# Patient Record
Sex: Female | Born: 1955 | Race: White | Hispanic: No | Marital: Married | Smoking: Former smoker
Health system: Southern US, Community
[De-identification: ages and names within clinical notes are randomized; demographics above are authoritative.]

## PROBLEM LIST (undated history)

## (undated) ENCOUNTER — Ambulatory Visit: Admission: EM | Payer: Medicare HMO | Source: Home / Self Care

## (undated) DIAGNOSIS — M5030 Other cervical disc degeneration, unspecified cervical region: Secondary | ICD-10-CM

## (undated) DIAGNOSIS — F32A Depression, unspecified: Secondary | ICD-10-CM

## (undated) DIAGNOSIS — I509 Heart failure, unspecified: Secondary | ICD-10-CM

## (undated) DIAGNOSIS — R1084 Generalized abdominal pain: Secondary | ICD-10-CM

## (undated) DIAGNOSIS — Z720 Tobacco use: Secondary | ICD-10-CM

## (undated) DIAGNOSIS — G5600 Carpal tunnel syndrome, unspecified upper limb: Secondary | ICD-10-CM

## (undated) DIAGNOSIS — I4891 Unspecified atrial fibrillation: Secondary | ICD-10-CM

## (undated) DIAGNOSIS — M5416 Radiculopathy, lumbar region: Secondary | ICD-10-CM

## (undated) DIAGNOSIS — N281 Cyst of kidney, acquired: Secondary | ICD-10-CM

## (undated) DIAGNOSIS — J310 Chronic rhinitis: Secondary | ICD-10-CM

## (undated) DIAGNOSIS — G8929 Other chronic pain: Secondary | ICD-10-CM

## (undated) DIAGNOSIS — J449 Chronic obstructive pulmonary disease, unspecified: Secondary | ICD-10-CM

## (undated) DIAGNOSIS — K219 Gastro-esophageal reflux disease without esophagitis: Secondary | ICD-10-CM

## (undated) DIAGNOSIS — M51379 Other intervertebral disc degeneration, lumbosacral region without mention of lumbar back pain or lower extremity pain: Secondary | ICD-10-CM

## (undated) DIAGNOSIS — K469 Unspecified abdominal hernia without obstruction or gangrene: Secondary | ICD-10-CM

## (undated) DIAGNOSIS — M545 Low back pain, unspecified: Secondary | ICD-10-CM

## (undated) DIAGNOSIS — R609 Edema, unspecified: Secondary | ICD-10-CM

## (undated) DIAGNOSIS — F419 Anxiety disorder, unspecified: Secondary | ICD-10-CM

## (undated) DIAGNOSIS — M6281 Muscle weakness (generalized): Secondary | ICD-10-CM

## (undated) DIAGNOSIS — J111 Influenza due to unidentified influenza virus with other respiratory manifestations: Secondary | ICD-10-CM

## (undated) DIAGNOSIS — F329 Major depressive disorder, single episode, unspecified: Secondary | ICD-10-CM

## (undated) DIAGNOSIS — K2970 Gastritis, unspecified, without bleeding: Secondary | ICD-10-CM

## (undated) DIAGNOSIS — R8290 Unspecified abnormal findings in urine: Secondary | ICD-10-CM

## (undated) DIAGNOSIS — N3941 Urge incontinence: Secondary | ICD-10-CM

## (undated) DIAGNOSIS — E039 Hypothyroidism, unspecified: Secondary | ICD-10-CM

## (undated) DIAGNOSIS — I10 Essential (primary) hypertension: Secondary | ICD-10-CM

## (undated) DIAGNOSIS — R062 Wheezing: Secondary | ICD-10-CM

## (undated) DIAGNOSIS — E669 Obesity, unspecified: Secondary | ICD-10-CM

## (undated) DIAGNOSIS — N200 Calculus of kidney: Secondary | ICD-10-CM

## (undated) DIAGNOSIS — R319 Hematuria, unspecified: Secondary | ICD-10-CM

## (undated) DIAGNOSIS — G8918 Other acute postprocedural pain: Secondary | ICD-10-CM

## (undated) DIAGNOSIS — D649 Anemia, unspecified: Secondary | ICD-10-CM

## (undated) DIAGNOSIS — Z9989 Dependence on other enabling machines and devices: Secondary | ICD-10-CM

## (undated) DIAGNOSIS — R0600 Dyspnea, unspecified: Secondary | ICD-10-CM

## (undated) DIAGNOSIS — K439 Ventral hernia without obstruction or gangrene: Secondary | ICD-10-CM

## (undated) DIAGNOSIS — M5137 Other intervertebral disc degeneration, lumbosacral region: Secondary | ICD-10-CM

## (undated) DIAGNOSIS — Z9981 Dependence on supplemental oxygen: Secondary | ICD-10-CM

## (undated) HISTORY — DX: Unspecified abdominal hernia without obstruction or gangrene: K46.9

## (undated) HISTORY — DX: Chronic rhinitis: J31.0

## (undated) HISTORY — DX: Other cervical disc degeneration, unspecified cervical region: M50.30

## (undated) HISTORY — DX: Major depressive disorder, single episode, unspecified: F32.9

## (undated) HISTORY — DX: Tobacco use: Z72.0

## (undated) HISTORY — DX: Hematuria, unspecified: R31.9

## (undated) HISTORY — DX: Influenza due to unidentified influenza virus with other respiratory manifestations: J11.1

## (undated) HISTORY — DX: Anxiety disorder, unspecified: F41.9

## (undated) HISTORY — DX: Urge incontinence: N39.41

## (undated) HISTORY — DX: Unspecified abnormal findings in urine: R82.90

## (undated) HISTORY — DX: Hypothyroidism, unspecified: E03.9

## (undated) HISTORY — DX: Low back pain, unspecified: M54.50

## (undated) HISTORY — PX: OTHER SURGICAL HISTORY: SHX169

## (undated) HISTORY — DX: Generalized abdominal pain: R10.84

## (undated) HISTORY — DX: Radiculopathy, lumbar region: M54.16

## (undated) HISTORY — DX: Obesity, unspecified: E66.9

## (undated) HISTORY — PX: ABDOMINAL HYSTERECTOMY: SHX81

## (undated) HISTORY — DX: Anemia, unspecified: D64.9

## (undated) HISTORY — DX: Edema, unspecified: R60.9

## (undated) HISTORY — DX: Depression, unspecified: F32.A

## (undated) HISTORY — PX: TONSILLECTOMY: SUR1361

## (undated) HISTORY — DX: Dependence on supplemental oxygen: Z99.81

## (undated) HISTORY — DX: Other acute postprocedural pain: G89.18

## (undated) HISTORY — DX: Cyst of kidney, acquired: N28.1

## (undated) HISTORY — DX: Gastritis, unspecified, without bleeding: K29.70

## (undated) HISTORY — DX: Carpal tunnel syndrome, unspecified upper limb: G56.00

## (undated) HISTORY — DX: Calculus of kidney: N20.0

## (undated) HISTORY — DX: Muscle weakness (generalized): M62.81

## (undated) HISTORY — DX: Other chronic pain: G89.29

## (undated) HISTORY — DX: Low back pain: M54.5

## (undated) HISTORY — PX: TUBAL LIGATION: SHX77

## (undated) HISTORY — DX: Ventral hernia without obstruction or gangrene: K43.9

---

## 2006-02-02 ENCOUNTER — Ambulatory Visit: Payer: Self-pay | Admitting: Obstetrics and Gynecology

## 2006-02-09 ENCOUNTER — Ambulatory Visit: Payer: Self-pay | Admitting: Obstetrics and Gynecology

## 2006-02-23 ENCOUNTER — Ambulatory Visit: Payer: Self-pay | Admitting: Obstetrics and Gynecology

## 2006-06-13 ENCOUNTER — Emergency Department: Payer: Self-pay | Admitting: Emergency Medicine

## 2006-08-24 IMAGING — US ULTRASOUND LEFT BREAST
1 series · 17 of 19 positions shown · non-contrast
Comparison: none

REASON FOR EXAM: Density  US PRN
COMMENTS:

[Series 1: ultrasound left breast · 17 of 19 slices shown]
[im 1/19]
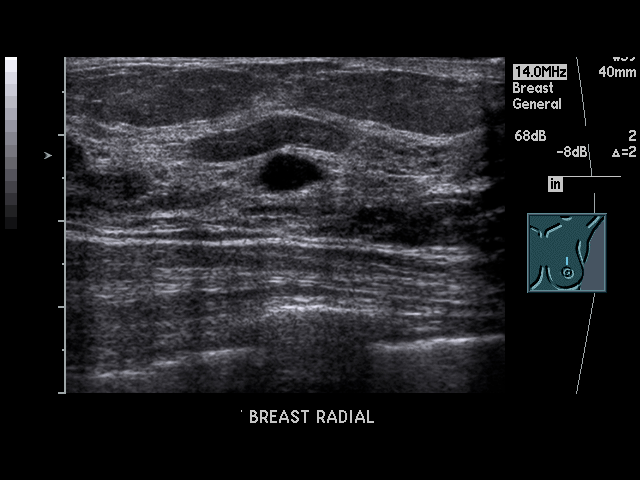
[im 2/19]
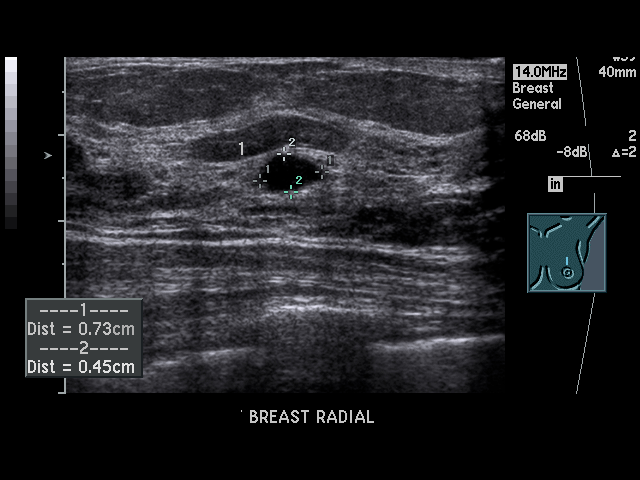
[im 3/19]
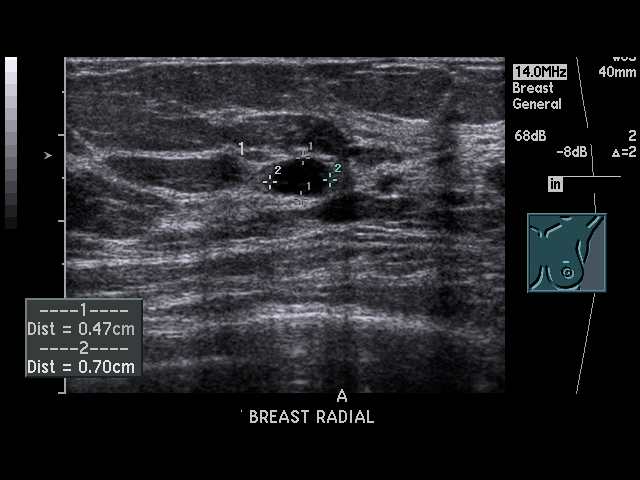
[im 4/19]
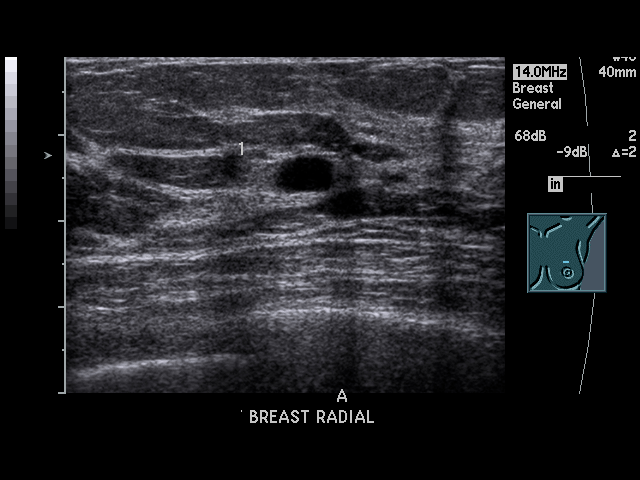
[im 6/19]
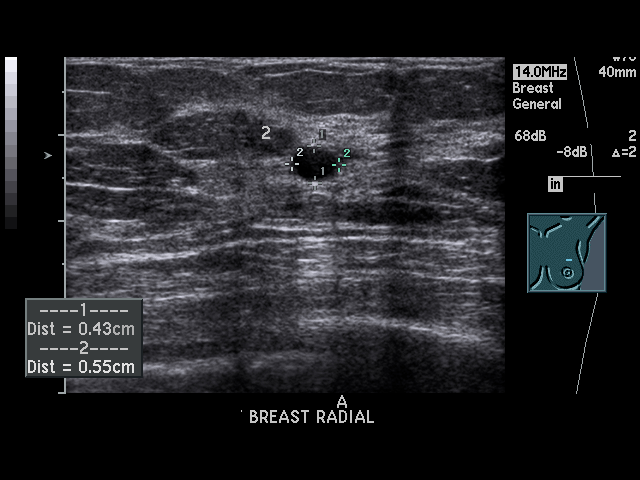
[im 7/19]
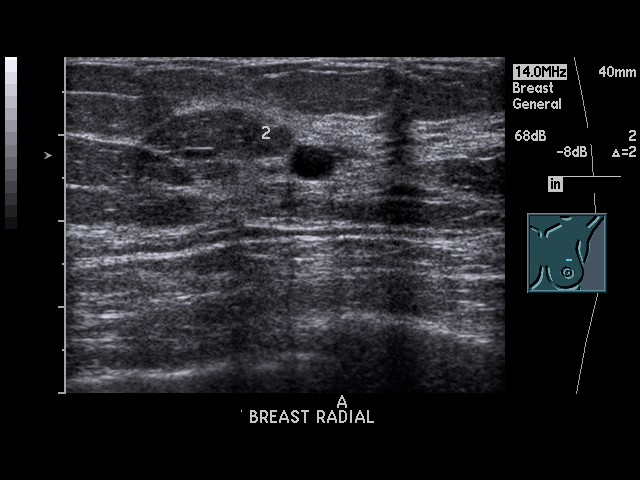
[im 8/19]
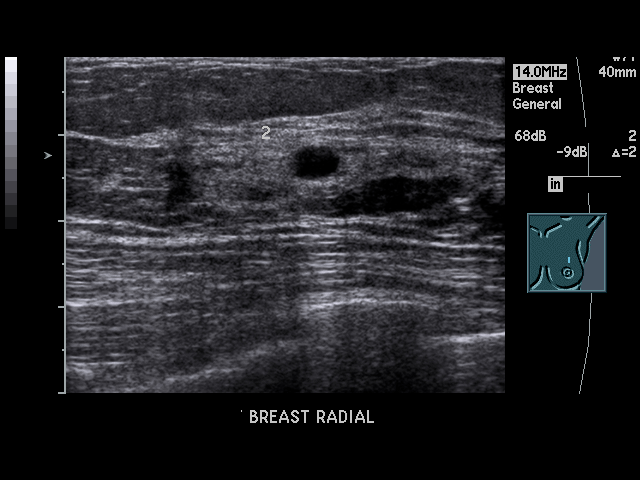
[im 9/19]
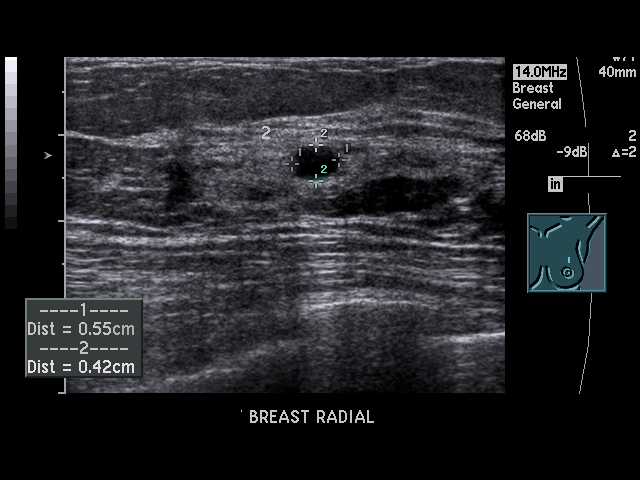
[im 10/19]
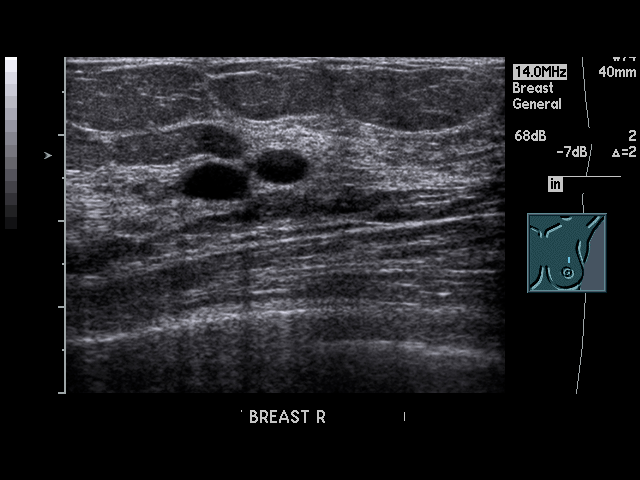
[im 11/19]
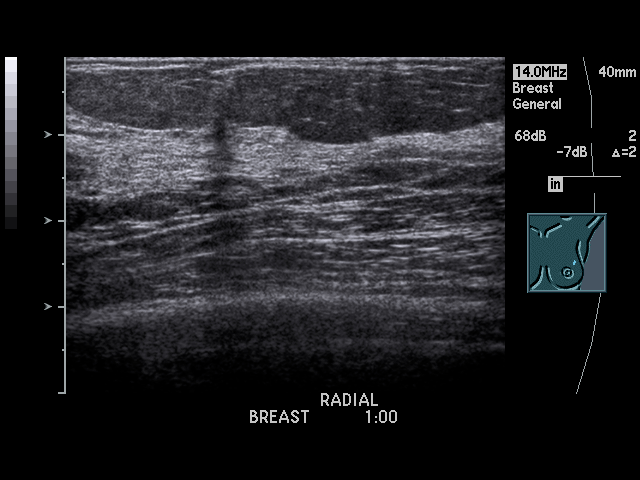
[im 12/19]
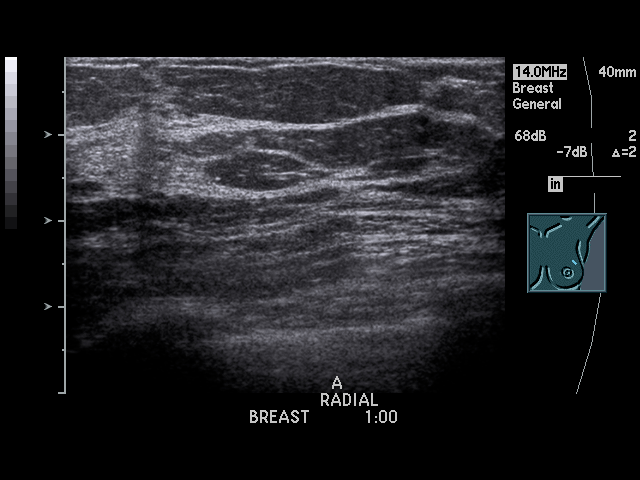
[im 13/19]
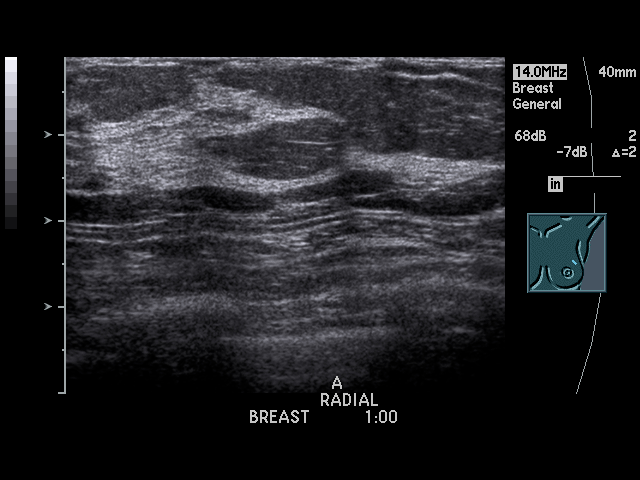
[im 14/19]
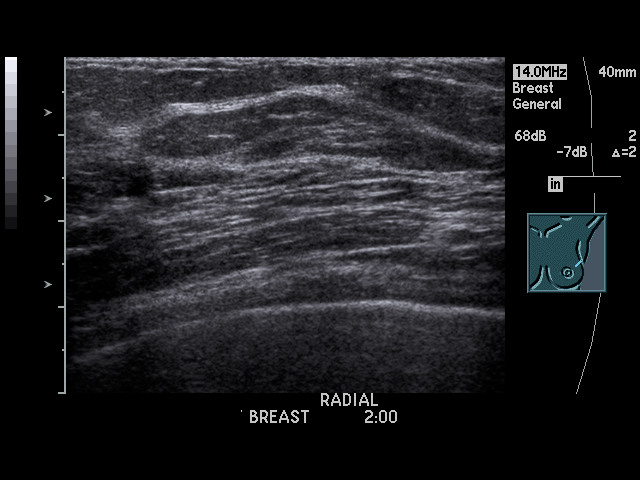
[im 16/19]
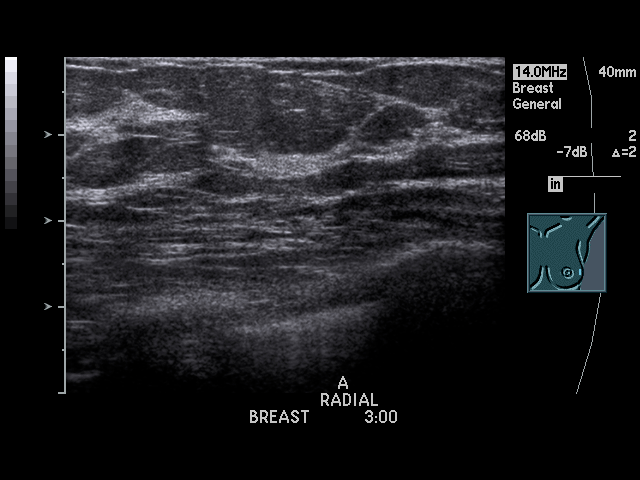
[im 17/19]
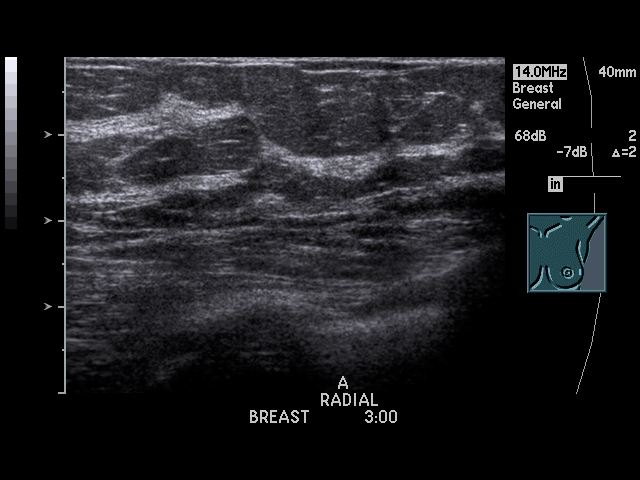
[im 18/19]
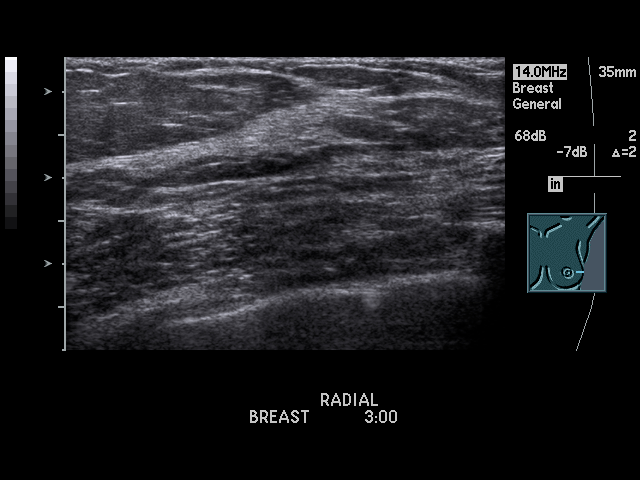
[im 19/19]
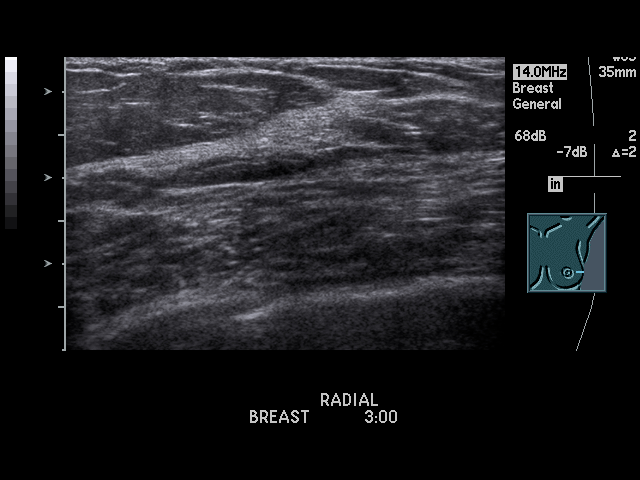

[17 of 19 positions shown; findings below may reference images not displayed]

PROCEDURE:     US  - US BREAST LEFT  - February 09, 2006  [DATE]

RESULT:          On the patient's screening study, increased conspicuity of
nodularity was noted on the LEFT in the upper outer quadrant.  The patient
was asked to return for supplemental mammographic views and ultrasound.
Ultrasound at the 12 o'clock region reveals two anechoic structures with
enhanced through transmission compatible with benign cysts.
IMPRESSION: There are benign-appearing cystic structures at the 12
o'clock position on the LEFT.  The largest measures just over 7 mm in
diameter, while the adjacent smaller measures approximately 5 mm in
diameter.  Please see the dictation for the supplemental mammographic views
of this same day for final recommendations and BI-RADS classification.

## 2006-12-26 IMAGING — CR DG CHEST 2V
1 series · 3 of 3 positions shown · non-contrast
Comparison: none

REASON FOR EXAM: wheezing/cough
COMMENTS:

PROCEDURE:     DXR - DXR CHEST PA (OR AP) AND LATERAL  - June 13, 2006  [DATE]
RESULT:     The lungs are hyperinflated with hemidiaphragm flattening. There
is no focal infiltrate. The heart is not enlarged and the pulmonary
vascularity is not engorged. There is no pleural effusion.

[Series 1: view not recorded · 0.17mm/px · 3 of 3 slices shown]
[im 1/3]
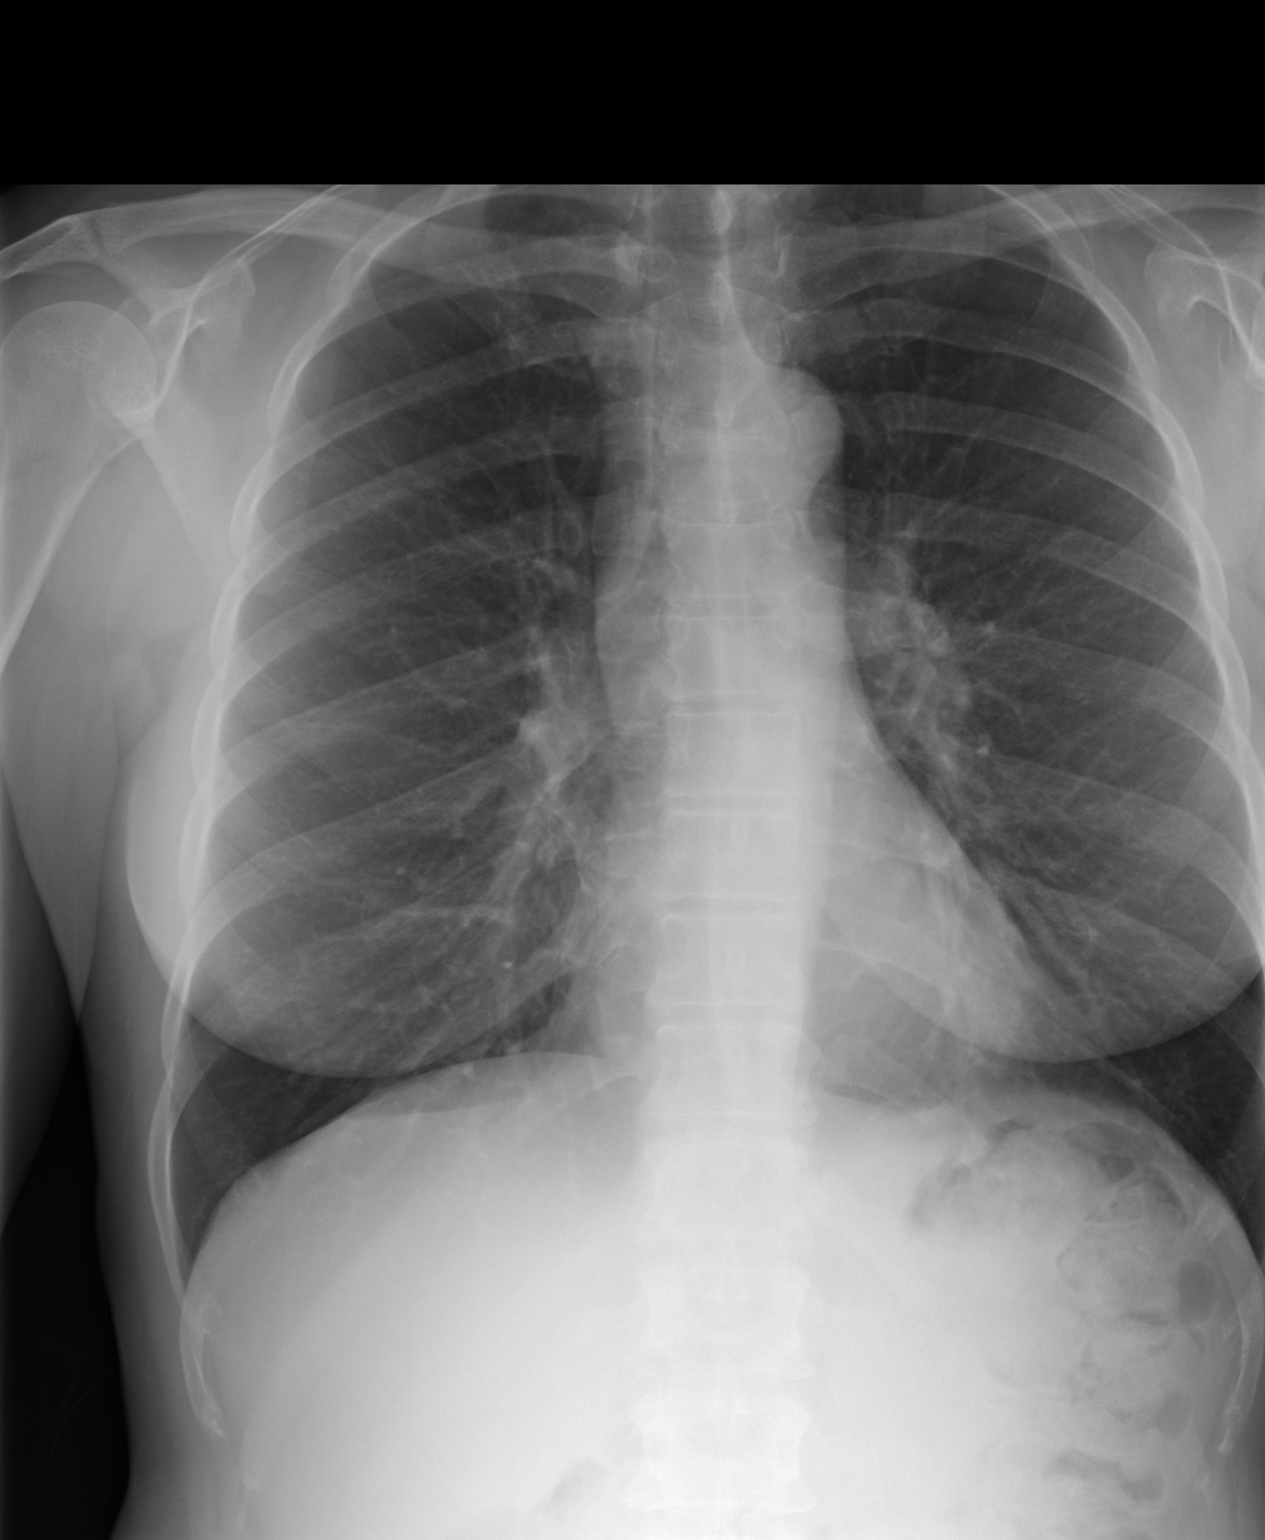
[im 2/3]
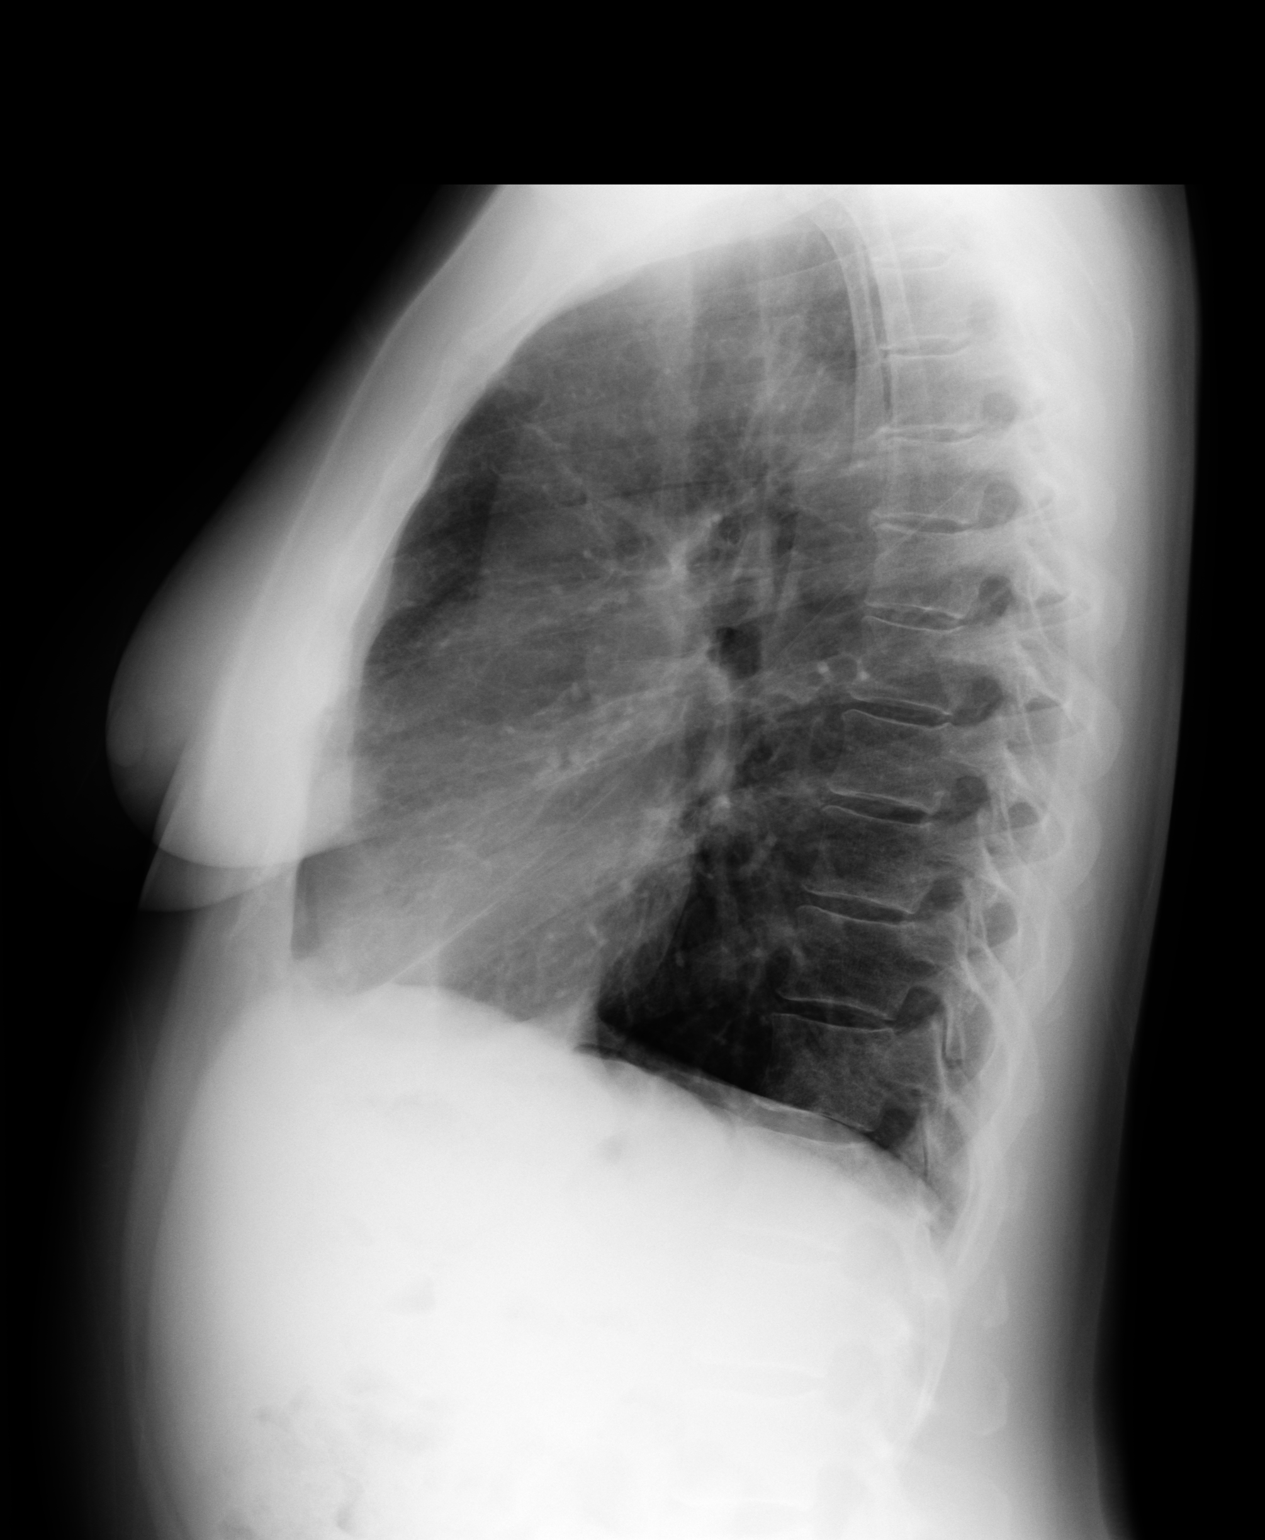
[im 3/3]
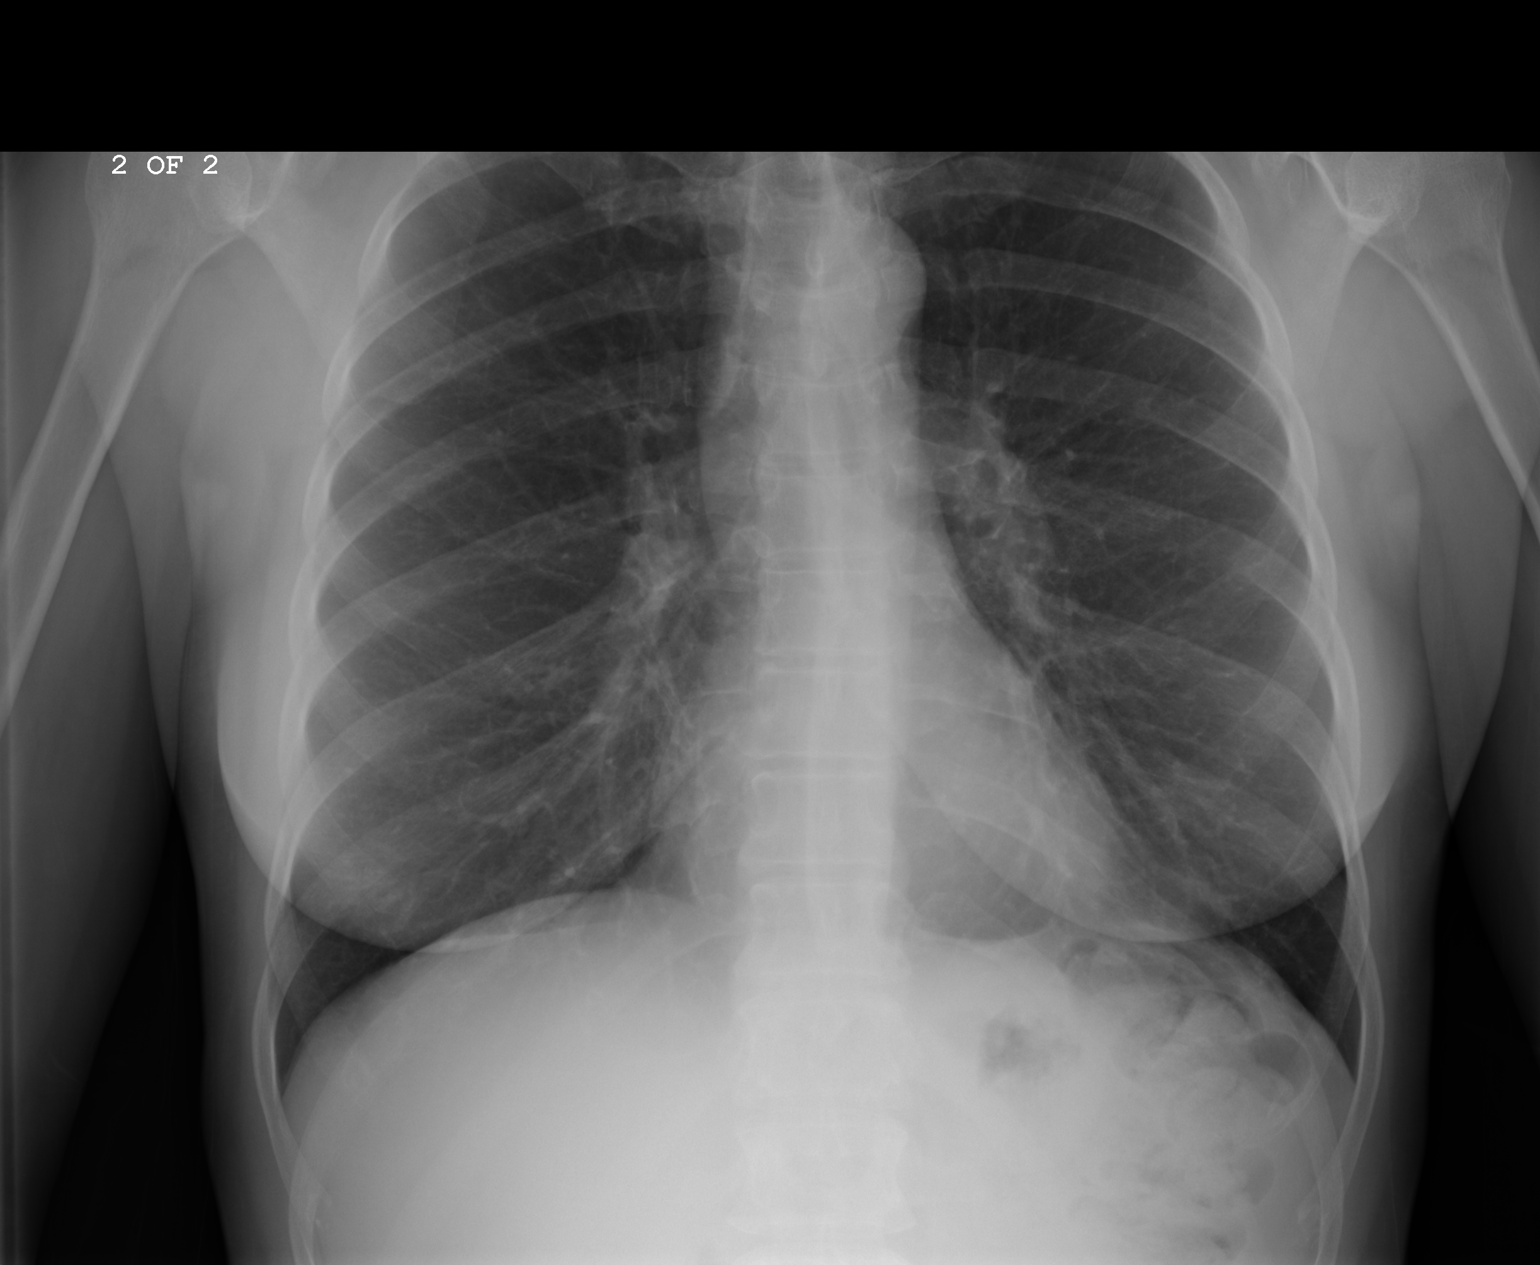

[3 of 3 positions shown; findings below may reference images not displayed]

IMPRESSION: 1)There is hyperinflation consistent with COPD or reactive airway disease. I
see no evidence of CHF nor of pneumonia.

## 2007-01-31 ENCOUNTER — Emergency Department: Payer: Self-pay | Admitting: Unknown Physician Specialty

## 2007-01-31 ENCOUNTER — Other Ambulatory Visit: Payer: Self-pay

## 2007-02-22 ENCOUNTER — Ambulatory Visit: Payer: Self-pay | Admitting: Psychiatry

## 2007-06-03 ENCOUNTER — Emergency Department: Payer: Self-pay | Admitting: Emergency Medicine

## 2007-06-28 ENCOUNTER — Ambulatory Visit: Payer: Self-pay | Admitting: Internal Medicine

## 2007-08-15 IMAGING — CR DG CHEST 2V
1 series · 2 of 2 positions shown · non-contrast
Comparison: none

REASON FOR EXAM: weakness
COMMENTS:

[Series 1: view not recorded · 0.17mm/px · 2 of 2 slices shown]
[im 1/2]
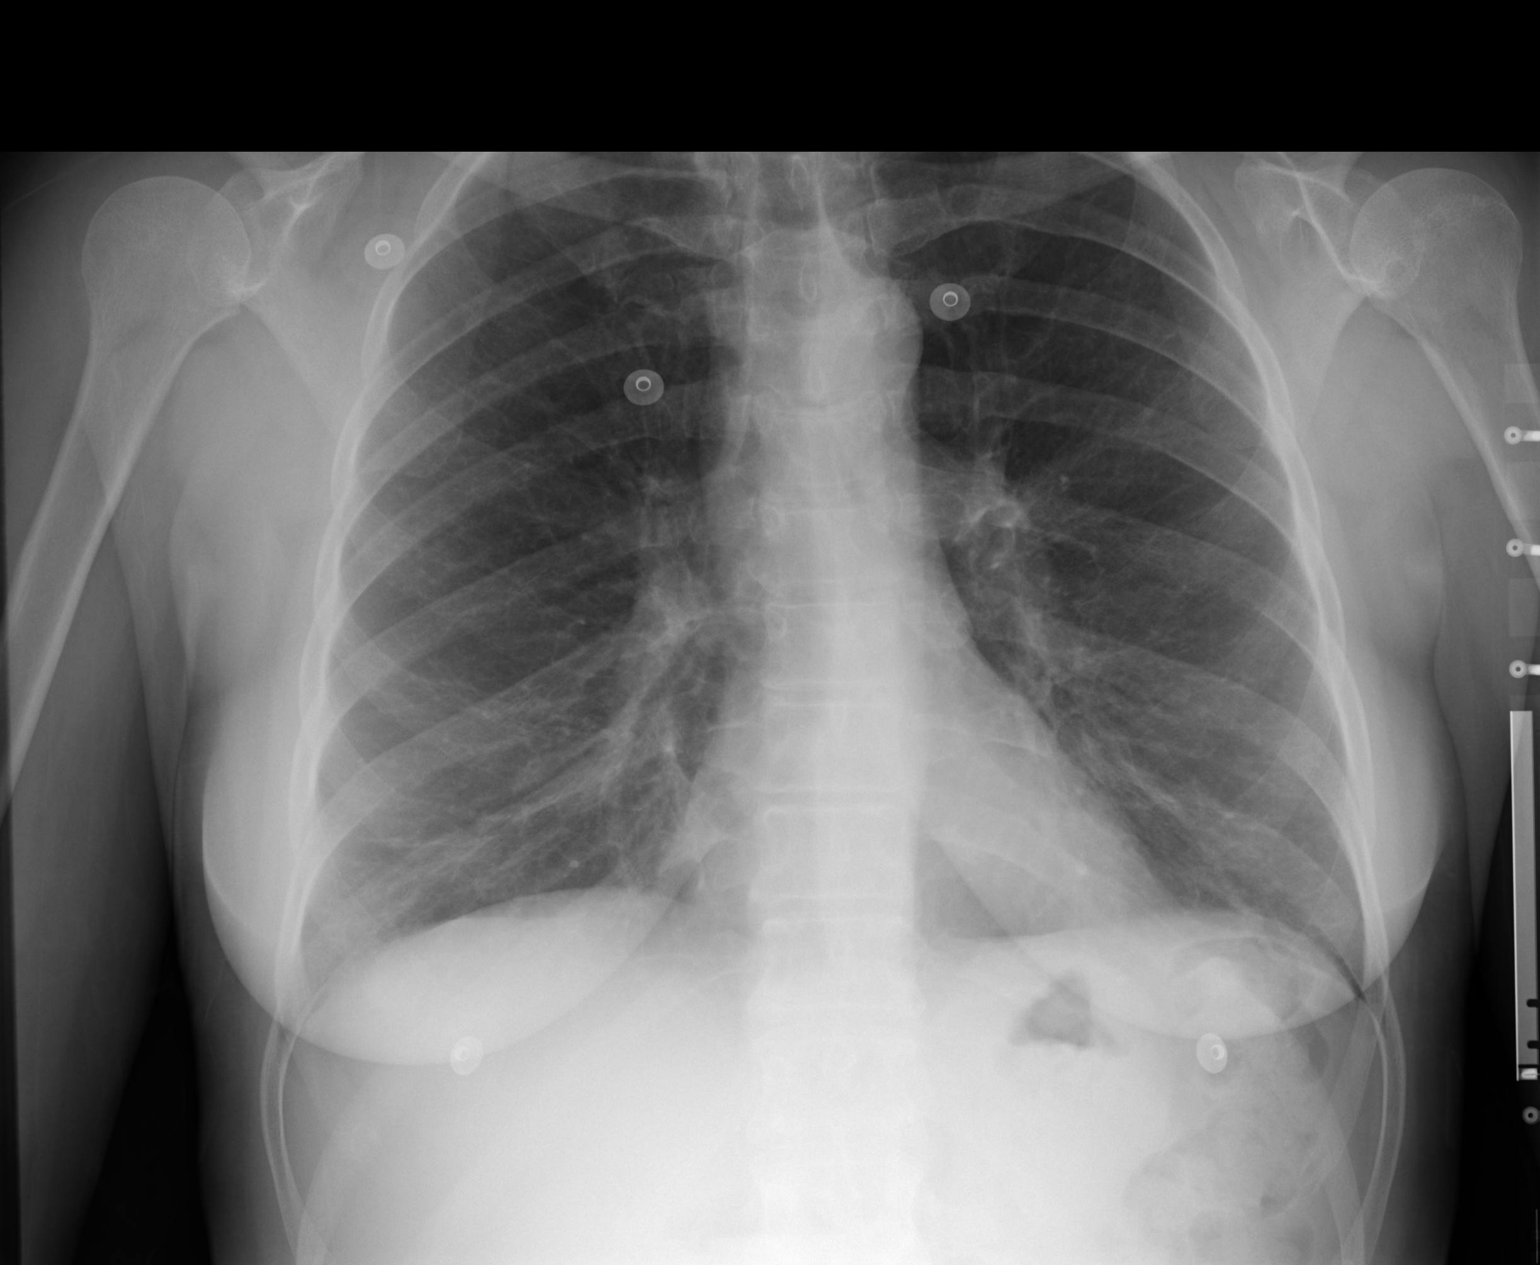
[im 2/2]
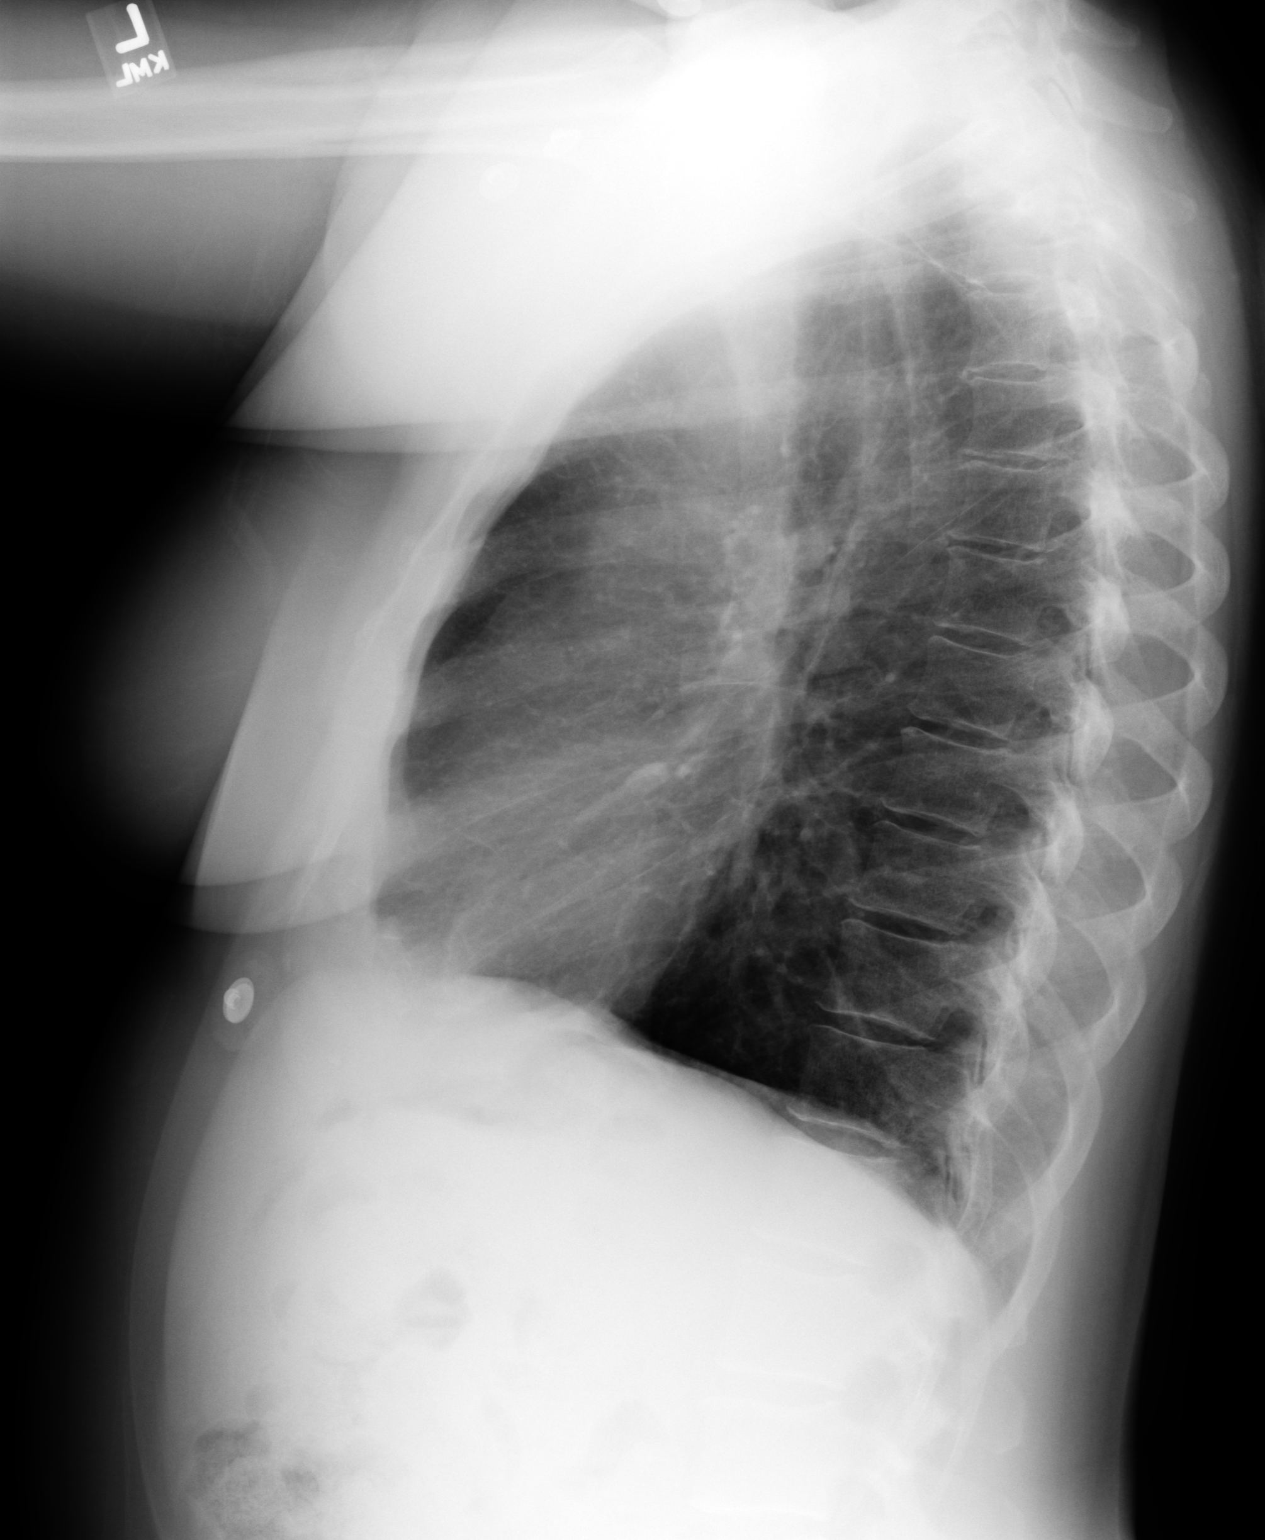

[2 of 2 positions shown; findings below may reference images not displayed]

PROCEDURE:     DXR - DXR CHEST PA (OR AP) AND LATERAL  - January 31, 2007  [DATE]

RESULT:     Comparison is made to the prior exam of 06/13/06. The lung fields
are clear.  The heart, mediastinal and osseous structures show no
significant abnormalities.  The chest does appear mildly hyperexpanded
suspicious for a history of COPD or asthma.
IMPRESSION: 1. The lung fields are clear of infiltrate.
2. The heart size is normal.
3. The chest is mildly hyperexpanded.

## 2007-08-15 IMAGING — CT CT HEAD WITHOUT CONTRAST
2 series · 16 of 30 positions shown, 20 images · non-contrast
Comparison: none

REASON FOR EXAM: ha weakness
COMMENTS:

[Series 2: without · axial · non-contrast · 0.39mm/px · z∈[-160,-40]mm · 13 of 29 slices shown, 17 images]
[im 3/29  brain]
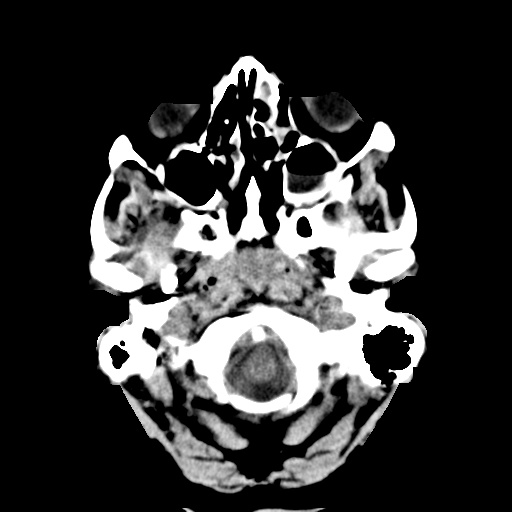
[im 3/29  bone]
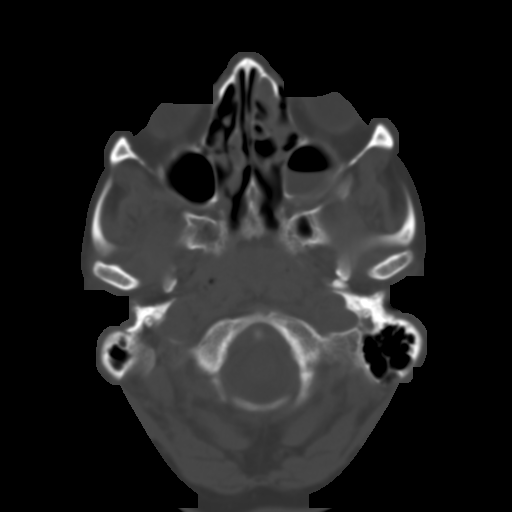
[im 5/29  brain]
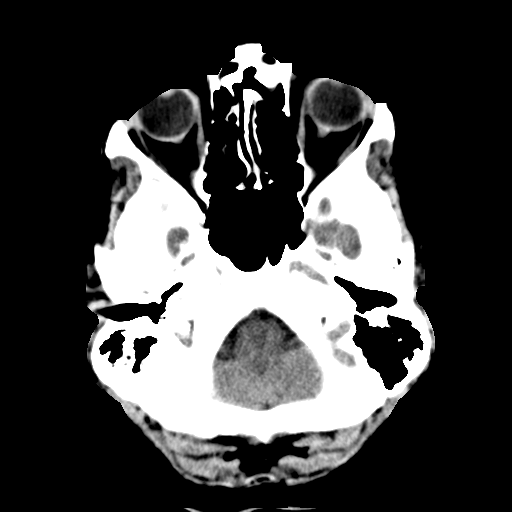
[im 7/29  brain]
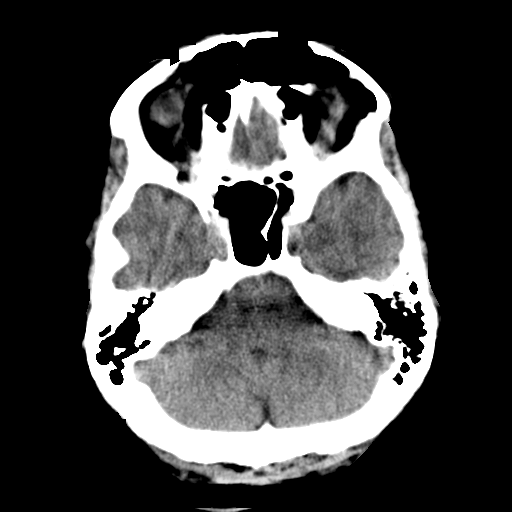
[im 9/29  brain]
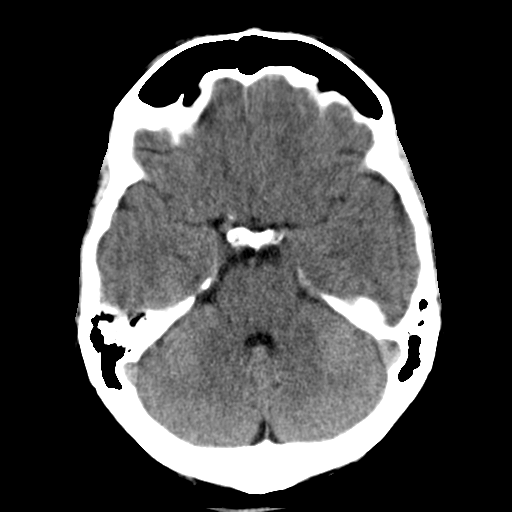
[im 11/29  brain]
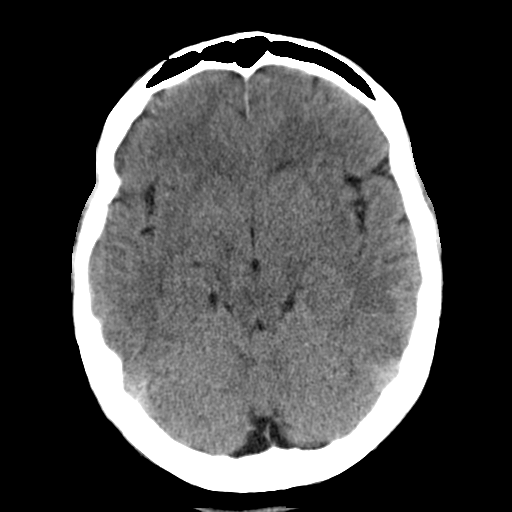
[im 11/29  bone]
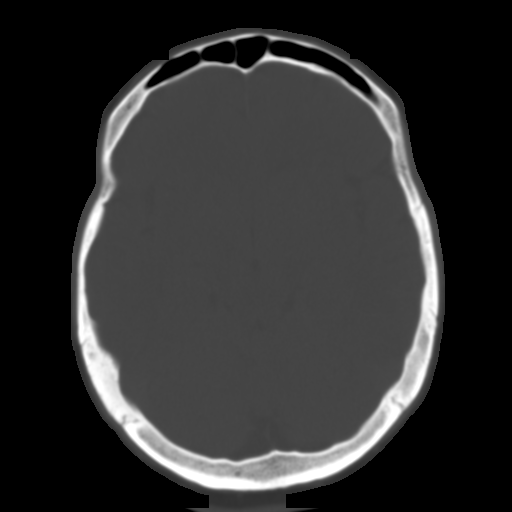
[im 13/29  brain]
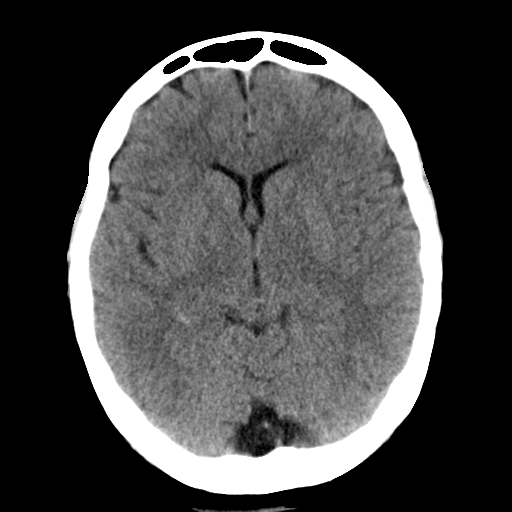
[im 15/29  brain]
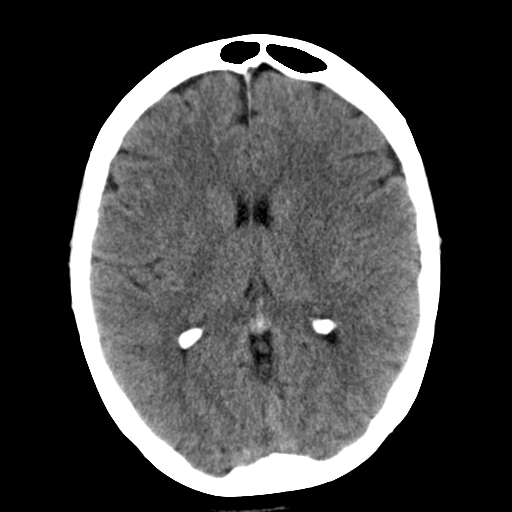
[im 17/29  brain]
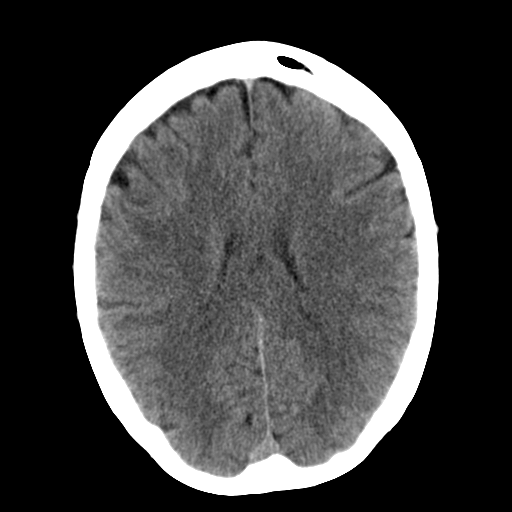
[im 19/29  brain]
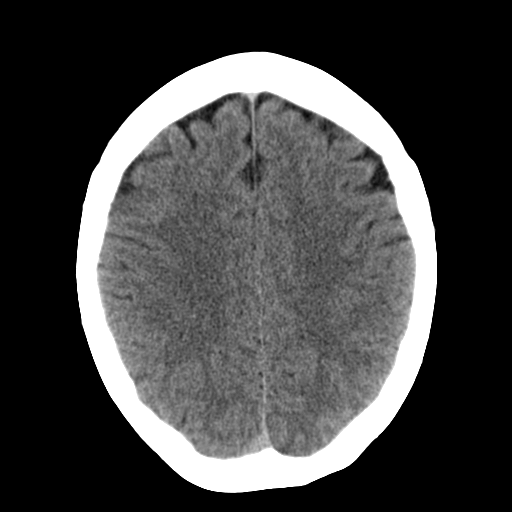
[im 19/29  bone]
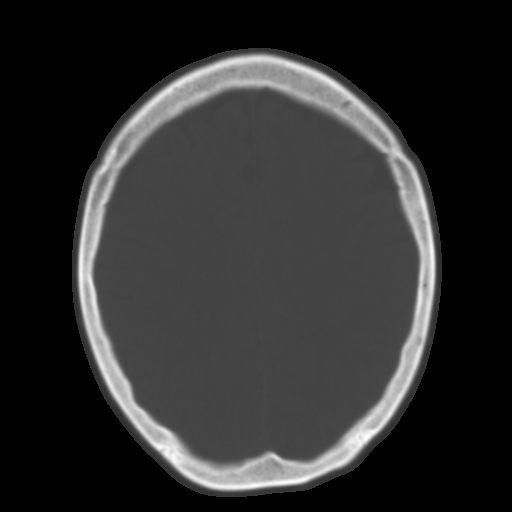
[im 21/29  brain]
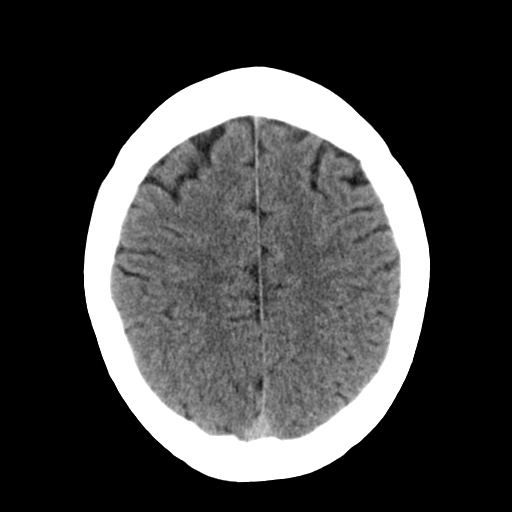
[im 23/29  brain]
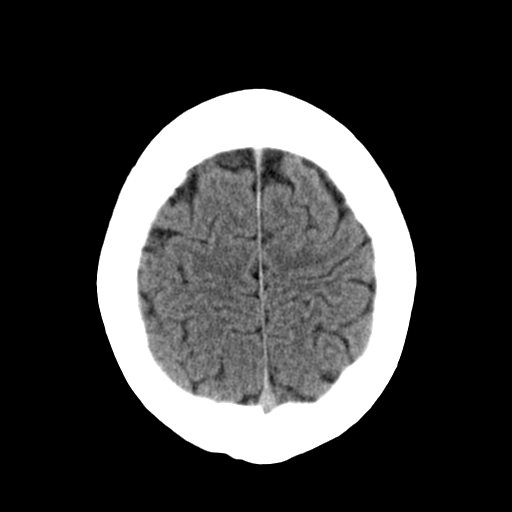
[im 25/29  brain]
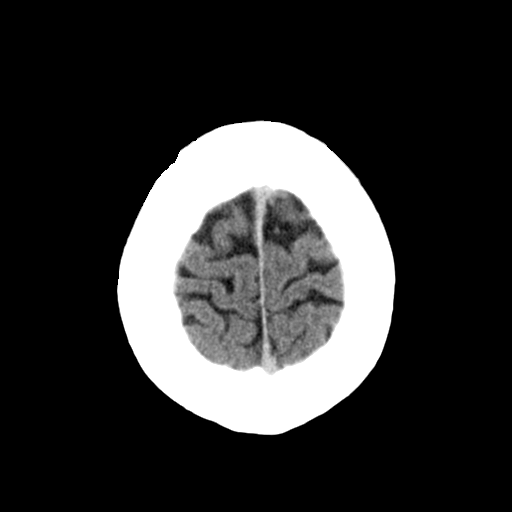
[im 27/29  brain]
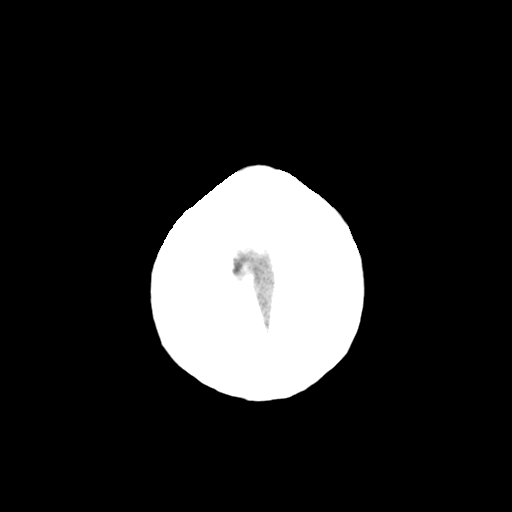
[im 27/29  bone]
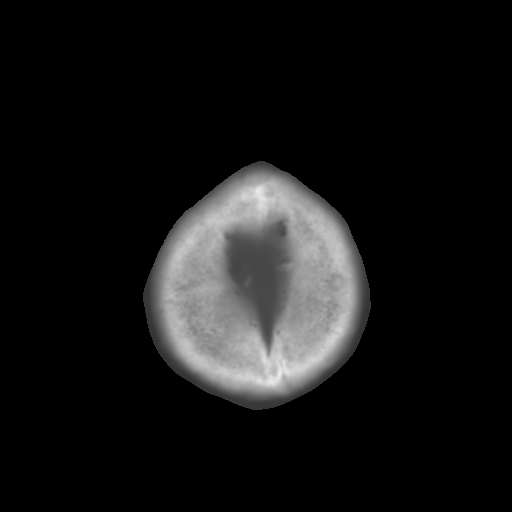

[Series 3: bone · axial · 0.39mm/px · z∈[-160,-120]mm · 3 of 29 slices shown]
[im 3/29  bone]
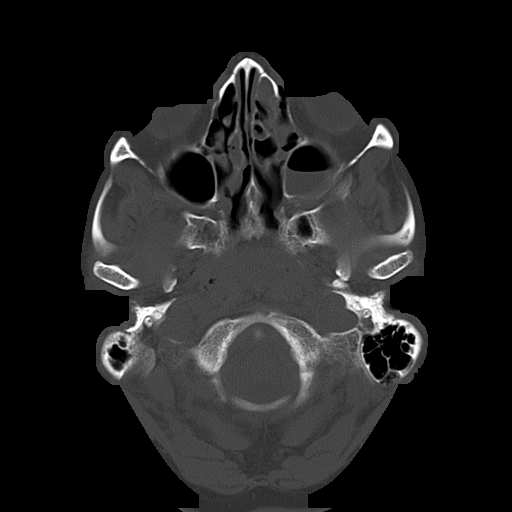
[im 7/29  bone]
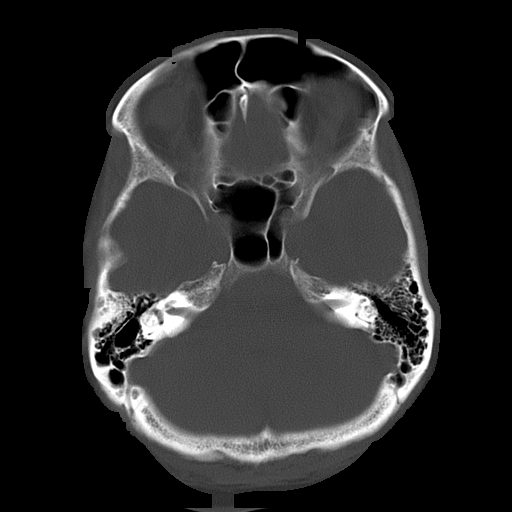
[im 11/29  bone]
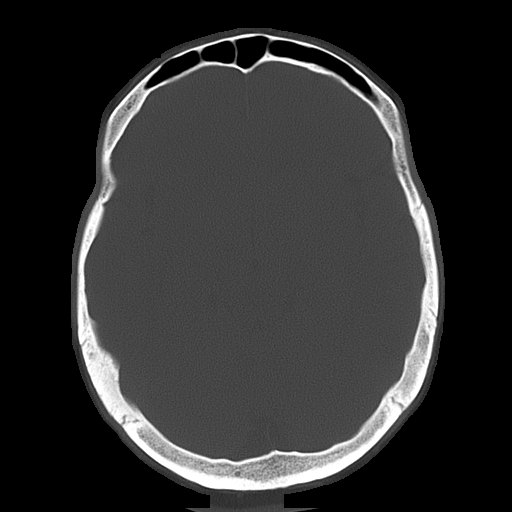

[16 of 30 positions shown; findings below may reference images not displayed]

PROCEDURE:     CT  - CT HEAD WITHOUT CONTRAST  - January 31, 2007  [DATE]

RESULT:     Emergent noncontrast CT of the brain is performed. The patient
has no prior study for comparison. Air-fluid level is present in left
maxillary sinus. There is partial opacification of the ethmoid sinuses. The
remaining sinuses and mastoid air cells included on this study appear
normally aerated. The ventricles and sulci are normal. There is no
hemorrhage, mass, mass-effect or midline shift. A megacisterna magna is
present which is a normal variant.
IMPRESSION: Left maxillary and probable ethmoid sinusitis. No acute
intracranial abnormality.

## 2007-08-17 ENCOUNTER — Ambulatory Visit: Payer: Self-pay | Admitting: Internal Medicine

## 2007-08-18 ENCOUNTER — Ambulatory Visit: Payer: Self-pay | Admitting: Internal Medicine

## 2007-08-22 ENCOUNTER — Ambulatory Visit: Payer: Self-pay | Admitting: Internal Medicine

## 2007-09-06 IMAGING — MR MR HEAD WO/W CM - MRA HEAD W/O CM
7 of 10 series · 25 of 48 positions shown · non-contrast
Comparison: none

REASON FOR EXAM: HA syncope
COMMENTS:

[Series 2: T1 · axial · 10.0mm · 0.55mm/px · z∈[+0,+110]mm · 4 of 24 slices shown (1 of 3)]
[im 1/24]
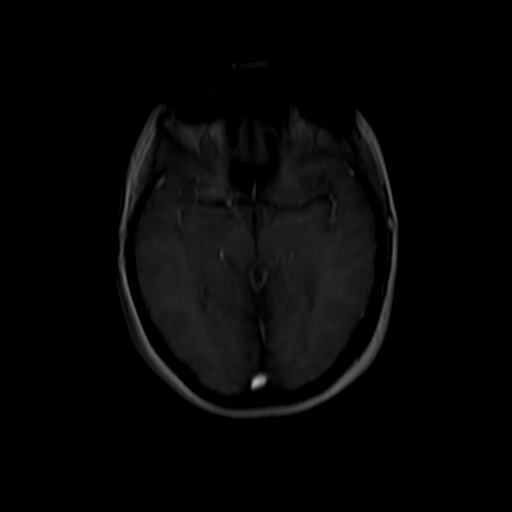
[im 8/24]
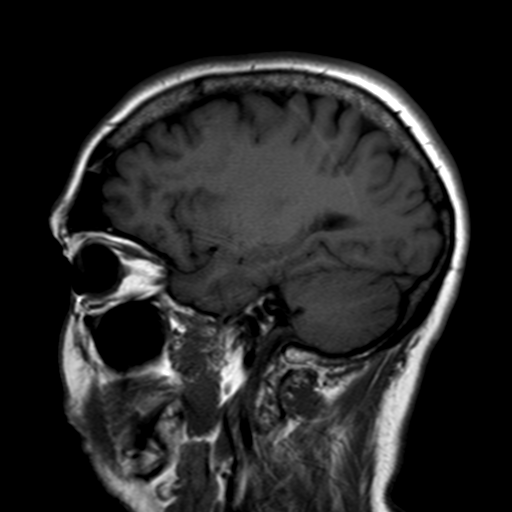
[im 16/24]
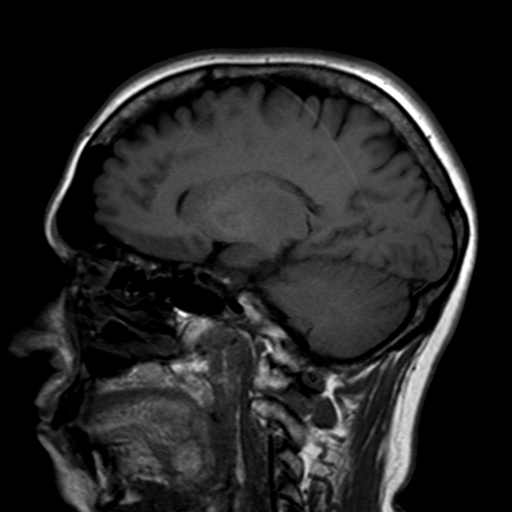
[im 24/24]
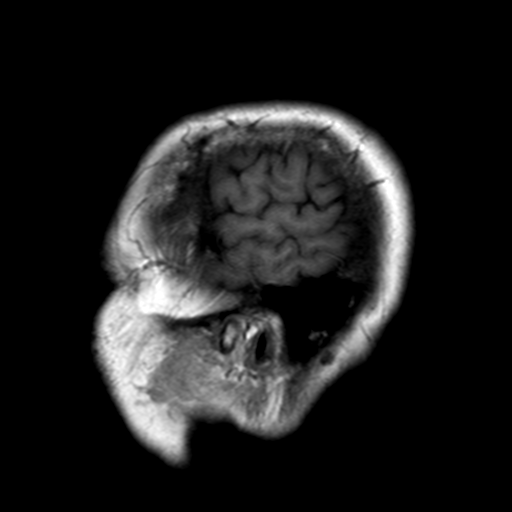

[Series 11: T2 · axial · 5.0mm · 0.60mm/px · z∈[-59,+108]mm · 4 of 24 slices shown]
[im 1/24]
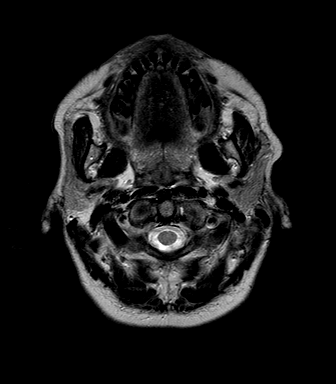
[im 8/24]
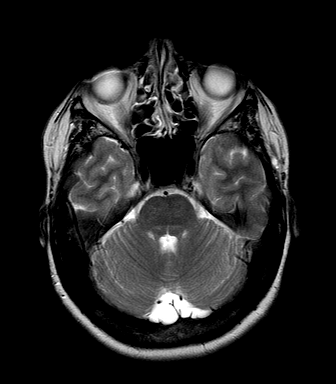
[im 16/24]
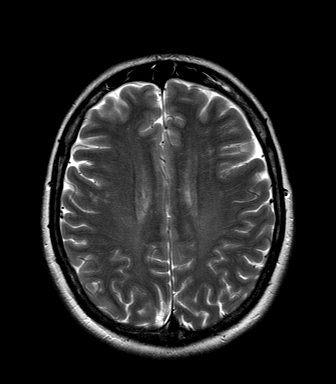
[im 24/24]
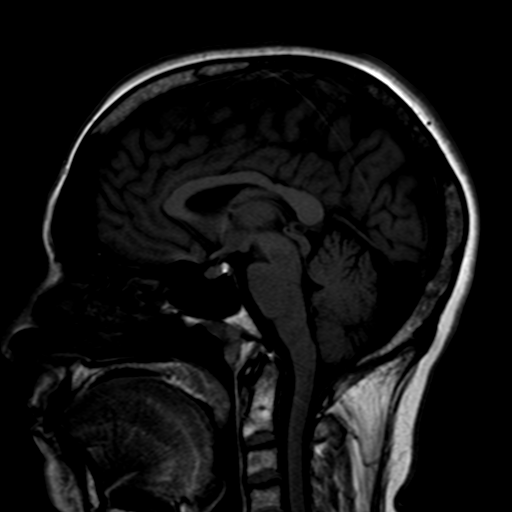

[Series 13: FLAIR · axial · 5.0mm · 0.45mm/px · z∈[-58,+108]mm · 4 of 24 slices shown]
[im 1/24]
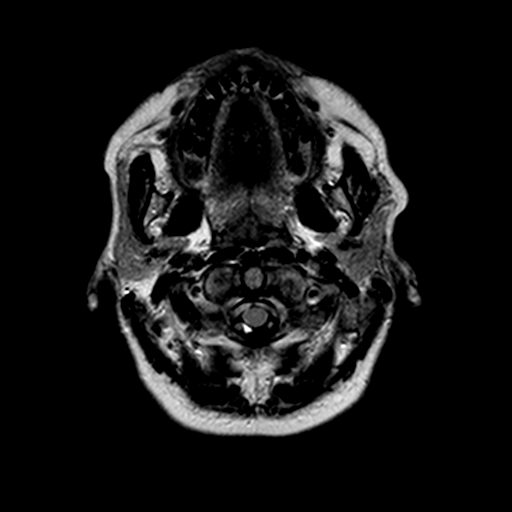
[im 8/24]
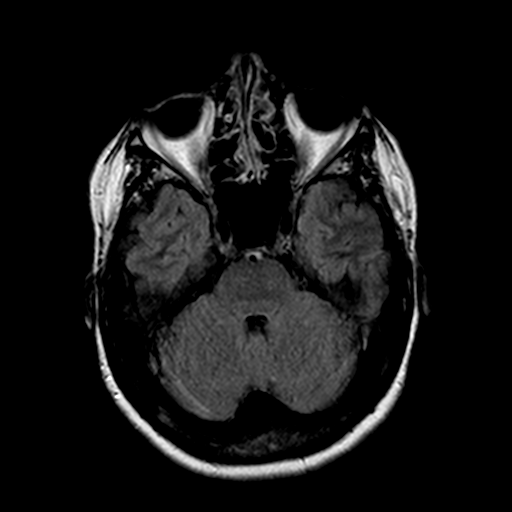
[im 16/24]
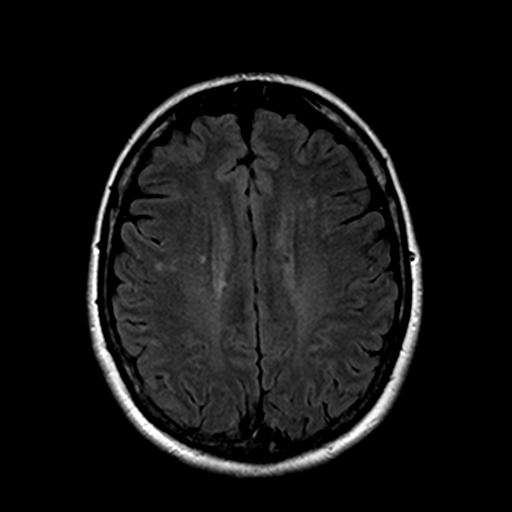
[im 24/24]
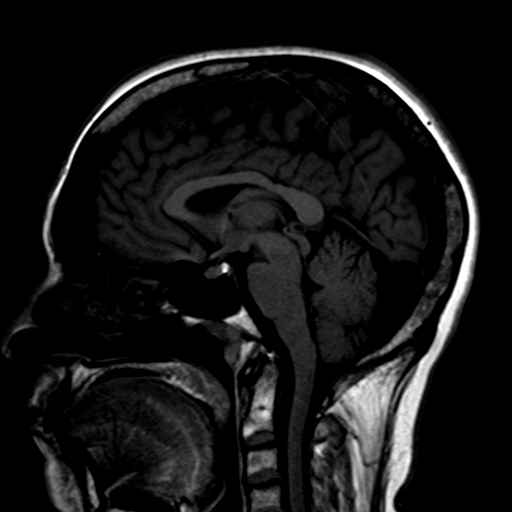

[Series 22: T1 · coronal · 5.0mm · 0.43mm/px · 4 of 28 slices shown (2 of 3)]
[im 1/28]
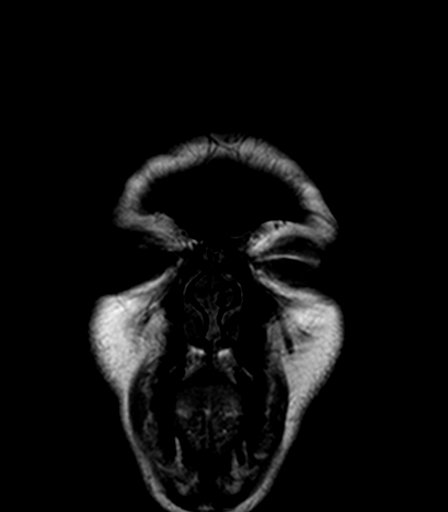
[im 10/28]
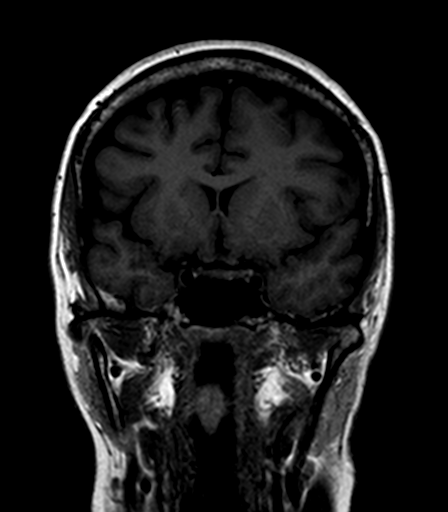
[im 19/28]
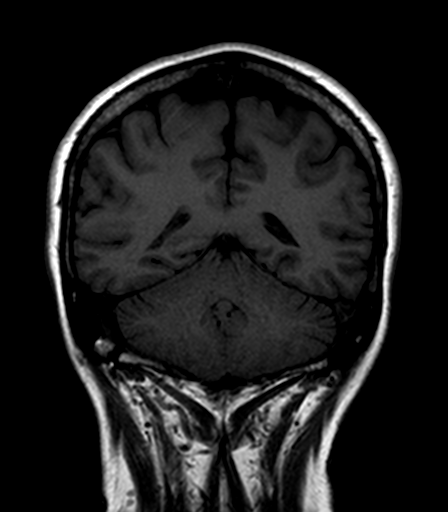
[im 28/28]
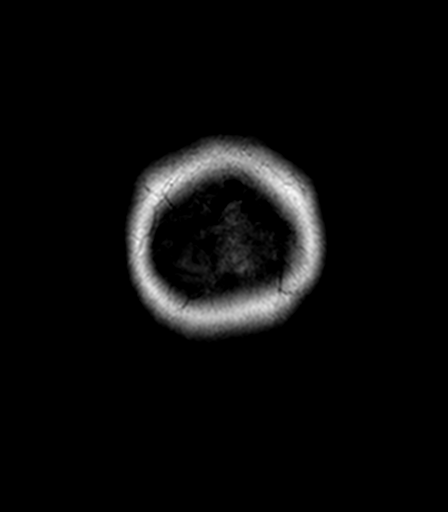

[Series 25: T1 · coronal · 5.0mm · 0.43mm/px · 3 of 28 slices shown (3 of 3)]
[im 1/28]
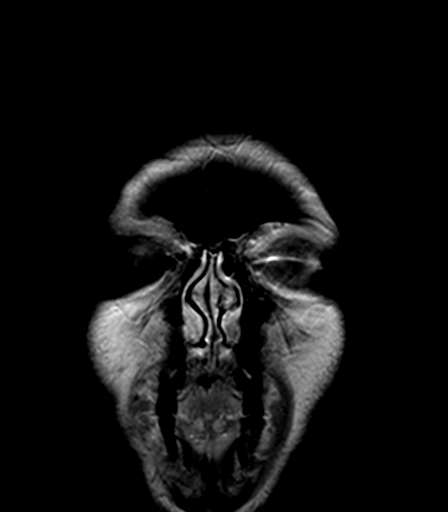
[im 10/28]
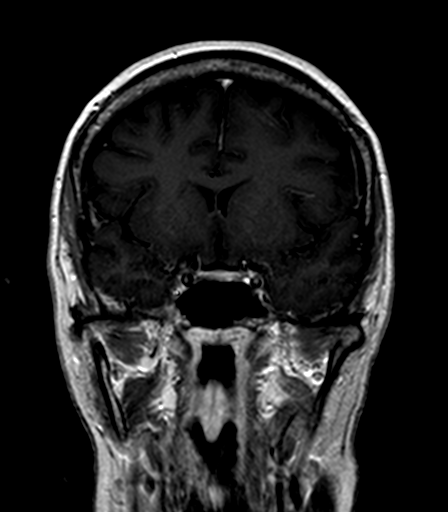
[im 19/28]
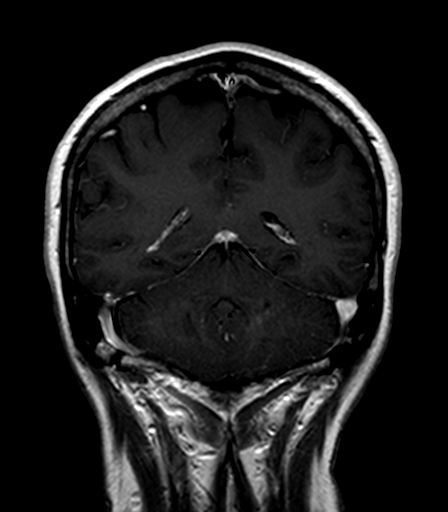

[Series 5007: DWI · axial · 5.0mm · 1.80mm/px · z∈[-52,+108]mm · 3 of 22 slices shown]
[im 1/22]
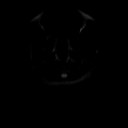
[im 11/22]
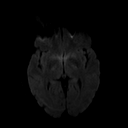
[im 22/22]
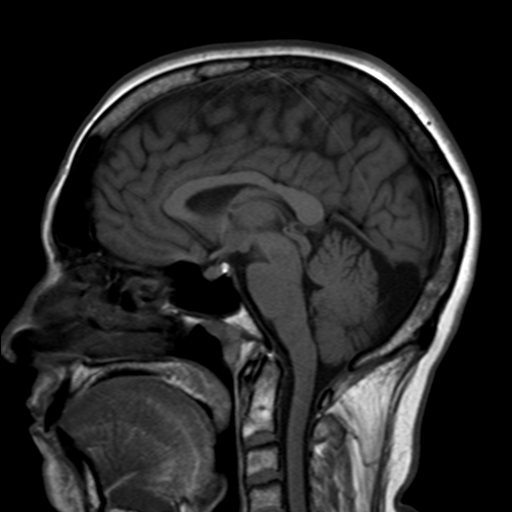

[Series 5009: ADC · axial · 5.0mm · 1.80mm/px · z∈[-52,+108]mm · 3 of 22 slices shown]
[im 1/22]
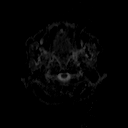
[im 11/22]
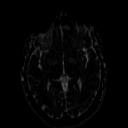
[im 22/22]
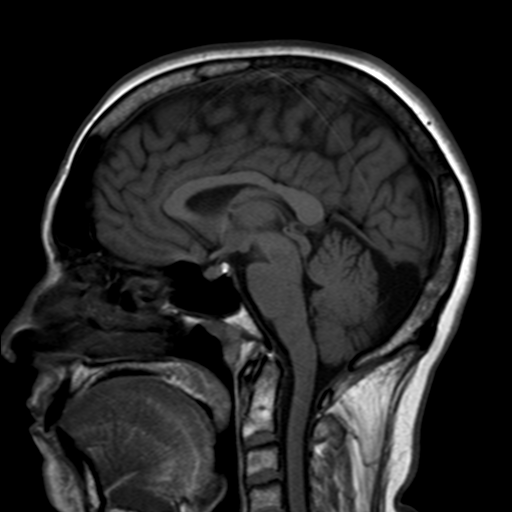

[25 of 48 positions shown; findings below may reference images not displayed]

PROCEDURE:     MR  - MR BRAIN W/WO AND MRA BRAIN  WO  - February 22, 2007  [DATE]

RESULT:       Evaluation with diffusion-weighted images demonstrates no
signal abnormalities.  Evaluation of T2 weighted imaging demonstrates what
appears to be a mega cisterna magna versus possibly an arachnoid cyst. No
further evidence of intraaxial or extraaxial fluid collections is
identified.  There does not appear to be evidence of regions of abnormal
parenchymal enhancement or enhancing masses. Note, the area of increased T2
signal in the region of the cisterna magna appears to opacify status post
contrast administration which appears to be more consistent with an
arachnoid cyst.  This area does demonstrate increased signal on the ADC
mapping with decreased signal on diffusion-weighted images again indicative
of an arachnoid cyst.  The sella and parasellar regions and structures
demonstrate no signal or enhancement abnormalities. There does not appear to
be evidence of demyelinating or dysmyelinating disorders.  The
cerebellopontine angle regions and visualized portions of the seventh and
eighth cranial nerves demonstrate no signal or enhancement abnormalities.
The visualized portions of the optic chiasm and optic nerves demonstrate no
signal or enhancement abnormalities.

MRA CIRCLE OF WILLIS:   3D time-of-flight images as well as axial source
images were obtained of the Circle of Willis.

Symmetric flow is demonstrated within the RIGHT and LEFT hemispheres. Flow
is demonstrated within the anterior and posterior circulations.  There does
not appear to be evidence of focal outpouchings, fusiform dilatation,
strictural narrowing or segmental narrowing.  No focal blushes are
identified.
IMPRESSION: 1.     Findings which appear to reflect the sequela of an arachnoid cyst in
the region of the cisterna magna.
2.     No further abnormalities are appreciated within the brain.  Note,
this is an incidental finding.
3.     Unremarkable MRA of the Circle of Willis.

## 2007-09-17 ENCOUNTER — Ambulatory Visit: Payer: Self-pay | Admitting: Internal Medicine

## 2007-10-18 ENCOUNTER — Ambulatory Visit: Payer: Self-pay | Admitting: Internal Medicine

## 2007-11-18 ENCOUNTER — Ambulatory Visit: Payer: Self-pay | Admitting: Internal Medicine

## 2007-12-17 IMAGING — CR DG CHEST 2V
1 series · 2 of 2 positions shown · non-contrast
Comparison: none

REASON FOR EXAM: Fall, pain
COMMENTS:

[Series 1: view not recorded · 0.17mm/px · 2 of 2 slices shown]
[im 1/2]
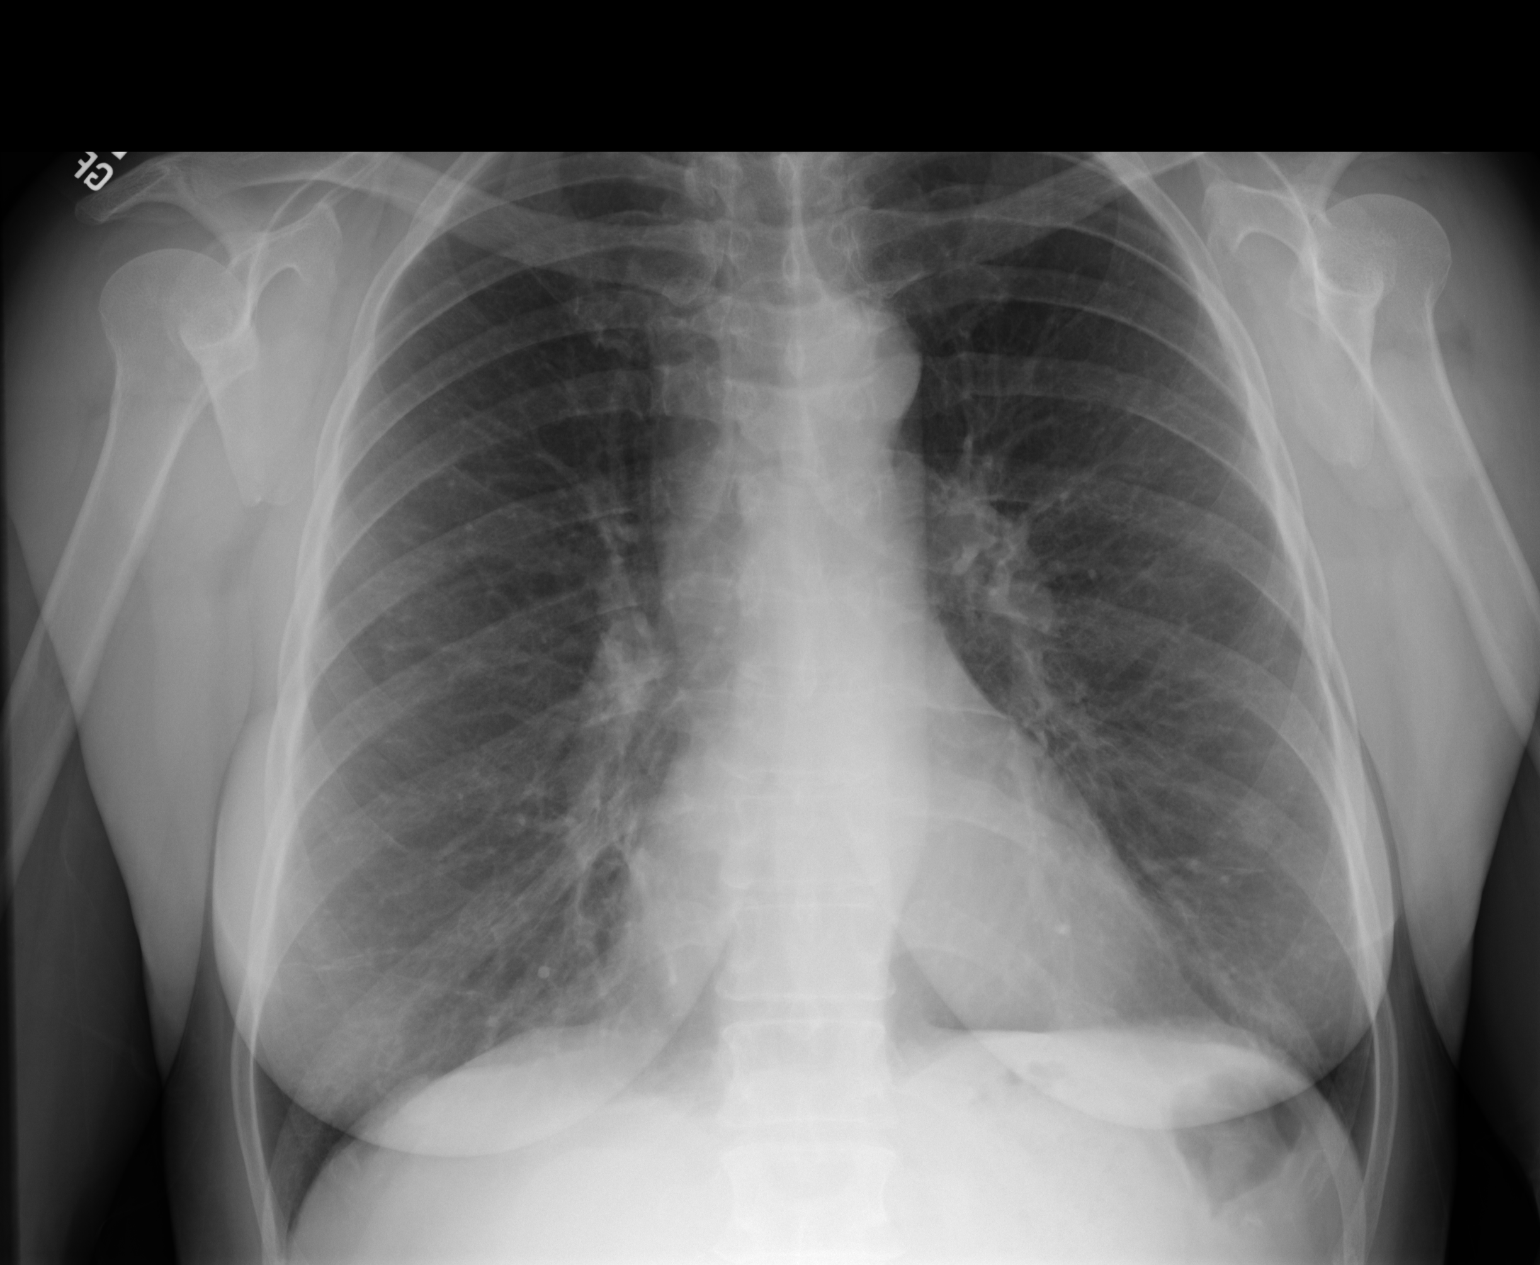
[im 2/2]
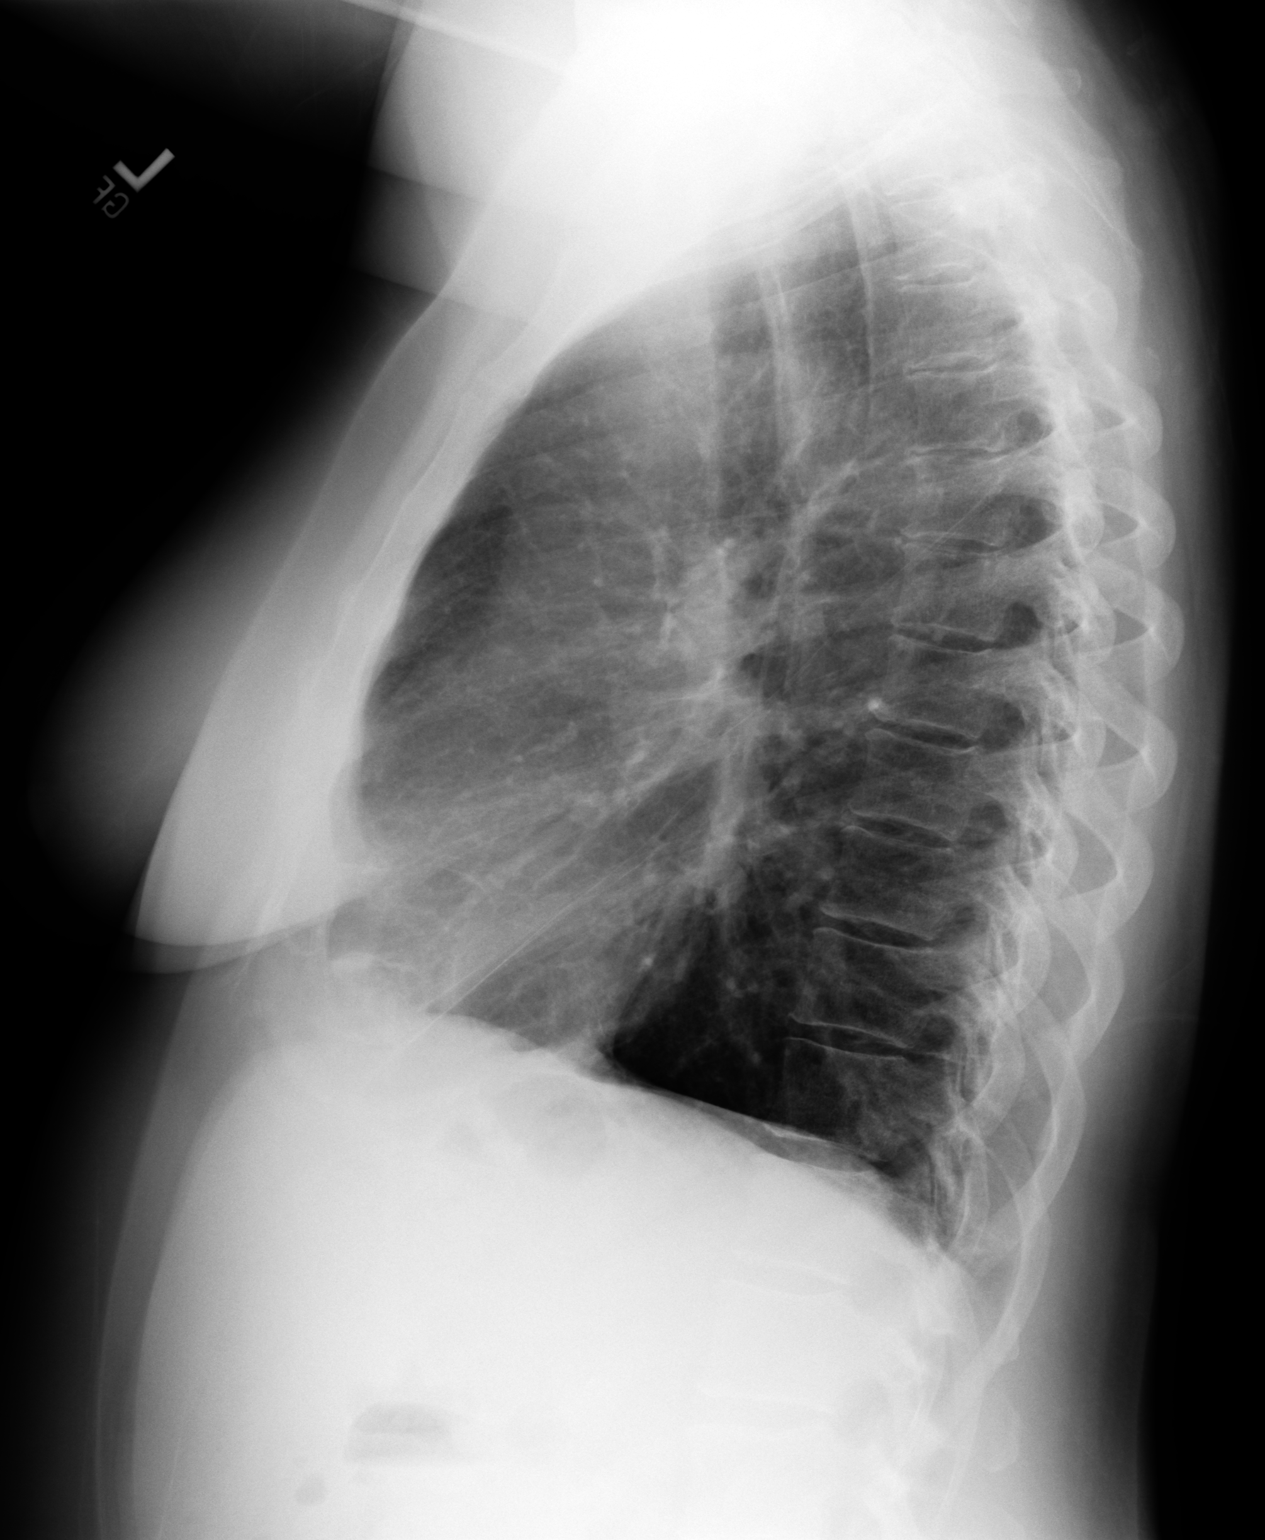

[2 of 2 positions shown; findings below may reference images not displayed]

PROCEDURE:     DXR - DXR CHEST PA (OR AP) AND LATERAL  - June 04, 2007 [DATE]

RESULT:     Comparison is made to images of 01/31/2007.

There is mild hyperinflation. The cardiac silhouette and pulmonary
vasculature are normal. The bony structures and mediastinal structures are
unremarkable. There is no interval change.
IMPRESSION: 1.  Hyperinflation which can be consistent with reactive airway disease or
COPD.
2.  Stable chest. No acute abnormality evident.

## 2007-12-17 IMAGING — CR DG RIBS 2V*R*
1 series · 3 of 3 positions shown · non-contrast
Comparison: none

REASON FOR EXAM: fall,pain
COMMENTS:

PROCEDURE:     DXR - DXR RIBS RIGHT UNILATERAL  - June 04, 2007 [DATE]
RESULT:     Comparison is made to images from a previous chest x-ray dated
06/04/2007. The left ribs appear intact. There is no foreign body. There is
no pneumothorax evident.

[Series 1: view not recorded · 0.17mm/px · 3 of 3 slices shown]
[im 1/3]
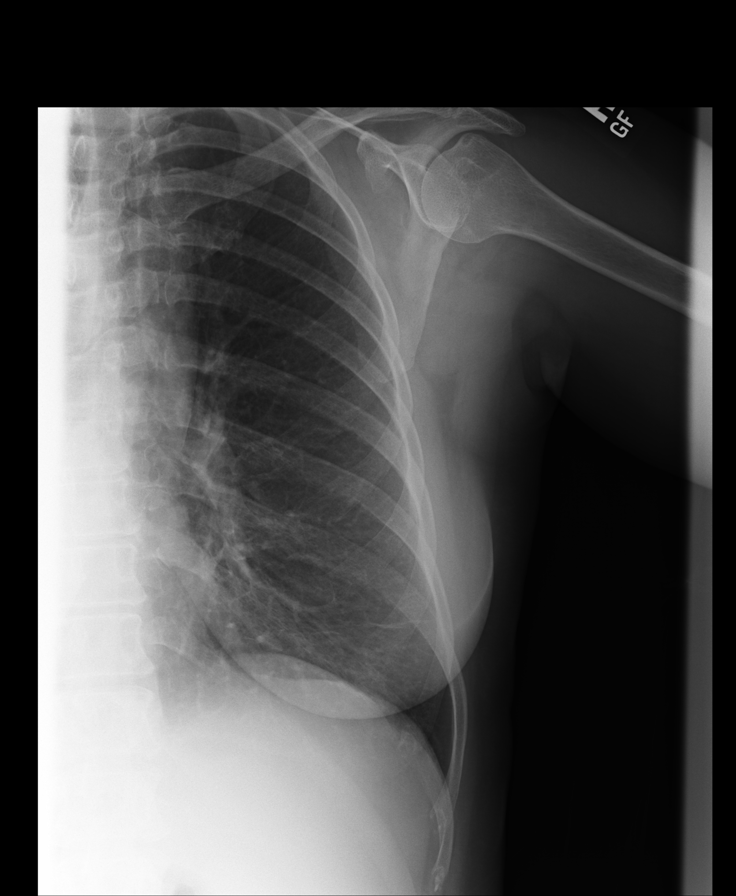
[im 2/3]
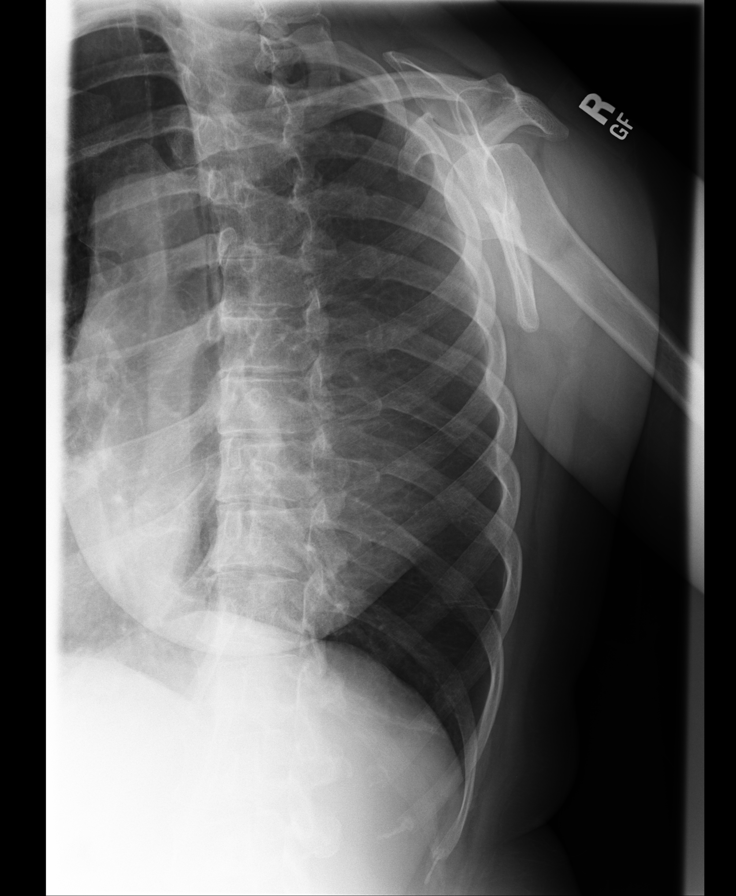
[im 3/3]
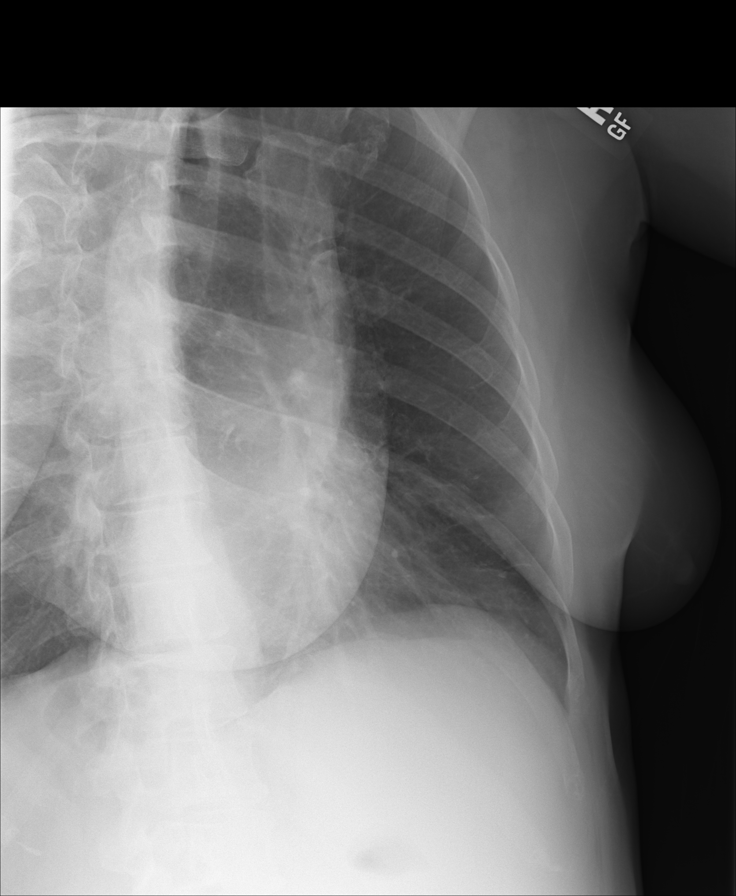

[3 of 3 positions shown; findings below may reference images not displayed]

IMPRESSION: No plain film evidence of left rib fracture.

## 2007-12-19 ENCOUNTER — Ambulatory Visit: Payer: Self-pay | Admitting: Family Medicine

## 2007-12-24 ENCOUNTER — Ambulatory Visit: Payer: Self-pay | Admitting: Internal Medicine

## 2007-12-25 ENCOUNTER — Ambulatory Visit: Payer: Self-pay | Admitting: Internal Medicine

## 2008-01-03 ENCOUNTER — Ambulatory Visit: Payer: Self-pay | Admitting: Internal Medicine

## 2008-01-07 ENCOUNTER — Ambulatory Visit: Payer: Self-pay | Admitting: Internal Medicine

## 2008-01-16 ENCOUNTER — Ambulatory Visit: Payer: Self-pay | Admitting: Internal Medicine

## 2008-01-30 ENCOUNTER — Ambulatory Visit: Payer: Self-pay | Admitting: General Surgery

## 2008-02-15 ENCOUNTER — Ambulatory Visit: Payer: Self-pay | Admitting: Internal Medicine

## 2008-02-29 ENCOUNTER — Ambulatory Visit: Payer: Self-pay | Admitting: Internal Medicine

## 2008-03-17 ENCOUNTER — Ambulatory Visit: Payer: Self-pay | Admitting: Internal Medicine

## 2008-03-19 IMAGING — US ABDOMEN ULTRASOUND
1 series · 14 of 25 positions shown · non-contrast
Comparison: none

REASON FOR EXAM: Polycythemia splenomegaly
COMMENTS:

[Series 1: abdomen ultrasound · 0.38mm/px · 14 of 71 slices shown]
[im 1/71]
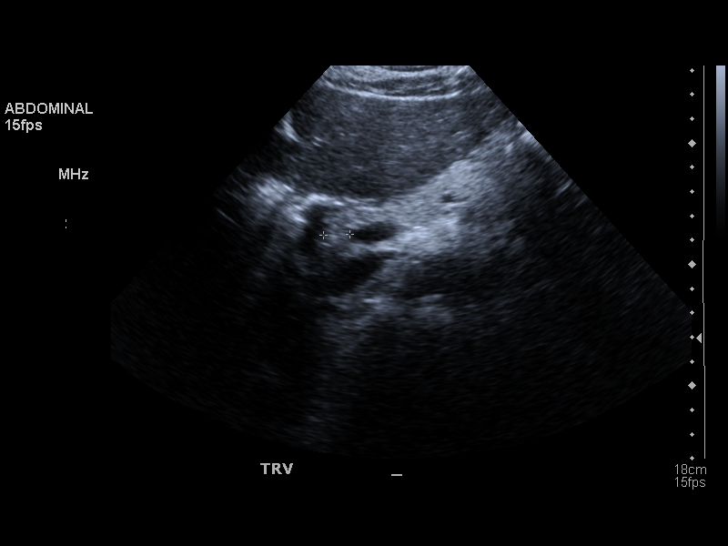
[im 6/71]
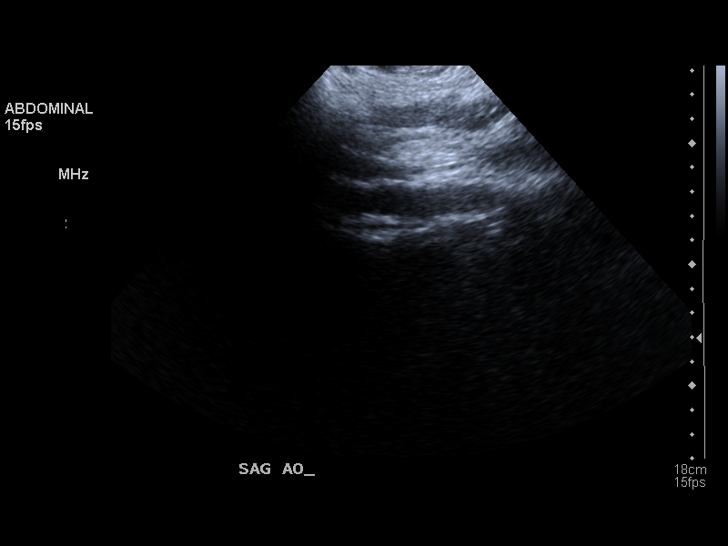
[im 12/71]
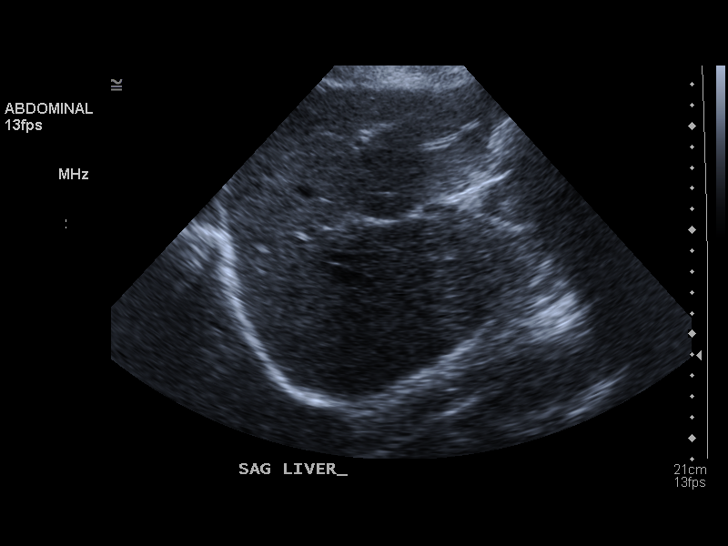
[im 18/71]
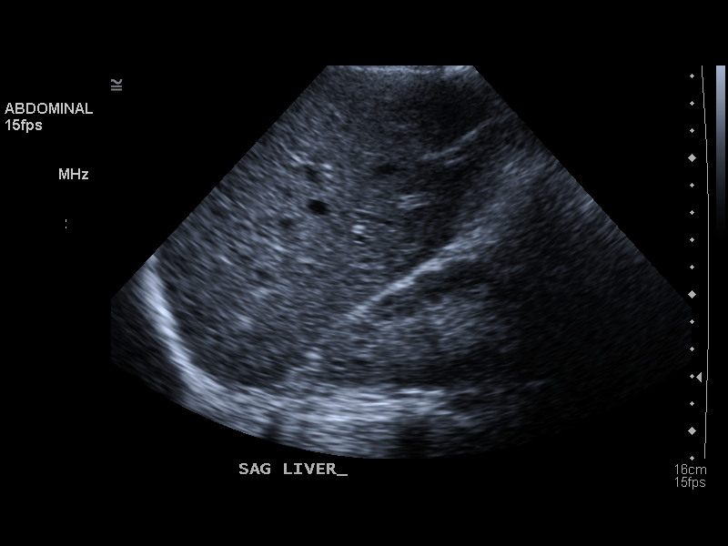
[im 24/71]
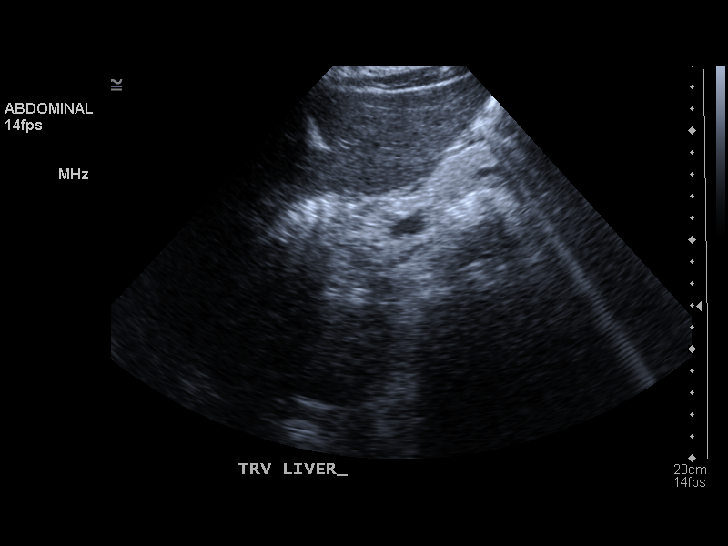
[im 27/71]
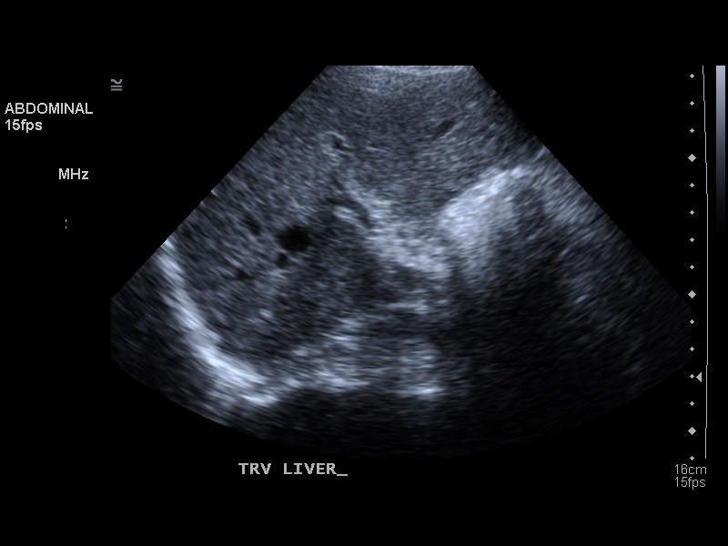
[im 33/71]
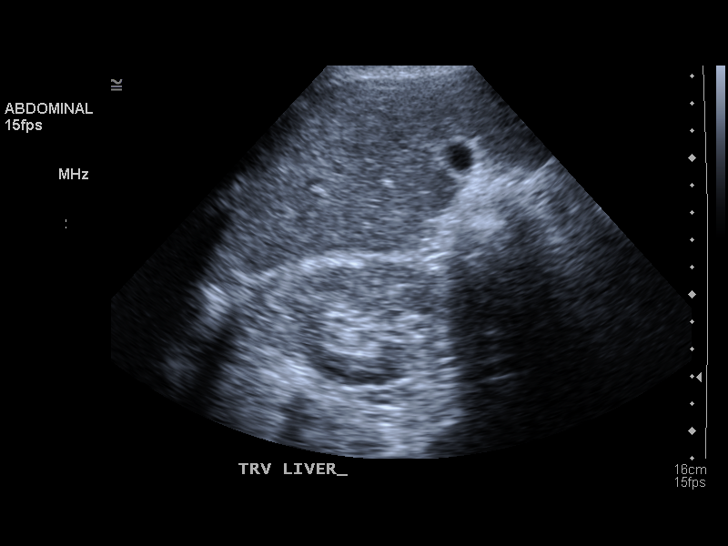
[im 38/71]
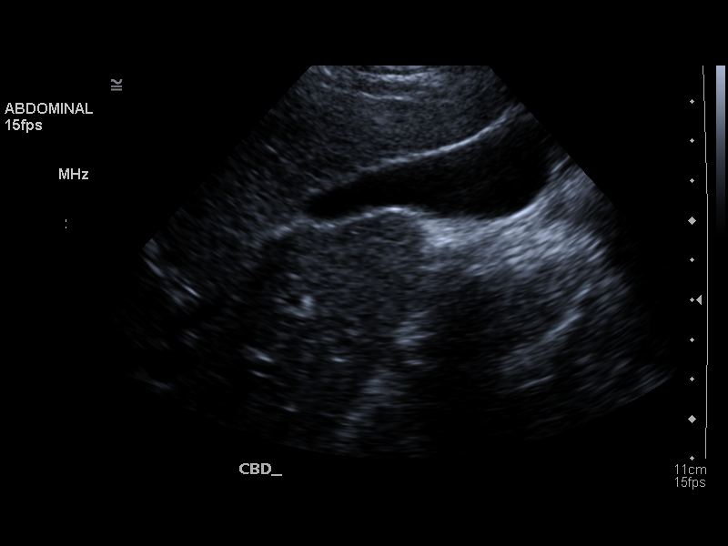
[im 44/71]
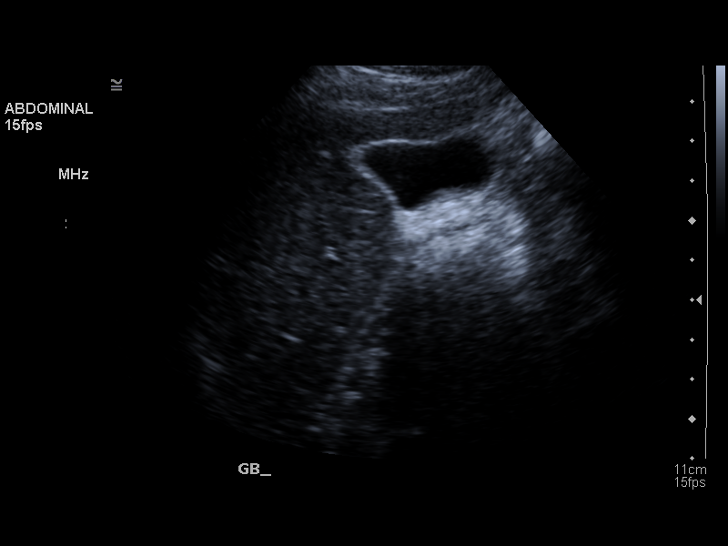
[im 47/71]
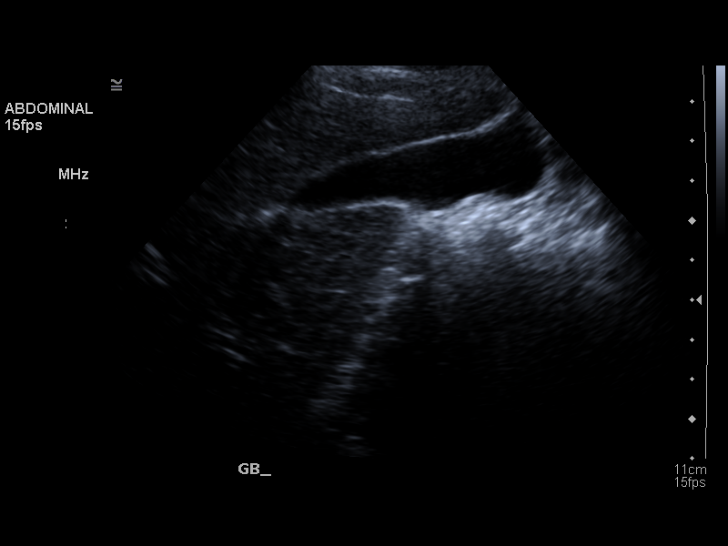
[im 53/71]
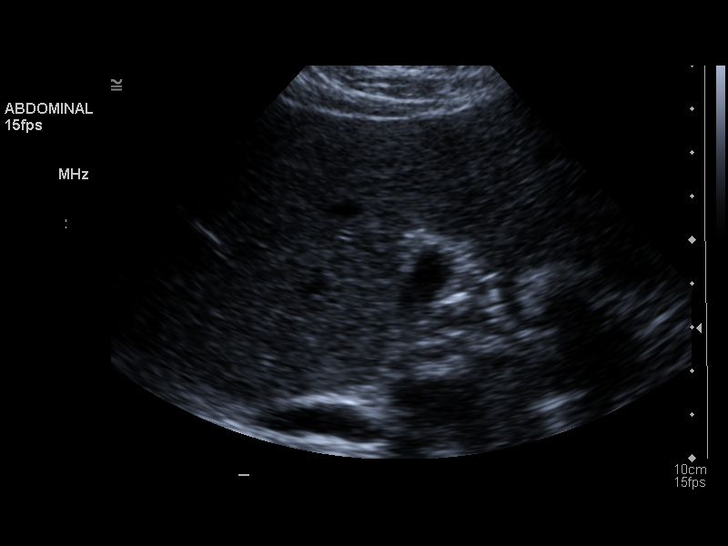
[im 59/71]
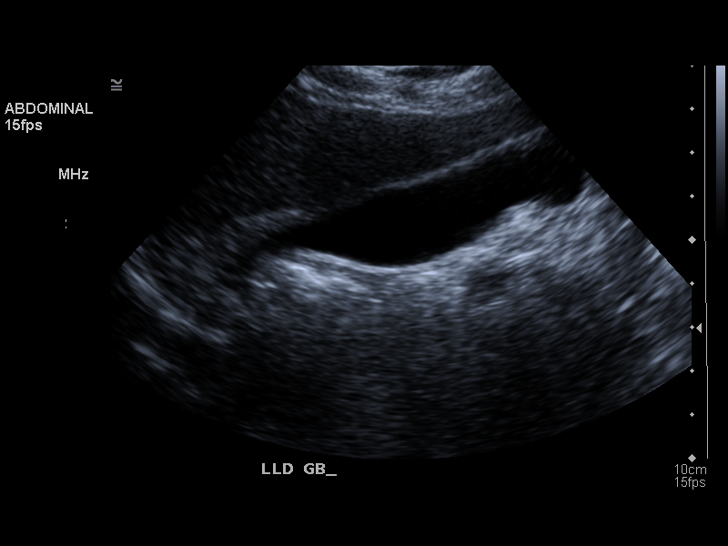
[im 65/71]
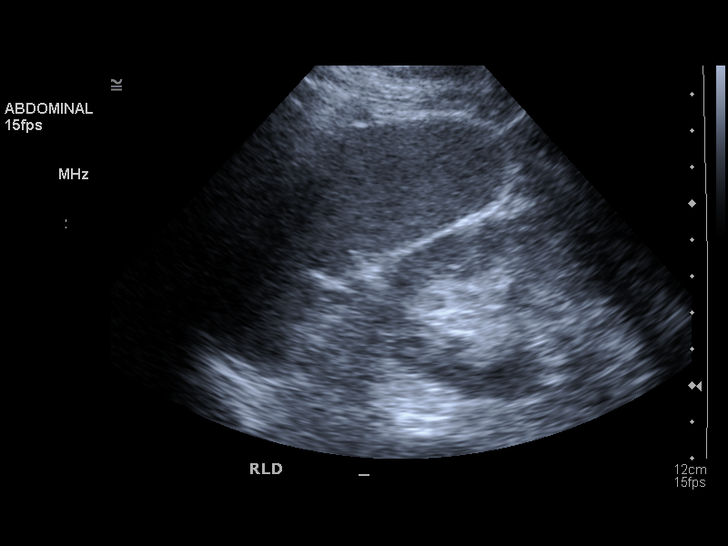
[im 71/71]
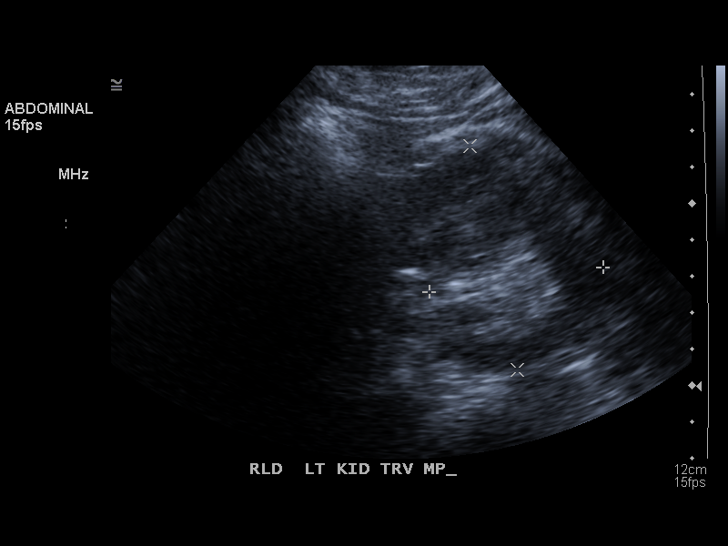

[14 of 25 positions shown; findings below may reference images not displayed]

PROCEDURE:     US  - US ABDOMEN GENERAL SURVEY  - September 05, 2007  [DATE]

RESULT:     No significant abnormalities of the liver, inferior vena cava,
pancreas or spleen are seen.  The spleen measures 9.13 cm in AP diameter
which is within normal limits.  No gallstones are seen. There is no
thickening of the gallbladder wall.  The common bile duct measures 4.0 mm in
diameter which is within normal limits.  The kidneys show no hydronephrosis.
There is no ascites.
IMPRESSION: 1.     No significant abnormalities are noted.
2.     The spleen size is normal.

## 2008-03-27 DIAGNOSIS — I10 Essential (primary) hypertension: Secondary | ICD-10-CM | POA: Insufficient documentation

## 2008-05-04 IMAGING — NM NUCLEAR MEDICINE HEPATOHBILIARY INCLUDE GB
2 series · 12 of 12 positions shown · non-contrast
Comparison: none

REASON FOR EXAM: abdominal pain US showed polyps
COMMENTS:

[Series 1000: gallbladder dynamic · 4.80mm/px · 6 of 60 frames shown]
[frame 6/60]
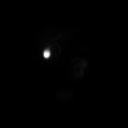
[frame 16/60]
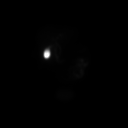
[frame 26/60]
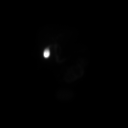
[frame 36/60]
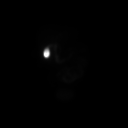
[frame 46/60]
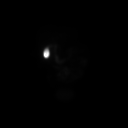
[frame 56/60]
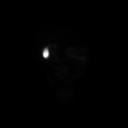

[Series 1000: gallbladder dynamic (results) · 4.80mm/px · 6 of 60 frames shown]
[frame 6/60]
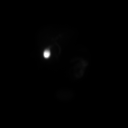
[frame 16/60]
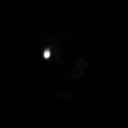
[frame 26/60]
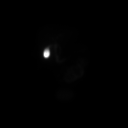
[frame 36/60]
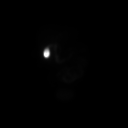
[frame 46/60]
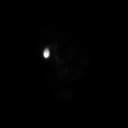
[frame 56/60]
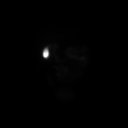

[12 of 12 positions shown; findings below may reference images not displayed]

PROCEDURE:     NM  - NM HEPATO WITH GB EJECT FRACTION  - January 07, 2008 [DATE]

RESULT:     Following intravenous administration of 8.18 mCi Tc 99m
Choletec, there is noted prompt visualization of tracer activity in the
liver at 3 minutes. At 30 minutes tracer activity is visualized in the
gallbladder, common duct and proximal small bowel.

The gallbladder ejection fraction at 30 minutes measured 11%. This value is
below the normal range.
IMPRESSION: 1. Normal hepatobiliary scan.
2. The gallbladder ejection fraction measures 11%, which is below the normal
range.

## 2008-07-02 IMAGING — CR CERVICAL SPINE - COMPLETE 4+ VIEW
1 series · 6 of 6 positions shown · non-contrast
Comparison: none

REASON FOR EXAM: lt arm pain
COMMENTS:

[Series 1: view not recorded · 0.17mm/px · 6 of 6 slices shown]
[im 1/6]
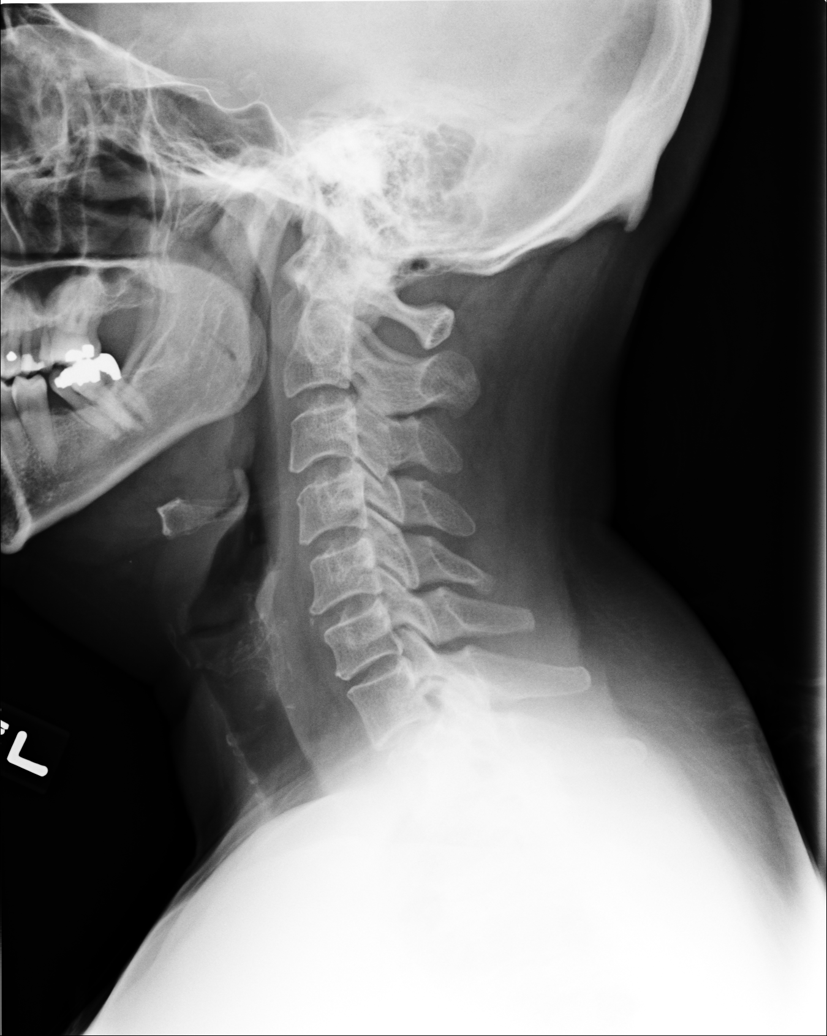
[im 2/6]
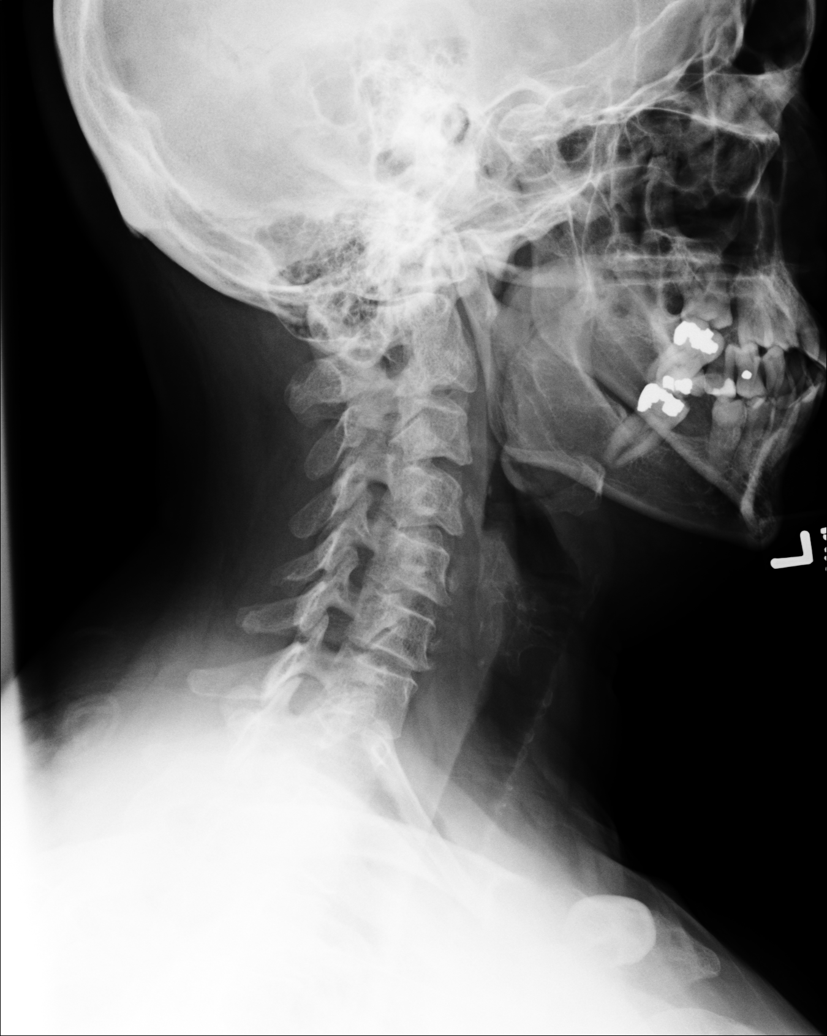
[im 3/6]
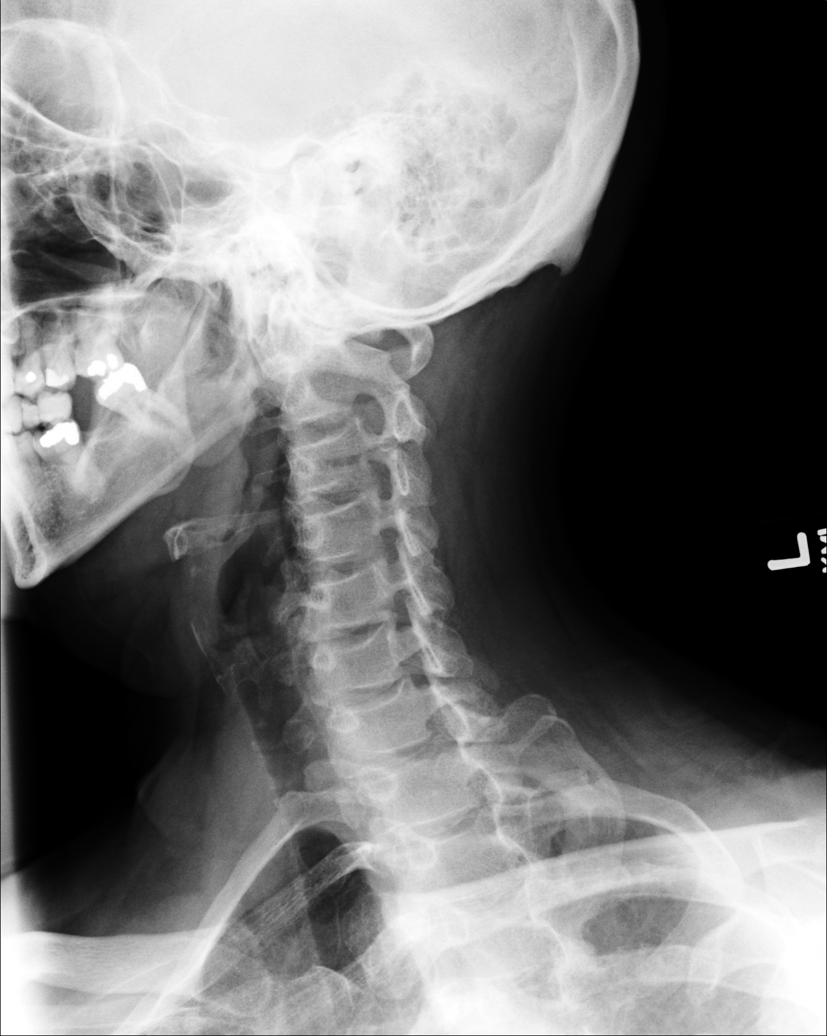
[im 4/6]
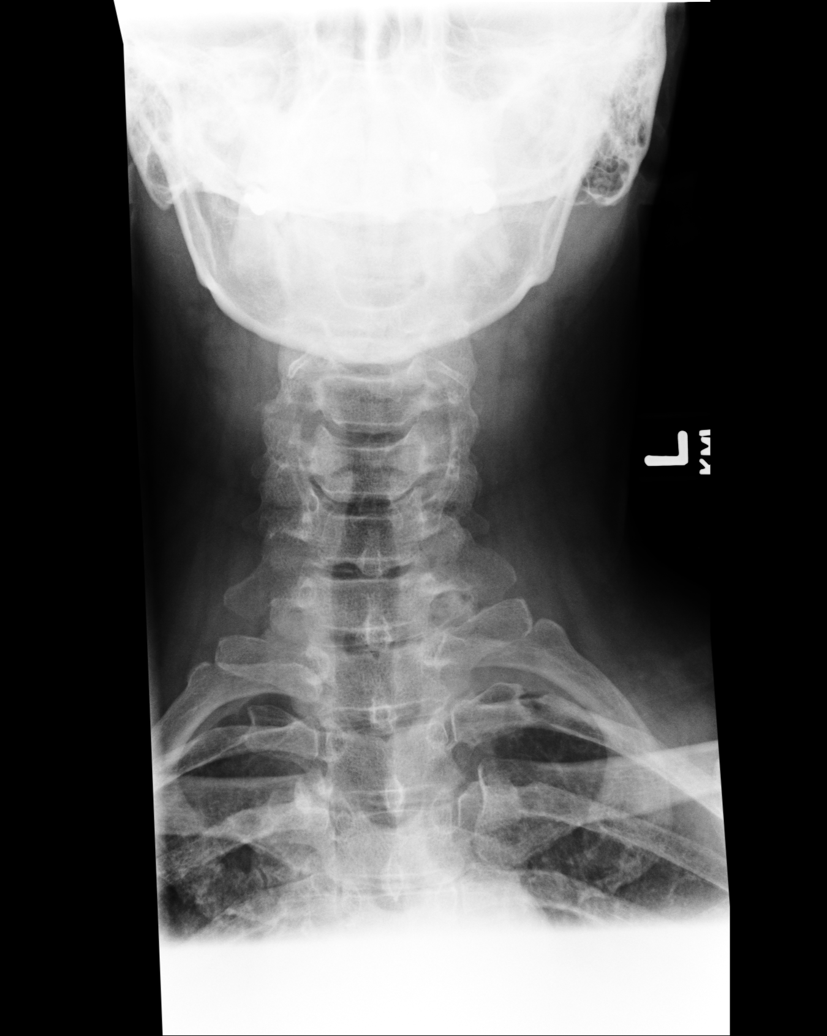
[im 5/6]
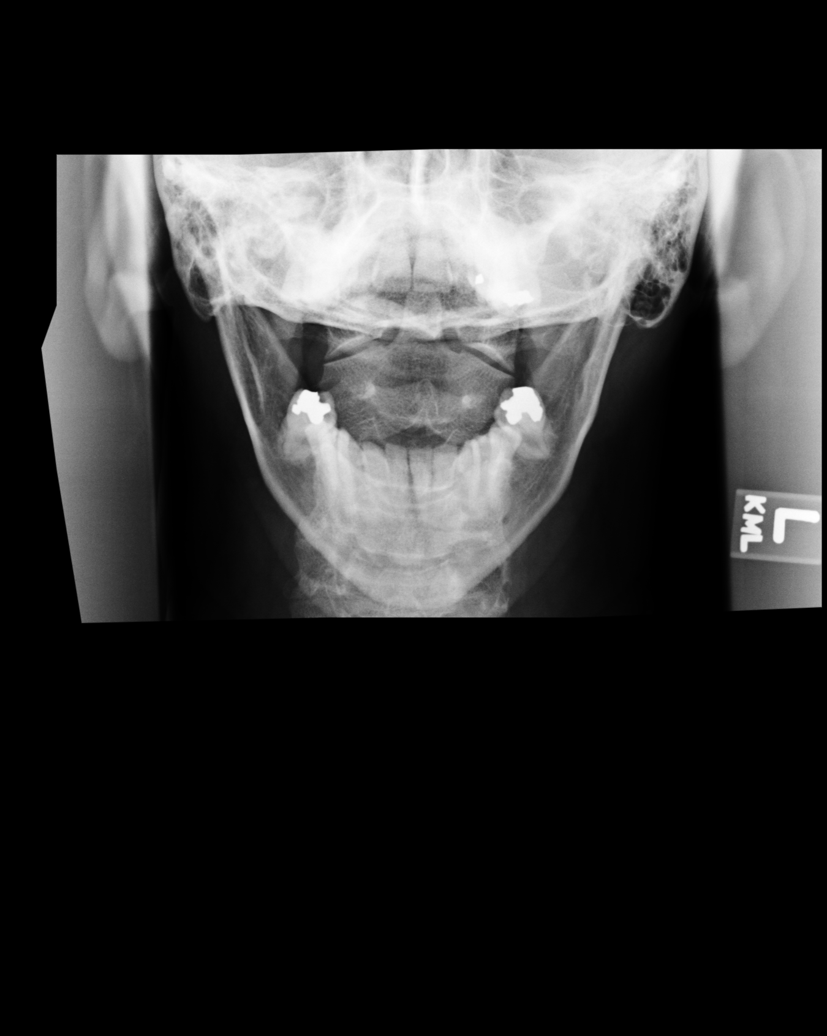
[im 6/6]
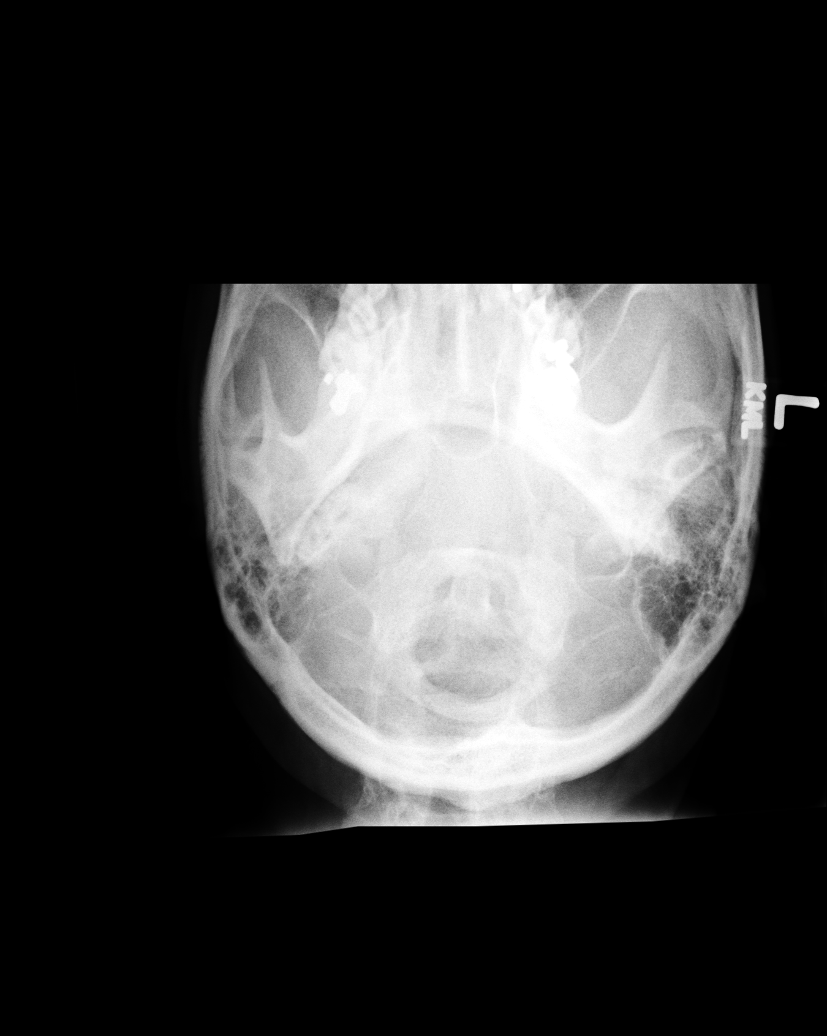

[6 of 6 positions shown; findings below may reference images not displayed]

PROCEDURE:     DXR - DXR CERVICAL SPINE COMPLETE  - December 19, 2007  [DATE]

RESULT:     The vertebral body heights and the intervertebral disc spaces
are well maintained. Oblique view shows slight spur impingement on the
neural foramina at C5-C6 on the LEFT. The neural foramina otherwise are
widely patent bilaterally. The odontoid process is intact. In the lateral
view there is noted slight anterior spur formation at C5.
IMPRESSION: 1. No fracture is seen.
2. There is slight spur impingement on the neural foramina at C5-C6 on the
LEFT.

## 2008-07-08 IMAGING — MR MRI CERVICAL SPINE WITHOUT AND WITH CONTRAST
6 of 7 series · 32 of 48 positions shown · non-contrast
Comparison: none

REASON FOR EXAM: Abnormal x-ray showing spur impingement on C5-6 on the
left
COMMENTS:

PROCEDURE:     MR  - MR CERVICAL SPINE WO/W  - December 25, 2007  [DATE]
RESULT:     There is a history of LEFT arm numbness and tingling and burning
in both arms and hands - worse for the last two weeks.
TECHNIQUE: Multiplanar/multisequence imaging of the cervical spine is
obtained.
Comparison is made to prior plain films of 12/19/2007.

[Series 3: T1 · sagittal · 3.0mm · 0.51mm/px · 4 of 14 slices shown (1 of 2)]
[im 1/14]
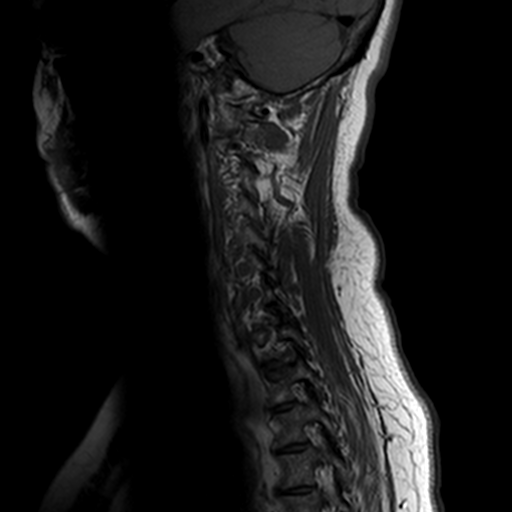
[im 5/14]
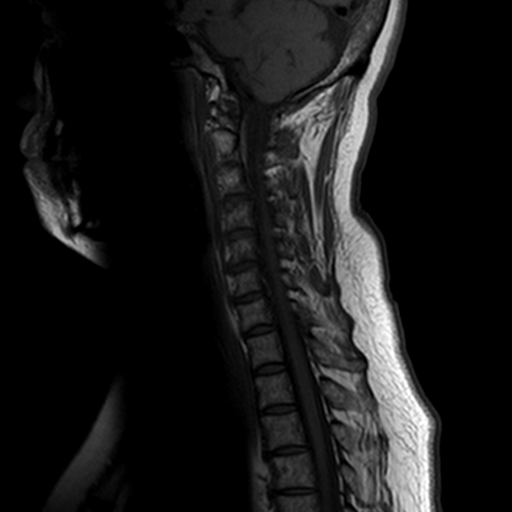
[im 9/14]
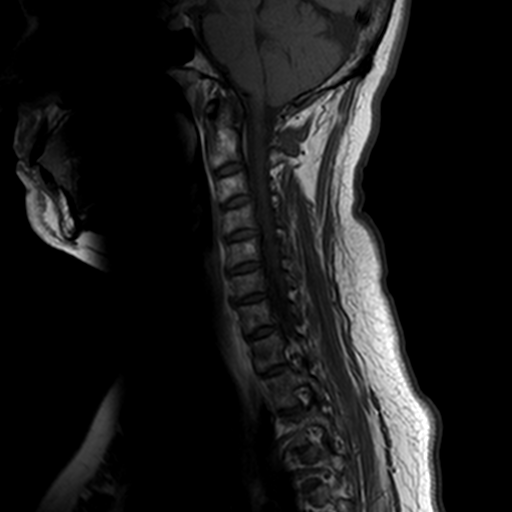
[im 14/14]
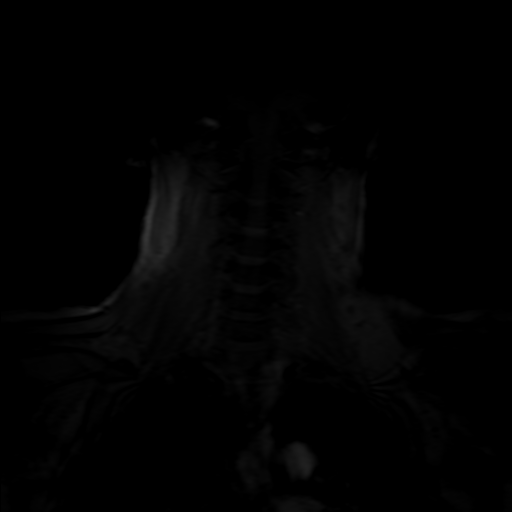

[Series 4: T2 fat-sat · sagittal · 4.0mm · 0.68mm/px · 4 of 14 slices shown]
[im 1/14]
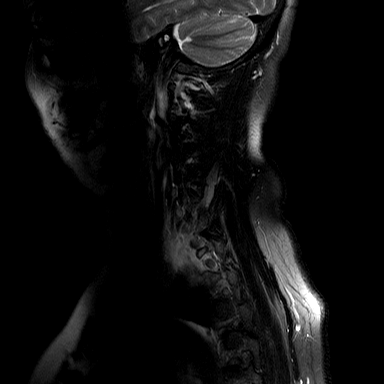
[im 5/14]
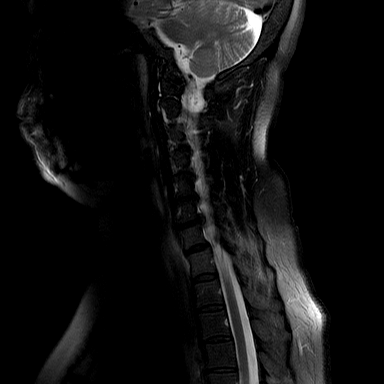
[im 9/14]
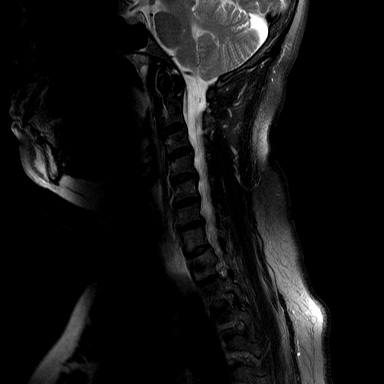
[im 14/14]
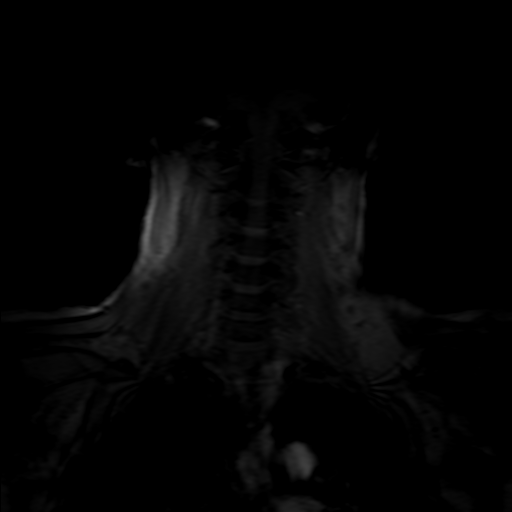

[Series 7: T2 · sagittal · 3.0mm · 0.84mm/px · 5 of 14 slices shown]
[im 1/14]
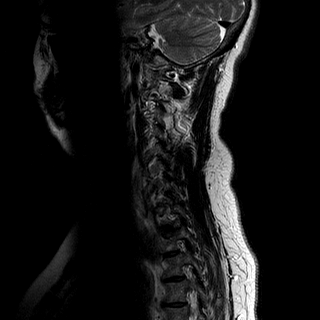
[im 4/14]
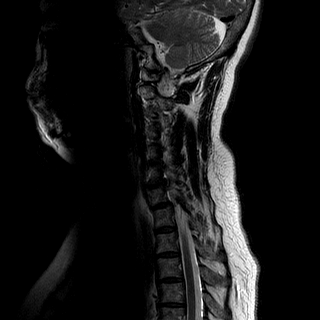
[im 7/14]
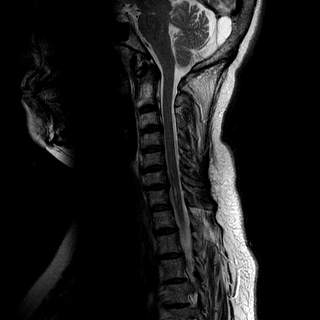
[im 10/14]
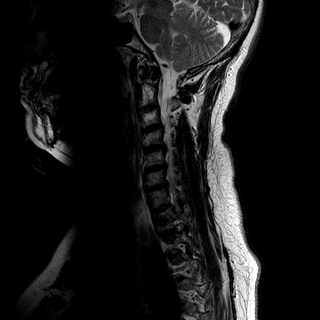
[im 14/14]
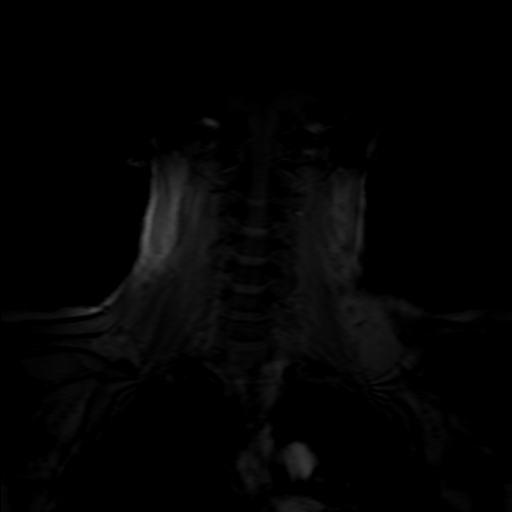

[Series 9: t1_se_ax ((person_name)) · axial · 3.0mm · 0.78mm/px · z∈[-76,+115]mm · 8 of 28 slices shown]
[im 1/28]
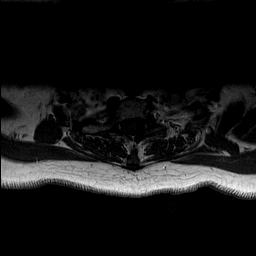
[im 4/28]
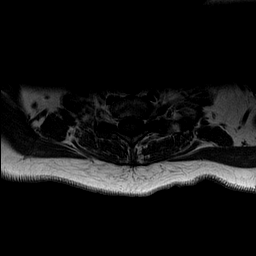
[im 10/28]
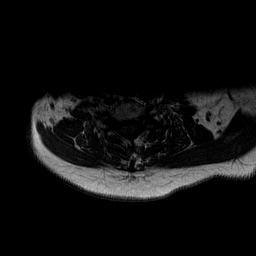
[im 13/28]
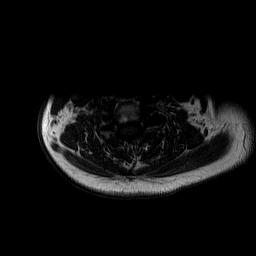
[im 16/28]
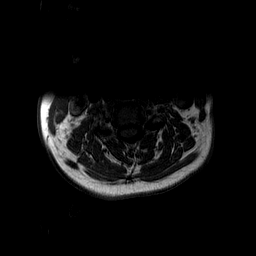
[im 19/28]
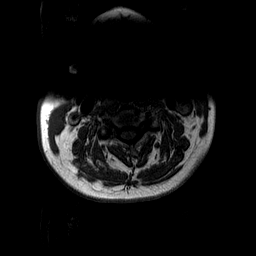
[im 25/28]
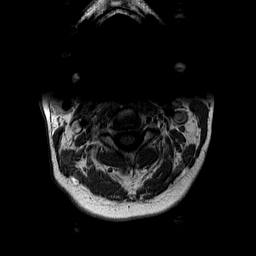
[im 28/28]
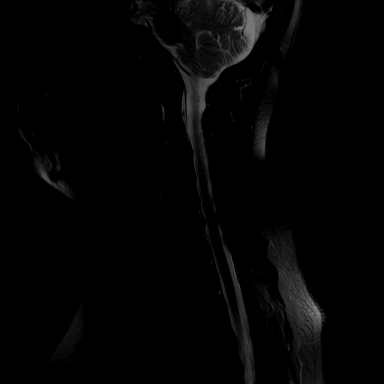

[Series 11: t2_fl2d_tra (try 11-5) · axial · 3.0mm · 0.78mm/px · z∈[-76,-9]mm · 6 of 28 slices shown]
[im 1/28]
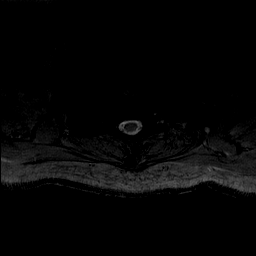
[im 4/28]
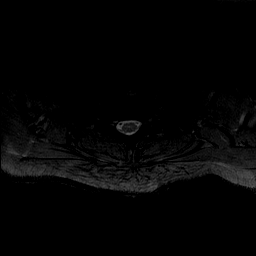
[im 10/28]
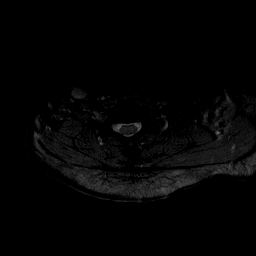
[im 13/28]
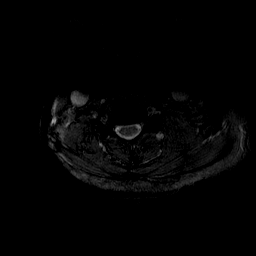
[im 16/28]
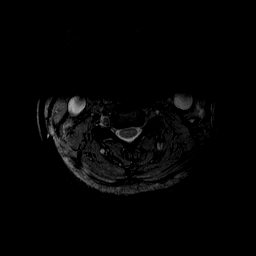
[im 19/28]
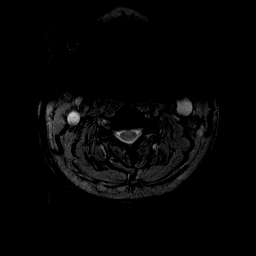

[Series 13: T1 · axial · 10.0mm · 0.59mm/px · z∈[+0,+117]mm · 5 of 14 slices shown (2 of 2)]
[im 1/14]
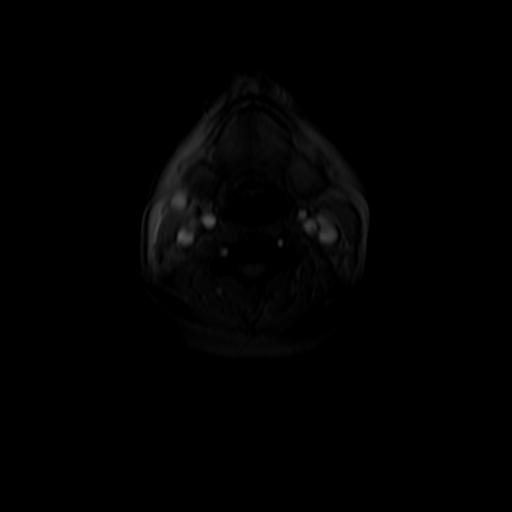
[im 4/14]
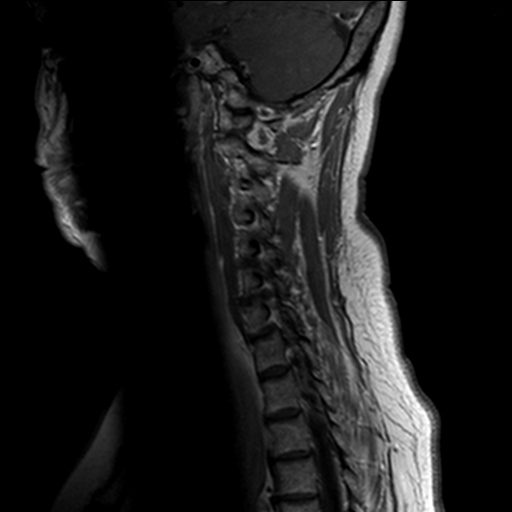
[im 7/14]
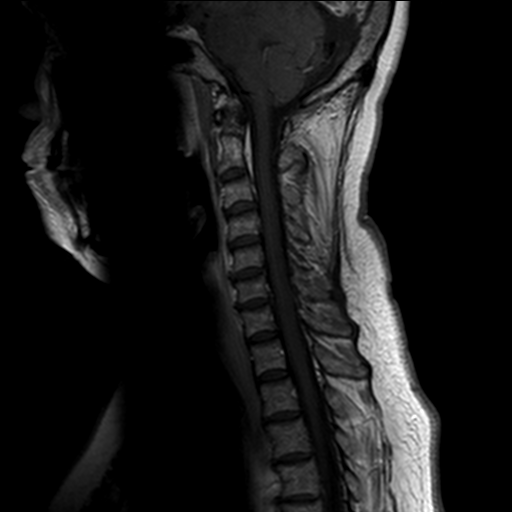
[im 10/14]
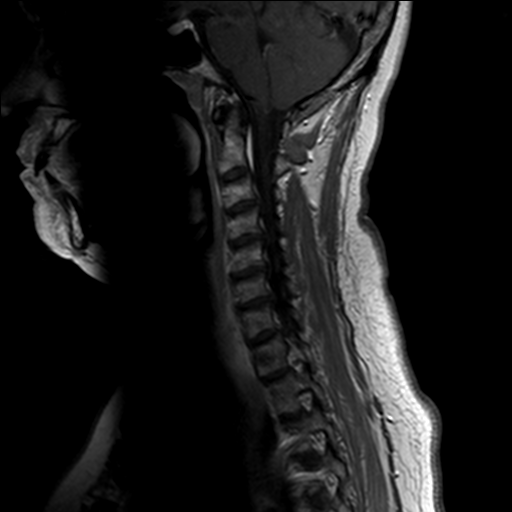
[im 14/14]
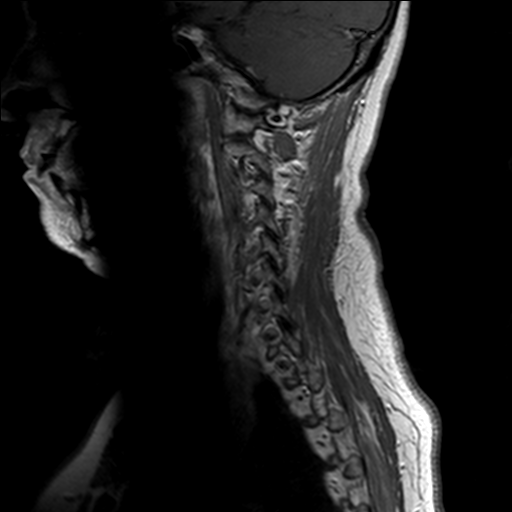

[32 of 48 positions shown; findings below may reference images not displayed]

FINDINGS: Multilevel disc degeneration is present. There is a tiny C6-7 disc
protrusion. This is associated with end-plate osteophyte formation. Minimal
narrowing of the thecal sac is present. The thecal sac measures
approximately 9.0 mm. Similar findings to a lesser degree are present at
C5-6. Bilateral mild neural foraminal narrowing is present at C3-4 secondary
to end-plate osteophyte formation and mild facetal hypertrophy. Bilateral
C5-6 mild to moderate neural foraminal narrowing is present secondary to
degenerative end-plate osteophyte formation. Gadolinium enhanced images were
obtained and reveal no focal abnormality. Slight prominence of the central
canal is noted within the cord. No definite syrinx or enhancing cord
abnormality is noted. No focal bony abnormality is noted. The
craniovertebral junction is normal.
IMPRESSION: 1.     Mild diffuse disc degeneration with a small central disc protrusion
at C6-7 with resultant very mild narrowing of the thecal sac at this level.
The thecal sac is narrowed to approximately 9.0 mm. Similar findings are
noted to a lesser degree at C5-6.
2.     Multifocal mild bilateral neural foraminal narrowing is noted at the
neural foramen levels listed above.

## 2008-07-17 IMAGING — US ABDOMEN ULTRASOUND
1 series · 17 of 25 positions shown · non-contrast
Comparison: none

REASON FOR EXAM: abd pain 3 days
COMMENTS:

PROCEDURE:     US  - US ABDOMEN GENERAL SURVEY  - January 03, 2008  [DATE]
RESULT:     Comparison: Abdominal sonogram on 09/05/2007.

[Series 1: abdomen ultrasound · 17 of 79 slices shown]
[im 1/79]
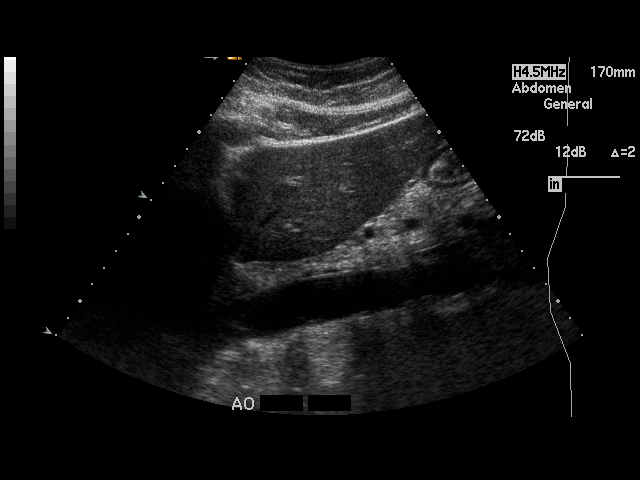
[im 7/79]
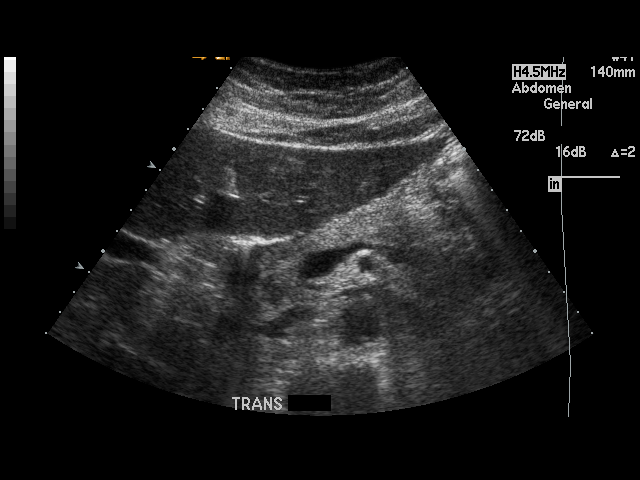
[im 10/79]
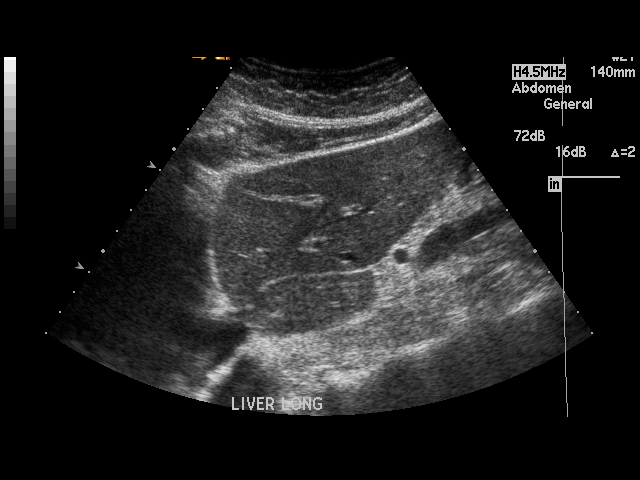
[im 17/79]
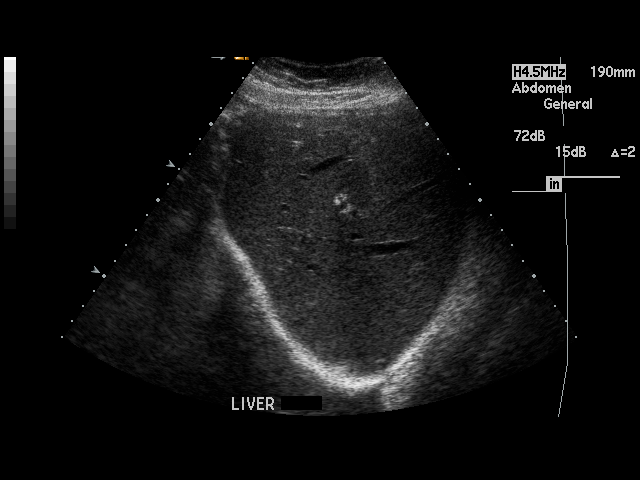
[im 20/79]
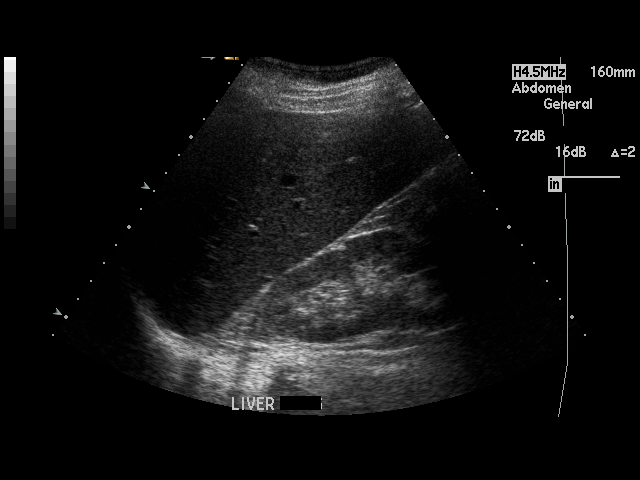
[im 27/79]
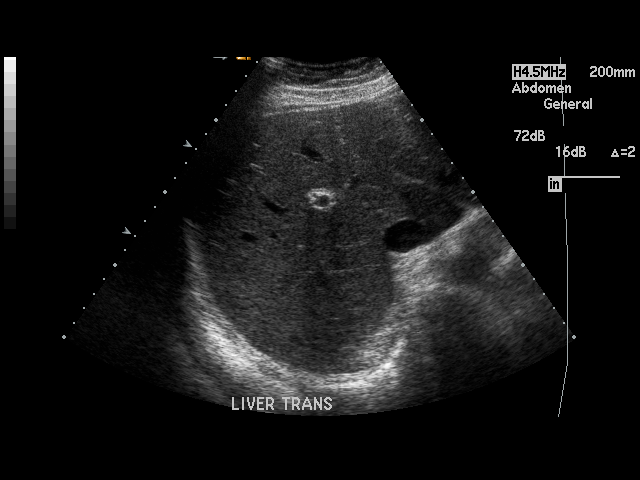
[im 30/79]
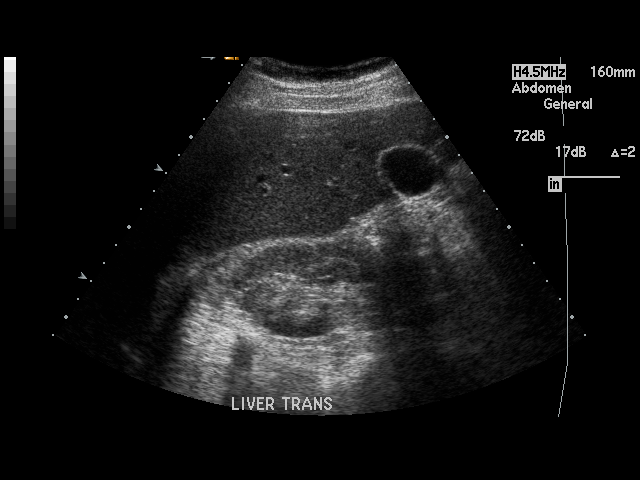
[im 36/79]
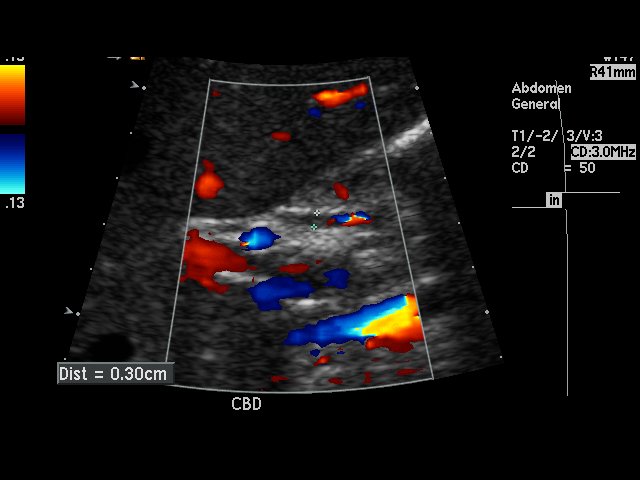
[im 40/79]
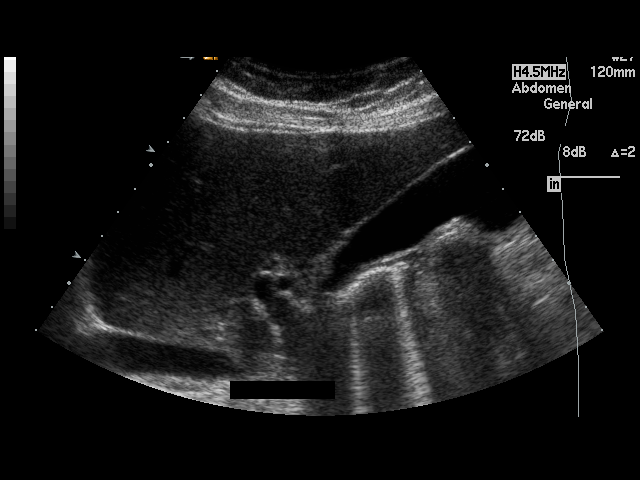
[im 43/79]
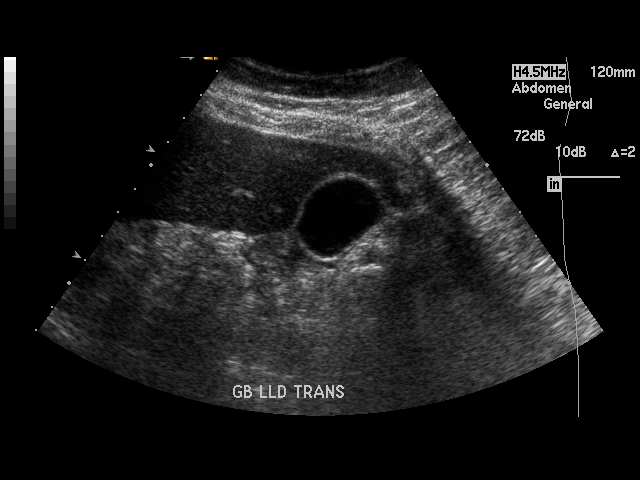
[im 49/79]
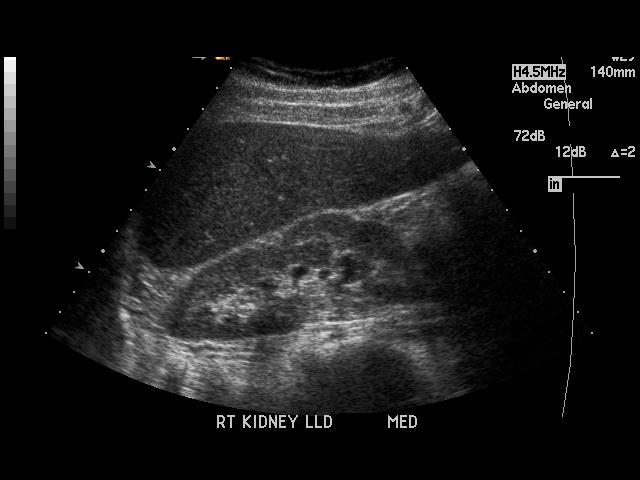
[im 53/79]
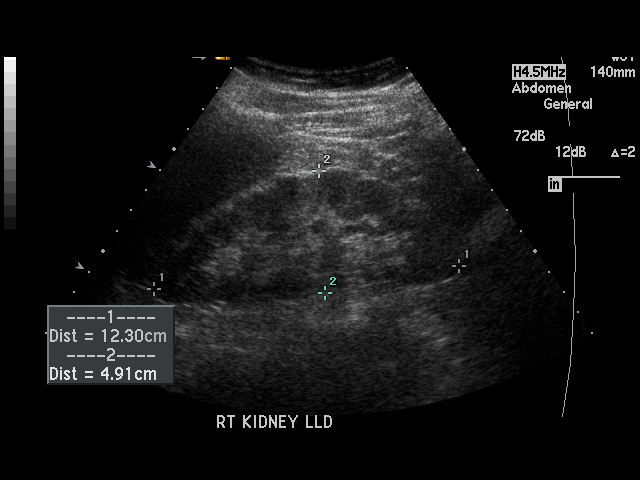
[im 59/79]
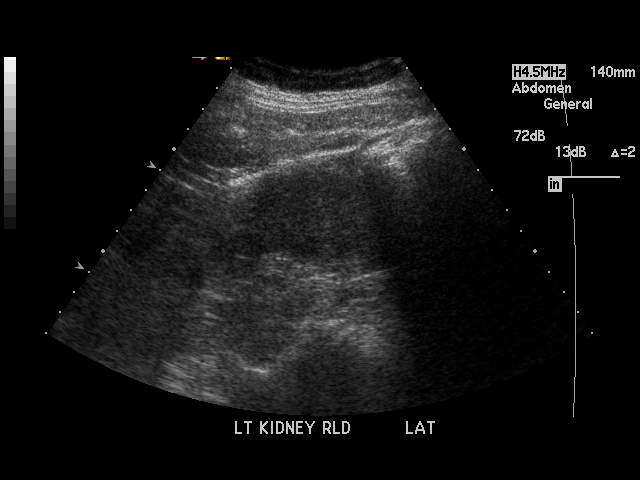
[im 62/79]
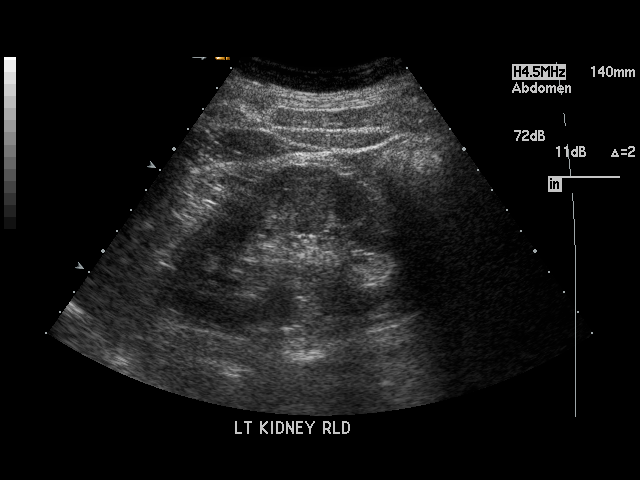
[im 69/79]
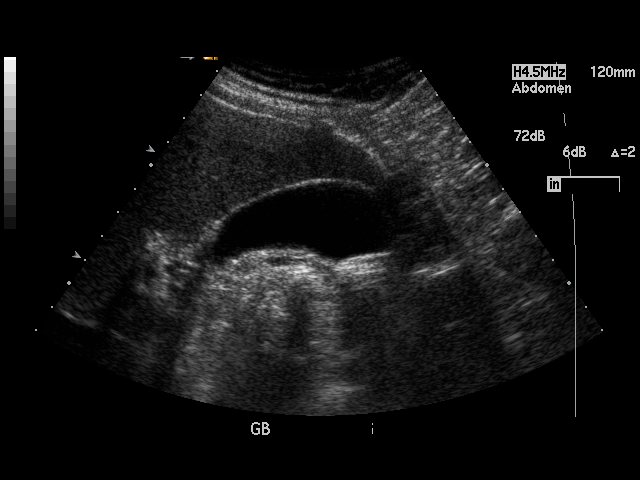
[im 72/79]
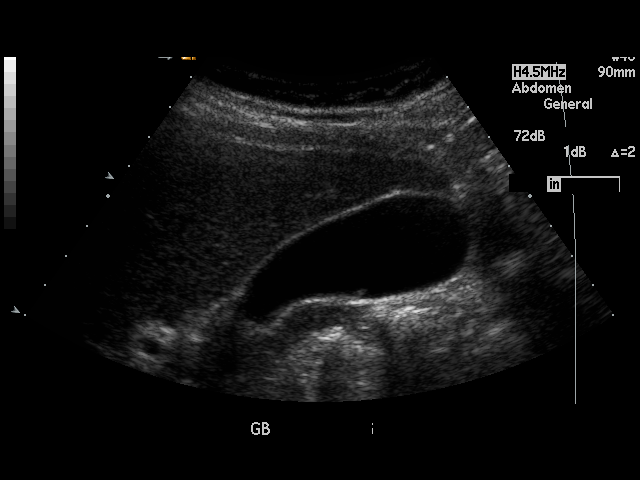
[im 79/79]
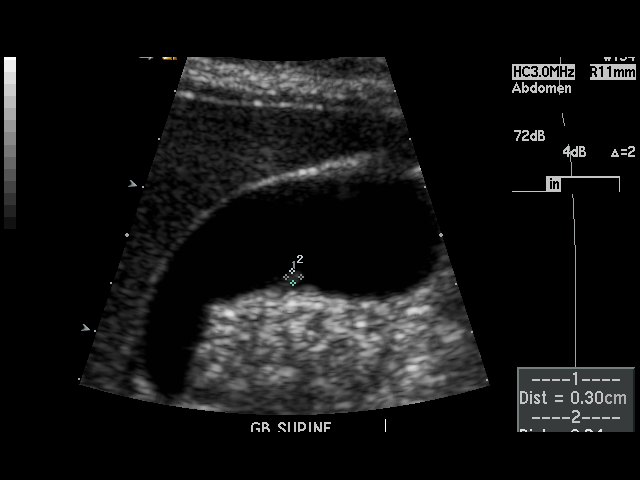

[17 of 25 positions shown; findings below may reference images not displayed]

FINDINGS: The liver is unremarkable without evidence of a focal mass. There is no
intra or extrahepatic bile duct dilatation. The common duct measures 4 mm in
diameter. No gallstone is noted. Persistent 3 mm soft tissue echogenicity
structure along the posterior gallbladder wall may represent a small polyp.
No gallbladder wall thickening or pericholecystic fluid. There is no
sonographic Murphy's sign. The main portal vein is patent with hepatopedal
flow.

The pancreas is unremarkable. The right kidney measures 12.3 cm and the left
measures 11.7 cm in length. There is normal corticomedullary
differentiation. No definite renal stone or mass is noted. There is a 6 mm
cyst involving right upper pole kidney. There is no hydronephrosis. The
spleen measures 10.7 cm in length. No evidence of abdominal aortic aneurysm.
IMPRESSION: 1. Persistent 3 mm soft tissue echogenicity structure along the posterior
gallbladder wall may represent a small polyp. There is no evidence of
cholelithiasis or acute cholecystitis.

## 2008-08-13 IMAGING — CT CT ABD-PELV W/ CM
1 of 2 series · 15 of 32 positions shown, 19 images · non-contrast
Comparison: none

REASON FOR EXAM: atypical abdominal pain
COMMENTS:

[Series 2: abdomen · axial · 0.68mm/px · z∈[-608,-208]mm · 15 of 56 slices shown, 19 images]
[im 3/56  soft-tissue]
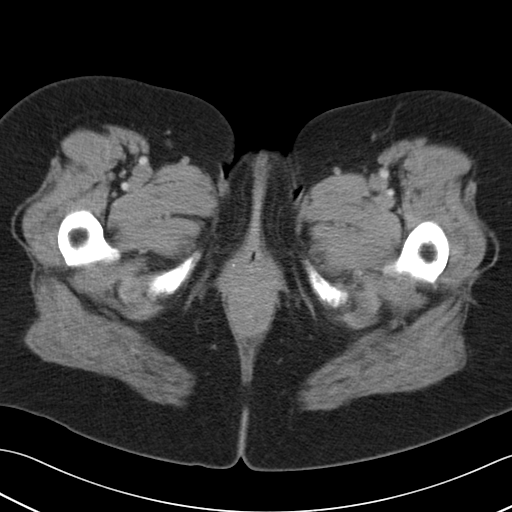
[im 3/56  bone]
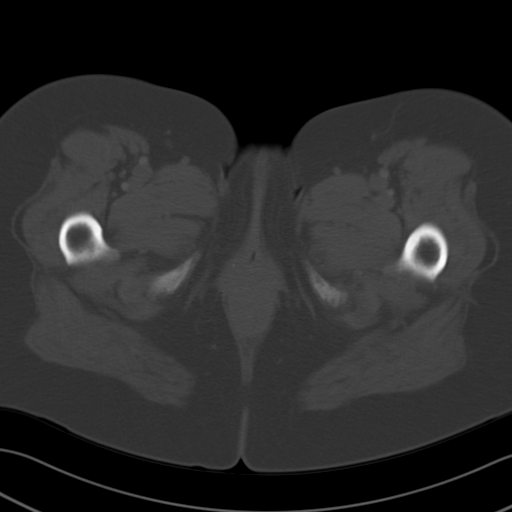
[im 7/56  soft-tissue]
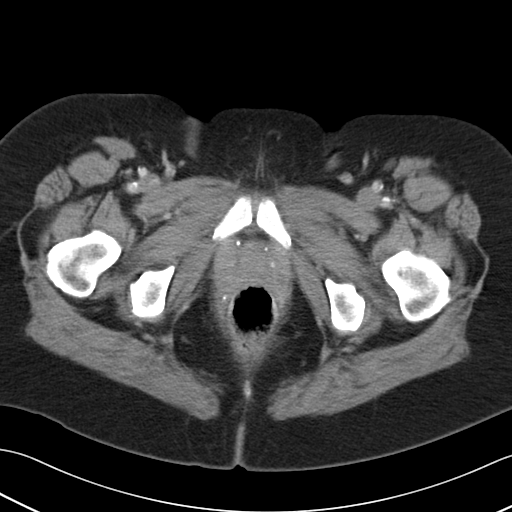
[im 12/56  soft-tissue]
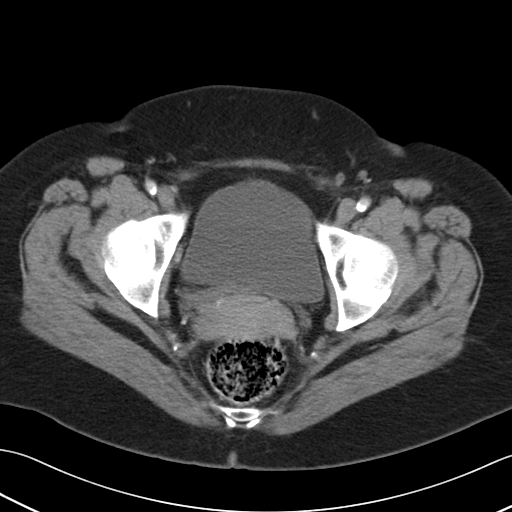
[im 16/56  soft-tissue]
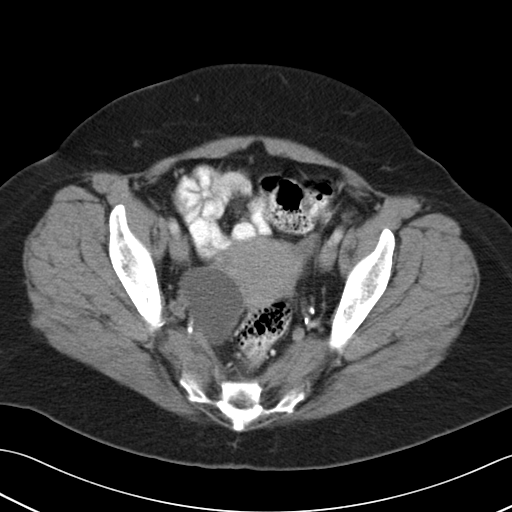
[im 20/56  soft-tissue]
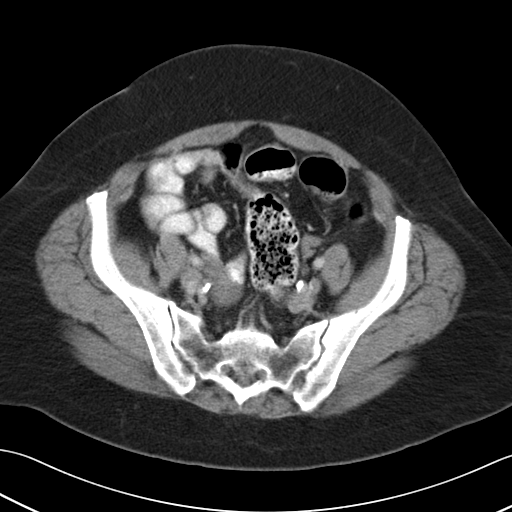
[im 25/56  soft-tissue]
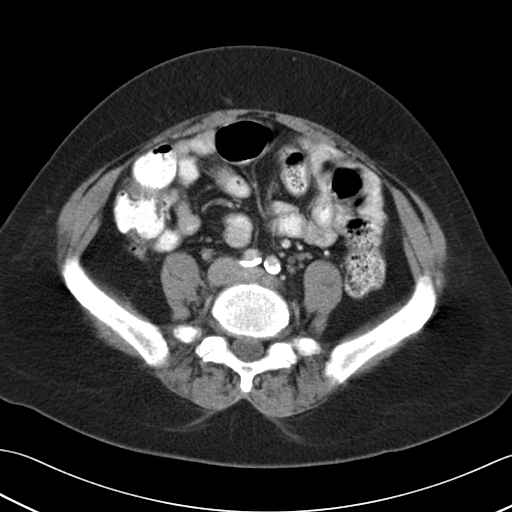
[im 29/56  soft-tissue]
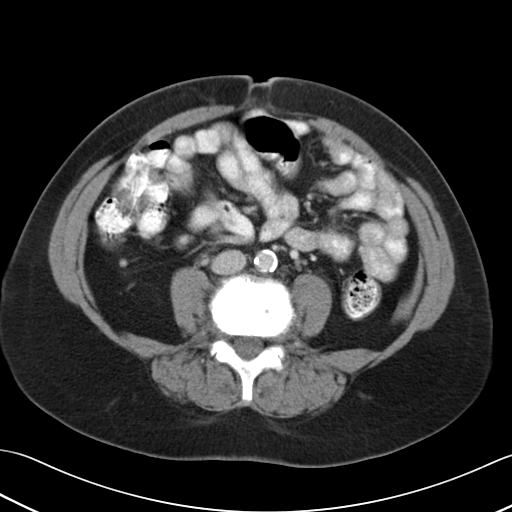
[im 31/56  soft-tissue]
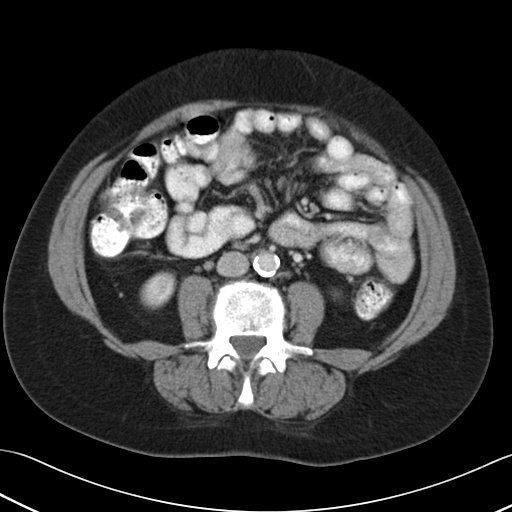
[im 36/56  soft-tissue]
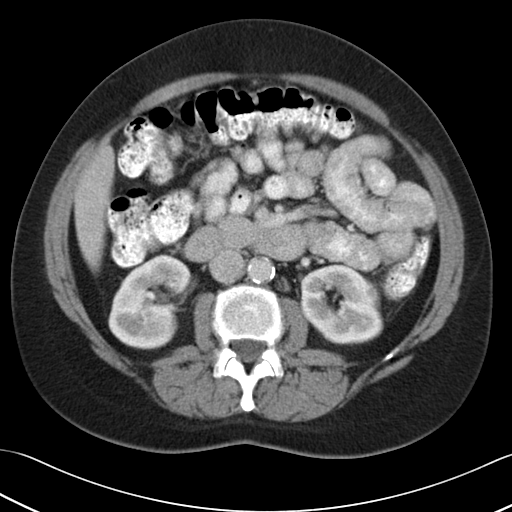
[im 36/56  bone]
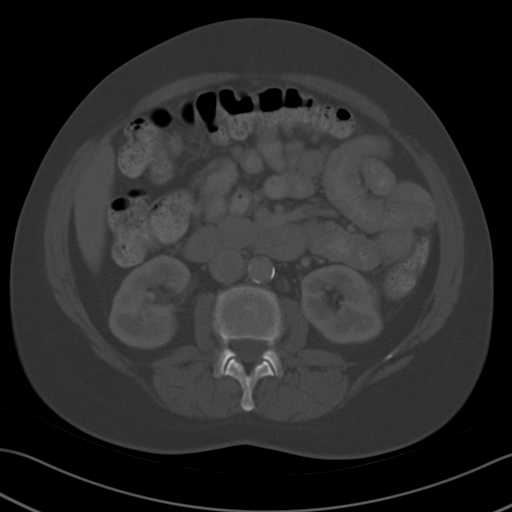
[im 40/56  soft-tissue]
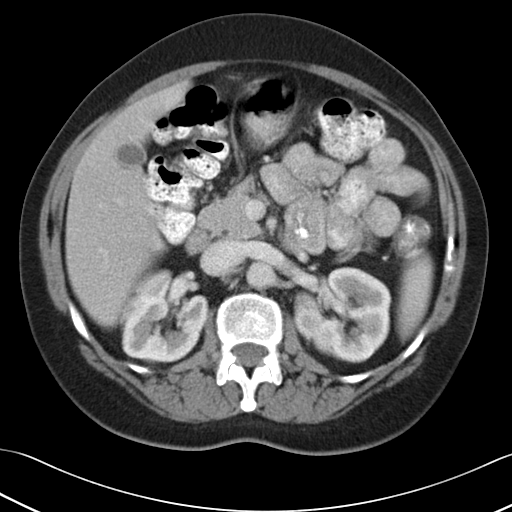
[im 45/56  soft-tissue]
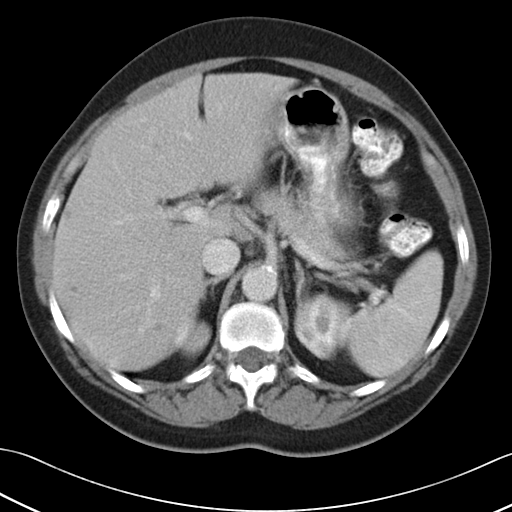
[im 47/56  lung]
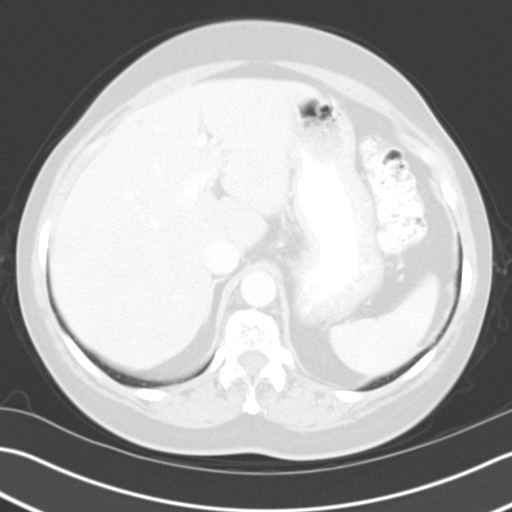
[im 49/56  soft-tissue]
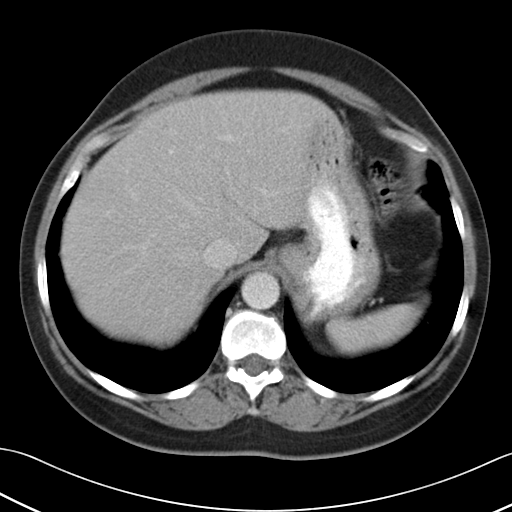
[im 49/56  lung]
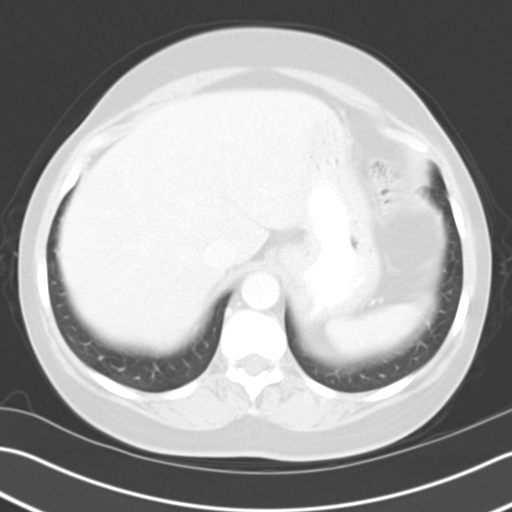
[im 51/56  lung]
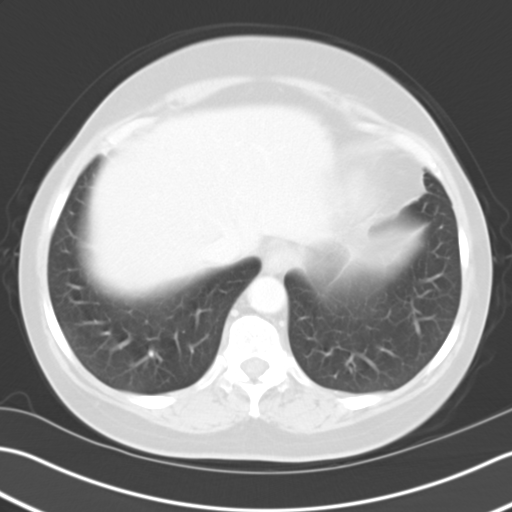
[im 53/56  soft-tissue]
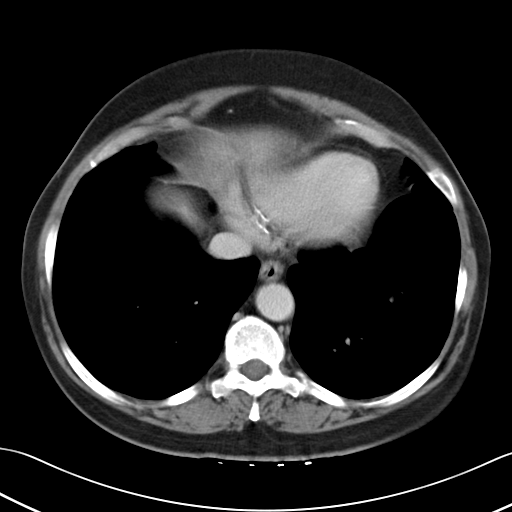
[im 53/56  lung]
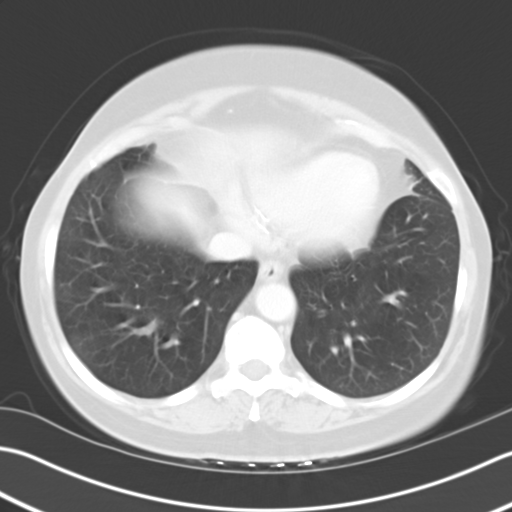

[15 of 32 positions shown; findings below may reference images not displayed]

PROCEDURE:     CT  - CT ABDOMEN / PELVIS  W  - January 30, 2008  [DATE]

RESULT:     CT of the abdomen and pelvis is performed utilizing oral
contrast along with 80 ml of [id number] iodinated intravenous contrast.
There is no previous exam for comparison.

There are tiny low attenuation areas within the liver suggestive of cyst but
these are too small for accurate characterization. Delayed images were not
obtained. These are present in the LEFT and RIGHT lobes. There is no biliary
ductal distention. The gallbladder is present with no stones. The spleen,
pancreas, adrenal glands and kidneys appear to be unremarkable. The
abdominal aorta is within normal limits with a moderate amount of
atherosclerotic calcification in the aorta extending into the iliac
arteries. There is a RIGHT ovarian cyst measuring 5.7 x 3.9 cm. There is no
free fluid. A small LEFT ovarian cyst is present. The appendix is normal.
There is no abnormal bowel distention or bowel wall thickening evident.
Images through the base of the lungs demonstrate no mass, infiltrate,
effusion or pneumothorax.
IMPRESSION: 1. Small hepatic low densities likely cysts.
2. Moderately prominent atherosclerotic calcification.
3. Large RIGHT ovarian cyst. Sonographic follow-up is recommended.

## 2008-10-08 ENCOUNTER — Ambulatory Visit: Payer: Self-pay | Admitting: Obstetrics & Gynecology

## 2008-10-14 ENCOUNTER — Ambulatory Visit: Payer: Self-pay | Admitting: Obstetrics & Gynecology

## 2008-12-26 ENCOUNTER — Ambulatory Visit: Payer: Self-pay | Admitting: Family Medicine

## 2009-04-09 ENCOUNTER — Ambulatory Visit: Payer: Self-pay | Admitting: Specialist

## 2009-07-10 IMAGING — CR DG SHOULDER 3+V*R*
1 series · 3 of 3 positions shown · non-contrast
Comparison: none

REASON FOR EXAM: Pain x 3 months
COMMENTS:

PROCEDURE:     DXR - DXR SHOULDER RIGHT COMPLETE  - December 26, 2008 [DATE]
RESULT:     Images of the right shoulder demonstrate no definite fracture,
dislocation or radiopaque foreign body.

[Series 1: view not recorded · 0.17mm/px · 3 of 3 slices shown]
[im 1/3]
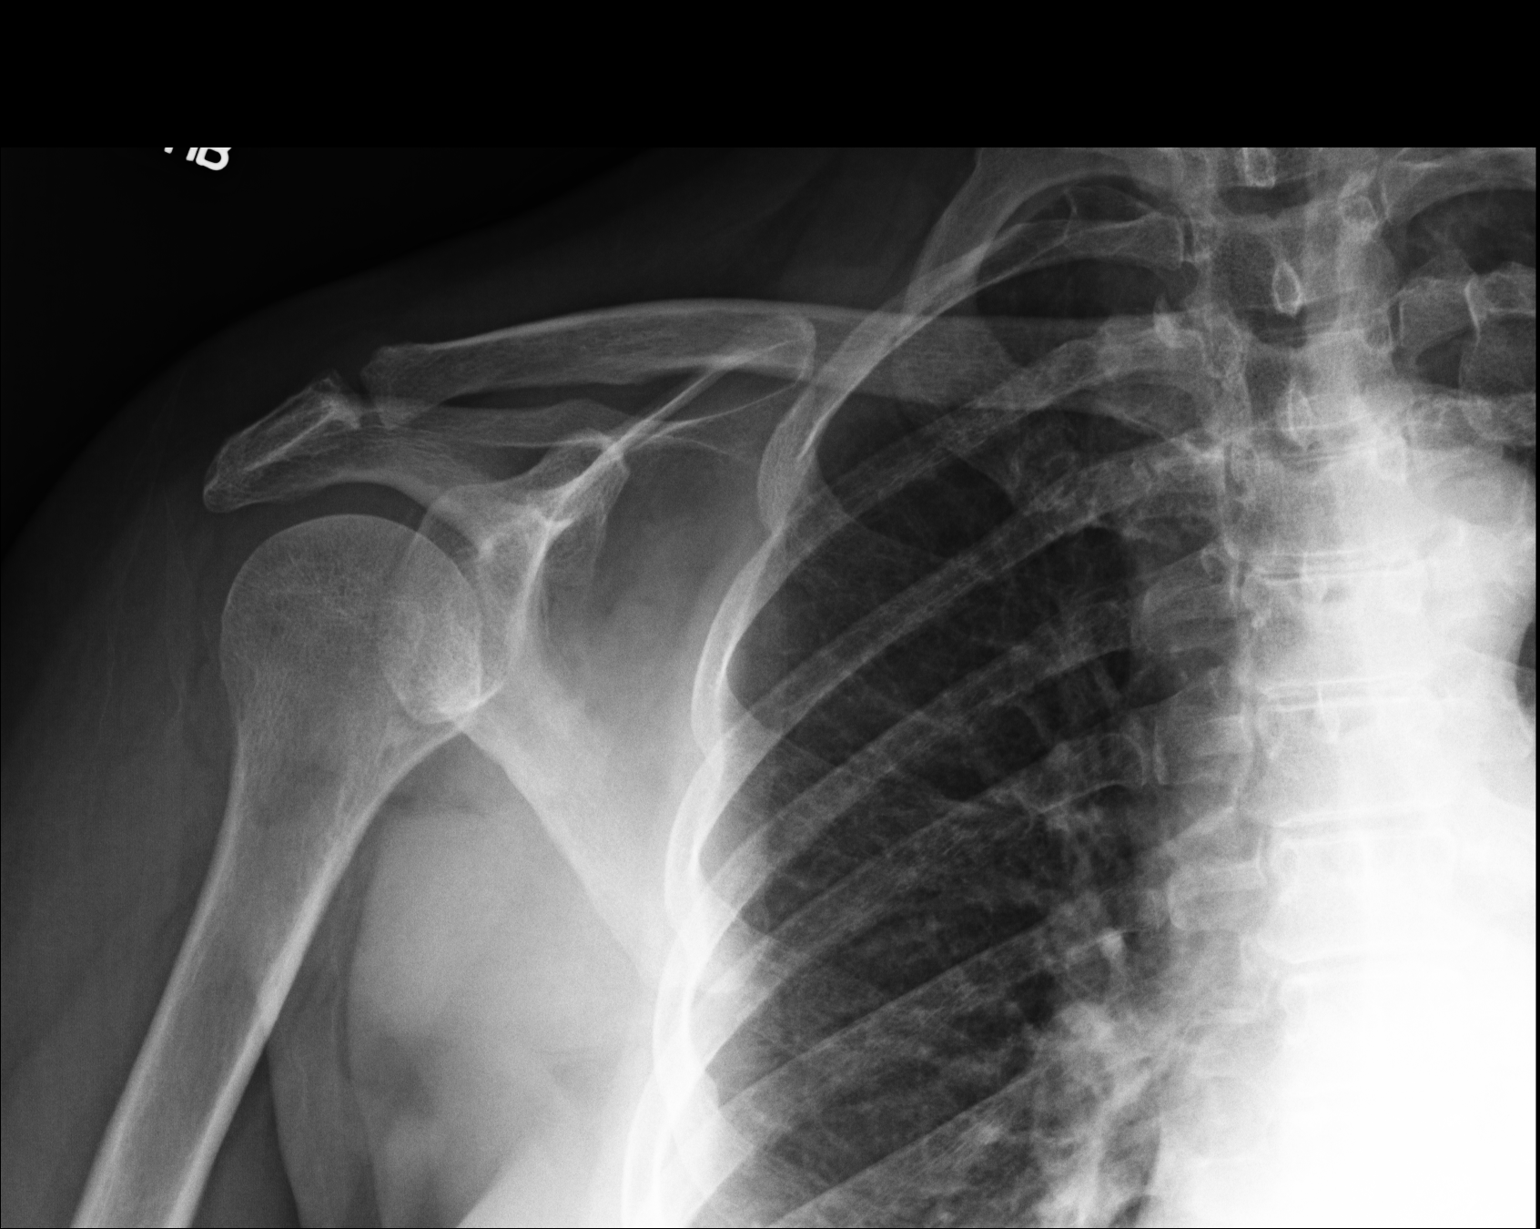
[im 2/3]
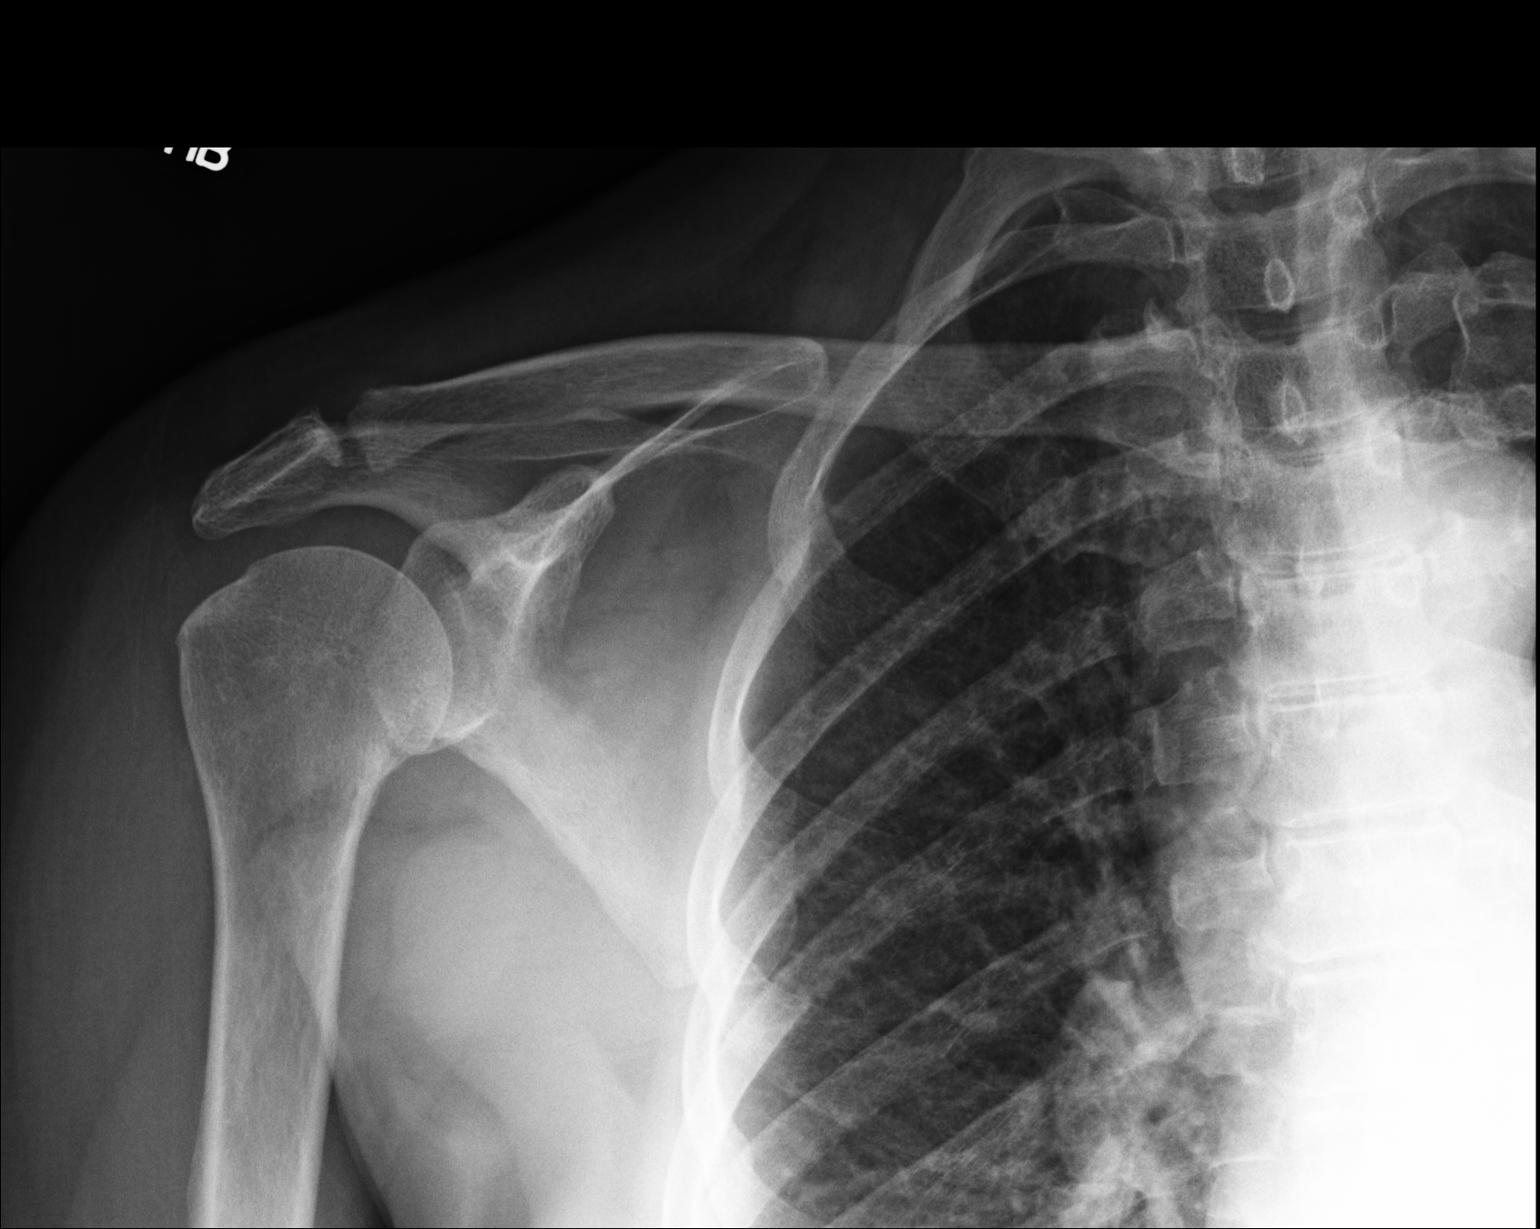
[im 3/3]
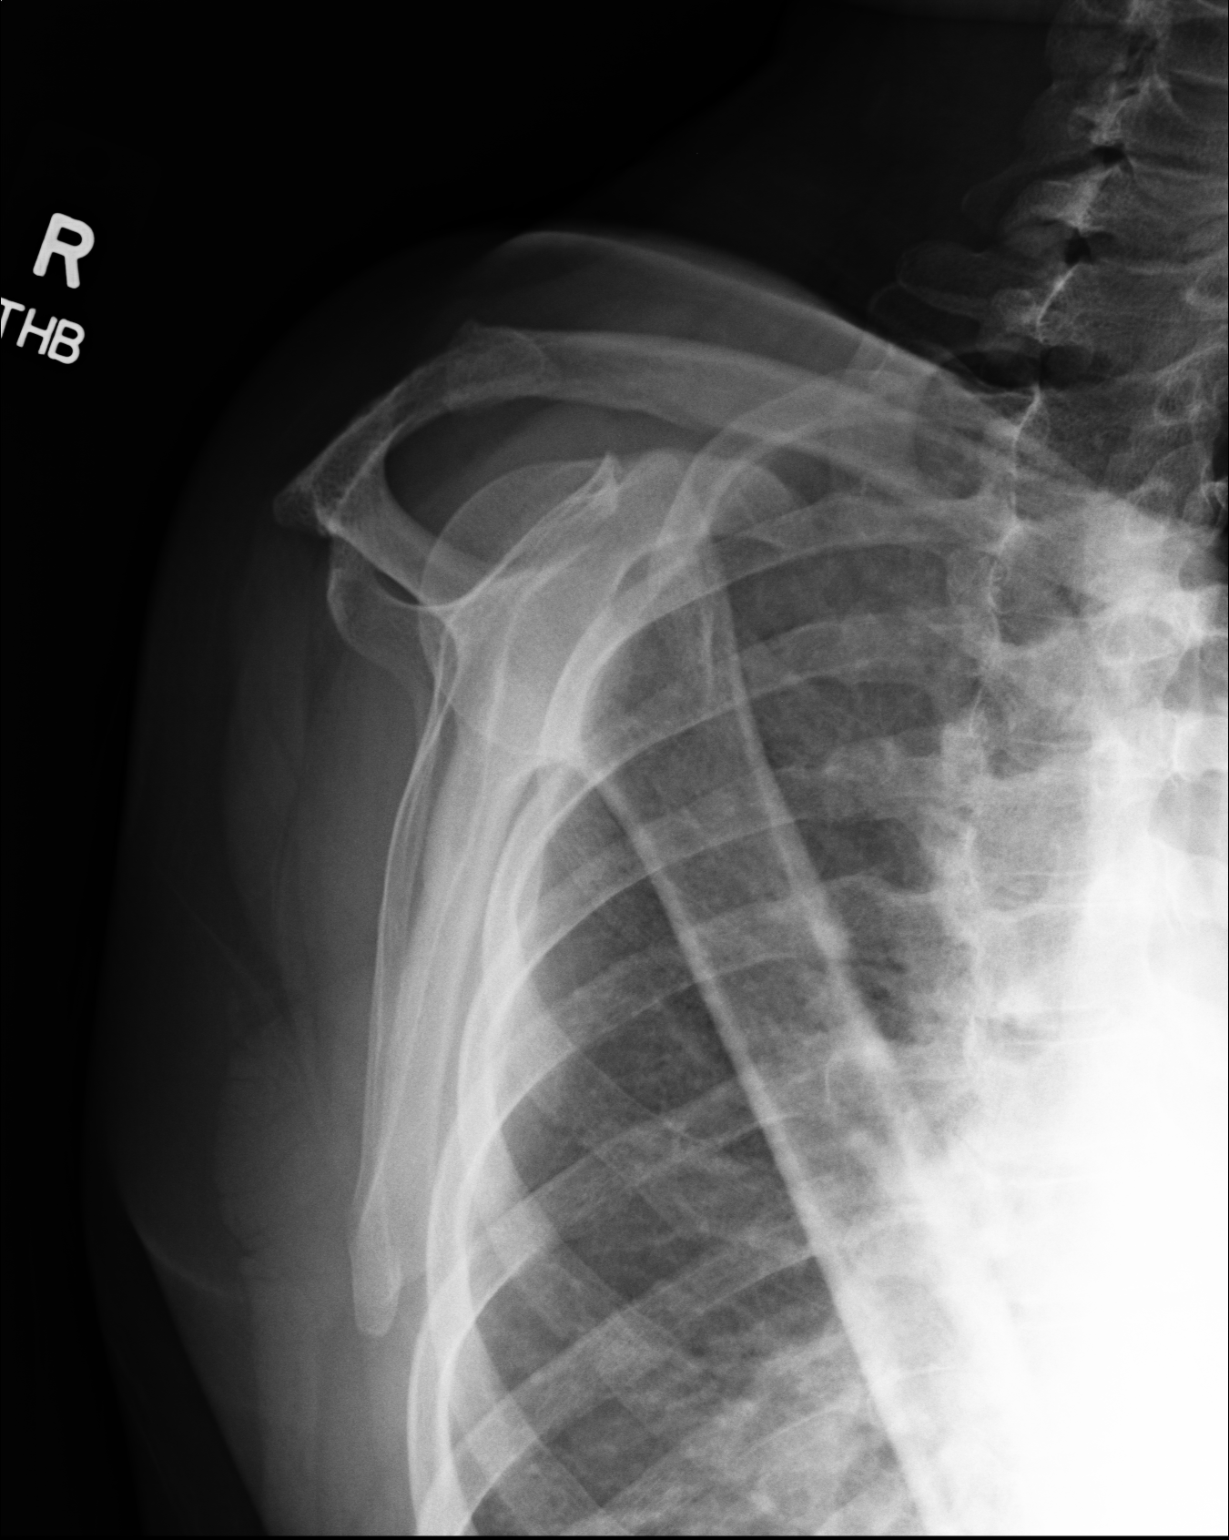

[3 of 3 positions shown; findings below may reference images not displayed]

IMPRESSION: Please see above.

## 2009-07-16 DIAGNOSIS — B029 Zoster without complications: Secondary | ICD-10-CM | POA: Insufficient documentation

## 2009-08-05 IMAGING — MR MRI OF THE RIGHT SHOULDER WITHOUT CONTRAST
6 series · 40 of 40 positions shown · non-contrast
Comparison: none

REASON FOR EXAM: Rt Shoulder Pain
COMMENTS:  [phone] home or work [phone]

[Series 4: T2 · axial · 4.0mm · 0.62mm/px · z∈[-26,+150]mm · 8 of 22 slices shown (1 of 2)]
[im 1/22]
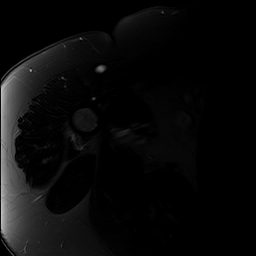
[im 4/22]
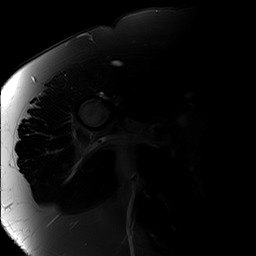
[im 7/22]
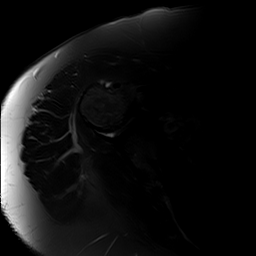
[im 10/22]
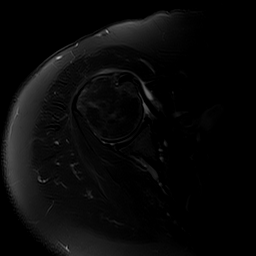
[im 13/22]
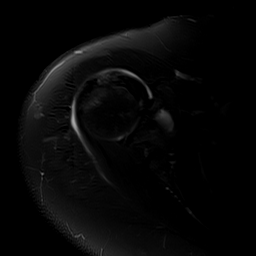
[im 16/22]
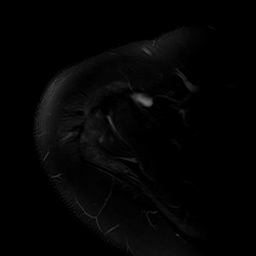
[im 19/22]
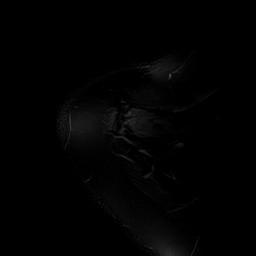
[im 22/22]
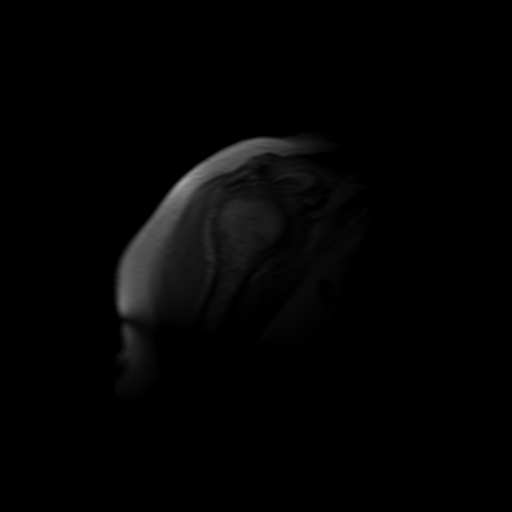

[Series 16: t1_sag · axial · 4.0mm · 0.62mm/px · z∈[+14,+83]mm · 7 of 20 slices shown]
[im 1/20]
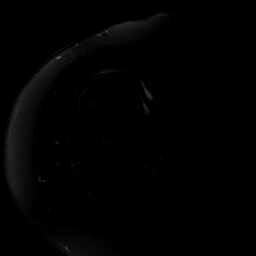
[im 4/20]
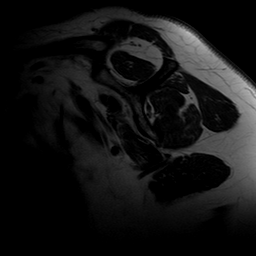
[im 7/20]
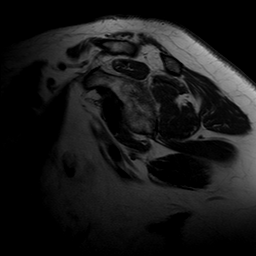
[im 10/20]
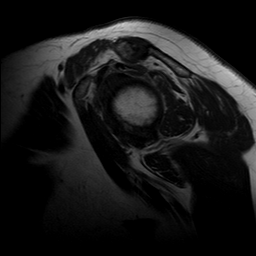
[im 13/20]
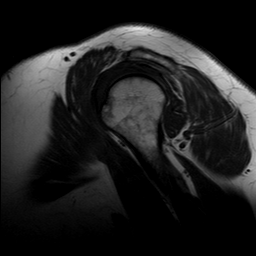
[im 16/20]
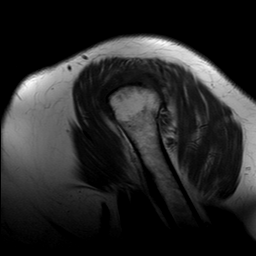
[im 20/20]
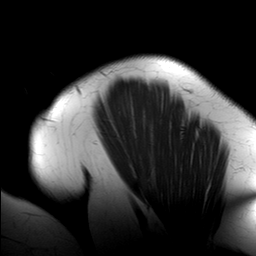

[Series 5002: pd_ax fatsat_fil_1 · axial · 4.0mm · 0.62mm/px · z∈[-26,+150]mm · 7 of 22 slices shown]
[im 1/22]
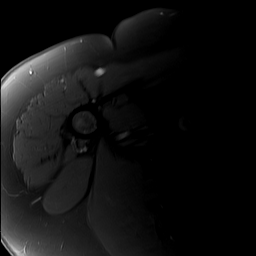
[im 4/22]
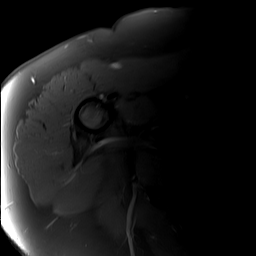
[im 8/22]
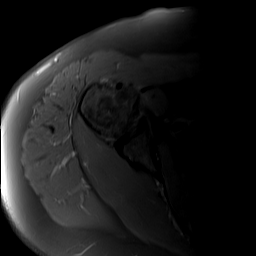
[im 11/22]
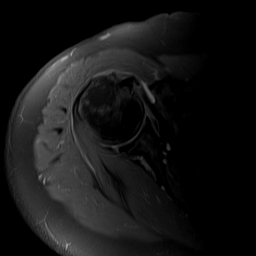
[im 15/22]
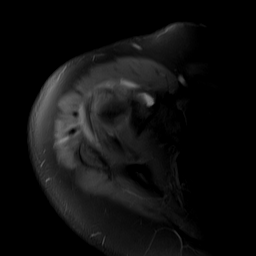
[im 18/22]
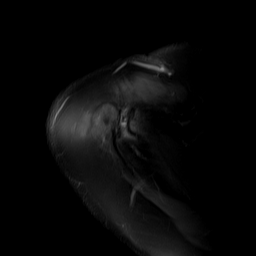
[im 22/22]
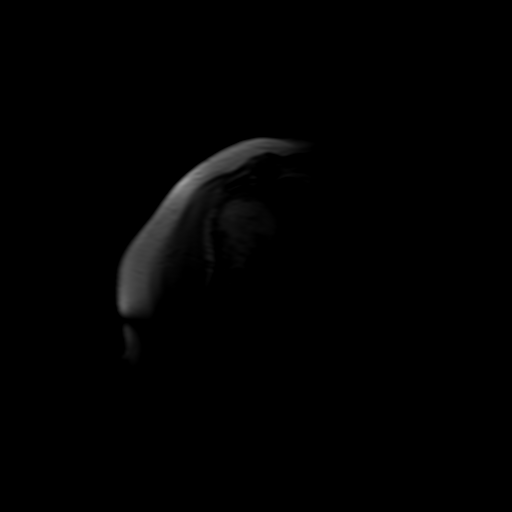

[Series 5004: T2 · axial · 4.0mm · 0.62mm/px · z∈[+14,+82]mm · 6 of 20 slices shown (2 of 2)]
[im 1/20]
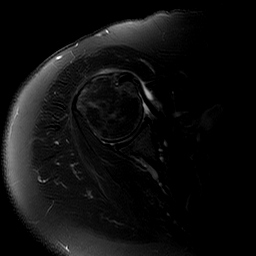
[im 4/20]
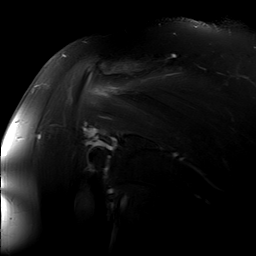
[im 8/20]
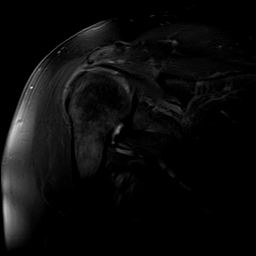
[im 12/20]
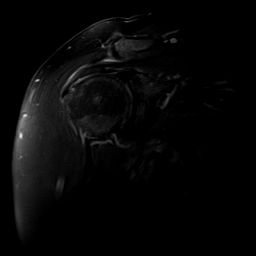
[im 16/20]
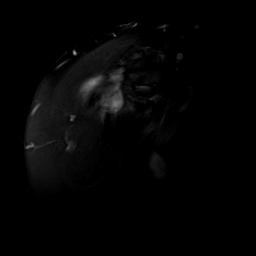
[im 20/20]
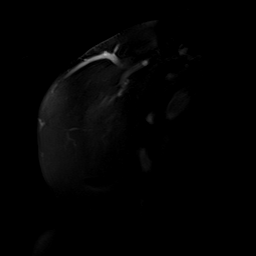

[Series 5006: PD · axial · 4.0mm · 0.62mm/px · z∈[+14,+82]mm · 6 of 20 slices shown]
[im 1/20]
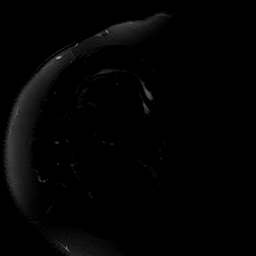
[im 4/20]
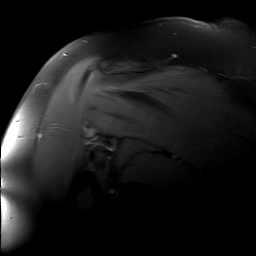
[im 8/20]
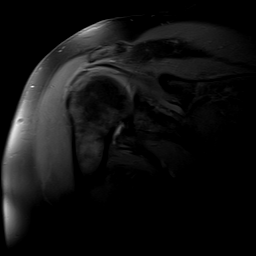
[im 12/20]
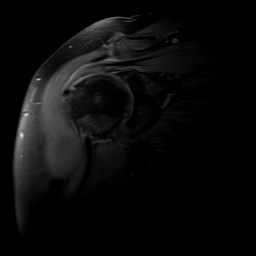
[im 16/20]
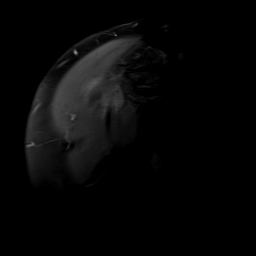
[im 20/20]
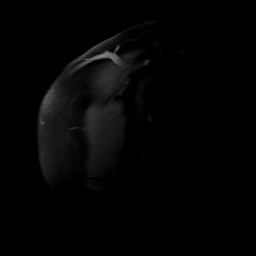

[Series 5009: T2 fat-sat · axial · 4.0mm · 0.62mm/px · z∈[+14,+83]mm · 6 of 20 slices shown]
[im 1/20]
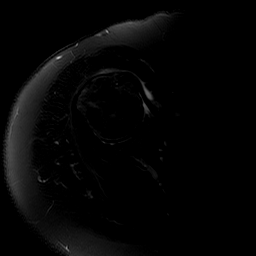
[im 4/20]
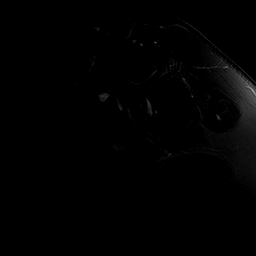
[im 8/20]
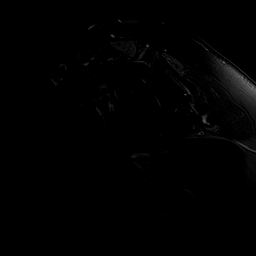
[im 12/20]
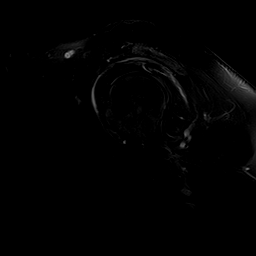
[im 16/20]
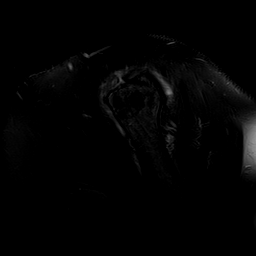
[im 20/20]
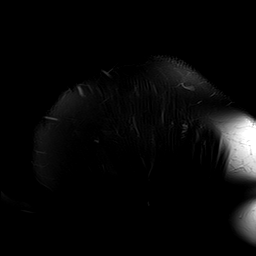

[40 of 40 positions shown; findings below may reference images not displayed]

PROCEDURE:     MR  - MR SHOULDER RT  WO CONTRAST  - April 09, 2009  [DATE]

RESULT:     Multiplanar/multisequence imaging of the RIGHT shoulder was
obtained without the administration of gadolinium.

Evaluation of the osseous structures demonstrates degenerative changes
evident by osteophytosis along the humeral head as well as involving the
acromioclavicular joint.  There is impingement anatomy involving the
acromioclavicular joint. The supraspinatus tendon is diffusely thickened.
There is increased T2 signal along the subacromion and subdeltoid bursal
regions as well as along the articular surface.  Increased T2 signal
projects within the substance of the tendon at the musculotendinous
junction.  There is no evidence of muscle or tendon retraction.  The biceps
tendon is appreciated within the bicipital groove. The subscapularis is
mildly thickened with fluid appreciated within the tendon sheath. The
infraspinatus and teres minor are intact. Note; along the distal anterior
portion of the supraspinatus tendon an area of horizontally oriented T2
signal appears to traverse the substance of the tendon.
IMPRESSION: 1. Tendinosis involving the supraspinatus tendon with areas of
intrasubstance tearing as well as areas concerning for high grade partial
thickness versus full thickness tearing particularly along the anterior
distal portion of the tendon.

2. Degenerative changes involving the humeral head and acromioclavicular
joint.

3. Findings also concerning for tendinosis involving the subscapularis
tendon.

## 2009-09-01 ENCOUNTER — Ambulatory Visit: Payer: Self-pay | Admitting: Family Medicine

## 2009-10-17 ENCOUNTER — Emergency Department: Payer: Self-pay | Admitting: Emergency Medicine

## 2009-11-27 ENCOUNTER — Ambulatory Visit: Payer: Self-pay | Admitting: Family Medicine

## 2010-03-16 IMAGING — CR DG CHEST 2V
1 series · 2 of 2 positions shown · non-contrast
Comparison: none

REASON FOR EXAM: chest pain, shortness of breath
COMMENTS:

[Series 1: view not recorded · 0.17mm/px · 2 of 2 slices shown]
[im 1/2]
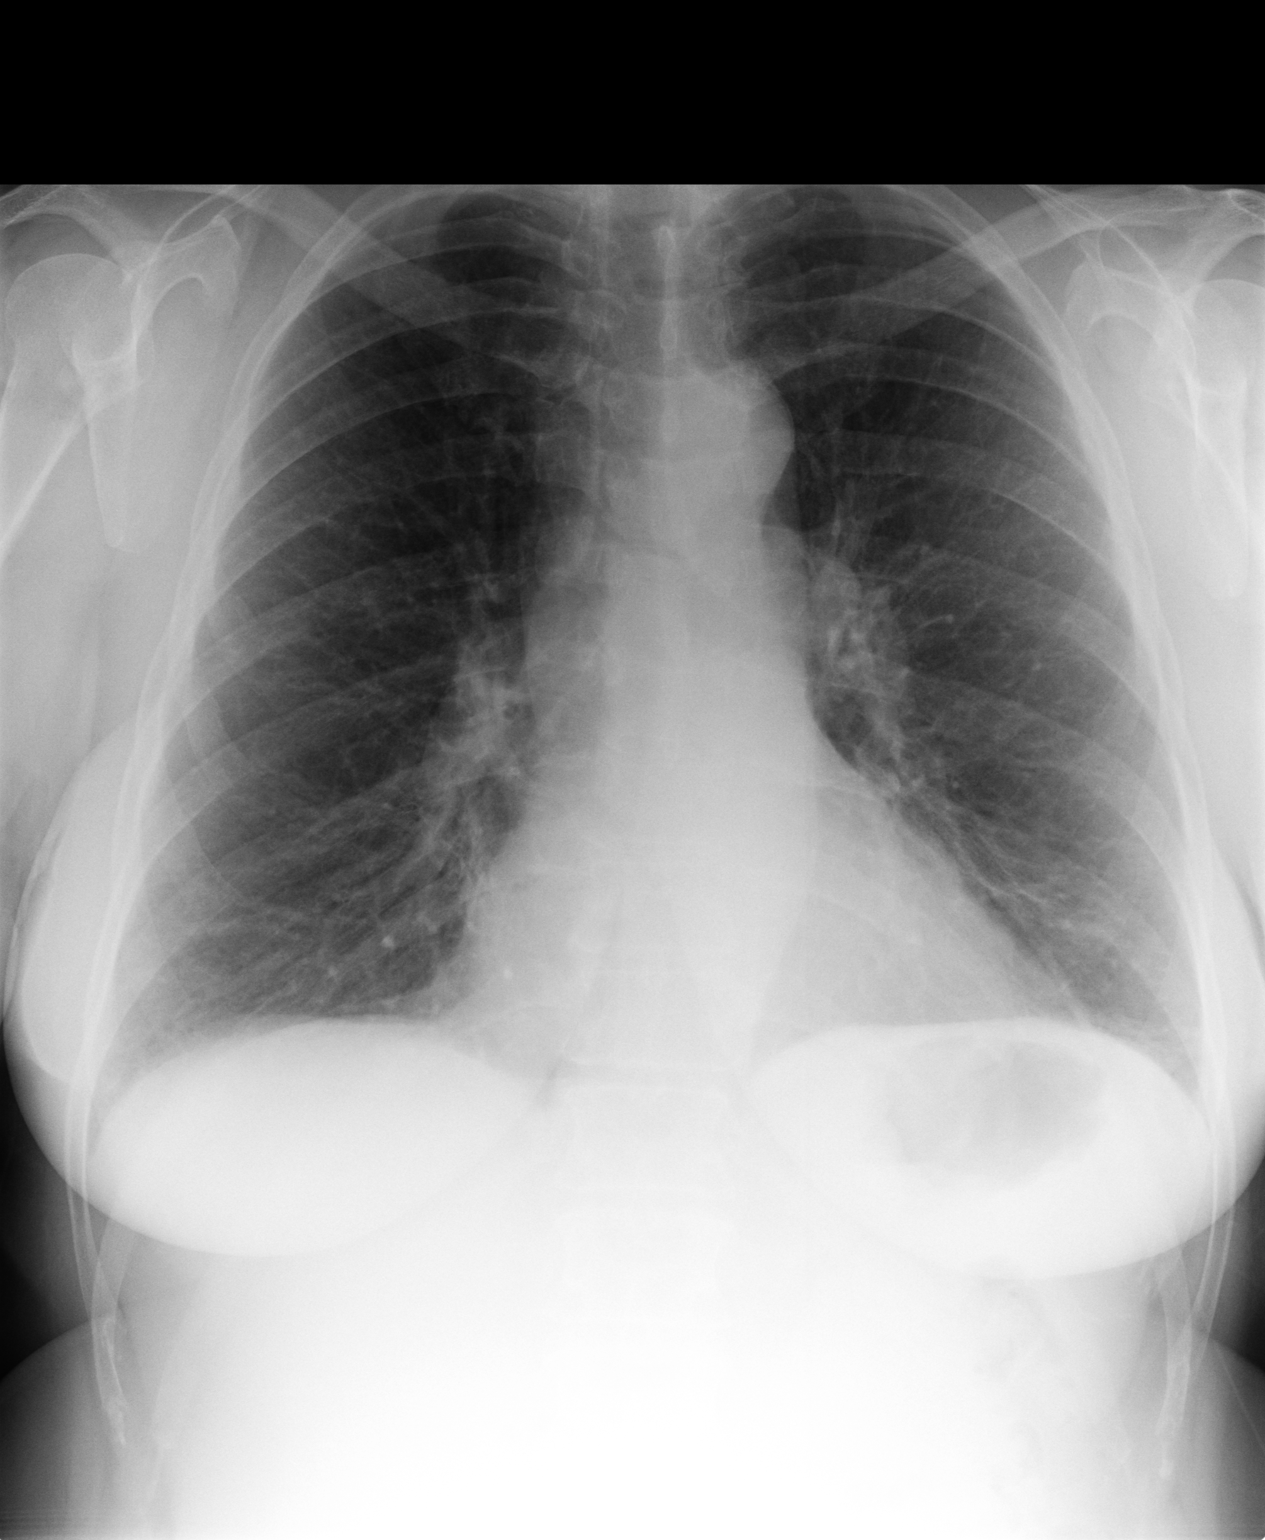
[im 2/2]
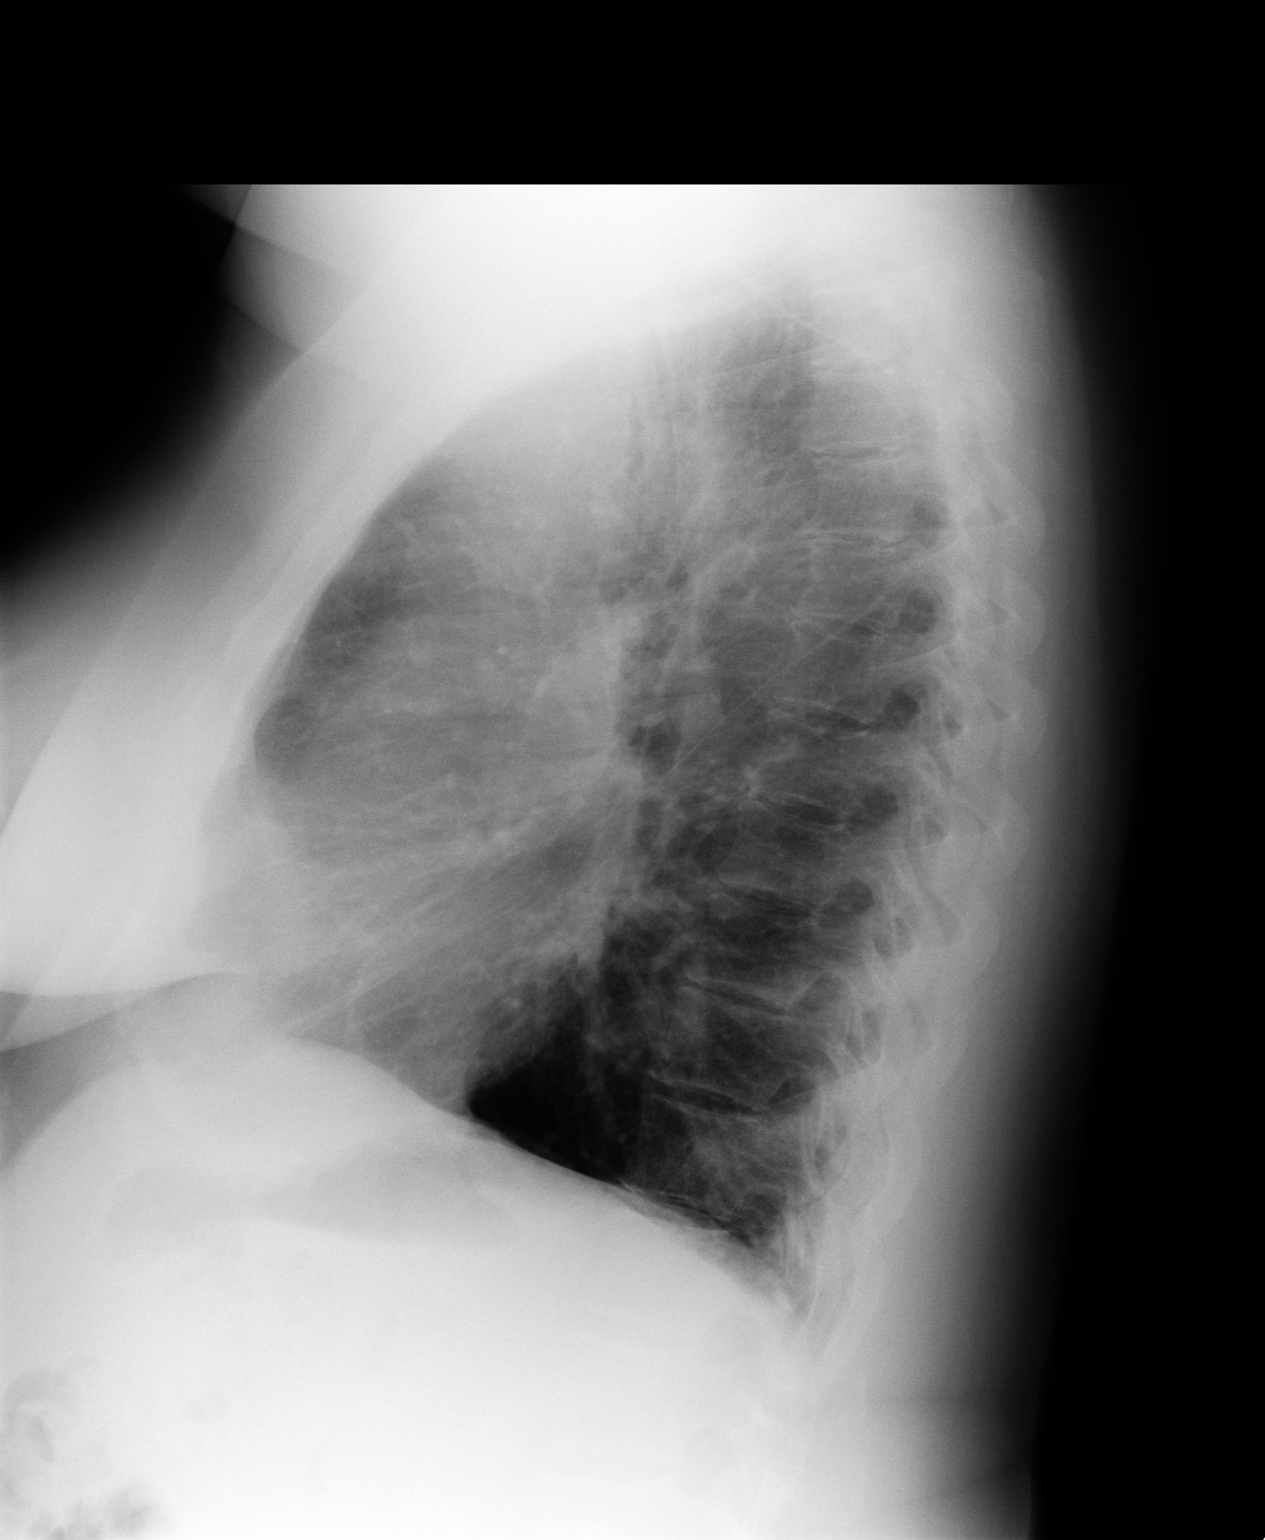

[2 of 2 positions shown; findings below may reference images not displayed]

PROCEDURE:     KDR - KDXR CHEST PA (OR AP) AND LAT  - September 01, 2009 [DATE]

RESULT:     Comparison is made to the study of 06/04/2007.

Linear fibrosis or atelectasis is present at the lung bases. The cardiac
silhouette is normal. There is no edema, infiltrate, effusion or
pneumothorax. There is no underlying mass.
IMPRESSION: Basilar atelectasis or fibrosis bilaterally.

## 2010-06-11 IMAGING — CR DG CHEST 2V
1 series · 2 of 2 positions shown · non-contrast
Comparison: none

REASON FOR EXAM: SOB
COMMENTS:

[Series 1: view not recorded · 0.17mm/px · 2 of 2 slices shown]
[im 1/2]
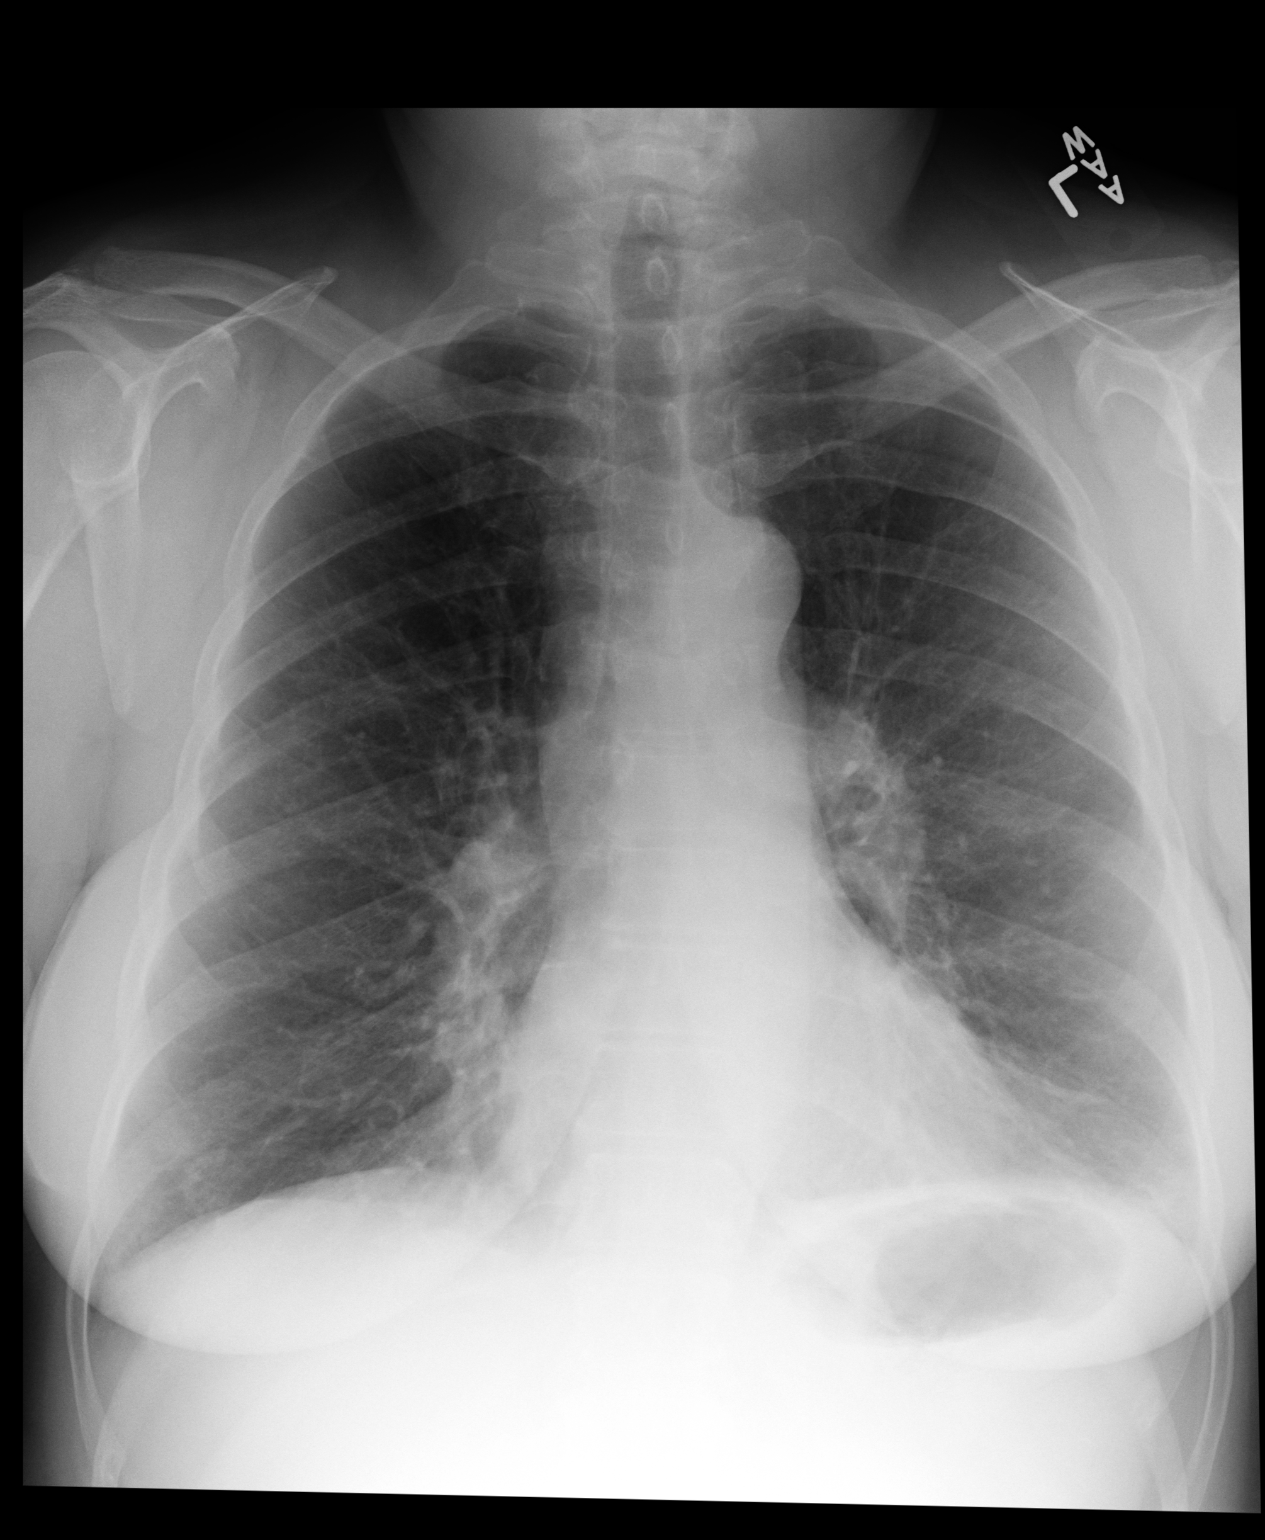
[im 2/2]
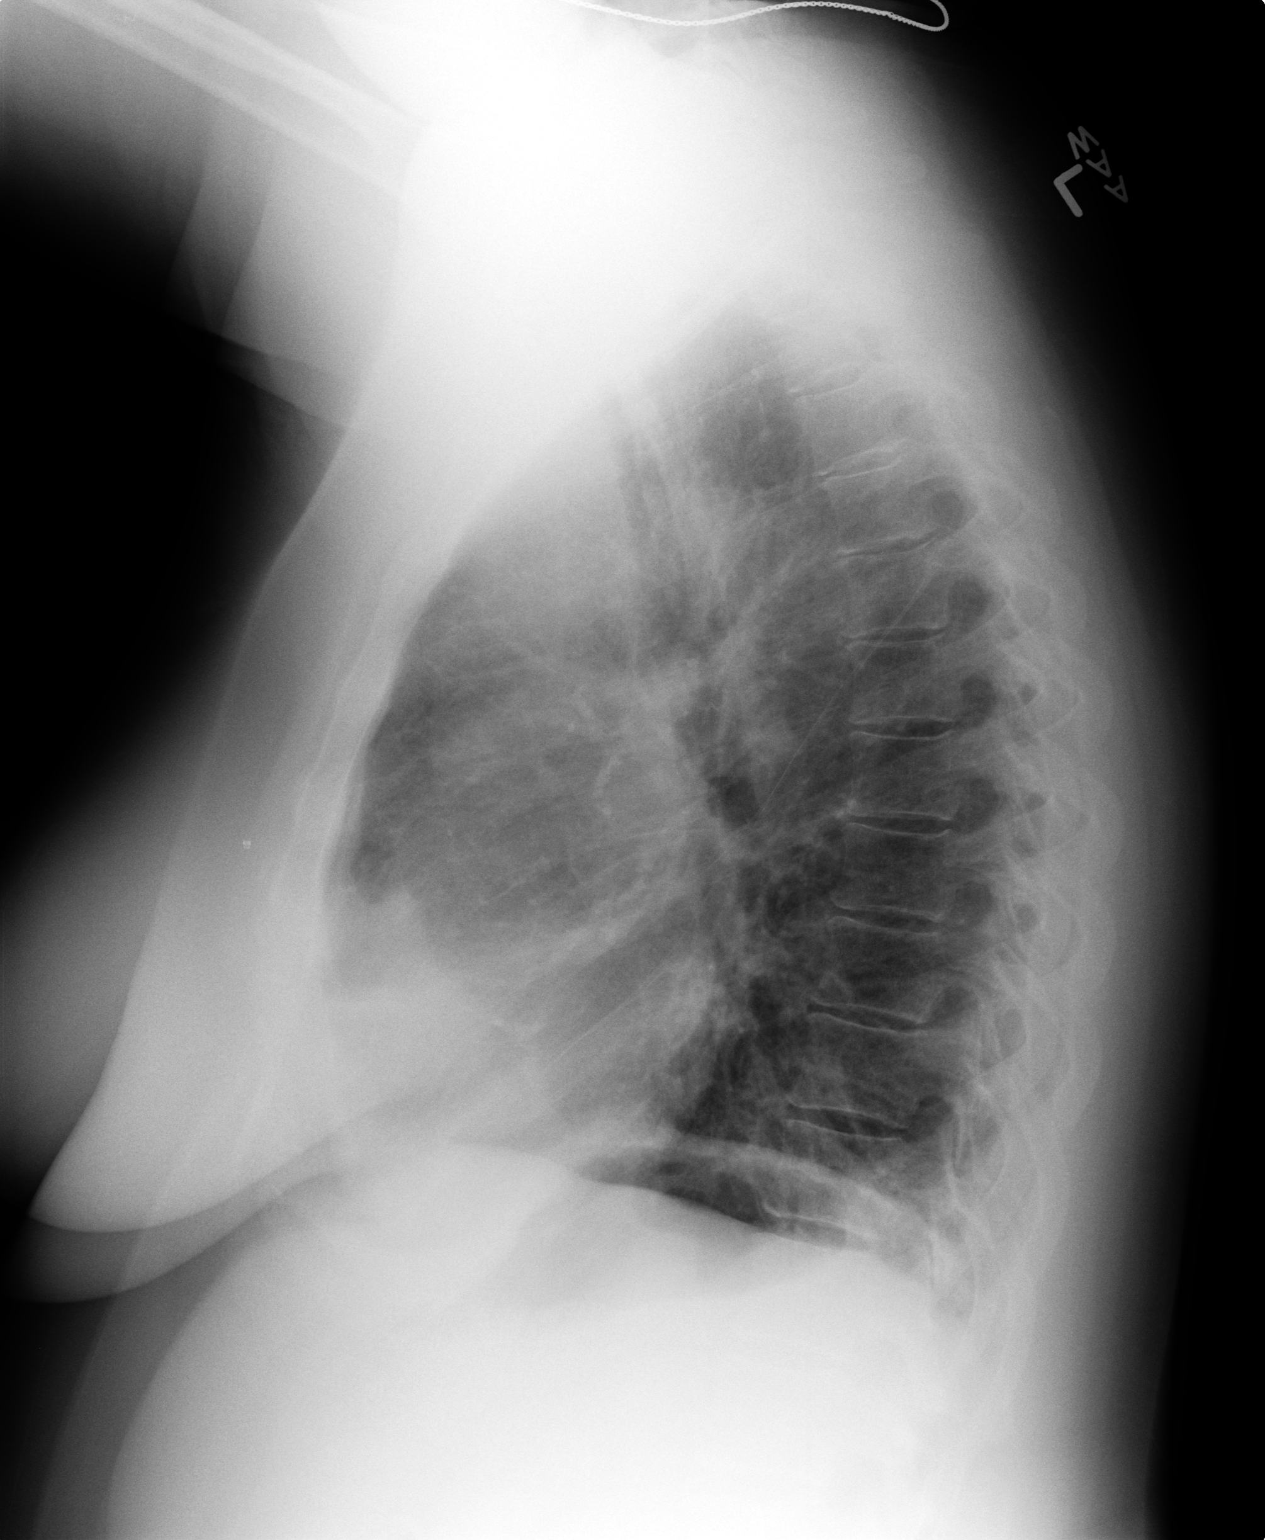

[2 of 2 positions shown; findings below may reference images not displayed]

PROCEDURE:     KDR - KDXR CHEST PA (OR AP) AND LAT  - November 27, 2009 [DATE]

RESULT:     Comparison is made to study 01 September, 2009.

The lungs are hyperinflated. The cardiac silhouette is normal in size. The
interstitial markings are minimally prominent but not greatly changed. There
are coarse lung markings at the left lung base. The pulmonary vascularity is
not engorged.
IMPRESSION: The findings are consistent with reactive airway disease or
COPD. There is likely superimposed acute bronchitis. I see no focal
pneumonia. Minimal basilar atelectasis is suspected on the left. Followup
films following therapy are recommended to assure clearing.

## 2010-06-15 ENCOUNTER — Emergency Department: Payer: Self-pay | Admitting: Emergency Medicine

## 2010-07-02 ENCOUNTER — Ambulatory Visit: Payer: Self-pay | Admitting: Family Medicine

## 2010-09-30 ENCOUNTER — Emergency Department: Payer: Self-pay | Admitting: Emergency Medicine

## 2010-10-17 HISTORY — PX: CARPAL TUNNEL RELEASE: SHX101

## 2010-10-28 IMAGING — CR LEFT THUMB 2+V
1 series · 3 of 3 positions shown · non-contrast
Comparison: none

REASON FOR EXAM: left thumb pain with trigger finger
COMMENTS:

PROCEDURE:     KDR - KDXR THUMB LEFT HAND (1ST DIGIT)  - July 02, 2010 [DATE]
RESULT:     Images of the left thumb demonstrates fracture in the anterior
aspect of the proximal third of the distal phalanx.

[Series 2: view not recorded · 0.17mm/px · 3 of 3 slices shown]
[im 1/3]
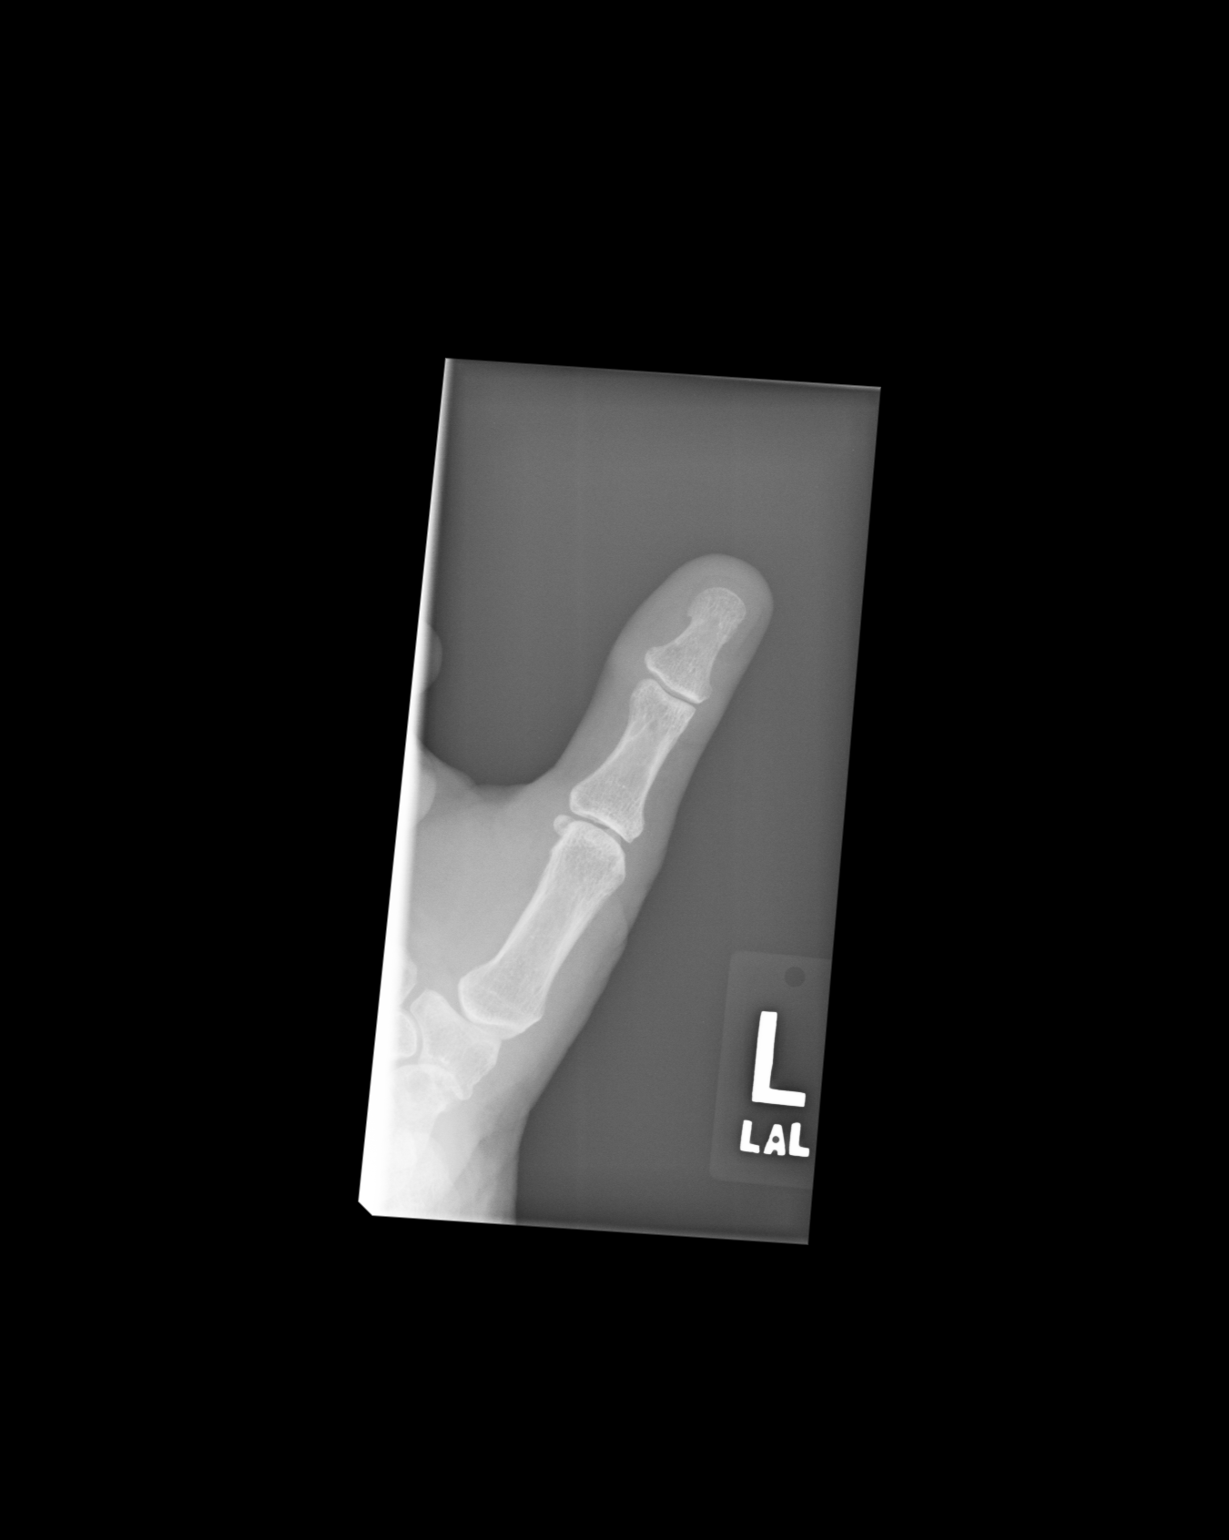
[im 2/3]
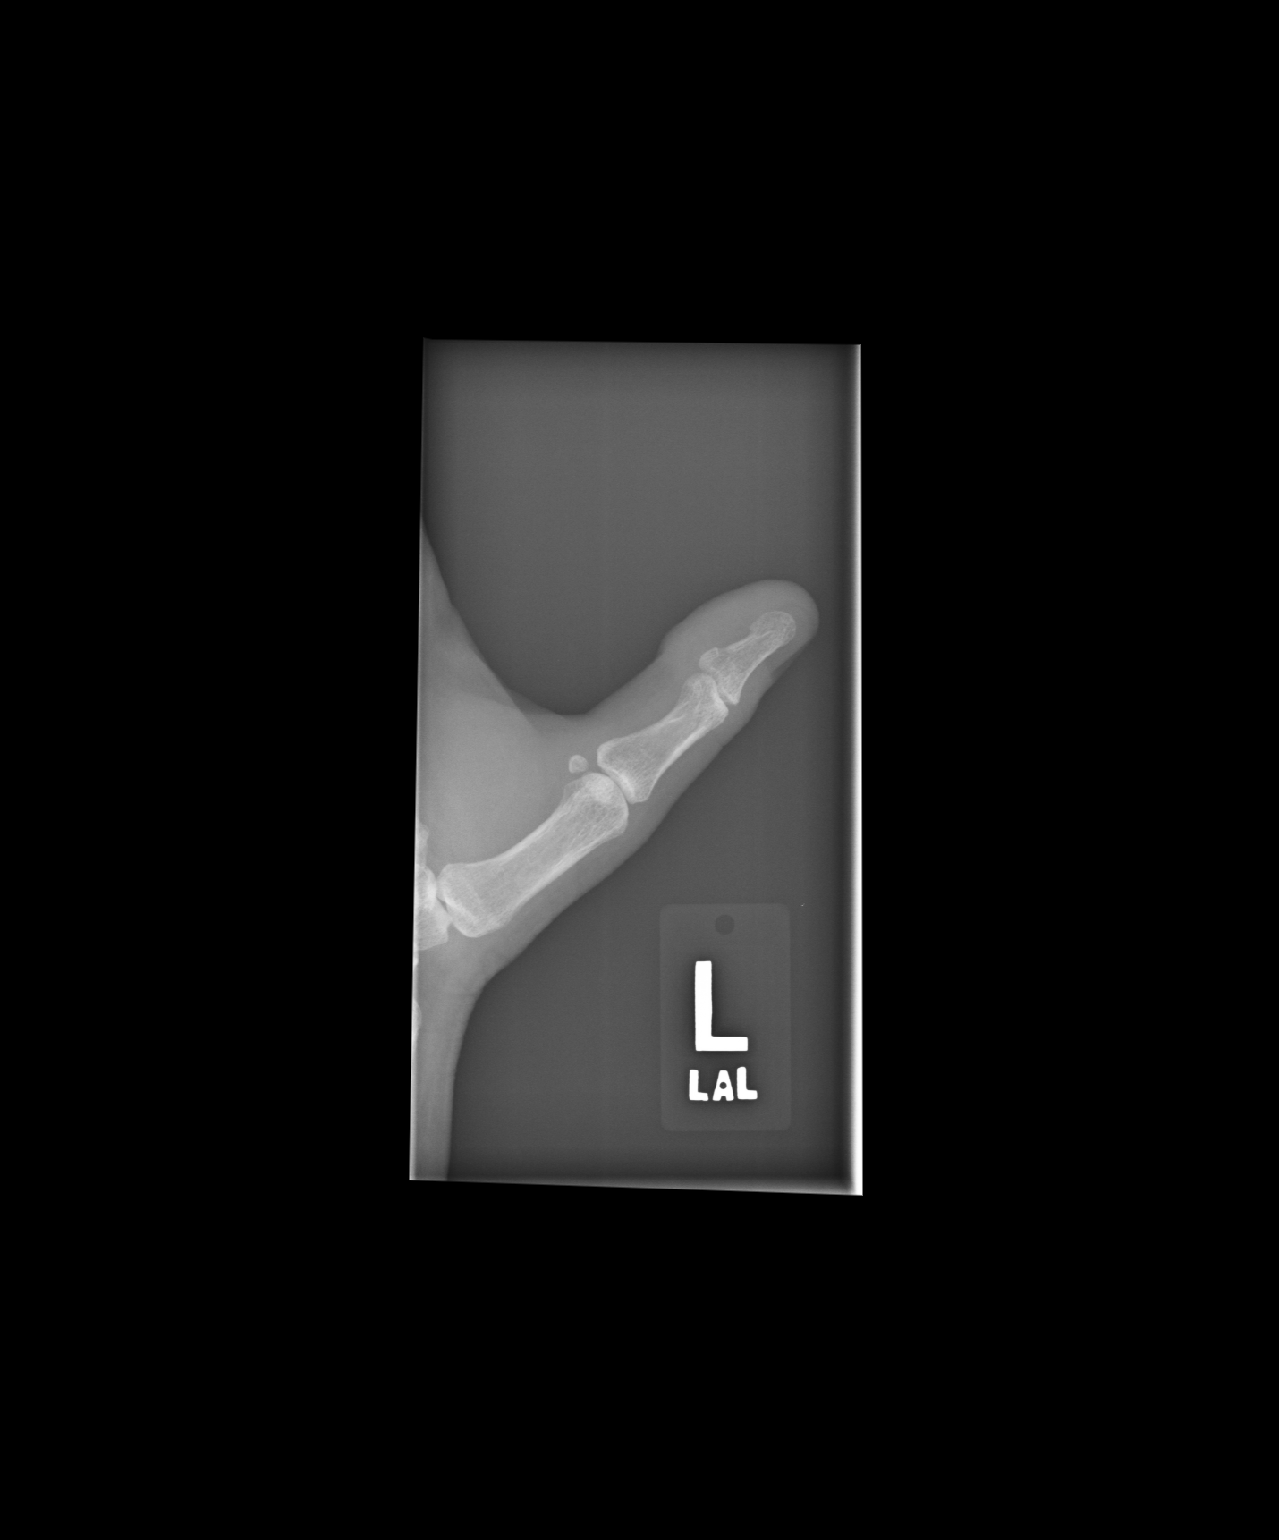
[im 3/3]
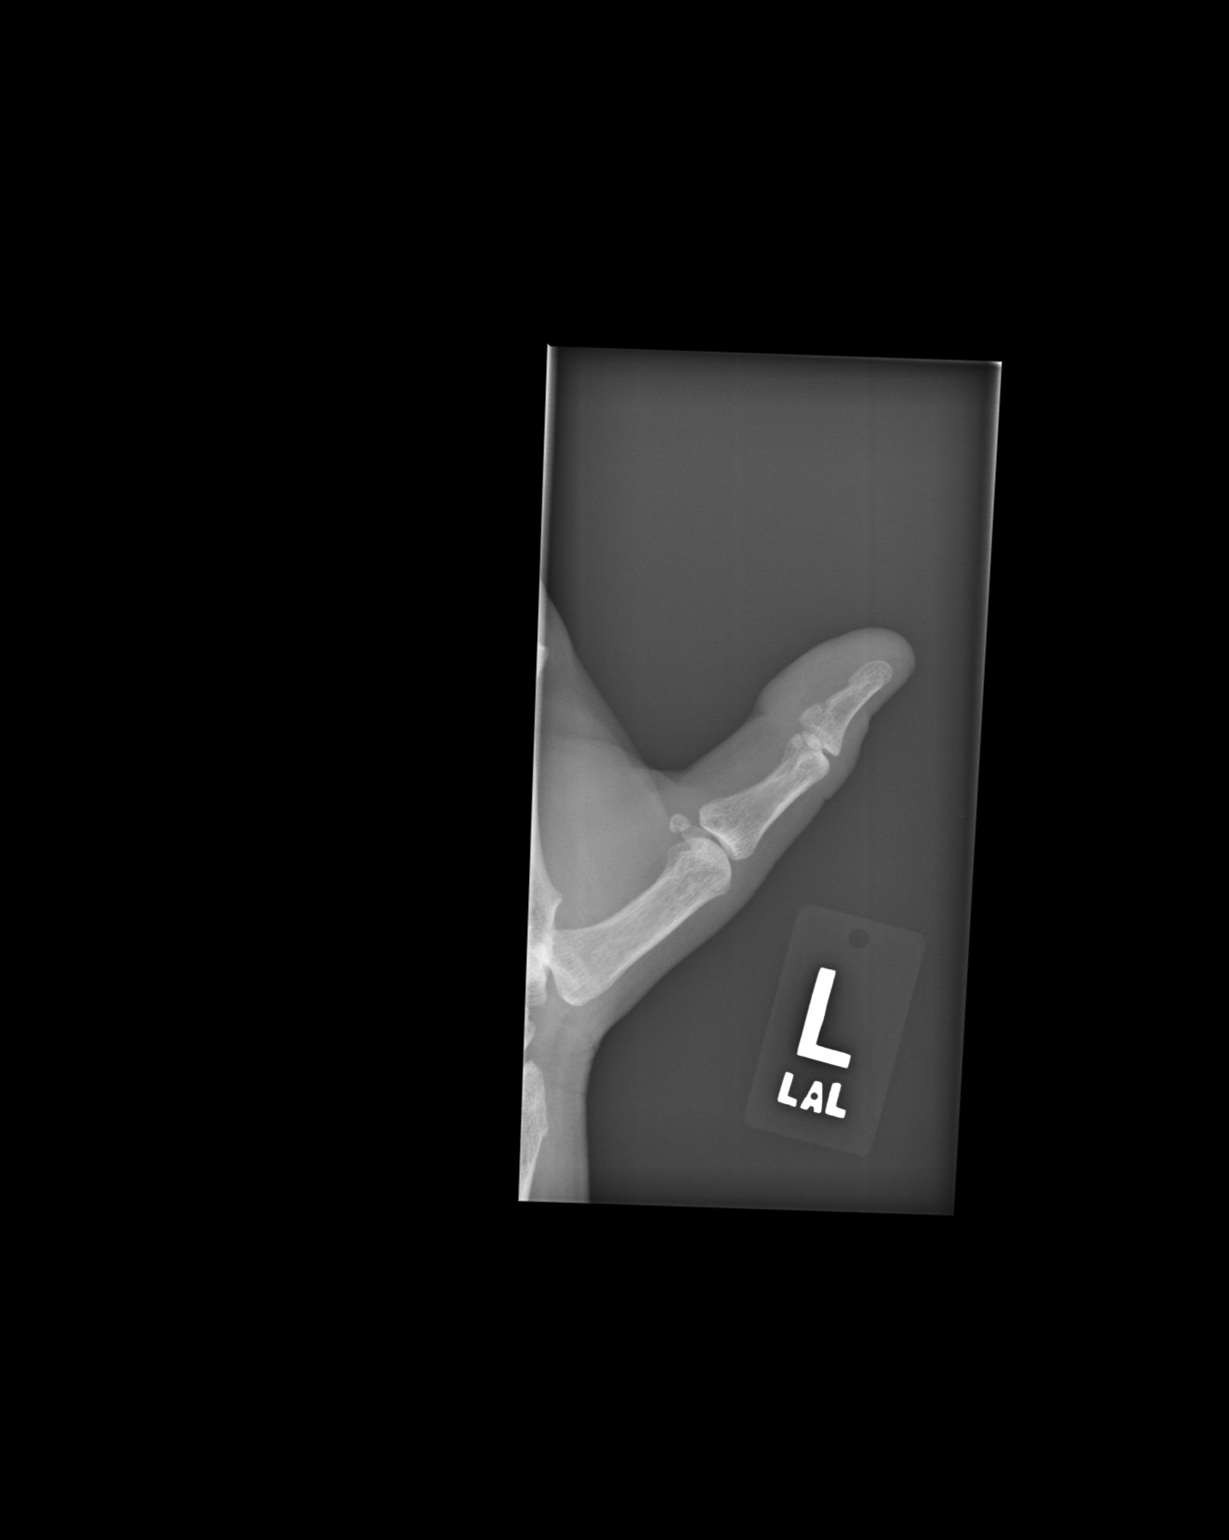

[3 of 3 positions shown; findings below may reference images not displayed]

IMPRESSION: A true lateral view was not obtained but it appears that there is fracture
in the proximal aspect of the distal phalanx. A sesamoid which was not
displaced off of this region on the lateral could give this appearance.

## 2010-11-24 ENCOUNTER — Ambulatory Visit: Payer: Self-pay | Admitting: Family Medicine

## 2011-01-26 IMAGING — MR MRI THORACIC SPINE WITHOUT AND WITH CONTRAST
7 series · 39 of 48 positions shown · IV contrast (18 ML MULTIHANCE)
Comparison: none

REASON FOR EXAM: severe pain with weakness right leg intermittent
COMMENTS:   May transport without cardiac monitor

[Series 4: T2 · sagittal · 4.0mm · 1.06mm/px · 3 of 14 slices shown (1 of 2)]
[im 1/14]
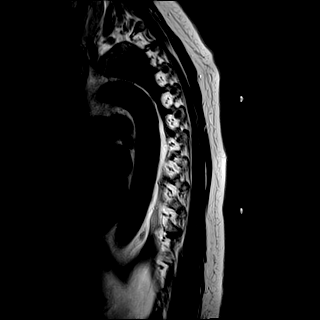
[im 7/14]
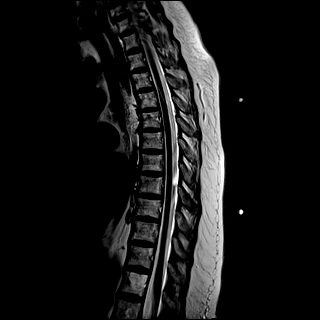
[im 14/14]
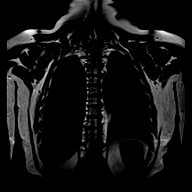

[Series 5: T1 · sagittal · 4.0mm · 1.06mm/px · 4 of 14 slices shown (1 of 4)]
[im 1/14]
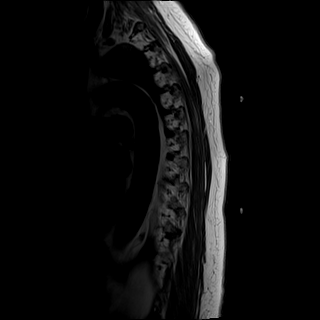
[im 5/14]
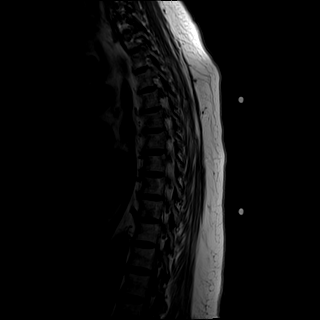
[im 9/14]
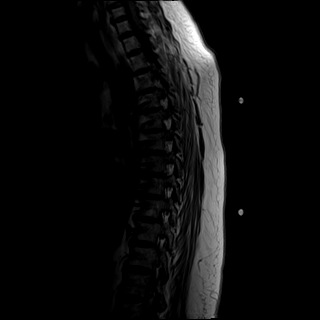
[im 14/14]
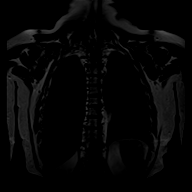

[Series 6: T2 fat-sat · sagittal · 4.0mm · 1.33mm/px · 1 of 14 slices shown]
[im 1/14]
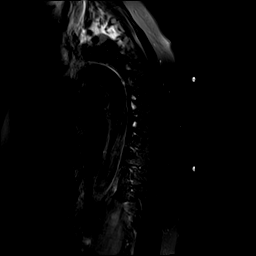

[Series 10: T1 · sagittal · 4.0mm · 1.06mm/px · 4 of 14 slices shown (2 of 4)]
[im 1/14]
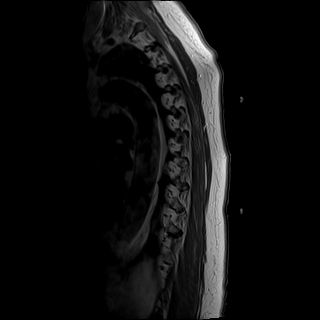
[im 5/14]
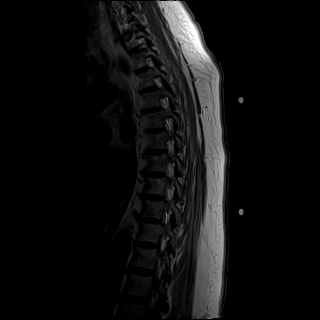
[im 9/14]
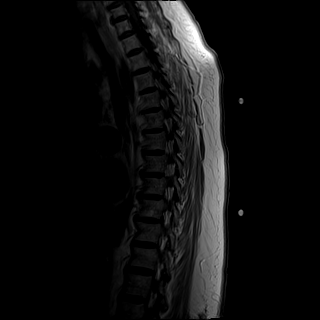
[im 14/14]
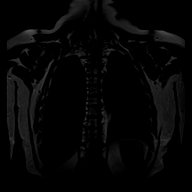

[Series 5005: T2 · axial · 5.0mm · 0.86mm/px · z∈[-231,+65]mm · 11 of 43 slices shown (2 of 2)]
[im 1/43]
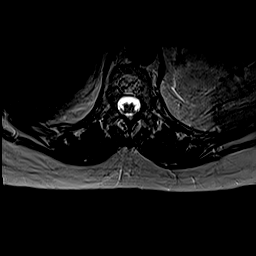
[im 5/43]
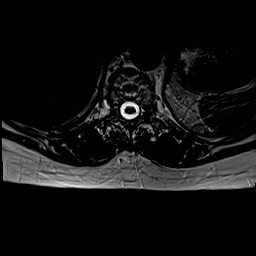
[im 9/43]
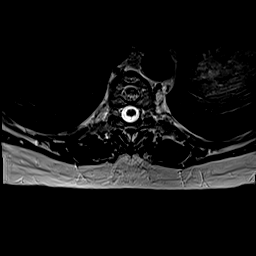
[im 13/43]
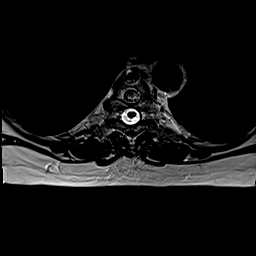
[im 17/43]
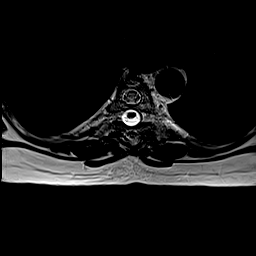
[im 22/43]
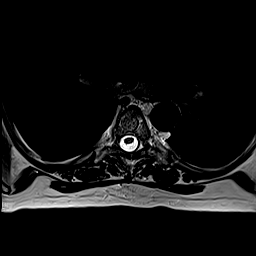
[im 26/43]
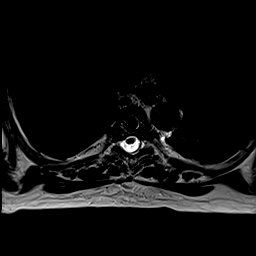
[im 30/43]
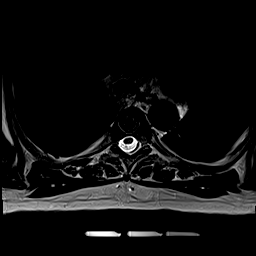
[im 34/43]
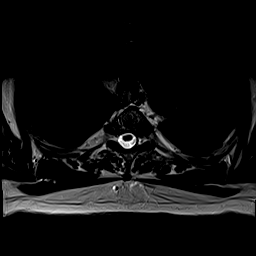
[im 38/43]
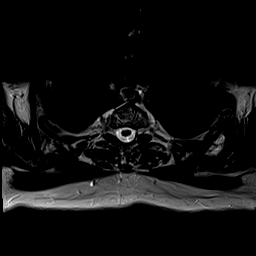
[im 43/43]
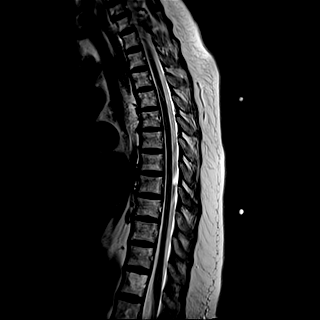

[Series 5007: T1 · axial · 5.0mm · 0.43mm/px · z∈[-232,+65]mm · 8 of 43 slices shown (3 of 4)]
[im 1/43]
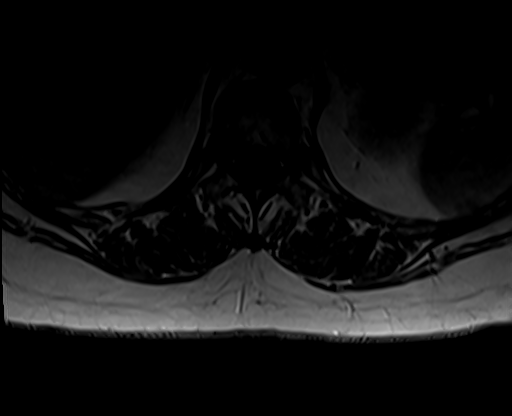
[im 9/43]
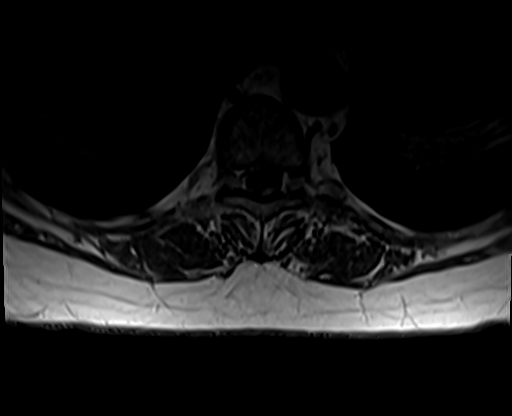
[im 13/43]
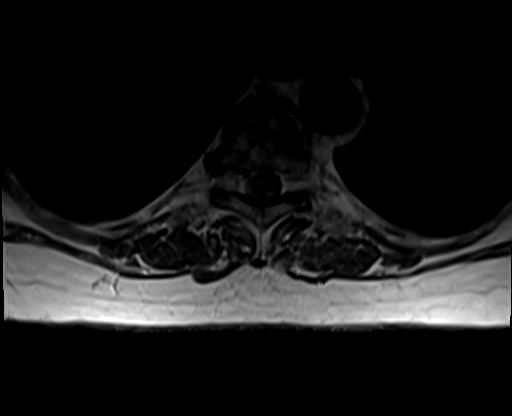
[im 17/43]
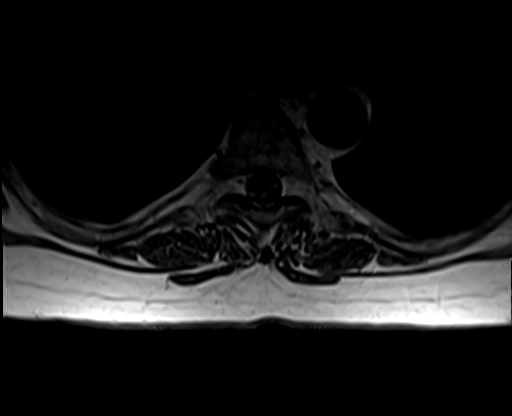
[im 26/43]
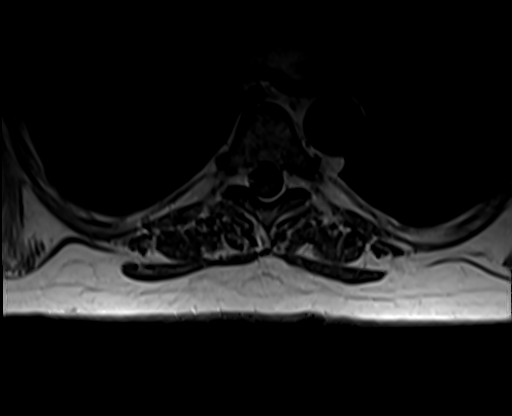
[im 30/43]
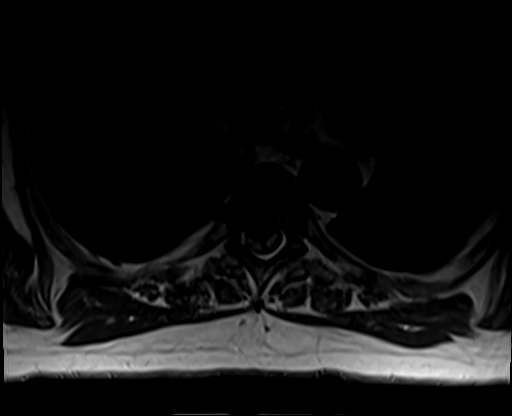
[im 34/43]
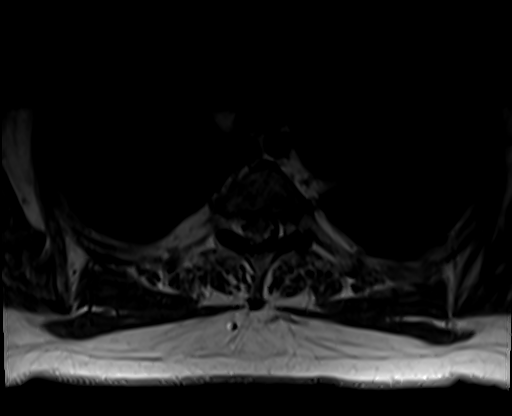
[im 43/43]
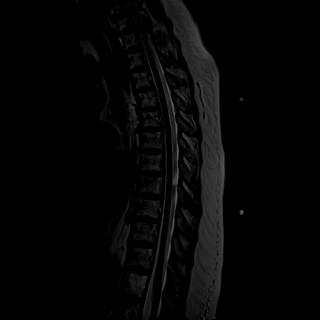

[Series 5009: T1 · axial · 5.0mm · 0.43mm/px · z∈[-232,+65]mm · 8 of 43 slices shown (4 of 4)]
[im 1/43]
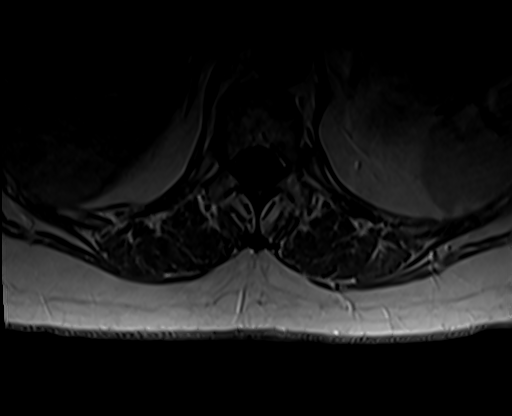
[im 9/43]
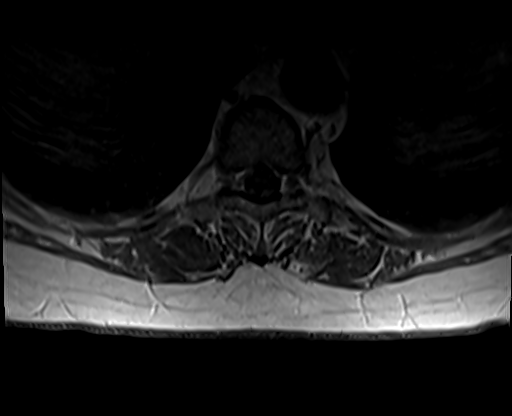
[im 13/43]
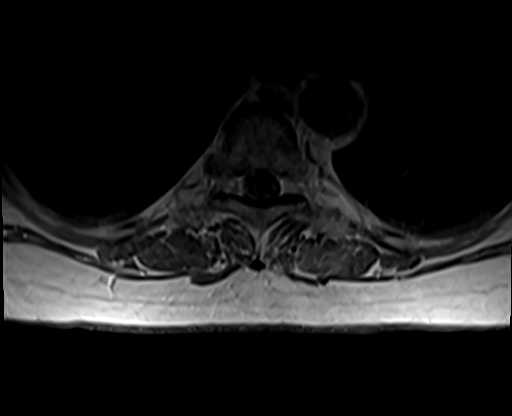
[im 17/43]
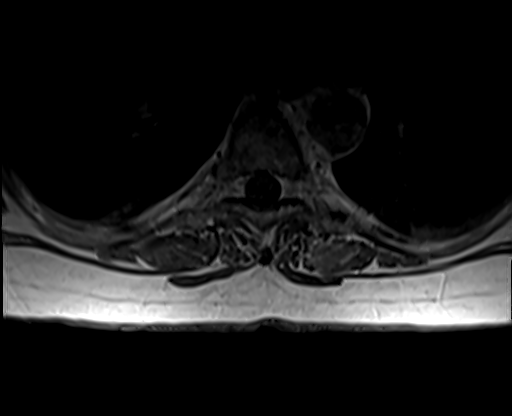
[im 26/43]
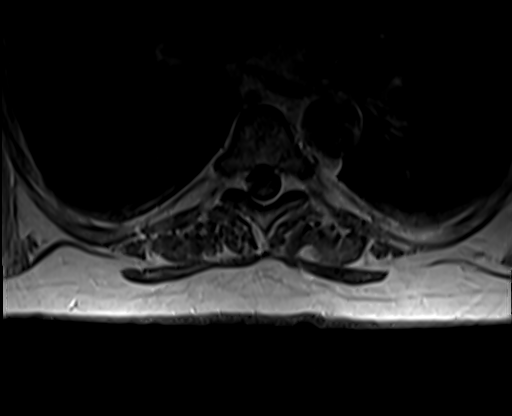
[im 30/43]
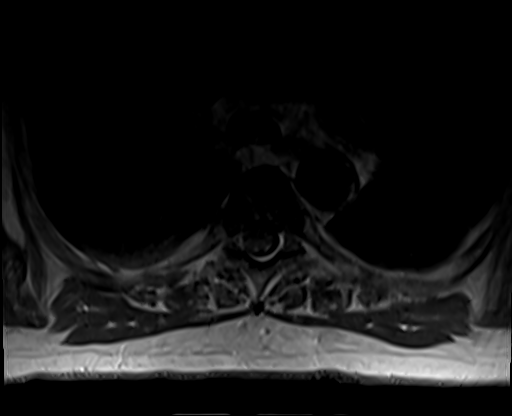
[im 34/43]
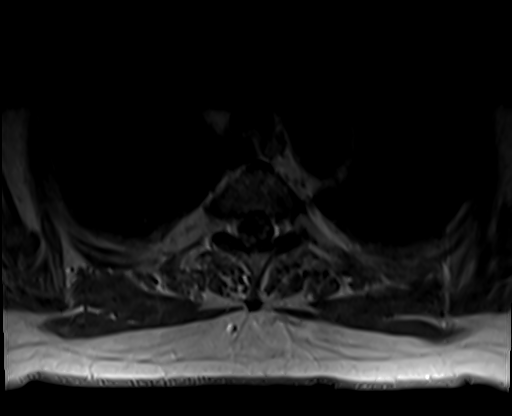
[im 43/43]
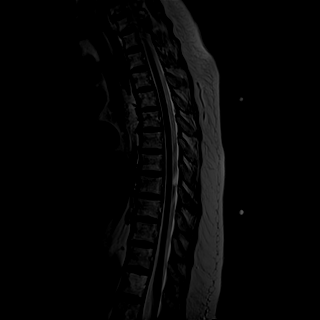

[39 of 48 positions shown; findings below may reference images not displayed]

PROCEDURE:     MR  - MR THORACIC SPINE WO/W  - September 30, 2010  [DATE]

RESULT:     Pre- and postgadolinium MR imaging of the thoracic spine is
performed emergently. There is patient motion artifact present which
degrades the image quality. The disc spaces and vertebral body heights are
maintained. There is no evidence of marrow edema. No spinal canal or
foraminal stenosis is appreciated. There is no evidence of abnormal
enhancement following intravenous administration of gadolinium. A paraspinal
mass is not appreciated. No abnormal fluid collection is evident.
IMPRESSION: No acute abnormality of the thoracic spine.

## 2011-01-26 IMAGING — MR MRI LUMBAR SPINE WITHOUT AND WITH CONTRAST
5 of 7 series · 32 of 48 positions shown · non-contrast
Comparison: none

REASON FOR EXAM: severe pain weakness leg parasthesia intermittent
COMMENTS:

[Series 2: T2 · sagittal · 4.0mm · 0.94mm/px · 3 of 16 slices shown (1 of 2)]
[im 1/16]
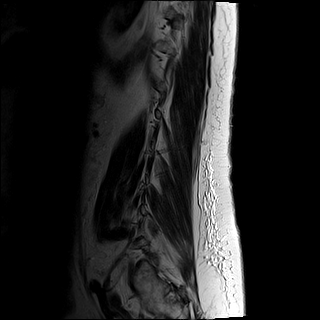
[im 8/16]
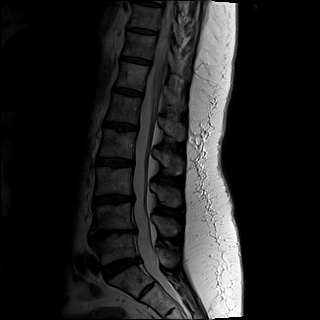
[im 16/16]
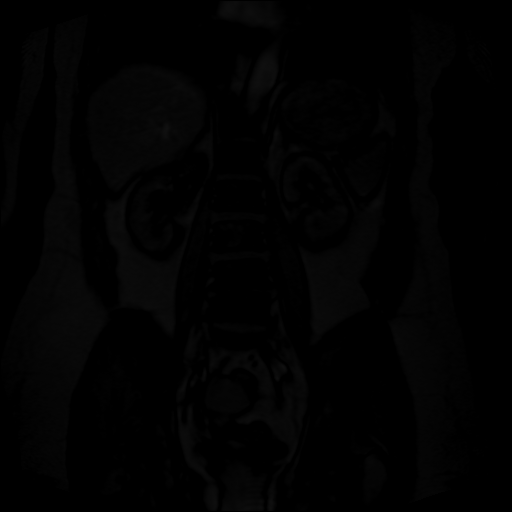

[Series 3: T1 · sagittal · 4.0mm · 0.94mm/px · 4 of 16 slices shown (1 of 3)]
[im 1/16]
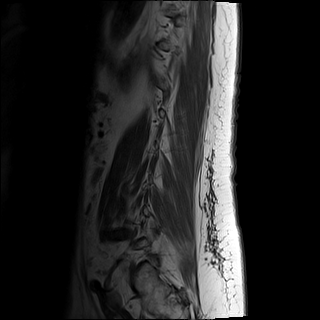
[im 6/16]
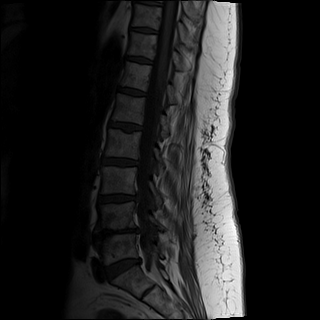
[im 11/16]
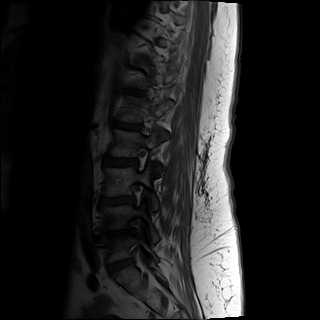
[im 16/16]
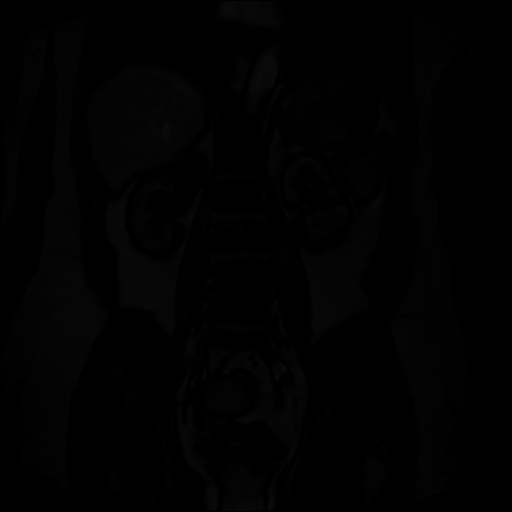

[Series 5: T2 · axial · 4.0mm · 0.78mm/px · z∈[-89,+188]mm · 11 of 49 slices shown (2 of 2)]
[im 1/49]
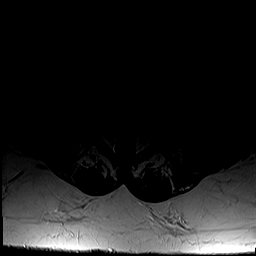
[im 5/49]
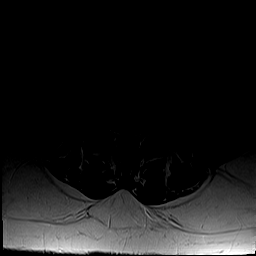
[im 10/49]
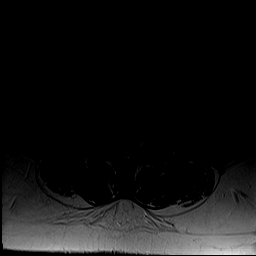
[im 15/49]
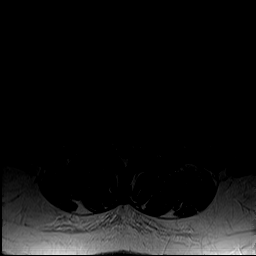
[im 20/49]
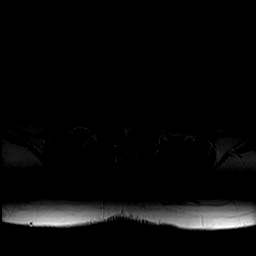
[im 25/49]
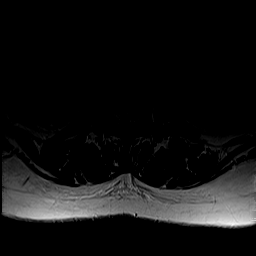
[im 29/49]
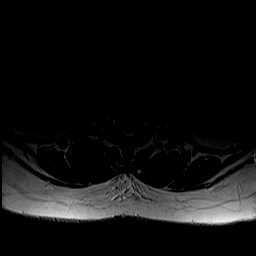
[im 34/49]
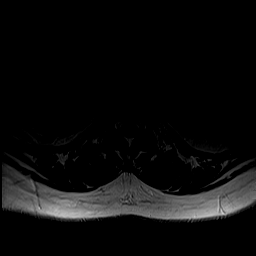
[im 39/49]
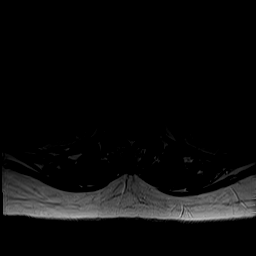
[im 44/49]
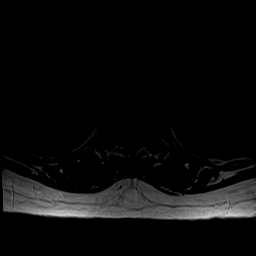
[im 49/49]
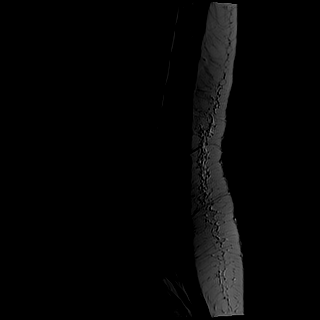

[Series 5005: T1 · axial · 4.0mm · 0.39mm/px · z∈[-89,+188]mm · 11 of 49 slices shown (2 of 3)]
[im 1/49]
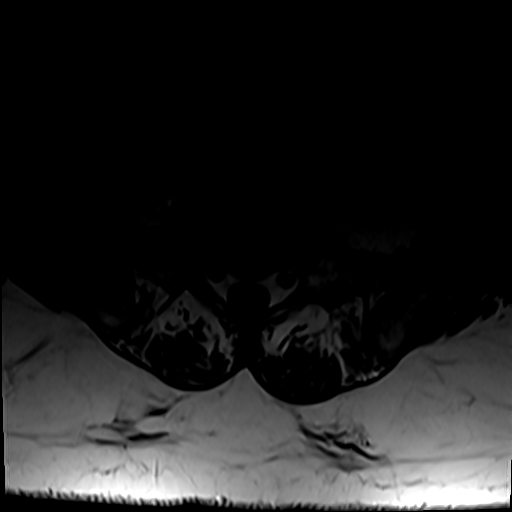
[im 5/49]
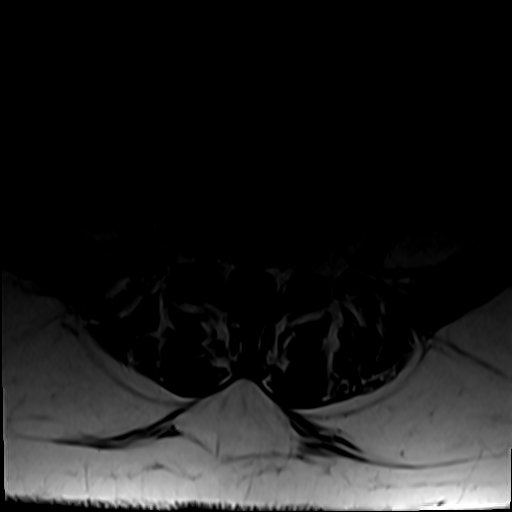
[im 10/49]
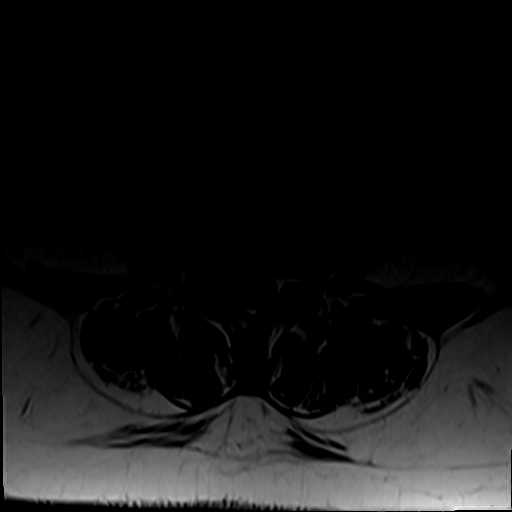
[im 15/49]
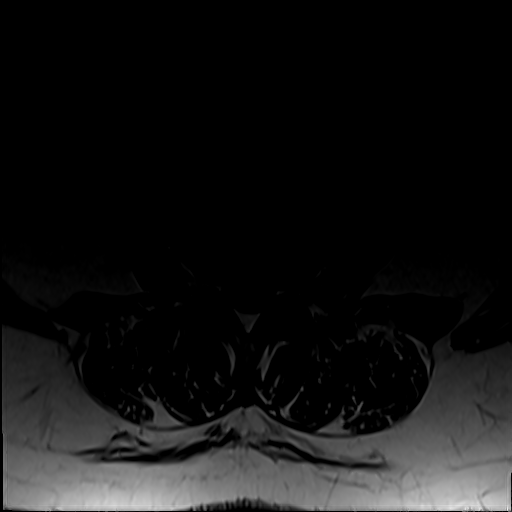
[im 20/49]
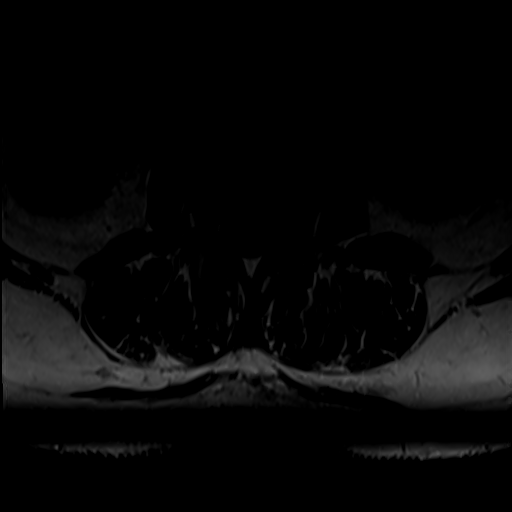
[im 25/49]
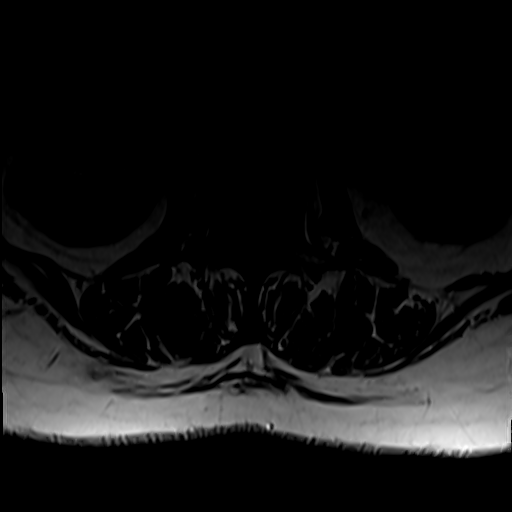
[im 29/49]
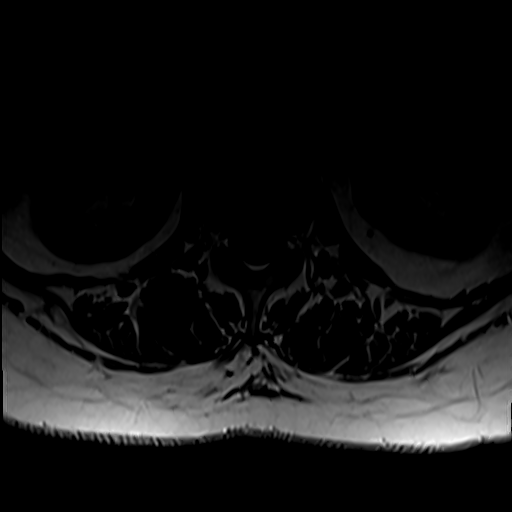
[im 34/49]
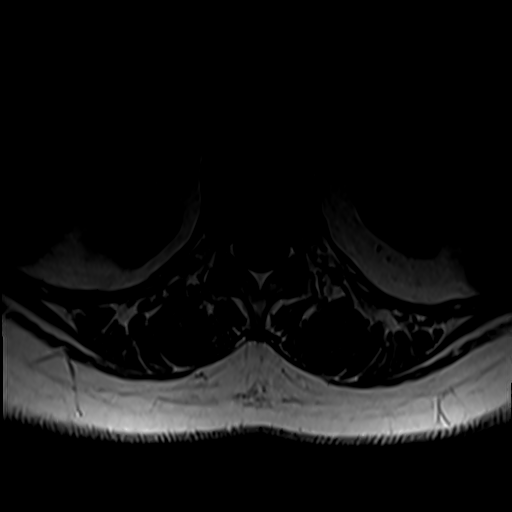
[im 39/49]
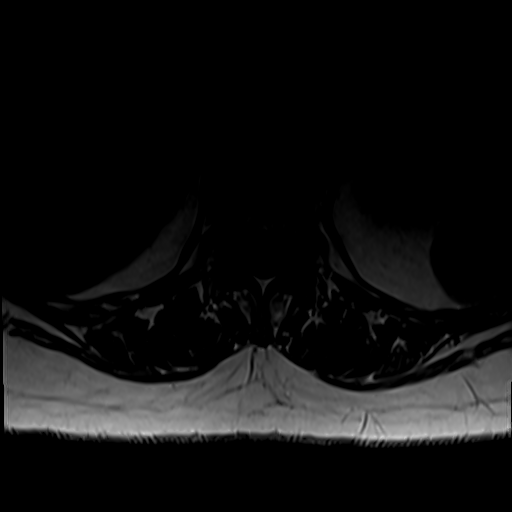
[im 44/49]
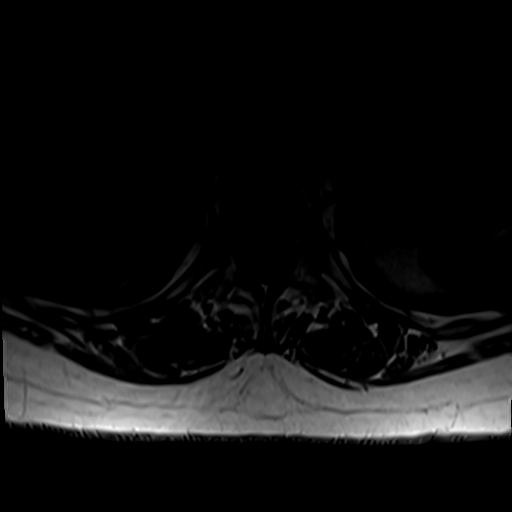
[im 49/49]
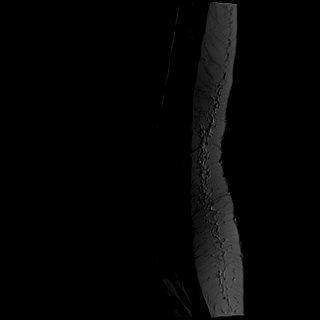

[Series 5007: T1 · sagittal · 4.0mm · 0.94mm/px · 3 of 16 slices shown (3 of 3)]
[im 1/16]
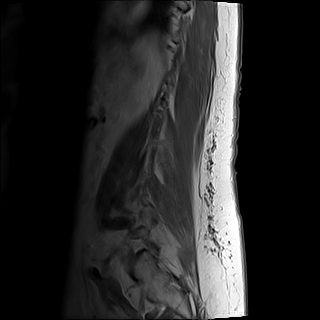
[im 6/16]
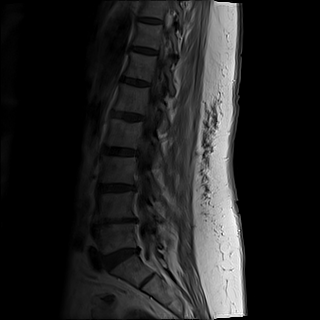
[im 11/16]
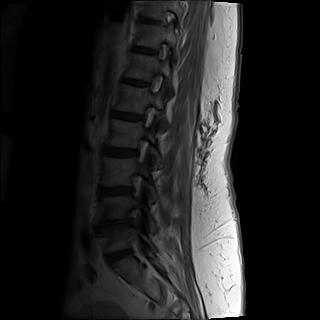

[32 of 48 positions shown; findings below may reference images not displayed]

PROCEDURE:     MR  - MR LUMBAR SPINE WO/W  - September 30, 2010  [DATE]

RESULT:     Pre- and postgadolinium MR imaging of the lumbar spine is
performed emergently. No previous study is available for comparison.

There is diffuse disc bulge at the L3-L4, L4-L5 and L5-S1 levels. There is
mild foraminal narrowing from the diffuse disc bulge bilaterally at the
L5-S1 level slightly more prominent on the left with mild left-sided L4-L5
and foraminal narrowing and moderate right-sided L4-L5 foraminal narrowing.
Correlate clinically for significance of these findings. Mild foraminal
narrowing is noted on the left at L3-L4. No abnormal enhancement is
demonstrated on the post gadolinium MR images in the T1 axial or sagittal
sequences. The conus medullaris terminates at the L1 vertebral level. No
significant spinal canal stenosis is appreciated although the diffuse
annular disc bulge to cause some flattening of the anterior margins of the
thecal sac most prominently at the L3-L4 and L4-L5 levels.
IMPRESSION: 1. No acute lumbar spine abnormality. Degenerative disc is changes are noted
at L3-L4, L4-L5 and L5-S1 with some mild spinal canal and mild to moderate
foraminal narrowing as discussed above. No abnormal enhancement evident.

## 2011-02-06 ENCOUNTER — Other Ambulatory Visit: Payer: Self-pay | Admitting: Family Medicine

## 2011-04-14 IMAGING — CT CT HEAD WITHOUT CONTRAST
2 series · 16 of 30 positions shown, 20 images · non-contrast
Comparison: none

REASON FOR EXAM: weakness dizziness
COMMENTS:

[Series 2: without · axial · non-contrast · 0.42mm/px · z∈[-52,+68]mm · 13 of 28 slices shown, 17 images]
[im 2/28  brain]
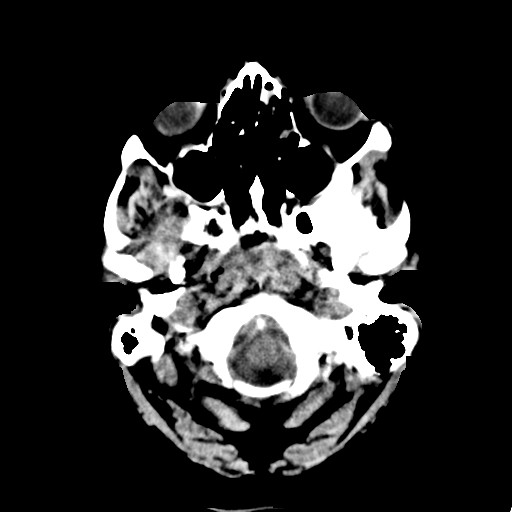
[im 2/28  bone]
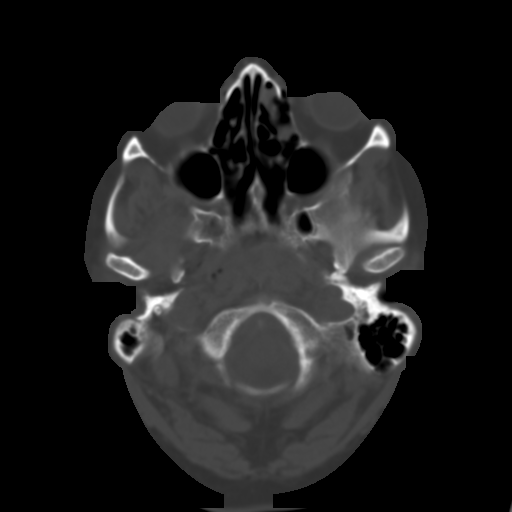
[im 4/28  brain]
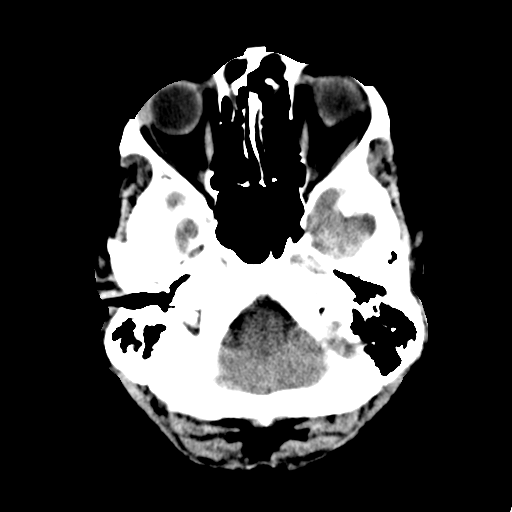
[im 6/28  brain]
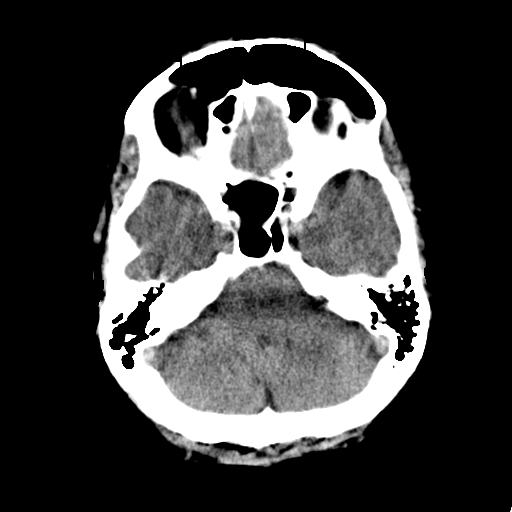
[im 8/28  brain]
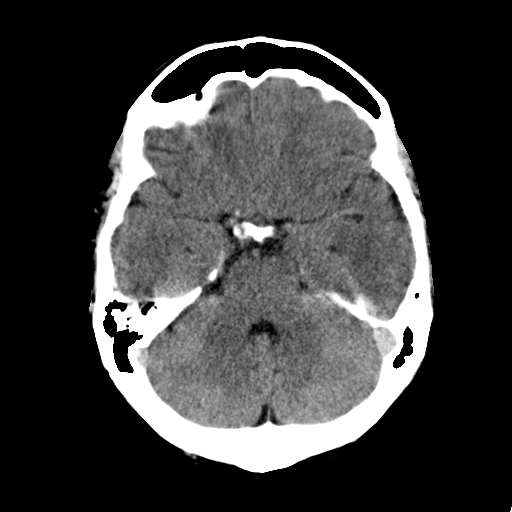
[im 10/28  brain]
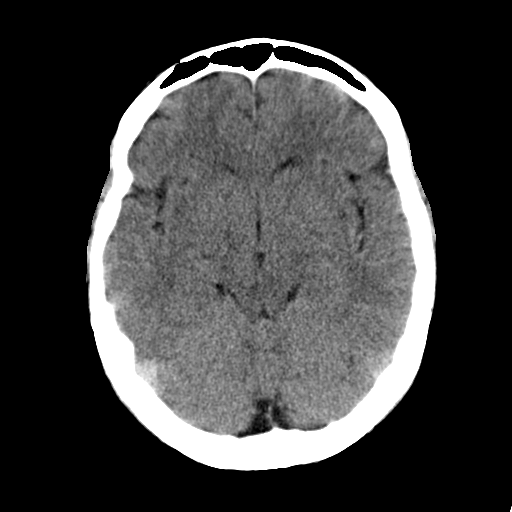
[im 10/28  bone]
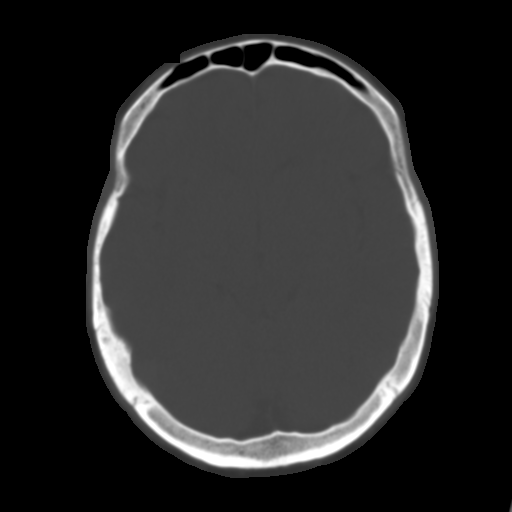
[im 12/28  brain]
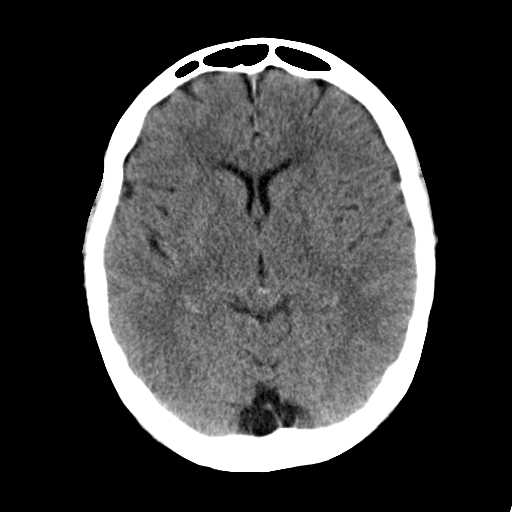
[im 14/28  brain]
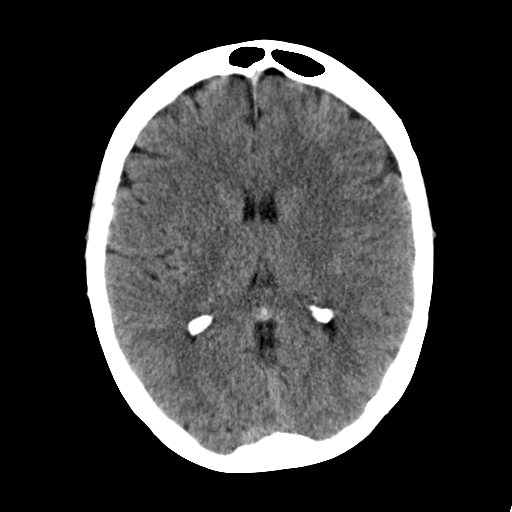
[im 16/28  brain]
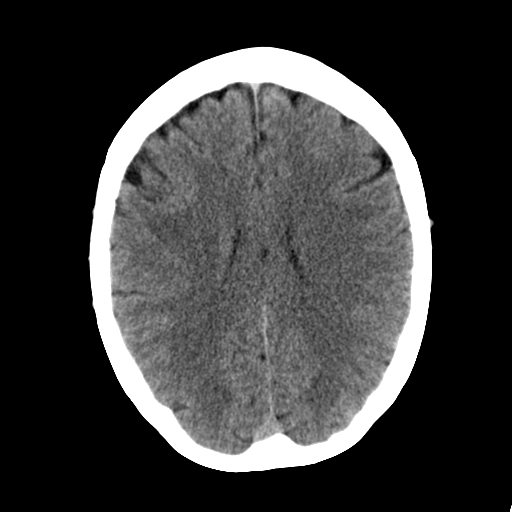
[im 18/28  brain]
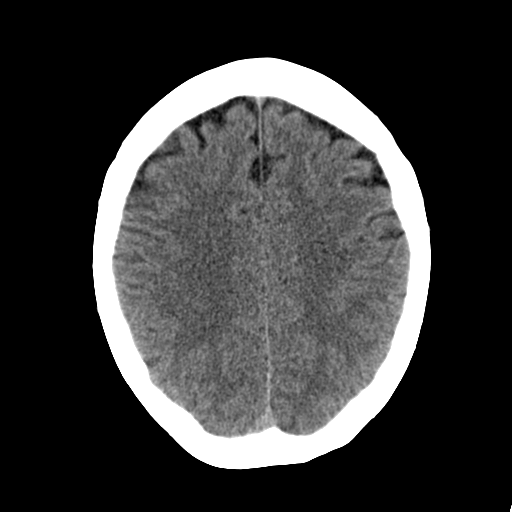
[im 18/28  bone]
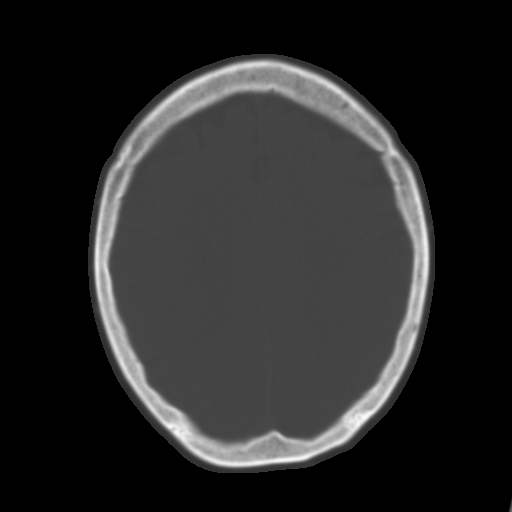
[im 20/28  brain]
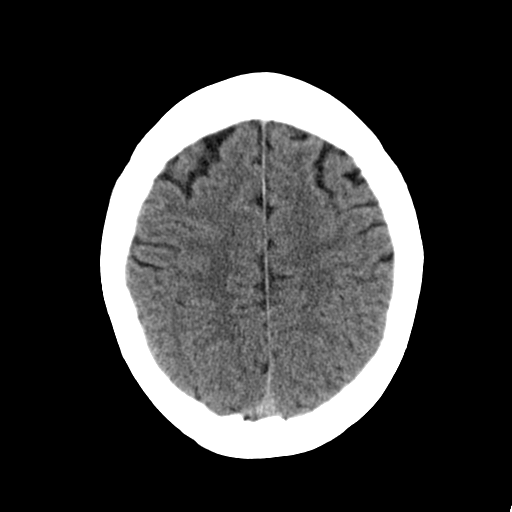
[im 22/28  brain]
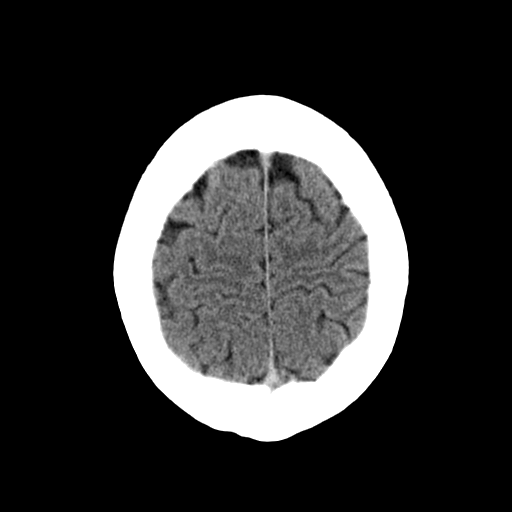
[im 24/28  brain]
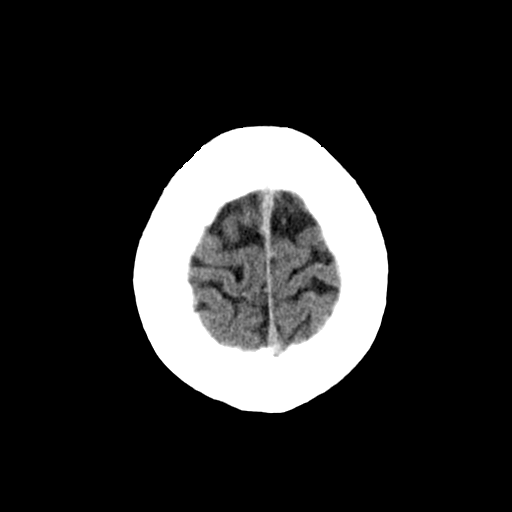
[im 26/28  brain]
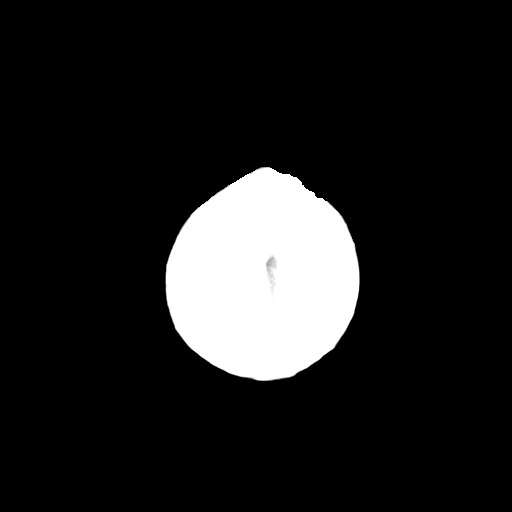
[im 26/28  bone]
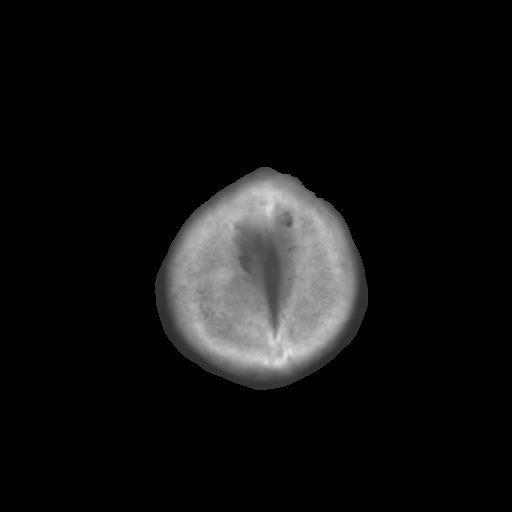

[Series 3: bone · axial · 0.42mm/px · z∈[-52,-12]mm · 3 of 28 slices shown]
[im 2/28  bone]
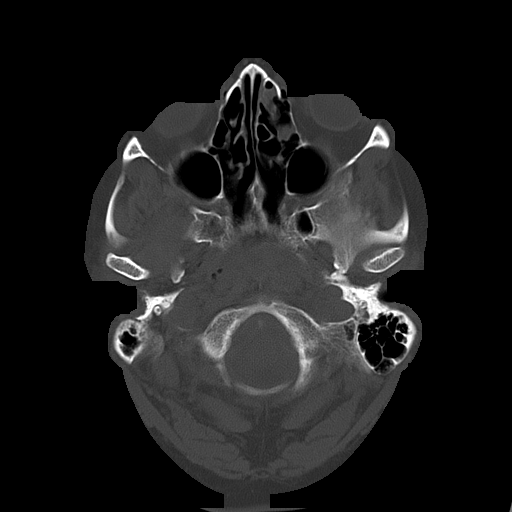
[im 6/28  bone]
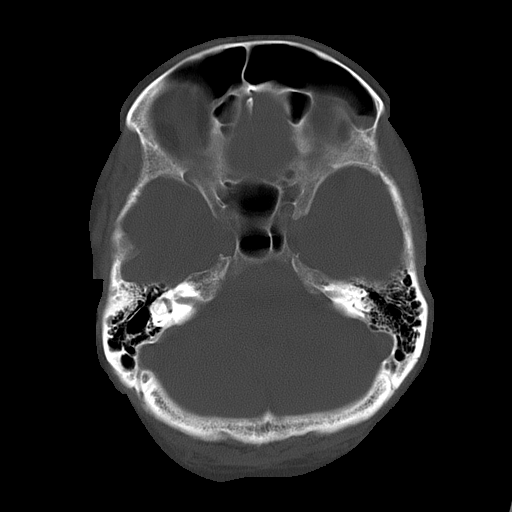
[im 10/28  bone]
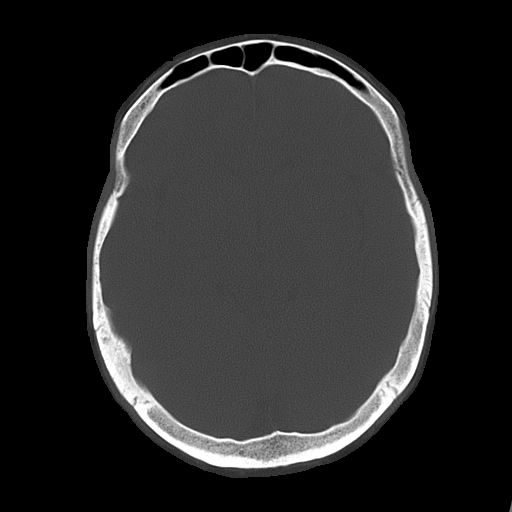

[16 of 30 positions shown; findings below may reference images not displayed]

PROCEDURE:     CT  - CT HEAD WITHOUT CONTRAST  - September 30, 2010  [DATE]

RESULT:     Axial noncontrast CT scanning was performed through the brain at
5 mm intervals and slice thicknesses.

The ventricles are normal in size and position. There is no intracranial
hemorrhage nor intracranial mass effect. The cerebellum and brainstem are
normal in appearance. There is no evidence of an evolving ischemic
infarction. At bone window settings the observed portions of the paranasal
sinuses and mastoid air cells are clear. There is no evidence of an acute
skull fracture.
IMPRESSION: I see no acute intracranial abnormality.

## 2011-05-03 ENCOUNTER — Other Ambulatory Visit: Payer: Self-pay | Admitting: Family Medicine

## 2011-05-31 LAB — HM HEPATITIS C SCREENING LAB: HM Hepatitis Screen: NEGATIVE

## 2011-07-18 ENCOUNTER — Emergency Department: Payer: Self-pay | Admitting: *Deleted

## 2011-08-01 ENCOUNTER — Ambulatory Visit: Payer: Self-pay | Admitting: Family Medicine

## 2011-10-16 ENCOUNTER — Inpatient Hospital Stay: Payer: Self-pay | Admitting: Internal Medicine

## 2011-10-23 ENCOUNTER — Emergency Department: Payer: Self-pay | Admitting: Emergency Medicine

## 2011-11-27 IMAGING — CR DG CHEST 2V
1 series · 2 of 2 positions shown · non-contrast
Comparison: none

REASON FOR EXAM: dyspnea pneumonia
COMMENTS:

[Series 1: view not recorded · 0.17mm/px · 2 of 2 slices shown]
[im 1/2]
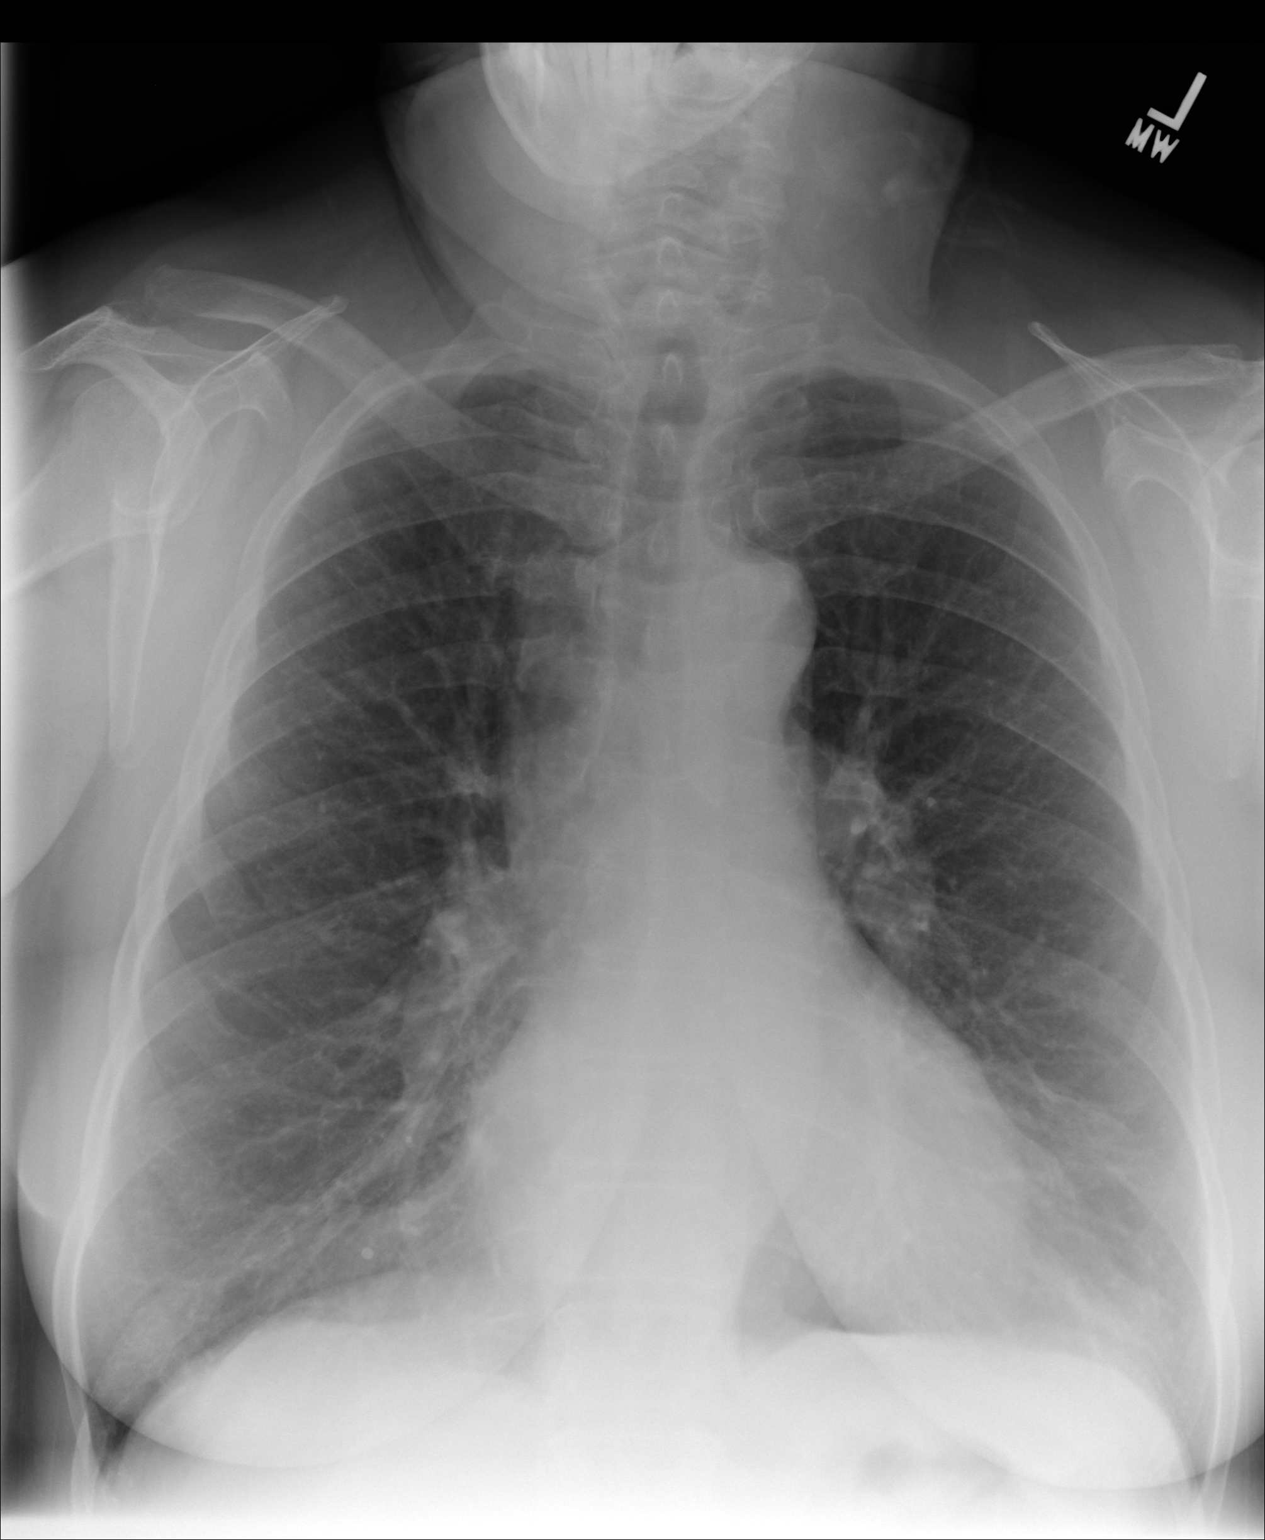
[im 2/2]
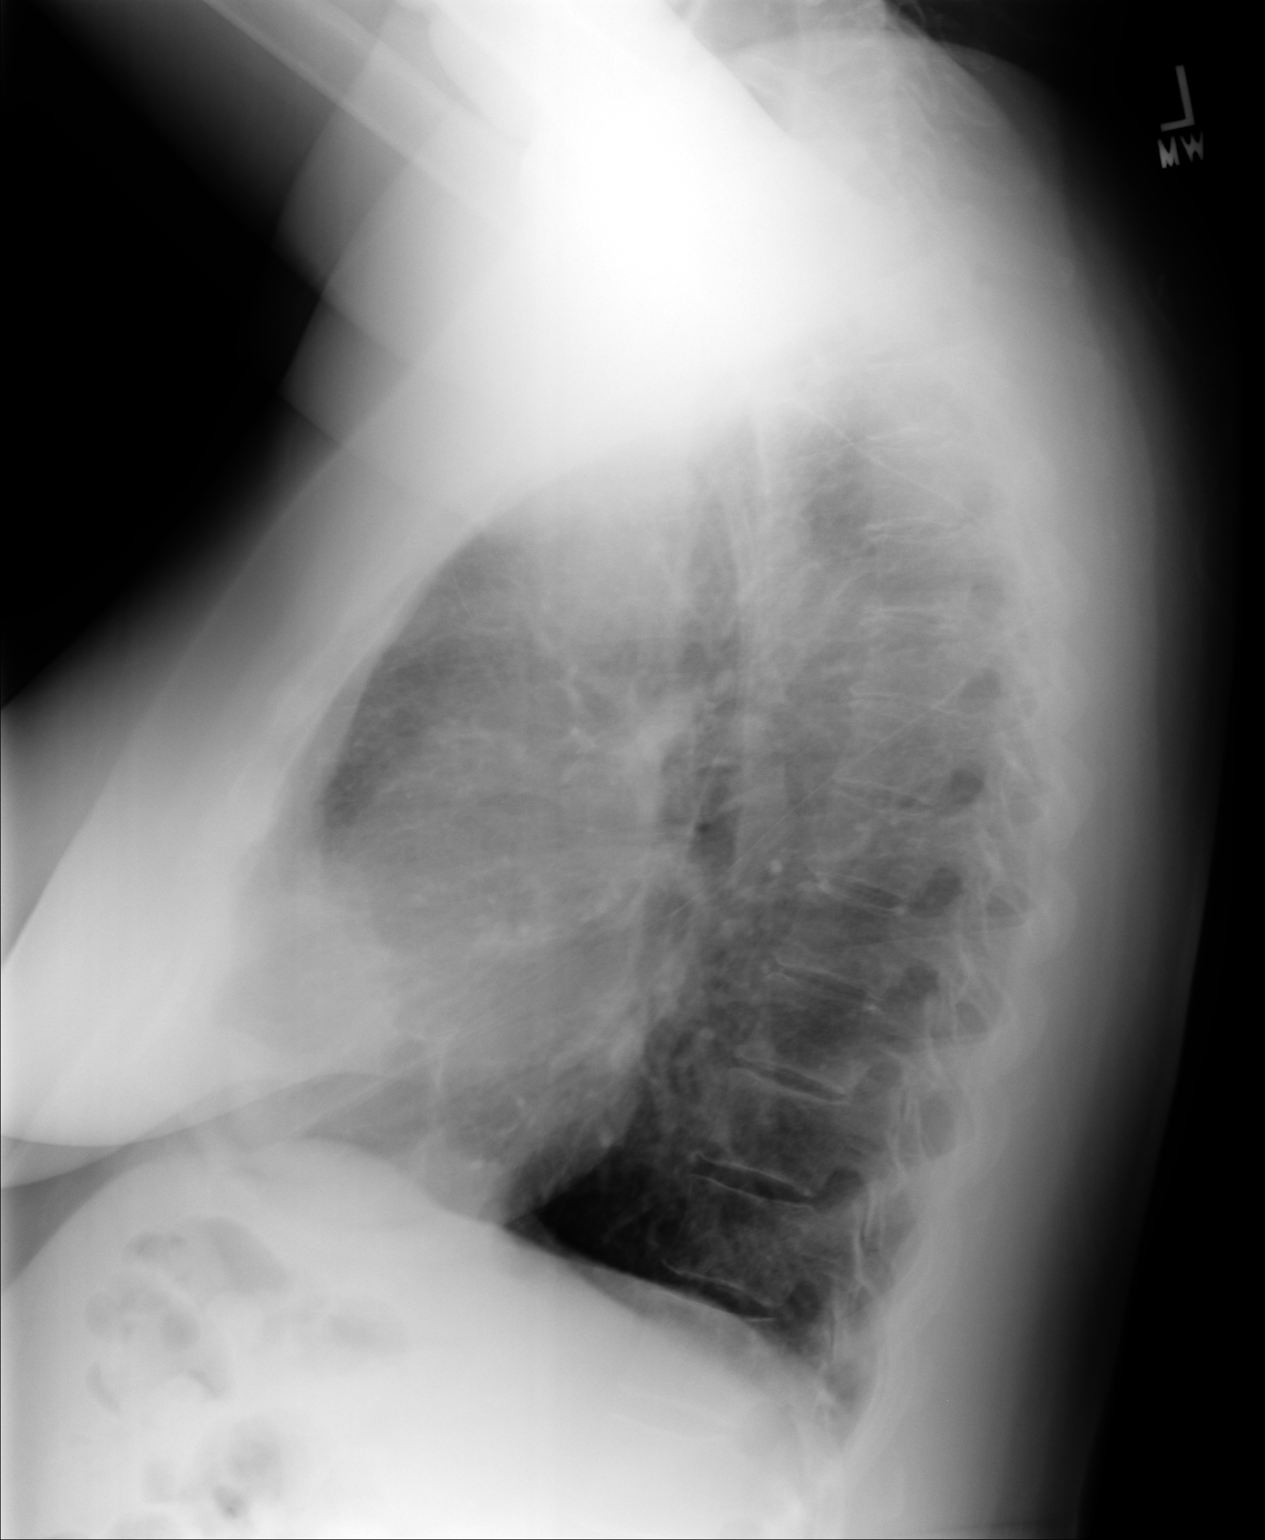

[2 of 2 positions shown; findings below may reference images not displayed]

PROCEDURE:     KDR - KDXR CHEST PA (OR AP) AND LAT  - August 01, 2011 [DATE]

RESULT:     Comparison is made to the prior exam of 07/18/2011. No pneumonia
is identified. The heart and pulmonary vascularity are slightly more
prominent than on the prior exam. Early changes of CHF superimposed on COPD
cannot be ruled out and are mentioned for correlation with clinical
findings. No pleural effusion or pulmonary edema is identified. No acute
bony abnormalities are seen. The chest appears bilaterally hyperinflated
suspicious for a history of COPD.
IMPRESSION: 1. The lung fields are clear of infiltrate.
2. The heart and pulmonary vascularity are slightly more prominent than on
the prior exam. The possibility of early CHF superimposed on COPD cannot be
excluded and is mentioned for correlation with clinical findings.

## 2012-01-04 ENCOUNTER — Emergency Department: Payer: Self-pay | Admitting: Unknown Physician Specialty

## 2012-01-04 LAB — COMPREHENSIVE METABOLIC PANEL
Albumin: 3.6 g/dL (ref 3.4–5.0)
Alkaline Phosphatase: 89 U/L (ref 50–136)
Anion Gap: 3 — ABNORMAL LOW (ref 7–16)
BUN: 20 mg/dL — ABNORMAL HIGH (ref 7–18)
Bilirubin,Total: 0.3 mg/dL (ref 0.2–1.0)
Calcium, Total: 8.9 mg/dL (ref 8.5–10.1)
Chloride: 110 mmol/L — ABNORMAL HIGH (ref 98–107)
Co2: 29 mmol/L (ref 21–32)
Creatinine: 0.76 mg/dL (ref 0.60–1.30)
EGFR (African American): >60
EGFR (Non-African Amer.): >60
Glucose: 99 mg/dL (ref 65–99)
Osmolality: 286 (ref 275–301)
Potassium: 4.1 mmol/L (ref 3.5–5.1)
SGOT(AST): 21 U/L (ref 15–37)
SGPT (ALT): 21 U/L
Sodium: 142 mmol/L (ref 136–145)
Total Protein: 7.3 g/dL (ref 6.4–8.2)

## 2012-01-04 LAB — URINALYSIS, COMPLETE
Bacteria: NONE SEEN
Bilirubin,UR: NEGATIVE
Glucose,UR: NEGATIVE mg/dL (ref 0–75)
Ketone: NEGATIVE
Leukocyte Esterase: NEGATIVE
Nitrite: NEGATIVE
Ph: 5 (ref 4.5–8.0)
Protein: NEGATIVE
RBC,UR: 1 /HPF (ref 0–5)
Specific Gravity: 1.023 (ref 1.003–1.030)
Squamous Epithelial: 3
WBC UR: 3 /HPF (ref 0–5)

## 2012-01-04 LAB — CBC
HCT: 44.8 % (ref 35.0–47.0)
HGB: 14.9 g/dL (ref 12.0–16.0)
MCH: 32.6 pg (ref 26.0–34.0)
MCHC: 33.3 g/dL (ref 32.0–36.0)
MCV: 98 fL (ref 80–100)
Platelet: 192 10*3/uL (ref 150–440)
RBC: 4.58 10*6/uL (ref 3.80–5.20)
RDW: 14.4 % (ref 11.5–14.5)
WBC: 9.2 10*3/uL (ref 3.6–11.0)

## 2012-01-04 LAB — TROPONIN I: Troponin-I: <0.02 ng/mL

## 2012-01-30 IMAGING — CR DG CHEST 2V
1 series · 2 of 2 positions shown · non-contrast
Comparison: none

REASON FOR EXAM: wheezing
COMMENTS:   LMP: Post Hysterectomy

[Series 1: view not recorded · 0.17mm/px · 2 of 2 slices shown]
[im 1/2]
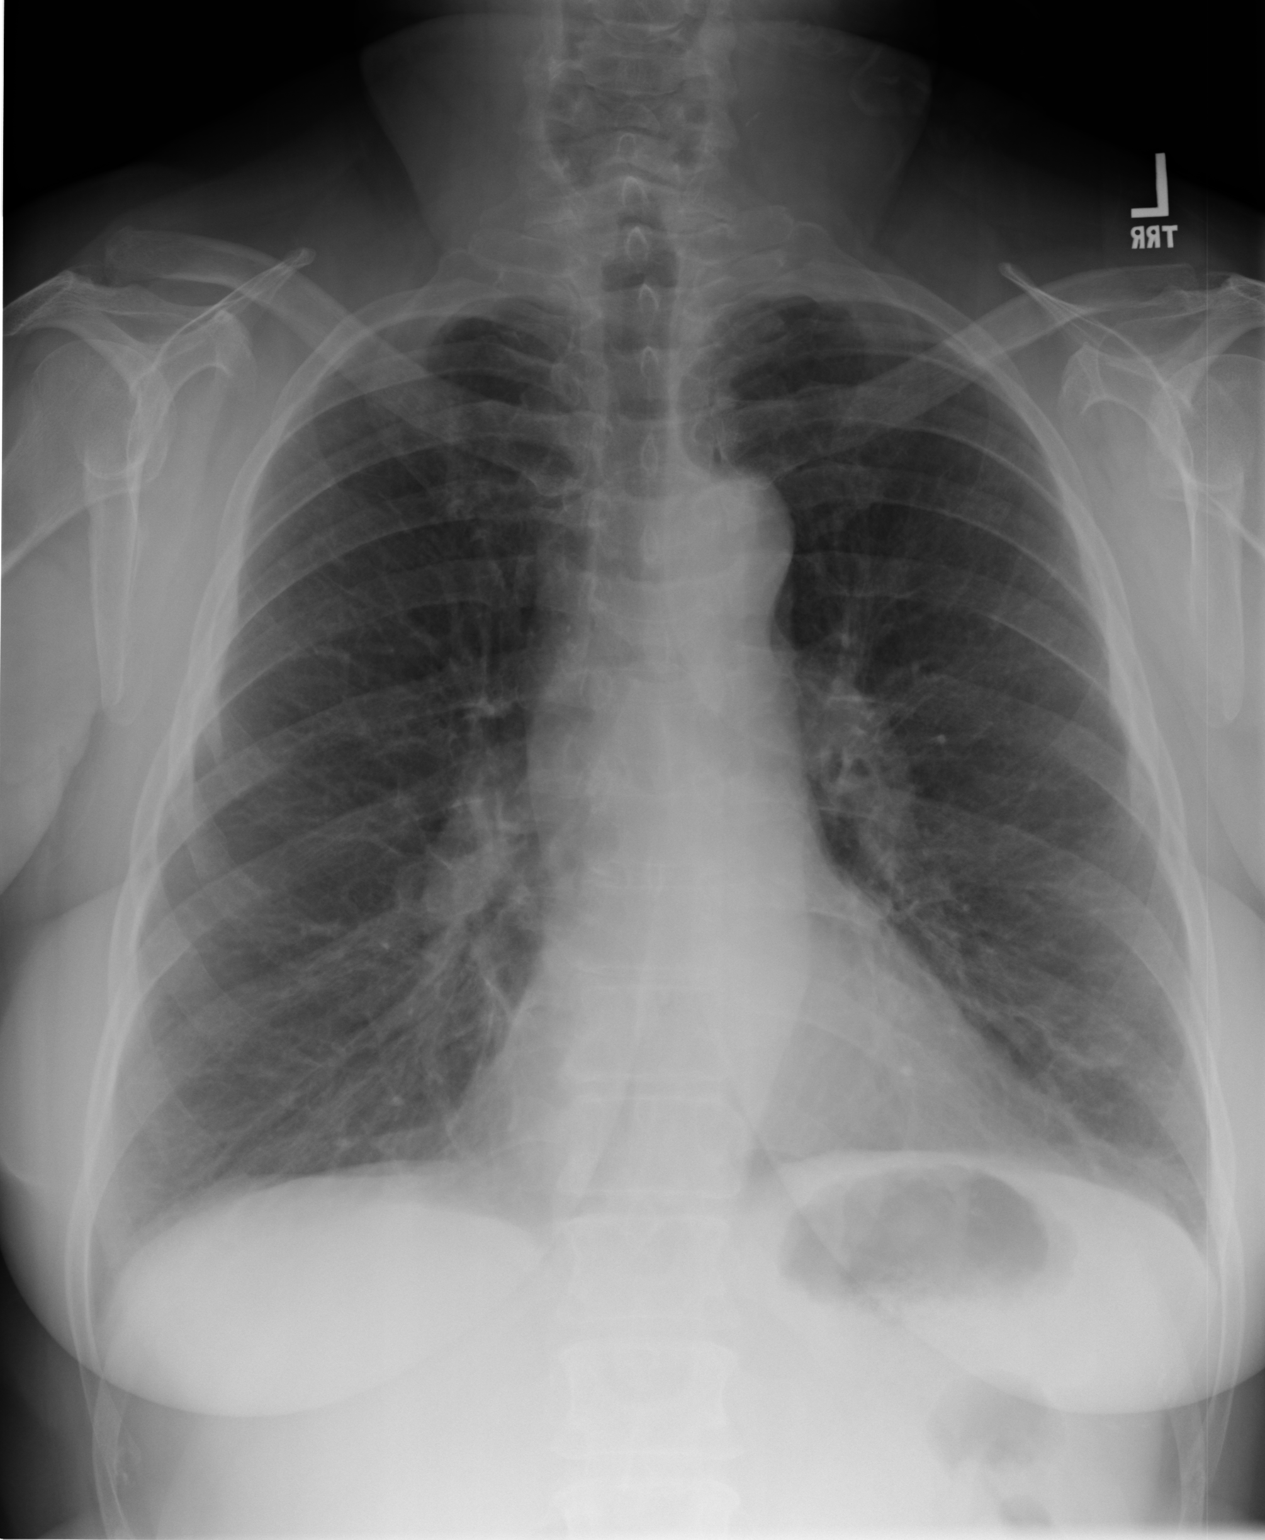
[im 2/2]
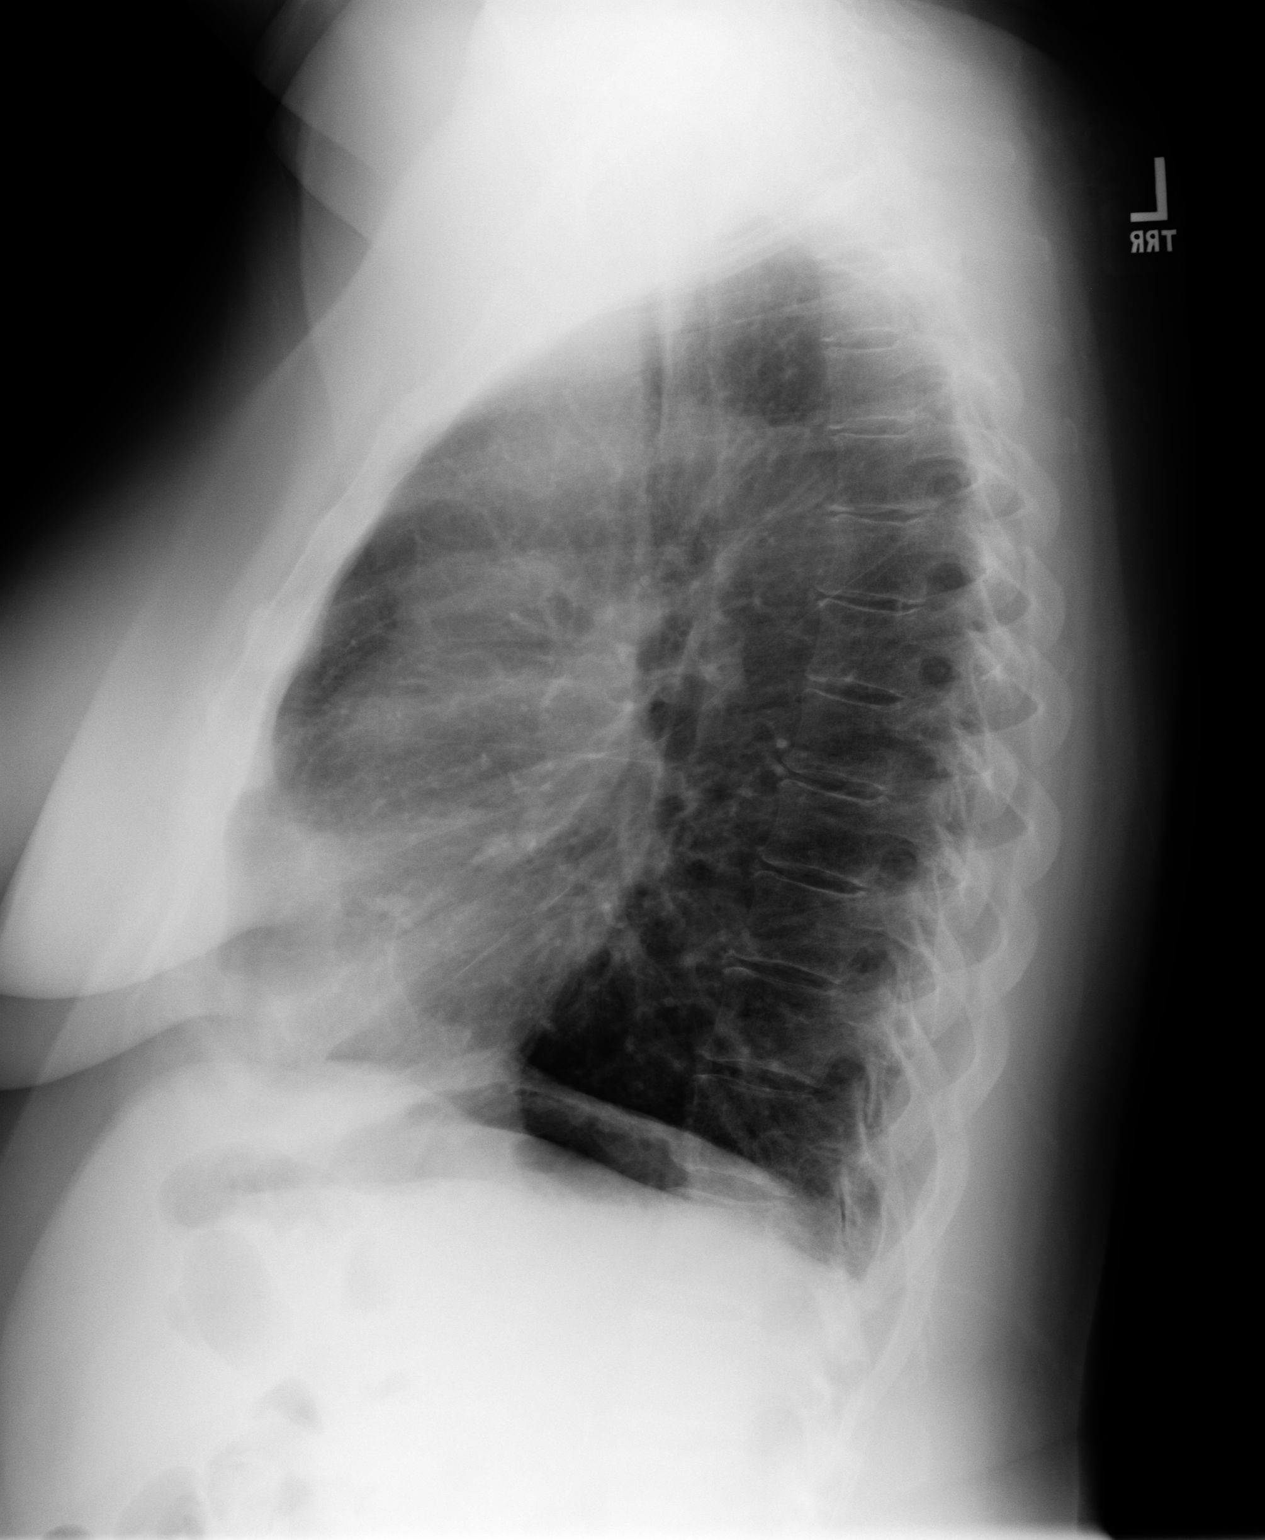

[2 of 2 positions shown; findings below may reference images not displayed]

PROCEDURE:     DXR - DXR CHEST PA (OR AP) AND LATERAL  - July 18, 2011  [DATE]

RESULT:     Comparison is made to the prior exam of 09/30/2010. The lung
fields are clear. No pneumonia, pneumothorax or pleural effusion is seen.
Heart size is normal. The chest appears mild hyperinflated compatible with a
history of COPD or asthma. No significant osseous abnormalities are noted.
IMPRESSION: 1. The lung fields are clear.
2. Heart size is normal.
3. The chest appears mildly hyperinflated bilaterally.

## 2012-04-29 IMAGING — CR DG CHEST 2V
1 series · 3 of 3 positions shown · non-contrast
Comparison: none

REASON FOR EXAM: diff breathing
COMMENTS:

[Series 1: w chest pa · 0.14mm/px · 3 of 3 slices shown]
[im 1/3]
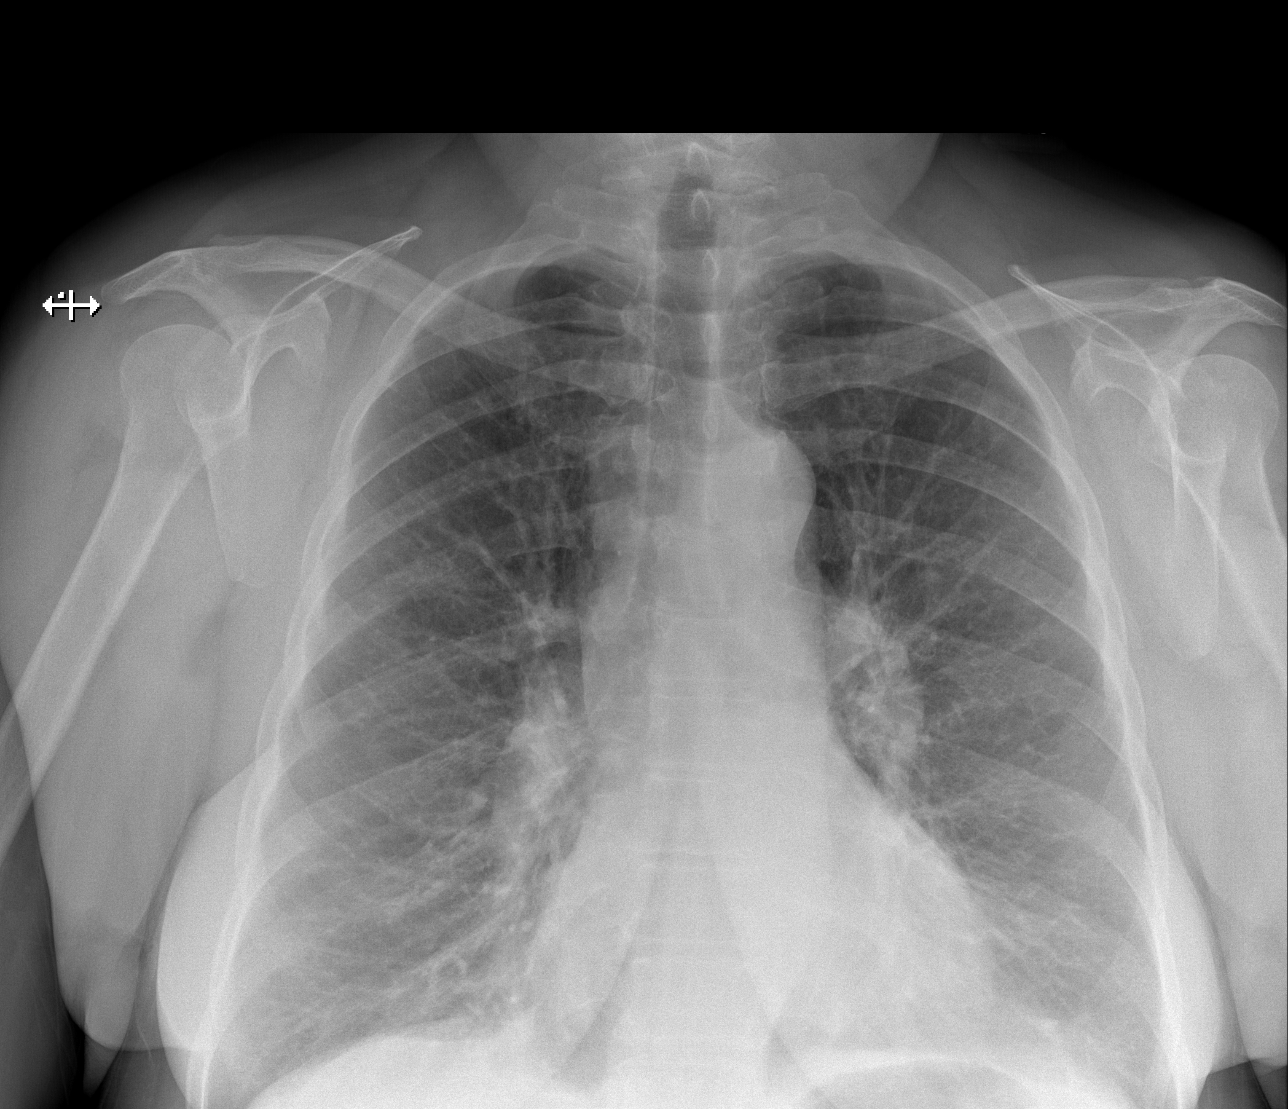
[im 2/3]
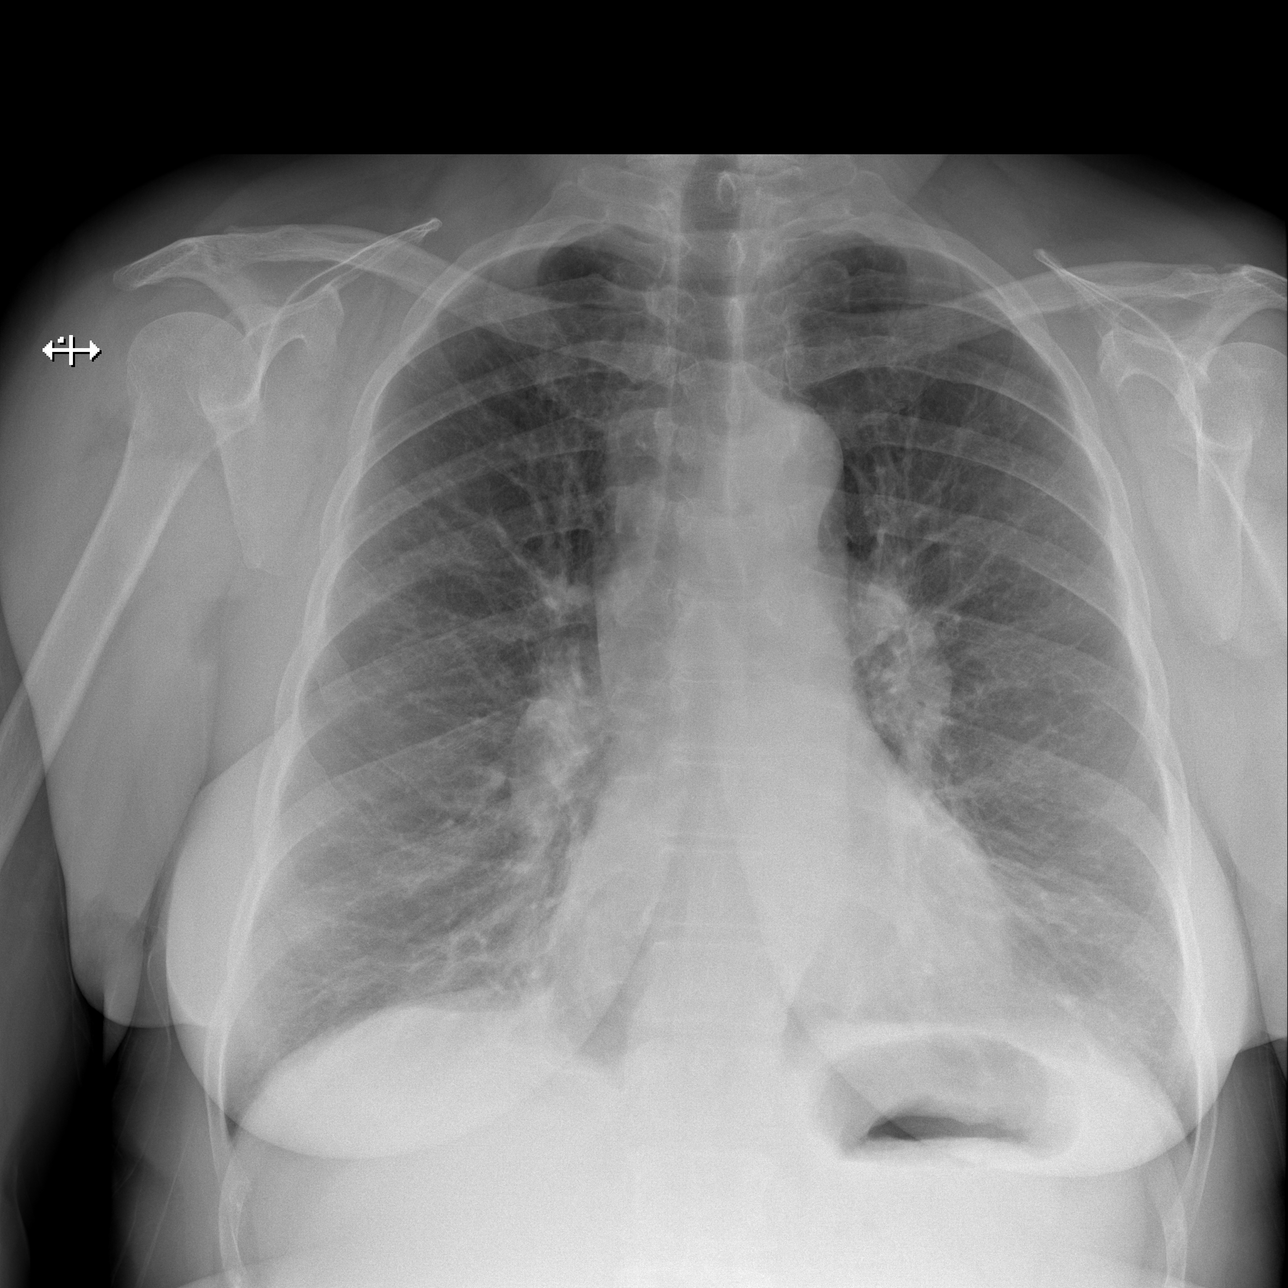
[im 3/3]
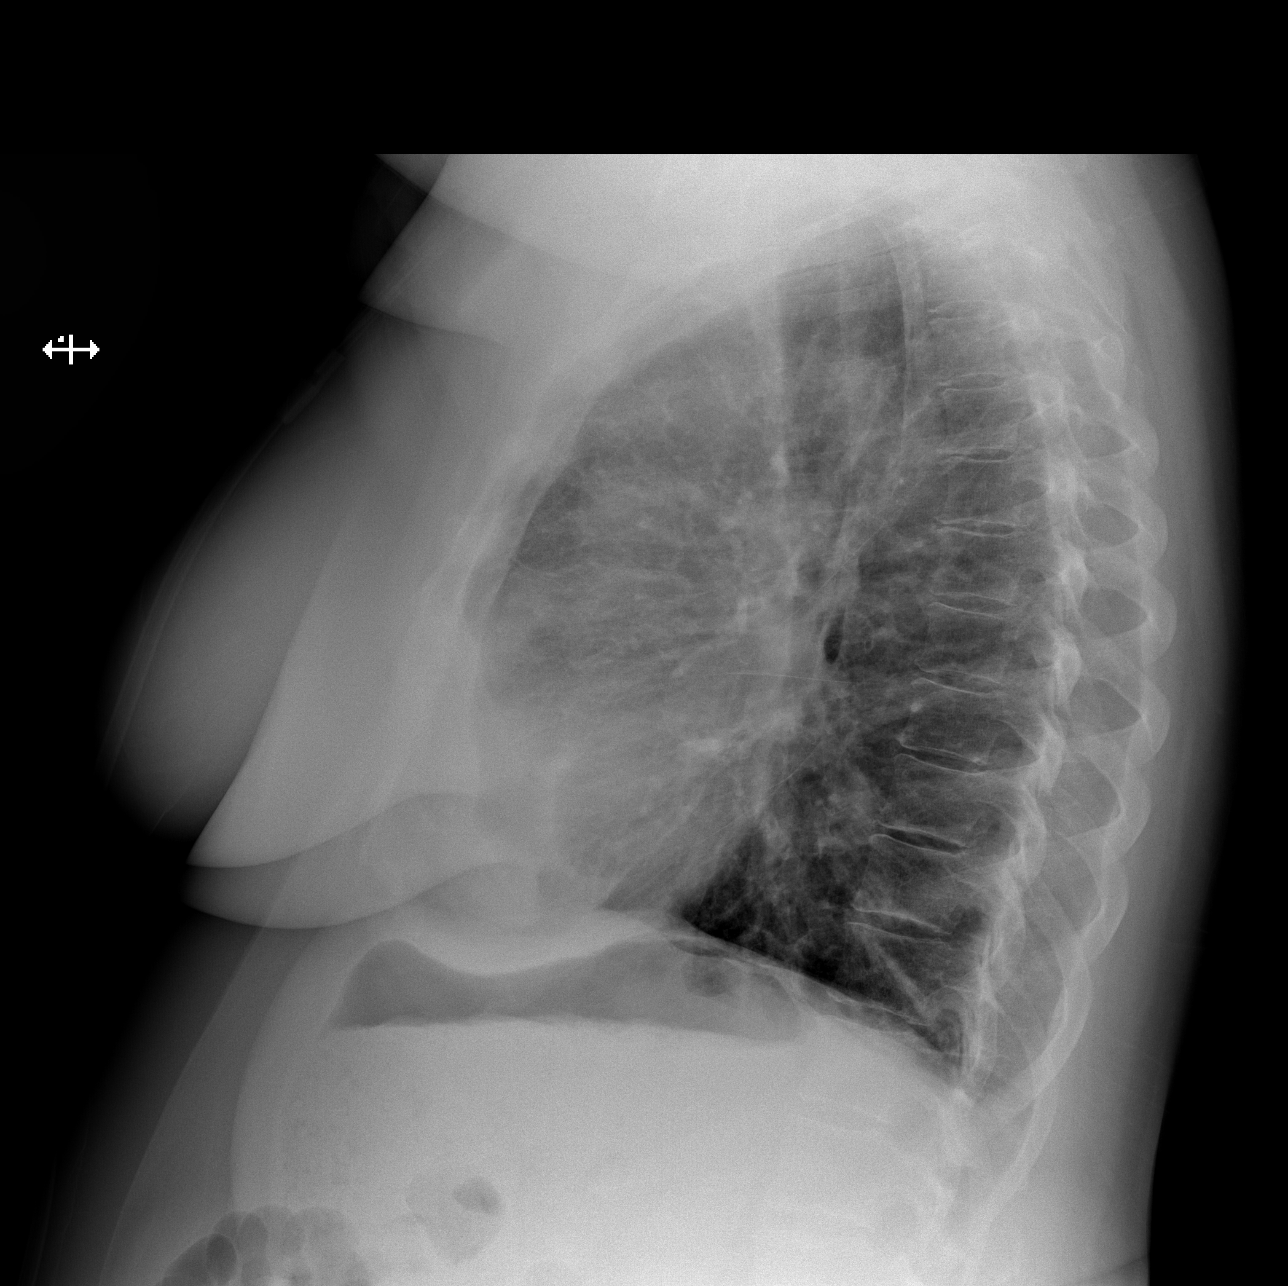

[3 of 3 positions shown; findings below may reference images not displayed]

PROCEDURE:     DXR - DXR CHEST PA (OR AP) AND LATERAL  - October 16, 2011  [DATE]

RESULT:     Comparison is made to the prior exam of 08/01/2011. No
pneumonia, pneumothorax or pleural effusion is seen. Heart size is normal.
No significant osseous abnormalities are identified. The lungs are
bilaterally hyperinflated compatible with the clinical history of COPD.
IMPRESSION: 1. COPD.
2. No pneumonia or other acute change is identified.

## 2012-05-06 IMAGING — CR DG LUMBAR SPINE 2-3V
1 series · 3 of 3 positions shown · non-contrast
Comparison: none

REASON FOR EXAM: FALL, LBP
COMMENTS:   May transport without cardiac monitor

[Series 4: t lumbar spine ap · 0.14mm/px · 3 of 3 slices shown]
[im 1/3]
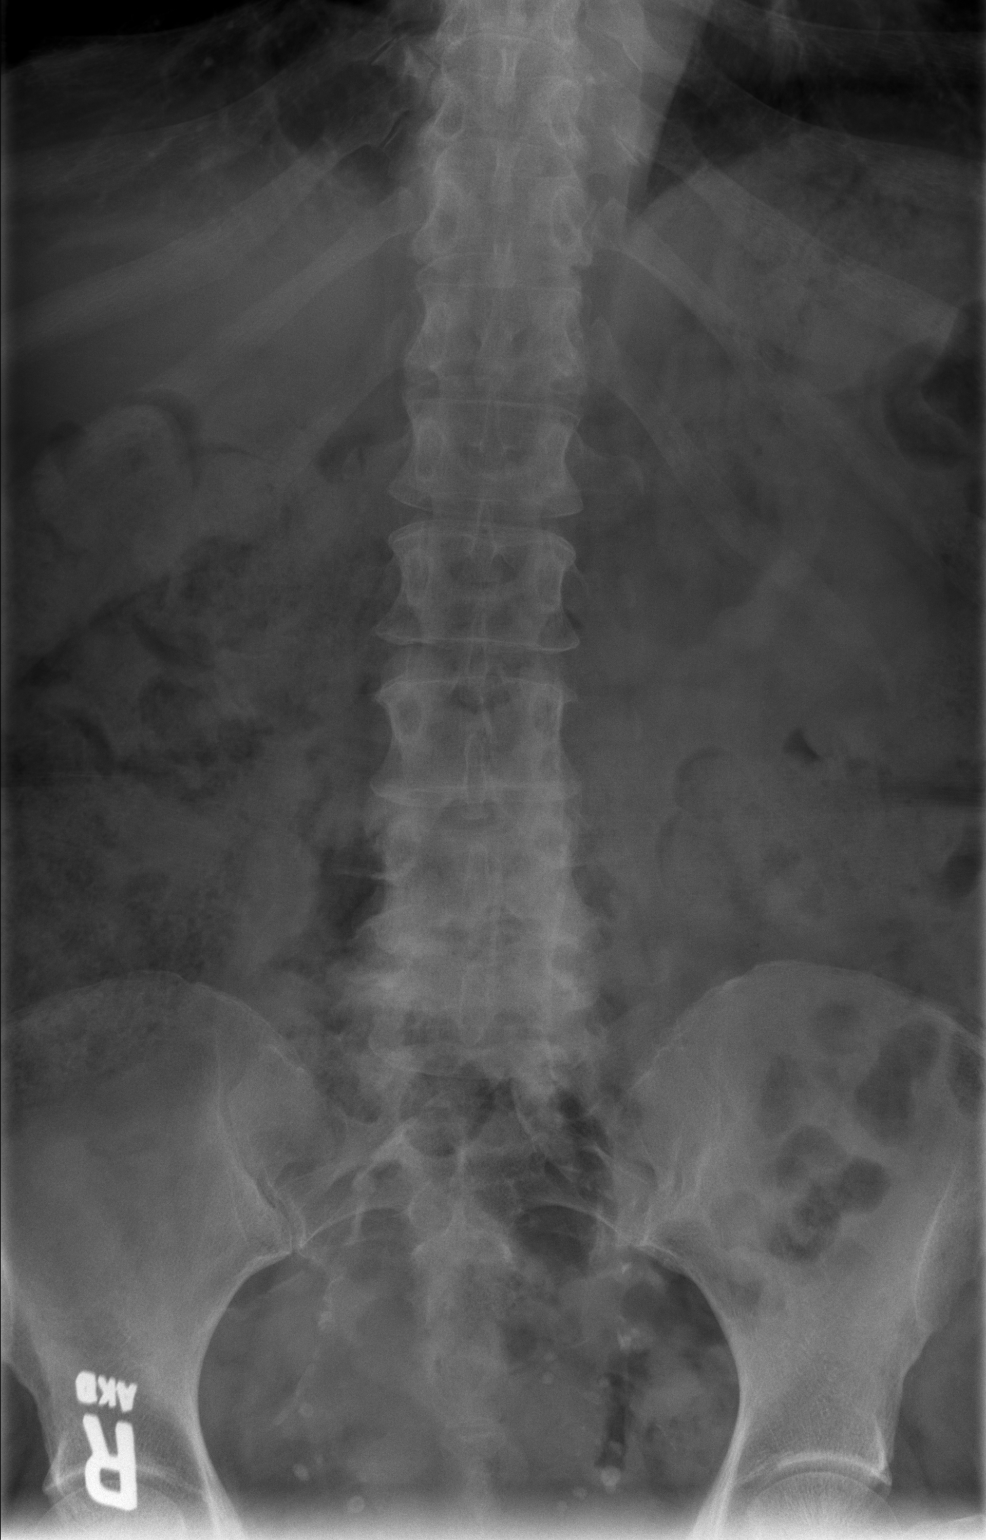
[im 2/3]
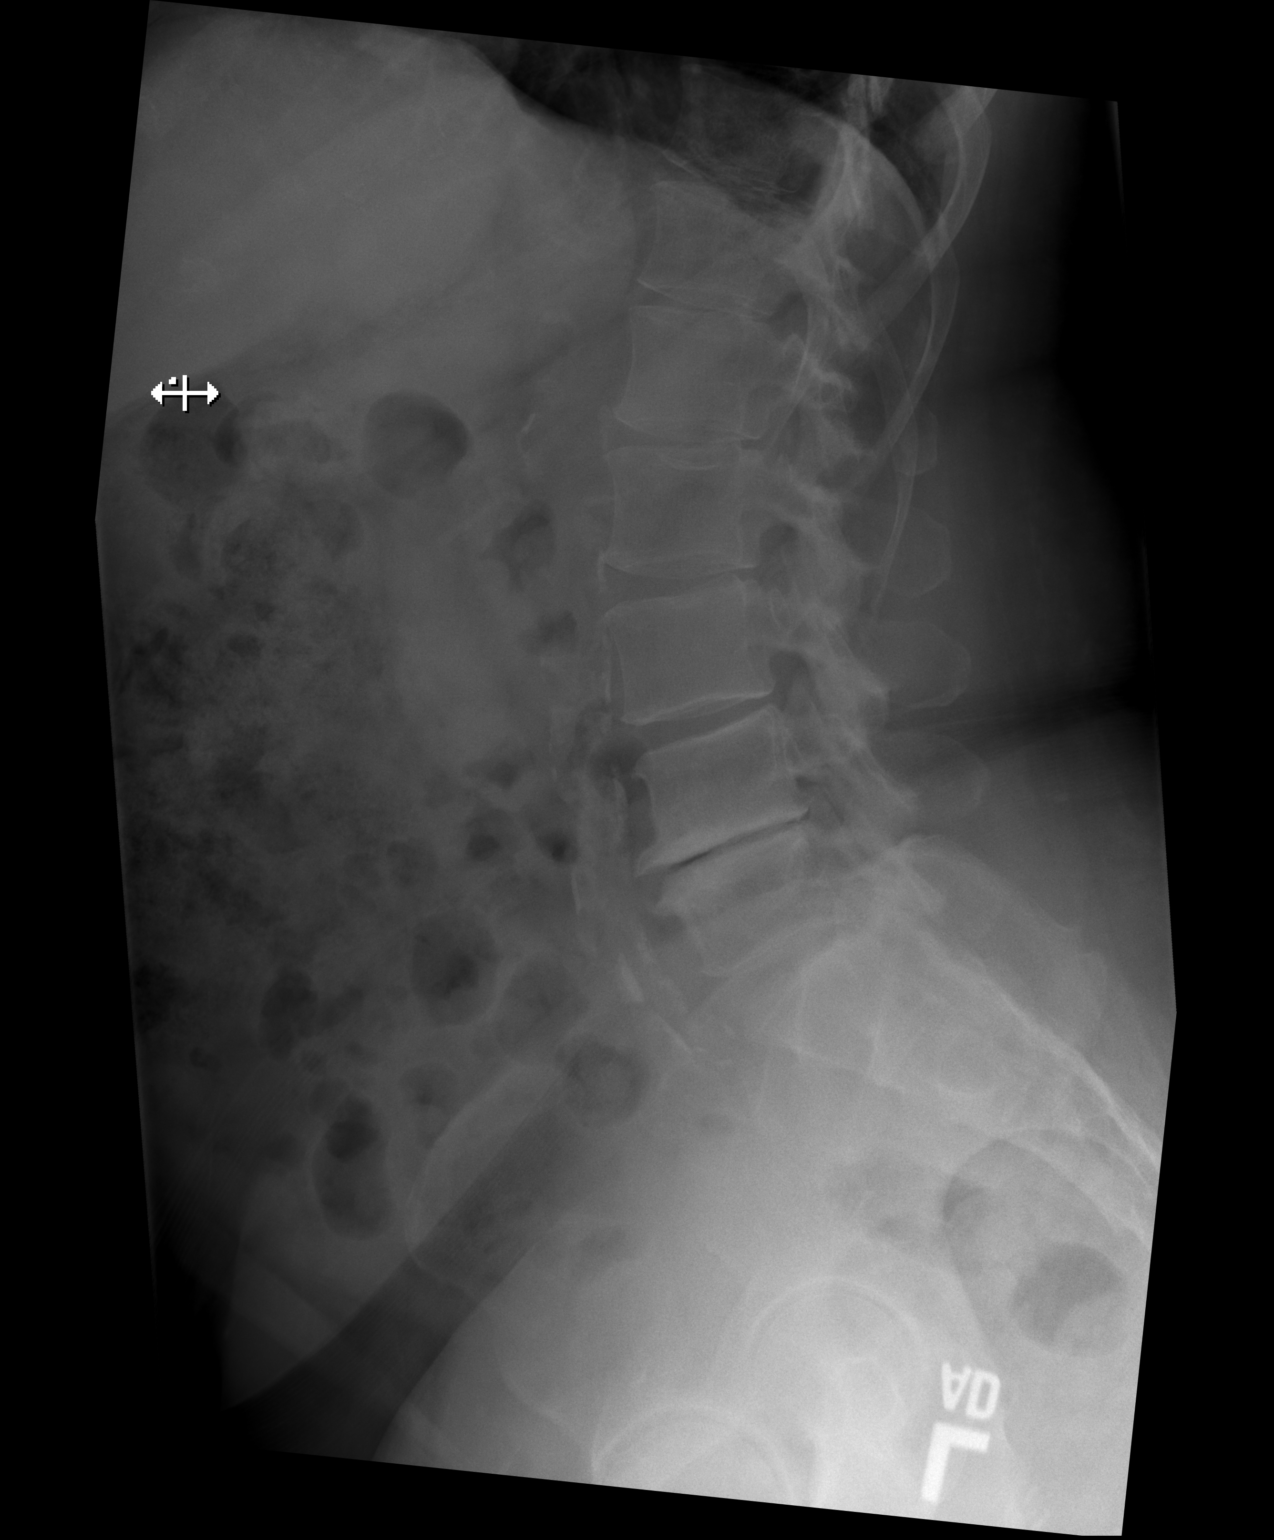
[im 3/3]
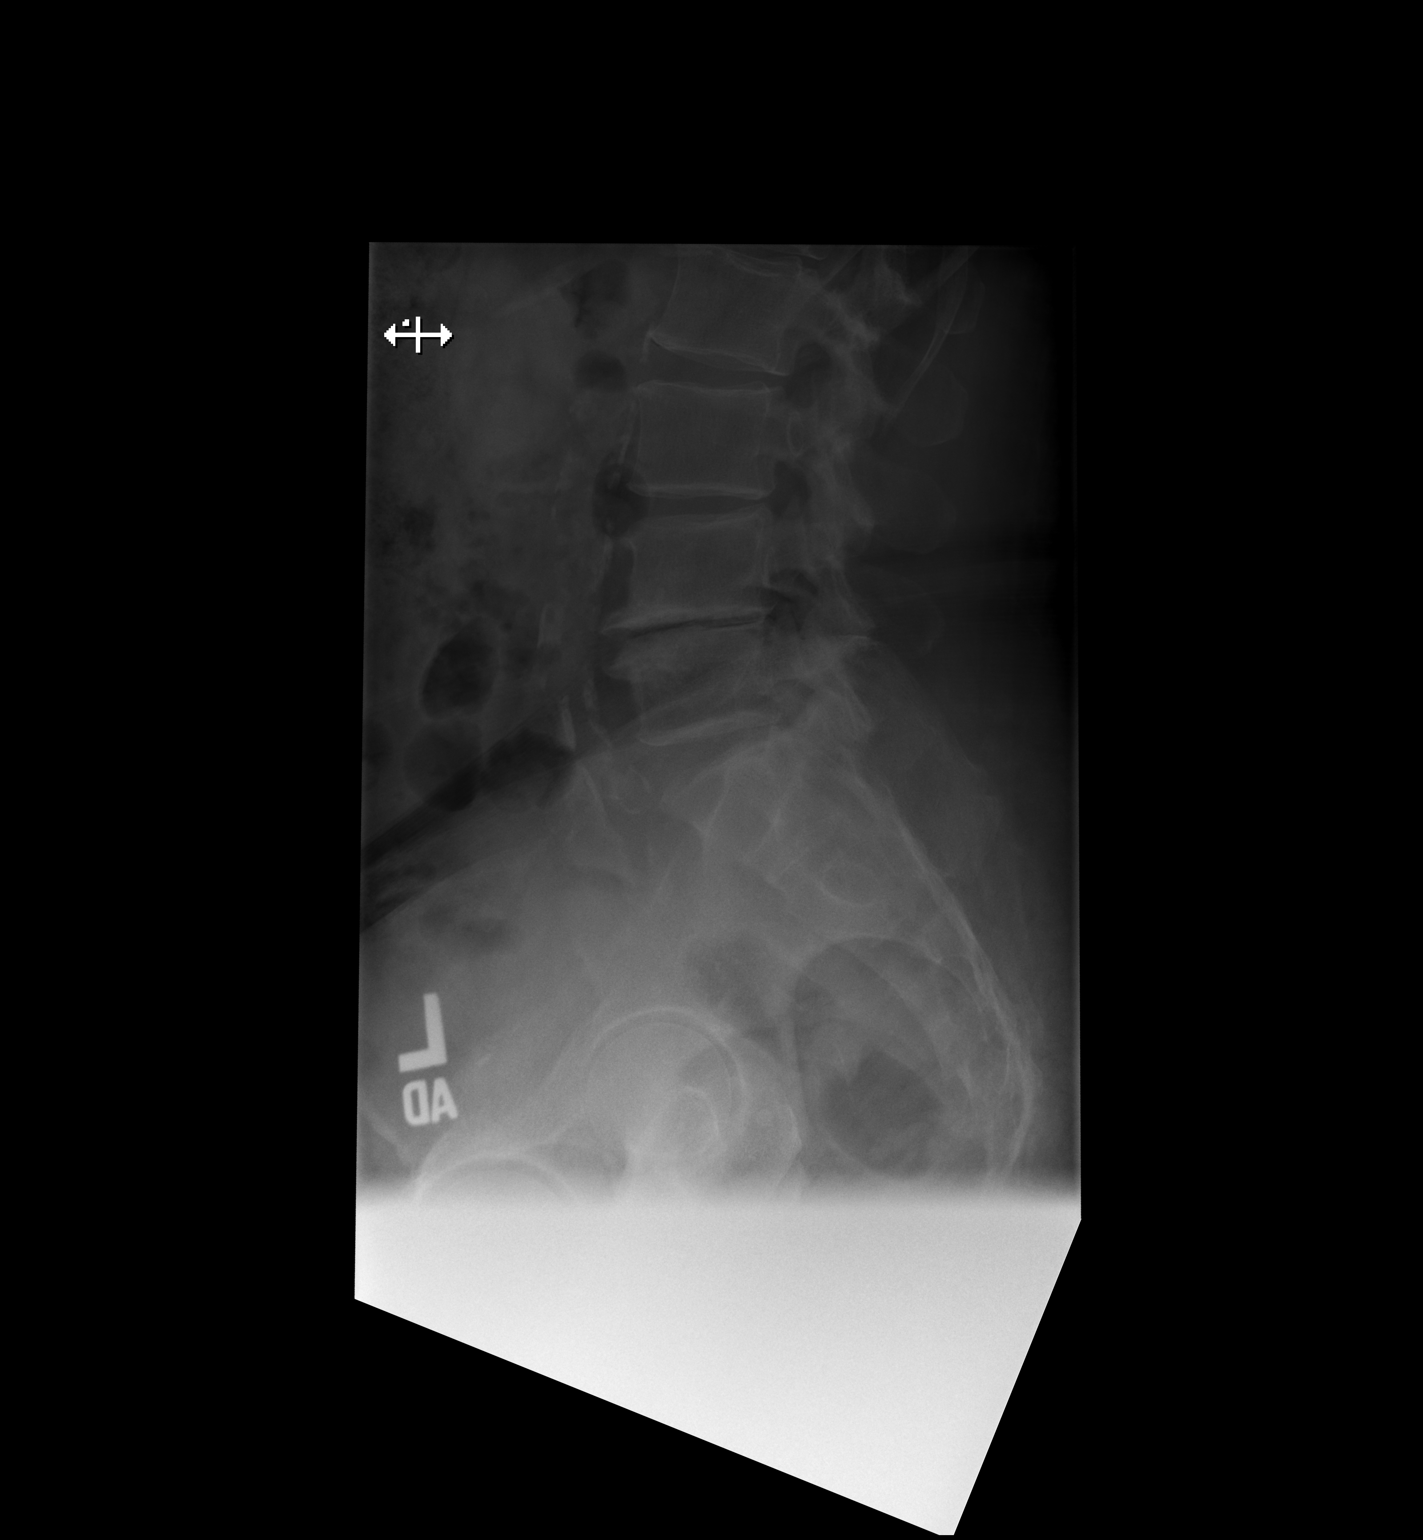

[3 of 3 positions shown; findings below may reference images not displayed]

PROCEDURE:     DXR - DXR LUMBAR SPINE AP AND LATERAL  - October 23, 2011  [DATE]

RESULT:     Lumbar spine images demonstrate a moderately large amount of
retained fecal material especially within the right colon. L4-L5
intervertebral disc space narrowing and prominent hypertrophic degenerative
endplate spurring is noted. No compression fracture or subluxation is seen.
Atherosclerotic calcification is noted in the aorta.
IMPRESSION: L4-L5 DJD. No acute bony abnormality evident.
Atherosclerotic disease present.

## 2012-05-14 ENCOUNTER — Observation Stay: Payer: Self-pay | Admitting: Internal Medicine

## 2012-05-14 LAB — COMPREHENSIVE METABOLIC PANEL
Albumin: 3.7 g/dL (ref 3.4–5.0)
Alkaline Phosphatase: 109 U/L (ref 50–136)
Anion Gap: 6 — ABNORMAL LOW (ref 7–16)
BUN: 16 mg/dL (ref 7–18)
Bilirubin,Total: 0.4 mg/dL (ref 0.2–1.0)
Calcium, Total: 9.1 mg/dL (ref 8.5–10.1)
Chloride: 107 mmol/L (ref 98–107)
Co2: 27 mmol/L (ref 21–32)
Creatinine: 0.64 mg/dL (ref 0.60–1.30)
EGFR (African American): >60
EGFR (Non-African Amer.): >60
Glucose: 101 mg/dL — ABNORMAL HIGH (ref 65–99)
Osmolality: 281 (ref 275–301)
Potassium: 4.1 mmol/L (ref 3.5–5.1)
SGOT(AST): 21 U/L (ref 15–37)
SGPT (ALT): 21 U/L
Sodium: 140 mmol/L (ref 136–145)
Total Protein: 7 g/dL (ref 6.4–8.2)

## 2012-05-14 LAB — CBC
HCT: 48.5 % — ABNORMAL HIGH (ref 35.0–47.0)
HGB: 16.6 g/dL — ABNORMAL HIGH (ref 12.0–16.0)
MCH: 33.6 pg (ref 26.0–34.0)
MCHC: 34.1 g/dL (ref 32.0–36.0)
MCV: 98 fL (ref 80–100)
Platelet: 191 10*3/uL (ref 150–440)
RBC: 4.93 10*6/uL (ref 3.80–5.20)
RDW: 13.6 % (ref 11.5–14.5)
WBC: 7.7 10*3/uL (ref 3.6–11.0)

## 2012-05-14 LAB — PRO B NATRIURETIC PEPTIDE: B-Type Natriuretic Peptide: 117 pg/mL (ref 0–125)

## 2012-05-14 LAB — CK TOTAL AND CKMB (NOT AT ARMC)
CK, Total: 64 U/L (ref 21–215)
CK-MB: 1.1 ng/mL (ref 0.5–3.6)

## 2012-05-14 LAB — TROPONIN I: Troponin-I: <0.02 ng/mL

## 2012-07-18 IMAGING — CR DG KNEE COMPLETE 4+V*L*
1 series · 5 of 5 positions shown · non-contrast
Comparison: none

REASON FOR EXAM: pain
COMMENTS:   May transport without cardiac monitor

PROCEDURE:     DXR - DXR KNEE LT COMP WITH OBLIQUES  - January 04, 2012 [DATE]
RESULT:     Comparison:  None

[Series 1: t knee ap left · 0.14mm/px · 5 of 5 slices shown]
[im 1/5]
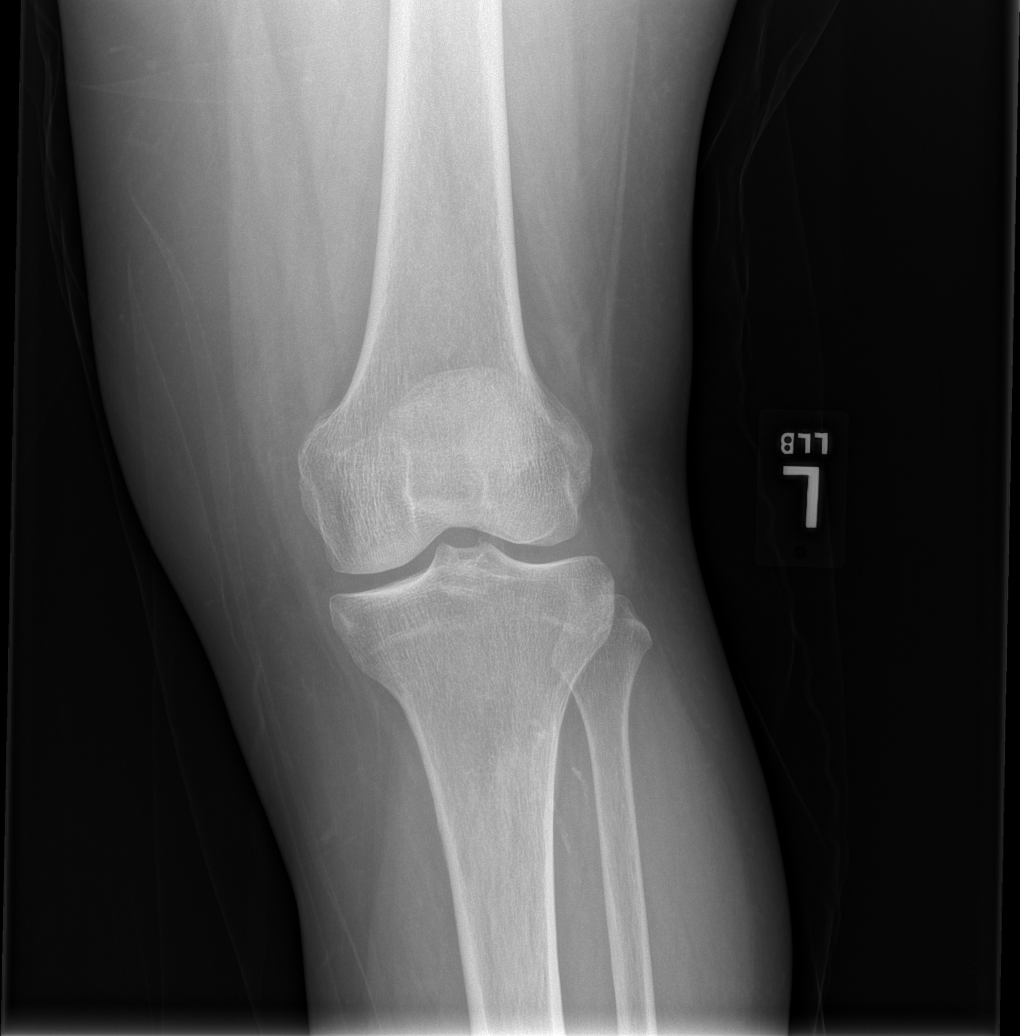
[im 2/5]
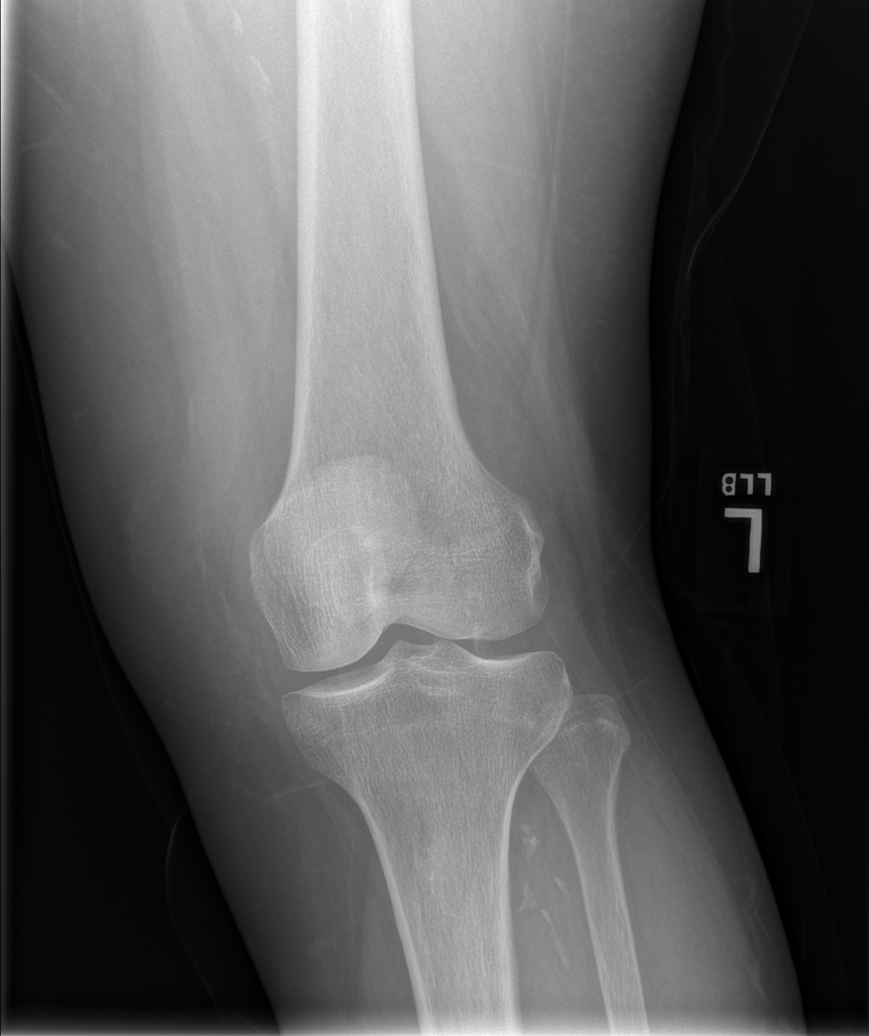
[im 3/5]
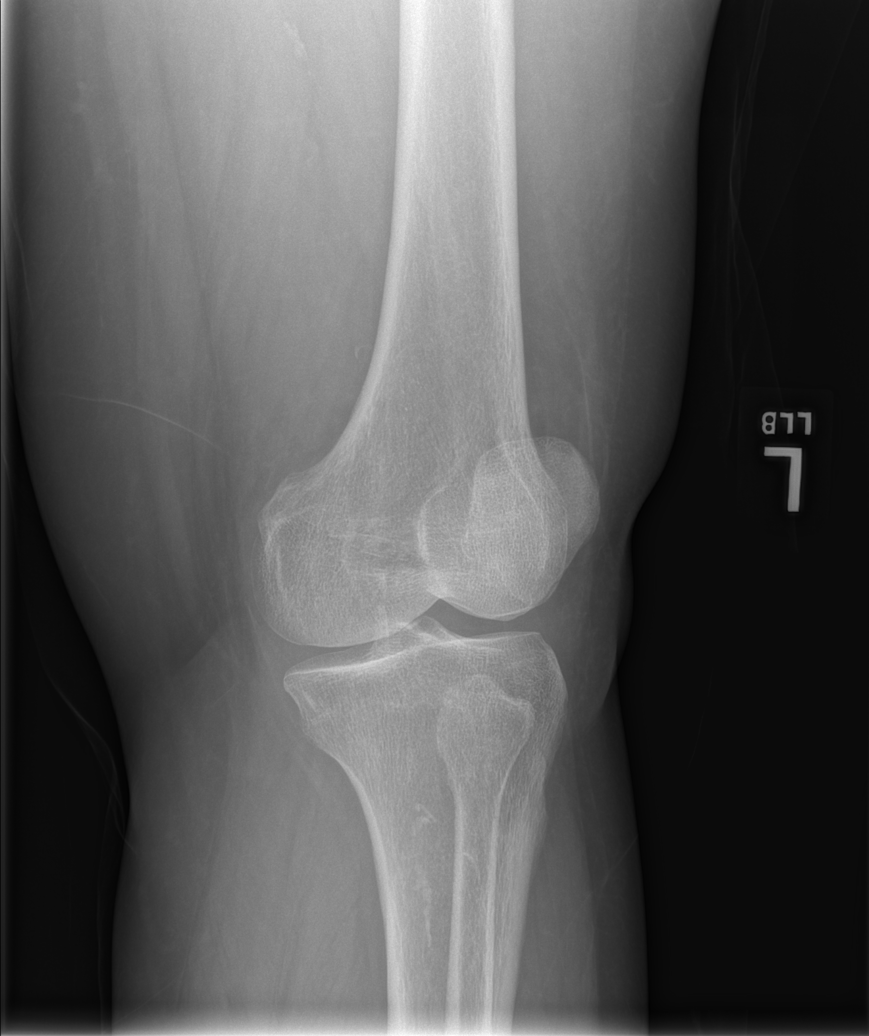
[im 4/5]
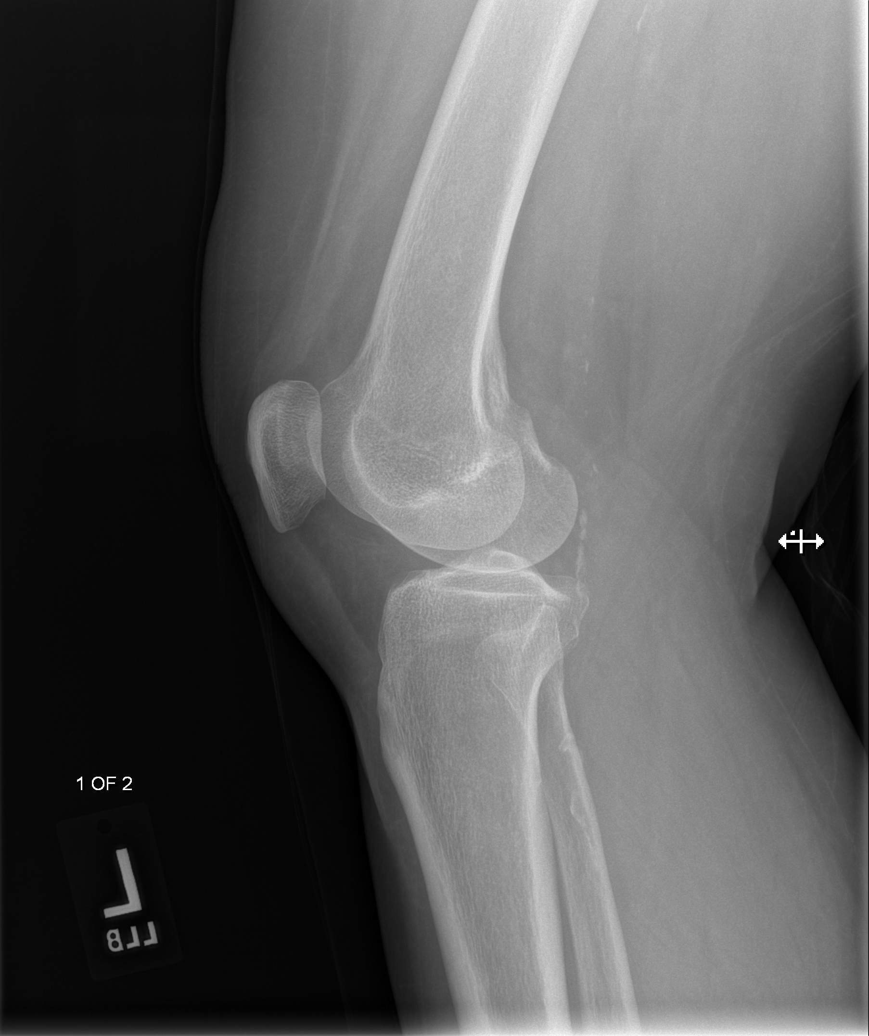
[im 5/5]
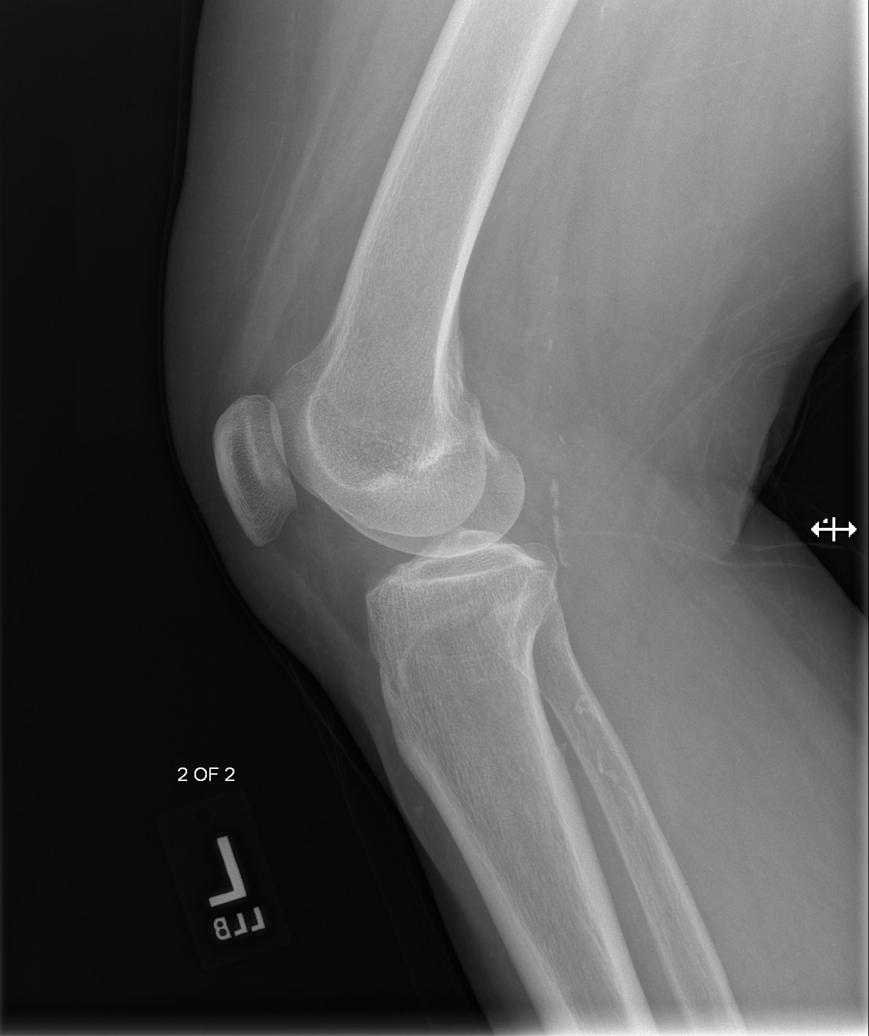

[5 of 5 positions shown; findings below may reference images not displayed]

FINDINGS: 4 views of the left knee demonstrates no acute fracture or dislocation.
There is no significant joint effusion.
IMPRESSION: No acute osseous injury of the left knee.

## 2012-07-18 IMAGING — US US EXTREM LOW VENOUS*L*
1 series · 18 of 24 positions shown · non-contrast
Comparison: none

RESULT:      Left lower extremity Duplex Doppler reveals no evidence of deep
venous thrombosis .

[Series 1: us extrem low venous*left* · 18 of 26 slices shown]
[im 1/26]
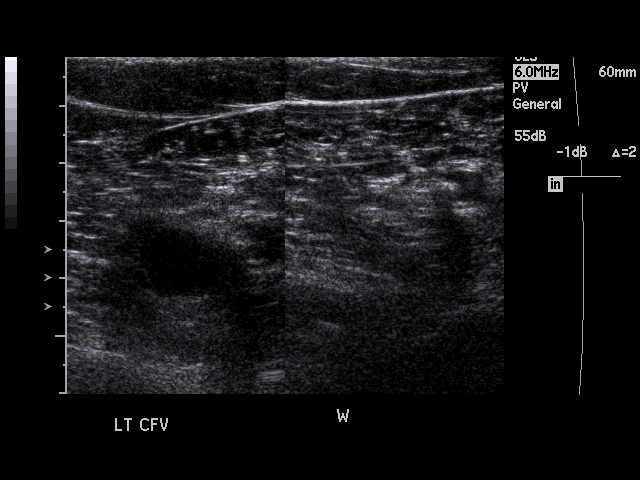
[im 3/26]
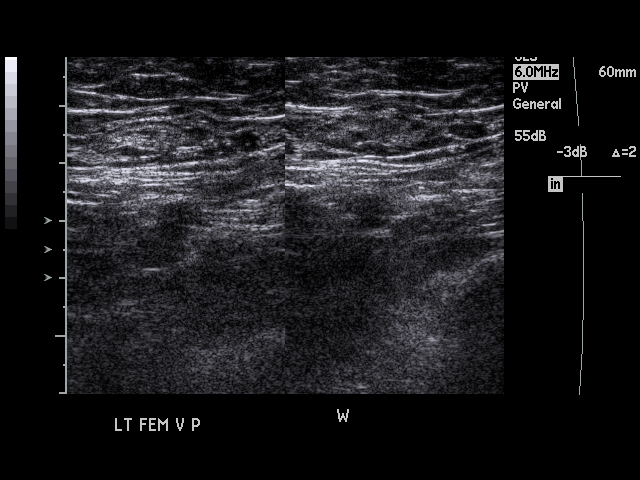
[im 4/26]
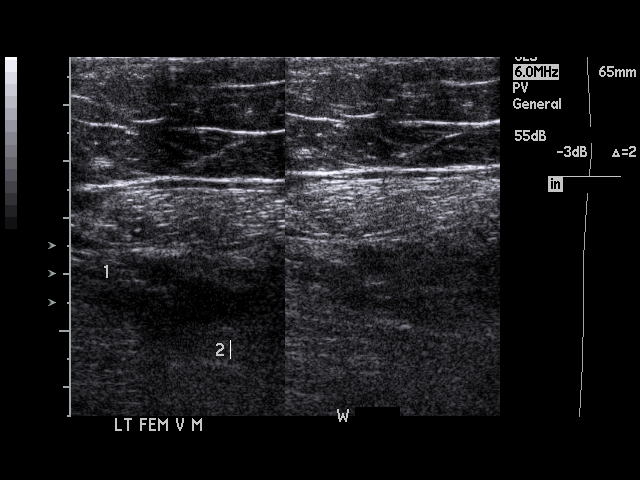
[im 5/26]
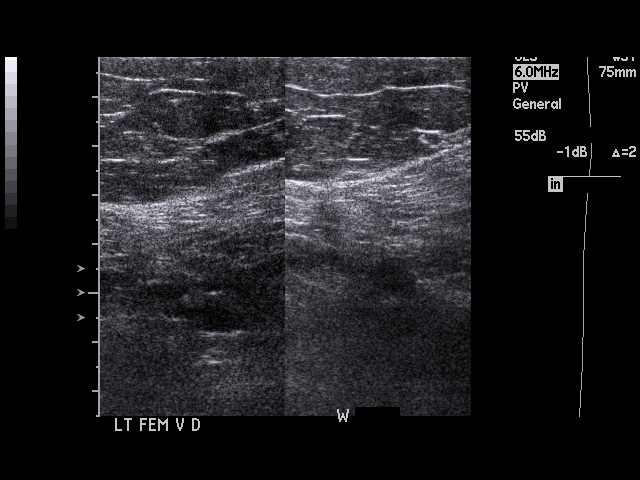
[im 7/26]
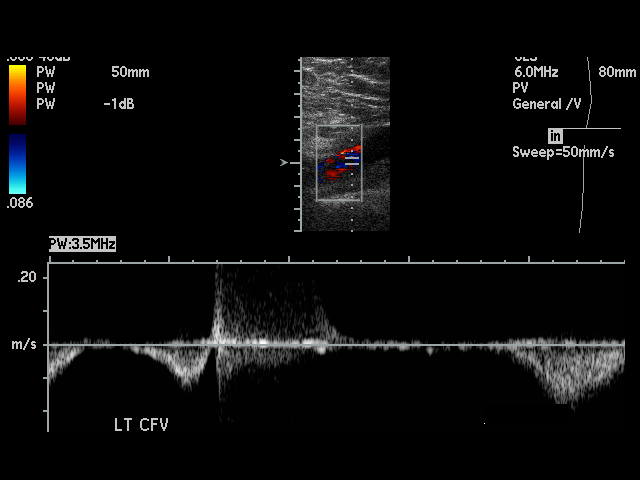
[im 8/26]
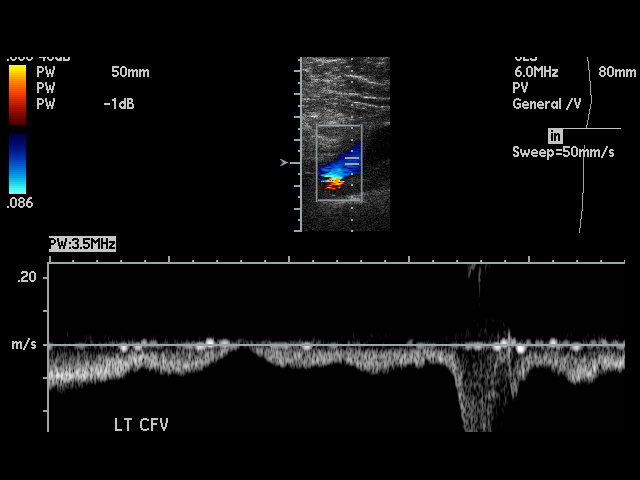
[im 9/26]
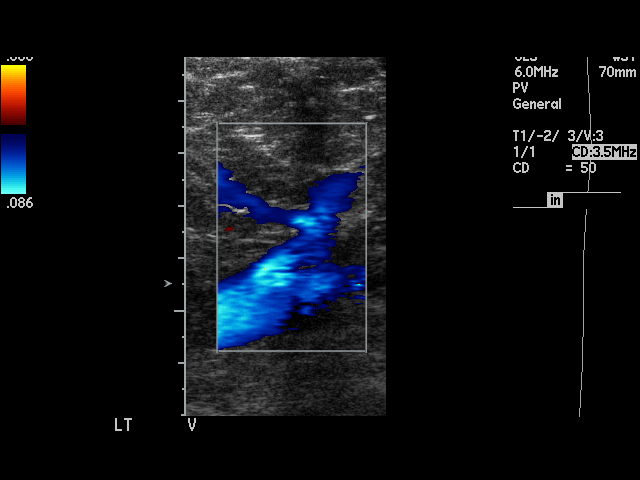
[im 11/26]
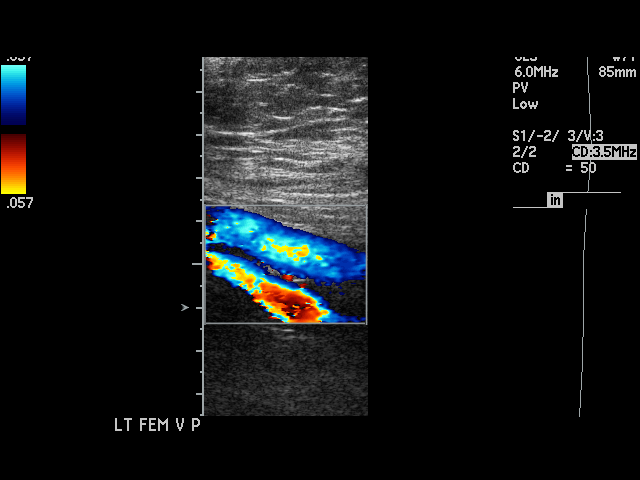
[im 12/26]
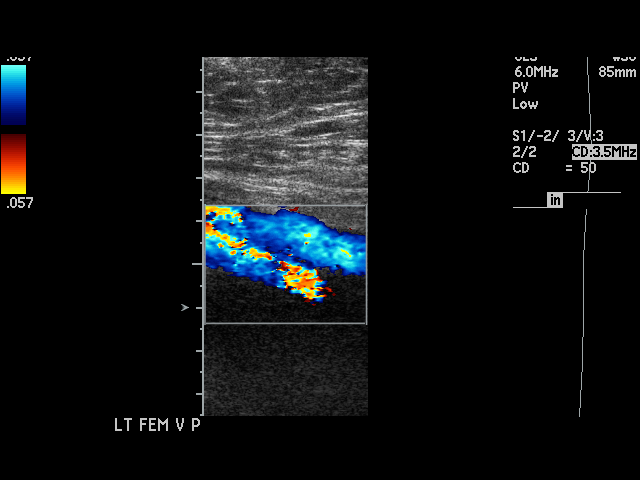
[im 14/26]
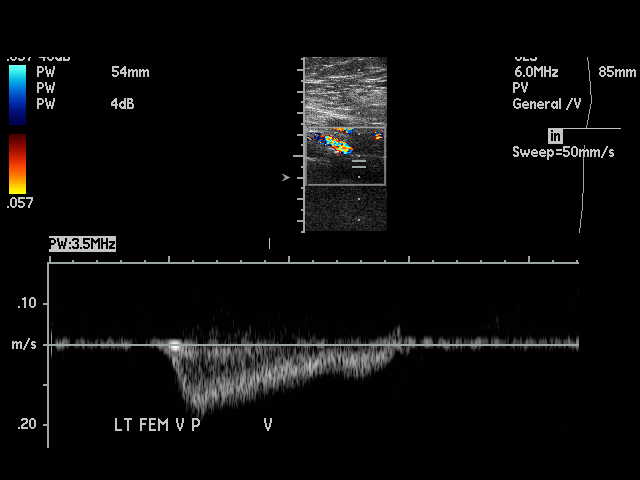
[im 16/26]
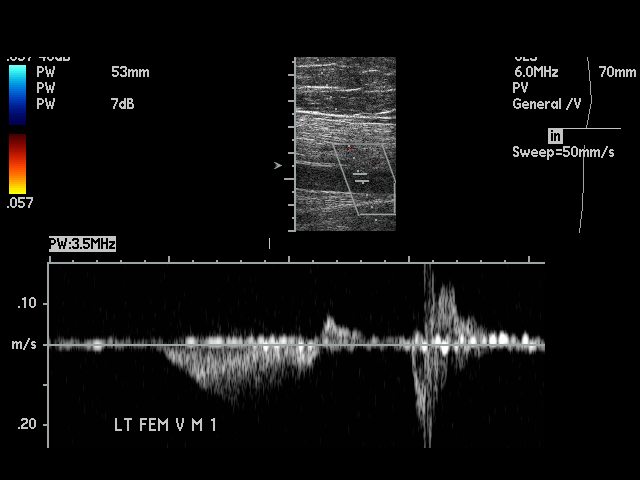
[im 17/26]
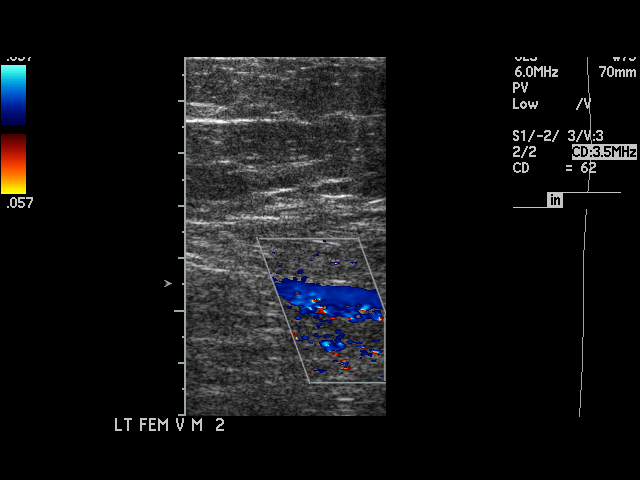
[im 18/26]
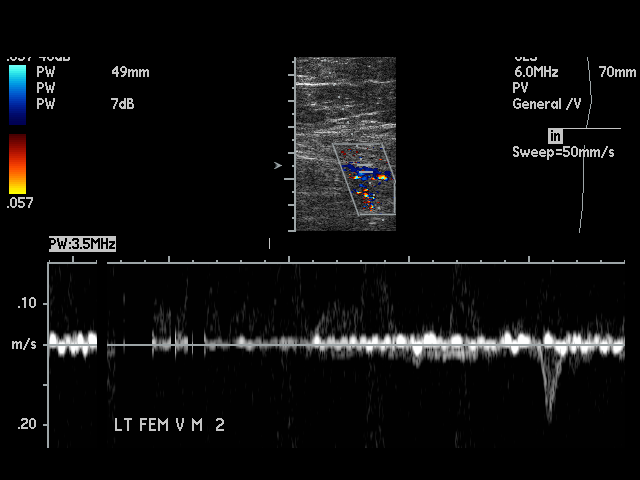
[im 20/26]
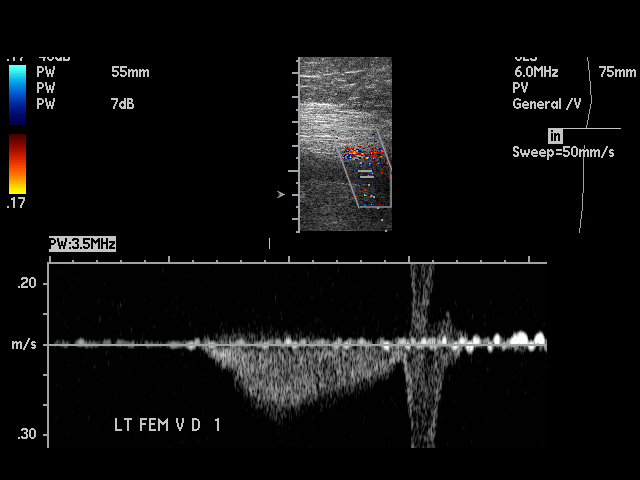
[im 21/26]
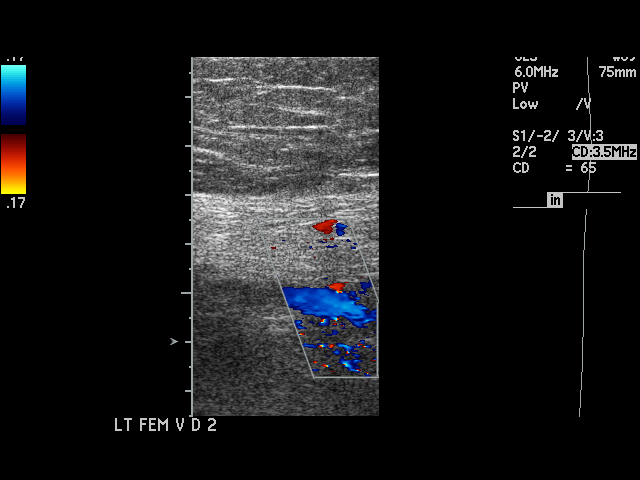
[im 22/26]
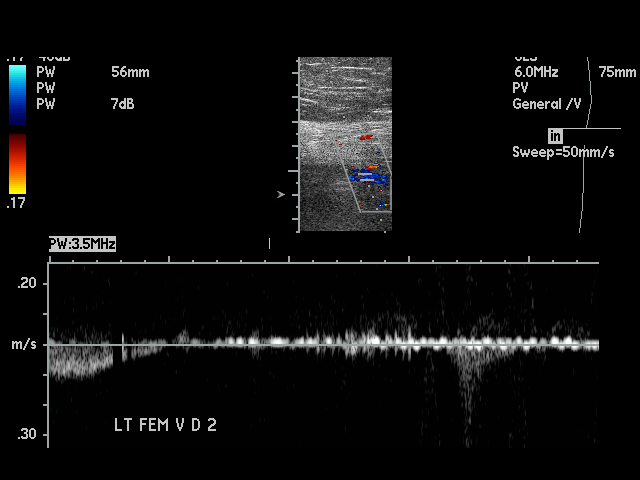
[im 24/26]
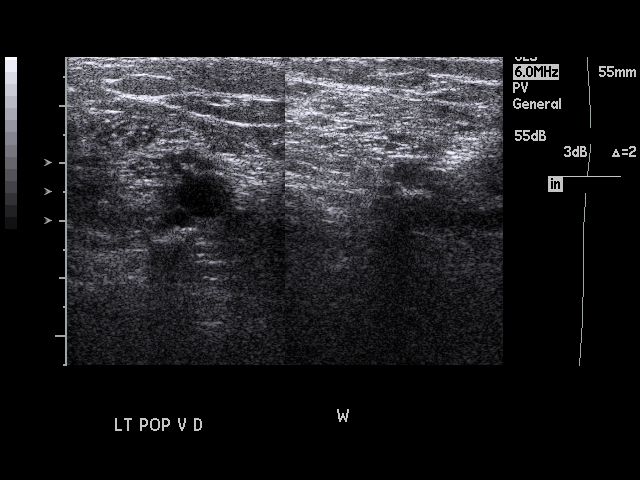
[im 26/26]
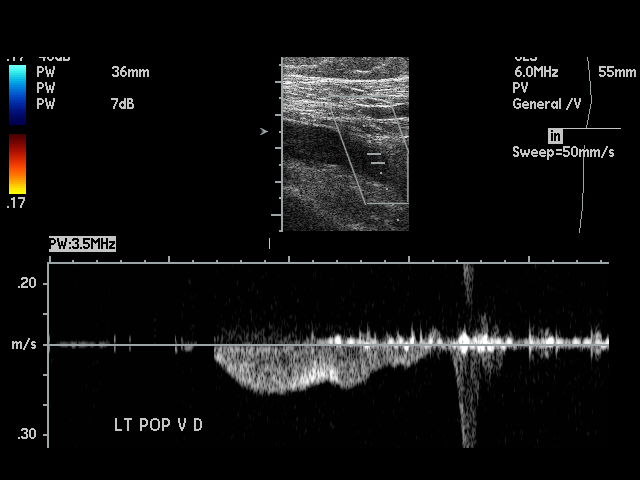

[18 of 24 positions shown; findings below may reference images not displayed]

IMPRESSION: Negative exam.

## 2012-07-18 IMAGING — CR DG CHEST 1V PORT
1 series · 1 of 1 positions shown · non-contrast
Comparison: none

REASON FOR EXAM: sob
COMMENTS:

PROCEDURE:     DXR - DXR PORTABLE CHEST SINGLE VIEW  - January 04, 2012  [DATE]
RESULT:     Comparison: None

[portable]
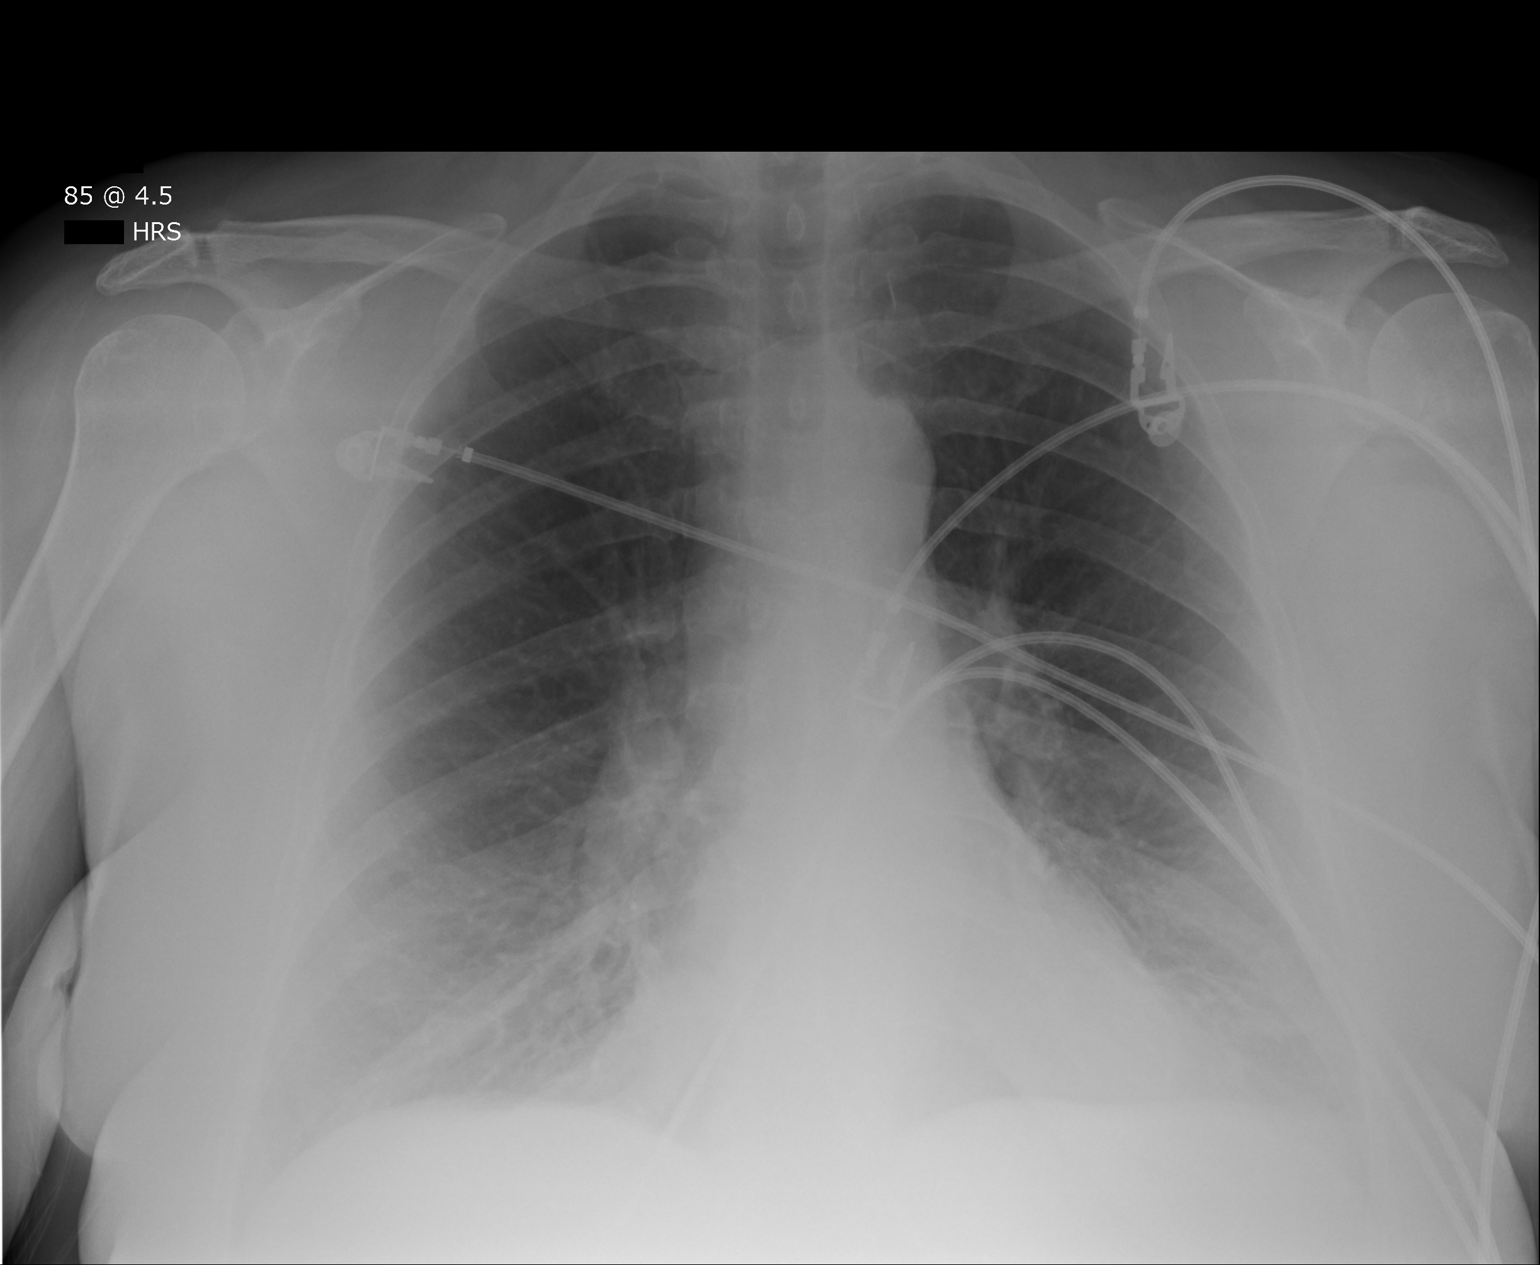

[1 of 1 positions shown; findings below may reference images not displayed]

FINDINGS: Single portable AP chest radiograph is provided.  There is no focal
parenchymal opacity, pleural effusion, or pneumothorax. Normal
cardiomediastinal silhouette. The osseous structures are unremarkable.
IMPRESSION: No acute disease of the che[REDACTED]

## 2012-11-26 IMAGING — CT CT CHEST W/ CM
1 series · 15 of 33 positions shown, 19 images · non-contrast
Comparison: none

REASON FOR EXAM: pleuritic chest pain, elevated ddimer
COMMENTS:

[Series 5: soft tissue · axial · 0.74mm/px · z∈[-488,-232]mm · 15 of 101 slices shown, 19 images]
[im 8/101  mediastinal]
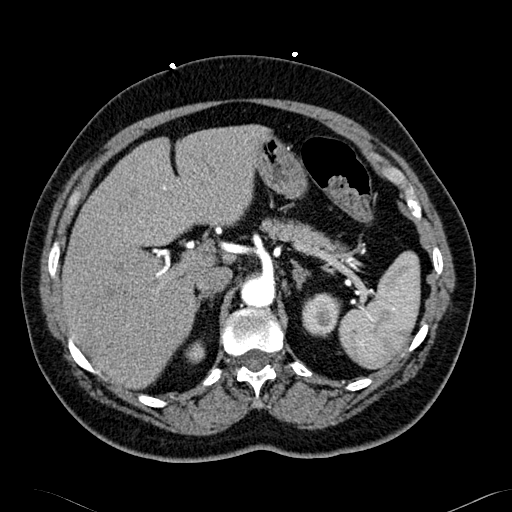
[im 8/101  lung]
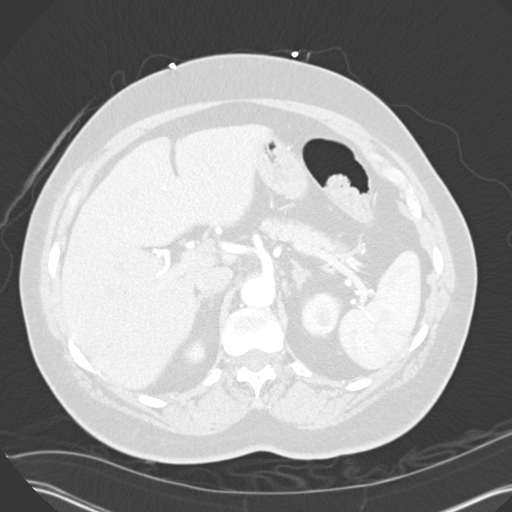
[im 15/101  lung]
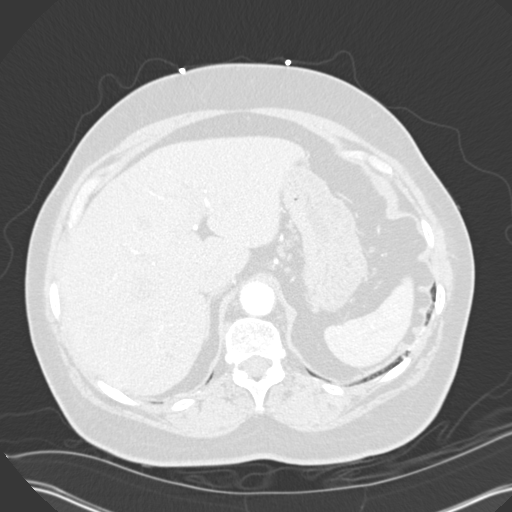
[im 21/101  lung]
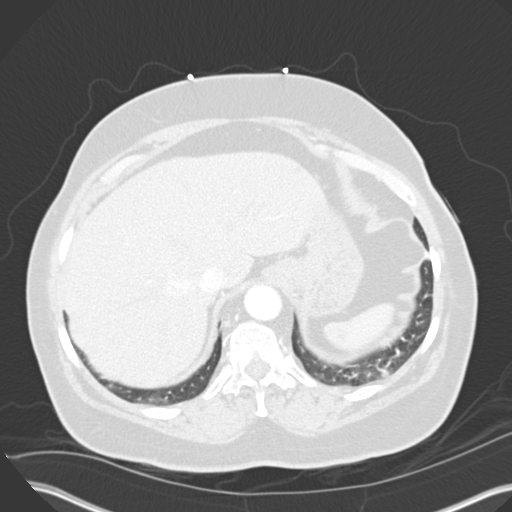
[im 26/101  lung]
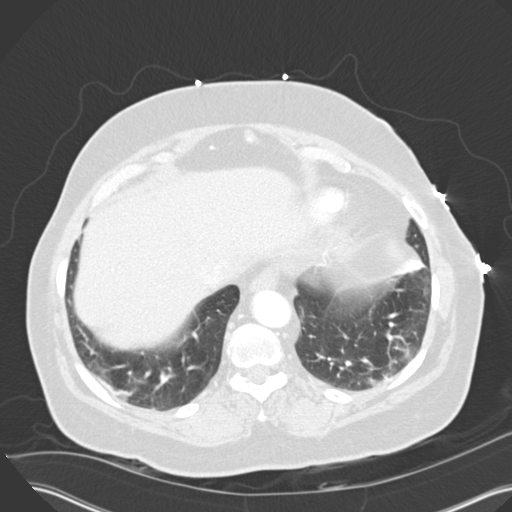
[im 34/101  mediastinal]
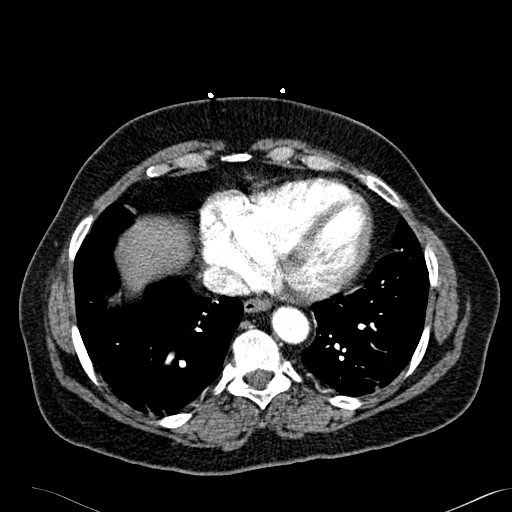
[im 34/101  lung]
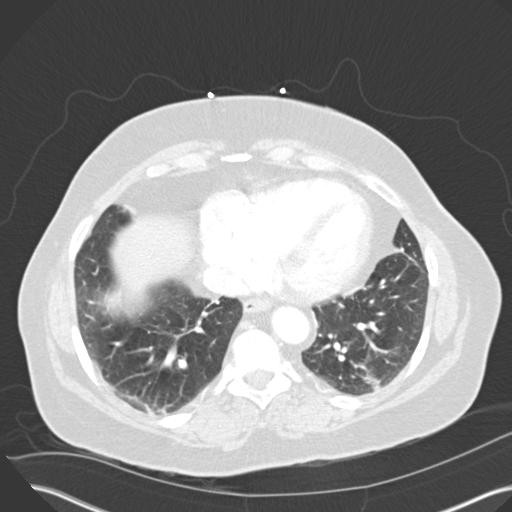
[im 41/101  lung]
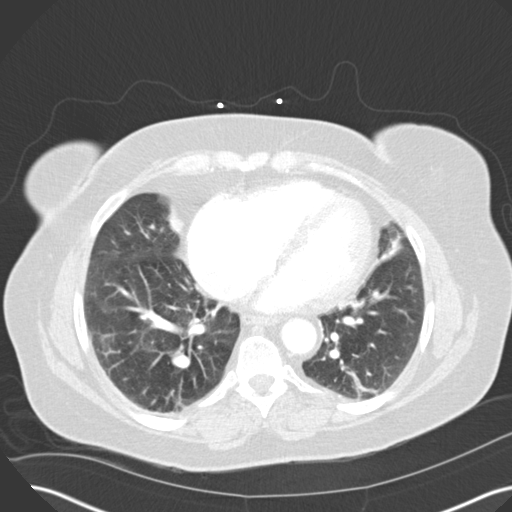
[im 48/101  lung]
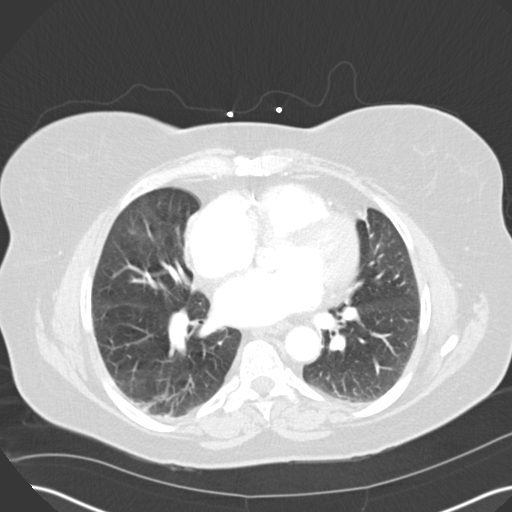
[im 52/101  lung]
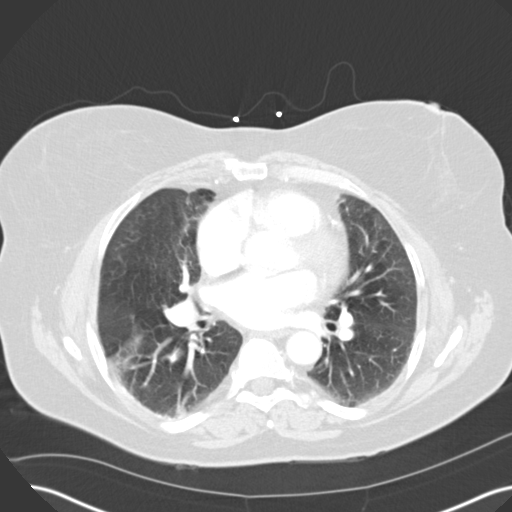
[im 56/101  mediastinal]
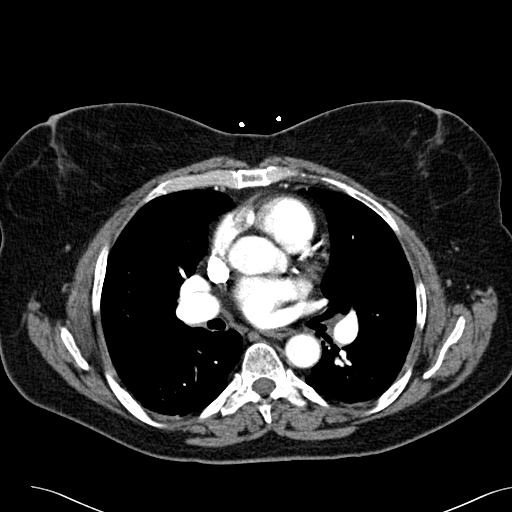
[im 56/101  lung]
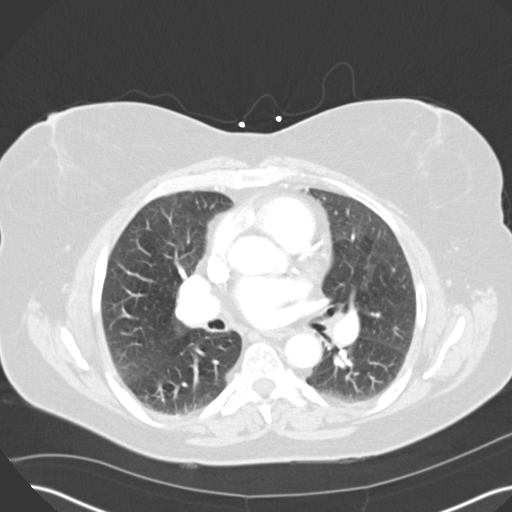
[im 61/101  lung]
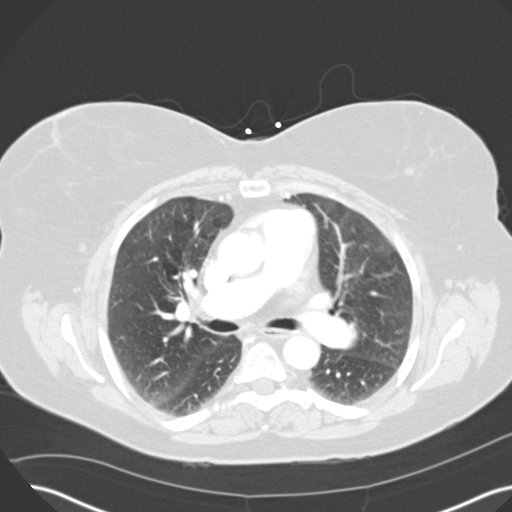
[im 67/101  lung]
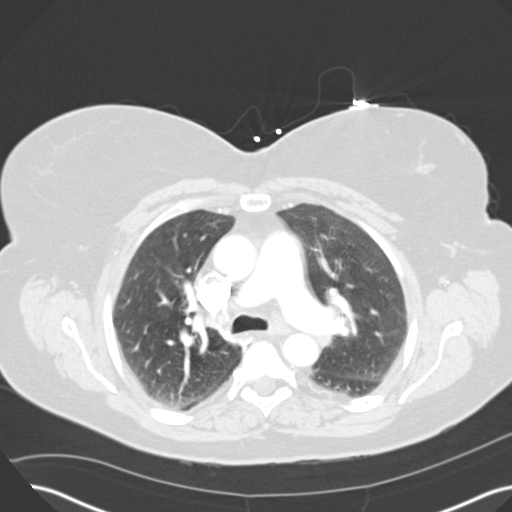
[im 75/101  lung]
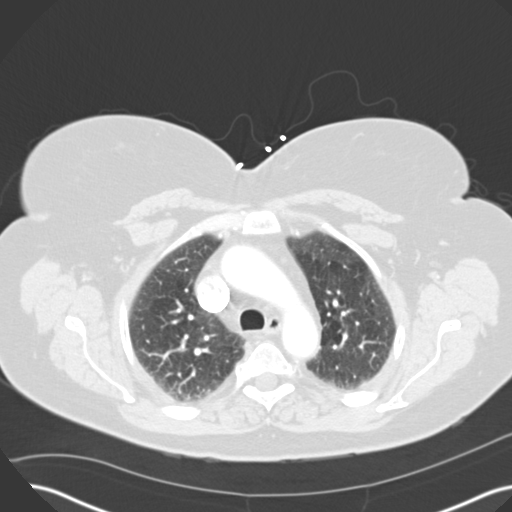
[im 81/101  mediastinal]
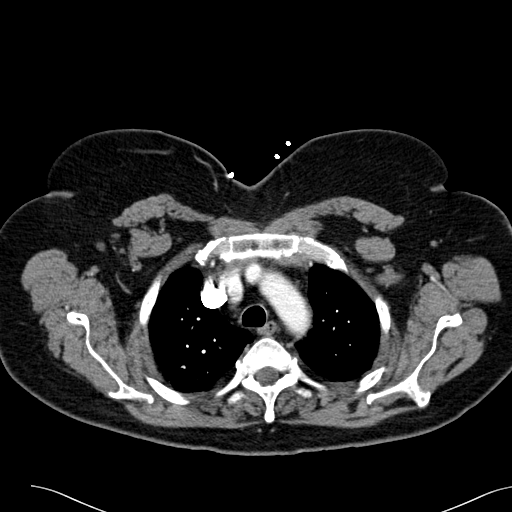
[im 81/101  lung]
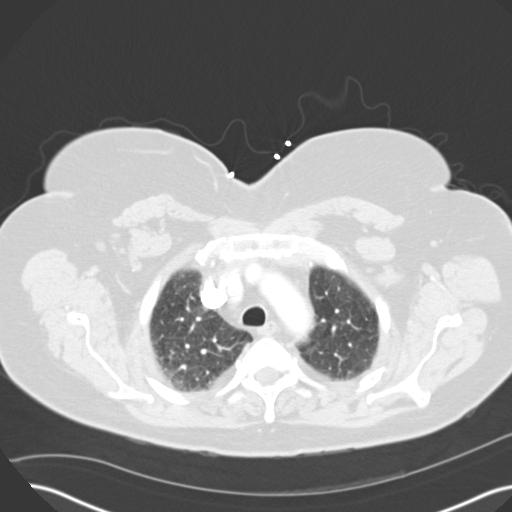
[im 86/101  lung]
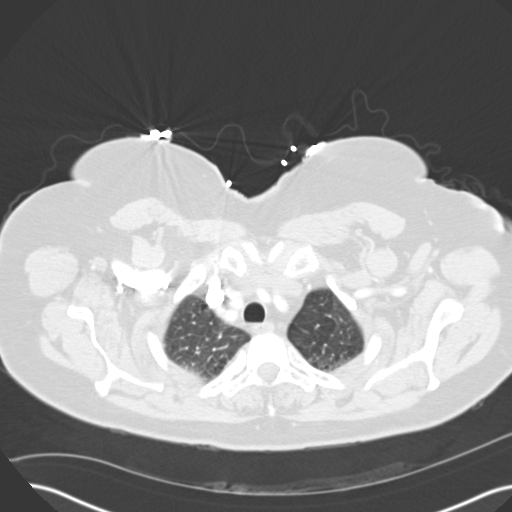
[im 93/101  lung]
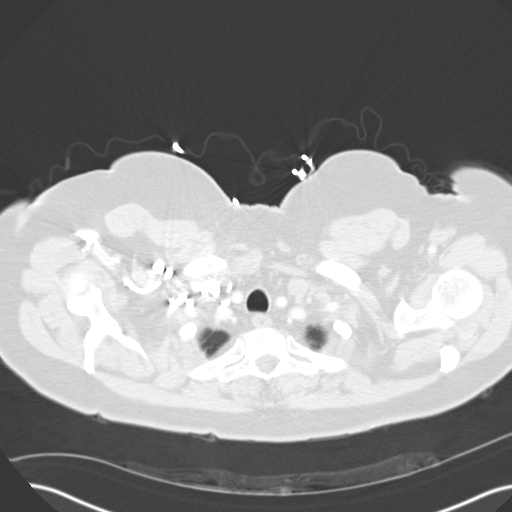

[15 of 33 positions shown; findings below may reference images not displayed]

PROCEDURE:     CT  - CT CHEST (FOR PE) W  - May 14, 2012  [DATE]

RESULT:     Axial CT scanning was performed through the chest with
reconstructions at 3 mm intervals and slice thicknesses following
intravenous ministration of 100 cc of [id number]. Comparison is made to a
chest x-ray of today's date. Review of multiplanar reconstructed images was
performed separately on the VIA monitor.

Contrast within the pulmonary arterial tree is normal in appearance. There
are no filling defects to suggest an acute pulmonary embolism. The cardiac
chambers are top normal in size. The caliber of the thoracic aorta is
normal. There is no evidence of a false lumen. There is no mediastinal nor
hilar lymphadenopathy. There is no pleural nor pericardial effusion.

At lung windows there is subsegmental atelectasis at the bases. No classic
alveolar infiltrates are demonstrated. There are no pulmonary parenchymal
masses.

Within the upper abdomen there are scattered hypodensities within the liver
with the largest seen on image 90 measuring 10 mm in diameter. These likely
reflect cysts. These were demonstrated on an abdominal CT scan January 30, 2008. There is no intrahepatic ductal dilation. The gallbladder is partially
imaged and grossly normal. The observed portions of the spleen and adrenal
glands as well as the kidneys and pancreas exhibit no acute abnormalities.
The lumbar vertebral bodies are preserved in height.
IMPRESSION: 1. There is no evidence of an acute pulmonary embolism. There is no acute
thoracic aortic pathology.
2. There is bibasilar atelectasis with no focal alveolar pneumonia
demonstrated. There may be underlying emphysema.
3. The cardiac chambers are top normal in size but there is no evidence of
CHF.
4. The observed portions of the bony thorax exhibit no acute abnormality.
5. There are hypodensities within the liver which have been previously
demonstrated which are most compatible with cysts.

## 2012-11-26 IMAGING — CR DG CHEST 2V
1 series · 2 of 2 positions shown · non-contrast
Comparison: none

REASON FOR EXAM: SOB
COMMENTS:

PROCEDURE:     DXR - DXR CHEST PA (OR AP) AND LATERAL  - May 14, 2012 [DATE]
RESULT:     Comparison: 01/04/2012

[Series 1: pa · 0.17mm/px · 2 of 2 slices shown]
[im 1/2]
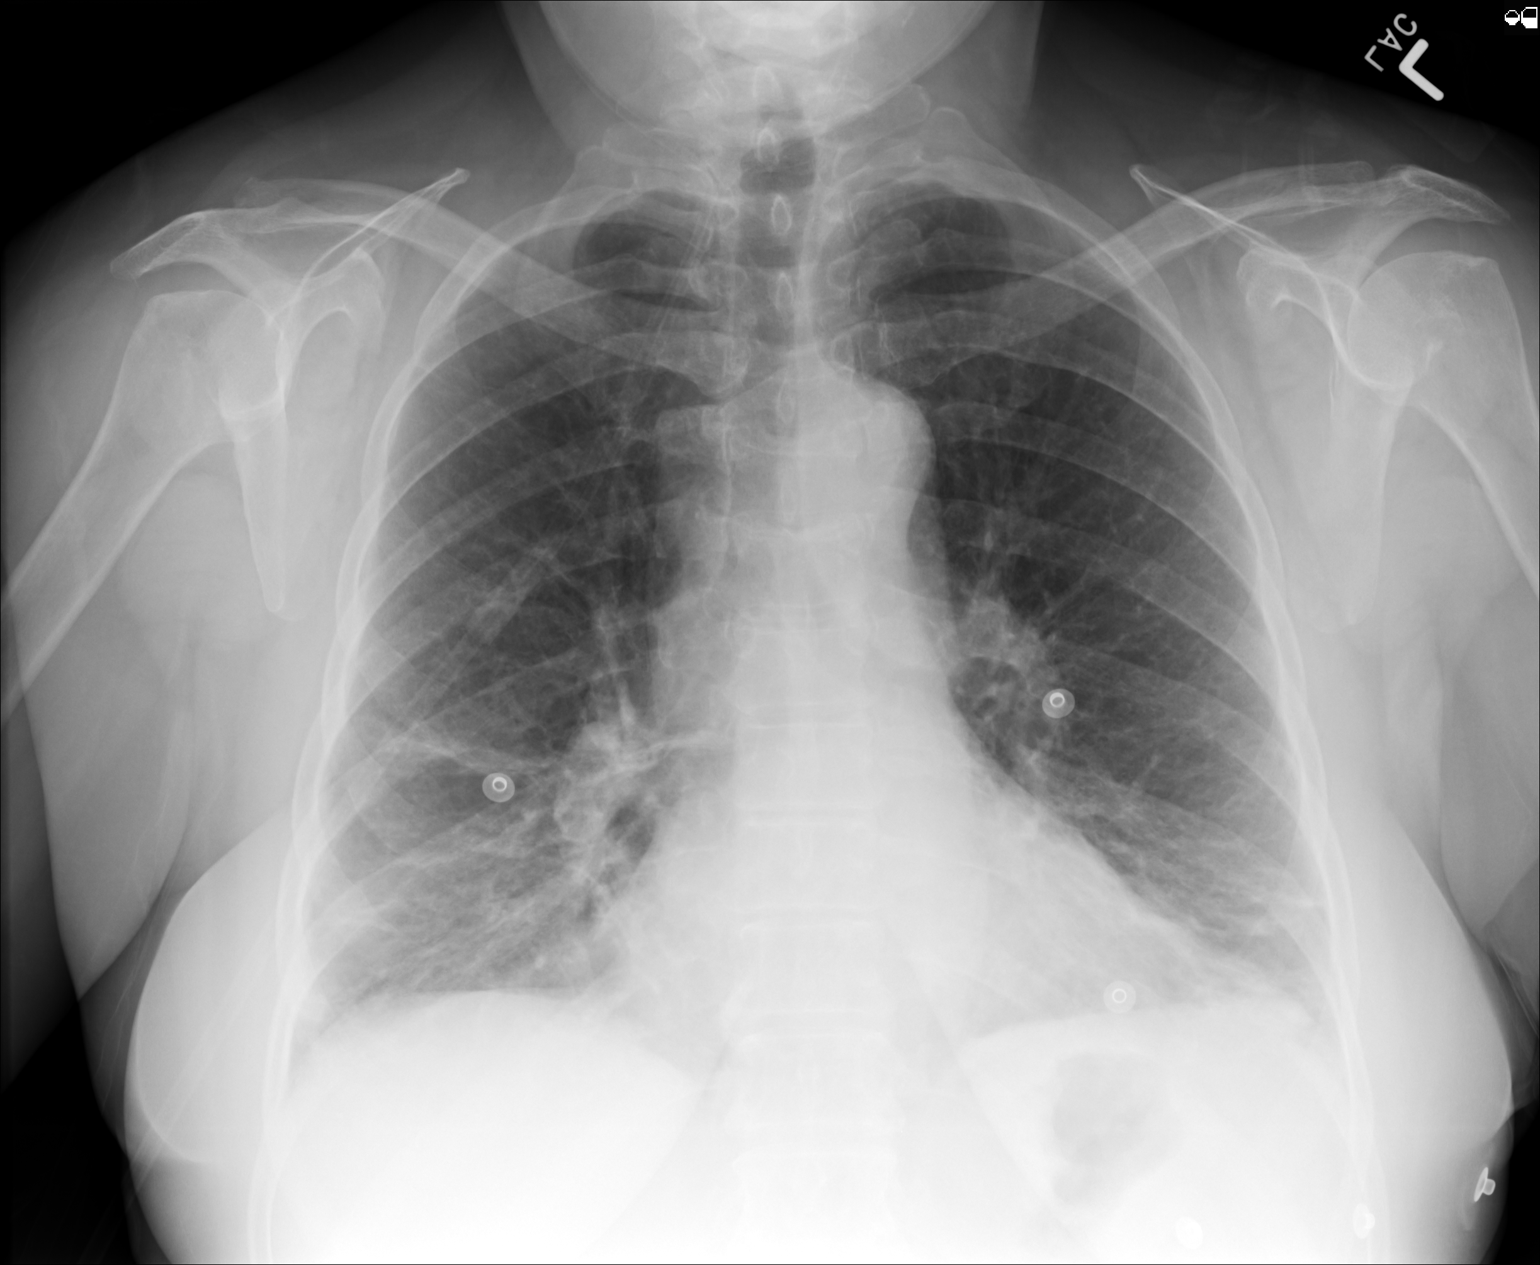
[im 2/2]
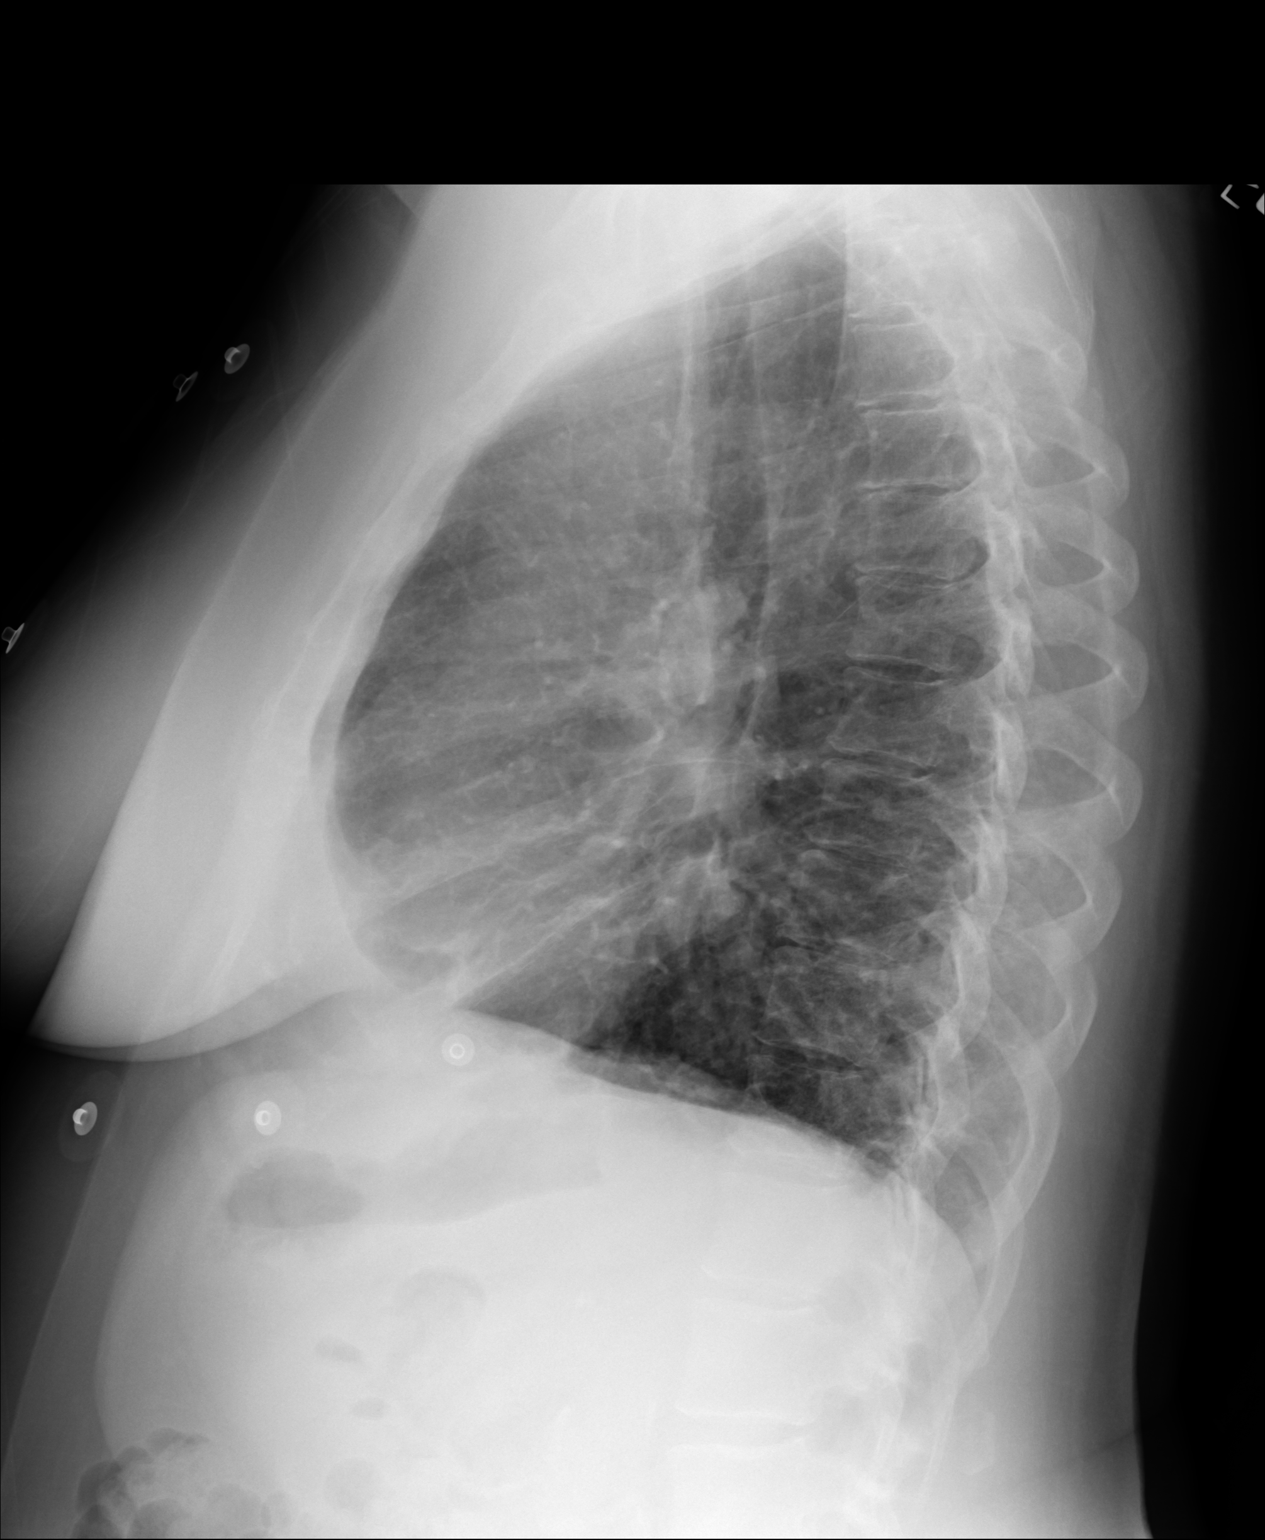

[2 of 2 positions shown; findings below may reference images not displayed]

FINDINGS: PA and lateral chest radiographs are provided. There is bilateral diffuse
interstitial thickening likely representing chronic interstitial disease,
but superimposed mild interstitial edema versus interstitial pneumonitis
secondary to an infectious or inflammatory etiology cannot be excluded.
There is no focal parenchymal opacity, pleural effusion, or pneumothorax.
The heart and mediastinum are unremarkable.  The osseous structures are
unremarkable.
IMPRESSION: There is bilateral diffuse interstitial thickening likely representing
chronic interstitial disease, but superimposed mild interstitial edema
versus interstitial pneumonitis secondary to an infectious or inflammatory
etiology cannot be excluded.

[REDACTED]

## 2012-12-27 ENCOUNTER — Emergency Department: Payer: Self-pay | Admitting: Emergency Medicine

## 2012-12-27 LAB — CBC
HCT: 46.5 % (ref 35.0–47.0)
HGB: 15.5 g/dL (ref 12.0–16.0)
MCH: 33.2 pg (ref 26.0–34.0)
MCHC: 33.3 g/dL (ref 32.0–36.0)
MCV: 100 fL (ref 80–100)
Platelet: 166 10*3/uL (ref 150–440)
RBC: 4.66 10*6/uL (ref 3.80–5.20)
RDW: 14.6 % — ABNORMAL HIGH (ref 11.5–14.5)
WBC: 8.1 10*3/uL (ref 3.6–11.0)

## 2012-12-27 LAB — BASIC METABOLIC PANEL
Anion Gap: 0 — ABNORMAL LOW (ref 7–16)
BUN: 17 mg/dL (ref 7–18)
Calcium, Total: 8.5 mg/dL (ref 8.5–10.1)
Chloride: 110 mmol/L — ABNORMAL HIGH (ref 98–107)
Co2: 32 mmol/L (ref 21–32)
Creatinine: 0.61 mg/dL (ref 0.60–1.30)
EGFR (African American): >60
EGFR (Non-African Amer.): >60
Glucose: 88 mg/dL (ref 65–99)
Osmolality: 284 (ref 275–301)
Potassium: 4.5 mmol/L (ref 3.5–5.1)
Sodium: 142 mmol/L (ref 136–145)

## 2012-12-27 LAB — CK TOTAL AND CKMB (NOT AT ARMC)
CK, Total: 72 U/L (ref 21–215)
CK-MB: 1 ng/mL (ref 0.5–3.6)

## 2012-12-27 LAB — TROPONIN I: Troponin-I: <0.02 ng/mL

## 2012-12-30 ENCOUNTER — Emergency Department: Payer: Self-pay | Admitting: Unknown Physician Specialty

## 2012-12-30 LAB — CBC
HCT: 44.9 % (ref 35.0–47.0)
HGB: 14.7 g/dL (ref 12.0–16.0)
MCH: 32.7 pg (ref 26.0–34.0)
MCHC: 32.8 g/dL (ref 32.0–36.0)
MCV: 99 fL (ref 80–100)
Platelet: 164 10*3/uL (ref 150–440)
RBC: 4.51 10*6/uL (ref 3.80–5.20)
RDW: 14.5 % (ref 11.5–14.5)
WBC: 13.4 10*3/uL — ABNORMAL HIGH (ref 3.6–11.0)

## 2012-12-30 LAB — BASIC METABOLIC PANEL
Anion Gap: 5 — ABNORMAL LOW (ref 7–16)
BUN: 18 mg/dL (ref 7–18)
Calcium, Total: 8.7 mg/dL (ref 8.5–10.1)
Chloride: 105 mmol/L (ref 98–107)
Co2: 31 mmol/L (ref 21–32)
Creatinine: 0.71 mg/dL (ref 0.60–1.30)
EGFR (African American): >60
EGFR (Non-African Amer.): >60
Glucose: 127 mg/dL — ABNORMAL HIGH (ref 65–99)
Osmolality: 285 (ref 275–301)
Potassium: 3.6 mmol/L (ref 3.5–5.1)
Sodium: 141 mmol/L (ref 136–145)

## 2012-12-30 LAB — HEPATIC FUNCTION PANEL A (ARMC)
Albumin: 3.4 g/dL (ref 3.4–5.0)
Alkaline Phosphatase: 102 U/L (ref 50–136)
Bilirubin, Direct: 0.1 mg/dL (ref 0.00–0.20)
Bilirubin,Total: 0.4 mg/dL (ref 0.2–1.0)
SGOT(AST): 35 U/L (ref 15–37)
SGPT (ALT): 65 U/L (ref 12–78)
Total Protein: 7 g/dL (ref 6.4–8.2)

## 2012-12-30 LAB — TROPONIN I: Troponin-I: <0.02 ng/mL

## 2013-01-05 LAB — CULTURE, BLOOD (SINGLE)

## 2013-02-28 ENCOUNTER — Emergency Department: Payer: Self-pay | Admitting: Emergency Medicine

## 2013-02-28 LAB — CBC
HCT: 44.3 % (ref 35.0–47.0)
HGB: 15.1 g/dL (ref 12.0–16.0)
MCH: 32.9 pg (ref 26.0–34.0)
MCHC: 34.1 g/dL (ref 32.0–36.0)
MCV: 97 fL (ref 80–100)
Platelet: 167 10*3/uL (ref 150–440)
RBC: 4.59 10*6/uL (ref 3.80–5.20)
RDW: 13.9 % (ref 11.5–14.5)
WBC: 8.6 10*3/uL (ref 3.6–11.0)

## 2013-02-28 LAB — BASIC METABOLIC PANEL
Anion Gap: 2 — ABNORMAL LOW (ref 7–16)
BUN: 16 mg/dL (ref 7–18)
Calcium, Total: 8.7 mg/dL (ref 8.5–10.1)
Chloride: 109 mmol/L — ABNORMAL HIGH (ref 98–107)
Co2: 30 mmol/L (ref 21–32)
Creatinine: 0.56 mg/dL — ABNORMAL LOW (ref 0.60–1.30)
EGFR (African American): >60
EGFR (Non-African Amer.): >60
Glucose: 119 mg/dL — ABNORMAL HIGH (ref 65–99)
Osmolality: 284 (ref 275–301)
Potassium: 3.9 mmol/L (ref 3.5–5.1)
Sodium: 141 mmol/L (ref 136–145)

## 2013-04-08 ENCOUNTER — Emergency Department: Payer: Self-pay | Admitting: Emergency Medicine

## 2013-04-24 IMAGING — CR DG CHEST 2V
1 series · 2 of 2 positions shown · non-contrast
Comparison: none

REASON FOR EXAM: SOB
COMMENTS:

[Series 1: w chest pa · 0.14mm/px · 2 of 2 slices shown]
[im 1/2]
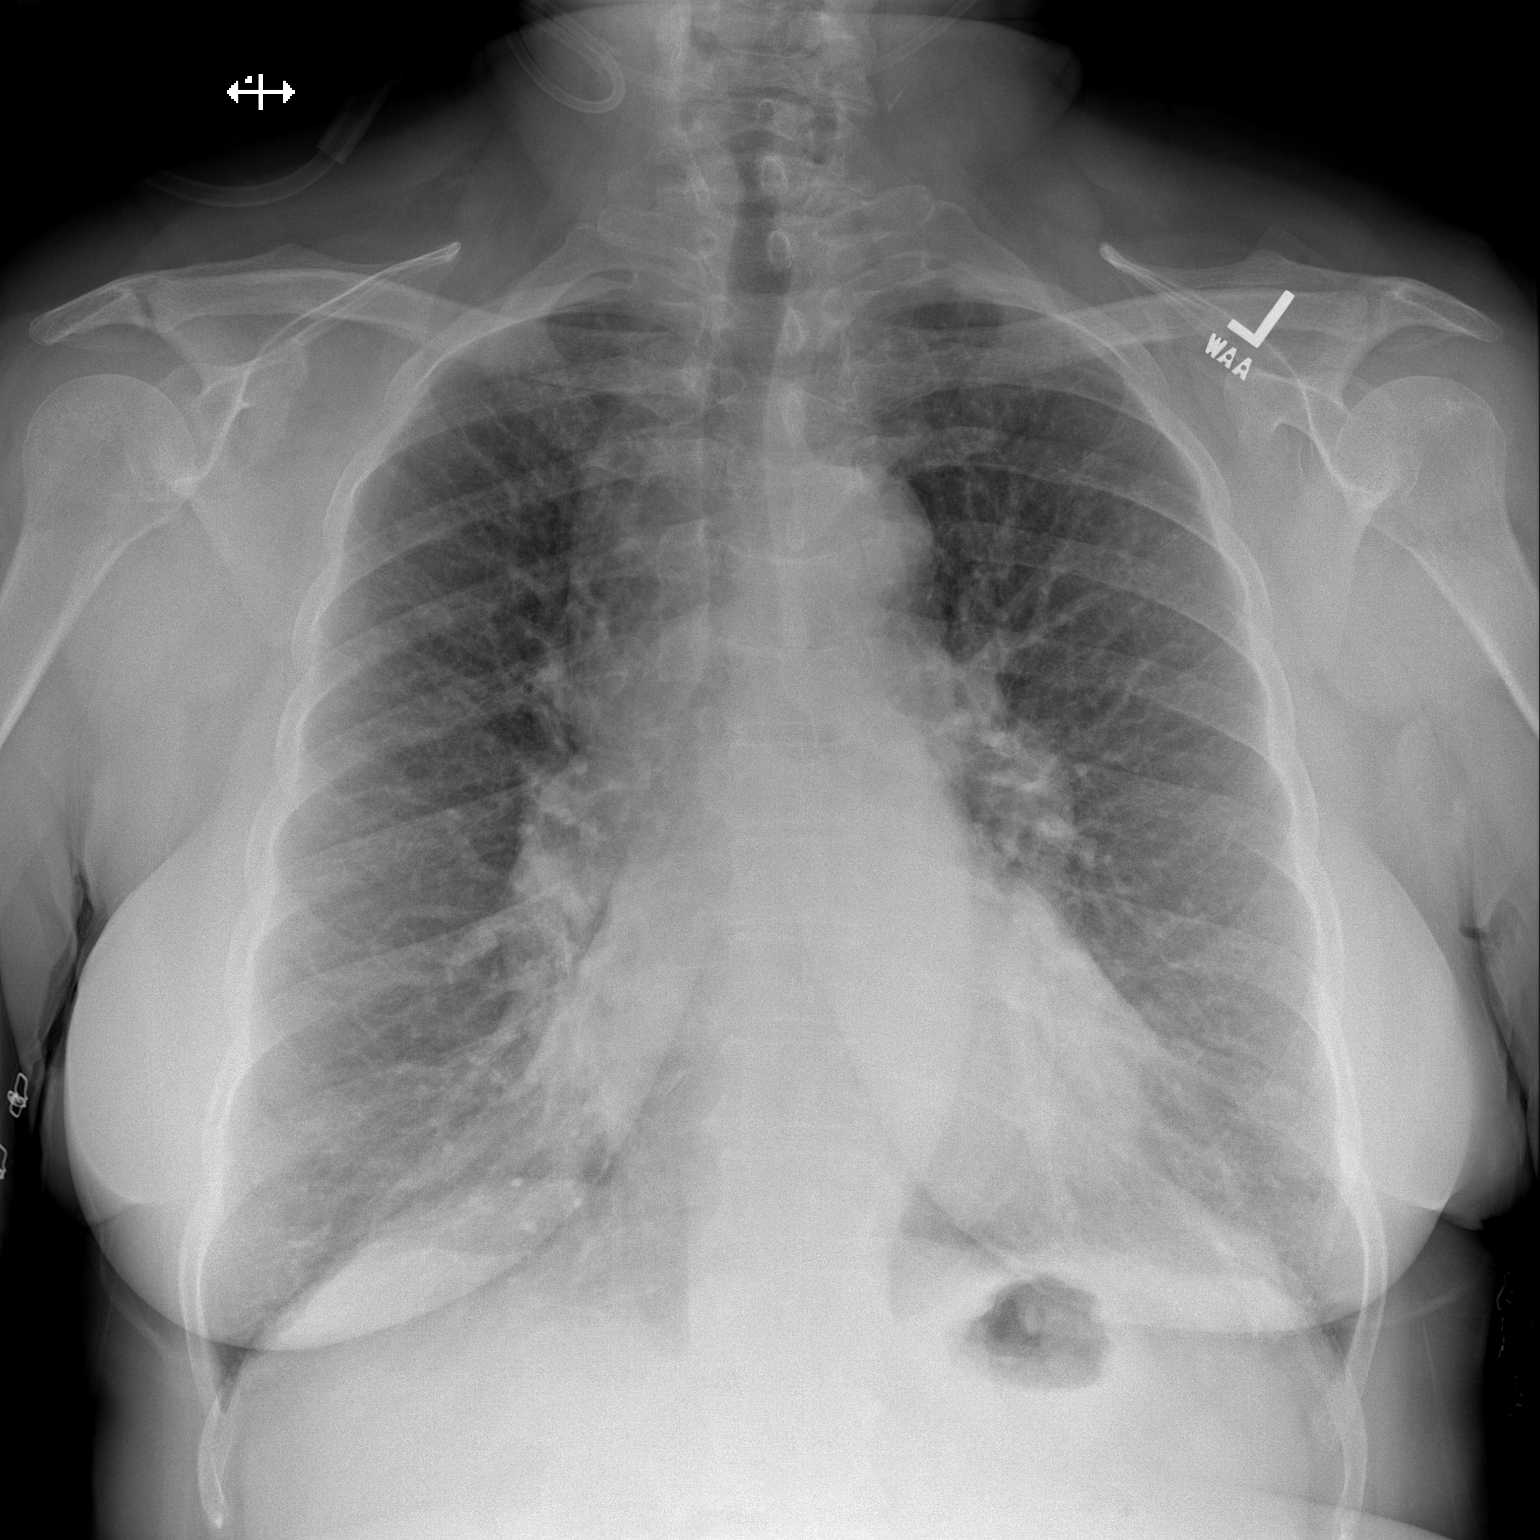
[im 2/2]
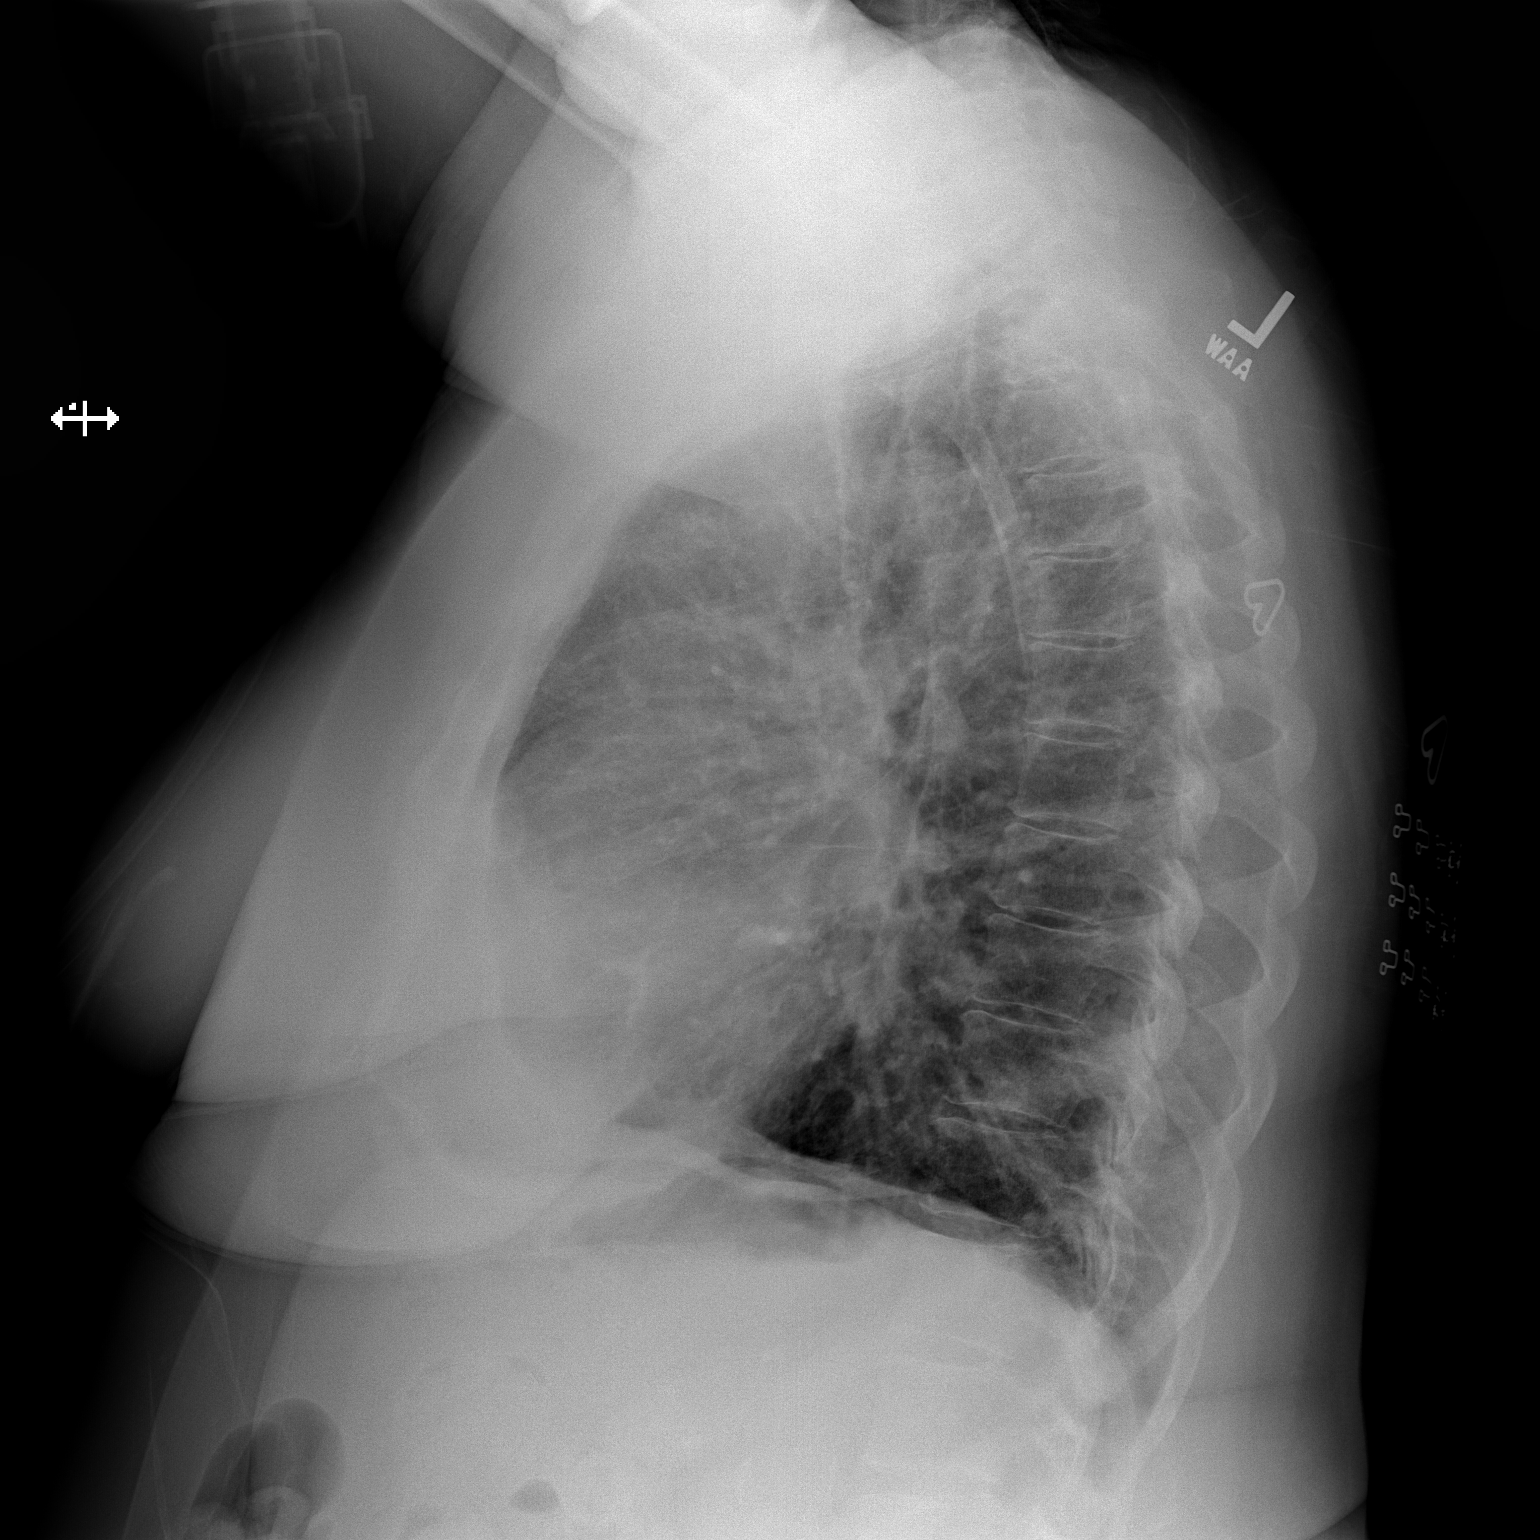

[2 of 2 positions shown; findings below may reference images not displayed]

PROCEDURE:     DXR - DXR CHEST PA (OR AP) AND LATERAL  - December 27, 2012 [DATE]

RESULT:     Comparison is made to the study May 14, 2012.

The lungs are hyperinflated with hemidiaphragm flattening. The interstitial
markings are increased bilaterally. The cardiac silhouette is top normal in
size. The pulmonary vascularity is not engorged. There is tortuosity of the
descending thoracic aorta. There is no pleural effusion or pneumothorax.
IMPRESSION: The findings are consistent with reactive airway disease
and likely superimposed acute bronchitis or early interstitial pneumonia.
There is no focal pneumonia nor overt evidence of pulmonary edema. Followup
films following therapy would be of value.

[REDACTED]

## 2013-07-14 IMAGING — CR DG CHEST 2V
1 series · 2 of 2 positions shown · non-contrast
Comparison: none

REASON FOR EXAM: SOB
COMMENTS:

[Series 3: w chest pa · 0.14mm/px · 2 of 2 slices shown]
[im 1/2]
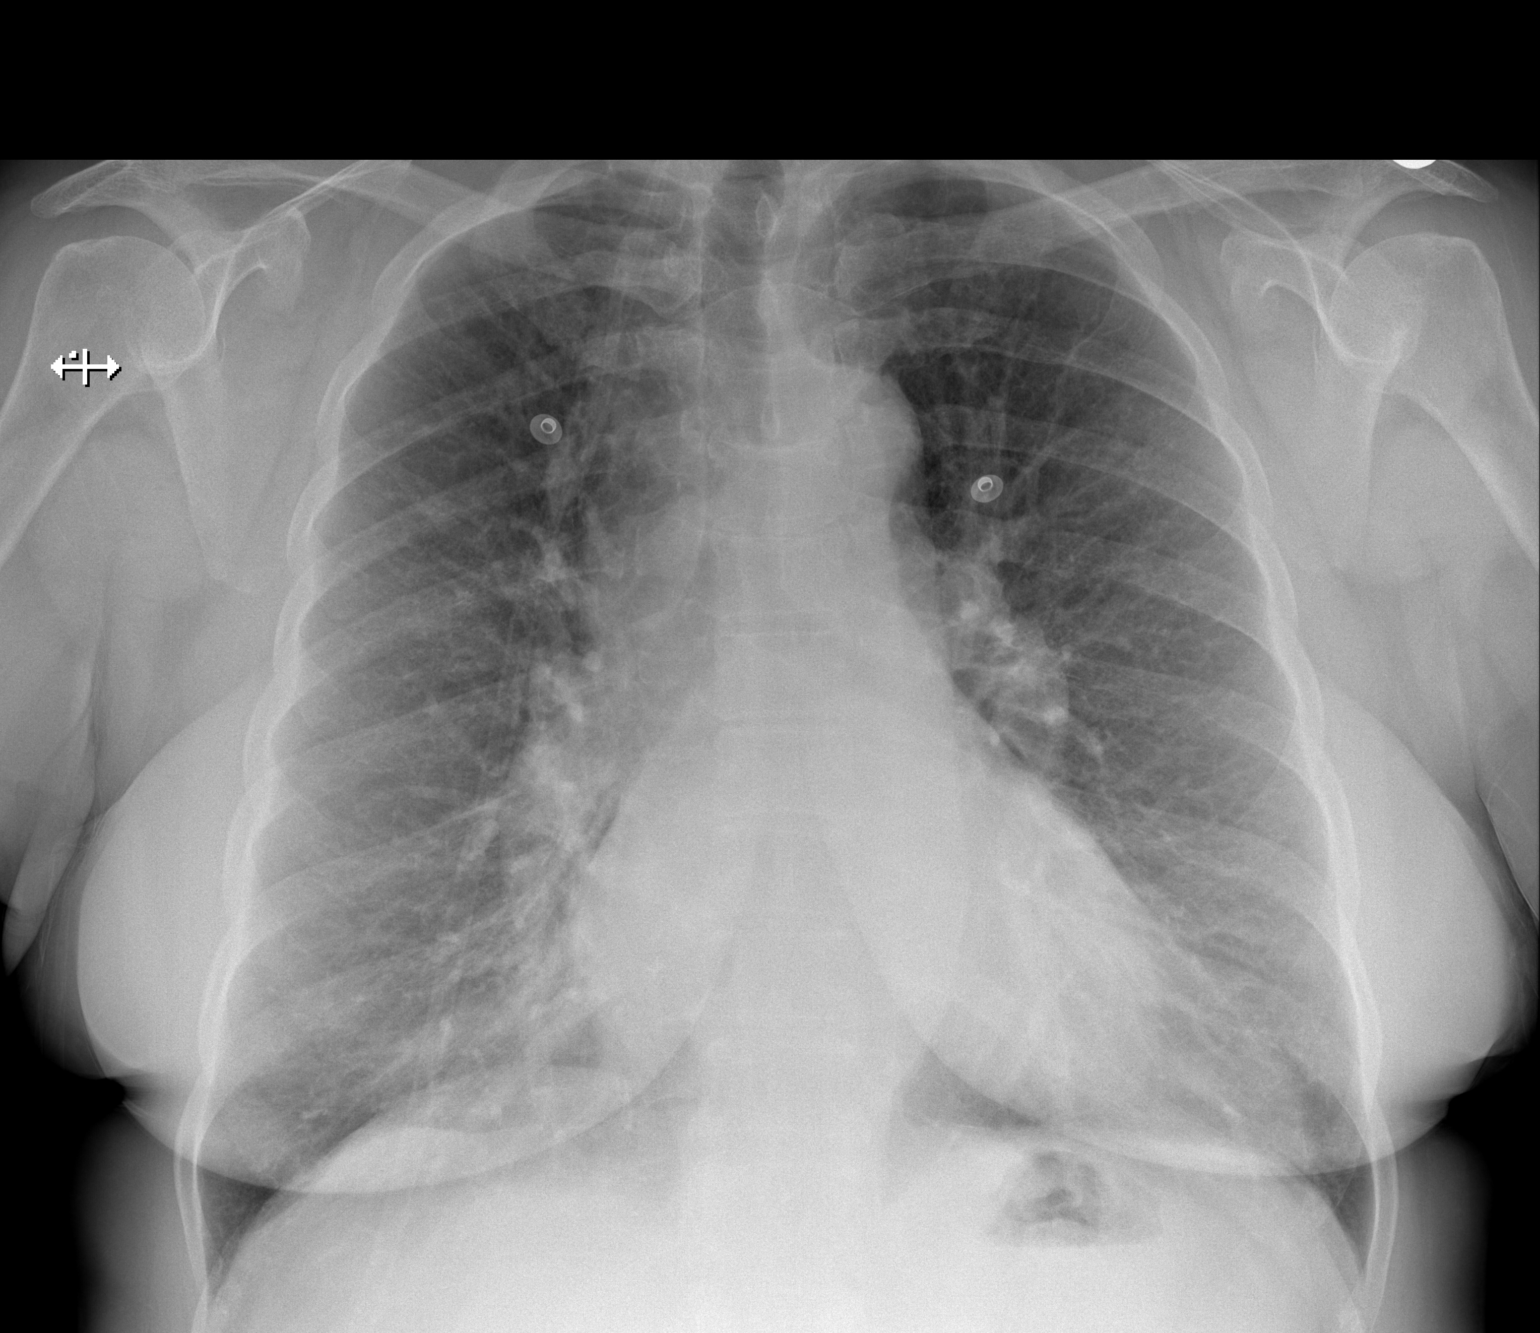
[im 2/2]
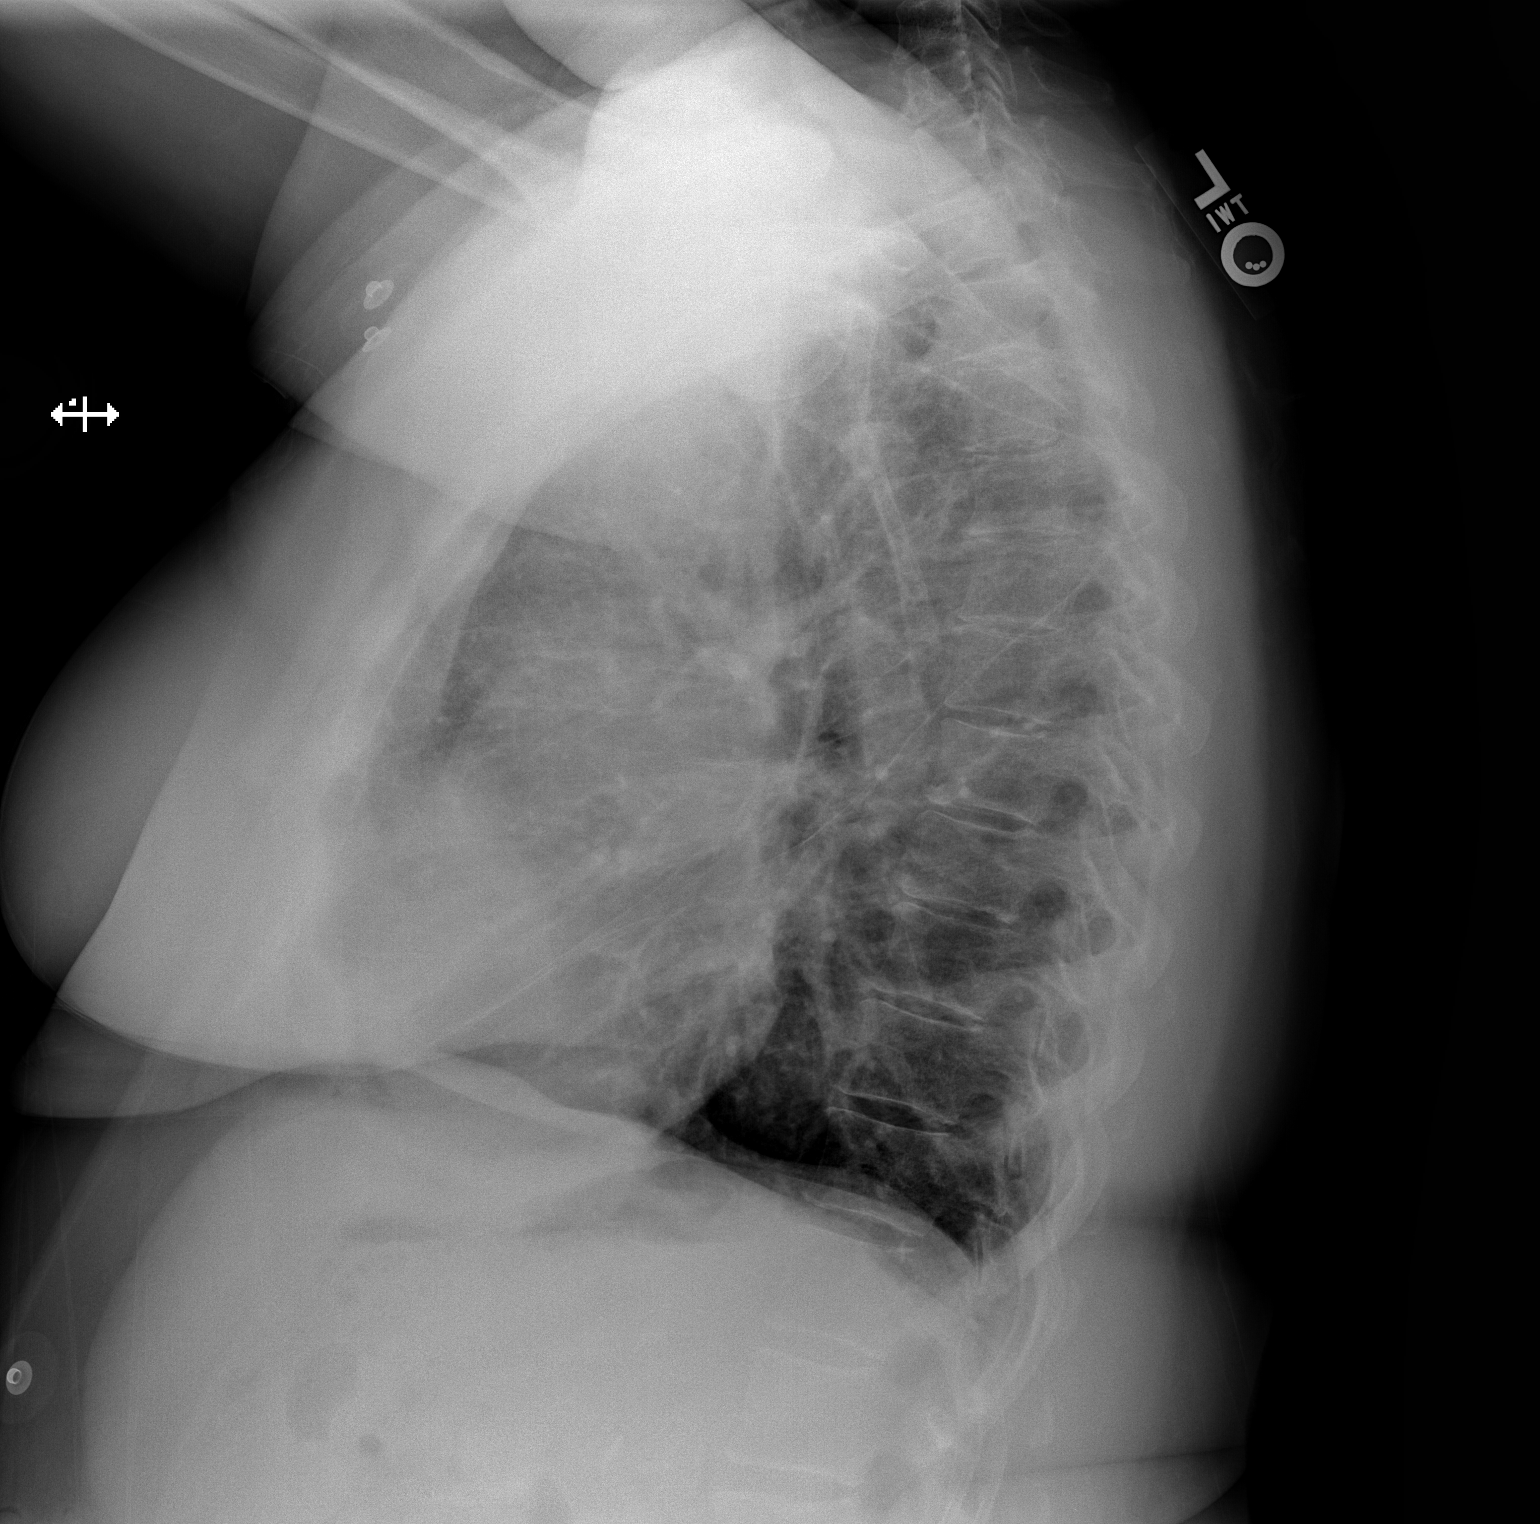

[2 of 2 positions shown; findings below may reference images not displayed]

PROCEDURE:     DXR - DXR CHEST PA (OR AP) AND LATERAL  - December 30, 2012  [DATE]

RESULT:     Comparison made to study December 27, 2012.

The lungs remain hyperinflated. The interstitial markings are mildly
increased but less conspicuous today. The cardiac silhouette is normal in
size. The central pulmonary vascularity remains prominent. There is no
pleural effusion or alveolar infiltrate.
IMPRESSION: 1. There has been slight interval improvement in the appearance of the
pulmonary interstitium.
2. There is persistent prominent of the pulmonary vascularity centrally.
There is no cephalization of the vascular pattern. This may reflect
underlying pulmonary hypertension.
3. There is no evidence of pneumonia.

[REDACTED]

## 2013-07-15 LAB — COMPREHENSIVE METABOLIC PANEL
Albumin: 3.4 g/dL (ref 3.4–5.0)
Alkaline Phosphatase: 110 U/L (ref 50–136)
Anion Gap: 4 — ABNORMAL LOW (ref 7–16)
BUN: 13 mg/dL (ref 7–18)
Bilirubin,Total: 0.2 mg/dL (ref 0.2–1.0)
Calcium, Total: 8.7 mg/dL (ref 8.5–10.1)
Chloride: 108 mmol/L — ABNORMAL HIGH (ref 98–107)
Co2: 28 mmol/L (ref 21–32)
Creatinine: 0.59 mg/dL — ABNORMAL LOW (ref 0.60–1.30)
EGFR (African American): >60
EGFR (Non-African Amer.): >60
Glucose: 99 mg/dL (ref 65–99)
Osmolality: 280 (ref 275–301)
Potassium: 4.3 mmol/L (ref 3.5–5.1)
SGOT(AST): 27 U/L (ref 15–37)
SGPT (ALT): 21 U/L (ref 12–78)
Sodium: 140 mmol/L (ref 136–145)
Total Protein: 6.8 g/dL (ref 6.4–8.2)

## 2013-07-15 LAB — CBC
HCT: 44.7 % (ref 35.0–47.0)
HGB: 15.4 g/dL (ref 12.0–16.0)
MCH: 33.2 pg (ref 26.0–34.0)
MCHC: 34.3 g/dL (ref 32.0–36.0)
MCV: 97 fL (ref 80–100)
Platelet: 169 10*3/uL (ref 150–440)
RBC: 4.62 10*6/uL (ref 3.80–5.20)
RDW: 13.7 % (ref 11.5–14.5)
WBC: 9.6 10*3/uL (ref 3.6–11.0)

## 2013-07-15 LAB — TROPONIN I: Troponin-I: <0.02 ng/mL

## 2013-07-17 ENCOUNTER — Inpatient Hospital Stay: Payer: Self-pay | Admitting: Internal Medicine

## 2013-07-20 LAB — CULTURE, BLOOD (SINGLE)

## 2013-07-30 ENCOUNTER — Encounter (HOSPITAL_COMMUNITY): Payer: Self-pay | Admitting: Emergency Medicine

## 2013-07-30 ENCOUNTER — Emergency Department (HOSPITAL_COMMUNITY)
Admission: EM | Admit: 2013-07-30 | Discharge: 2013-07-30 | Disposition: A | Discharge status: Home / Self Care | Payer: Self-pay | Attending: Emergency Medicine | Admitting: Emergency Medicine

## 2013-07-30 DIAGNOSIS — IMO0002 Reserved for concepts with insufficient information to code with codable children: Secondary | ICD-10-CM | POA: Insufficient documentation

## 2013-07-30 DIAGNOSIS — M25511 Pain in right shoulder: Secondary | ICD-10-CM

## 2013-07-30 DIAGNOSIS — I10 Essential (primary) hypertension: Secondary | ICD-10-CM | POA: Insufficient documentation

## 2013-07-30 DIAGNOSIS — J441 Chronic obstructive pulmonary disease with (acute) exacerbation: Secondary | ICD-10-CM | POA: Insufficient documentation

## 2013-07-30 DIAGNOSIS — F17200 Nicotine dependence, unspecified, uncomplicated: Secondary | ICD-10-CM | POA: Insufficient documentation

## 2013-07-30 DIAGNOSIS — Z79899 Other long term (current) drug therapy: Secondary | ICD-10-CM | POA: Insufficient documentation

## 2013-07-30 DIAGNOSIS — M25519 Pain in unspecified shoulder: Secondary | ICD-10-CM | POA: Insufficient documentation

## 2013-07-30 HISTORY — DX: Essential (primary) hypertension: I10

## 2013-07-30 HISTORY — DX: Chronic obstructive pulmonary disease, unspecified: J44.9

## 2013-07-30 LAB — BASIC METABOLIC PANEL
BUN: 16 mg/dL (ref 6–23)
CO2: 29 mEq/L (ref 19–32)
Calcium: 9.6 mg/dL (ref 8.4–10.5)
Chloride: 103 mEq/L (ref 96–112)
Creatinine, Ser: 0.72 mg/dL (ref 0.50–1.10)
GFR calc Af Amer: >90 mL/min (ref >90)
GFR calc non Af Amer: >90 mL/min (ref >90)
Glucose, Bld: 105 mg/dL — ABNORMAL HIGH (ref 70–99)
Potassium: 3.8 mEq/L (ref 3.5–5.1)
Sodium: 140 mEq/L (ref 135–145)

## 2013-07-30 LAB — CBC WITH DIFFERENTIAL/PLATELET
Basophils Absolute: 0 10*3/uL (ref 0.0–0.1)
Basophils Relative: 0 % (ref 0–1)
Eosinophils Absolute: 0.2 10*3/uL (ref 0.0–0.7)
Eosinophils Relative: 1 % (ref 0–5)
HCT: 46 % (ref 36.0–46.0)
Hemoglobin: 15.1 g/dL — ABNORMAL HIGH (ref 12.0–15.0)
Lymphocytes Relative: 19 % (ref 12–46)
Lymphs Abs: 2.2 10*3/uL (ref 0.7–4.0)
MCH: 33.5 pg (ref 26.0–34.0)
MCHC: 32.8 g/dL (ref 30.0–36.0)
MCV: 102 fL — ABNORMAL HIGH (ref 78.0–100.0)
Monocytes Absolute: 0.8 10*3/uL (ref 0.1–1.0)
Monocytes Relative: 7 % (ref 3–12)
Neutro Abs: 8.3 10*3/uL — ABNORMAL HIGH (ref 1.7–7.7)
Neutrophils Relative %: 72 % (ref 43–77)
Platelets: 192 10*3/uL (ref 150–400)
RBC: 4.51 MIL/uL (ref 3.87–5.11)
RDW: 14 % (ref 11.5–15.5)
WBC: 11.5 10*3/uL — ABNORMAL HIGH (ref 4.0–10.5)

## 2013-07-30 LAB — URINE MICROSCOPIC-ADD ON

## 2013-07-30 LAB — URINALYSIS, ROUTINE W REFLEX MICROSCOPIC
Bilirubin Urine: NEGATIVE
Glucose, UA: NEGATIVE mg/dL
Ketones, ur: NEGATIVE mg/dL
Leukocytes, UA: NEGATIVE
Nitrite: NEGATIVE
Protein, ur: NEGATIVE mg/dL
Specific Gravity, Urine: 1.015 (ref 1.005–1.030)
Urobilinogen, UA: 0.2 mg/dL (ref 0.0–1.0)
pH: 7.5 (ref 5.0–8.0)

## 2013-07-30 MED ORDER — OXYCODONE-ACETAMINOPHEN 5-325 MG PO TABS: 2.0000 | ORAL_TABLET | ORAL | Status: DC | PRN

## 2013-07-30 MED ORDER — CYCLOBENZAPRINE HCL 10 MG PO TABS: 10.0000 mg | ORAL_TABLET | Freq: Three times a day (TID) | ORAL | Status: DC | PRN

## 2013-07-30 MED ORDER — OXYCODONE-ACETAMINOPHEN 5-325 MG PO TABS
2.0000 | ORAL_TABLET | Freq: Once | ORAL | Status: AC
  Administered 2013-07-30: 2 via ORAL
  Filled 2013-07-30: qty 2

## 2013-07-30 MED ORDER — IBUPROFEN 800 MG PO TABS: 800.0000 mg | ORAL_TABLET | Freq: Three times a day (TID) | ORAL | Status: DC

## 2013-07-30 NOTE — ED Notes (Signed)
Pt c/o of pain in her upper back between her shoulder blades.  Pt has noticeable swelling in her upper back and on her shoulders bilateral.  Pt states she is on 2L O2 at home when O2 below 92%.  Pt O2 on assessment was 92% placed on 2L, on SOB.

## 2013-07-30 NOTE — ED Provider Notes (Signed)
CSN: [id number]     Arrival date & time 07/30/13  1113 History   First MD Initiated Contact with Patient 07/30/13 1300     Chief Complaint  Patient presents with  . Back Pain   (Consider location/radiation/quality/duration/timing/severity/associated sxs/prior Treatment) Patient is a 57 y.o. female presenting with back pain.  Back Pain  Pt with history of COPD reports one month of R shoulder pain, comes and goes, worse in the last couple of days. Not improved with Motrin/APAP at home. Some worse with movement. She was recently admitted to [facility] for COPD exacerbation, mentioned this pain to them, but was not evaluated for same. Had CXR done while she was there, states they told her it 'showed something' but doesn't know what. Has not seen PCP for this but is scheduled for followup in 10 days. Pt reports 'swelling' in supraclavicular areas. Denies radicular symptoms.   Past Medical History  Diagnosis Date  . COPD (chronic obstructive pulmonary disease)   . Hypertension    Past Surgical History  Procedure Laterality Date  . Tubal ligation    . Abdominal hysterectomy     History reviewed. No pertinent family history. History  Substance Use Topics  . Smoking status: Current Every Day Smoker  . Smokeless tobacco: Not on file  . Alcohol Use: No   OB History   Grav Para Term Preterm Abortions TAB SAB Ect Mult Living                 Review of Systems  Musculoskeletal: Positive for back pain.   All other systems reviewed and are negative except as noted in HPI.   Allergies  Codeine  Home Medications   Current Outpatient Rx  Name  Route  Sig  Dispense  Refill  . acetaminophen (TYLENOL) 500 MG tablet   Oral   Take 1,000 mg by mouth every 6 (six) hours as needed for pain.         Marland Kitchen albuterol (PROVENTIL HFA;VENTOLIN HFA) 108 (90 BASE) MCG/ACT inhaler   Inhalation   Inhale 2 puffs into the lungs every 6 (six) hours as needed for wheezing.         .  bisoprolol-hydrochlorothiazide (ZIAC) 10-6.25 MG per tablet   Oral   Take 1 tablet by mouth daily.         . Fluticasone-Salmeterol (ADVAIR) 250-50 MCG/DOSE AEPB   Inhalation   Inhale 1 puff into the lungs every 12 (twelve) hours.         Marland Kitchen ibuprofen (ADVIL,MOTRIN) 200 MG tablet   Oral   Take 400 mg by mouth every 6 (six) hours as needed for pain.         Marland Kitchen sertraline (ZOLOFT) 100 MG tablet   Oral   Take 100 mg by mouth daily.         Marland Kitchen tiotropium (SPIRIVA) 18 MCG inhalation capsule   Inhalation   Place 18 mcg into inhaler and inhale daily.          BP 110/71  Pulse 79  Temp(Src) 98 F (36.7 C) (Oral)  Resp 19  Ht _0  (1.6 m)  Wt 220 lb (99.791 kg)  BMI 38.98 kg/m2  SpO2 96% Physical Exam  Nursing note and vitals reviewed. Constitutional: She is oriented to person, place, and time. She appears well-developed and well-nourished.  HENT:  Head: Normocephalic and atraumatic.  Eyes: EOM are normal. Pupils are equal, round, and reactive to light.  Neck: Normal range of motion. Neck supple.  Cardiovascular: Normal rate, normal heart sounds and intact distal pulses.   Pulmonary/Chest: Effort normal and breath sounds normal.  Abdominal: Bowel sounds are normal. She exhibits no distension. There is no tenderness.  Musculoskeletal: Normal range of motion. She exhibits tenderness (tender over the R trapezius muscle, no bony tenderness). She exhibits no edema.  Neurological: She is alert and oriented to person, place, and time. She has normal strength. No cranial nerve deficit or sensory deficit.  Skin: Skin is warm and dry. No rash noted.  Psychiatric: She has a normal mood and affect.    ED Course  Procedures (including critical care time) Labs Review Labs Reviewed  CBC WITH DIFFERENTIAL - Abnormal; Notable for the following:    WBC 11.5 (*)    Hemoglobin 15.1 (*)    MCV 102.0 (*)    Neutro Abs 8.3 (*)    All other components within normal limits  BASIC  METABOLIC PANEL - Abnormal; Notable for the following:    Glucose, Bld 105 (*)    All other components within normal limits  URINALYSIS, ROUTINE W REFLEX MICROSCOPIC   Imaging Review No results found.  EKG Interpretation     Ventricular Rate:  81 PR Interval:  158 QRS Duration: 82 QT Interval:  390 QTC Calculation: 453 R Axis:   3 Text Interpretation:  Sinus rhythm with Premature atrial complexes Septal infarct , age undetermined Abnormal ECG No previous ECGs available            MDM   1. Shoulder pain, right     Pt with MSK pain, no concern for bony or intrathoracic process. No significant swelling in supraclavicular areas, there is some redundant soft tissue bilaterally; no facial swelling to suggest SVC syndrome in this patient also. Pain medication and advised close PCP follow up for recheck including additional imaging if necessary.      Charles B. Karle Starch, MD 07/30/13 1313

## 2013-07-30 NOTE — ED Notes (Signed)
Pain t- spine region for over 1 month. Recent adm to [facility] for Copd. This month.  Pain upper back was not evaluated.  Alert, Seems sob.Pain is burning pain but not related to movement No known injury  Pt had sweats pta

## 2013-10-17 ENCOUNTER — Encounter (HOSPITAL_COMMUNITY): Payer: Self-pay | Admitting: Emergency Medicine

## 2013-10-17 ENCOUNTER — Emergency Department (HOSPITAL_COMMUNITY)
Admission: EM | Admit: 2013-10-17 | Discharge: 2013-10-17 | Disposition: A | Discharge status: Home / Self Care | Payer: Self-pay | Attending: Emergency Medicine | Admitting: Emergency Medicine

## 2013-10-17 ENCOUNTER — Emergency Department (HOSPITAL_COMMUNITY): Payer: Self-pay

## 2013-10-17 DIAGNOSIS — S59909A Unspecified injury of unspecified elbow, initial encounter: Secondary | ICD-10-CM | POA: Insufficient documentation

## 2013-10-17 DIAGNOSIS — S6990XA Unspecified injury of unspecified wrist, hand and finger(s), initial encounter: Secondary | ICD-10-CM | POA: Insufficient documentation

## 2013-10-17 DIAGNOSIS — Y929 Unspecified place or not applicable: Secondary | ICD-10-CM | POA: Insufficient documentation

## 2013-10-17 DIAGNOSIS — IMO0002 Reserved for concepts with insufficient information to code with codable children: Secondary | ICD-10-CM | POA: Insufficient documentation

## 2013-10-17 DIAGNOSIS — J111 Influenza due to unidentified influenza virus with other respiratory manifestations: Secondary | ICD-10-CM | POA: Insufficient documentation

## 2013-10-17 DIAGNOSIS — S59919A Unspecified injury of unspecified forearm, initial encounter: Secondary | ICD-10-CM

## 2013-10-17 DIAGNOSIS — R6889 Other general symptoms and signs: Secondary | ICD-10-CM

## 2013-10-17 DIAGNOSIS — X500XXA Overexertion from strenuous movement or load, initial encounter: Secondary | ICD-10-CM | POA: Insufficient documentation

## 2013-10-17 DIAGNOSIS — Y939 Activity, unspecified: Secondary | ICD-10-CM | POA: Insufficient documentation

## 2013-10-17 DIAGNOSIS — I10 Essential (primary) hypertension: Secondary | ICD-10-CM | POA: Insufficient documentation

## 2013-10-17 DIAGNOSIS — Z79899 Other long term (current) drug therapy: Secondary | ICD-10-CM | POA: Insufficient documentation

## 2013-10-17 DIAGNOSIS — Z9981 Dependence on supplemental oxygen: Secondary | ICD-10-CM | POA: Insufficient documentation

## 2013-10-17 DIAGNOSIS — W010XXA Fall on same level from slipping, tripping and stumbling without subsequent striking against object, initial encounter: Secondary | ICD-10-CM | POA: Insufficient documentation

## 2013-10-17 DIAGNOSIS — J441 Chronic obstructive pulmonary disease with (acute) exacerbation: Secondary | ICD-10-CM | POA: Insufficient documentation

## 2013-10-17 DIAGNOSIS — F17200 Nicotine dependence, unspecified, uncomplicated: Secondary | ICD-10-CM | POA: Insufficient documentation

## 2013-10-17 DIAGNOSIS — M25531 Pain in right wrist: Secondary | ICD-10-CM

## 2013-10-17 DIAGNOSIS — W19XXXA Unspecified fall, initial encounter: Secondary | ICD-10-CM

## 2013-10-17 LAB — BASIC METABOLIC PANEL
BUN: 18 mg/dL (ref 6–23)
CO2: 29 mEq/L (ref 19–32)
Calcium: 9.2 mg/dL (ref 8.4–10.5)
Chloride: 102 mEq/L (ref 96–112)
Creatinine, Ser: 0.89 mg/dL (ref 0.50–1.10)
GFR calc Af Amer: 82 mL/min — ABNORMAL LOW (ref >90)
GFR calc non Af Amer: 71 mL/min — ABNORMAL LOW (ref >90)
Glucose, Bld: 103 mg/dL — ABNORMAL HIGH (ref 70–99)
Potassium: 4.1 mEq/L (ref 3.7–5.3)
Sodium: 143 mEq/L (ref 137–147)

## 2013-10-17 LAB — CBC WITH DIFFERENTIAL/PLATELET
Basophils Absolute: 0 10*3/uL (ref 0.0–0.1)
Basophils Relative: 0 % (ref 0–1)
Eosinophils Absolute: 0.3 10*3/uL (ref 0.0–0.7)
Eosinophils Relative: 3 % (ref 0–5)
HCT: 46.8 % — ABNORMAL HIGH (ref 36.0–46.0)
Hemoglobin: 15 g/dL (ref 12.0–15.0)
Lymphocytes Relative: 25 % (ref 12–46)
Lymphs Abs: 2.5 10*3/uL (ref 0.7–4.0)
MCH: 33 pg (ref 26.0–34.0)
MCHC: 32.1 g/dL (ref 30.0–36.0)
MCV: 102.9 fL — ABNORMAL HIGH (ref 78.0–100.0)
Monocytes Absolute: 0.8 10*3/uL (ref 0.1–1.0)
Monocytes Relative: 8 % (ref 3–12)
Neutro Abs: 6.6 10*3/uL (ref 1.7–7.7)
Neutrophils Relative %: 64 % (ref 43–77)
Platelets: 180 10*3/uL (ref 150–400)
RBC: 4.55 MIL/uL (ref 3.87–5.11)
RDW: 13.8 % (ref 11.5–15.5)
WBC: 10.2 10*3/uL (ref 4.0–10.5)

## 2013-10-17 MED ORDER — HYDROCODONE-ACETAMINOPHEN 5-325 MG PO TABS
2.0000 | ORAL_TABLET | Freq: Once | ORAL | Status: AC
  Administered 2013-10-17: 2 via ORAL
  Filled 2013-10-17: qty 2

## 2013-10-17 MED ORDER — IPRATROPIUM-ALBUTEROL 0.5-2.5 (3) MG/3ML IN SOLN
3.0000 mL | Freq: Once | RESPIRATORY_TRACT | Status: AC
  Administered 2013-10-17: 3 mL via RESPIRATORY_TRACT
  Filled 2013-10-17: qty 3

## 2013-10-17 MED ORDER — METHYLPREDNISOLONE SODIUM SUCC 125 MG IJ SOLR
80.0000 mg | Freq: Once | INTRAMUSCULAR | Status: AC
  Administered 2013-10-17: 80 mg via INTRAVENOUS
  Filled 2013-10-17: qty 2

## 2013-10-17 MED ORDER — IPRATROPIUM-ALBUTEROL 0.5-2.5 (3) MG/3ML IN SOLN: 6.0000 mL | Freq: Once | RESPIRATORY_TRACT | Status: DC

## 2013-10-17 MED ORDER — ALBUTEROL SULFATE (2.5 MG/3ML) 0.083% IN NEBU
5.0000 mg | INHALATION_SOLUTION | Freq: Once | RESPIRATORY_TRACT | Status: AC
  Administered 2013-10-17: 5 mg via RESPIRATORY_TRACT
  Filled 2013-10-17: qty 6

## 2013-10-17 MED ORDER — PREDNISONE 20 MG PO TABS: ORAL_TABLET | ORAL | Status: DC

## 2013-10-17 MED ORDER — IPRATROPIUM BROMIDE 0.02 % IN SOLN
0.5000 mg | Freq: Once | RESPIRATORY_TRACT | Status: AC
  Administered 2013-10-17: 0.5 mg via RESPIRATORY_TRACT
  Filled 2013-10-17: qty 2.5

## 2013-10-17 MED ORDER — IBUPROFEN 400 MG PO TABS
600.0000 mg | ORAL_TABLET | Freq: Once | ORAL | Status: AC
  Administered 2013-10-17: 600 mg via ORAL
  Filled 2013-10-17: qty 2

## 2013-10-17 NOTE — ED Notes (Signed)
Fall, hit right arm per pt. Having shortness of breath and wheezing at triage. Patient states that she is on oxygen but not all the time

## 2013-10-17 NOTE — Discharge Instructions (Signed)
If you were given medicines take as directed.  If you are on coumadin or contraceptives realize their levels and effectiveness is altered by many different medicines.  If you have any reaction (rash, tongues swelling, other) to the medicines stop taking and see a physician.   Please follow up as directed and return to the ER or see a physician for new or worsening symptoms.  Thank you. Use ice and ibuprofen/ tylenol for pain. See ortho if no improvement in 3 days.

## 2013-10-17 NOTE — ED Notes (Signed)
Pt alert & oriented x4, stable gait. Patient given discharge instructions, paperwork & prescription(s). Patient  instructed to stop at the registration desk to finish any additional paperwork. Patient verbalized understanding. Pt left department w/ no further questions. 

## 2013-10-17 NOTE — ED Provider Notes (Signed)
CSN: [id number]     Arrival date & time 10/17/13  1950 History   First MD Initiated Contact with Patient 10/17/13 2002     Chief Complaint  Patient presents with  . Fall  . Shortness of Breath   (Consider location/radiation/quality/duration/timing/severity/associated sxs/prior Treatment) HPI Comments: 58 yo female with copd, on 2.5 L home O2 as needed presents with fall and wheezing. Pt has had gradually worsening breathing for a few days, has nebs at home. No current steroids or abx. She slipped on the step and twisted and hit right distal arm. No cp, loc or head injury.  Mild cough, no fever. Similar to previous copd.    Patient is a 58 y.o. female presenting with fall and shortness of breath. The history is provided by the patient.  Fall Associated symptoms include shortness of breath. Pertinent negatives include no chest pain, no abdominal pain and no headaches.  Shortness of Breath Associated symptoms: cough   Associated symptoms: no abdominal pain, no chest pain, no fever, no headaches, no neck pain, no rash and no vomiting     Past Medical History  Diagnosis Date  . COPD (chronic obstructive pulmonary disease)   . Hypertension    Past Surgical History  Procedure Laterality Date  . Tubal ligation    . Abdominal hysterectomy     History reviewed. No pertinent family history. History  Substance Use Topics  . Smoking status: Current Every Day Smoker  . Smokeless tobacco: Not on file  . Alcohol Use: No   OB History   Grav Para Term Preterm Abortions TAB SAB Ect Mult Living                 Review of Systems  Constitutional: Negative for fever and chills.  HENT: Negative for congestion.   Eyes: Negative for visual disturbance.  Respiratory: Positive for cough and shortness of breath.   Cardiovascular: Negative for chest pain.  Gastrointestinal: Negative for vomiting and abdominal pain.  Genitourinary: Negative for dysuria and flank pain.  Musculoskeletal: Positive for  arthralgias. Negative for back pain, neck pain and neck stiffness.  Skin: Negative for rash.  Neurological: Negative for light-headedness, numbness and headaches.    Allergies  Codeine  Home Medications   Current Outpatient Rx  Name  Route  Sig  Dispense  Refill  . albuterol (PROVENTIL HFA;VENTOLIN HFA) 108 (90 BASE) MCG/ACT inhaler   Inhalation   Inhale 2 puffs into the lungs every 6 (six) hours as needed for wheezing.         . Aspirin-Caffeine (BC FAST PAIN RELIEF ARTHRITIS) 1000-65 MG PACK   Oral   Take 1 packet by mouth every 6 (six) hours as needed (Pain).         . bisoprolol-hydrochlorothiazide (ZIAC) 10-6.25 MG per tablet   Oral   Take 1 tablet by mouth daily.         . Fluticasone-Salmeterol (ADVAIR) 250-50 MCG/DOSE AEPB   Inhalation   Inhale 1 puff into the lungs every 12 (twelve) hours.         . sertraline (ZOLOFT) 100 MG tablet   Oral   Take 100 mg by mouth daily.         Marland Kitchen tiotropium (SPIRIVA) 18 MCG inhalation capsule   Inhalation   Place 18 mcg into inhaler and inhale daily.         . predniSONE (DELTASONE) 20 MG tablet      2 tabs po daily x 4 days  8 tablet   0    BP 145/85  Pulse 79  Temp(Src) 98.3 F (36.8 C) (Oral)  Resp 22  Ht _0  (1.6 m)  Wt 225 lb (102.059 kg)  BMI 39.87 kg/m2  SpO2 99% Physical Exam  Nursing note and vitals reviewed. Constitutional: She is oriented to person, place, and time. She appears well-developed and well-nourished.  HENT:  Head: Normocephalic and atraumatic.  Eyes: Conjunctivae are normal. Right eye exhibits no discharge. Left eye exhibits no discharge.  Neck: Normal range of motion. Neck supple. No tracheal deviation present.  Cardiovascular: Normal rate and regular rhythm.   Pulmonary/Chest: Effort normal. She has wheezes. She has no rales.  Abdominal: Soft. She exhibits no distension. There is no tenderness. There is no guarding.  Musculoskeletal: She exhibits edema and tenderness.   Distal radius mild tender/ swelling, pain with flexion, no significant snuff box tender, no elbow pain or swelling, nv intact right arm  Neurological: She is alert and oriented to person, place, and time.  Skin: Skin is warm. No rash noted.  Psychiatric: She has a normal mood and affect.    ED Course  Procedures (including critical care time) Labs Review Labs Reviewed  CBC WITH DIFFERENTIAL - Abnormal; Notable for the following:    HCT 46.8 (*)    MCV 102.9 (*)    All other components within normal limits  BASIC METABOLIC PANEL - Abnormal; Notable for the following:    Glucose, Bld 103 (*)    GFR calc non Af Amer 71 (*)    GFR calc Af Amer 82 (*)    All other components within normal limits   Imaging Review Dg Chest 2 View  10/17/2013   CLINICAL DATA:  Fall down stairs. Tobacco use. Shortness of breath.  EXAM: CHEST  2 VIEW  COMPARISON:  None.  FINDINGS: No pneumothorax or pulmonary contusion. Subsegmental atelectasis noted medially at the right lung base. Lungs appear otherwise clear. Mild bilateral hilar prominence is probably vascular.  Large lung volumes favor emphysema.  Mild thoracic spondylosis.  IMPRESSION: 1. Suspected emphysema. 2. Subsegmental atelectasis medially at the right lung base. 3. No pneumothorax or pleural effusion.   Electronically Signed   By: Sherryl Barters M.D.   On: 10/17/2013 21:10   Dg Wrist Complete Right  10/17/2013   CLINICAL DATA:  Fall down stairs. Laceration and bruising along the right wrist.  EXAM: RIGHT WRIST - COMPLETE 3+ VIEW  COMPARISON:  None.  FINDINGS: Degenerative spurring of the 1st carpometacarpal articulation. Scalloping along the ulnar side of the lunate suggesting small erosion.  No fracture is observed. Subcutaneous stranding along the lateral distal forearm may reflect bruising.  IMPRESSION: 1. No acute bony findings. 2. Suspected erosion along the ulnar side of the lunate, query erosive arthropathy. 3. Mild degenerative findings at the  1st carpometacarpal articulation.   Electronically Signed   By: Sherryl Barters M.D.   On: 10/17/2013 21:12    EKG Interpretation   None       MDM   1. Flu-like symptoms   2. Acute exacerbation of chronic obstructive pulmonary disease (COPD)   3. Wrist pain, right   4. Fall, initial encounter    Xray no fx, bone contusion.  Pain meds and fup with ortho if no improvement.  Acute copd exac.  Two nebs in ED, pt improved, on normal 2.5 L and saturating 99%. Improved on recheck, breathing comfortably.   Results and differential diagnosis were discussed with the patient.  Close follow up outpatient was discussed, patient comfortable with the plan.   Diagnosis: above    Mariea Clonts, MD 10/17/13 [id number]

## 2013-10-21 IMAGING — CR DG FOOT COMPLETE 3+V*L*
1 series · 3 of 3 positions shown · non-contrast
Comparison: none

REASON FOR EXAM: injury
COMMENTS:

PROCEDURE:     DXR - DXR FOOT LT COMP W/OBLIQUES  - April 08, 2013  [DATE]
RESULT:     Left foot images demonstrate no fracture, dislocation or
radiopaque foreign body. There is prominent degenerative spurring in the
plantar region of the calcaneus.

[Series 1: ap · 0.17mm/px · 3 of 3 slices shown]
[im 1/3]
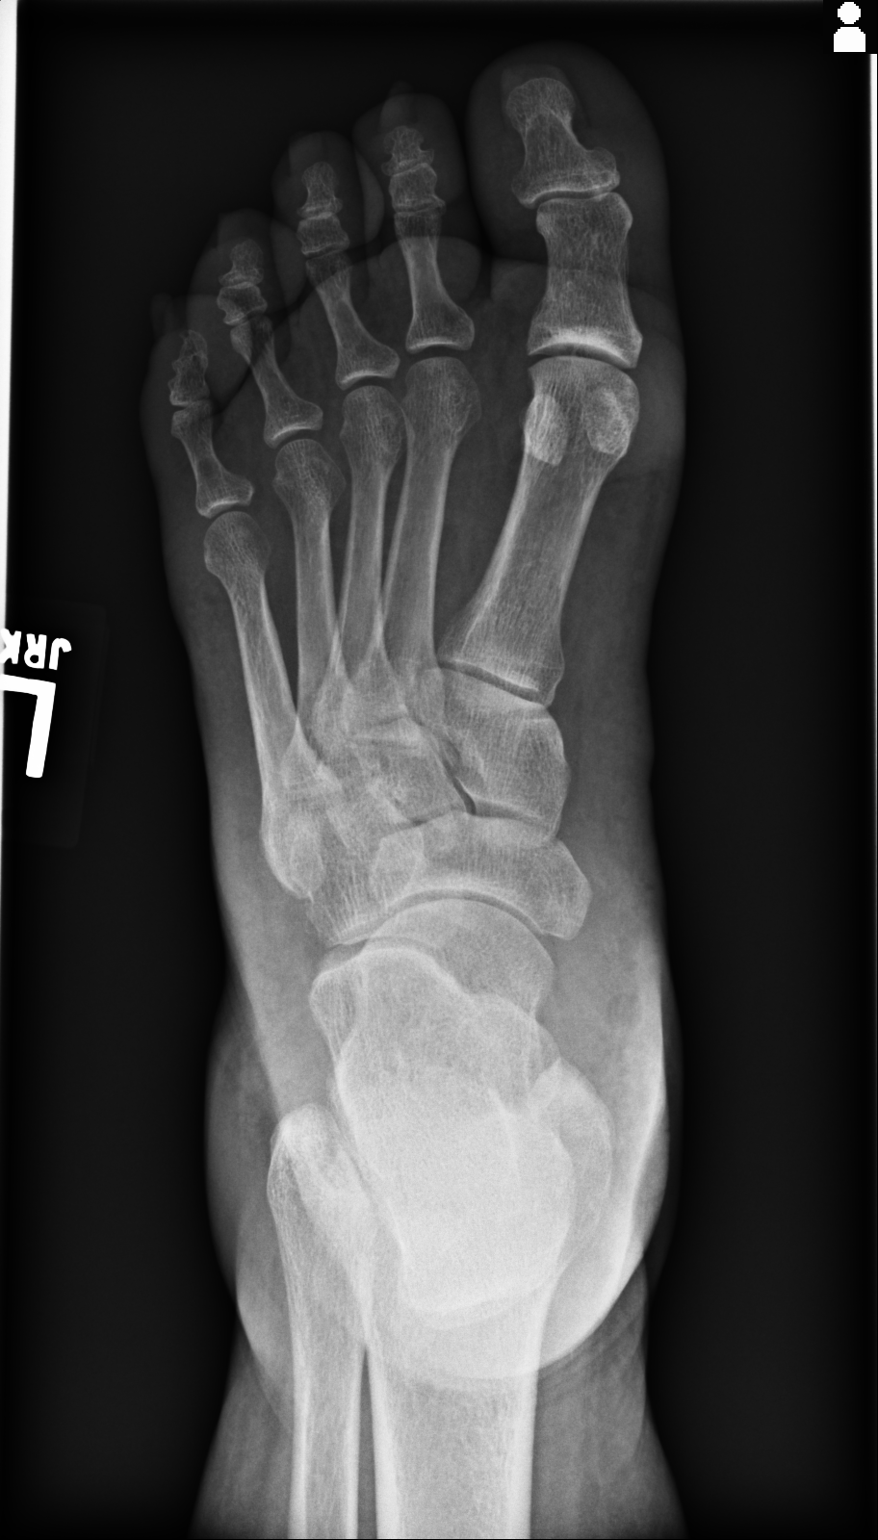
[im 2/3]
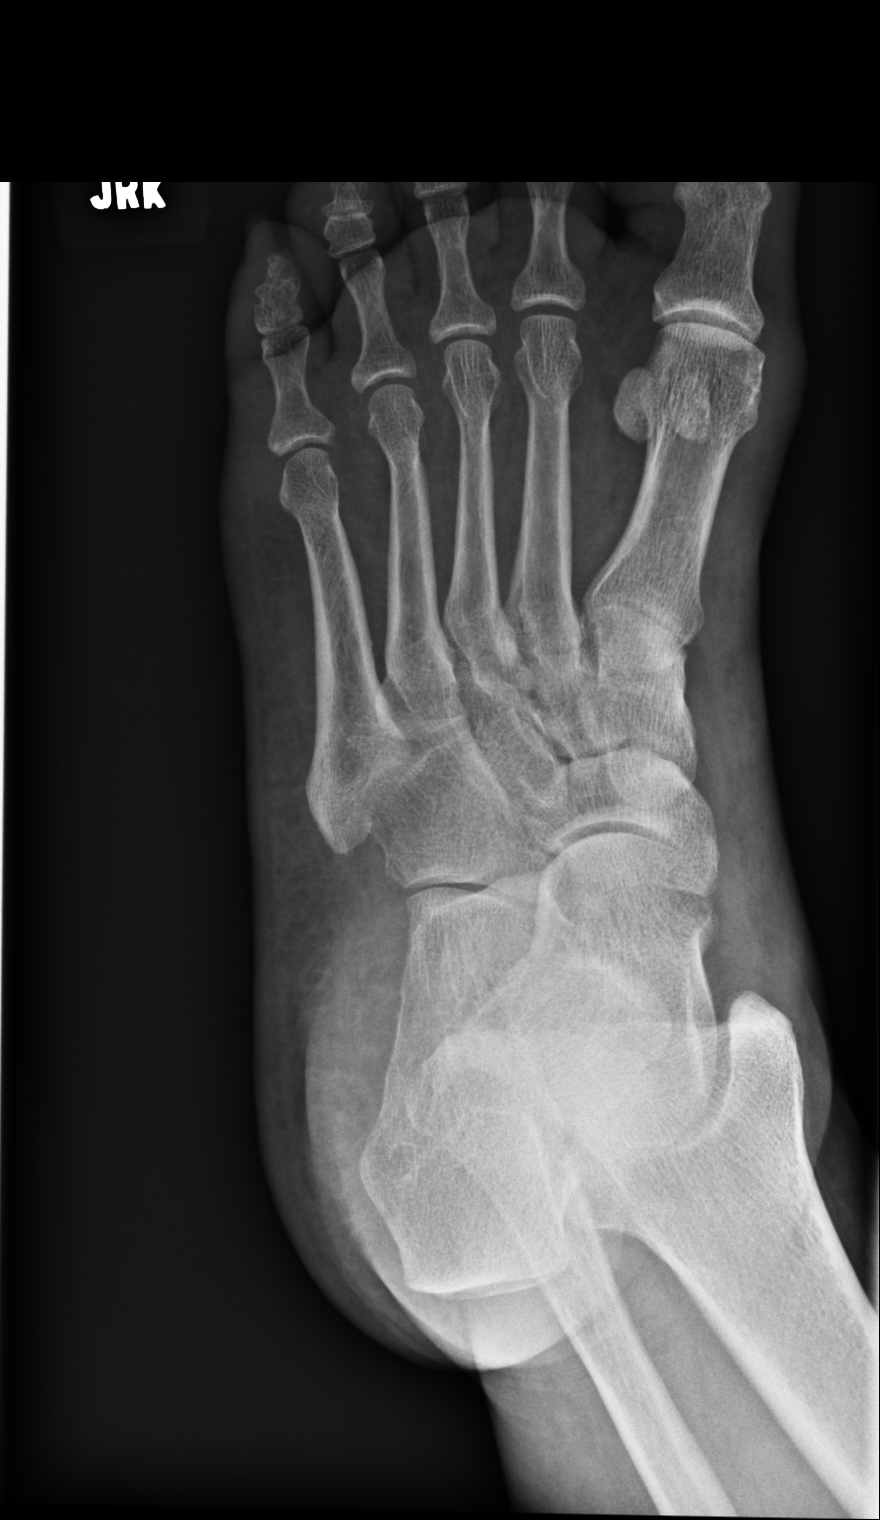
[im 3/3]
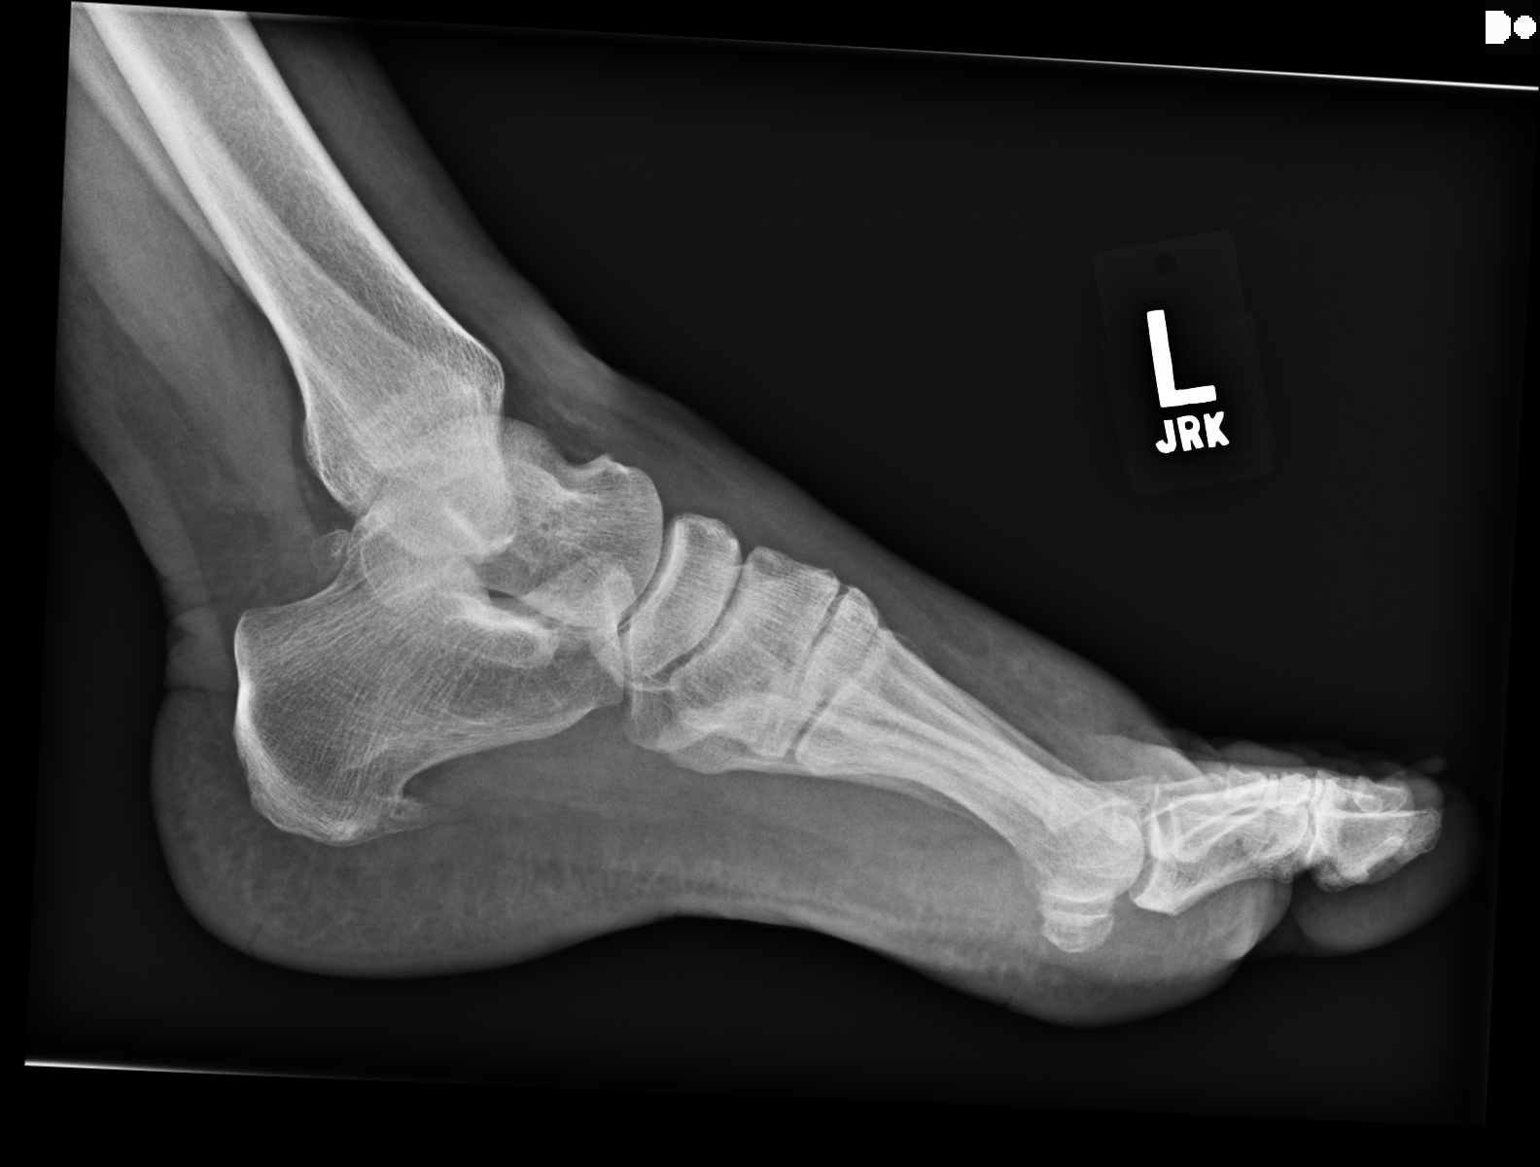

[3 of 3 positions shown; findings below may reference images not displayed]

IMPRESSION: Please see above.

[REDACTED]

## 2013-10-21 IMAGING — CR DG ANKLE COMPLETE 3+V*L*
1 series · 5 of 5 positions shown · non-contrast
Comparison: none

REASON FOR EXAM: injury
COMMENTS:

PROCEDURE:     DXR - DXR ANKLE LEFT COMPLETE  - April 08, 2013  [DATE]
RESULT:     Left ankle images demonstrate soft tissue swelling especially
laterally. There is no fracture, dislocation or radiopaque foreign body
demonstrated. Degenerative plantar spurring is seen the calcaneus.

[Series 1: ap · 0.17mm/px · 5 of 5 slices shown]
[im 1/5]
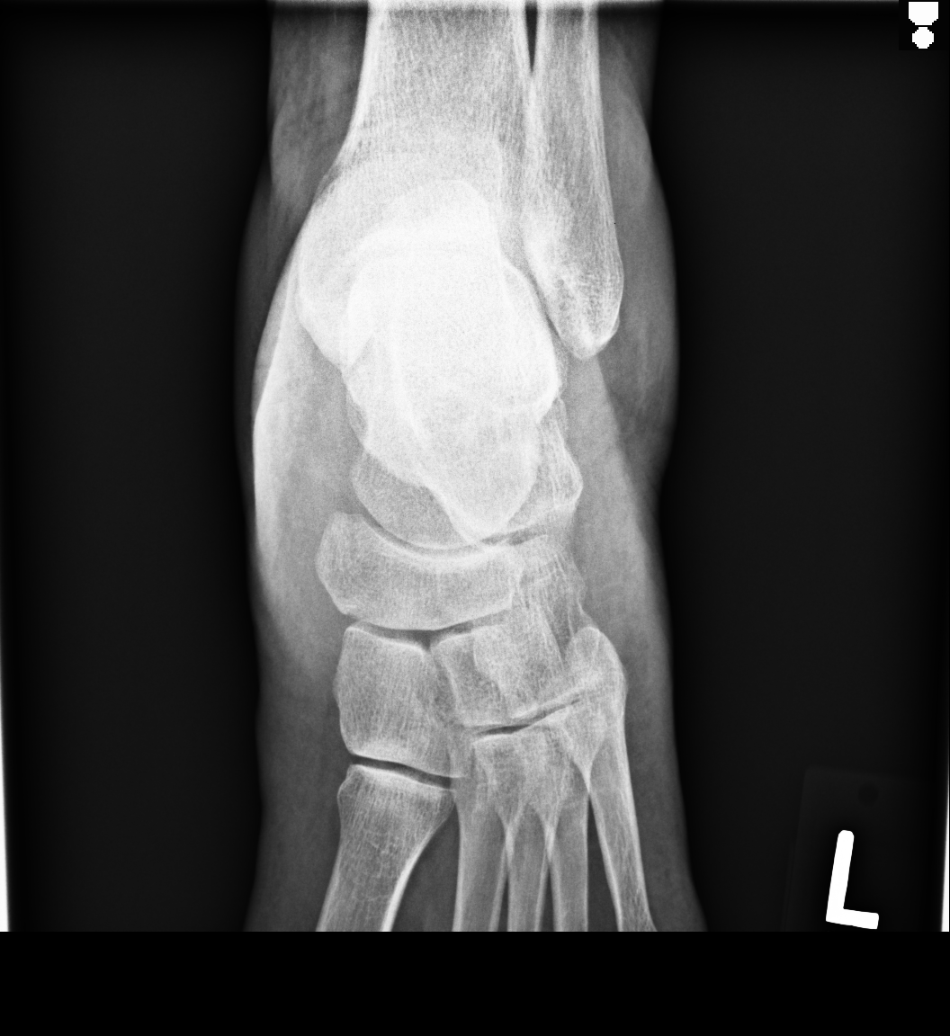
[im 2/5]
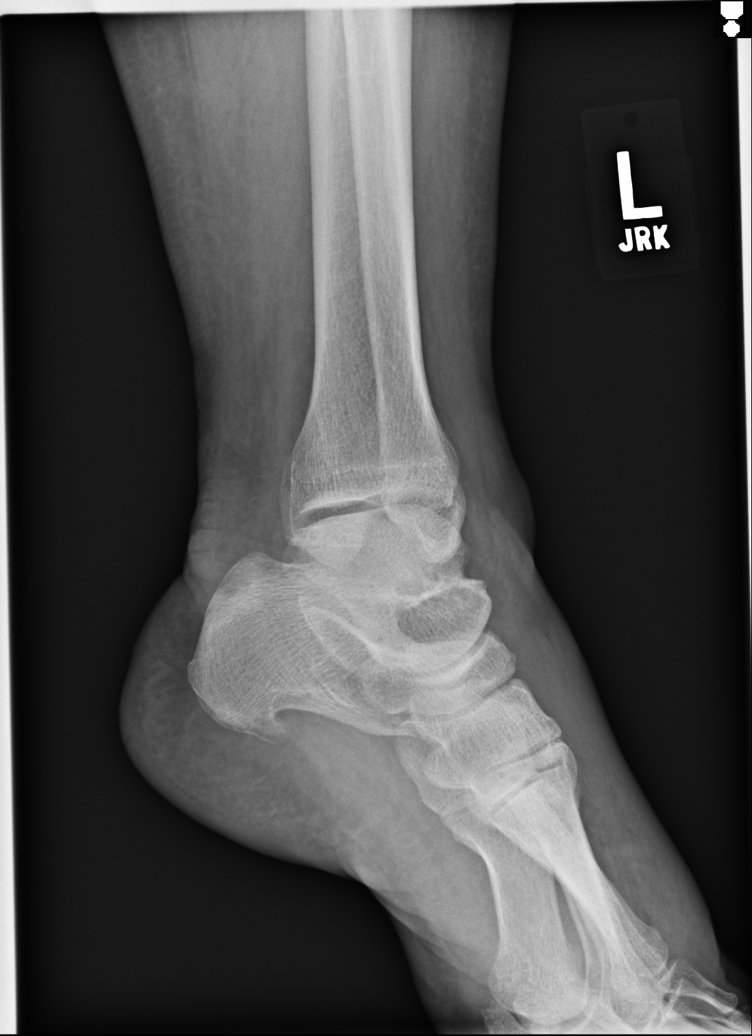
[im 3/5]
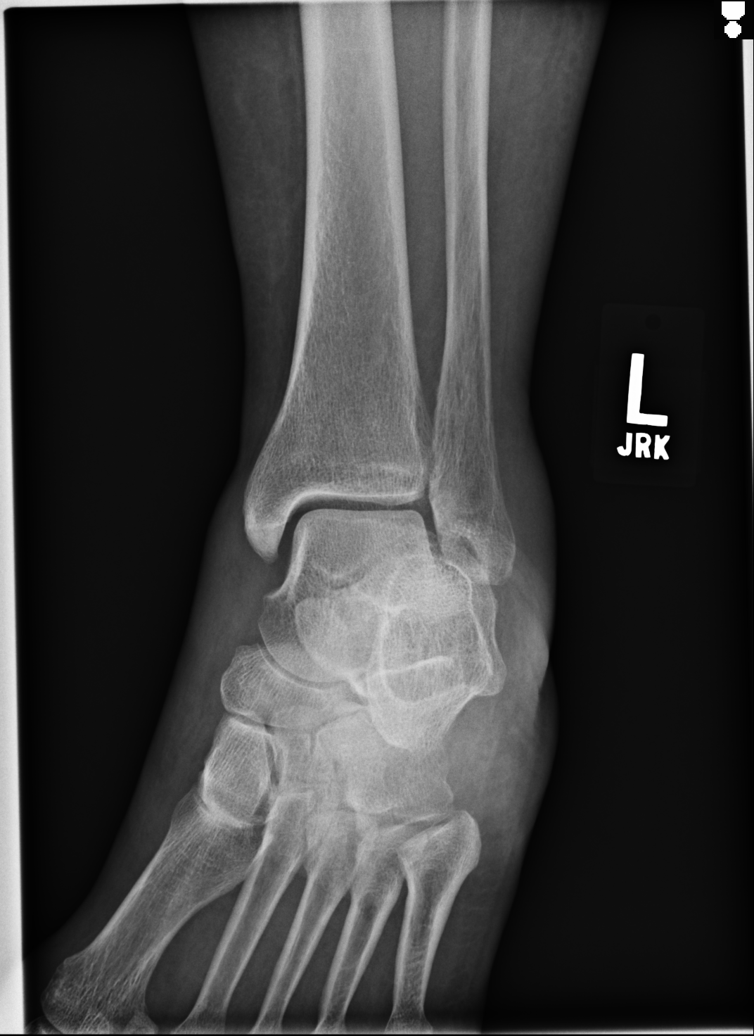
[im 4/5]
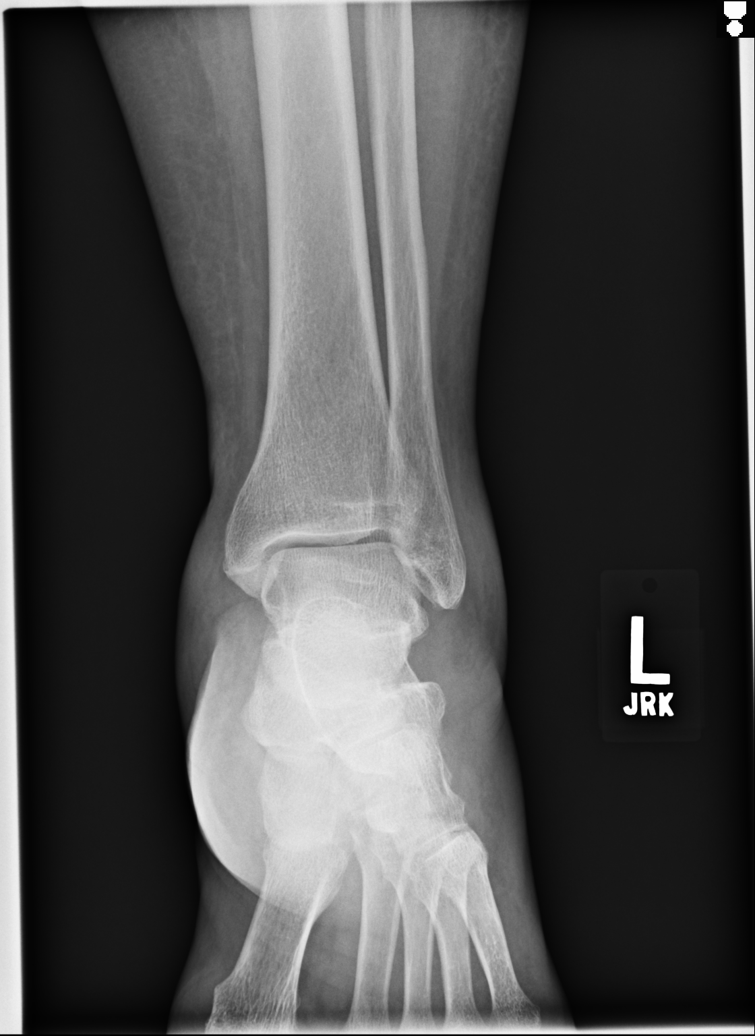
[im 5/5]
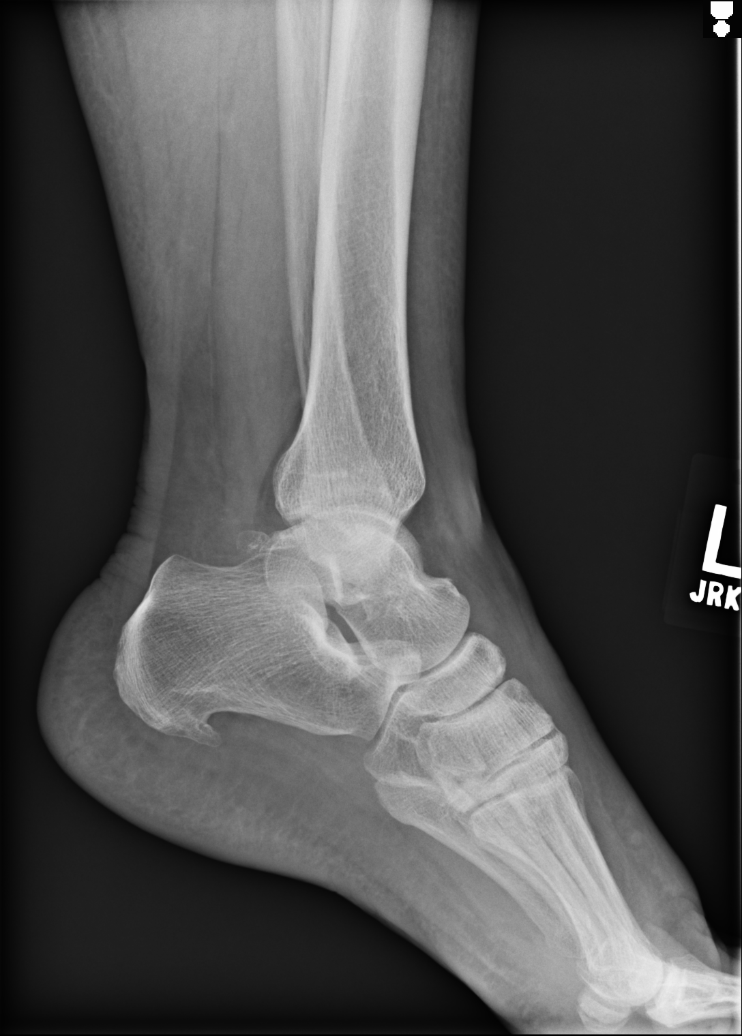

[5 of 5 positions shown; findings below may reference images not displayed]

IMPRESSION: No acute bony abnormality evident.

[REDACTED]

## 2013-11-10 IMAGING — CR DG CHEST 2V
1 series · 3 of 3 positions shown · non-contrast
Comparison: none

REASON FOR EXAM: repeat, pCXR difficult to interpret prior
COMMENTS:

[Series 1: pa · 0.17mm/px · 3 of 3 slices shown]
[im 1/3]
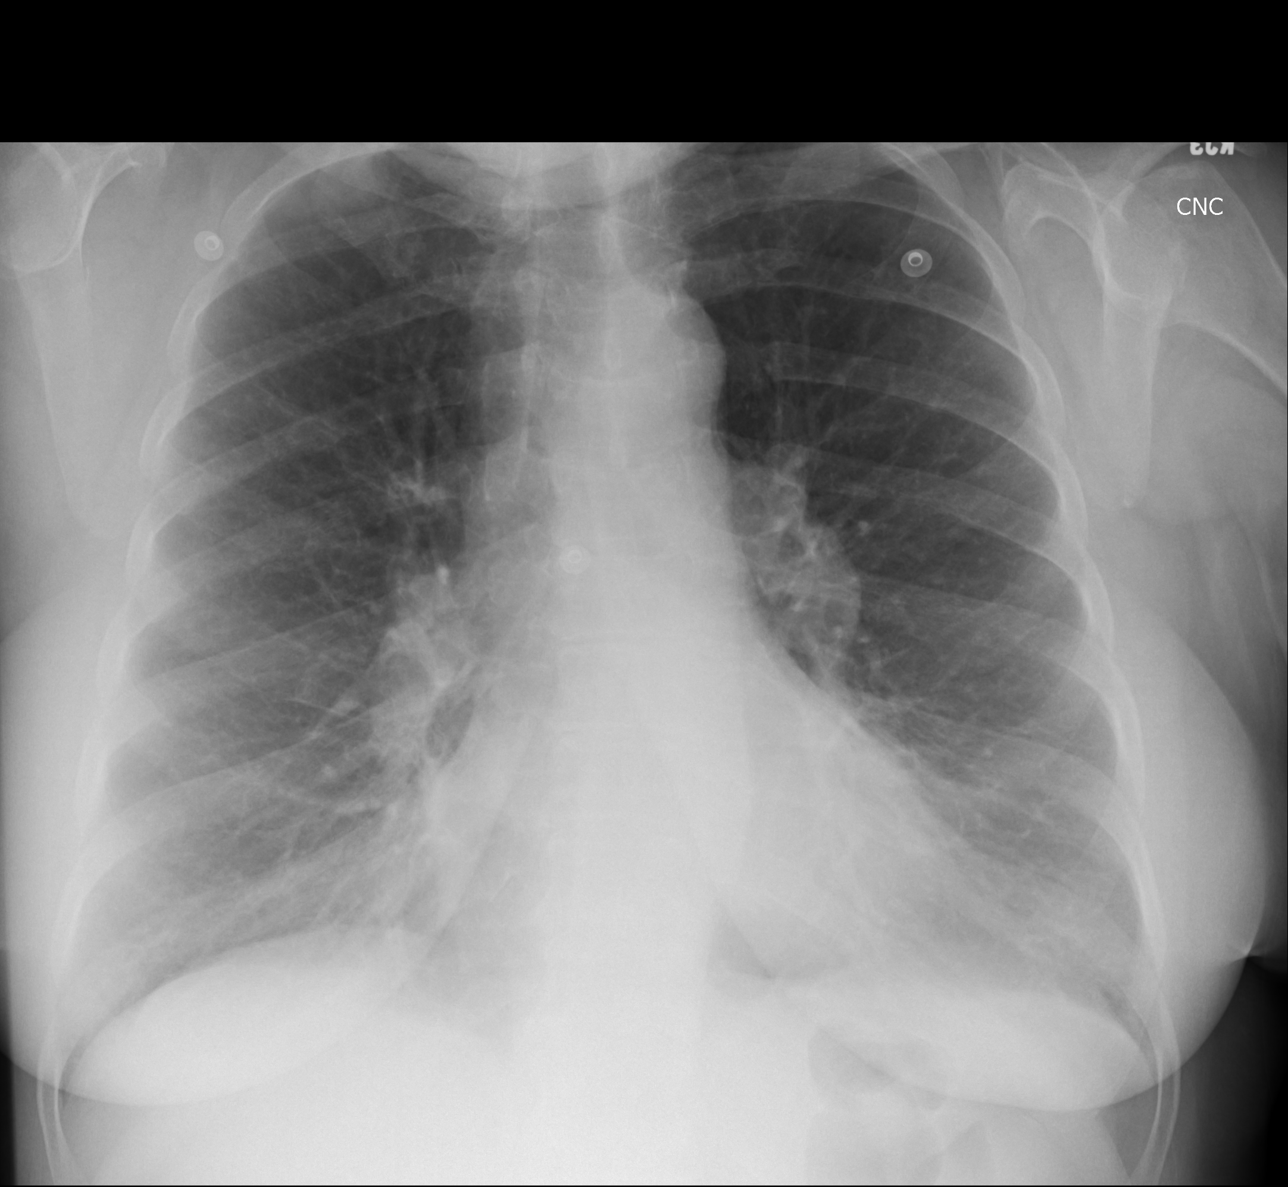
[im 2/3]
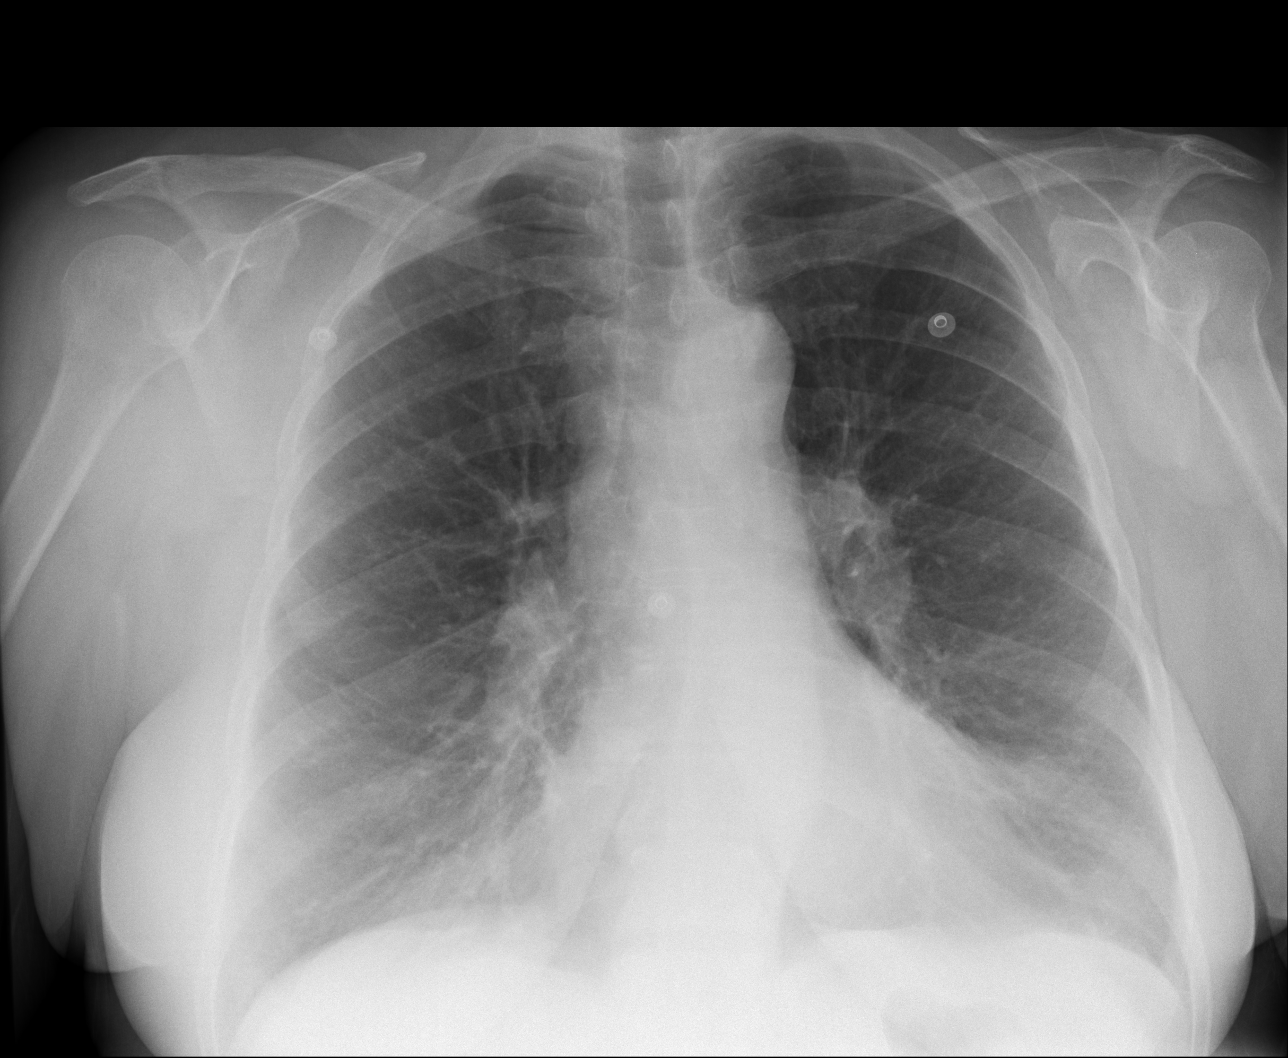
[im 3/3]
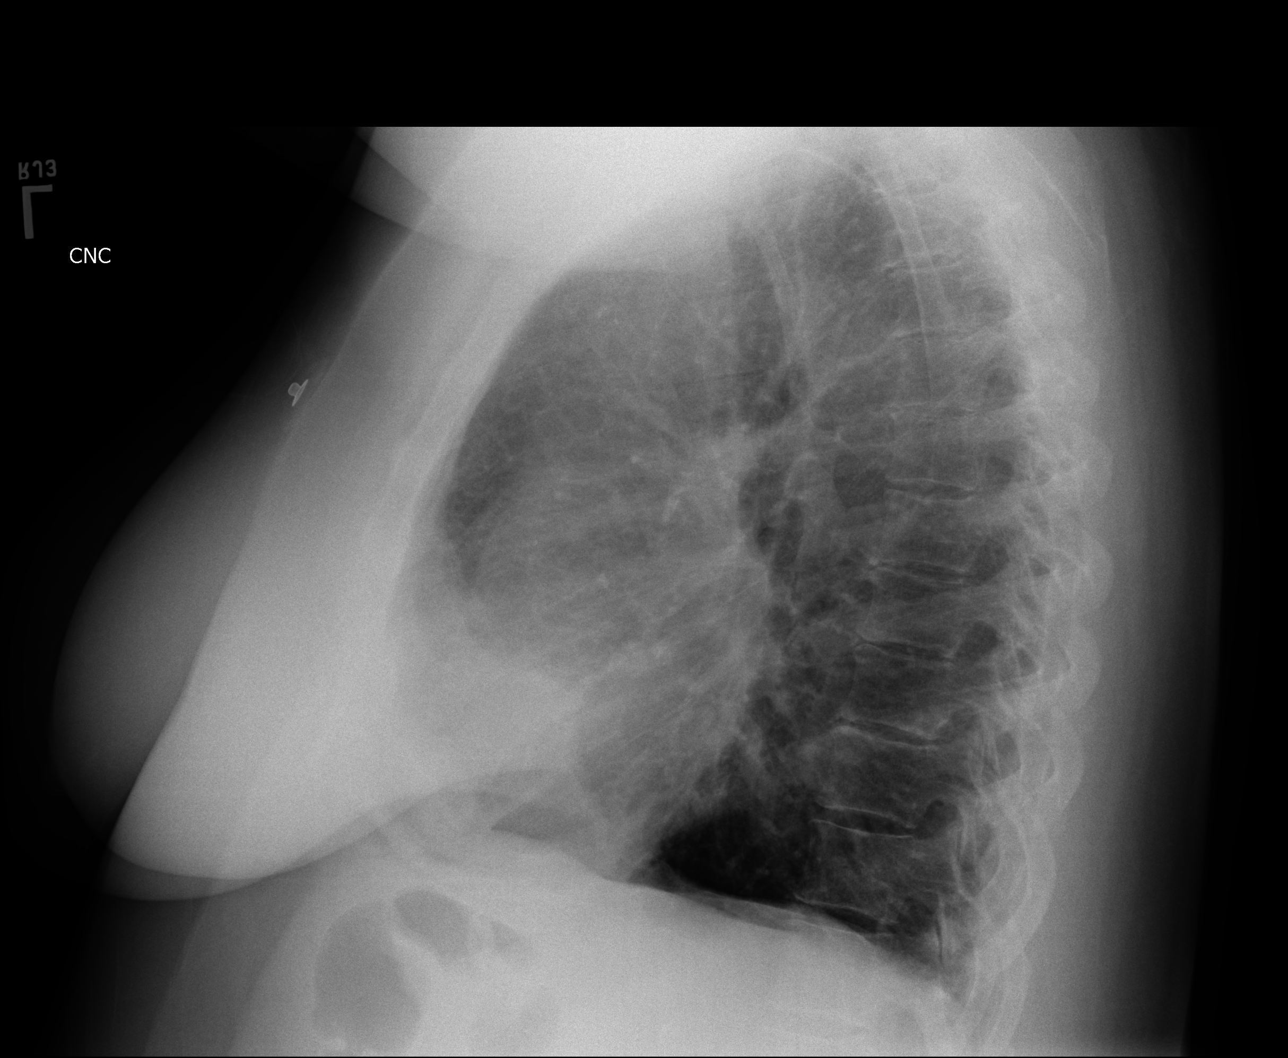

[3 of 3 positions shown; findings below may reference images not displayed]

PROCEDURE:     DXR - DXR CHEST PA (OR AP) AND LATERAL  - July 15, 2013  [DATE]

RESULT:     Comparison is made to the portable study July 15, 2013
at 0800 p.m. The lungs are hyperinflated. There are coarse infrahilar lung
markings bilaterally on the frontal film which are less conspicuous on the
lateral film. There is no pleural effusion. The mediastinum is normal in
width. The cardiac silhouette is normal in size. The pulmonary vascularity
is not engorged. Stable prominence of the central pulmonary vascularity is
noted.
IMPRESSION: The findings are consistent with COPD or reactive airway
disease. I cannot exclude superimposed acute bronchitis. There is no focal
pneumonia nor evidence of CHF.

[REDACTED]

## 2013-12-31 ENCOUNTER — Emergency Department: Payer: Self-pay | Admitting: Emergency Medicine

## 2014-01-15 ENCOUNTER — Emergency Department: Payer: Self-pay | Admitting: Internal Medicine

## 2014-01-15 LAB — CBC
HCT: 47.3 % — ABNORMAL HIGH (ref 35.0–47.0)
HGB: 15.4 g/dL (ref 12.0–16.0)
MCH: 32 pg (ref 26.0–34.0)
MCHC: 32.5 g/dL (ref 32.0–36.0)
MCV: 99 fL (ref 80–100)
Platelet: 173 10*3/uL (ref 150–440)
RBC: 4.8 10*6/uL (ref 3.80–5.20)
RDW: 14.1 % (ref 11.5–14.5)
WBC: 12.5 10*3/uL — ABNORMAL HIGH (ref 3.6–11.0)

## 2014-01-15 LAB — PRO B NATRIURETIC PEPTIDE: B-Type Natriuretic Peptide: 315 pg/mL — ABNORMAL HIGH (ref 0–125)

## 2014-01-15 LAB — BASIC METABOLIC PANEL
Anion Gap: 5 — ABNORMAL LOW (ref 7–16)
BUN: 15 mg/dL (ref 7–18)
Calcium, Total: 8.8 mg/dL (ref 8.5–10.1)
Chloride: 104 mmol/L (ref 98–107)
Co2: 32 mmol/L (ref 21–32)
Creatinine: 0.61 mg/dL (ref 0.60–1.30)
EGFR (African American): >60
EGFR (Non-African Amer.): >60
Glucose: 89 mg/dL (ref 65–99)
Osmolality: 282 (ref 275–301)
Potassium: 3.9 mmol/L (ref 3.5–5.1)
Sodium: 141 mmol/L (ref 136–145)

## 2014-01-15 LAB — TROPONIN I: Troponin-I: <0.02 ng/mL

## 2014-01-21 ENCOUNTER — Ambulatory Visit: Payer: Self-pay | Admitting: Gastroenterology

## 2014-01-21 LAB — HM COLONOSCOPY

## 2014-01-23 LAB — PATHOLOGY REPORT

## 2014-01-27 IMAGING — CR DG CHEST 1V PORT
1 series · 1 of 1 positions shown · non-contrast
Comparison: none

REASON FOR EXAM: sob
COMMENTS:

[ap]
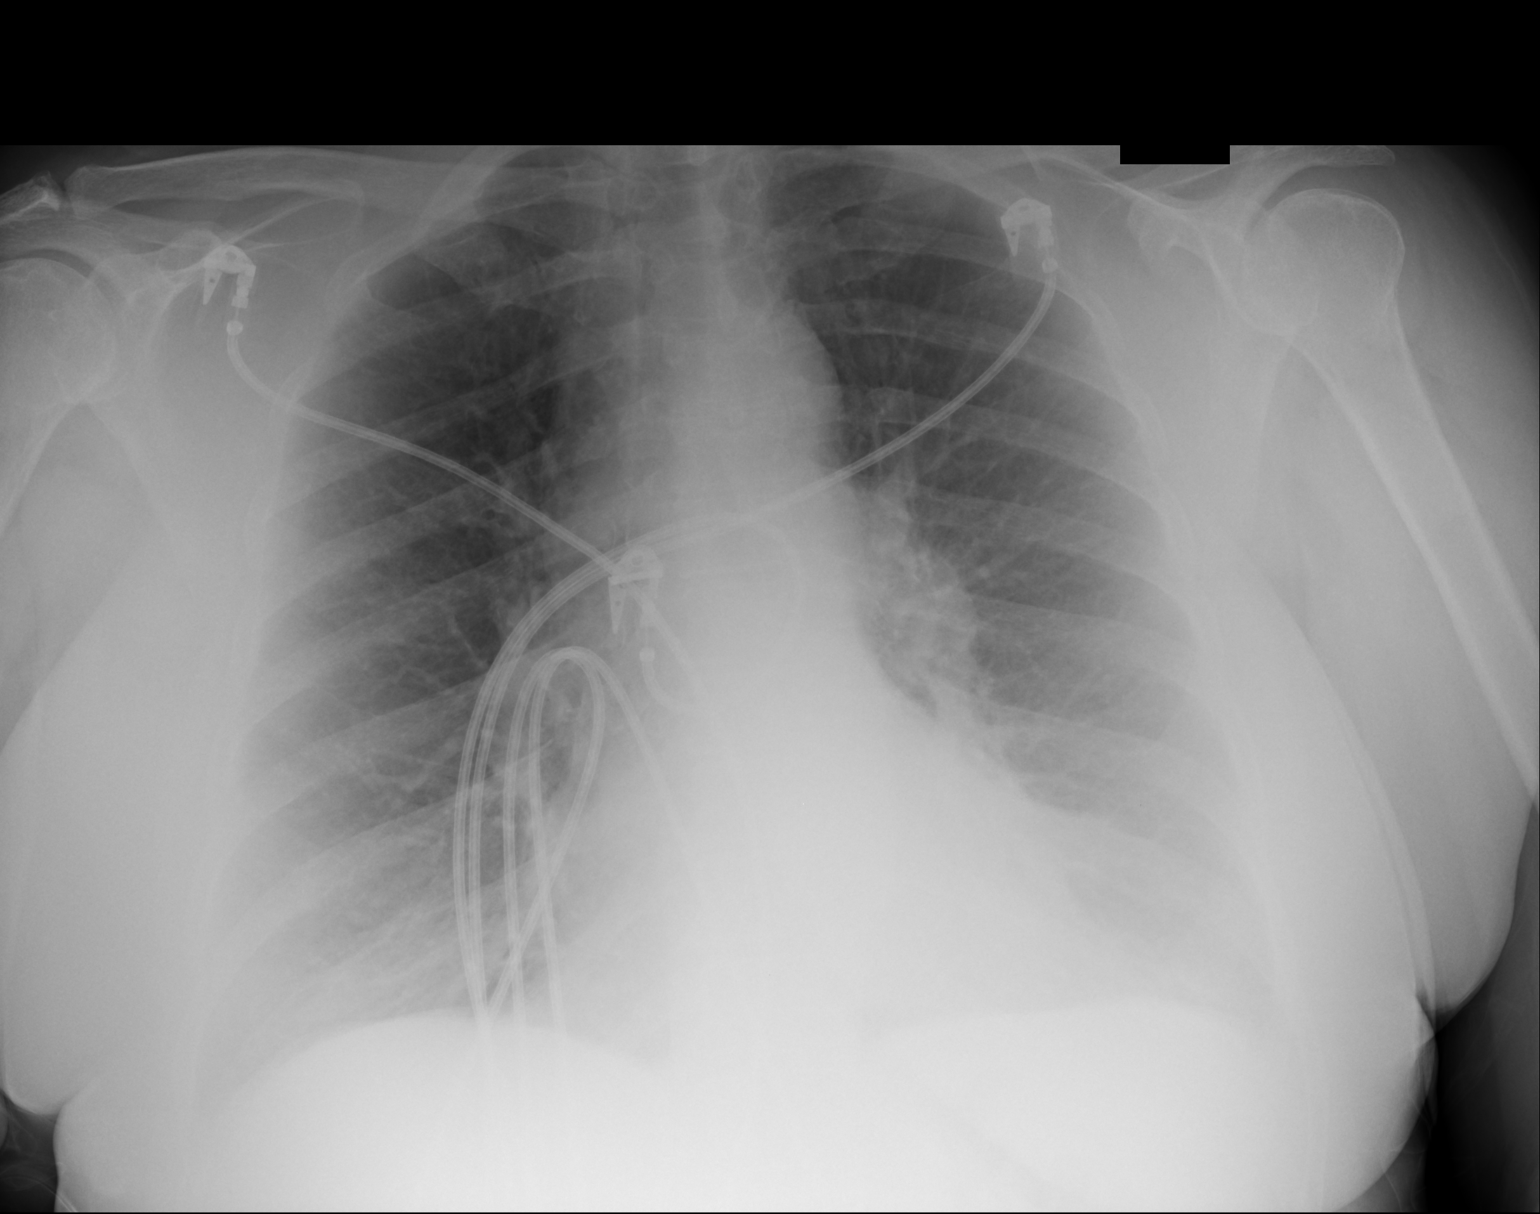

[1 of 1 positions shown; findings below may reference images not displayed]

PROCEDURE:     DXR - DXR PORTABLE CHEST SINGLE VIEW  - July 15, 2013 [DATE]

RESULT:     Comparison is made to the study December 30, 2012.

The lungs are hyperinflated. There is no focal infiltrate but the lung bases
are somewhat hazy especially on the left. The cardiac silhouette is normal
in size. The mediastinum is normal in width. There is no pleural effusion.
IMPRESSION: There is mild hyperinflation which may reflect COPD or
reactive airway disease. There is no evidence of pneumonia nor CHF. A
followup PA and lateral chest x-ray would be useful to further evaluate the
lung bases.

[REDACTED]

## 2014-02-12 IMAGING — CR DG WRIST COMPLETE 3+V*R*
2 series · 2 of 2 positions shown · non-contrast
Comparison: None.

CLINICAL DATA: Fall down stairs. Laceration and bruising along the
right wrist.

EXAM:
RIGHT WRIST - COMPLETE 3+ VIEW

[view not recorded (1 of 2)]
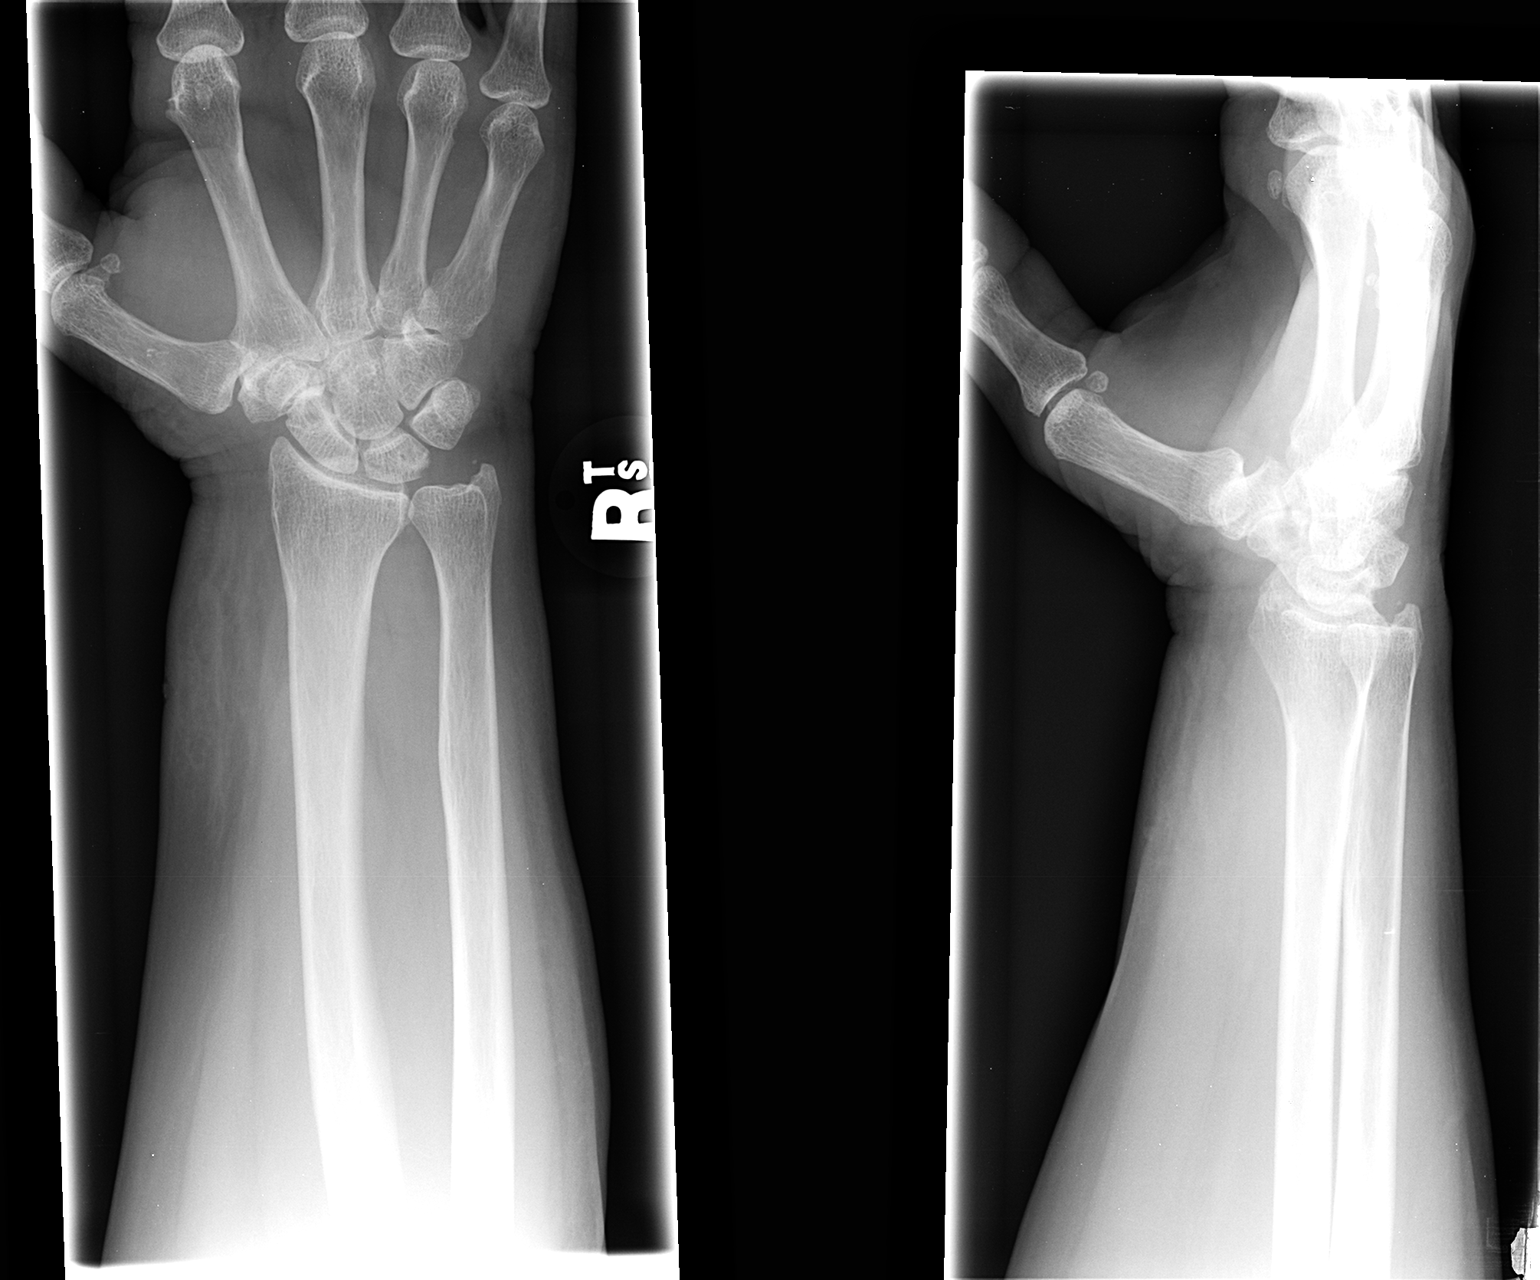

[view not recorded (2 of 2)]
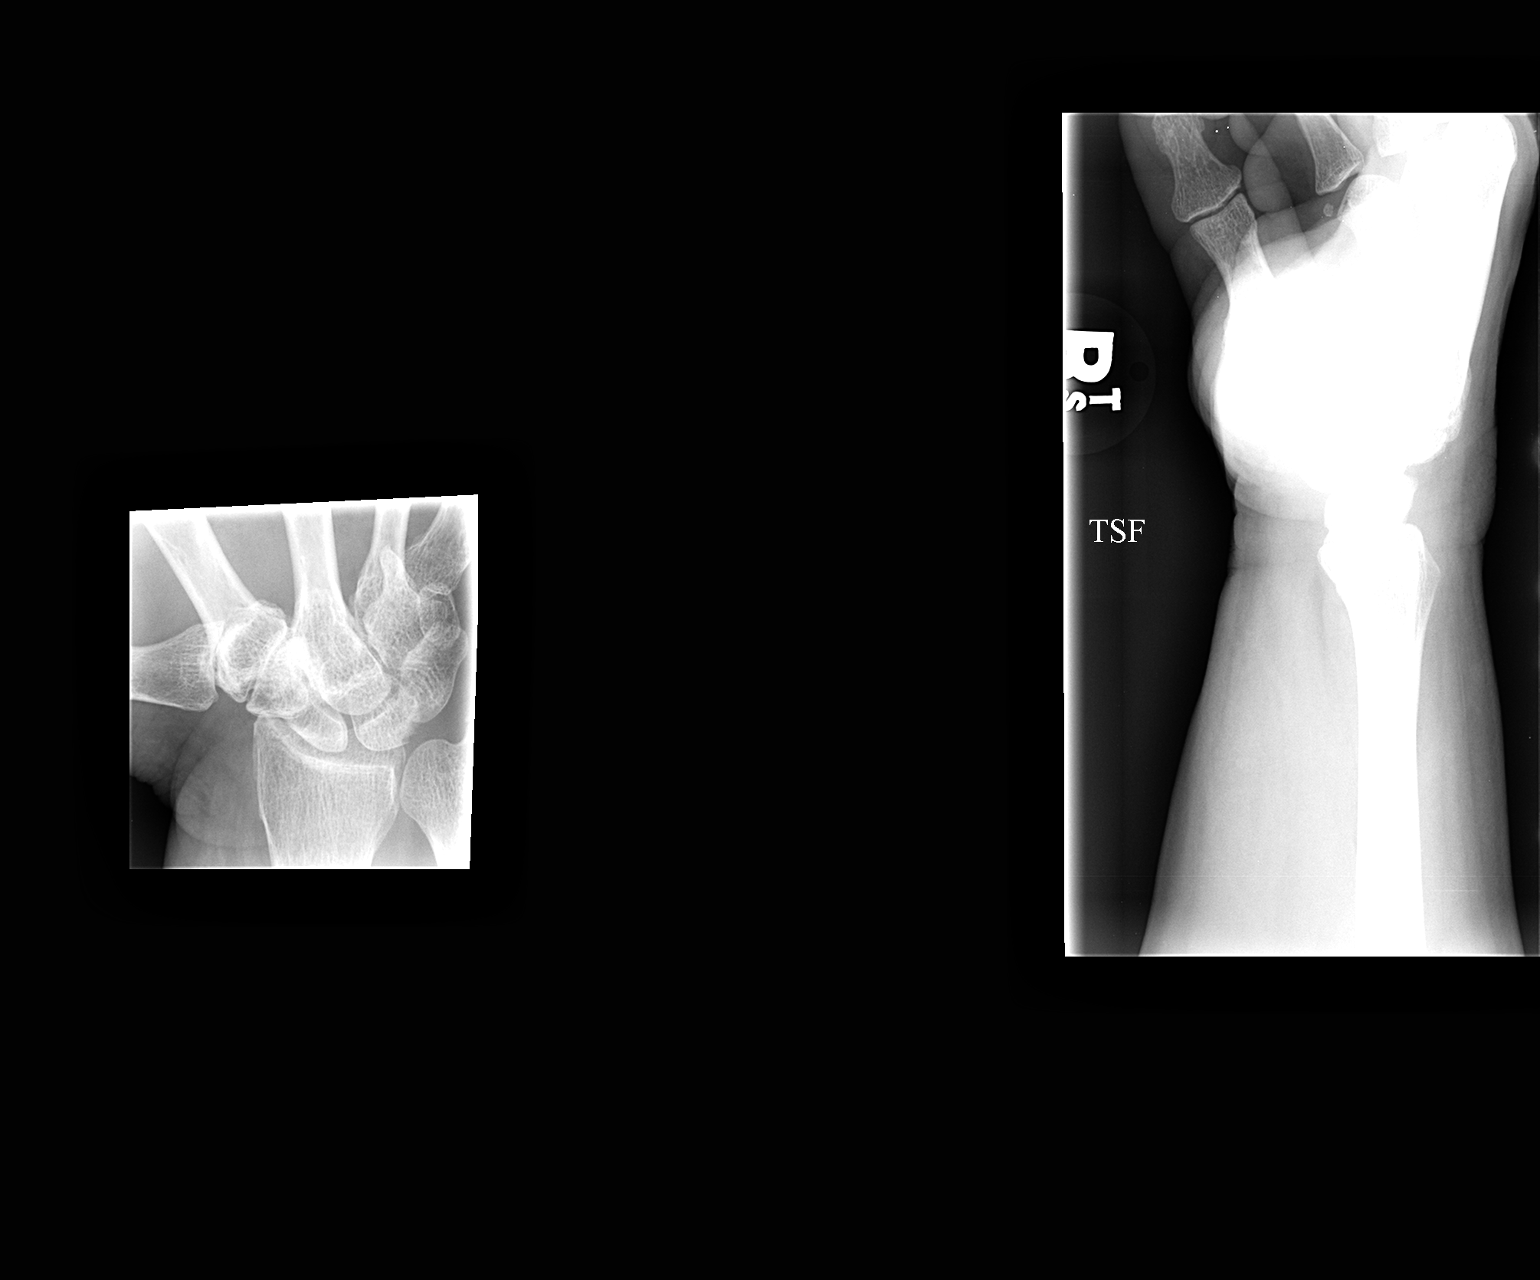

[2 of 2 positions shown; findings below may reference images not displayed]

FINDINGS: Degenerative spurring of the 1st carpometacarpal articulation.
Scalloping along the ulnar side of the lunate suggesting small
erosion.

No fracture is observed. Subcutaneous stranding along the lateral
distal forearm may reflect bruising.
IMPRESSION: 1. No acute bony findings.
2. Suspected erosion along the ulnar side of the lunate, query
erosive arthropathy.
3. Mild degenerative findings at the 1st carpometacarpal
articulation.

## 2014-02-27 DIAGNOSIS — J449 Chronic obstructive pulmonary disease, unspecified: Secondary | ICD-10-CM | POA: Insufficient documentation

## 2014-02-27 DIAGNOSIS — R9430 Abnormal result of cardiovascular function study, unspecified: Secondary | ICD-10-CM | POA: Insufficient documentation

## 2014-02-27 DIAGNOSIS — F17200 Nicotine dependence, unspecified, uncomplicated: Secondary | ICD-10-CM | POA: Insufficient documentation

## 2014-02-27 DIAGNOSIS — E669 Obesity, unspecified: Secondary | ICD-10-CM | POA: Insufficient documentation

## 2014-03-21 ENCOUNTER — Ambulatory Visit: Payer: Self-pay | Admitting: Internal Medicine

## 2014-05-01 IMAGING — CR DG CHEST 2V
2 series · 2 of 2 positions shown · non-contrast
Comparison: None.

CLINICAL DATA: Fall down stairs. Tobacco use. Shortness of breath.

EXAM:
CHEST  2 VIEW

[view not recorded (1 of 2)]
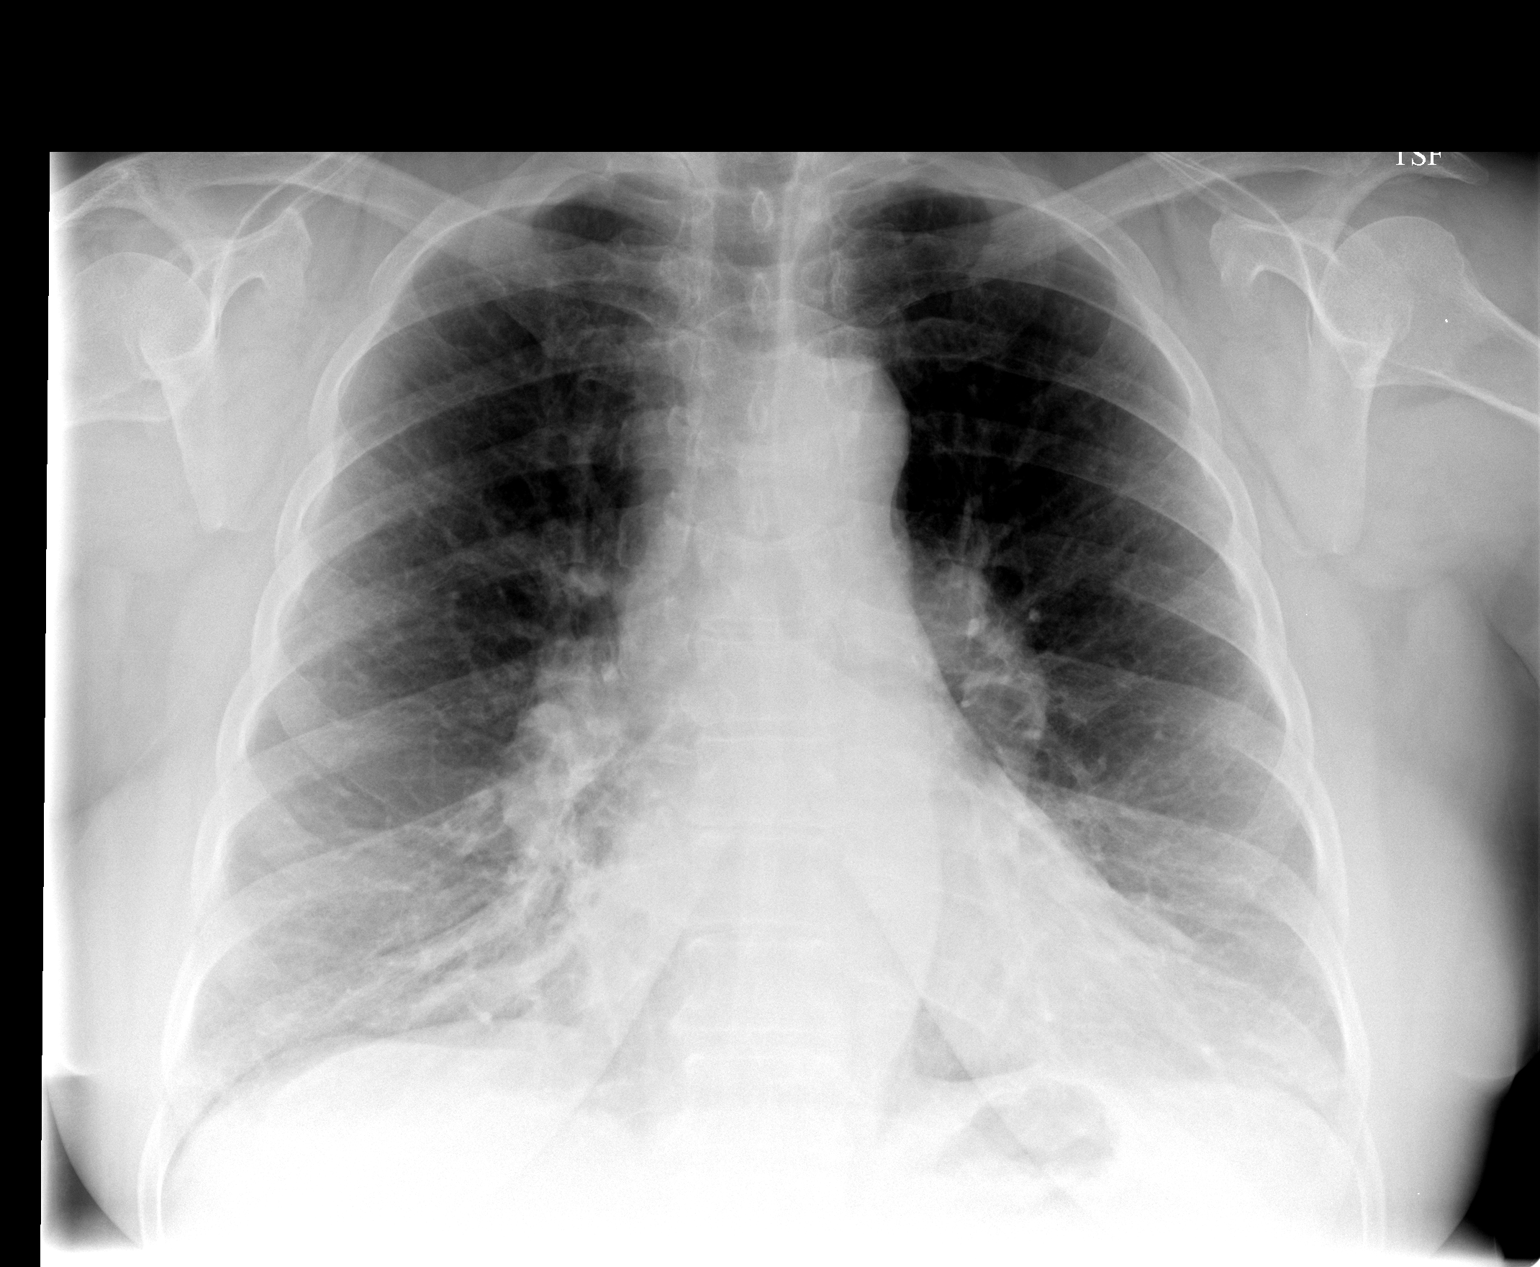

[view not recorded (2 of 2)]
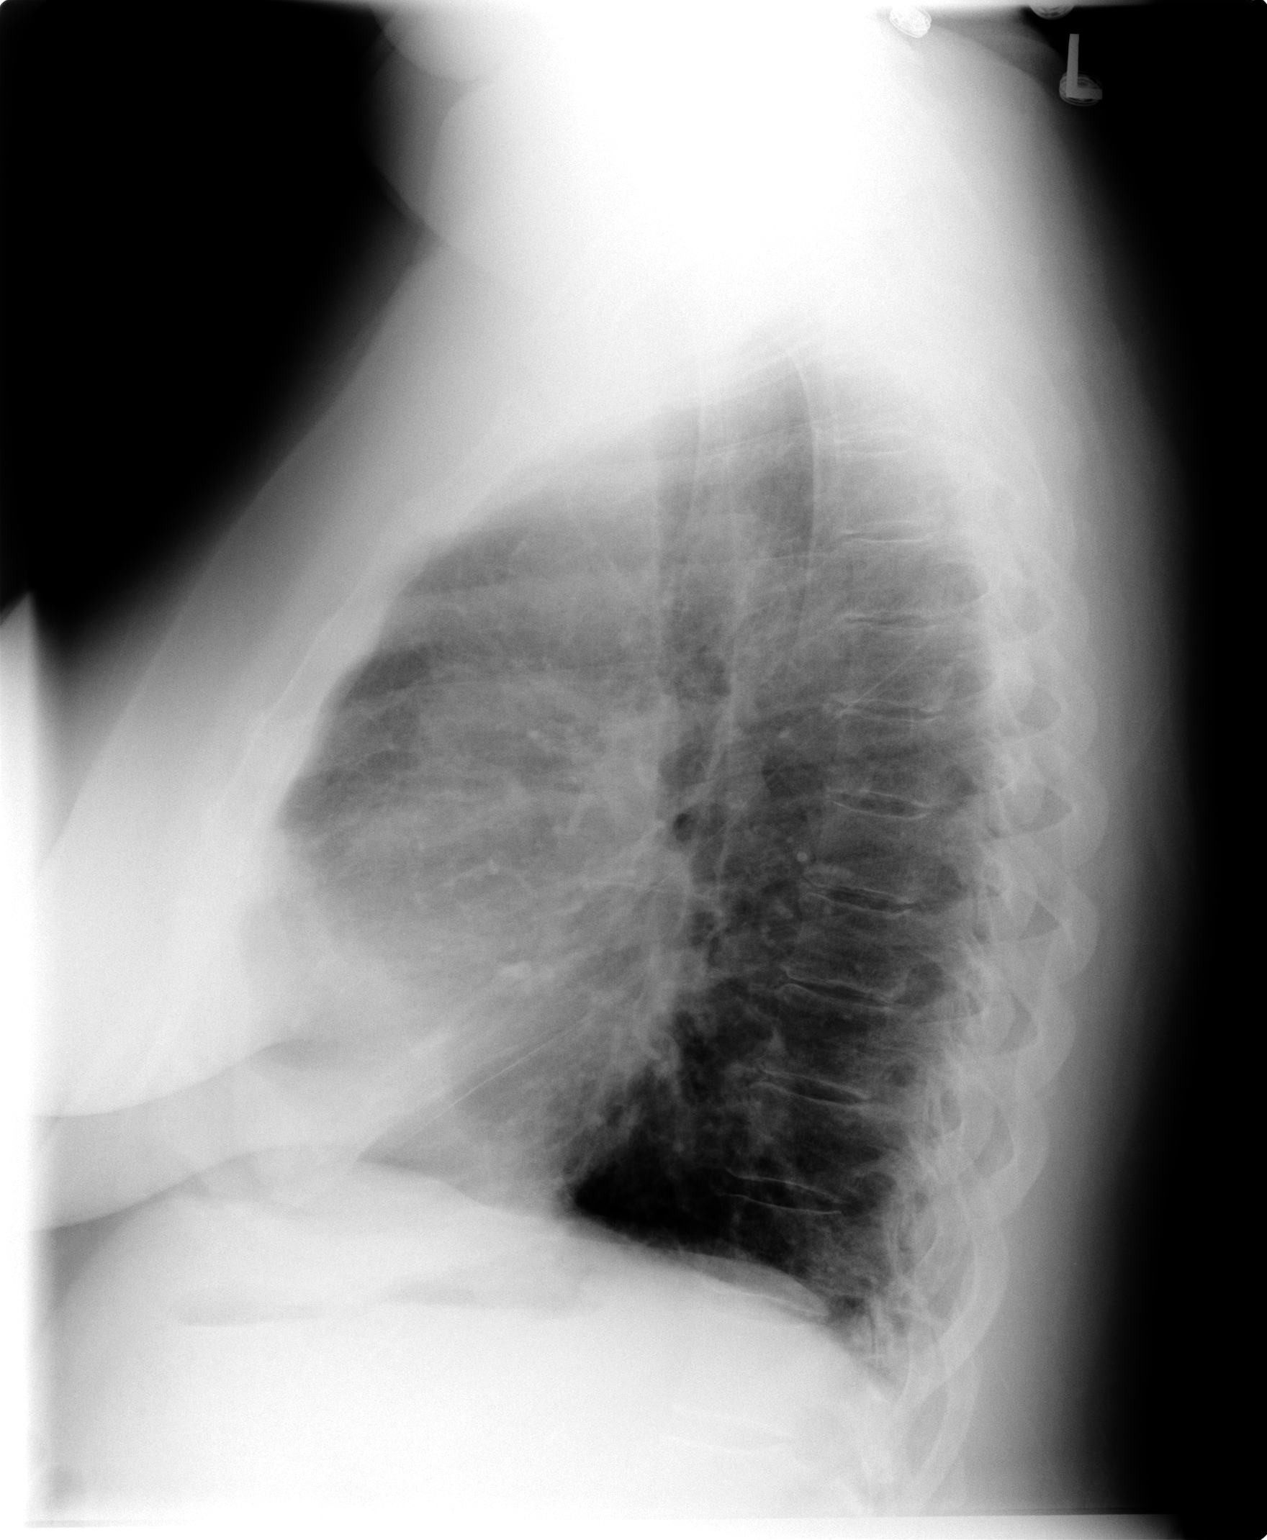

[2 of 2 positions shown; findings below may reference images not displayed]

FINDINGS: No pneumothorax or pulmonary contusion. Subsegmental atelectasis
noted medially at the right lung base. Lungs appear otherwise clear.
Mild bilateral hilar prominence is probably vascular.

Large lung volumes favor emphysema.  Mild thoracic spondylosis.
IMPRESSION: 1. Suspected emphysema.
2. Subsegmental atelectasis medially at the right lung base.
3. No pneumothorax or pleural effusion.

## 2014-06-09 ENCOUNTER — Ambulatory Visit: Payer: Self-pay | Admitting: Orthopedic Surgery

## 2014-06-10 ENCOUNTER — Emergency Department: Payer: Self-pay | Admitting: Emergency Medicine

## 2014-06-10 LAB — CBC
HCT: 41.9 % (ref 35.0–47.0)
HGB: 13.2 g/dL (ref 12.0–16.0)
MCH: 31.9 pg (ref 26.0–34.0)
MCHC: 31.6 g/dL — ABNORMAL LOW (ref 32.0–36.0)
MCV: 101 fL — ABNORMAL HIGH (ref 80–100)
Platelet: 143 10*3/uL — ABNORMAL LOW (ref 150–440)
RBC: 4.15 10*6/uL (ref 3.80–5.20)
RDW: 14.5 % (ref 11.5–14.5)
WBC: 11 10*3/uL (ref 3.6–11.0)

## 2014-06-10 LAB — BASIC METABOLIC PANEL
Anion Gap: 6 — ABNORMAL LOW (ref 7–16)
BUN: 12 mg/dL (ref 7–18)
Calcium, Total: 8.4 mg/dL — ABNORMAL LOW (ref 8.5–10.1)
Chloride: 104 mmol/L (ref 98–107)
Co2: 34 mmol/L — ABNORMAL HIGH (ref 21–32)
Creatinine: 0.55 mg/dL — ABNORMAL LOW (ref 0.60–1.30)
EGFR (African American): >60
EGFR (Non-African Amer.): >60
Glucose: 113 mg/dL — ABNORMAL HIGH (ref 65–99)
Osmolality: 287 (ref 275–301)
Potassium: 3.9 mmol/L (ref 3.5–5.1)
Sodium: 144 mmol/L (ref 136–145)

## 2014-06-10 LAB — TROPONIN I: Troponin-I: <0.02 ng/mL

## 2014-06-10 LAB — PRO B NATRIURETIC PEPTIDE: B-Type Natriuretic Peptide: 865 pg/mL — ABNORMAL HIGH (ref 0–125)

## 2014-06-11 LAB — TROPONIN I: Troponin-I: <0.02 ng/mL

## 2014-06-12 ENCOUNTER — Emergency Department: Payer: Self-pay | Admitting: Emergency Medicine

## 2014-06-12 ENCOUNTER — Ambulatory Visit: Payer: Self-pay

## 2014-06-12 LAB — COMPREHENSIVE METABOLIC PANEL
Albumin: 3.7 g/dL (ref 3.4–5.0)
Alkaline Phosphatase: 113 U/L
Anion Gap: 7 (ref 7–16)
BUN: 9 mg/dL (ref 7–18)
Bilirubin,Total: 0.7 mg/dL (ref 0.2–1.0)
Calcium, Total: 9.1 mg/dL (ref 8.5–10.1)
Chloride: 103 mmol/L (ref 98–107)
Co2: 31 mmol/L (ref 21–32)
Creatinine: 0.79 mg/dL (ref 0.60–1.30)
EGFR (African American): >60
EGFR (Non-African Amer.): >60
Glucose: 103 mg/dL — ABNORMAL HIGH (ref 65–99)
Osmolality: 280 (ref 275–301)
Potassium: 3.2 mmol/L — ABNORMAL LOW (ref 3.5–5.1)
SGOT(AST): 14 U/L — ABNORMAL LOW (ref 15–37)
SGPT (ALT): 35 U/L
Sodium: 141 mmol/L (ref 136–145)
Total Protein: 7.6 g/dL (ref 6.4–8.2)

## 2014-06-12 LAB — LIPASE, BLOOD: Lipase: 118 U/L (ref 73–393)

## 2014-06-12 LAB — CBC WITH DIFFERENTIAL/PLATELET
Basophil #: 0.1 10*3/uL (ref 0.0–0.1)
Basophil %: 0.9 %
Eosinophil #: 0 10*3/uL (ref 0.0–0.7)
Eosinophil %: 0.4 %
HCT: 48 % — ABNORMAL HIGH (ref 35.0–47.0)
HGB: 15.6 g/dL (ref 12.0–16.0)
Lymphocyte #: 1.8 10*3/uL (ref 1.0–3.6)
Lymphocyte %: 14.6 %
MCH: 32.2 pg (ref 26.0–34.0)
MCHC: 32.4 g/dL (ref 32.0–36.0)
MCV: 99 fL (ref 80–100)
Monocyte #: 0.9 x10 3/mm (ref 0.2–0.9)
Monocyte %: 7.3 %
Neutrophil #: 9.4 10*3/uL — ABNORMAL HIGH (ref 1.4–6.5)
Neutrophil %: 76.8 %
Platelet: 193 10*3/uL (ref 150–440)
RBC: 4.84 10*6/uL (ref 3.80–5.20)
RDW: 14.4 % (ref 11.5–14.5)
WBC: 12.2 10*3/uL — ABNORMAL HIGH (ref 3.6–11.0)

## 2014-06-12 LAB — URINALYSIS, COMPLETE
Bilirubin,UR: NEGATIVE
Glucose,UR: NEGATIVE mg/dL (ref 0–75)
Leukocyte Esterase: NEGATIVE
Nitrite: NEGATIVE
Ph: 8 (ref 4.5–8.0)
Protein: NEGATIVE
RBC,UR: 20 /HPF (ref 0–5)
Specific Gravity: 1.011 (ref 1.003–1.030)
Squamous Epithelial: 2
WBC UR: 1 /HPF (ref 0–5)

## 2014-06-30 ENCOUNTER — Ambulatory Visit: Payer: Self-pay | Admitting: Family Medicine

## 2014-07-02 DIAGNOSIS — M5116 Intervertebral disc disorders with radiculopathy, lumbar region: Secondary | ICD-10-CM | POA: Insufficient documentation

## 2014-07-02 DIAGNOSIS — M5136 Other intervertebral disc degeneration, lumbar region: Secondary | ICD-10-CM | POA: Insufficient documentation

## 2014-07-15 IMAGING — CR DG SHOULDER 3+V*R*
1 series · 5 of 5 positions shown · non-contrast
Comparison: None.

CLINICAL DATA: Pain in arm related to fall

EXAM:
DG SHOULDER 3+ VIEWS RIGHT

[Series 1: internal rotate · 0.17mm/px · 5 of 5 slices shown]
[im 1/5]
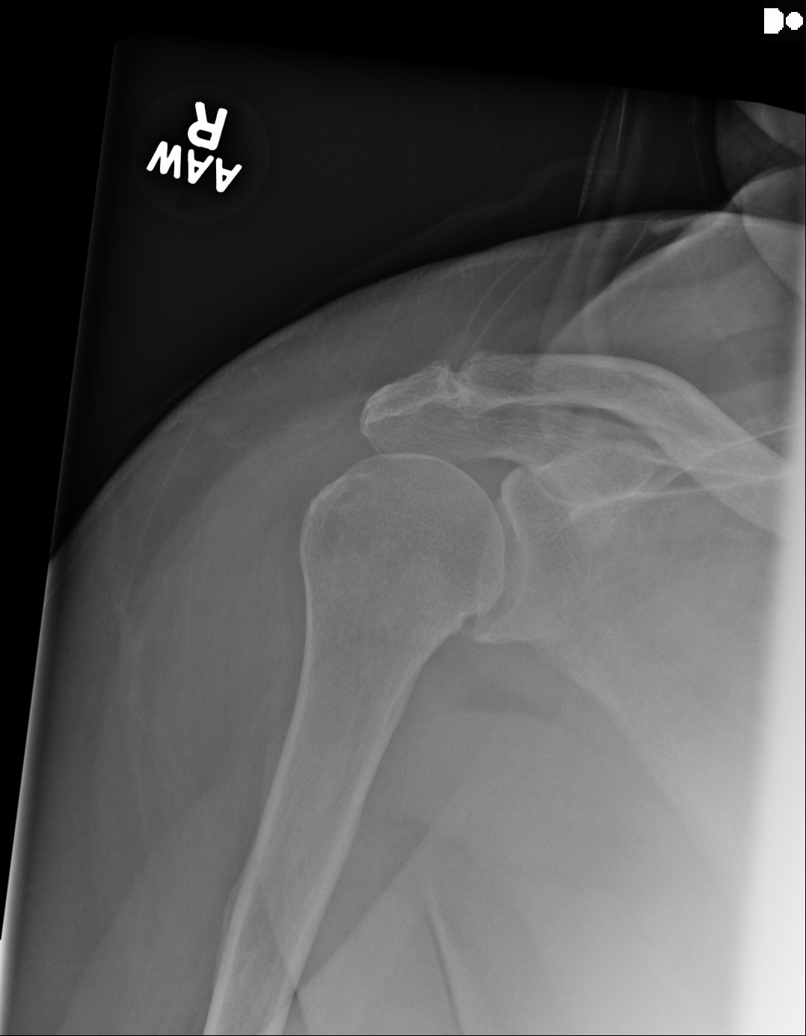
[im 2/5]
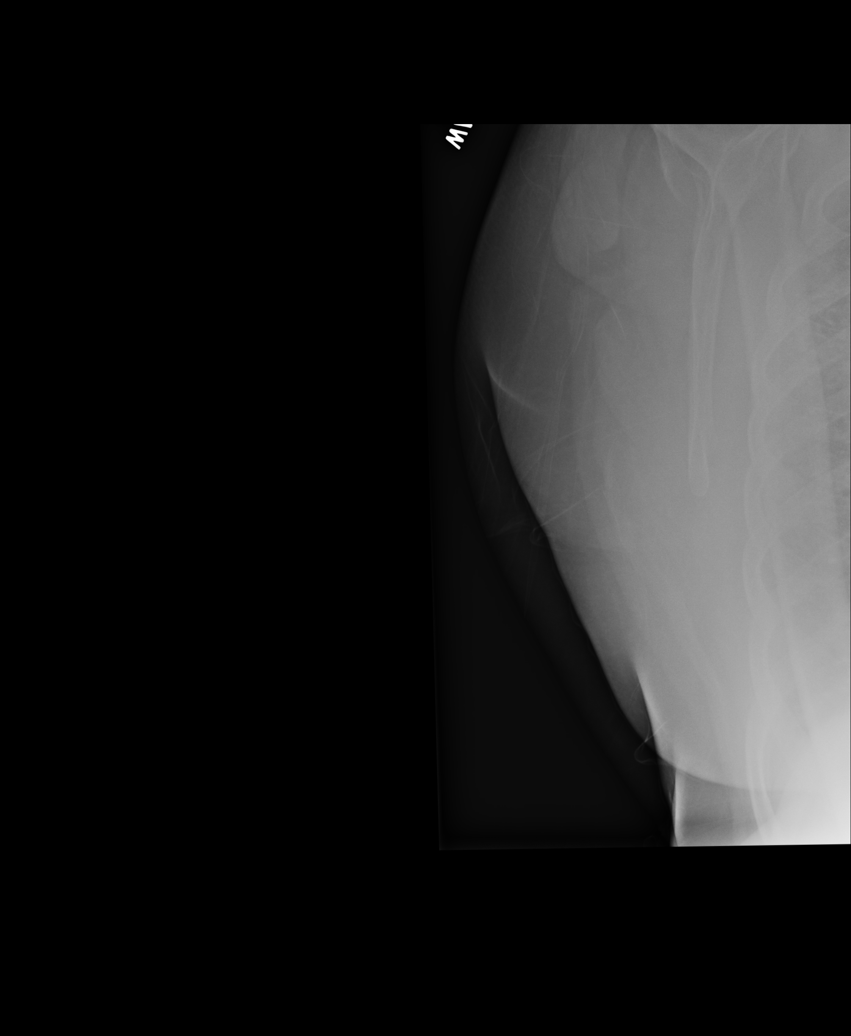
[im 3/5]
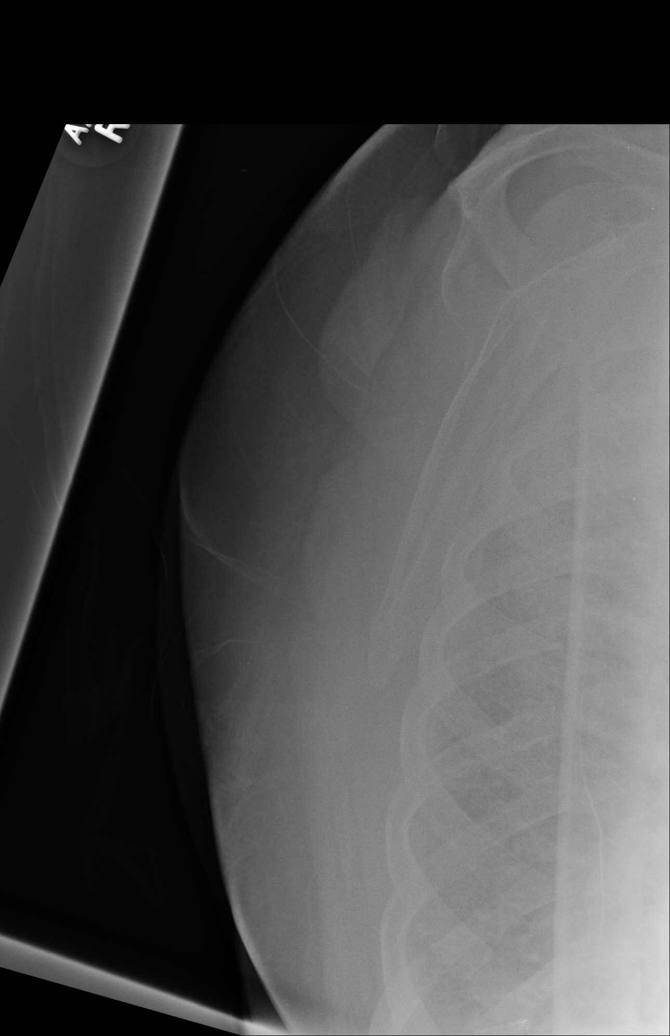
[im 4/5]
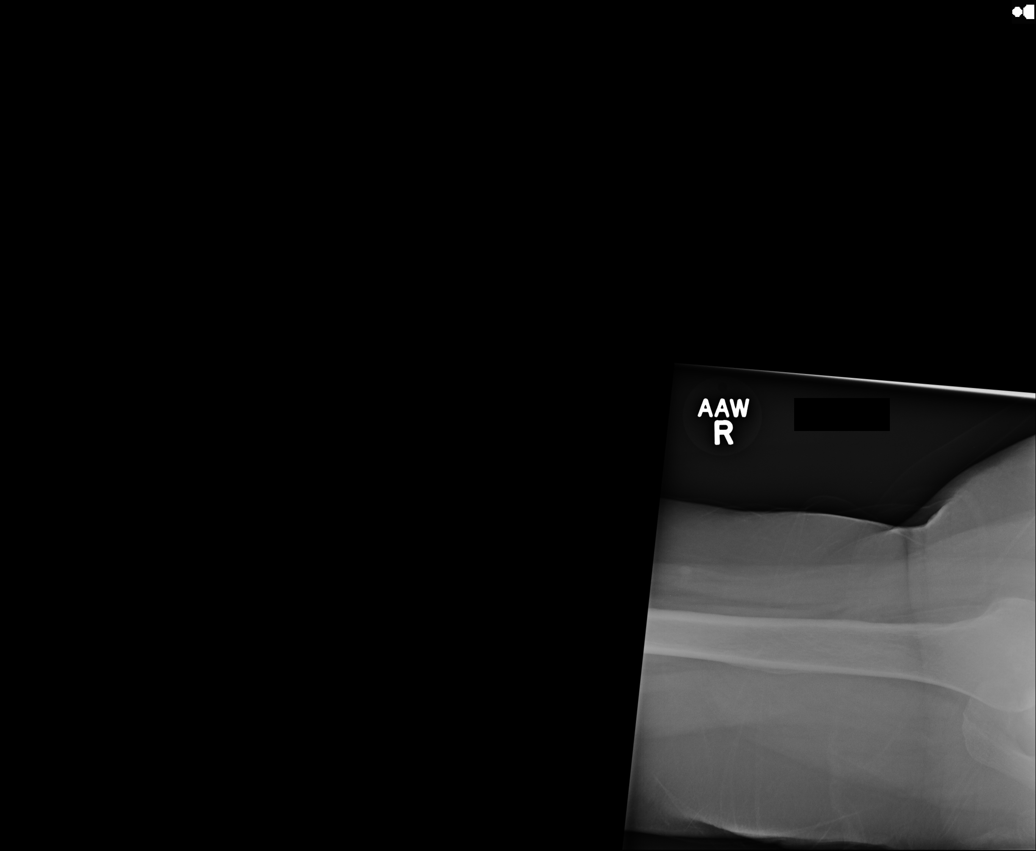
[im 5/5]
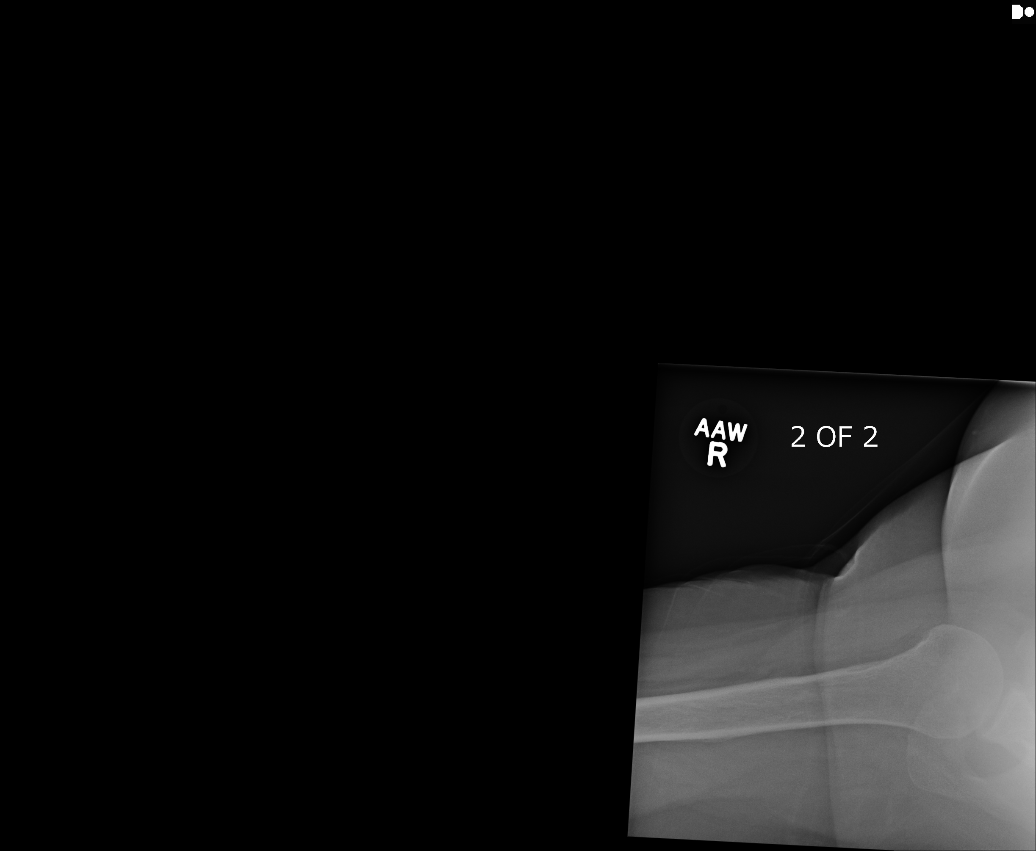

[5 of 5 positions shown; findings below may reference images not displayed]

FINDINGS: Mild degenerative change in the acromioclavicular joint. No acute
abnormalities involving the glenohumeral joint. Small osteophytes
noted.
IMPRESSION: Mild degenerative changes.

## 2014-07-16 DIAGNOSIS — R531 Weakness: Secondary | ICD-10-CM | POA: Insufficient documentation

## 2014-07-16 DIAGNOSIS — R609 Edema, unspecified: Secondary | ICD-10-CM | POA: Insufficient documentation

## 2014-07-16 DIAGNOSIS — F32A Depression, unspecified: Secondary | ICD-10-CM | POA: Insufficient documentation

## 2014-07-16 DIAGNOSIS — R0609 Other forms of dyspnea: Secondary | ICD-10-CM

## 2014-07-16 DIAGNOSIS — M799 Soft tissue disorder, unspecified: Secondary | ICD-10-CM | POA: Insufficient documentation

## 2014-07-16 DIAGNOSIS — M6281 Muscle weakness (generalized): Secondary | ICD-10-CM | POA: Insufficient documentation

## 2014-07-16 DIAGNOSIS — F329 Major depressive disorder, single episode, unspecified: Secondary | ICD-10-CM | POA: Insufficient documentation

## 2014-07-16 DIAGNOSIS — G5600 Carpal tunnel syndrome, unspecified upper limb: Secondary | ICD-10-CM | POA: Insufficient documentation

## 2014-07-16 DIAGNOSIS — J310 Chronic rhinitis: Secondary | ICD-10-CM | POA: Insufficient documentation

## 2014-07-16 DIAGNOSIS — F411 Generalized anxiety disorder: Secondary | ICD-10-CM | POA: Insufficient documentation

## 2014-07-16 DIAGNOSIS — R0689 Other abnormalities of breathing: Secondary | ICD-10-CM | POA: Insufficient documentation

## 2014-07-16 DIAGNOSIS — M7989 Other specified soft tissue disorders: Secondary | ICD-10-CM | POA: Insufficient documentation

## 2014-07-16 DIAGNOSIS — R110 Nausea: Secondary | ICD-10-CM | POA: Insufficient documentation

## 2014-07-17 DIAGNOSIS — R1084 Generalized abdominal pain: Secondary | ICD-10-CM | POA: Insufficient documentation

## 2014-07-21 ENCOUNTER — Ambulatory Visit: Payer: Self-pay | Admitting: Unknown Physician Specialty

## 2014-07-31 DIAGNOSIS — R1033 Periumbilical pain: Secondary | ICD-10-CM | POA: Insufficient documentation

## 2014-09-08 ENCOUNTER — Ambulatory Visit: Payer: Self-pay | Admitting: Unknown Physician Specialty

## 2014-09-17 ENCOUNTER — Ambulatory Visit: Payer: Self-pay | Admitting: Obstetrics and Gynecology

## 2014-10-01 ENCOUNTER — Emergency Department: Payer: Self-pay | Admitting: Emergency Medicine

## 2014-10-01 LAB — BASIC METABOLIC PANEL
Anion Gap: 2 — ABNORMAL LOW (ref 7–16)
BUN: 11 mg/dL (ref 7–18)
Calcium, Total: 8.7 mg/dL (ref 8.5–10.1)
Chloride: 105 mmol/L (ref 98–107)
Co2: 34 mmol/L — ABNORMAL HIGH (ref 21–32)
Creatinine: 0.61 mg/dL (ref 0.60–1.30)
EGFR (African American): >60
EGFR (Non-African Amer.): >60
Glucose: 108 mg/dL — ABNORMAL HIGH (ref 65–99)
Osmolality: 281 (ref 275–301)
Potassium: 4.4 mmol/L (ref 3.5–5.1)
Sodium: 141 mmol/L (ref 136–145)

## 2014-10-01 LAB — CBC
HCT: 48.7 % — ABNORMAL HIGH (ref 35.0–47.0)
HGB: 15.9 g/dL (ref 12.0–16.0)
MCH: 32 pg (ref 26.0–34.0)
MCHC: 32.7 g/dL (ref 32.0–36.0)
MCV: 98 fL (ref 80–100)
Platelet: 201 10*3/uL (ref 150–440)
RBC: 4.97 10*6/uL (ref 3.80–5.20)
RDW: 14.6 % — ABNORMAL HIGH (ref 11.5–14.5)
WBC: 9.1 10*3/uL (ref 3.6–11.0)

## 2014-10-01 LAB — TROPONIN I: Troponin-I: <0.02 ng/mL

## 2014-10-06 IMAGING — CR DG CHEST 2V
1 series · 2 of 2 positions shown · non-contrast
Comparison: 01/15/2014

CLINICAL DATA: Three day history of worsening shortness of breath
and ankle swelling.

EXAM:
CHEST  2 VIEW

[Series 1: dxr chest pa (or ap) and lateral · 0.14mm/px · 2 of 2 slices shown]
[im 1/2]
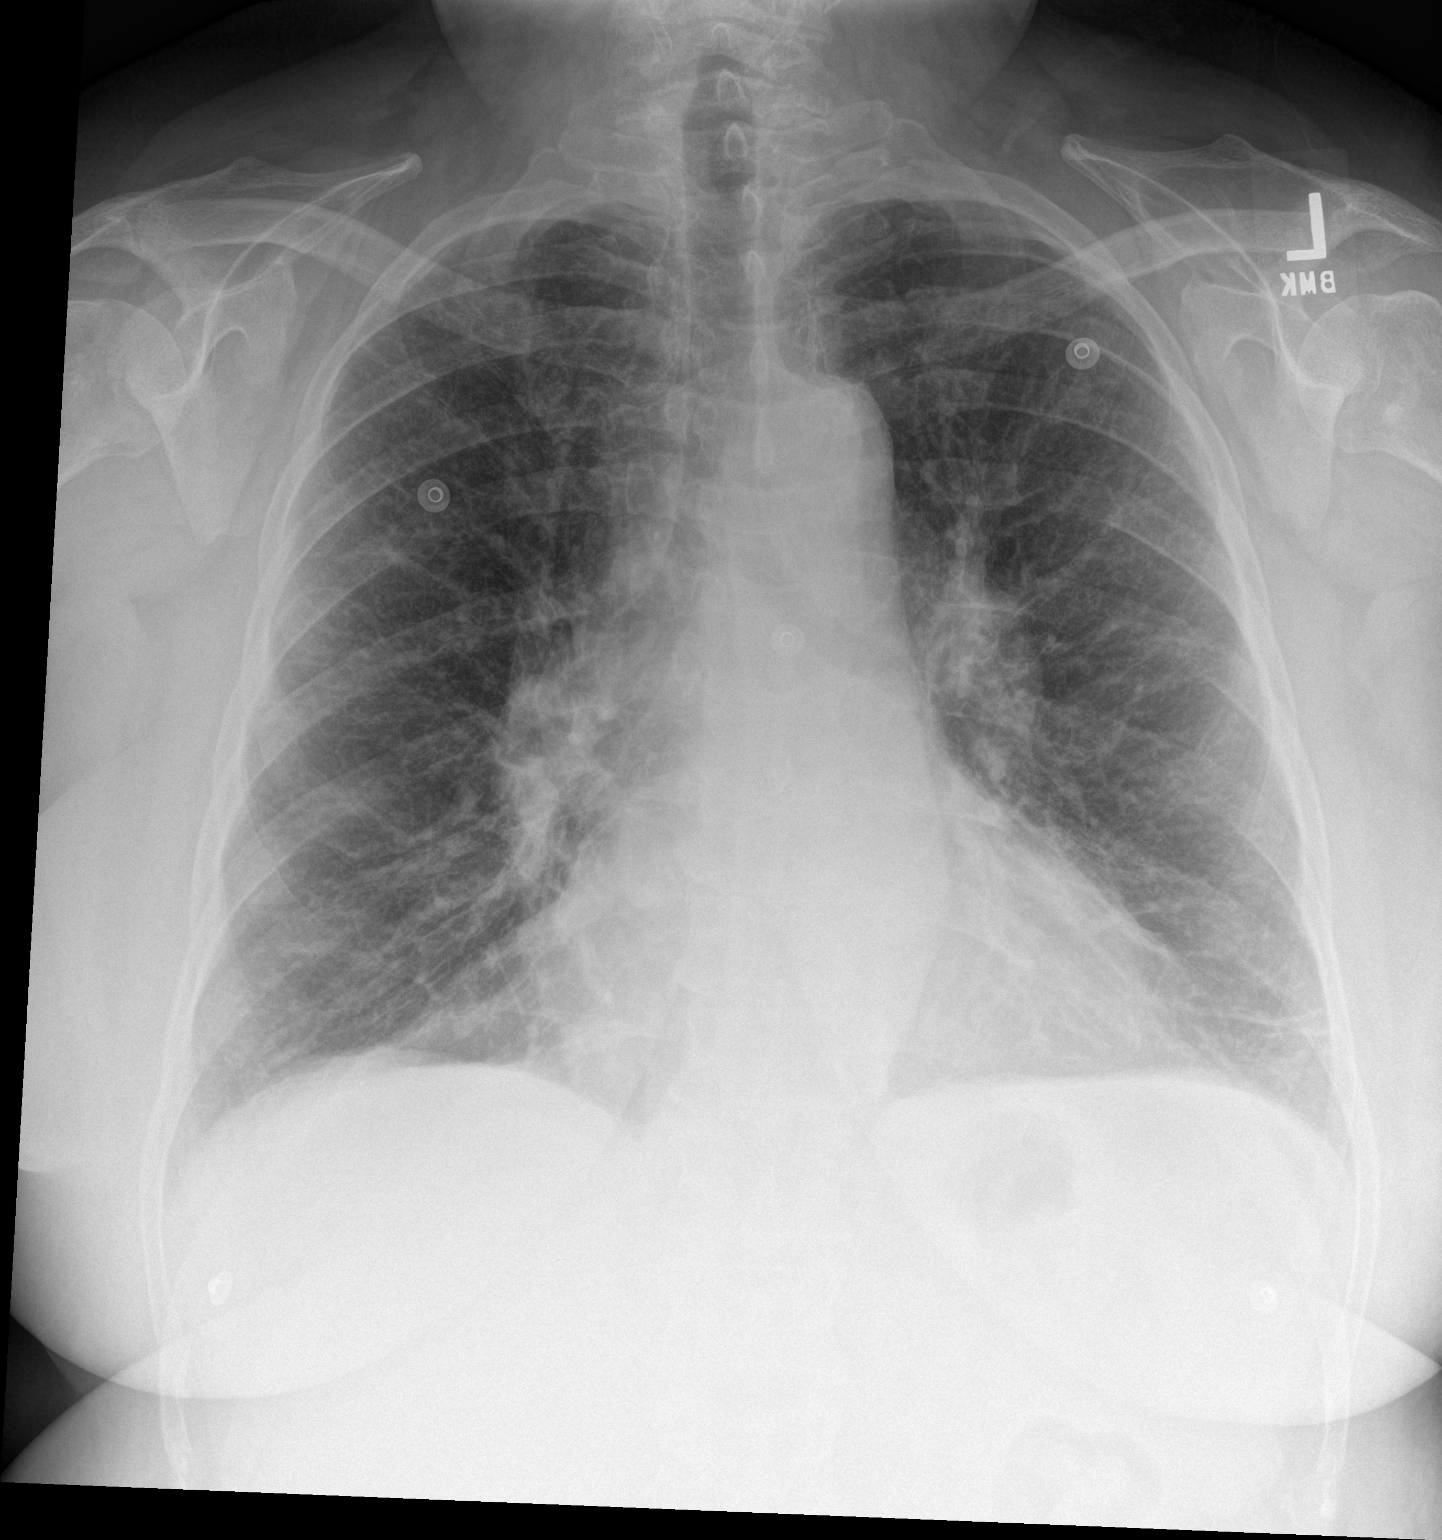
[im 2/2]
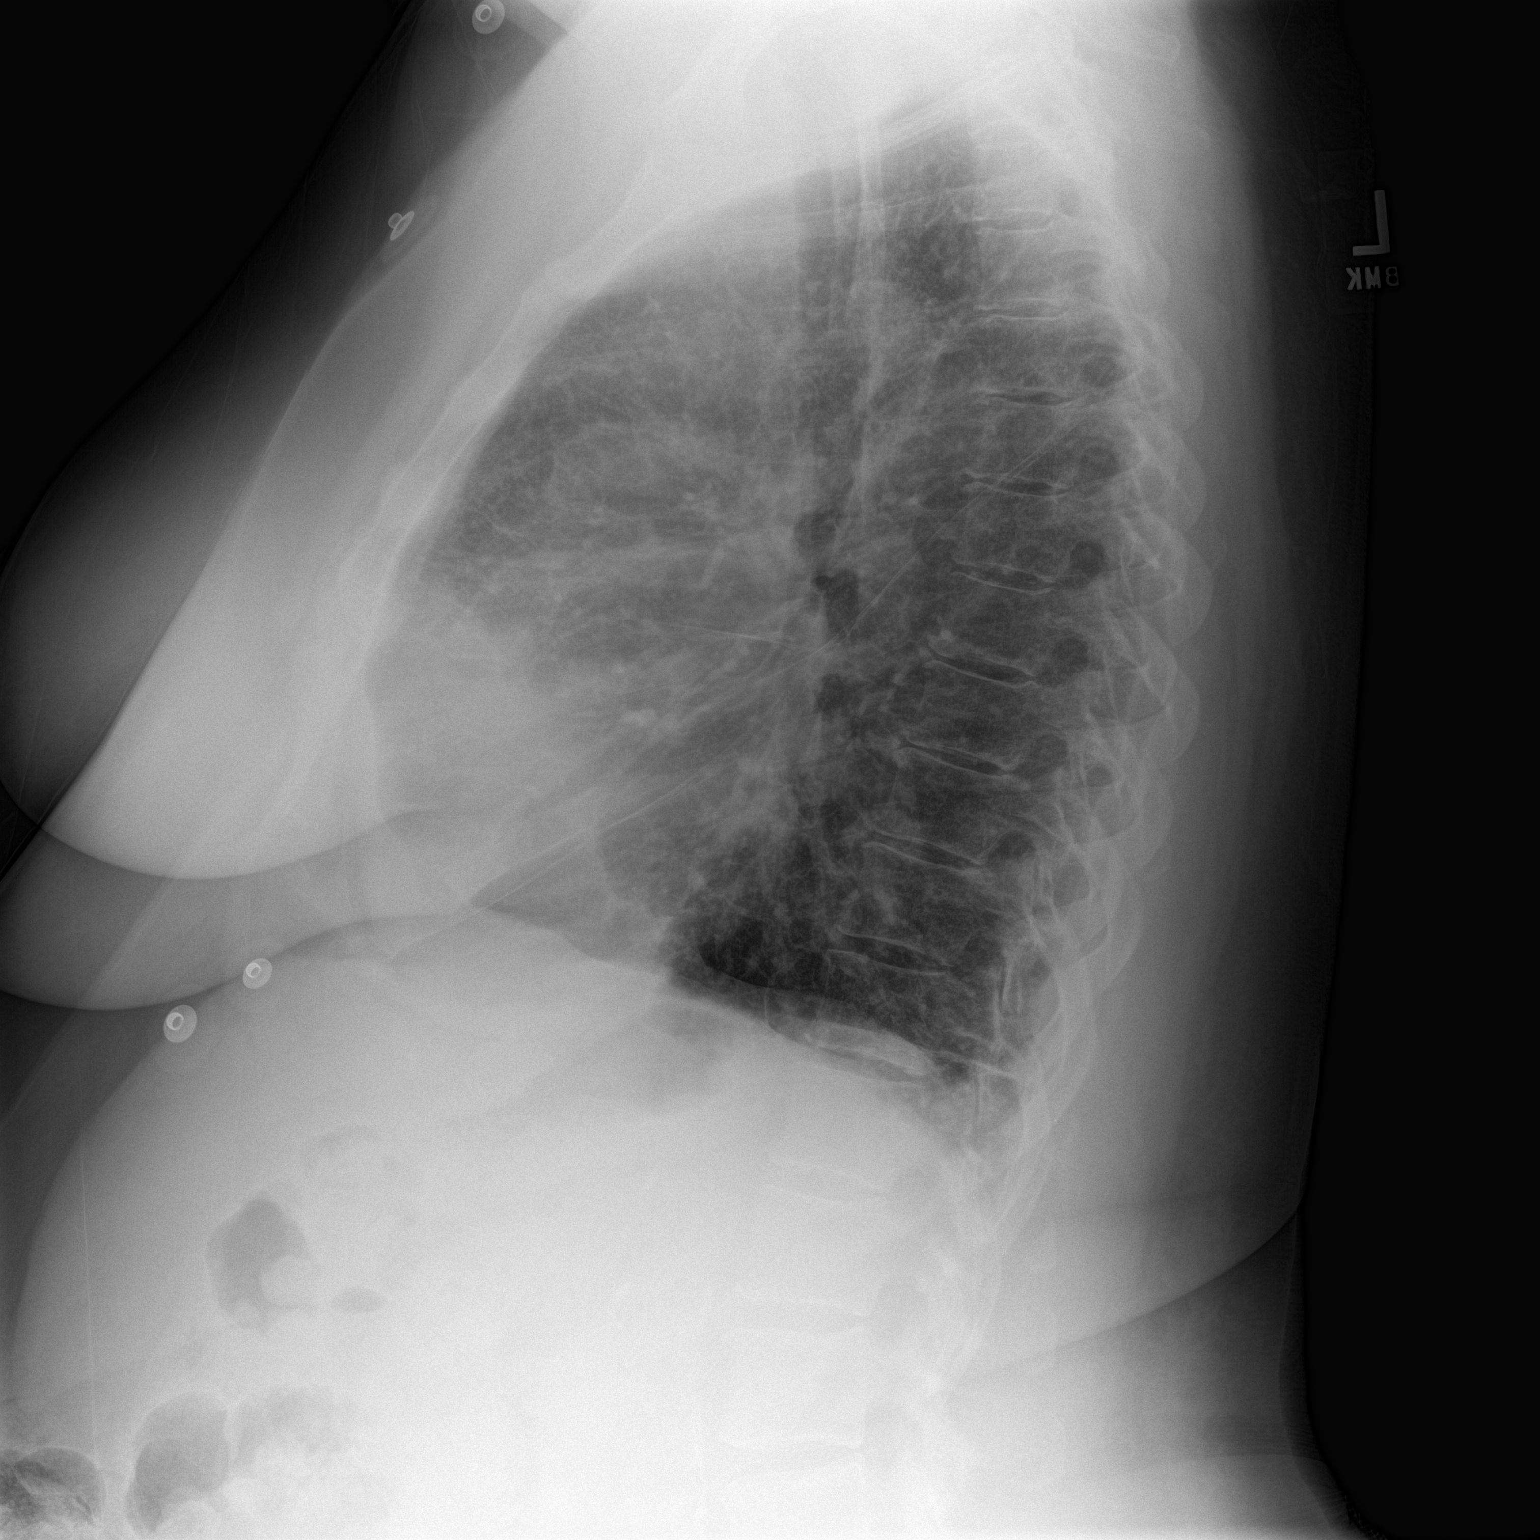

[2 of 2 positions shown; findings below may reference images not displayed]

FINDINGS: Borderline heart size with normal pulmonary vascularity. Diffuse
interstitial changes and peribronchial thickening likely
representing chronic bronchitis with fibrosis. Interstitial findings
have increased since previous study, suggesting superimposed mild
edema. No focal airspace disease or consolidation in the lungs. No
blunting of costophrenic angles. No pneumothorax. Degenerative
changes in the spine. No change since previous study.
IMPRESSION: Mild cardiac enlargement with of mild interstitial edema. Fibrosis
and chronic bronchitic changes in the lungs.

## 2014-12-22 IMAGING — MR MRI LUMBAR SPINE WITHOUT CONTRAST
4 of 5 series · 24 of 48 positions shown · non-contrast
Comparison: Plain films lumbar spine 0 is 10/23/2011 and MRI lumbar
spine 09/30/2010.

CLINICAL DATA: Low back and bilateral leg pain with occasional
numbness. Symptoms for 3 years.

EXAM:
MRI LUMBAR SPINE WITHOUT CONTRAST
TECHNIQUE: Multiplanar, multisequence MR imaging of the lumbar spine was
performed. No intravenous contrast was administered.

[Series 2: T2 · sagittal · 4.0mm · 0.81mm/px · 6 of 15 slices shown (1 of 2)]
[im 1/15]
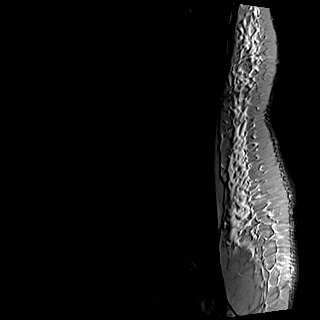
[im 3/15]
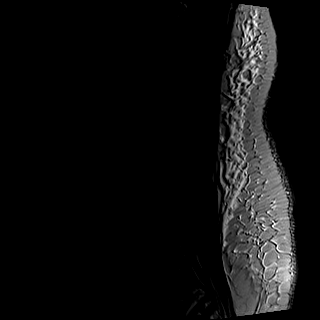
[im 6/15]
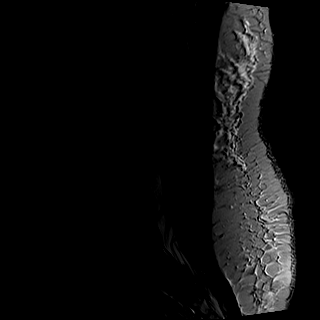
[im 9/15]
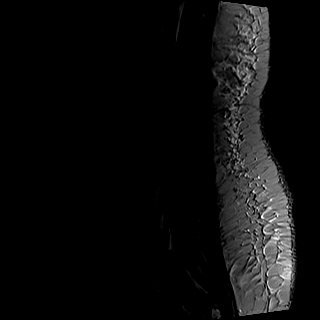
[im 12/15]
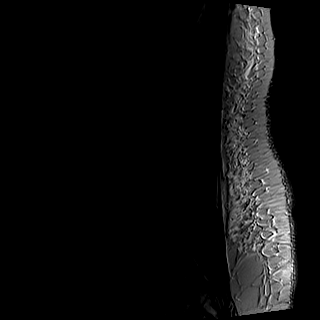
[im 15/15]
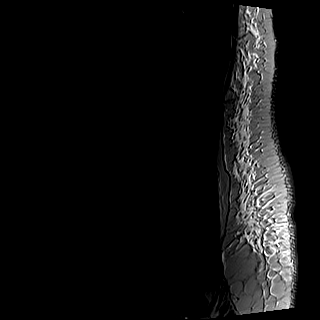

[Series 3: T1 · sagittal · 4.0mm · 0.41mm/px · 7 of 15 slices shown (1 of 2)]
[im 1/15]
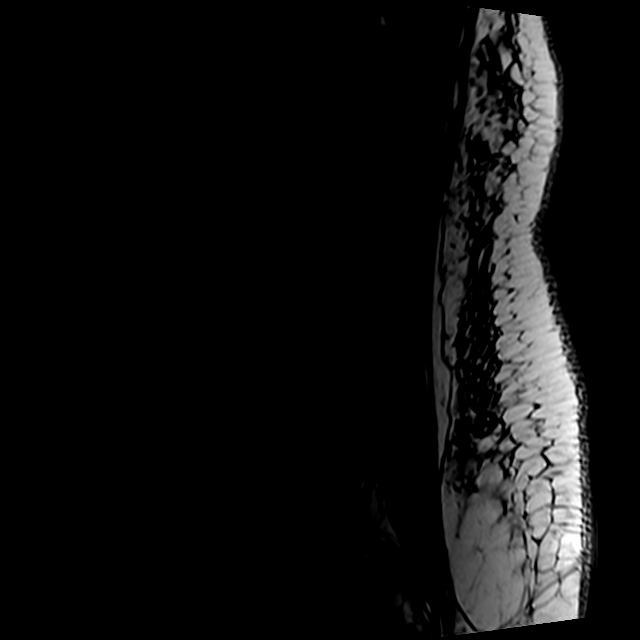
[im 3/15]
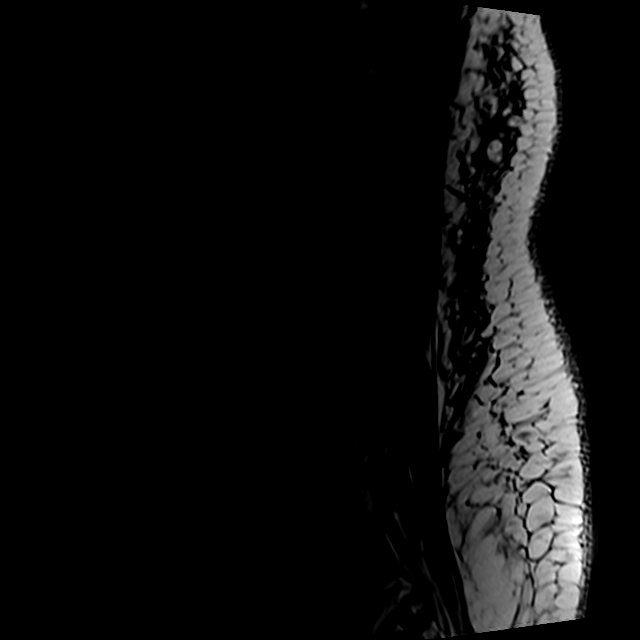
[im 5/15]
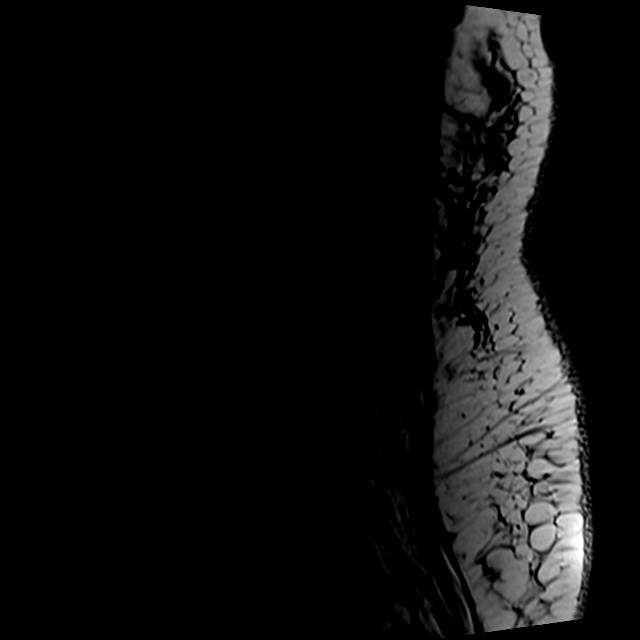
[im 8/15]
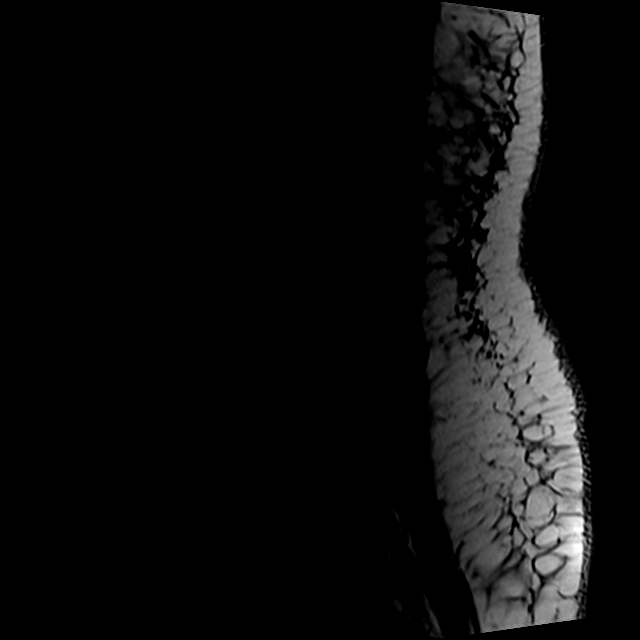
[im 10/15]
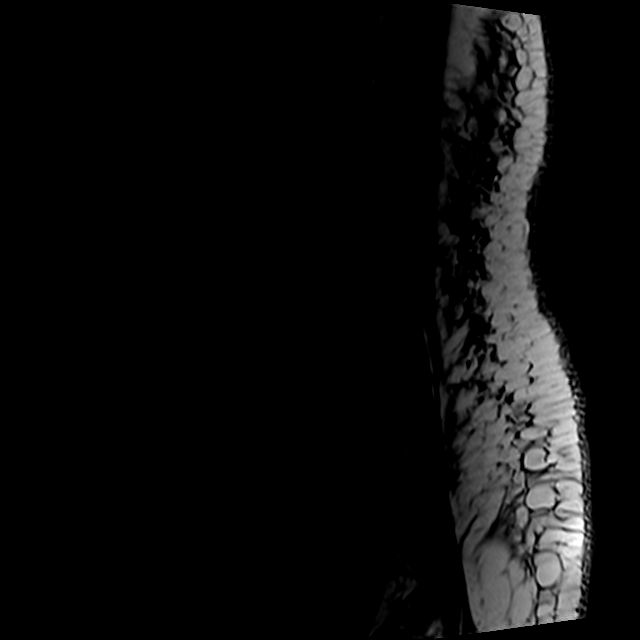
[im 12/15]
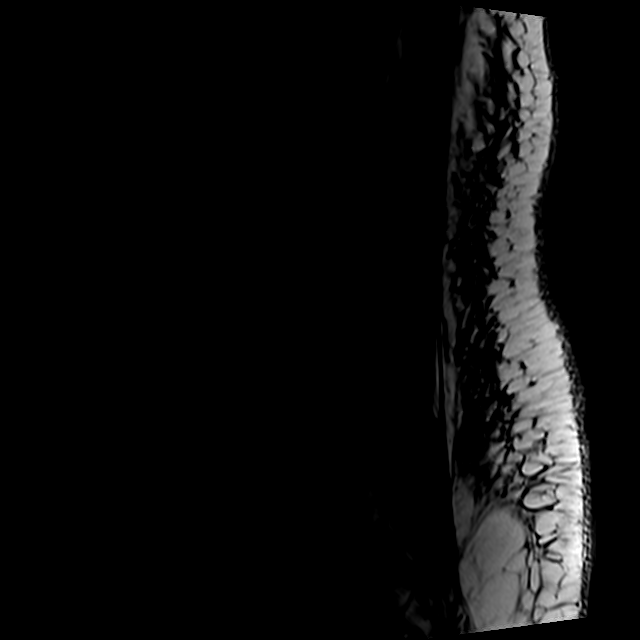
[im 15/15]
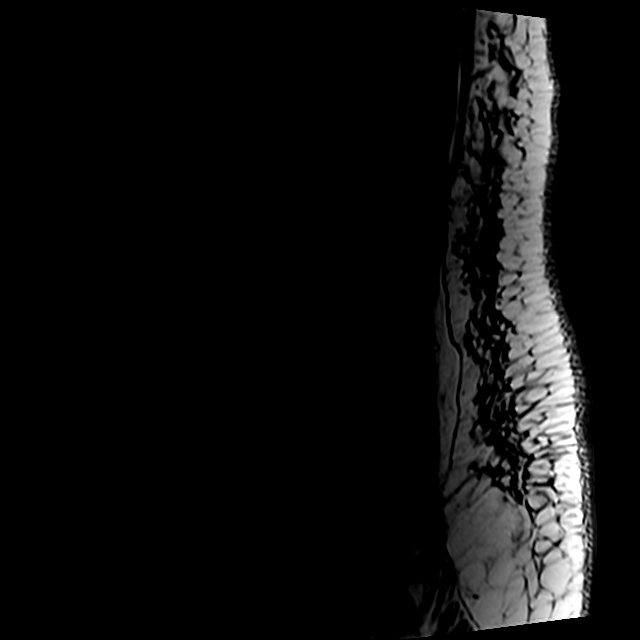

[Series 5: T2 · axial · 4.0mm · 0.78mm/px · z∈[-116,+58]mm · 8 of 32 slices shown (2 of 2)]
[im 1/32]
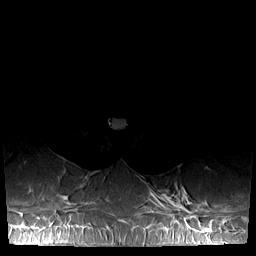
[im 5/32]
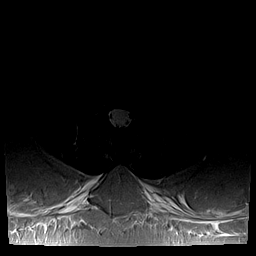
[im 10/32]
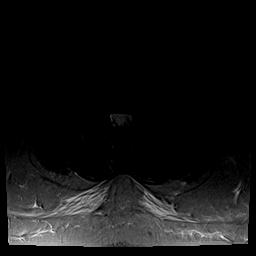
[im 15/32]
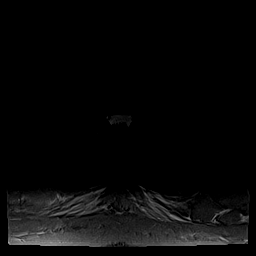
[im 17/32]
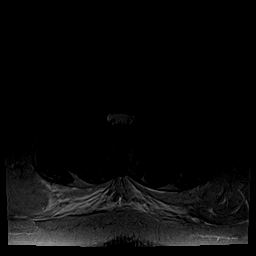
[im 22/32]
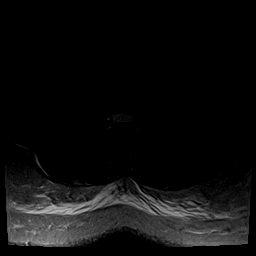
[im 27/32]
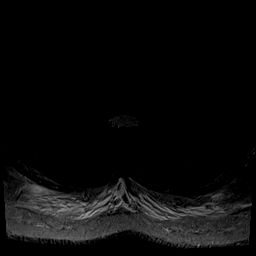
[im 32/32]
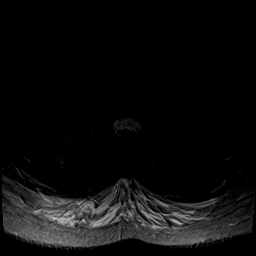

[Series 103: T1 · axial · 4.0mm · 0.31mm/px · z∈[-97,+33]mm · 3 of 32 slices shown (2 of 2)]
[im 5/32]
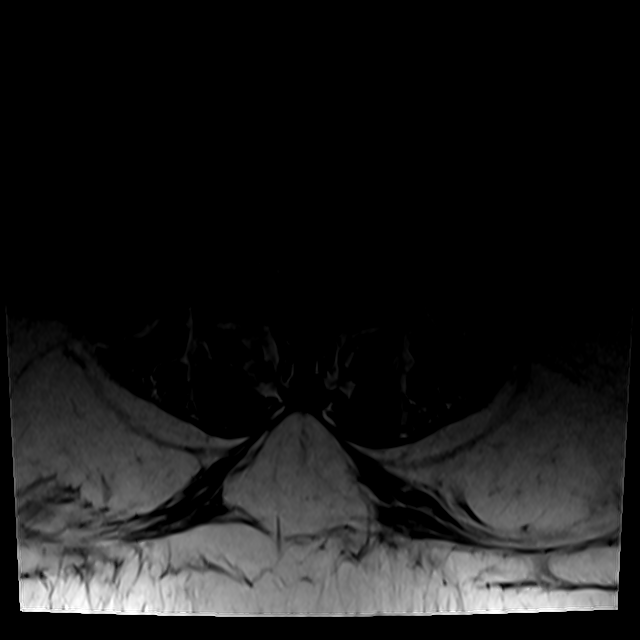
[im 17/32]
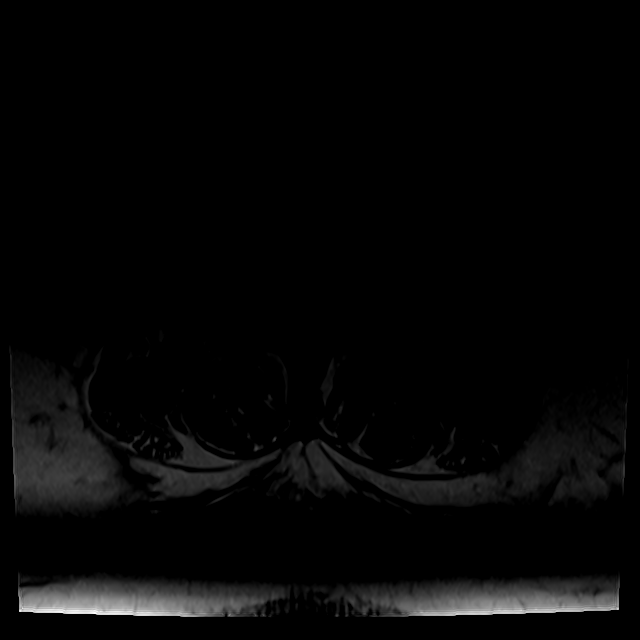
[im 27/32]
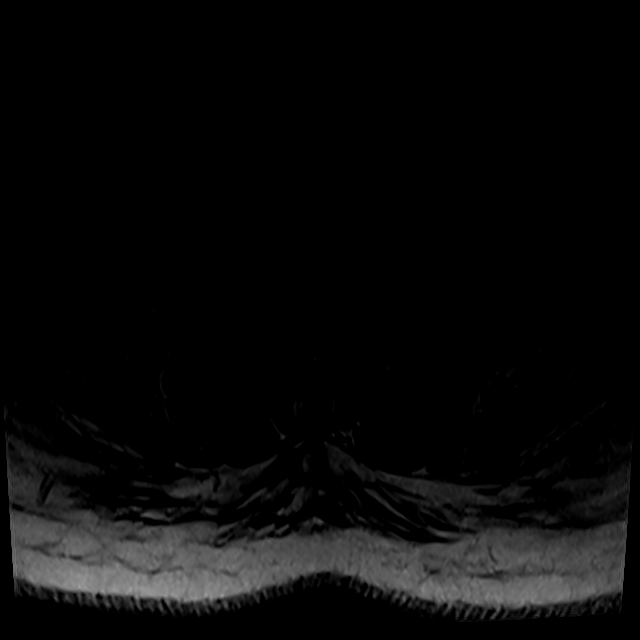

[24 of 48 positions shown; findings below may reference images not displayed]

FINDINGS: Vertebral body height and alignment are maintained. There is no
worrisome marrow lesion. Scattered hemangiomas are seen, largest in
T12. The conus medullaris is normal in signal and position. Imaged
intra-abdominal contents are unremarkable. The T11-12 and T12-L1
levels are imaged in the sagittal plane only and negative.

L1-2:  Negative.

L2-3:  Negative.

L3-4: Shallow disc bulge without central canal or foraminal
stenosis.

L4-5: Shallow disc bulge endplate spur. The central canal is open.
Neural foramina are mildly narrowed. The appearance is unchanged.

L5-S1: Shallow disc bulge without central canal or foraminal
narrowing.
IMPRESSION: No change in the appearance of mild spondylosis most notable at L4-5
where a disc bulge causes mild foraminal narrowing. No nerve root
compression is identified.

## 2014-12-25 IMAGING — CR DG ABDOMEN 2V
1 series · 3 of 3 positions shown · non-contrast
Comparison: Chest 01/15/2014

CLINICAL DATA: Abdominal swelling and pain for several months.
Intense pain for 1 week. Nausea and diarrhea today.

EXAM:
ABDOMEN - 2 VIEW

[Series 1: w abdomen upright · 0.14mm/px · 3 of 3 slices shown]
[im 1/3]
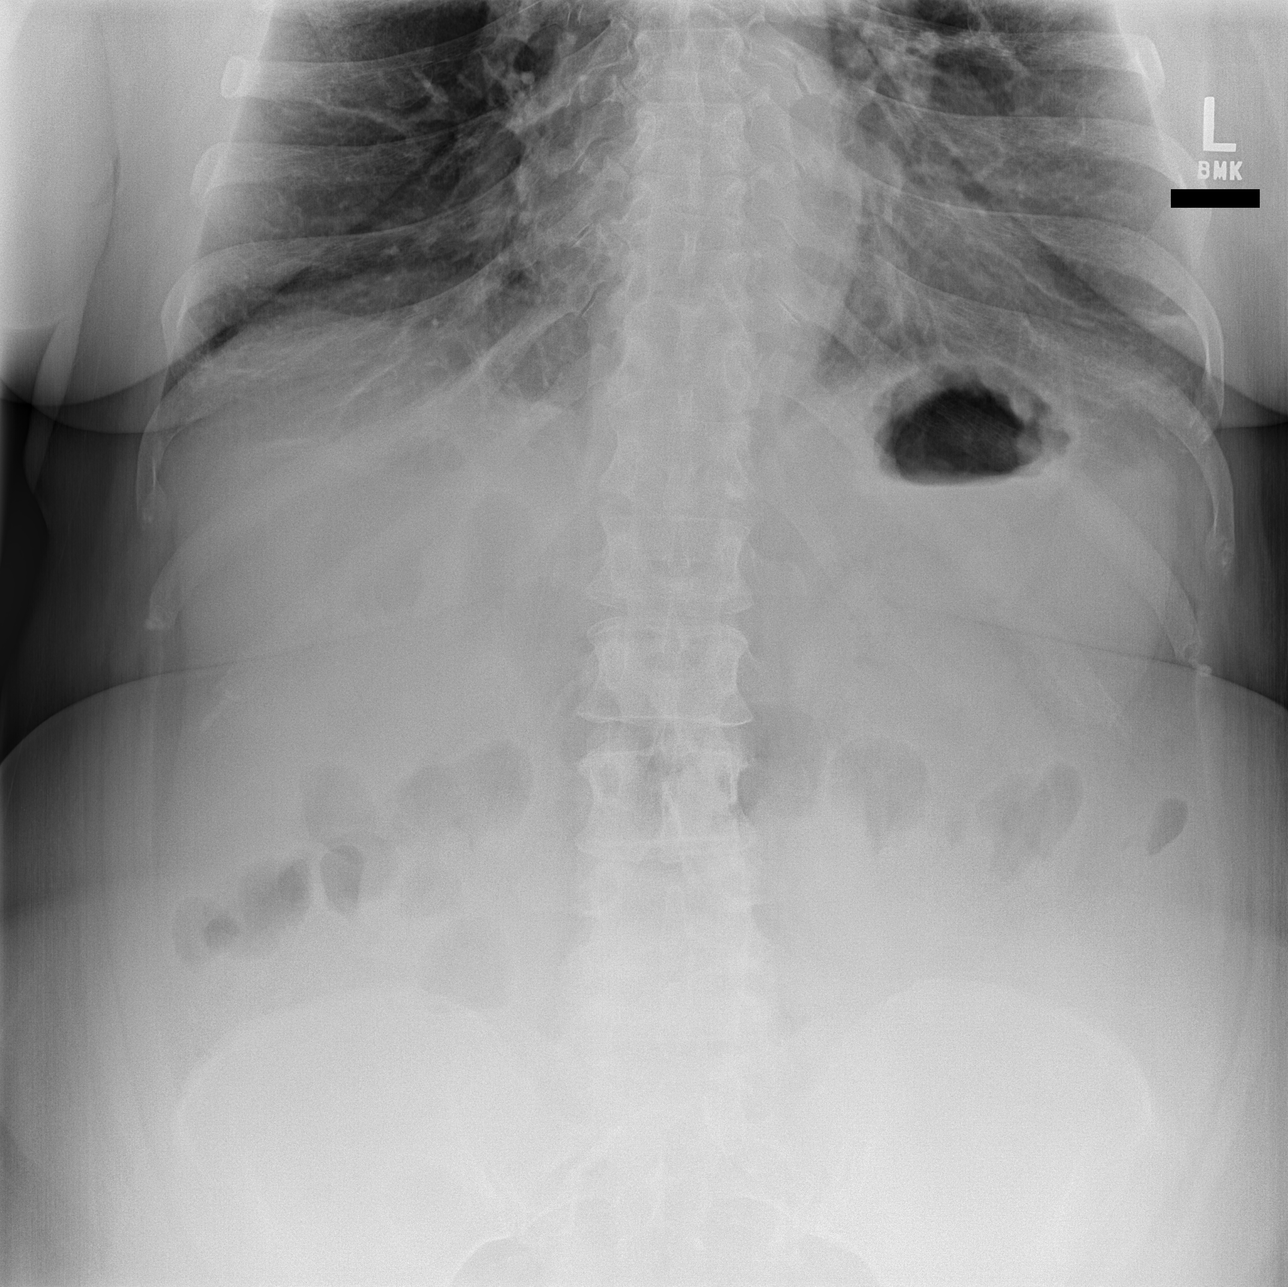
[im 2/3]
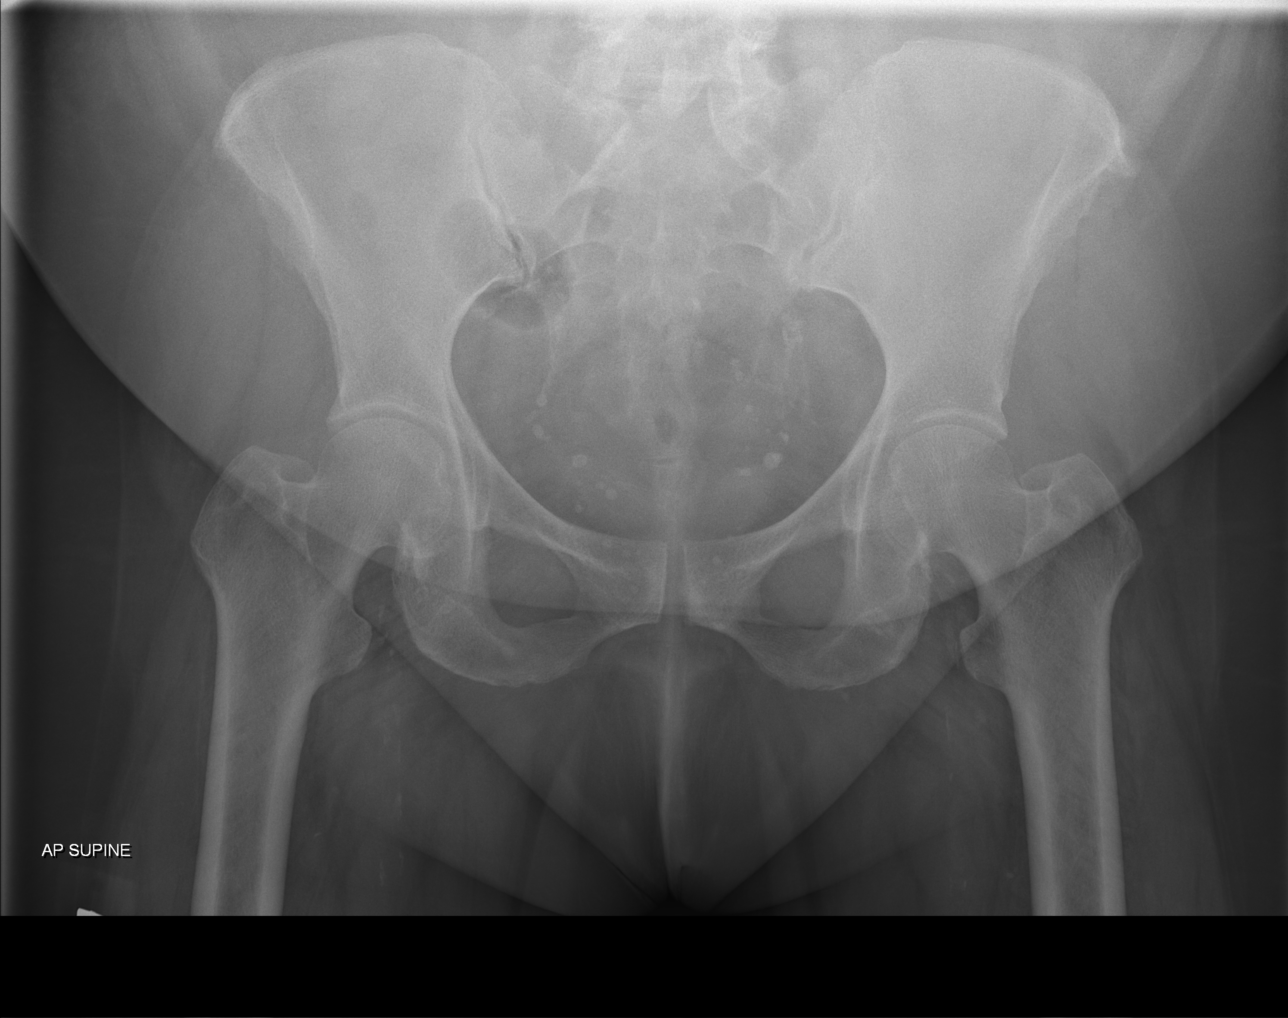
[im 3/3]
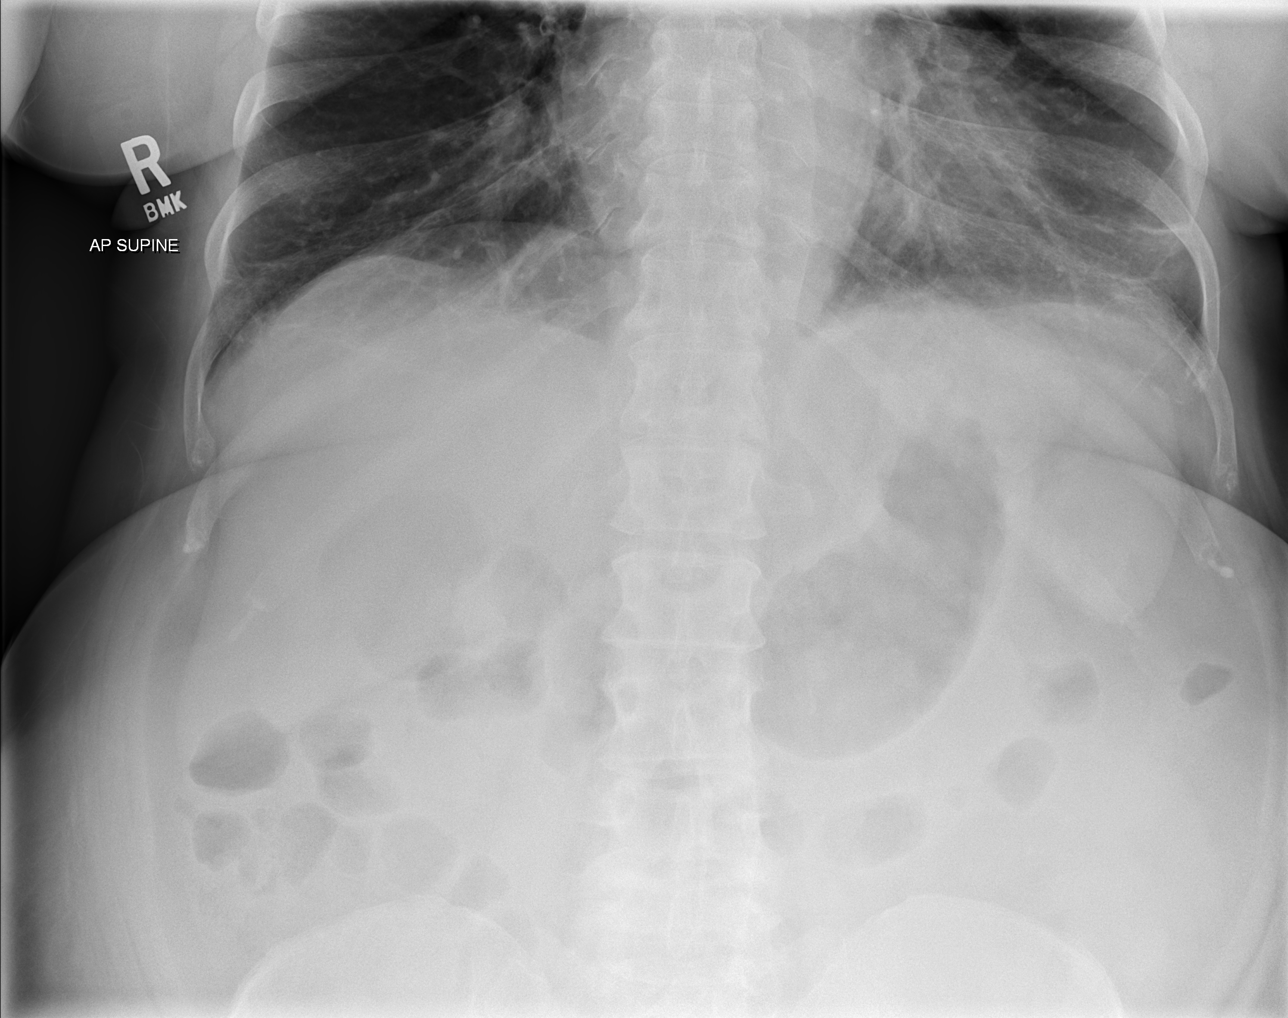

[3 of 3 positions shown; findings below may reference images not displayed]

FINDINGS: The bowel gas pattern is normal. There is no evidence of free air.
No radio-opaque calculi or other significant radiographic
abnormality is seen. Vascular calcifications.
IMPRESSION: Nonobstructive bowel gas pattern.

## 2015-01-12 IMAGING — US ABDOMEN ULTRASOUND
1 series · 13 of 25 positions shown · non-contrast
Comparison: 01/30/2008

CLINICAL DATA: Epigastric and upper abdominal pain.

EXAM:
ULTRASOUND ABDOMEN COMPLETE

[Series 1: abdomen ultrasound · 0.27mm/px · 13 of 109 slices shown]
[im 1/109]
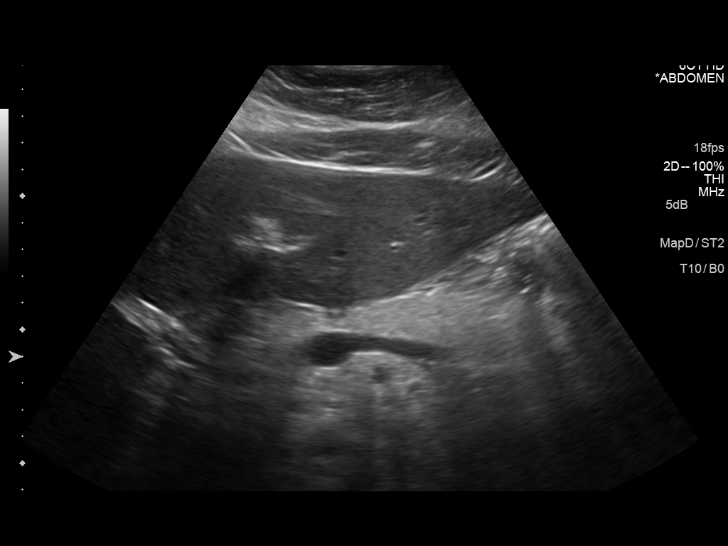
[im 10/109]
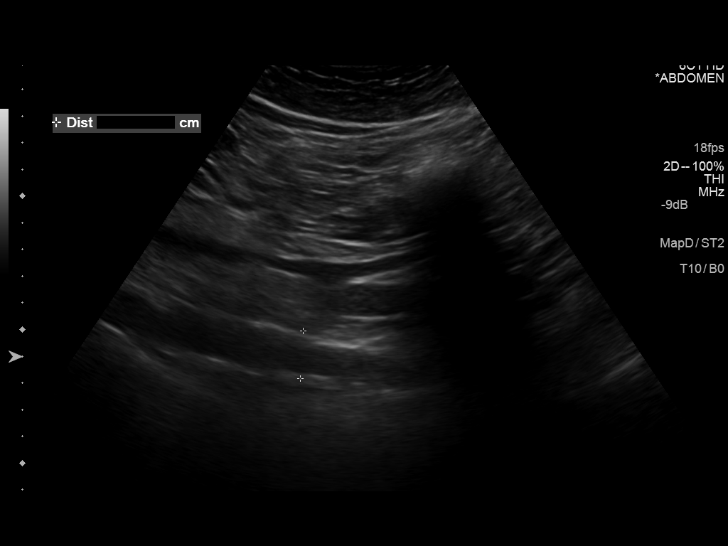
[im 19/109]
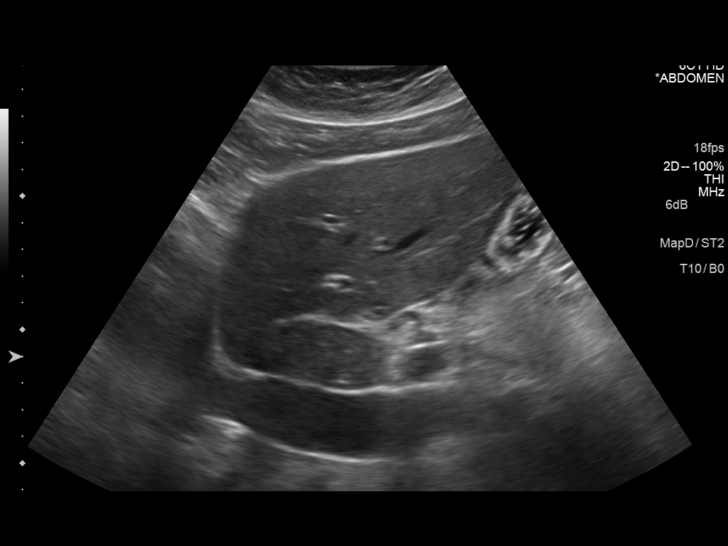
[im 28/109]
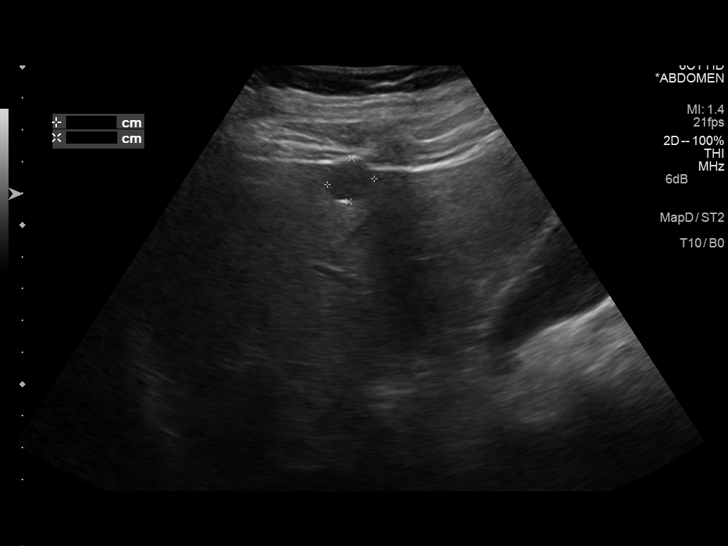
[im 37/109]
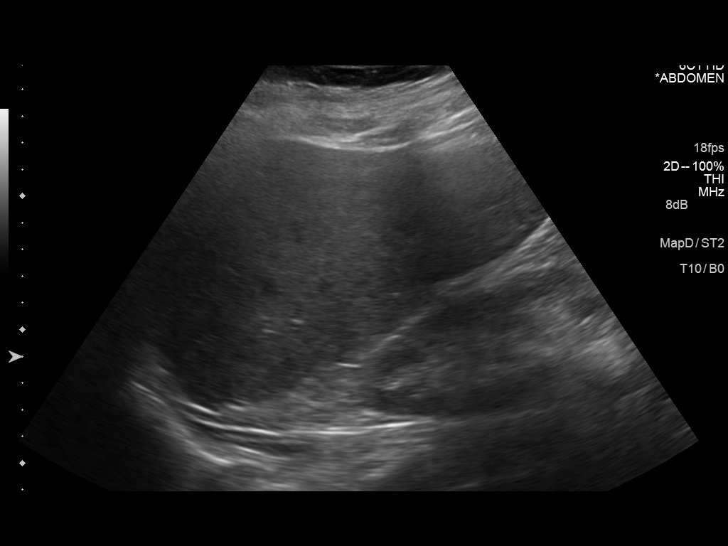
[im 46/109]
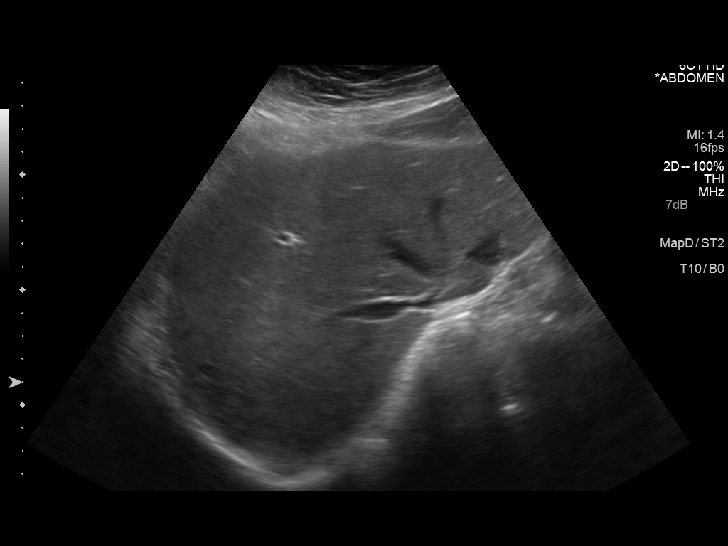
[im 55/109]
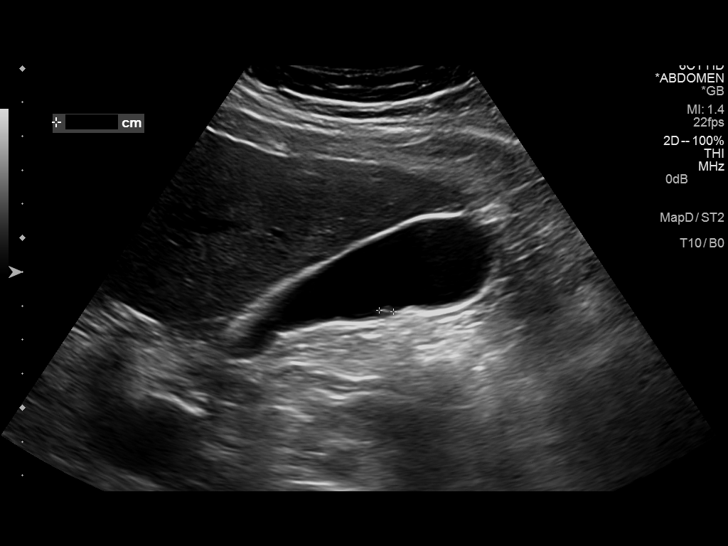
[im 64/109]
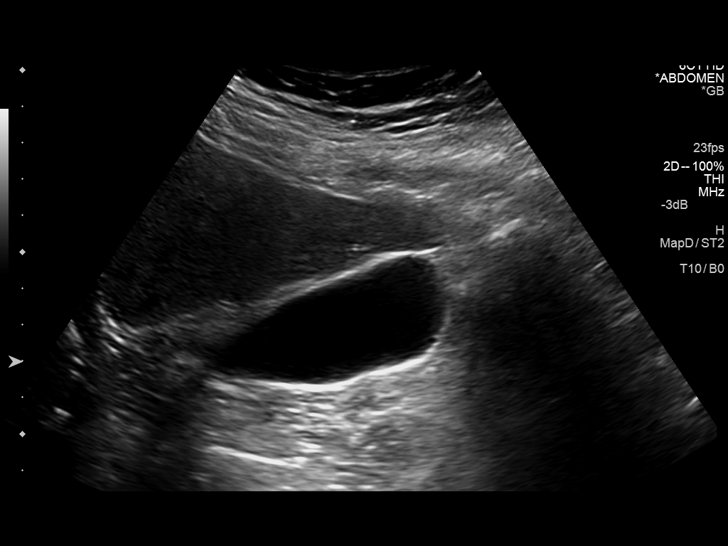
[im 73/109]
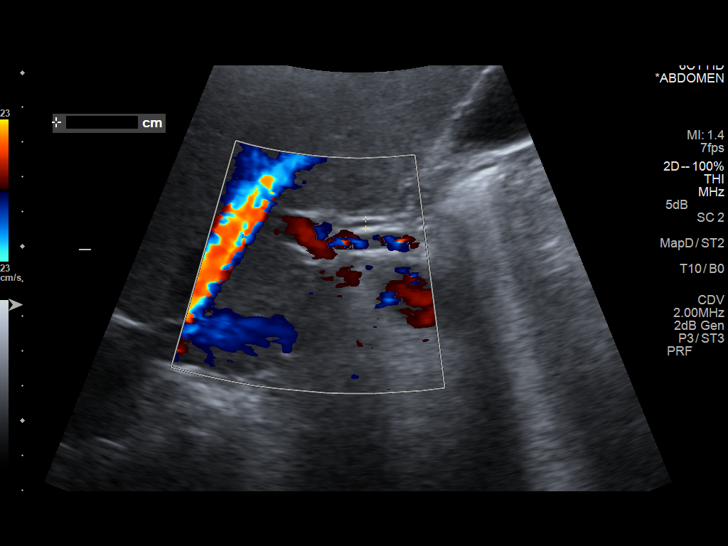
[im 82/109]
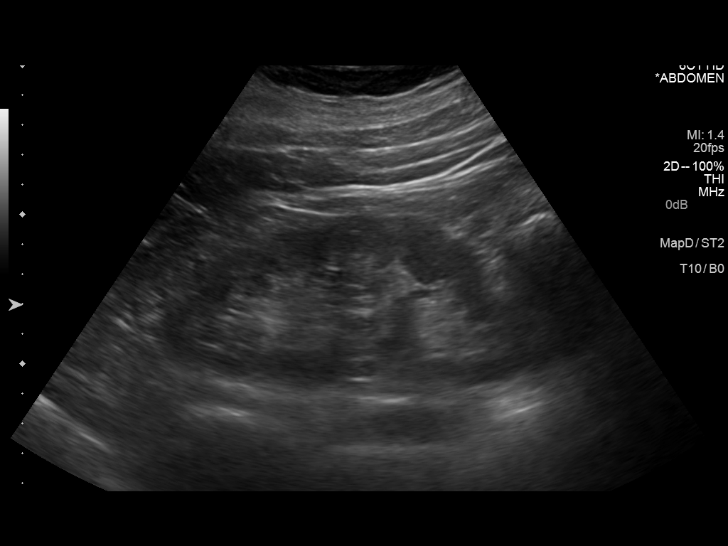
[im 91/109]
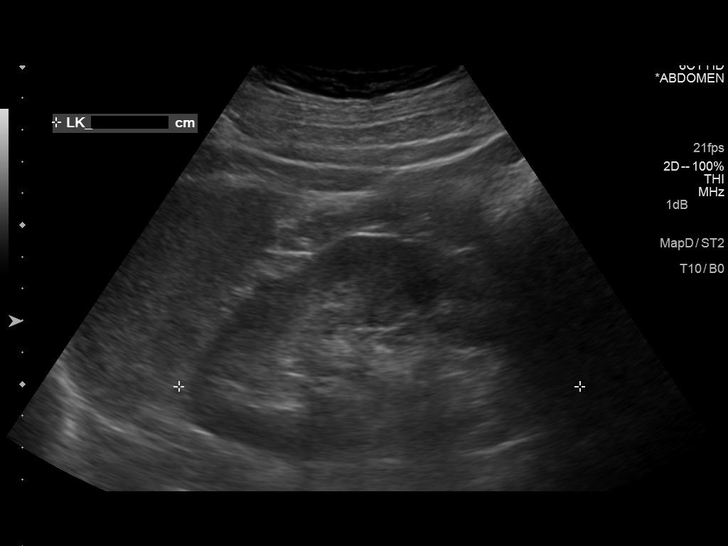
[im 100/109]
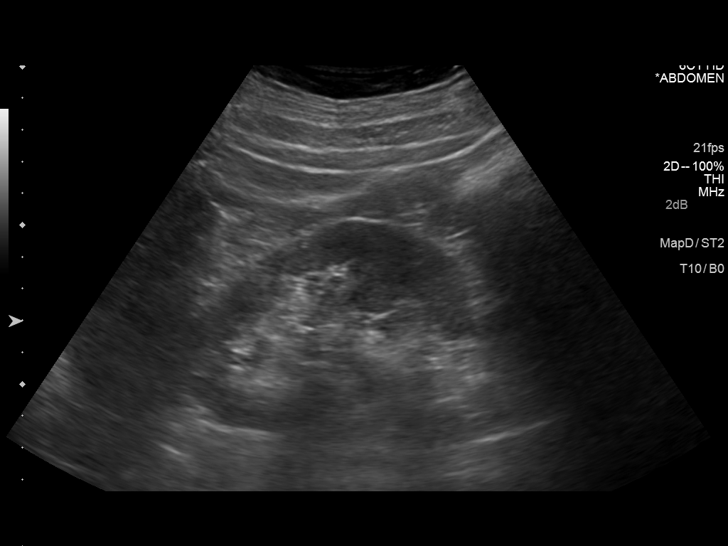
[im 109/109]
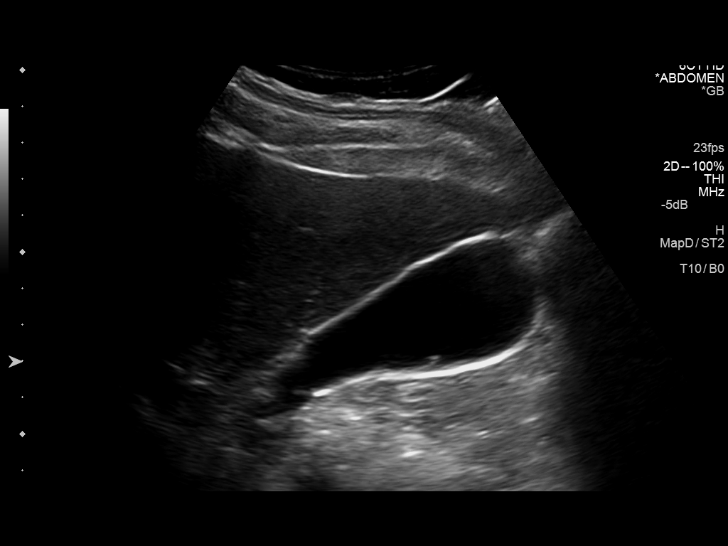

[13 of 25 positions shown; findings below may reference images not displayed]

FINDINGS: Gallbladder:

4 mm non shadowing echogenic nonmobile lesion along the posterior
margin of the gallbladder, image 61, compatible with polyp.
Sonographic Murphy's sign absent. No gallbladder wall thickening or
pericholecystic fluid.

Common bile duct:

Diameter: 3 mm

Liver:

Anterior right hepatic lobe 1.5 cm cystic lesion with enhance
through transmission and faint internal echoes. No associated
Doppler flow internally. The other small cysts shown on the prior CT
scan are not readily apparent.

IVC:

No abnormality visualized.

Pancreas:

Visualized portion unremarkable.

Spleen:

Size and appearance within normal limits.

Right Kidney:

Length: 12.7 cm. Echogenicity within normal limits. No mass or
hydronephrosis visualized.

Left Kidney:

Length: 12.6 cm. 1 cm cyst in the left kidney upper pole, without
internal vascular flow observed.

Abdominal aorta:

No aneurysm visualized.  Atherosclerosis noted distally.

Other findings:

None.
IMPRESSION: 1. Benign-appearing cyst in the liver and left kidney.
2. 4 mm polyp along the posterior wall of the gallbladder.
3. Atherosclerotic calcification distally in the abdominal aorta.
4. A specific cause for epigastric and upper abdominal pain is not
identified.

## 2015-01-30 ENCOUNTER — Other Ambulatory Visit: Payer: Self-pay | Admitting: Urology

## 2015-01-30 DIAGNOSIS — N281 Cyst of kidney, acquired: Secondary | ICD-10-CM

## 2015-02-01 ENCOUNTER — Observation Stay
Admit: 2015-02-01 | Disposition: A | Discharge status: Home / Self Care | Payer: Self-pay | Attending: Internal Medicine | Admitting: Internal Medicine

## 2015-02-01 LAB — CBC
HCT: 48.6 % — ABNORMAL HIGH (ref 35.0–47.0)
HGB: 16.3 g/dL — ABNORMAL HIGH (ref 12.0–16.0)
MCH: 32.7 pg (ref 26.0–34.0)
MCHC: 33.4 g/dL (ref 32.0–36.0)
MCV: 98 fL (ref 80–100)
Platelet: 154 10*3/uL (ref 150–440)
RBC: 4.97 10*6/uL (ref 3.80–5.20)
RDW: 14.7 % — ABNORMAL HIGH (ref 11.5–14.5)
WBC: 10.3 10*3/uL (ref 3.6–11.0)

## 2015-02-01 LAB — BASIC METABOLIC PANEL
Anion Gap: 6 — ABNORMAL LOW (ref 7–16)
BUN: 17 mg/dL
Calcium, Total: 9.4 mg/dL
Chloride: 102 mmol/L
Co2: 32 mmol/L
Creatinine: 0.65 mg/dL
EGFR (African American): >60
EGFR (Non-African Amer.): >60
Glucose: 133 mg/dL — ABNORMAL HIGH
Potassium: 3.8 mmol/L
Sodium: 140 mmol/L

## 2015-02-01 LAB — PRO B NATRIURETIC PEPTIDE: B-Type Natriuretic Peptide: 107 pg/mL — ABNORMAL HIGH

## 2015-02-01 LAB — TROPONIN I: Troponin-I: <0.03 ng/mL

## 2015-02-02 LAB — BASIC METABOLIC PANEL
Anion Gap: 6 — ABNORMAL LOW (ref 7–16)
BUN: 19 mg/dL
Calcium, Total: 8.7 mg/dL — ABNORMAL LOW
Chloride: 105 mmol/L
Co2: 30 mmol/L
Creatinine: 0.7 mg/dL
EGFR (African American): >60
EGFR (Non-African Amer.): >60
Glucose: 108 mg/dL — ABNORMAL HIGH
Potassium: 3.9 mmol/L
Sodium: 141 mmol/L

## 2015-02-02 LAB — CBC WITH DIFFERENTIAL/PLATELET
Basophil #: 0 10*3/uL (ref 0.0–0.1)
Basophil %: 0.4 %
Eosinophil #: 0.2 10*3/uL (ref 0.0–0.7)
Eosinophil %: 2.6 %
HCT: 46.9 % (ref 35.0–47.0)
HGB: 15.2 g/dL (ref 12.0–16.0)
Lymphocyte #: 1.9 10*3/uL (ref 1.0–3.6)
Lymphocyte %: 21.5 %
MCH: 31.8 pg (ref 26.0–34.0)
MCHC: 32.3 g/dL (ref 32.0–36.0)
MCV: 98 fL (ref 80–100)
Monocyte #: 0.7 x10 3/mm (ref 0.2–0.9)
Monocyte %: 8 %
Neutrophil #: 5.9 10*3/uL (ref 1.4–6.5)
Neutrophil %: 67.5 %
Platelet: 145 10*3/uL — ABNORMAL LOW (ref 150–440)
RBC: 4.77 10*6/uL (ref 3.80–5.20)
RDW: 14.8 % — ABNORMAL HIGH (ref 11.5–14.5)
WBC: 8.8 10*3/uL (ref 3.6–11.0)

## 2015-02-02 LAB — TROPONIN I
Troponin-I: <0.03 ng/mL
Troponin-I: <0.03 ng/mL

## 2015-02-02 IMAGING — CT CT ABD-PELV W/ CM
2 of 5 series · 16 of 46 positions shown, 18 images · IV contrast (isovue)
Comparison: None

CLINICAL DATA: Patient has head bloating and general abdominal pain
since October 2013. History of COPD. On home O2.

EXAM:
CT ABDOMEN AND PELVIS WITH CONTRAST
TECHNIQUE: Multidetector CT imaging of the abdomen and pelvis was performed
using the standard protocol following bolus administration of
intravenous contrast.
CONTRAST:  100 cc of Isovue 300

[Series 2: routine abd pel with · axial · 0.78mm/px · z∈[-935,-560]mm · 13 of 85 slices shown, 15 images]
[im 5/85  soft-tissue]
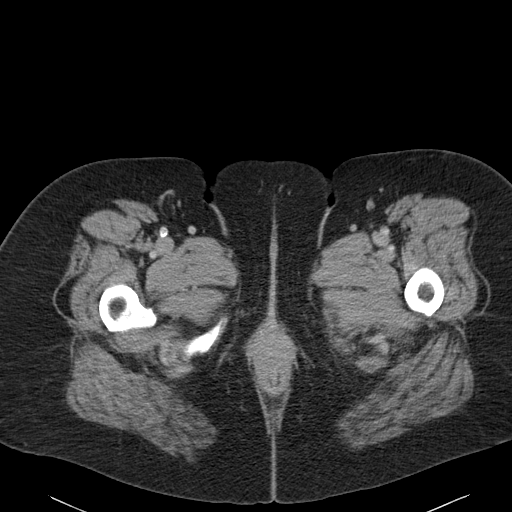
[im 5/85  bone]
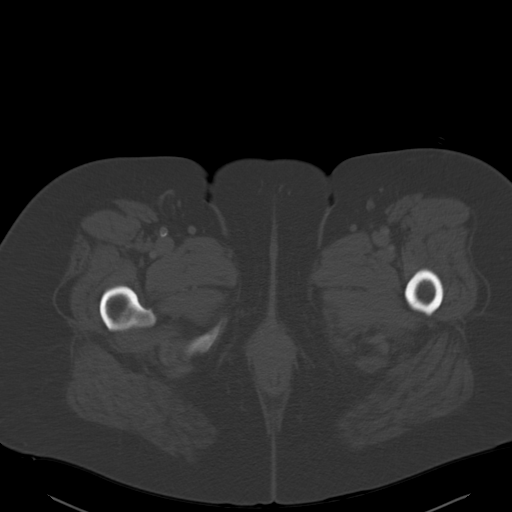
[im 14/85  soft-tissue]
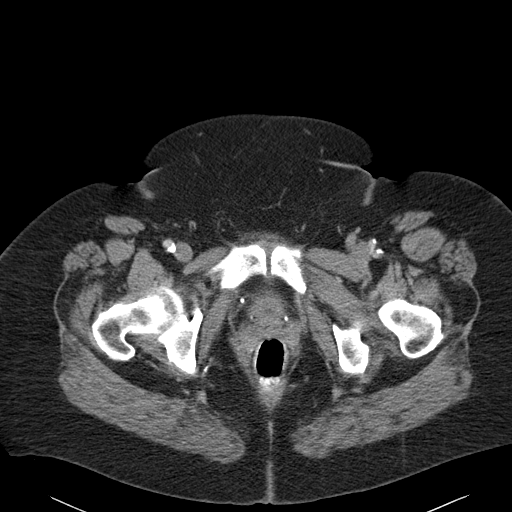
[im 18/85  soft-tissue]
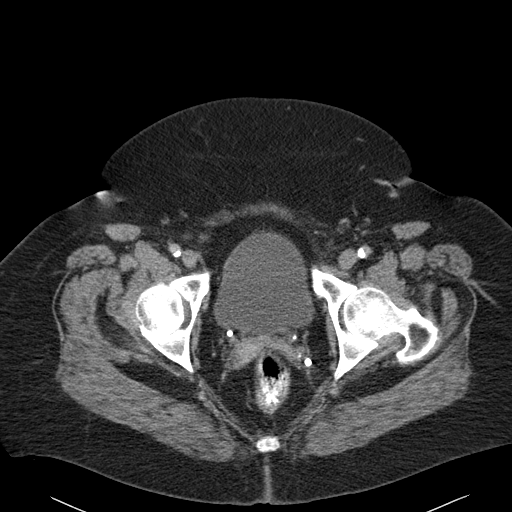
[im 23/85  soft-tissue]
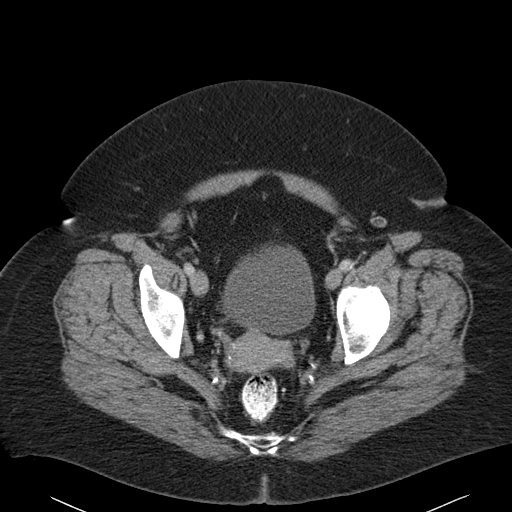
[im 31/85  soft-tissue]
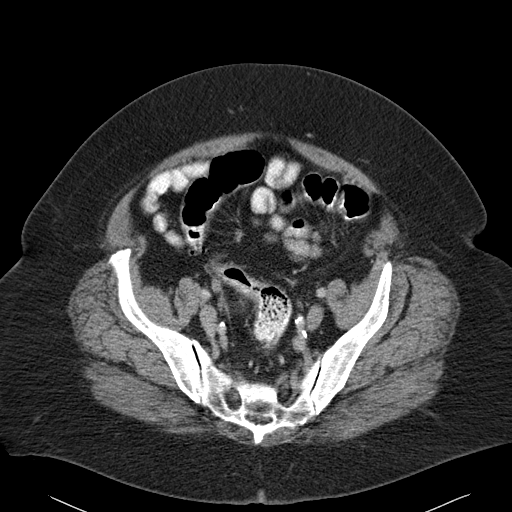
[im 36/85  soft-tissue]
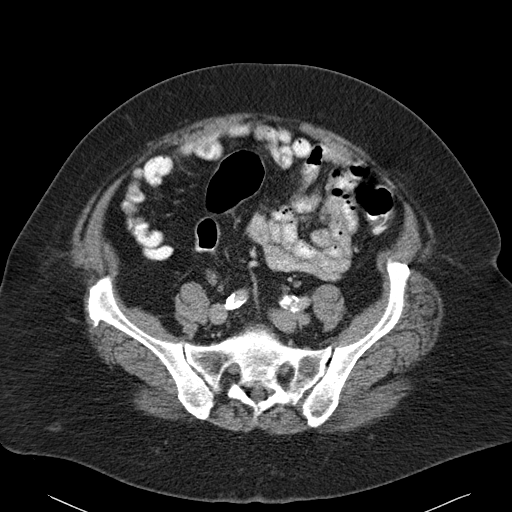
[im 45/85  soft-tissue]
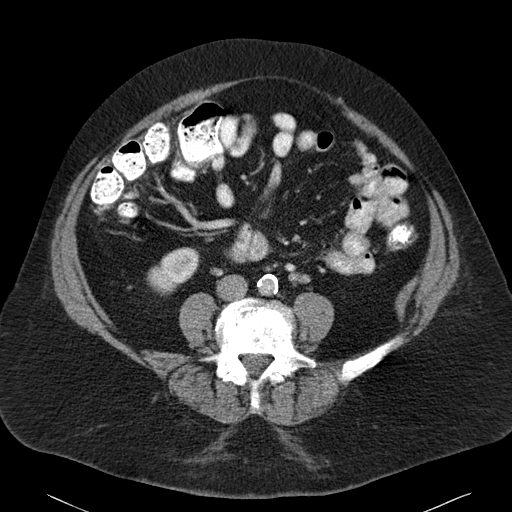
[im 49/85  soft-tissue]
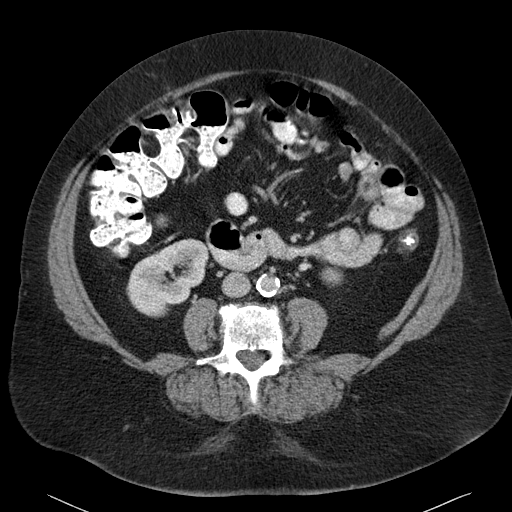
[im 54/85  soft-tissue]
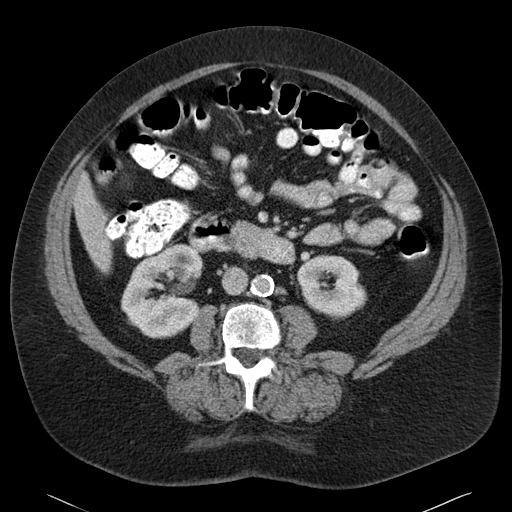
[im 54/85  bone]
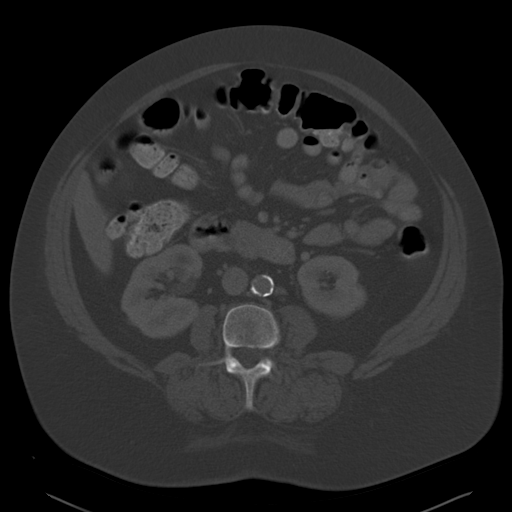
[im 62/85  soft-tissue]
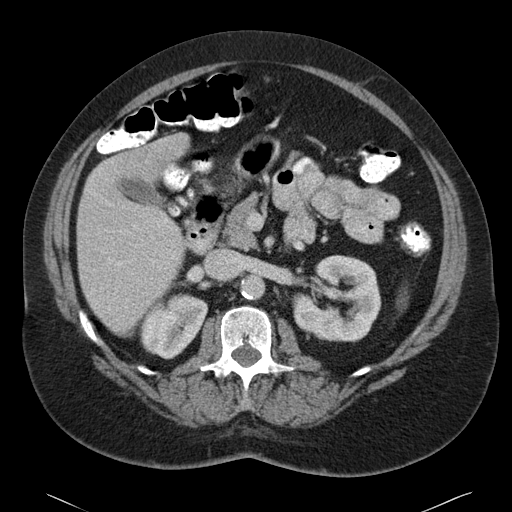
[im 67/85  soft-tissue]
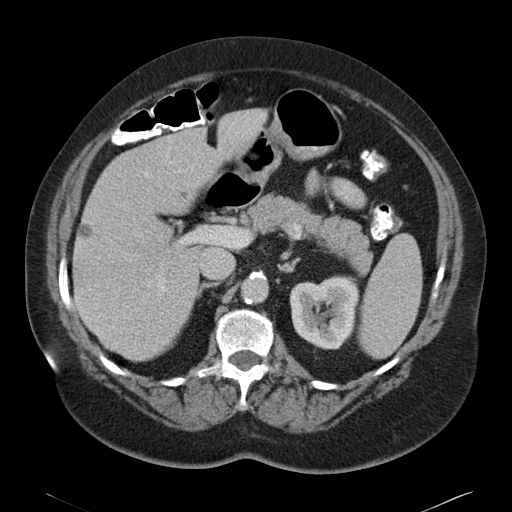
[im 71/85  soft-tissue]
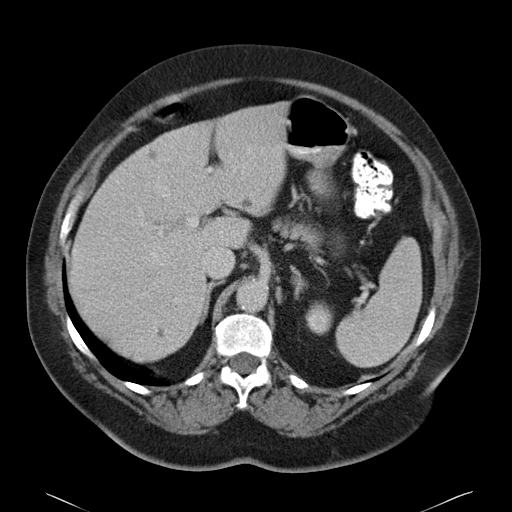
[im 80/85  soft-tissue]
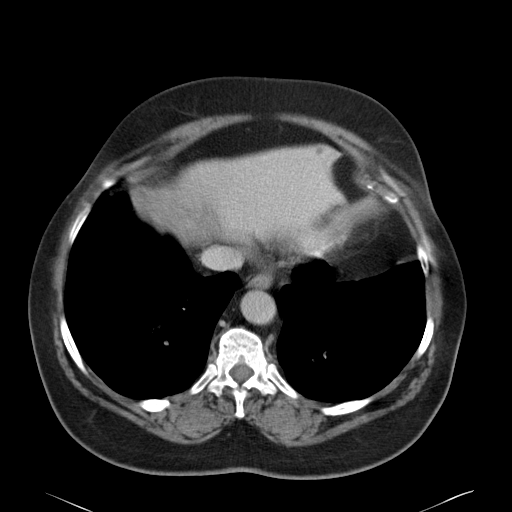

[Series 7: cor routine abd pel with · coronal · 0.79mm/px · 3 of 184 slices shown]
[im 62/184  soft-tissue]
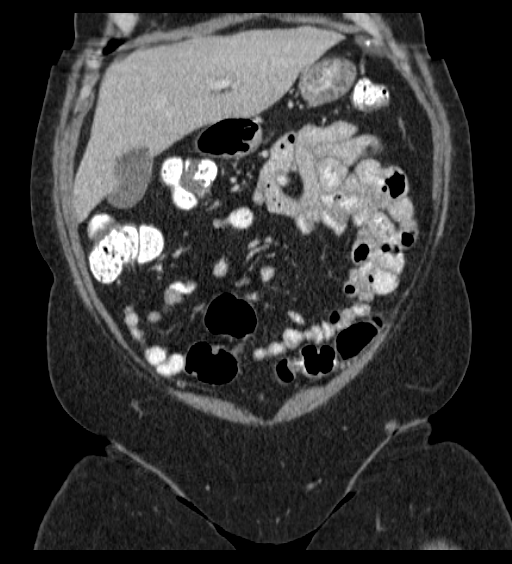
[im 82/184  soft-tissue]
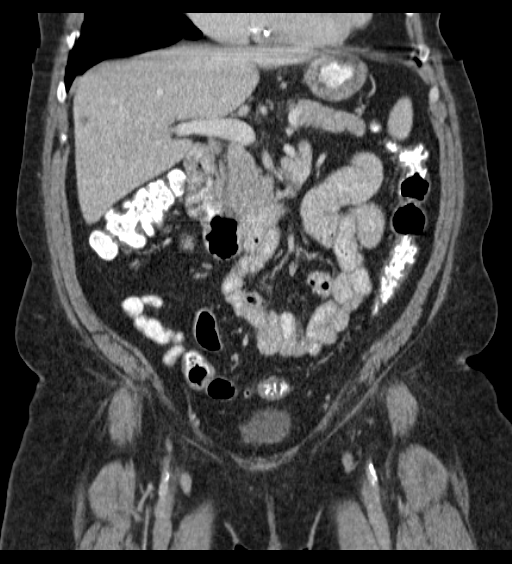
[im 102/184  soft-tissue]
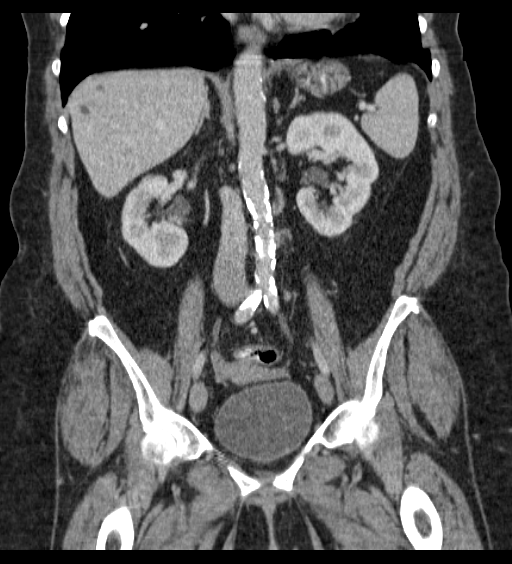

[16 of 46 positions shown; findings below may reference images not displayed]

FINDINGS: Lower chest:  The lung bases are clear.  No pleural effusion noted.

Hepatobiliary: There are scattered low-attenuation foci within the
liver. Findings compatible with multiple cysts. The gallbladder is
normal. No biliary dilatation.

Pancreas: Normal appearance of the pancreas.

Spleen: The spleen is unremarkable.

Adrenals/Urinary Tract: Normal appearance of both adrenal glands. 9
mm hypodensity within the upper pole of the right kidney likely
represents a cyst. Similarly there is 9 mm low attenuation structure
within the upper pole the left kidney. The urinary bladder appears
normal.

Stomach/Bowel: Normal appearance of the stomach and small bowel
loops. The terminal ileum and appendix are visualized and appear
normal. Normal appearance of the colon.

Vascular/Lymphatic: Calcified atherosclerotic disease involves the
abdominal aorta. No aneurysm. There is no retroperitoneal
adenopathy. There is no pelvic or inguinal adenopathy identified.

Reproductive: The uterus and the adnexal structures are
unremarkable.

Other: There is no free fluid or fluid collections within the
abdomen or pelvis. Periumbilical hernia is noted which contains fat
only.

Musculoskeletal: Review of the visualized bony structures is
significant for mild spondylosis within the lumbar spine. Most
advanced at the L4-5 level.
IMPRESSION: 1. No acute findings within the abdomen or pelvis. No explanation
for patient's abdominal pain
2. Liver and kidney cysts.
3. Atherosclerotic disease.
4. Lumbar spondylosis

## 2015-02-03 NOTE — H&P (Signed)
PATIENT NAME:  Theresa [name], Theresa [name] MR#:  [id number] DATE OF BIRTH:  08/06/1956  DATE OF ADMISSION:  05/14/2012  PRIMARY CARE PHYSICIAN: [name], PA-C   PULMONARY: Wallene Huh, MD    CHIEF COMPLAINT: Shortness of breath.   HISTORY OF PRESENT ILLNESS: [name] is a 60 year old Caucasian female with ongoing tobacco abuse, history of chronic obstructive pulmonary disease, on home oxygen, who comes to the Emergency Room accompanied by her son with a complaint of increasing shortness of breath. The patient uses oxygen mostly during the nighttime and also during the day if she  exerts a lot. She comes in today with increasing shortness of breath, had some chest tightness due to shortness of breath, came to the Emergency Room and received IV Solu-Medrol nebulizer treatment. Her saturations are 96% on 2 liters. The rest of the vitals are stable. Chest x-ray was negative for pneumonia. She is being admitted for overnight observation since the patient does not feel at her baseline.   PAST MEDICAL HISTORY:  1. Chronic  obstructive pulmonary disease.  2. Hypertension.  3. Ovarian cyst by history.  4. Bilateral salpingo-oophorectomy.   MEDICATIONS:  1. She is on a study drug. 2. Spiriva 18 mcg inhalation daily.  3. ProAir HFA 2 puffs p.r.n.  4. DuoNebs.  5. Bupropion 150 mg b.i.d.  6. Bisoprolol hydrochlorothiazide 10/6.25 p.o. daily.  7. Albuterol ipratropium.  8. Advair 250/50, 1 puff b.i.d.   9. Sertraline 100 mg p.o. daily.   SOCIAL HISTORY: She lives at home. She continues to smoke about a pack a day.  Denies any history of alcohol. She used to work in a [employer].   FAMILY HISTORY: Negative for lung problems with history of premature coronary artery disease.   ALLERGIES: No true allergies reported. Side effects recorded causing nausea and vomiting.   REVIEW OF SYSTEMS: CONSTITUTIONAL: No fever, fatigue, weakness. EYES: No blurred or double vision. No glaucoma. ENT: No  tinnitus, ear pain, hearing loss. RESPIRATORY: Positive for cough chronic, with shortness of breath and wheezing, and chronic obstructive pulmonary disease. CARDIOVASCULAR: Chest tightness due to chronic obstructive pulmonary disease and hypertension. GI: No nausea, vomiting, diarrhea, or abdominal pain. GU: No dysuria or hematuria. ENDOCRINE: No polyuria or nocturia. HEMATOLOGY: No anemia or easy bruising. SKIN: No acne or rash. MUSCULOSKELETAL: Positive for arthritis. NEUROLOGIC: No cerebrovascular accident or transient ischemic attack. PSYCHIATRIC: No anxiety or depression. All other systems reviewed and negative.   PHYSICAL EXAMINATION:  GENERAL: The patient is awake, alert, oriented x3, not in acute distress.   VITAL SIGNS: Afebrile, pulse 84, blood pressure 136/84, sats are 96% on room air.   HEENT: Atraumatic, normocephalic. Pupils are equal, round, and reactive to light and accommodation. Extraocular movements intact. Oral mucosa is moist.   NECK: Supple. No JVD. No carotid bruit.   RESPIRATORY: Distant breath sounds, coarse breath sounds. A few expiratory wheezes heard. No respiratory distress or use of accessory muscles.   CARDIOVASCULAR: Both the heart sounds are normal. Rate, rhythm is regular. PMI not lateralized. Chest nontender.   EXTREMITIES: Good pedal pulses, good femoral pulses. No lower extremity edema.   ABDOMEN: Soft, benign, and nontender. No organomegaly. Positive bowel sounds.   NEUROLOGIC: Crossly intact cranial nerves XII through XII. No motor or sensory deficits.  PSYCHIATRIC: The patient is awake, alert, oriented x3. White count is 7.7, hemoglobin and hematocrit is 16.6 and 48.5. Comprehensive metabolic panel within normal limits. Cardiac enzymes first set negative. CT of the chest shows no evidence  of pulmonary embolus, no  acute thoracic aortic pathology. No alveolar pneumonia. Positive for emphysema. No congestive heart failure. B-type natriuretic peptide is 117.    ASSESSMENT AND PLAN: Ms, Theresa [name] is a 59 year old with history of chronic obstructive pulmonary disease and chronic respiratory failure, on home oxygen-dependent on about 2 to 2.5 liters, ongoing smoking, being admitted for:   1. Acute on chronic respiratory failure secondary to chronic obstructive pulmonary disease exacerbation: We will admit the patient to a Medical floor.   FULL CODE.  Regular diet. Start the patient on IV Solu-Medrol, around-the-clock DuoNebs, Advair, Spiriva and continue oxygen support. No indication for antibiotics at present.  2. Ongoing tobacco abuse: The patient requests A nicotine patch. I will prescribe her and discuss smoking cessation, about 3 minutes spent. The patient does not appear to be much motivated.  3. Hypertension: Continue bisoprolol.  4. Chronic anxiety: Continues sertraline and Buspirone.    Further work-up according to the patient's clinical course. The hospital admission plan was discussed with the patient, who is agreeable to it.   CODE STATUS:  FULL CODE.     TIME SPENT: 50 minutes  ____________________________ Gus Height A. Posey Pronto, MD sap:cbb D: 05/14/2012 17:16:25 ET T: 05/14/2012 18:05:23 ET JOB#: [id number]  cc: Tamarick Kovalcik A. Posey Pronto, MD, <Dictator> Vickki Muff. Chrismon, PA Herbon E. Raul Del, MD Ilda Basset MD ELECTRONICALLY SIGNED 05/22/2012 7:19

## 2015-02-03 NOTE — Discharge Summary (Signed)
PATIENT NAME:  Theresa [name], Theresa [name] MR#:  [id number] DATE OF BIRTH:  12-27-1955  DATE OF ADMISSION:  05/14/2012 DATE OF DISCHARGE:  05/15/2012  ADMITTING PHYSICIAN:  Dr. Fritzi Mandes DISCHARGING PHYSICIAN: Dr. Gladstone Lighter  PRIMARY CARE PHYSICIAN:  Dr. Jeananne Rama  PRIMARY PULMONOLOGIST: Dr. Raul Del   CONSULTATIONS IN THE HOSPITAL: None.   DISCHARGE DIAGNOSES:  1. Chronic obstructive pulmonary disease exacerbation.  2. Chronic respiratory failure secondary to chronic obstructive pulmonary disease on home oxygen.  3. Hypertension.  4. Pleuritic chest pain.  5. Ongoing smoking.  6. Chronic anxiety.  7. Bronchitis.  DISCHARGE MEDICATIONS:  1. Motrin 400 mg p.o. every eight hours p.r.n. for pleuritic chest pain.  2. Prednisone taper 60 mg p.o. daily and taper off  by 10 mg every other day.  3. Levaquin 500 mg  p.o. daily.  4. Sertraline 100 mg p.o. daily. 5. Advair 250/50, 1 puff b.i.d.  6. Spiriva HandiHaler 1 inhalation daily.  7. Albuterol and Atrovent nebulizer treatments, 3 mL q. 6 hours p.r.n.  8. Bisoprolol/HCTZ 10 mg/6.25 mg, 1 tablet p.o. daily.  9. ProAir inhaler 3 puffs q. 6 hours p.r.n. for wheezing.  10. The patient is also on a study drug.   DISCHARGE HOME OXYGEN:  2 liters. The patient is already on home oxygen.   DISCHARGE DIET: Low-sodium diet.   DISCHARGE ACTIVITY: As tolerated.   FOLLOWUP INSTRUCTIONS:  1. Primary care physician followup in 1 to 2 weeks.  2. Smoking cessation advised.  3. Follow up with pulmonologist Dr. Raul Del in 1 to 2 weeks.   LABS AND IMAGING STUDIES: WBC 7.7, hemoglobin 16.6, hematocrit 48.4, platelet count 191.   Sodium 140, potassium 4.1, chloride 107, bicarbonate 27, BUN 16, creatinine 0.64, glucose 101, calcium 9.1.   ALT 21, AST 21, alkaline phosphatase 109, total bili 0.4, albumin 3.7. D-dimer was elevated at 0.52. BNP within normal limits at 117. Cardiac enzymes- one set negative. Chest x-ray showing chronic diffuse bilateral  interstitial thickening, likely chronic interstitial disease.  CT of the chest with contrast showing no evidence of any PE. No acute thoracic aortic pathology. Bibasilar atelectasis with no focal alveolar pneumonia is seen. Underlying emphysema is present. No evidence of congestive heart failure. Hypodensities in the liver previously demonstrated which could be cysts.  BRIEF HOSPITAL COURSE: [name] is a 59 year old  Caucasian female with ongoing smoking, chronic respiratory failure secondary to chronic obstructive pulmonary disease on home oxygen who presents to the Emergency Room with worsening shortness of breath and also chest tightness with productive cough. The patient had been on a steroid taper three weeks prior to this. 1. Chronic obstructive pulmonary disease exacerbation. Started on Solu-Medrol with much improvement in breathing. Her sats have been fine while in the hospital. She is on 2 liters oxygen at home and continues to be on the same. Levaquin was added for her bronchitis symptoms and the productive cough, though she has been afebrile while in the hospital. She will be discharged on a prednisone taper. She can continue her Spiriva, DuoNebs, and Advair and follow up with Dr. Raul Del. She was strongly advised on smoking cessation. 2. Hypertension. Continue home medication bisoprolol/ hydrochlorothiazide. 3. Chronic anxiety. The patient is on sertraline which she can continue to take. 4. Her course has been otherwise uneventful in the hospital.   DISCHARGE CONDITION: Stable.   DISCHARGE DISPOSITION: Home.   TIME SPENT ON DISCHARGE: 45 minutes.     ____________________________ Gladstone Lighter, MD rk:bjt D: 05/15/2012 14:57:57 ET T: 05/16/2012  11:54:55 ET JOB#: [id number]  cc: Gladstone Lighter, MD, <Dictator> Guadalupe Maple, MD Herbon E. Raul Del, MD Gladstone Lighter MD ELECTRONICALLY SIGNED 05/21/2012 15:11

## 2015-02-06 NOTE — Discharge Summary (Signed)
PATIENT NAME:  Theresa [name], Theresa [name] MR#:  [id number] DATE OF BIRTH:  Feb 14, 1956  DATE OF ADMISSION:  07/17/2013 DATE OF DISCHARGE:  07/18/2013  PRIMARY CARE PHYSICIAN: Dr. Jeananne Rama.  DISCHARGE DIAGNOSES:  1. Acute on chronic respiratory failure.  2. Chronic obstructive pulmonary disease exacerbation.  3. Hypertension.  4. Ongoing tobacco abuse.  5. Anxiety.   CONDITION: Stable.   CODE STATUS: FULL CODE.   HOME MEDICATIONS:  1. Spiriva 18 mcg  1 cap  inhaled once a day.  2. Advair Diskus 250 mcg/50 mcg inhalation powder 1 puff b.i.d. 3. Sertraline 100 mg p.o. daily.  4. Bisoprolol/hydrochlorothiazide 10 mg 6.25 mg p.o. daily.  5. DuoNeb 2.5 mg/0.5 mg per 3 mL inhalation solution 3 mL every four hours p.r.n.  6. ProAir HFA CFC free 90 mcg inhalation aerosol 2 puffs 4 times a day p.r.n.  7. Prednisone 40 mg p.o. daily for two days, and then taper.  8. Guaifenesin  600 mg p.o. b.i.d. p.r.n. for cough for seven days.  9. The patient will continue home oxygen by nasal cannula 2.5 liters.   DIET: Low sodium diet.   ACTIVITY: As tolerated.   FOLLOW-UP CARE: Follow with PCP within 1 to 2 weeks.   REASON FOR ADMISSION: Shortness of breath and  productive cough.   HOSPITAL COURSE:  1. The patient is a 59 year old Caucasian female with a history of COPD and chronic respiratory failure on home oxygen, presented in the ED with shortness of breath and cough. For detailed history and physical examination, please refer to the admission note dictated by Dr. Fritzi Mandes. The patient's laboratory data show CBC in normal range. Troponin less than 0.02. Chest x-ray consistent with COPD. CMP was in normal range. EKG sinus rhythm with a PAC. The patient was admitted for COPD exacerbation. After admission, the patient was treated with Solu-Medrol IVPB and Levaquin with Advair, Spiriva and ProAir and DuoNeb. The patient's symptoms have much improved, only has mild wheezing.  2. Tobacco abuse. The patient  was counseled for smoking cessation.  3. Hypertension has been stable.  4. The patient is clinically stable and will be discharged to home today. I discussed the patient's discharge plan with the patient and case manager and  nurse.   TIME SPENT: About 33 minutes.   ____________________________ Demetrios Loll, MD qc:sg D: 07/18/2013 14:13:31 ET T: 07/18/2013 14:54:10 ET JOB#: [id number]  cc: Demetrios Loll, MD, <Dictator> Demetrios Loll MD ELECTRONICALLY SIGNED 07/19/2013 15:18

## 2015-02-06 NOTE — H&P (Signed)
PATIENT NAME:  Theresa [name], Theresa [name] MR#:  [id number] DATE OF BIRTH:  07/13/1956  DATE OF ADMISSION:  07/15/2013  PRIMARY CARE PHYSICIAN: Dr. Jeananne [name].   CHIEF COMPLAINT: Shortness of breath and productive cough.   Ms Theresa [name] is a 59 year old Caucasian female with a past medical history of COPD  with ongoing tobacco abuse, who comes to the Emergency Room after she started having upper an respiratory infection for the past 10 to 12 days. She took over-the-counter medications and her symptoms started getting worse to the point she was having wheezing and green-colored  productive phlegm.   Denies any fever. Came to the Emergency Room. Her sats were 92% on 2 liters. The patient normally wears oxygen, 2.5 liters nasal cannula during nighttime for her COPD and hypoxia. She said she reports taking "a couple nebulizer treatments." Did not feel better. Came to the Emergency Room where she received p.o. prednisone and  several nebulizer treatments. She is being admitted for overnight observation since she does not feel at her baseline.   PAST MEDICAL HISTORY:  1.  COPD.  2.  Hypertension.  3.  Ovarian cyst, by history.  4.  Bilateral salpingo-oophorectomy.   ALLERGIES: CODEINE.   MEDICATIONS: 1.   Advair 250/50, 1 puff b.i.d.  2.  Albuterol.  3.  DuoNebs 3 mL as needed q.4 hourly.  4.  Spiriva 18 mcg inhalation daily.  5.  Bisoprolol/hydrochlorothiazide 10/6.25 daily.  6.  ProAir HFA 2 puffs 4 times a day as needed.  7.  Sertraline 100 mg p.o. daily.   SOCIAL HISTORY: She is married. Continues to smoke less than a pack a day. Denies any history of alcohol. She used to work at a [employer].   FAMILY HISTORY: Negative for lung problems. Positive for premature coronary artery disease.   REVIEW OF SYSTEMS:  CONSTITUTIONAL: No fever. Positive for fatigue, weakness.  EYES: No blurred or double vision, glaucoma or cataracts.  ENT: No tinnitus, ear pain, redness of oropharynx, difficulty  swallowing.  CARDIORESPIRATORY: Positive for cough, wheeze, COPD and dyspnea.  CARDIOVASCULAR: No chest pain, orthopnea, edema or arrhythmia.  GASTROINTESTINAL: No nausea, vomiting, diarrhea, abdominal pain or GERD.  GENITOURINARY: No dysuria, hematuria or frequency.  ENDOCRINE: No polyuria, nocturia or thyroid problems.  HEMATOLOGY: No anemia or easy bruising.  SKIN: No acne, or rash, or lesions.  MUSCULOSKELETAL: Positive for arthritis. No gout or swelling of joints.  NEUROLOGIC: No CVA, TIA, ataxia or vertigo.  PSYCHIATRIC: No anxiety or depression.   All other systems reviewed and negative.   PHYSICAL EXAMINATION: GENERAL: The patient is awake, alert, oriented x 3, not in acute distress.  VITAL SIGNS: Temperature is 99.1, pulse is 80, blood pressure is 103/61. Sats are 94% on  2 liters.  HEENT: Atraumatic, normocephalic. Pupils: Pupils PERRLA. EOMs are intact. Oral mucosa is moist.  NECK: Supple. No JVD. No carotid bruit.  RESPIRATORY: Coarse breath sounds bilaterally. No rales. Positive for some scattered rhonchi.  CARDIOVASCULAR: Both the heart sounds are normal. Rate, rhythm is regular. PMI not lateralized. Chest is nontender.  EXTREMITIES: Good pedal pulses. Good femoral pulses. No lower extremity edema.  ABDOMEN: Soft, benign, nontender. No organomegaly. Positive bowel sounds.  NEUROLOGIC: Grossly intact cranial nerves II-XII. No motor or sensory deficit.  PSYCHIATRIC: The patient is awake, alert, oriented x 3.  SKIN: Warm and dry.    Chest x-ray: Findings consistent with COPD. Comprehensive metabolic panel within normal limits. CBC within normal limits. Troponin is less than 0.02.   EKG  shows sinus rhythm with PAC.   ASSESSMENT AND PLAN: 59 year-old [employer] with a history of chronic obstructive pulmonary disease, ongoing tobacco abuse on home oxygen about 2 to 2.5 liters, being admitted for:   1. Acute-on-chronic respiratory failure secondary to chronic  obstructive pulmonary disease flare: We will admit the patient to the medical floor.  2.  THE PATIENT IS A FULL CODE.  3.  Will continue prednisone taper. The patient received 60 mg of p.o. prednisone. Will give her a course of antibiotic with Z-Pak. Continue Advair, Spiriva, and ProAir along with DuoNebs. 4.  Ongoing tobacco abuse: Discussed cessation smoking cessation about 3 to 4 minutes. About 4  minutes spent. The patient does not appear to be motivated. Although she states she is going to try.  5.  Hypertension: Continue metoprolol/hydrochlorothiazide.  6.  Chronic anxiety: Continue Zoloft.   7.  Deep venous thrombosis prophylaxis. The patient is ambulatory. Further workup depending on the patient's clinical course.   Hospital admission plan was discussed with the patient and the patient's family members.   Time spent: Forty minutes.    ____________________________ Hart Rochester Posey Pronto, MD sap:dm D: 07/15/2013 15:12:52 ET T: 07/15/2013 15:44:09 ET JOB#: [id number]  cc: Theresa [name] A. Posey Pronto, MD, <Dictator> [place] MD ELECTRONICALLY SIGNED 07/16/2013 14:23

## 2015-02-08 NOTE — H&P (Signed)
PATIENT NAME:  Theresa [name], Theresa [name] MR#:  [id number] DATE OF BIRTH:  [birth year]  DATE OF ADMISSION:  10/16/2011  PRIMARY CARE PHYSICIAN: Simona Huh Chrismon, PA.   CHIEF COMPLAINT: Increased shortness of breath and wheezing.   HISTORY OF PRESENT ILLNESS: Theresa [name] is a 59 year old Caucasian female with history of chronic obstructive pulmonary disease. She states that last October she was admitted to [facility] with pneumonia and chronic obstructive pulmonary disease exacerbation and discharged home on home oxygen using 2.5 liters at night and also upon exertion. She was in her usual state of health until yesterday when she started to have increased shortness of breath associated with wheezing, sputum production, yellowish-greenish sputum along with her cough. She had no fever, but reports hot and cold feelings. Today her shortness of breath and wheezing exacerbated and started to develop pain in her chest referring to the epigastrium and the sides of both lower chest areas. With her dyspnea, she decided to come to the Emergency Department. Evaluation here was consistent with chronic obstructive pulmonary disease exacerbation. The patient received a breathing treatment here and IV antibiotic using Levaquin. Nevertheless, she has only partial improvement and she remains short of breath. The patient was admitted for further evaluation and management. It is unfortunate that this patient continues to smoke despite her underlying lung problem.   REVIEW OF SYSTEMS: CONSTITUTIONAL: Denies having fever, but reports some chills and hot and cold feeling. No night sweats. EYES: Denies any blurring of vision. No double vision. ENT: No hearing impairment. No sore throat. No dysphagia. CARDIOVASCULAR: She refers to chest pain in the epigastric area and the lower chest on both sides. This is started today. She has shortness of breath. No edema. No syncope. RESPIRATORY: Admits having shortness of breath and wheezing, cough and sputum  production. GASTROINTESTINAL: No abdominal pain. No nausea, no vomiting. GENITOURINARY: No dysuria. No frequency of urination. MUSCULOSKELETAL: No joint pain or swelling. No muscular pain or swelling. INTEGUMENTARY: No skin rash or ulcers. NEUROLOGIC: No focal weakness. No seizure activity. No headache today, but she has history of recurrent headaches for which she takes soma. PSYCHIATRY: Denies anxiety or depression. ENDOCRINE: Denies night sweats. No heat or cold intolerance.   PAST MEDICAL HISTORY:  1. History of chronic obstructive pulmonary disease.  2. History of systemic hypertension. 3. History of ovarian cyst. 4. History of bilateral salpingo-oophorectomy.   ADMISSION MEDICATIONS:  1. ProAir HFA 2 puffs 4 times a day p.r.n.  2. Spiriva 1 inhalation once a day. 3. Advair Diskus 250/50 twice a day. 4. Sertraline 100 mg once a day. 5. Soma 250 mg q.6 hours p.r.n. for headaches.  6. Bisoprolol 10/6.25 mg once a day.  7. DuoNebs every.6 hours p.r.n.  8. She takes over-the-counter antacid.   ALLERGIES: No true allergies. She reports side effects to codeine causing nausea and vomiting.  SOCIAL HABITS: Chronic smoker. She used to smoke 1-1/2 packs a day since age 75. She cut down to 1 pack or less than that a day. No history of alcoholism. No history of other drug abuse.   SOCIAL HISTORY: She is married, living with her husband. She is now on a leave from her walk. She used to work in [employer], but in a Network engineer job.   FAMILY HISTORY: Negative for lung problems. There is family history of premature coronary artery disease. Her brother had heart attacks in his late 3s. Her daughter is in our hospital here today. She is being treated for pneumonia.   PHYSICAL  EXAMINATION:  VITAL SIGNS: Blood pressure 114/78, respiratory rate 22, pulse 100, temperature 98.4, oxygen saturation 93%.   GENERAL APPEARANCE: Middle-aged female sitting at the bedside chair, appears short of breath.    HEAD/NECK: No pallor. No icterus. No cyanosis.   ENT: Hearing was normal. Nasal mucosa, lips, tongue were normal.   EYES: Normal iris and conjunctivae. Pupils about 4 mm, equal and reactive to light.   NECK: Supple. Trachea at midline. No thyromegaly. No cervical lymphadenopathy.   HEART: Normal S1, S2. No S3, S4. No murmur. No gallop. No carotid bruits.   RESPIRATORY: Evidence of tachypnea. The patient is using accessory muscles for breathing, prolonged expiratory phase and bilateral wheezing. No rales. Her wheezing is both inspiratory and expiratory, but mainly expiratory wheezing.   ABDOMEN: Morbidly obese, soft without tenderness. No hepatosplenomegaly. No masses. No hernias.   MUSCULOSKELETAL: No joint swelling. No clubbing.   SKIN: No ulcers. No subcutaneous nodules.   NEUROLOGIC: Cranial nerves II through XII are intact. No focal motor deficit. Coordination movements were normal.   PSYCHIATRY: The patient is alert and oriented x3. Mood and affect were normal. Chest x-ray showed evidence of hyperinflation consistent with chronic obstructive pulmonary disease. No consolidation, no effusion.   LABORATORY, RADIOLOGICAL AND DIAGNOSTIC DATA: Serum glucose 117, BUN 10, creatinine 0.7, sodium 142, potassium 3.7. Liver function tests were normal. CBC showed white count of 14,000, hemoglobin 14, hematocrit 43, platelet count 170.   IMPRESSION:  1. Chronic obstructive pulmonary disease exacerbation.  2. Chronic respiratory failure, home oxygen dependent. 3. Chest pain, most likely musculoskeletal from exertion and using accessory muscles due to her chronic obstructive pulmonary disease exacerbation. However, would like to check her EKG and also check her troponin. Index of suspicion for cardiac event is low.  4. History of systemic hypertension.  5. History of ovarian cyst. 6. History of bilateral salpingo-oophorectomy.   PLAN:  1. The patient will be admitted to the medical floor.   2. Will be treated with DuoNeb every four hours p.r.n. while awake. IV Solu-Medrol. IV Levaquin 750 mg a day. Continue Spiriva and Advair inhalation treatments.  3. For her hypertension, I will discontinue bisoprolol since there is relative contraindication with beta blocker in her case. I will replace that with ACE inhibitor using lisinopril 10 mg a day and hydrochlorothiazide 12.5 mg a day. I will continue the rest of her medications.  4. Oxygen supplementation.  5. Nicotine patch. I counseled the patient regarding smoking issue and the hazards of her health issues, in particular her lung and heart. Right now, will place her on nicotine patch and take it from there.  6. For deep vein thrombosis prophylaxis, will place her on Lovenox 40 mg subcutaneous a day.  7. For peptic ulcer disease prophylaxis, she will be placed on Protonix 40 mg a day.   TIME NEEDED TO EVALUATE THIS PATIENT: More than 50 minutes.  ____________________________ Clovis Pu. Lenore Manner, MD amd:ap D: 10/16/2011 22:56:24 ET T: 10/17/2011 08:39:33 ET JOB#: [id number]  cc: Clovis Pu. Lenore Manner, MD, <Dictator> Vickki Muff. Chrismon, PA Floye Fesler Irven Coe MD ELECTRONICALLY SIGNED 10/18/2011 4:10

## 2015-02-08 NOTE — Discharge Summary (Signed)
PATIENT NAME:  Theresa [name], PENNY MR#:  [id number] DATE OF BIRTH:  July 18, 1956  DATE OF ADMISSION:  10/16/2011 DATE OF DISCHARGE:  10/19/2011  PRESENTING COMPLAINT: Shortness of breath and cough.   DISCHARGE DIAGNOSES:  1. Acute hypoxic respiratory failure secondary to acute on chronic obstructive pulmonary disease flare.  2. Acute bronchitis. 3. Hypertension. 4. Obesity. 5. Chronic tobacco abuse.   CONDITION ON DISCHARGE: Fair. Vitals stable. Blood pressure 108/78 and saturations are 92 to 95% on 2.5 liters.   DISCHARGE MEDICATIONS: 1. Bisoprolol/hydrochlorothiazide 10/6.25 mg p.o. daily.  2. ProAir HFA 2 puffs inhaled p.r.n.  3. Albuterol ipratropium 3 mL nebulizers four times daily as needed.  4. Carisoprodol 250 mg p.o. daily as needed for headaches.  5. Sertraline 100 mg daily.  6. Spiriva 18 mcg inhalation daily.  7. Advair 250/50 one puff twice a day. 8. Levaquin 750 mg p.o. daily for three days.  9. Prednisone taper.  10. Xanax 0.5 mg twice a day p.r.n.   OXYGEN: The patient will use oxygen continuous for now with activity.  DISCHARGE INSTRUCTIONS/FOLLOWUP: The patient will followup with Dr. Devona Konig as an outpatient. She will follow up with Mr. [name] in 1 to 2 weeks.   LABS/STUDIES: Cardiac enzymes x3 are negative.   Chest x-ray: No pneumonia. Positive for chronic obstructive pulmonary disease.   CBC is within normal limits, except white count of 14.2. Comprehensive metabolic panel is within normal limits.   BRIEF SUMMARY OF HOSPITAL COURSE: [name] is a 59 year old Caucasian female with history of chronic obstructive pulmonary disease who comes in with:  1. Acute on chronic respiratory failure secondary to chronic obstructive pulmonary disease exacerbation. The patient was started on IV Levaquin and IV steroids and has been changed to p.o. prednisone taper and p.o. Levaquin. She was continued on inhalers, DuoNebs, and Spiriva. PRN cough medicine was  given. The patient appears to be nearing her baseline. She will continue oxygen with activity currently.  2. Chest pain, appeared musculoskeletal and secondary to persistent dry cough. PRN Norco was given. Her cardiac enzymes are negative.  3. Ongoing tobacco abuse. She was strongly advised to stop smoking.  4. Hypertension. The patient was continued on her home medication.   Her hospital stay otherwise remained stable. The patient will followup with pulmonary and her primary care physician as an outpatient. She remained a FULL CODE.   TIME SPENT: 40 minutes. ____________________________ Hart Rochester Posey Pronto, MD sap:slb D: 10/19/2011 13:48:00 ET (Entered as incorrect work type - 08) T: 10/19/2011 15:18:11 ET JOB#: [id number]  cc: Vickki Muff. Chrismon, PA Safiyya Stokes A. Posey Pronto, MD, <Dictator> Ilda Basset MD ELECTRONICALLY SIGNED 10/20/2011 14:43

## 2015-02-15 NOTE — Discharge Summary (Signed)
PATIENT NAME:  Theresa [name], SCHEFF MR#:  [id number] DATE OF BIRTH:  1956-09-14  DATE OF ADMISSION:  02/01/2015 DATE OF DISCHARGE:  02/02/2015  ADDENDUM   Her Myoview stress test was a low risk probability test.  Estimated EF is 41%.      ____________________________ Donell Beers. Benjie Karvonen, MD spm:kc D: 02/03/2015 13:09:00 ET T: 02/03/2015 13:58:39 ET JOB#: [id number]  cc: Janalee Grobe P. Benjie Karvonen, MD, <Dictator> Donell Beers Ninah Moccio MD ELECTRONICALLY SIGNED 02/04/2015 13:24

## 2015-02-15 NOTE — Consult Note (Signed)
PATIENT NAME:  Theresa [name], BREES MR#:  [id number] DATE OF BIRTH:  01/26/1956  DATE OF CONSULTATION:  02/02/2015  REFERRING PHYSICIAN:   CONSULTING PHYSICIAN:  Corey Skains, MD  CONSULTING PHYSICIAN: Dr. Posey Pronto.   REASON FOR CONSULTATION: Known coronary artery disease, mixed hyperlipidemia, essential hypertension, chronic obstructive pulmonary disease with unstable angina.   CHIEF COMPLAINT: "I have chest pain."   HISTORY OF PRESENT ILLNESS: This is a 59 year old female with known apparent minimal coronary artery disease by cardiac catheterization in recent years, who has had significant COPD from smoking in the past, and has had recent waxing and waning of substernal chest pain radiating into her right upper chest, as well as into her back. This is waxing and waning and concerning for cardiovascular disease. The patient has had some shortness of breath with this, as well as some cough, but does not have any sputum production and/or other concerns of significant infection or fever. The patient does have occasionally some lower extremity edema also waxing and waning. The patient has had essential hypertension on medication management, as well as mixed hyperlipidemia not currently on medication management. The patient has had COPD for which she has stopped smoking.   REVIEW OF SYSTEMS: Negative for vision change, ringing in the ears, hearing loss, heartburn, nausea, vomiting, diarrhea, bloody stools, stomach pain, extremity pain, leg weakness, cramping in buttocks, known blood clots, headaches, blackouts, dizzy spells, nosebleeds, trouble swallowing, frequent urination, urination at night, muscle weakness, numbness, anxiety, depression, skin lesions or skin rashes.   PAST MEDICAL HISTORY:  1. Coronary artery disease.  2. Hypertension.  3. COPD.   FAMILY HISTORY: Positive for father having cardiovascular disease at an early age, and hypertension.   SOCIAL HISTORY: Currently denies  alcohol or tobacco use.   ALLERGIES AND MEDICATIONS: As listed.   PHYSICAL EXAMINATION:  VITAL SIGNS: Blood pressure 122/68 bilaterally, heart rate is 72 upright, reclining, and regular.  GENERAL: She is a well appearing female in no acute distress.  HEENT: No icterus, thyromegaly, ulcers, hemorrhage, or xanthelasma.  CARDIOVASCULAR: Regular rate and rhythm with normal S1 and S2 without murmur, gallop, or rub. PMI is inferiorly displaced. Carotid upstroke normal without bruit. Jugular venous pressure is normal.  LUNGS: Have a few expiratory wheezes.  ABDOMEN: Soft, nontender, without hepatosplenomegaly or masses. Abdominal aorta is normal size with without bruit.  EXTREMITIES: Showed 2+ radial, femoral, dorsal and pedal pulses with no lower extremity edema, cyanosis, clubbing or ulcers.  NEUROLOGIC: She is oriented to time, place, and person with normal mood and affect.   ASSESSMENT: A 59 year old female with essential hypertension, mixed hyperlipidemia, coronary artery disease with atypical chest pain with normal troponin and no evidence of EKG changes or myocardial infarction, at this time, requiring further medication management.   RECOMMENDATIONS:  1. Continue serial ECG and enzymes to assess for possible myocardial infarction.  2. Lexiscan infusion Myoview for assessment of physiologic coronary artery disease and ischemia.  3. Continue essential hypertension treatment with a goal systolic blood pressure below 130 mmHg.  4. Mixed hyperlipidemia treatment with a goal LDL below 70 mg/dL.  5. Begin ambulation looking other causes of shortness of breath.     ____________________________ Corey Skains, MD bjk:JT D: 02/02/2015 08:15:28 ET T: 02/02/2015 10:42:17 ET JOB#: [id number]  cc: Corey Skains, MD, <Dictator> Corey Skains MD ELECTRONICALLY SIGNED 02/04/2015 8:57

## 2015-02-15 NOTE — Discharge Summary (Signed)
PATIENT NAME:  Theresa [name], GEETING MR#:  [id number] DATE OF BIRTH:  10-14-1956  DATE OF ADMISSION:  02/01/2015 DATE OF DISCHARGE:  02/02/2015  ADMISSION DIAGNOSIS:  Chest pain.   DISCHARGE DIAGNOSES:   1. Chest pain.  2. Chronic obstructive pulmonary disease.  3. Tobacco dependence.    CONSULTATIONS:  Dr. Nehemiah Massed.   Myoview stress test is still pending.   PERTINENT LABORATORIES:  Troponins x3 are negative.   HOSPITAL COURSE:  This is a 59 year old female who presented with chest pain.  For further details, please refer to the H and P.  1. Chest pain. The patient underwent a stress test which is still pending.  If this negative, plan is for discharge.  Her troponins were negative.  Telemetry was negative.  She has had a cardiac catheterization in the past which showed nonobstructive CAD with minimal coronary artery disease.  2. Tobacco dependence.  The patient was highly encouraged to stop smoking.  She was counseled for 4 minutes.  She says she was smoking 2 packs a day and went down to 1 pack a day.  She did not want a nicotine patch at discharge.  3. COPD.  The patient will continue outpatient inhalers.   DISCHARGE PHYSICAL EXAMINATION:  VITAL SIGNS:  Temperature 97.7, pulse 74, respirations 18, blood pressure 130/82, and 94% on 2 liters.  GENERAL:  The patient was alert, oriented, not in acute distress.  CARDIOVASCULAR:  Regular rate and rhythm.  No murmur, gallops, or rubs.  LUNGS:  Clear to auscultation with minimal rhonchi bilaterally, no wheezing, no dullness to percussion.  ABDOMEN:  Bowel sounds are positive, nontender, nondistended.   EXTREMITIES:  No clubbing, cyanosis, or edema.   DISCHARGE MEDICATIONS:  1. Bisoprolol/HCTZ 10/6.25 daily.  2. Daliresp 500 mcg daily.  3. Zoloft 150 mg daily.  4. Breo Ellipta 200/25 one puff daily.  5. Spiriva 18 mcg daily.  6. Albuterol 2 inhalations q. 4 hours p.r.n.  7. Aspirin 81 mg daily.   DISCHARGE OXYGEN:  2 liters  nasal cannula.   DISCHARGE DIET:  Regular.   DISCHARGE ACTIVITY:  As tolerated.   DISCHARGE FOLLOWUP:  The patient will follow up with Simona Huh Chrismon in 1 week and also Dr. Clayborn Bigness in 1 week.    TIME SPENT:  Approximately 35 minutes.   The patient will be discharged if her stress test is negative.     ____________________________ Sacoya Mcgourty P. Benjie Karvonen, MD spm:kc D: 02/02/2015 13:24:10 ET T: 02/02/2015 14:26:39 ET JOB#: [id number]  cc: Hayes Czaja P. Benjie Karvonen, MD, <Dictator> Vickki Muff. Chrismon, [place] Clayborn Bigness, MD  Donell Beers Andi Mahaffy MD ELECTRONICALLY SIGNED 02/03/2015 13:30

## 2015-02-15 NOTE — H&P (Signed)
PATIENT NAME:  Theresa [name], Theresa [name] MR#:  [id number] DATE OF BIRTH:  [birth year]  DATE OF ADMISSION:  02/01/2015  CHIEF COMPLAINT:  Chest pain.   HISTORY OF PRESENT ILLNESS:  This is a 59 year old female who presents with intermittent chest pain off and on for the last 3 weeks, which oftentimes radiates to her right neck. She has also felt very fatigued and had been sleeping a lot during this time, she says, that she "did not feel right." Her chest pain is substernal and is a "tug" sensation and also a "tight" sensation. Today at 3:00 p.m., she came in because the chest pain was not going away, as it had been over the past 3 weeks. This chest pain is not related to activity necessarily and says oftentimes it occurs when she is at rest. She took heartburn medication several times throughout this time period, but she states that it never really helped. She did have some nausea today as well as some diaphoresis with the chest pain episodes. She also has a headache associated with the chest pain for today. Her cardiologist is Dr. Clayborn Bigness. She does have a known history of CAD with some incomplete blockages seen on studies that she has had in the past. Dr. Clayborn Bigness was contacted from the ED and recommended that she be admitted for ACS rule out, so the hospitalists were called for admission.   PRIMARY CARE PHYSICIAN:  Vickki Muff. Chrismon, [place]  PRIMARY CARDIOLOGIST:  Dwayne D. Callwood, MD   PAST MEDICAL HISTORY:  Degenerative disk disease, congestive heart failure, ovarian cyst, COPD, hypertension, arthritis of the spine, coronary artery disease.   MEDICATIONS:  Sertraline 100 mg 1-1/2 tablets daily, Daliresp 500 mcg daily, bisoprolol/hydrochlorothiazide combination 10/6.25 mg daily, Breo 1 puff daily, Estrol - the patient is unsure of the dose but believes she takes 10 mg, Spiriva 18 mcg capsule daily, albuterol inhaler as needed, DuoNebs as needed, and home oxygen at 2.5 L/min.   PAST SURGICAL HISTORY:   Tonsillectomy, hysterectomy, oophorectomy, tubal ligation, left thumb trigger finger repair, and left carpal tunnel surgery.   ALLERGIES:  No known drug allergies.   FAMILY HISTORY:  Hypertension, CAD in her mother and father, stroke in her mother, and lung and kidney cancer in her brother and sister.   SOCIAL HISTORY:  She smokes 1 pack of cigarettes per day for the last 30 years. Denies alcohol or illicit drug use.   REVIEW OF SYSTEMS: CONSTITUTIONAL:  Denies fever. Endorses fatigue. Denies weakness.  EYES:  Denies blurred or double vision, pain, or redness.  EAR, NOSE, AND THROAT:  Denies ear pain, hearing loss, or difficulty swallowing.  RESPIRATORY:  Denies cough, dyspnea, or painful respiration.  CARDIOVASCULAR:  Endorses chest pain. Denies edema or palpitations.  GASTROINTESTINAL:  Endorses some nausea today. Denies vomiting or diarrhea. Denies abdominal pain or constipation.  GENITOURINARY:  Denies dysuria, hematuria, or frequency.  ENDOCRINE:  Denies nocturia, thyroid problems, or heat or cold intolerance.  HEMATOLOGIC AND LYMPHATIC:  Denies easy bruising or bleeding or swollen glands.  INTEGUMENTARY:  Denies acne, rash, or lesion.  MUSCULOSKELETAL:  Denies acute arthritis, joint swelling, or gout.  NEUROLOGICAL:  Denies numbness or weakness. Endorses some headache.  PSYCHIATRIC:  Denies anxiety, insomnia, or depression.   PHYSICAL EXAMINATION: VITAL SIGNS:  Blood pressure 144/88, pulse 74, temperature 97.7, respirations 18 with 96% oxygen saturation on 2.5 liters of O2 via nasal cannula.  GENERAL:  This is a well-nourished female sitting up in bed in no acute  distress.  HEENT:  Pupils are equal, round, and reactive to light and accommodation. Extraocular movements are intact. No scleral icterus. Moist mucosal membranes.  NECK:  Thyroid is not enlarged. Neck is supple. No masses. Nontender. No cervical adenopathy. No JVD.  RESPIRATORY:  Clear to auscultation bilaterally with no  rales, rhonchi, or wheezing. No respiratory distress.  CARDIOVASCULAR:  Regular rate and rhythm. No murmurs, rubs, or gallops on exam. Good pedal pulses with trace bilateral lower extremity edema.  ABDOMEN:  Soft, nontender, nondistended. Good bowel sounds.  MUSCULOSKELETAL:  Muscular strength 5/5 in all 4 extremities. Full spontaneous range of motion throughout. No cyanosis or clubbing.  SKIN:  No rash or lesions. Skin is warm, dry, and intact.  LYMPHATIC:  No adenopathy.  NEUROLOGIC:  Cranial nerves are intact. Sensation is intact throughout. No dysarthria or aphasia.  PSYCHIATRIC:  Alert and oriented x 3, cooperative, not confused or agitated.   LABORATORY DATA:  White count is 10.3, hemoglobin 16.3, hematocrit 48.6, and platelets are 154,000. Sodium is 140, potassium 3.8, chloride 102, bicarbonate 32, BUN 17, creatinine 0.65, and glucose is 133. BNP is 107. Calcium is 9.4. Troponin is less than 0.03.   RADIOLOGIC DATA:  Mild interstitial prominence in part reflects COPD, superimposed interstitial edema or atypical viral infection not excluded in the appropriate clinical setting. Hazy lung base opacities may be accentuated by overlying soft tissue.   ASSESSMENT AND PLAN: 1.  Unstable angina. So far, troponin is negative and EKG without significant new ischemic changes. We will obtain a cardiology consult, as she was brought in per their recommendation. They will see her in the morning, and we will follow further recommendations by them at that time.  2.  Chronic obstructive pulmonary disease. She is on oxygen at baseline. We will continue her home medications and inhalers here.  3.  Hypertension. We will continue her home medications for this chronic stable problem.  4.  Deep vein thrombosis prophylaxis with subcutaneous Lovenox.   Code Status: Full Code  TIME SPENT ON THIS ADMISSION:  45 minutes.    ____________________________ Wilford Corner. Jannifer Franklin, MD dfw:nb D: 02/01/2015 23:40:53  ET T: 02/02/2015 00:43:17 ET JOB#: [id number]  cc: Wilford Corner. Jannifer Franklin, MD, <Dictator> Casanova Schurman Fawn Kirk MD ELECTRONICALLY SIGNED 02/02/2015 4:44

## 2015-03-01 ENCOUNTER — Ambulatory Visit
Admission: EM | Admit: 2015-03-01 | Discharge: 2015-03-01 | Disposition: A | Discharge status: Home / Self Care | Payer: Medicare PPO | Attending: Family Medicine | Admitting: Family Medicine

## 2015-03-01 ENCOUNTER — Ambulatory Visit: Payer: Medicare PPO

## 2015-03-01 ENCOUNTER — Encounter: Payer: Self-pay | Admitting: Family Medicine

## 2015-03-01 DIAGNOSIS — R062 Wheezing: Secondary | ICD-10-CM | POA: Diagnosis present

## 2015-03-01 DIAGNOSIS — F17200 Nicotine dependence, unspecified, uncomplicated: Secondary | ICD-10-CM | POA: Insufficient documentation

## 2015-03-01 DIAGNOSIS — J441 Chronic obstructive pulmonary disease with (acute) exacerbation: Secondary | ICD-10-CM | POA: Diagnosis not present

## 2015-03-01 DIAGNOSIS — I10 Essential (primary) hypertension: Secondary | ICD-10-CM | POA: Diagnosis not present

## 2015-03-01 DIAGNOSIS — J209 Acute bronchitis, unspecified: Secondary | ICD-10-CM | POA: Diagnosis not present

## 2015-03-01 DIAGNOSIS — R05 Cough: Secondary | ICD-10-CM | POA: Diagnosis present

## 2015-03-01 DIAGNOSIS — R0602 Shortness of breath: Secondary | ICD-10-CM | POA: Diagnosis present

## 2015-03-01 DIAGNOSIS — J440 Chronic obstructive pulmonary disease with acute lower respiratory infection: Secondary | ICD-10-CM | POA: Insufficient documentation

## 2015-03-01 MED ORDER — HYDROCOD POLST-CPM POLST ER 10-8 MG/5ML PO SUER: 5.0000 mL | Freq: Two times a day (BID) | ORAL | Status: DC | PRN

## 2015-03-01 MED ORDER — LEVOFLOXACIN 500 MG PO TABS: 500.0000 mg | ORAL_TABLET | Freq: Every day | ORAL | Status: DC

## 2015-03-01 MED ORDER — METHYLPREDNISOLONE SODIUM SUCC 125 MG IJ SOLR
125.0000 mg | Freq: Once | INTRAMUSCULAR | Status: AC
  Administered 2015-03-01: 125 mg via INTRAMUSCULAR

## 2015-03-01 MED ORDER — PREDNISONE 10 MG (21) PO TBPK: ORAL_TABLET | ORAL | Status: DC

## 2015-03-01 NOTE — ED Notes (Signed)
Pt c/o wheezing/cough with yellow sputum/ shortness of breath since Wednesday, denies any fever, uses home o2 3 l/min , h/o COPD

## 2015-03-01 NOTE — Discharge Instructions (Signed)
Acute Bronchitis Bronchitis is when the airways that extend from the windpipe into the lungs get red, puffy, and painful (inflamed). Bronchitis often causes thick spit (mucus) to develop. This leads to a cough. A cough is the most common symptom of bronchitis. In acute bronchitis, the condition usually begins suddenly and goes away over time (usually in 2 weeks). Smoking, allergies, and asthma can make bronchitis worse. Repeated episodes of bronchitis may cause more lung problems. HOME CARE  Rest.  Drink enough fluids to keep your pee (urine) clear or pale yellow (unless you need to limit fluids as told by your doctor).  Only take over-the-counter or prescription medicines as told by your doctor.  Avoid smoking and secondhand smoke. These can make bronchitis worse. If you are a smoker, think about using nicotine gum or skin patches. Quitting smoking will help your lungs heal faster.  Reduce the chance of getting bronchitis again by:  Washing your hands often.  Avoiding people with cold symptoms.  Trying not to touch your hands to your mouth, nose, or eyes.  Follow up with your doctor as told. GET HELP IF: Your symptoms do not improve after 1 week of treatment. Symptoms include:  Cough.  Fever.  Coughing up thick spit.  Body aches.  Chest congestion.  Chills.  Shortness of breath.  Sore throat. GET HELP RIGHT AWAY IF:   You have an increased fever.  You have chills.  You have severe shortness of breath.  You have bloody thick spit (sputum).  You throw up (vomit) often.  You lose too much body fluid (dehydration).  You have a severe headache.  You faint. MAKE SURE YOU:   Understand these instructions.  Will watch your condition.  Will get help right away if you are not doing well or get worse. Document Released: 03/21/2008 Document Revised: 06/05/2013 Document Reviewed: 03/26/2013 [facility] Patient Information 2015 [place], [place]. This information is  not intended to replace advice given to you by your health care provider. Make sure you discuss any questions you have with your health care provider. Chronic Obstructive Pulmonary Disease Chronic obstructive pulmonary disease (COPD) is a common lung problem. In COPD, the flow of air from the lungs is limited. The way your lungs work will probably never return to normal, but there are things you can do to improve your lungs and make yourself feel better. HOME CARE  Take all medicines as told by your doctor.  Avoid medicines or cough syrups that dry up your airway (such as antihistamines) and do not allow you to get rid of thick spit. You do not need to avoid them if told differently by your doctor.  If you smoke, stop. Smoking makes the problem worse.  Avoid being around things that make your breathing worse (like smoke, chemicals, and fumes).  Use oxygen therapy and therapy to help improve your lungs (pulmonary rehabilitation) if told by your doctor. If you need home oxygen therapy, ask your doctor if you should buy a tool to measure your oxygen level (oximeter).  Avoid people who have a sickness you can catch (contagious).  Avoid going outside when it is very hot, cold, or humid.  Eat healthy foods. Eat smaller meals more often. Rest before meals.  Stay active, but remember to also rest.  Make sure to get all the shots (vaccines) your doctor recommends. Ask your doctor if you need a pneumonia shot.  Learn and use tips on how to relax.  Learn and use tips on how  to control your breathing as told by your doctor. Try:  Breathing in (inhaling) through your nose for 1 second. Then, pucker your lips and breath out (exhale) through your lips for 2 seconds.  Putting one hand on your belly (abdomen). Breathe in slowly through your nose for 1 second. Your hand on your belly should move out. Pucker your lips and breathe out slowly through your lips. Your hand on your belly should move in as you  breathe out.  Learn and use controlled coughing to clear thick spit from your lungs. The steps are:  Lean your head a little forward.  Breathe in deeply.  Try to hold your breath for 3 seconds.  Keep your mouth slightly open while coughing 2 times.  Spit any thick spit out into a tissue.  Rest and do the steps again 1 or 2 times as needed. GET HELP IF:  You cough up more thick spit than usual.  There is a change in the color or thickness of the spit.  It is harder to breathe than usual.  Your breathing is faster than usual. GET HELP RIGHT AWAY IF:   You have shortness of breath while resting.  You have shortness of breath that stops you from:  Being able to talk.  Doing normal activities.  You chest hurts for longer than 5 minutes.  Your skin color is more blue than usual.  Your pulse oximeter shows that you have low oxygen for longer than 5 minutes. MAKE SURE YOU:   Understand these instructions.  Will watch your condition.  Will get help right away if you are not doing well or get worse. Document Released: 03/21/2008 Document Revised: 02/17/2014 Document Reviewed: 05/30/2013 [facility] Patient Information 2015 [place], [place]. This information is not intended to replace advice given to you by your health care provider. Make sure you discuss any questions you have with your health care provider. Smoking Cessation Quitting smoking is important to your health and has many advantages. However, it is not always easy to quit since nicotine is a very addictive drug. Oftentimes, people try 3 times or more before being able to quit. This document explains the best ways for you to prepare to quit smoking. Quitting takes hard work and a lot of effort, but you can do it. ADVANTAGES OF QUITTING SMOKING  You will live longer, feel better, and live better.  Your body will feel the impact of quitting smoking almost immediately.  Within 20 minutes, blood pressure decreases.  Your pulse returns to its normal level.  After 8 hours, carbon monoxide levels in the blood return to normal. Your oxygen level increases.  After 24 hours, the chance of having a heart attack starts to decrease. Your breath, hair, and body stop smelling like smoke.  After 48 hours, damaged nerve endings begin to recover. Your sense of taste and smell improve.  After 72 hours, the body is virtually free of nicotine. Your bronchial tubes relax and breathing becomes easier.  After 2 to 12 weeks, lungs can hold more air. Exercise becomes easier and circulation improves.  The risk of having a heart attack, stroke, cancer, or lung disease is greatly reduced.  After 1 year, the risk of coronary heart disease is cut in half.  After 5 years, the risk of stroke falls to the same as a nonsmoker.  After 10 years, the risk of lung cancer is cut in half and the risk of other cancers decreases significantly.  After 15 years, the  risk of coronary heart disease drops, usually to the level of a nonsmoker.  If you are pregnant, quitting smoking will improve your chances of having a healthy baby.  The people you live with, especially any children, will be healthier.  You will have extra money to spend on things other than cigarettes. QUESTIONS TO THINK ABOUT BEFORE ATTEMPTING TO QUIT You may want to talk about your answers with your health care provider.  Why do you want to quit?  If you tried to quit in the past, what helped and what did not?  What will be the most difficult situations for you after you quit? How will you plan to handle them?  Who can help you through the tough times? Your family? Friends? A health care provider?  What pleasures do you get from smoking? What ways can you still get pleasure if you quit? Here are some questions to ask your health care provider:  How can you help me to be successful at quitting?  What medicine do you think would be best for me and how should I  take it?  What should I do if I need more help?  What is smoking withdrawal like? How can I get information on withdrawal? GET READY  Set a quit date.  Change your environment by getting rid of all cigarettes, ashtrays, matches, and lighters in your home, car, or work. Do not let people smoke in your home.  Review your past attempts to quit. Think about what worked and what did not. GET SUPPORT AND ENCOURAGEMENT You have a better chance of being successful if you have help. You can get support in many ways.  Tell your family, friends, and coworkers that you are going to quit and need their support. Ask them not to smoke around you.  Get individual, group, or telephone counseling and support. Programs are available at [employer] and health centers. Call your local health department for information about programs in your area.  Spiritual beliefs and practices may help some smokers quit.  Download a "quit meter" on your computer to keep track of quit statistics, such as how long you have gone without smoking, cigarettes not smoked, and money saved.  Get a self-help book about quitting smoking and staying off tobacco. Oxford yourself from urges to smoke. Talk to someone, go for a walk, or occupy your time with a task.  Change your normal routine. Take a different route to work. Drink tea instead of coffee. Eat breakfast in a different place.  Reduce your stress. Take a hot bath, exercise, or read a book.  Plan something enjoyable to do every day. Reward yourself for not smoking.  Explore interactive web-based programs that specialize in helping you quit. GET MEDICINE AND USE IT CORRECTLY Medicines can help you stop smoking and decrease the urge to smoke. Combining medicine with the above behavioral methods and support can greatly increase your chances of successfully quitting smoking.  Nicotine replacement therapy helps deliver nicotine to your  body without the negative effects and risks of smoking. Nicotine replacement therapy includes nicotine gum, lozenges, inhalers, nasal sprays, and skin patches. Some may be available over-the-counter and others require a prescription.  Antidepressant medicine helps people abstain from smoking, but how this works is unknown. This medicine is available by prescription.  Nicotinic receptor partial agonist medicine simulates the effect of nicotine in your brain. This medicine is available by prescription. Ask your health care provider for  advice about which medicines to use and how to use them based on your health history. Your health care provider will tell you what side effects to look out for if you choose to be on a medicine or therapy. Carefully read the information on the package. Do not use any other product containing nicotine while using a nicotine replacement product.  RELAPSE OR DIFFICULT SITUATIONS Most relapses occur within the first 3 months after quitting. Do not be discouraged if you start smoking again. Remember, most people try several times before finally quitting. You may have symptoms of withdrawal because your body is used to nicotine. You may crave cigarettes, be irritable, feel very hungry, cough often, get headaches, or have difficulty concentrating. The withdrawal symptoms are only temporary. They are strongest when you first quit, but they will go away within 10-14 days. To reduce the chances of relapse, try to:  Avoid drinking alcohol. Drinking lowers your chances of successfully quitting.  Reduce the amount of caffeine you consume. Once you quit smoking, the amount of caffeine in your body increases and can give you symptoms, such as a rapid heartbeat, sweating, and anxiety.  Avoid smokers because they can make you want to smoke.  Do not let weight gain distract you. Many smokers will gain weight when they quit, usually less than 10 pounds. Eat a healthy diet and stay active.  You can always lose the weight gained after you quit.  Find ways to improve your mood other than smoking. FOR MORE INFORMATION  www.smokefree.gov  Document Released: 09/27/2001 Document Revised: 02/17/2014 Document Reviewed: 01/12/2012 [facility] Patient Information 2015 [place], [place]. This information is not intended to replace advice given to you by your health care provider. Make sure you discuss any questions you have with your health care provider.

## 2015-03-01 NOTE — ED Provider Notes (Addendum)
CSN: [id number]     Arrival date & time 03/01/15  1430 History   First MD Initiated Contact with Patient 03/01/15 1516     Chief Complaint  Patient presents with  . Wheezing  . Shortness of Breath  . Cough   (Consider location/radiation/quality/duration/timing/severity/associated sxs/prior Treatment) Patient is a 59 y.o. female presenting with wheezing, shortness of breath, and cough. The history is provided by the patient and the spouse. No language interpreter was used.  Wheezing Severity:  Severe Severity compared to prior episodes:  More severe Onset quality:  Unable to specify Duration:  5 days Progression:  Worsening Chronicity:  Chronic Context: medical treatments   Context: not emotional upset and not smoke exposure   Relieved by:  Nothing Worsened by:  Nothing tried Ineffective treatments:  Nebulizer treatments Associated symptoms: cough and shortness of breath   Risk factors: smoke inhalation   Risk factors: not exposed to toxic fumes and no prior hospitalizations   Risk factors comment:  Still smokes Shortness of Breath Associated symptoms: cough and wheezing   Cough Associated symptoms: shortness of breath and wheezing     Past Medical History  Diagnosis Date  . COPD (chronic obstructive pulmonary disease)   . Hypertension    Past Surgical History  Procedure Laterality Date  . Tubal ligation    . Abdominal hysterectomy     History reviewed. No pertinent family history. History  Substance Use Topics  . Smoking status: Current Every Day Smoker  . Smokeless tobacco: Not on file  . Alcohol Use: No   OB History    No data available     Review of Systems  Respiratory: Positive for cough, shortness of breath and wheezing.   Skin: Negative for color change and pallor.  All other systems reviewed and are negative.   Allergies  Codeine  Home Medications   Prior to Admission medications   Medication Sig Start Date End Date Taking? Authorizing  Provider  albuterol (PROVENTIL HFA;VENTOLIN HFA) 108 (90 BASE) MCG/ACT inhaler Inhale 2 puffs into the lungs every 6 (six) hours as needed for wheezing.   Yes Historical Provider, MD  Aspirin-Caffeine (BC FAST PAIN RELIEF ARTHRITIS) 1000-65 MG PACK Take 1 packet by mouth every 6 (six) hours as needed (Pain).   Yes Historical Provider, MD  bisoprolol-hydrochlorothiazide (ZIAC) 10-6.25 MG per tablet Take 1 tablet by mouth daily.   Yes Historical Provider, MD  estradiol (ESTRACE) 1 MG tablet Take 1 mg by mouth daily.   Yes Historical Provider, MD  fesoterodine (TOVIAZ) 8 MG TB24 tablet Take 8 mg by mouth daily.   Yes Historical Provider, MD  Fluticasone-Salmeterol (ADVAIR) 250-50 MCG/DOSE AEPB Inhale 1 puff into the lungs every 12 (twelve) hours.   Yes Historical Provider, MD  roflumilast (DALIRESP) 500 MCG TABS tablet Take 500 mcg by mouth daily.   Yes Historical Provider, MD  sertraline (ZOLOFT) 100 MG tablet Take 100 mg by mouth daily.   Yes Historical Provider, MD  tiotropium (SPIRIVA) 18 MCG inhalation capsule Place 18 mcg into inhaler and inhale daily.   Yes Historical Provider, MD  levofloxacin (LEVAQUIN) 500 MG tablet Take 1 tablet (500 mg total) by mouth daily. 03/01/15   Frederich Cha, MD  predniSONE (DELTASONE) 20 MG tablet 2 tabs po daily x 4 days 10/17/13   Elnora Morrison, MD  predniSONE (STERAPRED UNI-PAK 21 TAB) 10 MG (21) TBPK tablet All pills to be taken orally. 6 tablets day 1, 5 tablets daily 2, 4 tablets day 3, 3  tablets day 4, 2 tablets day 5, 1 tablet day 6 day 03/01/15   Frederich Cha, MD   BP 133/87 mmHg  Pulse 87  Temp(Src) 97.5 F (36.4 C) (Tympanic)  Resp 24  Ht _0  (1.6 m)  Wt 238 lb (107.956 kg)  BMI 42.17 kg/m2  SpO2 95% Physical Exam  Constitutional: She is oriented to person, place, and time. She appears well-developed and well-nourished.  Smells of smoke  HENT:  Head: Normocephalic.  Eyes: Pupils are equal, round, and reactive to light.  Neck: Neck supple. No  tracheal deviation present. No thyromegaly present.  Cardiovascular: Normal rate, regular rhythm and normal heart sounds.   Pulmonary/Chest: She has decreased breath sounds. She has wheezes.  Musculoskeletal: Normal range of motion.  Lymphadenopathy:    She has no cervical adenopathy.  Neurological: She is alert and oriented to person, place, and time.  Skin: Skin is warm.  Vitals reviewed.   ED Course  Procedures (including critical care time) Labs Review Labs Reviewed - No data to display Solumedrol 125 mg given to the patient as well. Imaging Review Dg Chest 2 View  03/01/2015   CLINICAL DATA:  Shortness of breath.  EXAM: CHEST  2 VIEW  COMPARISON:  01/22/2015 and prior chest radiographs dating back to 07/18/2011  FINDINGS: Mild cardiomegaly again noted.  COPD/emphysema changes again identified.  There is no evidence of focal airspace disease, pulmonary edema, suspicious pulmonary nodule/mass, pleural effusion, or pneumothorax. No acute bony abnormalities are identified.  IMPRESSION: No evidence of acute cardiopulmonary disease.  Cardiomegaly and COPD/ emphysema.   Electronically Signed   By: Margarette Canada M.D.   On: 03/01/2015 17:00   Warned that she needs to stop smoking. We'll place on Levaquin. Prednisone for 6 days.  MDM   1. Bronchitis, acute, with bronchospasm   2. COPD with acute exacerbation        Frederich Cha, MD 03/01/15 [place], MD 03/18/15 1758

## 2015-03-23 IMAGING — NM NUCLEAR MEDICINE HEPATOHBILIARY INCLUDE GB
2 series · 14 of 14 positions shown · non-contrast
Comparison: Ultrasound 09/08/2014.

CLINICAL DATA: Abdominal pain. Nausea. Periumbilical pain.
Epigastric pain. Subsequent encounter.

EXAM:
NUCLEAR MEDICINE HEPATOBILIARY IMAGING WITH GALLBLADDER EF
TECHNIQUE: Sequential images of the abdomen were obtained [DATE] minutes
following intravenous administration of radiopharmaceutical. After
slow intravenous infusion of 2.14 micrograms Cholecystokinin,
gallbladder ejection fraction was determined.
RADIOPHARMACEUTICALS:  8.01 Millicurie Mc-44m Choletec

[Series 1000: gallbladder ef · 4.80mm/px · 6 of 120 frames shown]
[frame 11/120]
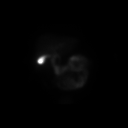
[frame 31/120]
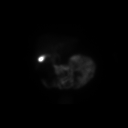
[frame 51/120]
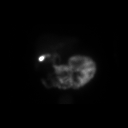
[frame 71/120]
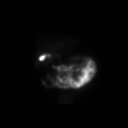
[frame 91/120]
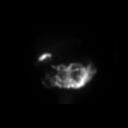
[frame 111/120]
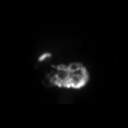

[Series 1000: gallbladder statics · 4.80mm/px · 8 of 8 slices shown]
[im 1/8]
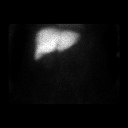
[im 2/8]
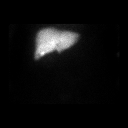
[im 3/8]
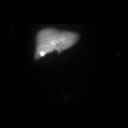
[im 4/8]
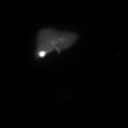
[im 5/8]
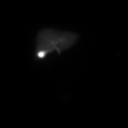
[im 6/8]
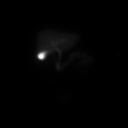
[im 7/8]
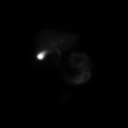
[im 8/8]
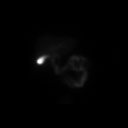

[14 of 14 positions shown; findings below may reference images not displayed]

FINDINGS: There is normal hepatic radiotracer uptake. Normal filling of the
common bile duct and gallbladder. Normal spill of radiotracer into
bowel. Gallbladder and common bile duct visible at 5 min..
Gallbladder ejection fraction was 65%. At 30 min, normal ejection
fraction is greater than 30%.

The patient did not symptoms during CCK infusion.
IMPRESSION: Normal HIDA scan with CCK. No reproduction of symptoms with CCK
administration.

## 2015-03-23 IMAGING — US ABDOMEN ULTRASOUND
1 series · 14 of 25 positions shown · non-contrast
Comparison: CT 07/21/2014

CLINICAL DATA: Epigastric Abd Pain Nausea Periumbilical Pain since
beginning of the year

EXAM:
COMPLETE ABDOMINAL ULTRASOUND

[Series 1: abdomen ultrasound · 0.28mm/px · 14 of 106 slices shown]
[im 1/106]
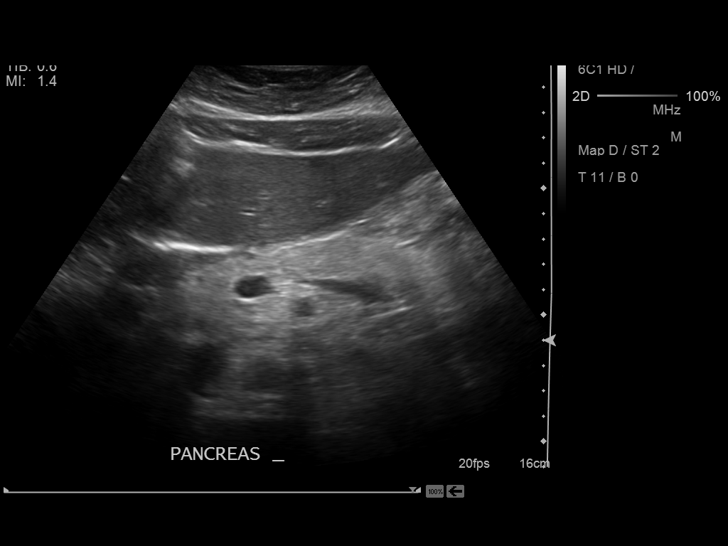
[im 9/106]
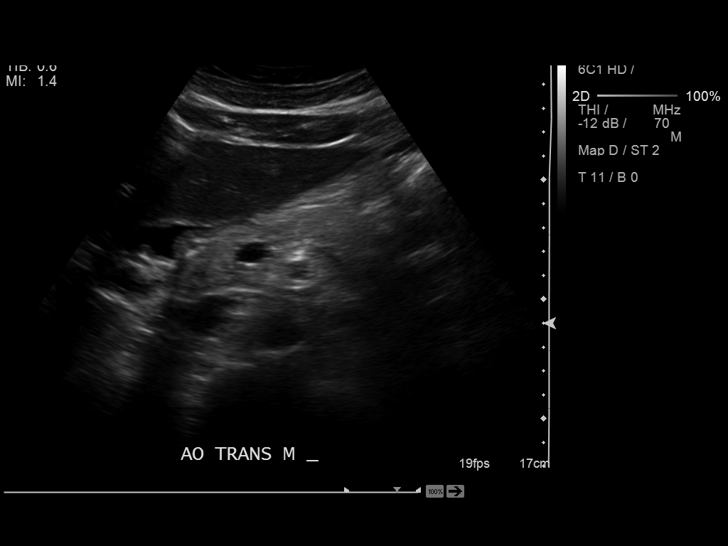
[im 18/106]
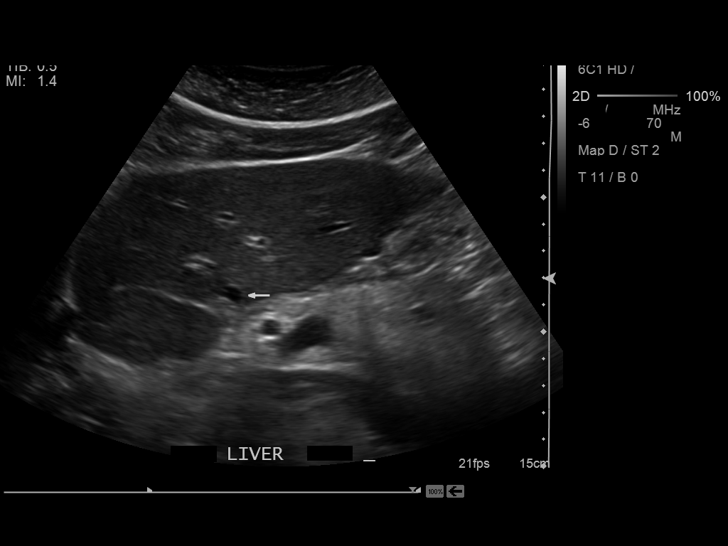
[im 27/106]
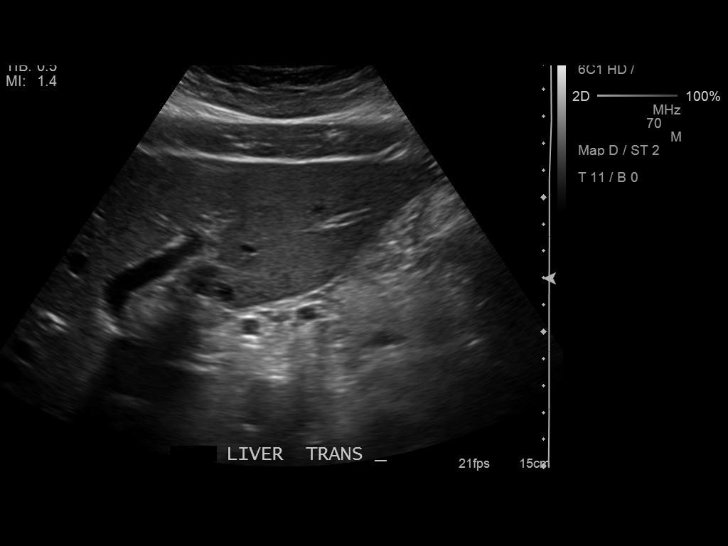
[im 36/106]
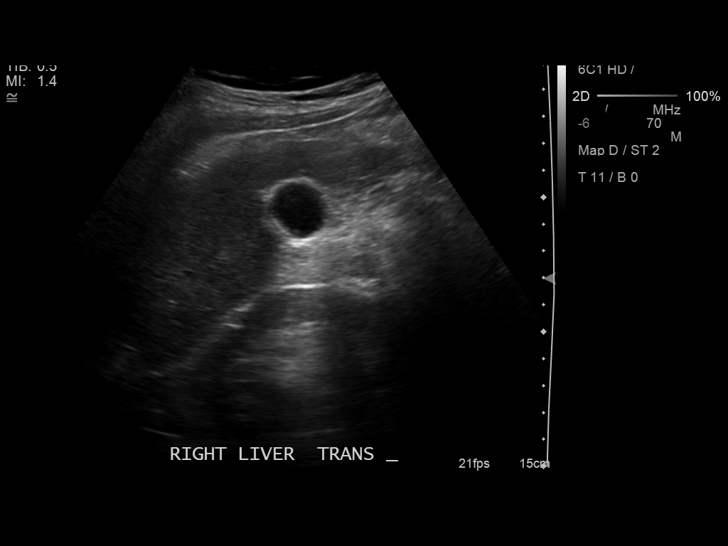
[im 40/106]
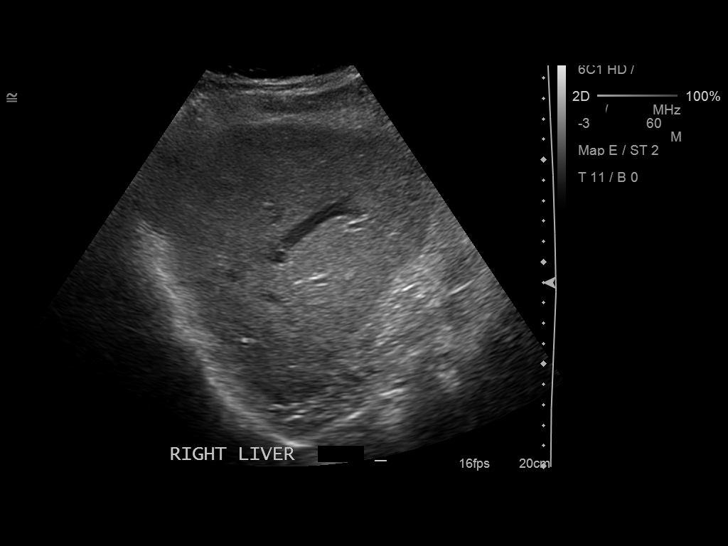
[im 49/106]
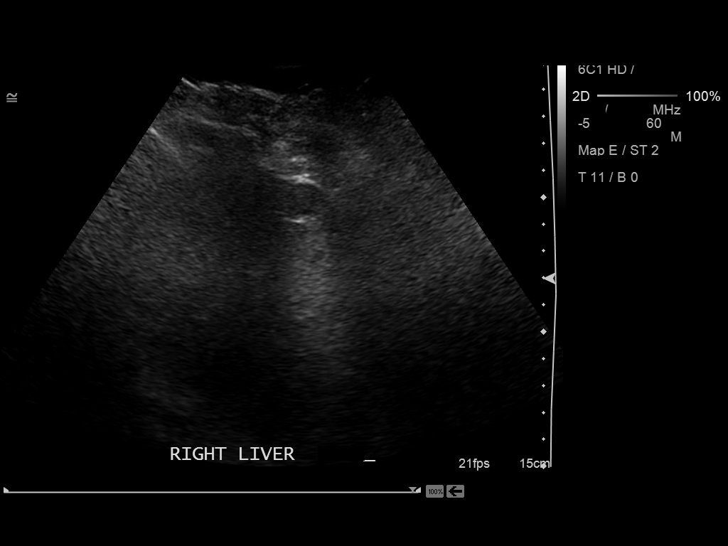
[im 57/106]
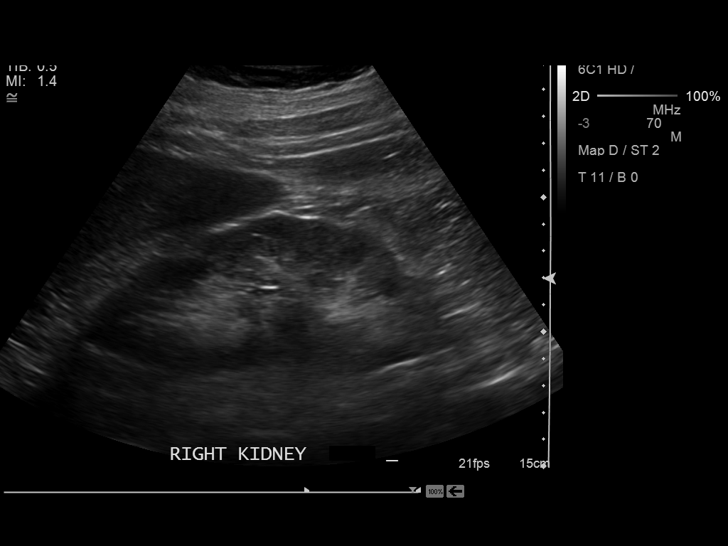
[im 66/106]
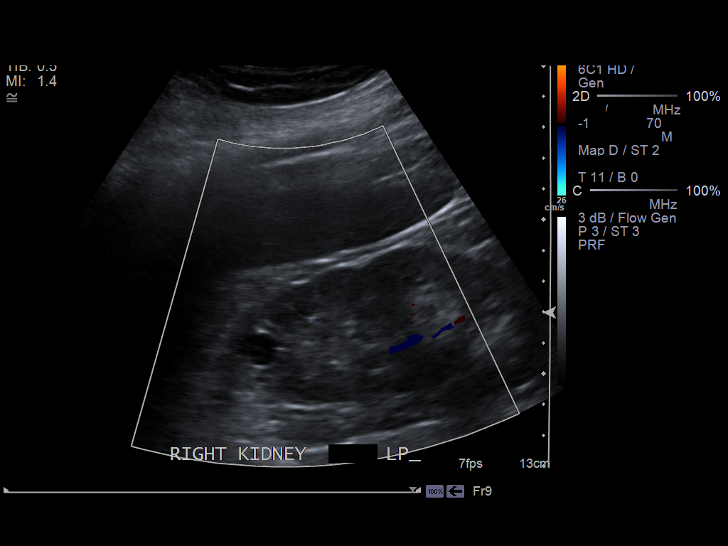
[im 71/106]
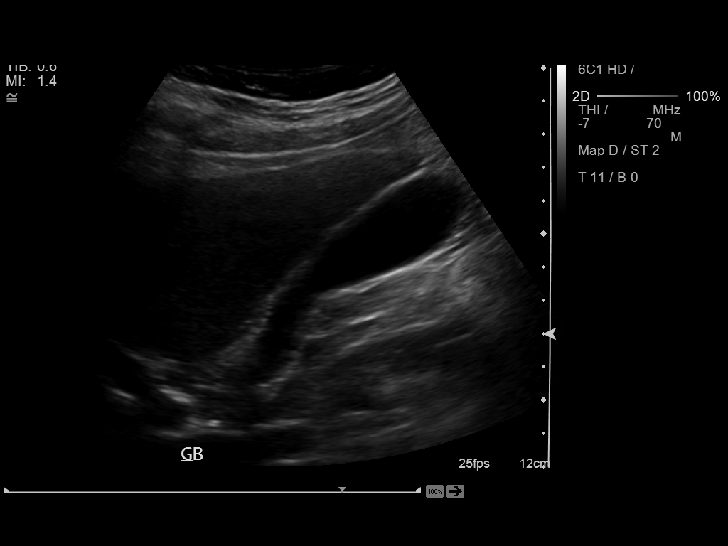
[im 79/106]
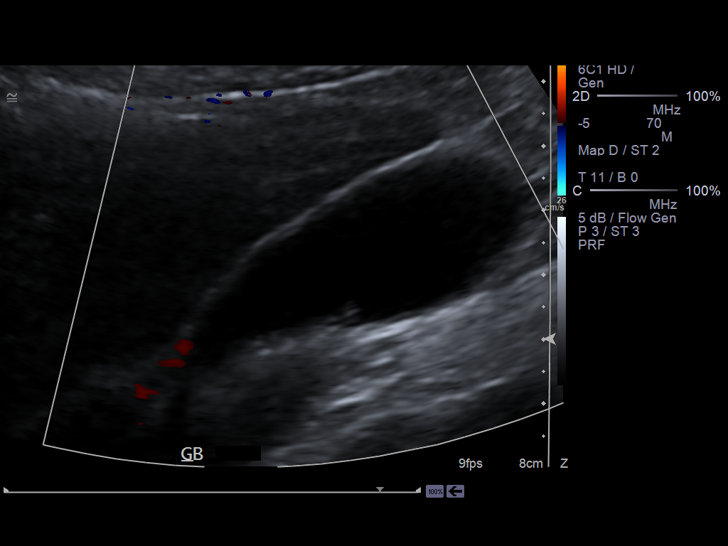
[im 88/106]
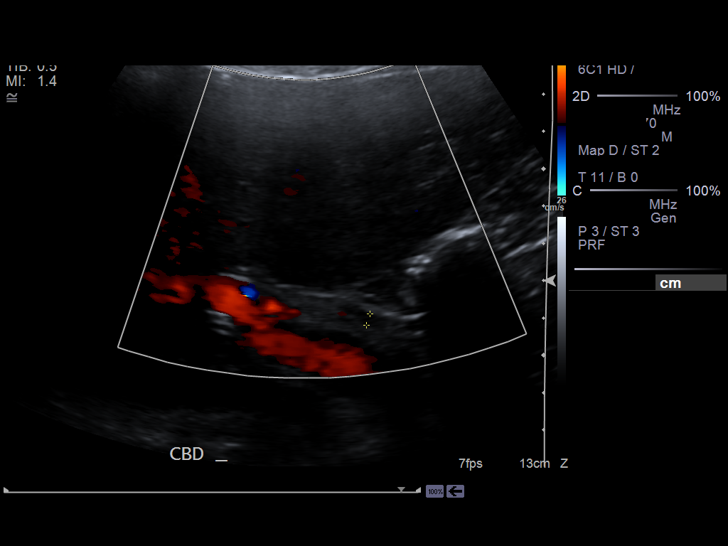
[im 97/106]
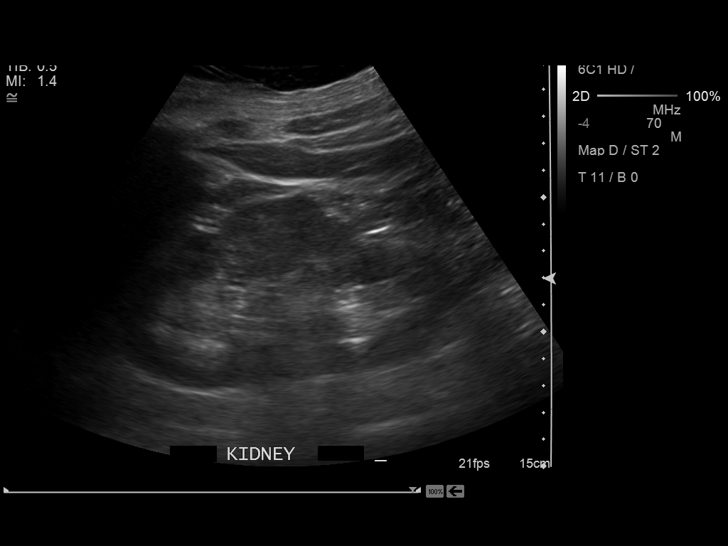
[im 106/106]
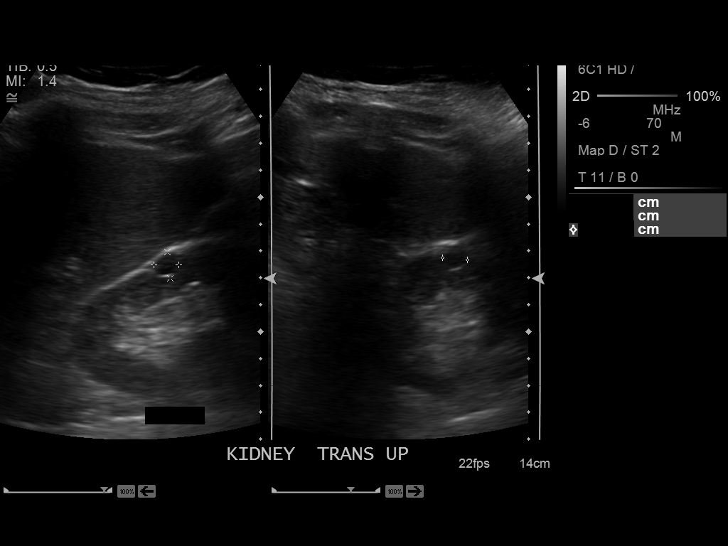

[14 of 25 positions shown; findings below may reference images not displayed]

FINDINGS: Gallbladder: Physiologically distended without stones, wall
thickening, or pericholecystic fluid. There is a 3 mm polyp.
Sonographer reports no sonographic Murphy's sign.

Common bile duct:  Normal in caliber, 3.2mm diameter.

Liver: 17 mm hypoechoic focus with sharply defined wall and some
increased through transmission corresponding to the segment 5 cyst
on prior CT. The smaller cysts are not well visualized. Homogeneous
in echotexture without intrahepatic bile duct dilatation.

IVC:  Negative

Pancreas:  Negative

Spleen:  No focal lesion, craniocaudal 8.6 cm  in length.

Right Kidney: No mass or hydronephrosis, 13.7cm in length. 14 x 12
mm cyst in the upper pole.

Left Kidney: No lesion or hydronephrosis, 13.0cm in length. 10 x 9
mm cyst, upper pole.

Abdominal aorta:  Negative
IMPRESSION: 1. Negative for nephrolithiasis or other acute abnormality
2. 3 mm gallbladder wall cyst, of doubtful clinical significance.
3. Small hepatic and renal cysts.

## 2015-03-24 ENCOUNTER — Ambulatory Visit: Payer: Medicare PPO

## 2015-03-26 ENCOUNTER — Encounter: Payer: Self-pay | Admitting: *Deleted

## 2015-03-29 ENCOUNTER — Other Ambulatory Visit: Payer: Self-pay | Admitting: Urology

## 2015-03-29 DIAGNOSIS — R3129 Other microscopic hematuria: Secondary | ICD-10-CM

## 2015-03-30 ENCOUNTER — Telehealth: Payer: Self-pay | Admitting: Urology

## 2015-03-30 ENCOUNTER — Ambulatory Visit: Payer: Self-pay | Admitting: Urology

## 2015-03-30 NOTE — Telephone Encounter (Signed)
Please call patient to reschedule her missed appointment.

## 2015-03-31 NOTE — Telephone Encounter (Signed)
Patient has been rescheduled.

## 2015-04-01 IMAGING — MG MM DIGITAL SCREENING BILAT W/ CAD
4 series · 4 of 4 positions shown · non-contrast
Comparison: Previous exam(s).

CLINICAL DATA: Screening.

EXAM:
DIGITAL SCREENING BILATERAL MAMMOGRAM WITH CAD

[R CC]
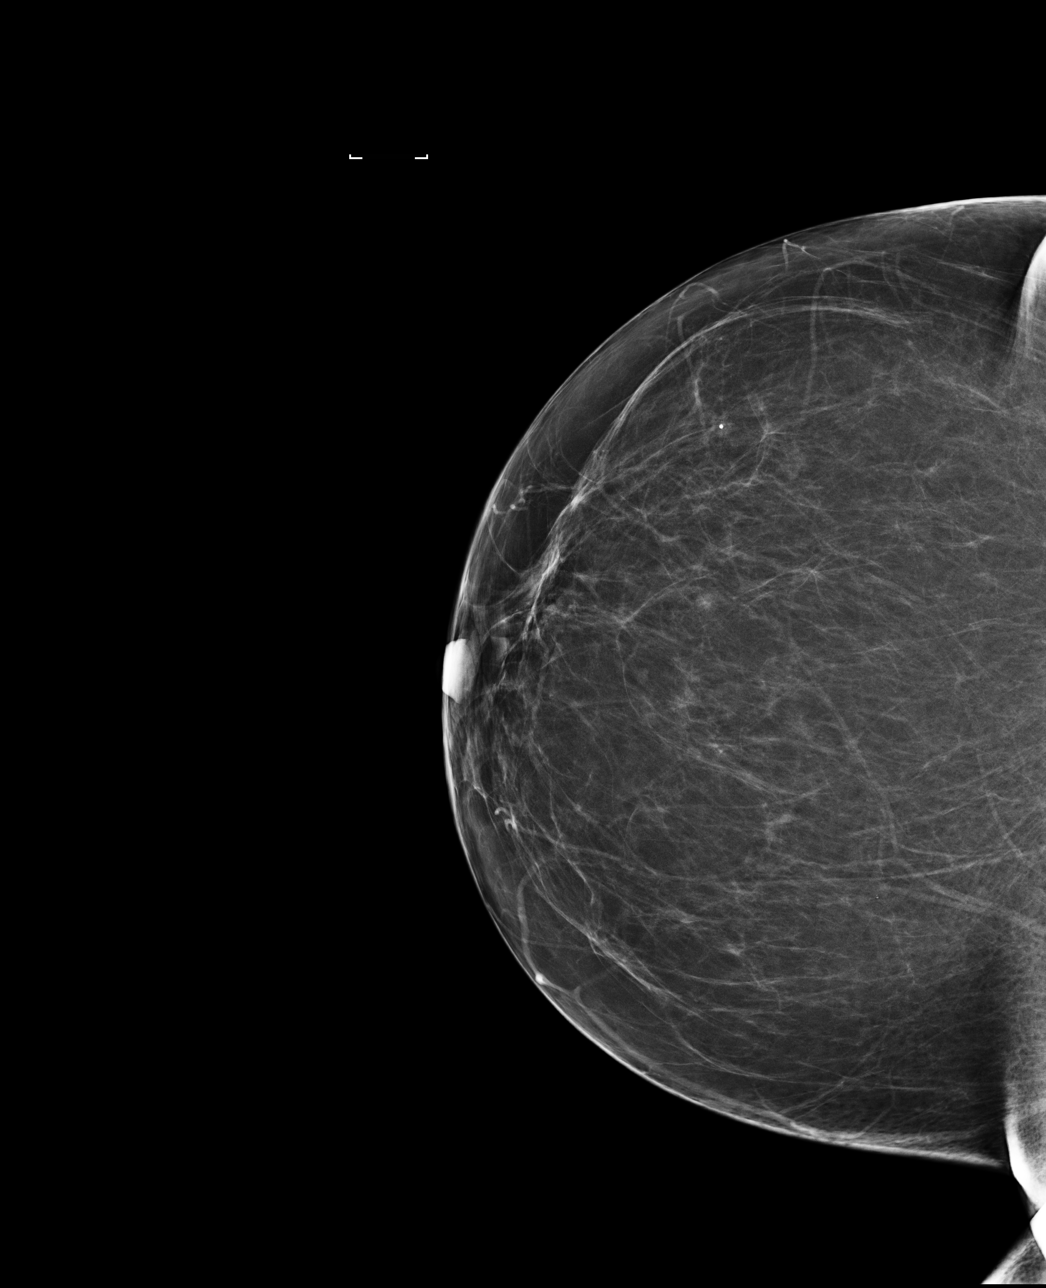

[R MLO]
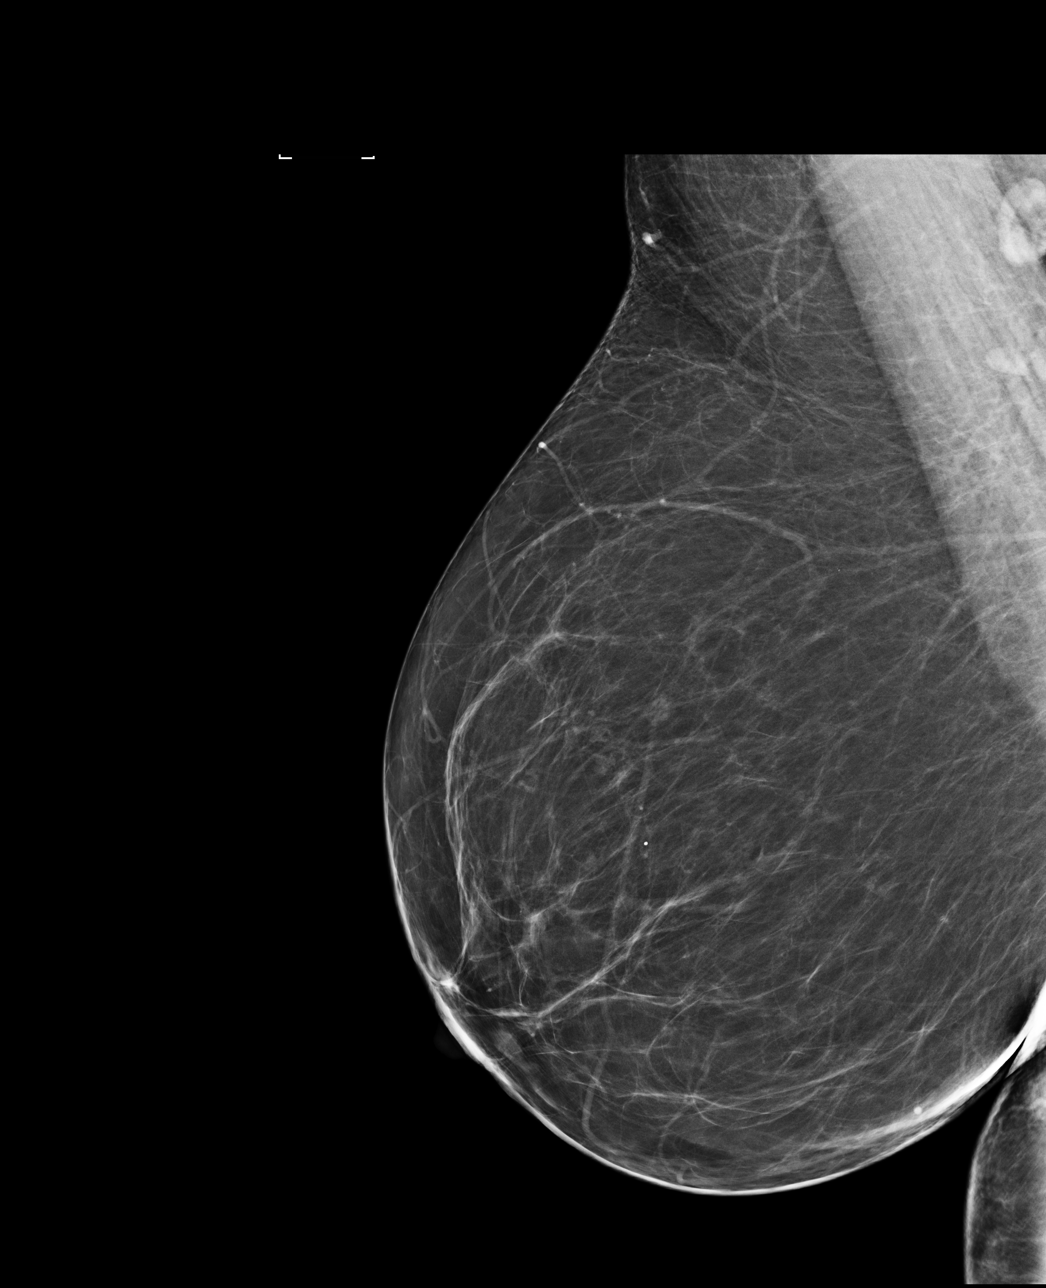

[L CC]
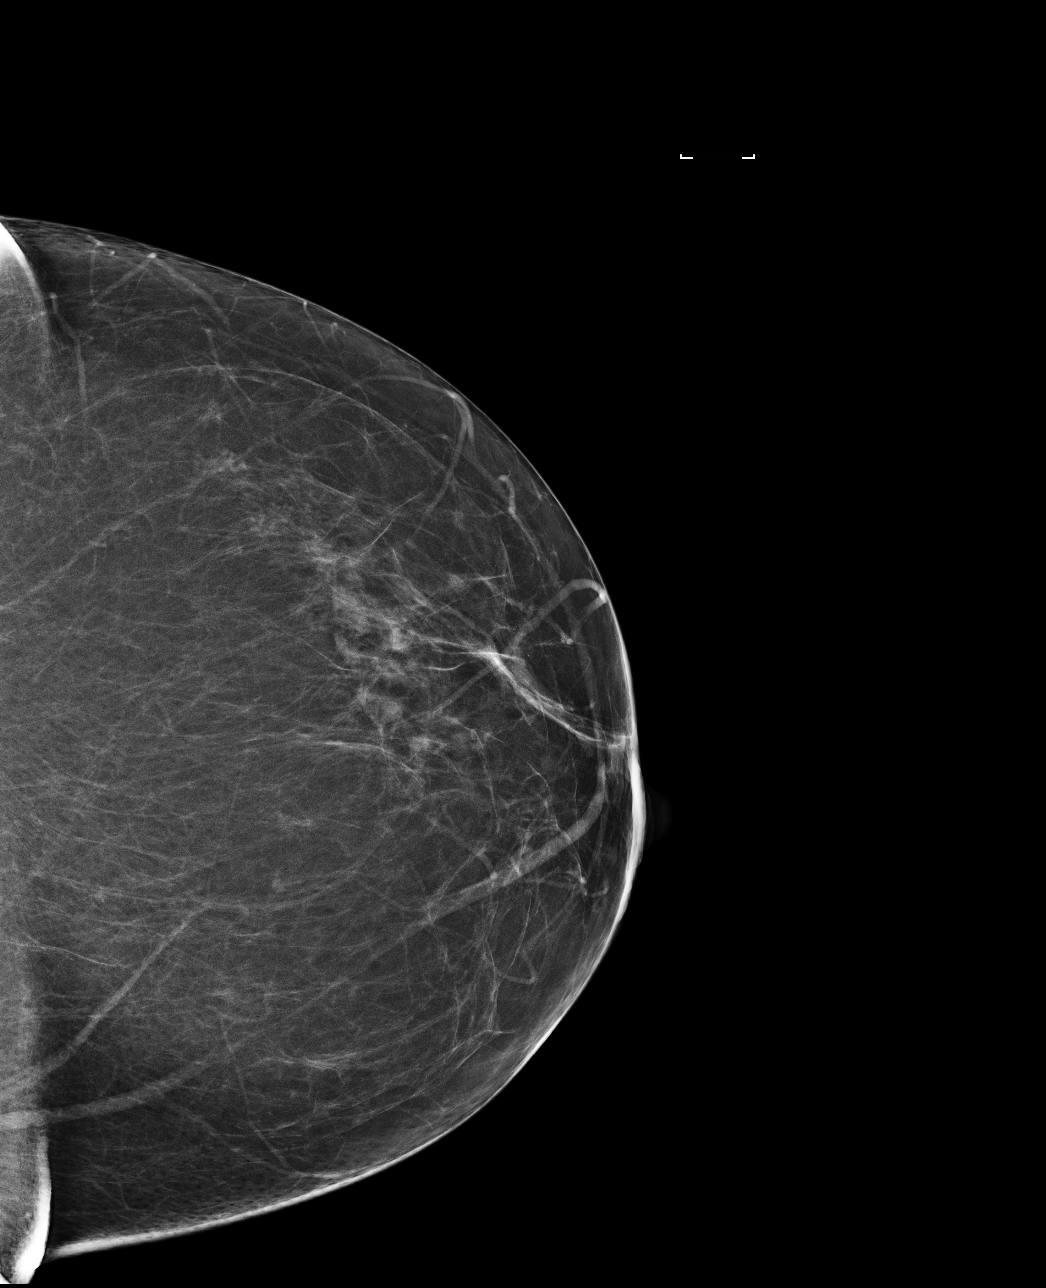

[L MLO]
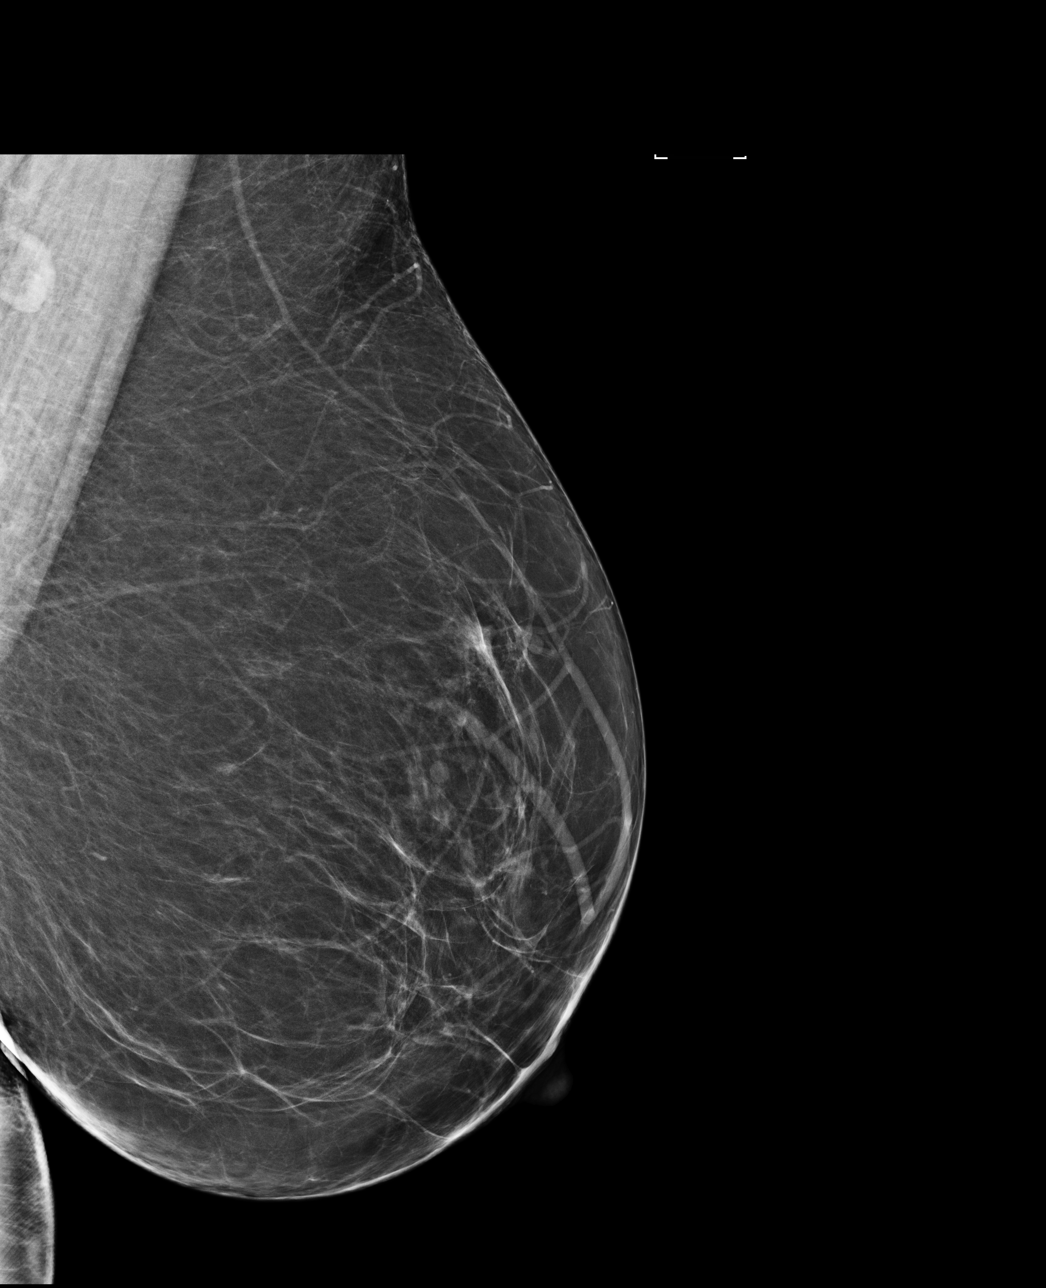

[4 of 4 positions shown; findings below may reference images not displayed]

ACR Breast Density Category b: There are scattered areas of
fibroglandular density.
FINDINGS: There are no findings suspicious for malignancy. Images were
processed with CAD.
IMPRESSION: No mammographic evidence of malignancy. A result letter of this
screening mammogram will be mailed directly to the patient.

RECOMMENDATION:
Screening mammogram in one year. (Code:AS-G-LCT)

BI-RADS CATEGORY  1: Negative.

## 2015-04-15 IMAGING — CR DG CHEST 2V
1 series · 2 of 2 positions shown · non-contrast
Comparison: 06/10/2014.

CLINICAL DATA: Initial encounter for worsening shortness of breath
over 2 days in a patient with personal history of COPD.

EXAM:
CHEST  2 VIEW

[Series 1: dxr chest pa (or ap) and lateral · 0.14mm/px · 2 of 2 slices shown]
[im 1/2]
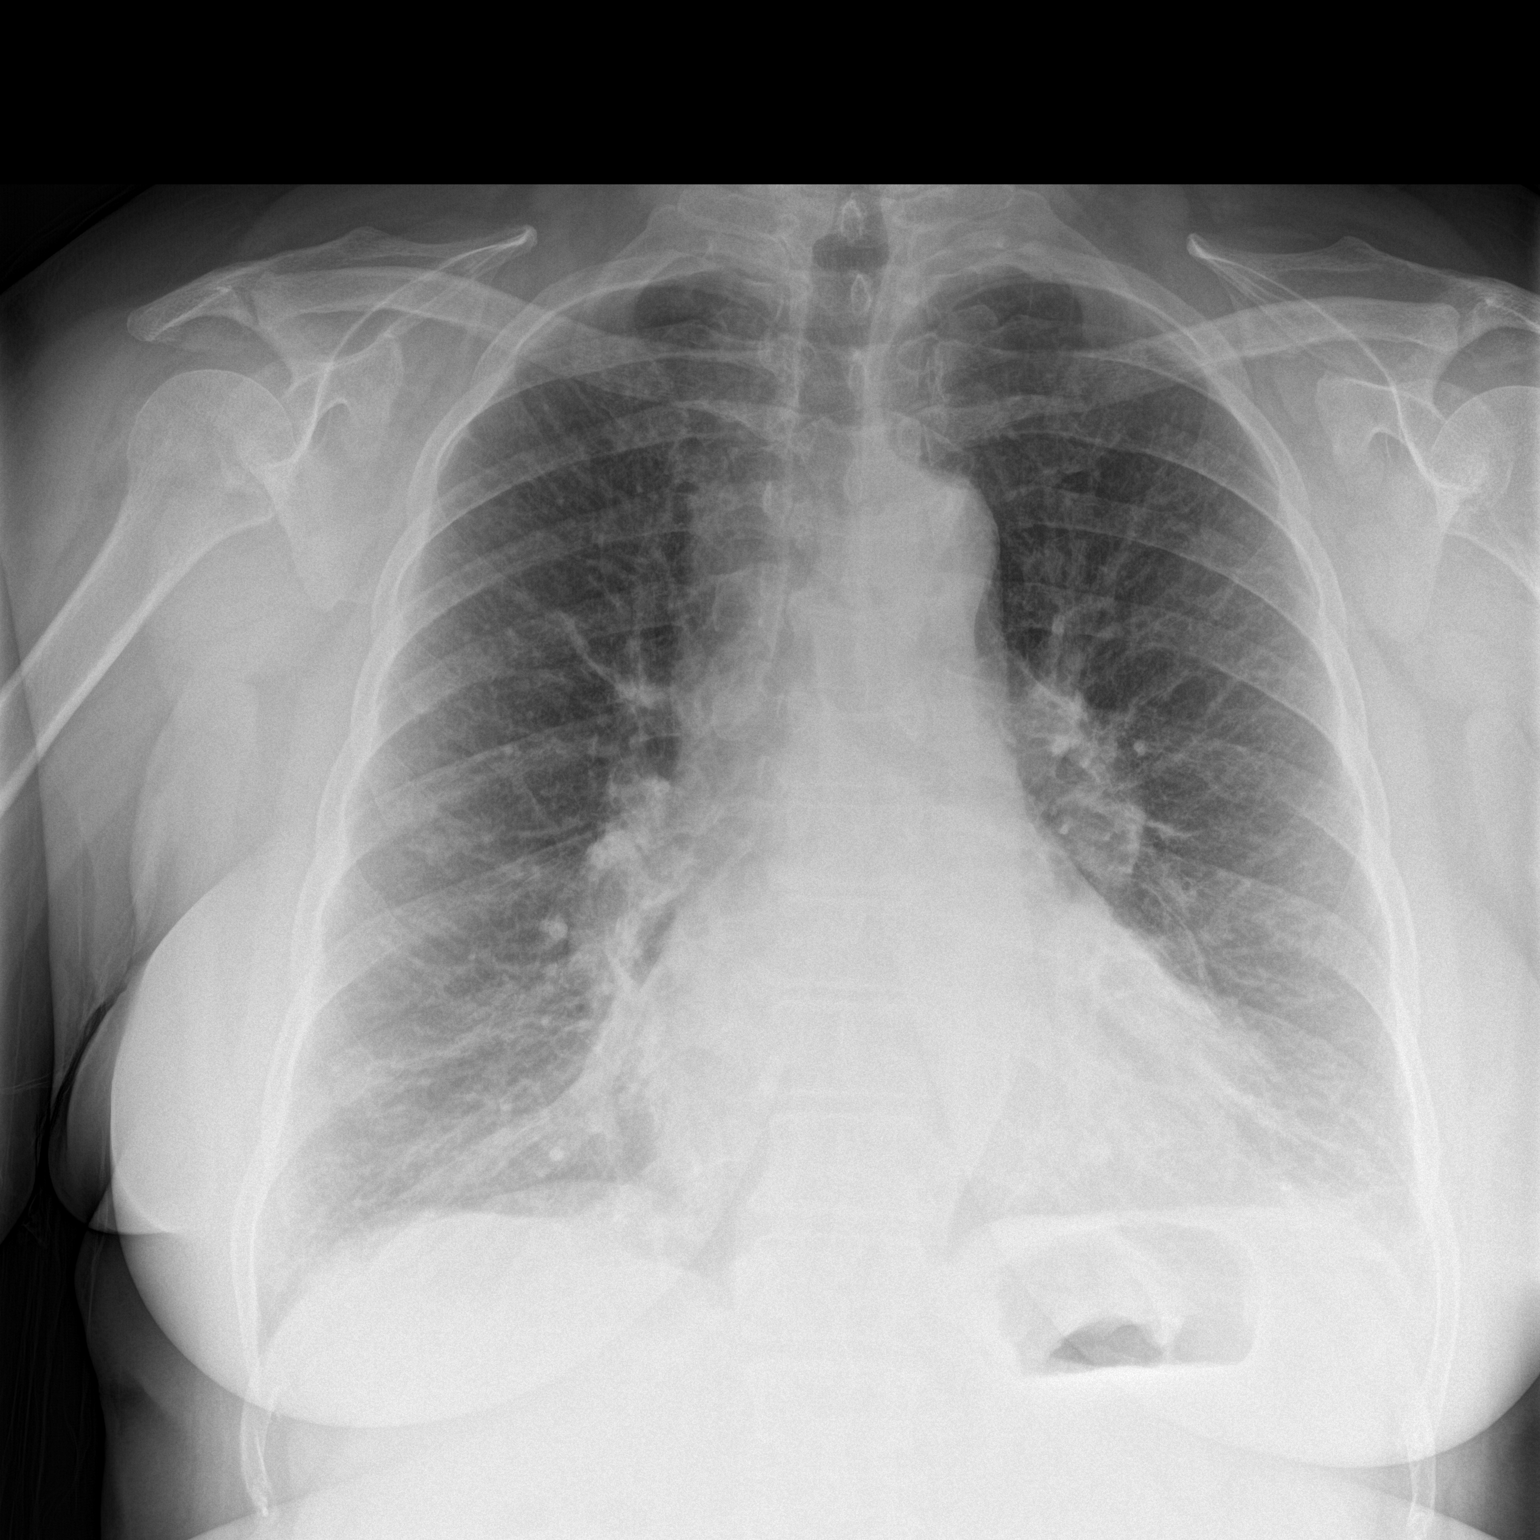
[im 2/2]
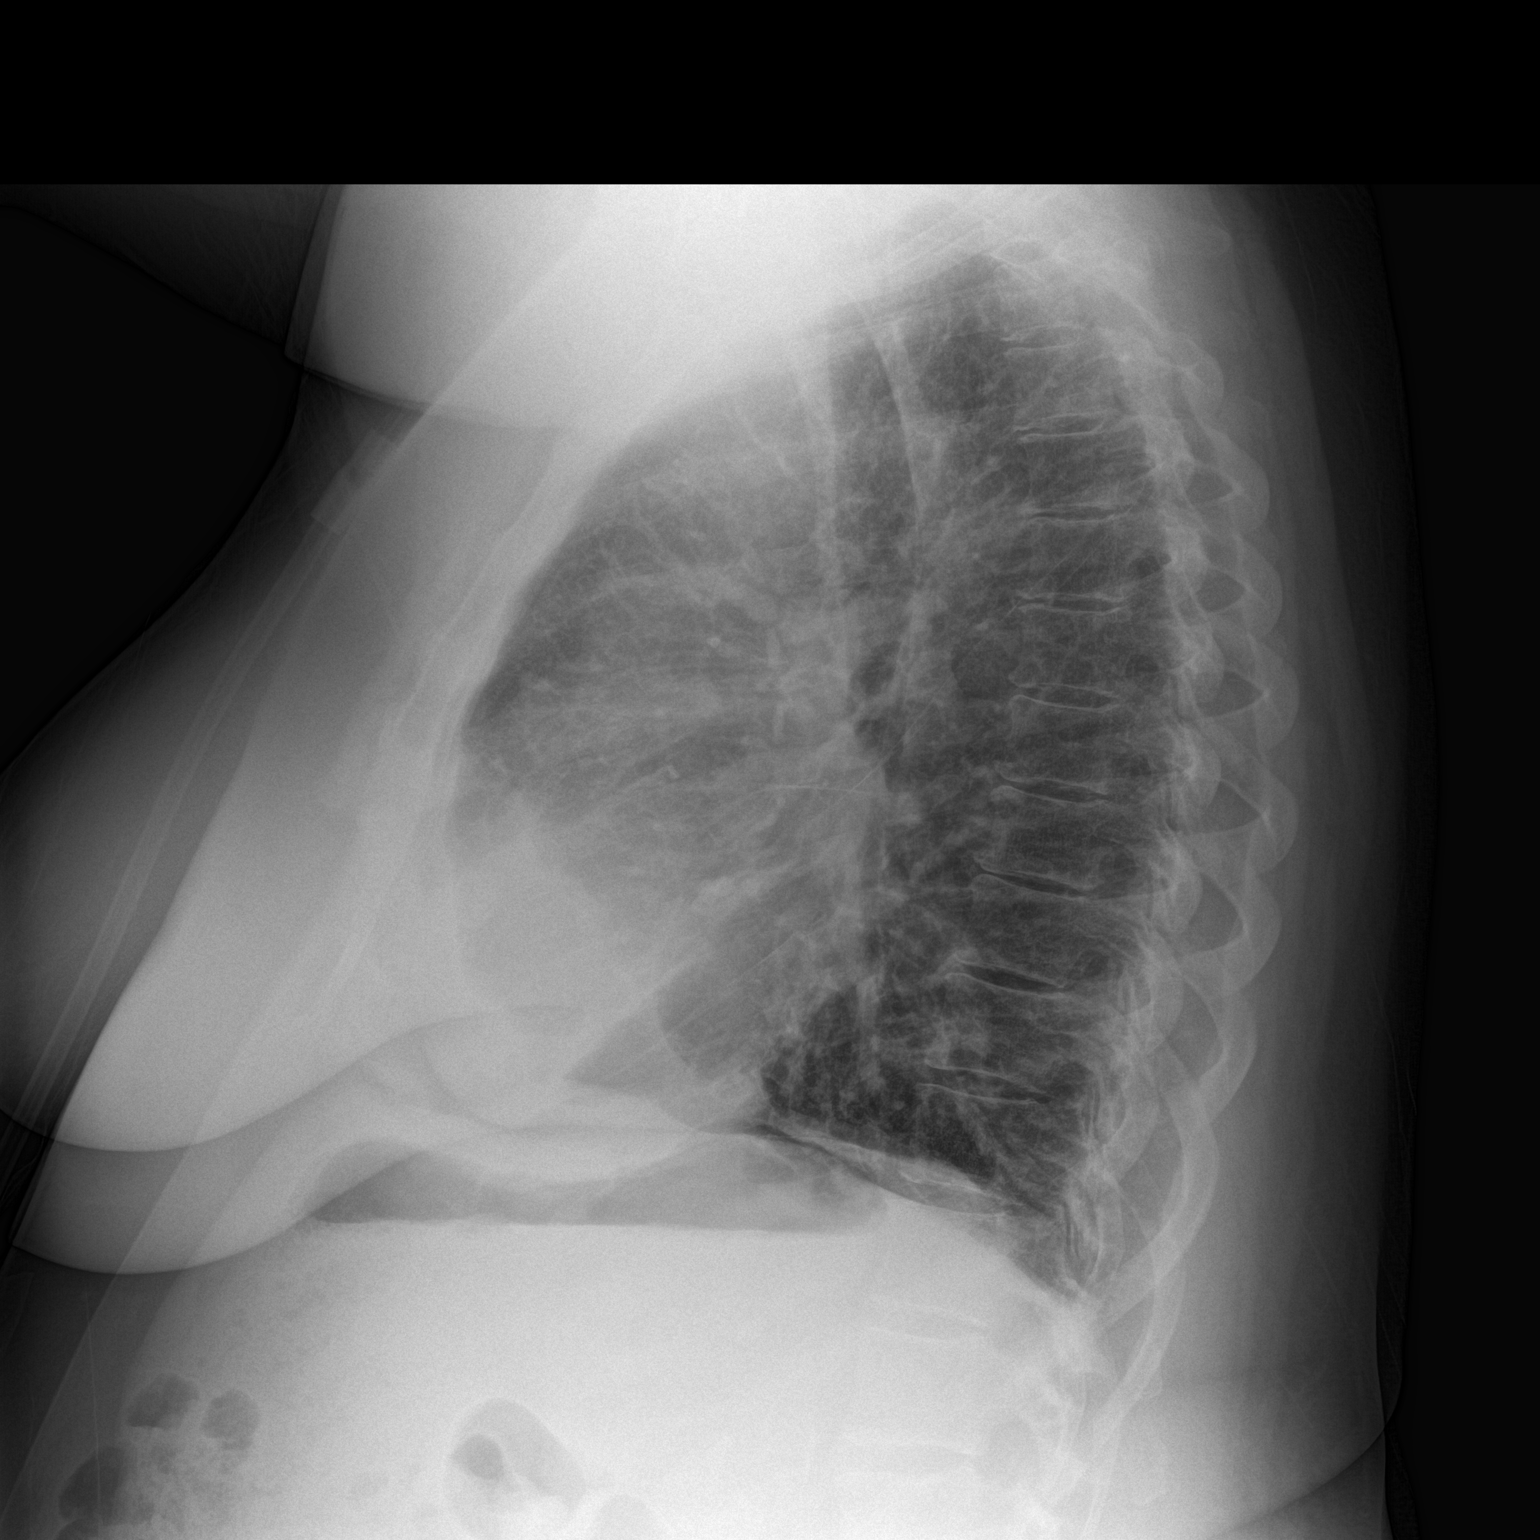

[2 of 2 positions shown; findings below may reference images not displayed]

FINDINGS: Hyper expansion is consistent with emphysema. Interstitial markings
are diffusely coarsened with chronic features. No evidence for
pulmonary edema. No focal airspace consolidation. No pleural
effusion. Cardiopericardial silhouette is at upper limits of normal
for size. Imaged bony structures of the thorax are intact.
IMPRESSION: Stable.  No new or acute cardiopulmonary findings.

Hyperinflation compatible with emphysema.

## 2015-04-16 ENCOUNTER — Encounter: Payer: Self-pay | Admitting: Urology

## 2015-04-16 ENCOUNTER — Ambulatory Visit: Payer: Self-pay | Admitting: Urology

## 2015-04-18 ENCOUNTER — Emergency Department: Payer: Medicare PPO

## 2015-04-18 ENCOUNTER — Inpatient Hospital Stay
Admission: EM | Admit: 2015-04-18 | Discharge: 2015-04-21 | DRG: 189 | Disposition: A | Discharge status: Home / Self Care | Payer: Medicare PPO | Attending: Internal Medicine | Admitting: Internal Medicine

## 2015-04-18 ENCOUNTER — Encounter: Payer: Self-pay | Admitting: Emergency Medicine

## 2015-04-18 DIAGNOSIS — Z7982 Long term (current) use of aspirin: Secondary | ICD-10-CM | POA: Diagnosis not present

## 2015-04-18 DIAGNOSIS — J9622 Acute and chronic respiratory failure with hypercapnia: Principal | ICD-10-CM | POA: Diagnosis present

## 2015-04-18 DIAGNOSIS — Z6841 Body Mass Index (BMI) 40.0 and over, adult: Secondary | ICD-10-CM

## 2015-04-18 DIAGNOSIS — I159 Secondary hypertension, unspecified: Secondary | ICD-10-CM | POA: Diagnosis not present

## 2015-04-18 DIAGNOSIS — Z885 Allergy status to narcotic agent status: Secondary | ICD-10-CM

## 2015-04-18 DIAGNOSIS — Z7951 Long term (current) use of inhaled steroids: Secondary | ICD-10-CM | POA: Diagnosis not present

## 2015-04-18 DIAGNOSIS — I5033 Acute on chronic diastolic (congestive) heart failure: Secondary | ICD-10-CM | POA: Diagnosis present

## 2015-04-18 DIAGNOSIS — Z90710 Acquired absence of both cervix and uterus: Secondary | ICD-10-CM

## 2015-04-18 DIAGNOSIS — E872 Acidosis: Secondary | ICD-10-CM | POA: Diagnosis present

## 2015-04-18 DIAGNOSIS — F17210 Nicotine dependence, cigarettes, uncomplicated: Secondary | ICD-10-CM | POA: Diagnosis present

## 2015-04-18 DIAGNOSIS — G9340 Encephalopathy, unspecified: Secondary | ICD-10-CM | POA: Diagnosis not present

## 2015-04-18 DIAGNOSIS — Z9889 Other specified postprocedural states: Secondary | ICD-10-CM | POA: Diagnosis not present

## 2015-04-18 DIAGNOSIS — I10 Essential (primary) hypertension: Secondary | ICD-10-CM | POA: Diagnosis present

## 2015-04-18 DIAGNOSIS — J441 Chronic obstructive pulmonary disease with (acute) exacerbation: Secondary | ICD-10-CM

## 2015-04-18 DIAGNOSIS — Z9851 Tubal ligation status: Secondary | ICD-10-CM | POA: Diagnosis not present

## 2015-04-18 DIAGNOSIS — Z792 Long term (current) use of antibiotics: Secondary | ICD-10-CM | POA: Diagnosis not present

## 2015-04-18 DIAGNOSIS — Z801 Family history of malignant neoplasm of trachea, bronchus and lung: Secondary | ICD-10-CM

## 2015-04-18 DIAGNOSIS — I2510 Atherosclerotic heart disease of native coronary artery without angina pectoris: Secondary | ICD-10-CM | POA: Diagnosis present

## 2015-04-18 DIAGNOSIS — F329 Major depressive disorder, single episode, unspecified: Secondary | ICD-10-CM | POA: Diagnosis present

## 2015-04-18 DIAGNOSIS — Z79891 Long term (current) use of opiate analgesic: Secondary | ICD-10-CM | POA: Diagnosis not present

## 2015-04-18 DIAGNOSIS — Z79899 Other long term (current) drug therapy: Secondary | ICD-10-CM | POA: Diagnosis not present

## 2015-04-18 DIAGNOSIS — E669 Obesity, unspecified: Secondary | ICD-10-CM | POA: Diagnosis present

## 2015-04-18 DIAGNOSIS — J9612 Chronic respiratory failure with hypercapnia: Secondary | ICD-10-CM | POA: Diagnosis present

## 2015-04-18 DIAGNOSIS — Z8249 Family history of ischemic heart disease and other diseases of the circulatory system: Secondary | ICD-10-CM

## 2015-04-18 DIAGNOSIS — Z9981 Dependence on supplemental oxygen: Secondary | ICD-10-CM | POA: Diagnosis not present

## 2015-04-18 DIAGNOSIS — J310 Chronic rhinitis: Secondary | ICD-10-CM | POA: Diagnosis present

## 2015-04-18 DIAGNOSIS — M5030 Other cervical disc degeneration, unspecified cervical region: Secondary | ICD-10-CM | POA: Diagnosis present

## 2015-04-18 DIAGNOSIS — J9602 Acute respiratory failure with hypercapnia: Secondary | ICD-10-CM | POA: Diagnosis not present

## 2015-04-18 DIAGNOSIS — Z886 Allergy status to analgesic agent status: Secondary | ICD-10-CM

## 2015-04-18 LAB — COMPREHENSIVE METABOLIC PANEL
ALT: 17 U/L (ref 14–54)
AST: 19 U/L (ref 15–41)
Albumin: 3.7 g/dL (ref 3.5–5.0)
Alkaline Phosphatase: 72 U/L (ref 38–126)
Anion gap: 9 (ref 5–15)
BUN: 23 mg/dL — ABNORMAL HIGH (ref 6–20)
CO2: 33 mmol/L — ABNORMAL HIGH (ref 22–32)
Calcium: 9.1 mg/dL (ref 8.9–10.3)
Chloride: 103 mmol/L (ref 101–111)
Creatinine, Ser: 0.79 mg/dL (ref 0.44–1.00)
GFR calc Af Amer: >60 mL/min (ref >60)
GFR calc non Af Amer: >60 mL/min (ref >60)
Glucose, Bld: 121 mg/dL — ABNORMAL HIGH (ref 65–99)
Potassium: 3.9 mmol/L (ref 3.5–5.1)
Sodium: 145 mmol/L (ref 135–145)
Total Bilirubin: 0.3 mg/dL (ref 0.3–1.2)
Total Protein: 6.7 g/dL (ref 6.5–8.1)

## 2015-04-18 LAB — CBC
HCT: 49.9 % — ABNORMAL HIGH (ref 35.0–47.0)
Hemoglobin: 16.1 g/dL — ABNORMAL HIGH (ref 12.0–16.0)
MCH: 32.5 pg (ref 26.0–34.0)
MCHC: 32.2 g/dL (ref 32.0–36.0)
MCV: 100.8 fL — ABNORMAL HIGH (ref 80.0–100.0)
Platelets: 153 10*3/uL (ref 150–440)
RBC: 4.95 MIL/uL (ref 3.80–5.20)
RDW: 15.7 % — ABNORMAL HIGH (ref 11.5–14.5)
WBC: 11 10*3/uL (ref 3.6–11.0)

## 2015-04-18 LAB — TROPONIN I: Troponin I: <0.03 ng/mL (ref <0.031)

## 2015-04-18 LAB — GLUCOSE, CAPILLARY: Glucose-Capillary: 102 mg/dL — ABNORMAL HIGH (ref 65–99)

## 2015-04-18 LAB — TSH: TSH: 0.993 u[IU]/mL (ref 0.350–4.500)

## 2015-04-18 MED ORDER — HEPARIN SODIUM (PORCINE) 5000 UNIT/ML IJ SOLN
5000.0000 [IU] | Freq: Three times a day (TID) | INTRAMUSCULAR | Status: DC
  Administered 2015-04-18 – 2015-04-21 (×11): 5000 [IU] via SUBCUTANEOUS
  Filled 2015-04-18 (×10): qty 1

## 2015-04-18 MED ORDER — FESOTERODINE FUMARATE ER 8 MG PO TB24
8.0000 mg | ORAL_TABLET | Freq: Every day | ORAL | Status: DC
  Filled 2015-04-18 (×4): qty 1

## 2015-04-18 MED ORDER — SUCRALFATE 1 G PO TABS
1.0000 g | ORAL_TABLET | Freq: Two times a day (BID) | ORAL | Status: DC
  Administered 2015-04-19 – 2015-04-20 (×4): 1 g via ORAL
  Filled 2015-04-18 (×5): qty 1

## 2015-04-18 MED ORDER — IPRATROPIUM-ALBUTEROL 0.5-2.5 (3) MG/3ML IN SOLN
3.0000 mL | Freq: Once | RESPIRATORY_TRACT | Status: AC
  Administered 2015-04-18: 3 mL via RESPIRATORY_TRACT

## 2015-04-18 MED ORDER — ROFLUMILAST 500 MCG PO TABS: 500.0000 ug | ORAL_TABLET | Freq: Every day | ORAL | Status: DC

## 2015-04-18 MED ORDER — IPRATROPIUM-ALBUTEROL 0.5-2.5 (3) MG/3ML IN SOLN
RESPIRATORY_TRACT | Status: AC
  Administered 2015-04-18: 3 mL via RESPIRATORY_TRACT
  Filled 2015-04-18: qty 9

## 2015-04-18 MED ORDER — ONDANSETRON HCL 4 MG/2ML IJ SOLN: 4.0000 mg | Freq: Four times a day (QID) | INTRAMUSCULAR | Status: DC | PRN

## 2015-04-18 MED ORDER — IPRATROPIUM-ALBUTEROL 0.5-2.5 (3) MG/3ML IN SOLN
3.0000 mL | Freq: Four times a day (QID) | RESPIRATORY_TRACT | Status: DC
  Administered 2015-04-18 – 2015-04-21 (×12): 3 mL via RESPIRATORY_TRACT
  Filled 2015-04-18 (×12): qty 3

## 2015-04-18 MED ORDER — METHYLPREDNISOLONE SODIUM SUCC 125 MG IJ SOLR
60.0000 mg | Freq: Every day | INTRAMUSCULAR | Status: DC
  Administered 2015-04-18 – 2015-04-21 (×4): 60 mg via INTRAVENOUS
  Filled 2015-04-18 (×5): qty 2

## 2015-04-18 MED ORDER — MOMETASONE FURO-FORMOTEROL FUM 100-5 MCG/ACT IN AERO
2.0000 | INHALATION_SPRAY | Freq: Two times a day (BID) | RESPIRATORY_TRACT | Status: DC
Start: 2015-04-18 — End: 2015-04-18
  Filled 2015-04-18: qty 8.8

## 2015-04-18 MED ORDER — BUDESONIDE 0.25 MG/2ML IN SUSP
0.2500 mg | Freq: Four times a day (QID) | RESPIRATORY_TRACT | Status: DC
  Filled 2015-04-18 (×7): qty 2

## 2015-04-18 MED ORDER — ONDANSETRON HCL 4 MG PO TABS
4.0000 mg | ORAL_TABLET | Freq: Four times a day (QID) | ORAL | Status: DC | PRN
Start: 2015-04-18 — End: 2015-04-21

## 2015-04-18 MED ORDER — SODIUM CHLORIDE 0.9 % IJ SOLN
3.0000 mL | Freq: Two times a day (BID) | INTRAMUSCULAR | Status: DC
  Administered 2015-04-18 – 2015-04-21 (×7): 3 mL via INTRAVENOUS

## 2015-04-18 MED ORDER — DOCUSATE SODIUM 100 MG PO CAPS
100.0000 mg | ORAL_CAPSULE | Freq: Two times a day (BID) | ORAL | Status: DC
Start: 2015-04-18 — End: 2015-04-21
  Administered 2015-04-19 – 2015-04-20 (×4): 100 mg via ORAL
  Filled 2015-04-18 (×5): qty 1

## 2015-04-18 MED ORDER — ACETAMINOPHEN 650 MG RE SUPP: 650.0000 mg | Freq: Four times a day (QID) | RECTAL | Status: DC | PRN

## 2015-04-18 MED ORDER — NICOTINE 21 MG/24HR TD PT24
21.0000 mg | MEDICATED_PATCH | Freq: Every day | TRANSDERMAL | Status: DC
  Administered 2015-04-18 – 2015-04-21 (×4): 21 mg via TRANSDERMAL
  Filled 2015-04-18 (×4): qty 1

## 2015-04-18 MED ORDER — SERTRALINE HCL 50 MG PO TABS
150.0000 mg | ORAL_TABLET | Freq: Every day | ORAL | Status: DC
  Administered 2015-04-19 – 2015-04-21 (×3): 150 mg via ORAL
  Filled 2015-04-18 (×2): qty 1
  Filled 2015-04-18: qty 3

## 2015-04-18 MED ORDER — BISOPROLOL-HYDROCHLOROTHIAZIDE 10-6.25 MG PO TABS
1.0000 | ORAL_TABLET | Freq: Every day | ORAL | Status: DC
  Administered 2015-04-19 – 2015-04-21 (×3): 1 via ORAL
  Filled 2015-04-18 (×4): qty 1

## 2015-04-18 MED ORDER — DILTIAZEM HCL 60 MG PO TABS
120.0000 mg | ORAL_TABLET | Freq: Every day | ORAL | Status: DC
  Administered 2015-04-19 – 2015-04-20 (×2): 120 mg via ORAL
  Filled 2015-04-18 (×3): qty 2

## 2015-04-18 MED ORDER — ACETAMINOPHEN 325 MG PO TABS
650.0000 mg | ORAL_TABLET | Freq: Four times a day (QID) | ORAL | Status: DC | PRN
  Administered 2015-04-19 – 2015-04-20 (×2): 650 mg via ORAL
  Filled 2015-04-18 (×3): qty 2

## 2015-04-18 MED ORDER — TIOTROPIUM BROMIDE MONOHYDRATE 18 MCG IN CAPS
18.0000 ug | ORAL_CAPSULE | Freq: Every day | RESPIRATORY_TRACT | Status: DC
  Filled 2015-04-18: qty 5

## 2015-04-18 NOTE — ED Notes (Signed)
Report received from [place], rn. Pt returned from ct scan.

## 2015-04-18 NOTE — ED Notes (Addendum)
Pt's daughter states that pt was unconscious for approximately 5 minutes and that she and her friends shook her for a while to try and wake her up. Denies hitting head. Pt somnolent during assessment but awakens fairly easily when prompted.

## 2015-04-18 NOTE — Progress Notes (Signed)
Brief Note (full consult note to follow)  59 yo female with COPD (StageII, followed by Dr. Raul Del), HTN, CAD, Tobacco abuse, admitted after increase somnolence and lethargy, found to be in resp acidosis (on VBG).  Initially, admitted to the floor, but had worsening respiratory status, and transferred to the ICU  A\P 59 yo Female with acute on chronic respiratory distress and AECOPD  Acute on Chronic respiratory distress with respiratory acidosis - secondary to COPD and continued smoking - continue BiPAP and supply supplemental O2 as needed.  Will require Bipap at night (min 4 hrs) - abg prn - scheduled duonebs q6hrs, q2hrs prn - pulmicort nebs - stop Spiriva and dulera while in the ICU - steroids - solumedrol 64m IV q6hrs x 1 day, then transition to prednisone [id number] - maintain O2 sats>88% - limit use of narcotics and benzos  COPD - stage II, followed by Dr. FlRaul Delutpatient  - management as stated above - resume outpatient meds upon discharge (Breo, Spiriva, Daliresp, and PRN albuterol)   Tobacco abuse - cont with NicoDerm patch  Hypertension - Continue diltiazem and bisoprolol with hydrochlorothiazide - maintain systolic <1<[id number] BoehringerMD [facility] Pager - [phone] enter [phone])

## 2015-04-18 NOTE — H&P (Signed)
Theresa [name] is an 59 y.o. female.   Chief Complaint: Somnolence HPI: The patient presents emergency department via EMS after collapsing outside her home today. The patient had been sitting outside and appeared weaker and more somnolent despite increasing her oxygen via nasal cannula that she wears 24/7. She started to walk toward the house to sit in the cooler air when she collapsed. She remained conscious but required the help of 3 people to walk her into the home where she sat in a chair and immediately fell asleep. The daughter reports that she could not awaken the patient for at least 5 minutes. By the time she did wake up she was very confused. The patient's daughter states that she has been falling asleep easily for the last week. Due to her increased weakness and somnolence her daughter has increased her home O2 via nasal cannula to 4 L/m area in the emergency department the patient was found to have a respiratory acidosis and continued somnolence which prompted the emergency physician to call the hospitalist service for admission.  Past Medical History  Diagnosis Date  . COPD (chronic obstructive pulmonary disease)   . Hypertension   . Gastritis   . Lumbar radiculitis   . Muscle weakness   . DDD (degenerative disc disease), cervical   . Depression   . Chronic rhinitis   . Chronic generalized abdominal pain   . Anxiety   . Edema   . Carpal tunnel syndrome   . Low back pain   . Hematuria   . Malodorous urine   . Sensory urge incontinence   . Obesity   . Renal cyst   . Tobacco abuse     Past Surgical History  Procedure Laterality Date  . Tubal ligation    . Abdominal hysterectomy    . Tonsillectomy      Family History  Problem Relation Age of Onset  . Kidney disease Mother   . Prostate cancer Neg Hx   . Kidney cancer Brother   . Lung cancer Sister   . Coronary artery disease Mother    Social History:  reports that she has been smoking Cigarettes.  She has a 80  pack-year smoking history. She does not have any smokeless tobacco history on file. She reports that she does not drink alcohol or use illicit drugs.  Allergies:  Allergies  Allergen Reactions  . Codeine Nausea And Vomiting    Prior to Admission medications   Medication Sig Start Date End Date Taking? Authorizing Provider  bisoprolol-hydrochlorothiazide (ZIAC) 10-6.25 MG per tablet Take 1 tablet by mouth daily.   Yes Historical Provider, MD  diltiazem (CARDIZEM) 120 MG tablet Take 120 mg by mouth daily.   Yes Historical Provider, MD  estradiol (ESTRACE) 1 MG tablet Take 1 mg by mouth daily.   Yes Historical Provider, MD  fesoterodine (TOVIAZ) 8 MG TB24 tablet Take 8 mg by mouth daily.   Yes Historical Provider, MD  Fluticasone-Salmeterol (ADVAIR) 250-50 MCG/DOSE AEPB Inhale 1 puff into the lungs every 12 (twelve) hours.   Yes Historical Provider, MD  roflumilast (DALIRESP) 500 MCG TABS tablet Take 500 mcg by mouth daily.   Yes Historical Provider, MD  sertraline (ZOLOFT) 100 MG tablet Take 150 mg by mouth daily.    Yes Historical Provider, MD  sucralfate (CARAFATE) 1 G tablet Take 1 g by mouth 2 (two) times daily.   Yes Historical Provider, MD  tiotropium (SPIRIVA) 18 MCG inhalation capsule Place 18 mcg into inhaler and inhale  daily.   Yes Historical Provider, MD  albuterol (PROVENTIL HFA;VENTOLIN HFA) 108 (90 BASE) MCG/ACT inhaler Inhale 2 puffs into the lungs every 6 (six) hours as needed for wheezing.    Historical Provider, MD  Aspirin-Caffeine (BC FAST PAIN RELIEF ARTHRITIS) 1000-65 MG PACK Take 1 packet by mouth every 6 (six) hours as needed (Pain).    Historical Provider, MD  Adair Patter 100-25 MCG/INH AEPB  01/09/15   Historical Provider, MD  chlorpheniramine-HYDROcodone (TUSSIONEX PENNKINETIC ER) 10-8 MG/5ML SUER Take 5 mLs by mouth every 12 (twelve) hours as needed for cough. 03/01/15   Frederich Cha, MD  levofloxacin (LEVAQUIN) 500 MG tablet Take 1 tablet (500 mg total) by mouth daily.  03/01/15   Frederich Cha, MD  mirabegron ER (MYRBETRIQ) 50 MG TB24 tablet Take 50 mg by mouth daily. 10/14/14   Historical Provider, MD  predniSONE (DELTASONE) 20 MG tablet 2 tabs po daily x 4 days 10/17/13   Elnora Morrison, MD  predniSONE (STERAPRED UNI-PAK 21 TAB) 10 MG (21) TBPK tablet All pills to be taken orally. 6 tablets day 1, 5 tablets daily 2, 4 tablets day 3, 3 tablets day 4, 2 tablets day 5, 1 tablet day 6 day 03/01/15   Frederich Cha, MD     Results for orders placed or performed during the hospital encounter of 04/18/15 (from the past 48 hour(s))  CBC     Status: Abnormal   Collection Time: 04/18/15  2:25 AM  Result Value Ref Range   WBC 11.0 3.6 - 11.0 K/uL   RBC 4.95 3.80 - 5.20 MIL/uL   Hemoglobin 16.1 (H) 12.0 - 16.0 g/dL   HCT 49.9 (H) 35.0 - 47.0 %   MCV 100.8 (H) 80.0 - 100.0 fL   MCH 32.5 26.0 - 34.0 pg   MCHC 32.2 32.0 - 36.0 g/dL   RDW 15.7 (H) 11.5 - 14.5 %   Platelets 153 150 - 440 K/uL  Comprehensive metabolic panel     Status: Abnormal   Collection Time: 04/18/15  2:25 AM  Result Value Ref Range   Sodium 145 135 - 145 mmol/L   Potassium 3.9 3.5 - 5.1 mmol/L   Chloride 103 101 - 111 mmol/L   CO2 33 (H) 22 - 32 mmol/L   Glucose, Bld 121 (H) 65 - 99 mg/dL   BUN 23 (H) 6 - 20 mg/dL   Creatinine, Ser 0.79 0.44 - 1.00 mg/dL   Calcium 9.1 8.9 - 10.3 mg/dL   Total Protein 6.7 6.5 - 8.1 g/dL   Albumin 3.7 3.5 - 5.0 g/dL   AST 19 15 - 41 U/L   ALT 17 14 - 54 U/L   Alkaline Phosphatase 72 38 - 126 U/L   Total Bilirubin 0.3 0.3 - 1.2 mg/dL   GFR calc non Af Amer >60 >60 mL/min   GFR calc Af Amer >60 >60 mL/min    Comment: (NOTE) The eGFR has been calculated using the CKD EPI equation. This calculation has not been validated in all clinical situations. eGFR's persistently <60 mL/min signify possible Chronic Kidney Disease.    Anion gap 9 5 - 15  Troponin I     Status: None   Collection Time: 04/18/15  2:25 AM  Result Value Ref Range   Troponin I <0.03 <0.031  ng/mL    Comment:        NO INDICATION OF MYOCARDIAL INJURY.   Blood gas, arterial ([facility] & [facility] ONLY)     Status: Abnormal  Collection Time: 04/18/15  2:50 AM  Result Value Ref Range   FIO2 0.36 %   Delivery systems NASAL CANNULA    pH, Arterial 7.30 (L) 7.350 - 7.450   pCO2 arterial 70 (HH) 32.0 - 48.0 mmHg   pO2, Arterial 61 (L) 83.0 - 108.0 mmHg   Bicarbonate 34.4 (H) 21.0 - 28.0 mEq/L   Acid-Base Excess 5.6 (H) 0.0 - 3.0 mmol/L   O2 Saturation 88.2 %   Patient temperature 37.0    Collection site RIGHT RADIAL    Sample type ARTERIAL DRAW    Allens test (pass/fail) PASS PASS   Ct Head Wo Contrast  04/18/2015   CLINICAL DATA:  Initial evaluation for syncope.  EXAM: CT HEAD WITHOUT CONTRAST  TECHNIQUE: Contiguous axial images were obtained from the base of the skull through the vertex without intravenous contrast.  COMPARISON:  Prior CT from 12/31/2013  FINDINGS: There is no acute intracranial hemorrhage or infarct. No mass lesion or midline shift. Gray-white matter differentiation is well maintained. Ventricles are normal in size without evidence of hydrocephalus. CSF containing spaces are within normal limits. No extra-axial fluid collection. Mild chronic small vessel ischemic changes present within the periventricular white matter. Scattered vascular calcifications present within the carotid siphons.  The calvarium is intact.  Orbital soft tissues are within normal limits.  The paranasal sinuses and mastoid air cells are well pneumatized and free of fluid.  Scalp soft tissues are unremarkable.  IMPRESSION: 1. No acute intracranial process. 2. Mild chronic small vessel ischemic disease.   Electronically Signed   By: Jeannine Boga M.D.   On: 04/18/2015 03:23   Dg Chest Portable 1 View  04/18/2015   CLINICAL DATA:  Syncope. Headache and leg weakness. Neck pain. Initial encounter.  EXAM: PORTABLE CHEST - 1 VIEW  COMPARISON:  Chest radiograph performed 03/01/2015  FINDINGS: Mild vascular  congestion is noted. Mild bibasilar atelectasis is seen. No pleural effusion or pneumothorax is identified.  The cardiomediastinal silhouette is borderline normal in size. No acute osseous abnormalities are identified.  IMPRESSION: Mild vascular congestion noted.  Mild bibasilar atelectasis seen.   Electronically Signed   By: Garald Balding M.D.   On: 04/18/2015 02:32    Review of Systems  Unable to perform ROS: mental status change   the patient is so somnolent that she falls asleep mid-sentence  Blood pressure 160/94, pulse 83, temperature 98.6 F (37 C), temperature source Oral, resp. rate 17, height _0  (1.6 m), weight 106.595 kg (235 lb), SpO2 94 %. Physical Exam  Vitals reviewed. Constitutional: She is oriented to person, place, and time. She appears well-developed and well-nourished.  HENT:  Head: Normocephalic and atraumatic.  Mouth/Throat: Oropharynx is clear and moist.  Eyes: Conjunctivae and EOM are normal. Pupils are equal, round, and reactive to light. No scleral icterus.  Neck: Normal range of motion. Neck supple. No JVD present. No tracheal deviation present. No thyromegaly present.  Cardiovascular: Normal rate, regular rhythm, normal heart sounds and intact distal pulses.  Exam reveals no gallop and no friction rub.   No murmur heard. Respiratory: Effort normal. She has wheezes.  Prolonged expiratory wheezes  GI: Soft. Bowel sounds are normal. She exhibits no distension. There is no tenderness.  Genitourinary:  Deferred  Musculoskeletal: Normal range of motion. She exhibits edema (trace).  Lymphadenopathy:    She has no cervical adenopathy.  Neurological: She is alert and oriented to person, place, and time. No cranial nerve deficit. She exhibits normal muscle tone.  Skin: Skin is warm and dry.  Psychiatric: She has a normal mood and affect. Judgment and thought content normal.  (incomplete assessment due to somnolence)     Assessment/Plan This is a 59 year old  Caucasian female admitted for chronic respiratory acidosis and somnolence. 1. Chronic respiratory acidosis: Secondary to COPD and continued smoking. The patient has partial metabolic compensation. We will place her on BiPAP and supply supplemental O2 as needed. 2. COPD: Continue inhaled corticosteroid and Spiriva 3. Tobacco abuse: Apply NicoDerm patch 4. Hypertension: Continue diltiazem and bisoprolol with hydrochlorothiazide 5. CHF: The patient has trace edema of her lower extremities.Echo results are not available at this time. (Possible side effect of diltiazem?) 6. Coronary artery disease: Stable 7. Depression: Continue Zoloft 8. DVT prophylaxis: Heparin 9. GI prophylaxis: Sucralfate (history of gastritis likely secondary to steroid use) The patient is a full code. Time spent on admission orders and patient care approximately 35 minutes  Harrie Foreman 04/18/2015, 5:14 AM

## 2015-04-18 NOTE — Plan of Care (Signed)
Problem: Discharge Progression Outcomes Goal: Hemodynamically stable Outcome: Completed/Met Date Met:  04/18/15 Pt lethargic but arousable throughout shift, lethargy improved after bipap placement, pt awake long enough to eat, incontinent of urine, no bm throughout shift, denies pain, unable to give po medications in am due to lethargy, vital signs remain stable. Update given to daughter via telephone. Respiratory monitoring blood gasses. WBC remain stable.

## 2015-04-18 NOTE — Progress Notes (Addendum)
Pt transferred to CCU, report given to sandra, bipap in place. CCU RN to call Sudini to have him call family and update them on status

## 2015-04-18 NOTE — ED Provider Notes (Signed)
[facility] Emergency Department Provider Note  ____________________________________________  Time seen: 2:00 AM  I have reviewed the triage vital signs and the nursing notes.   HISTORY  Chief Complaint Loss of Consciousness      HPI Theresa [name] is a 59 y.o. female presents with multiple episodes of syncope for the past few days per EMS. Of note tonight the patient's daughter stated that the patient would abruptly, unconscious for as long as 5 minutes. Patient denies any trauma to her head no falls.She denies any complaints at present     Past Medical History  Diagnosis Date  . COPD (chronic obstructive pulmonary disease)   . Hypertension   . Gastritis   . Lumbar radiculitis   . Muscle weakness   . DDD (degenerative disc disease), cervical   . Depression   . Chronic rhinitis   . Chronic generalized abdominal pain   . Anxiety   . Edema   . Carpal tunnel syndrome   . Low back pain   . Hematuria   . Malodorous urine   . Sensory urge incontinence   . Obesity   . Renal cyst   . Tobacco abuse     Patient Active Problem List   Diagnosis Date Noted  . Chronic respiratory acidosis 04/18/2015    Past Surgical History  Procedure Laterality Date  . Tubal ligation    . Abdominal hysterectomy    . Tonsillectomy      Current Outpatient Rx  Name  Route  Sig  Dispense  Refill  . bisoprolol-hydrochlorothiazide (ZIAC) 10-6.25 MG per tablet   Oral   Take 1 tablet by mouth daily.         Marland Kitchen diltiazem (CARDIZEM) 120 MG tablet   Oral   Take 120 mg by mouth daily.         Marland Kitchen estradiol (ESTRACE) 1 MG tablet   Oral   Take 1 mg by mouth daily.         . fesoterodine (TOVIAZ) 8 MG TB24 tablet   Oral   Take 8 mg by mouth daily.         . Fluticasone-Salmeterol (ADVAIR) 250-50 MCG/DOSE AEPB   Inhalation   Inhale 1 puff into the lungs every 12 (twelve) hours.         . roflumilast (DALIRESP) 500 MCG TABS tablet   Oral   Take 500  mcg by mouth daily.         . sertraline (ZOLOFT) 100 MG tablet   Oral   Take 150 mg by mouth daily.          . sucralfate (CARAFATE) 1 G tablet   Oral   Take 1 g by mouth 2 (two) times daily.         Marland Kitchen tiotropium (SPIRIVA) 18 MCG inhalation capsule   Inhalation   Place 18 mcg into inhaler and inhale daily.         Marland Kitchen albuterol (PROVENTIL HFA;VENTOLIN HFA) 108 (90 BASE) MCG/ACT inhaler   Inhalation   Inhale 2 puffs into the lungs every 6 (six) hours as needed for wheezing.         . Aspirin-Caffeine (BC FAST PAIN RELIEF ARTHRITIS) 1000-65 MG PACK   Oral   Take 1 packet by mouth every 6 (six) hours as needed (Pain).         Marland Kitchen BREO ELLIPTA 100-25 MCG/INH AEPB  Dispense as written.   . chlorpheniramine-HYDROcodone (TUSSIONEX PENNKINETIC ER) 10-8 MG/5ML SUER   Oral   Take 5 mLs by mouth every 12 (twelve) hours as needed for cough.   115 mL   0   . levofloxacin (LEVAQUIN) 500 MG tablet   Oral   Take 1 tablet (500 mg total) by mouth daily.   10 tablet   0   . mirabegron ER (MYRBETRIQ) 50 MG TB24 tablet   Oral   Take 50 mg by mouth daily.         . predniSONE (DELTASONE) 20 MG tablet      2 tabs po daily x 4 days   8 tablet   0   . predniSONE (STERAPRED UNI-PAK 21 TAB) 10 MG (21) TBPK tablet      All pills to be taken orally. 6 tablets day 1, 5 tablets daily 2, 4 tablets day 3, 3 tablets day 4, 2 tablets day 5, 1 tablet day 6 day   21 tablet   0     Allergies Codeine  Family History  Problem Relation Age of Onset  . Kidney disease Mother   . Prostate cancer Neg Hx   . Kidney cancer Brother   . Lung cancer Sister   . Coronary artery disease Mother     Social History History  Substance Use Topics  . Smoking status: Current Every Day Smoker -- 2.00 packs/day for 40 years    Types: Cigarettes  . Smokeless tobacco: Not on file     Comment: 30+ years  . Alcohol Use: No    Review of Systems  Constitutional: Negative for  fever. Eyes: Negative for visual changes. ENT: Negative for sore throat. Cardiovascular: Negative for chest pain. Respiratory: Negative for shortness of breath. Gastrointestinal: Negative for abdominal pain, vomiting and diarrhea. Genitourinary: Negative for dysuria. Musculoskeletal: Negative for back pain. Skin: Negative for rash. Neurological: Negative for headaches, focal weakness or numbness. Positive for Syncope   10-point ROS otherwise negative.  ____________________________________________   PHYSICAL EXAM:  VITAL SIGNS: ED Triage Vitals  Enc Vitals Group     BP 04/18/15 0202 120/74 mmHg     Pulse Rate 04/18/15 0202 88     Resp 04/18/15 0202 26     Temp 04/18/15 0202 98.6 F (37 C)     Temp Source 04/18/15 0202 Oral     SpO2 04/18/15 0150 95 %     Weight 04/18/15 0202 235 lb (106.595 kg)     Height 04/18/15 0202 _0  (1.6 m)     Head Cir --      Peak Flow --      Pain Score 04/18/15 0205 7     Pain Loc --      Pain Edu? --      Excl. in [place]? --      Constitutional: Patient will abruptly fall asleep during urination evaluation. Eyes: Conjunctivae are normal. PERRL. Normal extraocular movements. ENT   Head: Normocephalic and atraumatic.   Nose: No congestion/rhinnorhea.   Mouth/Throat: Mucous membranes are moist.   Neck: No stridor. Cardiovascular: Normal rate, regular rhythm. Normal and symmetric distal pulses are present in all extremities. No murmurs, rubs, or gallops. Respiratory: Normal respiratory effort without tachypnea nor retractions. Breath sounds are clear and equal bilaterally. No wheezes/rales/rhonchi. Gastrointestinal: Soft and nontender. No distention. There is no CVA tenderness. Genitourinary: deferred Musculoskeletal: Nontender with normal range of motion in all extremities. No joint effusions.  No lower extremity tenderness nor  edema. Neurologic:  Normal speech and language. No gross focal neurologic deficits are appreciated.  Speech is normal.  Skin:  Skin is warm, dry and intact. No rash noted. Psychiatric: Mood and affect are normal. Speech and behavior are normal. Patient exhibits appropriate insight and judgment.  ____________________________________________    LABS (pertinent positives/negatives)  Labs Reviewed  CBC - Abnormal; Notable for the following:    Hemoglobin 16.1 (*)    HCT 49.9 (*)    MCV 100.8 (*)    RDW 15.7 (*)    All other components within normal limits  COMPREHENSIVE METABOLIC PANEL - Abnormal; Notable for the following:    CO2 33 (*)    Glucose, Bld 121 (*)    BUN 23 (*)    All other components within normal limits  BLOOD GAS, ARTERIAL - Abnormal; Notable for the following:    pH, Arterial 7.30 (*)    pCO2 arterial 70 (*)    pO2, Arterial 61 (*)    Bicarbonate 34.4 (*)    Acid-Base Excess 5.6 (*)    All other components within normal limits  TROPONIN I     ____________________________________________   EKG Interpreted by me and Dr. Marjean Donna   Date: 04/18/2015  Rate: 88  Rhythm: normal sinus rhythm  QRS Axis: normal  Intervals: normal  ST/T Wave abnormalities: normal  Conduction Disutrbances: none  Narrative Interpretation: unremarkable      ____________________________________________    RADIOLOGY  CT scan of the head revealed  IMPRESSION: 1. No acute intracranial process. 2. Mild chronic small vessel ischemic disease.    INITIAL IMPRESSION / ASSESSMENT AND PLAN / ED COURSE  Pertinent labs & imaging results that were available during my care of the patient were reviewed by me and considered in my medical decision making (see chart for details).  History of physical exam concerning for COPD exacerbation with an elevated carbon dioxide level which may be is in the patient's somnolence. As such patient was admitted to Dr. Marcille Blanco  ____________________________________________   FINAL CLINICAL IMPRESSION(S) / ED DIAGNOSES  Final diagnoses:   Chronic obstructive pulmonary disease with acute exacerbation      Gregor Hams, MD 04/18/15 [phone]

## 2015-04-18 NOTE — Consult Note (Addendum)
PULMONARY / CRITICAL CARE MEDICINE   Name: Theresa [name] MRN: [id number] DOB: 02/06/1956    ADMISSION DATE:  04/18/2015 CONSULTATION DATE: 04/19/15  REFERRING MD :  Dr. Darvin Neighbours   CHIEF COMPLAINT:    somnolence and lethargy   HISTORY OF PRESENT ILLNESS   History per records.  Patient presents emergency department via EMS after collapsing outside her home, she appeared weaker and more somnolent despite increasing her oxygen via nasal cannula that she wears 24/7. She started to walk toward the house to sit in the cooler air when she collapsed. She remained conscious but required the help of 3 people to walk her into the home where she sat in a chair and immediately fell asleep. Per report, daughter states that she could not awaken the patient for at least 5 minutes. By the time she did wake up she was very confused. The patient's daughter states that she has been falling asleep easily for the last week. Due to her increased weakness and somnolence her daughter has increased her home O2 via nasal cannula to 4 L/min. ED abg showed respiratory acidosis, patient was admitted to the floor, but noted to have worsening respiratory status, and was transferred to the ICU. Patient follows with Dr. Raul Del, last seen at his clinic on 01/2015  Outpatient clinic note 02/11/15: Theresa [name] is a 59 y.o. female presents to clinic for recheck. She has stage II copd, still smoking (less), over weight, on portable oxygen. Struggling to loose weight. Her breathing is no worse. No wheezing, occasional cough, no ectopy, syncope, angina, calf pain or swelling. She is not having chest pain, there is mild edema. No calf pain.   Current Medications:  Current Outpatient Prescriptions  Medication Sig Dispense Refill  . albuterol (VENTOLIN HFA) 90 mcg/actuation inhaler 2 puff by inhalation 4 times a day as needed  . aspirin 81 MG chewable tablet Take 81 mg by mouth once daily.  Marland Kitchen aspirin-caffeine (BC ARTHRITIS)  1,000-65 mg PwPk Take 1 Package by mouth once daily as needed.  . bisoprolol-hydrochlorothiazide (ZIAC) 10-6.25 mg tablet 1 tab by mouth daily  . estradiol (ESTRACE) 1 MG tablet Take 1 tablet (1 mg total) by mouth once daily. 30 tablet 11  . fluticasone-vilanterol (BREO ELLIPTA) 100-25 mcg/dose DsDv inhaler Inhale 1 inhalation into the lungs once daily. 1 Inhaler 6  . omeprazole (PRILOSEC) 40 MG DR capsule TAKE 1 CAPSULE (40 MG TOTAL) BY MOUTH ONCE DAILY. TAKE 15-20 MINS BEFORE MEALS 30 capsule 3  . omeprazole (PRILOSEC) 40 MG DR capsule TAKE 1 CAPSULE (40 MG TOTAL) BY MOUTH ONCE DAILY. TAKE 15-20 MINS BEFORE MEALS 30 capsule 3  . OXYGEN-AIR DELIVERY SYSTEMS MISC Use once daily.  . roflumilast (DALIRESP) 500 mcg tablet Take 1 tablet (0.5 mg total) by mouth once daily. 30 tablet 12  . sertraline (ZOLOFT) 100 MG tablet Take 1.5 tablets (150 mg total) by mouth once daily. 30 tablet 5  . sucralfate (CARAFATE) 1 gram tablet Take 1 tablet (1 g total) by mouth 2 (two) times daily before meals. 60 tablet 3  . tiotropium (SPIRIVA WITH HANDIHALER) 18 mcg inhalation capsule Place 1 capsule (18 mcg total) into inhaler and inhale once daily. 30 capsule 12  Impression: .  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  Nicotine addiction    .  Obesity struggling to loose weight   .  Shortness of breath, no worse    .  Elevated right ventricular end-diastolic pressure   On  3 liters o2, on inogen system   Plan: -Loose weight -Stay on same regimen (to use all pulmonary meds as scheduled) albuterol refilled -Avoid cigarettes -patches, nicorrete etc -follow up in 4 months     PAST MEDICAL HISTORY    :  Past Medical History  Diagnosis Date  . COPD (chronic obstructive pulmonary disease)   . Hypertension   . Gastritis   . Lumbar radiculitis   . Muscle weakness   . DDD (degenerative disc disease), cervical   . Depression   . Chronic rhinitis   . Chronic generalized  abdominal pain   . Anxiety   . Edema   . Carpal tunnel syndrome   . Low back pain   . Hematuria   . Malodorous urine   . Sensory urge incontinence   . Obesity   . Renal cyst   . Tobacco abuse    Past Surgical History  Procedure Laterality Date  . Tubal ligation    . Abdominal hysterectomy    . Tonsillectomy     Prior to Admission medications   Medication Sig Start Date End Date Taking? Authorizing Provider  bisoprolol-hydrochlorothiazide (ZIAC) 10-6.25 MG per tablet Take 1 tablet by mouth daily.   Yes Historical Provider, MD  diltiazem (CARDIZEM) 120 MG tablet Take 120 mg by mouth daily.   Yes Historical Provider, MD  estradiol (ESTRACE) 1 MG tablet Take 1 mg by mouth daily.   Yes Historical Provider, MD  fesoterodine (TOVIAZ) 8 MG TB24 tablet Take 8 mg by mouth daily.   Yes Historical Provider, MD  Fluticasone-Salmeterol (ADVAIR) 250-50 MCG/DOSE AEPB Inhale 1 puff into the lungs every 12 (twelve) hours.   Yes Historical Provider, MD  roflumilast (DALIRESP) 500 MCG TABS tablet Take 500 mcg by mouth daily.   Yes Historical Provider, MD  sertraline (ZOLOFT) 100 MG tablet Take 150 mg by mouth daily.    Yes Historical Provider, MD  sucralfate (CARAFATE) 1 G tablet Take 1 g by mouth 2 (two) times daily.   Yes Historical Provider, MD  tiotropium (SPIRIVA) 18 MCG inhalation capsule Place 18 mcg into inhaler and inhale daily.   Yes Historical Provider, MD  albuterol (PROVENTIL HFA;VENTOLIN HFA) 108 (90 BASE) MCG/ACT inhaler Inhale 2 puffs into the lungs every 6 (six) hours as needed for wheezing.    Historical Provider, MD  Aspirin-Caffeine (BC FAST PAIN RELIEF ARTHRITIS) 1000-65 MG PACK Take 1 packet by mouth every 6 (six) hours as needed (Pain).    Historical Provider, MD  Adair Patter 100-25 MCG/INH AEPB  01/09/15   Historical Provider, MD  chlorpheniramine-HYDROcodone (TUSSIONEX PENNKINETIC ER) 10-8 MG/5ML SUER Take 5 mLs by mouth every 12 (twelve) hours as needed for cough. 03/01/15    Frederich Cha, MD  levofloxacin (LEVAQUIN) 500 MG tablet Take 1 tablet (500 mg total) by mouth daily. 03/01/15   Frederich Cha, MD  mirabegron ER (MYRBETRIQ) 50 MG TB24 tablet Take 50 mg by mouth daily. 10/14/14   Historical Provider, MD  predniSONE (DELTASONE) 20 MG tablet 2 tabs po daily x 4 days 10/17/13   Elnora Morrison, MD  predniSONE (STERAPRED UNI-PAK 21 TAB) 10 MG (21) TBPK tablet All pills to be taken orally. 6 tablets day 1, 5 tablets daily 2, 4 tablets day 3, 3 tablets day 4, 2 tablets day 5, 1 tablet day 6 day 03/01/15   Frederich Cha, MD   Allergies  Allergen Reactions  . Codeine Nausea And Vomiting     FAMILY HISTORY   Family History  Problem Relation Age of Onset  . Kidney disease Mother   . Prostate cancer Neg Hx   . Kidney cancer Brother   . Lung cancer Sister   . Coronary artery disease Mother       SOCIAL HISTORY    reports that she has been smoking Cigarettes.  She has a 80 pack-year smoking history. She does not have any smokeless tobacco history on file. She reports that she does not drink alcohol or use illicit drugs.  ROS Constitutional: Positive for malaise/fatigue. Negative for fever and chills.  HENT: Negative for sore throat.  Eyes: Negative for blurred vision, double vision and pain.  Respiratory: Positive for cough, shortness of breath and wheezing. Negative for hemoptysis.  Cardiovascular: Positive for chest pain (lower chest and upper abd pain on coughing). Negative for palpitations, orthopnea and leg swelling.  Gastrointestinal: Negative for heartburn, nausea, vomiting, abdominal pain, diarrhea and constipation.  Genitourinary: Negative for dysuria and hematuria.  Musculoskeletal: Negative for back pain and joint pain.  Skin: Negative for rash.  Neurological: Positive for weakness. Negative for sensory change, speech change, focal weakness and headaches.  Endo/Heme/Allergies: Does not bruise/bleed easily.  Psychiatric/Behavioral: Negative for  depression. The patient is nervous/anxious.    VITAL SIGNS    Temp:  [98.5 F (36.9 C)-98.7 F (37.1 C)] 98.5 F (36.9 C) (07/02 1800) Pulse Rate:  [66-88] 68 (07/02 1800) Resp:  [15-26] 16 (07/02 1800) BP: (95-160)/(58-99) 117/74 mmHg (07/02 1800) SpO2:  [92 %-98 %] 93 % (07/02 1800) Weight:  [235 lb (106.595 kg)-239 lb (108.41 kg)] 239 lb (108.41 kg) (07/02 0609) HEMODYNAMICS:   VENTILATOR SETTINGS:   INTAKE / OUTPUT:  Intake/Output Summary (Last 24 hours) at 04/18/15 1816 Last data filed at 04/18/15 1200  Gross per 24 hour  Intake      0 ml  Output      0 ml  Net      0 ml       PHYSICAL EXAM   Physical Exam  GENERAL: NAD. EYES: Pupils equal, round, reactive to light and accommodation. No scleral icterus. Extraocular muscles intact.  HEENT: Head atraumatic, normocephalic. Oropharynx and nasopharynx clear.  NECK: Supple, no jugular venous distention. No thyroid enlargement, no tenderness.  LUNGS: moderate wheezing diffusely, good respiratory effort.  CARDIOVASCULAR: S1, S2 normal. No murmurs, rubs, or gallops.  ABDOMEN: Soft, nontender, nondistended. Bowel sounds present. No organomegaly or mass.  EXTREMITIES: No cyanosis, no clubbing.  Trace pedal and b\l leg edema noted.  NEUROLOGIC: Cranial nerves II through XII are intact. No focal Motor or sensory deficits b/l.  PSYCHIATRIC: The patient is awake SKIN: No obvious rash, lesion, or ulcer.    LABS   LABS:  CBC  Recent Labs Lab 04/18/15 0225  WBC 11.0  HGB 16.1*  HCT 49.9*  PLT 153   Coag's No results for input(s): APTT, INR in the last 168 hours. BMET  Recent Labs Lab 04/18/15 0225  NA 145  K 3.9  CL 103  CO2 33*  BUN 23*  CREATININE 0.79  GLUCOSE 121*   Electrolytes  Recent Labs Lab 04/18/15 0225  CALCIUM 9.1   Sepsis Markers No results for input(s): LATICACIDVEN, PROCALCITON, O2SATVEN in the last 168 hours. ABG  Recent Labs Lab 04/18/15 0250  PHART 7.30*   PCO2ART 70*  PO2ART 61*   Liver Enzymes  Recent Labs Lab 04/18/15 0225  AST 19  ALT 17  ALKPHOS 72  BILITOT 0.3  ALBUMIN 3.7   Cardiac Enzymes  Recent Labs Lab 04/18/15 0225  TROPONINI <0.03   Glucose  Recent Labs Lab 04/18/15 1744  GLUCAP 102*     No results found for this or any previous visit (from the past 240 hour(s)).   Current facility-administered medications:  .  acetaminophen (TYLENOL) tablet 650 mg, 650 mg, Oral, Q6H PRN **OR** acetaminophen (TYLENOL) suppository 650 mg, 650 mg, Rectal, Q6H PRN, Harrie Foreman, MD .  bisoprolol-hydrochlorothiazide [facility]) 10-6.25 MG per tablet 1 tablet, 1 tablet, Oral, Daily, Harrie Foreman, MD, 1 tablet at 04/18/15 1000 .  diltiazem (CARDIZEM) tablet 120 mg, 120 mg, Oral, Daily, Harrie Foreman, MD, 120 mg at 04/18/15 1000 .  docusate sodium (COLACE) capsule 100 mg, 100 mg, Oral, BID, Harrie Foreman, MD, 100 mg at 04/18/15 1000 .  fesoterodine (TOVIAZ) tablet 8 mg, 8 mg, Oral, Daily, Harrie Foreman, MD, 8 mg at 04/18/15 1000 .  heparin injection 5,000 Units, 5,000 Units, Subcutaneous, 3 times per day, Harrie Foreman, MD, 5,000 Units at 04/18/15 1341 .  mometasone-formoterol (DULERA) 100-5 MCG/ACT inhaler 2 puff, 2 puff, Inhalation, BID, Harrie Foreman, MD, 2 puff at 04/18/15 0800 .  nicotine (NICODERM CQ - dosed in mg/24 hours) patch 21 mg, 21 mg, Transdermal, Daily, Harrie Foreman, MD, 21 mg at 04/18/15 1022 .  ondansetron (ZOFRAN) tablet 4 mg, 4 mg, Oral, Q6H PRN **OR** ondansetron (ZOFRAN) injection 4 mg, 4 mg, Intravenous, Q6H PRN, Harrie Foreman, MD .  roflumilast [facility]) tablet 500 mcg, 500 mcg, Oral, Daily, Harrie Foreman, MD, 500 mcg at 04/18/15 1000 .  sertraline (ZOLOFT) tablet 150 mg, 150 mg, Oral, Daily, Harrie Foreman, MD, 150 mg at 04/18/15 1000 .  sodium chloride 0.9 % injection 3 mL, 3 mL, Intravenous, Q12H, Harrie Foreman, MD, 3 mL at 04/18/15 1022 .  sucralfate  (CARAFATE) tablet 1 g, 1 g, Oral, BID, Harrie Foreman, MD, 1 g at 04/18/15 1000 .  tiotropium (SPIRIVA) inhalation capsule 18 mcg, 18 mcg, Inhalation, Daily, Harrie Foreman, MD, 18 mcg at 04/18/15 1000  IMAGING    Ct Head Wo Contrast  04/18/2015   CLINICAL DATA:  Initial evaluation for syncope.  EXAM: CT HEAD WITHOUT CONTRAST  TECHNIQUE: Contiguous axial images were obtained from the base of the skull through the vertex without intravenous contrast.  COMPARISON:  Prior CT from 12/31/2013  FINDINGS: There is no acute intracranial hemorrhage or infarct. No mass lesion or midline shift. Gray-white matter differentiation is well maintained. Ventricles are normal in size without evidence of hydrocephalus. CSF containing spaces are within normal limits. No extra-axial fluid collection. Mild chronic small vessel ischemic changes present within the periventricular white matter. Scattered vascular calcifications present within the carotid siphons.  The calvarium is intact.  Orbital soft tissues are within normal limits.  The paranasal sinuses and mastoid air cells are well pneumatized and free of fluid.  Scalp soft tissues are unremarkable.  IMPRESSION: 1. No acute intracranial process. 2. Mild chronic small vessel ischemic disease.   Electronically Signed   By: Jeannine Boga M.D.   On: 04/18/2015 03:23   Dg Chest Portable 1 View  04/18/2015   CLINICAL DATA:  Syncope. Headache and leg weakness. Neck pain. Initial encounter.  EXAM: PORTABLE CHEST - 1 VIEW  COMPARISON:  Chest radiograph performed 03/01/2015  FINDINGS: Mild vascular congestion is noted. Mild bibasilar atelectasis is seen. No pleural effusion or pneumothorax is identified.  The cardiomediastinal silhouette is borderline normal in size. No  acute osseous abnormalities are identified.  IMPRESSION: Mild vascular congestion noted.  Mild bibasilar atelectasis seen.   Electronically Signed   By: Garald Balding M.D.   On: 04/18/2015 02:32      ASSESSMENT/PLAN  59 yo Female with acute on chronic respiratory distress and AECOPD  Acute on Chronic respiratory distress with respiratory acidosis - secondary to COPD and continued smoking - continue BiPAP and supply supplemental O2 as needed.  Will require Bipap at night (min 4 hrs) - abg prn - pulmicort nebs.  - scheduled duonebs q6hrs, q2hrs prn - stop Spiriva while in the ICU - steroids - solumedrol 63m IV q6hrs x 1 day, then transition to prednisone 490mtaper - maintain O2 sats>88% - limit use of narcotics and benzos - patient with risk factors for OSA and overlap syndrome (COPD + OSA) - will benefit from BiPAP while in hospital.   COPD - stage II, followed by Dr. FlRaul Delutpatient  - management as stated above - resume outpatient meds upon discharge (Breo, Spiriva, Daliresp, and PRN albuterol)   Tobacco abuse - cont with NicoDerm patch - tobacco cessation counseling done - patient still smoking about 1ppd  Hypertension - Continue diltiazem and bisoprolol with hydrochlorothiazide - maintain systolic <1<[id number]: The patient has trace edema of her lower extremities.Echo results are not available at this time. (Possible side effect of diltiazem?)   Follow up with Dr. FlRaul Dels an outpatient.   I have personally obtained a history, examined the patient, evaluated laboratory and imaging results, formulated the assessment and plan and placed orders.  The Patient requires high complexity decision making for assessment and support, frequent evaluation and titration of therapies, application of advanced monitoring technologies and extensive interpretation of multiple databases. Critical Care Time devoted to patient care services described in this note is 45 minutes.   Overall,pateint with moderate improvement in respiratory status.  Patient at high risk for cardiac arrest and death.   ViVilinda BoehringerMD [facility] Pager - [phone](Please enter 7-digits)     04/18/2015, 6:16 PM

## 2015-04-18 NOTE — ED Notes (Signed)
Pt was with friends and passed out. Denies any alcohol use. Pt states she felt like she was asleep. Reports feeling "not myself" this week. Pt is on 4L of oxygen at home and EMS reprts that her CO2 was in 50's and she was a bit incoherent. She also reports that she has had headache and weak legs and neck pain this week.

## 2015-04-18 NOTE — Progress Notes (Signed)
Per Dr. Darvin Neighbours sent patient to CCU for worsening of respiratory effort, enter pulmo consult routine, verbal order via telephone.

## 2015-04-18 NOTE — ED Notes (Signed)
Dr. Diamond in to assess pt.  

## 2015-04-19 LAB — BLOOD GAS, ARTERIAL
Acid-Base Excess: 5.1 mmol/L — ABNORMAL HIGH (ref 0.0–3.0)
Allens test (pass/fail): POSITIVE — AB
Bicarbonate: 32.2 mEq/L — ABNORMAL HIGH (ref 21.0–28.0)
Delivery systems: POSITIVE
Drawn by: 187461
Expiratory PAP: 10
FIO2: 0.35 %
Inspiratory PAP: 16
O2 Saturation: 85.6 %
Patient temperature: 37
RATE: 12 resp/min
pCO2 arterial: 57 mmHg — ABNORMAL HIGH (ref 32.0–48.0)
pH, Arterial: 7.36 (ref 7.350–7.450)
pO2, Arterial: 53 mmHg — ABNORMAL LOW (ref 83.0–108.0)

## 2015-04-19 LAB — MRSA PCR SCREENING: MRSA by PCR: NEGATIVE

## 2015-04-19 MED ORDER — FUROSEMIDE 10 MG/ML IJ SOLN
40.0000 mg | Freq: Once | INTRAMUSCULAR | Status: AC
  Administered 2015-04-19: 40 mg via INTRAVENOUS
  Filled 2015-04-19: qty 4

## 2015-04-19 MED ORDER — BUDESONIDE 0.5 MG/2ML IN SUSP
0.2500 mg | Freq: Four times a day (QID) | RESPIRATORY_TRACT | Status: DC
  Administered 2015-04-19: 0.5 mg via RESPIRATORY_TRACT
  Administered 2015-04-19: 0.25 mg via RESPIRATORY_TRACT
  Administered 2015-04-20: 0.5 mg via RESPIRATORY_TRACT
  Filled 2015-04-19 (×3): qty 2

## 2015-04-19 NOTE — Progress Notes (Signed)
Patient transferred to room 215 by bed with [name], orderly. Alert with no distress noted. Denies pain. VSS. Afebrile. Patient sent to new room on 4L nasal cannula with no distress noted. Family aware and at hospital.  Theresa [name]

## 2015-04-19 NOTE — Progress Notes (Signed)
[facility] [place] NAME: Theresa [name]    MR#:  [id number]  DATE OF BIRTH:  02-01-1956  SUBJECTIVE:  CHIEF COMPLAINT:   Chief Complaint  Patient presents with  . Loss of Consciousness   On Bipap. More awake. Wants to eat.  REVIEW OF SYSTEMS:    Review of Systems  Constitutional: Positive for malaise/fatigue. Negative for fever and chills.  HENT: Negative for sore throat.   Eyes: Negative for blurred vision, double vision and pain.  Respiratory: Positive for cough, shortness of breath and wheezing. Negative for hemoptysis.   Cardiovascular: Positive for chest pain (lower chest and upper abd pain on coughing). Negative for palpitations, orthopnea and leg swelling.  Gastrointestinal: Negative for heartburn, nausea, vomiting, abdominal pain, diarrhea and constipation.  Genitourinary: Negative for dysuria and hematuria.  Musculoskeletal: Negative for back pain and joint pain.  Skin: Negative for rash.  Neurological: Positive for weakness. Negative for sensory change, speech change, focal weakness and headaches.  Endo/Heme/Allergies: Does not bruise/bleed easily.  Psychiatric/Behavioral: Negative for depression. The patient is nervous/anxious.       DRUG ALLERGIES:   Allergies  Allergen Reactions  . Codeine Nausea And Vomiting    VITALS:  Blood pressure 126/84, pulse 70, temperature 98.3 F (36.8 C), temperature source Axillary, resp. rate 16, height _0  (1.575 m), weight 108.41 kg (239 lb), SpO2 93 %.  PHYSICAL EXAMINATION:   Physical Exam  GENERAL:  59 y.o.-year-old patient lying in the bed . Obese. EYES: Pupils equal, round, reactive to light and accommodation. No scleral icterus. Extraocular muscles intact.  HEENT: Head atraumatic, normocephalic. Oropharynx and nasopharynx clear.  NECK:  Supple, no jugular venous distention. No thyroid enlargement, no tenderness.  LUNGS: Increased work of breathing and  weezing CARDIOVASCULAR: S1, S2 normal. No murmurs, rubs, or gallops.  ABDOMEN: Soft, nontender, nondistended. Bowel sounds present. No organomegaly or mass.  EXTREMITIES: No cyanosis, clubbing or edema b/l.    NEUROLOGIC: Cranial nerves II through XII are intact. No focal Motor or sensory deficits b/l.   PSYCHIATRIC: The patient is awake SKIN: No obvious rash, lesion, or ulcer.    LABORATORY PANEL:   CBC  Recent Labs Lab 04/18/15 0225  WBC 11.0  HGB 16.1*  HCT 49.9*  PLT 153   ------------------------------------------------------------------------------------------------------------------  Chemistries   Recent Labs Lab 04/18/15 0225  NA 145  K 3.9  CL 103  CO2 33*  GLUCOSE 121*  BUN 23*  CREATININE 0.79  CALCIUM 9.1  AST 19  ALT 17  ALKPHOS 72  BILITOT 0.3   ------------------------------------------------------------------------------------------------------------------  Cardiac Enzymes  Recent Labs Lab 04/18/15 0225  TROPONINI <0.03   ------------------------------------------------------------------------------------------------------------------  RADIOLOGY:  Ct Head Wo Contrast  04/18/2015   CLINICAL DATA:  Initial evaluation for syncope.  EXAM: CT HEAD WITHOUT CONTRAST  TECHNIQUE: Contiguous axial images were obtained from the base of the skull through the vertex without intravenous contrast.  COMPARISON:  Prior CT from 12/31/2013  FINDINGS: There is no acute intracranial hemorrhage or infarct. No mass lesion or midline shift. Gray-white matter differentiation is well maintained. Ventricles are normal in size without evidence of hydrocephalus. CSF containing spaces are within normal limits. No extra-axial fluid collection. Mild chronic small vessel ischemic changes present within the periventricular white matter. Scattered vascular calcifications present within the carotid siphons.  The calvarium is intact.  Orbital soft tissues are within normal limits.   The paranasal sinuses and mastoid air cells are well pneumatized and free  of fluid.  Scalp soft tissues are unremarkable.  IMPRESSION: 1. No acute intracranial process. 2. Mild chronic small vessel ischemic disease.   Electronically Signed   By: Jeannine Boga M.D.   On: 04/18/2015 03:23   Dg Chest Portable 1 View  04/18/2015   CLINICAL DATA:  Syncope. Headache and leg weakness. Neck pain. Initial encounter.  EXAM: PORTABLE CHEST - 1 VIEW  COMPARISON:  Chest radiograph performed 03/01/2015  FINDINGS: Mild vascular congestion is noted. Mild bibasilar atelectasis is seen. No pleural effusion or pneumothorax is identified.  The cardiomediastinal silhouette is borderline normal in size. No acute osseous abnormalities are identified.  IMPRESSION: Mild vascular congestion noted.  Mild bibasilar atelectasis seen.   Electronically Signed   By: Garald Balding M.D.   On: 04/18/2015 02:32     ASSESSMENT AND PLAN:   This is a 59 year old Caucasian female admitted for acute on chronic respiratory failure and somnolence.  * Acute on chronic respiratory failure due to COPD exacerbation -IV steroids, Antibiotics, Scheduled Nebulizers, Inhalers - Consulted pulmonary. Appreciate help. -Bipap at night - Will need CPAP at home Transfer to floor. Still unstable for discharge  * Acute encephalopathy due to elevated CO2  * Hypertension: Continue diltiazem and bisoprolol with hydrochlorothiazide  * Chronic diastolic CHF Stable  * Coronary artery disease: Stable  * Depression: Continue Zoloft  All the records are reviewed and case discussed with Care Management/Social Workerr. Management plans discussed with the patient, family and they are in agreement.  CODE STATUS: FULL  DVT Prophylaxis: SCDs, Lovenox  TOTAL CRITICAL CARE TIME TAKING CARE OF THIS PATIENT: 35 minutes.    [name] R M.D on 04/19/2015 at 9:24 AM  Between 7am to 6pm - Pager - [phone]  After 6pm go to [email or web address] -  password [email or web address] [facility]  [place] [facility]  Office  [phone]  CC: Primary care physician; [name], PA

## 2015-04-19 NOTE — Progress Notes (Signed)
Report called to [place], RN on [place].

## 2015-04-19 NOTE — Progress Notes (Signed)
[place] [facility] [place] NAME: Theresa [name]    MR#:  [id number]  DATE OF BIRTH:  [birth year]  SUBJECTIVE:  CHIEF COMPLAINT:   Chief Complaint  Patient presents with  . Loss of Consciousness   Critically ill, drowzy. SOB REVIEW OF SYSTEMS:    ROS  Unobtainable due to encephalopathy  DRUG ALLERGIES:   Allergies  Allergen Reactions  . Codeine Nausea And Vomiting    VITALS:  Blood pressure 126/84, pulse 70, temperature 98.3 F (36.8 C), temperature source Axillary, resp. rate 16, height _0  (1.575 m), weight 108.41 kg (239 lb), SpO2 93 %.  PHYSICAL EXAMINATION:   Physical Exam  GENERAL:  59 y.o.-year-old patient lying in the bed looks critically ill EYES: Pupils equal, round, reactive to light and accommodation. No scleral icterus. Extraocular muscles intact.  HEENT: Head atraumatic, normocephalic. Oropharynx and nasopharynx clear.  NECK:  Supple, no jugular venous distention. No thyroid enlargement, no tenderness.  LUNGS: Increased work of breathing and weezing CARDIOVASCULAR: S1, S2 normal. No murmurs, rubs, or gallops.  ABDOMEN: Soft, nontender, nondistended. Bowel sounds present. No organomegaly or mass.  EXTREMITIES: No cyanosis, clubbing or edema b/l.    NEUROLOGIC: Cranial nerves II through XII are intact. No focal Motor or sensory deficits b/l.   PSYCHIATRIC: The patient is drowzy SKIN: No obvious rash, lesion, or ulcer.    LABORATORY PANEL:   CBC  Recent Labs Lab 04/18/15 0225  WBC 11.0  HGB 16.1*  HCT 49.9*  PLT 153   ------------------------------------------------------------------------------------------------------------------  Chemistries   Recent Labs Lab 04/18/15 0225  NA 145  K 3.9  CL 103  CO2 33*  GLUCOSE 121*  BUN 23*  CREATININE 0.79  CALCIUM 9.1  AST 19  ALT 17  ALKPHOS 72  BILITOT 0.3    ------------------------------------------------------------------------------------------------------------------  Cardiac Enzymes  Recent Labs Lab 04/18/15 0225  TROPONINI <0.03   ------------------------------------------------------------------------------------------------------------------  RADIOLOGY:  Ct Head Wo Contrast  04/18/2015   CLINICAL DATA:  Initial evaluation for syncope.  EXAM: CT HEAD WITHOUT CONTRAST  TECHNIQUE: Contiguous axial images were obtained from the base of the skull through the vertex without intravenous contrast.  COMPARISON:  Prior CT from 12/31/2013  FINDINGS: There is no acute intracranial hemorrhage or infarct. No mass lesion or midline shift. Gray-white matter differentiation is well maintained. Ventricles are normal in size without evidence of hydrocephalus. CSF containing spaces are within normal limits. No extra-axial fluid collection. Mild chronic small vessel ischemic changes present within the periventricular white matter. Scattered vascular calcifications present within the carotid siphons.  The calvarium is intact.  Orbital soft tissues are within normal limits.  The paranasal sinuses and mastoid air cells are well pneumatized and free of fluid.  Scalp soft tissues are unremarkable.  IMPRESSION: 1. No acute intracranial process. 2. Mild chronic small vessel ischemic disease.   Electronically Signed   By: Jeannine Boga M.D.   On: 04/18/2015 03:23   Dg Chest Portable 1 View  04/18/2015   CLINICAL DATA:  Syncope. Headache and leg weakness. Neck pain. Initial encounter.  EXAM: PORTABLE CHEST - 1 VIEW  COMPARISON:  Chest radiograph performed 03/01/2015  FINDINGS: Mild vascular congestion is noted. Mild bibasilar atelectasis is seen. No pleural effusion or pneumothorax is identified.  The cardiomediastinal silhouette is borderline normal in size. No acute osseous abnormalities are identified.  IMPRESSION: Mild vascular congestion noted.  Mild bibasilar  atelectasis seen.   Electronically Signed   By: Garald Balding  M.D.   On: 04/18/2015 02:32     ASSESSMENT AND PLAN:   This is a 59 year old Caucasian female admitted for acute on chronic respiratory failure and somnolence.  * Acute on chronic respiratory failure due to COPD exacerbation -IV steroids, Antibiotics, Scheduled Nebulizers, Inhalers - Consult pulmonary -Bipap Will need CPAP at home Critically ill with worsening Pco2 on Bipap. Transfer to ICU. May need intubation.  * Acute encephalopathy due to elevated CO2  * Hypertension: Continue diltiazem and bisoprolol with hydrochlorothiazide  * Chronic diastolic CHF Stable   * Coronary artery disease: Stable  * Depression: Continue Zoloft  All the records are reviewed and case discussed with Care Management/Social Workerr. Management plans discussed with the patient, family and they are in agreement.  CODE STATUS: FULL  DVT Prophylaxis: SCDs, Lovenox  TOTAL CRITICAL CARE TIME TAKING CARE OF THIS PATIENT: 35 minutes.    [name] R M.D on 04/19/2015 at 9:17 AM  Between 7am to 6pm - Pager - [phone]  After 6pm go to [email or web address] - password [email or web address] [facility]  [place] [facility]  Office  [phone]  CC: Primary care physician; [name], PA

## 2015-04-20 LAB — BASIC METABOLIC PANEL
Anion gap: 8 (ref 5–15)
BUN: 30 mg/dL — ABNORMAL HIGH (ref 6–20)
CO2: 31 mmol/L (ref 22–32)
Calcium: 9.1 mg/dL (ref 8.9–10.3)
Chloride: 103 mmol/L (ref 101–111)
Creatinine, Ser: 0.62 mg/dL (ref 0.44–1.00)
GFR calc Af Amer: >60 mL/min (ref >60)
GFR calc non Af Amer: >60 mL/min (ref >60)
Glucose, Bld: 114 mg/dL — ABNORMAL HIGH (ref 65–99)
Potassium: 3.7 mmol/L (ref 3.5–5.1)
Sodium: 142 mmol/L (ref 135–145)

## 2015-04-20 MED ORDER — OXYCODONE HCL 5 MG PO TABS
5.0000 mg | ORAL_TABLET | ORAL | Status: DC | PRN
  Administered 2015-04-20 – 2015-04-21 (×2): 5 mg via ORAL
  Filled 2015-04-20 (×2): qty 1

## 2015-04-20 MED ORDER — BUDESONIDE 0.5 MG/2ML IN SUSP
0.5000 mg | Freq: Two times a day (BID) | RESPIRATORY_TRACT | Status: DC
  Administered 2015-04-20 – 2015-04-21 (×2): 0.5 mg via RESPIRATORY_TRACT
  Filled 2015-04-20 (×2): qty 2

## 2015-04-20 MED ORDER — MORPHINE SULFATE 2 MG/ML IJ SOLN: 2.0000 mg | INTRAMUSCULAR | Status: DC | PRN

## 2015-04-20 MED ORDER — FUROSEMIDE 10 MG/ML IJ SOLN
40.0000 mg | Freq: Once | INTRAMUSCULAR | Status: AC
  Administered 2015-04-20: 40 mg via INTRAVENOUS
  Filled 2015-04-20: qty 4

## 2015-04-20 NOTE — Progress Notes (Signed)
[place] [facility] [place] NAME: Theresa [name]    MR#:  [id number]  DATE OF BIRTH:  1956-09-14  SUBJECTIVE:  CHIEF COMPLAINT:   Chief Complaint  Patient presents with  . Loss of Consciousness   Awake and alert. Sitting in chair. Still has wheezing. Cough. Some sputum. REVIEW OF SYSTEMS:    Review of Systems  Constitutional: Positive for malaise/fatigue. Negative for fever and chills.  HENT: Negative for sore throat.   Eyes: Negative for blurred vision, double vision and pain.  Respiratory: Positive for cough, shortness of breath and wheezing. Negative for hemoptysis.   Cardiovascular: Positive for chest pain (lower chest and upper abd pain on coughing). Negative for palpitations, orthopnea and leg swelling.  Gastrointestinal: Negative for heartburn, nausea, vomiting, abdominal pain, diarrhea and constipation.  Genitourinary: Negative for dysuria and hematuria.  Musculoskeletal: Negative for back pain and joint pain.  Skin: Negative for rash.  Neurological: Positive for weakness. Negative for sensory change, speech change, focal weakness and headaches.  Endo/Heme/Allergies: Does not bruise/bleed easily.  Psychiatric/Behavioral: Negative for depression. The patient is nervous/anxious.       DRUG ALLERGIES:   Allergies  Allergen Reactions  . Codeine Nausea And Vomiting    VITALS:  Blood pressure 132/69, pulse 67, temperature 98.4 F (36.9 C), temperature source Oral, resp. rate 17, height _0  (1.575 m), weight 106.369 kg (234 lb 8 oz), SpO2 95 %.  PHYSICAL EXAMINATION:   Physical Exam  GENERAL:  59 y.o.-year-old patient lying in the bed . Obese. EYES: Pupils equal, round, reactive to light and accommodation. No scleral icterus. Extraocular muscles intact.  HEENT: Head atraumatic, normocephalic. Oropharynx and nasopharynx clear.  NECK:  Supple, no jugular venous distention. No thyroid enlargement, no tenderness.  LUNGS:  Increased work of breathing and weezing CARDIOVASCULAR: S1, S2 normal. No murmurs, rubs, or gallops.  ABDOMEN: Soft, nontender, nondistended. Bowel sounds present. No organomegaly or mass.  EXTREMITIES: No cyanosis, clubbing or edema b/l.    NEUROLOGIC: Cranial nerves II through XII are intact. No focal Motor or sensory deficits b/l.   PSYCHIATRIC: The patient is awake SKIN: No obvious rash, lesion, or ulcer.    LABORATORY PANEL:   CBC  Recent Labs Lab 04/18/15 0225  WBC 11.0  HGB 16.1*  HCT 49.9*  PLT 153   ------------------------------------------------------------------------------------------------------------------  Chemistries   Recent Labs Lab 04/18/15 0225 04/20/15 0555  NA 145 142  K 3.9 3.7  CL 103 103  CO2 33* 31  GLUCOSE 121* 114*  BUN 23* 30*  CREATININE 0.79 0.62  CALCIUM 9.1 9.1  AST 19  --   ALT 17  --   ALKPHOS 72  --   BILITOT 0.3  --    ------------------------------------------------------------------------------------------------------------------  Cardiac Enzymes  Recent Labs Lab 04/18/15 0225  TROPONINI <0.03   ------------------------------------------------------------------------------------------------------------------  RADIOLOGY:  No results found.   ASSESSMENT AND PLAN:   This is a 59 year old Caucasian female admitted for acute on chronic respiratory failure and somnolence.  * Acute on chronic respiratory failure due to COPD exacerbation -IV steroids, Antibiotics, Scheduled Nebulizers, Inhalers - Consulted pulmonary. Appreciate help. -Bipap at night - Will need CPAP at home   * Acute encephalopathy due to elevated CO2 - resolved  * Hypertension: Continue diltiazem and bisoprolol with hydrochlorothiazide  * Acute on Chronic diastolic CHF Responding well to Lasix. Will need PO lasix at discharge  * Coronary artery disease: Stable  * Depression: Continue Zoloft  All the records  are reviewed and case discussed  with Care Management/Social Workerr. Management plans discussed with the patient, family and they are in agreement.  CODE STATUS: FULL  DVT Prophylaxis: SCDs, Lovenox  TOTAL TIME TAKING CARE OF THIS PATIENT: 35 minutes.   D/C tomorrow with home ventilation at night   [name] R M.D on 04/20/2015 at 11:35 AM  Between 7am to 6pm - Pager - [phone]  After 6pm go to [email or web address] - password [email or web address] [facility]  [facility]  Office  [phone]  CC: Primary care physician; [name], PA

## 2015-04-20 NOTE — Progress Notes (Signed)
bipap applied. Titrated pressures to 12/8 due to too much air per patient. Patient tolerated well

## 2015-04-20 NOTE — Care Management (Signed)
IM given °

## 2015-04-21 DIAGNOSIS — M779 Enthesopathy, unspecified: Secondary | ICD-10-CM | POA: Insufficient documentation

## 2015-04-21 DIAGNOSIS — D689 Coagulation defect, unspecified: Secondary | ICD-10-CM | POA: Insufficient documentation

## 2015-04-21 DIAGNOSIS — I10 Essential (primary) hypertension: Secondary | ICD-10-CM | POA: Insufficient documentation

## 2015-04-21 LAB — BLOOD GAS, ARTERIAL
Acid-Base Excess: 5.6 mmol/L — ABNORMAL HIGH (ref 0.0–3.0)
Acid-Base Excess: 6.8 mmol/L — ABNORMAL HIGH (ref 0.0–3.0)
Allens test (pass/fail): POSITIVE — AB
Bicarbonate: 34.4 mEq/L — ABNORMAL HIGH (ref 21.0–28.0)
Bicarbonate: 35.7 mEq/L — ABNORMAL HIGH (ref 21.0–28.0)
Delivery systems: POSITIVE
Drawn by: 187561
Expiratory PAP: 6
FIO2: 0.36 %
FIO2: 0.28 %
Inspiratory PAP: 16
O2 Saturation: 88.2 %
O2 Saturation: 85.1 %
Patient temperature: 37
Patient temperature: 37
RATE: 8 resp/min
pCO2 arterial: 70 mmHg (ref 32.0–48.0)
pCO2 arterial: 71 mmHg (ref 32.0–48.0)
pH, Arterial: 7.3 — ABNORMAL LOW (ref 7.350–7.450)
pH, Arterial: 7.31 — ABNORMAL LOW (ref 7.350–7.450)
pO2, Arterial: 61 mmHg — ABNORMAL LOW (ref 83.0–108.0)
pO2, Arterial: 55 mmHg — ABNORMAL LOW (ref 83.0–108.0)

## 2015-04-21 LAB — BLOOD GAS, VENOUS
Patient temperature: 37
pCO2, Ven: 73 mmHg (ref 44.0–60.0)
pCO2, Ven: 88 mmHg (ref 44.0–60.0)
pH, Ven: 7.28 — ABNORMAL LOW (ref 7.320–7.430)
pH, Ven: 7.24 — ABNORMAL LOW (ref 7.320–7.430)

## 2015-04-21 MED ORDER — IPRATROPIUM-ALBUTEROL 0.5-2.5 (3) MG/3ML IN SOLN: 3.0000 mL | Freq: Four times a day (QID) | RESPIRATORY_TRACT | Status: DC

## 2015-04-21 MED ORDER — LEVOFLOXACIN 500 MG PO TABS: 500.0000 mg | ORAL_TABLET | Freq: Every day | ORAL | Status: DC

## 2015-04-21 MED ORDER — PREDNISONE 20 MG PO TABS: ORAL_TABLET | ORAL | Status: DC

## 2015-04-21 NOTE — Progress Notes (Signed)
Pt. Is in no respiratory distress. She refused to go back on bipap. She says she is being discharged today and does not need the bipap at this time.

## 2015-04-21 NOTE — Progress Notes (Signed)
Pt d/c to home today portable home O2.  IV removed intact.  Pt d/c instructions reviewed and all questions and concerns addressed.  Rx printed and given to pt w/all questions and concerns addressed.  Medication Education printed on D/C paperwork.  Pt has boyfriend at bedside for transport home.

## 2015-04-21 NOTE — Progress Notes (Signed)
Date: 04/21/2015,   MRN# [id number] Theresa [name] 10-20-1955 Code Status:     Code Status Orders        Start     Ordered   04/18/15 0600  Full code   Continuous     04/18/15 0559      HPI: In chair, no respiratory distress, tolerating Bipap well, d/c plans in progress. Mentating well. No wheezing or chest pain, no leg pain or swelling  PMHX:   Past Medical History  Diagnosis Date  . COPD (chronic obstructive pulmonary disease)   . Hypertension   . Gastritis   . Lumbar radiculitis   . Muscle weakness   . DDD (degenerative disc disease), cervical   . Depression   . Chronic rhinitis   . Chronic generalized abdominal pain   . Anxiety   . Edema   . Carpal tunnel syndrome   . Low back pain   . Hematuria   . Malodorous urine   . Sensory urge incontinence   . Obesity   . Renal cyst   . Tobacco abuse    Surgical Hx:  Past Surgical History  Procedure Laterality Date  . Tubal ligation    . Abdominal hysterectomy    . Tonsillectomy     Family Hx:  Family History  Problem Relation Age of Onset  . Kidney disease Mother   . Prostate cancer Neg Hx   . Kidney cancer Brother   . Lung cancer Sister   . Coronary artery disease Mother    Social Hx:   History  Substance Use Topics  . Smoking status: Current Every Day Smoker -- 2.00 packs/day for 40 years    Types: Cigarettes  . Smokeless tobacco: Not on file     Comment: 30+ years  . Alcohol Use: No   Medication:    Home Medication:  Current Outpatient Rx  Name  Route  Sig  Dispense  Refill  . ipratropium-albuterol (DUONEB) 0.5-2.5 (3) MG/3ML SOLN   Nebulization   Take 3 mLs by nebulization every 6 (six) hours.   360 mL   0   . levofloxacin (LEVAQUIN) 500 MG tablet   Oral   Take 1 tablet (500 mg total) by mouth daily.   5 tablet   0   . predniSONE (DELTASONE) 20 MG tablet      2 tabs po daily x 4 days   8 tablet   0     Current Medication: _0 @   Allergies:  Codeine  Review of  Systems: Gen:  Denies  fever, sweats, chills HEENT: Denies blurred vision, double vision, ear pain, eye pain, hearing loss, nose bleeds, sore throat Cvc:  No dizziness, chest pain or heaviness Resp:   Less sob, mentating well Gi: Denies swallowing difficulty, stomach pain, nausea or vomiting, diarrhea, constipation, bowel incontinence Gu:  Denies bladder incontinence, burning urine Ext:   No Joint pain, stiffness or swelling Skin: No skin rash, easy bruising or bleeding or hives Endoc:  No polyuria, polydipsia , polyphagia or weight change Psych: No depression, insomnia or hallucinations  Other:  All other systems negative  Physical Examination:   VS: BP 107/63 mmHg  Pulse 58  Temp(Src) 97.7 F (36.5 C) (Oral)  Resp 16  Ht _1  (1.575 m)  Wt 236 lb 3.2 oz (107.14 kg)  BMI 43.19 kg/m2  SpO2 98%  General Appearance: No distress  Neuro: without focal findings, mental status, speech normal, alert and oriented, cranial nerves 2-12 intact, reflexes normal and  symmetric, sensation grossly normal  HEENT: PERRLA, EOM intact, no ptosis, no other lesions noticed, Mallampati: Pulmonary:.No wheezing, No rales  Sputum Production:   Cardiovascular:  Normal S1,S2.  No m/r/g.  Abdominal aorta pulsation normal.    Abdomen:Benign, Soft, non-tender, No masses, hepatosplenomegaly, No lymphadenopathy Endoc: No evident thyromegaly, no signs of acromegaly or Cushing features Skin:   warm, no rashes, no ecchymosis  Extremities: normal, no cyanosis, clubbing, no edema, warm with normal capillary refill. Other findings:   Labs results:   Recent Labs     04/20/15  0555  BUN  30*  CREATININE  0.62  GLUCOSE  114*  CALCIUM  9.1  ,    Assessment and Plan: 59 yo Female with acute on chronic respiratory distress, impending hypercapnic resp faiure with mental status changes  Acute on Chronic respiratory distress with respiratory acidosis - secondary to COPD, stage II and continued smoking - plan to  d/c on trilogy with (min 4 hrs) - resume pre admit copd regimen, pred tapering dose - maintain O2 sats>88% - limit use of narcotics and benzos - see me in one week  Suspect she also sleep apnea - out patient sleep study  Tobacco abuse - cont with NicoDerm patch   I have personally obtained a history, examined the patient, evaluated laboratory and imaging results, formulated the assessment and plan and placed orders.  The Patient requires high complexity decision making for assessment and support, frequent evaluation and titration of therapies, application of advanced monitoring technologies and extensive interpretation of multiple databases.   Herbon Fleming,M.D. Pulmonary & Critical care Medicine [facility]

## 2015-04-21 NOTE — Progress Notes (Signed)
SATURATION QUALIFICATIONS: (This note is used to comply with regulatory documentation for home oxygen)  Patient Saturations on Room Air at Rest = 90%  Patient Saturations on Room Air while Ambulating = 72%  Patient Saturations on 3 Liters of oxygen while Ambulating = 79%  Please briefly explain why patient needs home oxygen:  Pt is 72% O2 Saturation while Ambulating on Room Air as well as 79% on 3L of O2 while ambulating.

## 2015-04-21 NOTE — Care Management (Signed)
Important Message  Patient Details  Name: Theresa [name] MRN: [id number] Date of Birth: [birth year]   Medicare Important Message Given:  Yes-second notification given    Darius Bump Allmond 04/21/2015, 10:43 AM

## 2015-04-21 NOTE — Discharge Instructions (Signed)
DIET:  Cardiac diet  DISCHARGE CONDITION:  Stable  ACTIVITY:  Activity as tolerated  OXYGEN:  Home Oxygen: Yes.     Oxygen Delivery: 3 liters/min via Patient connected to nasal cannula oxygen  DISCHARGE LOCATION:  home   If you experience worsening of your admission symptoms, develop shortness of breath, life threatening emergency, suicidal or homicidal thoughts you must seek medical attention immediately by calling 911 or calling your MD immediately  if symptoms less severe.  You Must read complete instructions/literature along with all the possible adverse reactions/side effects for all the Medicines you take and that have been prescribed to you. Take any new Medicines after you have completely understood and accpet all the possible adverse reactions/side effects.   Please note  You were cared for by a hospitalist during your hospital stay. If you have any questions about your discharge medications or the care you received while you were in the hospital after you are discharged, you can call the unit and asked to speak with the hospitalist on call if the hospitalist that took care of you is not available. Once you are discharged, your primary care physician will handle any further medical issues. Please note that NO REFILLS for any discharge medications will be authorized once you are discharged, as it is imperative that you return to your primary care physician (or establish a relationship with a primary care physician if you do not have one) for your aftercare needs so that they can reassess your need for medications and monitor your lab values.  USE VENTILATION DEVICE DAILY AT NIGHT WHILE SLEEPING

## 2015-04-21 NOTE — Progress Notes (Addendum)
Ordered NIV / Trilogy For COPD, chronic respiratory failure with hypercapnia. Tried on Bipap but failed due to breath stacking. NIV prevent deterioration and further readmission.

## 2015-04-21 NOTE — Care Management Note (Signed)
Case Management Note  Patient Details  Name: Theresa [name] MRN: [id number] Date of Birth: 07/14/1956  Subjective/Objective:                   Spoke with patient for discharge planning. Stated that she is from home with her husband. Hx of COPD.  Has walker and cane at home and house is all on one level.  O2 provided by [employer] which she stated that she is not happy with service. Patient would like to change companies. Attending physician would like to order [employer] machine for home use. Contacted Jeneen Rinks with [employer] who will come to hospital for paperwork. Contacted Attending to make aware of representative paperwork needs. Patient has an old Chiropodist that was purchased at a "yard sale" will request DME for this as well.  Order placed for COPD gold. Action/Plan:   Expected Discharge Date:                Expected Discharge Plan:  Home/Self Care  In-House Referral:     Discharge planning Services  CM Consult  Post Acute Care Choice:  Durable Medical Equipment Choice offered to:  Patient  DME Arranged:  Oxygen, Ventilator, Nebulizer machine DME Agency:     HH Arranged:    HH Agency:  NA  Status of Service:  In process, will continue to follow  Medicare Important Message Given:  Yes-second notification given Date Medicare IM Given:    Medicare IM give by:    Date Additional Medicare IM Given:    Additional Medicare Important Message give by:     If discussed at [place] of Stay Meetings, dates discussed:    Additional Comments:  Alvie Heidelberg, RN 04/21/2015, 10:45 AM

## 2015-04-21 NOTE — Care Management (Signed)
Patient is setup for delivery of Trilogy and new nebulizer by Jeneen Rinks at [facility]. Home O2 is provided by [employer] but patient will be switching services to [place]. MD was informed of patient O2 sats while qualifying for Home O2. Patient to be discharged today to home with self care.

## 2015-04-22 NOTE — Discharge Summary (Signed)
[facility] NAME: Theresa [name]    MR#:  [id number]  DATE OF BIRTH:  05-28-1956  DATE OF ADMISSION:  04/18/2015 ADMITTING PHYSICIAN: Harrie Foreman, MD  DATE OF DISCHARGE: 04/21/2015  6:46 PM  PRIMARY CARE PHYSICIAN: [name], PA    ADMISSION DIAGNOSIS:  Chronic obstructive pulmonary disease with acute exacerbation [J44.1]  DISCHARGE DIAGNOSIS:  Active Problems:   Chronic respiratory acidosis   SECONDARY DIAGNOSIS:   Past Medical History  Diagnosis Date  . COPD (chronic obstructive pulmonary disease)   . Hypertension   . Gastritis   . Lumbar radiculitis   . Muscle weakness   . DDD (degenerative disc disease), cervical   . Depression   . Chronic rhinitis   . Chronic generalized abdominal pain   . Anxiety   . Edema   . Carpal tunnel syndrome   . Low back pain   . Hematuria   . Malodorous urine   . Sensory urge incontinence   . Obesity   . Renal cyst   . Tobacco abuse      ADMITTING HISTORY  Chief Complaint: Somnolence HPI: The patient presents emergency department via EMS after collapsing outside her home today. The patient had been sitting outside and appeared weaker and more somnolent despite increasing her oxygen via nasal cannula that she wears 24/7. She started to walk toward the house to sit in the cooler air when she collapsed. She remained conscious but required the help of 3 people to walk her into the home where she sat in a chair and immediately fell asleep. The daughter reports that she could not awaken the patient for at least 5 minutes. By the time she did wake up she was very confused. The patient's daughter states that she has been falling asleep easily for the last week. Due to her increased weakness and somnolence her daughter has increased her home O2 via nasal cannula to 4 L/m area in the emergency department the patient was found to have a respiratory acidosis and continued  somnolence which prompted the emergency physician to call the hospitalist service for admission.   HOSPITAL COURSE:   This is a 59 year old Caucasian female admitted for acute on chronic respiratory failure and somnolence.  * Acute on chronic respiratory failure due to COPD exacerbation -IV steroids, Antibiotics, Scheduled Nebulizers, Inhalers. Changed to PO at discharge. - Consulted pulmonary. Appreciate help. -Bipap at night - NIV/ trilogy set up at home prior to discharge. Discussed with her pulmonologist Dr. Raul Del regarding plan.  * Acute encephalopathy due to elevated CO2 - resolved  * Hypertension: Continue diltiazem and bisoprolol with hydrochlorothiazide  * Acute on Chronic diastolic CHF Responded well to Lasix.  * Coronary artery disease: Stable  * Depression: Continue Zoloft   CONSULTS OBTAINED:     DRUG ALLERGIES:   Allergies  Allergen Reactions  . Codeine Nausea And Vomiting    DISCHARGE MEDICATIONS:   Discharge Medication List as of 04/21/2015 11:39 AM    START taking these medications   Details  ipratropium-albuterol (DUONEB) 0.5-2.5 (3) MG/3ML SOLN Take 3 mLs by nebulization every 6 (six) hours., Starting 04/21/2015, Until Discontinued, Print      CONTINUE these medications which have CHANGED   Details  levofloxacin (LEVAQUIN) 500 MG tablet Take 1 tablet (500 mg total) by mouth daily., Starting 04/21/2015, Until Discontinued, Print    predniSONE (DELTASONE) 20 MG tablet 2 tabs po daily x 4 days, Print  CONTINUE these medications which have NOT CHANGED   Details  bisoprolol-hydrochlorothiazide (ZIAC) 10-6.25 MG per tablet Take 1 tablet by mouth daily., Until Discontinued, Historical Med    diltiazem (CARDIZEM) 120 MG tablet Take 120 mg by mouth daily., Until Discontinued, Historical Med    estradiol (ESTRACE) 1 MG tablet Take 1 mg by mouth daily., Until Discontinued, Historical Med    fesoterodine (TOVIAZ) 8 MG TB24 tablet Take 8 mg by mouth  daily., Until Discontinued, Historical Med    Fluticasone-Salmeterol (ADVAIR) 250-50 MCG/DOSE AEPB Inhale 1 puff into the lungs every 12 (twelve) hours., Until Discontinued, Historical Med    roflumilast (DALIRESP) 500 MCG TABS tablet Take 500 mcg by mouth daily., Until Discontinued, Historical Med    sertraline (ZOLOFT) 100 MG tablet Take 150 mg by mouth daily. , Until Discontinued, Historical Med    sucralfate (CARAFATE) 1 G tablet Take 1 g by mouth 2 (two) times daily., Until Discontinued, Historical Med    tiotropium (SPIRIVA) 18 MCG inhalation capsule Place 18 mcg into inhaler and inhale daily., Until Discontinued, Historical Med    albuterol (PROVENTIL HFA;VENTOLIN HFA) 108 (90 BASE) MCG/ACT inhaler Inhale 2 puffs into the lungs every 6 (six) hours as needed for wheezing., Until Discontinued, Historical Med    Aspirin-Caffeine (BC FAST PAIN RELIEF ARTHRITIS) 1000-65 MG PACK Take 1 packet by mouth every 6 (six) hours as needed (Pain)., Until Discontinued, Historical Med    BREO ELLIPTA 100-25 MCG/INH AEPB Starting 01/09/2015, Until Discontinued, Historical Med    chlorpheniramine-HYDROcodone (TUSSIONEX PENNKINETIC ER) 10-8 MG/5ML SUER Take 5 mLs by mouth every 12 (twelve) hours as needed for cough., Starting 03/01/2015, Until Discontinued, Normal    mirabegron ER (MYRBETRIQ) 50 MG TB24 tablet Take 50 mg by mouth daily., Starting 10/14/2014, Until Discontinued, Historical Med      STOP taking these medications     predniSONE (STERAPRED UNI-PAK 21 TAB) 10 MG (21) TBPK tablet          Today    VITAL SIGNS:  Blood pressure 107/63, pulse 58, temperature 97.7 F (36.5 C), temperature source Oral, resp. rate 16, height _0  (1.575 m), weight 107.14 kg (236 lb 3.2 oz), SpO2 95 %.  I/O:   Intake/Output Summary (Last 24 hours) at 04/22/15 1158 Last data filed at 04/21/15 1700  Gross per 24 hour  Intake    120 ml  Output      0 ml  Net    120 ml    PHYSICAL EXAMINATION:   Physical Exam  GENERAL:  59 y.o.-year-old patient lying in the bed with no acute distress. Obese.On O2. LUNGS: Normal breath sounds bilaterally, no wheezing, rales,rhonchi or crepitation. No use of accessory muscles of respiration.  CARDIOVASCULAR: S1, S2 normal. No murmurs, rubs, or gallops.  ABDOMEN: Soft, non-tender, non-distended. Bowel sounds present. No organomegaly or mass.  NEUROLOGIC: Moves all 4 extremities. PSYCHIATRIC: The patient is alert and oriented x 3.  SKIN: No obvious rash, lesion, or ulcer.   DATA REVIEW:   CBC  Recent Labs Lab 04/18/15 0225  WBC 11.0  HGB 16.1*  HCT 49.9*  PLT 153    Chemistries   Recent Labs Lab 04/18/15 0225 04/20/15 0555  NA 145 142  K 3.9 3.7  CL 103 103  CO2 33* 31  GLUCOSE 121* 114*  BUN 23* 30*  CREATININE 0.79 0.62  CALCIUM 9.1 9.1  AST 19  --   ALT 17  --   ALKPHOS 72  --   BILITOT  0.3  --     Cardiac Enzymes  Recent Labs Lab 04/18/15 0225  TROPONINI <0.03    Microbiology Results  Results for orders placed or performed during the hospital encounter of 04/18/15  MRSA PCR Screening     Status: None   Collection Time: 04/19/15 12:41 PM  Result Value Ref Range Status   MRSA by PCR NEGATIVE NEGATIVE Final    Comment:        The GeneXpert MRSA Assay (FDA approved for NASAL specimens only), is one component of a comprehensive MRSA colonization surveillance program. It is not intended to diagnose MRSA infection nor to guide or monitor treatment for MRSA infections.     RADIOLOGY:  No results found.    Follow up with PCP in 1 week.  Management plans discussed with the patient, family and they are in agreement.  CODE STATUS:   TOTAL TIME TAKING CARE OF THIS PATIENT ON DAY OF DISCHARGE: more than 30 minutes.    [name] R M.D on 04/22/2015 at 11:58 AM  Between 7am to 6pm - Pager - [phone]  After 6pm go to [email or web address] - password [email or web address] [facility]  [facility]  Office   [phone]  CC: Primary care physician; [name], PA

## 2015-04-27 ENCOUNTER — Other Ambulatory Visit: Payer: Self-pay

## 2015-04-28 ENCOUNTER — Ambulatory Visit (INDEPENDENT_AMBULATORY_CARE_PROVIDER_SITE_OTHER): Payer: Medicare PPO | Admitting: Family Medicine

## 2015-04-28 ENCOUNTER — Encounter: Payer: Self-pay | Admitting: Family Medicine

## 2015-04-28 VITALS — BP 144/94 | HR 81 | Temp 98.3°F | Resp 16 | Wt 244.6 lb

## 2015-04-28 DIAGNOSIS — J9611 Chronic respiratory failure with hypoxia: Secondary | ICD-10-CM | POA: Diagnosis not present

## 2015-04-28 DIAGNOSIS — E872 Acidosis: Secondary | ICD-10-CM

## 2015-04-28 DIAGNOSIS — M5136 Other intervertebral disc degeneration, lumbar region: Secondary | ICD-10-CM | POA: Diagnosis not present

## 2015-04-28 DIAGNOSIS — I10 Essential (primary) hypertension: Secondary | ICD-10-CM | POA: Diagnosis not present

## 2015-04-28 DIAGNOSIS — J9612 Chronic respiratory failure with hypercapnia: Secondary | ICD-10-CM

## 2015-04-28 NOTE — Progress Notes (Signed)
Subjective:    Patient ID: Theresa [name], female    DOB: 03-04-1956, 59 y.o.   MRN: [id number] Chief Complaint  Patient presents with  . Hospitalization Follow-up    HPI  This 59 year old female presented to the emergency department via EMS after collapsing outside her home 04-18-15. The patient had been sitting outside and appeared weaker and more somnolent despite increasing her oxygen via nasal cannula that she wears 24/7. She started to walk toward the house to sit in the cooler air when she collapsed. She remained conscious but required the help of 3 people to walk her into the home where she sat in a chair and immediately fell asleep. The daughter reports that she could not awaken the patient for at least 5 minutes. By the time she did wake up she was very confused. The patient's daughter states that she had been falling asleep easily for the last week. Due to her increased weakness and somnolence, her daughter increased her home O2 via nasal cannula to 4 LPM area. In the emergency department the patient was found to have a respiratory acidosis and continued somnolence which prompted the emergency physician to call the hospitalist service for admission. The patient does not remember much of the event but remembers being very sleepy. She was discharged on Levaquin 500 mg qd and Prednisone 20 mg for 4 days. Has finished these medications and feeling better except for shortness of breath. Has an appointment to follow up with pulmonologist (Dr. Raul Del) 05-01-15. Still on continuous oxygen at 4 LPM  Past Medical History  Diagnosis Date  . COPD (chronic obstructive pulmonary disease)   . Hypertension   . Gastritis   . Lumbar radiculitis   . Muscle weakness   . DDD (degenerative disc disease), cervical   . Depression   . Chronic rhinitis   . Chronic generalized abdominal pain   . Anxiety   . Edema   . Carpal tunnel syndrome   . Low back pain   . Hematuria   . Malodorous urine   . Sensory  urge incontinence   . Obesity   . Renal cyst   . Tobacco abuse    History  Substance Use Topics  . Smoking status: Current Every Day Smoker -- 2.00 packs/day for 40 years    Types: Cigarettes  . Smokeless tobacco: Not on file     Comment: 30+ years, pt states she has cut back to 4-5 cigarettes a day  . Alcohol Use: No   Family History  Problem Relation Age of Onset  . Kidney disease Mother   . Prostate cancer Neg Hx   . Kidney cancer Brother   . Lung cancer Sister   . Coronary artery disease Mother    Past Surgical History  Procedure Laterality Date  . Tubal ligation    . Abdominal hysterectomy    . Tonsillectomy     Current Outpatient Prescriptions on File Prior to Visit  Medication Sig Dispense Refill  . albuterol (PROVENTIL HFA;VENTOLIN HFA) 108 (90 BASE) MCG/ACT inhaler Inhale 2 puffs into the lungs every 6 (six) hours as needed for wheezing.    . Aspirin-Caffeine (BC FAST PAIN RELIEF ARTHRITIS) 1000-65 MG PACK Take 1 packet by mouth every 6 (six) hours as needed (Pain).    . bisoprolol-hydrochlorothiazide (ZIAC) 10-6.25 MG per tablet Take 1 tablet by mouth daily.    Marland Kitchen diltiazem (CARDIZEM) 120 MG tablet Take 120 mg by mouth daily.    Marland Kitchen estradiol (ESTRACE)  1 MG tablet Take 1 mg by mouth daily.    . Fluticasone-Salmeterol (ADVAIR) 250-50 MCG/DOSE AEPB Inhale 1 puff into the lungs every 12 (twelve) hours.    Marland Kitchen ipratropium-albuterol (DUONEB) 0.5-2.5 (3) MG/3ML SOLN Take 3 mLs by nebulization every 6 (six) hours. 360 mL 0  . roflumilast (DALIRESP) 500 MCG TABS tablet Take 500 mcg by mouth daily.    . sertraline (ZOLOFT) 100 MG tablet Take 150 mg by mouth daily.      No current facility-administered medications on file prior to visit.   Allergies  Allergen Reactions  . Codeine Nausea And Vomiting     Review of Systems  Constitutional: Positive for fatigue. Negative for fever.  HENT: Negative.   Eyes: Negative.   Respiratory: Positive for chest tightness, shortness of  breath and wheezing. Negative for cough.   Cardiovascular: Positive for palpitations.       Followed by Dr. Marcelline Deist (cardiologist) and given Diltiazem for tachycardia prn.  Gastrointestinal: Negative.   Genitourinary: Positive for urgency and frequency.       Followed by urologist.  Musculoskeletal: Positive for back pain.       Followed at a pain clinic with spinal injections (by Emmit Alexanders, MD) in the past for "bad arthritis and sciatica".  Psychiatric/Behavioral: Positive for dysphoric mood. Negative for suicidal ideas and sleep disturbance.       BP 144/94 mmHg  Pulse 81  Temp(Src) 98.3 F (36.8 C) (Oral)  Resp 16  Wt 244 lb 9.6 oz (110.95 kg)  SpO2 87%  Objective:   Physical Exam  Constitutional: She is oriented to person, place, and time. She appears distressed.  Obesity with chronic dyspnea  HENT:  Head: Normocephalic and atraumatic.  Right Ear: External ear normal.  Left Ear: External ear normal.  Nose: Nose normal.  Mouth/Throat: Oropharynx is clear and moist.  Eyes: Conjunctivae and EOM are normal. Pupils are equal, round, and reactive to light.  Neck: Normal range of motion. Neck supple.  Cardiovascular: Normal rate, regular rhythm and normal heart sounds.   Pulmonary/Chest: No respiratory distress. She has no wheezes.  Distant breath sounds with occasional rhonchi and wheeze.  Abdominal: Soft. Bowel sounds are normal.  Neurological: She is alert and oriented to person, place, and time.  Psychiatric: Her behavior is normal. Thought content normal.  Depressed affect      Assessment & Plan:  1. Chronic respiratory acidosis Discharged from the hospital 04-22-15 with respiratory acidosis secondary to acute exacerbation of COPD. Was treated with Levaquin and prednisone with oxygen at 4LPM and regular inhalation therapy (Albuterol, Duoneb. Daliresp, and Advair). Feeling improved and will follow up with pulmonologist 05-01-15.  2. Chronic respiratory failure  with hypoxia Pulse oximetry 86% while on 4LPM oxygen by nasal cannula. Uses BIPAP with oxygen bleed-in at night, also. Proceed with pulmonology recheck as planned.  3. Essential (primary) hypertension Slight elevation on the Ziac 10 mg qd. Occasionally adds Diltiazem if having fast heart beat. May need it more regularly to help control BP. Continue follow up with cardiologist (Dr. Clayborn Bigness).  4. DDD (degenerative disc disease), lumbar Still having sciatic pain primarily into the left posterior leg. Continue follow up with [facility] orthopedist.

## 2015-05-05 ENCOUNTER — Ambulatory Visit (INDEPENDENT_AMBULATORY_CARE_PROVIDER_SITE_OTHER): Payer: Medicare PPO | Admitting: Family Medicine

## 2015-05-05 ENCOUNTER — Encounter: Payer: Self-pay | Admitting: Family Medicine

## 2015-05-05 VITALS — BP 108/74 | HR 73 | Temp 98.1°F | Resp 18 | Wt 246.0 lb

## 2015-05-05 DIAGNOSIS — B029 Zoster without complications: Secondary | ICD-10-CM | POA: Diagnosis not present

## 2015-05-05 DIAGNOSIS — M545 Low back pain, unspecified: Secondary | ICD-10-CM

## 2015-05-05 MED ORDER — FAMCICLOVIR 500 MG PO TABS: 500.0000 mg | ORAL_TABLET | Freq: Three times a day (TID) | ORAL | Status: DC

## 2015-05-05 MED ORDER — HYDROCODONE-ACETAMINOPHEN 5-325 MG PO TABS: 1.0000 | ORAL_TABLET | Freq: Four times a day (QID) | ORAL | Status: DC | PRN

## 2015-05-05 NOTE — Progress Notes (Signed)
Subjective:    Patient ID: Theresa [name], female    DOB: 30-Dec-1955, 59 y.o.   MRN: [id number] Chief Complaint  Patient presents with  . Back Pain    on right lower back, started on Sunday morning, July 17th, 2016    HPI  This 59 year old female with a history of severe COPD and chronic respiratory failure developed sharp and burning pain in the right lower back 2 days ago. Daughter saw some small red spots initially. Can't tolerate clothes against the skin and most intense pain is with light touch. No known injury. Has had shingles in he past on the left leg and "this feels like the same pain".  Past Medical History  Diagnosis Date  . COPD (chronic obstructive pulmonary disease)   . Hypertension   . Gastritis   . Lumbar radiculitis   . Muscle weakness   . DDD (degenerative disc disease), cervical   . Depression   . Chronic rhinitis   . Chronic generalized abdominal pain   . Anxiety   . Edema   . Carpal tunnel syndrome   . Low back pain   . Hematuria   . Malodorous urine   . Sensory urge incontinence   . Obesity   . Renal cyst   . Tobacco abuse    Past Surgical History  Procedure Laterality Date  . Tubal ligation    . Abdominal hysterectomy    . Tonsillectomy     History  Substance Use Topics  . Smoking status: Current Every Day Smoker -- 2.00 packs/day for 40 years    Types: Cigarettes  . Smokeless tobacco: Not on file     Comment: 30+ years, pt states she has cut back to 4-5 cigarettes a day  . Alcohol Use: No   Family History  Problem Relation Age of Onset  . Kidney disease Mother   . Prostate cancer Neg Hx   . Kidney cancer Brother   . Lung cancer Sister   . Coronary artery disease Mother    Current Outpatient Prescriptions on File Prior to Visit  Medication Sig Dispense Refill  . albuterol (PROVENTIL HFA;VENTOLIN HFA) 108 (90 BASE) MCG/ACT inhaler Inhale 2 puffs into the lungs every 6 (six) hours as needed for wheezing.    . Aspirin-Caffeine (BC FAST  PAIN RELIEF ARTHRITIS) 1000-65 MG PACK Take 1 packet by mouth every 6 (six) hours as needed (Pain).    . bisoprolol-hydrochlorothiazide (ZIAC) 10-6.25 MG per tablet Take 1 tablet by mouth daily.    Marland Kitchen diltiazem (CARDIZEM) 120 MG tablet Take 120 mg by mouth daily.    Marland Kitchen estradiol (ESTRACE) 1 MG tablet Take 1 mg by mouth daily.    Marland Kitchen ipratropium-albuterol (DUONEB) 0.5-2.5 (3) MG/3ML SOLN Take 3 mLs by nebulization every 6 (six) hours. 360 mL 0  . omeprazole (PRILOSEC) 40 MG capsule TAKE 1 CAPSULE (40 MG TOTAL) BY MOUTH ONCE DAILY. TAKE 15-20 MINS BEFORE MEALS    . roflumilast (DALIRESP) 500 MCG TABS tablet Take 500 mcg by mouth daily.    . sertraline (ZOLOFT) 100 MG tablet Take 150 mg by mouth daily.     . sucralfate (CARAFATE) 1 G tablet Take 1 g by mouth 2 (two) times daily.    Marland Kitchen BREO ELLIPTA 100-25 MCG/INH AEPB     . chlorpheniramine-HYDROcodone (TUSSIONEX PENNKINETIC ER) 10-8 MG/5ML SUER Take 5 mLs by mouth every 12 (twelve) hours as needed for cough. (Patient not taking: Reported on 05/05/2015) 115 mL 0  .  fesoterodine (TOVIAZ) 8 MG TB24 tablet Take 8 mg by mouth daily.    . Fluticasone-Salmeterol (ADVAIR) 250-50 MCG/DOSE AEPB Inhale 1 puff into the lungs every 12 (twelve) hours.    . mirabegron ER (MYRBETRIQ) 50 MG TB24 tablet Take 50 mg by mouth daily.    . predniSONE (DELTASONE) 20 MG tablet 2 tabs po daily x 4 days (Patient not taking: Reported on 05/05/2015) 8 tablet 0  . tiotropium (SPIRIVA) 18 MCG inhalation capsule Place 18 mcg into inhaler and inhale daily.     No current facility-administered medications on file prior to visit.   Allergies  Allergen Reactions  . Codeine Nausea And Vomiting    Review of Systems  Constitutional: Negative.   HENT: Negative.   Respiratory: Positive for shortness of breath.        Unchanged and still on oxygen 24 hours a day.  Cardiovascular: Negative.   Gastrointestinal: Negative.   Skin: Positive for rash.       Small pink spot in the right mid  back region      BP 108/74 mmHg  Pulse 73  Temp(Src) 98.1 F (36.7 C) (Oral)  Resp 18  Wt 246 lb (111.585 kg)  SpO2 94%  Objective:   Physical Exam  Constitutional: She is oriented to person, place, and time. She appears well-developed and well-nourished. No distress.  HENT:  Head: Normocephalic and atraumatic.  Right Ear: Hearing normal.  Left Ear: Hearing normal.  Nose: Nose normal.  Eyes: Conjunctivae and lids are normal. Right eye exhibits no discharge. Left eye exhibits no discharge. No scleral icterus.  Pulmonary/Chest: She is in respiratory distress.  Chronic dyspnea and presently on oxygen by nasal cannula 24 hours a day. Essentially no changed.  Musculoskeletal: Normal range of motion.  Neurological: She is alert and oriented to person, place, and time.  Skin: Skin is intact. No lesion and no rash noted.  Very painful to palpate or lightly touch right upper lumbar and flank region. Only one pink pinpoint lesion in the area.   Psychiatric: She has a normal mood and affect. Her speech is normal and behavior is normal. Thought content normal.      Assessment & Plan:  1. Right-sided low back pain without sciatica Sudden onset 2 days ago and worsening. Light touch or clothing touching skin causes sharp burning pain. Unable to sit back in a chair or lie on her back without worsening the pain. No radiation to lower extremities. Will give analgesic and treat suspected herpes zoster. Recheck prn. - HYDROcodone-acetaminophen (NORCO/VICODIN) 5-325 MG per tablet; Take 1 tablet by mouth every 6 (six) hours as needed for moderate pain.  Dispense: 30 tablet; Refill: 0  2. Herpes zoster Similar pain in right lumbar and flank area with only one pink spot without blisters that started suddenly 2 days ago. Will treat with Famvir and follow up prn. - famciclovir (FAMVIR) 500 MG tablet; Take 1 tablet (500 mg total) by mouth 3 (three) times daily.  Dispense: 21 tablet; Refill: 0

## 2015-05-31 ENCOUNTER — Other Ambulatory Visit: Payer: Self-pay | Admitting: Family Medicine

## 2015-06-03 ENCOUNTER — Ambulatory Visit: Payer: Self-pay | Admitting: Urology

## 2015-06-03 ENCOUNTER — Encounter: Payer: Self-pay | Admitting: Urology

## 2015-06-25 ENCOUNTER — Ambulatory Visit: Payer: Medicare PPO | Admitting: Urology

## 2015-06-27 IMAGING — CR DG CHEST 2V
2 series · 2 of 2 positions shown · non-contrast
Comparison: 01/22/2015 and prior chest radiographs dating back to
07/18/2011

CLINICAL DATA: Shortness of breath.

EXAM:
CHEST  2 VIEW

[chest pa]
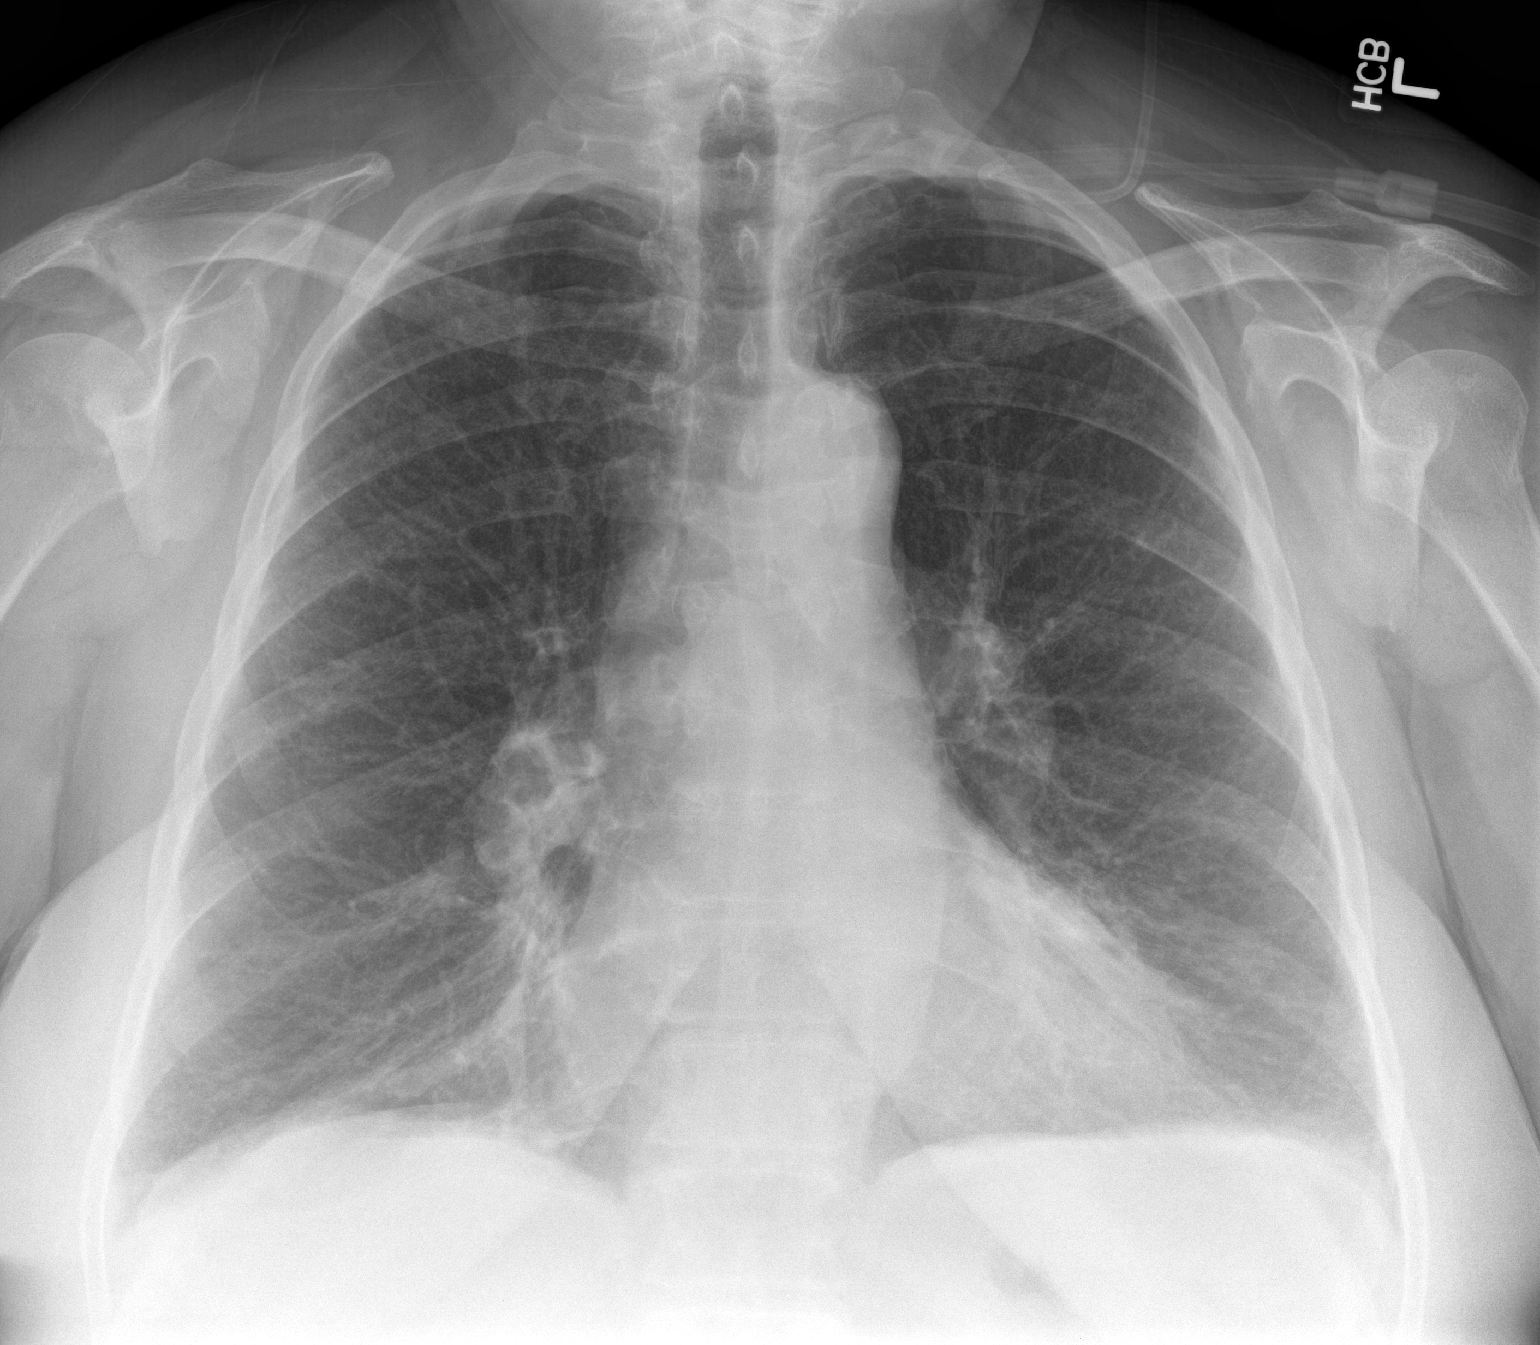

[chest lat]
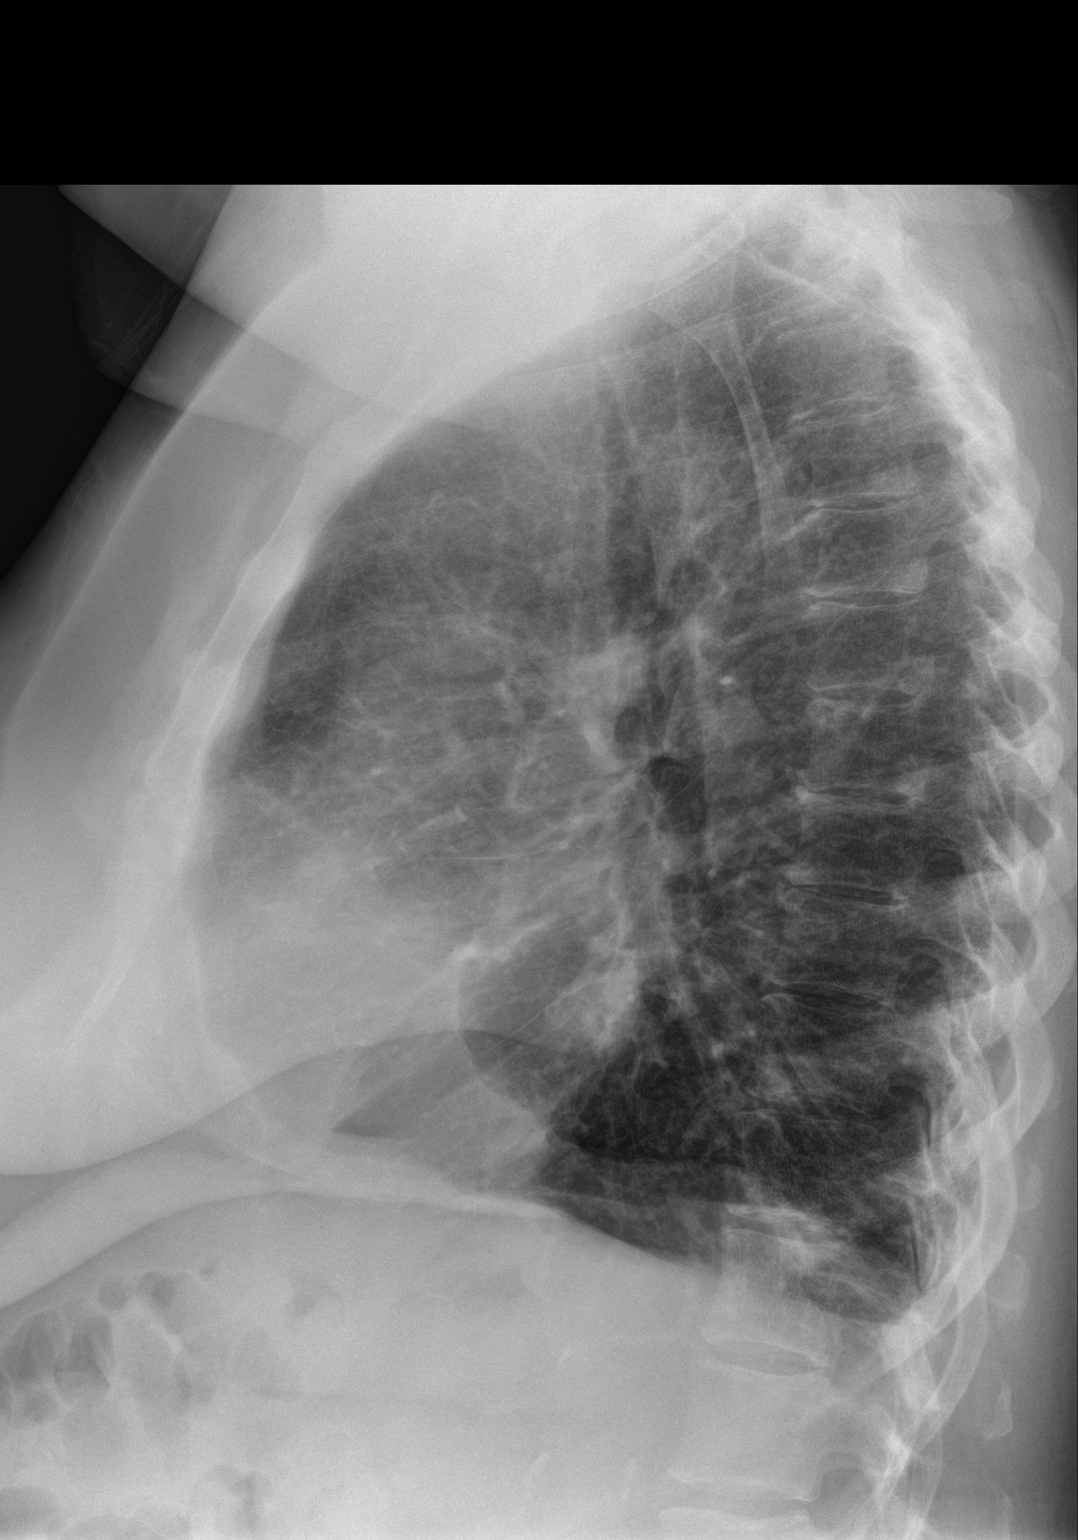

[2 of 2 positions shown; findings below may reference images not displayed]

FINDINGS: Mild cardiomegaly again noted.

COPD/emphysema changes again identified.

There is no evidence of focal airspace disease, pulmonary edema,
suspicious pulmonary nodule/mass, pleural effusion, or pneumothorax.
No acute bony abnormalities are identified.
IMPRESSION: No evidence of acute cardiopulmonary disease.

Cardiomegaly and COPD/ emphysema.

## 2015-06-29 ENCOUNTER — Emergency Department: Payer: Medicare PPO

## 2015-06-29 ENCOUNTER — Encounter: Payer: Self-pay | Admitting: Emergency Medicine

## 2015-06-29 ENCOUNTER — Encounter: Payer: Self-pay | Admitting: Urology

## 2015-06-29 ENCOUNTER — Inpatient Hospital Stay
Admission: EM | Admit: 2015-06-29 | Discharge: 2015-07-04 | DRG: 190 | Disposition: A | Discharge status: Home Health | Payer: Medicare PPO | Attending: Internal Medicine | Admitting: Internal Medicine

## 2015-06-29 ENCOUNTER — Ambulatory Visit: Payer: Medicare PPO | Admitting: Urology

## 2015-06-29 DIAGNOSIS — Z9981 Dependence on supplemental oxygen: Secondary | ICD-10-CM | POA: Diagnosis not present

## 2015-06-29 DIAGNOSIS — R1084 Generalized abdominal pain: Secondary | ICD-10-CM | POA: Diagnosis present

## 2015-06-29 DIAGNOSIS — I10 Essential (primary) hypertension: Secondary | ICD-10-CM | POA: Diagnosis present

## 2015-06-29 DIAGNOSIS — J441 Chronic obstructive pulmonary disease with (acute) exacerbation: Principal | ICD-10-CM | POA: Diagnosis present

## 2015-06-29 DIAGNOSIS — Z7982 Long term (current) use of aspirin: Secondary | ICD-10-CM

## 2015-06-29 DIAGNOSIS — Z885 Allergy status to narcotic agent status: Secondary | ICD-10-CM | POA: Diagnosis not present

## 2015-06-29 DIAGNOSIS — Z79899 Other long term (current) drug therapy: Secondary | ICD-10-CM

## 2015-06-29 DIAGNOSIS — F329 Major depressive disorder, single episode, unspecified: Secondary | ICD-10-CM | POA: Diagnosis present

## 2015-06-29 DIAGNOSIS — R0602 Shortness of breath: Secondary | ICD-10-CM | POA: Diagnosis not present

## 2015-06-29 DIAGNOSIS — Z7952 Long term (current) use of systemic steroids: Secondary | ICD-10-CM | POA: Diagnosis not present

## 2015-06-29 DIAGNOSIS — Z79891 Long term (current) use of opiate analgesic: Secondary | ICD-10-CM

## 2015-06-29 DIAGNOSIS — M1990 Unspecified osteoarthritis, unspecified site: Secondary | ICD-10-CM | POA: Diagnosis present

## 2015-06-29 DIAGNOSIS — Z6841 Body Mass Index (BMI) 40.0 and over, adult: Secondary | ICD-10-CM | POA: Diagnosis not present

## 2015-06-29 DIAGNOSIS — G8929 Other chronic pain: Secondary | ICD-10-CM | POA: Diagnosis present

## 2015-06-29 DIAGNOSIS — I5033 Acute on chronic diastolic (congestive) heart failure: Secondary | ICD-10-CM | POA: Diagnosis present

## 2015-06-29 DIAGNOSIS — M5030 Other cervical disc degeneration, unspecified cervical region: Secondary | ICD-10-CM | POA: Diagnosis present

## 2015-06-29 DIAGNOSIS — J9610 Chronic respiratory failure, unspecified whether with hypoxia or hypercapnia: Secondary | ICD-10-CM | POA: Diagnosis present

## 2015-06-29 DIAGNOSIS — E669 Obesity, unspecified: Secondary | ICD-10-CM | POA: Diagnosis present

## 2015-06-29 DIAGNOSIS — J310 Chronic rhinitis: Secondary | ICD-10-CM | POA: Diagnosis present

## 2015-06-29 DIAGNOSIS — F419 Anxiety disorder, unspecified: Secondary | ICD-10-CM | POA: Diagnosis present

## 2015-06-29 DIAGNOSIS — F17210 Nicotine dependence, cigarettes, uncomplicated: Secondary | ICD-10-CM | POA: Diagnosis present

## 2015-06-29 DIAGNOSIS — R0689 Other abnormalities of breathing: Secondary | ICD-10-CM | POA: Diagnosis present

## 2015-06-29 LAB — CBC
HCT: 44.2 % (ref 35.0–47.0)
HCT: 45.7 % (ref 35.0–47.0)
Hemoglobin: 14.7 g/dL (ref 12.0–16.0)
Hemoglobin: 14.8 g/dL (ref 12.0–16.0)
MCH: 32.3 pg (ref 26.0–34.0)
MCH: 33.7 pg (ref 26.0–34.0)
MCHC: 32.3 g/dL (ref 32.0–36.0)
MCHC: 33.5 g/dL (ref 32.0–36.0)
MCV: 100.1 fL — ABNORMAL HIGH (ref 80.0–100.0)
MCV: 100.6 fL — ABNORMAL HIGH (ref 80.0–100.0)
Platelets: 151 10*3/uL (ref 150–440)
Platelets: 151 10*3/uL (ref 150–440)
RBC: 4.39 MIL/uL (ref 3.80–5.20)
RBC: 4.56 MIL/uL (ref 3.80–5.20)
RDW: 14.4 % (ref 11.5–14.5)
RDW: 14.5 % (ref 11.5–14.5)
WBC: 10.6 10*3/uL (ref 3.6–11.0)
WBC: 8.7 10*3/uL (ref 3.6–11.0)

## 2015-06-29 LAB — COMPREHENSIVE METABOLIC PANEL
ALT: 17 U/L (ref 14–54)
AST: 19 U/L (ref 15–41)
Albumin: 3.8 g/dL (ref 3.5–5.0)
Alkaline Phosphatase: 62 U/L (ref 38–126)
Anion gap: 7 (ref 5–15)
BUN: 20 mg/dL (ref 6–20)
CO2: 30 mmol/L (ref 22–32)
Calcium: 9.4 mg/dL (ref 8.9–10.3)
Chloride: 106 mmol/L (ref 101–111)
Creatinine, Ser: 0.77 mg/dL (ref 0.44–1.00)
GFR calc Af Amer: >60 mL/min (ref >60)
GFR calc non Af Amer: >60 mL/min (ref >60)
Glucose, Bld: 118 mg/dL — ABNORMAL HIGH (ref 65–99)
Potassium: 3.5 mmol/L (ref 3.5–5.1)
Sodium: 143 mmol/L (ref 135–145)
Total Bilirubin: 0.4 mg/dL (ref 0.3–1.2)
Total Protein: 6.7 g/dL (ref 6.5–8.1)

## 2015-06-29 LAB — BASIC METABOLIC PANEL
Anion gap: 8 (ref 5–15)
BUN: 19 mg/dL (ref 6–20)
CO2: 28 mmol/L (ref 22–32)
Calcium: 9.3 mg/dL (ref 8.9–10.3)
Chloride: 106 mmol/L (ref 101–111)
Creatinine, Ser: 0.68 mg/dL (ref 0.44–1.00)
GFR calc Af Amer: >60 mL/min (ref >60)
GFR calc non Af Amer: >60 mL/min (ref >60)
Glucose, Bld: 158 mg/dL — ABNORMAL HIGH (ref 65–99)
Potassium: 4.2 mmol/L (ref 3.5–5.1)
Sodium: 142 mmol/L (ref 135–145)

## 2015-06-29 LAB — TROPONIN I
Troponin I: <0.03 ng/mL (ref <0.031)
Troponin I: <0.03 ng/mL (ref <0.031)
Troponin I: <0.03 ng/mL (ref <0.031)
Troponin I: <0.03 ng/mL (ref <0.031)

## 2015-06-29 LAB — BRAIN NATRIURETIC PEPTIDE: B Natriuretic Peptide: 120 pg/mL — ABNORMAL HIGH (ref 0.0–100.0)

## 2015-06-29 MED ORDER — CHLORHEXIDINE GLUCONATE 0.12 % MT SOLN
15.0000 mL | Freq: Two times a day (BID) | OROMUCOSAL | Status: DC
  Administered 2015-06-29 – 2015-07-04 (×7): 15 mL via OROMUCOSAL
  Filled 2015-06-29 (×6): qty 15

## 2015-06-29 MED ORDER — ONDANSETRON HCL 4 MG/2ML IJ SOLN
4.0000 mg | Freq: Four times a day (QID) | INTRAMUSCULAR | Status: DC | PRN
  Administered 2015-06-29: 4 mg via INTRAVENOUS
  Filled 2015-06-29: qty 2

## 2015-06-29 MED ORDER — BISOPROLOL FUMARATE 5 MG PO TABS
5.0000 mg | ORAL_TABLET | Freq: Every day | ORAL | Status: DC
  Administered 2015-06-29 – 2015-07-03 (×5): 5 mg via ORAL
  Filled 2015-06-29 (×5): qty 1

## 2015-06-29 MED ORDER — METHYLPREDNISOLONE SODIUM SUCC 125 MG IJ SOLR: 60.0000 mg | Freq: Three times a day (TID) | INTRAMUSCULAR | Status: DC

## 2015-06-29 MED ORDER — OXYCODONE HCL 5 MG PO TABS
5.0000 mg | ORAL_TABLET | ORAL | Status: DC | PRN
  Administered 2015-06-29 – 2015-07-02 (×9): 5 mg via ORAL
  Filled 2015-06-29 (×9): qty 1

## 2015-06-29 MED ORDER — METHYLPREDNISOLONE SODIUM SUCC 125 MG IJ SOLR
60.0000 mg | Freq: Three times a day (TID) | INTRAMUSCULAR | Status: DC
  Administered 2015-06-30 (×2): 60 mg via INTRAVENOUS
  Filled 2015-06-29 (×2): qty 2

## 2015-06-29 MED ORDER — ASPIRIN EC 81 MG PO TBEC
81.0000 mg | DELAYED_RELEASE_TABLET | Freq: Every day | ORAL | Status: DC
  Administered 2015-06-29 – 2015-07-04 (×6): 81 mg via ORAL
  Filled 2015-06-29 (×6): qty 1

## 2015-06-29 MED ORDER — FUROSEMIDE 10 MG/ML IJ SOLN
20.0000 mg | Freq: Once | INTRAMUSCULAR | Status: AC
  Administered 2015-06-29: 20 mg via INTRAVENOUS
  Filled 2015-06-29: qty 4

## 2015-06-29 MED ORDER — SODIUM CHLORIDE 0.9 % IJ SOLN
3.0000 mL | Freq: Two times a day (BID) | INTRAMUSCULAR | Status: DC
  Administered 2015-06-29 – 2015-07-04 (×12): 3 mL via INTRAVENOUS

## 2015-06-29 MED ORDER — IPRATROPIUM-ALBUTEROL 0.5-2.5 (3) MG/3ML IN SOLN
RESPIRATORY_TRACT | Status: AC
  Administered 2015-06-29: 3 mL
  Filled 2015-06-29: qty 3

## 2015-06-29 MED ORDER — ACETAMINOPHEN 650 MG RE SUPP: 650.0000 mg | Freq: Four times a day (QID) | RECTAL | Status: DC | PRN

## 2015-06-29 MED ORDER — ROFLUMILAST 500 MCG PO TABS
500.0000 ug | ORAL_TABLET | Freq: Every day | ORAL | Status: DC
  Administered 2015-06-29 – 2015-07-04 (×6): 500 ug via ORAL
  Filled 2015-06-29 (×7): qty 1

## 2015-06-29 MED ORDER — CETYLPYRIDINIUM CHLORIDE 0.05 % MT LIQD
7.0000 mL | Freq: Two times a day (BID) | OROMUCOSAL | Status: DC
  Administered 2015-06-30 – 2015-07-03 (×4): 7 mL via OROMUCOSAL

## 2015-06-29 MED ORDER — NITROGLYCERIN 0.4 MG SL SUBL: 0.4000 mg | SUBLINGUAL_TABLET | SUBLINGUAL | Status: DC | PRN

## 2015-06-29 MED ORDER — METHYLPREDNISOLONE SODIUM SUCC 125 MG IJ SOLR
125.0000 mg | Freq: Once | INTRAMUSCULAR | Status: AC
  Administered 2015-06-29: 125 mg via INTRAVENOUS
  Filled 2015-06-29: qty 2

## 2015-06-29 MED ORDER — FESOTERODINE FUMARATE ER 4 MG PO TB24
8.0000 mg | ORAL_TABLET | Freq: Every day | ORAL | Status: DC
  Administered 2015-06-29 – 2015-07-04 (×6): 8 mg via ORAL
  Filled 2015-06-29: qty 2
  Filled 2015-06-29: qty 1
  Filled 2015-06-29 (×4): qty 2

## 2015-06-29 MED ORDER — ESTRADIOL 1 MG PO TABS
1.0000 mg | ORAL_TABLET | Freq: Every day | ORAL | Status: DC
  Administered 2015-06-29 – 2015-07-04 (×6): 1 mg via ORAL
  Filled 2015-06-29 (×6): qty 1

## 2015-06-29 MED ORDER — MORPHINE SULFATE (PF) 2 MG/ML IV SOLN
2.0000 mg | INTRAVENOUS | Status: DC | PRN
  Administered 2015-06-29 – 2015-07-01 (×4): 2 mg via INTRAVENOUS
  Filled 2015-06-29 (×5): qty 1

## 2015-06-29 MED ORDER — FUROSEMIDE 10 MG/ML IJ SOLN
40.0000 mg | Freq: Every day | INTRAMUSCULAR | Status: DC
  Administered 2015-06-29 – 2015-06-30 (×2): 40 mg via INTRAVENOUS
  Filled 2015-06-29 (×2): qty 4

## 2015-06-29 MED ORDER — IPRATROPIUM-ALBUTEROL 0.5-2.5 (3) MG/3ML IN SOLN: 3.0000 mL | Freq: Four times a day (QID) | RESPIRATORY_TRACT | Status: DC

## 2015-06-29 MED ORDER — LEVOFLOXACIN IN D5W 750 MG/150ML IV SOLN
750.0000 mg | Freq: Once | INTRAVENOUS | Status: AC
  Administered 2015-06-29: 750 mg via INTRAVENOUS
  Filled 2015-06-29: qty 150

## 2015-06-29 MED ORDER — BISOPROLOL-HYDROCHLOROTHIAZIDE 5-6.25 MG PO TABS
1.0000 | ORAL_TABLET | Freq: Every day | ORAL | Status: DC
  Administered 2015-06-29 – 2015-07-03 (×5): 1 via ORAL
  Filled 2015-06-29 (×5): qty 1

## 2015-06-29 MED ORDER — ACETAMINOPHEN 325 MG PO TABS
650.0000 mg | ORAL_TABLET | Freq: Four times a day (QID) | ORAL | Status: DC | PRN
  Administered 2015-06-29 (×2): 650 mg via ORAL
  Filled 2015-06-29 (×2): qty 2

## 2015-06-29 MED ORDER — SERTRALINE HCL 50 MG PO TABS
150.0000 mg | ORAL_TABLET | Freq: Every day | ORAL | Status: DC
  Administered 2015-06-29 – 2015-07-04 (×6): 150 mg via ORAL
  Filled 2015-06-29 (×6): qty 3

## 2015-06-29 MED ORDER — BISOPROLOL-HYDROCHLOROTHIAZIDE 10-6.25 MG PO TABS: 1.0000 | ORAL_TABLET | Freq: Every day | ORAL | Status: DC

## 2015-06-29 MED ORDER — BUDESONIDE-FORMOTEROL FUMARATE 160-4.5 MCG/ACT IN AERO
2.0000 | INHALATION_SPRAY | Freq: Two times a day (BID) | RESPIRATORY_TRACT | Status: DC
  Administered 2015-06-29 – 2015-07-04 (×11): 2 via RESPIRATORY_TRACT
  Filled 2015-06-29: qty 6

## 2015-06-29 MED ORDER — ONDANSETRON HCL 4 MG PO TABS: 4.0000 mg | ORAL_TABLET | Freq: Four times a day (QID) | ORAL | Status: DC | PRN

## 2015-06-29 MED ORDER — NICOTINE 21 MG/24HR TD PT24
21.0000 mg | MEDICATED_PATCH | Freq: Every day | TRANSDERMAL | Status: DC
  Administered 2015-06-29 – 2015-07-04 (×6): 21 mg via TRANSDERMAL
  Filled 2015-06-29 (×6): qty 1

## 2015-06-29 MED ORDER — HEPARIN SODIUM (PORCINE) 5000 UNIT/ML IJ SOLN
5000.0000 [IU] | Freq: Three times a day (TID) | INTRAMUSCULAR | Status: DC
  Administered 2015-06-29 – 2015-07-04 (×16): 5000 [IU] via SUBCUTANEOUS
  Filled 2015-06-29 (×16): qty 1

## 2015-06-29 MED ORDER — IPRATROPIUM-ALBUTEROL 0.5-2.5 (3) MG/3ML IN SOLN
3.0000 mL | RESPIRATORY_TRACT | Status: DC | PRN
  Administered 2015-07-01 – 2015-07-04 (×9): 3 mL via RESPIRATORY_TRACT
  Filled 2015-06-29 (×9): qty 3

## 2015-06-29 NOTE — Progress Notes (Signed)
[place] at [place] NAME: Theresa [name]    MR#:  [id number]  DATE OF BIRTH:  Jun 06, 1956  SUBJECTIVE:  CHIEF COMPLAINT:  Patient's shortness of breath is better. Off BiPAP to get her breakfast today. Still continues to smoke  REVIEW OF SYSTEMS:  CONSTITUTIONAL: No fever, fatigue or weakness.  EYES: No blurred or double vision.  EARS, NOSE, AND THROAT: No tinnitus or ear pain.  RESPIRATORY: Reporting cough, shortness of breath, wheezing . Denies hemoptysis.  CARDIOVASCULAR: No chest pain, orthopnea, edema.  GASTROINTESTINAL: No nausea, vomiting, diarrhea or abdominal pain.  GENITOURINARY: No dysuria, hematuria.  ENDOCRINE: No polyuria, nocturia,  HEMATOLOGY: No anemia, easy bruising or bleeding SKIN: No rash or lesion. MUSCULOSKELETAL: No joint pain or arthritis.   NEUROLOGIC: No tingling, numbness, weakness.  PSYCHIATRY: No anxiety or depression.   DRUG ALLERGIES:   Allergies  Allergen Reactions  . Codeine Nausea And Vomiting    VITALS:  Blood pressure 132/75, pulse 75, temperature 97.4 F (36.3 C), temperature source Oral, resp. rate 20, height _0  (1.6 m), weight 116.711 kg (257 lb 4.8 oz), SpO2 96 %.  PHYSICAL EXAMINATION:  GENERAL:  59 y.o.-year-old patient lying in the bed with no acute distress.  EYES: Pupils equal, round, reactive to light and accommodation. No scleral icterus. Extraocular muscles intact.  HEENT: Head atraumatic, normocephalic. Oropharynx and nasopharynx clear.  NECK:  Supple, no jugular venous distention. No thyroid enlargement, no tenderness.  LUNGS: Coarse bronchial breath sounds with end expiratory  wheezing, rales,rhonchi . No use of accessory muscles of respiration.  CARDIOVASCULAR: S1, S2 normal. No murmurs, rubs, or gallops.  ABDOMEN: Soft, nontender, nondistended. Bowel sounds present. No organomegaly or mass.  EXTREMITIES: No pedal edema, cyanosis, or clubbing.  NEUROLOGIC: Cranial  nerves II through XII are intact. Muscle strength 5/5 in all extremities. Sensation intact. Gait not checked.  PSYCHIATRIC: The patient is alert and oriented x 3. Anxious SKIN: No obvious rash, lesion, or ulcer.    LABORATORY PANEL:   CBC  Recent Labs Lab 06/29/15 0706  WBC 8.7  HGB 14.7  HCT 45.7  PLT 151   ------------------------------------------------------------------------------------------------------------------  Chemistries   Recent Labs Lab 06/29/15 0039 06/29/15 0706  NA 143 142  K 3.5 4.2  CL 106 106  CO2 30 28  GLUCOSE 118* 158*  BUN 20 19  CREATININE 0.77 0.68  CALCIUM 9.4 9.3  AST 19  --   ALT 17  --   ALKPHOS 62  --   BILITOT 0.4  --    ------------------------------------------------------------------------------------------------------------------  Cardiac Enzymes  Recent Labs Lab 06/29/15 1314  TROPONINI <0.03   ------------------------------------------------------------------------------------------------------------------  RADIOLOGY:  Dg Chest 1 View  06/29/2015   CLINICAL DATA:  Shortness of breath all day, worsened over the last 4 hours.  EXAM: CHEST  1 VIEW  COMPARISON:  04/18/2015, 03/01/2015  FINDINGS: Heart is mildly enlarged. Ill-defined perivascular haziness and interstitial thickening. No confluent airspace disease. The lungs are hyperinflated. No large pleural effusion or pneumothorax. Osseous structures are intact.  IMPRESSION: Perivascular haziness and interstitial thickening, suggestive of pulmonary edema. Heart is mildly enlarged, may be accentuated by technique. Recommend correlation for CHF.   Electronically Signed   By: Jeb Levering M.D.   On: 06/29/2015 02:30    EKG:   Orders placed or performed during the hospital encounter of 06/29/15  . EKG 12-Lead  . EKG 12-Lead  . ED EKG  . ED EKG    ASSESSMENT AND  PLAN:   59 year old Caucasian female history of COPD and non-oxygen dependent presenting with shortness of  breath  1. Acute on chronic diastolic congestive heart failure: Last ejection fraction noted to be 55% in May of this year Continue on telemetry Continue aspirin therapy ,diuresis with IV Lasix, daily weights, I's and O's, Appreciate  cardiology recommendations  2. COPD acute exacerbation: Wean off BiPAP as tolerated  DuoNeb treatments supplemental oxygen  IV steroids and antibiotics  3. Essential hypertension: Continue home medications  4. History of depression and anxiety-resume her home medications Zoloft 150 mg once a day  5. Tobacco abuse disorder Counseled patient to quit smoking for 3-5 minutes Nicotine patch 21 mg once daily Patient is planning to quit smoking if insurance can cover her nicotine gum and patches  4. Venous thromboembolism prophylactic: Heparin subcutaneous       All the records are reviewed and case discussed with Care Management/Social Workerr. Management plans discussed with the patient, family and they are in agreement.  CODE STATUS: Full code  TOTAL TIME TAKING CARE OF THIS PATIENT: 35 minutes.   POSSIBLE D/C IN 2-3 DAYS, DEPENDING ON CLINICAL CONDITION.   Nicholes Mango M.D on 06/29/2015 at 3:48 PM  Between 7am to 6pm - Pager - [phone] After 6pm go to [email or web address] - password [email or web address]  [facility]  Office  [phone]  CC: Primary care physician; [name], PA

## 2015-06-29 NOTE — Plan of Care (Signed)
Problem: Consults Goal: Tobacco Cessation referral if indicated Outcome: Progressing Handouts given

## 2015-06-29 NOTE — Progress Notes (Signed)
Bipap off, pt on 3LO2 per [place] at this time, tolerating well.

## 2015-06-29 NOTE — Care Management (Signed)
Patient presents from home with acute on chronic respiratory failure due to exac of COPD.  She currently has a trilogy, continuous home 02 and home neb through [place].  At present, she is having difficulty ambulating and performing her adls due to dyspnea.  Her husband Judith Blonder and is not always in the home.  Patient is agreeable to home health services at discharge.  She may benefit from nursing to monitor respiratory status, PT and OT for ambulation and energy conservation and Aide. She will consider [facility] referral

## 2015-06-29 NOTE — H&P (Signed)
[place] at [place] NAME: Theresa [name]    MR#:  [id number]  DATE OF BIRTH:  01-30-1956   DATE OF ADMISSION:  06/29/2015  PRIMARY CARE PHYSICIAN: [name], PA   REQUESTING/REFERRING PHYSICIAN: Edd Fabian  CHIEF COMPLAINT:   Chief Complaint  Patient presents with  . Shortness of Breath    HISTORY OF PRESENT ILLNESS:  Theresa [name]  is a 59 y.o. female with a known history of COPD and non-auction dependent, obstructive sleep apnea requiring BiPAP at night presenting with shortness of breath. She describes 2 day duration shortness of breath mainly dyspnea on exertion with associated nonproductive cough. She denies any fevers chills. Positive worsening lower extremity edema from baseline, positive orthopnea. In the emergency department she was noted be in distress requiring BiPAP therapy she has currently improved on BiPAP FiO2 30%  PAST MEDICAL HISTORY:   Past Medical History  Diagnosis Date  . COPD (chronic obstructive pulmonary disease)   . Hypertension   . Gastritis   . Lumbar radiculitis   . Muscle weakness   . DDD (degenerative disc disease), cervical   . Depression   . Chronic rhinitis   . Chronic generalized abdominal pain   . Anxiety   . Edema   . Carpal tunnel syndrome   . Low back pain   . Hematuria   . Malodorous urine   . Sensory urge incontinence   . Obesity   . Renal cyst   . Tobacco abuse     PAST SURGICAL HISTORY:   Past Surgical History  Procedure Laterality Date  . Tubal ligation    . Abdominal hysterectomy    . Tonsillectomy      SOCIAL HISTORY:   Social History  Substance Use Topics  . Smoking status: Current Every Day Smoker -- 1.00 packs/day for 40 years    Types: Cigarettes  . Smokeless tobacco: Never Used     Comment: 30+ years, pt states she has cut back to 4-5 cigarettes a day  . Alcohol Use: No    FAMILY HISTORY:   Family History  Problem Relation Age of Onset  .  Kidney disease Mother   . Prostate cancer Neg Hx   . Kidney cancer Brother   . Lung cancer Sister   . Coronary artery disease Mother     DRUG ALLERGIES:   Allergies  Allergen Reactions  . Codeine Nausea And Vomiting    REVIEW OF SYSTEMS:  REVIEW OF SYSTEMS:  CONSTITUTIONAL: Denies fevers, chills, fatigue, weakness.  EYES: Denies blurred vision, double vision, or eye pain.  EARS, NOSE, THROAT: Denies tinnitus, ear pain, hearing loss.  RESPIRATORY: Positive cough, shortness of breath, denies wheezing  CARDIOVASCULAR: Denies chest pain, palpitations, positive edema.  GASTROINTESTINAL: Denies nausea, vomiting, diarrhea, abdominal pain.  GENITOURINARY: Denies dysuria, hematuria.  ENDOCRINE: Denies nocturia or thyroid problems. HEMATOLOGIC AND LYMPHATIC: Denies easy bruising or bleeding.  SKIN: Denies rash or lesions.  MUSCULOSKELETAL: Denies pain in neck, back, shoulder, knees, hips, or further arthritic symptoms.  NEUROLOGIC: Denies paralysis, paresthesias.  PSYCHIATRIC: Denies anxiety or depressive symptoms. Otherwise full review of systems performed by me is negative.   MEDICATIONS AT HOME:   Prior to Admission medications   Medication Sig Start Date End Date Taking? Authorizing Provider  albuterol (PROVENTIL HFA;VENTOLIN HFA) 108 (90 BASE) MCG/ACT inhaler Inhale 2 puffs into the lungs every 6 (six) hours as needed for wheezing.   Yes Historical Provider, MD  Aspirin-Caffeine [facility] FAST  PAIN RELIEF ARTHRITIS) 1000-65 MG PACK Take 1 packet by mouth every 6 (six) hours as needed (Pain).   Yes Historical Provider, MD  bisoprolol-hydrochlorothiazide [facility]) 10-6.25 MG per tablet TAKE 1 TABLET BY MOUTH DAILY 05/31/15  Yes Vickki Muff Chrismon, PA  budesonide-formoterol (SYMBICORT) 160-4.5 MCG/ACT inhaler Inhale 2 puffs into the lungs 2 (two) times daily.   Yes Historical Provider, MD  estradiol (ESTRACE) 1 MG tablet Take 1 mg by mouth daily.   Yes Historical Provider, MD  fesoterodine  (TOVIAZ) 8 MG TB24 tablet Take 8 mg by mouth daily.   Yes Historical Provider, MD  ipratropium-albuterol (DUONEB) 0.5-2.5 (3) MG/3ML SOLN Take 3 mLs by nebulization every 6 (six) hours. 04/21/15  Yes Srikar Sudini, MD  NON FORMULARY Apply 1 application topically 4 (four) times daily as needed (joint/ muscle pain). Perform Cool & Soothing Pain Relieving Roll-On (Menthol 3.5% topical)   Yes Historical Provider, MD  roflumilast (DALIRESP) 500 MCG TABS tablet Take 500 mcg by mouth daily.   Yes Historical Provider, MD  chlorpheniramine-HYDROcodone (TUSSIONEX PENNKINETIC ER) 10-8 MG/5ML SUER Take 5 mLs by mouth every 12 (twelve) hours as needed for cough. Patient not taking: Reported on 05/05/2015 03/01/15   Frederich Cha, MD  famciclovir [facility]) 500 MG tablet Take 1 tablet (500 mg total) by mouth 3 (three) times daily. Patient not taking: Reported on 06/29/2015 05/05/15   Vickki Muff Chrismon, PA  HYDROcodone-acetaminophen (NORCO/VICODIN) 5-325 MG per tablet Take 1 tablet by mouth every 6 (six) hours as needed for moderate pain. Patient not taking: Reported on 06/29/2015 05/05/15   Vickki Muff Chrismon, PA  predniSONE (DELTASONE) 20 MG tablet 2 tabs po daily x 4 days Patient not taking: Reported on 05/05/2015 04/21/15   [name], MD      VITAL SIGNS:  Blood pressure 108/82, pulse 70, temperature 97.5 F (36.4 C), temperature source Oral, resp. rate 19, height _0  (1.6 m), weight 251 lb (113.853 kg), SpO2 96 %.  PHYSICAL EXAMINATION:  VITAL SIGNS: Filed Vitals:   06/29/15 0230  BP: 108/82  Pulse: 70  Temp:   Resp: 84   GENERAL:59 y.o.female currently in mild acute distress. Given respiratory status HEAD: Normocephalic, atraumatic.  EYES: Pupils equal, round, reactive to light. Extraocular muscles intact. No scleral icterus.  MOUTH: Moist mucosal membrane. Dentition intact. No abscess noted.  EAR, NOSE, THROAT: Clear without exudates. No external lesions.  NECK: Supple. No thyromegaly. No nodules. No  JVD.  PULMONARY: Some basilar crackles most prominent on right, without wheeze. No use of accessory muscles though currently requiring BiPAP therapy, Good respiratory effort. good air entry bilaterally CHEST: Nontender to palpation.  CARDIOVASCULAR: S1 and S2. Regular rate and rhythm. No murmurs, rubs, or gallops. 2+ edema to knee. Pedal pulses 2+ bilaterally.  GASTROINTESTINAL: Soft, nontender, nondistended. No masses. Positive bowel sounds. No hepatosplenomegaly.  MUSCULOSKELETAL: No swelling, clubbing, or edema. Range of motion full in all extremities.  NEUROLOGIC: Cranial nerves II through XII are intact. No gross focal neurological deficits. Sensation intact. Reflexes intact.  SKIN: No ulceration, lesions, rashes, or cyanosis. Skin warm and dry. Turgor intact.  PSYCHIATRIC: Mood, affect within normal limits. The patient is awake, alert and oriented x 3. Insight, judgment intact.    LABORATORY PANEL:   CBC  Recent Labs Lab 06/29/15 0039  WBC 10.6  HGB 14.8  HCT 44.2  PLT 151   ------------------------------------------------------------------------------------------------------------------  Chemistries   Recent Labs Lab 06/29/15 0039  NA 143  K 3.5  CL 106  CO2  30  GLUCOSE 118*  BUN 20  CREATININE 0.77  CALCIUM 9.4  AST 19  ALT 17  ALKPHOS 62  BILITOT 0.4   ------------------------------------------------------------------------------------------------------------------  Cardiac Enzymes  Recent Labs Lab 06/29/15 0039  TROPONINI <0.03   ------------------------------------------------------------------------------------------------------------------  RADIOLOGY:  Dg Chest 1 View  06/29/2015   CLINICAL DATA:  Shortness of breath all day, worsened over the last 4 hours.  EXAM: CHEST  1 VIEW  COMPARISON:  04/18/2015, 03/01/2015  FINDINGS: Heart is mildly enlarged. Ill-defined perivascular haziness and interstitial thickening. No confluent airspace disease. The  lungs are hyperinflated. No large pleural effusion or pneumothorax. Osseous structures are intact.  IMPRESSION: Perivascular haziness and interstitial thickening, suggestive of pulmonary edema. Heart is mildly enlarged, may be accentuated by technique. Recommend correlation for CHF.   Electronically Signed   By: Jeb Levering M.D.   On: 06/29/2015 02:30    EKG:   Orders placed or performed during the hospital encounter of 06/29/15  . EKG 12-Lead  . EKG 12-Lead  . ED EKG  . ED EKG    IMPRESSION AND PLAN:   59 year old Caucasian female history of COPD and non-oxygen dependent presenting with shortness of breath  1. Acute on chronic diastolic congestive heart failure: Last ejection fraction noted to be 55% in May of this year, place on telemetry, initiate aspirin therapy, cardiac enzymes 3, continue diuresis with IV Lasix, trend troponin, daily weights, I's and O's, consult cardiology follows with Dr. Clayborn Bigness 2. COPD does not appear to be in acute exacerbation: DuoNeb treatments supplemental oxygen continue home medications 3. Essential hypertension: Continue home medications 4. Venous thromboembolism prophylactic: Heparin subcutaneous    All the records are reviewed and case discussed with ED provider. Management plans discussed with the patient, family and they are in agreement.  CODE STATUS: Full  TOTAL TIME TAKING CARE OF THIS PATIENT: 35 minutes.    Hower,  Karenann Cai.D on 06/29/2015 at 3:17 AM  Between 7am to 6pm - Pager - [phone]  After 6pm: House Pager: - [phone]  [facility]  Office  [phone]  CC: Primary care physician; [name], PA

## 2015-06-29 NOTE — Progress Notes (Signed)
Zofran 42m iv given for pt complaint of intermittent nausea.  Will monitor.

## 2015-06-29 NOTE — Consult Note (Signed)
[facility] Cardiology  CARDIOLOGY CONSULT NOTE  Patient ID: Theresa [name] MRN: [id number] DOB/AGE: [birth year] 58 y.o.  Admit date: 06/29/2015 Referring Physician [place] Primary Physician [name] PA Primary Cardiologist Lujean Amel M.D. Reason for Consultation congestive heart failure  HPI: 59 year old female with known history of COPD and chronic diastolic congestive heart failure referred for evaluation of acute on chronic diastolic congestive heart failure. She presents to [facility] emergency room with one-week history of fluid retention, 2 day history of increasing exertional dyspnea, with nonproductive cough. Patient was noted to have peripheral edema, in respiratory distress, requiring BiPAP therapy. EKG was unremarkable. Admission labs were notable for negative troponin. Patient was treated with intravenous furosemide, DuoNeb treatments with clinical improvement overnight. The patient and his cardiac catheterization roughly one year ago which revealed insignificant coronary artery disease. Lexiscan sestamibi study for/18/16 did not reveal evidence for ischemia. 2-D echocardiogram I/5/16 revealed normal left ventricular function with LVEF of 50-55%.  Review of systems complete and found to be negative unless listed above     Past Medical History  Diagnosis Date  . COPD (chronic obstructive pulmonary disease)   . Hypertension   . Gastritis   . Lumbar radiculitis   . Muscle weakness   . DDD (degenerative disc disease), cervical   . Depression   . Chronic rhinitis   . Chronic generalized abdominal pain   . Anxiety   . Edema   . Carpal tunnel syndrome   . Low back pain   . Hematuria   . Malodorous urine   . Sensory urge incontinence   . Obesity   . Renal cyst   . Tobacco abuse     Past Surgical History  Procedure Laterality Date  . Tubal ligation    . Abdominal hysterectomy    . Tonsillectomy      Prescriptions prior to admission  Medication Sig Dispense Refill Last Dose   . albuterol (PROVENTIL HFA;VENTOLIN HFA) 108 (90 BASE) MCG/ACT inhaler Inhale 2 puffs into the lungs every 6 (six) hours as needed for wheezing.   prn  . Aspirin-Caffeine (BC FAST PAIN RELIEF ARTHRITIS) 1000-65 MG PACK Take 1 packet by mouth every 6 (six) hours as needed (Pain).   prn  . bisoprolol-hydrochlorothiazide (ZIAC) 10-6.25 MG per tablet TAKE 1 TABLET BY MOUTH DAILY 90 tablet 1 06/28/2015 at Unknown time  . budesonide-formoterol (SYMBICORT) 160-4.5 MCG/ACT inhaler Inhale 2 puffs into the lungs 2 (two) times daily.   06/28/2015 at Unknown time  . estradiol (ESTRACE) 1 MG tablet Take 1 mg by mouth daily.   06/28/2015 at Unknown time  . fesoterodine (TOVIAZ) 8 MG TB24 tablet Take 8 mg by mouth daily.   06/28/2015 at Unknown time  . ipratropium-albuterol (DUONEB) 0.5-2.5 (3) MG/3ML SOLN Take 3 mLs by nebulization every 6 (six) hours. 360 mL 0 06/28/2015 at Unknown time  . NON FORMULARY Apply 1 application topically 4 (four) times daily as needed (joint/ muscle pain). Perform Cool & Soothing Pain Relieving Roll-On (Menthol 3.5% topical)   prn  . roflumilast (DALIRESP) 500 MCG TABS tablet Take 500 mcg by mouth daily.   06/28/2015 at Unknown time  . chlorpheniramine-HYDROcodone (TUSSIONEX PENNKINETIC ER) 10-8 MG/5ML SUER Take 5 mLs by mouth every 12 (twelve) hours as needed for cough. (Patient not taking: Reported on 05/05/2015) 115 mL 0 Not Taking  . famciclovir (FAMVIR) 500 MG tablet Take 1 tablet (500 mg total) by mouth 3 (three) times daily. (Patient not taking: Reported on 06/29/2015) 21 tablet 0 Completed Course at  Unknown time  . HYDROcodone-acetaminophen (NORCO/VICODIN) 5-325 MG per tablet Take 1 tablet by mouth every 6 (six) hours as needed for moderate pain. (Patient not taking: Reported on 06/29/2015) 30 tablet 0 Not Taking at Unknown time  . predniSONE (DELTASONE) 20 MG tablet 2 tabs po daily x 4 days (Patient not taking: Reported on 05/05/2015) 8 tablet 0 Not Taking   Social History   Social  History  . Marital Status: Married    Spouse Name: N/A  . Number of Children: N/A  . Years of Education: N/A   Occupational History  . Not on file.   Social History Main Topics  . Smoking status: Current Every Day Smoker -- 1.00 packs/day for 40 years    Types: Cigarettes  . Smokeless tobacco: Never Used     Comment: 30+ years, pt states she has cut back to 4-5 cigarettes a day  . Alcohol Use: No  . Drug Use: No  . Sexual Activity: No   Other Topics Concern  . Not on file   Social History Narrative   Lives with her daughter    Family History  Problem Relation Age of Onset  . Kidney disease Mother   . Prostate cancer Neg Hx   . Kidney cancer Brother   . Lung cancer Sister   . Coronary artery disease Mother       Review of systems complete and found to be negative unless listed above      PHYSICAL EXAM  General: Well developed, well nourished, in no acute distress HEENT:  Normocephalic and atramatic Neck:  No JVD.  Lungs: Clear bilaterally to auscultation and percussion. Heart: HRRR . Normal S1 and S2 without gallops or murmurs.  Abdomen: Bowel sounds are positive, abdomen soft and non-tender  Msk:  Back normal, normal gait. Normal strength and tone for age. Extremities: No clubbing, cyanosis or edema.   Neuro: Alert and oriented X 3. Psych:  Good affect, responds appropriately  Labs:   Lab Results  Component Value Date   WBC 8.7 06/29/2015   HGB 14.7 06/29/2015   HCT 45.7 06/29/2015   MCV 100.1* 06/29/2015   PLT 151 06/29/2015    Recent Labs Lab 06/29/15 0039 06/29/15 0706  NA 143 142  K 3.5 4.2  CL 106 106  CO2 30 28  BUN 20 19  CREATININE 0.77 0.68  CALCIUM 9.4 9.3  PROT 6.7  --   BILITOT 0.4  --   ALKPHOS 62  --   ALT 17  --   AST 19  --   GLUCOSE 118* 158*   Lab Results  Component Value Date   TROPONINI <0.03 06/29/2015   No results found for: CHOL No results found for: HDL No results found for: LDLCALC No results found for:  TRIG No results found for: CHOLHDL No results found for: LDLDIRECT    Radiology: Dg Chest 1 View  06/29/2015   CLINICAL DATA:  Shortness of breath all day, worsened over the last 4 hours.  EXAM: CHEST  1 VIEW  COMPARISON:  04/18/2015, 03/01/2015  FINDINGS: Heart is mildly enlarged. Ill-defined perivascular haziness and interstitial thickening. No confluent airspace disease. The lungs are hyperinflated. No large pleural effusion or pneumothorax. Osseous structures are intact.  IMPRESSION: Perivascular haziness and interstitial thickening, suggestive of pulmonary edema. Heart is mildly enlarged, may be accentuated by technique. Recommend correlation for CHF.   Electronically Signed   By: Jeb Levering M.D.   On: 06/29/2015 02:30  EKG: Normal sinus rhythm  ASSESSMENT AND PLAN:   1. Acute on chronic diastolic congestive heart failure, with known insignificant coronary disease and normal left ventricular function, unremarkable ECG, negative troponin with improvement after initial diuresis 2. Respiratory failure, multifactorial, secondary to COPD exacerbation and acute on chronic diastolic congestive heart failure  Recommendations  1. Continue current medical therapy 2. Defer full dose anticoagulation 3. Continue diuresis 4. Defer further cardiac diagnostics at this time 5. Strongly encouraged patient to stop smoking   Signed: Shamus Desantis MD,PhD, [facility] 06/29/2015, 9:00 AM

## 2015-06-29 NOTE — ED Notes (Signed)
Pt updated on treatment plan. Pt verbalizes understanding.

## 2015-06-29 NOTE — ED Notes (Signed)
Pt assisted up to void without difficulty.

## 2015-06-29 NOTE — ED Notes (Signed)
Pt with shortness of breath from home. Pt on own bipap with portable phillips bipap. Pt states has been shob increasing since 2100 yesterday. Pt with dry cough noted. Pt with 2+ edema noted to bilateral feet and ankles, tibial areas. Skin normal color warm and dry. Pt denies fever. Pt took 2 duoneb treatments at home, ems gave pt 140m of iv solumedrol.

## 2015-06-29 NOTE — Progress Notes (Signed)
Called Respiratory Therapy to come assess pts ability to come off of her bipap

## 2015-06-29 NOTE — Progress Notes (Signed)
States her nausea is better

## 2015-06-29 NOTE — ED Provider Notes (Signed)
[facility] Emergency Department Provider Note  ____________________________________________  Time seen: Approximately 1:05 AM  I have reviewed the triage vital signs and the nursing notes.   HISTORY  Chief Complaint Shortness of Breath    HPI Theresa [name] is a 59 y.o. female with history of COPD with home 2-3 L oxygen requirement, hypertension presents for evaluation of gradual onset shortness of breath which began 4-5 days ago and has worsened tonight. Shortness of breath has been intermittent. It typically improves with nebulizer treatments. Tonight she gave herself 2 DuoNeb treatments and put herself on her home BiPAP machine which she wears nightly. She called EMS and on their arrival she appeared more comfortable but continued to wheeze. She is having chest tightness, no fevers. No vomiting diarrhea or chills. Current severity of symptoms is moderate to severe.   Past Medical History  Diagnosis Date  . COPD (chronic obstructive pulmonary disease)   . Hypertension   . Gastritis   . Lumbar radiculitis   . Muscle weakness   . DDD (degenerative disc disease), cervical   . Depression   . Chronic rhinitis   . Chronic generalized abdominal pain   . Anxiety   . Edema   . Carpal tunnel syndrome   . Low back pain   . Hematuria   . Malodorous urine   . Sensory urge incontinence   . Obesity   . Renal cyst   . Tobacco abuse     Patient Active Problem List   Diagnosis Date Noted  . Chronic respiratory failure with hypoxia 04/28/2015  . Tendinitis 04/21/2015  . BP (high blood pressure) 04/21/2015  . Blood clotting disorder 04/21/2015  . Chronic respiratory acidosis 04/18/2015  . Abdominal pain, periumbilical 15/83/0940  . Abdominal pain, generalized 07/17/2014  . Hypoventilation, idiopathic 07/16/2014  . Decreased motor strength 07/16/2014  . Accumulation of fluid in tissues 07/16/2014  . Soft tissue lesion of shoulder region 07/16/2014  .  Clinical depression 07/16/2014  . Chronic rhinitis 07/16/2014  . Carpal tunnel syndrome 07/16/2014  . Anxiety state 07/16/2014  . Neuritis or radiculitis due to rupture of lumbar intervertebral disc 07/02/2014  . DDD (degenerative disc disease), lumbar 07/02/2014  . Adiposity 02/27/2014  . Nicotine addiction 02/27/2014  . Abnormal result of cardiovascular function study 02/27/2014  . CAFL (chronic airflow limitation) 02/27/2014  . Herpes zona 07/16/2009  . Essential (primary) hypertension 03/27/2008    Past Surgical History  Procedure Laterality Date  . Tubal ligation    . Abdominal hysterectomy    . Tonsillectomy      Current Outpatient Rx  Name  Route  Sig  Dispense  Refill  . albuterol (PROVENTIL HFA;VENTOLIN HFA) 108 (90 BASE) MCG/ACT inhaler   Inhalation   Inhale 2 puffs into the lungs every 6 (six) hours as needed for wheezing.         . Aspirin-Caffeine (BC FAST PAIN RELIEF ARTHRITIS) 1000-65 MG PACK   Oral   Take 1 packet by mouth every 6 (six) hours as needed (Pain).         . bisoprolol-hydrochlorothiazide (ZIAC) 10-6.25 MG per tablet      TAKE 1 TABLET BY MOUTH DAILY   90 tablet   1   . chlorpheniramine-HYDROcodone (TUSSIONEX PENNKINETIC ER) 10-8 MG/5ML SUER   Oral   Take 5 mLs by mouth every 12 (twelve) hours as needed for cough. Patient not taking: Reported on 05/05/2015   115 mL   0   . estradiol (ESTRACE) 1  MG tablet   Oral   Take 1 mg by mouth daily.         . famciclovir (FAMVIR) 500 MG tablet   Oral   Take 1 tablet (500 mg total) by mouth 3 (three) times daily.   21 tablet   0   . fesoterodine (TOVIAZ) 8 MG TB24 tablet   Oral   Take 8 mg by mouth daily.         Marland Kitchen HYDROcodone-acetaminophen (NORCO/VICODIN) 5-325 MG per tablet   Oral   Take 1 tablet by mouth every 6 (six) hours as needed for moderate pain.   30 tablet   0   . ipratropium-albuterol (DUONEB) 0.5-2.5 (3) MG/3ML SOLN   Nebulization   Take 3 mLs by nebulization every  6 (six) hours.   360 mL   0   . predniSONE (DELTASONE) 20 MG tablet      2 tabs po daily x 4 days Patient not taking: Reported on 05/05/2015   8 tablet   0   . roflumilast (DALIRESP) 500 MCG TABS tablet   Oral   Take 500 mcg by mouth daily.           Allergies Codeine  Family History  Problem Relation Age of Onset  . Kidney disease Mother   . Prostate cancer Neg Hx   . Kidney cancer Brother   . Lung cancer Sister   . Coronary artery disease Mother     Social History Social History  Substance Use Topics  . Smoking status: Current Every Day Smoker -- 1.00 packs/day for 40 years    Types: Cigarettes  . Smokeless tobacco: Never Used     Comment: 30+ years, pt states she has cut back to 4-5 cigarettes a day  . Alcohol Use: No    Review of Systems Constitutional: No fever/chills Eyes: No visual changes. ENT: No sore throat. Cardiovascular: + chest pain. Respiratory: + shortness of breath. Gastrointestinal: No abdominal pain.  No nausea, no vomiting.  No diarrhea.  No constipation. Genitourinary: Negative for dysuria. Musculoskeletal: Negative for back pain. Skin: Negative for rash. Neurological: Negative for headaches, focal weakness or numbness.  10-point ROS otherwise negative.  ____________________________________________   PHYSICAL EXAM:  VITAL SIGNS: ED Triage Vitals  Enc Vitals Group     BP 06/29/15 0039 140/83 mmHg     Pulse Rate 06/29/15 0039 79     Resp 06/29/15 0039 24     Temp 06/29/15 0039 97.5 F (36.4 C)     Temp Source 06/29/15 0039 Oral     SpO2 06/29/15 0039 98 %     Weight 06/29/15 0039 251 lb (113.853 kg)     Height 06/29/15 0039 _0  (1.6 m)     Head Cir --      Peak Flow --      Pain Score 06/29/15 0042 4     Pain Loc --      Pain Edu? --      Excl. in [place]? --     Constitutional: Alert and oriented. In mild respiratory distress but able to speak over the BiPAP. Eyes: Conjunctivae are normal. PERRL. EOMI. Head:  Atraumatic. Nose: No congestion/rhinnorhea. Mouth/Throat: Mucous membranes are moist.  Oropharynx non-erythematous. Neck: No stridor.   Cardiovascular: Normal rate, regular rhythm. Grossly normal heart sounds.  Good peripheral circulation. Respiratory: Moderate tachypnea, mildly increased work of breathing, diffuse expiratory wheeze. Gastrointestinal: Soft and nontender. No distention. No abdominal bruits. No CVA tenderness. Genitourinary: deferred Musculoskeletal:  No lower extremity tenderness, 1+ edema bilateral lower extremities. No joint effusions. Neurologic:  Normal speech and language. No gross focal neurologic deficits are appreciated. No gait instability. Skin:  Skin is warm, dry and intact. No rash noted. Psychiatric: Mood and affect are normal. Speech and behavior are normal.  ____________________________________________   LABS (all labs ordered are listed, but only abnormal results are displayed)  Labs Reviewed  CBC - Abnormal; Notable for the following:    MCV 100.6 (*)    All other components within normal limits  CULTURE, BLOOD (ROUTINE X 2)  CULTURE, BLOOD (ROUTINE X 2)  COMPREHENSIVE METABOLIC PANEL  TROPONIN I  BRAIN NATRIURETIC PEPTIDE   ____________________________________________  EKG  ED ECG REPORT I, Joanne Gavel, the attending physician, personally viewed and interpreted this ECG.   Date: 06/29/2015  EKG Time: 00:37  Rate: 74  Rhythm: normal sinus rhythm  Axis: normal  Intervals:none  ST&T Change: 1 mm discordant ST elevation in V2 but no other leads. No STEMI  ____________________________________________  RADIOLOGY  CXR IMPRESSION: Perivascular haziness and interstitial thickening, suggestive of pulmonary edema. Heart is mildly enlarged, may be accentuated by technique. Recommend correlation for CHF.  ____________________________________________   PROCEDURES  Procedure(s) performed: None  Critical Care performed: Yes, see critical  care note(s). Total critical care time spent 30 minutes.  ____________________________________________   INITIAL IMPRESSION / ASSESSMENT AND PLAN / ED COURSE  Pertinent labs & imaging results that were available during my care of the patient were reviewed by me and considered in my medical decision making (see chart for details).  Arrington [name] is a 59 y.o. female with history of COPD with home 2-3 L oxygen requirement, hypertension presents for evaluation of gradual onset shortness of breath. On arrival to the emergency department, she appears quite comfortable on BiPAP, satting 99%. She does have diffuse expiratory wheeze. Suspect COPD exacerbation. We'll continue with DuoNeb's, she received 125 mg IV Metro prior to arrival to the emergency department. We'll give Levaquin. Awaiting labs, chest x-ray. Anticipate admission.  ----------------------------------------- 2:57 AM on 06/29/2015 -----------------------------------------  She continues to improve on BiPAP. Labs reviewed. Normal CMP BC, normal CMP, negative troponin. BNP slightly elevated at 120. Chest x-ray concerning for possible pulmonary edema. We'll give IV Lasix. Suspect COPD exacerbation with mild volume overload component. Case discussed with Dr. Lavetta Nielsen, hospitalist at this time for admission. ____________________________________________   FINAL CLINICAL IMPRESSION(S) / ED DIAGNOSES  Final diagnoses:  SOB (shortness of breath)  COPD with exacerbation      Joanne Gavel, MD 06/29/15 [phone]

## 2015-06-29 NOTE — ED Notes (Signed)
Pt not transferred to floor until 0530 due to RT unavailablity.

## 2015-06-29 NOTE — Care Management Important Message (Signed)
Important Message  Patient Details  Name: [name] MRN: [id number] Date of Birth: 1956/08/07   Medicare Important Message Given:  Yes-second notification given    Katrina Stack, RN 06/29/2015, 9:08 AM

## 2015-06-30 MED ORDER — INFLUENZA VAC SPLIT QUAD 0.5 ML IM SUSY
0.5000 mL | PREFILLED_SYRINGE | INTRAMUSCULAR | Status: AC
  Administered 2015-07-01: 0.5 mL via INTRAMUSCULAR
  Filled 2015-06-30: qty 0.5

## 2015-06-30 MED ORDER — METHYLPREDNISOLONE SODIUM SUCC 40 MG IJ SOLR
40.0000 mg | Freq: Two times a day (BID) | INTRAMUSCULAR | Status: DC
  Administered 2015-06-30 – 2015-07-03 (×6): 40 mg via INTRAVENOUS
  Filled 2015-06-30 (×7): qty 1

## 2015-06-30 MED ORDER — FUROSEMIDE 20 MG PO TABS
20.0000 mg | ORAL_TABLET | Freq: Two times a day (BID) | ORAL | Status: DC
  Administered 2015-06-30 – 2015-07-04 (×8): 20 mg via ORAL
  Filled 2015-06-30 (×9): qty 1

## 2015-06-30 MED ORDER — PNEUMOCOCCAL VAC POLYVALENT 25 MCG/0.5ML IJ INJ
0.5000 mL | INJECTION | INTRAMUSCULAR | Status: AC
  Administered 2015-07-01: 0.5 mL via INTRAMUSCULAR
  Filled 2015-06-30: qty 0.5

## 2015-06-30 NOTE — Progress Notes (Signed)
[place] [facility] [place] NAME: Theresa [name]    MR#:  [id number]  DATE OF BIRTH:  October 10, 1956  SUBJECTIVE:  CHIEF COMPLAINT:  Patient's feeling much better today, sitting in the bed. Planning to quit smoking . Off BiPAP to get her breakfast today.   REVIEW OF SYSTEMS:  CONSTITUTIONAL: No fever, fatigue or weakness.  EYES: No blurred or double vision.  EARS, NOSE, AND THROAT: No tinnitus or ear pain.  RESPIRATORY: Reporting cough, improving shortness of breath and wheezing . Denies hemoptysis.  CARDIOVASCULAR: No chest pain, orthopnea, edema.  GASTROINTESTINAL: No nausea, vomiting, diarrhea or abdominal pain.  GENITOURINARY: No dysuria, hematuria.  ENDOCRINE: No polyuria, nocturia,  HEMATOLOGY: No anemia, easy bruising or bleeding SKIN: No rash or lesion. MUSCULOSKELETAL: No joint pain or arthritis.   NEUROLOGIC: No tingling, numbness, weakness.  PSYCHIATRY: No anxiety or depression.   DRUG ALLERGIES:   Allergies  Allergen Reactions  . Codeine Nausea And Vomiting    VITALS:  Blood pressure 112/69, pulse 80, temperature 98.1 F (36.7 C), temperature source Oral, resp. rate 20, height _0  (1.6 m), weight 115.758 kg (255 lb 3.2 oz), SpO2 94 %.  PHYSICAL EXAMINATION:  GENERAL:  59 y.o.-year-old patient lying in the bed with no acute distress.  EYES: Pupils equal, round, reactive to light and accommodation. No scleral icterus. Extraocular muscles intact.  HEENT: Head atraumatic, normocephalic. Oropharynx and nasopharynx clear.  NECK:  Supple, no jugular venous distention. No thyroid enlargement, no tenderness.  LUNGS:  bronchial breath sounds , no  wheezing, rales,rhonchi . No use of accessory muscles of respiration.  CARDIOVASCULAR: S1, S2 normal. No murmurs, rubs, or gallops.  ABDOMEN: Soft, nontender, nondistended. Bowel sounds present. No organomegaly or mass.  EXTREMITIES: No pedal edema, cyanosis, or clubbing.  NEUROLOGIC:  Cranial nerves II through XII are intact. Muscle strength 5/5 in all extremities. Sensation intact. Gait not checked.  PSYCHIATRIC: The patient is alert and oriented x 3. Anxious SKIN: No obvious rash, lesion, or ulcer.    LABORATORY PANEL:   CBC  Recent Labs Lab 06/29/15 0706  WBC 8.7  HGB 14.7  HCT 45.7  PLT 151   ------------------------------------------------------------------------------------------------------------------  Chemistries   Recent Labs Lab 06/29/15 0039 06/29/15 0706  NA 143 142  K 3.5 4.2  CL 106 106  CO2 30 28  GLUCOSE 118* 158*  BUN 20 19  CREATININE 0.77 0.68  CALCIUM 9.4 9.3  AST 19  --   ALT 17  --   ALKPHOS 62  --   BILITOT 0.4  --    ------------------------------------------------------------------------------------------------------------------  Cardiac Enzymes  Recent Labs Lab 06/29/15 1905  TROPONINI <0.03   ------------------------------------------------------------------------------------------------------------------  RADIOLOGY:  Dg Chest 1 View  06/29/2015   CLINICAL DATA:  Shortness of breath all day, worsened over the last 4 hours.  EXAM: CHEST  1 VIEW  COMPARISON:  04/18/2015, 03/01/2015  FINDINGS: Heart is mildly enlarged. Ill-defined perivascular haziness and interstitial thickening. No confluent airspace disease. The lungs are hyperinflated. No large pleural effusion or pneumothorax. Osseous structures are intact.  IMPRESSION: Perivascular haziness and interstitial thickening, suggestive of pulmonary edema. Heart is mildly enlarged, may be accentuated by technique. Recommend correlation for CHF.   Electronically Signed   By: Jeb Levering M.D.   On: 06/29/2015 02:30    EKG:   Orders placed or performed during the hospital encounter of 06/29/15  . EKG 12-Lead  . EKG 12-Lead  . ED EKG  . ED  EKG    ASSESSMENT AND PLAN:   59 year old Caucasian female history of COPD and non-oxygen dependent presenting with  shortness of breath  1. Acute on chronic diastolic congestive heart failure: Last ejection fraction noted to be 55% in May of this year Continue on telemetry Continue aspirin therapy ,diuresis with by mouth Lasix Discontinue IV Lasix,  Continue daily weights, I's and O's, Appreciate  cardiology recommendations  2. COPD acute exacerbation: Wean off BiPAP as tolerated  DuoNeb treatments supplemental oxygen  Taper IV steroids to 40 mg IV every 12 hours and antibiotics Check ablated pulse ox Wean off oxygen as tolerated 2 roommates  3. Essential hypertension: Continue home medications  4. History of depression and anxiety-resume her home medications Zoloft 150 mg once a day  5. Tobacco abuse disorder Counseled patient to quit smoking for 3-5 minutes Nicotine patch 21 mg once daily Patient is planning to quit smoking if insurance can cover her nicotine gum and patches  4. Venous thromboembolism prophylactic: Heparin subcutaneous       All the records are reviewed and case discussed with Care Management/Social Workerr. Management plans discussed with the patient, family and they are in agreement.  CODE STATUS: Full code  TOTAL TIME TAKING CARE OF THIS PATIENT: 35 minutes.   POSSIBLE D/C IN 1-2 DAYS, DEPENDING ON CLINICAL CONDITION.   Nicholes Mango M.D on 06/30/2015 at 3:23 PM  Between 7am to 6pm - Pager - [phone] After 6pm go to [email or web address] - password [email or web address] [facility]  [facility]  Office  [phone]  CC: Primary care physician; [name], PA

## 2015-06-30 NOTE — Progress Notes (Signed)
Initial Nutrition Assessment   INTERVENTION:   Meals and Snacks: Cater to patient preferences Education: pt verbalizes understanding of current diet order  NUTRITION DIAGNOSIS:   No nutrition diagnosis at this time  GOAL:   Patient will meet greater than or equal to 90% of their needs  MONITOR:    (Energy Intake, Anthropometrisc, Electrolyte/Renal profile, Digestive System)  REASON FOR ASSESSMENT:   Diagnosis    ASSESSMENT:    Pt admitted with acute on chronic CHF  Past Medical History  Diagnosis Date  . COPD (chronic obstructive pulmonary disease)   . Hypertension   . Gastritis   . Lumbar radiculitis   . Muscle weakness   . DDD (degenerative disc disease), cervical   . Depression   . Chronic rhinitis   . Chronic generalized abdominal pain   . Anxiety   . Edema   . Carpal tunnel syndrome   . Low back pain   . Hematuria   . Malodorous urine   . Sensory urge incontinence   . Obesity   . Renal cyst   . Tobacco abuse    Diet Order:  Diet Heart Room service appropriate?: Yes; Fluid consistency:: Thin   Energy Intake: recorded po intake 73% on average  Food and Nutrition Related history: pt reports appetite and intake good prior to admission  Electrolyte and Renal Profile:  Recent Labs Lab 06/29/15 0039 06/29/15 0706  BUN 20 19  CREATININE 0.77 0.68  NA 143 142  K 3.5 4.2   Glucose Profile: No results for input(s): GLUCAP in the last 72 hours. Protein Profile:  Recent Labs Lab 06/29/15 0039  ALBUMIN 3.8   Meds: lasix, solumedrol  Height:   Ht Readings from Last 1 Encounters:  06/29/15 _0  (1.6 m)    Weight: weight trending up, pt does report acute wt gain due to fluid  Wt Readings from Last 1 Encounters:  06/30/15 255 lb 3.2 oz (115.758 kg)    Filed Weights   06/29/15 0039 06/29/15 0633 06/30/15 0539  Weight: 251 lb (113.853 kg) 257 lb 4.8 oz (116.711 kg) 255 lb 3.2 oz (115.758 kg)   Wt Readings from Last 10 Encounters:   06/30/15 255 lb 3.2 oz (115.758 kg)  05/05/15 246 lb (111.585 kg)  04/28/15 244 lb 9.6 oz (110.95 kg)  04/21/15 236 lb 3.2 oz (107.14 kg)  10/14/14 230 lb 3 oz (104.413 kg)  03/01/15 238 lb (107.956 kg)  10/17/13 225 lb (102.059 kg)  07/30/13 220 lb (99.791 kg)    BMI:  Body mass index is 45.22 kg/(m^2).  LOW Care Level  Kerman Passey MS, [place], LDN [phone] Pager

## 2015-06-30 NOTE — Progress Notes (Signed)
Patient was made an initial appointment at the [facility] on July 15, 2015 at 11:00am. Thank you.

## 2015-07-01 ENCOUNTER — Inpatient Hospital Stay: Payer: Medicare PPO

## 2015-07-01 DIAGNOSIS — J441 Chronic obstructive pulmonary disease with (acute) exacerbation: Secondary | ICD-10-CM | POA: Diagnosis not present

## 2015-07-01 LAB — CBC
HCT: 45.6 % (ref 35.0–47.0)
Hemoglobin: 14.8 g/dL (ref 12.0–16.0)
MCH: 32.5 pg (ref 26.0–34.0)
MCHC: 32.5 g/dL (ref 32.0–36.0)
MCV: 99.9 fL (ref 80.0–100.0)
Platelets: 165 10*3/uL (ref 150–440)
RBC: 4.57 MIL/uL (ref 3.80–5.20)
RDW: 14.6 % — ABNORMAL HIGH (ref 11.5–14.5)
WBC: 13.2 10*3/uL — ABNORMAL HIGH (ref 3.6–11.0)

## 2015-07-01 LAB — BASIC METABOLIC PANEL
Anion gap: 6 (ref 5–15)
BUN: 30 mg/dL — ABNORMAL HIGH (ref 6–20)
CO2: 33 mmol/L — ABNORMAL HIGH (ref 22–32)
Calcium: 8.8 mg/dL — ABNORMAL LOW (ref 8.9–10.3)
Chloride: 102 mmol/L (ref 101–111)
Creatinine, Ser: 0.67 mg/dL (ref 0.44–1.00)
GFR calc Af Amer: >60 mL/min (ref >60)
GFR calc non Af Amer: >60 mL/min (ref >60)
Glucose, Bld: 146 mg/dL — ABNORMAL HIGH (ref 65–99)
Potassium: 3.8 mmol/L (ref 3.5–5.1)
Sodium: 141 mmol/L (ref 135–145)

## 2015-07-01 MED ORDER — ALBUTEROL SULFATE HFA 108 (90 BASE) MCG/ACT IN AERS: 2.0000 | INHALATION_SPRAY | RESPIRATORY_TRACT | Status: DC | PRN

## 2015-07-01 MED ORDER — BUTALBITAL-APAP-CAFFEINE 50-325-40 MG PO TABS
1.0000 | ORAL_TABLET | Freq: Four times a day (QID) | ORAL | Status: DC | PRN
  Administered 2015-07-01 – 2015-07-02 (×5): 1 via ORAL
  Filled 2015-07-01 (×5): qty 1

## 2015-07-01 MED ORDER — LEVOFLOXACIN IN D5W 500 MG/100ML IV SOLN
500.0000 mg | INTRAVENOUS | Status: DC
  Administered 2015-07-01: 500 mg via INTRAVENOUS
  Filled 2015-07-01 (×2): qty 100

## 2015-07-01 NOTE — Patient Outreach (Signed)
[place] [facility]) Care Management  07/01/2015  Theresa [name] 02-18-1956 [id number]   Referral from inpatient at [facility], assigned to [name], NP for patient outreach.  [name] L. [name], [place] Care Management Assistant

## 2015-07-01 NOTE — Care Management Important Message (Signed)
Important Message  Patient Details  Name: Quantia Grullon MRN: [id number] Date of Birth: 1956/02/07   Medicare Important Message Given:  Yes-third notification given    Darius Bump Allmond 07/01/2015, 9:46 AM

## 2015-07-01 NOTE — Progress Notes (Signed)
[facility]  SUBJECTIVE: I'm feeling better   Filed Vitals:   06/30/15 1226 06/30/15 1312 06/30/15 2006 07/01/15 0434  BP: 117/73 112/69 126/70 110/66  Pulse: 80  73 64  Temp: 98.1 F (36.7 C)  98.3 F (36.8 C) 97.7 F (36.5 C)  TempSrc: Oral  Oral Oral  Resp: _0 Height:      Weight:    114.488 kg (252 lb 6.4 oz)  SpO2: 94% 94% 94% 98%     Intake/Output Summary (Last 24 hours) at 07/01/15 0851 Last data filed at 07/01/15 0730  Gross per 24 hour  Intake    480 ml  Output   3300 ml  Net  -2820 ml      PHYSICAL EXAM  General: Well developed, well nourished, in no acute distress HEENT:  Normocephalic and atramatic Neck:  No JVD.  Lungs: Diffuse expiratory wheezes Heart: HRRR . Normal S1 and S2 without gallops or murmurs.  Abdomen: Bowel sounds are positive, abdomen soft and non-tender  Msk:  Back normal, normal gait. Normal strength and tone for age. Extremities: No clubbing, cyanosis or edema.   Neuro: Alert and oriented X 3. Psych:  Good affect, responds appropriately   LABS: Basic Metabolic Panel:  Recent Labs  06/29/15 0706 07/01/15 0428  NA 142 141  K 4.2 3.8  CL 106 102  CO2 28 33*  GLUCOSE 158* 146*  BUN 19 30*  CREATININE 0.68 0.67  CALCIUM 9.3 8.8*   Liver Function Tests:  Recent Labs  06/29/15 0039  AST 19  ALT 17  ALKPHOS 62  BILITOT 0.4  PROT 6.7  ALBUMIN 3.8   No results for input(s): LIPASE, AMYLASE in the last 72 hours. CBC:  Recent Labs  06/29/15 0706 07/01/15 0428  WBC 8.7 13.2*  HGB 14.7 14.8  HCT 45.7 45.6  MCV 100.1* 99.9  PLT 151 165   Cardiac Enzymes:  Recent Labs  06/29/15 0706 06/29/15 1314 06/29/15 1905  TROPONINI <0.03 <0.03 <0.03   BNP: Invalid input(s): POCBNP D-Dimer: No results for input(s): DDIMER in the last 72 hours. Hemoglobin A1C: No results for input(s): HGBA1C in the last 72 hours. Fasting Lipid Panel: No results for input(s): CHOL, HDL, LDLCALC, TRIG, CHOLHDL, LDLDIRECT in  the last 72 hours. Thyroid Function Tests: No results for input(s): TSH, T4TOTAL, T3FREE, THYROIDAB in the last 72 hours.  Invalid input(s): FREET3 Anemia Panel: No results for input(s): VITAMINB12, FOLATE, FERRITIN, TIBC, IRON, RETICCTPCT in the last 72 hours.  No results found.   Echo normal left ventricular function with LVEF of 5055% by echocardiogram 10/21/14  TELEMETRY: Normal sinus rhythm:  ASSESSMENT AND PLAN:  Principal Problem:   Acute on chronic diastolic congestive heart failure Active Problems:   Respiratory insufficiency    1. Acute on chronic diastolic congestive heart failure, with known insignificant coronary artery disease and normal left ventricular function, negative troponin, improving after diuresis 2. COPD, ongoing tobacco abuse  Recommendations  1. Continue current meds 2. Continue diuresis 3. Defer further cardiac diagnostics at this time 4. Strongly encouraged patient to stop smoking   Thirza Pellicano, MD, PhD, [facility] 07/01/2015 8:51 AM

## 2015-07-01 NOTE — Progress Notes (Signed)
[facility] [place] NAME: Theresa [name]    MR#:  [id number]  DATE OF BIRTH:  01/31/1956  SUBJECTIVE:  CHIEF COMPLAINT:  Patient's  Wheezing and SOB today. Planning to quit smoking .   REVIEW OF SYSTEMS:  CONSTITUTIONAL: No fever, fatigue or weakness.  EYES: No blurred or double vision.  EARS, NOSE, AND THROAT: No tinnitus or ear pain.  RESPIRATORY: Reporting cough,  shortness of breath and wheezing . Denies hemoptysis.  CARDIOVASCULAR: No chest pain, orthopnea, edema.  GASTROINTESTINAL: No nausea, vomiting, diarrhea or abdominal pain.  GENITOURINARY: No dysuria, hematuria.  ENDOCRINE: No polyuria, nocturia,  HEMATOLOGY: No anemia, easy bruising or bleeding SKIN: No rash or lesion. MUSCULOSKELETAL: No joint pain or arthritis.   NEUROLOGIC: No tingling, numbness, weakness.  PSYCHIATRY: No anxiety or depression.   DRUG ALLERGIES:   Allergies  Allergen Reactions  . Codeine Nausea And Vomiting    VITALS:  Blood pressure 129/74, pulse 64, temperature 97.8 F (36.6 C), temperature source Oral, resp. rate 18, height _0  (1.6 m), weight 114.488 kg (252 lb 6.4 oz), SpO2 96 %.  PHYSICAL EXAMINATION:  GENERAL:  59 y.o.-year-old patient lying in the bed with no acute distress.  EYES: Pupils equal, round, reactive to light and accommodation. No scleral icterus. Extraocular muscles intact.  HEENT: Head atraumatic, normocephalic. Oropharynx and nasopharynx clear.  NECK:  Supple, no jugular venous distention. No thyroid enlargement, no tenderness.  LUNGS:  bronchial breath sounds , diffuse wheezing, rales,rhonchi . No use of accessory muscles of respiration.  CARDIOVASCULAR: S1, S2 normal. No murmurs, rubs, or gallops.  ABDOMEN: Soft, nontender, nondistended. Bowel sounds present. No organomegaly or mass.  EXTREMITIES: No pedal edema, cyanosis, or clubbing.  NEUROLOGIC: Cranial nerves II through XII are intact. Muscle strength 5/5 in  all extremities. Sensation intact. Gait not checked.  PSYCHIATRIC: The patient is alert and oriented x 3. Anxious SKIN: No obvious rash, lesion, or ulcer.    LABORATORY PANEL:   CBC  Recent Labs Lab 07/01/15 0428  WBC 13.2*  HGB 14.8  HCT 45.6  PLT 165   ------------------------------------------------------------------------------------------------------------------  Chemistries   Recent Labs Lab 06/29/15 0039  07/01/15 0428  NA 143  < > 141  K 3.5  < > 3.8  CL 106  < > 102  CO2 30  < > 33*  GLUCOSE 118*  < > 146*  BUN 20  < > 30*  CREATININE 0.77  < > 0.67  CALCIUM 9.4  < > 8.8*  AST 19  --   --   ALT 17  --   --   ALKPHOS 62  --   --   BILITOT 0.4  --   --   < > = values in this interval not displayed. ------------------------------------------------------------------------------------------------------------------  Cardiac Enzymes  Recent Labs Lab 06/29/15 1905  TROPONINI <0.03   ------------------------------------------------------------------------------------------------------------------  RADIOLOGY:  No results found.  EKG:   Orders placed or performed during the hospital encounter of 06/29/15  . EKG 12-Lead  . EKG 12-Lead  . ED EKG  . ED EKG    ASSESSMENT AND PLAN:   59 year old Caucasian female history of COPD and non-oxygen dependent presenting with shortness of breath  1. Acute on chronic diastolic congestive heart failure: Last ejection fraction noted to be 55% in May of this year Continue on telemetry Continue aspirin therapy ,diuresis with by mouth Lasix Discontinue IV Lasix,  Continue daily weights, I's and O's, Appreciate  cardiology  recommendations  2. COPD acute exacerbation: Weaned off BiPAP   DuoNeb treatments supplemental oxygen   continue IV steroids  40 mg IV every 12 hours and antibiotics Check ablated pulse ox Wean off oxygen as tolerated 2 room air levoflox  3. Essential hypertension: Continue home  medications  4. History of depression and anxiety-resume her home medications Zoloft 150 mg once a day  5. Tobacco abuse disorder Counseled patient to quit smoking for 3-5 minutes Nicotine patch 21 mg once daily Patient is planning to quit smoking if insurance can cover her nicotine gum and patches  4. Venous thromboembolism prophylactic: Heparin subcutaneous       All the records are reviewed and case discussed with Care Management/Social Workerr. Management plans discussed with the patient, family and they are in agreement.  CODE STATUS: Full code  TOTAL TIME TAKING CARE OF THIS PATIENT: 35 minutes.   POSSIBLE D/C IN 1-2 DAYS, DEPENDING ON CLINICAL CONDITION.   Nicholes Mango M.D on 07/01/2015 at 2:53 PM  Between 7am to 6pm - Pager - [phone] After 6pm go to [email or web address] - password [email or web address]  [facility]  Office  [phone]  CC: Primary care physician; [name], PA

## 2015-07-01 NOTE — Care Management (Signed)
Patient continues to be agreeable to home health.  Heads up referral to [facility] for SN PT OT and aide.  Patient is agreeable to [facility] referral.  Her PP- Miguel Aschoff is a participating physician

## 2015-07-01 NOTE — Progress Notes (Signed)
Patient is complaining of a severe headache. Normally smokes, and drinks a large amount of caffeine per day. Morphine and oxy are not helping. MD paged a couple of times. Response received to give fioricet. Patient also requesting rescue inhaler, albuterol, at bedside, MD approved.

## 2015-07-02 MED ORDER — GUAIFENESIN ER 600 MG PO TB12
600.0000 mg | ORAL_TABLET | Freq: Two times a day (BID) | ORAL | Status: DC
  Administered 2015-07-02 – 2015-07-04 (×5): 600 mg via ORAL
  Filled 2015-07-02 (×5): qty 1

## 2015-07-02 MED ORDER — ALPRAZOLAM 0.5 MG PO TABS
0.5000 mg | ORAL_TABLET | Freq: Three times a day (TID) | ORAL | Status: DC | PRN
  Administered 2015-07-03 – 2015-07-04 (×2): 0.5 mg via ORAL
  Filled 2015-07-02 (×2): qty 1

## 2015-07-02 MED ORDER — OXYCODONE HCL 5 MG PO TABS
10.0000 mg | ORAL_TABLET | ORAL | Status: DC | PRN
  Administered 2015-07-02 – 2015-07-04 (×6): 10 mg via ORAL
  Filled 2015-07-02 (×6): qty 2

## 2015-07-02 MED ORDER — ACETYLCYSTEINE 20 % IN SOLN
4.0000 mL | Freq: Two times a day (BID) | RESPIRATORY_TRACT | Status: DC
  Filled 2015-07-02 (×2): qty 4

## 2015-07-02 MED ORDER — ACETYLCYSTEINE 20 % IN SOLN
4.0000 mL | Freq: Two times a day (BID) | RESPIRATORY_TRACT | Status: DC
  Administered 2015-07-02 – 2015-07-04 (×5): 4 mL via RESPIRATORY_TRACT
  Filled 2015-07-02 (×3): qty 4

## 2015-07-02 MED ORDER — LEVOFLOXACIN 500 MG PO TABS
500.0000 mg | ORAL_TABLET | Freq: Every day | ORAL | Status: DC
  Administered 2015-07-02 – 2015-07-03 (×2): 500 mg via ORAL
  Filled 2015-07-02 (×2): qty 1

## 2015-07-02 MED ORDER — GUAIFENESIN 100 MG/5ML PO SOLN
200.0000 mg | ORAL | Status: DC | PRN
  Administered 2015-07-03: 200 mg via ORAL
  Filled 2015-07-02: qty 10

## 2015-07-02 MED ORDER — KETOROLAC TROMETHAMINE 30 MG/ML IJ SOLN
30.0000 mg | Freq: Three times a day (TID) | INTRAMUSCULAR | Status: AC
  Administered 2015-07-02 (×2): 30 mg via INTRAVENOUS
  Filled 2015-07-02 (×2): qty 1

## 2015-07-02 MED ORDER — TIOTROPIUM BROMIDE MONOHYDRATE 18 MCG IN CAPS
18.0000 ug | ORAL_CAPSULE | Freq: Every day | RESPIRATORY_TRACT | Status: DC
  Administered 2015-07-02 – 2015-07-04 (×3): 18 ug via RESPIRATORY_TRACT
  Filled 2015-07-02: qty 5

## 2015-07-02 NOTE — Progress Notes (Signed)
Pulse ox checked while ambulating. Sats are in the upper 90's on 3L at rest, patient able to go down to 2.5L at rest which is chronic. While ambulating around the nurses station, patient dropped to 84% on 3L about 3/4 of the way around the circle. Patient stated she felt short of breath, but was able to make it back to her room. Sats came back up to 94% after about a minute of resting. Patient states she was told to increase her oxygen to around 4L while ambulating at home.

## 2015-07-02 NOTE — Progress Notes (Signed)
Spoke with pt regarding bed alarm and reasons for use.  Pt refusing bed alarm at this time.  Also discussed pt not have BM for several days.  Pt states that this is normal for her.  Pt declines wanting a stool softener or laxative at this time.   Lynnda Shields, RN

## 2015-07-02 NOTE — Progress Notes (Signed)
[place] [facility] [place] NAME: Theresa [name]    MR#:  [id number]  DATE OF BIRTH:  01/19/1956  SUBJECTIVE:  CHIEF COMPLAINT:   Patient continues to be very short of breath and wheezing not able to cough up much complains of headache and anxiety  REVIEW OF SYSTEMS:  CONSTITUTIONAL: No fever, fatigue or weakness.  EYES: No blurred or double vision.  EARS, NOSE, AND THROAT: No tinnitus or ear pain.  RESPIRATORY: Reporting cough,  shortness of breath and wheezing . Denies hemoptysis.  CARDIOVASCULAR: No chest pain, orthopnea, edema.  GASTROINTESTINAL: No nausea, vomiting, diarrhea or abdominal pain.  GENITOURINARY: No dysuria, hematuria.  ENDOCRINE: No polyuria, nocturia,  HEMATOLOGY: No anemia, easy bruising or bleeding SKIN: No rash or lesion. MUSCULOSKELETAL: No joint pain or arthritis.   NEUROLOGIC: No tingling, numbness, weakness. Positive headache  PSYCHIATRY: Positive anxiety , denies depression.   DRUG ALLERGIES:   Allergies  Allergen Reactions  . Codeine Nausea And Vomiting    VITALS:  Blood pressure 123/77, pulse 72, temperature 98.5 F (36.9 C), temperature source Oral, resp. rate 18, height _0  (1.6 m), weight 115.168 kg (253 lb 14.4 oz), SpO2 94 %.  PHYSICAL EXAMINATION:  GENERAL:  59 y.o.-year-old patient lying in the bed with no acute distress.  EYES: Pupils equal, round, reactive to light and accommodation. No scleral icterus. Extraocular muscles intact.  HEENT: Head atraumatic, normocephalic. Oropharynx and nasopharynx clear.  NECK:  Supple, no jugular venous distention. No thyroid enlargement, no tenderness.  LUNGS:  bronchial breath sounds , diffuse wheezing, rales,rhonchi . No use of accessory muscles of respiration.  CARDIOVASCULAR: S1, S2 normal. No murmurs, rubs, or gallops.  ABDOMEN: Soft, nontender, nondistended. Bowel sounds present. No organomegaly or mass.  EXTREMITIES: No pedal edema, cyanosis, or  clubbing.  NEUROLOGIC: Cranial nerves II through XII are intact. Muscle strength 5/5 in all extremities. Sensation intact. Gait not checked.  PSYCHIATRIC: The patient is alert and oriented x 3. Anxious SKIN: No obvious rash, lesion, or ulcer.    LABORATORY PANEL:   CBC  Recent Labs Lab 07/01/15 0428  WBC 13.2*  HGB 14.8  HCT 45.6  PLT 165   ------------------------------------------------------------------------------------------------------------------  Chemistries   Recent Labs Lab 06/29/15 0039  07/01/15 0428  NA 143  < > 141  K 3.5  < > 3.8  CL 106  < > 102  CO2 30  < > 33*  GLUCOSE 118*  < > 146*  BUN 20  < > 30*  CREATININE 0.77  < > 0.67  CALCIUM 9.4  < > 8.8*  AST 19  --   --   ALT 17  --   --   ALKPHOS 62  --   --   BILITOT 0.4  --   --   < > = values in this interval not displayed. ------------------------------------------------------------------------------------------------------------------  Cardiac Enzymes  Recent Labs Lab 06/29/15 1905  TROPONINI <0.03   ------------------------------------------------------------------------------------------------------------------  RADIOLOGY:  Dg Chest 2 View  07/01/2015   CLINICAL DATA:  Shortness of breath.  EXAM: CHEST  2 VIEW  COMPARISON:  June 29, 2015  FINDINGS: The heart size and mediastinal contours are stable. The heart size is enlarged. There is interval developed patchy consolidation of both lung bases at least in part due to atelectasis. Mild increased pulmonary interstitium is identified bilaterally unchanged. The visualized skeletal structures are stable.  IMPRESSION: Mild interstitial pulmonary edema unchanged compared to prior exam.  Interval developed patchy  consolidation both lung bases at least in part due to atelectasis but underlying pneumonia is not excluded.   Electronically Signed   By: Abelardo Diesel M.D.   On: 07/01/2015 16:07    EKG:   Orders placed or performed during the  hospital encounter of 06/29/15  . EKG 12-Lead  . EKG 12-Lead  . ED EKG  . ED EKG    ASSESSMENT AND PLAN:   59 year old Caucasian female history of COPD and non-oxygen dependent presenting with shortness of breath  1. Acute on chronic diastolic congestive heart failure Continue on telemetry Continue aspirin therapy ,diuresis with by mouth Lasix Oral Lasix Continue daily weights, I's and O's, Appreciate  cardiology recommendations  2. Acute on chronic COPD acute exacerbation:  DuoNeb treatments supplemental oxygen   continue IV steroids  40 mg IV every 12 hours change by mouth anabiotic's Add Mucinex and Mucomyst Add Spiriva  3. Essential hypertension: Continue home medications  4. History of depression and continue home medications Zoloft 150 mg once a day and Xanax  5. Tobacco abuse disorder Counseled patient to quit smoking f  6. Venous thromboembolism prophylactic: Heparin subcutaneous       All the records are reviewed and case discussed with Care Management/Social Workerr. Management plans discussed with the patient, family and they are in agreement.  CODE STATUS: Full code  TOTAL TIME TAKING CARE OF THIS PATIENT: 42mnutes.       PDustin FlockM.D on 07/02/2015 at 1:01 PM  Between 7am to 6pm - Pager - [phone] 6pm go to [email or web address] - password [email or web address] [facility]  Office  [phone] CC: Primary care physician; DVernie Murders PA

## 2015-07-02 NOTE — Progress Notes (Addendum)
Notified Dr. Posey Pronto that patient is requesting xanax PRN, she has taken it before. MD will order. Also notified MD that patient's headache has not been relieved with medication. MD looking at orders to try something else.

## 2015-07-02 NOTE — Progress Notes (Signed)
Patient's breathing has improved today. Still expiratory wheezes in the lungs. Severe headache is still present. Some relief given when 45m of oxycodone and fioricet given together. No other complaints other than head. Sinus rhythm on tele. Will continue to monitor.

## 2015-07-02 NOTE — Progress Notes (Signed)
[facility]  SUBJECTIVE: I'm feeling better   Filed Vitals:   07/02/15 1157 07/02/15 1245 07/02/15 1255 07/02/15 1257  BP:      Pulse:      Temp:      TempSrc:      Resp:      Height:      Weight:      SpO2: 94% 95% 84% 94%     Intake/Output Summary (Last 24 hours) at 07/02/15 1649 Last data filed at 07/02/15 1405  Gross per 24 hour  Intake    580 ml  Output   2500 ml  Net  -1920 ml      PHYSICAL EXAM  General: Well developed, well nourished, in no acute distress HEENT:  Normocephalic and atramatic Neck:  No JVD.  Lungs: Scattered expiratory wheezes Heart: HRRR . Normal S1 and S2 without gallops or murmurs.  Abdomen: Bowel sounds are positive, abdomen soft and non-tender  Msk:  Back normal, normal gait. Normal strength and tone for age. Extremities: Trace bilateral pedal edema Neuro: Alert and oriented X 3. Psych:  Good affect, responds appropriately   LABS: Basic Metabolic Panel:  Recent Labs  07/01/15 0428  NA 141  K 3.8  CL 102  CO2 33*  GLUCOSE 146*  BUN 30*  CREATININE 0.67  CALCIUM 8.8*   Liver Function Tests: No results for input(s): AST, ALT, ALKPHOS, BILITOT, PROT, ALBUMIN in the last 72 hours. No results for input(s): LIPASE, AMYLASE in the last 72 hours. CBC:  Recent Labs  07/01/15 0428  WBC 13.2*  HGB 14.8  HCT 45.6  MCV 99.9  PLT 165   Cardiac Enzymes:  Recent Labs  06/29/15 1905  TROPONINI <0.03   BNP: Invalid input(s): POCBNP D-Dimer: No results for input(s): DDIMER in the last 72 hours. Hemoglobin A1C: No results for input(s): HGBA1C in the last 72 hours. Fasting Lipid Panel: No results for input(s): CHOL, HDL, LDLCALC, TRIG, CHOLHDL, LDLDIRECT in the last 72 hours. Thyroid Function Tests: No results for input(s): TSH, T4TOTAL, T3FREE, THYROIDAB in the last 72 hours.  Invalid input(s): FREET3 Anemia Panel: No results for input(s): VITAMINB12, FOLATE, FERRITIN, TIBC, IRON, RETICCTPCT in the last 72  hours.  Dg Chest 2 View  07/01/2015   CLINICAL DATA:  Shortness of breath.  EXAM: CHEST  2 VIEW  COMPARISON:  June 29, 2015  FINDINGS: The heart size and mediastinal contours are stable. The heart size is enlarged. There is interval developed patchy consolidation of both lung bases at least in part due to atelectasis. Mild increased pulmonary interstitium is identified bilaterally unchanged. The visualized skeletal structures are stable.  IMPRESSION: Mild interstitial pulmonary edema unchanged compared to prior exam.  Interval developed patchy consolidation both lung bases at least in part due to atelectasis but underlying pneumonia is not excluded.   Electronically Signed   By: Abelardo Diesel M.D.   On: 07/01/2015 16:07     Echo normal left ventricular function by echocardiogram 10/21/14  TELEMETRY: Normal sinus rhythm:  ASSESSMENT AND PLAN:  Principal Problem:   Acute on chronic diastolic congestive heart failure Active Problems:   Respiratory insufficiency    1. Acute on chronic diastolic congestive heart failure, known insignificant coronary artery disease and normal left ventricular function, negative troponin, significantly improved after diuresis 2. COPD exacerbation, ongoing tobacco abuse  Recommendations  1. Continue current medications 2. Continue diuresis 3. Defer further cardiac diagnostics at this time 4. Encourage patient to stop smoking  Sign off for  now, please call if any questions   Hughie Melroy, MD, PhD, [facility] 07/02/2015 4:49 PM

## 2015-07-03 MED ORDER — PREDNISONE 10 MG PO TABS: 10.0000 mg | ORAL_TABLET | Freq: Every day | ORAL | Status: DC

## 2015-07-03 MED ORDER — PREDNISONE 50 MG PO TABS: 50.0000 mg | ORAL_TABLET | Freq: Every day | ORAL | Status: DC

## 2015-07-03 MED ORDER — PREDNISONE 20 MG PO TABS: 40.0000 mg | ORAL_TABLET | Freq: Every day | ORAL | Status: DC

## 2015-07-03 MED ORDER — PREDNISONE 20 MG PO TABS: 20.0000 mg | ORAL_TABLET | Freq: Every day | ORAL | Status: DC

## 2015-07-03 MED ORDER — PREDNISONE 20 MG PO TABS: 30.0000 mg | ORAL_TABLET | Freq: Every day | ORAL | Status: DC

## 2015-07-03 MED ORDER — DOCUSATE SODIUM 100 MG PO CAPS
200.0000 mg | ORAL_CAPSULE | Freq: Two times a day (BID) | ORAL | Status: DC | PRN
  Administered 2015-07-04: 200 mg via ORAL
  Filled 2015-07-03 (×2): qty 2

## 2015-07-03 MED ORDER — PREDNISONE 50 MG PO TABS
60.0000 mg | ORAL_TABLET | Freq: Every day | ORAL | Status: AC
  Administered 2015-07-04: 60 mg via ORAL
  Filled 2015-07-03 (×2): qty 1

## 2015-07-03 NOTE — Care Management (Signed)
Patient's [facility] referral was accepted.  Respiratory status has improved.  Would anticipate discharge home over the next 24-48 hours.  Patient continues to be in agreement with home health services that have been previously discussed

## 2015-07-03 NOTE — Progress Notes (Signed)
[place] [facility] [place] NAME: Theresa [name]    MR#:  [id number]  DATE OF BIRTH:  07-Apr-1956  SUBJECTIVE:  CHIEF COMPLAINT:   Improvement in her breathing now able to break up cough  REVIEW OF SYSTEMS:  CONSTITUTIONAL: No fever, fatigue or weakness.  EYES: No blurred or double vision.  EARS, NOSE, AND THROAT: No tinnitus or ear pain.  RESPIRATORY: Reporting cough,  shortness of breath and wheezing . Denies hemoptysis.  CARDIOVASCULAR: No chest pain, orthopnea, edema.  GASTROINTESTINAL: No nausea, vomiting, diarrhea or abdominal pain.  GENITOURINARY: No dysuria, hematuria.  ENDOCRINE: No polyuria, nocturia,  HEMATOLOGY: No anemia, easy bruising or bleeding SKIN: No rash or lesion. MUSCULOSKELETAL: No joint pain or arthritis.   NEUROLOGIC: No tingling, numbness, weakness. Positive headache  PSYCHIATRY: Positive anxiety , denies depression.   DRUG ALLERGIES:   Allergies  Allergen Reactions  . Codeine Nausea And Vomiting    VITALS:  Blood pressure 121/63, pulse 77, temperature 98.2 F (36.8 C), temperature source Oral, resp. rate 20, height _0  (1.6 m), weight 114.896 kg (253 lb 4.8 oz), SpO2 93 %.  PHYSICAL EXAMINATION:  GENERAL:  59 y.o.-year-old patient lying in the bed with no acute distress.  EYES: Pupils equal, round, reactive to light and accommodation. No scleral icterus. Extraocular muscles intact.  HEENT: Head atraumatic, normocephalic. Oropharynx and nasopharynx clear.  NECK:  Supple, no jugular venous distention. No thyroid enlargement, no tenderness.  LUNGS:  Improved air entry, decrease in wheezing CARDIOVASCULAR: S1, S2 normal. No murmurs, rubs, or gallops.  ABDOMEN: Soft, nontender, nondistended. Bowel sounds present. No organomegaly or mass.  EXTREMITIES: No pedal edema, cyanosis, or clubbing.  NEUROLOGIC: Cranial nerves II through XII are intact. Muscle strength 5/5 in all extremities. Sensation intact. Gait  not checked.  PSYCHIATRIC: The patient is alert and oriented x 3. Anxious SKIN: No obvious rash, lesion, or ulcer.    LABORATORY PANEL:   CBC  Recent Labs Lab 07/01/15 0428  WBC 13.2*  HGB 14.8  HCT 45.6  PLT 165   ------------------------------------------------------------------------------------------------------------------  Chemistries   Recent Labs Lab 06/29/15 0039  07/01/15 0428  NA 143  < > 141  K 3.5  < > 3.8  CL 106  < > 102  CO2 30  < > 33*  GLUCOSE 118*  < > 146*  BUN 20  < > 30*  CREATININE 0.77  < > 0.67  CALCIUM 9.4  < > 8.8*  AST 19  --   --   ALT 17  --   --   ALKPHOS 62  --   --   BILITOT 0.4  --   --   < > = values in this interval not displayed. ------------------------------------------------------------------------------------------------------------------  Cardiac Enzymes  Recent Labs Lab 06/29/15 1905  TROPONINI <0.03   ------------------------------------------------------------------------------------------------------------------  RADIOLOGY:  Dg Chest 2 View  07/01/2015   CLINICAL DATA:  Shortness of breath.  EXAM: CHEST  2 VIEW  COMPARISON:  June 29, 2015  FINDINGS: The heart size and mediastinal contours are stable. The heart size is enlarged. There is interval developed patchy consolidation of both lung bases at least in part due to atelectasis. Mild increased pulmonary interstitium is identified bilaterally unchanged. The visualized skeletal structures are stable.  IMPRESSION: Mild interstitial pulmonary edema unchanged compared to prior exam.  Interval developed patchy consolidation both lung bases at least in part due to atelectasis but underlying pneumonia is not excluded.   Electronically Signed  By: Abelardo Diesel M.D.   On: 07/01/2015 16:07    EKG:   Orders placed or performed during the hospital encounter of 06/29/15  . EKG 12-Lead  . EKG 12-Lead  . ED EKG  . ED EKG    ASSESSMENT AND PLAN:   59 year old  Caucasian female history of COPD and non-oxygen dependent presenting with shortness of breath  1. Acute on chronic diastolic congestive heart failure Continue on telemetry Continue aspirin therapy  Oral Lasix Continue daily weights, I's and O's, Appreciate  cardiology recommendations  2. Acute on chronic COPD acute exacerbation: Chronic oxygen at home  DuoNeb treatments supplemental oxygen   Prednisone taper and oral anabiotic's  Mucinex and Mucomyst seems to have helped  Spiriva on discharge  3. Essential hypertension: Continue home medications  4. History of depression and continue home medications Zoloft 150 mg once a day and Xanax  5. Tobacco abuse disorder Counseled patient to quit smoking   6. Venous thromboembolism prophylactic: Heparin subcutaneous       All the records are reviewed and case discussed with Care Management/Social Workerr. Management plans discussed with the patient, family and they are in agreement.  CODE STATUS: Full code  TOTAL TIME TAKING CARE OF THIS PATIENT: 25 minutes.       Dustin Flock M.D on 07/03/2015 at 12:57 PM  Between 7am to 6pm - Pager - [phone] After 6pm go to [email or web address] - password [email or web address] [facility]  [facility]  Office  [phone]  CC: Primary care physician; [name], PA

## 2015-07-03 NOTE — Care Management Important Message (Signed)
Important Message  Patient Details  Name: Theresa [name] MRN: [id number] Date of Birth: 03-06-1956   Medicare Important Message Given:  Yes-fourth notification given    Juliann Pulse A [place]mond 07/03/2015, 10:45 AM

## 2015-07-04 LAB — CULTURE, BLOOD (ROUTINE X 2)
Culture: NO GROWTH
Culture: NO GROWTH

## 2015-07-04 MED ORDER — TIOTROPIUM BROMIDE MONOHYDRATE 18 MCG IN CAPS: 18.0000 ug | ORAL_CAPSULE | Freq: Every day | RESPIRATORY_TRACT | Status: DC

## 2015-07-04 MED ORDER — FUROSEMIDE 20 MG PO TABS: 20.0000 mg | ORAL_TABLET | ORAL | Status: DC

## 2015-07-04 MED ORDER — PREDNISONE 10 MG PO TABS: ORAL_TABLET | ORAL | Status: DC

## 2015-07-04 MED ORDER — LEVOFLOXACIN 500 MG PO TABS: 500.0000 mg | ORAL_TABLET | Freq: Every day | ORAL | Status: DC

## 2015-07-04 MED ORDER — BUTALBITAL-APAP-CAFFEINE 50-325-40 MG PO TABS
1.0000 | ORAL_TABLET | Freq: Once | ORAL | Status: AC
  Administered 2015-07-04: 1 via ORAL
  Filled 2015-07-04: qty 1

## 2015-07-04 MED ORDER — ALPRAZOLAM 0.5 MG PO TABS: 0.5000 mg | ORAL_TABLET | Freq: Every evening | ORAL | Status: DC | PRN

## 2015-07-04 MED ORDER — NICOTINE 21 MG/24HR TD PT24: 21.0000 mg | MEDICATED_PATCH | Freq: Every day | TRANSDERMAL | Status: DC

## 2015-07-04 NOTE — Care Management Note (Signed)
Case Management Note  Patient Details  Name: Theresa [name] MRN: [id number] Date of Birth: May 10, 1956  Subjective/Objective:   Referral for home health PT and RN faxed to [facility].                 Action/Plan:   Expected Discharge Date:                  Expected Discharge Plan:     In-House Referral:     Discharge planning Services     Post Acute Care Choice:    Choice offered to:     DME Arranged:    DME Agency:     HH Arranged:    [place] Agency:     Status of Service:     Medicare Important Message Given:  Yes-fourth notification given Date Medicare IM Given:    Medicare IM give by:    Date Additional Medicare IM Given:    Additional Medicare Important Message give by:     If discussed at [place] of Stay Meetings, dates discussed:    Additional Comments:  [name] A, RN 07/04/2015, 12:16 PM

## 2015-07-04 NOTE — Care Management Note (Signed)
Case Management Note  Patient Details  Name: Theresa [name] MRN: [id number] Date of Birth: 07-27-1956  Subjective/Objective:     Faxed referral for home health RN and PT  to [facility]. Also left a voice message on the phone of the on-call person at [facility] requesting a call back if they did not receive the faxed referral. .            Action/Plan:   Expected Discharge Date:                  Expected Discharge Plan:     In-House Referral:     Discharge planning Services     Post Acute Care Choice:    Choice offered to:     DME Arranged:    DME Agency:     HH Arranged:    [place] Agency:     Status of Service:     Medicare Important Message Given:  Yes-fourth notification given Date Medicare IM Given:    Medicare IM give by:    Date Additional Medicare IM Given:    Additional Medicare Important Message give by:     If discussed at [place] of Stay Meetings, dates discussed:    Additional Comments:  Nikai Quest A, RN 07/04/2015, 2:23 PM

## 2015-07-04 NOTE — Discharge Summary (Signed)
[place] [facility] [place] NAME: Theresa [name]    MR#:  [id number]  DATE OF BIRTH:  09/03/1956  DATE OF ADMISSION:  06/29/2015 ADMITTING PHYSICIAN: Lytle Butte, MD  DATE OF DISCHARGE: 07/04/15  PRIMARY CARE PHYSICIAN: [name], PA    ADMISSION DIAGNOSIS:  SOB (shortness of breath) [R06.02] COPD with exacerbation [J44.1]  DISCHARGE DIAGNOSIS:  Acute on chronic CHF,diastolic COPD with ongoing tobacco abuse Chronic respiratory failure on home oxygen Chronic pain  SECONDARY DIAGNOSIS:   Past Medical History  Diagnosis Date  . COPD (chronic obstructive pulmonary disease)   . Hypertension   . Gastritis   . Lumbar radiculitis   . Muscle weakness   . DDD (degenerative disc disease), cervical   . Depression   . Chronic rhinitis   . Chronic generalized abdominal pain   . Anxiety   . Edema   . Carpal tunnel syndrome   . Low back pain   . Hematuria   . Malodorous urine   . Sensory urge incontinence   . Obesity   . Renal cyst   . Tobacco abuse     HOSPITAL COURSE:   59 year old Caucasian female history of COPD and non-oxygen dependent presenting with shortness of breath  1. Acute on chronic diastolic congestive heart failure Continue on telemetry Continue aspirin therapy  Oral Lasix qod. Pt uop about 12 liters. Now back on 3 liter [place] Continue daily weights, I's and O's, Appreciate cardiology recommendations -troponins neg CHF heart failure appt noted  2. Acute on chronic COPD acute exacerbation: Chronic oxygen at home DuoNeb treatments supplemental oxygen  Prednisone taper and oral anabiotic's Mucinex and Mucomyst seems to have helped Spiriva on discharge  3. Essential hypertension: Continue home medications  4. History of depression and continue home medications Zoloft 150 mg once a day and Xanax  5. Tobacco abuse disorder Counseled patient to quit smoking   6. Venous thromboembolism prophylactic:  Heparin subcutaneous  Overall at baseline. Lot of issues with chronic pain. Gave few xanax to help her with anxiety. D/c home with [facility]  DISCHARGE CONDITIONS:   fair  CONSULTS OBTAINED:  Isaias Cowman, MD Yolonda Kida, MD  DRUG ALLERGIES:   Allergies  Allergen Reactions  . Codeine Nausea And Vomiting    DISCHARGE MEDICATIONS:   Current Discharge Medication List    START taking these medications   Details  ALPRAZolam (XANAX) 0.5 MG tablet Take 1 tablet (0.5 mg total) by mouth at bedtime as needed for anxiety. Qty: 20 tablet, Refills: 0    furosemide (LASIX) 20 MG tablet Take 1 tablet (20 mg total) by mouth every other day. Qty: 30 tablet, Refills: 0    levofloxacin (LEVAQUIN) 500 MG tablet Take 1 tablet (500 mg total) by mouth daily at 3 pm. Qty: 5 tablet, Refills: 0    nicotine (NICODERM CQ - DOSED IN MG/24 HOURS) 21 mg/24hr patch Place 1 patch (21 mg total) onto the skin daily. Qty: 28 patch, Refills: 0    tiotropium (SPIRIVA) 18 MCG inhalation capsule Place 1 capsule (18 mcg total) into inhaler and inhale daily. Qty: 30 capsule, Refills: 12      CONTINUE these medications which have CHANGED   Details  predniSONE (DELTASONE) 10 MG tablet Take 50 mg daily taper by 10 mg daily then stop Qty: 15 tablet, Refills: 0      CONTINUE these medications which have NOT CHANGED   Details  albuterol (PROVENTIL HFA;VENTOLIN HFA) 108 (90  BASE) MCG/ACT inhaler Inhale 2 puffs into the lungs every 6 (six) hours as needed for wheezing.    Aspirin-Caffeine (BC FAST PAIN RELIEF ARTHRITIS) 1000-65 MG PACK Take 1 packet by mouth every 6 (six) hours as needed (Pain).    bisoprolol-hydrochlorothiazide (ZIAC) 10-6.25 MG per tablet TAKE 1 TABLET BY MOUTH DAILY Qty: 90 tablet, Refills: 1    budesonide-formoterol (SYMBICORT) 160-4.5 MCG/ACT inhaler Inhale 2 puffs into the lungs 2 (two) times daily.    estradiol (ESTRACE) 1 MG tablet Take 1 mg by mouth daily.    fesoterodine  (TOVIAZ) 8 MG TB24 tablet Take 8 mg by mouth daily.    ipratropium-albuterol (DUONEB) 0.5-2.5 (3) MG/3ML SOLN Take 3 mLs by nebulization every 6 (six) hours. Qty: 360 mL, Refills: 0    NON FORMULARY Apply 1 application topically 4 (four) times daily as needed (joint/ muscle pain). Perform Cool & Soothing Pain Relieving Roll-On (Menthol 3.5% topical)    roflumilast (DALIRESP) 500 MCG TABS tablet Take 500 mcg by mouth daily.    chlorpheniramine-HYDROcodone (TUSSIONEX PENNKINETIC ER) 10-8 MG/5ML SUER Take 5 mLs by mouth every 12 (twelve) hours as needed for cough. Qty: 115 mL, Refills: 0    famciclovir (FAMVIR) 500 MG tablet Take 1 tablet (500 mg total) by mouth 3 (three) times daily. Qty: 21 tablet, Refills: 0   Associated Diagnoses: Herpes zoster    HYDROcodone-acetaminophen (NORCO/VICODIN) 5-325 MG per tablet Take 1 tablet by mouth every 6 (six) hours as needed for moderate pain. Qty: 30 tablet, Refills: 0   Associated Diagnoses: Right-sided low back pain without sciatica      STOP taking these medications     sertraline (ZOLOFT) 100 MG tablet         If you experience worsening of your admission symptoms, develop shortness of breath, life threatening emergency, suicidal or homicidal thoughts you must seek medical attention immediately by calling 911 or calling your MD immediately  if symptoms less severe.  You Must read complete instructions/literature along with all the possible adverse reactions/side effects for all the Medicines you take and that have been prescribed to you. Take any new Medicines after you have completely understood and accept all the possible adverse reactions/side effects.   Please note  You were cared for by a hospitalist during your hospital stay. If you have any questions about your discharge medications or the care you received while you were in the hospital after you are discharged, you can call the unit and asked to speak with the hospitalist on call if  the hospitalist that took care of you is not available. Once you are discharged, your primary care physician will handle any further medical issues. Please note that NO REFILLS for any discharge medications will be authorized once you are discharged, as it is imperative that you return to your primary care physician (or establish a relationship with a primary care physician if you do not have one) for your aftercare needs so that they can reassess your need for medications and monitor your lab values. Today   SUBJECTIVE   headache  VITAL SIGNS:  Blood pressure 112/60, pulse 63, temperature 98.1 F (36.7 C), temperature source Oral, resp. rate 20, height _0  (1.6 m), weight 115.032 kg (253 lb 9.6 oz), SpO2 98 %.  I/O:   Intake/Output Summary (Last 24 hours) at 07/04/15 1058 Last data filed at 07/04/15 1028  Gross per 24 hour  Intake    360 ml  Output   2900 ml  Net  -2540 ml    PHYSICAL EXAMINATION:  GENERAL:  59 y.o.-year-old patient lying in the bed with no acute distress.  EYES: Pupils equal, round, reactive to light and accommodation. No scleral icterus. Extraocular muscles intact.  HEENT: Head atraumatic, normocephalic. Oropharynx and nasopharynx clear.  NECK:  Supple, no jugular venous distention. No thyroid enlargement, no tenderness.  LUNGS: distant breath sounds bilaterally, mild wheezing, no rales,rhonchi or crepitation. No use of accessory muscles of respiration.  CARDIOVASCULAR: S1, S2 normal. No murmurs, rubs, or gallops.  ABDOMEN: Soft, non-tender, non-distended. Bowel sounds present. No organomegaly or mass.  EXTREMITIES: No pedal edema, cyanosis, or clubbing.  NEUROLOGIC: Cranial nerves II through XII are intact. Muscle strength 5/5 in all extremities. Sensation intact. Gait not checked.  PSYCHIATRIC:  patient is alert and oriented x 3.  SKIN: No obvious rash, lesion, or ulcer.   DATA REVIEW:   CBC   Recent Labs Lab 07/01/15 0428  WBC 13.2*  HGB 14.8  HCT  45.6  PLT 165    Chemistries   Recent Labs Lab 06/29/15 0039  07/01/15 0428  NA 143  < > 141  K 3.5  < > 3.8  CL 106  < > 102  CO2 30  < > 33*  GLUCOSE 118*  < > 146*  BUN 20  < > 30*  CREATININE 0.77  < > 0.67  CALCIUM 9.4  < > 8.8*  AST 19  --   --   ALT 17  --   --   ALKPHOS 62  --   --   BILITOT 0.4  --   --   < > = values in this interval not displayed.  Microbiology Results   Recent Results (from the past 240 hour(s))  Blood culture (routine x 2)     Status: None   Collection Time: 06/29/15 12:56 AM  Result Value Ref Range Status   Specimen Description BLOOD RIGHT HAND  Final   Special Requests BOTTLES DRAWN AEROBIC AND ANAEROBIC 4CC  Final   Culture NO GROWTH 5 DAYS  Final   Report Status 07/04/2015 FINAL  Final  Blood culture (routine x 2)     Status: None   Collection Time: 06/29/15 12:58 AM  Result Value Ref Range Status   Specimen Description BLOOD LEFT ASSIST CONTROL  Final   Special Requests BOTTLES DRAWN AEROBIC AND ANAEROBIC 4CC  Final   Culture NO GROWTH 5 DAYS  Final   Report Status 07/04/2015 FINAL  Final    RADIOLOGY:  No results found.   Management plans discussed with the patient, family and they are in agreement.  CODE STATUS:     Code Status Orders        Start     Ordered   06/29/15 0312  Full code   Continuous     06/29/15 0312      TOTAL TIME TAKING CARE OF THIS PATIENT: 40 minutes.    PATEL,SONA M.D on 07/04/2015 at 10:58 AM  Between 7am to 6pm - Pager - [phone] After 6pm go to [email or web address] - password [email or web address] [facility]  [place] [facility]  Office  [phone]  CC: Primary care physician; [name], PA

## 2015-07-04 NOTE — Discharge Instructions (Signed)
Heart Failure Clinic appointment on July 15, 2015 at 11:00am with Darylene Price, [place]. Please call [phone] to reschedule.   REFERRAL HAS BEEN MADE FOR HOME HEALTH NURSING, PHYSCIAL THERAPY, OCCUPATIONAL THERAY AND AIDE TO [facility].  [phone].  [facility] WILL CONTACT TO ARRANGE FOR INITIAL VISIT.  CALL [facility]ENC[facility] IF HAVE NOT HEARD FROM THEM WITHIN 24 HOURS  REFERRAL HAS BEEN MADE TO [facility])  [phone]   STOP SMOKING!!

## 2015-07-04 NOTE — Care Management Note (Signed)
Case Management Note  Patient Details  Name: Natelie Ostrosky MRN: [id number] Date of Birth: 1956-08-07  Subjective/Objective:    Referral for home health PT and RN faxed to [facility].  Ms Hedberg already has chronic home oxygen.             Action/Plan:   Expected Discharge Date:                  Expected Discharge Plan:     In-House Referral:     Discharge planning Services     Post Acute Care Choice:    Choice offered to:     DME Arranged:    DME Agency:     HH Arranged:    [place] Agency:     Status of Service:     Medicare Important Message Given:  Yes-fourth notification given Date Medicare IM Given:    Medicare IM give by:    Date Additional Medicare IM Given:    Additional Medicare Important Message give by:     If discussed at [place] of Stay Meetings, dates discussed:    Additional Comments:  Rockett,Marilyn A, RN 07/04/2015, 2:13 PM

## 2015-07-04 NOTE — Progress Notes (Signed)
Pt to be discharged this afternoon. Iv and tele removed. disch instructions and prescrips given to pt. disch via w.c. Accompanied by family

## 2015-07-06 ENCOUNTER — Other Ambulatory Visit: Payer: Self-pay | Admitting: *Deleted

## 2015-07-06 NOTE — Patient Outreach (Signed)
Initial telephone transition of care call made. Pt has new dx of HF. She got some information in the hospital but needs review and reinforcement. I educated her that HF is a chronic illness and it will never go away but she can manage it. I reviewed the daily self management principles of weighing daily, avoiding salt, taking meds, exercise as tolerated and increase as endurance improves. See your primary care and cardiologist soon, call for appts tomorrow. Pt does have a scale and is weighing. She has not added salt to food for a week. She is a new nonsmoker X 1 week so she needs reinforcement and support to maintain abstinence. I emphasized early access to medical care for any signs of a problem to avoid rehospitalization.  I have advised her that my partner, Kathie Rhodes, RN will be contacting her next week for her second transition of care call. I gave her the 24 hour nurse line.   [name] [facility] [place] [phone]

## 2015-07-07 ENCOUNTER — Telehealth: Payer: Self-pay | Admitting: Family Medicine

## 2015-07-07 NOTE — Telephone Encounter (Signed)
Not Dr. Alben Spittle patient. Simona Huh please review.

## 2015-07-07 NOTE — Telephone Encounter (Signed)
Left message for Theresa [name] with [facility] to proceed with home health care assessment with diagnoses of acute on chronic diastolic heart failure, chronic respiratory failure on continuous oxygen and severe COPD with ongoing tobacco abuse.

## 2015-07-07 NOTE — Telephone Encounter (Signed)
Tabitha with [facility] called to request verbal orders to treat for home health.  CB#[phone]/MW

## 2015-07-09 ENCOUNTER — Emergency Department: Payer: Medicare PPO

## 2015-07-09 ENCOUNTER — Telehealth: Payer: Self-pay | Admitting: Family Medicine

## 2015-07-09 ENCOUNTER — Encounter: Payer: Self-pay | Admitting: *Deleted

## 2015-07-09 ENCOUNTER — Emergency Department
Admission: EM | Admit: 2015-07-09 | Discharge: 2015-07-10 | Disposition: A | Discharge status: Home / Self Care | Payer: Medicare PPO | Attending: Emergency Medicine | Admitting: Emergency Medicine

## 2015-07-09 DIAGNOSIS — Z7952 Long term (current) use of systemic steroids: Secondary | ICD-10-CM | POA: Insufficient documentation

## 2015-07-09 DIAGNOSIS — Z792 Long term (current) use of antibiotics: Secondary | ICD-10-CM | POA: Diagnosis not present

## 2015-07-09 DIAGNOSIS — Z7982 Long term (current) use of aspirin: Secondary | ICD-10-CM | POA: Diagnosis not present

## 2015-07-09 DIAGNOSIS — J441 Chronic obstructive pulmonary disease with (acute) exacerbation: Secondary | ICD-10-CM | POA: Diagnosis not present

## 2015-07-09 DIAGNOSIS — Z7951 Long term (current) use of inhaled steroids: Secondary | ICD-10-CM | POA: Diagnosis not present

## 2015-07-09 DIAGNOSIS — Z79818 Long term (current) use of other agents affecting estrogen receptors and estrogen levels: Secondary | ICD-10-CM | POA: Diagnosis not present

## 2015-07-09 DIAGNOSIS — Z79899 Other long term (current) drug therapy: Secondary | ICD-10-CM | POA: Insufficient documentation

## 2015-07-09 DIAGNOSIS — I10 Essential (primary) hypertension: Secondary | ICD-10-CM | POA: Diagnosis not present

## 2015-07-09 DIAGNOSIS — M79604 Pain in right leg: Secondary | ICD-10-CM | POA: Diagnosis present

## 2015-07-09 DIAGNOSIS — Z720 Tobacco use: Secondary | ICD-10-CM | POA: Insufficient documentation

## 2015-07-09 DIAGNOSIS — J9611 Chronic respiratory failure with hypoxia: Secondary | ICD-10-CM | POA: Diagnosis not present

## 2015-07-09 HISTORY — DX: Heart failure, unspecified: I50.9

## 2015-07-09 LAB — CBC
HCT: 47 % (ref 35.0–47.0)
Hemoglobin: 15.5 g/dL (ref 12.0–16.0)
MCH: 32.6 pg (ref 26.0–34.0)
MCHC: 32.9 g/dL (ref 32.0–36.0)
MCV: 99.1 fL (ref 80.0–100.0)
Platelets: 150 10*3/uL (ref 150–440)
RBC: 4.75 MIL/uL (ref 3.80–5.20)
RDW: 14 % (ref 11.5–14.5)
WBC: 15.8 10*3/uL — ABNORMAL HIGH (ref 3.6–11.0)

## 2015-07-09 LAB — BASIC METABOLIC PANEL
Anion gap: 7 (ref 5–15)
BUN: 23 mg/dL — ABNORMAL HIGH (ref 6–20)
CO2: 30 mmol/L (ref 22–32)
Calcium: 9 mg/dL (ref 8.9–10.3)
Chloride: 102 mmol/L (ref 101–111)
Creatinine, Ser: 0.94 mg/dL (ref 0.44–1.00)
GFR calc Af Amer: >60 mL/min (ref >60)
GFR calc non Af Amer: >60 mL/min (ref >60)
Glucose, Bld: 116 mg/dL — ABNORMAL HIGH (ref 65–99)
Potassium: 4.2 mmol/L (ref 3.5–5.1)
Sodium: 139 mmol/L (ref 135–145)

## 2015-07-09 LAB — TROPONIN I: Troponin I: <0.03 ng/mL (ref <0.031)

## 2015-07-09 MED ORDER — IPRATROPIUM-ALBUTEROL 0.5-2.5 (3) MG/3ML IN SOLN
3.0000 mL | Freq: Once | RESPIRATORY_TRACT | Status: AC
  Administered 2015-07-09: 3 mL via RESPIRATORY_TRACT
  Filled 2015-07-09: qty 3

## 2015-07-09 NOTE — Telephone Encounter (Signed)
error

## 2015-07-09 NOTE — ED Notes (Signed)
Pt reports lower extremity edema and SOB x 2 days.  Pt HX CHF and COPD.  Pt with larger area of swelling on superior aspect of right knee, tender to touch.  Pt's right ankle tender to touch.  Edema worse in right leg.  Pt reports intense pain when walking.

## 2015-07-09 NOTE — Telephone Encounter (Signed)
Theresa [name] from [facility] home health is requesting verbal orders for PT 3 times a week for 2 weeks and twice a week for 3 weeks. Thanks TNP

## 2015-07-09 NOTE — ED Notes (Signed)
Pt has swelling to right ankle and lower leg.  Pt has sob.  Pt is on 2 liters for copd/chf. No chest pain. Sx began yesterday

## 2015-07-10 ENCOUNTER — Ambulatory Visit
Admission: RE | Admit: 2015-07-10 | Discharge: 2015-07-10 | Disposition: A | Discharge status: Home / Self Care | Payer: Medicare PPO | Source: Ambulatory Visit | Attending: Family Medicine | Admitting: Family Medicine

## 2015-07-10 ENCOUNTER — Encounter: Payer: Self-pay | Admitting: Emergency Medicine

## 2015-07-10 ENCOUNTER — Other Ambulatory Visit: Payer: Self-pay

## 2015-07-10 ENCOUNTER — Ambulatory Visit (INDEPENDENT_AMBULATORY_CARE_PROVIDER_SITE_OTHER): Payer: Medicare PPO | Admitting: Family Medicine

## 2015-07-10 ENCOUNTER — Encounter: Payer: Self-pay | Admitting: Family Medicine

## 2015-07-10 VITALS — BP 116/74 | HR 97 | Temp 98.1°F | Resp 20 | Wt 257.6 lb

## 2015-07-10 DIAGNOSIS — R312 Other microscopic hematuria: Secondary | ICD-10-CM

## 2015-07-10 DIAGNOSIS — J9611 Chronic respiratory failure with hypoxia: Secondary | ICD-10-CM | POA: Diagnosis not present

## 2015-07-10 DIAGNOSIS — I5033 Acute on chronic diastolic (congestive) heart failure: Secondary | ICD-10-CM | POA: Diagnosis not present

## 2015-07-10 DIAGNOSIS — F411 Generalized anxiety disorder: Secondary | ICD-10-CM

## 2015-07-10 DIAGNOSIS — M25561 Pain in right knee: Secondary | ICD-10-CM

## 2015-07-10 DIAGNOSIS — R3129 Other microscopic hematuria: Secondary | ICD-10-CM

## 2015-07-10 DIAGNOSIS — D72829 Elevated white blood cell count, unspecified: Secondary | ICD-10-CM

## 2015-07-10 MED ORDER — SERTRALINE HCL 50 MG PO TABS: 50.0000 mg | ORAL_TABLET | Freq: Every day | ORAL | Status: DC

## 2015-07-10 MED ORDER — PREDNISONE 20 MG PO TABS
ORAL_TABLET | ORAL | Status: AC
  Administered 2015-07-10: 40 mg via ORAL
  Filled 2015-07-10: qty 2

## 2015-07-10 MED ORDER — OXYCODONE-ACETAMINOPHEN 5-325 MG PO TABS: 1.0000 | ORAL_TABLET | Freq: Four times a day (QID) | ORAL | Status: DC | PRN

## 2015-07-10 MED ORDER — NITROFURANTOIN MONOHYD MACRO 100 MG PO CAPS: 100.0000 mg | ORAL_CAPSULE | Freq: Two times a day (BID) | ORAL | Status: DC

## 2015-07-10 MED ORDER — KETOROLAC TROMETHAMINE 30 MG/ML IJ SOLN
30.0000 mg | Freq: Once | INTRAMUSCULAR | Status: AC
  Administered 2015-07-10: 30 mg via INTRAVENOUS

## 2015-07-10 MED ORDER — OXYCODONE-ACETAMINOPHEN 5-325 MG PO TABS
1.0000 | ORAL_TABLET | Freq: Once | ORAL | Status: AC
  Administered 2015-07-10: 1 via ORAL
  Filled 2015-07-10: qty 1

## 2015-07-10 MED ORDER — PREDNISONE 20 MG PO TABS
40.0000 mg | ORAL_TABLET | Freq: Once | ORAL | Status: AC
  Administered 2015-07-10: 40 mg via ORAL

## 2015-07-10 MED ORDER — KETOROLAC TROMETHAMINE 30 MG/ML IJ SOLN
INTRAMUSCULAR | Status: AC
  Administered 2015-07-10: 30 mg via INTRAVENOUS
  Filled 2015-07-10: qty 1

## 2015-07-10 NOTE — Progress Notes (Signed)
Patient ID: Theresa [name], female   DOB: Jan 25, 1956, 59 y.o.   MRN: [id number]  Chief Complaint  Patient presents with  . Hospitalization Follow-up  . Congestive Heart Failure  . COPD     Subjective:  Congestive Heart Failure Presents for follow-up visit. Associated symptoms include edema, fatigue and shortness of breath. Pertinent negatives include no chest pain or chest pressure. The symptoms have been stable. Past treatments include salt and fluid restriction. The treatment provided moderate relief. Compliance with prior treatments has been good.  Hospitalized on 06-29-15 and discharged on 07-04-15 with diagnoses of acute on chronic diastolic CHF, chronic respiratory failure on home oxygen and COPD with ongoing tobacco use (restarted after hospitalization). Weight had gone up to 258 lbs and down to 253 with diuresis. Was given Heparin during the hospitalization for thromboembolism prevention. WBC count was up to 14,000 with negative blood cultures. Treated with prednisone taper and Levaquin at discharge with Lasix 20 mg qod. Returned to the ER last night due to pain and swelling in the right leg. CXR did not show infiltrates or pleural effusions. Doppler was negative for DVT. Given Percocet and discharged.  Continues to have swelling and throbbing pain in the right lateral distal thigh. No known injury or fall.   Prior to Admission medications   Medication Sig Start Date End Date Taking? Authorizing Pio Eatherly  ADVAIR DISKUS 250-50 MCG/DOSE AEPB  05/15/15  Yes Historical Davanna He, MD  albuterol (PROVENTIL HFA;VENTOLIN HFA) 108 (90 BASE) MCG/ACT inhaler Inhale 2 puffs into the lungs every 6 (six) hours as needed for wheezing.   Yes Historical Lawson Mahone, MD  ALPRAZolam Duanne Moron) 0.5 MG tablet Take 1 tablet (0.5 mg total) by mouth at bedtime as needed for anxiety. 07/04/15  Yes Fritzi Mandes, MD  Aspirin-Caffeine (BC FAST PAIN RELIEF ARTHRITIS) 1000-65 MG PACK Take 1 packet by mouth every 6 (six) hours as  needed (Pain).   Yes Historical Ramses Klecka, MD  bisoprolol-hydrochlorothiazide [facility]) 10-6.25 MG per tablet TAKE 1 TABLET BY MOUTH DAILY 05/31/15  Yes Vickki Muff Chrismon, PA  budesonide-formoterol (SYMBICORT) 160-4.5 MCG/ACT inhaler Inhale 2 puffs into the lungs 2 (two) times daily.   Yes Historical Kenecia Barren, MD  chlorpheniramine-HYDROcodone (TUSSIONEX PENNKINETIC ER) 10-8 MG/5ML SUER Take 5 mLs by mouth every 12 (twelve) hours as needed for cough. 03/01/15  Yes Frederich Cha, MD  estradiol (ESTRACE) 1 MG tablet Take 1 mg by mouth daily.   Yes Historical Anayah Arvanitis, MD  famciclovir (FAMVIR) 500 MG tablet Take 1 tablet (500 mg total) by mouth 3 (three) times daily. 05/05/15  Yes Dennis E Chrismon, PA  fesoterodine (TOVIAZ) 8 MG TB24 tablet Take 8 mg by mouth daily.   Yes Historical Salah Burlison, MD  furosemide (LASIX) 20 MG tablet Take 1 tablet (20 mg total) by mouth every other day. 07/04/15  Yes Fritzi Mandes, MD  HYDROcodone-acetaminophen (NORCO/VICODIN) 5-325 MG per tablet Take 1 tablet by mouth every 6 (six) hours as needed for moderate pain. 05/05/15  Yes Dennis E Chrismon, PA  ipratropium-albuterol (DUONEB) 0.5-2.5 (3) MG/3ML SOLN Take 3 mLs by nebulization every 6 (six) hours. 04/21/15  Yes Srikar Sudini, MD  nicotine (NICODERM CQ - DOSED IN MG/24 HOURS) 21 mg/24hr patch Place 1 patch (21 mg total) onto the skin daily. 07/04/15  Yes Fritzi Mandes, MD  NON FORMULARY Apply 1 application topically 4 (four) times daily as needed (joint/ muscle pain). Perform Cool & Soothing Pain Relieving Roll-On (Menthol 3.5% topical)   Yes Historical Haylee Mcanany, MD  oxyCODONE-acetaminophen (ROXICET) 5-325 MG  per tablet Take 1 tablet by mouth every 6 (six) hours as needed for severe pain. 07/10/15  Yes Carrie Mew, MD  roflumilast (DALIRESP) 500 MCG TABS tablet Take 500 mcg by mouth daily.   Yes Historical Deeana Atwater, MD  tiotropium (SPIRIVA) 18 MCG inhalation capsule Place 1 capsule (18 mcg total) into inhaler and inhale daily. 07/04/15   Yes Fritzi Mandes, MD   Past Surgical History  Procedure Laterality Date  . Tubal ligation    . Abdominal hysterectomy    . Tonsillectomy      Patient Active Problem List   Diagnosis Date Noted  . Acute on chronic diastolic congestive heart failure 06/29/2015  . Respiratory insufficiency 06/29/2015  . Chronic respiratory failure with hypoxia 04/28/2015  . Tendinitis 04/21/2015  . BP (high blood pressure) 04/21/2015  . Blood clotting disorder 04/21/2015  . Chronic respiratory acidosis 04/18/2015  . Abdominal pain, periumbilical 18/84/1660  . Abdominal pain, generalized 07/17/2014  . Hypoventilation, idiopathic 07/16/2014  . Decreased motor strength 07/16/2014  . Accumulation of fluid in tissues 07/16/2014  . Soft tissue lesion of shoulder region 07/16/2014  . Clinical depression 07/16/2014  . Chronic rhinitis 07/16/2014  . Carpal tunnel syndrome 07/16/2014  . Anxiety state 07/16/2014  . Neuritis or radiculitis due to rupture of lumbar intervertebral disc 07/02/2014  . DDD (degenerative disc disease), lumbar 07/02/2014  . Adiposity 02/27/2014  . Nicotine addiction 02/27/2014  . Abnormal result of cardiovascular function study 02/27/2014  . CAFL (chronic airflow limitation) 02/27/2014  . Herpes zona 07/16/2009  . Essential (primary) hypertension 03/27/2008    Past Medical History  Diagnosis Date  . COPD (chronic obstructive pulmonary disease)   . Hypertension   . Gastritis   . Lumbar radiculitis   . Muscle weakness   . DDD (degenerative disc disease), cervical   . Depression   . Chronic rhinitis   . Chronic generalized abdominal pain   . Anxiety   . Edema   . Carpal tunnel syndrome   . Low back pain   . Hematuria   . Malodorous urine   . Sensory urge incontinence   . Obesity   . Renal cyst   . Tobacco abuse   . CHF (congestive heart failure)     Social History   Social History  . Marital Status: Married    Spouse Name: N/A  . Number of Children: N/A  .  Years of Education: N/A   Occupational History  . Not on file.   Social History Main Topics  . Smoking status: Current Every Day Smoker -- 1.00 packs/day for 40 years    Types: Cigarettes  . Smokeless tobacco: Never Used     Comment: 30+ years, pt states she has cut back to 4-5 cigarettes a day  . Alcohol Use: No  . Drug Use: No  . Sexual Activity: No   Other Topics Concern  . Not on file   Social History Narrative   Lives with her daughter    Allergies  Allergen Reactions  . Codeine Nausea And Vomiting    Review of Systems  Constitutional: Positive for fatigue.  HENT: Negative.   Eyes: Negative.   Respiratory: Positive for shortness of breath.   Cardiovascular: Positive for leg swelling. Negative for chest pain.  Gastrointestinal: Negative.   Genitourinary: Negative.   Musculoskeletal: Negative.   Skin: Negative.   Neurological: Negative.   Endo/Heme/Allergies: Negative.   Psychiatric/Behavioral: Negative.     Immunization History  Administered Date(s) Administered  . Influenza,inj,Quad  PF,36+ Mos 07/01/2015  . Pneumococcal Polysaccharide-23 07/01/2015   Objective:  BP 116/74 mmHg  Pulse 97  Temp(Src) 98.1 F (36.7 C) (Oral)  Resp 20  Wt 257 lb 9.6 oz (116.847 kg)  SpO2 92%  Physical Exam  Constitutional: She is oriented to person, place, and time.  Chronic dyspnea on oxygen by nasal cannula continuously and obese.  HENT:  Head: Normocephalic and atraumatic.  Eyes: Conjunctivae and EOM are normal.  Neck: Normal range of motion. Neck supple. No JVD present.  Cardiovascular: Normal rate and regular rhythm.   Pulmonary/Chest: She has wheezes.  Dyspnea with rhonchi that clears with cough.   Abdominal: Bowel sounds are normal.  Musculoskeletal:  Throbbing pain and swelling in the right lateral distal thigh. No bruising or excoriations. Good pulses with some distal edema bilaterally. Increase discomfort to test ROM. No click or locking in knee.    Neurological: She is alert and oriented to person, place, and time.  Psychiatric: Memory and judgment normal. Her mood appears anxious. She is agitated.    Lab Results  Component Value Date   WBC 15.8* 07/09/2015   HGB 15.5 07/09/2015   HCT 47.0 07/09/2015   PLT 150 07/09/2015   GLUCOSE 116* 07/09/2015   TSH 0.993 04/18/2015    CMP     Component Value Date/Time   NA 139 07/09/2015 2327   NA 141 02/02/2015 0415   K 4.2 07/09/2015 2327   K 3.9 02/02/2015 0415   CL 102 07/09/2015 2327   CL 105 02/02/2015 0415   CO2 30 07/09/2015 2327   CO2 30 02/02/2015 0415   GLUCOSE 116* 07/09/2015 2327   GLUCOSE 108* 02/02/2015 0415   BUN 23* 07/09/2015 2327   BUN 19 02/02/2015 0415   CREATININE 0.94 07/09/2015 2327   CREATININE 0.70 02/02/2015 0415   CALCIUM 9.0 07/09/2015 2327   CALCIUM 8.7* 02/02/2015 0415   PROT 6.7 06/29/2015 0039   PROT 7.6 06/12/2014 2003   ALBUMIN 3.8 06/29/2015 0039   ALBUMIN 3.7 06/12/2014 2003   AST 19 06/29/2015 0039   AST 14* 06/12/2014 2003   ALT 17 06/29/2015 0039   ALT 35 06/12/2014 2003   ALKPHOS 62 06/29/2015 0039   ALKPHOS 113 06/12/2014 2003   BILITOT 0.4 06/29/2015 0039   BILITOT 0.7 06/12/2014 2003   GFRNONAA >60 07/09/2015 2327   GFRNONAA >60 02/02/2015 0415   GFRNONAA >60 10/01/2014 1825   GFRAA >60 07/09/2015 2327   GFRAA >60 02/02/2015 0415   GFRAA >60 10/01/2014 1825    Assessment and Plan :  1. Acute on chronic diastolic congestive heart failure Hospitalized 06-29-15 yo 07-04-15 for worsening dyspnea and lower extremity edema. Was diuresed 5 lbs and improved.  2. Chronic respiratory failure with hypoxia Worsening dyspnea prior to hospitalization and required BiPAP treatment with oxygen. Followed by Dr. Raul Del for emphysema on 06-17-15. Continue continuous oxygen at 2-3 LPM with pulse oximetry 85-94% at home. Pulse oximetry 88-92% on 2 LPM oxygen by nasal cannula at rest today. Continue present regimen of inhalers and oxygen as  prescribed by Dr. Raul Del. Proceed with home health nurse assessment with the advent of CHF on top of chronic respiratory failure.  3. Right knee pain Developed right knee pain and leg swelling yesterday without known injury. Doppler studies did not show DVT with history of 5 day hospitalization for CHF and respiratory failure. Will get x-ray to rule out degenerative disease in the right knee. Was given prescription for 4 Roxicet that she gave  to me to shred. Rewrote the prescription for 24 tablets due to persistent and worsening leg pain. May need orthopedic referral pending x-ray report. - DG Knee Complete 4 Views Right - oxyCODONE-acetaminophen (ROXICET) 5-325 MG per tablet; Take 1 tablet by mouth every 6 (six) hours as needed for severe pain.  Dispense: 24 tablet; Refill: 0  4. Microscopic hematuria No dysuria but some hematuria on urinalysis today. No fever but had elevation of WBC count last night despite treatment with Levaquin and prednisone during hospitalization 06-29-15. Will start Macrobid and get urine culture. Increase fluid intake and recheck pending report. - Urine Culture - POCT urinalysis dipstick - nitrofurantoin, macrocrystal-monohydrate, (MACROBID) 100 MG capsule; Take 1 capsule (100 mg total) by mouth 2 (two) times daily.  Dispense: 14 capsule; Refill: 0  5. Elevated WBC count WBC count 14,000 during 06-29-15 hospitalization and increased to 15,800 in the ER last night despite recently finishing Levaquin and Prednisone for COPD with acute exacerbation. Possibly secondary to UTI. Ordered urine culture today. - nitrofurantoin, macrocrystal-monohydrate, (MACROBID) 100 MG capsule; Take 1 capsule (100 mg total) by mouth 2 (two) times daily.  Dispense: 14 capsule; Refill: 0  6. Anxiety state Worsening with chronic respiratory failure and CHF despite use to Xanax. Will add Sertraline and recheck in a month. - sertraline (ZOLOFT) 50 MG tablet; Take 1 tablet (50 mg total) by mouth  daily.  Dispense: 30 tablet; Refill: [facility] 07/10/2015 11:09 AM

## 2015-07-10 NOTE — Telephone Encounter (Signed)
Agree with PT 3 times a week for 2 weeks then twice a week for 3 weeks. Please send verbal order.

## 2015-07-10 NOTE — Telephone Encounter (Signed)
Verbal order given to Aldin with [facility] for PT.

## 2015-07-10 NOTE — Discharge Instructions (Signed)
Your chest x-ray, ultrasound of the right leg, and blood tests were unremarkable today. Continue your home medications and take pain medicine as needed. Follow up with your primary care doctor as scheduled tomorrow.  Chest Pain (Nonspecific) It is often hard to give a specific diagnosis for the cause of chest pain. There is always a chance that your pain could be related to something serious, such as a heart attack or a blood clot in the lungs. You need to follow up with your health care provider for further evaluation. CAUSES   Heartburn.  Pneumonia or bronchitis.  Anxiety or stress.  Inflammation around your heart (pericarditis) or lung (pleuritis or pleurisy).  A blood clot in the lung.  A collapsed lung (pneumothorax). It can develop suddenly on its own (spontaneous pneumothorax) or from trauma to the chest.  Shingles infection (herpes zoster virus). The chest wall is composed of bones, muscles, and cartilage. Any of these can be the source of the pain.  The bones can be bruised by injury.  The muscles or cartilage can be strained by coughing or overwork.  The cartilage can be affected by inflammation and become sore (costochondritis). DIAGNOSIS  Lab tests or other studies may be needed to find the cause of your pain. Your health care provider may have you take a test called an ambulatory electrocardiogram (ECG). An ECG records your heartbeat patterns over a 24-hour period. You may also have other tests, such as:  Transthoracic echocardiogram (TTE). During echocardiography, sound waves are used to evaluate how blood flows through your heart.  Transesophageal echocardiogram (TEE).  Cardiac monitoring. This allows your health care provider to monitor your heart rate and rhythm in real time.  Holter monitor. This is a portable device that records your heartbeat and can help diagnose heart arrhythmias. It allows your health care provider to track your heart activity for several  days, if needed.  Stress tests by exercise or by giving medicine that makes the heart beat faster. TREATMENT   Treatment depends on what may be causing your chest pain. Treatment may include:  Acid blockers for heartburn.  Anti-inflammatory medicine.  Pain medicine for inflammatory conditions.  Antibiotics if an infection is present.  You may be advised to change lifestyle habits. This includes stopping smoking and avoiding alcohol, caffeine, and chocolate.  You may be advised to keep your head raised (elevated) when sleeping. This reduces the chance of acid going backward from your stomach into your esophagus. Most of the time, nonspecific chest pain will improve within 2-3 days with rest and mild pain medicine.  HOME CARE INSTRUCTIONS   If antibiotics were prescribed, take them as directed. Finish them even if you start to feel better.  For the next few days, avoid physical activities that bring on chest pain. Continue physical activities as directed.  Do not use any tobacco products, including cigarettes, chewing tobacco, or electronic cigarettes.  Avoid drinking alcohol.  Only take medicine as directed by your health care provider.  Follow your health care provider's suggestions for further testing if your chest pain does not go away.  Keep any follow-up appointments you made. If you do not go to an appointment, you could develop lasting (chronic) problems with pain. If there is any problem keeping an appointment, call to reschedule. SEEK MEDICAL CARE IF:   Your chest pain does not go away, even after treatment.  You have a rash with blisters on your chest.  You have a fever. [place]  IF:   You have increased chest pain or pain that spreads to your arm, neck, jaw, back, or abdomen.  You have shortness of breath.  You have an increasing cough, or you cough up blood.  You have severe back or abdominal pain.  You feel nauseous or vomit.  You  have severe weakness.  You faint.  You have chills. This is an emergency. Do not wait to see if the pain will go away. Get medical help at once. Call your local emergency services (911 in [place] Do not drive yourself to the hospital. MAKE SURE YOU:   Understand these instructions.  Will watch your condition.  Will get help right away if you are not doing well or get worse. Document Released: 07/13/2005 Document Revised: 10/08/2013 Document Reviewed: 05/08/2008 [facility] Patient Information 2015 [place], [place]. This information is not intended to replace advice given to you by your health care provider. Make sure you discuss any questions you have with your health care provider.  Musculoskeletal Pain Musculoskeletal pain is muscle and boney aches and pains. These pains can occur in any part of the body. Your caregiver may treat you without knowing the cause of the pain. They may treat you if blood or urine tests, X-rays, and other tests were normal.  CAUSES There is often not a definite cause or reason for these pains. These pains may be caused by a type of germ (virus). The discomfort may also come from overuse. Overuse includes working out too hard when your body is not fit. Boney aches also come from weather changes. Bone is sensitive to atmospheric pressure changes. HOME CARE INSTRUCTIONS   Ask when your test results will be ready. Make sure you get your test results.  Only take over-the-counter or prescription medicines for pain, discomfort, or fever as directed by your caregiver. If you were given medications for your condition, do not drive, operate machinery or power tools, or sign legal documents for 24 hours. Do not drink alcohol. Do not take sleeping pills or other medications that may interfere with treatment.  Continue all activities unless the activities cause more pain. When the pain lessens, slowly resume normal activities. Gradually increase the intensity and duration of the  activities or exercise.  During periods of severe pain, bed rest may be helpful. Lay or sit in any position that is comfortable.  Putting ice on the injured area.  Put ice in a bag.  Place a towel between your skin and the bag.  Leave the ice on for 15 to 20 minutes, 3 to 4 times a day.  Follow up with your caregiver for continued problems and no reason can be found for the pain. If the pain becomes worse or does not go away, it may be necessary to repeat tests or do additional testing. Your caregiver may need to look further for a possible cause. SEEK IMMEDIATE MEDICAL CARE IF:  You have pain that is getting worse and is not relieved by medications.  You develop chest pain that is associated with shortness or breath, sweating, feeling sick to your stomach (nauseous), or throw up (vomit).  Your pain becomes localized to the abdomen.  You develop any new symptoms that seem different or that concern you. MAKE SURE YOU:   Understand these instructions.  Will watch your condition.  Will get help right away if you are not doing well or get worse. Document Released: 10/03/2005 Document Revised: 12/26/2011 Document Reviewed: 06/07/2013 [facility] Patient Information 2015 [place], [place].  This information is not intended to replace advice given to you by your health care provider. Make sure you discuss any questions you have with your health care provider.

## 2015-07-10 NOTE — ED Provider Notes (Signed)
[facility] Emergency Department Provider Note  ____________________________________________  Time seen: 11:30 PM  I have reviewed the triage vital signs and the nursing notes.   HISTORY  Chief Complaint Leg Swelling and Shortness of Breath    HPI Theresa [name] is a 59 y.o. female who was recently hospitalized for a week for CHF and COPD exacerbation. He was discharged home about 6 days ago. She is been compliant with all her medications including oral Levaquin since then. Over the last 2 days, she's had worsening sensation of swelling and pain in the right leg. She also feels like her breathing is worse again with an exacerbation of chronic left posterior thorax pain and shortness of breath and dyspnea on exertion. She has some wheezing as well. No cough or fever or chills. No nausea vomiting diarrhea. No falls or trauma. No dizziness or syncope.     Past Medical History  Diagnosis Date  . COPD (chronic obstructive pulmonary disease)   . Hypertension   . Gastritis   . Lumbar radiculitis   . Muscle weakness   . DDD (degenerative disc disease), cervical   . Depression   . Chronic rhinitis   . Chronic generalized abdominal pain   . Anxiety   . Edema   . Carpal tunnel syndrome   . Low back pain   . Hematuria   . Malodorous urine   . Sensory urge incontinence   . Obesity   . Renal cyst   . Tobacco abuse   . CHF (congestive heart failure)      Patient Active Problem List   Diagnosis Date Noted  . Acute on chronic diastolic congestive heart failure 06/29/2015  . Respiratory insufficiency 06/29/2015  . Chronic respiratory failure with hypoxia 04/28/2015  . Tendinitis 04/21/2015  . BP (high blood pressure) 04/21/2015  . Blood clotting disorder 04/21/2015  . Chronic respiratory acidosis 04/18/2015  . Abdominal pain, periumbilical 54/62/7035  . Abdominal pain, generalized 07/17/2014  . Hypoventilation, idiopathic 07/16/2014  . Decreased motor  strength 07/16/2014  . Accumulation of fluid in tissues 07/16/2014  . Soft tissue lesion of shoulder region 07/16/2014  . Clinical depression 07/16/2014  . Chronic rhinitis 07/16/2014  . Carpal tunnel syndrome 07/16/2014  . Anxiety state 07/16/2014  . Neuritis or radiculitis due to rupture of lumbar intervertebral disc 07/02/2014  . DDD (degenerative disc disease), lumbar 07/02/2014  . Adiposity 02/27/2014  . Nicotine addiction 02/27/2014  . Abnormal result of cardiovascular function study 02/27/2014  . CAFL (chronic airflow limitation) 02/27/2014  . Herpes zona 07/16/2009  . Essential (primary) hypertension 03/27/2008     Past Surgical History  Procedure Laterality Date  . Tubal ligation    . Abdominal hysterectomy    . Tonsillectomy       Current Outpatient Rx  Name  Route  Sig  Dispense  Refill  . albuterol (PROVENTIL HFA;VENTOLIN HFA) 108 (90 BASE) MCG/ACT inhaler   Inhalation   Inhale 2 puffs into the lungs every 6 (six) hours as needed for wheezing.         Marland Kitchen ALPRAZolam (XANAX) 0.5 MG tablet   Oral   Take 1 tablet (0.5 mg total) by mouth at bedtime as needed for anxiety.   20 tablet   0   . Aspirin-Caffeine (BC FAST PAIN RELIEF ARTHRITIS) 1000-65 MG PACK   Oral   Take 1 packet by mouth every 6 (six) hours as needed (Pain).         . bisoprolol-hydrochlorothiazide [facility]) 10-6.25  MG per tablet      TAKE 1 TABLET BY MOUTH DAILY   90 tablet   1   . budesonide-formoterol (SYMBICORT) 160-4.5 MCG/ACT inhaler   Inhalation   Inhale 2 puffs into the lungs 2 (two) times daily.         Marland Kitchen estradiol (ESTRACE) 1 MG tablet   Oral   Take 1 mg by mouth daily.         . furosemide (LASIX) 20 MG tablet   Oral   Take 1 tablet (20 mg total) by mouth every other day.   30 tablet   0   . ipratropium-albuterol (DUONEB) 0.5-2.5 (3) MG/3ML SOLN   Nebulization   Take 3 mLs by nebulization every 6 (six) hours.   360 mL   0   . nicotine (NICODERM CQ - DOSED IN  MG/24 HOURS) 21 mg/24hr patch   Transdermal   Place 1 patch (21 mg total) onto the skin daily.   28 patch   0   . roflumilast (DALIRESP) 500 MCG TABS tablet   Oral   Take 500 mcg by mouth daily.         Marland Kitchen tiotropium (SPIRIVA) 18 MCG inhalation capsule   Inhalation   Place 1 capsule (18 mcg total) into inhaler and inhale daily.   30 capsule   12   . chlorpheniramine-HYDROcodone (TUSSIONEX PENNKINETIC ER) 10-8 MG/5ML SUER   Oral   Take 5 mLs by mouth every 12 (twelve) hours as needed for cough. Patient not taking: Reported on 05/05/2015   115 mL   0   . famciclovir (FAMVIR) 500 MG tablet   Oral   Take 1 tablet (500 mg total) by mouth 3 (three) times daily. Patient not taking: Reported on 06/29/2015   21 tablet   0   . fesoterodine (TOVIAZ) 8 MG TB24 tablet   Oral   Take 8 mg by mouth daily.         Marland Kitchen HYDROcodone-acetaminophen (NORCO/VICODIN) 5-325 MG per tablet   Oral   Take 1 tablet by mouth every 6 (six) hours as needed for moderate pain. Patient not taking: Reported on 06/29/2015   30 tablet   0   . levofloxacin (LEVAQUIN) 500 MG tablet   Oral   Take 1 tablet (500 mg total) by mouth daily at 3 pm.   5 tablet   0   . NON FORMULARY   Topical   Apply 1 application topically 4 (four) times daily as needed (joint/ muscle pain). Perform Cool & Soothing Pain Relieving Roll-On (Menthol 3.5% topical)         . oxyCODONE-acetaminophen (ROXICET) 5-325 MG per tablet   Oral   Take 1 tablet by mouth every 6 (six) hours as needed for severe pain.   4 tablet   0   . predniSONE (DELTASONE) 10 MG tablet      Take 50 mg daily taper by 10 mg daily then stop   15 tablet   0      Allergies Codeine   Family History  Problem Relation Age of Onset  . Kidney disease Mother   . Prostate cancer Neg Hx   . Kidney cancer Brother   . Lung cancer Sister   . Coronary artery disease Mother     Social History Social History  Substance Use Topics  . Smoking status:  Current Every Day Smoker -- 1.00 packs/day for 40 years    Types: Cigarettes  . Smokeless tobacco: Never Used  Comment: 30+ years, pt states she has cut back to 4-5 cigarettes a day  . Alcohol Use: No    Review of Systems  Constitutional:   No fever or chills. No weight changes Eyes:   No blurry vision or double vision.  ENT:   No sore throat. Cardiovascular:   No chest pain. Respiratory:   Shortness of breath worse with exertion. No chest pain. No cough.. Gastrointestinal:   Negative for abdominal pain, vomiting and diarrhea.  No BRBPR or melena. Genitourinary:   Negative for dysuria, urinary retention, bloody urine, or difficulty urinating. Musculoskeletal:   Right leg pain and swelling Skin:   Negative for rash. Neurological:   Negative for headaches, focal weakness or numbness. Psychiatric:  No anxiety or depression.   Endocrine:  No hot/cold intolerance, changes in energy, or sleep difficulty.  10-point ROS otherwise negative.  ____________________________________________   PHYSICAL EXAM:  VITAL SIGNS: ED Triage Vitals  Enc Vitals Group     BP 07/09/15 2255 116/61 mmHg     Pulse Rate 07/09/15 2255 93     Resp 07/09/15 2255 24     Temp 07/09/15 2255 98 F (36.7 C)     Temp Source 07/09/15 2255 Oral     SpO2 07/09/15 2255 94 %     Weight 07/09/15 2255 253 lb (114.76 kg)     Height 07/09/15 2255 _0  (1.6 m)     Head Cir --      Peak Flow --      Pain Score 07/09/15 2257 10     Pain Loc --      Pain Edu? --      Excl. in [place]? --      Constitutional:   Alert and oriented. Well appearing and in no distress. Eyes:   No scleral icterus. No conjunctival pallor. PERRL. EOMI ENT   Head:   Normocephalic and atraumatic.   Nose:   No congestion/rhinnorhea. No septal hematoma   Mouth/Throat:   MMM, no pharyngeal erythema. No peritonsillar mass. No uvula shift.   Neck:   No stridor. No SubQ emphysema. No  meningismus. Hematological/Lymphatic/Immunilogical:   No cervical lymphadenopathy. Cardiovascular:   RRR. Normal and symmetric distal pulses are present in all extremities. No murmurs, rubs, or gallops. Respiratory:   Mild tachypnea, mildly prolonged expiratory phase and mild wheezing diffusely. With forced expiration, there is accentuation of wheezing and becomes very pronounced in the expiratory phase becomes very prolonged Gastrointestinal:   Soft and nontender. No distention. There is no CVA tenderness.  No rebound, rigidity, or guarding. Genitourinary:   deferred Musculoskeletal:   Nontender with normal range of motion in all extremities. No joint effusions.  No lower extremity tenderness.  Symmetric trace edema bilateral lower extremities, nonpitting. There is tenderness in the right calf and right posterior thigh Neurologic:   Normal speech and language.  CN 2-10 normal. Motor grossly intact. No pronator drift.  Normal gait. No gross focal neurologic deficits are appreciated.  Skin:    Skin is warm, dry and intact. No rash noted.  No petechiae, purpura, or bullae. Psychiatric:   Mood and affect are normal. Speech and behavior are normal. Patient exhibits appropriate insight and judgment.  ____________________________________________    LABS (pertinent positives/negatives) (all labs ordered are listed, but only abnormal results are displayed) Labs Reviewed  BASIC METABOLIC PANEL - Abnormal; Notable for the following:    Glucose, Bld 116 (*)    BUN 23 (*)    All other components  within normal limits  CBC - Abnormal; Notable for the following:    WBC 15.8 (*)    All other components within normal limits  TROPONIN I   ____________________________________________   EKG  Interpreted by me Normal sinus rhythm rate of 78, normal axis intervals. Poor R progression in anterior precordial leads. Normal ST segments and T waves.  ____________________________________________     RADIOLOGY  Chest x-ray unremarkable  ____________________________________________   PROCEDURES   ____________________________________________   INITIAL IMPRESSION / ASSESSMENT AND PLAN / ED COURSE  Pertinent labs & imaging results that were available during my care of the patient were reviewed by me and considered in my medical decision making (see chart for details).  Patient presents with a mild COPD exacerbation. She's been taking Levaquin and I don't think that she is having a worsening of any underlying pneumonia. Does not look like she is having any CHF exacerbation based on lung exam, lack of JVD or hepatojugular reflex and unremarkable chest x-ray. She's been compliant with her Lasix therapy. Other labs are unremarkable. We'll follow-up ultrasound to ensure she doesn't have a DVT and plan for discharge home and outpatient follow-up.  ----------------------------------------- 1:56 AM on 07/10/2015 -----------------------------------------  Workup unremarkable. We will provide pain control. Patient has follow-up appointment with her primary care doctor tomorrow. No evidence of DVT or any other complication. Low suspicion for soft tissue infection. No evidence of CHF. We'll discharge her home good condition medically stable.   ____________________________________________   FINAL CLINICAL IMPRESSION(S) / ED DIAGNOSES  Final diagnoses:  Right leg pain  Chronic respiratory failure with hypoxia      Carrie Mew, MD 07/10/15 [phone]

## 2015-07-10 NOTE — Telephone Encounter (Signed)
Aldrin from [facility] called to follow up with previous message.  Please return his call.  Thanks Con Memos

## 2015-07-11 LAB — URINE CULTURE: Organism ID, Bacteria: NO GROWTH

## 2015-07-13 ENCOUNTER — Other Ambulatory Visit: Payer: Self-pay | Admitting: *Deleted

## 2015-07-13 NOTE — Patient Outreach (Signed)
Transition of care call (week 2): Spoke with pt, HIPPA verified.  RN CM discussed recent ER visit on 9/22 for swelling/pain in right leg, increased sob.  Pt states everything is the same.  Pt states f/u with Dr. Natale Milch 9/23, test was done, waiting for results, to call her back.    Pt states HH PT is coming, appetite good.  RN CM discussed f/u again next week as part of ongoing transition of care to which pt agreed.     Plan to f/u with pt again telephonically on 07/20/15 as part of ongoing transition of care.   [name].   [facility]  [phone]

## 2015-07-14 ENCOUNTER — Telehealth: Payer: Self-pay

## 2015-07-14 DIAGNOSIS — M25561 Pain in right knee: Secondary | ICD-10-CM

## 2015-07-14 NOTE — Telephone Encounter (Signed)
-----  Message from Margo Common, [place] sent at 07/10/2015  5:52 PM EDT ----- No sign of bony abnormality on x-ray of the knee. Probably will need referral to orthopedist if no better with rest, elevation, pain medication and daily Lasix for swelling. May apply moist heat or ice pack to her knee for additional pain relief.

## 2015-07-14 NOTE — Telephone Encounter (Signed)
Patient advised as directed below. Patient verbalized understanding. Patient states the knee pain is still unbearable after trying all recommendations. Patient requested to proceed with Ortho referral. Referral ordered.

## 2015-07-15 ENCOUNTER — Ambulatory Visit: Payer: Medicare PPO | Attending: Family | Admitting: Family

## 2015-07-15 ENCOUNTER — Encounter: Payer: Self-pay | Admitting: Family

## 2015-07-15 VITALS — BP 108/45 | HR 84 | Resp 20 | Ht 63.0 in | Wt 252.0 lb

## 2015-07-15 DIAGNOSIS — Z79899 Other long term (current) drug therapy: Secondary | ICD-10-CM | POA: Insufficient documentation

## 2015-07-15 DIAGNOSIS — F419 Anxiety disorder, unspecified: Secondary | ICD-10-CM | POA: Diagnosis not present

## 2015-07-15 DIAGNOSIS — J449 Chronic obstructive pulmonary disease, unspecified: Secondary | ICD-10-CM | POA: Insufficient documentation

## 2015-07-15 DIAGNOSIS — G5600 Carpal tunnel syndrome, unspecified upper limb: Secondary | ICD-10-CM | POA: Insufficient documentation

## 2015-07-15 DIAGNOSIS — F329 Major depressive disorder, single episode, unspecified: Secondary | ICD-10-CM | POA: Insufficient documentation

## 2015-07-15 DIAGNOSIS — F17210 Nicotine dependence, cigarettes, uncomplicated: Secondary | ICD-10-CM | POA: Diagnosis not present

## 2015-07-15 DIAGNOSIS — M79604 Pain in right leg: Secondary | ICD-10-CM | POA: Diagnosis not present

## 2015-07-15 DIAGNOSIS — I10 Essential (primary) hypertension: Secondary | ICD-10-CM | POA: Insufficient documentation

## 2015-07-15 DIAGNOSIS — R079 Chest pain, unspecified: Secondary | ICD-10-CM | POA: Diagnosis not present

## 2015-07-15 DIAGNOSIS — Z720 Tobacco use: Secondary | ICD-10-CM | POA: Insufficient documentation

## 2015-07-15 DIAGNOSIS — R0789 Other chest pain: Secondary | ICD-10-CM

## 2015-07-15 DIAGNOSIS — Z7982 Long term (current) use of aspirin: Secondary | ICD-10-CM | POA: Insufficient documentation

## 2015-07-15 DIAGNOSIS — F17200 Nicotine dependence, unspecified, uncomplicated: Secondary | ICD-10-CM | POA: Insufficient documentation

## 2015-07-15 DIAGNOSIS — I5032 Chronic diastolic (congestive) heart failure: Secondary | ICD-10-CM | POA: Diagnosis not present

## 2015-07-15 DIAGNOSIS — F17201 Nicotine dependence, unspecified, in remission: Secondary | ICD-10-CM | POA: Insufficient documentation

## 2015-07-15 DIAGNOSIS — J9611 Chronic respiratory failure with hypoxia: Secondary | ICD-10-CM

## 2015-07-15 MED ORDER — POTASSIUM CHLORIDE CRYS ER 20 MEQ PO TBCR: 20.0000 meq | EXTENDED_RELEASE_TABLET | Freq: Every day | ORAL | Status: DC

## 2015-07-15 MED ORDER — AZITHROMYCIN 250 MG PO TABS: ORAL_TABLET | ORAL | Status: DC

## 2015-07-15 MED ORDER — METOLAZONE 2.5 MG PO TABS: 2.5000 mg | ORAL_TABLET | Freq: Every day | ORAL | Status: DC

## 2015-07-15 NOTE — Progress Notes (Signed)
Subjective:    Patient ID: Theresa [name], female    DOB: [birth year], 59 y.o.   MRN: [id number]  Congestive Heart Failure Presents for initial visit. The disease course has been stable. Associated symptoms include chest pain, edema, fatigue, orthopnea (sleeping in recliner) and shortness of breath. Pertinent negatives include no abdominal pain or palpitations. The symptoms have been stable. Past treatments include beta blockers, salt and fluid restriction and oxygen. The treatment provided mild relief. Compliance with prior treatments has been good. Her past medical history is significant for chronic lung disease and HTN. There is no history of CVA or DVT. She has one 1st degree relative with heart disease. Compliance with total regimen is 76-100%.  Chest Pain  This is a new problem. The current episode started in the past 7 days. The onset quality is gradual. The problem occurs daily. The problem has been unchanged. The pain is present in the substernal region. Pain scale: varies from "mild" to "intense" The pain is moderate. The quality of the pain is described as squeezing. The pain radiates to the left neck. Associated symptoms include back pain, a cough (intermittent), dizziness (sometimes), headaches, leg pain, lower extremity edema, malaise/fatigue, shortness of breath and sputum production. Pertinent negatives include no abdominal pain, fever, irregular heartbeat, numbness, palpitations, vomiting or weakness. The cough has no precipitants. The cough is productive. There is no color change associated with the cough. Nothing relieves the cough. The cough is worsened by activity, smoke exposure and a supine position. The pain is aggravated by nothing. She has tried analgesics for the symptoms. The treatment provided mild relief. Risk factors include hormone replacement therapy, lack of exercise, obesity, sedentary lifestyle, smoking/tobacco exposure and stress.  Her past medical history is significant  for anxiety/panic attacks, COPD, CHF, HTN and hypertension.  Pertinent negatives for past medical history include no DVT and no strokes.  Her family medical history is significant for CAD. Prior diagnostic workup includes echocardiogram and chest x-ray.  Other This is a new (edema) problem. The current episode started in the past 7 days. The problem occurs constantly. The problem has been gradually worsening. Associated symptoms include arthralgias (right leg), chest pain, coughing (intermittent), fatigue, headaches and neck pain. Pertinent negatives include no abdominal pain, congestion, fever, numbness, sore throat, vomiting or weakness. The symptoms are aggravated by walking and standing. She has tried position changes, heat, ice, oral narcotics, NSAIDs and lying down for the symptoms. The treatment provided mild relief.    Past Medical History  Diagnosis Date  . COPD (chronic obstructive pulmonary disease)   . Hypertension   . Gastritis   . Lumbar radiculitis   . Muscle weakness   . DDD (degenerative disc disease), cervical   . Depression   . Chronic rhinitis   . Chronic generalized abdominal pain   . Anxiety   . Edema   . Carpal tunnel syndrome   . Low back pain   . Hematuria   . Malodorous urine   . Sensory urge incontinence   . Obesity   . Renal cyst   . Tobacco abuse   . CHF (congestive heart failure)    Past Surgical History  Procedure Laterality Date  . Tubal ligation    . Abdominal hysterectomy    . Tonsillectomy    . Carpal tunnel release Left     Family History  Problem Relation Age of Onset  . Kidney disease Mother   . Prostate cancer Neg Hx   . Kidney cancer  Brother   . Lung cancer Sister   . Coronary artery disease Mother     Social History  Substance Use Topics  . Smoking status: Current Every Day Smoker -- 1.00 packs/day for 40 years    Types: Cigarettes  . Smokeless tobacco: Never Used     Comment: 30+ years, pt states she has cut back to 4-5  cigarettes a day  . Alcohol Use: No    Allergies  Allergen Reactions  . Codeine Nausea And Vomiting    Prior to Admission medications   Medication Sig Start Date End Date Taking? Authorizing Provider  albuterol (PROVENTIL HFA;VENTOLIN HFA) 108 (90 BASE) MCG/ACT inhaler Inhale 2 puffs into the lungs every 6 (six) hours as needed for wheezing.   Yes Historical Provider, MD  ALPRAZolam Duanne Moron) 0.5 MG tablet Take 1 tablet (0.5 mg total) by mouth at bedtime as needed for anxiety. 07/04/15  Yes Fritzi Mandes, MD  Aspirin-Caffeine (BC FAST PAIN RELIEF ARTHRITIS) 1000-65 MG PACK Take 1 packet by mouth every 6 (six) hours as needed (Pain).   Yes Historical Provider, MD  bisoprolol-hydrochlorothiazide [facility]) 10-6.25 MG per tablet TAKE 1 TABLET BY MOUTH DAILY 05/31/15  Yes Vickki Muff Chrismon, PA  budesonide-formoterol (SYMBICORT) 160-4.5 MCG/ACT inhaler Inhale 2 puffs into the lungs 2 (two) times daily.   Yes Historical Provider, MD  chlorpheniramine-HYDROcodone (TUSSIONEX PENNKINETIC ER) 10-8 MG/5ML SUER Take 5 mLs by mouth every 12 (twelve) hours as needed for cough. 03/01/15  Yes Frederich Cha, MD  estradiol (ESTRACE) 1 MG tablet Take 1 mg by mouth daily.   Yes Historical Provider, MD  furosemide (LASIX) 20 MG tablet Take 1 tablet (20 mg total) by mouth every other day. Patient taking differently: Take 20 mg by mouth daily.  07/04/15  Yes Fritzi Mandes, MD  ipratropium-albuterol (DUONEB) 0.5-2.5 (3) MG/3ML SOLN Take 3 mLs by nebulization every 6 (six) hours. 04/21/15  Yes Srikar Sudini, MD  nicotine (NICODERM CQ - DOSED IN MG/24 HOURS) 21 mg/24hr patch Place 1 patch (21 mg total) onto the skin daily. 07/04/15  Yes Fritzi Mandes, MD  nitrofurantoin, macrocrystal-monohydrate, (MACROBID) 100 MG capsule Take 1 capsule (100 mg total) by mouth 2 (two) times daily. 07/10/15  Yes Dennis E Chrismon, PA  NON FORMULARY Apply 1 application topically 4 (four) times daily as needed (joint/ muscle pain). Perform Cool & Soothing Pain  Relieving Roll-On (Menthol 3.5% topical)   Yes Historical Provider, MD  oxyCODONE-acetaminophen (ROXICET) 5-325 MG per tablet Take 1 tablet by mouth every 6 (six) hours as needed for severe pain. 07/10/15  Yes Dennis E Chrismon, PA  roflumilast (DALIRESP) 500 MCG TABS tablet Take 500 mcg by mouth daily.   Yes Historical Provider, MD  sertraline (ZOLOFT) 50 MG tablet Take 1 tablet (50 mg total) by mouth daily. 07/10/15  Yes Dennis E Chrismon, PA  tiotropium (SPIRIVA) 18 MCG inhalation capsule Place 1 capsule (18 mcg total) into inhaler and inhale daily. 07/04/15  Yes Fritzi Mandes, MD     Review of Systems  Constitutional: Positive for malaise/fatigue and fatigue. Negative for fever and appetite change.  HENT: Negative for congestion, postnasal drip and sore throat.   Eyes: Negative.   Respiratory: Positive for cough (intermittent), sputum production, shortness of breath and wheezing. Negative for chest tightness.   Cardiovascular: Positive for chest pain and leg swelling (right leg). Negative for palpitations.  Gastrointestinal: Positive for abdominal distention. Negative for vomiting and abdominal pain.  Endocrine: Negative.   Genitourinary: Negative.   Musculoskeletal: Positive  for back pain, arthralgias (right leg) and neck pain.  Skin: Negative.   Allergic/Immunologic: Negative.   Neurological: Positive for dizziness (sometimes) and headaches. Negative for weakness, light-headedness and numbness.  Hematological: Negative for adenopathy. Bruises/bleeds easily.  Psychiatric/Behavioral: Positive for sleep disturbance (sleep in recliner) and dysphoric mood. The patient is nervous/anxious.        Objective:   Physical Exam  Constitutional: She is oriented to person, place, and time. She appears well-developed and well-nourished.  HENT:  Head: Normocephalic and atraumatic.  Eyes: Conjunctivae are normal. Pupils are equal, round, and reactive to light.  Neck: Normal range of motion. Neck  supple.  Cardiovascular: Normal rate and regular rhythm.   Pulmonary/Chest: She has rhonchi in the right upper field, the right lower field, the left upper field and the left lower field. She has rales in the right lower field and the left lower field.  Abdominal: Soft. She exhibits distension. There is no tenderness.  Musculoskeletal: She exhibits edema (2+ pitting edema in right lower leg and 1+ in left lower leg) and tenderness (right lower leg).  Neurological: She is alert and oriented to person, place, and time.  Skin: Skin is warm and dry.  Psychiatric: Her behavior is normal. Thought content normal. Her mood appears anxious.  Nursing note and vitals reviewed.  BP 108/45 mmHg  Pulse 84  Resp 20  Ht _0  (1.6 m)  Wt 252 lb (114.306 kg)  BMI 44.65 kg/m2  SpO2 92%        Assessment & Plan:  1: Chronic heart failure with preserved ejection fraction- Patient presents with fatigue and shortness of breath with little exertion. She says that just getting dressed makes her short of breath. She has to sit on the side of the tub to bathe and would like a shower chair. Prescription was provided for her to get a shower chair so that she can safely bathe. She does wear oxygen at 2.5L around the clock. She has increasing edema in the right leg that began suddenly a little over a week ago. DVT was ruled out. She does elevate her legs when at home. Sleeps in a recliner because she can't lay flat. She does weigh daily and says that her weight has been stable. Advised to call for an overnight weight gain of >2 pounds or a weekly weight gain of >5 pounds. She does not add salt to her food and uses Mrs. Dash for seasoning. Reviewed how to read food labels and the importance of following a 2016m sodium diet. She does admit to drinking a lot of fluid during the day. She says that she drinks one 12 oz mountain dew and at least 80 ounces of water. Discussed the importance of limiting her fluid intake to 2L  daily. Due to edema and rales in her lungs will add metolazone 2.552mdaily for the next 2 days. Discussed taking it 30 minutes prior to taking her lasix if possible. Will also give her potassium to take with it. Will plan on checking a chemistry panel at her next visit.  2: HTN- Blood pressure looks good today. 3: COPD- Patient wears oxygen chronically around the clock at 2.5L along with bipap at night. She sees Dr. FlRaul Deln regards to this and will be seeing him sometime around January 2017. 4: Right leg pain- This occurred suddenly a little over a week ago. She says that she went to bed and the leg was fine and the pain woke her up the  next morning and it was also swollen. Has had an ultrasound which ruled out DVT as well as xray by her PCP. She is currently scheduled to see orthopedics on 07/22/15. Could also consider vascular referral if needed. Currently taking oxycodone for pain which provides minimal relief. Neither heat nor ice help as well. 5: Chest pain- Patient says that she really thinks the chest pain is due to anxiety that she is having. Her husband is a truck driver so she's home alone often which makes her anxious and now with all these heath issues worsening, she finds that her anxiety is worse. She describes the pain as squeezing in nature and it resolves on its own. She saw her cardiologist the day before. Encouraged her to follow up with her cardiologist if symptom worsens/changes as well as with her PCP regarding her anxiety/depression. EKG done and it appears to be unchanged from previous one.  6: Tobacco use- Patient says that she sometimes wears the nicotine patch but then sometimes she doesn't and then will smoke anywhere from 5-10 cigarettes. She does say that she removes her oxygen and doesn't smoke anywhere near it. Complete cessation discussed for 5 minutes with her.  Return in 1 week or sooner for any questions/problems before then.

## 2015-07-15 NOTE — Patient Instructions (Addendum)
Continue weighing daily and call for an overnight weight gain of > 2 pounds or a weekly weight gain of >5 pounds.  Decrease fluid intake to 2 liters daily.   Take extra fluid pill daily for next 2 days ideally 30 minutes prior to taking lasix.   Take all antibiotic tablets (2 today and then 1 daily for next four days)   Low-Sodium Eating Plan Sodium raises blood pressure and causes water to be held in the body. Getting less sodium from food will help lower your blood pressure, reduce any swelling, and protect your heart, liver, and kidneys. We get sodium by adding salt (sodium chloride) to food. Most of our sodium comes from canned, boxed, and frozen foods. Restaurant foods, fast foods, and pizza are also very high in sodium. Even if you take medicine to lower your blood pressure or to reduce fluid in your body, getting less sodium from your food is important. WHAT IS MY PLAN? Most people should limit their sodium intake to 2,300 mg a day. Your health care provider recommends that you limit your sodium intake to 2085m_ a day.  WHAT DO I NEED TO KNOW ABOUT THIS EATING PLAN? For the low-sodium eating plan, you will follow these general guidelines:  Choose foods with a % Daily Value for sodium of less than 5% (as listed on the food label).   Use salt-free seasonings or herbs instead of table salt or sea salt.   Check with your health care provider or pharmacist before using salt substitutes.   Eat fresh foods.  Eat more vegetables and fruits.  Limit canned vegetables. If you do use them, rinse them well to decrease the sodium.   Limit cheese to 1 oz (28 g) per day.   Eat lower-sodium products, often labeled as "lower sodium" or "no salt added."  Avoid foods that contain monosodium glutamate (MSG). MSG is sometimes added to [place] and some canned foods.  Check food labels (Nutrition Facts labels) on foods to learn how much sodium is in one serving.  Eat more home-cooked  food and less restaurant, buffet, and fast food.  When eating at a restaurant, ask that your food be prepared with less salt or none, if possible.  HOW DO I READ FOOD LABELS FOR SODIUM INFORMATION? The Nutrition Facts label lists the amount of sodium in one serving of the food. If you eat more than one serving, you must multiply the listed amount of sodium by the number of servings. Food labels may also identify foods as:  Sodium free--Less than 5 mg in a serving.  Very low sodium--35 mg or less in a serving.  Low sodium--140 mg or less in a serving.  Light in sodium--50% less sodium in a serving. For example, if a food that usually has 300 mg of sodium is changed to become light in sodium, it will have 150 mg of sodium.  Reduced sodium--25% less sodium in a serving. For example, if a food that usually has 400 mg of sodium is changed to reduced sodium, it will have 300 mg of sodium. WHAT FOODS CAN I EAT? Grains Low-sodium cereals, including oats, puffed wheat and rice, and shredded wheat cereals. Low-sodium crackers. Unsalted rice and pasta. Lower-sodium bread.  Vegetables Frozen or fresh vegetables. Low-sodium or reduced-sodium canned vegetables. Low-sodium or reduced-sodium tomato sauce and paste. Low-sodium or reduced-sodium tomato and vegetable juices.  Fruits Fresh, frozen, and canned fruit. Fruit juice.  Meat and Other Protein Products Low-sodium canned tuna and  salmon. Fresh or frozen meat, poultry, seafood, and fish. Lamb. Unsalted nuts. Dried beans, peas, and lentils without added salt. Unsalted canned beans. Homemade soups without salt. Eggs.  Dairy Milk. Soy milk. Ricotta cheese. Low-sodium or reduced-sodium cheeses. Yogurt.  Condiments Fresh and dried herbs and spices. Salt-free seasonings. Onion and garlic powders. Low-sodium varieties of mustard and ketchup. Lemon juice.  Fats and Oils Reduced-sodium salad dressings. Unsalted butter.  Other Unsalted popcorn  and pretzels.  The items listed above may not be a complete list of recommended foods or beverages. Contact your dietitian for more options. WHAT FOODS ARE NOT RECOMMENDED? Grains Instant hot cereals. Bread stuffing, pancake, and biscuit mixes. Croutons. Seasoned rice or pasta mixes. Noodle soup cups. Boxed or frozen macaroni and cheese. Self-rising flour. Regular salted crackers. Vegetables Regular canned vegetables. Regular canned tomato sauce and paste. Regular tomato and vegetable juices. Frozen vegetables in sauces. Salted french fries. Olives. Angie Fava. Relishes. Sauerkraut. Salsa. Meat and Other Protein Products Salted, canned, smoked, spiced, or pickled meats, seafood, or fish. Bacon, ham, sausage, hot dogs, corned beef, chipped beef, and packaged luncheon meats. Salt pork. Jerky. Pickled herring. Anchovies, regular canned tuna, and sardines. Salted nuts. Dairy Processed cheese and cheese spreads. Cheese curds. Blue cheese and cottage cheese. Buttermilk.  Condiments Onion and garlic salt, seasoned salt, table salt, and sea salt. Canned and packaged gravies. Worcestershire sauce. Tartar sauce. Barbecue sauce. Teriyaki sauce. Soy sauce, including reduced sodium. Steak sauce. Fish sauce. Oyster sauce. Cocktail sauce. Horseradish. Regular ketchup and mustard. Meat flavorings and tenderizers. Bouillon cubes. Hot sauce. Tabasco sauce. Marinades. Taco seasonings. Relishes. Fats and Oils Regular salad dressings. Salted butter. Margarine. Ghee. Bacon fat.  Other Potato and tortilla chips. Corn chips and puffs. Salted popcorn and pretzels. Canned or dried soups. Pizza. Frozen entrees and pot pies.  The items listed above may not be a complete list of foods and beverages to avoid. Contact your dietitian for more information. Document Released: 03/25/2002 Document Revised: 10/08/2013 Document Reviewed: 08/07/2013 [facility] Patient Information 2015 [place], [place]. This information is not  intended to replace advice given to you by your health care provider. Make sure you discuss any questions you have with your health care provider.

## 2015-07-17 ENCOUNTER — Telehealth: Payer: Self-pay

## 2015-07-17 NOTE — Telephone Encounter (Signed)
-----  Message from Margo Common, [place] sent at 07/17/2015  2:40 AM EDT ----- No bacterial growth on urine culture. May discontinue Macrobid (nitrofurantoin). Recheck CBC in a month for resolution of elevation noted on last count.

## 2015-07-17 NOTE — Telephone Encounter (Signed)
Patient advised as directed below. Patient verbalized understanding and agrees with treatment plan.

## 2015-07-20 ENCOUNTER — Other Ambulatory Visit: Payer: Self-pay | Admitting: *Deleted

## 2015-07-20 NOTE — Patient Outreach (Signed)
Transition of care call #3 done for Theresa [name]. Pt is doing fair. She reports she is getting home health and PT from [place]. Her MD gave her a RX for a shower chair. I have advised her to present this to her home health nurse so that she may acquire this item for her. She reports she is weighing every day and she has lost 13# since she went into the [name]. She has all her meds and is taking them as ordered. She is being referred to an orthopedist for her leg pain which was not a DVT. I have advised pt I am available to her if she has any questions or problems. I will call her again next week on Rose's behalf.  [name] [facility] [phone]

## 2015-07-24 ENCOUNTER — Ambulatory Visit: Payer: Medicare PPO | Attending: Family | Admitting: Family

## 2015-07-24 ENCOUNTER — Encounter: Payer: Self-pay | Admitting: Family

## 2015-07-24 VITALS — BP 101/53 | HR 78 | Resp 20 | Ht 63.0 in | Wt 250.0 lb

## 2015-07-24 DIAGNOSIS — F329 Major depressive disorder, single episode, unspecified: Secondary | ICD-10-CM | POA: Insufficient documentation

## 2015-07-24 DIAGNOSIS — I5032 Chronic diastolic (congestive) heart failure: Secondary | ICD-10-CM

## 2015-07-24 DIAGNOSIS — E669 Obesity, unspecified: Secondary | ICD-10-CM | POA: Insufficient documentation

## 2015-07-24 DIAGNOSIS — F17200 Nicotine dependence, unspecified, uncomplicated: Secondary | ICD-10-CM | POA: Insufficient documentation

## 2015-07-24 DIAGNOSIS — M5136 Other intervertebral disc degeneration, lumbar region: Secondary | ICD-10-CM

## 2015-07-24 DIAGNOSIS — F32A Depression, unspecified: Secondary | ICD-10-CM

## 2015-07-24 DIAGNOSIS — G5600 Carpal tunnel syndrome, unspecified upper limb: Secondary | ICD-10-CM | POA: Insufficient documentation

## 2015-07-24 DIAGNOSIS — Z6841 Body Mass Index (BMI) 40.0 and over, adult: Secondary | ICD-10-CM | POA: Insufficient documentation

## 2015-07-24 DIAGNOSIS — Z7982 Long term (current) use of aspirin: Secondary | ICD-10-CM | POA: Diagnosis not present

## 2015-07-24 DIAGNOSIS — G8929 Other chronic pain: Secondary | ICD-10-CM | POA: Insufficient documentation

## 2015-07-24 DIAGNOSIS — Z79899 Other long term (current) drug therapy: Secondary | ICD-10-CM | POA: Diagnosis not present

## 2015-07-24 DIAGNOSIS — J310 Chronic rhinitis: Secondary | ICD-10-CM | POA: Insufficient documentation

## 2015-07-24 DIAGNOSIS — M545 Low back pain: Secondary | ICD-10-CM | POA: Diagnosis not present

## 2015-07-24 DIAGNOSIS — I10 Essential (primary) hypertension: Secondary | ICD-10-CM

## 2015-07-24 DIAGNOSIS — Z720 Tobacco use: Secondary | ICD-10-CM

## 2015-07-24 DIAGNOSIS — J449 Chronic obstructive pulmonary disease, unspecified: Secondary | ICD-10-CM | POA: Insufficient documentation

## 2015-07-24 LAB — BASIC METABOLIC PANEL
Anion gap: 9 (ref 5–15)
BUN: 22 mg/dL — ABNORMAL HIGH (ref 6–20)
CO2: 30 mmol/L (ref 22–32)
Calcium: 9.1 mg/dL (ref 8.9–10.3)
Chloride: 102 mmol/L (ref 101–111)
Creatinine, Ser: 0.66 mg/dL (ref 0.44–1.00)
GFR calc Af Amer: >60 mL/min (ref >60)
GFR calc non Af Amer: >60 mL/min (ref >60)
Glucose, Bld: 99 mg/dL (ref 65–99)
Potassium: 3.6 mmol/L (ref 3.5–5.1)
Sodium: 141 mmol/L (ref 135–145)

## 2015-07-24 MED ORDER — FUROSEMIDE 20 MG PO TABS: 20.0000 mg | ORAL_TABLET | Freq: Every day | ORAL | Status: DC

## 2015-07-24 NOTE — Progress Notes (Signed)
Subjective:    Patient ID: Theresa [name], female    DOB: November 04, 1955, 59 y.o.   MRN: [id number]  Congestive Heart Failure Presents for follow-up visit. The disease course has been improving. Associated symptoms include edema (much better), fatigue, orthopnea (sleeps in the recliner), palpitations and shortness of breath (with exertion). Pertinent negatives include no abdominal pain or chest pain. The symptoms have been improving. Past treatments include beta blockers, salt and fluid restriction and oxygen. The treatment provided moderate relief. Compliance with prior treatments has been good. Her past medical history is significant for chronic lung disease and HTN. She has one 1st degree relative with heart disease. Compliance with total regimen is 76-100%.  Back Pain This is a chronic problem. The current episode started more than 1 year ago. The problem occurs constantly. The problem has been gradually worsening since onset. The pain is present in the lumbar spine. The quality of the pain is described as stabbing and shooting. The pain radiates to the left thigh, right thigh, right knee and left knee. The pain is at a severity of 10/10. The pain is moderate. The pain is the same all the time. The symptoms are aggravated by standing. Stiffness is present all day. Associated symptoms include headaches, paresthesias (sometimes) and tingling (sometimes). Pertinent negatives include no abdominal pain, chest pain or dysuria. Risk factors include lack of exercise, obesity and sedentary lifestyle. She has tried analgesics for the symptoms. The treatment provided mild relief.    Past Medical History  Diagnosis Date  . COPD (chronic obstructive pulmonary disease) ([facility])   . Hypertension   . Gastritis   . Lumbar radiculitis   . Muscle weakness   . DDD (degenerative disc disease), cervical   . Depression   . Chronic rhinitis   . Chronic generalized abdominal pain   . Anxiety   . Edema   . Carpal tunnel  syndrome   . Low back pain   . Hematuria   . Malodorous urine   . Sensory urge incontinence   . Obesity   . Renal cyst   . Tobacco abuse   . CHF (congestive heart failure) [facility])     Past Surgical History  Procedure Laterality Date  . Tubal ligation    . Abdominal hysterectomy    . Tonsillectomy    . Carpal tunnel release Left     Family History  Problem Relation Age of Onset  . Kidney disease Mother   . Prostate cancer Neg Hx   . Kidney cancer Brother   . Lung cancer Sister   . Coronary artery disease Mother     Social History  Substance Use Topics  . Smoking status: Current Every Day Smoker -- 1.00 packs/day for 40 years    Types: Cigarettes  . Smokeless tobacco: Never Used     Comment: 30+ years, pt states she has cut back to 4-5 cigarettes a day  . Alcohol Use: No    Allergies  Allergen Reactions  . Codeine Nausea And Vomiting    Prior to Admission medications   Medication Sig Start Date End Date Taking? Authorizing Provider  albuterol (PROVENTIL HFA;VENTOLIN HFA) 108 (90 BASE) MCG/ACT inhaler Inhale 2 puffs into the lungs every 6 (six) hours as needed for wheezing.   Yes Historical Provider, MD  ALPRAZolam Duanne Moron) 0.5 MG tablet Take 1 tablet (0.5 mg total) by mouth at bedtime as needed for anxiety. 07/04/15  Yes Fritzi Mandes, MD  bisoprolol-hydrochlorothiazide (ZIAC) 10-6.25 MG per tablet TAKE 1  TABLET BY MOUTH DAILY 05/31/15  Yes Vickki Muff Chrismon, PA  budesonide-formoterol (SYMBICORT) 160-4.5 MCG/ACT inhaler Inhale 2 puffs into the lungs 2 (two) times daily.   Yes Historical Provider, MD  chlorpheniramine-HYDROcodone (TUSSIONEX PENNKINETIC ER) 10-8 MG/5ML SUER Take 5 mLs by mouth every 12 (twelve) hours as needed for cough. 03/01/15  Yes Frederich Cha, MD  estradiol (ESTRACE) 1 MG tablet Take 1 mg by mouth daily.   Yes Historical Provider, MD  furosemide (LASIX) 20 MG tablet Take 1 tablet (20 mg total) by mouth daily. 07/24/15  Yes [name], FNP   ipratropium-albuterol (DUONEB) 0.5-2.5 (3) MG/3ML SOLN Take 3 mLs by nebulization every 6 (six) hours. 04/21/15  Yes Srikar Sudini, MD  nicotine (NICODERM CQ - DOSED IN MG/24 HOURS) 21 mg/24hr patch Place 1 patch (21 mg total) onto the skin daily. 07/04/15  Yes Fritzi Mandes, MD  NON FORMULARY Apply 1 application topically 4 (four) times daily as needed (joint/ muscle pain). Perform Cool & Soothing Pain Relieving Roll-On (Menthol 3.5% topical)   Yes Historical Provider, MD  roflumilast (DALIRESP) 500 MCG TABS tablet Take 500 mcg by mouth daily.   Yes Historical Provider, MD  sertraline (ZOLOFT) 50 MG tablet Take 1 tablet (50 mg total) by mouth daily. 07/10/15  Yes Dennis E Chrismon, PA  tiotropium (SPIRIVA) 18 MCG inhalation capsule Place 1 capsule (18 mcg total) into inhaler and inhale daily. 07/04/15  Yes Fritzi Mandes, MD  Aspirin-Caffeine (BC FAST PAIN RELIEF ARTHRITIS) 1000-65 MG PACK Take 1 packet by mouth every 6 (six) hours as needed (Pain).    Historical Provider, MD     Review of Systems  Constitutional: Positive for fatigue. Negative for appetite change.  HENT: Negative for congestion, postnasal drip and sore throat.   Eyes: Negative.   Respiratory: Positive for shortness of breath (with exertion). Negative for cough, chest tightness and wheezing.   Cardiovascular: Positive for palpitations and leg swelling. Negative for chest pain.  Gastrointestinal: Negative for abdominal pain and abdominal distention.  Endocrine: Negative.   Genitourinary: Negative.  Negative for dysuria.  Musculoskeletal: Positive for back pain (chronic sharp). Negative for neck pain.  Skin: Negative.   Allergic/Immunologic: Negative.   Neurological: Positive for tingling (sometimes), light-headedness (with quick position changes), headaches and paresthesias (sometimes). Negative for dizziness.  Hematological: Negative for adenopathy. Bruises/bleeds easily.  Psychiatric/Behavioral: Positive for sleep disturbance (in  recliner) and dysphoric mood. The patient is not nervous/anxious.        Objective:   Physical Exam  Constitutional: She is oriented to person, place, and time. She appears well-developed and well-nourished.  HENT:  Head: Normocephalic and atraumatic.  Eyes: Conjunctivae are normal. Pupils are equal, round, and reactive to light.  Neck: Normal range of motion. Neck supple.  Cardiovascular: Normal rate and regular rhythm.   Pulmonary/Chest: Effort normal. She has no wheezes. She has no rales.  Abdominal: Soft. She exhibits no distension. There is tenderness (slightly tender over umbilicus).  Musculoskeletal: She exhibits edema (trace amount bilaterally). She exhibits no tenderness.  Neurological: She is alert and oriented to person, place, and time.  Skin: Skin is warm and dry.  Psychiatric: She has a normal mood and affect. Her behavior is normal. Thought content normal.  Nursing note and vitals reviewed.   BP 101/53 mmHg  Pulse 78  Resp 20  Ht _0  (1.6 m)  Wt 250 lb (113.399 kg)  BMI 44.30 kg/m2  SpO2 95%       Assessment & Plan:  1: Chronic heart failure with preserved ejection fraction- Patient presents with fatigue and shortness of breath with very little exertion. She came into the office today via wheelchair but says that it's mostly because of the pain in her back. She says that at rest, she is symptom free. She has been weighing herself daily but says that her scale is an analog scale and she admits that it's difficult for her to see the needle gauge. A set of scales was given to her today so that she can get an accurate weight. Reminded to call for an overnight weight gain of >2 pounds or a weekly weight gain of >5 pounds. She is not adding any salt to her foods and has been diligently reading food labels so that she can follow a 2079m sodium diet. She says that she's drinking more water and less soda as well. Does have home health continuing to come to the home. Will  check a basic metabolic panel today since she took the metolazone last week. New prescription for her furosemide sent to the pharmacy as well.  2: HTN- Blood pressure on the low side but similar to previous visit. Gets lightheaded only when she changes positions quickly. Encouraged her to change positions slowly. 3: Degenerative disc disease- Patient says that she has chronic pain in her lower back which is sharp in nature and will occasionally bring tears to her eyes because of the pain. She says that it will also radiate down both of her legs at times. She has had injections in the back but would really like another pain management clinic to take a look at her to see what can be done about the pain. She really doesn't want to take oral narcotics on a long-term basis. Encouraged her to call her PCP to see if they will go ahead and make a referral to the pain clinic for evaluation. 4: Depression- Patient does endorse feeling depressed and isn't sure if her medication is working as well as it was. Denies suicidal thoughts and she feels like if she wasn't in so much pain, she would feel better. Again, encouraged her to discuss this with her PCP as she may need a dose adjustment or possibly a different treatment. 5: Tobacco use- Patient continues to smoke at times but does remove her oxygen and doesn't smoke near it.   Return in 1 month or sooner for any questions/problems before then.

## 2015-07-24 NOTE — Patient Instructions (Signed)
Continue weighing daily and call for an overnight weight gain of > 2 pounds or a weekly weight gain of >5 pounds.   Low-Sodium Eating Plan Sodium raises blood pressure and causes water to be held in the body. Getting less sodium from food will help lower your blood pressure, reduce any swelling, and protect your heart, liver, and kidneys. We get sodium by adding salt (sodium chloride) to food. Most of our sodium comes from canned, boxed, and frozen foods. Restaurant foods, fast foods, and pizza are also very high in sodium. Even if you take medicine to lower your blood pressure or to reduce fluid in your body, getting less sodium from your food is important. WHAT IS MY PLAN? Most people should limit their sodium intake to 2,300 mg a day. Your health care provider recommends that you limit your sodium intake to 2089m_ a day.  WHAT DO I NEED TO KNOW ABOUT THIS EATING PLAN? For the low-sodium eating plan, you will follow these general guidelines:  Choose foods with a % Daily Value for sodium of less than 5% (as listed on the food label).   Use salt-free seasonings or herbs instead of table salt or sea salt.   Check with your health care provider or pharmacist before using salt substitutes.   Eat fresh foods.  Eat more vegetables and fruits.  Limit canned vegetables. If you do use them, rinse them well to decrease the sodium.   Limit cheese to 1 oz (28 g) per day.   Eat lower-sodium products, often labeled as "lower sodium" or "no salt added."  Avoid foods that contain monosodium glutamate (MSG). MSG is sometimes added to [place] and some canned foods.  Check food labels (Nutrition Facts labels) on foods to learn how much sodium is in one serving.  Eat more home-cooked food and less restaurant, buffet, and fast food.  When eating at a restaurant, ask that your food be prepared with less salt, or no salt if possible.  HOW DO I READ FOOD LABELS FOR SODIUM INFORMATION? The  Nutrition Facts label lists the amount of sodium in one serving of the food. If you eat more than one serving, you must multiply the listed amount of sodium by the number of servings. Food labels may also identify foods as:  Sodium free--Less than 5 mg in a serving.  Very low sodium--35 mg or less in a serving.  Low sodium--140 mg or less in a serving.  Light in sodium--50% less sodium in a serving. For example, if a food that usually has 300 mg of sodium is changed to become light in sodium, it will have 150 mg of sodium.  Reduced sodium--25% less sodium in a serving. For example, if a food that usually has 400 mg of sodium is changed to reduced sodium, it will have 300 mg of sodium. WHAT FOODS CAN I EAT? Grains Low-sodium cereals, including oats, puffed wheat and rice, and shredded wheat cereals. Low-sodium crackers. Unsalted rice and pasta. Lower-sodium bread.  Vegetables Frozen or fresh vegetables. Low-sodium or reduced-sodium canned vegetables. Low-sodium or reduced-sodium tomato sauce and paste. Low-sodium or reduced-sodium tomato and vegetable juices.  Fruits Fresh, frozen, and canned fruit. Fruit juice.  Meat and Other Protein Products Low-sodium canned tuna and salmon. Fresh or frozen meat, poultry, seafood, and fish. Lamb. Unsalted nuts. Dried beans, peas, and lentils without added salt. Unsalted canned beans. Homemade soups without salt. Eggs.  Dairy Milk. Soy milk. Ricotta cheese. Low-sodium or reduced-sodium cheeses. Yogurt.  Condiments Fresh and dried herbs and spices. Salt-free seasonings. Onion and garlic powders. Low-sodium varieties of mustard and ketchup. Fresh or refrigerated horseradish. Lemon juice.  Fats and Oils Reduced-sodium salad dressings. Unsalted butter.  Other Unsalted popcorn and pretzels.  The items listed above may not be a complete list of recommended foods or beverages. Contact your dietitian for more options. WHAT FOODS ARE NOT  RECOMMENDED? Grains Instant hot cereals. Bread stuffing, pancake, and biscuit mixes. Croutons. Seasoned rice or pasta mixes. Noodle soup cups. Boxed or frozen macaroni and cheese. Self-rising flour. Regular salted crackers. Vegetables Regular canned vegetables. Regular canned tomato sauce and paste. Regular tomato and vegetable juices. Frozen vegetables in sauces. Salted [place] fries. Olives. Angie Fava. Relishes. Sauerkraut. Salsa. Meat and Other Protein Products Salted, canned, smoked, spiced, or pickled meats, seafood, or fish. Bacon, ham, sausage, hot dogs, corned beef, chipped beef, and packaged luncheon meats. Salt pork. Jerky. Pickled herring. Anchovies, regular canned tuna, and sardines. Salted nuts. Dairy Processed cheese and cheese spreads. Cheese curds. Blue cheese and cottage cheese. Buttermilk.  Condiments Onion and garlic salt, seasoned salt, table salt, and sea salt. Canned and packaged gravies. Worcestershire sauce. Tartar sauce. Barbecue sauce. Teriyaki sauce. Soy sauce, including reduced sodium. Steak sauce. Fish sauce. Oyster sauce. Cocktail sauce. Horseradish that you find on the shelf. Regular ketchup and mustard. Meat flavorings and tenderizers. Bouillon cubes. Hot sauce. Tabasco sauce. Marinades. Taco seasonings. Relishes. Fats and Oils Regular salad dressings. Salted butter. Margarine. Ghee. Bacon fat.  Other Potato and tortilla chips. Corn chips and puffs. Salted popcorn and pretzels. Canned or dried soups. Pizza. Frozen entrees and pot pies.  The items listed above may not be a complete list of foods and beverages to avoid. Contact your dietitian for more information.   This information is not intended to replace advice given to you by your health care provider. Make sure you discuss any questions you have with your health care provider.   Document Released: 03/25/2002 Document Revised: 10/24/2014 Document Reviewed: 08/07/2013 Elsevier Interactive Patient Education  [employer].

## 2015-07-24 NOTE — Telephone Encounter (Signed)
Patient called stating that she would like to get a referral to pain clinic and that she is not happy with Re[facility]

## 2015-07-27 ENCOUNTER — Other Ambulatory Visit: Payer: Self-pay | Admitting: *Deleted

## 2015-07-27 ENCOUNTER — Telehealth: Payer: Self-pay | Admitting: Family

## 2015-07-27 NOTE — Patient Outreach (Signed)
Transition of care call #4. Pt is doing very well. She reports getting a new scale and she is weighing every day. She went to the grocery store and spent 2 1/2 hours reading labels and choosing more healthy food with as little sodium as possible. She is taking her health care needs very seriously and making better choices. I have encouraged her to continue to give 100% during her PT visits. I have advised her this will be my last call. I have asked her to remember me if she has any questions or problems that arise she can call me.  [name] [facility] [place] [phone]

## 2015-07-27 NOTE — Telephone Encounter (Signed)
Spoke with patient regarding her lab results. Continue current medications.

## 2015-07-28 NOTE — Telephone Encounter (Signed)
Recommend follow up appointment to assess pain and document for pain clinic referral.

## 2015-07-28 NOTE — Telephone Encounter (Signed)
Patient advised as directed below. Patient verbalized understanding. Patient scheduled for a OV on 07/30/2015.

## 2015-07-30 ENCOUNTER — Ambulatory Visit (INDEPENDENT_AMBULATORY_CARE_PROVIDER_SITE_OTHER): Payer: Medicare PPO | Admitting: Family Medicine

## 2015-07-30 ENCOUNTER — Ambulatory Visit: Payer: Medicare PPO | Admitting: Family Medicine

## 2015-07-30 ENCOUNTER — Encounter: Payer: Self-pay | Admitting: Family Medicine

## 2015-07-30 VITALS — BP 100/60 | HR 76 | Temp 98.1°F | Resp 19 | Wt 256.2 lb

## 2015-07-30 DIAGNOSIS — J9611 Chronic respiratory failure with hypoxia: Secondary | ICD-10-CM | POA: Diagnosis not present

## 2015-07-30 DIAGNOSIS — I5032 Chronic diastolic (congestive) heart failure: Secondary | ICD-10-CM | POA: Diagnosis not present

## 2015-07-30 DIAGNOSIS — M5136 Other intervertebral disc degeneration, lumbar region: Secondary | ICD-10-CM

## 2015-07-30 DIAGNOSIS — M51369 Other intervertebral disc degeneration, lumbar region without mention of lumbar back pain or lower extremity pain: Secondary | ICD-10-CM

## 2015-07-30 MED ORDER — METHOCARBAMOL 750 MG PO TABS: 750.0000 mg | ORAL_TABLET | Freq: Four times a day (QID) | ORAL | Status: DC

## 2015-07-30 NOTE — Progress Notes (Signed)
Patient: Theresa [name] Female    DOB: Oct 09, 1956   59 y.o.   MRN: [id number] Visit Date: 07/30/2015  Today's Provider: [name], PA   Chief Complaint  Patient presents with  . REFERRAL(Pain Clinic)   Subjective:    Back Pain This is a chronic problem. The current episode started more than 1 year ago. The problem occurs constantly. The problem has been gradually worsening since onset. The pain is present in the lumbar spine (patient with DDD and Arthritis). The quality of the pain is described as aching, burning, cramping, shooting and stabbing. The pain radiates to the right thigh and right foot. The pain is at a severity of 10/10. The pain is severe. The pain is the same all the time. The symptoms are aggravated by bending, lying down, coughing, position, sitting, standing, twisting and stress. Stiffness is present: Mostly when going to stand up from sitting on the right knee. Associated symptoms include tingling and weakness. Pertinent negatives include no numbness. (Tingling and burning at the back.) She has tried bed rest, muscle relaxant, walking, ice, home exercises and heat for the symptoms. The treatment provided no relief.   Patient needs a referral to the pain clinic for her back pain. Past Surgical History  Procedure Laterality Date  . Tubal ligation    . Abdominal hysterectomy    . Tonsillectomy    . Carpal tunnel release Left    Patient Active Problem List   Diagnosis Date Noted  . Chronic diastolic heart failure ([place]) 07/15/2015  . Leg pain 07/15/2015  . Chest pain 07/15/2015  . Tobacco abuse 07/15/2015  . Chronic respiratory failure with hypoxia ([place]) 04/28/2015  . Blood clotting disorder ([place]) 04/21/2015  . Chronic respiratory acidosis 04/18/2015  . Hypoventilation, idiopathic 07/16/2014  . Decreased motor strength 07/16/2014  . Clinical depression 07/16/2014  . Chronic rhinitis 07/16/2014  . Carpal tunnel syndrome 07/16/2014  . Anxiety state  07/16/2014  . Neuritis or radiculitis due to rupture of lumbar intervertebral disc 07/02/2014  . DDD (degenerative disc disease), lumbar 07/02/2014  . Nicotine addiction 02/27/2014  . CAFL (chronic airflow limitation) ([place]) 02/27/2014  . Essential (primary) hypertension 03/27/2008   Family History  Problem Relation Age of Onset  . Kidney disease Mother   . Prostate cancer Neg Hx   . Kidney cancer Brother   . Lung cancer Sister   . Coronary artery disease Mother     Allergies  Allergen Reactions  . Codeine Nausea And Vomiting   Previous Medications   ALBUTEROL (PROVENTIL HFA;VENTOLIN HFA) 108 (90 BASE) MCG/ACT INHALER    Inhale 2 puffs into the lungs every 6 (six) hours as needed for wheezing.   ALPRAZOLAM (XANAX) 0.5 MG TABLET    Take 1 tablet (0.5 mg total) by mouth at bedtime as needed for anxiety.   ASPIRIN-CAFFEINE (BC FAST PAIN RELIEF ARTHRITIS) 1000-65 MG PACK    Take 1 packet by mouth every 6 (six) hours as needed (Pain).   BISOPROLOL-HYDROCHLOROTHIAZIDE (ZIAC) 10-6.25 MG PER TABLET    TAKE 1 TABLET BY MOUTH DAILY   BUDESONIDE-FORMOTEROL (SYMBICORT) 160-4.5 MCG/ACT INHALER    Inhale 2 puffs into the lungs 2 (two) times daily.   ESTRADIOL (ESTRACE) 1 MG TABLET    Take 1 mg by mouth daily.   FUROSEMIDE (LASIX) 20 MG TABLET    Take 1 tablet (20 mg total) by mouth daily.   IPRATROPIUM-ALBUTEROL (DUONEB) 0.5-2.5 (3) MG/3ML SOLN    Take 3 mLs by  nebulization every 6 (six) hours.   NICOTINE (NICODERM CQ - DOSED IN MG/24 HOURS) 21 MG/24HR PATCH    Place 1 patch (21 mg total) onto the skin daily.   NON FORMULARY    Apply 1 application topically 4 (four) times daily as needed (joint/ muscle pain). Perform Cool & Soothing Pain Relieving Roll-On (Menthol 3.5% topical)   ROFLUMILAST (DALIRESP) 500 MCG TABS TABLET    Take 500 mcg by mouth daily.   SERTRALINE (ZOLOFT) 50 MG TABLET    Take 1 tablet (50 mg total) by mouth daily.   TIOTROPIUM (SPIRIVA) 18 MCG INHALATION CAPSULE    Place 1  capsule (18 mcg total) into inhaler and inhale daily.    Review of Systems  Constitutional: Positive for fatigue.  Respiratory: Positive for shortness of breath (uses oxygen and inhalers all day) and wheezing.   Cardiovascular: Negative for leg swelling.  Musculoskeletal: Positive for back pain.  Neurological: Positive for tingling and weakness. Negative for numbness.    Social History  Substance Use Topics  . Smoking status: Current Every Day Smoker -- 1.00 packs/day for 40 years    Types: Cigarettes  . Smokeless tobacco: Never Used     Comment: 30+ years, pt states she has cut back to 4-5 cigarettes a day  . Alcohol Use: No   Objective:   BP 100/60 mmHg  Pulse 76  Temp(Src) 98.1 F (36.7 C) (Oral)  Resp 19  Wt 256 lb 3.2 oz (116.212 kg)  SpO2 95%  Physical Exam  Constitutional: She is oriented to person, place, and time. She appears well-developed and well-nourished.  obese  HENT:  Head: Normocephalic.  Right Ear: External ear normal.  Left Ear: External ear normal.  Nose: Nose normal.  Eyes: Conjunctivae and EOM are normal.  Neck: Neck supple.  Cardiovascular: Normal rate, regular rhythm and normal heart sounds.   Pulmonary/Chest: She is in respiratory distress.  Distant breath sounds with wheezing. On oxygen by nasal cannula 24 hours a day.  Abdominal: Soft. Bowel sounds are normal.  Musculoskeletal: She exhibits edema.  Low back pain with transition from sitting to standing and any bending. Right knee pain from arthritis. SLR's creates right knee and back pain at 80 degrees.   Neurological: She is alert and oriented to person, place, and time.  Diminished DTR's in knees.      Assessment & Plan:     1. DDD (degenerative disc disease), lumbar Diagnosed with moderaet to severe DDD at L4-5 with mild spondylosis and mild disc bulge at L3-4 and disc desiccation with broad based disc bulge at L5-S1 on MRI per Dr. Randon Goldsmith on 06-09-14. Has had epidural injections twice by  Dr. Debby Freiberg around 07-02-14. Can't stand for a shower now and has to use a walker for any walking. Still trying Bio-Freeze, Icy Hot, BenGay, Naproxen, Percogesic or Ibuprofen without much relief. Requests referral back to a pain clinic. Will give Robaxin to try to decrease pain and spasms. May apply ice pack prn. - Ambulatory referral to Pain Clinic - methocarbamol (ROBAXIN) 750 MG tablet; Take 1 tablet (750 mg total) by mouth 4 (four) times daily.  Dispense: 40 tablet; Refill: 0  2. Chronic respiratory failure with hypoxia ([place]) Still having difficulty with breathing but better on oxygen 24 hours a day at 2-3 LPM. Continues to use Spiriva, Daliresp, Duoneb, Symbicort and Albuterol prn rescue. Continue follow up with Dr. Raul Del (pulmonologist).  3. Chronic diastolic heart failure ([facility]) Continues to have some peripheral edema. Using  Lasix qd regularly. Has next follow up with cardiologist (Dr. Clayborn Bigness) on 08-24-15.       [name], PA  [facility]

## 2015-08-03 ENCOUNTER — Encounter: Payer: Self-pay | Admitting: *Deleted

## 2015-08-03 NOTE — Patient Outreach (Signed)
[facility]) Care Management  08/03/2015  Verneta Hamidi 1956-08-01 [id number]   Notification from Kathie Rhodes, RN to close case as no further interventions needed.  Thanks, Ronnell Freshwater. [place], [place] Assistant Phone: [phone] Fax: [phone]

## 2015-08-07 ENCOUNTER — Telehealth: Payer: Self-pay

## 2015-08-07 DIAGNOSIS — I10 Essential (primary) hypertension: Secondary | ICD-10-CM | POA: Diagnosis not present

## 2015-08-07 DIAGNOSIS — M5030 Other cervical disc degeneration, unspecified cervical region: Secondary | ICD-10-CM | POA: Diagnosis not present

## 2015-08-07 DIAGNOSIS — J441 Chronic obstructive pulmonary disease with (acute) exacerbation: Secondary | ICD-10-CM | POA: Diagnosis not present

## 2015-08-07 MED ORDER — HYDROCODONE-IBUPROFEN 5-200 MG PO TABS: 1.0000 | ORAL_TABLET | Freq: Three times a day (TID) | ORAL | Status: DC | PRN

## 2015-08-07 NOTE — Telephone Encounter (Signed)
Pt called and states she saw you 1 week ago and states Robaxin is not helping her back pain, she has tried OTC medications also with no relief. IS there something else you can prescribe for her? Please review-aa

## 2015-08-07 NOTE — Telephone Encounter (Signed)
Pain in lower back is intensifying. No relief from Robaxin and Aleve. Can't walk across the room at home without extra pain and needing to sit down. Still having to use the walker at home. Awaiting call back about referral to a local pain clinic. Has had steroid injections in spine, multiple muscle relaxant trials and NSAID's without relief. No help from muscle rubs, back braces, massages, ice packs or OTC TENS units. Gets nausea from codeine but has tried Vicodin in the past without stomach upset. Don't want to take much of this because of lung problems. Requests a small supply while waiting for pain clinic referral. Will come by the office to pick up a written prescription tomorrow.

## 2015-08-16 IMAGING — CR DG CHEST 1V PORT
1 series · 1 of 1 positions shown · non-contrast
Comparison: 10/01/2014

CLINICAL DATA: Intermittent chest pain

EXAM:
PORTABLE CHEST - 1 VIEW

[ap]
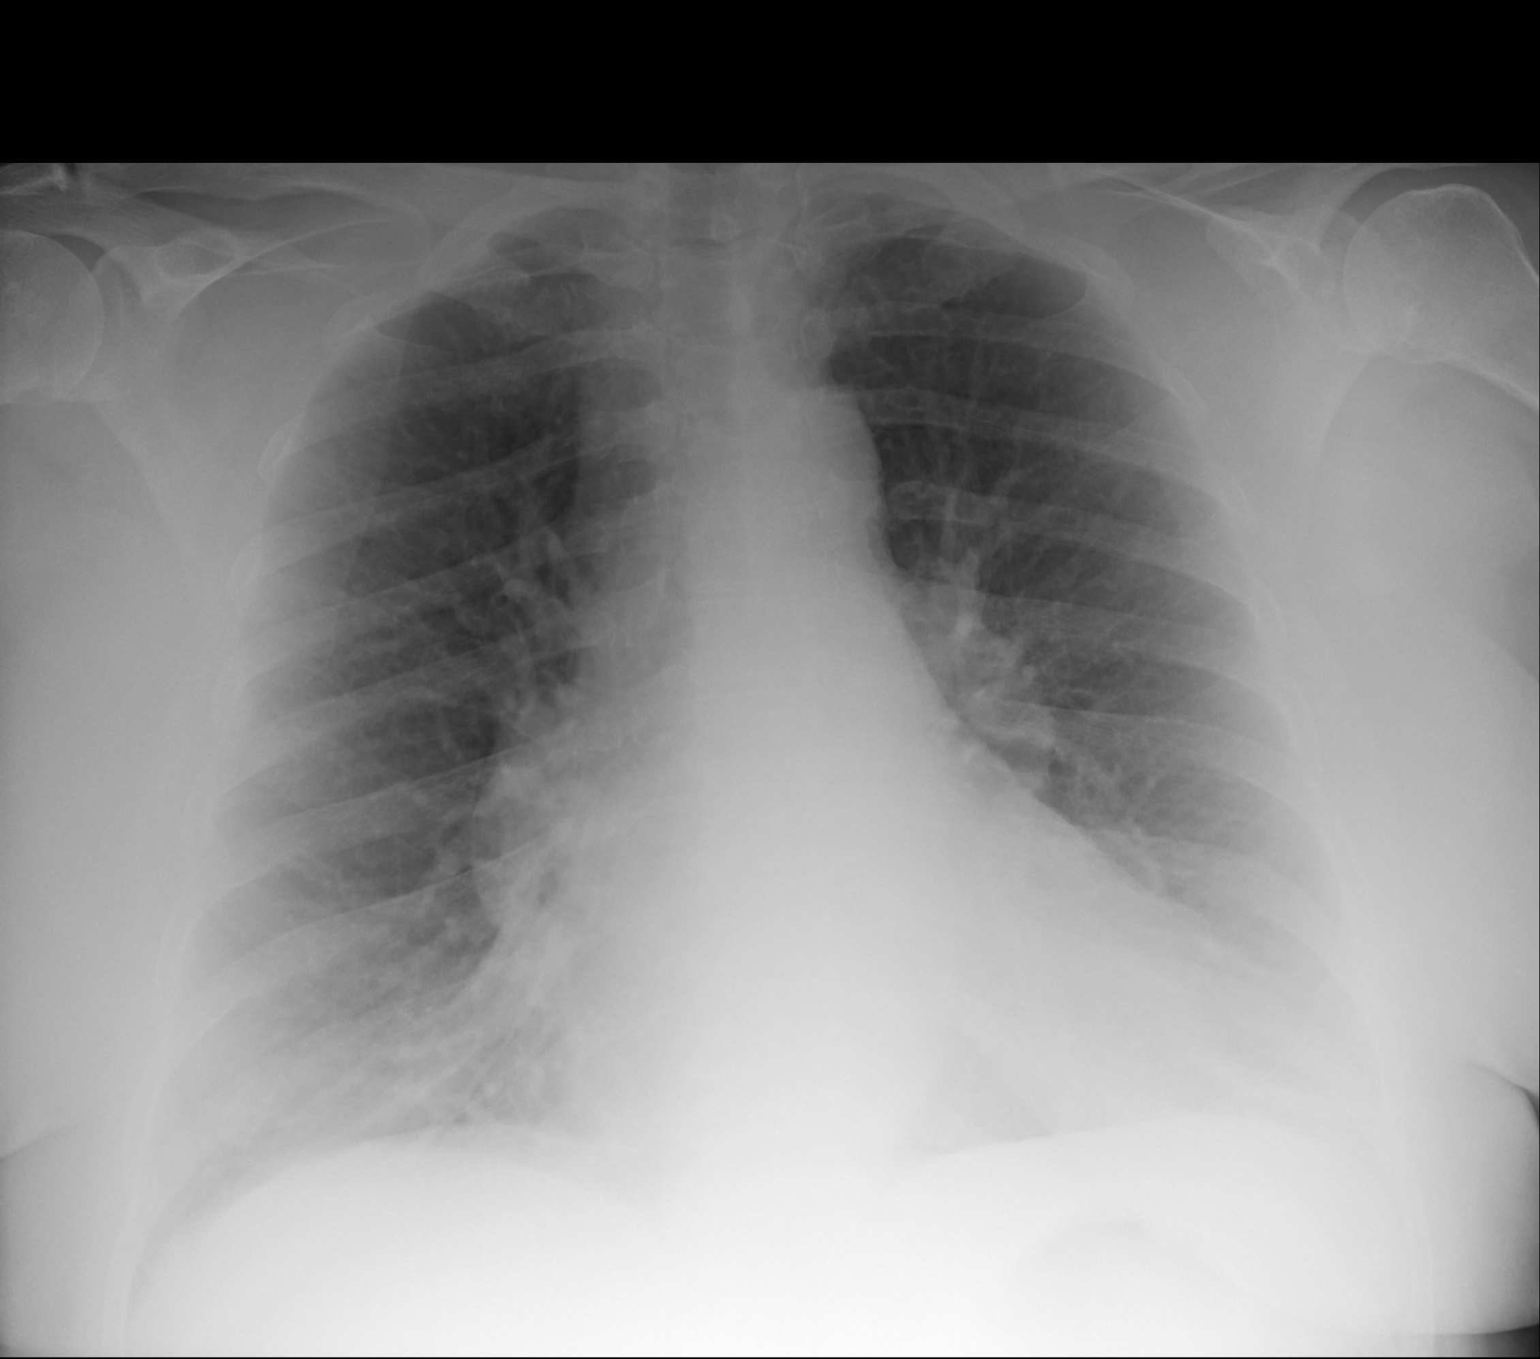

[1 of 1 positions shown; findings below may reference images not displayed]

FINDINGS: Heart size upper normal to mildly enlarged. Hilar fullness is
similar to prior and favored to reflect prominent vessels. Mild
interstitial prominence. Mild haziness over the lower lobes. No
overt effusion. No pneumothorax. Multilevel degenerative changes.
IMPRESSION: Mild interstitial prominence in part reflects COPD. Superimposed
interstitial edema or atypical/viral infection not excluded in the
appropriate clinical setting.

Hazy lung base opacities may be accentuated by overlying soft
tissues. Atelectasis or early pneumonia not excluded. Recommend a
follow-up radiograph in 3-4 weeks.

Cardiomediastinal contours are similar to prior.

## 2015-08-17 IMAGING — US US EXTREM LOW VENOUS BILAT
1 series · 13 of 24 positions shown · non-contrast
Comparison: Prior bilateral duplex venous ultrasound 02/28/2013

CLINICAL DATA: 58-year-old female with 1 year history of bilateral
lower extremity pain and swelling



[Series 1: us extrem low venous bilat · 0.08mm/px · 13 of 88 slices shown]
[im 1/88]
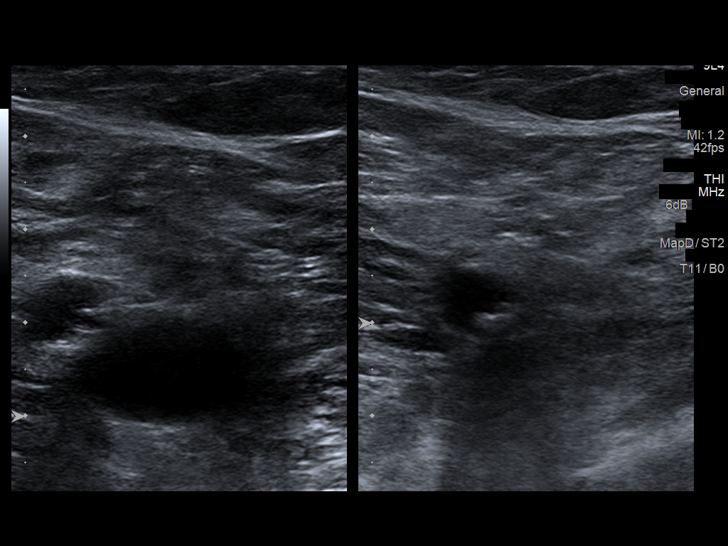
[im 8/88]
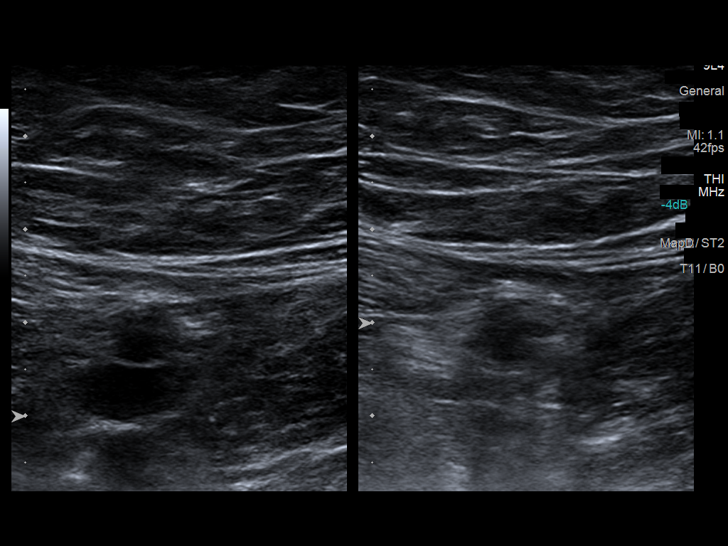
[im 16/88]
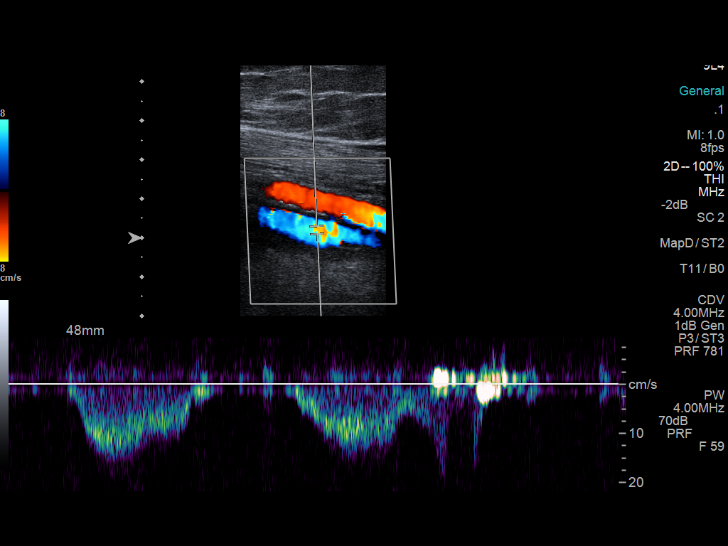
[im 23/88]
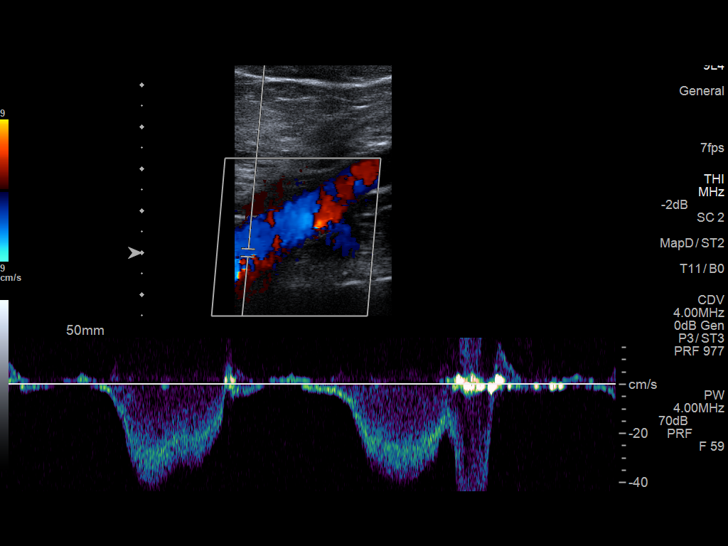
[im 31/88]
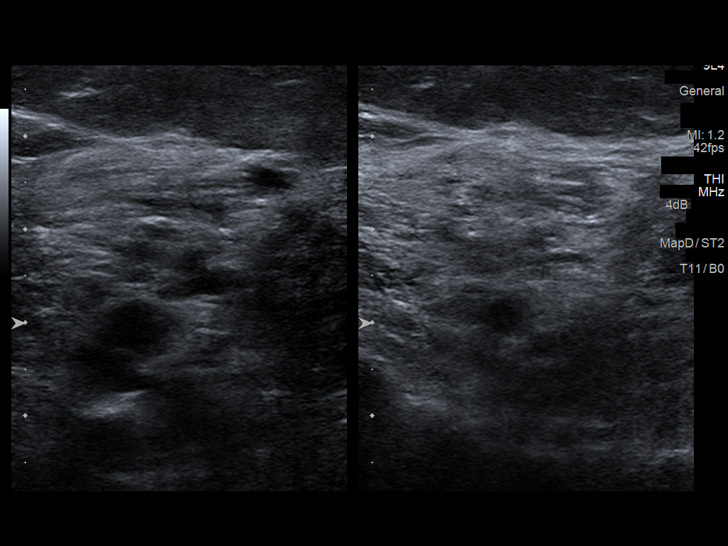
[im 38/88]
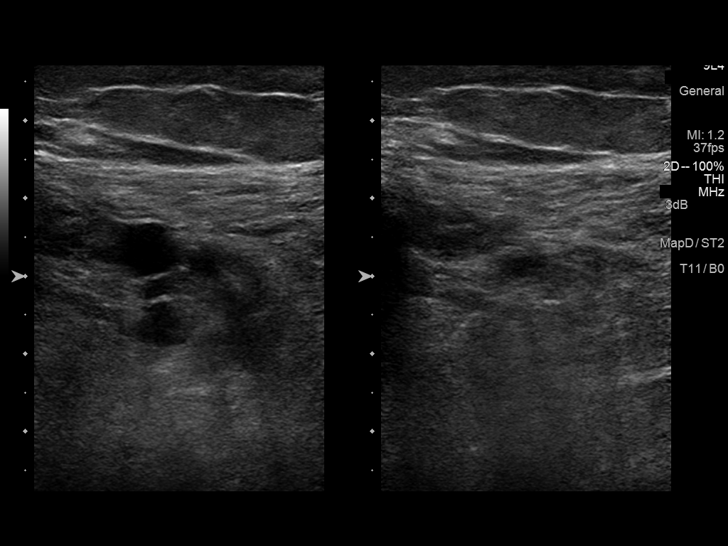
[im 46/88]
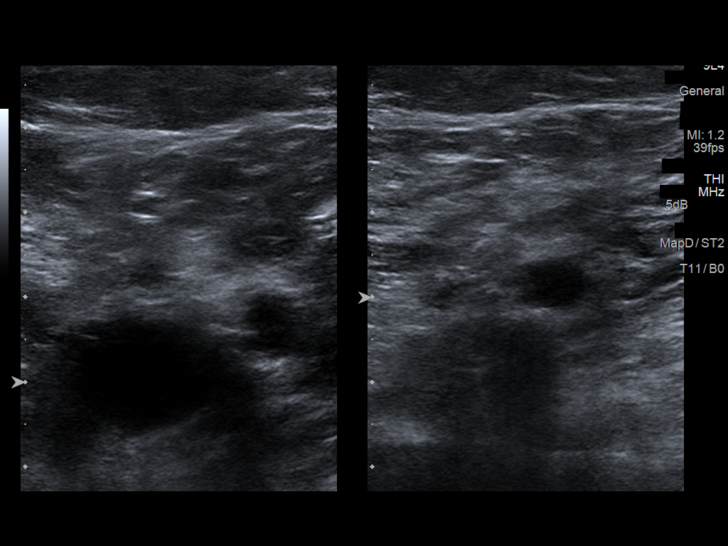
[im 50/88]
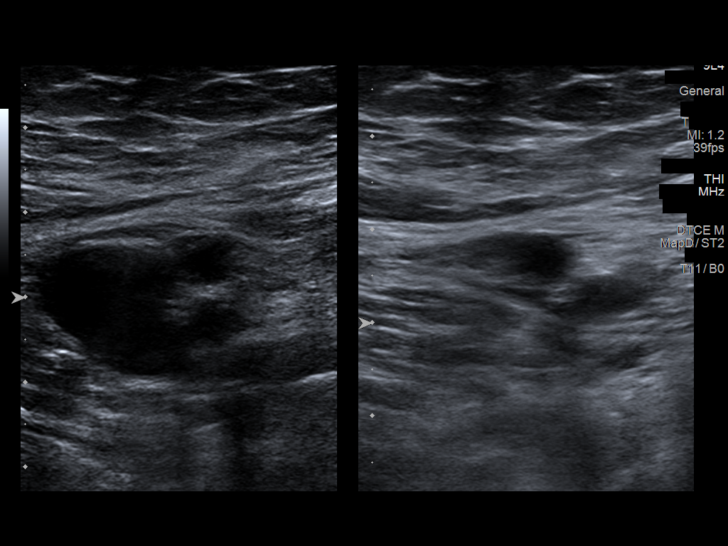
[im 57/88]
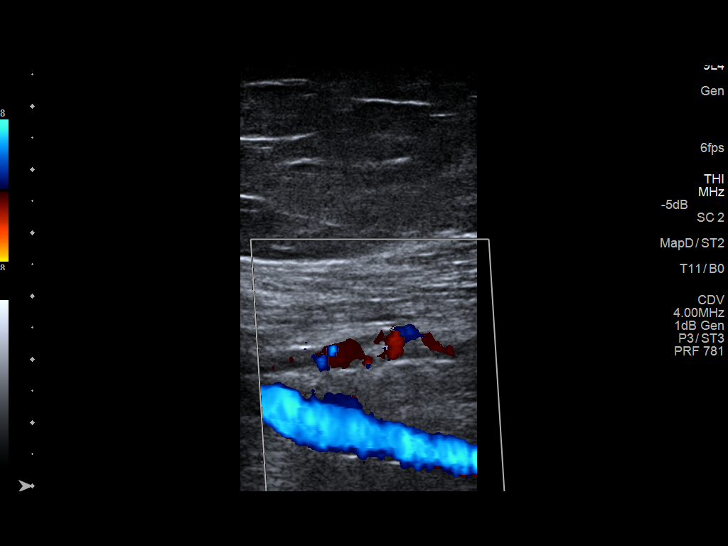
[im 65/88]
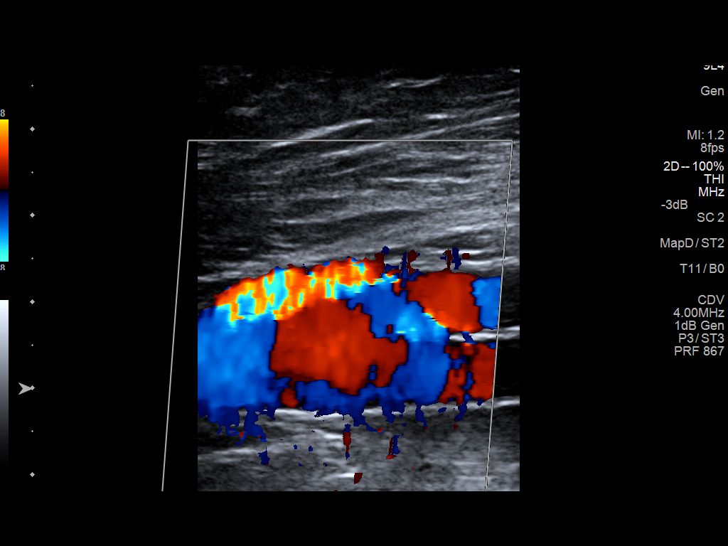
[im 72/88]
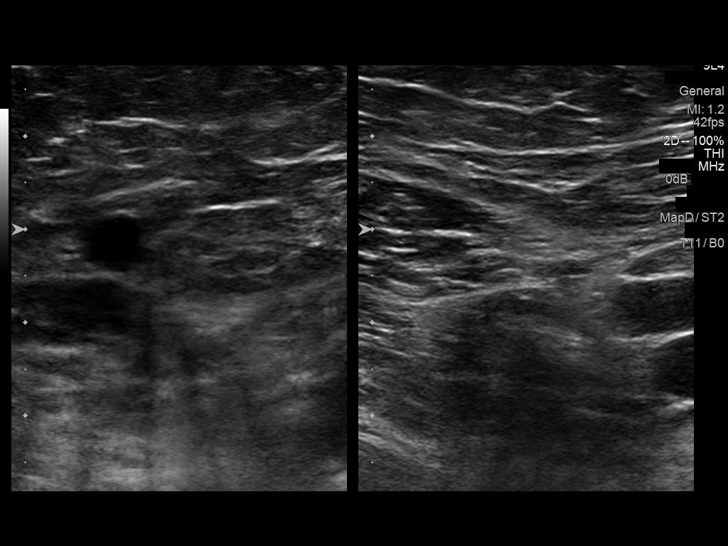
[im 80/88]
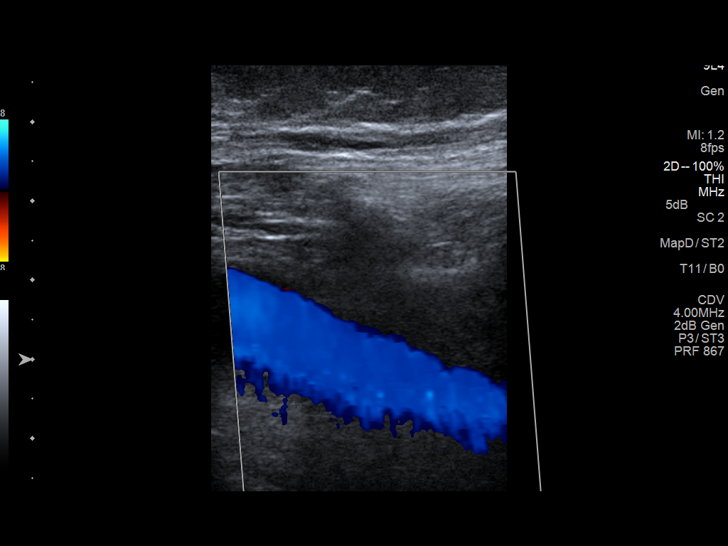
[im 88/88]
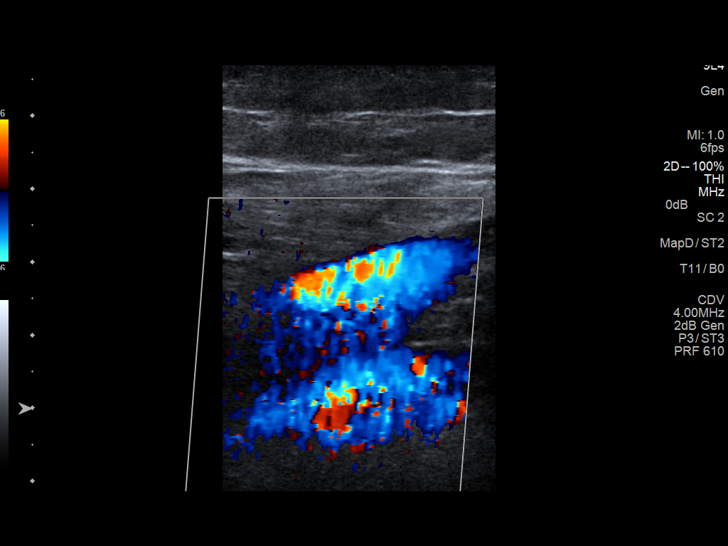

[13 of 24 positions shown; findings below may reference images not displayed]

FINDINGS: RIGHT LOWER EXTREMITY

Common Femoral Vein: No evidence of thrombus. Normal
compressibility, respiratory phasicity and response to augmentation.

Saphenofemoral Junction: No evidence of thrombus. Normal
compressibility and flow on color Doppler imaging.

Profunda Femoral Vein: No evidence of thrombus. Normal
compressibility and flow on color Doppler imaging.

Femoral Vein: No evidence of thrombus. Normal compressibility,
respiratory phasicity and response to augmentation.

Popliteal Vein: No evidence of thrombus. Normal compressibility,
respiratory phasicity and response to augmentation.

Calf Veins: No evidence of thrombus. Normal compressibility and flow
on color Doppler imaging.

Superficial Great Saphenous Vein: No evidence of thrombus. Normal
compressibility and flow on color Doppler imaging.

Venous Reflux:  None.

Other Findings:  None.

LEFT LOWER EXTREMITY

Common Femoral Vein: No evidence of thrombus. Normal
compressibility, respiratory phasicity and response to augmentation.

Saphenofemoral Junction: No evidence of thrombus. Normal
compressibility and flow on color Doppler imaging.

Profunda Femoral Vein: No evidence of thrombus. Normal
compressibility and flow on color Doppler imaging.

Femoral Vein: No evidence of thrombus. Normal compressibility,
respiratory phasicity and response to augmentation.

Popliteal Vein: No evidence of thrombus. Normal compressibility,
respiratory phasicity and response to augmentation.

Calf Veins: No evidence of thrombus. Normal compressibility and flow
on color Doppler imaging.

Superficial Great Saphenous Vein: No evidence of thrombus. Normal
compressibility and flow on color Doppler imaging.

Venous Reflux:  None.

Other Findings:  None.
IMPRESSION: No evidence of deep venous thrombosis.

## 2015-08-26 ENCOUNTER — Encounter: Payer: Self-pay | Admitting: Family

## 2015-08-26 ENCOUNTER — Ambulatory Visit: Payer: Medicare PPO | Attending: Family | Admitting: Family

## 2015-08-26 VITALS — BP 111/65 | HR 82 | Resp 20 | Ht 63.0 in | Wt 258.0 lb

## 2015-08-26 DIAGNOSIS — E669 Obesity, unspecified: Secondary | ICD-10-CM | POA: Insufficient documentation

## 2015-08-26 DIAGNOSIS — Z6841 Body Mass Index (BMI) 40.0 and over, adult: Secondary | ICD-10-CM | POA: Diagnosis not present

## 2015-08-26 DIAGNOSIS — F329 Major depressive disorder, single episode, unspecified: Secondary | ICD-10-CM | POA: Insufficient documentation

## 2015-08-26 DIAGNOSIS — F17210 Nicotine dependence, cigarettes, uncomplicated: Secondary | ICD-10-CM | POA: Diagnosis not present

## 2015-08-26 DIAGNOSIS — I509 Heart failure, unspecified: Secondary | ICD-10-CM | POA: Diagnosis present

## 2015-08-26 DIAGNOSIS — M5416 Radiculopathy, lumbar region: Secondary | ICD-10-CM | POA: Diagnosis not present

## 2015-08-26 DIAGNOSIS — I10 Essential (primary) hypertension: Secondary | ICD-10-CM | POA: Insufficient documentation

## 2015-08-26 DIAGNOSIS — M5136 Other intervertebral disc degeneration, lumbar region: Secondary | ICD-10-CM

## 2015-08-26 DIAGNOSIS — M51369 Other intervertebral disc degeneration, lumbar region without mention of lumbar back pain or lower extremity pain: Secondary | ICD-10-CM

## 2015-08-26 DIAGNOSIS — J449 Chronic obstructive pulmonary disease, unspecified: Secondary | ICD-10-CM | POA: Diagnosis not present

## 2015-08-26 DIAGNOSIS — J9611 Chronic respiratory failure with hypoxia: Secondary | ICD-10-CM

## 2015-08-26 DIAGNOSIS — I5032 Chronic diastolic (congestive) heart failure: Secondary | ICD-10-CM

## 2015-08-26 DIAGNOSIS — F32A Depression, unspecified: Secondary | ICD-10-CM

## 2015-08-26 DIAGNOSIS — Z720 Tobacco use: Secondary | ICD-10-CM

## 2015-08-26 NOTE — Patient Instructions (Addendum)
Continue weighing daily and call for an overnight weight gain of > 2 pounds or a weekly weight gain of >5 pounds.    Smoking Cessation Quitting smoking is important to your health and has many advantages. However, it is not always easy to quit since nicotine is a very addictive drug. Oftentimes, people try 3 times or more before being able to quit. This document explains the best ways for you to prepare to quit smoking. Quitting takes hard work and a lot of effort, but you can do it. ADVANTAGES OF QUITTING SMOKING  You will live longer, feel better, and live better.  Your body will feel the impact of quitting smoking almost immediately.  Within 20 minutes, blood pressure decreases. Your pulse returns to its normal level.  After 8 hours, carbon monoxide levels in the blood return to normal. Your oxygen level increases.  After 24 hours, the chance of having a heart attack starts to decrease. Your breath, hair, and body stop smelling like smoke.  After 48 hours, damaged nerve endings begin to recover. Your sense of taste and smell improve.  After 72 hours, the body is virtually free of nicotine. Your bronchial tubes relax and breathing becomes easier.  After 2 to 12 weeks, lungs can hold more air. Exercise becomes easier and circulation improves.  The risk of having a heart attack, stroke, cancer, or lung disease is greatly reduced.  After 1 year, the risk of coronary heart disease is cut in half.  After 5 years, the risk of stroke falls to the same as a nonsmoker.  After 10 years, the risk of lung cancer is cut in half and the risk of other cancers decreases significantly.  After 15 years, the risk of coronary heart disease drops, usually to the level of a nonsmoker.  If you are pregnant, quitting smoking will improve your chances of having a healthy baby.  The people you live with, especially any children, will be healthier.  You will have extra money to spend on things other  than cigarettes. QUESTIONS TO THINK ABOUT BEFORE ATTEMPTING TO QUIT You may want to talk about your answers with your health care provider.  Why do you want to quit?  If you tried to quit in the past, what helped and what did not?  What will be the most difficult situations for you after you quit? How will you plan to handle them?  Who can help you through the tough times? Your family? Friends? A health care provider?  What pleasures do you get from smoking? What ways can you still get pleasure if you quit? Here are some questions to ask your health care provider:  How can you help me to be successful at quitting?  What medicine do you think would be best for me and how should I take it?  What should I do if I need more help?  What is smoking withdrawal like? How can I get information on withdrawal? GET READY  Set a quit date.  Change your environment by getting rid of all cigarettes, ashtrays, matches, and lighters in your home, car, or work. Do not let people smoke in your home.  Review your past attempts to quit. Think about what worked and what did not. GET SUPPORT AND ENCOURAGEMENT You have a better chance of being successful if you have help. You can get support in many ways.  Tell your family, friends, and coworkers that you are going to quit and need their support. Ask   not to smoke around you.  Get individual, group, or telephone counseling and support. Programs are available at [employer] and health centers. Call your local health department for information about programs in your area.  Spiritual beliefs and practices may help some smokers quit.  Download a "quit meter" on your computer to keep track of quit statistics, such as how long you have gone without smoking, cigarettes not smoked, and money saved.  Get a self-help book about quitting smoking and staying off tobacco. Hudson yourself from urges to smoke. Talk to  someone, go for a walk, or occupy your time with a task.  Change your normal routine. Take a different route to work. Drink tea instead of coffee. Eat breakfast in a different place.  Reduce your stress. Take a hot bath, exercise, or read a book.  Plan something enjoyable to do every day. Reward yourself for not smoking.  Explore interactive web-based programs that specialize in helping you quit. GET MEDICINE AND USE IT CORRECTLY Medicines can help you stop smoking and decrease the urge to smoke. Combining medicine with the above behavioral methods and support can greatly increase your chances of successfully quitting smoking.  Nicotine replacement therapy helps deliver nicotine to your body without the negative effects and risks of smoking. Nicotine replacement therapy includes nicotine gum, lozenges, inhalers, nasal sprays, and skin patches. Some may be available over-the-counter and others require a prescription.  Antidepressant medicine helps people abstain from smoking, but how this works is unknown. This medicine is available by prescription.  Nicotinic receptor partial agonist medicine simulates the effect of nicotine in your brain. This medicine is available by prescription. Ask your health care provider for advice about which medicines to use and how to use them based on your health history. Your health care provider will tell you what side effects to look out for if you choose to be on a medicine or therapy. Carefully read the information on the package. Do not use any other product containing nicotine while using a nicotine replacement product.  RELAPSE OR DIFFICULT SITUATIONS Most relapses occur within the first 3 months after quitting. Do not be discouraged if you start smoking again. Remember, most people try several times before finally quitting. You may have symptoms of withdrawal because your body is used to nicotine. You may crave cigarettes, be irritable, feel very hungry, cough  often, get headaches, or have difficulty concentrating. The withdrawal symptoms are only temporary. They are strongest when you first quit, but they will go away within 10-14 days. To reduce the chances of relapse, try to:  Avoid drinking alcohol. Drinking lowers your chances of successfully quitting.  Reduce the amount of caffeine you consume. Once you quit smoking, the amount of caffeine in your body increases and can give you symptoms, such as a rapid heartbeat, sweating, and anxiety.  Avoid smokers because they can make you want to smoke.  Do not let weight gain distract you. Many smokers will gain weight when they quit, usually less than 10 pounds. Eat a healthy diet and stay active. You can always lose the weight gained after you quit.  Find ways to improve your mood other than smoking. FOR MORE INFORMATION  www.smokefree.gov  Document Released: 09/27/2001 Document Revised: 02/17/2014 Document Reviewed: 01/12/2012 [facility] Patient Information 2015 [place], [place]. This information is not intended to replace advice given to you by your health care provider. Make sure you discuss any questions you have with your health  care provider.

## 2015-08-26 NOTE — Progress Notes (Signed)
Subjective:    Patient ID: Theresa [name], female    DOB: 05/16/1956, 59 y.o.   MRN: [id number]  Congestive Heart Failure Presents for follow-up visit. The disease course has been stable. Associated symptoms include abdominal pain (RUQ pain at times), edema, fatigue, orthopnea (sleeping in recliner), palpitations and shortness of breath. Pertinent negatives include no chest pain or chest pressure. The symptoms have been stable. Past treatments include beta blockers, oxygen and salt and fluid restriction. The treatment provided mild relief. Her past medical history is significant for chronic lung disease and HTN. She has one 1st degree relative with heart disease. Compliance with total regimen is 76-100%.  Back Pain This is a chronic problem. The current episode started more than 1 month ago. The problem occurs constantly. The problem is unchanged. The pain is present in the lumbar spine. The quality of the pain is described as shooting and stabbing. The pain is at a severity of 8/10. The pain is severe. The pain is the same all the time. The symptoms are aggravated by position. Associated symptoms include abdominal pain (RUQ pain at times), numbness (right hand and arm at times) and paresthesias (right arm/hand at times). Pertinent negatives include no chest pain, fever or headaches. Risk factors include lack of exercise and obesity. She has tried analgesics and NSAIDs for the symptoms. The treatment provided mild relief.  Other This is a recurrent (depression) problem. The current episode started more than 1 month ago. The problem occurs daily. The problem has been unchanged. Associated symptoms include abdominal pain (RUQ pain at times), coughing (clear to yellow), fatigue, neck pain and numbness (right hand and arm at times). Pertinent negatives include no change in bowel habit, chest pain, congestion, fever, headaches, rash or sore throat. The symptoms are aggravated by stress. She has tried relaxation  for the symptoms. The treatment provided no relief.    Past Medical History  Diagnosis Date  . COPD (chronic obstructive pulmonary disease) ([place])   . Hypertension   . Gastritis   . Lumbar radiculitis   . Muscle weakness   . DDD (degenerative disc disease), cervical   . Depression   . Chronic rhinitis   . Chronic generalized abdominal pain   . Anxiety   . Edema   . Carpal tunnel syndrome   . Low back pain   . Hematuria   . Malodorous urine   . Sensory urge incontinence   . Obesity   . Renal cyst   . Tobacco abuse   . CHF (congestive heart failure) [facility])     Past Surgical History  Procedure Laterality Date  . Tubal ligation    . Abdominal hysterectomy    . Tonsillectomy    . Carpal tunnel release Left     Family History  Problem Relation Age of Onset  . Kidney disease Mother   . Prostate cancer Neg Hx   . Kidney cancer Brother   . Lung cancer Sister   . Coronary artery disease Mother     Social History  Substance Use Topics  . Smoking status: Current Every Day Smoker -- 1.00 packs/day for 40 years    Types: Cigarettes  . Smokeless tobacco: Never Used     Comment: 30+ years, pt states she has cut back to 4-5 cigarettes a day  . Alcohol Use: No    Allergies  Allergen Reactions  . Codeine Nausea And Vomiting    Prior to Admission medications   Medication Sig Start Date End Date  Taking? Authorizing Provider  albuterol (PROVENTIL HFA;VENTOLIN HFA) 108 (90 BASE) MCG/ACT inhaler Inhale 2 puffs into the lungs every 6 (six) hours as needed for wheezing.   Yes Historical Provider, MD  ALPRAZolam Duanne Moron) 0.5 MG tablet Take 1 tablet (0.5 mg total) by mouth at bedtime as needed for anxiety. 07/04/15  Yes Fritzi Mandes, MD  Aspirin-Caffeine (BC FAST PAIN RELIEF ARTHRITIS) 1000-65 MG PACK Take 1 packet by mouth every 6 (six) hours as needed (Pain).   Yes Historical Provider, MD  bisoprolol-hydrochlorothiazide [facility]) 10-6.25 MG per tablet TAKE 1 TABLET BY MOUTH DAILY 05/31/15   Yes Vickki Muff Chrismon, PA  budesonide-formoterol (SYMBICORT) 160-4.5 MCG/ACT inhaler Inhale 2 puffs into the lungs 2 (two) times daily.   Yes Historical Provider, MD  estradiol (ESTRACE) 1 MG tablet Take 1 mg by mouth daily.   Yes Historical Provider, MD  furosemide (LASIX) 20 MG tablet Take 1 tablet (20 mg total) by mouth daily. 07/24/15  Yes [name], FNP  hydrocodone-ibuprofen (VICOPROFEN) 5-200 MG tablet Take 1 tablet by mouth every 8 (eight) hours as needed for pain. 08/07/15  Yes Dennis E Chrismon, PA  ipratropium-albuterol (DUONEB) 0.5-2.5 (3) MG/3ML SOLN Take 3 mLs by nebulization every 6 (six) hours. 04/21/15  Yes Srikar Sudini, MD  nicotine (NICODERM CQ - DOSED IN MG/24 HOURS) 21 mg/24hr patch Place 1 patch (21 mg total) onto the skin daily. 07/04/15  Yes Fritzi Mandes, MD  NON FORMULARY Apply 1 application topically 4 (four) times daily as needed (joint/ muscle pain). Perform Cool & Soothing Pain Relieving Roll-On (Menthol 3.5% topical)   Yes Historical Provider, MD  roflumilast (DALIRESP) 500 MCG TABS tablet Take 500 mcg by mouth daily.   Yes Historical Provider, MD  sertraline (ZOLOFT) 50 MG tablet Take 1 tablet (50 mg total) by mouth daily. 07/10/15  Yes Dennis E Chrismon, PA  tiotropium (SPIRIVA) 18 MCG inhalation capsule Place 1 capsule (18 mcg total) into inhaler and inhale daily. 07/04/15  Yes Fritzi Mandes, MD     Review of Systems  Constitutional: Positive for fatigue. Negative for fever and appetite change.  HENT: Positive for postnasal drip. Negative for congestion, sinus pressure and sore throat.   Eyes: Negative.   Respiratory: Positive for cough (clear to yellow), shortness of breath and wheezing. Negative for chest tightness.   Cardiovascular: Positive for palpitations and leg swelling. Negative for chest pain.  Gastrointestinal: Positive for abdominal pain (RUQ pain at times). Negative for abdominal distention and change in bowel habit.  Endocrine: Negative.    Genitourinary: Negative.   Musculoskeletal: Positive for back pain and neck pain.  Skin: Negative.  Negative for rash.  Allergic/Immunologic: Negative.   Neurological: Positive for numbness (right hand and arm at times) and paresthesias (right arm/hand at times). Negative for dizziness, light-headedness and headaches.  Hematological: Negative for adenopathy. Bruises/bleeds easily.  Psychiatric/Behavioral: Positive for sleep disturbance (sleeping in recliner with oxygen and bipap nightly) and dysphoric mood ("tired of being in pain constantly"). Negative for suicidal ideas. The patient is nervous/anxious.        Objective:   Physical Exam  Constitutional: She is oriented to person, place, and time. She appears well-developed and well-nourished.  HENT:  Head: Normocephalic and atraumatic.  Eyes: Conjunctivae are normal. Pupils are equal, round, and reactive to light.  Neck: Normal range of motion. Neck supple.  Cardiovascular: Normal rate and regular rhythm.   Pulmonary/Chest: Effort normal. She has no wheezes. She has rhonchi in the right upper field and the  left upper field. She has no rales.  Abdominal: Soft. She exhibits no distension. There is no tenderness.  Musculoskeletal: She exhibits edema (1+ pitting edema bilaterally with R>L). She exhibits no tenderness.  Neurological: She is alert and oriented to person, place, and time.  Skin: Skin is warm and dry.  Psychiatric: Her behavior is normal. She exhibits a depressed mood.  Tearful when talking about her health   Nursing note and vitals reviewed.   BP 111/65 mmHg  Pulse 82  Resp 20  Ht 5' 3" (1.6 m)  Wt 258 lb (117.028 kg)  BMI 45.71 kg/m2  SpO2 95%       Assessment & Plan:  1: Chronic heart failure with preserved ejection fraction- Patient presents with continued fatigue and shortness of breath upon exertion. She feels like the symptoms may be better as she walked into the office with her cane instead of using a  wheelchair. She did have to sit down for a few minutes after getting into the office to rest. She continues to weigh daily and says that she has gained some weight but feels like it's because she isn't able to exercise. By our scale, she has gained 8 pounds in the last month. She denies any overnight weight gains. She says that she isn't adding any salt to her food and is closely reading food labels. She does elevate her legs when she's at home but continues to have some pedal edema. She isn't sure if she has the TED hose at home but does have ACE wraps so she was instructed on how to wrap her legs with the ACE wraps with removing them at bedtime. She did receive her flu and pneumonia vaccine already.  2: COPD- She continues to use her inhalers and does notice occasional wheezing at times. She does wear her oxygen around the clock and also uses bipap at night. 3: Degenerative disc disease- She reports continuing to be in chronic severe pain all the time which limits her ability to do anything. She has an initial appointment at the pain clinic on 09/16/15. 4: Tobacco use- Patient continues to smoke but says that she's smoking less. She thinks that she's smoking about 1/3 ppd of cigarettes. She does say that she smokes outside away from her oxygen tank/tubing. Does have a nicotine patch that she'll use on occasion. Discussed complete cessation for 4 minutes with her. 5: Depression- Patient is tearful when discussing her health and the constant pain that she's in. She also said that she had to quit her job a few years ago and she really didn't want to quit because she really enjoyed her job. Now she feels like she's unable to do anything. Emotional support offered and encouraged her to speak with her PCP regarding possible change/titration of her antidepressant. Denies suicidal thoughts/plans.  Return in 2 months or sooner for any questions/problems before then.

## 2015-09-16 ENCOUNTER — Ambulatory Visit: Payer: Medicare PPO | Attending: Pain Medicine | Admitting: Pain Medicine

## 2015-09-16 ENCOUNTER — Other Ambulatory Visit: Payer: Self-pay | Admitting: Pain Medicine

## 2015-09-16 ENCOUNTER — Encounter: Payer: Self-pay | Admitting: Pain Medicine

## 2015-09-16 VITALS — BP 108/45 | HR 80 | Temp 97.7°F | Resp 22 | Ht 63.0 in | Wt 265.0 lb

## 2015-09-16 DIAGNOSIS — Z5181 Encounter for therapeutic drug level monitoring: Secondary | ICD-10-CM

## 2015-09-16 DIAGNOSIS — R52 Pain, unspecified: Secondary | ICD-10-CM

## 2015-09-16 DIAGNOSIS — M545 Low back pain, unspecified: Secondary | ICD-10-CM | POA: Insufficient documentation

## 2015-09-16 DIAGNOSIS — G894 Chronic pain syndrome: Secondary | ICD-10-CM

## 2015-09-16 DIAGNOSIS — M792 Neuralgia and neuritis, unspecified: Secondary | ICD-10-CM

## 2015-09-16 DIAGNOSIS — F4542 Pain disorder with related psychological factors: Secondary | ICD-10-CM

## 2015-09-16 DIAGNOSIS — M5412 Radiculopathy, cervical region: Secondary | ICD-10-CM | POA: Diagnosis not present

## 2015-09-16 DIAGNOSIS — I509 Heart failure, unspecified: Secondary | ICD-10-CM | POA: Insufficient documentation

## 2015-09-16 DIAGNOSIS — M47816 Spondylosis without myelopathy or radiculopathy, lumbar region: Secondary | ICD-10-CM | POA: Insufficient documentation

## 2015-09-16 DIAGNOSIS — Z9889 Other specified postprocedural states: Secondary | ICD-10-CM | POA: Insufficient documentation

## 2015-09-16 DIAGNOSIS — F329 Major depressive disorder, single episode, unspecified: Secondary | ICD-10-CM | POA: Diagnosis not present

## 2015-09-16 DIAGNOSIS — M79601 Pain in right arm: Secondary | ICD-10-CM

## 2015-09-16 DIAGNOSIS — M549 Dorsalgia, unspecified: Secondary | ICD-10-CM | POA: Insufficient documentation

## 2015-09-16 DIAGNOSIS — M5116 Intervertebral disc disorders with radiculopathy, lumbar region: Secondary | ICD-10-CM | POA: Insufficient documentation

## 2015-09-16 DIAGNOSIS — Z79899 Other long term (current) drug therapy: Secondary | ICD-10-CM

## 2015-09-16 DIAGNOSIS — Z7189 Other specified counseling: Secondary | ICD-10-CM

## 2015-09-16 DIAGNOSIS — M79606 Pain in leg, unspecified: Secondary | ICD-10-CM

## 2015-09-16 DIAGNOSIS — G8929 Other chronic pain: Secondary | ICD-10-CM | POA: Diagnosis not present

## 2015-09-16 DIAGNOSIS — M5416 Radiculopathy, lumbar region: Secondary | ICD-10-CM

## 2015-09-16 DIAGNOSIS — J449 Chronic obstructive pulmonary disease, unspecified: Secondary | ICD-10-CM | POA: Diagnosis not present

## 2015-09-16 DIAGNOSIS — I10 Essential (primary) hypertension: Secondary | ICD-10-CM | POA: Insufficient documentation

## 2015-09-16 DIAGNOSIS — F1190 Opioid use, unspecified, uncomplicated: Secondary | ICD-10-CM | POA: Diagnosis not present

## 2015-09-16 DIAGNOSIS — Z5189 Encounter for other specified aftercare: Secondary | ICD-10-CM

## 2015-09-16 DIAGNOSIS — M79605 Pain in left leg: Secondary | ICD-10-CM

## 2015-09-16 DIAGNOSIS — Z79891 Long term (current) use of opiate analgesic: Secondary | ICD-10-CM

## 2015-09-16 DIAGNOSIS — M25511 Pain in right shoulder: Secondary | ICD-10-CM | POA: Diagnosis not present

## 2015-09-16 DIAGNOSIS — M791 Myalgia: Secondary | ICD-10-CM

## 2015-09-16 DIAGNOSIS — F17210 Nicotine dependence, cigarettes, uncomplicated: Secondary | ICD-10-CM | POA: Diagnosis not present

## 2015-09-16 DIAGNOSIS — R937 Abnormal findings on diagnostic imaging of other parts of musculoskeletal system: Secondary | ICD-10-CM

## 2015-09-16 DIAGNOSIS — M4726 Other spondylosis with radiculopathy, lumbar region: Secondary | ICD-10-CM

## 2015-09-16 DIAGNOSIS — M7918 Myalgia, other site: Secondary | ICD-10-CM

## 2015-09-16 NOTE — Patient Instructions (Signed)
Smoking Cessation, Tips for Success If you are ready to quit smoking, congratulations! You have chosen to help yourself be healthier. Cigarettes bring nicotine, tar, carbon monoxide, and other irritants into your body. Your lungs, heart, and blood vessels will be able to work better without these poisons. There are many different ways to quit smoking. Nicotine gum, nicotine patches, a nicotine inhaler, or nicotine nasal spray can help with physical craving. Hypnosis, support groups, and medicines help break the habit of smoking. WHAT THINGS CAN I DO TO MAKE QUITTING EASIER?  Here are some tips to help you quit for good:  Pick a date when you will quit smoking completely. Tell all of your friends and family about your plan to quit on that date.  Do not try to slowly cut down on the number of cigarettes you are smoking. Pick a quit date and quit smoking completely starting on that day.  Throw away all cigarettes.   Clean and remove all ashtrays from your home, work, and car.  On a card, write down your reasons for quitting. Carry the card with you and read it when you get the urge to smoke.  Cleanse your body of nicotine. Drink enough water and fluids to keep your urine clear or pale yellow. Do this after quitting to flush the nicotine from your body.  Learn to predict your moods. Do not let a bad situation be your excuse to have a cigarette. Some situations in your life might tempt you into wanting a cigarette.  Never have "just one" cigarette. It leads to wanting another and another. Remind yourself of your decision to quit.  Change habits associated with smoking. If you smoked while driving or when feeling stressed, try other activities to replace smoking. Stand up when drinking your coffee. Brush your teeth after eating. Sit in a different chair when you read the paper. Avoid alcohol while trying to quit, and try to drink fewer caffeinated beverages. Alcohol and caffeine may urge you to  smoke.  Avoid foods and drinks that can trigger a desire to smoke, such as sugary or spicy foods and alcohol.  Ask people who smoke not to smoke around you.  Have something planned to do right after eating or having a cup of coffee. For example, plan to take a walk or exercise.  Try a relaxation exercise to calm you down and decrease your stress. Remember, you may be tense and nervous for the first 2 weeks after you quit, but this will pass.  Find new activities to keep your hands busy. Play with a pen, coin, or rubber band. Doodle or draw things on paper.  Brush your teeth right after eating. This will help cut down on the craving for the taste of tobacco after meals. You can also try mouthwash.   Use oral substitutes in place of cigarettes. Try using lemon drops, carrots, cinnamon sticks, or chewing gum. Keep them handy so they are available when you have the urge to smoke.  When you have the urge to smoke, try deep breathing.  Designate your home as a nonsmoking area.  If you are a heavy smoker, ask your health care provider about a prescription for nicotine chewing gum. It can ease your withdrawal from nicotine.  Reward yourself. Set aside the cigarette money you save and buy yourself something nice.  Look for support from others. Join a support group or smoking cessation program. Ask someone at home or at work to help you with your plan   to quit smoking.  Always ask yourself, "Do I need this cigarette or is this just a reflex?" Tell yourself, "Today, I choose not to smoke," or "I do not want to smoke." You are reminding yourself of your decision to quit.  Do not replace cigarette smoking with electronic cigarettes (commonly called e-cigarettes). The safety of e-cigarettes is unknown, and some may contain harmful chemicals.  If you relapse, do not give up! Plan ahead and think about what you will do the next time you get the urge to smoke. HOW WILL I FEEL WHEN I QUIT SMOKING? You  may have symptoms of withdrawal because your body is used to nicotine (the addictive substance in cigarettes). You may crave cigarettes, be irritable, feel very hungry, cough often, get headaches, or have difficulty concentrating. The withdrawal symptoms are only temporary. They are strongest when you first quit but will go away within 10-14 days. When withdrawal symptoms occur, stay in control. Think about your reasons for quitting. Remind yourself that these are signs that your body is healing and getting used to being without cigarettes. Remember that withdrawal symptoms are easier to treat than the major diseases that smoking can cause.  Even after the withdrawal is over, expect periodic urges to smoke. However, these cravings are generally short lived and will go away whether you smoke or not. Do not smoke! WHAT RESOURCES ARE AVAILABLE TO HELP ME QUIT SMOKING? Your health care provider can direct you to community resources or hospitals for support, which may include:  Group support.  Education.  Hypnosis.  Therapy.   This information is not intended to replace advice given to you by your health care provider. Make sure you discuss any questions you have with your health care provider.   Document Released: 07/01/2004 Document Revised: 10/24/2014 Document Reviewed: 03/21/2013 Elsevier Interactive Patient Education [employer].

## 2015-09-16 NOTE — Progress Notes (Signed)
Patient's Name: Theresa [name] MRN: [id number] DOB: 05-08-1956 DOS: 09/16/2015  Primary Reason(s) for Visit: Initial Patient Evaluation. CC: Back Pain   HPI:   Theresa [name] is a 59 y.o. year old, female patient, who comes today for an initial evaluation. She has Chronic respiratory acidosis; Hypoventilation, idiopathic; Nicotine addiction; Neuritis or radiculitis due to rupture of lumbar intervertebral disc; Decreased motor strength; Essential (primary) hypertension; Clinical depression; DDD (degenerative disc disease), lumbar; CAFL (chronic airflow limitation) ([place]); Chronic rhinitis; Carpal tunnel syndrome; Blood clotting disorder ([place]); Anxiety state; Chronic respiratory failure with hypoxia ([place]); Chronic diastolic heart failure ([place]); Leg pain; Chest pain; Tobacco abuse; Chronic pain; Long term current use of opiate analgesic; Long term prescription opiate use; Opiate use; Encounter for therapeutic drug level monitoring; Encounter for chronic pain management; Pain management; Neurogenic pain; Neuropathic pain; Musculoskeletal pain; Lumbar spondylosis (L4-5); Chronic low back pain; Chronic lower extremity pain (Bilateral); Chronic lumbar radicular pain (Bilateral); Chronic shoulder pain (Right side); Chronic upper extremity pain (Right-sided); Cervical radiculitis (Right side); Abnormal x-ray of lumbar spine; Chronic pain syndrome (significant psychosocial component); Chronic pain associated with significant psychosocial dysfunction; and 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 on her problem list.. Her primarily concern today is the Back Pain     The patient comes in today for the first time complaining primarily of low back pain (Bilateral) with most of the pain being in the center, suggesting discogenic etiology. Furthermore the patient confirms that the pain worsens with standing and walking suggesting that when pressure is applied to the disc interval trigger the pain. The  patient's second source of pain is described to be the right shoulder which with pain that goes all the way down into her fingers with tingling and numbness. This is described to be worse than the left shoulder where she describes that she used to have pain localized to the shoulder joint itself but it went away after she had some "cortisone shots" by Theresa [name]. The patient also describes a prior history of bilateral carpal tunnel syndrome. She describes having had a release on the left side at [facility]. After the surgery the pain went completely away. She remains to have the right side done. The patient's third source of pain is described to be the lower extremities (Bilateral) (right more than left). In the case of the left lower extremity the pain goes down to the calf through the back of the leg in was in what seems to be a "referred pattern". In the case of the right lower extremity the pain goes down to the calf through the back and front of the leg as well as the knee in what seems to be an L4 dermatomal pattern.  This patient describes that most of these pains have worsened over the past 4 years due to the chronic use of steroids for her COPD. Unfortunately, she indicates continuing to smoke and having started at age 71.   Today's Pain Score: 10-Worst pain ever, clinically the patient looks like a 3/10. Clear symptom exaggeration. Reported level of pain is incompatible with clinical observations. Pain Type: Chronic pain Pain Location: Back Pain Orientation: Lower Pain Descriptors / Indicators: Pounding ("when I stand up it feels like it is tight and is going to snap".. Mainly when standing or walking)  Onset and Duration: Gradual, Date of onset: 5 years and Present longer than 3 months Cause of pain: Unknown Severity: Getting worse, NAS-11 at its worse: 10/10, NAS-11 at its best: 5/10, NAS-11 now:  10/10 and NAS-11 on the average: 10/10 Timing: Not influenced by the time of the day and  During activity or exercise Aggravating Factors: Prolonged standing and Walking Alleviating Factors: Sitting Associated Problems: Swelling Quality of Pain: Deep, Disabling, Horrible, Sharp, Throbbing, Tingling and Uncomfortable Previous Examinations or Tests: MRI scan and X-rays Previous Treatments: Epidural steroid injections and Narcotic medications  Pharmacotherapy Review: The patient's medication management is currently not under our care. Side-effects or Adverse reactions: None reported. Effectiveness: Described as relatively effective, allowing for increase in activities of daily living (ADL). Onset of action: Within expected pharmacological parameters. Duration of action: Within normal limits for medication. Peak effect: Timing and results are as within normal expected parameters. [place] PMP: Compliant with practice rules and regulations. Last UDS available in the system: None available at this time. UDS: Compliant with practice rules and regulations. Lab work: No new labs ordered by our practice. Treatment compliance: Compliant. Substance Use Disorder (SUD) Risk Level: Low Planned course of action: Continue therapy as is.  Allergies: Theresa [name] is allergic to codeine.  Meds: The patient has a current medication list which includes the following prescription(s): albuterol, alprazolam, aspirin-caffeine, bisoprolol-hydrochlorothiazide, estradiol, furosemide, ipratropium-albuterol, methocarbamol, NON FORMULARY, roflumilast, sertraline, and tiotropium. Requested Prescriptions    No prescriptions requested or ordered in this encounter    ROS: Cardiovascular History: Hypertension, Congestive heart failure and Heart catheterization Pulmonary or Respiratory History: Lung problems, Emphysema, Smoker and Bronchitis Neurological History: Negative for epilepsy, stroke, urinary or fecal inontinence, spina bifida or tethered cord syndrome Psychological-Psychiatric History: Anxiety and  Depression Gastrointestinal History: Ulcers and Reflux or heatburn Genitourinary History: Nephrolithiasis and Hematuria Hematological History: Brusing easily Endocrine History: Negative for diabetes or thyroid disease Rheumatologic History: Rheumatoid arthritis Musculoskeletal History: Negative for myasthenia gravis, muscular dystrophy, multiple sclerosis or malignant hyperthermia Work History: Legally disabled since October 2012 secondary to COPD.  PFSH: Medical:  Ms. Cornacchia  has a past medical history of COPD (chronic obstructive pulmonary disease) ([place]); Hypertension; Gastritis; Lumbar radiculitis; Muscle weakness; DDD (degenerative disc disease), cervical; Depression; Chronic rhinitis; Chronic generalized abdominal pain; Anxiety; Edema; Carpal tunnel syndrome; Low back pain; Hematuria; Malodorous urine; Sensory urge incontinence; Obesity; Renal cyst; Tobacco abuse; and CHF (congestive heart failure) ([place]). Family: family history includes Coronary artery disease in her mother; Kidney cancer in her brother; Kidney disease in her mother; Lung cancer in her sister. There is no history of Prostate cancer. Surgical:  has past surgical history that includes Tubal ligation; Abdominal hysterectomy; Tonsillectomy; and Carpal tunnel release (Left). Tobacco:  reports that she has been smoking Cigarettes.  She has a 40 pack-year smoking history. She has never used smokeless tobacco. Alcohol:  reports that she does not drink alcohol. Drug:  reports that she does not use illicit drugs.  Physical Exam: Vitals:  Today's Vitals   09/16/15 1244 09/16/15 1246  BP: 108/45   Pulse: 80   Temp: 97.7 F (36.5 C)   Resp: 22   Height: _0  (1.6 m)   Weight: 265 lb (120.203 kg)   SpO2: 96%   PainSc: 10-Worst pain ever 10-Worst pain ever  PainLoc: Back   Calculated BMI: Body mass index is 46.95 kg/(m^2). General appearance: alert, cooperative, appears older than stated age, distracted, mild distress,  morbidly obese and slowed mentation Eyes: conjunctivae/corneas clear. PERRL, EOM's intact. Fundi benign. Lungs: Clear evidence of COPD with shortness breath on very mild exertion. Neck: Some decreased range of motion and evidence of accessory muscles of respiration. Back: Decreased range of motion with  pain on hyperextension and rotation highly suggestive of lumbar facet disease but with a midline component likely to be discogenic in nature. Extremities: Some possible edema of the lower extremities but difficult to evaluate due to the patient's body habitus. Pulses: Difficult to assess and palpate secondary to the patient's body habitus. Skin: Skin color, texture, turgor normal. No rashes or lesions Neurologic: Gait: Antalgic    Assessment: Encounter Diagnosis:  Primary Diagnosis: Chronic pain [G89.29]  Note: As per protocol, today's visit has been an evaluation only. We have not taken over the patient's controlled substance management.  Plan: Cordelia was seen today for back pain.  Diagnoses and all orders for this visit:  Chronic pain -     COMPLETE METABOLIC PANEL WITH GFR; Future -     C-reactive protein; Future -     Magnesium; Future -     Sedimentation rate; Future -     Vitamin D2,D3 Panel; Future  Long term current use of opiate analgesic -     Drugs of abuse screen w/o alc, rtn urine-sln -     Ambulatory referral to Psychology  Long term prescription opiate use -     Ambulatory referral to Psychology  Opiate use  Encounter for therapeutic drug level monitoring  Encounter for chronic pain management  Pain management  Neurogenic pain  Neuropathic pain  Musculoskeletal pain  Osteoarthritis of spine with radiculopathy, lumbar region  Chronic low back pain  Chronic pain of lower extremity, unspecified laterality  Chronic lumbar radicular pain (Bilateral)  Chronic shoulder pain (Right side)  Chronic upper extremity pain (Right-sided)  Cervical  radiculitis (Right side)  Abnormal x-ray of lumbar spine  Chronic pain syndrome (significant psychosocial component)  Chronic pain associated with significant psychosocial dysfunction  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     Patient Instructions  Smoking Cessation, Tips for Success If you are ready to quit smoking, congratulations! You have chosen to help yourself be healthier. Cigarettes bring nicotine, tar, carbon monoxide, and other irritants into your body. Your lungs, heart, and blood vessels will be able to work better without these poisons. There are many different ways to quit smoking. Nicotine gum, nicotine patches, a nicotine inhaler, or nicotine nasal spray can help with physical craving. Hypnosis, support groups, and medicines help break the habit of smoking. WHAT THINGS CAN I DO TO MAKE QUITTING EASIER?  Here are some tips to help you quit for good:  Pick a date when you will quit smoking completely. Tell all of your friends and family about your plan to quit on that date.  Do not try to slowly cut down on the number of cigarettes you are smoking. Pick a quit date and quit smoking completely starting on that day.  Throw away all cigarettes.   Clean and remove all ashtrays from your home, work, and car.  On a card, write down your reasons for quitting. Carry the card with you and read it when you get the urge to smoke.  Cleanse your body of nicotine. Drink enough water and fluids to keep your urine clear or pale yellow. Do this after quitting to flush the nicotine from your body.  Learn to predict your moods. Do not let a bad situation be your excuse to have a cigarette. Some situations in your life might tempt you into wanting a cigarette.  Never have "just one" cigarette. It leads to wanting another and another. Remind yourself of your decision to quit.  Change habits  associated with smoking. If you smoked while driving or when feeling  stressed, try other activities to replace smoking. Stand up when drinking your coffee. Brush your teeth after eating. Sit in a different chair when you read the paper. Avoid alcohol while trying to quit, and try to drink fewer caffeinated beverages. Alcohol and caffeine may urge you to smoke.  Avoid foods and drinks that can trigger a desire to smoke, such as sugary or spicy foods and alcohol.  Ask people who smoke not to smoke around you.  Have something planned to do right after eating or having a cup of coffee. For example, plan to take a walk or exercise.  Try a relaxation exercise to calm you down and decrease your stress. Remember, you may be tense and nervous for the first 2 weeks after you quit, but this will pass.  Find new activities to keep your hands busy. Play with a pen, coin, or rubber band. Doodle or draw things on paper.  Brush your teeth right after eating. This will help cut down on the craving for the taste of tobacco after meals. You can also try mouthwash.   Use oral substitutes in place of cigarettes. Try using lemon drops, carrots, cinnamon sticks, or chewing gum. Keep them handy so they are available when you have the urge to smoke.  When you have the urge to smoke, try deep breathing.  Designate your home as a nonsmoking area.  If you are a heavy smoker, ask your health care provider about a prescription for nicotine chewing gum. It can ease your withdrawal from nicotine.  Reward yourself. Set aside the cigarette money you save and buy yourself something nice.  Look for support from others. Join a support group or smoking cessation program. Ask someone at home or at work to help you with your plan to quit smoking.  Always ask yourself, "Do I need this cigarette or is this just a reflex?" Tell yourself, "Today, I choose not to smoke," or "I do not want to smoke." You are reminding yourself of your decision to quit.  Do not replace cigarette smoking with  electronic cigarettes (commonly called e-cigarettes). The safety of e-cigarettes is unknown, and some may contain harmful chemicals.  If you relapse, do not give up! Plan ahead and think about what you will do the next time you get the urge to smoke. HOW WILL I FEEL WHEN I QUIT SMOKING? You may have symptoms of withdrawal because your body is used to nicotine (the addictive substance in cigarettes). You may crave cigarettes, be irritable, feel very hungry, cough often, get headaches, or have difficulty concentrating. The withdrawal symptoms are only temporary. They are strongest when you first quit but will go away within 10-14 days. When withdrawal symptoms occur, stay in control. Think about your reasons for quitting. Remind yourself that these are signs that your body is healing and getting used to being without cigarettes. Remember that withdrawal symptoms are easier to treat than the major diseases that smoking can cause.  Even after the withdrawal is over, expect periodic urges to smoke. However, these cravings are generally short lived and will go away whether you smoke or not. Do not smoke! WHAT RESOURCES ARE AVAILABLE TO HELP ME QUIT SMOKING? Your health care provider can direct you to community resources or hospitals for support, which may include:  Group support.  Education.  Hypnosis.  Therapy.   This information is not intended to replace advice given to you by  your health care provider. Make sure you discuss any questions you have with your health care provider.   Document Released: 07/01/2004 Document Revised: 10/24/2014 Document Reviewed: 03/21/2013 Elsevier Interactive Patient Education 2016 [employer].    Medications discontinued today:  Medications Discontinued During This Encounter  Medication Reason  . budesonide-formoterol (SYMBICORT) 160-4.5 MCG/ACT inhaler Error  . hydrocodone-ibuprofen (VICOPROFEN) 5-200 MG tablet Error  . nicotine (NICODERM CQ - DOSED IN  MG/24 HOURS) 21 mg/24hr patch Error   Medications administered today:  Ms. Cavey had no medications administered during this visit.  Primary Care Physician: [name], PA Location: [facility] Note by: Kathlen Brunswick. Dossie Arbour, M.D, DABA, DABAPM, DABPM, DABIPP, FIPP

## 2015-09-16 NOTE — Progress Notes (Signed)
Safety precautions to be maintained throughout the outpatient stay will include: orient to surroundings, keep bed in low position, maintain call bell within reach at all times, provide assistance with transfer out of bed and ambulation.

## 2015-09-17 ENCOUNTER — Other Ambulatory Visit
Admission: RE | Admit: 2015-09-17 | Discharge: 2015-09-17 | Disposition: A | Discharge status: Home / Self Care | Payer: Medicare PPO | Source: Ambulatory Visit | Attending: Pain Medicine | Admitting: Pain Medicine

## 2015-09-17 DIAGNOSIS — G8929 Other chronic pain: Secondary | ICD-10-CM | POA: Diagnosis present

## 2015-09-17 LAB — SEDIMENTATION RATE: Sed Rate: 5 mm/hr (ref 0–30)

## 2015-09-17 LAB — MAGNESIUM: Magnesium: 2.1 mg/dL (ref 1.7–2.4)

## 2015-09-17 LAB — C-REACTIVE PROTEIN: CRP: 1.1 mg/dL — ABNORMAL HIGH (ref <1.0)

## 2015-09-20 LAB — 25-HYDROXY VITAMIN D LCMS D2+D3
25-Hydroxy, Vitamin D-2: <1 ng/mL
25-Hydroxy, Vitamin D-3: 13 ng/mL
25-Hydroxy, Vitamin D: 13 ng/mL — ABNORMAL LOW

## 2015-09-21 ENCOUNTER — Other Ambulatory Visit: Payer: Self-pay | Admitting: Family Medicine

## 2015-09-21 DIAGNOSIS — Z1231 Encounter for screening mammogram for malignant neoplasm of breast: Secondary | ICD-10-CM

## 2015-09-21 DIAGNOSIS — F4542 Pain disorder with related psychological factors: Secondary | ICD-10-CM | POA: Insufficient documentation

## 2015-09-21 DIAGNOSIS — G894 Chronic pain syndrome: Secondary | ICD-10-CM | POA: Insufficient documentation

## 2015-09-22 NOTE — Progress Notes (Signed)
Quick Note:  Normal levels of Vitamin D for our Lab are between 30 and 100 ng/mL. The results of this test indicate that this patient has low levels of Vitamin D, compatible with a deficiency (<20 ng/ml), and/or insufficiency (20-30 ng/ml). Common causes include: dietary insufficiency; inadequate sun exposure; inability to absorb vitamin D from the intestines; or inability to process it due to kidney or liver disease. Associated complications may include hypocalcemia, hypophosphatemia, and reduced bone density. In addition, it is associated with fatigue, weakness, bone pain, joint pain, and muscle pain. The patient may benefit from taking over-the-counter Vitamin D3 supplements. I recommend 2,000 IU once a day, preferably with a Calcium supplement. ______

## 2015-09-24 ENCOUNTER — Ambulatory Visit: Payer: Medicare PPO | Attending: Family Medicine

## 2015-09-24 LAB — TOXASSURE SELECT 13 (MW), URINE: PDF: 0

## 2015-09-28 ENCOUNTER — Encounter: Payer: Self-pay | Admitting: Urology

## 2015-09-28 ENCOUNTER — Ambulatory Visit: Payer: Medicare PPO | Admitting: Urology

## 2015-10-06 ENCOUNTER — Encounter: Payer: Self-pay | Admitting: Pain Medicine

## 2015-10-06 ENCOUNTER — Other Ambulatory Visit: Payer: Self-pay

## 2015-10-08 ENCOUNTER — Encounter: Payer: Self-pay | Admitting: Physician Assistant

## 2015-10-08 ENCOUNTER — Ambulatory Visit (INDEPENDENT_AMBULATORY_CARE_PROVIDER_SITE_OTHER): Payer: Medicare PPO | Admitting: Physician Assistant

## 2015-10-08 ENCOUNTER — Ambulatory Visit: Payer: Medicare PPO | Admitting: Family Medicine

## 2015-10-08 VITALS — BP 90/60 | HR 87 | Temp 98.0°F | Resp 19 | Wt 266.4 lb

## 2015-10-08 DIAGNOSIS — N3941 Urge incontinence: Secondary | ICD-10-CM

## 2015-10-08 DIAGNOSIS — R300 Dysuria: Secondary | ICD-10-CM

## 2015-10-08 DIAGNOSIS — N390 Urinary tract infection, site not specified: Secondary | ICD-10-CM

## 2015-10-08 DIAGNOSIS — R059 Cough, unspecified: Secondary | ICD-10-CM

## 2015-10-08 DIAGNOSIS — R05 Cough: Secondary | ICD-10-CM

## 2015-10-08 DIAGNOSIS — J441 Chronic obstructive pulmonary disease with (acute) exacerbation: Secondary | ICD-10-CM | POA: Diagnosis not present

## 2015-10-08 DIAGNOSIS — J0100 Acute maxillary sinusitis, unspecified: Secondary | ICD-10-CM

## 2015-10-08 LAB — POCT URINALYSIS DIPSTICK
Bilirubin, UA: NEGATIVE
Glucose, UA: NEGATIVE
Ketones, UA: NEGATIVE
Leukocytes, UA: NEGATIVE
Nitrite, UA: NEGATIVE
Protein, UA: NEGATIVE
Spec Grav, UA: 1.01
Urobilinogen, UA: 0.2
pH, UA: 6.5

## 2015-10-08 MED ORDER — SULFAMETHOXAZOLE-TRIMETHOPRIM 800-160 MG PO TABS: 1.0000 | ORAL_TABLET | Freq: Two times a day (BID) | ORAL | Status: DC

## 2015-10-08 MED ORDER — HYDROCOD POLST-CPM POLST ER 10-8 MG/5ML PO SUER: 5.0000 mL | Freq: Two times a day (BID) | ORAL | Status: DC | PRN

## 2015-10-08 NOTE — Progress Notes (Signed)
Patient: Theresa [name] Female    DOB: [birth year]   59 y.o.   MRN: [id number] Visit Date: 10/08/2015  Today's Provider: Mar Daring, PA-C   Chief Complaint  Patient presents with  . Follow-up    COPD   Subjective:    URI  This is a new problem. The current episode started in the past 7 days. The problem has been gradually worsening (The cough and breathing is getting worst.). There has been no fever. Associated symptoms include congestion, coughing, dysuria, headaches, rhinorrhea, sinus pain, sneezing, a sore throat and wheezing. Pertinent negatives include no abdominal pain, chest pain, nausea, neck pain or vomiting. Associated symptoms comments: Patient has Chronic Respiratory Failure with Hypoxia.Marland Kitchen She has tried inhaler use and increased fluids (Breathing treatments to help her with the URI symptoms.) for the symptoms.  Urinary Tract Infection  This is a new problem. The current episode started in the past 7 days. The problem occurs intermittently. The problem has been gradually worsening. The quality of the pain is described as burning. The pain is at a severity of 10/10. There has been no fever. Associated symptoms include chills (last night), flank pain and urgency. Pertinent negatives include no hematuria, nausea or vomiting. She has tried nothing for the symptoms.        Allergies  Allergen Reactions  . Codeine Nausea And Vomiting   Previous Medications   ADVAIR DISKUS 250-50 MCG/DOSE AEPB       ALBUTEROL (PROVENTIL HFA;VENTOLIN HFA) 108 (90 BASE) MCG/ACT INHALER    Inhale 2 puffs into the lungs every 6 (six) hours as needed for wheezing.   ALPRAZOLAM (XANAX) 0.5 MG TABLET    Take 1 tablet (0.5 mg total) by mouth at bedtime as needed for anxiety.   ASPIRIN-CAFFEINE (BC FAST PAIN RELIEF ARTHRITIS) 1000-65 MG PACK    Take 1 packet by mouth every 6 (six) hours as needed (Pain).   BISOPROLOL-HYDROCHLOROTHIAZIDE (ZIAC) 10-6.25 MG PER TABLET    TAKE 1 TABLET BY MOUTH  DAILY   ESTRADIOL (ESTRACE) 1 MG TABLET    Take 1 mg by mouth daily.   FUROSEMIDE (LASIX) 20 MG TABLET    Take 1 tablet (20 mg total) by mouth daily.   IPRATROPIUM-ALBUTEROL (DUONEB) 0.5-2.5 (3) MG/3ML SOLN    Take 3 mLs by nebulization every 6 (six) hours.   METHOCARBAMOL (ROBAXIN) 750 MG TABLET    Take 750 mg by mouth 4 (four) times daily.   NICOTINE (NICODERM CQ - DOSED IN MG/24 HOURS) 21 MG/24HR PATCH       NON FORMULARY    Apply 1 application topically 4 (four) times daily as needed (joint/ muscle pain). Perform Cool & Soothing Pain Relieving Roll-On (Menthol 3.5% topical)   ROFLUMILAST (DALIRESP) 500 MCG TABS TABLET    Take 500 mcg by mouth daily.   SERTRALINE (ZOLOFT) 50 MG TABLET    Take 1 tablet (50 mg total) by mouth daily.   TIOTROPIUM (SPIRIVA) 18 MCG INHALATION CAPSULE    Place 1 capsule (18 mcg total) into inhaler and inhale daily.    Review of Systems  Constitutional: Positive for chills (last night) and fatigue. Negative for fever.  HENT: Positive for congestion, postnasal drip, rhinorrhea, sneezing, sore throat and voice change.   Respiratory: Positive for cough, chest tightness, shortness of breath and wheezing.   Cardiovascular: Negative for chest pain, palpitations and leg swelling.  Gastrointestinal: Negative for nausea, vomiting and abdominal pain.  Genitourinary: Positive for dysuria, urgency,  flank pain and enuresis. Negative for hematuria, vaginal discharge and pelvic pain.  Musculoskeletal: Positive for back pain (Has back pain all the time, but this back pain is different from the usual). Negative for neck pain and neck stiffness.  Neurological: Positive for headaches.  All other systems reviewed and are negative.   Social History  Substance Use Topics  . Smoking status: Current Every Day Smoker -- 1.00 packs/day for 40 years    Types: Cigarettes  . Smokeless tobacco: Never Used     Comment: 30+ years, pt states she has cut back to 4-5 cigarettes a day  .  Alcohol Use: No   Objective:   There were no vitals taken for this visit.  Physical Exam  Constitutional: She appears well-developed and well-nourished. No distress.  HENT:  Head: Normocephalic and atraumatic.  Right Ear: Hearing, tympanic membrane, external ear and ear canal normal. No middle ear effusion.  Left Ear: Hearing, tympanic membrane, external ear and ear canal normal.  No middle ear effusion.  Nose: Mucosal edema and rhinorrhea present. Right sinus exhibits maxillary sinus tenderness. Right sinus exhibits no frontal sinus tenderness. Left sinus exhibits maxillary sinus tenderness. Left sinus exhibits no frontal sinus tenderness.  Mouth/Throat: Uvula is midline and mucous membranes are normal. Posterior oropharyngeal erythema present. No oropharyngeal exudate or posterior oropharyngeal edema.  Neck: Normal range of motion. Neck supple. No tracheal deviation present. No thyromegaly present.  Cardiovascular: Normal rate, regular rhythm and normal heart sounds.  Exam reveals no gallop and no friction rub.   No murmur heard. Pulmonary/Chest: Effort normal. No stridor. No respiratory distress. She has wheezes (expiratory wheezes heard throughout). She has no rales.  Lymphadenopathy:    She has no cervical adenopathy.  Skin: She is not diaphoretic.  Vitals reviewed.       Assessment & Plan:     1. Urinary tract infection without hematuria, site unspecified  UA was not indicative of UTI but she is having similar symptoms to previous UTIs. She states she also has had one kidney stone in her history but this does not feel similar to the kidney stone she had. I will send the urine for culture for further evaluation. If urine culture comes back positive I will adjust antibiotic therapy as necessary per results of cultures and sensitivities. If culture comes back negative we will change antibiotic to an antibiotic that is more predominant with treating lung infections. I will call her with  the results of the urine culture once I receive them. She is to continue the Bactrim as below until she hears back from me. She voiced understanding. She is to call the office if symptoms fail to improve or worsen in the meantime. - POCT urinalysis dipstick - sulfamethoxazole-trimethoprim (BACTRIM DS,SEPTRA DS) 800-160 MG tablet; Take 1 tablet by mouth 2 (two) times daily.  Dispense: 20 tablet; Refill: 0  2. Urgency incontinence See above medical treatment plan. - Urine Culture  3. Dysuria See above medical treatment plan. - Urine Culture  4. Acute maxillary sinusitis, recurrence not specified  new onset of acute maxillary sinusitis. She states that the increased congestion , rhinorrhea, sneezing, cough him a sore throat all started approximately one week ago. She has not had any improvement in symptoms. I will treat with Bactrim as stated above. If her Urine culture results come back negative we will adjust antibiotic therapy to something that is more potent in the respiratory tract. She agrees with this treatment plan. She is to call the  office if symptoms worsen in the meantime.  5. Chronic obstructive pulmonary disease with acute exacerbation (HCC) Worsening. I will treat as below with Bactrim. She is to continue all of her respiratory medications including using her nebulizer for the time being. If her urine culture results come back negative we will adjust antibiotic therapy to something that is more patent in the respiratory tracts for her. She states that sulfa antibiotics do not normally works well for her for her upper respiratory symptoms however they have worked well for her for her urinary tract symptoms. She does agree with the current treatment plan and will take the Bactrim as prescribed. She is to call the office if symptoms worsen in the meantime. - sulfamethoxazole-trimethoprim (BACTRIM DS,SEPTRA DS) 800-160 MG tablet; Take 1 tablet by mouth 2 (two) times daily.  Dispense: 20  tablet; Refill: 0  6. Cough Worsening nighttime cough when she lies flat. Will give Tussionex as below for nighttime cough. She is to call the office if symptoms fail to improve or worsen. - chlorpheniramine-HYDROcodone (TUSSIONEX PENNKINETIC ER) 10-8 MG/5ML SUER; Take 5 mLs by mouth every 12 (twelve) hours as needed for cough.  Dispense: 240 mL; Refill: 0       Mar Daring, PA-C  [facility]

## 2015-10-08 NOTE — Patient Instructions (Signed)

## 2015-10-09 ENCOUNTER — Telehealth: Payer: Self-pay

## 2015-10-09 LAB — URINE CULTURE: Organism ID, Bacteria: NO GROWTH

## 2015-10-09 LAB — PLEASE NOTE

## 2015-10-09 NOTE — Telephone Encounter (Signed)
Can you do me a huge favor and call her pharmacy to see if any cough medications are covered and the cost?  I have tried to pull a few different ones and none seem to be covered for her per epic.  Once we know the prices she will decide which is best.  The tussionex was going to cost her $170.

## 2015-10-09 NOTE — Telephone Encounter (Signed)
Patient advised as directed below.  Thanks,  -Joseline

## 2015-10-09 NOTE — Telephone Encounter (Signed)
Can we please notify her that there are no prescription cough medications covered.  They will all most likely be over $100 without coverage.  It would be best for her to try OTC cough medications such as delsym, robotussin, or mucinex.

## 2015-10-09 NOTE — Telephone Encounter (Signed)
Spoke with one of the pharmacist and stated that her Insurance doesn't cover any cough medicine.  Thanks,  -Joseline

## 2015-10-09 NOTE — Telephone Encounter (Signed)
Patient reports that she can not afford the cough medication that was sent into the pharmacy (Tussionex). Patient was wanting to know if there was anything cheaper that would help? She reports that her insurance would not cover medication. Please advise. Contact number is [phone]. Thanks!

## 2015-10-13 ENCOUNTER — Telehealth: Payer: Self-pay

## 2015-10-13 NOTE — Telephone Encounter (Signed)
LMTCB  Thanks,  -Katrisha Segall

## 2015-10-13 NOTE — Telephone Encounter (Signed)
-----  Message from Mar Daring, PA-C sent at 10/13/2015  8:26 AM EST ----- No bacterial growth on urine.  See if she needs to switch antibiotics for her sinusitis.  Thanks.

## 2015-10-15 NOTE — Telephone Encounter (Signed)
Pt advised, pt states she is doing better and does not need to switch medications at this time-aa

## 2015-10-22 ENCOUNTER — Encounter: Payer: Self-pay | Admitting: Family Medicine

## 2015-10-22 ENCOUNTER — Ambulatory Visit
Admission: RE | Admit: 2015-10-22 | Discharge: 2015-10-22 | Disposition: A | Discharge status: Home / Self Care | Payer: Medicare PPO | Source: Ambulatory Visit | Attending: Family Medicine | Admitting: Family Medicine

## 2015-10-22 ENCOUNTER — Ambulatory Visit (INDEPENDENT_AMBULATORY_CARE_PROVIDER_SITE_OTHER): Payer: Medicare PPO | Admitting: Family Medicine

## 2015-10-22 ENCOUNTER — Other Ambulatory Visit: Payer: Self-pay | Admitting: Physician Assistant

## 2015-10-22 VITALS — BP 118/74 | HR 95 | Temp 99.0°F | Resp 18 | Wt 263.4 lb

## 2015-10-22 DIAGNOSIS — R05 Cough: Secondary | ICD-10-CM | POA: Diagnosis not present

## 2015-10-22 DIAGNOSIS — J441 Chronic obstructive pulmonary disease with (acute) exacerbation: Secondary | ICD-10-CM | POA: Diagnosis not present

## 2015-10-22 DIAGNOSIS — D689 Coagulation defect, unspecified: Secondary | ICD-10-CM | POA: Insufficient documentation

## 2015-10-22 DIAGNOSIS — J9611 Chronic respiratory failure with hypoxia: Secondary | ICD-10-CM

## 2015-10-22 DIAGNOSIS — F411 Generalized anxiety disorder: Secondary | ICD-10-CM | POA: Diagnosis not present

## 2015-10-22 DIAGNOSIS — H109 Unspecified conjunctivitis: Secondary | ICD-10-CM

## 2015-10-22 MED ORDER — CIPROFLOXACIN HCL 0.3 % OP SOLN: 2.0000 [drp] | OPHTHALMIC | Status: DC

## 2015-10-22 MED ORDER — LEVOFLOXACIN 500 MG PO TABS: 500.0000 mg | ORAL_TABLET | Freq: Every day | ORAL | Status: DC

## 2015-10-22 MED ORDER — SERTRALINE HCL 100 MG PO TABS: 50.0000 mg | ORAL_TABLET | Freq: Every day | ORAL | Status: DC

## 2015-10-22 MED ORDER — PREDNISONE 10 MG PO TABS: 10.0000 mg | ORAL_TABLET | Freq: Every day | ORAL | Status: DC

## 2015-10-22 NOTE — Progress Notes (Signed)
Patient ID: Theresa [name], female   DOB: August 31, 1956, 60 y.o.   MRN: [id number]   Patient: Theresa [name] Female    DOB: 08-27-1956   60 y.o.   MRN: [id number] Visit Date: 10/22/2015  Today's Provider: [name], PA   Chief Complaint  Patient presents with  . URI   Subjective:    URI  This is a new problem. The current episode started in the past 7 days. The problem has been gradually worsening. Maximum temperature: low grade at home "99's" Associated symptoms include chest pain, congestion, coughing and sneezing. Associated symptoms comments: Hoarseness,chills, SOB, green sputum. She has tried acetaminophen (Delsym for cough and finished the Bactrim given on 10-08-15) for the symptoms.   Patient Active Problem List   Diagnosis Date Noted  . 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 Chronic pain syndrome (significant psychosocial component) 09/21/2015  . Chronic pain associated with significant psychosocial dysfunction 09/21/2015  . 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 09/21/2015  . Chronic pain 09/16/2015  . Long term current use of opiate analgesic 09/16/2015  . Long term prescription opiate use 09/16/2015  . Opiate use 09/16/2015  . Encounter for therapeutic drug level monitoring 09/16/2015  . Encounter for chronic pain management 09/16/2015  . Pain management 09/16/2015  . Neurogenic pain 09/16/2015  . Neuropathic pain 09/16/2015  . Musculoskeletal pain 09/16/2015  . Lumbar spondylosis (L4-5) 09/16/2015  . Chronic low back pain 09/16/2015  . Chronic lower extremity pain (Bilateral) 09/16/2015  . Chronic lumbar radicular pain (Bilateral) 09/16/2015  . Chronic shoulder pain (Right side) 09/16/2015  . Chronic upper extremity pain (Right-sided) 09/16/2015  . Cervical radiculitis (Right side) 09/16/2015  . Abnormal x-ray of lumbar spine 09/16/2015  . Chronic diastolic heart failure ([place]) 07/15/2015  . Leg pain 07/15/2015  . Chest pain 07/15/2015  .  Tobacco abuse 07/15/2015  . Chronic respiratory failure with hypoxia ([place]) 04/28/2015  . Blood clotting disorder ([place]) 04/21/2015  . Chronic respiratory acidosis 04/18/2015  . Hypoventilation, idiopathic 07/16/2014  . Decreased motor strength 07/16/2014  . Clinical depression 07/16/2014  . Chronic rhinitis 07/16/2014  . Carpal tunnel syndrome 07/16/2014  . Anxiety state 07/16/2014  . Neuritis or radiculitis due to rupture of lumbar intervertebral disc 07/02/2014  . DDD (degenerative disc disease), lumbar 07/02/2014  . Nicotine addiction 02/27/2014  . CAFL (chronic airflow limitation) ([place]) 02/27/2014  . Chronic obstructive pulmonary disease ([place]) 02/27/2014  . Essential (primary) hypertension 03/27/2008   Past Surgical History  Procedure Laterality Date  . Tubal ligation    . Abdominal hysterectomy    . Tonsillectomy    . Carpal tunnel release Left    Family History  Problem Relation Age of Onset  . Kidney disease Mother   . Prostate cancer Neg Hx   . Kidney cancer Brother   . Lung cancer Sister   . Coronary artery disease Mother    Allergies  Allergen Reactions  . Codeine Nausea And Vomiting     Previous Medications   ADVAIR DISKUS 250-50 MCG/DOSE AEPB       ALBUTEROL (PROVENTIL HFA;VENTOLIN HFA) 108 (90 BASE) MCG/ACT INHALER    Inhale 2 puffs into the lungs every 6 (six) hours as needed for wheezing.   ALPRAZOLAM (XANAX) 0.5 MG TABLET    Take 1 tablet (0.5 mg total) by mouth at bedtime as needed for anxiety.   ASPIRIN-CAFFEINE (BC FAST PAIN RELIEF ARTHRITIS) 1000-65 MG PACK    Take 1 packet by mouth every 6 (six) hours as  needed (Pain).   BISOPROLOL-HYDROCHLOROTHIAZIDE (ZIAC) 10-6.25 MG PER TABLET    TAKE 1 TABLET BY MOUTH DAILY   ESTRADIOL (ESTRACE) 1 MG TABLET    Take 1 mg by mouth daily. Reported on 10/22/2015   FUROSEMIDE (LASIX) 20 MG TABLET    Take 1 tablet (20 mg total) by mouth daily.   IPRATROPIUM-ALBUTEROL (DUONEB) 0.5-2.5 (3) MG/3ML SOLN    Take 3 mLs by  nebulization every 6 (six) hours.   NON FORMULARY    Apply 1 application topically 4 (four) times daily as needed (joint/ muscle pain). Perform Cool & Soothing Pain Relieving Roll-On (Menthol 3.5% topical)   OXYGEN       ROFLUMILAST (DALIRESP) 500 MCG TABS TABLET    Take 500 mcg by mouth daily.   SERTRALINE (ZOLOFT) 50 MG TABLET    Take 1 tablet (50 mg total) by mouth daily.   TIOTROPIUM (SPIRIVA) 18 MCG INHALATION CAPSULE    Place 1 capsule (18 mcg total) into inhaler and inhale daily.    Review of Systems  Constitutional: Negative.   HENT: Positive for congestion and sneezing.   Eyes: Negative.   Respiratory: Positive for cough and shortness of breath.   Cardiovascular: Positive for chest pain.  Gastrointestinal: Negative.   Endocrine: Negative.   Genitourinary: Negative.   Musculoskeletal: Negative.   Skin: Negative.   Allergic/Immunologic: Negative.   Neurological: Negative.   Hematological: Negative.   Psychiatric/Behavioral: Negative.     Social History  Substance Use Topics  . Smoking status: Current Every Day Smoker -- 1.00 packs/day for 40 years    Types: Cigarettes  . Smokeless tobacco: Never Used     Comment: 30+ years, pt states she has cut back to 4-5 cigarettes a day  . Alcohol Use: No   Objective:   BP 118/74 mmHg  Pulse 95  Temp(Src) 99 F (37.2 C) (Oral)  Resp 18  Wt 263 lb 6.4 oz (119.477 kg)  SpO2 95% Wt Readings from Last 3 Encounters:  10/22/15 263 lb 6.4 oz (119.477 kg)  10/08/15 266 lb 6.4 oz (120.838 kg)  09/16/15 265 lb (120.203 kg)    Physical Exam  Constitutional: She is oriented to person, place, and time. She appears well-developed and well-nourished.  HENT:  Head: Normocephalic.  Right Ear: External ear normal.  Left Ear: External ear normal.  Eyes: EOM are normal. Right eye exhibits discharge. Left eye exhibits no discharge.  Left palpebral and bulbar conjunctivitis with some mucus drainage.  Neck: Normal range of motion. Neck  supple.  Cardiovascular: Normal rate and regular rhythm.   Pulmonary/Chest:  Rhonchi and distant breath sounds throughout.  Abdominal: Soft. Bowel sounds are normal.  Neurological: She is alert and oriented to person, place, and time.  Psychiatric: She has a normal mood and affect. Her behavior is normal.      Assessment & Plan:     1. COPD with acute exacerbation ([place]) Has had recurrence of cough, purulent sputum and hoarseness with chills over the past 5 days. Will check CBC and CXR to rule out pneumonia. Continues to take Advair, albuterol prn and Duoneb by nebulizer at home. May continue Delsym or Mucinex-DM prn cough. Will start Levaquin and prednisone taper. Encouraged to drink extra fluids and recheck pending reports. - CBC with Differential/Platelet - DG Chest 2 View - levofloxacin (LEVAQUIN) 500 MG tablet; Take 1 tablet (500 mg total) by mouth daily.  Dispense: 7 tablet; Refill: 0 - predniSONE (DELTASONE) 10 MG tablet; Take 1 tablet (10  mg total) by mouth daily with breakfast. Taper over the next 6 days (6,5,4,3,2,1)  Dispense: 21 tablet; Refill: 0  2. Chronic respiratory failure with hypoxia (HCC) Still very short of breath with exertion and uses Oxygen at 2-2.5 LPM 24 hours a day for severe COPD with chronic respiratory failure. Should continue follow up with pulmonologist regularly and Spiriva with Advair, Albuterol prn rescue and Duoneb prn by nebulizer at home.  3. Anxiety state Anxiety level up a little. Doesn't use the Xanax often. Concerned about tranquilizing agents and respiratory failure. Will refill Zoloft and may increase to 100 mg at bedtime if needed occasionally. - sertraline (ZOLOFT) 100 MG tablet; Take 0.5 tablets (50 mg total) by mouth daily.  Dispense: 30 tablet; Refill: 3

## 2015-10-23 LAB — CBC WITH DIFFERENTIAL/PLATELET
Basophils Absolute: 0 10*3/uL (ref 0.0–0.2)
Basos: 0 %
EOS (ABSOLUTE): 0.1 10*3/uL (ref 0.0–0.4)
Eos: 1 %
Hematocrit: 43.2 % (ref 34.0–46.6)
Hemoglobin: 14.6 g/dL (ref 11.1–15.9)
Immature Grans (Abs): 0.1 10*3/uL (ref 0.0–0.1)
Immature Granulocytes: 1 %
Lymphocytes Absolute: 1.4 10*3/uL (ref 0.7–3.1)
Lymphs: 10 %
MCH: 33 pg (ref 26.6–33.0)
MCHC: 33.8 g/dL (ref 31.5–35.7)
MCV: 98 fL — ABNORMAL HIGH (ref 79–97)
Monocytes Absolute: 1.2 10*3/uL — ABNORMAL HIGH (ref 0.1–0.9)
Monocytes: 8 %
Neutrophils Absolute: 11.7 10*3/uL — ABNORMAL HIGH (ref 1.4–7.0)
Neutrophils: 80 %
Platelets: 186 10*3/uL (ref 150–379)
RBC: 4.42 x10E6/uL (ref 3.77–5.28)
RDW: 13.2 % (ref 12.3–15.4)
WBC: 14.4 10*3/uL — ABNORMAL HIGH (ref 3.4–10.8)

## 2015-10-26 ENCOUNTER — Ambulatory Visit: Payer: Medicare PPO | Admitting: Family

## 2015-10-27 ENCOUNTER — Telehealth: Payer: Self-pay

## 2015-10-27 NOTE — Telephone Encounter (Signed)
Left message to call back.

## 2015-10-27 NOTE — Telephone Encounter (Signed)
Advised patient as below. Patient reports that she has appt scheduled for next week and will have you recheck to see if she needs to see pulmonologist.

## 2015-10-27 NOTE — Telephone Encounter (Signed)
-----  Message from Margo Common, [place] sent at 10/23/2015  6:17 PM EST ----- CBC shows high WBC count but a little better than 3 months ago. Chest x-ray did not show any pneumonia. Should take all the new antibiotic and finish the prednisone. Recheck with pulmonologist if no better by the end of the antibiotic prescription.

## 2015-10-28 ENCOUNTER — Other Ambulatory Visit: Payer: Self-pay

## 2015-10-29 ENCOUNTER — Ambulatory Visit: Payer: Medicare PPO | Admitting: Family Medicine

## 2015-10-30 ENCOUNTER — Encounter: Payer: Self-pay | Admitting: Family Medicine

## 2015-10-30 ENCOUNTER — Ambulatory Visit (INDEPENDENT_AMBULATORY_CARE_PROVIDER_SITE_OTHER): Payer: Medicare PPO | Admitting: Family Medicine

## 2015-10-30 ENCOUNTER — Telehealth: Payer: Self-pay | Admitting: Family Medicine

## 2015-10-30 VITALS — BP 116/72 | HR 80 | Temp 98.6°F | Resp 18 | Wt 263.4 lb

## 2015-10-30 DIAGNOSIS — R5382 Chronic fatigue, unspecified: Secondary | ICD-10-CM | POA: Diagnosis not present

## 2015-10-30 DIAGNOSIS — J029 Acute pharyngitis, unspecified: Secondary | ICD-10-CM

## 2015-10-30 DIAGNOSIS — J441 Chronic obstructive pulmonary disease with (acute) exacerbation: Secondary | ICD-10-CM

## 2015-10-30 DIAGNOSIS — C44311 Basal cell carcinoma of skin of nose: Secondary | ICD-10-CM | POA: Diagnosis not present

## 2015-10-30 LAB — POCT RAPID STREP A (OFFICE): Rapid Strep A Screen: NEGATIVE

## 2015-10-30 MED ORDER — FIRST-DUKES MOUTHWASH MT SUSP: OROMUCOSAL | Status: DC

## 2015-10-30 NOTE — Telephone Encounter (Signed)
Discussed with pharmacist and found the generic compounded [facility] is not covered by insurance either. The Magic Mouthwash with Lidocaine may be $22.74 and he will call her to see if this is cost prohibitive for her.

## 2015-10-30 NOTE — Telephone Encounter (Signed)
Please advise.

## 2015-10-30 NOTE — Progress Notes (Signed)
Patient ID: Theresa [name], female   DOB: [birth year], 60 y.o.   MRN: [id number]   Patient: Theresa [name] Female    DOB: 05/28/1956   60 y.o.   MRN: [id number] Visit Date: 10/30/2015  Today's Provider: [name], PA   Chief Complaint  Patient presents with  . COPD  . Follow-up  . Sore Throat   Subjective:    Sore Throat  This is a new problem. The current episode started in the past 7 days. Associated symptoms include coughing and shortness of breath. Associated symptoms comments: Fatigue, and body aches. Exposure to: influenza.   COPD Follow Up: Last OV 10/22/2015. Symptoms stable. Finished the Levaquin 500 mg qd and continues to be very sleepy. Family notices fatigue similar to episode of "carbonmonoxide poisoning". Has had some exposure to a friend that had influenza. This patient has had a flu shot in the fall of 2016. No further fever documented. Still short of breath and getting relief with inhalers and nebulizer treatments at home.   Past Medical History  Diagnosis Date  . COPD (chronic obstructive pulmonary disease) ([facility])   . Hypertension   . Gastritis   . Lumbar radiculitis   . Muscle weakness   . DDD (degenerative disc disease), cervical   . Depression   . Chronic rhinitis   . Chronic generalized abdominal pain   . Anxiety   . Edema   . Carpal tunnel syndrome   . Low back pain   . Hematuria   . Malodorous urine   . Sensory urge incontinence   . Obesity   . Renal cyst   . Tobacco abuse   . CHF (congestive heart failure) [facility])    Past Surgical History  Procedure Laterality Date  . Tubal ligation    . Abdominal hysterectomy    . Tonsillectomy    . Carpal tunnel release Left    Family History  Problem Relation Age of Onset  . Kidney disease Mother   . Prostate cancer Neg Hx   . Kidney cancer Brother   . Lung cancer Sister   . Coronary artery disease Mother    Allergies  Allergen Reactions  . Codeine Nausea And Vomiting     Previous Medications   ADVAIR DISKUS 250-50 MCG/DOSE AEPB       ALBUTEROL (PROVENTIL HFA;VENTOLIN HFA) 108 (90 BASE) MCG/ACT INHALER    Inhale 2 puffs into the lungs every 6 (six) hours as needed for wheezing.   ALPRAZOLAM (XANAX) 0.5 MG TABLET    Take 1 tablet (0.5 mg total) by mouth at bedtime as needed for anxiety.   ASPIRIN-CAFFEINE (BC FAST PAIN RELIEF ARTHRITIS) 1000-65 MG PACK    Take 1 packet by mouth every 6 (six) hours as needed (Pain).   BISOPROLOL-HYDROCHLOROTHIAZIDE (ZIAC) 10-6.25 MG PER TABLET    TAKE 1 TABLET BY MOUTH DAILY   CIPROFLOXACIN (CILOXAN) 0.3 % OPHTHALMIC SOLUTION    Place 2 drops into the left eye every 4 (four) hours while awake.   ESTRADIOL (ESTRACE) 1 MG TABLET    Take 1 mg by mouth daily. Reported on 10/22/2015   FUROSEMIDE (LASIX) 20 MG TABLET    Take 1 tablet (20 mg total) by mouth daily.   IPRATROPIUM-ALBUTEROL (DUONEB) 0.5-2.5 (3) MG/3ML SOLN    Take 3 mLs by nebulization every 6 (six) hours.   NON FORMULARY    Apply 1 application topically 4 (four) times daily as needed (joint/ muscle pain). Perform Cool & Soothing Pain Relieving Roll-On (Menthol 3.5% topical)  OXYGEN       ROFLUMILAST (DALIRESP) 500 MCG TABS TABLET    Take 500 mcg by mouth daily.   SERTRALINE (ZOLOFT) 100 MG TABLET    Take 0.5 tablets (50 mg total) by mouth daily.   TIOTROPIUM (SPIRIVA) 18 MCG INHALATION CAPSULE    Place 1 capsule (18 mcg total) into inhaler and inhale daily.    Review of Systems  Constitutional: Positive for fatigue.  HENT: Positive for sore throat.   Eyes: Negative.   Respiratory: Positive for cough, shortness of breath and wheezing.   Cardiovascular: Negative.   Gastrointestinal: Negative.   Endocrine: Negative.   Genitourinary: Negative.   Musculoskeletal: Negative.   Skin: Negative.   Allergic/Immunologic: Negative.   Neurological: Negative.   Hematological: Negative.   Psychiatric/Behavioral: Negative.     Social History  Substance Use Topics  . Smoking status: Current Every Day  Smoker -- 1.00 packs/day for 40 years    Types: Cigarettes  . Smokeless tobacco: Never Used     Comment: 30+ years, pt states she has cut back to 4-5 cigarettes a day  . Alcohol Use: No   Objective:   BP 116/72 mmHg  Pulse 80  Temp(Src) 98.6 F (37 C) (Oral)  Resp 18  Wt 263 lb 6.4 oz (119.477 kg)  SpO2 95%  Physical Exam  Constitutional: She is oriented to person, place, and time. She appears well-developed and well-nourished.  HENT:  Head: Normocephalic.  Left Ear: External ear normal.  Slightly reddened posterior pharynx. No exudates or ulcerations. Clear mucus in nasal passages. Erythematous lesion on bridge of nose with raised rolled up border.  Eyes: Conjunctivae and EOM are normal.  Neck: Neck supple.  Cardiovascular: Normal rate and regular rhythm.   Pulmonary/Chest:  Distant breath sounds. Rare rales or wheeze at the present.  Abdominal: Soft. Bowel sounds are normal.  Lymphadenopathy:    She has no cervical adenopathy.  Neurological: She is alert and oriented to person, place, and time.  Psychiatric: She has a normal mood and affect. Her behavior is normal.      Assessment & Plan:      1. Chronic obstructive pulmonary disease with acute exacerbation ([place]) CXR on 10-22-15 was negative for pneumonia. CBC showed WBC count 14,400. Finished all the Levaquin and more stable. Still has cough, shortness of breath and wheeze which is her usual condition. Has a follow up appointment with pulmonologist on 11-02-15. Recheck WBC count and continue present oxygen, nebulizer treatment and MDI's as prescribed. - CBC with Differential/Platelet  2. Sore throat Exposure to sick friend. Negative rapid strep test. Treat with [employer]'s Magic Mouthwash and recheck as needed. - POCT rapid strep A  3. Chronic fatigue Family and patient concerned that exposure to others that smoke and use of fireplace for heat has cause her to get more CO build up. Had an episode in the past. Continues oxygen  2LPM 24 hours a day. Recheck with pulmonologist and cardiologist next week. - Carbon Monoxide, Blood  4. Basal cell carcinoma of nose Persistent lesion on bridge of nose. Has been trying to treat it with Neosporin and Band-Aid. Thought it was due to irritation from BiPAP mask at night but it won't heal. Raised rolled edged lesion probably a basal cell carcinoma. Schedule referral to dermatologist. - Ambulatory referral to Dermatology

## 2015-10-30 NOTE — Telephone Encounter (Signed)
Tanea with [facility] would like to know if they can switch pt from Diphenhyd-Hydrocort-Nystatin (FIRST-DUKES MOUTHWASH) SUSP  To [employer] Magic Mouth Wash without the first in it. She stated the insurance won't cover it as written. Please advise. Thanks TNP

## 2015-10-31 IMAGING — CR DG CHEST 1V PORT
1 series · 1 of 1 positions shown · non-contrast
Comparison: Chest radiograph performed 03/01/2015

CLINICAL DATA: Syncope. Headache and leg weakness. Neck pain.
Initial encounter.

EXAM:
PORTABLE CHEST - 1 VIEW

[portable]
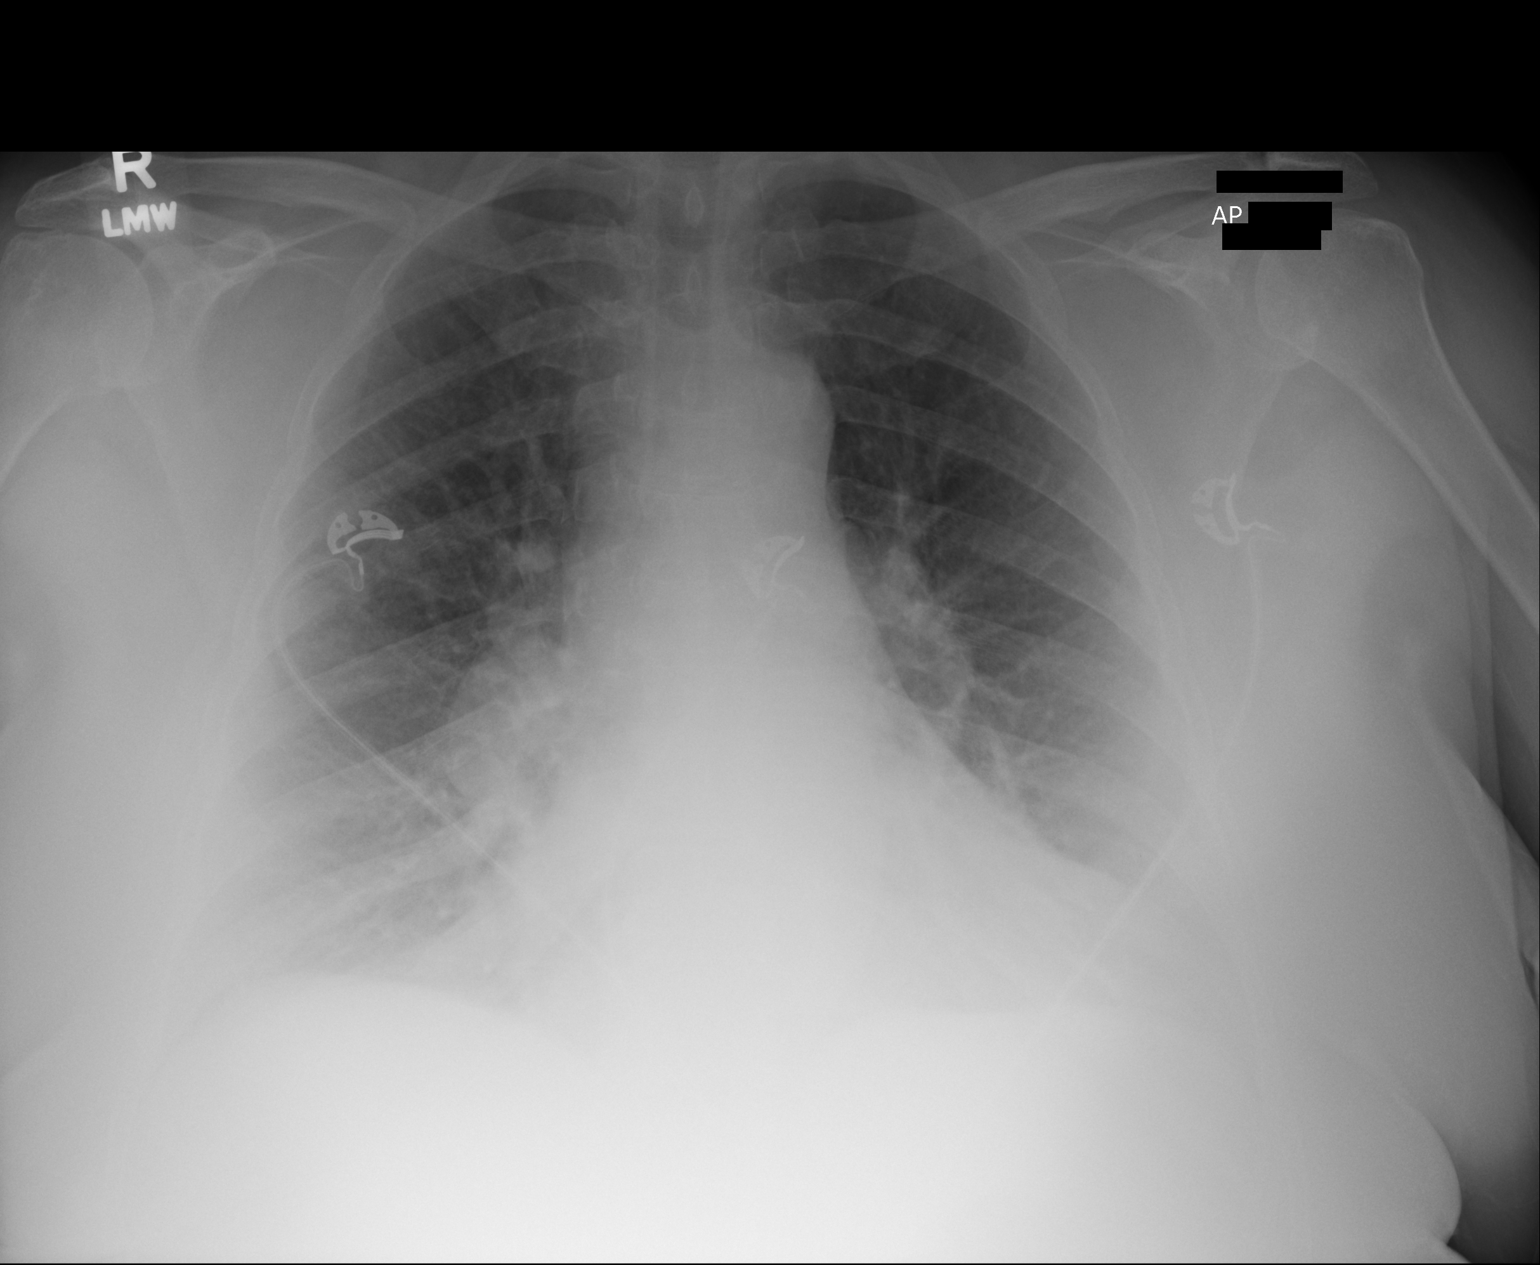

[1 of 1 positions shown; findings below may reference images not displayed]

FINDINGS: Mild vascular congestion is noted. Mild bibasilar atelectasis is
seen. No pleural effusion or pneumothorax is identified.

The cardiomediastinal silhouette is borderline normal in size. No
acute osseous abnormalities are identified.
IMPRESSION: Mild vascular congestion noted.  Mild bibasilar atelectasis seen.

## 2015-11-02 ENCOUNTER — Emergency Department: Payer: Medicare PPO

## 2015-11-02 ENCOUNTER — Inpatient Hospital Stay
Admission: EM | Admit: 2015-11-02 | Discharge: 2015-11-04 | DRG: 190 | Disposition: A | Discharge status: Home Health | Payer: Medicare PPO | Attending: Specialist | Admitting: Specialist

## 2015-11-02 DIAGNOSIS — I110 Hypertensive heart disease with heart failure: Secondary | ICD-10-CM | POA: Diagnosis present

## 2015-11-02 DIAGNOSIS — M545 Low back pain: Secondary | ICD-10-CM | POA: Diagnosis present

## 2015-11-02 DIAGNOSIS — N179 Acute kidney failure, unspecified: Secondary | ICD-10-CM | POA: Diagnosis present

## 2015-11-02 DIAGNOSIS — F419 Anxiety disorder, unspecified: Secondary | ICD-10-CM | POA: Diagnosis present

## 2015-11-02 DIAGNOSIS — Z79899 Other long term (current) drug therapy: Secondary | ICD-10-CM

## 2015-11-02 DIAGNOSIS — M1990 Unspecified osteoarthritis, unspecified site: Secondary | ICD-10-CM | POA: Diagnosis present

## 2015-11-02 DIAGNOSIS — F17210 Nicotine dependence, cigarettes, uncomplicated: Secondary | ICD-10-CM | POA: Diagnosis present

## 2015-11-02 DIAGNOSIS — J9621 Acute and chronic respiratory failure with hypoxia: Secondary | ICD-10-CM | POA: Diagnosis present

## 2015-11-02 DIAGNOSIS — Z9981 Dependence on supplemental oxygen: Secondary | ICD-10-CM

## 2015-11-02 DIAGNOSIS — G8929 Other chronic pain: Secondary | ICD-10-CM | POA: Diagnosis present

## 2015-11-02 DIAGNOSIS — F17201 Nicotine dependence, unspecified, in remission: Secondary | ICD-10-CM | POA: Diagnosis present

## 2015-11-02 DIAGNOSIS — F17200 Nicotine dependence, unspecified, uncomplicated: Secondary | ICD-10-CM | POA: Diagnosis present

## 2015-11-02 DIAGNOSIS — J441 Chronic obstructive pulmonary disease with (acute) exacerbation: Secondary | ICD-10-CM | POA: Diagnosis not present

## 2015-11-02 DIAGNOSIS — I5032 Chronic diastolic (congestive) heart failure: Secondary | ICD-10-CM | POA: Diagnosis present

## 2015-11-02 DIAGNOSIS — Z720 Tobacco use: Secondary | ICD-10-CM | POA: Diagnosis present

## 2015-11-02 DIAGNOSIS — F329 Major depressive disorder, single episode, unspecified: Secondary | ICD-10-CM | POA: Diagnosis present

## 2015-11-02 DIAGNOSIS — R0602 Shortness of breath: Secondary | ICD-10-CM | POA: Diagnosis not present

## 2015-11-02 LAB — CBC WITH DIFFERENTIAL/PLATELET
Basophils Absolute: 0.1 10*3/uL (ref 0.0–0.2)
Basophils Absolute: 0.1 10*3/uL (ref 0–0.1)
Basophils Relative: 1 %
Basos: 0 %
EOS (ABSOLUTE): 0.5 10*3/uL — ABNORMAL HIGH (ref 0.0–0.4)
Eos: 4 %
Eosinophils Absolute: 0.4 10*3/uL (ref 0–0.7)
Eosinophils Relative: 3 %
HCT: 46.7 % (ref 35.0–47.0)
Hematocrit: 45.5 % (ref 34.0–46.6)
Hemoglobin: 15.2 g/dL (ref 11.1–15.9)
Hemoglobin: 15.3 g/dL (ref 12.0–16.0)
Immature Grans (Abs): 0.2 10*3/uL — ABNORMAL HIGH (ref 0.0–0.1)
Immature Granulocytes: 1 %
Lymphocytes Absolute: 1.8 10*3/uL (ref 0.7–3.1)
Lymphocytes Relative: 18 %
Lymphs Abs: 2.4 10*3/uL (ref 1.0–3.6)
Lymphs: 14 %
MCH: 33.3 pg — ABNORMAL HIGH (ref 26.6–33.0)
MCH: 32.3 pg (ref 26.0–34.0)
MCHC: 33.4 g/dL (ref 31.5–35.7)
MCHC: 32.7 g/dL (ref 32.0–36.0)
MCV: 100 fL — ABNORMAL HIGH (ref 79–97)
MCV: 98.7 fL (ref 80.0–100.0)
Monocytes Absolute: 0.9 10*3/uL (ref 0.1–0.9)
Monocytes Absolute: 0.9 10*3/uL (ref 0.2–0.9)
Monocytes Relative: 6 %
Monocytes: 7 %
Neutro Abs: 9.8 10*3/uL — ABNORMAL HIGH (ref 1.4–6.5)
Neutrophils Absolute: 9.2 10*3/uL — ABNORMAL HIGH (ref 1.4–7.0)
Neutrophils Relative %: 72 %
Neutrophils: 74 %
Platelets: 172 10*3/uL (ref 150–379)
Platelets: 153 10*3/uL (ref 150–440)
RBC: 4.56 x10E6/uL (ref 3.77–5.28)
RBC: 4.74 MIL/uL (ref 3.80–5.20)
RDW: 13.8 % (ref 12.3–15.4)
RDW: 13.9 % (ref 11.5–14.5)
WBC: 12.6 10*3/uL — ABNORMAL HIGH (ref 3.4–10.8)
WBC: 13.5 10*3/uL — ABNORMAL HIGH (ref 3.6–11.0)

## 2015-11-02 LAB — TROPONIN I: Troponin I: <0.03 ng/mL (ref <0.031)

## 2015-11-02 LAB — CARBON MONOXIDE, BLOOD (PERFORMED AT REF LAB): Carbon Monoxide, Blood: 6 % — ABNORMAL HIGH (ref 0.0–1.9)

## 2015-11-02 LAB — BASIC METABOLIC PANEL
Anion gap: 8 (ref 5–15)
BUN: 21 mg/dL — ABNORMAL HIGH (ref 6–20)
CO2: 33 mmol/L — ABNORMAL HIGH (ref 22–32)
Calcium: 8.8 mg/dL — ABNORMAL LOW (ref 8.9–10.3)
Chloride: 101 mmol/L (ref 101–111)
Creatinine, Ser: 1.38 mg/dL — ABNORMAL HIGH (ref 0.44–1.00)
GFR calc Af Amer: 48 mL/min — ABNORMAL LOW (ref >60)
GFR calc non Af Amer: 41 mL/min — ABNORMAL LOW (ref >60)
Glucose, Bld: 104 mg/dL — ABNORMAL HIGH (ref 65–99)
Potassium: 4.4 mmol/L (ref 3.5–5.1)
Sodium: 142 mmol/L (ref 135–145)

## 2015-11-02 MED ORDER — ALBUTEROL SULFATE (2.5 MG/3ML) 0.083% IN NEBU
INHALATION_SOLUTION | RESPIRATORY_TRACT | Status: AC
  Administered 2015-11-02: 5 mg via RESPIRATORY_TRACT
  Filled 2015-11-02: qty 3

## 2015-11-02 MED ORDER — IPRATROPIUM-ALBUTEROL 0.5-2.5 (3) MG/3ML IN SOLN
3.0000 mL | Freq: Once | RESPIRATORY_TRACT | Status: AC
  Administered 2015-11-03: 3 mL via RESPIRATORY_TRACT
  Filled 2015-11-02: qty 3

## 2015-11-02 MED ORDER — ALBUTEROL SULFATE (2.5 MG/3ML) 0.083% IN NEBU
5.0000 mg | INHALATION_SOLUTION | Freq: Once | RESPIRATORY_TRACT | Status: AC
  Administered 2015-11-02: 5 mg via RESPIRATORY_TRACT
  Filled 2015-11-02: qty 3

## 2015-11-02 MED ORDER — METHYLPREDNISOLONE SODIUM SUCC 125 MG IJ SOLR
125.0000 mg | Freq: Once | INTRAMUSCULAR | Status: AC
Start: 2015-11-02 — End: 2015-11-03
  Administered 2015-11-03: 125 mg via INTRAVENOUS
  Filled 2015-11-02: qty 2

## 2015-11-02 NOTE — ED Notes (Signed)
Improved air entry bilaterally.  Inspiratory and expiratory wheezes bilaterally.   No SOB/ DOE currently.  Continue to monitor.

## 2015-11-02 NOTE — Telephone Encounter (Signed)
Patient advised as directed below. Patient verbalized understanding.

## 2015-11-02 NOTE — Telephone Encounter (Signed)
-----  Message from Margo Common, [place] sent at 11/02/2015  9:16 AM EST ----- WBC count coming down which indicates infection clearing. No report on carbon monoxide level yet.

## 2015-11-02 NOTE — ED Provider Notes (Signed)
[facility] Emergency Department Provider Note  ____________________________________________  Time seen: 11:15 PM  I have reviewed the triage vital signs and the nursing notes.   HISTORY  Chief Complaint Shortness of Breath    HPI Theresa [name] is a 60 y.o. female with history of COPD and CHF presents with progressive dyspnea since December 21 for which she seen her primary care provider twice and prescribed antibiotics and steroids. Patient states that she completed a course of Levaquin and prednisone 3 days ago. However patient states today she had acute worsening of shortness of breath with heavy sensation on her chest.    Past Medical History  Diagnosis Date  . COPD (chronic obstructive pulmonary disease) ([place])   . Hypertension   . Gastritis   . Lumbar radiculitis   . Muscle weakness   . DDD (degenerative disc disease), cervical   . Depression   . Chronic rhinitis   . Chronic generalized abdominal pain   . Anxiety   . Edema   . Carpal tunnel syndrome   . Low back pain   . Hematuria   . Malodorous urine   . Sensory urge incontinence   . Obesity   . Renal cyst   . Tobacco abuse   . CHF (congestive heart failure) [facility])     Patient Active Problem List   Diagnosis Date Noted  . Coagulation disorder ([place]) 10/22/2015  . Chronic pain syndrome (significant psychosocial component) 09/21/2015  . Chronic pain associated with significant psychosocial dysfunction 09/21/2015  . 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 09/21/2015  . Chronic pain 09/16/2015  . Long term current use of opiate analgesic 09/16/2015  . Long term prescription opiate use 09/16/2015  . Opiate use 09/16/2015  . Encounter for therapeutic drug level monitoring 09/16/2015  . Encounter for chronic pain management 09/16/2015  . Pain management 09/16/2015  . Neurogenic pain 09/16/2015  . Neuropathic pain 09/16/2015  . Musculoskeletal pain 09/16/2015  .  Lumbar spondylosis (L4-5) 09/16/2015  . Chronic low back pain 09/16/2015  . Chronic lower extremity pain (Bilateral) 09/16/2015  . Chronic lumbar radicular pain (Bilateral) 09/16/2015  . Chronic shoulder pain (Right side) 09/16/2015  . Chronic upper extremity pain (Right-sided) 09/16/2015  . Cervical radiculitis (Right side) 09/16/2015  . Abnormal x-ray of lumbar spine 09/16/2015  . Chronic diastolic heart failure ([place]) 07/15/2015  . Leg pain 07/15/2015  . Chest pain 07/15/2015  . Tobacco abuse 07/15/2015  . Chronic respiratory failure with hypoxia ([place]) 04/28/2015  . Blood clotting disorder ([place]) 04/21/2015  . Chronic respiratory acidosis 04/18/2015  . Hypoventilation, idiopathic 07/16/2014  . Decreased motor strength 07/16/2014  . Clinical depression 07/16/2014  . Chronic rhinitis 07/16/2014  . Carpal tunnel syndrome 07/16/2014  . Anxiety state 07/16/2014  . Neuritis or radiculitis due to rupture of lumbar intervertebral disc 07/02/2014  . DDD (degenerative disc disease), lumbar 07/02/2014  . Nicotine addiction 02/27/2014  . CAFL (chronic airflow limitation) ([place]) 02/27/2014  . Chronic obstructive pulmonary disease ([place]) 02/27/2014  . Essential (primary) hypertension 03/27/2008    Past Surgical History  Procedure Laterality Date  . Tubal ligation    . Abdominal hysterectomy    . Tonsillectomy    . Carpal tunnel release Left     Current Outpatient Rx  Name  Route  Sig  Dispense  Refill  . ADVAIR DISKUS 250-50 MCG/DOSE AEPB                 Dispense as written.   Marland Kitchen  albuterol (PROVENTIL HFA;VENTOLIN HFA) 108 (90 BASE) MCG/ACT inhaler   Inhalation   Inhale 2 puffs into the lungs every 6 (six) hours as needed for wheezing.         Marland Kitchen ALPRAZolam (XANAX) 0.5 MG tablet   Oral   Take 1 tablet (0.5 mg total) by mouth at bedtime as needed for anxiety.   20 tablet   0   . Aspirin-Caffeine (BC FAST PAIN RELIEF ARTHRITIS) 1000-65 MG PACK   Oral   Take 1 packet by mouth  every 6 (six) hours as needed (Pain).         . bisoprolol-hydrochlorothiazide (ZIAC) 10-6.25 MG per tablet      TAKE 1 TABLET BY MOUTH DAILY   90 tablet   1   . ciprofloxacin (CILOXAN) 0.3 % ophthalmic solution   Left Eye   Place 2 drops into the left eye every 4 (four) hours while awake.   5 mL   0   . Diphenhyd-Hydrocort-Nystatin (FIRST-DUKES MOUTHWASH) SUSP      One teaspoon (5 mL) gargled then swallowed after meals and at bedtime (four times a day) for sore throat.   237 mL   0   . estradiol (ESTRACE) 1 MG tablet   Oral   Take 1 mg by mouth daily. Reported on 10/22/2015         . furosemide (LASIX) 20 MG tablet   Oral   Take 1 tablet (20 mg total) by mouth daily.   30 tablet   5   . ipratropium-albuterol (DUONEB) 0.5-2.5 (3) MG/3ML SOLN   Nebulization   Take 3 mLs by nebulization every 6 (six) hours.   360 mL   0   . NON FORMULARY   Topical   Apply 1 application topically 4 (four) times daily as needed (joint/ muscle pain). Perform Cool & Soothing Pain Relieving Roll-On (Menthol 3.5% topical)         . OXYGEN               . roflumilast (DALIRESP) 500 MCG TABS tablet   Oral   Take 500 mcg by mouth daily.         . sertraline (ZOLOFT) 100 MG tablet   Oral   Take 0.5 tablets (50 mg total) by mouth daily.   30 tablet   3   . tiotropium (SPIRIVA) 18 MCG inhalation capsule   Inhalation   Place 1 capsule (18 mcg total) into inhaler and inhale daily.   30 capsule   12     Allergies Codeine  Family History  Problem Relation Age of Onset  . Kidney disease Mother   . Prostate cancer Neg Hx   . Kidney cancer Brother   . Lung cancer Sister   . Coronary artery disease Mother     Social History Social History  Substance Use Topics  . Smoking status: Current Every Day Smoker -- 1.00 packs/day for 40 years    Types: Cigarettes  . Smokeless tobacco: Never Used     Comment: 30+ years, pt states she has cut back to 4-5 cigarettes a day  .  Alcohol Use: No    Review of Systems  Constitutional: Negative for fever. Eyes: Negative for visual changes. ENT: Negative for sore throat. Cardiovascular: Negative for chest pain. Respiratory: Positive for shortness of breath. Gastrointestinal: Negative for abdominal pain, vomiting and diarrhea. Genitourinary: Negative for dysuria. Musculoskeletal: Negative for back pain. Skin: Negative for rash. Neurological: Negative for headaches, focal weakness or  numbness.   10-point ROS otherwise negative.  ____________________________________________   PHYSICAL EXAM:  VITAL SIGNS: ED Triage Vitals  Enc Vitals Group     BP 11/02/15 2208 114/77 mmHg     Pulse Rate 11/02/15 2208 74     Resp 11/02/15 2208 18     Temp 11/02/15 2208 98.3 F (36.8 C)     Temp Source 11/02/15 2208 Oral     SpO2 --      Weight 11/02/15 2208 253 lb (114.76 kg)     Height 11/02/15 2208 _0  (1.6 m)     Head Cir --      Peak Flow --      Pain Score 11/02/15 2210 8     Pain Loc --      Pain Edu? --      Excl. in [place]? --      Constitutional: Alert and oriented. Well appearing and in no distress. Eyes: Conjunctivae are normal. PERRL. Normal extraocular movements. ENT   Head: Normocephalic and atraumatic.   Nose: No congestion/rhinnorhea.   Mouth/Throat: Mucous membranes are moist.   Neck: No stridor. Hematological/Lymphatic/Immunilogical: No cervical lymphadenopathy. Cardiovascular: Normal rate, regular rhythm. Normal and symmetric distal pulses are present in all extremities. No murmurs, rubs, or gallops. Respiratory: Normal respiratory effort without tachypnea nor retractions. Breath sounds are clear and equal bilaterally. No wheezes/rales/rhonchi. Gastrointestinal: Soft and nontender. No distention. There is no CVA tenderness. Genitourinary: deferred Musculoskeletal: Nontender with normal range of motion in all extremities. No joint effusions.  No lower extremity tenderness nor  edema. Neurologic:  Normal speech and language. No gross focal neurologic deficits are appreciated. Speech is normal.  Skin:  Skin is warm, dry and intact. No rash noted. Psychiatric: Mood and affect are normal. Speech and behavior are normal. Patient exhibits appropriate insight and judgment.  ____________________________________________    LABS (pertinent positives/negatives)  Labs Reviewed  CBC WITH DIFFERENTIAL/PLATELET - Abnormal; Notable for the following:    WBC 13.5 (*)    Neutro Abs 9.8 (*)    All other components within normal limits  BASIC METABOLIC PANEL - Abnormal; Notable for the following:    CO2 33 (*)    Glucose, Bld 104 (*)    BUN 21 (*)    Creatinine, Ser 1.38 (*)    Calcium 8.8 (*)    GFR calc non Af Amer 41 (*)    GFR calc Af Amer 48 (*)    All other components within normal limits  BLOOD GAS, ARTERIAL - Abnormal; Notable for the following:    pCO2 arterial 61 (*)    pO2, Arterial 65 (*)    Bicarbonate 34.5 (*)    Acid-Base Excess 7.0 (*)    Allens test (pass/fail) POSITIVE (*)    All other components within normal limits  TROPONIN I  BRAIN NATRIURETIC PEPTIDE        RADIOLOGY DG Chest 2 View (Final result) Result time: 11/02/15 23:05:49   Final result by Rad Results In Interface (11/02/15 23:05:49)   Narrative:   CLINICAL DATA: 60 year old female with shortness of breath  EXAM: CHEST 2 VIEW  COMPARISON: Radiograph dated 10/22/2015  FINDINGS: The heart size and mediastinal contours are within normal limits. Both lungs are clear. The visualized skeletal structures are unremarkable.  IMPRESSION: No active cardiopulmonary disease.   Electronically Signed By: Anner Crete M.D. On: 11/02/2015 23:05            Critical Care performed: CRITICAL CARE Performed by: Gregor Hams  Total critical care time: 30 minutes  Critical care time was exclusive of separately billable procedures and treating other  patients.  Critical care was necessary to treat or prevent imminent or life-threatening deterioration.  Critical care was time spent personally by me on the following activities: development of treatment plan with patient and/or surrogate as well as nursing, discussions with consultants, evaluation of patient's response to treatment, examination of patient, obtaining history from patient or surrogate, ordering and performing treatments and interventions, ordering and review of laboratory studies, ordering and review of radiographic studies, pulse oximetry and re-evaluation of patient's condition.  ____________________________________________   INITIAL IMPRESSION / ASSESSMENT AND PLAN / ED COURSE  Pertinent labs & imaging results that were available during my care of the patient were reviewed by me and considered in my medical decision making (see chart for details).  Patient received 2 DuoNeb's as well as Solu-Medrol 125 mg emergency department with continued dyspnea. History of physical exam consistent with possible acute exacerbation of  COPD unrelieved with outpatient management of steroids and antibiotics.  ____________________________________________   FINAL CLINICAL IMPRESSION(S) / ED DIAGNOSES  Final diagnoses:  COPD exacerbation ([place])      Gregor Hams, MD 11/03/15 [phone]

## 2015-11-02 NOTE — ED Notes (Signed)
PT in with co shob pt has hx of copd and breathing worsened throughout the day.  Has had recent bronchitis, pt is on o2 at home 2.5l

## 2015-11-03 ENCOUNTER — Inpatient Hospital Stay
Admit: 2015-11-03 | Discharge: 2015-11-03 | Disposition: A | Discharge status: Home / Self Care | Payer: Medicare PPO | Attending: Internal Medicine | Admitting: Internal Medicine

## 2015-11-03 DIAGNOSIS — I5032 Chronic diastolic (congestive) heart failure: Secondary | ICD-10-CM | POA: Diagnosis present

## 2015-11-03 DIAGNOSIS — G8929 Other chronic pain: Secondary | ICD-10-CM | POA: Diagnosis present

## 2015-11-03 DIAGNOSIS — M1990 Unspecified osteoarthritis, unspecified site: Secondary | ICD-10-CM | POA: Diagnosis present

## 2015-11-03 DIAGNOSIS — Z79899 Other long term (current) drug therapy: Secondary | ICD-10-CM | POA: Diagnosis not present

## 2015-11-03 DIAGNOSIS — R0602 Shortness of breath: Secondary | ICD-10-CM | POA: Diagnosis present

## 2015-11-03 DIAGNOSIS — J9621 Acute and chronic respiratory failure with hypoxia: Secondary | ICD-10-CM | POA: Diagnosis present

## 2015-11-03 DIAGNOSIS — M545 Low back pain: Secondary | ICD-10-CM | POA: Diagnosis present

## 2015-11-03 DIAGNOSIS — F419 Anxiety disorder, unspecified: Secondary | ICD-10-CM | POA: Diagnosis present

## 2015-11-03 DIAGNOSIS — J441 Chronic obstructive pulmonary disease with (acute) exacerbation: Secondary | ICD-10-CM | POA: Diagnosis present

## 2015-11-03 DIAGNOSIS — I110 Hypertensive heart disease with heart failure: Secondary | ICD-10-CM | POA: Diagnosis present

## 2015-11-03 DIAGNOSIS — N179 Acute kidney failure, unspecified: Secondary | ICD-10-CM | POA: Diagnosis present

## 2015-11-03 DIAGNOSIS — F17210 Nicotine dependence, cigarettes, uncomplicated: Secondary | ICD-10-CM | POA: Diagnosis present

## 2015-11-03 DIAGNOSIS — F329 Major depressive disorder, single episode, unspecified: Secondary | ICD-10-CM | POA: Diagnosis present

## 2015-11-03 DIAGNOSIS — Z9981 Dependence on supplemental oxygen: Secondary | ICD-10-CM | POA: Diagnosis not present

## 2015-11-03 LAB — BLOOD GAS, ARTERIAL
Acid-Base Excess: 7 mmol/L — ABNORMAL HIGH (ref 0.0–3.0)
Allens test (pass/fail): POSITIVE — AB
Bicarbonate: 34.5 mEq/L — ABNORMAL HIGH (ref 21.0–28.0)
FIO2: 0.32
O2 Saturation: 91.5 %
Patient temperature: 37
pCO2 arterial: 61 mmHg — ABNORMAL HIGH (ref 32.0–48.0)
pH, Arterial: 7.36 (ref 7.350–7.450)
pO2, Arterial: 65 mmHg — ABNORMAL LOW (ref 83.0–108.0)

## 2015-11-03 LAB — BRAIN NATRIURETIC PEPTIDE: B Natriuretic Peptide: 29 pg/mL (ref 0.0–100.0)

## 2015-11-03 MED ORDER — IPRATROPIUM-ALBUTEROL 0.5-2.5 (3) MG/3ML IN SOLN
3.0000 mL | Freq: Four times a day (QID) | RESPIRATORY_TRACT | Status: DC
  Administered 2015-11-03: 3 mL via RESPIRATORY_TRACT
  Filled 2015-11-03: qty 3

## 2015-11-03 MED ORDER — CIPROFLOXACIN HCL 0.3 % OP SOLN
2.0000 [drp] | OPHTHALMIC | Status: DC
  Administered 2015-11-03 – 2015-11-04 (×6): 2 [drp] via OPHTHALMIC
  Filled 2015-11-03: qty 2.5

## 2015-11-03 MED ORDER — PREDNISONE 5 MG PO TABS: 30.0000 mg | ORAL_TABLET | Freq: Every day | ORAL | Status: DC

## 2015-11-03 MED ORDER — ALBUTEROL SULFATE (2.5 MG/3ML) 0.083% IN NEBU: 3.0000 mL | INHALATION_SOLUTION | Freq: Four times a day (QID) | RESPIRATORY_TRACT | Status: DC | PRN

## 2015-11-03 MED ORDER — TIOTROPIUM BROMIDE MONOHYDRATE 18 MCG IN CAPS
18.0000 ug | ORAL_CAPSULE | Freq: Every day | RESPIRATORY_TRACT | Status: DC
  Administered 2015-11-03 – 2015-11-04 (×2): 18 ug via RESPIRATORY_TRACT
  Filled 2015-11-03: qty 5

## 2015-11-03 MED ORDER — PREDNISONE 5 MG PO TABS: 5.0000 mg | ORAL_TABLET | Freq: Every day | ORAL | Status: DC

## 2015-11-03 MED ORDER — ACETAMINOPHEN 650 MG RE SUPP: 650.0000 mg | Freq: Four times a day (QID) | RECTAL | Status: DC | PRN

## 2015-11-03 MED ORDER — NICOTINE 21 MG/24HR TD PT24
21.0000 mg | MEDICATED_PATCH | Freq: Every day | TRANSDERMAL | Status: DC
  Administered 2015-11-03 – 2015-11-04 (×2): 21 mg via TRANSDERMAL
  Filled 2015-11-03 (×2): qty 1

## 2015-11-03 MED ORDER — ACETAMINOPHEN 325 MG PO TABS
650.0000 mg | ORAL_TABLET | Freq: Four times a day (QID) | ORAL | Status: DC | PRN
Start: 2015-11-03 — End: 2015-11-04
  Administered 2015-11-03 (×2): 650 mg via ORAL
  Filled 2015-11-03 (×2): qty 2

## 2015-11-03 MED ORDER — IPRATROPIUM-ALBUTEROL 0.5-2.5 (3) MG/3ML IN SOLN
3.0000 mL | Freq: Four times a day (QID) | RESPIRATORY_TRACT | Status: DC
  Administered 2015-11-03 – 2015-11-04 (×5): 3 mL via RESPIRATORY_TRACT
  Filled 2015-11-03 (×5): qty 3

## 2015-11-03 MED ORDER — ONDANSETRON HCL 4 MG/2ML IJ SOLN: 4.0000 mg | Freq: Four times a day (QID) | INTRAMUSCULAR | Status: DC | PRN

## 2015-11-03 MED ORDER — HEPARIN SODIUM (PORCINE) 5000 UNIT/ML IJ SOLN
5000.0000 [IU] | Freq: Three times a day (TID) | INTRAMUSCULAR | Status: DC
  Administered 2015-11-03 – 2015-11-04 (×4): 5000 [IU] via SUBCUTANEOUS
  Filled 2015-11-03 (×4): qty 1

## 2015-11-03 MED ORDER — SODIUM CHLORIDE 0.9 % IV SOLN: INTRAVENOUS | Status: DC

## 2015-11-03 MED ORDER — PREDNISONE 50 MG PO TABS: 50.0000 mg | ORAL_TABLET | Freq: Every day | ORAL | Status: DC

## 2015-11-03 MED ORDER — BISOPROLOL-HYDROCHLOROTHIAZIDE 10-6.25 MG PO TABS
1.0000 | ORAL_TABLET | Freq: Every day | ORAL | Status: DC
  Administered 2015-11-03 – 2015-11-04 (×2): 1 via ORAL
  Filled 2015-11-03 (×2): qty 1

## 2015-11-03 MED ORDER — ONDANSETRON HCL 4 MG PO TABS
4.0000 mg | ORAL_TABLET | Freq: Four times a day (QID) | ORAL | Status: DC | PRN
Start: 2015-11-03 — End: 2015-11-04

## 2015-11-03 MED ORDER — ALPRAZOLAM 0.5 MG PO TABS
0.5000 mg | ORAL_TABLET | Freq: Every evening | ORAL | Status: DC | PRN
  Administered 2015-11-03 (×2): 0.5 mg via ORAL
  Filled 2015-11-03 (×2): qty 1

## 2015-11-03 MED ORDER — PREDNISONE 20 MG PO TABS: 40.0000 mg | ORAL_TABLET | Freq: Every day | ORAL | Status: DC

## 2015-11-03 MED ORDER — PREDNISONE 20 MG PO TABS
60.0000 mg | ORAL_TABLET | Freq: Every day | ORAL | Status: AC
  Administered 2015-11-03 – 2015-11-04 (×2): 60 mg via ORAL
  Filled 2015-11-03 (×2): qty 3

## 2015-11-03 MED ORDER — SODIUM CHLORIDE 0.9 % IJ SOLN
3.0000 mL | Freq: Two times a day (BID) | INTRAMUSCULAR | Status: DC
  Administered 2015-11-03: 3 mL via INTRAVENOUS

## 2015-11-03 MED ORDER — LORATADINE 10 MG PO TABS
10.0000 mg | ORAL_TABLET | Freq: Every day | ORAL | Status: DC
  Administered 2015-11-03 – 2015-11-04 (×2): 10 mg via ORAL
  Filled 2015-11-03 (×2): qty 1

## 2015-11-03 MED ORDER — SERTRALINE HCL 100 MG PO TABS
100.0000 mg | ORAL_TABLET | Freq: Every day | ORAL | Status: DC
  Administered 2015-11-03 – 2015-11-04 (×2): 100 mg via ORAL
  Filled 2015-11-03 (×2): qty 1

## 2015-11-03 MED ORDER — DOCUSATE SODIUM 100 MG PO CAPS
100.0000 mg | ORAL_CAPSULE | Freq: Two times a day (BID) | ORAL | Status: DC
  Administered 2015-11-03 – 2015-11-04 (×3): 100 mg via ORAL
  Filled 2015-11-03 (×3): qty 1

## 2015-11-03 MED ORDER — MOMETASONE FURO-FORMOTEROL FUM 100-5 MCG/ACT IN AERO
2.0000 | INHALATION_SPRAY | Freq: Two times a day (BID) | RESPIRATORY_TRACT | Status: DC
Start: 2015-11-03 — End: 2015-11-03
  Administered 2015-11-03: 2 via RESPIRATORY_TRACT
  Filled 2015-11-03: qty 8.8

## 2015-11-03 MED ORDER — PREDNISONE 20 MG PO TABS: 20.0000 mg | ORAL_TABLET | Freq: Every day | ORAL | Status: DC

## 2015-11-03 MED ORDER — ROFLUMILAST 500 MCG PO TABS
500.0000 ug | ORAL_TABLET | Freq: Every day | ORAL | Status: DC
  Administered 2015-11-03 – 2015-11-04 (×2): 500 ug via ORAL
  Filled 2015-11-03 (×2): qty 1

## 2015-11-03 MED ORDER — FUROSEMIDE 20 MG PO TABS
20.0000 mg | ORAL_TABLET | Freq: Every day | ORAL | Status: DC
  Administered 2015-11-03 – 2015-11-04 (×2): 20 mg via ORAL
  Filled 2015-11-03 (×2): qty 1

## 2015-11-03 MED ORDER — SODIUM CHLORIDE 0.9 % IV SOLN
INTRAVENOUS | Status: DC
  Administered 2015-11-03: 03:00:00 via INTRAVENOUS

## 2015-11-03 MED ORDER — PREDNISONE 5 MG PO TABS: 10.0000 mg | ORAL_TABLET | Freq: Every day | ORAL | Status: DC

## 2015-11-03 MED ORDER — BUDESONIDE 0.25 MG/2ML IN SUSP
0.2500 mg | Freq: Two times a day (BID) | RESPIRATORY_TRACT | Status: DC
  Administered 2015-11-03 – 2015-11-04 (×2): 0.25 mg via RESPIRATORY_TRACT
  Filled 2015-11-03 (×2): qty 2

## 2015-11-03 NOTE — Consult Note (Signed)
[facility] Cardiology  CARDIOLOGY CONSULT NOTE  Patient ID: Theresa [name] MRN: [id number] DOB/AGE: [birth year] 59 y.o.  Admit date: 11/02/2015 Referring Physician Verdell Carmine Primary Physician [name] PA Primary Cardiologist [facility] Reason for Consultation congestive heart failure  HPI: 60 year old female referred for evaluation of congestive heart failure. Patient has a history of essential hypertension, normal left ventricular function with chronic diastolic congestive heart failure. The patient also has COPD, on home oxygen IPAP she presents with a several day history of worsening shortness of breath, vague chest discomfort, adductive cough, assisted with COPD exacerbation and fluid overload peripheral edema. Patient's been treated with advisor therapy, intravenous steroids and diuretics with overall clinical improvement. Admission labs were notable for negative troponin.  Review of systems complete and found to be negative unless listed above     Past Medical History  Diagnosis Date  . COPD (chronic obstructive pulmonary disease) ([place])   . Hypertension   . Gastritis   . Lumbar radiculitis   . Muscle weakness   . DDD (degenerative disc disease), cervical   . Depression   . Chronic rhinitis   . Chronic generalized abdominal pain   . Anxiety   . Edema   . Carpal tunnel syndrome   . Low back pain   . Hematuria   . Malodorous urine   . Sensory urge incontinence   . Obesity   . Renal cyst   . Tobacco abuse   . CHF (congestive heart failure) [facility])     Past Surgical History  Procedure Laterality Date  . Tubal ligation    . Abdominal hysterectomy    . Tonsillectomy    . Carpal tunnel release Left     Prescriptions prior to admission  Medication Sig Dispense Refill Last Dose  . ADVAIR DISKUS 250-50 MCG/DOSE AEPB Inhale 1 puff into the lungs 2 (two) times daily.    11/02/2015 at Unknown time  . albuterol (PROVENTIL HFA;VENTOLIN HFA) 108 (90 BASE) MCG/ACT inhaler Inhale 2 puffs  into the lungs every 6 (six) hours as needed for wheezing. Reported on 11/03/2015   prn at prn  . ALPRAZolam (XANAX) 0.5 MG tablet Take 1 tablet (0.5 mg total) by mouth at bedtime as needed for anxiety. 20 tablet 0 prn at prn  . Aspirin-Caffeine (BC FAST PAIN RELIEF ARTHRITIS) 1000-65 MG PACK Take 1 packet by mouth every 6 (six) hours as needed (Pain).   prn at prn  . bisoprolol-hydrochlorothiazide (ZIAC) 10-6.25 MG per tablet TAKE 1 TABLET BY MOUTH DAILY 90 tablet 1 11/02/2015 at Unknown time  . Cholecalciferol (VITAMIN D3) 5000 units TABS Take 1 tablet by mouth daily.   11/02/2015 at Unknown time  . ciprofloxacin (CILOXAN) 0.3 % ophthalmic solution Place 2 drops into the left eye every 4 (four) hours while awake. 5 mL 0 11/02/2015 at Unknown time  . furosemide (LASIX) 20 MG tablet Take 1 tablet (20 mg total) by mouth daily. 30 tablet 5 11/02/2015 at Unknown time  . ipratropium-albuterol (DUONEB) 0.5-2.5 (3) MG/3ML SOLN Take 3 mLs by nebulization every 6 (six) hours. (Patient taking differently: Take 3 mLs by nebulization every 6 (six) hours as needed (shortness of breath). ) 360 mL 0 prn at prn  . OXYGEN Inhale 2.5 L into the lungs continuous. Reported on 11/03/2015   11/03/2015 at Unknown time  . roflumilast (DALIRESP) 500 MCG TABS tablet Take 500 mcg by mouth daily.   11/03/2015 at Unknown time  . sertraline (ZOLOFT) 100 MG tablet Take 0.5 tablets (50 mg total) by  mouth daily. 30 tablet 3 11/02/2015 at Unknown time  . sertraline (ZOLOFT) 50 MG tablet Take 50 mg by mouth daily.   11/02/2015 at Unknown time  . tiotropium (SPIRIVA) 18 MCG inhalation capsule Place 1 capsule (18 mcg total) into inhaler and inhale daily. 30 capsule 12 11/02/2015 at Unknown time   Social History   Social History  . Marital Status: Married    Spouse Name: N/A  . Number of Children: N/A  . Years of Education: N/A   Occupational History  . Not on file.   Social History Main Topics  . Smoking status: Current Every Day Smoker  -- 1.00 packs/day for 40 years    Types: Cigarettes  . Smokeless tobacco: Never Used     Comment: 30+ years, pt states she has cut back to 4-5 cigarettes a day  . Alcohol Use: No  . Drug Use: No  . Sexual Activity: No   Other Topics Concern  . Not on file   Social History Narrative   Lives with her daughter    Family History  Problem Relation Age of Onset  . Kidney disease Mother   . Prostate cancer Neg Hx   . Kidney cancer Brother   . Lung cancer Sister   . Coronary artery disease Mother       Review of systems complete and found to be negative unless listed above      PHYSICAL EXAM  General: Well developed, well nourished, in no acute distress HEENT:  Normocephalic and atramatic Neck:  No JVD.  Lungs: Clear bilaterally to auscultation and percussion. Heart: HRRR . Normal S1 and S2 without gallops or murmurs.  Abdomen: Bowel sounds are positive, abdomen soft and non-tender  Msk:  Back normal, normal gait. Normal strength and tone for age. Extremities: No clubbing, cyanosis or edema.   Neuro: Alert and oriented X 3. Psych:  Good affect, responds appropriately  Labs:   Lab Results  Component Value Date   WBC 13.5* 11/02/2015   HGB 15.3 11/02/2015   HCT 46.7 11/02/2015   MCV 98.7 11/02/2015   PLT 153 11/02/2015    Recent Labs Lab 11/02/15 2219  NA 142  K 4.4  CL 101  CO2 33*  BUN 21*  CREATININE 1.38*  CALCIUM 8.8*  GLUCOSE 104*   Lab Results  Component Value Date   CKTOTAL 72 12/27/2012   CKMB 1.0 12/27/2012   TROPONINI <0.03 11/02/2015   No results found for: CHOL No results found for: HDL No results found for: LDLCALC No results found for: TRIG No results found for: CHOLHDL No results found for: LDLDIRECT    Radiology: Dg Chest 2 View  11/02/2015  CLINICAL DATA:  60 year old female with shortness of breath EXAM: CHEST  2 VIEW COMPARISON:  Radiograph dated 10/22/2015 FINDINGS: The heart size and mediastinal contours are within normal  limits. Both lungs are clear. The visualized skeletal structures are unremarkable. IMPRESSION: No active cardiopulmonary disease. Electronically Signed   By: Anner Crete M.D.   On: 11/02/2015 23:05   Dg Chest 2 View  10/22/2015  CLINICAL DATA:  Productive cough for 5 days. Coughing up green mucus. EXAM: CHEST  2 VIEW COMPARISON:  07/09/2015 FINDINGS: There is no focal parenchymal opacity. There is no pleural effusion or pneumothorax. The heart and mediastinal contours are unremarkable. The osseous structures are unremarkable. IMPRESSION: No active cardiopulmonary disease. Electronically Signed   By: Kathreen Devoid   On: 10/22/2015 15:04    EKG: Normal sinus  rhythm  ASSESSMENT AND PLAN:   1. Acute on chronic diastolic congestive heart failure, improved after diuresis, stable 2. COPD exacerbation  Recommendations  1. Continue current therapy 2. Counseled patient about low sodium diet 3. Continue diuresis 4. Review 2-D echocardiogram 5. Further recommendations pending echocardiogram results  Signed: Dekker Verga MD,PhD, [facility] 11/03/2015, 5:13 PM

## 2015-11-03 NOTE — Progress Notes (Signed)
[place] at [place] NAME: Theresa [name]    MR#:  [id number]  DATE OF BIRTH:  Dec 17, 1955  SUBJECTIVE:  CHIEF COMPLAINT:   Chief Complaint  Patient presents with  . Shortness of Breath    REVIEW OF SYSTEMS:    ROS  Nutrition: Heart Healthy Tolerating Diet: Yes Tolerating PT: Ambulatory.    DRUG ALLERGIES:   Allergies  Allergen Reactions  . Codeine Nausea And Vomiting    VITALS:  Blood pressure 108/63, pulse 84, temperature 97.8 F (36.6 C), temperature source Oral, resp. rate 18, height _0  (1.6 m), weight 118.616 kg (261 lb 8 oz), SpO2 95 %.  PHYSICAL EXAMINATION:   Physical Exam  GENERAL:  60 y.o.-year-old patient lying in the bed with no acute distress.  EYES: Pupils equal, round, reactive to light and accommodation. No scleral icterus. Extraocular muscles intact.  HEENT: Head atraumatic, normocephalic. Oropharynx and nasopharynx clear.  NECK:  Supple, no jugular venous distention. No thyroid enlargement, no tenderness.  LUNGS: Normal breath sounds bilaterally, no wheezing, rales, rhonchi. No use of accessory muscles of respiration.  CARDIOVASCULAR: S1, S2 normal. No murmurs, rubs, or gallops.  ABDOMEN: Soft, nontender, nondistended. Bowel sounds present. No organomegaly or mass.  EXTREMITIES: No cyanosis, clubbing or edema b/l.    NEUROLOGIC: Cranial nerves II through XII are intact. No focal Motor or sensory deficits b/l.   PSYCHIATRIC: The patient is alert and oriented x 3.  SKIN: No obvious rash, lesion, or ulcer.    LABORATORY PANEL:   CBC  Recent Labs Lab 11/02/15 2219  WBC 13.5*  HGB 15.3  HCT 46.7  PLT 153   ------------------------------------------------------------------------------------------------------------------  Chemistries   Recent Labs Lab 11/02/15 2219  NA 142  K 4.4  CL 101  CO2 33*  GLUCOSE 104*  BUN 21*  CREATININE 1.38*  CALCIUM 8.8*    ------------------------------------------------------------------------------------------------------------------  Cardiac Enzymes  Recent Labs Lab 11/02/15 2219  TROPONINI <0.03   ------------------------------------------------------------------------------------------------------------------  RADIOLOGY:  Dg Chest 2 View  11/02/2015  CLINICAL DATA:  60 year old female with shortness of breath EXAM: CHEST  2 VIEW COMPARISON:  Radiograph dated 10/22/2015 FINDINGS: The heart size and mediastinal contours are within normal limits. Both lungs are clear. The visualized skeletal structures are unremarkable. IMPRESSION: No active cardiopulmonary disease. Electronically Signed   By: Anner Crete M.D.   On: 11/02/2015 23:05     ASSESSMENT AND PLAN:   60 yo female w/ hx of COPD, HTN, Depression, Anxiety, hx of diastolic CHF, Chronic back pain, DJD, who presented to the hospital with shortness of breath and noted to be in acute on chronic respiratory failure.  #1 acute on chronic respiratory failure with hypoxia-due to COPD exacerbation. -Continue long-term steroid taper, continue duo nebs around-the-clock, Pulmicort nebs. -Patient is on oxygen at home.  #2 COPD exacerbation-continue treatment as mentioned above. -Continue Daliresp. Patient follows up with Dr. Raul Del as an outpatient.  #3 chest pain-vague and atypical in nature. -Likely related to underlying COPD. Await cardiology input. Await two-dimensional echocardiogram results.  #4 history of diastolic CHF-clinically he does not appear to be in CHF. -Continue Lasix. Continue bisoprolol HCTZ.  #5 depression/anxiety-continue Zoloft, Xanax as needed.   All the records are reviewed and case discussed with Care Management/Social Workerr. Management plans discussed with the patient, family and they are in agreement.  CODE STATUS: Full  DVT Prophylaxis: Heparin subcutaneous  TOTAL TIME TAKING CARE OF THIS PATIENT: 25  minutes.   POSSIBLE D/C  IN 1-2 DAYS, DEPENDING ON CLINICAL CONDITION.   Henreitta Leber M.D on 11/03/2015 at 3:38 PM  Between 7am to 6pm - Pager - [phone]  After 6pm go to [email or web address] - password [email or web address] [facility]  [facility]  Office  [phone]  CC: Primary care physician; [name], PA

## 2015-11-03 NOTE — H&P (Addendum)
Theresa [name] is an 60 y.o. female.   Chief Complaint: Shortness of breath HPI: The patient presents emergency department stating that she has been having difficulty breathing all day. This was more concerning to her as she's had some swelling in her feet and her dyspnea was not relieved by use of her home BiPAP. The patient denies cough or fever. She admits to some vague chest pain associated with heavy breathing. She denies nausea or vomiting. The patient has undergone a prolonged course of treatment for COPD exacerbation and respiratory failure. She just completed a course of Levaquin and steroids but has not improved. In the emergency department ABG showed hypoxemia of 65 which prompted the emergency department staff to call for admission.  Past Medical History  Diagnosis Date  . COPD (chronic obstructive pulmonary disease) ([place])   . Hypertension   . Gastritis   . Lumbar radiculitis   . Muscle weakness   . DDD (degenerative disc disease), cervical   . Depression   . Chronic rhinitis   . Chronic generalized abdominal pain   . Anxiety   . Edema   . Carpal tunnel syndrome   . Low back pain   . Hematuria   . Malodorous urine   . Sensory urge incontinence   . Obesity   . Renal cyst   . Tobacco abuse   . CHF (congestive heart failure) [facility])     Past Surgical History  Procedure Laterality Date  . Tubal ligation    . Abdominal hysterectomy    . Tonsillectomy    . Carpal tunnel release Left     Family History  Problem Relation Age of Onset  . Kidney disease Mother   . Prostate cancer Neg Hx   . Kidney cancer Brother   . Lung cancer Sister   . Coronary artery disease Mother    Social History:  reports that she has been smoking Cigarettes.  She has a 40 pack-year smoking history. She has never used smokeless tobacco. She reports that she does not drink alcohol or use illicit drugs.  Allergies:  Allergies  Allergen Reactions  . Codeine Nausea And Vomiting    Medications  Prior to Admission  Medication Sig Dispense Refill  . ADVAIR DISKUS 250-50 MCG/DOSE AEPB Inhale 1 puff into the lungs 2 (two) times daily.     Marland Kitchen albuterol (PROVENTIL HFA;VENTOLIN HFA) 108 (90 BASE) MCG/ACT inhaler Inhale 2 puffs into the lungs every 6 (six) hours as needed for wheezing. Reported on 11/03/2015    . ALPRAZolam (XANAX) 0.5 MG tablet Take 1 tablet (0.5 mg total) by mouth at bedtime as needed for anxiety. 20 tablet 0  . Aspirin-Caffeine (BC FAST PAIN RELIEF ARTHRITIS) 1000-65 MG PACK Take 1 packet by mouth every 6 (six) hours as needed (Pain).    . bisoprolol-hydrochlorothiazide (ZIAC) 10-6.25 MG per tablet TAKE 1 TABLET BY MOUTH DAILY 90 tablet 1  . Cholecalciferol (VITAMIN D3) 5000 units TABS Take 1 tablet by mouth daily.    . ciprofloxacin (CILOXAN) 0.3 % ophthalmic solution Place 2 drops into the left eye every 4 (four) hours while awake. 5 mL 0  . furosemide (LASIX) 20 MG tablet Take 1 tablet (20 mg total) by mouth daily. 30 tablet 5  . ipratropium-albuterol (DUONEB) 0.5-2.5 (3) MG/3ML SOLN Take 3 mLs by nebulization every 6 (six) hours. (Patient taking differently: Take 3 mLs by nebulization every 6 (six) hours as needed (shortness of breath). ) 360 mL 0  . OXYGEN Inhale 2.5 L  into the lungs continuous. Reported on 11/03/2015    . roflumilast (DALIRESP) 500 MCG TABS tablet Take 500 mcg by mouth daily.    . sertraline (ZOLOFT) 100 MG tablet Take 0.5 tablets (50 mg total) by mouth daily. 30 tablet 3  . sertraline (ZOLOFT) 50 MG tablet Take 50 mg by mouth daily.    Marland Kitchen tiotropium (SPIRIVA) 18 MCG inhalation capsule Place 1 capsule (18 mcg total) into inhaler and inhale daily. 30 capsule 12    Results for orders placed or performed during the hospital encounter of 11/02/15 (from the past 48 hour(s))  CBC with Differential     Status: Abnormal   Collection Time: 11/02/15 10:19 PM  Result Value Ref Range   WBC 13.5 (H) 3.6 - 11.0 K/uL   RBC 4.74 3.80 - 5.20 MIL/uL   Hemoglobin 15.3  12.0 - 16.0 g/dL   HCT 46.7 35.0 - 47.0 %   MCV 98.7 80.0 - 100.0 fL   MCH 32.3 26.0 - 34.0 pg   MCHC 32.7 32.0 - 36.0 g/dL   RDW 13.9 11.5 - 14.5 %   Platelets 153 150 - 440 K/uL   Neutrophils Relative % 72 %   Neutro Abs 9.8 (H) 1.4 - 6.5 K/uL   Lymphocytes Relative 18 %   Lymphs Abs 2.4 1.0 - 3.6 K/uL   Monocytes Relative 6 %   Monocytes Absolute 0.9 0.2 - 0.9 K/uL   Eosinophils Relative 3 %   Eosinophils Absolute 0.4 0 - 0.7 K/uL   Basophils Relative 1 %   Basophils Absolute 0.1 0 - 0.1 K/uL  Basic metabolic panel     Status: Abnormal   Collection Time: 11/02/15 10:19 PM  Result Value Ref Range   Sodium 142 135 - 145 mmol/L   Potassium 4.4 3.5 - 5.1 mmol/L   Chloride 101 101 - 111 mmol/L   CO2 33 (H) 22 - 32 mmol/L   Glucose, Bld 104 (H) 65 - 99 mg/dL   BUN 21 (H) 6 - 20 mg/dL   Creatinine, Ser 1.38 (H) 0.44 - 1.00 mg/dL   Calcium 8.8 (L) 8.9 - 10.3 mg/dL   GFR calc non Af Amer 41 (L) >60 mL/min   GFR calc Af Amer 48 (L) >60 mL/min    Comment: (NOTE) The eGFR has been calculated using the CKD EPI equation. This calculation has not been validated in all clinical situations. eGFR's persistently <60 mL/min signify possible Chronic Kidney Disease.    Anion gap 8 5 - 15  Troponin I     Status: None   Collection Time: 11/02/15 10:19 PM  Result Value Ref Range   Troponin I <0.03 <0.031 ng/mL    Comment:        NO INDICATION OF MYOCARDIAL INJURY.   Brain natriuretic peptide     Status: None   Collection Time: 11/02/15 10:19 PM  Result Value Ref Range   B Natriuretic Peptide 29.0 0.0 - 100.0 pg/mL  Blood gas, arterial     Status: Abnormal   Collection Time: 11/02/15 11:26 PM  Result Value Ref Range   FIO2 0.32    Delivery systems NASAL CANNULA    pH, Arterial 7.36 7.350 - 7.450   pCO2 arterial 61 (H) 32.0 - 48.0 mmHg   pO2, Arterial 65 (L) 83.0 - 108.0 mmHg   Bicarbonate 34.5 (H) 21.0 - 28.0 mEq/L   Acid-Base Excess 7.0 (H) 0.0 - 3.0 mmol/L   O2 Saturation 91.5 %    Patient temperature  37.0    Collection site LEFT RADIAL    Sample type ARTERIAL DRAW    Allens test (pass/fail) POSITIVE (A) PASS   Dg Chest 2 View  11/02/2015  CLINICAL DATA:  60 year old female with shortness of breath EXAM: CHEST  2 VIEW COMPARISON:  Radiograph dated 10/22/2015 FINDINGS: The heart size and mediastinal contours are within normal limits. Both lungs are clear. The visualized skeletal structures are unremarkable. IMPRESSION: No active cardiopulmonary disease. Electronically Signed   By: Anner Crete M.D.   On: 11/02/2015 23:05    Review of Systems  Constitutional: Positive for malaise/fatigue. Negative for fever and chills.  HENT: Negative for sore throat and tinnitus.   Eyes: Negative for blurred vision and redness.  Respiratory: Positive for shortness of breath. Negative for cough.   Cardiovascular: Negative for chest pain, palpitations, orthopnea and PND.  Gastrointestinal: Negative for nausea, vomiting, abdominal pain and diarrhea.  Genitourinary: Negative for dysuria, urgency and frequency.  Musculoskeletal: Negative for myalgias and joint pain.  Skin: Negative for rash.       No lesions  Neurological: Negative for speech change, focal weakness and weakness.  Endo/Heme/Allergies: Does not bruise/bleed easily.       No temperature intolerance  Psychiatric/Behavioral: Negative for depression and suicidal ideas.    Blood pressure 106/63, pulse 87, temperature 98.6 F (37 C), temperature source Oral, resp. rate 18, height 5' 3" (1.6 m), weight 118.616 kg (261 lb 8 oz), SpO2 94 %. Physical Exam  Vitals reviewed. Constitutional: She is oriented to person, place, and time. She appears well-developed and well-nourished. No distress.  HENT:  Head: Normocephalic and atraumatic.  Mouth/Throat: Oropharynx is clear and moist.  Eyes: Conjunctivae and EOM are normal. Pupils are equal, round, and reactive to light. No scleral icterus.  Neck: Normal range of motion. Neck  supple. No JVD present. No tracheal deviation present. No thyromegaly present.  Cardiovascular: Normal rate, regular rhythm and normal heart sounds.  Exam reveals no gallop and no friction rub.   No murmur heard. Respiratory: Effort normal and breath sounds normal.  GI: Soft. Bowel sounds are normal. She exhibits no distension. There is no tenderness.  Genitourinary:  Deferred  Musculoskeletal: Normal range of motion. She exhibits edema (Trace).  Lymphadenopathy:    She has no cervical adenopathy.  Neurological: She is alert and oriented to person, place, and time. No cranial nerve deficit. She exhibits normal muscle tone.  Skin: Skin is warm and dry.  Psychiatric: She has a normal mood and affect. Her behavior is normal. Judgment and thought content normal.     Assessment/Plan This is a 60 year old Caucasian female with past medical history of COPD admitted for acute on chronic respiratory failure with hypoxemia. 1. Acute on chronic respiratory failure: Hypoxemia present. The patient had been on 2 L of oxygen at home with exertion but now requires at least 4 L of oxygen. She is received a breathing treatment in the emergency department and appears more comfortable but still has some wheezing. Albuterol and DuoNeb's scheduled. The patient has also received Solu-Medrol in the emergency department and I have placed her on a long steroid taper. Given no sputum production or cough have deferred antibiotics at this time. 2. Congestive heart failure: Diastolic. It is unclear how much of her respiratory distress at this time is secondary to heart failure versus COPD. She reports difficulty lying down as well as more pedal edema than usual. I have ordered an echo to reevaluate her heart. Continue Lasix per  home regimen 3.  COPD: Continue inhaled corticosteroid and Spiriva 4. Acute kidney injury: I place the patient on a low rate of intravenous fluid. Avoid nephrotoxic agents. 5. Essential hypertension:  Continue bisoprolol with hydrochlorothiazide  6.Tobacco abuse: I have emphasized the importance of smoking cessation. NicoDerm patch while the patient is hospitalized 7. Depression: Continue Zoloft  8. DVT prophylaxis: Heparin 9. GI DVT prophylaxis: The patient is a full code. Time spent on admission orders and patient care approximately 45 minutes   Harrie Foreman 11/03/2015, 6:26 AM

## 2015-11-03 NOTE — Progress Notes (Signed)
*  PRELIMINARY RESULTS* Echocardiogram 2D Echocardiogram has been performed.  Laqueta Jean Hege 11/03/2015, 1:53 PM

## 2015-11-03 NOTE — Care Management Note (Signed)
Case Management Note  Patient Details  Name: Tuere Nwosu MRN: [id number] Date of Birth: 12/08/1955  Subjective/Objective:                  Met with patient to discuss discharge planning. She would like to return home at discharge. She lives with her husband and daughter. She requires ambulatory support with cane, walker, or sometime wheelchair. Her PCP is [facility]. Her Chronic O2 is through [facility]. She is currently open to [facility]. She is having problems paying for respiratory Rx.   Action/Plan: I have notified Brittney with [facility] of patient admission. RNCM to follow for discharge Rx. I told patient to check with [facility] to compare prices of Rx.   Expected Discharge Date:  11/05/15               Expected Discharge Plan:     In-House Referral:     Discharge planning Services  CM Consult  Post Acute Care Choice:  Home Health Choice offered to:  Patient  DME Arranged:    DME Agency:     HH Arranged:    [place] Agency:  [facility]  Status of Service:  In process, will continue to follow  Medicare Important Message Given:    Date Medicare IM Given:    Medicare IM give by:    Date Additional Medicare IM Given:    Additional Medicare Important Message give by:     If discussed at [place] of Stay Meetings, dates discussed:    Additional Comments:  Marshell Garfinkel, RN 11/03/2015, 9:13 AM

## 2015-11-03 NOTE — Progress Notes (Signed)
Notified physician about sinus headache , new orders received.

## 2015-11-04 MED ORDER — PREDNISONE 10 MG PO TABS: ORAL_TABLET | ORAL | Status: DC

## 2015-11-04 NOTE — Discharge Summary (Signed)
[facility] [place] NAME: Theresa [name]    MR#:  [id number]  DATE OF BIRTH:  1956-02-26  DATE OF ADMISSION:  11/02/2015 ADMITTING PHYSICIAN: Harrie Foreman, MD  DATE OF DISCHARGE: 11/04/2015 11:27 AM  PRIMARY CARE PHYSICIAN: [name], PA    ADMISSION DIAGNOSIS:  COPD exacerbation ([place]) [J44.1]  DISCHARGE DIAGNOSIS:  Principal Problem:   Acute on chronic respiratory failure with hypoxemia (HCC) Active Problems:   Chronic diastolic heart failure (HCC)   Tobacco abuse   Chronic obstructive pulmonary disease ([place])   SECONDARY DIAGNOSIS:   Past Medical History  Diagnosis Date  . COPD (chronic obstructive pulmonary disease) ([place])   . Hypertension   . Gastritis   . Lumbar radiculitis   . Muscle weakness   . DDD (degenerative disc disease), cervical   . Depression   . Chronic rhinitis   . Chronic generalized abdominal pain   . Anxiety   . Edema   . Carpal tunnel syndrome   . Low back pain   . Hematuria   . Malodorous urine   . Sensory urge incontinence   . Obesity   . Renal cyst   . Tobacco abuse   . CHF (congestive heart failure) [facility])     HOSPITAL COURSE:   60 yo female w/ hx of COPD, HTN, Depression, Anxiety, hx of diastolic CHF, Chronic back pain, DJD, who presented to the hospital with shortness of breath and noted to be in acute on chronic respiratory failure.  #1 acute on chronic respiratory failure with hypoxia-this was due to COPD exacerbation. -Patient was treated with oral steroids, Pulmicort nebs and DuoNeb's around-the-clock and has clinically improved since admission and therefore being discharged on oral steroid taper presently. -She is already on oxygen at home.  #2 COPD exacerbation-this improved with the treatment as mentioned above. She was just discharged back on her baseline inhalers and will continue follow-up with her pulmonologist Dr. Raul Del as an outpatient.   #3 chest pain-this  was atypical and likely noncardiac in nature. Patient was seen by cardiology who did not recommend any further intervention. Echocardiogram was done results of which are still pending can be further followed up as an outpatient. - this was Likely related to underlying COPD.   #4 history of diastolic CHF-patient was not in CHF while in the hospital. -She will Continue Lasix, bisoprolol HCTZ.  #5 depression/anxiety-she will continue Zoloft, Xanax as needed.  DISCHARGE CONDITIONS:   Stable  CONSULTS OBTAINED:  Treatment Team:  Isaias Cowman, MD  DRUG ALLERGIES:   Allergies  Allergen Reactions  . Codeine Nausea And Vomiting    DISCHARGE MEDICATIONS:   Discharge Medication List as of 11/04/2015 10:25 AM    START taking these medications   Details  predniSONE (DELTASONE) 10 MG tablet Label  & dispense according to the schedule below. 5 Pills PO for 2 days then, 4 Pills PO for 2 days, 3 Pills PO for 2 days, 2 Pills PO for 2 days, 1 Pill PO for 2 days then STOP., Print      CONTINUE these medications which have NOT CHANGED   Details  ADVAIR DISKUS 250-50 MCG/DOSE AEPB Inhale 1 puff into the lungs 2 (two) times daily. , Starting 09/13/2015, Until Discontinued, Historical Med    albuterol (PROVENTIL HFA;VENTOLIN HFA) 108 (90 BASE) MCG/ACT inhaler Inhale 2 puffs into the lungs every 6 (six) hours as needed for wheezing. Reported on 11/03/2015, Until Discontinued, Historical Med  ALPRAZolam (XANAX) 0.5 MG tablet Take 1 tablet (0.5 mg total) by mouth at bedtime as needed for anxiety., Starting 07/04/2015, Until Discontinued, Print    Aspirin-Caffeine (BC FAST PAIN RELIEF ARTHRITIS) 1000-65 MG PACK Take 1 packet by mouth every 6 (six) hours as needed (Pain)., Until Discontinued, Historical Med    bisoprolol-hydrochlorothiazide (ZIAC) 10-6.25 MG per tablet TAKE 1 TABLET BY MOUTH DAILY, Normal    Cholecalciferol (VITAMIN D3) 5000 units TABS Take 1 tablet by mouth daily., Until  Discontinued, Historical Med    ciprofloxacin (CILOXAN) 0.3 % ophthalmic solution Place 2 drops into the left eye every 4 (four) hours while awake., Starting 10/22/2015, Until Discontinued, Normal    furosemide (LASIX) 20 MG tablet Take 1 tablet (20 mg total) by mouth daily., Starting 07/24/2015, Until Discontinued, Normal    ipratropium-albuterol (DUONEB) 0.5-2.5 (3) MG/3ML SOLN Take 3 mLs by nebulization every 6 (six) hours., Starting 04/21/2015, Until Discontinued, Print    OXYGEN Inhale 2.5 L into the lungs continuous. Reported on 11/03/2015, Until Discontinued, Historical Med    roflumilast (DALIRESP) 500 MCG TABS tablet Take 500 mcg by mouth daily., Until Discontinued, Historical Med    !! sertraline (ZOLOFT) 100 MG tablet Take 0.5 tablets (50 mg total) by mouth daily., Starting 10/22/2015, Until Discontinued, Normal    !! sertraline (ZOLOFT) 50 MG tablet Take 50 mg by mouth daily., Until Discontinued, Historical Med    tiotropium (SPIRIVA) 18 MCG inhalation capsule Place 1 capsule (18 mcg total) into inhaler and inhale daily., Starting 07/04/2015, Until Discontinued, Normal     !! - Potential duplicate medications found. Please discuss with provider.       DISCHARGE INSTRUCTIONS:   DIET:  Cardiac diet  DISCHARGE CONDITION:  Stable  ACTIVITY:  Activity as tolerated  OXYGEN:  Home Oxygen: Yes.     Oxygen Delivery: 2 liters/min via Patient connected to nasal cannula oxygen  DISCHARGE LOCATION:  home   If you experience worsening of your admission symptoms, develop shortness of breath, life threatening emergency, suicidal or homicidal thoughts you must seek medical attention immediately by calling 911 or calling your MD immediately  if symptoms less severe.  You Must read complete instructions/literature along with all the possible adverse reactions/side effects for all the Medicines you take and that have been prescribed to you. Take any new Medicines after you have completely  understood and accpet all the possible adverse reactions/side effects.   Please note  You were cared for by a hospitalist during your hospital stay. If you have any questions about your discharge medications or the care you received while you were in the hospital after you are discharged, you can call the unit and asked to speak with the hospitalist on call if the hospitalist that took care of you is not available. Once you are discharged, your primary care physician will handle any further medical issues. Please note that NO REFILLS for any discharge medications will be authorized once you are discharged, as it is imperative that you return to your primary care physician (or establish a relationship with a primary care physician if you do not have one) for your aftercare needs so that they can reassess your need for medications and monitor your lab values.     Today   Shortness of breath, wheezing much improved. No fever, nausea, vomiting.  VITAL SIGNS:  Blood pressure 121/61, pulse 71, temperature 97.8 F (36.6 C), temperature source Oral, resp. rate 20, height _0  (1.6 m), weight 118.616 kg (261  lb 8 oz), SpO2 94 %.  I/O:   Intake/Output Summary (Last 24 hours) at 11/04/15 1617 Last data filed at 11/04/15 1015  Gross per 24 hour  Intake    600 ml  Output   1350 ml  Net   -750 ml    PHYSICAL EXAMINATION:   GENERAL: 60 y.o.-year-old Obese patient lying in the bed with no acute distress.  EYES: Pupils equal, round, reactive to light and accommodation. No scleral icterus. Extraocular muscles intact.  HEENT: Head atraumatic, normocephalic. Oropharynx and nasopharynx clear.  NECK: Supple, no jugular venous distention. No thyroid enlargement, no tenderness.  LUNGS: Normal breath sounds bilaterally, minimal end expiratory wheezing, No rales, rhonchi. No use of accessory muscles of respiration.  CARDIOVASCULAR: S1, S2 normal. No murmurs, rubs, or gallops.  ABDOMEN: Soft,  nontender, nondistended. Bowel sounds present. No organomegaly or mass.  EXTREMITIES: No cyanosis, clubbing or edema b/l.  NEUROLOGIC: Cranial nerves II through XII are intact. No focal Motor or sensory deficits b/l.  PSYCHIATRIC: The patient is alert and oriented x 3.  SKIN: No obvious rash, lesion, or ulcer.   DATA REVIEW:   CBC  Recent Labs Lab 11/02/15 2219  WBC 13.5*  HGB 15.3  HCT 46.7  PLT 153    Chemistries   Recent Labs Lab 11/02/15 2219  NA 142  K 4.4  CL 101  CO2 33*  GLUCOSE 104*  BUN 21*  CREATININE 1.38*  CALCIUM 8.8*    Cardiac Enzymes  Recent Labs Lab 11/02/15 2219  TROPONINI <0.03    RADIOLOGY:  Dg Chest 2 View  11/02/2015  CLINICAL DATA:  60 year old female with shortness of breath EXAM: CHEST  2 VIEW COMPARISON:  Radiograph dated 10/22/2015 FINDINGS: The heart size and mediastinal contours are within normal limits. Both lungs are clear. The visualized skeletal structures are unremarkable. IMPRESSION: No active cardiopulmonary disease. Electronically Signed   By: Anner Crete M.D.   On: 11/02/2015 23:05      Management plans discussed with the patient, family and they are in agreement.  CODE STATUS:  Code Status History    Date Active Date Inactive Code Status Order ID Comments User Context   11/03/2015  2:45 AM 11/04/2015  2:28 PM Full Code [id number]  Harrie Foreman, MD Inpatient   06/29/2015  3:12 AM 07/04/2015  3:55 PM Full Code [id number]  Lytle Butte, MD ED   04/18/2015  5:59 AM 04/21/2015  9:46 PM Full Code [id number]  Harrie Foreman, MD Inpatient      TOTAL TIME TAKING CARE OF THIS PATIENT: 40 minutes.    Henreitta Leber M.D on 11/04/2015 at 4:17 PM  Between 7am to 6pm - Pager - [phone]  After 6pm go to [email or web address] - password [email or web address]  [facility]  Office  [phone]  CC: Primary care physician; [name], PA

## 2015-11-04 NOTE — Progress Notes (Signed)
Discharge instruction given to pt verbalizes understanding , discharged via wheelchair with or care of family member

## 2015-11-04 NOTE — Care Management (Signed)
I have notified Brittney with [facility] that patient is discharging home today. No further RNCM needs.

## 2015-11-05 ENCOUNTER — Ambulatory Visit: Payer: Medicare PPO | Attending: Family | Admitting: Family

## 2015-11-05 ENCOUNTER — Encounter: Payer: Self-pay | Admitting: Family

## 2015-11-05 VITALS — BP 109/46 | HR 70 | Resp 20 | Ht 63.0 in | Wt 259.0 lb

## 2015-11-05 DIAGNOSIS — F329 Major depressive disorder, single episode, unspecified: Secondary | ICD-10-CM | POA: Insufficient documentation

## 2015-11-05 DIAGNOSIS — J441 Chronic obstructive pulmonary disease with (acute) exacerbation: Secondary | ICD-10-CM | POA: Diagnosis not present

## 2015-11-05 DIAGNOSIS — Z7982 Long term (current) use of aspirin: Secondary | ICD-10-CM | POA: Diagnosis not present

## 2015-11-05 DIAGNOSIS — G5600 Carpal tunnel syndrome, unspecified upper limb: Secondary | ICD-10-CM | POA: Insufficient documentation

## 2015-11-05 DIAGNOSIS — I5032 Chronic diastolic (congestive) heart failure: Secondary | ICD-10-CM | POA: Diagnosis not present

## 2015-11-05 DIAGNOSIS — F17210 Nicotine dependence, cigarettes, uncomplicated: Secondary | ICD-10-CM | POA: Diagnosis not present

## 2015-11-05 DIAGNOSIS — Z9889 Other specified postprocedural states: Secondary | ICD-10-CM | POA: Diagnosis not present

## 2015-11-05 DIAGNOSIS — Z79899 Other long term (current) drug therapy: Secondary | ICD-10-CM | POA: Insufficient documentation

## 2015-11-05 DIAGNOSIS — J310 Chronic rhinitis: Secondary | ICD-10-CM | POA: Diagnosis not present

## 2015-11-05 DIAGNOSIS — F32A Depression, unspecified: Secondary | ICD-10-CM

## 2015-11-05 DIAGNOSIS — Z888 Allergy status to other drugs, medicaments and biological substances status: Secondary | ICD-10-CM | POA: Insufficient documentation

## 2015-11-05 DIAGNOSIS — I110 Hypertensive heart disease with heart failure: Secondary | ICD-10-CM | POA: Insufficient documentation

## 2015-11-05 DIAGNOSIS — E669 Obesity, unspecified: Secondary | ICD-10-CM | POA: Insufficient documentation

## 2015-11-05 DIAGNOSIS — Z720 Tobacco use: Secondary | ICD-10-CM

## 2015-11-05 DIAGNOSIS — J410 Simple chronic bronchitis: Secondary | ICD-10-CM

## 2015-11-05 IMAGING — CR DG KNEE COMPLETE 4+V*R*
1 series · 4 of 4 positions shown · non-contrast
Comparison: June 15, 2010.

CLINICAL DATA: Right knee pain and swelling without known injury.

EXAM:
RIGHT KNEE - COMPLETE 4+ VIEW

[Series 1: dg knee complete 4 views right · 0.14mm/px · 4 of 4 slices shown]
[im 1/4]
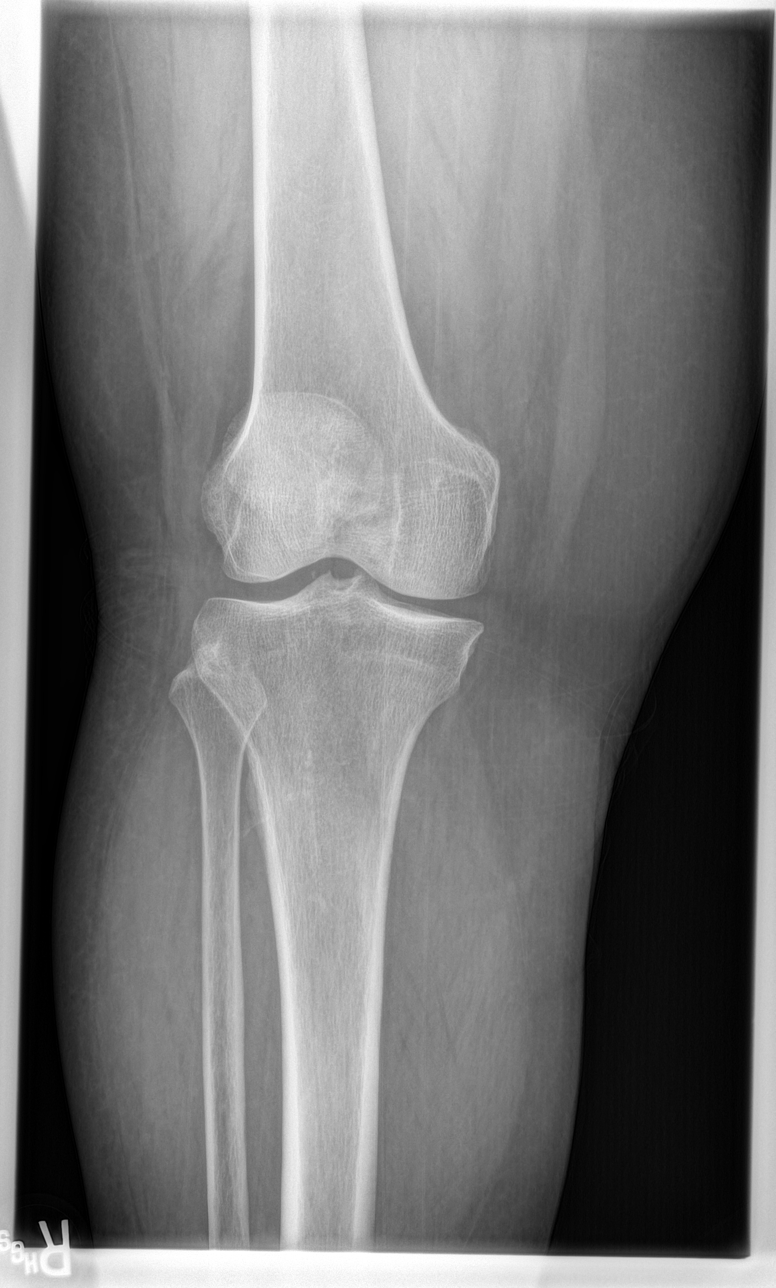
[im 2/4]
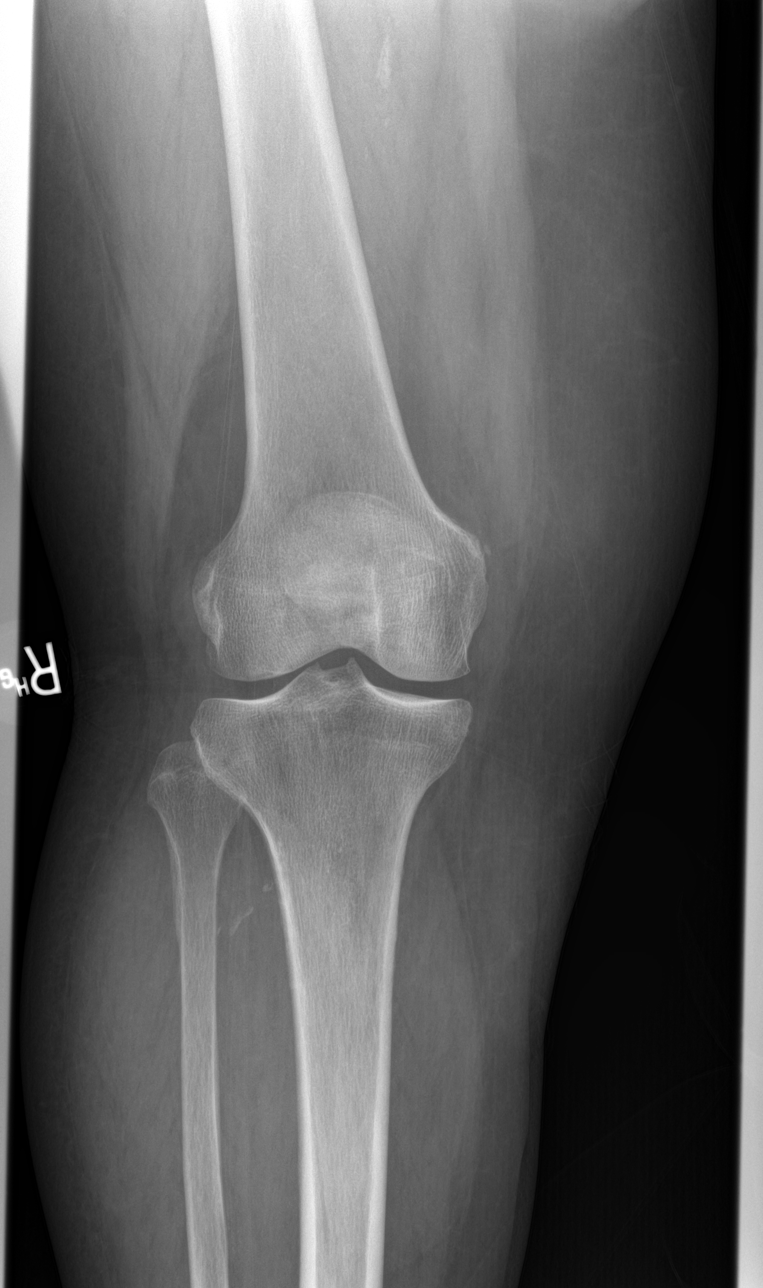
[im 3/4]
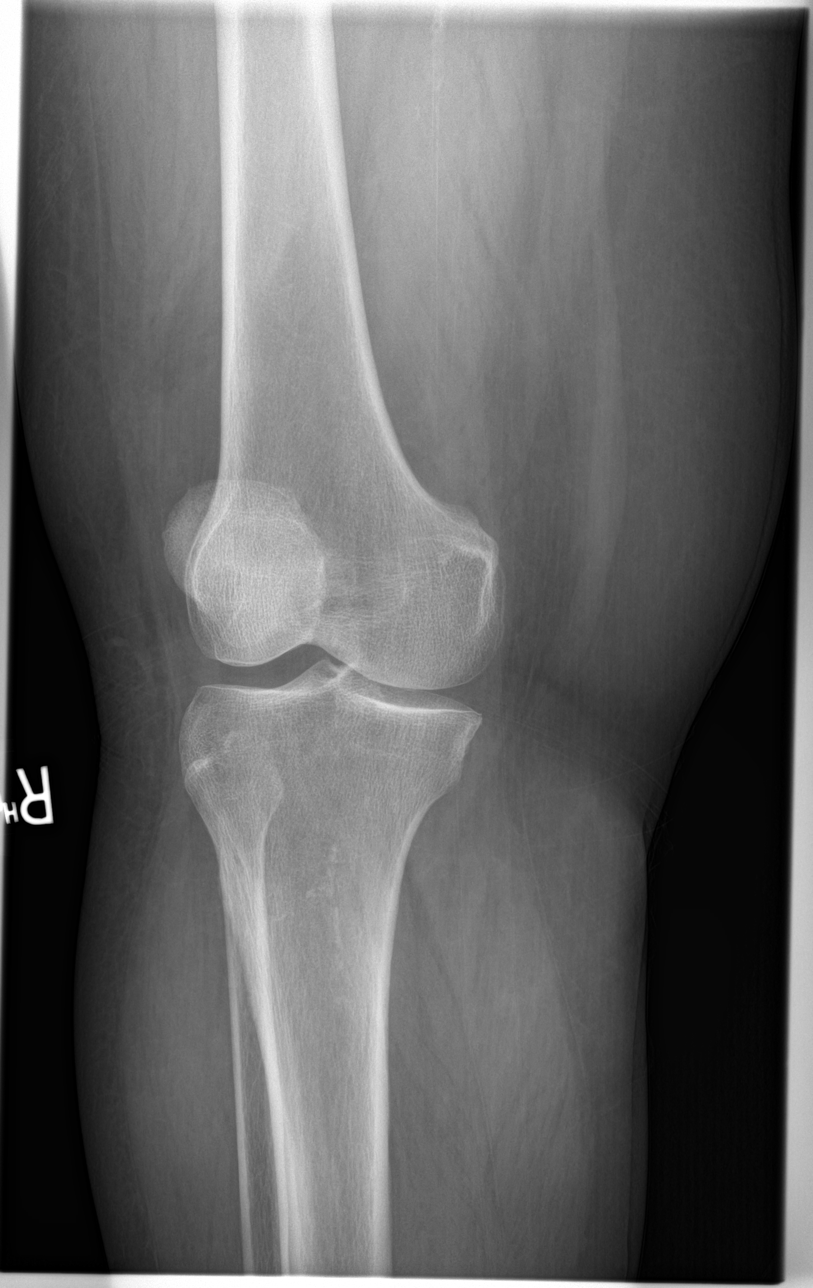
[im 4/4]
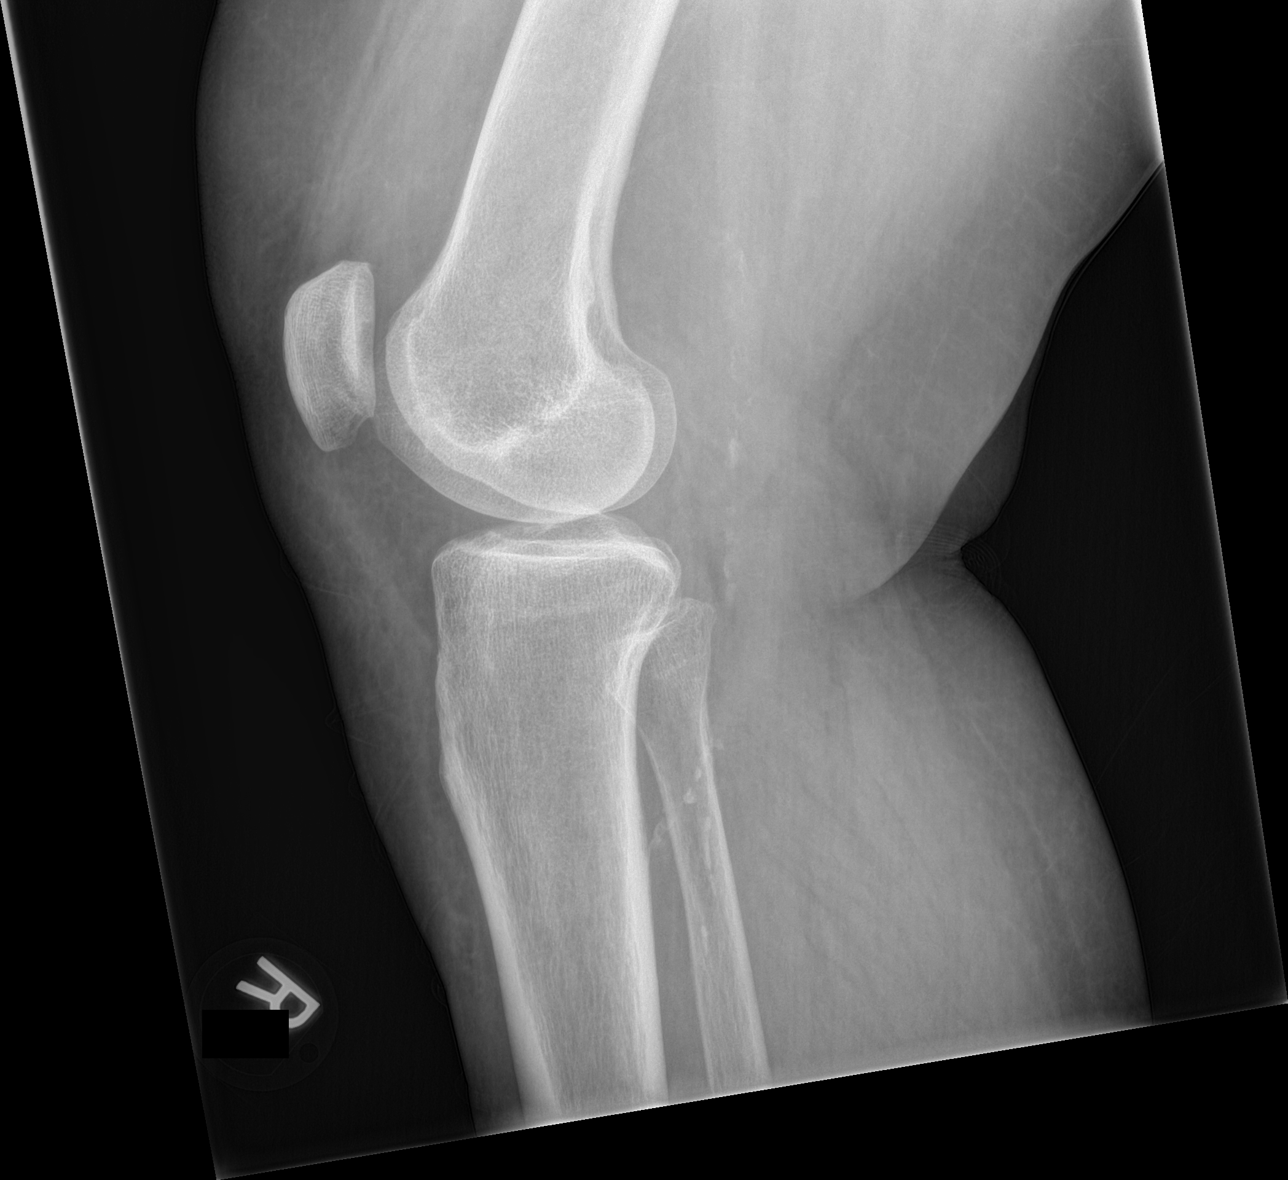

[4 of 4 positions shown; findings below may reference images not displayed]

FINDINGS: There is no evidence of fracture, dislocation, or joint effusion.
There is no evidence of arthropathy or other focal bone abnormality.
Soft tissues are unremarkable.
IMPRESSION: Normal right knee.

## 2015-11-05 NOTE — Patient Instructions (Signed)
Continue weighing daily and call for an overnight weight gain of > 2 pounds or a weekly weight gain of >5 pounds.    Smoking Cessation Quitting smoking is important to your health and has many advantages. However, it is not always easy to quit since nicotine is a very addictive drug. Oftentimes, people try 3 times or more before being able to quit. This document explains the best ways for you to prepare to quit smoking. Quitting takes hard work and a lot of effort, but you can do it. ADVANTAGES OF QUITTING SMOKING  You will live longer, feel better, and live better.  Your body will feel the impact of quitting smoking almost immediately.  Within 20 minutes, blood pressure decreases. Your pulse returns to its normal level.  After 8 hours, carbon monoxide levels in the blood return to normal. Your oxygen level increases.  After 24 hours, the chance of having a heart attack starts to decrease. Your breath, hair, and body stop smelling like smoke.  After 48 hours, damaged nerve endings begin to recover. Your sense of taste and smell improve.  After 72 hours, the body is virtually free of nicotine. Your bronchial tubes relax and breathing becomes easier.  After 2 to 12 weeks, lungs can hold more air. Exercise becomes easier and circulation improves.  The risk of having a heart attack, stroke, cancer, or lung disease is greatly reduced.  After 1 year, the risk of coronary heart disease is cut in half.  After 5 years, the risk of stroke falls to the same as a nonsmoker.  After 10 years, the risk of lung cancer is cut in half and the risk of other cancers decreases significantly.  After 15 years, the risk of coronary heart disease drops, usually to the level of a nonsmoker.  If you are pregnant, quitting smoking will improve your chances of having a healthy baby.  The people you live with, especially any children, will be healthier.  You will have extra money to spend on things other  than cigarettes. QUESTIONS TO THINK ABOUT BEFORE ATTEMPTING TO QUIT You may want to talk about your answers with your health care provider.  Why do you want to quit?  If you tried to quit in the past, what helped and what did not?  What will be the most difficult situations for you after you quit? How will you plan to handle them?  Who can help you through the tough times? Your family? Friends? A health care provider?  What pleasures do you get from smoking? What ways can you still get pleasure if you quit? Here are some questions to ask your health care provider:  How can you help me to be successful at quitting?  What medicine do you think would be best for me and how should I take it?  What should I do if I need more help?  What is smoking withdrawal like? How can I get information on withdrawal? GET READY  Set a quit date.  Change your environment by getting rid of all cigarettes, ashtrays, matches, and lighters in your home, car, or work. Do not let people smoke in your home.  Review your past attempts to quit. Think about what worked and what did not. GET SUPPORT AND ENCOURAGEMENT You have a better chance of being successful if you have help. You can get support in many ways.  Tell your family, friends, and coworkers that you are going to quit and need their support. Ask   them not to smoke around you.  Get individual, group, or telephone counseling and support. Programs are available at [employer] and health centers. Call your local health department for information about programs in your area.  Spiritual beliefs and practices may help some smokers quit.  Download a "quit meter" on your computer to keep track of quit statistics, such as how long you have gone without smoking, cigarettes not smoked, and money saved.  Get a self-help book about quitting smoking and staying off tobacco. [place] yourself from urges to smoke. Talk to  someone, go for a walk, or occupy your time with a task.  Change your normal routine. Take a different route to work. Drink tea instead of coffee. Eat breakfast in a different place.  Reduce your stress. Take a hot bath, exercise, or read a book.  Plan something enjoyable to do every day. Reward yourself for not smoking.  Explore interactive web-based programs that specialize in helping you quit. GET MEDICINE AND USE IT CORRECTLY Medicines can help you stop smoking and decrease the urge to smoke. Combining medicine with the above behavioral methods and support can greatly increase your chances of successfully quitting smoking.  Nicotine replacement therapy helps deliver nicotine to your body without the negative effects and risks of smoking. Nicotine replacement therapy includes nicotine gum, lozenges, inhalers, nasal sprays, and skin patches. Some may be available over-the-counter and others require a prescription.  Antidepressant medicine helps people abstain from smoking, but how this works is unknown. This medicine is available by prescription.  Nicotinic receptor partial agonist medicine simulates the effect of nicotine in your brain. This medicine is available by prescription. Ask your health care provider for advice about which medicines to use and how to use them based on your health history. Your health care provider will tell you what side effects to look out for if you choose to be on a medicine or therapy. Carefully read the information on the package. Do not use any other product containing nicotine while using a nicotine replacement product.  RELAPSE OR DIFFICULT SITUATIONS Most relapses occur within the first 3 months after quitting. Do not be discouraged if you start smoking again. Remember, most people try several times before finally quitting. You may have symptoms of withdrawal because your body is used to nicotine. You may crave cigarettes, be irritable, feel very hungry, cough  often, get headaches, or have difficulty concentrating. The withdrawal symptoms are only temporary. They are strongest when you first quit, but they will go away within 10-14 days. To reduce the chances of relapse, try to:  Avoid drinking alcohol. Drinking lowers your chances of successfully quitting.  Reduce the amount of caffeine you consume. Once you quit smoking, the amount of caffeine in your body increases and can give you symptoms, such as a rapid heartbeat, sweating, and anxiety.  Avoid smokers because they can make you want to smoke.  Do not let weight gain distract you. Many smokers will gain weight when they quit, usually less than 10 pounds. Eat a healthy diet and stay active. You can always lose the weight gained after you quit.  Find ways to improve your mood other than smoking. FOR MORE INFORMATION  www.smokefree.gov  Document Released: 09/27/2001 Document Revised: 02/17/2014 Document Reviewed: 01/12/2012 [facility] Patient Information 2015 [place], [place]. This information is not intended to replace advice given to you by your health care provider. Make sure you discuss any questions you have with your  health care provider.

## 2015-11-05 NOTE — Progress Notes (Signed)
Subjective:    Patient ID: Theresa [name], female    DOB: 1955-11-26, 60 y.o.   MRN: [id number]  Congestive Heart Failure Presents for follow-up visit. The disease course has been stable. Associated symptoms include edema ("very little"), fatigue and shortness of breath. Pertinent negatives include no abdominal pain, chest pain, orthopnea or palpitations. The symptoms have been stable. Past treatments include salt and fluid restriction and oxygen. The treatment provided mild relief. Compliance with prior treatments has been good. Her past medical history is significant for chronic lung disease and HTN. She has one 1st degree relative with heart disease. Compliance with total regimen is 76-100%. Compliance with exercise is 51-75%.  Shortness of Breath This is a chronic problem. The current episode started more than 1 year ago. The problem occurs daily. The problem has been unchanged. Associated symptoms include headaches ("all the time"), rhinorrhea and wheezing. Pertinent negatives include no abdominal pain, chest pain, leg swelling, neck pain or sore throat. The symptoms are aggravated by any activity and lying flat. Risk factors include smoking. She has tried beta agonist inhalers, ipratropium inhalers, oral steroids and steroid inhalers for the symptoms. The treatment provided mild relief. Her past medical history is significant for chronic lung disease and a heart failure.    Past Medical History  Diagnosis Date  . COPD (chronic obstructive pulmonary disease) ([place])   . Hypertension   . Gastritis   . Lumbar radiculitis   . Muscle weakness   . DDD (degenerative disc disease), cervical   . Depression   . Chronic rhinitis   . Chronic generalized abdominal pain   . Anxiety   . Edema   . Carpal tunnel syndrome   . Low back pain   . Hematuria   . Malodorous urine   . Sensory urge incontinence   . Obesity   . Renal cyst   . Tobacco abuse   . CHF (congestive heart failure) [facility])    Past  Surgical History  Procedure Laterality Date  . Tubal ligation    . Abdominal hysterectomy    . Tonsillectomy    . Carpal tunnel release Left     Family History  Problem Relation Age of Onset  . Kidney disease Mother   . Prostate cancer Neg Hx   . Kidney cancer Brother   . Lung cancer Sister   . Coronary artery disease Mother     Social History  Substance Use Topics  . Smoking status: Current Every Day Smoker -- 1.00 packs/day for 40 years    Types: Cigarettes  . Smokeless tobacco: Never Used     Comment: 30+ years, pt states she has cut back to 4-5 cigarettes a day  . Alcohol Use: No    Allergies  Allergen Reactions  . Codeine Nausea And Vomiting    Prior to Admission medications   Medication Sig Start Date End Date Taking? Authorizing Provider  ADVAIR DISKUS 250-50 MCG/DOSE AEPB Inhale 1 puff into the lungs 2 (two) times daily.  09/13/15  Yes Historical Provider, MD  albuterol (PROVENTIL HFA;VENTOLIN HFA) 108 (90 BASE) MCG/ACT inhaler Inhale 2 puffs into the lungs every 6 (six) hours as needed for wheezing. Reported on 11/03/2015   Yes Historical Provider, MD  ALPRAZolam Duanne Moron) 0.5 MG tablet Take 1 tablet (0.5 mg total) by mouth at bedtime as needed for anxiety. 07/04/15  Yes Fritzi Mandes, MD  bisoprolol-hydrochlorothiazide [facility]) 10-6.25 MG per tablet TAKE 1 TABLET BY MOUTH DAILY 05/31/15  Yes Margo Common, PA  Cholecalciferol (VITAMIN D3) 5000 units TABS Take 1 tablet by mouth daily.   Yes Historical Provider, MD  ciprofloxacin (CILOXAN) 0.3 % ophthalmic solution Place 2 drops into the left eye every 4 (four) hours while awake. 10/22/15  Yes Clearnce Sorrel Burnette, PA-C  dextromethorphan-guaiFENesin (MUCINEX DM) 30-600 MG 12hr tablet Take 1 tablet by mouth 2 (two) times daily.   Yes Historical Provider, MD  furosemide (LASIX) 20 MG tablet Take 1 tablet (20 mg total) by mouth daily. 07/24/15  Yes [name], FNP  ipratropium-albuterol (DUONEB) 0.5-2.5 (3) MG/3ML SOLN Take 3  mLs by nebulization every 6 (six) hours. Patient taking differently: Take 3 mLs by nebulization every 6 (six) hours as needed (shortness of breath).  04/21/15  Yes [name], MD  Mouthwash Compounding Base (MOUTH WASH-GP) LIQD Take 1 Dose by mouth daily as needed.   Yes Historical Provider, MD  predniSONE (DELTASONE) 10 MG tablet Label  & dispense according to the schedule below. 5 Pills PO for 2 days then, 4 Pills PO for 2 days, 3 Pills PO for 2 days, 2 Pills PO for 2 days, 1 Pill PO for 2 days then STOP. 11/04/15  Yes Henreitta Leber, MD  roflumilast (DALIRESP) 500 MCG TABS tablet Take 500 mcg by mouth daily.   Yes Historical Provider, MD  sertraline (ZOLOFT) 100 MG tablet Take 0.5 tablets (50 mg total) by mouth daily. 10/22/15  Yes Dennis E Chrismon, PA  sertraline (ZOLOFT) 50 MG tablet Take 50 mg by mouth daily.   Yes Historical Provider, MD  tiotropium (SPIRIVA) 18 MCG inhalation capsule Place 1 capsule (18 mcg total) into inhaler and inhale daily. 07/04/15  Yes Fritzi Mandes, MD  Aspirin-Caffeine (BC FAST PAIN RELIEF ARTHRITIS) 1000-65 MG PACK Take 1 packet by mouth every 6 (six) hours as needed (Pain).    Historical Provider, MD  OXYGEN Inhale 2.5 L into the lungs continuous. Reported on 11/03/2015    Historical Provider, MD     Review of Systems  Constitutional: Positive for fatigue. Negative for appetite change.  HENT: Positive for rhinorrhea. Negative for congestion and sore throat.   Eyes: Negative.   Respiratory: Positive for cough, shortness of breath and wheezing. Negative for chest tightness.   Cardiovascular: Negative for chest pain, palpitations and leg swelling.  Gastrointestinal: Positive for abdominal distention. Negative for abdominal pain.  Endocrine: Negative.   Genitourinary: Negative.   Musculoskeletal: Positive for back pain and arthralgias (right shoulder). Negative for neck pain.  Skin: Negative.   Allergic/Immunologic: Negative.   Neurological: Positive for numbness  (right hand/fingers) and headaches ("all the time"). Negative for dizziness and light-headedness.  Hematological: Negative for adenopathy. Does not bruise/bleed easily.  Psychiatric/Behavioral: Positive for sleep disturbance (sleeping "all the time". ) and dysphoric mood. Negative for suicidal ideas. The patient is not nervous/anxious.        Objective:   Physical Exam  Constitutional: She is oriented to person, place, and time. She appears well-developed and well-nourished.  HENT:  Head: Normocephalic and atraumatic.  Eyes: Conjunctivae are normal. Pupils are equal, round, and reactive to light.  Neck: Normal range of motion. Neck supple.  Cardiovascular: Normal rate and regular rhythm.   Pulmonary/Chest: Effort normal. She has wheezes in the right lower field, the left upper field and the left lower field. She has no rales.  Abdominal: Soft. She exhibits no distension. There is no tenderness.  Musculoskeletal: She exhibits no edema or tenderness.  Neurological: She is alert and oriented to person, place,  and time.  Skin: Skin is warm and dry.  Psychiatric: She has a normal mood and affect. Her behavior is normal. Thought content normal.  Nursing note and vitals reviewed.   BP 109/46 mmHg  Pulse 70  Resp 20  Ht _0  (1.6 m)  Wt 259 lb (117.482 kg)  BMI 45.89 kg/m2  SpO2 95%       Assessment & Plan:  1: Chronic heart failure with preserved ejection fraction- Patient presents with shortness of breath and fatigue upon exertion. She comes into the office in a wheelchair today. She describes occasional swelling in her legs but not much. She does elevate the legs as much as possible at home. She continues to weigh herself and says that her home weight has been stable. By our scale, her weight is essentially unchanged. Reminded to call for an overnight weight gain of >2 pounds or a weekly weight gain of >5 pounds. She is not adding any salt to her food and is trying to closely read food  labels. Continues to wear her oxygen at 2.5L around the clock along with her bipap at night.  2: COPD- Patient has recently been in the hospital with a COPD exacerbation. Is now beginning a prednisone taper along with her chronic inhaler use and nebulizer at home. Does have some expiratory wheezing heard. Has an appointment with her pulmonologist on 11/17/15. 3: Depression- She admits to feeling depressed but without suicidal thoughts. She says that she's currently taking 1/2 tablet of 150m dose in addition to a whole 548mtablet of her sertraline. Encouraged her to followup with her PCP as I'm not sure why she would need to be taking it like this. She's also been trying to get in with a psychiatrist per pain management's recommendation. 4: Tobacco use- Patient continues to smoke but says that she's smoking about 1/2 ppd of cigarettes. Removes her oxygen and doesn't smoke around it. Complete cessation discussed for 3 minutes with her.  Return here in 3 months or sooner for any questions/problems before then.

## 2015-11-06 ENCOUNTER — Telehealth: Payer: Self-pay | Admitting: Emergency Medicine

## 2015-11-06 NOTE — Telephone Encounter (Signed)
[facility] called to let you know they restarted home health for OT and PT.  CB# [phone] if any questions or concerns

## 2015-11-09 ENCOUNTER — Telehealth: Payer: Self-pay | Admitting: Family Medicine

## 2015-11-09 NOTE — Telephone Encounter (Signed)
Theresa [name] with [facility] is requesting a verbal order to continue physical therapy 1 time a for 1 week and 2 times a week for 6 weeks as of 11/06/2015.  CB#[phone]/[id number]

## 2015-11-09 NOTE — Telephone Encounter (Signed)
Send verbal authorization for physical therapy for PT and OT as per message.

## 2015-11-10 NOTE — Telephone Encounter (Signed)
Tatiana advised.   Thanks,   -Mickel Baas

## 2015-11-17 ENCOUNTER — Other Ambulatory Visit: Payer: Self-pay | Admitting: Family Medicine

## 2015-11-19 DIAGNOSIS — I2721 Secondary pulmonary arterial hypertension: Secondary | ICD-10-CM | POA: Insufficient documentation

## 2015-11-30 DIAGNOSIS — I10 Essential (primary) hypertension: Secondary | ICD-10-CM | POA: Diagnosis not present

## 2015-11-30 DIAGNOSIS — I5032 Chronic diastolic (congestive) heart failure: Secondary | ICD-10-CM | POA: Diagnosis not present

## 2015-11-30 DIAGNOSIS — M5030 Other cervical disc degeneration, unspecified cervical region: Secondary | ICD-10-CM | POA: Diagnosis not present

## 2015-11-30 DIAGNOSIS — J441 Chronic obstructive pulmonary disease with (acute) exacerbation: Secondary | ICD-10-CM | POA: Diagnosis not present

## 2015-12-07 ENCOUNTER — Telehealth: Payer: Self-pay | Admitting: Family Medicine

## 2015-12-07 DIAGNOSIS — F411 Generalized anxiety disorder: Secondary | ICD-10-CM

## 2015-12-07 MED ORDER — SERTRALINE HCL 100 MG PO TABS: 100.0000 mg | ORAL_TABLET | Freq: Every day | ORAL | Status: DC

## 2015-12-07 NOTE — Telephone Encounter (Signed)
Spoke with Melissa at [facility]. Per Melissa patient has been taking Sertraline 100 mg- 1 tablet daily. Sig is for patient to take 0.5 tablets daily. Melissa states because directions state 1/2 tablet daily insurance will not pay for a new RX, and patient is out of medication. Per last OV note patient is to take 0.5 tablet at bedtime, and occasionally 1 tablet if needed.  Please advise.

## 2015-12-07 NOTE — Telephone Encounter (Signed)
Will change dosage to 100 mg qd of Sertraline. Sent new prescription to [facility].

## 2015-12-07 NOTE — Telephone Encounter (Signed)
Melissa at [facility] called needing clarifications on Sertraline.    Their call back is [phone]  Thanks Con Memos

## 2015-12-08 ENCOUNTER — Other Ambulatory Visit: Payer: Self-pay | Admitting: Family Medicine

## 2015-12-28 ENCOUNTER — Encounter: Payer: Self-pay | Admitting: Pain Medicine

## 2015-12-28 ENCOUNTER — Ambulatory Visit: Payer: Medicare PPO | Attending: Pain Medicine | Admitting: Pain Medicine

## 2015-12-28 VITALS — BP 117/73 | HR 77 | Temp 98.6°F | Resp 20 | Ht 62.0 in | Wt 260.0 lb

## 2015-12-28 DIAGNOSIS — M25551 Pain in right hip: Secondary | ICD-10-CM | POA: Diagnosis not present

## 2015-12-28 DIAGNOSIS — Z5181 Encounter for therapeutic drug level monitoring: Secondary | ICD-10-CM | POA: Diagnosis not present

## 2015-12-28 DIAGNOSIS — J9610 Chronic respiratory failure, unspecified whether with hypoxia or hypercapnia: Secondary | ICD-10-CM | POA: Diagnosis not present

## 2015-12-28 DIAGNOSIS — E872 Acidosis: Secondary | ICD-10-CM | POA: Insufficient documentation

## 2015-12-28 DIAGNOSIS — I5032 Chronic diastolic (congestive) heart failure: Secondary | ICD-10-CM | POA: Diagnosis not present

## 2015-12-28 DIAGNOSIS — M5126 Other intervertebral disc displacement, lumbar region: Secondary | ICD-10-CM | POA: Diagnosis not present

## 2015-12-28 DIAGNOSIS — M25552 Pain in left hip: Secondary | ICD-10-CM | POA: Insufficient documentation

## 2015-12-28 DIAGNOSIS — F329 Major depressive disorder, single episode, unspecified: Secondary | ICD-10-CM | POA: Insufficient documentation

## 2015-12-28 DIAGNOSIS — J449 Chronic obstructive pulmonary disease, unspecified: Secondary | ICD-10-CM | POA: Diagnosis not present

## 2015-12-28 DIAGNOSIS — D7589 Other specified diseases of blood and blood-forming organs: Secondary | ICD-10-CM | POA: Insufficient documentation

## 2015-12-28 DIAGNOSIS — M4726 Other spondylosis with radiculopathy, lumbar region: Secondary | ICD-10-CM

## 2015-12-28 DIAGNOSIS — J310 Chronic rhinitis: Secondary | ICD-10-CM | POA: Diagnosis not present

## 2015-12-28 DIAGNOSIS — F17210 Nicotine dependence, cigarettes, uncomplicated: Secondary | ICD-10-CM | POA: Insufficient documentation

## 2015-12-28 DIAGNOSIS — I10 Essential (primary) hypertension: Secondary | ICD-10-CM | POA: Diagnosis not present

## 2015-12-28 DIAGNOSIS — G5600 Carpal tunnel syndrome, unspecified upper limb: Secondary | ICD-10-CM | POA: Insufficient documentation

## 2015-12-28 DIAGNOSIS — G8929 Other chronic pain: Secondary | ICD-10-CM | POA: Insufficient documentation

## 2015-12-28 DIAGNOSIS — Z6841 Body Mass Index (BMI) 40.0 and over, adult: Secondary | ICD-10-CM | POA: Insufficient documentation

## 2015-12-28 DIAGNOSIS — R0689 Other abnormalities of breathing: Secondary | ICD-10-CM | POA: Diagnosis not present

## 2015-12-28 DIAGNOSIS — M5116 Intervertebral disc disorders with radiculopathy, lumbar region: Secondary | ICD-10-CM | POA: Diagnosis not present

## 2015-12-28 DIAGNOSIS — M6281 Muscle weakness (generalized): Secondary | ICD-10-CM | POA: Insufficient documentation

## 2015-12-28 DIAGNOSIS — M549 Dorsalgia, unspecified: Secondary | ICD-10-CM | POA: Diagnosis present

## 2015-12-28 DIAGNOSIS — M25511 Pain in right shoulder: Secondary | ICD-10-CM | POA: Diagnosis not present

## 2015-12-28 DIAGNOSIS — K2970 Gastritis, unspecified, without bleeding: Secondary | ICD-10-CM | POA: Diagnosis not present

## 2015-12-28 DIAGNOSIS — R1084 Generalized abdominal pain: Secondary | ICD-10-CM | POA: Insufficient documentation

## 2015-12-28 DIAGNOSIS — M79606 Pain in leg, unspecified: Secondary | ICD-10-CM | POA: Diagnosis not present

## 2015-12-28 DIAGNOSIS — M791 Myalgia: Secondary | ICD-10-CM | POA: Diagnosis not present

## 2015-12-28 DIAGNOSIS — M79601 Pain in right arm: Secondary | ICD-10-CM | POA: Insufficient documentation

## 2015-12-28 DIAGNOSIS — M47896 Other spondylosis, lumbar region: Secondary | ICD-10-CM | POA: Diagnosis not present

## 2015-12-28 DIAGNOSIS — N3941 Urge incontinence: Secondary | ICD-10-CM | POA: Diagnosis not present

## 2015-12-28 DIAGNOSIS — M25559 Pain in unspecified hip: Secondary | ICD-10-CM | POA: Insufficient documentation

## 2015-12-28 DIAGNOSIS — M160 Bilateral primary osteoarthritis of hip: Secondary | ICD-10-CM | POA: Diagnosis not present

## 2015-12-28 MED ORDER — HYDROCODONE-ACETAMINOPHEN 5-325 MG PO TABS: 1.0000 | ORAL_TABLET | Freq: Three times a day (TID) | ORAL | Status: DC | PRN

## 2015-12-28 NOTE — Progress Notes (Signed)
Safety precautions to be maintained throughout the outpatient stay will include: orient to surroundings, keep bed in low position, maintain call bell within reach at all times, provide assistance with transfer out of bed and ambulation.

## 2015-12-28 NOTE — Progress Notes (Signed)
Patient's Name: Theresa [name] MRN: [id number] DOB: 17-Mar-1956 DOS: 12/28/2015  Primary Reason(s) for Visit: Encounter for Medication Management CC: Leg Pain and Back Pain   HPI  Theresa [name] is a 60 y.o. year old, female patient, who returns today as an established patient. She has Chronic respiratory acidosis; Hypoventilation, idiopathic; Nicotine addiction; Neuritis or radiculitis due to rupture of lumbar intervertebral disc; Decreased motor strength; Essential (primary) hypertension; Clinical depression; DDD (degenerative disc disease), lumbar; CAFL (chronic airflow limitation) ([place]); Chronic rhinitis; Carpal tunnel syndrome; Blood clotting disorder ([place]); Anxiety state; Chronic respiratory failure with hypoxia ([place]); Chronic diastolic heart failure ([place]); Leg pain; Chest pain; Tobacco abuse; Chronic pain; Encounter for therapeutic drug level monitoring; Encounter for chronic pain management; Pain management; Neurogenic pain; Neuropathic pain; Musculoskeletal pain; Lumbar spondylosis (L4-5); Chronic low back pain; Chronic lower extremity pain (Location of Primary Source of Pain) (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); Chronic lumbar radicular pain (Bilateral); Chronic shoulder pain (Right side); Chronic upper extremity pain (Right-sided); Cervical radiculitis (Right side); Abnormal x-ray of lumbar spine; Chronic pain syndrome (significant psychosocial component); Chronic pain associated with significant psychosocial dysfunction; 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; Chronic obstructive pulmonary disease ([place]); Coagulation disorder ([place]); Chronic hip pain (Location of Primary Source of Pain) (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); 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); Obesity, Class III, BMI 40-49.9 (morbid obesity) ([place]); and 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[place]) on her problem list.. Her primarily concern today is the Leg Pain and Back Pain   Today we spoke to the patient about the pain score, we have  requested that she stop smoking, and we have also requested that she lower her BMI to less than 30. We will start her on hydrocodone and we have provided the patient with information about the risks and possible consequences including the risk of respiratory depression and death.  Pain Assessment: Self-Reported Pain Score: 10-Worst pain ever, clinically she looks like a 3-4/10. Reported level is inconsistent with clinical obrservations Pain Type: Chronic pain Pain Location: Leg (back) Pain Orientation: Left Pain Descriptors / Indicators: Throbbing, Constant Pain Frequency: Constant  Date of Last Visit: 09/16/15 Service Provided on Last Visit: Evaluation (new patient)  Controlled Substance Pharmacotherapy Assessment  Analgesic: None at this time. MME/day: None at this time.  Monitoring: [place] PMP: [place] database shows the patient to have used hydrocodone in the past. UDS Results/interpretation: Last UDS was done on 09/16/2015 and it came back within normal limits with no drugs detected.  Risk Assessment: Aberrant Behavior: None observed today Substance Use Disorder (SUD) Risk Level: Moderate-to-Low Opioid Risk Tool (ORT) Score: Total Score: 7 Moderate Risk for SUD (Score between 4-7) Depression Scale Score: PHQ-2: PHQ-2 Total Score: 0 No depression (0) PHQ-9: PHQ-9 Total Score: 0 No depression (0-4)  Pharmacologic Plan: No change in therapy, at this time   Laboratory Workup  Last ED UDS: No results found for: THCU, COCAINSCRNUR, PCPSCRNUR, MDMA, AMPHETMU, METHADONE, ETOH  Inflammation Markers Lab Results  Component Value Date   ESRSEDRATE 5 09/17/2015   CRP 1.1* 09/17/2015    Renal Function Lab Results  Component Value Date   BUN 21* 11/02/2015   CREATININE 1.38* 11/02/2015   GFRAA 48* 11/02/2015   GFRNONAA 41* 11/02/2015    Hepatic Function Lab Results  Component Value Date   AST 19 06/29/2015   ALT 17 06/29/2015   ALBUMIN 3.8 06/29/2015    Electrolytes Lab  Results  Component Value Date   NA 142 11/02/2015   K 4.4 11/02/2015   CL 101 11/02/2015  CALCIUM 8.8* 11/02/2015   MG 2.1 09/17/2015    Allergies  Theresa [name] is allergic to codeine.  Meds  The patient has a current medication list which includes the following prescription(s): advair diskus, albuterol, alprazolam, aspirin-caffeine, bisoprolol-hydrochlorothiazide, vitamin d3, furosemide, ipratropium-albuterol, oxygen-helium, roflumilast, sertraline, tiotropium, and hydrocodone-acetaminophen.  Current Outpatient Prescriptions on File Prior to Visit  Medication Sig  . ADVAIR DISKUS 250-50 MCG/DOSE AEPB Inhale 1 puff into the lungs 2 (two) times daily.   Marland Kitchen albuterol (PROVENTIL HFA;VENTOLIN HFA) 108 (90 BASE) MCG/ACT inhaler Inhale 2 puffs into the lungs every 6 (six) hours as needed for wheezing. Reported on 11/03/2015  . ALPRAZolam (XANAX) 0.5 MG tablet Take 1 tablet (0.5 mg total) by mouth at bedtime as needed for anxiety. (Patient taking differently: Take 0.5 mg by mouth 2 (two) times daily as needed for anxiety. )  . Aspirin-Caffeine (BC FAST PAIN RELIEF ARTHRITIS) 1000-65 MG PACK Take 1 packet by mouth every 6 (six) hours as needed (Pain).  . bisoprolol-hydrochlorothiazide (ZIAC) 10-6.25 MG tablet TAKE 1 TABLET BY MOUTH DAILY  . Cholecalciferol (VITAMIN D3) 5000 units TABS Take 1 tablet by mouth daily.  . furosemide (LASIX) 20 MG tablet Take 1 tablet (20 mg total) by mouth daily.  Marland Kitchen ipratropium-albuterol (DUONEB) 0.5-2.5 (3) MG/3ML SOLN Take 3 mLs by nebulization every 6 (six) hours. (Patient taking differently: Take 3 mLs by nebulization every 6 (six) hours as needed (shortness of breath). )  . OXYGEN Inhale 2.5 L into the lungs continuous. Reported on 11/03/2015  . roflumilast (DALIRESP) 500 MCG TABS tablet Take 500 mcg by mouth daily.  . sertraline (ZOLOFT) 100 MG tablet Take 1 tablet (100 mg total) by mouth daily.  Marland Kitchen tiotropium (SPIRIVA) 18 MCG inhalation capsule Place 1 capsule (18  mcg total) into inhaler and inhale daily.   No current facility-administered medications on file prior to visit.    ROS  Constitutional: Afebrile, no chills, well hydrated and well nourished Gastrointestinal: negative Musculoskeletal:negative Neurological: negative Behavioral/Psych: negative  PFSH  Medical:  Ms. Defrancesco  has a past medical history of COPD (chronic obstructive pulmonary disease) ([place]); Hypertension; Gastritis; Lumbar radiculitis; Muscle weakness; DDD (degenerative disc disease), cervical; Depression; Chronic rhinitis; Chronic generalized abdominal pain; Anxiety; Edema; Carpal tunnel syndrome; Low back pain; Hematuria; Malodorous urine; Sensory urge incontinence; Obesity; Renal cyst; Tobacco abuse; and CHF (congestive heart failure) ([place]). Family: family history includes Coronary artery disease in her mother; Kidney cancer in her brother; Kidney disease in her mother; Lung cancer in her sister. There is no history of Prostate cancer. Surgical:  has past surgical history that includes Tubal ligation; Abdominal hysterectomy; Tonsillectomy; and Carpal tunnel release (Left). Tobacco:  reports that she has been smoking Cigarettes.  She has a 40 pack-year smoking history. She has never used smokeless tobacco. Alcohol:  reports that she does not drink alcohol. Drug:  reports that she does not use illicit drugs.  Physical Exam  Vitals:  Today's Vitals   12/28/15 0916 12/28/15 0919  BP: 117/73   Pulse: 77   Temp: 98.6 F (37 C)   Resp: 20   Height: _0  (1.575 m)   Weight: 260 lb (117.935 kg)   SpO2: 96%   PainSc: 10-Worst pain ever 10-Worst pain ever    Calculated BMI: Body mass index is 47.54 kg/(m^2). Extreme obesity (Class III) (>40 kg/m2) - 254% higher incidence of chronic pain  General appearance: alert, cooperative, appears stated age, mild distress, morbidly obese and on supplemental oxygen Eyes: PERLA Respiratory: No  evidence respiratory distress, no audible  rales or ronchi and no use of accessory muscles of respiration  Lumbar Spine Exam  Inspection: No gross anomalies detected Alignment: Symetrical Palpation: Tender  Provocative Tests:  Lumbar Hyperextension and rotation test:  Negative Patrick's Maneuver: Positive for hip joint pain, bilaterally  Gait Evaluation  Gait: Waddling (Hip pathology)  Lower Extremity Exam  Inspection: No gross anomalies detected ROM: Adequate Sensory:  Normal Motor: Unremarkable  Toe walk (S1): WNL  Heal walk (L5): WNL  Provocative Tests:  Straight Leg Raise (SLR) test: Lower extremity pain Angle: (+) between 30-70 degrees on the left side.  Assessment & Plan  Primary Diagnosis & Pertinent Problem List: The primary encounter diagnosis was Chronic pain. Diagnoses of Encounter for therapeutic drug level monitoring, Chronic pain of lower extremity, unspecified laterality, Chronic hip pain, unspecified laterality, Primary osteoarthritis of both hips, Osteoarthritis of spine with radiculopathy, lumbar region, and Obesity, Class III, BMI 40-49.9 (morbid obesity) ([place]) were also pertinent to this visit.  Visit Diagnosis: 1. Chronic pain   2. Encounter for therapeutic drug level monitoring   3. Chronic pain of lower extremity, unspecified laterality   4. Chronic hip pain, unspecified laterality   5. Primary osteoarthritis of both hips   6. Osteoarthritis of spine with radiculopathy, lumbar region   7. Obesity, Class III, BMI 40-49.9 (morbid obesity) ([facility])     Problem-specific Plan(s): No problem-specific assessment & plan notes found for this encounter.   Plan of Care  Pharmacotherapy (Medications Ordered): Meds ordered this encounter  Medications  . DISCONTD: HYDROcodone-acetaminophen (NORCO/VICODIN) 5-325 MG tablet    Sig: Take 1 tablet by mouth every 8 (eight) hours as needed for moderate pain.    Dispense:  90 tablet    Refill:  0    Do not place this medication, or any other prescription from  our practice, on "Automatic Refill". Patient may have prescription filled one day early if pharmacy is closed on scheduled refill date. Do not fill until: 01/02/16 To last until: 02/01/16  . HYDROcodone-acetaminophen (NORCO/VICODIN) 5-325 MG tablet    Sig: Take 1 tablet by mouth every 8 (eight) hours as needed for moderate pain.    Dispense:  90 tablet    Refill:  0    Do not place this medication, or any other prescription from our practice, on "Automatic Refill". Patient may have prescription filled one day early if pharmacy is closed on scheduled refill date. Do not fill until: 12/28/15 To last until: 01/27/16    [facility] & Procedure Ordered: Orders Placed This Encounter  Procedures  . DG HIP UNILAT W OR W/O PELVIS 2-3 VIEWS LEFT    Standing Status: Future     Number of Occurrences:      Standing Expiration Date: 12/27/2016    Order Specific Question:  Reason for Exam (SYMPTOM  OR DIAGNOSIS REQUIRED)    Answer:  Left hip pain/arthralgia    Order Specific Question:  Is the patient pregnant?    Answer:  No    Order Specific Question:  Preferred imaging location?    Answer:  [facility]    Order Specific Question:  Call Results- Best Contact Number?    Answer:  [phone] (Pain Clinic facility) (Dr. Dossie Arbour)  . DG HIP UNILAT W OR W/O PELVIS 2-3 VIEWS RIGHT    Standing Status: Future     Number of Occurrences:      Standing Expiration Date: 12/27/2016    Order Specific Question:  Reason for Exam (  SYMPTOM  OR DIAGNOSIS REQUIRED)    Answer:  Right hip pain/arthralgia    Order Specific Question:  Is the patient pregnant?    Answer:  No    Order Specific Question:  Preferred imaging location?    Answer:  [facility]    Order Specific Question:  Call Results- Best Contact Number?    Answer:  [phone] (Pain Clinic facility) (Dr. Dossie Arbour)  . MR Lumbar Spine Wo Contrast    Standing Status: Future     Number of Occurrences:      Standing Expiration Date: 12/27/2016     Scheduling Instructions:     Please provide canal diameter in millimeters when describing any spinal stenosis.    Order Specific Question:  Reason for Exam (SYMPTOM  OR DIAGNOSIS REQUIRED)    Answer:  Lumbar radiculopathy/radiculitis    Order Specific Question:  Preferred imaging location?    Answer:  [facility]    Order Specific Question:  Does the patient have a pacemaker or implanted devices?    Answer:  No    Order Specific Question:  What is the patient's sedation requirement?    Answer:  No Sedation    Order Specific Question:  Call Results- Best Contact Number?    Answer:  [phone] (Pain Clinic facility) (Dr. Dossie Arbour)  . Comprehensive metabolic panel    Standing Status: Future     Number of Occurrences:      Standing Expiration Date: 01/27/2016    Order Specific Question:  Has the patient fasted?    Answer:  No  . Amb ref to Medical Nutrition Therapy-[facility]    Referral Priority:  Routine    Referral Type:  Consultation    Referral Reason:  Specialty Services Required    Requested Specialty:  Nutrition    Number of Visits Requested:  1    Imaging Ordered: AMB REFERRAL TO MEDICAL NUTRITION THERAPY ([facility]) DG HIP UNILAT W OR W/O PELVIS 2-3 VIEWS LEFT DG HIP UNILAT W OR W/O PELVIS 2-3 VIEWS RIGHT MR LUMBAR SPINE WO CONTRAST  Interventional Therapies: Scheduled: None at this time PRN Procedures: None at this time.    Referral(s) or Consult(s): Nutritionist consult for weight loss.  Medications administered during this visit: Ms. Sellinger had no medications administered during this visit.  Future Appointments Date Time Provider [place]  01/12/2016 1:20 PM Milinda Pointer, MD [facility] None  02/03/2016 1:00 PM [name], FNP [facility] None    Primary Care Physician: [name], PA Location: [facility] Note by: Kathlen Brunswick. Dossie Arbour, M.D, DABA, DABAPM, DABPM, DABIPP, FIPP  Pain Score Disclaimer: We use the  NRS-11 scale. This is a self-reported, subjective measurement of pain severity with only modest accuracy. It is used primarily to identify changes within a particular patient. It must be understood that outpatient pain scales are significantly less accurate that those used for research, where they can be applied under ideal controlled circumstances with minimal exposure to variables. In reality, the score is likely to be a combination of pain intensity and pain affect, where pain affect describes the degree of emotional arousal or changes in action readiness caused by the sensory experience of pain. Factors such as social and work situation, setting, emotional state, anxiety levels, expectation, and prior pain experience may influence pain perception and show large inter-individual differences that may also be affected by time variables.

## 2015-12-28 NOTE — Patient Instructions (Signed)
Instructed to get bloodwork drawn right  [place] today in the medical mall.

## 2015-12-29 ENCOUNTER — Other Ambulatory Visit
Admission: RE | Admit: 2015-12-29 | Discharge: 2015-12-29 | Disposition: A | Discharge status: Home / Self Care | Payer: Medicare PPO | Source: Ambulatory Visit | Attending: Pain Medicine | Admitting: Pain Medicine

## 2015-12-29 ENCOUNTER — Ambulatory Visit
Admission: RE | Admit: 2015-12-29 | Discharge: 2015-12-29 | Disposition: A | Discharge status: Home / Self Care | Payer: Medicare PPO | Source: Ambulatory Visit | Attending: Pain Medicine | Admitting: Pain Medicine

## 2015-12-29 ENCOUNTER — Telehealth: Payer: Self-pay | Admitting: Pain Medicine

## 2015-12-29 DIAGNOSIS — G8929 Other chronic pain: Secondary | ICD-10-CM

## 2015-12-29 DIAGNOSIS — M25551 Pain in right hip: Secondary | ICD-10-CM | POA: Diagnosis not present

## 2015-12-29 DIAGNOSIS — M25552 Pain in left hip: Secondary | ICD-10-CM | POA: Diagnosis present

## 2015-12-29 DIAGNOSIS — M160 Bilateral primary osteoarthritis of hip: Secondary | ICD-10-CM | POA: Diagnosis present

## 2015-12-29 DIAGNOSIS — M25559 Pain in unspecified hip: Principal | ICD-10-CM

## 2015-12-29 LAB — COMPREHENSIVE METABOLIC PANEL
ALT: 28 U/L (ref 14–54)
AST: 27 U/L (ref 15–41)
Albumin: 3.9 g/dL (ref 3.5–5.0)
Alkaline Phosphatase: 82 U/L (ref 38–126)
Anion gap: 6 (ref 5–15)
BUN: 23 mg/dL — ABNORMAL HIGH (ref 6–20)
CO2: 31 mmol/L (ref 22–32)
Calcium: 9.9 mg/dL (ref 8.9–10.3)
Chloride: 101 mmol/L (ref 101–111)
Creatinine, Ser: 0.62 mg/dL (ref 0.44–1.00)
GFR calc Af Amer: >60 mL/min (ref >60)
GFR calc non Af Amer: >60 mL/min (ref >60)
Glucose, Bld: 102 mg/dL — ABNORMAL HIGH (ref 65–99)
Potassium: 4 mmol/L (ref 3.5–5.1)
Sodium: 138 mmol/L (ref 135–145)
Total Bilirubin: 0.4 mg/dL (ref 0.3–1.2)
Total Protein: 7.2 g/dL (ref 6.5–8.1)

## 2015-12-29 NOTE — Telephone Encounter (Signed)
Patient states Dr. Dossie Arbour said he was going to send in a script for Vitamin D, she checked at pharmacy and it wasn't there, please let her know when she can go pick it up

## 2015-12-30 ENCOUNTER — Other Ambulatory Visit: Payer: Self-pay | Admitting: Pain Medicine

## 2015-12-30 DIAGNOSIS — E559 Vitamin D deficiency, unspecified: Secondary | ICD-10-CM

## 2015-12-30 MED ORDER — VITAMIN D (ERGOCALCIFEROL) 1.25 MG (50000 UNIT) PO CAPS: ORAL_CAPSULE | ORAL | Status: DC

## 2015-12-30 MED ORDER — VITAMIN D3 50 MCG (2000 UT) PO CAPS: ORAL_CAPSULE | ORAL | Status: DC

## 2015-12-31 NOTE — Progress Notes (Signed)
Quick Note:   Normal fasting (NPO x 8 hours) glucose levels are between 65-99 mg/dl, with 2 hour fasting, levels are usually less than 140 mg/dl. Any random blood glucose level greater than 200 mg/dl is considered to be Diabetes.  BUN levels between 7 to 20 mg/dL (2.5 to 7.1 mmol/L) are considered normal. Elevated blood urea nitrogen can also be due to: urinary tract obstruction; congestive heart failure or recent heart attack; gastrointestinal bleeding; dehydration; shock; severe burns; certain medications, such as corticosteroids and some antibiotics; and/or a high protein diet.  BUN-to-creatinine ratio >20:1 (BUN dispropertionally higher than the creatinine levels) suggests prerenal azotemia (dehydration or renal hypoperfusion), while <10:1 levels suggest renal damage.  ______

## 2015-12-31 NOTE — Telephone Encounter (Signed)
Called patient and informed of vitamin D orders sent to pharmacy.

## 2016-01-09 DIAGNOSIS — M4726 Other spondylosis with radiculopathy, lumbar region: Secondary | ICD-10-CM | POA: Diagnosis not present

## 2016-01-09 DIAGNOSIS — J441 Chronic obstructive pulmonary disease with (acute) exacerbation: Secondary | ICD-10-CM | POA: Diagnosis not present

## 2016-01-09 DIAGNOSIS — I5032 Chronic diastolic (congestive) heart failure: Secondary | ICD-10-CM | POA: Diagnosis not present

## 2016-01-09 DIAGNOSIS — I110 Hypertensive heart disease with heart failure: Secondary | ICD-10-CM | POA: Diagnosis not present

## 2016-01-11 IMAGING — CR DG CHEST 1V
1 series · 1 of 1 positions shown · non-contrast
Comparison: 04/18/2015, 03/01/2015

CLINICAL DATA: Shortness of breath all day, worsened over the last
4 hours.

EXAM:
CHEST  1 VIEW

[portable]
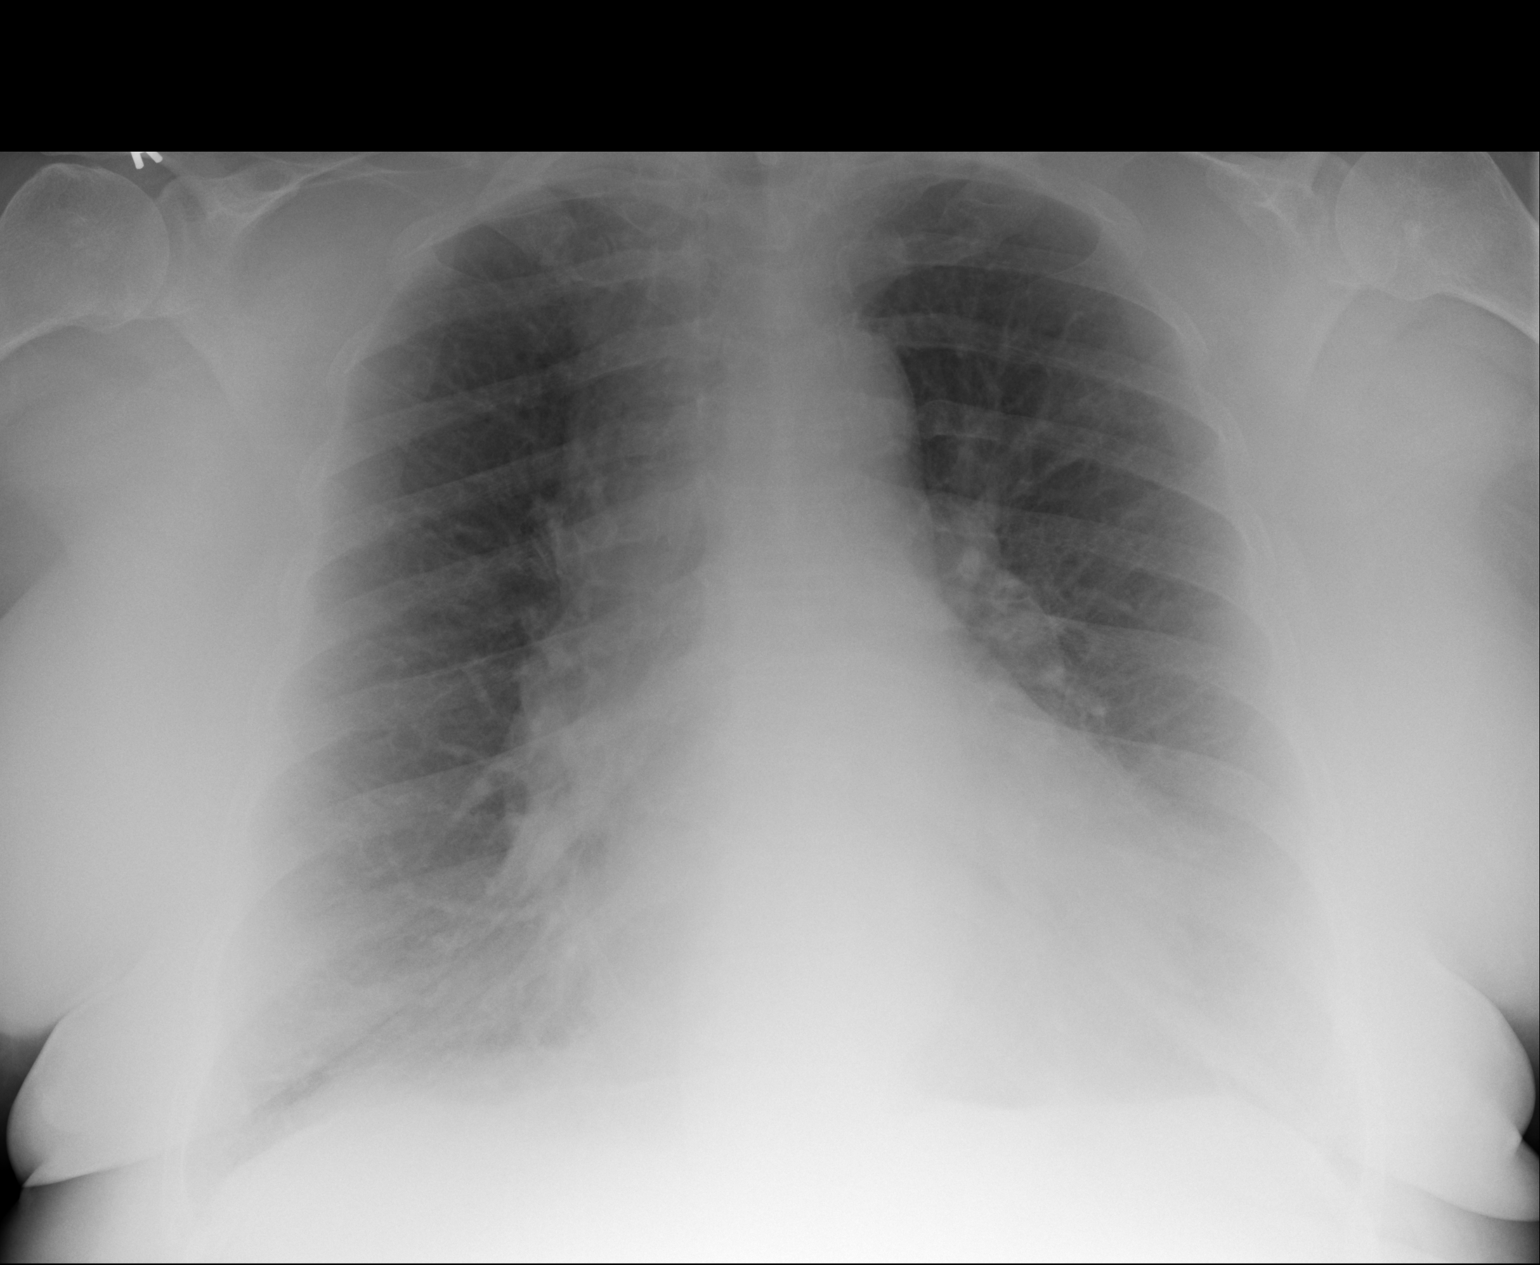

[1 of 1 positions shown; findings below may reference images not displayed]

FINDINGS: Heart is mildly enlarged. Ill-defined perivascular haziness and
interstitial thickening. No confluent airspace disease. The lungs
are hyperinflated. No large pleural effusion or pneumothorax.
Osseous structures are intact.
IMPRESSION: Perivascular haziness and interstitial thickening, suggestive of
pulmonary edema. Heart is mildly enlarged, may be accentuated by
technique. Recommend correlation for CHF.

## 2016-01-12 ENCOUNTER — Ambulatory Visit: Payer: Medicare PPO | Attending: Pain Medicine | Admitting: Pain Medicine

## 2016-01-12 ENCOUNTER — Encounter: Payer: Self-pay | Admitting: Pain Medicine

## 2016-01-12 VITALS — BP 93/68 | HR 86 | Temp 98.9°F | Resp 16 | Ht 62.0 in | Wt 264.0 lb

## 2016-01-12 DIAGNOSIS — M79605 Pain in left leg: Secondary | ICD-10-CM

## 2016-01-12 DIAGNOSIS — M545 Low back pain, unspecified: Secondary | ICD-10-CM

## 2016-01-12 DIAGNOSIS — J310 Chronic rhinitis: Secondary | ICD-10-CM | POA: Insufficient documentation

## 2016-01-12 DIAGNOSIS — J9611 Chronic respiratory failure with hypoxia: Secondary | ICD-10-CM | POA: Insufficient documentation

## 2016-01-12 DIAGNOSIS — M5412 Radiculopathy, cervical region: Secondary | ICD-10-CM | POA: Diagnosis not present

## 2016-01-12 DIAGNOSIS — M25551 Pain in right hip: Secondary | ICD-10-CM | POA: Insufficient documentation

## 2016-01-12 DIAGNOSIS — Z90710 Acquired absence of both cervix and uterus: Secondary | ICD-10-CM | POA: Insufficient documentation

## 2016-01-12 DIAGNOSIS — I10 Essential (primary) hypertension: Secondary | ICD-10-CM | POA: Diagnosis not present

## 2016-01-12 DIAGNOSIS — Z5181 Encounter for therapeutic drug level monitoring: Secondary | ICD-10-CM | POA: Diagnosis not present

## 2016-01-12 DIAGNOSIS — M5136 Other intervertebral disc degeneration, lumbar region: Secondary | ICD-10-CM | POA: Diagnosis not present

## 2016-01-12 DIAGNOSIS — F17210 Nicotine dependence, cigarettes, uncomplicated: Secondary | ICD-10-CM | POA: Insufficient documentation

## 2016-01-12 DIAGNOSIS — F329 Major depressive disorder, single episode, unspecified: Secondary | ICD-10-CM | POA: Insufficient documentation

## 2016-01-12 DIAGNOSIS — M5416 Radiculopathy, lumbar region: Secondary | ICD-10-CM | POA: Diagnosis not present

## 2016-01-12 DIAGNOSIS — M25552 Pain in left hip: Secondary | ICD-10-CM | POA: Insufficient documentation

## 2016-01-12 DIAGNOSIS — Z79899 Other long term (current) drug therapy: Secondary | ICD-10-CM | POA: Diagnosis not present

## 2016-01-12 DIAGNOSIS — R0689 Other abnormalities of breathing: Secondary | ICD-10-CM | POA: Insufficient documentation

## 2016-01-12 DIAGNOSIS — M25511 Pain in right shoulder: Secondary | ICD-10-CM | POA: Insufficient documentation

## 2016-01-12 DIAGNOSIS — J449 Chronic obstructive pulmonary disease, unspecified: Secondary | ICD-10-CM | POA: Insufficient documentation

## 2016-01-12 DIAGNOSIS — M549 Dorsalgia, unspecified: Secondary | ICD-10-CM | POA: Diagnosis present

## 2016-01-12 DIAGNOSIS — R079 Chest pain, unspecified: Secondary | ICD-10-CM | POA: Diagnosis not present

## 2016-01-12 DIAGNOSIS — I5032 Chronic diastolic (congestive) heart failure: Secondary | ICD-10-CM | POA: Insufficient documentation

## 2016-01-12 DIAGNOSIS — D688 Other specified coagulation defects: Secondary | ICD-10-CM | POA: Insufficient documentation

## 2016-01-12 DIAGNOSIS — M791 Myalgia: Secondary | ICD-10-CM | POA: Insufficient documentation

## 2016-01-12 DIAGNOSIS — G8929 Other chronic pain: Secondary | ICD-10-CM | POA: Diagnosis not present

## 2016-01-12 DIAGNOSIS — M533 Sacrococcygeal disorders, not elsewhere classified: Secondary | ICD-10-CM | POA: Insufficient documentation

## 2016-01-12 DIAGNOSIS — Z760 Encounter for issue of repeat prescription: Secondary | ICD-10-CM | POA: Diagnosis not present

## 2016-01-12 DIAGNOSIS — E559 Vitamin D deficiency, unspecified: Secondary | ICD-10-CM | POA: Insufficient documentation

## 2016-01-12 DIAGNOSIS — E872 Acidosis: Secondary | ICD-10-CM | POA: Diagnosis not present

## 2016-01-12 DIAGNOSIS — F419 Anxiety disorder, unspecified: Secondary | ICD-10-CM | POA: Insufficient documentation

## 2016-01-12 DIAGNOSIS — M79602 Pain in left arm: Secondary | ICD-10-CM

## 2016-01-12 DIAGNOSIS — Z6841 Body Mass Index (BMI) 40.0 and over, adult: Secondary | ICD-10-CM | POA: Insufficient documentation

## 2016-01-12 DIAGNOSIS — M5126 Other intervertebral disc displacement, lumbar region: Secondary | ICD-10-CM | POA: Diagnosis not present

## 2016-01-12 DIAGNOSIS — M47816 Spondylosis without myelopathy or radiculopathy, lumbar region: Secondary | ICD-10-CM | POA: Insufficient documentation

## 2016-01-12 DIAGNOSIS — Z79891 Long term (current) use of opiate analgesic: Secondary | ICD-10-CM | POA: Diagnosis not present

## 2016-01-12 DIAGNOSIS — G5600 Carpal tunnel syndrome, unspecified upper limb: Secondary | ICD-10-CM | POA: Diagnosis not present

## 2016-01-12 DIAGNOSIS — F1190 Opioid use, unspecified, uncomplicated: Secondary | ICD-10-CM | POA: Insufficient documentation

## 2016-01-12 MED ORDER — HYDROCODONE-ACETAMINOPHEN 5-325 MG PO TABS: 1.0000 | ORAL_TABLET | Freq: Three times a day (TID) | ORAL | Status: DC | PRN

## 2016-01-12 NOTE — Progress Notes (Signed)
Patient's Name: Theresa [name] MRN: [id number] DOB: Apr 03, 1956 DOS: 01/12/2016  Primary Reason(s) for Visit: Encounter for Medication Management CC: Back Pain   HPI  Theresa [name] is a 60 y.o. year old, female patient, who returns today as an established patient. She has Chronic respiratory acidosis; Hypoventilation, idiopathic; Nicotine addiction; Neuritis or radiculitis due to rupture of lumbar intervertebral disc; Decreased motor strength; Essential (primary) hypertension; Clinical depression; DDD (degenerative disc disease), lumbar; CAFL (chronic airflow limitation) ([place]); Chronic rhinitis; Carpal tunnel syndrome; Blood clotting disorder ([place]); Anxiety state; Chronic respiratory failure with hypoxia ([place]); Chronic diastolic heart failure ([place]); Chest pain; Tobacco abuse; Chronic pain; Encounter for therapeutic drug level monitoring; Encounter for chronic pain management; Pain management; Neurogenic pain; Neuropathic pain; Musculoskeletal pain; Lumbar spondylosis (L4-5); Chronic low back pain (Location of Primary Source of Pain) (Left); Chronic lower extremity pain (Location of Secondary source of pain) (Left); Chronic lumbar radicular pain (Location of Secondary source of pain) (S1 Dermatome) (Left); Chronic shoulder pain (Right side); Chronic upper extremity pain (Right-sided); Cervical radiculitis (Right side); Abnormal x-ray of lumbar spine; Chronic pain syndrome (significant psychosocial component); 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; Chronic obstructive pulmonary disease ([place]); Coagulation disorder ([place]); Chronic hip pain (Location of Primary Source of Pain) (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); 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); Obesity, Class III, BMI 40-49.9 (morbid obesity) ([place]); 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[place]); Vitamin D deficiency; Long term current use of opiate analgesic; Long term prescription opiate use; Opiate use; Lumbar facet syndrome (Location of Primary  Source of Pain) (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); and Chronic sacroiliac joint pain (Bilateral) on her problem list.. Her primarily concern today is the Back Pain   The patient returns to the clinics today for pharmacological management of her chronic pain. According to her the primary source of pain today is that of the lower back on the left side. The second area of most pain is the lower extremity on the left side with the pain going all the way down into the lateral aspect of the foot in what appears to be the S1 dermatomal distribution. Physical exam today was positive for bilateral lumbar facet pain on hyperextension and rotation with the left side being worst on the right. In addition, the patient was positive for right hip pain and bilateral sacroiliac joint pain on the Patrick maneuver.  Pain Assessment: Self-Reported Pain Score: 4  Reported level is compatible with observation Pain Type: Chronic pain Pain Location: Back Pain Orientation: Lower Pain Descriptors / Indicators: Aching, Constant, Radiating, Sharp (sharp pain sometimes) Pain Frequency: Constant  Date of Last Visit: 12/28/15 Service Provided on Last Visit: Med Refill (start medication of hydrocodone 5/325)  Controlled Substance Pharmacotherapy Assessment  Analgesic: Hydrocodone/APAP 5/325 one every 8 hours (15 mg/day) Pill Count: Pill count hydrocodone 5/388m Is 53/90 filled on 12/28/2015 MME/day: 15 mg/day Pharmacokinetics: Onset of action (Liberation/Absorption): Within expected pharmacological parameters. (20 minutes) Time to Peak effect (Distribution): Timing and results are as within normal expected parameters. (One hour) Duration of action (Metabolism/Excretion): Within normal limits for medication. (8-9 hours) Pharmacodynamics: Analgesic Effect: 60% Activity Facilitation: Medication(s) allow patient to sit, stand, walk, and do the basic ADLs Perceived Effectiveness: Described as relatively effective, allowing for increase in  activities of daily living (ADL) Side-effects or Adverse reactions: None reported Monitoring: [place] PMP: Online review of the past 156-montheriod conducted. Compliant with practice rules and regulations UDS Results/interpretation: Last UDS done on 09/16/2015 came back within normal limits with no medications. At that time, the patient had not started  the use of hydrocodone yet. Medication Assessment Form: Reviewed. Patient indicates being compliant with therapy Treatment compliance: Compliant Risk Assessment: Aberrant Behavior: None observed today Substance Use Disorder (SUD) Risk Level: Moderate Opioid Risk Tool (ORT) Score:  7 Moderate Risk for SUD (Score between 4-7) Depression Scale Score: PHQ-2: PHQ-2 Total Score: 0 No depression (0) PHQ-9: PHQ-9 Total Score: 0 No depression (0-4)  Pharmacologic Plan: No change in therapy, at this time   Laboratory Workup  Last ED UDS: No results found for: THCU, COCAINSCRNUR, PCPSCRNUR, MDMA, AMPHETMU, METHADONE, ETOH  Inflammation Markers Lab Results  Component Value Date   ESRSEDRATE 5 09/17/2015   CRP 1.1* 09/17/2015    Renal Function Lab Results  Component Value Date   BUN 23* 12/29/2015   CREATININE 0.62 12/29/2015   GFRAA >60 12/29/2015   GFRNONAA >60 12/29/2015    Hepatic Function Lab Results  Component Value Date   AST 27 12/29/2015   ALT 28 12/29/2015   ALBUMIN 3.9 12/29/2015    Electrolytes Lab Results  Component Value Date   NA 138 12/29/2015   K 4.0 12/29/2015   CL 101 12/29/2015   CALCIUM 9.9 12/29/2015   MG 2.1 09/17/2015    Allergies  Theresa [name] is allergic to codeine.  Meds  The patient has a current medication list which includes the following prescription(s): advair diskus, albuterol, alprazolam, aspirin-caffeine, bisoprolol-hydrochlorothiazide, furosemide, hydrocodone-acetaminophen, hydrocodone-acetaminophen, ipratropium-albuterol, oxygen-helium, roflumilast, sertraline, tiotropium, and vitamin d  (ergocalciferol).  Current Outpatient Prescriptions on File Prior to Visit  Medication Sig  . ADVAIR DISKUS 250-50 MCG/DOSE AEPB Inhale 1 puff into the lungs 2 (two) times daily.   Marland Kitchen albuterol (PROVENTIL HFA;VENTOLIN HFA) 108 (90 BASE) MCG/ACT inhaler Inhale 2 puffs into the lungs every 6 (six) hours as needed for wheezing. Reported on 11/03/2015  . ALPRAZolam (XANAX) 0.5 MG tablet Take 1 tablet (0.5 mg total) by mouth at bedtime as needed for anxiety. (Patient taking differently: Take 0.5 mg by mouth 2 (two) times daily as needed for anxiety. )  . Aspirin-Caffeine (BC FAST PAIN RELIEF ARTHRITIS) 1000-65 MG PACK Take 1 packet by mouth every 6 (six) hours as needed (Pain).  . bisoprolol-hydrochlorothiazide (ZIAC) 10-6.25 MG tablet TAKE 1 TABLET BY MOUTH DAILY  . furosemide (LASIX) 20 MG tablet Take 1 tablet (20 mg total) by mouth daily.  Marland Kitchen ipratropium-albuterol (DUONEB) 0.5-2.5 (3) MG/3ML SOLN Take 3 mLs by nebulization every 6 (six) hours. (Patient taking differently: Take 3 mLs by nebulization every 6 (six) hours as needed (shortness of breath). )  . OXYGEN Inhale 2.5 L into the lungs continuous. Reported on 11/03/2015  . roflumilast (DALIRESP) 500 MCG TABS tablet Take 500 mcg by mouth daily.  . sertraline (ZOLOFT) 100 MG tablet Take 1 tablet (100 mg total) by mouth daily.  Marland Kitchen tiotropium (SPIRIVA) 18 MCG inhalation capsule Place 1 capsule (18 mcg total) into inhaler and inhale daily.  . Vitamin D, Ergocalciferol, (DRISDOL) 50000 units CAPS capsule Take 1 capsule (50,000 Units total) by mouth 2 (two) times a week. X 6 weeks.   No current facility-administered medications on file prior to visit.    ROS  Constitutional: Afebrile, no chills, well hydrated and well nourished Gastrointestinal: negative Musculoskeletal:negative Neurological: negative Behavioral/Psych: negative  PFSH  Medical:  Theresa [name]  has a past medical history of COPD (chronic obstructive pulmonary disease) ([place]);  Hypertension; Gastritis; Lumbar radiculitis; Muscle weakness; DDD (degenerative disc disease), cervical; Depression; Chronic rhinitis; Chronic generalized abdominal pain; Anxiety; Edema; Carpal tunnel syndrome; Low back pain;  Hematuria; Malodorous urine; Sensory urge incontinence; Obesity; Renal cyst; Tobacco abuse; and CHF (congestive heart failure) ([place]). Family: family history includes Alcohol abuse in her father; Coronary artery disease in her mother; Heart disease in her father and mother; Lung cancer in her sister; Stroke in her mother. There is no history of Prostate cancer. Surgical:  has past surgical history that includes Tubal ligation; Abdominal hysterectomy; Tonsillectomy; and Carpal tunnel release (Left). Tobacco:  reports that she has been smoking Cigarettes.  She has a 40 pack-year smoking history. She has never used smokeless tobacco. Alcohol:  reports that she does not drink alcohol. Drug:  reports that she does not use illicit drugs.  Physical Exam  Vitals:  Today's Vitals   01/12/16 1333 01/12/16 1335  BP: 93/68   Pulse: 86   Temp: 98.9 F (37.2 C)   TempSrc: Oral   Resp: 16   Height: _0  (1.575 m)   Weight: 264 lb (119.75 kg)   SpO2: 96%   PainSc: 4  4   PainLoc: Back     Calculated BMI: Body mass index is 48.27 kg/(m^2). Extreme obesity (Class III) (>40 kg/m2) - 254% higher incidence of chronic pain  General appearance: alert, cooperative, appears stated age, no distress and morbidly obese Eyes: PERLA Respiratory: No evidence respiratory distress, no audible rales or ronchi and no use of accessory muscles of respiration  Lumbar Spine Inspection: No gross anomalies detected Alignment: Symetrical ROM: Decreased. Hyperextension and rotation was positive bilaterally for facet joint pain with the left being worst on the right. In addition, Patrick's maneuver was positive for right hip pain and bilateral sacroiliac joint pain.  Gait: Patient comes in today in a  wheel chair  Lower Extremities Inspection: No gross anomalies detected ROM: Adequate Sensory:  Normal Motor: Unremarkable  Assessment & Plan  Primary Diagnosis & Pertinent Problem List: The primary encounter diagnosis was Chronic pain. Diagnoses of Encounter for therapeutic drug level monitoring, Long term current use of opiate analgesic, Long term prescription opiate use, Opiate use, Chronic low back pain (Location of Primary Source of Pain) (Left), Chronic lumbar radicular pain (Location of Secondary source of pain) (S1 Dermatome) (Left), Chronic pain of lower extremity, left, Lumbar facet syndrome (Location of Primary Source of Pain) (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), and Chronic sacroiliac joint pain (Bilateral) were also pertinent to this visit.  Visit Diagnosis: 1. Chronic pain   2. Encounter for therapeutic drug level monitoring   3. Long term current use of opiate analgesic   4. Long term prescription opiate use   5. Opiate use   6. Chronic low back pain (Location of Primary Source of Pain) (Left)   7. Chronic lumbar radicular pain (Location of Secondary source of pain) (S1 Dermatome) (Left)   8. Chronic pain of lower extremity, left   9. Lumbar facet syndrome (Location of Primary Source of Pain) (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)   10. Chronic sacroiliac joint pain (Bilateral)     Problem-specific Plan(s): No problem-specific assessment & plan notes found for this encounter.   Plan of Care  Pharmacotherapy (Medications Ordered): Meds ordered this encounter  Medications  . HYDROcodone-acetaminophen (NORCO/VICODIN) 5-325 MG tablet    Sig: Take 1 tablet by mouth every 8 (eight) hours as needed for moderate pain.    Dispense:  90 tablet    Refill:  0    Do not place this medication, or any other prescription from our practice, on "Automatic Refill". Patient may have prescription filled one day early if pharmacy is closed on  scheduled refill date. Do not fill until: 01/27/16 To last until: 02/26/16     [facility] & Procedure Ordered: Orders Placed This Encounter  Procedures  . LUMBAR FACET(MEDIAL BRANCH NERVE BLOCK) MBNB    Standing Status: Future     Number of Occurrences:      Standing Expiration Date: 01/11/2017    Scheduling Instructions:     Side: Left-sided     Level: L2, L3, L4, L5, & S1 Medial Branch Nerve     Sedation: With Sedation.     Timeframe: ASAA    Order Specific Question:  Where will this procedure be performed?    Answer:  [facility]  . MR Lumbar Spine Wo Contrast    Standing Status: Future     Number of Occurrences:      Standing Expiration Date: 01/11/2017    Scheduling Instructions:     Please provide canal diameter in millimeters when describing any spinal stenosis.    Order Specific Question:  Reason for Exam (SYMPTOM  OR DIAGNOSIS REQUIRED)    Answer:  Lumbar radiculopathy/radiculitis    Order Specific Question:  Preferred imaging location?    Answer:  [facility]    Order Specific Question:  Does the patient have a pacemaker or implanted devices?    Answer:  No    Order Specific Question:  What is the patient's sedation requirement?    Answer:  No Sedation    Order Specific Question:  Call Results- Best Contact Number?    Answer:  [phone] (Pain Clinic facility) (Dr. Dossie Arbour)  . ToxASSURE Select 13 (MW), Urine    Volume: 30 ml(s). Minimum 3 ml of urine is needed. Document temperature of fresh sample. Indications: Long term (current) use of opiate analgesic (T55.027)    Imaging Ordered: MR LUMBAR SPINE WO CONTRAST  Interventional Therapies: Scheduled: Diagnostic left sided lumbar facet block under fluoroscopic guidance and IV sedation. PRN Procedures:  1. Diagnostic right intra-articular hip injection under fluoroscopic guidance, no sedation.  2. Diagnostic bilateral sacroiliac joint block under fluoroscopic guidance, no sedation.    Referral(s) or Consult(s): None at this time.  Medications administered during this  visit: Theresa [name] had no medications administered during this visit.  Future Appointments Date Time Provider [place]  02/03/2016 1:00 PM [name], [place] [facility] None    Primary Care Physician: [name], PA Location: [facility] Note by: Kathlen Brunswick. Dossie Arbour, M.D, DABA, DABAPM, DABPM, DABIPP, FIPP  Pain Score Disclaimer: We use the NRS-11 scale. This is a self-reported, subjective measurement of pain severity with only modest accuracy. It is used primarily to identify changes within a particular patient. It must be understood that outpatient pain scales are significantly less accurate that those used for research, where they can be applied under ideal controlled circumstances with minimal exposure to variables. In reality, the score is likely to be a combination of pain intensity and pain affect, where pain affect describes the degree of emotional arousal or changes in action readiness caused by the sensory experience of pain. Factors such as social and work situation, setting, emotional state, anxiety levels, expectation, and prior pain experience may influence pain perception and show large inter-individual differences that may also be affected by time variables.

## 2016-01-12 NOTE — Progress Notes (Signed)
Safety precautions to be maintained throughout the outpatient stay will include: orient to surroundings, keep bed in low position, maintain call bell within reach at all times, provide assistance with transfer out of bed and ambulation.  Pill count hydrocodone 5/358m  Is 53/90 filled on 12/28/2015

## 2016-01-12 NOTE — Patient Instructions (Signed)
Facet Blocks Patient Information  Description: The facets are joints in the spine between the vertebrae.  Like any joints in the body, facets can become irritated and painful.  Arthritis can also effect the facets.  By injecting steroids and local anesthetic in and around these joints, we can temporarily block the nerve supply to them.  Steroids act directly on irritated nerves and tissues to reduce selling and inflammation which often leads to decreased pain.  Facet blocks may be done anywhere along the spine from the neck to the low back depending upon the location of your pain.   After numbing the skin with local anesthetic (like Novocaine), a small needle is passed onto the facet joints under x-ray guidance.  You may experience a sensation of pressure while this is being done.  The entire block usually lasts about 15-25 minutes.   Conditions which may be treated by facet blocks:   Low back/buttock pain  Neck/shoulder pain  Certain types of headaches  Preparation for the injection:  1. Do not eat any solid food or dairy products within 8 hours of your appointment. 2. You may drink clear liquid up to 3 hours before appointment.  Clear liquids include water, black coffee, juice or soda.  No milk or cream please. 3. You may take your regular medication, including pain medications, with a sip of water before your appointment.  Diabetics should hold regular insulin (if taken separately) and take 1/2 normal NPH dose the morning of the procedure.  Carry some sugar containing items with you to your appointment. 4. A driver must accompany you and be prepared to drive you home after your procedure. 5. Bring all your current medications with you. 6. An IV may be inserted and sedation may be given at the discretion of the physician. 7. A blood pressure cuff, EKG and other monitors will often be applied during the procedure.  Some patients may need to have extra oxygen administered for a short  period. 8. You will be asked to provide medical information, including your allergies and medications, prior to the procedure.  We must know immediately if you are taking blood thinners (like Coumadin/Warfarin) or if you are allergic to IV iodine contrast (dye).  We must know if you could possible be pregnant.  Possible side-effects:   Bleeding from needle site  Infection (rare, may require surgery)  Nerve injury (rare)  Numbness & tingling (temporary)  Difficulty urinating (rare, temporary)  Spinal headache (a headache worse with upright posture)  Light-headedness (temporary)  Pain at injection site (serveral days)  Decreased blood pressure (rare, temporary)  Weakness in arm/leg (temporary)  Pressure sensation in back/neck (temporary)   Call if you experience:   Fever/chills associated with headache or increased back/neck pain  Headache worsened by an upright position  New onset, weakness or numbness of an extremity below the injection site  Hives or difficulty breathing (go to the emergency room)  Inflammation or drainage at the injection site(s)  Severe back/neck pain greater than usual  New symptoms which are concerning to you  Please note:  Although the local anesthetic injected can often make your back or neck feel good for several hours after the injection, the pain will likely return. It takes 3-7 days for steroids to work.  You may not notice any pain relief for at least one week.  If effective, we will often do a series of 2-3 injections spaced 3-6 weeks apart to maximally decrease your pain.  After the initial  series, you may be a candidate for a more permanent nerve block of the facets.  If you have any questions, please call #[phone]) [place] were given a prescription for Hydrocodone today.

## 2016-01-13 IMAGING — CR DG CHEST 2V
2 series · 2 of 2 positions shown · non-contrast
Comparison: June 29, 2015

CLINICAL DATA: Shortness of breath.

EXAM:
CHEST  2 VIEW

[chest pa]
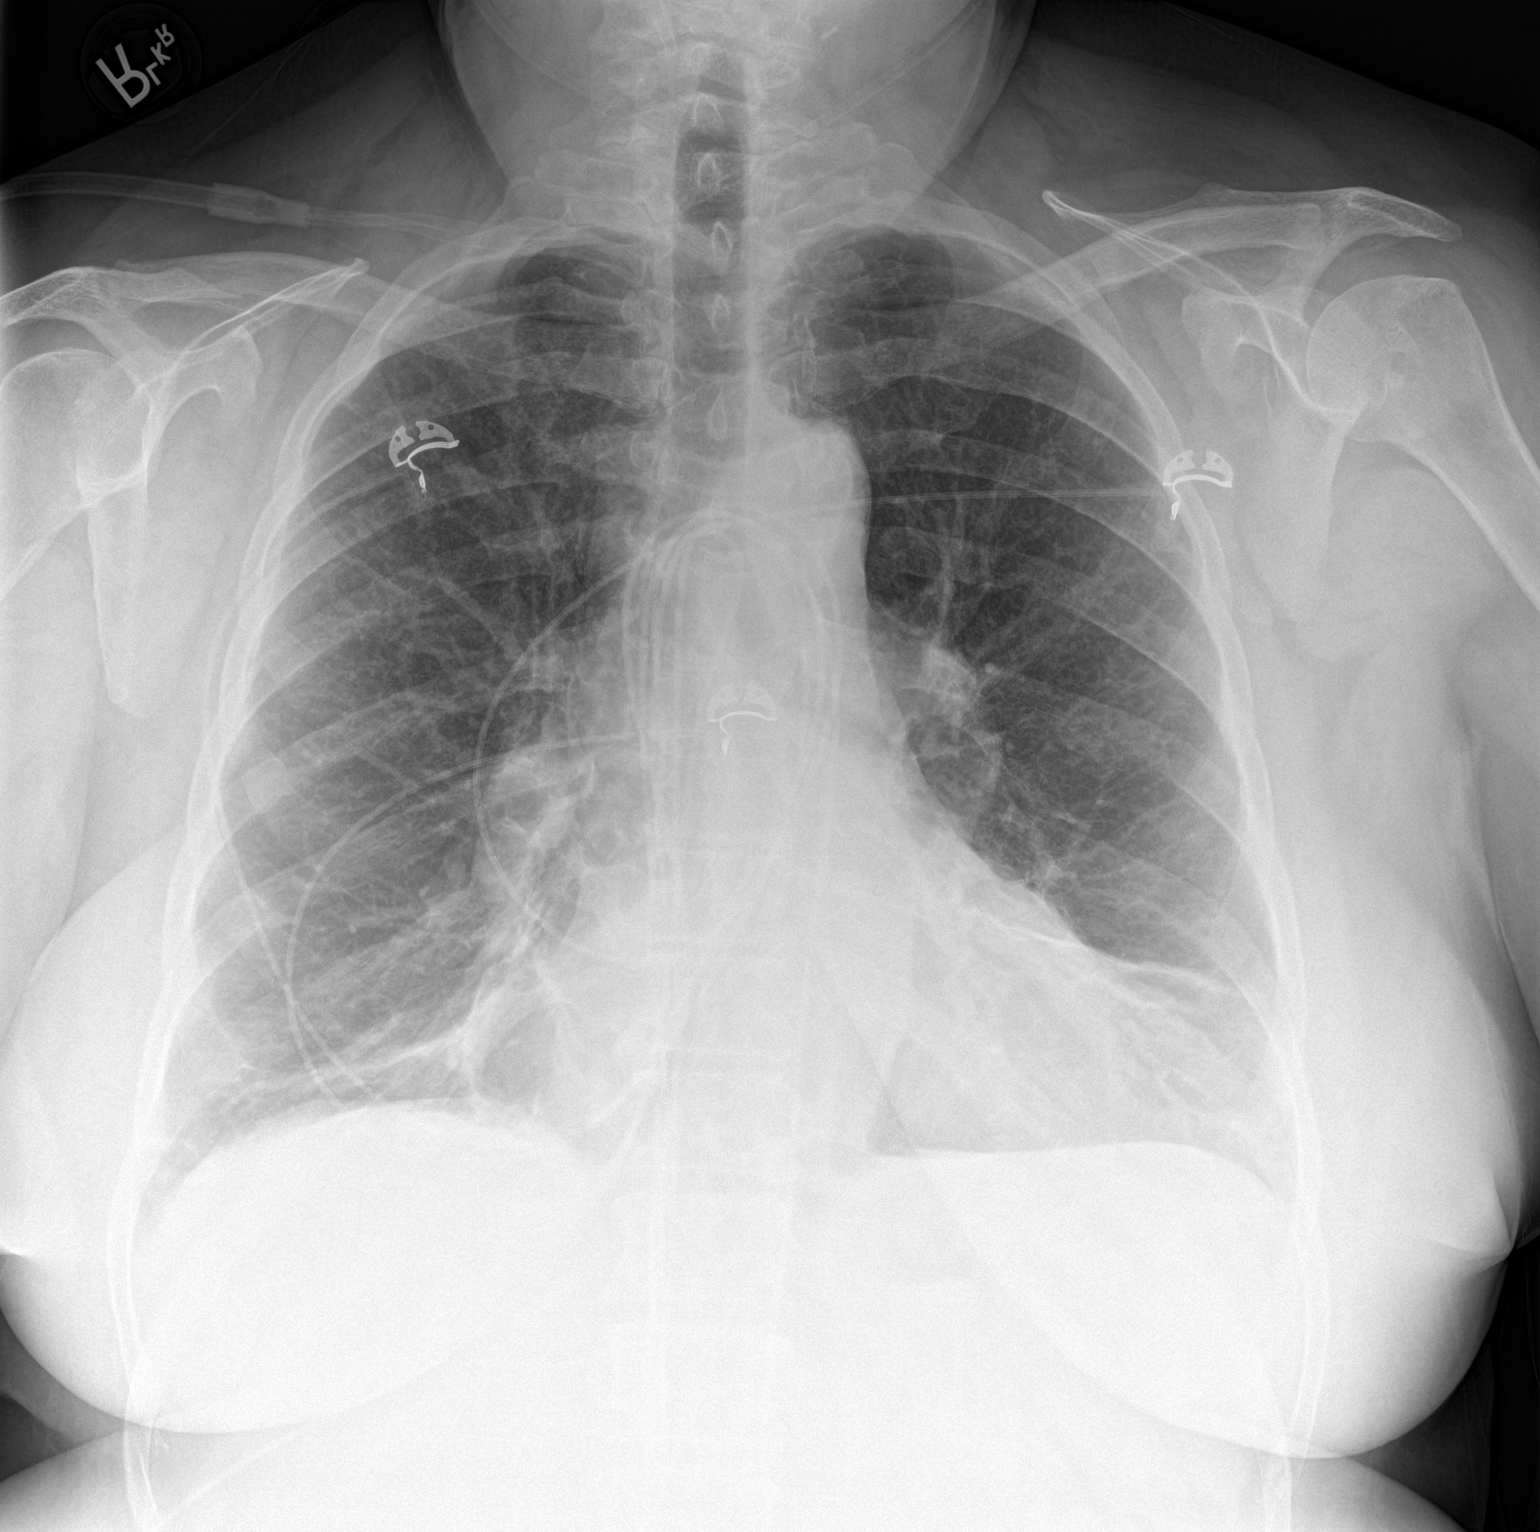

[chest lat]
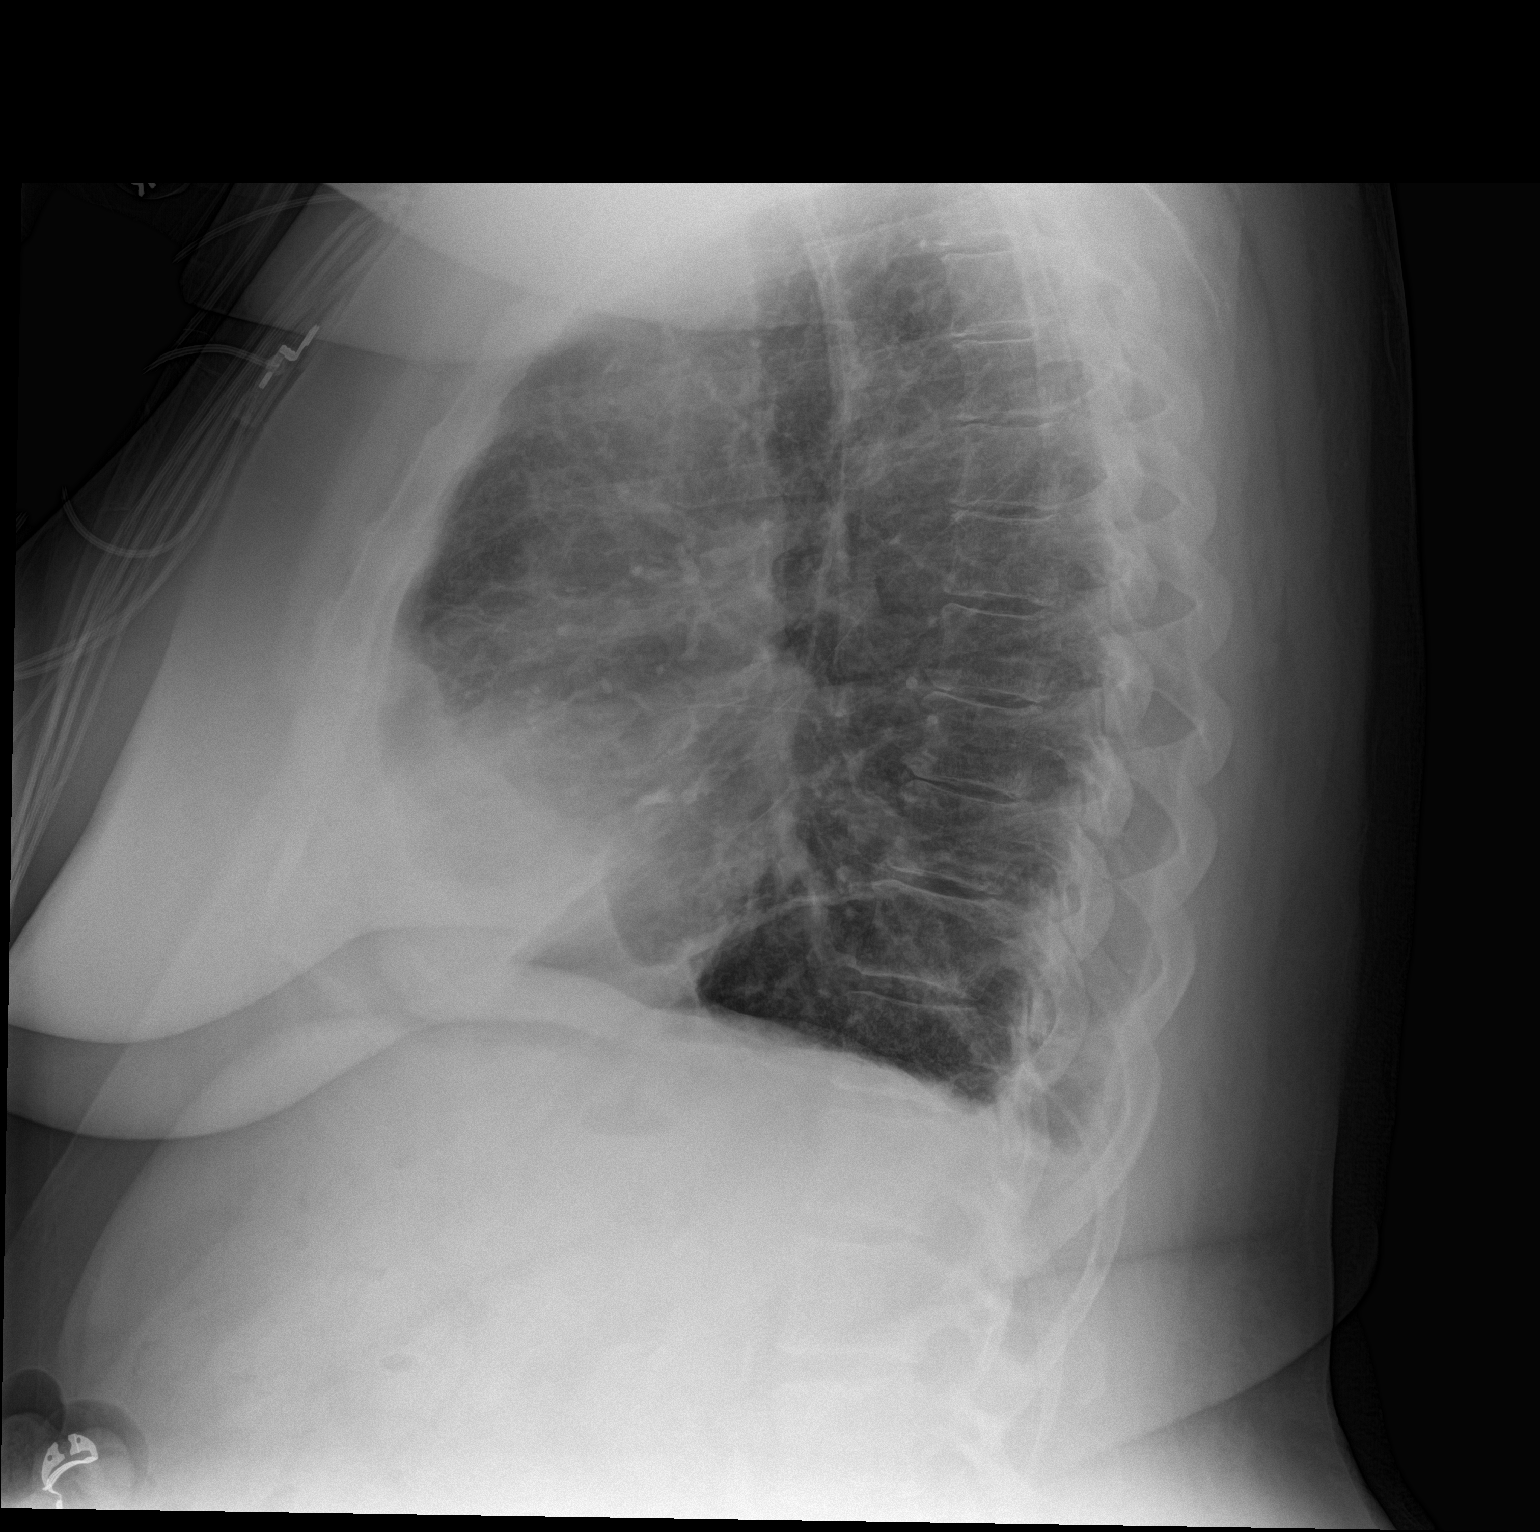

[2 of 2 positions shown; findings below may reference images not displayed]

FINDINGS: The heart size and mediastinal contours are stable. The heart size
is enlarged. There is interval developed patchy consolidation of
both lung bases at least in part due to atelectasis. Mild increased
pulmonary interstitium is identified bilaterally unchanged. The
visualized skeletal structures are stable.
IMPRESSION: Mild interstitial pulmonary edema unchanged compared to prior exam.

Interval developed patchy consolidation both lung bases at least in
part due to atelectasis but underlying pneumonia is not excluded.

## 2016-01-15 LAB — TOXASSURE SELECT 13 (MW), URINE: PDF: 0

## 2016-01-19 ENCOUNTER — Other Ambulatory Visit: Payer: Self-pay

## 2016-01-19 NOTE — Patient Outreach (Signed)
[facility]) Care Management  01/19/2016  Gordie Belvin 1955-12-29 [id number]   Telephone Screen  Referral Date: 01/14/16 Referral Source: [employer] PPO high risk list Referral Reason: needs to particiapte in [place] home or phone program or [place] attempt #1 to patient. No answer at present. RN CM left HIPAA compliant voicemail message along with contact info.   Plan: RN CM will make outreach attempt to patient within a week.  Enzo Montgomery, RN,BSN,CCM [place] [facility] Telephonic Care Management Coordinator Direct Phone: [phone] Toll Free: [phone] Fax: [phone]

## 2016-01-21 ENCOUNTER — Other Ambulatory Visit: Payer: Self-pay

## 2016-01-21 ENCOUNTER — Ambulatory Visit: Payer: Self-pay

## 2016-01-21 IMAGING — CR DG CHEST 1V PORT
1 series · 1 of 1 positions shown · non-contrast
Comparison: 07/01/2015

CLINICAL DATA: Right leg swelling for 2 weeks. Chest pain starting
today. History of CHF. Smoker.

EXAM:
PORTABLE CHEST 1 VIEW

[portable]
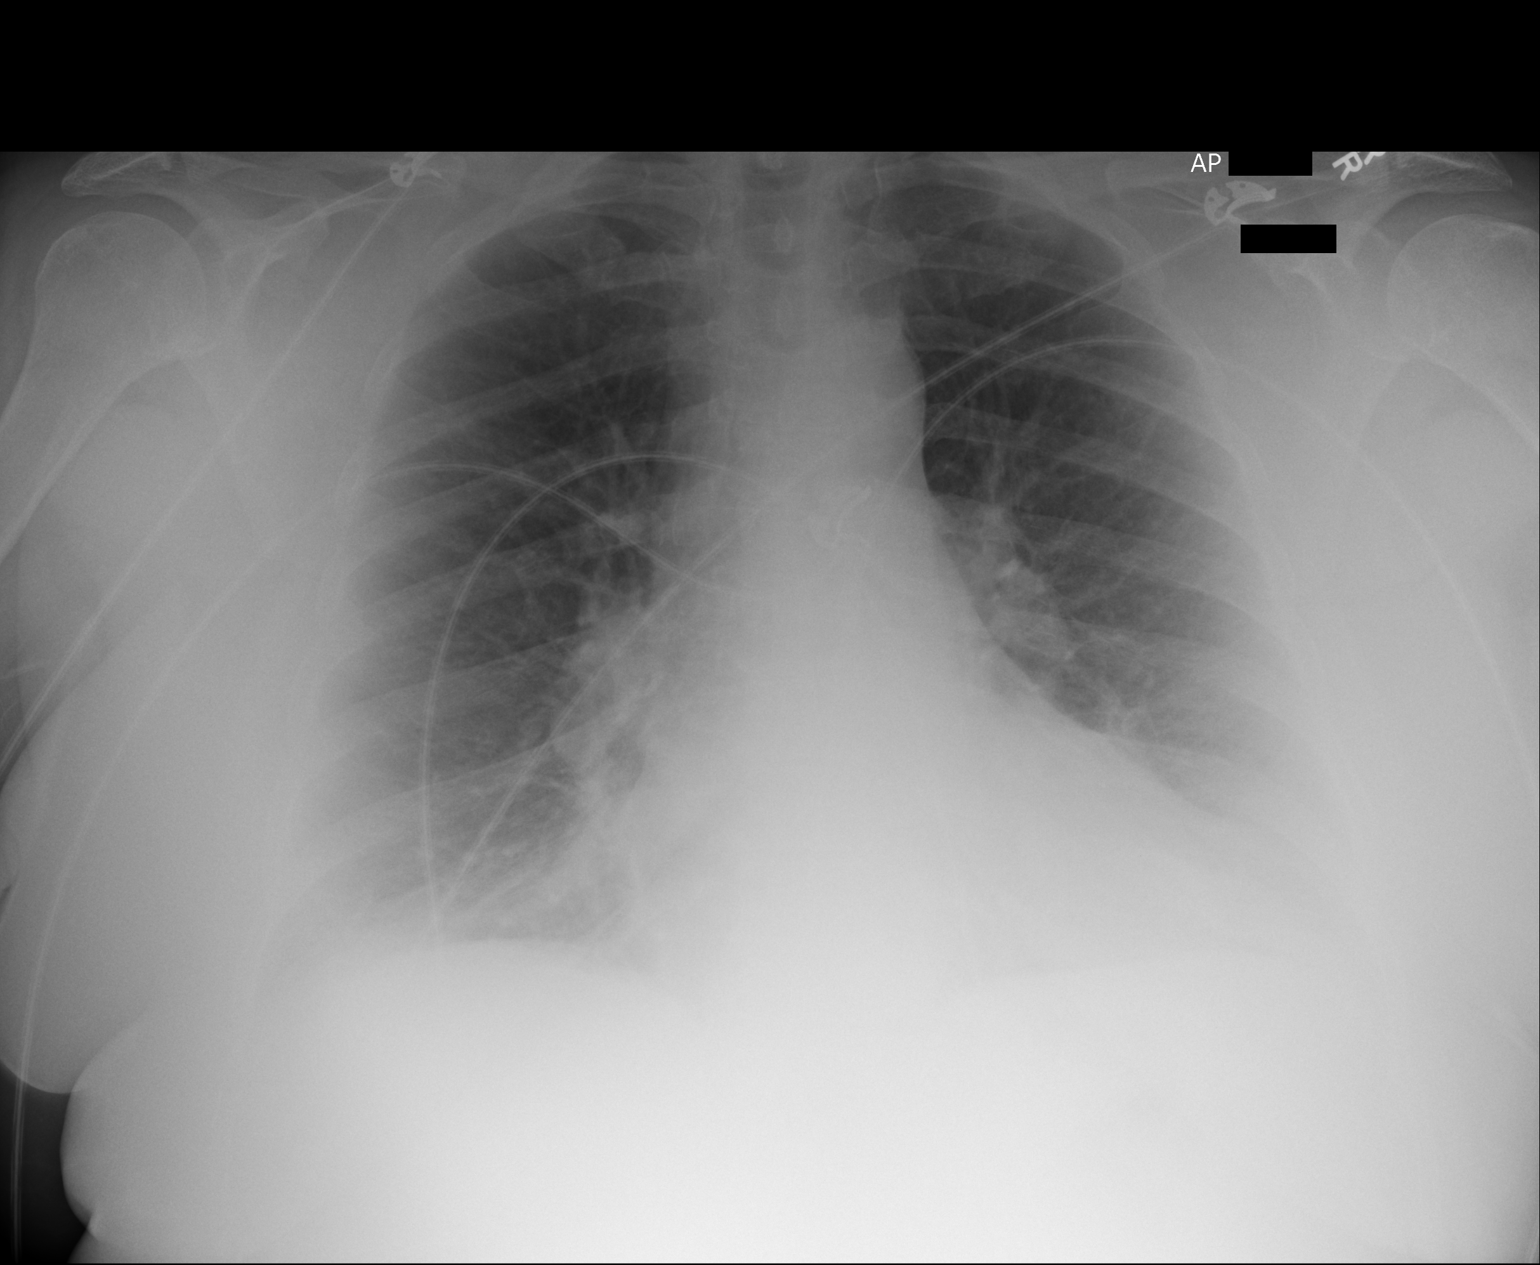

[1 of 1 positions shown; findings below may reference images not displayed]

FINDINGS: Borderline heart size and pulmonary vascularity is likely normal for
AP technique. Shallow inspiration with linear atelectasis in the
lung bases. No focal consolidation. No blunting of costophrenic
angles. No pneumothorax. Mediastinal contours appear intact.
IMPRESSION: Shallow inspiration with atelectasis in the lung bases.

## 2016-01-21 NOTE — Patient Outreach (Signed)
[place]) Care Management  01/21/2016  Theresa [name] Jul 22, 1956 [id number]   Telephone Screen  Referral Date: 01/14/16 Referral Source: [employer] PPO high risk list Referral Reason: needs to participate in [place] home or phone program or [place] attempt #2 to patient. No answer at present. RN CM left HIPAA compliant voicemail message along with contact info.   Plan: RN CM will make outreach attempt to patient within a week.   Enzo Montgomery, RN,BSN,CCM [place] [facility] Telephonic Care Management Coordinator Direct Phone: [phone] Toll Free: [phone] Fax: [phone]

## 2016-01-26 ENCOUNTER — Other Ambulatory Visit: Payer: Self-pay

## 2016-01-26 ENCOUNTER — Ambulatory Visit: Payer: Self-pay

## 2016-01-26 NOTE — Patient Outreach (Signed)
[place] [facility]) Care Management  01/26/2016  Theresa [name] 1955-12-13 [id number]   Telephone Screen  Referral Date: 01/14/16 Referral Source: [employer] PPO high risk list Referral Reason: needs to particiapte in [place] home or phone program or [place] attempt #3 to patient. No answer. RN CM left HIPAA compliant voicemail message along with contact info.   Plan: RN CM will send unsuccessful outreach letter to patient and close case if no response from patient within 10 business days.  Enzo Montgomery, RN,BSN,CCM [place] [facility] Telephonic Care Management Coordinator Direct Phone: [phone] Toll Free: [phone] Fax: [phone]

## 2016-01-28 ENCOUNTER — Encounter: Payer: Self-pay | Admitting: Pain Medicine

## 2016-01-28 ENCOUNTER — Ambulatory Visit: Payer: Medicare PPO | Attending: Pain Medicine | Admitting: Pain Medicine

## 2016-01-28 VITALS — BP 104/63 | HR 81 | Temp 97.8°F | Resp 20 | Ht 62.0 in | Wt 259.0 lb

## 2016-01-28 DIAGNOSIS — G5600 Carpal tunnel syndrome, unspecified upper limb: Secondary | ICD-10-CM | POA: Diagnosis not present

## 2016-01-28 DIAGNOSIS — I10 Essential (primary) hypertension: Secondary | ICD-10-CM | POA: Insufficient documentation

## 2016-01-28 DIAGNOSIS — M791 Myalgia: Secondary | ICD-10-CM | POA: Insufficient documentation

## 2016-01-28 DIAGNOSIS — F17200 Nicotine dependence, unspecified, uncomplicated: Secondary | ICD-10-CM | POA: Diagnosis not present

## 2016-01-28 DIAGNOSIS — F411 Generalized anxiety disorder: Secondary | ICD-10-CM | POA: Diagnosis not present

## 2016-01-28 DIAGNOSIS — E872 Acidosis: Secondary | ICD-10-CM | POA: Diagnosis not present

## 2016-01-28 DIAGNOSIS — M79605 Pain in left leg: Secondary | ICD-10-CM | POA: Insufficient documentation

## 2016-01-28 DIAGNOSIS — J310 Chronic rhinitis: Secondary | ICD-10-CM | POA: Insufficient documentation

## 2016-01-28 DIAGNOSIS — Z6841 Body Mass Index (BMI) 40.0 and over, adult: Secondary | ICD-10-CM | POA: Insufficient documentation

## 2016-01-28 DIAGNOSIS — M25551 Pain in right hip: Secondary | ICD-10-CM | POA: Insufficient documentation

## 2016-01-28 DIAGNOSIS — M79603 Pain in arm, unspecified: Secondary | ICD-10-CM | POA: Diagnosis present

## 2016-01-28 DIAGNOSIS — M545 Low back pain, unspecified: Secondary | ICD-10-CM

## 2016-01-28 DIAGNOSIS — J9611 Chronic respiratory failure with hypoxia: Secondary | ICD-10-CM | POA: Insufficient documentation

## 2016-01-28 DIAGNOSIS — M79601 Pain in right arm: Secondary | ICD-10-CM | POA: Diagnosis not present

## 2016-01-28 DIAGNOSIS — M25511 Pain in right shoulder: Secondary | ICD-10-CM | POA: Diagnosis not present

## 2016-01-28 DIAGNOSIS — J449 Chronic obstructive pulmonary disease, unspecified: Secondary | ICD-10-CM | POA: Insufficient documentation

## 2016-01-28 DIAGNOSIS — M79606 Pain in leg, unspecified: Secondary | ICD-10-CM | POA: Diagnosis present

## 2016-01-28 DIAGNOSIS — I5032 Chronic diastolic (congestive) heart failure: Secondary | ICD-10-CM | POA: Diagnosis not present

## 2016-01-28 DIAGNOSIS — E559 Vitamin D deficiency, unspecified: Secondary | ICD-10-CM | POA: Diagnosis not present

## 2016-01-28 DIAGNOSIS — M5136 Other intervertebral disc degeneration, lumbar region: Secondary | ICD-10-CM | POA: Insufficient documentation

## 2016-01-28 DIAGNOSIS — D7589 Other specified diseases of blood and blood-forming organs: Secondary | ICD-10-CM | POA: Diagnosis not present

## 2016-01-28 DIAGNOSIS — G8929 Other chronic pain: Secondary | ICD-10-CM | POA: Diagnosis not present

## 2016-01-28 DIAGNOSIS — F329 Major depressive disorder, single episode, unspecified: Secondary | ICD-10-CM | POA: Insufficient documentation

## 2016-01-28 DIAGNOSIS — M1610 Unilateral primary osteoarthritis, unspecified hip: Secondary | ICD-10-CM | POA: Insufficient documentation

## 2016-01-28 DIAGNOSIS — M5412 Radiculopathy, cervical region: Secondary | ICD-10-CM | POA: Insufficient documentation

## 2016-01-28 DIAGNOSIS — M25552 Pain in left hip: Secondary | ICD-10-CM | POA: Diagnosis not present

## 2016-01-28 DIAGNOSIS — M47816 Spondylosis without myelopathy or radiculopathy, lumbar region: Secondary | ICD-10-CM | POA: Diagnosis not present

## 2016-01-28 DIAGNOSIS — R292 Abnormal reflex: Secondary | ICD-10-CM | POA: Insufficient documentation

## 2016-01-28 DIAGNOSIS — R0689 Other abnormalities of breathing: Secondary | ICD-10-CM | POA: Diagnosis not present

## 2016-01-28 DIAGNOSIS — M549 Dorsalgia, unspecified: Secondary | ICD-10-CM | POA: Diagnosis present

## 2016-01-28 MED ORDER — FENTANYL CITRATE (PF) 100 MCG/2ML IJ SOLN
INTRAMUSCULAR | Status: AC
Start: 2016-01-28 — End: 2016-01-28
  Administered 2016-01-28: 1 ug via INTRAVENOUS
  Filled 2016-01-28: qty 2

## 2016-01-28 MED ORDER — TRIAMCINOLONE ACETONIDE 40 MG/ML IJ SUSP: 40.0000 mg | Freq: Once | INTRAMUSCULAR | Status: DC

## 2016-01-28 MED ORDER — MIDAZOLAM HCL 5 MG/5ML IJ SOLN
INTRAMUSCULAR | Status: AC
  Administered 2016-01-28: 1 mg via INTRAVENOUS
  Filled 2016-01-28: qty 5

## 2016-01-28 MED ORDER — FENTANYL CITRATE (PF) 100 MCG/2ML IJ SOLN: 100.0000 ug | INTRAMUSCULAR | Status: DC

## 2016-01-28 MED ORDER — ROPIVACAINE HCL 2 MG/ML IJ SOLN: 9.0000 mL | Freq: Once | INTRAMUSCULAR | Status: DC

## 2016-01-28 MED ORDER — ROPIVACAINE HCL 2 MG/ML IJ SOLN
INTRAMUSCULAR | Status: AC
  Administered 2016-01-28: 12:00:00
  Filled 2016-01-28: qty 10

## 2016-01-28 MED ORDER — LACTATED RINGERS IV SOLN: 1000.0000 mL | INTRAVENOUS | Status: AC

## 2016-01-28 MED ORDER — LIDOCAINE HCL (PF) 1 % IJ SOLN: 10.0000 mL | Freq: Once | INTRAMUSCULAR | Status: DC

## 2016-01-28 MED ORDER — TRIAMCINOLONE ACETONIDE 40 MG/ML IJ SUSP
INTRAMUSCULAR | Status: AC
  Administered 2016-01-28: 12:00:00
  Filled 2016-01-28: qty 1

## 2016-01-28 MED ORDER — MIDAZOLAM HCL 5 MG/5ML IJ SOLN: 5.0000 mg | INTRAMUSCULAR | Status: DC

## 2016-01-28 NOTE — Progress Notes (Signed)
Patient's Name: Theresa [name] Patient type: Established  MRN: [id number] Service setting: Ambulatory outpatient  DOB: 1956-09-27   DOS: 01/28/2016    Primary Reason(s) for Visit: Interventional Pain Management Treatment. CC: Back Pain; Leg Pain; and Arm Pain   Procedure:  Anesthesia, Analgesia, Anxiolysis:  Type: Diagnostic Medial Branch Facet Block Region: Lumbar Level: L2, L3, L4, L5, & S1 Medial Branch Level(s) Laterality: Left  Indications: 1. Lumbar spondylosis, unspecified spinal osteoarthritis   2. Lumbar facet syndrome (Location of Primary Source of Pain) (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)   3. Chronic low back pain (Location of Primary Source of Pain) (Left)     Pre-procedure Pain Score: 6/10 Reported level of pain is compatible with clinical observations Post-procedure Pain Score: 1   Type: Moderate (Conscious) Sedation & Local Anesthesia Local Anesthetic: Lidocaine 1% Route: Intravenous (IV) IV Access: Secured Sedation: Meaningful verbal contact was maintained at all times during the procedure  Indication(s): Analgesia & Anxiolysis   Pre-Procedure Assessment:  Theresa [name] is a 60 y.o. year old, female patient, seen today for interventional treatment. She has Chronic respiratory acidosis; Hypoventilation, idiopathic; Nicotine addiction; Neuritis or radiculitis due to rupture of lumbar intervertebral disc; Decreased motor strength; Essential (primary) hypertension; Clinical depression; DDD (degenerative disc disease), lumbar; CAFL (chronic airflow limitation) ([place]); Chronic rhinitis; Carpal tunnel syndrome; Blood clotting disorder ([place]); Anxiety state; Chronic respiratory failure with hypoxia ([place]); Chronic diastolic heart failure ([place]); Chest pain; Tobacco abuse; Chronic pain; Encounter for therapeutic drug level monitoring; Encounter for chronic pain management; Pain management; Neurogenic pain; Neuropathic pain; Musculoskeletal pain; Lumbar spondylosis (L4-5); Chronic low back pain (Location  of Primary Source of Pain) (Left); Chronic lower extremity pain (Location of Secondary source of pain) (Left); Chronic lumbar radicular pain (Location of Secondary source of pain) (S1 Dermatome) (Left); Chronic shoulder pain (Right side); Chronic upper extremity pain (Right-sided); Cervical radiculitis (Right side); Abnormal x-ray of lumbar spine; Chronic pain syndrome (significant psychosocial component); 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; Chronic obstructive pulmonary disease ([place]); Coagulation disorder ([place]); Chronic hip pain (Location of Primary Source of Pain) (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); 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 Her primarily concern today is the Back Pain; Leg Pain; and Arm Pain   Pain Type: Chronic pain Pain Location: Back (leg pain and arm pain) Pain Orientation: Lower Pain Descriptors / Indicators: Aching, Constant, Radiating, Sharp Pain Frequency: Constant  Date of Last Visit: 01/12/16 Service Provided on Last Visit: Med Refill  Verification of the correct person, correct site (including marking of site), and correct procedure were performed and confirmed by the patient.  Today's Vitals   01/28/16 1210 01/28/16 1220 01/28/16 1230 01/28/16 1240  BP: 103/56 108/74 105/90 104/63  Pulse: 72 78 76 81  Temp: 98.8 F (37.1 C)   97.8 F (36.6 C)  Resp: _0 Height:      Weight:      SpO2: 96% 95% 97% 94%  PainSc:    1   PainLoc:      Calculated BMI: Body mass index is 47.36 kg/(m^2). Allergies: She is allergic to codeine.. Primary Diagnosis: Lumbar spondylosis, unspecified spinal  osteoarthritis [M47.816]  Consent: Secured. Under the influence of no sedatives a written informed consent was  obtained, after having provided information on the risks and possible complications. To fulfill our ethical and legal obligations, as recommended by the American Medical Association's Code of Ethics, we have provided information to the patient about our clinical impression; the nature and purpose of the treatment or procedure; the risks, benefits, and possible complications of the intervention; alternatives; the risk(s) and benefit(s) of the alternative treatment(s) or procedure(s); and the risk(s) and benefit(s) of doing nothing. The patient was provided information about the risks and possible complications associated with the procedure. In the case of spinal procedures these may include, but are not limited to, failure to achieve desired goals, infection, bleeding, organ or nerve damage, allergic reactions, paralysis, and death. In addition, the patient was informed that Medicine is not an exact science; therefore, there is also the possibility of unforeseen risks and possible complications that may result in a catastrophic outcome. The patient indicated having understood very clearly. We have given the patient no guarantees and we have made no promises. Enough time was given to the patient to ask questions, all of which were answered to the patient's satisfaction.  Pre-Procedure Preparation: Safety Precautions: Allergies reviewed. Appropriate site, procedure, and patient were confirmed by following the Joint Commission's Universal Protocol (UP.01.01.01), in the form of a "Time Out". The patient was asked to confirm marked site and procedure, before commencing. The patient was asked about blood thinners, or active infections, both of which were denied. Patient was assessed for positional comfort and all pressure points were checked before starting procedure. Monitoring:  As per clinic  protocol. Infection Control Precautions: Sterile technique used. Standard Universal Precautions were taken as recommended by the Department of [facility] for Disease Control and Prevention (CDC). Standard pre-surgical skin prep was conducted. Respiratory hygiene and cough etiquette was practiced. Hand hygiene observed. Safe injection practices and needle disposal techniques followed. SDV (single dose vial) medications used. Medications properly checked for expiration dates and contaminants. Personal protective equipment (PPE) used: Sterile double glove technique. Radiation resistant gloves. Sterile surgical gloves.  Description of Procedure Process:   Time-out: "Time-out" completed before starting procedure, as per protocol. Position: Prone Target Area: For Lumbar Facet blocks, the target is the groove formed by the junction of the transverse process and superior articular process. For the L5 dorsal ramus, the target is the notch between superior articular process and sacral ala. For the S1 dorsal ramus, the target is the superior and lateral edge of the posterior S1 Sacral foramen. Approach: Paramedial approach. Area Prepped: Entire Posterior Lumbosacral Region Prepping solution: ChloraPrep (2% chlorhexidine gluconate and 70% isopropyl alcohol) Safety Precautions: Aspiration looking for blood return was conducted prior to all injections. At no point did we inject any substances, as a needle was being advanced. No attempts were made at seeking any paresthesias. Safe injection practices and needle disposal techniques used. Medications properly checked for expiration dates. SDV (single dose vial) medications used.   Description of the Procedure: Protocol guidelines were followed. The patient was placed in position over the fluoroscopy table. The target area was identified and the area prepped in the usual manner. Skin desensitized using vapocoolant spray. Skin & deeper tissues infiltrated with  local anesthetic. Appropriate amount of time allowed to pass for local anesthetics to take effect. The procedure needle was introduced through the skin, ipsilateral to the reported pain, and advanced to the target area. Employing the "Medial Branch Technique", the needles were advanced to the angle made by the superior and medial portion of the  transverse process, and the lateral and inferior portion of the superior articulating process of the targeted vertebral bodies. This area is known as "Burton's Eye" or the "Eye of the [place] Dog". A procedure needle was introduced through the skin, and this time advanced to the angle made by the superior and medial border of the sacral ala, and the lateral border of the S1 vertebral body. This last needle was later repositioned at the superior and lateral border of the posterior S1 foramen. Negative aspiration confirmed. Solution injected in intermittent fashion, asking for systemic symptoms every 0.5cc of injectate. The needles were then removed and the area cleansed, making sure to leave some of the prepping solution back to take advantage of its long term bactericidal properties. EBL: None Materials & Medications Used:  Needle(s) Used: 22g - 5" Spinal Needle(s) Medications Administered today: We administered ropivacaine (PF) 2 mg/ml (0.2%), triamcinolone acetonide, midazolam, and fentaNYL.Please see chart orders for dosing details.  Imaging Guidance:   Type of Imaging Technique: Fluoroscopy Guidance (Spinal) Indication(s): Assistance in needle guidance and placement for procedures requiring needle placement in or near specific anatomical locations not easily accessible without such assistance. Exposure Time: Please see nurses notes. Contrast: None required. Fluoroscopic Guidance: I was personally present in the fluoroscopy suite, where the patient was placed in position for the procedure, over the fluoroscopy-compatible table. Fluoroscopy was manipulated,  using "Tunnel Vision Technique", to obtain the best possible view of the target area, on the affected side. Parallax error was corrected before commencing the procedure. A "direction-depth-direction" technique was used to introduce the needle under continuous pulsed fluoroscopic guidance. Once the target was reached, antero-posterior, oblique, and lateral fluoroscopic projection views were taken to confirm needle placement in all planes. Permanently recorded images stored by scanning into EMR. Interpretation: Intraoperative imaging interpretation by performing Physician. Adequate needle placement confirmed. Adequate needle placement confirmed in AP, lateral, & Oblique Views. No contrast injected.  Antibiotic Prophylaxis:  Indication(s): No indications identified. Type:  Antibiotics Given (last 72 hours)    None       Post-operative Assessment:   Complications: No immediate post-treatment complications were observed. Disposition: Return to clinic for follow-up evaluation. The patient tolerated the entire procedure well. A repeat set of vitals were taken after the procedure and the patient was kept under observation following institutional policy, for this procedure. Post-procedural neurological assessment was performed, showing return to baseline, prior to discharge. The patient was discharged home, once institutional criteria were met. The patient was provided with post-procedure discharge instructions, including a section on how to identify potential problems. Should any problems arise concerning this procedure, the patient was given instructions to immediately contact us, at any time, without hesitation. In any case, we plan to contact the patient by telephone for a follow-up status report regarding this interventional procedure. Comments:  No additional relevant information.  Medications administered during this visit: We administered ropivacaine (PF) 2 mg/ml (0.2%), triamcinolone acetonide,  midazolam, and fentaNYL.  Prescriptions ordered during this visit: New Prescriptions   No medications on file    Future Appointments Date Time Provider [place]  02/03/2016 1:00 PM [name], FNP [facility] None  02/09/2016 1:20 PM Milinda Pointer, MD [facility] None    Primary Care Physician: [name], PA Location: [facility] Note by: Kathlen Brunswick. Dossie Arbour, M.D, DABA, DABAPM, DABPM, DABIPP, FIPP   Illustration of the posterior view of the lumbar spine and the posterior neural structures. Laminae of L2 through S1 are labeled. DPRL5,  dorsal primary ramus of L5; DPRS1, dorsal primary ramus of S1; DPR3, dorsal primary ramus of L3; FJ, facet (zygapophyseal) joint L3-L4; I, inferior articular process of L4; LB1, lateral branch of dorsal primary ramus of L1; IAB, inferior articular branches from L3 medial branch (supplies L4-L5 facet joint); IBP, intermediate branch plexus; MB3, medial branch of dorsal primary ramus of L3; NR3, third lumbar nerve root; S, superior articular process of L5; SAB, superior articular branches from L4 (supplies L4-5 facet joint also); TP3, transverse process of L3.  Disclaimer:  Medicine is not an Chief Strategy Officer. The only guarantee in medicine is that nothing is guaranteed. It is important to note that the decision to proceed with this intervention was based on the information collected from the patient. The Data and conclusions were drawn from the patient's questionnaire, the interview, and the physical examination. Because the information was provided in large part by the patient, it cannot be guaranteed that it has not been purposely or unconsciously manipulated. Every effort has been made to obtain as much relevant data as possible for this evaluation. It is important to note that the conclusions that lead to this procedure are derived in large part from the available data. Always take into account that the treatment will  also be dependent on availability of resources and existing treatment guidelines, considered by other Pain Management Practitioners as being common knowledge and practice, at the time of the intervention. For Medico-Legal purposes, it is also important to point out that variation in procedural techniques and pharmacological choices are the acceptable norm. The indications, contraindications, technique, and results of the above procedure should only be interpreted and judged by a Board-Certified Interventional Pain Specialist with extensive familiarity and expertise in the same exact procedure and technique. Attempts at providing opinions without similar or greater experience and expertise than that of the treating physician will be considered as inappropriate and unethical, and shall result in a formal complaint to the state medical board and applicable specialty societies.

## 2016-01-28 NOTE — Progress Notes (Signed)
Safety precautions to be maintained throughout the outpatient stay will include: orient to surroundings, keep bed in low position, maintain call bell within reach at all times, provide assistance with transfer out of bed and ambulation.

## 2016-01-28 NOTE — Patient Instructions (Addendum)
Smoking Cessation, Tips for Success If you are ready to quit smoking, congratulations! You have chosen to help yourself be healthier. Cigarettes bring nicotine, tar, carbon monoxide, and other irritants into your body. Your lungs, heart, and blood vessels will be able to work better without these poisons. There are many different ways to quit smoking. Nicotine gum, nicotine patches, a nicotine inhaler, or nicotine nasal spray can help with physical craving. Hypnosis, support groups, and medicines help break the habit of smoking. WHAT THINGS CAN I DO TO MAKE QUITTING EASIER?  Here are some tips to help you quit for good: 1. Pick a date when you will quit smoking completely. Tell all of your friends and family about your plan to quit on that date. 2. Do not try to slowly cut down on the number of cigarettes you are smoking. Pick a quit date and quit smoking completely starting on that day. 3. Throw away all cigarettes.  4. Clean and remove all ashtrays from your home, work, and car. 5. On a card, write down your reasons for quitting. Carry the card with you and read it when you get the urge to smoke. 6. Cleanse your body of nicotine. Drink enough water and fluids to keep your urine clear or pale yellow. Do this after quitting to flush the nicotine from your body. 7. Learn to predict your moods. Do not let a bad situation be your excuse to have a cigarette. Some situations in your life might tempt you into wanting a cigarette. 8. Never have "just one" cigarette. It leads to wanting another and another. Remind yourself of your decision to quit. 9. Change habits associated with smoking. If you smoked while driving or when feeling stressed, try other activities to replace smoking. Stand up when drinking your coffee. Brush your teeth after eating. Sit in a different chair when you read the paper. Avoid alcohol while trying to quit, and try to drink fewer caffeinated beverages. Alcohol and caffeine may urge you  to smoke. 10. Avoid foods and drinks that can trigger a desire to smoke, such as sugary or spicy foods and alcohol. 11. Ask people who smoke not to smoke around you. 12. Have something planned to do right after eating or having a cup of coffee. For example, plan to take a walk or exercise. 13. Try a relaxation exercise to calm you down and decrease your stress. Remember, you may be tense and nervous for the first 2 weeks after you quit, but this will pass. 15. Find new activities to keep your hands busy. Play with a pen, coin, or rubber band. Doodle or draw things on paper. 15. Brush your teeth right after eating. This will help cut down on the craving for the taste of tobacco after meals. You can also try mouthwash.  16. Use oral substitutes in place of cigarettes. Try using lemon drops, carrots, cinnamon sticks, or chewing gum. Keep them handy so they are available when you have the urge to smoke. 17. When you have the urge to smoke, try deep breathing. 56. Designate your home as a nonsmoking area. 19. If you are a heavy smoker, ask your health care provider about a prescription for nicotine chewing gum. It can ease your withdrawal from nicotine. 20. Reward yourself. Set aside the cigarette money you save and buy yourself something nice. 21. Look for support from others. Join a support group or smoking cessation program. Ask someone at home or at work to help you with your plan  to quit smoking. 22. Always ask yourself, "Do I need this cigarette or is this just a reflex?" Tell yourself, "Today, I choose not to smoke," or "I do not want to smoke." You are reminding yourself of your decision to quit. 23. Do not replace cigarette smoking with electronic cigarettes (commonly called e-cigarettes). The safety of e-cigarettes is unknown, and some may contain harmful chemicals. 24. If you relapse, do not give up! Plan ahead and think about what you will do the next time you get the urge to smoke. HOW WILL  I FEEL WHEN I QUIT SMOKING? You may have symptoms of withdrawal because your body is used to nicotine (the addictive substance in cigarettes). You may crave cigarettes, be irritable, feel very hungry, cough often, get headaches, or have difficulty concentrating. The withdrawal symptoms are only temporary. They are strongest when you first quit but will go away within 10-14 days. When withdrawal symptoms occur, stay in control. Think about your reasons for quitting. Remind yourself that these are signs that your body is healing and getting used to being without cigarettes. Remember that withdrawal symptoms are easier to treat than the major diseases that smoking can cause.  Even after the withdrawal is over, expect periodic urges to smoke. However, these cravings are generally short lived and will go away whether you smoke or not. Do not smoke! WHAT RESOURCES ARE AVAILABLE TO HELP ME QUIT SMOKING? Your health care provider can direct you to community resources or hospitals for support, which may include:  Group support.  Education.  Hypnosis.  Therapy.   This information is not intended to replace advice given to you by your health care provider. Make sure you discuss any questions you have with your health care provider.   Document Released: 07/01/2004 Document Revised: 10/24/2014 Document Reviewed: 03/21/2013 Elsevier Interactive Patient Education 2016 [employer] Pain Management Discharge Instructions  General Discharge Instructions :  If you need to reach your doctor call: Monday-Friday 8:00 am - 4:00 pm at [phone] or toll free [phone].  After clinic hours [phone] to have operator reach doctor.  Bring all of your medication bottles to all your appointments in the pain clinic.  To cancel or reschedule your appointment with Pain Management please remember to call 24 hours in advance to avoid a fee.  Refer to the educational materials which you have been given on:  General Risks, I had my Procedure. Discharge Instructions, Post Sedation.  Post Procedure Instructions:  The drugs you were given will stay in your system until tomorrow, so for the next 24 hours you should not drive, make any legal decisions or drink any alcoholic beverages.  You may eat anything you prefer, but it is better to start with liquids then soups and crackers, and gradually work up to solid foods.  Please notify your doctor immediately if you have any unusual bleeding, trouble breathing or pain that is not related to your normal pain.  Depending on the type of procedure that was done, some parts of your body may feel week and/or numb.  This usually clears up by tonight or the next day.  Walk with the use of an assistive device or accompanied by an adult for the 24 hours.  You may use ice on the affected area for the first 24 hours.  Put ice in a Ziploc bag and cover with a towel and place against area 15 minutes on 15 minutes off.  You may switch to heat after 24 hours.GENERAL RISKS AND  COMPLICATIONS  What are the risk, side effects and possible complications? Generally speaking, most procedures are safe.  However, with any procedure there are risks, side effects, and the possibility of complications.  The risks and complications are dependent upon the sites that are lesioned, or the type of nerve block to be performed.  The closer the procedure is to the spine, the more serious the risks are.  Great care is taken when placing the radio frequency needles, block needles or lesioning probes, but sometimes complications can occur. 1. Infection: Any time there is an injection through the skin, there is a risk of infection.  This is why sterile conditions are used for these blocks.  There are four possible types of infection. 1. Localized skin infection. 2. Central Nervous System Infection-This can be in the form of Meningitis, which can be deadly. 3. Epidural Infections-This can be in  the form of an epidural abscess, which can cause pressure inside of the spine, causing compression of the spinal cord with subsequent paralysis. This would require an emergency surgery to decompress, and there are no guarantees that the patient would recover from the paralysis. 4. Discitis-This is an infection of the intervertebral discs.  It occurs in about 1% of discography procedures.  It is difficult to treat and it may lead to surgery.        2. Pain: the needles have to go through skin and soft tissues, will cause soreness.       3. Damage to internal structures:  The nerves to be lesioned may be near blood vessels or    other nerves which can be potentially damaged.       4. Bleeding: Bleeding is more common if the patient is taking blood thinners such as  aspirin, Coumadin, Ticiid, Plavix, etc., or if he/she have some genetic predisposition  such as hemophilia. Bleeding into the spinal canal can cause compression of the spinal  cord with subsequent paralysis.  This would require an emergency surgery to  decompress and there are no guarantees that the patient would recover from the  paralysis.       5. Pneumothorax:  Puncturing of a lung is a possibility, every time a needle is introduced in  the area of the chest or upper back.  Pneumothorax refers to free air around the  collapsed lung(s), inside of the thoracic cavity (chest cavity).  Another two possible  complications related to a similar event would include: Hemothorax and Chylothorax.   These are variations of the Pneumothorax, where instead of air around the collapsed  lung(s), you may have blood or chyle, respectively.       6. Spinal headaches: They may occur with any procedures in the area of the spine.       7. Persistent CSF (Cerebro-Spinal Fluid) leakage: This is a rare problem, but may occur  with prolonged intrathecal or epidural catheters either due to the formation of a fistulous  track or a dural tear.       8. Nerve damage: By  working so close to the spinal cord, there is always a possibility of  nerve damage, which could be as serious as a permanent spinal cord injury with  paralysis.       9. Death:  Although rare, severe deadly allergic reactions known as "Anaphylactic  reaction" can occur to any of the medications used.      10. Worsening of the symptoms:  We can always make thing worse.  What  are the chances of something like this happening? Chances of any of this occuring are extremely low.  By statistics, you have more of a chance of getting killed in a motor vehicle accident: while driving to the hospital than any of the above occurring .  Nevertheless, you should be aware that they are possibilities.  In general, it is similar to taking a shower.  Everybody knows that you can slip, hit your head and get killed.  Does that mean that you should not shower again?  Nevertheless always keep in mind that statistics do not mean anything if you happen to be on the wrong side of them.  Even if a procedure has a 1 (one) in a 1,000,000 (million) chance of going wrong, it you happen to be that one..Also, keep in mind that by statistics, you have more of a chance of having something go wrong when taking medications.  Who should not have this procedure? If you are on a blood thinning medication (e.g. Coumadin, Plavix, see list of "Blood Thinners"), or if you have an active infection going on, you should not have the procedure.  If you are taking any blood thinners, please inform your physician.  How should I prepare for this procedure?  Do not eat or drink anything at least six hours prior to the procedure.  Bring a driver with you .  It cannot be a taxi.  Come accompanied by an adult that can drive you back, and that is strong enough to help you if your legs get weak or numb from the local anesthetic.  Take all of your medicines the morning of the procedure with just enough water to swallow them.  If you have diabetes,  make sure that you are scheduled to have your procedure done first thing in the morning, whenever possible.  If you have diabetes, take only half of your insulin dose and notify our nurse that you have done so as soon as you arrive at the clinic.  If you are diabetic, but only take blood sugar pills (oral hypoglycemic), then do not take them on the morning of your procedure.  You may take them after you have had the procedure.  Do not take aspirin or any aspirin-containing medications, at least eleven (11) days prior to the procedure.  They may prolong bleeding.  Wear loose fitting clothing that may be easy to take off and that you would not mind if it got stained with Betadine or blood.  Do not wear any jewelry or perfume  Remove any nail coloring.  It will interfere with some of our monitoring equipment.  NOTE: Remember that this is not meant to be interpreted as a complete list of all possible complications.  Unforeseen problems may occur.  BLOOD THINNERS The following drugs contain aspirin or other products, which can cause increased bleeding during surgery and should not be taken for 2 weeks prior to and 1 week after surgery.  If you should need take something for relief of minor pain, you may take acetaminophen which is found in Tylenol,m Datril, Anacin-3 and Panadol. It is not blood thinner. The products listed below are.  Do not take any of the products listed below in addition to any listed on your instruction sheet.  A.P.C or A.P.C with Codeine Codeine Phosphate Capsules #3 Ibuprofen Ridaura  ABC compound Congesprin Imuran rimadil  Advil Cope Indocin Robaxisal  Alka-Seltzer Effervescent Pain Reliever and Antacid Coricidin or Coricidin-D  Indomethacin Rufen  Alka-Seltzer plus  Cold Medicine Cosprin Ketoprofen S-A-C Tablets  Anacin Analgesic Tablets or Capsules Coumadin Korlgesic Salflex  Anacin Extra Strength Analgesic tablets or capsules CP-2 Tablets Lanoril Salicylate  Anaprox  Cuprimine Capsules Levenox Salocol  Anexsia-D Dalteparin Magan Salsalate  Anodynos Darvon compound Magnesium Salicylate Sine-off  Ansaid Dasin Capsules Magsal Sodium Salicylate  Anturane Depen Capsules Marnal Soma  APF Arthritis pain formula Dewitt's Pills Measurin Stanback  Argesic Dia-Gesic Meclofenamic Sulfinpyrazone  Arthritis Bayer Timed Release Aspirin Diclofenac Meclomen Sulindac  Arthritis pain formula Anacin Dicumarol Medipren Supac  Analgesic (Safety coated) Arthralgen Diffunasal Mefanamic Suprofen  Arthritis Strength Bufferin Dihydrocodeine Mepro Compound Suprol  Arthropan liquid Dopirydamole Methcarbomol with Aspirin Synalgos  ASA tablets/Enseals Disalcid Micrainin Tagament  Ascriptin Doan's Midol Talwin  Ascriptin A/D Dolene Mobidin Tanderil  Ascriptin Extra Strength Dolobid Moblgesic Ticlid  Ascriptin with Codeine Doloprin or Doloprin with Codeine Momentum Tolectin  Asperbuf Duoprin Mono-gesic Trendar  Aspergum Duradyne Motrin or Motrin IB Triminicin  Aspirin plain, buffered or enteric coated Durasal Myochrisine Trigesic  Aspirin Suppositories Easprin Nalfon Trillsate  Aspirin with Codeine Ecotrin Regular or Extra Strength Naprosyn Uracel  Atromid-S Efficin Naproxen Ursinus  Auranofin Capsules Elmiron Neocylate Vanquish  Axotal Emagrin Norgesic Verin  Azathioprine Empirin or Empirin with Codeine Normiflo Vitamin E  Azolid Emprazil Nuprin Voltaren  Bayer Aspirin plain, buffered or children's or timed BC Tablets or powders Encaprin Orgaran Warfarin Sodium  Buff-a-Comp Enoxaparin Orudis Zorpin  Buff-a-Comp with Codeine Equegesic Os-Cal-Gesic   Buffaprin Excedrin plain, buffered or Extra Strength Oxalid   Bufferin Arthritis Strength Feldene Oxphenbutazone   Bufferin plain or Extra Strength Feldene Capsules Oxycodone with Aspirin   Bufferin with Codeine Fenoprofen Fenoprofen Pabalate or Pabalate-SF   Buffets II Flogesic Panagesic   Buffinol plain or Extra Strength Florinal  or Florinal with Codeine Panwarfarin   Buf-Tabs Flurbiprofen Penicillamine   Butalbital Compound Four-way cold tablets Penicillin   Butazolidin Fragmin Pepto-Bismol   Carbenicillin Geminisyn Percodan   Carna Arthritis Reliever Geopen Persantine   Carprofen Gold's salt Persistin   Chloramphenicol Goody's Phenylbutazone   Chloromycetin Haltrain Piroxlcam   Clmetidine heparin Plaquenil   Cllnoril Hyco-pap Ponstel   Clofibrate Hydroxy chloroquine Propoxyphen         Before stopping any of these medications, be sure to consult the physician who ordered them.  Some, such as Coumadin (Warfarin) are ordered to prevent or treat serious conditions such as "deep thrombosis", "pumonary embolisms", and other heart problems.  The amount of time that you may need off of the medication may also vary with the medication and the reason for which you were taking it.  If you are taking any of these medications, please make sure you notify your pain physician before you undergo any procedures.

## 2016-02-01 ENCOUNTER — Other Ambulatory Visit: Payer: Self-pay

## 2016-02-01 NOTE — Patient Outreach (Signed)
[facility]) Care Management  02/01/2016  Theresa [name] 06/09/1956 [id number]   Telephone Screen  Referral Date: 01/14/16 Referral Source: [employer] P[employer] high risk list Referral Reason: needs to particiapte in [facility] home or phone program or [place] message received from patient. Return call placed to patient. No answer at present. RN CM left HIPAA compliant voicemail message along with contact info.    Plan: RN CM has already sent letter to patient. Will await response from patient and if no response then close case out per guidelines.   Enzo Montgomery, RN,BSN,CCM [facility] Telephonic Care Management Coordinator Direct Phone: [phone] Toll Free: [phone] Fax: [phone]

## 2016-02-02 ENCOUNTER — Telehealth: Payer: Self-pay | Admitting: *Deleted

## 2016-02-02 NOTE — Telephone Encounter (Signed)
Voicemail left for patient to call if there are questions or concerns re; procedure on 01/28/16

## 2016-02-03 ENCOUNTER — Encounter: Payer: Self-pay | Admitting: Family

## 2016-02-03 ENCOUNTER — Ambulatory Visit: Payer: Medicare PPO | Attending: Family | Admitting: Family

## 2016-02-03 VITALS — BP 123/78 | HR 77 | Resp 20 | Ht 62.0 in | Wt 254.0 lb

## 2016-02-03 DIAGNOSIS — I110 Hypertensive heart disease with heart failure: Secondary | ICD-10-CM | POA: Insufficient documentation

## 2016-02-03 DIAGNOSIS — R5383 Other fatigue: Secondary | ICD-10-CM | POA: Insufficient documentation

## 2016-02-03 DIAGNOSIS — Z811 Family history of alcohol abuse and dependence: Secondary | ICD-10-CM | POA: Insufficient documentation

## 2016-02-03 DIAGNOSIS — M5030 Other cervical disc degeneration, unspecified cervical region: Secondary | ICD-10-CM | POA: Diagnosis not present

## 2016-02-03 DIAGNOSIS — Z720 Tobacco use: Secondary | ICD-10-CM

## 2016-02-03 DIAGNOSIS — R0782 Intercostal pain: Secondary | ICD-10-CM

## 2016-02-03 DIAGNOSIS — Z90710 Acquired absence of both cervix and uterus: Secondary | ICD-10-CM | POA: Diagnosis not present

## 2016-02-03 DIAGNOSIS — J449 Chronic obstructive pulmonary disease, unspecified: Secondary | ICD-10-CM | POA: Diagnosis not present

## 2016-02-03 DIAGNOSIS — Z885 Allergy status to narcotic agent status: Secondary | ICD-10-CM | POA: Diagnosis not present

## 2016-02-03 DIAGNOSIS — Z7982 Long term (current) use of aspirin: Secondary | ICD-10-CM | POA: Diagnosis not present

## 2016-02-03 DIAGNOSIS — F17210 Nicotine dependence, cigarettes, uncomplicated: Secondary | ICD-10-CM | POA: Insufficient documentation

## 2016-02-03 DIAGNOSIS — Z7951 Long term (current) use of inhaled steroids: Secondary | ICD-10-CM | POA: Diagnosis not present

## 2016-02-03 DIAGNOSIS — Z8249 Family history of ischemic heart disease and other diseases of the circulatory system: Secondary | ICD-10-CM | POA: Insufficient documentation

## 2016-02-03 DIAGNOSIS — R110 Nausea: Secondary | ICD-10-CM | POA: Insufficient documentation

## 2016-02-03 DIAGNOSIS — R1084 Generalized abdominal pain: Secondary | ICD-10-CM | POA: Diagnosis not present

## 2016-02-03 DIAGNOSIS — N281 Cyst of kidney, acquired: Secondary | ICD-10-CM | POA: Diagnosis not present

## 2016-02-03 DIAGNOSIS — F329 Major depressive disorder, single episode, unspecified: Secondary | ICD-10-CM | POA: Insufficient documentation

## 2016-02-03 DIAGNOSIS — E669 Obesity, unspecified: Secondary | ICD-10-CM | POA: Insufficient documentation

## 2016-02-03 DIAGNOSIS — R072 Precordial pain: Secondary | ICD-10-CM | POA: Insufficient documentation

## 2016-02-03 DIAGNOSIS — Z8042 Family history of malignant neoplasm of prostate: Secondary | ICD-10-CM | POA: Insufficient documentation

## 2016-02-03 DIAGNOSIS — F419 Anxiety disorder, unspecified: Secondary | ICD-10-CM | POA: Insufficient documentation

## 2016-02-03 DIAGNOSIS — Z716 Tobacco abuse counseling: Secondary | ICD-10-CM | POA: Diagnosis not present

## 2016-02-03 DIAGNOSIS — Z79899 Other long term (current) drug therapy: Secondary | ICD-10-CM | POA: Insufficient documentation

## 2016-02-03 DIAGNOSIS — R05 Cough: Secondary | ICD-10-CM | POA: Diagnosis not present

## 2016-02-03 DIAGNOSIS — Z8673 Personal history of transient ischemic attack (TIA), and cerebral infarction without residual deficits: Secondary | ICD-10-CM | POA: Diagnosis not present

## 2016-02-03 DIAGNOSIS — I5032 Chronic diastolic (congestive) heart failure: Secondary | ICD-10-CM | POA: Diagnosis not present

## 2016-02-03 DIAGNOSIS — I10 Essential (primary) hypertension: Secondary | ICD-10-CM

## 2016-02-03 DIAGNOSIS — G5600 Carpal tunnel syndrome, unspecified upper limb: Secondary | ICD-10-CM | POA: Insufficient documentation

## 2016-02-03 DIAGNOSIS — Z801 Family history of malignant neoplasm of trachea, bronchus and lung: Secondary | ICD-10-CM | POA: Insufficient documentation

## 2016-02-03 DIAGNOSIS — J45909 Unspecified asthma, uncomplicated: Secondary | ICD-10-CM | POA: Diagnosis not present

## 2016-02-03 DIAGNOSIS — J410 Simple chronic bronchitis: Secondary | ICD-10-CM

## 2016-02-03 DIAGNOSIS — R0602 Shortness of breath: Secondary | ICD-10-CM | POA: Insufficient documentation

## 2016-02-03 DIAGNOSIS — Z823 Family history of stroke: Secondary | ICD-10-CM | POA: Diagnosis not present

## 2016-02-03 NOTE — Patient Instructions (Signed)
Continue weighing daily and call for an overnight weight gain of > 2 pounds or a weekly weight gain of >5 pounds. 

## 2016-02-03 NOTE — Progress Notes (Signed)
Subjective:    Patient ID: Theresa [name], female    DOB: 1956-08-27, 60 y.o.   MRN: [id number]  Congestive Heart Failure Presents for follow-up visit. The disease course has been improving. Associated symptoms include chest pain, fatigue and shortness of breath. Pertinent negatives include no abdominal pain, edema, muscle weakness, orthopnea or palpitations. The symptoms have been improving. Past treatments include beta blockers, oxygen and salt and fluid restriction. The treatment provided moderate relief. Compliance with prior treatments has been good. Her past medical history is significant for chronic lung disease and HTN. There is no history of arrhythmia, CVA or DM. She has multiple 1st degree relatives with heart disease.  Chest Pain  This is a recurrent problem. The current episode started in the past 7 days. The onset quality is sudden. The problem occurs intermittently. The pain is present in the substernal region. The pain is at a severity of 3/10. The pain is mild. The quality of the pain is described as dull. The pain radiates to the right arm and right shoulder. Associated symptoms include back pain, a cough, nausea and shortness of breath. Pertinent negatives include no abdominal pain, diaphoresis, dizziness, headaches, irregular heartbeat, palpitations or vomiting. The pain is aggravated by nothing. She has tried rest for the symptoms. The treatment provided mild relief. Risk factors include lack of exercise, obesity, sedentary lifestyle, smoking/tobacco exposure and stress.  Her past medical history is significant for anxiety/panic attacks, CHF, HTN, hypertension and strokes.  Pertinent negatives for past medical history include no arrhythmia, no diabetes, no DM, no MI and no muscle weakness.  Her family medical history is significant for CAD.  Pertinent negatives for family medical history include: no diabetes.    Past Medical History  Diagnosis Date  . COPD (chronic obstructive  pulmonary disease) ([place])   . Hypertension   . Gastritis   . Lumbar radiculitis   . Muscle weakness   . DDD (degenerative disc disease), cervical   . Depression   . Chronic rhinitis   . Chronic generalized abdominal pain   . Anxiety   . Edema   . Carpal tunnel syndrome   . Low back pain   . Hematuria   . Malodorous urine   . Sensory urge incontinence   . Obesity   . Renal cyst   . Tobacco abuse   . CHF (congestive heart failure) [facility])     Past Surgical History  Procedure Laterality Date  . Tubal ligation    . Abdominal hysterectomy    . Tonsillectomy    . Carpal tunnel release Left     Family History  Problem Relation Age of Onset  . Heart disease Mother   . Stroke Mother   . Coronary artery disease Mother   . Prostate cancer Neg Hx   . Lung cancer Sister   . Alcohol abuse Father   . Heart disease Father     Social History  Substance Use Topics  . Smoking status: Current Every Day Smoker -- 1.00 packs/day for 40 years    Types: Cigarettes  . Smokeless tobacco: Never Used     Comment: 30+ years, pt states she has cut back to 4-5 cigarettes a day  . Alcohol Use: No    Allergies  Allergen Reactions  . Codeine Nausea And Vomiting    Prior to Admission medications   Medication Sig Start Date End Date Taking? Authorizing Provider  ADVAIR DISKUS 250-50 MCG/DOSE AEPB Inhale 1 puff into the lungs 2 (two)  times daily.  09/13/15  Yes Historical Provider, MD  albuterol (PROVENTIL HFA;VENTOLIN HFA) 108 (90 BASE) MCG/ACT inhaler Inhale 2 puffs into the lungs every 6 (six) hours as needed for wheezing. Reported on 11/03/2015   Yes Historical Provider, MD  ALPRAZolam Duanne Moron) 0.5 MG tablet Take 1 tablet (0.5 mg total) by mouth at bedtime as needed for anxiety. Patient taking differently: Take 0.5 mg by mouth 2 (two) times daily as needed for anxiety.  07/04/15  Yes Fritzi Mandes, MD  Aspirin-Caffeine (BC FAST PAIN RELIEF ARTHRITIS) 1000-65 MG PACK Take 1 packet by mouth every 6  (six) hours as needed (Pain).   Yes Historical Provider, MD  bisoprolol-hydrochlorothiazide [facility]) 10-6.25 MG tablet TAKE 1 TABLET BY MOUTH DAILY 12/08/15  Yes Vickki Muff Chrismon, PA  furosemide (LASIX) 20 MG tablet Take 1 tablet (20 mg total) by mouth daily. 07/24/15  Yes [name], FNP  HYDROcodone-acetaminophen (NORCO/VICODIN) 5-325 MG tablet Take by mouth. 12/28/15  Yes Historical Provider, MD  ipratropium-albuterol (DUONEB) 0.5-2.5 (3) MG/3ML SOLN Take 3 mLs by nebulization every 6 (six) hours. Patient taking differently: Take 3 mLs by nebulization every 6 (six) hours as needed (shortness of breath).  04/21/15  Yes Srikar Sudini, MD  OXYGEN Inhale 2.5 L into the lungs continuous. Reported on 11/03/2015   Yes Historical Provider, MD  roflumilast (DALIRESP) 500 MCG TABS tablet Take 500 mcg by mouth daily.   Yes Historical Provider, MD  sertraline (ZOLOFT) 100 MG tablet Take 1 tablet (100 mg total) by mouth daily. 12/07/15  Yes Dennis E Chrismon, PA  tiotropium (SPIRIVA) 18 MCG inhalation capsule Place 1 capsule (18 mcg total) into inhaler and inhale daily. 07/04/15  Yes Fritzi Mandes, MD  Vitamin D, Ergocalciferol, (DRISDOL) 50000 units CAPS capsule Take 1 capsule (50,000 Units total) by mouth 2 (two) times a week. X 6 weeks. 12/30/15  Yes Milinda Pointer, MD     Review of Systems  Constitutional: Positive for fatigue. Negative for diaphoresis and appetite change.  HENT: Negative for congestion, postnasal drip and sore throat.   Eyes: Negative.   Respiratory: Positive for cough and shortness of breath. Negative for chest tightness.   Cardiovascular: Positive for chest pain. Negative for palpitations and leg swelling.  Gastrointestinal: Positive for nausea. Negative for vomiting, abdominal pain and abdominal distention.  Endocrine: Negative.   Genitourinary: Negative.   Musculoskeletal: Positive for back pain. Negative for muscle weakness and neck pain.  Skin: Negative.   Allergic/Immunologic:  Negative.   Neurological: Positive for light-headedness. Negative for dizziness and headaches.  Hematological: Negative for adenopathy. Bruises/bleeds easily.  Psychiatric/Behavioral: Positive for sleep disturbance (chronic trouble sleeping; wearing oxygen at 2L). Negative for dysphoric mood. The patient is nervous/anxious.        Objective:   Physical Exam  Constitutional: She is oriented to person, place, and time. She appears well-developed and well-nourished.  HENT:  Head: Normocephalic and atraumatic.  Eyes: Conjunctivae are normal. Pupils are equal, round, and reactive to light.  Neck: Normal range of motion.  Cardiovascular: Normal rate and regular rhythm.   Pulmonary/Chest: Effort normal. She has no wheezes. She has rhonchi in the right upper field, the right lower field, the left upper field and the left lower field. She has no rales.  Abdominal: Soft. She exhibits no distension. There is no tenderness.  Musculoskeletal: She exhibits tenderness. She exhibits no edema.       Arms: Neurological: She is alert and oriented to person, place, and time.  Skin: Skin is  warm and dry.  Psychiatric: Her mood appears anxious. Her speech is not rapid and/or pressured. She is not agitated. She does not exhibit a depressed mood.  Nursing note and vitals reviewed.   BP 123/78 mmHg  Pulse 77  Resp 20  Ht _0  (1.575 m)  Wt 254 lb (115.214 kg)  BMI 46.45 kg/m2  SpO2 98%       Assessment & Plan:  1: Intercostal chest pain- Patient presents with sternal chest pain that began on 01/31/16 (3 days ago). She says the pain woke her up and was probably a 6 out of 10 at that time. Since then it has improved and now she describes it as an ache and a 3 on the 10 point pain scale. Did have some right arm radiation but that has since resolved. She does admit to being more active and has been coughing more but doesn't remember doing anything to cause the pain. EKG was done to rule out ischemia and it's  unchanged from a previous one done in October 2016. Based on the tenderness elicited upon palpation, unchanged EKG and her description of intense coughing, most likely this is a costalchondritis pain that should continue to improve. However, did advise her that should the pain intensity increase or any other symptoms re-occur to call us back or her cardiologist. Patient was comfortable with this plan. 2: Chronic heart failure with preserved ejection fraction- Patient continues to have fatigue and shortness of breath but feels like those symptoms are improving. She is becoming more active and trying to do more to keep herself moving. She continues to weigh herself and reports a gradual weight loss. By our scale, she's lost 5 pounds since she was last here January 2017. Reminded to call for an overnight weight gain of >2 pounds or a weekly weight gain of >5 pounds. She continues to not add salt and follow a low sodium diet. Recently saw her cardiologist on 01/11/16. 3: HTN- Blood pressure looks good today. Continue medications. 4: COPD- Rhonchi throughout and she says that she hasn't done her inhalers or used her nebulizer yet this morning. Is going to use them when she returns home. Wears her oxygen at 2L around the clock. Recently saw her pulmonologist on 01/18/16. 5: Tobacco use- Patient continues to smoke about 1/2 pack of cigarettes daily and admits that she really doesn't want to quit. Understands the potential health benefits of quitting but she says that she uses smoking as a stress reliever and she is aware that she needs to stop. Does remove her oxygen prior to smoking. Discussed cessation for 3 minutes with her.   Patient did not bring her medications nor a list. Each medication was verbally reviewed with the patient and she was encouraged to bring the bottles to every visit to confirm accuracy of list.  Return here in 3 months or sooner for any questions/problems before then.

## 2016-02-04 NOTE — Addendum Note (Signed)
Addended by: Darylene Price A on: 02/04/2016 01:32 PM   Modules accepted: Orders, Medications

## 2016-02-09 ENCOUNTER — Encounter: Payer: Self-pay | Admitting: Pain Medicine

## 2016-02-09 ENCOUNTER — Other Ambulatory Visit: Payer: Self-pay

## 2016-02-09 ENCOUNTER — Ambulatory Visit: Payer: Medicare PPO | Attending: Pain Medicine | Admitting: Pain Medicine

## 2016-02-09 VITALS — BP 103/64 | HR 70 | Temp 99.0°F | Resp 18 | Ht 62.0 in | Wt 254.0 lb

## 2016-02-09 DIAGNOSIS — J449 Chronic obstructive pulmonary disease, unspecified: Secondary | ICD-10-CM | POA: Insufficient documentation

## 2016-02-09 DIAGNOSIS — M25552 Pain in left hip: Secondary | ICD-10-CM | POA: Diagnosis not present

## 2016-02-09 DIAGNOSIS — F419 Anxiety disorder, unspecified: Secondary | ICD-10-CM | POA: Diagnosis not present

## 2016-02-09 DIAGNOSIS — Z5181 Encounter for therapeutic drug level monitoring: Secondary | ICD-10-CM

## 2016-02-09 DIAGNOSIS — J310 Chronic rhinitis: Secondary | ICD-10-CM | POA: Diagnosis not present

## 2016-02-09 DIAGNOSIS — I5032 Chronic diastolic (congestive) heart failure: Secondary | ICD-10-CM | POA: Diagnosis not present

## 2016-02-09 DIAGNOSIS — I10 Essential (primary) hypertension: Secondary | ICD-10-CM | POA: Insufficient documentation

## 2016-02-09 DIAGNOSIS — M545 Low back pain, unspecified: Secondary | ICD-10-CM

## 2016-02-09 DIAGNOSIS — M25511 Pain in right shoulder: Secondary | ICD-10-CM | POA: Diagnosis not present

## 2016-02-09 DIAGNOSIS — Z79891 Long term (current) use of opiate analgesic: Secondary | ICD-10-CM | POA: Diagnosis not present

## 2016-02-09 DIAGNOSIS — M47816 Spondylosis without myelopathy or radiculopathy, lumbar region: Secondary | ICD-10-CM | POA: Diagnosis not present

## 2016-02-09 DIAGNOSIS — M25551 Pain in right hip: Secondary | ICD-10-CM | POA: Diagnosis not present

## 2016-02-09 DIAGNOSIS — E559 Vitamin D deficiency, unspecified: Secondary | ICD-10-CM | POA: Insufficient documentation

## 2016-02-09 DIAGNOSIS — M1610 Unilateral primary osteoarthritis, unspecified hip: Secondary | ICD-10-CM | POA: Insufficient documentation

## 2016-02-09 DIAGNOSIS — F329 Major depressive disorder, single episode, unspecified: Secondary | ICD-10-CM | POA: Diagnosis not present

## 2016-02-09 DIAGNOSIS — M5126 Other intervertebral disc displacement, lumbar region: Secondary | ICD-10-CM | POA: Insufficient documentation

## 2016-02-09 DIAGNOSIS — F17210 Nicotine dependence, cigarettes, uncomplicated: Secondary | ICD-10-CM | POA: Diagnosis not present

## 2016-02-09 DIAGNOSIS — G5600 Carpal tunnel syndrome, unspecified upper limb: Secondary | ICD-10-CM | POA: Diagnosis not present

## 2016-02-09 DIAGNOSIS — E872 Acidosis: Secondary | ICD-10-CM | POA: Diagnosis not present

## 2016-02-09 DIAGNOSIS — M5116 Intervertebral disc disorders with radiculopathy, lumbar region: Secondary | ICD-10-CM | POA: Insufficient documentation

## 2016-02-09 DIAGNOSIS — G8929 Other chronic pain: Secondary | ICD-10-CM | POA: Insufficient documentation

## 2016-02-09 DIAGNOSIS — Z6841 Body Mass Index (BMI) 40.0 and over, adult: Secondary | ICD-10-CM | POA: Diagnosis not present

## 2016-02-09 DIAGNOSIS — M549 Dorsalgia, unspecified: Secondary | ICD-10-CM | POA: Diagnosis present

## 2016-02-09 DIAGNOSIS — M47896 Other spondylosis, lumbar region: Secondary | ICD-10-CM | POA: Insufficient documentation

## 2016-02-09 DIAGNOSIS — D7589 Other specified diseases of blood and blood-forming organs: Secondary | ICD-10-CM | POA: Diagnosis not present

## 2016-02-09 MED ORDER — HYDROCODONE-ACETAMINOPHEN 5-325 MG PO TABS: 1.0000 | ORAL_TABLET | Freq: Three times a day (TID) | ORAL | Status: DC | PRN

## 2016-02-09 NOTE — Progress Notes (Signed)
Hydrocodone pill count # 53/90  Filled 01-28-16

## 2016-02-09 NOTE — Patient Instructions (Signed)
Smoking Cessation, Tips for Success If you are ready to quit smoking, congratulations! You have chosen to help yourself be healthier. Cigarettes bring nicotine, tar, carbon monoxide, and other irritants into your body. Your lungs, heart, and blood vessels will be able to work better without these poisons. There are many different ways to quit smoking. Nicotine gum, nicotine patches, a nicotine inhaler, or nicotine nasal spray can help with physical craving. Hypnosis, support groups, and medicines help break the habit of smoking. WHAT THINGS CAN I DO TO MAKE QUITTING EASIER?  Here are some tips to help you quit for good:  Pick a date when you will quit smoking completely. Tell all of your friends and family about your plan to quit on that date.  Do not try to slowly cut down on the number of cigarettes you are smoking. Pick a quit date and quit smoking completely starting on that day.  Throw away all cigarettes.   Clean and remove all ashtrays from your home, work, and car.  On a card, write down your reasons for quitting. Carry the card with you and read it when you get the urge to smoke.  Cleanse your body of nicotine. Drink enough water and fluids to keep your urine clear or pale yellow. Do this after quitting to flush the nicotine from your body.  Learn to predict your moods. Do not let a bad situation be your excuse to have a cigarette. Some situations in your life might tempt you into wanting a cigarette.  Never have "just one" cigarette. It leads to wanting another and another. Remind yourself of your decision to quit.  Change habits associated with smoking. If you smoked while driving or when feeling stressed, try other activities to replace smoking. Stand up when drinking your coffee. Brush your teeth after eating. Sit in a different chair when you read the paper. Avoid alcohol while trying to quit, and try to drink fewer caffeinated beverages. Alcohol and caffeine may urge you to  smoke.  Avoid foods and drinks that can trigger a desire to smoke, such as sugary or spicy foods and alcohol.  Ask people who smoke not to smoke around you.  Have something planned to do right after eating or having a cup of coffee. For example, plan to take a walk or exercise.  Try a relaxation exercise to calm you down and decrease your stress. Remember, you may be tense and nervous for the first 2 weeks after you quit, but this will pass.  Find new activities to keep your hands busy. Play with a pen, coin, or rubber band. Doodle or draw things on paper.  Brush your teeth right after eating. This will help cut down on the craving for the taste of tobacco after meals. You can also try mouthwash.   Use oral substitutes in place of cigarettes. Try using lemon drops, carrots, cinnamon sticks, or chewing gum. Keep them handy so they are available when you have the urge to smoke.  When you have the urge to smoke, try deep breathing.  Designate your home as a nonsmoking area.  If you are a heavy smoker, ask your health care provider about a prescription for nicotine chewing gum. It can ease your withdrawal from nicotine.  Reward yourself. Set aside the cigarette money you save and buy yourself something nice.  Look for support from others. Join a support group or smoking cessation program. Ask someone at home or at work to help you with your plan   to quit smoking.  Always ask yourself, "Do I need this cigarette or is this just a reflex?" Tell yourself, "Today, I choose not to smoke," or "I do not want to smoke." You are reminding yourself of your decision to quit.  Do not replace cigarette smoking with electronic cigarettes (commonly called e-cigarettes). The safety of e-cigarettes is unknown, and some may contain harmful chemicals.  If you relapse, do not give up! Plan ahead and think about what you will do the next time you get the urge to smoke. HOW WILL I FEEL WHEN I QUIT SMOKING? You  may have symptoms of withdrawal because your body is used to nicotine (the addictive substance in cigarettes). You may crave cigarettes, be irritable, feel very hungry, cough often, get headaches, or have difficulty concentrating. The withdrawal symptoms are only temporary. They are strongest when you first quit but will go away within 10-14 days. When withdrawal symptoms occur, stay in control. Think about your reasons for quitting. Remind yourself that these are signs that your body is healing and getting used to being without cigarettes. Remember that withdrawal symptoms are easier to treat than the major diseases that smoking can cause.  Even after the withdrawal is over, expect periodic urges to smoke. However, these cravings are generally short lived and will go away whether you smoke or not. Do not smoke! WHAT RESOURCES ARE AVAILABLE TO HELP ME QUIT SMOKING? Your health care provider can direct you to community resources or hospitals for support, which may include:  Group support.  Education.  Hypnosis.  Therapy.   This information is not intended to replace advice given to you by your health care provider. Make sure you discuss any questions you have with your health care provider.   Document Released: 07/01/2004 Document Revised: 10/24/2014 Document Reviewed: 03/21/2013 Elsevier Interactive Patient Education [employer].

## 2016-02-09 NOTE — Progress Notes (Signed)
Patient's Name: Theresa [name]  Patient type: Established  MRN: [id number]  Service setting: Ambulatory outpatient  DOB: 05-05-1956  Location: [facility]  DOS: 02/09/2016  Primary Care Physician: [name], PA  Note by: Kathlen Brunswick. Dossie Arbour, M.D, DABA, DABAPM, DABPM, DABIPP, FIPP  Referring Physician: Margo Common, PA  Specialty: Board-Certified Interventional Pain Management     Primary Reason(s) for Visit: Encounter for prescription drug management (Level of risk: moderate), as well as postprocedure evaluation. CC: Back Pain   HPI  Ms. Fellows is a 60 y.o. year old, female patient, who returns today as an established patient. She has Chronic respiratory acidosis; Neuritis or radiculitis due to rupture of lumbar intervertebral disc; Decreased motor strength; Essential (primary) hypertension; Clinical depression; DDD (degenerative disc disease), lumbar; CAFL (chronic airflow limitation) ([place]); Chronic rhinitis; Carpal tunnel syndrome; Blood clotting disorder ([place]); Anxiety state; Chronic diastolic heart failure ([place]); Chest pain; Tobacco abuse; Chronic pain; Encounter for therapeutic drug level monitoring; Encounter for chronic pain management; Pain management; Neurogenic pain; Neuropathic pain; Musculoskeletal pain; Lumbar spondylosis (L4-5); Chronic low back pain (Location of Primary Source of Pain) (Left); Chronic lower extremity pain (Location of Secondary source of pain) (Left); Chronic lumbar radicular pain (Location of Secondary source of pain) (S1 Dermatome) (Left); Chronic shoulder pain (Right side); Chronic upper extremity pain (Right-sided); Cervical radiculitis (Right side); Abnormal x-ray of lumbar spine; Chronic pain syndrome (significant psychosocial component); 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; Chronic obstructive pulmonary disease ([place]); Coagulation disorder ([place]); Chronic hip pain (Location of Primary Source of Pain)  (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); 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 Her primarily concern today is the Back Pain   Pain Assessment: Self-Reported Pain Score: 5  Reported level is compatible with observation Pain Type: Chronic pain Pain Location: Back Pain Orientation: Lower Pain Descriptors / Indicators: Aching, Squeezing (feels like breaking into) Pain Frequency: Constant  The patient returns to the clinics today for pharmacological management of her chronic pain and for postprocedure evaluation. She recently had a diagnostic left sided lumbar facet block under fluoroscopic guidance and IV sedation which did provide patient with 90% relief of the low back pain and leg pain. Her pain is very difficult to evaluate because as long as she is either sitting or laying down she has no back pain. However, whenever she stands up then she gets this severe pain in the lower back. The one thing that the left sided lumbar facet block that it was to completely eliminate her leg pain. This confirms that her leg pain was referred from her lower back. Unfortunately, she does have a BMI of 46.45 which increases her incidence of chronic low back pain by over 254 %. She has lost approximately 16 pounds since she started coming here and I have encouraged her to continue doing that. We have talked to her and put her goal had a weight of 160 pounds which would be a BMI of 29.0.  At this point we are waiting for her lumbar MRI which is scheduled for the beginning of May (next month). As soon as we get that then we can come up with a plan to continue trying to decrease her  pain.  Date of Last Visit: 01/28/16 Service Provided on Last Visit: Procedure (left lumbar facet block)  Controlled Substance Pharmacotherapy Assessment & REMS (Risk Evaluation and Mitigation Strategy)  Analgesic: Hydrocodone/APAP 5/325 one every 8 hours (15 mg/day) Pill Count: Hydrocodone pill count # 53/90 Filled 01-28-16 MME/day: 15 mg/day.  Pharmacokinetics: Onset of action (Liberation/Absorption): Within expected pharmacological parameters Time to Peak effect (Distribution): Timing and results are as within normal expected parameters Duration of action (Metabolism/Excretion): Within normal limits for medication Pharmacodynamics: Analgesic Effect: More than 50% Activity Facilitation: Medication(s) allow patient to sit, stand, walk, and do the basic ADLs Perceived Effectiveness: Described as relatively effective, allowing for increase in activities of daily living (ADL) Side-effects or Adverse reactions: None reported Monitoring: [place] PMP: Online review of the past 27-monthperiod conducted. Compliant with practice rules and regulations UDS Results/interpretation: Last UDS done on 01/12/2016 came back with unexpected results since it was negative for the alprazolam and hydrocodone that she had indicated she was taking. Medication Assessment Form: Reviewed. Patient indicates being compliant with therapy Treatment compliance: Compliant Risk Assessment: Aberrant Behavior: None observed today Substance Use Disorder (SUD) Risk Level: Moderate-to-high Risk of opioid abuse or dependence: 0.7-3.0% with doses ? 36 MME/day and 6.1-26% with doses ? 120 MME/day. Opioid Risk Tool (ORT) Score:  7 Moderate Risk for SUD (Score between 4-7) Depression Scale Score: PHQ-2: PHQ-2 Total Score: 0 No depression (0) PHQ-9: PHQ-9 Total Score: 0 No depression (0-4)  Pharmacologic Plan: No change in therapy, at this time  Post-Procedure Assessment  Procedure done on last visit: Diagnostic left sided lumbar  facet block under fluoroscopic guidance and IV sedation. Side-effects or Adverse reactions: None reported Sedation: Sedation given  Results: Ultra-Short Term Relief (First 1 hour after procedure): 90 % (leg was better, but still had pain in lower back)  Analgesia during this period is likely to be Local Anesthetic and/or IV Sedative (Analgesic/Anxiolitic) related Short Term Relief (Initial 4-6 hrs after procedure): 25 % (leg pain is 100%, lower back 25%) Complete relief confirms area to be the source of pain Long Term Relief : 25 % (leg pain is gone, but back pain is not improved) Long-term benefit would suggest an inflammatory etiology to the pain   Current Relief (Now): 100% for the leg pain.  Persistent relief would suggest effective anti-inflammatory effects from steroids Interpretation of Results: The response for disc diagnostic block would suggest that the patient has a significant component of her low back and leg pain that is coming from the facet joints. The fact that the leg pain has not returned would suggest that he was referred, suspected.  Laboratory Chemistry  Inflammation Markers Lab Results  Component Value Date   ESRSEDRATE 5 09/17/2015   CRP 1.1* 09/17/2015    Renal Function Lab Results  Component Value Date   BUN 23* 12/29/2015   CREATININE 0.62 12/29/2015   GFRAA >60 12/29/2015   GFRNONAA >60 12/29/2015    Hepatic Function Lab Results  Component Value Date   AST 27 12/29/2015   ALT 28 12/29/2015   ALBUMIN 3.9 12/29/2015    Electrolytes Lab Results  Component Value Date   NA 138 12/29/2015   K 4.0 12/29/2015   CL 101 12/29/2015   CALCIUM 9.9 12/29/2015   MG 2.1 09/17/2015    Pain Modulating Vitamins No results found for: V[place] VD125OH2TOT, VFG1829HB7 [id number] VITAMINB12  Coagulation Parameters No results found for: INR, LABPROT  Note: I personally reviewed the above data. Results shared with patient.  Meds  The patient has a current  medication list which includes the following prescription(s): advair diskus, albuterol, alprazolam, aspirin-caffeine, bisoprolol-hydrochlorothiazide, cholecalciferol, furosemide, hydrocodone-acetaminophen, ipratropium-albuterol, oxygen-helium, roflumilast, sertraline, tiotropium, and vitamin d (ergocalciferol).  Current Outpatient Prescriptions on File Prior to Visit  Medication Sig  . ADVAIR DISKUS 250-50 MCG/DOSE AEPB Inhale 1 puff into the lungs 2 (two) times daily.   Marland Kitchen albuterol (PROVENTIL HFA;VENTOLIN HFA) 108 (90 BASE) MCG/ACT inhaler Inhale 2 puffs into the lungs every 6 (six) hours as needed for wheezing. Reported on 11/03/2015  . ALPRAZolam (XANAX) 0.5 MG tablet Take 1 tablet (0.5 mg total) by mouth at bedtime as needed for anxiety.  . Aspirin-Caffeine (BC FAST PAIN RELIEF ARTHRITIS) 1000-65 MG PACK Take 1 packet by mouth every 6 (six) hours as needed (Pain).  . bisoprolol-hydrochlorothiazide (ZIAC) 10-6.25 MG tablet TAKE 1 TABLET BY MOUTH DAILY  . Cholecalciferol ([employer] D3) 2000 units CAPS Take 1 capsule by mouth daily.  Marland Kitchen ipratropium-albuterol (DUONEB) 0.5-2.5 (3) MG/3ML SOLN Take 3 mLs by nebulization every 6 (six) hours. (Patient taking differently: Take 3 mLs by nebulization every 6 (six) hours as needed (shortness of breath). )  . OXYGEN Inhale 2.5 L into the lungs continuous. Reported on 11/03/2015  . roflumilast (DALIRESP) 500 MCG TABS tablet Take 500 mcg by mouth daily.  . sertraline (ZOLOFT) 100 MG tablet Take 1 tablet (100 mg total) by mouth daily.  Marland Kitchen tiotropium (SPIRIVA) 18 MCG inhalation capsule Place 1 capsule (18 mcg total) into inhaler and inhale daily.  . Vitamin D, Ergocalciferol, (DRISDOL) 50000 units CAPS capsule Take 1 capsule (50,000 Units total) by mouth 2 (two) times a week. X 6 weeks.   No current facility-administered medications on file prior to visit.    ROS  Constitutional: Afebrile, no chills, well hydrated and well nourished Gastrointestinal: No upper or  lower GI bleeding, no nausea, no vomiting and no acute GI distress Musculoskeletal: No acute joint swelling or redness, no acute loss of range of motion and no acute onset weakness Neurological: Denies any acute onset apraxia, no episodes of paralysis, no acute loss of coordination, no acute loss of consciousness and no acute onset aphasia, dysarthria, agnosia, or amnesia  Allergies  Ms. Betsill is allergic to codeine.  [place]  Medical:  Ms. Veenstra  has a past medical history of COPD (chronic obstructive pulmonary disease) ([place]); Hypertension; Gastritis; Lumbar radiculitis; Muscle weakness; DDD (degenerative disc disease), cervical; Depression; Chronic rhinitis; Chronic generalized abdominal pain; Anxiety; Edema; Carpal tunnel syndrome; Low back pain; Hematuria; Malodorous urine; Sensory urge incontinence; Obesity; Renal cyst; Tobacco abuse; and CHF (congestive heart failure) ([place]). Family: family history includes Alcohol abuse in her father; Coronary artery disease in her mother; Heart disease in her father and mother; Lung cancer in her sister; Stroke in her mother. There is no history of Prostate cancer. Surgical:  has past surgical history that includes Tubal ligation; Abdominal hysterectomy; Tonsillectomy; and Carpal tunnel release (Left). Tobacco:  reports that she has been smoking Cigarettes.  She has a 40 pack-year smoking history. She has never used smokeless tobacco. Alcohol:  reports that she does not drink alcohol. Drug:  reports that she does not use illicit drugs.  Physical Examination  Constitutional Vitals: Blood pressure 103/64, pulse 70, temperature 99 F (37.2 C), resp. rate 18, height _0  (1.575 m), weight 254 lb (115.214 kg), SpO2 94 %. Calculated BMI: Body mass index is 46.45 kg/(m^2). (>40 kg/m2) Extreme obesity (Class III) - 254% higher incidence of chronic pain General appearance: Alert, cooperative,  oriented x 3, in no acute distress, well nourished, well developed,  well hydrated Eyes: PERLA Respiratory: No evidence respiratory distress, no audible rales or ronchi and no use of accessory muscles of respiration Psych: Alert, oriented to person, oriented to place and oriented to time  Cervical Spine Exam  Inspection: Normal anatomy, no anomalies observed Cervical Lordosis: Normal Alignment: Symetrical Functional ROM: Within functional limits (WFL) AROM: WFL Sensory: No sensory anomalies reported or detected  Upper Extremity Exam    Right  Left  Inspection: No gross anomalies detected  Inspection: No gross anomalies detected  Functional ROM: Adequate  Functional ROM: Adequate  AROM: Adequate  AROM: Adequate  Sensory: No sensory anomalies reported or detected  Sensory: No sensory anomalies reported or detected  Motor: Unremarkable  Motor: Unremarkable  Vascular: Normal skin color, temperature, and hair growth. No peripheral edema or cyanosis  Vascular: Normal skin color, temperature, and hair growth. No peripheral edema or cyanosis   Thoracic Spine  Inspection: No gross anomalies detected Alignment: Symetrical Functional ROM: Within functional limits [facility]) AROM: Adequate Palpation: WNL  Lumbar Spine  Inspection: No gross anomalies detected  Alignment: Symetrical  Functional ROM: Within functional limits [facility])  AROM: Decreased  Sensory: No sensory anomalies reported or detected  Palpation: Tender  Provocative Tests: Lumbar Hyperextension and rotation test: Positive bilaterally for lumbar facet pain.  Patrick's Maneuver: deferred   Gait Assessment  Gait: Antalgic (limping), using a wheelchair due to a combination of her morbid obesity, severe COPD with oxygen dependence, and severe deconditioning.  Lower Extremities    Right  Left  Inspection: No gross anomalies detected  Inspection: No gross anomalies detected  Functional ROM: Within functional limits [facility])  Functional ROM: Within functional limits (WFL)  AROM: Adequate  AROM: Adequate   Sensory: No sensory anomalies reported or detected  Sensory: No sensory anomalies reported or detected  Motor: Unremarkable  Motor: Unremarkable  Toe walk (S1): WNL  Toe walk (S1): WNL  Heal walk (L5): WNL  Heal walk (L5): WNL   Assessment & Plan  Primary Diagnosis & Pertinent Problem List: The primary encounter diagnosis was Chronic pain. Diagnoses of Encounter for therapeutic drug level monitoring, Long term current use of opiate analgesic, Chronic low back pain (Location of Primary Source of Pain) (Left), Lumbar facet syndrome (Location of Primary Source of Pain) (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), and Lumbar spondylosis, unspecified spinal osteoarthritis were also pertinent to this visit.  Visit Diagnosis: 1. Chronic pain   2. Encounter for therapeutic drug level monitoring   3. Long term current use of opiate analgesic   4. Chronic low back pain (Location of Primary Source of Pain) (Left)   5. Lumbar facet syndrome (Location of Primary Source of Pain) (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)   6. Lumbar spondylosis, unspecified spinal osteoarthritis     Problems updated and reviewed during this visit: No problems updated.  Problem-specific Plan(s): No problem-specific assessment & plan notes found for this encounter.  No new assessment & plan notes have been filed under this hospital service since the last note was generated. Service: Pain Management   Plan of Care   Problem List Items Addressed This Visit      High   Chronic low back pain (Location of Primary Source of Pain) (Left) (Chronic)   Relevant Medications   HYDROcodone-acetaminophen (NORCO/VICODIN) 5-325 MG tablet   Chronic pain - Primary (Chronic)   Relevant Medications   HYDROcodone-acetaminophen (NORCO/VICODIN) 5-325 MG tablet   Lumbar facet syndrome (Location of Primary Source of Pain) (Bilateral) (  L>R) (Chronic)   Relevant Medications   HYDROcodone-acetaminophen (NORCO/VICODIN) 5-325 MG tablet   Lumbar spondylosis (L4-5) (Chronic)   Relevant  Medications   HYDROcodone-acetaminophen (NORCO/VICODIN) 5-325 MG tablet     Medium   Encounter for therapeutic drug level monitoring   Long term current use of opiate analgesic (Chronic)       Pharmacotherapy (Medications Ordered): Meds ordered this encounter  Medications  . HYDROcodone-acetaminophen (NORCO/VICODIN) 5-325 MG tablet    Sig: Take 1 tablet by mouth every 8 (eight) hours as needed for severe pain.    Dispense:  90 tablet    Refill:  0    Do not add this medication to the electronic "Automatic Refill" notification system. Patient may have prescription filled one day early if pharmacy is closed on scheduled refill date. Do not fill until: 02/26/16 To last until: 03/27/16    [facility] & Procedure Ordered: No orders of the defined types were placed in this encounter.    Imaging Ordered: None  Interventional Therapies: Scheduled:  None at this time.    Considering:  I initially did a left-sided lumbar facet block under fluoroscopic guidance, but we need to consider doing both sides to see if this can tone down her pain.    PRN Procedures:  Not at this time.    Referral(s) or Consult(s): None at this time.  New Prescriptions   No medications on file    Medications administered during this visit: Ms. Toothaker had no medications administered during this visit.  Future Appointments Date Time Provider [place]  02/23/2016 1:00 PM [facility]  05/04/2016 1:00 PM [name], FNP [facility] None    Primary Care Physician: [name], PA Location: [facility] Note by: Kathlen Brunswick. Dossie Arbour, M.D, DABA, DABAPM, DABPM, DABIPP, FIPP  Pain Score Disclaimer: We use the NRS-11 scale. This is a self-reported, subjective measurement of pain severity with only modest accuracy. It is used primarily to identify changes within a particular patient. It must be understood that outpatient pain scales are significantly less  accurate that those used for research, where they can be applied under ideal controlled circumstances with minimal exposure to variables. In reality, the score is likely to be a combination of pain intensity and pain affect, where pain affect describes the degree of emotional arousal or changes in action readiness caused by the sensory experience of pain. Factors such as social and work situation, setting, emotional state, anxiety levels, expectation, and prior pain experience may influence pain perception and show large inter-individual differences that may also be affected by time variables.

## 2016-02-09 NOTE — Progress Notes (Signed)
Safety precautions to be maintained throughout the outpatient stay will include: orient to surroundings, keep bed in low position, maintain call bell within reach at all times, provide assistance with transfer out of bed and ambulation.

## 2016-02-09 NOTE — Patient Outreach (Signed)
[facility]) Care Management  02/09/2016  Theresa [name] 01/30/1956 [id number]     Multiple attempts to establish contact with patient. No response from patient. Case is being closed at this time.   Plan: RN CM will notify [facility] administrative assistant of case status. RN CM will send MD case closure letter.   Enzo Montgomery, RN,BSN,CCM [place] Management Telephonic Care Management Coordinator Direct Phone: [phone] Toll Free: [phone] Fax: [phone]

## 2016-02-23 ENCOUNTER — Ambulatory Visit (INDEPENDENT_AMBULATORY_CARE_PROVIDER_SITE_OTHER): Payer: Medicare PPO | Admitting: Family Medicine

## 2016-02-23 ENCOUNTER — Encounter: Payer: Self-pay | Admitting: Family Medicine

## 2016-02-23 ENCOUNTER — Ambulatory Visit
Admission: RE | Admit: 2016-02-23 | Discharge: 2016-02-23 | Disposition: A | Discharge status: Home / Self Care | Payer: Medicare PPO | Source: Ambulatory Visit | Attending: Pain Medicine | Admitting: Pain Medicine

## 2016-02-23 VITALS — BP 102/78 | HR 77 | Temp 98.8°F | Resp 18 | Wt 261.4 lb

## 2016-02-23 DIAGNOSIS — M5416 Radiculopathy, lumbar region: Secondary | ICD-10-CM | POA: Diagnosis present

## 2016-02-23 DIAGNOSIS — G8929 Other chronic pain: Secondary | ICD-10-CM | POA: Diagnosis present

## 2016-02-23 DIAGNOSIS — J029 Acute pharyngitis, unspecified: Secondary | ICD-10-CM

## 2016-02-23 DIAGNOSIS — M5126 Other intervertebral disc displacement, lumbar region: Secondary | ICD-10-CM | POA: Diagnosis not present

## 2016-02-23 DIAGNOSIS — J441 Chronic obstructive pulmonary disease with (acute) exacerbation: Secondary | ICD-10-CM

## 2016-02-23 LAB — POCT RAPID STREP A (OFFICE): Rapid Strep A Screen: NEGATIVE

## 2016-02-23 MED ORDER — IPRATROPIUM-ALBUTEROL 0.5-2.5 (3) MG/3ML IN SOLN: 3.0000 mL | Freq: Four times a day (QID) | RESPIRATORY_TRACT | Status: DC | PRN

## 2016-02-23 MED ORDER — AMOXICILLIN 875 MG PO TABS: 875.0000 mg | ORAL_TABLET | Freq: Two times a day (BID) | ORAL | Status: DC

## 2016-02-23 NOTE — Progress Notes (Signed)
Patient ID: Masiya Claassen, female   DOB: Mar 03, 1956, 60 y.o.   MRN: [id number]   Patient: Nelwyn Hebdon Female    DOB: Dec 03, 1955   60 y.o.   MRN: [id number] Visit Date: 02/23/2016  Today's Provider: [name], PA   Chief Complaint  Patient presents with  . Sore Throat   Subjective:    Sore Throat  This is a new problem. Episode onset: 3 days ago. The problem has been unchanged. Neither side of throat is experiencing more pain than the other. There has been no fever. Associated symptoms include congestion, coughing, a hoarse voice and shortness of breath. Associated symptoms comments: Chills last night. No documented fever. Large amount of clear sputum in the mornings. Sore throat after taking care of her grandson (who has strep throat) over the past weekend.. She has had exposure to strep. She has tried acetaminophen for the symptoms. The treatment provided no relief.   Past Medical History  Diagnosis Date  . COPD (chronic obstructive pulmonary disease) ([place])   . Hypertension   . Gastritis   . Lumbar radiculitis   . Muscle weakness   . DDD (degenerative disc disease), cervical   . Depression   . Chronic rhinitis   . Chronic generalized abdominal pain   . Anxiety   . Edema   . Carpal tunnel syndrome   . Low back pain   . Hematuria   . Malodorous urine   . Sensory urge incontinence   . Obesity   . Renal cyst   . Tobacco abuse   . CHF (congestive heart failure) [facility])    Past Surgical History  Procedure Laterality Date  . Tubal ligation    . Abdominal hysterectomy    . Tonsillectomy    . Carpal tunnel release Left    Family History  Problem Relation Age of Onset  . Heart disease Mother   . Stroke Mother   . Coronary artery disease Mother   . Prostate cancer Neg Hx   . Lung cancer Sister   . Alcohol abuse Father   . Heart disease Father    Previous Medications   ADVAIR DISKUS 250-50 MCG/DOSE AEPB    Inhale 1 puff into the lungs 2 (two) times daily.    ALBUTEROL  (PROVENTIL HFA;VENTOLIN HFA) 108 (90 BASE) MCG/ACT INHALER    Inhale 2 puffs into the lungs every 6 (six) hours as needed for wheezing. Reported on 11/03/2015   ALPRAZOLAM (XANAX) 0.5 MG TABLET    Take 1 tablet (0.5 mg total) by mouth at bedtime as needed for anxiety.   ASPIRIN-CAFFEINE (BC FAST PAIN RELIEF ARTHRITIS) 1000-65 MG PACK    Take 1 packet by mouth every 6 (six) hours as needed (Pain).   BISOPROLOL-HYDROCHLOROTHIAZIDE (ZIAC) 10-6.25 MG TABLET    TAKE 1 TABLET BY MOUTH DAILY   CHOLECALCIFEROL ([employer] D3) 2000 UNITS CAPS    Take 1 capsule by mouth daily.   FUROSEMIDE (LASIX) 20 MG TABLET    20 mg daily.    HYDROCODONE-ACETAMINOPHEN (NORCO/VICODIN) 5-325 MG TABLET    Take 1 tablet by mouth every 8 (eight) hours as needed for severe pain.   IPRATROPIUM-ALBUTEROL (DUONEB) 0.5-2.5 (3) MG/3ML SOLN    Take 3 mLs by nebulization every 6 (six) hours.   OXYGEN    Inhale 2.5 L into the lungs continuous. Reported on 11/03/2015   ROFLUMILAST (DALIRESP) 500 MCG TABS TABLET    Take 500 mcg by mouth daily.   SERTRALINE (ZOLOFT) 100 MG TABLET  Take 1 tablet (100 mg total) by mouth daily.   TIOTROPIUM (SPIRIVA) 18 MCG INHALATION CAPSULE    Place 1 capsule (18 mcg total) into inhaler and inhale daily.   VITAMIN D, ERGOCALCIFEROL, (DRISDOL) 50000 UNITS CAPS CAPSULE    Take 1 capsule (50,000 Units total) by mouth 2 (two) times a week. X 6 weeks.   Allergies  Allergen Reactions  . Codeine Nausea And Vomiting    Review of Systems  Constitutional: Negative.   HENT: Positive for congestion, hoarse voice and sore throat.   Eyes: Negative.   Respiratory: Positive for cough and shortness of breath.   Cardiovascular: Negative.   Gastrointestinal: Negative.   Endocrine: Negative.   Genitourinary: Negative.   Musculoskeletal: Negative.   Skin: Negative.   Allergic/Immunologic: Negative.   Neurological: Negative.   Hematological: Negative.   Psychiatric/Behavioral: Negative.     Social History  Substance  Use Topics  . Smoking status: Current Every Day Smoker -- 1.00 packs/day for 40 years    Types: Cigarettes  . Smokeless tobacco: Never Used     Comment: 30+ years, pt states she has cut back to 4-5 cigarettes a day  . Alcohol Use: No   Objective:   BP 102/78 mmHg  Pulse 77  Temp(Src) 98.8 F (37.1 C) (Oral)  Resp 18  Wt 261 lb 6.4 oz (118.57 kg)  SpO2 95%  Physical Exam  Constitutional: She is oriented to person, place, and time. She appears well-developed and well-nourished.  HENT:  Head: Normocephalic.  Right Ear: External ear normal.  Left Ear: External ear normal.  Mouth/Throat: Oropharynx is clear and moist.  Eyes: Conjunctivae and EOM are normal.  Neck: Normal range of motion. Neck supple.  Cardiovascular: Normal rate and regular rhythm.   Pulmonary/Chest:  Some rhonchi without wheezing. Dyspnea better with 2.5 LPM oxygen by nasal cannula.  Abdominal: Soft. Bowel sounds are normal.  Neurological: She is alert and oriented to person, place, and time.  Psychiatric: She has a normal mood and affect. Her behavior is normal.      Assessment & Plan:     1. Sore throat Onset over the past 2-3 days. Took care of sick grandson over the past weekend only to find out he had strep throat the whole time. No fever today and strep test negative. Will give antibiotic empirically for suspected exacerbation of chronic bronchitis. Recheck in 1 week if no better. - POCT rapid strep A - amoxicillin (AMOXIL) 875 MG tablet; Take 1 tablet (875 mg total) by mouth 2 (two) times daily.  Dispense: 20 tablet; Refill: 0  2. COPD with acute exacerbation (HCC) Good pulse oximetry at 95% on the oxygen at 2.5 LPM. Still taking the Daliresp, Spiriva, Advair and prn proventil. Uses Duoneb by nebulizer for increase in dyspnea. Will treat with antibiotic and may add Mucinex-DM prn cough. May add Claritin or Zyrtec prn post nasal drip or rhinorrhea. Recheck prn. - amoxicillin (AMOXIL) 875 MG tablet; Take 1  tablet (875 mg total) by mouth 2 (two) times daily.  Dispense: 20 tablet; Refill: 0 - ipratropium-albuterol (DUONEB) 0.5-2.5 (3) MG/3ML SOLN; Take 3 mLs by nebulization every 6 (six) hours as needed (shortness of breath).  Dispense: 360 mL; Refill: 3

## 2016-03-01 ENCOUNTER — Ambulatory Visit: Payer: Medicare PPO | Attending: Pain Medicine | Admitting: Pain Medicine

## 2016-03-01 ENCOUNTER — Encounter: Payer: Self-pay | Admitting: Pain Medicine

## 2016-03-01 VITALS — BP 108/60 | HR 75 | Temp 98.5°F | Resp 18 | Ht 62.0 in | Wt 257.0 lb

## 2016-03-01 DIAGNOSIS — F1190 Opioid use, unspecified, uncomplicated: Secondary | ICD-10-CM

## 2016-03-01 DIAGNOSIS — M47816 Spondylosis without myelopathy or radiculopathy, lumbar region: Secondary | ICD-10-CM | POA: Diagnosis not present

## 2016-03-01 DIAGNOSIS — G8929 Other chronic pain: Secondary | ICD-10-CM | POA: Diagnosis not present

## 2016-03-01 DIAGNOSIS — Z79891 Long term (current) use of opiate analgesic: Secondary | ICD-10-CM | POA: Diagnosis not present

## 2016-03-01 DIAGNOSIS — Z5181 Encounter for therapeutic drug level monitoring: Secondary | ICD-10-CM | POA: Diagnosis not present

## 2016-03-01 DIAGNOSIS — M545 Low back pain, unspecified: Secondary | ICD-10-CM

## 2016-03-01 MED ORDER — HYDROCODONE-ACETAMINOPHEN 5-325 MG PO TABS: 1.0000 | ORAL_TABLET | Freq: Three times a day (TID) | ORAL | Status: DC | PRN

## 2016-03-01 NOTE — Progress Notes (Signed)
Patient's Name: Theresa [name]  Patient type: Established  MRN: [id number]  Service setting: Ambulatory outpatient  DOB: Jul 20, 1956  Location: [facility]  DOS: 03/01/2016  Primary Care Physician: [name], PA  Note by: Kathlen Brunswick. Dossie Arbour, M.D, DABA, DABAPM, DABPM, DABIPP, [place]  Referring Physician: Margo Common, PA  Specialty: Board-Certified Interventional Pain Management  Last Visit to Pain Management: 02/09/2016   Primary Reason(s) for Visit: Encounter for prescription drug management (Level of risk: moderate) CC: Back Pain   HPI  [name] is a 60 y.o. year old, female patient, who returns today as an established patient. She has Chronic respiratory acidosis; Neuritis or radiculitis due to rupture of lumbar intervertebral disc; Decreased motor strength; Essential (primary) hypertension; Clinical depression; DDD (degenerative disc disease), lumbar; CAFL (chronic airflow limitation) ([place]); Chronic rhinitis; Carpal tunnel syndrome; Blood clotting disorder ([place]); Anxiety state; Chronic diastolic heart failure ([place]); Chest pain; Tobacco abuse; Chronic pain; Encounter for therapeutic drug level monitoring; Encounter for chronic pain management; Pain management; Neurogenic pain; Neuropathic pain; Musculoskeletal pain; Lumbar spondylosis (L4-5); Chronic low back pain (Location of Primary Source of Pain) (Left); Chronic lower extremity pain (Location of Secondary source of pain) (Left); Chronic lumbar radicular pain (Location of Secondary source of pain) (S1 Dermatome) (Left); Chronic shoulder pain (Right side); Chronic upper extremity pain (Right-sided); Cervical radiculitis (Right side); Abnormal x-ray of lumbar spine; Chronic pain syndrome (significant psychosocial component); 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; Chronic obstructive pulmonary disease ([place]); Coagulation disorder ([place]); Chronic hip pain (Location of Primary Source of  Pain) (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); 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 Her primarily concern today is the Back Pain   Pain Assessment: Self-Reported Pain Score: 5  Reported level is compatible with observation Pain Type: Chronic pain Pain Location: Back Pain Orientation: Lower Pain Descriptors / Indicators: Aching, Squeezing, Throbbing Pain Frequency: Constant  The patient comes into the clinics today for pharmacological management of her chronic pain. I last saw this patient on 02/09/2016. Her body mass index is 46.99 kg/(m^2). The patient  reports that she does not use illicit drugs.  Date of Last Visit: 02/09/16 Service Provided on Last Visit: Med Refill  Controlled Substance Pharmacotherapy Assessment & REMS (Risk Evaluation and Mitigation Strategy)  Analgesic: Hydrocodone/APAP 5/325 one tablet every 8 hours (15 mg/day of hydrocodone) Pill Count: Hydrocodone pill count # 82/90 Filled 02-26-16. MME/day: 15 mg/day Pharmacokinetics: Onset of action (Liberation/Absorption): Within expected pharmacological parameters Time to Peak effect (Distribution): Timing and results are as within normal expected parameters Duration of action (Metabolism/Excretion): Within normal limits for medication Pharmacodynamics: Analgesic Effect: More than 50% Activity Facilitation: Medication(s) allow patient to sit, stand, walk, and do the basic ADLs Perceived Effectiveness: Described as relatively effective, allowing for increase in activities of daily living (ADL) Side-effects or Adverse reactions: None reported Monitoring: [place] PMP: Online review  of the past 69-monthperiod conducted. Compliant with practice rules and regulations UDS Results/interpretation: Last UDS done on 01/12/2016 came back abnormal due to the unexpected absence of the patient's hydrocodone. Medication Assessment Form: Reviewed. Patient indicates being compliant with therapy Treatment compliance: Compliant Risk Assessment: Aberrant Behavior: None observed today Substance Use Disorder (SUD) Risk Level: Moderate-to-high  Risk of opioid abuse or dependence: 0.7-3.0% with doses ? 36 MME/day and 6.1-26% with doses ? 120 MME/day. Opioid Risk Tool (ORT) Score: Total Score: 0 Low Risk for SUD (Score <3) Depression Scale Score: PHQ-2: PHQ-2 Total Score: 0 No depression (0) PHQ-9: PHQ-9 Total Score: 0 No depression (0-4)  Pharmacologic Plan: No change in therapy, at this time  Laboratory Chemistry  Inflammation Markers Lab Results  Component Value Date   ESRSEDRATE 5 09/17/2015   CRP 1.1* 09/17/2015    Renal Function Lab Results  Component Value Date   BUN 23* 12/29/2015   CREATININE 0.62 12/29/2015   GFRAA >60 12/29/2015   GFRNONAA >60 12/29/2015    Hepatic Function Lab Results  Component Value Date   AST 27 12/29/2015   ALT 28 12/29/2015   ALBUMIN 3.9 12/29/2015    Electrolytes Lab Results  Component Value Date   NA 138 12/29/2015   K 4.0 12/29/2015   CL 101 12/29/2015   CALCIUM 9.9 12/29/2015   MG 2.1 09/17/2015    Pain Modulating Vitamins No results found for: [place], VD125OH2TOT, [id number], [id number], VITAMINB12  Coagulation Parameters No results found for: INR, LABPROT  Note: I personally reviewed the above data. Results made available to patient.  Recent Diagnostic Imaging  Mr Lumbar Spine Wo Contrast  02/23/2016  CLINICAL DATA:  Low back pain and pressure with difficulty walking for more than 6 months. No acute injury or prior relevant surgery. EXAM: MRI LUMBAR SPINE WITHOUT CONTRAST TECHNIQUE: Multiplanar, multisequence MR imaging  of the lumbar spine was performed. No intravenous contrast was administered. COMPARISON:  Abdominal pelvic CT 07/21/2014.  Lumbar MRI 06/09/2014. FINDINGS: Segmentation: Conventional anatomy assumed, with the last open disc space designated L5-S1. Alignment:  Normal. Vertebrae: No worrisome osseous lesion, acute fracture or pars defect. Scattered hemangiomas are again noted, largest at T12. There are endplate degenerative changes at L4-5. The visualized sacroiliac joints appear unremarkable. Conus medullaris: Extends to the L1 level and appears normal. Paraspinal and other soft tissues: No significant paraspinal findings. Bilateral renal cystic lesions are grossly stable. Disc levels: No significant disc space findings from T11-12 through L1-2. L2-3: Minimal disc bulging with preserved disc height. There is a small right foraminal disc protrusion, best seen on the sagittal images. No L2 nerve root encroachment seen. L3-4: Mild loss of disc height with mild annular disc bulging, facet and ligamentous hypertrophy. No significant spinal stenosis or nerve root encroachment. L4-5: Chronic degenerative disc disease with loss of disc height, annular disc bulging and endplate osteophytes. There is a new left foraminal disc extrusion, demonstrating low signal on all pulse sequences, probably vacuum phenomena within the extruded disc fragment. This moderately narrows the left foramen and would be expected to cause left L4 nerve root encroachment. Disc bulging and endplate osteophytes contribute to mild underlying foraminal narrowing bilaterally. The lateral recesses are patent. L5-S1: Disc height and hydration are largely maintained. Mild bilateral facet hypertrophy. No spinal stenosis or nerve root encroachment. IMPRESSION: 1. Interval development of left foraminal disc extrusion with vacuum phenomenon at L4-5. There is resulting left foraminal narrowing and probable left L4 nerve root encroachment. 2. The findings at the  other levels are stable with mild disc bulging and facet hypertrophy. No others significant spinal stenosis or nerve root encroachment. Electronically Signed   By: Richardean Sale M.D.   On: 02/23/2016 14:08    Meds  The patient has a current medication list which includes the following prescription(s): advair diskus, albuterol, alprazolam, amoxicillin, aspirin-caffeine, bisoprolol-hydrochlorothiazide,  cholecalciferol, furosemide, hydrocodone-acetaminophen, ipratropium-albuterol, oxygen-helium, roflumilast, sertraline, and tiotropium.  Current Outpatient Prescriptions on File Prior to Visit  Medication Sig  . ADVAIR DISKUS 250-50 MCG/DOSE AEPB Inhale 1 puff into the lungs 2 (two) times daily.   Marland Kitchen albuterol (PROVENTIL HFA;VENTOLIN HFA) 108 (90 BASE) MCG/ACT inhaler Inhale 2 puffs into the lungs every 6 (six) hours as needed for wheezing. Reported on 11/03/2015  . ALPRAZolam (XANAX) 0.5 MG tablet Take 1 tablet (0.5 mg total) by mouth at bedtime as needed for anxiety.  Marland Kitchen amoxicillin (AMOXIL) 875 MG tablet Take 1 tablet (875 mg total) by mouth 2 (two) times daily.  . Aspirin-Caffeine (BC FAST PAIN RELIEF ARTHRITIS) 1000-65 MG PACK Take 1 packet by mouth every 6 (six) hours as needed (Pain).  . bisoprolol-hydrochlorothiazide (ZIAC) 10-6.25 MG tablet TAKE 1 TABLET BY MOUTH DAILY  . Cholecalciferol ([employer] D3) 2000 units CAPS Take 1 capsule by mouth daily.  . furosemide (LASIX) 20 MG tablet 20 mg daily.   Marland Kitchen ipratropium-albuterol (DUONEB) 0.5-2.5 (3) MG/3ML SOLN Take 3 mLs by nebulization every 6 (six) hours as needed (shortness of breath).  . OXYGEN Inhale 2.5 L into the lungs continuous. Reported on 11/03/2015  . roflumilast (DALIRESP) 500 MCG TABS tablet Take 500 mcg by mouth daily.  . sertraline (ZOLOFT) 100 MG tablet Take 1 tablet (100 mg total) by mouth daily.  Marland Kitchen tiotropium (SPIRIVA) 18 MCG inhalation capsule Place 1 capsule (18 mcg total) into inhaler and inhale daily.   No current  facility-administered medications on file prior to visit.    ROS  Constitutional: Denies any fever or chills Gastrointestinal: No reported hemesis, hematochezia, vomiting, or acute GI distress Musculoskeletal: Denies any acute onset joint swelling, redness, loss of ROM, or weakness Neurological: No reported episodes of acute onset apraxia, aphasia, dysarthria, agnosia, amnesia, paralysis, loss of coordination, or loss of consciousness  Allergies  Ms. Anastacio is allergic to codeine.  [place]  Medical:  Ms. Milan  has a past medical history of COPD (chronic obstructive pulmonary disease) ([place]); Hypertension; Gastritis; Lumbar radiculitis; Muscle weakness; DDD (degenerative disc disease), cervical; Depression; Chronic rhinitis; Chronic generalized abdominal pain; Anxiety; Edema; Carpal tunnel syndrome; Low back pain; Hematuria; Malodorous urine; Sensory urge incontinence; Obesity; Renal cyst; Tobacco abuse; and CHF (congestive heart failure) ([place]). Family: family history includes Alcohol abuse in her father; Coronary artery disease in her mother; Heart disease in her father and mother; Lung cancer in her sister; Stroke in her mother. There is no history of Prostate cancer. Surgical:  has past surgical history that includes Tubal ligation; Abdominal hysterectomy; Tonsillectomy; and Carpal tunnel release (Left). Tobacco:  reports that she has been smoking Cigarettes.  She has a 40 pack-year smoking history. She has never used smokeless tobacco. Alcohol:  reports that she does not drink alcohol. Drug:  reports that she does not use illicit drugs.  Constitutional Exam  Vitals: Blood pressure 108/60, pulse 75, temperature 98.5 F (36.9 C), resp. rate 18, height _0  (1.575 m), weight 257 lb (116.574 kg), SpO2 96 %. General appearance: Well nourished, well developed, and well hydrated. In no acute distress Calculated BMI/Body habitus: Body mass index is 46.99 kg/(m^2). (>40 kg/m2) Extreme obesity  (Class III) - 254% higher incidence of chronic pain Psych/Mental status: Alert and oriented x 3 (person, place, & time) Eyes: PERLA Respiratory: No evidence of acute respiratory distress  Cervical Spine Exam  Inspection: No masses, redness, or swelling Alignment: Symmetrical ROM: Functional: Adequate ROM Active: Unrestricted ROM Stability: No instability detected Muscle  strength & Tone: Functionally intact Sensory: Unimpaired Palpation: No complaints of tenderness  Upper Extremity (UE) Exam    Side: Right upper extremity  Side: Left upper extremity  Inspection: No masses, redness, swelling, or asymmetry  Inspection: No masses, redness, swelling, or asymmetry  ROM:  ROM:  Functional: Adequate ROM  Functional: Adequate ROM  Active: Unrestricted ROM  Active: Unrestricted ROM  Muscle strength & Tone: Functionally intact  Muscle strength & Tone: Functionally intact  Sensory: Unimpaired  Sensory: Unimpaired  Palpation: Non-contributory  Palpation: Non-contributory   Thoracic Spine Exam  Inspection: No masses, redness, or swelling Alignment: Symmetrical ROM: Functional: Adequate ROM Active: Unrestricted ROM Stability: No instability detected Sensory: Unimpaired Muscle strength & Tone: Functionally intact Palpation: No complaints of tenderness  Lumbar Spine Exam  Inspection: No masses, redness, or swelling Alignment: Symmetrical ROM: Functional: Limited ROM Active: Decreased ROM Stability: No instability detected Muscle strength & Tone: Functionally intact Sensory: Unimpaired Palpation: Tender Provocative Tests: Lumbar Hyperextension and rotation test: Positive for bilateral lumbar facet pain. Patrick's Maneuver: deferred  Gait & Posture Assessment  Gait: Unaffected Posture: WNL  Lower Extremity Exam    Side: Right lower extremity  Side: Left lower extremity  Inspection: No masses, redness, swelling, or asymmetry ROM:  Inspection: No masses, redness, swelling, or  asymmetry ROM:  Functional: Adequate ROM  Functional: Adequate ROM  Active: Unrestricted ROM  Active: Unrestricted ROM  Muscle strength & Tone: Functionally intact  Muscle strength & Tone: Functionally intact  Sensory: Unimpaired  Sensory: Unimpaired  Palpation: Non-contributory  Palpation: Non-contributory   Assessment & Plan  Primary Diagnosis & Pertinent Problem List: The primary encounter diagnosis was Chronic pain. Diagnoses of Encounter for therapeutic drug level monitoring, Long term current use of opiate analgesic, Lumbar spondylosis, unspecified spinal osteoarthritis, Lumbar facet syndrome (Location of Primary Source of Pain) (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), Chronic low back pain (Location of Primary Source of Pain) (Left), and Opiate use (15 MME/Day) were also pertinent to this visit.  Visit Diagnosis: 1. Chronic pain   2. Encounter for therapeutic drug level monitoring   3. Long term current use of opiate analgesic   4. Lumbar spondylosis, unspecified spinal osteoarthritis   5. Lumbar facet syndrome (Location of Primary Source of Pain) (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)   6. Chronic low back pain (Location of Primary Source of Pain) (Left)   7. Opiate use (15 MME/Day)     Problems updated and reviewed during this visit: Problem  Opiate use (15 MME/Day)   Hydrocodone/APAP 5/325 one every 8 hours (15 mg/day of hydrocodone)     Problem-specific Plan(s): No problem-specific assessment & plan notes found for this encounter.  No new assessment & plan notes have been filed under this hospital service since the last note was generated. Service: Pain Management   Plan of Care   Problem List Items Addressed This Visit      High   Chronic low back pain (Location of Primary Source of Pain) (Left) (Chronic)   Relevant Medications   HYDROcodone-acetaminophen (NORCO/VICODIN) 5-325 MG tablet   Chronic pain - Primary (Chronic)   Relevant Medications   HYDROcodone-acetaminophen (NORCO/VICODIN) 5-325 MG  tablet   Lumbar facet syndrome (Location of Primary Source of Pain) (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) (Chronic)   Relevant Medications   HYDROcodone-acetaminophen (NORCO/VICODIN) 5-325 MG tablet   Other Relevant Orders   LUMBAR FACET(MEDIAL BRANCH NERVE BLOCK) MBNB   Lumbar spondylosis (L4-5) (Chronic)   Relevant Medications   HYDROcodone-acetaminophen (NORCO/VICODIN) 5-325 MG tablet     Medium   Encounter  for therapeutic drug level monitoring   Long term current use of opiate analgesic (Chronic)   Relevant Orders   ToxASSURE Select 13 (MW), Urine   Opiate use (15 MME/Day) (Chronic)       Pharmacotherapy (Medications Ordered): Meds ordered this encounter  Medications  . HYDROcodone-acetaminophen (NORCO/VICODIN) 5-325 MG tablet    Sig: Take 1 tablet by mouth every 8 (eight) hours as needed for severe pain.    Dispense:  90 tablet    Refill:  0    Do not add this medication to the electronic "Automatic Refill" notification system. Patient may have prescription filled one day early if pharmacy is closed on scheduled refill date. Do not fill until: 03/27/16 To last until: 04/26/16    [facility] & Procedure Ordered: Orders Placed This Encounter  Procedures  . LUMBAR FACET(MEDIAL BRANCH NERVE BLOCK) MBNB  . ToxASSURE Select 13 (MW), Urine    Imaging Ordered: None  Interventional Therapies: Scheduled:  None at this time.    Considering:  Diagnostic left sided lumbar facet block under fluoroscopic guidance and IV sedation #2. Possible lumbar facet radiofrequency ablation.    PRN Procedures:  Diagnostic left sided lumbar facet block under fluoroscopic guidance and IV sedation #2.    Referral(s) or Consult(s): None at this time.  New Prescriptions   No medications on file    Medications administered during this visit: Ms. Macdonnell had no medications administered during this visit.  Requested PM Follow-up: Return for Procedure (PRN - Patient will call), Medication Management,  (1-Mo).  Future Appointments Date Time Provider [place]  03/30/2016 1:40 PM Milinda Pointer, MD [facility] None  05/04/2016 1:00 PM [name], FNP [facility] None    Primary Care Physician: [name], PA Location: [facility] Note by: Kathlen Brunswick. Dossie Arbour, M.D, DABA, DABAPM, DABPM, DABIPP, FIPP  Pain Score Disclaimer: We use the NRS-11 scale. This is a self-reported, subjective measurement of pain severity with only modest accuracy. It is used primarily to identify changes within a particular patient. It must be understood that outpatient pain scales are significantly less accurate that those used for research, where they can be applied under ideal controlled circumstances with minimal exposure to variables. In reality, the score is likely to be a combination of pain intensity and pain affect, where pain affect describes the degree of emotional arousal or changes in action readiness caused by the sensory experience of pain. Factors such as social and work situation, setting, emotional state, anxiety levels, expectation, and prior pain experience may influence pain perception and show large inter-individual differences that may also be affected by time variables.  Patient instructions provided during this appointment: Patient Instructions  Smoking Cessation, Tips for Success If you are ready to quit smoking, congratulations! You have chosen to help yourself be healthier. Cigarettes bring nicotine, tar, carbon monoxide, and other irritants into your body. Your lungs, heart, and blood vessels will be able to work better without these poisons. There are many different ways to quit smoking. Nicotine gum, nicotine patches, a nicotine inhaler, or nicotine nasal spray can help with physical craving. Hypnosis, support groups, and medicines help break the habit of smoking. WHAT THINGS CAN I DO TO MAKE QUITTING EASIER?  Here are some tips to help you quit for  good: 1. Pick a date when you will quit smoking completely. Tell all of your friends and family about your plan to quit on that date. 2. Do not try to slowly cut down on the  number of cigarettes you are smoking. Pick a quit date and quit smoking completely starting on that day. 3. Throw away all cigarettes.  4. Clean and remove all ashtrays from your home, work, and car. 5. On a card, write down your reasons for quitting. Carry the card with you and read it when you get the urge to smoke. 6. Cleanse your body of nicotine. Drink enough water and fluids to keep your urine clear or pale yellow. Do this after quitting to flush the nicotine from your body. 7. Learn to predict your moods. Do not let a bad situation be your excuse to have a cigarette. Some situations in your life might tempt you into wanting a cigarette. 8. Never have "just one" cigarette. It leads to wanting another and another. Remind yourself of your decision to quit. 9. Change habits associated with smoking. If you smoked while driving or when feeling stressed, try other activities to replace smoking. Stand up when drinking your coffee. Brush your teeth after eating. Sit in a different chair when you read the paper. Avoid alcohol while trying to quit, and try to drink fewer caffeinated beverages. Alcohol and caffeine may urge you to smoke. 10. Avoid foods and drinks that can trigger a desire to smoke, such as sugary or spicy foods and alcohol. 11. Ask people who smoke not to smoke around you. 12. Have something planned to do right after eating or having a cup of coffee. For example, plan to take a walk or exercise. 13. Try a relaxation exercise to calm you down and decrease your stress. Remember, you may be tense and nervous for the first 2 weeks after you quit, but this will pass. 86. Find new activities to keep your hands busy. Play with a pen, coin, or rubber band. Doodle or draw things on paper. 15. Brush your teeth right after  eating. This will help cut down on the craving for the taste of tobacco after meals. You can also try mouthwash.  16. Use oral substitutes in place of cigarettes. Try using lemon drops, carrots, cinnamon sticks, or chewing gum. Keep them handy so they are available when you have the urge to smoke. 17. When you have the urge to smoke, try deep breathing. 29. Designate your home as a nonsmoking area. 19. If you are a heavy smoker, ask your health care provider about a prescription for nicotine chewing gum. It can ease your withdrawal from nicotine. 20. Reward yourself. Set aside the cigarette money you save and buy yourself something nice. 21. Look for support from others. Join a support group or smoking cessation program. Ask someone at home or at work to help you with your plan to quit smoking. 22. Always ask yourself, "Do I need this cigarette or is this just a reflex?" Tell yourself, "Today, I choose not to smoke," or "I do not want to smoke." You are reminding yourself of your decision to quit. 23. Do not replace cigarette smoking with electronic cigarettes (commonly called e-cigarettes). The safety of e-cigarettes is unknown, and some may contain harmful chemicals. 24. If you relapse, do not give up! Plan ahead and think about what you will do the next time you get the urge to smoke. HOW WILL I FEEL WHEN I QUIT SMOKING? You may have symptoms of withdrawal because your body is used to nicotine (the addictive substance in cigarettes). You may crave cigarettes, be irritable, feel very hungry, cough often, get headaches, or have difficulty concentrating. The withdrawal symptoms  are only temporary. They are strongest when you first quit but will go away within 10-14 days. When withdrawal symptoms occur, stay in control. Think about your reasons for quitting. Remind yourself that these are signs that your body is healing and getting used to being without cigarettes. Remember that withdrawal symptoms are  easier to treat than the major diseases that smoking can cause.  Even after the withdrawal is over, expect periodic urges to smoke. However, these cravings are generally short lived and will go away whether you smoke or not. Do not smoke! WHAT RESOURCES ARE AVAILABLE TO HELP ME QUIT SMOKING? Your health care provider can direct you to community resources or hospitals for support, which may include:  Group support.  Education.  Hypnosis.  Therapy.   This information is not intended to replace advice given to you by your health care provider. Make sure you discuss any questions you have with your health care provider.   Document Released: 07/01/2004 Document Revised: 10/24/2014 Document Reviewed: 03/21/2013 Elsevier Interactive Patient Education 2016 [place] [facility] Patient Information  Description: The facets are joints in the spine between the vertebrae.  Like any joints in the body, facets can become irritated and painful.  Arthritis can also effect the facets.  By injecting steroids and local anesthetic in and around these joints, we can temporarily block the nerve supply to them.  Steroids act directly on irritated nerves and tissues to reduce selling and inflammation which often leads to decreased pain.  Facet blocks may be done anywhere along the spine from the neck to the low back depending upon the location of your pain.   After numbing the skin with local anesthetic (like Novocaine), a small needle is passed onto the facet joints under x-ray guidance.  You may experience a sensation of pressure while this is being done.  The entire block usually lasts about 15-25 minutes.   Conditions which may be treated by facet blocks:   Low back/buttock pain  Neck/shoulder pain  Certain types of headaches  Preparation for the injection:  1. Do not eat any solid food or dairy products within 8 hours of your appointment. 2. You may drink clear liquid up to 3 hours before  appointment.  Clear liquids include water, black coffee, juice or soda.  No milk or cream please. 3. You may take your regular medication, including pain medications, with a sip of water before your appointment.  Diabetics should hold regular insulin (if taken separately) and take 1/2 normal NPH dose the morning of the procedure.  Carry some sugar containing items with you to your appointment. 4. A driver must accompany you and be prepared to drive you home after your procedure. 5. Bring all your current medications with you. 6. An IV may be inserted and sedation may be given at the discretion of the physician. 7. A blood pressure cuff, EKG and other monitors will often be applied during the procedure.  Some patients may need to have extra oxygen administered for a short period. 8. You will be asked to provide medical information, including your allergies and medications, prior to the procedure.  We must know immediately if you are taking blood thinners (like Coumadin/Warfarin) or if you are allergic to IV iodine contrast (dye).  We must know if you could possible be pregnant.  Possible side-effects:   Bleeding from needle site  Infection (rare, may require surgery)  Nerve injury (rare)  Numbness & tingling (temporary)  Difficulty urinating (rare, temporary)  Spinal headache (a headache worse with upright posture)  Light-headedness (temporary)  Pain at injection site (serveral days)  Decreased blood pressure (rare, temporary)  Weakness in arm/leg (temporary)  Pressure sensation in back/neck (temporary)   Call if you experience:   Fever/chills associated with headache or increased back/neck pain  Headache worsened by an upright position  New onset, weakness or numbness of an extremity below the injection site  Hives or difficulty breathing (go to the emergency room)  Inflammation or drainage at the injection site(s)  Severe back/neck pain greater than usual  New  symptoms which are concerning to you  Please note:  Although the local anesthetic injected can often make your back or neck feel good for several hours after the injection, the pain will likely return. It takes 3-7 days for steroids to work.  You may not notice any pain relief for at least one week.  If effective, we will often do a series of 2-3 injections spaced 3-6 weeks apart to maximally decrease your pain.  After the initial series, you may be a candidate for a more permanent nerve block of the facets.  If you have any questions, please call #[phone]) [facility]

## 2016-03-01 NOTE — Progress Notes (Signed)
Hydrocodone pill count # [id number]/90  Filled 02-26-16

## 2016-03-01 NOTE — Patient Instructions (Addendum)
Smoking Cessation, Tips for Success If you are ready to quit smoking, congratulations! You have chosen to help yourself be healthier. Cigarettes bring nicotine, tar, carbon monoxide, and other irritants into your body. Your lungs, heart, and blood vessels will be able to work better without these poisons. There are many different ways to quit smoking. Nicotine gum, nicotine patches, a nicotine inhaler, or nicotine nasal spray can help with physical craving. Hypnosis, support groups, and medicines help break the habit of smoking. WHAT THINGS CAN I DO TO MAKE QUITTING EASIER?  Here are some tips to help you quit for good: 1. Pick a date when you will quit smoking completely. Tell all of your friends and family about your plan to quit on that date. 2. Do not try to slowly cut down on the number of cigarettes you are smoking. Pick a quit date and quit smoking completely starting on that day. 3. Throw away all cigarettes.  4. Clean and remove all ashtrays from your home, work, and car. 5. On a card, write down your reasons for quitting. Carry the card with you and read it when you get the urge to smoke. 6. Cleanse your body of nicotine. Drink enough water and fluids to keep your urine clear or pale yellow. Do this after quitting to flush the nicotine from your body. 7. Learn to predict your moods. Do not let a bad situation be your excuse to have a cigarette. Some situations in your life might tempt you into wanting a cigarette. 8. Never have "just one" cigarette. It leads to wanting another and another. Remind yourself of your decision to quit. 9. Change habits associated with smoking. If you smoked while driving or when feeling stressed, try other activities to replace smoking. Stand up when drinking your coffee. Brush your teeth after eating. Sit in a different chair when you read the paper. Avoid alcohol while trying to quit, and try to drink fewer caffeinated beverages. Alcohol and caffeine may urge you  to smoke. 10. Avoid foods and drinks that can trigger a desire to smoke, such as sugary or spicy foods and alcohol. 11. Ask people who smoke not to smoke around you. 12. Have something planned to do right after eating or having a cup of coffee. For example, plan to take a walk or exercise. 13. Try a relaxation exercise to calm you down and decrease your stress. Remember, you may be tense and nervous for the first 2 weeks after you quit, but this will pass. 15. Find new activities to keep your hands busy. Play with a pen, coin, or rubber band. Doodle or draw things on paper. 15. Brush your teeth right after eating. This will help cut down on the craving for the taste of tobacco after meals. You can also try mouthwash.  16. Use oral substitutes in place of cigarettes. Try using lemon drops, carrots, cinnamon sticks, or chewing gum. Keep them handy so they are available when you have the urge to smoke. 17. When you have the urge to smoke, try deep breathing. 56. Designate your home as a nonsmoking area. 19. If you are a heavy smoker, ask your health care provider about a prescription for nicotine chewing gum. It can ease your withdrawal from nicotine. 20. Reward yourself. Set aside the cigarette money you save and buy yourself something nice. 21. Look for support from others. Join a support group or smoking cessation program. Ask someone at home or at work to help you with your plan  to quit smoking. 22. Always ask yourself, "Do I need this cigarette or is this just a reflex?" Tell yourself, "Today, I choose not to smoke," or "I do not want to smoke." You are reminding yourself of your decision to quit. 23. Do not replace cigarette smoking with electronic cigarettes (commonly called e-cigarettes). The safety of e-cigarettes is unknown, and some may contain harmful chemicals. 24. If you relapse, do not give up! Plan ahead and think about what you will do the next time you get the urge to smoke. HOW WILL  I FEEL WHEN I QUIT SMOKING? You may have symptoms of withdrawal because your body is used to nicotine (the addictive substance in cigarettes). You may crave cigarettes, be irritable, feel very hungry, cough often, get headaches, or have difficulty concentrating. The withdrawal symptoms are only temporary. They are strongest when you first quit but will go away within 10-14 days. When withdrawal symptoms occur, stay in control. Think about your reasons for quitting. Remind yourself that these are signs that your body is healing and getting used to being without cigarettes. Remember that withdrawal symptoms are easier to treat than the major diseases that smoking can cause.  Even after the withdrawal is over, expect periodic urges to smoke. However, these cravings are generally short lived and will go away whether you smoke or not. Do not smoke! WHAT RESOURCES ARE AVAILABLE TO HELP ME QUIT SMOKING? Your health care provider can direct you to community resources or hospitals for support, which may include:  Group support.  Education.  Hypnosis.  Therapy.   This information is not intended to replace advice given to you by your health care provider. Make sure you discuss any questions you have with your health care provider.   Document Released: 07/01/2004 Document Revised: 10/24/2014 Document Reviewed: 03/21/2013 Elsevier Interactive Patient Education 2016 [place] [facility] Patient Information  Description: The facets are joints in the spine between the vertebrae.  Like any joints in the body, facets can become irritated and painful.  Arthritis can also effect the facets.  By injecting steroids and local anesthetic in and around these joints, we can temporarily block the nerve supply to them.  Steroids act directly on irritated nerves and tissues to reduce selling and inflammation which often leads to decreased pain.  Facet blocks may be done anywhere along the spine from the neck to the  low back depending upon the location of your pain.   After numbing the skin with local anesthetic (like Novocaine), a small needle is passed onto the facet joints under x-ray guidance.  You may experience a sensation of pressure while this is being done.  The entire block usually lasts about 15-25 minutes.   Conditions which may be treated by facet blocks:   Low back/buttock pain  Neck/shoulder pain  Certain types of headaches  Preparation for the injection:  1. Do not eat any solid food or dairy products within 8 hours of your appointment. 2. You may drink clear liquid up to 3 hours before appointment.  Clear liquids include water, black coffee, juice or soda.  No milk or cream please. 3. You may take your regular medication, including pain medications, with a sip of water before your appointment.  Diabetics should hold regular insulin (if taken separately) and take 1/2 normal NPH dose the morning of the procedure.  Carry some sugar containing items with you to your appointment. 4. A driver must accompany you and be prepared to drive you home  after your procedure. 5. Bring all your current medications with you. 6. An IV may be inserted and sedation may be given at the discretion of the physician. 7. A blood pressure cuff, EKG and other monitors will often be applied during the procedure.  Some patients may need to have extra oxygen administered for a short period. 8. You will be asked to provide medical information, including your allergies and medications, prior to the procedure.  We must know immediately if you are taking blood thinners (like Coumadin/Warfarin) or if you are allergic to IV iodine contrast (dye).  We must know if you could possible be pregnant.  Possible side-effects:   Bleeding from needle site  Infection (rare, may require surgery)  Nerve injury (rare)  Numbness & tingling (temporary)  Difficulty urinating (rare, temporary)  Spinal headache (a headache worse  with upright posture)  Light-headedness (temporary)  Pain at injection site (serveral days)  Decreased blood pressure (rare, temporary)  Weakness in arm/leg (temporary)  Pressure sensation in back/neck (temporary)   Call if you experience:   Fever/chills associated with headache or increased back/neck pain  Headache worsened by an upright position  New onset, weakness or numbness of an extremity below the injection site  Hives or difficulty breathing (go to the emergency room)  Inflammation or drainage at the injection site(s)  Severe back/neck pain greater than usual  New symptoms which are concerning to you  Please note:  Although the local anesthetic injected can often make your back or neck feel good for several hours after the injection, the pain will likely return. It takes 3-7 days for steroids to work.  You may not notice any pain relief for at least one week.  If effective, we will often do a series of 2-3 injections spaced 3-6 weeks apart to maximally decrease your pain.  After the initial series, you may be a candidate for a more permanent nerve block of the facets.  If you have any questions, please call #[phone]) [facility]

## 2016-03-01 NOTE — Progress Notes (Signed)
Safety precautions to be maintained throughout the outpatient stay will include: orient to surroundings, keep bed in low position, maintain call bell within reach at all times, provide assistance with transfer out of bed and ambulation.

## 2016-03-07 ENCOUNTER — Ambulatory Visit
Admission: EM | Admit: 2016-03-07 | Discharge: 2016-03-07 | Disposition: A | Discharge status: Home / Self Care | Payer: Medicare PPO | Attending: Family Medicine | Admitting: Family Medicine

## 2016-03-07 ENCOUNTER — Ambulatory Visit (INDEPENDENT_AMBULATORY_CARE_PROVIDER_SITE_OTHER): Payer: Medicare PPO

## 2016-03-07 ENCOUNTER — Encounter: Payer: Self-pay | Admitting: Emergency Medicine

## 2016-03-07 DIAGNOSIS — S134XXA Sprain of ligaments of cervical spine, initial encounter: Secondary | ICD-10-CM

## 2016-03-07 DIAGNOSIS — S40011A Contusion of right shoulder, initial encounter: Secondary | ICD-10-CM

## 2016-03-07 MED ORDER — ORPHENADRINE CITRATE ER 100 MG PO TB12: 100.0000 mg | ORAL_TABLET | Freq: Two times a day (BID) | ORAL | Status: DC

## 2016-03-07 MED ORDER — KETOROLAC TROMETHAMINE 60 MG/2ML IM SOLN
60.0000 mg | Freq: Once | INTRAMUSCULAR | Status: AC
  Administered 2016-03-07: 60 mg via INTRAMUSCULAR

## 2016-03-07 MED ORDER — MELOXICAM 15 MG PO TABS: 15.0000 mg | ORAL_TABLET | Freq: Every day | ORAL | Status: DC

## 2016-03-07 NOTE — ED Notes (Signed)
Patient states that she fell yesterday evening after getting her oxygen tubbing caught on a piece of furniture.  Patient c/o right shoulder pain and neck pain.

## 2016-03-07 NOTE — Discharge Instructions (Signed)
Cervical Sprain A cervical sprain is when the tissues (ligaments) that hold the neck bones in place stretch or tear. HOME CARE   Put ice on the injured area.  Put ice in a plastic bag.  Place a towel between your skin and the bag.  Leave the ice on for 15-20 minutes, 3-4 times a day.  You may have been given a collar to wear. This collar keeps your neck from moving while you heal.  Do not take the collar off unless told by your doctor.  If you have long hair, keep it outside of the collar.  Ask your doctor before changing the position of your collar. You may need to change its position over time to make it more comfortable.  If you are allowed to take off the collar for cleaning or bathing, follow your doctor's instructions on how to do it safely.  Keep your collar clean by wiping it with mild soap and water. Dry it completely. If the collar has removable pads, remove them every 1-2 days to hand wash them with soap and water. Allow them to air dry. They should be dry before you wear them in the collar.  Do not drive while wearing the collar.  Only take medicine as told by your doctor.  Keep all doctor visits as told.  Keep all physical therapy visits as told.  Adjust your work station so that you have good posture while you work.  Avoid positions and activities that make your problems worse.  Warm up and stretch before being active. GET HELP IF:  Your pain is not controlled with medicine.  You cannot take less pain medicine over time as planned.  Your activity level does not improve as expected. GET HELP RIGHT AWAY IF:   You are bleeding.  Your stomach is upset.  You have an allergic reaction to your medicine.  You develop new problems that you cannot explain.  You lose feeling (become numb) or you cannot move any part of your body (paralysis).  You have tingling or weakness in any part of your body.  Your symptoms get worse. Symptoms include:  Pain,  soreness, stiffness, puffiness (swelling), or a burning feeling in your neck.  Pain when your neck is touched.  Shoulder or upper back pain.  Limited ability to move your neck.  Headache.  Dizziness.  Your hands or arms feel week, lose feeling, or tingle.  Muscle spasms.  Difficulty swallowing or chewing. MAKE SURE YOU:   Understand these instructions.  Will watch your condition.  Will get help right away if you are not doing well or get worse.   This information is not intended to replace advice given to you by your health care provider. Make sure you discuss any questions you have with your health care provider.   Document Released: 03/21/2008 Document Revised: 06/05/2013 Document Reviewed: 04/10/2013 Elsevier Interactive Patient Education 2016 [place] A contusion is a deep bruise. Contusions happen when an injury causes bleeding under the skin. Symptoms of bruising include pain, swelling, and discolored skin. The skin may turn blue, purple, or yellow. HOME CARE   Rest the injured area.  If told, put ice on the injured area.  Put ice in a plastic bag.  Place a towel between your skin and the bag.  Leave the ice on for 20 minutes, 2-3 times per day.  If told, put light pressure (compression) on the injured area using an elastic bandage. Make sure the bandage  is not too tight. Remove it and put it back on as told by your doctor.  If possible, raise (elevate) the injured area above the level of your heart while you are sitting or lying down.  Take over-the-counter and prescription medicines only as told by your doctor. GET HELP IF:  Your symptoms do not get better after several days of treatment.  Your symptoms get worse.  You have trouble moving the injured area. GET HELP RIGHT AWAY IF:   You have very bad pain.  You have a loss of feeling (numbness) in a hand or foot.  Your hand or foot turns pale or cold.   This information is not  intended to replace advice given to you by your health care provider. Make sure you discuss any questions you have with your health care provider.   Document Released: 03/21/2008 Document Revised: 06/24/2015 Document Reviewed: 02/18/2015 Elsevier Interactive Patient Education 2016 [employer]   Cryotherapy Cryotherapy is when you put ice on your injury. Ice helps lessen pain and puffiness (swelling) after an injury. Ice works the best when you start using it in the first 24 to 48 hours after an injury. HOME CARE  Put a dry or damp towel between the ice pack and your skin.  You may press gently on the ice pack.  Leave the ice on for no more than 10 to 20 minutes at a time.  Check your skin after 5 minutes to make sure your skin is okay.  Rest at least 20 minutes between ice pack uses.  Stop using ice when your skin loses feeling (numbness).  Do not use ice on someone who cannot tell you when it hurts. This includes small children and people with memory problems (dementia). GET HELP RIGHT AWAY IF:  You have white spots on your skin.  Your skin turns blue or pale.  Your skin feels waxy or hard.  Your puffiness gets worse. MAKE SURE YOU:   Understand these instructions.  Will watch your condition.  Will get help right away if you are not doing well or get worse.   This information is not intended to replace advice given to you by your health care provider. Make sure you discuss any questions you have with your health care provider.   Document Released: 03/21/2008 Document Revised: 12/26/2011 Document Reviewed: 05/26/2011 Elsevier Interactive Patient Education [employer].

## 2016-03-07 NOTE — ED Provider Notes (Signed)
CSN: [id number]     Arrival date & time 03/07/16  1224 History   First MD Initiated Contact with Patient 03/07/16 1415    Nurses notes were reviewed. Chief Complaint  Patient presents with  . Shoulder Pain  Patient reports falling yesterday she was getting into the shower. She reports to cord on her oxygen tank got caught and choked her and she fell back. She fell back she jerked her neck now having neck pain and she landed on her right shoulder and now has right shoulder pain. The right shoulder is with most the pain is at. She says a 6 out of 10 but she is unable to use her right shoulder.   She has a history of COPD chronic back pain and she still smokes. She has hypertension CHF and recurrent urinary tract infections and hematuria she has had a abdominal hysterectomy,t ubal ligation, tonsillectomy, and carpal tunnel surgery  Family medical history is positive for hypertension and diabetes    (Consider location/radiation/quality/duration/timing/severity/associated sxs/prior Treatment) HPI  Past Medical History  Diagnosis Date  . COPD (chronic obstructive pulmonary disease) ([place])   . Hypertension   . Gastritis   . Lumbar radiculitis   . Muscle weakness   . DDD (degenerative disc disease), cervical   . Depression   . Chronic rhinitis   . Chronic generalized abdominal pain   . Anxiety   . Edema   . Carpal tunnel syndrome   . Low back pain   . Hematuria   . Malodorous urine   . Sensory urge incontinence   . Obesity   . Renal cyst   . Tobacco abuse   . CHF (congestive heart failure) [facility])    Past Surgical History  Procedure Laterality Date  . Tubal ligation    . Abdominal hysterectomy    . Tonsillectomy    . Carpal tunnel release Left    Family History  Problem Relation Age of Onset  . Heart disease Mother   . Stroke Mother   . Coronary artery disease Mother   . Prostate cancer Neg Hx   . Lung cancer Sister   . Alcohol abuse Father   . Heart disease Father     Social History  Substance Use Topics  . Smoking status: Current Every Day Smoker -- 1.00 packs/day for 40 years    Types: Cigarettes  . Smokeless tobacco: Never Used     Comment: 30+ years, pt states she has cut back to 4-5 cigarettes a day  . Alcohol Use: No   OB History    No data available     Review of Systems  All other systems reviewed and are negative.   Allergies  Codeine  Home Medications   Prior to Admission medications   Medication Sig Start Date End Date Taking? Authorizing Provider  ADVAIR DISKUS 250-50 MCG/DOSE AEPB Inhale 1 puff into the lungs 2 (two) times daily.  09/13/15   Historical Provider, MD  albuterol (PROVENTIL HFA;VENTOLIN HFA) 108 (90 BASE) MCG/ACT inhaler Inhale 2 puffs into the lungs every 6 (six) hours as needed for wheezing. Reported on 11/03/2015    Historical Provider, MD  ALPRAZolam Duanne Moron) 0.5 MG tablet Take 1 tablet (0.5 mg total) by mouth at bedtime as needed for anxiety. 07/04/15   Fritzi Mandes, MD  amoxicillin (AMOXIL) 875 MG tablet Take 1 tablet (875 mg total) by mouth 2 (two) times daily. 02/23/16   Vickki Muff Chrismon, PA  Aspirin-Caffeine (BC FAST PAIN RELIEF ARTHRITIS) 1000-65 MG  PACK Take 1 packet by mouth every 6 (six) hours as needed (Pain).    Historical Provider, MD  bisoprolol-hydrochlorothiazide [facility]) 10-6.25 MG tablet TAKE 1 TABLET BY MOUTH DAILY 12/08/15   Vickki Muff Chrismon, PA  Cholecalciferol ([facility] D3) 2000 units CAPS Take 1 capsule by mouth daily.    Historical Provider, MD  furosemide (LASIX) 20 MG tablet 20 mg daily.  12/14/15   Historical Provider, MD  HYDROcodone-acetaminophen (NORCO/VICODIN) 5-325 MG tablet Take 1 tablet by mouth every 8 (eight) hours as needed for severe pain. 03/01/16   Milinda Pointer, MD  ipratropium-albuterol (DUONEB) 0.5-2.5 (3) MG/3ML SOLN Take 3 mLs by nebulization every 6 (six) hours as needed (shortness of breath). 02/23/16   Vickki Muff Chrismon, PA  OXYGEN Inhale 2.5 L into the lungs continuous. Reported  on 11/03/2015    Historical Provider, MD  roflumilast (DALIRESP) 500 MCG TABS tablet Take 500 mcg by mouth daily.    Historical Provider, MD  sertraline (ZOLOFT) 100 MG tablet Take 1 tablet (100 mg total) by mouth daily. 12/07/15   Vickki Muff Chrismon, PA  tiotropium (SPIRIVA) 18 MCG inhalation capsule Place 1 capsule (18 mcg total) into inhaler and inhale daily. 07/04/15   Fritzi Mandes, MD   Meds Ordered and Administered this Visit   Medications  ketorolac (TORADOL) injection 60 mg (60 mg Intramuscular Given 03/07/16 1427)    BP 136/77 mmHg  Pulse 75  Temp(Src) 97.9 F (36.6 C) (Tympanic)  Resp 17  Ht 5' 2.5" (1.588 m)  Wt 250 lb (113.399 kg)  BMI 44.97 kg/m2  SpO2 95% No data found.   Physical Exam  Constitutional: She is oriented to person, place, and time. She appears well-developed and well-nourished.  HENT:  Head: Normocephalic and atraumatic.  Right Ear: External ear normal.  Left Ear: External ear normal.  Eyes: Pupils are equal, round, and reactive to light.  Neck: Normal range of motion. Neck supple. Muscular tenderness present. No spinous process tenderness present.    Pulmonary/Chest: Effort normal.  Musculoskeletal: She exhibits tenderness.       Right shoulder: She exhibits decreased range of motion, tenderness and pain. She exhibits no deformity.       Arms: Patient's trouble raising her right arm above her head. She is can raise to about 90 angle for the pain gets to be too great to go further. She is able to have some resistance against pushing the arm down with the inverted thumb. She has most of the tenderness is in the area over the anterior right shoulder  Neurological: She is alert and oriented to person, place, and time.  Skin: Skin is warm.  Psychiatric: She has a normal mood and affect.  Vitals reviewed.   ED Course  Procedures (including critical care time)  Labs Review Labs Reviewed - No data to display  Imaging Review Dg Cervical Spine  Complete  03/07/2016  CLINICAL DATA:  Acute lower neck pain after fall yesterday. Initial encounter. EXAM: CERVICAL SPINE - COMPLETE 4+ VIEW COMPARISON:  CT scan of December 31, 2013. FINDINGS: No fracture or spondylolisthesis is noted. Mild degenerative disc disease is noted at C5-6. Moderate degenerative disc disease is noted at C6-7. IMPRESSION: Multilevel degenerative disc disease is noted in lower cervical spine. No acute abnormality seen in the cervical spine. Electronically Signed   By: Marijo Conception, M.D.   On: 03/07/2016 15:26   Dg Shoulder Right  03/07/2016  CLINICAL DATA:  Fall yesterday, right shoulder EXAM: RIGHT SHOULDER -  2+ VIEW COMPARISON:  12/31/2013 FINDINGS: Three views of the right shoulder submitted. No acute fracture or subluxation. Degenerative changes AC joint. Glenohumeral joint is preserved. IMPRESSION: No acute fracture or subluxation.  Degenerative changes AC joint. Electronically Signed   By: Lahoma Crocker M.D.   On: 03/07/2016 15:16     Visual Acuity Review  Right Eye Distance:   Left Eye Distance:   Bilateral Distance:    Right Eye Near:   Left Eye Near:    Bilateral Near:         MDM   1. Shoulder contusion, right, initial encounter   2. Whiplash injuries, initial encounter    Discussed patient's needs to follow-up with PCP if shoulder is not improving within a week to 2 weeks. Explained to her that my examination was limited because of mild pain present and we need to make sure that she has not have a rotator cuff tear. We'll place her on Mobic 15 mg and Norflex milligrams twice a day she states she has narcotics at home from a pain clinic and from her chronic back pain.   Nurses notes were reviewed.    Frederich Cha, MD 03/07/16 [phone]

## 2016-03-08 LAB — TOXASSURE SELECT 13 (MW), URINE

## 2016-03-30 ENCOUNTER — Encounter: Payer: Medicare PPO | Admitting: Pain Medicine

## 2016-03-30 NOTE — Progress Notes (Signed)
Quick Note:  Results were reviewed and found to be: significantly abnormal   Based on these results: interventional pain management techniques may be of benefit  In addition: surgical consultation may be of benefit. ______

## 2016-04-17 ENCOUNTER — Other Ambulatory Visit: Payer: Self-pay | Admitting: Family Medicine

## 2016-05-03 ENCOUNTER — Telehealth: Payer: Self-pay

## 2016-05-03 ENCOUNTER — Ambulatory Visit: Payer: Medicare PPO | Admitting: Family

## 2016-05-03 NOTE — Telephone Encounter (Signed)
Called patient to let her know that we received a fax from [name] to renew her Rx for her ventilator. Patient will need an appt for this. I have the forms on my desk. Thanks.

## 2016-05-04 ENCOUNTER — Ambulatory Visit: Payer: Medicare PPO | Admitting: Family

## 2016-05-05 ENCOUNTER — Encounter: Payer: Self-pay | Admitting: Family

## 2016-05-05 ENCOUNTER — Ambulatory Visit: Payer: Medicare PPO | Attending: Family | Admitting: Family

## 2016-05-05 ENCOUNTER — Encounter: Payer: Self-pay | Admitting: Pain Medicine

## 2016-05-05 ENCOUNTER — Ambulatory Visit: Payer: Medicare PPO | Attending: Pain Medicine | Admitting: Pain Medicine

## 2016-05-05 VITALS — BP 110/71 | HR 79 | Temp 98.2°F | Resp 18 | Ht 63.0 in | Wt 252.0 lb

## 2016-05-05 VITALS — BP 122/73 | HR 76 | Resp 20 | Ht 62.0 in | Wt 260.0 lb

## 2016-05-05 DIAGNOSIS — F32A Depression, unspecified: Secondary | ICD-10-CM

## 2016-05-05 DIAGNOSIS — M25551 Pain in right hip: Secondary | ICD-10-CM | POA: Diagnosis not present

## 2016-05-05 DIAGNOSIS — Z79891 Long term (current) use of opiate analgesic: Secondary | ICD-10-CM

## 2016-05-05 DIAGNOSIS — Z9981 Dependence on supplemental oxygen: Secondary | ICD-10-CM | POA: Insufficient documentation

## 2016-05-05 DIAGNOSIS — M5030 Other cervical disc degeneration, unspecified cervical region: Secondary | ICD-10-CM | POA: Insufficient documentation

## 2016-05-05 DIAGNOSIS — M5126 Other intervertebral disc displacement, lumbar region: Secondary | ICD-10-CM | POA: Diagnosis not present

## 2016-05-05 DIAGNOSIS — F329 Major depressive disorder, single episode, unspecified: Secondary | ICD-10-CM

## 2016-05-05 DIAGNOSIS — R319 Hematuria, unspecified: Secondary | ICD-10-CM | POA: Insufficient documentation

## 2016-05-05 DIAGNOSIS — R0601 Orthopnea: Secondary | ICD-10-CM | POA: Insufficient documentation

## 2016-05-05 DIAGNOSIS — E559 Vitamin D deficiency, unspecified: Secondary | ICD-10-CM | POA: Diagnosis not present

## 2016-05-05 DIAGNOSIS — E872 Acidosis: Secondary | ICD-10-CM | POA: Diagnosis not present

## 2016-05-05 DIAGNOSIS — M5116 Intervertebral disc disorders with radiculopathy, lumbar region: Secondary | ICD-10-CM | POA: Insufficient documentation

## 2016-05-05 DIAGNOSIS — I110 Hypertensive heart disease with heart failure: Secondary | ICD-10-CM | POA: Insufficient documentation

## 2016-05-05 DIAGNOSIS — M25511 Pain in right shoulder: Secondary | ICD-10-CM | POA: Diagnosis not present

## 2016-05-05 DIAGNOSIS — R05 Cough: Secondary | ICD-10-CM | POA: Diagnosis not present

## 2016-05-05 DIAGNOSIS — R600 Localized edema: Secondary | ICD-10-CM | POA: Insufficient documentation

## 2016-05-05 DIAGNOSIS — F418 Other specified anxiety disorders: Secondary | ICD-10-CM | POA: Insufficient documentation

## 2016-05-05 DIAGNOSIS — I272 Other secondary pulmonary hypertension: Secondary | ICD-10-CM | POA: Diagnosis not present

## 2016-05-05 DIAGNOSIS — M545 Low back pain, unspecified: Secondary | ICD-10-CM

## 2016-05-05 DIAGNOSIS — M6281 Muscle weakness (generalized): Secondary | ICD-10-CM | POA: Insufficient documentation

## 2016-05-05 DIAGNOSIS — F1190 Opioid use, unspecified, uncomplicated: Secondary | ICD-10-CM

## 2016-05-05 DIAGNOSIS — Z6841 Body Mass Index (BMI) 40.0 and over, adult: Secondary | ICD-10-CM | POA: Insufficient documentation

## 2016-05-05 DIAGNOSIS — I5032 Chronic diastolic (congestive) heart failure: Secondary | ICD-10-CM | POA: Insufficient documentation

## 2016-05-05 DIAGNOSIS — G8929 Other chronic pain: Secondary | ICD-10-CM | POA: Diagnosis not present

## 2016-05-05 DIAGNOSIS — M160 Bilateral primary osteoarthritis of hip: Secondary | ICD-10-CM | POA: Diagnosis not present

## 2016-05-05 DIAGNOSIS — R0602 Shortness of breath: Secondary | ICD-10-CM | POA: Insufficient documentation

## 2016-05-05 DIAGNOSIS — G545 Neuralgic amyotrophy: Secondary | ICD-10-CM | POA: Insufficient documentation

## 2016-05-05 DIAGNOSIS — E669 Obesity, unspecified: Secondary | ICD-10-CM | POA: Diagnosis not present

## 2016-05-05 DIAGNOSIS — Z888 Allergy status to other drugs, medicaments and biological substances status: Secondary | ICD-10-CM | POA: Insufficient documentation

## 2016-05-05 DIAGNOSIS — Z90710 Acquired absence of both cervix and uterus: Secondary | ICD-10-CM | POA: Diagnosis not present

## 2016-05-05 DIAGNOSIS — M791 Myalgia: Secondary | ICD-10-CM | POA: Diagnosis not present

## 2016-05-05 DIAGNOSIS — N281 Cyst of kidney, acquired: Secondary | ICD-10-CM | POA: Insufficient documentation

## 2016-05-05 DIAGNOSIS — Z7982 Long term (current) use of aspirin: Secondary | ICD-10-CM | POA: Insufficient documentation

## 2016-05-05 DIAGNOSIS — I10 Essential (primary) hypertension: Secondary | ICD-10-CM

## 2016-05-05 DIAGNOSIS — F419 Anxiety disorder, unspecified: Secondary | ICD-10-CM | POA: Insufficient documentation

## 2016-05-05 DIAGNOSIS — Z720 Tobacco use: Secondary | ICD-10-CM

## 2016-05-05 DIAGNOSIS — J310 Chronic rhinitis: Secondary | ICD-10-CM | POA: Diagnosis not present

## 2016-05-05 DIAGNOSIS — Z5181 Encounter for therapeutic drug level monitoring: Secondary | ICD-10-CM

## 2016-05-05 DIAGNOSIS — M47896 Other spondylosis, lumbar region: Secondary | ICD-10-CM | POA: Insufficient documentation

## 2016-05-05 DIAGNOSIS — F17210 Nicotine dependence, cigarettes, uncomplicated: Secondary | ICD-10-CM | POA: Diagnosis not present

## 2016-05-05 DIAGNOSIS — M549 Dorsalgia, unspecified: Secondary | ICD-10-CM | POA: Insufficient documentation

## 2016-05-05 DIAGNOSIS — Z885 Allergy status to narcotic agent status: Secondary | ICD-10-CM | POA: Diagnosis not present

## 2016-05-05 DIAGNOSIS — R002 Palpitations: Secondary | ICD-10-CM | POA: Diagnosis not present

## 2016-05-05 DIAGNOSIS — J449 Chronic obstructive pulmonary disease, unspecified: Secondary | ICD-10-CM | POA: Insufficient documentation

## 2016-05-05 DIAGNOSIS — M5010 Cervical disc disorder with radiculopathy, unspecified cervical region: Secondary | ICD-10-CM | POA: Insufficient documentation

## 2016-05-05 DIAGNOSIS — G5600 Carpal tunnel syndrome, unspecified upper limb: Secondary | ICD-10-CM | POA: Insufficient documentation

## 2016-05-05 DIAGNOSIS — M533 Sacrococcygeal disorders, not elsewhere classified: Secondary | ICD-10-CM | POA: Insufficient documentation

## 2016-05-05 DIAGNOSIS — N3941 Urge incontinence: Secondary | ICD-10-CM | POA: Diagnosis not present

## 2016-05-05 DIAGNOSIS — M25552 Pain in left hip: Secondary | ICD-10-CM | POA: Diagnosis not present

## 2016-05-05 DIAGNOSIS — R5383 Other fatigue: Secondary | ICD-10-CM | POA: Insufficient documentation

## 2016-05-05 DIAGNOSIS — J410 Simple chronic bronchitis: Secondary | ICD-10-CM

## 2016-05-05 IMAGING — CR DG CHEST 2V
1 series · 2 of 2 positions shown · non-contrast
Comparison: 07/09/2015

CLINICAL DATA: Productive cough for 5 days. Coughing up green
mucus.

EXAM:
CHEST  2 VIEW

[Series 1: dg chest 2 view · 0.14mm/px · 2 of 2 slices shown]
[im 1/2]
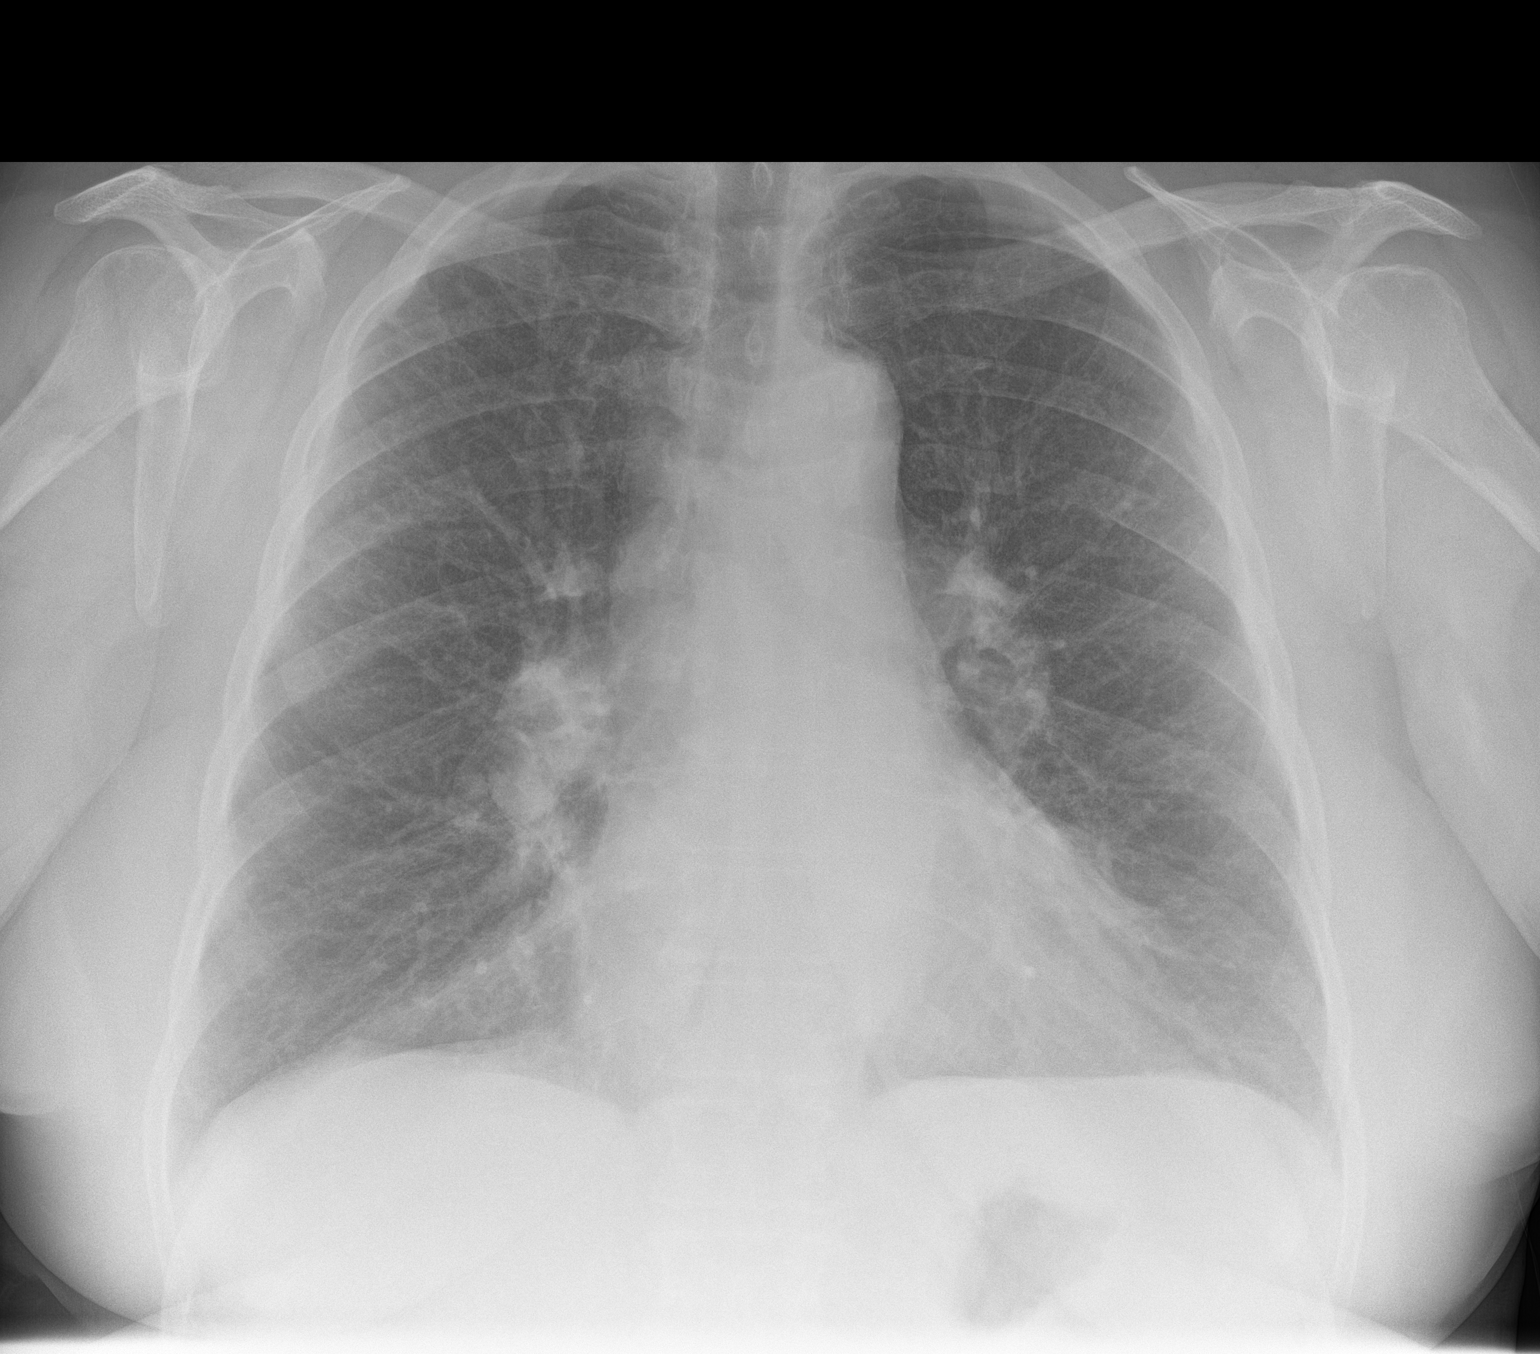
[im 2/2]
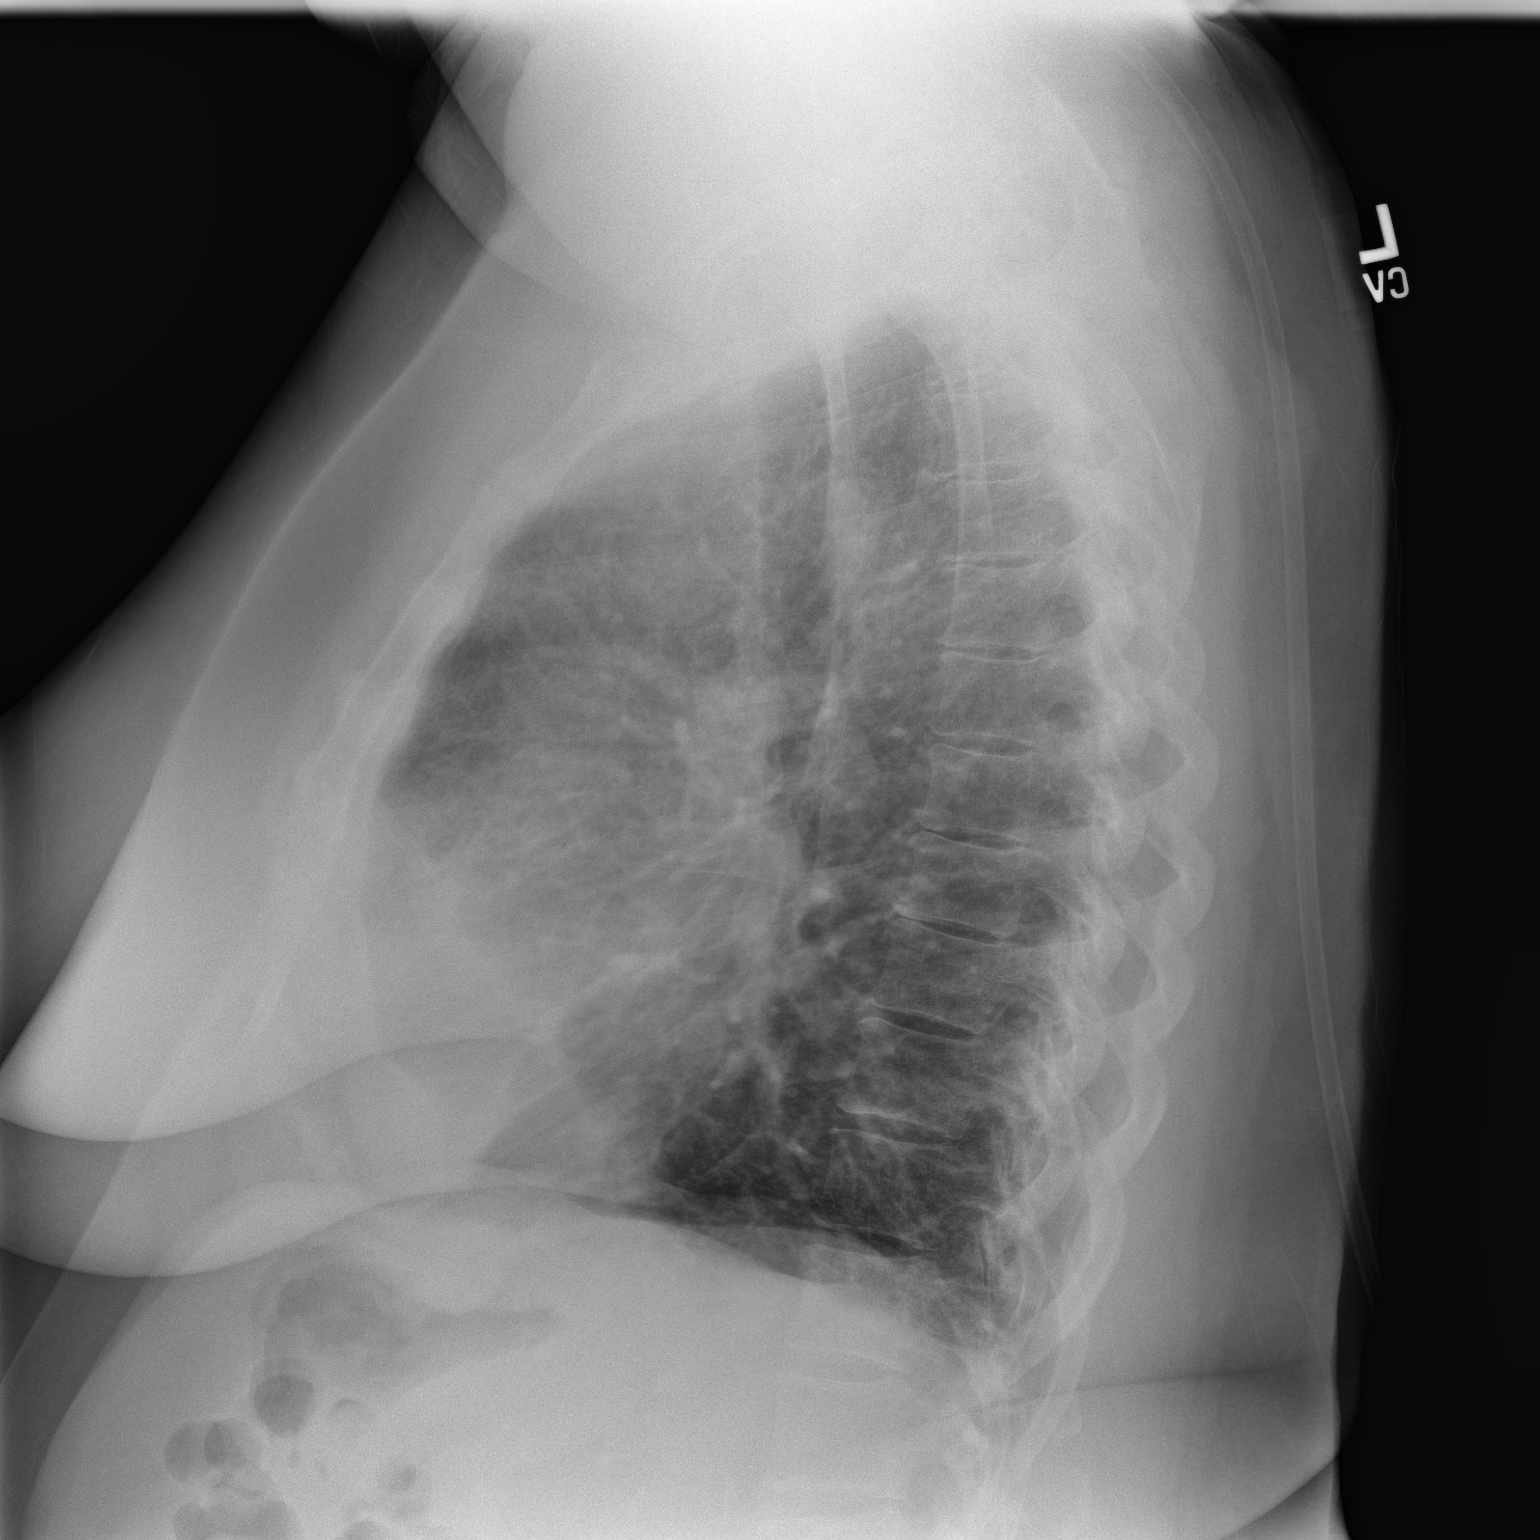

[2 of 2 positions shown; findings below may reference images not displayed]

FINDINGS: There is no focal parenchymal opacity. There is no pleural effusion
or pneumothorax. The heart and mediastinal contours are
unremarkable.

The osseous structures are unremarkable.
IMPRESSION: No active cardiopulmonary disease.

## 2016-05-05 MED ORDER — HYDROCODONE-ACETAMINOPHEN 5-325 MG PO TABS: 1.0000 | ORAL_TABLET | Freq: Three times a day (TID) | ORAL | Status: DC | PRN

## 2016-05-05 NOTE — Patient Instructions (Signed)
You were given one prescription for Hydrocodone today.

## 2016-05-05 NOTE — Patient Instructions (Signed)
Continue weighing daily and call for an overnight weight gain of > 2 pounds or a weekly weight gain of >5 pounds. 

## 2016-05-05 NOTE — Progress Notes (Signed)
Subjective:    Patient ID: Theresa [name], female    DOB: Nov 10, 1955, 60 y.o.   MRN: [id number]  Congestive Heart Failure Presents for follow-up visit. The disease course has been stable. Associated symptoms include edema, fatigue, orthopnea (sleeping in the recliner), palpitations and shortness of breath. Pertinent negatives include no abdominal pain, chest pain or chest pressure. The symptoms have been stable. Past treatments include beta blockers, oxygen and salt and fluid restriction. The treatment provided moderate relief. Compliance with prior treatments has been good. Her past medical history is significant for chronic lung disease and HTN. There is no history of CVA or DM. She has multiple 1st degree relatives with heart disease.  Hypertension This is a chronic problem. The current episode started more than 1 year ago. The problem is unchanged. The problem is controlled. Associated symptoms include anxiety, palpitations, peripheral edema and shortness of breath. Pertinent negatives include no chest pain or headaches. There are no associated agents to hypertension. Risk factors for coronary artery disease include family history, obesity and smoking/tobacco exposure. Past treatments include beta blockers, diuretics and lifestyle changes. The current treatment provides significant improvement. Compliance problems include exercise.  Hypertensive end-organ damage includes heart failure. There is no history of angina or kidney disease.    Past Medical History  Diagnosis Date  . COPD (chronic obstructive pulmonary disease) ([place])   . Hypertension   . Gastritis   . Lumbar radiculitis   . Muscle weakness   . DDD (degenerative disc disease), cervical   . Depression   . Chronic rhinitis   . Chronic generalized abdominal pain   . Anxiety   . Edema   . Carpal tunnel syndrome   . Low back pain   . Hematuria   . Malodorous urine   . Sensory urge incontinence   . Obesity   . Renal cyst   .  Tobacco abuse   . CHF (congestive heart failure) [facility])     Past Surgical History  Procedure Laterality Date  . Tubal ligation    . Abdominal hysterectomy    . Tonsillectomy    . Carpal tunnel release Left     Family History  Problem Relation Age of Onset  . Heart disease Mother   . Stroke Mother   . Coronary artery disease Mother   . Prostate cancer Neg Hx   . Lung cancer Sister   . Alcohol abuse Father   . Heart disease Father     Social History  Substance Use Topics  . Smoking status: Current Every Day Smoker -- 1.00 packs/day for 40 years    Types: Cigarettes  . Smokeless tobacco: Never Used     Comment: 30+ years, pt states she has cut back to 4-5 cigarettes a day  . Alcohol Use: No    Allergies  Allergen Reactions  . Codeine Nausea And Vomiting  . Meloxicam Other (See Comments)    Stomach pain    Prior to Admission medications   Medication Sig Start Date End Date Taking? Authorizing Provider  ADVAIR DISKUS 250-50 MCG/DOSE AEPB Inhale 1 puff into the lungs 2 (two) times daily.  09/13/15   Historical Provider, MD  albuterol (PROVENTIL HFA;VENTOLIN HFA) 108 (90 BASE) MCG/ACT inhaler Inhale 2 puffs into the lungs every 6 (six) hours as needed for wheezing. Reported on 11/03/2015    Historical Provider, MD  ALPRAZolam Duanne Moron) 0.5 MG tablet Take 1 tablet (0.5 mg total) by mouth at bedtime as needed for anxiety. Patient taking differently:  Take 0.5 mg by mouth 2 (two) times daily as needed for anxiety.  07/04/15   Fritzi Mandes, MD  Aspirin-Caffeine (BC FAST PAIN RELIEF ARTHRITIS) 1000-65 MG PACK Take 1 packet by mouth every 6 (six) hours as needed (Pain).    Historical Provider, MD  bisoprolol-hydrochlorothiazide [facility]) 10-6.25 MG tablet TAKE 1 TABLET BY MOUTH DAILY 12/08/15   Vickki Muff Chrismon, PA  Cholecalciferol ([facility] D3) 2000 units CAPS Take 1 capsule by mouth daily.    Historical Provider, MD  furosemide (LASIX) 20 MG tablet 20 mg daily.  12/14/15   Historical Provider, MD   HYDROcodone-acetaminophen (NORCO/VICODIN) 5-325 MG tablet Take 1 tablet by mouth every 8 (eight) hours as needed for severe pain. 05/05/16   Milinda Pointer, MD  ipratropium-albuterol (DUONEB) 0.5-2.5 (3) MG/3ML SOLN Take 3 mLs by nebulization every 6 (six) hours as needed (shortness of breath). 02/23/16   Vickki Muff Chrismon, PA  OXYGEN Inhale 2.5 L into the lungs continuous. Reported on 11/03/2015    Historical Provider, MD  roflumilast (DALIRESP) 500 MCG TABS tablet Take 500 mcg by mouth daily.    Historical Provider, MD  sertraline (ZOLOFT) 100 MG tablet TAKE 1 TABLET (100 MG TOTAL) BY MOUTH DAILY. 04/18/16   Vickki Muff Chrismon, PA  tiotropium (SPIRIVA) 18 MCG inhalation capsule Place 1 capsule (18 mcg total) into inhaler and inhale daily. 07/04/15   Fritzi Mandes, MD     Review of Systems  Constitutional: Positive for fatigue. Negative for appetite change.  HENT: Negative for congestion, postnasal drip, sinus pressure and sore throat.   Eyes: Negative.   Respiratory: Positive for cough (productive) and shortness of breath. Negative for chest tightness.   Cardiovascular: Positive for palpitations and leg swelling. Negative for chest pain.  Gastrointestinal: Negative for abdominal pain and abdominal distention.  Endocrine: Negative.   Genitourinary: Negative.   Musculoskeletal: Positive for back pain and arthralgias (right shoulder).  Allergic/Immunologic: Negative.   Neurological: Negative for dizziness, light-headedness and headaches.  Hematological: Negative for adenopathy. Does not bruise/bleed easily.  Psychiatric/Behavioral: Positive for sleep disturbance (sleeping in recliner with oxygen at 2.5L). Negative for dysphoric mood. The patient is nervous/anxious.        Objective:   Physical Exam  Constitutional: She is oriented to person, place, and time. She appears well-developed and well-nourished.  HENT:  Head: Normocephalic and atraumatic.  Eyes: Conjunctivae are normal. Pupils are  equal, round, and reactive to light.  Neck: Normal range of motion. Neck supple.  Cardiovascular: Normal rate and regular rhythm.   Pulmonary/Chest: Effort normal. She has no wheezes. She has rhonchi in the right upper field. She has no rales.  Abdominal: Soft. She exhibits no distension. There is no tenderness.  Musculoskeletal: She exhibits edema (1+ pitting edema in bilateral lower legs). She exhibits no tenderness.  Neurological: She is alert and oriented to person, place, and time.  Skin: Skin is warm and dry.  Psychiatric: She has a normal mood and affect. Her behavior is normal. Thought content normal.  Nursing note and vitals reviewed.   BP 122/73 mmHg  Pulse 76  Resp 20  Ht _0  (1.575 m)  Wt 260 lb (117.935 kg)  BMI 47.54 kg/m2  SpO2 97%       Assessment & Plan:  1: Chronic heart failure with preserved ejection fraction- Patient presents with fatigue and shortness of breath with minimal exertion (Class III). She does feel like her shortness of breath is worse because of the hot/humid weather and she's been  trying to stay inside as much as possible. She continues to weigh herself and says that she's gained a few pounds since she was last here. By our scale, she's gained 6.6 pounds since she was last here on 02/03/16. Reminded to call for an overnight weight gain of >2 pounds or a weekly weight gain of >5 pounds. She is not adding any salt to her food and is using Mrs[employer] Dash for seasoning. She also tries to eat low sodium foods. Does have some chronic edema in her lower legs and she is already elevating them during the day. Has been at a previous appointment already so her feet have been down all morning.  2: HTN- Blood pressure looks good today. Continue medications at this time. 3: COPD- Patient has some rhonchi in her right upper lobe. She continues to wear her oxygen at 2.5L around the clock along with using her nebulizer and inhalers. Has an appointment with her pulmonologist  either in August or September 2017. 4: Depression- She says that she feels like this is under control at this time. Does endorse some anxiety and her alprazolam can now be taken twice daily if she feels like she needs it. Denies any suicidal thoughts. 5: Tobacco use- Patient says that she smokes about 1 ppd of cigarettes daily and doesn't have any desire to quit. She does confirm that she takes off her oxygen and removes herself from it prior to smoking. Complete cessation was discussed for 3 minutes with her.  Patient did not bring her medications nor a list. Each medication was verbally reviewed with the patient and she was encouraged to bring the bottles to every visit to confirm accuracy of list.  Return in 3 months or sooner for any questions/problems before then.

## 2016-05-05 NOTE — Progress Notes (Signed)
Patient's Name: Theresa [name]  Patient type: Established  MRN: [id number]  Service setting: Ambulatory outpatient  DOB: Apr 12, 1956  Location: [facility]  DOS: 05/05/2016  Primary Care Physician: [name], PA  Note by: Kathlen Brunswick. Dossie Arbour, M.D, DABA, DABAPM, DABPM, DABIPP, FIPP  Referring Physician: Margo Common, PA  Specialty: Board-Certified Interventional Pain Management  Last Visit to Pain Management: 03/01/2016   Primary Reason(s) for Visit: Encounter for prescription drug management (Level of risk: moderate) CC: Back Pain   HPI  [name] is a 60 y.o. year old, female patient, who returns today as an established patient. She has Chronic respiratory acidosis; Neuritis or radiculitis due to rupture of lumbar intervertebral disc; Decreased motor strength; Essential (primary) hypertension; Clinical depression; DDD (degenerative disc disease), lumbar; CAFL (chronic airflow limitation) ([place]); Chronic rhinitis; Carpal tunnel syndrome; Blood clotting disorder ([place]); Anxiety state; Chronic diastolic heart failure ([place]); Chest pain; Tobacco abuse; Chronic pain; Encounter for therapeutic drug level monitoring; Encounter for chronic pain management; Pain management; Neurogenic pain; Neuropathic pain; Musculoskeletal pain; Lumbar spondylosis (L4-5); Chronic low back pain (Location of Primary Source of Pain) (Left); Chronic lower extremity pain (Location of Secondary source of pain) (Left); Chronic lumbar radicular pain (Location of Secondary source of pain) (S1 [place]ermatome) (Left); Chronic shoulder pain (Right side); Chronic upper extremity pain (Right-sided); Cervical radiculitis (Right side); Abnormal x-ray of lumbar spine; Chronic pain syndrome (significant psychosocial component); 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; Chronic obstructive pulmonary disease ([place]); Coagulation disorder ([place]); Chronic hip pain (Location of Primary Source of  Pain) (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); 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); Obesity, Class III, BMI 40-49.9 (morbid obesity) ([place]); 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[place]); Vitamin D deficiency; Long term current use of opiate analgesic; Long term prescription opiate use; Opiate use (15 MME/Day); Lumbar facet syndrome (Location of Primary Source of Pain) (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); Chronic sacroiliac joint pain (Bilateral); and Adiposity on her problem list.. Her primarily concern today is the Back Pain   Pain Assessment: Self-Reported Pain Score: 5  Clinically the patient looks like a 3/10 Reported level is inconsistent with clinical obrservations Information on the proper use of the pain score provided to the patient today. Pain Type: Chronic pain Pain Location: Back Pain Orientation: Lower Pain Descriptors / Indicators:  (excruciating) Pain Frequency: Intermittent  The patient comes into the clinics today for pharmacological management of her chronic pain. I last saw this patient on 03/01/2016. The patient  reports that she does not use illicit drugs. Her body mass index is 44.65 kg/(m^2).   Unfortunately, the patient continues to smoke as I can smell the cigarettes on her. This is despite the fact that she comes in on a wheelchair with an oxygen tank strapped to her due to her COPD. In a similar manner, she has not made significant changes to her weight, which is responsible for a lot of her lower back problems. Once again the patient was reminded that her goal is to quit smoking and lose weight. She has been informed that she has to bring her BMI to less than 30.  Date of Last Visit: 03/01/16 Service Provided on Last Visit: Med Refill  Controlled Substance Pharmacotherapy Assessment & REMS (Risk Evaluation and Mitigation Strategy)  Analgesic: Hydrocodone/APAP 5/325 one tablet every 8 hours (15 mg/day of hydrocodone) MME/day: 15 mg/day Pill Count: Hydrocodone 5/325 mg 0 out of 90 remaining. Filled  03-28-16. Pharmacokinetics: Onset of action (Liberation/Absorption): Within expected pharmacological parameters Time to Peak effect (Distribution): Timing and results  are as within normal expected parameters Duration of action (Metabolism/Excretion): Within normal limits for medication Pharmacodynamics: Analgesic Effect: More than 50% Activity Facilitation: Medication(s) allow patient to sit, stand, walk, and do the basic ADLs Perceived Effectiveness: Described as relatively effective, allowing for increase in activities of daily living (ADL) Side-effects or Adverse reactions: None reported Monitoring: [place] PMP: Online review of the past 51-monthperiod conducted. Compliant with practice rules and regulations Last UDS on record: TOXASSURE SELECT 13  Date Value Ref Range Status  03/01/2016 FINAL  Final    Comment:    ==================================================================== TOXASSURE SELECT 13 (MW) ==================================================================== Test                             Result       Flag       Units Drug Present and Declared for Prescription Verification   Hydrocodone                    863          EXPECTED   ng/mg creat   Hydromorphone                  195          EXPECTED   ng/mg creat   Dihydrocodeine                 175          EXPECTED   ng/mg creat   Norhydrocodone                 1078         EXPECTED   ng/mg creat    Sources of hydrocodone include scheduled prescription    medications. Hydromorphone, dihydrocodeine and norhydrocodone are    expected metabolites of hydrocodone. Hydromorphone and    dihydrocodeine are also available as scheduled prescription    medications. Drug Absent but Declared for Prescription Verification   Alprazolam                     Not Detected UNEXPECTED ng/mg creat ==================================================================== Test                      Result    Flag   Units      Ref Range    Creatinine              40               mg/dL      >=20 ==================================================================== Declared Medications:  The flagging and interpretation on this report are based on the  following declared medications.  Unexpected results may arise from  inaccuracies in the declared medications.  **Note: The testing scope of this panel includes these medications:  Alprazolam (Xanax)  Hydrocodone (Norco)  **Note: The testing scope of this panel does not include following  reported medications:  Acetaminophen (Norco)  Albuterol (Duoneb)  Albuterol (Proventil)  Amoxicillin  Aspirin  Bisoprolol (Ziac)  Caffeine  Cholecalciferol  Fluticasone (Advair)  Furosemide (Lasix)  Hydrochlorothiazide (Ziac)  Ipratropium (Duoneb)  Oxygen  Roflumilast (Daliresp)  Salmeterol (Advair)  Sertraline (Zoloft)  Tiotropium (Spiriva) ==================================================================== For clinical consultation, please call [phone] ====================================================================    UDS interpretation: Compliant Patient informed of the CDC guidelines and recommendations to stay away from the concomitant use of benzodiazepines and opioids due to the increased risk of respiratory depression and death.  Medication Assessment Form: Reviewed. Patient indicates being compliant with therapy Treatment compliance: Compliant Risk Assessment: Aberrant Behavior: None observed today Substance Use Disorder (SUD) Risk Level: High Risk of opioid abuse or dependence: 0.7-3.0% with doses ? 36 MME/day and 6.1-26% with doses ? 120 MME/day. Opioid Risk Tool (ORT) Score: Total Score: 12 High Risk for Opioid Abuse (Score >8) Depression Scale Score: PHQ-2: PHQ-2 Total Score: 0 No depression (0) PHQ-9: PHQ-9 Total Score: 0 No depression (0-4)  Pharmacologic Plan: No change in therapy, at this time  Laboratory Chemistry  Inflammation Markers Lab  Results  Component Value Date   ESRSEDRATE 5 09/17/2015   CRP 1.1* 09/17/2015    Renal Function Lab Results  Component Value Date   BUN 23* 12/29/2015   CREATININE 0.62 12/29/2015   GFRAA >60 12/29/2015   GFRNONAA >60 12/29/2015    Hepatic Function Lab Results  Component Value Date   AST 27 12/29/2015   ALT 28 12/29/2015   ALBUMIN 3.9 12/29/2015    Electrolytes Lab Results  Component Value Date   NA 138 12/29/2015   K 4.0 12/29/2015   CL 101 12/29/2015   CALCIUM 9.9 12/29/2015   MG 2.1 09/17/2015    Pain Modulating Vitamins Lab Results  Component Value Date   25OHVITD1 13* 09/17/2015   25OHVITD2 <1.0 09/17/2015   25OHVITD3 13 09/17/2015    Coagulation Parameters Lab Results  Component Value Date   PLT 153 11/02/2015    Cardiovascular Lab Results  Component Value Date   BNP 29.0 11/02/2015   HGB 15.3 11/02/2015   HCT 46.7 11/02/2015    Note: Lab results reviewed.  Recent Diagnostic Imaging  Dg Cervical Spine Complete  03/07/2016  CLINICAL DATA:  Acute lower neck pain after fall yesterday. Initial encounter. EXAM: CERVICAL SPINE - COMPLETE 4+ VIEW COMPARISON:  CT scan of December 31, 2013. FINDINGS: No fracture or spondylolisthesis is noted. Mild degenerative disc disease is noted at C5-6. Moderate degenerative disc disease is noted at C6-7. IMPRESSION: Multilevel degenerative disc disease is noted in lower cervical spine. No acute abnormality seen in the cervical spine. Electronically Signed   By: Marijo Conception, M.D.   On: 03/07/2016 15:26   Dg Shoulder Right  03/07/2016  CLINICAL DATA:  Fall yesterday, right shoulder EXAM: RIGHT SHOULDER - 2+ VIEW COMPARISON:  12/31/2013 FINDINGS: Three views of the right shoulder submitted. No acute fracture or subluxation. Degenerative changes AC joint. Glenohumeral joint is preserved. IMPRESSION: No acute fracture or subluxation.  Degenerative changes AC joint. Electronically Signed   By: Lahoma Crocker M.D.   On:  03/07/2016 15:16   Cervical Imaging: Cervical MR w/wo contrast:  Results for orders placed in visit on 12/25/07  MR Cervical Spine W Wo Contrast   Narrative * PRIOR REPORT IMPORTED FROM AN EXTERNAL SYSTEM *   PRIOR REPORT IMPORTED FROM THE [facility] WORKFLOW SYSTEM   REASON FOR EXAM:    Abnormal x-ray showing spur impingement on C5-6 on the  left  COMMENTS:   PROCEDURE:     MR  - MR CERVICAL SPINE WO/W  - Dec 25 2007  6:23PM   RESULT:     There is a history of LEFT arm numbness and tingling and  burning  in both arms and hands - worse for the last two weeks.   TECHNIQUE: Multiplanar/multisequence imaging of the cervical spine is  obtained.   Comparison is made to prior plain films of 12/19/2007.   FINDINGS: Multilevel disc degeneration is present. There  is a tiny C6-7  disc  protrusion. This is associated with end-plate osteophyte formation.  Minimal  narrowing of the thecal sac is present. The thecal sac measures  approximately 9.0 mm. Similar findings to a lesser degree are present at  C5-6. Bilateral mild neural foraminal narrowing is present at C3-4  secondary  to end-plate osteophyte formation and mild facetal hypertrophy. Bilateral  C5-6 mild to moderate neural foraminal narrowing is present secondary to  degenerative end-plate osteophyte formation. Gadolinium enhanced images  were  obtained and reveal no focal abnormality. Slight prominence of the central  canal is noted within the cord. No definite syrinx or enhancing cord  abnormality is noted. No focal bony abnormality is noted. The  craniovertebral junction is normal.   IMPRESSION:   1.     Mild diffuse disc degeneration with a small central disc protrusion  at C6-7 with resultant very mild narrowing of the thecal sac at this  level.  The thecal sac is narrowed to approximately 9.0 mm. Similar findings are  noted to a lesser degree at C5-6.  2.     Multifocal mild bilateral neural foraminal narrowing is noted  at  the  neural foramen levels listed above.   Thank you for this opportunity to contribute to the care of your patient.       Cervical DG complete:  Results for orders placed during the hospital encounter of 03/07/16  DG Cervical Spine Complete   Narrative CLINICAL DATA:  Acute lower neck pain after fall yesterday. Initial encounter.  EXAM: CERVICAL SPINE - COMPLETE 4+ VIEW  COMPARISON:  CT scan of December 31, 2013.  FINDINGS: No fracture or spondylolisthesis is noted. Mild degenerative disc disease is noted at C5-6. Moderate degenerative disc disease is noted at C6-7.  IMPRESSION: Multilevel degenerative disc disease is noted in lower cervical spine. No acute abnormality seen in the cervical spine.   Electronically Signed   By: Marijo Conception, M.D.   On: 03/07/2016 15:26    Shoulder Imaging: Shoulder-R MR wo contrast:  Results for orders placed in visit on 04/09/09  MR Shoulder Right Wo Contrast   Narrative * PRIOR REPORT IMPORTED FROM AN EXTERNAL SYSTEM *   PRIOR REPORT IMPORTED FROM THE [facility] WORKFLOW SYSTEM   REASON FOR EXAM:    Rt Shoulder Pain  COMMENTS:  [phone] home or work [phone]   PROCEDURE:     MR  - MR SHOULDER RT  WO CONTRAST  - Apr 09 2009  5:43PM   RESULT:     Multiplanar/multisequence imaging of the RIGHT shoulder was  obtained without the administration of gadolinium.   Evaluation of the osseous structures demonstrates degenerative changes  evident by osteophytosis along the humeral head as well as involving the  acromioclavicular joint.  There is impingement anatomy involving the  acromioclavicular joint. The supraspinatus tendon is diffusely thickened.  There is increased T2 signal along the subacromion and subdeltoid bursal  regions as well as along the articular surface.  Increased T2 signal  projects within the substance of the tendon at the musculotendinous  junction.  There is no evidence of muscle or tendon retraction.  The  biceps   tendon is appreciated within the bicipital groove. The subscapularis is  mildly thickened with fluid appreciated within the tendon sheath. The  infraspinatus and teres minor are intact. Note; along the distal anterior  portion of the supraspinatus tendon an area of horizontally oriented T2  signal appears to traverse the  substance of the tendon.   IMPRESSION:   1. Tendinosis involving the supraspinatus tendon with areas of  intrasubstance tearing as well as areas concerning for high grade partial  thickness versus full thickness tearing particularly along the anterior  distal portion of the tendon.   2. Degenerative changes involving the humeral head and acromioclavicular  joint.   3. Findings also concerning for tendinosis involving the subscapularis  tendon.   Thank you for the opportunity to contribute to the care of your patient.       Thoracic Imaging: Thoracic MR w/wo contrast:  Results for orders placed in visit on 09/30/10  MR Thoracic Spine W Wo Contrast   Narrative * PRIOR REPORT IMPORTED FROM AN EXTERNAL SYSTEM *   PRIOR REPORT IMPORTED FROM THE [facility] WORKFLOW SYSTEM   REASON FOR EXAM:    severe pain with weakness right leg intermittent  COMMENTS:   May transport without cardiac monitor   PROCEDURE:     MR  - MR THORACIC SPINE WO/W  - Sep 30 2010  6:37PM   RESULT:     Pre- and postgadolinium MR imaging of the thoracic spine is  performed emergently. There is patient motion artifact present which  degrades the image quality. The disc spaces and vertebral body heights are  maintained. There is no evidence of marrow edema. No spinal canal or  foraminal stenosis is appreciated. There is no evidence of abnormal  enhancement following intravenous administration of gadolinium. A  paraspinal  mass is not appreciated. No abnormal fluid collection is evident.   IMPRESSION:      No acute abnormality of the thoracic spine.       Lumbosacral Imaging: Lumbar MR wo  contrast:  Results for orders placed during the hospital encounter of 02/23/16  MR Lumbar Spine Wo Contrast   Narrative CLINICAL DATA:  Low back pain and pressure with difficulty walking for more than 6 months. No acute injury or prior relevant surgery.  EXAM: MRI LUMBAR SPINE WITHOUT CONTRAST  TECHNIQUE: Multiplanar, multisequence MR imaging of the lumbar spine was performed. No intravenous contrast was administered.  COMPARISON:  Abdominal pelvic CT 07/21/2014.  Lumbar MRI 06/09/2014.  FINDINGS: Segmentation: Conventional anatomy assumed, with the last open disc space designated L5-S1.  Alignment:  Normal.  Vertebrae: No worrisome osseous lesion, acute fracture or pars defect. Scattered hemangiomas are again noted, largest at T12. There are endplate degenerative changes at L4-5. The visualized sacroiliac joints appear unremarkable.  Conus medullaris: Extends to the L1 level and appears normal.  Paraspinal and other soft tissues: No significant paraspinal findings. Bilateral renal cystic lesions are grossly stable.  Disc levels:  No significant disc space findings from T11-12 through L1-2.  L2-3: Minimal disc bulging with preserved disc height. There is a small right foraminal disc protrusion, best seen on the sagittal images. No L2 nerve root encroachment seen.  L3-4: Mild loss of disc height with mild annular disc bulging, facet and ligamentous hypertrophy. No significant spinal stenosis or nerve root encroachment.  L4-5: Chronic degenerative disc disease with loss of disc height, annular disc bulging and endplate osteophytes. There is a new left foraminal disc extrusion, demonstrating low signal on all pulse sequences, probably vacuum phenomena within the extruded disc fragment. This moderately narrows the left foramen and would be expected to cause left L4 nerve root encroachment. Disc bulging and endplate osteophytes contribute to mild underlying  foraminal narrowing bilaterally. The lateral recesses are patent.  L5-S1: Disc height and hydration are largely  maintained. Mild bilateral facet hypertrophy. No spinal stenosis or nerve root encroachment.  IMPRESSION: 1. Interval development of left foraminal disc extrusion with vacuum phenomenon at L4-5. There is resulting left foraminal narrowing and probable left L4 nerve root encroachment. 2. The findings at the other levels are stable with mild disc bulging and facet hypertrophy. No others significant spinal stenosis or nerve root encroachment.   Electronically Signed   By: Richardean Sale M.D.   On: 02/23/2016 14:08    Lumbar MR wo contrast:  Results for orders placed in visit on 06/09/14  MR L Spine Ltd W/O Cm   Narrative * PRIOR REPORT IMPORTED FROM AN EXTERNAL SYSTEM *   CLINICAL DATA:  Low back and bilateral leg pain with occasional  numbness. Symptoms for 3 years.   EXAM:  MRI LUMBAR SPINE WITHOUT CONTRAST   TECHNIQUE:  Multiplanar, multisequence MR imaging of the lumbar spine was  performed. No intravenous contrast was administered.   COMPARISON:  Plain films lumbar spine 0 is 10/23/2011 and MRI lumbar  spine 09/30/2010.   FINDINGS:  Vertebral body height and alignment are maintained. There is no  worrisome marrow lesion. Scattered hemangiomas are seen, largest in  T12. The conus medullaris is normal in signal and position. Imaged  intra-abdominal contents are unremarkable. The T11-12 and T12-L1  levels are imaged in the sagittal plane only and negative.   L1-2:  Negative.   L2-3:  Negative.   L3-4: Shallow disc bulge without central canal or foraminal  stenosis.   L4-5: Shallow disc bulge endplate spur. The central canal is open.  Neural foramina are mildly narrowed. The appearance is unchanged.   L5-S1: Shallow disc bulge without central canal or foraminal  narrowing.   IMPRESSION:  No change in the appearance of mild spondylosis most notable  at L4-5  where a disc bulge causes mild foraminal narrowing. No nerve root  compression is identified.    Electronically Signed    By: Inge Rise M.D.    On: 06/09/2014 12:21       Lumbar MR w/wo contrast:  Results for orders placed in visit on 09/30/10  MR Lumbar Spine W Wo Contrast   Narrative * PRIOR REPORT IMPORTED FROM AN EXTERNAL SYSTEM *   PRIOR REPORT IMPORTED FROM THE [facility] WORKFLOW SYSTEM   REASON FOR EXAM:    severe pain weakness leg parasthesia intermittent  COMMENTS:   PROCEDURE:     MR  - MR LUMBAR SPINE WO/W  - Sep 30 2010  6:37PM   RESULT:     Pre- and postgadolinium MR imaging of the lumbar spine is  performed emergently. No previous study is available for comparison.   There is diffuse disc bulge at the L3-L4, L4-L5 and L5-S1 levels. There is  mild foraminal narrowing from the diffuse disc bulge bilaterally at the  L5-S1 level slightly more prominent on the left with mild left-sided L4-L5  and foraminal narrowing and moderate right-sided L4-L5 foraminal  narrowing.  Correlate clinically for significance of these findings. Mild foraminal  narrowing is noted on the left at L3-L4. No abnormal enhancement is  demonstrated on the post gadolinium MR images in the T1 axial or sagittal  sequences. The conus medullaris terminates at the L1 vertebral level. No  significant spinal canal stenosis is appreciated although the diffuse  annular disc bulge to cause some flattening of the anterior margins of the  thecal sac most prominently at the L3-L4 and L4-L5 levels.   IMPRESSION:  1. No acute lumbar spine abnormality. Degenerative disc is changes are  noted  at L3-L4, L4-L5 and L5-S1 with some mild spinal canal and mild to moderate  foraminal narrowing as discussed above. No abnormal enhancement evident.       Lumbar DG 2-3 views:  Results for orders placed in visit on 10/23/11  DG Lumbar Spine 2-3 Views   Narrative * PRIOR REPORT IMPORTED FROM AN EXTERNAL  SYSTEM *   PRIOR REPORT IMPORTED FROM THE [facility] [place] FOR EXAM:    FALL, LBP  COMMENTS:   May transport without cardiac monitor   PROCEDURE:     DXR - DXR LUMBAR SPINE AP AND LATERAL  - Oct 23 2011   5:18AM   RESULT:     Lumbar spine images demonstrate a moderately large amount of  retained fecal material especially within the right colon. L4-L5  intervertebral disc space narrowing and prominent hypertrophic  degenerative  endplate spurring is noted. No compression fracture or subluxation is  seen.  Atherosclerotic calcification is noted in the aorta.   IMPRESSION:      L4-L5 DJD. No acute bony abnormality evident.  Atherosclerotic disease present.       Hip Imaging: Hip-R DG 2-3 views:  Results for orders placed during the hospital encounter of 12/29/15  DG HIP UNILAT W OR W/O PELVIS 2-3 VIEWS RIGHT   Narrative CLINICAL DATA:  Pt c/o bilateral hip pain with ambulation x2 weeks, NKI, hx arthritis, no prev surg  EXAM: DG HIP (WITH OR WITHOUT PELVIS) 2-3V RIGHT  COMPARISON:  CT 07/21/2014  FINDINGS: There is no evidence of hip fracture or dislocation. There is no evidence of arthropathy or other focal bone abnormality. Pelvic phleboliths. Iliofemoral arterial calcifications.  IMPRESSION: Negative hip.   Electronically Signed   By: Lucrezia Europe M.D.   On: 12/29/2015 13:10    Hip-L DG 2-3 views:  Results for orders placed during the hospital encounter of 12/29/15  DG HIP UNILAT W OR W/O PELVIS 2-3 VIEWS LEFT   Narrative CLINICAL DATA:  Bilateral hip pain on ambulation for 2 weeks.  EXAM: DG HIP (WITH OR WITHOUT PELVIS) 2-3V LEFT  COMPARISON:  None.  FINDINGS: There is no evidence of hip fracture or dislocation. There are degenerative joint changes of the left hip with narrowed joint space. Degenerative joint changes of the visualized lumbar spine are noted. There pelvic phleboliths.  IMPRESSION: No acute fracture or dislocation.  Degenerative joint changes of the left hip.   Electronically Signed   By: Abelardo Diesel M.D.   On: 12/29/2015 13:10    Knee Imaging: Knee-R DG 4 views:  Results for orders placed in visit on 07/10/15  DG Knee Complete 4 Views Right   Narrative CLINICAL DATA:  Right knee pain and swelling without known injury.  EXAM: RIGHT KNEE - COMPLETE 4+ VIEW  COMPARISON:  June 15, 2010.  FINDINGS: There is no evidence of fracture, dislocation, or joint effusion. There is no evidence of arthropathy or other focal bone abnormality. Soft tissues are unremarkable.  IMPRESSION: Normal right knee.   Electronically Signed   By: Marijo Conception, M.D.   On: 07/10/2015 15:23    Note: Imaging results reviewed.  Meds  The patient has a current medication list which includes the following prescription(s): advair diskus, albuterol, alprazolam, aspirin-caffeine, bisoprolol-hydrochlorothiazide, cholecalciferol, furosemide, hydrocodone-acetaminophen, ipratropium-albuterol, oxygen-helium, roflumilast, sertraline, and tiotropium.  Current Outpatient Prescriptions on File Prior to Visit  Medication Sig  .  ADVAIR DISKUS 250-50 MCG/DOSE AEPB Inhale 1 puff into the lungs 2 (two) times daily.   Marland Kitchen albuterol (PROVENTIL HFA;VENTOLIN HFA) 108 (90 BASE) MCG/ACT inhaler Inhale 2 puffs into the lungs every 6 (six) hours as needed for wheezing. Reported on 11/03/2015  . ALPRAZolam (XANAX) 0.5 MG tablet Take 1 tablet (0.5 mg total) by mouth at bedtime as needed for anxiety. (Patient taking differently: Take 0.5 mg by mouth 2 (two) times daily as needed for anxiety. )  . Aspirin-Caffeine (BC FAST PAIN RELIEF ARTHRITIS) 1000-65 MG PACK Take 1 packet by mouth every 6 (six) hours as needed (Pain).  . bisoprolol-hydrochlorothiazide (ZIAC) 10-6.25 MG tablet TAKE 1 TABLET BY MOUTH DAILY  . Cholecalciferol ([employer] D3) 2000 units CAPS Take 1 capsule by mouth daily.  . furosemide (LASIX) 20 MG tablet 20 mg daily.   Marland Kitchen  ipratropium-albuterol (DUONEB) 0.5-2.5 (3) MG/3ML SOLN Take 3 mLs by nebulization every 6 (six) hours as needed (shortness of breath).  . OXYGEN Inhale 2.5 L into the lungs continuous. Reported on 11/03/2015  . roflumilast (DALIRESP) 500 MCG TABS tablet Take 500 mcg by mouth daily.  . sertraline (ZOLOFT) 100 MG tablet TAKE 1 TABLET (100 MG TOTAL) BY MOUTH DAILY.  Marland Kitchen tiotropium (SPIRIVA) 18 MCG inhalation capsule Place 1 capsule (18 mcg total) into inhaler and inhale daily.   No current facility-administered medications on file prior to visit.    ROS  Constitutional: Denies any fever or chills Gastrointestinal: No reported hemesis, hematochezia, vomiting, or acute GI distress Musculoskeletal: Denies any acute onset joint swelling, redness, loss of ROM, or weakness Neurological: No reported episodes of acute onset apraxia, aphasia, dysarthria, agnosia, amnesia, paralysis, loss of coordination, or loss of consciousness  Allergies  Ms. Dunkleberger is allergic to codeine and meloxicam.  [facility]  Medical:  Ms. Ferrucci  has a past medical history of COPD (chronic obstructive pulmonary disease) ([place]); Hypertension; Gastritis; Lumbar radiculitis; Muscle weakness; DDD (degenerative disc disease), cervical; Depression; Chronic rhinitis; Chronic generalized abdominal pain; Anxiety; Edema; Carpal tunnel syndrome; Low back pain; Hematuria; Malodorous urine; Sensory urge incontinence; Obesity; Renal cyst; Tobacco abuse; and CHF (congestive heart failure) ([place]). Family: family history includes Alcohol abuse in her father; Coronary artery disease in her mother; Heart disease in her father and mother; Lung cancer in her sister; Stroke in her mother. There is no history of Prostate cancer. Surgical:  has past surgical history that includes Tubal ligation; Abdominal hysterectomy; Tonsillectomy; and Carpal tunnel release (Left). Tobacco:  reports that she has been smoking Cigarettes.  She has a 40 pack-year smoking  history. She has never used smokeless tobacco. Alcohol:  reports that she does not drink alcohol. Drug:  reports that she does not use illicit drugs.  Constitutional Exam  Vitals: Blood pressure 110/71, pulse 79, temperature 98.2 F (36.8 C), temperature source Oral, resp. rate 18, height _0  (1.6 m), weight 252 lb (114.306 kg), SpO2 96 %. General appearance: Well nourished, well developed, and well hydrated. In no acute distress Calculated BMI/Body habitus: Body mass index is 44.65 kg/(m^2). (>40 kg/m2) Extreme obesity (Class III) - 254% higher incidence of chronic pain Psych/Mental status: Alert and oriented x 3 (person, place, & time) Eyes: PERLA Respiratory: Oxygen-dependent COPD  Cervical Spine Exam  Inspection: No masses, redness, or swelling Alignment: Symmetrical ROM: Functional: ROM is within functional limits [facility]) Stability: No instability detected Muscle strength & Tone: Functionally intact Sensory: Unimpaired Palpation: No complaints of tenderness  Upper Extremity (UE) Exam    Side: Right upper  extremity  Side: Left upper extremity  Inspection: No masses, redness, swelling, or asymmetry  Inspection: No masses, redness, swelling, or asymmetry  ROM:  ROM:  Functional: ROM is within functional limits [facility])        Functional: ROM is within functional limits [facility])        Muscle strength & Tone: Functionally intact  Muscle strength & Tone: Functionally intact  Sensory: Unimpaired  Sensory: Unimpaired  Palpation: No complaints of tenderness  Palpation: No complaints of tenderness   Thoracic Spine Exam  Inspection: No masses, redness, or swelling Alignment: Symmetrical ROM: Functional: ROM is within functional limits [facility]) Stability: No instability detected Sensory: Unimpaired Muscle strength & Tone: Functionally intact Palpation: No complaints of tenderness  Lumbar Spine Exam  Inspection: No masses, redness, or swelling Alignment: Symmetrical ROM: Functional: ROM  is within functional limits [facility]) Stability: No instability detected Muscle strength & Tone: Functionally intact Sensory: Unimpaired Palpation: No complaints of tenderness Provocative Tests: Lumbar Hyperextension and rotation test: evaluation deferred today       Patrick's Maneuver: evaluation deferred today              Gait & Posture Assessment  Ambulation: Patient came in today in a wheel chair Gait: Limited. Using assistive device to ambulate Posture: WNL   Lower Extremity Exam    Side: Right lower extremity  Side: Left lower extremity  Inspection: No masses, redness, swelling, or asymmetry ROM:  Inspection: No masses, redness, swelling, or asymmetry ROM:  Functional: ROM is within functional limits [facility])        Functional: ROM is within functional limits [facility])        Muscle strength & Tone: Functionally intact  Muscle strength & Tone: Functionally intact  Sensory: Unimpaired  Sensory: Unimpaired  Palpation: No complaints of tenderness  Palpation: No complaints of tenderness   Assessment & Plan  Primary Diagnosis & Pertinent Problem List: The primary encounter diagnosis was Chronic pain. Diagnoses of Chronic low back pain (Location of Primary Source of Pain) (Left), Long term current use of opiate analgesic, Encounter for therapeutic drug level monitoring, and Opiate use (15 MME/Day) were also pertinent to this visit.  Visit Diagnosis: 1. Chronic pain   2. Chronic low back pain (Location of Primary Source of Pain) (Left)   3. Long term current use of opiate analgesic   4. Encounter for therapeutic drug level monitoring   5. Opiate use (15 MME/Day)     Problems updated and reviewed during this visit: Problem  Pulmonary Hypertensive Arterial Disease (Hcc)  Clinical Depression  Chronic Obstructive Pulmonary Disease (Hcc)  Adiposity    Problem-specific Plan(s): No problem-specific assessment & plan notes found for this encounter.  No new assessment & plan notes have  been filed under this hospital service since the last note was generated. Service: Pain Management   Plan of Care   Problem List Items Addressed This Visit      High   Chronic low back pain (Location of Primary Source of Pain) (Left) (Chronic)   Relevant Medications   HYDROcodone-acetaminophen (NORCO/VICODIN) 5-325 MG tablet   Chronic pain - Primary (Chronic)   Relevant Medications   HYDROcodone-acetaminophen (NORCO/VICODIN) 5-325 MG tablet     Medium   Encounter for therapeutic drug level monitoring   Long term current use of opiate analgesic (Chronic)   Opiate use (15 MME/Day) (Chronic)       Pharmacotherapy (Medications Ordered): Meds ordered this encounter  Medications  . HYDROcodone-acetaminophen (NORCO/VICODIN) 5-325 MG tablet  Sig: Take 1 tablet by mouth every 8 (eight) hours as needed for severe pain.    Dispense:  90 tablet    Refill:  0    Do not add this medication to the electronic "Automatic Refill" notification system. Patient may have prescription filled one day early if pharmacy is closed on scheduled refill date. Do not fill until: 05/05/16 To last until: 06/04/16    [facility] & Procedure Ordered: No orders of the defined types were placed in this encounter.    Imaging Ordered: None  Interventional Therapies: Scheduled: None at this time.    Considering: Diagnostic left sided lumbar facet block under fluoroscopic guidance and IV sedation #2. Possible lumbar facet radiofrequency ablation.    PRN Procedures: Diagnostic left sided lumbar facet block under fluoroscopic guidance and IV sedation #2.        Referral(s) or Consult(s): None at this time.  New Prescriptions   No medications on file    Medications administered during this visit: Ms. Trotti had no medications administered during this visit.  Requested PM Follow-up: Return in about 1 month (around 06/05/2016) for Med-Mgmt, (1-Mo).  Future Appointments Date Time Provider  [place]  06/02/2016 9:40 AM Milinda Pointer, MD [facility] None  08/04/2016 10:00 AM [name], FNP [facility] None    Primary Care Physician: [name], PA Location: [facility] Note by: Kathlen Brunswick. Dossie Arbour, M.D, DABA, DABAPM, DABPM, DABIPP, FIPP  Pain Score Disclaimer: We use the NRS-11 scale. This is a self-reported, subjective measurement of pain severity with only modest accuracy. It is used primarily to identify changes within a particular patient. It must be understood that outpatient pain scales are significantly less accurate that those used for research, where they can be applied under ideal controlled circumstances with minimal exposure to variables. In reality, the score is likely to be a combination of pain intensity and pain affect, where pain affect describes the degree of emotional arousal or changes in action readiness caused by the sensory experience of pain. Factors such as social and work situation, setting, emotional state, anxiety levels, expectation, and prior pain experience may influence pain perception and show large inter-individual differences that may also be affected by time variables.  Patient instructions provided during this appointment: Patient Instructions  You were given one prescription for Hydrocodone today.

## 2016-05-05 NOTE — Progress Notes (Signed)
Safety precautions to be maintained throughout the outpatient stay will include: orient to surroundings, keep bed in low position, maintain call bell within reach at all times, provide assistance with transfer out of bed and ambulation.  Hydrocodone 5/325 mg 0 out of 90 remaining. Filled 03-28-16.

## 2016-05-16 IMAGING — CR DG CHEST 2V
1 series · 2 of 2 positions shown · non-contrast
Comparison: Radiograph dated 10/22/2015

CLINICAL DATA: 59-year-old female with shortness of breath

EXAM:
CHEST  2 VIEW

[Series 1: w chest pa · 0.14mm/px · 2 of 2 slices shown]
[im 1/2]
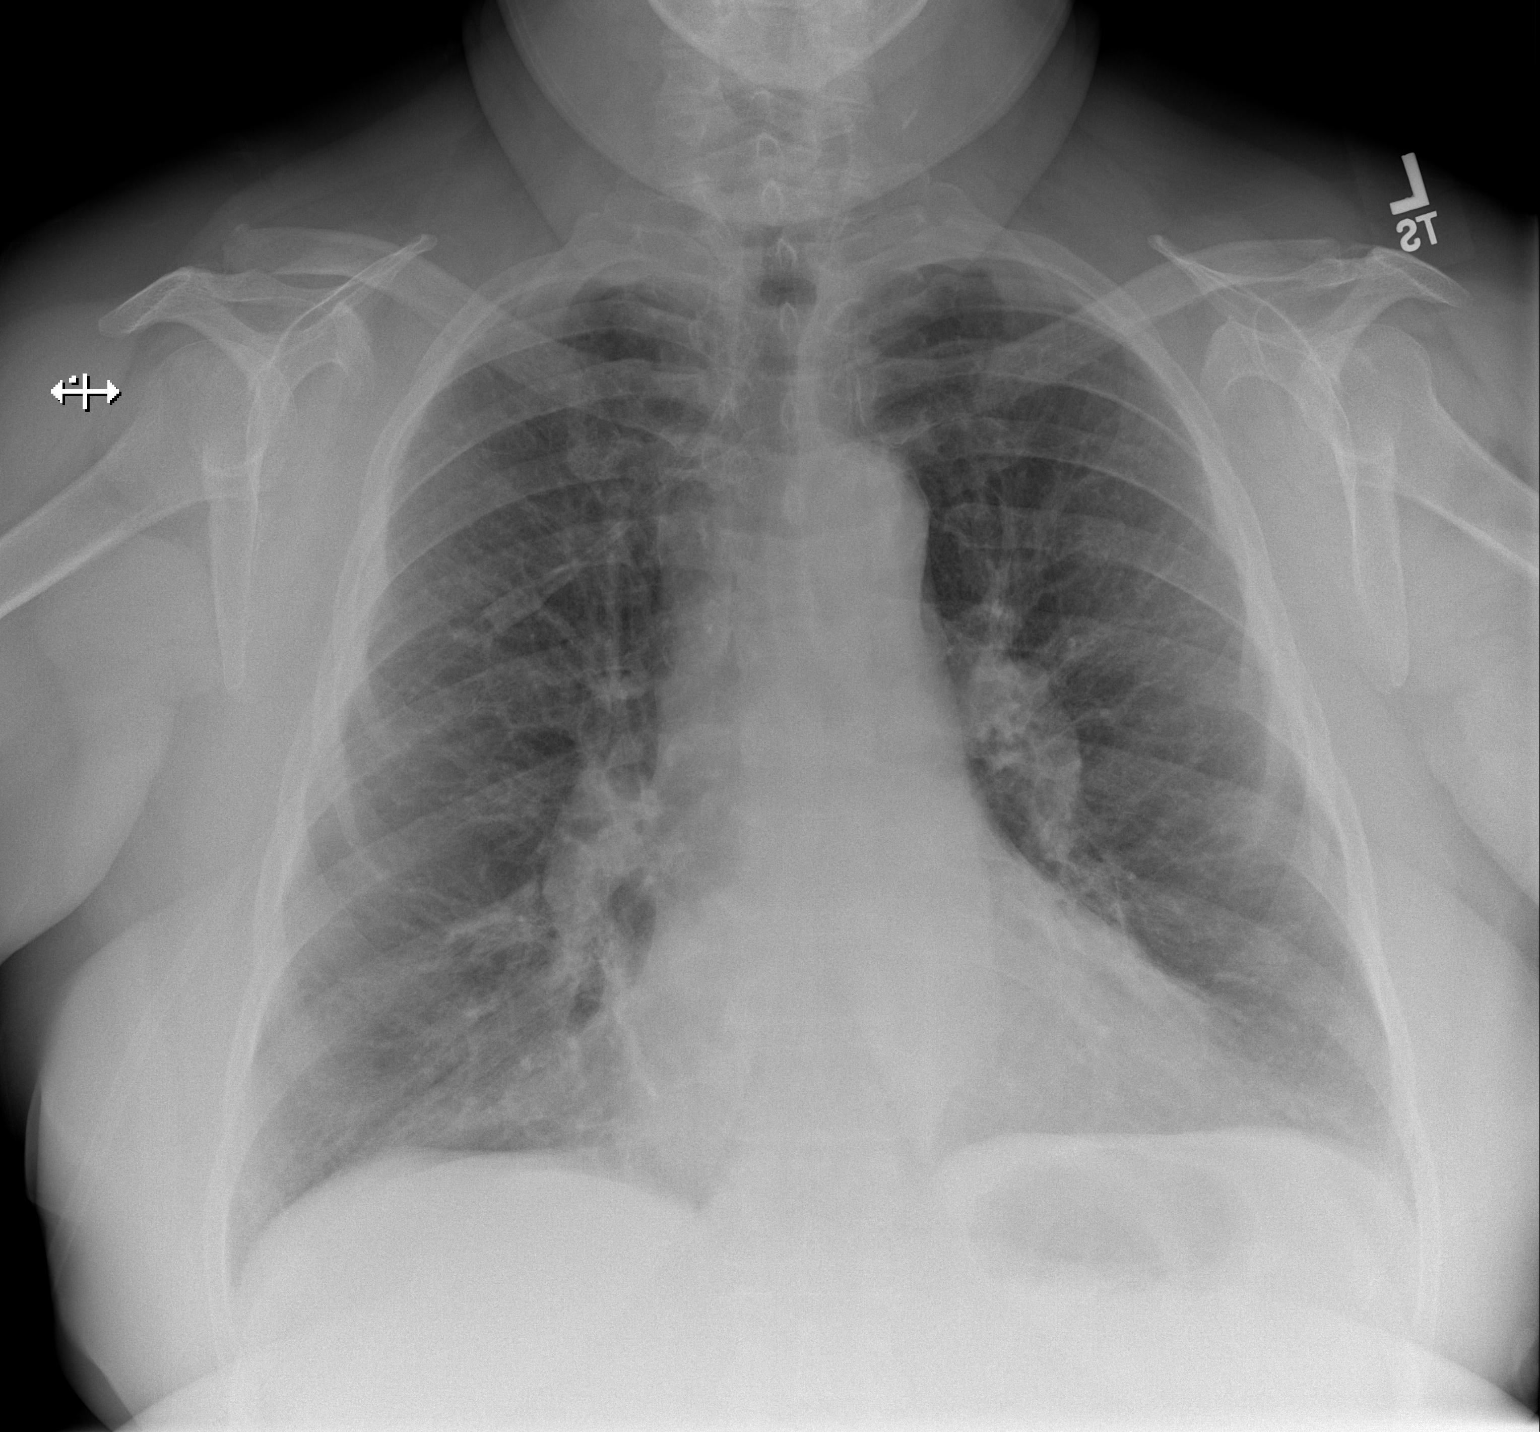
[im 2/2]
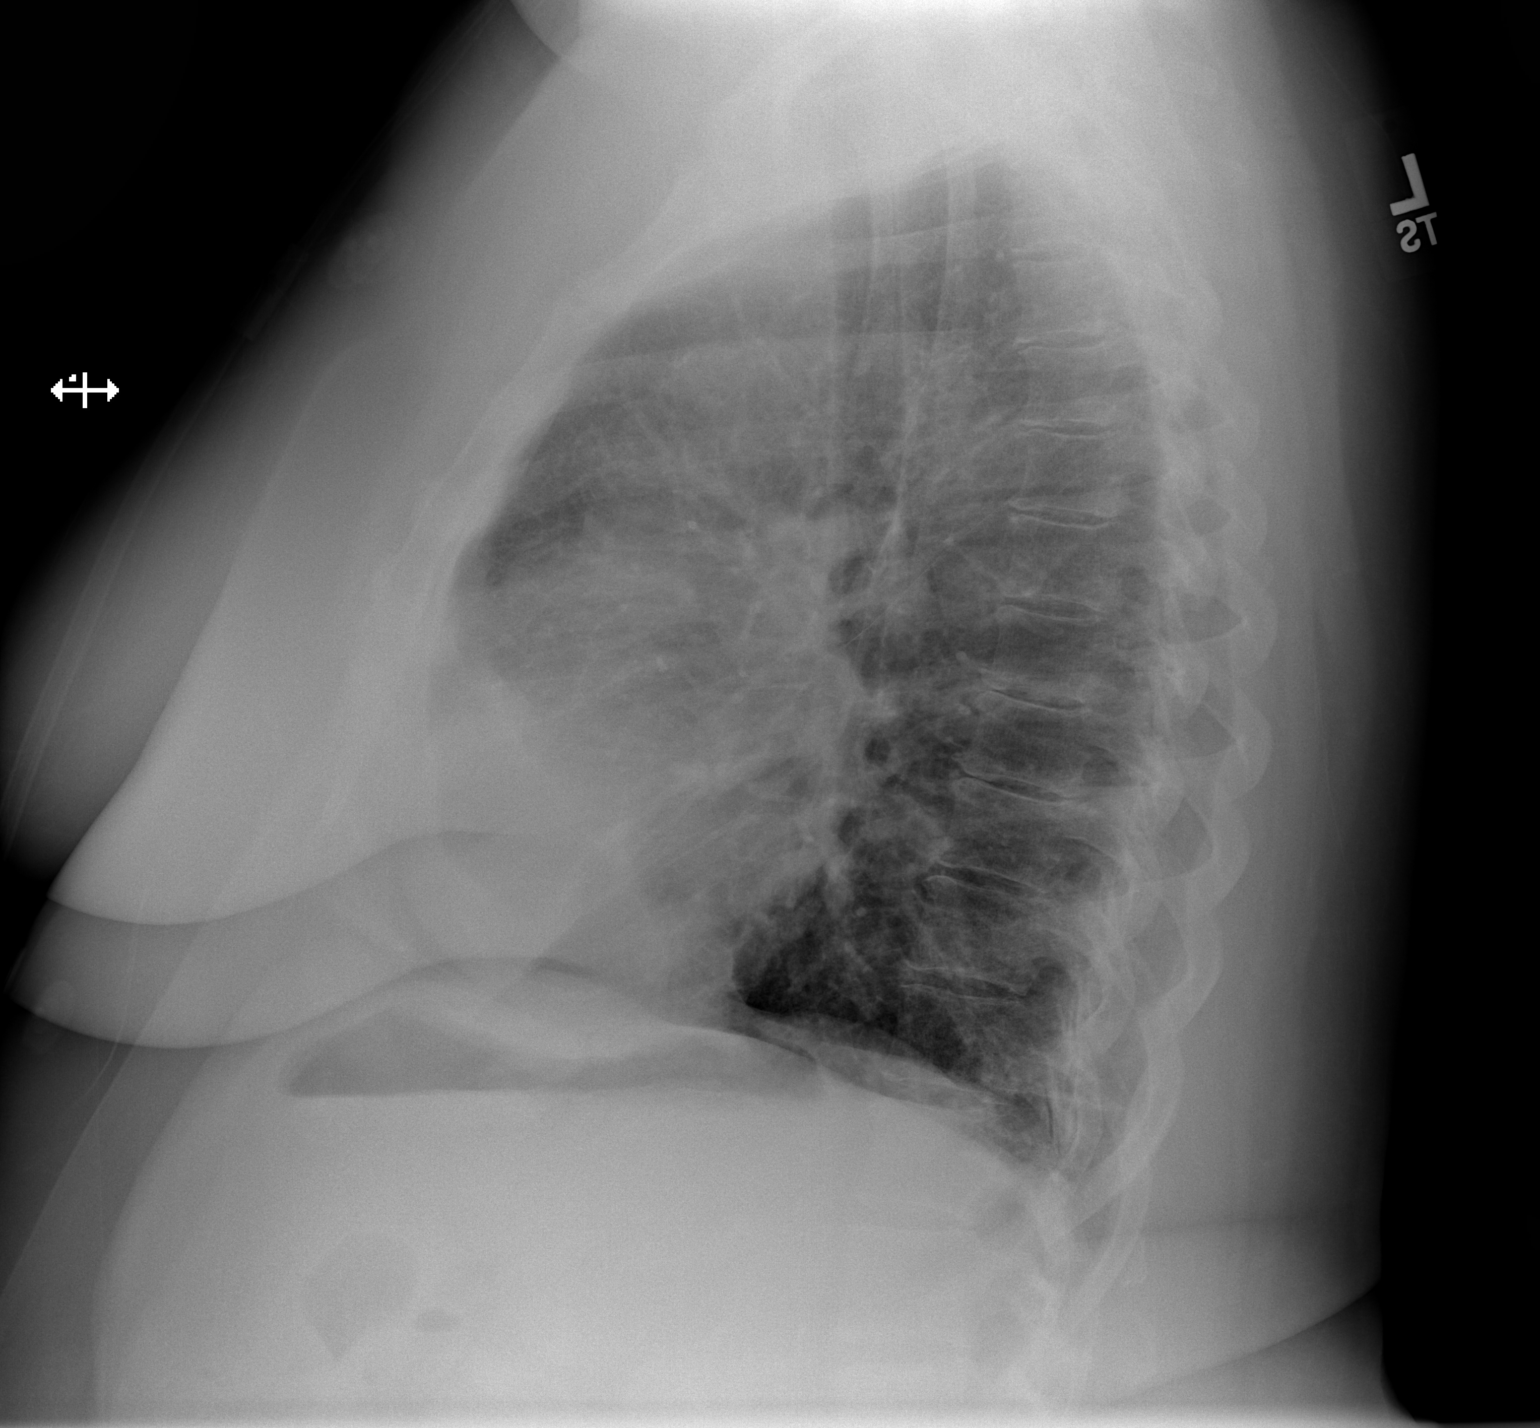

[2 of 2 positions shown; findings below may reference images not displayed]

FINDINGS: The heart size and mediastinal contours are within normal limits.
Both lungs are clear. The visualized skeletal structures are
unremarkable.
IMPRESSION: No active cardiopulmonary disease.

## 2016-06-02 ENCOUNTER — Other Ambulatory Visit: Payer: Self-pay | Admitting: Family Medicine

## 2016-06-02 ENCOUNTER — Encounter: Payer: Self-pay | Admitting: Pain Medicine

## 2016-06-02 ENCOUNTER — Ambulatory Visit: Payer: Medicare PPO | Attending: Pain Medicine | Admitting: Pain Medicine

## 2016-06-02 VITALS — BP 127/73 | HR 73 | Temp 98.6°F | Resp 16 | Ht 62.5 in | Wt 252.0 lb

## 2016-06-02 DIAGNOSIS — M5116 Intervertebral disc disorders with radiculopathy, lumbar region: Secondary | ICD-10-CM | POA: Diagnosis not present

## 2016-06-02 DIAGNOSIS — M79605 Pain in left leg: Secondary | ICD-10-CM | POA: Diagnosis not present

## 2016-06-02 DIAGNOSIS — M545 Low back pain, unspecified: Secondary | ICD-10-CM

## 2016-06-02 DIAGNOSIS — F410 Panic disorder [episodic paroxysmal anxiety] without agoraphobia: Secondary | ICD-10-CM | POA: Insufficient documentation

## 2016-06-02 DIAGNOSIS — F411 Generalized anxiety disorder: Secondary | ICD-10-CM

## 2016-06-02 DIAGNOSIS — I10 Essential (primary) hypertension: Secondary | ICD-10-CM | POA: Diagnosis not present

## 2016-06-02 DIAGNOSIS — G5600 Carpal tunnel syndrome, unspecified upper limb: Secondary | ICD-10-CM | POA: Diagnosis not present

## 2016-06-02 DIAGNOSIS — I5032 Chronic diastolic (congestive) heart failure: Secondary | ICD-10-CM | POA: Insufficient documentation

## 2016-06-02 DIAGNOSIS — M25559 Pain in unspecified hip: Secondary | ICD-10-CM | POA: Insufficient documentation

## 2016-06-02 DIAGNOSIS — J310 Chronic rhinitis: Secondary | ICD-10-CM | POA: Diagnosis not present

## 2016-06-02 DIAGNOSIS — F329 Major depressive disorder, single episode, unspecified: Secondary | ICD-10-CM | POA: Diagnosis not present

## 2016-06-02 DIAGNOSIS — M5416 Radiculopathy, lumbar region: Secondary | ICD-10-CM | POA: Insufficient documentation

## 2016-06-02 DIAGNOSIS — M5412 Radiculopathy, cervical region: Secondary | ICD-10-CM | POA: Insufficient documentation

## 2016-06-02 DIAGNOSIS — M25552 Pain in left hip: Secondary | ICD-10-CM | POA: Insufficient documentation

## 2016-06-02 DIAGNOSIS — F1190 Opioid use, unspecified, uncomplicated: Secondary | ICD-10-CM

## 2016-06-02 DIAGNOSIS — Z79891 Long term (current) use of opiate analgesic: Secondary | ICD-10-CM | POA: Diagnosis not present

## 2016-06-02 DIAGNOSIS — M160 Bilateral primary osteoarthritis of hip: Secondary | ICD-10-CM | POA: Diagnosis not present

## 2016-06-02 DIAGNOSIS — M25512 Pain in left shoulder: Secondary | ICD-10-CM | POA: Diagnosis not present

## 2016-06-02 DIAGNOSIS — M25551 Pain in right hip: Secondary | ICD-10-CM | POA: Diagnosis not present

## 2016-06-02 DIAGNOSIS — J449 Chronic obstructive pulmonary disease, unspecified: Secondary | ICD-10-CM | POA: Insufficient documentation

## 2016-06-02 DIAGNOSIS — F17210 Nicotine dependence, cigarettes, uncomplicated: Secondary | ICD-10-CM | POA: Diagnosis not present

## 2016-06-02 DIAGNOSIS — G4730 Sleep apnea, unspecified: Secondary | ICD-10-CM

## 2016-06-02 DIAGNOSIS — Z6841 Body Mass Index (BMI) 40.0 and over, adult: Secondary | ICD-10-CM | POA: Insufficient documentation

## 2016-06-02 DIAGNOSIS — D7589 Other specified diseases of blood and blood-forming organs: Secondary | ICD-10-CM | POA: Diagnosis not present

## 2016-06-02 DIAGNOSIS — I110 Hypertensive heart disease with heart failure: Secondary | ICD-10-CM | POA: Insufficient documentation

## 2016-06-02 DIAGNOSIS — M79602 Pain in left arm: Secondary | ICD-10-CM

## 2016-06-02 DIAGNOSIS — F32A Depression, unspecified: Secondary | ICD-10-CM

## 2016-06-02 DIAGNOSIS — E559 Vitamin D deficiency, unspecified: Secondary | ICD-10-CM | POA: Insufficient documentation

## 2016-06-02 DIAGNOSIS — M533 Sacrococcygeal disorders, not elsewhere classified: Secondary | ICD-10-CM | POA: Insufficient documentation

## 2016-06-02 DIAGNOSIS — G8929 Other chronic pain: Secondary | ICD-10-CM | POA: Insufficient documentation

## 2016-06-02 DIAGNOSIS — M791 Myalgia: Secondary | ICD-10-CM | POA: Insufficient documentation

## 2016-06-02 DIAGNOSIS — M47816 Spondylosis without myelopathy or radiculopathy, lumbar region: Secondary | ICD-10-CM

## 2016-06-02 DIAGNOSIS — G4733 Obstructive sleep apnea (adult) (pediatric): Secondary | ICD-10-CM | POA: Insufficient documentation

## 2016-06-02 MED ORDER — HYDROCODONE-ACETAMINOPHEN 5-325 MG PO TABS: 1.0000 | ORAL_TABLET | Freq: Three times a day (TID) | ORAL | 0 refills | Status: DC | PRN

## 2016-06-02 NOTE — Progress Notes (Signed)
Patient here for medication management.  Patient states that she has had to take a more medication than prescribed a few times at night to help her sleep d/t the pain. States that medication has not been working as well as it had before.    Patient states that her right shouder continues to bother her from a fall approximately 4 weeks.  She did seek medical attention at the time of fall at an Urgent Care, where they performed x-rays and prescribed Meloxicam and a muscle relaxer of which she can not remember the name.    Hydrocodone 5- 325 mg 2/90 last fill 05/05/16  Safety precautions to be maintained throughout the outpatient stay will include: orient to surroundings, keep bed in low position, maintain call bell within reach at all times, provide assistance with transfer out of bed and ambulation.

## 2016-06-02 NOTE — Progress Notes (Signed)
Patient's Name: Theresa [name]  Patient type: Established  MRN: [id number]  Service setting: Ambulatory outpatient  DOB: Nov 28, 1955  Location: [facility]  DOS: 06/02/2016  Primary Care Physician: [name], PA  Note by: Kathlen Brunswick. Dossie Arbour, M.D  Referring Physician: Margo Common, [place]  Specialty: Interventional Pain Management  Last Visit to Pain Management: 05/05/2016   Primary Reason(s) for Visit: Encounter for prescription drug management (Level of risk: moderate) CC: Back Pain (lower)   HPI  [name] is a 60 y.o. year old, female patient, who returns today as an established patient. She has Neuritis or radiculitis due to rupture of lumbar intervertebral disc; Decreased motor strength; Essential (primary) hypertension; Clinical depression; DDD (degenerative disc disease), lumbar; CAFL (chronic airflow limitation) ([place]); Chronic rhinitis; Carpal tunnel syndrome; Blood clotting disorder ([place]); Anxiety state; Chronic diastolic heart failure ([place]); Chest pain; Tobacco abuse; Chronic pain; Encounter for therapeutic drug level monitoring; Encounter for chronic pain management; Pain management; Neurogenic pain; Neuropathic pain; Musculoskeletal pain; Lumbar spondylosis (L4-5); Chronic low back pain (Location of Primary Source of Pain) (Left); Chronic lower extremity pain (Location of Secondary source of pain) (Left); Chronic lumbar radicular pain (Location of Secondary source of pain) (S1 Dermatome) (Left); Chronic shoulder pain (Right side); Chronic upper extremity pain (Right-sided); Cervical radiculitis (Right side); Abnormal x-ray of lumbar spine; Chronic pain syndrome (significant psychosocial component); 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; Chronic obstructive pulmonary disease ([place]); Coagulation disorder ([place]); Chronic hip pain (Location of Primary Source of Pain) (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); 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); Obesity, Class III,  BMI 40-49.9 (morbid obesity) ([place]); 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[place]); Vitamin D deficiency; Long term current use of opiate analgesic; Long term prescription opiate use; Opiate use (15 MME/Day); Lumbar facet syndrome (Location of Primary Source of Pain) (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); Chronic sacroiliac joint pain (Bilateral); and Sleep apnea on her problem list.. Her primarily concern today is the Back Pain (lower)   Pain Assessment: Self-Reported Pain Score: 5              Reported level is compatible with observation       Pain Type: Chronic pain Pain Location: Back Pain Orientation: Lower Pain Descriptors / Indicators: Nagging, Sharp Pain Frequency: Intermittent  The patient comes into the clinics today for pharmacological management of her chronic pain. I last saw this patient on 05/05/2016. The patient  reports that she does not use drugs. Her body mass index is 45.36 kg/m.  Date of Last Visit: 05/05/16 Service Provided on Last Visit: Med Refill  Controlled Substance Pharmacotherapy Assessment & REMS (Risk Evaluation and Mitigation Strategy)  Analgesic: Hydrocodone/APAP 5/325 one tablet every 8 hours (15 mg/day of hydrocodone) MME/day: 15 mg/day Pill Count: Hydrocodone 5- 325 mg 2/90 last fill 05/05/16. Pharmacokinetics: Onset of action (Liberation/Absorption): Within expected pharmacological parameters Time to Peak effect (Distribution): Timing and results are as within normal expected parameters Duration of action (Metabolism/Excretion): Within normal limits for medication Pharmacodynamics: Analgesic Effect: More than 50% Activity Facilitation: Medication(s) allow patient to sit, stand, walk, and do the basic ADLs Perceived Effectiveness: Described as relatively effective, allowing for increase in activities of daily living (ADL) Side-effects or Adverse reactions: None reported Monitoring: [place] PMP: Online review of the past 23-monthperiod conducted. Compliant with practice  rules and regulations Last UDS on record: [employer]ox[employer] Select 13  Date Value Ref Range Status  03/01/2016 FINAL  Final    Comment:    ==================================================================== TOXASSURE SELECT 13 (MW) ==================================================================== Test  Result       Flag       Units Drug Present and Declared for Prescription Verification   Hydrocodone                    863          EXPECTED   ng/mg creat   Hydromorphone                  195          EXPECTED   ng/mg creat   Dihydrocodeine                 175          EXPECTED   ng/mg creat   Norhydrocodone                 1078         EXPECTED   ng/mg creat    Sources of hydrocodone include scheduled prescription    medications. Hydromorphone, dihydrocodeine and norhydrocodone are    expected metabolites of hydrocodone. Hydromorphone and    dihydrocodeine are also available as scheduled prescription    medications. Drug Absent but Declared for Prescription Verification   Alprazolam                     Not Detected UNEXPECTED ng/mg creat ==================================================================== Test                      Result    Flag   Units      Ref Range   Creatinine              40               mg/dL      >=20 ==================================================================== Declared Medications:  The flagging and interpretation on this report are based on the  following declared medications.  Unexpected results may arise from  inaccuracies in the declared medications.  **Note: The testing scope of this panel includes these medications:  Alprazolam (Xanax)  Hydrocodone (Norco)  **Note: The testing scope of this panel does not include following  reported medications:  Acetaminophen (Norco)  Albuterol (Duoneb)  Albuterol (Proventil)  Amoxicillin  Aspirin  Bisoprolol (Ziac)  Caffeine  Cholecalciferol  Fluticasone (Advair)  Furosemide  (Lasix)  Hydrochlorothiazide (Ziac)  Ipratropium (Duoneb)  Oxygen  Roflumilast (Daliresp)  Salmeterol (Advair)  Sertraline (Zoloft)  Tiotropium (Spiriva) ==================================================================== For clinical consultation, please call [phone]. ====================================================================    UDS interpretation: Compliant          Medication Assessment Form: Reviewed. Patient indicates being compliant with therapy Treatment compliance: Compliant Risk Assessment: Aberrant Behavior: None observed today Substance Use Disorder (SUD) Risk Level: Low-to-moderate Risk of opioid abuse or dependence: 0.7-3.0% with doses ? 36 MME/day and 6.1-26% with doses ? 120 MME/day. Opioid Risk Tool (ORT) Score: Total Score: 2 Low Risk for SUD (Score <3) Depression Scale Score: PHQ-2: PHQ-2 Total Score: 3 38.4% Probability of major depressive disorder (3) PHQ-9: PHQ-9 Total Score: 15 Moderately severe depression (15-19) (Psychiatry referral ordered today.)  Pharmacologic Plan: No change in therapy, at this time  Laboratory Chemistry  Inflammation Markers Lab Results  Component Value Date   ESRSEDRATE 5 09/17/2015   CRP 1.1 (H) 09/17/2015    Renal Function Lab Results  Component Value Date   BUN 23 (H) 12/29/2015   CREATININE 0.62 12/29/2015   GFRAA >60 12/29/2015   GFRNONAA >60 12/29/2015  Hepatic Function Lab Results  Component Value Date   AST 27 12/29/2015   ALT 28 12/29/2015   ALBUMIN 3.9 12/29/2015    Electrolytes Lab Results  Component Value Date   NA 138 12/29/2015   K 4.0 12/29/2015   CL 101 12/29/2015   CALCIUM 9.9 12/29/2015   MG 2.1 09/17/2015    Pain Modulating Vitamins Lab Results  Component Value Date   25OHVITD1 13 (L) 09/17/2015   25OHVITD2 <1.0 09/17/2015   25OHVITD3 13 09/17/2015    Coagulation Parameters Lab Results  Component Value Date   PLT 153 11/02/2015    Cardiovascular Lab  Results  Component Value Date   BNP 29.0 11/02/2015   HGB 15.3 11/02/2015   HCT 46.7 11/02/2015    Note: Lab results reviewed.  Recent Diagnostic Imaging  Dg Cervical Spine Complete  Result Date: 03/07/2016 CLINICAL DATA:  Acute lower neck pain after fall yesterday. Initial encounter. EXAM: CERVICAL SPINE - COMPLETE 4+ VIEW COMPARISON:  CT scan of December 31, 2013. FINDINGS: No fracture or spondylolisthesis is noted. Mild degenerative disc disease is noted at C5-6. Moderate degenerative disc disease is noted at C6-7. IMPRESSION: Multilevel degenerative disc disease is noted in lower cervical spine. No acute abnormality seen in the cervical spine. Electronically Signed   By: Marijo Conception, M.D.   On: 03/07/2016 15:26   Dg Shoulder Right  Result Date: 03/07/2016 CLINICAL DATA:  Fall yesterday, right shoulder EXAM: RIGHT SHOULDER - 2+ VIEW COMPARISON:  12/31/2013 FINDINGS: Three views of the right shoulder submitted. No acute fracture or subluxation. Degenerative changes AC joint. Glenohumeral joint is preserved. IMPRESSION: No acute fracture or subluxation.  Degenerative changes AC joint. Electronically Signed   By: Lahoma Crocker M.D.   On: 03/07/2016 15:16    Meds  The patient has a current medication list which includes the following prescription(s): advair diskus, albuterol, alprazolam, aspirin-caffeine, bisoprolol-hydrochlorothiazide, cholecalciferol, vitamin d3, fluticasone-salmeterol, furosemide, furosemide, hydrocodone-acetaminophen, hydrocodone-acetaminophen, ipratropium-albuterol, oxygen-helium, roflumilast, sertraline, tiotropium, alprazolam, estradiol, meloxicam, metolazone, orphenadrine, potassium chloride sa, and vitamin d (ergocalciferol).  Current Outpatient Prescriptions on File Prior to Visit  Medication Sig  . ADVAIR DISKUS 250-50 MCG/DOSE AEPB Inhale 1 puff into the lungs 2 (two) times daily.   Marland Kitchen albuterol (PROVENTIL HFA;VENTOLIN HFA) 108 (90 BASE) MCG/ACT inhaler Inhale 2  puffs into the lungs every 6 (six) hours as needed for wheezing. Reported on 11/03/2015  . ALPRAZolam (XANAX) 0.5 MG tablet Take 1 tablet (0.5 mg total) by mouth at bedtime as needed for anxiety. (Patient taking differently: Take 0.5 mg by mouth 2 (two) times daily as needed for anxiety. )  . Aspirin-Caffeine (BC FAST PAIN RELIEF ARTHRITIS) 1000-65 MG PACK Take 1 packet by mouth every 6 (six) hours as needed (Pain).  . Cholecalciferol ([employer] D3) 2000 units CAPS Take 1 capsule by mouth daily.  . furosemide (LASIX) 20 MG tablet 20 mg daily.   Marland Kitchen ipratropium-albuterol (DUONEB) 0.5-2.5 (3) MG/3ML SOLN Take 3 mLs by nebulization every 6 (six) hours as needed (shortness of breath).  . OXYGEN Inhale 2.5 L into the lungs continuous. Reported on 11/03/2015  . roflumilast (DALIRESP) 500 MCG TABS tablet Take 500 mcg by mouth daily.  . sertraline (ZOLOFT) 100 MG tablet TAKE 1 TABLET (100 MG TOTAL) BY MOUTH DAILY.  Marland Kitchen tiotropium (SPIRIVA) 18 MCG inhalation capsule Place 1 capsule (18 mcg total) into inhaler and inhale daily.   No current facility-administered medications on file prior to visit.     ROS  Constitutional: Denies any  fever or chills Gastrointestinal: No reported hemesis, hematochezia, vomiting, or acute GI distress Musculoskeletal: Denies any acute onset joint swelling, redness, loss of ROM, or weakness Neurological: No reported episodes of acute onset apraxia, aphasia, dysarthria, agnosia, amnesia, paralysis, loss of coordination, or loss of consciousness  Allergies  Ms. Abdulla is allergic to codeine and meloxicam.  [place]  Medical:  Ms. Strauss  has a past medical history of Anxiety; Carpal tunnel syndrome; CHF (congestive heart failure) ([place]); Chronic generalized abdominal pain; Chronic rhinitis; COPD (chronic obstructive pulmonary disease) ([facility]); DDD (degenerative disc disease), cervical; Depression; Edema; Gastritis; Hematuria; Hypertension; Low back pain; Lumbar radiculitis; Malodorous urine;  Muscle weakness; Obesity; Renal cyst; Sensory urge incontinence; and Tobacco abuse. Family: family history includes Alcohol abuse in her father; Coronary artery disease in her mother; Heart disease in her father and mother; Lung cancer in her sister; Stroke in her mother. Surgical:  has a past surgical history that includes Tubal ligation; Abdominal hysterectomy; Tonsillectomy; and Carpal tunnel release (Left). Tobacco:  reports that she has been smoking Cigarettes.  She has a 40.00 pack-year smoking history. She has never used smokeless tobacco. Alcohol:  reports that she does not drink alcohol. Drug:  reports that she does not use drugs.  Constitutional Exam  Vitals: Blood pressure 127/73, pulse 73, temperature 98.6 F (37 C), temperature source Oral, resp. rate 16, height 5' 2.5" (1.588 m), weight 252 lb (114.3 kg), SpO2 96 %. General appearance: Well nourished, well developed, and well hydrated. In no acute distress Calculated BMI/Body habitus: Body mass index is 45.36 kg/m.       Psych/Mental status: Alert and oriented x 3 (person, place, & time) Eyes: PERLA Respiratory: No evidence of acute respiratory distress  Cervical Spine Exam  Inspection: No masses, redness, or swelling Alignment: Symmetrical Functional ROM: ROM appears unrestricted Stability: No instability detected Muscle strength & Tone: Functionally intact Sensory: Unimpaired Palpation: Non-contributory  Upper Extremity (UE) Exam    Side: Right upper extremity  Side: Left upper extremity  Inspection: No masses, redness, swelling, or asymmetry  Inspection: No masses, redness, swelling, or asymmetry  Functional ROM: ROM appears unrestricted  Functional ROM: ROM appears unrestricted  Muscle strength & Tone: Functionally intact  Muscle strength & Tone: Functionally intact  Sensory: Unimpaired  Sensory: Unimpaired  Palpation: Non-contributory  Palpation: Non-contributory   Thoracic Spine Exam  Inspection: No masses,  redness, or swelling Alignment: Symmetrical Functional ROM: ROM appears unrestricted Stability: No instability detected Sensory: Unimpaired Muscle strength & Tone: Functionally intact Palpation: Non-contributory  Lumbar Spine Exam  Inspection: No masses, redness, or swelling Alignment: Symmetrical Functional ROM: Limited ROM Stability: No instability detected Muscle strength & Tone: Functionally intact Sensory: Movement-associated pain Palpation: Complains of area being tender to palpation Provocative Tests: Lumbar Hyperextension and rotation test: Positive bilaterally for facet joint pain. Patrick's Maneuver: evaluation deferred today              Gait & Posture Assessment  Ambulation: Unassisted Gait: Relatively normal for age and body habitus Posture: WNL   Lower Extremity Exam    Side: Right lower extremity  Side: Left lower extremity  Inspection: No masses, redness, swelling, or asymmetry  Inspection: No masses, redness, swelling, or asymmetry  Functional ROM: ROM appears unrestricted  Functional ROM: ROM appears unrestricted  Muscle strength & Tone: Functionally intact  Muscle strength & Tone: Functionally intact  Sensory: Unimpaired  Sensory: Unimpaired  Palpation: Non-contributory  Palpation: Non-contributory    Assessment & Plan  Primary Diagnosis & Pertinent Problem  List: The primary encounter diagnosis was Chronic pain. Diagnoses of Chronic low back pain (Location of Primary Source of Pain) (Left), Chronic hip pain, unspecified laterality, Chronic pain of lower extremity, left, Chronic lumbar radicular pain (Location of Secondary source of pain) (S1 Dermatome) (Left), Lumbar facet syndrome (Location of Primary Source of Pain) (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), Long term current use of opiate analgesic, Opiate use (15 MME/Day), Sleep apnea, Clinical depression, and Anxiety state were also pertinent to this visit.  Visit Diagnosis: 1. Chronic pain   2. Chronic low back pain (Location  of Primary Source of Pain) (Left)   3. Chronic hip pain, unspecified laterality   4. Chronic pain of lower extremity, left   5. Chronic lumbar radicular pain (Location of Secondary source of pain) (S1 Dermatome) (Left)   6. Lumbar facet syndrome (Location of Primary Source of Pain) (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)   7. Long term current use of opiate analgesic   8. Opiate use (15 MME/Day)   9. Sleep apnea   10. Clinical depression   11. Anxiety state     Problems updated and reviewed during this visit: Problem  Sleep Apnea  Clinical Depression  Anxiety State   Overview:  Still having some recurrence despite using Sertraline once a day. Has no been on the Buspar advised at the time of discharge from the hospital. Will make it available and encouraged to recheck prn.     Problem-specific Plan(s): No problem-specific Assessment & Plan notes found for this encounter.  No new Assessment & Plan notes have been filed under this hospital service since the last note was generated. Service: Pain Management   Plan of Care   Problem List Items Addressed This Visit      High   Chronic hip pain (Location of Primary Source of Pain) (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) (Chronic)   Relevant Medications   meloxicam (MOBIC) 15 MG tablet   orphenadrine (NORFLEX) 100 MG tablet   HYDROcodone-acetaminophen (NORCO/VICODIN) 5-325 MG tablet   HYDROcodone-acetaminophen (NORCO/VICODIN) 5-325 MG tablet   Chronic low back pain (Location of Primary Source of Pain) (Left) (Chronic)   Relevant Medications   meloxicam (MOBIC) 15 MG tablet   orphenadrine (NORFLEX) 100 MG tablet   HYDROcodone-acetaminophen (NORCO/VICODIN) 5-325 MG tablet   HYDROcodone-acetaminophen (NORCO/VICODIN) 5-325 MG tablet   Other Relevant Orders   LUMBAR FACET(MEDIAL BRANCH NERVE BLOCK) MBNB   Chronic lower extremity pain (Location of Secondary source of pain) (Left) (Chronic)   Chronic lumbar radicular pain (Location of Secondary source of pain) (S1 Dermatome)  (Left) (Chronic)   Chronic pain - Primary (Chronic)   Relevant Medications   meloxicam (MOBIC) 15 MG tablet   orphenadrine (NORFLEX) 100 MG tablet   HYDROcodone-acetaminophen (NORCO/VICODIN) 5-325 MG tablet   HYDROcodone-acetaminophen (NORCO/VICODIN) 5-325 MG tablet   Lumbar facet syndrome (Location of Primary Source of Pain) (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) (Chronic)   Relevant Medications   meloxicam (MOBIC) 15 MG tablet   orphenadrine (NORFLEX) 100 MG tablet   HYDROcodone-acetaminophen (NORCO/VICODIN) 5-325 MG tablet   HYDROcodone-acetaminophen (NORCO/VICODIN) 5-325 MG tablet   Other Relevant Orders   LUMBAR FACET(MEDIAL BRANCH NERVE BLOCK) MBNB     Medium   Long term current use of opiate analgesic (Chronic)   Opiate use (15 MME/Day) (Chronic)   Sleep apnea (Chronic)     Low   Anxiety state   Relevant Medications   ALPRAZolam (XANAX) 0.5 MG tablet   Other Relevant Orders   Ambulatory referral to Psychiatry   Clinical depression   Relevant Medications   ALPRAZolam (  XANAX) 0.5 MG tablet   Other Relevant Orders   Ambulatory referral to Psychiatry    Other Visit Diagnoses   None.      Pharmacotherapy (Medications Ordered): Meds ordered this encounter  Medications  . HYDROcodone-acetaminophen (NORCO/VICODIN) 5-325 MG tablet    Sig: Take 1 tablet by mouth every 8 (eight) hours as needed for severe pain.    Dispense:  90 tablet    Refill:  0    Do not add this medication to the electronic "Automatic Refill" notification system. Patient may have prescription filled one day early if pharmacy is closed on scheduled refill date. Do not fill until: 06/04/16 To last until: 07/04/16  . HYDROcodone-acetaminophen (NORCO/VICODIN) 5-325 MG tablet    Sig: Take 1 tablet by mouth every 8 (eight) hours as needed for severe pain.    Dispense:  90 tablet    Refill:  0    Do not add this medication to the electronic "Automatic Refill" notification system. Patient may have prescription filled one day  early if pharmacy is closed on scheduled refill date. Do not fill until: 07/04/16 To last until: 08/03/16    [facility] & Procedure Ordered: Orders Placed This Encounter  Procedures  . LUMBAR FACET(MEDIAL BRANCH NERVE BLOCK) MBNB  . Ambulatory referral to Psychiatry    Imaging Ordered: AMB REFERRAL TO PSYCHIATRY  Interventional Therapies: Scheduled:  Diagnostic bilateral lumbar facet block under fluoro and sedation.    Considering: Possible lumbar facet radiofrequency ablation.    PRN Procedures: Diagnostic left sided lumbar facet block under fluoroscopic guidance and IV sedation.   Referral(s) or Consult(s): None at this time.  New Prescriptions   No medications on file    Medications administered during this visit: Ms. Gemmill had no medications administered during this visit.  Requested PM Follow-up: Return in about 2 months (around 08/02/2016) for Med-Mgmt, In addition, Schedule Procedure, (ASAP).  Future Appointments Date Time Provider [place]  06/14/2016 11:30 AM [place], [place] [facility] None  08/01/2016 10:40 AM Milinda Pointer, MD [facility] None  08/04/2016 10:00 AM [name], FNP [facility] None    Primary Care Physician: [name], PA Location: [facility] Note by: Kathlen Brunswick. Dossie Arbour, M.D, DABA, DABAPM, DABPM, DABIPP, FIPP  Pain Score Disclaimer: We use the NRS-11 scale. This is a self-reported, subjective measurement of pain severity with only modest accuracy. It is used primarily to identify changes within a particular patient. It must be understood that outpatient pain scales are significantly less accurate that those used for research, where they can be applied under ideal controlled circumstances with minimal exposure to variables. In reality, the score is likely to be a combination of pain intensity and pain affect, where pain affect describes the degree of emotional arousal or changes in action  readiness caused by the sensory experience of pain. Factors such as social and work situation, setting, emotional state, anxiety levels, expectation, and prior pain experience may influence pain perception and show large inter-individual differences that may also be affected by time variables.  Patient instructions provided during this appointment: Patient Instructions   Facet Blocks Patient Information  Description: The facets are joints in the spine between the vertebrae.  Like any joints in the body, facets can become irritated and painful.  Arthritis can also effect the facets.  By injecting steroids and local anesthetic in and around these joints, we can temporarily block the nerve supply to them.  Steroids act directly on irritated nerves and tissues  to reduce selling and inflammation which often leads to decreased pain.  Facet blocks may be done anywhere along the spine from the neck to the low back depending upon the location of your pain.   After numbing the skin with local anesthetic (like Novocaine), a small needle is passed onto the facet joints under x-ray guidance.  You may experience a sensation of pressure while this is being done.  The entire block usually lasts about 15-25 minutes.   Conditions which may be treated by facet blocks:   Low back/buttock pain  Neck/shoulder pain  Certain types of headaches  Preparation for the injection:  1. Do not eat any solid food or dairy products within 8 hours of your appointment. 2. You may drink clear liquid up to 3 hours before appointment.  Clear liquids include water, black coffee, juice or soda.  No milk or cream please. 3. You may take your regular medication, including pain medications, with a sip of water before your appointment.  Diabetics should hold regular insulin (if taken separately) and take 1/2 normal NPH dose the morning of the procedure.  Carry some sugar containing items with you to your appointment. 4. A driver must  accompany you and be prepared to drive you home after your procedure. 5. Bring all your current medications with you. 6. An IV may be inserted and sedation may be given at the discretion of the physician. 7. A blood pressure cuff, EKG and other monitors will often be applied during the procedure.  Some patients may need to have extra oxygen administered for a short period. 8. You will be asked to provide medical information, including your allergies and medications, prior to the procedure.  We must know immediately if you are taking blood thinners (like Coumadin/Warfarin) or if you are allergic to IV iodine contrast (dye).  We must know if you could possible be pregnant.  Possible side-effects:   Bleeding from needle site  Infection (rare, may require surgery)  Nerve injury (rare)  Numbness & tingling (temporary)  Difficulty urinating (rare, temporary)  Spinal headache (a headache worse with upright posture)  Light-headedness (temporary)  Pain at injection site (serveral days)  Decreased blood pressure (rare, temporary)  Weakness in arm/leg (temporary)  Pressure sensation in back/neck (temporary)   Call if you experience:   Fever/chills associated with headache or increased back/neck pain  Headache worsened by an upright position  New onset, weakness or numbness of an extremity below the injection site  Hives or difficulty breathing (go to the emergency room)  Inflammation or drainage at the injection site(s)  Severe back/neck pain greater than usual  New symptoms which are concerning to you  Please note:  Although the local anesthetic injected can often make your back or neck feel good for several hours after the injection, the pain will likely return. It takes 3-7 days for steroids to work.  You may not notice any pain relief for at least one week.  If effective, we will often do a series of 2-3 injections spaced 3-6 weeks apart to maximally decrease your pain.   After the initial series, you may be a candidate for a more permanent nerve block of the facets.  If you have any questions, please call #[phone] [place]  What are the risk, side effects and possible complications? Generally speaking, most procedures are safe.  However, with any procedure there are risks, side effects, and the possibility of complications.  The risks and complications are dependent upon the sites that are lesioned, or the type of nerve block to be performed.  The closer the procedure is to the spine, the more serious the risks are.  Great care is taken when placing the radio frequency needles, block needles or lesioning probes, but sometimes complications can occur. 1. Infection: Any time there is an injection through the skin, there is a risk of infection.  This is why sterile conditions are used for these blocks.  There are four possible types of infection. 1. Localized skin infection. 2. Central Nervous System Infection-This can be in the form of Meningitis, which can be deadly. 3. Epidural Infections-This can be in the form of an epidural abscess, which can cause pressure inside of the spine, causing compression of the spinal cord with subsequent paralysis. This would require an emergency surgery to decompress, and there are no guarantees that the patient would recover from the paralysis. 4. Discitis-This is an infection of the intervertebral discs.  It occurs in about 1% of discography procedures.  It is difficult to treat and it may lead to surgery.        2. Pain: the needles have to go through skin and soft tissues, will cause soreness.       3. Damage to internal structures:  The nerves to be lesioned may be near blood vessels or    other nerves which can be potentially damaged.       4. Bleeding: Bleeding is more common if the patient is taking blood thinners such as  aspirin, Coumadin, Ticiid,  Plavix, etc., or if he/she have some genetic predisposition  such as hemophilia. Bleeding into the spinal canal can cause compression of the spinal  cord with subsequent paralysis.  This would require an emergency surgery to  decompress and there are no guarantees that the patient would recover from the  paralysis.       5. Pneumothorax:  Puncturing of a lung is a possibility, every time a needle is introduced in  the area of the chest or upper back.  Pneumothorax refers to free air around the  collapsed lung(s), inside of the thoracic cavity (chest cavity).  Another two possible  complications related to a similar event would include: Hemothorax and Chylothorax.   These are variations of the Pneumothorax, where instead of air around the collapsed  lung(s), you may have blood or chyle, respectively.       6. Spinal headaches: They may occur with any procedures in the area of the spine.       7. Persistent CSF (Cerebro-Spinal Fluid) leakage: This is a rare problem, but may occur  with prolonged intrathecal or epidural catheters either due to the formation of a fistulous  track or a dural tear.       8. Nerve damage: By working so close to the spinal cord, there is always a possibility of  nerve damage, which could be as serious as a permanent spinal cord injury with  paralysis.       9. Death:  Although rare, severe deadly allergic reactions known as "Anaphylactic  reaction" can occur to any of the medications used.      10. Worsening of the symptoms:  We can always make thing worse.  What are the chances of something like this happening? Chances of any of this occuring are extremely low.  By statistics, you have more of a chance of getting killed in a motor vehicle accident:  while driving to the hospital than any of the above occurring .  Nevertheless, you should be aware that they are possibilities.  In general, it is similar to taking a shower.  Everybody knows that you can slip, hit your head and get  killed.  Does that mean that you should not shower again?  Nevertheless always keep in mind that statistics do not mean anything if you happen to be on the wrong side of them.  Even if a procedure has a 1 (one) in a 1,000,000 (million) chance of going wrong, it you happen to be that one..Also, keep in mind that by statistics, you have more of a chance of having something go wrong when taking medications.  Who should not have this procedure? If you are on a blood thinning medication (e.g. Coumadin, Plavix, see list of "Blood Thinners"), or if you have an active infection going on, you should not have the procedure.  If you are taking any blood thinners, please inform your physician.  How should I prepare for this procedure?  Do not eat or drink anything at least six hours prior to the procedure.  Bring a driver with you .  It cannot be a taxi.  Come accompanied by an adult that can drive you back, and that is strong enough to help you if your legs get weak or numb from the local anesthetic.  Take all of your medicines the morning of the procedure with just enough water to swallow them.  If you have diabetes, make sure that you are scheduled to have your procedure done first thing in the morning, whenever possible.  If you have diabetes, take only half of your insulin dose and notify our nurse that you have done so as soon as you arrive at the clinic.  If you are diabetic, but only take blood sugar pills (oral hypoglycemic), then do not take them on the morning of your procedure.  You may take them after you have had the procedure.  Do not take aspirin or any aspirin-containing medications, at least eleven (11) days prior to the procedure.  They may prolong bleeding.  Wear loose fitting clothing that may be easy to take off and that you would not mind if it got stained with Betadine or blood.  Do not wear any jewelry or perfume  Remove any nail coloring.  It will interfere with some of our  monitoring equipment.  NOTE: Remember that this is not meant to be interpreted as a complete list of all possible complications.  Unforeseen problems may occur.  BLOOD THINNERS The following drugs contain aspirin or other products, which can cause increased bleeding during surgery and should not be taken for 2 weeks prior to and 1 week after surgery.  If you should need take something for relief of minor pain, you may take acetaminophen which is found in Tylenol,m Datril, Anacin-3 and Panadol. It is not blood thinner. The products listed below are.  Do not take any of the products listed below in addition to any listed on your instruction sheet.  A.P.C or A.P.C with Codeine Codeine Phosphate Capsules #3 Ibuprofen Ridaura  ABC compound Congesprin Imuran rimadil  Advil Cope Indocin Robaxisal  Alka-Seltzer Effervescent Pain Reliever and Antacid Coricidin or Coricidin-D  Indomethacin Rufen  Alka-Seltzer plus Cold Medicine Cosprin Ketoprofen S-A-C Tablets  Anacin Analgesic Tablets or Capsules Coumadin Korlgesic Salflex  Anacin Extra Strength Analgesic tablets or capsules CP-2 Tablets Lanoril Salicylate  Anaprox Cuprimine Capsules Levenox Salocol  Anexsia-D Dalteparin Magan Salsalate  Anodynos Darvon compound Magnesium Salicylate Sine-off  Ansaid Dasin Capsules Magsal Sodium Salicylate  Anturane Depen Capsules Marnal Soma  APF Arthritis pain formula Dewitt's Pills Measurin Stanback  Argesic Dia-Gesic Meclofenamic Sulfinpyrazone  Arthritis Bayer Timed Release Aspirin Diclofenac Meclomen Sulindac  Arthritis pain formula Anacin Dicumarol Medipren Supac  Analgesic (Safety coated) Arthralgen Diffunasal Mefanamic Suprofen  Arthritis Strength Bufferin Dihydrocodeine Mepro Compound Suprol  Arthropan liquid Dopirydamole Methcarbomol with Aspirin Synalgos  ASA tablets/Enseals Disalcid Micrainin Tagament  Ascriptin Doan's Midol Talwin  Ascriptin A/D Dolene Mobidin Tanderil  Ascriptin Extra Strength  Dolobid Moblgesic Ticlid  Ascriptin with Codeine Doloprin or Doloprin with Codeine Momentum Tolectin  Asperbuf Duoprin Mono-gesic Trendar  Aspergum Duradyne Motrin or Motrin IB Triminicin  Aspirin plain, buffered or enteric coated Durasal Myochrisine Trigesic  Aspirin Suppositories Easprin Nalfon Trillsate  Aspirin with Codeine Ecotrin Regular or Extra Strength Naprosyn Uracel  Atromid-S Efficin Naproxen Ursinus  Auranofin Capsules Elmiron Neocylate Vanquish  Axotal Emagrin Norgesic Verin  Azathioprine Empirin or Empirin with Codeine Normiflo Vitamin E  Azolid Emprazil Nuprin Voltaren  Bayer Aspirin plain, buffered or children's or timed BC Tablets or powders Encaprin Orgaran Warfarin Sodium  Buff-a-Comp Enoxaparin Orudis Zorpin  Buff-a-Comp with Codeine Equegesic Os-Cal-Gesic   Buffaprin Excedrin plain, buffered or Extra Strength Oxalid   Bufferin Arthritis Strength Feldene Oxphenbutazone   Bufferin plain or Extra Strength Feldene Capsules Oxycodone with Aspirin   Bufferin with Codeine Fenoprofen Fenoprofen Pabalate or Pabalate-SF   Buffets II Flogesic Panagesic   Buffinol plain or Extra Strength Florinal or Florinal with Codeine Panwarfarin   Buf-Tabs Flurbiprofen Penicillamine   Butalbital Compound Four-way cold tablets Penicillin   Butazolidin Fragmin Pepto-Bismol   Carbenicillin Geminisyn Percodan   Carna Arthritis Reliever Geopen Persantine   Carprofen Gold's salt Persistin   Chloramphenicol Goody's Phenylbutazone   Chloromycetin Haltrain Piroxlcam   Clmetidine heparin Plaquenil   Cllnoril Hyco-pap Ponstel   Clofibrate Hydroxy chloroquine Propoxyphen         Before stopping any of these medications, be sure to consult the physician who ordered them.  Some, such as Coumadin (Warfarin) are ordered to prevent or treat serious conditions such as "deep thrombosis", "pumonary embolisms", and other heart problems.  The amount of time that you may need off of the medication may also  vary with the medication and the reason for which you were taking it.  If you are taking any of these medications, please make sure you notify your pain physician before you undergo any procedures.

## 2016-06-02 NOTE — Patient Instructions (Signed)
Facet Blocks Patient Information  Description: The facets are joints in the spine between the vertebrae.  Like any joints in the body, facets can become irritated and painful.  Arthritis can also effect the facets.  By injecting steroids and local anesthetic in and around these joints, we can temporarily block the nerve supply to them.  Steroids act directly on irritated nerves and tissues to reduce selling and inflammation which often leads to decreased pain.  Facet blocks may be done anywhere along the spine from the neck to the low back depending upon the location of your pain.   After numbing the skin with local anesthetic (like Novocaine), a small needle is passed onto the facet joints under x-ray guidance.  You may experience a sensation of pressure while this is being done.  The entire block usually lasts about 15-25 minutes.   Conditions which may be treated by facet blocks:   Low back/buttock pain  Neck/shoulder pain  Certain types of headaches  Preparation for the injection:  1. Do not eat any solid food or dairy products within 8 hours of your appointment. 2. You may drink clear liquid up to 3 hours before appointment.  Clear liquids include water, black coffee, juice or soda.  No milk or cream please. 3. You may take your regular medication, including pain medications, with a sip of water before your appointment.  Diabetics should hold regular insulin (if taken separately) and take 1/2 normal NPH dose the morning of the procedure.  Carry some sugar containing items with you to your appointment. 4. A driver must accompany you and be prepared to drive you home after your procedure. 5. Bring all your current medications with you. 6. An IV may be inserted and sedation may be given at the discretion of the physician. 7. A blood pressure cuff, EKG and other monitors will often be applied during the procedure.  Some patients may need to have extra oxygen administered for a short  period. 8. You will be asked to provide medical information, including your allergies and medications, prior to the procedure.  We must know immediately if you are taking blood thinners (like Coumadin/Warfarin) or if you are allergic to IV iodine contrast (dye).  We must know if you could possible be pregnant.  Possible side-effects:   Bleeding from needle site  Infection (rare, may require surgery)  Nerve injury (rare)  Numbness & tingling (temporary)  Difficulty urinating (rare, temporary)  Spinal headache (a headache worse with upright posture)  Light-headedness (temporary)  Pain at injection site (serveral days)  Decreased blood pressure (rare, temporary)  Weakness in arm/leg (temporary)  Pressure sensation in back/neck (temporary)   Call if you experience:   Fever/chills associated with headache or increased back/neck pain  Headache worsened by an upright position  New onset, weakness or numbness of an extremity below the injection site  Hives or difficulty breathing (go to the emergency room)  Inflammation or drainage at the injection site(s)  Severe back/neck pain greater than usual  New symptoms which are concerning to you  Please note:  Although the local anesthetic injected can often make your back or neck feel good for several hours after the injection, the pain will likely return. It takes 3-7 days for steroids to work.  You may not notice any pain relief for at least one week.  If effective, we will often do a series of 2-3 injections spaced 3-6 weeks apart to maximally decrease your pain.  After the initial  series, you may be a candidate for a more permanent nerve block of the facets.  If you have any questions, please call #[phone] [phone]  What are the risk, side effects and possible complications? Generally speaking, most procedures are safe.  However, with any procedure  there are risks, side effects, and the possibility of complications.  The risks and complications are dependent upon the sites that are lesioned, or the type of nerve block to be performed.  The closer the procedure is to the spine, the more serious the risks are.  Great care is taken when placing the radio frequency needles, block needles or lesioning probes, but sometimes complications can occur. 1. Infection: Any time there is an injection through the skin, there is a risk of infection.  This is why sterile conditions are used for these blocks.  There are four possible types of infection. 1. Localized skin infection. 2. Central Nervous System Infection-This can be in the form of Meningitis, which can be deadly. 3. Epidural Infections-This can be in the form of an epidural abscess, which can cause pressure inside of the spine, causing compression of the spinal cord with subsequent paralysis. This would require an emergency surgery to decompress, and there are no guarantees that the patient would recover from the paralysis. 4. Discitis-This is an infection of the intervertebral discs.  It occurs in about 1% of discography procedures.  It is difficult to treat and it may lead to surgery.        2. Pain: the needles have to go through skin and soft tissues, will cause soreness.       3. Damage to internal structures:  The nerves to be lesioned may be near blood vessels or    other nerves which can be potentially damaged.       4. Bleeding: Bleeding is more common if the patient is taking blood thinners such as  aspirin, Coumadin, Ticiid, Plavix, etc., or if he/she have some genetic predisposition  such as hemophilia. Bleeding into the spinal canal can cause compression of the spinal  cord with subsequent paralysis.  This would require an emergency surgery to  decompress and there are no guarantees that the patient would recover from the  paralysis.       5. Pneumothorax:  Puncturing of a lung is a  possibility, every time a needle is introduced in  the area of the chest or upper back.  Pneumothorax refers to free air around the  collapsed lung(s), inside of the thoracic cavity (chest cavity).  Another two possible  complications related to a similar event would include: Hemothorax and Chylothorax.   These are variations of the Pneumothorax, where instead of air around the collapsed  lung(s), you may have blood or chyle, respectively.       6. Spinal headaches: They may occur with any procedures in the area of the spine.       7. Persistent CSF (Cerebro-Spinal Fluid) leakage: This is a rare problem, but may occur  with prolonged intrathecal or epidural catheters either due to the formation of a fistulous  track or a dural tear.       8. Nerve damage: By working so close to the spinal cord, there is always a possibility of  nerve damage, which could be as serious as a permanent spinal cord injury with  paralysis.       9. Death:  Although rare, severe deadly allergic  reactions known as "Anaphylactic  reaction" can occur to any of the medications used.      10. Worsening of the symptoms:  We can always make thing worse.  What are the chances of something like this happening? Chances of any of this occuring are extremely low.  By statistics, you have more of a chance of getting killed in a motor vehicle accident: while driving to the hospital than any of the above occurring .  Nevertheless, you should be aware that they are possibilities.  In general, it is similar to taking a shower.  Everybody knows that you can slip, hit your head and get killed.  Does that mean that you should not shower again?  Nevertheless always keep in mind that statistics do not mean anything if you happen to be on the wrong side of them.  Even if a procedure has a 1 (one) in a 1,000,000 (million) chance of going wrong, it you happen to be that one..Also, keep in mind that by statistics, you have more of a chance of having  something go wrong when taking medications.  Who should not have this procedure? If you are on a blood thinning medication (e.g. Coumadin, Plavix, see list of "Blood Thinners"), or if you have an active infection going on, you should not have the procedure.  If you are taking any blood thinners, please inform your physician.  How should I prepare for this procedure?  Do not eat or drink anything at least six hours prior to the procedure.  Bring a driver with you .  It cannot be a taxi.  Come accompanied by an adult that can drive you back, and that is strong enough to help you if your legs get weak or numb from the local anesthetic.  Take all of your medicines the morning of the procedure with just enough water to swallow them.  If you have diabetes, make sure that you are scheduled to have your procedure done first thing in the morning, whenever possible.  If you have diabetes, take only half of your insulin dose and notify our nurse that you have done so as soon as you arrive at the clinic.  If you are diabetic, but only take blood sugar pills (oral hypoglycemic), then do not take them on the morning of your procedure.  You may take them after you have had the procedure.  Do not take aspirin or any aspirin-containing medications, at least eleven (11) days prior to the procedure.  They may prolong bleeding.  Wear loose fitting clothing that may be easy to take off and that you would not mind if it got stained with Betadine or blood.  Do not wear any jewelry or perfume  Remove any nail coloring.  It will interfere with some of our monitoring equipment.  NOTE: Remember that this is not meant to be interpreted as a complete list of all possible complications.  Unforeseen problems may occur.  BLOOD THINNERS The following drugs contain aspirin or other products, which can cause increased bleeding during surgery and should not be taken for 2 weeks prior to and 1 week after surgery.  If you  should need take something for relief of minor pain, you may take acetaminophen which is found in Tylenol,m Datril, Anacin-3 and Panadol. It is not blood thinner. The products listed below are.  Do not take any of the products listed below in addition to any listed on your instruction sheet.  A.P.C or A.P.C with  Codeine Codeine Phosphate Capsules #3 Ibuprofen Ridaura  ABC compound Congesprin Imuran rimadil  Advil Cope Indocin Robaxisal  Alka-Seltzer Effervescent Pain Reliever and Antacid Coricidin or Coricidin-D  Indomethacin Rufen  Alka-Seltzer plus Cold Medicine Cosprin Ketoprofen S-A-C Tablets  Anacin Analgesic Tablets or Capsules Coumadin Korlgesic Salflex  Anacin Extra Strength Analgesic tablets or capsules CP-2 Tablets Lanoril Salicylate  Anaprox Cuprimine Capsules Levenox Salocol  Anexsia-D Dalteparin Magan Salsalate  Anodynos Darvon compound Magnesium Salicylate Sine-off  Ansaid Dasin Capsules Magsal Sodium Salicylate  Anturane Depen Capsules Marnal Soma  APF Arthritis pain formula Dewitt's Pills Measurin Stanback  Argesic Dia-Gesic Meclofenamic Sulfinpyrazone  Arthritis Bayer Timed Release Aspirin Diclofenac Meclomen Sulindac  Arthritis pain formula Anacin Dicumarol Medipren Supac  Analgesic (Safety coated) Arthralgen Diffunasal Mefanamic Suprofen  Arthritis Strength Bufferin Dihydrocodeine Mepro Compound Suprol  Arthropan liquid Dopirydamole Methcarbomol with Aspirin Synalgos  ASA tablets/Enseals Disalcid Micrainin Tagament  Ascriptin Doan's Midol Talwin  Ascriptin A/D Dolene Mobidin Tanderil  Ascriptin Extra Strength Dolobid Moblgesic Ticlid  Ascriptin with Codeine Doloprin or Doloprin with Codeine Momentum Tolectin  Asperbuf Duoprin Mono-gesic Trendar  Aspergum Duradyne Motrin or Motrin IB Triminicin  Aspirin plain, buffered or enteric coated Durasal Myochrisine Trigesic  Aspirin Suppositories Easprin Nalfon Trillsate  Aspirin with Codeine Ecotrin Regular or Extra Strength  Naprosyn Uracel  Atromid-S Efficin Naproxen Ursinus  Auranofin Capsules Elmiron Neocylate Vanquish  Axotal Emagrin Norgesic Verin  Azathioprine Empirin or Empirin with Codeine Normiflo Vitamin E  Azolid Emprazil Nuprin Voltaren  Bayer Aspirin plain, buffered or children's or timed BC Tablets or powders Encaprin Orgaran Warfarin Sodium  Buff-a-Comp Enoxaparin Orudis Zorpin  Buff-a-Comp with Codeine Equegesic Os-Cal-Gesic   Buffaprin Excedrin plain, buffered or Extra Strength Oxalid   Bufferin Arthritis Strength Feldene Oxphenbutazone   Bufferin plain or Extra Strength Feldene Capsules Oxycodone with Aspirin   Bufferin with Codeine Fenoprofen Fenoprofen Pabalate or Pabalate-SF   Buffets II Flogesic Panagesic   Buffinol plain or Extra Strength Florinal or Florinal with Codeine Panwarfarin   Buf-Tabs Flurbiprofen Penicillamine   Butalbital Compound Four-way cold tablets Penicillin   Butazolidin Fragmin Pepto-Bismol   Carbenicillin Geminisyn Percodan   Carna Arthritis Reliever Geopen Persantine   Carprofen Gold's salt Persistin   Chloramphenicol Goody's Phenylbutazone   Chloromycetin Haltrain Piroxlcam   Clmetidine heparin Plaquenil   Cllnoril Hyco-pap Ponstel   Clofibrate Hydroxy chloroquine Propoxyphen         Before stopping any of these medications, be sure to consult the physician who ordered them.  Some, such as Coumadin (Warfarin) are ordered to prevent or treat serious conditions such as "deep thrombosis", "pumonary embolisms", and other heart problems.  The amount of time that you may need off of the medication may also vary with the medication and the reason for which you were taking it.  If you are taking any of these medications, please make sure you notify your pain physician before you undergo any procedures.

## 2016-06-14 ENCOUNTER — Ambulatory Visit (INDEPENDENT_AMBULATORY_CARE_PROVIDER_SITE_OTHER): Payer: Medicare PPO | Admitting: Family Medicine

## 2016-06-14 ENCOUNTER — Encounter: Payer: Self-pay | Admitting: Family Medicine

## 2016-06-14 VITALS — BP 142/88 | HR 78 | Temp 98.2°F | Resp 18 | Wt 265.8 lb

## 2016-06-14 DIAGNOSIS — J9611 Chronic respiratory failure with hypoxia: Secondary | ICD-10-CM | POA: Diagnosis not present

## 2016-06-14 DIAGNOSIS — G8929 Other chronic pain: Secondary | ICD-10-CM | POA: Diagnosis not present

## 2016-06-14 DIAGNOSIS — I5032 Chronic diastolic (congestive) heart failure: Secondary | ICD-10-CM | POA: Diagnosis not present

## 2016-06-14 DIAGNOSIS — J449 Chronic obstructive pulmonary disease, unspecified: Secondary | ICD-10-CM | POA: Diagnosis not present

## 2016-06-14 DIAGNOSIS — M5136 Other intervertebral disc degeneration, lumbar region: Secondary | ICD-10-CM

## 2016-06-14 NOTE — Progress Notes (Signed)
Patient: Theresa [name] Female    DOB: [birth year]   60 y.o.   MRN: [id number] Visit Date: 06/14/2016  Today's Provider: [name], PA   Chief Complaint  Patient presents with  . COPD  . Follow-up   Subjective:    HPI COPD Follow Up:  Patient is here to recertify for home oxygen.    Past Medical History:  Diagnosis Date  . Anxiety   . Carpal tunnel syndrome   . CHF (congestive heart failure) ([place])   . Chronic generalized abdominal pain   . Chronic rhinitis   . COPD (chronic obstructive pulmonary disease) ([place])   . DDD (degenerative disc disease), cervical   . Depression   . Edema   . Gastritis   . Hematuria   . Hypertension   . Low back pain   . Lumbar radiculitis   . Malodorous urine   . Muscle weakness   . Obesity   . Renal cyst   . Sensory urge incontinence   . Tobacco abuse    Past Surgical History:  Procedure Laterality Date  . ABDOMINAL HYSTERECTOMY    . CARPAL TUNNEL RELEASE Left   . TONSILLECTOMY    . TUBAL LIGATION     Family History  Problem Relation Age of Onset  . Heart disease Mother   . Stroke Mother   . Coronary artery disease Mother   . Lung cancer Sister   . Alcohol abuse Father   . Heart disease Father   . Prostate cancer Neg Hx    Allergies  Allergen Reactions  . Codeine Nausea And Vomiting  . Meloxicam Other (See Comments)    Stomach pain   Previous Medications   ADVAIR DISKUS 250-50 MCG/DOSE AEPB    Inhale 1 puff into the lungs 2 (two) times daily.    ALBUTEROL (PROVENTIL HFA;VENTOLIN HFA) 108 (90 BASE) MCG/ACT INHALER    Inhale 2 puffs into the lungs every 6 (six) hours as needed for wheezing. Reported on 11/03/2015   ALPRAZOLAM (XANAX) 0.5 MG TABLET    Take 1 tablet (0.5 mg total) by mouth at bedtime as needed for anxiety.   ASPIRIN-CAFFEINE (BC FAST PAIN RELIEF ARTHRITIS) 1000-65 MG PACK    Take 1 packet by mouth every 6 (six) hours as needed (Pain).   BISOPROLOL-HYDROCHLOROTHIAZIDE (ZIAC) 10-6.25 MG TABLET    TAKE 1  TABLET BY MOUTH DAILY   CHOLECALCIFEROL ([employer] D3) 2000 UNITS CAPS    Take 1 capsule by mouth daily.   ESTRADIOL (ESTRACE) 1 MG TABLET    TAKE 1 TABLET (1 MG TOTAL) BY MOUTH ONCE DAILY.   FLUTICASONE-SALMETEROL (ADVAIR DISKUS) 250-50 MCG/DOSE AEPB    INHALE 1 PUFF INTO LUNGS TWICE A DAY AS NEEDED   FUROSEMIDE (LASIX) 20 MG TABLET    20 mg daily.    HYDROCODONE-ACETAMINOPHEN (NORCO/VICODIN) 5-325 MG TABLET    Take 1 tablet by mouth every 8 (eight) hours as needed for severe pain.   IPRATROPIUM-ALBUTEROL (DUONEB) 0.5-2.5 (3) MG/3ML SOLN    Take 3 mLs by nebulization every 6 (six) hours as needed (shortness of breath).   MELOXICAM (MOBIC) 15 MG TABLET       METOLAZONE (ZAROXOLYN) 2.5 MG TABLET    Take by mouth.   ORPHENADRINE (NORFLEX) 100 MG TABLET       OXYGEN    Inhale 2.5 L into the lungs continuous. Reported on 11/03/2015   POTASSIUM CHLORIDE SA (K-DUR,KLOR-CON) 20 MEQ TABLET    Take by mouth.   ROFLUMILAST (DALIRESP)  500 MCG TABS TABLET    Take 500 mcg by mouth daily.   SERTRALINE (ZOLOFT) 100 MG TABLET    TAKE 1 TABLET (100 MG TOTAL) BY MOUTH DAILY.   TIOTROPIUM (SPIRIVA) 18 MCG INHALATION CAPSULE    Place 1 capsule (18 mcg total) into inhaler and inhale daily.   VITAMIN D, ERGOCALCIFEROL, (DRISDOL) 50000 UNITS CAPS CAPSULE    Take 1 capsule (50,000 Units total) by mouth 2 (two) times a week. X 6 weeks.    Review of Systems  Constitutional: Negative.   Respiratory: Positive for shortness of breath.   Cardiovascular: Negative.     Social History  Substance Use Topics  . Smoking status: Current Every Day Smoker    Packs/day: 1.00    Years: 40.00    Types: Cigarettes  . Smokeless tobacco: Never Used     Comment: 30+ years, pt states she has cut back to 4-5 cigarettes a day  . Alcohol use No   Objective:   BP (!) 142/88 (BP Location: Left Arm, Patient Position: Sitting, Cuff Size: Large)   Pulse 78   Temp 98.2 F (36.8 C) (Oral)   Resp 18   Wt 265 lb 12.8 oz (120.6 kg)   SpO2  93% Comment: sitting with O2  BMI 47.84 kg/m   Physical Exam  Constitutional: She is oriented to person, place, and time. She appears well-developed and well-nourished.  Obesity.  HENT:  Head: Normocephalic.  Eyes: Conjunctivae and EOM are normal.  Neck: Neck supple.  Cardiovascular: Normal rate and regular rhythm.   Pulmonary/Chest: She is in respiratory distress.  Dyspnea with any exertion and some sitting at rest.  Abdominal: Soft. Bowel sounds are normal.  Neurological: She is alert and oriented to person, place, and time.      Assessment & Plan:     1. Chronic respiratory failure with hypoxia (HCC) Been on oxygen therapy for 5 years due to severe obstructive stage III COPD and chronic diastolic heart failure. Followed by pulmonologist (Dr. Raul Del) regularly. Today Sitting at rest with oxygen at 2.5 LPM pulse oximetry was 93%, sitting without oxygen dropped to 87%, walking with oxygen dropped to 84% and fell further to 80% walking without oxygen. Sitting for 2-5 minutes after walking and using oxygen again, pulse oximetry came back up to 90 - 93%. Must continue constant 2.5 LPM oxygen by nasal cannula. Initially was on prn usage, but on continuous usage the past 2 years. Recheck routine labs and follow up pending reports. - CBC with Differential/Platelet - Comprehensive metabolic panel  2. Chronic obstructive pulmonary disease, unspecified COPD type ([place]) Stage III COPD followed by Dr. Raul Del (pulmonologist).  3. DDD (degenerative disc disease), lumbar On chronic opiate use for persistent back pain. Followed by Dr. Dossie Arbour. - CBC with Differential/Platelet - Comprehensive metabolic panel  4. Chronic pain History of DDD with chronic low back pain followed by Dr. Dossie Arbour. - CBC with Differential/Platelet - Comprehensive metabolic panel  5. Chronic diastolic heart failure (HCC) Followed at heart failure clinic by Darylene Price, [place] and cardiologist Dr. Clayborn Bigness.

## 2016-07-03 IMAGING — CR DG CERVICAL SPINE COMPLETE 4+V
6 of 8 series · 8 of 10 positions shown · non-contrast
Comparison: CT scan of December 31, 2013.

CLINICAL DATA: Acute lower neck pain after fall yesterday. Initial
encounter.

EXAM:
CERVICAL SPINE - COMPLETE 4+ VIEW

[c-spine lat]
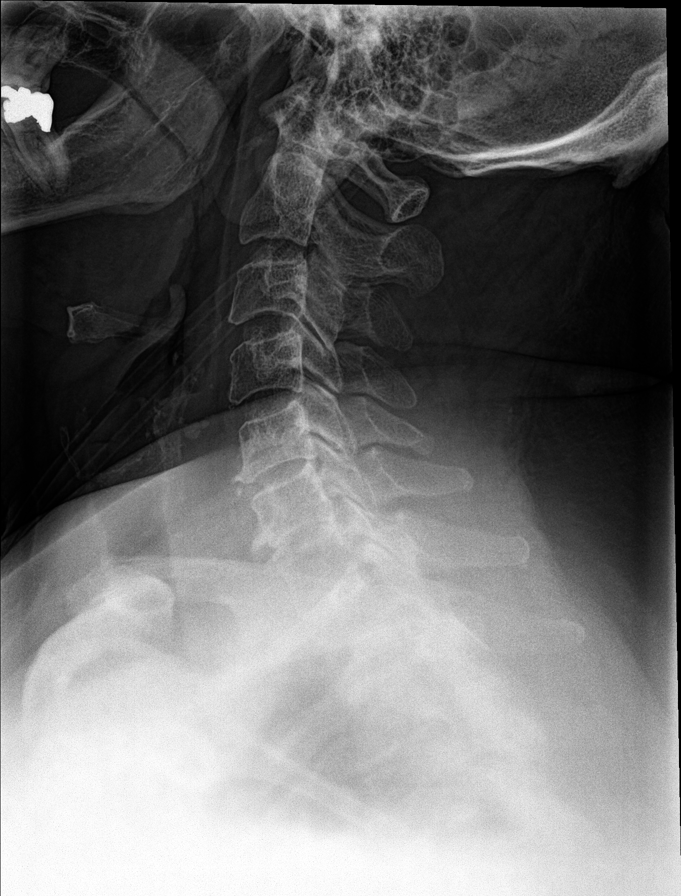

[Series 2: c-spine obl · 0.14mm/px · 2 of 2 slices shown (1 of 2)]
[im 1/2]
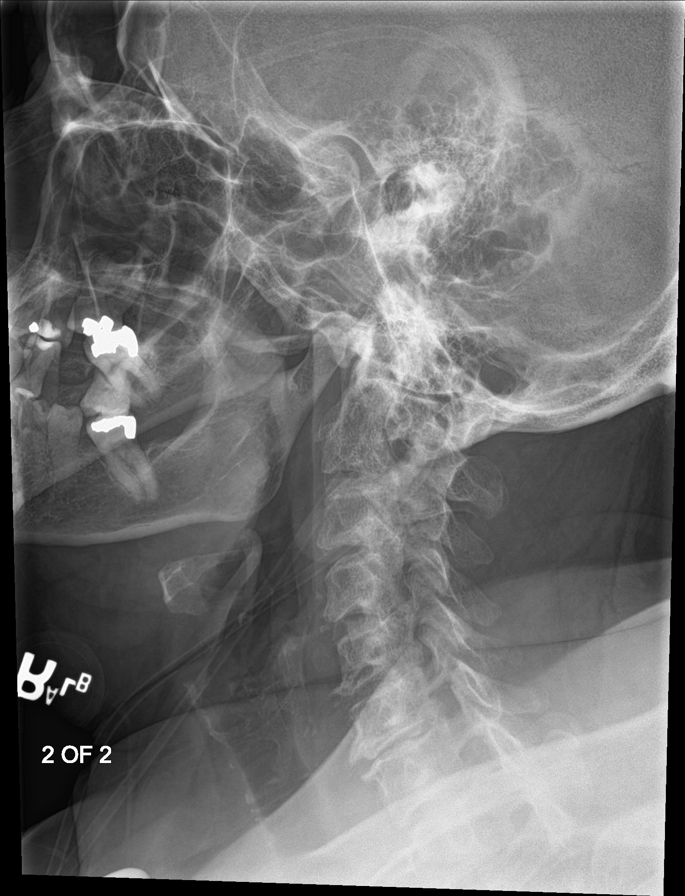
[im 2/2]
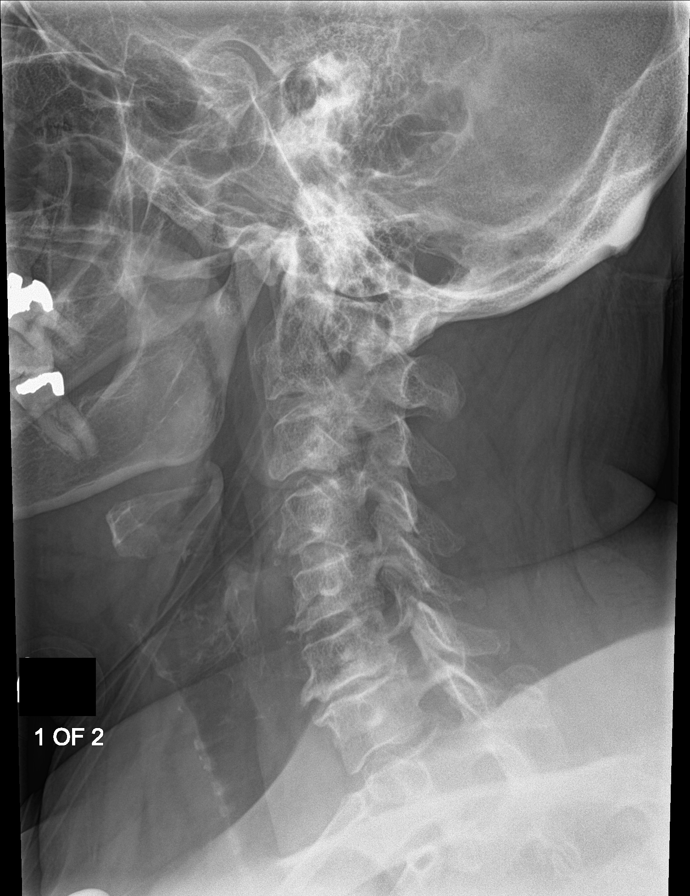

[c-spine obl (2 of 2)]
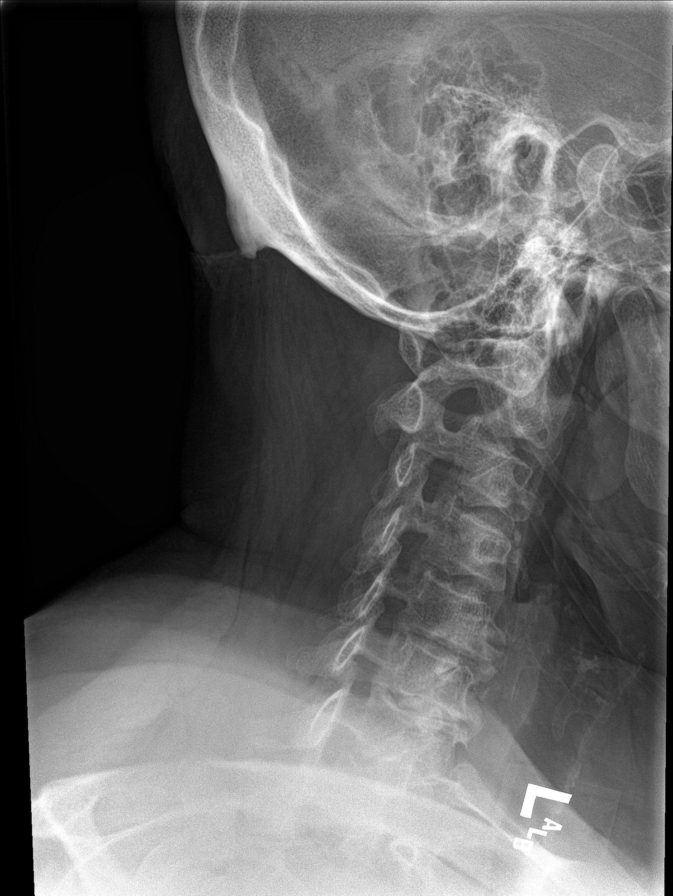

[c-spine ap]
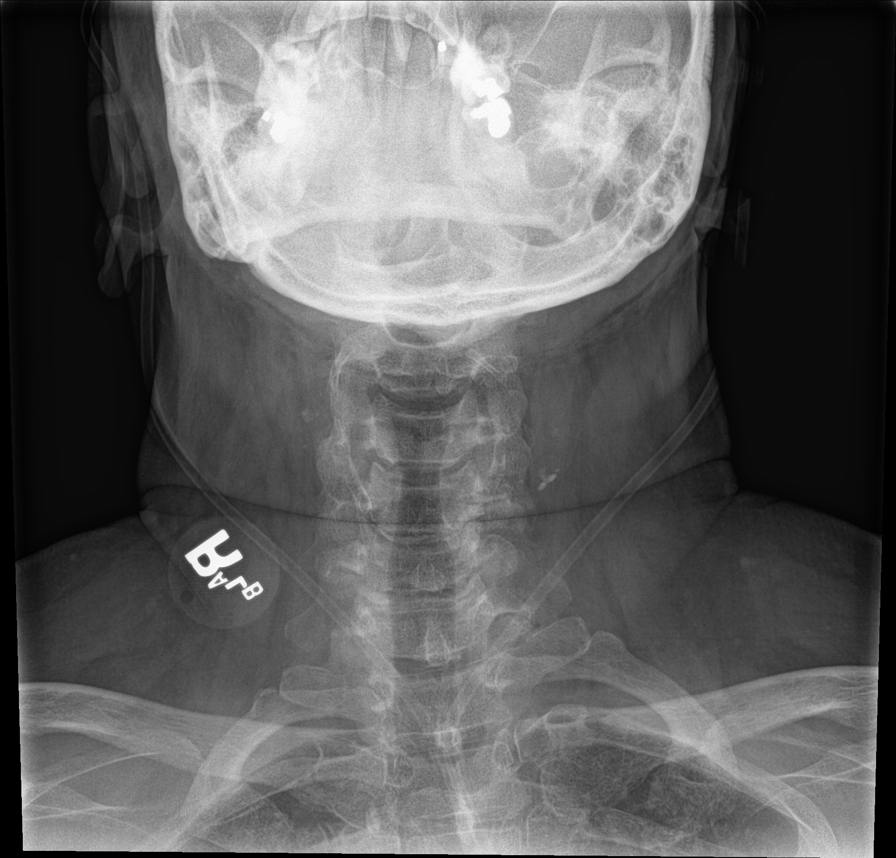

[Series 5: c-spine open mouth · 0.14mm/px · 2 of 2 slices shown (1 of 2)]
[im 1/2]
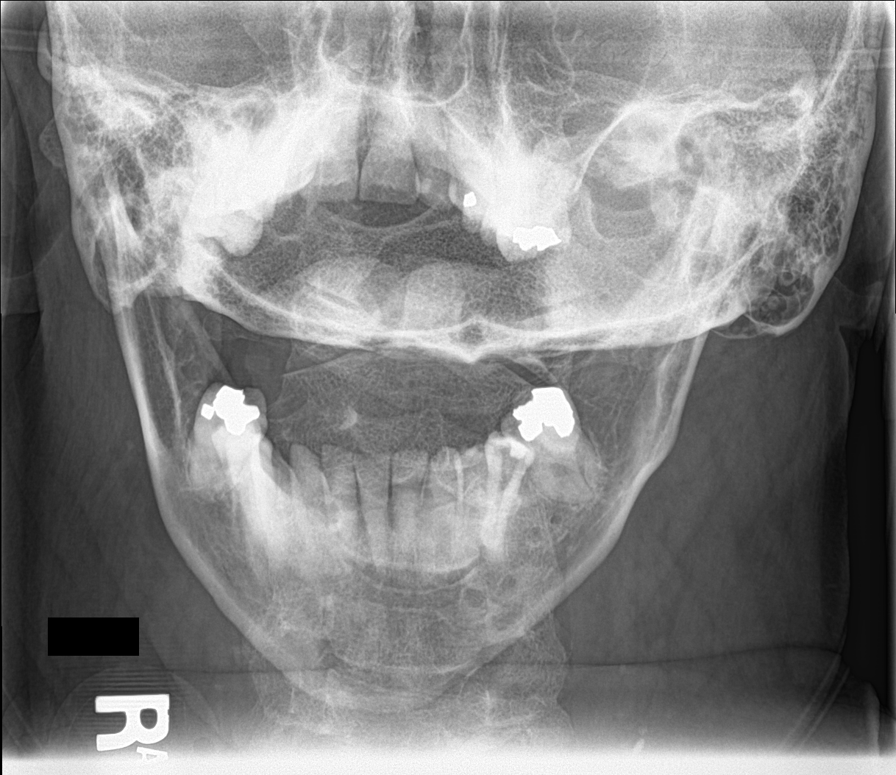
[im 2/2]
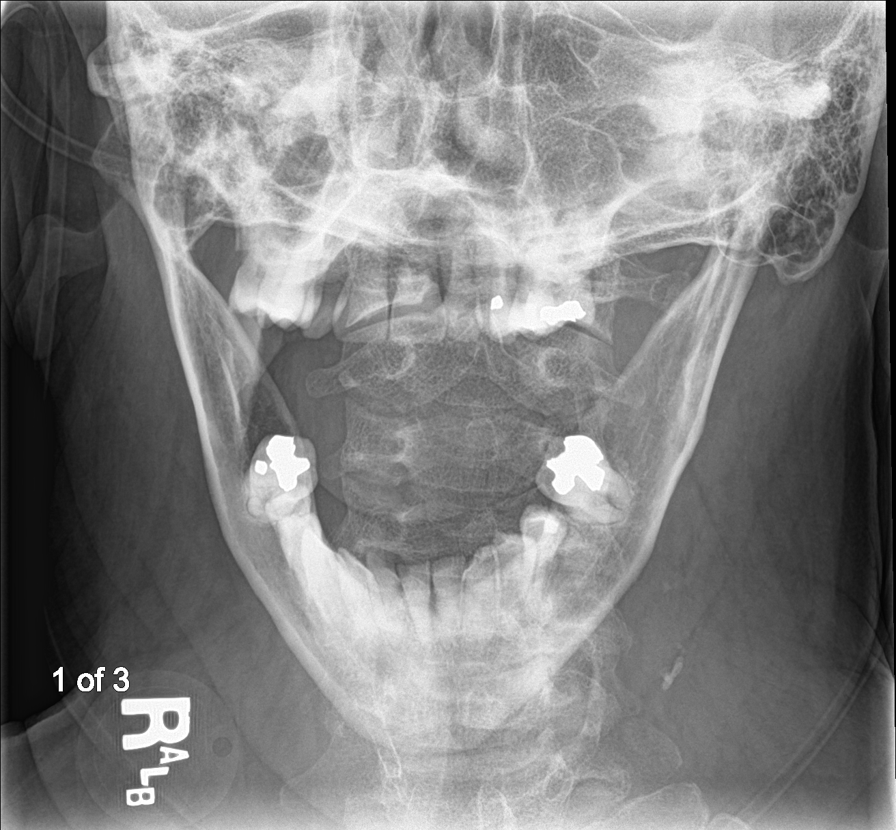

[c-spine open mouth (2 of 2)]
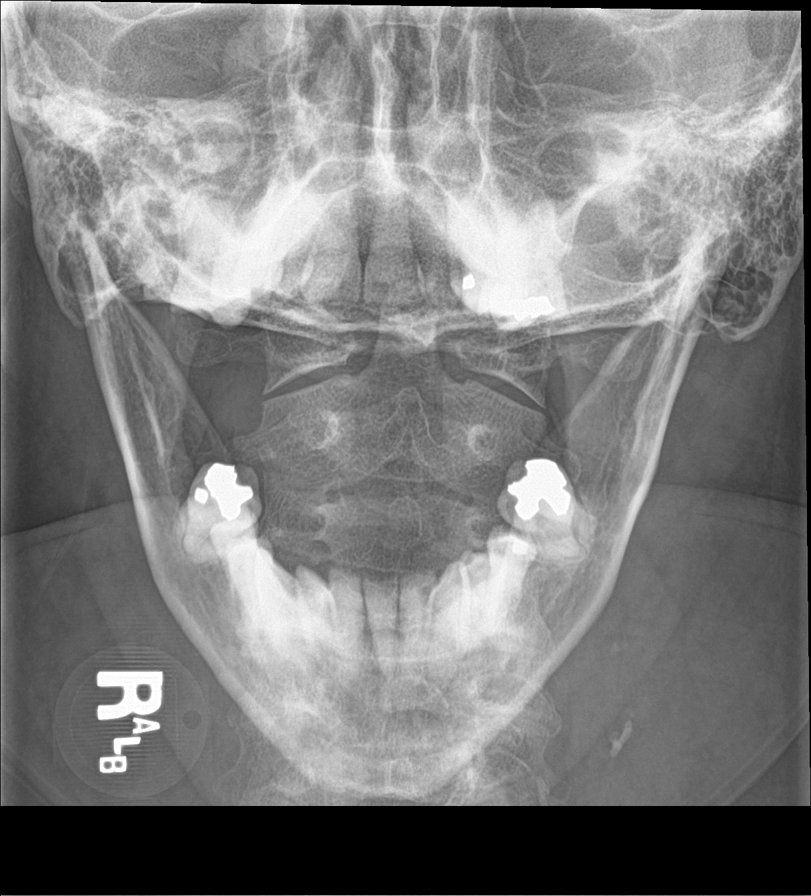

[8 of 10 positions shown; findings below may reference images not displayed]

FINDINGS: No fracture or spondylolisthesis is noted. Mild degenerative disc
disease is noted at C5-6. Moderate degenerative disc disease is
noted at C6-7.
IMPRESSION: Multilevel degenerative disc disease is noted in lower cervical
spine. No acute abnormality seen in the cervical spine.

## 2016-07-03 IMAGING — CR DG SHOULDER 2+V*R*
3 series · 3 of 3 positions shown · non-contrast
Comparison: 12/31/2013

CLINICAL DATA: Fall yesterday, right shoulder

EXAM:
RIGHT SHOULDER - 2+ VIEW

[shoulder grashey]
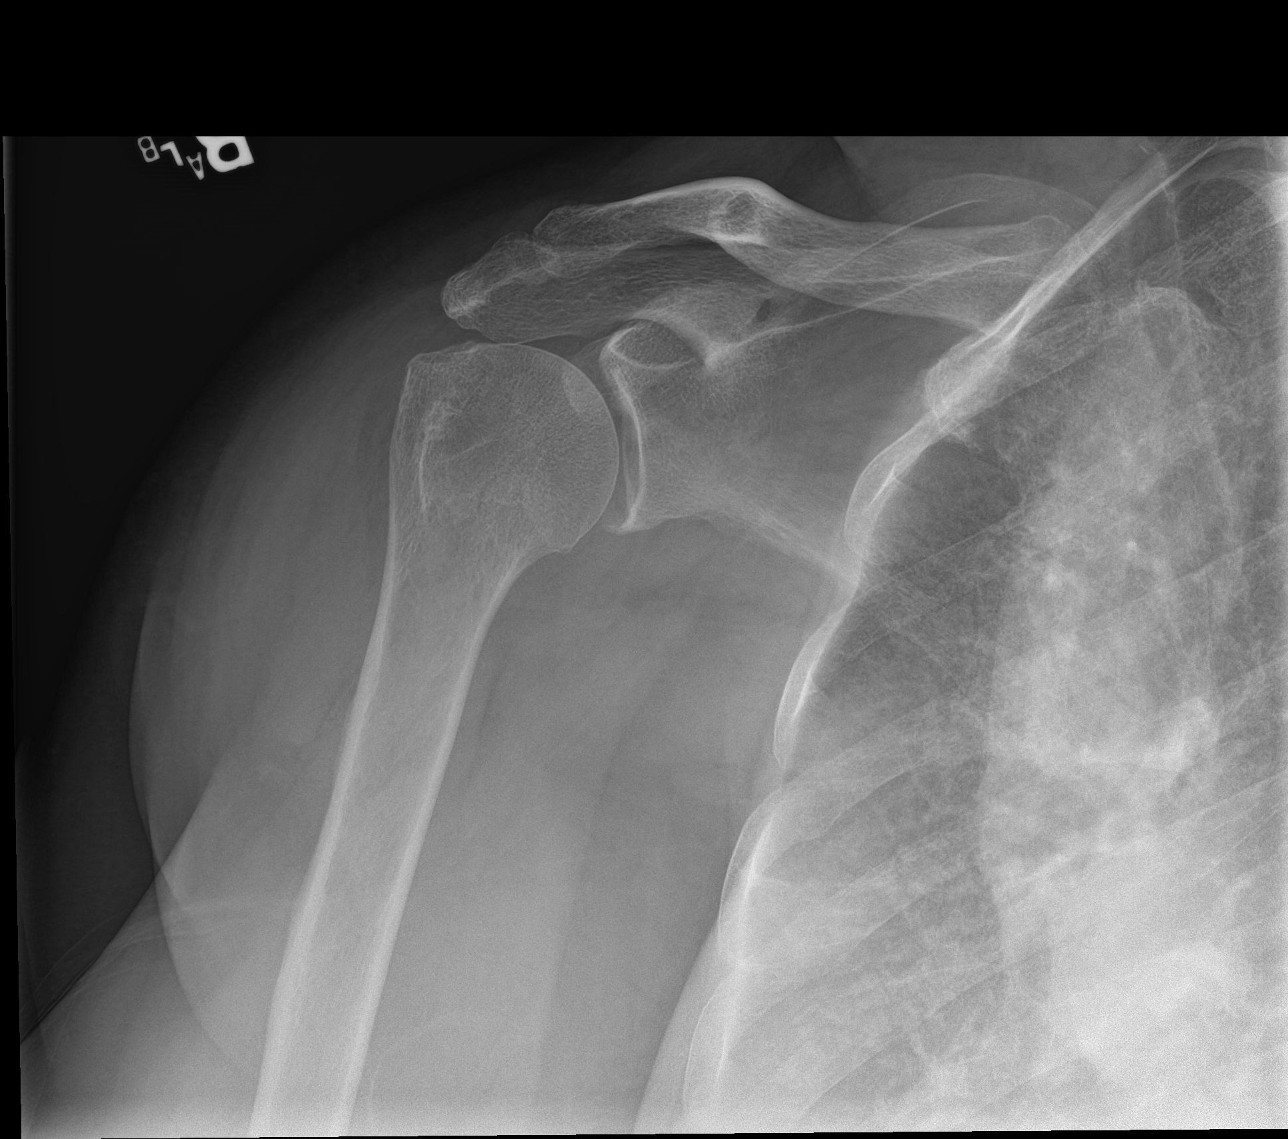

[shoulder y view]
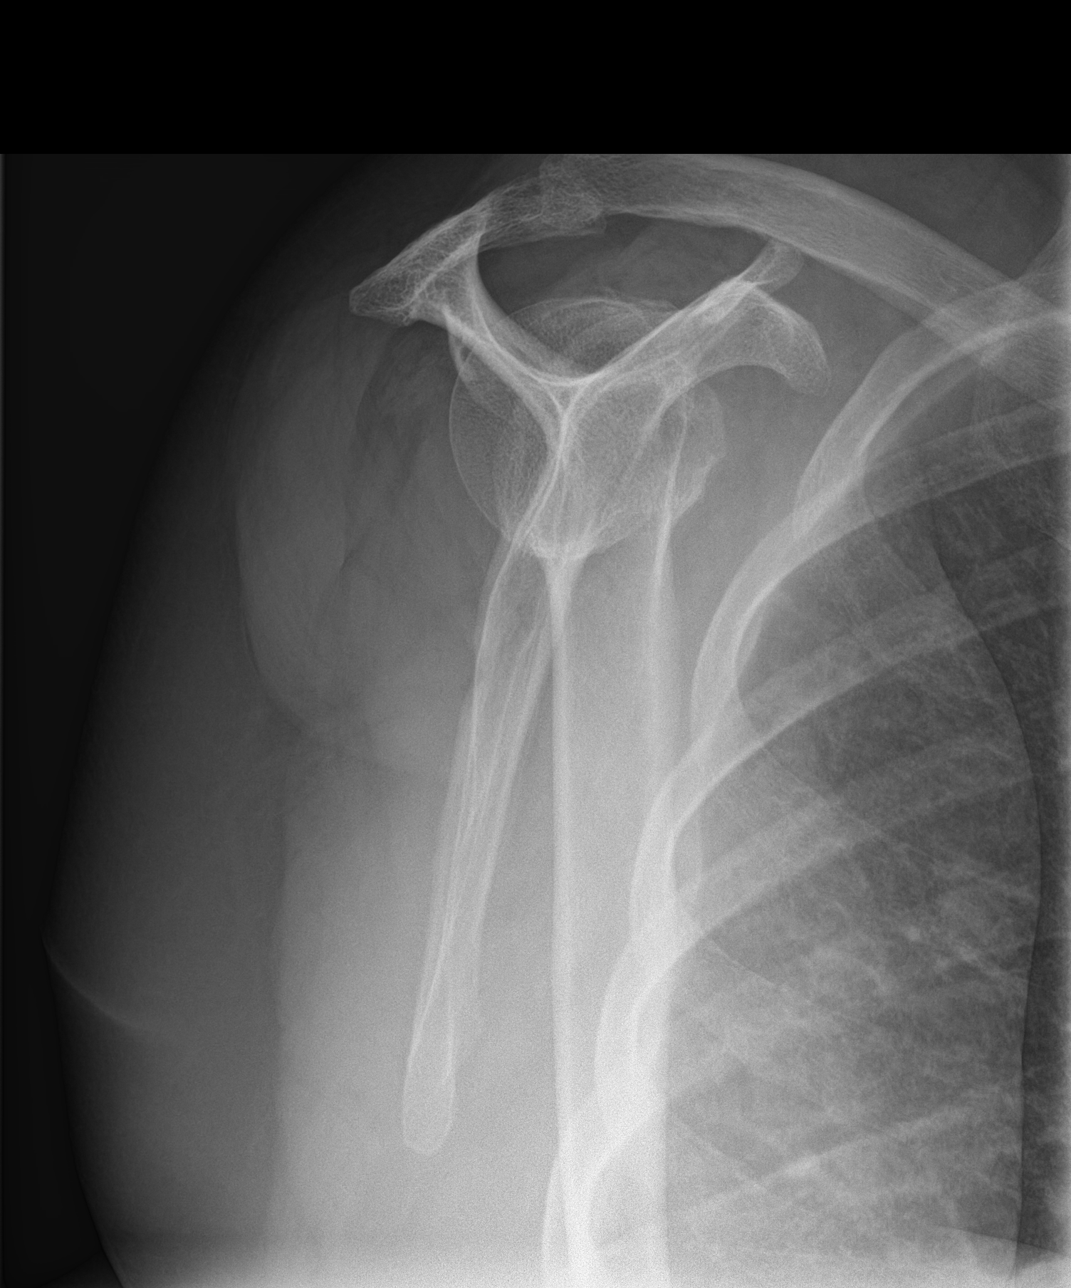

[shoulder axial]
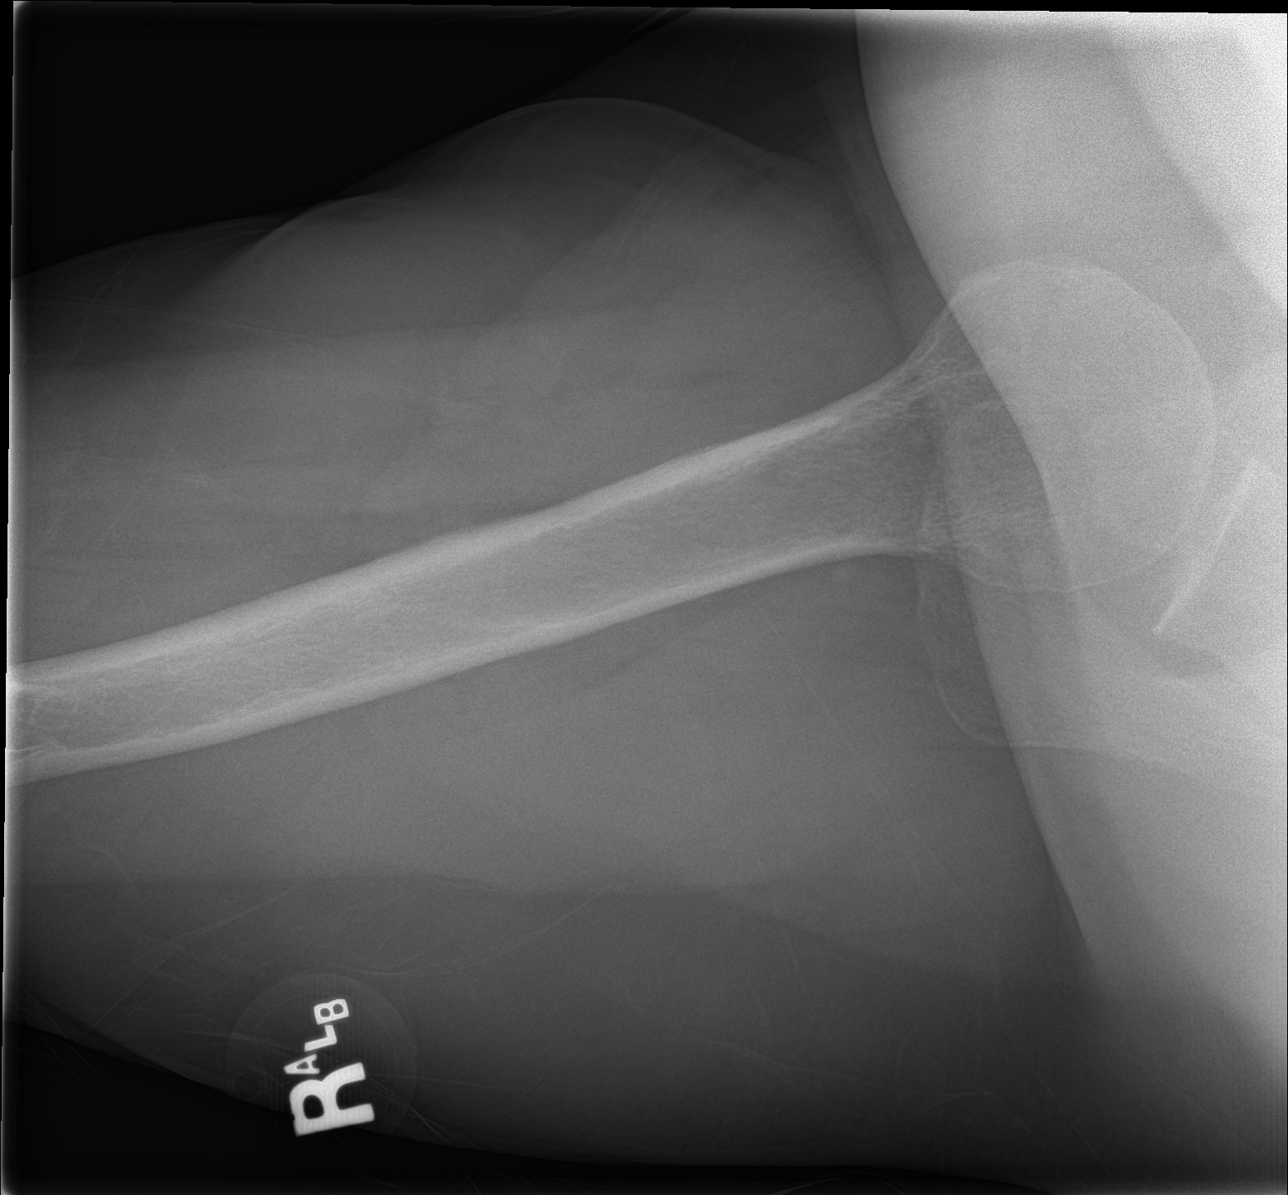

[3 of 3 positions shown; findings below may reference images not displayed]

FINDINGS: Three views of the right shoulder submitted. No acute fracture or
subluxation. Degenerative changes AC joint. Glenohumeral joint is
preserved.
IMPRESSION: No acute fracture or subluxation.  Degenerative changes AC joint.

## 2016-07-12 IMAGING — CR DG HIP (WITH OR WITHOUT PELVIS) 2-3V*L*
1 series · 3 of 3 positions shown · non-contrast
Comparison: None.

CLINICAL DATA: Bilateral hip pain on ambulation for 2 weeks.

EXAM:
DG HIP (WITH OR WITHOUT PELVIS) 2-3V LEFT

[Series 1: dg hip unilat w or w/o pelvis 2-3 views  · non-contrast · 0.14mm/px · 3 of 3 slices shown]
[im 1/3]
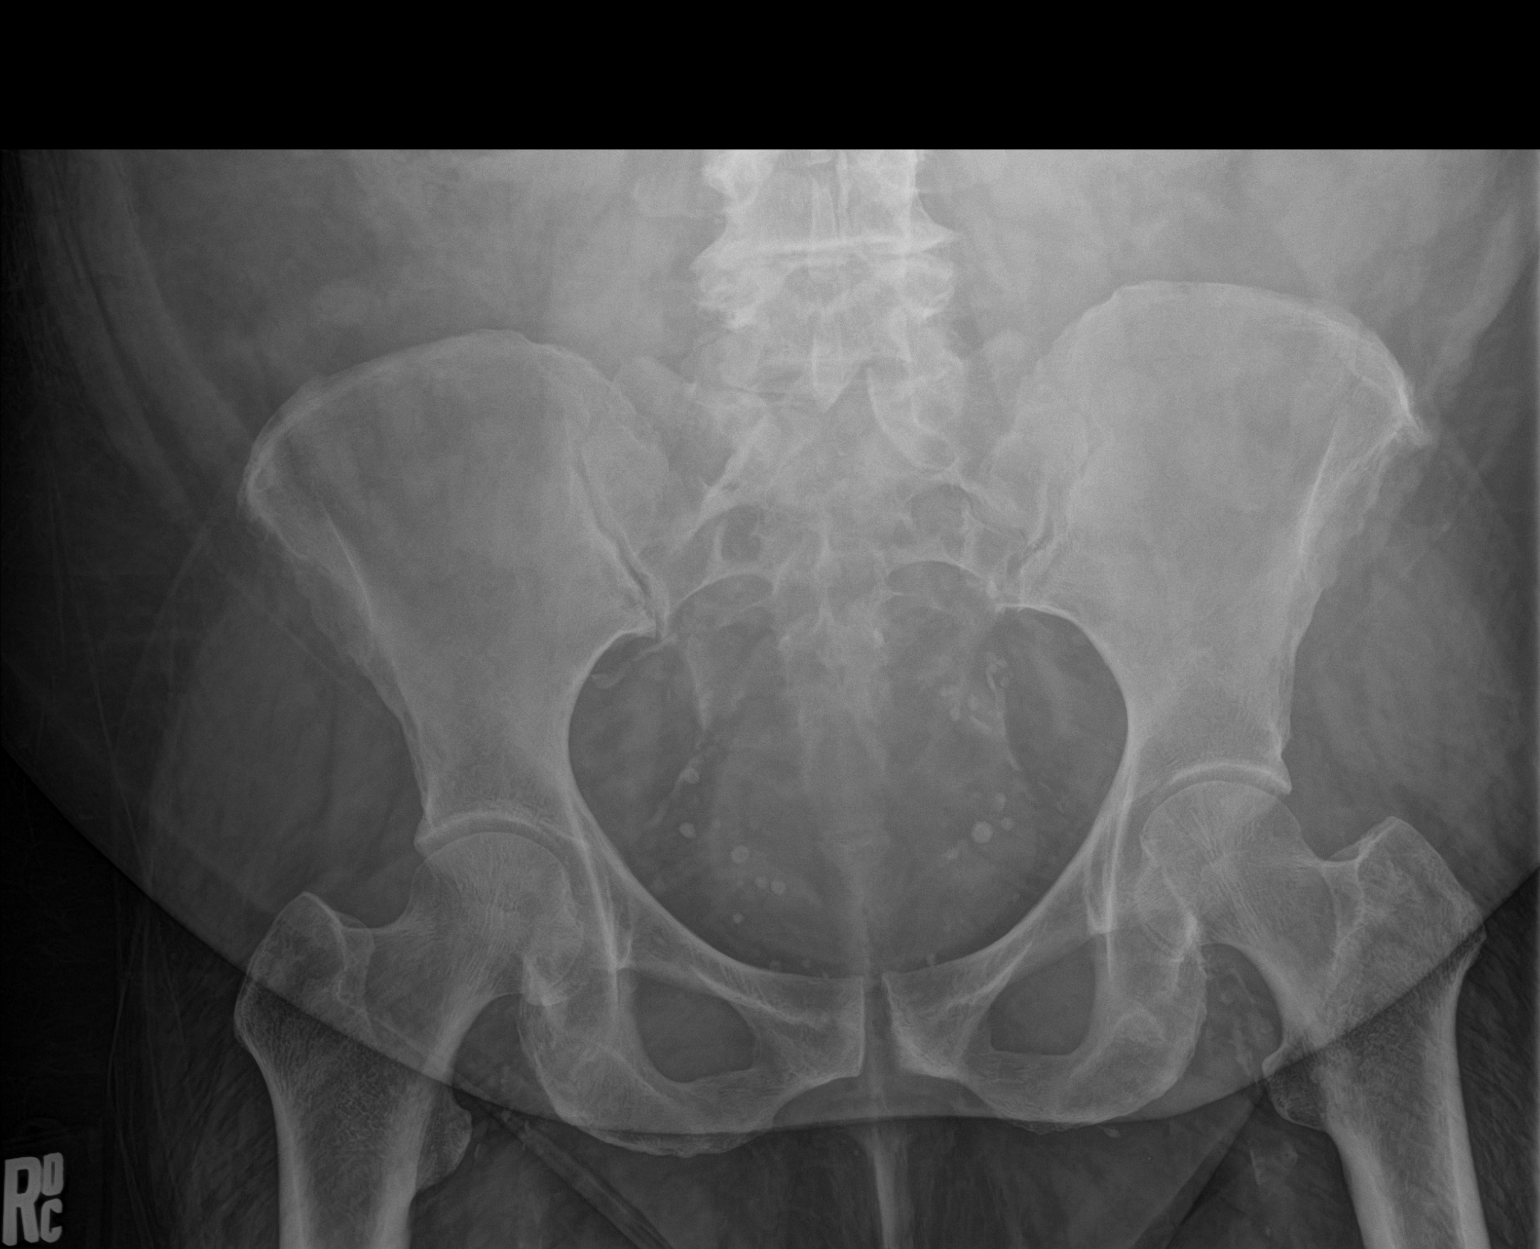
[im 2/3]
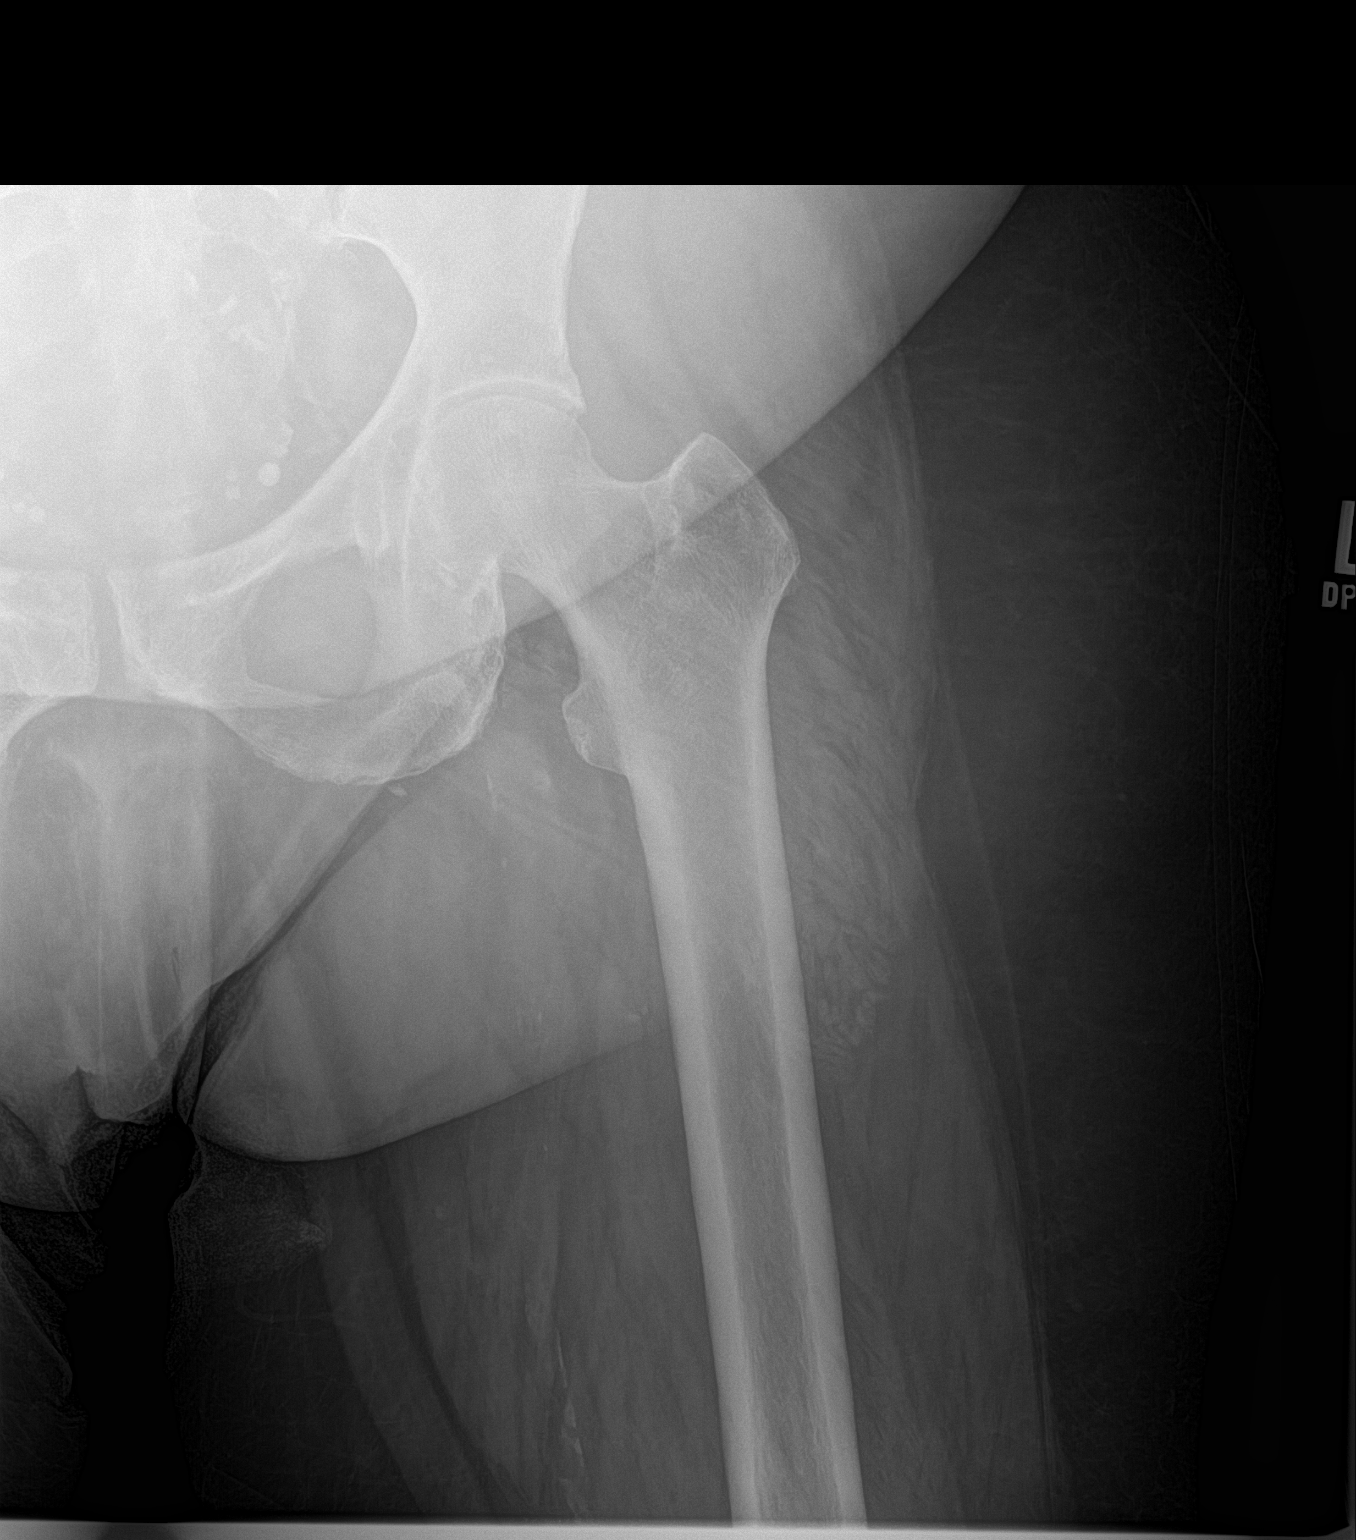
[im 3/3]
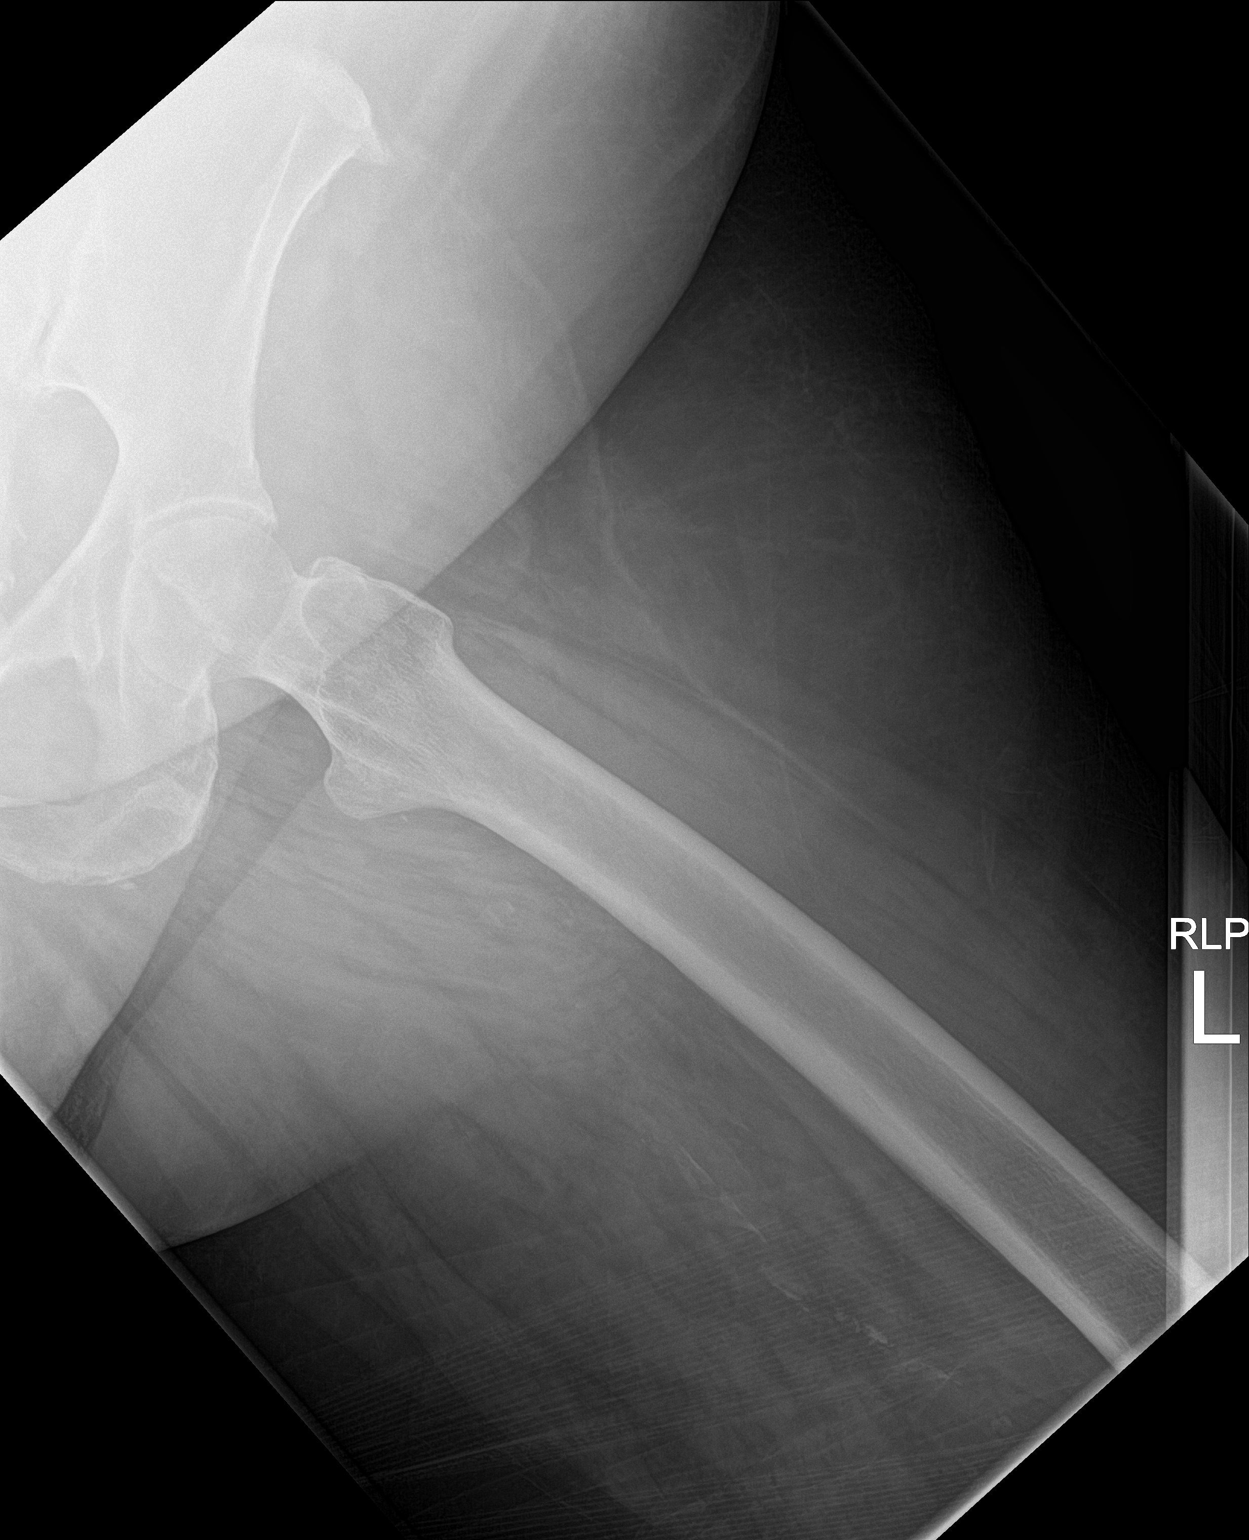

[3 of 3 positions shown; findings below may reference images not displayed]

FINDINGS: There is no evidence of hip fracture or dislocation. There are
degenerative joint changes of the left hip with narrowed joint
space. Degenerative joint changes of the visualized lumbar spine are
noted. There pelvic phleboliths.
IMPRESSION: No acute fracture or dislocation. Degenerative joint changes of the
left hip.

## 2016-07-12 IMAGING — CR DG HIP (WITH OR WITHOUT PELVIS) 2-3V*R*
1 series · 2 of 2 positions shown · non-contrast
Comparison: CT 07/21/2014

CLINICAL DATA: Pt c/o bilateral hip pain with ambulation x2 weeks,
NKI, hx arthritis, no prev surg

EXAM:
DG HIP (WITH OR WITHOUT PELVIS) 2-3V RIGHT

[Series 1: dg hip unilat w or w/o pelvis 2-3 views  · non-contrast · 0.14mm/px · 2 of 2 slices shown]
[im 1/2]
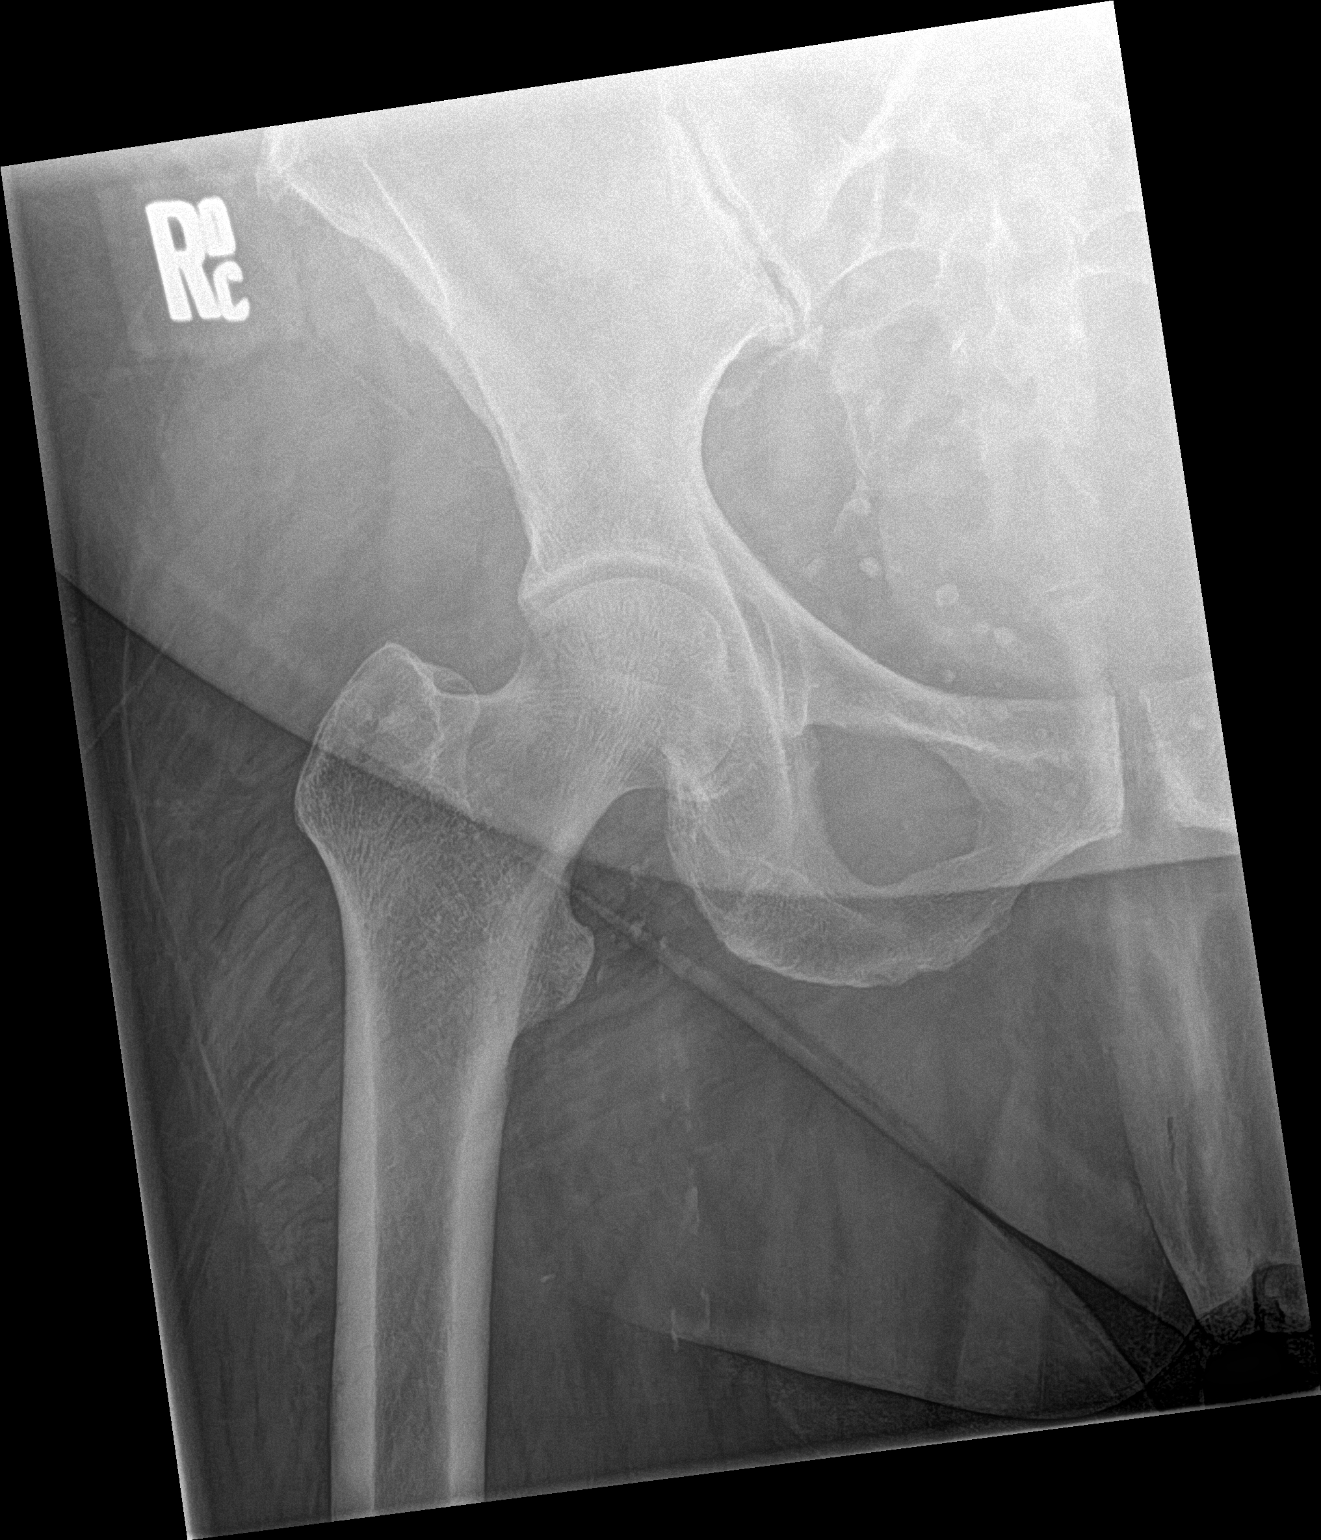
[im 2/2]
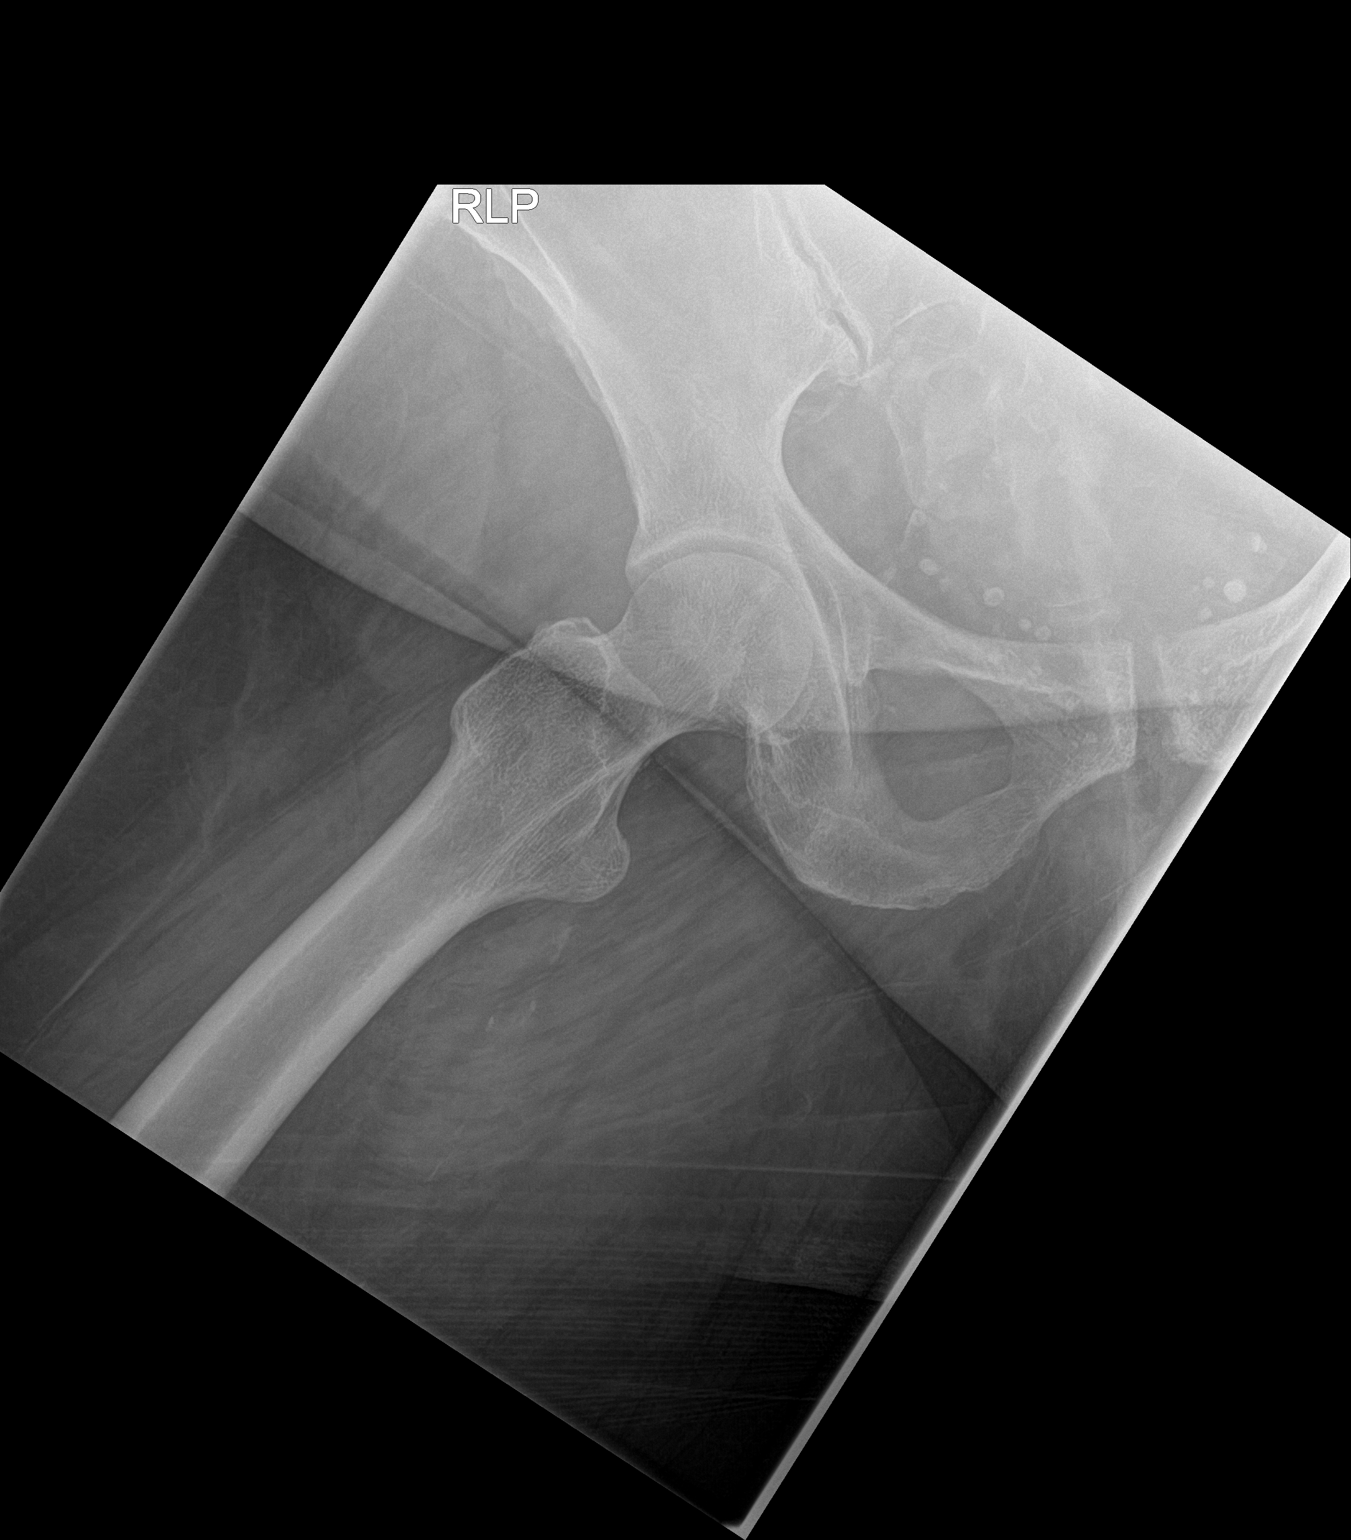

[2 of 2 positions shown; findings below may reference images not displayed]

FINDINGS: There is no evidence of hip fracture or dislocation. There is no
evidence of arthropathy or other focal bone abnormality. Pelvic
phleboliths. Iliofemoral arterial calcifications.
IMPRESSION: Negative hip.

## 2016-08-01 ENCOUNTER — Ambulatory Visit: Payer: Medicare PPO | Attending: Pain Medicine | Admitting: Pain Medicine

## 2016-08-01 ENCOUNTER — Ambulatory Visit: Payer: Medicare PPO | Attending: Family | Admitting: Family

## 2016-08-01 ENCOUNTER — Encounter: Payer: Self-pay | Admitting: Pain Medicine

## 2016-08-01 ENCOUNTER — Encounter: Payer: Self-pay | Admitting: Family

## 2016-08-01 VITALS — BP 129/66 | HR 72 | Temp 98.2°F | Resp 18 | Ht 63.0 in | Wt 267.0 lb

## 2016-08-01 VITALS — BP 115/64 | HR 75 | Resp 20 | Ht 63.0 in | Wt 267.0 lb

## 2016-08-01 DIAGNOSIS — M5030 Other cervical disc degeneration, unspecified cervical region: Secondary | ICD-10-CM | POA: Insufficient documentation

## 2016-08-01 DIAGNOSIS — Z9981 Dependence on supplemental oxygen: Secondary | ICD-10-CM | POA: Insufficient documentation

## 2016-08-01 DIAGNOSIS — I5032 Chronic diastolic (congestive) heart failure: Secondary | ICD-10-CM

## 2016-08-01 DIAGNOSIS — G894 Chronic pain syndrome: Secondary | ICD-10-CM | POA: Diagnosis not present

## 2016-08-01 DIAGNOSIS — M5010 Cervical disc disorder with radiculopathy, unspecified cervical region: Secondary | ICD-10-CM | POA: Insufficient documentation

## 2016-08-01 DIAGNOSIS — I10 Essential (primary) hypertension: Secondary | ICD-10-CM

## 2016-08-01 DIAGNOSIS — Z888 Allergy status to other drugs, medicaments and biological substances status: Secondary | ICD-10-CM | POA: Insufficient documentation

## 2016-08-01 DIAGNOSIS — G8929 Other chronic pain: Secondary | ICD-10-CM

## 2016-08-01 DIAGNOSIS — M5116 Intervertebral disc disorders with radiculopathy, lumbar region: Secondary | ICD-10-CM | POA: Insufficient documentation

## 2016-08-01 DIAGNOSIS — Z8042 Family history of malignant neoplasm of prostate: Secondary | ICD-10-CM | POA: Diagnosis not present

## 2016-08-01 DIAGNOSIS — M545 Low back pain, unspecified: Secondary | ICD-10-CM

## 2016-08-01 DIAGNOSIS — I110 Hypertensive heart disease with heart failure: Secondary | ICD-10-CM | POA: Insufficient documentation

## 2016-08-01 DIAGNOSIS — M5117 Intervertebral disc disorders with radiculopathy, lumbosacral region: Secondary | ICD-10-CM | POA: Insufficient documentation

## 2016-08-01 DIAGNOSIS — F418 Other specified anxiety disorders: Secondary | ICD-10-CM | POA: Insufficient documentation

## 2016-08-01 DIAGNOSIS — Z811 Family history of alcohol abuse and dependence: Secondary | ICD-10-CM | POA: Insufficient documentation

## 2016-08-01 DIAGNOSIS — Z823 Family history of stroke: Secondary | ICD-10-CM | POA: Insufficient documentation

## 2016-08-01 DIAGNOSIS — Z8249 Family history of ischemic heart disease and other diseases of the circulatory system: Secondary | ICD-10-CM | POA: Insufficient documentation

## 2016-08-01 DIAGNOSIS — Z801 Family history of malignant neoplasm of trachea, bronchus and lung: Secondary | ICD-10-CM | POA: Insufficient documentation

## 2016-08-01 DIAGNOSIS — M4722 Other spondylosis with radiculopathy, cervical region: Secondary | ICD-10-CM | POA: Insufficient documentation

## 2016-08-01 DIAGNOSIS — Z79891 Long term (current) use of opiate analgesic: Secondary | ICD-10-CM | POA: Diagnosis not present

## 2016-08-01 DIAGNOSIS — F411 Generalized anxiety disorder: Secondary | ICD-10-CM | POA: Insufficient documentation

## 2016-08-01 DIAGNOSIS — J441 Chronic obstructive pulmonary disease with (acute) exacerbation: Secondary | ICD-10-CM | POA: Diagnosis not present

## 2016-08-01 DIAGNOSIS — E559 Vitamin D deficiency, unspecified: Secondary | ICD-10-CM | POA: Insufficient documentation

## 2016-08-01 DIAGNOSIS — Z885 Allergy status to narcotic agent status: Secondary | ICD-10-CM | POA: Insufficient documentation

## 2016-08-01 DIAGNOSIS — N281 Cyst of kidney, acquired: Secondary | ICD-10-CM | POA: Diagnosis not present

## 2016-08-01 DIAGNOSIS — E669 Obesity, unspecified: Secondary | ICD-10-CM | POA: Insufficient documentation

## 2016-08-01 DIAGNOSIS — G4733 Obstructive sleep apnea (adult) (pediatric): Secondary | ICD-10-CM | POA: Diagnosis not present

## 2016-08-01 DIAGNOSIS — F17210 Nicotine dependence, cigarettes, uncomplicated: Secondary | ICD-10-CM | POA: Insufficient documentation

## 2016-08-01 DIAGNOSIS — R609 Edema, unspecified: Secondary | ICD-10-CM | POA: Insufficient documentation

## 2016-08-01 DIAGNOSIS — M1610 Unilateral primary osteoarthritis, unspecified hip: Secondary | ICD-10-CM | POA: Diagnosis not present

## 2016-08-01 DIAGNOSIS — Z7982 Long term (current) use of aspirin: Secondary | ICD-10-CM | POA: Insufficient documentation

## 2016-08-01 DIAGNOSIS — Z90710 Acquired absence of both cervix and uterus: Secondary | ICD-10-CM | POA: Insufficient documentation

## 2016-08-01 DIAGNOSIS — F1190 Opioid use, unspecified, uncomplicated: Secondary | ICD-10-CM | POA: Diagnosis not present

## 2016-08-01 DIAGNOSIS — G5600 Carpal tunnel syndrome, unspecified upper limb: Secondary | ICD-10-CM | POA: Insufficient documentation

## 2016-08-01 DIAGNOSIS — Z720 Tobacco use: Secondary | ICD-10-CM

## 2016-08-01 DIAGNOSIS — G4730 Sleep apnea, unspecified: Secondary | ICD-10-CM | POA: Insufficient documentation

## 2016-08-01 DIAGNOSIS — J449 Chronic obstructive pulmonary disease, unspecified: Secondary | ICD-10-CM | POA: Insufficient documentation

## 2016-08-01 DIAGNOSIS — Z6841 Body Mass Index (BMI) 40.0 and over, adult: Secondary | ICD-10-CM | POA: Insufficient documentation

## 2016-08-01 DIAGNOSIS — M79605 Pain in left leg: Secondary | ICD-10-CM

## 2016-08-01 MED ORDER — HYDROCODONE-ACETAMINOPHEN 5-325 MG PO TABS: 1.0000 | ORAL_TABLET | Freq: Three times a day (TID) | ORAL | 0 refills | Status: DC | PRN

## 2016-08-01 NOTE — Progress Notes (Signed)
Patient ID: Theresa [name], female    DOB: February 05, 1956, 60 y.o.   MRN: [id number]  HPI  Theresa [name] is a 60 y/o female with a history of chronic low back pain, HTN, depression, COPD on long-term oxygen, anxiety, carpal tunnel syndrome, long-standing tobacco use and chronic heart failure.  Last echo was done 11/03/15 but unable to access results. Echo done on 02/19/15 showed an EF of 50% without valvular stenosis. Recent PFT's done on 01/18/16  Was in the ED on 03/07/16 due to shoulder pain after a mechanical fall. Was treated as whiplash and was given mobic. Last admission was 11/02/15 due to COPD exacerbation.  She presents today for a follow-up visit with fatigue and shortness of breath upon exertion. Symptoms improve upon rest. Does wear her oxygen at 2.5 L around the clock along with bipap at bedtime. She reports a gradual weight gain and attributes it to her increase in using her nebulizer. Also notes an increase in her edema. Is riding along with her husband who is a truck driver but says that she still tries to eat low sodium foods. Continues to smoke.   Past Medical History:  Diagnosis Date  . Anxiety   . Carpal tunnel syndrome   . CHF (congestive heart failure) ([place])   . Chronic generalized abdominal pain   . Chronic rhinitis   . COPD (chronic obstructive pulmonary disease) ([place])   . DDD (degenerative disc disease), cervical   . Depression   . Edema   . Gastritis   . Hematuria   . Hypertension   . Low back pain   . Lumbar radiculitis   . Malodorous urine   . Muscle weakness   . Obesity   . Renal cyst   . Sensory urge incontinence   . Tobacco abuse     Past Surgical History:  Procedure Laterality Date  . ABDOMINAL HYSTERECTOMY    . CARPAL TUNNEL RELEASE Left   . TONSILLECTOMY    . TUBAL LIGATION      Family History  Problem Relation Age of Onset  . Heart disease Mother   . Stroke Mother   . Coronary artery disease Mother   . Lung cancer Sister   . Alcohol abuse Father    . Heart disease Father   . Prostate cancer Neg Hx     Social History  Substance Use Topics  . Smoking status: Current Every Day Smoker    Packs/day: 1.00    Years: 40.00    Types: Cigarettes  . Smokeless tobacco: Never Used     Comment: 30+ years, pt states she has cut back to 4-5 cigarettes a day  . Alcohol use No    Allergies  Allergen Reactions  . Codeine Nausea And Vomiting  . Meloxicam Other (See Comments)    Stomach pain    Prior to Admission medications   Medication Sig Start Date End Date Taking? Authorizing Provider  albuterol (PROVENTIL HFA;VENTOLIN HFA) 108 (90 BASE) MCG/ACT inhaler Inhale 2 puffs into the lungs every 6 (six) hours as needed for wheezing. Reported on 11/03/2015   Yes Historical Provider, MD  Aspirin-Caffeine (BC FAST PAIN RELIEF ARTHRITIS) 1000-65 MG PACK Take 1 packet by mouth every 6 (six) hours as needed (Pain).   Yes Historical Provider, MD  bisoprolol-hydrochlorothiazide [facility]) 10-6.25 MG tablet TAKE 1 TABLET BY MOUTH DAILY 06/02/16  Yes Mar Daring, PA-C  Cholecalciferol ([employer]S D3) 2000 units CAPS Take 1 capsule by mouth daily.   Yes Historical Provider,  MD  Fluticasone-Salmeterol (ADVAIR DISKUS) 250-50 MCG/DOSE AEPB INHALE 1 PUFF INTO LUNGS TWICE A DAY AS NEEDED 05/23/16  Yes Historical Provider, MD  furosemide (LASIX) 20 MG tablet 20 mg daily.  12/14/15  Yes Historical Provider, MD  HYDROcodone-acetaminophen (NORCO/VICODIN) 5-325 MG tablet Take 1 tablet by mouth every 8 (eight) hours as needed for severe pain. 08/03/16 09/02/16 Yes Milinda Pointer, MD  ipratropium-albuterol (DUONEB) 0.5-2.5 (3) MG/3ML SOLN Take 3 mLs by nebulization every 6 (six) hours as needed (shortness of breath). 02/23/16  Yes Dennis E Chrismon, PA  OXYGEN Inhale 2.5 L into the lungs continuous. Reported on 11/03/2015   Yes Historical Provider, MD  roflumilast (DALIRESP) 500 MCG TABS tablet Take 500 mcg by mouth daily.   Yes Historical Provider, MD  sertraline (ZOLOFT) 100  MG tablet TAKE 1 TABLET (100 MG TOTAL) BY MOUTH DAILY. 04/18/16  Yes Dennis E Chrismon, PA  HYDROcodone-acetaminophen (NORCO/VICODIN) 5-325 MG tablet Take 1 tablet by mouth every 8 (eight) hours as needed for severe pain. 09/02/16 10/02/16  Milinda Pointer, MD  HYDROcodone-acetaminophen (NORCO/VICODIN) 5-325 MG tablet Take 1 tablet by mouth every 8 (eight) hours as needed for severe pain. 10/02/16 11/01/16  Milinda Pointer, MD     Review of Systems  Constitutional: Positive for appetite change (increased due to twice daily nebulizer) and fatigue.  HENT: Negative for congestion, postnasal drip and sore throat.   Eyes: Negative.   Respiratory: Positive for cough and shortness of breath. Negative for chest tightness and wheezing.   Cardiovascular: Positive for palpitations (with exertion) and leg swelling. Negative for chest pain.  Gastrointestinal: Negative for abdominal distention and abdominal pain.  Endocrine: Negative.   Genitourinary: Negative.   Musculoskeletal: Positive for back pain and neck stiffness.  Skin: Negative.   Allergic/Immunologic: Negative.   Neurological: Negative for dizziness and light-headedness.  Hematological: Negative for adenopathy. Does not bruise/bleed easily.  Psychiatric/Behavioral: Positive for sleep disturbance (oxygen @ 2.5L & bipap). Negative for dysphoric mood. The patient is nervous/anxious.    Vitals:   08/01/16 0936  BP: 115/64  Pulse: 75  Resp: 20  SpO2: 93%  Weight: 267 lb (121.1 kg)  Height: _0  (1.6 m)      Physical Exam  Constitutional: She is oriented to person, place, and time. She appears well-developed and well-nourished.  HENT:  Head: Normocephalic and atraumatic.  Eyes: Conjunctivae are normal. Pupils are equal, round, and reactive to light.  Neck: Normal range of motion. Neck supple.  Cardiovascular: Normal rate and regular rhythm.   Pulmonary/Chest: Effort normal. She has no wheezes. She has no rales.  Abdominal: Soft.  She exhibits no distension. There is no tenderness.  Musculoskeletal: She exhibits edema (2+ pitting edema in bilateral lower legs). She exhibits no tenderness.  Neurological: She is alert and oriented to person, place, and time.  Skin: Skin is warm and dry.  Psychiatric: She has a normal mood and affect. Her behavior is normal. Thought content normal.  Nursing note and vitals reviewed.  Assessment & Plan:  1: Chronic heart failure with preserved ejection fraction- - NYHA class III - moderately volume overloaded today - weight up 6.4 pounds since last here on 05/05/16 - increase furosemide to twice daily for the next 3-4 days. Continue daily weights even when travelling with her husband - monitor sodium content carefully - check BMET at next visit - sees cardiologist [facility]) later today  2: HTN- - BP looks good today  3: Obstructive sleep apnea- - wearing bipap nightly - oxygen  at 2.5L around the clock - follows closely with pulmonology Raul Del)  4: Tobacco use- - continues to smoke around 1ppd cigarettes - removes herself from the oxygen when smoking - complete cessation discussed for 3 minutes with her  Return in 1 month or sooner for questions/problems before then.

## 2016-08-01 NOTE — Progress Notes (Signed)
Bottle labeled hydrocodone/apap 5/325 #15/90 last filled 07/09/16  Safety precautions to be maintained throughout the outpatient stay will include: orient to surroundings, keep bed in low position, maintain call bell within reach at all times, provide assistance with transfer out of bed and ambulation.

## 2016-08-01 NOTE — Patient Instructions (Addendum)
Continue weighing daily and call for an overnight weight gain of > 2 pounds or a weekly weight gain of >5 pounds.  Increase diuretic to twice daily for next 3-4 days  Elevate legs  Will check lab work at your next visit

## 2016-08-01 NOTE — Progress Notes (Signed)
Patient's Name: Theresa [name]  MRN: [id number]  Referring Provider: Margo Common, [place]  DOB: 10-Jan-1956  PCP: [name], PA  DOS: 08/01/2016  Note by: Kathlen Brunswick. Dossie Arbour, MD  Service setting: Ambulatory outpatient  Specialty: Interventional Pain Management  Location: [facility] (AMB) Pain Management Facility    Patient type: Established   Primary Reason(s) for Visit: Encounter for prescription drug management (Level of risk: moderate) CC: Back Pain (lower )  HPI  Theresa [name] is a 60 y.o. year old, female patient, who comes today for an initial evaluation. She has Neuritis or radiculitis due to rupture of lumbar intervertebral disc; Decreased motor strength; Essential (primary) hypertension; Clinical depression; DDD (degenerative disc disease), lumbar; CAFL (chronic airflow limitation) ([place]); Chronic rhinitis; Carpal tunnel syndrome; Blood clotting disorder ([place]); Anxiety state; Chronic diastolic heart failure ([place]); Chest pain; Tobacco abuse; Chronic pain; Encounter for therapeutic drug level monitoring; Encounter for chronic pain management; Pain management; Neurogenic pain; Neuropathic pain; Musculoskeletal pain; Lumbar spondylosis (L4-5); Chronic low back pain (Location of Primary Source of Pain) (Left); Chronic lower extremity pain (Location of Secondary source of pain) (Left); Chronic lumbar radicular pain (Location of Secondary source of pain) (S1 Dermatome) (Left); Chronic shoulder pain (Right side); Chronic upper extremity pain (Right-sided); Cervical radiculitis (Right side); Abnormal x-ray of lumbar spine; Chronic pain syndrome (significant psychosocial component); 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; Chronic obstructive pulmonary disease ([place]); Coagulation disorder ([place]); Chronic hip pain (Location of Primary Source of Pain) (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); 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); Obesity, Class III, BMI 40-49.9 (morbid obesity) ([place]); Pulmonary hypertensive  arterial disease; Vitamin D deficiency; Long term current use of opiate analgesic; Long term prescription opiate use; Opiate use (15 MME/Day); Lumbar facet syndrome (Location of Primary Source of Pain) (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); Chronic sacroiliac joint pain (Bilateral); Sleep apnea; and Supplemental oxygen dependent on her problem list.. Her primarily concern today is the Back Pain (lower )  Pain Assessment: Self-Reported Pain Score: 5 /10 Clinically the patient looks like a 2/10 Reported level is inconsistent with clinical observations. Information on the proper use of the pain score provided to the patient today. Pain Type: Chronic pain Pain Location: Back Pain Orientation: Lower Pain Descriptors / Indicators: Nagging, Constant, Sharp Pain Frequency: Constant  Theresa [name] was last seen on 06/02/2016 for medication management. During today's appointment we reviewed Theresa [name]'s chronic pain status, as well as her outpatient medication regimen. Assessment details and plan are as follows.  The patient  reports that she does not use drugs. Her body mass index is 47.3 kg/m.  Controlled Substance Pharmacotherapy Assessment REMS (Risk Evaluation and Mitigation Strategy)  Analgesic:Hydrocodone/APAP 5/325 one tablet every 8 hours (15 mg/day of hydrocodone) MME/day:15 mg/day Pill Count: Bottle labeled hydrocodone/apap 5/325 #15/90 last filled 07/09/16. Pharmacokinetics: Onset of action (Liberation/Absorption): Within expected pharmacological parameters Time to Peak effect (Distribution): Timing and results are as within normal expected parameters Duration of action (Metabolism/Excretion): Within normal limits for medication Pharmacodynamics: Analgesic Effect: More than 50% Activity Facilitation: Medication(s) allow patient to sit, stand, walk, and do the basic ADLs Perceived Effectiveness: Described as relatively effective, allowing for increase in activities of daily living (ADL) Side-effects or  Adverse reactions: None reported Monitoring:  PMP: Online review of the past 71-monthperiod conducted. Compliant with practice rules and regulations List of all UDS test(s) done:  Lab Results  Component Value Date   TOXASSSELUR FINAL 03/01/2016   TOXASSSELUR FINAL 01/12/2016   TOXASSSELUR FINAL 09/16/2015   Last UDS on record: ToxAssure Select  13  Date Value Ref Range Status  03/01/2016 FINAL  Final    Comment:    ==================================================================== TOXASSURE SELECT 13 (MW) ==================================================================== Test                             Result       Flag       Units Drug Present and Declared for Prescription Verification   Hydrocodone                    863          EXPECTED   ng/mg creat   Hydromorphone                  195          EXPECTED   ng/mg creat   Dihydrocodeine                 175          EXPECTED   ng/mg creat   Norhydrocodone                 1078         EXPECTED   ng/mg creat    Sources of hydrocodone include scheduled prescription    medications. Hydromorphone, dihydrocodeine and norhydrocodone are    expected metabolites of hydrocodone. Hydromorphone and    dihydrocodeine are also available as scheduled prescription    medications. Drug Absent but Declared for Prescription Verification   Alprazolam                     Not Detected UNEXPECTED ng/mg creat ==================================================================== Test                      Result    Flag   Units      Ref Range   Creatinine              40               mg/dL      >=20 ==================================================================== Declared Medications:  The flagging and interpretation on this report are based on the  following declared medications.  Unexpected results may arise from  inaccuracies in the declared medications.  **Note: The testing scope of this panel includes these medications:  Alprazolam  (Xanax)  Hydrocodone (Norco)  **Note: The testing scope of this panel does not include following  reported medications:  Acetaminophen (Norco)  Albuterol (Duoneb)  Albuterol (Proventil)  Amoxicillin  Aspirin  Bisoprolol (Ziac)  Caffeine  Cholecalciferol  Fluticasone (Advair)  Furosemide (Lasix)  Hydrochlorothiazide (Ziac)  Ipratropium (Duoneb)  Oxygen  Roflumilast (Daliresp)  Salmeterol (Advair)  Sertraline (Zoloft)  Tiotropium (Spiriva) ==================================================================== For clinical consultation, please call [phone]. ====================================================================    UDS interpretation: Compliant          Medication Assessment Form: Reviewed. Patient indicates being compliant with therapy Treatment compliance: Compliant Risk Assessment Profile: Aberrant/High Risk Behavior: None observed or detected today Risk Factors for Fatal Opioid Overdose: None new ones identified today Substance Use Disorder (SUD) Risk Level: Low Opioid Risk Tool (ORT) Total Score: 0 ORT Score Interpretation:  Score <3 = Low Risk for SUD  Score between 4-7 = Moderate Risk for SUD  Score >8 = High Risk for Opioid Abuse  Risk Mitigation Strategies:  Patient Counseling:  Covered Patient-Prescriber Agreement (PPA): Present and active  Notification to other healthcare providers:  Done  Pharmacologic Plan: No change in therapy, at this time  Laboratory Chemistry  Inflammation Markers Lab Results  Component Value Date   ESRSEDRATE 5 09/17/2015   CRP 1.1 (H) 09/17/2015   Renal Function Lab Results  Component Value Date   BUN 23 (H) 12/29/2015   CREATININE 0.62 12/29/2015   GFRAA >60 12/29/2015   GFRNONAA >60 12/29/2015   Hepatic Function Lab Results  Component Value Date   AST 27 12/29/2015   ALT 28 12/29/2015   ALBUMIN 3.9 12/29/2015   Electrolytes Lab Results  Component Value Date   NA 138 12/29/2015   K 4.0 12/29/2015    CL 101 12/29/2015   CALCIUM 9.9 12/29/2015   MG 2.1 09/17/2015   Pain Modulating Vitamins Lab Results  Component Value Date   25OHVITD1 13 (L) 09/17/2015   25OHVITD2 <1.0 09/17/2015   25OHVITD3 13 09/17/2015   Coagulation Parameters Lab Results  Component Value Date   PLT 153 11/02/2015   Cardiovascular Lab Results  Component Value Date   BNP 29.0 11/02/2015   HGB 15.3 11/02/2015   HCT 46.7 11/02/2015   Note: Lab results reviewed.  Recent Diagnostic Imaging  Dg Cervical Spine Complete Result Date: 03/07/2016 CLINICAL DATA:  Acute lower neck pain after fall yesterday. Initial encounter. EXAM: CERVICAL SPINE - COMPLETE 4+ VIEW COMPARISON:  CT scan of December 31, 2013. FINDINGS: No fracture or spondylolisthesis is noted. Mild degenerative disc disease is noted at C5-6. Moderate degenerative disc disease is noted at C6-7. IMPRESSION: Multilevel degenerative disc disease is noted in lower cervical spine. No acute abnormality seen in the cervical spine. Electronically Signed   By: Marijo Conception, M.D.   On: 03/07/2016 15:26   Dg Shoulder Right Result Date: 03/07/2016 CLINICAL DATA:  Fall yesterday, right shoulder EXAM: RIGHT SHOULDER - 2+ VIEW COMPARISON:  12/31/2013 FINDINGS: Three views of the right shoulder submitted. No acute fracture or subluxation. Degenerative changes AC joint. Glenohumeral joint is preserved. IMPRESSION: No acute fracture or subluxation.  Degenerative changes AC joint. Electronically Signed   By: Lahoma Crocker M.D.   On: 03/07/2016 15:16   Cervical Imaging: Cervical MR w/wo contrast:  Results for orders placed in visit on 12/25/07  MR Cervical Spine W Wo Contrast   Narrative * PRIOR REPORT IMPORTED FROM AN EXTERNAL SYSTEM *   PRIOR REPORT IMPORTED FROM THE [facility] WORKFLOW SYSTEM   REASON FOR EXAM:    Abnormal x-ray showing spur impingement on C5-6 on the  left  COMMENTS:   PROCEDURE:     MR  - MR CERVICAL SPINE WO/W  - Dec 25 2007  6:23PM   RESULT:      There is a history of LEFT arm numbness and tingling and  burning  in both arms and hands - worse for the last two weeks.   TECHNIQUE: Multiplanar/multisequence imaging of the cervical spine is  obtained.   Comparison is made to prior plain films of 12/19/2007.   FINDINGS: Multilevel disc degeneration is present. There is a tiny C6-7  disc  protrusion. This is associated with end-plate osteophyte formation.  Minimal  narrowing of the thecal sac is present. The thecal sac measures  approximately 9.0 mm. Similar findings to a lesser degree are present at  C5-6. Bilateral mild neural foraminal narrowing is present at C3-4  secondary  to end-plate osteophyte formation and mild facetal hypertrophy. Bilateral  C5-6 mild to moderate neural foraminal narrowing is present secondary to  degenerative end-plate osteophyte formation. Gadolinium  enhanced images  were  obtained and reveal no focal abnormality. Slight prominence of the central  canal is noted within the cord. No definite syrinx or enhancing cord  abnormality is noted. No focal bony abnormality is noted. The  craniovertebral junction is normal.   IMPRESSION:   1.     Mild diffuse disc degeneration with a small central disc protrusion  at C6-7 with resultant very mild narrowing of the thecal sac at this  level.  The thecal sac is narrowed to approximately 9.0 mm. Similar findings are  noted to a lesser degree at C5-6.  2.     Multifocal mild bilateral neural foraminal narrowing is noted at  the  neural foramen levels listed above.   Thank you for this opportunity to contribute to the care of your patient.       Cervical DG complete:  Results for orders placed during the hospital encounter of 03/07/16  DG Cervical Spine Complete   Narrative CLINICAL DATA:  Acute lower neck pain after fall yesterday. Initial encounter.  EXAM: CERVICAL SPINE - COMPLETE 4+ VIEW  COMPARISON:  CT scan of December 31, 2013.  FINDINGS: No  fracture or spondylolisthesis is noted. Mild degenerative disc disease is noted at C5-6. Moderate degenerative disc disease is noted at C6-7.  IMPRESSION: Multilevel degenerative disc disease is noted in lower cervical spine. No acute abnormality seen in the cervical spine.   Electronically Signed   By: Marijo Conception, M.D.   On: 03/07/2016 15:26    Shoulder Imaging: Shoulder-R MR wo contrast:  Results for orders placed in visit on 04/09/09  MR Shoulder Right Wo Contrast   Narrative * PRIOR REPORT IMPORTED FROM AN EXTERNAL SYSTEM *   PRIOR REPORT IMPORTED FROM THE [facility] WORKFLOW SYSTEM   REASON FOR EXAM:    Rt Shoulder Pain  COMMENTS:  [phone] home or work [phone]   PROCEDURE:     MR  - MR SHOULDER RT  WO CONTRAST  - Apr 09 2009  5:43PM   RESULT:     Multiplanar/multisequence imaging of the RIGHT shoulder was  obtained without the administration of gadolinium.   Evaluation of the osseous structures demonstrates degenerative changes  evident by osteophytosis along the humeral head as well as involving the  acromioclavicular joint.  There is impingement anatomy involving the  acromioclavicular joint. The supraspinatus tendon is diffusely thickened.  There is increased T2 signal along the subacromion and subdeltoid bursal  regions as well as along the articular surface.  Increased T2 signal  projects within the substance of the tendon at the musculotendinous  junction.  There is no evidence of muscle or tendon retraction.  The  biceps  tendon is appreciated within the bicipital groove. The subscapularis is  mildly thickened with fluid appreciated within the tendon sheath. The  infraspinatus and teres minor are intact. Note; along the distal anterior  portion of the supraspinatus tendon an area of horizontally oriented T2  signal appears to traverse the substance of the tendon.   IMPRESSION:   1. Tendinosis involving the supraspinatus tendon with areas of   intrasubstance tearing as well as areas concerning for high grade partial  thickness versus full thickness tearing particularly along the anterior  distal portion of the tendon.   2. Degenerative changes involving the humeral head and acromioclavicular  joint.   3. Findings also concerning for tendinosis involving the subscapularis  tendon.   Thank you for the opportunity to contribute to the  care of your patient.       Shoulder-R DG:  Results for orders placed during the hospital encounter of 03/07/16  DG Shoulder Right   Narrative CLINICAL DATA:  Fall yesterday, right shoulder  EXAM: RIGHT SHOULDER - 2+ VIEW  COMPARISON:  12/31/2013  FINDINGS: Three views of the right shoulder submitted. No acute fracture or subluxation. Degenerative changes AC joint. Glenohumeral joint is preserved.  IMPRESSION: No acute fracture or subluxation.  Degenerative changes AC joint.   Electronically Signed   By: Lahoma Crocker M.D.   On: 03/07/2016 15:16    Thoracic Imaging: Thoracic MR w/wo contrast:  Results for orders placed in visit on 09/30/10  MR Thoracic Spine W Wo Contrast   Narrative * PRIOR REPORT IMPORTED FROM AN EXTERNAL SYSTEM *   PRIOR REPORT IMPORTED FROM THE [facility] WORKFLOW SYSTEM   REASON FOR EXAM:    severe pain with weakness right leg intermittent  COMMENTS:   May transport without cardiac monitor   PROCEDURE:     MR  - MR THORACIC SPINE WO/W  - Sep 30 2010  6:37PM   RESULT:     Pre- and postgadolinium MR imaging of the thoracic spine is  performed emergently. There is patient motion artifact present which  degrades the image quality. The disc spaces and vertebral body heights are  maintained. There is no evidence of marrow edema. No spinal canal or  foraminal stenosis is appreciated. There is no evidence of abnormal  enhancement following intravenous administration of gadolinium. A  paraspinal  mass is not appreciated. No abnormal fluid collection is evident.    IMPRESSION:      No acute abnormality of the thoracic spine.       Lumbosacral Imaging: Lumbar MR wo contrast:  Results for orders placed during the hospital encounter of 02/23/16  MR Lumbar Spine Wo Contrast   Narrative CLINICAL DATA:  Low back pain and pressure with difficulty walking for more than 6 months. No acute injury or prior relevant surgery.  EXAM: MRI LUMBAR SPINE WITHOUT CONTRAST  TECHNIQUE: Multiplanar, multisequence MR imaging of the lumbar spine was performed. No intravenous contrast was administered.  COMPARISON:  Abdominal pelvic CT 07/21/2014.  Lumbar MRI 06/09/2014.  FINDINGS: Segmentation: Conventional anatomy assumed, with the last open disc space designated L5-S1.  Alignment:  Normal.  Vertebrae: No worrisome osseous lesion, acute fracture or pars defect. Scattered hemangiomas are again noted, largest at T12. There are endplate degenerative changes at L4-5. The visualized sacroiliac joints appear unremarkable.  Conus medullaris: Extends to the L1 level and appears normal.  Paraspinal and other soft tissues: No significant paraspinal findings. Bilateral renal cystic lesions are grossly stable.  Disc levels:  No significant disc space findings from T11-12 through L1-2.  L2-3: Minimal disc bulging with preserved disc height. There is a small right foraminal disc protrusion, best seen on the sagittal images. No L2 nerve root encroachment seen.  L3-4: Mild loss of disc height with mild annular disc bulging, facet and ligamentous hypertrophy. No significant spinal stenosis or nerve root encroachment.  L4-5: Chronic degenerative disc disease with loss of disc height, annular disc bulging and endplate osteophytes. There is a new left foraminal disc extrusion, demonstrating low signal on all pulse sequences, probably vacuum phenomena within the extruded disc fragment. This moderately narrows the left foramen and would be expected to cause left L4  nerve root encroachment. Disc bulging and endplate osteophytes contribute to mild underlying foraminal narrowing bilaterally. The lateral recesses are patent.  L5-S1: Disc height and hydration are largely maintained. Mild bilateral facet hypertrophy. No spinal stenosis or nerve root encroachment.  IMPRESSION: 1. Interval development of left foraminal disc extrusion with vacuum phenomenon at L4-5. There is resulting left foraminal narrowing and probable left L4 nerve root encroachment. 2. The findings at the other levels are stable with mild disc bulging and facet hypertrophy. No others significant spinal stenosis or nerve root encroachment.   Electronically Signed   By: Richardean Sale M.D.   On: 02/23/2016 14:08    Lumbar MR wo contrast:  Results for orders placed in visit on 06/09/14  MR L Spine Ltd W/O Cm   Narrative * PRIOR REPORT IMPORTED FROM AN EXTERNAL SYSTEM *   CLINICAL DATA:  Low back and bilateral leg pain with occasional  numbness. Symptoms for 3 years.   EXAM:  MRI LUMBAR SPINE WITHOUT CONTRAST   TECHNIQUE:  Multiplanar, multisequence MR imaging of the lumbar spine was  performed. No intravenous contrast was administered.   COMPARISON:  Plain films lumbar spine 0 is 10/23/2011 and MRI lumbar  spine 09/30/2010.   FINDINGS:  Vertebral body height and alignment are maintained. There is no  worrisome marrow lesion. Scattered hemangiomas are seen, largest in  T12. The conus medullaris is normal in signal and position. Imaged  intra-abdominal contents are unremarkable. The T11-12 and T12-L1  levels are imaged in the sagittal plane only and negative.   L1-2:  Negative.   L2-3:  Negative.   L3-4: Shallow disc bulge without central canal or foraminal  stenosis.   L4-5: Shallow disc bulge endplate spur. The central canal is open.  Neural foramina are mildly narrowed. The appearance is unchanged.   L5-S1: Shallow disc bulge without central canal or  foraminal  narrowing.   IMPRESSION:  No change in the appearance of mild spondylosis most notable at L4-5  where a disc bulge causes mild foraminal narrowing. No nerve root  compression is identified.    Electronically Signed    By: Inge Rise M.D.    On: 06/09/2014 12:21       Lumbar MR w/wo contrast:  Results for orders placed in visit on 09/30/10  MR Lumbar Spine W Wo Contrast   Narrative * PRIOR REPORT IMPORTED FROM AN EXTERNAL SYSTEM *   PRIOR REPORT IMPORTED FROM THE [facility] WORKFLOW SYSTEM   REASON FOR EXAM:    severe pain weakness leg parasthesia intermittent  COMMENTS:   PROCEDURE:     MR  - MR LUMBAR SPINE WO/W  - Sep 30 2010  6:37PM   RESULT:     Pre- and postgadolinium MR imaging of the lumbar spine is  performed emergently. No previous study is available for comparison.   There is diffuse disc bulge at the L3-L4, L4-L5 and L5-S1 levels. There is  mild foraminal narrowing from the diffuse disc bulge bilaterally at the  L5-S1 level slightly more prominent on the left with mild left-sided L4-L5  and foraminal narrowing and moderate right-sided L4-L5 foraminal  narrowing.  Correlate clinically for significance of these findings. Mild foraminal  narrowing is noted on the left at L3-L4. No abnormal enhancement is  demonstrated on the post gadolinium MR images in the T1 axial or sagittal  sequences. The conus medullaris terminates at the L1 vertebral level. No  significant spinal canal stenosis is appreciated although the diffuse  annular disc bulge to cause some flattening of the anterior margins of the  thecal sac most prominently at the L3-L4  and L4-L5 levels.   IMPRESSION:  1. No acute lumbar spine abnormality. Degenerative disc is changes are  noted  at L3-L4, L4-L5 and L5-S1 with some mild spinal canal and mild to moderate  foraminal narrowing as discussed above. No abnormal enhancement evident.       Lumbar DG 2-3 views:  Results for orders placed  in visit on 10/23/11  DG Lumbar Spine 2-3 Views   Narrative * PRIOR REPORT IMPORTED FROM AN EXTERNAL SYSTEM *   PRIOR REPORT IMPORTED FROM THE [facility] [place] FOR EXAM:    FALL, LBP  COMMENTS:   May transport without cardiac monitor   PROCEDURE:     DXR - DXR LUMBAR SPINE AP AND LATERAL  - Oct 23 2011   5:18AM   RESULT:     Lumbar spine images demonstrate a moderately large amount of  retained fecal material especially within the right colon. L4-L5  intervertebral disc space narrowing and prominent hypertrophic  degenerative  endplate spurring is noted. No compression fracture or subluxation is  seen.  Atherosclerotic calcification is noted in the aorta.   IMPRESSION:      L4-L5 DJD. No acute bony abnormality evident.  Atherosclerotic disease present.       Hip Imaging: Hip-R DG 2-3 views:  Results for orders placed during the hospital encounter of 12/29/15  DG HIP UNILAT W OR W/O PELVIS 2-3 VIEWS RIGHT   Narrative CLINICAL DATA:  Pt c/o bilateral hip pain with ambulation x2 weeks, NKI, hx arthritis, no prev surg  EXAM: DG HIP (WITH OR WITHOUT PELVIS) 2-3V RIGHT  COMPARISON:  CT 07/21/2014  FINDINGS: There is no evidence of hip fracture or dislocation. There is no evidence of arthropathy or other focal bone abnormality. Pelvic phleboliths. Iliofemoral arterial calcifications.  IMPRESSION: Negative hip.   Electronically Signed   By: Lucrezia Europe M.D.   On: 12/29/2015 13:10    Hip-L DG 2-3 views:  Results for orders placed during the hospital encounter of 12/29/15  DG HIP UNILAT W OR W/O PELVIS 2-3 VIEWS LEFT   Narrative CLINICAL DATA:  Bilateral hip pain on ambulation for 2 weeks.  EXAM: DG HIP (WITH OR WITHOUT PELVIS) 2-3V LEFT  COMPARISON:  None.  FINDINGS: There is no evidence of hip fracture or dislocation. There are degenerative joint changes of the left hip with narrowed joint space. Degenerative joint changes of the visualized lumbar  spine are noted. There pelvic phleboliths.  IMPRESSION: No acute fracture or dislocation. Degenerative joint changes of the left hip.   Electronically Signed   By: Abelardo Diesel M.D.   On: 12/29/2015 13:10    Knee Imaging: Knee-R DG 4 views:  Results for orders placed in visit on 07/10/15  DG Knee Complete 4 Views Right   Narrative CLINICAL DATA:  Right knee pain and swelling without known injury.  EXAM: RIGHT KNEE - COMPLETE 4+ VIEW  COMPARISON:  June 15, 2010.  FINDINGS: There is no evidence of fracture, dislocation, or joint effusion. There is no evidence of arthropathy or other focal bone abnormality. Soft tissues are unremarkable.  IMPRESSION: Normal right knee.   Electronically Signed   By: Marijo Conception, M.D.   On: 07/10/2015 15:23    Note: Imaging results reviewed.  Meds  The patient has a current medication list which includes the following prescription(s): albuterol, aspirin-caffeine, bisoprolol-hydrochlorothiazide, cholecalciferol, fluticasone-salmeterol, furosemide, hydrocodone-acetaminophen, hydrocodone-acetaminophen, hydrocodone-acetaminophen, ipratropium-albuterol, oxygen-helium, roflumilast, and sertraline.  Current Outpatient Prescriptions on File Prior to  Visit  Medication Sig  . albuterol (PROVENTIL HFA;VENTOLIN HFA) 108 (90 BASE) MCG/ACT inhaler Inhale 2 puffs into the lungs every 6 (six) hours as needed for wheezing. Reported on 11/03/2015  . Aspirin-Caffeine (BC FAST PAIN RELIEF ARTHRITIS) 1000-65 MG PACK Take 1 packet by mouth every 6 (six) hours as needed (Pain).  . bisoprolol-hydrochlorothiazide (ZIAC) 10-6.25 MG tablet TAKE 1 TABLET BY MOUTH DAILY  . Cholecalciferol ([facility] D3) 2000 units CAPS Take 1 capsule by mouth daily.  . Fluticasone-Salmeterol (ADVAIR DISKUS) 250-50 MCG/DOSE AEPB INHALE 1 PUFF INTO LUNGS TWICE A DAY AS NEEDED  . furosemide (LASIX) 20 MG tablet 20 mg daily.   Marland Kitchen ipratropium-albuterol (DUONEB) 0.5-2.5 (3) MG/3ML SOLN  Take 3 mLs by nebulization every 6 (six) hours as needed (shortness of breath).  . OXYGEN Inhale 2.5 L into the lungs continuous. Reported on 11/03/2015  . roflumilast (DALIRESP) 500 MCG TABS tablet Take 500 mcg by mouth daily.  . sertraline (ZOLOFT) 100 MG tablet TAKE 1 TABLET (100 MG TOTAL) BY MOUTH DAILY.   No current facility-administered medications on file prior to visit.    ROS  Constitutional: Denies any fever or chills Gastrointestinal: No reported hemesis, hematochezia, vomiting, or acute GI distress Musculoskeletal: Denies any acute onset joint swelling, redness, loss of ROM, or weakness Neurological: No reported episodes of acute onset apraxia, aphasia, dysarthria, agnosia, amnesia, paralysis, loss of coordination, or loss of consciousness  Allergies  Theresa [name] is allergic to codeine and meloxicam.  [place]  Medical:  Theresa [name]  has a past medical history of Anxiety; Carpal tunnel syndrome; CHF (congestive heart failure) ([place]); Chronic generalized abdominal pain; Chronic rhinitis; COPD (chronic obstructive pulmonary disease) (H[facility]); DDD (degenerative disc disease), cervical; Depression; Edema; Gastritis; Hematuria; Hypertension; Low back pain; Lumbar radiculitis; Malodorous urine; Muscle weakness; Obesity; Renal cyst; Sensory urge incontinence; and Tobacco abuse. Family: family history includes Alcohol abuse in her father; Coronary artery disease in her mother; Heart disease in her father and mother; Lung cancer in her sister; Stroke in her mother. Surgical:  has a past surgical history that includes Tubal ligation; Abdominal hysterectomy; Tonsillectomy; and Carpal tunnel release (Left). Tobacco:  reports that she has been smoking Cigarettes.  She has a 40.00 pack-year smoking history. She has never used smokeless tobacco. Alcohol:  reports that she does not drink alcohol. Drug:  reports that she does not use drugs.  Constitutional Exam  General appearance: Well nourished, well  developed, and well hydrated. In no acute distress Vitals:   08/01/16 1037  BP: 129/66  Pulse: 72  Resp: 18  Temp: 98.2 F (36.8 C)  SpO2: 96%  Weight: 267 lb (121.1 kg)  Height: _0  (1.6 m)  BMI Assessment: Estimated body mass index is 47.3 kg/m as calculated from the following:   Height as of this encounter: _1  (1.6 m).   Weight as of this encounter: 267 lb (121.1 kg).   BMI interpretation: (>40 kg/m2) = Extreme obesity (Class III): This range is associated with a 254% higher incidence of chronic pain. BMI Readings from Last 4 Encounters:  08/01/16 47.30 kg/m  08/01/16 47.30 kg/m  06/14/16 47.84 kg/m  06/02/16 45.36 kg/m   Wt Readings from Last 4 Encounters:  08/01/16 267 lb (121.1 kg)  08/01/16 267 lb (121.1 kg)  06/14/16 265 lb 12.8 oz (120.6 kg)  06/02/16 252 lb (114.3 kg)  Psych/Mental status: Alert and oriented x 3 (person, place, & time) Eyes: PERLA Respiratory: No evidence of acute respiratory distress  Cervical Spine  Exam  Inspection: No masses, redness, or swelling Alignment: Symmetrical Functional ROM: Unrestricted ROM Stability: No instability detected Muscle strength & Tone: Functionally intact Sensory: Unimpaired Palpation: Non-contributory  Upper Extremity (UE) Exam    Side: Right upper extremity  Side: Left upper extremity  Inspection: No masses, redness, swelling, or asymmetry  Inspection: No masses, redness, swelling, or asymmetry  Functional ROM: Unrestricted ROM         Functional ROM: Unrestricted ROM          Muscle strength & Tone: Functionally intact  Muscle strength & Tone: Functionally intact  Sensory: Unimpaired  Sensory: Unimpaired  Palpation: Non-contributory  Palpation: Non-contributory   Thoracic Spine Exam  Inspection: No masses, redness, or swelling Alignment: Symmetrical Functional ROM: Unrestricted ROM Stability: No instability detected Sensory: Unimpaired Muscle strength & Tone: Functionally intact Palpation:  Non-contributory  Lumbar Spine Exam  Inspection: No masses, redness, or swelling Alignment: Symmetrical Functional ROM: Unrestricted ROM Stability: No instability detected Muscle strength & Tone: Functionally intact Sensory: Unimpaired Palpation: Non-contributory Provocative Tests: Lumbar Hyperextension and rotation test: evaluation deferred today       Patrick's Maneuver: evaluation deferred today              Gait & Posture Assessment  Ambulation: Unassisted Gait: Relatively normal for age and body habitus Posture: WNL   Lower Extremity Exam    Side: Right lower extremity  Side: Left lower extremity  Inspection: No masses, redness, swelling, or asymmetry  Inspection: No masses, redness, swelling, or asymmetry  Functional ROM: Unrestricted ROM          Functional ROM: Unrestricted ROM          Muscle strength & Tone: Functionally intact  Muscle strength & Tone: Functionally intact  Sensory: Unimpaired  Sensory: Unimpaired  Palpation: Non-contributory  Palpation: Non-contributory   Assessment  Primary Diagnosis & Pertinent Problem List: The primary encounter diagnosis was Chronic pain syndrome. Diagnoses of Anxiety state, Long term current use of opiate analgesic, Opiate use (15 MME/Day), Chronic obstructive pulmonary disease, unspecified COPD type ([place]), Supplemental oxygen dependent, Chronic low back pain (Location of Primary Source of Pain) (Left), and Chronic pain of left lower extremity were also pertinent to this visit.  Visit Diagnosis: 1. Chronic pain syndrome   2. Anxiety state   3. Long term current use of opiate analgesic   4. Opiate use (15 MME/Day)   5. Chronic obstructive pulmonary disease, unspecified COPD type ([place])   6. Supplemental oxygen dependent   7. Chronic low back pain (Location of Primary Source of Pain) (Left)   8. Chronic pain of left lower extremity    Plan of Care  Pharmacotherapy (Medications Ordered): Meds ordered this encounter  Medications   . HYDROcodone-acetaminophen (NORCO/VICODIN) 5-325 MG tablet    Sig: Take 1 tablet by mouth every 8 (eight) hours as needed for severe pain.    Dispense:  90 tablet    Refill:  0    Do not add this medication to the electronic "Automatic Refill" notification system. Patient may have prescription filled one day early if pharmacy is closed on scheduled refill date. Do not fill until: 08/03/16 To last until: 09/02/16  . HYDROcodone-acetaminophen (NORCO/VICODIN) 5-325 MG tablet    Sig: Take 1 tablet by mouth every 8 (eight) hours as needed for severe pain.    Dispense:  90 tablet    Refill:  0    Do not add this medication to the electronic "Automatic Refill" notification system. Patient may have  prescription filled one day early if pharmacy is closed on scheduled refill date. Do not fill until: 09/02/16 To last until: 10/02/16  . HYDROcodone-acetaminophen (NORCO/VICODIN) 5-325 MG tablet    Sig: Take 1 tablet by mouth every 8 (eight) hours as needed for severe pain.    Dispense:  90 tablet    Refill:  0    Do not add this medication to the electronic "Automatic Refill" notification system. Patient may have prescription filled one day early if pharmacy is closed on scheduled refill date. Do not fill until: 10/02/16 To last until: 11/01/16   New Prescriptions   No medications on file   Medications administered during this visit: Theresa [name] had no medications administered during this visit. Lab-work, Procedure(s), & Referral(s) Ordered: Orders Placed This Encounter  Procedures  . Ambulatory referral to Psychiatry   Imaging & Referral(s) Ordered: AMB REFERRAL TO PSYCHIATRY  Interventional Therapies: Pending/Scheduled/Planned:   Diagnostic bilateral lumbar facet block under fluoro and sedation.   Considering:   Possible lumbar facet radiofrequency ablation.    PRN Procedures:   Diagnostic left sided lumbar facet block under fluoroscopic guidance and IV sedation.   Requested PM  Follow-up: Return in about 3 months (around 11/01/2016) for Med-Mgmt.  Future Appointments Date Time Provider [place]  08/29/2016 11:30 AM [name], FNP [facility] None  10/27/2016 8:45 AM Milinda Pointer, MD [facility] None   Primary Care Physician: [name], PA Location: [facility] Out[facility] Facility Note by: Kathlen Brunswick. Dossie Arbour, M.D, DABA, DABAPM, DABPM, DABIPP, FIPP  Pain Score Disclaimer: We use the NRS-11 scale. This is a self-reported, subjective measurement of pain severity with only modest accuracy. It is used primarily to identify changes within a particular patient. It must be understood that outpatient pain scales are significantly less accurate that those used for research, where they can be applied under ideal controlled circumstances with minimal exposure to variables. In reality, the score is likely to be a combination of pain intensity and pain affect, where pain affect describes the degree of emotional arousal or changes in action readiness caused by the sensory experience of pain. Factors such as social and work situation, setting, emotional state, anxiety levels, expectation, and prior pain experience may influence pain perception and show large inter-individual differences that may also be affected by time variables.  Patient instructions provided during this appointment: There are no Patient Instructions on file for this visit.

## 2016-08-04 ENCOUNTER — Ambulatory Visit: Payer: Medicare PPO | Admitting: Family

## 2016-08-16 ENCOUNTER — Other Ambulatory Visit: Payer: Self-pay | Admitting: Family

## 2016-08-16 MED ORDER — FUROSEMIDE 20 MG PO TABS: 20.0000 mg | ORAL_TABLET | Freq: Every day | ORAL | 5 refills | Status: DC

## 2016-08-29 ENCOUNTER — Ambulatory Visit: Payer: Self-pay | Admitting: Psychiatry

## 2016-08-29 ENCOUNTER — Telehealth: Payer: Self-pay | Admitting: Family Medicine

## 2016-08-29 ENCOUNTER — Encounter: Payer: Self-pay | Admitting: Family

## 2016-08-29 ENCOUNTER — Ambulatory Visit: Payer: Medicare PPO | Attending: Family | Admitting: Family

## 2016-08-29 VITALS — BP 133/83 | HR 79 | Resp 18 | Ht 63.0 in | Wt 263.0 lb

## 2016-08-29 DIAGNOSIS — Z720 Tobacco use: Secondary | ICD-10-CM

## 2016-08-29 DIAGNOSIS — R300 Dysuria: Secondary | ICD-10-CM

## 2016-08-29 DIAGNOSIS — G4733 Obstructive sleep apnea (adult) (pediatric): Secondary | ICD-10-CM

## 2016-08-29 DIAGNOSIS — I5032 Chronic diastolic (congestive) heart failure: Secondary | ICD-10-CM

## 2016-08-29 DIAGNOSIS — I10 Essential (primary) hypertension: Secondary | ICD-10-CM

## 2016-08-29 LAB — URINALYSIS COMPLETE WITH MICROSCOPIC (ARMC ONLY)
Bacteria, UA: NONE SEEN
Bilirubin Urine: NEGATIVE
Glucose, UA: NEGATIVE mg/dL
Ketones, ur: NEGATIVE mg/dL
Leukocytes, UA: NEGATIVE
Nitrite: NEGATIVE
Protein, ur: NEGATIVE mg/dL
Specific Gravity, Urine: 1.009 (ref 1.005–1.030)
pH: 8 (ref 5.0–8.0)

## 2016-08-29 LAB — BASIC METABOLIC PANEL
Anion gap: 8 (ref 5–15)
BUN: 13 mg/dL (ref 6–20)
CO2: 31 mmol/L (ref 22–32)
Calcium: 9.5 mg/dL (ref 8.9–10.3)
Chloride: 102 mmol/L (ref 101–111)
Creatinine, Ser: 0.62 mg/dL (ref 0.44–1.00)
GFR calc Af Amer: >60 mL/min (ref >60)
GFR calc non Af Amer: >60 mL/min (ref >60)
Glucose, Bld: 88 mg/dL (ref 65–99)
Potassium: 3.6 mmol/L (ref 3.5–5.1)
Sodium: 141 mmol/L (ref 135–145)

## 2016-08-29 MED ORDER — SERTRALINE HCL 100 MG PO TABS: ORAL_TABLET | ORAL | 4 refills | Status: DC

## 2016-08-29 MED ORDER — FUROSEMIDE 40 MG PO TABS: 40.0000 mg | ORAL_TABLET | Freq: Every day | ORAL | 1 refills | Status: DC

## 2016-08-29 MED ORDER — FUROSEMIDE 40 MG PO TABS: 40.0000 mg | ORAL_TABLET | Freq: Every day | ORAL | 4 refills | Status: DC

## 2016-08-29 NOTE — Telephone Encounter (Signed)
Refill request. Theresa [name]

## 2016-08-29 NOTE — Patient Instructions (Signed)
Continue weighing daily and call for an overnight weight gain of > 2 pounds or a weekly weight gain of >5 pounds.  Increasing furosemide to 71m daily.

## 2016-08-29 NOTE — Progress Notes (Signed)
Patient ID: Theresa [name], female    DOB: [birth year], 60 y.o.   MRN: [id number]  HPI  Theresa [name] is a 60 y/o female with a history of chronic low back pain, HTN, depression, COPD on long-term oxygen, anxiety, carpal tunnel syndrome, long-standing tobacco use and chronic heart failure.  Last echo was done 11/03/15 but unable to access results. Echo done on 02/19/15 showed an EF of 50% without valvular stenosis. Recent PFT's done on 01/18/16  Was in the ED on 03/07/16 due to shoulder pain after a mechanical fall. Was treated as whiplash and was given mobic. Last admission was 11/02/15 due to COPD exacerbation.  She presents today for a follow-up visit with fatigue and shortness of breath upon exertion. Symptoms improve upon rest. Does wear her oxygen at 2 L around the clock along with bipap at bedtime. She reports a stable weight. Continues to have some edema. Is riding along with her husband who is a truck driver but says that she still tries to eat low sodium foods. Continues to smoke. Has chronic back pain and is followed by the pain clinic. Is having pain upon urination.   Past Medical History:  Diagnosis Date  . Anxiety   . Carpal tunnel syndrome   . CHF (congestive heart failure) ([place])   . Chronic generalized abdominal pain   . Chronic rhinitis   . COPD (chronic obstructive pulmonary disease) ([place])   . DDD (degenerative disc disease), cervical   . Depression   . Edema   . Gastritis   . Hematuria   . Hypertension   . Low back pain   . Lumbar radiculitis   . Malodorous urine   . Muscle weakness   . Obesity   . Renal cyst   . Sensory urge incontinence   . Tobacco abuse     Past Surgical History:  Procedure Laterality Date  . ABDOMINAL HYSTERECTOMY    . CARPAL TUNNEL RELEASE Left   . TONSILLECTOMY    . TUBAL LIGATION      Family History  Problem Relation Age of Onset  . Heart disease Mother   . Stroke Mother   . Coronary artery disease Mother   . Lung cancer Sister   .  Alcohol abuse Father   . Heart disease Father   . Prostate cancer Neg Hx     Social History  Substance Use Topics  . Smoking status: Current Every Day Smoker    Packs/day: 1.00    Years: 40.00    Types: Cigarettes  . Smokeless tobacco: Never Used     Comment: 30+ years, pt states she has cut back to 4-5 cigarettes a day  . Alcohol use No    Allergies  Allergen Reactions  . Codeine Nausea And Vomiting  . Meloxicam Other (See Comments)    Stomach pain    Prior to Admission medications   Medication Sig Start Date End Date Taking? Authorizing Provider  albuterol (PROVENTIL HFA;VENTOLIN HFA) 108 (90 BASE) MCG/ACT inhaler Inhale 2 puffs into the lungs every 6 (six) hours as needed for wheezing. Reported on 11/03/2015   Yes Historical Provider, MD  ALPRAZolam Duanne Moron) 0.5 MG tablet Take 0.5 mg by mouth 2 (two) times daily as needed for anxiety.   Yes Historical Provider, MD  Aspirin-Caffeine (BC FAST PAIN RELIEF ARTHRITIS) 1000-65 MG PACK Take 1 packet by mouth every 6 (six) hours as needed (Pain).   Yes Historical Provider, MD  bisoprolol-hydrochlorothiazide (ZIAC) 10-6.25 MG tablet TAKE 1 TABLET  BY MOUTH DAILY 06/02/16  Yes Mar Daring, PA-C  Cholecalciferol ([facility] D3) 2000 units CAPS Take 1 capsule by mouth daily.   Yes Historical Provider, MD  Fluticasone-Salmeterol (ADVAIR DISKUS) 250-50 MCG/DOSE AEPB INHALE 1 PUFF INTO LUNGS TWICE A DAY AS NEEDED 05/23/16  Yes Historical Provider, MD  furosemide (LASIX) 40 MG tablet Take 0.5 tablet (20 mg total) by mouth daily. 08/29/16  Yes [name], FNP  HYDROcodone-acetaminophen (NORCO/VICODIN) 5-325 MG tablet Take 1 tablet by mouth every 8 (eight) hours as needed for severe pain. 08/03/16 09/02/16 Yes Milinda Pointer, MD  HYDROcodone-acetaminophen (NORCO/VICODIN) 5-325 MG tablet Take 1 tablet by mouth every 8 (eight) hours as needed for severe pain. 09/02/16 10/02/16 Yes Milinda Pointer, MD  HYDROcodone-acetaminophen (NORCO/VICODIN)  5-325 MG tablet Take 1 tablet by mouth every 8 (eight) hours as needed for severe pain. 10/02/16 11/01/16 Yes Milinda Pointer, MD  ipratropium-albuterol (DUONEB) 0.5-2.5 (3) MG/3ML SOLN Take 3 mLs by nebulization every 6 (six) hours as needed (shortness of breath). 02/23/16  Yes Dennis E Chrismon, PA  OXYGEN Inhale 2.5 L into the lungs continuous. Reported on 11/03/2015   Yes Historical Provider, MD  roflumilast (DALIRESP) 500 MCG TABS tablet Take 500 mcg by mouth daily.   Yes Historical Provider, MD  furosemide (LASIX) 40 MG tablet Take 1 tablet (40 mg total) by mouth daily. 08/29/16 11/27/16  [name], FNP  sertraline (ZOLOFT) 100 MG tablet TAKE 1 TABLET (100 MG TOTAL) BY MOUTH DAILY. 08/29/16   Birdie Sons, MD     Review of Systems  Constitutional: Positive for fatigue. Negative for appetite change.  HENT: Negative for congestion, rhinorrhea and sore throat.   Eyes: Negative.   Respiratory: Positive for cough, shortness of breath and wheezing. Negative for chest tightness.   Cardiovascular: Positive for leg swelling. Negative for chest pain and palpitations.  Gastrointestinal: Negative for abdominal distention and abdominal pain.  Endocrine: Negative.   Genitourinary: Positive for dysuria. Negative for frequency.  Musculoskeletal: Positive for back pain. Negative for neck pain.  Skin: Negative.   Allergic/Immunologic: Negative.   Neurological: Negative for dizziness and light-headedness.  Hematological: Negative for adenopathy. Does not bruise/bleed easily.  Psychiatric/Behavioral: Negative for dysphoric mood and sleep disturbance (wearing bipap and oxygen). The patient is nervous/anxious.    Vitals:   08/29/16 1139  BP: 133/83  Pulse: 79  Resp: 18  SpO2: 96%  Weight: 263 lb (119.3 kg)  Height: _0  (1.6 m)    Wt Readings from Last 3 Encounters:  08/29/16 263 lb (119.3 kg)  08/01/16 267 lb (121.1 kg)  08/01/16 267 lb (121.1 kg)    Physical Exam  Constitutional:  She is oriented to person, place, and time. She appears well-developed and well-nourished.  HENT:  Head: Normocephalic and atraumatic.  Eyes: Conjunctivae are normal. Pupils are equal, round, and reactive to light.  Neck: Normal range of motion. Neck supple.  Cardiovascular: Normal rate and regular rhythm.   Pulmonary/Chest: Effort normal. She has no wheezes. She has no rales.  Abdominal: Soft. She exhibits no distension. There is no tenderness.  Musculoskeletal: She exhibits edema (2+ pitting edema in bilateral lower legs). She exhibits no tenderness.  Neurological: She is alert and oriented to person, place, and time.  Skin: Skin is warm and dry.  Psychiatric: She has a normal mood and affect. Her behavior is normal. Thought content normal.  Nursing note and vitals reviewed.    Assessment & Plan:  1: Chronic heart failure with preserved  ejection fraction- - NYHA class III - continues to be mildly volume overloaded today - weight is down though 4 pounds since last here on 08/01/16 - increase furosemide to 50m daily. May need to add potassium depending on lab results. Continue daily weights even when travelling with her husband - monitor sodium content carefully - check BMET today - saw cardiologist (Clayborn Bigness 08/01/16. Next appointment around April 2018.  2: HTN- - BP looks good today  3: Obstructive sleep apnea- - wearing bipap nightly - oxygen at 2.5L around the clock - last saw pulmonology (Raul Del on 06/27/16 and returns to him on 09/26/16  4: Tobacco use- - continues to smoke around 1ppd cigarettes - removes herself from the oxygen when smoking - complete cessation discussed for 3 minutes with her  5: Dysuria- - she says that she's having some pain upon urination - urinalysis ordered today  Return in 1 month or sooner for questions/problems before then.

## 2016-08-29 NOTE — Telephone Encounter (Signed)
Pt needs refill on her sertraline 176m.    [facility] [place]  Pt's call back is [phone] Thanks teri

## 2016-08-30 ENCOUNTER — Telehealth: Payer: Self-pay | Admitting: Family

## 2016-08-30 NOTE — Telephone Encounter (Signed)
Spoke with patient and advised her that her lab work that was drawn on 08/29/16 was all normal. Do not have her urinalysis results back yet.   Did increase her furosemide yesterday so would like her to begin 80mq potassium daily but she says that they've already left (husband is a tAdministrator and she won't be home for about 2 weeks. Advised her to eat 2 bananas daily and let me know when they [facility] somewhere [facility] to a [facility] and I could [facility] if she supplies me with the address/phone number of the store. Once she begins the potassium supplement, she doesn't have to eat the 2 bananas daily. Patient verbalized understanding of instructions.

## 2016-09-16 ENCOUNTER — Other Ambulatory Visit: Payer: Self-pay

## 2016-09-16 MED ORDER — POTASSIUM CHLORIDE CRYS ER 20 MEQ PO TBCR: 20.0000 meq | EXTENDED_RELEASE_TABLET | Freq: Every day | ORAL | 4 refills | Status: DC

## 2016-09-16 MED ORDER — POTASSIUM CHLORIDE CRYS ER 20 MEQ PO TBCR: 20.0000 meq | EXTENDED_RELEASE_TABLET | Freq: Every day | ORAL | 1 refills | Status: DC

## 2016-10-04 ENCOUNTER — Encounter: Payer: Self-pay | Admitting: Family Medicine

## 2016-10-04 ENCOUNTER — Ambulatory Visit (INDEPENDENT_AMBULATORY_CARE_PROVIDER_SITE_OTHER): Payer: Medicare PPO | Admitting: Family Medicine

## 2016-10-04 VITALS — BP 122/76 | HR 85 | Temp 98.2°F | Resp 18 | Wt 265.2 lb

## 2016-10-04 DIAGNOSIS — J449 Chronic obstructive pulmonary disease, unspecified: Secondary | ICD-10-CM | POA: Diagnosis not present

## 2016-10-04 DIAGNOSIS — J069 Acute upper respiratory infection, unspecified: Secondary | ICD-10-CM

## 2016-10-04 MED ORDER — PREDNISONE 10 MG PO TABS: 10.0000 mg | ORAL_TABLET | Freq: Every day | ORAL | 0 refills | Status: DC

## 2016-10-04 MED ORDER — DOXYCYCLINE HYCLATE 100 MG PO TABS: 100.0000 mg | ORAL_TABLET | Freq: Two times a day (BID) | ORAL | 0 refills | Status: DC

## 2016-10-04 NOTE — Progress Notes (Signed)
Patient: Theresa [name] Female    DOB: 02/18/1956   60 y.o.   MRN: [id number] Visit Date: 10/04/2016  Today's Provider: [name], PA   Chief Complaint  Patient presents with  . URI   Subjective:    URI   This is a new problem. Episode onset: Sunday. The problem has been unchanged. Associated symptoms include congestion, coughing, rhinorrhea and sneezing. She has tried nothing for the symptoms.   Past Medical History:  Diagnosis Date  . Anxiety   . Carpal tunnel syndrome   . CHF (congestive heart failure) ([place])   . Chronic generalized abdominal pain   . Chronic rhinitis   . COPD (chronic obstructive pulmonary disease) ([place])   . DDD (degenerative disc disease), cervical   . Depression   . Edema   . Gastritis   . Hematuria   . Hypertension   . Low back pain   . Lumbar radiculitis   . Malodorous urine   . Muscle weakness   . Obesity   . Renal cyst   . Sensory urge incontinence   . Tobacco abuse    Past Surgical History:  Procedure Laterality Date  . ABDOMINAL HYSTERECTOMY    . CARPAL TUNNEL RELEASE Left   . TONSILLECTOMY    . TUBAL LIGATION     Family History  Problem Relation Age of Onset  . Heart disease Mother   . Stroke Mother   . Coronary artery disease Mother   . Lung cancer Sister   . Alcohol abuse Father   . Heart disease Father   . Prostate cancer Neg Hx    Allergies  Allergen Reactions  . Codeine Nausea And Vomiting  . Meloxicam Other (See Comments)    Stomach pain     Previous Medications   ALBUTEROL (PROVENTIL HFA;VENTOLIN HFA) 108 (90 BASE) MCG/ACT INHALER    Inhale 2 puffs into the lungs every 6 (six) hours as needed for wheezing. Reported on 11/03/2015   ALPRAZOLAM (XANAX) 0.5 MG TABLET    Take 0.5 mg by mouth 2 (two) times daily as needed for anxiety.   ASPIRIN-CAFFEINE (BC FAST PAIN RELIEF ARTHRITIS) 1000-65 MG PACK    Take 1 packet by mouth every 6 (six) hours as needed (Pain).   BISOPROLOL-HYDROCHLOROTHIAZIDE (ZIAC) 10-6.25 MG  TABLET    TAKE 1 TABLET BY MOUTH DAILY   CHOLECALCIFEROL ([employer] D3) 2000 UNITS CAPS    Take 1 capsule by mouth daily.   FLUTICASONE-SALMETEROL (ADVAIR DISKUS) 250-50 MCG/DOSE AEPB    INHALE 1 PUFF INTO LUNGS TWICE A DAY AS NEEDED   FUROSEMIDE (LASIX) 40 MG TABLET    Take 1 tablet (40 mg total) by mouth daily.   FUROSEMIDE (LASIX) 40 MG TABLET    Take 1 tablet (40 mg total) by mouth daily.   HYDROCODONE-ACETAMINOPHEN (NORCO/VICODIN) 5-325 MG TABLET    Take 1 tablet by mouth every 8 (eight) hours as needed for severe pain.   HYDROCODONE-ACETAMINOPHEN (NORCO/VICODIN) 5-325 MG TABLET    Take 1 tablet by mouth every 8 (eight) hours as needed for severe pain.   HYDROCODONE-ACETAMINOPHEN (NORCO/VICODIN) 5-325 MG TABLET    Take 1 tablet by mouth every 8 (eight) hours as needed for severe pain.   IPRATROPIUM-ALBUTEROL (DUONEB) 0.5-2.5 (3) MG/3ML SOLN    Take 3 mLs by nebulization every 6 (six) hours as needed (shortness of breath).   OXYGEN    Inhale 2.5 L into the lungs continuous. Reported on 11/03/2015   POTASSIUM CHLORIDE SA (K-DUR,KLOR-CON) 20 MEQ  TABLET    Take 1 tablet (20 mEq total) by mouth daily.   ROFLUMILAST (DALIRESP) 500 MCG TABS TABLET    Take 500 mcg by mouth daily.   SERTRALINE (ZOLOFT) 100 MG TABLET    TAKE 1 TABLET (100 MG TOTAL) BY MOUTH DAILY.    Review of Systems  Constitutional: Negative.   HENT: Positive for congestion, rhinorrhea and sneezing.   Respiratory: Positive for cough.   Cardiovascular: Negative.     Social History  Substance Use Topics  . Smoking status: Current Every Day Smoker    Packs/day: 1.00    Years: 40.00    Types: Cigarettes  . Smokeless tobacco: Never Used     Comment: 30+ years, pt states she has cut back to 4-5 cigarettes a day  . Alcohol use No   Objective:   BP 122/76 (BP Location: Right Arm, Patient Position: Sitting, Cuff Size: Normal)   Pulse 85   Temp 98.2 F (36.8 C) (Oral)   Resp 18   Wt 265 lb 3.2 oz (120.3 kg)   SpO2 90%   BMI 46.98  kg/m   Physical Exam  Constitutional: She is oriented to person, place, and time. She appears well-developed and well-nourished. No distress.  HENT:  Head: Normocephalic and atraumatic.  Right Ear: Hearing and external ear normal.  Left Ear: Hearing and external ear normal.  Nose: Nose normal.  Slight cobblestoning of posterior pharynx without exudates.  Eyes: Conjunctivae and lids are normal. Right eye exhibits no discharge. Left eye exhibits no discharge. No scleral icterus.  Neck: Neck supple.  Cardiovascular: Normal rate and regular rhythm.   Pulmonary/Chest: She is in respiratory distress. She has wheezes. She has no rales.  Mild dyspnea - stable, baseline. Minimal coarseness of breath sounds. No rales or rhonchi today.  Abdominal: Soft.  Musculoskeletal: Normal range of motion.  Neurological: She is alert and oriented to person, place, and time.  Skin: Skin is intact. No lesion and no rash noted.  Psychiatric: She has a normal mood and affect. Her speech is normal and behavior is normal. Thought content normal.      Assessment & Plan:     1. Upper respiratory tract infection, unspecified type Onset with ticklish cough and swollen sore throat over the past past 2-3 days. No fever or purulent sputum production. May use OTC antihistamine and saltwater gargles for sore throat. Given Doxycycline prescription to use during the Christmas holiday if worsening. - doxycycline (VIBRA-TABS) 100 MG tablet; Take 1 tablet (100 mg total) by mouth 2 (two) times daily.  Dispense: 20 tablet; Refill: 0  2. Chronic obstructive pulmonary disease, unspecified COPD type ([place]) Still on oxygen 2 LPM continuously, Proventil-HFA prn rescue from wheezing, Advair diskus, Duoneb by nebulizer and Daliresp. Lungs seem fairly clear today without fever. Will give prednisone taper to prevent exacerbation and may add Mucinex-DM prn. Recheck as needed. - predniSONE (DELTASONE) 10 MG tablet; Take 1 tablet (10 mg  total) by mouth daily with breakfast. Taper down by one tablet daily (6,5,4,3,2,1)  Dispense: 21 tablet; Refill: 0

## 2016-10-05 ENCOUNTER — Ambulatory Visit: Payer: Self-pay | Admitting: Family

## 2016-10-24 ENCOUNTER — Inpatient Hospital Stay
Admission: EM | Admit: 2016-10-24 | Discharge: 2016-11-03 | DRG: 190 | Disposition: A | Discharge status: Home Health | Payer: Medicare PPO | Attending: Internal Medicine | Admitting: Internal Medicine

## 2016-10-24 ENCOUNTER — Emergency Department: Payer: Medicare PPO

## 2016-10-24 ENCOUNTER — Encounter: Payer: Self-pay | Admitting: Radiology

## 2016-10-24 DIAGNOSIS — F17210 Nicotine dependence, cigarettes, uncomplicated: Secondary | ICD-10-CM | POA: Diagnosis present

## 2016-10-24 DIAGNOSIS — J9620 Acute and chronic respiratory failure, unspecified whether with hypoxia or hypercapnia: Secondary | ICD-10-CM

## 2016-10-24 DIAGNOSIS — F329 Major depressive disorder, single episode, unspecified: Secondary | ICD-10-CM | POA: Diagnosis present

## 2016-10-24 DIAGNOSIS — M6281 Muscle weakness (generalized): Secondary | ICD-10-CM

## 2016-10-24 DIAGNOSIS — N200 Calculus of kidney: Secondary | ICD-10-CM | POA: Diagnosis present

## 2016-10-24 DIAGNOSIS — G8929 Other chronic pain: Secondary | ICD-10-CM | POA: Diagnosis present

## 2016-10-24 DIAGNOSIS — J441 Chronic obstructive pulmonary disease with (acute) exacerbation: Secondary | ICD-10-CM | POA: Diagnosis not present

## 2016-10-24 DIAGNOSIS — J9622 Acute and chronic respiratory failure with hypercapnia: Secondary | ICD-10-CM | POA: Diagnosis present

## 2016-10-24 DIAGNOSIS — R109 Unspecified abdominal pain: Secondary | ICD-10-CM

## 2016-10-24 DIAGNOSIS — E669 Obesity, unspecified: Secondary | ICD-10-CM | POA: Diagnosis present

## 2016-10-24 DIAGNOSIS — Z23 Encounter for immunization: Secondary | ICD-10-CM

## 2016-10-24 DIAGNOSIS — I5032 Chronic diastolic (congestive) heart failure: Secondary | ICD-10-CM | POA: Diagnosis present

## 2016-10-24 DIAGNOSIS — I509 Heart failure, unspecified: Secondary | ICD-10-CM

## 2016-10-24 DIAGNOSIS — Z9981 Dependence on supplemental oxygen: Secondary | ICD-10-CM

## 2016-10-24 DIAGNOSIS — J9600 Acute respiratory failure, unspecified whether with hypoxia or hypercapnia: Secondary | ICD-10-CM

## 2016-10-24 DIAGNOSIS — J9690 Respiratory failure, unspecified, unspecified whether with hypoxia or hypercapnia: Secondary | ICD-10-CM

## 2016-10-24 DIAGNOSIS — M5416 Radiculopathy, lumbar region: Secondary | ICD-10-CM | POA: Diagnosis present

## 2016-10-24 DIAGNOSIS — N3941 Urge incontinence: Secondary | ICD-10-CM | POA: Diagnosis present

## 2016-10-24 DIAGNOSIS — R0602 Shortness of breath: Secondary | ICD-10-CM | POA: Diagnosis not present

## 2016-10-24 DIAGNOSIS — Z79899 Other long term (current) drug therapy: Secondary | ICD-10-CM

## 2016-10-24 DIAGNOSIS — R2681 Unsteadiness on feet: Secondary | ICD-10-CM

## 2016-10-24 DIAGNOSIS — Z6841 Body Mass Index (BMI) 40.0 and over, adult: Secondary | ICD-10-CM

## 2016-10-24 DIAGNOSIS — F419 Anxiety disorder, unspecified: Secondary | ICD-10-CM | POA: Diagnosis present

## 2016-10-24 DIAGNOSIS — J9621 Acute and chronic respiratory failure with hypoxia: Secondary | ICD-10-CM | POA: Diagnosis present

## 2016-10-24 DIAGNOSIS — I110 Hypertensive heart disease with heart failure: Secondary | ICD-10-CM | POA: Diagnosis present

## 2016-10-24 LAB — CBC WITH DIFFERENTIAL/PLATELET
Basophils Absolute: 0.1 10*3/uL (ref 0–0.1)
Basophils Relative: 1 %
Eosinophils Absolute: 0.1 10*3/uL (ref 0–0.7)
Eosinophils Relative: 1 %
HCT: 42.9 % (ref 35.0–47.0)
Hemoglobin: 14 g/dL (ref 12.0–16.0)
Lymphocytes Relative: 14 %
Lymphs Abs: 1.7 10*3/uL (ref 1.0–3.6)
MCH: 32 pg (ref 26.0–34.0)
MCHC: 32.5 g/dL (ref 32.0–36.0)
MCV: 98.6 fL (ref 80.0–100.0)
Monocytes Absolute: 0.7 10*3/uL (ref 0.2–0.9)
Monocytes Relative: 5 %
Neutro Abs: 9.6 10*3/uL — ABNORMAL HIGH (ref 1.4–6.5)
Neutrophils Relative %: 79 %
Platelets: 195 10*3/uL (ref 150–440)
RBC: 4.36 MIL/uL (ref 3.80–5.20)
RDW: 15 % — ABNORMAL HIGH (ref 11.5–14.5)
WBC: 12.2 10*3/uL — ABNORMAL HIGH (ref 3.6–11.0)

## 2016-10-24 LAB — COMPREHENSIVE METABOLIC PANEL
ALT: 22 U/L (ref 14–54)
AST: 21 U/L (ref 15–41)
Albumin: 3.5 g/dL (ref 3.5–5.0)
Alkaline Phosphatase: 90 U/L (ref 38–126)
Anion gap: 7 (ref 5–15)
BUN: 13 mg/dL (ref 6–20)
CO2: 33 mmol/L — ABNORMAL HIGH (ref 22–32)
Calcium: 8.8 mg/dL — ABNORMAL LOW (ref 8.9–10.3)
Chloride: 103 mmol/L (ref 101–111)
Creatinine, Ser: 0.71 mg/dL (ref 0.44–1.00)
GFR calc Af Amer: >60 mL/min (ref >60)
GFR calc non Af Amer: >60 mL/min (ref >60)
Glucose, Bld: 130 mg/dL — ABNORMAL HIGH (ref 65–99)
Potassium: 3.8 mmol/L (ref 3.5–5.1)
Sodium: 143 mmol/L (ref 135–145)
Total Bilirubin: 0.4 mg/dL (ref 0.3–1.2)
Total Protein: 7.2 g/dL (ref 6.5–8.1)

## 2016-10-24 LAB — BLOOD GAS, VENOUS
Acid-Base Excess: 6.5 mmol/L — ABNORMAL HIGH (ref 0.0–2.0)
Bicarbonate: 36.1 mmol/L — ABNORMAL HIGH (ref 20.0–28.0)
Delivery systems: POSITIVE
FIO2: 0.28
O2 Saturation: 59.4 %
Patient temperature: 37
RATE: 8 resp/min
pCO2, Ven: 75 mmHg (ref 44.0–60.0)
pH, Ven: 7.29 (ref 7.250–7.430)
pO2, Ven: 35 mmHg (ref 32.0–45.0)

## 2016-10-24 LAB — MAGNESIUM: Magnesium: 2.1 mg/dL (ref 1.7–2.4)

## 2016-10-24 LAB — TROPONIN I: Troponin I: <0.03 ng/mL (ref <0.03)

## 2016-10-24 LAB — BRAIN NATRIURETIC PEPTIDE: B Natriuretic Peptide: 136 pg/mL — ABNORMAL HIGH (ref 0.0–100.0)

## 2016-10-24 MED ORDER — MAGNESIUM SULFATE 2 GM/50ML IV SOLN
2.0000 g | Freq: Once | INTRAVENOUS | Status: AC
  Administered 2016-10-24: 2 g via INTRAVENOUS
  Filled 2016-10-24: qty 50

## 2016-10-24 MED ORDER — IOPAMIDOL (ISOVUE-370) INJECTION 76%
75.0000 mL | Freq: Once | INTRAVENOUS | Status: AC | PRN
  Administered 2016-10-24: 75 mL via INTRAVENOUS

## 2016-10-24 MED ORDER — FUROSEMIDE 10 MG/ML IJ SOLN
60.0000 mg | Freq: Once | INTRAMUSCULAR | Status: AC
  Administered 2016-10-25: 60 mg via INTRAVENOUS
  Filled 2016-10-24: qty 8

## 2016-10-24 MED ORDER — METHYLPREDNISOLONE SODIUM SUCC 125 MG IJ SOLR
125.0000 mg | Freq: Once | INTRAMUSCULAR | Status: AC
  Administered 2016-10-24: 125 mg via INTRAVENOUS
  Filled 2016-10-24: qty 2

## 2016-10-24 MED ORDER — IPRATROPIUM-ALBUTEROL 0.5-2.5 (3) MG/3ML IN SOLN
3.0000 mL | Freq: Once | RESPIRATORY_TRACT | Status: AC
  Administered 2016-10-24: 3 mL via RESPIRATORY_TRACT

## 2016-10-24 MED ORDER — IPRATROPIUM-ALBUTEROL 0.5-2.5 (3) MG/3ML IN SOLN
RESPIRATORY_TRACT | Status: AC
  Administered 2016-10-24: 3 mL
  Filled 2016-10-24: qty 6

## 2016-10-24 NOTE — ED Triage Notes (Addendum)
Pt to triage via w/c, O2 in place at [place] via n/c (normally uses 2l/min with sat 95%); pt reports SHOB x week, worse tonight, nonprod cough; hx COPD; oxim 81% currently; charge nurse called for bed; pt taken immed to 17H, report called to [employer]

## 2016-10-24 NOTE — ED Provider Notes (Signed)
[facility] Emergency Department Provider Note    First MD Initiated Contact with Patient 10/24/16 2015     (approximate)  I have reviewed the triage vital signs and the nursing notes.   HISTORY  Chief Complaint Shortness of Breath    HPI Theresa [name] is a 61 y.o. female with a history of CHF as well as COPD presents with 1 week of progressively worsening shortness of breath and dry cough. States that today the patient became acutely more short of breath. She typically wears 2 L 2.5 L nasal cannula for chronic hypoxia. Patient uses multiple nebulizer treatments without any improvement. Is having some pleuritic chest pain. No measured fevers.  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 Does wear CPAP at home. Is not on any blood thinners. States that she noted her left lower extremity being more swollen over the past week. Still smoke. Past Medical History:  Diagnosis Date  . Anxiety   . Carpal tunnel syndrome   . CHF (congestive heart failure) ([place])   . Chronic generalized abdominal pain   . Chronic rhinitis   . COPD (chronic obstructive pulmonary disease) ([place])   . DDD (degenerative disc disease), cervical   . Depression   . Edema   . Gastritis   . Hematuria   . Hypertension   . Low back pain   . Lumbar radiculitis   . Malodorous urine   . Muscle weakness   . Obesity   . Renal cyst   . Sensory urge incontinence   . Tobacco abuse    Family History  Problem Relation Age of Onset  . Heart disease Mother   . Stroke Mother   . Coronary artery disease Mother   . Lung cancer Sister   . Alcohol abuse Father   . Heart disease Father   . Prostate cancer Neg Hx    Past Surgical History:  Procedure Laterality Date  . ABDOMINAL HYSTERECTOMY    . CARPAL TUNNEL RELEASE Left   . TONSILLECTOMY    . TUBAL LIGATION     Patient Active Problem List   Diagnosis Date Noted  . Supplemental oxygen dependent 08/01/2016  . Sleep apnea  06/02/2016  . Long term current use of opiate analgesic 01/12/2016  . Long term prescription opiate use 01/12/2016  . Opiate use (15 MME/Day) 01/12/2016  . Lumbar facet syndrome (Location of Primary Source of Pain) (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) 01/12/2016  . Chronic sacroiliac joint pain (Bilateral) 01/12/2016  . Vitamin D deficiency 12/30/2015  . Chronic hip pain (Location of Primary Source of Pain) (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) 12/28/2015  . 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 Obesity, Class III, BMI 40-49.9 (morbid obesity) ([place]) 12/28/2015  . Pulmonary hypertensive arterial disease 11/19/2015  . Coagulation disorder ([place]) 10/22/2015  . Chronic pain syndrome (significant psychosocial component) 09/21/2015  . 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 09/21/2015  . Chronic pain 09/16/2015  . Encounter for therapeutic drug level monitoring 09/16/2015  . Encounter for chronic pain management 09/16/2015  . Pain management 09/16/2015  . Neurogenic pain 09/16/2015  . Neuropathic pain 09/16/2015  . Musculoskeletal pain 09/16/2015  . Lumbar spondylosis (L4-5) 09/16/2015  . Chronic low back pain (Location of Primary Source of Pain) (Left) 09/16/2015  . Chronic lower extremity pain (Location of Secondary source of pain) (Left) 09/16/2015  . Chronic lumbar radicular pain (Location of Secondary source of pain) (S1 Dermatome) (Left) 09/16/2015  . Chronic shoulder pain (Right side) 09/16/2015  .  Chronic upper extremity pain (Right-sided) 09/16/2015  . Cervical radiculitis (Right side) 09/16/2015  . Abnormal x-ray of lumbar spine 09/16/2015  . Chronic diastolic heart failure ([place]) 07/15/2015  . Chest pain 07/15/2015  . Tobacco abuse 07/15/2015  . Blood clotting disorder ([place]) 04/21/2015  . Decreased motor strength 07/16/2014  . Clinical depression 07/16/2014  . Chronic rhinitis 07/16/2014  . Carpal tunnel syndrome 07/16/2014  . Anxiety state 07/16/2014  . Neuritis or  radiculitis due to rupture of lumbar intervertebral disc 07/02/2014  . DDD (degenerative disc disease), lumbar 07/02/2014  . CAFL (chronic airflow limitation) ([place]) 02/27/2014  . Chronic obstructive pulmonary disease ([place]) 02/27/2014  . Essential (primary) hypertension 03/27/2008      Prior to Admission medications   Medication Sig Start Date End Date Taking? Authorizing Provider  albuterol (PROVENTIL HFA;VENTOLIN HFA) 108 (90 BASE) MCG/ACT inhaler Inhale 2 puffs into the lungs every 6 (six) hours as needed for wheezing. Reported on 11/03/2015   Yes Historical Provider, MD  ALPRAZolam Duanne Moron) 0.5 MG tablet Take 0.5 mg by mouth 2 (two) times daily as needed for anxiety.   Yes Historical Provider, MD  Aspirin-Caffeine (BC FAST PAIN RELIEF ARTHRITIS) 1000-65 MG PACK Take 1 packet by mouth every 6 (six) hours as needed (Pain).   Yes Historical Provider, MD  bisoprolol-hydrochlorothiazide [facility]) 10-6.25 MG tablet TAKE 1 TABLET BY MOUTH DAILY 06/02/16  Yes Mar Daring, PA-C  Cholecalciferol ([employer]S D3) 2000 units CAPS Take 1 capsule by mouth daily.   Yes Historical Provider, MD  Fluticasone-Salmeterol (ADVAIR DISKUS) 250-50 MCG/DOSE AEPB INHALE 1 PUFF INTO LUNGS TWICE A DAY AS NEEDED 05/23/16  Yes Historical Provider, MD  furosemide (LASIX) 40 MG tablet Take 1 tablet (40 mg total) by mouth daily. 08/29/16  Yes [name], FNP  HYDROcodone-acetaminophen (NORCO/VICODIN) 5-325 MG tablet Take 1 tablet by mouth every 8 (eight) hours as needed for severe pain. 10/02/16 11/01/16 Yes Milinda Pointer, MD  ipratropium-albuterol (DUONEB) 0.5-2.5 (3) MG/3ML SOLN Take 3 mLs by nebulization every 6 (six) hours as needed (shortness of breath). 02/23/16  Yes Dennis E Chrismon, PA  OXYGEN Inhale 2.5 L into the lungs continuous. Reported on 11/03/2015   Yes Historical Provider, MD  potassium chloride SA (K-DUR,KLOR-CON) 20 MEQ tablet Take 1 tablet (20 mEq total) by mouth daily. 09/16/16  Yes [name], FNP    predniSONE (DELTASONE) 10 MG tablet Take 1 tablet (10 mg total) by mouth daily with breakfast. Taper down by one tablet daily (6,5,4,3,2,1) 10/04/16  Yes Vickki Muff Chrismon, PA  roflumilast (DALIRESP) 500 MCG TABS tablet Take 500 mcg by mouth daily.   Yes Historical Provider, MD  sertraline (ZOLOFT) 100 MG tablet TAKE 1 TABLET (100 MG TOTAL) BY MOUTH DAILY. 08/29/16  Yes Birdie Sons, MD  doxycycline (VIBRA-TABS) 100 MG tablet Take 1 tablet (100 mg total) by mouth 2 (two) times daily. Patient not taking: Reported on 10/24/2016 10/04/16   Vickki Muff Chrismon, PA  HYDROcodone-acetaminophen (NORCO/VICODIN) 5-325 MG tablet Take 1 tablet by mouth every 8 (eight) hours as needed for severe pain. 08/03/16 09/02/16  Milinda Pointer, MD  HYDROcodone-acetaminophen (NORCO/VICODIN) 5-325 MG tablet Take 1 tablet by mouth every 8 (eight) hours as needed for severe pain. 09/02/16 10/02/16  Milinda Pointer, MD    Allergies Codeine and Meloxicam    Social History Social History  Substance Use Topics  . Smoking status: Current Every Day Smoker    Packs/day: 1.00    Years: 40.00    Types: Cigarettes  .  Smokeless tobacco: Never Used     Comment: 30+ years, pt states she has cut back to 4-5 cigarettes a day  . Alcohol use No    Review of Systems Patient denies headaches, rhinorrhea, blurry vision, numbness, shortness of breath, chest pain, edema, cough, abdominal pain, nausea, vomiting, diarrhea, dysuria, fevers, rashes or hallucinations unless otherwise stated above in HPI. ____________________________________________   PHYSICAL EXAM:  VITAL SIGNS: Vitals:   10/24/16 2004 10/24/16 2145  BP: 136/76 114/65  Pulse: 93 85  Resp: (!) 30 (!) 26  Temp: 98.1 F (36.7 C)     Constitutional: Alert and oriented. Ill appearing in moderate respiratory distress Eyes: Conjunctivae are normal. PERRL. EOMI. Head: Atraumatic. Nose: No congestion/rhinnorhea. Mouth/Throat: Mucous membranes are moist.   Oropharynx non-erythematous. Neck: No stridor. Painless ROM. No cervical spine tenderness to palpation Hematological/Lymphatic/Immunilogical: No cervical lymphadenopathy. Cardiovascular: Normal rate, regular rhythm. Grossly normal heart sounds.  Good peripheral circulation. Respiratory: markedly tqachypnic with diffuse wheezing and diminished breathsounds throughout Gastrointestinal: Soft and nontender. No distention. No abdominal bruits. No CVA tenderness. Musculoskeletal: No lower extremity tenderness , 1+ LLE edema  No joint effusions. Neurologic:  Normal speech and language. No gross focal neurologic deficits are appreciated. No gait instability. Skin:  Skin is warm, dry and intact. No rash noted. Psychiatric: Mood and affect are normal. Speech and behavior are normal.  ____________________________________________   LABS (all labs ordered are listed, but only abnormal results are displayed)  Results for orders placed or performed during the hospital encounter of 10/24/16 (from the past 24 hour(s))  CBC with Differential/Platelet     Status: Abnormal   Collection Time: 10/24/16  8:23 PM  Result Value Ref Range   WBC 12.2 (H) 3.6 - 11.0 K/uL   RBC 4.36 3.80 - 5.20 MIL/uL   Hemoglobin 14.0 12.0 - 16.0 g/dL   HCT 42.9 35.0 - 47.0 %   MCV 98.6 80.0 - 100.0 fL   MCH 32.0 26.0 - 34.0 pg   MCHC 32.5 32.0 - 36.0 g/dL   RDW 15.0 (H) 11.5 - 14.5 %   Platelets 195 150 - 440 K/uL   Neutrophils Relative % 79 %   Neutro Abs 9.6 (H) 1.4 - 6.5 K/uL   Lymphocytes Relative 14 %   Lymphs Abs 1.7 1.0 - 3.6 K/uL   Monocytes Relative 5 %   Monocytes Absolute 0.7 0.2 - 0.9 K/uL   Eosinophils Relative 1 %   Eosinophils Absolute 0.1 0 - 0.7 K/uL   Basophils Relative 1 %   Basophils Absolute 0.1 0 - 0.1 K/uL  Comprehensive metabolic panel     Status: Abnormal   Collection Time: 10/24/16  8:23 PM  Result Value Ref Range   Sodium 143 135 - 145 mmol/L   Potassium 3.8 3.5 - 5.1 mmol/L   Chloride 103  101 - 111 mmol/L   CO2 33 (H) 22 - 32 mmol/L   Glucose, Bld 130 (H) 65 - 99 mg/dL   BUN 13 6 - 20 mg/dL   Creatinine, Ser 0.71 0.44 - 1.00 mg/dL   Calcium 8.8 (L) 8.9 - 10.3 mg/dL   Total Protein 7.2 6.5 - 8.1 g/dL   Albumin 3.5 3.5 - 5.0 g/dL   AST 21 15 - 41 U/L   ALT 22 14 - 54 U/L   Alkaline Phosphatase 90 38 - 126 U/L   Total Bilirubin 0.4 0.3 - 1.2 mg/dL   GFR calc non Af Amer >60 >60 mL/min   GFR calc Af  Amer >60 >60 mL/min   Anion gap 7 5 - 15  Troponin I     Status: None   Collection Time: 10/24/16  8:23 PM  Result Value Ref Range   Troponin I <0.03 <0.03 ng/mL  Magnesium     Status: None   Collection Time: 10/24/16  8:23 PM  Result Value Ref Range   Magnesium 2.1 1.7 - 2.4 mg/dL  Brain natriuretic peptide     Status: Abnormal   Collection Time: 10/24/16  8:23 PM  Result Value Ref Range   B Natriuretic Peptide 136.0 (H) 0.0 - 100.0 pg/mL   ____________________________________________  EKG My review and personal interpretation at Time: 20:38   Indication: sob  Rate: 85  Rhythm: sinus Axis: normal Other: non specific st changes, normal intervals ____________________________________________  RADIOLOGY  I personally reviewed all radiographic images ordered to evaluate for the above acute complaints and reviewed radiology reports and findings.  These findings were personally discussed with the patient.  Please see medical record for radiology report.  ____________________________________________   PROCEDURES  Procedure(s) performed:  Procedures    Critical Care performed: yes CRITICAL CARE Performed by: Merlyn Lot   Total critical care time: 45 minutes  Critical care time was exclusive of separately billable procedures and treating other patients.  Critical care was necessary to treat or prevent imminent or life-threatening deterioration.  Critical care was time spent personally by me on the following activities: development of treatment plan  with patient and/or surrogate as well as nursing, discussions with consultants, evaluation of patient's response to treatment, examination of patient, obtaining history from patient or surrogate, ordering and performing treatments and interventions, ordering and review of laboratory studies, ordering and review of radiographic studies, pulse oximetry and re-evaluation of patient's condition.  ____________________________________________   INITIAL IMPRESSION / ASSESSMENT AND PLAN / ED COURSE  Pertinent labs & imaging results that were available during my care of the patient were reviewed by me and considered in my medical decision making (see chart for details).  DDX: Asthma, copd, CHF, pna, ptx, malignancy, Pe, anemia   Theresa [name] is a 61 y.o. who presents to the ED with acute rest for distress. Patient afebrile but not to With acute hypoxic respiratory failure. Patient with mixed picture given her history of CHF as well as COPD. 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 Will give duonebs, supplemental O2 and steroids with magnesium. The patient will be placed on continuous pulse oximetry and telemetry for monitoring.  Laboratory evaluation will be sent to evaluate for the above complaints.     Clinical Course as of Oct 25 2319  Mon Oct 24, 2016  2147 Patient reassessed. No sick improvement after nebulizer treatments. Placed on BiPAP.  Do suspect there is some component of mixed pathology at this time with her underlying CHF as well as COPD. Currently awaiting CT angiogram.  [PR]  2259 CT imaging with no evidence of pulmonary embolism. There is some interstitial edema. Most likely congestive heart failure versus chronic bronchitis. Based on her productive cough with mild leukocytosis and acute hypoxic respiratory failure will start treatment for healthcare associated pneumonia she recently was treated with doxycycline.   Patient will need admission for further evaluation and management.  Have discussed with the patient and available family all diagnostics and treatments performed thus far and all questions were answered to the best of my ability. The patient demonstrates understanding and agreement with  plan.   [PR]  2304 I spoke with Dr. Malvin Johns who agrees to admit patient to the ICU.    [PR]    Clinical Course User Index [PR] Merlyn Lot, MD     ____________________________________________   FINAL CLINICAL IMPRESSION(S) / ED DIAGNOSES  Final diagnoses:  Acute respiratory failure with hypoxia ([place])  Acute on chronic congestive heart failure, unspecified congestive heart failure type ([place])  Chronic obstructive pulmonary disease with acute exacerbation ([facility])      NEW MEDICATIONS STARTED DURING THIS VISIT:  New Prescriptions   No medications on file     Note:  This document was prepared using Dragon voice recognition software and may include unintentional dictation errors.    Merlyn Lot, MD 10/24/16 [phone]

## 2016-10-25 DIAGNOSIS — M5416 Radiculopathy, lumbar region: Secondary | ICD-10-CM | POA: Diagnosis present

## 2016-10-25 DIAGNOSIS — Z9981 Dependence on supplemental oxygen: Secondary | ICD-10-CM | POA: Diagnosis not present

## 2016-10-25 DIAGNOSIS — G8929 Other chronic pain: Secondary | ICD-10-CM | POA: Diagnosis present

## 2016-10-25 DIAGNOSIS — J9600 Acute respiratory failure, unspecified whether with hypoxia or hypercapnia: Secondary | ICD-10-CM

## 2016-10-25 DIAGNOSIS — F419 Anxiety disorder, unspecified: Secondary | ICD-10-CM | POA: Diagnosis present

## 2016-10-25 DIAGNOSIS — Z6841 Body Mass Index (BMI) 40.0 and over, adult: Secondary | ICD-10-CM | POA: Diagnosis not present

## 2016-10-25 DIAGNOSIS — J9622 Acute and chronic respiratory failure with hypercapnia: Secondary | ICD-10-CM | POA: Diagnosis present

## 2016-10-25 DIAGNOSIS — F17210 Nicotine dependence, cigarettes, uncomplicated: Secondary | ICD-10-CM | POA: Diagnosis present

## 2016-10-25 DIAGNOSIS — F329 Major depressive disorder, single episode, unspecified: Secondary | ICD-10-CM | POA: Diagnosis present

## 2016-10-25 DIAGNOSIS — I5032 Chronic diastolic (congestive) heart failure: Secondary | ICD-10-CM | POA: Diagnosis present

## 2016-10-25 DIAGNOSIS — I110 Hypertensive heart disease with heart failure: Secondary | ICD-10-CM | POA: Diagnosis present

## 2016-10-25 DIAGNOSIS — J9621 Acute and chronic respiratory failure with hypoxia: Secondary | ICD-10-CM | POA: Diagnosis present

## 2016-10-25 DIAGNOSIS — F17200 Nicotine dependence, unspecified, uncomplicated: Secondary | ICD-10-CM

## 2016-10-25 DIAGNOSIS — N3941 Urge incontinence: Secondary | ICD-10-CM | POA: Diagnosis present

## 2016-10-25 DIAGNOSIS — Z23 Encounter for immunization: Secondary | ICD-10-CM | POA: Diagnosis present

## 2016-10-25 DIAGNOSIS — N200 Calculus of kidney: Secondary | ICD-10-CM | POA: Diagnosis present

## 2016-10-25 DIAGNOSIS — J441 Chronic obstructive pulmonary disease with (acute) exacerbation: Principal | ICD-10-CM

## 2016-10-25 DIAGNOSIS — Z79899 Other long term (current) drug therapy: Secondary | ICD-10-CM | POA: Diagnosis not present

## 2016-10-25 DIAGNOSIS — E669 Obesity, unspecified: Secondary | ICD-10-CM | POA: Diagnosis present

## 2016-10-25 DIAGNOSIS — R0602 Shortness of breath: Secondary | ICD-10-CM | POA: Diagnosis present

## 2016-10-25 LAB — CBC
HCT: 41.3 % (ref 35.0–47.0)
Hemoglobin: 13.6 g/dL (ref 12.0–16.0)
MCH: 32.3 pg (ref 26.0–34.0)
MCHC: 33 g/dL (ref 32.0–36.0)
MCV: 97.8 fL (ref 80.0–100.0)
Platelets: 201 10*3/uL (ref 150–440)
RBC: 4.23 MIL/uL (ref 3.80–5.20)
RDW: 15.3 % — ABNORMAL HIGH (ref 11.5–14.5)
WBC: 7.6 10*3/uL (ref 3.6–11.0)

## 2016-10-25 LAB — BASIC METABOLIC PANEL
Anion gap: 8 (ref 5–15)
BUN: 14 mg/dL (ref 6–20)
CO2: 34 mmol/L — ABNORMAL HIGH (ref 22–32)
Calcium: 8.4 mg/dL — ABNORMAL LOW (ref 8.9–10.3)
Chloride: 101 mmol/L (ref 101–111)
Creatinine, Ser: 0.62 mg/dL (ref 0.44–1.00)
GFR calc Af Amer: >60 mL/min (ref >60)
GFR calc non Af Amer: >60 mL/min (ref >60)
Glucose, Bld: 135 mg/dL — ABNORMAL HIGH (ref 65–99)
Potassium: 3.8 mmol/L (ref 3.5–5.1)
Sodium: 143 mmol/L (ref 135–145)

## 2016-10-25 LAB — RAPID INFLUENZA A&B ANTIGENS
Influenza A (ARMC): NEGATIVE
Influenza B (ARMC): NEGATIVE

## 2016-10-25 LAB — MRSA PCR SCREENING: MRSA by PCR: NEGATIVE

## 2016-10-25 LAB — BLOOD GAS, ARTERIAL
Acid-Base Excess: 9.8 mmol/L — ABNORMAL HIGH (ref 0.0–2.0)
Bicarbonate: 38.8 mmol/L — ABNORMAL HIGH (ref 20.0–28.0)
Delivery systems: POSITIVE
Expiratory PAP: 5
FIO2: 0.4
Inspiratory PAP: 15
O2 Saturation: 93.3 %
Patient temperature: 37
pCO2 arterial: 72 mmHg (ref 32.0–48.0)
pH, Arterial: 7.34 — ABNORMAL LOW (ref 7.350–7.450)
pO2, Arterial: 72 mmHg — ABNORMAL LOW (ref 83.0–108.0)

## 2016-10-25 LAB — GLUCOSE, CAPILLARY
Glucose-Capillary: 137 mg/dL — ABNORMAL HIGH (ref 65–99)
Glucose-Capillary: 148 mg/dL — ABNORMAL HIGH (ref 65–99)
Glucose-Capillary: 152 mg/dL — ABNORMAL HIGH (ref 65–99)
Glucose-Capillary: 143 mg/dL — ABNORMAL HIGH (ref 65–99)

## 2016-10-25 LAB — PROCALCITONIN: Procalcitonin: <0.1 ng/mL

## 2016-10-25 LAB — LACTIC ACID, PLASMA
Lactic Acid, Venous: 0.7 mmol/L (ref 0.5–1.9)
Lactic Acid, Venous: 0.7 mmol/L (ref 0.5–1.9)

## 2016-10-25 LAB — PHOSPHORUS: Phosphorus: 3.9 mg/dL (ref 2.5–4.6)

## 2016-10-25 LAB — MAGNESIUM: Magnesium: 2.3 mg/dL (ref 1.7–2.4)

## 2016-10-25 MED ORDER — ENOXAPARIN SODIUM 40 MG/0.4ML ~~LOC~~ SOLN
40.0000 mg | SUBCUTANEOUS | Status: DC
  Administered 2016-10-25: 40 mg via SUBCUTANEOUS

## 2016-10-25 MED ORDER — CEFEPIME-DEXTROSE 2 GM/50ML IV SOLR
2.0000 g | Freq: Once | INTRAVENOUS | Status: AC
Start: 2016-10-25 — End: 2016-10-25
  Administered 2016-10-25: 2 g via INTRAVENOUS
  Filled 2016-10-25: qty 50

## 2016-10-25 MED ORDER — PNEUMOCOCCAL VAC POLYVALENT 25 MCG/0.5ML IJ INJ
0.5000 mL | INJECTION | INTRAMUSCULAR | Status: AC
  Administered 2016-11-03: 0.5 mL via INTRAMUSCULAR
  Filled 2016-10-25: qty 0.5

## 2016-10-25 MED ORDER — NICOTINE 21 MG/24HR TD PT24
21.0000 mg | MEDICATED_PATCH | Freq: Every day | TRANSDERMAL | Status: DC
  Administered 2016-10-25 – 2016-11-03 (×10): 21 mg via TRANSDERMAL
  Filled 2016-10-25 (×10): qty 1

## 2016-10-25 MED ORDER — ALBUTEROL SULFATE (2.5 MG/3ML) 0.083% IN NEBU: 2.5000 mg | INHALATION_SOLUTION | RESPIRATORY_TRACT | Status: DC | PRN

## 2016-10-25 MED ORDER — BUDESONIDE 0.5 MG/2ML IN SUSP
0.5000 mg | Freq: Two times a day (BID) | RESPIRATORY_TRACT | Status: DC
  Administered 2016-10-25 – 2016-11-03 (×18): 0.5 mg via RESPIRATORY_TRACT
  Filled 2016-10-25 (×18): qty 2

## 2016-10-25 MED ORDER — IPRATROPIUM-ALBUTEROL 0.5-2.5 (3) MG/3ML IN SOLN
3.0000 mL | RESPIRATORY_TRACT | Status: DC
  Administered 2016-10-25 (×3): 3 mL via RESPIRATORY_TRACT
  Filled 2016-10-25 (×3): qty 3

## 2016-10-25 MED ORDER — CEFEPIME-DEXTROSE 2 GM/50ML IV SOLR
2.0000 g | Freq: Three times a day (TID) | INTRAVENOUS | Status: DC
  Filled 2016-10-25 (×3): qty 50

## 2016-10-25 MED ORDER — ENOXAPARIN SODIUM 40 MG/0.4ML ~~LOC~~ SOLN
40.0000 mg | SUBCUTANEOUS | Status: DC
Start: 2016-10-25 — End: 2016-10-25

## 2016-10-25 MED ORDER — FUROSEMIDE 40 MG PO TABS
40.0000 mg | ORAL_TABLET | Freq: Every day | ORAL | Status: DC
  Administered 2016-10-26 – 2016-11-02 (×8): 40 mg via ORAL
  Filled 2016-10-25 (×9): qty 1

## 2016-10-25 MED ORDER — HYDROCODONE-ACETAMINOPHEN 5-325 MG PO TABS
1.0000 | ORAL_TABLET | Freq: Three times a day (TID) | ORAL | Status: DC | PRN
  Administered 2016-10-25 (×2): 2 via ORAL
  Administered 2016-10-26 – 2016-10-27 (×4): 1 via ORAL
  Administered 2016-10-28: 2 via ORAL
  Administered 2016-10-29: 1 via ORAL
  Administered 2016-10-29 – 2016-11-03 (×10): 2 via ORAL
  Filled 2016-10-25 (×5): qty 2
  Filled 2016-10-25: qty 1
  Filled 2016-10-25: qty 2
  Filled 2016-10-25 (×3): qty 1
  Filled 2016-10-25 (×4): qty 2
  Filled 2016-10-25: qty 1
  Filled 2016-10-25 (×3): qty 2

## 2016-10-25 MED ORDER — ENOXAPARIN SODIUM 40 MG/0.4ML ~~LOC~~ SOLN
40.0000 mg | Freq: Two times a day (BID) | SUBCUTANEOUS | Status: DC
  Administered 2016-10-25 – 2016-11-03 (×19): 40 mg via SUBCUTANEOUS
  Filled 2016-10-25 (×20): qty 0.4

## 2016-10-25 MED ORDER — SERTRALINE HCL 100 MG PO TABS
100.0000 mg | ORAL_TABLET | Freq: Every day | ORAL | Status: DC
  Administered 2016-10-25 – 2016-11-02 (×9): 100 mg via ORAL
  Filled 2016-10-25 (×10): qty 1

## 2016-10-25 MED ORDER — IPRATROPIUM-ALBUTEROL 0.5-2.5 (3) MG/3ML IN SOLN: 3.0000 mL | Freq: Four times a day (QID) | RESPIRATORY_TRACT | Status: DC | PRN

## 2016-10-25 MED ORDER — CHLORHEXIDINE GLUCONATE 0.12 % MT SOLN
15.0000 mL | Freq: Two times a day (BID) | OROMUCOSAL | Status: DC
  Administered 2016-10-25 – 2016-11-03 (×16): 15 mL via OROMUCOSAL
  Filled 2016-10-25 (×16): qty 15

## 2016-10-25 MED ORDER — IPRATROPIUM-ALBUTEROL 0.5-2.5 (3) MG/3ML IN SOLN
3.0000 mL | Freq: Four times a day (QID) | RESPIRATORY_TRACT | Status: DC
  Administered 2016-10-25 – 2016-11-03 (×35): 3 mL via RESPIRATORY_TRACT
  Filled 2016-10-25 (×35): qty 3

## 2016-10-25 MED ORDER — ORAL CARE MOUTH RINSE
15.0000 mL | Freq: Two times a day (BID) | OROMUCOSAL | Status: DC
  Administered 2016-10-28 – 2016-11-01 (×7): 15 mL via OROMUCOSAL

## 2016-10-25 MED ORDER — METHYLPREDNISOLONE SODIUM SUCC 40 MG IJ SOLR
40.0000 mg | Freq: Two times a day (BID) | INTRAMUSCULAR | Status: DC
  Administered 2016-10-25 – 2016-10-29 (×9): 40 mg via INTRAVENOUS
  Filled 2016-10-25 (×10): qty 1

## 2016-10-25 MED ORDER — VANCOMYCIN HCL 10 G IV SOLR
1500.0000 mg | Freq: Two times a day (BID) | INTRAVENOUS | Status: DC
  Filled 2016-10-25 (×2): qty 1500

## 2016-10-25 MED ORDER — DEXTROSE 5 % IV SOLN: 1.0000 g | INTRAVENOUS | Status: DC

## 2016-10-25 MED ORDER — FUROSEMIDE 10 MG/ML IJ SOLN: 40.0000 mg | Freq: Two times a day (BID) | INTRAMUSCULAR | Status: DC

## 2016-10-25 MED ORDER — CEFTRIAXONE SODIUM-DEXTROSE 1-3.74 GM-% IV SOLR
1.0000 g | INTRAVENOUS | Status: DC
  Administered 2016-10-25 – 2016-10-27 (×3): 1 g via INTRAVENOUS
  Filled 2016-10-25 (×3): qty 50

## 2016-10-25 MED ORDER — SODIUM CHLORIDE 0.9 % IV SOLN: 250.0000 mL | INTRAVENOUS | Status: DC | PRN

## 2016-10-25 MED ORDER — VANCOMYCIN HCL 10 G IV SOLR
1500.0000 mg | Freq: Once | INTRAVENOUS | Status: AC
  Administered 2016-10-25: 1500 mg via INTRAVENOUS
  Filled 2016-10-25: qty 1500

## 2016-10-25 NOTE — H&P (Signed)
PULMONARY / CRITICAL CARE MEDICINE   Name: Theresa [name] MRN: [id number] DOB: 20-Apr-1956    ADMISSION DATE:  10/24/2016 CONSULTATION DATE:  10/25/2016  REFERRING MD:  Dr. Quentin Cornwall   CHIEF COMPLAINT:  Shortness of Breath   HISTORY OF PRESENT ILLNESS:   This is a 61 yo female with a PMH of Current everyday smoker (1 PPD), Renal cyst, Obesity, Gastritis, Depression, COPD, Chronic rhinitis, CHF, Hypertension, Chronic generalized abdominal pain, Lumbar radiculitis, and Anxiety.  She presented to [facility] ER 01/8 with c/o worsening shortness of breath, left leg edema, productive cough, and wheezing onset of symptoms 1 week ago.  She typically wears chronic home O2 2-2.5L and CPAP at night for chronic hypoxia.  She states she used multiple nebulizer treatments at home today due to worsening shortness of breath without improvement of symptoms prompting current visit to the ER.  She lives in a truck with her husband and states she recently traveled a long distance in the truck.  PCCM contacted 01/9 to admit pt to ICU for acute on chronic hypoxic hypercapnic respiratory failure secondary to AECOPD vs. Pulmonary edema.  PAST MEDICAL HISTORY :  She  has a past medical history of Anxiety; Carpal tunnel syndrome; CHF (congestive heart failure) ([place]); Chronic generalized abdominal pain; Chronic rhinitis; COPD (chronic obstructive pulmonary disease) (HCC); DDD (degenerative disc disease), cervical; Depression; Edema; Gastritis; Hematuria; Hypertension; Low back pain; Lumbar radiculitis; Malodorous urine; Muscle weakness; Obesity; Renal cyst; Sensory urge incontinence; and Tobacco abuse.  PAST SURGICAL HISTORY: She  has a past surgical history that includes Tubal ligation; Abdominal hysterectomy; Tonsillectomy; and Carpal tunnel release (Left).  Allergies  Allergen Reactions  . Codeine Nausea And Vomiting  . Meloxicam Other (See Comments)    Stomach pain    No current facility-administered medications on file  prior to encounter.    Current Outpatient Prescriptions on File Prior to Encounter  Medication Sig  . albuterol (PROVENTIL HFA;VENTOLIN HFA) 108 (90 BASE) MCG/ACT inhaler Inhale 2 puffs into the lungs every 6 (six) hours as needed for wheezing. Reported on 11/03/2015  . ALPRAZolam (XANAX) 0.5 MG tablet Take 0.5 mg by mouth 2 (two) times daily as needed for anxiety.  . Aspirin-Caffeine (BC FAST PAIN RELIEF ARTHRITIS) 1000-65 MG PACK Take 1 packet by mouth every 6 (six) hours as needed (Pain).  . bisoprolol-hydrochlorothiazide (ZIAC) 10-6.25 MG tablet TAKE 1 TABLET BY MOUTH DAILY  . Cholecalciferol ([employer] D3) 2000 units CAPS Take 1 capsule by mouth daily.  . Fluticasone-Salmeterol (ADVAIR DISKUS) 250-50 MCG/DOSE AEPB INHALE 1 PUFF INTO LUNGS TWICE A DAY AS NEEDED  . furosemide (LASIX) 40 MG tablet Take 1 tablet (40 mg total) by mouth daily.  Marland Kitchen HYDROcodone-acetaminophen (NORCO/VICODIN) 5-325 MG tablet Take 1 tablet by mouth every 8 (eight) hours as needed for severe pain.  Marland Kitchen ipratropium-albuterol (DUONEB) 0.5-2.5 (3) MG/3ML SOLN Take 3 mLs by nebulization every 6 (six) hours as needed (shortness of breath).  . OXYGEN Inhale 2.5 L into the lungs continuous. Reported on 11/03/2015  . potassium chloride SA (K-DUR,KLOR-CON) 20 MEQ tablet Take 1 tablet (20 mEq total) by mouth daily.  . predniSONE (DELTASONE) 10 MG tablet Take 1 tablet (10 mg total) by mouth daily with breakfast. Taper down by one tablet daily (6,5,4,3,2,1)  . roflumilast (DALIRESP) 500 MCG TABS tablet Take 500 mcg by mouth daily.  . sertraline (ZOLOFT) 100 MG tablet TAKE 1 TABLET (100 MG TOTAL) BY MOUTH DAILY.  Marland Kitchen doxycycline (VIBRA-TABS) 100 MG tablet Take 1 tablet (100 mg total)  by mouth 2 (two) times daily. (Patient not taking: Reported on 10/24/2016)  . HYDROcodone-acetaminophen (NORCO/VICODIN) 5-325 MG tablet Take 1 tablet by mouth every 8 (eight) hours as needed for severe pain.  Marland Kitchen HYDROcodone-acetaminophen (NORCO/VICODIN) 5-325 MG tablet  Take 1 tablet by mouth every 8 (eight) hours as needed for severe pain.    FAMILY HISTORY:  Her indicated that her mother is deceased. She indicated that her father is deceased. She indicated that her sister is deceased. She indicated that the status of her neg hx is unknown.    SOCIAL HISTORY: She  reports that she has been smoking Cigarettes.  She has a 40.00 pack-year smoking history. She has never used smokeless tobacco. She reports that she does not drink alcohol or use drugs.  REVIEW OF SYSTEMS:  Positives in BOLD Gen: Denies fever, chills, weight change, fatigue, night sweats HEENT: Denies blurred vision, double vision, hearing loss, tinnitus, sinus congestion, rhinorrhea, sore throat, neck stiffness, dysphagia PULM: shortness of breath, cough, sputum production, hemoptysis, wheezing CV: Denies chest pain, edema, orthopnea, paroxysmal nocturnal dyspnea, palpitations GI: abdominal pain, nausea, vomiting, diarrhea, hematochezia, melena, constipation, change in bowel habits GU: Denies dysuria, hematuria, polyuria, oliguria, urethral discharge Endocrine: Denies hot or cold intolerance, polyuria, polyphagia or appetite change Derm: Denies rash, dry skin, scaling or peeling skin change Heme: Denies easy bruising, bleeding, bleeding gums Neuro: Denies headache, numbness, weakness, slurred speech, loss of memory or consciousness   SUBJECTIVE:  Pt states her breathing has improved on Bipap currently resting in bed no acute distress  VITAL SIGNS: BP 114/65   Pulse 85   Temp 98.1 F (36.7 C) (Oral)   Resp (!) 26   Wt 120.2 kg (265 lb)   SpO2 98%   BMI 46.94 kg/m   HEMODYNAMICS:    VENTILATOR SETTINGS:    INTAKE / OUTPUT: No intake/output data recorded.  PHYSICAL EXAMINATION: General:  Acutely ill appearing obese Caucasian female  Neuro: Alert and oriented, follows commands, PERRLA  HEENT:  Supple, no JVD Cardiovascular: NSR, s1s2, no M/R/G Lungs:  Rhonchi and  inspiratory wheezes throughout, even, non labored on Bipap  Abdomen:  +BS x4, soft, obese, non tender, non distended  Musculoskeletal:  Normal tone 1+ non pitting edema left lower extremity  Skin:  Intact no rashes or lesions  LABS:  BMET  Recent Labs Lab 10/24/16 2023  NA 143  K 3.8  CL 103  CO2 33*  BUN 13  CREATININE 0.71  GLUCOSE 130*    Electrolytes  Recent Labs Lab 10/24/16 2023  CALCIUM 8.8*  MG 2.1    CBC  Recent Labs Lab 10/24/16 2023  WBC 12.2*  HGB 14.0  HCT 42.9  PLT 195    Coag's No results for input(s): APTT, INR in the last 168 hours.  Sepsis Markers  Recent Labs Lab 10/24/16 2344  LATICACIDVEN 0.7    ABG No results for input(s): PHART, PCO2ART, PO2ART in the last 168 hours.  Liver Enzymes  Recent Labs Lab 10/24/16 2023  AST 21  ALT 22  ALKPHOS 90  BILITOT 0.4  ALBUMIN 3.5    Cardiac Enzymes  Recent Labs Lab 10/24/16 2023  TROPONINI <0.03    Glucose No results for input(s): GLUCAP in the last 168 hours.  Imaging Dg Chest 2 View  Result Date: 10/24/2016 CLINICAL DATA:  Acute onset of shortness of breath and nonproductive cough. Decreased O2 saturation. Initial encounter. EXAM: CHEST  2 VIEW COMPARISON:  Chest radiograph performed 11/02/2015 FINDINGS: The lungs  are well-aerated. Vascular congestion is noted. Increased interstitial markings may reflect mild interstitial edema. There is no evidence of pleural effusion or pneumothorax. The heart is borderline enlarged. No acute osseous abnormalities are seen. IMPRESSION: Vascular congestion and borderline cardiomegaly. Increased interstitial markings may reflect mild interstitial edema. Electronically Signed   By: Garald Balding M.D.   On: 10/24/2016 20:53   Ct Angio Chest Pe W And/or Wo Contrast  Result Date: 10/24/2016 CLINICAL DATA:  Shortness of breath for 1 week, nonproductive cough. Symptoms worsening tonight. History of COPD, CHF. EXAM: CT ANGIOGRAPHY CHEST WITH  CONTRAST TECHNIQUE: Multidetector CT imaging of the chest was performed using the standard protocol during bolus administration of intravenous contrast. Multiplanar CT image reconstructions and MIPs were obtained to evaluate the vascular anatomy. CONTRAST:  75 cc Isovue 370 COMPARISON:  Chest radiograph October 25, 2015 2018 at 2045 hours FINDINGS: CARDIOVASCULAR: Adequate contrast opacification of the pulmonary artery's. Main pulmonary artery is enlarged at 3.5 cm. No pulmonary arterial filling defects to the level of the subsegmental branches. Heart size is mildly enlarged, no right heart strain. Moderate coronary artery calcifications. No pericardial effusions. Thoracic aorta is normal course and caliber, mild calcific atherosclerosis of the aortic arch. MEDIASTINUM/NODES: No lymphadenopathy by CT size criteria. Greater than expected number of subcentimeter mediastinal lymph nodes, likely reactive. A few small hilar lymph nodes. LUNGS/PLEURA: Tracheobronchial tree is patent, no pneumothorax. Interlobular septal thickening. No pleural effusion or focal consolidation. 3 mm RIGHT lower lobe sub solid pulmonary nodule, series 8, image 66/94 ; no indicated follow-up by size criteria. Bilateral upper lobe atelectasis. UPPER ABDOMEN: Included view of the abdomen is nonacute. 14 mm LEFT adrenal nodule. Numerous subcentimeter hypodense and liver most compatible with cysts. MUSCULOSKELETAL: Visualized soft tissues and included osseous structures are nonacute. Mild degenerative change of the thoracic spine. Review of the MIP images confirms the above findings. IMPRESSION: 1. No acute pulmonary embolism. Enlarged main pulmonary artery associated with pulmonary arterial hypertension. 2. Mild cardiomegaly. Interlobular septal thickening in mild bronchial wall thickening most compatible with pulmonary edema, there could be a chronic component underlying reactive airway disease/bronchitis. No focal consolidation. 3. Greater than  expected number of non pathologically enlarged mediastinal lymph nodes may be reactive. 4. **An incidental finding of potential clinical significance has been found. 14 mm LEFT adrenal nodule. Without history of cancer, this is probably benign. Consider 12 month follow-up adrenal CT. This recommendation follows ACR consensus guidelines: Management of Incidental Adrenal Masses: A White Paper of the ACR Incidental Findings Committee. J Am Coll Radiol 2017;14:1038-1044.** Electronically Signed   By: Elon Alas M.D.   On: 10/24/2016 21:45     STUDIES:  CT Angio Chest 01/8>>No acute pulmonary embolism. Enlarged main pulmonary artery associated with pulmonary arterial hypertension. Mild cardiomegaly. Interlobular septal thickening in mild bronchial wall thickening most compatible with pulmonary edema, there could be a chronic component underlying reactive airway disease/bronchitis. No focal consolidation. Greater than expected number of non pathologically enlarged mediastinal lymph nodes may be reactive.  An incidental finding of potential clinical significance has been found. 14 mm LEFT adrenal nodule. Without history of cancer, this is probably benign. Consider 12 month follow-up adrenal CT.  CULTURES: Blood x2 01/8>>  ANTIBIOTICS: Cefepime 01/9>> Vancomycin 01/9>>  SIGNIFICANT EVENTS: 01/9-Pt presented to [facility] ER with acute on chronic hypoxic hypercapnic respiratory failure secondary to AECOPD vs. Pulmonary edema   LINES/TUBES: PIV's x2 01/8>>   ASSESSMENT / PLAN:  PULMONARY A: Acute on chronic hypoxic hypercapnic respiratory failure secondary  to AECOPD vs. Pulmonary edema  Hx: Chronic home O2 at 2-2.5L, qhs CPAP, Current everyday smoker, and Obesity P:   Continuous Bipap for now wean as tolerated  Maintain O2 sats 88% to 92% IV steroids  Continue bronchodilators Prn CXR and ABG's Smoking Cessation Counseling   CARDIOVASCULAR A:  Congestive heart failure  Hx: HTN P:   Continuous telemetry monitoring Echo pending IV lasix   RENAL A:   No acute issues Hx: Sensory urge incontinence   P:   Trend BMP's Replace electrolytes as indicated  Monitor UOP  GASTROINTESTINAL A:   No acute issues Hx: Chronic abdominal pain  P:   Keep NPO for now until off Bipap  HEMATOLOGIC A:   14 mm LEFT adrenal nodule (Incidental finding on CT Angio Chest 01/8) P:  Lovenox for VTE prophylaxis  12 month follow-up CT scan for left adrenal nodule  Monitor for s/sx of bleeding Transfuse for hgb <7  INFECTIOUS A:   Mild Leukocytosis  P:   Trend WBC and monitor fever curve Trend PCT's and lactic acid Follow cultures   ENDOCRINE A:   No acute issues   P:   Follow serum glucose Hyper/hypoglycemic protocol   NEUROLOGIC A:   No acute issues Hx: Lumbar radiculitis and Depression P:   Avoid sedating medications   Marda Stalker, [place] Pager [phone] (please enter 7 digits) PCCM Consult Pager [phone] (please enter 7 digits)   PCCM ATTENDING ATTESTATION:  I have evaluated patient with the APP [place], reviewed database in its entirety and discussed care plan in detail. In addition, this patient was discussed on multidisciplinary rounds.   Important exam findings: Respiratory distress requiring BiPAP Diffuse expiratory wheezes  CXR and CT chest essentially clear  Major problems addressed by PCCM team: AECOPD Smoker Doubt CHF  PLAN/REC: PRN BiPAP Supplemental O2 Systemic steroids Nebulized steroids and bronchodilators Empiric antibiotics  Merton Border, MD PCCM service Mobile [phone] Pager [phone]

## 2016-10-25 NOTE — Progress Notes (Signed)
eLink Physician-Brief Progress Note Patient Name: Theresa [name] DOB: 30-Dec-1955 MRN: [id number]   Date of Service  10/25/2016  HPI/Events of Note  Patient requests Nicotine Patch.   eICU Interventions  Will order: 1. Nicotine patch 21 mg to skin now and daily.     Intervention Category Intermediate Interventions: Other:  Lysle Dingwall 10/25/2016, 3:25 PM

## 2016-10-25 NOTE — ED Provider Notes (Signed)
Clinical Course as of Oct 26 827  Mon Oct 24, 2016  2147 Patient reassessed. No sick improvement after nebulizer treatments. Placed on BiPAP.  Do suspect there is some component of mixed pathology at this time with her underlying CHF as well as COPD. Currently awaiting CT angiogram.  [PR]  2259 CT imaging with no evidence of pulmonary embolism. There is some interstitial edema. Most likely congestive heart failure versus chronic bronchitis. Based on her productive cough with mild leukocytosis and acute hypoxic respiratory failure will start treatment for healthcare associated pneumonia she recently was treated with doxycycline.  Patient will need admission for further evaluation and management.  Have discussed with the patient and available family all diagnostics and treatments performed thus far and all questions were answered to the best of my ability. The patient demonstrates understanding and agreement with plan.   [PR]  2304 I spoke with Dr. Malvin Johns who agrees to admit patient to the ICU.    [PR]  Tue Oct 25, 2016  0321 It has been four hours and no one from the ICU has been to see the patient.  She continues to require BiPap.  Dr. Quentin Cornwall previously spoke with the NP who is staffing the ICU and she had agreed to admit and manage the patient while she stays in the ED.  I called just now and spoke with the intensivist (possibly Dr. Alva Garnet, cannot remember name) and requested that he please call the NP so that she will come to the ED and admit and manage the patient so she can go upstairs once a bed is available and so that the patient can be managed by the ICU.  He said he will contact the NP.  [CF]  [phone] Spoke with NP in person.  She reports that she has been putting in medication orders and that she has been told by Dr. Mortimer Fries to not put in admission orders until a bed is available, or until we are closer to a time when a bed will be available (such as putting in admission orders at 6:45am anticipating  a bed opening at 7:30am).  She will continue to watch the patient for the next four hours and will put in admission orders before her shift is over.  [CF]    Clinical Course User Index [CF] Hinda Kehr, MD [PR] Merlyn Lot, MD      Hinda Kehr, MD 10/25/16 [phone]

## 2016-10-25 NOTE — Progress Notes (Signed)
[facility] responded to an OR for prayer in room IC-13. Pt was awake and alert. Pt asked for prayer on her condition and for the "crazyness of the world". CH provided the ministry of prayer and empathetic listening. CH is available for follow up as needed.   10/25/16 1000  Clinical Encounter Type  Visited With Patient  Visit Type Initial;Spiritual support  Referral From Nurse  Spiritual Encounters  Spiritual Needs Prayer;Emotional  Stress Factors  Patient Stress Factors Health changes

## 2016-10-25 NOTE — Progress Notes (Signed)
Pharmacy Antibiotic Note  Theresa [name] is a 61 y.o. female admitted on 10/24/2016 with pneumonia.  Pharmacy has been consulted for vancomycin and cefepime dosing.  Plan: DW 80kg   Vd 56L kei 0.083 he-1  T1/2 8 hours Vancomycin 1500 mg q 12 hours ordered with stacked dosing. Level before 5th dose. Goal trough 15-20.  Cefepime 2 grams q 8 hours ordered.  Weight: 265 lb (120.2 kg)  Temp (24hrs), Avg:98.1 F (36.7 C), Min:98.1 F (36.7 C), Max:98.1 F (36.7 C)   Recent Labs Lab 10/24/16 2023 10/24/16 2344 10/25/16 0237  WBC 12.2*  --   --   CREATININE 0.71  --   --   LATICACIDVEN  --  0.7 0.7    Estimated Creatinine Clearance: 95 mL/min (by C-G formula based on SCr of 0.71 mg/dL).    Allergies  Allergen Reactions  . Codeine Nausea And Vomiting  . Meloxicam Other (See Comments)    Stomach pain    Antimicrobials this admission: vancomycin 1/9 >>  cefepime 1/9 >>   Dose adjustments this admission:   Microbiology results: 1/8 BCx: pending    1/8 CXR: vascular congestion, edema  Thank you for allowing pharmacy to be a part of this patient's care.  Estela Vinal S 10/25/2016 3:49 AM

## 2016-10-25 NOTE — Progress Notes (Signed)
Patient cancelled her last HF Clinic appointment on 10/05/16. Have rescheduled it to November 08, 2016 at 10:30am. Thank you.

## 2016-10-25 NOTE — Progress Notes (Signed)
Lovenox changed to 40 mg BID for BMI >40 and CrCl >30. 

## 2016-10-26 DIAGNOSIS — J9621 Acute and chronic respiratory failure with hypoxia: Secondary | ICD-10-CM

## 2016-10-26 DIAGNOSIS — J9622 Acute and chronic respiratory failure with hypercapnia: Secondary | ICD-10-CM

## 2016-10-26 LAB — CBC
HCT: 39.9 % (ref 35.0–47.0)
Hemoglobin: 13 g/dL (ref 12.0–16.0)
MCH: 32.4 pg (ref 26.0–34.0)
MCHC: 32.6 g/dL (ref 32.0–36.0)
MCV: 99.1 fL (ref 80.0–100.0)
Platelets: 212 10*3/uL (ref 150–440)
RBC: 4.02 MIL/uL (ref 3.80–5.20)
RDW: 15.1 % — ABNORMAL HIGH (ref 11.5–14.5)
WBC: 10 10*3/uL (ref 3.6–11.0)

## 2016-10-26 LAB — GLUCOSE, CAPILLARY
Glucose-Capillary: 107 mg/dL — ABNORMAL HIGH (ref 65–99)
Glucose-Capillary: 118 mg/dL — ABNORMAL HIGH (ref 65–99)

## 2016-10-26 LAB — BASIC METABOLIC PANEL
Anion gap: 6 (ref 5–15)
BUN: 20 mg/dL (ref 6–20)
CO2: 36 mmol/L — ABNORMAL HIGH (ref 22–32)
Calcium: 8.9 mg/dL (ref 8.9–10.3)
Chloride: 99 mmol/L — ABNORMAL LOW (ref 101–111)
Creatinine, Ser: 0.5 mg/dL (ref 0.44–1.00)
GFR calc Af Amer: >60 mL/min (ref >60)
GFR calc non Af Amer: >60 mL/min (ref >60)
Glucose, Bld: 136 mg/dL — ABNORMAL HIGH (ref 65–99)
Potassium: 4.5 mmol/L (ref 3.5–5.1)
Sodium: 141 mmol/L (ref 135–145)

## 2016-10-26 MED ORDER — ALPRAZOLAM 0.5 MG PO TABS
0.5000 mg | ORAL_TABLET | Freq: Three times a day (TID) | ORAL | Status: DC | PRN
  Administered 2016-10-26 – 2016-11-02 (×11): 0.5 mg via ORAL
  Filled 2016-10-26 (×13): qty 1

## 2016-10-26 NOTE — Progress Notes (Signed)
PULMONARY / CRITICAL CARE MEDICINE   Name: Theresa [name] MRN: [id number] DOB: 11-18-1955    ADMISSION DATE:  10/24/2016 CONSULTATION DATE:  10/25/2016  REFERRING MD:  Dr. Quentin Cornwall   CHIEF COMPLAINT:  Shortness of Breath   HISTORY OF PRESENT ILLNESS:   This is a 61 yo female with a PMH of Current everyday smoker (1 PPD), Renal cyst, Obesity, Gastritis, Depression, COPD, Chronic rhinitis, CHF, Hypertension, Chronic generalized abdominal pain, Lumbar radiculitis, and Anxiety.  She presented to [facility] ER 01/8 with c/o worsening shortness of breath, left leg edema, productive cough, and wheezing onset of symptoms 1 week ago.  She typically wears chronic home O2 2-2.5L and CPAP at night for chronic hypoxia.  She states she used multiple nebulizer treatments at home today due to worsening shortness of breath without improvement of symptoms prompting current visit to the ER.  She lives in a truck with her husband and states she recently traveled a long distance in the truck.  PCCM contacted 01/9 to admit pt to ICU for acute on chronic hypoxic hypercapnic respiratory failure secondary to AECOPD vs. Pulmonary edema.  SUBJECTIVE:  Off and on BiPAP  VITAL SIGNS: BP 106/63   Pulse 90   Temp 98 F (36.7 C) (Axillary)   Resp 20   Ht _0  (1.6 m)   Wt 258 lb 2.5 oz (117.1 kg)   SpO2 93%   BMI 45.73 kg/m   HEMODYNAMICS:    VENTILATOR SETTINGS: FiO2 (%):  [40 %] 40 %  INTAKE / OUTPUT: I/O last 3 completed shifts: In: 890 [P.O.:240; IV Piggyback:650] Out: -   PHYSICAL EXAMINATION: General:  Well supported on BIPAP Neuro: NO focal deficits   HEENT: NCAT Cardiovascular: Reg, no M Lungs: Diffuse scattered wheezes  Abdomen:  Obese, soft, NT, +BS  Ext: trace symmetric edema  LABS:  BMET  Recent Labs Lab 10/24/16 2023 10/25/16 0613 10/26/16 0555  NA 143 143 141  K 3.8 3.8 4.5  CL 103 101 99*  CO2 33* 34* 36*  BUN _1 CREATININE 0.71 0.62 0.50  GLUCOSE 130* 135* 136*     Electrolytes  Recent Labs Lab 10/24/16 2023 10/25/16 0613 10/26/16 0555  CALCIUM 8.8* 8.4* 8.9  MG 2.1 2.3  --   PHOS  --  3.9  --     CBC  Recent Labs Lab 10/24/16 2023 10/25/16 0613 10/26/16 0555  WBC 12.2* 7.6 10.0  HGB 14.0 13.6 13.0  HCT 42.9 41.3 39.9  PLT 195 201 212    Coag's No results for input(s): APTT, INR in the last 168 hours.  Sepsis Markers  Recent Labs Lab 10/24/16 2344 10/25/16 0237  LATICACIDVEN 0.7 0.7  PROCALCITON  --  <0.10    ABG  Recent Labs Lab 10/25/16 0436  PHART 7.34*  PCO2ART 72*  PO2ART 72*    Liver Enzymes  Recent Labs Lab 10/24/16 2023  AST 21  ALT 22  ALKPHOS 90  BILITOT 0.4  ALBUMIN 3.5    Cardiac Enzymes  Recent Labs Lab 10/24/16 2023  TROPONINI <0.03    Glucose  Recent Labs Lab 10/25/16 0516 10/25/16 1108 10/25/16 1644 10/25/16 2127 10/26/16 0746 10/26/16 1202  GLUCAP 137* 148* 152* 143* 107* 118*    CXR: NNF   CULTURES: Blood x2 01/08 >> NEG  ANTIBIOTICS: Cefepime 01/9 X 1 Vancomycin 01/9 X 1 Ceftriaxone 01/09 >>    ASSESSMENT: Acute on chronic hypercarbic and hypoxemic respiratory failure AECOPD Smoker  PLAN: Cont PRN BiPAP,  Supplemental O2, Systemic steroids, Nebulized steroids and bronchodilators, Empiric antibiotics She is to remain in SDU until off BiPAP > 24 hrs Needs further counseling re: smoking cessation Recheck CXR AM 01/11   Merton Border, MD PCCM service Mobile [phone] Pager [phone] 10/26/2016

## 2016-10-27 ENCOUNTER — Ambulatory Visit: Payer: Medicare PPO | Admitting: Pain Medicine

## 2016-10-27 ENCOUNTER — Inpatient Hospital Stay: Payer: Medicare PPO

## 2016-10-27 LAB — BASIC METABOLIC PANEL
Anion gap: 6 (ref 5–15)
BUN: 26 mg/dL — ABNORMAL HIGH (ref 6–20)
CO2: 38 mmol/L — ABNORMAL HIGH (ref 22–32)
Calcium: 9.3 mg/dL (ref 8.9–10.3)
Chloride: 96 mmol/L — ABNORMAL LOW (ref 101–111)
Creatinine, Ser: 0.74 mg/dL (ref 0.44–1.00)
GFR calc Af Amer: >60 mL/min (ref >60)
GFR calc non Af Amer: >60 mL/min (ref >60)
Glucose, Bld: 117 mg/dL — ABNORMAL HIGH (ref 65–99)
Potassium: 4.3 mmol/L (ref 3.5–5.1)
Sodium: 140 mmol/L (ref 135–145)

## 2016-10-27 LAB — GLUCOSE, CAPILLARY
Glucose-Capillary: 116 mg/dL — ABNORMAL HIGH (ref 65–99)
Glucose-Capillary: 105 mg/dL — ABNORMAL HIGH (ref 65–99)
Glucose-Capillary: 111 mg/dL — ABNORMAL HIGH (ref 65–99)
Glucose-Capillary: 135 mg/dL — ABNORMAL HIGH (ref 65–99)

## 2016-10-27 LAB — MAGNESIUM: Magnesium: 2.3 mg/dL (ref 1.7–2.4)

## 2016-10-27 MED ORDER — POLYETHYLENE GLYCOL 3350 17 G PO PACK
17.0000 g | PACK | Freq: Every day | ORAL | Status: DC | PRN
  Administered 2016-10-28: 17 g via ORAL
  Filled 2016-10-27: qty 1

## 2016-10-27 MED ORDER — AZITHROMYCIN 500 MG PO TABS
500.0000 mg | ORAL_TABLET | Freq: Every day | ORAL | Status: AC
  Administered 2016-10-27 – 2016-10-30 (×4): 500 mg via ORAL
  Filled 2016-10-27 (×4): qty 1

## 2016-10-27 MED ORDER — AZITHROMYCIN 500 MG PO TABS: 500.0000 mg | ORAL_TABLET | Freq: Every day | ORAL | Status: DC

## 2016-10-27 MED ORDER — DOCUSATE SODIUM 100 MG PO CAPS
100.0000 mg | ORAL_CAPSULE | Freq: Two times a day (BID) | ORAL | Status: DC | PRN
  Administered 2016-10-27 – 2016-10-28 (×2): 100 mg via ORAL
  Filled 2016-10-27 (×2): qty 1

## 2016-10-27 NOTE — Progress Notes (Signed)
Patient having second run of SVT which was up to 190 per Central telemetry- first run up to 150- Dr. Mariel Kansky and Marda Stalker, NP made aware- Hinton Dyer ordered for Metb and magnesium serum.

## 2016-10-27 NOTE — Progress Notes (Signed)
PULMONARY / CRITICAL CARE MEDICINE   Name: Theresa [name] MRN: [id number] DOB: 10-13-1956    ADMISSION DATE:  10/24/2016 CONSULTATION DATE:  10/25/2016  REFERRING MD:  Dr. Quentin Cornwall   CHIEF COMPLAINT:  Shortness of Breath   HISTORY OF PRESENT ILLNESS:   This is a 61 yo female with a PMH of Current everyday smoker (1 PPD), Renal cyst, Obesity, Gastritis, Depression, COPD, Chronic rhinitis, CHF, Hypertension, Chronic generalized abdominal pain, Lumbar radiculitis, and Anxiety.  She presented to [facility] ER 01/8 with c/o worsening shortness of breath, left leg edema, productive cough, and wheezing onset of symptoms 1 week ago.  She typically wears chronic home O2 2-2.5L and CPAP at night for chronic hypoxia.  She states she used multiple nebulizer treatments at home today due to worsening shortness of breath without improvement of symptoms prompting current visit to the ER.  She lives in a truck with her husband and states she recently traveled a long distance in the truck.  PCCM contacted 01/9 to admit pt to ICU for acute on chronic hypoxic hypercapnic respiratory failure secondary to AECOPD vs. Pulmonary edema.  SUBJECTIVE:  Off and on BiPAP no new complaints at this time  VITAL SIGNS: BP (!) 135/114   Pulse 90   Temp 98 F (36.7 C) (Oral)   Resp 20   Ht _0  (1.6 m)   Wt 118.7 kg (261 lb 11 oz)   SpO2 90%   BMI 46.36 kg/m   HEMODYNAMICS:    VENTILATOR SETTINGS: FiO2 (%):  [40 %] 40 %  INTAKE / OUTPUT: I/O last 3 completed shifts: In: 58 [P.O.:480; IV Piggyback:50] Out: -   PHYSICAL EXAMINATION: General:  Obese Caucasian female, well developed, well nourished Neuro: No focal deficits, alert and oriented, follows commands   HEENT: NCAT Cardiovascular: Reg, no M/R/G Lungs: Diffuse scattered wheezes, even, non labored Abdomen:  Obese, soft, NT, non distended, +BS x4  Ext: trace symmetric edema  LABS:  BMET  Recent Labs Lab 10/24/16 2023 10/25/16 0613 10/26/16 0555  NA  143 143 141  K 3.8 3.8 4.5  CL 103 101 99*  CO2 33* 34* 36*  BUN _1 CREATININE 0.71 0.62 0.50  GLUCOSE 130* 135* 136*    Electrolytes  Recent Labs Lab 10/24/16 2023 10/25/16 0613 10/26/16 0555  CALCIUM 8.8* 8.4* 8.9  MG 2.1 2.3  --   PHOS  --  3.9  --     CBC  Recent Labs Lab 10/24/16 2023 10/25/16 0613 10/26/16 0555  WBC 12.2* 7.6 10.0  HGB 14.0 13.6 13.0  HCT 42.9 41.3 39.9  PLT 195 201 212    Coag's No results for input(s): APTT, INR in the last 168 hours.  Sepsis Markers  Recent Labs Lab 10/24/16 2344 10/25/16 0237  LATICACIDVEN 0.7 0.7  PROCALCITON  --  <0.10    ABG  Recent Labs Lab 10/25/16 0436  PHART 7.34*  PCO2ART 72*  PO2ART 72*    Liver Enzymes  Recent Labs Lab 10/24/16 2023  AST 21  ALT 22  ALKPHOS 90  BILITOT 0.4  ALBUMIN 3.5    Cardiac Enzymes  Recent Labs Lab 10/24/16 2023  TROPONINI <0.03    Glucose  Recent Labs Lab 10/25/16 2127 10/26/16 0746 10/26/16 1202 10/26/16 1703 10/27/16 0731 10/27/16 1105  GLUCAP 143* 107* 118* 116* 105* 111*    CXR: NNF   CULTURES: Blood x2 01/08 >> NEG  ANTIBIOTICS: Cefepime 01/9 X 1 Vancomycin 01/9 X 1 Ceftriaxone 01/09 >>  ASSESSMENT: Acute on chronic hypercarbic and hypoxemic respiratory failure AECOPD Smoker  PLAN: Bipap qhs Supplemental O2 to maintain O2 sats 88% to 92% Continue systemic steroids wean as tolerated Continue nebulized steroids and bronchodilators Empiric antibiotics Continued counseling re: smoking cessation Intermittent CXR Will transfer pt to telemetry unit  Marda Stalker, [place] Pager [phone] (please enter 7 digits) PCCM Consult Pager [phone] (please enter 7 digits)   PCCM ATTENDING ATTESTATION:  I have evaluated patient with the APP [place], reviewed database in its entirety and discussed care plan in detail. In addition, this patient was discussed on multidisciplinary rounds. Agree  with the above documentation. The care plan was supervised by me   Merton Border, MD PCCM service Mobile [phone] Pager [phone] 10/27/2016

## 2016-10-27 NOTE — Progress Notes (Signed)
12-lead came back with no acute changes, will give pt. PRN pain med to see if that has any effect.

## 2016-10-27 NOTE — Progress Notes (Signed)
Pt. C/o 5/10 Left sided, mid sternal to lateral chest pain that radiates to back. Pt. Does have chronic back pain, but stated this pain was "new".  Pt. Coughed for RN and stated this did not affect the pain, area not tender to touch. Pt. Stated this pain had been on-going since early afternoon, but even when asked pt. Had not reported it.   Pt. Educated on importance of reporting changes. VSS, rate and rhythm stable. Ms. Melina Fiddler, NP consulted who ordered a 12-lead EKG. Will continue to monitor pt. Closely.

## 2016-10-27 NOTE — Progress Notes (Signed)
Report called to 2A RN, Sabra Heck.  Discussed pt.'s admit Hx, current assessment and recent shift changes as well as periods of brief SVT during day shift. Called and Ms. Varughese to report pt.'s LBM 1/8, requesting a bowel regimen.  Ms. Melina Fiddler stated she would place appropriate orders.  Pt. Transported on 3 L [place], with Bipap for O/N use.  Pt. Also had purse, with wallet inside as well as a second pink bag, home oxygen machine, multiple food/drink bags.

## 2016-10-28 ENCOUNTER — Inpatient Hospital Stay: Payer: Medicare PPO

## 2016-10-28 LAB — CBC WITH DIFFERENTIAL/PLATELET
Basophils Absolute: 0 10*3/uL (ref 0–0.1)
Basophils Relative: 0 %
Eosinophils Absolute: 0 10*3/uL (ref 0–0.7)
Eosinophils Relative: 0 %
HCT: 40.6 % (ref 35.0–47.0)
Hemoglobin: 13.5 g/dL (ref 12.0–16.0)
Lymphocytes Relative: 6 %
Lymphs Abs: 0.6 10*3/uL — ABNORMAL LOW (ref 1.0–3.6)
MCH: 32.2 pg (ref 26.0–34.0)
MCHC: 33.2 g/dL (ref 32.0–36.0)
MCV: 97 fL (ref 80.0–100.0)
Monocytes Absolute: 0.4 10*3/uL (ref 0.2–0.9)
Monocytes Relative: 4 %
Neutro Abs: 8.2 10*3/uL — ABNORMAL HIGH (ref 1.4–6.5)
Neutrophils Relative %: 90 %
Platelets: 220 10*3/uL (ref 150–440)
RBC: 4.18 MIL/uL (ref 3.80–5.20)
RDW: 14.9 % — ABNORMAL HIGH (ref 11.5–14.5)
WBC: 9.2 10*3/uL (ref 3.6–11.0)

## 2016-10-28 LAB — BASIC METABOLIC PANEL
Anion gap: 5 (ref 5–15)
BUN: 24 mg/dL — ABNORMAL HIGH (ref 6–20)
CO2: 37 mmol/L — ABNORMAL HIGH (ref 22–32)
Calcium: 8.9 mg/dL (ref 8.9–10.3)
Chloride: 100 mmol/L — ABNORMAL LOW (ref 101–111)
Creatinine, Ser: 0.56 mg/dL (ref 0.44–1.00)
GFR calc Af Amer: >60 mL/min (ref >60)
GFR calc non Af Amer: >60 mL/min (ref >60)
Glucose, Bld: 129 mg/dL — ABNORMAL HIGH (ref 65–99)
Potassium: 4.3 mmol/L (ref 3.5–5.1)
Sodium: 142 mmol/L (ref 135–145)

## 2016-10-28 LAB — GLUCOSE, CAPILLARY
Glucose-Capillary: 116 mg/dL — ABNORMAL HIGH (ref 65–99)
Glucose-Capillary: 127 mg/dL — ABNORMAL HIGH (ref 65–99)
Glucose-Capillary: 111 mg/dL — ABNORMAL HIGH (ref 65–99)
Glucose-Capillary: 126 mg/dL — ABNORMAL HIGH (ref 65–99)

## 2016-10-28 LAB — TROPONIN I
Troponin I: <0.03 ng/mL (ref <0.03)
Troponin I: <0.03 ng/mL (ref <0.03)
Troponin I: <0.03 ng/mL (ref <0.03)

## 2016-10-28 MED ORDER — SODIUM CHLORIDE 0.9% FLUSH
3.0000 mL | Freq: Two times a day (BID) | INTRAVENOUS | Status: DC
  Administered 2016-10-28 – 2016-11-02 (×9): 3 mL via INTRAVENOUS

## 2016-10-28 NOTE — Care Management Important Message (Signed)
Important Message  Patient Details  Name: Theresa [name] MRN: [id number] Date of Birth: 1956-02-08   Medicare Important Message Given:  Yes  Initial signed IM printed from Epic and given to patient.     Katrina Stack, RN 10/28/2016, 8:51 AM

## 2016-10-28 NOTE — Progress Notes (Signed)
Pt placed on 2l [place] after nebulizer treatment.

## 2016-10-28 NOTE — Care Management (Signed)
Transferred to 2A late 11.11.2018.

## 2016-10-28 NOTE — Progress Notes (Signed)
PULMONARY / CRITICAL CARE MEDICINE   Name: Theresa [name] MRN: [id number] DOB: 1955-12-28    ADMISSION DATE:  10/24/2016 CONSULTATION DATE:  10/25/2016  REFERRING MD:  Dr. Quentin Cornwall   CHIEF COMPLAINT:  Shortness of Breath   HISTORY OF PRESENT ILLNESS:   This is a 61 yo female with a PMH of Current everyday smoker (1 PPD), Renal cyst, Obesity, Gastritis, Depression, COPD, Chronic rhinitis, CHF, Hypertension, Chronic generalized abdominal pain, Lumbar radiculitis, and Anxiety.  She presented to [facility] ER 01/8 with c/o worsening shortness of breath, left leg edema, productive cough, and wheezing onset of symptoms 1 week ago.  She typically wears chronic home O2 2-2.5L and CPAP at night for chronic hypoxia.  She states she used multiple nebulizer treatments at home today due to worsening shortness of breath without improvement of symptoms prompting current visit to the ER.  She lives in a truck with her husband and states she recently traveled a long distance in the truck.  PCCM contacted 01/9 to admit pt to ICU for acute on chronic hypoxic hypercapnic respiratory failure secondary to AECOPD vs. Pulmonary edema.  SUBJECTIVE:  Pt states she does not feel good today she is having left sided chest pain she is unable to describe the pain.  She states she has chest pain when she is short of breath, however this pain is different.  She is also complaining of wheezing and shortness of breath she is currently on 2L O2 via nasal canula.  VITAL SIGNS: BP 133/76 (BP Location: Left Arm)   Pulse 74   Temp 97.8 F (36.6 C) (Oral)   Resp 19   Ht _0  (1.6 m)   Wt 117.1 kg (258 lb 3.2 oz)   SpO2 98%   BMI 45.74 kg/m   HEMODYNAMICS:    VENTILATOR SETTINGS:    INTAKE / OUTPUT: I/O last 3 completed shifts: In: 65 [P.O.:480; IV Piggyback:50] Out: 652 [Urine:652]  PHYSICAL EXAMINATION: General:  Obese Caucasian female, well developed, well nourished Neuro: No focal deficits, alert and oriented, follows  commands   HEENT: NCAT Cardiovascular: NSR, s1s2, rrr, no M/R/G Lungs: Diffuse scattered wheezes throughout, even, mildly labored  Abdomen:  Obese, soft, NT, non distended, +BS x4  Ext: trace symmetric edema  LABS:  BMET  Recent Labs Lab 10/26/16 0555 10/27/16 1532 10/28/16 0531  NA 141 140 142  K 4.5 4.3 4.3  CL 99* 96* 100*  CO2 36* 38* 37*  BUN 20 26* 24*  CREATININE 0.50 0.74 0.56  GLUCOSE 136* 117* 129*    Electrolytes  Recent Labs Lab 10/24/16 2023 10/25/16 0613 10/26/16 0555 10/27/16 1532 10/28/16 0531  CALCIUM 8.8* 8.4* 8.9 9.3 8.9  MG 2.1 2.3  --  2.3  --   PHOS  --  3.9  --   --   --     CBC  Recent Labs Lab 10/25/16 0613 10/26/16 0555 10/28/16 0531  WBC 7.6 10.0 9.2  HGB 13.6 13.0 13.5  HCT 41.3 39.9 40.6  PLT 201 212 220    Coag's No results for input(s): APTT, INR in the last 168 hours.  Sepsis Markers  Recent Labs Lab 10/24/16 2344 10/25/16 0237  LATICACIDVEN 0.7 0.7  PROCALCITON  --  <0.10    ABG  Recent Labs Lab 10/25/16 0436  PHART 7.34*  PCO2ART 72*  PO2ART 72*    Liver Enzymes  Recent Labs Lab 10/24/16 2023  AST 21  ALT 22  ALKPHOS 90  BILITOT 0.4  ALBUMIN  3.5    Cardiac Enzymes  Recent Labs Lab 10/24/16 2023  TROPONINI <0.03    Glucose  Recent Labs Lab 10/26/16 1202 10/26/16 1703 10/27/16 0731 10/27/16 1105 10/27/16 1642 10/28/16 0731  GLUCAP 118* 116* 105* 111* 135* 116*    CXR: NNF   CULTURES: Blood x2 01/08 >> NEG  ANTIBIOTICS: Cefepime 01/9 X 1 Vancomycin 01/9 X 1 Ceftriaxone 01/09 >> 01/11 Azithromycin 01/11>>  ASSESSMENT: Acute on chronic hypercarbic and hypoxemic respiratory failure AECOPD Smoker Chest Pain  PLAN: Bipap qhs Supplemental O2 to maintain O2 sats 88% to 92% Continue systemic steroids wean as tolerated Continue nebulized steroids and bronchodilators Continue azithromycin Continued counseling re: smoking cessation CXR today Trend troponin's Upon  discharge she will need to follow-up with Dr. Raul Del in 2-3 weeks   Transferred service to hospitalist Dr. Dustin Flock 01/12, however PCCM will round on pt this weekend   Marda Stalker, [place] Pager [phone] (please enter 7 digits) PCCM Consult Pager [phone] (please enter 7 digits)  PCCM ATTENDING ATTESTATION: I have evaluated patient with the APP [place], reviewed database in its entirety and discussed care plan in detail.  She remains SOB with minimal exertion and continues to wheeze. CXR reveals increased bibasilar infiltrates that are likely atelectasis.  Assessment and plan as documented above. Transferred to Hospitalist Service but I will see at least once over [place]. She is a pt of Dr Raul Del so if she needs further Pulmonary follow next week, he will need to be contacted   Merton Border, MD PCCM service Mobile [phone] Pager [phone]

## 2016-10-28 NOTE — Progress Notes (Signed)
Increased to 3l after neb due to sats of 88% on 2l. Pt states she wears 2-3l at home. Will continue to monitor.

## 2016-10-29 LAB — CULTURE, BLOOD (ROUTINE X 2)
Culture: NO GROWTH
Culture: NO GROWTH

## 2016-10-29 LAB — GLUCOSE, CAPILLARY
Glucose-Capillary: 124 mg/dL — ABNORMAL HIGH (ref 65–99)
Glucose-Capillary: 103 mg/dL — ABNORMAL HIGH (ref 65–99)
Glucose-Capillary: 102 mg/dL — ABNORMAL HIGH (ref 65–99)
Glucose-Capillary: 138 mg/dL — ABNORMAL HIGH (ref 65–99)

## 2016-10-29 MED ORDER — MAGNESIUM HYDROXIDE 400 MG/5ML PO SUSP
15.0000 mL | Freq: Every day | ORAL | Status: DC | PRN
  Administered 2016-10-29 – 2016-11-01 (×3): 15 mL via ORAL
  Filled 2016-10-29 (×3): qty 30

## 2016-10-29 MED ORDER — PREDNISONE 20 MG PO TABS
40.0000 mg | ORAL_TABLET | Freq: Every day | ORAL | Status: DC
  Administered 2016-10-30 – 2016-11-01 (×3): 40 mg via ORAL
  Filled 2016-10-29 (×3): qty 2

## 2016-10-29 NOTE — Progress Notes (Signed)
Slightly more dyspneic today No overt distress  Vitals:   10/29/16 0442 10/29/16 0830 10/29/16 0835 10/29/16 1151  BP: (!) 126/52 118/74  134/70  Pulse: 82 87  88  Resp: _0 Temp: 97.8 F (36.6 C) 98.6 F (37 C)  99.4 F (37.4 C)  TempSrc: Oral Oral  Oral  SpO2: 99% 95% 93% 92%  Weight: 258 lb 9.6 oz (117.3 kg)     Height:       NAD HEENT WNL JVP not well visualized Diffuse scattered wheezes, decreased BS throughout Reg, no M Obese, soft, NT No C/C/E  BMP Latest Ref Rng & Units 10/28/2016 10/27/2016 10/26/2016  Glucose 65 - 99 mg/dL 129(H) 117(H) 136(H)  BUN 6 - 20 mg/dL 24(H) 26(H) 20  Creatinine 0.44 - 1.00 mg/dL 0.56 0.74 0.50  Sodium 135 - 145 mmol/L 142 140 141  Potassium 3.5 - 5.1 mmol/L 4.3 4.3 4.5  Chloride 101 - 111 mmol/L 100(L) 96(L) 99(L)  CO2 22 - 32 mmol/L 37(H) 38(H) 36(H)  Calcium 8.9 - 10.3 mg/dL 8.9 9.3 8.9   CBC Latest Ref Rng & Units 10/28/2016 10/26/2016 10/25/2016  WBC 3.6 - 11.0 K/uL 9.2 10.0 7.6  Hemoglobin 12.0 - 16.0 g/dL 13.5 13.0 13.6  Hematocrit 35.0 - 47.0 % 40.6 39.9 41.3  Platelets 150 - 440 K/uL 220 212 201   No new CXR  IMPRESSION: Acute on chronic hypoxic/hypercarbic respiratory failure AECOPD Smoker  PLAN/REC: Again discussed smoking cessation Cont supplemental O2, systemic steroid taper to off, nebulized steroids and bronchodilators  Complete course of abx as scheduled   [place] PCCM will sign off. If she still requires pulmonary follow up next week, please note that she is followed by Dr Raul Del as an outpt  Merton Border, MD PCCM service Mobile [phone] Pager [phone] 10/29/2016

## 2016-10-29 NOTE — Progress Notes (Signed)
[place] at [place] NAME: Theresa [name]    MR#:  [id number]  DATE OF BIRTH:  1956-06-24  SUBJECTIVE:   Patient still sob but improved since yesterday  REVIEW OF SYSTEMS:    Review of Systems  Constitutional: Negative.  Negative for chills, fever and malaise/fatigue.  HENT: Negative.  Negative for ear discharge, ear pain, hearing loss, nosebleeds and sore throat.   Eyes: Negative.  Negative for blurred vision and pain.  Respiratory: Positive for cough and shortness of breath. Negative for hemoptysis and wheezing.   Cardiovascular: Negative.  Negative for chest pain, palpitations and leg swelling.  Gastrointestinal: Negative.  Negative for abdominal pain, blood in stool, diarrhea, nausea and vomiting.  Genitourinary: Negative.  Negative for dysuria.  Musculoskeletal: Negative.  Negative for back pain.  Skin: Negative.   Neurological: Negative for dizziness, tremors, speech change, focal weakness, seizures and headaches.  Endo/Heme/Allergies: Negative.  Does not bruise/bleed easily.  Psychiatric/Behavioral: Negative.  Negative for depression, hallucinations and suicidal ideas.    Tolerating Diet: yes      DRUG ALLERGIES:   Allergies  Allergen Reactions  . Codeine Nausea And Vomiting  . Meloxicam Other (See Comments)    Stomach pain    VITALS:  Blood pressure 118/74, pulse 87, temperature 98.6 F (37 C), temperature source Oral, resp. rate 20, height _0  (1.6 m), weight 117.3 kg (258 lb 9.6 oz), SpO2 95 %.  PHYSICAL EXAMINATION:   Physical Exam  Constitutional: She is oriented to person, place, and time and well-developed, well-nourished, and in no distress. No distress.  Over weight  HENT:  Head: Normocephalic.  Eyes: No scleral icterus.  Neck: Normal range of motion. Neck supple. No JVD present. No tracheal deviation present.  Cardiovascular: Normal rate, regular rhythm and normal heart sounds.  Exam reveals no gallop  and no friction rub.   No murmur heard. Pulmonary/Chest: Effort normal. No respiratory distress. She has wheezes. She has no rales. She exhibits no tenderness.  Abdominal: Soft. Bowel sounds are normal. She exhibits no distension and no mass. There is no tenderness. There is no rebound and no guarding.  Musculoskeletal: Normal range of motion. She exhibits no edema.  Neurological: She is alert and oriented to person, place, and time.  Skin: Skin is warm. No rash noted. No erythema.  Psychiatric: Affect and judgment normal.      LABORATORY PANEL:   CBC  Recent Labs Lab 10/28/16 0531  WBC 9.2  HGB 13.5  HCT 40.6  PLT 220   ------------------------------------------------------------------------------------------------------------------  Chemistries   Recent Labs Lab 10/24/16 2023  10/27/16 1532 10/28/16 0531  NA 143  < > 140 142  K 3.8  < > 4.3 4.3  CL 103  < > 96* 100*  CO2 33*  < > 38* 37*  GLUCOSE 130*  < > 117* 129*  BUN 13  < > 26* 24*  CREATININE 0.71  < > 0.74 0.56  CALCIUM 8.8*  < > 9.3 8.9  MG 2.1  < > 2.3  --   AST 21  --   --   --   ALT 22  --   --   --   ALKPHOS 90  --   --   --   BILITOT 0.4  --   --   --   < > = values in this interval not displayed. ------------------------------------------------------------------------------------------------------------------  Cardiac Enzymes  Recent Labs Lab 10/28/16 1017 10/28/16 1547 10/28/16 2213  TROPONINI <0.03 <0.03 <0.03   ------------------------------------------------------------------------------------------------------------------  RADIOLOGY:  Dg Chest Port 1 View  Result Date: 10/28/2016 CLINICAL DATA:  Shortness of breath, cough. EXAM: PORTABLE CHEST 1 VIEW COMPARISON:  Radiograph of October 27, 2016. FINDINGS: Stable cardiomediastinal silhouette. No pneumothorax is noted. Mildly increased bibasilar opacities are noted, right greater than left, concerning for atelectasis or possibly  infiltrates. Bony thorax is unremarkable. IMPRESSION: Mildly increased bibasilar opacities are noted, concerning for atelectasis or possibly infiltrates. Electronically Signed   By: Marijo Conception, M.D.   On: 10/28/2016 10:31     ASSESSMENT AND PLAN:   61 year old female with tobacco dependence and COPD on chronic 2-1/2-3 L of oxygen at home who presented with acute on chronic hypoxic/hypercarbic respiratory failure due to COPD exacerbation.  1. Acute on chronic hypoxic/hypercarbic respiratory failure due to COPD exacerbation Patient now off BiPAP and on baseline oxygen Continue to wean IV steroids Continue bronchodilators and inhalers Continue azithromycin through tomorrow   2. Tobacco dependence: Patient was counseled on importance of smoking cessation. Patient does want to try to quit. Patient will need to be discharged with nicotine patch. Time spent on counseling 4 minutes.  3 depression on Zoloft  4. Chronic diastolic heart failure without exacerbation: Continue Lasix  5. Obesity: Encouraged weight loss as tolerated  PT consult for disposition  Management plans discussed with the patient and she is in agreement.  CODE STATUS: full  TOTAL TIME TAKING CARE OF THIS PATIENT: 27 minutes.     POSSIBLE D/C tomorrow, DEPENDING ON CLINICAL CONDITION.   Lum Stillinger M.D on 10/29/2016 at 9:18 AM  Between 7am to 6pm - Pager - [phone] After 6pm go to [email or web address] - password [email or web address] [facility]  Office  [phone]  CC: Primary care physician; [name], PA  Note: This dictation was prepared with Dragon dictation along with smaller phrase technology. Any transcriptional errors that result from this process are unintentional.

## 2016-10-30 LAB — GLUCOSE, CAPILLARY: Glucose-Capillary: 122 mg/dL — ABNORMAL HIGH (ref 65–99)

## 2016-10-30 MED ORDER — HYDROCODONE-ACETAMINOPHEN 5-325 MG PO TABS
1.0000 | ORAL_TABLET | Freq: Once | ORAL | Status: AC
  Administered 2016-10-30: 1 via ORAL
  Filled 2016-10-30: qty 1

## 2016-10-30 NOTE — Progress Notes (Signed)
Took pt off of bipap for neb tx and then placed on 3l [place]

## 2016-10-30 NOTE — Progress Notes (Signed)
[place] at [place] NAME: Theresa [name]    MR#:  [id number]  DATE OF BIRTH:  1956/02/09  SUBJECTIVE:   Patient still with wheezing and shortness of breath however improved since admission  REVIEW OF SYSTEMS:    Review of Systems  Constitutional: Negative.  Negative for chills, fever and malaise/fatigue.  HENT: Negative.  Negative for ear discharge, ear pain, hearing loss, nosebleeds and sore throat.   Eyes: Negative.  Negative for blurred vision and pain.  Respiratory: Positive for cough and shortness of breath. Negative for hemoptysis and wheezing.   Cardiovascular: Negative.  Negative for chest pain, palpitations and leg swelling.  Gastrointestinal: Negative.  Negative for abdominal pain, blood in stool, diarrhea, nausea and vomiting.  Genitourinary: Negative.  Negative for dysuria.  Musculoskeletal: Negative.  Negative for back pain.  Skin: Negative.   Neurological: Negative for dizziness, tremors, speech change, focal weakness, seizures and headaches.  Endo/Heme/Allergies: Negative.  Does not bruise/bleed easily.  Psychiatric/Behavioral: Negative.  Negative for depression, hallucinations and suicidal ideas.    Tolerating Diet: yes      DRUG ALLERGIES:   Allergies  Allergen Reactions  . Codeine Nausea And Vomiting  . Meloxicam Other (See Comments)    Stomach pain    VITALS:  Blood pressure (!) 104/57, pulse 85, temperature 97.7 F (36.5 C), temperature source Oral, resp. rate 20, height _0  (1.6 m), weight 117.1 kg (258 lb 1.6 oz), SpO2 94 %.  PHYSICAL EXAMINATION:   Physical Exam  Constitutional: She is oriented to person, place, and time and well-developed, well-nourished, and in no distress. No distress.  Over weight  HENT:  Head: Normocephalic.  Eyes: No scleral icterus.  Neck: Normal range of motion. Neck supple. No JVD present. No tracheal deviation present.  Cardiovascular: Normal rate, regular rhythm and  normal heart sounds.  Exam reveals no gallop and no friction rub.   No murmur heard. Pulmonary/Chest: Effort normal. No respiratory distress. She has wheezes. She has no rales. She exhibits no tenderness.  Abdominal: Soft. Bowel sounds are normal. She exhibits no distension and no mass. There is no tenderness. There is no rebound and no guarding.  Musculoskeletal: Normal range of motion. She exhibits no edema.  Neurological: She is alert and oriented to person, place, and time.  Skin: Skin is warm. No rash noted. No erythema.  Psychiatric: Affect and judgment normal.      LABORATORY PANEL:   CBC  Recent Labs Lab 10/28/16 0531  WBC 9.2  HGB 13.5  HCT 40.6  PLT 220   ------------------------------------------------------------------------------------------------------------------  Chemistries   Recent Labs Lab 10/24/16 2023  10/27/16 1532 10/28/16 0531  NA 143  < > 140 142  K 3.8  < > 4.3 4.3  CL 103  < > 96* 100*  CO2 33*  < > 38* 37*  GLUCOSE 130*  < > 117* 129*  BUN 13  < > 26* 24*  CREATININE 0.71  < > 0.74 0.56  CALCIUM 8.8*  < > 9.3 8.9  MG 2.1  < > 2.3  --   AST 21  --   --   --   ALT 22  --   --   --   ALKPHOS 90  --   --   --   BILITOT 0.4  --   --   --   < > = values in this interval not displayed. ------------------------------------------------------------------------------------------------------------------  Cardiac Enzymes  Recent Labs Lab 10/28/16  1017 10/28/16 1547 10/28/16 2213  TROPONINI <0.03 <0.03 <0.03   ------------------------------------------------------------------------------------------------------------------  RADIOLOGY:  No results found.   ASSESSMENT AND PLAN:   61 year old female with tobacco dependence and COPD on chronic 2-1/2-3 L of oxygen at home who presented with acute on chronic hypoxic/hypercarbic respiratory failure due to COPD exacerbation.  1. Acute on chronic hypoxic/hypercarbic respiratory failure due to  COPD exacerbation Patient now off BiPAP and on baseline oxygen IV steroids change oral steroids. Continue bronchodilators and inhalers Continue azithromycin through today   2. Tobacco dependence: Patient was counseled on importance of smoking cessation. Patient does want to try to quit. Patient will need to be discharged with nicotine patch. Time spent on counseling 4 minutes.  3 depression on Zoloft  4. Chronic diastolic heart failure without exacerbation: Continue Lasix  5. Obesity: Encouraged weight loss as tolerated  PT consult for disposition  Management plans discussed with the patient and she is in agreement.  CODE STATUS: full  TOTAL TIME TAKING CARE OF THIS PATIENT: 23 minutes.     POSSIBLE D/C tomorrow, DEPENDING ON CLINICAL CONDITION.   Branae Crail M.D on 10/30/2016 at 11:23 AM  Between 7am to 6pm - Pager - [phone] After 6pm go to [email or web address] - password [email or web address] [facility]  Office  [phone]  CC: Primary care physician; [name], PA  Note: This dictation was prepared with Dragon dictation along with smaller phrase technology. Any transcriptional errors that result from this process are unintentional.

## 2016-10-30 NOTE — Progress Notes (Signed)
PT Cancellation Note  Patient Details Name: Theresa [name] MRN: [id number] DOB: 07/27/1956   Cancelled Treatment:    Reason Eval/Treat Not Completed: Other (comment) Patient was reporting chest pain. Nurse was called and she asked PT to hold on Eval today due to patient just having pain medication and getting up from the bed to sit in the recliner.   Alanson Puls, PT, DPT [place], [place] 10/30/2016, 2:53 PM

## 2016-10-31 ENCOUNTER — Inpatient Hospital Stay: Payer: Medicare PPO

## 2016-10-31 LAB — URINALYSIS, COMPLETE (UACMP) WITH MICROSCOPIC
Bilirubin Urine: NEGATIVE
Glucose, UA: NEGATIVE mg/dL
Ketones, ur: NEGATIVE mg/dL
Leukocytes, UA: NEGATIVE
Nitrite: NEGATIVE
Protein, ur: NEGATIVE mg/dL
Specific Gravity, Urine: 1.017 (ref 1.005–1.030)
pH: 7 (ref 5.0–8.0)

## 2016-10-31 LAB — GLUCOSE, CAPILLARY
Glucose-Capillary: 87 mg/dL (ref 65–99)
Glucose-Capillary: 104 mg/dL — ABNORMAL HIGH (ref 65–99)
Glucose-Capillary: 147 mg/dL — ABNORMAL HIGH (ref 65–99)
Glucose-Capillary: 133 mg/dL — ABNORMAL HIGH (ref 65–99)

## 2016-10-31 LAB — CBC
HCT: 41.5 % (ref 35.0–47.0)
Hemoglobin: 13.8 g/dL (ref 12.0–16.0)
MCH: 32.7 pg (ref 26.0–34.0)
MCHC: 33.2 g/dL (ref 32.0–36.0)
MCV: 98.5 fL (ref 80.0–100.0)
Platelets: 177 10*3/uL (ref 150–440)
RBC: 4.21 MIL/uL (ref 3.80–5.20)
RDW: 15.3 % — ABNORMAL HIGH (ref 11.5–14.5)
WBC: 11.1 10*3/uL — ABNORMAL HIGH (ref 3.6–11.0)

## 2016-10-31 LAB — CREATININE, SERUM
Creatinine, Ser: 0.6 mg/dL (ref 0.44–1.00)
GFR calc Af Amer: >60 mL/min (ref >60)
GFR calc non Af Amer: >60 mL/min (ref >60)

## 2016-10-31 MED ORDER — DOXYCYCLINE HYCLATE 100 MG PO CAPS: 100.0000 mg | ORAL_CAPSULE | Freq: Two times a day (BID) | ORAL | 0 refills | Status: AC

## 2016-10-31 MED ORDER — TAMSULOSIN HCL 0.4 MG PO CAPS
0.4000 mg | ORAL_CAPSULE | Freq: Every day | ORAL | Status: DC
  Administered 2016-11-01 – 2016-11-02 (×3): 0.4 mg via ORAL
  Filled 2016-10-31 (×4): qty 1

## 2016-10-31 MED ORDER — PREDNISONE 10 MG PO TABS: ORAL_TABLET | ORAL | 0 refills | Status: DC

## 2016-10-31 MED ORDER — SODIUM CHLORIDE 0.9 % IV SOLN
INTRAVENOUS | Status: AC
  Administered 2016-11-01: 01:00:00 via INTRAVENOUS

## 2016-10-31 MED ORDER — SODIUM CHLORIDE 0.9 % IV SOLN: INTRAVENOUS | Status: DC

## 2016-10-31 MED ORDER — MORPHINE SULFATE (PF) 4 MG/ML IV SOLN
2.0000 mg | INTRAVENOUS | Status: DC | PRN
  Administered 2016-10-31 – 2016-11-01 (×3): 2 mg via INTRAVENOUS
  Filled 2016-10-31 (×3): qty 1

## 2016-10-31 MED ORDER — KETOROLAC TROMETHAMINE 15 MG/ML IJ SOLN
30.0000 mg | Freq: Once | INTRAMUSCULAR | Status: AC
  Administered 2016-10-31: 30 mg via INTRAVENOUS
  Filled 2016-10-31: qty 2

## 2016-10-31 NOTE — Progress Notes (Signed)
MD notified for urinalysis order. I will continue to assess.

## 2016-10-31 NOTE — Care Management (Signed)
Patient is reluctant to agree to home health nurse at discharge.  She will be staying in a hotel.  she and her husband live out of their 2 wheeler and are "on the road."  the truck is their hone.  Discussed that the physician has spoken with her about not going out on the road for at least a couple of weeks.  Discussed with patient that home health agencies are able to make home visits in a hotel room.  Patient will consider.
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
## 2016-10-31 NOTE — Care Management Important Message (Signed)
Important Message  Patient Details  Name: Kiley Solimine MRN: [id number] Date of Birth: 31-Jan-1956   Medicare Important Message Given:  Yes Signed copy placed in [place].  Filing discharge appeal   Katrina Stack, RN 10/31/2016, 4:09 PM

## 2016-10-31 NOTE — Progress Notes (Signed)
MD Bridgett Larsson notified. Pt complaining of pain after prn pain med. IV torodol given earlier with not relief. No new orders at this time. I will continue to assess.

## 2016-10-31 NOTE — Care Management (Signed)
Spoke with attending and patient is medically stable for discharge today.  Patient would benefit from home health nurse follow up. Went to discuss this with patient and patient is not in agreement because she still feels very short of breath.  "I think I need one more day."  At baseline patient is short of breath and on chronic 02 but she feels her breathing has not returned to her baseline.    She has elected to appeal her discharge.  notified [employer], Lavern Liz Malady. Joella Prince, Kathy Allmond and [employer].  Had patient sign Detailed Notice of discharge and IM notice. Explained the process.  Instructed patient and her daughter on the appeal process.  CM will still discuss home health services with patient and daughter after they have completed initiating the medicare appeal

## 2016-10-31 NOTE — Progress Notes (Signed)
Notified by [facility] of discharge appeal. Case ID [id number]. Care Management will fax the appropriate records. They have up to 72h to make a determination.

## 2016-10-31 NOTE — Progress Notes (Signed)
Pt still complaining of 8 out of 10 right sided flank pain, pt got Vicodin around 1830 that has not helped and also IV Toradol that did not help either. MD paged again, Dr. Jannifer Franklin to put in orders with IV morphine & also a CT scan to check for kidney stones. Will give pain medication and continue to monitor. Conley Simmonds, RN, BSN

## 2016-10-31 NOTE — Progress Notes (Signed)
[place] at [place] NAME: Theresa [name]    MR#:  [id number]  DATE OF BIRTH:  [birth year]  SUBJECTIVE:   Shortness of breath and wheezing much improved. Complaining of back pain. No other acute events overnight.   REVIEW OF SYSTEMS:    Review of Systems  Constitutional: Negative.  Negative for chills, fever and malaise/fatigue.  HENT: Negative.  Negative for ear discharge, ear pain, hearing loss, nosebleeds and sore throat.   Eyes: Negative.  Negative for blurred vision and pain.  Respiratory: Positive for cough and shortness of breath. Negative for hemoptysis and wheezing.   Cardiovascular: Negative.  Negative for chest pain, palpitations and leg swelling.  Gastrointestinal: Negative.  Negative for abdominal pain, blood in stool, diarrhea, nausea and vomiting.  Genitourinary: Negative.  Negative for dysuria.  Musculoskeletal: Negative.  Negative for back pain.  Skin: Negative.   Neurological: Negative for dizziness, tremors, speech change, focal weakness, seizures and headaches.  Endo/Heme/Allergies: Negative.  Does not bruise/bleed easily.  Psychiatric/Behavioral: Negative.  Negative for depression, hallucinations and suicidal ideas.    Tolerating Diet: yes   DRUG ALLERGIES:   Allergies  Allergen Reactions  . Codeine Nausea And Vomiting  . Meloxicam Other (See Comments)    Stomach pain    VITALS:  Blood pressure 136/69, pulse 98, temperature 98 F (36.7 C), temperature source Oral, resp. rate 18, height 5' 3" (1.6 m), weight 117.1 kg (258 lb 2.5 oz), SpO2 91 %.  PHYSICAL EXAMINATION:   Physical Exam  Constitutional: She is oriented to person, place, and time and well-developed, well-nourished, and in no distress. No distress.  Over weight  HENT:  Head: Normocephalic.  Eyes: No scleral icterus.  Neck: Normal range of motion. Neck supple. No JVD present. No tracheal deviation present.  Cardiovascular: Normal rate, regular  rhythm and normal heart sounds.  Exam reveals no gallop and no friction rub.   No murmur heard. Pulmonary/Chest: Effort normal. No respiratory distress. She has no wheezes. She has no rales. She exhibits no tenderness.  Prolonged insp & exp. Phase.    Abdominal: Soft. Bowel sounds are normal. She exhibits no distension and no mass. There is no tenderness. There is no rebound and no guarding.  Musculoskeletal: Normal range of motion. She exhibits no edema.  Neurological: She is alert and oriented to person, place, and time.  Skin: Skin is warm. No rash noted. No erythema.  Psychiatric: Affect and judgment normal.      LABORATORY PANEL:   CBC  Recent Labs Lab 10/31/16 0647  WBC 11.1*  HGB 13.8  HCT 41.5  PLT 177   ------------------------------------------------------------------------------------------------------------------  Chemistries   Recent Labs Lab 10/24/16 2023  10/27/16 1532 10/28/16 0531 10/31/16 0647  NA 143  < > 140 142  --   K 3.8  < > 4.3 4.3  --   CL 103  < > 96* 100*  --   CO2 33*  < > 38* 37*  --   GLUCOSE 130*  < > 117* 129*  --   BUN 13  < > 26* 24*  --   CREATININE 0.71  < > 0.74 0.56 0.60  CALCIUM 8.8*  < > 9.3 8.9  --   MG 2.1  < > 2.3  --   --   AST 21  --   --   --   --   ALT 22  --   --   --   --  ALKPHOS 90  --   --   --   --   BILITOT 0.4  --   --   --   --   < > = values in this interval not displayed. ------------------------------------------------------------------------------------------------------------------  Cardiac Enzymes  Recent Labs Lab 10/28/16 1017 10/28/16 1547 10/28/16 2213  TROPONINI <0.03 <0.03 <0.03   ------------------------------------------------------------------------------------------------------------------  RADIOLOGY:  No results found.   ASSESSMENT AND PLAN:   61 year old female with tobacco dependence and COPD on chronic 2-1/2-3 L of oxygen at home who presented with acute on chronic  hypoxic/hypercarbic respiratory failure due to COPD exacerbation.  1. Acute on chronic hypoxic/hypercarbic respiratory failure due to COPD exacerbation - Patient now off BiPAP and on baseline oxygen - weaned off IV steroids to Oral Pred.  Cont. Pulmicort nebs, duonebs and much improved since admission.  - Continue azithromycin   2. Tobacco dependence: Patient was counseled on importance of smoking cessation. Patient does want to try to quit. Patient will need to be discharged with nicotine patch.   3. Depression - cont. Zoloft  4. Chronic diastolic heart failure without exacerbation: Continue Lasix. No evidence of CHF.   5. Obesity: Encouraged weight loss as tolerated  6. Chronic Pain - cont. Vicodin.   PT recommending Home health Services and CM aware.  Pt. Was going to be discharged today but is appealing her discharge as she feels she is not ready for discharge.   Management plans discussed with the patient and she is in agreement.  CODE STATUS: full  TOTAL TIME TAKING CARE OF THIS PATIENT: 25 minutes.   POSSIBLE D/C 1 day, DEPENDING ON CLINICAL CONDITION.   Henreitta Leber M.D on 10/31/2016 at 4:51 PM  Between 7am to 6pm - Pager - [phone]  After 6pm go to [email or web address] - password [email or web address] [facility]  Office  [phone]  CC: Primary care physician; [name], PA  Note: This dictation was prepared with Dragon dictation along with smaller phrase technology. Any transcriptional errors that result from this process are unintentional.

## 2016-11-01 ENCOUNTER — Ambulatory Visit: Payer: Medicare PPO | Admitting: Pain Medicine

## 2016-11-01 LAB — CREATININE, SERUM
Creatinine, Ser: 0.74 mg/dL (ref 0.44–1.00)
GFR calc Af Amer: >60 mL/min (ref >60)
GFR calc non Af Amer: >60 mL/min (ref >60)

## 2016-11-01 LAB — GLUCOSE, CAPILLARY
Glucose-Capillary: 90 mg/dL (ref 65–99)
Glucose-Capillary: 108 mg/dL — ABNORMAL HIGH (ref 65–99)
Glucose-Capillary: 206 mg/dL — ABNORMAL HIGH (ref 65–99)
Glucose-Capillary: 145 mg/dL — ABNORMAL HIGH (ref 65–99)

## 2016-11-01 MED ORDER — ACETAMINOPHEN 325 MG PO TABS: 650.0000 mg | ORAL_TABLET | Freq: Four times a day (QID) | ORAL | Status: DC | PRN

## 2016-11-01 NOTE — Progress Notes (Signed)
[place] at [place] NAME: Theresa [name]    MR#:  [id number]  DATE OF BIRTH:  06-03-1956  (age 61)  SUBJECTIVE:   Still complaining of Back and right flank pain. Had CT SCan abd/pelvis showing non-obstructive renal stone which she thinks is causing her pain but unlikely.  Has appealed her discharge.    REVIEW OF SYSTEMS:    Review of Systems  Constitutional: Negative.  Negative for chills, fever and malaise/fatigue.  HENT: Negative.  Negative for ear discharge, ear pain, hearing loss, nosebleeds and sore throat.   Eyes: Negative.  Negative for blurred vision and pain.  Respiratory: Positive for cough and shortness of breath. Negative for hemoptysis and wheezing.   Cardiovascular: Negative.  Negative for chest pain, palpitations and leg swelling.  Gastrointestinal: Positive for abdominal pain (right flank). Negative for blood in stool, diarrhea, nausea and vomiting.  Genitourinary: Negative.  Negative for dysuria.  Musculoskeletal: Positive for back pain (right flank).  Skin: Negative.   Neurological: Negative for dizziness, tremors, speech change, focal weakness, seizures and headaches.  Endo/Heme/Allergies: Negative.  Does not bruise/bleed easily.  Psychiatric/Behavioral: Negative.  Negative for depression, hallucinations and suicidal ideas.    Tolerating Diet: yes   DRUG ALLERGIES:   Allergies  Allergen Reactions  . Codeine Nausea And Vomiting  . Meloxicam Other (See Comments)    Stomach pain    VITALS:  Blood pressure (!) 104/58, pulse (!) 103, temperature 98.2 F (36.8 C), temperature source Oral, resp. rate 16, height _0  (1.6 m), weight 121.9 kg (268 lb 11.2 oz), SpO2 90 %.  PHYSICAL EXAMINATION:   Physical Exam  Constitutional: She is oriented to person, place, and time and well-developed, well-nourished, and in no distress. No distress.  Over weight  HENT:  Head: Normocephalic.  Eyes: No scleral icterus.  Neck: Normal range  of motion. Neck supple. No JVD present. No tracheal deviation present.  Cardiovascular: Normal rate, regular rhythm and normal heart sounds.  Exam reveals no gallop and no friction rub.   No murmur heard. Pulmonary/Chest: Effort normal. No respiratory distress. She has no wheezes. She has no rales. She exhibits no tenderness.  Prolonged insp & exp. Phase.    Abdominal: Soft. Bowel sounds are normal. She exhibits no distension and no mass. There is tenderness (right flank). There is no rebound and no guarding.  Musculoskeletal: Normal range of motion. She exhibits no edema.  Neurological: She is alert and oriented to person, place, and time.  Skin: Skin is warm. No rash noted. No erythema.  Psychiatric: Affect and judgment normal.      LABORATORY PANEL:   CBC  Recent Labs Lab 10/31/16 0647  WBC 11.1*  HGB 13.8  HCT 41.5  PLT 177   ------------------------------------------------------------------------------------------------------------------  Chemistries   Recent Labs Lab 10/27/16 1532 10/28/16 0531  11/01/16 0508  NA 140 142  --   --   K 4.3 4.3  --   --   CL 96* 100*  --   --   CO2 38* 37*  --   --   GLUCOSE 117* 129*  --   --   BUN 26* 24*  --   --   CREATININE 0.74 0.56  < > 0.74  CALCIUM 9.3 8.9  --   --   MG 2.3  --   --   --   < > = values in this interval not displayed. ------------------------------------------------------------------------------------------------------------------  Cardiac Enzymes  Recent Labs Lab 10/28/16 1017 10/28/16  1547 10/28/16 2213  TROPONINI <0.03 <0.03 <0.03   ------------------------------------------------------------------------------------------------------------------  RADIOLOGY:  Ct Renal Stone Study  Result Date: 10/31/2016 CLINICAL DATA:  Right flank pain this morning. EXAM: CT ABDOMEN AND PELVIS WITHOUT CONTRAST TECHNIQUE: Multidetector CT imaging of the abdomen and pelvis was performed following the standard  protocol without IV contrast. COMPARISON:  CT abdomen dated 07/21/2014 FINDINGS: Lower chest: Mild atelectasis/ scarring at each lung base. Hepatobiliary: Small hypodense foci are again seen within the liver, too small to definitively characterize but not significantly changed compared to the previous exam and compatible with benign cysts. No acute or suspicious finding within the liver. Gallbladder is contracted. No bile duct dilatation seen. Pancreas: Unremarkable. No pancreatic ductal dilatation or surrounding inflammatory changes. Spleen: Normal in size without focal abnormality. Adrenals/Urinary Tract: Adrenal glands appear normal. Punctate nonobstructing right renal stone. 3 mm stone within the proximal left ureter without associated hydronephrosis. Bladder appears normal, partially decompressed. Stomach/Bowel: Bowel is normal in caliber. No bowel wall thickening or evidence of bowel wall inflammation seen. Appendix is normal. Stomach appears normal. Vascular/Lymphatic: Heavy atherosclerotic changes of the normal caliber abdominal aorta and pelvic vasculature. No enlarged lymph nodes identified in the abdomen or pelvis. Reproductive: cystic-appearing mass within the right adnexa measuring 3.4 cm greatest dimension. Otherwise unremarkable. Other: No free fluid or abscess collection. No free intraperitoneal air. Musculoskeletal: Degenerative change in the lower lumbar spine, mild to moderate in degree. No acute or suspicious osseous finding. Periumbilical abdominal wall hernia which contains fat only. Small foci of air within the subcutaneous soft tissues of the left abdominal wall, presumably related to recent injection site. IMPRESSION: 1. 3 mm stone within the proximal left ureter without associated hydronephrosis. Additional punctate nonobstructing right renal stone. 2. Cystic-appearing mass within the right adnexa measuring 3.4 cm greatest dimension. If premenopausal or perimenopausal, this is most  compatible with a benign ovarian cyst. If postmenopausal, recommend follow-up with pelvic ultrasound to ensure benignity. 3. Aortic atherosclerosis. Electronically Signed   By: Franki Cabot M.D.   On: 10/31/2016 22:08     ASSESSMENT AND PLAN:   61 year old female with tobacco dependence and COPD on chronic 2-1/2-3 L of oxygen at home who presented with acute on chronic hypoxic/hypercarbic respiratory failure due to COPD exacerbation.  1. Acute on chronic hypoxic/hypercarbic respiratory failure due to COPD exacerbation - Patient now off BiPAP and on baseline oxygen - weaned off IV steroids to Oral Pred.  Cont. Pulmicort nebs, duonebs and much improved since admission and stable for discharge but has appealed her discharge with medicare.  - Continue azithromycin   2. Tobacco dependence - cont. Nicotine patch.   3. Depression - cont. Zoloft  4. Chronic diastolic heart failure without exacerbation: Continue Lasix. No evidence of CHF.   5. Obesity: Encouraged weight loss as tolerated  6. Chronic Pain - cont. Vicodin.   7. Right flank/lower back pain - Musculoskeletal in nature. Patient had a CT renal stone study which was negative. She has a small nonobstructive renal stone on the right without hydronephrosis. I have discontinued her IV morphine. Continue oral Vicodin.  Patient has appealed the discharge and we're awaiting decision from Medicare.  Management plans discussed with the patient and she is in agreement.  CODE STATUS: full  TOTAL TIME TAKING CARE OF THIS PATIENT: 30 minutes.   POSSIBLE D/C ?? , DEPENDING ON CLINICAL CONDITION.   Henreitta Leber M.D on 11/01/2016 at 5:56 PM  Between 7am to 6pm - Pager - [phone]  After 6pm  go to [email or web address] - password [email or web address] [facility]  Office  [phone]  CC: Primary care physician; [name], PA  Note: This dictation was prepared with Dragon dictation along with smaller phrase technology. Any  transcriptional errors that result from this process are unintentional.

## 2016-11-01 NOTE — Care Management (Signed)
Appeal is in the clinical review phase

## 2016-11-01 NOTE — Plan of Care (Signed)
Problem: Fluid Volume: Goal: Ability to maintain a balanced intake and output will improve Outcome: Progressing Pt with new flank pain, MD paged, asked if pt had a history of kidney stones, turns out she did.So Dr. Jannifer Franklin ordered a Renal CT, which did show a stone. IVF & Flomax were started.

## 2016-11-02 LAB — GLUCOSE, CAPILLARY
Glucose-Capillary: 85 mg/dL (ref 65–99)
Glucose-Capillary: 118 mg/dL — ABNORMAL HIGH (ref 65–99)

## 2016-11-02 MED ORDER — IPRATROPIUM-ALBUTEROL 0.5-2.5 (3) MG/3ML IN SOLN: 3.0000 mL | Freq: Four times a day (QID) | RESPIRATORY_TRACT | 3 refills | Status: DC | PRN

## 2016-11-02 MED ORDER — TAMSULOSIN HCL 0.4 MG PO CAPS: 0.4000 mg | ORAL_CAPSULE | Freq: Every day | ORAL | 0 refills | Status: DC

## 2016-11-02 MED ORDER — PREDNISONE 20 MG PO TABS
20.0000 mg | ORAL_TABLET | Freq: Every day | ORAL | Status: DC
  Administered 2016-11-03: 20 mg via ORAL
  Filled 2016-11-02: qty 1

## 2016-11-02 MED ORDER — ALBUTEROL SULFATE HFA 108 (90 BASE) MCG/ACT IN AERS: 2.0000 | INHALATION_SPRAY | Freq: Four times a day (QID) | RESPIRATORY_TRACT | 2 refills | Status: DC | PRN

## 2016-11-02 NOTE — Discharge Instructions (Signed)
Heart Failure Clinic appointment on November 08, 2016 at 10:30am with Darylene Price, [place]. Please call [phone] to reschedule.  Heart healthy diet. Continue home O2 [place] 3 L. [place] with RN Smoking cessation .
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
## 2016-11-02 NOTE — Discharge Summary (Signed)
[facility] [place] NAME: Theresa [name]    MR#:  [id number]  DATE OF BIRTH:  1956/09/21  DATE OF ADMISSION:  10/24/2016   ADMITTING PHYSICIAN: Wilhelmina Mcardle, MD  DATE OF DISCHARGE: 11/02/2016 PRIMARY CARE PHYSICIAN: [name], PA   ADMISSION DIAGNOSIS:  Chronic obstructive pulmonary disease with acute exacerbation (HCC) [J44.1] Acute on chronic respiratory failure, unspecified whether with hypoxia or hypercapnia (HCC) [J96.20] Acute on chronic congestive heart failure, unspecified congestive heart failure type ([place]) [I50.9] DISCHARGE DIAGNOSIS:  Active Problems:   Acute respiratory failure (HCC) Acute on chronic hypoxic/hypercarbic respiratory failure due to COPD exacerbation SECONDARY DIAGNOSIS:   Past Medical History:  Diagnosis Date  . Anxiety   . Carpal tunnel syndrome   . CHF (congestive heart failure) ([place])   . Chronic generalized abdominal pain   . Chronic rhinitis   . COPD (chronic obstructive pulmonary disease) ([place])   . DDD (degenerative disc disease), cervical   . Depression   . Edema   . Gastritis   . Hematuria   . Hypertension   . Low back pain   . Lumbar radiculitis   . Malodorous urine   . Muscle weakness   . Obesity   . Renal cyst   . Sensory urge incontinence   . Tobacco abuse    HOSPITAL COURSE:   61 year old female with tobacco dependence and COPD on chronic 2-1/2-3 L of oxygen at home who presented with acute on chronic hypoxic/hypercarbic respiratory failure due to COPD exacerbation.  1. Acute on chronic hypoxic/hypercarbic respiratory failure due to COPD exacerbation - Patient is off BiPAP and on baseline oxygen - weaned off IV steroids to Oral Pred.  Cont. Pulmicort nebs, duonebs and much improved since admission and stable for discharge but has appealed her discharge with medicare.  Continue doxycycline.  2. Tobacco dependence - cont. Nicotine patch.   3. Depression - cont.  Zoloft  4. Chronic diastolic heart failure without exacerbation: Continue Lasix. No evidence of CHF.   5. Obesity: Encouraged weight loss as tolerated  6. Chronic Pain - cont. Vicodin.   7. Right flank/lower back pain - Musculoskeletal in nature. Patient had a CT renal stone study which was negative. She has a small nonobstructive renal stone on the right without hydronephrosis. discontinued her IV morphine. Continue oral Vicodin. On Flomax.  Patient has appealed the discharge and lost appeal from Medicare. She will be discharged home with home health with RN.  DISCHARGE CONDITIONS:  Stable, discharge to home with home health today. CONSULTS OBTAINED:   DRUG ALLERGIES:   Allergies  Allergen Reactions  . Codeine Nausea And Vomiting  . Meloxicam Other (See Comments)    Stomach pain   DISCHARGE MEDICATIONS:   Allergies as of 11/02/2016      Reactions   Codeine Nausea And Vomiting   Meloxicam Other (See Comments)   Stomach pain      Medication List    STOP taking these medications   doxycycline 100 MG tablet Commonly known as:  VIBRA-TABS Replaced by:  doxycycline 100 MG capsule     TAKE these medications   ADVAIR DISKUS 250-50 MCG/DOSE Aepb Generic drug:  Fluticasone-Salmeterol INHALE 1 PUFF INTO LUNGS TWICE A DAY AS NEEDED   albuterol 108 (90 Base) MCG/ACT inhaler Commonly known as:  PROVENTIL HFA;VENTOLIN HFA Inhale 2 puffs into the lungs every 6 (six) hours as needed for wheezing. Reported on 11/03/2015   ALPRAZolam 0.5 MG tablet Commonly known  as:  XANAX Take 0.5 mg by mouth 2 (two) times daily as needed for anxiety.   BC FAST PAIN RELIEF ARTHRITIS 1000-65 MG Pack Generic drug:  Aspirin-Caffeine Take 1 packet by mouth every 6 (six) hours as needed (Pain).   bisoprolol-hydrochlorothiazide 10-6.25 MG tablet Commonly known as:  ZIAC TAKE 1 TABLET BY MOUTH DAILY   [employer] D3 2000 units Caps Generic drug:  Cholecalciferol Take 1 capsule by mouth daily.    doxycycline 100 MG capsule Commonly known as:  VIBRAMYCIN Take 1 capsule (100 mg total) by mouth 2 (two) times daily. Replaces:  doxycycline 100 MG tablet   furosemide 40 MG tablet Commonly known as:  LASIX Take 1 tablet (40 mg total) by mouth daily.   HYDROcodone-acetaminophen 5-325 MG tablet Commonly known as:  NORCO/VICODIN Take 1 tablet by mouth every 8 (eight) hours as needed for severe pain.   HYDROcodone-acetaminophen 5-325 MG tablet Commonly known as:  NORCO/VICODIN Take 1 tablet by mouth every 8 (eight) hours as needed for severe pain.   HYDROcodone-acetaminophen 5-325 MG tablet Commonly known as:  NORCO/VICODIN Take 1 tablet by mouth every 8 (eight) hours as needed for severe pain.   ipratropium-albuterol 0.5-2.5 (3) MG/3ML Soln Commonly known as:  DUONEB Take 3 mLs by nebulization every 6 (six) hours as needed (shortness of breath).   OXYGEN Inhale 2.5 L into the lungs continuous. Reported on 11/03/2015   potassium chloride SA 20 MEQ tablet Commonly known as:  K-DUR,KLOR-CON Take 1 tablet (20 mEq total) by mouth daily.   predniSONE 10 MG tablet Commonly known as:  DELTASONE Label  & dispense according to the schedule below. 5 Pills PO for 1 day then, 4 Pills PO for 1 day, 3 Pills PO for 1 day, 2 Pills PO for 1 day, 1 Pill PO for 1 days then STOP. What changed:  how much to take  how to take this  when to take this  additional instructions   roflumilast 500 MCG Tabs tablet Commonly known as:  DALIRESP Take 500 mcg by mouth daily.   sertraline 100 MG tablet Commonly known as:  ZOLOFT TAKE 1 TABLET (100 MG TOTAL) BY MOUTH DAILY.   tamsulosin 0.4 MG Caps capsule Commonly known as:  FLOMAX Take 1 capsule (0.4 mg total) by mouth daily. Start taking on:  11/03/2016        DISCHARGE INSTRUCTIONS:  See AVS.  If you experience worsening of your admission symptoms, develop shortness of breath, life threatening emergency, suicidal or homicidal thoughts  you must seek medical attention immediately by calling 911 or calling your MD immediately  if symptoms less severe.  You Must read complete instructions/literature along with all the possible adverse reactions/side effects for all the Medicines you take and that have been prescribed to you. Take any new Medicines after you have completely understood and accpet all the possible adverse reactions/side effects.   Please note  You were cared for by a hospitalist during your hospital stay. If you have any questions about your discharge medications or the care you received while you were in the hospital after you are discharged, you can call the unit and asked to speak with the hospitalist on call if the hospitalist that took care of you is not available. Once you are discharged, your primary care physician will handle any further medical issues. Please note that NO REFILLS for any discharge medications will be authorized once you are discharged, as it is imperative that you return to your primary  care physician (or establish a relationship with a primary care physician if you do not have one) for your aftercare needs so that they can reassess your need for medications and monitor your lab values.    On the day of Discharge:  VITAL SIGNS:  Blood pressure (!) 97/57, pulse 88, temperature 98.3 F (36.8 C), temperature source Oral, resp. rate 12, height _0  (1.6 m), weight 273 lb 14.4 oz (124.2 kg), SpO2 95 %. PHYSICAL EXAMINATION:  GENERAL:  61 y.o.-year-old patient lying in the bed with no acute distress.  EYES: Pupils equal, round, reactive to light and accommodation. No scleral icterus. Extraocular muscles intact.  HEENT: Head atraumatic, normocephalic. Oropharynx and nasopharynx clear.  NECK:  Supple, no jugular venous distention. No thyroid enlargement, no tenderness.  LUNGS: Normal breath sounds bilaterally, no wheezing, rales,rhonchi or crepitation. No use of accessory muscles of respiration.    CARDIOVASCULAR: S1, S2 normal. No murmurs, rubs, or gallops.  ABDOMEN: Soft, non-tender, non-distended. Bowel sounds present. No organomegaly or mass.  EXTREMITIES: No pedal edema, cyanosis, or clubbing.  NEUROLOGIC: Cranial nerves II through XII are intact. Muscle strength 5/5 in all extremities. Sensation intact. Gait not checked.  PSYCHIATRIC: The patient is alert and oriented x 3.  SKIN: No obvious rash, lesion, or ulcer.  DATA REVIEW:   CBC  Recent Labs Lab 10/31/16 0647  WBC 11.1*  HGB 13.8  HCT 41.5  PLT 177    Chemistries   Recent Labs Lab 10/27/16 1532 10/28/16 0531  11/01/16 0508  NA 140 142  --   --   K 4.3 4.3  --   --   CL 96* 100*  --   --   CO2 38* 37*  --   --   GLUCOSE 117* 129*  --   --   BUN 26* 24*  --   --   CREATININE 0.74 0.56  < > 0.74  CALCIUM 9.3 8.9  --   --   MG 2.3  --   --   --   < > = values in this interval not displayed.   Microbiology Results  Results for orders placed or performed during the hospital encounter of 10/24/16  Blood culture (routine x 2)     Status: None   Collection Time: 10/24/16 11:44 PM  Result Value Ref Range Status   Specimen Description BLOOD RIGHT ANTECUBITAL  Final   Special Requests   Final    BOTTLES DRAWN AEROBIC AND ANAEROBIC  AER 14CC ANA [place]   Culture NO GROWTH 5 DAYS  Final   Report Status 10/29/2016 FINAL  Final  Blood culture (routine x 2)     Status: None   Collection Time: 10/24/16 11:44 PM  Result Value Ref Range Status   Specimen Description BLOOD LEFT ANTECUBITAL  Final   Special Requests   Final    BOTTLES DRAWN AEROBIC AND ANAEROBIC  AER 12CC ANA [place]   Culture NO GROWTH 5 DAYS  Final   Report Status 10/29/2016 FINAL  Final  Rapid Influenza A&B Antigens ([place] only)     Status: None   Collection Time: 10/25/16  2:37 AM  Result Value Ref Range Status   Influenza A ([facility]) NEGATIVE NEGATIVE Final   Influenza B ([facility]) NEGATIVE NEGATIVE Final  MRSA PCR Screening     Status: None    Collection Time: 10/25/16  5:19 AM  Result Value Ref Range Status   MRSA by PCR NEGATIVE NEGATIVE Final    Comment:  The GeneXpert MRSA Assay (FDA approved for NASAL specimens only), is one component of a comprehensive MRSA colonization surveillance program. It is not intended to diagnose MRSA infection nor to guide or monitor treatment for MRSA infections.     RADIOLOGY:  No results found.   Management plans discussed with the patient, family and they are in agreement.  CODE STATUS:     Code Status Orders        Start     Ordered   10/25/16 0345  Full code  Continuous     10/25/16 0346    Code Status History    Date Active Date Inactive Code Status Order ID Comments User Context   11/03/2015  2:45 AM 11/04/2015  2:28 PM Full Code [id number]  Harrie Foreman, MD Inpatient   06/29/2015  3:12 AM 07/04/2015  3:55 PM Full Code [id number]  Lytle Butte, MD ED   04/18/2015  5:59 AM 04/21/2015  9:46 PM Full Code [id number]  Harrie Foreman, MD Inpatient      TOTAL TIME TAKING CARE OF THIS PATIENT: 37 minutes.    Demetrios Loll M.D on 11/02/2016 at 6:18 PM  Between 7am to 6pm - Pager - [phone]  After 6pm go to [email or web address] - Proofreader  [facility]  Office  [phone]  CC: Primary care physician; [name], PA   Note: This dictation was prepared with Dragon dictation along with smaller phrase technology. Any transcriptional errors that result from this process are unintentional.

## 2016-11-02 NOTE — Progress Notes (Signed)
PT Attempt Note  Patient Details Name: Theresa [name] MRN: [id number] DOB: 1956-08-14   Cancelled Treatment:    Reason Eval/Treat Not Completed: Patient declined, no reason specified. Chart reviewed. Attempted to see patient however she currently refuses. Pt states that she feels safe with ambulation and wants to discharge. Per pt RN is working on discharge currently. Will attempt to see patient on later date/time if she remains in the hospital.  Phillips Grout PT, DPT   Huprich,Jason 11/02/2016, 10:42 AM

## 2016-11-03 LAB — CBC
HCT: 40.6 % (ref 35.0–47.0)
Hemoglobin: 13.7 g/dL (ref 12.0–16.0)
MCH: 33.1 pg (ref 26.0–34.0)
MCHC: 33.9 g/dL (ref 32.0–36.0)
MCV: 97.8 fL (ref 80.0–100.0)
Platelets: 133 10*3/uL — ABNORMAL LOW (ref 150–440)
RBC: 4.15 MIL/uL (ref 3.80–5.20)
RDW: 14.8 % — ABNORMAL HIGH (ref 11.5–14.5)
WBC: 8.6 10*3/uL (ref 3.6–11.0)

## 2016-11-03 LAB — GLUCOSE, CAPILLARY
Glucose-Capillary: 82 mg/dL (ref 65–99)
Glucose-Capillary: 124 mg/dL — ABNORMAL HIGH (ref 65–99)

## 2016-11-03 NOTE — Care Management (Signed)
Patient to discharge home today and is in agreement with the discharge.  It was delayed due to transportation issues after she lost her medicare appeal.  She will be staying at the home of a friend this afternoon before gong to the hotel.  At present, she does not know the name of the hotel or room number.  Provided patient with CM phone number to call when this information is available.  Updated [place]onis [place] with Alvis Lemmings.  Order is present for the home health nurse and patient remains in agreement with the service.
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
## 2016-11-03 NOTE — Care Management (Signed)
Patient contacted Cm with contact information for [facility].  [place] [address].  [place]  Cell [phone].  Notified [employer]

## 2016-11-03 NOTE — Care Management Important Message (Signed)
Important Message  Patient Details  Name: Theresa [name] MRN: [id number] Date of Birth: 02-20-1956   Medicare Important Message Given:  Yes    Katrina Stack, RN 11/03/2016, 8:44 AM

## 2016-11-07 ENCOUNTER — Telehealth: Payer: Self-pay | Admitting: Family Medicine

## 2016-11-07 NOTE — Telephone Encounter (Signed)
Verbal okay given to [place] with Alvis Lemmings

## 2016-11-07 NOTE — Telephone Encounter (Signed)
Proceed with home health therapy as recommended by hospitalist at discharge. Diagnoses of acute on chronic respiratory failure and chronic CHF.

## 2016-11-07 NOTE — Telephone Encounter (Signed)
Skeet Simmer with [facility] needs verbal order for PT.    First week 1 visit. 2 more weeks 2 visits per week and 1 more week with 1 visit....  Total 6 visits  His contact [phone].    Thanks, [employer]

## 2016-11-08 ENCOUNTER — Ambulatory Visit: Payer: Self-pay | Admitting: Family

## 2016-11-11 ENCOUNTER — Ambulatory Visit: Payer: Medicare PPO | Attending: Family | Admitting: Family

## 2016-11-11 ENCOUNTER — Encounter: Payer: Self-pay | Admitting: Family

## 2016-11-11 VITALS — BP 101/46 | HR 83 | Resp 18 | Ht 63.0 in | Wt 266.2 lb

## 2016-11-11 DIAGNOSIS — Z6841 Body Mass Index (BMI) 40.0 and over, adult: Secondary | ICD-10-CM | POA: Insufficient documentation

## 2016-11-11 DIAGNOSIS — F17210 Nicotine dependence, cigarettes, uncomplicated: Secondary | ICD-10-CM | POA: Insufficient documentation

## 2016-11-11 DIAGNOSIS — J449 Chronic obstructive pulmonary disease, unspecified: Secondary | ICD-10-CM | POA: Diagnosis not present

## 2016-11-11 DIAGNOSIS — G4733 Obstructive sleep apnea (adult) (pediatric): Secondary | ICD-10-CM

## 2016-11-11 DIAGNOSIS — I10 Essential (primary) hypertension: Secondary | ICD-10-CM

## 2016-11-11 DIAGNOSIS — M545 Low back pain: Secondary | ICD-10-CM | POA: Insufficient documentation

## 2016-11-11 DIAGNOSIS — I110 Hypertensive heart disease with heart failure: Secondary | ICD-10-CM | POA: Insufficient documentation

## 2016-11-11 DIAGNOSIS — I5032 Chronic diastolic (congestive) heart failure: Secondary | ICD-10-CM | POA: Diagnosis present

## 2016-11-11 DIAGNOSIS — F329 Major depressive disorder, single episode, unspecified: Secondary | ICD-10-CM | POA: Diagnosis not present

## 2016-11-11 DIAGNOSIS — G8929 Other chronic pain: Secondary | ICD-10-CM | POA: Diagnosis not present

## 2016-11-11 DIAGNOSIS — E669 Obesity, unspecified: Secondary | ICD-10-CM | POA: Diagnosis not present

## 2016-11-11 DIAGNOSIS — Z79899 Other long term (current) drug therapy: Secondary | ICD-10-CM | POA: Insufficient documentation

## 2016-11-11 DIAGNOSIS — Z888 Allergy status to other drugs, medicaments and biological substances status: Secondary | ICD-10-CM | POA: Insufficient documentation

## 2016-11-11 DIAGNOSIS — Z8249 Family history of ischemic heart disease and other diseases of the circulatory system: Secondary | ICD-10-CM | POA: Insufficient documentation

## 2016-11-11 DIAGNOSIS — Z885 Allergy status to narcotic agent status: Secondary | ICD-10-CM | POA: Diagnosis not present

## 2016-11-11 DIAGNOSIS — Z823 Family history of stroke: Secondary | ICD-10-CM | POA: Diagnosis not present

## 2016-11-11 DIAGNOSIS — F419 Anxiety disorder, unspecified: Secondary | ICD-10-CM | POA: Insufficient documentation

## 2016-11-11 DIAGNOSIS — Z720 Tobacco use: Secondary | ICD-10-CM

## 2016-11-11 NOTE — Patient Instructions (Signed)
Continue weighing daily and call for an overnight weight gain of > 2 pounds or a weekly weight gain of >5 pounds. 

## 2016-11-11 NOTE — Progress Notes (Signed)
Patient ID: Theresa [name], female    DOB: August 01, 1956, 61 y.o.   MRN: [id number]  HPI Ms Werst is a 61 y/o female with a history of chronic low back pain, HTN, depression, COPD on long-term oxygen, anxiety, carpal tunnel syndrome, long-standing tobacco use and chronic heart failure.  Last echo was done 11/03/15 but unable to access results. Echo done on 02/19/15 showed an EF of 50% without valvular stenosis. Recent PFT's done on 01/18/16  Recently admitted on 10/24/16 due to COPD exacerbation. Initially treated with bipap and then baseline oxygen was resumed. Given IV steroids and discharged on oral prednisone. Was in the ED on 03/07/16 due to shoulder pain after a mechanical fall. Was treated as whiplash and was given mobic. Last admission was 11/02/15 due to COPD exacerbation.  She presents today for a follow-up visit with fatigue and shortness of breath upon exertion. Symptoms improve upon rest. Does wear her oxygen at 2.5 L around the clock along with bipap at bedtime. She reports a stable weight. Continues to have some edema. Is now living in a hotel since hospital discharge while her husband drives the truck long distance. Is trying to eat low sodium foods but admits that it's difficult with living at the hotel. Continues to smoke. Has chronic back pain and is followed by the pain clinic.   Past Medical History:  Diagnosis Date  . Anxiety   . Carpal tunnel syndrome   . CHF (congestive heart failure) ([place])   . Chronic generalized abdominal pain   . Chronic rhinitis   . COPD (chronic obstructive pulmonary disease) ([place])   . DDD (degenerative disc disease), cervical   . Depression   . Edema   . Gastritis   . Hematuria   . Hypertension   . Low back pain   . Lumbar radiculitis   . Malodorous urine   . Muscle weakness   . Obesity   . Renal cyst   . Sensory urge incontinence   . Tobacco abuse    Past Surgical History:  Procedure Laterality Date  . ABDOMINAL HYSTERECTOMY    . CARPAL  TUNNEL RELEASE Left   . TONSILLECTOMY    . TUBAL LIGATION     Family History  Problem Relation Age of Onset  . Heart disease Mother   . Stroke Mother   . Coronary artery disease Mother   . Lung cancer Sister   . Alcohol abuse Father   . Heart disease Father   . Prostate cancer Neg Hx    Social History  Substance Use Topics  . Smoking status: Current Every Day Smoker    Packs/day: 1.00    Years: 40.00    Types: Cigarettes  . Smokeless tobacco: Never Used     Comment: 30+ years, pt states she has cut back to 4-5 cigarettes a day  . Alcohol use No   Allergies  Allergen Reactions  . Codeine Nausea And Vomiting  . Meloxicam Other (See Comments)    Stomach pain    Prior to Admission medications   Medication Sig Start Date End Date Taking? Authorizing Provider  albuterol (PROVENTIL HFA;VENTOLIN HFA) 108 (90 Base) MCG/ACT inhaler Inhale 2 puffs into the lungs every 6 (six) hours as needed for wheezing. Reported on 11/03/2015 11/02/16  Yes Demetrios Loll, MD  ALPRAZolam Duanne Moron) 0.5 MG tablet Take 0.5 mg by mouth 2 (two) times daily as needed for anxiety.   Yes Historical Provider, MD  Aspirin-Caffeine (BC FAST PAIN RELIEF ARTHRITIS) 1000-65 MG  PACK Take 1 packet by mouth every 6 (six) hours as needed (Pain).   Yes Historical Provider, MD  bisoprolol-hydrochlorothiazide [facility]) 10-6.25 MG tablet TAKE 1 TABLET BY MOUTH DAILY 06/02/16  Yes Mar Daring, PA-C  Cholecalciferol ([facility] D3) 2000 units CAPS Take 1 capsule by mouth daily.   Yes Historical Provider, MD  Fluticasone-Salmeterol (ADVAIR DISKUS) 250-50 MCG/DOSE AEPB INHALE 1 PUFF INTO LUNGS TWICE A DAY AS NEEDED 05/23/16  Yes Historical Provider, MD  furosemide (LASIX) 40 MG tablet Take 1 tablet (40 mg total) by mouth daily. 08/29/16  Yes [name], FNP  HYDROcodone-acetaminophen (NORCO/VICODIN) 5-325 MG tablet Take 1 tablet by mouth every 8 (eight) hours as needed for severe pain. 08/03/16 11/11/16 Yes Milinda Pointer, MD   ipratropium-albuterol (DUONEB) 0.5-2.5 (3) MG/3ML SOLN Take 3 mLs by nebulization every 6 (six) hours as needed (shortness of breath). 11/02/16  Yes Demetrios Loll, MD  OXYGEN Inhale 2.5 L into the lungs continuous. Reported on 11/03/2015   Yes Historical Provider, MD  potassium chloride SA (K-DUR,KLOR-CON) 20 MEQ tablet Take 1 tablet (20 mEq total) by mouth daily. 09/16/16  Yes [name], FNP  ranitidine (ZANTAC) 150 MG tablet Take 150 mg by mouth 2 (two) times daily.   Yes Historical Provider, MD  roflumilast (DALIRESP) 500 MCG TABS tablet Take 500 mcg by mouth daily.   Yes Historical Provider, MD  sertraline (ZOLOFT) 100 MG tablet TAKE 1 TABLET (100 MG TOTAL) BY MOUTH DAILY. 08/29/16  Yes Birdie Sons, MD  tamsulosin (FLOMAX) 0.4 MG CAPS capsule Take 1 capsule (0.4 mg total) by mouth daily. 11/03/16  Yes Demetrios Loll, MD  HYDROcodone-acetaminophen (NORCO/VICODIN) 5-325 MG tablet Take 1 tablet by mouth every 8 (eight) hours as needed for severe pain. 09/02/16 10/02/16  Milinda Pointer, MD  HYDROcodone-acetaminophen (NORCO/VICODIN) 5-325 MG tablet Take 1 tablet by mouth every 8 (eight) hours as needed for severe pain. 10/02/16 11/01/16  Milinda Pointer, MD    Review of Systems  Constitutional: Positive for fatigue. Negative for appetite change.  HENT: Negative for congestion, postnasal drip and sore throat.   Eyes: Negative.   Respiratory: Positive for shortness of breath. Negative for cough and wheezing.   Cardiovascular: Positive for chest pain (burning with relief from zantac) and leg swelling. Negative for palpitations.  Gastrointestinal: Negative for abdominal distention, abdominal pain and nausea.  Endocrine: Negative.   Genitourinary: Negative.   Musculoskeletal: Positive for back pain. Negative for neck pain.  Skin: Negative.   Allergic/Immunologic: Negative.   Neurological: Negative for dizziness and light-headedness.  Hematological: Negative for adenopathy. Does not bruise/bleed  easily.  Psychiatric/Behavioral: Positive for dysphoric mood. Negative for sleep disturbance (wearing oxygen & bipap at night) and suicidal ideas. The patient is not nervous/anxious.    Vitals:   11/11/16 1158  BP: (!) 101/46  Pulse: 83  Resp: 18  SpO2: 94%  Weight: 266 lb 4 oz (120.8 kg)  Height: _0  (1.6 m)   Wt Readings from Last 3 Encounters:  11/11/16 266 lb 4 oz (120.8 kg)  11/03/16 277 lb 1.9 oz (125.7 kg)  10/04/16 265 lb 3.2 oz (120.3 kg)   Lab Results  Component Value Date   CREATININE 0.74 11/01/2016   CREATININE 0.60 10/31/2016   CREATININE 0.56 10/28/2016   Physical Exam  Constitutional: She is oriented to person, place, and time. She appears well-developed and well-nourished.  HENT:  Head: Normocephalic and atraumatic.  Eyes: Conjunctivae are normal. Pupils are equal, round, and reactive to light.  Neck: Normal range of motion. Neck supple. No JVD present.  Cardiovascular: Normal rate and regular rhythm.   Pulmonary/Chest: Effort normal. She has no wheezes. She has no rales.  Abdominal: Soft. She exhibits no distension. There is no tenderness.  Musculoskeletal: She exhibits edema (1+ pitting edema in bilateral lower legs). She exhibits no tenderness.  Neurological: She is alert and oriented to person, place, and time.  Skin: Skin is warm and dry.  Psychiatric: Her behavior is normal. Thought content normal. She exhibits a depressed mood.  Nursing note and vitals reviewed.     Assessment & Plan:  1: Chronic heart failure with preserved ejection fraction- - NYHA class III - continues to be mildly volume overloaded today - weight is up 3 pounds since last here  - continues to take 54m furosemide daily along with 289m potassium daily - monitor sodium content carefully - weight daily and call for an overnight weight gain of >2 pounds or a weekly weight gain of >5 pounds - walking more up/down the hallway at the hotel and feels like she can walk further  than she was doing - saw cardiologist (CClayborn Bigness10/16/17. Next appointment around April 2018.  2: HTN- - BP looks good today  3: Obstructive sleep apnea- - wearing bipap nightly - oxygen at 2.5L around the clock - last saw pulmonology (FRaul Delon 06/27/16 and returns to him on 11/18/16  4: Tobacco use- - is now smoking only 2 cigarettes daily because she has to go outside of the hotel to smoke - removes herself from the oxygen when smoking - complete cessation discussed for 3 minutes with her  5: Reactive depression- - pain management advised her that she needed to see psychiatry - [facility]  Patient did not bring her medications nor a list. Each medication was verbally reviewed with the patient and she was encouraged to bring the bottles to every visit to confirm accuracy of list.  Return here in 2 months or sooner for any questions/problems before then

## 2016-11-17 ENCOUNTER — Encounter: Payer: Self-pay | Admitting: Pain Medicine

## 2016-11-17 ENCOUNTER — Ambulatory Visit: Payer: Medicare PPO | Attending: Pain Medicine | Admitting: Pain Medicine

## 2016-11-17 VITALS — HR 79 | Temp 98.4°F | Resp 16 | Ht 62.5 in | Wt 259.0 lb

## 2016-11-17 DIAGNOSIS — M545 Low back pain, unspecified: Secondary | ICD-10-CM

## 2016-11-17 DIAGNOSIS — M1288 Other specific arthropathies, not elsewhere classified, other specified site: Secondary | ICD-10-CM

## 2016-11-17 DIAGNOSIS — M5126 Other intervertebral disc displacement, lumbar region: Secondary | ICD-10-CM

## 2016-11-17 DIAGNOSIS — Z90710 Acquired absence of both cervix and uterus: Secondary | ICD-10-CM | POA: Diagnosis not present

## 2016-11-17 DIAGNOSIS — Z79891 Long term (current) use of opiate analgesic: Secondary | ICD-10-CM

## 2016-11-17 DIAGNOSIS — F17210 Nicotine dependence, cigarettes, uncomplicated: Secondary | ICD-10-CM | POA: Diagnosis not present

## 2016-11-17 DIAGNOSIS — G894 Chronic pain syndrome: Secondary | ICD-10-CM | POA: Diagnosis not present

## 2016-11-17 DIAGNOSIS — M79605 Pain in left leg: Secondary | ICD-10-CM

## 2016-11-17 DIAGNOSIS — M25559 Pain in unspecified hip: Secondary | ICD-10-CM

## 2016-11-17 DIAGNOSIS — M48062 Spinal stenosis, lumbar region with neurogenic claudication: Secondary | ICD-10-CM

## 2016-11-17 DIAGNOSIS — M5416 Radiculopathy, lumbar region: Secondary | ICD-10-CM

## 2016-11-17 DIAGNOSIS — F1190 Opioid use, unspecified, uncomplicated: Secondary | ICD-10-CM

## 2016-11-17 DIAGNOSIS — G8929 Other chronic pain: Secondary | ICD-10-CM

## 2016-11-17 DIAGNOSIS — M9983 Other biomechanical lesions of lumbar region: Secondary | ICD-10-CM

## 2016-11-17 DIAGNOSIS — Z5181 Encounter for therapeutic drug level monitoring: Secondary | ICD-10-CM | POA: Insufficient documentation

## 2016-11-17 DIAGNOSIS — M25552 Pain in left hip: Secondary | ICD-10-CM

## 2016-11-17 DIAGNOSIS — Z9889 Other specified postprocedural states: Secondary | ICD-10-CM | POA: Diagnosis not present

## 2016-11-17 DIAGNOSIS — M47816 Spondylosis without myelopathy or radiculopathy, lumbar region: Secondary | ICD-10-CM

## 2016-11-17 DIAGNOSIS — Z9851 Tubal ligation status: Secondary | ICD-10-CM | POA: Insufficient documentation

## 2016-11-17 DIAGNOSIS — M25551 Pain in right hip: Secondary | ICD-10-CM

## 2016-11-17 DIAGNOSIS — M5116 Intervertebral disc disorders with radiculopathy, lumbar region: Secondary | ICD-10-CM | POA: Insufficient documentation

## 2016-11-17 DIAGNOSIS — M5417 Radiculopathy, lumbosacral region: Secondary | ICD-10-CM | POA: Insufficient documentation

## 2016-11-17 DIAGNOSIS — D689 Coagulation defect, unspecified: Secondary | ICD-10-CM

## 2016-11-17 DIAGNOSIS — M47896 Other spondylosis, lumbar region: Secondary | ICD-10-CM

## 2016-11-17 DIAGNOSIS — E66813 Obesity, class 3: Secondary | ICD-10-CM

## 2016-11-17 DIAGNOSIS — Z720 Tobacco use: Secondary | ICD-10-CM

## 2016-11-17 DIAGNOSIS — M48061 Spinal stenosis, lumbar region without neurogenic claudication: Secondary | ICD-10-CM

## 2016-11-17 MED ORDER — HYDROCODONE-ACETAMINOPHEN 5-325 MG PO TABS: 1.0000 | ORAL_TABLET | Freq: Three times a day (TID) | ORAL | 0 refills | Status: DC | PRN

## 2016-11-17 NOTE — Progress Notes (Addendum)
Patient's Name: Theresa [name]  MRN: [id number]  Referring Provider: Margo Common, [place]  DOB: [birth year]  PCP: Margo Common, PA  DOS: 11/17/2016  Note by: Kathlen Brunswick. Dossie Arbour, MD  Service setting: Ambulatory outpatient  Specialty: Interventional Pain Management  Location: [facility] (AMB) Pain Management Facility    Patient type: Established   Primary Reason(s) for Visit: Encounter for prescription drug management (Level of risk: moderate) CC: No chief complaint on file.  HPI  Theresa [name] is a 61 y.o. year old, female patient, who comes today for a medication management evaluation. She has Neuritis or radiculitis due to rupture of lumbar intervertebral disc; Decreased motor strength; Essential (primary) hypertension; Clinical depression; DDD (degenerative disc disease), lumbar; CAFL (chronic airflow limitation) ([place]); Chronic rhinitis; Carpal tunnel syndrome; Blood clotting disorder ([place]); Anxiety state; Chronic diastolic heart failure ([place]); Chest pain; Tobacco abuse; Encounter for therapeutic drug level monitoring; Encounter for chronic pain management; Pain management; Neurogenic pain; Neuropathic pain; Musculoskeletal pain; Lumbar spondylosis (L4-5); Chronic low back pain (Location of Primary Source of Pain) (Left); Chronic lower extremity pain (Location of Secondary source of pain) (Left); Chronic lumbar radicular pain (Location of Secondary source of pain) (L4 & S1 Dermatome) (Left); Chronic shoulder pain (Right side); Chronic upper extremity pain (Right-sided); Cervical radiculitis (Right side); Abnormal x-ray of lumbar spine; Chronic pain syndrome (significant psychosocial component); 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; Chronic obstructive pulmonary disease ([place]); Coagulation disorder ([place]); Chronic hip pain (Location of Primary Source of Pain) (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); 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); Obesity, Class III, BMI 40-49.9 (morbid obesity) ([place]); Pulmonary  hypertensive arterial disease; Vitamin D deficiency; Long term current use of opiate analgesic; Long term prescription opiate use; Opiate use (15 MME/Day); Lumbar facet syndrome (Location of Primary Source of Pain) (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); Chronic sacroiliac joint pain (Bilateral); Sleep apnea; Supplemental oxygen dependent; Acute respiratory failure ([place]); Chronic hip pain (Location of Primary Source of Pain) (Left); Chronic hip pain (Location of Primary Source of Pain) (Right); Lumbar facet hypertrophy (multilevel); Lumbar spinal stenosis (L4-5); 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 Her primarily concern today is the No chief complaint on file.  Pain Assessment: Self-Reported Pain Score: 5 /10             Reported level is compatible with observation.       Pain Type: Chronic pain Pain Location: Back Pain Orientation: Lower, Right, Left Pain Descriptors / Indicators: Aching, Throbbing, Dull, Discomfort, Sharp, Nagging Pain Frequency: Constant  Theresa [name] was last seen on 08/01/2016 for medication management. During today's appointment we reviewed Theresa [name]'s chronic pain status, as well as her outpatient medication regimen.  The patient  reports that she does not use drugs. Her body mass index is 46.62 kg/m.  Further details on both, my assessment(s), as well as the proposed treatment plan, please see below.  Controlled Substance Pharmacotherapy Assessment REMS (Risk Evaluation and Mitigation Strategy)  Analgesic:Hydrocodone/APAP 5/325 one tablet every 8 hours (15 mg/day of hydrocodone) MME/day:15 mg/day Theresa [name]  11/17/2016 12:43 PM  Signed Nursing Pain Medication Assessment:  Safety precautions to be maintained throughout the outpatient stay will include: orient to surroundings, keep bed in low position, maintain call bell within reach at all times, provide  assistance with transfer out of bed and ambulation.  Medication Inspection Compliance: Pill count conducted under aseptic conditions, in front of the patient. Neither the pills nor the bottle was  removed from the patient's sight at any time. Once count was completed pills were immediately returned to the patient in their original bottle. Pill Count: 9 of 90 pills remain Bottle Appearance: Standard pharmacy container. Clearly labeled. Medication: See above Filled Date: 12 / 24 / 2017   Pharmacokinetics: Liberation and absorption (onset of action): WNL Distribution (time to peak effect): WNL Metabolism and excretion (duration of action): WNL         Pharmacodynamics: Desired effects: Analgesia: Ms. Orr reports >50% benefit. Functional ability: Patient reports that medication allows her to accomplish basic ADLs Clinically meaningful improvement in function (CMIF): Sustained CMIF goals met Perceived effectiveness: Described as relatively effective, allowing for increase in activities of daily living (ADL) Undesirable effects: Side-effects or Adverse reactions: None reported Monitoring: [place] PMP: Online review of the past 14-monthperiod conducted. Compliant with practice rules and regulations List of all UDS test(s) done:  Lab Results  Component Value Date   TOXASSSELUR FINAL 03/01/2016   TOXASSSELUR FINAL 01/12/2016   TOXASSSELUR FINAL 09/16/2015   Last UDS on record: Tox[employer] Select 13  Date Value Ref Range Status  03/01/2016 FINAL  Final    Comment:    ==================================================================== TOXASSURE SELECT 13 (MW) ==================================================================== Test                             Result       Flag       Units Drug Present and Declared for Prescription Verification   Hydrocodone                    863          EXPECTED   ng/mg creat   Hydromorphone                  195          EXPECTED   ng/mg creat    Dihydrocodeine                 175          EXPECTED   ng/mg creat   Norhydrocodone                 1078         EXPECTED   ng/mg creat    Sources of hydrocodone include scheduled prescription    medications. Hydromorphone, dihydrocodeine and norhydrocodone are    expected metabolites of hydrocodone. Hydromorphone and    dihydrocodeine are also available as scheduled prescription    medications. Drug Absent but Declared for Prescription Verification   Alprazolam                     Not Detected UNEXPECTED ng/mg creat ==================================================================== Test                      Result    Flag   Units      Ref Range   Creatinine              40               mg/dL      >=20 ==================================================================== Declared Medications:  The flagging and interpretation on this report are based on the  following declared medications.  Unexpected results may arise from  inaccuracies in the declared medications.  **Note: The testing scope of this panel includes these medications:  Alprazolam (Xanax)  Hydrocodone (  Norco)  **Note: The testing scope of this panel does not include following  reported medications:  Acetaminophen (Norco)  Albuterol (Duoneb)  Albuterol (Proventil)  Amoxicillin  Aspirin  Bisoprolol (Ziac)  Caffeine  Cholecalciferol  Fluticasone (Advair)  Furosemide (Lasix)  Hydrochlorothiazide (Ziac)  Ipratropium (Duoneb)  Oxygen  Roflumilast (Daliresp)  Salmeterol (Advair)  Sertraline (Zoloft)  Tiotropium (Spiriva) ==================================================================== For clinical consultation, please call [phone]. ====================================================================    UDS interpretation: Compliant          Medication Assessment Form: Reviewed. Patient indicates being compliant with therapy Treatment compliance: Compliant Risk Assessment Profile: Aberrant behavior:  See prior evaluations. None observed or detected today Comorbid factors increasing risk of overdose: See prior notes. No additional risks detected today Risk of substance use disorder (SUD): Low Opioid Risk Tool (ORT) Total Score: 0  Interpretation Table:  Score <3 = Low Risk for SUD  Score between 4-7 = Moderate Risk for SUD  Score >8 = High Risk for Opioid Abuse   Risk Mitigation Strategies:  Patient Counseling: Covered Patient-Prescriber Agreement (PPA): Present and active  Notification to other healthcare providers: Done  Pharmacologic Plan: No change in therapy, at this time  Laboratory Chemistry  Inflammation Markers Lab Results  Component Value Date   ESRSEDRATE 5 09/17/2015   CRP 1.1 (H) 09/17/2015   Renal Function Lab Results  Component Value Date   BUN 24 (H) 10/28/2016   CREATININE 0.74 11/01/2016   GFRAA >60 11/01/2016   GFRNONAA >60 11/01/2016   Hepatic Function Lab Results  Component Value Date   AST 21 10/24/2016   ALT 22 10/24/2016   ALBUMIN 3.5 10/24/2016   Electrolytes Lab Results  Component Value Date   NA 142 10/28/2016   K 4.3 10/28/2016   CL 100 (L) 10/28/2016   CALCIUM 8.9 10/28/2016   MG 2.3 10/27/2016   Pain Modulating Vitamins Lab Results  Component Value Date   25OHVITD1 13 (L) 09/17/2015   25OHVITD2 <1.0 09/17/2015   25OHVITD3 13 09/17/2015   Coagulation Parameters Lab Results  Component Value Date   PLT 133 (L) 11/03/2016   Cardiovascular Lab Results  Component Value Date   BNP 136.0 (H) 10/24/2016   HGB 13.7 11/03/2016   HCT 40.6 11/03/2016   Note: Lab results reviewed.  Recent Diagnostic Imaging Review  Dg Chest 2 View  Result Date: 10/24/2016 CLINICAL DATA:  Acute onset of shortness of breath and nonproductive cough. Decreased O2 saturation. Initial encounter. EXAM: CHEST  2 VIEW COMPARISON:  Chest radiograph performed 11/02/2015 FINDINGS: The lungs are well-aerated. Vascular congestion is noted. Increased  interstitial markings may reflect mild interstitial edema. There is no evidence of pleural effusion or pneumothorax. The heart is borderline enlarged. No acute osseous abnormalities are seen. IMPRESSION: Vascular congestion and borderline cardiomegaly. Increased interstitial markings may reflect mild interstitial edema. Electronically Signed   By: Garald Balding M.D.   On: 10/24/2016 20:53   Ct Angio Chest Pe W And/or Wo Contrast  Result Date: 10/24/2016 CLINICAL DATA:  Shortness of breath for 1 week, nonproductive cough. Symptoms worsening tonight. History of COPD, CHF. EXAM: CT ANGIOGRAPHY CHEST WITH CONTRAST TECHNIQUE: Multidetector CT imaging of the chest was performed using the standard protocol during bolus administration of intravenous contrast. Multiplanar CT image reconstructions and MIPs were obtained to evaluate the vascular anatomy. CONTRAST:  75 cc Isovue 370 COMPARISON:  Chest radiograph October 25, 2015 2018 at 2045 hours FINDINGS: CARDIOVASCULAR: Adequate contrast opacification of the pulmonary artery's. Main pulmonary artery is enlarged at  3.5 cm. No pulmonary arterial filling defects to the level of the subsegmental branches. Heart size is mildly enlarged, no right heart strain. Moderate coronary artery calcifications. No pericardial effusions. Thoracic aorta is normal course and caliber, mild calcific atherosclerosis of the aortic arch. MEDIASTINUM/NODES: No lymphadenopathy by CT size criteria. Greater than expected number of subcentimeter mediastinal lymph nodes, likely reactive. A few small hilar lymph nodes. LUNGS/PLEURA: Tracheobronchial tree is patent, no pneumothorax. Interlobular septal thickening. No pleural effusion or focal consolidation. 3 mm RIGHT lower lobe sub solid pulmonary nodule, series 8, image 66/94 ; no indicated follow-up by size criteria. Bilateral upper lobe atelectasis. UPPER ABDOMEN: Included view of the abdomen is nonacute. 14 mm LEFT adrenal nodule. Numerous  subcentimeter hypodense and liver most compatible with cysts. MUSCULOSKELETAL: Visualized soft tissues and included osseous structures are nonacute. Mild degenerative change of the thoracic spine. Review of the MIP images confirms the above findings. IMPRESSION: 1. No acute pulmonary embolism. Enlarged main pulmonary artery associated with pulmonary arterial hypertension. 2. Mild cardiomegaly. Interlobular septal thickening in mild bronchial wall thickening most compatible with pulmonary edema, there could be a chronic component underlying reactive airway disease/bronchitis. No focal consolidation. 3. Greater than expected number of non pathologically enlarged mediastinal lymph nodes may be reactive. 4. **An incidental finding of potential clinical significance has been found. 14 mm LEFT adrenal nodule. Without history of cancer, this is probably benign. Consider 12 month follow-up adrenal CT. This recommendation follows ACR consensus guidelines: Management of Incidental Adrenal Masses: A White Paper of the ACR Incidental Findings Committee. J Am Coll Radiol 2017;14:1038-1044.** Electronically Signed   By: Elon Alas M.D.   On: 10/24/2016 21:45   Note: Imaging results reviewed.          Meds  The patient has a current medication list which includes the following prescription(s): albuterol, alprazolam, aspirin-caffeine, bisoprolol-hydrochlorothiazide, cholecalciferol, fluticasone-salmeterol, furosemide, hydrocodone-acetaminophen, hydrocodone-acetaminophen, hydrocodone-acetaminophen, ipratropium-albuterol, oxygen-helium, potassium chloride sa, ranitidine, roflumilast, sertraline, and tamsulosin.  Current Outpatient Prescriptions on File Prior to Visit  Medication Sig  . albuterol (PROVENTIL HFA;VENTOLIN HFA) 108 (90 Base) MCG/ACT inhaler Inhale 2 puffs into the lungs every 6 (six) hours as needed for wheezing. Reported on 11/03/2015  . ALPRAZolam (XANAX) 0.5 MG tablet Take 0.5 mg by mouth 2 (two) times  daily as needed for anxiety.  . Aspirin-Caffeine (BC FAST PAIN RELIEF ARTHRITIS) 1000-65 MG PACK Take 1 packet by mouth every 6 (six) hours as needed (Pain).  . bisoprolol-hydrochlorothiazide (ZIAC) 10-6.25 MG tablet TAKE 1 TABLET BY MOUTH DAILY  . Cholecalciferol ([employer] D3) 2000 units CAPS Take 1 capsule by mouth daily.  . Fluticasone-Salmeterol (ADVAIR DISKUS) 250-50 MCG/DOSE AEPB INHALE 1 PUFF INTO LUNGS TWICE A DAY AS NEEDED  . furosemide (LASIX) 40 MG tablet Take 1 tablet (40 mg total) by mouth daily.  Marland Kitchen ipratropium-albuterol (DUONEB) 0.5-2.5 (3) MG/3ML SOLN Take 3 mLs by nebulization every 6 (six) hours as needed (shortness of breath).  . OXYGEN Inhale 2.5 L into the lungs continuous. Reported on 11/03/2015  . potassium chloride SA (K-DUR,KLOR-CON) 20 MEQ tablet Take 1 tablet (20 mEq total) by mouth daily.  . ranitidine (ZANTAC) 150 MG tablet Take 150 mg by mouth 2 (two) times daily.  . roflumilast (DALIRESP) 500 MCG TABS tablet Take 500 mcg by mouth daily.  . sertraline (ZOLOFT) 100 MG tablet TAKE 1 TABLET (100 MG TOTAL) BY MOUTH DAILY.  . tamsulosin (FLOMAX) 0.4 MG CAPS capsule Take 1 capsule (0.4 mg total) by mouth daily.   No current  facility-administered medications on file prior to visit.    ROS  Constitutional: Denies any fever or chills Gastrointestinal: No reported hemesis, hematochezia, vomiting, or acute GI distress Musculoskeletal: Denies any acute onset joint swelling, redness, loss of ROM, or weakness Neurological: No reported episodes of acute onset apraxia, aphasia, dysarthria, agnosia, amnesia, paralysis, loss of coordination, or loss of consciousness  Allergies  Ms. Sowder is allergic to codeine and meloxicam.  PFSH  Drug: Ms. Bally  reports that she does not use drugs. Alcohol:  reports that she does not drink alcohol. Tobacco:  reports that she has been smoking Cigarettes.  She has a 40.00 pack-year smoking history. She has never used smokeless tobacco. Medical:   has a past medical history of Anxiety; Carpal tunnel syndrome; CHF (congestive heart failure) ([place]); CHF (congestive heart failure) ([place]); Chronic generalized abdominal pain; Chronic rhinitis; COPD (chronic obstructive pulmonary disease) (H[facility]); DDD (degenerative disc disease), cervical; Depression; Edema; Flu; Gastritis; Hematuria; Hypertension; Kidney stones; Low back pain; Lumbar radiculitis; Malodorous urine; Muscle weakness; Obesity; Renal cyst; Sensory urge incontinence; and Tobacco abuse. Family: family history includes Alcohol abuse in her father; Coronary artery disease in her mother; Heart disease in her father and mother; Lung cancer in her sister; Stroke in her mother.  Past Surgical History:  Procedure Laterality Date  . ABDOMINAL HYSTERECTOMY    . CARPAL TUNNEL RELEASE Left   . TONSILLECTOMY    . TUBAL LIGATION     Constitutional Exam  General appearance: Well nourished, well developed, and well hydrated. In no apparent acute distress Vitals:   11/17/16 1059  Pulse: 79  Resp: 16  Temp: 98.4 F (36.9 C)  TempSrc: Oral  SpO2: 94%  Weight: 259 lb (117.5 kg)  Height: 5' 2.5" (1.588 m)   BMI Assessment: Estimated body mass index is 46.62 kg/m as calculated from the following:   Height as of this encounter: 5' 2.5" (1.588 m).   Weight as of this encounter: 259 lb (117.5 kg).  BMI interpretation table: BMI level Category Range association with higher incidence of chronic pain  <18 kg/m2 Underweight   18.5-24.9 kg/m2 Ideal body weight   25-29.9 kg/m2 Overweight Increased incidence by 20%  30-34.9 kg/m2 Obese (Class I) Increased incidence by 68%  35-39.9 kg/m2 Severe obesity (Class II) Increased incidence by 136%  >40 kg/m2 Extreme obesity (Class III) Increased incidence by 254%   BMI Readings from Last 4 Encounters:  11/17/16 46.62 kg/m  11/11/16 47.16 kg/m  11/03/16 49.09 kg/m  10/04/16 46.98 kg/m   Wt Readings from Last 4 Encounters:  11/17/16 259 lb (117.5 kg)   11/11/16 266 lb 4 oz (120.8 kg)  11/03/16 277 lb 1.9 oz (125.7 kg)  10/04/16 265 lb 3.2 oz (120.3 kg)  Psych/Mental status: Alert, oriented x 3 (person, place, & time)       Eyes: PERLA Respiratory: No evidence of acute respiratory distress  Cervical Spine Exam  Inspection: No masses, redness, or swelling Alignment: Symmetrical Functional ROM: Unrestricted ROM Stability: No instability detected Muscle strength & Tone: Functionally intact Sensory: Unimpaired Palpation: Non-contributory  Upper Extremity (UE) Exam    Side: Right upper extremity  Side: Left upper extremity  Inspection: No masses, redness, swelling, or asymmetry  Inspection: No masses, redness, swelling, or asymmetry  Functional ROM: Unrestricted ROM          Functional ROM: Unrestricted ROM          Muscle strength & Tone: Functionally intact  Muscle strength & Tone: Functionally intact  Sensory: Unimpaired  Sensory: Unimpaired  Palpation: Non-contributory  Palpation: Non-contributory   Thoracic Spine Exam  Inspection: No masses, redness, or swelling Alignment: Symmetrical Functional ROM: Unrestricted ROM Stability: No instability detected Sensory: Unimpaired Muscle strength & Tone: Functionally intact Palpation: Non-contributory  Lumbar Spine Exam  Inspection: No masses, redness, or swelling Alignment: Symmetrical Functional ROM: Decreased ROM Stability: No instability detected Muscle strength & Tone: Functionally intact Sensory: Movement-associated pain Palpation: Complains of area being tender to palpation Provocative Tests: Lumbar Hyperextension and rotation test: Positive bilaterally for facet joint pain. Patrick's Maneuver: evaluation deferred today              Gait & Posture Assessment  Ambulation: Patient ambulates using a wheel chair Gait: Limited. Using assistive device to ambulate Posture: Antalgic   Lower Extremity Exam    Side: Right lower extremity  Side: Left lower extremity   Inspection: No masses, redness, swelling, or asymmetry  Inspection: No masses, redness, swelling, or asymmetry  Functional ROM: Unrestricted ROM          Functional ROM: Unrestricted ROM          Muscle strength & Tone: Functionally intact  Muscle strength & Tone: Give-away weakness  Sensory: Unimpaired  Sensory: Dermatomal pain pattern  Palpation: Non-contributory  Palpation: Non-contributory   Assessment  Primary Diagnosis & Pertinent Problem List: The primary encounter diagnosis was Chronic pain syndrome. Diagnoses of Chronic hip pain, unspecified laterality, Chronic hip pain (Location of Primary Source of Pain) (Left), Chronic hip pain (Location of Primary Source of Pain) (Right), Chronic low back pain (Location of Primary Source of Pain) (Left), Chronic lower extremity pain (Location of Secondary source of pain) (Left), Chronic lumbar radicular pain (Location of Secondary source of pain) (S1 Dermatome) (Left), Lumbar facet syndrome (Location of Primary Source of Pain) (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), Long term current use of opiate analgesic, Opiate use (15 MME/Day), Obesity, Class III, BMI 40-49.9 (morbid obesity) (H[facility]), Blood clotting disorder ([facility]), Tobacco abuse, Lumbar facet hypertrophy (multilevel), Spinal stenosis of lumbar region with neurogenic claudication, 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  Status Diagnosis  Controlled Controlled Controlled 1. Chronic pain syndrome   2. Chronic hip pain, unspecified laterality   3. Chronic hip pain (Location of Primary Source of Pain) (Left)   4. Chronic hip pain (Location of Primary Source of Pain) (Right)   5. Chronic low back pain (Location of Primary Source of Pain) (Left)   6. Chronic lower extremity pain (Location of Secondary source of pain) (Left)   7. Chronic lumbar radicular pain (Location of Secondary source  of pain) (S1 Dermatome) (Left)   8. Lumbar facet syndrome (Location of Primary Source of Pain) (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)   9. Long term current use of opiate analgesic   10. Opiate use (15 MME/Day)   11. Obesity, Class III, BMI 40-49.9 (morbid obesity) ([place])   12. Blood clotting disorder ([place])   13. Tobacco abuse   14. Lumbar facet hypertrophy (multilevel)   15. Spinal stenosis of lumbar region with neurogenic claudication   16. 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)   17. 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 Lumbosacral radiculopathy at L4      Plan of Care  Pharmacotherapy (Medications Ordered): Meds ordered this encounter  Medications  . HYDROcodone-acetaminophen (NORCO/VICODIN) 5-325 MG tablet    Sig: Take 1 tablet by mouth every 8 (eight) hours as  needed for severe pain.    Dispense:  90 tablet    Refill:  0    Do not add this medication to the electronic "Automatic Refill" notification system. Patient may have prescription filled one day early if pharmacy is closed on scheduled refill date. Do not fill until: 11/17/16 To last until: 12/17/16  . HYDROcodone-acetaminophen (NORCO/VICODIN) 5-325 MG tablet    Sig: Take 1 tablet by mouth every 8 (eight) hours as needed for severe pain.    Dispense:  90 tablet    Refill:  0    Do not add this medication to the electronic "Automatic Refill" notification system. Patient may have prescription filled one day early if pharmacy is closed on scheduled refill date. Do not fill until: 12/17/16 To last until: 01/16/17  . HYDROcodone-acetaminophen (NORCO/VICODIN) 5-325 MG tablet    Sig: Take 1 tablet by mouth every 8 (eight) hours as needed for severe pain.    Dispense:  90 tablet    Refill:  0    Do not add this medication to the electronic "Automatic Refill" notification system. Patient may have prescription filled one day early if pharmacy is closed on scheduled refill date. Do not fill until: 01/16/17 To last  until: 02/15/17   New Prescriptions   No medications on file   Medications administered today: Ms. Colberg had no medications administered during this visit. Lab-work, procedure(s), and/or referral(s): No orders of the defined types were placed in this encounter.  Imaging and/or referral(s): None  Interventional therapies: Planned, scheduled, and/or pending:   Diagnostic bilateral lumbar facet block #2 under fluoro and sedation.   Considering:   Diagnostic bilateral lumbar facet block under fluoro and sedation. Possible bilateral lumbar facet RFA    Palliative PRN treatment(s):   Diagnostic left sided lumbar facet block under fluoroscopic guidance and IV sedation.   Provider-requested follow-up: Return in about 3 months (around 02/14/2017) for (MD) Med-Mgmt, in addition, procedure (ASAA).  Future Appointments Date Time Provider [place]  11/18/2016 2:00 PM Vickki Muff [place], [place] [facility] None  01/09/2017 1:00 PM [name], FNP [facility] None  02/16/2017 11:15 AM Milinda Pointer, MD [facility] None   Primary Care Physician: Margo Common, PA Location: [facility] Note by: Kathlen Brunswick. Dossie Arbour, M.D, DABA, DABAPM, DABPM, DABIPP, FIPP Date: 11/17/2016; Time: 12:48 PM  Pain Score Disclaimer: We use the NRS-11 scale. This is a self-reported, subjective measurement of pain severity with only modest accuracy. It is used primarily to identify changes within a particular patient. It must be understood that outpatient pain scales are significantly less accurate that those used for research, where they can be applied under ideal controlled circumstances with minimal exposure to variables. In reality, the score is likely to be a combination of pain intensity and pain affect, where pain affect describes the degree of emotional arousal or changes in action readiness caused by the sensory experience of pain. Factors such as social and work situation,  setting, emotional state, anxiety levels, expectation, and prior pain experience may influence pain perception and show large inter-individual differences that may also be affected by time variables.  Patient instructions provided during this appointment: Patient Instructions  You were given 3 prescriptions for Hydrocodone today.  Facet Blocks Patient Information  Description: The facets are joints in the spine between the vertebrae.  Like any joints in the body, facets can become irritated and painful.  Arthritis can also effect the facets.  By injecting steroids and local anesthetic  in and around these joints, we can temporarily block the nerve supply to them.  Steroids act directly on irritated nerves and tissues to reduce selling and inflammation which often leads to decreased pain.  Facet blocks may be done anywhere along the spine from the neck to the low back depending upon the location of your pain.   After numbing the skin with local anesthetic (like Novocaine), a small needle is passed onto the facet joints under x-ray guidance.  You may experience a sensation of pressure while this is being done.  The entire block usually lasts about 15-25 minutes.   Conditions which may be treated by facet blocks:   Low back/buttock pain  Neck/shoulder pain  Certain types of headaches  Preparation for the injection:  1. Do not eat any solid food or dairy products within 8 hours of your appointment. 2. You may drink clear liquid up to 3 hours before appointment.  Clear liquids include water, black coffee, juice or soda.  No milk or cream please. 3. You may take your regular medication, including pain medications, with a sip of water before your appointment.  Diabetics should hold regular insulin (if taken separately) and take 1/2 normal NPH dose the morning of the procedure.  Carry some sugar containing items with you to your appointment. 4. A driver must accompany you and be prepared to drive  you home after your procedure. 5. Bring all your current medications with you. 6. An IV may be inserted and sedation may be given at the discretion of the physician. 7. A blood pressure cuff, EKG and other monitors will often be applied during the procedure.  Some patients may need to have extra oxygen administered for a short period. 8. You will be asked to provide medical information, including your allergies and medications, prior to the procedure.  We must know immediately if you are taking blood thinners (like Coumadin/Warfarin) or if you are allergic to IV iodine contrast (dye).  We must know if you could possible be pregnant.  Possible side-effects:   Bleeding from needle site  Infection (rare, may require surgery)  Nerve injury (rare)  Numbness & tingling (temporary)  Difficulty urinating (rare, temporary)  Spinal headache (a headache worse with upright posture)  Light-headedness (temporary)  Pain at injection site (serveral days)  Decreased blood pressure (rare, temporary)  Weakness in arm/leg (temporary)  Pressure sensation in back/neck (temporary)   Call if you experience:   Fever/chills associated with headache or increased back/neck pain  Headache worsened by an upright position  New onset, weakness or numbness of an extremity below the injection site  Hives or difficulty breathing (go to the emergency room)  Inflammation or drainage at the injection site(s)  Severe back/neck pain greater than usual  New symptoms which are concerning to you  Please note:  Although the local anesthetic injected can often make your back or neck feel good for several hours after the injection, the pain will likely return. It takes 3-7 days for steroids to work.  You may not notice any pain relief for at least one week.  If effective, we will often do a series of 2-3 injections spaced 3-6 weeks apart to maximally decrease your pain.  After the initial series, you may be a  candidate for a more permanent nerve block of the facets.  If you have any questions, please call #[phone]) [facility]

## 2016-11-17 NOTE — Progress Notes (Signed)
Nursing Pain Medication Assessment:  Safety precautions to be maintained throughout the outpatient stay will include: orient to surroundings, keep bed in low position, maintain call bell within reach at all times, provide assistance with transfer out of bed and ambulation.  Medication Inspection Compliance: Pill count conducted under aseptic conditions, in front of the patient. Neither the pills nor the bottle was removed from the patient's sight at any time. Once count was completed pills were immediately returned to the patient in their original bottle. Pill Count: 9 of 90 pills remain Bottle Appearance: Standard pharmacy container. Clearly labeled. Medication: See above Filled Date: 12 / 24 / 2017

## 2016-11-17 NOTE — Patient Instructions (Signed)
You were given 3 prescriptions for Hydrocodone today.  Facet Blocks Patient Information  Description: The facets are joints in the spine between the vertebrae.  Like any joints in the body, facets can become irritated and painful.  Arthritis can also effect the facets.  By injecting steroids and local anesthetic in and around these joints, we can temporarily block the nerve supply to them.  Steroids act directly on irritated nerves and tissues to reduce selling and inflammation which often leads to decreased pain.  Facet blocks may be done anywhere along the spine from the neck to the low back depending upon the location of your pain.   After numbing the skin with local anesthetic (like Novocaine), a small needle is passed onto the facet joints under x-ray guidance.  You may experience a sensation of pressure while this is being done.  The entire block usually lasts about 15-25 minutes.   Conditions which may be treated by facet blocks:   Low back/buttock pain  Neck/shoulder pain  Certain types of headaches  Preparation for the injection:  1. Do not eat any solid food or dairy products within 8 hours of your appointment. 2. You may drink clear liquid up to 3 hours before appointment.  Clear liquids include water, black coffee, juice or soda.  No milk or cream please. 3. You may take your regular medication, including pain medications, with a sip of water before your appointment.  Diabetics should hold regular insulin (if taken separately) and take 1/2 normal NPH dose the morning of the procedure.  Carry some sugar containing items with you to your appointment. 4. A driver must accompany you and be prepared to drive you home after your procedure. 5. Bring all your current medications with you. 6. An IV may be inserted and sedation may be given at the discretion of the physician. 7. A blood pressure cuff, EKG and other monitors will often be applied during the procedure.  Some patients may  need to have extra oxygen administered for a short period. 8. You will be asked to provide medical information, including your allergies and medications, prior to the procedure.  We must know immediately if you are taking blood thinners (like Coumadin/Warfarin) or if you are allergic to IV iodine contrast (dye).  We must know if you could possible be pregnant.  Possible side-effects:   Bleeding from needle site  Infection (rare, may require surgery)  Nerve injury (rare)  Numbness & tingling (temporary)  Difficulty urinating (rare, temporary)  Spinal headache (a headache worse with upright posture)  Light-headedness (temporary)  Pain at injection site (serveral days)  Decreased blood pressure (rare, temporary)  Weakness in arm/leg (temporary)  Pressure sensation in back/neck (temporary)   Call if you experience:   Fever/chills associated with headache or increased back/neck pain  Headache worsened by an upright position  New onset, weakness or numbness of an extremity below the injection site  Hives or difficulty breathing (go to the emergency room)  Inflammation or drainage at the injection site(s)  Severe back/neck pain greater than usual  New symptoms which are concerning to you  Please note:  Although the local anesthetic injected can often make your back or neck feel good for several hours after the injection, the pain will likely return. It takes 3-7 days for steroids to work.  You may not notice any pain relief for at least one week.  If effective, we will often do a series of 2-3 injections spaced 3-6 weeks apart  to maximally decrease your pain.  After the initial series, you may be a candidate for a more permanent nerve block of the facets.  If you have any questions, please call #[phone]) [facility]

## 2016-11-18 ENCOUNTER — Ambulatory Visit (INDEPENDENT_AMBULATORY_CARE_PROVIDER_SITE_OTHER): Payer: Medicare PPO | Admitting: Family Medicine

## 2016-11-18 ENCOUNTER — Encounter: Payer: Self-pay | Admitting: Family Medicine

## 2016-11-18 VITALS — BP 100/68 | HR 87 | Temp 98.2°F | Resp 20 | Wt 260.4 lb

## 2016-11-18 DIAGNOSIS — J9600 Acute respiratory failure, unspecified whether with hypoxia or hypercapnia: Secondary | ICD-10-CM

## 2016-11-18 DIAGNOSIS — N200 Calculus of kidney: Secondary | ICD-10-CM | POA: Diagnosis not present

## 2016-11-18 DIAGNOSIS — J449 Chronic obstructive pulmonary disease, unspecified: Secondary | ICD-10-CM

## 2016-11-18 DIAGNOSIS — Z79891 Long term (current) use of opiate analgesic: Secondary | ICD-10-CM | POA: Diagnosis not present

## 2016-11-18 DIAGNOSIS — I5032 Chronic diastolic (congestive) heart failure: Secondary | ICD-10-CM | POA: Diagnosis not present

## 2016-11-18 NOTE — Progress Notes (Signed)
Patient: Theresa [name] Female    DOB: 1956/09/16   61 y.o.   MRN: [id number] Visit Date: 11/18/2016  Today's Provider: [name], PA   Chief Complaint  Patient presents with  . Hospitalization Follow-up   Subjective:    HPI  Follow up Hospitalization  Patient was admitted to [facility] on 10/25/2016 and discharged on 11/03/2016. She was treated for acute respiratory failure, COPD,CHF. At day of discharge, was found to have some blood in urine and CT scan showed renal stones on the right. Treatment for this included home health therapy, started Doxycycline, Prednisone, and Tamsulosin. She reports good compliance with treatment. She reports this condition is Improved.  ------------------------------------------------------------------------------------   Patient Active Problem List   Diagnosis Date Noted  . Lumbar facet hypertrophy (multilevel) 11/17/2016  . Lumbar spinal stenosis (L4-5) 11/17/2016  . 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) 11/17/2016  . 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 Lumbosacral radiculopathy at L4 11/17/2016  . Chronic hip pain (Location of Primary Source of Pain) (Left) 11/01/2016  . Chronic hip pain (Location of Primary Source of Pain) (Right) 11/01/2016  . Acute respiratory failure ([place]) 10/25/2016  . Supplemental oxygen dependent 08/01/2016  . Sleep apnea 06/02/2016  . Long term current use of opiate analgesic 01/12/2016  . Long term prescription opiate use 01/12/2016  . Opiate use (15 MME/Day) 01/12/2016  . Lumbar facet syndrome (Location of Primary Source of Pain) (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) 01/12/2016  . Chronic sacroiliac joint pain (Bilateral) 01/12/2016  . Vitamin D deficiency 12/30/2015  . Chronic hip pain (Location of Primary Source of Pain) (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) 12/28/2015  . 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 Obesity, Class III, BMI 40-49.9 (morbid obesity) ([place]) 12/28/2015  . Pulmonary  hypertensive arterial disease 11/19/2015  . Coagulation disorder ([place]) 10/22/2015  . Chronic pain syndrome (significant psychosocial component) 09/21/2015  . 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 09/21/2015  . Encounter for therapeutic drug level monitoring 09/16/2015  . Encounter for chronic pain management 09/16/2015  . Pain management 09/16/2015  . Neurogenic pain 09/16/2015  . Neuropathic pain 09/16/2015  . Musculoskeletal pain 09/16/2015  . Lumbar spondylosis (L4-5) 09/16/2015  . Chronic low back pain (Location of Primary Source of Pain) (Left) 09/16/2015  . Chronic lower extremity pain (Location of Secondary source of pain) (Left) 09/16/2015  . Chronic lumbar radicular pain (Location of Secondary source of pain) (L4 & S1 Dermatome) (Left) 09/16/2015  . Chronic shoulder pain (Right side) 09/16/2015  . Chronic upper extremity pain (Right-sided) 09/16/2015  . Cervical radiculitis (Right side) 09/16/2015  . Abnormal x-ray of lumbar spine 09/16/2015  . Chronic diastolic heart failure ([place]) 07/15/2015  . Chest pain 07/15/2015  . Tobacco abuse 07/15/2015  . Blood clotting disorder ([place]) 04/21/2015  . Decreased motor strength 07/16/2014  . Clinical depression 07/16/2014  . Chronic rhinitis 07/16/2014  . Carpal tunnel syndrome 07/16/2014  . Anxiety state 07/16/2014  . Neuritis or radiculitis due to rupture of lumbar intervertebral disc 07/02/2014  . DDD (degenerative disc disease), lumbar 07/02/2014  . CAFL (chronic airflow limitation) ([place]) 02/27/2014  . Chronic obstructive pulmonary disease ([place]) 02/27/2014  . Essential (primary) hypertension 03/27/2008   Past Surgical History:  Procedure Laterality Date  . ABDOMINAL HYSTERECTOMY    . CARPAL TUNNEL RELEASE Left   . TONSILLECTOMY    . TUBAL LIGATION     Family History  Problem Relation Age of Onset  . Heart disease Mother   . Stroke Mother   .  Coronary artery disease Mother   . Lung cancer Sister    . Alcohol abuse Father   . Heart disease Father   . Prostate cancer Neg Hx       Previous Medications   ALBUTEROL (PROVENTIL HFA;VENTOLIN HFA) 108 (90 BASE) MCG/ACT INHALER    Inhale 2 puffs into the lungs every 6 (six) hours as needed for wheezing. Reported on 11/03/2015   ALPRAZOLAM (XANAX) 0.5 MG TABLET    Take 0.5 mg by mouth 2 (two) times daily as needed for anxiety.   ASPIRIN-CAFFEINE (BC FAST PAIN RELIEF ARTHRITIS) 1000-65 MG PACK    Take 1 packet by mouth every 6 (six) hours as needed (Pain).   BISOPROLOL-HYDROCHLOROTHIAZIDE (ZIAC) 10-6.25 MG TABLET    TAKE 1 TABLET BY MOUTH DAILY   CHOLECALCIFEROL ([employer] D3) 2000 UNITS CAPS    Take 1 capsule by mouth daily.   FLUTICASONE-SALMETEROL (ADVAIR DISKUS) 250-50 MCG/DOSE AEPB    INHALE 1 PUFF INTO LUNGS TWICE A DAY AS NEEDED   FUROSEMIDE (LASIX) 40 MG TABLET    Take 1 tablet (40 mg total) by mouth daily.   HYDROCODONE-ACETAMINOPHEN (NORCO/VICODIN) 5-325 MG TABLET    Take 1 tablet by mouth every 8 (eight) hours as needed for severe pain.   HYDROCODONE-ACETAMINOPHEN (NORCO/VICODIN) 5-325 MG TABLET    Take 1 tablet by mouth every 8 (eight) hours as needed for severe pain.   HYDROCODONE-ACETAMINOPHEN (NORCO/VICODIN) 5-325 MG TABLET    Take 1 tablet by mouth every 8 (eight) hours as needed for severe pain.   IPRATROPIUM-ALBUTEROL (DUONEB) 0.5-2.5 (3) MG/3ML SOLN    Take 3 mLs by nebulization every 6 (six) hours as needed (shortness of breath).   OXYGEN    Inhale 2.5 L into the lungs continuous. Reported on 11/03/2015   POTASSIUM CHLORIDE SA (K-DUR,KLOR-CON) 20 MEQ TABLET    Take 1 tablet (20 mEq total) by mouth daily.   RANITIDINE (ZANTAC) 150 MG TABLET    Take 150 mg by mouth 2 (two) times daily.   ROFLUMILAST (DALIRESP) 500 MCG TABS TABLET    Take 500 mcg by mouth daily.   SERTRALINE (ZOLOFT) 100 MG TABLET    TAKE 1 TABLET (100 MG TOTAL) BY MOUTH DAILY.   TAMSULOSIN (FLOMAX) 0.4 MG CAPS CAPSULE    Take 1 capsule (0.4 mg total) by mouth daily.     Review of Systems  Constitutional: Negative.   Respiratory: Negative.   Cardiovascular: Negative.     Social History  Substance Use Topics  . Smoking status: Current Every Day Smoker    Packs/day: 1.00    Years: 40.00    Types: Cigarettes  . Smokeless tobacco: Never Used     Comment: 30+ years- Hasnt smoked for 11 days while in hosp and only had a few since discharged- 1/18  . Alcohol use No   Objective:   BP 100/68 (BP Location: Right Arm, Patient Position: Sitting, Cuff Size: Normal)   Pulse 87   Temp 98.2 F (36.8 C) (Oral)   Resp 20   Wt 260 lb 6.4 oz (118.1 kg)   SpO2 92%   BMI 46.87 kg/m   Physical Exam  Constitutional: She is oriented to person, place, and time. She appears well-developed and well-nourished.  obesity  HENT:  Head: Normocephalic.  Right Ear: External ear normal.  Left Ear: External ear normal.  Nose: Nose normal.  Mouth/Throat: Oropharynx is clear and moist.  Eyes: Conjunctivae and EOM are normal.  Neck: Neck supple.  Cardiovascular: Normal rate  and regular rhythm.   Pulmonary/Chest:  Distant breath sounds. No wheezes, rales or rhonchi.  Abdominal: Soft. Bowel sounds are normal.  Tenderness right posterior CVA region with percussion.  Lymphadenopathy:    She has no cervical adenopathy.  Neurological: She is alert and oriented to person, place, and time.  Psychiatric: She has a normal mood and affect. Her behavior is normal.      Assessment & Plan:     1. COPD with respiratory failure, acute ([place]) Was hospitalized for 10 days and treated with BiPAP, oxygen, steroids and antibiotics. Was initially in ICU. Improved since finishing the antibiotics and steroids. Was followed by Dr. Raul Del today and will have a recheck with him in 3 months. Is to continue Advair, Daliresp, Albuterol and Iprotropium. Should avoid cigarettes, use BiPAP at night and portable oxygen at 2 LPM 24 hours a day. States she is breathing better and eating well.  2.  Kidney stones Found a 3 mm right renal stone in the hospital. Treated with Vicodin and Flomax. Still having pain to percuss right posterior CVA region. Will schedule urology referral since she continues to have pain since discharge on 11-03-16. No gross hematuria at home. - Ambulatory referral to Urology  3. Chronic diastolic heart failure (HCC) Still on Lasix 40 mg qd with KCL 20 meq daily. Will follow up with Dr. Clayborn Bigness (cardiologist) in a month.  4. Long term current use of opiate analgesic Followed by Dr. Dossie Arbour (pain management) regarding chronic back pain with lumbar stenosis.

## 2016-11-28 ENCOUNTER — Ambulatory Visit
Admission: RE | Admit: 2016-11-28 | Discharge: 2016-11-28 | Disposition: A | Discharge status: Home / Self Care | Payer: Medicare PPO | Source: Ambulatory Visit | Attending: Urology | Admitting: Urology

## 2016-11-28 ENCOUNTER — Encounter: Payer: Self-pay | Admitting: Urology

## 2016-11-28 ENCOUNTER — Ambulatory Visit (INDEPENDENT_AMBULATORY_CARE_PROVIDER_SITE_OTHER): Payer: Medicare PPO | Admitting: Urology

## 2016-11-28 VITALS — BP 104/67 | HR 88 | Ht 62.5 in | Wt 257.8 lb

## 2016-11-28 DIAGNOSIS — N839 Noninflammatory disorder of ovary, fallopian tube and broad ligament, unspecified: Secondary | ICD-10-CM

## 2016-11-28 DIAGNOSIS — N201 Calculus of ureter: Secondary | ICD-10-CM | POA: Insufficient documentation

## 2016-11-28 DIAGNOSIS — R3129 Other microscopic hematuria: Secondary | ICD-10-CM

## 2016-11-28 DIAGNOSIS — N838 Other noninflammatory disorders of ovary, fallopian tube and broad ligament: Secondary | ICD-10-CM

## 2016-11-28 LAB — URINALYSIS, COMPLETE
Bilirubin, UA: NEGATIVE
Glucose, UA: NEGATIVE
Ketones, UA: NEGATIVE
Leukocytes, UA: NEGATIVE
Nitrite, UA: NEGATIVE
Protein, UA: NEGATIVE
Specific Gravity, UA: 1.01 (ref 1.005–1.030)
Urobilinogen, Ur: 0.2 mg/dL (ref 0.2–1.0)
pH, UA: 5 (ref 5.0–7.5)

## 2016-11-28 LAB — MICROSCOPIC EXAMINATION
Bacteria, UA: NONE SEEN
RBC, UA: >30 /hpf — AB (ref 0–?)

## 2016-11-28 MED ORDER — TAMSULOSIN HCL 0.4 MG PO CAPS: 0.4000 mg | ORAL_CAPSULE | Freq: Every day | ORAL | 3 refills | Status: DC

## 2016-11-28 NOTE — Progress Notes (Signed)
New

## 2016-11-28 NOTE — Progress Notes (Signed)
11/28/2016 11:56 AM   Theresa [name] January 04, 1956 [id number]  Referring provider: Margo Common, [place], [place] [address]  Chief Complaint  Patient presents with  . New Patient (Initial Visit)    Kidney stones referred by Dr. Jeananne Rama    HPI: Patient is a 61 year old Caucasian female who is referred by Chrismon, Ramona for nephrolithiasis with her daughter, Estill Bamberg.    Patient states the onset of the pain was sudden during a recent hospitalization for a COPD exacerbation.  It was sharp.  It lasted for several minutes.  The pain was located left flank and radiated to RLQ.  The pain was a 8/10.  Nothing made the pain better.   Nothing made the pain worse.  She did have or did not have gross hematuria, fevers, chills, nausea or vomiting.  In the hospital, her UA was positive for [facility] RBC's.  Her serum creatinine 0.60 and WBC count was 8.6.  CT Renal stone study performed on 10/31/2016 noted 3 mm stone within the proximal left ureter without associated hydronephrosis. Additional punctate nonobstructing right renal stone.  Cystic-appearing mass within the right adnexa measuring 3.4 cm greatest dimension. If premenopausal or perimenopausal, this is most compatible with a benign ovarian cyst. If postmenopausal, recommend follow-up with pelvic ultrasound to ensure benignity.  Aortic atherosclerosis.  I have independently reviewed the films.  Stone appears to be 6 x 2 mm.    Today, she is experiencing frequency, urgency, nocturia, incontinence and a weak stream.  These are baseline symptoms  She is having right flank pain today.  Pain is a 5/10.  UA today was positive for > 30 RBC's.  Nothing makes the pain worse.  Repositioning sometimes help the pain.    She does have a prior history of stones.  She passed them spontaneously.  Stone composition is unknown.    PMH significant for COPD with recent hospitalization for respiratory failure, CHF, oxygen therapy and still  smoking.         PMH: Past Medical History:  Diagnosis Date  . Anxiety   . Carpal tunnel syndrome   . CHF (congestive heart failure) ([place])   . CHF (congestive heart failure) ([place])    1/18  . Chronic generalized abdominal pain   . Chronic rhinitis   . COPD (chronic obstructive pulmonary disease) ([place])   . DDD (degenerative disc disease), cervical   . Depression   . Edema   . Flu    1/18  . Gastritis   . Hematuria   . Hypertension   . Kidney stones   . Low back pain   . Lumbar radiculitis   . Malodorous urine   . Muscle weakness   . Obesity   . Renal cyst   . Sensory urge incontinence   . Tobacco abuse     Surgical History: Past Surgical History:  Procedure Laterality Date  . ABDOMINAL HYSTERECTOMY    . CARPAL TUNNEL RELEASE Left   . TONSILLECTOMY    . TUBAL LIGATION      Home Medications:  Allergies as of 11/28/2016      Reactions   Codeine Nausea And Vomiting   Meloxicam Other (See Comments)   Stomach pain      Medication List       Accurate as of 11/28/16 11:56 AM. Always use your most recent med list.          ADVAIR DISKUS 250-50 MCG/DOSE Aepb Generic drug:  Fluticasone-Salmeterol INHALE  1 PUFF INTO LUNGS TWICE A DAY AS NEEDED   albuterol 108 (90 Base) MCG/ACT inhaler Commonly known as:  PROVENTIL HFA;VENTOLIN HFA Inhale 2 puffs into the lungs every 6 (six) hours as needed for wheezing. Reported on 11/03/2015   ALPRAZolam 0.5 MG tablet Commonly known as:  XANAX Take 0.5 mg by mouth 2 (two) times daily as needed for anxiety.   BC FAST PAIN RELIEF ARTHRITIS 1000-65 MG Pack Generic drug:  Aspirin-Caffeine Take 1 packet by mouth every 6 (six) hours as needed (Pain).   bisoprolol-hydrochlorothiazide 10-6.25 MG tablet Commonly known as:  ZIAC TAKE 1 TABLET BY MOUTH DAILY   [employer] D3 2000 units Caps Generic drug:  Cholecalciferol Take 1 capsule by mouth daily.   furosemide 40 MG tablet Commonly known as:  LASIX Take 1 tablet (40 mg total) by  mouth daily.   HYDROcodone-acetaminophen 5-325 MG tablet Commonly known as:  NORCO/VICODIN Take 1 tablet by mouth every 8 (eight) hours as needed for severe pain.   HYDROcodone-acetaminophen 5-325 MG tablet Commonly known as:  NORCO/VICODIN Take 1 tablet by mouth every 8 (eight) hours as needed for severe pain. Start taking on:  12/17/2016   HYDROcodone-acetaminophen 5-325 MG tablet Commonly known as:  NORCO/VICODIN Take 1 tablet by mouth every 8 (eight) hours as needed for severe pain. Start taking on:  01/16/2017   ipratropium-albuterol 0.5-2.5 (3) MG/3ML Soln Commonly known as:  DUONEB Take 3 mLs by nebulization every 6 (six) hours as needed (shortness of breath).   OXYGEN Inhale 2.5 L into the lungs continuous. Reported on 11/03/2015   potassium chloride SA 20 MEQ tablet Commonly known as:  K-DUR,KLOR-CON Take 1 tablet (20 mEq total) by mouth daily.   ranitidine 150 MG tablet Commonly known as:  ZANTAC Take 150 mg by mouth 2 (two) times daily.   roflumilast 500 MCG Tabs tablet Commonly known as:  DALIRESP Take 500 mcg by mouth daily.   sertraline 100 MG tablet Commonly known as:  ZOLOFT TAKE 1 TABLET (100 MG TOTAL) BY MOUTH DAILY.   tamsulosin 0.4 MG Caps capsule Commonly known as:  FLOMAX Take 1 capsule (0.4 mg total) by mouth daily.       Allergies:  Allergies  Allergen Reactions  . Codeine Nausea And Vomiting  . Meloxicam Other (See Comments)    Stomach pain    Family History: Family History  Problem Relation Age of Onset  . Heart disease Mother   . Stroke Mother   . Coronary artery disease Mother   . Lung cancer Sister   . Alcohol abuse Father   . Heart disease Father   . Prostate cancer Neg Hx   . Kidney cancer Neg Hx   . Bladder Cancer Neg Hx     Social History:  reports that she has been smoking Cigarettes.  She has a 40.00 pack-year smoking history. She has never used smokeless tobacco. She reports that she does not drink alcohol or use  drugs.  ROS: UROLOGY Frequent Urination?: Yes Hard to postpone urination?: Yes Burning/pain with urination?: No Get up at night to urinate?: Yes Leakage of urine?: Yes Urine stream starts and stops?: No Trouble starting stream?: No Do you have to strain to urinate?: No Blood in urine?: Yes Urinary tract infection?: No Sexually transmitted disease?: No Injury to kidneys or bladder?: No Painful intercourse?: No Weak stream?: Yes Currently pregnant?: No Vaginal bleeding?: No Last menstrual period?: n  Gastrointestinal Nausea?: No Vomiting?: No Indigestion/heartburn?: Yes Diarrhea?: No Constipation?: No  Constitutional Fever: No Night sweats?: Yes Weight loss?: No Fatigue?: Yes  Skin Skin rash/lesions?: Yes Itching?: Yes  Eyes Blurred vision?: Yes Double vision?: No  Ears/Nose/Throat Sore throat?: No Sinus problems?: Yes  Hematologic/Lymphatic Swollen glands?: No Easy bruising?: Yes  Cardiovascular Leg swelling?: Yes Chest pain?: Yes  Respiratory Cough?: Yes Shortness of breath?: Yes  Endocrine Excessive thirst?: No  Musculoskeletal Back pain?: Yes Joint pain?: No  Neurological Headaches?: Yes Dizziness?: No  Psychologic Depression?: Yes Anxiety?: Yes  Physical Exam: BP 104/67   Pulse 88   Ht 5' 2.5" (1.588 m)   Wt 257 lb 12.8 oz (116.9 kg)   BMI 46.40 kg/m   Constitutional: Well nourished. Alert and oriented, No acute distress. HEENT: [place] AT, moist mucus membranes. Trachea midline, no masses. Cardiovascular: No clubbing, cyanosis, or edema. Respiratory: Normal respiratory effort, no increased work of breathing. GI: Abdomen is soft, non tender, non distended, no abdominal masses. Liver and spleen not palpable.  No hernias appreciated.  Stool sample for occult testing is not indicated.   GU: No CVA tenderness.  No bladder fullness or masses.   Skin: No rashes, bruises or suspicious lesions. Lymph: No cervical or inguinal  adenopathy. Neurologic: Grossly intact, no focal deficits, moving all 4 extremities. Psychiatric: Normal mood and affect.  Laboratory Data: Lab Results  Component Value Date   WBC 8.6 11/03/2016   HGB 13.7 11/03/2016   HCT 40.6 11/03/2016   MCV 97.8 11/03/2016   PLT 133 (L) 11/03/2016    Lab Results  Component Value Date   CREATININE 0.74 11/01/2016    Lab Results  Component Value Date   TSH 0.993 04/18/2015    Lab Results  Component Value Date   AST 21 10/24/2016   Lab Results  Component Value Date   ALT 22 10/24/2016     Urinalysis > 30 RBC's.  See EPIC.     Pertinent Imaging: CLINICAL DATA:  Right flank pain this morning.  EXAM: CT ABDOMEN AND PELVIS WITHOUT CONTRAST  TECHNIQUE: Multidetector CT imaging of the abdomen and pelvis was performed following the standard protocol without IV contrast.  COMPARISON:  CT abdomen dated 07/21/2014  FINDINGS: Lower chest: Mild atelectasis/ scarring at each lung base.  Hepatobiliary: Small hypodense foci are again seen within the liver, too small to definitively characterize but not significantly changed compared to the previous exam and compatible with benign cysts. No acute or suspicious finding within the liver. Gallbladder is contracted. No bile duct dilatation seen.  Pancreas: Unremarkable. No pancreatic ductal dilatation or surrounding inflammatory changes.  Spleen: Normal in size without focal abnormality.  Adrenals/Urinary Tract: Adrenal glands appear normal. Punctate nonobstructing right renal stone. 3 mm stone within the proximal left ureter without associated hydronephrosis. Bladder appears normal, partially decompressed.  Stomach/Bowel: Bowel is normal in caliber. No bowel wall thickening or evidence of bowel wall inflammation seen. Appendix is normal. Stomach appears normal.  Vascular/Lymphatic: Heavy atherosclerotic changes of the normal caliber abdominal aorta and pelvic  vasculature. No enlarged lymph nodes identified in the abdomen or pelvis.  Reproductive: cystic-appearing mass within the right adnexa measuring 3.4 cm greatest dimension. Otherwise unremarkable.  Other: No free fluid or abscess collection. No free intraperitoneal air.  Musculoskeletal: Degenerative change in the lower lumbar spine, mild to moderate in degree. No acute or suspicious osseous finding.  Periumbilical abdominal wall hernia which contains fat only. Small foci of air within the subcutaneous soft tissues of the left abdominal wall, presumably related to recent injection site.  IMPRESSION: 1. 3 mm stone within the proximal left ureter without associated hydronephrosis. Additional punctate nonobstructing right renal stone. 2. Cystic-appearing mass within the right adnexa measuring 3.4 cm greatest dimension. If premenopausal or perimenopausal, this is most compatible with a benign ovarian cyst. If postmenopausal, recommend follow-up with pelvic ultrasound to ensure benignity. 3. Aortic atherosclerosis.   Electronically Signed   By: Franki Cabot M.D.   On: 10/31/2016 22:08   Assessment & Plan:    1. Left ureteral stone  - continue tamsulosin 0.4 mg daily  - continue to push fluids  - ? If stone passed as she is not having left flank pain  - KUB today  - Advised to contact our office or seek treatment in the ED if becomes febrile or pain/ vomiting are difficult control in order to arrange for emergent/urgent intervention  2. Microscopic hematuria  - UA  - urine culture  - We will continue to monitor the patient's UA after the treatment/passage of the stone to ensure the hematuria has resolved.  If hematuria persists, we will pursue a hematuria workup with CT Urogram and cystoscopy if appropriate.  3. Right ovarian mass  - needs to follow up with PCP concerning this finding on CT  - may be the cause of her pain as she is complaining of pain on the right  side   Return for pending KUB.  These notes generated with voice recognition software. I apologize for typographical errors.  Zara Council, [facility] [address] [place], [place] [address] [phone]

## 2016-11-29 ENCOUNTER — Telehealth: Payer: Self-pay

## 2016-11-29 DIAGNOSIS — N200 Calculus of kidney: Secondary | ICD-10-CM

## 2016-11-29 NOTE — Telephone Encounter (Signed)
-----  Message from Nori Riis, PA-C sent at 11/28/2016  4:23 PM EST ----- Please let the patient know that her left stone is either gone or has migrated into the bladder.  I suggest continuing the Flomax and repeating the KUB in one week.  Please remind her to contact her PCP about her ovarian mass.

## 2016-11-29 NOTE — Telephone Encounter (Signed)
Spoke with pt in reference to kidney stone, KUB, and ovarian mass. Pt voiced understanding of whole conversation.

## 2016-12-01 ENCOUNTER — Telehealth: Payer: Self-pay

## 2016-12-01 ENCOUNTER — Ambulatory Visit (INDEPENDENT_AMBULATORY_CARE_PROVIDER_SITE_OTHER): Payer: Medicare PPO | Admitting: Family Medicine

## 2016-12-01 ENCOUNTER — Telehealth: Payer: Self-pay | Admitting: Family Medicine

## 2016-12-01 ENCOUNTER — Encounter: Payer: Self-pay | Admitting: Family Medicine

## 2016-12-01 VITALS — BP 128/84 | HR 77 | Temp 98.1°F | Resp 18 | Wt 263.6 lb

## 2016-12-01 DIAGNOSIS — Z87442 Personal history of urinary calculi: Secondary | ICD-10-CM

## 2016-12-01 DIAGNOSIS — J449 Chronic obstructive pulmonary disease, unspecified: Secondary | ICD-10-CM

## 2016-12-01 DIAGNOSIS — N390 Urinary tract infection, site not specified: Secondary | ICD-10-CM

## 2016-12-01 DIAGNOSIS — R1903 Right lower quadrant abdominal swelling, mass and lump: Secondary | ICD-10-CM

## 2016-12-01 LAB — CULTURE, URINE COMPREHENSIVE

## 2016-12-01 MED ORDER — AMOXICILLIN-POT CLAVULANATE 875-125 MG PO TABS: 1.0000 | ORAL_TABLET | Freq: Two times a day (BID) | ORAL | 0 refills | Status: AC

## 2016-12-01 NOTE — Progress Notes (Signed)
Patient: Theresa [name] Female    DOB: 1956-07-09   61 y.o.   MRN: [id number] Visit Date: 12/01/2016  Today's Provider: [name], PA   Chief Complaint  Patient presents with  . Follow-up   Subjective:    HPI Patient is here to follow up from [place] at [facility]. Patient was seen by Larene Beach on 11/28/2016 and a KUB was done. KUB results showed left stone is gone or has migrated into the bladder. Patient was advised to continue Flomax, repeat KUB in 1 week and follow up with PCP for right ovarian mass. Patient is still having lower right side back pain. CT scan that showed stones on 10-31-16 and an incidental finding of a right adnexal cystic mass (3.4 cm). Has a history of partial hysterectomy for uterine fibroids  Patient Active Problem List   Diagnosis Date Noted  . Lumbar facet hypertrophy (multilevel) 11/17/2016  . Lumbar spinal stenosis (L4-5) 11/17/2016  . 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) 11/17/2016  . 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 Lumbosacral radiculopathy at L4 11/17/2016  . Chronic hip pain (Location of Primary Source of Pain) (Left) 11/01/2016  . Chronic hip pain (Location of Primary Source of Pain) (Right) 11/01/2016  . Acute respiratory failure ([place]) 10/25/2016  . Supplemental oxygen dependent 08/01/2016  . Sleep apnea 06/02/2016  . Long term current use of opiate analgesic 01/12/2016  . Long term prescription opiate use 01/12/2016  . Opiate use (15 MME/Day) 01/12/2016  . Lumbar facet syndrome (Location of Primary Source of Pain) (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) 01/12/2016  . Chronic sacroiliac joint pain (Bilateral) 01/12/2016  . Vitamin D deficiency 12/30/2015  . Chronic hip pain (Location of Primary Source of Pain) (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) 12/28/2015  . 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 Obesity, Class III, BMI 40-49.9 (morbid obesity) ([place]) 12/28/2015  . Pulmonary hypertensive arterial disease 11/19/2015  .  Coagulation disorder ([place]) 10/22/2015  . Chronic pain syndrome (significant psychosocial component) 09/21/2015  . 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 09/21/2015  . Encounter for therapeutic drug level monitoring 09/16/2015  . Encounter for chronic pain management 09/16/2015  . Pain management 09/16/2015  . Neurogenic pain 09/16/2015  . Neuropathic pain 09/16/2015  . Musculoskeletal pain 09/16/2015  . Lumbar spondylosis (L4-5) 09/16/2015  . Chronic low back pain (Location of Primary Source of Pain) (Left) 09/16/2015  . Chronic lower extremity pain (Location of Secondary source of pain) (Left) 09/16/2015  . Chronic lumbar radicular pain (Location of Secondary source of pain) (L4 & S1 Dermatome) (Left) 09/16/2015  . Chronic shoulder pain (Right side) 09/16/2015  . Chronic upper extremity pain (Right-sided) 09/16/2015  . Cervical radiculitis (Right side) 09/16/2015  . Abnormal x-ray of lumbar spine 09/16/2015  . Chronic diastolic heart failure ([place]) 07/15/2015  . Chest pain 07/15/2015  . Tobacco abuse 07/15/2015  . Blood clotting disorder ([place]) 04/21/2015  . Decreased motor strength 07/16/2014  . Clinical depression 07/16/2014  . Chronic rhinitis 07/16/2014  . Carpal tunnel syndrome 07/16/2014  . Anxiety state 07/16/2014  . Neuritis or radiculitis due to rupture of lumbar intervertebral disc 07/02/2014  . DDD (degenerative disc disease), lumbar 07/02/2014  . CAFL (chronic airflow limitation) ([place]) 02/27/2014  . Chronic obstructive pulmonary disease ([place]) 02/27/2014  . Essential (primary) hypertension 03/27/2008   Past Surgical History:  Procedure Laterality Date  . ABDOMINAL HYSTERECTOMY    . CARPAL TUNNEL RELEASE Left   . TONSILLECTOMY    . TUBAL LIGATION  Family History  Problem Relation Age of Onset  . Heart disease Mother   . Stroke Mother   . Coronary artery disease Mother   . Lung cancer Sister   . Alcohol abuse Father   . Heart disease  Father   . Prostate cancer Neg Hx   . Kidney cancer Neg Hx   . Bladder Cancer Neg Hx    Allergies  Allergen Reactions  . Codeine Nausea And Vomiting  . Meloxicam Other (See Comments)    Stomach pain     Previous Medications   ALBUTEROL (PROVENTIL HFA;VENTOLIN HFA) 108 (90 BASE) MCG/ACT INHALER    Inhale 2 puffs into the lungs every 6 (six) hours as needed for wheezing. Reported on 11/03/2015   ALPRAZOLAM (XANAX) 0.5 MG TABLET    Take 0.5 mg by mouth 2 (two) times daily as needed for anxiety.   ASPIRIN-CAFFEINE (BC FAST PAIN RELIEF ARTHRITIS) 1000-65 MG PACK    Take 1 packet by mouth every 6 (six) hours as needed (Pain).   BISOPROLOL-HYDROCHLOROTHIAZIDE (ZIAC) 10-6.25 MG TABLET    TAKE 1 TABLET BY MOUTH DAILY   CHOLECALCIFEROL ([employer]S D3) 2000 UNITS CAPS    Take 1 capsule by mouth daily.   FLUTICASONE-SALMETEROL (ADVAIR DISKUS) 250-50 MCG/DOSE AEPB    INHALE 1 PUFF INTO LUNGS TWICE A DAY AS NEEDED   FUROSEMIDE (LASIX) 40 MG TABLET    Take 1 tablet (40 mg total) by mouth daily.   HYDROCODONE-ACETAMINOPHEN (NORCO/VICODIN) 5-325 MG TABLET    Take 1 tablet by mouth every 8 (eight) hours as needed for severe pain.   HYDROCODONE-ACETAMINOPHEN (NORCO/VICODIN) 5-325 MG TABLET    Take 1 tablet by mouth every 8 (eight) hours as needed for severe pain.   HYDROCODONE-ACETAMINOPHEN (NORCO/VICODIN) 5-325 MG TABLET    Take 1 tablet by mouth every 8 (eight) hours as needed for severe pain.   IPRATROPIUM-ALBUTEROL (DUONEB) 0.5-2.5 (3) MG/3ML SOLN    Take 3 mLs by nebulization every 6 (six) hours as needed (shortness of breath).   OXYGEN    Inhale 2.5 L into the lungs continuous. Reported on 11/03/2015   POTASSIUM CHLORIDE SA (K-DUR,KLOR-CON) 20 MEQ TABLET    Take 1 tablet (20 mEq total) by mouth daily.   RANITIDINE (ZANTAC) 150 MG TABLET    Take 150 mg by mouth 2 (two) times daily.   ROFLUMILAST (DALIRESP) 500 MCG TABS TABLET    Take 500 mcg by mouth daily.   SERTRALINE (ZOLOFT) 100 MG TABLET    TAKE 1 TABLET  (100 MG TOTAL) BY MOUTH DAILY.   TAMSULOSIN (FLOMAX) 0.4 MG CAPS CAPSULE    Take 1 capsule (0.4 mg total) by mouth daily.    Review of Systems  Constitutional: Negative.   Respiratory: Negative.   Cardiovascular: Negative.   Musculoskeletal: Positive for back pain.    Social History  Substance Use Topics  . Smoking status: Current Every Day Smoker    Packs/day: 1.00    Years: 40.00    Types: Cigarettes  . Smokeless tobacco: Never Used     Comment: first day of patch patient states she's trying  . Alcohol use No   Objective:   BP 128/84 (BP Location: Right Arm, Patient Position: Sitting, Cuff Size: Normal)   Pulse 77   Temp 98.1 F (36.7 C) (Oral)   Resp 18   Wt 263 lb 9.6 oz (119.6 kg)   SpO2 96%   BMI 47.44 kg/m   Physical Exam  Constitutional: She is oriented to person,  place, and time. She appears well-developed and well-nourished. No distress.  HENT:  Head: Normocephalic and atraumatic.  Right Ear: Hearing normal.  Left Ear: Hearing normal.  Nose: Nose normal.  Eyes: Conjunctivae and lids are normal. Right eye exhibits no discharge. Left eye exhibits no discharge. No scleral icterus.  Neck: Neck supple.  Cardiovascular: Normal rate and regular rhythm.   Pulmonary/Chest: Effort normal. She has no wheezes. She has no rales.  Abdominal: Soft. Bowel sounds are normal.  Neurological: She is alert and oriented to person, place, and time.  Skin: Skin is intact. No lesion and no rash noted.  Psychiatric: She has a normal mood and affect. Her speech is normal and behavior is normal. Thought content normal.      Assessment & Plan:     1. RLQ abdominal mass A 3.4 cm cystic appearing mass in the right adnexal region on CT scan (10-31-16) during hospitalization. Admits to rare RLQ discomfort for a few seconds but no dysuria or vaginal discharge. Gives a history of BTL in 1987 and hysterectomy in 2008 for uterine fibroids but unsure about oophorectomies. Will get pelvic and  transvaginal ultrasound. May need referral to GYN. - US Pelvis Complete - US Transvaginal Non-OB  2. Chronic obstructive pulmonary disease, unspecified COPD type ([place]) Stable on oxygen a 2LPM continuously by nasal cannula, BiPAP, Advair, Daliresp, Albuterol and Ipratropium. Continues pulmonology follow up.  3. History of kidney stones Evaluation by urology on 11-28-16 revealed no further left flank pain and felt the stone probably has passed into the bladder. Will continue increased water intake and Flomax.

## 2016-12-01 NOTE — Telephone Encounter (Signed)
Spoke with pt in reference to +ucx. Made aware abx were sent to pharmacy and needs to add a probiotic. Pt voiced understanding.

## 2016-12-01 NOTE — Telephone Encounter (Signed)
Per scheduling at [facility] when ordering pelvic ultrasound an order for transvaginal ultrasound is also needed,Thanks

## 2016-12-01 NOTE — Telephone Encounter (Signed)
-----  Message from Nori Riis, PA-C sent at 12/01/2016  2:49 PM EST ----- Patient has a +UCx.  They need to start Augmentin 875/125,  one tablet twice daily for seven days.  They also need to take a probiotic with the antibiotic course.  The dosage is listed below:  L. acidophilus and L. casei (25 x 109 CFU/day for 2 days, then 50 x 109 CFU/day for duration of the antibiotic course)

## 2016-12-02 NOTE — Telephone Encounter (Signed)
Placed order in chart.

## 2016-12-06 ENCOUNTER — Ambulatory Visit
Admission: RE | Admit: 2016-12-06 | Discharge: 2016-12-06 | Disposition: A | Discharge status: Home / Self Care | Payer: Medicare PPO | Source: Ambulatory Visit | Attending: Urology | Admitting: Urology

## 2016-12-06 DIAGNOSIS — N200 Calculus of kidney: Secondary | ICD-10-CM

## 2016-12-06 DIAGNOSIS — I878 Other specified disorders of veins: Secondary | ICD-10-CM | POA: Diagnosis not present

## 2016-12-07 ENCOUNTER — Telehealth: Payer: Self-pay

## 2016-12-07 NOTE — Telephone Encounter (Signed)
Advised patient of the KUB.  She stated that she has an abdominal US scheduled for the 27th to look at a mass on her ovary.

## 2016-12-07 NOTE — Telephone Encounter (Signed)
LMOM

## 2016-12-07 NOTE — Telephone Encounter (Signed)
-----  Message from Nori Riis, PA-C sent at 12/07/2016  8:16 AM EST ----- Please notify the patient that no definite stone was seen on KUB.   I suggest she have a RUS to ensure the swelling in the kidney has gone.

## 2016-12-08 ENCOUNTER — Telehealth: Payer: Self-pay

## 2016-12-08 DIAGNOSIS — J441 Chronic obstructive pulmonary disease with (acute) exacerbation: Secondary | ICD-10-CM | POA: Diagnosis not present

## 2016-12-08 DIAGNOSIS — I509 Heart failure, unspecified: Secondary | ICD-10-CM | POA: Diagnosis not present

## 2016-12-08 DIAGNOSIS — M5416 Radiculopathy, lumbar region: Secondary | ICD-10-CM | POA: Diagnosis not present

## 2016-12-08 DIAGNOSIS — F418 Other specified anxiety disorders: Secondary | ICD-10-CM | POA: Diagnosis not present

## 2016-12-08 DIAGNOSIS — J9622 Acute and chronic respiratory failure with hypercapnia: Secondary | ICD-10-CM | POA: Diagnosis not present

## 2016-12-08 DIAGNOSIS — I110 Hypertensive heart disease with heart failure: Secondary | ICD-10-CM | POA: Diagnosis not present

## 2016-12-08 DIAGNOSIS — J9621 Acute and chronic respiratory failure with hypoxia: Secondary | ICD-10-CM | POA: Diagnosis not present

## 2016-12-08 NOTE — Telephone Encounter (Signed)
-----  Message from Nori Riis, PA-C sent at 12/07/2016  8:16 AM EST ----- Please notify the patient that no definite stone was seen on KUB.   I suggest she have a RUS to ensure the swelling in the kidney has gone.

## 2016-12-09 ENCOUNTER — Telehealth: Payer: Self-pay | Admitting: Family

## 2016-12-09 ENCOUNTER — Other Ambulatory Visit: Payer: Self-pay

## 2016-12-09 MED ORDER — ALPRAZOLAM 0.5 MG PO TABS: 0.5000 mg | ORAL_TABLET | Freq: Two times a day (BID) | ORAL | 0 refills | Status: DC | PRN

## 2016-12-09 NOTE — Telephone Encounter (Signed)
Patient says that she still hasn't gotten an appointment scheduled with [facility] since we made the referral ago weeks ago. She is asking if I would call her in her alprazolam 0.23m for one month until she is able to get an appointment scheduled. She has left 2 messages at the place this week.   Advised patient that I would call in alprazolam 0.515m#60 to take 1 twice daily PRn without refills.

## 2016-12-09 NOTE — Telephone Encounter (Signed)
OK to cancel. Will take this off her medication list.

## 2016-12-09 NOTE — Telephone Encounter (Signed)
Pt would like to cancel request for rx.

## 2016-12-13 ENCOUNTER — Encounter (HOSPITAL_COMMUNITY): Payer: Self-pay

## 2016-12-13 ENCOUNTER — Ambulatory Visit
Admission: RE | Admit: 2016-12-13 | Discharge: 2016-12-13 | Disposition: A | Discharge status: Home / Self Care | Payer: Medicare PPO | Source: Ambulatory Visit | Attending: Family Medicine | Admitting: Family Medicine

## 2016-12-13 DIAGNOSIS — R1903 Right lower quadrant abdominal swelling, mass and lump: Secondary | ICD-10-CM | POA: Insufficient documentation

## 2016-12-15 ENCOUNTER — Other Ambulatory Visit: Payer: Self-pay | Admitting: Physician Assistant

## 2016-12-16 ENCOUNTER — Other Ambulatory Visit: Payer: Self-pay | Admitting: Family Medicine

## 2016-12-16 ENCOUNTER — Telehealth: Payer: Self-pay

## 2016-12-16 DIAGNOSIS — N9489 Other specified conditions associated with female genital organs and menstrual cycle: Secondary | ICD-10-CM

## 2016-12-16 NOTE — Telephone Encounter (Signed)
-----  Message from [place], [place] sent at 12/16/2016  1:14 PM EST ----- Cystic lesion in the right adnexa with some endometrial stripe mildly thickened. Recommend referral  to GYN for possible endometrial biopsy to rule out any carcinoma.

## 2016-12-16 NOTE — Telephone Encounter (Signed)
Patient advised. Referral has been placed. Patient advised she will get a call back when appointment has been scheduled.

## 2016-12-23 ENCOUNTER — Encounter: Payer: Self-pay | Admitting: Obstetrics and Gynecology

## 2016-12-23 ENCOUNTER — Ambulatory Visit (INDEPENDENT_AMBULATORY_CARE_PROVIDER_SITE_OTHER): Payer: Medicare PPO | Admitting: Obstetrics and Gynecology

## 2016-12-23 VITALS — BP 126/74 | HR 89 | Wt 258.4 lb

## 2016-12-23 DIAGNOSIS — N83209 Unspecified ovarian cyst, unspecified side: Secondary | ICD-10-CM

## 2016-12-23 DIAGNOSIS — R938 Abnormal findings on diagnostic imaging of other specified body structures: Secondary | ICD-10-CM | POA: Diagnosis not present

## 2016-12-23 DIAGNOSIS — R9389 Abnormal findings on diagnostic imaging of other specified body structures: Secondary | ICD-10-CM

## 2016-12-23 NOTE — Progress Notes (Signed)
HPI:      [name] is a 61 y.o. [id number] who LMP was No LMP recorded. Patient is postmenopausal.  Subjective:   She presents today  For discussion of ultrasound revealing a small ovarian cyst and a slightly thickened endometrium for someone in menopause. The patient is somewhat surprised by this because she thought she had a hysterectomy performed in the past for uterine fibroids. She has experienced no postmenopausal bleeding in 8 years and although she has occasional pelvic pain it is not disabling. Patient has multiple medical issues. Please see medical history below.    Hx: The following portions of the patient's history were reviewed and updated as appropriate:              She  has a past medical history of Anxiety; Carpal tunnel syndrome; CHF (congestive heart failure) ([place]); CHF (congestive heart failure) ([place]); Chronic generalized abdominal pain; Chronic rhinitis; COPD (chronic obstructive pulmonary disease) (HCC); DDD (degenerative disc disease), cervical; Depression; Edema; Flu; Gastritis; Hematuria; Hypertension; Kidney stones; Low back pain; Lumbar radiculitis; Malodorous urine; Muscle weakness; Obesity; Renal cyst; Sensory urge incontinence; and Tobacco abuse. She  does not have any pertinent problems on file. She  has a past surgical history that includes Tubal ligation; Abdominal hysterectomy; Tonsillectomy; and Carpal tunnel release (Left). Her family history includes Alcohol abuse in her father; Coronary artery disease in her mother; Heart disease in her father and mother; Lung cancer in her sister; Stroke in her mother. She  reports that she has been smoking Cigarettes.  She has a 40.00 pack-year smoking history. She has never used smokeless tobacco. She reports that she does not drink alcohol or use drugs. Current Outpatient Prescriptions on File Prior to Visit  Medication Sig Dispense Refill  . albuterol (PROVENTIL HFA;VENTOLIN HFA) 108 (90 Base) MCG/ACT inhaler Inhale  2 puffs into the lungs every 6 (six) hours as needed for wheezing. Reported on 11/03/2015 1 Inhaler 2  . ALPRAZolam (XANAX) 0.5 MG tablet Take 1 tablet (0.5 mg total) by mouth 2 (two) times daily as needed for anxiety. 60 tablet 0  . Aspirin-Caffeine (BC FAST PAIN RELIEF ARTHRITIS) 1000-65 MG PACK Take 1 packet by mouth every 6 (six) hours as needed (Pain).    . bisoprolol-hydrochlorothiazide (ZIAC) 10-6.25 MG tablet TAKE 1 TABLET BY MOUTH DAILY 90 tablet 1  . Cholecalciferol ([employer]S D3) 2000 units CAPS Take 1 capsule by mouth daily.    . Fluticasone-Salmeterol (ADVAIR DISKUS) 250-50 MCG/DOSE AEPB INHALE 1 PUFF INTO LUNGS TWICE A DAY AS NEEDED    . furosemide (LASIX) 40 MG tablet Take 1 tablet (40 mg total) by mouth daily. 90 tablet 4  . [START ON 01/16/2017] HYDROcodone-acetaminophen (NORCO/VICODIN) 5-325 MG tablet Take 1 tablet by mouth every 8 (eight) hours as needed for severe pain. 90 tablet 0  . ipratropium-albuterol (DUONEB) 0.5-2.5 (3) MG/3ML SOLN Take 3 mLs by nebulization every 6 (six) hours as needed (shortness of breath). 360 mL 3  . OXYGEN Inhale 2.5 L into the lungs continuous. Reported on 11/03/2015    . potassium chloride SA (K-DUR,KLOR-CON) 20 MEQ tablet Take 1 tablet (20 mEq total) by mouth daily. 90 tablet 4  . ranitidine (ZANTAC) 150 MG tablet Take 150 mg by mouth 2 (two) times daily.    . roflumilast (DALIRESP) 500 MCG TABS tablet Take 500 mcg by mouth daily.    . sertraline (ZOLOFT) 100 MG tablet TAKE 1 TABLET (100 MG TOTAL) BY MOUTH DAILY. 90 tablet 4  . ALPRAZolam (  XANAX) 0.5 MG tablet Take 0.5 mg by mouth 2 (two) times daily as needed for anxiety.    Marland Kitchen HYDROcodone-acetaminophen (NORCO/VICODIN) 5-325 MG tablet Take 1 tablet by mouth every 8 (eight) hours as needed for severe pain. 90 tablet 0  . HYDROcodone-acetaminophen (NORCO/VICODIN) 5-325 MG tablet Take 1 tablet by mouth every 8 (eight) hours as needed for severe pain. (Patient not taking: Reported on 12/23/2016) 90 tablet 0  .  tamsulosin (FLOMAX) 0.4 MG CAPS capsule Take 1 capsule (0.4 mg total) by mouth daily. (Patient not taking: Reported on 12/23/2016) 30 capsule 3   No current facility-administered medications on file prior to visit.          Review of Systems:  Review of Systems  Constitutional: Denied constitutional symptoms, night sweats, recent illness, fatigue, fever, insomnia and weight loss.  Eyes: Denied eye symptoms, eye pain, photophobia, vision change and visual disturbance.  Ears/Nose/Throat/Neck: Denied ear, nose, throat or neck symptoms, hearing loss, nasal discharge, sinus congestion and sore throat.  Cardiovascular: Denied cardiovascular symptoms, arrhythmia, chest pain/pressure, edema, exercise intolerance, orthopnea and palpitations.  Respiratory: Denied pulmonary symptoms, asthma, pleuritic pain, productive sputum, cough, dyspnea and wheezing.  Gastrointestinal: Denied, gastro-esophageal reflux, melena, nausea and vomiting.  Genitourinary: Denied genitourinary symptoms including symptomatic vaginal discharge, pelvic relaxation issues, and urinary complaints.  Musculoskeletal: Denied musculoskeletal symptoms, stiffness, swelling, muscle weakness and myalgia.  Dermatologic: Denied dermatology symptoms, rash and scar.  Neurologic: Denied neurology symptoms, dizziness, headache, neck pain and syncope.  Psychiatric: Denied psychiatric symptoms, anxiety and depression.  Endocrine: Denied endocrine symptoms including hot flashes and night sweats.   Meds:   Current Outpatient Prescriptions on File Prior to Visit  Medication Sig Dispense Refill  . albuterol (PROVENTIL HFA;VENTOLIN HFA) 108 (90 Base) MCG/ACT inhaler Inhale 2 puffs into the lungs every 6 (six) hours as needed for wheezing. Reported on 11/03/2015 1 Inhaler 2  . ALPRAZolam (XANAX) 0.5 MG tablet Take 1 tablet (0.5 mg total) by mouth 2 (two) times daily as needed for anxiety. 60 tablet 0  . Aspirin-Caffeine (BC FAST PAIN RELIEF ARTHRITIS)  1000-65 MG PACK Take 1 packet by mouth every 6 (six) hours as needed (Pain).    . bisoprolol-hydrochlorothiazide (ZIAC) 10-6.25 MG tablet TAKE 1 TABLET BY MOUTH DAILY 90 tablet 1  . Cholecalciferol (CVS D3) 2000 units CAPS Take 1 capsule by mouth daily.    . Fluticasone-Salmeterol (ADVAIR DISKUS) 250-50 MCG/DOSE AEPB INHALE 1 PUFF INTO LUNGS TWICE A DAY AS NEEDED    . furosemide (LASIX) 40 MG tablet Take 1 tablet (40 mg total) by mouth daily. 90 tablet 4  . [START ON 01/16/2017] HYDROcodone-acetaminophen (NORCO/VICODIN) 5-325 MG tablet Take 1 tablet by mouth every 8 (eight) hours as needed for severe pain. 90 tablet 0  . ipratropium-albuterol (DUONEB) 0.5-2.5 (3) MG/3ML SOLN Take 3 mLs by nebulization every 6 (six) hours as needed (shortness of breath). 360 mL 3  . OXYGEN Inhale 2.5 L into the lungs continuous. Reported on 11/03/2015    . potassium chloride SA (K-DUR,KLOR-CON) 20 MEQ tablet Take 1 tablet (20 mEq total) by mouth daily. 90 tablet 4  . ranitidine (ZANTAC) 150 MG tablet Take 150 mg by mouth 2 (two) times daily.    . roflumilast (DALIRESP) 500 MCG TABS tablet Take 500 mcg by mouth daily.    . sertraline (ZOLOFT) 100 MG tablet TAKE 1 TABLET (100 MG TOTAL) BY MOUTH DAILY. 90 tablet 4  . ALPRAZolam (XANAX) 0.5 MG tablet Take 0.5 mg by mouth 2 (  two) times daily as needed for anxiety.    Marland Kitchen HYDROcodone-acetaminophen (NORCO/VICODIN) 5-325 MG tablet Take 1 tablet by mouth every 8 (eight) hours as needed for severe pain. 90 tablet 0  . HYDROcodone-acetaminophen (NORCO/VICODIN) 5-325 MG tablet Take 1 tablet by mouth every 8 (eight) hours as needed for severe pain. (Patient not taking: Reported on 12/23/2016) 90 tablet 0  . tamsulosin (FLOMAX) 0.4 MG CAPS capsule Take 1 capsule (0.4 mg total) by mouth daily. (Patient not taking: Reported on 12/23/2016) 30 capsule 3   No current facility-administered medications on file prior to visit.     Objective:     Vitals:   12/23/16 1002  BP: 126/74  Pulse:  89              Ultrasound results reviewed directly with the patient. Size of the cyst and endometrial thickness shown and reviewed.  Assessment:    [id number] Patient Active Problem List   Diagnosis Date Noted  . Lumbar facet hypertrophy (multilevel) 11/17/2016  . Lumbar spinal stenosis (L4-5) 11/17/2016  . 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) 11/17/2016  . 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 Lumbosacral radiculopathy at L4 11/17/2016  . Chronic hip pain (Location of Primary Source of Pain) (Left) 11/01/2016  . Chronic hip pain (Location of Primary Source of Pain) (Right) 11/01/2016  . Acute respiratory failure ([place]) 10/25/2016  . Supplemental oxygen dependent 08/01/2016  . Sleep apnea 06/02/2016  . Long term current use of opiate analgesic 01/12/2016  . Long term prescription opiate use 01/12/2016  . Opiate use (15 MME/Day) 01/12/2016  . Lumbar facet syndrome (Location of Primary Source of Pain) (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) 01/12/2016  . Chronic sacroiliac joint pain (Bilateral) 01/12/2016  . Vitamin D deficiency 12/30/2015  . Chronic hip pain (Location of Primary Source of Pain) (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) 12/28/2015  . 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 Obesity, Class III, BMI 40-49.9 (morbid obesity) ([place]) 12/28/2015  . Pulmonary hypertensive arterial disease 11/19/2015  . Coagulation disorder ([place]) 10/22/2015  . Chronic pain syndrome (significant psychosocial component) 09/21/2015  . 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 09/21/2015  . Encounter for therapeutic drug level monitoring 09/16/2015  . Encounter for chronic pain management 09/16/2015  . Pain management 09/16/2015  . Neurogenic pain 09/16/2015  . Neuropathic pain 09/16/2015  . Musculoskeletal pain 09/16/2015  . Lumbar spondylosis (L4-5) 09/16/2015  . Chronic low back pain (Location of Primary Source of Pain) (Left) 09/16/2015  . Chronic lower  extremity pain (Location of Secondary source of pain) (Left) 09/16/2015  . Chronic lumbar radicular pain (Location of Secondary source of pain) (L4 & S1 Dermatome) (Left) 09/16/2015  . Chronic shoulder pain (Right side) 09/16/2015  . Chronic upper extremity pain (Right-sided) 09/16/2015  . Cervical radiculitis (Right side) 09/16/2015  . Abnormal x-ray of lumbar spine 09/16/2015  . Chronic diastolic heart failure ([place]) 07/15/2015  . Chest pain 07/15/2015  . Tobacco abuse 07/15/2015  . Blood clotting disorder ([place]) 04/21/2015  . Decreased motor strength 07/16/2014  . Clinical depression 07/16/2014  . Chronic rhinitis 07/16/2014  . Carpal tunnel syndrome 07/16/2014  . Anxiety state 07/16/2014  . Neuritis or radiculitis due to rupture of lumbar intervertebral disc 07/02/2014  . DDD (degenerative disc disease), lumbar 07/02/2014  . CAFL (chronic airflow limitation) ([place]) 02/27/2014  . Chronic obstructive pulmonary disease ([place]) 02/27/2014  . Essential (primary) hypertension 03/27/2008     1. Cyst of ovary, unspecified laterality   2. Increased endometrial  stripe thickness     Small simple appearing ovarian cyst. Likely benign. Currently asymptomatic.  Endometrial stripe slightly thickened without postmenopausal bleeding.   Plan:            1.  We've discussed multiple options based on the differential diagnoses of each of the above conditions. The plan will be to perform a follow-up ultrasound as indicated to check the ovarian cyst for enlargement and Doppler flow as well as remeasure the endometrial thickness. Management will be based on the above findings. Endometrial biopsy as needed. Orders Orders Placed This Encounter  Procedures  . US Transvaginal Non-OB          F/U  Return in about 8 weeks (around 02/17/2017). I spent 31 minutes with this patient of which greater than 50% was spent discussing ovarian cyst, management and follow-up, endometrial thickness management and  follow-up.  Finis Bud, M.D. 12/23/2016 11:02 AM

## 2017-01-09 ENCOUNTER — Ambulatory Visit: Payer: Self-pay | Admitting: Family

## 2017-01-11 ENCOUNTER — Ambulatory Visit: Payer: Medicare PPO | Attending: Family | Admitting: Family

## 2017-01-11 ENCOUNTER — Encounter: Payer: Self-pay | Admitting: Family

## 2017-01-11 VITALS — BP 109/65 | HR 80 | Resp 18 | Ht 63.0 in | Wt 255.5 lb

## 2017-01-11 DIAGNOSIS — J449 Chronic obstructive pulmonary disease, unspecified: Secondary | ICD-10-CM | POA: Insufficient documentation

## 2017-01-11 DIAGNOSIS — Z5189 Encounter for other specified aftercare: Secondary | ICD-10-CM | POA: Diagnosis not present

## 2017-01-11 DIAGNOSIS — F418 Other specified anxiety disorders: Secondary | ICD-10-CM | POA: Insufficient documentation

## 2017-01-11 DIAGNOSIS — Z885 Allergy status to narcotic agent status: Secondary | ICD-10-CM | POA: Insufficient documentation

## 2017-01-11 DIAGNOSIS — G8929 Other chronic pain: Secondary | ICD-10-CM | POA: Insufficient documentation

## 2017-01-11 DIAGNOSIS — F17210 Nicotine dependence, cigarettes, uncomplicated: Secondary | ICD-10-CM | POA: Insufficient documentation

## 2017-01-11 DIAGNOSIS — F329 Major depressive disorder, single episode, unspecified: Secondary | ICD-10-CM

## 2017-01-11 DIAGNOSIS — Z801 Family history of malignant neoplasm of trachea, bronchus and lung: Secondary | ICD-10-CM | POA: Diagnosis not present

## 2017-01-11 DIAGNOSIS — Z90710 Acquired absence of both cervix and uterus: Secondary | ICD-10-CM | POA: Diagnosis not present

## 2017-01-11 DIAGNOSIS — Z8249 Family history of ischemic heart disease and other diseases of the circulatory system: Secondary | ICD-10-CM | POA: Diagnosis not present

## 2017-01-11 DIAGNOSIS — M545 Low back pain: Secondary | ICD-10-CM | POA: Insufficient documentation

## 2017-01-11 DIAGNOSIS — R5383 Other fatigue: Secondary | ICD-10-CM | POA: Diagnosis not present

## 2017-01-11 DIAGNOSIS — I5032 Chronic diastolic (congestive) heart failure: Secondary | ICD-10-CM

## 2017-01-11 DIAGNOSIS — Z9851 Tubal ligation status: Secondary | ICD-10-CM | POA: Diagnosis not present

## 2017-01-11 DIAGNOSIS — Z823 Family history of stroke: Secondary | ICD-10-CM | POA: Diagnosis not present

## 2017-01-11 DIAGNOSIS — G4733 Obstructive sleep apnea (adult) (pediatric): Secondary | ICD-10-CM | POA: Insufficient documentation

## 2017-01-11 DIAGNOSIS — I509 Heart failure, unspecified: Secondary | ICD-10-CM | POA: Insufficient documentation

## 2017-01-11 DIAGNOSIS — Z720 Tobacco use: Secondary | ICD-10-CM

## 2017-01-11 DIAGNOSIS — I10 Essential (primary) hypertension: Secondary | ICD-10-CM | POA: Diagnosis not present

## 2017-01-11 DIAGNOSIS — Z79899 Other long term (current) drug therapy: Secondary | ICD-10-CM | POA: Diagnosis not present

## 2017-01-11 DIAGNOSIS — R0602 Shortness of breath: Secondary | ICD-10-CM | POA: Insufficient documentation

## 2017-01-11 NOTE — Patient Instructions (Signed)
[facility] will take walk-ins for initial evaluation M-F 8:30 - 3:30 Address: [address] [place], [place] [address] Phone Number: [phone]

## 2017-01-11 NOTE — Progress Notes (Signed)
Patient ID: Theresa [name], female    DOB: 1955-11-11, 61 y.o.   MRN: [id number]  HPI Theresa [name] is a 61 y/o female with a history of chronic low back pain, HTN, depression, COPD on long-term oxygen, anxiety, carpal tunnel syndrome, long-standing tobacco use and chronic heart failure.  Last echo was done 11/03/15 but unable to access results. Echo done on 02/19/15 showed an EF of 50% without valvular stenosis. Recent PFT's done on 01/18/16  Recently admitted on 10/24/16 due to COPD exacerbation. Initially treated with bipap and then baseline oxygen was resumed. Given IV steroids and discharged on oral prednisone. Was in the ED on 03/07/16 due to shoulder pain after a mechanical fall. Was treated as whiplash and was given mobic.   She presents today for a follow-up visit with fatigue and shortness of breath upon exertion. Symptoms improve after resting. Does wear her oxygen at 2.5 L around the clock along with bipap at bedtime. She reports a stable weight. Continues to have some edema in legs, hands and face. Has been living with her daughter for two days. Is trying to eat low sodium foods. Continues to smoke but wants to quit. Has chronic back pain and is followed by the pain clinic. Fell 3x in a week at the end of January.  Past Medical History:  Diagnosis Date  . Anxiety   . Carpal tunnel syndrome   . CHF (congestive heart failure) ([place])   . CHF (congestive heart failure) ([place])    1/18  . Chronic generalized abdominal pain   . Chronic rhinitis   . COPD (chronic obstructive pulmonary disease) ([place])   . DDD (degenerative disc disease), cervical   . Depression   . Edema   . Flu    1/18  . Gastritis   . Hematuria   . Hypertension   . Kidney stones   . Low back pain   . Lumbar radiculitis   . Malodorous urine   . Muscle weakness   . Obesity   . Renal cyst   . Sensory urge incontinence   . Tobacco abuse    Past Surgical History:  Procedure Laterality Date  . ABDOMINAL HYSTERECTOMY    .  CARPAL TUNNEL RELEASE Left   . TONSILLECTOMY    . TUBAL LIGATION     Family History  Problem Relation Age of Onset  . Heart disease Mother   . Stroke Mother   . Coronary artery disease Mother   . Lung cancer Sister   . Alcohol abuse Father   . Heart disease Father   . Prostate cancer Neg Hx   . Kidney cancer Neg Hx   . Bladder Cancer Neg Hx    Social History  Substance Use Topics  . Smoking status: Current Every Day Smoker    Packs/day: 1.00    Years: 40.00    Types: Cigarettes  . Smokeless tobacco: Never Used     Comment: first day of patch patient states she's trying  . Alcohol use No   Allergies  Allergen Reactions  . Codeine Nausea And Vomiting  . Meloxicam Other (See Comments)    Stomach pain   Prior to Admission medications   Medication Sig Start Date End Date Taking? Authorizing Provider  albuterol (PROVENTIL HFA;VENTOLIN HFA) 108 (90 Base) MCG/ACT inhaler Inhale 2 puffs into the lungs every 6 (six) hours as needed for wheezing. Reported on 11/03/2015 11/02/16  Yes Demetrios Loll, MD  ALPRAZolam Duanne Moron) 0.5 MG tablet Take 0.5 mg by  mouth 2 (two) times daily as needed for anxiety.   Yes Historical Provider, MD  Aspirin-Caffeine (BC FAST PAIN RELIEF ARTHRITIS) 1000-65 MG PACK Take 1 packet by mouth every 6 (six) hours as needed (Pain).   Yes Historical Provider, MD  bisoprolol-hydrochlorothiazide [facility]) 10-6.25 MG tablet TAKE 1 TABLET BY MOUTH DAILY 12/16/16  Yes Vickki Muff Chrismon, PA  Cholecalciferol (CVS D3) 2000 units CAPS Take 1 capsule by mouth daily.   Yes Historical Provider, MD  Fluticasone-Salmeterol (ADVAIR DISKUS) 250-50 MCG/DOSE AEPB INHALE 1 PUFF INTO LUNGS TWICE A DAY 05/23/16  Yes Historical Provider, MD  furosemide (LASIX) 40 MG tablet Take 1 tablet (40 mg total) by mouth daily. 08/29/16  Yes [name], FNP  HYDROcodone-acetaminophen (NORCO/VICODIN) 5-325 MG tablet Take 1 tablet by mouth every 8 (eight) hours as needed for severe pain. 12/17/16 01/16/17 Yes  Milinda Pointer, MD  HYDROcodone-acetaminophen (NORCO/VICODIN) 5-325 MG tablet Take 1 tablet by mouth every 8 (eight) hours as needed for severe pain. 01/16/17 02/15/17 Yes Milinda Pointer, MD  ipratropium-albuterol (DUONEB) 0.5-2.5 (3) MG/3ML SOLN Take 3 mLs by nebulization every 6 (six) hours as needed (shortness of breath). 11/02/16  Yes Demetrios Loll, MD  Menthol, Topical Analgesic, (BENGAY COLD THERAPY) 5 % GEL Apply 1 application topically daily.   Yes Historical Provider, MD  OXYGEN Inhale 2.5 L into the lungs continuous. Reported on 11/03/2015   Yes Historical Provider, MD  potassium chloride SA (K-DUR,KLOR-CON) 20 MEQ tablet Take 1 tablet (20 mEq total) by mouth daily. 09/16/16  Yes [name], FNP  ranitidine (ZANTAC) 150 MG tablet Take 150 mg by mouth 2 (two) times daily.   Yes Historical Provider, MD  roflumilast (DALIRESP) 500 MCG TABS tablet Take 500 mcg by mouth daily.   Yes Historical Provider, MD  sertraline (ZOLOFT) 100 MG tablet TAKE 1 TABLET (100 MG TOTAL) BY MOUTH DAILY. 08/29/16  Yes Birdie Sons, MD  ALPRAZolam Duanne Moron) 0.5 MG tablet Take 1 tablet (0.5 mg total) by mouth 2 (two) times daily as needed for anxiety. Patient not taking: Reported on 01/11/2017 12/09/16   [name], FNP  HYDROcodone-acetaminophen (NORCO/VICODIN) 5-325 MG tablet Take 1 tablet by mouth every 8 (eight) hours as needed for severe pain. 11/17/16 12/17/16  Milinda Pointer, MD  tamsulosin (FLOMAX) 0.4 MG CAPS capsule Take 1 capsule (0.4 mg total) by mouth daily. Patient not taking: Reported on 12/23/2016 11/28/16   Nori Riis, PA-C    Review of Systems  Constitutional: Positive for fatigue. Negative for appetite change.  HENT: Negative for congestion and postnasal drip.   Eyes: Negative.   Respiratory: Positive for cough and shortness of breath. Negative for chest tightness.   Cardiovascular: Positive for chest pain.       Couple time a week  Gastrointestinal: Negative.   Endocrine: Negative.    Genitourinary: Negative.   Musculoskeletal: Positive for back pain. Negative for neck pain.       Chronic back pain  Skin: Negative.   Allergic/Immunologic: Negative.   Neurological: Positive for weakness, light-headedness and headaches.       Headaches with CP sometimes; feels like arms are getting weak  Hematological: Does not bruise/bleed easily.  Psychiatric/Behavioral: Positive for dysphoric mood. Negative for sleep disturbance and suicidal ideas. The patient is not nervous/anxious.        Sleeping in recliner, can't lay flat    Physical Exam  Constitutional: She is oriented to person, place, and time. She appears well-developed and well-nourished.  HENT:  Head: Normocephalic and atraumatic.  Eyes: Conjunctivae are normal. Pupils are equal, round, and reactive to light.  Neck: Normal range of motion. Neck supple. No JVD present.  Cardiovascular: Normal rate and regular rhythm.   Pulmonary/Chest: Effort normal. She has no wheezes. She has no rales.  Abdominal: Soft. She exhibits no distension. There is no tenderness.  Musculoskeletal: She exhibits edema (trace edema in bilateral lower legs). She exhibits no tenderness.  Neurological: She is alert and oriented to person, place, and time.  Skin: Skin is warm and dry.  Psychiatric: She has a normal mood and affect. Her behavior is normal. Thought content normal.  Nursing note and vitals reviewed.   Vitals:   01/11/17 1245  BP: 109/65  Pulse: 80  Resp: 18  SpO2: 96%  Weight: 255 lb 8 oz (115.9 kg)  Height: _0  (1.6 m)   Wt Readings from Last 3 Encounters:  01/11/17 255 lb 8 oz (115.9 kg)  12/23/16 258 lb 6 oz (117.2 kg)  12/01/16 263 lb 9.6 oz (119.6 kg)   Lab Results  Component Value Date   CREATININE 0.74 11/01/2016   CREATININE 0.60 10/31/2016   CREATININE 0.56 10/28/2016       Assessment & Plan:  1: Chronic heart failure with preserved ejection fraction- - NYHA class III - continues to be mildly volume  overloaded today - weight is down 10 pounds since last here  - continues to take 7m furosemide daily along with 249m potassium daily - monitor sodium content carefully; uses Theresa dash/salt free, reading labels, encouraged < 2000 mg sodium intake - 1 cup coffee daily and two 20 oz bottles of water a day; sometimes 1 glass of ice tea without sugar or 1 can diet mtn dew; encouraged 40-50 oz /day - Was given scale today to weigh daily and instructed to call for an overnight weight gain of >2 pounds or a weekly weight gain of >5 pounds - Using walker today, was using wheelchair at last visit - saw cardiologist (CClayborn Bigness10/16/17. Next appointment around April 2018 - Needs ECHO  2: HTN- - BP looks good today - Has seen PCP (Dr. DeSharla Kidneyhrismon) twice since recent discharge   3: Obstructive sleep apnea- - wearing bipap nightly - oxygen at 2.5L around the clock - last saw pulmonology (FRaul Delaround in January and has appt scheduled  4: Tobacco use- - smoking a little more than last time she was here, up to a pack every 3 days from 2 cigarettes a day - interested in quitting smoking but daughter and husband both smoke - Going outside to smoke while living with daughter - Discussed NRT (patches, gum) - removes herself from the oxygen when smoking - complete cessation discussed for 5 minutes with her  5: Reactive depression- - pain management advised her that she needed to see psychiatry - we recommend that she visit [facility] for psych services - Has been on sertraline 100 mg for a couple of years, also taking alprazolam 0.5 bid prn  Patient did not bring her medications nor a list. Each medication was verbally reviewed with the patient and she was encouraged to bring the bottles to every visit to confirm accuracy of list.  Return here in 2 months or sooner for any questions/problems before then  HoDarrow BussingPharmD Pharmacy Resident 01/11/2017 3:08 PM

## 2017-01-12 NOTE — Addendum Note (Signed)
Addended by: Gaylord Shih on: 01/12/2017 04:36 PM   Modules accepted: Orders

## 2017-01-24 ENCOUNTER — Telehealth: Payer: Self-pay

## 2017-01-25 ENCOUNTER — Ambulatory Visit: Payer: Medicare PPO | Admitting: Pain Medicine

## 2017-01-31 ENCOUNTER — Telehealth: Payer: Self-pay | Admitting: Family

## 2017-01-31 MED ORDER — ALPRAZOLAM 0.5 MG PO TABS: 0.5000 mg | ORAL_TABLET | Freq: Every evening | ORAL | 0 refills | Status: DC | PRN

## 2017-01-31 NOTE — Telephone Encounter (Signed)
Patient called to ask if I would refill her alprazolam. She said that her 60 y/o daughter recently and suddenly died about a week ago. She says that she hasn't been able to get to [facility] for an evaluation. She adamantly denies any suicidal thoughts/plans. Autopsy is pending on her daughter.   Advised her to call her PCP to update on what's happened and that I would refill her alprazolam to get her through this difficult time. Encouraged her to get to [facility] and reminded her that she doesn't have to go through this alone.

## 2017-02-02 ENCOUNTER — Ambulatory Visit (INDEPENDENT_AMBULATORY_CARE_PROVIDER_SITE_OTHER): Payer: Medicare PPO | Admitting: Family Medicine

## 2017-02-02 ENCOUNTER — Encounter: Payer: Self-pay | Admitting: Family Medicine

## 2017-02-02 VITALS — BP 108/68 | HR 83 | Temp 98.8°F | Wt 258.0 lb

## 2017-02-02 DIAGNOSIS — H0015 Chalazion left lower eyelid: Secondary | ICD-10-CM | POA: Diagnosis not present

## 2017-02-02 DIAGNOSIS — F329 Major depressive disorder, single episode, unspecified: Secondary | ICD-10-CM | POA: Diagnosis not present

## 2017-02-02 DIAGNOSIS — Z634 Disappearance and death of family member: Secondary | ICD-10-CM

## 2017-02-02 DIAGNOSIS — F4320 Adjustment disorder, unspecified: Secondary | ICD-10-CM

## 2017-02-02 MED ORDER — ERYTHROMYCIN 5 MG/GM OP OINT: 1.0000 "application " | TOPICAL_OINTMENT | Freq: Three times a day (TID) | OPHTHALMIC | 0 refills | Status: DC

## 2017-02-02 NOTE — Patient Instructions (Signed)
Chalazion A chalazion is a swelling or lump on the eyelid. It can affect the upper or lower eyelid. What are the causes? This condition may be caused by:  Long-lasting (chronic) inflammation of the eyelid glands.  A blocked oil gland in the eyelid. What are the signs or symptoms? Symptoms of this condition include:  A swelling on the eyelid. The swelling may spread to areas around the eye.  A hard lump on the eyelid. This lump may make it hard to see out of the eye. How is this diagnosed? This condition is diagnosed with an examination of the eye. How is this treated? This condition is treated by applying a warm compress to the eyelid. If the condition does not improve after two days, it may be treated with:  Surgery.  Medicine that is injected into the chalazion by a health care provider.  Medicine that is applied to the eye. Follow these instructions at home:  Do not touch the chalazion.  Do not try to remove the pus, such as by squeezing the chalazion or sticking it with a pin or needle.  Do not rub your eyes.  Wash your hands often. Dry your hands with a clean towel.  Keep your face, scalp, and eyebrows clean.  Avoid wearing eye makeup.  Apply a warm, moist compress to the eyelid 4-6 times a day for 10-15 minutes at a time. This will help to open any blocked glands and help to reduce redness and swelling.  Apply over-the-counter and prescription medicines only as told by your health care provider.  If the chalazion does not break open (rupture) on its own in a month, return to your health care provider.  Keep all follow-up appointments as told by your health care provider. This is important. Contact a health care provider if:  Your eyelid has not improved in 4 weeks.  Your eyelid is getting worse.  You have a fever.  The chalazion does not rupture on its own with home treatment in a month. Get help right away if:  You have pain in your eye.  Your vision  changes.  The chalazion becomes painful or red  The chalazion gets bigger. This information is not intended to replace advice given to you by your health care provider. Make sure you discuss any questions you have with your health care provider. Document Released: 09/30/2000 Document Revised: 03/10/2016 Document Reviewed: 01/26/2015 Elsevier Interactive Patient Education  2017 [employer].

## 2017-02-02 NOTE — Progress Notes (Signed)
Patient: Theresa [name] Female    DOB: July 19, 1956   61 y.o.   MRN: [id number] Visit Date: 02/02/2017  Today's Provider: [name], PA   Chief Complaint  Patient presents with  . Depression  . Follow-up   Subjective:    Depression         This is a chronic problem.  The problem has been gradually worsening since onset.  Associated symptoms include decreased concentration, fatigue, helplessness, hopelessness, insomnia, irritable, restlessness, decreased interest, appetite change, body aches, headaches and sad.  Associated symptoms include no suicidal ideas.( Crying )     Exacerbated by: daughter's sudden death on Feb 08, 2017.  Treatments tried: Alprazolam 0.42m   Compliance with treatment is poor.  Past Medical History:  Diagnosis Date  . Anxiety   . Carpal tunnel syndrome   . CHF (congestive heart failure) ([facility]   . [place] (cong[place]ive heart failure) ([facility]    1/18  . Chronic generalized abdominal pain   . Chronic rhinitis   . COPD (chronic obstructive pulmonary disease) ([facility]   . DDD (degenerative disc disease), cervical   . Depression   . Edema   . Flu    1/18  . Gastritis   . Hematuria   . Hypertension   . Kidney stones   . Low back pain   . Lumbar radiculitis   . Malodorous urine   . Muscle weakness   . Obesity   . Renal cyst   . Sensory urge incontinence   . Tobacco abuse    Past Surgical History:  Procedure Laterality Date  . ABDOMINAL HYSTERECTOMY    . CARPAL TUNNEL RELEASE Left   . TONSILLECTOMY    . TUBAL LIGATION     Family History  Problem Relation Age of Onset  . Heart disease Mother   . Stroke Mother   . Coronary artery disease Mother   . Lung cancer Sister   . Alcohol abuse Father   . Heart disease Father   . Prostate cancer Neg Hx   . Kidney cancer Neg Hx   . Bladder Cancer Neg Hx    Allergies  Allergen Reactions  . Codeine Nausea And Vomiting  . Meloxicam Other (See Comments)    Stomach pain      Previous Medications   ALBUTEROL  (PROVENTIL HFA;VENTOLIN HFA) 108 (90 BASE) MCG/ACT INHALER    Inhale 2 puffs into the lungs every 6 (six) hours as needed for wheezing. Reported on 11/03/2015   ALPRAZOLAM (XANAX) 0.5 MG TABLET    Take 1 tablet (0.5 mg total) by mouth 2 (two) times daily as needed for anxiety.   ALPRAZOLAM (XANAX) 0.5 MG TABLET    Take 1 tablet (0.5 mg total) by mouth at bedtime as needed for anxiety.   ASPIRIN-CAFFEINE (BC FAST PAIN RELIEF ARTHRITIS) 1000-65 MG PACK    Take 1 packet by mouth every 6 (six) hours as needed (Pain).   BISOPROLOL-HYDROCHLOROTHIAZIDE (ZIAC) 10-6.25 MG TABLET    TAKE 1 TABLET BY MOUTH DAILY   CHOLECALCIFEROL ([facility] D3) 2000 UNITS CAPS    Take 1 capsule by mouth daily.   FLUTICASONE-SALMETEROL (ADVAIR DISKUS) 250-50 MCG/DOSE AEPB    INHALE 1 PUFF INTO LUNGS TWICE A DAY   FUROSEMIDE (LASIX) 40 MG TABLET    Take 1 tablet (40 mg total) by mouth daily.   HYDROCODONE-ACETAMINOPHEN (NORCO/VICODIN) 5-325 MG TABLET    Take 1 tablet by mouth every 8 (eight) hours as needed for severe pain.   HYDROCODONE-ACETAMINOPHEN (NORCO/VICODIN) 5-325 MG  TABLET    Take 1 tablet by mouth every 8 (eight) hours as needed for severe pain.   HYDROCODONE-ACETAMINOPHEN (NORCO/VICODIN) 5-325 MG TABLET    Take 1 tablet by mouth every 8 (eight) hours as needed for severe pain.   IPRATROPIUM-ALBUTEROL (DUONEB) 0.5-2.5 (3) MG/3ML SOLN    Take 3 mLs by nebulization every 6 (six) hours as needed (shortness of breath).   MENTHOL, TOPICAL ANALGESIC, (BENGAY COLD THERAPY) 5 % GEL    Apply 1 application topically daily.   OXYGEN    Inhale 2.5 L into the lungs continuous. Reported on 11/03/2015   POTASSIUM CHLORIDE SA (K-DUR,KLOR-CON) 20 MEQ TABLET    Take 1 tablet (20 mEq total) by mouth daily.   RANITIDINE (ZANTAC) 150 MG TABLET    Take 150 mg by mouth 2 (two) times daily.   ROFLUMILAST (DALIRESP) 500 MCG TABS TABLET    Take 500 mcg by mouth daily.   SERTRALINE (ZOLOFT) 100 MG TABLET    TAKE 1 TABLET (100 MG TOTAL) BY MOUTH DAILY.     TAMSULOSIN (FLOMAX) 0.4 MG CAPS CAPSULE    Take 1 capsule (0.4 mg total) by mouth daily.    Review of Systems  Constitutional: Positive for appetite change and fatigue.  Respiratory: Negative.   Cardiovascular: Negative.   Neurological: Positive for headaches.  Psychiatric/Behavioral: Positive for decreased concentration, depression, dysphoric mood and sleep disturbance. Negative for suicidal ideas. The patient has insomnia.     Social History  Substance Use Topics  . Smoking status: Current Every Day Smoker    Packs/day: 1.00    Years: 40.00    Types: Cigarettes  . Smokeless tobacco: Never Used     Comment: first day of patch patient states she's trying  . Alcohol use No   Objective:   BP 108/68 (BP Location: Right Arm, Patient Position: Sitting, Cuff Size: Large)   Pulse 83   Temp 98.8 F (37.1 C) (Oral)   Wt 258 lb (117 kg)   SpO2 94%   BMI 45.70 kg/m   Physical Exam  Constitutional: She is irritable.  HENT:  Head: Normocephalic.  Right Ear: External ear normal.  Left Ear: External ear normal.  Eyes: EOM are normal.  Puffy and slightly tender left lower eyelid with erythema and chalazion.  Neck: Neck supple.  Cardiovascular: Normal rate and regular rhythm.   Pulmonary/Chest: She is in respiratory distress. She has wheezes.  Abdominal: Soft. Bowel sounds are normal.  Psychiatric: Her speech is normal. Judgment and thought content normal. She is slowed. Cognition and memory are normal. She exhibits a depressed mood.      Assessment & Plan:     1. Bereavement reaction Daughter died unexpectedly on 01/27/17. Had a history of significant cardiovascular disease and autopsy is to be done. Recommend counseling with pastor and may use Alprazolam prn ("don't like to use it much"). Recheck prn.  2. Reactive depression Crying spells and sadness due to bereavement. May increase Zoloft 100 mg to 1.5 tablets daily. Proceed with counseling. Recheck prn. No suicidal  ideation.  3. Chalazion left lower eyelid Onset with swelling and irritation of the left lower eyelid. Chalazion internally. No purulent discharge. Will treat with Erythromycin Ophthalmic Ointment and warm compresses. Recheck prn. - erythromycin ophthalmic ointment; Place 1 application into the left eye 3 (three) times daily. Place 1/2" ribbon of ointment inside of lower eyelid.  Dispense: 3.5 g; Refill: 0

## 2017-02-06 NOTE — Telephone Encounter (Signed)
Unable to reach patient at this time

## 2017-02-07 ENCOUNTER — Ambulatory Visit: Payer: Medicare PPO | Admitting: Urology

## 2017-02-13 ENCOUNTER — Emergency Department: Payer: Medicare PPO

## 2017-02-13 ENCOUNTER — Inpatient Hospital Stay
Admission: EM | Admit: 2017-02-13 | Discharge: 2017-02-16 | DRG: 189 | Disposition: A | Discharge status: Home Health | Payer: Medicare PPO | Attending: Internal Medicine | Admitting: Internal Medicine

## 2017-02-13 DIAGNOSIS — M5030 Other cervical disc degeneration, unspecified cervical region: Secondary | ICD-10-CM | POA: Diagnosis present

## 2017-02-13 DIAGNOSIS — J9602 Acute respiratory failure with hypercapnia: Secondary | ICD-10-CM | POA: Diagnosis present

## 2017-02-13 DIAGNOSIS — G4730 Sleep apnea, unspecified: Secondary | ICD-10-CM | POA: Diagnosis not present

## 2017-02-13 DIAGNOSIS — R51 Headache: Secondary | ICD-10-CM | POA: Diagnosis not present

## 2017-02-13 DIAGNOSIS — J209 Acute bronchitis, unspecified: Secondary | ICD-10-CM | POA: Diagnosis present

## 2017-02-13 DIAGNOSIS — M25511 Pain in right shoulder: Secondary | ICD-10-CM | POA: Diagnosis not present

## 2017-02-13 DIAGNOSIS — R0602 Shortness of breath: Secondary | ICD-10-CM | POA: Diagnosis present

## 2017-02-13 DIAGNOSIS — Z7982 Long term (current) use of aspirin: Secondary | ICD-10-CM | POA: Diagnosis not present

## 2017-02-13 DIAGNOSIS — J9601 Acute respiratory failure with hypoxia: Secondary | ICD-10-CM | POA: Diagnosis present

## 2017-02-13 DIAGNOSIS — F329 Major depressive disorder, single episode, unspecified: Secondary | ICD-10-CM | POA: Diagnosis present

## 2017-02-13 DIAGNOSIS — J9621 Acute and chronic respiratory failure with hypoxia: Secondary | ICD-10-CM | POA: Diagnosis present

## 2017-02-13 DIAGNOSIS — J440 Chronic obstructive pulmonary disease with acute lower respiratory infection: Secondary | ICD-10-CM | POA: Diagnosis present

## 2017-02-13 DIAGNOSIS — Z885 Allergy status to narcotic agent status: Secondary | ICD-10-CM

## 2017-02-13 DIAGNOSIS — J441 Chronic obstructive pulmonary disease with (acute) exacerbation: Secondary | ICD-10-CM | POA: Diagnosis present

## 2017-02-13 DIAGNOSIS — F430 Acute stress reaction: Secondary | ICD-10-CM | POA: Diagnosis present

## 2017-02-13 DIAGNOSIS — I5032 Chronic diastolic (congestive) heart failure: Secondary | ICD-10-CM | POA: Diagnosis present

## 2017-02-13 DIAGNOSIS — D72829 Elevated white blood cell count, unspecified: Secondary | ICD-10-CM | POA: Diagnosis present

## 2017-02-13 DIAGNOSIS — Z79891 Long term (current) use of opiate analgesic: Secondary | ICD-10-CM | POA: Diagnosis not present

## 2017-02-13 DIAGNOSIS — Z79899 Other long term (current) drug therapy: Secondary | ICD-10-CM

## 2017-02-13 DIAGNOSIS — M48061 Spinal stenosis, lumbar region without neurogenic claudication: Secondary | ICD-10-CM | POA: Diagnosis not present

## 2017-02-13 DIAGNOSIS — J310 Chronic rhinitis: Secondary | ICD-10-CM | POA: Diagnosis present

## 2017-02-13 DIAGNOSIS — M6281 Muscle weakness (generalized): Secondary | ICD-10-CM | POA: Diagnosis present

## 2017-02-13 DIAGNOSIS — Z886 Allergy status to analgesic agent status: Secondary | ICD-10-CM

## 2017-02-13 DIAGNOSIS — F4321 Adjustment disorder with depressed mood: Secondary | ICD-10-CM

## 2017-02-13 DIAGNOSIS — R0600 Dyspnea, unspecified: Secondary | ICD-10-CM | POA: Diagnosis not present

## 2017-02-13 DIAGNOSIS — M5116 Intervertebral disc disorders with radiculopathy, lumbar region: Secondary | ICD-10-CM | POA: Diagnosis not present

## 2017-02-13 DIAGNOSIS — M25512 Pain in left shoulder: Secondary | ICD-10-CM | POA: Diagnosis not present

## 2017-02-13 DIAGNOSIS — J9811 Atelectasis: Secondary | ICD-10-CM | POA: Diagnosis present

## 2017-02-13 DIAGNOSIS — G8929 Other chronic pain: Secondary | ICD-10-CM | POA: Diagnosis present

## 2017-02-13 DIAGNOSIS — J449 Chronic obstructive pulmonary disease, unspecified: Secondary | ICD-10-CM | POA: Diagnosis not present

## 2017-02-13 DIAGNOSIS — F419 Anxiety disorder, unspecified: Secondary | ICD-10-CM | POA: Diagnosis present

## 2017-02-13 DIAGNOSIS — Z6841 Body Mass Index (BMI) 40.0 and over, adult: Secondary | ICD-10-CM | POA: Diagnosis not present

## 2017-02-13 DIAGNOSIS — M1610 Unilateral primary osteoarthritis, unspecified hip: Secondary | ICD-10-CM | POA: Diagnosis not present

## 2017-02-13 DIAGNOSIS — M25551 Pain in right hip: Secondary | ICD-10-CM | POA: Diagnosis not present

## 2017-02-13 DIAGNOSIS — F17210 Nicotine dependence, cigarettes, uncomplicated: Secondary | ICD-10-CM | POA: Diagnosis present

## 2017-02-13 DIAGNOSIS — M5416 Radiculopathy, lumbar region: Secondary | ICD-10-CM | POA: Diagnosis present

## 2017-02-13 DIAGNOSIS — Z7951 Long term (current) use of inhaled steroids: Secondary | ICD-10-CM

## 2017-02-13 DIAGNOSIS — I110 Hypertensive heart disease with heart failure: Secondary | ICD-10-CM | POA: Diagnosis present

## 2017-02-13 DIAGNOSIS — F1190 Opioid use, unspecified, uncomplicated: Secondary | ICD-10-CM | POA: Diagnosis not present

## 2017-02-13 DIAGNOSIS — M79605 Pain in left leg: Secondary | ICD-10-CM | POA: Diagnosis not present

## 2017-02-13 DIAGNOSIS — E669 Obesity, unspecified: Secondary | ICD-10-CM | POA: Diagnosis present

## 2017-02-13 DIAGNOSIS — J9600 Acute respiratory failure, unspecified whether with hypoxia or hypercapnia: Secondary | ICD-10-CM

## 2017-02-13 DIAGNOSIS — E559 Vitamin D deficiency, unspecified: Secondary | ICD-10-CM | POA: Diagnosis not present

## 2017-02-13 DIAGNOSIS — M545 Low back pain: Secondary | ICD-10-CM | POA: Diagnosis present

## 2017-02-13 DIAGNOSIS — G894 Chronic pain syndrome: Secondary | ICD-10-CM | POA: Diagnosis present

## 2017-02-13 DIAGNOSIS — Z9981 Dependence on supplemental oxygen: Secondary | ICD-10-CM | POA: Diagnosis not present

## 2017-02-13 DIAGNOSIS — J189 Pneumonia, unspecified organism: Secondary | ICD-10-CM

## 2017-02-13 DIAGNOSIS — M25552 Pain in left hip: Secondary | ICD-10-CM | POA: Diagnosis not present

## 2017-02-13 DIAGNOSIS — F17200 Nicotine dependence, unspecified, uncomplicated: Secondary | ICD-10-CM | POA: Diagnosis not present

## 2017-02-13 DIAGNOSIS — Z634 Disappearance and death of family member: Secondary | ICD-10-CM

## 2017-02-13 DIAGNOSIS — G5603 Carpal tunnel syndrome, bilateral upper limbs: Secondary | ICD-10-CM | POA: Diagnosis not present

## 2017-02-13 DIAGNOSIS — M4696 Unspecified inflammatory spondylopathy, lumbar region: Secondary | ICD-10-CM | POA: Diagnosis not present

## 2017-02-13 DIAGNOSIS — J9622 Acute and chronic respiratory failure with hypercapnia: Secondary | ICD-10-CM | POA: Diagnosis not present

## 2017-02-13 LAB — URINALYSIS, COMPLETE (UACMP) WITH MICROSCOPIC
Bilirubin Urine: NEGATIVE
Glucose, UA: NEGATIVE mg/dL
Ketones, ur: NEGATIVE mg/dL
Leukocytes, UA: NEGATIVE
Nitrite: NEGATIVE
Protein, ur: NEGATIVE mg/dL
Specific Gravity, Urine: 1.005 (ref 1.005–1.030)
pH: 5 (ref 5.0–8.0)

## 2017-02-13 LAB — CBC
HCT: 42.5 % (ref 35.0–47.0)
Hemoglobin: 14 g/dL (ref 12.0–16.0)
MCH: 31.6 pg (ref 26.0–34.0)
MCHC: 33 g/dL (ref 32.0–36.0)
MCV: 95.8 fL (ref 80.0–100.0)
Platelets: 152 10*3/uL (ref 150–440)
RBC: 4.44 MIL/uL (ref 3.80–5.20)
RDW: 15.6 % — ABNORMAL HIGH (ref 11.5–14.5)
WBC: 14.7 10*3/uL — ABNORMAL HIGH (ref 3.6–11.0)

## 2017-02-13 LAB — BASIC METABOLIC PANEL
Anion gap: 8 (ref 5–15)
BUN: 14 mg/dL (ref 6–20)
CO2: 29 mmol/L (ref 22–32)
Calcium: 8.9 mg/dL (ref 8.9–10.3)
Chloride: 107 mmol/L (ref 101–111)
Creatinine, Ser: 0.81 mg/dL (ref 0.44–1.00)
GFR calc Af Amer: >60 mL/min (ref >60)
GFR calc non Af Amer: >60 mL/min (ref >60)
Glucose, Bld: 118 mg/dL — ABNORMAL HIGH (ref 65–99)
Potassium: 3.7 mmol/L (ref 3.5–5.1)
Sodium: 144 mmol/L (ref 135–145)

## 2017-02-13 LAB — GLUCOSE, CAPILLARY: Glucose-Capillary: 139 mg/dL — ABNORMAL HIGH (ref 65–99)

## 2017-02-13 LAB — TROPONIN I: Troponin I: <0.03 ng/mL (ref <0.03)

## 2017-02-13 LAB — LACTIC ACID, PLASMA: Lactic Acid, Venous: 1 mmol/L (ref 0.5–1.9)

## 2017-02-13 LAB — BRAIN NATRIURETIC PEPTIDE: B Natriuretic Peptide: 161 pg/mL — ABNORMAL HIGH (ref 0.0–100.0)

## 2017-02-13 LAB — MRSA PCR SCREENING: MRSA by PCR: NEGATIVE

## 2017-02-13 LAB — MAGNESIUM: Magnesium: 2.1 mg/dL (ref 1.7–2.4)

## 2017-02-13 MED ORDER — SERTRALINE HCL 50 MG PO TABS
100.0000 mg | ORAL_TABLET | Freq: Every day | ORAL | Status: DC
  Administered 2017-02-14 – 2017-02-16 (×3): 100 mg via ORAL
  Filled 2017-02-13 (×3): qty 2

## 2017-02-13 MED ORDER — METHYLPREDNISOLONE SODIUM SUCC 125 MG IJ SOLR
60.0000 mg | Freq: Four times a day (QID) | INTRAMUSCULAR | Status: DC
  Administered 2017-02-13 – 2017-02-14 (×2): 60 mg via INTRAVENOUS
  Filled 2017-02-13 (×2): qty 2

## 2017-02-13 MED ORDER — MOMETASONE FURO-FORMOTEROL FUM 200-5 MCG/ACT IN AERO
2.0000 | INHALATION_SPRAY | Freq: Two times a day (BID) | RESPIRATORY_TRACT | Status: DC
Start: 2017-02-13 — End: 2017-02-14
  Administered 2017-02-13 – 2017-02-14 (×2): 2 via RESPIRATORY_TRACT
  Filled 2017-02-13: qty 8.8

## 2017-02-13 MED ORDER — BISOPROLOL-HYDROCHLOROTHIAZIDE 10-6.25 MG PO TABS
1.0000 | ORAL_TABLET | Freq: Every day | ORAL | Status: DC
  Administered 2017-02-14 – 2017-02-16 (×3): 1 via ORAL
  Filled 2017-02-13 (×4): qty 1

## 2017-02-13 MED ORDER — SENNOSIDES-DOCUSATE SODIUM 8.6-50 MG PO TABS: 1.0000 | ORAL_TABLET | Freq: Every evening | ORAL | Status: DC | PRN

## 2017-02-13 MED ORDER — ENOXAPARIN SODIUM 40 MG/0.4ML ~~LOC~~ SOLN
40.0000 mg | SUBCUTANEOUS | Status: DC
  Administered 2017-02-13: 40 mg via SUBCUTANEOUS
  Filled 2017-02-13: qty 0.4

## 2017-02-13 MED ORDER — SODIUM CHLORIDE 0.9% FLUSH
3.0000 mL | INTRAVENOUS | Status: DC | PRN
  Administered 2017-02-14: 3 mL via INTRAVENOUS
  Filled 2017-02-13: qty 3

## 2017-02-13 MED ORDER — ONDANSETRON HCL 4 MG/2ML IJ SOLN: 4.0000 mg | Freq: Four times a day (QID) | INTRAMUSCULAR | Status: DC | PRN

## 2017-02-13 MED ORDER — ONDANSETRON HCL 4 MG PO TABS: 4.0000 mg | ORAL_TABLET | Freq: Four times a day (QID) | ORAL | Status: DC | PRN

## 2017-02-13 MED ORDER — NICOTINE 14 MG/24HR TD PT24
14.0000 mg | MEDICATED_PATCH | Freq: Every day | TRANSDERMAL | Status: DC
  Administered 2017-02-13 – 2017-02-16 (×4): 14 mg via TRANSDERMAL
  Filled 2017-02-13 (×4): qty 1

## 2017-02-13 MED ORDER — METHYLPREDNISOLONE SODIUM SUCC 125 MG IJ SOLR
125.0000 mg | Freq: Once | INTRAMUSCULAR | Status: AC
Start: 2017-02-13 — End: 2017-02-13
  Administered 2017-02-13: 125 mg via INTRAVENOUS
  Filled 2017-02-13: qty 2

## 2017-02-13 MED ORDER — FUROSEMIDE 10 MG/ML IJ SOLN
40.0000 mg | Freq: Once | INTRAMUSCULAR | Status: AC
  Administered 2017-02-13: 40 mg via INTRAVENOUS
  Filled 2017-02-13: qty 4

## 2017-02-13 MED ORDER — ALPRAZOLAM 0.5 MG PO TABS
0.5000 mg | ORAL_TABLET | Freq: Two times a day (BID) | ORAL | Status: DC | PRN
  Administered 2017-02-13 – 2017-02-15 (×4): 0.5 mg via ORAL
  Filled 2017-02-13 (×4): qty 1

## 2017-02-13 MED ORDER — POTASSIUM CHLORIDE CRYS ER 20 MEQ PO TBCR
20.0000 meq | EXTENDED_RELEASE_TABLET | Freq: Every day | ORAL | Status: DC
  Administered 2017-02-14 – 2017-02-16 (×3): 20 meq via ORAL
  Filled 2017-02-13 (×3): qty 1

## 2017-02-13 MED ORDER — ALBUTEROL SULFATE (2.5 MG/3ML) 0.083% IN NEBU
2.5000 mg | INHALATION_SOLUTION | RESPIRATORY_TRACT | Status: DC | PRN
Start: 2017-02-13 — End: 2017-02-16
  Administered 2017-02-15: 02:00:00 2.5 mg via RESPIRATORY_TRACT
  Filled 2017-02-13: qty 3

## 2017-02-13 MED ORDER — ACETAMINOPHEN 650 MG RE SUPP: 650.0000 mg | Freq: Four times a day (QID) | RECTAL | Status: DC | PRN

## 2017-02-13 MED ORDER — SODIUM CHLORIDE 0.9% FLUSH
3.0000 mL | Freq: Two times a day (BID) | INTRAVENOUS | Status: DC
  Administered 2017-02-13 – 2017-02-15 (×5): 3 mL via INTRAVENOUS

## 2017-02-13 MED ORDER — FUROSEMIDE 40 MG PO TABS
40.0000 mg | ORAL_TABLET | Freq: Every day | ORAL | Status: DC
  Administered 2017-02-14 – 2017-02-15 (×2): 40 mg via ORAL
  Filled 2017-02-13 (×3): qty 1

## 2017-02-13 MED ORDER — ALBUTEROL SULFATE (2.5 MG/3ML) 0.083% IN NEBU
5.0000 mg | INHALATION_SOLUTION | Freq: Once | RESPIRATORY_TRACT | Status: AC
  Administered 2017-02-13: 5 mg via RESPIRATORY_TRACT
  Filled 2017-02-13: qty 6

## 2017-02-13 MED ORDER — AZITHROMYCIN 500 MG PO TABS
500.0000 mg | ORAL_TABLET | Freq: Every day | ORAL | Status: DC
  Administered 2017-02-13: 500 mg via ORAL
  Filled 2017-02-13: qty 1

## 2017-02-13 MED ORDER — HYDROCODONE-ACETAMINOPHEN 5-325 MG PO TABS
1.0000 | ORAL_TABLET | Freq: Three times a day (TID) | ORAL | Status: DC | PRN
  Administered 2017-02-13 – 2017-02-14 (×2): 1 via ORAL
  Filled 2017-02-13 (×2): qty 1

## 2017-02-13 MED ORDER — FAMOTIDINE 20 MG PO TABS
20.0000 mg | ORAL_TABLET | Freq: Two times a day (BID) | ORAL | Status: DC
Start: 2017-02-13 — End: 2017-02-14
  Administered 2017-02-13: 20 mg via ORAL
  Filled 2017-02-13: qty 1

## 2017-02-13 MED ORDER — GUAIFENESIN-DM 100-10 MG/5ML PO SYRP
5.0000 mL | ORAL_SOLUTION | ORAL | Status: DC | PRN
  Administered 2017-02-14 – 2017-02-15 (×3): 5 mL via ORAL
  Filled 2017-02-13 (×3): qty 5

## 2017-02-13 MED ORDER — IPRATROPIUM-ALBUTEROL 0.5-2.5 (3) MG/3ML IN SOLN
3.0000 mL | Freq: Four times a day (QID) | RESPIRATORY_TRACT | Status: DC
  Administered 2017-02-13 – 2017-02-16 (×9): 3 mL via RESPIRATORY_TRACT
  Filled 2017-02-13 (×10): qty 3

## 2017-02-13 MED ORDER — BISACODYL 10 MG RE SUPP: 10.0000 mg | Freq: Every day | RECTAL | Status: DC | PRN

## 2017-02-13 MED ORDER — ACETAMINOPHEN 325 MG PO TABS: 650.0000 mg | ORAL_TABLET | Freq: Four times a day (QID) | ORAL | Status: DC | PRN

## 2017-02-13 MED ORDER — TRAZODONE HCL 50 MG PO TABS
50.0000 mg | ORAL_TABLET | Freq: Once | ORAL | Status: AC
  Administered 2017-02-13: 50 mg via ORAL
  Filled 2017-02-13: qty 1

## 2017-02-13 MED ORDER — ROFLUMILAST 500 MCG PO TABS
500.0000 ug | ORAL_TABLET | Freq: Every day | ORAL | Status: DC
  Administered 2017-02-14 – 2017-02-16 (×3): 500 ug via ORAL
  Filled 2017-02-13 (×4): qty 1

## 2017-02-13 MED ORDER — SODIUM CHLORIDE 0.9 % IV SOLN: 250.0000 mL | INTRAVENOUS | Status: DC | PRN

## 2017-02-13 NOTE — ED Notes (Signed)
RT in room setting up bipap

## 2017-02-13 NOTE — ED Notes (Signed)
Pt arrived via ems for c/o shortness of breath and chest pain - pt ran out of oxygen while driving home and went to the fire dept for assistance - she had O2 sat of 70% and ems was called - lung sounds are "wet" in all lung fields and audible gurgling heard - Dr Burlene Arnt at bedside to eval pt

## 2017-02-13 NOTE — ED Notes (Signed)
Pt assisted on bedpan to void

## 2017-02-13 NOTE — H&P (Addendum)
[facility] [place] NAME: Theresa [name]    MR#:  [id number]  DATE OF BIRTH:  Sep 18, 1956  DATE OF ADMISSION:  02/13/2017  PRIMARY CARE PHYSICIAN: [name], PA   REQUESTING/REFERRING PHYSICIAN: Schuyler Amor, MD  CHIEF COMPLAINT:   Chief Complaint  Patient presents with  . Shortness of Breath  . Chest Pain   Shortness breath, cough and wheezing, and chest tightness today. HISTORY OF PRESENT ILLNESS:  Theresa [name]  is a 61 y.o. female with a known history of COPD,chronic respiratory failure on home oxygen 2 L, CHF, anxiety, hypertension and obesity. The patient was sent to ED due to above chief complaints. The patient is very anxious and upset today because her daughter died a few days ago. She started to have cough, wheezingand shortness of breath today, which has been worsening. She also complains of chest tightness, but denies any orthopnea, nocturnal dyspnea or leg edema. She has been using recliner to sleep for the past 5 years. She was found hypoxia with O2 saturation at 70s, put on BiPAP by ED physician. ED physician tried to take off BiPAP, but the patient is hypoxic and had to put BiPAP again.chest x-ray didn't show any pulmonary edema or infiltrate. PAST MEDICAL HISTORY:   Past Medical History:  Diagnosis Date  . Anxiety   . Carpal tunnel syndrome   . CHF (congestive heart failure) ([place])   . CHF (congestive heart failure) ([place])    1/18  . Chronic generalized abdominal pain   . Chronic rhinitis   . COPD (chronic obstructive pulmonary disease) ([place])   . DDD (degenerative disc disease), cervical   . Depression   . Edema   . Flu    1/18  . Gastritis   . Hematuria   . Hypertension   . Kidney stones   . Low back pain   . Lumbar radiculitis   . Malodorous urine   . Muscle weakness   . Obesity   . Renal cyst   . Sensory urge incontinence   . Tobacco abuse     PAST SURGICAL HISTORY:   Past Surgical History:    Procedure Laterality Date  . ABDOMINAL HYSTERECTOMY    . CARPAL TUNNEL RELEASE Left   . TONSILLECTOMY    . TUBAL LIGATION      SOCIAL HISTORY:   Social History  Substance Use Topics  . Smoking status: Current Every Day Smoker    Packs/day: 1.00    Years: 40.00    Types: Cigarettes  . Smokeless tobacco: Never Used     Comment: first day of patch patient states she's trying  . Alcohol use No    FAMILY HISTORY:   Family History  Problem Relation Age of Onset  . Heart disease Mother   . Stroke Mother   . Coronary artery disease Mother   . Lung cancer Sister   . Alcohol abuse Father   . Heart disease Father   . Prostate cancer Neg Hx   . Kidney cancer Neg Hx   . Bladder Cancer Neg Hx     DRUG ALLERGIES:   Allergies  Allergen Reactions  . Codeine Nausea And Vomiting  . Meloxicam Other (See Comments)    Stomach pain    REVIEW OF SYSTEMS:   Review of Systems  Constitutional: Positive for malaise/fatigue. Negative for chills, diaphoresis, fever and weight loss.  HENT: Negative for congestion.   Eyes: Negative for blurred vision and double  vision.  Respiratory: Positive for cough, sputum production, shortness of breath and wheezing. Negative for hemoptysis and stridor.   Gastrointestinal: Negative for abdominal pain, blood in stool, constipation, diarrhea, melena, nausea and vomiting.  Genitourinary: Negative for dysuria and hematuria.  Musculoskeletal: Negative for back pain.  Skin: Negative for itching and rash.  Neurological: Negative for dizziness, focal weakness, loss of consciousness, weakness and headaches.  Psychiatric/Behavioral: Negative for depression. The patient is not nervous/anxious.     MEDICATIONS AT HOME:   Prior to Admission medications   Medication Sig Start Date End Date Taking? Authorizing Provider  ALPRAZolam Duanne Moron) 0.5 MG tablet Take 1 tablet (0.5 mg total) by mouth 2 (two) times daily as needed for anxiety. 12/09/16  Yes [name],  FNP  bisoprolol-hydrochlorothiazide [facility]) 10-6.25 MG tablet TAKE 1 TABLET BY MOUTH DAILY 12/16/16  Yes Vickki Muff Chrismon, PA  Cholecalciferol ([facility] D3) 2000 units CAPS Take 1 capsule by mouth daily.   Yes Historical Provider, MD  erythromycin ophthalmic ointment Place 1 application into the left eye 3 (three) times daily. Place 1/2" ribbon of ointment inside of lower eyelid. 02/02/17  Yes Dennis E Chrismon, PA  Fluticasone-Salmeterol (ADVAIR DISKUS) 250-50 MCG/DOSE AEPB INHALE 1 PUFF INTO LUNGS TWICE A DAY 05/23/16  Yes Historical Provider, MD  furosemide (LASIX) 40 MG tablet Take 1 tablet (40 mg total) by mouth daily. 08/29/16  Yes [name], FNP  HYDROcodone-acetaminophen (NORCO/VICODIN) 5-325 MG tablet Take 1 tablet by mouth every 8 (eight) hours as needed for severe pain. 01/16/17 02/15/17 Yes Milinda Pointer, MD  OXYGEN Inhale 2.5 L into the lungs continuous. Reported on 11/03/2015   Yes Historical Provider, MD  potassium chloride SA (K-DUR,KLOR-CON) 20 MEQ tablet Take 1 tablet (20 mEq total) by mouth daily. 09/16/16  Yes [name], FNP  ranitidine (ZANTAC) 150 MG tablet Take 150 mg by mouth 2 (two) times daily.   Yes Historical Provider, MD  roflumilast (DALIRESP) 500 MCG TABS tablet Take 500 mcg by mouth daily.   Yes Historical Provider, MD  sertraline (ZOLOFT) 100 MG tablet TAKE 1 TABLET (100 MG TOTAL) BY MOUTH DAILY. 08/29/16  Yes Birdie Sons, MD  albuterol (PROVENTIL HFA;VENTOLIN HFA) 108 (90 Base) MCG/ACT inhaler Inhale 2 puffs into the lungs every 6 (six) hours as needed for wheezing. Reported on 11/03/2015 11/02/16   Demetrios Loll, MD  ALPRAZolam Duanne Moron) 0.5 MG tablet Take 1 tablet (0.5 mg total) by mouth at bedtime as needed for anxiety. 01/31/17   [name], FNP  Aspirin-Caffeine (BC FAST PAIN RELIEF ARTHRITIS) 1000-65 MG PACK Take 1 packet by mouth every 6 (six) hours as needed (Pain).    Historical Provider, MD  HYDROcodone-acetaminophen (NORCO/VICODIN) 5-325 MG tablet Take 1  tablet by mouth every 8 (eight) hours as needed for severe pain. 11/17/16 12/17/16  Milinda Pointer, MD  HYDROcodone-acetaminophen (NORCO/VICODIN) 5-325 MG tablet Take 1 tablet by mouth every 8 (eight) hours as needed for severe pain. 12/17/16 01/16/17  Milinda Pointer, MD  ipratropium-albuterol (DUONEB) 0.5-2.5 (3) MG/3ML SOLN Take 3 mLs by nebulization every 6 (six) hours as needed (shortness of breath). 11/02/16   Demetrios Loll, MD  Menthol, Topical Analgesic, (BENGAY COLD THERAPY) 5 % GEL Apply 1 application topically daily.    Historical Provider, MD  tamsulosin (FLOMAX) 0.4 MG CAPS capsule Take 1 capsule (0.4 mg total) by mouth daily. 11/28/16   Nori Riis, PA-C      VITAL SIGNS:  Blood pressure 112/67, pulse 75, temperature 98.1  F (36.7 C), temperature source Oral, resp. rate (!) 23, height _0  (1.651 m), weight 250 lb (113.4 kg), SpO2 96 %.  PHYSICAL EXAMINATION:  Physical Exam  GENERAL:  61 y.o.-year-old patient lying in the bed with no acute distress. Morbid obese. EYES: Pupils equal, round, reactive to light and accommodation. No scleral icterus. Extraocular muscles intact.  HEENT: Head atraumatic, normocephalic. Oropharynx and nasopharynx clear.  NECK:  Supple, no jugular venous distention. No thyroid enlargement, no tenderness.  LUNGS: bilateral expiratory wheezing,no rales,rhonchi or crepitation. No use of accessory muscles of respiration.  CARDIOVASCULAR: S1, S2 normal. No murmurs, rubs, or gallops.  ABDOMEN: Soft, nontender, nondistended. Bowel sounds present. No organomegaly or mass.  EXTREMITIES: No pedal edema, cyanosis, or clubbing.  NEUROLOGIC: Cranial nerves II through XII are intact. Muscle strength 5/5 in all extremities. Sensation intact. Gait not checked.  PSYCHIATRIC: The patient is alert and oriented x 3.  SKIN: No obvious rash, lesion, or ulcer.   LABORATORY PANEL:   CBC  Recent Labs Lab 02/13/17 1639  WBC 14.7*  HGB 14.0  HCT 42.5  PLT 152    ------------------------------------------------------------------------------------------------------------------  Chemistries   Recent Labs Lab 02/13/17 1639  NA 144  K 3.7  CL 107  CO2 29  GLUCOSE 118*  BUN 14  CREATININE 0.81  CALCIUM 8.9   ------------------------------------------------------------------------------------------------------------------  Cardiac Enzymes  Recent Labs Lab 02/13/17 1639  TROPONINI <0.03   ------------------------------------------------------------------------------------------------------------------  RADIOLOGY:  Dg Chest 1 View  Result Date: 02/13/2017 CLINICAL DATA:  Shortness of breath and chest pain EXAM: CHEST 1 VIEW COMPARISON:  10/28/2016 FINDINGS: Chronic cardiomegaly and pulmonary artery enlargement, best seen at the [facility] and confirmed on January 2018 CT. Mild atelectasis or scarring bases. Streaky opacities on prior have improved. Chronic hyperinflation. No air bronchogram or pneumothorax. IMPRESSION: 1. No acute finding when compared to prior. 2. COPD and mild lung scarring. 3. Chronic cardiomegaly and pulmonary artery enlargement. Electronically Signed   By: Monte Fantasia M.D.   On: 02/13/2017 17:05      IMPRESSION AND PLAN:  Acute on chronic respiratory failure with hypoxia due to COPD exacerbation and acute bronchitis The patient will be admitted to stepdown unit. Continue BiPAP, start IV Solu-Medrol, DuoNeb every 6 hours, Robitussin when necessary, intensivist  Consult. Leukocytosis. Possible due to reaction, follow-up CBC. Hypertension. Continue hypertension medication. Chronic CHF. Unknown type, stable, continue Lasix with  Potassium supplement. TOBACCO ABUSE. sMOKING CESSATION WAS COUNSELED FOR 4 MINUTES, NICOTINE PATCH.  I discussed with the intensivist. All the records are reviewed and case discussed with ED provider. Management plans discussed with the patient, 2 sisters and they are in agreement.  CODE  STATUS: full code  TOTAL CRITICAL TIME TAKING CARE OF THIS PATIENT: 62 minutes.    Demetrios Loll M.D on 02/13/2017 at 6:32 PM  Between 7am to 6pm - Pager - [phone]  After 6pm go to [email or web address] - Proofreader  [facility]  Office  [phone]  CC: Primary care physician; [name], PA   Note: This dictation was prepared with Dragon dictation along with smaller phrase technology. Any transcriptional errors that result from this process are unintentional.

## 2017-02-13 NOTE — ED Provider Notes (Signed)
[facility] Emergency Department Provider Note  ____________________________________________   I have reviewed the triage vital signs and the nursing notes.   HISTORY  Chief Complaint Shortness of Breath and Chest Pain    HPI Theresa [name] is a 61 y.o. female who has a history of multiple medical problems including CHF, COPD on home oxygen, anxiety, COPD, states that she is very anxious upset today because her daughter died a few days ago. She states that she was driving down the road and they were unable to get her oxygen to work this he became acutely short of breath. She states she is also having discomfort in her chest mostly from difficulty breathing. Patient states that she has been compliant with her medications. Patient's axis saturation was 70%. She is not having any chest pain at this time. Patient complaining of ongoing respiratory difficulty which is improved by oxygen.     Past Medical History:  Diagnosis Date  . Anxiety   . Carpal tunnel syndrome   . CHF (congestive heart failure) ([place])   . CHF (congestive heart failure) ([place])    1/18  . Chronic generalized abdominal pain   . Chronic rhinitis   . COPD (chronic obstructive pulmonary disease) ([place])   . DDD (degenerative disc disease), cervical   . Depression   . Edema   . Flu    1/18  . Gastritis   . Hematuria   . Hypertension   . Kidney stones   . Low back pain   . Lumbar radiculitis   . Malodorous urine   . Muscle weakness   . Obesity   . Renal cyst   . Sensory urge incontinence   . Tobacco abuse     Patient Active Problem List   Diagnosis Date Noted  . Lumbar facet hypertrophy (multilevel) 11/17/2016  . Lumbar spinal stenosis (L4-5) 11/17/2016  . 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) 11/17/2016  . 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 Lumbosacral radiculopathy at L4 11/17/2016  . Chronic hip pain (Location of Primary Source of  Pain) (Left) 11/01/2016  . Chronic hip pain (Location of Primary Source of Pain) (Right) 11/01/2016  . Acute respiratory failure ([place]) 10/25/2016  . Supplemental oxygen dependent 08/01/2016  . Sleep apnea 06/02/2016  . Long term current use of opiate analgesic 01/12/2016  . Long term prescription opiate use 01/12/2016  . Opiate use (15 MME/Day) 01/12/2016  . Lumbar facet syndrome (Location of Primary Source of Pain) (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) 01/12/2016  . Chronic sacroiliac joint pain (Bilateral) 01/12/2016  . Vitamin D deficiency 12/30/2015  . Chronic hip pain (Location of Primary Source of Pain) (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) 12/28/2015  . 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 Obesity, Class III, BMI 40-49.9 (morbid obesity) ([place]) 12/28/2015  . 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[place]) 11/19/2015  . Coagulation disorder ([place]) 10/22/2015  . Chronic pain syndrome (significant psychosocial component) 09/21/2015  . 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 09/21/2015  . Encounter for therapeutic drug level monitoring 09/16/2015  . Encounter for chronic pain management 09/16/2015  . Pain management 09/16/2015  . Neurogenic pain 09/16/2015  . Neuropathic pain 09/16/2015  . Musculoskeletal pain 09/16/2015  . Lumbar spondylosis (L4-5) 09/16/2015  . Chronic low back pain (Location of Primary Source of Pain) (Left) 09/16/2015  . Chronic lower extremity pain (Location of Secondary source of pain) (Left) 09/16/2015  . Chronic lumbar radicular pain (Location of Secondary source of pain) (L4 & S1 Dermatome) (Left)  09/16/2015  . Chronic shoulder pain (Right side) 09/16/2015  . Chronic upper extremity pain (Right-sided) 09/16/2015  . Cervical radiculitis (Right side) 09/16/2015  . Abnormal x-ray of lumbar spine 09/16/2015  . Chronic diastolic heart failure ([place]) 07/15/2015  . Chest pain 07/15/2015  . Tobacco abuse 07/15/2015  . Blood clotting disorder ([place]) 04/21/2015  .  Decreased motor strength 07/16/2014  . Clinical depression 07/16/2014  . Chronic rhinitis 07/16/2014  . Carpal tunnel syndrome 07/16/2014  . Anxiety state 07/16/2014  . Neuritis or radiculitis due to rupture of lumbar intervertebral disc 07/02/2014  . DDD (degenerative disc disease), lumbar 07/02/2014  . CAFL (chronic airflow limitation) ([place]) 02/27/2014  . Chronic obstructive pulmonary disease ([place]) 02/27/2014  . Essential (primary) hypertension 03/27/2008    Past Surgical History:  Procedure Laterality Date  . ABDOMINAL HYSTERECTOMY    . CARPAL TUNNEL RELEASE Left   . TONSILLECTOMY    . TUBAL LIGATION      Prior to Admission medications   Medication Sig Start Date End Date Taking? Authorizing Provider  ALPRAZolam Duanne Moron) 0.5 MG tablet Take 1 tablet (0.5 mg total) by mouth 2 (two) times daily as needed for anxiety. 12/09/16  Yes [name], FNP  bisoprolol-hydrochlorothiazide [facility]) 10-6.25 MG tablet TAKE 1 TABLET BY MOUTH DAILY 12/16/16  Yes Vickki Muff Chrismon, PA  Cholecalciferol ([facility] D3) 2000 units CAPS Take 1 capsule by mouth daily.   Yes Historical Provider, MD  erythromycin ophthalmic ointment Place 1 application into the left eye 3 (three) times daily. Place 1/2" ribbon of ointment inside of lower eyelid. 02/02/17  Yes Dennis E Chrismon, PA  Fluticasone-Salmeterol (ADVAIR DISKUS) 250-50 MCG/DOSE AEPB INHALE 1 PUFF INTO LUNGS TWICE A DAY 05/23/16  Yes Historical Provider, MD  furosemide (LASIX) 40 MG tablet Take 1 tablet (40 mg total) by mouth daily. 08/29/16  Yes [name], FNP  HYDROcodone-acetaminophen (NORCO/VICODIN) 5-325 MG tablet Take 1 tablet by mouth every 8 (eight) hours as needed for severe pain. 01/16/17 02/15/17 Yes Milinda Pointer, MD  OXYGEN Inhale 2.5 L into the lungs continuous. Reported on 11/03/2015   Yes Historical Provider, MD  potassium chloride SA (K-DUR,KLOR-CON) 20 MEQ tablet Take 1 tablet (20 mEq total) by mouth daily. 09/16/16  Yes [name], FNP   ranitidine (ZANTAC) 150 MG tablet Take 150 mg by mouth 2 (two) times daily.   Yes Historical Provider, MD  roflumilast (DALIRESP) 500 MCG TABS tablet Take 500 mcg by mouth daily.   Yes Historical Provider, MD  sertraline (ZOLOFT) 100 MG tablet TAKE 1 TABLET (100 MG TOTAL) BY MOUTH DAILY. 08/29/16  Yes Birdie Sons, MD  albuterol (PROVENTIL HFA;VENTOLIN HFA) 108 (90 Base) MCG/ACT inhaler Inhale 2 puffs into the lungs every 6 (six) hours as needed for wheezing. Reported on 11/03/2015 11/02/16   Demetrios Loll, MD  ALPRAZolam Duanne Moron) 0.5 MG tablet Take 1 tablet (0.5 mg total) by mouth at bedtime as needed for anxiety. 01/31/17   [name], FNP  Aspirin-Caffeine (BC FAST PAIN RELIEF ARTHRITIS) 1000-65 MG PACK Take 1 packet by mouth every 6 (six) hours as needed (Pain).    Historical Provider, MD  HYDROcodone-acetaminophen (NORCO/VICODIN) 5-325 MG tablet Take 1 tablet by mouth every 8 (eight) hours as needed for severe pain. 11/17/16 12/17/16  Milinda Pointer, MD  HYDROcodone-acetaminophen (NORCO/VICODIN) 5-325 MG tablet Take 1 tablet by mouth every 8 (eight) hours as needed for severe pain. 12/17/16 01/16/17  Milinda Pointer, MD  ipratropium-albuterol (DUONEB) 0.5-2.5 (3) MG/3ML SOLN Take  3 mLs by nebulization every 6 (six) hours as needed (shortness of breath). 11/02/16   Demetrios Loll, MD  Menthol, Topical Analgesic, (BENGAY COLD THERAPY) 5 % GEL Apply 1 application topically daily.    Historical Provider, MD  tamsulosin (FLOMAX) 0.4 MG CAPS capsule Take 1 capsule (0.4 mg total) by mouth daily. 11/28/16   Nori Riis, PA-C    Allergies Codeine and Meloxicam  Family History  Problem Relation Age of Onset  . Heart disease Mother   . Stroke Mother   . Coronary artery disease Mother   . Lung cancer Sister   . Alcohol abuse Father   . Heart disease Father   . Prostate cancer Neg Hx   . Kidney cancer Neg Hx   . Bladder Cancer Neg Hx     Social History Social History  Substance Use Topics  .  Smoking status: Current Every Day Smoker    Packs/day: 1.00    Years: 40.00    Types: Cigarettes  . Smokeless tobacco: Never Used     Comment: first day of patch patient states she's trying  . Alcohol use No    Review of Systems Constitutional: No fever/chills Eyes: No visual changes. ENT: No sore throat. No stiff neck no neck pain Cardiovascular: See history of present illness regarding chest pain. Respiratory: See history of present illness regarding shortness of breath. Gastrointestinal:   no vomiting.  No diarrhea.  No constipation. Genitourinary: Negative for dysuria. Musculoskeletal: Negative lower extremity swelling Skin: Negative for rash. Neurological: Negative for severe headaches, focal weakness or numbness. 10-point ROS otherwise negative.  ____________________________________________   PHYSICAL EXAM:  VITAL SIGNS: ED Triage Vitals  Enc Vitals Group     BP 02/13/17 1640 112/67     Pulse Rate 02/13/17 1550 75     Resp 02/13/17 1640 (!) 23     Temp 02/13/17 1640 98.1 F (36.7 C)     Temp Source 02/13/17 1640 Oral     SpO2 02/13/17 1550 96 %     Weight 02/13/17 1638 250 lb (113.4 kg)     Height 02/13/17 1638 _0  (1.651 m)     Head Circumference --      Peak Flow --      Pain Score 02/13/17 1637 5     Pain Loc --      Pain Edu? --      Excl. in [place]? --     Constitutional: Alert and oriented. Well appearing and in no acute distress. Eyes: Conjunctivae are normal. PERRL. EOMI. Head: Atraumatic. Nose: No congestion/rhinnorhea. Mouth/Throat: Mucous membranes are moist.  Oropharynx non-erythematous. Neck: No stridor.   Nontender with no meningismus Cardiovascular: Normal rate, regular rhythm. Grossly normal heart sounds.  Good peripheral circulation. Respiratory:Patient with increased work of breathing, diffuse wheeze in all fields, expiratory more than respiratory, occasional bibasilar rhonchi/rales appreciated. Abdominal: Soft and nontender. No distention.  No guarding no rebound Back:  There is no focal tenderness or step off.  there is no midline tenderness there are no lesions noted. there is no CVA tenderness Musculoskeletal: No lower extremity tenderness, no upper extremity tenderness. No joint effusions, no DVT signs strong distal pulses no edema Neurologic:  Normal speech and language. No gross focal neurologic deficits are appreciated.  Skin:  Skin is warm, dry and intact. No rash noted. Psychiatric: Mood and affect are normal. Speech and behavior are normal.  ____________________________________________   LABS (all labs ordered are listed, but only abnormal results are displayed)  Labs Reviewed  BASIC METABOLIC PANEL - Abnormal; Notable for the following:       Result Value   Glucose, Bld 118 (*)    All other components within normal limits  CBC - Abnormal; Notable for the following:    WBC 14.7 (*)    RDW 15.6 (*)    All other components within normal limits  BRAIN NATRIURETIC PEPTIDE - Abnormal; Notable for the following:    B Natriuretic Peptide 161.0 (*)    All other components within normal limits  CULTURE, BLOOD (ROUTINE X 2)  CULTURE, BLOOD (ROUTINE X 2)  TROPONIN I  LACTIC ACID, PLASMA  URINALYSIS, COMPLETE (UACMP) WITH MICROSCOPIC   ____________________________________________  EKG  I personally interpreted any EKGs ordered by me or triage Sinus rhythm, rate 82 bpm no acute ST elevation, there is a sinus elevation in V1 but that is old, RSR prime noted. ____________________________________________  [id number]  I reviewed any imaging ordered by me or triage that were performed during my shift and, if possible, patient and/or family made aware of any abnormal findings. ____________________________________________   PROCEDURES  Procedure(s) performed: None  Procedures  Critical Care performed: CRITICAL CARE Performed by: Schuyler Amor   Total critical care time: 50 minutes  Critical care time was  exclusive of separately billable procedures and treating other patients.  Critical care was necessary to treat or prevent imminent or life-threatening deterioration.  Critical care was time spent personally by me on the following activities: development of treatment plan with patient and/or surrogate as well as nursing, discussions with consultants, evaluation of patient's response to treatment, examination of patient, obtaining history from patient or surrogate, ordering and performing treatments and interventions, ordering and review of laboratory studies, ordering and review of radiographic studies, pulse oximetry and re-evaluation of patient's condition.   ____________________________________________   INITIAL IMPRESSION / ASSESSMENT AND PLAN / ED COURSE  Pertinent labs & imaging results that were available during my care of the patient were reviewed by me and considered in my medical decision making (see chart for details).  Patient with significant shortness of breath, she got nebulizers and Lasix for me. She was very comfortable BiPAP we just tried a trial off BiPAP and she began again dyspneic and hypoxic. We will restart the BiPAP. Patient will require admission for COPD/CHF. She does not have ongoing chest pain here.    ____________________________________________   FINAL CLINICAL IMPRESSION(S) / ED DIAGNOSES  Final diagnoses:  SOB (shortness of breath)      This chart was dictated using voice recognition software.  Despite best efforts to proofread,  errors can occur which can change meaning.      Schuyler Amor, MD 02/13/17 [phone]

## 2017-02-13 NOTE — ED Notes (Signed)
Pt family assisted pt on bedpan without asking for help from the nurse and spilled the urine in the bed - linen and pt changed - unable to measure output - explained to family the importance of calling for the nurse

## 2017-02-13 NOTE — ED Notes (Addendum)
Per Dr Burlene Arnt pt removed from cpap to test O2 sat - within 5 minutes on 2L O2 via n/c pt desat'd to 84% - increased O2 to 4L via n/c O2 sat 90% - RT called to place pt back on cpap - Dr Burlene Arnt notified

## 2017-02-13 NOTE — ED Triage Notes (Signed)
Pt arrived via ems for c/o shortness of breath and chest pain - pt ran out of oxygen while driving home and went to the fire dept for assistance - she had O2 sat of 70% and ems was called - lung sounds are "wet" in all lung fields and audible gurgling heard - Dr Burlene Arnt at bedside to eval pt

## 2017-02-13 NOTE — Consult Note (Signed)
Name: Theresa [name] MRN: [id number] DOB: Oct 19, 1955    ADMISSION DATE:  02/13/2017 CONSULTATION DATE: 02/13/2017  REFERRING MD : Dr. Bridgett Larsson  CHIEF COMPLAINT: Shortness of Breath and Chest Pain  BRIEF PATIENT DESCRIPTION:  61 yo female admitted 04/30 with acute on chronic hypoxic respiratory failure secondary to AECOPD with bronchitis  SIGNIFICANT EVENTS  04/30-Pt admitted to [facility]   STUDIES:  None  HISTORY OF PRESENT ILLNESS:   This is a 61 yo female with a PMH of Tobacco abuse, Urge incontinence, Renal cyst, Obesity, Lumbar radiculitis, Lower back pain, Kidney stones, HTN, Hematuria, Gastritis, Influenza, Depression, Edema, COPD, Chronic rhinitis, Chronic generalized abdominal pain, CHF, and Anxiety.  She presented to [facility] ER 04/30 with c/o shortness of breath, wheezing, productive cough, and chest discomfort.  The pt has been anxious and upset because her daughter died 3 weeks ago and she feels her depression has worsened. Today 04/30 she was driving down the road and developed shortness of breath, however she could not get her oxygen to work.  Upon arrival to the ER her O2 sats were in the 70's and pt was placed on continuous Bipap.  She was subsequently admitted to the [facility] by hospitalist for further workup and management.    PAST MEDICAL HISTORY :   has a past medical history of Anxiety; Carpal tunnel syndrome; CHF (congestive heart failure) ([place]); CHF (congestive heart failure) ([place]); Chronic generalized abdominal pain; Chronic rhinitis; COPD (chronic obstructive pulmonary disease) (HCC); DDD (degenerative disc disease), cervical; Depression; Edema; Flu; Gastritis; Hematuria; Hypertension; Kidney stones; Low back pain; Lumbar radiculitis; Malodorous urine; Muscle weakness; Obesity; Renal cyst; Sensory urge incontinence; and Tobacco abuse.  has a past surgical history that includes Tubal ligation; Abdominal hysterectomy; Tonsillectomy; and Carpal tunnel release  (Left). Prior to Admission medications   Medication Sig Start Date End Date Taking? Authorizing Provider  ALPRAZolam Duanne Moron) 0.5 MG tablet Take 1 tablet (0.5 mg total) by mouth 2 (two) times daily as needed for anxiety. 12/09/16  Yes [name], FNP  bisoprolol-hydrochlorothiazide [facility]) 10-6.25 MG tablet TAKE 1 TABLET BY MOUTH DAILY 12/16/16  Yes Vickki Muff Chrismon, PA  Cholecalciferol ([facility] D3) 2000 units CAPS Take 1 capsule by mouth daily.   Yes Historical Provider, MD  erythromycin ophthalmic ointment Place 1 application into the left eye 3 (three) times daily. Place 1/2" ribbon of ointment inside of lower eyelid. 02/02/17  Yes Dennis E Chrismon, PA  Fluticasone-Salmeterol (ADVAIR DISKUS) 250-50 MCG/DOSE AEPB INHALE 1 PUFF INTO LUNGS TWICE A DAY 05/23/16  Yes Historical Provider, MD  furosemide (LASIX) 40 MG tablet Take 1 tablet (40 mg total) by mouth daily. 08/29/16  Yes [name], FNP  HYDROcodone-acetaminophen (NORCO/VICODIN) 5-325 MG tablet Take 1 tablet by mouth every 8 (eight) hours as needed for severe pain. 01/16/17 02/15/17 Yes Milinda Pointer, MD  OXYGEN Inhale 2.5 L into the lungs continuous. Reported on 11/03/2015   Yes Historical Provider, MD  potassium chloride SA (K-DUR,KLOR-CON) 20 MEQ tablet Take 1 tablet (20 mEq total) by mouth daily. 09/16/16  Yes [name], FNP  ranitidine (ZANTAC) 150 MG tablet Take 150 mg by mouth 2 (two) times daily.   Yes Historical Provider, MD  roflumilast (DALIRESP) 500 MCG TABS tablet Take 500 mcg by mouth daily.   Yes Historical Provider, MD  sertraline (ZOLOFT) 100 MG tablet TAKE 1 TABLET (100 MG TOTAL) BY MOUTH DAILY. 08/29/16  Yes Birdie Sons, MD  albuterol (PROVENTIL HFA;VENTOLIN HFA) 108 [phone] Base) MCG/ACT  inhaler Inhale 2 puffs into the lungs every 6 (six) hours as needed for wheezing. Reported on 11/03/2015 11/02/16   Demetrios Loll, MD  ALPRAZolam Duanne Moron) 0.5 MG tablet Take 1 tablet (0.5 mg total) by mouth at bedtime as needed for anxiety.  01/31/17   [name], FNP  Aspirin-Caffeine (BC FAST PAIN RELIEF ARTHRITIS) 1000-65 MG PACK Take 1 packet by mouth every 6 (six) hours as needed (Pain).    Historical Provider, MD  HYDROcodone-acetaminophen (NORCO/VICODIN) 5-325 MG tablet Take 1 tablet by mouth every 8 (eight) hours as needed for severe pain. 11/17/16 12/17/16  Milinda Pointer, MD  HYDROcodone-acetaminophen (NORCO/VICODIN) 5-325 MG tablet Take 1 tablet by mouth every 8 (eight) hours as needed for severe pain. 12/17/16 01/16/17  Milinda Pointer, MD  ipratropium-albuterol (DUONEB) 0.5-2.5 (3) MG/3ML SOLN Take 3 mLs by nebulization every 6 (six) hours as needed (shortness of breath). 11/02/16   Demetrios Loll, MD  Menthol, Topical Analgesic, (BENGAY COLD THERAPY) 5 % GEL Apply 1 application topically daily.    Historical Provider, MD  tamsulosin (FLOMAX) 0.4 MG CAPS capsule Take 1 capsule (0.4 mg total) by mouth daily. 11/28/16   Nori Riis, PA-C   Allergies  Allergen Reactions  . Codeine Nausea And Vomiting  . Meloxicam Other (See Comments)    Stomach pain    FAMILY HISTORY:  family history includes Alcohol abuse in her father; Coronary artery disease in her mother; Heart disease in her father and mother; Lung cancer in her sister; Stroke in her mother. SOCIAL HISTORY:  reports that she has been smoking Cigarettes.  She has a 40.00 pack-year smoking history. She has never used smokeless tobacco. She reports that she does not drink alcohol or use drugs.  REVIEW OF SYSTEMS:   Constitutional: Negative for fever, chills, weight loss, malaise/fatigue and diaphoresis.  HENT: Negative for hearing loss, ear pain, nosebleeds, congestion, sore throat, neck pain, tinnitus and ear discharge.   Eyes: Negative for blurred vision, double vision, photophobia, pain, discharge and redness.  Respiratory: Negative for cough, hemoptysis, sputum production, shortness of breath, wheezing and stridor.   Cardiovascular: Negative for chest pain,  palpitations, orthopnea, claudication, leg swelling and PND.  Gastrointestinal: Negative for heartburn, nausea, vomiting, abdominal pain, diarrhea, constipation, blood in stool and melena.  Genitourinary: Negative for dysuria, urgency, frequency, hematuria and flank pain.  Musculoskeletal: Negative for myalgias, back pain, joint pain and falls.  Skin: Negative for itching and rash.  Neurological: Negative for dizziness, tingling, tremors, sensory change, speech change, focal weakness, seizures, loss of consciousness, weakness and headaches.  Endo/Heme/Allergies: Negative for environmental allergies and polydipsia. Does not bruise/bleed easily.  SUBJECTIVE:  States her breathing has improved since arrival to the hospital.  She states over the past few weeks she has been having problems with sleeping throughout the night.  VITAL SIGNS: Temp:  [98.1 F (36.7 C)-98.9 F (37.2 C)] 98.9 F (37.2 C) (04/30 2100) Pulse Rate:  [74-81] 74 (04/30 2100) Resp:  [23-32] 32 (04/30 2100) BP: (104-125)/(65-78) 120/78 (04/30 2100) SpO2:  [91 %-97 %] 95 % (04/30 2100) FiO2 (%):  [40 %] 40 % (04/30 2100) Weight:  [113.4 kg (250 lb)] 113.4 kg (250 lb) (04/30 1638)  PHYSICAL EXAMINATION: General: well developed, well nourished, obese Caucasian female, no NAD Neuro: alert and oriented, follows commands  HEENT: supple, no JVD Cardiovascular: s1s2, rrr, no M/R/G Lungs: rhonchi throughout no wheezes, even, non labored on Bipap Abdomen: +BS x4, obese, soft, non tender, non distended  Musculoskeletal: normal bulk  and tone  Skin: intact rashes or lesions    Recent Labs Lab 02/13/17 1639  NA 144  K 3.7  CL 107  CO2 29  BUN 14  CREATININE 0.81  GLUCOSE 118*    Recent Labs Lab 02/13/17 1639  HGB 14.0  HCT 42.5  WBC 14.7*  PLT 152   Dg Chest 1 View  Result Date: 02/13/2017 CLINICAL DATA:  Shortness of breath and chest pain EXAM: CHEST 1 VIEW COMPARISON:  10/28/2016 FINDINGS: Chronic  cardiomegaly and pulmonary artery enlargement, best seen at the [facility] and confirmed on January 2018 CT. Mild atelectasis or scarring bases. Streaky opacities on prior have improved. Chronic hyperinflation. No air bronchogram or pneumothorax. IMPRESSION: 1. No acute finding when compared to prior. 2. COPD and mild lung scarring. 3. Chronic cardiomegaly and pulmonary artery enlargement. Electronically Signed   By: Monte Fantasia M.D.   On: 02/13/2017 17:05    ASSESSMENT / PLAN: Acute on chronic hypoxic respiratory failure secondary to AECOPD and acute bronchitis   Anxiety Depression  Hx: Chronic CHF, Obesity, Tobacco abuse, and Influenza P: Continuous Bipap wean as tolerated or supplemental O2 for dyspnea or maintain O2 sats  Maintain O2 sats 88% to 92% Continue scheduled and prn bronchodilator therapy  Continue iv steroids  Intermittent CXR Continue azithromycin  Follow cultures  Continue zoloft and xanax Continuous telemetry monitoring  Lovenox for VTE prophylaxis  Monitor for s/sx of bleeding Psych consulted  Smoking cessation counseling provided   Marda Stalker, [place] Pager [phone] (please enter 7 digits) PCCM Consult Pager [phone] (please enter 7 digits)  Merton Border, MD PCCM service Mobile [phone] Pager [phone] 02/14/2017 12:00 PM

## 2017-02-14 ENCOUNTER — Inpatient Hospital Stay: Payer: Medicare PPO

## 2017-02-14 DIAGNOSIS — J9621 Acute and chronic respiratory failure with hypoxia: Principal | ICD-10-CM

## 2017-02-14 DIAGNOSIS — F4321 Adjustment disorder with depressed mood: Secondary | ICD-10-CM

## 2017-02-14 DIAGNOSIS — F17200 Nicotine dependence, unspecified, uncomplicated: Secondary | ICD-10-CM

## 2017-02-14 DIAGNOSIS — J441 Chronic obstructive pulmonary disease with (acute) exacerbation: Secondary | ICD-10-CM

## 2017-02-14 DIAGNOSIS — Z634 Disappearance and death of family member: Secondary | ICD-10-CM

## 2017-02-14 DIAGNOSIS — J9622 Acute and chronic respiratory failure with hypercapnia: Secondary | ICD-10-CM

## 2017-02-14 LAB — CBC
HCT: 43.6 % (ref 35.0–47.0)
Hemoglobin: 14 g/dL (ref 12.0–16.0)
MCH: 31.5 pg (ref 26.0–34.0)
MCHC: 32.1 g/dL (ref 32.0–36.0)
MCV: 98.1 fL (ref 80.0–100.0)
Platelets: 134 10*3/uL — ABNORMAL LOW (ref 150–440)
RBC: 4.45 MIL/uL (ref 3.80–5.20)
RDW: 15.5 % — ABNORMAL HIGH (ref 11.5–14.5)
WBC: 11.5 10*3/uL — ABNORMAL HIGH (ref 3.6–11.0)

## 2017-02-14 LAB — BASIC METABOLIC PANEL
Anion gap: 9 (ref 5–15)
BUN: 19 mg/dL (ref 6–20)
CO2: 32 mmol/L (ref 22–32)
Calcium: 9.1 mg/dL (ref 8.9–10.3)
Chloride: 104 mmol/L (ref 101–111)
Creatinine, Ser: 0.73 mg/dL (ref 0.44–1.00)
GFR calc Af Amer: >60 mL/min (ref >60)
GFR calc non Af Amer: >60 mL/min (ref >60)
Glucose, Bld: 153 mg/dL — ABNORMAL HIGH (ref 65–99)
Potassium: 3.8 mmol/L (ref 3.5–5.1)
Sodium: 145 mmol/L (ref 135–145)

## 2017-02-14 LAB — EXPECTORATED SPUTUM ASSESSMENT W REFEX TO RESP CULTURE

## 2017-02-14 LAB — GLUCOSE, CAPILLARY
Glucose-Capillary: 140 mg/dL — ABNORMAL HIGH (ref 65–99)
Glucose-Capillary: 187 mg/dL — ABNORMAL HIGH (ref 65–99)

## 2017-02-14 IMAGING — US US EXTREM LOW VENOUS*R*
1 series · 13 of 24 positions shown · non-contrast
Comparison: 02/02/2015

CLINICAL DATA: RIGHT leg pain and swelling



[Series 1: us extrem low venous*right* · 0.10mm/px · 13 of 45 slices shown]
[im 1/45]
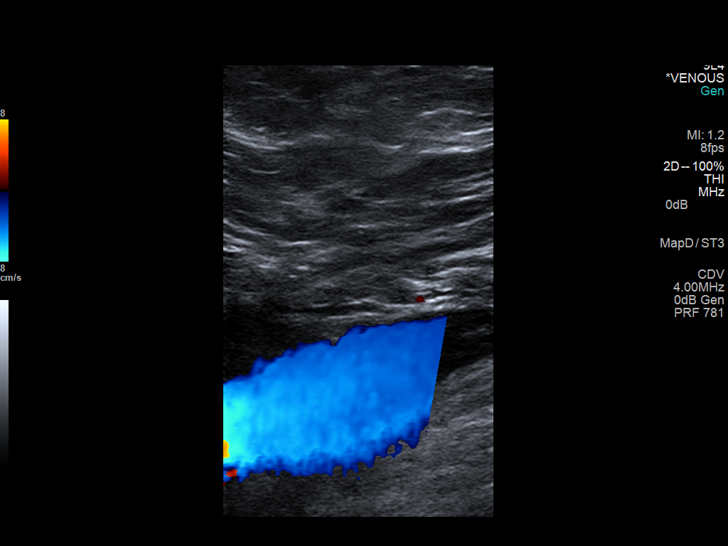
[im 4/45]
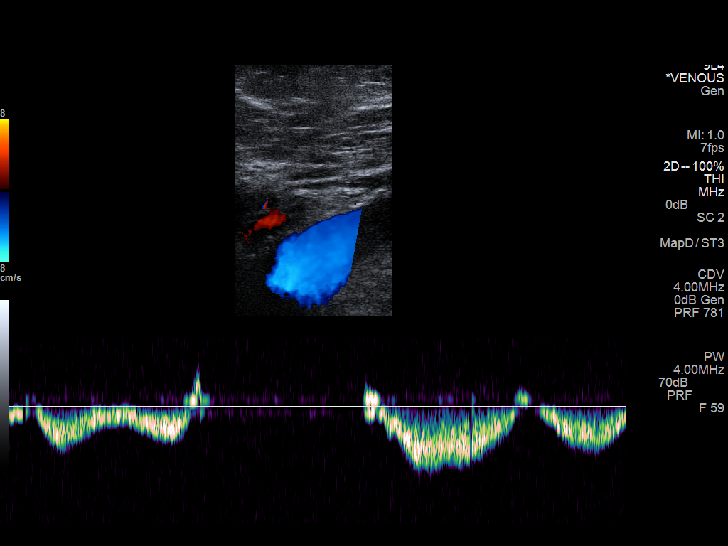
[im 8/45]
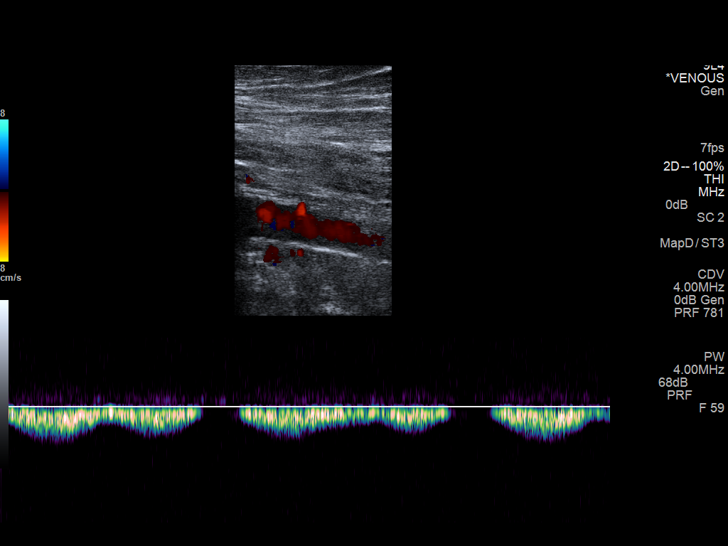
[im 12/45]
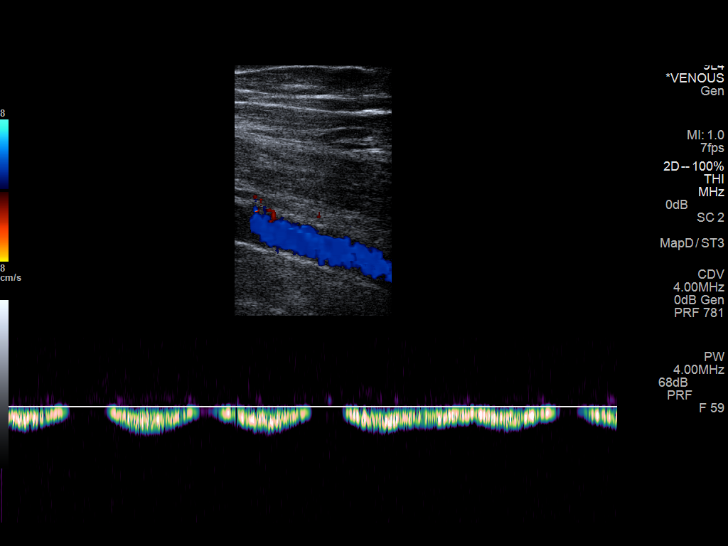
[im 16/45]
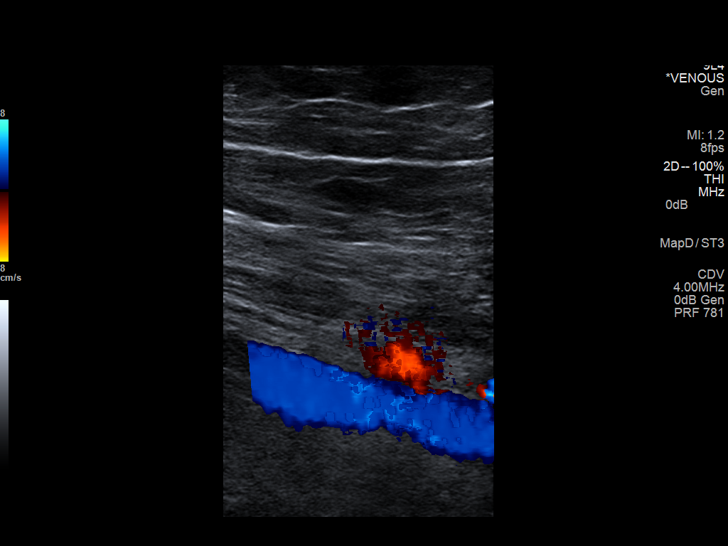
[im 20/45]
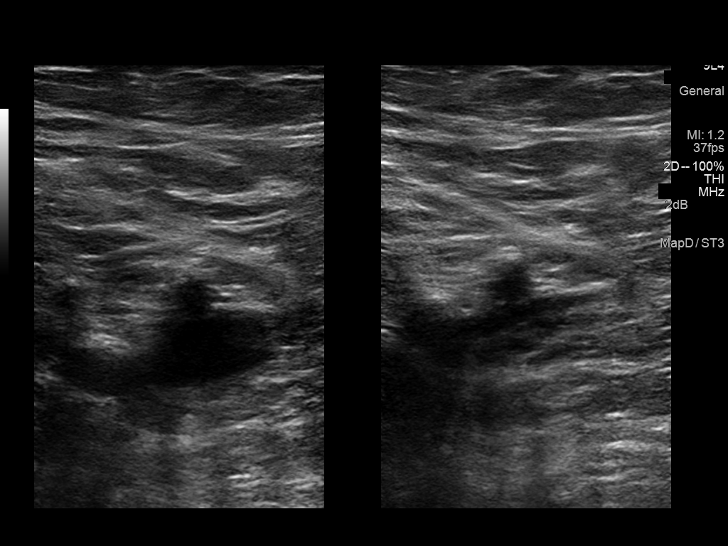
[im 23/45]
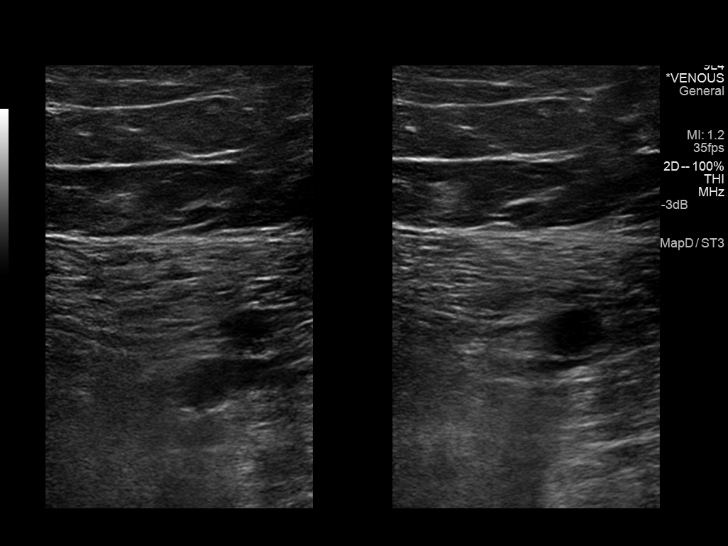
[im 25/45]
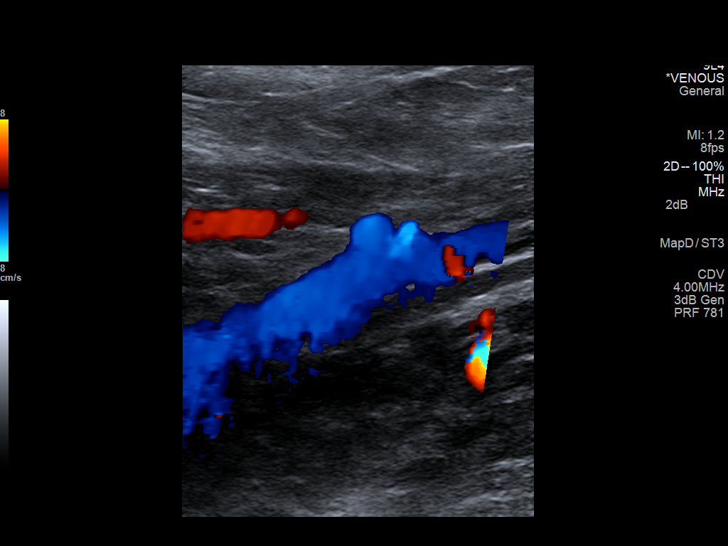
[im 29/45]
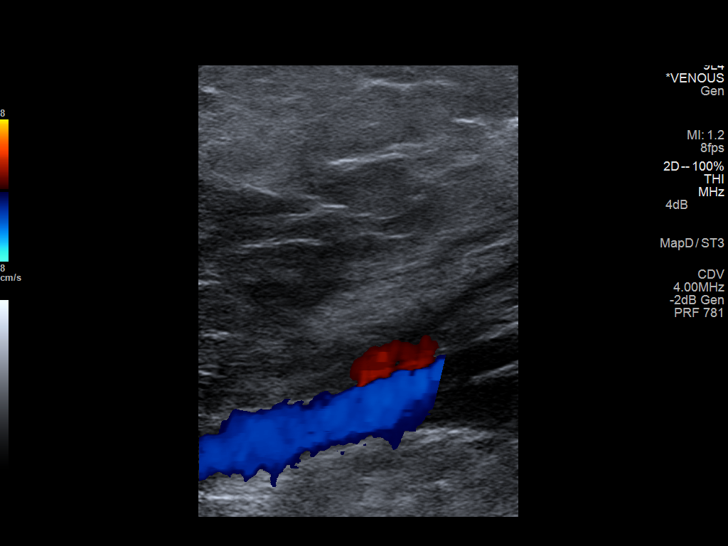
[im 33/45]
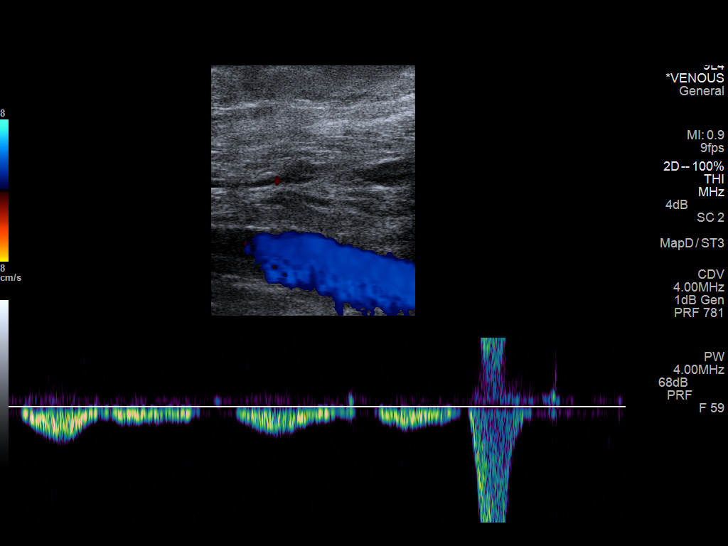
[im 37/45]
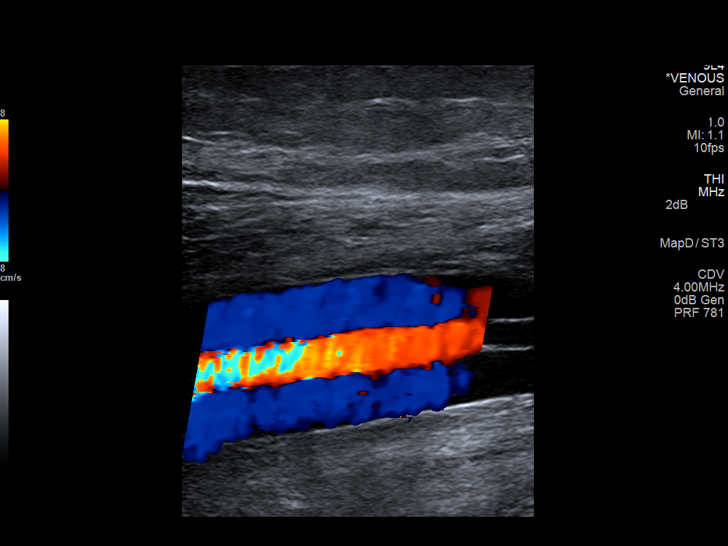
[im 41/45]
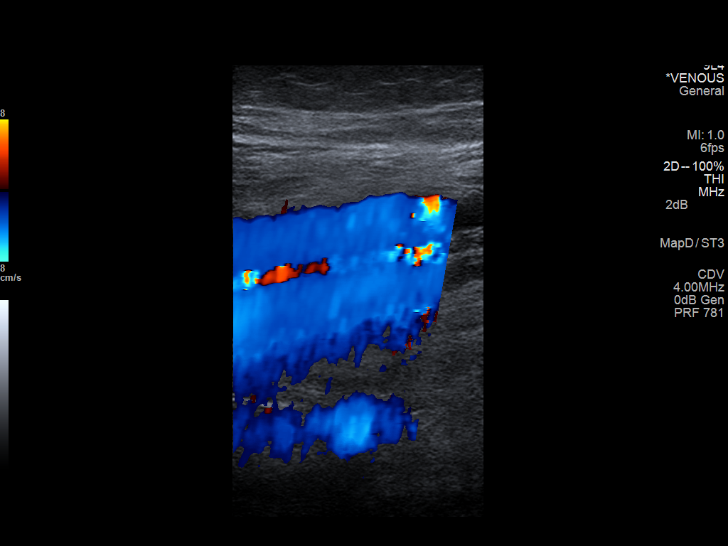
[im 45/45]
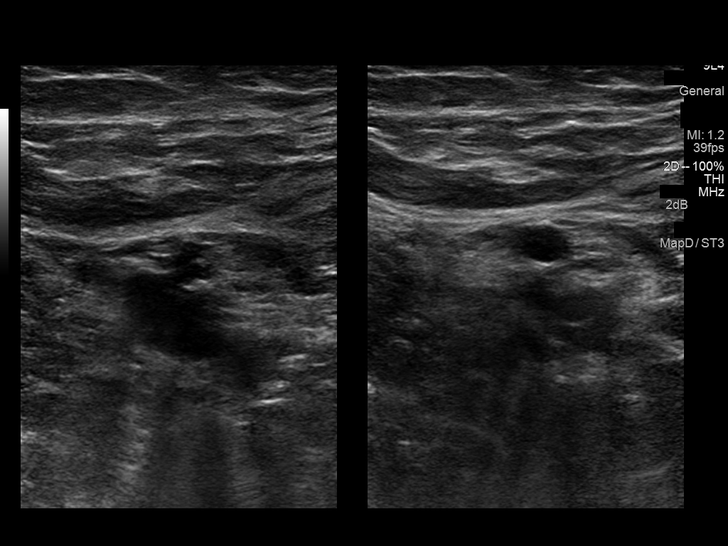

[13 of 24 positions shown; findings below may reference images not displayed]

FINDINGS: Contralateral Common Femoral Vein: Respiratory phasicity is normal
and symmetric with the symptomatic side. No evidence of thrombus.
Normal compressibility.

Common Femoral Vein: No evidence of thrombus. Normal
compressibility, respiratory phasicity and response to augmentation.

Saphenofemoral Junction: No evidence of thrombus. Normal
compressibility and flow on color Doppler imaging.

Profunda Femoral Vein: No evidence of thrombus. Normal
compressibility and flow on color Doppler imaging.

Femoral Vein: No evidence of thrombus. Normal compressibility,
respiratory phasicity and response to augmentation.

Popliteal Vein: No evidence of thrombus. Normal compressibility,
respiratory phasicity and response to augmentation.

Calf Veins: No evidence of thrombus. Normal compressibility and flow
on color Doppler imaging.

Superficial Great Saphenous Vein: No evidence of thrombus. Normal
compressibility and flow on color Doppler imaging.

Venous Reflux:  None.

Other Findings:  None.
IMPRESSION: No evidence of deep venous thrombosis in RIGHT lower extremity.

## 2017-02-14 MED ORDER — CHLORHEXIDINE GLUCONATE 0.12 % MT SOLN
15.0000 mL | Freq: Two times a day (BID) | OROMUCOSAL | Status: DC
  Administered 2017-02-14 – 2017-02-16 (×5): 15 mL via OROMUCOSAL
  Filled 2017-02-14 (×5): qty 15

## 2017-02-14 MED ORDER — METHYLPREDNISOLONE SODIUM SUCC 40 MG IJ SOLR
40.0000 mg | Freq: Two times a day (BID) | INTRAMUSCULAR | Status: DC
  Administered 2017-02-14 – 2017-02-16 (×4): 40 mg via INTRAVENOUS
  Filled 2017-02-14 (×4): qty 1

## 2017-02-14 MED ORDER — ORAL CARE MOUTH RINSE
15.0000 mL | Freq: Two times a day (BID) | OROMUCOSAL | Status: DC
  Administered 2017-02-14 – 2017-02-15 (×3): 15 mL via OROMUCOSAL

## 2017-02-14 MED ORDER — TRAZODONE HCL 100 MG PO TABS
100.0000 mg | ORAL_TABLET | Freq: Every day | ORAL | Status: DC
  Administered 2017-02-14 – 2017-02-15 (×2): 100 mg via ORAL
  Filled 2017-02-14 (×2): qty 1

## 2017-02-14 MED ORDER — BUDESONIDE 0.25 MG/2ML IN SUSP
0.2500 mg | Freq: Four times a day (QID) | RESPIRATORY_TRACT | Status: DC
  Administered 2017-02-14: 14:00:00 0.25 mg via RESPIRATORY_TRACT
  Filled 2017-02-14: qty 2

## 2017-02-14 MED ORDER — HYDROCODONE-ACETAMINOPHEN 5-325 MG PO TABS
1.0000 | ORAL_TABLET | Freq: Three times a day (TID) | ORAL | Status: DC | PRN
  Administered 2017-02-14 – 2017-02-16 (×3): 1 via ORAL
  Filled 2017-02-14 (×3): qty 1

## 2017-02-14 MED ORDER — BUDESONIDE 0.25 MG/2ML IN SUSP
0.2500 mg | Freq: Four times a day (QID) | RESPIRATORY_TRACT | Status: DC
  Administered 2017-02-14 – 2017-02-16 (×6): 0.25 mg via RESPIRATORY_TRACT
  Filled 2017-02-14 (×7): qty 2

## 2017-02-14 MED ORDER — ENOXAPARIN SODIUM 40 MG/0.4ML ~~LOC~~ SOLN
40.0000 mg | Freq: Two times a day (BID) | SUBCUTANEOUS | Status: DC
  Administered 2017-02-14 – 2017-02-15 (×3): 40 mg via SUBCUTANEOUS
  Filled 2017-02-14 (×4): qty 0.4

## 2017-02-14 MED ORDER — INSULIN ASPART 100 UNIT/ML ~~LOC~~ SOLN
0.0000 [IU] | Freq: Three times a day (TID) | SUBCUTANEOUS | Status: DC
  Filled 2017-02-14: qty 2

## 2017-02-14 MED ORDER — ACETAMINOPHEN 325 MG PO TABS
650.0000 mg | ORAL_TABLET | Freq: Four times a day (QID) | ORAL | Status: DC | PRN
  Administered 2017-02-15: 650 mg via ORAL
  Filled 2017-02-14: qty 2

## 2017-02-14 MED ORDER — DEXTROSE 5 % IV SOLN
1.0000 g | INTRAVENOUS | Status: DC
  Administered 2017-02-14 – 2017-02-15 (×2): 1 g via INTRAVENOUS
  Filled 2017-02-14 (×3): qty 10

## 2017-02-14 NOTE — Care Management (Addendum)
Message left for Jeneen Rinks with [facility] to check O2 status.  Message left for Edwina with [facility] to check status of this patient per last note: [place].  [place] [address].  [place]  Cell [phone]. Alvis Lemmings closed patient out in January in 2018. Apparently patient has an outstanding bill with [employer].

## 2017-02-14 NOTE — Progress Notes (Signed)
Anticoagulation monitoring(Lovenox):  60yo  F ordered Lovenox 40 mg Q24h  Filed Weights   02/13/17 1638  Weight: 250 lb (113.4 kg)   BMI 41.7   Lab Results  Component Value Date   CREATININE 0.73 02/14/2017   CREATININE 0.81 02/13/2017   CREATININE 0.74 11/01/2016   Estimated Creatinine Clearance: 95.1 mL/min (by C-G formula based on SCr of 0.73 mg/dL). Hemoglobin & Hematocrit     Component Value Date/Time   HGB 14.0 02/14/2017 0527   HGB 15.2 02/02/2015 0415   HCT 43.6 02/14/2017 0527   HCT 45.5 10/30/2015 1007     Per Protocol for Patient with estCrcl > 30 ml/min and BMI > 40, will transition to Lovenox 40 mg Q12h.     Chinita Greenland PharmD Clinical Pharmacist 02/14/2017

## 2017-02-14 NOTE — Progress Notes (Signed)
Much improved. Comfortable off of BiPAP. Requiring oxygen at 3 L/m by nasal cannula. No new complaints.  Vitals:   02/14/17 0900 02/14/17 1000 02/14/17 1100 02/14/17 1115  BP: (!) 153/82 103/67 111/64   Pulse: 74 83 82 83  Resp: 19 19 (!) 23 17  Temp:      TempSrc:      SpO2: 96% 90% (!) 88% 92%  Weight:      Height:       NAD HEENT WNL No JVD Diffuse coarse wheezes and scattered rhonchi Regular, no murmurs Obese, soft, bowel sounds present Extremities warm without edema No focal neurological deficits  BMP Latest Ref Rng & Units 02/14/2017 02/13/2017 11/01/2016  Glucose 65 - 99 mg/dL 153(H) 118(H) -  BUN 6 - 20 mg/dL 19 14 -  Creatinine 0.44 - 1.00 mg/dL 0.73 0.81 0.74  Sodium 135 - 145 mmol/L 145 144 -  Potassium 3.5 - 5.1 mmol/L 3.8 3.7 -  Chloride 101 - 111 mmol/L 104 107 -  CO2 22 - 32 mmol/L 32 29 -  Calcium 8.9 - 10.3 mg/dL 9.1 8.9 -   CBC Latest Ref Rng & Units 02/14/2017 02/13/2017 11/03/2016  WBC 3.6 - 11.0 K/uL 11.5(H) 14.7(H) 8.6  Hemoglobin 12.0 - 16.0 g/dL 14.0 14.0 13.7  Hematocrit 35.0 - 47.0 % 43.6 42.5 40.6  Platelets 150 - 440 K/uL 134(L) 152 133(L)   CXR: Borderline cardiomegaly, prominent pulmonary arteries, bibasilar atelectasis  IMPRESSION: Acute on chronic hypoxemic/hypercarbic respiratory failure Smoker Acute COPD exacerbation  PLAN/REC: Continue supplemental oxygen Continue nebulized steroids and bronchodilators Continue systemic steroids - dose adjusted Continue antibiotics - changed to ceftriaxone. Complete a total of 5-7 days of antibiotics Counseled regarding smoking cessation Transfer to MedSurg She is followed by Dr. Raul Del as an outpatient. Please notify him if further pulmonary involvement is desired  After transfer, PCCM will sign off. Please call if we can be of further assistance  Merton Border, MD PCCM service Mobile [phone] Pager [phone] 02/14/2017 12:06 PM

## 2017-02-14 NOTE — Care Management Note (Signed)
Case Management Note  Patient Details  Name: Shamaine Mulkern MRN: [id number] Date of Birth: Jul 01, 1956  Subjective/Objective:                  RNCM met with patient and a "dear friend" to discuss discharge planning. Patient states she is on chronic O2 at home, has concentrator, [employer], and [employer] through [place] since 2016. She owes them over $3K and per [facility] they have attempted to help patient make "financial hardship" arrangements however patient fails to provide financial statements needed. I presented this to patient and she is aware but claims that "[facility] has not tried to call her about this". Per [facility] there "are multiple notes on patient communications however she has not answered her phone the last several times they've tried to contact her". I confirmed with patient that her contact number is the same [phone] but her address (which is temporary) is [place] [id number]. She drives and states that she is current with Dr. Signe Colt [phone] with [facility]. I have confirmed that patient is current with Dr. Jeananne Rama.   Action/Plan: Appointment made with Dr. Freddi Starr 03/02/17 at [id number]- patient aware and agrees. Patient also aware that someone will need to bring in her portable O2 tank for discharge. I attempted to call patient's cell and it does not appear to be a working number- it rang two times and then rang busy.   Expected Discharge Date:                  Expected Discharge Plan:     In-House Referral:     Discharge planning Services  CM Consult  Post Acute Care Choice:  Home Health, Durable Medical Equipment Choice offered to:  Patient  DME Arranged:    DME Agency:  [place]  HH Arranged:    [place] Agency:     Status of Service:  In process, will continue to follow  If discussed at Long Length of Stay Meetings, dates discussed:    Additional Comments:  Marshell Garfinkel, RN 02/14/2017, 11:13 AM

## 2017-02-14 NOTE — Consult Note (Signed)
[facility] Face-to-Face Psychiatry Consult   Reason for Consult:  Consult for 61 year old woman with no major past psychiatric history currently with symptoms of anxiety and sadness Referring Physician:  Mody Patient Identification: Theresa [name] MRN:  [id number] Principal Diagnosis: Grief at loss of child Diagnosis:   Patient Active Problem List   Diagnosis Date Noted  . Grief at loss of child [F43.21, Z63.4] 02/14/2017  . Acute respiratory failure with hypoxia ([place]) [J96.01] 02/13/2017  . Lumbar facet hypertrophy (multilevel) [M47.896] 11/17/2016  . Lumbar spinal stenosis (L4-5) [M48.061] 11/17/2016  . 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) [M99.83] 11/17/2016  . 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) [M51.26] 11/17/2016  . Lumbosacral radiculopathy at L4 [M54.17] 11/17/2016  . Chronic hip pain (Location of Primary Source of Pain) (Left) [R44.315, G89.29] 11/01/2016  . Chronic hip pain (Location of Primary Source of Pain) (Right) [M25.551, G89.29] 11/01/2016  . Acute respiratory failure ([place]) [J96.00] 10/25/2016  . Supplemental oxygen dependent [Z99.81] 08/01/2016  . Sleep apnea [G47.30] 06/02/2016  . Long term current use of opiate analgesic [Z79.891] 01/12/2016  . Long term prescription opiate use [Z79.891] 01/12/2016  . Opiate use (15 MME/Day) [F11.90] 01/12/2016  . Lumbar facet syndrome (Location of Primary Source of Pain) (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) [M46.96] 01/12/2016  . Chronic sacroiliac joint pain (Bilateral) [M53.3, G89.29] 01/12/2016  . Vitamin D deficiency [E55.9] 12/30/2015  . Chronic hip pain (Location of Primary Source of Pain) (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) [M25.559, G89.29] 12/28/2015  . 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) [M16.0] 12/28/2015  . Obesity, Class III, BMI 40-49.9 (morbid obesity) ([place]) [E66.01] 12/28/2015  . 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 Coagulation disorder ([place]) [D68.9] 10/22/2015  . Chronic pain syndrome  (significant psychosocial component) [G89.4] 09/21/2015  . 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 [F45.42] 09/21/2015  . Encounter for therapeutic drug level monitoring [Z51.81] 09/16/2015  . Encounter for chronic pain management [G89.29] 09/16/2015  . Pain management [R52] 09/16/2015  . Neurogenic pain [M79.2] 09/16/2015  . Neuropathic pain [M79.2] 09/16/2015  . Musculoskeletal pain [M79.1] 09/16/2015  . Lumbar spondylosis (L4-5) [M47.816] 09/16/2015  . Chronic low back pain (Location of Primary Source of Pain) (Left) [M54.5, G89.29] 09/16/2015  . Chronic lower extremity pain (Location of Secondary source of pain) (Left) [Q00.867, G89.29] 09/16/2015  . Chronic lumbar radicular pain (Location of Secondary source of pain) (L4 & S1 Dermatome) (Left) [M54.16, G89.29] 09/16/2015  . Chronic shoulder pain (Right side) [M25.511, G89.29] 09/16/2015  . Chronic upper extremity pain (Right-sided) [M79.601, G89.29] 09/16/2015  . Cervical radiculitis (Right side) [M54.12] 09/16/2015  . Abnormal x-ray of lumbar spine [R93.7] 09/16/2015  . Chronic diastolic heart failure ([place]) [I50.32] 07/15/2015  . Chest pain [R07.9] 07/15/2015  . Tobacco abuse [Z72.0] 07/15/2015  . Blood clotting disorder ([place]) [D68.9] 04/21/2015  . Decreased motor strength [R53.1] 07/16/2014  . Clinical depression [F32.9] 07/16/2014  . Chronic rhinitis [J31.0] 07/16/2014  . Carpal tunnel syndrome [G56.00] 07/16/2014  . Anxiety state [F41.1] 07/16/2014  . Neuritis or radiculitis due to rupture of lumbar intervertebral disc [M51.16] 07/02/2014  . DDD (degenerative disc disease), lumbar [M51.36] 07/02/2014  . CAFL (chronic airflow limitation) ([place]) [J44.9] 02/27/2014  . Chronic obstructive pulmonary disease ([place]) [J44.9] 02/27/2014  . Essential (primary) hypertension [I10] 03/27/2008    Total Time spent with patient: 1 hour  Subjective:   Theresa [name] is a 61 y.o. female patient admitted with "she was  my baby".  HPI:  61 year old woman who was admitted to the hospital with shortness of  breath and anxiety. She has a history of major medical problems including severe COPD and congestive heart failure. She reports that over the last couple of days she has had 2 episodes of fairly sudden onset shortness of breath and heart palpitations dizziness and anxiety. This in addition to her mood being sad and down much of the time. Preoccupied with grief. Difficulty concentrating. Poor sleep and less than normal food intake. Patient's daughter died a little less than one month ago. Patient obviously continues to be preoccupied and overwhelmed by grief. Additionally feels that she is not getting the support in her family that she would like. Has some complaints about how some members of her family of treated her. Seems to have significant stress otherwise as well with a not completely stable living situation. Her cardiologist did give her a small amount of benzodiazepine recently but she is coming to the end of the prescription. Does not have any current outpatient psychiatric treatment. Denies any suicidal thoughts.  Social history: She had been living with her daughter prior to her daughter's death and since then has a somewhat unstable living situation. She does think that she'll be able to go back to a safe living situation when she gets out of the hospital but it's not a safe long-term plan.  Medical history: Multiple significant medical problems including COPD and congestive heart failure. She is full-time oxygen dependent outside the hospital.  Substance abuse history: Denies any alcohol or drug abuse although she says that there was a time in the past when she drank too much. She says she has not had any alcohol in at least 6 years. Past Psychiatric History: Patient denies any past psychiatric hospitalization. Denies any suicide attempts. Denies any past psychiatric medication that she can recall other than  recent benzodiazepines.  Risk to Self: Is patient at risk for suicide?: No Risk to Others:   Prior Inpatient Therapy:   Prior Outpatient Therapy:    Past Medical History:  Past Medical History:  Diagnosis Date  . Anxiety   . Carpal tunnel syndrome   . CHF (congestive heart failure) ([place])   . CHF (congestive heart failure) ([place])    1/18  . Chronic generalized abdominal pain   . Chronic rhinitis   . COPD (chronic obstructive pulmonary disease) ([place])   . DDD (degenerative disc disease), cervical   . Depression   . Edema   . Flu    1/18  . Gastritis   . Hematuria   . Hypertension   . Kidney stones   . Low back pain   . Lumbar radiculitis   . Malodorous urine   . Muscle weakness   . Obesity   . Renal cyst   . Sensory urge incontinence   . Tobacco abuse     Past Surgical History:  Procedure Laterality Date  . ABDOMINAL HYSTERECTOMY    . CARPAL TUNNEL RELEASE Left   . TONSILLECTOMY    . TUBAL LIGATION     Family History:  Family History  Problem Relation Age of Onset  . Heart disease Mother   . Stroke Mother   . Coronary artery disease Mother   . Lung cancer Sister   . Alcohol abuse Father   . Heart disease Father   . Prostate cancer Neg Hx   . Kidney cancer Neg Hx   . Bladder Cancer Neg Hx    Family Psychiatric  History: Patient's daughter who passed away had significant mood problems and a chronic substance  abuse problem. There are some other family members with mood lability as well. Social History:  History  Alcohol Use No     History  Drug Use No    Social History   Social History  . Marital status: Married    Spouse name: N/A  . Number of children: N/A  . Years of education: N/A   Social History Main Topics  . Smoking status: Current Every Day Smoker    Packs/day: 1.00    Years: 40.00    Types: Cigarettes  . Smokeless tobacco: Never Used     Comment: first day of patch patient states she's trying  . Alcohol use No  . Drug use: No  .  Sexual activity: No   Other Topics Concern  . None   Social History Narrative   Lives with her daughter   Additional Social History:    Allergies:   Allergies  Allergen Reactions  . Codeine Nausea And Vomiting  . Meloxicam Other (See Comments)    Stomach pain    Labs:  Results for orders placed or performed during the hospital encounter of 02/13/17 (from the past 48 hour(s))  Basic metabolic panel     Status: Abnormal   Collection Time: 02/13/17  4:39 PM  Result Value Ref Range   Sodium 144 135 - 145 mmol/L   Potassium 3.7 3.5 - 5.1 mmol/L    Comment: HEMOLYSIS AT THIS LEVEL MAY AFFECT RESULT   Chloride 107 101 - 111 mmol/L   CO2 29 22 - 32 mmol/L   Glucose, Bld 118 (H) 65 - 99 mg/dL   BUN 14 6 - 20 mg/dL   Creatinine, Ser 0.81 0.44 - 1.00 mg/dL   Calcium 8.9 8.9 - 10.3 mg/dL   GFR calc non Af Amer >60 >60 mL/min   GFR calc Af Amer >60 >60 mL/min    Comment: (NOTE) The eGFR has been calculated using the CKD EPI equation. This calculation has not been validated in all clinical situations. eGFR's persistently <60 mL/min signify possible Chronic Kidney Disease.    Anion gap 8 5 - 15  CBC     Status: Abnormal   Collection Time: 02/13/17  4:39 PM  Result Value Ref Range   WBC 14.7 (H) 3.6 - 11.0 K/uL   RBC 4.44 3.80 - 5.20 MIL/uL   Hemoglobin 14.0 12.0 - 16.0 g/dL   HCT 42.5 35.0 - 47.0 %   MCV 95.8 80.0 - 100.0 fL   MCH 31.6 26.0 - 34.0 pg   MCHC 33.0 32.0 - 36.0 g/dL   RDW 15.6 (H) 11.5 - 14.5 %   Platelets 152 150 - 440 K/uL  Troponin I     Status: None   Collection Time: 02/13/17  4:39 PM  Result Value Ref Range   Troponin I <0.03 <0.03 ng/mL  Brain natriuretic peptide     Status: Abnormal   Collection Time: 02/13/17  4:39 PM  Result Value Ref Range   B Natriuretic Peptide 161.0 (H) 0.0 - 100.0 pg/mL  Culture, blood (routine x 2)     Status: None (Preliminary result)   Collection Time: 02/13/17  4:39 PM  Result Value Ref Range   Specimen Description  BLOOD LEFT HAND    Special Requests      BOTTLES DRAWN AEROBIC AND ANAEROBIC Blood Culture results may not be optimal due to an inadequate volume of blood received in culture bottles   Culture NO GROWTH < 24 HOURS    Report  Status PENDING   Magnesium     Status: None   Collection Time: 02/13/17  4:39 PM  Result Value Ref Range   Magnesium 2.1 1.7 - 2.4 mg/dL  Lactic acid, plasma     Status: None   Collection Time: 02/13/17  4:40 PM  Result Value Ref Range   Lactic Acid, Venous 1.0 0.5 - 1.9 mmol/L  Culture, blood (routine x 2)     Status: None (Preliminary result)   Collection Time: 02/13/17  4:40 PM  Result Value Ref Range   Specimen Description BLOOD LEFT ASSIST CONTROL    Special Requests      BOTTLES DRAWN AEROBIC AND ANAEROBIC Blood Culture adequate volume   Culture NO GROWTH < 24 HOURS    Report Status PENDING   Urinalysis, Complete w Microscopic     Status: Abnormal   Collection Time: 02/13/17  6:15 PM  Result Value Ref Range   Color, Urine COLORLESS (A) YELLOW   APPearance CLEAR (A) CLEAR   Specific Gravity, Urine 1.005 1.005 - 1.030   pH 5.0 5.0 - 8.0   Glucose, UA NEGATIVE NEGATIVE mg/dL   Hgb urine dipstick SMALL (A) NEGATIVE   Bilirubin Urine NEGATIVE NEGATIVE   Ketones, ur NEGATIVE NEGATIVE mg/dL   Protein, ur NEGATIVE NEGATIVE mg/dL   Nitrite NEGATIVE NEGATIVE   Leukocytes, UA NEGATIVE NEGATIVE   RBC / HPF 0-5 0 - 5 RBC/hpf   WBC, UA 0-5 0 - 5 WBC/hpf   Bacteria, UA RARE (A) NONE SEEN   Squamous Epithelial / LPF 0-5 (A) NONE SEEN  MRSA PCR Screening     Status: None   Collection Time: 02/13/17  9:04 PM  Result Value Ref Range   MRSA by PCR NEGATIVE NEGATIVE    Comment:        The GeneXpert MRSA Assay (FDA approved for NASAL specimens only), is one component of a comprehensive MRSA colonization surveillance program. It is not intended to diagnose MRSA infection nor to guide or monitor treatment for MRSA infections.   Glucose, capillary     Status:  Abnormal   Collection Time: 02/13/17  9:17 PM  Result Value Ref Range   Glucose-Capillary 139 (H) 65 - 99 mg/dL   Comment 1 Notify RN    Comment 2 Document in Chart   Basic metabolic panel     Status: Abnormal   Collection Time: 02/14/17  5:27 AM  Result Value Ref Range   Sodium 145 135 - 145 mmol/L   Potassium 3.8 3.5 - 5.1 mmol/L   Chloride 104 101 - 111 mmol/L   CO2 32 22 - 32 mmol/L   Glucose, Bld 153 (H) 65 - 99 mg/dL   BUN 19 6 - 20 mg/dL   Creatinine, Ser 0.73 0.44 - 1.00 mg/dL   Calcium 9.1 8.9 - 10.3 mg/dL   GFR calc non Af Amer >60 >60 mL/min   GFR calc Af Amer >60 >60 mL/min    Comment: (NOTE) The eGFR has been calculated using the CKD EPI equation. This calculation has not been validated in all clinical situations. eGFR's persistently <60 mL/min signify possible Chronic Kidney Disease.    Anion gap 9 5 - 15  CBC     Status: Abnormal   Collection Time: 02/14/17  5:27 AM  Result Value Ref Range   WBC 11.5 (H) 3.6 - 11.0 K/uL   RBC 4.45 3.80 - 5.20 MIL/uL   Hemoglobin 14.0 12.0 - 16.0 g/dL   HCT 43.6 35.0 -  47.0 %   MCV 98.1 80.0 - 100.0 fL   MCH 31.5 26.0 - 34.0 pg   MCHC 32.1 32.0 - 36.0 g/dL   RDW 15.5 (H) 11.5 - 14.5 %   Platelets 134 (L) 150 - 440 K/uL  Culture, expectorated sputum-assessment     Status: None   Collection Time: 02/14/17  8:49 AM  Result Value Ref Range   Specimen Description EXPECTORATED SPUTUM    Special Requests NONE    Sputum evaluation THIS SPECIMEN IS ACCEPTABLE FOR SPUTUM CULTURE    Report Status 02/14/2017 FINAL   Culture, respiratory (NON-Expectorated)     Status: None (Preliminary result)   Collection Time: 02/14/17  8:49 AM  Result Value Ref Range   Specimen Description EXPECTORATED SPUTUM    Special Requests NONE Reflexed from [id number]    Gram Stain      ABUNDANT WBC PRESENT,BOTH PMN AND MONONUCLEAR ABUNDANT GRAM NEGATIVE RODS MODERATE GRAM POSITIVE COCCI IN PAIRS RARE GRAM POSITIVE RODS Performed at [facility],  [address] [place], [place] [address]    Culture PENDING    Report Status PENDING   Glucose, capillary     Status: Abnormal   Collection Time: 02/14/17  5:03 PM  Result Value Ref Range   Glucose-Capillary 140 (H) 65 - 99 mg/dL    Current Facility-Administered Medications  Medication Dose Route Frequency Provider Last Rate Last Dose  . 0.9 %  sodium chloride infusion  250 mL Intravenous PRN Demetrios Loll, MD      . acetaminophen (TYLENOL) tablet 650 mg  650 mg Oral Q6H PRN Sital Mody, MD      . albuterol (PROVENTIL) (2.5 MG/3ML) 0.083% nebulizer solution 2.5 mg  2.5 mg Nebulization Q2H PRN Demetrios Loll, MD      . ALPRAZolam Duanne Moron) tablet 0.5 mg  0.5 mg Oral BID PRN Demetrios Loll, MD   0.5 mg at 02/14/17 1046  . bisacodyl (DULCOLAX) suppository 10 mg  10 mg Rectal Daily PRN Demetrios Loll, MD      . bisoprolol-hydrochlorothiazide [facility]) 10-6.25 MG per tablet 1 tablet  1 tablet Oral Daily Demetrios Loll, MD   1 tablet at 02/14/17 1038  . budesonide (PULMICORT) nebulizer solution 0.25 mg  0.25 mg Nebulization Q6H Wilhelmina Mcardle, MD      . cefTRIAXone (ROCEPHIN) 1 g in dextrose 5 % 50 mL IVPB  1 g Intravenous Q24H Wilhelmina Mcardle, MD   Stopped at 02/14/17 1146  . chlorhexidine (PERIDEX) 0.12 % solution 15 mL  15 mL Mouth Rinse BID Demetrios Loll, MD   15 mL at 02/14/17 1105  . enoxaparin (LOVENOX) injection 40 mg  40 mg Subcutaneous Q12H Sital Mody, MD      . furosemide (LASIX) tablet 40 mg  40 mg Oral Daily Demetrios Loll, MD   40 mg at 02/14/17 7829  . guaiFENesin-dextromethorphan (ROBITUSSIN DM) 100-10 MG/5ML syrup 5 mL  5 mL Oral Q4H PRN Demetrios Loll, MD      . HYDROcodone-acetaminophen (NORCO/VICODIN) 5-325 MG per tablet 1 tablet  1 tablet Oral Q8H PRN Sital Mody, MD      . insulin aspart (novoLOG) injection 0-15 Units  0-15 Units Subcutaneous TID WC Sital Mody, MD      . ipratropium-albuterol (DUONEB) 0.5-2.5 (3) MG/3ML nebulizer solution 3 mL  3 mL Nebulization Q6H Demetrios Loll, MD   3 mL at 02/14/17 1421  . MEDLINE mouth  rinse  15 mL Mouth Rinse q12n4p Demetrios Loll, MD   [phone]  mL at 02/14/17 1106  . methylPREDNISolone sodium succinate (SOLU-MEDROL) 40 mg/mL injection 40 mg  40 mg Intravenous Q12H Wilhelmina Mcardle, MD      . nicotine (NICODERM CQ - dosed in mg/24 hours) patch 14 mg  14 mg Transdermal Daily Demetrios Loll, MD   14 mg at 02/14/17 0940  . ondansetron (ZOFRAN) tablet 4 mg  4 mg Oral Q6H PRN Demetrios Loll, MD       Or  . ondansetron [facility]) injection 4 mg  4 mg Intravenous Q6H PRN Demetrios Loll, MD      . potassium chloride SA (K-DUR,KLOR-CON) CR tablet 20 mEq  20 mEq Oral Daily Demetrios Loll, MD   20 mEq at 02/14/17 0939  . roflumilast (DALIRESP) tablet 500 mcg  500 mcg Oral Daily Demetrios Loll, MD   500 mcg at 02/14/17 1145  . senna-docusate (Senokot-S) tablet 1 tablet  1 tablet Oral QHS PRN Demetrios Loll, MD      . sertraline (ZOLOFT) tablet 100 mg  100 mg Oral Daily Demetrios Loll, MD   100 mg at 02/14/17 0945  . sodium chloride flush (NS) 0.9 % injection 3 mL  3 mL Intravenous Q12H Demetrios Loll, MD   3 mL at 02/14/17 0944  . sodium chloride flush (NS) 0.9 % injection 3 mL  3 mL Intravenous PRN Demetrios Loll, MD   3 mL at 02/14/17 0944  . traZODone (DESYREL) tablet 100 mg  100 mg Oral QHS Gonzella Lex, MD        Musculoskeletal: Strength & Muscle Tone: decreased Gait & Station: unsteady Patient leans: N/A  Psychiatric Specialty Exam: Physical Exam  Nursing note and vitals reviewed. Constitutional: She appears well-developed and well-nourished.  HENT:  Head: Normocephalic and atraumatic.  Eyes: Conjunctivae are normal. Pupils are equal, round, and reactive to light.  Neck: Normal range of motion.  Cardiovascular: Regular rhythm and normal heart sounds.   Respiratory: She is in respiratory distress. She has wheezes.  GI: Soft.  Musculoskeletal: Normal range of motion.  Neurological: She is alert.  Skin: Skin is warm and dry.  Psychiatric: Judgment normal. Her mood appears anxious. Her speech is delayed. She is slowed. Thought  content is not paranoid. Cognition and memory are not impaired. She expresses no homicidal and no suicidal ideation. She exhibits normal recent memory.    Review of Systems  Constitutional: Negative.   HENT: Negative.   Eyes: Negative.   Respiratory: Positive for shortness of breath.   Cardiovascular: Negative.   Gastrointestinal: Negative.   Musculoskeletal: Negative.   Skin: Negative.   Neurological: Negative.   Psychiatric/Behavioral: Positive for depression. Negative for hallucinations, memory loss, substance abuse and suicidal ideas. The patient is nervous/anxious and has insomnia.     Blood pressure 111/62, pulse 72, temperature 98.1 F (36.7 C), temperature source Oral, resp. rate 19, height 5' 5" (1.651 m), weight 113.4 kg (250 lb), SpO2 95 %.Body mass index is 41.6 kg/m.  General Appearance: Fairly Groomed  Eye Contact:  Fair  Speech:  Slow  Volume:  Decreased  Mood:  Anxious and Dysphoric  Affect:  Congruent  Thought Process:  Goal Directed  Orientation:  Full (Time, Place, and Person)  Thought Content:  Logical  Suicidal Thoughts:  No  Homicidal Thoughts:  No  Memory:  Immediate;   Good Recent;   Fair Remote;   Fair  Judgement:  Fair  Insight:  Fair  Psychomotor Activity:  Decreased  Concentration:  Concentration: Fair  Recall:  Fair  Fund of Knowledge:  Fair  Language:  Fair  Akathisia:  No  Handed:  Right  AIMS (if indicated):     Assets:  Communication Skills Desire for Improvement Social Support  ADL's:  Impaired  Cognition:  Impaired,  Mild  Sleep:        Treatment Plan Summary: Daily contact with patient to assess and evaluate symptoms and progress in treatment, Medication management and Plan 61 year old woman who is having normal grief but possibly developing panic attack symptoms as well. Certainly would be understandable under the circumstance particularly with her chronic medical problems. I understand the rationale for starting benzodiazepines  although I am concerned that long-term this could potentially exacerbate the problem especially if she becomes dependent on them and does not have a regular outpatient provider. Right now sleep seems to be a major issue. I suggest starting trazodone 100 mg at night for sleep. This may help as well with the anxiety attacks and help as a antidepressant and antianxiety medicine. Offered a lot of supportive counseling and encouragement to the patient with encouragement to stay closely in touch with loved ones and to avoid extra stress. I will follow-up as needed. No indication for psychiatric hospitalization. Outside the hospital she can probably be managed by her several primary care doctors although it might be a good idea to give her information to refer to local mental health providers as well.  Disposition: Patient does not meet criteria for psychiatric inpatient admission. Supportive therapy provided about ongoing stressors. Discussed crisis plan, support from social network, calling 911, coming to the Emergency Department, and calling Suicide Hotline.  Alethia Berthold, MD 02/14/2017 5:23 PM

## 2017-02-14 NOTE — Progress Notes (Signed)
Changed from BiPAP to [place] per MD.  Per MD verbal order, started on 2L oxygen were below the parameters, consulted MD because oxygen sats remained below 92 %.  Per MD verbal order, increased rate of [place] to 3L.

## 2017-02-14 NOTE — Progress Notes (Addendum)
[facility] NAME: Theresa [name]    MR#:  [id number]  DATE OF BIRTH:  Feb 23, 1956  SUBJECTIVE:   Patient off of BiPAP. Patient had recent loss of her daughter Going through grieving process  REVIEW OF SYSTEMS:    Review of Systems  Constitutional: Negative for fever, chills weight loss HENT: Negative for ear pain, nosebleeds, congestion, facial swelling, rhinorrhea, neck pain, neck stiffness and ear discharge.   Respiratory: [place] for cough, shortness of breath, and  wheezing  Cardiovascular: Negative for chest pain, palpitations and leg swelling.  Gastrointestinal: Negative for heartburn, abdominal pain, vomiting, diarrhea or consitpation Genitourinary: Negative for dysuria, urgency, frequency, hematuria Musculoskeletal: Negative for back pain or joint pain Neurological: Negative for dizziness, seizures, syncope, focal weakness,  numbness and headaches.  Hematological: Does not bruise/bleed easily.  Psychiatric/Behavioral: Negative for hallucinations, confusion,  positive dysphoric mood    Tolerating Diet: yes      DRUG ALLERGIES:   Allergies  Allergen Reactions  . Codeine Nausea And Vomiting  . Meloxicam Other (See Comments)    Stomach pain    VITALS:  Blood pressure 111/69, pulse 79, temperature 97.7 F (36.5 C), temperature source Oral, resp. rate 17, height 5' 5" (1.651 m), weight 113.4 kg (250 lb), SpO2 92 %.  PHYSICAL EXAMINATION:  Constitutional: Appears well-developed and well-nourished. No distress. HENT: Normocephalic. Marland Kitchen Oropharynx is clear and moist.  Eyes: Conjunctivae and EOM are normal. PERRLA, no scleral icterus.  Neck: Normal ROM. Neck supple. No JVD. No tracheal deviation. CVS: RRR, S1/S2 +, no murmurs, no gallops, no carotid bruit.  Pulmonary: Normal effort with bilateral wheezing  Abdominal: Soft. BS +,  no distension, tenderness, rebound or guarding.  Musculoskeletal: Normal range of motion.  No edema and no tenderness.  Neuro: Alert. CN 2-12 grossly intact. No focal deficits. Skin: Skin is warm and dry. No rash noted. Psychiatric:depressed affect    LABORATORY PANEL:   CBC  Recent Labs Lab 02/14/17 0527  WBC 11.5*  HGB 14.0  HCT 43.6  PLT 134*   ------------------------------------------------------------------------------------------------------------------  Chemistries   Recent Labs Lab 02/13/17 1639 02/14/17 0527  NA 144 145  K 3.7 3.8  CL 107 104  CO2 29 32  GLUCOSE 118* 153*  BUN 14 19  CREATININE 0.81 0.73  CALCIUM 8.9 9.1  MG 2.1  --    ------------------------------------------------------------------------------------------------------------------  Cardiac Enzymes  Recent Labs Lab 02/13/17 1639  TROPONINI <0.03   ------------------------------------------------------------------------------------------------------------------  RADIOLOGY:  Dg Chest 1 View  Result Date: 02/13/2017 CLINICAL DATA:  Shortness of breath and chest pain EXAM: CHEST 1 VIEW COMPARISON:  10/28/2016 FINDINGS: Chronic cardiomegaly and pulmonary artery enlargement, best seen at the [facility] and confirmed on January 2018 CT. Mild atelectasis or scarring bases. Streaky opacities on prior have improved. Chronic hyperinflation. No air bronchogram or pneumothorax. IMPRESSION: 1. No acute finding when compared to prior. 2. COPD and mild lung scarring. 3. Chronic cardiomegaly and pulmonary artery enlargement. Electronically Signed   By: Monte Fantasia M.D.   On: 02/13/2017 17:05   Dg Chest Port 1 View  Result Date: 02/14/2017 CLINICAL DATA:  Respiratory failure. EXAM: PORTABLE CHEST 1 VIEW COMPARISON:  02/13/2017, 11/02/2015.  He chest 10/24/2016. FINDINGS: Mediastinum and hilar structures are normal. Stable cardiomegaly. Prominent central pulmonary arteries are again noted consistent pulmonary hypertension. No pulmonary venous congestion. Persistent low lung volumes. Mild  bibasilar infiltrates cannot be excluded on today's exam. Small left pleural effusion cannot be excluded on today's  exam. No pneumothorax. IMPRESSION: 1. Stable cardiomegaly. Prominent central pulmonary arteries again noted consistent with pulmonary hypertension. No pulmonary venous congestion. 2. Persistent low lung volumes. Mild bibasilar infiltrates and small left pleural effusion cannot be excluded on today's exam. Electronically Signed   By: Marcello Moores  Register   On: 02/14/2017 06:50     ASSESSMENT AND PLAN:   61 year old female with chronic respiratory failure on 2 L of oxygen who presents with acute on chronic hypoxic respiratory failure in the setting of acute exacerbation of COPD.  1. Acute on chronic hypoxic respiratory failure in the setting of acute COPD exacerbation Patient is now weaned off of BiPAP and on 3 L of oxygen. She wears 2 L of oxygen at home.   2. Acute exacerbation of COPD: Continue steroids, nebs and inhalers. Continue ceftriaxone  3.Tobacco dependence: Patient is encouraged to quit smoking. Counseling was provided for 4 minutes.  4. Depression with recent loss of [facility] at [facility] stage Psychiatry consultation is requested by patient. Continue Zoloft and trazodone  5. Chronic diastolic heart failure: Continue Lasix  No signs of exacerbation   Management plans discussed with the patient and she is in agreement.  CODE STATUS: ful  TOTAL TIME TAKING CARE OF THIS PATIENT:  25 minutes.     POSSIBLE D/C 1-2 days, DEPENDING ON CLINICAL CONDITION.   Laval Cafaro M.D on 02/14/2017 at 12:36 PM  Between 7am to 6pm - Pager - [phone] After 6pm go to [email or web address] - password [email or web address] [facility]  Office  [phone]  CC: Primary care physician; [name], PA  Note: This dictation was prepared with Dragon dictation along with smaller phrase technology. Any transcriptional errors that result from this process are unintentional.

## 2017-02-15 ENCOUNTER — Inpatient Hospital Stay: Payer: Medicare PPO

## 2017-02-15 ENCOUNTER — Ambulatory Visit: Payer: Medicare PPO | Admitting: Urology

## 2017-02-15 LAB — GLUCOSE, CAPILLARY
Glucose-Capillary: 122 mg/dL — ABNORMAL HIGH (ref 65–99)
Glucose-Capillary: 147 mg/dL — ABNORMAL HIGH (ref 65–99)
Glucose-Capillary: 135 mg/dL — ABNORMAL HIGH (ref 65–99)
Glucose-Capillary: 114 mg/dL — ABNORMAL HIGH (ref 65–99)

## 2017-02-15 LAB — HIV ANTIBODY (ROUTINE TESTING W REFLEX): HIV Screen 4th Generation wRfx: NONREACTIVE

## 2017-02-15 MED ORDER — ALPRAZOLAM 0.5 MG PO TABS
0.5000 mg | ORAL_TABLET | Freq: Three times a day (TID) | ORAL | Status: DC | PRN
  Administered 2017-02-15 – 2017-02-16 (×2): 0.5 mg via ORAL
  Filled 2017-02-15 (×3): qty 1

## 2017-02-15 NOTE — Consult Note (Signed)
[facility] Face-to-Face Psychiatry Consult   Reason for Consult:  Consult for 61 year old woman with no major past psychiatric history currently with symptoms of anxiety and sadness Referring Physician:  Mody Patient Identification: Theresa [name] MRN:  [id number] Principal Diagnosis: Grief at loss of child Diagnosis:   Patient Active Problem List   Diagnosis Date Noted  . Grief at loss of child [F43.21, Z63.4] 02/14/2017  . Acute respiratory failure with hypoxia ([place]) [J96.01] 02/13/2017  . Lumbar facet hypertrophy (multilevel) [M47.896] 11/17/2016  . Lumbar spinal stenosis (L4-5) [M48.061] 11/17/2016  . 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) [M99.83] 11/17/2016  . 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) [M51.26] 11/17/2016  . Lumbosacral radiculopathy at L4 [M54.17] 11/17/2016  . Chronic hip pain (Location of Primary Source of Pain) (Left) [D17.616, G89.29] 11/01/2016  . Chronic hip pain (Location of Primary Source of Pain) (Right) [M25.551, G89.29] 11/01/2016  . Acute respiratory failure ([place]) [J96.00] 10/25/2016  . Supplemental oxygen dependent [Z99.81] 08/01/2016  . Sleep apnea [G47.30] 06/02/2016  . Long term current use of opiate analgesic [Z79.891] 01/12/2016  . Long term prescription opiate use [Z79.891] 01/12/2016  . Opiate use (15 MME/Day) [F11.90] 01/12/2016  . Lumbar facet syndrome (Location of Primary Source of Pain) (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) [M46.96] 01/12/2016  . Chronic sacroiliac joint pain (Bilateral) [M53.3, G89.29] 01/12/2016  . Vitamin D deficiency [E55.9] 12/30/2015  . Chronic hip pain (Location of Primary Source of Pain) (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) [M25.559, G89.29] 12/28/2015  . 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) [M16.0] 12/28/2015  . Obesity, Class III, BMI 40-49.9 (morbid obesity) ([place]) [E66.01] 12/28/2015  . 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 Coagulation disorder ([place]) [D68.9] 10/22/2015  . Chronic pain syndrome  (significant psychosocial component) [G89.4] 09/21/2015  . 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 [F45.42] 09/21/2015  . Encounter for therapeutic drug level monitoring [Z51.81] 09/16/2015  . Encounter for chronic pain management [G89.29] 09/16/2015  . Pain management [R52] 09/16/2015  . Neurogenic pain [M79.2] 09/16/2015  . Neuropathic pain [M79.2] 09/16/2015  . Musculoskeletal pain [M79.1] 09/16/2015  . Lumbar spondylosis (L4-5) [M47.816] 09/16/2015  . Chronic low back pain (Location of Primary Source of Pain) (Left) [M54.5, G89.29] 09/16/2015  . Chronic lower extremity pain (Location of Secondary source of pain) (Left) [W73.710, G89.29] 09/16/2015  . Chronic lumbar radicular pain (Location of Secondary source of pain) (L4 & S1 Dermatome) (Left) [M54.16, G89.29] 09/16/2015  . Chronic shoulder pain (Right side) [M25.511, G89.29] 09/16/2015  . Chronic upper extremity pain (Right-sided) [M79.601, G89.29] 09/16/2015  . Cervical radiculitis (Right side) [M54.12] 09/16/2015  . Abnormal x-ray of lumbar spine [R93.7] 09/16/2015  . Chronic diastolic heart failure ([place]) [I50.32] 07/15/2015  . Chest pain [R07.9] 07/15/2015  . Tobacco abuse [Z72.0] 07/15/2015  . Blood clotting disorder ([place]) [D68.9] 04/21/2015  . Decreased motor strength [R53.1] 07/16/2014  . Clinical depression [F32.9] 07/16/2014  . Chronic rhinitis [J31.0] 07/16/2014  . Carpal tunnel syndrome [G56.00] 07/16/2014  . Anxiety state [F41.1] 07/16/2014  . Neuritis or radiculitis due to rupture of lumbar intervertebral disc [M51.16] 07/02/2014  . DDD (degenerative disc disease), lumbar [M51.36] 07/02/2014  . CAFL (chronic airflow limitation) ([place]) [J44.9] 02/27/2014  . Chronic obstructive pulmonary disease ([place]) [J44.9] 02/27/2014  . Essential (primary) hypertension [I10] 03/27/2008    Total Time spent with patient: 15 minutes  Subjective:   Theresa [name] is a 61 y.o. female patient admitted with "she  was my baby".  Follow-up for Wednesday, May 2. Patient has no new complaints. Slept reasonably okay  last night. Mood is still down but reactive no worse than yesterday. No sign of suicidality. Patient is cooperating with treatment and making progress towards improvement. Says that she feels comfortable with her discharge plan and has a safe place to go.  HPI:  61 year old woman who was admitted to the hospital with shortness of breath and anxiety. She has a history of major medical problems including severe COPD and congestive heart failure. She reports that over the last couple of days she has had 2 episodes of fairly sudden onset shortness of breath and heart palpitations dizziness and anxiety. This in addition to her mood being sad and down much of the time. Preoccupied with grief. Difficulty concentrating. Poor sleep and less than normal food intake. Patient's daughter died a little less than one month ago. Patient obviously continues to be preoccupied and overwhelmed by grief. Additionally feels that she is not getting the support in her family that she would like. Has some complaints about how some members of her family of treated her. Seems to have significant stress otherwise as well with a not completely stable living situation. Her cardiologist did give her a small amount of benzodiazepine recently but she is coming to the end of the prescription. Does not have any current outpatient psychiatric treatment. Denies any suicidal thoughts.  Social history: She had been living with her daughter prior to her daughter's death and since then has a somewhat unstable living situation. She does think that she'll be able to go back to a safe living situation when she gets out of the hospital but it's not a safe long-term plan.  Medical history: Multiple significant medical problems including COPD and congestive heart failure. She is full-time oxygen dependent outside the hospital.  Substance abuse history:  Denies any alcohol or drug abuse although she says that there was a time in the past when she drank too much. She says she has not had any alcohol in at least 6 years. Past Psychiatric History: Patient denies any past psychiatric hospitalization. Denies any suicide attempts. Denies any past psychiatric medication that she can recall other than recent benzodiazepines.  Risk to Self: Is patient at risk for suicide?: No Risk to Others:   Prior Inpatient Therapy:   Prior Outpatient Therapy:    Past Medical History:  Past Medical History:  Diagnosis Date  . Anxiety   . Carpal tunnel syndrome   . CHF (congestive heart failure) ([place])   . CHF (congestive heart failure) ([place])    1/18  . Chronic generalized abdominal pain   . Chronic rhinitis   . COPD (chronic obstructive pulmonary disease) ([place])   . DDD (degenerative disc disease), cervical   . Depression   . Edema   . Flu    1/18  . Gastritis   . Hematuria   . Hypertension   . Kidney stones   . Low back pain   . Lumbar radiculitis   . Malodorous urine   . Muscle weakness   . Obesity   . Renal cyst   . Sensory urge incontinence   . Tobacco abuse     Past Surgical History:  Procedure Laterality Date  . ABDOMINAL HYSTERECTOMY    . CARPAL TUNNEL RELEASE Left   . TONSILLECTOMY    . TUBAL LIGATION     Family History:  Family History  Problem Relation Age of Onset  . Heart disease Mother   . Stroke Mother   . Coronary artery disease Mother   .  Lung cancer Sister   . Alcohol abuse Father   . Heart disease Father   . Prostate cancer Neg Hx   . Kidney cancer Neg Hx   . Bladder Cancer Neg Hx    Family Psychiatric  History: Patient's daughter who passed away had significant mood problems and a chronic substance abuse problem. There are some other family members with mood lability as well. Social History:  History  Alcohol Use No     History  Drug Use No    Social History   Social History  . Marital status: Married     Spouse name: N/A  . Number of children: N/A  . Years of education: N/A   Social History Main Topics  . Smoking status: Current Every Day Smoker    Packs/day: 1.00    Years: 40.00    Types: Cigarettes  . Smokeless tobacco: Never Used     Comment: first day of patch patient states she's trying  . Alcohol use No  . Drug use: No  . Sexual activity: No   Other Topics Concern  . None   Social History Narrative   Lives with her daughter   Additional Social History:    Allergies:   Allergies  Allergen Reactions  . Codeine Nausea And Vomiting  . Meloxicam Other (See Comments)    Stomach pain    Labs:  Results for orders placed or performed during the hospital encounter of 02/13/17 (from the past 48 hour(s))  MRSA PCR Screening     Status: None   Collection Time: 02/13/17  9:04 PM  Result Value Ref Range   MRSA by PCR NEGATIVE NEGATIVE    Comment:        The GeneXpert MRSA Assay (FDA approved for NASAL specimens only), is one component of a comprehensive MRSA colonization surveillance program. It is not intended to diagnose MRSA infection nor to guide or monitor treatment for MRSA infections.   Glucose, capillary     Status: Abnormal   Collection Time: 02/13/17  9:17 PM  Result Value Ref Range   Glucose-Capillary 139 (H) 65 - 99 mg/dL   Comment 1 Notify RN    Comment 2 Document in Chart   Basic metabolic panel     Status: Abnormal   Collection Time: 02/14/17  5:27 AM  Result Value Ref Range   Sodium 145 135 - 145 mmol/L   Potassium 3.8 3.5 - 5.1 mmol/L   Chloride 104 101 - 111 mmol/L   CO2 32 22 - 32 mmol/L   Glucose, Bld 153 (H) 65 - 99 mg/dL   BUN 19 6 - 20 mg/dL   Creatinine, Ser 0.73 0.44 - 1.00 mg/dL   Calcium 9.1 8.9 - 10.3 mg/dL   GFR calc non Af Amer >60 >60 mL/min   GFR calc Af Amer >60 >60 mL/min    Comment: (NOTE) The eGFR has been calculated using the CKD EPI equation. This calculation has not been validated in all clinical situations. eGFR's  persistently <60 mL/min signify possible Chronic Kidney Disease.    Anion gap 9 5 - 15  CBC     Status: Abnormal   Collection Time: 02/14/17  5:27 AM  Result Value Ref Range   WBC 11.5 (H) 3.6 - 11.0 K/uL   RBC 4.45 3.80 - 5.20 MIL/uL   Hemoglobin 14.0 12.0 - 16.0 g/dL   HCT 43.6 35.0 - 47.0 %   MCV 98.1 80.0 - 100.0 fL   MCH 31.5 26.0 -  34.0 pg   MCHC 32.1 32.0 - 36.0 g/dL   RDW 15.5 (H) 11.5 - 14.5 %   Platelets 134 (L) 150 - 440 K/uL  HIV antibody     Status: None   Collection Time: 02/14/17  5:27 AM  Result Value Ref Range   HIV Screen 4th Generation wRfx Non Reactive Non Reactive    Comment: (NOTE) Performed At: [facility] [address] [place], [place] [id number] Lindon Romp MD BU:[id number]   Culture, expectorated sputum-assessment     Status: None   Collection Time: 02/14/17  8:49 AM  Result Value Ref Range   Specimen Description EXPECTORATED SPUTUM    Special Requests NONE    Sputum evaluation THIS SPECIMEN IS ACCEPTABLE FOR SPUTUM CULTURE    Report Status 02/14/2017 FINAL   Culture, respiratory (NON-Expectorated)     Status: None (Preliminary result)   Collection Time: 02/14/17  8:49 AM  Result Value Ref Range   Specimen Description EXPECTORATED SPUTUM    Special Requests NONE Reflexed from [id number]    Gram Stain      ABUNDANT WBC PRESENT,BOTH PMN AND MONONUCLEAR ABUNDANT GRAM NEGATIVE RODS MODERATE GRAM POSITIVE COCCI IN PAIRS RARE GRAM POSITIVE RODS    Culture      CULTURE REINCUBATED FOR BETTER GROWTH Performed at [facility], [place] [address], [place], [place] [address]    Report Status PENDING   Glucose, capillary     Status: Abnormal   Collection Time: 02/14/17  5:03 PM  Result Value Ref Range   Glucose-Capillary 140 (H) 65 - 99 mg/dL  Glucose, capillary     Status: Abnormal   Collection Time: 02/14/17  8:23 PM  Result Value Ref Range   Glucose-Capillary 187 (H) 65 - 99 mg/dL  Glucose, capillary     Status: Abnormal    Collection Time: 02/15/17  8:00 AM  Result Value Ref Range   Glucose-Capillary 122 (H) 65 - 99 mg/dL  Glucose, capillary     Status: Abnormal   Collection Time: 02/15/17 11:47 AM  Result Value Ref Range   Glucose-Capillary 147 (H) 65 - 99 mg/dL  Glucose, capillary     Status: Abnormal   Collection Time: 02/15/17  5:23 PM  Result Value Ref Range   Glucose-Capillary 135 (H) 65 - 99 mg/dL  Glucose, capillary     Status: Abnormal   Collection Time: 02/15/17  8:29 PM  Result Value Ref Range   Glucose-Capillary 114 (H) 65 - 99 mg/dL    Current Facility-Administered Medications  Medication Dose Route Frequency Provider Last Rate Last Dose  . 0.9 %  sodium chloride infusion  250 mL Intravenous PRN Demetrios Loll, MD      . acetaminophen (TYLENOL) tablet 650 mg  650 mg Oral Q6H PRN Bettey Costa, MD   650 mg at 02/15/17 1020  . albuterol (PROVENTIL) (2.5 MG/3ML) 0.083% nebulizer solution 2.5 mg  2.5 mg Nebulization Q2H PRN Demetrios Loll, MD   2.5 mg at 02/15/17 0143  . ALPRAZolam Duanne Moron) tablet 0.5 mg  0.5 mg Oral TID PRN Bettey Costa, MD   0.5 mg at 02/15/17 1601  . bisacodyl (DULCOLAX) suppository 10 mg  10 mg Rectal Daily PRN Demetrios Loll, MD      . bisoprolol-hydrochlorothiazide [facility]) 10-6.25 MG per tablet 1 tablet  1 tablet Oral Daily Demetrios Loll, MD   1 tablet at 02/15/17 0900  . budesonide (PULMICORT) nebulizer solution 0.25 mg  0.25 mg Nebulization Q6H Wilhelmina Mcardle, MD  0.25 mg at 02/15/17 1927  . cefTRIAXone (ROCEPHIN) 1 g in dextrose 5 % 50 mL IVPB  1 g Intravenous Q24H Wilhelmina Mcardle, MD   Stopped at 02/15/17 [id number]  . chlorhexidine (PERIDEX) 0.12 % solution 15 mL  15 mL Mouth Rinse BID Demetrios Loll, MD   15 mL at 02/15/17 0901  . enoxaparin (LOVENOX) injection 40 mg  40 mg Subcutaneous Q12H Sital Mody, MD   40 mg at 02/15/17 0901  . furosemide (LASIX) tablet 40 mg  40 mg Oral Daily Demetrios Loll, MD   40 mg at 02/15/17 0900  . guaiFENesin-dextromethorphan (ROBITUSSIN DM) 100-10 MG/5ML syrup 5 mL  5 mL  Oral Q4H PRN Demetrios Loll, MD   5 mL at 02/15/17 1020  . HYDROcodone-acetaminophen (NORCO/VICODIN) 5-325 MG per tablet 1 tablet  1 tablet Oral Q8H PRN Bettey Costa, MD   1 tablet at 02/15/17 0858  . insulin aspart (novoLOG) injection 0-15 Units  0-15 Units Subcutaneous TID WC Sital Mody, MD      . ipratropium-albuterol (DUONEB) 0.5-2.5 (3) MG/3ML nebulizer solution 3 mL  3 mL Nebulization Q6H Demetrios Loll, MD   3 mL at 02/15/17 1927  . MEDLINE mouth rinse  15 mL Mouth Rinse q12n4p Demetrios Loll, MD   15 mL at 02/15/17 1600  . methylPREDNISolone sodium succinate (SOLU-MEDROL) 40 mg/mL injection 40 mg  40 mg Intravenous Q12H Wilhelmina Mcardle, MD   40 mg at 02/15/17 1813  . nicotine (NICODERM CQ - dosed in mg/24 hours) patch 14 mg  14 mg Transdermal Daily Demetrios Loll, MD   14 mg at 02/15/17 0906  . ondansetron (ZOFRAN) tablet 4 mg  4 mg Oral Q6H PRN Demetrios Loll, MD       Or  . ondansetron [facility]) injection 4 mg  4 mg Intravenous Q6H PRN Demetrios Loll, MD      . potassium chloride SA (K-DUR,KLOR-CON) CR tablet 20 mEq  20 mEq Oral Daily Demetrios Loll, MD   20 mEq at 02/15/17 0900  . roflumilast (DALIRESP) tablet 500 mcg  500 mcg Oral Daily Demetrios Loll, MD   500 mcg at 02/15/17 0900  . senna-docusate (Senokot-S) tablet 1 tablet  1 tablet Oral QHS PRN Demetrios Loll, MD      . sertraline (ZOLOFT) tablet 100 mg  100 mg Oral Daily Demetrios Loll, MD   100 mg at 02/15/17 0900  . sodium chloride flush (NS) 0.9 % injection 3 mL  3 mL Intravenous Q12H Demetrios Loll, MD   3 mL at 02/15/17 0901  . sodium chloride flush (NS) 0.9 % injection 3 mL  3 mL Intravenous PRN Demetrios Loll, MD   3 mL at 02/14/17 0944  . traZODone (DESYREL) tablet 100 mg  100 mg Oral QHS Gonzella Lex, MD   100 mg at 02/14/17 2246    Musculoskeletal: Strength & Muscle Tone: decreased Gait & Station: unsteady Patient leans: N/A  Psychiatric Specialty Exam: Physical Exam  Nursing note and vitals reviewed. Constitutional: She appears well-developed and well-nourished.  HENT:   Head: Normocephalic and atraumatic.  Eyes: Conjunctivae are normal. Pupils are equal, round, and reactive to light.  Neck: Normal range of motion.  Cardiovascular: Regular rhythm and normal heart sounds.   Respiratory: She is in respiratory distress. She has wheezes.  GI: Soft.  Musculoskeletal: Normal range of motion.  Neurological: She is alert.  Skin: Skin is warm and dry.  Psychiatric: Judgment normal. Her mood appears anxious. Her speech is delayed. She is  slowed. Thought content is not paranoid. Cognition and memory are not impaired. She expresses no homicidal and no suicidal ideation. She exhibits normal recent memory.    Review of Systems  Constitutional: Negative.   HENT: Negative.   Eyes: Negative.   Respiratory: Positive for shortness of breath.   Cardiovascular: Negative.   Gastrointestinal: Negative.   Musculoskeletal: Negative.   Skin: Negative.   Neurological: Negative.   Psychiatric/Behavioral: Negative for depression, hallucinations, memory loss, substance abuse and suicidal ideas. The patient is nervous/anxious and has insomnia.     Blood pressure 130/70, pulse 75, temperature 97.8 F (36.6 C), temperature source Oral, resp. rate 20, height _0  (1.651 m), weight 113.4 kg (250 lb), SpO2 92 %.Body mass index is 41.6 kg/m.  General Appearance: Fairly Groomed  Eye Contact:  Fair  Speech:  Slow  Volume:  Decreased  Mood:  Anxious and Dysphoric  Affect:  Congruent  Thought Process:  Goal Directed  Orientation:  Full (Time, Place, and [name])  Thought Content:  Logical  Suicidal Thoughts:  No  Homicidal Thoughts:  No  Memory:  Immediate;   Good Recent;   Fair Remote;   Fair  Judgement:  Fair  Insight:  Fair  Psychomotor Activity:  Decreased  Concentration:  Concentration: Fair  Recall:  [employer] of Knowledge:  Fair  Language:  Fair  Akathisia:  No  Handed:  Right  AIMS (if indicated):     Assets:  Communication Skills Desire for Improvement Social  Support  ADL's:  Impaired  Cognition:  Impaired,  Mild  Sleep:        Treatment Plan Summary: Daily contact with patient to assess and evaluate symptoms and progress in treatment, Medication management and Plan Supportive therapy again along with encouragement and recommendation that she seek counseling. No change to medication. Follow-up as needed.  Disposition: Patient does not meet criteria for psychiatric inpatient admission. Supportive therapy provided about ongoing stressors. Discussed crisis plan, support from social network, calling 911, coming to the Emergency Department, and calling Suicide Hotline.  Alethia Berthold, MD 02/15/2017 8:45 PM

## 2017-02-15 NOTE — Care Management (Addendum)
Called and confirmed that patient cell phone [phone]  Is a working number.  Patient states that family member to bring portable tank for discharge

## 2017-02-15 NOTE — Care Management Important Message (Signed)
Important Message  Patient Details  Name: Theresa [name] MRN: [id number] Date of Birth: 02-24-1956   Medicare Important Message Given:  Yes    Beverly Sessions, RN 02/15/2017, 2:07 PM

## 2017-02-15 NOTE — Progress Notes (Signed)
[facility] NAME: Theresa [name]    MR#:  [id number]  DATE OF BIRTH:  [birth year]  SUBJECTIVE:   Patient reports decreased shortness of breath and wheezing this morning REVIEW OF SYSTEMS:    Review of Systems  Constitutional: Negative for fever, chills weight loss HENT: Negative for ear pain, nosebleeds, congestion, facial swelling, rhinorrhea, neck pain, neck stiffness and ear discharge.   Respiratory: Positive for cough, shortness of breath, and  Positive wheezing  Cardiovascular: Negative for chest pain, palpitations and leg swelling.  Gastrointestinal: Negative for heartburn, abdominal pain, vomiting, diarrhea or consitpation Genitourinary: Negative for dysuria, urgency, frequency, hematuria Musculoskeletal: Negative for back pain or joint pain Neurological: Negative for dizziness, seizures, syncope, focal weakness,  numbness and headaches.  Hematological: Does not bruise/bleed easily.  Psychiatric/Behavioral: Negative for hallucinations, confusion,  positive dysphoric mood    Tolerating Diet: yes      DRUG ALLERGIES:   Allergies  Allergen Reactions  . Codeine Nausea And Vomiting  . Meloxicam Other (See Comments)    Stomach pain    VITALS:  Blood pressure 131/64, pulse 76, temperature 98.6 F (37 C), temperature source Oral, resp. rate 20, height _0  (1.651 m), weight 113.4 kg (250 lb), SpO2 93 %.  PHYSICAL EXAMINATION:  Constitutional: Appears well-developed and well-nourished. No distress. HENT: Normocephalic. Marland Kitchen Oropharynx is clear and moist.  Eyes: Conjunctivae and EOM are normal. PERRLA, no scleral icterus.  Neck: Normal ROM. Neck supple. No JVD. No tracheal deviation. CVS: RRR, S1/S2 +, no murmurs, no gallops, no carotid bruit.  Pulmonary: Normal effort with bilateral wheezing However improved good airflow Abdominal: Soft. BS +,  no distension, tenderness, rebound or guarding.  Musculoskeletal: Normal  range of motion. No edema and no tenderness.  Neuro: Alert. CN 2-12 grossly intact. No focal deficits. Skin: Skin is warm and dry. No rash noted. Psychiatric: More positive affect this morning    LABORATORY PANEL:   CBC  Recent Labs Lab 02/14/17 0527  WBC 11.5*  HGB 14.0  HCT 43.6  PLT 134*   ------------------------------------------------------------------------------------------------------------------  Chemistries   Recent Labs Lab 02/13/17 1639 02/14/17 0527  NA 144 145  K 3.7 3.8  CL 107 104  CO2 29 32  GLUCOSE 118* 153*  BUN 14 19  CREATININE 0.81 0.73  CALCIUM 8.9 9.1  MG 2.1  --    ------------------------------------------------------------------------------------------------------------------  Cardiac Enzymes  Recent Labs Lab 02/13/17 1639  TROPONINI <0.03   ------------------------------------------------------------------------------------------------------------------  RADIOLOGY:  Dg Chest 1 View  Result Date: 02/13/2017 CLINICAL DATA:  Shortness of breath and chest pain EXAM: CHEST 1 VIEW COMPARISON:  10/28/2016 FINDINGS: Chronic cardiomegaly and pulmonary artery enlargement, best seen at the [facility] and confirmed on January 2018 CT. Mild atelectasis or scarring bases. Streaky opacities on prior have improved. Chronic hyperinflation. No air bronchogram or pneumothorax. IMPRESSION: 1. No acute finding when compared to prior. 2. COPD and mild lung scarring. 3. Chronic cardiomegaly and pulmonary artery enlargement. Electronically Signed   By: Monte Fantasia M.D.   On: 02/13/2017 17:05   Dg Chest Port 1 View  Result Date: 02/14/2017 CLINICAL DATA:  Respiratory failure. EXAM: PORTABLE CHEST 1 VIEW COMPARISON:  02/13/2017, 11/02/2015.  He chest 10/24/2016. FINDINGS: Mediastinum and hilar structures are normal. Stable cardiomegaly. Prominent central pulmonary arteries are again noted consistent pulmonary hypertension. No pulmonary venous congestion.  Persistent low lung volumes. Mild bibasilar infiltrates cannot be excluded on today's exam. Small left pleural effusion cannot be excluded  on today's exam. No pneumothorax. IMPRESSION: 1. Stable cardiomegaly. Prominent central pulmonary arteries again noted consistent with pulmonary hypertension. No pulmonary venous congestion. 2. Persistent low lung volumes. Mild bibasilar infiltrates and small left pleural effusion cannot be excluded on today's exam. Electronically Signed   By: Marcello Moores  Register   On: 02/14/2017 06:50     ASSESSMENT AND PLAN:   61 year old female with chronic respiratory failure on 2 L of oxygen who presents with acute on chronic hypoxic respiratory failure in the setting of acute exacerbation of COPD.  1. Acute on chronic hypoxic respiratory failure in the setting of acute COPD exacerbation Patient Has been weaned off of BiPAP and on baseline of 2 L oxygen.  2. Acute exacerbation of COPD: Patient's wheezing is improved. Continue IV steroids, nebs and inhalers   3.Tobacco dependence: Patient is encouraged to quit smoking.  4. Depression with recent loss of daughter at [facility] stage Psychiatry consultation appreciated. She will continue Zoloft and increased dose of trazodone  5. Chronic diastolic heart failure: Continue Lasix  No signs of exacerbation at this time.  Management plans discussed with the patient and she is in agreement.  CODE STATUS: full  TOTAL TIME TAKING CARE OF THIS PATIENT:  26 minutes.     POSSIBLE D/C tomorrow, DEPENDING ON CLINICAL CONDITION.   Friend Dorfman M.D on 02/15/2017 at 8:55 AM  Between 7am to 6pm - Pager - [phone] After 6pm go to [email or web address] - password [email or web address] [facility]  Office  [phone]  CC: Primary care physician; [name], PA  Note: This dictation was prepared with Dragon dictation along with smaller phrase technology. Any transcriptional errors that result from this process are  unintentional.

## 2017-02-16 ENCOUNTER — Ambulatory Visit: Payer: Medicare PPO | Attending: Pain Medicine | Admitting: Pain Medicine

## 2017-02-16 VITALS — BP 129/71 | HR 78 | Temp 98.2°F | Resp 18 | Ht 63.0 in | Wt 254.0 lb

## 2017-02-16 DIAGNOSIS — M25512 Pain in left shoulder: Secondary | ICD-10-CM | POA: Insufficient documentation

## 2017-02-16 DIAGNOSIS — F1190 Opioid use, unspecified, uncomplicated: Secondary | ICD-10-CM

## 2017-02-16 DIAGNOSIS — J449 Chronic obstructive pulmonary disease, unspecified: Secondary | ICD-10-CM | POA: Insufficient documentation

## 2017-02-16 DIAGNOSIS — G5603 Carpal tunnel syndrome, bilateral upper limbs: Secondary | ICD-10-CM | POA: Insufficient documentation

## 2017-02-16 DIAGNOSIS — Z79891 Long term (current) use of opiate analgesic: Secondary | ICD-10-CM | POA: Insufficient documentation

## 2017-02-16 DIAGNOSIS — M79605 Pain in left leg: Secondary | ICD-10-CM | POA: Diagnosis not present

## 2017-02-16 DIAGNOSIS — Z9981 Dependence on supplemental oxygen: Secondary | ICD-10-CM | POA: Insufficient documentation

## 2017-02-16 DIAGNOSIS — M25511 Pain in right shoulder: Secondary | ICD-10-CM | POA: Insufficient documentation

## 2017-02-16 DIAGNOSIS — M25551 Pain in right hip: Secondary | ICD-10-CM | POA: Diagnosis not present

## 2017-02-16 DIAGNOSIS — M5416 Radiculopathy, lumbar region: Secondary | ICD-10-CM

## 2017-02-16 DIAGNOSIS — I110 Hypertensive heart disease with heart failure: Secondary | ICD-10-CM | POA: Insufficient documentation

## 2017-02-16 DIAGNOSIS — J9601 Acute respiratory failure with hypoxia: Secondary | ICD-10-CM | POA: Insufficient documentation

## 2017-02-16 DIAGNOSIS — M4696 Unspecified inflammatory spondylopathy, lumbar region: Secondary | ICD-10-CM

## 2017-02-16 DIAGNOSIS — G894 Chronic pain syndrome: Secondary | ICD-10-CM | POA: Insufficient documentation

## 2017-02-16 DIAGNOSIS — I5032 Chronic diastolic (congestive) heart failure: Secondary | ICD-10-CM | POA: Insufficient documentation

## 2017-02-16 DIAGNOSIS — M1610 Unilateral primary osteoarthritis, unspecified hip: Secondary | ICD-10-CM | POA: Insufficient documentation

## 2017-02-16 DIAGNOSIS — M545 Low back pain, unspecified: Secondary | ICD-10-CM

## 2017-02-16 DIAGNOSIS — M5116 Intervertebral disc disorders with radiculopathy, lumbar region: Secondary | ICD-10-CM | POA: Insufficient documentation

## 2017-02-16 DIAGNOSIS — M25559 Pain in unspecified hip: Secondary | ICD-10-CM

## 2017-02-16 DIAGNOSIS — Z885 Allergy status to narcotic agent status: Secondary | ICD-10-CM | POA: Insufficient documentation

## 2017-02-16 DIAGNOSIS — M25552 Pain in left hip: Secondary | ICD-10-CM | POA: Diagnosis not present

## 2017-02-16 DIAGNOSIS — F329 Major depressive disorder, single episode, unspecified: Secondary | ICD-10-CM | POA: Insufficient documentation

## 2017-02-16 DIAGNOSIS — Z79899 Other long term (current) drug therapy: Secondary | ICD-10-CM | POA: Insufficient documentation

## 2017-02-16 DIAGNOSIS — M48061 Spinal stenosis, lumbar region without neurogenic claudication: Secondary | ICD-10-CM | POA: Insufficient documentation

## 2017-02-16 DIAGNOSIS — E559 Vitamin D deficiency, unspecified: Secondary | ICD-10-CM | POA: Insufficient documentation

## 2017-02-16 DIAGNOSIS — G4730 Sleep apnea, unspecified: Secondary | ICD-10-CM | POA: Insufficient documentation

## 2017-02-16 DIAGNOSIS — G8929 Other chronic pain: Secondary | ICD-10-CM

## 2017-02-16 DIAGNOSIS — F17210 Nicotine dependence, cigarettes, uncomplicated: Secondary | ICD-10-CM | POA: Insufficient documentation

## 2017-02-16 DIAGNOSIS — R51 Headache: Secondary | ICD-10-CM | POA: Insufficient documentation

## 2017-02-16 DIAGNOSIS — Z6841 Body Mass Index (BMI) 40.0 and over, adult: Secondary | ICD-10-CM | POA: Insufficient documentation

## 2017-02-16 DIAGNOSIS — M47816 Spondylosis without myelopathy or radiculopathy, lumbar region: Secondary | ICD-10-CM

## 2017-02-16 DIAGNOSIS — Z7982 Long term (current) use of aspirin: Secondary | ICD-10-CM | POA: Insufficient documentation

## 2017-02-16 LAB — CULTURE, RESPIRATORY W GRAM STAIN

## 2017-02-16 MED ORDER — NICOTINE POLACRILEX 2 MG MT GUM: 2.0000 mg | CHEWING_GUM | OROMUCOSAL | 0 refills | Status: DC | PRN

## 2017-02-16 MED ORDER — DEXTROMETHORPHAN-GUAIFENESIN 10-100 MG/5ML PO LIQD: 5.0000 mL | ORAL | 0 refills | Status: DC | PRN

## 2017-02-16 MED ORDER — LEVOFLOXACIN 750 MG PO TABS: 750.0000 mg | ORAL_TABLET | Freq: Every day | ORAL | 0 refills | Status: DC

## 2017-02-16 MED ORDER — HYDROCODONE-ACETAMINOPHEN 5-325 MG PO TABS: 1.0000 | ORAL_TABLET | Freq: Three times a day (TID) | ORAL | 0 refills | Status: DC | PRN

## 2017-02-16 MED ORDER — TRAZODONE HCL 100 MG PO TABS: 100.0000 mg | ORAL_TABLET | Freq: Every day | ORAL | 0 refills | Status: DC

## 2017-02-16 MED ORDER — PREDNISONE 50 MG PO TABS: 50.0000 mg | ORAL_TABLET | Freq: Every day | ORAL | 0 refills | Status: AC

## 2017-02-16 NOTE — Care Management (Signed)
Patient to discharge home today.  Spoke with patient who agreed to home health services for through [facility] again.  Referral called to [facility] with Alvis Lemmings, and updated with patient's new address as documented by Marshell Garfinkel.  Home health order placed for RN.  RNCM signing off.

## 2017-02-16 NOTE — Patient Instructions (Signed)
Pain Score  Introduction: The pain score used by this practice is the Verbal Numerical Rating Scale (VNRS-11). This is an 11-point scale. It is for adults and children 10 years or older. There are significant differences in how the pain score is reported, used, and applied. Forget everything you learned in the past and learn this scoring system.  General Information: The scale should reflect your current level of pain. Unless you are specifically asked for the level of your worst pain, or your average pain. If you are asked for one of these two, then it should be understood that it is over the past 24 hours.  Basic Activities of Daily Living (ADL): Personal hygiene, dressing, eating, transferring, and using restroom.  Instructions: Most patients tend to report their level of pain as a combination of two factors, their physical pain and their psychosocial pain. This last one is also known as "suffering" and it is reflection of how physical pain affects you socially and psychologically. From now on, report them separately. From this point on, when asked to report your pain level, report only your physical pain. Use the following table for reference.  Pain Clinic Pain Levels (0-5/10)  Pain Level Score Description  No Pain 0   Mild pain 1 Nagging, annoying, but does not interfere with basic activities of daily living (ADL). Patients are able to eat, bathe, get dressed, toileting (being able to get on and off the toilet and perform personal hygiene functions), transfer (move in and out of bed or a chair without assistance), and maintain continence (able to control bladder and bowel functions). Blood pressure and heart rate are unaffected. A normal heart rate for a healthy adult ranges from 60 to 100 bpm (beats per minute).   Mild to moderate pain 2 Noticeable and distracting. Impossible to hide from other people. More frequent flare-ups. Still possible to adapt and function close to normal. It can be very  annoying and may have occasional stronger flare-ups. With discipline, patients may get used to it and adapt.   Moderate pain 3 Interferes significantly with activities of daily living (ADL). It becomes difficult to feed, bathe, get dressed, get on and off the toilet or to perform personal hygiene functions. Difficult to get in and out of bed or a chair without assistance. Very distracting. With effort, it can be ignored when deeply involved in activities.   Moderately severe pain 4 Impossible to ignore for more than a few minutes. With effort, patients may still be able to manage work or participate in some social activities. Very difficult to concentrate. Signs of autonomic nervous system discharge are evident: dilated pupils (mydriasis); mild sweating (diaphoresis); sleep interference. Heart rate becomes elevated (>115 bpm). Diastolic blood pressure (lower number) rises above 100 mmHg. Patients find relief in laying down and not moving.   Severe pain 5 Intense and extremely unpleasant. Associated with frowning face and frequent crying. Pain overwhelms the senses.  Ability to do any activity or maintain social relationships becomes significantly limited. Conversation becomes difficult. Pacing back and forth is common, as getting into a comfortable position is nearly impossible. Pain wakes you up from deep sleep. Physical signs will be obvious: pupillary dilation; increased sweating; goosebumps; brisk reflexes; cold, clammy hands and feet; nausea, vomiting or dry heaves; loss of appetite; significant sleep disturbance with inability to fall asleep or to remain asleep. When persistent, significant weight loss is observed due to the complete loss of appetite and sleep deprivation.  Blood pressure and heart  rate becomes significantly elevated. Caution: If elevated blood pressure triggers a pounding headache associated with blurred vision, then the patient should immediately seek attention at an urgent or  emergency care unit, as these may be signs of an impending stroke.    Emergency Department Pain Levels (6-10/10)  Emergency Room Pain 6 Severely limiting. Requires emergency care and should not be seen or managed at an outpatient pain management facility. Communication becomes difficult and requires great effort. Assistance to reach the emergency department may be required. Facial flushing and profuse sweating along with potentially dangerous increases in heart rate and blood pressure will be evident.   Distressing pain 7 Self-care is very difficult. Assistance is required to transport, or use restroom. Assistance to reach the emergency department will be required. Tasks requiring coordination, such as bathing and getting dressed become very difficult.   Disabling pain 8 Self-care is no longer possible. At this level, pain is disabling. The individual is unable to do even the most "basic" activities such as walking, eating, bathing, dressing, transferring to a bed, or toileting. Fine motor skills are lost. It is difficult to think clearly.   Incapacitating pain 9 Pain becomes incapacitating. Thought processing is no longer possible. Difficult to remember your own name. Control of movement and coordination are lost.   The worst pain imaginable 10 At this level, most patients pass out from pain. When this level is reached, collapse of the autonomic nervous system occurs, leading to a sudden drop in blood pressure and heart rate. This in turn results in a temporary and dramatic drop in blood flow to the brain, leading to a loss of consciousness. Fainting is one of the body's self defense mechanisms. Passing out puts the brain in a calmed state and causes it to shut down for a while, in order to begin the healing process.    Summary: 1. Refer to this scale when providing [place] with your pain level. 2. Be accurate and careful when reporting your pain level. This will help with your care. 3. Over-reporting  your pain level will lead to loss of credibility. 4. Even a level of 1/10 means that there is pain and will be treated at our facility. 5. High, inaccurate reporting will be documented as "Symptom Exaggeration", leading to loss of credibility and suspicions of possible secondary gains such as obtaining more narcotics, or wanting to appear disabled, for fraudulent reasons. 6. Only pain levels of 5 or below will be seen at our facility. 7. Pain levels of 6 and above will be sent to the Emergency Department and the appointment cancelled. _____________________________________________________________________________________________

## 2017-02-16 NOTE — Discharge Instructions (Addendum)
Acute Respiratory Distress Syndrome, Adult Acute respiratory distress syndrome is a life-threatening condition in which fluid collects in the lungs. This prevents the lungs from filling with air and passing oxygen into the blood. This can cause the lungs and other vital organs to fail. The condition usually develops following an infection, illness, surgery, or injury. What are the causes? This condition may be caused by:  An infection, such as sepsis or pneumonia.  A serious injury to the head or chest.  Severe bleeding from an injury.  A major surgery.  Breathing in harmful chemicals or smoke.  Blood transfusions.  A blood clot in the lungs.  Breathing in vomit (aspiration).  Near-drowning.  Inflammation of the pancreas (pancreatitis).  A drug overdose. What are the signs or symptoms? Sudden shortness of breath and rapid breathing are the main symptoms of this condition. Other symptoms may include:  A fast or irregular heartbeat.  Skin, lips, or fingernails that look blue (cyanosis).  Confusion.  Tiredness or loss of energy.  Chest pain, particularly while taking a breath.  Coughing.  Restlessness or anxiety.  Fever. This is usually present if there is an underlying infection, such as pneumonia. How is this diagnosed? This condition is diagnosed based on:  Your symptoms.  Medical history.  A physical exam. During the exam, your health care provider will listen to your heart and check for crackling or wheezing sounds in your lungs. You may also have other tests to confirm the diagnosis and measure how well your lungs are working. These may include:  Measuring the amount of oxygen in your blood. Your health care provider will use two methods to do this procedure:  A small device (pulse oximeter) that is placed on your finger, earlobe, or toe.  An arterial blood gas test. A sample of blood is taken from an artery and tested for oxygen levels.  Blood  tests.  Chest X-rays or CT scans to look for fluid in the lungs.  Taking a sample of your sputum to test for infection.  Heart test, such as an echocardiogram or electrocardiogram. This is done to rule out any heart problems (such as heart failure) that may be causing your symptoms.  Bronchoscopy. During this test, a thin, flexible tube with a light is passed into the mouth or nose, down the windpipe, and into the lungs. How is this treated? Treatment depends on the cause of your condition. The goal is to support you while your lungs heal and the underlying cause is treated. Treatment may include:  Oxygen therapy. This may be done through:  A tube in your nose or a face mask.  A ventilator. This device helps move air into and out of your lungs through a breathing tube that is inserted into your mouth or nose.  Continuous positive airway pressure (CPAP). This treatment uses mild air pressure to keep the airways open. A mask or other device will be placed over your nose or mouth.  Tracheostomy. During this procedure, a small cut is made in your neck to create an opening to your windpipe. A breathing tube is placed directly into your windpipe. The breathing tube is connected to a ventilator. This is done if you have problems with your airway or if you need a ventilator for a long period of time.  Positioning you to lie on your stomach (prone position).  Medicines, such as:  Sedatives to help you relax.  Blood pressure medicines.  Antibiotics to treat infection.  Blood thinners  to prevent blood clots.  Diuretics to help prevent excess fluid.  Fluids and nutrients given through an IV tube.  Wearing compression stockings on your legs to prevent blood clots.  Extra corporeal membrane oxygenation (ECMO). This treatment takes blood outside your body, adds oxygen, and removes carbon dioxide. The blood is then returned to your body. This treatment is only used in severe cases. Follow  these instructions at home:  Take over-the-counter and prescription medicines only as told by your health care provider.  Do not use any products that contain nicotine or tobacco, such as cigarettes and e-cigarettes. If you need help quitting, ask your health care provider.  Limit alcohol intake to no more than 1 drink per day for nonpregnant women and 2 drinks per day for men. One drink equals 12 oz of beer, 5 oz of wine, or 1 oz of hard liquor.  Ask friends and family to help you if daily activities make you tired.  Attend any pulmonary rehabilitation as told by your health care provider. This may include:  Education about your condition.  Exercises.  Breathing training.  Counseling.  Learning techniques to conserve energy.  Nutrition counseling.  Keep all follow-up visits as told by your health care provider. This is important. Contact a health care provider if:  You become short of breath during activity or while resting.  You develop a cough that does not go away.  You have a fever.  Your symptoms do not get better or they get worse.  You become anxious or depressed. Get help right away if:  You have sudden shortness of breath.  You develop sudden chest pain that does not go away.  You develop a rapid heart rate.  You develop swelling or pain in one of your legs.  You cough up blood.  You have trouble breathing.  Your skin, lips, or fingernails turn blue. These symptoms may represent a serious problem that is an emergency. Do not wait to see if the symptoms will go away. Get medical help right away. Call your local emergency services (911 in the [place] Do not drive yourself to the hospital. Summary  Acute respiratory distress syndrome is a life-threatening condition in which fluid collects in the lungs, which leads the lungs and other vital organs to fail.  This condition usually develops following an infection, illness, surgery, or injury.  Sudden  shortness of breath and rapid breathing are the main symptoms of acute respiratory distress syndrome.  Treatment may include oxygen therapy, continuous positive airway pressure (CPAP), tracheostomy, lying on your stomach (prone position), medicines, fluids and nutrients given through an IV tube, compression stockings, and extra corporeal membrane oxygenation (ECMO). This information is not intended to replace advice given to you by your health care provider. Make sure you discuss any questions you have with your health care provider. Document Released: 10/03/2005 Document Revised: 09/19/2016 Document Reviewed: 09/19/2016 Elsevier Interactive Patient Education  2017 [employer].

## 2017-02-16 NOTE — Progress Notes (Signed)
Nursing Pain Medication Assessment:  Safety precautions to be maintained throughout the outpatient stay will include: orient to surroundings, keep bed in low position, maintain call bell within reach at all times, provide assistance with transfer out of bed and ambulation.  Medication Inspection Compliance: Theresa [name] did not comply with our request to bring her pills to be counted. She was reminded that bringing the medication bottles, even when empty, is a requirement.  Medication: None brought in. Pill/Patch Count: None available to be counted. Bottle Appearance: No container available. Did not bring bottle(s) to appointment. Filled Date: N/A Last Medication intake:  Today

## 2017-02-16 NOTE — Plan of Care (Signed)
MD making rounds. Received order to discharge home. IV removed. Patient provided with Education Handout. Prescriptions E-Scribed to pharmacy. Prescriptions given to patient.  Discharge paperwork provided, explained, signed and witnessed. No questions left unanswered Discharged via wheelchair by auxiliary staff. Belongings sent with patient and family.

## 2017-02-16 NOTE — Progress Notes (Signed)
Patient's Name: Theresa [name]  MRN: [id number]  Referring Provider: Margo Common, [place]  DOB: 1955/11/07  PCP: Margo Common, PA  DOS: 02/16/2017  Note by: Kathlen Brunswick. Dossie Arbour, MD  Service setting: Ambulatory outpatient  Specialty: Interventional Pain Management  Location: [facility] (AMB) Pain Management Facility    Patient type: Established   Primary Reason(s) for Visit: Encounter for prescription drug management (Level of risk: moderate) CC: Back Pain (left); Shoulder Pain (left ); and Headache (frontal )  HPI  Theresa [name] is a 61 y.o. year old, female patient, who comes today for a medication management evaluation. She has Neuritis or radiculitis due to rupture of lumbar intervertebral disc; Decreased motor strength; Essential (primary) hypertension; Clinical depression; DDD (degenerative disc disease), lumbar; CAFL (chronic airflow limitation) ([place]); Chronic rhinitis; Carpal tunnel syndrome; Blood clotting disorder ([place]); Anxiety state; Chronic diastolic heart failure ([place]); Chest pain; Tobacco abuse; Encounter for therapeutic drug level monitoring; Encounter for chronic pain management; Pain management; Neurogenic pain; Neuropathic pain; Musculoskeletal pain; Lumbar spondylosis (L4-5); Chronic low back pain (Location of Primary Source of Pain) (Left); Chronic lower extremity pain (Location of Secondary source of pain) (Left); Chronic lumbar radicular pain (Location of Secondary source of pain) (L4 & S1 Dermatome) (Left); Chronic shoulder pain (Right side); Chronic upper extremity pain (Right-sided); Cervical radiculitis (Right side); Abnormal x-ray of lumbar spine; Chronic pain syndrome (significant psychosocial component); 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; Chronic obstructive pulmonary disease ([place]); Coagulation disorder ([place]); Chronic hip pain (Location of Primary Source of Pain) (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); 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); Obesity, Class III, BMI  40-49.9 (morbid obesity) ([place]); 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[place]); Vitamin D deficiency; Long term current use of opiate analgesic; Long term prescription opiate use; Opiate use (15 MME/Day); Lumbar facet syndrome (Location of Primary Source of Pain) (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); Chronic sacroiliac joint pain (Bilateral); Sleep apnea; Supplemental oxygen dependent; Acute respiratory failure ([place]); Chronic hip pain (Location of Primary Source of Pain) (Left); Chronic hip pain (Location of Primary Source of Pain) (Right); Lumbar facet hypertrophy (multilevel); Lumbar spinal stenosis (L4-5); 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); 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 Her primarily concern today is the Back Pain (left); Shoulder Pain (left ); and Headache (frontal )  Pain Assessment: Self-Reported Pain Score: 5 /10 Clinically the patient looks like a 2/10 Reported level is inconsistent with clinical observations. Information on the proper use of the pain scale provided to the patient today Pain Type: Acute pain Pain Location: Back Pain Orientation: Lower, Mid, Left Pain Descriptors / Indicators: Aching Pain Frequency: Constant  Theresa [name] was last scheduled for an appointment on 11/17/2016 for medication management. During today's appointment we reviewed Theresa [name]'s chronic pain status, as well as her outpatient medication regimen. Sadly, the patient's daughter passed away unexpectedly. The patient is currently going going through some rough times secondary to the unexpected death of her daughter. She was recently hospitalized due to panic attacks.  The patient  reports that she does not use drugs. Her body mass index is 44.99 kg/m.  Further details on both, my assessment(s), as well as the proposed treatment plan, please see below.  Controlled  Substance Pharmacotherapy Assessment REMS (Risk Evaluation and Mitigation Strategy)  Analgesic:Hydrocodone/APAP 5/325 one tablet every 8 hours (15 mg/day of hydrocodone) MME/day:15 mg/day Ignatius Specking, RN  02/16/2017 11:39 AM  Sign at close encounter Nursing  Pain Medication Assessment:  Safety precautions to be maintained throughout the outpatient stay will include: orient to surroundings, keep bed in low position, maintain call bell within reach at all times, provide assistance with transfer out of bed and ambulation.  Medication Inspection Compliance: Theresa [name] did not comply with our request to bring her pills to be counted. She was reminded that bringing the medication bottles, even when empty, is a requirement.  Medication: None brought in. Pill/Patch Count: None available to be counted. Bottle Appearance: No container available. Did not bring bottle(s) to appointment. Filled Date: N/A Last Medication intake:  Today   Pharmacokinetics: Liberation and absorption (onset of action): WNL Distribution (time to peak effect): WNL Metabolism and excretion (duration of action): WNL         Pharmacodynamics: Desired effects: Analgesia: Theresa [name] reports >50% benefit. Functional ability: Patient reports that medication allows her to accomplish basic ADLs Clinically meaningful improvement in function (CMIF): Sustained CMIF goals met Perceived effectiveness: Described as relatively effective, allowing for increase in activities of daily living (ADL) Undesirable effects: Side-effects or Adverse reactions: None reported Monitoring: [place] PMP: Online review of the past 20-monthperiod conducted. Compliant with practice rules and regulations List of all UDS test(s) done:  Lab Results  Component Value Date   TOXASSSELUR FINAL 03/01/2016   TOXASSSELUR FINAL 01/12/2016   TOXASSSELUR FINAL 09/16/2015   Last UDS on record: ToxAssure Select 13  Date Value Ref Range Status  03/01/2016 FINAL   Final    Comment:    ==================================================================== TOXASSURE SELECT 13 (MW) ==================================================================== Test                             Result       Flag       Units Drug Present and Declared for Prescription Verification   Hydrocodone                    863          EXPECTED   ng/mg creat   Hydromorphone                  195          EXPECTED   ng/mg creat   Dihydrocodeine                 175          EXPECTED   ng/mg creat   Norhydrocodone                 1078         EXPECTED   ng/mg creat    Sources of hydrocodone include scheduled prescription    medications. Hydromorphone, dihydrocodeine and norhydrocodone are    expected metabolites of hydrocodone. Hydromorphone and    dihydrocodeine are also available as scheduled prescription    medications. Drug Absent but Declared for Prescription Verification   Alprazolam                     Not Detected UNEXPECTED ng/mg creat ==================================================================== Test                      Result    Flag   Units      Ref Range   Creatinine              40  mg/dL      >=20 ==================================================================== Declared Medications:  The flagging and interpretation on this report are based on the  following declared medications.  Unexpected results may arise from  inaccuracies in the declared medications.  **Note: The testing scope of this panel includes these medications:  Alprazolam (Xanax)  Hydrocodone (Norco)  **Note: The testing scope of this panel does not include following  reported medications:  Acetaminophen (Norco)  Albuterol (Duoneb)  Albuterol (Proventil)  Amoxicillin  Aspirin  Bisoprolol (Ziac)  Caffeine  Cholecalciferol  Fluticasone (Advair)  Furosemide (Lasix)  Hydrochlorothiazide (Ziac)  Ipratropium (Duoneb)  Oxygen  Roflumilast (Daliresp)  Salmeterol  (Advair)  Sertraline (Zoloft)  Tiotropium (Spiriva) ==================================================================== For clinical consultation, please call [phone]. ====================================================================    UDS interpretation: Compliant          Medication Assessment Form: Reviewed. Patient indicates being compliant with therapy Treatment compliance: Compliant Risk Assessment Profile: Aberrant behavior: See prior evaluations. None observed or detected today Comorbid factors increasing risk of overdose: See prior notes. No additional risks detected today Risk of substance use disorder (SUD): Low Opioid Risk Tool (ORT) Total Score: 1  Interpretation Table:  Score <3 = Low Risk for SUD  Score between 4-7 = Moderate Risk for SUD  Score >8 = High Risk for Opioid Abuse   Risk Mitigation Strategies:  Patient Counseling: Covered Patient-Prescriber Agreement (PPA): Present and active  Notification to other healthcare providers: Done  Pharmacologic Plan: No change in therapy, at this time  Laboratory Chemistry  Inflammation Markers Lab Results  Component Value Date   CRP 1.1 (H) 09/17/2015   ESRSEDRATE 5 09/17/2015   (CRP: Acute Phase) (ESR: Chronic Phase) Renal Function Markers Lab Results  Component Value Date   BUN 19 02/14/2017   CREATININE 0.73 02/14/2017   GFRAA >60 02/14/2017   GFRNONAA >60 02/14/2017   Hepatic Function Markers Lab Results  Component Value Date   AST 21 10/24/2016   ALT 22 10/24/2016   ALBUMIN 3.5 10/24/2016   ALKPHOS 90 10/24/2016   Electrolytes Lab Results  Component Value Date   NA 145 02/14/2017   K 3.8 02/14/2017   CL 104 02/14/2017   CALCIUM 9.1 02/14/2017   MG 2.1 02/13/2017   Neuropathy Markers No results found for: [id number] Bone Pathology Markers Lab Results  Component Value Date   ALKPHOS 90 10/24/2016   25OHVITD1 13 (L) 09/17/2015   25OHVITD2 <1.0 09/17/2015   25OHVITD3 13  09/17/2015   CALCIUM 9.1 02/14/2017   Coagulation Parameters Lab Results  Component Value Date   PLT 134 (L) 02/14/2017   Cardiovascular Markers Lab Results  Component Value Date   BNP 161.0 (H) 02/13/2017   HGB 14.0 02/14/2017   HCT 43.6 02/14/2017   Note: Lab results reviewed.  Recent Diagnostic Imaging Review  Dg Chest 1 View  Result Date: 02/13/2017 CLINICAL DATA:  Shortness of breath and chest pain EXAM: CHEST 1 VIEW COMPARISON:  10/28/2016 FINDINGS: Chronic cardiomegaly and pulmonary artery enlargement, best seen at the [facility] and confirmed on January 2018 CT. Mild atelectasis or scarring bases. Streaky opacities on prior have improved. Chronic hyperinflation. No air bronchogram or pneumothorax. IMPRESSION: 1. No acute finding when compared to prior. 2. COPD and mild lung scarring. 3. Chronic cardiomegaly and pulmonary artery enlargement. Electronically Signed   By: Monte Fantasia M.D.   On: 02/13/2017 17:05   Dg Chest Port 1 View  Result Date: 02/14/2017 CLINICAL DATA:  Respiratory failure. EXAM: PORTABLE CHEST 1 VIEW COMPARISON:  02/13/2017,  11/02/2015.  He chest 10/24/2016. FINDINGS: Mediastinum and hilar structures are normal. Stable cardiomegaly. Prominent central pulmonary arteries are again noted consistent pulmonary hypertension. No pulmonary venous congestion. Persistent low lung volumes. Mild bibasilar infiltrates cannot be excluded on today's exam. Small left pleural effusion cannot be excluded on today's exam. No pneumothorax. IMPRESSION: 1. Stable cardiomegaly. Prominent central pulmonary arteries again noted consistent with pulmonary hypertension. No pulmonary venous congestion. 2. Persistent low lung volumes. Mild bibasilar infiltrates and small left pleural effusion cannot be excluded on today's exam. Electronically Signed   By: Marcello Moores  Register   On: 02/14/2017 06:50   Note: Imaging results reviewed.          Meds  The patient has a current medication list which  includes the following prescription(s): albuterol, alprazolam, aspirin-caffeine, bisoprolol-hydrochlorothiazide, cholecalciferol, dextromethorphan-guaifenesin, fluticasone-salmeterol, furosemide, hydrocodone-acetaminophen, hydrocodone-acetaminophen, hydrocodone-acetaminophen, ipratropium-albuterol, levofloxacin, menthol (topical analgesic), nicotine polacrilex, oxygen-helium, potassium chloride sa, prednisone, ranitidine, roflumilast, sertraline, and trazodone.  Current Outpatient Prescriptions on File Prior to Visit  Medication Sig  . albuterol (PROVENTIL HFA;VENTOLIN HFA) 108 (90 Base) MCG/ACT inhaler Inhale 2 puffs into the lungs every 6 (six) hours as needed for wheezing. Reported on 11/03/2015  . ALPRAZolam (XANAX) 0.5 MG tablet Take 1 tablet (0.5 mg total) by mouth 2 (two) times daily as needed for anxiety.  . Aspirin-Caffeine (BC FAST PAIN RELIEF ARTHRITIS) 1000-65 MG PACK Take 1 packet by mouth every 6 (six) hours as needed (Pain).  . bisoprolol-hydrochlorothiazide (ZIAC) 10-6.25 MG tablet TAKE 1 TABLET BY MOUTH DAILY  . Cholecalciferol ([employer] D3) 2000 units CAPS Take 1 capsule by mouth daily.  Marland Kitchen dextromethorphan-guaiFENesin (TUSSIN DM) 10-100 MG/5ML liquid Take 5 mLs by mouth every 4 (four) hours as needed for cough.  . Fluticasone-Salmeterol (ADVAIR DISKUS) 250-50 MCG/DOSE AEPB INHALE 1 PUFF INTO LUNGS TWICE A DAY  . furosemide (LASIX) 40 MG tablet Take 1 tablet (40 mg total) by mouth daily.  Marland Kitchen ipratropium-albuterol (DUONEB) 0.5-2.5 (3) MG/3ML SOLN Take 3 mLs by nebulization every 6 (six) hours as needed (shortness of breath).  Marland Kitchen levofloxacin (LEVAQUIN) 750 MG tablet Take 1 tablet (750 mg total) by mouth daily.  . Menthol, Topical Analgesic, (BENGAY COLD THERAPY) 5 % GEL Apply 1 application topically daily.  . nicotine polacrilex ([employer]S NICOTINE POLACRILEX) 2 MG gum Take 1 each (2 mg total) by mouth as needed for smoking cessation.  . OXYGEN Inhale 2.5 L into the lungs continuous. Reported on  11/03/2015  . potassium chloride SA (K-DUR,KLOR-CON) 20 MEQ tablet Take 1 tablet (20 mEq total) by mouth daily.  . predniSONE (DELTASONE) 50 MG tablet Take 1 tablet (50 mg total) by mouth daily with breakfast.  . ranitidine (ZANTAC) 150 MG tablet Take 150 mg by mouth 2 (two) times daily.  . roflumilast (DALIRESP) 500 MCG TABS tablet Take 500 mcg by mouth daily.  . sertraline (ZOLOFT) 100 MG tablet TAKE 1 TABLET (100 MG TOTAL) BY MOUTH DAILY.  . traZODone (DESYREL) 100 MG tablet Take 1 tablet (100 mg total) by mouth at bedtime.   No current facility-administered medications on file prior to visit.    ROS  Constitutional: Denies any fever or chills Gastrointestinal: No reported hemesis, hematochezia, vomiting, or acute GI distress Musculoskeletal: Denies any acute onset joint swelling, redness, loss of ROM, or weakness Neurological: No reported episodes of acute onset apraxia, aphasia, dysarthria, agnosia, amnesia, paralysis, loss of coordination, or loss of consciousness  Allergies  Theresa [name] is allergic to codeine and meloxicam.  PFSH  Drug: Theresa [name]  reports that she does not use drugs. Alcohol:  reports that she does not drink alcohol. Tobacco:  reports that she has been smoking Cigarettes.  She has a 40.00 pack-year smoking history. She has never used smokeless tobacco. Medical:  has a past medical history of Anxiety; Carpal tunnel syndrome; CHF (congestive heart failure) ([place]); CHF (congestive heart failure) ([place]); Chronic generalized abdominal pain; Chronic rhinitis; COPD (chronic obstructive pulmonary disease) ([facility]); DDD (degenerative disc disease), cervical; Depression; Edema; Flu; Gastritis; Hematuria; Hypertension; Kidney stones; Low back pain; Lumbar radiculitis; Malodorous urine; Muscle weakness; Obesity; Renal cyst; Sensory urge incontinence; and Tobacco abuse. Family: family history includes Alcohol abuse in her father; Coronary artery disease in her mother; Heart disease in  her father and mother; Lung cancer in her sister; Stroke in her mother.  Past Surgical History:  Procedure Laterality Date  . ABDOMINAL HYSTERECTOMY    . CARPAL TUNNEL RELEASE Left   . TONSILLECTOMY    . TUBAL LIGATION     Constitutional Exam  General appearance: Well nourished, well developed, and well hydrated. In no apparent acute distress Vitals:   02/16/17 1114  BP: 129/71  Pulse: 78  Resp: 18  Temp: 98.2 F (36.8 C)  TempSrc: Oral  SpO2: 93%  Weight: 254 lb (115.2 kg)  Height: _0  (1.6 m)   BMI Assessment: Estimated body mass index is 44.99 kg/m as calculated from the following:   Height as of this encounter: _1  (1.6 m).   Weight as of this encounter: 254 lb (115.2 kg).  BMI interpretation table: BMI level Category Range association with higher incidence of chronic pain  <18 kg/m2 Underweight   18.5-24.9 kg/m2 Ideal body weight   25-29.9 kg/m2 Overweight Increased incidence by 20%  30-34.9 kg/m2 Obese (Class I) Increased incidence by 68%  35-39.9 kg/m2 Severe obesity (Class II) Increased incidence by 136%  >40 kg/m2 Extreme obesity (Class III) Increased incidence by 254%   BMI Readings from Last 4 Encounters:  02/16/17 44.99 kg/m  02/13/17 41.60 kg/m  02/02/17 45.70 kg/m  01/11/17 45.26 kg/m   Wt Readings from Last 4 Encounters:  02/16/17 254 lb (115.2 kg)  02/13/17 250 lb (113.4 kg)  02/02/17 258 lb (117 kg)  01/11/17 255 lb 8 oz (115.9 kg)  Psych/Mental status: Alert, oriented x 3 (person, place, & time)       Eyes: PERLA Respiratory: Oxygen-dependent COPD  Cervical Spine Exam  Inspection: No masses, redness, or swelling Alignment: Symmetrical Functional ROM: Unrestricted ROM      Stability: No instability detected Muscle strength & Tone: Functionally intact Sensory: Unimpaired Palpation: No palpable anomalies              Upper Extremity (UE) Exam    Side: Right upper extremity  Side: Left upper extremity  Inspection: No masses,  redness, swelling, or asymmetry. No contractures  Inspection: No masses, redness, swelling, or asymmetry. No contractures  Functional ROM: Unrestricted ROM          Functional ROM: Unrestricted ROM          Muscle strength & Tone: Functionally intact  Muscle strength & Tone: Functionally intact  Sensory: Unimpaired  Sensory: Unimpaired  Palpation: No palpable anomalies              Palpation: No palpable anomalies              Specialized Test(s): Deferred         Specialized Test(s): Deferred  Thoracic Spine Exam  Inspection: No masses, redness, or swelling Alignment: Symmetrical Functional ROM: Unrestricted ROM Stability: No instability detected Sensory: Unimpaired Muscle strength & Tone: No palpable anomalies  Lumbar Spine Exam  Inspection: No masses, redness, or swelling Alignment: Symmetrical Functional ROM: Minimal ROM      Stability: No instability detected Muscle strength & Tone: Functionally intact Sensory: Movement-associated pain Palpation: Complains of area being tender to palpation       Provocative Tests: Lumbar Hyperextension and rotation test: evaluation deferred today       Patrick's Maneuver: evaluation deferred today                    Gait & Posture Assessment  Ambulation: Patient ambulates using a wheel chair Gait: Very limited, using assistive device to ambulate Posture: Poor   Lower Extremity Exam    Side: Right lower extremity  Side: Left lower extremity  Inspection: No masses, redness, swelling, or asymmetry. No contractures  Inspection: No masses, redness, swelling, or asymmetry. No contractures  Functional ROM: Unrestricted ROM          Functional ROM: Unrestricted ROM          Muscle strength & Tone: Functionally intact  Muscle strength & Tone: Functionally intact  Sensory: Unimpaired  Sensory: Unimpaired  Palpation: No palpable anomalies  Palpation: No palpable anomalies   Assessment  Primary Diagnosis & Pertinent Problem List: The  primary encounter diagnosis was Chronic hip pain, unspecified laterality. Diagnoses of Chronic hip pain (Location of Primary Source of Pain) (Left), Chronic hip pain (Location of Primary Source of Pain) (Right), Chronic low back pain (Location of Primary Source of Pain) (Left), Chronic lower extremity pain (Location of Secondary source of pain) (Left), Chronic lumbar radicular pain (Location of Secondary source of pain) (L4 & S1 Dermatome) (Left), Lumbar facet syndrome (Location of Primary Source of Pain) (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), Chronic pain syndrome (significant psychosocial component), Chronic pain syndrome, Long term prescription opiate use, and Opiate use (15 MME/Day) were also pertinent to this visit.  Status Diagnosis  Controlled Controlled Controlled 1. Chronic hip pain, unspecified laterality   2. Chronic hip pain (Location of Primary Source of Pain) (Left)   3. Chronic hip pain (Location of Primary Source of Pain) (Right)   4. Chronic low back pain (Location of Primary Source of Pain) (Left)   5. Chronic lower extremity pain (Location of Secondary source of pain) (Left)   6. Chronic lumbar radicular pain (Location of Secondary source of pain) (L4 & S1 Dermatome) (Left)   7. Lumbar facet syndrome (Location of Primary Source of Pain) (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)   8. Chronic pain syndrome (significant psychosocial component)   9. Chronic pain syndrome   10. Long term prescription opiate use   11. Opiate use (15 MME/Day)      Plan of Care  Pharmacotherapy (Medications Ordered): Meds ordered this encounter  Medications  . HYDROcodone-acetaminophen (NORCO/VICODIN) 5-325 MG tablet    Sig: Take 1 tablet by mouth every 8 (eight) hours as needed for severe pain.    Dispense:  90 tablet    Refill:  0    Do not add this medication to the electronic "Automatic Refill" notification system. Patient may have prescription filled one day early if pharmacy is closed on scheduled refill date. Do not fill until:  02/16/17 To last until: 03/18/17  . HYDROcodone-acetaminophen (NORCO/VICODIN) 5-325 MG tablet    Sig: Take 1 tablet by mouth every 8 (eight) hours as needed for severe pain.  Dispense:  90 tablet    Refill:  0    Do not add this medication to the electronic "Automatic Refill" notification system. Patient may have prescription filled one day early if pharmacy is closed on scheduled refill date. Do not fill until: 03/18/17 To last until: 04/17/17  . DISCONTD: HYDROcodone-acetaminophen (NORCO/VICODIN) 5-325 MG tablet    Sig: Take 1 tablet by mouth every 8 (eight) hours as needed for severe pain.    Dispense:  90 tablet    Refill:  0    Do not add this medication to the electronic "Automatic Refill" notification system. Patient may have prescription filled one day early if pharmacy is closed on scheduled refill date. Do not fill until: 04/17/17 To last until: 05/17/17  . HYDROcodone-acetaminophen (NORCO/VICODIN) 5-325 MG tablet    Sig: Take 1 tablet by mouth every 8 (eight) hours as needed for severe pain.    Dispense:  90 tablet    Refill:  0    Do not add this medication to the electronic "Automatic Refill" notification system. Patient may have prescription filled one day early if pharmacy is closed on scheduled refill date. Do not fill until: 04/17/17 To last until: 05/17/17   New Prescriptions   No medications on file   Medications administered today: Theresa [name] had no medications administered during this visit. Lab-work, procedure(s), and/or referral(s): Orders Placed This Encounter  Procedures  . LUMBAR FACET(MEDIAL BRANCH NERVE BLOCK) MBNB  . ToxASSURE Select 13 (MW), Urine   Imaging and/or referral(s): None  Interventional therapies: Planned, scheduled, and/or pending:   Diagnostic bilateral lumbar facet block #2    Considering:   Diagnostic bilateral lumbar facet block  Possible bilateral lumbar facet RFA    Palliative PRN treatment(s):   Palliative left sided  lumbar facet block    Provider-requested follow-up: Return in about 3 months (around 05/19/2017) for Med-Mgmt, by NP, in addition, procedure (w/ sedation):, (ASAP), by MD.  Future Appointments Date Time Provider [place]  02/17/2017 9:45 AM [facility] [place] None  02/22/2017 8:45 AM Milinda Pointer, MD [facility] None  02/28/2017 10:45 AM Harlin Heys, MD [facility] None  03/02/2017 9:00 AM Margo Common, PA [facility] None  03/07/2017 10:00 AM [name], FNP [facility] None  05/11/2017 11:00 AM [place], NP [facility] None   Primary Care Physician: Margo Common, PA Location: [facility] Note by: Kathlen Brunswick. Dossie Arbour, M.D, DABA, DABAPM, DABPM, DABIPP, FIPP Date: 02/16/2017; Time: 12:37 PM  Patient instructions provided during this appointment: Patient Instructions   Pain Score  Introduction: The pain score used by this practice is the Verbal Numerical Rating Scale (VNRS-11). This is an 11-point scale. It is for adults and children 10 years or older. There are significant differences in how the pain score is reported, used, and applied. Forget everything you learned in the past and learn this scoring system.  General Information: The scale should reflect your current level of pain. Unless you are specifically asked for the level of your worst pain, or your average pain. If you are asked for one of these two, then it should be understood that it is over the past 24 hours.  Basic Activities of Daily Living (ADL): Personal hygiene, dressing, eating, transferring, and using restroom.  Instructions: Most patients tend to report their level of pain as a combination of two factors, their physical pain and their psychosocial pain. This last one is also known as "suffering" and it is reflection  of how physical pain affects you socially and psychologically. From now on, report them separately. From this point on, when asked to report your pain level,  report only your physical pain. Use the following table for reference.  Pain Clinic Pain Levels (0-5/10)  Pain Level Score Description  No Pain 0   Mild pain 1 Nagging, annoying, but does not interfere with basic activities of daily living (ADL). Patients are able to eat, bathe, get dressed, toileting (being able to get on and off the toilet and perform personal hygiene functions), transfer (move in and out of bed or a chair without assistance), and maintain continence (able to control bladder and bowel functions). Blood pressure and heart rate are unaffected. A normal heart rate for a healthy adult ranges from 60 to 100 bpm (beats per minute).   Mild to moderate pain 2 Noticeable and distracting. Impossible to hide from other people. More frequent flare-ups. Still possible to adapt and function close to normal. It can be very annoying and may have occasional stronger flare-ups. With discipline, patients may get used to it and adapt.   Moderate pain 3 Interferes significantly with activities of daily living (ADL). It becomes difficult to feed, bathe, get dressed, get on and off the toilet or to perform personal hygiene functions. Difficult to get in and out of bed or a chair without assistance. Very distracting. With effort, it can be ignored when deeply involved in activities.   Moderately severe pain 4 Impossible to ignore for more than a few minutes. With effort, patients may still be able to manage work or participate in some social activities. Very difficult to concentrate. Signs of autonomic nervous system discharge are evident: dilated pupils (mydriasis); mild sweating (diaphoresis); sleep interference. Heart rate becomes elevated (>115 bpm). Diastolic blood pressure (lower number) rises above 100 mmHg. Patients find relief in laying down and not moving.   Severe pain 5 Intense and extremely unpleasant. Associated with frowning face and frequent crying. Pain overwhelms the senses.  Ability to do  any activity or maintain social relationships becomes significantly limited. Conversation becomes difficult. Pacing back and forth is common, as getting into a comfortable position is nearly impossible. Pain wakes you up from deep sleep. Physical signs will be obvious: pupillary dilation; increased sweating; goosebumps; brisk reflexes; cold, clammy hands and feet; nausea, vomiting or dry heaves; loss of appetite; significant sleep disturbance with inability to fall asleep or to remain asleep. When persistent, significant weight loss is observed due to the complete loss of appetite and sleep deprivation.  Blood pressure and heart rate becomes significantly elevated. Caution: If elevated blood pressure triggers a pounding headache associated with blurred vision, then the patient should immediately seek attention at an urgent or emergency care unit, as these may be signs of an impending stroke.    Emergency Department Pain Levels (6-10/10)  Emergency Room Pain 6 Severely limiting. Requires emergency care and should not be seen or managed at an outpatient pain management facility. Communication becomes difficult and requires great effort. Assistance to reach the emergency department may be required. Facial flushing and profuse sweating along with potentially dangerous increases in heart rate and blood pressure will be evident.   Distressing pain 7 Self-care is very difficult. Assistance is required to transport, or use restroom. Assistance to reach the emergency department will be required. Tasks requiring coordination, such as bathing and getting dressed become very difficult.   Disabling pain 8 Self-care is no longer possible. At this level, pain is  disabling. The individual is unable to do even the most "basic" activities such as walking, eating, bathing, dressing, transferring to a bed, or toileting. Fine motor skills are lost. It is difficult to think clearly.   Incapacitating pain 9 Pain becomes  incapacitating. Thought processing is no longer possible. Difficult to remember your own name. Control of movement and coordination are lost.   The worst pain imaginable 10 At this level, most patients pass out from pain. When this level is reached, collapse of the autonomic nervous system occurs, leading to a sudden drop in blood pressure and heart rate. This in turn results in a temporary and dramatic drop in blood flow to the brain, leading to a loss of consciousness. Fainting is one of the body's self defense mechanisms. Passing out puts the brain in a calmed state and causes it to shut down for a while, in order to begin the healing process.    Summary: 1. Refer to this scale when providing [place] with your pain level. 2. Be accurate and careful when reporting your pain level. This will help with your care. 3. Over-reporting your pain level will lead to loss of credibility. 4. Even a level of 1/10 means that there is pain and will be treated at our facility. 5. High, inaccurate reporting will be documented as "Symptom Exaggeration", leading to loss of credibility and suspicions of possible secondary gains such as obtaining more narcotics, or wanting to appear disabled, for fraudulent reasons. 6. Only pain levels of 5 or below will be seen at our facility. 7. Pain levels of 6 and above will be sent to the Emergency Department and the appointment cancelled. _____________________________________________________________________________________________

## 2017-02-16 NOTE — Discharge Summary (Signed)
[place] [facility] [place] NAME: Theresa [name]    MR#:  [id number]  DATE OF BIRTH:  07-Jul-1956  DATE OF ADMISSION:  02/13/2017 ADMITTING PHYSICIAN: Demetrios Loll, MD  DATE OF DISCHARGE: 02/16/2017  PRIMARY CARE PHYSICIAN: [name], PA    ADMISSION DIAGNOSIS:  SOB (shortness of breath) [R06.02]  DISCHARGE DIAGNOSIS:  Principal Problem:   Active Problems:   Acute respiratory failure with hypoxia (HCC) AE COPD  SECONDARY DIAGNOSIS:   Past Medical History:  Diagnosis Date  . Anxiety   . Carpal tunnel syndrome   . CHF (congestive heart failure) ([place])   . CHF (congestive heart failure) ([place])    1/18  . Chronic generalized abdominal pain   . Chronic rhinitis   . COPD (chronic obstructive pulmonary disease) ([place])   . DDD (degenerative disc disease), cervical   . Depression   . Edema   . Flu    1/18  . Gastritis   . Hematuria   . Hypertension   . Kidney stones   . Low back pain   . Lumbar radiculitis   . Malodorous urine   . Muscle weakness   . Obesity   . Renal cyst   . Sensory urge incontinence   . Tobacco abuse     HOSPITAL COURSE:   61 -year-old female with chronic respiratory failure on 2 L of oxygen who presents with acute on chronic hypoxic respiratory failure in the setting of acute exacerbation of COPD.  1. Acute on chronic hypoxic respiratory failure in the setting of acute COPD exacerbation Patient Has been weaned off of BiPAP and on baseline of 2 L oxygen.  2. Acute exacerbation of COPD: Patient symptoms have improved. Chest x-ray shows atelectasis versus possible early infiltrate. Patient will be discharged with 5 days of prednisone and Levaquin. Patient will have follow-up on Monday with her pulmonologist.   3.Tobacco dependence: Patient is encouraged to quit smoking. She was discharged with nicotine chewing gum as per her request.   4. Depression with recent loss of daughter at [facility] stage Psychiatry  consultation appreciated. She will continue Zoloft and Trazodone  5. Chronic diastolic heart failure: Continue Lasix  No signs of exacerbation at this time.  DISCHARGE CONDITIONS AND DIET:   Patient stable for discharge on heart healthy diet  CONSULTS OBTAINED:  Treatment Team:  Gonzella Lex, MD  DRUG ALLERGIES:   Allergies  Allergen Reactions  . Codeine Nausea And Vomiting  . Meloxicam Other (See Comments)    Stomach pain    DISCHARGE MEDICATIONS:   Current Discharge Medication List    START taking these medications   Details  dextromethorphan-guaiFENesin (TUSSIN DM) 10-100 MG/5ML liquid Take 5 mLs by mouth every 4 (four) hours as needed for cough. Qty: 118 mL, Refills: 0    levofloxacin (LEVAQUIN) 750 MG tablet Take 1 tablet (750 mg total) by mouth daily. Qty: 5 tablet, Refills: 0    nicotine polacrilex ([employer]S NICOTINE POLACRILEX) 2 MG gum Take 1 each (2 mg total) by mouth as needed for smoking cessation. Qty: 100 tablet, Refills: 0    predniSONE (DELTASONE) 50 MG tablet Take 1 tablet (50 mg total) by mouth daily with breakfast. Qty: 5 tablet, Refills: 0    traZODone (DESYREL) 100 MG tablet Take 1 tablet (100 mg total) by mouth at bedtime. Qty: 30 tablet, Refills: 0      CONTINUE these medications which have NOT CHANGED   Details  !! ALPRAZolam (XANAX) 0.5  MG tablet Take 1 tablet (0.5 mg total) by mouth 2 (two) times daily as needed for anxiety. Qty: 60 tablet, Refills: 0    bisoprolol-hydrochlorothiazide (ZIAC) 10-6.25 MG tablet TAKE 1 TABLET BY MOUTH DAILY Qty: 90 tablet, Refills: 1    Cholecalciferol ([employer] D3) 2000 units CAPS Take 1 capsule by mouth daily.    Fluticasone-Salmeterol (ADVAIR DISKUS) 250-50 MCG/DOSE AEPB INHALE 1 PUFF INTO LUNGS TWICE A DAY    furosemide (LASIX) 40 MG tablet Take 1 tablet (40 mg total) by mouth daily. Qty: 90 tablet, Refills: 4    HYDROcodone-acetaminophen (NORCO/VICODIN) 5-325 MG tablet Take 1 tablet by mouth every 8  (eight) hours as needed for severe pain. Qty: 90 tablet, Refills: 0   Associated Diagnoses: Chronic pain syndrome    OXYGEN Inhale 2.5 L into the lungs continuous. Reported on 11/03/2015    potassium chloride SA (K-DUR,KLOR-CON) 20 MEQ tablet Take 1 tablet (20 mEq total) by mouth daily. Qty: 90 tablet, Refills: 4    ranitidine (ZANTAC) 150 MG tablet Take 150 mg by mouth 2 (two) times daily.    roflumilast (DALIRESP) 500 MCG TABS tablet Take 500 mcg by mouth daily.    sertraline (ZOLOFT) 100 MG tablet TAKE 1 TABLET (100 MG TOTAL) BY MOUTH DAILY. Qty: 90 tablet, Refills: 4    albuterol (PROVENTIL HFA;VENTOLIN HFA) 108 (90 Base) MCG/ACT inhaler Inhale 2 puffs into the lungs every 6 (six) hours as needed for wheezing. Reported on 11/03/2015 Qty: 1 Inhaler, Refills: 2    !! ALPRAZolam (XANAX) 0.5 MG tablet Take 1 tablet (0.5 mg total) by mouth at bedtime as needed for anxiety. Qty: 60 tablet, Refills: 0    Aspirin-Caffeine (BC FAST PAIN RELIEF ARTHRITIS) 1000-65 MG PACK Take 1 packet by mouth every 6 (six) hours as needed (Pain).    ipratropium-albuterol (DUONEB) 0.5-2.5 (3) MG/3ML SOLN Take 3 mLs by nebulization every 6 (six) hours as needed (shortness of breath). Qty: 360 mL, Refills: 3   Associated Diagnoses: COPD with acute exacerbation (HCC)    Menthol, Topical Analgesic, (BENGAY COLD THERAPY) 5 % GEL Apply 1 application topically daily.    tamsulosin (FLOMAX) 0.4 MG CAPS capsule Take 1 capsule (0.4 mg total) by mouth daily. Qty: 30 capsule, Refills: 3     !! - Potential duplicate medications found. Please discuss with provider.    STOP taking these medications     erythromycin ophthalmic ointment      HYDROcodone-acetaminophen (NORCO/VICODIN) 5-325 MG tablet      HYDROcodone-acetaminophen (NORCO/VICODIN) 5-325 MG tablet           Today   CHIEF COMPLAINT:   Patient is doing well this morning. Continues to have a cough no fevers or chills shortness of breath is  improved and wheezing has improved   VITAL SIGNS:  Blood pressure 120/60, pulse 75, temperature 97.5 F (36.4 C), temperature source Oral, resp. rate 20, height _0  (1.651 m), weight 113.4 kg (250 lb), SpO2 96 %.   REVIEW OF SYSTEMS:  Review of Systems  Constitutional: Negative.  Negative for chills, fever and malaise/fatigue.  HENT: Negative.  Negative for ear discharge, ear pain, hearing loss, nosebleeds and sore throat.   Eyes: Negative.  Negative for blurred vision and pain.  Respiratory: Positive for cough. Negative for hemoptysis, shortness of breath and wheezing.   Cardiovascular: Negative.  Negative for chest pain, palpitations and leg swelling.  Gastrointestinal: Negative.  Negative for abdominal pain, blood in stool, diarrhea, nausea and vomiting.  Genitourinary: Negative.  Negative for dysuria.  Musculoskeletal: Negative.  Negative for back pain.  Skin: Negative.   Neurological: Negative for dizziness, tremors, speech change, focal weakness, seizures and headaches.  Endo/Heme/Allergies: Negative.  Does not bruise/bleed easily.  Psychiatric/Behavioral: Positive for depression. Negative for hallucinations and suicidal ideas.     PHYSICAL EXAMINATION:  GENERAL:  61 y.o.-year-old patient lying in the bed with no acute distress.  NECK:  Supple, no jugular venous distention. No thyroid enlargement, no tenderness.  LUNGS: Normal breath sounds bilaterally, no wheezing, rales,rhonchi  No use of accessory muscles of respiration.  CARDIOVASCULAR: S1, S2 normal. No murmurs, rubs, or gallops.  ABDOMEN: Soft, non-tender, non-distended. Bowel sounds present. No organomegaly or mass.  EXTREMITIES: No pedal edema, cyanosis, or clubbing.  PSYCHIATRIC: The patient is alert and oriented x 3.  SKIN: No obvious rash, lesion, or ulcer.   DATA REVIEW:   CBC  Recent Labs Lab 02/14/17 0527  WBC 11.5*  HGB 14.0  HCT 43.6  PLT 134*    Chemistries   Recent Labs Lab 02/13/17 1639  02/14/17 0527  NA 144 145  K 3.7 3.8  CL 107 104  CO2 29 32  GLUCOSE 118* 153*  BUN 14 19  CREATININE 0.81 0.73  CALCIUM 8.9 9.1  MG 2.1  --     Cardiac Enzymes  Recent Labs Lab 02/13/17 1639  [place] <0.03    Microbiology Results  _0 @  RADIOLOGY:  Dg Chest 1 View  Result Date: 02/15/2017 CLINICAL DATA:  Two days of nonproductive cough with onset of shortness of breath last night. History of CHF, COPD, current smoker. EXAM: CHEST 1 VIEW COMPARISON:  Portable chest x-ray of Feb 14, 2017 FINDINGS: The lungs are mildly hyperinflated. The lung markings are coarse in the left lower lung. The cardiac silhouette is enlarged. The pulmonary vascularity is engorged. The mediastinum is normal in width. There is no pleural effusion. IMPRESSION: COPD. Superimposed mild CHF. There may be subsegmental atelectasis or early infiltrate in the left lower lobe though I favor the former. When the patient can tolerate the procedure, a PA and lateral chest x-ray would be useful. Electronically Signed   By: David  Martinique M.D.   On: 02/15/2017 09:53      Current Discharge Medication List    START taking these medications   Details  dextromethorphan-guaiFENesin (TUSSIN DM) 10-100 MG/5ML liquid Take 5 mLs by mouth every 4 (four) hours as needed for cough. Qty: 118 mL, Refills: 0    levofloxacin (LEVAQUIN) 750 MG tablet Take 1 tablet (750 mg total) by mouth daily. Qty: 5 tablet, Refills: 0    nicotine polacrilex (CVS NICOTINE POLACRILEX) 2 MG gum Take 1 each (2 mg total) by mouth as needed for smoking cessation. Qty: 100 tablet, Refills: 0    predniSONE (DELTASONE) 50 MG tablet Take 1 tablet (50 mg total) by mouth daily with breakfast. Qty: 5 tablet, Refills: 0    traZODone (DESYREL) 100 MG tablet Take 1 tablet (100 mg total) by mouth at bedtime. Qty: 30 tablet, Refills: 0      CONTINUE these medications which have NOT CHANGED   Details  !! ALPRAZolam (XANAX) 0.5 MG tablet Take 1  tablet (0.5 mg total) by mouth 2 (two) times daily as needed for anxiety. Qty: 60 tablet, Refills: 0    bisoprolol-hydrochlorothiazide (ZIAC) 10-6.25 MG tablet TAKE 1 TABLET BY MOUTH DAILY Qty: 90 tablet, Refills: 1    Cholecalciferol (CVS D3) 2000 units CAPS Take 1 capsule by mouth daily.  Fluticasone-Salmeterol (ADVAIR DISKUS) 250-50 MCG/DOSE AEPB INHALE 1 PUFF INTO LUNGS TWICE A DAY    furosemide (LASIX) 40 MG tablet Take 1 tablet (40 mg total) by mouth daily. Qty: 90 tablet, Refills: 4    HYDROcodone-acetaminophen (NORCO/VICODIN) 5-325 MG tablet Take 1 tablet by mouth every 8 (eight) hours as needed for severe pain. Qty: 90 tablet, Refills: 0   Associated Diagnoses: Chronic pain syndrome    OXYGEN Inhale 2.5 L into the lungs continuous. Reported on 11/03/2015    potassium chloride SA (K-DUR,KLOR-CON) 20 MEQ tablet Take 1 tablet (20 mEq total) by mouth daily. Qty: 90 tablet, Refills: 4    ranitidine (ZANTAC) 150 MG tablet Take 150 mg by mouth 2 (two) times daily.    roflumilast (DALIRESP) 500 MCG TABS tablet Take 500 mcg by mouth daily.    sertraline (ZOLOFT) 100 MG tablet TAKE 1 TABLET (100 MG TOTAL) BY MOUTH DAILY. Qty: 90 tablet, Refills: 4    albuterol (PROVENTIL HFA;VENTOLIN HFA) 108 (90 Base) MCG/ACT inhaler Inhale 2 puffs into the lungs every 6 (six) hours as needed for wheezing. Reported on 11/03/2015 Qty: 1 Inhaler, Refills: 2    !! ALPRAZolam (XANAX) 0.5 MG tablet Take 1 tablet (0.5 mg total) by mouth at bedtime as needed for anxiety. Qty: 60 tablet, Refills: 0    Aspirin-Caffeine (BC FAST PAIN RELIEF ARTHRITIS) 1000-65 MG PACK Take 1 packet by mouth every 6 (six) hours as needed (Pain).    ipratropium-albuterol (DUONEB) 0.5-2.5 (3) MG/3ML SOLN Take 3 mLs by nebulization every 6 (six) hours as needed (shortness of breath). Qty: 360 mL, Refills: 3   Associated Diagnoses: COPD with acute exacerbation (HCC)    Menthol, Topical Analgesic, (BENGAY COLD THERAPY) 5 %  GEL Apply 1 application topically daily.    tamsulosin (FLOMAX) 0.4 MG CAPS capsule Take 1 capsule (0.4 mg total) by mouth daily. Qty: 30 capsule, Refills: 3     !! - Potential duplicate medications found. Please discuss with provider.    STOP taking these medications     erythromycin ophthalmic ointment      HYDROcodone-acetaminophen (NORCO/VICODIN) 5-325 MG tablet      HYDROcodone-acetaminophen (NORCO/VICODIN) 5-325 MG tablet             Management plans discussed with the patient and she is in agreement. Stable for discharge home  Patient should follow up with pcp  CODE STATUS:     Code Status Orders        Start     Ordered   02/13/17 2040  Full code  Continuous     02/13/17 2039    Code Status History    Date Active Date Inactive Code Status Order ID Comments User Context   10/25/2016  3:46 AM 11/03/2016  4:36 PM Full Code [id number]  Awilda Bill, NP ED   11/03/2015  2:45 AM 11/04/2015  2:28 PM Full Code [id number]  Harrie Foreman, MD Inpatient   06/29/2015  3:12 AM 07/04/2015  3:55 PM Full Code [id number]  Lytle Butte, MD ED   04/18/2015  5:59 AM 04/21/2015  9:46 PM Full Code [id number]  Harrie Foreman, MD Inpatient      TOTAL TIME TAKING CARE OF THIS PATIENT: 37 minutes.    Note: This dictation was prepared with Dragon dictation along with smaller phrase technology. Any transcriptional errors that result from this process are unintentional.  Lua Feng M.D on 02/16/2017 at 6:45 AM  Between 7am to 6pm - Pager - [phone]  After 6pm go to [email or web address] - Proofreader  [facility]  Office  [phone]  CC: Primary care physician; [name], PA

## 2017-02-17 ENCOUNTER — Telehealth: Payer: Self-pay

## 2017-02-17 ENCOUNTER — Ambulatory Visit (INDEPENDENT_AMBULATORY_CARE_PROVIDER_SITE_OTHER): Payer: Medicare PPO

## 2017-02-17 DIAGNOSIS — R9389 Abnormal findings on diagnostic imaging of other specified body structures: Secondary | ICD-10-CM

## 2017-02-17 DIAGNOSIS — R938 Abnormal findings on diagnostic imaging of other specified body structures: Secondary | ICD-10-CM | POA: Diagnosis not present

## 2017-02-17 DIAGNOSIS — N83209 Unspecified ovarian cyst, unspecified side: Secondary | ICD-10-CM | POA: Diagnosis not present

## 2017-02-18 LAB — CULTURE, BLOOD (ROUTINE X 2)
Culture: NO GROWTH
Culture: NO GROWTH
Special Requests: ADEQUATE

## 2017-02-20 ENCOUNTER — Ambulatory Visit: Payer: Medicare PPO | Admitting: Psychiatry

## 2017-02-20 ENCOUNTER — Telehealth: Payer: Self-pay | Admitting: Family Medicine

## 2017-02-20 ENCOUNTER — Telehealth: Payer: Self-pay | Admitting: Pain Medicine

## 2017-02-20 NOTE — Telephone Encounter (Signed)
Denyse Amass with [facility] states they started home health visits with pt today.  Plan of care is see pt 2 times a week for 1 week and 1 time a week for 3 weeks.  A medical social working will be reviewing pt housing.  Denyse Amass will fax order.  CB#[phone]/MW

## 2017-02-20 NOTE — Telephone Encounter (Signed)
Message placed under medications that specimen needs to recollected d/t improper labeling.

## 2017-02-20 NOTE — Telephone Encounter (Signed)
[employer] left message stating the specimen for Theresa [name] will need to be recollected because it did not have a labe or dob or control # on it.

## 2017-02-21 NOTE — Telephone Encounter (Signed)
LMTCB

## 2017-02-21 NOTE — Telephone Encounter (Signed)
Transition Care Management Follow-up Telephone Call    Date discharged?   How have you been since you were released from the hospital? Recovering, some SOB. Declines dizziness and weakness.   Any patient concerns? None   Items Reviewed:  Medications reviewed: Yes  Allergies reviewed: Yes  Dietary changes reviewed: Yes  Referrals reviewed: Yes, pt to schedule with Dr. Raul Del.   Functional Questionnaire:  Independent - I Dependent - D    Activities of Daily Living (ADLs):    Personal hygiene - I Dressing - I Eating - I Maintaining continence - I Transferring - I   Independent Activities of Daily Living (iADLs): Basic communication skills - I Transportation - I Meal preparation - I Shopping - I Housework - I Managing medications - I  Managing personal finances - I   Confirmed importance and date/time of follow-up visits scheduled YES  Provider Appointment booked with PCP 03/02/17 @ 9 AM.  Confirmed with patient if condition begins to worsen call PCP or go to the ER.  Patient was given the office number and encouraged to call back with question or concerns: YES

## 2017-02-21 NOTE — Telephone Encounter (Signed)
If she is continuing to use the Trazodone 100 mg HS (prescribed at discharge from recent hospitalization), should only use the 100 mg Zoloft one tablet daily until follow up appointment. It will take a little time to build up to a blood level that is therapeutic. If not using the Trazodone, can increase Zoloft to 150 mg qd. Let us know what dosage of Zoloft to refill.

## 2017-02-22 ENCOUNTER — Encounter: Payer: Self-pay | Admitting: Pain Medicine

## 2017-02-22 ENCOUNTER — Ambulatory Visit
Admission: RE | Admit: 2017-02-22 | Discharge: 2017-02-22 | Disposition: A | Discharge status: Home / Self Care | Payer: Medicare PPO | Source: Ambulatory Visit | Attending: Pain Medicine | Admitting: Pain Medicine

## 2017-02-22 ENCOUNTER — Ambulatory Visit (HOSPITAL_BASED_OUTPATIENT_CLINIC_OR_DEPARTMENT_OTHER): Payer: Medicare PPO | Admitting: Pain Medicine

## 2017-02-22 VITALS — BP 95/44 | HR 71 | Temp 98.7°F | Resp 21 | Ht 63.0 in | Wt 257.0 lb

## 2017-02-22 DIAGNOSIS — J42 Unspecified chronic bronchitis: Secondary | ICD-10-CM | POA: Insufficient documentation

## 2017-02-22 DIAGNOSIS — M545 Low back pain: Secondary | ICD-10-CM

## 2017-02-22 DIAGNOSIS — M47816 Spondylosis without myelopathy or radiculopathy, lumbar region: Secondary | ICD-10-CM | POA: Diagnosis not present

## 2017-02-22 DIAGNOSIS — G8929 Other chronic pain: Secondary | ICD-10-CM | POA: Diagnosis not present

## 2017-02-22 DIAGNOSIS — M4696 Unspecified inflammatory spondylopathy, lumbar region: Secondary | ICD-10-CM | POA: Diagnosis not present

## 2017-02-22 DIAGNOSIS — Z885 Allergy status to narcotic agent status: Secondary | ICD-10-CM | POA: Insufficient documentation

## 2017-02-22 DIAGNOSIS — Z888 Allergy status to other drugs, medicaments and biological substances status: Secondary | ICD-10-CM | POA: Diagnosis not present

## 2017-02-22 DIAGNOSIS — M488X6 Other specified spondylopathies, lumbar region: Secondary | ICD-10-CM | POA: Insufficient documentation

## 2017-02-22 DIAGNOSIS — M47896 Other spondylosis, lumbar region: Secondary | ICD-10-CM

## 2017-02-22 IMAGING — DX DG CHEST 1V PORT
1 series · 1 of 1 positions shown · non-contrast
Comparison: Chest x-ray and chest CT scan October 25, 2015

CLINICAL DATA: Respiratory failure, CHF, pulmonary hypertension,
current smoker.

EXAM:
PORTABLE CHEST 1 VIEW

[chest ap]
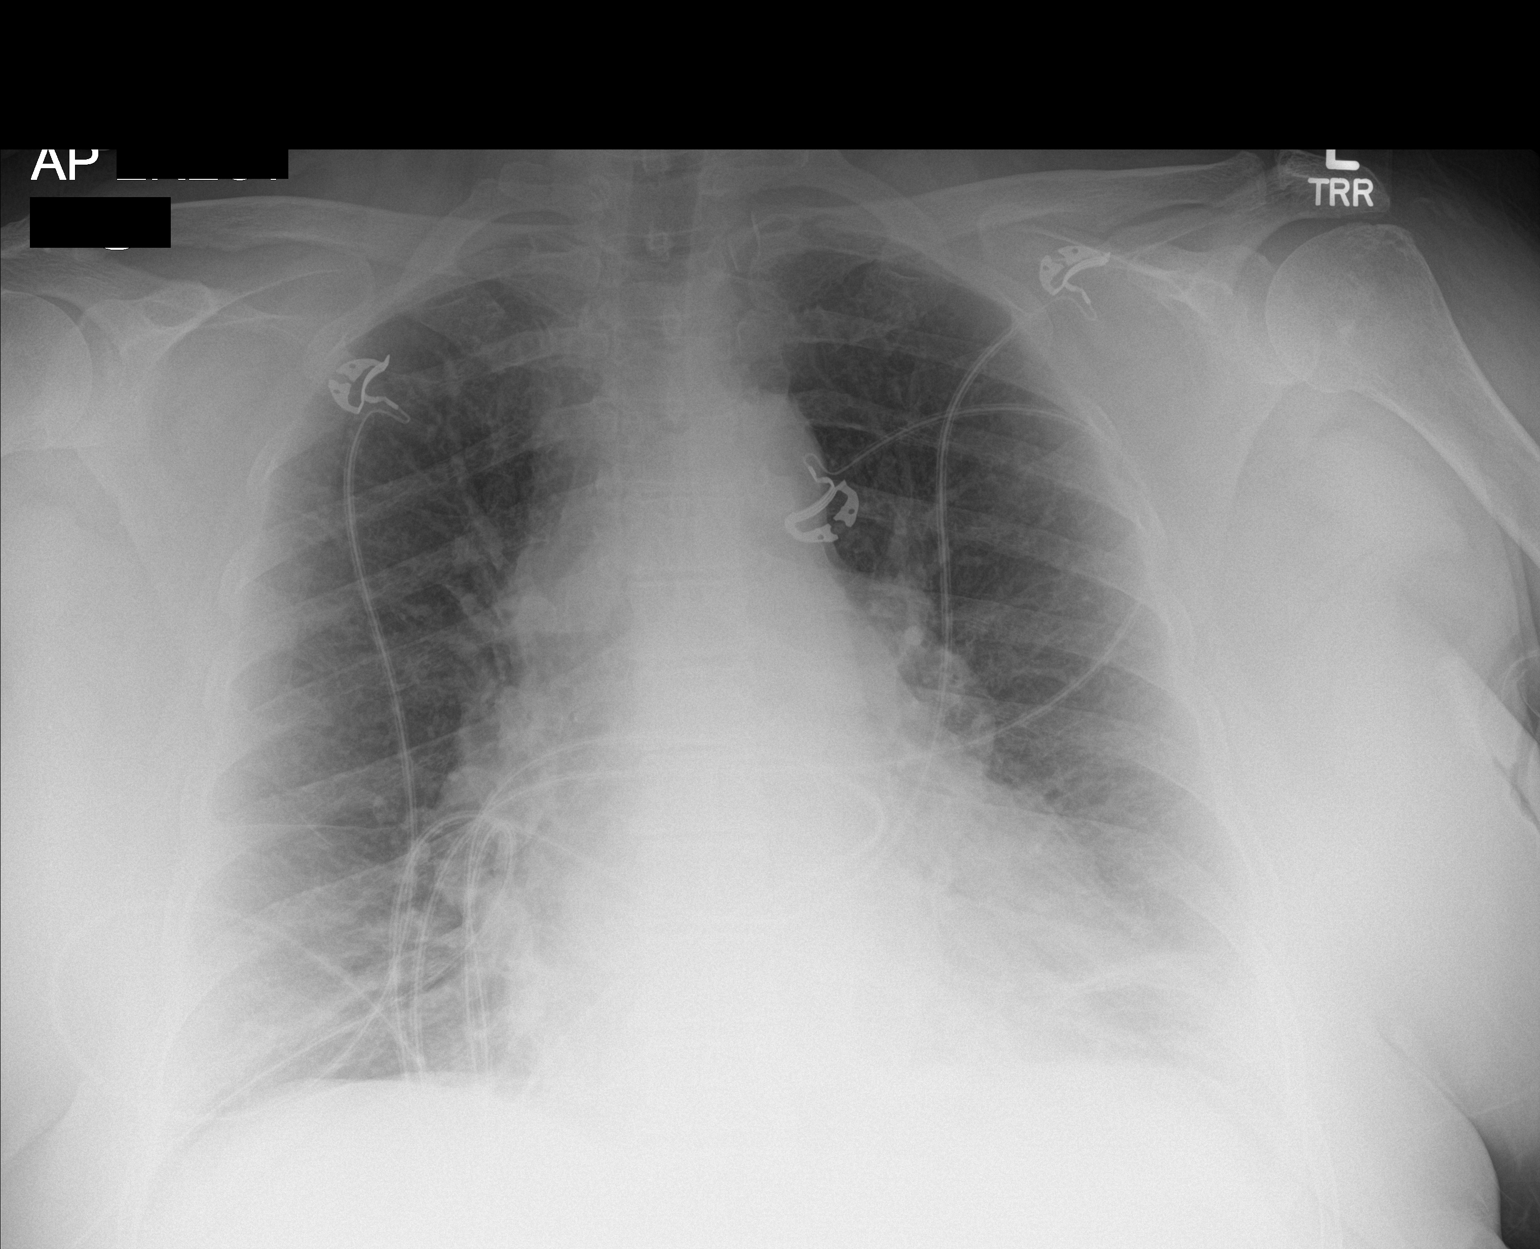

[1 of 1 positions shown; findings below may reference images not displayed]

FINDINGS: The lungs are well-expanded. There is no focal infiltrate. The lung
markings remain increased at both lung bases. There is no
significant pleural effusion. The cardiac silhouette is enlarged.
The central pulmonary vascularity is prominent but more distinct
today. There is calcification in the wall of the aortic arch. The
trachea is midline. The bony thorax exhibits no acute abnormality.
IMPRESSION: Mild interval improvement in the appearance of the chest with
decreased interstitial edema and pulmonary vascular congestion.
Persistent subsegmental atelectasis at the lung bases.

Thoracic aortic atherosclerosis.

## 2017-02-22 MED ORDER — TRIAMCINOLONE ACETONIDE 40 MG/ML IJ SUSP
40.0000 mg | Freq: Once | INTRAMUSCULAR | Status: AC
  Administered 2017-02-22: 40 mg
  Filled 2017-02-22: qty 1

## 2017-02-22 MED ORDER — LIDOCAINE HCL (PF) 1 % IJ SOLN
10.0000 mL | Freq: Once | INTRAMUSCULAR | Status: AC
  Administered 2017-02-22: 5 mL
  Filled 2017-02-22: qty 10

## 2017-02-22 MED ORDER — ROPIVACAINE HCL 2 MG/ML IJ SOLN
9.0000 mL | Freq: Once | INTRAMUSCULAR | Status: AC
  Administered 2017-02-22: 9 mL via PERINEURAL
  Filled 2017-02-22: qty 10

## 2017-02-22 MED ORDER — MIDAZOLAM HCL 5 MG/5ML IJ SOLN
1.0000 mg | INTRAMUSCULAR | Status: DC | PRN
  Administered 2017-02-22: 5 mg via INTRAVENOUS
  Filled 2017-02-22: qty 5

## 2017-02-22 MED ORDER — FENTANYL CITRATE (PF) 100 MCG/2ML IJ SOLN
25.0000 ug | INTRAMUSCULAR | Status: DC | PRN
  Administered 2017-02-22: 100 ug via INTRAVENOUS
  Filled 2017-02-22: qty 2

## 2017-02-22 MED ORDER — LACTATED RINGERS IV SOLN: 1000.0000 mL | Freq: Once | INTRAVENOUS | Status: DC

## 2017-02-22 MED ORDER — ROPIVACAINE HCL 2 MG/ML IJ SOLN
9.0000 mL | Freq: Once | INTRAMUSCULAR | Status: AC
  Administered 2017-02-22: 10 mL via PERINEURAL
  Filled 2017-02-22: qty 10

## 2017-02-22 NOTE — Patient Instructions (Addendum)
Post-Procedure instructions Instructions:  Apply ice: Fill a plastic sandwich bag with crushed ice. Cover it with a small towel and apply to injection site. Apply for 15 minutes then remove x 15 minutes. Repeat sequence on day of procedure, until you go to bed. The purpose is to minimize swelling and discomfort after procedure.  Apply heat: Apply heat to procedure site starting the day following the procedure. The purpose is to treat any soreness and discomfort from the procedure.  Food intake: Start with clear liquids (like water) and advance to regular food, as tolerated.   Physical activities: Keep activities to a minimum for the first 8 hours after the procedure.   Driving: If you have received any sedation, you are not allowed to drive for 24 hours after your procedure.  Blood thinner: Restart your blood thinner 6 hours after your procedure. (Only for those taking blood thinners)  Insulin: As soon as you can eat, you may resume your normal dosing schedule. (Only for those taking insulin)  Infection prevention: Keep procedure site clean and dry.  Post-procedure Pain Diary: Extremely important that this be done correctly and accurately. Recorded information will be used to determine the next step in treatment.  Pain evaluated is that of treated area only. Do not include pain from an untreated area.  Complete every hour, on the hour, for the initial 8 hours. Set an alarm to help you do this part accurately.  Do not go to sleep and have it completed later. It will not be accurate.  Follow-up appointment: Keep your follow-up appointment after the procedure. Usually 2 weeks for most procedures. (6 weeks in the case of radiofrequency.) Bring you pain diary.  Expect:  From numbing medicine (AKA: Local Anesthetics): Numbness or decrease in pain.  Onset: Full effect within 15 minutes of injected.  Duration: It will depend on the type of local anesthetic used. On the average, 1 to 8  hours.   From steroids: Decrease in swelling or inflammation. Once inflammation is improved, relief of the pain will follow.  Onset of benefits: Depends on the amount of swelling present. The more swelling, the longer it will take for the benefits to be seen.   Duration: Steroids will stay in the system x 2 weeks. Duration of benefits will depend on multiple posibilities including persistent irritating factors.  From procedure: Some discomfort is to be expected once the numbing medicine wears off. This should be minimal if ice and heat are applied as instructed. Call if:  You experience numbness and weakness that gets worse with time, as opposed to wearing off.  New onset bowel or bladder incontinence. (Spinal procedures only)  Emergency Numbers:  [facility] business hours (Monday - Thursday, 8:00 AM - 4:00 PM) (Friday, 9:00 AM - 12:00 Noon): [phone]  After hours: [phone] _____________________________________________________________________________________________  ____________________________________________________________________________________________  Medication Rules  Applies to: All patients receiving prescriptions (written or electronic).  Pharmacy of record: Pharmacy where electronic prescriptions will be sent. If written prescriptions are taken to a different pharmacy, please inform the nursing staff. The pharmacy listed in the electronic medical record should be the one where you would like electronic prescriptions to be sent.  Prescription refills: Only during scheduled appointments. Applies to both, written and electronic prescriptions.  NOTE: The following applies primarily to controlled substances (Opioid Pain Medications)  Patient's responsibilities: 1. Pain Pills: Bring all pain pills to every appointment (except for procedure appointments). 2. Pill Bottles: Bring pills in original pharmacy bottle. Always bring newest  bottle. Bring bottle, even if  empty. 3. Medication refills: You are responsible for knowing and keeping track of what medications you need refilled. The day before your appointment, write a list of all prescriptions that need to be refilled. Bring that list to your appointment and give it to the admitting nurse. Prescriptions will be written only during appointments. If you forget a medication, it will not be "Called in", "Faxed", or "electronically sent". You will need to get another appointment to get these prescribed. 4. Prescription Accuracy: You are responsible for carefully inspecting your prescriptions before leaving our office. Have the discharge nurse carefully go over each prescription with you, before taking them home. Make sure that your name is accurately spelled, that your address is correct. Check the name and dose of your medication to make sure it is accurate. Check the number of pills, and the written instructions to make sure they are clear and accurate. Make sure that you are given enough medication to last until your next medication refill appointment. 5. Taking Medication: Take medication as prescribed. Never take more pills than instructed. Never take medication more frequently than prescribed. Taking less pills or less frequently is permitted and encouraged, when it comes to controlled substances (written prescriptions).  6. Inform other Doctors: Always inform, all of your healthcare providers, of all the medications you take. 7. Pain Medication from other Providers: You are not allowed to accept any additional pain medication from any other Doctor or Healthcare provider. There are two exceptions to this rule. (see below) In the event that you require additional pain medication, you are responsible for notifying us, as stated below. 8. Medication Agreement: You are responsible for carefully reading and following our Medication Agreement. This must be signed before receiving any prescriptions from our practice.  Safely store a copy of your signed Agreement. Violations to the Agreement will result in no further prescriptions. (Additional copies of our Medication Agreement are available upon request.) 9. Laws, Rules, & Regulations: All patients are expected to follow all Federal and [employer], Rules, [employer]. Ignorance of the Laws does not constitute a valid excuse.  Exceptions: There are only two exceptions to the rule of not receiving pain medications from other Healthcare Providers. 1. Exception #1 (Emergencies): In the event of an emergency (i.e.: accident requiring emergency care), you are allowed to receive additional pain medication. However, you are responsible for: As soon as you are able, call our office [phone], at any time of the day or night, and leave a message stating your name, the date and nature of the emergency, and the name and dose of the medication prescribed. In the event that your call is answered by a member of our staff, make sure to document and save the date, time, and the name of the person that took your information.  2. Exception #2 (Planned Surgery): In the event that you are scheduled by another doctor or dentist to have any type of surgery or procedure, you are allowed (for a period no longer than 30 days), to receive additional pain medication, for the acute post-op pain. However, in this case, you are responsible for picking up a copy of our "Post-op Pain Management for Surgeons" handout, and giving it to your surgeon or dentist. This document is available at our office, and does not require an appointment to obtain it. Simply go to our office during business hours (Monday-Thursday from 8:00 AM to 4:00 PM) (Friday 8:00 AM to 12:00 Noon)  or if you have a scheduled appointment with [place], prior to your surgery, and ask for it by name. In addition, you will need to provide [place] with your name, name of your surgeon, type of surgery, and date of procedure or  surgery.  _____________________________________________________________________________________________Pain Score  Introduction: The pain score used by this practice is the Verbal Numerical Rating Scale (VNRS-11). This is an 11-point scale. It is for adults and children 10 years or older. There are significant differences in how the pain score is reported, used, and applied. Forget everything you learned in the past and learn this scoring system.  General Information: The scale should reflect your current level of pain. Unless you are specifically asked for the level of your worst pain, or your average pain. If you are asked for one of these two, then it should be understood that it is over the past 24 hours.  Basic Activities of Daily Living (ADL): Personal hygiene, dressing, eating, transferring, and using restroom.  Instructions: Most patients tend to report their level of pain as a combination of two factors, their physical pain and their psychosocial pain. This last one is also known as "suffering" and it is reflection of how physical pain affects you socially and psychologically. From now on, report them separately. From this point on, when asked to report your pain level, report only your physical pain. Use the following table for reference.  Pain Clinic Pain Levels (0-5/10)  Pain Level Score Description  No Pain 0   Mild pain 1 Nagging, annoying, but does not interfere with basic activities of daily living (ADL). Patients are able to eat, bathe, get dressed, toileting (being able to get on and off the toilet and perform personal hygiene functions), transfer (move in and out of bed or a chair without assistance), and maintain continence (able to control bladder and bowel functions). Blood pressure and heart rate are unaffected. A normal heart rate for a healthy adult ranges from 60 to 100 bpm (beats per minute).   Mild to moderate pain 2 Noticeable and distracting. Impossible to hide from  other people. More frequent flare-ups. Still possible to adapt and function close to normal. It can be very annoying and may have occasional stronger flare-ups. With discipline, patients may get used to it and adapt.   Moderate pain 3 Interferes significantly with activities of daily living (ADL). It becomes difficult to feed, bathe, get dressed, get on and off the toilet or to perform personal hygiene functions. Difficult to get in and out of bed or a chair without assistance. Very distracting. With effort, it can be ignored when deeply involved in activities.   Moderately severe pain 4 Impossible to ignore for more than a few minutes. With effort, patients may still be able to manage work or participate in some social activities. Very difficult to concentrate. Signs of autonomic nervous system discharge are evident: dilated pupils (mydriasis); mild sweating (diaphoresis); sleep interference. Heart rate becomes elevated (>115 bpm). Diastolic blood pressure (lower number) rises above 100 mmHg. Patients find relief in laying down and not moving.   Severe pain 5 Intense and extremely unpleasant. Associated with frowning face and frequent crying. Pain overwhelms the senses.  Ability to do any activity or maintain social relationships becomes significantly limited. Conversation becomes difficult. Pacing back and forth is common, as getting into a comfortable position is nearly impossible. Pain wakes you up from deep sleep. Physical signs will be obvious: pupillary dilation; increased sweating; goosebumps; brisk reflexes; cold, clammy  hands and feet; nausea, vomiting or dry heaves; loss of appetite; significant sleep disturbance with inability to fall asleep or to remain asleep. When persistent, significant weight loss is observed due to the complete loss of appetite and sleep deprivation.  Blood pressure and heart rate becomes significantly elevated. Caution: If elevated blood pressure triggers a pounding  headache associated with blurred vision, then the patient should immediately seek attention at an urgent or emergency care unit, as these may be signs of an impending stroke.    Emergency Department Pain Levels (6-10/10)  Emergency Room Pain 6 Severely limiting. Requires emergency care and should not be seen or managed at an outpatient pain management facility. Communication becomes difficult and requires great effort. Assistance to reach the emergency department may be required. Facial flushing and profuse sweating along with potentially dangerous increases in heart rate and blood pressure will be evident.   Distressing pain 7 Self-care is very difficult. Assistance is required to transport, or use restroom. Assistance to reach the emergency department will be required. Tasks requiring coordination, such as bathing and getting dressed become very difficult.   Disabling pain 8 Self-care is no longer possible. At this level, pain is disabling. The individual is unable to do even the most "basic" activities such as walking, eating, bathing, dressing, transferring to a bed, or toileting. Fine motor skills are lost. It is difficult to think clearly.   Incapacitating pain 9 Pain becomes incapacitating. Thought processing is no longer possible. Difficult to remember your own name. Control of movement and coordination are lost.   The worst pain imaginable 10 At this level, most patients pass out from pain. When this level is reached, collapse of the autonomic nervous system occurs, leading to a sudden drop in blood pressure and heart rate. This in turn results in a temporary and dramatic drop in blood flow to the brain, leading to a loss of consciousness. Fainting is one of the body's self defense mechanisms. Passing out puts the brain in a calmed state and causes it to shut down for a while, in order to begin the healing process.    Summary: 1. Refer to this scale when providing [place] with your pain  level. 2. Be accurate and careful when reporting your pain level. This will help with your care. 3. Over-reporting your pain level will lead to loss of credibility. 4. Even a level of 1/10 means that there is pain and will be treated at our facility. 5. High, inaccurate reporting will be documented as "Symptom Exaggeration", leading to loss of credibility and suspicions of possible secondary gains such as obtaining more narcotics, or wanting to appear disabled, for fraudulent reasons. 6. Only pain levels of 5 or below will be seen at our facility. 7. Pain levels of 6 and above will be sent to the Emergency Department and the appointment cancelled. _____________________________________________________________________________________________  Pain Management Discharge Instructions  General Discharge Instructions :  If you need to reach your doctor call: Monday-Friday 8:00 am - 4:00 pm at [phone] or toll free [phone].  After clinic hours [phone] to have operator reach doctor.  Bring all of your medication bottles to all your appointments in the pain clinic.  To cancel or reschedule your appointment with Pain Management please remember to call 24 hours in advance to avoid a fee.  Refer to the educational materials which you have been given on: General Risks, I had my Procedure. Discharge Instructions, Post Sedation.  Post Procedure Instructions:  The drugs you were  given will stay in your system until tomorrow, so for the next 24 hours you should not drive, make any legal decisions or drink any alcoholic beverages.  You may eat anything you prefer, but it is better to start with liquids then soups and crackers, and gradually work up to solid foods.  Please notify your doctor immediately if you have any unusual bleeding, trouble breathing or pain that is not related to your normal pain.  Depending on the type of procedure that was done, some parts of your body may feel week  and/or numb.  This usually clears up by tonight or the next day.  Walk with the use of an assistive device or accompanied by an adult for the 24 hours.  You may use ice on the affected area for the first 24 hours.  Put ice in a Ziploc bag and cover with a towel and place against area 15 minutes on 15 minutes off.  You may switch to heat after 24 hours.

## 2017-02-22 NOTE — Progress Notes (Signed)
Patient's Name: Theresa [name]  MRN: [id number]  Referring Provider: Margo Common, [place]  DOB: Dec 28, 1955  PCP: Margo Common, PA  DOS: 02/22/2017  Note by: Kathlen Brunswick. Dossie Arbour, MD  Service setting: Ambulatory outpatient  Location: [facility] (AMB) Pain Management Facility  Visit type: Procedure  Specialty: Interventional Pain Management  Patient type: Established   Primary Reason for Visit: Interventional Pain Management Treatment. CC: Back Pain (lower)  Procedure:  Anesthesia, Analgesia, Anxiolysis:  Type: Diagnostic Medial Branch Facet Block Region: Lumbar Level: L2, L3, L4, L5, & S1 Medial Branch Level(s) Laterality: Bilateral  Type: Local Anesthesia with Moderate (Conscious) Sedation Local Anesthetic: Lidocaine 1% Route: Intravenous (IV) IV Access: Secured Sedation: Meaningful verbal contact was maintained at all times during the procedure  Indication(s): Analgesia and Anxiety  Indications: 1. Lumbar facet syndrome (Location of Primary Source of Pain) (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)   2. Lumbar facet hypertrophy (multilevel)   3. Chronic low back pain (Location of Primary Source of Pain) (Left)   4. Lumbar spondylosis (L4-5)   5. Chronic bronchitis, unspecified chronic bronchitis type (HCC)    Pain Score: Pre-procedure: 4 /10 Post-procedure: 2 /10  Pre-op Assessment:  Previous date of service: 02/16/17 Service provided: Evaluation Theresa [name] is a 61 y.o. (year old), female patient, seen today for interventional treatment. She  has a past surgical history that includes Tubal ligation; Abdominal hysterectomy; Tonsillectomy; and Carpal tunnel release (Left). Her primarily concern today is the Back Pain (lower)  Initial Vital Signs: Blood pressure (!) 111/56, pulse 72, temperature 98.7 F (37.1 C), temperature source Oral, resp. rate 16, height _0  (1.6 m), weight 257 lb (116.6 kg), SpO2 90 %. BMI: 45.53 kg/m  Risk Assessment: Allergies: Reviewed. She is allergic to codeine and  meloxicam.  Allergy Precautions: None required Coagulopathies: Reviewed. None identified.  Blood-thinner therapy: None at this time Active Infection(s): Reviewed. None identified. Theresa [name] is afebrile  Site Confirmation: Theresa [name] was asked to confirm the procedure and laterality before marking the site Procedure checklist: Completed Consent: Before the procedure and under the influence of no sedative(s), amnesic(s), or anxiolytics, the patient was informed of the treatment options, risks and possible complications. To fulfill our ethical and legal obligations, as recommended by the American Medical Association's Code of Ethics, I have informed the patient of my clinical impression; the nature and purpose of the treatment or procedure; the risks, benefits, and possible complications of the intervention; the alternatives, including doing nothing; the risk(s) and benefit(s) of the alternative treatment(s) or procedure(s); and the risk(s) and benefit(s) of doing nothing. The patient was provided information about the general risks and possible complications associated with the procedure. These may include, but are not limited to: failure to achieve desired goals, infection, bleeding, organ or nerve damage, allergic reactions, paralysis, and death. In addition, the patient was informed of those risks and complications associated to Spine-related procedures, such as failure to decrease pain; infection (i.e.: Meningitis, epidural or intraspinal abscess); bleeding (i.e.: epidural hematoma, subarachnoid hemorrhage, or any other type of intraspinal or peri-dural bleeding); organ or nerve damage (i.e.: Any type of peripheral nerve, nerve root, or spinal cord injury) with subsequent damage to sensory, motor, and/or autonomic systems, resulting in permanent pain, numbness, and/or weakness of one or several areas of the body; allergic reactions; (i.e.: anaphylactic reaction); and/or death. Furthermore, the  patient was informed of those risks and complications associated with the medications. These include, but are not limited to: allergic reactions (i.e.: anaphylactic or anaphylactoid reaction(s)); adrenal axis  suppression; blood sugar elevation that in diabetics may result in ketoacidosis or comma; water retention that in patients with history of congestive heart failure may result in shortness of breath, pulmonary edema, and decompensation with resultant heart failure; weight gain; swelling or edema; medication-induced neural toxicity; particulate matter embolism and blood vessel occlusion with resultant organ, and/or nervous system infarction; and/or aseptic necrosis of one or more joints. Finally, the patient was informed that Medicine is not an exact science; therefore, there is also the possibility of unforeseen or unpredictable risks and/or possible complications that may result in a catastrophic outcome. The patient indicated having understood very clearly. We have given the patient no guarantees and we have made no promises. Enough time was given to the patient to ask questions, all of which were answered to the patient's satisfaction. Theresa [name] has indicated that she wanted to continue with the procedure. Attestation: I, the ordering provider, attest that I have discussed with the patient the benefits, risks, side-effects, alternatives, likelihood of achieving goals, and potential problems during recovery for the procedure that I have provided informed consent. Date: 02/22/2017; Time: 9:34 AM  Pre-Procedure Preparation:  Monitoring: As per clinic protocol. Respiration, ETCO2, SpO2, BP, heart rate and rhythm monitor placed and checked for adequate function Safety Precautions: Patient was assessed for positional comfort and pressure points before starting the procedure. Time-out: I initiated and conducted the "Time-out" before starting the procedure, as per protocol. The patient was asked to  participate by confirming the accuracy of the "Time Out" information. Verification of the correct person, site, and procedure were performed and confirmed by me, the nursing staff, and the patient. "Time-out" conducted as per Joint Commission's Universal Protocol (UP.01.01.01). "Time-out" Date & Time: 02/22/2017; 1008 hrs.  Description of Procedure Process:   Position: Prone Target Area: For Lumbar Facet blocks, the target is the groove formed by the junction of the transverse process and superior articular process. For the L5 dorsal ramus, the target is the notch between superior articular process and sacral ala. For the S1 dorsal ramus, the target is the superior and lateral edge of the posterior S1 Sacral foramen. Approach: Paramedial approach. Area Prepped: Entire Posterior Lumbosacral Region Prepping solution: ChloraPrep (2% chlorhexidine gluconate and 70% isopropyl alcohol) Safety Precautions: Aspiration looking for blood return was conducted prior to all injections. At no point did we inject any substances, as a needle was being advanced. No attempts were made at seeking any paresthesias. Safe injection practices and needle disposal techniques used. Medications properly checked for expiration dates. SDV (single dose vial) medications used. Description of the Procedure: Protocol guidelines were followed. The patient was placed in position over the fluoroscopy table. The target area was identified and the area prepped in the usual manner. Skin desensitized using vapocoolant spray. Skin & deeper tissues infiltrated with local anesthetic. Appropriate amount of time allowed to pass for local anesthetics to take effect. The procedure needle was introduced through the skin, ipsilateral to the reported pain, and advanced to the target area. Employing the "Medial Branch Technique", the needles were advanced to the angle made by the superior and medial portion of the transverse process, and the lateral and  inferior portion of the superior articulating process of the targeted vertebral bodies. This area is known as "Burton's Eye" or the "Eye of the [place] Dog". A procedure needle was introduced through the skin, and this time advanced to the angle made by the superior and medial border of the sacral ala, and the lateral border  of the S1 vertebral body. This last needle was later repositioned at the superior and lateral border of the posterior S1 foramen. Negative aspiration confirmed. Solution injected in intermittent fashion, asking for systemic symptoms every 0.5cc of injectate. The needles were then removed and the area cleansed, making sure to leave some of the prepping solution back to take advantage of its long term bactericidal properties. Vitals:   02/22/17 1021 02/22/17 1030 02/22/17 1042 02/22/17 1050  BP: (!) 117/58 (!) 96/59 94/73 (!) 95/44  Pulse: 76 70 70 71  Resp: (!) 26 (!) 21 20 (!) 21  Temp:  98.7 F (37.1 C)    TempSrc:  Temporal    SpO2: 94% 91% 96% 96%  Weight:      Height:        Start Time: 1008 hrs. End Time: 1021 hrs.  Illustration of the posterior view of the lumbar spine and the posterior neural structures. Laminae of L2 through S1 are labeled. DPRL5, dorsal primary ramus of L5; DPRS1, dorsal primary ramus of S1; DPR3, dorsal primary ramus of L3; FJ, facet (zygapophyseal) joint L3-L4; I, inferior articular process of L4; LB1, lateral branch of dorsal primary ramus of L1; IAB, inferior articular branches from L3 medial branch (supplies L4-L5 facet joint); IBP, intermediate branch plexus; MB3, medial branch of dorsal primary ramus of L3; NR3, third lumbar nerve root; S, superior articular process of L5; SAB, superior articular branches from L4 (supplies L4-5 facet joint also); TP3, transverse process of L3.  Materials:  Needle(s) Type: Regular needle Gauge: 22G Length: 3.5-in Medication(s): We administered midazolam, fentaNYL, triamcinolone acetonide, lidocaine (PF),  ropivacaine (PF) 2 mg/mL (0.2%), triamcinolone acetonide, lidocaine (PF), and ropivacaine (PF) 2 mg/mL (0.2%). Please see chart orders for dosing details.  Imaging Guidance (Spinal):  Type of Imaging Technique: Fluoroscopy Guidance (Spinal) Indication(s): Assistance in needle guidance and placement for procedures requiring needle placement in or near specific anatomical locations not easily accessible without such assistance. Exposure Time: Please see nurses notes. Contrast: None used. Fluoroscopic Guidance: I was personally present during the use of fluoroscopy. "Tunnel Vision Technique" used to obtain the best possible view of the target area. Parallax error corrected before commencing the procedure. "Direction-depth-direction" technique used to introduce the needle under continuous pulsed fluoroscopy. Once target was reached, antero-posterior, oblique, and lateral fluoroscopic projection used confirm needle placement in all planes. Images permanently stored in EMR. Interpretation: No contrast injected. I personally interpreted the imaging intraoperatively. Adequate needle placement confirmed in multiple planes. Permanent images saved into the patient's record.  Antibiotic Prophylaxis:  Indication(s): None identified Antibiotic given: None  Post-operative Assessment:  EBL: None Complications: No immediate post-treatment complications observed by team, or reported by patient. Note: The patient tolerated the entire procedure well. A repeat set of vitals were taken after the procedure and the patient was kept under observation following institutional policy, for this type of procedure. Post-procedural neurological assessment was performed, showing return to baseline, prior to discharge. The patient was provided with post-procedure discharge instructions, including a section on how to identify potential problems. Should any problems arise concerning this procedure, the patient was given instructions  to immediately contact us, at any time, without hesitation. In any case, we plan to contact the patient by telephone for a follow-up status report regarding this interventional procedure. Comments:  No additional relevant information.  Plan of Care  Disposition: Discharge home  Discharge Date & Time: 02/22/2017; 1055 hrs.  Physician-requested Follow-up:  Return for post-procedure eval (in 2 wks), by  MD.  Future Appointments Date Time Provider [place]  02/28/2017 10:45 AM Harlin Heys, MD [facility] None  03/02/2017 9:00 AM Chrismon, Vickki Muff, PA [facility] None  03/07/2017 10:00 AM [name], FNP [facility] None  03/21/2017 11:00 AM Milinda Pointer, MD [facility] None  05/11/2017 11:00 AM Vevelyn Francois, NP [facility] None   Medications ordered for procedure: Meds ordered this encounter  Medications  . lactated ringers infusion 1,000 mL  . midazolam (VERSED) 5 MG/5ML injection 1-2 mg    Make sure Flumazenil is available in the pyxis when using this medication. If oversedation occurs, administer 0.2 mg IV over 15 sec. If after 45 sec no response, administer 0.2 mg again over 1 min; may repeat at 1 min intervals; not to exceed 4 doses (1 mg)  . fentaNYL (SUBLIMAZE) injection 25-50 mcg    Make sure Narcan is available in the pyxis when using this medication. In the event of respiratory depression (RR< 8/min): Titrate NARCAN (naloxone) in increments of 0.1 to 0.2 mg IV at 2-3 minute intervals, until desired degree of reversal.  . triamcinolone acetonide (KENALOG-40) injection 40 mg  . lidocaine (PF) (XYLOCAINE) 1 % injection 10 mL  . ropivacaine (PF) 2 mg/mL (0.2%) (NAROPIN) injection 9 mL  . triamcinolone acetonide (KENALOG-40) injection 40 mg  . lidocaine (PF) (XYLOCAINE) 1 % injection 10 mL  . ropivacaine (PF) 2 mg/mL (0.2%) (NAROPIN) injection 9 mL   Medications administered: We administered midazolam, fentaNYL, triamcinolone acetonide, lidocaine (PF), ropivacaine (PF) 2  mg/mL (0.2%), triamcinolone acetonide, lidocaine (PF), and ropivacaine (PF) 2 mg/mL (0.2%).  See the medical record for exact dosing, route, and time of administration.  Lab-work, Procedure(s), & Referral(s) Ordered: Orders Placed This Encounter  Procedures  . LUMBAR FACET(MEDIAL BRANCH NERVE BLOCK) MBNB  . DG C-Arm 1-60 Min-No Report  . Informed Consent Details: Transcribe to consent form and obtain patient signature  . Provider attestation of informed consent for procedure/surgical case  . Verify informed consent  . Discharge instructions  . Follow-up  . Oxygen therapy Mode or (Route): Nasal cannula; FiO2: 35%; Liters Per Minute: 2; Keep 02 saturation: Above 92%   Imaging Ordered: No results found for this or any previous visit. New Prescriptions   No medications on file   Primary Care Physician: Margo Common, PA Location: [facility] Note by: Kathlen Brunswick. Dossie [name], M.D, DABA, DABAPM, DABPM, DABIPP, FIPP Date: 02/22/2017; Time: 11:39 AM  Disclaimer:  Medicine is not an exact science. The only guarantee in medicine is that nothing is guaranteed. It is important to note that the decision to proceed with this intervention was based on the information collected from the patient. The Data and conclusions were drawn from the patient's questionnaire, the interview, and the physical examination. Because the information was provided in large part by the patient, it cannot be guaranteed that it has not been purposely or unconsciously manipulated. Every effort has been made to obtain as much relevant data as possible for this evaluation. It is important to note that the conclusions that lead to this procedure are derived in large part from the available data. Always take into account that the treatment will also be dependent on availability of resources and existing treatment guidelines, considered by other Pain Management Practitioners as being common knowledge and  practice, at the time of the intervention. For Medico-Legal purposes, it is also important to point out that variation in procedural techniques and pharmacological choices are the acceptable norm. The  indications, contraindications, technique, and results of the above procedure should only be interpreted and judged by a Board-Certified Interventional Pain Specialist with extensive familiarity and expertise in the same exact procedure and technique.  Instructions provided at this appointment: Patient Instructions   Post-Procedure instructions Instructions:  Apply ice: Fill a plastic sandwich bag with crushed ice. Cover it with a small towel and apply to injection site. Apply for 15 minutes then remove x 15 minutes. Repeat sequence on day of procedure, until you go to bed. The purpose is to minimize swelling and discomfort after procedure.  Apply heat: Apply heat to procedure site starting the day following the procedure. The purpose is to treat any soreness and discomfort from the procedure.  Food intake: Start with clear liquids (like water) and advance to regular food, as tolerated.   Physical activities: Keep activities to a minimum for the first 8 hours after the procedure.   Driving: If you have received any sedation, you are not allowed to drive for 24 hours after your procedure.  Blood thinner: Restart your blood thinner 6 hours after your procedure. (Only for those taking blood thinners)  Insulin: As soon as you can eat, you may resume your normal dosing schedule. (Only for those taking insulin)  Infection prevention: Keep procedure site clean and dry.  Post-procedure Pain Diary: Extremely important that this be done correctly and accurately. Recorded information will be used to determine the next step in treatment.  Pain evaluated is that of treated area only. Do not include pain from an untreated area.  Complete every hour, on the hour, for the initial 8 hours. Set an alarm to  help you do this part accurately.  Do not go to sleep and have it completed later. It will not be accurate.  Follow-up appointment: Keep your follow-up appointment after the procedure. Usually 2 weeks for most procedures. (6 weeks in the case of radiofrequency.) Bring you pain diary.  Expect:  From numbing medicine (AKA: Local Anesthetics): Numbness or decrease in pain.  Onset: Full effect within 15 minutes of injected.  Duration: It will depend on the type of local anesthetic used. On the average, 1 to 8 hours.   From steroids: Decrease in swelling or inflammation. Once inflammation is improved, relief of the pain will follow.  Onset of benefits: Depends on the amount of swelling present. The more swelling, the longer it will take for the benefits to be seen.   Duration: Steroids will stay in the system x 2 weeks. Duration of benefits will depend on multiple posibilities including persistent irritating factors.  From procedure: Some discomfort is to be expected once the numbing medicine wears off. This should be minimal if ice and heat are applied as instructed. Call if:  You experience numbness and weakness that gets worse with time, as opposed to wearing off.  New onset bowel or bladder incontinence. (Spinal procedures only)  Emergency Numbers:  [place] business hours (Monday - Thursday, 8:00 AM - 4:00 PM) (Friday, 9:00 AM - 12:00 Noon): [phone]  After hours: [phone] _____________________________________________________________________________________________  ____________________________________________________________________________________________  Medication Rules  Applies to: All patients receiving prescriptions (written or electronic).  Pharmacy of record: Pharmacy where electronic prescriptions will be sent. If written prescriptions are taken to a different pharmacy, please inform the nursing staff. The pharmacy listed in the electronic medical record  should be the one where you would like electronic prescriptions to be sent.  Prescription refills: Only during scheduled appointments. Applies to both, written  and electronic prescriptions.  NOTE: The following applies primarily to controlled substances (Opioid Pain Medications)  Patient's responsibilities: 1. Pain Pills: Bring all pain pills to every appointment (except for procedure appointments). 2. Pill Bottles: Bring pills in original pharmacy bottle. Always bring newest bottle. Bring bottle, even if empty. 3. Medication refills: You are responsible for knowing and keeping track of what medications you need refilled. The day before your appointment, write a list of all prescriptions that need to be refilled. Bring that list to your appointment and give it to the admitting nurse. Prescriptions will be written only during appointments. If you forget a medication, it will not be "Called in", "Faxed", or "electronically sent". You will need to get another appointment to get these prescribed. 4. Prescription Accuracy: You are responsible for carefully inspecting your prescriptions before leaving our office. Have the discharge nurse carefully go over each prescription with you, before taking them home. Make sure that your name is accurately spelled, that your address is correct. Check the name and dose of your medication to make sure it is accurate. Check the number of pills, and the written instructions to make sure they are clear and accurate. Make sure that you are given enough medication to last until your next medication refill appointment. 5. Taking Medication: Take medication as prescribed. Never take more pills than instructed. Never take medication more frequently than prescribed. Taking less pills or less frequently is permitted and encouraged, when it comes to controlled substances (written prescriptions).  6. Inform other Doctors: Always inform, all of your healthcare providers, of all the  medications you take. 7. Pain Medication from other Providers: You are not allowed to accept any additional pain medication from any other Doctor or Healthcare provider. There are two exceptions to this rule. (see below) In the event that you require additional pain medication, you are responsible for notifying us, as stated below. 8. Medication Agreement: You are responsible for carefully reading and following our Medication Agreement. This must be signed before receiving any prescriptions from our practice. Safely store a copy of your signed Agreement. Violations to the Agreement will result in no further prescriptions. (Additional copies of our Medication Agreement are available upon request.) 9. Laws, Rules, & Regulations: All patients are expected to follow all Federal and [employer], Rules, [employer]. Ignorance of the Laws does not constitute a valid excuse.  Exceptions: There are only two exceptions to the rule of not receiving pain medications from other Healthcare Providers. 1. Exception #1 (Emergencies): In the event of an emergency (i.e.: accident requiring emergency care), you are allowed to receive additional pain medication. However, you are responsible for: As soon as you are able, call our office [phone], at any time of the day or night, and leave a message stating your name, the date and nature of the emergency, and the name and dose of the medication prescribed. In the event that your call is answered by a member of our staff, make sure to document and save the date, time, and the name of the person that took your information.  2. Exception #2 (Planned Surgery): In the event that you are scheduled by another doctor or dentist to have any type of surgery or procedure, you are allowed (for a period no longer than 30 days), to receive additional pain medication, for the acute post-op pain. However, in this case, you are responsible for picking up a copy of our "Post-op Pain  Management for Surgeons" handout, and  giving it to your surgeon or dentist. This document is available at our office, and does not require an appointment to obtain it. Simply go to our office during business hours (Monday-Thursday from 8:00 AM to 4:00 PM) (Friday 8:00 AM to 12:00 Noon) or if you have a scheduled appointment with [place], prior to your surgery, and ask for it by name. In addition, you will need to provide [place] with your name, name of your surgeon, type of surgery, and date of procedure or surgery.  _____________________________________________________________________________________________Pain Score  Introduction: The pain score used by this practice is the Verbal Numerical Rating Scale (VNRS-11). This is an 11-point scale. It is for adults and children 10 years or older. There are significant differences in how the pain score is reported, used, and applied. Forget everything you learned in the past and learn this scoring system.  General Information: The scale should reflect your current level of pain. Unless you are specifically asked for the level of your worst pain, or your average pain. If you are asked for one of these two, then it should be understood that it is over the past 24 hours.  Basic Activities of Daily Living (ADL): Personal hygiene, dressing, eating, transferring, and using restroom.  Instructions: Most patients tend to report their level of pain as a combination of two factors, their physical pain and their psychosocial pain. This last one is also known as "suffering" and it is reflection of how physical pain affects you socially and psychologically. From now on, report them separately. From this point on, when asked to report your pain level, report only your physical pain. Use the following table for reference.  Pain Clinic Pain Levels (0-5/10)  Pain Level Score Description  No Pain 0   Mild pain 1 Nagging, annoying, but does not interfere with basic activities of daily  living (ADL). Patients are able to eat, bathe, get dressed, toileting (being able to get on and off the toilet and perform personal hygiene functions), transfer (move in and out of bed or a chair without assistance), and maintain continence (able to control bladder and bowel functions). Blood pressure and heart rate are unaffected. A normal heart rate for a healthy adult ranges from 60 to 100 bpm (beats per minute).   Mild to moderate pain 2 Noticeable and distracting. Impossible to hide from other people. More frequent flare-ups. Still possible to adapt and function close to normal. It can be very annoying and may have occasional stronger flare-ups. With discipline, patients may get used to it and adapt.   Moderate pain 3 Interferes significantly with activities of daily living (ADL). It becomes difficult to feed, bathe, get dressed, get on and off the toilet or to perform personal hygiene functions. Difficult to get in and out of bed or a chair without assistance. Very distracting. With effort, it can be ignored when deeply involved in activities.   Moderately severe pain 4 Impossible to ignore for more than a few minutes. With effort, patients may still be able to manage work or participate in some social activities. Very difficult to concentrate. Signs of autonomic nervous system discharge are evident: dilated pupils (mydriasis); mild sweating (diaphoresis); sleep interference. Heart rate becomes elevated (>115 bpm). Diastolic blood pressure (lower number) rises above 100 mmHg. Patients find relief in laying down and not moving.   Severe pain 5 Intense and extremely unpleasant. Associated with frowning face and frequent crying. Pain overwhelms the senses.  Ability to do any activity or maintain social  relationships becomes significantly limited. Conversation becomes difficult. Pacing back and forth is common, as getting into a comfortable position is nearly impossible. Pain wakes you up from deep sleep.  Physical signs will be obvious: pupillary dilation; increased sweating; goosebumps; brisk reflexes; cold, clammy hands and feet; nausea, vomiting or dry heaves; loss of appetite; significant sleep disturbance with inability to fall asleep or to remain asleep. When persistent, significant weight loss is observed due to the complete loss of appetite and sleep deprivation.  Blood pressure and heart rate becomes significantly elevated. Caution: If elevated blood pressure triggers a pounding headache associated with blurred vision, then the patient should immediately seek attention at an urgent or emergency care unit, as these may be signs of an impending stroke.    Emergency Department Pain Levels (6-10/10)  Emergency Room Pain 6 Severely limiting. Requires emergency care and should not be seen or managed at an outpatient pain management facility. Communication becomes difficult and requires great effort. Assistance to reach the emergency department may be required. Facial flushing and profuse sweating along with potentially dangerous increases in heart rate and blood pressure will be evident.   Distressing pain 7 Self-care is very difficult. Assistance is required to transport, or use restroom. Assistance to reach the emergency department will be required. Tasks requiring coordination, such as bathing and getting dressed become very difficult.   Disabling pain 8 Self-care is no longer possible. At this level, pain is disabling. The individual is unable to do even the most "basic" activities such as walking, eating, bathing, dressing, transferring to a bed, or toileting. Fine motor skills are lost. It is difficult to think clearly.   Incapacitating pain 9 Pain becomes incapacitating. Thought processing is no longer possible. Difficult to remember your own name. Control of movement and coordination are lost.   The worst pain imaginable 10 At this level, most patients pass out from pain. When this level is  reached, collapse of the autonomic nervous system occurs, leading to a sudden drop in blood pressure and heart rate. This in turn results in a temporary and dramatic drop in blood flow to the brain, leading to a loss of consciousness. Fainting is one of the body's self defense mechanisms. Passing out puts the brain in a calmed state and causes it to shut down for a while, in order to begin the healing process.    Summary: 1. Refer to this scale when providing [place] with your pain level. 2. Be accurate and careful when reporting your pain level. This will help with your care. 3. Over-reporting your pain level will lead to loss of credibility. 4. Even a level of 1/10 means that there is pain and will be treated at our facility. 5. High, inaccurate reporting will be documented as "Symptom Exaggeration", leading to loss of credibility and suspicions of possible secondary gains such as obtaining more narcotics, or wanting to appear disabled, for fraudulent reasons. 6. Only pain levels of 5 or below will be seen at our facility. 7. Pain levels of 6 and above will be sent to the Emergency Department and the appointment cancelled. _____________________________________________________________________________________________  Pain Management Discharge Instructions  General Discharge Instructions :  If you need to reach your doctor call: Monday-Friday 8:00 am - 4:00 pm at [phone] or toll free [phone].  After clinic hours [phone] to have operator reach doctor.  Bring all of your medication bottles to all your appointments in the pain clinic.  To cancel or reschedule your appointment with Pain Management  please remember to call 24 hours in advance to avoid a fee.  Refer to the educational materials which you have been given on: General Risks, I had my Procedure. Discharge Instructions, Post Sedation.  Post Procedure Instructions:  The drugs you were given will stay in your system until  tomorrow, so for the next 24 hours you should not drive, make any legal decisions or drink any alcoholic beverages.  You may eat anything you prefer, but it is better to start with liquids then soups and crackers, and gradually work up to solid foods.  Please notify your doctor immediately if you have any unusual bleeding, trouble breathing or pain that is not related to your normal pain.  Depending on the type of procedure that was done, some parts of your body may feel week and/or numb.  This usually clears up by tonight or the next day.  Walk with the use of an assistive device or accompanied by an adult for the 24 hours.  You may use ice on the affected area for the first 24 hours.  Put ice in a Ziploc bag and cover with a towel and place against area 15 minutes on 15 minutes off.  You may switch to heat after 24 hours.

## 2017-02-22 NOTE — Progress Notes (Signed)
Safety precautions to be maintained throughout the outpatient stay will include: orient to surroundings, keep bed in low position, maintain call bell within reach at all times, provide assistance with transfer out of bed and ambulation.  

## 2017-02-23 ENCOUNTER — Other Ambulatory Visit: Payer: Self-pay | Admitting: Family Medicine

## 2017-02-23 LAB — TOXASSURE SELECT 13 (MW), URINE

## 2017-02-23 MED ORDER — SERTRALINE HCL 100 MG PO TABS: ORAL_TABLET | ORAL | 4 refills | Status: DC

## 2017-02-23 NOTE — Telephone Encounter (Signed)
Patient states she is not taking Trazodone because she does not like how it makes her feel. She is requesting Zoloft 150 mg RX be sent to [facility].

## 2017-02-23 NOTE — Telephone Encounter (Signed)
Denies any needs at this time. Instructed to call if needed.

## 2017-02-23 NOTE — Telephone Encounter (Signed)
Will take the Trazodone off her medication list and change the Zoloft to 150 mg qd. Remind her to keep the 02-28-17 appointment for follow up.

## 2017-02-28 ENCOUNTER — Ambulatory Visit (INDEPENDENT_AMBULATORY_CARE_PROVIDER_SITE_OTHER): Payer: Medicare PPO | Admitting: Obstetrics and Gynecology

## 2017-02-28 ENCOUNTER — Encounter: Payer: Self-pay | Admitting: Family

## 2017-02-28 ENCOUNTER — Encounter: Payer: Self-pay | Admitting: Obstetrics and Gynecology

## 2017-02-28 ENCOUNTER — Ambulatory Visit: Payer: Medicare PPO | Attending: Family | Admitting: Family

## 2017-02-28 VITALS — BP 126/71 | HR 66 | Resp 20 | Ht 63.0 in | Wt 246.0 lb

## 2017-02-28 VITALS — BP 122/68 | HR 61 | Ht 63.0 in | Wt 246.3 lb

## 2017-02-28 DIAGNOSIS — R938 Abnormal findings on diagnostic imaging of other specified body structures: Secondary | ICD-10-CM

## 2017-02-28 DIAGNOSIS — M545 Low back pain: Secondary | ICD-10-CM | POA: Diagnosis not present

## 2017-02-28 DIAGNOSIS — G8929 Other chronic pain: Secondary | ICD-10-CM | POA: Insufficient documentation

## 2017-02-28 DIAGNOSIS — I5032 Chronic diastolic (congestive) heart failure: Secondary | ICD-10-CM | POA: Insufficient documentation

## 2017-02-28 DIAGNOSIS — Z7982 Long term (current) use of aspirin: Secondary | ICD-10-CM | POA: Diagnosis not present

## 2017-02-28 DIAGNOSIS — Z720 Tobacco use: Secondary | ICD-10-CM

## 2017-02-28 DIAGNOSIS — G4733 Obstructive sleep apnea (adult) (pediatric): Secondary | ICD-10-CM | POA: Insufficient documentation

## 2017-02-28 DIAGNOSIS — R9389 Abnormal findings on diagnostic imaging of other specified body structures: Secondary | ICD-10-CM

## 2017-02-28 DIAGNOSIS — F419 Anxiety disorder, unspecified: Secondary | ICD-10-CM | POA: Insufficient documentation

## 2017-02-28 DIAGNOSIS — F329 Major depressive disorder, single episode, unspecified: Secondary | ICD-10-CM | POA: Diagnosis not present

## 2017-02-28 DIAGNOSIS — Z79899 Other long term (current) drug therapy: Secondary | ICD-10-CM | POA: Diagnosis not present

## 2017-02-28 DIAGNOSIS — G5600 Carpal tunnel syndrome, unspecified upper limb: Secondary | ICD-10-CM | POA: Diagnosis not present

## 2017-02-28 DIAGNOSIS — Z9981 Dependence on supplemental oxygen: Secondary | ICD-10-CM | POA: Insufficient documentation

## 2017-02-28 DIAGNOSIS — I110 Hypertensive heart disease with heart failure: Secondary | ICD-10-CM | POA: Insufficient documentation

## 2017-02-28 DIAGNOSIS — N83209 Unspecified ovarian cyst, unspecified side: Secondary | ICD-10-CM | POA: Diagnosis not present

## 2017-02-28 DIAGNOSIS — F17210 Nicotine dependence, cigarettes, uncomplicated: Secondary | ICD-10-CM | POA: Diagnosis not present

## 2017-02-28 NOTE — Patient Instructions (Addendum)
Continue weighing daily and call for an overnight weight gain of > 2 pounds or a weekly weight gain of >5 pounds.  Double furosemide and potassium to twice daily for the next 3 days.  Have PCP check lab work on Thursday the 17th.

## 2017-02-28 NOTE — Progress Notes (Signed)
Patient ID: Nola Botkins, female    DOB: 07-14-1956, 61 y.o.   MRN: [id number]  HPI Ms Genrich is a 61 y/o female with a history of chronic low back pain, HTN, depression, COPD on long-term oxygen, anxiety, carpal tunnel syndrome, long-standing tobacco use and chronic heart failure.  Last echo was done 11/03/15 but unable to access results. Echo done on 02/19/15 showed an EF of 50% without valvular stenosis. Recent PFT's done on 01/18/16  Admitted 02/13/17 due to COPD exacerbation. Initially needed bipap and transitioned back to 2L of oxygen. Given prednisone and antibiotics for 5 days at discharge. Psychiatry consult done due to daughter's death. Recently admitted on 20-Nov-2016 due to COPD exacerbation. Initially treated with bipap and then baseline oxygen was resumed. Given IV steroids and discharged on oral prednisone. Was in the ED on 03/07/16 due to shoulder pain after a mechanical fall. Was treated as whiplash and was given mobic. Last admission was 11/02/15 due to COPD exacerbation.  She presents today for a follow-up visit with a chief complaint of pedal edema. She describes this as having been present for years with waxing and waning. She says that the swelling has gotten worse over the last few days. She has associated fatigue along with this.   Past Medical History:  Diagnosis Date  . Anxiety   . Carpal tunnel syndrome   . CHF (congestive heart failure) ([place])   . CHF (congestive heart failure) ([place])    1/18  . Chronic generalized abdominal pain   . Chronic rhinitis   . COPD (chronic obstructive pulmonary disease) ([place])   . DDD (degenerative disc disease), cervical   . Depression   . Edema   . Flu    1/18  . Gastritis   . Hematuria   . Hypertension   . Kidney stones   . Low back pain   . Lumbar radiculitis   . Malodorous urine   . Muscle weakness   . Obesity   . Renal cyst   . Sensory urge incontinence   . Tobacco abuse    Past Surgical History:  Procedure Laterality Date  .  ABDOMINAL HYSTERECTOMY    . CARPAL TUNNEL RELEASE Left   . TONSILLECTOMY    . TUBAL LIGATION     Family History  Problem Relation Age of Onset  . Heart disease Mother   . Stroke Mother   . Coronary artery disease Mother   . Lung cancer Sister   . Alcohol abuse Father   . Heart disease Father   . Prostate cancer Neg Hx   . Kidney cancer Neg Hx   . Bladder Cancer Neg Hx    Social History  Substance Use Topics  . Smoking status: Current Every Day Smoker    Packs/day: 1.00    Years: 40.00    Types: Cigarettes  . Smokeless tobacco: Never Used     Comment: first day of patch patient states she's trying  . Alcohol use No   Allergies  Allergen Reactions  . Codeine Nausea And Vomiting  . Meloxicam Other (See Comments)    Stomach pain   Prior to Admission medications   Medication Sig Start Date End Date Taking? Authorizing Provider  albuterol (PROVENTIL HFA;VENTOLIN HFA) 108 (90 Base) MCG/ACT inhaler Inhale 2 puffs into the lungs every 6 (six) hours as needed for wheezing. Reported on 11/03/2015 11/02/16  Yes Demetrios Loll, MD  ALPRAZolam Duanne Moron) 0.5 MG tablet Take 1 tablet (0.5 mg total) by mouth 2 (two)  times daily as needed for anxiety. 12/09/16  Yes Joriel Streety A, FNP  Aspirin-Caffeine (BC FAST PAIN RELIEF ARTHRITIS) 1000-65 MG PACK Take 1 packet by mouth every 6 (six) hours as needed (Pain).   Yes [provider]  bisoprolol-hydrochlorothiazide [facility]) 10-6.25 MG tablet TAKE 1 TABLET BY MOUTH DAILY 12/16/16  Yes Chrismon, Vickki Muff, PA  Cholecalciferol ([facility] D3) 2000 units CAPS Take 1 capsule by mouth daily.   Yes [provider]  dextromethorphan-guaiFENesin (TUSSIN DM) 10-100 MG/5ML liquid Take 5 mLs by mouth every 4 (four) hours as needed for cough. 02/16/17  Yes Mody, Ulice Bold, MD  Fluticasone-Salmeterol (ADVAIR DISKUS) 250-50 MCG/DOSE AEPB INHALE 1 PUFF INTO LUNGS TWICE A DAY 05/23/16  Yes [provider]  furosemide (LASIX) 40 MG tablet Take 1 tablet (40 mg  total) by mouth daily. 08/29/16  Yes Isaac Lacson, Otila Kluver A, FNP  HYDROcodone-acetaminophen (NORCO/VICODIN) 5-325 MG tablet Take 1 tablet by mouth every 8 (eight) hours as needed for severe pain. 02/16/17 03/18/17 Yes Milinda Pointer, MD  ipratropium-albuterol (DUONEB) 0.5-2.5 (3) MG/3ML SOLN Take 3 mLs by nebulization every 6 (six) hours as needed (shortness of breath). 11/02/16  Yes Demetrios Loll, MD  Menthol, Topical Analgesic, (BENGAY COLD THERAPY) 5 % GEL Apply 1 application topically daily.   Yes [provider]  nicotine polacrilex ([facility]S NICOTINE POLACRILEX) 2 MG gum Take 1 each (2 mg total) by mouth as needed for smoking cessation. 02/16/17  Yes Mody, Ulice Bold, MD  OXYGEN Inhale 2.5 L into the lungs continuous. Reported on 11/03/2015   Yes [provider]  potassium chloride SA (K-DUR,KLOR-CON) 20 MEQ tablet Take 1 tablet (20 mEq total) by mouth daily. 09/16/16  Yes Yechezkel Fertig, Otila Kluver A, FNP  ranitidine (ZANTAC) 150 MG tablet Take 150 mg by mouth 2 (two) times daily.   Yes [provider]  roflumilast (DALIRESP) 500 MCG TABS tablet Take 500 mcg by mouth daily.   Yes [provider]  sertraline (ZOLOFT) 100 MG tablet TAKE 1.5 TABLET (150 MG TOTAL) BY MOUTH DAILY. 02/23/17  Yes Chrismon, Vickki Muff, PA    Review of Systems  Constitutional: Positive for fatigue. Negative for appetite change.  HENT: Negative for congestion, postnasal drip and sore throat.   Eyes: Negative.   Respiratory: Negative for cough, shortness of breath and wheezing.   Cardiovascular: Positive for leg swelling. Negative for chest pain and palpitations.  Gastrointestinal: Negative for abdominal distention, abdominal pain and nausea.  Endocrine: Negative.   Genitourinary: Negative.   Musculoskeletal: Positive for back pain. Negative for neck pain.  Skin: Negative.   Allergic/Immunologic: Negative.   Neurological: Negative for dizziness and light-headedness.  Hematological: Negative for adenopathy. Does not  bruise/bleed easily.  Psychiatric/Behavioral: Positive for agitation and dysphoric mood. Negative for sleep disturbance (wearing oxygen & bipap at night) and suicidal ideas. The patient is not nervous/anxious.    Vitals:   02/28/17 1224  BP: 126/71  Pulse: 66  Resp: 20  SpO2: 96%  Weight: 246 lb (111.6 kg)  Height: _0  (1.6 m)   Wt Readings from Last 3 Encounters:  02/28/17 246 lb (111.6 kg)  02/28/17 246 lb 5 oz (111.7 kg)  02/22/17 257 lb (116.6 kg)    Lab Results  Component Value Date   CREATININE 0.73 02/14/2017   CREATININE 0.81 02/13/2017   CREATININE 0.74 11/01/2016   Physical Exam  Constitutional: She is oriented to person, place, and time. She appears well-developed and well-nourished.  HENT:  Head: Normocephalic and atraumatic.  Neck: Normal  range of motion. Neck supple. No JVD present.  Cardiovascular: Normal rate and regular rhythm.   Pulmonary/Chest: Effort normal. She has no wheezes. She has rales in the right lower field.  Abdominal: Soft. She exhibits no distension. There is no tenderness.  Musculoskeletal: She exhibits edema (1+ pitting edema in bilateral lower legs). She exhibits no tenderness.  Neurological: She is alert and oriented to person, place, and time.  Skin: Skin is warm and dry.  Psychiatric: Her behavior is normal. Thought content normal. Her affect is angry. She exhibits a depressed mood.  tearful  Nursing note and vitals reviewed.     Assessment & Plan:  1: Chronic heart failure with preserved ejection fraction- - NYHA class III - continues to be mildly volume overloaded today - weight down 9 pounds since last here  - continues to take 50m furosemide daily along with 238m potassium daily; double furosemide/potassium for the next 3 days - BMP to be drawn at PCP's office on 03/02/17 - monitor sodium content carefully - weigh daily and call for an overnight weight gain of >2 pounds or a weekly weight gain of >5 pounds - walking more   - saw cardiologist (CClayborn Bigness10/16/17. She has to schedule f/u with him.   2: Obstructive sleep apnea- - wearing bipap nightly - oxygen at 3-4L around the clock - last saw pulmonology (FRaul Delon 06/27/16; she has to schedule f/u with him  3: Tobacco use- - currently wearing a nicotine patch - alternates between the patch, gum and actual cigarettes; doesn't smoke while wearing the patch - removes herself from the oxygen when smoking - complete cessation discussed for 3 minutes with her  4: Reactive depression- - daughter suddenly died 01/20/04/2018nd patient admits that she's depressed and angry - currently staying at her daughter's home which she admits is very difficult; planning on moving out in the next month to either a hotel or short term on the truck with her husband - psych saw patient in the hospital but is unable to get established with anyone locally due to insurance - [place]; otherwise, will need to [facility] to [facility] - she adamantly denies any suicidal thoughts/plans and gives a verbal contract that if she starts to have these thoughts, that she will call someone and she agrees - emotional support given  Patient did not bring her medications nor a list. Each medication was verbally reviewed with the patient and she was encouraged to bring the bottles to every visit to confirm accuracy of list.  Return here in 1 week or sooner for any questions/problems before then

## 2017-02-28 NOTE — Progress Notes (Signed)
HPI:      Theresa [name] is a 61 y.o. [phone] who LMP was No LMP recorded. Patient is postmenopausal.  Subjective:   She presents today For follow-up of ovarian cyst and increased endometrial thickness. She has had no vaginal bleeding. Has multiple other medical problems including significant pulmonary disease.    Hx: The following portions of the patient's history were reviewed and updated as appropriate:             She  has a past medical history of Anxiety; Carpal tunnel syndrome; CHF (congestive heart failure) ([place]); CHF (congestive heart failure) ([place]); Chronic generalized abdominal pain; Chronic rhinitis; COPD (chronic obstructive pulmonary disease) (HCC); DDD (degenerative disc disease), cervical; Depression; Edema; Flu; Gastritis; Hematuria; Hypertension; Kidney stones; Low back pain; Lumbar radiculitis; Malodorous urine; Muscle weakness; Obesity; Renal cyst; Sensory urge incontinence; and Tobacco abuse. She  does not have any pertinent problems on file. She  has a past surgical history that includes Tubal ligation; Abdominal hysterectomy; Tonsillectomy; and Carpal tunnel release (Left). Her family history includes Alcohol abuse in her father; Coronary artery disease in her mother; Heart disease in her father and mother; Lung cancer in her sister; Stroke in her mother. She  reports that she has been smoking Cigarettes.  She has a 40.00 pack-year smoking history. She has never used smokeless tobacco. She reports that she does not drink alcohol or use drugs. She is allergic to codeine and meloxicam.       Review of Systems:  Review of Systems  Constitutional: Denied constitutional symptoms, night sweats, recent illness, fatigue, fever, insomnia and weight loss.  Eyes: Denied eye symptoms, eye pain, photophobia, vision change and visual disturbance.  Ears/Nose/Throat/Neck: Denied ear, nose, throat or neck symptoms, hearing loss, nasal discharge, sinus congestion and sore throat.   Cardiovascular: Denied cardiovascular symptoms, arrhythmia, chest pain/pressure, edema, exercise intolerance, orthopnea and palpitations.  Respiratory: Denied pulmonary symptoms, asthma, pleuritic pain, productive sputum, cough, dyspnea and wheezing.  Gastrointestinal: Denied, gastro-esophageal reflux, melena, nausea and vomiting.  Genitourinary:3 Denied genitourinary symptoms including symptomatic vaginal discharge, pelvic relaxation issues, and urinary complaints.  Musculoskeletal: Denied musculoskeletal symptoms, stiffness, swelling, muscle weakness and myalgia.  Dermatologic: Denied dermatology symptoms, rash and scar.  Neurologic: Denied neurology symptoms, dizziness, headache, neck pain and syncope.  Psychiatric: Denied psychiatric symptoms, anxiety and depression.  Endocrine: Denied endocrine symptoms including hot flashes and night sweats.   Meds:   Current Outpatient Prescriptions on File Prior to Visit  Medication Sig Dispense Refill  . albuterol (PROVENTIL HFA;VENTOLIN HFA) 108 (90 Base) MCG/ACT inhaler Inhale 2 puffs into the lungs every 6 (six) hours as needed for wheezing. Reported on 11/03/2015 1 Inhaler 2  . ALPRAZolam (XANAX) 0.5 MG tablet Take 1 tablet (0.5 mg total) by mouth 2 (two) times daily as needed for anxiety. 60 tablet 0  . Aspirin-Caffeine (BC FAST PAIN RELIEF ARTHRITIS) 1000-65 MG PACK Take 1 packet by mouth every 6 (six) hours as needed (Pain).    . bisoprolol-hydrochlorothiazide (ZIAC) 10-6.25 MG tablet TAKE 1 TABLET BY MOUTH DAILY 90 tablet 1  . Cholecalciferol ([employer] D3) 2000 units CAPS Take 1 capsule by mouth daily.    Marland Kitchen dextromethorphan-guaiFENesin (TUSSIN DM) 10-100 MG/5ML liquid Take 5 mLs by mouth every 4 (four) hours as needed for cough. 118 mL 0  . Fluticasone-Salmeterol (ADVAIR DISKUS) 250-50 MCG/DOSE AEPB INHALE 1 PUFF INTO LUNGS TWICE A DAY    . furosemide (LASIX) 40 MG tablet Take 1 tablet (40 mg total) by mouth daily.  90 tablet 4  .  HYDROcodone-acetaminophen (NORCO/VICODIN) 5-325 MG tablet Take 1 tablet by mouth every 8 (eight) hours as needed for severe pain. 90 tablet 0  . ipratropium-albuterol (DUONEB) 0.5-2.5 (3) MG/3ML SOLN Take 3 mLs by nebulization every 6 (six) hours as needed (shortness of breath). 360 mL 3  . Menthol, Topical Analgesic, (BENGAY COLD THERAPY) 5 % GEL Apply 1 application topically daily.    . nicotine polacrilex (CVS NICOTINE POLACRILEX) 2 MG gum Take 1 each (2 mg total) by mouth as needed for smoking cessation. 100 tablet 0  . OXYGEN Inhale 2.5 L into the lungs continuous. Reported on 11/03/2015    . potassium chloride SA (K-DUR,KLOR-CON) 20 MEQ tablet Take 1 tablet (20 mEq total) by mouth daily. 90 tablet 4  . ranitidine (ZANTAC) 150 MG tablet Take 150 mg by mouth 2 (two) times daily.    . roflumilast (DALIRESP) 500 MCG TABS tablet Take 500 mcg by mouth daily.    . sertraline (ZOLOFT) 100 MG tablet TAKE 1.5 TABLET (150 MG TOTAL) BY MOUTH DAILY. 135 tablet 4   No current facility-administered medications on file prior to visit.     Objective:     Vitals:   02/28/17 1047  BP: 122/68  Pulse: 61            Ultrasound results discussed and reviewed directly with the patient.  Assessment:    [id number] Patient Active Problem List   Diagnosis Date Noted  . Grief at loss of child 02/14/2017  . Acute respiratory failure with hypoxia ([place]) 02/13/2017  . Lumbar facet hypertrophy (multilevel) 11/17/2016  . Lumbar spinal stenosis (L4-5) 11/17/2016  . 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) 11/17/2016  . 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 Lumbosacral radiculopathy at L4 11/17/2016  . Chronic hip pain (Location of Primary Source of Pain) (Left) 11/01/2016  . Chronic hip pain (Location of Primary Source of Pain) (Right) 11/01/2016  . Acute respiratory failure ([place]) 10/25/2016  . Supplemental oxygen dependent 08/01/2016  . Sleep apnea 06/02/2016  . Long term  current use of opiate analgesic 01/12/2016  . Long term prescription opiate use 01/12/2016  . Opiate use (15 MME/Day) 01/12/2016  . Lumbar facet syndrome (Location of Primary Source of Pain) (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) 01/12/2016  . Chronic sacroiliac joint pain (Bilateral) 01/12/2016  . Vitamin D deficiency 12/30/2015  . Chronic hip pain (Location of Primary Source of Pain) (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) 12/28/2015  . 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 Obesity, Class III, BMI 40-49.9 (morbid obesity) ([place]) 12/28/2015  . 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[place]) 11/19/2015  . Coagulation disorder ([place]) 10/22/2015  . Chronic pain syndrome (significant psychosocial component) 09/21/2015  . 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 09/21/2015  . Encounter for therapeutic drug level monitoring 09/16/2015  . Encounter for chronic pain management 09/16/2015  . Pain management 09/16/2015  . Neurogenic pain 09/16/2015  . Neuropathic pain 09/16/2015  . Musculoskeletal pain 09/16/2015  . Lumbar spondylosis (L4-5) 09/16/2015  . Chronic low back pain (Location of Primary Source of Pain) (Left) 09/16/2015  . Chronic lower extremity pain (Location of Secondary source of pain) (Left) 09/16/2015  . Chronic lumbar radicular pain (Location of Secondary source of pain) (L4 & S1 Dermatome) (Left) 09/16/2015  . Chronic shoulder pain (Right side) 09/16/2015  . Chronic upper extremity pain (Right-sided) 09/16/2015  . Cervical radiculitis (Right side) 09/16/2015  . Abnormal x-ray of lumbar spine 09/16/2015  . Chronic diastolic heart failure (  [place]) 07/15/2015  . Chest pain 07/15/2015  . Tobacco abuse 07/15/2015  . Blood clotting disorder ([place]) 04/21/2015  . Decreased motor strength 07/16/2014  . Clinical depression 07/16/2014  . Chronic rhinitis 07/16/2014  . Carpal tunnel syndrome 07/16/2014  . Anxiety state 07/16/2014  . Neuritis or radiculitis due to rupture of lumbar  intervertebral disc 07/02/2014  . DDD (degenerative disc disease), lumbar 07/02/2014  . CAFL (chronic airflow limitation) ([place]) 02/27/2014  . Chronic obstructive pulmonary disease ([place]) 02/27/2014  . Essential (primary) hypertension 03/27/2008     1. Cyst of ovary, unspecified laterality   2. Increased endometrial stripe thickness     Resolution of ovarian cyst and normal menopausal endometrial stripe thickness. Patient without pelvic pain or postmenopausal bleeding.   Plan:            1.  No further workup necessary for cyst or endometrium. If she begins having postmenopausal bleeding or other pelvic symptoms she will contact us.  2. Recommend annual examination with Pap smear mammography possible DEXA as patient is somewhat delinquent in routine care.         F/U  Return in about 1 month (around 03/31/2017) for Annual Physical. I spent 17 minutes with this patient of which greater than 50% was spent discussing results of ultrasound as noted above and routine screening tests necessary for health maintenance.   Finis Bud, M.D. 02/28/2017 11:05 AM

## 2017-03-02 ENCOUNTER — Encounter: Payer: Self-pay | Admitting: Family Medicine

## 2017-03-02 ENCOUNTER — Ambulatory Visit (INDEPENDENT_AMBULATORY_CARE_PROVIDER_SITE_OTHER): Payer: Medicare PPO | Admitting: Family Medicine

## 2017-03-02 VITALS — BP 120/78 | HR 75 | Temp 98.3°F | Wt 246.4 lb

## 2017-03-02 DIAGNOSIS — I5032 Chronic diastolic (congestive) heart failure: Secondary | ICD-10-CM | POA: Diagnosis not present

## 2017-03-02 DIAGNOSIS — J449 Chronic obstructive pulmonary disease, unspecified: Secondary | ICD-10-CM | POA: Diagnosis not present

## 2017-03-02 DIAGNOSIS — Z634 Disappearance and death of family member: Secondary | ICD-10-CM

## 2017-03-02 NOTE — Progress Notes (Signed)
Patient: Theresa [name] Female    DOB: 1956/01/23   61 y.o.   MRN: [id number] Visit Date: 03/02/2017  Today's Provider: [name], PA   Chief Complaint  Patient presents with  . Hospitalization Follow-up   Subjective:    HPI  Follow up Hospitalization  Patient was admitted to [facility] on 02/13/2017 and discharged on 02/16/2017. She was treated for acute respiratory failure with hypoxia and grief at loss of child. Treatment for this included started Tussin DM, Levaquin, Nicotine Polacrilex, Prednisone and Trazodone. Telephone follow up was done on 02/17/2017 She reports fair compliance with treatment. She states she is unable to take Trazodone due to side effects. She reports this condition is slightly Improved. Patient reports EMS was called to home on 02/27/2017. She states her friend called because she was rattling, turned "grayish" color and O2 was low. Patient refused to go to the ER.   ------------------------------------------------------------------------------------  Patient Active Problem List   Diagnosis Date Noted  . Grief at loss of child 02/14/2017  . Lumbar facet hypertrophy (multilevel) 11/17/2016  . Lumbar spinal stenosis (L4-5) 11/17/2016  . 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) 11/17/2016  . 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 Lumbosacral radiculopathy at L4 11/17/2016  . Chronic hip pain (Location of Primary Source of Pain) (Left) 11/01/2016  . Chronic hip pain (Location of Primary Source of Pain) (Right) 11/01/2016  . Supplemental oxygen dependent 08/01/2016  . Sleep apnea 06/02/2016  . Long term current use of opiate analgesic 01/12/2016  . Long term prescription opiate use 01/12/2016  . Opiate use (15 MME/Day) 01/12/2016  . Lumbar facet syndrome (Location of Primary Source of Pain) (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) 01/12/2016  . Chronic sacroiliac joint pain (Bilateral) 01/12/2016  . Vitamin D deficiency 12/30/2015  . Chronic hip  pain (Location of Primary Source of Pain) (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) 12/28/2015  . 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 Obesity, Class III, BMI 40-49.9 (morbid obesity) ([place]) 12/28/2015  . 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[place]) 11/19/2015  . Coagulation disorder ([place]) 10/22/2015  . Chronic pain syndrome (significant psychosocial component) 09/21/2015  . 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 09/21/2015  . Encounter for therapeutic drug level monitoring 09/16/2015  . Encounter for chronic pain management 09/16/2015  . Pain management 09/16/2015  . Neurogenic pain 09/16/2015  . Neuropathic pain 09/16/2015  . Musculoskeletal pain 09/16/2015  . Lumbar spondylosis (L4-5) 09/16/2015  . Chronic low back pain (Location of Primary Source of Pain) (Left) 09/16/2015  . Chronic lower extremity pain (Location of Secondary source of pain) (Left) 09/16/2015  . Chronic lumbar radicular pain (Location of Secondary source of pain) (L4 & S1 Dermatome) (Left) 09/16/2015  . Chronic shoulder pain (Right side) 09/16/2015  . Chronic upper extremity pain (Right-sided) 09/16/2015  . Cervical radiculitis (Right side) 09/16/2015  . Abnormal x-ray of lumbar spine 09/16/2015  . Chronic diastolic heart failure ([place]) 07/15/2015  . Chest pain 07/15/2015  . Tobacco abuse 07/15/2015  . Blood clotting disorder ([place]) 04/21/2015  . Decreased motor strength 07/16/2014  . Clinical depression 07/16/2014  . Chronic rhinitis 07/16/2014  . Carpal tunnel syndrome 07/16/2014  . Anxiety state 07/16/2014  . Neuritis or radiculitis due to rupture of lumbar intervertebral disc 07/02/2014  . DDD (degenerative disc disease), lumbar 07/02/2014  . CAFL (chronic airflow limitation) ([place]) 02/27/2014  . Chronic obstructive pulmonary disease ([place]) 02/27/2014  . Essential (primary) hypertension 03/27/2008   Past Surgical History:  Procedure Laterality Date  . ABDOMINAL  HYSTERECTOMY    . CARPAL  TUNNEL RELEASE Left   . TONSILLECTOMY    . TUBAL LIGATION     Family History  Problem Relation Age of Onset  . Heart disease Mother   . Stroke Mother   . Coronary artery disease Mother   . Lung cancer Sister   . Alcohol abuse Father   . Heart disease Father   . Prostate cancer Neg Hx   . Kidney cancer Neg Hx   . Bladder Cancer Neg Hx    Allergies  Allergen Reactions  . Codeine Nausea And Vomiting  . Meloxicam Other (See Comments)    Stomach pain     Previous Medications   ALBUTEROL (PROVENTIL HFA;VENTOLIN HFA) 108 (90 BASE) MCG/ACT INHALER    Inhale 2 puffs into the lungs every 6 (six) hours as needed for wheezing. Reported on 11/03/2015   ALPRAZOLAM (XANAX) 0.5 MG TABLET    Take 1 tablet (0.5 mg total) by mouth 2 (two) times daily as needed for anxiety.   ASPIRIN-CAFFEINE (BC FAST PAIN RELIEF ARTHRITIS) 1000-65 MG PACK    Take 1 packet by mouth every 6 (six) hours as needed (Pain).   BISOPROLOL-HYDROCHLOROTHIAZIDE (ZIAC) 10-6.25 MG TABLET    TAKE 1 TABLET BY MOUTH DAILY   CHOLECALCIFEROL (CVS D3) 2000 UNITS CAPS    Take 1 capsule by mouth daily.   DEXTROMETHORPHAN-GUAIFENESIN (TUSSIN DM) 10-100 MG/5ML LIQUID    Take 5 mLs by mouth every 4 (four) hours as needed for cough.   FLUTICASONE-SALMETEROL (ADVAIR DISKUS) 250-50 MCG/DOSE AEPB    INHALE 1 PUFF INTO LUNGS TWICE A DAY   FUROSEMIDE (LASIX) 40 MG TABLET    Take 1 tablet (40 mg total) by mouth daily.   HYDROCODONE-ACETAMINOPHEN (NORCO/VICODIN) 5-325 MG TABLET    Take 1 tablet by mouth every 8 (eight) hours as needed for severe pain.   IPRATROPIUM-ALBUTEROL (DUONEB) 0.5-2.5 (3) MG/3ML SOLN    Take 3 mLs by nebulization every 6 (six) hours as needed (shortness of breath).   MENTHOL, TOPICAL ANALGESIC, (BENGAY COLD THERAPY) 5 % GEL    Apply 1 application topically daily.   NICOTINE POLACRILEX (CVS NICOTINE POLACRILEX) 2 MG GUM    Take 1 each (2 mg total) by mouth as needed for smoking cessation.   OXYGEN    Inhale 2.5 L into the  lungs continuous. Reported on 11/03/2015   POTASSIUM CHLORIDE SA (K-DUR,KLOR-CON) 20 MEQ TABLET    Take 1 tablet (20 mEq total) by mouth daily.   RANITIDINE (ZANTAC) 150 MG TABLET    Take 150 mg by mouth 2 (two) times daily.   ROFLUMILAST (DALIRESP) 500 MCG TABS TABLET    Take 500 mcg by mouth daily.   SERTRALINE (ZOLOFT) 100 MG TABLET    TAKE 1.5 TABLET (150 MG TOTAL) BY MOUTH DAILY.    Review of Systems  Constitutional: Negative.   Respiratory: Positive for cough, shortness of breath and wheezing.   Cardiovascular: Negative.     Social History  Substance Use Topics  . Smoking status: Current Every Day Smoker    Packs/day: 1.00    Years: 40.00    Types: Cigarettes  . Smokeless tobacco: Never Used     Comment: first day of patch patient states she's trying  . Alcohol use No   Objective:   BP 120/78 (BP Location: Right Arm, Patient Position: Sitting, Cuff Size: Large)   Pulse 75   Temp 98.3 F (36.8 C) (Oral)   Wt 246 lb 6.4 oz (  111.8 kg)   SpO2 95%   BMI 43.65 kg/m  Wt Readings from Last 3 Encounters:  03/02/17 246 lb 6.4 oz (111.8 kg)  02/28/17 246 lb (111.6 kg)  02/28/17 246 lb 5 oz (111.7 kg)    Physical Exam  Constitutional: She is oriented to person, place, and time. She appears well-developed and well-nourished. No distress.  HENT:  Head: Normocephalic and atraumatic.  Right Ear: Hearing normal.  Left Ear: Hearing normal.  Nose: Nose normal.  Eyes: Conjunctivae and lids are normal. Right eye exhibits no discharge. Left eye exhibits no discharge. No scleral icterus.  Cardiovascular: Normal rate and regular rhythm.   Pulmonary/Chest:  Distant breath sounds without rales today.  Musculoskeletal: Normal range of motion.  Neurological: She is alert and oriented to person, place, and time.  Skin: Skin is intact. No lesion and no rash noted.  Psychiatric: She has a normal mood and affect. Her speech is normal and behavior is normal. Thought content normal.        Assessment & Plan:     1. Chronic diastolic heart failure (HCC) Was seen at the Heart Failure clinic on 02-28-17 and increased Lasix to 80 mg with KCL 20 meq BID for 3 days then follow up there after metabolic panel done today. Will see Dr. Clayborn Bigness this time. No peripheral edema today and weight is stable. Recheck labs. - CBC with Differential/Platelet - Comprehensive metabolic panel  2. CAFL (chronic airflow limitation) (HCC) Was hospitalized 02-13-17 to 02-16-17 for acute exacerbation of COPD with acute respiratory failure with hypoxia. Continue oxygen by nasal cannula at 2 LPM and BiPAP for sleep or flares. Still taking  Advair, Proventil and Duoneb by nebulizer. Working on smoking cessation with nicotine patches starting today. Finished the prednisone and Levaquin given at discharge. Has an appointment to see her pulmonologist in the next couple day. No worsening of dyspnea today. Check routine labs. - CBC with Differential/Platelet  3. Bereavement Still taking the Sertraline and has Xanax handy if she has a panic attack. Feels she is coping in her "own way" but would like her friends and family to not "hover" over her so much. Recheck here or go to psychologist or psychiatrist as needed.

## 2017-03-03 LAB — COMPREHENSIVE METABOLIC PANEL
ALT: 17 IU/L (ref 0–32)
AST: 12 IU/L (ref 0–40)
Albumin/Globulin Ratio: 1.9 (ref 1.2–2.2)
Albumin: 4.4 g/dL (ref 3.5–5.5)
Alkaline Phosphatase: 75 IU/L (ref 39–117)
BUN/Creatinine Ratio: 22 (ref 9–23)
BUN: 17 mg/dL (ref 6–24)
Bilirubin Total: 0.4 mg/dL (ref 0.0–1.2)
CO2: 29 mmol/L (ref 18–29)
Calcium: 9.7 mg/dL (ref 8.7–10.2)
Chloride: 98 mmol/L (ref 96–106)
Creatinine, Ser: 0.79 mg/dL (ref 0.57–1.00)
GFR calc Af Amer: 95 mL/min/{1.73_m2} (ref >59)
GFR calc non Af Amer: 82 mL/min/{1.73_m2} (ref >59)
Globulin, Total: 2.3 g/dL (ref 1.5–4.5)
Glucose: 100 mg/dL — ABNORMAL HIGH (ref 65–99)
Potassium: 4.6 mmol/L (ref 3.5–5.2)
Sodium: 141 mmol/L (ref 134–144)
Total Protein: 6.7 g/dL (ref 6.0–8.5)

## 2017-03-03 LAB — CBC WITH DIFFERENTIAL/PLATELET
Basophils Absolute: 0 10*3/uL (ref 0.0–0.2)
Basos: 0 %
EOS (ABSOLUTE): 0 10*3/uL (ref 0.0–0.4)
Eos: 0 %
Hematocrit: 48.8 % — ABNORMAL HIGH (ref 34.0–46.6)
Hemoglobin: 15.7 g/dL (ref 11.1–15.9)
Immature Grans (Abs): 0.1 10*3/uL (ref 0.0–0.1)
Immature Granulocytes: 1 %
Lymphocytes Absolute: 1.7 10*3/uL (ref 0.7–3.1)
Lymphs: 15 %
MCH: 30.8 pg (ref 26.6–33.0)
MCHC: 32.2 g/dL (ref 31.5–35.7)
MCV: 96 fL (ref 79–97)
Monocytes Absolute: 0.8 10*3/uL (ref 0.1–0.9)
Monocytes: 7 %
Neutrophils Absolute: 8.6 10*3/uL — ABNORMAL HIGH (ref 1.4–7.0)
Neutrophils: 77 %
Platelets: 206 10*3/uL (ref 150–379)
RBC: 5.09 x10E6/uL (ref 3.77–5.28)
RDW: 14.9 % (ref 12.3–15.4)
WBC: 11.2 10*3/uL — ABNORMAL HIGH (ref 3.4–10.8)

## 2017-03-07 ENCOUNTER — Ambulatory Visit: Payer: Self-pay | Admitting: Family

## 2017-03-16 DIAGNOSIS — N3941 Urge incontinence: Secondary | ICD-10-CM | POA: Diagnosis not present

## 2017-03-16 DIAGNOSIS — M5030 Other cervical disc degeneration, unspecified cervical region: Secondary | ICD-10-CM | POA: Diagnosis not present

## 2017-03-16 DIAGNOSIS — I110 Hypertensive heart disease with heart failure: Secondary | ICD-10-CM | POA: Diagnosis not present

## 2017-03-16 DIAGNOSIS — I5032 Chronic diastolic (congestive) heart failure: Secondary | ICD-10-CM | POA: Diagnosis not present

## 2017-03-16 DIAGNOSIS — F329 Major depressive disorder, single episode, unspecified: Secondary | ICD-10-CM | POA: Diagnosis not present

## 2017-03-16 DIAGNOSIS — E669 Obesity, unspecified: Secondary | ICD-10-CM | POA: Diagnosis not present

## 2017-03-16 DIAGNOSIS — J9621 Acute and chronic respiratory failure with hypoxia: Secondary | ICD-10-CM | POA: Diagnosis not present

## 2017-03-16 DIAGNOSIS — J441 Chronic obstructive pulmonary disease with (acute) exacerbation: Secondary | ICD-10-CM | POA: Diagnosis not present

## 2017-03-17 ENCOUNTER — Telehealth: Payer: Self-pay | Admitting: Family Medicine

## 2017-03-17 NOTE — Telephone Encounter (Signed)
Acknowledged. Ask patient to discuss this change with the [facility] Failure Clinic [facility]) the next time she goes.

## 2017-03-17 NOTE — Telephone Encounter (Signed)
Jesse with [facility] called to advise pt is being discharged from home health with all goals met.  CB#[phone]/MW

## 2017-03-21 ENCOUNTER — Ambulatory Visit: Payer: Medicare PPO | Admitting: Pain Medicine

## 2017-03-26 IMAGING — CR DG ABDOMEN 1V
1 series · 2 of 2 positions shown · non-contrast
Comparison: 10/31/2016

CLINICAL DATA: Pt states she has left side kidney stone and a cyst
on the right ovary. Pain right flank with blood in urine. shielded

EXAM:
ABDOMEN - 1 VIEW

[Series 1: dg abd 1 view · 0.14mm/px · 2 of 2 slices shown]
[im 1/2]
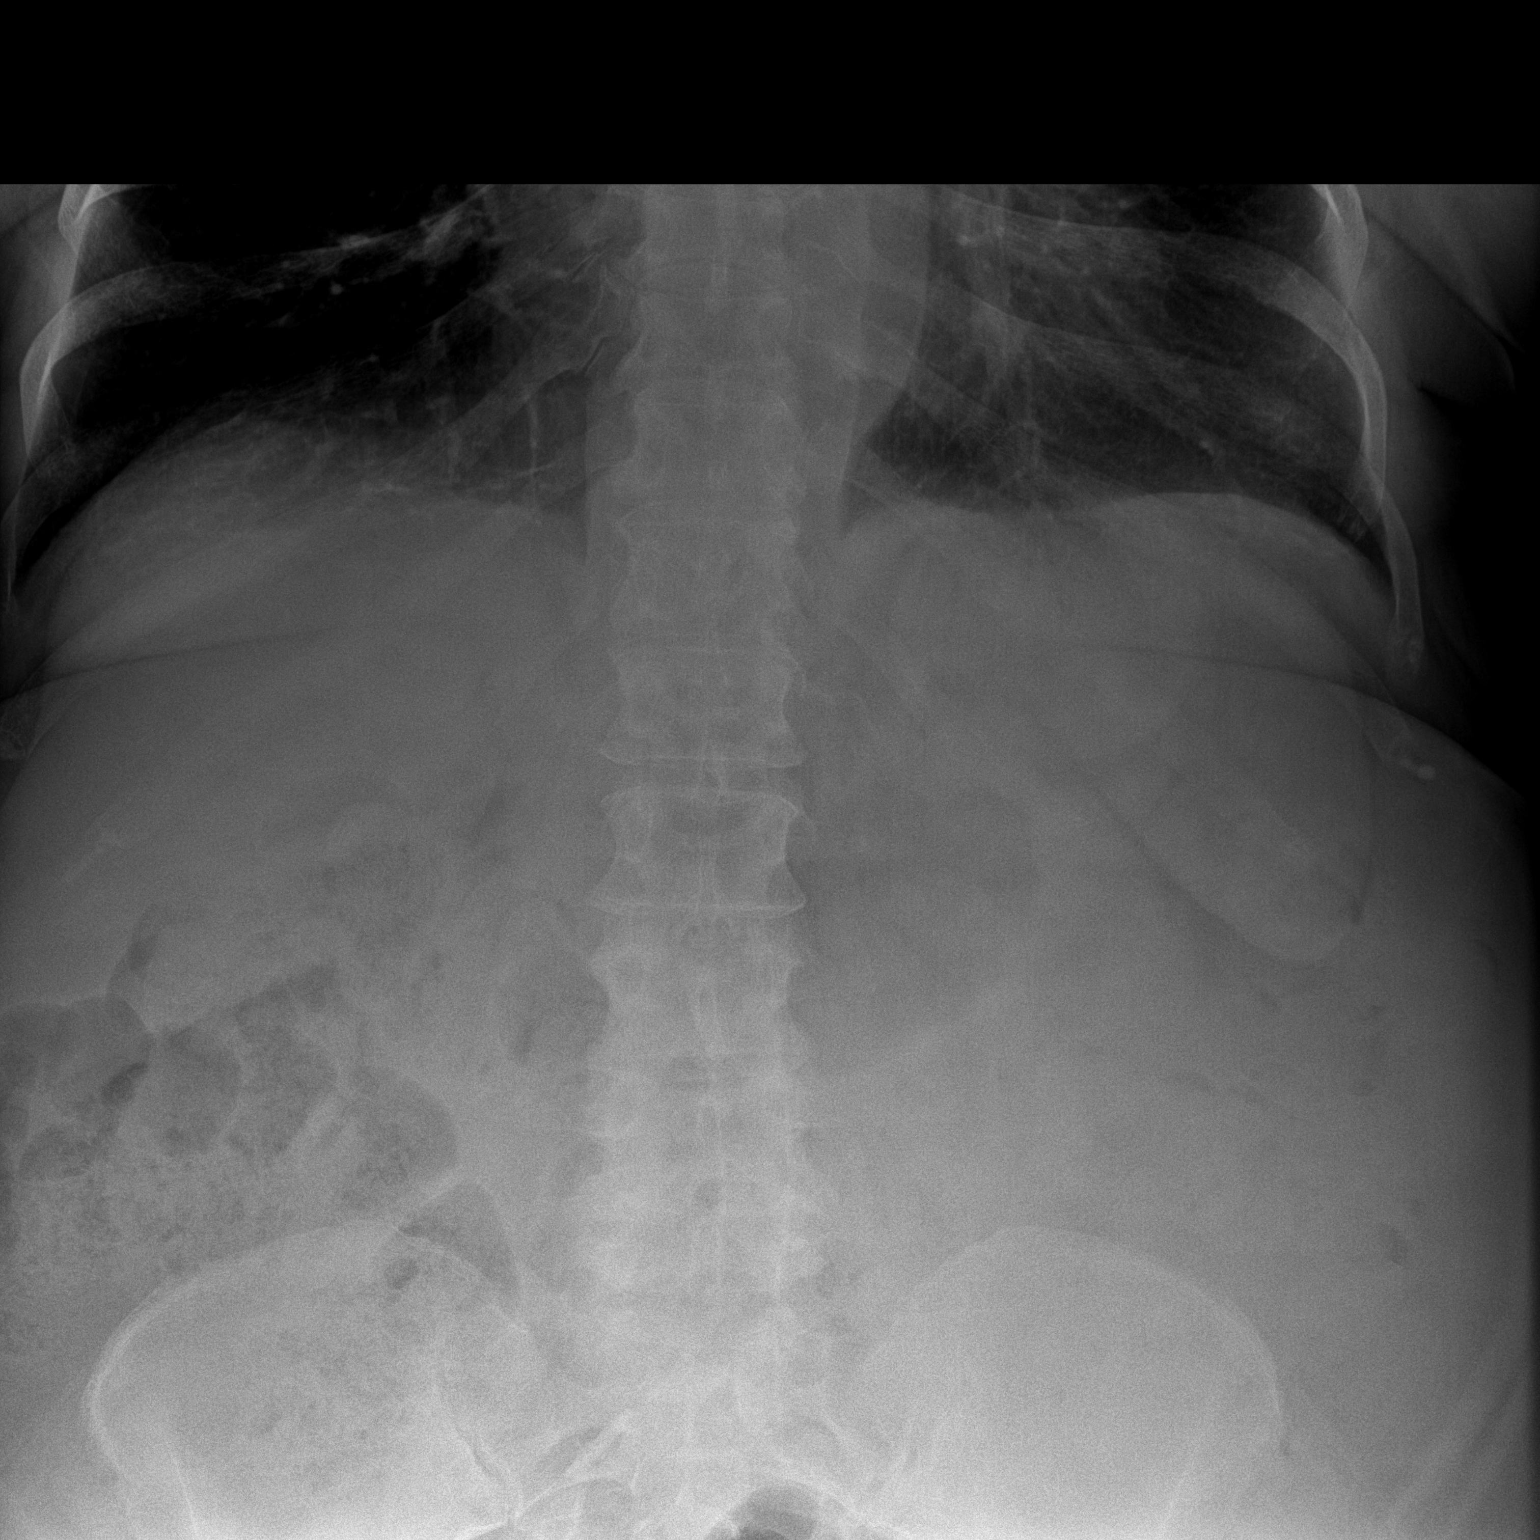
[im 2/2]
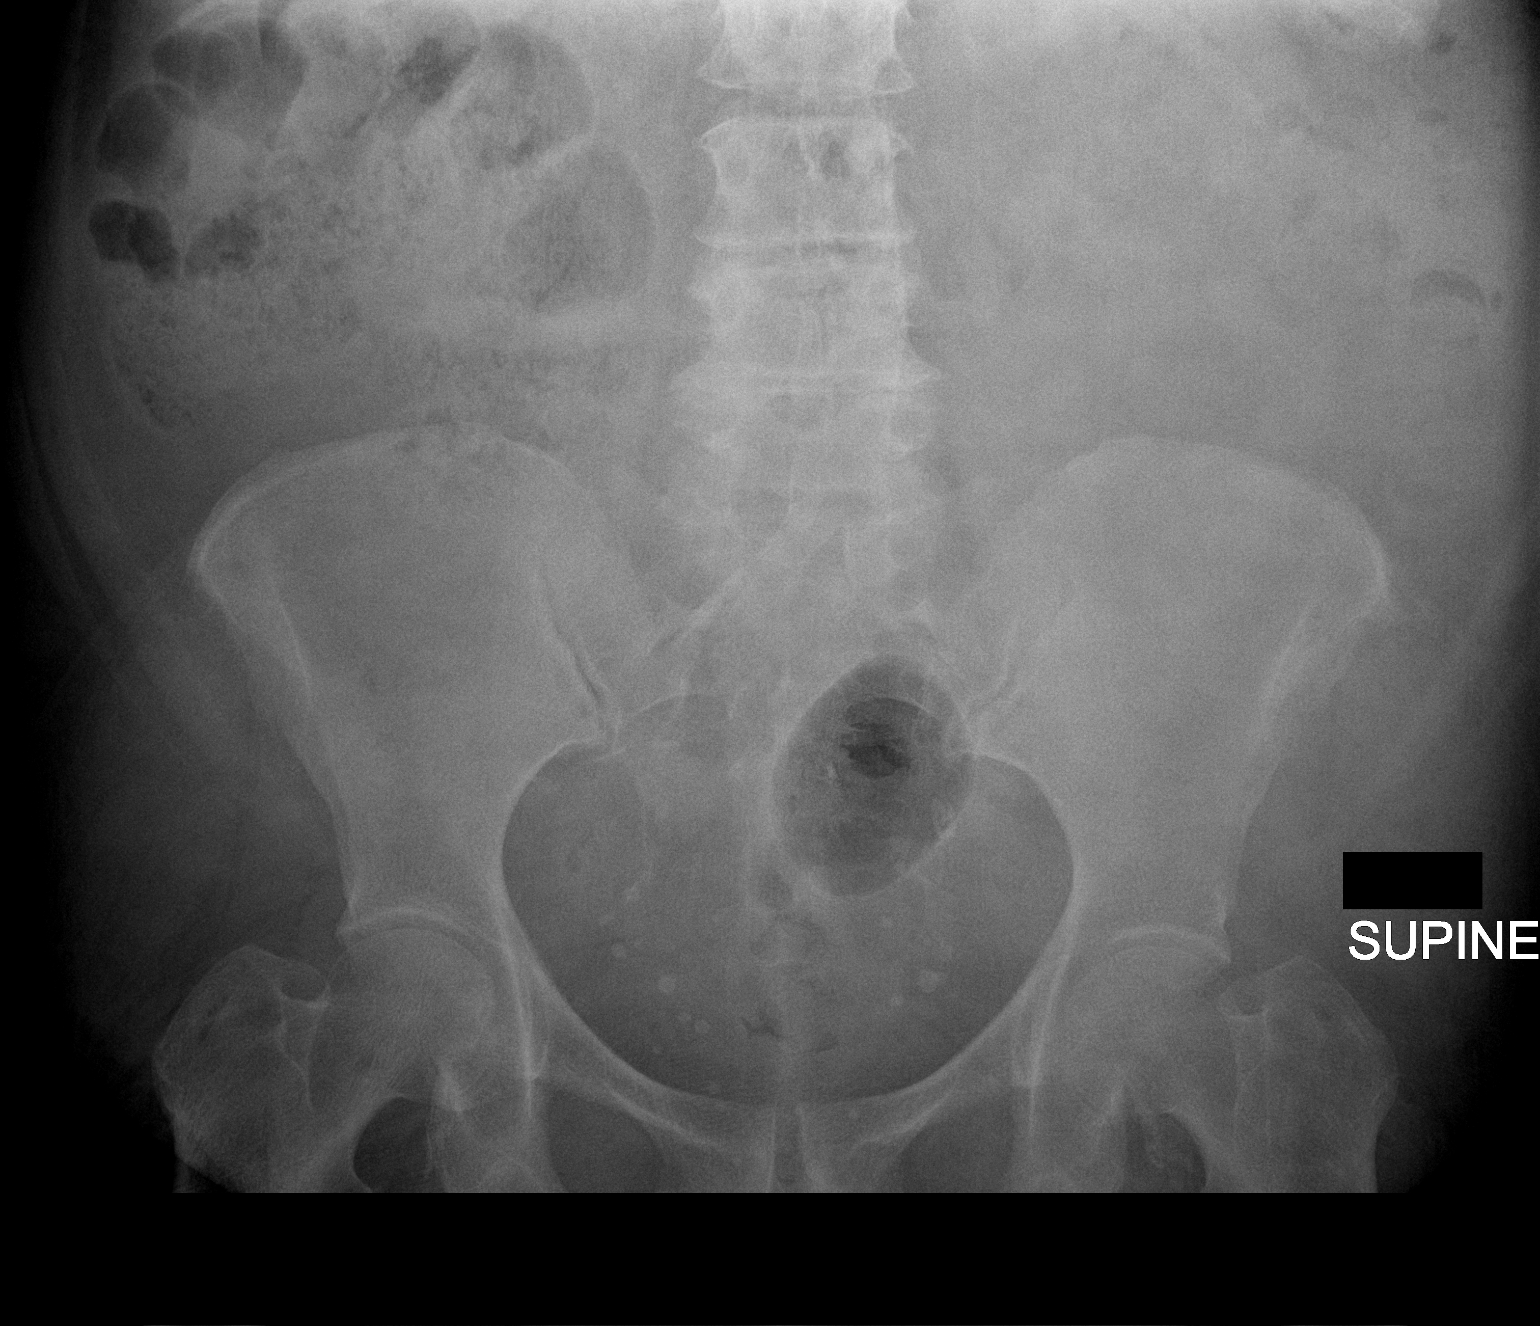

[2 of 2 positions shown; findings below may reference images not displayed]

FINDINGS: Bowel gas pattern is nonobstructive. There is moderate stool
throughout nondilated loops of colon. Multiple calcifications are
identified within the pelvis, majority of which are consistent with
phleboliths. Distal urinary tract stone would be difficult to
exclude given the large number of calculi.
IMPRESSION: 1. No visible upper urinary tract stones.
2. Numerous calcifications in the pelvis are consistent phleboliths
and/or urinary tract calculi.

## 2017-03-31 ENCOUNTER — Encounter: Payer: Self-pay | Admitting: Obstetrics and Gynecology

## 2017-04-04 ENCOUNTER — Ambulatory Visit (INDEPENDENT_AMBULATORY_CARE_PROVIDER_SITE_OTHER): Payer: BLUE CROSS/BLUE SHIELD | Admitting: Obstetrics and Gynecology

## 2017-04-04 ENCOUNTER — Encounter: Payer: Self-pay | Admitting: Obstetrics and Gynecology

## 2017-04-04 VITALS — BP 107/64 | HR 84 | Ht 63.0 in | Wt 237.2 lb

## 2017-04-04 DIAGNOSIS — Z6841 Body Mass Index (BMI) 40.0 and over, adult: Secondary | ICD-10-CM | POA: Diagnosis not present

## 2017-04-04 DIAGNOSIS — Z0000 Encounter for general adult medical examination without abnormal findings: Secondary | ICD-10-CM | POA: Diagnosis not present

## 2017-04-04 NOTE — Progress Notes (Signed)
HPI:      [name] is a 61 y.o. [phone] who LMP was No LMP recorded. Patient is postmenopausal.  Subjective:   She presents today for her annual examination.  She has had no issues with postmenopausal bleeding or pelvic pain. She has no complaints today.    Hx: The following portions of the patient's history were reviewed and updated as appropriate:             She  has a past medical history of Anxiety; Carpal tunnel syndrome; CHF (congestive heart failure) ([place]); CHF (congestive heart failure) ([place]); Chronic generalized abdominal pain; Chronic rhinitis; COPD (chronic obstructive pulmonary disease) (HCC); DDD (degenerative disc disease), cervical; Depression; Edema; Flu; Gastritis; Hematuria; Hypertension; Kidney stones; Low back pain; Lumbar radiculitis; Malodorous urine; Muscle weakness; Obesity; Renal cyst; Sensory urge incontinence; and Tobacco abuse. She  does not have any pertinent problems on file. She  has a past surgical history that includes Tubal ligation; Abdominal hysterectomy; Tonsillectomy; and Carpal tunnel release (Left). Her family history includes Alcohol abuse in her father; Coronary artery disease in her mother; Heart disease in her father and mother; Lung cancer in her sister; Stroke in her mother. She  reports that she has been smoking Cigarettes.  She has a 40.00 pack-year smoking history. She has never used smokeless tobacco. She reports that she does not drink alcohol or use drugs. She is allergic to codeine and meloxicam.       Review of Systems:  Review of Systems  Constitutional: Denied constitutional symptoms, night sweats, recent illness, fatigue, fever, insomnia and weight loss.  Eyes: Denied eye symptoms, eye pain, photophobia, vision change and visual disturbance.  Ears/Nose/Throat/Neck: Denied ear, nose, throat or neck symptoms, hearing loss, nasal discharge, sinus congestion and sore throat.  Cardiovascular: Denied cardiovascular symptoms,  arrhythmia, chest pain/pressure, edema, exercise intolerance, orthopnea and palpitations.  Respiratory: Denied pulmonary symptoms, asthma, pleuritic pain, productive sputum, cough, dyspnea and wheezing.  Gastrointestinal: Denied, gastro-esophageal reflux, melena, nausea and vomiting.  Genitourinary: Denied genitourinary symptoms including symptomatic vaginal discharge, pelvic relaxation issues, and urinary complaints.  Musculoskeletal: Denied musculoskeletal symptoms, stiffness, swelling, muscle weakness and myalgia.  Dermatologic: Denied dermatology symptoms, rash and scar.  Neurologic: Denied neurology symptoms, dizziness, headache, neck pain and syncope.  Psychiatric: Denied psychiatric symptoms, anxiety and depression.  Endocrine: Denied endocrine symptoms including hot flashes and night sweats.   Meds:   Current Outpatient Prescriptions on File Prior to Visit  Medication Sig Dispense Refill  . albuterol (PROVENTIL HFA;VENTOLIN HFA) 108 (90 Base) MCG/ACT inhaler Inhale 2 puffs into the lungs every 6 (six) hours as needed for wheezing. Reported on 11/03/2015 1 Inhaler 2  . ALPRAZolam (XANAX) 0.5 MG tablet Take 1 tablet (0.5 mg total) by mouth 2 (two) times daily as needed for anxiety. (Patient taking differently: Take 0.25 mg by mouth 2 (two) times daily as needed for anxiety. ) 60 tablet 0  . Aspirin-Caffeine (BC FAST PAIN RELIEF ARTHRITIS) 1000-65 MG PACK Take 1 packet by mouth every 6 (six) hours as needed (Pain).    . bisoprolol-hydrochlorothiazide (ZIAC) 10-6.25 MG tablet TAKE 1 TABLET BY MOUTH DAILY 90 tablet 1  . Cholecalciferol ([employer] D3) 2000 units CAPS Take 1 capsule by mouth daily.    . Fluticasone-Salmeterol (ADVAIR DISKUS) 250-50 MCG/DOSE AEPB INHALE 1 PUFF INTO LUNGS TWICE A DAY    . furosemide (LASIX) 40 MG tablet Take 1 tablet (40 mg total) by mouth daily. (Patient taking differently: Take 40 mg by mouth 2 (two)  times daily. ) 90 tablet 4  . ipratropium-albuterol (DUONEB) 0.5-2.5  (3) MG/3ML SOLN Take 3 mLs by nebulization every 6 (six) hours as needed (shortness of breath). 360 mL 3  . Menthol, Topical Analgesic, (BENGAY COLD THERAPY) 5 % GEL Apply 1 application topically daily.    . nicotine polacrilex ([employer] NICOTINE POLACRILEX) 2 MG gum Take 1 each (2 mg total) by mouth as needed for smoking cessation. 100 tablet 0  . OXYGEN Inhale 2.5 L into the lungs continuous. Reported on 11/03/2015    . potassium chloride SA (K-DUR,KLOR-CON) 20 MEQ tablet Take 1 tablet (20 mEq total) by mouth daily. (Patient taking differently: Take 20 mEq by mouth 2 (two) times daily. ) 90 tablet 4  . ranitidine (ZANTAC) 150 MG tablet Take 150 mg by mouth 2 (two) times daily.    . roflumilast (DALIRESP) 500 MCG TABS tablet Take 500 mcg by mouth daily.    . sertraline (ZOLOFT) 100 MG tablet TAKE 1.5 TABLET (150 MG TOTAL) BY MOUTH DAILY. 135 tablet 4  . dextromethorphan-guaiFENesin (TUSSIN DM) 10-100 MG/5ML liquid Take 5 mLs by mouth every 4 (four) hours as needed for cough. (Patient not taking: Reported on 04/04/2017) 118 mL 0  . HYDROcodone-acetaminophen (NORCO/VICODIN) 5-325 MG tablet Take 1 tablet by mouth every 8 (eight) hours as needed for severe pain. 90 tablet 0   No current facility-administered medications on file prior to visit.     Objective:     Vitals:   04/04/17 1335  BP: 107/64  Pulse: 84              Physical examination General NAD, Conversant  HEENT Atraumatic; Op clear with mmm.  Normo-cephalic. Pupils reactive. Anicteric sclerae  Thyroid/Neck Smooth without nodularity or enlargement. Normal ROM.  Neck Supple.  Skin No rashes, lesions or ulceration. Normal palpated skin turgor. No nodularity.  Breasts: No masses or discharge.  Symmetric.  No axillary adenopathy.  Lungs: Clear to auscultation.No rales or wheezes. Normal Respiratory effort, no retractions.  Heart: NSR.  No murmurs or rubs appreciated. No periferal edema  Abdomen: Soft.  Non-tender.  No masses.  No HSM. No  hernia  Extremities: Moves all appropriately.  Normal ROM for age. No lymphadenopathy.  Neuro: Oriented to PPT.  Normal mood. Normal affect.     Pelvic:   Vulva: Normal appearance.  No lesions.  Vagina: No lesions or abnormalities noted.  Support: Normal pelvic support.  Urethra No masses tenderness or scarring.  Meatus Normal size without lesions or prolapse.  Cervix: Normal appearance.  No lesions.  Anus: Normal exam.  No lesions.  Perineum: Normal exam.  No lesions.        Bimanual   Uterus: Normal size.  Non-tender.  Mobile.  AV.  Adnexae: No masses.  Non-tender to palpation.  Cul-de-sac: Negative for abnormality.   Exam very limited by patient body habitus.   Assessment:    [id number] Patient Active Problem List   Diagnosis Date Noted  . Grief at loss of child 02/14/2017  . Lumbar facet hypertrophy (multilevel) 11/17/2016  . Lumbar spinal stenosis (L4-5) 11/17/2016  . 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) 11/17/2016  . 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 Lumbosacral radiculopathy at L4 11/17/2016  . Chronic hip pain (Location of Primary Source of Pain) (Left) 11/01/2016  . Chronic hip pain (Location of Primary Source of Pain) (Right) 11/01/2016  . Supplemental oxygen dependent 08/01/2016  . Sleep apnea 06/02/2016  . Long term current  use of opiate analgesic 01/12/2016  . Long term prescription opiate use 01/12/2016  . Opiate use (15 MME/Day) 01/12/2016  . Lumbar facet syndrome (Location of Primary Source of Pain) (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) 01/12/2016  . Chronic sacroiliac joint pain (Bilateral) 01/12/2016  . Vitamin D deficiency 12/30/2015  . Chronic hip pain (Location of Primary Source of Pain) (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) 12/28/2015  . 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 Obesity, Class III, BMI 40-49.9 (morbid obesity) ([place]) 12/28/2015  . 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[place]) 11/19/2015  . Coagulation disorder ([place])  10/22/2015  . Chronic pain syndrome (significant psychosocial component) 09/21/2015  . 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 09/21/2015  . Encounter for therapeutic drug level monitoring 09/16/2015  . Encounter for chronic pain management 09/16/2015  . Pain management 09/16/2015  . Neurogenic pain 09/16/2015  . Neuropathic pain 09/16/2015  . Musculoskeletal pain 09/16/2015  . Lumbar spondylosis (L4-5) 09/16/2015  . Chronic low back pain (Location of Primary Source of Pain) (Left) 09/16/2015  . Chronic lower extremity pain (Location of Secondary source of pain) (Left) 09/16/2015  . Chronic lumbar radicular pain (Location of Secondary source of pain) (L4 & S1 Dermatome) (Left) 09/16/2015  . Chronic shoulder pain (Right side) 09/16/2015  . Chronic upper extremity pain (Right-sided) 09/16/2015  . Cervical radiculitis (Right side) 09/16/2015  . Abnormal x-ray of lumbar spine 09/16/2015  . Chronic diastolic heart failure ([place]) 07/15/2015  . Chest pain 07/15/2015  . Tobacco abuse 07/15/2015  . Blood clotting disorder ([place]) 04/21/2015  . Decreased motor strength 07/16/2014  . Clinical depression 07/16/2014  . Chronic rhinitis 07/16/2014  . Carpal tunnel syndrome 07/16/2014  . Anxiety state 07/16/2014  . Neuritis or radiculitis due to rupture of lumbar intervertebral disc 07/02/2014  . DDD (degenerative disc disease), lumbar 07/02/2014  . CAFL (chronic airflow limitation) ([place]) 02/27/2014  . Chronic obstructive pulmonary disease ([place]) 02/27/2014  . Essential (primary) hypertension 03/27/2008     1. Encounter for annual physical exam   2. Class 3 severe obesity due to excess calories with serious comorbidity and body mass index (BMI) of 40.0 to 44.9 in adult [name] [place])        Plan:            1.  Basic Screening Recommendations The basic screening recommendations for asymptomatic women were discussed with the patient during her visit.  The age-appropriate  recommendations were discussed with her and the rational for the tests reviewed.  When I am informed by the patient that another primary care physician has previously obtained the age-appropriate tests and they are up-to-date, only outstanding tests are ordered and referrals given as necessary.  Abnormal results of tests will be discussed with her when all of her results are completed. Pap smear performed, mammogram ordered, DEXA ordered. Orders Orders Placed This Encounter  Procedures  . DG Bone Density  . MM DIGITAL SCREENING BILATERAL     Meds ordered this encounter  Medications  . ALPRAZolam (XANAX) 0.25 MG tablet    Sig: TK 1 T PO BID PRN FOR SLEEP    Refill:  1  . HYDROcodone-acetaminophen (NORCO/VICODIN) 5-325 MG tablet    Sig: TK 1 T PO Q 8 H PRN FOR SEVERE PAIN    Refill:  0        F/U  Return in about 1 year (around 04/04/2018) for Annual Physical, We will contact her with any abnormal test results.  Finis Bud, M.D. 04/04/2017 2:21 PM

## 2017-04-06 LAB — PAP IG AND HPV HIGH-RISK
HPV, high-risk: POSITIVE — AB
PAP Smear Comment: 0

## 2017-04-10 ENCOUNTER — Telehealth: Payer: Self-pay

## 2017-04-10 NOTE — Telephone Encounter (Signed)
-----  Message from Harlin Heys, MD sent at 04/09/2017  4:12 PM EDT ----- Based on these results, please have her schedule a visit with me.

## 2017-04-10 NOTE — Telephone Encounter (Signed)
mychart message sent.

## 2017-04-10 NOTE — Progress Notes (Signed)
Patient's Name: Theresa [name]  MRN: [id number]  Referring Provider: Margo Common, [place]  DOB: [birth year]  PCP: Margo Common, PA  DOS: 04/11/2017  Note by: Kathlen Brunswick. Dossie Arbour, MD  Service setting: Ambulatory outpatient  Specialty: Interventional Pain Management  Location: [facility] (AMB) Pain Management Facility    Patient type: Established   Primary Reason(s) for Visit: Encounter for post-procedure evaluation of chronic illness with mild to moderate exacerbation CC: Back Pain (low and equal bilaterally) and Back Pain (right and midback)  HPI  [name] is a 61 y.o. year old, female patient, who comes today for a post-procedure evaluation. She has Neuritis or radiculitis due to rupture of lumbar intervertebral disc; Decreased motor strength; Essential (primary) hypertension; Clinical depression; DDD (degenerative disc disease), lumbar; CAFL (chronic airflow limitation) ([place]); Chronic rhinitis; Carpal tunnel syndrome; Blood clotting disorder ([place]); Anxiety state; Chronic diastolic heart failure ([place]); Chest pain; Tobacco abuse; Encounter for therapeutic drug level monitoring; Encounter for chronic pain management; Pain management; Neurogenic pain; Neuropathic pain; Musculoskeletal pain; Lumbar spondylosis (L4-5); Chronic low back pain (Location of Primary Source of Pain) (Left); Chronic lower extremity pain (Location of Secondary source of pain) (Left); Chronic lumbar radicular pain (L4 & S1 Dermatome) (Left); Chronic shoulder pain (Right side); Chronic upper extremity pain (Right-sided); Cervical radiculitis (Right side); Abnormal x-ray of lumbar spine; Chronic pain syndrome (significant psychosocial component); 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; Chronic obstructive pulmonary disease ([place]); Coagulation disorder ([place]); Chronic hip pain (Location of Tertiary Source of Pain) (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); 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); Obesity, Class III, BMI 40-49.9 (morbid  obesity) ([place]); 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[place]); Vitamin D deficiency; Long term current use of opiate analgesic; Long term prescription opiate use; Opiate use (15 MME/Day); Lumbar facet syndrome (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); Chronic sacroiliac joint pain (Bilateral); Sleep apnea; Supplemental oxygen dependent; Lumbar facet hypertrophy (multilevel); Lumbar spinal stenosis (L4-5); 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 Her primarily concern today is the Back Pain (low and equal bilaterally) and Back Pain (right and midback)  Pain Assessment: Location: Lower Back Onset: More than a month ago Duration: Chronic pain Quality:  (pulling and tugging sensation) Severity: 4 /10 (self-reported pain score)  Note:             Reported level is compatible with observation.       Effect on ADL: needs assistance with household cleaning when she has flare up of pain Timing: Intermittent Modifying factors: sitting, medications, procedures,   [name] comes in today for post-procedure evaluation after the treatment done on 02/22/2017.  Further details on both, my assessment(s), as well as the proposed treatment plan, please see below.  Post-Procedure Assessment  02/22/2017 Procedure: Diagnostic bilateral lumbar facet block #2  under fluoro and IV sedation Pre-procedure pain score:  4/10 Post-procedure pain score: 2/10 50% Influential Factors: BMI: 41.98 kg/m Intra-procedural challenges: None observed Assessment challenges: None detected         Post-procedural adverse reactions or complications: None reported Reported side-effects: None  Sedation: Sedation provided. When no sedatives are used, the analgesic levels obtained are directly associated to the effectiveness of the local anesthetics. However, when sedation is provided, the level of analgesia  obtained during the initial 1 hour following the intervention, is believed to be the result of a combination of factors. These factors may include, but are not limited to: 1. The effectiveness  of the local anesthetics used. 2. The effects of the analgesic(s) and/or anxiolytic(s) used. 3. The degree of discomfort experienced by the patient at the time of the procedure. 4. The patients ability and reliability in recalling and recording the events. 5. The presence and influence of possible secondary gains and/or psychosocial factors. Reported result: Relief experienced during the 1st hour after the procedure: 100 % (Ultra-Short Term Relief) Interpretative annotation: Analgesia during this period is likely to be Local Anesthetic and/or IV Sedative (Analgesic/Anxiolitic) related.          Effects of local anesthetic: The analgesic effects attained during this period are directly associated to the localized infiltration of local anesthetics and therefore cary significant diagnostic value as to the etiological location, or anatomical origin, of the pain. Expected duration of relief is directly dependent on the pharmacodynamics of the local anesthetic used. Long-acting (4-6 hours) anesthetics used.  Reported result: Relief during the next 4 to 6 hour after the procedure: 100 % (Short-Term Relief) Interpretative annotation: Complete relief would suggest area to be the source of the pain.          Long-term benefit: Defined as the period of time past the expected duration of local anesthetics. With the possible exception of prolonged sympathetic blockade from the local anesthetics, benefits during this period are typically attributed to, or associated with, other factors such as analgesic sensory neuropraxia, antiinflammatory effects, or beneficial biochemical changes provided by agents other than the local anesthetics Reported result: Extended relief following procedure: 50 % (currently) (Long-Term  Relief) Interpretative annotation: Good relief. This could suggest inflammation to be a significant component in the etiology to the pain.          Current benefits: Defined as persistent relief that continues at this point in time.   Reported results: Treated area: >50 %       Interpretative annotation: Partial benefit. This would suggest further treatment needed  Interpretation: Results would suggest a successful diagnostic intervention. The patient has failed to respond to conservative therapies including over-the-counter medications, anti-inflammatories, muscle relaxants, membrane stabilizers, opioids, physical therapy, modalities such as heat and ice, as well as more invasive techniques such as nerve blocks. Because Ms. Flanery did attain more than 50% relief of the pain during a series of diagnostic blocks conducted in separate occasions, I believe it is medically necessary to proceed with Radiofrequency Ablation, in order to attempt gaining longer relief.  Laboratory Chemistry  Inflammation Markers Lab Results  Component Value Date   CRP 1.1 (H) 09/17/2015   ESRSEDRATE 5 09/17/2015   (CRP: Acute Phase) (ESR: Chronic Phase) Renal Function Markers Lab Results  Component Value Date   BUN 17 03/02/2017   CREATININE 0.79 03/02/2017   GFRAA 95 03/02/2017   GFRNONAA 82 03/02/2017   Hepatic Function Markers Lab Results  Component Value Date   AST 12 03/02/2017   ALT 17 03/02/2017   ALBUMIN 4.4 03/02/2017   ALKPHOS 75 03/02/2017   Electrolytes Lab Results  Component Value Date   NA 141 03/02/2017   K 4.6 03/02/2017   CL 98 03/02/2017   CALCIUM 9.7 03/02/2017   MG 2.1 02/13/2017   Neuropathy Markers No results found for: [id number] Bone Pathology Markers Lab Results  Component Value Date   ALKPHOS 75 03/02/2017   25OHVITD1 13 (L) 09/17/2015   25OHVITD2 <1.0 09/17/2015   25OHVITD3 13 09/17/2015   CALCIUM 9.7 03/02/2017   Coagulation Parameters Lab Results  Component  Value Date   PLT 206 03/02/2017  Cardiovascular Markers Lab Results  Component Value Date   BNP 161.0 (H) 02/13/2017   HGB 15.7 03/02/2017   HCT 48.8 (H) 03/02/2017   Note: Lab results reviewed.  Recent Diagnostic Imaging Review  Dg C-arm 1-60 Min-no Report  Result Date: 02/22/2017 Fluoroscopy was utilized by the requesting physician.  No radiographic interpretation.   Note: Imaging results reviewed.          Meds    Current Outpatient Prescriptions (Cardiovascular):  .  bisoprolol-hydrochlorothiazide (ZIAC) 10-6.25 MG tablet, TAKE 1 TABLET BY MOUTH DAILY .  furosemide (LASIX) 40 MG tablet, Take 1 tablet (40 mg total) by mouth daily. (Patient taking differently: Take 40 mg by mouth 2 (two) times daily. )  Current Outpatient Prescriptions (Respiratory):  .  albuterol (PROVENTIL HFA;VENTOLIN HFA) 108 (90 Base) MCG/ACT inhaler, Inhale 2 puffs into the lungs every 6 (six) hours as needed for wheezing. Reported on 11/03/2015 .  Fluticasone-Salmeterol (ADVAIR DISKUS) 250-50 MCG/DOSE AEPB, INHALE 1 PUFF INTO LUNGS TWICE A DAY .  ipratropium-albuterol (DUONEB) 0.5-2.5 (3) MG/3ML SOLN, Take 3 mLs by nebulization every 6 (six) hours as needed (shortness of breath). .  roflumilast (DALIRESP) 500 MCG TABS tablet, Take 500 mcg by mouth daily.  Current Outpatient Prescriptions (Analgesics):  Marland Kitchen  Aspirin-Caffeine (BC FAST PAIN RELIEF ARTHRITIS) 1000-65 MG PACK, Take 1 packet by mouth every 6 (six) hours as needed (Pain). Marland Kitchen  HYDROcodone-acetaminophen (NORCO/VICODIN) 5-325 MG tablet, Take 1 tablet by mouth every 8 (eight) hours as needed for severe pain.   Current Outpatient Prescriptions (Other):  Marland Kitchen  ALPRAZolam (XANAX) 0.5 MG tablet, Take 1 tablet (0.5 mg total) by mouth 2 (two) times daily as needed for anxiety. (Patient taking differently: Take 0.25 mg by mouth 2 (two) times daily as needed for anxiety. ) .  Cholecalciferol ([employer] D3) 2000 units CAPS, Take 1 capsule by mouth daily. .  Menthol,  Topical Analgesic, (BENGAY COLD THERAPY) 5 % GEL, Apply 1 application topically daily. .  nicotine polacrilex ([employer] NICOTINE POLACRILEX) 2 MG gum, Take 1 each (2 mg total) by mouth as needed for smoking cessation. .  OXYGEN, Inhale 2.5 L into the lungs continuous. Reported on 11/03/2015 .  potassium chloride SA (K-DUR,KLOR-CON) 20 MEQ tablet, Take 1 tablet (20 mEq total) by mouth daily. (Patient taking differently: Take 20 mEq by mouth 2 (two) times daily. ) .  ranitidine (ZANTAC) 150 MG tablet, Take 150 mg by mouth 2 (two) times daily. .  sertraline (ZOLOFT) 100 MG tablet, TAKE 1.5 TABLET (150 MG TOTAL) BY MOUTH DAILY.  ROS  Constitutional: Denies any fever or chills Gastrointestinal: No reported hemesis, hematochezia, vomiting, or acute GI distress Musculoskeletal: Denies any acute onset joint swelling, redness, loss of ROM, or weakness Neurological: No reported episodes of acute onset apraxia, aphasia, dysarthria, agnosia, amnesia, paralysis, loss of coordination, or loss of consciousness  Allergies  Ms. Sandin is allergic to codeine and meloxicam.  PFSH  Drug: Ms. Sparano  reports that she does not use drugs. Alcohol:  reports that she does not drink alcohol. Tobacco:  reports that she has been smoking Cigarettes.  She has a 40.00 pack-year smoking history. She has never used smokeless tobacco. Medical:  has a past medical history of Anxiety; Carpal tunnel syndrome; CHF (congestive heart failure) ([place]); CHF (congestive heart failure) ([place]); Chronic generalized abdominal pain; Chronic rhinitis; COPD (chronic obstructive pulmonary disease) ([facility]); DDD (degenerative disc disease), cervical; Depression; Edema; Flu; Gastritis; Hematuria; Hypertension; Kidney stones; Low back pain; Lumbar radiculitis; Malodorous urine; Muscle  weakness; Obesity; Renal cyst; Sensory urge incontinence; and Tobacco abuse. Surgical: Ms. Moncada  has a past surgical history that includes Tubal ligation; Abdominal  hysterectomy; Tonsillectomy; and Carpal tunnel release (Left). Family: family history includes Alcohol abuse in her father; Coronary artery disease in her mother; Heart disease in her father and mother; Lung cancer in her sister; Stroke in her mother.  Constitutional Exam  General appearance: Well nourished, well developed, and well hydrated. In no apparent acute distress Vitals:   04/11/17 1026  BP: (!) 94/59  Pulse: 75  Resp: 18  Temp: 98.2 F (36.8 C)  TempSrc: Oral  SpO2: 97%  Weight: 237 lb (107.5 kg)  Height: _0  (1.6 m)   BMI Assessment: Estimated body mass index is 41.98 kg/m as calculated from the following:   Height as of this encounter: _1  (1.6 m).   Weight as of this encounter: 237 lb (107.5 kg).  BMI interpretation table: BMI level Category Range association with higher incidence of chronic pain  <18 kg/m2 Underweight   18.5-24.9 kg/m2 Ideal body weight   25-29.9 kg/m2 Overweight Increased incidence by 20%  30-34.9 kg/m2 Obese (Class I) Increased incidence by 68%  35-39.9 kg/m2 Severe obesity (Class II) Increased incidence by 136%  >40 kg/m2 Extreme obesity (Class III) Increased incidence by 254%   BMI Readings from Last 4 Encounters:  04/11/17 41.98 kg/m  04/04/17 42.03 kg/m  03/02/17 43.65 kg/m  02/28/17 43.58 kg/m   Wt Readings from Last 4 Encounters:  04/11/17 237 lb (107.5 kg)  04/04/17 237 lb 4 oz (107.6 kg)  03/02/17 246 lb 6.4 oz (111.8 kg)  02/28/17 246 lb (111.6 kg)  Psych/Mental status: Alert, oriented x 3 (person, place, & time)       Eyes: PERLA Respiratory: No evidence of acute respiratory distress  Cervical Spine Exam  Inspection: No masses, redness, or swelling Alignment: Symmetrical Functional ROM: Unrestricted ROM      Stability: No instability detected Muscle strength & Tone: Functionally intact Sensory: Unimpaired Palpation: No palpable anomalies              Upper Extremity (UE) Exam    Side: Right upper extremity   Side: Left upper extremity  Inspection: No masses, redness, swelling, or asymmetry. No contractures  Inspection: No masses, redness, swelling, or asymmetry. No contractures  Functional ROM: Unrestricted ROM          Functional ROM: Unrestricted ROM          Muscle strength & Tone: Functionally intact  Muscle strength & Tone: Functionally intact  Sensory: Unimpaired  Sensory: Unimpaired  Palpation: No palpable anomalies              Palpation: No palpable anomalies              Specialized Test(s): Deferred         Specialized Test(s): Deferred          Thoracic Spine Exam  Inspection: No masses, redness, or swelling Alignment: Symmetrical Functional ROM: Unrestricted ROM Stability: No instability detected Sensory: Unimpaired Muscle strength & Tone: No palpable anomalies  Lumbar Spine Exam  Inspection: No masses, redness, or swelling Alignment: Symmetrical Functional ROM: Decreased ROM      Stability: No instability detected Muscle strength & Tone: Functionally intact Sensory: Movement-associated pain Palpation: Complains of area being tender to palpation       Provocative Tests: Lumbar Hyperextension and rotation test: Positive bilaterally for facet joint pain. Patrick's Maneuver: evaluation deferred today  Gait & Posture Assessment  Ambulation: Unassisted Gait: Relatively normal for age and body habitus Posture: WNL   Lower Extremity Exam    Side: Right lower extremity  Side: Left lower extremity  Inspection: No masses, redness, swelling, or asymmetry. No contractures  Inspection: No masses, redness, swelling, or asymmetry. No contractures  Functional ROM: Unrestricted ROM          Functional ROM: Unrestricted ROM          Muscle strength & Tone: Functionally intact  Muscle strength & Tone: Functionally intact  Sensory: Unimpaired  Sensory: Unimpaired  Palpation: No palpable anomalies  Palpation: No palpable anomalies   Assessment  Primary Diagnosis &  Pertinent Problem List: The primary encounter diagnosis was Chronic low back pain (Location of Primary Source of Pain) (Left). Diagnoses of Lumbar facet syndrome (Location of Primary Source of Pain) (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), Chronic hip pain, unspecified laterality, Lumbar facet hypertrophy (multilevel), Chronic lower extremity pain (Location of Secondary source of pain) (Left), Chronic lumbar radicular pain (Location of Secondary source of pain) (L4 & S1 Dermatome) (Left), Chronic pain syndrome (significant psychosocial component), Long term current use of opiate analgesic, Long term prescription opiate use, Opiate use (15 MME/Day), and Vitamin D deficiency were also pertinent to this visit.  Status Diagnosis  Improving Improving Controlled 1. Chronic low back pain (Location of Primary Source of Pain) (Left)   2. Lumbar facet syndrome (Location of Primary Source of Pain) (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)   3. Chronic hip pain, unspecified laterality   4. Lumbar facet hypertrophy (multilevel)   5. Chronic lower extremity pain (Location of Secondary source of pain) (Left)   6. Chronic lumbar radicular pain (Location of Secondary source of pain) (L4 & S1 Dermatome) (Left)   7. Chronic pain syndrome (significant psychosocial component)   8. Long term current use of opiate analgesic   9. Long term prescription opiate use   10. Opiate use (15 MME/Day)   11. Vitamin D deficiency     Problems updated and reviewed during this visit: Problem  Lumbar facet syndrome (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)  Chronic hip pain (Location of Tertiary Source of Pain) (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)  Chronic lumbar radicular pain (L4 & S1 Dermatome) (Left)   Plan of Care  Pharmacotherapy (Medications Ordered): No orders of the defined types were placed in this encounter.  New Prescriptions   No medications on file   Medications administered today: Ms. Riffe had no medications administered during this visit.  Lab-work, procedure(s), and/or  referral(s): Orders Placed This Encounter  Procedures  . Radiofrequency,Lumbar  . C-reactive protein  . Sedimentation rate  . 25-Hydroxyvitamin D Lcms D2+D3  . ToxASSURE Select 13 (MW), Urine    Interventional therapies: Planned, scheduled, and/or pending:   Therapeutic Left lumbar facet RFA.   Considering:   Diagnostic bilateral lumbar facet block  Possible bilateral lumbar facet RFA    Palliative PRN treatment(s):   Palliative left sided lumbar facet block    Provider-requested follow-up: Return for RFA, by MD, in addition, Med-Mgmt, by NP.  Future Appointments Date Time Provider [place]  04/17/2017 3:00 PM Harlin Heys, MD [facility] None  05/01/2017 2:00 PM [name], FNP [facility] None  05/11/2017 11:00 AM Vevelyn Francois, NP [facility] None  05/23/2017 1:20 PM [facility]  05/23/2017 1:40 PM [facility]EXA 1 [facility]  06/28/2017 10:15 AM Milinda Pointer, MD [facility] None  04/10/2018 3:00 PM Harlin Heys, MD [facility] None   Primary Care Physician: Margo Common, PA  Location: [facility] Note by: Richie Bonanno A. Dossie Arbour, M.D, DABA, DABAPM, DABPM, DABIPP, FIPP Date: 04/11/2017; Time: 11:25 AM  Patient Instructions  ____________________________________________________________________________________________  Preparing for Procedure with Sedation Instructions: . Oral Intake: Do not eat or drink anything for at least 8 hours prior to your procedure. . Transportation: Public transportation is not allowed. Bring an adult driver. The driver must be physically present in our waiting room before any procedure can be started. Marland Kitchen Physical Assistance: Bring an adult physically capable of assisting you, in the event you need help. This adult should keep you company at home for at least 6 hours after the procedure. . Blood Pressure Medicine: Take your blood pressure medicine with a sip of water the morning of the  procedure. . Blood thinners:  . Diabetics on insulin: Notify the staff so that you can be scheduled 1st case in the morning. If your diabetes requires high dose insulin, take only  of your normal insulin dose the morning of the procedure and notify the staff that you have done so. . Preventing infections: Shower with an antibacterial soap the morning of your procedure. . Build-up your immune system: Take 1000 mg of Vitamin C with every meal (3 times a day) the day prior to your procedure. Marland Kitchen Antibiotics: Inform the staff if you have a condition or reason that requires you to take antibiotics before dental procedures. . Pregnancy: If you are pregnant, call and cancel the procedure. . Sickness: If you have a cold, fever, or any active infections, call and cancel the procedure. . Arrival: You must be in the facility at least 30 minutes prior to your scheduled procedure. . Children: Do not bring children with you. . Dress appropriately: Bring dark clothing that you would not mind if they get stained. . Valuables: Do not bring any jewelry or valuables. Procedure appointments are reserved for interventional treatments only. Marland Kitchen No Prescription Refills. . No medication changes will be discussed during procedure appointments. . No disability issues will be discussed. ____________________________________________________________________________________________  Radiofrequency Lesioning Radiofrequency lesioning is a procedure that is performed to relieve pain. The procedure is often used for back, neck, or arm pain. Radiofrequency lesioning involves the use of a machine that creates radio waves to make heat. During the procedure, the heat is applied to the nerve that carries the pain signal. The heat damages the nerve and interferes with the pain signal. Pain relief usually starts about 2 weeks after the procedure and lasts for 6 months to 1 year. Tell a health care provider about:  Any allergies you  have.  All medicines you are taking, including vitamins, herbs, eye drops, creams, and over-the-counter medicines.  Any problems you or family members have had with anesthetic medicines.  Any blood disorders you have.  Any surgeries you have had.  Any medical conditions you have.  Whether you are pregnant or may be pregnant. What are the risks? Generally, this is a safe procedure. However, problems may occur, including:  Pain or soreness at the injection site.  Infection at the injection site.  Damage to nerves or blood vessels.  What happens before the procedure?  Ask your health care provider about: ? Changing or stopping your regular medicines. This is especially important if you are taking diabetes medicines or blood thinners. ? Taking medicines such as aspirin and ibuprofen. These medicines can thin your blood. Do not take these medicines before your procedure if your health care provider instructs you not to.  Follow  instructions from your health care provider about eating or drinking restrictions.  Plan to have someone take you home after the procedure.  If you go home right after the procedure, plan to have someone with you for 24 hours. What happens during the procedure?  You will be given one or more of the following: ? A medicine to help you relax (sedative). ? A medicine to numb the area (local anesthetic).  You will be awake during the procedure. You will need to be able to talk with the health care provider during the procedure.  With the help of a type of X-ray (fluoroscopy), the health care provider will insert a radiofrequency needle into the area to be treated.  Next, a wire that carries the radio waves (electrode) will be put through the radiofrequency needle. An electrical pulse will be sent through the electrode to verify the correct nerve. You will feel a tingling sensation, and you may have muscle twitching.  Then, the tissue that is around the  needle tip will be heated by an electric current that is passed using the radiofrequency machine. This will numb the nerves.  A bandage (dressing) will be put on the insertion area after the procedure is done. The procedure may vary among health care providers and hospitals. What happens after the procedure?  Your blood pressure, heart rate, breathing rate, and blood oxygen level will be monitored often until the medicines you were given have worn off.  Return to your normal activities as directed by your health care provider. This information is not intended to replace advice given to you by your health care provider. Make sure you discuss any questions you have with your health care provider. Document Released: 06/01/2011 Document Revised: 03/10/2016 Document Reviewed: 11/10/2014 Elsevier Interactive Patient Education  [employer].

## 2017-04-11 ENCOUNTER — Ambulatory Visit: Payer: BLUE CROSS/BLUE SHIELD | Attending: Pain Medicine | Admitting: Pain Medicine

## 2017-04-11 ENCOUNTER — Encounter: Payer: Self-pay | Admitting: Pain Medicine

## 2017-04-11 VITALS — BP 94/59 | HR 75 | Temp 98.2°F | Resp 18 | Ht 63.0 in | Wt 237.0 lb

## 2017-04-11 DIAGNOSIS — M5416 Radiculopathy, lumbar region: Secondary | ICD-10-CM | POA: Diagnosis not present

## 2017-04-11 DIAGNOSIS — M79605 Pain in left leg: Secondary | ICD-10-CM | POA: Diagnosis not present

## 2017-04-11 DIAGNOSIS — Z79891 Long term (current) use of opiate analgesic: Secondary | ICD-10-CM | POA: Diagnosis not present

## 2017-04-11 DIAGNOSIS — I5032 Chronic diastolic (congestive) heart failure: Secondary | ICD-10-CM | POA: Diagnosis not present

## 2017-04-11 DIAGNOSIS — I110 Hypertensive heart disease with heart failure: Secondary | ICD-10-CM | POA: Diagnosis not present

## 2017-04-11 DIAGNOSIS — E559 Vitamin D deficiency, unspecified: Secondary | ICD-10-CM | POA: Diagnosis not present

## 2017-04-11 DIAGNOSIS — Z801 Family history of malignant neoplasm of trachea, bronchus and lung: Secondary | ICD-10-CM | POA: Diagnosis not present

## 2017-04-11 DIAGNOSIS — M545 Low back pain, unspecified: Secondary | ICD-10-CM

## 2017-04-11 DIAGNOSIS — Z8249 Family history of ischemic heart disease and other diseases of the circulatory system: Secondary | ICD-10-CM | POA: Diagnosis not present

## 2017-04-11 DIAGNOSIS — J449 Chronic obstructive pulmonary disease, unspecified: Secondary | ICD-10-CM | POA: Diagnosis not present

## 2017-04-11 DIAGNOSIS — Z6841 Body Mass Index (BMI) 40.0 and over, adult: Secondary | ICD-10-CM | POA: Insufficient documentation

## 2017-04-11 DIAGNOSIS — M47896 Other spondylosis, lumbar region: Secondary | ICD-10-CM | POA: Diagnosis not present

## 2017-04-11 DIAGNOSIS — M47816 Spondylosis without myelopathy or radiculopathy, lumbar region: Secondary | ICD-10-CM

## 2017-04-11 DIAGNOSIS — Z811 Family history of alcohol abuse and dependence: Secondary | ICD-10-CM | POA: Insufficient documentation

## 2017-04-11 DIAGNOSIS — M48061 Spinal stenosis, lumbar region without neurogenic claudication: Secondary | ICD-10-CM | POA: Diagnosis not present

## 2017-04-11 DIAGNOSIS — Z888 Allergy status to other drugs, medicaments and biological substances status: Secondary | ICD-10-CM | POA: Diagnosis not present

## 2017-04-11 DIAGNOSIS — Z9981 Dependence on supplemental oxygen: Secondary | ICD-10-CM | POA: Insufficient documentation

## 2017-04-11 DIAGNOSIS — F3289 Other specified depressive episodes: Secondary | ICD-10-CM | POA: Insufficient documentation

## 2017-04-11 DIAGNOSIS — M4696 Unspecified inflammatory spondylopathy, lumbar region: Secondary | ICD-10-CM | POA: Diagnosis not present

## 2017-04-11 DIAGNOSIS — G8929 Other chronic pain: Secondary | ICD-10-CM

## 2017-04-11 DIAGNOSIS — G4733 Obstructive sleep apnea (adult) (pediatric): Secondary | ICD-10-CM | POA: Diagnosis not present

## 2017-04-11 DIAGNOSIS — M5136 Other intervertebral disc degeneration, lumbar region: Secondary | ICD-10-CM | POA: Insufficient documentation

## 2017-04-11 DIAGNOSIS — R079 Chest pain, unspecified: Secondary | ICD-10-CM | POA: Insufficient documentation

## 2017-04-11 DIAGNOSIS — M25559 Pain in unspecified hip: Secondary | ICD-10-CM

## 2017-04-11 DIAGNOSIS — G894 Chronic pain syndrome: Secondary | ICD-10-CM

## 2017-04-11 DIAGNOSIS — J310 Chronic rhinitis: Secondary | ICD-10-CM | POA: Insufficient documentation

## 2017-04-11 DIAGNOSIS — F1190 Opioid use, unspecified, uncomplicated: Secondary | ICD-10-CM

## 2017-04-11 DIAGNOSIS — M5417 Radiculopathy, lumbosacral region: Secondary | ICD-10-CM | POA: Insufficient documentation

## 2017-04-11 DIAGNOSIS — Z823 Family history of stroke: Secondary | ICD-10-CM | POA: Diagnosis not present

## 2017-04-11 DIAGNOSIS — Z79899 Other long term (current) drug therapy: Secondary | ICD-10-CM | POA: Insufficient documentation

## 2017-04-11 DIAGNOSIS — F17210 Nicotine dependence, cigarettes, uncomplicated: Secondary | ICD-10-CM | POA: Insufficient documentation

## 2017-04-11 NOTE — Progress Notes (Signed)
Safety precautions to be maintained throughout the outpatient stay will include: orient to surroundings, keep bed in low position, maintain call bell within reach at all times, provide assistance with transfer out of bed and ambulation.  

## 2017-04-11 NOTE — Patient Instructions (Addendum)
____________________________________________________________________________________________  Preparing for Procedure with Sedation Instructions: . Oral Intake: Do not eat or drink anything for at least 8 hours prior to your procedure. . Transportation: Public transportation is not allowed. Bring an adult driver. The driver must be physically present in our waiting room before any procedure can be started. Marland Kitchen Physical Assistance: Bring an adult physically capable of assisting you, in the event you need help. This adult should keep you company at home for at least 6 hours after the procedure. . Blood Pressure Medicine: Take your blood pressure medicine with a sip of water the morning of the procedure. . Blood thinners:  . Diabetics on insulin: Notify the staff so that you can be scheduled 1st case in the morning. If your diabetes requires high dose insulin, take only  of your normal insulin dose the morning of the procedure and notify the staff that you have done so. . Preventing infections: Shower with an antibacterial soap the morning of your procedure. . Build-up your immune system: Take 1000 mg of Vitamin C with every meal (3 times a day) the day prior to your procedure. Marland Kitchen Antibiotics: Inform the staff if you have a condition or reason that requires you to take antibiotics before dental procedures. . Pregnancy: If you are pregnant, call and cancel the procedure. . Sickness: If you have a cold, fever, or any active infections, call and cancel the procedure. . Arrival: You must be in the facility at least 30 minutes prior to your scheduled procedure. . Children: Do not bring children with you. . Dress appropriately: Bring dark clothing that you would not mind if they get stained. . Valuables: Do not bring any jewelry or valuables. Procedure appointments are reserved for interventional treatments only. Marland Kitchen No Prescription Refills. . No medication changes will be discussed during procedure  appointments. . No disability issues will be discussed. ____________________________________________________________________________________________  Radiofrequency Lesioning Radiofrequency lesioning is a procedure that is performed to relieve pain. The procedure is often used for back, neck, or arm pain. Radiofrequency lesioning involves the use of a machine that creates radio waves to make heat. During the procedure, the heat is applied to the nerve that carries the pain signal. The heat damages the nerve and interferes with the pain signal. Pain relief usually starts about 2 weeks after the procedure and lasts for 6 months to 1 year. Tell a health care provider about:  Any allergies you have.  All medicines you are taking, including vitamins, herbs, eye drops, creams, and over-the-counter medicines.  Any problems you or family members have had with anesthetic medicines.  Any blood disorders you have.  Any surgeries you have had.  Any medical conditions you have.  Whether you are pregnant or may be pregnant. What are the risks? Generally, this is a safe procedure. However, problems may occur, including:  Pain or soreness at the injection site.  Infection at the injection site.  Damage to nerves or blood vessels.  What happens before the procedure?  Ask your health care provider about: ? Changing or stopping your regular medicines. This is especially important if you are taking diabetes medicines or blood thinners. ? Taking medicines such as aspirin and ibuprofen. These medicines can thin your blood. Do not take these medicines before your procedure if your health care provider instructs you not to.  Follow instructions from your health care provider about eating or drinking restrictions.  Plan to have someone take you home after the procedure.  If you go home  right after the procedure, plan to have someone with you for 24 hours. What happens during the procedure?  You  will be given one or more of the following: ? A medicine to help you relax (sedative). ? A medicine to numb the area (local anesthetic).  You will be awake during the procedure. You will need to be able to talk with the health care provider during the procedure.  With the help of a type of X-ray (fluoroscopy), the health care provider will insert a radiofrequency needle into the area to be treated.  Next, a wire that carries the radio waves (electrode) will be put through the radiofrequency needle. An electrical pulse will be sent through the electrode to verify the correct nerve. You will feel a tingling sensation, and you may have muscle twitching.  Then, the tissue that is around the needle tip will be heated by an electric current that is passed using the radiofrequency machine. This will numb the nerves.  A bandage (dressing) will be put on the insertion area after the procedure is done. The procedure may vary among health care providers and hospitals. What happens after the procedure?  Your blood pressure, heart rate, breathing rate, and blood oxygen level will be monitored often until the medicines you were given have worn off.  Return to your normal activities as directed by your health care provider. This information is not intended to replace advice given to you by your health care provider. Make sure you discuss any questions you have with your health care provider. Document Released: 06/01/2011 Document Revised: 03/10/2016 Document Reviewed: 11/10/2014 Elsevier Interactive Patient Education  [employer].

## 2017-04-15 LAB — TOXASSURE SELECT 13 (MW), URINE

## 2017-04-17 ENCOUNTER — Encounter: Payer: Self-pay | Admitting: Obstetrics and Gynecology

## 2017-04-17 ENCOUNTER — Ambulatory Visit (INDEPENDENT_AMBULATORY_CARE_PROVIDER_SITE_OTHER): Payer: Medicare PPO | Admitting: Obstetrics and Gynecology

## 2017-04-17 VITALS — BP 111/67 | HR 82 | Ht 63.0 in | Wt 240.2 lb

## 2017-04-17 DIAGNOSIS — B977 Papillomavirus as the cause of diseases classified elsewhere: Secondary | ICD-10-CM | POA: Diagnosis not present

## 2017-04-17 DIAGNOSIS — N72 Inflammatory disease of cervix uteri: Secondary | ICD-10-CM

## 2017-04-17 NOTE — Progress Notes (Signed)
HPI:      Theresa [name] is a 61 y.o. [phone] who LMP was No LMP recorded. Patient is postmenopausal.  Subjective:   She presents today For follow-up of her Pap smear. Her cytology was normal but she had positive for high risk viral types. She is a known heavy smoker and has repeatedly been advised to quit in the past for multiple reasons including emphysema. Of significant note she has lost 30 pounds in the last 2 months of intended weight loss.    Hx: The following portions of the patient's history were reviewed and updated as appropriate:             She  has a past medical history of Anxiety; Carpal tunnel syndrome; CHF (congestive heart failure) ([place]); CHF (congestive heart failure) ([place]); Chronic generalized abdominal pain; Chronic rhinitis; COPD (chronic obstructive pulmonary disease) (H[facility]); DDD (degenerative disc disease), cervical; Depression; Edema; Flu; Gastritis; Hematuria; Hypertension; Kidney stones; Low back pain; Lumbar radiculitis; Malodorous urine; Muscle weakness; Obesity; Renal cyst; Sensory urge incontinence; and Tobacco abuse. She  does not have any pertinent problems on file. She  has a past surgical history that includes Tubal ligation; Abdominal hysterectomy; Tonsillectomy; and Carpal tunnel release (Left). Her family history includes Alcohol abuse in her father; Coronary artery disease in her mother; Heart disease in her father and mother; Lung cancer in her sister; Stroke in her mother. She  reports that she has been smoking Cigarettes.  She has a 40.00 pack-year smoking history. She has never used smokeless tobacco. She reports that she does not drink alcohol or use drugs. She is allergic to codeine and meloxicam.       Review of Systems:  Review of Systems  Constitutional: Denied constitutional symptoms, night sweats, recent illness, fatigue, fever, insomnia and weight loss.  Eyes: Denied eye symptoms, eye pain, photophobia, vision change and visual disturbance.   Ears/Nose/Throat/Neck: Denied ear, nose, throat or neck symptoms, hearing loss, nasal discharge, sinus congestion and sore throat.  Cardiovascular: Denied cardiovascular symptoms, arrhythmia, chest pain/pressure, edema, exercise intolerance, orthopnea and palpitations.  Respiratory: Denied pulmonary symptoms, asthma, pleuritic pain, productive sputum, cough, dyspnea and wheezing.  Gastrointestinal: Denied, gastro-esophageal reflux, melena, nausea and vomiting.  Genitourinary: Denied genitourinary symptoms including symptomatic vaginal discharge, pelvic relaxation issues, and urinary complaints.  Musculoskeletal: Denied musculoskeletal symptoms, stiffness, swelling, muscle weakness and myalgia.  Dermatologic: Denied dermatology symptoms, rash and scar.  Neurologic: Denied neurology symptoms, dizziness, headache, neck pain and syncope.  Psychiatric: Denied psychiatric symptoms, anxiety and depression.  Endocrine: Denied endocrine symptoms including hot flashes and night sweats.   Meds:   Current Outpatient Prescriptions on File Prior to Visit  Medication Sig Dispense Refill  . albuterol (PROVENTIL HFA;VENTOLIN HFA) 108 (90 Base) MCG/ACT inhaler Inhale 2 puffs into the lungs every 6 (six) hours as needed for wheezing. Reported on 11/03/2015 1 Inhaler 2  . ALPRAZolam (XANAX) 0.5 MG tablet Take 1 tablet (0.5 mg total) by mouth 2 (two) times daily as needed for anxiety. (Patient taking differently: Take 0.25 mg by mouth 2 (two) times daily as needed for anxiety. ) 60 tablet 0  . Aspirin-Caffeine (BC FAST PAIN RELIEF ARTHRITIS) 1000-65 MG PACK Take 1 packet by mouth every 6 (six) hours as needed (Pain).    . bisoprolol-hydrochlorothiazide (ZIAC) 10-6.25 MG tablet TAKE 1 TABLET BY MOUTH DAILY 90 tablet 1  . Cholecalciferol ([employer] D3) 2000 units CAPS Take 1 capsule by mouth daily.    . Fluticasone-Salmeterol (ADVAIR DISKUS) 250-50 MCG/DOSE AEPB  INHALE 1 PUFF INTO LUNGS TWICE A DAY    . furosemide (LASIX)  40 MG tablet Take 1 tablet (40 mg total) by mouth daily. (Patient taking differently: Take 40 mg by mouth 2 (two) times daily. ) 90 tablet 4  . ipratropium-albuterol (DUONEB) 0.5-2.5 (3) MG/3ML SOLN Take 3 mLs by nebulization every 6 (six) hours as needed (shortness of breath). 360 mL 3  . Menthol, Topical Analgesic, (BENGAY COLD THERAPY) 5 % GEL Apply 1 application topically daily.    . nicotine polacrilex ([employer] NICOTINE POLACRILEX) 2 MG gum Take 1 each (2 mg total) by mouth as needed for smoking cessation. 100 tablet 0  . OXYGEN Inhale 2.5 L into the lungs continuous. Reported on 11/03/2015    . potassium chloride SA (K-DUR,KLOR-CON) 20 MEQ tablet Take 1 tablet (20 mEq total) by mouth daily. (Patient taking differently: Take 20 mEq by mouth 2 (two) times daily. ) 90 tablet 4  . ranitidine (ZANTAC) 150 MG tablet Take 150 mg by mouth 2 (two) times daily.    . roflumilast (DALIRESP) 500 MCG TABS tablet Take 500 mcg by mouth daily.    . sertraline (ZOLOFT) 100 MG tablet TAKE 1.5 TABLET (150 MG TOTAL) BY MOUTH DAILY. 135 tablet 4  . HYDROcodone-acetaminophen (NORCO/VICODIN) 5-325 MG tablet Take 1 tablet by mouth every 8 (eight) hours as needed for severe pain. 90 tablet 0   No current facility-administered medications on file prior to visit.     Objective:     Vitals:   04/17/17 1521  BP: 111/67  Pulse: 82              Pap smear reviewed directly with the patient HPV and cytology discussed in detail.  Assessment:    [id number] Patient Active Problem List   Diagnosis Date Noted  . Grief at loss of child 02/14/2017  . Lumbar facet hypertrophy (multilevel) 11/17/2016  . Lumbar spinal stenosis (L4-5) 11/17/2016  . 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) 11/17/2016  . 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 Lumbosacral radiculopathy at L4 11/17/2016  . Supplemental oxygen dependent 08/01/2016  . Sleep apnea 06/02/2016  . Long term current use of opiate  analgesic 01/12/2016  . Long term prescription opiate use 01/12/2016  . Opiate use (15 MME/Day) 01/12/2016  . Lumbar facet syndrome (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) 01/12/2016  . Chronic sacroiliac joint pain (Bilateral) 01/12/2016  . Vitamin D deficiency 12/30/2015  . Chronic hip pain (Location of Tertiary Source of Pain) (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) 12/28/2015  . 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 Obesity, Class III, BMI 40-49.9 (morbid obesity) ([place]) 12/28/2015  . 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[place]) 11/19/2015  . Coagulation disorder ([place]) 10/22/2015  . Chronic pain syndrome (significant psychosocial component) 09/21/2015  . 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 09/21/2015  . Encounter for therapeutic drug level monitoring 09/16/2015  . Encounter for chronic pain management 09/16/2015  . Pain management 09/16/2015  . Neurogenic pain 09/16/2015  . Neuropathic pain 09/16/2015  . Musculoskeletal pain 09/16/2015  . Lumbar spondylosis (L4-5) 09/16/2015  . Chronic low back pain (Location of Primary Source of Pain) (Left) 09/16/2015  . Chronic lower extremity pain (Location of Secondary source of pain) (Left) 09/16/2015  . Chronic lumbar radicular pain (L4 & S1 Dermatome) (Left) 09/16/2015  . Chronic shoulder pain (Right side) 09/16/2015  . Chronic upper extremity pain (Right-sided) 09/16/2015  . Cervical radiculitis (Right side) 09/16/2015  . Abnormal x-ray of lumbar spine 09/16/2015  .  Chronic diastolic heart failure ([place]) 07/15/2015  . Chest pain 07/15/2015  . Tobacco abuse 07/15/2015  . Blood clotting disorder ([place]) 04/21/2015  . Decreased motor strength 07/16/2014  . Clinical depression 07/16/2014  . Chronic rhinitis 07/16/2014  . Carpal tunnel syndrome 07/16/2014  . Anxiety state 07/16/2014  . Neuritis or radiculitis due to rupture of lumbar intervertebral disc 07/02/2014  . DDD (degenerative disc disease), lumbar 07/02/2014  . CAFL (chronic  airflow limitation) ([place]) 02/27/2014  . Chronic obstructive pulmonary disease ([place]) 02/27/2014  . Essential (primary) hypertension 03/27/2008     1. High risk human papilloma virus (HPV) infection of cervix        Plan:            1.  We discussed cytology and HPV in detail. Risk of cervical cancer and precancer discussed in detail. The necessity of Pap smear follow-up discussed. Cessation of smoking advised.  2.  Plan follow-up Pap in 1 year with both cytology and HPV. Orders No orders of the defined types were placed in this encounter.   No orders of the defined types were placed in this encounter.       F/U  Return in about 1 year (around 04/17/2018) for Annual Physical. I spent 17 minutes with this patient of which greater than 50% was spent discussing abnormal Pap smears, follow-up, HPV, smoking.  Finis Bud, M.D. 04/17/2017 4:56 PM

## 2017-05-01 ENCOUNTER — Ambulatory Visit: Payer: Self-pay | Admitting: Family

## 2017-05-02 NOTE — Progress Notes (Signed)
UDS-LOW CREATININE Warning: Low urine creatinine.  (< 20 mg/dL) Less than 20 is considered to be a dilute urine specimen: Most likely due to increased water or liquid intake. This could be a result of short-term water loading (flushing) in an attempt to dilute any drug below testing cut-off concentrations.  (< 2 mg/dL) Less than 2 is considered an abnormally diluted sample: Specimen showing an excessively low creatinine value. May be an indication that the specimen is not consistent with normal human urine.

## 2017-05-04 ENCOUNTER — Ambulatory Visit: Payer: BLUE CROSS/BLUE SHIELD | Attending: Family | Admitting: Family

## 2017-05-04 ENCOUNTER — Encounter: Payer: Self-pay | Admitting: Family

## 2017-05-04 VITALS — BP 119/69 | HR 90 | Resp 20 | Ht 63.0 in | Wt 245.4 lb

## 2017-05-04 DIAGNOSIS — Z720 Tobacco use: Secondary | ICD-10-CM

## 2017-05-04 DIAGNOSIS — M545 Low back pain: Secondary | ICD-10-CM | POA: Diagnosis not present

## 2017-05-04 DIAGNOSIS — Z79899 Other long term (current) drug therapy: Secondary | ICD-10-CM | POA: Insufficient documentation

## 2017-05-04 DIAGNOSIS — F17210 Nicotine dependence, cigarettes, uncomplicated: Secondary | ICD-10-CM | POA: Diagnosis not present

## 2017-05-04 DIAGNOSIS — G8929 Other chronic pain: Secondary | ICD-10-CM | POA: Insufficient documentation

## 2017-05-04 DIAGNOSIS — J441 Chronic obstructive pulmonary disease with (acute) exacerbation: Secondary | ICD-10-CM | POA: Diagnosis not present

## 2017-05-04 DIAGNOSIS — Z9981 Dependence on supplemental oxygen: Secondary | ICD-10-CM | POA: Diagnosis not present

## 2017-05-04 DIAGNOSIS — Z87442 Personal history of urinary calculi: Secondary | ICD-10-CM | POA: Insufficient documentation

## 2017-05-04 DIAGNOSIS — F419 Anxiety disorder, unspecified: Secondary | ICD-10-CM | POA: Insufficient documentation

## 2017-05-04 DIAGNOSIS — M5030 Other cervical disc degeneration, unspecified cervical region: Secondary | ICD-10-CM | POA: Insufficient documentation

## 2017-05-04 DIAGNOSIS — I5032 Chronic diastolic (congestive) heart failure: Secondary | ICD-10-CM | POA: Diagnosis not present

## 2017-05-04 DIAGNOSIS — Z8249 Family history of ischemic heart disease and other diseases of the circulatory system: Secondary | ICD-10-CM | POA: Diagnosis not present

## 2017-05-04 DIAGNOSIS — Z9851 Tubal ligation status: Secondary | ICD-10-CM | POA: Diagnosis not present

## 2017-05-04 DIAGNOSIS — Z823 Family history of stroke: Secondary | ICD-10-CM | POA: Insufficient documentation

## 2017-05-04 DIAGNOSIS — Z7982 Long term (current) use of aspirin: Secondary | ICD-10-CM | POA: Insufficient documentation

## 2017-05-04 DIAGNOSIS — F329 Major depressive disorder, single episode, unspecified: Secondary | ICD-10-CM | POA: Insufficient documentation

## 2017-05-04 DIAGNOSIS — Z9889 Other specified postprocedural states: Secondary | ICD-10-CM | POA: Diagnosis not present

## 2017-05-04 DIAGNOSIS — Z90710 Acquired absence of both cervix and uterus: Secondary | ICD-10-CM | POA: Insufficient documentation

## 2017-05-04 DIAGNOSIS — I110 Hypertensive heart disease with heart failure: Secondary | ICD-10-CM | POA: Diagnosis not present

## 2017-05-04 DIAGNOSIS — Z801 Family history of malignant neoplasm of trachea, bronchus and lung: Secondary | ICD-10-CM | POA: Insufficient documentation

## 2017-05-04 MED ORDER — PREDNISONE 10 MG (21) PO TBPK: ORAL_TABLET | ORAL | 0 refills | Status: DC

## 2017-05-04 NOTE — Progress Notes (Signed)
Patient ID: Theresa [name], female    DOB: 24-May-1956, 61 y.o.   MRN: [id number]  HPI Theresa [name] is a 61 y/o female with a history of chronic low back pain, HTN, depression, COPD on long-term oxygen, anxiety, carpal tunnel syndrome, long-standing tobacco use and chronic heart failure.  Last echo was done 11/03/15 but unable to access results. Echo done on 02/19/15 showed an EF of 50% without valvular stenosis. Recent PFT's done on 01/18/16  Admitted 02/13/17 due to COPD exacerbation. Initially needed bipap and transitioned back to 2L of oxygen. Given prednisone and antibiotics for 5 days at discharge. Psychiatry consult done due to daughter's death. Recently admitted on October 27, 2016 due to COPD exacerbation. Initially treated with bipap and then baseline oxygen was resumed. Given IV steroids and discharged on oral prednisone. Was in the ED on 03/07/16 due to shoulder pain after a mechanical fall. Was treated as whiplash and was given mobic. Last admission was 11/02/15 due to COPD exacerbation.  She presents today for a follow-up visit with a chief complaint of wheezing. She says that she's had wheezing for many years but it's been worse lately. She has associated fatigue, pedal edema and depression along with this. Continues to wear her bipap and oxygen but admits that she's having trouble sleeping due to the depression.   Past Medical History:  Diagnosis Date  . Anxiety   . Carpal tunnel syndrome   . CHF (congestive heart failure) ([place])   . CHF (congestive heart failure) ([place])    1/18  . Chronic generalized abdominal pain   . Chronic rhinitis   . COPD (chronic obstructive pulmonary disease) ([place])   . DDD (degenerative disc disease), cervical   . Depression   . Edema   . Flu    1/18  . Gastritis   . Hematuria   . Hypertension   . Kidney stones   . Low back pain   . Lumbar radiculitis   . Malodorous urine   . Muscle weakness   . Obesity   . Renal cyst   . Sensory urge incontinence   . Tobacco  abuse    Past Surgical History:  Procedure Laterality Date  . ABDOMINAL HYSTERECTOMY    . CARPAL TUNNEL RELEASE Left   . TONSILLECTOMY    . TUBAL LIGATION     Family History  Problem Relation Age of Onset  . Heart disease Mother   . Stroke Mother   . Coronary artery disease Mother   . Lung cancer Sister   . Alcohol abuse Father   . Heart disease Father   . Prostate cancer Neg Hx   . Kidney cancer Neg Hx   . Bladder Cancer Neg Hx    Social History  Substance Use Topics  . Smoking status: Current Every Day Smoker    Packs/day: 1.00    Years: 40.00    Types: Cigarettes  . Smokeless tobacco: Never Used     Comment: first day of patch patient states she's trying  . Alcohol use No   Allergies  Allergen Reactions  . Codeine Nausea And Vomiting  . Meloxicam Other (See Comments)    Stomach pain   Prior to Admission medications   Medication Sig Start Date End Date Taking? Authorizing Provider  albuterol (PROVENTIL HFA;VENTOLIN HFA) 108 (90 Base) MCG/ACT inhaler Inhale 2 puffs into the lungs every 6 (six) hours as needed for wheezing. Reported on 11/03/2015 11/02/16  Yes Demetrios Loll, MD  ALPRAZolam Duanne Moron) 0.5 MG tablet Take  1 tablet (0.5 mg total) by mouth 2 (two) times daily as needed for anxiety. Patient taking differently: Take 0.25 mg by mouth 2 (two) times daily as needed for anxiety.  12/09/16  Yes Hackney, Tina A, FNP  Aspirin-Caffeine (BC FAST PAIN RELIEF ARTHRITIS) 1000-65 MG PACK Take 1 packet by mouth every 6 (six) hours as needed (Pain).   Yes [provider]  bisoprolol-hydrochlorothiazide [facility]) 10-6.25 MG tablet TAKE 1 TABLET BY MOUTH DAILY 12/16/16  Yes Chrismon, Vickki Muff, PA  Cholecalciferol ([facility] D3) 2000 units CAPS Take 1 capsule by mouth daily.   Yes [provider]  Fluticasone-Salmeterol (ADVAIR DISKUS) 250-50 MCG/DOSE AEPB INHALE 1 PUFF INTO LUNGS TWICE A DAY 05/23/16  Yes [provider]  furosemide (LASIX) 40 MG tablet Take 1 tablet (40  mg total) by mouth daily. Patient taking differently: Take 40 mg by mouth 2 (two) times daily.  08/29/16  Yes Hackney, Otila Kluver A, FNP  HYDROcodone-acetaminophen (NORCO/VICODIN) 5-325 MG tablet Take 1 tablet by mouth every 8 (eight) hours as needed for severe pain. 02/16/17 05/04/17 Yes Milinda Pointer, MD  ipratropium-albuterol (DUONEB) 0.5-2.5 (3) MG/3ML SOLN Take 3 mLs by nebulization every 6 (six) hours as needed (shortness of breath). 11/02/16  Yes Demetrios Loll, MD  Menthol, Topical Analgesic, (BENGAY COLD THERAPY) 5 % GEL Apply 1 application topically daily.   Yes [provider]  OXYGEN Inhale 2.5 L into the lungs continuous. Reported on 11/03/2015   Yes [provider]  potassium chloride SA (K-DUR,KLOR-CON) 20 MEQ tablet Take 1 tablet (20 mEq total) by mouth daily. Patient taking differently: Take 20 mEq by mouth 2 (two) times daily.  09/16/16  Yes Hackney, Otila Kluver A, FNP  ranitidine (ZANTAC) 150 MG tablet Take 150 mg by mouth 2 (two) times daily.   Yes [provider]  roflumilast (DALIRESP) 500 MCG TABS tablet Take 500 mcg by mouth daily.   Yes [provider]  sertraline (ZOLOFT) 100 MG tablet TAKE 1.5 TABLET (150 MG TOTAL) BY MOUTH DAILY. 02/23/17  Yes Chrismon, Vickki Muff, PA  nicotine polacrilex ([facility]S NICOTINE POLACRILEX) 2 MG gum Take 1 each (2 mg total) by mouth as needed for smoking cessation. Patient not taking: Reported on 05/04/2017 02/16/17   Bettey Costa, MD    Review of Systems  Constitutional: Positive for fatigue. Negative for appetite change.  HENT: Negative for congestion, postnasal drip and sore throat.   Eyes: Negative.   Respiratory: Positive for wheezing. Negative for cough and shortness of breath.   Cardiovascular: Positive for leg swelling. Negative for chest pain and palpitations.  Gastrointestinal: Negative for abdominal distention, abdominal pain and nausea.  Endocrine: Negative.   Genitourinary: Negative.   Musculoskeletal: Positive for  back pain. Negative for neck pain.  Skin: Negative.   Allergic/Immunologic: Negative.   Neurological: Negative for dizziness and light-headedness.  Hematological: Negative for adenopathy. Does not bruise/bleed easily.  Psychiatric/Behavioral: Positive for agitation, dysphoric mood and sleep disturbance (wearing oxygen & bipap at night). Negative for suicidal ideas. The patient is nervous/anxious (worse over the last 2 weeks).    Vitals:   05/04/17 1418 05/04/17 1453  BP: (!) 123/104 119/69  Pulse: 90   Resp: 20   SpO2: 93%   Weight: 245 lb 6 oz (111.3 kg)   Height: _0  (1.6 m)    Wt Readings from Last 3 Encounters:  05/04/17 245 lb 6 oz (111.3 kg)  04/17/17 240 lb 3 oz (108.9 kg)  04/11/17 237 lb (107.5 kg)  Lab Results  Component Value Date   CREATININE 0.79 03/02/2017   CREATININE 0.73 02/14/2017   CREATININE 0.81 02/13/2017   Physical Exam  Constitutional: She is oriented to person, place, and time. She appears well-developed and well-nourished.  HENT:  Head: Normocephalic and atraumatic.  Neck: Normal range of motion. Neck supple. No JVD present.  Cardiovascular: Normal rate and regular rhythm.   Pulmonary/Chest: Effort normal. She has wheezes in the right upper field, the right lower field, the left upper field and the left lower field. She has no rales.  Abdominal: Soft. She exhibits no distension. There is no tenderness.  Musculoskeletal: She exhibits edema (1+ pitting edema in bilateral lower legs). She exhibits no tenderness.  Neurological: She is alert and oriented to person, place, and time.  Skin: Skin is warm and dry.  Psychiatric: Her behavior is normal. Thought content normal. Her mood appears anxious. She exhibits a depressed mood.  tearful  Nursing note and vitals reviewed.     Assessment & Plan:  1: Chronic heart failure with preserved ejection fraction- - NYHA class III - continues to be mildly volume overloaded today - weight stable since last  here  - continues to take 46m furosemide BID along with 246m potassium BID - BMP from 03/02/17 reviewed; potassium 4.6, sodium 141 and GFR 82 - monitor sodium content carefully - weigh daily and call for an overnight weight gain of >2 pounds or a weekly weight gain of >5 pounds - saw cardiologist (CClayborn Bigness5/23/18  - did not fit criteria for REDS vest reading  2: COPD exacerbation- - wearing bipap nightly - oxygen at 2.5L around the clock - last saw pulmonology (FRaul Delon 03/15/17 - prednisone DS 6 day pack given today. Reminded to take with food and not right before going to bed  3: Tobacco use- - currently wearing a nicotine patch - alternates between the patch, gum and actual cigarettes; doesn't smoke while wearing the patch - removes herself from the oxygen when smoking - complete cessation discussed for 3 minutes with her  4: Reactive depression- - daughter suddenly died 4/04-19-18nd patient admits that she's depressed  - continues living at her daughter's home but says that as difficult as it is to live there, she feels closer to her by living there - psych saw patient in the hospital but is unable to get established with anyone locally due to insurance - [place] to go [place] - she adamantly denies any suicidal thoughts/plans and gives a verbal contract that if she starts to have these thoughts, that she will call someone and she agrees - emotional support given; explained that grief can come in waves and exacerbations can occur without any reason  Patient did not bring her medications nor a list. Each medication was verbally reviewed with the patient and she was encouraged to bring the bottles to every visit to confirm accuracy of list.  Return here in 1 monthk or sooner for any questions/problems before then

## 2017-05-04 NOTE — Patient Instructions (Addendum)
Continue weighing daily and call for an overnight weight gain of > 2 pounds or a weekly weight gain of >5 pounds.   [facility]  [address]  [place] [address] [facility]: [phone]  Evaluations WALK IN ONLY  Monday, Wednesday, Friday    8:30 am -3 pm    Smoking Cessation Quitting smoking is important to your health and has many advantages. However, it is not always easy to quit since nicotine is a very addictive drug. Oftentimes, people try 3 times or more before being able to quit. This document explains the best ways for you to prepare to quit smoking. Quitting takes hard work and a lot of effort, but you can do it. ADVANTAGES OF QUITTING SMOKING  You will live longer, feel better, and live better.  Your body will feel the impact of quitting smoking almost immediately.  Within 20 minutes, blood pressure decreases. Your pulse returns to its normal level.  After 8 hours, carbon monoxide levels in the blood return to normal. Your oxygen level increases.  After 24 hours, the chance of having a heart attack starts to decrease. Your breath, hair, and body stop smelling like smoke.  After 48 hours, damaged nerve endings begin to recover. Your sense of taste and smell improve.  After 72 hours, the body is virtually free of nicotine. Your bronchial tubes relax and breathing becomes easier.  After 2 to 12 weeks, lungs can hold more air. Exercise becomes easier and circulation improves.  The risk of having a heart attack, stroke, cancer, or lung disease is greatly reduced.  After 1 year, the risk of coronary heart disease is cut in half.  After 5 years, the risk of stroke falls to the same as a nonsmoker.  After 10 years, the risk of lung cancer is cut in half and the risk of other cancers decreases significantly.  After 15 years, the risk of coronary heart disease drops, usually to the level of a nonsmoker.  If you are pregnant, quitting smoking will improve your  chances of having a healthy baby.  The people you live with, especially any children, will be healthier.  You will have extra money to spend on things other than cigarettes. QUESTIONS TO THINK ABOUT BEFORE ATTEMPTING TO QUIT You may want to talk about your answers with your health care provider.  Why do you want to quit?  If you tried to quit in the past, what helped and what did not?  What will be the most difficult situations for you after you quit? How will you plan to handle them?  Who can help you through the tough times? Your family? Friends? A health care provider?  What pleasures do you get from smoking? What ways can you still get pleasure if you quit? Here are some questions to ask your health care provider:  How can you help me to be successful at quitting?  What medicine do you think would be best for me and how should I take it?  What should I do if I need more help?  What is smoking withdrawal like? How can I get information on withdrawal? GET READY  Set a quit date.  Change your environment by getting rid of all cigarettes, ashtrays, matches, and lighters in your home, car, or work. Do not let people smoke in your home.  Review your past attempts to quit. Think about what worked and what did not. GET SUPPORT AND ENCOURAGEMENT You have a better chance  of being successful if you have help. You can get support in many ways.  Tell your family, friends, and coworkers that you are going to quit and need their support. Ask them not to smoke around you.  Get individual, group, or telephone counseling and support. Programs are available at [employer] and health centers. Call your local health department for information about programs in your area.  Spiritual beliefs and practices may help some smokers quit.  Download a "quit meter" on your computer to keep track of quit statistics, such as how long you have gone without smoking, cigarettes not smoked, and money  saved.  Get a self-help book about quitting smoking and staying off tobacco. [place] yourself from urges to smoke. Talk to someone, go for a walk, or occupy your time with a task.  Change your normal routine. Take a different route to work. Drink tea instead of coffee. Eat breakfast in a different place.  Reduce your stress. Take a hot bath, exercise, or read a book.  Plan something enjoyable to do every day. Reward yourself for not smoking.  Explore interactive web-based programs that specialize in helping you quit. GET MEDICINE AND USE IT CORRECTLY Medicines can help you stop smoking and decrease the urge to smoke. Combining medicine with the above behavioral methods and support can greatly increase your chances of successfully quitting smoking.  Nicotine replacement therapy helps deliver nicotine to your body without the negative effects and risks of smoking. Nicotine replacement therapy includes nicotine gum, lozenges, inhalers, nasal sprays, and skin patches. Some may be available over-the-counter and others require a prescription.  Antidepressant medicine helps people abstain from smoking, but how this works is unknown. This medicine is available by prescription.  Nicotinic receptor partial agonist medicine simulates the effect of nicotine in your brain. This medicine is available by prescription. Ask your health care provider for advice about which medicines to use and how to use them based on your health history. Your health care provider will tell you what side effects to look out for if you choose to be on a medicine or therapy. Carefully read the information on the package. Do not use any other product containing nicotine while using a nicotine replacement product.  RELAPSE OR DIFFICULT SITUATIONS Most relapses occur within the first 3 months after quitting. Do not be discouraged if you start smoking again. Remember, most people try several times  before finally quitting. You may have symptoms of withdrawal because your body is used to nicotine. You may crave cigarettes, be irritable, feel very hungry, cough often, get headaches, or have difficulty concentrating. The withdrawal symptoms are only temporary. They are strongest when you first quit, but they will go away within 10-14 days. To reduce the chances of relapse, try to:  Avoid drinking alcohol. Drinking lowers your chances of successfully quitting.  Reduce the amount of caffeine you consume. Once you quit smoking, the amount of caffeine in your body increases and can give you symptoms, such as a rapid heartbeat, sweating, and anxiety.  Avoid smokers because they can make you want to smoke.  Do not let weight gain distract you. Many smokers will gain weight when they quit, usually less than 10 pounds. Eat a healthy diet and stay active. You can always lose the weight gained after you quit.  Find ways to improve your mood other than smoking. FOR MORE INFORMATION  www.smokefree.gov  Document Released: 09/27/2001 Document Revised: 02/17/2014 Document Reviewed: 01/12/2012  [employer] Patient Information 2015 [place]. This information is not intended to replace advice given to you by your health care provider. Make sure you discuss any questions you have with your health care provider.

## 2017-05-08 IMAGING — CT CT ANGIO CHEST
2 of 6 series · 17 of 46 positions shown · IV contrast (APPLIED)
Comparison: Chest radiograph [DATE], 0427 0427 at 4705 hours

CLINICAL DATA: Shortness of breath for 1 week, nonproductive cough.
Symptoms worsening tonight. History of COPD, CHF.

EXAM:
CT ANGIOGRAPHY CHEST WITH CONTRAST
TECHNIQUE: Multidetector CT imaging of the chest was performed using the
standard protocol during bolus administration of intravenous
contrast. Multiplanar CT image reconstructions and MIPs were
obtained to evaluate the vascular anatomy.
CONTRAST:  75 cc Isovue 370

[Series 7: thins · axial · 0.73mm/px · z∈[+51,+305]mm · 15 of 280 slices shown]
[im 13/280  lung]
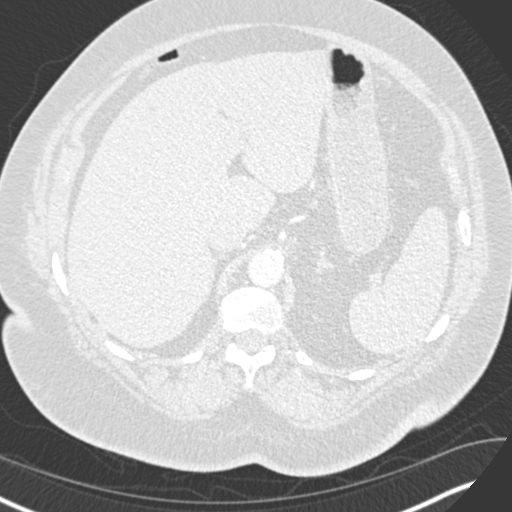
[im 37/280  soft-tissue]
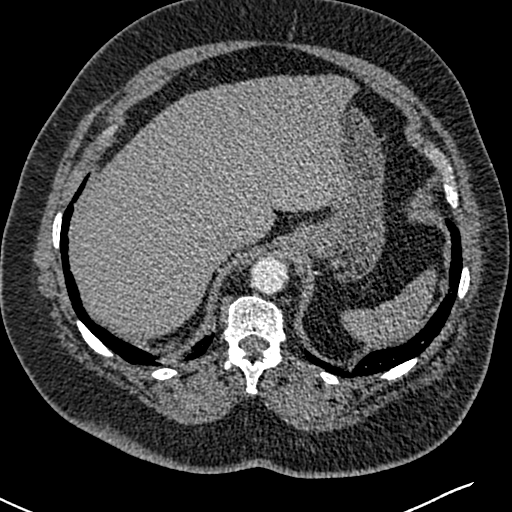
[im 49/280  lung]
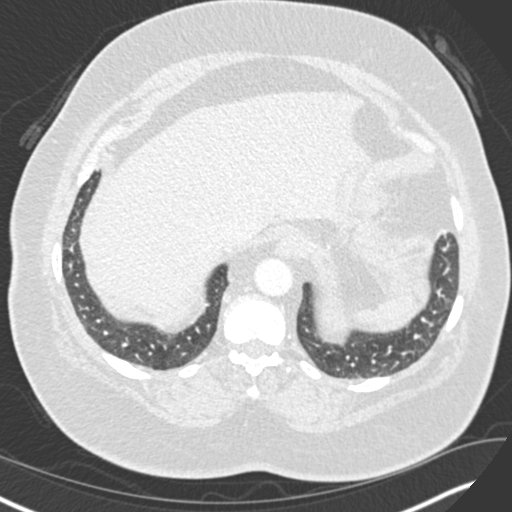
[im 73/280  soft-tissue]
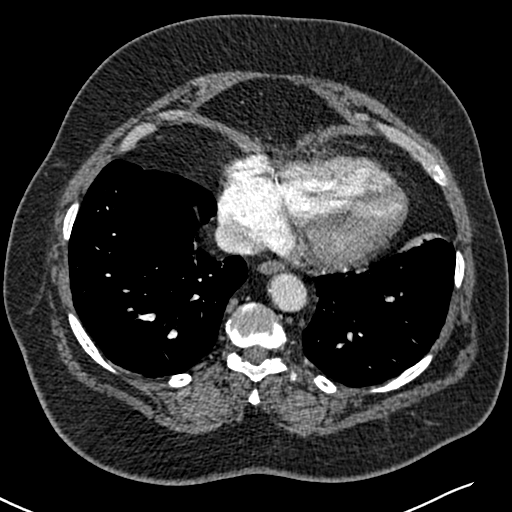
[im 85/280  lung]
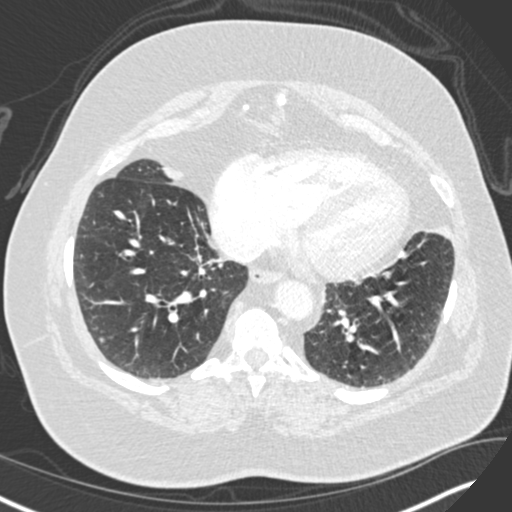
[im 110/280  soft-tissue]
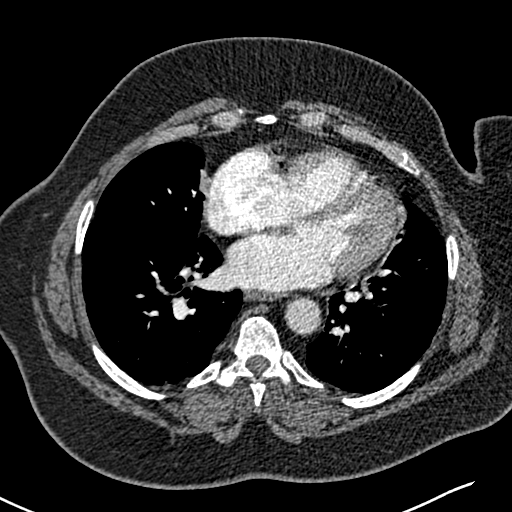
[im 122/280  lung]
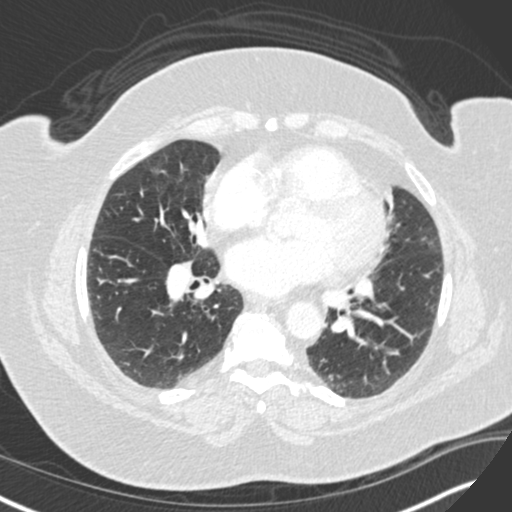
[im 146/280  soft-tissue]
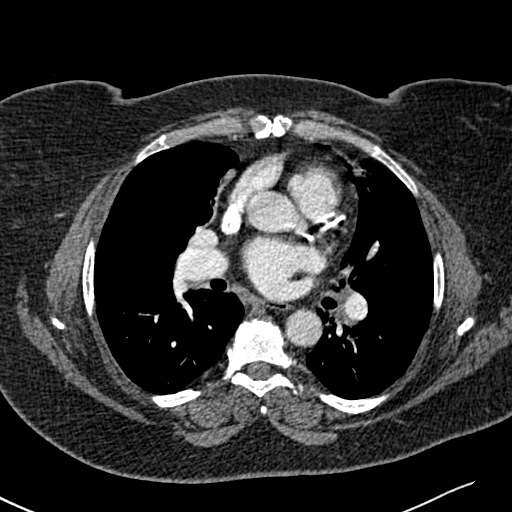
[im 158/280  lung]
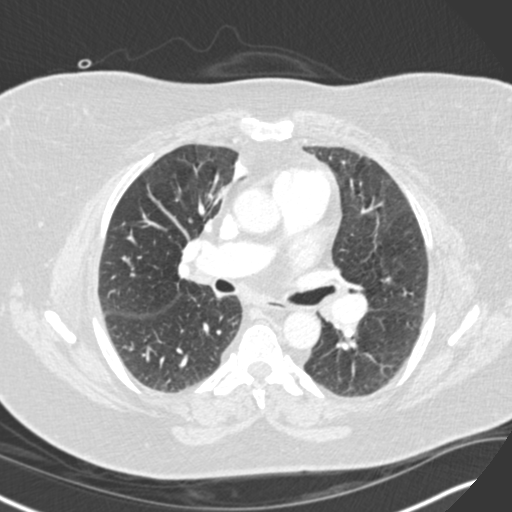
[im 170/280  soft-tissue]
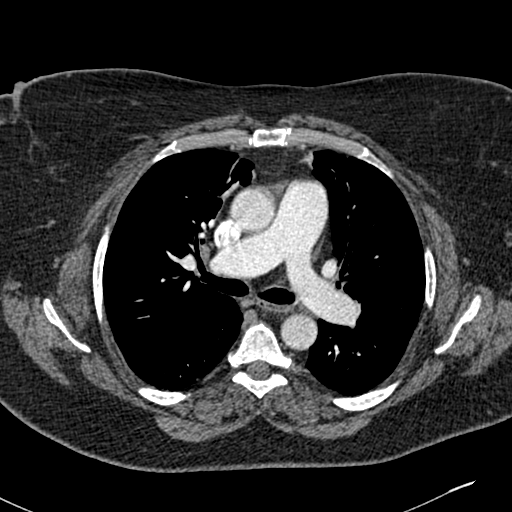
[im 195/280  lung]
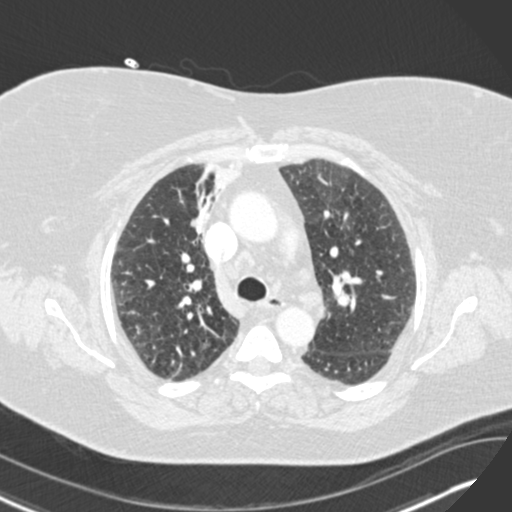
[im 207/280  soft-tissue]
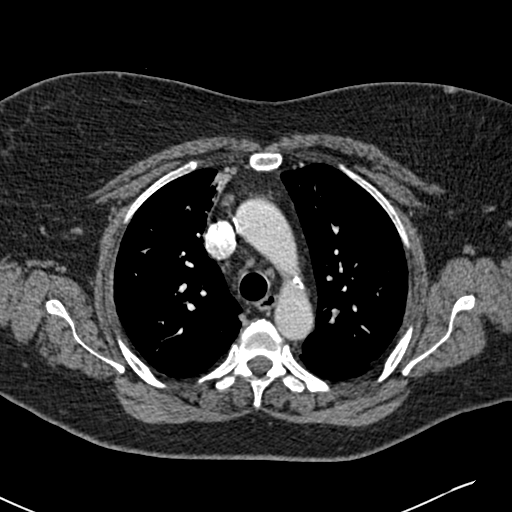
[im 231/280  lung]
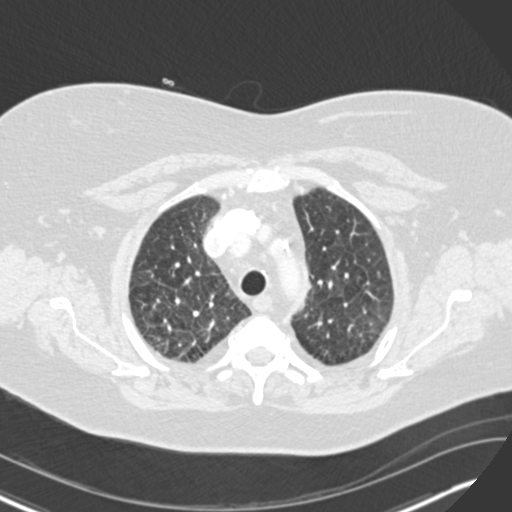
[im 243/280  soft-tissue]
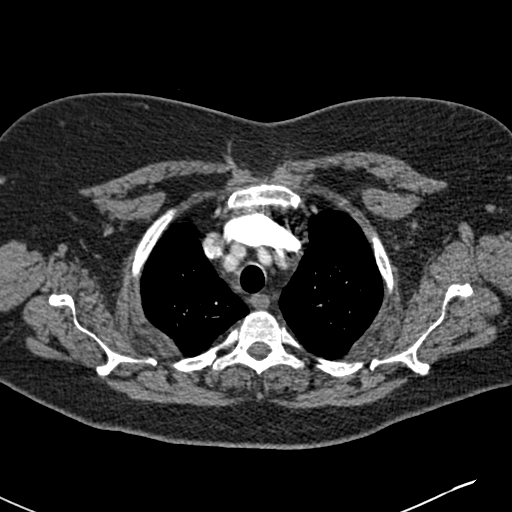
[im 267/280  lung]
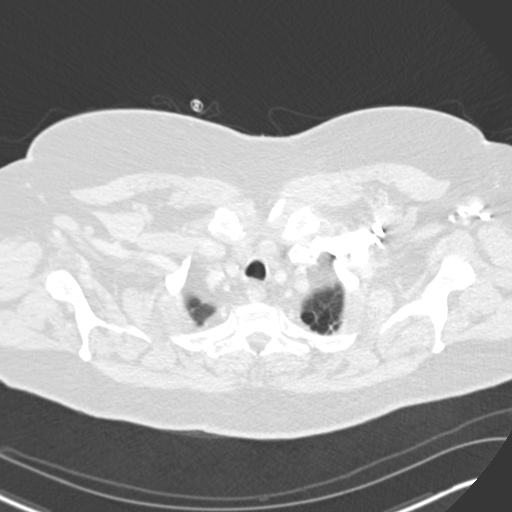

[Series 9: coronal mpr · coronal · 0.55mm/px · 2 of 96 slices shown]
[im 32/96  soft-tissue]
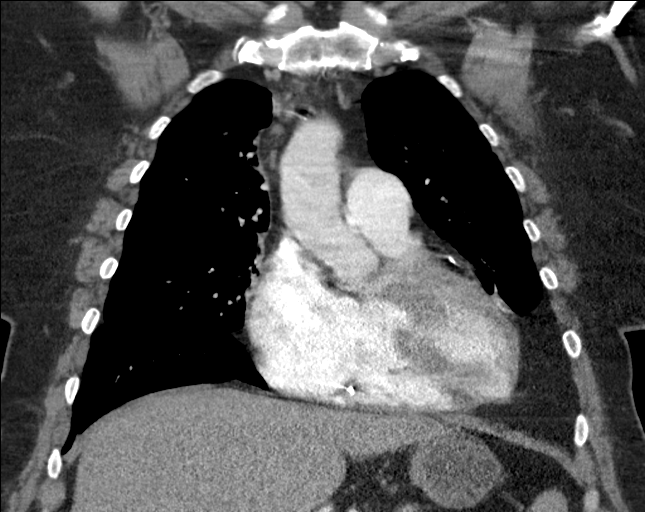
[im 64/96  soft-tissue]
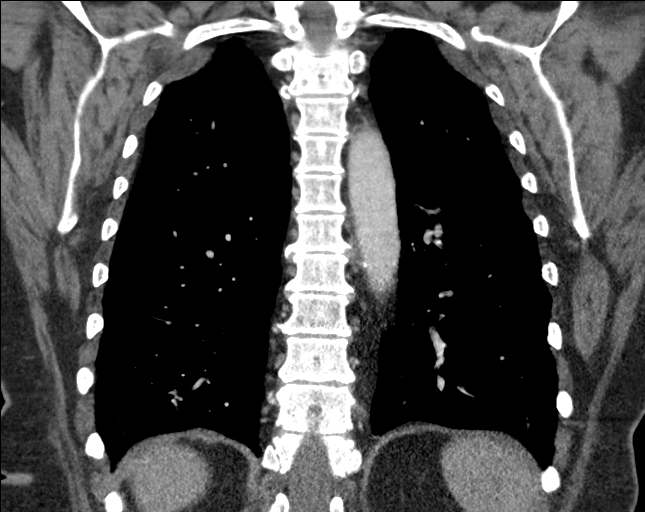

[17 of 46 positions shown; findings below may reference images not displayed]

FINDINGS: CARDIOVASCULAR: Adequate contrast opacification of the pulmonary
artery's. Main pulmonary artery is enlarged at 3.5 cm. No pulmonary
arterial filling defects to the level of the subsegmental branches.
Heart size is mildly enlarged, no right heart strain. Moderate
coronary artery calcifications. No pericardial effusions. Thoracic
aorta is normal course and caliber, mild calcific atherosclerosis of
the aortic arch.

MEDIASTINUM/NODES: No lymphadenopathy by CT size criteria. Greater
than expected number of subcentimeter mediastinal lymph nodes,
likely reactive. A few small hilar lymph nodes.

LUNGS/PLEURA: Tracheobronchial tree is patent, no pneumothorax.
Interlobular septal thickening. No pleural effusion or focal
consolidation. 3 mm RIGHT lower lobe sub solid pulmonary nodule,
series 8, image 66/94 ; no indicated follow-up by size criteria.
Bilateral upper lobe atelectasis.

UPPER ABDOMEN: Included view of the abdomen is nonacute. 14 mm LEFT
adrenal nodule. Numerous subcentimeter hypodense and liver most
compatible with cysts.

MUSCULOSKELETAL: Visualized soft tissues and included osseous
structures are nonacute. Mild degenerative change of the thoracic
spine.

Review of the MIP images confirms the above findings.
IMPRESSION: 1. No acute pulmonary embolism. Enlarged main pulmonary artery
associated with pulmonary arterial hypertension.
2. Mild cardiomegaly. Interlobular septal thickening in mild
bronchial wall thickening most compatible with pulmonary edema,
there could be a chronic component underlying reactive airway
disease/bronchitis. No focal consolidation.
3. Greater than expected number of non pathologically enlarged
mediastinal lymph nodes may be reactive.
4. **An incidental finding of potential clinical significance has
been found. 14 mm LEFT adrenal nodule. Without history of cancer,
this is probably benign. Consider 12 month follow-up adrenal CT.
This recommendation follows ACR consensus guidelines: Management of
Incidental Adrenal Masses: A White Paper of the ACR Incidental
Findings Committee. [HOSPITAL] 5092;14:[id number].**

## 2017-05-08 IMAGING — CR DG CHEST 2V
2 series · 2 of 2 positions shown · non-contrast
Comparison: Chest radiograph performed 11/02/2015

CLINICAL DATA: Acute onset of shortness of breath and nonproductive
cough. Decreased O2 saturation. Initial encounter.

EXAM:
CHEST  2 VIEW

[chest pa]
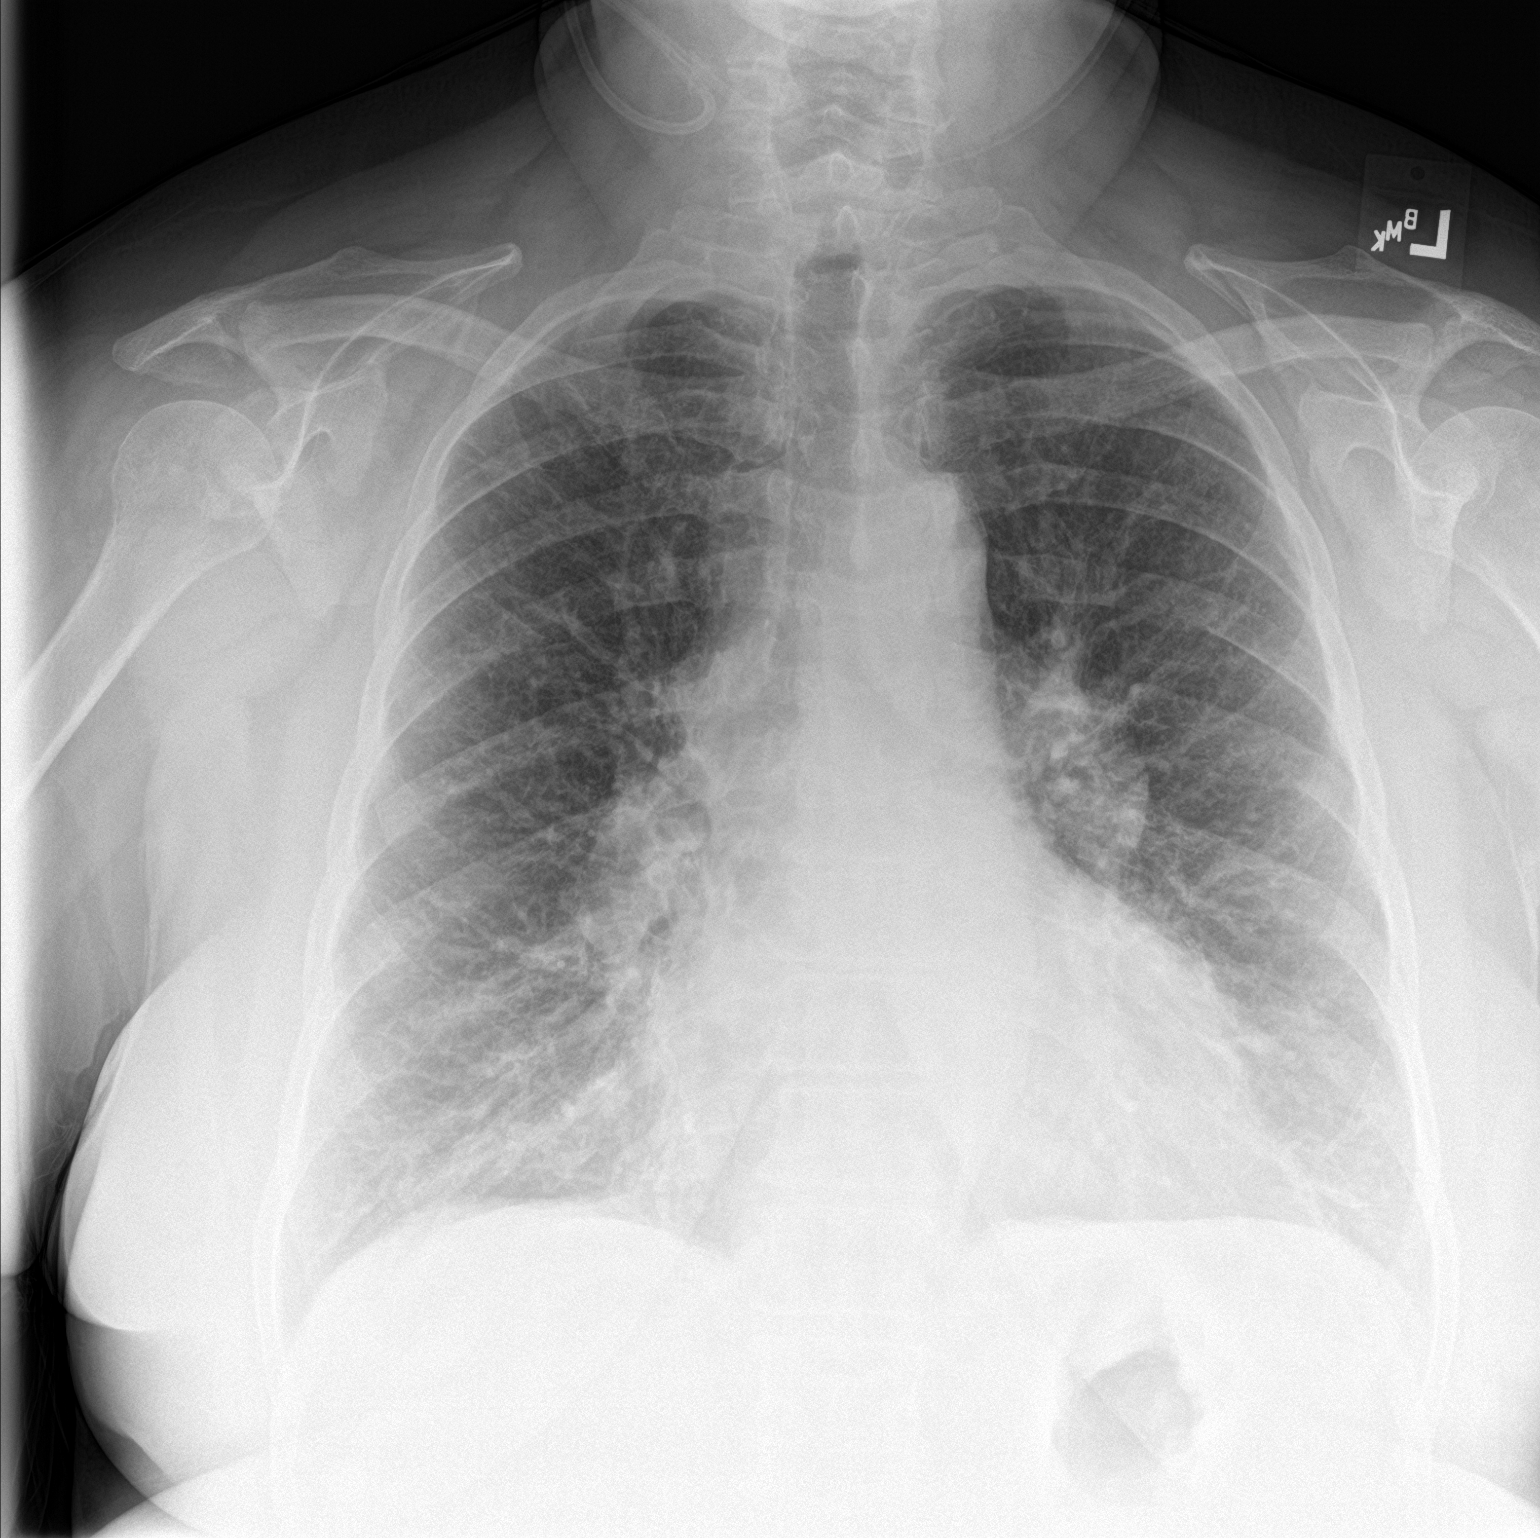

[chest lat]
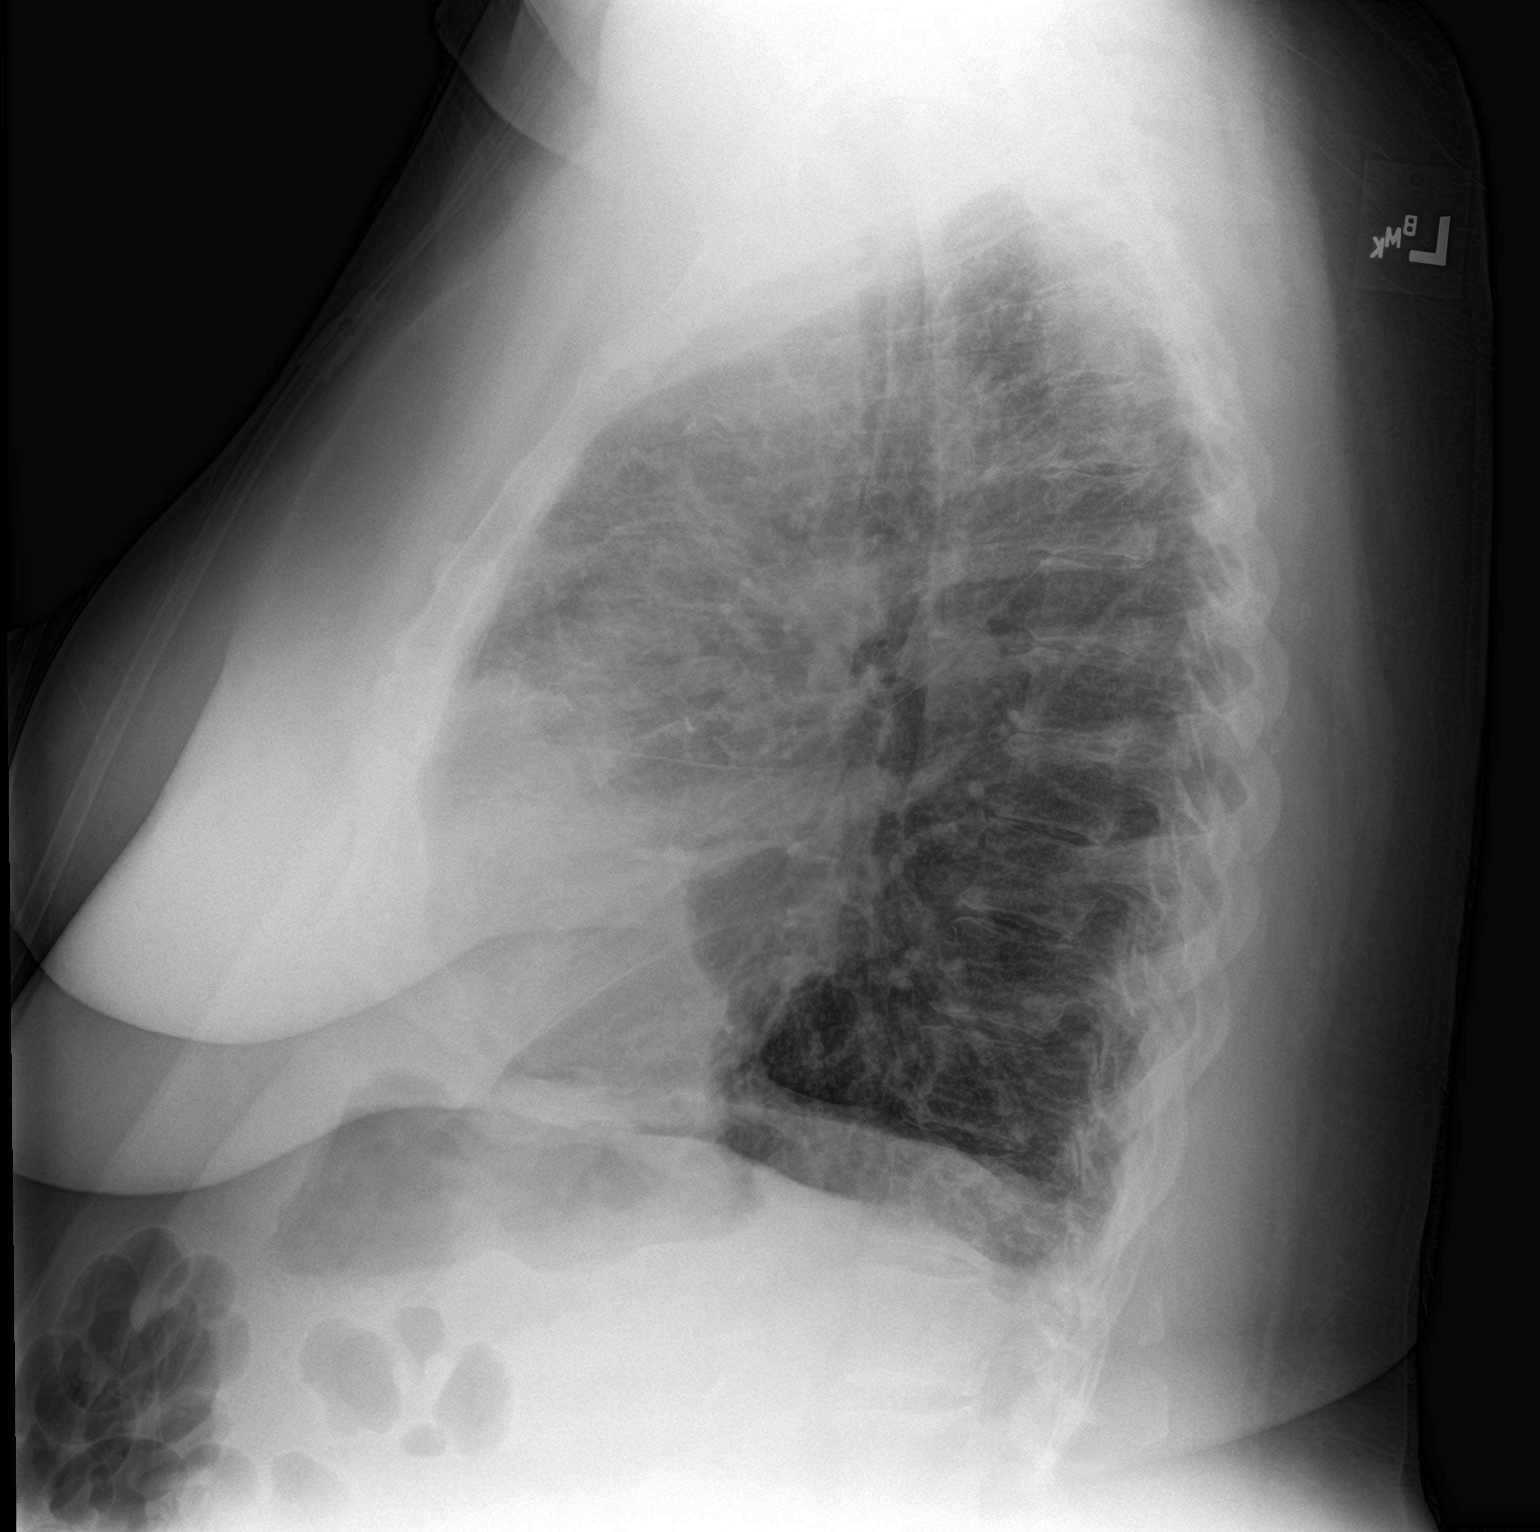

[2 of 2 positions shown; findings below may reference images not displayed]

FINDINGS: The lungs are well-aerated. Vascular congestion is noted. Increased
interstitial markings may reflect mild interstitial edema. There is
no evidence of pleural effusion or pneumothorax.

The heart is borderline enlarged. No acute osseous abnormalities are
seen.
IMPRESSION: Vascular congestion and borderline cardiomegaly. Increased
interstitial markings may reflect mild interstitial edema.

## 2017-05-09 ENCOUNTER — Other Ambulatory Visit: Payer: Self-pay

## 2017-05-09 DIAGNOSIS — F418 Other specified anxiety disorders: Secondary | ICD-10-CM

## 2017-05-11 ENCOUNTER — Encounter: Payer: Self-pay | Admitting: Nurse Practitioner

## 2017-05-11 ENCOUNTER — Ambulatory Visit: Payer: BLUE CROSS/BLUE SHIELD | Attending: Nurse Practitioner | Admitting: Nurse Practitioner

## 2017-05-11 VITALS — BP 127/53 | HR 73 | Temp 98.4°F | Resp 16 | Ht 62.5 in | Wt 237.0 lb

## 2017-05-11 DIAGNOSIS — G8929 Other chronic pain: Secondary | ICD-10-CM | POA: Diagnosis not present

## 2017-05-11 DIAGNOSIS — R7989 Other specified abnormal findings of blood chemistry: Secondary | ICD-10-CM

## 2017-05-11 DIAGNOSIS — Z5181 Encounter for therapeutic drug level monitoring: Secondary | ICD-10-CM | POA: Diagnosis not present

## 2017-05-11 DIAGNOSIS — M4696 Unspecified inflammatory spondylopathy, lumbar region: Secondary | ICD-10-CM

## 2017-05-11 DIAGNOSIS — F419 Anxiety disorder, unspecified: Secondary | ICD-10-CM | POA: Insufficient documentation

## 2017-05-11 DIAGNOSIS — G4730 Sleep apnea, unspecified: Secondary | ICD-10-CM | POA: Insufficient documentation

## 2017-05-11 DIAGNOSIS — M48061 Spinal stenosis, lumbar region without neurogenic claudication: Secondary | ICD-10-CM | POA: Diagnosis not present

## 2017-05-11 DIAGNOSIS — M79602 Pain in left arm: Secondary | ICD-10-CM

## 2017-05-11 DIAGNOSIS — Z79899 Other long term (current) drug therapy: Secondary | ICD-10-CM | POA: Insufficient documentation

## 2017-05-11 DIAGNOSIS — M1610 Unilateral primary osteoarthritis, unspecified hip: Secondary | ICD-10-CM | POA: Diagnosis not present

## 2017-05-11 DIAGNOSIS — M25512 Pain in left shoulder: Secondary | ICD-10-CM | POA: Diagnosis not present

## 2017-05-11 DIAGNOSIS — I110 Hypertensive heart disease with heart failure: Secondary | ICD-10-CM | POA: Insufficient documentation

## 2017-05-11 DIAGNOSIS — E559 Vitamin D deficiency, unspecified: Secondary | ICD-10-CM | POA: Diagnosis not present

## 2017-05-11 DIAGNOSIS — I509 Heart failure, unspecified: Secondary | ICD-10-CM | POA: Insufficient documentation

## 2017-05-11 DIAGNOSIS — G5603 Carpal tunnel syndrome, bilateral upper limbs: Secondary | ICD-10-CM | POA: Insufficient documentation

## 2017-05-11 DIAGNOSIS — Z9981 Dependence on supplemental oxygen: Secondary | ICD-10-CM | POA: Diagnosis not present

## 2017-05-11 DIAGNOSIS — Z79891 Long term (current) use of opiate analgesic: Secondary | ICD-10-CM | POA: Diagnosis not present

## 2017-05-11 DIAGNOSIS — Z885 Allergy status to narcotic agent status: Secondary | ICD-10-CM | POA: Diagnosis not present

## 2017-05-11 DIAGNOSIS — M545 Low back pain: Secondary | ICD-10-CM | POA: Diagnosis not present

## 2017-05-11 DIAGNOSIS — R079 Chest pain, unspecified: Secondary | ICD-10-CM | POA: Diagnosis not present

## 2017-05-11 DIAGNOSIS — M47816 Spondylosis without myelopathy or radiculopathy, lumbar region: Secondary | ICD-10-CM

## 2017-05-11 DIAGNOSIS — G894 Chronic pain syndrome: Secondary | ICD-10-CM

## 2017-05-11 DIAGNOSIS — F17210 Nicotine dependence, cigarettes, uncomplicated: Secondary | ICD-10-CM | POA: Diagnosis not present

## 2017-05-11 DIAGNOSIS — J449 Chronic obstructive pulmonary disease, unspecified: Secondary | ICD-10-CM | POA: Insufficient documentation

## 2017-05-11 DIAGNOSIS — M5116 Intervertebral disc disorders with radiculopathy, lumbar region: Secondary | ICD-10-CM | POA: Diagnosis not present

## 2017-05-11 DIAGNOSIS — M25511 Pain in right shoulder: Secondary | ICD-10-CM | POA: Insufficient documentation

## 2017-05-11 DIAGNOSIS — M791 Myalgia: Secondary | ICD-10-CM | POA: Insufficient documentation

## 2017-05-11 DIAGNOSIS — Z7982 Long term (current) use of aspirin: Secondary | ICD-10-CM | POA: Diagnosis not present

## 2017-05-11 DIAGNOSIS — Z6841 Body Mass Index (BMI) 40.0 and over, adult: Secondary | ICD-10-CM | POA: Insufficient documentation

## 2017-05-11 MED ORDER — HYDROCODONE-ACETAMINOPHEN 5-325 MG PO TABS: 1.0000 | ORAL_TABLET | Freq: Three times a day (TID) | ORAL | 0 refills | Status: DC | PRN

## 2017-05-11 NOTE — Progress Notes (Signed)
Nursing Pain Medication Assessment:  Safety precautions to be maintained throughout the outpatient stay will include: orient to surroundings, keep bed in low position, maintain call bell within reach at all times, provide assistance with transfer out of bed and ambulation.  Medication Inspection Compliance: Pill count conducted under aseptic conditions, in front of the patient. Neither the pills nor the bottle was removed from the patient's sight at any time. Once count was completed pills were immediately returned to the patient in their original bottle.  Medication: Hydrocodone/APAP Pill/Patch Count: 33 of 90 pills remain Pill/Patch Appearance: Markings consistent with prescribed medication Bottle Appearance: Standard pharmacy container. Clearly labeled. Filled Date: 07 / 05 / 2018 Last Medication intake:  Today

## 2017-05-11 NOTE — Patient Instructions (Addendum)
____________________________________________________________________________________________  Medication Rules  Applies to: All patients receiving prescriptions (written or electronic).  Pharmacy of record: Pharmacy where electronic prescriptions will be sent. If written prescriptions are taken to a different pharmacy, please inform the nursing staff. The pharmacy listed in the electronic medical record should be the one where you would like electronic prescriptions to be sent.  Prescription refills: Only during scheduled appointments. Applies to both, written and electronic prescriptions.  NOTE: The following applies primarily to controlled substances (Opioid* Pain Medications).   Patient's responsibilities: 1. Pain Pills: Bring all pain pills to every appointment (except for procedure appointments). 2. Pill Bottles: Bring pills in original pharmacy bottle. Always bring newest bottle. Bring bottle, even if empty. 3. Medication refills: You are responsible for knowing and keeping track of what medications you need refilled. The day before your appointment, write a list of all prescriptions that need to be refilled. Bring that list to your appointment and give it to the admitting nurse. Prescriptions will be written only during appointments. If you forget a medication, it will not be "Called in", "Faxed", or "electronically sent". You will need to get another appointment to get these prescribed. 4. Prescription Accuracy: You are responsible for carefully inspecting your prescriptions before leaving our office. Have the discharge nurse carefully go over each prescription with you, before taking them home. Make sure that your name is accurately spelled, that your address is correct. Check the name and dose of your medication to make sure it is accurate. Check the number of pills, and the written instructions to make sure they are clear and accurate. Make sure that you are given enough medication to  last until your next medication refill appointment. 5. Taking Medication: Take medication as prescribed. Never take more pills than instructed. Never take medication more frequently than prescribed. Taking less pills or less frequently is permitted and encouraged, when it comes to controlled substances (written prescriptions).  6. Inform other Doctors: Always inform, all of your healthcare providers, of all the medications you take. 7. Pain Medication from other Providers: You are not allowed to accept any additional pain medication from any other Doctor or Healthcare provider. There are two exceptions to this rule. (see below) In the event that you require additional pain medication, you are responsible for notifying us, as stated below. 8. Medication Agreement: You are responsible for carefully reading and following our Medication Agreement. This must be signed before receiving any prescriptions from our practice. Safely store a copy of your signed Agreement. Violations to the Agreement will result in no further prescriptions. (Additional copies of our Medication Agreement are available upon request.) 9. Laws, Rules, & Regulations: All patients are expected to follow all Federal and [employer], [employer], Rules, [employer] [facility]. Ignorance of the Laws does not constitute a valid excuse. The use of any illegal substances is prohibited. 10. Adopted CDC guidelines & recommendations: Target dosing levels will be at or below 60 MME/day. Use of benzodiazepines** is not recommended.  Exceptions: There are only two exceptions to the rule of not receiving pain medications from other Healthcare Providers. 1. Exception #1 (Emergencies): In the event of an emergency (i.e.: accident requiring emergency care), you are allowed to receive additional pain medication. However, you are responsible for: As soon as you are able, call our office [phone], at any time of the day or night, and leave a message stating your  name, the date and nature of the emergency, and the name and dose of the medication  prescribed. In the event that your call is answered by a member of our staff, make sure to document and save the date, time, and the name of the person that took your information.  2. Exception #2 (Planned Surgery): In the event that you are scheduled by another doctor or dentist to have any type of surgery or procedure, you are allowed (for a period no longer than 30 days), to receive additional pain medication, for the acute post-op pain. However, in this case, you are responsible for picking up a copy of our "Post-op Pain Management for Surgeons" handout, and giving it to your surgeon or dentist. This document is available at our office, and does not require an appointment to obtain it. Simply go to our office during business hours (Monday-Thursday from 8:00 AM to 4:00 PM) (Friday 8:00 AM to 12:00 Noon) or if you have a scheduled appointment with [place], prior to your surgery, and ask for it by name. In addition, you will need to provide [place] with your name, name of your surgeon, type of surgery, and date of procedure or surgery.  *Opioid medications include: morphine, codeine, oxycodone, oxymorphone, hydrocodone, hydromorphone, meperidine, tramadol, tapentadol, buprenorphine, fentanyl, methadone. **Benzodiazepine medications include: diazepam (Valium), alprazolam (Xanax), clonazepam (Klonopine), lorazepam (Ativan), clorazepate (Tranxene), chlordiazepoxide (Librium), estazolam (Prosom), oxazepam (Serax), temazepam (Restoril), triazolam (Halcion)  ____________________________________________________________________________________________ GENERAL RISKS AND COMPLICATIONS  What are the risk, side effects and possible complications? Generally speaking, most procedures are safe.  However, with any procedure there are risks, side effects, and the possibility of complications.  The risks and complications are dependent upon the sites  that are lesioned, or the type of nerve block to be performed.  The closer the procedure is to the spine, the more serious the risks are.  Great care is taken when placing the radio frequency needles, block needles or lesioning probes, but sometimes complications can occur. 1. Infection: Any time there is an injection through the skin, there is a risk of infection.  This is why sterile conditions are used for these blocks.  There are four possible types of infection. 1. Localized skin infection. 2. Central Nervous System Infection-This can be in the form of Meningitis, which can be deadly. 3. Epidural Infections-This can be in the form of an epidural abscess, which can cause pressure inside of the spine, causing compression of the spinal cord with subsequent paralysis. This would require an emergency surgery to decompress, and there are no guarantees that the patient would recover from the paralysis. 4. Discitis-This is an infection of the intervertebral discs.  It occurs in about 1% of discography procedures.  It is difficult to treat and it may lead to surgery.        2. Pain: the needles have to go through skin and soft tissues, will cause soreness.       3. Damage to internal structures:  The nerves to be lesioned may be near blood vessels or    other nerves which can be potentially damaged.       4. Bleeding: Bleeding is more common if the patient is taking blood thinners such as  aspirin, Coumadin, Ticiid, Plavix, etc., or if he/she have some genetic predisposition  such as hemophilia. Bleeding into the spinal canal can cause compression of the spinal  cord with subsequent paralysis.  This would require an emergency surgery to  decompress and there are no guarantees that the patient would recover from the  paralysis.       5.  Pneumothorax:  Puncturing of a lung is a possibility, every time a needle is introduced in  the area of the chest or upper back.  Pneumothorax refers to free air around the   collapsed lung(s), inside of the thoracic cavity (chest cavity).  Another two possible  complications related to a similar event would include: Hemothorax and Chylothorax.   These are variations of the Pneumothorax, where instead of air around the collapsed  lung(s), you may have blood or chyle, respectively.       6. Spinal headaches: They may occur with any procedures in the area of the spine.       7. Persistent CSF (Cerebro-Spinal Fluid) leakage: This is a rare problem, but may occur  with prolonged intrathecal or epidural catheters either due to the formation of a fistulous  track or a dural tear.       8. Nerve damage: By working so close to the spinal cord, there is always a possibility of  nerve damage, which could be as serious as a permanent spinal cord injury with  paralysis.       9. Death:  Although rare, severe deadly allergic reactions known as "Anaphylactic  reaction" can occur to any of the medications used.      10. Worsening of the symptoms:  We can always make thing worse.  What are the chances of something like this happening? Chances of any of this occuring are extremely low.  By statistics, you have more of a chance of getting killed in a motor vehicle accident: while driving to the hospital than any of the above occurring .  Nevertheless, you should be aware that they are possibilities.  In general, it is similar to taking a shower.  Everybody knows that you can slip, hit your head and get killed.  Does that mean that you should not shower again?  Nevertheless always keep in mind that statistics do not mean anything if you happen to be on the wrong side of them.  Even if a procedure has a 1 (one) in a 1,000,000 (million) chance of going wrong, it you happen to be that one..Also, keep in mind that by statistics, you have more of a chance of having something go wrong when taking medications.  Who should not have this procedure? If you are on a blood thinning medication (e.g.  Coumadin, Plavix, see list of "Blood Thinners"), or if you have an active infection going on, you should not have the procedure.  If you are taking any blood thinners, please inform your physician.  How should I prepare for this procedure?  Do not eat or drink anything at least six hours prior to the procedure.  Bring a driver with you .  It cannot be a taxi.  Come accompanied by an adult that can drive you back, and that is strong enough to help you if your legs get weak or numb from the local anesthetic.  Take all of your medicines the morning of the procedure with just enough water to swallow them.  If you have diabetes, make sure that you are scheduled to have your procedure done first thing in the morning, whenever possible.  If you have diabetes, take only half of your insulin dose and notify our nurse that you have done so as soon as you arrive at the clinic.  If you are diabetic, but only take blood sugar pills (oral hypoglycemic), then do not take them on the morning of your procedure.  You may take them after you have had the procedure.  Do not take aspirin or any aspirin-containing medications, at least eleven (11) days prior to the procedure.  They may prolong bleeding.  Wear loose fitting clothing that may be easy to take off and that you would not mind if it got stained with Betadine or blood.  Do not wear any jewelry or perfume  Remove any nail coloring.  It will interfere with some of our monitoring equipment.  NOTE: Remember that this is not meant to be interpreted as a complete list of all possible complications.  Unforeseen problems may occur.  BLOOD THINNERS The following drugs contain aspirin or other products, which can cause increased bleeding during surgery and should not be taken for 2 weeks prior to and 1 week after surgery.  If you should need take something for relief of minor pain, you may take acetaminophen which is found in Tylenol,m Datril, Anacin-3 and  Panadol. It is not blood thinner. The products listed below are.  Do not take any of the products listed below in addition to any listed on your instruction sheet.  A.P.C or A.P.C with Codeine Codeine Phosphate Capsules #3 Ibuprofen Ridaura  ABC compound Congesprin Imuran rimadil  Advil Cope Indocin Robaxisal  Alka-Seltzer Effervescent Pain Reliever and Antacid Coricidin or Coricidin-D  Indomethacin Rufen  Alka-Seltzer plus Cold Medicine Cosprin Ketoprofen S-A-C Tablets  Anacin Analgesic Tablets or Capsules Coumadin Korlgesic Salflex  Anacin Extra Strength Analgesic tablets or capsules CP-2 Tablets Lanoril Salicylate  Anaprox Cuprimine Capsules Levenox Salocol  Anexsia-D Dalteparin Magan Salsalate  Anodynos Darvon compound Magnesium Salicylate Sine-off  Ansaid Dasin Capsules Magsal Sodium Salicylate  Anturane Depen Capsules Marnal Soma  APF Arthritis pain formula Dewitt's Pills Measurin Stanback  Argesic Dia-Gesic Meclofenamic Sulfinpyrazone  Arthritis Bayer Timed Release Aspirin Diclofenac Meclomen Sulindac  Arthritis pain formula Anacin Dicumarol Medipren Supac  Analgesic (Safety coated) Arthralgen Diffunasal Mefanamic Suprofen  Arthritis Strength Bufferin Dihydrocodeine Mepro Compound Suprol  Arthropan liquid Dopirydamole Methcarbomol with Aspirin Synalgos  ASA tablets/Enseals Disalcid Micrainin Tagament  Ascriptin Doan's Midol Talwin  Ascriptin A/D Dolene Mobidin Tanderil  Ascriptin Extra Strength Dolobid Moblgesic Ticlid  Ascriptin with Codeine Doloprin or Doloprin with Codeine Momentum Tolectin  Asperbuf Duoprin Mono-gesic Trendar  Aspergum Duradyne Motrin or Motrin IB Triminicin  Aspirin plain, buffered or enteric coated Durasal Myochrisine Trigesic  Aspirin Suppositories Easprin Nalfon Trillsate  Aspirin with Codeine Ecotrin Regular or Extra Strength Naprosyn Uracel  Atromid-S Efficin Naproxen Ursinus  Auranofin Capsules Elmiron Neocylate Vanquish  Axotal Emagrin Norgesic  Verin  Azathioprine Empirin or Empirin with Codeine Normiflo Vitamin E  Azolid Emprazil Nuprin Voltaren  Bayer Aspirin plain, buffered or children's or timed BC Tablets or powders Encaprin Orgaran Warfarin Sodium  Buff-a-Comp Enoxaparin Orudis Zorpin  Buff-a-Comp with Codeine Equegesic Os-Cal-Gesic   Buffaprin Excedrin plain, buffered or Extra Strength Oxalid   Bufferin Arthritis Strength Feldene Oxphenbutazone   Bufferin plain or Extra Strength Feldene Capsules Oxycodone with Aspirin   Bufferin with Codeine Fenoprofen Fenoprofen Pabalate or Pabalate-SF   Buffets II Flogesic Panagesic   Buffinol plain or Extra Strength Florinal or Florinal with Codeine Panwarfarin   Buf-Tabs Flurbiprofen Penicillamine   Butalbital Compound Four-way cold tablets Penicillin   Butazolidin Fragmin Pepto-Bismol   Carbenicillin Geminisyn Percodan   Carna Arthritis Reliever Geopen Persantine   Carprofen Gold's salt Persistin   Chloramphenicol Goody's Phenylbutazone   Chloromycetin Haltrain Piroxlcam   Clmetidine heparin Plaquenil   Cllnoril Hyco-pap Ponstel   Clofibrate Hydroxy chloroquine  Propoxyphen         Before stopping any of these medications, be sure to consult the physician who ordered them.  Some, such as Coumadin (Warfarin) are ordered to prevent or treat serious conditions such as "deep thrombosis", "pumonary embolisms", and other heart problems.  The amount of time that you may need off of the medication may also vary with the medication and the reason for which you were taking it.  If you are taking any of these medications, please make sure you notify your pain physician before you undergo any procedures.

## 2017-05-11 NOTE — Progress Notes (Signed)
Review greater than 2Patient's Name: Theresa [name]  MRN: [id number]  Referring Provider: Margo Common, [place]  DOB: 16-Jan-1956  PCP: Margo Common, PA  DOS: 05/11/2017  Note by: Vevelyn Francois NP  Service setting: Ambulatory outpatient  Specialty: Interventional Pain Management  Location: [facility] (AMB) Pain Management Facility    Patient type: Established    Primary Reason(s) for Visit: Encounter for prescription drug management. (Level of risk: moderate)  CC: Back Pain (lower left)  HPI  Theresa [name] is a 61 y.o. year old, female patient, who comes today for a medication management evaluation. She has Neuritis or radiculitis due to rupture of lumbar intervertebral disc; Decreased motor strength; Essential (primary) hypertension; Clinical depression; DDD (degenerative disc disease), lumbar; CAFL (chronic airflow limitation) ([place]); Chronic rhinitis; Carpal tunnel syndrome; Blood clotting disorder ([place]); Anxiety state; Chronic diastolic heart failure ([place]); Chest pain; Tobacco abuse; Encounter for therapeutic drug level monitoring; Encounter for chronic pain management; Pain management; Neurogenic pain; Neuropathic pain; Musculoskeletal pain; Lumbar spondylosis (L4-5); Chronic low back pain (Location of Primary Source of Pain) (Left); Chronic lower extremity pain (Location of Secondary source of pain) (Left); Chronic lumbar radicular pain (L4 & S1 Dermatome) (Left); Chronic shoulder pain (Right side); Chronic upper extremity pain (Right-sided); Cervical radiculitis (Right side); Abnormal x-ray of lumbar spine; Chronic pain syndrome (significant psychosocial component); 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; Chronic obstructive pulmonary disease ([place]); Coagulation disorder ([place]); Chronic hip pain (Location of Tertiary Source of Pain) (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); 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); Obesity, Class III, BMI 40-49.9 (morbid obesity) ([place]); Pulmonary hypertensive arterial  disease ([place]); Vitamin D deficiency; Long term current use of opiate analgesic; Long term prescription opiate use; Opiate use (15 MME/Day); Lumbar facet syndrome (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); Chronic sacroiliac joint pain (Bilateral); Sleep apnea; Supplemental oxygen dependent; Lumbar facet hypertrophy (multilevel); Lumbar spinal stenosis (L4-5); 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 Her primarily concern today is the Back Pain (lower left)  Pain Assessment: Location: Lower, Left (left) Back (arm and shoulder) Radiating: na Onset: More than a month ago Duration: Chronic pain Quality: Constant, Aching, Sharp, Discomfort (throbbing, numbness and tingling in fingers on the left) Severity: 4 /10 (self-reported pain score)  Note: Reported level is compatible with observation.                   Effect on ADL: shoulder is aggravated by movement, reports weakness in hand and limited ROM in shoulder and arm on the left Timing: Constant (back pain is constant, shoulder is intermittent) Modifying factors: icy hot, or analagesic cream on shoulder helps, medications for back.  [name] was last scheduled for an appointment on Visit date not found for medication management. During today's appointment we reviewed Theresa [name]'s chronic pain status, as well as her outpatient medication regimen. The patient  reports that she does not use drugs. Her body mass index is 42.66 kg/m.    Further details on both, my assessment(s), as well as the proposed treatment plan, please see below.  Controlled Substance Pharmacotherapy Assessment REMS (Risk Evaluation and Mitigation Strategy)  Analgesic:Hydrocodone/APAP 5/325 one tablet every 8 hours (15 mg/day of hydrocodone) MME/day:15 mg/day Janett Billow, RN  05/11/2017 11:09 AM  Sign at close encounter Nursing Pain Medication Assessment:  Safety precautions to be maintained throughout the outpatient stay  will include: orient to surroundings, keep bed in low position, maintain call bell within reach at all times, provide assistance with transfer out of bed and ambulation.  Medication Inspection Compliance: Pill count conducted under aseptic conditions, in front of the patient. Neither the pills nor the bottle was removed from the patient's sight at any time. Once count was completed pills were immediately returned to the patient in their original bottle.  Medication: Hydrocodone/APAP Pill/Patch Count: 33 of 90 pills remain Pill/Patch Appearance: Markings consistent with prescribed medication Bottle Appearance: Standard pharmacy container. Clearly labeled. Filled Date: 07 / 05 / 2018 Last Medication intake:  Today   Pharmacokinetics: Liberation and absorption (onset of action): WNL Distribution (time to peak effect): WNL Metabolism and excretion (duration of action): WNL         Pharmacodynamics: Desired effects: Analgesia: Theresa [name] reports >50% benefit. Functional ability: Patient reports that medication allows her to accomplish basic ADLs Clinically meaningful improvement in function (CMIF): Sustained CMIF goals met Perceived effectiveness: Described as relatively effective, allowing for increase in activities of daily living (ADL) Undesirable effects: Side-effects or Adverse reactions: None reported Monitoring: [place] PMP: Online review of the past 68-monthperiod conducted. Compliant with practice rules and regulations List of all UDS test(s) done:  Lab Results  Component Value Date   TOXASSSELUR FINAL 03/01/2016   TOak LeafFINAL 01/12/2016   TDuncanFINAL 09/16/2015   SUMMARY FINAL 04/11/2017   SUMMARY CANCELED 02/16/2017   Last UDS on record: ToxAssure Select 13  Date Value Ref Range Status  03/01/2016 FINAL  Final     Comment:    ==================================================================== TOXASSURE SELECT 13 (MW) ==================================================================== Test                             Result       Flag       Units Drug Present and Declared for Prescription Verification   Hydrocodone                    863          EXPECTED   ng/mg creat   Hydromorphone                  195          EXPECTED   ng/mg creat   Dihydrocodeine                 175          EXPECTED   ng/mg creat   Norhydrocodone                 1078         EXPECTED   ng/mg creat    Sources of hydrocodone include scheduled prescription    medications. Hydromorphone, dihydrocodeine and norhydrocodone are    expected metabolites of hydrocodone. Hydromorphone and    dihydrocodeine are also available as scheduled prescription    medications. Drug Absent but Declared for Prescription Verification   Alprazolam                     Not Detected UNEXPECTED ng/mg creat ==================================================================== Test                      Result    Flag   Units      Ref Range   Creatinine  40               mg/dL      >=20 ==================================================================== Declared Medications:  The flagging and interpretation on this report are based on the  following declared medications.  Unexpected results may arise from  inaccuracies in the declared medications.  **Note: The testing scope of this panel includes these medications:  Alprazolam (Xanax)  Hydrocodone (Norco)  **Note: The testing scope of this panel does not include following  reported medications:  Acetaminophen (Norco)  Albuterol (Duoneb)  Albuterol (Proventil)  Amoxicillin  Aspirin  Bisoprolol (Ziac)  Caffeine  Cholecalciferol  Fluticasone (Advair)  Furosemide (Lasix)  Hydrochlorothiazide (Ziac)  Ipratropium (Duoneb)  Oxygen  Roflumilast (Daliresp)  Salmeterol (Advair)   Sertraline (Zoloft)  Tiotropium (Spiriva) ==================================================================== For clinical consultation, please call [phone]. ====================================================================    Summary  Date Value Ref Range Status  04/11/2017 FINAL  Final    Comment:    ==================================================================== TOXASSURE SELECT 13 (MW) ==================================================================== Specimen Alert Note:  Urinary creatinine is low; ability to detect some drugs may be compromised.  Interpret results with caution. ==================================================================== Test                             Result       Flag       Units Drug Present and Declared for Prescription Verification   Hydrocodone                    1156         EXPECTED   ng/mg creat   Norhydrocodone                 1550         EXPECTED   ng/mg creat    Sources of hydrocodone include scheduled prescription    medications. Norhydrocodone is an expected metabolite of    hydrocodone. Drug Absent but Declared for Prescription Verification   Alprazolam                     Not Detected UNEXPECTED ng/mg creat ==================================================================== Test                      Result    Flag   Units      Ref Range   Creatinine              16        L      mg/dL      >=20 ==================================================================== Declared Medications:  The flagging and interpretation on this report are based on the  following declared medications.  Unexpected results may arise from  inaccuracies in the declared medications.  **Note: The testing scope of this panel includes these medications:  Alprazolam  Hydrocodone (Hydrocodone-Acetaminophen)  **Note: The testing scope of this panel does not include following  reported medications:  Acetaminophen (Hydrocodone-Acetaminophen)   Albuterol  Albuterol (Ipratropium-Albuterol)  Bisoprolol  Caffeine (Caffeine/Aspirin)  Cholecalciferol  Fluticasone  Furosemide  Hydrochlorothiazide  Ipratropium (Ipratropium-Albuterol)  Menthol  Nicotine  Oxygen  Potassium  Ranitidine  Roflumilast  Salicylate (Caffeine/Aspirin)  Salmeterol  Sertraline ==================================================================== For clinical consultation, please call [phone]. ====================================================================    UDS interpretation: Compliant          Medication Assessment Form: Reviewed. Patient indicates being compliant with therapy Treatment compliance: Compliant Risk Assessment Profile: Aberrant behavior: See prior evaluations. None observed or detected today  Comorbid factors increasing risk of overdose: See prior notes. No additional risks detected today Risk of substance use disorder (SUD): Low Opioid Risk Tool (ORT) Total Score: 3  Interpretation Table:  Score <3 = Low Risk for SUD  Score between 4-7 = Moderate Risk for SUD  Score >8 = High Risk for Opioid Abuse   Risk Mitigation Strategies:  Patient Counseling: Covered Patient-Prescriber Agreement (PPA): Present and active  Notification to other healthcare providers: Done  Pharmacologic Plan: No change in therapy, at this time  Laboratory Chemistry  Inflammation Markers (CRP: Acute Phase) (ESR: Chronic Phase) Lab Results  Component Value Date   CRP 1.1 (H) 09/17/2015   ESRSEDRATE 5 09/17/2015                 Renal Function Markers Lab Results  Component Value Date   BUN 17 03/02/2017   CREATININE 0.79 03/02/2017   GFRAA 95 03/02/2017   GFRNONAA 82 03/02/2017                 Hepatic Function Markers Lab Results  Component Value Date   AST 12 03/02/2017   ALT 17 03/02/2017   ALBUMIN 4.4 03/02/2017   ALKPHOS 75 03/02/2017                 Electrolytes Lab Results  Component Value Date   NA 141 03/02/2017    K 4.6 03/02/2017   CL 98 03/02/2017   CALCIUM 9.7 03/02/2017   MG 2.1 02/13/2017                 Neuropathy Markers No results found for: [id number]               Bone Pathology Markers Lab Results  Component Value Date   ALKPHOS 75 03/02/2017   25OHVITD1 13 (L) 09/17/2015   25OHVITD2 <1.0 09/17/2015   25OHVITD3 13 09/17/2015   CALCIUM 9.7 03/02/2017                 Coagulation Parameters Lab Results  Component Value Date   PLT 206 03/02/2017                 Cardiovascular Markers Lab Results  Component Value Date   BNP 40.2 05/11/2017   HGB 15.7 03/02/2017   HCT 48.8 (H) 03/02/2017                 Note: Lab results reviewed.  Recent Diagnostic Imaging Review  Dg C-arm 1-60 Min-no Report  Result Date: 02/22/2017 Fluoroscopy was utilized by the requesting physician.  No radiographic interpretation.   Note: Imaging results reviewed.          Meds   Current Meds  Medication Sig  . albuterol (PROVENTIL HFA;VENTOLIN HFA) 108 (90 Base) MCG/ACT inhaler Inhale 2 puffs into the lungs every 6 (six) hours as needed for wheezing. Reported on 11/03/2015  . ALPRAZolam (XANAX) 0.5 MG tablet Take 1 tablet (0.5 mg total) by mouth 2 (two) times daily as needed for anxiety. (Patient taking differently: Take 0.25 mg by mouth 2 (two) times daily as needed for anxiety. )  . Aspirin-Caffeine (BC FAST PAIN RELIEF ARTHRITIS) 1000-65 MG PACK Take 1 packet by mouth every 6 (six) hours as needed (Pain).  . bisoprolol-hydrochlorothiazide (ZIAC) 10-6.25 MG tablet TAKE 1 TABLET BY MOUTH DAILY  . Cholecalciferol ([employer] D3) 2000 units CAPS Take 1 capsule by mouth daily.  . Fluticasone-Salmeterol (ADVAIR DISKUS) 250-50 MCG/DOSE AEPB INHALE 1 PUFF INTO LUNGS TWICE A  DAY  . furosemide (LASIX) 40 MG tablet Take 1 tablet (40 mg total) by mouth daily. (Patient taking differently: Take 40 mg by mouth 2 (two) times daily. )  . ipratropium-albuterol (DUONEB) 0.5-2.5 (3) MG/3ML SOLN Take 3 mLs by nebulization  every 6 (six) hours as needed (shortness of breath).  . Menthol, Topical Analgesic, (BENGAY COLD THERAPY) 5 % GEL Apply 1 application topically daily.  . nicotine polacrilex (CVS NICOTINE POLACRILEX) 2 MG gum Take 1 each (2 mg total) by mouth as needed for smoking cessation.  . OXYGEN Inhale 2.5 L into the lungs continuous. Reported on 11/03/2015  . potassium chloride SA (K-DUR,KLOR-CON) 20 MEQ tablet Take 1 tablet (20 mEq total) by mouth daily. (Patient taking differently: Take 20 mEq by mouth 2 (two) times daily. )  . ranitidine (ZANTAC) 150 MG tablet Take 150 mg by mouth 2 (two) times daily.  . roflumilast (DALIRESP) 500 MCG TABS tablet Take 500 mcg by mouth daily.  . sertraline (ZOLOFT) 100 MG tablet TAKE 1.5 TABLET (150 MG TOTAL) BY MOUTH DAILY.    ROS  Constitutional: Denies any fever or chills Gastrointestinal: No reported hemesis, hematochezia, vomiting, or acute GI distress Musculoskeletal: Denies any acute onset joint swelling, redness, loss of ROM, or weakness Neurological: No reported episodes of acute onset apraxia, aphasia, dysarthria, agnosia, amnesia, paralysis, loss of coordination, or loss of consciousness  Allergies  Ms. Vanderwerf is allergic to codeine and meloxicam.  PFSH  Drug: Ms. Mervin  reports that she does not use drugs. Alcohol:  reports that she does not drink alcohol. Tobacco:  reports that she has been smoking Cigarettes.  She has a 40.00 pack-year smoking history. She has never used smokeless tobacco. Medical:  has a past medical history of Anxiety; Carpal tunnel syndrome; CHF (congestive heart failure) ([place]); CHF (congestive heart failure) ([place]); Chronic generalized abdominal pain; Chronic rhinitis; COPD (chronic obstructive pulmonary disease) ([facility]); DDD (degenerative disc disease), cervical; Depression; Edema; Flu; Gastritis; Hematuria; Hypertension; Kidney stones; Low back pain; Lumbar radiculitis; Malodorous urine; Muscle weakness; Obesity; Renal cyst; Sensory  urge incontinence; and Tobacco abuse. Surgical: Ms. Monjaras  has a past surgical history that includes Tubal ligation; Abdominal hysterectomy; Tonsillectomy; and Carpal tunnel release (Left). Family: family history includes Alcohol abuse in her father; Coronary artery disease in her mother; Heart disease in her father and mother; Lung cancer in her sister; Stroke in her mother.  Constitutional Exam  General appearance: Well nourished, well developed, and well hydrated. In no apparent acute distress Vitals:   05/11/17 1059  BP: (!) 127/53  Pulse: 73  Resp: 16  Temp: 98.4 F (36.9 C)  TempSrc: Oral  SpO2: 92%  Weight: 237 lb (107.5 kg)  Height: 5' 2.5" (1.588 m)   BMI Assessment: Estimated body mass index is 42.66 kg/m as calculated from the following:   Height as of this encounter: 5' 2.5" (1.588 m).   Weight as of this encounter: 237 lb (107.5 kg).  BMI interpretation table: BMI level Category Range association with higher incidence of chronic pain  <18 kg/m2 Underweight   18.5-24.9 kg/m2 Ideal body weight   25-29.9 kg/m2 Overweight Increased incidence by 20%  30-34.9 kg/m2 Obese (Class I) Increased incidence by 68%  35-39.9 kg/m2 Severe obesity (Class II) Increased incidence by 136%  >40 kg/m2 Extreme obesity (Class III) Increased incidence by 254%   BMI Readings from Last 4 Encounters:  05/11/17 42.66 kg/m  05/04/17 43.47 kg/m  04/17/17 42.55 kg/m  04/11/17 41.98 kg/m   Wt  Readings from Last 4 Encounters:  05/11/17 237 lb (107.5 kg)  05/04/17 245 lb 6 oz (111.3 kg)  04/17/17 240 lb 3 oz (108.9 kg)  04/11/17 237 lb (107.5 kg)  Psych/Mental status: Alert, oriented x 3 (person, place, & time)       Eyes: PERLA Respiratory: No evidence of acute respiratory distress  Cervical Spine Exam  Inspection: No masses, redness, or swelling Alignment: Symmetrical Functional ROM: Unrestricted ROM      Stability: No instability detected Muscle strength & Tone: Functionally  intact Sensory: Unimpaired Palpation: No palpable anomalies              Upper Extremity (UE) Exam    Side: Right upper extremity  Side: Left upper extremity  Inspection: No masses, redness, swelling, or asymmetry. No contractures  Inspection: No masses, redness, swelling, or asymmetry. No contractures  Functional ROM: Unrestricted ROM          Functional ROM: Unrestricted ROM          Muscle strength & Tone: Functionally intact  Muscle strength & Tone: Functionally intact  Sensory: Unimpaired  Sensory: Unimpaired  Palpation: No palpable anomalies              Palpation: Complains of area being tender to palpation a              Specialized Test(s): Deferred         Specialized Test(s): Deferred          Thoracic Spine Exam  Inspection: No masses, redness, or swelling Alignment: Symmetrical Functional ROM: Unrestricted ROM Stability: No instability detected Sensory: Unimpaired Muscle strength & Tone: No palpable anomalies  Lumbar Spine Exam  Inspection: No masses, redness, or swelling Alignment: Symmetrical Functional ROM: Unrestricted ROM      Stability: No instability detected Muscle strength & Tone: Functionally intact Sensory: Unimpaired Palpation: Complains of area being tender to palpation       Provocative Tests: Lumbar Hyperextension and rotation test: evaluation deferred today       Lumbar Lateral bending test: evaluation deferred today       Patrick's Maneuver: evaluation deferred today                    Gait & Posture Assessment  Ambulation: Unassisted Gait: Relatively normal for age and body habitus Posture: WNL   Lower Extremity Exam    Side: Right lower extremity  Side: Left lower extremity  Inspection: No masses, redness, swelling, or asymmetry. No contractures  Inspection: No masses, redness, swelling, or asymmetry. No contractures  Functional ROM: Unrestricted ROM          Functional ROM: Unrestricted ROM          Muscle strength & Tone: Functionally  intact  Muscle strength & Tone: Functionally intact  Sensory: Unimpaired  Sensory: Unimpaired  Palpation: No palpable anomalies  Palpation: No palpable anomalies   Assessment  Primary Diagnosis & Pertinent Problem List: The primary encounter diagnosis was Chronic left shoulder pain. Diagnoses of Chronic pain of left upper extremity, Lumbar facet syndrome (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), Elevated brain natriuretic peptide (BNP) level, Chronic pain syndrome, and Long term current use of opiate analgesic were also pertinent to this visit.  Status Diagnosis  Controlled Controlled Controlled 1. Chronic left shoulder pain   2. Chronic pain of left upper extremity   3. Lumbar facet syndrome (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)   4. Elevated brain natriuretic peptide (BNP) level   5. Chronic pain syndrome   6. Long  term current use of opiate analgesic     Problems updated and reviewed during this visit: No problems updated. Plan of Care  Pharmacotherapy (Medications Ordered): Meds ordered this encounter  Medications  . HYDROcodone-acetaminophen (NORCO/VICODIN) 5-325 MG tablet    Sig: Take 1 tablet by mouth every 8 (eight) hours as needed for severe pain.    Dispense:  90 tablet    Refill:  0    Do not add this medication to the electronic "Automatic Refill" notification system. Patient may have prescription filled one day early if pharmacy is closed on scheduled refill date. Do not fill until: 05/17/2017 To last until: 06/16/2017    Order Specific Question:   Supervising Provider    Answer:   Milinda Pointer [phone]  . HYDROcodone-acetaminophen (NORCO/VICODIN) 5-325 MG tablet    Sig: Take 1 tablet by mouth every 8 (eight) hours as needed for severe pain.    Dispense:  90 tablet    Refill:  0    Do not add this medication to the electronic "Automatic Refill" notification system. Patient may have prescription filled one day early if pharmacy is closed on scheduled refill date. Do not fill until:06/16/2017 To last until:  07/16/2017    Order Specific Question:   Supervising Provider    Answer:   Milinda Pointer [phone]  . HYDROcodone-acetaminophen (NORCO/VICODIN) 5-325 MG tablet    Sig: Take 1 tablet by mouth every 8 (eight) hours as needed for severe pain.    Dispense:  90 tablet    Refill:  0    Do not add this medication to the electronic "Automatic Refill" notification system. Patient may have prescription filled one day early if pharmacy is closed on scheduled refill date. Do not fill until: 07/16/2017 To last until: 08/15/2017    Order Specific Question:   Supervising Provider    Answer:   Milinda Pointer [phone]   New Prescriptions   No medications on file   Medications administered today: Ms. Trouten had no medications administered during this visit. Lab-work, procedure(s), and/or referral(s): Orders Placed This Encounter  Procedures  . SHOULDER INJECTION  . ToxASSURE Select 13 (MW), Urine  . Brain natriuretic peptide   Imaging and/or referral(s): None  Interventional therapies: Planned, scheduled, and/or pending:   Left shoulder intra-arcular steroid injections Therapeutic Left lumbar facet RFA, scheduled   C for a onsidering:   Diagnostic bilateral lumbar facet block Possible bilateral lumbar facet RFA   Palliative PRN treatment(s):   Palliative left sided lumbar facetblock    Provider-requested follow-up: Return in about 3 months (around 08/11/2017) for MedMgmt, Procedure(NS), w/ Dr. Dossie Arbour, ([facility]).  Future Appointments Date Time Provider [place]  05/23/2017 1:20 PM [facility]  05/23/2017 1:40 PM [facility] DEX[facility] 1 [facility]  06/05/2017 2:00 PM [name], FNP [facility] None  06/29/2017 10:15 AM Milinda Pointer, MD [facility] None  08/08/2017 12:45 PM Vevelyn Francois, NP [facility] None  04/10/2018 3:00 PM Harlin Heys, MD [facility] None   Primary Care Physician: Margo Common, PA Location: [facility]  Facility Note by: Vevelyn Francois NP Date: 05/11/2017; Time: 4:52 PM  Pain Score Disclaimer: We use the NRS-11 scale. This is a self-reported, subjective measurement of pain severity with only modest accuracy. It is used primarily to identify changes within a particular patient. It must be understood that outpatient pain scales are significantly less accurate that those used for research, where they can be applied under ideal controlled circumstances with  minimal exposure to variables. In reality, the score is likely to be a combination of pain intensity and pain affect, where pain affect describes the degree of emotional arousal or changes in action readiness caused by the sensory experience of pain. Factors such as social and work situation, setting, emotional state, anxiety levels, expectation, and prior pain experience may influence pain perception and show large inter-individual differences that may also be affected by time variables.  Patient instructions provided during this appointment: Patient Instructions    ____________________________________________________________________________________________  Medication Rules  Applies to: All patients receiving prescriptions (written or electronic).  Pharmacy of record: Pharmacy where electronic prescriptions will be sent. If written prescriptions are taken to a different pharmacy, please inform the nursing staff. The pharmacy listed in the electronic medical record should be the one where you would like electronic prescriptions to be sent.  Prescription refills: Only during scheduled appointments. Applies to both, written and electronic prescriptions.  NOTE: The following applies primarily to controlled substances (Opioid* Pain Medications).   Patient's responsibilities: 1. Pain Pills: Bring all pain pills to every appointment (except for procedure appointments). 2. Pill Bottles: Bring pills in original pharmacy bottle. Always bring newest  bottle. Bring bottle, even if empty. 3. Medication refills: You are responsible for knowing and keeping track of what medications you need refilled. The day before your appointment, write a list of all prescriptions that need to be refilled. Bring that list to your appointment and give it to the admitting nurse. Prescriptions will be written only during appointments. If you forget a medication, it will not be "Called in", "Faxed", or "electronically sent". You will need to get another appointment to get these prescribed. 4. Prescription Accuracy: You are responsible for carefully inspecting your prescriptions before leaving our office. Have the discharge nurse carefully go over each prescription with you, before taking them home. Make sure that your name is accurately spelled, that your address is correct. Check the name and dose of your medication to make sure it is accurate. Check the number of pills, and the written instructions to make sure they are clear and accurate. Make sure that you are given enough medication to last until your next medication refill appointment. 5. Taking Medication: Take medication as prescribed. Never take more pills than instructed. Never take medication more frequently than prescribed. Taking less pills or less frequently is permitted and encouraged, when it comes to controlled substances (written prescriptions).  6. Inform other Doctors: Always inform, all of your healthcare providers, of all the medications you take. 7. Pain Medication from other Providers: You are not allowed to accept any additional pain medication from any other Doctor or Healthcare provider. There are two exceptions to this rule. (see below) In the event that you require additional pain medication, you are responsible for notifying us, as stated below. 8. Medication Agreement: You are responsible for carefully reading and following our Medication Agreement. This must be signed before receiving any  prescriptions from our practice. Safely store a copy of your signed Agreement. Violations to the Agreement will result in no further prescriptions. (Additional copies of our Medication Agreement are available upon request.) 9. Laws, Rules, & Regulations: All patients are expected to follow all Federal and [employer], [employer], Rules, [employer] [facility]. Ignorance of the Laws does not constitute a valid excuse. The use of any illegal substances is prohibited. 10. Adopted CDC guidelines & recommendations: Target dosing levels will be at or below 60 MME/day. Use of benzodiazepines** is not recommended.  Exceptions: There are only two exceptions to the rule of not receiving pain medications from other Healthcare Providers. 1. Exception #1 (Emergencies): In the event of an emergency (i.e.: accident requiring emergency care), you are allowed to receive additional pain medication. However, you are responsible for: As soon as you are able, call our office [phone], at any time of the day or night, and leave a message stating your name, the date and nature of the emergency, and the name and dose of the medication prescribed. In the event that your call is answered by a member of our staff, make sure to document and save the date, time, and the name of the person that took your information.  2. Exception #2 (Planned Surgery): In the event that you are scheduled by another doctor or dentist to have any type of surgery or procedure, you are allowed (for a period no longer than 30 days), to receive additional pain medication, for the acute post-op pain. However, in this case, you are responsible for picking up a copy of our "Post-op Pain Management for Surgeons" handout, and giving it to your surgeon or dentist. This document is available at our office, and does not require an appointment to obtain it. Simply go to our office during business hours (Monday-Thursday from 8:00 AM to 4:00 PM) (Friday 8:00 AM to 12:00 Noon) or  if you have a scheduled appointment with [place], prior to your surgery, and ask for it by name. In addition, you will need to provide [place] with your name, name of your surgeon, type of surgery, and date of procedure or surgery.  *Opioid medications include: morphine, codeine, oxycodone, oxymorphone, hydrocodone, hydromorphone, meperidine, tramadol, tapentadol, buprenorphine, fentanyl, methadone. **Benzodiazepine medications include: diazepam (Valium), alprazolam (Xanax), clonazepam (Klonopine), lorazepam (Ativan), clorazepate (Tranxene), chlordiazepoxide (Librium), estazolam (Prosom), oxazepam (Serax), temazepam (Restoril), triazolam (Halcion)  ____________________________________________________________________________________________ GENERAL RISKS AND COMPLICATIONS  What are the risk, side effects and possible complications? Generally speaking, most procedures are safe.  However, with any procedure there are risks, side effects, and the possibility of complications.  The risks and complications are dependent upon the sites that are lesioned, or the type of nerve block to be performed.  The closer the procedure is to the spine, the more serious the risks are.  Great care is taken when placing the radio frequency needles, block needles or lesioning probes, but sometimes complications can occur. 1. Infection: Any time there is an injection through the skin, there is a risk of infection.  This is why sterile conditions are used for these blocks.  There are four possible types of infection. 1. Localized skin infection. 2. Central Nervous System Infection-This can be in the form of Meningitis, which can be deadly. 3. Epidural Infections-This can be in the form of an epidural abscess, which can cause pressure inside of the spine, causing compression of the spinal cord with subsequent paralysis. This would require an emergency surgery to decompress, and there are no guarantees that the patient would recover from  the paralysis. 4. Discitis-This is an infection of the intervertebral discs.  It occurs in about 1% of discography procedures.  It is difficult to treat and it may lead to surgery.        2. Pain: the needles have to go through skin and soft tissues, will cause soreness.       3. Damage to internal structures:  The nerves to be lesioned may be near blood vessels or    other nerves which can  be potentially damaged.       4. Bleeding: Bleeding is more common if the patient is taking blood thinners such as  aspirin, Coumadin, Ticiid, Plavix, etc., or if he/she have some genetic predisposition  such as hemophilia. Bleeding into the spinal canal can cause compression of the spinal  cord with subsequent paralysis.  This would require an emergency surgery to  decompress and there are no guarantees that the patient would recover from the  paralysis.       5. Pneumothorax:  Puncturing of a lung is a possibility, every time a needle is introduced in  the area of the chest or upper back.  Pneumothorax refers to free air around the  collapsed lung(s), inside of the thoracic cavity (chest cavity).  Another two possible  complications related to a similar event would include: Hemothorax and Chylothorax.   These are variations of the Pneumothorax, where instead of air around the collapsed  lung(s), you may have blood or chyle, respectively.       6. Spinal headaches: They may occur with any procedures in the area of the spine.       7. Persistent CSF (Cerebro-Spinal Fluid) leakage: This is a rare problem, but may occur  with prolonged intrathecal or epidural catheters either due to the formation of a fistulous  track or a dural tear.       8. Nerve damage: By working so close to the spinal cord, there is always a possibility of  nerve damage, which could be as serious as a permanent spinal cord injury with  paralysis.       9. Death:  Although rare, severe deadly allergic reactions known as "Anaphylactic  reaction" can  occur to any of the medications used.      10. Worsening of the symptoms:  We can always make thing worse.  What are the chances of something like this happening? Chances of any of this occuring are extremely low.  By statistics, you have more of a chance of getting killed in a motor vehicle accident: while driving to the hospital than any of the above occurring .  Nevertheless, you should be aware that they are possibilities.  In general, it is similar to taking a shower.  Everybody knows that you can slip, hit your head and get killed.  Does that mean that you should not shower again?  Nevertheless always keep in mind that statistics do not mean anything if you happen to be on the wrong side of them.  Even if a procedure has a 1 (one) in a 1,000,000 (million) chance of going wrong, it you happen to be that one..Also, keep in mind that by statistics, you have more of a chance of having something go wrong when taking medications.  Who should not have this procedure? If you are on a blood thinning medication (e.g. Coumadin, Plavix, see list of "Blood Thinners"), or if you have an active infection going on, you should not have the procedure.  If you are taking any blood thinners, please inform your physician.  How should I prepare for this procedure?  Do not eat or drink anything at least six hours prior to the procedure.  Bring a driver with you .  It cannot be a taxi.  Come accompanied by an adult that can drive you back, and that is strong enough to help you if your legs get weak or numb from the local anesthetic.  Take all of your medicines the  morning of the procedure with just enough water to swallow them.  If you have diabetes, make sure that you are scheduled to have your procedure done first thing in the morning, whenever possible.  If you have diabetes, take only half of your insulin dose and notify our nurse that you have done so as soon as you arrive at the clinic.  If you are  diabetic, but only take blood sugar pills (oral hypoglycemic), then do not take them on the morning of your procedure.  You may take them after you have had the procedure.  Do not take aspirin or any aspirin-containing medications, at least eleven (11) days prior to the procedure.  They may prolong bleeding.  Wear loose fitting clothing that may be easy to take off and that you would not mind if it got stained with Betadine or blood.  Do not wear any jewelry or perfume  Remove any nail coloring.  It will interfere with some of our monitoring equipment.  NOTE: Remember that this is not meant to be interpreted as a complete list of all possible complications.  Unforeseen problems may occur.  BLOOD THINNERS The following drugs contain aspirin or other products, which can cause increased bleeding during surgery and should not be taken for 2 weeks prior to and 1 week after surgery.  If you should need take something for relief of minor pain, you may take acetaminophen which is found in Tylenol,m Datril, Anacin-3 and Panadol. It is not blood thinner. The products listed below are.  Do not take any of the products listed below in addition to any listed on your instruction sheet.  A.P.C or A.P.C with Codeine Codeine Phosphate Capsules #3 Ibuprofen Ridaura  ABC compound Congesprin Imuran rimadil  Advil Cope Indocin Robaxisal  Alka-Seltzer Effervescent Pain Reliever and Antacid Coricidin or Coricidin-D  Indomethacin Rufen  Alka-Seltzer plus Cold Medicine Cosprin Ketoprofen S-A-C Tablets  Anacin Analgesic Tablets or Capsules Coumadin Korlgesic Salflex  Anacin Extra Strength Analgesic tablets or capsules CP-2 Tablets Lanoril Salicylate  Anaprox Cuprimine Capsules Levenox Salocol  Anexsia-D Dalteparin Magan Salsalate  Anodynos Darvon compound Magnesium Salicylate Sine-off  Ansaid Dasin Capsules Magsal Sodium Salicylate  Anturane Depen Capsules Marnal Soma  APF Arthritis pain formula Dewitt's Pills  Measurin Stanback  Argesic Dia-Gesic Meclofenamic Sulfinpyrazone  Arthritis Bayer Timed Release Aspirin Diclofenac Meclomen Sulindac  Arthritis pain formula Anacin Dicumarol Medipren Supac  Analgesic (Safety coated) Arthralgen Diffunasal Mefanamic Suprofen  Arthritis Strength Bufferin Dihydrocodeine Mepro Compound Suprol  Arthropan liquid Dopirydamole Methcarbomol with Aspirin Synalgos  ASA tablets/Enseals Disalcid Micrainin Tagament  Ascriptin Doan's Midol Talwin  Ascriptin A/D Dolene Mobidin Tanderil  Ascriptin Extra Strength Dolobid Moblgesic Ticlid  Ascriptin with Codeine Doloprin or Doloprin with Codeine Momentum Tolectin  Asperbuf Duoprin Mono-gesic Trendar  Aspergum Duradyne Motrin or Motrin IB Triminicin  Aspirin plain, buffered or enteric coated Durasal Myochrisine Trigesic  Aspirin Suppositories Easprin Nalfon Trillsate  Aspirin with Codeine Ecotrin Regular or Extra Strength Naprosyn Uracel  Atromid-S Efficin Naproxen Ursinus  Auranofin Capsules Elmiron Neocylate Vanquish  Axotal Emagrin Norgesic Verin  Azathioprine Empirin or Empirin with Codeine Normiflo Vitamin E  Azolid Emprazil Nuprin Voltaren  Bayer Aspirin plain, buffered or children's or timed BC Tablets or powders Encaprin Orgaran Warfarin Sodium  Buff-a-Comp Enoxaparin Orudis Zorpin  Buff-a-Comp with Codeine Equegesic Os-Cal-Gesic   Buffaprin Excedrin plain, buffered or Extra Strength Oxalid   Bufferin Arthritis Strength Feldene Oxphenbutazone   Bufferin plain or Extra Strength Feldene Capsules Oxycodone with Aspirin  Bufferin with Codeine Fenoprofen Fenoprofen Pabalate or Pabalate-SF   Buffets II Flogesic Panagesic   Buffinol plain or Extra Strength Florinal or Florinal with Codeine Panwarfarin   Buf-Tabs Flurbiprofen Penicillamine   Butalbital Compound Four-way cold tablets Penicillin   Butazolidin Fragmin Pepto-Bismol   Carbenicillin Geminisyn Percodan   Carna Arthritis Reliever Geopen Persantine    Carprofen Gold's salt Persistin   Chloramphenicol Goody's Phenylbutazone   Chloromycetin Haltrain Piroxlcam   Clmetidine heparin Plaquenil   Cllnoril Hyco-pap Ponstel   Clofibrate Hydroxy chloroquine Propoxyphen         Before stopping any of these medications, be sure to consult the physician who ordered them.  Some, such as Coumadin (Warfarin) are ordered to prevent or treat serious conditions such as "deep thrombosis", "pumonary embolisms", and other heart problems.  The amount of time that you may need off of the medication may also vary with the medication and the reason for which you were taking it.  If you are taking any of these medications, please make sure you notify your pain physician before you undergo any procedures.

## 2017-05-12 LAB — BRAIN NATRIURETIC PEPTIDE: BNP: 40.2 pg/mL (ref 0.0–100.0)

## 2017-05-12 IMAGING — DX DG CHEST 1V PORT
1 series · 1 of 1 positions shown · non-contrast
Comparison: Radiograph October 27, 2016.

CLINICAL DATA: Shortness of breath, cough.

EXAM:
PORTABLE CHEST 1 VIEW

[chest ap]
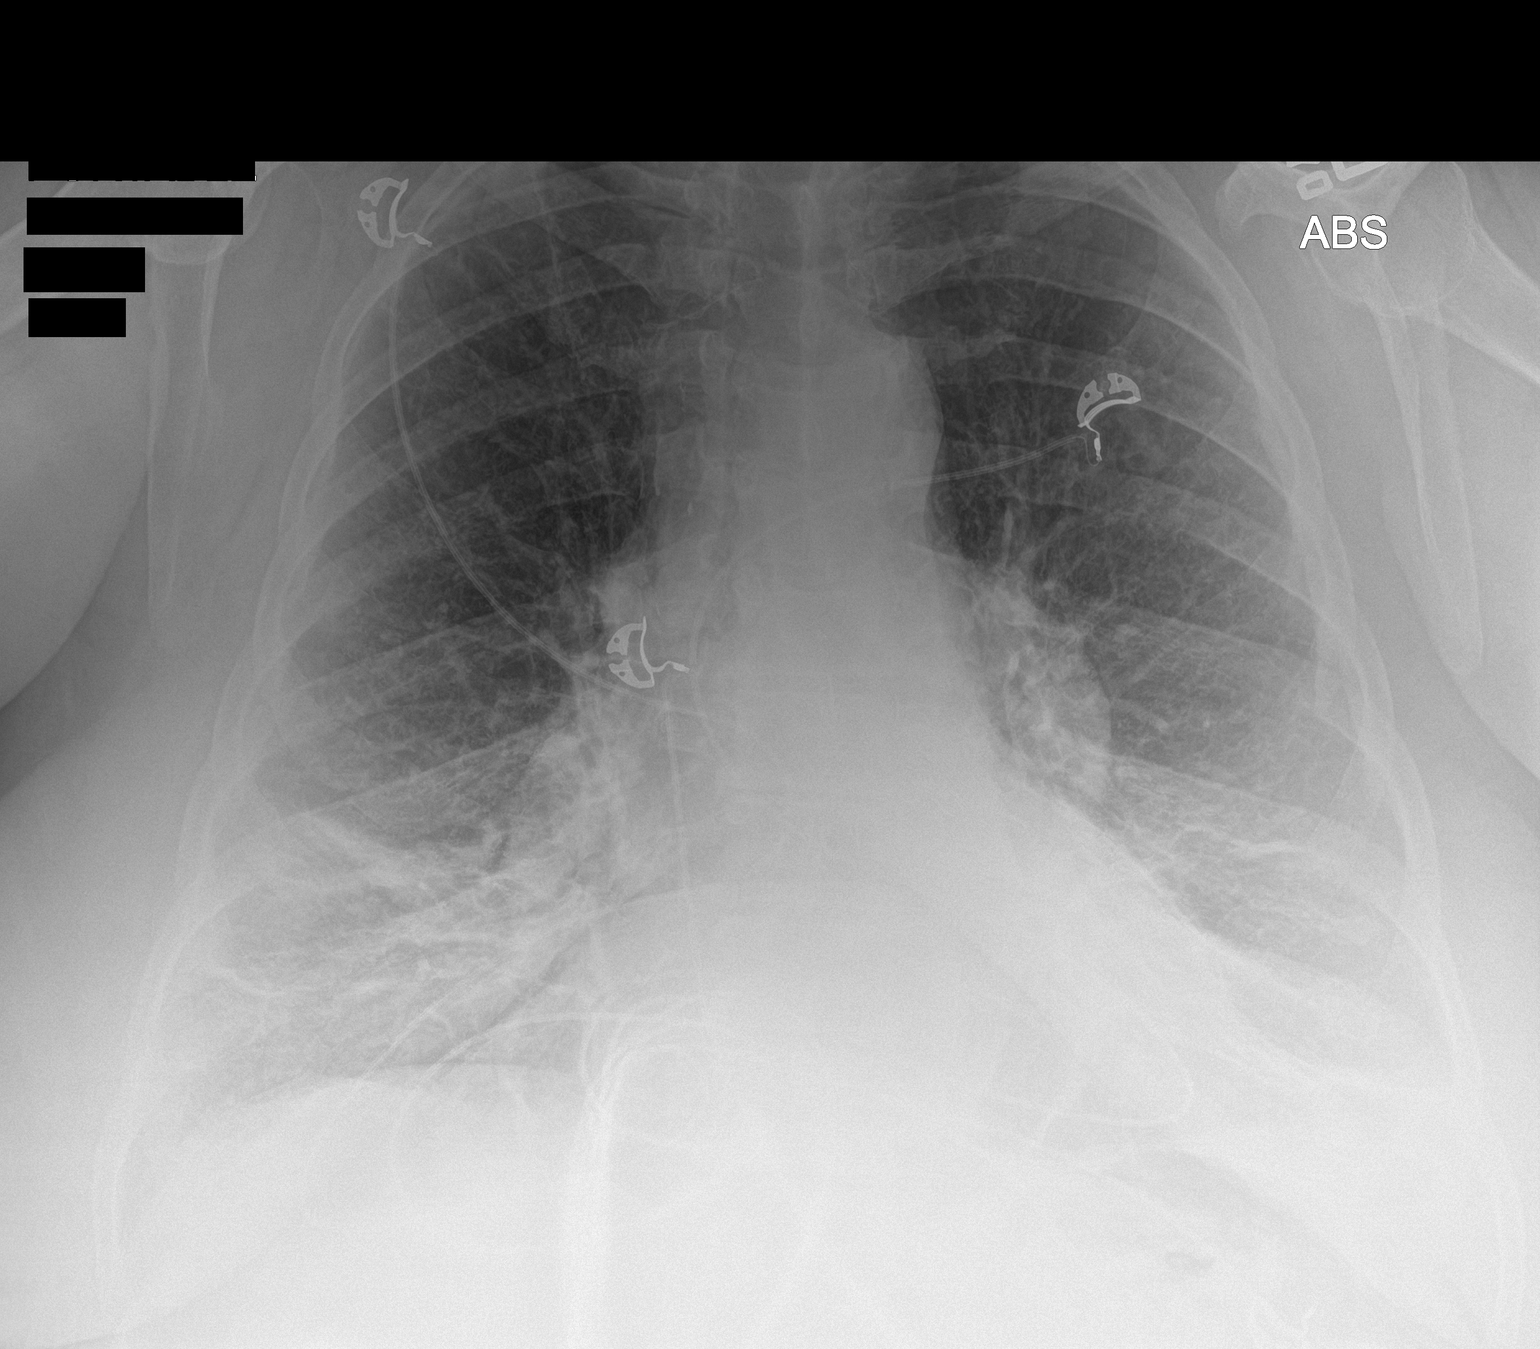

[1 of 1 positions shown; findings below may reference images not displayed]

FINDINGS: Stable cardiomediastinal silhouette. No pneumothorax is noted.
Mildly increased bibasilar opacities are noted, right greater than
left, concerning for atelectasis or possibly infiltrates. Bony
thorax is unremarkable.
IMPRESSION: Mildly increased bibasilar opacities are noted, concerning for
atelectasis or possibly infiltrates.

## 2017-05-23 ENCOUNTER — Ambulatory Visit
Admission: RE | Admit: 2017-05-23 | Discharge: 2017-05-23 | Disposition: A | Discharge status: Home / Self Care | Payer: BLUE CROSS/BLUE SHIELD | Source: Ambulatory Visit | Attending: Obstetrics and Gynecology | Admitting: Obstetrics and Gynecology

## 2017-05-23 DIAGNOSIS — Z1231 Encounter for screening mammogram for malignant neoplasm of breast: Secondary | ICD-10-CM | POA: Insufficient documentation

## 2017-05-23 DIAGNOSIS — M8589 Other specified disorders of bone density and structure, multiple sites: Secondary | ICD-10-CM | POA: Diagnosis not present

## 2017-05-23 DIAGNOSIS — Z13820 Encounter for screening for osteoporosis: Secondary | ICD-10-CM | POA: Insufficient documentation

## 2017-05-23 DIAGNOSIS — Z0000 Encounter for general adult medical examination without abnormal findings: Secondary | ICD-10-CM | POA: Diagnosis present

## 2017-05-26 ENCOUNTER — Other Ambulatory Visit: Payer: Self-pay | Admitting: Obstetrics and Gynecology

## 2017-05-26 DIAGNOSIS — R928 Other abnormal and inconclusive findings on diagnostic imaging of breast: Secondary | ICD-10-CM

## 2017-05-29 ENCOUNTER — Other Ambulatory Visit: Payer: Self-pay | Admitting: Obstetrics and Gynecology

## 2017-06-05 ENCOUNTER — Encounter: Payer: Self-pay | Admitting: Family

## 2017-06-05 ENCOUNTER — Ambulatory Visit: Payer: BLUE CROSS/BLUE SHIELD | Attending: Family | Admitting: Family

## 2017-06-05 VITALS — BP 110/65 | HR 75 | Resp 18 | Ht 63.0 in | Wt 235.0 lb

## 2017-06-05 DIAGNOSIS — I5032 Chronic diastolic (congestive) heart failure: Secondary | ICD-10-CM

## 2017-06-05 DIAGNOSIS — R1084 Generalized abdominal pain: Secondary | ICD-10-CM | POA: Diagnosis not present

## 2017-06-05 DIAGNOSIS — Z9981 Dependence on supplemental oxygen: Secondary | ICD-10-CM | POA: Diagnosis not present

## 2017-06-05 DIAGNOSIS — Z79899 Other long term (current) drug therapy: Secondary | ICD-10-CM | POA: Diagnosis not present

## 2017-06-05 DIAGNOSIS — J441 Chronic obstructive pulmonary disease with (acute) exacerbation: Secondary | ICD-10-CM | POA: Diagnosis not present

## 2017-06-05 DIAGNOSIS — M5030 Other cervical disc degeneration, unspecified cervical region: Secondary | ICD-10-CM | POA: Insufficient documentation

## 2017-06-05 DIAGNOSIS — Z9889 Other specified postprocedural states: Secondary | ICD-10-CM | POA: Insufficient documentation

## 2017-06-05 DIAGNOSIS — Z8249 Family history of ischemic heart disease and other diseases of the circulatory system: Secondary | ICD-10-CM | POA: Insufficient documentation

## 2017-06-05 DIAGNOSIS — M545 Low back pain: Secondary | ICD-10-CM | POA: Diagnosis not present

## 2017-06-05 DIAGNOSIS — Z801 Family history of malignant neoplasm of trachea, bronchus and lung: Secondary | ICD-10-CM | POA: Insufficient documentation

## 2017-06-05 DIAGNOSIS — G5600 Carpal tunnel syndrome, unspecified upper limb: Secondary | ICD-10-CM | POA: Diagnosis not present

## 2017-06-05 DIAGNOSIS — Z888 Allergy status to other drugs, medicaments and biological substances status: Secondary | ICD-10-CM | POA: Diagnosis not present

## 2017-06-05 DIAGNOSIS — E669 Obesity, unspecified: Secondary | ICD-10-CM | POA: Insufficient documentation

## 2017-06-05 DIAGNOSIS — R634 Abnormal weight loss: Secondary | ICD-10-CM | POA: Diagnosis not present

## 2017-06-05 DIAGNOSIS — Z720 Tobacco use: Secondary | ICD-10-CM

## 2017-06-05 DIAGNOSIS — F419 Anxiety disorder, unspecified: Secondary | ICD-10-CM | POA: Diagnosis not present

## 2017-06-05 DIAGNOSIS — Z634 Disappearance and death of family member: Secondary | ICD-10-CM

## 2017-06-05 DIAGNOSIS — I110 Hypertensive heart disease with heart failure: Secondary | ICD-10-CM | POA: Insufficient documentation

## 2017-06-05 DIAGNOSIS — F4321 Adjustment disorder with depressed mood: Secondary | ICD-10-CM

## 2017-06-05 DIAGNOSIS — Z90710 Acquired absence of both cervix and uterus: Secondary | ICD-10-CM | POA: Insufficient documentation

## 2017-06-05 DIAGNOSIS — G8929 Other chronic pain: Secondary | ICD-10-CM | POA: Diagnosis not present

## 2017-06-05 DIAGNOSIS — F329 Major depressive disorder, single episode, unspecified: Secondary | ICD-10-CM | POA: Diagnosis not present

## 2017-06-05 DIAGNOSIS — Z823 Family history of stroke: Secondary | ICD-10-CM | POA: Insufficient documentation

## 2017-06-05 DIAGNOSIS — Z87442 Personal history of urinary calculi: Secondary | ICD-10-CM | POA: Insufficient documentation

## 2017-06-05 DIAGNOSIS — Z7982 Long term (current) use of aspirin: Secondary | ICD-10-CM | POA: Diagnosis not present

## 2017-06-05 DIAGNOSIS — F17210 Nicotine dependence, cigarettes, uncomplicated: Secondary | ICD-10-CM | POA: Diagnosis not present

## 2017-06-05 DIAGNOSIS — Z811 Family history of alcohol abuse and dependence: Secondary | ICD-10-CM | POA: Insufficient documentation

## 2017-06-05 MED ORDER — PREDNISONE 10 MG (21) PO TBPK: ORAL_TABLET | ORAL | 0 refills | Status: DC

## 2017-06-05 MED ORDER — LEVOFLOXACIN 750 MG PO TABS: 750.0000 mg | ORAL_TABLET | Freq: Every day | ORAL | 0 refills | Status: DC

## 2017-06-05 NOTE — Progress Notes (Signed)
Patient ID: Theresa [name], female    DOB: May 12, 1956, 61 y.o.   MRN: [id number]  HPI Ms Sterba is a 61 y/o female with a history of chronic low back pain, HTN, depression, COPD on long-term oxygen, anxiety, carpal tunnel syndrome, long-standing tobacco use and chronic heart failure.  Last echo was done 11/03/15 but unable to access results. Echo done on 02/19/15 showed an EF of 50% without valvular stenosis. Recent PFT's done on 01/18/16  Admitted 02/13/17 due to COPD exacerbation. Initially needed bipap and transitioned back to 2L of oxygen. Given prednisone and antibiotics for 5 days at discharge. Psychiatry consult done due to daughter's death. Recently admitted on 11/01/16 due to COPD exacerbation. Initially treated with bipap and then baseline oxygen was resumed. Given IV steroids and discharged on oral prednisone. Was in the ED on 03/07/16 due to shoulder pain after a mechanical fall. Was treated as whiplash and was given mobic. Last admission was 11/02/15 due to COPD exacerbation.  She presents today for a follow-up visit with a chief complaint of cough that has been present for the last few weeks. She feels like the cough is worsening and she's now producing green/brown sputum with occasional blood tinge after she coughs very hard. She has associated fatigue, shortness of breath, chest tightness and wheezing along with this. Denies any dizziness, edema, weight gain or fever.   Past Medical History:  Diagnosis Date  . Anxiety   . Carpal tunnel syndrome   . CHF (congestive heart failure) ([place])   . CHF (congestive heart failure) ([place])    1/18  . Chronic generalized abdominal pain   . Chronic rhinitis   . COPD (chronic obstructive pulmonary disease) ([place])   . DDD (degenerative disc disease), cervical   . Depression   . Edema   . Flu    1/18  . Gastritis   . Hematuria   . Hypertension   . Kidney stones   . Low back pain   . Lumbar radiculitis   . Malodorous urine   . Muscle weakness   .  Obesity   . Renal cyst   . Sensory urge incontinence   . Tobacco abuse    Past Surgical History:  Procedure Laterality Date  . ABDOMINAL HYSTERECTOMY    . CARPAL TUNNEL RELEASE Left   . TONSILLECTOMY    . TUBAL LIGATION     Family History  Problem Relation Age of Onset  . Heart disease Mother   . Stroke Mother   . Coronary artery disease Mother   . Lung cancer Sister   . Alcohol abuse Father   . Heart disease Father   . Prostate cancer Neg Hx   . Kidney cancer Neg Hx   . Bladder Cancer Neg Hx    Social History  Substance Use Topics  . Smoking status: Current Every Day Smoker    Packs/day: 1.00    Years: 40.00    Types: Cigarettes  . Smokeless tobacco: Never Used     Comment: first day of patch patient states she's trying  . Alcohol use No   Allergies  Allergen Reactions  . Codeine Nausea And Vomiting  . Meloxicam Other (See Comments)    Stomach pain   Prior to Admission medications   Medication Sig Start Date End Date Taking? Authorizing Provider  albuterol (PROVENTIL HFA;VENTOLIN HFA) 108 (90 Base) MCG/ACT inhaler Inhale 2 puffs into the lungs every 6 (six) hours as needed for wheezing. Reported on 11/03/2015 11/02/16  Yes  Demetrios Loll, MD  ALPRAZolam Duanne Moron) 0.5 MG tablet Take 1 tablet (0.5 mg total) by mouth 2 (two) times daily as needed for anxiety. Patient taking differently: Take 0.25 mg by mouth 2 (two) times daily as needed for anxiety.  12/09/16  Yes Shiori Adcox A, FNP  Aspirin-Caffeine (BC FAST PAIN RELIEF ARTHRITIS) 1000-65 MG PACK Take 1 packet by mouth every 6 (six) hours as needed (Pain).   Yes [provider]  bisoprolol-hydrochlorothiazide [facility]) 10-6.25 MG tablet TAKE 1 TABLET BY MOUTH DAILY 12/16/16  Yes Chrismon, Vickki Muff, PA  Cholecalciferol ([facility] D3) 2000 units CAPS Take 1 capsule by mouth daily.   Yes [provider]  Fluticasone-Salmeterol (ADVAIR DISKUS) 250-50 MCG/DOSE AEPB INHALE 1 PUFF INTO LUNGS TWICE A DAY 05/23/16  Yes [provider]  furosemide (LASIX) 40 MG tablet Take 40 mg by mouth 2 (two) times daily.   Yes [provider]  HYDROcodone-acetaminophen (NORCO/VICODIN) 5-325 MG tablet Take 1 tablet by mouth every 8 (eight) hours as needed for severe pain. 05/17/17 06/16/17 Yes Vevelyn Francois, NP  HYDROcodone-acetaminophen (NORCO/VICODIN) 5-325 MG tablet Take 1 tablet by mouth every 8 (eight) hours as needed for severe pain. 06/16/17 07/16/17 Yes Vevelyn Francois, NP  HYDROcodone-acetaminophen (NORCO/VICODIN) 5-325 MG tablet Take 1 tablet by mouth every 8 (eight) hours as needed for severe pain. 07/16/17 08/15/17 Yes King, Diona Foley, NP  ipratropium-albuterol (DUONEB) 0.5-2.5 (3) MG/3ML SOLN Take 3 mLs by nebulization every 6 (six) hours as needed (shortness of breath). 11/02/16  Yes Demetrios Loll, MD  Menthol, Topical Analgesic, (BENGAY COLD THERAPY) 5 % GEL Apply 1 application topically daily.   Yes [provider]  nicotine polacrilex ([employer] NICOTINE POLACRILEX) 2 MG gum Take 1 each (2 mg total) by mouth as needed for smoking cessation. 02/16/17  Yes Mody, Ulice Bold, MD  OXYGEN Inhale 2.5 L into the lungs continuous. Reported on 11/03/2015   Yes [provider]  potassium chloride SA (K-DUR,KLOR-CON) 20 MEQ tablet Take 20 mEq by mouth 2 (two) times daily.   Yes [provider]  ranitidine (ZANTAC) 150 MG tablet Take 150 mg by mouth 2 (two) times daily.   Yes [provider]  roflumilast (DALIRESP) 500 MCG TABS tablet Take 500 mcg by mouth daily.   Yes [provider]  sertraline (ZOLOFT) 100 MG tablet TAKE 1.5 TABLET (150 MG TOTAL) BY MOUTH DAILY. 02/23/17  Yes Chrismon, Vickki Muff, PA    Review of Systems  Constitutional: Positive for fatigue. Negative for appetite change and fever.  HENT: Negative for congestion, postnasal drip and sore throat.   Eyes: Negative.   Respiratory: Positive for cough (green/brown sputum), chest tightness, shortness of breath and wheezing.    Cardiovascular: Negative for chest pain, palpitations and leg swelling.  Gastrointestinal: Negative for abdominal distention, abdominal pain and nausea.  Endocrine: Negative.   Genitourinary: Negative.   Musculoskeletal: Positive for back pain. Negative for neck pain.  Skin: Negative.   Allergic/Immunologic: Negative.   Neurological: Negative for dizziness and light-headedness.  Hematological: Negative for adenopathy. Does not bruise/bleed easily.  Psychiatric/Behavioral: Positive for dysphoric mood and sleep disturbance (wearing oxygen & bipap at night). Negative for agitation and suicidal ideas. The patient is nervous/anxious.    Vitals:   06/05/17 1414  BP: 110/65  Pulse: 75  Resp: 18  SpO2: 94%  Weight: 235 lb (106.6 kg)  Height: _0  (1.6 m)   Wt Readings from Last 3 Encounters:  06/05/17 235 lb (106.6 kg)  05/11/17 237 lb (107.5 kg)  05/04/17 245 lb 6 oz (111.3 kg)    Lab Results  Component Value Date   CREATININE 0.79 03/02/2017   CREATININE 0.73 02/14/2017   CREATININE 0.81 02/13/2017   Physical Exam  Constitutional: She is oriented to person, place, and time. She appears well-developed and well-nourished.  HENT:  Head: Normocephalic and atraumatic.  Mouth/Throat: Posterior oropharyngeal erythema present.  Neck: Normal range of motion. Neck supple. No JVD present.  Cardiovascular: Normal rate and regular rhythm.   Pulmonary/Chest: Effort normal. She has wheezes in the right lower field and the left lower field. She has no rales.  Abdominal: Soft. She exhibits no distension. There is no tenderness.  Musculoskeletal: She exhibits no edema or tenderness.  Neurological: She is alert and oriented to person, place, and time.  Skin: Skin is warm and dry.  Psychiatric: Her behavior is normal. Thought content normal. Her mood appears anxious. She exhibits a depressed mood.  tearful  Nursing note and vitals reviewed.     Assessment & Plan:  1: Chronic heart failure  with preserved ejection fraction- - NYHA class III - euvolemic today - continues to weigh daily at home and has noticed a gradual weight loss. Reminded to call for an overnight weight gain of >2 pounds or a weekly weight gain of >5 pounds - by our scale, she has lost 10 pounds since she was last here; says that she's decreased soda consumption and is drinking more water - continues to take 72m furosemide BID along with 264m potassium BID - BMP from 03/02/17 reviewed; potassium 4.6, sodium 141 and GFR 82 - monitor sodium content carefully - saw cardiologist (CClayborn Bigness5/23/18  - did not fit criteria for REDS vest reading  2: COPD exacerbation- - wearing bipap nightly - oxygen at 2.5L around the clock - last saw pulmonology (FRaul Delon 03/15/17 - will treat with levaquin [id number] one week along with prednisone taper  3: Tobacco use- - alternates between the patch, gum and actual cigarettes; doesn't smoke while wearing the patch - removes herself from the oxygen when smoking - complete cessation discussed for 3 minutes with her  4: Reactive depression- - daughter suddenly died 4/804/20/2018d patient admits that she's depressed  - continues living at her daughter's home but says that as difficult as it is to live there, she feels closer to her by living there - has an appointment with psychiatry (KaNicolasa Duckingeptember 2018 - emotional support given; explained that grief can come in waves and exacerbations can occur without any reason  Patient did not bring her medications nor a list. Each medication was verbally reviewed with the patient and she was encouraged to bring the bottles to every visit to confirm accuracy of list.  Return here in 2 months or sooner for any questions/problems before then

## 2017-06-05 NOTE — Patient Instructions (Addendum)
Continue weighing daily and call for an overnight weight gain of > 2 pounds or a weekly weight gain of >5 pounds.    Smoking Cessation Quitting smoking is important to your health and has many advantages. However, it is not always easy to quit since nicotine is a very addictive drug. Oftentimes, people try 3 times or more before being able to quit. This document explains the best ways for you to prepare to quit smoking. Quitting takes hard work and a lot of effort, but you can do it. ADVANTAGES OF QUITTING SMOKING  You will live longer, feel better, and live better.  Your body will feel the impact of quitting smoking almost immediately.  Within 20 minutes, blood pressure decreases. Your pulse returns to its normal level.  After 8 hours, carbon monoxide levels in the blood return to normal. Your oxygen level increases.  After 24 hours, the chance of having a heart attack starts to decrease. Your breath, hair, and body stop smelling like smoke.  After 48 hours, damaged nerve endings begin to recover. Your sense of taste and smell improve.  After 72 hours, the body is virtually free of nicotine. Your bronchial tubes relax and breathing becomes easier.  After 2 to 12 weeks, lungs can hold more air. Exercise becomes easier and circulation improves.  The risk of having a heart attack, stroke, cancer, or lung disease is greatly reduced.  After 1 year, the risk of coronary heart disease is cut in half.  After 5 years, the risk of stroke falls to the same as a nonsmoker.  After 10 years, the risk of lung cancer is cut in half and the risk of other cancers decreases significantly.  After 15 years, the risk of coronary heart disease drops, usually to the level of a nonsmoker.  If you are pregnant, quitting smoking will improve your chances of having a healthy baby.  The people you live with, especially any children, will be healthier.  You will have extra money to spend on things other  than cigarettes. QUESTIONS TO THINK ABOUT BEFORE ATTEMPTING TO QUIT You may want to talk about your answers with your health care provider.  Why do you want to quit?  If you tried to quit in the past, what helped and what did not?  What will be the most difficult situations for you after you quit? How will you plan to handle them?  Who can help you through the tough times? Your family? Friends? A health care provider?  What pleasures do you get from smoking? What ways can you still get pleasure if you quit? Here are some questions to ask your health care provider:  How can you help me to be successful at quitting?  What medicine do you think would be best for me and how should I take it?  What should I do if I need more help?  What is smoking withdrawal like? How can I get information on withdrawal? GET READY  Set a quit date.  Change your environment by getting rid of all cigarettes, ashtrays, matches, and lighters in your home, car, or work. Do not let people smoke in your home.  Review your past attempts to quit. Think about what worked and what did not. GET SUPPORT AND ENCOURAGEMENT You have a better chance of being successful if you have help. You can get support in many ways.  Tell your family, friends, and coworkers that you are going to quit and need their support. Ask  them not to smoke around you.  Get individual, group, or telephone counseling and support. Programs are available at [employer] and health centers. Call your local health department for information about programs in your area.  Spiritual beliefs and practices may help some smokers quit.  Download a "quit meter" on your computer to keep track of quit statistics, such as how long you have gone without smoking, cigarettes not smoked, and money saved.  Get a self-help book about quitting smoking and staying off tobacco. [place] yourself from urges to smoke. Talk to  someone, go for a walk, or occupy your time with a task.  Change your normal routine. Take a different route to work. Drink tea instead of coffee. Eat breakfast in a different place.  Reduce your stress. Take a hot bath, exercise, or read a book.  Plan something enjoyable to do every day. Reward yourself for not smoking.  Explore interactive web-based programs that specialize in helping you quit. GET MEDICINE AND USE IT CORRECTLY Medicines can help you stop smoking and decrease the urge to smoke. Combining medicine with the above behavioral methods and support can greatly increase your chances of successfully quitting smoking.  Nicotine replacement therapy helps deliver nicotine to your body without the negative effects and risks of smoking. Nicotine replacement therapy includes nicotine gum, lozenges, inhalers, nasal sprays, and skin patches. Some may be available over-the-counter and others require a prescription.  Antidepressant medicine helps people abstain from smoking, but how this works is unknown. This medicine is available by prescription.  Nicotinic receptor partial agonist medicine simulates the effect of nicotine in your brain. This medicine is available by prescription. Ask your health care provider for advice about which medicines to use and how to use them based on your health history. Your health care provider will tell you what side effects to look out for if you choose to be on a medicine or therapy. Carefully read the information on the package. Do not use any other product containing nicotine while using a nicotine replacement product.  RELAPSE OR DIFFICULT SITUATIONS Most relapses occur within the first 3 months after quitting. Do not be discouraged if you start smoking again. Remember, most people try several times before finally quitting. You may have symptoms of withdrawal because your body is used to nicotine. You may crave cigarettes, be irritable, feel very hungry, cough  often, get headaches, or have difficulty concentrating. The withdrawal symptoms are only temporary. They are strongest when you first quit, but they will go away within 10-14 days. To reduce the chances of relapse, try to:  Avoid drinking alcohol. Drinking lowers your chances of successfully quitting.  Reduce the amount of caffeine you consume. Once you quit smoking, the amount of caffeine in your body increases and can give you symptoms, such as a rapid heartbeat, sweating, and anxiety.  Avoid smokers because they can make you want to smoke.  Do not let weight gain distract you. Many smokers will gain weight when they quit, usually less than 10 pounds. Eat a healthy diet and stay active. You can always lose the weight gained after you quit.  Find ways to improve your mood other than smoking. FOR MORE INFORMATION  www.smokefree.gov  Document Released: 09/27/2001 Document Revised: 02/17/2014 Document Reviewed: 01/12/2012 [facility] Patient Information 2015 [place], [place]. This information is not intended to replace advice given to you by your health care provider. Make sure you discuss any questions you have with your  health care provider.

## 2017-06-06 ENCOUNTER — Ambulatory Visit: Payer: BLUE CROSS/BLUE SHIELD | Admitting: Pain Medicine

## 2017-06-08 ENCOUNTER — Ambulatory Visit
Admission: RE | Admit: 2017-06-08 | Discharge: 2017-06-08 | Disposition: A | Discharge status: Home / Self Care | Payer: BLUE CROSS/BLUE SHIELD | Source: Ambulatory Visit | Attending: Obstetrics and Gynecology | Admitting: Obstetrics and Gynecology

## 2017-06-08 DIAGNOSIS — R928 Other abnormal and inconclusive findings on diagnostic imaging of breast: Secondary | ICD-10-CM

## 2017-06-13 IMAGING — DX DG CHEST 1V
1 series · 1 of 1 positions shown · non-contrast
Comparison: Portable chest x-ray February 14, 2017

CLINICAL DATA: Two days of nonproductive cough with onset of
shortness of breath last night. History of CHF, COPD, current
smoker.

EXAM:
CHEST 1 VIEW

[chest ap]
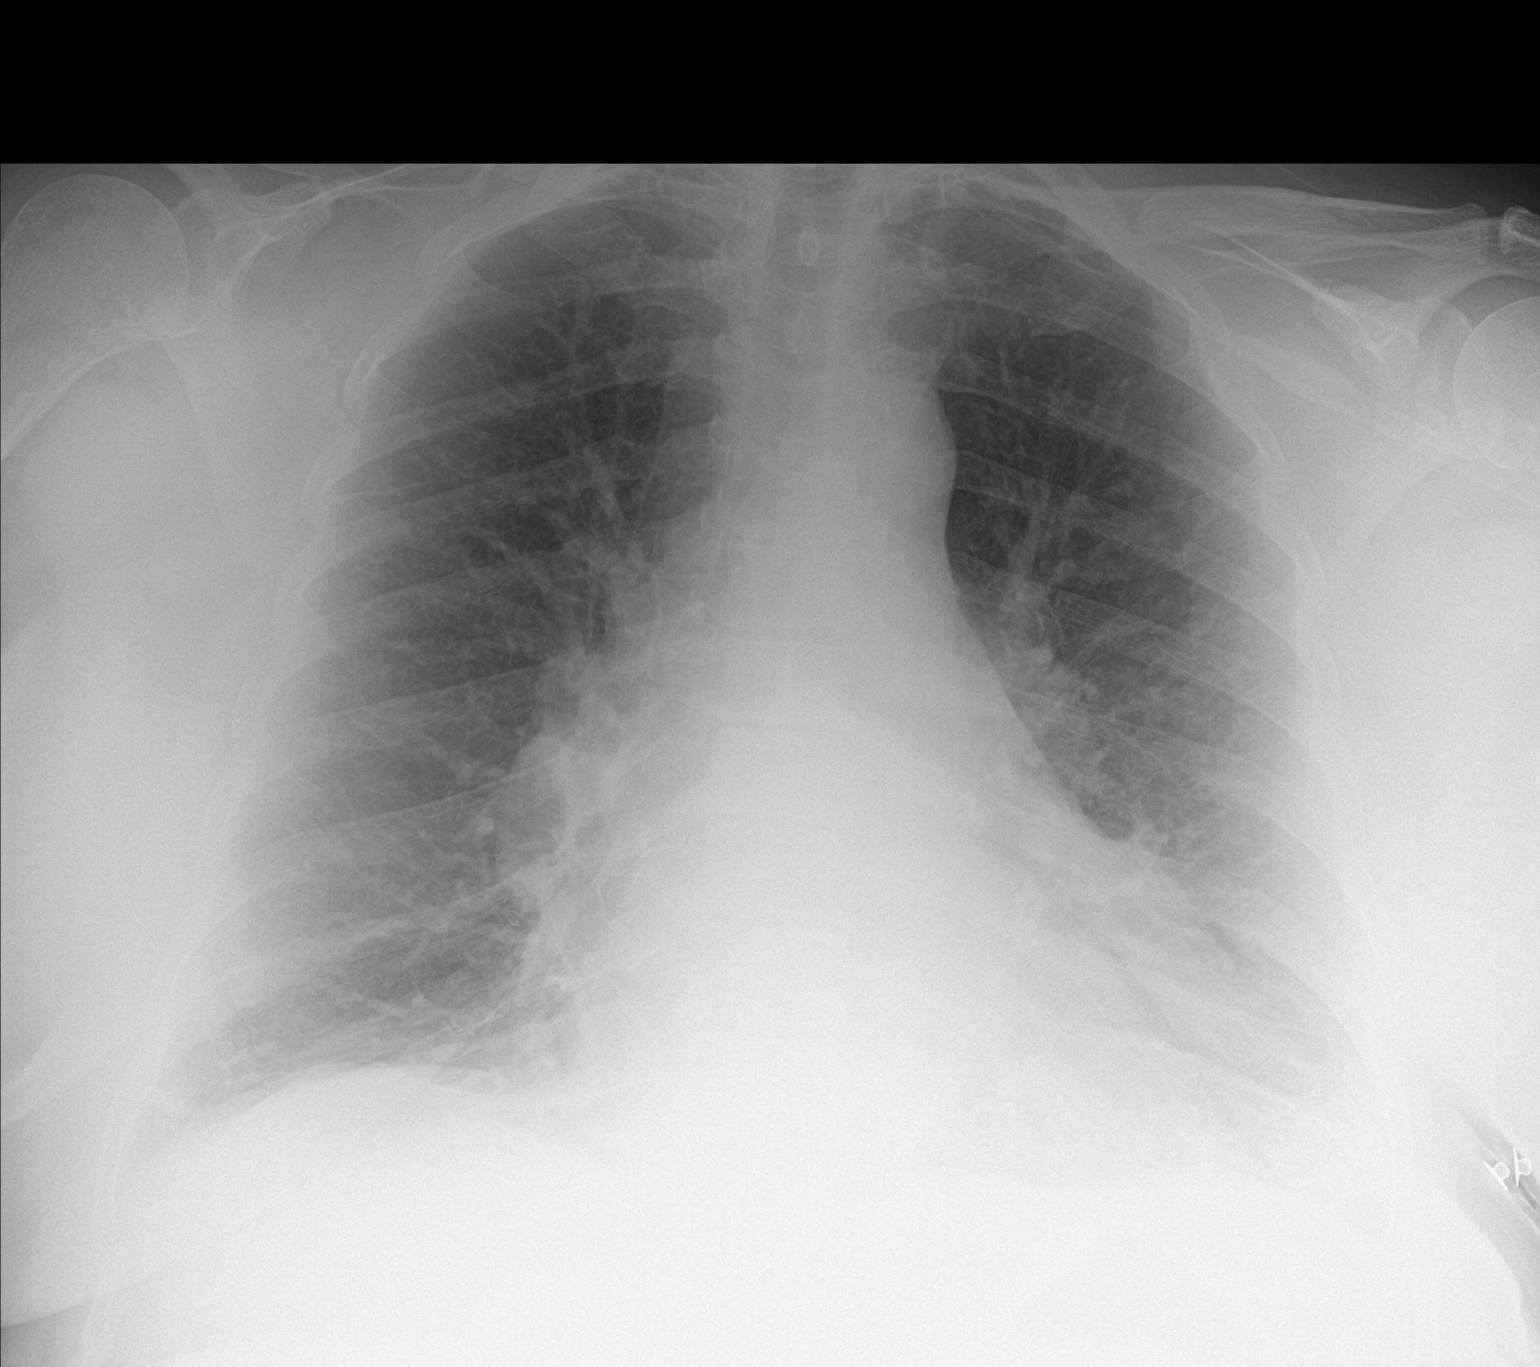

[1 of 1 positions shown; findings below may reference images not displayed]

FINDINGS: The lungs are mildly hyperinflated. The lung markings are coarse in
the left lower lung. The cardiac silhouette is enlarged. The
pulmonary vascularity is engorged. The mediastinum is normal in
width. There is no pleural effusion.
IMPRESSION: COPD. Superimposed mild CHF. There may be subsegmental atelectasis
or early infiltrate in the left lower lobe though I favor the
former. When the patient can tolerate the procedure, a PA and
lateral chest x-ray would be useful.

## 2017-06-15 ENCOUNTER — Ambulatory Visit: Payer: Medicare PPO | Admitting: Pain Medicine

## 2017-06-15 DIAGNOSIS — M25511 Pain in right shoulder: Secondary | ICD-10-CM | POA: Insufficient documentation

## 2017-06-15 DIAGNOSIS — M19011 Primary osteoarthritis, right shoulder: Secondary | ICD-10-CM | POA: Insufficient documentation

## 2017-06-27 ENCOUNTER — Ambulatory Visit (HOSPITAL_BASED_OUTPATIENT_CLINIC_OR_DEPARTMENT_OTHER): Payer: Medicare PPO | Admitting: Pain Medicine

## 2017-06-27 ENCOUNTER — Ambulatory Visit
Admission: RE | Admit: 2017-06-27 | Discharge: 2017-06-27 | Disposition: A | Discharge status: Home / Self Care | Payer: Medicare PPO | Source: Ambulatory Visit | Attending: Pain Medicine | Admitting: Pain Medicine

## 2017-06-27 ENCOUNTER — Encounter: Payer: Self-pay | Admitting: Pain Medicine

## 2017-06-27 VITALS — BP 111/75 | HR 66 | Temp 96.7°F | Resp 15 | Ht 62.0 in | Wt 233.0 lb

## 2017-06-27 DIAGNOSIS — M4696 Unspecified inflammatory spondylopathy, lumbar region: Secondary | ICD-10-CM | POA: Insufficient documentation

## 2017-06-27 DIAGNOSIS — M545 Low back pain, unspecified: Secondary | ICD-10-CM

## 2017-06-27 DIAGNOSIS — G8929 Other chronic pain: Secondary | ICD-10-CM | POA: Diagnosis not present

## 2017-06-27 DIAGNOSIS — Z885 Allergy status to narcotic agent status: Secondary | ICD-10-CM | POA: Diagnosis not present

## 2017-06-27 DIAGNOSIS — G8918 Other acute postprocedural pain: Secondary | ICD-10-CM

## 2017-06-27 DIAGNOSIS — Z888 Allergy status to other drugs, medicaments and biological substances status: Secondary | ICD-10-CM | POA: Insufficient documentation

## 2017-06-27 DIAGNOSIS — J449 Chronic obstructive pulmonary disease, unspecified: Secondary | ICD-10-CM | POA: Insufficient documentation

## 2017-06-27 DIAGNOSIS — M79604 Pain in right leg: Secondary | ICD-10-CM | POA: Diagnosis not present

## 2017-06-27 DIAGNOSIS — M47896 Other spondylosis, lumbar region: Secondary | ICD-10-CM | POA: Insufficient documentation

## 2017-06-27 DIAGNOSIS — M47816 Spondylosis without myelopathy or radiculopathy, lumbar region: Secondary | ICD-10-CM | POA: Insufficient documentation

## 2017-06-27 DIAGNOSIS — Z79899 Other long term (current) drug therapy: Secondary | ICD-10-CM | POA: Diagnosis not present

## 2017-06-27 DIAGNOSIS — M79601 Pain in right arm: Secondary | ICD-10-CM | POA: Diagnosis not present

## 2017-06-27 DIAGNOSIS — M549 Dorsalgia, unspecified: Secondary | ICD-10-CM | POA: Insufficient documentation

## 2017-06-27 DIAGNOSIS — R0902 Hypoxemia: Secondary | ICD-10-CM

## 2017-06-27 MED ORDER — TRIAMCINOLONE ACETONIDE 40 MG/ML IJ SUSP
40.0000 mg | Freq: Once | INTRAMUSCULAR | Status: AC
  Administered 2017-06-27: 40 mg

## 2017-06-27 MED ORDER — FENTANYL CITRATE (PF) 100 MCG/2ML IJ SOLN
25.0000 ug | INTRAMUSCULAR | Status: DC | PRN
  Administered 2017-06-27: 100 ug via INTRAVENOUS

## 2017-06-27 MED ORDER — MIDAZOLAM HCL 5 MG/5ML IJ SOLN
1.0000 mg | INTRAMUSCULAR | Status: DC | PRN
Start: 2017-06-27 — End: 2017-06-27
  Administered 2017-06-27: 2 mg via INTRAVENOUS

## 2017-06-27 MED ORDER — ROPIVACAINE HCL 2 MG/ML IJ SOLN
9.0000 mL | Freq: Once | INTRAMUSCULAR | Status: AC
  Administered 2017-06-27: 10 mL via PERINEURAL

## 2017-06-27 MED ORDER — LIDOCAINE HCL 2 % IJ SOLN
10.0000 mL | Freq: Once | INTRAMUSCULAR | Status: AC
  Administered 2017-06-27: 200 mg

## 2017-06-27 MED ORDER — FENTANYL CITRATE (PF) 100 MCG/2ML IJ SOLN
INTRAMUSCULAR | Status: AC
  Filled 2017-06-27: qty 2

## 2017-06-27 MED ORDER — TRIAMCINOLONE ACETONIDE 40 MG/ML IJ SUSP
INTRAMUSCULAR | Status: AC
  Filled 2017-06-27: qty 1

## 2017-06-27 MED ORDER — MIDAZOLAM HCL 5 MG/5ML IJ SOLN
INTRAMUSCULAR | Status: AC
  Filled 2017-06-27: qty 5

## 2017-06-27 MED ORDER — LACTATED RINGERS IV SOLN
1000.0000 mL | Freq: Once | INTRAVENOUS | Status: AC
  Administered 2017-06-27: 1000 mL via INTRAVENOUS

## 2017-06-27 MED ORDER — LIDOCAINE HCL (PF) 2 % IJ SOLN
INTRAMUSCULAR | Status: AC
  Filled 2017-06-27: qty 10

## 2017-06-27 MED ORDER — OXYCODONE HCL 5 MG PO TABS: 5.0000 mg | ORAL_TABLET | Freq: Three times a day (TID) | ORAL | 0 refills | Status: DC | PRN

## 2017-06-27 MED ORDER — ROPIVACAINE HCL 2 MG/ML IJ SOLN
INTRAMUSCULAR | Status: AC
  Filled 2017-06-27: qty 10

## 2017-06-27 NOTE — Progress Notes (Signed)
Safety precautions to be maintained throughout the outpatient stay will include: orient to surroundings, keep bed in low position, maintain call bell within reach at all times, provide assistance with transfer out of bed and ambulation.

## 2017-06-27 NOTE — Patient Instructions (Addendum)
____________________________________________________________________________________________  Post-Procedure instructions Instructions:  Apply ice: Fill a plastic sandwich bag with crushed ice. Cover it with a small towel and apply to injection site. Apply for 15 minutes then remove x 15 minutes. Repeat sequence on day of procedure, until you go to bed. The purpose is to minimize swelling and discomfort after procedure.  Apply heat: Apply heat to procedure site starting the day following the procedure. The purpose is to treat any soreness and discomfort from the procedure.  Food intake: Start with clear liquids (like water) and advance to regular food, as tolerated.   Physical activities: Keep activities to a minimum for the first 8 hours after the procedure.   Driving: If you have received any sedation, you are not allowed to drive for 24 hours after your procedure.  Blood thinner: Restart your blood thinner 6 hours after your procedure. (Only for those taking blood thinners)  Insulin: As soon as you can eat, you may resume your normal dosing schedule. (Only for those taking insulin)  Infection prevention: Keep procedure site clean and dry.  Post-procedure Pain Diary: Extremely important that this be done correctly and accurately. Recorded information will be used to determine the next step in treatment.  Pain evaluated is that of treated area only. Do not include pain from an untreated area.  Complete every hour, on the hour, for the initial 8 hours. Set an alarm to help you do this part accurately.  Do not go to sleep and have it completed later. It will not be accurate.  Follow-up appointment: Keep your follow-up appointment after the procedure. Usually 2 weeks for most procedures. (6 weeks in the case of radiofrequency.) Bring you pain diary.  Expect:  From numbing medicine (AKA: Local Anesthetics): Numbness or decrease in pain.  Onset: Full effect within 15 minutes of  injected.  Duration: It will depend on the type of local anesthetic used. On the average, 1 to 8 hours.   From steroids: Decrease in swelling or inflammation. Once inflammation is improved, relief of the pain will follow.  Onset of benefits: Depends on the amount of swelling present. The more swelling, the longer it will take for the benefits to be seen. In some cases, up to 10 days.  Duration: Steroids will stay in the system x 2 weeks. Duration of benefits will depend on multiple posibilities including persistent irritating factors.  From procedure: Some discomfort is to be expected once the numbing medicine wears off. This should be minimal if ice and heat are applied as instructed. Call if:  You experience numbness and weakness that gets worse with time, as opposed to wearing off.  New onset bowel or bladder incontinence. (Spinal procedures only)  Emergency Numbers:  Durning business hours (Monday - Thursday, 8:00 AM - 4:00 PM) (Friday, 9:00 AM - 12:00 Noon): [phone]  After hours: [phone] ____________________________________________________________________________________________  Radiofrequency Lesioning Radiofrequency lesioning is a procedure that is performed to relieve pain. The procedure is often used for back, neck, or arm pain. Radiofrequency lesioning involves the use of a machine that creates radio waves to make heat. During the procedure, the heat is applied to the nerve that carries the pain signal. The heat damages the nerve and interferes with the pain signal. Pain relief usually starts about 2 weeks after the procedure and lasts for 6 months to 1 year. Tell a health care provider about:  Any allergies you have.  All medicines you are taking, including vitamins, herbs, eye drops, creams, and  over-the-counter medicines.  Any problems you or family members have had with anesthetic medicines.  Any blood disorders you have.  Any surgeries you have  had.  Any medical conditions you have.  Whether you are pregnant or may be pregnant. What are the risks? Generally, this is a safe procedure. However, problems may occur, including:  Pain or soreness at the injection site.  Infection at the injection site.  Damage to nerves or blood vessels.  What happens before the procedure?  Ask your health care provider about: ? Changing or stopping your regular medicines. This is especially important if you are taking diabetes medicines or blood thinners. ? Taking medicines such as aspirin and ibuprofen. These medicines can thin your blood. Do not take these medicines before your procedure if your health care provider instructs you not to.  Follow instructions from your health care provider about eating or drinking restrictions.  Plan to have someone take you home after the procedure.  If you go home right after the procedure, plan to have someone with you for 24 hours. What happens during the procedure?  You will be given one or more of the following: ? A medicine to help you relax (sedative). ? A medicine to numb the area (local anesthetic).  You will be awake during the procedure. You will need to be able to talk with the health care provider during the procedure.  With the help of a type of X-ray (fluoroscopy), the health care provider will insert a radiofrequency needle into the area to be treated.  Next, a wire that carries the radio waves (electrode) will be put through the radiofrequency needle. An electrical pulse will be sent through the electrode to verify the correct nerve. You will feel a tingling sensation, and you may have muscle twitching.  Then, the tissue that is around the needle tip will be heated by an electric current that is passed using the radiofrequency machine. This will numb the nerves.  A bandage (dressing) will be put on the insertion area after the procedure is done. The procedure may vary among health care  providers and hospitals. What happens after the procedure?  Your blood pressure, heart rate, breathing rate, and blood oxygen level will be monitored often until the medicines you were given have worn off.  Return to your normal activities as directed by your health care provider. This information is not intended to replace advice given to you by your health care provider. Make sure you discuss any questions you have with your health care provider. Document Released: 06/01/2011 Document Revised: 03/10/2016 Document Reviewed: 11/10/2014 Elsevier Interactive Patient Education  2018 [place] Radiofrequency Lesioning, Care After Refer to this sheet in the next few weeks. These instructions provide you with information about caring for yourself after your procedure. Your health care provider may also give you more specific instructions. Your treatment has been planned according to current medical practices, but problems sometimes occur. Call your health care provider if you have any problems or questions after your procedure. What can I expect after the procedure? After the procedure, it is common to have:  Pain from the burned nerve.  Temporary numbness.  Follow these instructions at home:  Take over-the-counter and prescription medicines only as told by your health care provider.  Return to your normal activities as told by your health care provider. Ask your health care provider what activities are safe for you.  Pay close attention to how you feel after the procedure. If  you start to have pain, write down when it hurts and how it feels. This will help you and your health care provider to know if you need an additional treatment.  Check your needle insertion site every day for signs of infection. Watch for: ? Redness, swelling, or pain. ? Fluid, blood, or pus.  Keep all follow-up visits as told by your health care provider. This is important. Contact a health care provider  if:  Your pain does not get better.  You have redness, swelling, or pain at the needle insertion site.  You have fluid, blood, or pus coming from the needle insertion site.  You have a fever. Get help right away if:  You develop sudden, severe pain.  You develop numbness or tingling near the procedure site that does not go away. This information is not intended to replace advice given to you by your health care provider. Make sure you discuss any questions you have with your health care provider. Document Released: 06/02/2011 Document Revised: 03/10/2016 Document Reviewed: 11/10/2014 Elsevier Interactive Patient Education  2018 [place]. Pain Management Discharge Instructions  General Discharge Instructions :  If you need to reach your doctor call: Monday-Friday 8:00 am - 4:00 pm at [phone] or toll free [phone].  After clinic hours [phone] to have operator reach doctor.  Bring all of your medication bottles to all your appointments in the pain clinic.  To cancel or reschedule your appointment with Pain Management please remember to call 24 hours in advance to avoid a fee.  Refer to the educational materials which you have been given on: General Risks, I had my Procedure. Discharge Instructions, Post Sedation.  Post Procedure Instructions:  The drugs you were given will stay in your system until tomorrow, so for the next 24 hours you should not drive, make any legal decisions or drink any alcoholic beverages.  You may eat anything you prefer, but it is better to start with liquids then soups and crackers, and gradually work up to solid foods.  Please notify your doctor immediately if you have any unusual bleeding, trouble breathing or pain that is not related to your normal pain.  Depending on the type of procedure that was done, some parts of your body may feel week and/or numb.  This usually clears up by tonight or the next day.  Walk with the use of an  assistive device or accompanied by an adult for the 24 hours.  You may use ice on the affected area for the first 24 hours.  Put ice in a Ziploc bag and cover with a towel and place against area 15 minutes on 15 minutes off.  You may switch to heat after 24 hours.

## 2017-06-27 NOTE — Progress Notes (Signed)
Patient's Name: Theresa [name]  MRN: [id number]  Referring Provider: Milinda Pointer, MD  DOB: [birth year]  PCP: Margo Common, PA  DOS: 06/27/2017  Note by: Gaspar Cola, MD  Service setting: Ambulatory outpatient  Specialty: Interventional Pain Management  Patient type: Established  Location: [facility] (AMB) Pain Management Facility  Visit type: Interventional Procedure   Primary Reason for Visit: Interventional Pain Management Treatment. CC: Back Pain (low); Leg Pain (right); and Arm Pain (right)  Procedure:  Anesthesia, Analgesia, Anxiolysis:  Type: Therapeutic Medial Branch Facet Radiofrequency Ablation Region: Lumbar Level: L2, L3, L4, L5, & S1 Medial Branch Level(s) Laterality: Left-Sided  Type: Local Anesthesia with Moderate (Conscious) Sedation Local Anesthetic: Lidocaine 1% Route: Intravenous (IV) IV Access: Secured Sedation: Meaningful verbal contact was maintained at all times during the procedure  Indication(s): Analgesia and Anxiety  Indications: 1. Lumbar facet syndrome (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)   2. Lumbar facet hypertrophy (multilevel)   3. Lumbar spondylosis (L4-5)   4. Chronic low back pain (Location of Primary Source of Pain) (Left)    [name] has either failed to respond, was unable to tolerate, or simply did not get enough benefit from other more conservative therapies including, but not limited to: 1. Over-the-counter medications 2. Anti-inflammatory medications 3. Muscle relaxants 4. Membrane stabilizers 5. Opioids 6. Physical therapy 7. Modalities (Heat, ice, etc.) 8. Invasive techniques such as nerve blocks. [name] has attained more than 50% relief of the pain from a series of diagnostic injections conducted in separate occasions.  Pain Score: Pre-procedure: 5 /10 Post-procedure: 0-No pain/10  Pre-op Assessment:  [name] is a 61 y.o. (year old), female patient, seen today for interventional treatment. She  has a past surgical history that  includes Tubal ligation; Abdominal hysterectomy; Tonsillectomy; and Carpal tunnel release (Left). [name] has a current medication list which includes the following prescription(s): albuterol, albuterol, alprazolam, aspirin-caffeine, bisoprolol-hydrochlorothiazide, cholecalciferol, fluticasone-salmeterol, furosemide, hydrocodone-acetaminophen, hydrocodone-acetaminophen, ipratropium-albuterol, menthol (topical analgesic), nicotine polacrilex, oxygen-helium, potassium chloride sa, ranitidine, roflumilast, sertraline, trazodone, hydrocodone-acetaminophen, and oxycodone, and the following Facility-Administered Medications: fentanyl and midazolam. Her primarily concern today is the Back Pain (low); Leg Pain (right); and Arm Pain (right)  Initial Vital Signs: Blood pressure 102/67, pulse 66, temperature 98 F (36.7 C), resp. rate 16, height _0  (1.575 m), weight 233 lb (105.7 kg), SpO2 98 %. BMI: Estimated body mass index is 42.62 kg/m as calculated from the following:   Height as of this encounter: _1  (1.575 m).   Weight as of this encounter: 233 lb (105.7 kg).  Risk Assessment: Allergies: Reviewed. She is allergic to codeine and meloxicam.  Allergy Precautions: None required Coagulopathies: Reviewed. None identified.  Blood-thinner therapy: None at this time Active Infection(s): Reviewed. None identified. [name] is afebrile  Site Confirmation: [name] was asked to confirm the procedure and laterality before marking the site Procedure checklist: Completed Consent: Before the procedure and under the influence of no sedative(s), amnesic(s), or anxiolytics, the patient was informed of the treatment options, risks and possible complications. To fulfill our ethical and legal obligations, as recommended by the American Medical Association's Code of Ethics, I have informed the patient of my clinical impression; the nature and purpose of the treatment or procedure; the risks, benefits, and  possible complications of the intervention; the alternatives, including doing nothing; the risk(s) and benefit(s) of the alternative treatment(s) or procedure(s); and the risk(s) and benefit(s) of doing nothing. The patient was provided information about the general risks and possible complications associated with the  procedure. These may include, but are not limited to: failure to achieve desired goals, infection, bleeding, organ or nerve damage, allergic reactions, paralysis, and death. In addition, the patient was informed of those risks and complications associated to Spine-related procedures, such as failure to decrease pain; infection (i.e.: Meningitis, epidural or intraspinal abscess); bleeding (i.e.: epidural hematoma, subarachnoid hemorrhage, or any other type of intraspinal or peri-dural bleeding); organ or nerve damage (i.e.: Any type of peripheral nerve, nerve root, or spinal cord injury) with subsequent damage to sensory, motor, and/or autonomic systems, resulting in permanent pain, numbness, and/or weakness of one or several areas of the body; allergic reactions; (i.e.: anaphylactic reaction); and/or death. Furthermore, the patient was informed of those risks and complications associated with the medications. These include, but are not limited to: allergic reactions (i.e.: anaphylactic or anaphylactoid reaction(s)); adrenal axis suppression; blood sugar elevation that in diabetics may result in ketoacidosis or comma; water retention that in patients with history of congestive heart failure may result in shortness of breath, pulmonary edema, and decompensation with resultant heart failure; weight gain; swelling or edema; medication-induced neural toxicity; particulate matter embolism and blood vessel occlusion with resultant organ, and/or nervous system infarction; and/or aseptic necrosis of one or more joints. Finally, the patient was informed that Medicine is not an exact science; therefore, there  is also the possibility of unforeseen or unpredictable risks and/or possible complications that may result in a catastrophic outcome. The patient indicated having understood very clearly. We have given the patient no guarantees and we have made no promises. Enough time was given to the patient to ask questions, all of which were answered to the patient's satisfaction. Ms. Haggard has indicated that she wanted to continue with the procedure. Attestation: I, the ordering provider, attest that I have discussed with the patient the benefits, risks, side-effects, alternatives, likelihood of achieving goals, and potential problems during recovery for the procedure that I have provided informed consent. Date: 06/27/2017; Time: 12:18 PM  Pre-Procedure Preparation:  Monitoring: As per clinic protocol. Respiration, ETCO2, SpO2, BP, heart rate and rhythm monitor placed and checked for adequate function Safety Precautions: Patient was assessed for positional comfort and pressure points before starting the procedure. Time-out: I initiated and conducted the "Time-out" before starting the procedure, as per protocol. The patient was asked to participate by confirming the accuracy of the "Time Out" information. Verification of the correct person, site, and procedure were performed and confirmed by me, the nursing staff, and the patient. "Time-out" conducted as per Joint Commission's Universal Protocol (UP.01.01.01). "Time-out" Date & Time: 06/27/2017; 1330 hrs.  Description of Procedure Process:   Position: Prone Target Area: For Lumbar Facet blocks, the target is the groove formed by the junction of the transverse process and superior articular process. For the L5 dorsal ramus, the target is the notch between superior articular process and sacral ala. For the S1 dorsal ramus, the target is the superior and lateral edge of the posterior S1 Sacral foramen. Approach: Paraspinal approach. Area Prepped: Entire Posterior  Lumbosacral Region Prepping solution: Hibiclens (4.0% Chlorhexidine gluconate solution) Safety Precautions: Aspiration looking for blood return was conducted prior to all injections. At no point did we inject any substances, as a needle was being advanced. No attempts were made at seeking any paresthesias. Safe injection practices and needle disposal techniques used. Medications properly checked for expiration dates. SDV (single dose vial) medications used. Description of the Procedure: Protocol guidelines were followed. The patient was placed in position over the fluoroscopy  table. The target area was identified and the area prepped in the usual manner. Skin desensitized using vapocoolant spray. Skin & deeper tissues infiltrated with local anesthetic. Appropriate amount of time allowed to pass for local anesthetics to take effect. Radiofrequency needles were introduced to the area of the medial branch at the junction of the superior articular process and transverse process using fluoroscopy. Using the [employer], sensory stimulation using 50 Hz was used to locate & identify the nerve, making sure that the needle was positioned such that there was no sensory stimulation below 0.3 V or above 0.7 V. Stimulation using 2 Hz was used to evaluate the motor component. Care was taken not to lesion any nerves that demonstrated motor stimulation of the lower extremities at an output of less than 2.5 times that of the sensory threshold, or a maximum of 2.0 V. Once satisfactory placement of the needles was achieved, the above solution was slowly injected after negative aspiration. After waiting for at least 2 minutes, the ablation was performed at 80 degrees C for 60 seconds.The needles were then removed and the area cleansed, making sure to leave some of the prepping solution back to take advantage of its long term bactericidal properties. Intra-operative Compliance: Compliant  Illustration of the  posterior view of the lumbar spine and the posterior neural structures. Laminae of L2 through S1 are labeled. DPRL5, dorsal primary ramus of L5; DPRS1, dorsal primary ramus of S1; DPR3, dorsal primary ramus of L3; FJ, facet (zygapophyseal) joint L3-L4; I, inferior articular process of L4; LB1, lateral branch of dorsal primary ramus of L1; IAB, inferior articular branches from L3 medial branch (supplies L4-L5 facet joint); IBP, intermediate branch plexus; MB3, medial branch of dorsal primary ramus of L3; NR3, third lumbar nerve root; S, superior articular process of L5; SAB, superior articular branches from L4 (supplies L4-5 facet joint also); TP3, transverse process of L3.  Vitals:   06/27/17 1403 06/27/17 1413 06/27/17 1423 06/27/17 1433  BP: 127/86 111/85 110/81 111/75  Pulse:      Resp: _0 Temp:  (!) 96.7 F (35.9 C)    SpO2: 99% 95% 96% 97%  Weight:      Height:        Start Time: 1332 hrs. End Time: 1405 hrs. Materials & Medications:  Needle(s) Type: Teflon-coated, curved tip, Radiofrequency needle(s) Gauge: 22G Length: 10cm Medication(s): We administered lactated ringers, midazolam, fentaNYL, lidocaine, triamcinolone acetonide, and ropivacaine (PF) 2 mg/mL (0.2%). Please see chart orders for dosing details.  Imaging Guidance (Spinal):  Type of Imaging Technique: Fluoroscopy Guidance (Spinal) Indication(s): Assistance in needle guidance and placement for procedures requiring needle placement in or near specific anatomical locations not easily accessible without such assistance. Exposure Time: Please see nurses notes. Contrast: None used. Fluoroscopic Guidance: I was personally present during the use of fluoroscopy. "Tunnel Vision Technique" used to obtain the best possible view of the target area. Parallax error corrected before commencing the procedure. "Direction-depth-direction" technique used to introduce the needle under continuous pulsed fluoroscopy. Once target was  reached, antero-posterior, oblique, and lateral fluoroscopic projection used confirm needle placement in all planes. Images permanently stored in EMR. Interpretation: No contrast injected. I personally interpreted the imaging intraoperatively. Adequate needle placement confirmed in multiple planes. Permanent images saved into the patient's record.  Antibiotic Prophylaxis:  Indication(s): None identified Antibiotic given: None  Post-operative Assessment:  EBL: None Complications: No immediate post-treatment complications observed by team, or reported by patient. Note: The patient  tolerated the entire procedure well. A repeat set of vitals were taken after the procedure and the patient was kept under observation following institutional policy, for this type of procedure. Post-procedural neurological assessment was performed, showing return to baseline, prior to discharge. The patient was provided with post-procedure discharge instructions, including a section on how to identify potential problems. Should any problems arise concerning this procedure, the patient was given instructions to immediately contact us, at any time, without hesitation. In any case, we plan to contact the patient by telephone for a follow-up status report regarding this interventional procedure. Comments:  No additional relevant information.  Plan of Care  Disposition: Discharge home  Discharge Date & Time: 06/27/2017; 1438 hrs.   Physician-requested Follow-up:  Return in about 2 weeks (around 07/11/2017) for RFA procedure under fluoro and sedation: Right L-FCT RFA.  Future Appointments Date Time Provider [place]  08/08/2017 12:00 PM [name], FNP [facility] None  08/08/2017 12:45 PM Vevelyn Francois, NP [facility] None  04/10/2018 3:00 PM Harlin Heys, MD [facility] None    Imaging Orders     DG C-Arm 1-60 Min-No Report  Procedure Orders     Radiofrequency,Lumbar      Radiofrequency,Lumbar  Medications ordered for procedure: Meds ordered this encounter  Medications  . lactated ringers infusion 1,000 mL  . midazolam (VERSED) 5 MG/5ML injection 1-2 mg    Make sure Flumazenil is available in the pyxis when using this medication. If oversedation occurs, administer 0.2 mg IV over 15 sec. If after 45 sec no response, administer 0.2 mg again over 1 min; may repeat at 1 min intervals; not to exceed 4 doses (1 mg)  . fentaNYL (SUBLIMAZE) injection 25-50 mcg    Make sure Narcan is available in the pyxis when using this medication. In the event of respiratory depression (RR< 8/min): Titrate NARCAN (naloxone) in increments of 0.1 to 0.2 mg IV at 2-3 minute intervals, until desired degree of reversal.  . lidocaine (XYLOCAINE) 2 % (with pres) injection 200 mg  . triamcinolone acetonide (KENALOG-40) injection 40 mg  . ropivacaine (PF) 2 mg/mL (0.2%) (NAROPIN) injection 9 mL  . oxyCODONE (OXY IR/ROXICODONE) 5 MG immediate release tablet    Sig: Take 1 tablet (5 mg total) by mouth every 8 (eight) hours as needed for severe pain.    Dispense:  120 tablet    Refill:  0    Do not place this medication, or any other prescription from our practice, on "Automatic Refill". Patient may have prescription filled one day early if pharmacy is closed on scheduled refill date. Do not fill until: 06/27/17 To last until: 07/07/17   Medications administered: We administered lactated ringers, midazolam, fentaNYL, lidocaine, triamcinolone acetonide, and ropivacaine (PF) 2 mg/mL (0.2%).  See the medical record for exact dosing, route, and time of administration.  New Prescriptions   OXYCODONE (OXY IR/ROXICODONE) 5 MG IMMEDIATE RELEASE TABLET    Take 1 tablet (5 mg total) by mouth every 8 (eight) hours as needed for severe pain.   Primary Care Physician: Margo Common, PA Location: [facility] Note by: Gaspar Cola, MD Date: 06/27/2017; Time:  4:02 PM  Disclaimer:  Medicine is not an Chief Strategy Officer. The only guarantee in medicine is that nothing is guaranteed. It is important to note that the decision to proceed with this intervention was based on the information collected from the patient. The Data and conclusions were drawn from the patient's questionnaire, the interview,  and the physical examination. Because the information was provided in large part by the patient, it cannot be guaranteed that it has not been purposely or unconsciously manipulated. Every effort has been made to obtain as much relevant data as possible for this evaluation. It is important to note that the conclusions that lead to this procedure are derived in large part from the available data. Always take into account that the treatment will also be dependent on availability of resources and existing treatment guidelines, considered by other Pain Management Practitioners as being common knowledge and practice, at the time of the intervention. For Medico-Legal purposes, it is also important to point out that variation in procedural techniques and pharmacological choices are the acceptable norm. The indications, contraindications, technique, and results of the above procedure should only be interpreted and judged by a Board-Certified Interventional Pain Specialist with extensive familiarity and expertise in the same exact procedure and technique.

## 2017-06-28 ENCOUNTER — Ambulatory Visit: Payer: Self-pay | Admitting: Pain Medicine

## 2017-06-28 ENCOUNTER — Telehealth: Payer: Self-pay | Admitting: *Deleted

## 2017-06-28 NOTE — Telephone Encounter (Signed)
Attempted to call for post procedure follow-up. Message left.

## 2017-06-29 ENCOUNTER — Ambulatory Visit: Payer: Medicare PPO | Admitting: Pain Medicine

## 2017-07-05 ENCOUNTER — Encounter: Payer: Self-pay | Admitting: Family Medicine

## 2017-07-05 ENCOUNTER — Other Ambulatory Visit: Payer: Self-pay | Admitting: Family Medicine

## 2017-07-06 ENCOUNTER — Telehealth: Payer: Self-pay

## 2017-07-06 NOTE — Telephone Encounter (Signed)
Please put in this order in as requested.

## 2017-07-06 NOTE — Telephone Encounter (Signed)
[employer] rep called and stated that they cannot authorize the right side lumbar facet RFA because the patient has not had two lumbar facets on the right side. She had 2 on the left, but only 1 on the right. Dr. Dossie Arbour needs to put in an order for right sided lumbar facets so I can get it approved. After that I will resend the request for the RFA. Thanks

## 2017-07-10 ENCOUNTER — Other Ambulatory Visit: Payer: Self-pay | Admitting: Pain Medicine

## 2017-07-10 DIAGNOSIS — G8929 Other chronic pain: Secondary | ICD-10-CM

## 2017-07-10 DIAGNOSIS — M545 Low back pain: Secondary | ICD-10-CM

## 2017-07-10 DIAGNOSIS — M47816 Spondylosis without myelopathy or radiculopathy, lumbar region: Secondary | ICD-10-CM

## 2017-07-18 ENCOUNTER — Ambulatory Visit: Payer: Self-pay | Admitting: Pain Medicine

## 2017-07-27 ENCOUNTER — Ambulatory Visit (HOSPITAL_BASED_OUTPATIENT_CLINIC_OR_DEPARTMENT_OTHER): Payer: Medicare PPO | Admitting: Pain Medicine

## 2017-07-27 ENCOUNTER — Ambulatory Visit
Admission: RE | Admit: 2017-07-27 | Discharge: 2017-07-27 | Disposition: A | Discharge status: Home / Self Care | Payer: Medicare PPO | Source: Ambulatory Visit | Attending: Pain Medicine | Admitting: Pain Medicine

## 2017-07-27 ENCOUNTER — Encounter: Payer: Self-pay | Admitting: Pain Medicine

## 2017-07-27 VITALS — BP 120/70 | HR 71 | Temp 97.5°F | Resp 18 | Ht 62.0 in | Wt 233.0 lb

## 2017-07-27 DIAGNOSIS — M47816 Spondylosis without myelopathy or radiculopathy, lumbar region: Secondary | ICD-10-CM | POA: Diagnosis not present

## 2017-07-27 DIAGNOSIS — M545 Low back pain, unspecified: Secondary | ICD-10-CM

## 2017-07-27 DIAGNOSIS — G8929 Other chronic pain: Secondary | ICD-10-CM | POA: Insufficient documentation

## 2017-07-27 DIAGNOSIS — M5136 Other intervertebral disc degeneration, lumbar region: Secondary | ICD-10-CM | POA: Diagnosis not present

## 2017-07-27 MED ORDER — ROPIVACAINE HCL 2 MG/ML IJ SOLN
9.0000 mL | Freq: Once | INTRAMUSCULAR | Status: AC
  Administered 2017-07-27: 9 mL via PERINEURAL
  Filled 2017-07-27: qty 10

## 2017-07-27 MED ORDER — LACTATED RINGERS IV SOLN
1000.0000 mL | Freq: Once | INTRAVENOUS | Status: AC
  Administered 2017-07-27: 1000 mL via INTRAVENOUS

## 2017-07-27 MED ORDER — LIDOCAINE HCL 2 % IJ SOLN
10.0000 mL | Freq: Once | INTRAMUSCULAR | Status: AC
  Administered 2017-07-27: 20 mg
  Filled 2017-07-27: qty 40

## 2017-07-27 MED ORDER — TRIAMCINOLONE ACETONIDE 40 MG/ML IJ SUSP
40.0000 mg | Freq: Once | INTRAMUSCULAR | Status: AC
  Administered 2017-07-27: 40 mg
  Filled 2017-07-27: qty 1

## 2017-07-27 MED ORDER — MIDAZOLAM HCL 5 MG/5ML IJ SOLN
1.0000 mg | INTRAMUSCULAR | Status: DC | PRN
  Administered 2017-07-27: 3 mg via INTRAVENOUS
  Filled 2017-07-27: qty 5

## 2017-07-27 NOTE — Patient Instructions (Signed)
____________________________________________________________________________________________  Post-Procedure instructions Instructions:  Apply ice: Fill a plastic sandwich bag with crushed ice. Cover it with a small towel and apply to injection site. Apply for 15 minutes then remove x 15 minutes. Repeat sequence on day of procedure, until you go to bed. The purpose is to minimize swelling and discomfort after procedure.  Apply heat: Apply heat to procedure site starting the day following the procedure. The purpose is to treat any soreness and discomfort from the procedure.  Food intake: Start with clear liquids (like water) and advance to regular food, as tolerated.   Physical activities: Keep activities to a minimum for the first 8 hours after the procedure.   Driving: If you have received any sedation, you are not allowed to drive for 24 hours after your procedure.  Blood thinner: Restart your blood thinner 6 hours after your procedure. (Only for those taking blood thinners)  Insulin: As soon as you can eat, you may resume your normal dosing schedule. (Only for those taking insulin)  Infection prevention: Keep procedure site clean and dry.  Post-procedure Pain Diary: Extremely important that this be done correctly and accurately. Recorded information will be used to determine the next step in treatment.  Pain evaluated is that of treated area only. Do not include pain from an untreated area.  Complete every hour, on the hour, for the initial 8 hours. Set an alarm to help you do this part accurately.  Do not go to sleep and have it completed later. It will not be accurate.  Follow-up appointment: Keep your follow-up appointment after the procedure. Usually 2 weeks for most procedures. (6 weeks in the case of radiofrequency.) Bring you pain diary.  Expect:  From numbing medicine (AKA: Local Anesthetics): Numbness or decrease in pain.  Onset: Full effect within 15 minutes of  injected.  Duration: It will depend on the type of local anesthetic used. On the average, 1 to 8 hours.   From steroids: Decrease in swelling or inflammation. Once inflammation is improved, relief of the pain will follow.  Onset of benefits: Depends on the amount of swelling present. The more swelling, the longer it will take for the benefits to be seen. In some cases, up to 10 days.  Duration: Steroids will stay in the system x 2 weeks. Duration of benefits will depend on multiple posibilities including persistent irritating factors.  From procedure: Some discomfort is to be expected once the numbing medicine wears off. This should be minimal if ice and heat are applied as instructed. Call if:  You experience numbness and weakness that gets worse with time, as opposed to wearing off.  New onset bowel or bladder incontinence. (Spinal procedures only)  Emergency Numbers:  Durning business hours (Monday - Thursday, 8:00 AM - 4:00 PM) (Friday, 9:00 AM - 12:00 Noon): [phone]  After hours: [phone] ____________________________________________________________________________________________    

## 2017-07-27 NOTE — Progress Notes (Addendum)
Patient's Name: Theresa [name]  MRN: [id number]  Referring Provider: Milinda Pointer, MD  DOB: [birth year]  PCP: Margo Common, PA  DOS: 07/27/2017  Note by: Gaspar Cola, MD  Service setting: Ambulatory outpatient  Specialty: Interventional Pain Management  Patient type: Established  Location: [facility] (AMB) Pain Management Facility  Visit type: Interventional Procedure   Primary Reason for Visit: Interventional Pain Management Treatment. CC: Back Pain (low)  Procedure:  Anesthesia, Analgesia, Anxiolysis:  Type: Diagnostic Medial Branch Facet Block #2 Region: Lumbar Level: L2, L3, L4, L5, & S1 Medial Branch Level(s) Laterality: Right  Type: Local Anesthesia with Moderate (Conscious) Sedation Local Anesthetic: Lidocaine 1% Route: Intravenous (IV) IV Access: Secured Sedation: Meaningful verbal contact was maintained at all times during the procedure  Indication(s): Analgesia and Anxiety   Indications: 1. Lumbar facet syndrome (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)   2. Lumbar facet hypertrophy (multilevel)   3. Chronic low back pain (Location of Primary Source of Pain) (Left)   4. DDD (degenerative disc disease), lumbar    Pain Score: Pre-procedure: 3 /10 Post-procedure: 0-No pain/10  Pre-op Assessment:  Theresa [name] is a 61 y.o. (year old), female patient, seen today for interventional treatment. She  has a past surgical history that includes Tubal ligation; Abdominal hysterectomy; Tonsillectomy; and Carpal tunnel release (Left). Theresa [name] has a current medication list which includes the following prescription(s): albuterol, albuterol, alprazolam, aspirin-caffeine, bisoprolol-hydrochlorothiazide, bupropion, cholecalciferol, fluticasone-salmeterol, furosemide, hydrocodone-acetaminophen, ipratropium-albuterol, menthol (topical analgesic), nicotine polacrilex, oxygen-helium, potassium chloride sa, ranitidine, roflumilast, sertraline, trazodone, hydrocodone-acetaminophen, hydrocodone-acetaminophen,  and oxycodone, and the following Facility-Administered Medications: midazolam. Her primarily concern today is the Back Pain (low)  Initial Vital Signs: There were no vitals taken for this visit. BMI: Estimated body mass index is 42.62 kg/m as calculated from the following:   Height as of this encounter: _0  (1.575 m).   Weight as of this encounter: 233 lb (105.7 kg).  Risk Assessment: Allergies: Reviewed. She is allergic to codeine and meloxicam.  Allergy Precautions: None required Coagulopathies: Reviewed. None identified.  Blood-thinner therapy: None at this time Active Infection(s): Reviewed. None identified. Theresa [name] is afebrile  Site Confirmation: Theresa [name] was asked to confirm the procedure and laterality before marking the site Procedure checklist: Completed Consent: Before the procedure and under the influence of no sedative(s), amnesic(s), or anxiolytics, the patient was informed of the treatment options, risks and possible complications. To fulfill our ethical and legal obligations, as recommended by the American Medical Association's Code of Ethics, I have informed the patient of my clinical impression; the nature and purpose of the treatment or procedure; the risks, benefits, and possible complications of the intervention; the alternatives, including doing nothing; the risk(s) and benefit(s) of the alternative treatment(s) or procedure(s); and the risk(s) and benefit(s) of doing nothing. The patient was provided information about the general risks and possible complications associated with the procedure. These may include, but are not limited to: failure to achieve desired goals, infection, bleeding, organ or nerve damage, allergic reactions, paralysis, and death. In addition, the patient was informed of those risks and complications associated to Spine-related procedures, such as failure to decrease pain; infection (i.e.: Meningitis, epidural or intraspinal abscess); bleeding  (i.e.: epidural hematoma, subarachnoid hemorrhage, or any other type of intraspinal or peri-dural bleeding); organ or nerve damage (i.e.: Any type of peripheral nerve, nerve root, or spinal cord injury) with subsequent damage to sensory, motor, and/or autonomic systems, resulting in permanent pain, numbness, and/or weakness of one or several areas of the body; allergic  reactions; (i.e.: anaphylactic reaction); and/or death. Furthermore, the patient was informed of those risks and complications associated with the medications. These include, but are not limited to: allergic reactions (i.e.: anaphylactic or anaphylactoid reaction(s)); adrenal axis suppression; blood sugar elevation that in diabetics may result in ketoacidosis or comma; water retention that in patients with history of congestive heart failure may result in shortness of breath, pulmonary edema, and decompensation with resultant heart failure; weight gain; swelling or edema; medication-induced neural toxicity; particulate matter embolism and blood vessel occlusion with resultant organ, and/or nervous system infarction; and/or aseptic necrosis of one or more joints. Finally, the patient was informed that Medicine is not an exact science; therefore, there is also the possibility of unforeseen or unpredictable risks and/or possible complications that may result in a catastrophic outcome. The patient indicated having understood very clearly. We have given the patient no guarantees and we have made no promises. Enough time was given to the patient to ask questions, all of which were answered to the patient's satisfaction. Theresa [name] has indicated that she wanted to continue with the procedure. Attestation: I, the ordering provider, attest that I have discussed with the patient the benefits, risks, side-effects, alternatives, likelihood of achieving goals, and potential problems during recovery for the procedure that I have provided informed  consent. Date: 07/27/2017; Time: 12:24 PM  Pre-Procedure Preparation:  Monitoring: As per clinic protocol. Respiration, ETCO2, SpO2, BP, heart rate and rhythm monitor placed and checked for adequate function Safety Precautions: Patient was assessed for positional comfort and pressure points before starting the procedure. Time-out: I initiated and conducted the "Time-out" before starting the procedure, as per protocol. The patient was asked to participate by confirming the accuracy of the "Time Out" information. Verification of the correct person, site, and procedure were performed and confirmed by me, the nursing staff, and the patient. "Time-out" conducted as per Joint Commission's Universal Protocol (UP.01.01.01). "Time-out" Date & Time: 07/27/2017; 1304 hrs.  Description of Procedure Process:   Position: Prone Target Area: For Lumbar Facet blocks, the target is the groove formed by the junction of the transverse process and superior articular process. For the L5 dorsal ramus, the target is the notch between superior articular process and sacral ala. For the S1 dorsal ramus, the target is the superior and lateral edge of the posterior S1 Sacral foramen. Approach: Paramedial approach. Area Prepped: Entire Posterior Lumbosacral Region Prepping solution: ChloraPrep (2% chlorhexidine gluconate and 70% isopropyl alcohol) Safety Precautions: Aspiration looking for blood return was conducted prior to all injections. At no point did we inject any substances, as a needle was being advanced. No attempts were made at seeking any paresthesias. Safe injection practices and needle disposal techniques used. Medications properly checked for expiration dates. SDV (single dose vial) medications used. Description of the Procedure: Protocol guidelines were followed. The patient was placed in position over the fluoroscopy table. The target area was identified and the area prepped in the usual manner. Skin desensitized  using vapocoolant spray. Skin & deeper tissues infiltrated with local anesthetic. Appropriate amount of time allowed to pass for local anesthetics to take effect. The procedure needle was introduced through the skin, ipsilateral to the reported pain, and advanced to the target area. Employing the "Medial Branch Technique", the needles were advanced to the angle made by the superior and medial portion of the transverse process, and the lateral and inferior portion of the superior articulating process of the targeted vertebral bodies. This area is known as "Burton's Eye" or  the "Eye of the [place] Dog". A procedure needle was introduced through the skin, and this time advanced to the angle made by the superior and medial border of the sacral ala, and the lateral border of the S1 vertebral body. This last needle was later repositioned at the superior and lateral border of the posterior S1 foramen. Negative aspiration confirmed. Solution injected in intermittent fashion, asking for systemic symptoms every 0.5cc of injectate. The needles were then removed and the area cleansed, making sure to leave some of the prepping solution back to take advantage of its long term bactericidal properties.   Illustration of the posterior view of the lumbar spine and the posterior neural structures. Laminae of L2 through S1 are labeled. DPRL5, dorsal primary ramus of L5; DPRS1, dorsal primary ramus of S1; DPR3, dorsal primary ramus of L3; FJ, facet (zygapophyseal) joint L3-L4; I, inferior articular process of L4; LB1, lateral branch of dorsal primary ramus of L1; IAB, inferior articular branches from L3 medial branch (supplies L4-L5 facet joint); IBP, intermediate branch plexus; MB3, medial branch of dorsal primary ramus of L3; NR3, third lumbar nerve root; S, superior articular process of L5; SAB, superior articular branches from L4 (supplies L4-5 facet joint also); TP3, transverse process of L3.  Vitals:   07/27/17 1310  07/27/17 1318 07/27/17 1328 07/27/17 1338  BP: 120/80 117/63 (!) 119/91 120/70  Pulse: 71     Resp: 16 (!) _0 Temp:  (!) 97.5 F (36.4 C)    TempSrc:  Temporal    SpO2: 98% 92% 98% 100%  Weight:      Height:        Start Time: 1304 hrs. End Time: 1309 hrs. Materials:  Needle(s) Type: Regular needle Gauge: 22G Length: 3.5-in Medication(s): We administered lactated ringers, midazolam, lidocaine, triamcinolone acetonide, and ropivacaine (PF) 2 mg/mL (0.2%). Please see chart orders for dosing details.  Imaging Guidance (Spinal):  Type of Imaging Technique: Fluoroscopy Guidance (Spinal) Indication(s): Assistance in needle guidance and placement for procedures requiring needle placement in or near specific anatomical locations not easily accessible without such assistance. Exposure Time: Please see nurses notes. Contrast: None used. Fluoroscopic Guidance: I was personally present during the use of fluoroscopy. "Tunnel Vision Technique" used to obtain the best possible view of the target area. Parallax error corrected before commencing the procedure. "Direction-depth-direction" technique used to introduce the needle under continuous pulsed fluoroscopy. Once target was reached, antero-posterior, oblique, and lateral fluoroscopic projection used confirm needle placement in all planes. Images permanently stored in EMR. Interpretation: No contrast injected. I personally interpreted the imaging intraoperatively. Adequate needle placement confirmed in multiple planes. Permanent images saved into the patient's record.  Antibiotic Prophylaxis:  Indication(s): None identified Antibiotic given: None  Post-operative Assessment:  EBL: None Complications: No immediate post-treatment complications observed by team, or reported by patient. Note: The patient tolerated the entire procedure well. A repeat set of vitals were taken after the procedure and the patient was kept under observation  following institutional policy, for this type of procedure. Post-procedural neurological assessment was performed, showing return to baseline, prior to discharge. The patient was provided with post-procedure discharge instructions, including a section on how to identify potential problems. Should any problems arise concerning this procedure, the patient was given instructions to immediately contact us, at any time, without hesitation. In any case, we plan to contact the patient by telephone for a follow-up status report regarding this interventional procedure. Comments:  No additional relevant information.  Plan of  Care  Interventional management options: Plan:   The patient will return in 2 weeks to evaluate the results of today's diagnostic left sided lumbar facet block #2. If the results of the procedure showed that the patient attained more than 50% relief of the pain for the duration of the local anesthetic, then we will schedule her to come back for a left-sided lumbar facet radiofrequency ablation under fluoroscopic guidance and IV sedation. The patient already had the right side done.     Imaging Orders     DG C-Arm 1-60 Min-No Report  Procedure Orders     LUMBAR FACET(MEDIAL BRANCH NERVE BLOCK) MBNB  Medications ordered for procedure: Meds ordered this encounter  Medications  . lactated ringers infusion 1,000 mL  . midazolam (VERSED) 5 MG/5ML injection 1-2 mg    Make sure Flumazenil is available in the pyxis when using this medication. If oversedation occurs, administer 0.2 mg IV over 15 sec. If after 45 sec no response, administer 0.2 mg again over 1 min; may repeat at 1 min intervals; not to exceed 4 doses (1 mg)  . lidocaine (XYLOCAINE) 2 % (with pres) injection 200 mg  . triamcinolone acetonide (KENALOG-40) injection 40 mg  . ropivacaine (PF) 2 mg/mL (0.2%) (NAROPIN) injection 9 mL   Medications administered: We administered lactated ringers, midazolam, lidocaine, triamcinolone  acetonide, and ropivacaine (PF) 2 mg/mL (0.2%).  See the medical record for exact dosing, route, and time of administration.  New Prescriptions   No medications on file   Disposition: Discharge home  Discharge Date & Time: 07/27/2017; 1350 hrs.   Physician-requested Follow-up: Return for post-procedure eval (2 wks)(w/ [name], NP). Future Appointments Date Time Provider [place]  08/08/2017 12:00 PM [name], FNP [facility] None  08/08/2017 12:45 PM Vevelyn Francois, NP [facility] None  08/15/2017 10:30 AM Milinda Pointer, MD [facility] None  04/10/2018 3:00 PM Harlin Heys, MD [facility] None   Primary Care Physician: Margo Common, PA Location: [facility] Outpatient Pain Management Facility Note by: Gaspar Cola, MD Date: 07/27/2017; Time: 2:25 PM  Disclaimer:  Medicine is not an Chief Strategy Officer. The only guarantee in medicine is that nothing is guaranteed. It is important to note that the decision to proceed with this intervention was based on the information collected from the patient. The Data and conclusions were drawn from the patient's questionnaire, the interview, and the physical examination. Because the information was provided in large part by the patient, it cannot be guaranteed that it has not [name] purposely or unconsciously manipulated. Every effort has [name] made to obtain as much relevant data as possible for this evaluation. It is important to note that the conclusions that lead to this procedure are derived in large part from the available data. Always take into account that the treatment will also be dependent on availability of resources and existing treatment guidelines, considered by other Pain Management Practitioners as being common knowledge and practice, at the time of the intervention. For Medico-Legal purposes, it is also important to point out that variation in procedural techniques and pharmacological choices are the acceptable norm. The  indications, contraindications, technique, and results of the above procedure should only be interpreted and judged by a Board-Certified Interventional Pain Specialist with extensive familiarity and expertise in the same exact procedure and technique.

## 2017-07-28 ENCOUNTER — Telehealth: Payer: Self-pay | Admitting: *Deleted

## 2017-07-28 NOTE — Telephone Encounter (Signed)
Call went straight to voicemail and patient does not have a voicemail that is set up.

## 2017-08-08 ENCOUNTER — Encounter: Payer: Self-pay | Admitting: Nurse Practitioner

## 2017-08-08 ENCOUNTER — Encounter: Payer: Self-pay | Admitting: Family

## 2017-08-08 ENCOUNTER — Ambulatory Visit: Payer: Medicare PPO | Attending: Family | Admitting: Family

## 2017-08-08 ENCOUNTER — Telehealth: Payer: Self-pay

## 2017-08-08 ENCOUNTER — Ambulatory Visit: Payer: Medicare PPO | Attending: Nurse Practitioner | Admitting: Nurse Practitioner

## 2017-08-08 VITALS — BP 126/77 | HR 73 | Temp 98.0°F | Resp 18 | Ht 62.0 in | Wt 229.0 lb

## 2017-08-08 VITALS — BP 114/77 | HR 76 | Wt 229.2 lb

## 2017-08-08 DIAGNOSIS — I509 Heart failure, unspecified: Secondary | ICD-10-CM | POA: Diagnosis present

## 2017-08-08 DIAGNOSIS — Z8249 Family history of ischemic heart disease and other diseases of the circulatory system: Secondary | ICD-10-CM | POA: Diagnosis not present

## 2017-08-08 DIAGNOSIS — M5116 Intervertebral disc disorders with radiculopathy, lumbar region: Secondary | ICD-10-CM | POA: Insufficient documentation

## 2017-08-08 DIAGNOSIS — M25559 Pain in unspecified hip: Secondary | ICD-10-CM | POA: Diagnosis not present

## 2017-08-08 DIAGNOSIS — I110 Hypertensive heart disease with heart failure: Secondary | ICD-10-CM | POA: Diagnosis not present

## 2017-08-08 DIAGNOSIS — G8929 Other chronic pain: Secondary | ICD-10-CM

## 2017-08-08 DIAGNOSIS — Z90710 Acquired absence of both cervix and uterus: Secondary | ICD-10-CM | POA: Diagnosis not present

## 2017-08-08 DIAGNOSIS — J449 Chronic obstructive pulmonary disease, unspecified: Secondary | ICD-10-CM | POA: Insufficient documentation

## 2017-08-08 DIAGNOSIS — Z87442 Personal history of urinary calculi: Secondary | ICD-10-CM | POA: Insufficient documentation

## 2017-08-08 DIAGNOSIS — Z888 Allergy status to other drugs, medicaments and biological substances status: Secondary | ICD-10-CM | POA: Insufficient documentation

## 2017-08-08 DIAGNOSIS — Z885 Allergy status to narcotic agent status: Secondary | ICD-10-CM | POA: Diagnosis not present

## 2017-08-08 DIAGNOSIS — M19011 Primary osteoarthritis, right shoulder: Secondary | ICD-10-CM | POA: Insufficient documentation

## 2017-08-08 DIAGNOSIS — M79605 Pain in left leg: Secondary | ICD-10-CM | POA: Insufficient documentation

## 2017-08-08 DIAGNOSIS — F329 Major depressive disorder, single episode, unspecified: Secondary | ICD-10-CM

## 2017-08-08 DIAGNOSIS — Z7901 Long term (current) use of anticoagulants: Secondary | ICD-10-CM | POA: Insufficient documentation

## 2017-08-08 DIAGNOSIS — M48061 Spinal stenosis, lumbar region without neurogenic claudication: Secondary | ICD-10-CM | POA: Insufficient documentation

## 2017-08-08 DIAGNOSIS — Z801 Family history of malignant neoplasm of trachea, bronchus and lung: Secondary | ICD-10-CM | POA: Insufficient documentation

## 2017-08-08 DIAGNOSIS — M545 Low back pain, unspecified: Secondary | ICD-10-CM

## 2017-08-08 DIAGNOSIS — I10 Essential (primary) hypertension: Secondary | ICD-10-CM

## 2017-08-08 DIAGNOSIS — Z6841 Body Mass Index (BMI) 40.0 and over, adult: Secondary | ICD-10-CM | POA: Diagnosis not present

## 2017-08-08 DIAGNOSIS — G894 Chronic pain syndrome: Secondary | ICD-10-CM | POA: Diagnosis not present

## 2017-08-08 DIAGNOSIS — M7918 Myalgia, other site: Secondary | ICD-10-CM | POA: Insufficient documentation

## 2017-08-08 DIAGNOSIS — F419 Anxiety disorder, unspecified: Secondary | ICD-10-CM | POA: Diagnosis not present

## 2017-08-08 DIAGNOSIS — M533 Sacrococcygeal disorders, not elsewhere classified: Secondary | ICD-10-CM | POA: Diagnosis not present

## 2017-08-08 DIAGNOSIS — M47816 Spondylosis without myelopathy or radiculopathy, lumbar region: Secondary | ICD-10-CM | POA: Insufficient documentation

## 2017-08-08 DIAGNOSIS — M79601 Pain in right arm: Secondary | ICD-10-CM | POA: Insufficient documentation

## 2017-08-08 DIAGNOSIS — Z811 Family history of alcohol abuse and dependence: Secondary | ICD-10-CM | POA: Insufficient documentation

## 2017-08-08 DIAGNOSIS — J410 Simple chronic bronchitis: Secondary | ICD-10-CM

## 2017-08-08 DIAGNOSIS — Z9981 Dependence on supplemental oxygen: Secondary | ICD-10-CM | POA: Insufficient documentation

## 2017-08-08 DIAGNOSIS — E669 Obesity, unspecified: Secondary | ICD-10-CM | POA: Insufficient documentation

## 2017-08-08 DIAGNOSIS — M5412 Radiculopathy, cervical region: Secondary | ICD-10-CM | POA: Insufficient documentation

## 2017-08-08 DIAGNOSIS — N281 Cyst of kidney, acquired: Secondary | ICD-10-CM | POA: Insufficient documentation

## 2017-08-08 DIAGNOSIS — I2721 Secondary pulmonary arterial hypertension: Secondary | ICD-10-CM | POA: Insufficient documentation

## 2017-08-08 DIAGNOSIS — G5603 Carpal tunnel syndrome, bilateral upper limbs: Secondary | ICD-10-CM | POA: Diagnosis not present

## 2017-08-08 DIAGNOSIS — E559 Vitamin D deficiency, unspecified: Secondary | ICD-10-CM | POA: Insufficient documentation

## 2017-08-08 DIAGNOSIS — Z79899 Other long term (current) drug therapy: Secondary | ICD-10-CM | POA: Insufficient documentation

## 2017-08-08 DIAGNOSIS — M25511 Pain in right shoulder: Secondary | ICD-10-CM | POA: Diagnosis not present

## 2017-08-08 DIAGNOSIS — G4730 Sleep apnea, unspecified: Secondary | ICD-10-CM | POA: Diagnosis not present

## 2017-08-08 DIAGNOSIS — I5032 Chronic diastolic (congestive) heart failure: Secondary | ICD-10-CM | POA: Insufficient documentation

## 2017-08-08 DIAGNOSIS — R062 Wheezing: Secondary | ICD-10-CM | POA: Diagnosis not present

## 2017-08-08 DIAGNOSIS — R82998 Other abnormal findings in urine: Secondary | ICD-10-CM | POA: Insufficient documentation

## 2017-08-08 DIAGNOSIS — M1610 Unilateral primary osteoarthritis, unspecified hip: Secondary | ICD-10-CM | POA: Insufficient documentation

## 2017-08-08 DIAGNOSIS — R079 Chest pain, unspecified: Secondary | ICD-10-CM | POA: Diagnosis not present

## 2017-08-08 DIAGNOSIS — Z7982 Long term (current) use of aspirin: Secondary | ICD-10-CM | POA: Insufficient documentation

## 2017-08-08 DIAGNOSIS — M899 Disorder of bone, unspecified: Secondary | ICD-10-CM | POA: Diagnosis not present

## 2017-08-08 DIAGNOSIS — K2970 Gastritis, unspecified, without bleeding: Secondary | ICD-10-CM | POA: Insufficient documentation

## 2017-08-08 DIAGNOSIS — F17210 Nicotine dependence, cigarettes, uncomplicated: Secondary | ICD-10-CM | POA: Diagnosis not present

## 2017-08-08 DIAGNOSIS — Z823 Family history of stroke: Secondary | ICD-10-CM | POA: Diagnosis not present

## 2017-08-08 DIAGNOSIS — Z720 Tobacco use: Secondary | ICD-10-CM

## 2017-08-08 DIAGNOSIS — Z7951 Long term (current) use of inhaled steroids: Secondary | ICD-10-CM | POA: Diagnosis not present

## 2017-08-08 MED ORDER — HYDROCODONE-ACETAMINOPHEN 5-325 MG PO TABS: 1.0000 | ORAL_TABLET | Freq: Three times a day (TID) | ORAL | 0 refills | Status: DC | PRN

## 2017-08-08 NOTE — Progress Notes (Signed)
[name] ID: Theresa [name], female    DOB: 1956/03/18, 61 y.o.   MRN: [id number]  HPI Theresa [name] is a 61 y/o female with a history of chronic low back pain, HTN, depression, COPD on long-term oxygen, anxiety, carpal tunnel syndrome, long-standing tobacco use and chronic heart failure.  Last echo was done 11/03/15 but unable to access results. Echo done on 02/19/15 showed an EF of 50% without valvular stenosis. Recent PFT's done on 01/18/16  Admitted 02/13/17 due to COPD exacerbation. Initially needed bipap and transitioned back to 2L of oxygen. Given prednisone and antibiotics for 5 days at discharge. Psychiatry consult done due to daughter's death. Recently admitted on Nov 23, 2016 due to COPD exacerbation. Initially treated with bipap and then baseline oxygen was resumed. Given IV steroids and discharged on oral prednisone. Was in [facility] ED on 03/07/16 due to shoulder pain after a mechanical fall. Was treated as whiplash and was given mobic. Last admission was 11/02/15 due to COPD exacerbation.  She presents today for a follow-up visit with a chief complaint of moderate shortness of breath upon minimal exertion. She describes this as chronic in nature having been present for several years with varying levels of severity. She has associated fatigue, cough, depression and difficulty sleeping along with this. She denies any chest pain, edema, palpitations, dizziness or weight gain.   Past Medical History:  Diagnosis Date  . Anxiety   . Carpal tunnel syndrome   . CHF (congestive heart failure) ([place])   . CHF (congestive heart failure) ([place])    1/18  . Chronic generalized abdominal pain   . Chronic rhinitis   . COPD (chronic obstructive pulmonary disease) ([place])   . DDD (degenerative disc disease), cervical   . Depression   . Edema   . Flu    1/18  . Gastritis   . Hematuria   . Hypertension   . Kidney stones   . Low back pain   . Lumbar radiculitis   . Malodorous urine   . Muscle weakness   . Obesity    . Renal cyst   . Sensory urge incontinence   . Tobacco abuse    Past Surgical History:  Procedure Laterality Date  . ABDOMINAL HYSTERECTOMY    . CARPAL TUNNEL RELEASE Left   . TONSILLECTOMY    . TUBAL LIGATION     Family History  Problem Relation Age of Onset  . Heart disease Mother   . Stroke Mother   . Coronary artery disease Mother   . Lung cancer Sister   . Alcohol abuse Father   . Heart disease Father   . Prostate cancer Neg Hx   . Kidney cancer Neg Hx   . Bladder Cancer Neg Hx    Social History  Substance Use Topics  . Smoking status: Current Every Day Smoker    Packs/day: 1.00    Years: 40.00    Types: Cigarettes  . Smokeless tobacco: Never Used     Comment: first day of patch [name] states she's trying  . Alcohol use No   Allergies  Allergen Reactions  . Codeine Nausea And Vomiting  . Meloxicam Other (See Comments)    Stomach pain   Prior to Admission medications   Medication Sig Start Date End Date Taking? Authorizing Provider  albuterol (PROVENTIL HFA;VENTOLIN HFA) 108 (90 Base) MCG/ACT inhaler Inhale 2 puffs into [facility] lungs every 6 (six) hours as needed for wheezing. Reported on 11/03/2015 11/02/16  Yes Demetrios Loll, MD  albuterol (VENTOLIN  HFA) 108 (90 Base) MCG/ACT inhaler INHALE 2 PUFFS BY MOUTH FOUR TIMES DAILY AS NEEDED 06/06/17  Yes [provider]  ALPRAZolam (XANAX) 0.5 MG tablet Take 1 tablet (0.5 mg total) by mouth 2 (two) times daily as needed for anxiety. [name] taking differently: Take 0.25 mg by mouth 2 (two) times daily as needed for anxiety.  12/09/16  Yes Braxen Dobek A, FNP  Aspirin-Caffeine (BC FAST PAIN RELIEF ARTHRITIS) 1000-65 MG PACK Take 1 packet by mouth every 6 (six) hours as needed (Pain).   Yes [provider]  bisoprolol-hydrochlorothiazide (ZIAC) 10-6.25 MG tablet TAKE 1 TABLET BY MOUTH EVERY DAY 07/06/17  Yes Chrismon, Vickki Muff, PA  buPROPion (WELLBUTRIN XL) 150 MG 24 hr tablet Take 150 mg by mouth daily.   Yes  [provider]  Cholecalciferol ([employer] D3) 2000 units CAPS Take 1 capsule by mouth daily.   Yes [provider]  Fluticasone-Salmeterol (ADVAIR DISKUS) 250-50 MCG/DOSE AEPB INHALE 1 PUFF INTO LUNGS TWICE A DAY 05/23/16  Yes [provider]  furosemide (LASIX) 40 MG tablet Take 40 mg by mouth 2 (two) times daily.   Yes [provider]  ipratropium-albuterol (DUONEB) 0.5-2.5 (3) MG/3ML SOLN Take 3 mLs by nebulization every 6 (six) hours as needed (shortness of breath). 11/02/16  Yes Demetrios Loll, MD  Menthol, Topical Analgesic, (BENGAY COLD THERAPY) 5 % GEL Apply 1 application topically daily.   Yes [provider]  OXYGEN Inhale 2.5 L into [facility] lungs continuous. Reported on 11/03/2015   Yes [provider]  potassium chloride SA (K-DUR,KLOR-CON) 20 MEQ tablet Take 20 mEq by mouth 2 (two) times daily.   Yes [provider]  ranitidine (ZANTAC) 150 MG tablet Take 150 mg by mouth 2 (two) times daily.   Yes [provider]  roflumilast (DALIRESP) 500 MCG TABS tablet Take 500 mcg by mouth daily.   Yes [provider]  sertraline (ZOLOFT) 100 MG tablet TAKE 1.5 TABLET (150 MG TOTAL) BY MOUTH DAILY. 02/23/17  Yes Chrismon, Vickki Muff, PA  traZODone (DESYREL) 50 MG tablet TK 1 T PO HS PRN 06/20/17  Yes [provider]  HYDROcodone-acetaminophen (NORCO/VICODIN) 5-325 MG tablet Take 1 tablet by mouth every 8 (eight) hours as needed for severe pain. 08/22/17 09/21/17  Vevelyn Francois, NP  HYDROcodone-acetaminophen (NORCO/VICODIN) 5-325 MG tablet Take 1 tablet by mouth every 8 (eight) hours as needed for severe pain. 09/21/17 10/21/17  Vevelyn Francois, NP  HYDROcodone-acetaminophen (NORCO/VICODIN) 5-325 MG tablet Take 1 tablet by mouth every 8 (eight) hours as needed for severe pain. 10/21/17 11/20/17  Vevelyn Francois, NP   Review of Systems  Constitutional: Positive for fatigue. Negative for appetite change.  HENT: Negative for congestion,  postnasal drip and sore throat.   Eyes: Negative.   Respiratory: Positive for cough (green/brown sputum) and shortness of breath. Negative for chest tightness and wheezing.   Cardiovascular: Negative for chest pain, palpitations and leg swelling.  Gastrointestinal: Negative for abdominal distention, abdominal pain and nausea.  Endocrine: Negative.   Genitourinary: Negative.   Musculoskeletal: Positive for back pain. Negative for neck pain.  Skin: Negative.   Allergic/Immunologic: Negative.   Neurological: Negative for dizziness and light-headedness.  Hematological: Negative for adenopathy. Does not bruise/bleed easily.  Psychiatric/Behavioral: Positive for dysphoric mood and sleep disturbance (wearing oxygen & bipap at night). Negative for agitation and suicidal ideas. Theresa [name] is nervous/anxious.    Vitals:   08/08/17 1231  BP: 114/77  Pulse: 76  SpO2:  95%  Weight: 229 lb 4 oz (104 kg)   Wt Readings from Last 3 Encounters:  08/08/17 229 lb (103.9 kg)  08/08/17 229 lb 4 oz (104 kg)  07/27/17 233 lb (105.7 kg)    Lab Results  Component Value Date   CREATININE 0.79 03/02/2017   CREATININE 0.73 02/14/2017   CREATININE 0.81 02/13/2017   Physical Exam  Constitutional: She is oriented to person, place, and time. She appears well-developed and well-nourished.  HENT:  Head: Normocephalic and atraumatic.  Neck: Normal range of motion. Neck supple. No JVD present.  Cardiovascular: Normal rate and regular rhythm.   Pulmonary/Chest: Effort normal. She has wheezes in Theresa right lower field. She has no rales.  Abdominal: Soft. She exhibits no distension. There is no tenderness.  Musculoskeletal: She exhibits no edema or tenderness.  Neurological: She is alert and oriented to person, place, and time.  Skin: Skin is warm and dry.  Psychiatric: Her behavior is normal. Thought content normal. Her mood appears anxious. She exhibits a depressed mood.  Nursing note and vitals  reviewed.     Assessment & Plan:  1: Chronic heart failure with preserved ejection fraction- - NYHA class III - euvolemic today - continues to weigh daily at home and has noticed a gradual weight loss. Reminded to call for an overnight weight gain of >2 pounds or a weekly weight gain of >5 pounds - by our scale, she has lost another 6 pounds since she was last here; says that she's decreased soda consumption and is drinking more water - continues to take 54m furosemide BID along with 253m potassium BID - BMP from 08/08/17 reviewed; potassium 4.1, sodium 142 and GFR 91 - monitor sodium content carefully - saw cardiologist (CClayborn Bigness5/23/18  - does not fit criteria for REDS vest reading due to elevated BMI  2: HTN- - BP looks good today  3: COPD- - wearing bipap nightly - oxygen at 2.5L at bedtime and then as needed during [facility] day - last saw pulmonology (FRaul Delon 07/05/17  4: Tobacco use- - alternates between [facility] patch, gum and actual cigarettes; doesn't smoke while wearing Theresa patch - removes herself from Theresa oxygen when smoking - complete cessation discussed for 3 minutes with her  5: Reactive depression- - daughter suddenly died 01/20/07/2018nd [name] admits that she's depressed  - had seen psychiatrist (KNicolasa Duckingbut has now been told that they do not accept her insurance. She was given a list of providers from Dr. KaWaylan Bogaffice that may accept her insurance.  [name] did not bring her medications nor a list. Each medication was verbally reviewed with [facility] [name] and she was encouraged to bring Theresa bottles to every visit to confirm accuracy of list.  Return here in 3 months or sooner for any questions/problems before then

## 2017-08-08 NOTE — Patient Instructions (Signed)
Continue weighing daily and call for an overnight weight gain of > 2 pounds or a weekly weight gain of >5 pounds.    Smoking Cessation Quitting smoking is important to your health and has many advantages. However, it is not always easy to quit since nicotine is a very addictive drug. Oftentimes, people try 3 times or more before being able to quit. This document explains the best ways for you to prepare to quit smoking. Quitting takes hard work and a lot of effort, but you can do it. ADVANTAGES OF QUITTING SMOKING  You will live longer, feel better, and live better.  Your body will feel the impact of quitting smoking almost immediately.  Within 20 minutes, blood pressure decreases. Your pulse returns to its normal level.  After 8 hours, carbon monoxide levels in the blood return to normal. Your oxygen level increases.  After 24 hours, the chance of having a heart attack starts to decrease. Your breath, hair, and body stop smelling like smoke.  After 48 hours, damaged nerve endings begin to recover. Your sense of taste and smell improve.  After 72 hours, the body is virtually free of nicotine. Your bronchial tubes relax and breathing becomes easier.  After 2 to 12 weeks, lungs can hold more air. Exercise becomes easier and circulation improves.  The risk of having a heart attack, stroke, cancer, or lung disease is greatly reduced.  After 1 year, the risk of coronary heart disease is cut in half.  After 5 years, the risk of stroke falls to the same as a nonsmoker.  After 10 years, the risk of lung cancer is cut in half and the risk of other cancers decreases significantly.  After 15 years, the risk of coronary heart disease drops, usually to the level of a nonsmoker.  If you are pregnant, quitting smoking will improve your chances of having a healthy baby.  The people you live with, especially any children, will be healthier.  You will have extra money to spend on things other  than cigarettes. QUESTIONS TO THINK ABOUT BEFORE ATTEMPTING TO QUIT You may want to talk about your answers with your health care provider.  Why do you want to quit?  If you tried to quit in the past, what helped and what did not?  What will be the most difficult situations for you after you quit? How will you plan to handle them?  Who can help you through the tough times? Your family? Friends? A health care provider?  What pleasures do you get from smoking? What ways can you still get pleasure if you quit? Here are some questions to ask your health care provider:  How can you help me to be successful at quitting?  What medicine do you think would be best for me and how should I take it?  What should I do if I need more help?  What is smoking withdrawal like? How can I get information on withdrawal? GET READY  Set a quit date.  Change your environment by getting rid of all cigarettes, ashtrays, matches, and lighters in your home, car, or work. Do not let people smoke in your home.  Review your past attempts to quit. Think about what worked and what did not. GET SUPPORT AND ENCOURAGEMENT You have a better chance of being successful if you have help. You can get support in many ways.  Tell your family, friends, and coworkers that you are going to quit and need their support. Ask   them not to smoke around you.  Get individual, group, or telephone counseling and support. Programs are available at local hospitals and health centers. Call your local health department for information about programs in your area.  Spiritual beliefs and practices may help some smokers quit.  Download a "quit meter" on your computer to keep track of quit statistics, such as how long you have gone without smoking, cigarettes not smoked, and money saved.  Get a self-help book about quitting smoking and staying off tobacco. LEARN NEW SKILLS AND BEHAVIORS  Distract yourself from urges to smoke. Talk to  someone, go for a walk, or occupy your time with a task.  Change your normal routine. Take a different route to work. Drink tea instead of coffee. Eat breakfast in a different place.  Reduce your stress. Take a hot bath, exercise, or read a book.  Plan something enjoyable to do every day. Reward yourself for not smoking.  Explore interactive web-based programs that specialize in helping you quit. GET MEDICINE AND USE IT CORRECTLY Medicines can help you stop smoking and decrease the urge to smoke. Combining medicine with the above behavioral methods and support can greatly increase your chances of successfully quitting smoking.  Nicotine replacement therapy helps deliver nicotine to your body without the negative effects and risks of smoking. Nicotine replacement therapy includes nicotine gum, lozenges, inhalers, nasal sprays, and skin patches. Some may be available over-the-counter and others require a prescription.  Antidepressant medicine helps people abstain from smoking, but how this works is unknown. This medicine is available by prescription.  Nicotinic receptor partial agonist medicine simulates the effect of nicotine in your brain. This medicine is available by prescription. Ask your health care provider for advice about which medicines to use and how to use them based on your health history. Your health care provider will tell you what side effects to look out for if you choose to be on a medicine or therapy. Carefully read the information on the package. Do not use any other product containing nicotine while using a nicotine replacement product.  RELAPSE OR DIFFICULT SITUATIONS Most relapses occur within the first 3 months after quitting. Do not be discouraged if you start smoking again. Remember, most people try several times before finally quitting. You may have symptoms of withdrawal because your body is used to nicotine. You may crave cigarettes, be irritable, feel very hungry, cough  often, get headaches, or have difficulty concentrating. The withdrawal symptoms are only temporary. They are strongest when you first quit, but they will go away within 10-14 days. To reduce the chances of relapse, try to:  Avoid drinking alcohol. Drinking lowers your chances of successfully quitting.  Reduce the amount of caffeine you consume. Once you quit smoking, the amount of caffeine in your body increases and can give you symptoms, such as a rapid heartbeat, sweating, and anxiety.  Avoid smokers because they can make you want to smoke.  Do not let weight gain distract you. Many smokers will gain weight when they quit, usually less than 10 pounds. Eat a healthy diet and stay active. You can always lose the weight gained after you quit.  Find ways to improve your mood other than smoking. FOR MORE INFORMATION  www.smokefree.gov  Document Released: 09/27/2001 Document Revised: 02/17/2014 Document Reviewed: 01/12/2012 [employer] Patient Information 2015 [employer]. This information is not intended to replace advice given to you by your health care provider. Make sure you discuss any questions you have with your   provider.

## 2017-08-08 NOTE — Patient Instructions (Addendum)
You have been given 3 prescriptions for Hydrocodone today.  ____________________________________________________________________________________________  Medication Rules  Applies to: All patients receiving prescriptions (written or electronic).  Pharmacy of record: Pharmacy where electronic prescriptions will be sent. If written prescriptions are taken to a different pharmacy, please inform the nursing staff. The pharmacy listed in the electronic medical record should be the one where you would like electronic prescriptions to be sent.  Prescription refills: Only during scheduled appointments. Applies to both, written and electronic prescriptions.  NOTE: The following applies primarily to controlled substances (Opioid* Pain Medications).   Patient's responsibilities: 1. Pain Pills: Bring all pain pills to every appointment (except for procedure appointments). 2. Pill Bottles: Bring pills in original pharmacy bottle. Always bring newest bottle. Bring bottle, even if empty. 3. Medication refills: You are responsible for knowing and keeping track of what medications you need refilled. The day before your appointment, write a list of all prescriptions that need to be refilled. Bring that list to your appointment and give it to the admitting nurse. Prescriptions will be written only during appointments. If you forget a medication, it will not be "Called in", "Faxed", or "electronically sent". You will need to get another appointment to get these prescribed. 4. Prescription Accuracy: You are responsible for carefully inspecting your prescriptions before leaving our office. Have the discharge nurse carefully go over each prescription with you, before taking them home. Make sure that your name is accurately spelled, that your address is correct. Check the name and dose of your medication to make sure it is accurate. Check the number of pills, and the written instructions to make sure they are clear and  accurate. Make sure that you are given enough medication to last until your next medication refill appointment. 5. Taking Medication: Take medication as prescribed. Never take more pills than instructed. Never take medication more frequently than prescribed. Taking less pills or less frequently is permitted and encouraged, when it comes to controlled substances (written prescriptions).  6. Inform other Doctors: Always inform, all of your healthcare providers, of all the medications you take. 7. Pain Medication from other Providers: You are not allowed to accept any additional pain medication from any other Doctor or Healthcare provider. There are two exceptions to this rule. (see below) In the event that you require additional pain medication, you are responsible for notifying us, as stated below. 8. Medication Agreement: You are responsible for carefully reading and following our Medication Agreement. This must be signed before receiving any prescriptions from our practice. Safely store a copy of your signed Agreement. Violations to the Agreement will result in no further prescriptions. (Additional copies of our Medication Agreement are available upon request.) 9. Laws, Rules, & Regulations: All patients are expected to follow all Federal and [employer], [employer], Rules, [employer]. Ignorance of the Laws does not constitute a valid excuse. The use of any illegal substances is prohibited. 10. Adopted CDC guidelines & recommendations: Target dosing levels will be at or below 60 MME/day. Use of benzodiazepines** is not recommended.  Exceptions: There are only two exceptions to the rule of not receiving pain medications from other Healthcare Providers. 1. Exception #1 (Emergencies): In the event of an emergency (i.e.: accident requiring emergency care), you are allowed to receive additional pain medication. However, you are responsible for: As soon as you are able, call our office [phone], at any  time of the day or night, and leave a message stating your name, the date and nature of  the emergency, and the name and dose of the medication prescribed. In the event that your call is answered by a member of our staff, make sure to document and save the date, time, and the name of the person that took your information.  2. Exception #2 (Planned Surgery): In the event that you are scheduled by another doctor or dentist to have any type of surgery or procedure, you are allowed (for a period no longer than 30 days), to receive additional pain medication, for the acute post-op pain. However, in this case, you are responsible for picking up a copy of our "Post-op Pain Management for Surgeons" handout, and giving it to your surgeon or dentist. This document is available at our office, and does not require an appointment to obtain it. Simply go to our office during business hours (Monday-Thursday from 8:00 AM to 4:00 PM) (Friday 8:00 AM to 12:00 Noon) or if you have a scheduled appointment with [place], prior to your surgery, and ask for it by name. In addition, you will need to provide [place] with your name, name of your surgeon, type of surgery, and date of procedure or surgery.  *Opioid medications include: morphine, codeine, oxycodone, oxymorphone, hydrocodone, hydromorphone, meperidine, tramadol, tapentadol, buprenorphine, fentanyl, methadone. **Benzodiazepine medications include: diazepam (Valium), alprazolam (Xanax), clonazepam (Klonopine), lorazepam (Ativan), clorazepate (Tranxene), chlordiazepoxide (Librium), estazolam (Prosom), oxazepam (Serax), temazepam (Restoril), triazolam (Halcion)  ____________________________________________________________________________________________

## 2017-08-08 NOTE — Progress Notes (Signed)
Patient's Name: Theresa [name]  MRN: [id number]  Referring Provider: Margo Common, [place]  DOB: 1956-03-30  PCP: Margo Common, PA  DOS: 08/08/2017  Note by: Vevelyn Francois NP  Service setting: Ambulatory outpatient  Specialty: Interventional Pain Management  Location: [facility] (AMB) Pain Management Facility    Patient type: Established    Primary Reason(s) for Visit: Encounter for prescription drug management & post-procedure evaluation of chronic illness with mild to moderate exacerbation(Level of risk: moderate) CC: Back Pain (low)  HPI  [name] is a 61 y.o. year old, female patient, who comes today for a post-procedure evaluation and medication management. She has Neuritis or radiculitis due to rupture of lumbar intervertebral disc; Decreased motor strength; Essential (primary) hypertension; Clinical depression; DDD (degenerative disc disease), lumbar; CAFL (chronic airflow limitation) ([place]); Chronic rhinitis; Carpal tunnel syndrome; Blood clotting disorder ([place]); Anxiety state; Chronic diastolic heart failure ([place]); Chest pain; Tobacco abuse; Encounter for therapeutic drug level monitoring; Encounter for chronic pain management; Pain management; Neurogenic pain; Neuropathic pain; Musculoskeletal pain; Lumbar spondylosis (L4-5); Chronic low back pain (Location of Primary Source of Pain) (Left); Chronic lower extremity pain (Location of Secondary source of pain) (Left); Chronic lumbar radicular pain (L4 & S1 Dermatome) (Left); Chronic shoulder pain (Right side); Chronic upper extremity pain (Right-sided); Cervical radiculitis (Right side); Abnormal x-ray of lumbar spine; Chronic pain syndrome (significant psychosocial component); 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; Coagulation disorder ([place]); Chronic hip pain (Location of Tertiary Source of Pain) (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); 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); Obesity, Class III, BMI 40-49.9 (morbid obesity) ([place]);  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[place]); Vitamin D deficiency; Long term current use of opiate analgesic; Long term prescription opiate use; Opiate use (15 MME/Day); Lumbar facet syndrome (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); Chronic sacroiliac joint pain (Bilateral); Sleep apnea; Supplemental oxygen dependent; Lumbar facet hypertrophy (multilevel); Lumbar spinal stenosis (L4-5); 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); 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 Her primarily concern today is the Back Pain (low)  Pain Assessment: Location: Lower Back Radiating: denies Onset: More than a month ago Duration: Chronic pain Quality: Dull, Stabbing Severity: 4 /10 (self-reported pain score)  Note: Reported level is compatible with observation.                    Effect on ADL:   Timing: Constant Modifying factors: rest  Theresa [name] was last seen on 05/11/2017 for a procedure. During today's appointment we reviewed [name]'s post-procedure results, as well as her outpatient medication regimen.  Further details on both, my assessment(s), as well as the proposed treatment plan, please see below.  Controlled Substance Pharmacotherapy Assessment REMS (Risk Evaluation and Mitigation Strategy)  Analgesic:Hydrocodone/APAP 5/325 one tablet every 8 hours (15 mg/day of hydrocodone) MME/day:15 mg/day Louann Liv M, RN  08/08/2017  1:16 PM  Signed Nursing Pain Medication Assessment:  Safety precautions to be maintained throughout the outpatient stay will include: orient to surroundings, keep bed in low position, maintain call bell  within reach at all times, provide assistance with transfer out of bed and ambulation.  Medication Inspection Compliance: Pill count conducted under aseptic conditions, in front of the patient. Neither the pills nor the bottle was  removed from the patient's sight at any time. Once count was completed pills were immediately returned to the patient in their original bottle.  Medication #1: Hydrocodone/APAP Pill/Patch Count: 55 of 90 pills remain Pill/Patch Appearance: Markings consistent with prescribed medication Bottle Appearance: Standard pharmacy container. Clearly labeled. Filled Date: 10 /07/ 2018 Last Medication intake:  Yesterday  Medication #2: Oxycodone IR Pill/Patch Count: 7 of 90 pills remain Pill/Patch Appearance: Markings consistent with prescribed medication Bottle Appearance: Standard pharmacy container. Clearly labeled. Filled Date: 34 / 36 / 2018 Last Medication intake:  Yesterday   Pharmacokinetics: Liberation and absorption (onset of action): WNL Distribution (time to peak effect): WNL Metabolism and excretion (duration of action): WNL         Pharmacodynamics: Desired effects: Analgesia: Theresa [name] reports >50% benefit. Functional ability: Patient reports that medication allows her to accomplish basic ADLs Clinically meaningful improvement in function (CMIF): Sustained CMIF goals met Perceived effectiveness: Described as relatively effective, allowing for increase in activities of daily living (ADL) Undesirable effects: Side-effects or Adverse reactions: None reported Monitoring:  PMP: Online review of the past 15-monthperiod conducted. Compliant with practice rules and regulations Last UDS on record: Summary  Date Value Ref Range Status  04/11/2017 FINAL  Final    Comment:    ==================================================================== TOXASSURE SELECT 13 (MW) ==================================================================== Specimen  Alert Note:  Urinary creatinine is low; ability to detect some drugs may be compromised.  Interpret results with caution. ==================================================================== Test                             Result       Flag       Units Drug Present and Declared for Prescription Verification   Hydrocodone                    1156         EXPECTED   ng/mg creat   Norhydrocodone                 1550         EXPECTED   ng/mg creat    Sources of hydrocodone include scheduled prescription    medications. Norhydrocodone is an expected metabolite of    hydrocodone. Drug Absent but Declared for Prescription Verification   Alprazolam                     Not Detected UNEXPECTED ng/mg creat ==================================================================== Test                      Result    Flag   Units      Ref Range   Creatinine              16        L      mg/dL      >=20 ==================================================================== Declared Medications:  The flagging and interpretation on this report are based on the  following declared medications.  Unexpected results may arise from  inaccuracies in the declared medications.  **Note: The testing scope of this panel includes these medications:  Alprazolam  Hydrocodone (Hydrocodone-Acetaminophen)  **Note: The testing scope of this panel does not include following  reported medications:  Acetaminophen (Hydrocodone-Acetaminophen)  Albuterol  Albuterol (Ipratropium-Albuterol)  Bisoprolol  Caffeine (Caffeine/Aspirin)  Cholecalciferol  Fluticasone  Furosemide  Hydrochlorothiazide  Ipratropium (Ipratropium-Albuterol)  Menthol  Nicotine  Oxygen  Potassium  Ranitidine  Roflumilast  Salicylate (Caffeine/Aspirin)  Salmeterol  Sertraline ==================================================================== For clinical consultation, please call [phone]. ====================================================================    UDS interpretation: Compliant          Medication Assessment Form: Reviewed. Patient indicates being compliant with therapy Treatment compliance: Compliant Risk Assessment Profile: Aberrant behavior: See prior evaluations. None observed or detected today Comorbid factors increasing risk of overdose: See prior notes. No additional risks detected today Risk of substance use disorder (SUD): Low     Opioid Risk Tool - 07/27/17 1235      Family History of Substance Abuse   Alcohol Negative   Illegal Drugs Negative   Rx Drugs Negative     Personal History of Substance Abuse   Alcohol Negative   Illegal Drugs Negative   Rx Drugs Negative     Age   Age between 89-45 years  No     History of Preadolescent Sexual Abuse   History of Preadolescent Sexual Abuse Negative or Female     Psychological Disease   Psychological Disease Negative   Depression Positive     Total Score   Opioid Risk Tool Scoring 1   Opioid Risk Interpretation Low Risk     ORT Scoring interpretation table:  Score <3 = Low Risk for SUD  Score between 4-7 = Moderate Risk for SUD  Score >8 = High Risk for Opioid Abuse   Risk Mitigation Strategies:  Patient Counseling: Covered Patient-Prescriber Agreement (PPA): Present and active  Notification to other healthcare providers: Done  Pharmacologic Plan: No change in therapy, at this time  Post-Procedure Assessment  07/27/2017 Procedure: Lumbar Facet Pre-procedure pain score:  3/10 Post-procedure pain score: 0/10         Influential Factors: BMI: 41.88 kg/m Intra-procedural challenges: None observed.         Assessment challenges: None detected.              Reported side-effects: None.        Post-procedural adverse reactions or complications: None reported         Sedation: Please see nurses note. When no sedatives are used, the analgesic levels obtained are directly  associated to the effectiveness of the local anesthetics. However, when sedation is provided, the level of analgesia obtained during the initial 1 hour following the intervention, is believed to be the result of a combination of factors. These factors may include, but are not limited to: 1. The effectiveness of the local anesthetics used. 2. The effects of the analgesic(s) and/or anxiolytic(s) used. 3. The degree of discomfort experienced by the patient at the time of the procedure. 4. The patients ability and reliability in recalling and recording the events. 5. The presence and influence of possible secondary gains and/or psychosocial factors. Reported result: Relief experienced during the 1st hour after the procedure: 100 % (Ultra-Short Term Relief)            Interpretative annotation: Clinically appropriate result. Analgesia during this period is likely to be Local Anesthetic and/or IV Sedative (Analgesic/Anxiolytic) related.          Effects of local anesthetic: The analgesic effects attained during this period are directly associated to the localized infiltration of local anesthetics and therefore cary significant diagnostic value as to the etiological location, or anatomical origin, of the pain. Expected duration of relief is directly dependent on the pharmacodynamics of the local anesthetic used. Long-acting (4-6 hours) anesthetics used.  Reported result: Relief during the next 4 to 6 hour after the  procedure: 100 % (Short-Term Relief)            Interpretative annotation: Clinically appropriate result. Analgesia during this period is likely to be Local Anesthetic-related.          Long-term benefit: Defined as the period of time past the expected duration of local anesthetics (1 hour for short-acting and 4-6 hours for long-acting). With the possible exception of prolonged sympathetic blockade from the local anesthetics, benefits during this period are typically attributed to, or associated  with, other factors such as analgesic sensory neuropraxia, antiinflammatory effects, or beneficial biochemical changes provided by agents other than the local anesthetics.  Reported result: Extended relief following procedure: 75 % (lasting a couple of days) (Long-Term Relief)            Interpretative annotation: Clinically appropriate result. Good relief. No permanent benefit expected. Inflammation plays a part in the etiology to the pain.          Current benefits: Defined as reported results that persistent at this point in time.   Analgesia: <50 %            Function: Somewhat improved ROM: Somewhat improved Interpretative annotation: Recurrence of symptoms. No permanent benefit expected. Results would suggest further treatment needed.          Interpretation: Results would suggest a successful diagnostic intervention. Possible adjustment in plan of care may be required          Plan:  Please see "Plan of Care" for details.        Laboratory Chemistry  Inflammation Markers (CRP: Acute Phase) (ESR: Chronic Phase) Lab Results  Component Value Date   CRP 1.1 (H) 09/17/2015   ESRSEDRATE 5 09/17/2015                 Renal Function Markers Lab Results  Component Value Date   BUN 17 03/02/2017   CREATININE 0.79 03/02/2017   GFRAA 95 03/02/2017   GFRNONAA 82 03/02/2017                 Hepatic Function Markers Lab Results  Component Value Date   AST 12 03/02/2017   ALT 17 03/02/2017   ALBUMIN 4.4 03/02/2017   ALKPHOS 75 03/02/2017                 Electrolytes Lab Results  Component Value Date   NA 141 03/02/2017   K 4.6 03/02/2017   CL 98 03/02/2017   CALCIUM 9.7 03/02/2017   MG 2.1 02/13/2017                 Neuropathy Markers No results found for: [id number]               Bone Pathology Markers Lab Results  Component Value Date   ALKPHOS 75 03/02/2017   25OHVITD1 13 (L) 09/17/2015   25OHVITD2 <1.0 09/17/2015   25OHVITD3 13 09/17/2015   CALCIUM 9.7 03/02/2017                  Coagulation Parameters Lab Results  Component Value Date   PLT 206 03/02/2017                 Cardiovascular Markers Lab Results  Component Value Date   BNP 40.2 05/11/2017   HGB 15.7 03/02/2017   HCT 48.8 (H) 03/02/2017                 Note: Lab results reviewed.  Recent Diagnostic Imaging Results  DG C-Arm 1-60 Min-No Report Fluoroscopy was utilized by the requesting physician.  No radiographic  interpretation.   Complexity Note: Imaging results reviewed. Results shared with Ms. Uselton, using Layman's terms.                         Meds   Current Outpatient Prescriptions:  .  albuterol (PROVENTIL HFA;VENTOLIN HFA) 108 (90 Base) MCG/ACT inhaler, Inhale 2 puffs into the lungs every 6 (six) hours as needed for wheezing. Reported on 11/03/2015, Disp: 1 Inhaler, Rfl: 2 .  albuterol (VENTOLIN HFA) 108 (90 Base) MCG/ACT inhaler, INHALE 2 PUFFS BY MOUTH FOUR TIMES DAILY AS NEEDED, Disp: , Rfl:  .  ALPRAZolam (XANAX) 0.5 MG tablet, Take 1 tablet (0.5 mg total) by mouth 2 (two) times daily as needed for anxiety. (Patient taking differently: Take 0.25 mg by mouth 2 (two) times daily as needed for anxiety. ), Disp: 60 tablet, Rfl: 0 .  Aspirin-Caffeine (BC FAST PAIN RELIEF ARTHRITIS) 1000-65 MG PACK, Take 1 packet by mouth every 6 (six) hours as needed (Pain)., Disp: , Rfl:  .  bisoprolol-hydrochlorothiazide (ZIAC) 10-6.25 MG tablet, TAKE 1 TABLET BY MOUTH EVERY DAY, Disp: 90 tablet, Rfl: 0 .  buPROPion (WELLBUTRIN XL) 150 MG 24 hr tablet, Take 150 mg by mouth daily., Disp: , Rfl:  .  Cholecalciferol ([employer] D3) 2000 units CAPS, Take 1 capsule by mouth daily., Disp: , Rfl:  .  Fluticasone-Salmeterol (ADVAIR DISKUS) 250-50 MCG/DOSE AEPB, INHALE 1 PUFF INTO LUNGS TWICE A DAY, Disp: , Rfl:  .  furosemide (LASIX) 40 MG tablet, Take 40 mg by mouth 2 (two) times daily., Disp: , Rfl:  .  [START ON 08/22/2017] HYDROcodone-acetaminophen (NORCO/VICODIN) 5-325 MG tablet, Take 1 tablet  by mouth every 8 (eight) hours as needed for severe pain., Disp: 90 tablet, Rfl: 0 .  [START ON 09/21/2017] HYDROcodone-acetaminophen (NORCO/VICODIN) 5-325 MG tablet, Take 1 tablet by mouth every 8 (eight) hours as needed for severe pain., Disp: 90 tablet, Rfl: 0 .  ipratropium-albuterol (DUONEB) 0.5-2.5 (3) MG/3ML SOLN, Take 3 mLs by nebulization every 6 (six) hours as needed (shortness of breath)., Disp: 360 mL, Rfl: 3 .  Menthol, Topical Analgesic, (BENGAY COLD THERAPY) 5 % GEL, Apply 1 application topically daily., Disp: , Rfl:  .  OXYGEN, Inhale 2.5 L into the lungs continuous. Reported on 11/03/2015, Disp: , Rfl:  .  potassium chloride SA (K-DUR,KLOR-CON) 20 MEQ tablet, Take 20 mEq by mouth 2 (two) times daily., Disp: , Rfl:  .  ranitidine (ZANTAC) 150 MG tablet, Take 150 mg by mouth 2 (two) times daily., Disp: , Rfl:  .  roflumilast (DALIRESP) 500 MCG TABS tablet, Take 500 mcg by mouth daily., Disp: , Rfl:  .  sertraline (ZOLOFT) 100 MG tablet, TAKE 1.5 TABLET (150 MG TOTAL) BY MOUTH DAILY., Disp: 135 tablet, Rfl: 4 .  traZODone (DESYREL) 50 MG tablet, TK 1 T PO HS PRN, Disp: , Rfl: 0 .  [START ON 10/21/2017] HYDROcodone-acetaminophen (NORCO/VICODIN) 5-325 MG tablet, Take 1 tablet by mouth every 8 (eight) hours as needed for severe pain., Disp: 90 tablet, Rfl: 0  ROS  Constitutional: Denies any fever or chills Gastrointestinal: No reported hemesis, hematochezia, vomiting, or acute GI distress Musculoskeletal: Denies any acute onset joint swelling, redness, loss of ROM, or weakness Neurological: No reported episodes of acute onset apraxia, aphasia, dysarthria, agnosia, amnesia, paralysis, loss of coordination, or loss of consciousness  Allergies  Ms. Landry is allergic to codeine  and meloxicam.  PFSH  Drug: Ms. Beecher  reports that she does not use drugs. Alcohol:  reports that she does not drink alcohol. Tobacco:  reports that she has been smoking Cigarettes.  She has a 40.00 pack-year  smoking history. She has never used smokeless tobacco. Medical:  has a past medical history of Anxiety; Carpal tunnel syndrome; CHF (congestive heart failure) ([place]); CHF (congestive heart failure) ([place]); Chronic generalized abdominal pain; Chronic rhinitis; COPD (chronic obstructive pulmonary disease) ([facility]); DDD (degenerative disc disease), cervical; Depression; Edema; Flu; Gastritis; Hematuria; Hypertension; Kidney stones; Low back pain; Lumbar radiculitis; Malodorous urine; Muscle weakness; Obesity; Renal cyst; Sensory urge incontinence; and Tobacco abuse. Surgical: Ms. Kleinschmidt  has a past surgical history that includes Tubal ligation; Abdominal hysterectomy; Tonsillectomy; and Carpal tunnel release (Left). Family: family history includes Alcohol abuse in her father; Coronary artery disease in her mother; Heart disease in her father and mother; Lung cancer in her sister; Stroke in her mother.  Constitutional Exam  General appearance: Well nourished, well developed, and well hydrated. In no apparent acute distress Vitals:   08/08/17 1303 08/08/17 1304  BP:  126/77  Pulse:  73  Resp:  18  Temp:  98 F (36.7 C)  SpO2:  97%  Weight: 229 lb (103.9 kg)   Height: _0  (1.575 m)    BMI Assessment: Estimated body mass index is 41.88 kg/m as calculated from the following:   Height as of this encounter: _1  (1.575 m).   Weight as of this encounter: 229 lb (103.9 kg).  BMI interpretation table: BMI level Category Range association with higher incidence of chronic pain  <18 kg/m2 Underweight   18.5-24.9 kg/m2 Ideal body weight   25-29.9 kg/m2 Overweight Increased incidence by 20%  30-34.9 kg/m2 Obese (Class I) Increased incidence by 68%  35-39.9 kg/m2 Severe obesity (Class II) Increased incidence by 136%  >40 kg/m2 Extreme obesity (Class III) Increased incidence by 254%   BMI Readings from Last 4 Encounters:  08/08/17 41.88 kg/m  08/08/17 41.93 kg/m  07/27/17 42.62 kg/m  06/27/17 42.62  kg/m   Wt Readings from Last 4 Encounters:  08/08/17 229 lb (103.9 kg)  08/08/17 229 lb 4 oz (104 kg)  07/27/17 233 lb (105.7 kg)  06/27/17 233 lb (105.7 kg)  Psych/Mental status: Alert, oriented x 3 (person, place, & time)       Eyes: PERLA Respiratory: Oxygen-dependent COPD  Cervical Spine Area Exam  Skin & Axial Inspection: No masses, redness, edema, swelling, or associated skin lesions Alignment: Symmetrical Functional ROM: Unrestricted ROM      Stability: No instability detected Muscle Tone/Strength: Functionally intact. No obvious neuro-muscular anomalies detected. Sensory (Neurological): Unimpaired Palpation: No palpable anomalies              Upper Extremity (UE) Exam    Side: Right upper extremity  Side: Left upper extremity  Skin & Extremity Inspection: Skin color, temperature, and hair growth are WNL. No peripheral edema or cyanosis. No masses, redness, swelling, asymmetry, or associated skin lesions. No contractures.  Skin & Extremity Inspection: Skin color, temperature, and hair growth are WNL. No peripheral edema or cyanosis. No masses, redness, swelling, asymmetry, or associated skin lesions. No contractures.  Functional ROM: Unrestricted ROM          Functional ROM: Unrestricted ROM          Muscle Tone/Strength: Functionally intact. No obvious neuro-muscular anomalies detected.  Muscle Tone/Strength: Functionally intact. No obvious neuro-muscular anomalies detected.  Sensory (Neurological):  Unimpaired          Sensory (Neurological): Unimpaired          Palpation: No palpable anomalies              Palpation: No palpable anomalies              Specialized Test(s): Deferred         Specialized Test(s): Deferred          Thoracic Spine Area Exam  Skin & Axial Inspection: No masses, redness, or swelling Alignment: Symmetrical Functional ROM: Unrestricted ROM Stability: No instability detected Muscle Tone/Strength: Functionally intact. No obvious neuro-muscular  anomalies detected. Sensory (Neurological): Unimpaired Muscle strength & Tone: No palpable anomalies  Lumbar Spine Area Exam  Skin & Axial Inspection: No masses, redness, or swelling Alignment: Symmetrical Functional ROM: Unrestricted ROM      Stability: No instability detected Muscle Tone/Strength: Functionally intact. No obvious neuro-muscular anomalies detected. Sensory (Neurological): Unimpaired Palpation: No palpable anomalies       Provocative Tests: Lumbar Hyperextension and rotation test: evaluation deferred today       Lumbar Lateral bending test: evaluation deferred today       Patrick's Maneuver: evaluation deferred today                    Gait & Posture Assessment  Ambulation: Unassisted Gait: Relatively normal for age and body habitus Posture: WNL   Lower Extremity Exam    Side: Right lower extremity  Side: Left lower extremity  Skin & Extremity Inspection: Skin color, temperature, and hair growth are WNL. No peripheral edema or cyanosis. No masses, redness, swelling, asymmetry, or associated skin lesions. No contractures.  Skin & Extremity Inspection: Skin color, temperature, and hair growth are WNL. No peripheral edema or cyanosis. No masses, redness, swelling, asymmetry, or associated skin lesions. No contractures.  Functional ROM: Unrestricted ROM          Functional ROM: Unrestricted ROM          Muscle Tone/Strength: Functionally intact. No obvious neuro-muscular anomalies detected.  Muscle Tone/Strength: Functionally intact. No obvious neuro-muscular anomalies detected.  Sensory (Neurological): Unimpaired  Sensory (Neurological): Unimpaired  Palpation: No palpable anomalies  Palpation: No palpable anomalies   Assessment  Primary Diagnosis & Pertinent Problem List: The primary encounter diagnosis was Chronic low back pain (Location of Primary Source of Pain) (Left). Diagnoses of Chronic hip pain, unspecified laterality, Chronic sacroiliac joint pain (Bilateral),  Chronic pain syndrome, Other long term (current) drug therapy, and Disorder of bone, unspecified were also pertinent to this visit.  Status Diagnosis  Controlled Controlled Controlled 1. Chronic low back pain (Location of Primary Source of Pain) (Left)   2. Chronic hip pain, unspecified laterality   3. Chronic sacroiliac joint pain (Bilateral)   4. Chronic pain syndrome   5. Other long term (current) drug therapy   6. Disorder of bone, unspecified     Problems updated and reviewed during this visit: Problem  Other Long Term (Current) Drug Therapy  Disorder of Bone, Unspecified   Plan of Care  Pharmacotherapy (Medications Ordered): Meds ordered this encounter  Medications  . HYDROcodone-acetaminophen (NORCO/VICODIN) 5-325 MG tablet    Sig: Take 1 tablet by mouth every 8 (eight) hours as needed for severe pain.    Dispense:  90 tablet    Refill:  0    Do not add this medication to the electronic "Automatic Refill" notification system. Patient may have prescription filled one day early  if pharmacy is closed on scheduled refill date. Do not fill until:08/22/2017 To last until: 09/21/2017    Order Specific Question:   Supervising Provider    Answer:   Milinda Pointer [phone]  . HYDROcodone-acetaminophen (NORCO/VICODIN) 5-325 MG tablet    Sig: Take 1 tablet by mouth every 8 (eight) hours as needed for severe pain.    Dispense:  90 tablet    Refill:  0    Do not add this medication to the electronic "Automatic Refill" notification system. Patient may have prescription filled one day early if pharmacy is closed on scheduled refill date. Do not fill until:09/21/2017 To last until: 10/21/2017    Order Specific Question:   Supervising Provider    Answer:   Milinda Pointer [phone]  . HYDROcodone-acetaminophen (NORCO/VICODIN) 5-325 MG tablet    Sig: Take 1 tablet by mouth every 8 (eight) hours as needed for severe pain.    Dispense:  90 tablet    Refill:  0    Do not add this  medication to the electronic "Automatic Refill" notification system. Patient may have prescription filled one day early if pharmacy is closed on scheduled refill date. Do not fill until: 10/21/2017 To last until:11/20/2017    Order Specific Question:   Supervising Provider    Answer:   Milinda Pointer [phone]   New Prescriptions   No medications on file   Medications administered today: Ms. Karwowski had no medications administered during this visit. Lab-work, procedure(s), and/or referral(s): Orders Placed This Encounter  Procedures  . Comp. Metabolic Panel (12)  . C-reactive protein  . Sedimentation rate  . Magnesium  . Vitamin B12   Imaging and/or referral(s): None  Interventional therapies: Planned, scheduled, and/or pending:   Not at this time   C for a onsidering:   Diagnostic bilateral lumbar facet block Possible bilateral lumbar facet RFA   Palliative PRN treatment(s):   Palliative left sided lumbar facetblock      Provider-requested follow-up: Return in about 3 months (around 11/08/2017) for MedMgmt.  Future Appointments Date Time Provider [place]  08/15/2017 10:30 AM Milinda Pointer, MD [facility] None  11/08/2017 1:00 PM [name], FNP [facility] None  11/08/2017 1:45 PM Vevelyn Francois, NP [facility] None  04/10/2018 3:00 PM Harlin Heys, MD [facility] None   Primary Care Physician: Margo Common, PA Location: [facility] Note by: Vevelyn Francois NP Date: 08/08/2017; Time: 3:56 PM  Pain Score Disclaimer: We use the NRS-11 scale. This is a self-reported, subjective measurement of pain severity with only modest accuracy. It is used primarily to identify changes within a particular patient. It must be understood that outpatient pain scales are significantly less accurate that those used for research, where they can be applied under ideal controlled circumstances with minimal exposure to variables. In  reality, the score is likely to be a combination of pain intensity and pain affect, where pain affect describes the degree of emotional arousal or changes in action readiness caused by the sensory experience of pain. Factors such as social and work situation, setting, emotional state, anxiety levels, expectation, and prior pain experience may influence pain perception and show large inter-individual differences that may also be affected by time variables.  Patient instructions provided during this appointment: Patient Instructions  You have been given 3 prescriptions for Hydrocodone today.  ____________________________________________________________________________________________  Medication Rules  Applies to: All patients receiving prescriptions (written or electronic).  Pharmacy of record: Pharmacy where electronic prescriptions will  be sent. If written prescriptions are taken to a different pharmacy, please inform the nursing staff. The pharmacy listed in the electronic medical record should be the one where you would like electronic prescriptions to be sent.  Prescription refills: Only during scheduled appointments. Applies to both, written and electronic prescriptions.  NOTE: The following applies primarily to controlled substances (Opioid* Pain Medications).   Patient's responsibilities: 1. Pain Pills: Bring all pain pills to every appointment (except for procedure appointments). 2. Pill Bottles: Bring pills in original pharmacy bottle. Always bring newest bottle. Bring bottle, even if empty. 3. Medication refills: You are responsible for knowing and keeping track of what medications you need refilled. The day before your appointment, write a list of all prescriptions that need to be refilled. Bring that list to your appointment and give it to the admitting nurse. Prescriptions will be written only during appointments. If you forget a medication, it will not be "Called in", "Faxed", or  "electronically sent". You will need to get another appointment to get these prescribed. 4. Prescription Accuracy: You are responsible for carefully inspecting your prescriptions before leaving our office. Have the discharge nurse carefully go over each prescription with you, before taking them home. Make sure that your name is accurately spelled, that your address is correct. Check the name and dose of your medication to make sure it is accurate. Check the number of pills, and the written instructions to make sure they are clear and accurate. Make sure that you are given enough medication to last until your next medication refill appointment. 5. Taking Medication: Take medication as prescribed. Never take more pills than instructed. Never take medication more frequently than prescribed. Taking less pills or less frequently is permitted and encouraged, when it comes to controlled substances (written prescriptions).  6. Inform other Doctors: Always inform, all of your healthcare providers, of all the medications you take. 7. Pain Medication from other Providers: You are not allowed to accept any additional pain medication from any other Doctor or Healthcare provider. There are two exceptions to this rule. (see below) In the event that you require additional pain medication, you are responsible for notifying us, as stated below. 8. Medication Agreement: You are responsible for carefully reading and following our Medication Agreement. This must be signed before receiving any prescriptions from our practice. Safely store a copy of your signed Agreement. Violations to the Agreement will result in no further prescriptions. (Additional copies of our Medication Agreement are available upon request.) 9. Laws, Rules, & Regulations: All patients are expected to follow all Federal and [employer], Rules, [employer]. Ignorance of the Laws does not constitute a valid excuse. The use of any illegal substances is  prohibited. 10. Adopted CDC guidelines & recommendations: Target dosing levels will be at or below 60 MME/day. Use of benzodiazepines** is not recommended.  Exceptions: There are only two exceptions to the rule of not receiving pain medications from other Healthcare Providers. 1. Exception #1 (Emergencies): In the event of an emergency (i.e.: accident requiring emergency care), you are allowed to receive additional pain medication. However, you are responsible for: As soon as you are able, call our office [phone], at any time of the day or night, and leave a message stating your name, the date and nature of the emergency, and the name and dose of the medication prescribed. In the event that your call is answered by a member of our staff, make sure to document and save the date,  time, and the name of the person that took your information.  2. Exception #2 (Planned Surgery): In the event that you are scheduled by another doctor or dentist to have any type of surgery or procedure, you are allowed (for a period no longer than 30 days), to receive additional pain medication, for the acute post-op pain. However, in this case, you are responsible for picking up a copy of our "Post-op Pain Management for Surgeons" handout, and giving it to your surgeon or dentist. This document is available at our office, and does not require an appointment to obtain it. Simply go to our office during business hours (Monday-Thursday from 8:00 AM to 4:00 PM) (Friday 8:00 AM to 12:00 Noon) or if you have a scheduled appointment with [place], prior to your surgery, and ask for it by name. In addition, you will need to provide [place] with your name, name of your surgeon, type of surgery, and date of procedure or surgery.  *Opioid medications include: morphine, codeine, oxycodone, oxymorphone, hydrocodone, hydromorphone, meperidine, tramadol, tapentadol, buprenorphine, fentanyl, methadone. **Benzodiazepine medications include: diazepam  (Valium), alprazolam (Xanax), clonazepam (Klonopine), lorazepam (Ativan), clorazepate (Tranxene), chlordiazepoxide (Librium), estazolam (Prosom), oxazepam (Serax), temazepam (Restoril), triazolam (Halcion)  ____________________________________________________________________________________________

## 2017-08-08 NOTE — Progress Notes (Signed)
Nursing Pain Medication Assessment:  Safety precautions to be maintained throughout the outpatient stay will include: orient to surroundings, keep bed in low position, maintain call bell within reach at all times, provide assistance with transfer out of bed and ambulation.  Medication Inspection Compliance: Pill count conducted under aseptic conditions, in front of the patient. Neither the pills nor the bottle was removed from the patient's sight at any time. Once count was completed pills were immediately returned to the patient in their original bottle.  Medication #1: Hydrocodone/APAP Pill/Patch Count: 55 of 90 pills remain Pill/Patch Appearance: Markings consistent with prescribed medication Bottle Appearance: Standard pharmacy container. Clearly labeled. Filled Date: 10 /07/ 2018 Last Medication intake:  Yesterday  Medication #2: Oxycodone IR Pill/Patch Count: 7 of 90 pills remain Pill/Patch Appearance: Markings consistent with prescribed medication Bottle Appearance: Standard pharmacy container. Clearly labeled. Filled Date: 52 / 46 / 2018 Last Medication intake:  Yesterday

## 2017-08-08 NOTE — Telephone Encounter (Signed)
DR. Dossie Arbour ordered a right side RFA on this patient on 06/27/17. I spoke with Dr. Dossie Arbour regarding insurance guidelines and the patient must have one more facet block on the right side before they will approve an RF. HE told me I could go ahead and schedule the patient for another right side facet. With the patients agreement, the facet block was scheduled for 07/27/17. Instead of the right facet being done, which was documented in the appointment notes, the left side was done. We are at a point that I cannot request a right side RFA for this patient because she still has not had 2 right side facets. I think the doctor or a nurse should call the patient regarding this.  Please advise on what I am supposed to do from here. Thanks

## 2017-08-09 LAB — COMP. METABOLIC PANEL (12)
AST: 18 IU/L (ref 0–40)
Albumin/Globulin Ratio: 1.8 (ref 1.2–2.2)
Albumin: 4.6 g/dL (ref 3.6–4.8)
Alkaline Phosphatase: 113 IU/L (ref 39–117)
BUN/Creatinine Ratio: 29 — ABNORMAL HIGH (ref 12–28)
BUN: 21 mg/dL (ref 8–27)
Bilirubin Total: 0.4 mg/dL (ref 0.0–1.2)
Calcium: 9.9 mg/dL (ref 8.7–10.3)
Chloride: 97 mmol/L (ref 96–106)
Creatinine, Ser: 0.72 mg/dL (ref 0.57–1.00)
GFR calc Af Amer: 105 mL/min/{1.73_m2} (ref >59)
GFR calc non Af Amer: 91 mL/min/{1.73_m2} (ref >59)
Globulin, Total: 2.6 g/dL (ref 1.5–4.5)
Glucose: 96 mg/dL (ref 65–99)
Potassium: 4.1 mmol/L (ref 3.5–5.2)
Sodium: 142 mmol/L (ref 134–144)
Total Protein: 7.2 g/dL (ref 6.0–8.5)

## 2017-08-09 LAB — SEDIMENTATION RATE: Sed Rate: 16 mm/hr (ref 0–40)

## 2017-08-09 LAB — MAGNESIUM: Magnesium: 2.1 mg/dL (ref 1.6–2.3)

## 2017-08-09 LAB — C-REACTIVE PROTEIN: CRP: 9.1 mg/L — ABNORMAL HIGH (ref 0.0–4.9)

## 2017-08-09 LAB — VITAMIN B12: Vitamin B-12: 501 pg/mL (ref 232–1245)

## 2017-08-10 ENCOUNTER — Ambulatory Visit: Payer: Medicare PPO | Admitting: Nurse Practitioner

## 2017-08-11 DIAGNOSIS — J449 Chronic obstructive pulmonary disease, unspecified: Secondary | ICD-10-CM | POA: Insufficient documentation

## 2017-08-15 ENCOUNTER — Ambulatory Visit: Payer: Self-pay | Admitting: Pain Medicine

## 2017-08-17 ENCOUNTER — Telehealth: Payer: Self-pay | Admitting: *Deleted

## 2017-08-17 NOTE — Telephone Encounter (Signed)
Spoke to patient, she is calling about post RF Rx that was written on 06/27/17 to fill on that day to last until 07/07/17 for qty of 120.  Pharmacy filled for qty 90 and now Rx is void.  I did discuss with patient that based on the fill dates the qty should have only been qty 45.  Will discuss with [place] and call her back with more information.

## 2017-08-17 NOTE — Telephone Encounter (Signed)
Called [place] back to let her know that she had gotten more medication than she should have gotten based on the dates to fill.  Patient states that pain is better but some days it is bad.  Offered patient to come in for office visit today to evaluate the pain.  Patient state she missed appt earlier this week because she didn't have copay.  Patient will wait the weekend and if she feels she needs to be evaluated she will call us back next week.

## 2017-08-17 NOTE — Telephone Encounter (Signed)
There is nothing else to be done. If she needs to have a follow appointment please have her come in.  We are no longer to give 30 days of medication for acute pain for any reason

## 2017-08-24 ENCOUNTER — Ambulatory Visit (INDEPENDENT_AMBULATORY_CARE_PROVIDER_SITE_OTHER): Payer: Medicare PPO

## 2017-08-24 DIAGNOSIS — Z23 Encounter for immunization: Secondary | ICD-10-CM | POA: Diagnosis not present

## 2017-08-28 IMAGING — DX DG CHEST 1V
1 series · 1 of 1 positions shown · non-contrast
Comparison: 10/28/2016

CLINICAL DATA: Shortness of breath and chest pain

EXAM:
CHEST 1 VIEW

[chest ap]
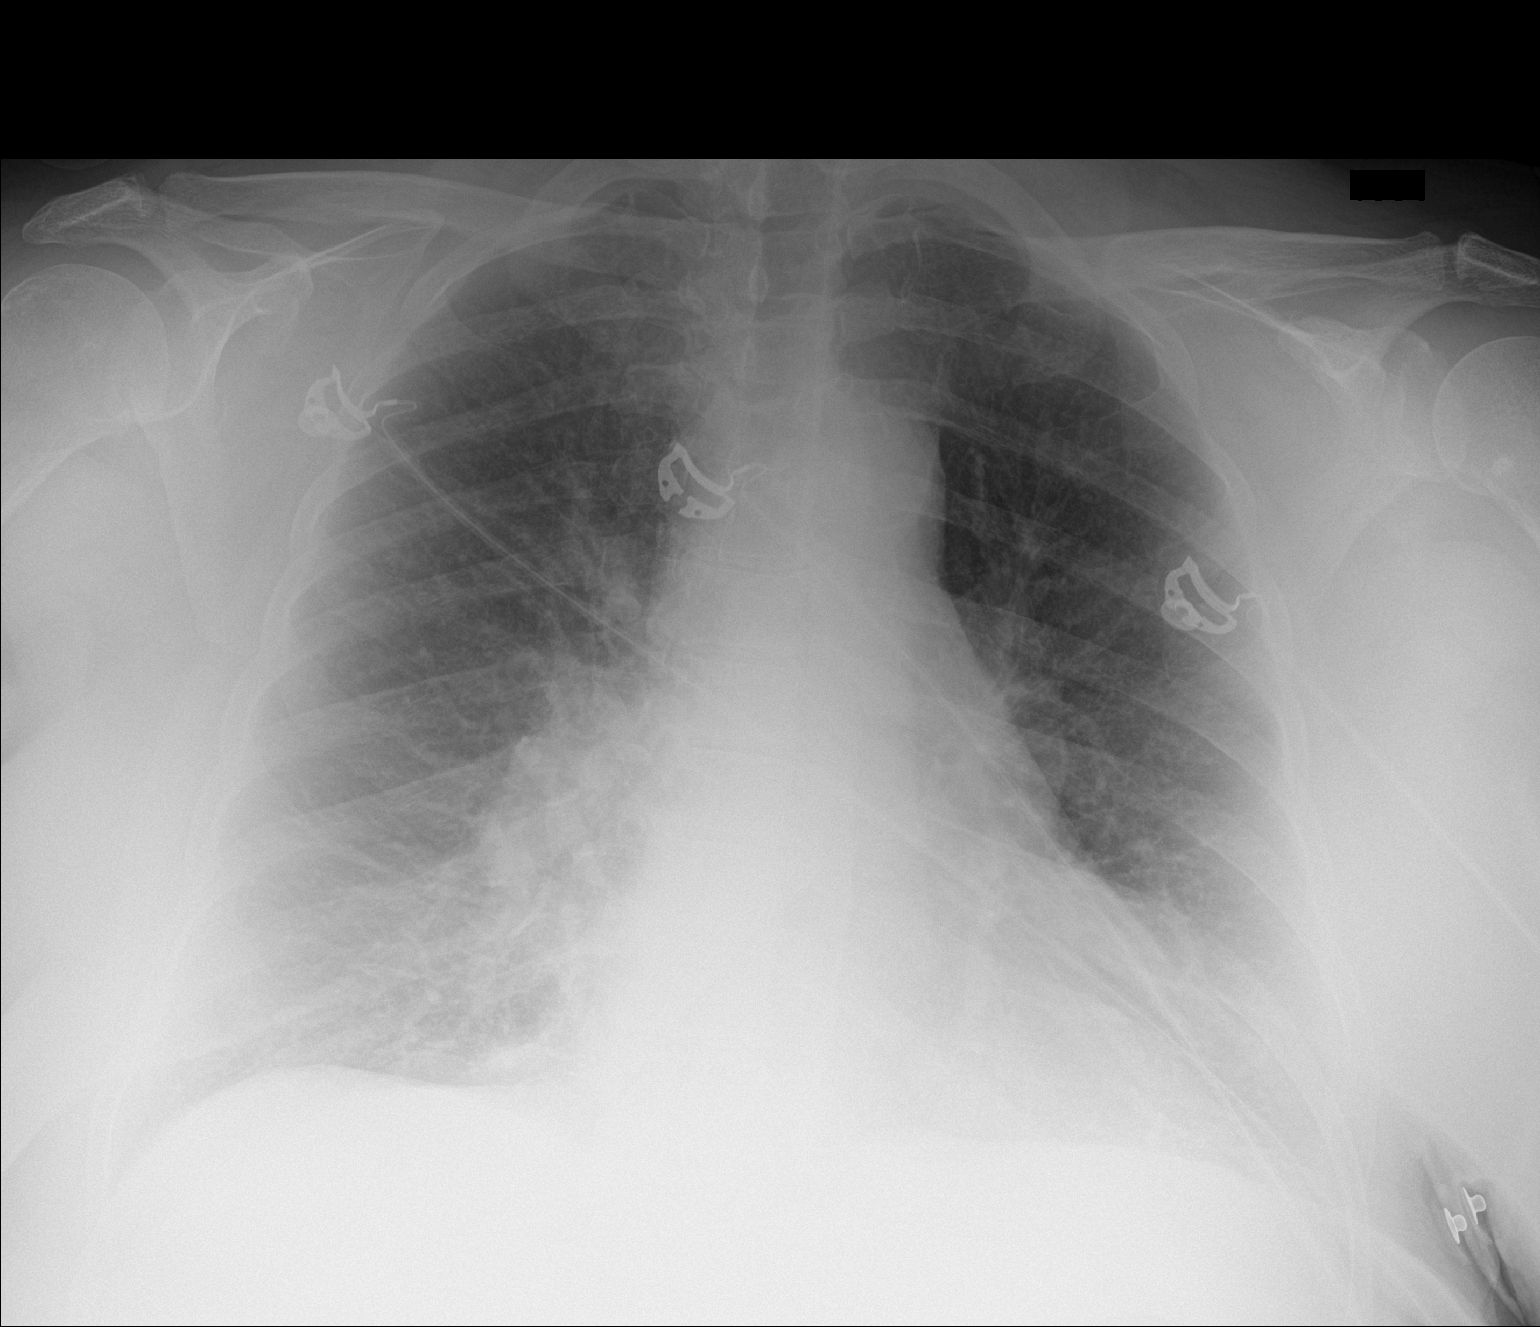

[1 of 1 positions shown; findings below may reference images not displayed]

FINDINGS: Chronic cardiomegaly and pulmonary artery enlargement, best seen at
the [facility] and confirmed on October 2016 CT. Mild atelectasis or
scarring bases. Streaky opacities on prior have improved. Chronic
hyperinflation. No air bronchogram or pneumothorax.
IMPRESSION: 1. No acute finding when compared to prior.
2. COPD and mild lung scarring.
3. Chronic cardiomegaly and pulmonary artery enlargement.

## 2017-08-29 IMAGING — DX DG CHEST 1V PORT
2 series · 2 of 2 positions shown · non-contrast
Comparison: 02/13/2017, 11/02/2015.  He chest 10/24/2016.

CLINICAL DATA: Respiratory failure.

EXAM:
PORTABLE CHEST 1 VIEW

[chest ap (1 of 2)]
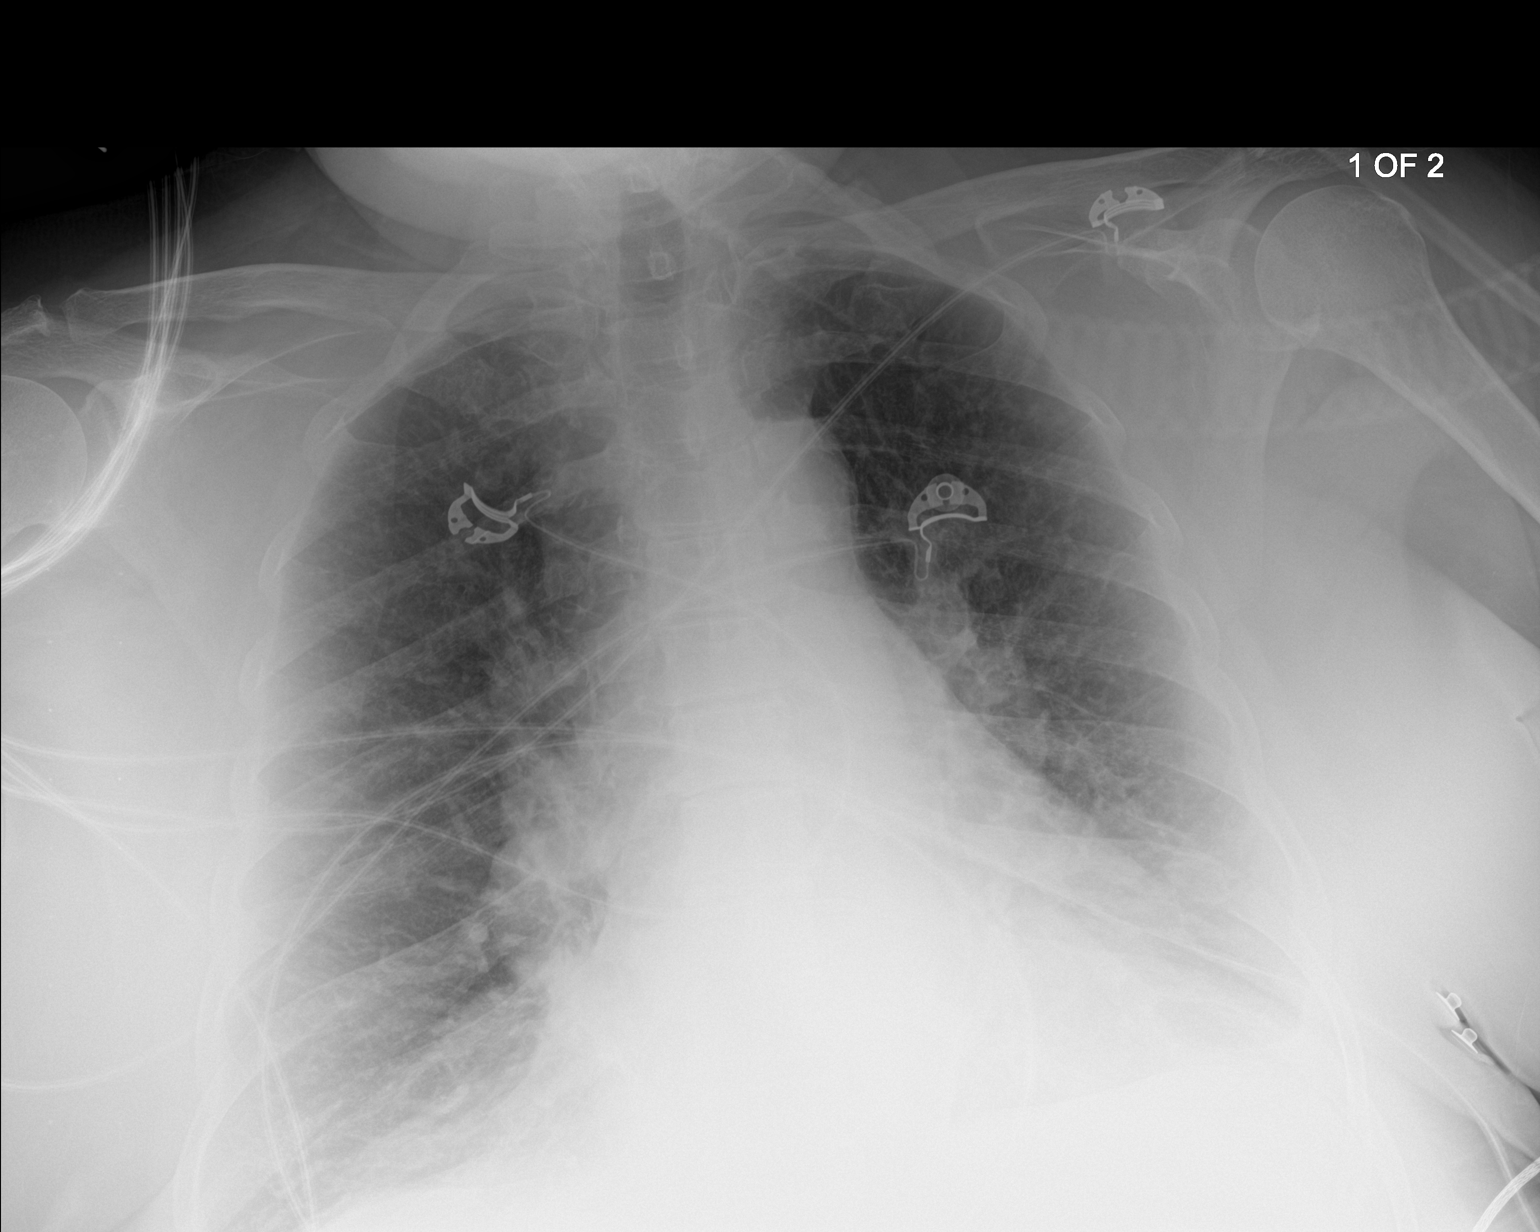

[chest ap (2 of 2)]
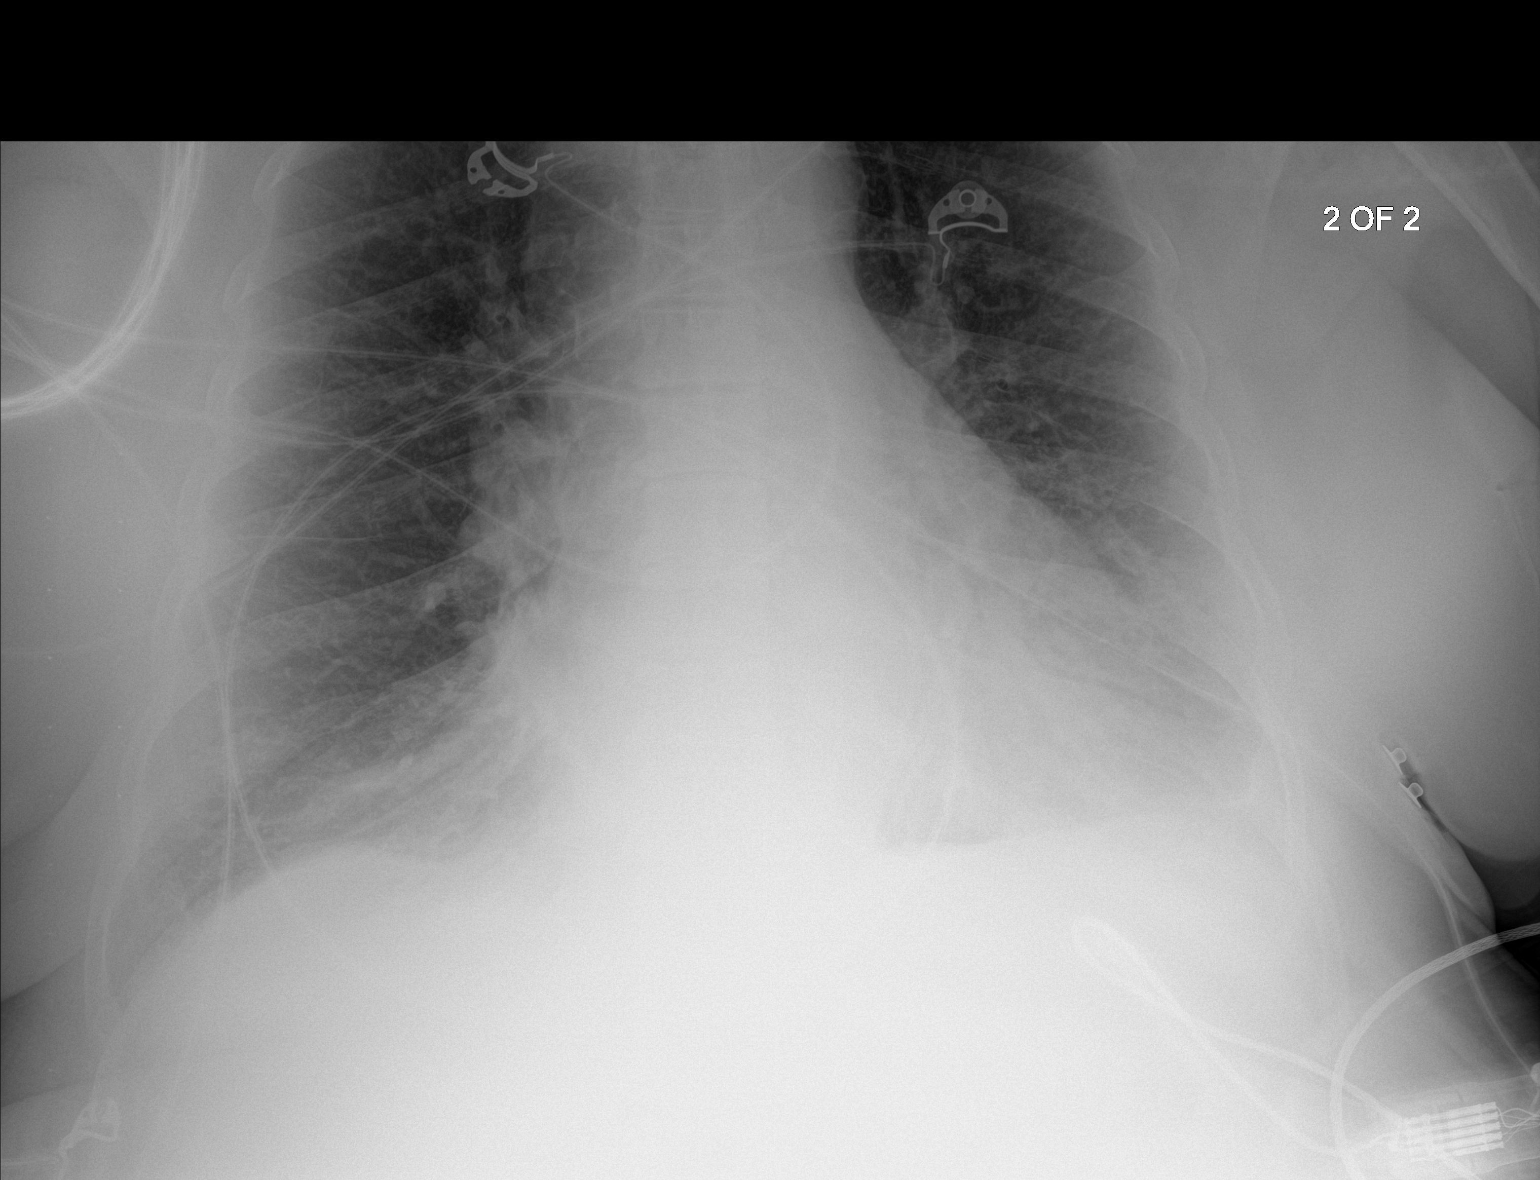

[2 of 2 positions shown; findings below may reference images not displayed]

FINDINGS: Mediastinum and hilar structures are normal. Stable cardiomegaly.
Prominent central pulmonary arteries are again noted consistent
pulmonary hypertension. No pulmonary venous congestion. Persistent
low lung volumes. Mild bibasilar infiltrates cannot be excluded on
today's exam. Small left pleural effusion cannot be excluded on
today's exam. No pneumothorax.
IMPRESSION: 1. Stable cardiomegaly. Prominent central pulmonary arteries again
noted consistent with pulmonary hypertension. No pulmonary venous
congestion.

2. Persistent low lung volumes. Mild bibasilar infiltrates and small
left pleural effusion cannot be excluded on today's exam.

## 2017-09-28 ENCOUNTER — Ambulatory Visit: Payer: Medicare PPO | Admitting: Pain Medicine

## 2017-10-03 ENCOUNTER — Ambulatory Visit
Admission: RE | Admit: 2017-10-03 | Discharge: 2017-10-03 | Disposition: A | Discharge status: Home / Self Care | Payer: Medicare PPO | Source: Ambulatory Visit | Attending: Pain Medicine | Admitting: Pain Medicine

## 2017-10-03 ENCOUNTER — Encounter: Payer: Self-pay | Admitting: Pain Medicine

## 2017-10-03 ENCOUNTER — Other Ambulatory Visit: Payer: Self-pay

## 2017-10-03 ENCOUNTER — Ambulatory Visit (HOSPITAL_BASED_OUTPATIENT_CLINIC_OR_DEPARTMENT_OTHER): Payer: Medicare PPO | Admitting: Pain Medicine

## 2017-10-03 ENCOUNTER — Other Ambulatory Visit: Payer: Self-pay | Admitting: *Deleted

## 2017-10-03 VITALS — BP 110/76 | HR 70 | Temp 97.5°F | Resp 18 | Ht 63.0 in | Wt 238.0 lb

## 2017-10-03 DIAGNOSIS — M545 Low back pain: Secondary | ICD-10-CM | POA: Insufficient documentation

## 2017-10-03 DIAGNOSIS — M792 Neuralgia and neuritis, unspecified: Secondary | ICD-10-CM

## 2017-10-03 DIAGNOSIS — Z886 Allergy status to analgesic agent status: Secondary | ICD-10-CM | POA: Diagnosis not present

## 2017-10-03 DIAGNOSIS — Z885 Allergy status to narcotic agent status: Secondary | ICD-10-CM | POA: Diagnosis not present

## 2017-10-03 DIAGNOSIS — Z7951 Long term (current) use of inhaled steroids: Secondary | ICD-10-CM | POA: Diagnosis not present

## 2017-10-03 DIAGNOSIS — Z90710 Acquired absence of both cervix and uterus: Secondary | ICD-10-CM | POA: Insufficient documentation

## 2017-10-03 DIAGNOSIS — Z79891 Long term (current) use of opiate analgesic: Secondary | ICD-10-CM | POA: Insufficient documentation

## 2017-10-03 DIAGNOSIS — G8918 Other acute postprocedural pain: Secondary | ICD-10-CM | POA: Insufficient documentation

## 2017-10-03 DIAGNOSIS — M47816 Spondylosis without myelopathy or radiculopathy, lumbar region: Secondary | ICD-10-CM

## 2017-10-03 DIAGNOSIS — G629 Polyneuropathy, unspecified: Secondary | ICD-10-CM | POA: Diagnosis not present

## 2017-10-03 DIAGNOSIS — F419 Anxiety disorder, unspecified: Secondary | ICD-10-CM | POA: Diagnosis not present

## 2017-10-03 DIAGNOSIS — Z79899 Other long term (current) drug therapy: Secondary | ICD-10-CM | POA: Diagnosis not present

## 2017-10-03 DIAGNOSIS — G8929 Other chronic pain: Secondary | ICD-10-CM

## 2017-10-03 DIAGNOSIS — M488X6 Other specified spondylopathies, lumbar region: Secondary | ICD-10-CM | POA: Insufficient documentation

## 2017-10-03 HISTORY — DX: Other acute postprocedural pain: G89.18

## 2017-10-03 MED ORDER — GABAPENTIN 100 MG PO CAPS: 100.0000 mg | ORAL_CAPSULE | Freq: Every day | ORAL | 1 refills | Status: DC

## 2017-10-03 MED ORDER — ROPIVACAINE HCL 2 MG/ML IJ SOLN
9.0000 mL | Freq: Once | INTRAMUSCULAR | Status: AC
  Administered 2017-10-03: 9 mL via PERINEURAL
  Filled 2017-10-03: qty 10

## 2017-10-03 MED ORDER — POTASSIUM CHLORIDE CRYS ER 20 MEQ PO TBCR: 20.0000 meq | EXTENDED_RELEASE_TABLET | Freq: Two times a day (BID) | ORAL | 3 refills | Status: DC

## 2017-10-03 MED ORDER — TRIAMCINOLONE ACETONIDE 40 MG/ML IJ SUSP
40.0000 mg | Freq: Once | INTRAMUSCULAR | Status: AC
  Administered 2017-10-03: 40 mg
  Filled 2017-10-03: qty 1

## 2017-10-03 MED ORDER — LIDOCAINE HCL 2 % IJ SOLN
10.0000 mL | Freq: Once | INTRAMUSCULAR | Status: AC
  Administered 2017-10-03: 400 mg
  Filled 2017-10-03: qty 40

## 2017-10-03 MED ORDER — FENTANYL CITRATE (PF) 100 MCG/2ML IJ SOLN
25.0000 ug | INTRAMUSCULAR | Status: DC | PRN
  Administered 2017-10-03: 50 ug via INTRAVENOUS
  Filled 2017-10-03: qty 2

## 2017-10-03 MED ORDER — LACTATED RINGERS IV SOLN
1000.0000 mL | Freq: Once | INTRAVENOUS | Status: AC
  Administered 2017-10-03: 1000 mL via INTRAVENOUS

## 2017-10-03 MED ORDER — OXYCODONE-ACETAMINOPHEN 5-325 MG PO TABS: 1.0000 | ORAL_TABLET | Freq: Three times a day (TID) | ORAL | 0 refills | Status: AC | PRN

## 2017-10-03 MED ORDER — FUROSEMIDE 40 MG PO TABS: 40.0000 mg | ORAL_TABLET | Freq: Two times a day (BID) | ORAL | 3 refills | Status: DC

## 2017-10-03 MED ORDER — MIDAZOLAM HCL 5 MG/5ML IJ SOLN
1.0000 mg | INTRAMUSCULAR | Status: DC | PRN
  Administered 2017-10-03: 2 mg via INTRAVENOUS
  Filled 2017-10-03: qty 5

## 2017-10-03 NOTE — Progress Notes (Signed)
Patient's Name: Theresa [name]  MRN: [id number]  Referring Provider: Milinda Pointer, MD  DOB: August 24, 1956  PCP: Margo Common, PA  DOS: 10/03/2017  Note by: Gaspar Cola, MD  Service setting: Ambulatory outpatient  Specialty: Interventional Pain Management  Patient type: Established  Location: [facility] (AMB) Pain Management Facility  Visit type: Interventional Procedure   Primary Reason for Visit: Interventional Pain Management Treatment. CC: Back Pain (lower)  Procedure:  Anesthesia, Analgesia, Anxiolysis:  Type: Therapeutic Medial Branch Facet Radiofrequency Ablation Region: Lumbar Level: L2, L3, L4, L5, & S1 Medial Branch Level(s) Laterality: Right-Sided  Type: Local Anesthesia with Moderate (Conscious) Sedation Local Anesthetic: Lidocaine 1% Route: Intravenous (IV) IV Access: Secured Sedation: Meaningful verbal contact was maintained at all times during the procedure  Indication(s): Analgesia and Anxiety   Indications: 1. Lumbar facet syndrome (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)   2. Lumbar facet hypertrophy (multilevel)   3. Chronic low back pain (Location of Primary Source of Pain) (Left)   4. Lumbar spondylosis (L4-5)   5. Acute postoperative pain   6. Facet syndrome, lumbar   7. Facet hypertrophy of lumbar region   8. Chronic bilateral low back pain without sciatica   9. Neuropathic pain    Ms. Mette has either failed to respond, was unable to tolerate, or simply did not get enough benefit from other more conservative therapies including, but not limited to: 1. Over-the-counter medications 2. Anti-inflammatory medications 3. Muscle relaxants 4. Membrane stabilizers 5. Opioids 6. Physical therapy 7. Modalities (Heat, ice, etc.) 8. Invasive techniques such as nerve blocks. Ms. Veach has attained more than 50% relief of the pain from a series of diagnostic injections conducted in separate occasions.  Pain Score: Pre-procedure: 5 /10 Post-procedure: 5 (pt was cramping in  right hip now burning to foot when sittin)/10  Pre-op Assessment:  Ms. Blethen is a 61 y.o. (year old), female patient, seen today for interventional treatment. She  has a past surgical history that includes Tubal ligation; Abdominal hysterectomy; Tonsillectomy; and Carpal tunnel release (Left). Ms. Dais has a current medication list which includes the following prescription(s): albuterol, albuterol, alprazolam, aspirin-caffeine, bisoprolol-hydrochlorothiazide, cholecalciferol, fluticasone-salmeterol, hydrocodone-acetaminophen, hydrocodone-acetaminophen, ipratropium-albuterol, oxygen-helium, ranitidine, roflumilast, sertraline, trazodone, bupropion, furosemide, gabapentin, hydrocodone-acetaminophen, menthol (topical analgesic), oxycodone-acetaminophen, and potassium chloride sa, and the following Facility-Administered Medications: fentanyl and midazolam. Her primarily concern today is the Back Pain (lower)  Initial Vital Signs: There were no vitals taken for this visit. BMI: Estimated body mass index is 42.16 kg/m as calculated from the following:   Height as of this encounter: _0  (1.6 m).   Weight as of this encounter: 238 lb (108 kg).  Risk Assessment: Allergies: Reviewed. She is allergic to codeine and meloxicam.  Allergy Precautions: None required Coagulopathies: Reviewed. None identified.  Blood-thinner therapy: None at this time Active Infection(s): Reviewed. None identified. Ms. Fogg is afebrile  Site Confirmation: Ms. Branson was asked to confirm the procedure and laterality before marking the site Procedure checklist: Completed Consent: Before the procedure and under the influence of no sedative(s), amnesic(s), or anxiolytics, the patient was informed of the treatment options, risks and possible complications. To fulfill our ethical and legal obligations, as recommended by the American Medical Association's Code of Ethics, I have informed the patient of my clinical impression;  the nature and purpose of the treatment or procedure; the risks, benefits, and possible complications of the intervention; the alternatives, including doing nothing; the risk(s) and benefit(s) of the alternative treatment(s) or procedure(s); and the risk(s) and benefit(s) of doing nothing.  The patient was provided information about the general risks and possible complications associated with the procedure. These may include, but are not limited to: failure to achieve desired goals, infection, bleeding, organ or nerve damage, allergic reactions, paralysis, and death. In addition, the patient was informed of those risks and complications associated to Spine-related procedures, such as failure to decrease pain; infection (i.e.: Meningitis, epidural or intraspinal abscess); bleeding (i.e.: epidural hematoma, subarachnoid hemorrhage, or any other type of intraspinal or peri-dural bleeding); organ or nerve damage (i.e.: Any type of peripheral nerve, nerve root, or spinal cord injury) with subsequent damage to sensory, motor, and/or autonomic systems, resulting in permanent pain, numbness, and/or weakness of one or several areas of the body; allergic reactions; (i.e.: anaphylactic reaction); and/or death. Furthermore, the patient was informed of those risks and complications associated with the medications. These include, but are not limited to: allergic reactions (i.e.: anaphylactic or anaphylactoid reaction(s)); adrenal axis suppression; blood sugar elevation that in diabetics may result in ketoacidosis or comma; water retention that in patients with history of congestive heart failure may result in shortness of breath, pulmonary edema, and decompensation with resultant heart failure; weight gain; swelling or edema; medication-induced neural toxicity; particulate matter embolism and blood vessel occlusion with resultant organ, and/or nervous system infarction; and/or aseptic necrosis of one or more joints. Finally,  the patient was informed that Medicine is not an exact science; therefore, there is also the possibility of unforeseen or unpredictable risks and/or possible complications that may result in a catastrophic outcome. The patient indicated having understood very clearly. We have given the patient no guarantees and we have made no promises. Enough time was given to the patient to ask questions, all of which were answered to the patient's satisfaction. Ms. Kerins has indicated that she wanted to continue with the procedure. Attestation: I, the ordering provider, attest that I have discussed with the patient the benefits, risks, side-effects, alternatives, likelihood of achieving goals, and potential problems during recovery for the procedure that I have provided informed consent. Date: 10/03/2017; Time: 6:46 AM  Pre-Procedure Preparation:  Monitoring: As per clinic protocol. Respiration, ETCO2, SpO2, BP, heart rate and rhythm monitor placed and checked for adequate function Safety Precautions: Patient was assessed for positional comfort and pressure points before starting the procedure. Time-out: I initiated and conducted the "Time-out" before starting the procedure, as per protocol. The patient was asked to participate by confirming the accuracy of the "Time Out" information. Verification of the correct person, site, and procedure were performed and confirmed by me, the nursing staff, and the patient. "Time-out" conducted as per Joint Commission's Universal Protocol (UP.01.01.01). "Time-out" Date & Time: 10/03/2017; 0937 hrs.  Description of Procedure Process:   Position: Prone Target Area: For Lumbar Facet blocks, the target is the groove formed by the junction of the transverse process and superior articular process. For the L5 dorsal ramus, the target is the notch between superior articular process and sacral ala. For the S1 dorsal ramus, the target is the superior and lateral edge of the posterior S1  Sacral foramen. Approach: Paraspinal approach. Area Prepped: Entire Posterior Lumbosacral Region Prepping solution: Hibiclens (4.0% Chlorhexidine gluconate solution) Safety Precautions: Aspiration looking for blood return was conducted prior to all injections. At no point did we inject any substances, as a needle was being advanced. No attempts were made at seeking any paresthesias. Safe injection practices and needle disposal techniques used. Medications properly checked for expiration dates. SDV (single dose vial) medications used. Description of  the Procedure: Protocol guidelines were followed. The patient was placed in position over the procedure table. The target area was identified and the area prepped in the usual manner. The skin and muscle were infiltrated with local anesthetic. Appropriate amount of time allowed to pass for local anesthetics to take effect. Radiofrequency needles were introduced to the target area using fluoroscopic guidance. Using the NeuroTherm NT1100 Radiofrequency Generator, sensory stimulation using 50 Hz was used to locate & identify the nerve, making sure that the needle was positioned such that there was no sensory stimulation below 0.3 V or above 0.7 V. Stimulation using 2 Hz was used to evaluate the motor component. Care was taken not to lesion any nerves that demonstrated motor stimulation of the lower extremities at an output of less than 2.5 times that of the sensory threshold, or a maximum of 2.0 V. Once satisfactory placement of the needles was achieved, the numbing solution was slowly injected after negative aspiration. After waiting for at least 2 minutes, the ablation was performed at 80 degrees C for 60 seconds, using regular Radiofrequency settings. Once the procedure was completed, the needles were then removed and the area cleansed, making sure to leave some of the prepping solution back to take advantage of its long term bactericidal properties. Intra-operative  Compliance: Compliant  Illustration of the posterior view of the lumbar spine and the posterior neural structures. Laminae of L2 through S1 are labeled. DPRL5, dorsal primary ramus of L5; DPRS1, dorsal primary ramus of S1; DPR3, dorsal primary ramus of L3; FJ, facet (zygapophyseal) joint L3-L4; I, inferior articular process of L4; LB1, lateral branch of dorsal primary ramus of L1; IAB, inferior articular branches from L3 medial branch (supplies L4-L5 facet joint); IBP, intermediate branch plexus; MB3, medial branch of dorsal primary ramus of L3; NR3, third lumbar nerve root; S, superior articular process of L5; SAB, superior articular branches from L4 (supplies L4-5 facet joint also); TP3, transverse process of L3.  Vitals:   10/03/17 1015 10/03/17 1025 10/03/17 1035 10/03/17 1044  BP: 122/79 114/78 102/74 110/76  Pulse:      Resp: _0 Temp:  (!) 97.5 F (36.4 C)    TempSrc:  Temporal    SpO2: 98% 96% 97% 98%  Weight:      Height:        Start Time: 0939 hrs. End Time: 1014 hrs. Materials & Medications:  Needle(s) Type: Teflon-coated, curved tip, Radiofrequency needle(s) Gauge: 20G Length: 15cm Medication(s): We administered lactated ringers, midazolam, fentaNYL, lidocaine, triamcinolone acetonide, and ropivacaine (PF) 2 mg/mL (0.2%). Please see chart orders for dosing details.  Imaging Guidance (Spinal):  Type of Imaging Technique: Fluoroscopy Guidance (Spinal) Indication(s): Assistance in needle guidance and placement for procedures requiring needle placement in or near specific anatomical locations not easily accessible without such assistance. Exposure Time: Please see nurses notes. Contrast: None used. Fluoroscopic Guidance: I was personally present during the use of fluoroscopy. "Tunnel Vision Technique" used to obtain the best possible view of the target area. Parallax error corrected before commencing the procedure. "Direction-depth-direction" technique used to introduce  the needle under continuous pulsed fluoroscopy. Once target was reached, antero-posterior, oblique, and lateral fluoroscopic projection used confirm needle placement in all planes. Images permanently stored in EMR. Interpretation: No contrast injected. I personally interpreted the imaging intraoperatively. Adequate needle placement confirmed in multiple planes. Permanent images saved into the patient's record.  Antibiotic Prophylaxis:  Indication(s): None identified Antibiotic given: None  Post-operative Assessment:  EBL: None  Complications: No immediate post-treatment complications observed by team, or reported by patient. After the procedure, the patient did develop a burning sensation over her right leg. I have provided the patient with Neurontin as an option to treat this. Unfortunately, there is the possibility that her lack of compliance intraoperatively with regards to her movement, might have caused the shifting of the needles from their original location to one closer to the nerve root. Note: The patient tolerated the entire procedure well. A repeat set of vitals were taken after the procedure and the patient was kept under observation following institutional policy, for this type of procedure. Post-procedural neurological assessment was performed, showing return to baseline, prior to discharge. The patient was provided with post-procedure discharge instructions, including a section on how to identify potential problems. Should any problems arise concerning this procedure, the patient was given instructions to immediately contact us, at any time, without hesitation. In any case, we plan to contact the patient by telephone for a follow-up status report regarding this interventional procedure. Comments:  This case was extremely difficult from the technical standpoint due to the patient's COPD, and the patient's noncompliance with regards to interrupt procedure all movement. The patient did lift her  head several times after we had numbed the medial branch and she also lifted her legs causing contraction of the paravertebral muscles and very likely causing the movement of needles from the original location.  Plan of Care   Possible POC:  This patient is no longer an adequate candidate for radiofrequency due to her lack of compliance with interrupt procedural instructions, as well as her older medical problems, such as her COPD that makes it difficult for her to lay still.    Imaging Orders     DG C-Arm 1-60 Min-No Report  Procedure Orders     Radiofrequency,Lumbar  Medications ordered for procedure: Meds ordered this encounter  Medications  . lactated ringers infusion 1,000 mL  . midazolam (VERSED) 5 MG/5ML injection 1-2 mg    Make sure Flumazenil is available in the pyxis when using this medication. If oversedation occurs, administer 0.2 mg IV over 15 sec. If after 45 sec no response, administer 0.2 mg again over 1 min; may repeat at 1 min intervals; not to exceed 4 doses (1 mg)  . fentaNYL (SUBLIMAZE) injection 25-50 mcg    Make sure Narcan is available in the pyxis when using this medication. In the event of respiratory depression (RR< 8/min): Titrate NARCAN (naloxone) in increments of 0.1 to 0.2 mg IV at 2-3 minute intervals, until desired degree of reversal.  . lidocaine (XYLOCAINE) 2 % (with pres) injection 200 mg  . triamcinolone acetonide (KENALOG-40) injection 40 mg  . ropivacaine (PF) 2 mg/mL (0.2%) (NAROPIN) injection 9 mL  . oxyCODONE-acetaminophen (PERCOCET) 5-325 MG tablet    Sig: Take 1 tablet by mouth every 8 (eight) hours as needed for up to 7 days for severe pain.    Dispense:  21 tablet    Refill:  0    For acute post-operative pain. Not to be refilled. To last 7 days.  Marland Kitchen gabapentin (NEURONTIN) 100 MG capsule    Sig: Take 1-3 capsules (100-300 mg total) by mouth at bedtime. Follow written titration schedule.    Dispense:  90 capsule    Refill:  1    Do not  place medication on "Automatic Refill". Fill one day early if pharmacy is closed on scheduled refill date.   Medications administered: We administered lactated ringers,  midazolam, fentaNYL, lidocaine, triamcinolone acetonide, and ropivacaine (PF) 2 mg/mL (0.2%).  See the medical record for exact dosing, route, and time of administration.  This SmartLink is deprecated. Use AVSMEDLIST instead to display the medication list for a patient. Disposition: Discharge home  Discharge Date & Time: 10/03/2017;   hrs.   Physician-requested Follow-up: Return for Post-RFA eval in 6 wks, w/ [name], NP. Future Appointments  Date Time Provider [place]  11/08/2017  1:00 PM [name], FNP [facility] None  11/08/2017  1:45 PM Vevelyn Francois, NP [facility] None  04/10/2018  3:00 PM Harlin Heys, MD [facility] None   Primary Care Physician: Margo Common, PA Location: [facility] Note by: Gaspar Cola, MD Date: 10/03/2017; Time: 12:18 PM  Disclaimer:  Medicine is not an Chief Strategy Officer. The only guarantee in medicine is that nothing is guaranteed. It is important to note that the decision to proceed with this intervention was based on the information collected from the patient. The Data and conclusions were drawn from the patient's questionnaire, the interview, and the physical examination. Because the information was provided in large part by the patient, it cannot be guaranteed that it has not been purposely or unconsciously manipulated. Every effort has been made to obtain as much relevant data as possible for this evaluation. It is important to note that the conclusions that lead to this procedure are derived in large part from the available data. Always take into account that the treatment will also be dependent on availability of resources and existing treatment guidelines, considered by other Pain Management Practitioners as being common knowledge  and practice, at the time of the intervention. For Medico-Legal purposes, it is also important to point out that variation in procedural techniques and pharmacological choices are the acceptable norm. The indications, contraindications, technique, and results of the above procedure should only be interpreted and judged by a Board-Certified Interventional Pain Specialist with extensive familiarity and expertise in the same exact procedure and technique.

## 2017-10-03 NOTE — Patient Instructions (Addendum)
Pain Management Discharge Instructions  General Discharge Instructions :  If you need to reach your doctor call: Monday-Friday 8:00 am - 4:00 pm at [phone] or toll free [phone].  After clinic hours [phone] to have operator reach doctor.  Bring all of your medication bottles to all your appointments in the pain clinic.  To cancel or reschedule your appointment with Pain Management please remember to call 24 hours in advance to avoid a fee.  Refer to the educational materials which you have been given on: General Risks, I had my Procedure. Discharge Instructions, Post Sedation.  Post Procedure Instructions:  The drugs you were given will stay in your system until tomorrow, so for the next 24 hours you should not drive, make any legal decisions or drink any alcoholic beverages.  You may eat anything you prefer, but it is better to start with liquids then soups and crackers, and gradually work up to solid foods.  Please notify your doctor immediately if you have any unusual bleeding, trouble breathing or pain that is not related to your normal pain.  Depending on the type of procedure that was done, some parts of your body may feel week and/or numb.  This usually clears up by tonight or the next day.  Walk with the use of an assistive device or accompanied by an adult for the 24 hours.  You may use ice on the affected area for the first 24 hours.  Put ice in a Ziploc bag and cover with a towel and place against area 15 minutes on 15 minutes off.  You may switch to heat after 24 hours.Radiofrequency Lesioning Radiofrequency lesioning is a procedure that is performed to relieve pain. The procedure is often used for back, neck, or arm pain. Radiofrequency lesioning involves the use of a machine that creates radio waves to make heat. During the procedure, the heat is applied to the nerve that carries the pain signal. The heat damages the nerve and interferes with the pain signal.  Pain relief usually starts about 2 weeks after the procedure and lasts for 6 months to 1 year. Tell a health care provider about:  Any allergies you have.  All medicines you are taking, including vitamins, herbs, eye drops, creams, and over-the-counter medicines.  Any problems you or family members have had with anesthetic medicines.  Any blood disorders you have.  Any surgeries you have had.  Any medical conditions you have.  Whether you are pregnant or may be pregnant. What are the risks? Generally, this is a safe procedure. However, problems may occur, including:  Pain or soreness at the injection site.  Infection at the injection site.  Damage to nerves or blood vessels.  What happens before the procedure?  Ask your health care provider about: ? Changing or stopping your regular medicines. This is especially important if you are taking diabetes medicines or blood thinners. ? Taking medicines such as aspirin and ibuprofen. These medicines can thin your blood. Do not take these medicines before your procedure if your health care provider instructs you not to.  Follow instructions from your health care provider about eating or drinking restrictions.  Plan to have someone take you home after the procedure.  If you go home right after the procedure, plan to have someone with you for 24 hours. What happens during the procedure?  You will be given one or more of the following: ? A medicine to help you relax (sedative). ? A medicine to numb the area (  local anesthetic).  You will be awake during the procedure. You will need to be able to talk with the health care provider during the procedure.  With the help of a type of X-ray (fluoroscopy), the health care provider will insert a radiofrequency needle into the area to be treated.  Next, a wire that carries the radio waves (electrode) will be put through the radiofrequency needle. An electrical pulse will be sent through the  electrode to verify the correct nerve. You will feel a tingling sensation, and you may have muscle twitching.  Then, the tissue that is around the needle tip will be heated by an electric current that is passed using the radiofrequency machine. This will numb the nerves.  A bandage (dressing) will be put on the insertion area after the procedure is done. The procedure may vary among health care providers and hospitals. What happens after the procedure?  Your blood pressure, heart rate, breathing rate, and blood oxygen level will be monitored often until the medicines you were given have worn off.  Return to your normal activities as directed by your health care provider. This information is not intended to replace advice given to you by your health care provider. Make sure you discuss any questions you have with your health care provider. Document Released: 06/01/2011 Document Revised: 03/10/2016 Document Reviewed: 11/10/2014 Elsevier Interactive Patient Education  2018 [employer]. ____________________________________________________________________________________________  Initial Gabapentin Titration  Medication used: Gabapentin (Generic Name) or Neurontin (Brand Name) 100 mg tablets/capsules  Reasons to stop increasing the dose:  Reason 1: You get good relief of symptoms, in which case there is no need to increase the daily dose any further.    Reason 2: You develop some side effects, such as sleeping all of the time, difficulty concentrating, or becoming disoriented, in which case you need to go down on the dose, to the prior level, where you were not experiencing any side effects. Stay on that dose longer, to allow more time for your body to get use it, before attempting to increase it again.   Steps: Step 1: Start by taking 1 (one) tablet at bedtime x 7 (seven) days.  Step 2: After being on 1 (one) tablet for 7 (seven) days, then increase it to 2 (two) tablets at bedtime for  another 7 (seven) days.  Step 3: Next, after being on 2 (two) tablets at bedtime for 7 (seven) days, then increase it to 3 (three) tablets at bedtime, and stay on that dose until you see your doctor.  Reasons to stop increasing the dose: Reason 1: You get good relief of symptoms, in which case there is no need to increase the daily dose any further.  Reason 2: You develop some side effects, such as sleeping all of the time, difficulty concentrating, or becoming disoriented, in which case you need to go down on the dose, to the prior level, where you were not experiencing any side effects. Stay on that dose longer, to allow more time for your body to get use it, before attempting to increase it again.  Endpoint: Once you have reached the maximum dose you can tolerate without side-effects, contact your physician so as to evaluate the results of the regimen.   Questions: Feel free to contact us for any questions or problems at [phone] ____________________________________________________________________________________________

## 2017-10-04 ENCOUNTER — Telehealth: Payer: Self-pay

## 2017-10-04 NOTE — Telephone Encounter (Signed)
No answer. Instructed to call if needed.

## 2017-10-06 ENCOUNTER — Other Ambulatory Visit: Payer: Self-pay | Admitting: Family Medicine

## 2017-10-17 ENCOUNTER — Emergency Department
Admission: EM | Admit: 2017-10-17 | Discharge: 2017-10-17 | Disposition: A | Discharge status: Home / Self Care | Payer: Medicare PPO | Attending: Emergency Medicine | Admitting: Emergency Medicine

## 2017-10-17 ENCOUNTER — Encounter: Payer: Self-pay | Admitting: Emergency Medicine

## 2017-10-17 DIAGNOSIS — R1013 Epigastric pain: Secondary | ICD-10-CM | POA: Diagnosis not present

## 2017-10-17 DIAGNOSIS — F17210 Nicotine dependence, cigarettes, uncomplicated: Secondary | ICD-10-CM | POA: Diagnosis not present

## 2017-10-17 DIAGNOSIS — R1010 Upper abdominal pain, unspecified: Secondary | ICD-10-CM | POA: Diagnosis present

## 2017-10-17 DIAGNOSIS — R0602 Shortness of breath: Secondary | ICD-10-CM | POA: Diagnosis not present

## 2017-10-17 DIAGNOSIS — R110 Nausea: Secondary | ICD-10-CM | POA: Diagnosis not present

## 2017-10-17 DIAGNOSIS — I110 Hypertensive heart disease with heart failure: Secondary | ICD-10-CM | POA: Insufficient documentation

## 2017-10-17 DIAGNOSIS — R079 Chest pain, unspecified: Secondary | ICD-10-CM | POA: Diagnosis not present

## 2017-10-17 DIAGNOSIS — Z79899 Other long term (current) drug therapy: Secondary | ICD-10-CM | POA: Diagnosis not present

## 2017-10-17 DIAGNOSIS — I5032 Chronic diastolic (congestive) heart failure: Secondary | ICD-10-CM | POA: Insufficient documentation

## 2017-10-17 DIAGNOSIS — J449 Chronic obstructive pulmonary disease, unspecified: Secondary | ICD-10-CM | POA: Insufficient documentation

## 2017-10-17 LAB — URINALYSIS, COMPLETE (UACMP) WITH MICROSCOPIC
Bacteria, UA: NONE SEEN
Bilirubin Urine: NEGATIVE
Glucose, UA: NEGATIVE mg/dL
Ketones, ur: NEGATIVE mg/dL
Leukocytes, UA: NEGATIVE
Nitrite: NEGATIVE
Protein, ur: NEGATIVE mg/dL
Specific Gravity, Urine: 1.009 (ref 1.005–1.030)
pH: 6 (ref 5.0–8.0)

## 2017-10-17 LAB — LIPASE, BLOOD: Lipase: 31 U/L (ref 11–51)

## 2017-10-17 LAB — COMPREHENSIVE METABOLIC PANEL
ALT: 25 U/L (ref 14–54)
AST: 22 U/L (ref 15–41)
Albumin: 4 g/dL (ref 3.5–5.0)
Alkaline Phosphatase: 83 U/L (ref 38–126)
Anion gap: 6 (ref 5–15)
BUN: 22 mg/dL — ABNORMAL HIGH (ref 6–20)
CO2: 32 mmol/L (ref 22–32)
Calcium: 9.3 mg/dL (ref 8.9–10.3)
Chloride: 102 mmol/L (ref 101–111)
Creatinine, Ser: 0.7 mg/dL (ref 0.44–1.00)
GFR calc Af Amer: >60 mL/min (ref >60)
GFR calc non Af Amer: >60 mL/min (ref >60)
Glucose, Bld: 116 mg/dL — ABNORMAL HIGH (ref 65–99)
Potassium: 3.9 mmol/L (ref 3.5–5.1)
Sodium: 140 mmol/L (ref 135–145)
Total Bilirubin: 0.7 mg/dL (ref 0.3–1.2)
Total Protein: 7.3 g/dL (ref 6.5–8.1)

## 2017-10-17 LAB — CBC
HCT: 46.3 % (ref 35.0–47.0)
Hemoglobin: 15.6 g/dL (ref 12.0–16.0)
MCH: 33.2 pg (ref 26.0–34.0)
MCHC: 33.6 g/dL (ref 32.0–36.0)
MCV: 98.7 fL (ref 80.0–100.0)
Platelets: 167 10*3/uL (ref 150–440)
RBC: 4.69 MIL/uL (ref 3.80–5.20)
RDW: 15.5 % — ABNORMAL HIGH (ref 11.5–14.5)
WBC: 14.8 10*3/uL — ABNORMAL HIGH (ref 3.6–11.0)

## 2017-10-17 LAB — TROPONIN I: Troponin I: <0.03 ng/mL (ref <0.03)

## 2017-10-17 LAB — POCT PREGNANCY, URINE: Preg Test, Ur: NEGATIVE

## 2017-10-17 MED ORDER — FAMOTIDINE IN NACL 20-0.9 MG/50ML-% IV SOLN
20.0000 mg | Freq: Once | INTRAVENOUS | Status: AC
  Administered 2017-10-17: 20 mg via INTRAVENOUS
  Filled 2017-10-17: qty 50

## 2017-10-17 MED ORDER — GI COCKTAIL ~~LOC~~
30.0000 mL | Freq: Once | ORAL | Status: AC
Start: 2017-10-17 — End: 2017-10-17
  Administered 2017-10-17: 30 mL via ORAL
  Filled 2017-10-17: qty 30

## 2017-10-17 MED ORDER — OMEPRAZOLE 20 MG PO CPDR: 20.0000 mg | DELAYED_RELEASE_CAPSULE | Freq: Every day | ORAL | 0 refills | Status: DC

## 2017-10-17 NOTE — ED Triage Notes (Signed)
Patient presents to ED via POV from home with c/o abdominal pain x 3 days. Patient points to epigastric area. Patient denies V/D but does state she has been nauseous. Patient reports yesterday her belly button was sticking out and that it felt very tight.

## 2017-10-17 NOTE — Discharge Instructions (Signed)
Please begin taking your omeprazole once a day as prescribed and make an appointment to follow-up with the gastroenterologist for reevaluation.  Return to the emergency department sooner for any new or worsening symptoms such as worsening pain, fevers, chills, if you cannot eat or drink, or for any other issues whatsoever.  It was a pleasure to take care of you today, and thank you for coming to our emergency department.  If you have any questions or concerns before leaving please ask the nurse to grab me and I'm more than happy to go through your aftercare instructions again.  If you were prescribed any opioid pain medication today such as Norco, Vicodin, Percocet, morphine, hydrocodone, or oxycodone please make sure you do not drive when you are taking this medication as it can alter your ability to drive safely.  If you have any concerns once you are home that you are not improving or are in fact getting worse before you can make it to your follow-up appointment, please do not hesitate to call 911 and come back for further evaluation.  Darel Hong, MD  Results for orders placed or performed during the hospital encounter of 10/17/17  Lipase, blood  Result Value Ref Range   Lipase 31 11 - 51 U/L  Comprehensive metabolic panel  Result Value Ref Range   Sodium 140 135 - 145 mmol/L   Potassium 3.9 3.5 - 5.1 mmol/L   Chloride 102 101 - 111 mmol/L   CO2 32 22 - 32 mmol/L   Glucose, Bld 116 (H) 65 - 99 mg/dL   BUN 22 (H) 6 - 20 mg/dL   Creatinine, Ser 0.70 0.44 - 1.00 mg/dL   Calcium 9.3 8.9 - 10.3 mg/dL   Total Protein 7.3 6.5 - 8.1 g/dL   Albumin 4.0 3.5 - 5.0 g/dL   AST 22 15 - 41 U/L   ALT 25 14 - 54 U/L   Alkaline Phosphatase 83 38 - 126 U/L   Total Bilirubin 0.7 0.3 - 1.2 mg/dL   GFR calc non Af Amer >60 >60 mL/min   GFR calc Af Amer >60 >60 mL/min   Anion gap 6 5 - 15  CBC  Result Value Ref Range   WBC 14.8 (H) 3.6 - 11.0 K/uL   RBC 4.69 3.80 - 5.20 MIL/uL   Hemoglobin 15.6  12.0 - 16.0 g/dL   HCT 46.3 35.0 - 47.0 %   MCV 98.7 80.0 - 100.0 fL   MCH 33.2 26.0 - 34.0 pg   MCHC 33.6 32.0 - 36.0 g/dL   RDW 15.5 (H) 11.5 - 14.5 %   Platelets 167 150 - 440 K/uL  Urinalysis, Complete w Microscopic  Result Value Ref Range   Color, Urine STRAW (A) YELLOW   APPearance CLEAR (A) CLEAR   Specific Gravity, Urine 1.009 1.005 - 1.030   pH 6.0 5.0 - 8.0   Glucose, UA NEGATIVE NEGATIVE mg/dL   Hgb urine dipstick SMALL (A) NEGATIVE   Bilirubin Urine NEGATIVE NEGATIVE   Ketones, ur NEGATIVE NEGATIVE mg/dL   Protein, ur NEGATIVE NEGATIVE mg/dL   Nitrite NEGATIVE NEGATIVE   Leukocytes, UA NEGATIVE NEGATIVE   RBC / HPF 0-5 0 - 5 RBC/hpf   WBC, UA 0-5 0 - 5 WBC/hpf   Bacteria, UA NONE SEEN NONE SEEN   Squamous Epithelial / LPF 0-5 (A) NONE SEEN  Troponin I  Result Value Ref Range   Troponin I <0.03 <0.03 ng/mL  Pregnancy, urine POC  Result Value Ref  Range   Preg Test, Ur NEGATIVE NEGATIVE

## 2017-10-17 NOTE — ED Provider Notes (Signed)
[facility] Emergency Department Provider Note  ____________________________________________   First MD Initiated Contact with Patient 10/17/17 1754     (approximate)  I have reviewed the triage vital signs and the nursing notes.   HISTORY  Chief Complaint Abdominal Pain    HPI Theresa [name] is a 62 y.o. female who presents to the emergency department with several days of upper abdominal discomfort rising her chest to her mouth and her jaw.  Her symptoms were intermittent for the past 2-3 days however this morning when she awoke there were constant.  It is associated with a foul bitter taste in her mouth as well as morning cough.  Her symptoms had insidious onset and are now constant.  They are moderate severity.  They are nonexertional.  She has no shortness of breath.  She does have a history of gastric reflux for which she takes ranitidine twice a day.  She has no leg swelling.  Her pain is not ripping or tearing does not go straight to her back.  Past Medical History:  Diagnosis Date  . Anxiety   . Carpal tunnel syndrome   . CHF (congestive heart failure) ([place])   . CHF (congestive heart failure) ([place])    1/18  . Chronic generalized abdominal pain   . Chronic rhinitis   . COPD (chronic obstructive pulmonary disease) ([place])   . DDD (degenerative disc disease), cervical   . Depression   . Edema   . Flu    1/18  . Gastritis   . Hematuria   . Hypertension   . Kidney stones   . Low back pain   . Lumbar radiculitis   . Malodorous urine   . Muscle weakness   . Obesity   . Renal cyst   . Sensory urge incontinence   . Tobacco abuse     Patient Active Problem List   Diagnosis Date Noted  . Acute postoperative pain 10/03/2017  . COPD (chronic obstructive pulmonary disease) ([place]) 08/11/2017  . Other long term (current) drug therapy 08/08/2017  . Disorder of bone, unspecified 08/08/2017  . COPD with hypoxia ([place]) 06/27/2017  . Arthralgia of  acromioclavicular joint (Right) 06/15/2017  . Acromioclavicular joint DJD (Right) 06/15/2017  . 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  . Chronic shoulder pain (Left) 05/11/2017  . Elevated brain natriuretic peptide (BNP) level 05/11/2017  . Grief at loss of child 02/14/2017  . Lumbar facet hypertrophy (multilevel) 11/17/2016  . Lumbar spinal stenosis (L4-5) 11/17/2016  . 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) 11/17/2016  . 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 Lumbosacral radiculopathy at L4 11/17/2016  . Supplemental oxygen dependent 08/01/2016  . Sleep apnea 06/02/2016  . Long term current use of opiate analgesic 01/12/2016  . Long term prescription opiate use 01/12/2016  . Opiate use (15 MME/Day) 01/12/2016  . Lumbar facet syndrome (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) 01/12/2016  . Chronic sacroiliac joint pain (Bilateral) 01/12/2016  . Vitamin D deficiency 12/30/2015  . Chronic hip pain (Location of Tertiary Source of Pain) (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) 12/28/2015  . 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 Obesity, Class III, BMI 40-49.9 (morbid obesity) ([place]) 12/28/2015  . 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[place]) 11/19/2015  . Coagulation disorder ([place]) 10/22/2015  . Chronic pain syndrome (significant psychosocial component) 09/21/2015  . 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 09/21/2015  . Encounter for therapeutic drug level monitoring 09/16/2015  . Encounter for chronic pain management 09/16/2015  . Pain management  09/16/2015  . Neurogenic pain 09/16/2015  . Neuropathic pain 09/16/2015  . Musculoskeletal pain 09/16/2015  . Lumbar spondylosis (L4-5) 09/16/2015  . Chronic low back pain (Location of Primary Source of Pain) (Left) 09/16/2015  . Chronic lower extremity pain (Location of Secondary source of pain) (Left) 09/16/2015  . Chronic lumbar radicular pain (L4 & S1 Dermatome) (Left) 09/16/2015  . Chronic  shoulder pain (Right side) 09/16/2015  . Chronic upper extremity pain (Right-sided) 09/16/2015  . Cervical radiculitis (Right side) 09/16/2015  . Abnormal x-ray of lumbar spine 09/16/2015  . Chronic diastolic heart failure ([place]) 07/15/2015  . Chest pain 07/15/2015  . Tobacco abuse 07/15/2015  . Blood clotting disorder ([place]) 04/21/2015  . Decreased motor strength 07/16/2014  . Clinical depression 07/16/2014  . Chronic rhinitis 07/16/2014  . Carpal tunnel syndrome 07/16/2014  . Anxiety state 07/16/2014  . Neuritis or radiculitis due to rupture of lumbar intervertebral disc 07/02/2014  . DDD (degenerative disc disease), lumbar 07/02/2014  . CAFL (chronic airflow limitation) ([place]) 02/27/2014  . Essential (primary) hypertension 03/27/2008    Past Surgical History:  Procedure Laterality Date  . ABDOMINAL HYSTERECTOMY    . CARPAL TUNNEL RELEASE Left   . TONSILLECTOMY    . TUBAL LIGATION      Prior to Admission medications   Medication Sig Start Date End Date Taking? Authorizing Provider  albuterol (PROVENTIL HFA;VENTOLIN HFA) 108 (90 Base) MCG/ACT inhaler Inhale 2 puffs into the lungs every 6 (six) hours as needed for wheezing. Reported on 11/03/2015 11/02/16   Demetrios Loll, MD  albuterol (VENTOLIN HFA) 108 [phone] Base) MCG/ACT inhaler INHALE 2 PUFFS BY MOUTH FOUR TIMES DAILY AS NEEDED 06/06/17   [provider]  ALPRAZolam Duanne Moron) 0.5 MG tablet Take 1 tablet (0.5 mg total) by mouth 2 (two) times daily as needed for anxiety. Patient taking differently: Take 0.25 mg by mouth 2 (two) times daily as needed for anxiety.  12/09/16   Darylene Price A, FNP  Aspirin-Caffeine (BC FAST PAIN RELIEF ARTHRITIS) 1000-65 MG PACK Take 1 packet by mouth every 6 (six) hours as needed (Pain).    [provider]  bisoprolol-hydrochlorothiazide [facility]) 10-6.25 MG tablet TAKE 1 TABLET BY MOUTH EVERY DAY 10/06/17   Chrismon, Vickki Muff, PA  buPROPion (WELLBUTRIN XL) 150 MG 24 hr tablet Take 150 mg by mouth  daily.    [provider]  Cholecalciferol ([employer] D3) 2000 units CAPS Take 1 capsule by mouth daily.    [provider]  Fluticasone-Salmeterol (ADVAIR DISKUS) 250-50 MCG/DOSE AEPB INHALE 1 PUFF INTO LUNGS TWICE A DAY 05/23/16   [provider]  furosemide (LASIX) 40 MG tablet Take 1 tablet (40 mg total) by mouth 2 (two) times daily. 10/03/17   [name], FNP  gabapentin (NEURONTIN) 100 MG capsule Take 1-3 capsules (100-300 mg total) by mouth at bedtime. Follow written titration schedule. 10/03/17 12/02/17  Milinda Pointer, MD  HYDROcodone-acetaminophen (NORCO/VICODIN) 5-325 MG tablet Take 1 tablet by mouth every 8 (eight) hours as needed for severe pain. 08/22/17 09/21/17  Vevelyn Francois, NP  HYDROcodone-acetaminophen (NORCO/VICODIN) 5-325 MG tablet Take 1 tablet by mouth every 8 (eight) hours as needed for severe pain. 09/21/17 10/21/17  Vevelyn Francois, NP  HYDROcodone-acetaminophen (NORCO/VICODIN) 5-325 MG tablet Take 1 tablet by mouth every 8 (eight) hours as needed for severe pain. 10/21/17 11/20/17  Vevelyn Francois, NP  ipratropium-albuterol (DUONEB) 0.5-2.5 (3) MG/3ML SOLN Take 3 mLs by nebulization every 6 (six) hours as needed (shortness of breath).  11/02/16   Demetrios Loll, MD  Menthol, Topical Analgesic, [facility] COLD THERAPY) 5 % GEL Apply 1 application topically daily.    [provider]  omeprazole (PRILOSEC) 20 MG capsule Take 1 capsule (20 mg total) by mouth daily. 10/17/17 10/17/18  Darel Hong, MD  OXYGEN Inhale 2.5 L into the lungs continuous. Reported on 11/03/2015    [provider]  potassium chloride SA (K-DUR,KLOR-CON) 20 MEQ tablet Take 1 tablet (20 mEq total) by mouth 2 (two) times daily. 10/03/17   [name], FNP  ranitidine (ZANTAC) 150 MG tablet Take 150 mg by mouth 2 (two) times daily.    [provider]  roflumilast (DALIRESP) 500 MCG TABS tablet Take 500 mcg by mouth daily.    [provider]  sertraline  (ZOLOFT) 100 MG tablet TAKE 1.5 TABLET (150 MG TOTAL) BY MOUTH DAILY. 02/23/17   Chrismon, Vickki Muff, PA  traZODone (DESYREL) 50 MG tablet TK 1 T PO HS PRN 06/20/17   [provider]    Allergies Codeine and Meloxicam  Family History  Problem Relation Age of Onset  . Heart disease Mother   . Stroke Mother   . Coronary artery disease Mother   . Lung cancer Sister   . Alcohol abuse Father   . Heart disease Father   . Prostate cancer Neg Hx   . Kidney cancer Neg Hx   . Bladder Cancer Neg Hx     Social History Social History   Tobacco Use  . Smoking status: Current Every Day Smoker    Packs/day: 1.00    Years: 40.00    Pack years: 40.00    Types: Cigarettes  . Smokeless tobacco: Never Used  . Tobacco comment: first day of patch patient states she's trying  Substance Use Topics  . Alcohol use: No    Alcohol/week: 0.0 oz  . Drug use: No    Review of Systems Constitutional: No fever/chills Eyes: No visual changes. ENT: No sore throat. Cardiovascular: Positive for chest pain. Respiratory: Denies shortness of breath. Gastrointestinal: Positive for abdominal pain.  Positive for nausea, no vomiting.  No diarrhea.  No constipation. Genitourinary: Negative for dysuria. Musculoskeletal: Negative for back pain. Skin: Negative for rash. Neurological: Negative for headaches, focal weakness or numbness.   ____________________________________________   PHYSICAL EXAM:  VITAL SIGNS: ED Triage Vitals  Enc Vitals Group     BP 10/17/17 1529 (!) 101/57     Pulse Rate 10/17/17 1529 75     Resp 10/17/17 1529 16     Temp 10/17/17 1529 98.5 F (36.9 C)     Temp Source 10/17/17 1529 Oral     SpO2 10/17/17 1529 95 %     Weight 10/17/17 1531 236 lb (107 kg)     Height 10/17/17 1531 _0  (1.575 m)     Head Circumference --      Peak Flow --      Pain Score 10/17/17 1538 5     Pain Loc --      Pain Edu? --      Excl. in [place]? --     Constitutional: Alert and oriented x4  well-appearing nontoxic no diaphoresis speaks full clear sentences Eyes: PERRL EOMI. Head: Atraumatic. Nose: No congestion/rhinnorhea. Mouth/Throat: No trismus Neck: No stridor.   Cardiovascular: Normal rate, regular rhythm. Grossly normal heart sounds.  Good peripheral circulation. Respiratory: Normal respiratory effort.  No retractions. Lungs CTAB and moving good air Gastrointestinal: Obese abdomen soft nondistended nontender no  rebound or guarding no peritonitis no McBurney's tenderness negative Rovsing's no costovertebral tenderness Musculoskeletal: No lower extremity edema   Neurologic:  Normal speech and language. No gross focal neurologic deficits are appreciated. Skin:  Skin is warm, dry and intact. No rash noted. Psychiatric: Mood and affect are normal. Speech and behavior are normal.    ____________________________________________   DIFFERENTIAL includes but not limited to  Gastric reflux, esophagitis, esophageal spasm, acute coronary syndrome, pulmonary embolism, aortic dissection, COPD exacerbation ____________________________________________   LABS (all labs ordered are listed, but only abnormal results are displayed)  Labs Reviewed  COMPREHENSIVE METABOLIC PANEL - Abnormal; Notable for the following components:      Result Value   Glucose, Bld 116 (*)    BUN 22 (*)    All other components within normal limits  CBC - Abnormal; Notable for the following components:   WBC 14.8 (*)    RDW 15.5 (*)    All other components within normal limits  URINALYSIS, COMPLETE (UACMP) WITH MICROSCOPIC - Abnormal; Notable for the following components:   Color, Urine STRAW (*)    APPearance CLEAR (*)    Hgb urine dipstick SMALL (*)    Squamous Epithelial / LPF 0-5 (*)    All other components within normal limits  LIPASE, BLOOD  TROPONIN I  POC URINE PREG, ED  POCT PREGNANCY, URINE    Lab work reviewed by me with no signs of acute ischemia.  Elevated white count is  nonspecific and could be secondary to pain stress __________________________________________  EKG  ED ECG REPORT I, Darel Hong, the attending physician, personally viewed and interpreted this ECG.  Date: 10/17/2017 EKG Time:  Rate: 64 Rhythm: normal sinus rhythm QRS Axis: normal Intervals: normal ST/T Wave abnormalities: normal Narrative Interpretation: no evidence of acute ischemia  ____________________________________________  RADIOLOGY   ____________________________________________   PROCEDURES  Procedure(s) performed: no  Procedures  Critical Care performed: no  Observation: no ____________________________________________   INITIAL IMPRESSION / ASSESSMENT AND PLAN / ED COURSE  Pertinent labs & imaging results that were available during my care of the patient were reviewed by me and considered in my medical decision making (see chart for details).  Appreciate the initial triage note of abdominal pain, however to me the patient denies abdominal pain saying she is having pain arising from her upper abdomen into her chest and throat radiating to her jaw.  This is concerning either for acute coronary syndrome versus gastric etiology.  Have added on a troponin will give a trial of symptomatic treatment and reevaluate.     ----------------------------------------- 7:17 PM on 10/17/2017 -----------------------------------------  The patient's pain is now completely resolved.  Her troponin is negative and EKG is nonischemic.  She is already taking ranitidine daily so I will increase her to a PPI and refer her to GI as an outpatient.  She is discharged home in improved condition with strict return precautions and she and her husband verbalized understanding and agreement the plan. ____________________________________________   FINAL CLINICAL IMPRESSION(S) / ED DIAGNOSES  Final diagnoses:  Epigastric pain      NEW MEDICATIONS STARTED DURING THIS  VISIT:  This SmartLink is deprecated. Use AVSMEDLIST instead to display the medication list for a patient.   Note:  This document was prepared using Dragon voice recognition software and may include unintentional dictation errors.     Darel Hong, MD 10/18/17 2221

## 2017-10-17 NOTE — ED Notes (Signed)
Pt c/o intermittent burning chest pain that radiates across the chest into the neck and jaw and left shoulder with nausea for the past 3 days, states the past 2 days the pain did not last but 10 or less minutes, states today it started and would not let up. States the pain is almost gone at present.the patient is on 2.5L [place] continuous at home.Marland Kitchen

## 2017-10-17 NOTE — ED Notes (Signed)
First nurse note: Pt arrived via EMS from home for reports of epigastric pain. EMS reports VSS, 12 lead unremarkable. No apparent distress noted on arrival.

## 2017-10-22 DIAGNOSIS — J9610 Chronic respiratory failure, unspecified whether with hypoxia or hypercapnia: Secondary | ICD-10-CM | POA: Diagnosis not present

## 2017-10-31 DIAGNOSIS — J9610 Chronic respiratory failure, unspecified whether with hypoxia or hypercapnia: Secondary | ICD-10-CM | POA: Diagnosis not present

## 2017-11-03 DIAGNOSIS — J9610 Chronic respiratory failure, unspecified whether with hypoxia or hypercapnia: Secondary | ICD-10-CM | POA: Diagnosis not present

## 2017-11-05 ENCOUNTER — Other Ambulatory Visit: Payer: Self-pay | Admitting: Pain Medicine

## 2017-11-07 ENCOUNTER — Ambulatory Visit: Payer: Medicare PPO | Admitting: Nurse Practitioner

## 2017-11-08 ENCOUNTER — Ambulatory Visit: Payer: Medicare HMO | Attending: Family | Admitting: Family

## 2017-11-08 ENCOUNTER — Other Ambulatory Visit: Payer: Self-pay

## 2017-11-08 ENCOUNTER — Encounter: Payer: Self-pay | Admitting: Nurse Practitioner

## 2017-11-08 ENCOUNTER — Encounter: Payer: Self-pay | Admitting: Family

## 2017-11-08 ENCOUNTER — Ambulatory Visit: Payer: Medicare HMO | Attending: Nurse Practitioner | Admitting: Nurse Practitioner

## 2017-11-08 VITALS — BP 100/70 | HR 74 | Resp 18 | Ht 62.0 in | Wt 241.1 lb

## 2017-11-08 VITALS — BP 96/61 | HR 66 | Temp 98.4°F | Ht 62.0 in | Wt 241.0 lb

## 2017-11-08 DIAGNOSIS — M25511 Pain in right shoulder: Secondary | ICD-10-CM | POA: Insufficient documentation

## 2017-11-08 DIAGNOSIS — M5030 Other cervical disc degeneration, unspecified cervical region: Secondary | ICD-10-CM | POA: Diagnosis not present

## 2017-11-08 DIAGNOSIS — Z6841 Body Mass Index (BMI) 40.0 and over, adult: Secondary | ICD-10-CM | POA: Insufficient documentation

## 2017-11-08 DIAGNOSIS — Z9981 Dependence on supplemental oxygen: Secondary | ICD-10-CM | POA: Diagnosis not present

## 2017-11-08 DIAGNOSIS — J449 Chronic obstructive pulmonary disease, unspecified: Secondary | ICD-10-CM | POA: Insufficient documentation

## 2017-11-08 DIAGNOSIS — G5603 Carpal tunnel syndrome, bilateral upper limbs: Secondary | ICD-10-CM | POA: Insufficient documentation

## 2017-11-08 DIAGNOSIS — M79601 Pain in right arm: Secondary | ICD-10-CM | POA: Diagnosis not present

## 2017-11-08 DIAGNOSIS — F17210 Nicotine dependence, cigarettes, uncomplicated: Secondary | ICD-10-CM | POA: Insufficient documentation

## 2017-11-08 DIAGNOSIS — F419 Anxiety disorder, unspecified: Secondary | ICD-10-CM | POA: Insufficient documentation

## 2017-11-08 DIAGNOSIS — Z79899 Other long term (current) drug therapy: Secondary | ICD-10-CM | POA: Insufficient documentation

## 2017-11-08 DIAGNOSIS — M899 Disorder of bone, unspecified: Secondary | ICD-10-CM | POA: Insufficient documentation

## 2017-11-08 DIAGNOSIS — F329 Major depressive disorder, single episode, unspecified: Secondary | ICD-10-CM | POA: Insufficient documentation

## 2017-11-08 DIAGNOSIS — M19011 Primary osteoarthritis, right shoulder: Secondary | ICD-10-CM | POA: Diagnosis not present

## 2017-11-08 DIAGNOSIS — G894 Chronic pain syndrome: Secondary | ICD-10-CM

## 2017-11-08 DIAGNOSIS — M5417 Radiculopathy, lumbosacral region: Secondary | ICD-10-CM | POA: Diagnosis not present

## 2017-11-08 DIAGNOSIS — G5600 Carpal tunnel syndrome, unspecified upper limb: Secondary | ICD-10-CM | POA: Diagnosis not present

## 2017-11-08 DIAGNOSIS — M545 Low back pain: Secondary | ICD-10-CM

## 2017-11-08 DIAGNOSIS — M5116 Intervertebral disc disorders with radiculopathy, lumbar region: Secondary | ICD-10-CM | POA: Diagnosis not present

## 2017-11-08 DIAGNOSIS — I110 Hypertensive heart disease with heart failure: Secondary | ICD-10-CM | POA: Insufficient documentation

## 2017-11-08 DIAGNOSIS — E669 Obesity, unspecified: Secondary | ICD-10-CM | POA: Insufficient documentation

## 2017-11-08 DIAGNOSIS — J410 Simple chronic bronchitis: Secondary | ICD-10-CM

## 2017-11-08 DIAGNOSIS — I5032 Chronic diastolic (congestive) heart failure: Secondary | ICD-10-CM | POA: Diagnosis not present

## 2017-11-08 DIAGNOSIS — Z79891 Long term (current) use of opiate analgesic: Secondary | ICD-10-CM

## 2017-11-08 DIAGNOSIS — G4730 Sleep apnea, unspecified: Secondary | ICD-10-CM | POA: Diagnosis not present

## 2017-11-08 DIAGNOSIS — M47896 Other spondylosis, lumbar region: Secondary | ICD-10-CM | POA: Diagnosis not present

## 2017-11-08 DIAGNOSIS — R079 Chest pain, unspecified: Secondary | ICD-10-CM | POA: Diagnosis not present

## 2017-11-08 DIAGNOSIS — M7918 Myalgia, other site: Secondary | ICD-10-CM | POA: Insufficient documentation

## 2017-11-08 DIAGNOSIS — M25551 Pain in right hip: Secondary | ICD-10-CM | POA: Diagnosis not present

## 2017-11-08 DIAGNOSIS — M48061 Spinal stenosis, lumbar region without neurogenic claudication: Secondary | ICD-10-CM | POA: Diagnosis not present

## 2017-11-08 DIAGNOSIS — E559 Vitamin D deficiency, unspecified: Secondary | ICD-10-CM | POA: Insufficient documentation

## 2017-11-08 DIAGNOSIS — M1610 Unilateral primary osteoarthritis, unspecified hip: Secondary | ICD-10-CM | POA: Diagnosis not present

## 2017-11-08 DIAGNOSIS — M25552 Pain in left hip: Secondary | ICD-10-CM

## 2017-11-08 DIAGNOSIS — G8929 Other chronic pain: Secondary | ICD-10-CM

## 2017-11-08 DIAGNOSIS — I288 Other diseases of pulmonary vessels: Secondary | ICD-10-CM | POA: Insufficient documentation

## 2017-11-08 DIAGNOSIS — M5412 Radiculopathy, cervical region: Secondary | ICD-10-CM | POA: Insufficient documentation

## 2017-11-08 DIAGNOSIS — I10 Essential (primary) hypertension: Secondary | ICD-10-CM

## 2017-11-08 DIAGNOSIS — Z5181 Encounter for therapeutic drug level monitoring: Secondary | ICD-10-CM | POA: Diagnosis not present

## 2017-11-08 DIAGNOSIS — M79605 Pain in left leg: Secondary | ICD-10-CM | POA: Diagnosis not present

## 2017-11-08 DIAGNOSIS — Z7982 Long term (current) use of aspirin: Secondary | ICD-10-CM | POA: Insufficient documentation

## 2017-11-08 DIAGNOSIS — Z720 Tobacco use: Secondary | ICD-10-CM

## 2017-11-08 DIAGNOSIS — Z888 Allergy status to other drugs, medicaments and biological substances status: Secondary | ICD-10-CM | POA: Insufficient documentation

## 2017-11-08 DIAGNOSIS — Z885 Allergy status to narcotic agent status: Secondary | ICD-10-CM | POA: Insufficient documentation

## 2017-11-08 MED ORDER — HYDROCODONE-ACETAMINOPHEN 5-325 MG PO TABS: 1.0000 | ORAL_TABLET | Freq: Three times a day (TID) | ORAL | 0 refills | Status: DC | PRN

## 2017-11-08 MED ORDER — GABAPENTIN 100 MG PO CAPS: 100.0000 mg | ORAL_CAPSULE | Freq: Every day | ORAL | 2 refills | Status: DC

## 2017-11-08 NOTE — Patient Instructions (Addendum)
____________________________________________________________________________________________  Medication Rules  Patient given prescription for hydrocodone x3 and gabapentin  Applies to: All patients receiving prescriptions (written or electronic).  Pharmacy of record: Pharmacy where electronic prescriptions will be sent. If written prescriptions are taken to a different pharmacy, please inform the nursing staff. The pharmacy listed in the electronic medical record should be the one where you would like electronic prescriptions to be sent.  Prescription refills: Only during scheduled appointments. Applies to both, written and electronic prescriptions.  NOTE: The following applies primarily to controlled substances (Opioid* Pain Medications).   Patient's responsibilities: 1. Pain Pills: Bring all pain pills to every appointment (except for procedure appointments). 2. Pill Bottles: Bring pills in original pharmacy bottle. Always bring newest bottle. Bring bottle, even if empty. 3. Medication refills: You are responsible for knowing and keeping track of what medications you need refilled. The day before your appointment, write a list of all prescriptions that need to be refilled. Bring that list to your appointment and give it to the admitting nurse. Prescriptions will be written only during appointments. If you forget a medication, it will not be "Called in", "Faxed", or "electronically sent". You will need to get another appointment to get these prescribed. 4. Prescription Accuracy: You are responsible for carefully inspecting your prescriptions before leaving our office. Have the discharge nurse carefully go over each prescription with you, before taking them home. Make sure that your name is accurately spelled, that your address is correct. Check the name and dose of your medication to make sure it is accurate. Check the number of pills, and the written instructions to make sure they are clear and  accurate. Make sure that you are given enough medication to last until your next medication refill appointment. 5. Taking Medication: Take medication as prescribed. Never take more pills than instructed. Never take medication more frequently than prescribed. Taking less pills or less frequently is permitted and encouraged, when it comes to controlled substances (written prescriptions).  6. Inform other Doctors: Always inform, all of your healthcare providers, of all the medications you take. 7. Pain Medication from other Providers: You are not allowed to accept any additional pain medication from any other Doctor or Healthcare provider. There are two exceptions to this rule. (see below) In the event that you require additional pain medication, you are responsible for notifying us, as stated below. 8. Medication Agreement: You are responsible for carefully reading and following our Medication Agreement. This must be signed before receiving any prescriptions from our practice. Safely store a copy of your signed Agreement. Violations to the Agreement will result in no further prescriptions. (Additional copies of our Medication Agreement are available upon request.) 9. Laws, Rules, & Regulations: All patients are expected to follow all Federal and [employer], [employer], Rules, [employer]. Ignorance of the Laws does not constitute a valid excuse. The use of any illegal substances is prohibited. 10. Adopted CDC guidelines & recommendations: Target dosing levels will be at or below 60 MME/day. Use of benzodiazepines** is not recommended.  Exceptions: There are only two exceptions to the rule of not receiving pain medications from other Healthcare Providers. 1. Exception #1 (Emergencies): In the event of an emergency (i.e.: accident requiring emergency care), you are allowed to receive additional pain medication. However, you are responsible for: As soon as you are able, call our office [phone], at any  time of the day or night, and leave a message stating your name, the date and nature of the  emergency, and the name and dose of the medication prescribed. In the event that your call is answered by a member of our staff, make sure to document and save the date, time, and the name of the person that took your information.  2. Exception #2 (Planned Surgery): In the event that you are scheduled by another doctor or dentist to have any type of surgery or procedure, you are allowed (for a period no longer than 30 days), to receive additional pain medication, for the acute post-op pain. However, in this case, you are responsible for picking up a copy of our "Post-op Pain Management for Surgeons" handout, and giving it to your surgeon or dentist. This document is available at our office, and does not require an appointment to obtain it. Simply go to our office during business hours (Monday-Thursday from 8:00 AM to 4:00 PM) (Friday 8:00 AM to 12:00 Noon) or if you have a scheduled appointment with [place], prior to your surgery, and ask for it by name. In addition, you will need to provide [place] with your name, name of your surgeon, type of surgery, and date of procedure or surgery.  *Opioid medications include: morphine, codeine, oxycodone, oxymorphone, hydrocodone, hydromorphone, meperidine, tramadol, tapentadol, buprenorphine, fentanyl, methadone. **Benzodiazepine medications include: diazepam (Valium), alprazolam (Xanax), clonazepam (Klonopine), lorazepam (Ativan), clorazepate (Tranxene), chlordiazepoxide (Librium), estazolam (Prosom), oxazepam (Serax), temazepam (Restoril), triazolam (Halcion)  ____________________________________________________________________________________________   BMI Assessment: Estimated body mass index is 44.08 kg/m as calculated from the following:   Height as of this encounter: _0  (1.575 m).   Weight as of this encounter: 241 lb (109.3 kg).  BMI interpretation table: BMI level  Category Range association with higher incidence of chronic pain  <18 kg/m2 Underweight   18.5-24.9 kg/m2 Ideal body weight   25-29.9 kg/m2 Overweight Increased incidence by 20%  30-34.9 kg/m2 Obese (Class I) Increased incidence by 68%  35-39.9 kg/m2 Severe obesity (Class II) Increased incidence by 136%  >40 kg/m2 Extreme obesity (Class III) Increased incidence by 254%   BMI Readings from Last 4 Encounters:  11/08/17 44.08 kg/m  11/08/17 44.10 kg/m  10/17/17 43.16 kg/m  10/03/17 42.16 kg/m   Wt Readings from Last 4 Encounters:  11/08/17 241 lb (109.3 kg)  11/08/17 241 lb 2 oz (109.4 kg)  10/17/17 236 lb (107 kg)  10/03/17 238 lb (108 kg)  ____________________________________________________________________________________________  Preparing for your procedure (without sedation) Instructions: . Oral Intake: Do not eat or drink anything for at least 3 hours prior to your procedure. . Transportation: Unless otherwise stated by your physician, you may drive yourself after the procedure. . Blood Pressure Medicine: Take your blood pressure medicine with a sip of water the morning of the procedure. . Blood thinners:  . Diabetics on insulin: Notify the staff so that you can be scheduled 1st case in the morning. If your diabetes requires high dose insulin, take only  of your normal insulin dose the morning of the procedure and notify the staff that you have done so. . Preventing infections: Shower with an antibacterial soap the morning of your procedure.  . Build-up your immune system: Take 1000 mg of Vitamin C with every meal (3 times a day) the day prior to your procedure. Marland Kitchen Antibiotics: Inform the staff if you have a condition or reason that requires you to take antibiotics before dental procedures. . Pregnancy: If you are pregnant, call and cancel the procedure. . Sickness: If you have a cold, fever, or any active infections, call and  cancel the procedure. . Arrival: You must be in  the facility at least 30 minutes prior to your scheduled procedure. . Children: Do not bring any children with you. . Dress appropriately: Bring dark clothing that you would not mind if they get stained. . Valuables: Do not bring any jewelry or valuables. Procedure appointments are reserved for interventional treatments only. Marland Kitchen No Prescription Refills. . No med ____________________________________________________________________________________________

## 2017-11-08 NOTE — Progress Notes (Signed)
Nursing Pain Medication Assessment:  Safety precautions to be maintained throughout the outpatient stay will include: orient to surroundings, keep bed in low position, maintain call bell within reach at all times, provide assistance with transfer out of bed and ambulation.  Medication Inspection Compliance: Pill count conducted under aseptic conditions, in front of the patient. Neither the pills nor the bottle was removed from the patient's sight at any time. Once count was completed pills were immediately returned to the patient in their original bottle.  Medication: Hydrocodone/APAP Pill/Patch Count: 38 of 90 pills remain Pill/Patch Appearance: Markings consistent with prescribed medication Bottle Appearance: Standard pharmacy container. Clearly labeled. Filled Date: 01 / 07 / 2019 Last Medication intake:  Today

## 2017-11-08 NOTE — Progress Notes (Signed)
Patient ID: Theresa [name], female    DOB: 06/17/1956, 62 y.o.   MRN: [id number]  HPI Theresa [name] is a 62 y/o female with a history of chronic low back pain, HTN, depression, COPD on long-term oxygen, anxiety, carpal tunnel syndrome, long-standing tobacco use and chronic heart failure.  Last echo was done 11/03/15 but unable to access results. Echo done on 02/19/15 showed an EF of 50% without valvular stenosis. Recent PFT's done on 01/18/16  Was in the ED 10/17/17 due to epigastric pain. PPI increased with follow-up with GI. Released that day.   She presents today for a follow-up visit with a chief complaint of moderate shortness of breath upon minimal exertion. She describes this as chronic in nature having been present for several years with varying levels of severity. She has associated cough, fatigue, chronic back pain, depression, difficulty sleeping and gradual weight gain. She denies any chest pain, edema, palpitations, abdominal distention or dizziness.   Past Medical History:  Diagnosis Date  . Anxiety   . Carpal tunnel syndrome   . CHF (congestive heart failure) ([place])   . CHF (congestive heart failure) ([place])    1/18  . Chronic generalized abdominal pain   . Chronic rhinitis   . COPD (chronic obstructive pulmonary disease) ([place])   . DDD (degenerative disc disease), cervical   . Depression   . Edema   . Flu    1/18  . Gastritis   . Hematuria   . Hypertension   . Kidney stones   . Low back pain   . Lumbar radiculitis   . Malodorous urine   . Muscle weakness   . Obesity   . Renal cyst   . Sensory urge incontinence   . Tobacco abuse    Past Surgical History:  Procedure Laterality Date  . ABDOMINAL HYSTERECTOMY    . CARPAL TUNNEL RELEASE Left   . TONSILLECTOMY    . TUBAL LIGATION     Family History  Problem Relation Age of Onset  . Heart disease Mother   . Stroke Mother   . Coronary artery disease Mother   . Lung cancer Sister   . Alcohol abuse Father   . Heart  disease Father   . Prostate cancer Neg Hx   . Kidney cancer Neg Hx   . Bladder Cancer Neg Hx    Social History   Tobacco Use  . Smoking status: Current Every Day Smoker    Packs/day: 1.00    Years: 40.00    Pack years: 40.00    Types: Cigarettes  . Smokeless tobacco: Never Used  . Tobacco comment: first day of patch patient states she's trying  Substance Use Topics  . Alcohol use: No    Alcohol/week: 0.0 oz   Allergies  Allergen Reactions  . Codeine Nausea And Vomiting  . Meloxicam Other (See Comments)    Stomach pain   Prior to Admission medications   Medication Sig Start Date End Date Taking? Authorizing Provider  albuterol (PROVENTIL HFA;VENTOLIN HFA) 108 (90 Base) MCG/ACT inhaler Inhale 2 puffs into the lungs every 6 (six) hours as needed for wheezing. Reported on 11/03/2015 Patient not taking: Reported on 11/08/2017 11/02/16  Yes Demetrios Loll, MD  albuterol (VENTOLIN HFA) 108 (90 Base) MCG/ACT inhaler INHALE 2 PUFFS BY MOUTH FOUR TIMES DAILY AS NEEDED 06/06/17  Yes [provider]  ALPRAZolam (XANAX) 0.5 MG tablet Take 1 tablet (0.5 mg total) by mouth 2 (two) times daily as needed for anxiety. Patient  taking differently: Take 0.25 mg by mouth 2 (two) times daily as needed for anxiety.  12/09/16  Yes Hackney, Tina A, FNP  Aspirin-Caffeine (BC FAST PAIN RELIEF ARTHRITIS) 1000-65 MG PACK Take 1 packet by mouth every 6 (six) hours as needed (Pain).   Yes [provider]  bisoprolol-hydrochlorothiazide (ZIAC) 10-6.25 MG tablet TAKE 1 TABLET BY MOUTH EVERY DAY 10/06/17  Yes Chrismon, Vickki Muff, PA  buPROPion (WELLBUTRIN XL) 150 MG 24 hr tablet Take 150 mg by mouth daily.   Yes [provider]  Cholecalciferol ([employer] D3) 2000 units CAPS Take 1 capsule by mouth daily.   Yes [provider]  Fluticasone-Salmeterol (ADVAIR DISKUS) 250-50 MCG/DOSE AEPB INHALE 1 PUFF INTO LUNGS TWICE A DAY 05/23/16  Yes [provider]  furosemide (LASIX) 40 MG tablet  Take 1 tablet (40 mg total) by mouth 2 (two) times daily. 10/03/17  Yes Darylene Price A, FNP  gabapentin (NEURONTIN) 100 MG capsule Take 1-3 capsules (100-300 mg total) by mouth at bedtime. Follow written titration schedule. 10/03/17 12/02/17 Yes Milinda Pointer, MD  HYDROcodone-acetaminophen (NORCO/VICODIN) 5-325 MG tablet Take 1 tablet by mouth every 8 (eight) hours as needed for severe pain. 10/21/17 11/20/17 Yes King, Diona Foley, NP  ipratropium-albuterol (DUONEB) 0.5-2.5 (3) MG/3ML SOLN Take 3 mLs by nebulization every 6 (six) hours as needed (shortness of breath). 11/02/16  Yes Demetrios Loll, MD  Menthol, Topical Analgesic, (BENGAY COLD THERAPY) 5 % GEL Apply 1 application topically daily.   Yes [provider]  OXYGEN Inhale 2.5 L into the lungs continuous. Reported on 11/03/2015   Yes [provider]  potassium chloride SA (K-DUR,KLOR-CON) 20 MEQ tablet Take 1 tablet (20 mEq total) by mouth 2 (two) times daily. 10/03/17  Yes Darylene Price A, FNP  ranitidine (ZANTAC) 150 MG tablet Take 150 mg by mouth 2 (two) times daily.   Yes [provider]  roflumilast (DALIRESP) 500 MCG TABS tablet Take 500 mcg by mouth daily.   Yes [provider]  sertraline (ZOLOFT) 100 MG tablet TAKE 1.5 TABLET (150 MG TOTAL) BY MOUTH DAILY. 02/23/17  Yes Chrismon, Vickki Muff, PA  traZODone (DESYREL) 50 MG tablet TK 1 T PO HS PRN 06/20/17  Yes [provider]  HYDROcodone-acetaminophen (NORCO/VICODIN) 5-325 MG tablet Take 1 tablet by mouth every 8 (eight) hours as needed for severe pain. 08/22/17 09/21/17  Vevelyn Francois, NP  HYDROcodone-acetaminophen (NORCO/VICODIN) 5-325 MG tablet Take 1 tablet by mouth every 8 (eight) hours as needed for severe pain. 09/21/17 10/21/17  Vevelyn Francois, NP  ketoconazole (NIZORAL) 2 % cream Apply 1 application topically daily. 11/05/17   [provider]    Review of Systems  Constitutional: Positive for fatigue. Negative for appetite change.   HENT: Negative for congestion, postnasal drip and sore throat.   Eyes: Negative.   Respiratory: Positive for cough and shortness of breath. Negative for chest tightness and wheezing.   Cardiovascular: Negative for chest pain, palpitations and leg swelling.  Gastrointestinal: Negative for abdominal distention, abdominal pain and nausea.  Endocrine: Negative.   Genitourinary: Negative.   Musculoskeletal: Positive for back pain. Negative for neck pain.  Skin: Negative.   Allergic/Immunologic: Negative.   Neurological: Negative for dizziness and light-headedness.  Hematological: Negative for adenopathy. Does not bruise/bleed easily.  Psychiatric/Behavioral: Positive for dysphoric mood and sleep disturbance (wearing oxygen & bipap at night). Negative for agitation and suicidal ideas. The patient is nervous/anxious.    Vitals:   11/08/17 1322 11/08/17 1358  BP: 93/61 100/70  Pulse: 74   Resp: 18   SpO2: 93%    Wt Readings from Last 3 Encounters:  11/08/17 241 lb (109.3 kg)  11/08/17 241 lb 2 oz (109.4 kg)  10/17/17 236 lb (107 kg)   Lab Results  Component Value Date   CREATININE 0.70 10/17/2017   CREATININE 0.72 08/08/2017   CREATININE 0.79 03/02/2017    Physical Exam  Constitutional: She is oriented to person, place, and time. She appears well-developed and well-nourished.  HENT:  Head: Normocephalic and atraumatic.  Neck: Normal range of motion. Neck supple. No JVD present.  Cardiovascular: Normal rate and regular rhythm.  Pulmonary/Chest: Effort normal. She has wheezes in the right lower field. She has no rales.  Abdominal: Soft. She exhibits no distension. There is no tenderness.  Musculoskeletal: She exhibits no edema or tenderness.  Neurological: She is alert and oriented to person, place, and time.  Skin: Skin is warm and dry.  Psychiatric: Her behavior is normal. Thought content normal. Her mood appears anxious. She does not exhibit a depressed mood.  Nursing note and  vitals reviewed.     Assessment & Plan:  1: Chronic heart failure with preserved ejection fraction- - NYHA class III - euvolemic today - continues to weigh daily at home. Reminded to call for an overnight weight gain of >2 pounds or a weekly weight gain of >5 pounds - by our scale, her weight is up 12 pounds from the last time she was here (October 2018) and she says that it's because of foods she ate over the holidays like homemade fudge - continues to take 52m furosemide BID along with 285m potassium BID - monitor sodium content carefully - saw cardiologist (CClayborn Bigness11/19/18  - does not fit criteria for REDS vest reading due to elevated BMI  2: HTN- - BP low initially; no dizziness - BMP from 10/17/17 reviewed and showed sodium 140, potassium 3.9 and GFR >60  3: COPD- - wearing bipap nightly - oxygen at 2.5L at bedtime and then as needed during the day - last saw pulmonology (FRaul Delon 10/04/17 -discussed [facility] and a brochure was given to her  4: Tobacco use- - alternates between the lozenges, gum and actual cigarettes - removes herself from the oxygen when smoking - complete cessation discussed for 3 minutes with her  Patient did not bring her medications nor a list. Each medication was verbally reviewed with the patient and she was encouraged to bring the bottles to every visit to confirm accuracy of list.  Return in 4 months or sooner for any questions/problems before then. Would like to be seen prior to the hot/humid summer weather.

## 2017-11-08 NOTE — Progress Notes (Signed)
Patient's Name: Theresa [name]  MRN: [id number]  Referring Provider: Margo Common, [place]  DOB: 1956/05/15  PCP: Margo Common, PA  DOS: 11/08/2017  Note by: Vevelyn Francois NP  Service setting: Ambulatory outpatient  Specialty: Interventional Pain Management  Location: [facility] (AMB) Pain Management Facility    Patient type: Established    Primary Reason(s) for Visit: Encounter for prescription drug management. (Level of risk: moderate)  CC: Back Pain (lower)  HPI  [name] is a 62 y.o. year old, female patient, who comes today for a medication management evaluation. She has Neuritis or radiculitis due to rupture of lumbar intervertebral disc; Decreased motor strength; Essential (primary) hypertension; Clinical depression; DDD (degenerative disc disease), lumbar; CAFL (chronic airflow limitation) ([place]); Chronic rhinitis; Carpal tunnel syndrome; Blood clotting disorder ([place]); Anxiety state; Chronic diastolic heart failure ([place]); Chest pain; Tobacco abuse; Encounter for therapeutic drug level monitoring; Encounter for chronic pain management; Pain management; Neurogenic pain; Neuropathic pain; Musculoskeletal pain; Lumbar spondylosis (L4-5); Chronic low back pain (Location of Primary Source of Pain) (Left); Chronic lower extremity pain (Location of Secondary source of pain) (Left); Chronic lumbar radicular pain (L4 & S1 Dermatome) (Left); Chronic shoulder pain (Right side); Chronic upper extremity pain (Right-sided); Cervical radiculitis (Right side); Abnormal x-ray of lumbar spine; Chronic pain syndrome (significant psychosocial component); 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; Coagulation disorder ([place]); Chronic hip pain (Location of Tertiary Source of Pain) (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); 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); Obesity, Class III, BMI 40-49.9 (morbid obesity) ([place]); 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[place]); Vitamin D deficiency; Long term current use of opiate  analgesic; Long term prescription opiate use; Opiate use (15 MME/Day); Lumbar facet syndrome (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); Chronic sacroiliac joint pain (Bilateral); Sleep apnea; Supplemental oxygen dependent; Lumbar facet hypertrophy (multilevel); Lumbar spinal stenosis (L4-5); 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); 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 Her primarily concern today is the Back Pain (lower)  Pain Assessment: Location: Lower Back Radiating: right leg Onset: More than a month ago Duration: Chronic pain Quality: Aching, Nagging, Constant Severity: 5 /10 (self-reported pain score)  Note: Reported level is compatible with observation.                          Timing: Constant Modifying factors: ice, heat   [name] was last scheduled for an appointment on 08/08/2017 for medication management. During today's appointment we reviewed [name]'s chronic pain status, as well as her outpatient medication regimen. She states that she has been increased pain since her procedure.   The patient  reports that she does not use drugs. Her body mass index is 44.08 kg/m.  Further details on both, my assessment(s), as well as the proposed treatment plan, please see below.  Controlled Substance Pharmacotherapy Assessment REMS (Risk Evaluation and Mitigation Strategy)  Analgesic:Hydrocodone/APAP 5/325 one tablet every 8 hours (15 mg/day of hydrocodone) MME/day:15 mg/day  Chauncey Fischer, RN  11/08/2017  2:59 PM  Sign at close  encounter Nursing Pain Medication Assessment:  Safety precautions to be maintained throughout the outpatient stay will include: orient to surroundings, keep bed in  low position, maintain call bell within reach at all times, provide assistance with transfer out of bed and ambulation.  Medication Inspection Compliance: Pill count conducted under aseptic conditions, in front of the patient. Neither the pills nor the bottle was removed from the patient's sight at any time. Once count was completed pills were immediately returned to the patient in their original bottle.  Medication: Hydrocodone/APAP Pill/Patch Count: 38 of 90 pills remain Pill/Patch Appearance: Markings consistent with prescribed medication Bottle Appearance: Standard pharmacy container. Clearly labeled. Filled Date: 01 / 07 / 2019 Last Medication intake:  Today   Pharmacokinetics: Liberation and absorption (onset of action): WNL Distribution (time to peak effect): WNL Metabolism and excretion (duration of action): WNL         Pharmacodynamics: Desired effects: Analgesia: [name] reports >50% benefit. Functional ability: Patient reports that medication allows her to accomplish basic ADLs Clinically meaningful improvement in function (CMIF): Sustained CMIF goals met Perceived effectiveness: Described as relatively effective, allowing for increase in activities of daily living (ADL) Undesirable effects: Side-effects or Adverse reactions: None reported Monitoring: [place] PMP: Online review of the past 57-monthperiod conducted. Compliant with practice rules and regulations Last UDS on record: Summary  Date Value Ref Range Status  04/11/2017 FINAL  Final    Comment:    ==================================================================== TOXASSURE SELECT 13 (MW) ==================================================================== Specimen Alert Note:  Urinary creatinine is low; ability to detect some drugs may be  compromised.  Interpret results with caution. ==================================================================== Test                             Result       Flag       Units Drug Present and Declared for Prescription Verification   Hydrocodone                    1156         EXPECTED   ng/mg creat   Norhydrocodone                 1550         EXPECTED   ng/mg creat    Sources of hydrocodone include scheduled prescription    medications. Norhydrocodone is an expected metabolite of    hydrocodone. Drug Absent but Declared for Prescription Verification   Alprazolam                     Not Detected UNEXPECTED ng/mg creat ==================================================================== Test                      Result    Flag   Units      Ref Range   Creatinine              16        L      mg/dL      >=20 ==================================================================== Declared Medications:  The flagging and interpretation on this report are based on the  following declared medications.  Unexpected results may arise from  inaccuracies in the declared medications.  **Note: The testing scope of this panel includes these medications:  Alprazolam  Hydrocodone (Hydrocodone-Acetaminophen)  **Note: The testing scope of this panel does not include following  reported medications:  Acetaminophen (Hydrocodone-Acetaminophen)  Albuterol  Albuterol (Ipratropium-Albuterol)  Bisoprolol  Caffeine (Caffeine/Aspirin)  Cholecalciferol  Fluticasone  Furosemide  Hydrochlorothiazide  Ipratropium (Ipratropium-Albuterol)  Menthol  Nicotine  Oxygen  Potassium  Ranitidine  Roflumilast  Salicylate (Caffeine/Aspirin)  Salmeterol  Sertraline ==================================================================== For clinical consultation, please call [phone]. ====================================================================    UDS interpretation: Compliant          Medication  Assessment Form: Reviewed. Patient indicates being compliant with therapy Treatment compliance: Compliant Risk Assessment Profile: Aberrant behavior: See prior evaluations. None observed or detected today Comorbid factors increasing risk of overdose: See prior notes. No additional risks detected today Risk of substance use disorder (SUD): Low Opioid Risk Tool - 11/08/17 1453      Family History of Substance Abuse   Alcohol  Positive Female    Illegal Drugs  Negative    Rx Drugs  Negative      Personal History of Substance Abuse   Alcohol  Negative    Illegal Drugs  Negative    Rx Drugs  Negative      Age   Age between 41-45 years   No      History of Preadolescent Sexual Abuse   History of Preadolescent Sexual Abuse  Negative or Female      Psychological Disease   Psychological Disease  Negative    Depression  Positive      Total Score   Opioid Risk Tool Scoring  2    Opioid Risk Interpretation  Low Risk      ORT Scoring interpretation table:  Score <3 = Low Risk for SUD  Score between 4-7 = Moderate Risk for SUD  Score >8 = High Risk for Opioid Abuse   Risk Mitigation Strategies:  Patient Counseling: Covered Patient-Prescriber Agreement (PPA): Present and active  Notification to other healthcare providers: Done  Pharmacologic Plan: No change in therapy, at this time.             Laboratory Chemistry  Inflammation Markers (CRP: Acute Phase) (ESR: Chronic Phase) Lab Results  Component Value Date   CRP 9.1 (H) 08/08/2017   ESRSEDRATE 16 08/08/2017   LATICACIDVEN 1.0 02/13/2017                 Rheumatology Markers No results found for: RF, ANA, LABURIC, URICUR, LYMEIGGIGMAB, LYMEABIGMQN              Renal Function Markers Lab Results  Component Value Date   BUN 22 (H) 10/17/2017   CREATININE 0.70 10/17/2017   GFRAA >60 10/17/2017   GFRNONAA >60 10/17/2017                 Hepatic Function Markers Lab Results  Component Value Date   AST 22 10/17/2017    ALT 25 10/17/2017   ALBUMIN 4.0 10/17/2017   ALKPHOS 83 10/17/2017   LIPASE 31 10/17/2017                 Electrolytes Lab Results  Component Value Date   NA 140 10/17/2017   K 3.9 10/17/2017   CL 102 10/17/2017   CALCIUM 9.3 10/17/2017   MG 2.1 08/08/2017   PHOS 3.9 10/25/2016                 Neuropathy Markers Lab Results  Component Value Date   VITAMINB12 501 08/08/2017   HIV Non Reactive 02/14/2017                 Bone Pathology Markers Lab Results  Component Value Date   25OHVITD1 13 (L) 09/17/2015   25OHVITD2 <1.0 09/17/2015   25OHVITD3 13 09/17/2015  Coagulation Parameters Lab Results  Component Value Date   PLT 167 10/17/2017                 Cardiovascular Markers Lab Results  Component Value Date   BNP 40.2 05/11/2017   CKTOTAL 72 12/27/2012   CKMB 1.0 12/27/2012   TROPONINI <0.03 10/17/2017   HGB 15.6 10/17/2017   HCT 46.3 10/17/2017                 CA Markers No results found for: CEA, CA125, LABCA2               Note: Lab results reviewed.  Recent Diagnostic Imaging Results  DG C-Arm 1-60 Min-No Report Fluoroscopy was utilized by the requesting physician.  No radiographic  interpretation.   Complexity Note: Imaging results reviewed. Results shared with Ms. Micalizzi, using Layman's terms.                         Meds   Current Outpatient Medications:  .  albuterol (VENTOLIN HFA) 108 (90 Base) MCG/ACT inhaler, INHALE 2 PUFFS BY MOUTH FOUR TIMES DAILY AS NEEDED, Disp: , Rfl:  .  ALPRAZolam (XANAX) 0.5 MG tablet, Take 1 tablet (0.5 mg total) by mouth 2 (two) times daily as needed for anxiety. (Patient taking differently: Take 0.25 mg by mouth 2 (two) times daily as needed for anxiety. ), Disp: 60 tablet, Rfl: 0 .  Aspirin-Caffeine (BC FAST PAIN RELIEF ARTHRITIS) 1000-65 MG PACK, Take 1 packet by mouth every 6 (six) hours as needed (Pain)., Disp: , Rfl:  .  bisoprolol-hydrochlorothiazide (ZIAC) 10-6.25 MG tablet, TAKE 1 TABLET  BY MOUTH EVERY DAY, Disp: 90 tablet, Rfl: 0 .  Cholecalciferol ([employer] D3) 2000 units CAPS, Take 1 capsule by mouth daily., Disp: , Rfl:  .  Fluticasone-Salmeterol (ADVAIR DISKUS) 250-50 MCG/DOSE AEPB, INHALE 1 PUFF INTO LUNGS TWICE A DAY, Disp: , Rfl:  .  furosemide (LASIX) 40 MG tablet, Take 1 tablet (40 mg total) by mouth 2 (two) times daily., Disp: 60 tablet, Rfl: 3 .  gabapentin (NEURONTIN) 100 MG capsule, Take 1-3 capsules (100-300 mg total) by mouth at bedtime. Follow written titration schedule., Disp: 90 capsule, Rfl: 2 .  [START ON 01/21/2018] HYDROcodone-acetaminophen (NORCO/VICODIN) 5-325 MG tablet, Take 1 tablet by mouth every 8 (eight) hours as needed for severe pain., Disp: 90 tablet, Rfl: 0 .  ipratropium-albuterol (DUONEB) 0.5-2.5 (3) MG/3ML SOLN, Take 3 mLs by nebulization every 6 (six) hours as needed (shortness of breath)., Disp: 360 mL, Rfl: 3 .  ketoconazole (NIZORAL) 2 % cream, Apply 1 application topically daily., Disp: , Rfl:  .  OXYGEN, Inhale 2.5 L into the lungs continuous. Reported on 11/03/2015, Disp: , Rfl:  .  potassium chloride SA (K-DUR,KLOR-CON) 20 MEQ tablet, Take 1 tablet (20 mEq total) by mouth 2 (two) times daily., Disp: 60 tablet, Rfl: 3 .  ranitidine (ZANTAC) 150 MG tablet, Take 150 mg by mouth 2 (two) times daily., Disp: , Rfl:  .  sertraline (ZOLOFT) 100 MG tablet, TAKE 1.5 TABLET (150 MG TOTAL) BY MOUTH DAILY., Disp: 135 tablet, Rfl: 4 .  [START ON 12/22/2017] HYDROcodone-acetaminophen (NORCO/VICODIN) 5-325 MG tablet, Take 1 tablet by mouth every 8 (eight) hours as needed for severe pain., Disp: 90 tablet, Rfl: 0 .  [START ON 11/22/2017] HYDROcodone-acetaminophen (NORCO/VICODIN) 5-325 MG tablet, Take 1 tablet by mouth every 8 (eight) hours as needed for severe pain., Disp: 90 tablet, Rfl: 0  ROS  Constitutional: Denies any fever or chills Gastrointestinal: No reported hemesis, hematochezia, vomiting, or acute GI distress Musculoskeletal: Denies any acute onset joint  swelling, redness, loss of ROM, or weakness Neurological: No reported episodes of acute onset apraxia, aphasia, dysarthria, agnosia, amnesia, paralysis, loss of coordination, or loss of consciousness  Allergies  Ms. Barnette is allergic to codeine and meloxicam.  PFSH  Drug: Ms. Hogan  reports that she does not use drugs. Alcohol:  reports that she does not drink alcohol. Tobacco:  reports that she has been smoking cigarettes.  She has a 40.00 pack-year smoking history. she has never used smokeless tobacco. Medical:  has a past medical history of Anxiety, Carpal tunnel syndrome, CHF (congestive heart failure) ([place]), CHF (congestive heart failure) ([place]), Chronic generalized abdominal pain, Chronic rhinitis, COPD (chronic obstructive pulmonary disease) ([place]), DDD (degenerative disc disease), cervical, Depression, Edema, Flu, Gastritis, Hematuria, Hypertension, Kidney stones, Low back pain, Lumbar radiculitis, Malodorous urine, Muscle weakness, Obesity, Renal cyst, Sensory urge incontinence, and Tobacco abuse. Surgical: Ms. Christy  has a past surgical history that includes Tubal ligation; Abdominal hysterectomy; Tonsillectomy; and Carpal tunnel release (Left). Family: family history includes Alcohol abuse in her father; Coronary artery disease in her mother; Heart disease in her father and mother; Lung cancer in her sister; Stroke in her mother.  Constitutional Exam  General appearance: alert, cooperative and moderately obese Vitals:   11/08/17 1436  BP: 96/61  Pulse: 66  Temp: 98.4 F (36.9 C)  SpO2: 95%  Weight: 241 lb (109.3 kg)  Height: _0  (1.575 m)  Psych/Mental status: Alert, oriented x 3 (person, place, & time)       Eyes: PERLA Respiratory: Oxygen-dependent COPD  Cervical Spine Area Exam  Skin & Axial Inspection: No masses, redness, edema, swelling, or associated skin lesions Alignment: Symmetrical Functional ROM: Unrestricted ROM      Stability: No instability  detected Muscle Tone/Strength: Functionally intact. No obvious neuro-muscular anomalies detected. Sensory (Neurological): Unimpaired Palpation: No palpable anomalies              Upper Extremity (UE) Exam    Side: Right upper extremity  Side: Left upper extremity  Skin & Extremity Inspection: Skin color, temperature, and hair growth are WNL. No peripheral edema or cyanosis. No masses, redness, swelling, asymmetry, or associated skin lesions. No contractures.  Skin & Extremity Inspection: Skin color, temperature, and hair growth are WNL. No peripheral edema or cyanosis. No masses, redness, swelling, asymmetry, or associated skin lesions. No contractures.  Functional ROM: Unrestricted ROM          Functional ROM: Unrestricted ROM          Muscle Tone/Strength: Functionally intact. No obvious neuro-muscular anomalies detected.  Muscle Tone/Strength: Functionally intact. No obvious neuro-muscular anomalies detected.  Sensory (Neurological): Unimpaired          Sensory (Neurological): Unimpaired          Palpation: No palpable anomalies              Palpation: No palpable anomalies              Specialized Test(s): Deferred         Specialized Test(s): Deferred          Thoracic Spine Area Exam  Skin & Axial Inspection: No masses, redness, or swelling Alignment: Symmetrical Functional ROM: Unrestricted ROM Stability: No instability detected Muscle Tone/Strength: Functionally intact. No obvious neuro-muscular anomalies detected. Sensory (Neurological): Unimpaired Muscle strength & Tone: No palpable anomalies  Lumbar  Spine Area Exam  Skin & Axial Inspection: No masses, redness, or swelling Alignment: Symmetrical Functional ROM: Unrestricted ROM      Stability: No instability detected Muscle Tone/Strength: Functionally intact. No obvious neuro-muscular anomalies detected. Sensory (Neurological): Unimpaired Palpation: No palpable anomalies       Provocative Tests: Lumbar Hyperextension and  rotation test: evaluation deferred today       Lumbar Lateral bending test: evaluation deferred today       Patrick's Maneuver: evaluation deferred today                    Gait & Posture Assessment  Ambulation: Patient came in today in a wheel chair Gait: Relatively normal for age and body habitus Posture: WNL   Lower Extremity Exam    Side: Right lower extremity  Side: Left lower extremity  Skin & Extremity Inspection: Skin color, temperature, and hair growth are WNL. No peripheral edema or cyanosis. No masses, redness, swelling, asymmetry, or associated skin lesions. No contractures.  Skin & Extremity Inspection: Skin color, temperature, and hair growth are WNL. No peripheral edema or cyanosis. No masses, redness, swelling, asymmetry, or associated skin lesions. No contractures.  Functional ROM: Unrestricted ROM          Functional ROM: Unrestricted ROM          Muscle Tone/Strength: Functionally intact. No obvious neuro-muscular anomalies detected.  Muscle Tone/Strength: Functionally intact. No obvious neuro-muscular anomalies detected.  Sensory (Neurological): Unimpaired  Sensory (Neurological): Unimpaired  Palpation: No palpable anomalies  Palpation: No palpable anomalies   Assessment  Primary Diagnosis & Pertinent Problem List: The primary encounter diagnosis was Chronic lower extremity pain (Location of Secondary source of pain) (Left). Diagnoses of Chronic low back pain (Location of Primary Source of Pain) (Left), Chronic pain of both hips, Long term current use of opiate analgesic, and Chronic pain syndrome were also pertinent to this visit.  Status Diagnosis  Controlled Controlled Controlled 1. Chronic lower extremity pain (Location of Secondary source of pain) (Left)   2. Chronic low back pain (Location of Primary Source of Pain) (Left)   3. Chronic pain of both hips   4. Long term current use of opiate analgesic   5. Chronic pain syndrome     Problems updated and reviewed  during this visit: No problems updated. Plan of Care  Pharmacotherapy (Medications Ordered): Meds ordered this encounter  Medications  . HYDROcodone-acetaminophen (NORCO/VICODIN) 5-325 MG tablet    Sig: Take 1 tablet by mouth every 8 (eight) hours as needed for severe pain.    Dispense:  90 tablet    Refill:  0    Do not add this medication to the electronic "Automatic Refill" notification system. Patient may have prescription filled one day early if pharmacy is closed on scheduled refill date. Do not fill until: 01/21/2018 To last until:02/20/2018    Order Specific Question:   Supervising Provider    Answer:   Milinda Pointer [phone]  . HYDROcodone-acetaminophen (NORCO/VICODIN) 5-325 MG tablet    Sig: Take 1 tablet by mouth every 8 (eight) hours as needed for severe pain.    Dispense:  90 tablet    Refill:  0    Do not add this medication to the electronic "Automatic Refill" notification system. Patient may have prescription filled one day early if pharmacy is closed on scheduled refill date. Do not fill until:12/22/2017 To last until: 01/21/2018    Order Specific Question:   Supervising Provider    Answer:  [place], [place] HYDROcodone-acetaminophen (NORCO/VICODIN) 5-325 MG tablet    Sig: Take 1 tablet by mouth every 8 (eight) hours as needed for severe pain.    Dispense:  90 tablet    Refill:  0    Do not add this medication to the electronic "Automatic Refill" notification system. Patient may have prescription filled one day early if pharmacy is closed on scheduled refill date. Do not fill until:11/22/2017 To last until: 12/22/2017    Order Specific Question:   Supervising Provider    Answer:   Milinda Pointer [phone]  . gabapentin (NEURONTIN) 100 MG capsule    Sig: Take 1-3 capsules (100-300 mg total) by mouth at bedtime. Follow written titration schedule.    Dispense:  90 capsule    Refill:  2    Do not place medication on "Automatic Refill". Fill one day early  if pharmacy is closed on scheduled refill date.    Order Specific Question:   Supervising Provider    Answer:   Milinda Pointer [[id number]]   New Prescriptions   No medications on file   Medications administered today: Jannat Rosemeyer had no medications administered during this visit. Lab-work, procedure(s), and/or referral(s): Orders Placed This Encounter  Procedures  . ToxASSURE Select 13 (MW), Urine   Imaging and/or referral(s): None  Interventional therapies: Planned, scheduled, and/or pending:  Not at this time   Cfor a onsidering:  Diagnostic bilateral lumbar facet block Possible bilateral lumbar facet RFA   Palliative PRN treatment(s):  Palliative left sided lumbar facetblock    Provider-requested follow-up: Return in about 3 months (around 02/06/2018) for MedMgmt with Me Donella Stade Edison Pace), 2 week Follow up RFA procedure, 4 week Follow up RFA pr.  Future Appointments  Date Time Provider [place]  11/27/2017  1:15 PM Milinda Pointer, MD [facility] None  02/01/2018  2:30 PM Vevelyn Francois, NP [facility] None  03/14/2018  1:20 PM [name], FNP [facility] None  04/10/2018  3:00 PM Harlin Heys, MD [facility] None   Primary Care Physician: Margo Common, PA Location: [facility] Note by: Vevelyn Francois NP Date: 11/08/2017; Time: 2:05 PM  Pain Score Disclaimer: We use the NRS-11 scale. This is a self-reported, subjective measurement of pain severity with only modest accuracy. It is used primarily to identify changes within a particular patient. It must be understood that outpatient pain scales are significantly less accurate that those used for research, where they can be applied under ideal controlled circumstances with minimal exposure to variables. In reality, the score is likely to be a combination of pain intensity and pain affect, where pain affect describes the degree of emotional arousal or changes in action  readiness caused by the sensory experience of pain. Factors such as social and work situation, setting, emotional state, anxiety levels, expectation, and prior pain experience may influence pain perception and show large inter-individual differences that may also be affected by time variables.  Patient instructions provided during this appointment: Patient Instructions    ____________________________________________________________________________________________  Medication Rules  Patient given prescription for hydrocodone x3 and gabapentin  Applies to: All patients receiving prescriptions (written or electronic).  Pharmacy of record: Pharmacy where electronic prescriptions will be sent. If written prescriptions are taken to a different pharmacy, please inform the nursing staff. The pharmacy listed in the electronic medical record should be the one where you would like electronic prescriptions to be sent.  Prescription refills: Only during scheduled appointments. Applies to both,  written and electronic prescriptions.  NOTE: The following applies primarily to controlled substances (Opioid* Pain Medications).   Patient's responsibilities: 1. Pain Pills: Bring all pain pills to every appointment (except for procedure appointments). 2. Pill Bottles: Bring pills in original pharmacy bottle. Always bring newest bottle. Bring bottle, even if empty. 3. Medication refills: You are responsible for knowing and keeping track of what medications you need refilled. The day before your appointment, write a list of all prescriptions that need to be refilled. Bring that list to your appointment and give it to the admitting nurse. Prescriptions will be written only during appointments. If you forget a medication, it will not be "Called in", "Faxed", or "electronically sent". You will need to get another appointment to get these prescribed. 4. Prescription Accuracy: You are responsible for carefully inspecting  your prescriptions before leaving our office. Have the discharge nurse carefully go over each prescription with you, before taking them home. Make sure that your name is accurately spelled, that your address is correct. Check the name and dose of your medication to make sure it is accurate. Check the number of pills, and the written instructions to make sure they are clear and accurate. Make sure that you are given enough medication to last until your next medication refill appointment. 5. Taking Medication: Take medication as prescribed. Never take more pills than instructed. Never take medication more frequently than prescribed. Taking less pills or less frequently is permitted and encouraged, when it comes to controlled substances (written prescriptions).  6. Inform other Doctors: Always inform, all of your healthcare providers, of all the medications you take. 7. Pain Medication from other Providers: You are not allowed to accept any additional pain medication from any other Doctor or Healthcare provider. There are two exceptions to this rule. (see below) In the event that you require additional pain medication, you are responsible for notifying us, as stated below. 8. Medication Agreement: You are responsible for carefully reading and following our Medication Agreement. This must be signed before receiving any prescriptions from our practice. Safely store a copy of your signed Agreement. Violations to the Agreement will result in no further prescriptions. (Additional copies of our Medication Agreement are available upon request.) 9. Laws, Rules, & Regulations: All patients are expected to follow all Federal and [employer], [employer], Rules, [employer]. Ignorance of the Laws does not constitute a valid excuse. The use of any illegal substances is prohibited. 10. Adopted CDC guidelines & recommendations: Target dosing levels will be at or below 60 MME/day. Use of benzodiazepines** is not  recommended.  Exceptions: There are only two exceptions to the rule of not receiving pain medications from other Healthcare Providers. 1. Exception #1 (Emergencies): In the event of an emergency (i.e.: accident requiring emergency care), you are allowed to receive additional pain medication. However, you are responsible for: As soon as you are able, call our office [phone], at any time of the day or night, and leave a message stating your name, the date and nature of the emergency, and the name and dose of the medication prescribed. In the event that your call is answered by a member of our staff, make sure to document and save the date, time, and the name of the person that took your information.  2. Exception #2 (Planned Surgery): In the event that you are scheduled by another doctor or dentist to have any type of surgery or procedure, you are allowed (for a period no longer than 30  days), to receive additional pain medication, for the acute post-op pain. However, in this case, you are responsible for picking up a copy of our "Post-op Pain Management for Surgeons" handout, and giving it to your surgeon or dentist. This document is available at our office, and does not require an appointment to obtain it. Simply go to our office during business hours (Monday-Thursday from 8:00 AM to 4:00 PM) (Friday 8:00 AM to 12:00 Noon) or if you have a scheduled appointment with [place], prior to your surgery, and ask for it by name. In addition, you will need to provide [place] with your name, name of your surgeon, type of surgery, and date of procedure or surgery.  *Opioid medications include: morphine, codeine, oxycodone, oxymorphone, hydrocodone, hydromorphone, meperidine, tramadol, tapentadol, buprenorphine, fentanyl, methadone. **Benzodiazepine medications include: diazepam (Valium), alprazolam (Xanax), clonazepam (Klonopine), lorazepam (Ativan), clorazepate (Tranxene), chlordiazepoxide (Librium), estazolam (Prosom),  oxazepam (Serax), temazepam (Restoril), triazolam (Halcion)  ____________________________________________________________________________________________   BMI Assessment: Estimated body mass index is 44.08 kg/m as calculated from the following:   Height as of this encounter: _0  (1.575 m).   Weight as of this encounter: 241 lb (109.3 kg).  BMI interpretation table: BMI level Category Range association with higher incidence of chronic pain  <18 kg/m2 Underweight   18.5-24.9 kg/m2 Ideal body weight   25-29.9 kg/m2 Overweight Increased incidence by 20%  30-34.9 kg/m2 Obese (Class I) Increased incidence by 68%  35-39.9 kg/m2 Severe obesity (Class II) Increased incidence by 136%  >40 kg/m2 Extreme obesity (Class III) Increased incidence by 254%   BMI Readings from Last 4 Encounters:  11/08/17 44.08 kg/m  11/08/17 44.10 kg/m  10/17/17 43.16 kg/m  10/03/17 42.16 kg/m   Wt Readings from Last 4 Encounters:  11/08/17 241 lb (109.3 kg)  11/08/17 241 lb 2 oz (109.4 kg)  10/17/17 236 lb (107 kg)  10/03/17 238 lb (108 kg)  ____________________________________________________________________________________________  Preparing for your procedure (without sedation) Instructions: . Oral Intake: Do not eat or drink anything for at least 3 hours prior to your procedure. . Transportation: Unless otherwise stated by your physician, you may drive yourself after the procedure. . Blood Pressure Medicine: Take your blood pressure medicine with a sip of water the morning of the procedure. . Blood thinners:  . Diabetics on insulin: Notify the staff so that you can be scheduled 1st case in the morning. If your diabetes requires high dose insulin, take only  of your normal insulin dose the morning of the procedure and notify the staff that you have done so. . Preventing infections: Shower with an antibacterial soap the morning of your procedure.  . Build-up your immune system: Take 1000 mg of  Vitamin C with every meal (3 times a day) the day prior to your procedure. Marland Kitchen Antibiotics: Inform the staff if you have a condition or reason that requires you to take antibiotics before dental procedures. . Pregnancy: If you are pregnant, call and cancel the procedure. . Sickness: If you have a cold, fever, or any active infections, call and cancel the procedure. . Arrival: You must be in the facility at least 30 minutes prior to your scheduled procedure. . Children: Do not bring any children with you. . Dress appropriately: Bring dark clothing that you would not mind if they get stained. . Valuables: Do not bring any jewelry or valuables. Procedure appointments are reserved for interventional treatments only. Marland Kitchen No Prescription Refills. . No med ____________________________________________________________________________________________

## 2017-11-08 NOTE — Patient Instructions (Signed)
Continue weighing daily and call for an overnight weight gain of > 2 pounds or a weekly weight gain of >5 pounds. 

## 2017-11-14 LAB — TOXASSURE SELECT 13 (MW), URINE

## 2017-11-15 ENCOUNTER — Ambulatory Visit
Admission: RE | Admit: 2017-11-15 | Discharge: 2017-11-15 | Disposition: A | Discharge status: Home / Self Care | Payer: Medicare HMO | Source: Ambulatory Visit | Attending: Family Medicine | Admitting: Family Medicine

## 2017-11-15 ENCOUNTER — Encounter: Payer: Self-pay | Admitting: Family Medicine

## 2017-11-15 ENCOUNTER — Telehealth: Payer: Self-pay

## 2017-11-15 ENCOUNTER — Ambulatory Visit (INDEPENDENT_AMBULATORY_CARE_PROVIDER_SITE_OTHER): Payer: Medicare HMO | Admitting: Family Medicine

## 2017-11-15 DIAGNOSIS — J441 Chronic obstructive pulmonary disease with (acute) exacerbation: Secondary | ICD-10-CM | POA: Insufficient documentation

## 2017-11-15 DIAGNOSIS — J984 Other disorders of lung: Secondary | ICD-10-CM | POA: Diagnosis not present

## 2017-11-15 DIAGNOSIS — R05 Cough: Secondary | ICD-10-CM | POA: Diagnosis not present

## 2017-11-15 MED ORDER — IPRATROPIUM-ALBUTEROL 0.5-2.5 (3) MG/3ML IN SOLN: 3.0000 mL | Freq: Four times a day (QID) | RESPIRATORY_TRACT | 3 refills | Status: DC | PRN

## 2017-11-15 MED ORDER — PREDNISONE 50 MG PO TABS
50.0000 mg | ORAL_TABLET | Freq: Every day | ORAL | 0 refills | Status: DC
Start: 2017-11-15 — End: 2017-11-23

## 2017-11-15 MED ORDER — DOXYCYCLINE HYCLATE 100 MG PO TABS: 100.0000 mg | ORAL_TABLET | Freq: Two times a day (BID) | ORAL | 0 refills | Status: AC

## 2017-11-15 MED ORDER — ALBUTEROL SULFATE HFA 108 (90 BASE) MCG/ACT IN AERS: 2.0000 | INHALATION_SPRAY | Freq: Four times a day (QID) | RESPIRATORY_TRACT | 5 refills | Status: DC | PRN

## 2017-11-15 NOTE — Patient Instructions (Signed)
Chronic Obstructive Pulmonary Disease Exacerbation  Chronic obstructive pulmonary disease (COPD) is a common lung problem. In COPD, the flow of air from the lungs is limited. COPD exacerbations are times that breathing gets worse and you need extra treatment. Without treatment they can be life threatening. If they happen often, your lungs can become more damaged. If your COPD gets worse, your doctor may treat you with:  ? Medicines.  ? Oxygen.  ? Different ways to clear your airway, such as using a mask.    Follow these instructions at home:  ? Do not smoke.  ? Avoid tobacco smoke and other things that bother your lungs.  ? If given, take your antibiotic medicine as told. Finish the medicine even if you start to feel better.  ? Only take medicines as told by your doctor.  ? Drink enough fluids to keep your pee (urine) clear or pale yellow (unless your doctor has told you not to).  ? Use a cool mist machine (vaporizer).  ? If you use oxygen or a machine that turns liquid medicine into a mist (nebulizer), continue to use them as told.  ? Keep up with shots (vaccinations) as told by your doctor.  ? Exercise regularly.  ? Eat healthy foods.  ? Keep all doctor visits as told.  Get help right away if:  ? You are very short of breath and it gets worse.  ? You have trouble talking.  ? You have bad chest pain.  ? You have blood in your spit (sputum).  ? You have a fever.  ? You keep throwing up (vomiting).  ? You feel weak, or you pass out (faint).  ? You feel confused.  ? You keep getting worse.  This information is not intended to replace advice given to you by your health care provider. Make sure you discuss any questions you have with your health care provider.  Document Released: 09/22/2011 Document Revised: 03/10/2016 Document Reviewed: 06/07/2013  Elsevier Interactive Patient Education ? 2017 [employer]

## 2017-11-15 NOTE — Progress Notes (Signed)
Patient: Theresa [name] Female    DOB: 1955-11-05   62 y.o.   MRN: [id number] Visit Date: 11/15/2017  Today's Provider: Lavon Paganini, MD   I, Martha Clan, CMA, am acting as scribe for Lavon Paganini, MD.  Chief Complaint  Patient presents with  . Cough   Subjective:    Cough  This is a new problem. Episode onset: x 1 week. The problem has been gradually worsening. The cough is productive of sputum (thick, white sputum). Associated symptoms include chest pain, chills, a fever (up to 101), headaches (sinus pressure and photophobia), nasal congestion, postnasal drip, rhinorrhea, a sore throat, shortness of breath, sweats and wheezing. Pertinent negatives include no ear congestion, ear pain, hemoptysis or weight loss. Risk factors: has been visiting friends in the hospital. Treatments tried: Robitussin DM, neb tx, albuterol inhaler. The treatment provided no relief. Her past medical history is significant for COPD.   Worse in last 3 days.  No COPD exacerbation in about 1 year. Uses chronic O2, usually 2.5L, currently on 3L.  Chest pain occurs with coughing.     Allergies  Allergen Reactions  . Codeine Nausea And Vomiting  . Meloxicam Other (See Comments)    Stomach pain     Current Outpatient Medications:  .  albuterol (VENTOLIN HFA) 108 (90 Base) MCG/ACT inhaler, INHALE 2 PUFFS BY MOUTH FOUR TIMES DAILY AS NEEDED, Disp: , Rfl:  .  ALPRAZolam (XANAX) 0.5 MG tablet, Take 1 tablet (0.5 mg total) by mouth 2 (two) times daily as needed for anxiety. (Patient taking differently: Take 0.25 mg by mouth 2 (two) times daily as needed for anxiety. ), Disp: 60 tablet, Rfl: 0 .  Aspirin-Caffeine (BC FAST PAIN RELIEF ARTHRITIS) 1000-65 MG PACK, Take 1 packet by mouth every 6 (six) hours as needed (Pain)., Disp: , Rfl:  .  bisoprolol-hydrochlorothiazide (ZIAC) 10-6.25 MG tablet, TAKE 1 TABLET BY MOUTH EVERY DAY, Disp: 90 tablet, Rfl: 0 .  Cholecalciferol ([employer] D3) 2000 units CAPS,  Take 1 capsule by mouth daily., Disp: , Rfl:  .  Fluticasone-Salmeterol (ADVAIR DISKUS) 250-50 MCG/DOSE AEPB, INHALE 1 PUFF INTO LUNGS TWICE A DAY, Disp: , Rfl:  .  furosemide (LASIX) 40 MG tablet, Take 1 tablet (40 mg total) by mouth 2 (two) times daily., Disp: 60 tablet, Rfl: 3 .  gabapentin (NEURONTIN) 100 MG capsule, Take 1-3 capsules (100-300 mg total) by mouth at bedtime. Follow written titration schedule., Disp: 90 capsule, Rfl: 2 .  [START ON 11/22/2017] HYDROcodone-acetaminophen (NORCO/VICODIN) 5-325 MG tablet, Take 1 tablet by mouth every 8 (eight) hours as needed for severe pain., Disp: 90 tablet, Rfl: 0 .  ipratropium-albuterol (DUONEB) 0.5-2.5 (3) MG/3ML SOLN, Take 3 mLs by nebulization every 6 (six) hours as needed (shortness of breath)., Disp: 360 mL, Rfl: 3 .  ketoconazole (NIZORAL) 2 % cream, Apply 1 application topically daily., Disp: , Rfl:  .  OXYGEN, Inhale 2.5 L into the lungs continuous. Reported on 11/03/2015, Disp: , Rfl:  .  potassium chloride SA (K-DUR,KLOR-CON) 20 MEQ tablet, Take 1 tablet (20 mEq total) by mouth 2 (two) times daily., Disp: 60 tablet, Rfl: 3 .  ranitidine (ZANTAC) 150 MG tablet, Take 150 mg by mouth 2 (two) times daily., Disp: , Rfl:  .  sertraline (ZOLOFT) 100 MG tablet, TAKE 1.5 TABLET (150 MG TOTAL) BY MOUTH DAILY., Disp: 135 tablet, Rfl: 4  Review of Systems  Constitutional: Positive for chills and fever (up to 101). Negative for weight loss.  HENT: Positive for postnasal drip, rhinorrhea and sore throat. Negative for ear pain.   Respiratory: Positive for cough, shortness of breath and wheezing. Negative for hemoptysis.   Cardiovascular: Positive for chest pain.  Neurological: Positive for headaches (sinus pressure and photophobia).    Social History   Tobacco Use  . Smoking status: Current Every Day Smoker    Packs/day: 1.00    Years: 40.00    Pack years: 40.00    Types: Cigarettes  . Smokeless tobacco: Never Used  . Tobacco comment: first  day of patch patient states she's trying  Substance Use Topics  . Alcohol use: No    Alcohol/week: 0.0 oz   Objective:   BP 124/80 (BP Location: Left Arm, Patient Position: Sitting, Cuff Size: Large)   Pulse 76   Temp 98.4 F (36.9 C) (Oral)   Resp 20   Wt 243 lb (110.2 kg)   SpO2 96%   BMI 44.45 kg/m  Vitals:   11/15/17 1440 11/15/17 1449  BP: 124/80   Pulse: 76   Resp: 20   Temp: 98.4 F (36.9 C)   TempSrc: Oral   SpO2: (!) 79% 96%  Weight: 243 lb (110.2 kg)      Physical Exam  Constitutional: She is oriented to person, place, and time. She appears well-developed and well-nourished. No distress.  HENT:  Head: Normocephalic and atraumatic.  Right Ear: External ear normal.  Left Ear: External ear normal.  Nose: Nose normal.  Mouth/Throat: Oropharynx is clear and moist.  Eyes: Conjunctivae and EOM are normal. Pupils are equal, round, and reactive to light. No scleral icterus.  Neck: Neck supple.  Cardiovascular: Normal rate, regular rhythm, normal heart sounds and intact distal pulses.  No murmur heard. Pulmonary/Chest: Effort normal. No respiratory distress. She has wheezes (diffusely, worse in bilateral bases). She exhibits tenderness.  Musculoskeletal: She exhibits no edema.  Lymphadenopathy:    She has no cervical adenopathy.  Neurological: She is alert and oriented to person, place, and time.  Skin: Skin is warm and dry. No rash noted.  Psychiatric: She has a normal mood and affect.  Vitals reviewed.       Assessment & Plan:          1. COPD with acute exacerbation ([place]) - history consistent with COPD exacerbation - chest pain reproducible and c/w chest wall pain - check CXR - treat with prednisone, doxycycline, and regular duonebs - continue controller Advair - reutnr precautions discussed - ipratropium-albuterol (DUONEB) 0.5-2.5 (3) MG/3ML SOLN; Take 3 mLs by nebulization every 6 (six) hours as needed (shortness of breath).  Dispense: 360 mL;  Refill: 3 - DG Chest 2 View; Future    Meds ordered this encounter  Medications  . ipratropium-albuterol (DUONEB) 0.5-2.5 (3) MG/3ML SOLN    Sig: Take 3 mLs by nebulization every 6 (six) hours as needed (shortness of breath).    Dispense:  360 mL    Refill:  3  . doxycycline (VIBRA-TABS) 100 MG tablet    Sig: Take 1 tablet (100 mg total) by mouth 2 (two) times daily for 7 days.    Dispense:  14 tablet    Refill:  0  . predniSONE (DELTASONE) 50 MG tablet    Sig: Take 1 tablet (50 mg total) by mouth daily with breakfast.    Dispense:  5 tablet    Refill:  0  . albuterol (PROVENTIL HFA;VENTOLIN HFA) 108 (90 Base) MCG/ACT inhaler    Sig: Inhale 2 puffs into the  lungs every 6 (six) hours as needed for wheezing or shortness of breath.    Dispense:  1 Inhaler    Refill:  5     Return if symptoms worsen or fail to improve.   The entirety of the information documented in the History of Present Illness, Review of Systems and Physical Exam were personally obtained by me. Portions of this information were initially documented by Raquel Sarna Ratchford, CMA and reviewed by me for thoroughness and accuracy.    Virginia Crews, MD, MPH [facility] 11/15/2017 3:33 PM

## 2017-11-15 NOTE — Telephone Encounter (Signed)
Patient called office today with complaints of cough, wheezing and shortness of breath that began on Sunday, patient states in the past two days symptoms gave gradually been worse Patient reports episodes of light headiness and dizziness. Patient states that she has a history of COPD and has been using nebulizer at home and Duoneb, associated with cough and shortness of breath patient also reported congestion and sinus pressure. Patient was unable to come in office this morning appt was scheduled for this afternoon at 2:45PM, she was instructed to be at office at 2:30PM. Amparo Bristol

## 2017-11-16 ENCOUNTER — Telehealth: Payer: Self-pay

## 2017-11-16 NOTE — Telephone Encounter (Signed)
Pt advised and agrees with tx plan.

## 2017-11-16 NOTE — Telephone Encounter (Signed)
-----  Message from Virginia Crews, MD sent at 11/16/2017  8:49 AM EST ----- No acute pneumonia.  There are signs of COPD as expected.  Treatment as discussed  Virginia Crews, MD, MPH [facility] 11/16/2017 8:49 AM

## 2017-11-22 DIAGNOSIS — J9610 Chronic respiratory failure, unspecified whether with hypoxia or hypercapnia: Secondary | ICD-10-CM | POA: Diagnosis not present

## 2017-11-23 ENCOUNTER — Ambulatory Visit (INDEPENDENT_AMBULATORY_CARE_PROVIDER_SITE_OTHER): Payer: Medicare HMO | Admitting: Family Medicine

## 2017-11-23 ENCOUNTER — Encounter: Payer: Self-pay | Admitting: Family Medicine

## 2017-11-23 VITALS — BP 118/62 | HR 75 | Temp 99.0°F

## 2017-11-23 DIAGNOSIS — J440 Chronic obstructive pulmonary disease with acute lower respiratory infection: Secondary | ICD-10-CM

## 2017-11-23 DIAGNOSIS — Z9981 Dependence on supplemental oxygen: Secondary | ICD-10-CM | POA: Diagnosis not present

## 2017-11-23 DIAGNOSIS — Z720 Tobacco use: Secondary | ICD-10-CM

## 2017-11-23 DIAGNOSIS — J209 Acute bronchitis, unspecified: Secondary | ICD-10-CM

## 2017-11-23 MED ORDER — LEVOFLOXACIN 500 MG PO TABS: 500.0000 mg | ORAL_TABLET | Freq: Every day | ORAL | 0 refills | Status: DC

## 2017-11-23 MED ORDER — PREDNISONE 5 MG PO TABS: 5.0000 mg | ORAL_TABLET | Freq: Every day | ORAL | 0 refills | Status: DC

## 2017-11-23 NOTE — Progress Notes (Signed)
Patient: Theresa [name] Female    DOB: 06-28-1956   62 y.o.   MRN: [id number] Visit Date: 11/23/2017  Today's Provider: [name], PA   Chief Complaint  Patient presents with  . URI   Subjective:    HPI COPD with acute exacerbation:  Patient presents for a follow up. Last OV on 11/15/17. CXR confirmed COPD but no pneumonia. Patient was advised to start prednisone, doxycycline, regular duonebs and continue controller Advair inhaler. Patient reports good compliance with treatment plan. She completed all the medications prescribed. She states symptoms are slightly improved. Current symptoms include productive cough (thick green and yellow sputum), chest pain, chills, fever (up to 101), headaches (sinus pressure and photophobia), nasal congestion, postnasal drip, rhinorrhea, a sore throat, shortness of breath, sweats and wheezing.  Past Medical History:  Diagnosis Date  . Anxiety   . Carpal tunnel syndrome   . CHF (congestive heart failure) ([place])   . CHF (congestive heart failure) ([place])    1/18  . Chronic generalized abdominal pain   . Chronic rhinitis   . COPD (chronic obstructive pulmonary disease) ([place])   . DDD (degenerative disc disease), cervical   . Depression   . Edema   . Flu    1/18  . Gastritis   . Hematuria   . Hypertension   . Kidney stones   . Low back pain   . Lumbar radiculitis   . Malodorous urine   . Muscle weakness   . Obesity   . Renal cyst   . Sensory urge incontinence   . Tobacco abuse    Past Surgical History:  Procedure Laterality Date  . ABDOMINAL HYSTERECTOMY    . CARPAL TUNNEL RELEASE Left   . TONSILLECTOMY    . TUBAL LIGATION     Family History  Problem Relation Age of Onset  . Heart disease Mother   . Stroke Mother   . Coronary artery disease Mother   . Lung cancer Sister   . Alcohol abuse Father   . Heart disease Father   . Prostate cancer Neg Hx   . Kidney cancer Neg Hx   . Bladder Cancer Neg Hx    Allergies  Allergen  Reactions  . Codeine Nausea And Vomiting  . Meloxicam Other (See Comments)    Stomach pain     Current Outpatient Medications:  .  albuterol (PROVENTIL HFA;VENTOLIN HFA) 108 (90 Base) MCG/ACT inhaler, Inhale 2 puffs into the lungs every 6 (six) hours as needed for wheezing or shortness of breath., Disp: 1 Inhaler, Rfl: 5 .  ALPRAZolam (XANAX) 0.5 MG tablet, Take 1 tablet (0.5 mg total) by mouth 2 (two) times daily as needed for anxiety. (Patient taking differently: Take 0.25 mg by mouth 2 (two) times daily as needed for anxiety. ), Disp: 60 tablet, Rfl: 0 .  Aspirin-Caffeine (BC FAST PAIN RELIEF ARTHRITIS) 1000-65 MG PACK, Take 1 packet by mouth every 6 (six) hours as needed (Pain)., Disp: , Rfl:  .  bisoprolol-hydrochlorothiazide (ZIAC) 10-6.25 MG tablet, TAKE 1 TABLET BY MOUTH EVERY DAY, Disp: 90 tablet, Rfl: 0 .  Cholecalciferol ([employer] D3) 2000 units CAPS, Take 1 capsule by mouth daily., Disp: , Rfl:  .  Fluticasone-Salmeterol (ADVAIR DISKUS) 250-50 MCG/DOSE AEPB, INHALE 1 PUFF INTO LUNGS TWICE A DAY, Disp: , Rfl:  .  furosemide (LASIX) 40 MG tablet, Take 1 tablet (40 mg total) by mouth 2 (two) times daily., Disp: 60 tablet, Rfl: 3 .  gabapentin (NEURONTIN) 100 MG  capsule, Take 1-3 capsules (100-300 mg total) by mouth at bedtime. Follow written titration schedule., Disp: 90 capsule, Rfl: 2 .  HYDROcodone-acetaminophen (NORCO/VICODIN) 5-325 MG tablet, Take 1 tablet by mouth every 8 (eight) hours as needed for severe pain., Disp: 90 tablet, Rfl: 0 .  ipratropium-albuterol (DUONEB) 0.5-2.5 (3) MG/3ML SOLN, Take 3 mLs by nebulization every 6 (six) hours as needed (shortness of breath)., Disp: 360 mL, Rfl: 3 .  ketoconazole (NIZORAL) 2 % cream, Apply 1 application topically daily., Disp: , Rfl:  .  OXYGEN, Inhale 2.5 L into the lungs continuous. Reported on 11/03/2015, Disp: , Rfl:  .  potassium chloride SA (K-DUR,KLOR-CON) 20 MEQ tablet, Take 1 tablet (20 mEq total) by mouth 2 (two) times daily.,  Disp: 60 tablet, Rfl: 3 .  predniSONE (DELTASONE) 50 MG tablet, Take 1 tablet (50 mg total) by mouth daily with breakfast., Disp: 5 tablet, Rfl: 0 .  ranitidine (ZANTAC) 150 MG tablet, Take 150 mg by mouth 2 (two) times daily., Disp: , Rfl:  .  sertraline (ZOLOFT) 100 MG tablet, TAKE 1.5 TABLET (150 MG TOTAL) BY MOUTH DAILY., Disp: 135 tablet, Rfl: 4     Review of Systems  Constitutional: Positive for activity change.  HENT: Positive for congestion, postnasal drip, rhinorrhea, sinus pressure and sore throat.   Respiratory: Positive for cough, shortness of breath and wheezing.   Cardiovascular: Positive for chest pain.  Musculoskeletal: Positive for back pain and myalgias.  Neurological: Positive for headaches.    Social History   Tobacco Use  . Smoking status: Current Every Day Smoker    Packs/day: 1.00    Years: 40.00    Pack years: 40.00    Types: Cigarettes  . Smokeless tobacco: Never Used  . Tobacco comment: first day of patch patient states she's trying  Substance Use Topics  . Alcohol use: No    Alcohol/week: 0.0 oz   Objective:   BP 118/62 (BP Location: Right Arm, Patient Position: Sitting, Cuff Size: Normal)   Pulse 75   Temp 99 F (37.2 C) (Oral)   SpO2 93% Comment: 2.5 liters of O2 Wt Readings from Last 3 Encounters:  11/15/17 243 lb (110.2 kg)  11/08/17 241 lb (109.3 kg)  11/08/17 241 lb 2 oz (109.4 kg)     Physical Exam  Constitutional: She is oriented to person, place, and time. She appears well-developed and well-nourished.  obese  HENT:  Head: Normocephalic.  Right Ear: External ear normal.  Left Ear: External ear normal.  Nose: Nose normal.  Cobblestone appearance of posterior pharynx without exudates.  Eyes: Conjunctivae are normal.  Neck: Neck supple.  Cardiovascular: Normal rate and regular rhythm.  Pulmonary/Chest: She is in respiratory distress. She has wheezes. She has no rales. She exhibits no tenderness.  Abdominal: Soft. Bowel sounds  are normal.  Lymphadenopathy:    She has no cervical adenopathy.  Neurological: She is alert and oriented to person, place, and time.  Skin: Skin is warm. No rash noted.      Assessment & Plan:     1. Acute bronchitis with COPD ([place]) Continues to have some cough, dyspnea, wheeze and malaise. No peripheral edema. Finished the short prednisone course and Doxycycline. CXR on 11-15-17 did not show any pneumonia. Continues to use Duoneb by home nebulizer 3-4 times a day with Albuterol prn rescue when away from home. Low grade fever today. Should use Mucinex-DM, change to Levaquin 500 mg qd and 12 prednisone taper. Keep appointment with pulmonologist, pain management,  cardiologist and GYN as scheduled over the next few months. Increase fluid intake and recheck here if no better in 2 weeks. - levofloxacin (LEVAQUIN) 500 MG tablet; Take 1 tablet (500 mg total) by mouth daily.  Dispense: 7 tablet; Refill: 0 - predniSONE (DELTASONE) 5 MG tablet; Take 1 tablet (5 mg total) by mouth daily with breakfast. Taper down by one tablet daily as directed over 12 days. (6,6,5,5,4,4,3,3,2,2,1,1)  Dispense: 42 tablet; Refill: 0  2. Supplemental oxygen dependent Continues to use oxygen 24 hours a day at 2-2.5 LPM by nasal cannula. Pulse oximetry was 93% today on oxygen.  3. Tobacco abuse Still smoking despite dyspnea and need for supplemental oxygen. Counseled regarding need to stop all smoking.       [name], PA  [facility]

## 2017-11-27 ENCOUNTER — Encounter: Payer: Self-pay | Admitting: Pain Medicine

## 2017-11-27 ENCOUNTER — Ambulatory Visit: Payer: Medicare HMO | Attending: Pain Medicine | Admitting: Pain Medicine

## 2017-11-27 VITALS — BP 112/72 | HR 78 | Temp 98.5°F | Resp 16 | Ht 63.0 in | Wt 238.0 lb

## 2017-11-27 DIAGNOSIS — R079 Chest pain, unspecified: Secondary | ICD-10-CM | POA: Diagnosis not present

## 2017-11-27 DIAGNOSIS — Z811 Family history of alcohol abuse and dependence: Secondary | ICD-10-CM | POA: Insufficient documentation

## 2017-11-27 DIAGNOSIS — J449 Chronic obstructive pulmonary disease, unspecified: Secondary | ICD-10-CM | POA: Insufficient documentation

## 2017-11-27 DIAGNOSIS — Z7952 Long term (current) use of systemic steroids: Secondary | ICD-10-CM | POA: Insufficient documentation

## 2017-11-27 DIAGNOSIS — M47816 Spondylosis without myelopathy or radiculopathy, lumbar region: Secondary | ICD-10-CM | POA: Diagnosis not present

## 2017-11-27 DIAGNOSIS — F329 Major depressive disorder, single episode, unspecified: Secondary | ICD-10-CM | POA: Diagnosis not present

## 2017-11-27 DIAGNOSIS — M25511 Pain in right shoulder: Secondary | ICD-10-CM | POA: Insufficient documentation

## 2017-11-27 DIAGNOSIS — G894 Chronic pain syndrome: Secondary | ICD-10-CM

## 2017-11-27 DIAGNOSIS — M545 Low back pain: Secondary | ICD-10-CM

## 2017-11-27 DIAGNOSIS — M5416 Radiculopathy, lumbar region: Secondary | ICD-10-CM | POA: Diagnosis not present

## 2017-11-27 DIAGNOSIS — G8929 Other chronic pain: Secondary | ICD-10-CM | POA: Diagnosis not present

## 2017-11-27 DIAGNOSIS — I110 Hypertensive heart disease with heart failure: Secondary | ICD-10-CM | POA: Diagnosis not present

## 2017-11-27 DIAGNOSIS — Z9981 Dependence on supplemental oxygen: Secondary | ICD-10-CM | POA: Insufficient documentation

## 2017-11-27 DIAGNOSIS — M5417 Radiculopathy, lumbosacral region: Secondary | ICD-10-CM | POA: Diagnosis not present

## 2017-11-27 DIAGNOSIS — M48061 Spinal stenosis, lumbar region without neurogenic claudication: Secondary | ICD-10-CM | POA: Insufficient documentation

## 2017-11-27 DIAGNOSIS — Z79899 Other long term (current) drug therapy: Secondary | ICD-10-CM | POA: Insufficient documentation

## 2017-11-27 DIAGNOSIS — Z7982 Long term (current) use of aspirin: Secondary | ICD-10-CM | POA: Diagnosis not present

## 2017-11-27 DIAGNOSIS — R0902 Hypoxemia: Secondary | ICD-10-CM | POA: Diagnosis not present

## 2017-11-27 DIAGNOSIS — Z885 Allergy status to narcotic agent status: Secondary | ICD-10-CM | POA: Insufficient documentation

## 2017-11-27 DIAGNOSIS — M25552 Pain in left hip: Secondary | ICD-10-CM

## 2017-11-27 DIAGNOSIS — M1610 Unilateral primary osteoarthritis, unspecified hip: Secondary | ICD-10-CM | POA: Insufficient documentation

## 2017-11-27 DIAGNOSIS — D689 Coagulation defect, unspecified: Secondary | ICD-10-CM | POA: Insufficient documentation

## 2017-11-27 DIAGNOSIS — Z801 Family history of malignant neoplasm of trachea, bronchus and lung: Secondary | ICD-10-CM | POA: Insufficient documentation

## 2017-11-27 DIAGNOSIS — I5032 Chronic diastolic (congestive) heart failure: Secondary | ICD-10-CM | POA: Diagnosis not present

## 2017-11-27 DIAGNOSIS — M79605 Pain in left leg: Secondary | ICD-10-CM

## 2017-11-27 DIAGNOSIS — R82998 Other abnormal findings in urine: Secondary | ICD-10-CM | POA: Insufficient documentation

## 2017-11-27 DIAGNOSIS — G4730 Sleep apnea, unspecified: Secondary | ICD-10-CM | POA: Diagnosis not present

## 2017-11-27 DIAGNOSIS — M5116 Intervertebral disc disorders with radiculopathy, lumbar region: Secondary | ICD-10-CM | POA: Diagnosis not present

## 2017-11-27 DIAGNOSIS — Z9851 Tubal ligation status: Secondary | ICD-10-CM | POA: Insufficient documentation

## 2017-11-27 DIAGNOSIS — M7918 Myalgia, other site: Secondary | ICD-10-CM | POA: Diagnosis not present

## 2017-11-27 DIAGNOSIS — M25551 Pain in right hip: Secondary | ICD-10-CM

## 2017-11-27 DIAGNOSIS — Z9889 Other specified postprocedural states: Secondary | ICD-10-CM | POA: Insufficient documentation

## 2017-11-27 DIAGNOSIS — J310 Chronic rhinitis: Secondary | ICD-10-CM | POA: Insufficient documentation

## 2017-11-27 DIAGNOSIS — Z888 Allergy status to other drugs, medicaments and biological substances status: Secondary | ICD-10-CM | POA: Insufficient documentation

## 2017-11-27 DIAGNOSIS — Z8249 Family history of ischemic heart disease and other diseases of the circulatory system: Secondary | ICD-10-CM | POA: Insufficient documentation

## 2017-11-27 DIAGNOSIS — Z90710 Acquired absence of both cervix and uterus: Secondary | ICD-10-CM | POA: Insufficient documentation

## 2017-11-27 DIAGNOSIS — Z823 Family history of stroke: Secondary | ICD-10-CM | POA: Insufficient documentation

## 2017-11-27 DIAGNOSIS — G8918 Other acute postprocedural pain: Secondary | ICD-10-CM | POA: Insufficient documentation

## 2017-11-27 DIAGNOSIS — G5603 Carpal tunnel syndrome, bilateral upper limbs: Secondary | ICD-10-CM | POA: Insufficient documentation

## 2017-11-27 DIAGNOSIS — E559 Vitamin D deficiency, unspecified: Secondary | ICD-10-CM | POA: Diagnosis not present

## 2017-11-27 DIAGNOSIS — N281 Cyst of kidney, acquired: Secondary | ICD-10-CM | POA: Insufficient documentation

## 2017-11-27 DIAGNOSIS — F17210 Nicotine dependence, cigarettes, uncomplicated: Secondary | ICD-10-CM | POA: Insufficient documentation

## 2017-11-27 DIAGNOSIS — K2970 Gastritis, unspecified, without bleeding: Secondary | ICD-10-CM | POA: Insufficient documentation

## 2017-11-27 DIAGNOSIS — Z87442 Personal history of urinary calculi: Secondary | ICD-10-CM | POA: Insufficient documentation

## 2017-11-27 NOTE — Patient Instructions (Addendum)
____________________________________________________________________________________________  Preparing for Procedure with Sedation Instructions: . Oral Intake: Do not eat or drink anything for at least 8 hours prior to your procedure. . Transportation: Public transportation is not allowed. Bring an adult driver. The driver must be physically present in our waiting room before any procedure can be started. [facility] Physical Assistance: Bring an adult physically capable of assisting you, in the event you need help. This adult should keep you company at home for at least 6 hours after the procedure. . Blood Pressure Medicine: Take your blood pressure medicine with a sip of water the morning of the procedure. . Blood thinners:  . Diabetics on insulin: Notify the staff so that you can be scheduled 1st case in the morning. If your diabetes requires high dose insulin, take only  of your normal insulin dose the morning of the procedure and notify the staff that you have done so. . Preventing infections: Shower with an antibacterial soap the morning of your procedure. . Build-up your immune system: Take 1000 mg of Vitamin C with every meal (3 times a day) the day prior to your procedure. [place] Kitchen Antibiotics: Inform the staff if you have a condition or reason that requires you to take antibiotics before dental procedures. . Pregnancy: If you are pregnant, call and cancel the procedure. . Sickness: If you have a cold, fever, or any active infections, call and cancel the procedure. . Arrival: You must be in the facility at least 30 minutes prior to your scheduled procedure. . Children: Do not bring children with you. . Dress appropriately: Bring dark clothing that you would not mind if they get stained. . Valuables: Do not bring any jewelry or valuables. Procedure appointments are reserved for interventional treatments only. [place] Kitchen No Prescription Refills. . No medication changes will be discussed during procedure  appointments. . No disability issues will be discussed. ____________________________________________________________________________________________    Epidural Steroid Injection An epidural steroid injection is a shot of steroid medicine and numbing medicine that is given into the space between the spinal cord and the bones in your back (epidural space). The shot helps relieve pain caused by an irritated or swollen nerve root. The amount of pain relief you get from the injection depends on what is causing the nerve to be swollen and irritated, and how long your pain lasts. You are more likely to benefit from this injection if your pain is strong and comes on suddenly rather than if you have had pain for a long time. Tell a health care provider about:  Any allergies you have.  All medicines you are taking, including vitamins, herbs, eye drops, creams, and over-the-counter medicines.  Any problems you or family members have had with anesthetic medicines.  Any blood disorders you have.  Any surgeries you have had.  Any medical conditions you have.  Whether you are pregnant or may be pregnant. What are the risks? Generally, this is a safe procedure. However, problems may occur, including:  Headache.  Bleeding.  Infection.  Allergic reaction to medicines.  Damage to your nerves.  What happens before the procedure? Staying hydrated Follow instructions from your health care provider about hydration, which may include:  Up to 2 hours before the procedure - you may continue to drink clear liquids, such as water, clear fruit juice, black coffee, and plain tea.  Eating and drinking restrictions Follow instructions from your health care provider about eating and drinking, which may include:  8 hours before the procedure - stop eating  heavy meals or foods such as meat, fried foods, or fatty foods.  6 hours before the procedure - stop eating light meals or foods, such as toast or  cereal.  6 hours before the procedure - stop drinking milk or drinks that contain milk.  2 hours before the procedure - stop drinking clear liquids.  Medicine  You may be given medicines to lower anxiety.  Ask your health care provider about: ? Changing or stopping your regular medicines. This is especially important if you are taking diabetes medicines or blood thinners. ? Taking medicines such as aspirin and ibuprofen. These medicines can thin your blood. Do not take these medicines before your procedure if your health care provider instructs you not to. General instructions  Plan to have someone take you home from the hospital or clinic. What happens during the procedure?  You may receive a medicine to help you relax (sedative).  You will be asked to lie on your abdomen.  The injection site will be cleaned.  A numbing medicine (local anesthetic) will be used to numb the injection site.  A needle will be inserted through your skin into the epidural space. You may feel some discomfort when this happens. An X-ray machine will be used to make sure the needle is put as close as possible to the affected nerve.  A steroid medicine and a local anesthetic will be injected into the epidural space.  The needle will be removed.  A bandage (dressing) will be put over the injection site. What happens after the procedure?  Your blood pressure, heart rate, breathing rate, and blood oxygen level will be monitored until the medicines you were given have worn off.  Your arm or leg may feel weak or numb for a few hours.  The injection site may feel sore.  Do not drive for 24 hours if you received a sedative. This information is not intended to replace advice given to you by your health care provider. Make sure you discuss any questions you have with your health care provider. Document Released: 01/10/2008 Document Revised: 03/16/2016 Document Reviewed: 01/19/2016 Elsevier Interactive  Patient Education  [employer].

## 2017-11-27 NOTE — Progress Notes (Signed)
Patient's Name: Theresa [name]  MRN: [id number]  Referring Provider: Margo Common, [place]  DOB: [birth year]  PCP: Margo Common, PA  DOS: 11/27/2017  Note by: Gaspar Cola, MD  Service setting: Ambulatory outpatient  Specialty: Interventional Pain Management  Location: [facility] (AMB) Pain Management Facility    Patient type: Established   Primary Reason(s) for Visit: Encounter for post-procedure evaluation of chronic illness with mild to moderate exacerbation CC: Back Pain (lower, bilateral)  HPI  Theresa [name] is a 62 y.o. year old, female patient, who comes today for a post-procedure evaluation. She has Neuritis or radiculitis due to rupture of lumbar intervertebral disc; Decreased motor strength; Essential (primary) hypertension; Clinical depression; DDD (degenerative disc disease), lumbar; CAFL (chronic airflow limitation) ([place]); Chronic rhinitis; Carpal tunnel syndrome; Blood clotting disorder ([place]); Anxiety state; Chronic diastolic heart failure ([place]); Chest pain; Tobacco abuse; Encounter for therapeutic drug level monitoring; Encounter for chronic pain management; Pain management; Neurogenic pain; Neuropathic pain; Musculoskeletal pain; Lumbar spondylosis (L4-5); Chronic low back pain (Primary Area of Pain) (Left); Chronic lower extremity pain (Secondary Area of Pain) (Left); Chronic lumbar radicular pain (L4 & S1 Dermatome) (Left); Chronic shoulder pain (Right side); Chronic upper extremity pain (Right-sided); Cervical radiculitis (Right side); Abnormal x-ray of lumbar spine; Chronic pain syndrome (significant psychosocial component); 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; Coagulation disorder ([place]); 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); Obesity, Class III, BMI 40-49.9 (morbid obesity) ([place]); 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[place]); Vitamin D deficiency; Long term current use of opiate analgesic; Long term prescription opiate use; Opiate use (15 MME/Day);  Lumbar facet syndrome (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); Chronic sacroiliac joint pain (Bilateral); Sleep apnea; Supplemental oxygen dependent; Lumbar facet hypertrophy (multilevel); Lumbar spinal stenosis (L4-5); 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); 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 Her primarily concern today is the Back Pain (lower, bilateral)  Pain Assessment: Location: Lower, Left, Right Back Radiating: pain was going down right leg post procedure. pain is better now in the leg Onset: More than a month ago Duration: Chronic pain Quality: Aching, Constant, Nagging Severity: 5 /10 (self-reported pain score)  Note: Reported level is compatible with observation.                         When using our objective Pain Scale, levels between 6 and 10/10 are said to belong in an emergency room, as it progressively worsens from a 6/10, described as severely limiting, requiring emergency care not usually available at an outpatient pain management facility. At a 6/10 level, communication becomes difficult and requires great effort. Assistance to reach the emergency department may be required. Facial flushing and profuse sweating along with potentially dangerous increases in heart rate and blood pressure will be evident. Effect on ADL: unable to walk very far without taking a break.  Timing: Constant Modifying factors: rest  Theresa [name] comes in today for post-procedure evaluation after the treatment done on  11/05/2017.  The patient comes into the clinic today wearing a mask, apparently due to an upper respiratory infection.  In any case, the patient indicates that  after her radiofrequency she had a flareup of the pain that lasted for a couple weeks, but after that the pain completely went away and she only experiences that pain when she is walking, while in the past she used to be there all the time.  She now feels comfort sitting down as she is experiencing no pain while doing that.  In any case, at this point we will not plan on doing any further procedures due to her upper respiratory tract infection.  The patient's last lumbar MRI not only shows facet arthropathy, but also shows a lumbar disc extrusion with possible compression of the left L4 nerve root.  This pathology goes along with the patient symptomatology and therefore once she gets over her upper respiratory tract infection, we will consider doing some lumbar epidural steroid injections to improve her leg pain.  Further details on both, my assessment(s), as well as the proposed treatment plan, please see below.  Post-Procedure Assessment  11/05/2017 Procedure: Therapeutic right-sided lumbar facet RFA #1 under fluoroscopic guidance and IV sedation Pre-procedure pain score:  5/10 Post-procedure pain score: 5/10 No relief Influential Factors: BMI: 42.16 kg/m Intra-procedural challenges: None observed.         Assessment challenges: None detected.              Reported side-effects: None.        Post-procedural adverse reactions or complications: None reported         Sedation: Sedation provided. When no sedatives are used, the analgesic levels obtained are directly associated to the effectiveness of the local anesthetics. However, when sedation is provided, the level of analgesia obtained during the initial 1 hour following the intervention, is believed to be the result of a combination of factors. These factors may include, but are not limited  to: 1. The effectiveness of the local anesthetics used. 2. The effects of the analgesic(s) and/or anxiolytic(s) used. 3. The degree of discomfort experienced by the patient at the time of the procedure. 4. The patients ability and reliability in recalling and recording the events. 5. The presence and influence of possible secondary gains and/or psychosocial factors. Reported result: Relief experienced during the 1st hour after the procedure: 0 %(patient had difficulty with left leg post procedure prior to leaving office that day of procdure.  Gabapentin was prescribed. ) (Ultra-Short Term Relief)            Interpretative annotation: Clinically appropriate result. Analgesia during this period is likely to be Local Anesthetic and/or IV Sedative (Analgesic/Anxiolytic) related.          Effects of local anesthetic: The analgesic effects attained during this period are directly associated to the localized infiltration of local anesthetics and therefore cary significant diagnostic value as to the etiological location, or anatomical origin, of the pain. Expected duration of relief is directly dependent on the pharmacodynamics of the local anesthetic used. Long-acting (4-6 hours) anesthetics used.  Reported result: Relief during the next 4 to 6 hour after the procedure: 0 % (Short-Term Relief)            Interpretative annotation: Clinically appropriate result. Analgesia during this period is likely to be Local Anesthetic-related.          Long-term benefit: Defined as the period of time past the expected duration of local anesthetics (1 hour for short-acting and 4-6 hours for long-acting). With the possible exception of prolonged sympathetic blockade from the local anesthetics, benefits during this period are typically attributed to, or  associated with, other factors such as analgesic sensory neuropraxia, antiinflammatory effects, or beneficial biochemical changes provided by agents other than the local  anesthetics.  Reported result: Extended relief following procedure: 100 %(sitting down there is no pain.  when walking the pain is there rated at approx 5.  ) (Long-Term Relief)            Interpretative annotation: Clinically appropriate result. Good relief. No permanent benefit expected. Inflammation plays a part in the etiology to the pain.          Current benefits: Defined as reported results that persistent at this point in time.   Analgesia: >50 %            Function: Somewhat improved ROM: Somewhat improved Interpretative annotation: Recurrence of symptoms. No permanent benefit expected. Effective diagnostic intervention.          Interpretation: Results would suggest a successful diagnostic intervention.                  Plan:  Please see "Plan of Care" for details.        Laboratory Chemistry  Inflammation Markers (CRP: Acute Phase) (ESR: Chronic Phase) Lab Results  Component Value Date   CRP 9.1 (H) 08/08/2017   ESRSEDRATE 16 08/08/2017   LATICACIDVEN 1.0 02/13/2017                 Renal Function Markers Lab Results  Component Value Date   BUN 22 (H) 10/17/2017   CREATININE 0.70 10/17/2017   GFRAA >60 10/17/2017   GFRNONAA >60 10/17/2017                 Hepatic Function Markers Lab Results  Component Value Date   AST 22 10/17/2017   ALT 25 10/17/2017   ALBUMIN 4.0 10/17/2017   ALKPHOS 83 10/17/2017   LIPASE 31 10/17/2017                 Electrolytes Lab Results  Component Value Date   NA 140 10/17/2017   K 3.9 10/17/2017   CL 102 10/17/2017   CALCIUM 9.3 10/17/2017   MG 2.1 08/08/2017   PHOS 3.9 10/25/2016                 Neuropathy Markers Lab Results  Component Value Date   VITAMINB12 501 08/08/2017   HIV Non Reactive 02/14/2017                 Bone Pathology Markers Lab Results  Component Value Date   25OHVITD1 13 (L) 09/17/2015   25OHVITD2 <1.0 09/17/2015   25OHVITD3 13 09/17/2015                 Coagulation Parameters Lab Results   Component Value Date   PLT 167 10/17/2017                 Cardiovascular Markers Lab Results  Component Value Date   BNP 40.2 05/11/2017   CKTOTAL 72 12/27/2012   CKMB 1.0 12/27/2012   TROPONINI <0.03 10/17/2017   HGB 15.6 10/17/2017   HCT 46.3 10/17/2017                 Note: Lab results reviewed.  Recent Diagnostic Imaging Results  DG Chest 2 View CLINICAL DATA:  COPD.  Productive cough.  Wheezing.  EXAM: CHEST  2 VIEW  COMPARISON:  None.  FINDINGS: Normal heart size. Normal mediastinal contour. No pneumothorax. No pleural effusion. Hyperinflated lungs. No pulmonary edema.  Mild scarring versus atelectasis at anterior lung bases, unchanged. No acute consolidative airspace disease.  IMPRESSION: 1. No acute cardiopulmonary disease. 2. Hyperinflated lungs, compatible with the provided history of COPD. 3. Mild chronic scarring versus atelectasis at the anterior lung bases.  Electronically Signed   By: Ilona Sorrel M.D.   On: 11/16/2017 08:08  Complexity Note: Imaging results reviewed. Results shared with Ms. Hattery, using Layman's terms.                         Meds   Current Outpatient Medications:  .  ALPRAZolam (XANAX) 0.5 MG tablet, Take 1 tablet (0.5 mg total) by mouth 2 (two) times daily as needed for anxiety. (Patient taking differently: Take 0.25 mg by mouth 2 (two) times daily as needed for anxiety. ), Disp: 60 tablet, Rfl: 0 .  Aspirin-Caffeine (BC FAST PAIN RELIEF ARTHRITIS) 1000-65 MG PACK, Take 1 packet by mouth every 6 (six) hours as needed (Pain)., Disp: , Rfl:  .  bisoprolol-hydrochlorothiazide (ZIAC) 10-6.25 MG tablet, TAKE 1 TABLET BY MOUTH EVERY DAY, Disp: 90 tablet, Rfl: 0 .  Cholecalciferol ([employer] D3) 2000 units CAPS, Take 1 capsule by mouth daily., Disp: , Rfl:  .  Fluticasone-Salmeterol (ADVAIR DISKUS) 250-50 MCG/DOSE AEPB, INHALE 1 PUFF INTO LUNGS TWICE A DAY, Disp: , Rfl:  .  furosemide (LASIX) 40 MG tablet, Take 1 tablet (40 mg total) by  mouth 2 (two) times daily., Disp: 60 tablet, Rfl: 3 .  gabapentin (NEURONTIN) 100 MG capsule, Take 1-3 capsules (100-300 mg total) by mouth at bedtime. Follow written titration schedule., Disp: 90 capsule, Rfl: 2 .  HYDROcodone-acetaminophen (NORCO/VICODIN) 5-325 MG tablet, Take 1 tablet by mouth every 8 (eight) hours as needed for severe pain., Disp: 90 tablet, Rfl: 0 .  ipratropium-albuterol (DUONEB) 0.5-2.5 (3) MG/3ML SOLN, Take 3 mLs by nebulization every 6 (six) hours as needed (shortness of breath)., Disp: 360 mL, Rfl: 3 .  ketoconazole (NIZORAL) 2 % cream, Apply 1 application topically daily., Disp: , Rfl:  .  levofloxacin (LEVAQUIN) 500 MG tablet, Take 1 tablet (500 mg total) by mouth daily., Disp: 7 tablet, Rfl: 0 .  OXYGEN, Inhale 2.5 L into the lungs continuous. Reported on 11/03/2015, Disp: , Rfl:  .  potassium chloride SA (K-DUR,KLOR-CON) 20 MEQ tablet, Take 1 tablet (20 mEq total) by mouth 2 (two) times daily., Disp: 60 tablet, Rfl: 3 .  predniSONE (DELTASONE) 5 MG tablet, Take 1 tablet (5 mg total) by mouth daily with breakfast. Taper down by one tablet daily as directed over 12 days. (6,6,5,5,4,4,3,3,2,2,1,1), Disp: 42 tablet, Rfl: 0 .  ranitidine (ZANTAC) 150 MG tablet, Take 150 mg by mouth 2 (two) times daily., Disp: , Rfl:  .  sertraline (ZOLOFT) 100 MG tablet, TAKE 1.5 TABLET (150 MG TOTAL) BY MOUTH DAILY., Disp: 135 tablet, Rfl: 4  ROS  Constitutional: Denies any fever or chills Gastrointestinal: No reported hemesis, hematochezia, vomiting, or acute GI distress Musculoskeletal: Denies any acute onset joint swelling, redness, loss of ROM, or weakness Neurological: No reported episodes of acute onset apraxia, aphasia, dysarthria, agnosia, amnesia, paralysis, loss of coordination, or loss of consciousness  Allergies  Ms. Hewins is allergic to codeine and meloxicam.  PFSH  Drug: Ms. Cullifer  reports that she does not use drugs. Alcohol:  reports that she does not drink  alcohol. Tobacco:  reports that she has been smoking cigarettes.  She has a 40.00 pack-year smoking history. she has never used smokeless  tobacco. Medical:  has a past medical history of Acute postoperative pain (10/03/2017), Anxiety, Carpal tunnel syndrome, CHF (congestive heart failure) ([place]), CHF (congestive heart failure) ([place]), Chronic generalized abdominal pain, Chronic rhinitis, COPD (chronic obstructive pulmonary disease) ([place]), DDD (degenerative disc disease), cervical, Depression, Edema, Flu, Gastritis, Hematuria, Hypertension, Kidney stones, Low back pain, Lumbar radiculitis, Malodorous urine, Muscle weakness, Obesity, Renal cyst, Sensory urge incontinence, and Tobacco abuse. Surgical: Ms. Montemayor  has a past surgical history that includes Tubal ligation; Abdominal hysterectomy; Tonsillectomy; and Carpal tunnel release (Left). Family: family history includes Alcohol abuse in her father; Coronary artery disease in her mother; Heart disease in her father and mother; Lung cancer in her sister; Stroke in her mother.  Constitutional Exam  General appearance: Well nourished, well developed, and well hydrated. In no apparent acute distress Vitals:   11/27/17 1345  BP: 112/72  Pulse: 78  Resp: 16  Temp: 98.5 F (36.9 C)  TempSrc: Oral  SpO2: 97%  Weight: 238 lb (108 kg)  Height: _0  (1.6 m)   BMI Assessment: Estimated body mass index is 42.16 kg/m as calculated from the following:   Height as of this encounter: _1  (1.6 m).   Weight as of this encounter: 238 lb (108 kg).  BMI interpretation table: BMI level Category Range association with higher incidence of chronic pain  <18 kg/m2 Underweight   18.5-24.9 kg/m2 Ideal body weight   25-29.9 kg/m2 Overweight Increased incidence by 20%  30-34.9 kg/m2 Obese (Class I) Increased incidence by 68%  35-39.9 kg/m2 Severe obesity (Class II) Increased incidence by 136%  >40 kg/m2 Extreme obesity (Class III) Increased incidence by 254%    BMI Readings from Last 4 Encounters:  11/27/17 42.16 kg/m  11/15/17 44.45 kg/m  11/08/17 44.08 kg/m  11/08/17 44.10 kg/m   Wt Readings from Last 4 Encounters:  11/27/17 238 lb (108 kg)  11/15/17 243 lb (110.2 kg)  11/08/17 241 lb (109.3 kg)  11/08/17 241 lb 2 oz (109.4 kg)  Psych/Mental status: Alert, oriented x 3 (person, place, & time)       Eyes: PERLA Respiratory: Oxygen-dependent COPD  Cervical Spine Area Exam  Skin & Axial Inspection: No masses, redness, edema, swelling, or associated skin lesions Alignment: Symmetrical Functional ROM: Unrestricted ROM      Stability: No instability detected Muscle Tone/Strength: Functionally intact. No obvious neuro-muscular anomalies detected. Sensory (Neurological): Unimpaired Palpation: No palpable anomalies              Upper Extremity (UE) Exam    Side: Right upper extremity  Side: Left upper extremity  Skin & Extremity Inspection: Skin color, temperature, and hair growth are WNL. No peripheral edema or cyanosis. No masses, redness, swelling, asymmetry, or associated skin lesions. No contractures.  Skin & Extremity Inspection: Skin color, temperature, and hair growth are WNL. No peripheral edema or cyanosis. No masses, redness, swelling, asymmetry, or associated skin lesions. No contractures.  Functional ROM: Unrestricted ROM          Functional ROM: Unrestricted ROM          Muscle Tone/Strength: Functionally intact. No obvious neuro-muscular anomalies detected.  Muscle Tone/Strength: Functionally intact. No obvious neuro-muscular anomalies detected.  Sensory (Neurological): Unimpaired          Sensory (Neurological): Unimpaired          Palpation: No palpable anomalies              Palpation: No palpable anomalies  Specialized Test(s): Deferred         Specialized Test(s): Deferred          Thoracic Spine Area Exam  Skin & Axial Inspection: No masses, redness, or swelling Alignment: Symmetrical Functional ROM:  Unrestricted ROM Stability: No instability detected Muscle Tone/Strength: Functionally intact. No obvious neuro-muscular anomalies detected. Sensory (Neurological): Unimpaired Muscle strength & Tone: No palpable anomalies  Lumbar Spine Area Exam  Skin & Axial Inspection: No masses, redness, or swelling Alignment: Symmetrical Functional ROM: Minimal ROM      Stability: No instability detected Muscle Tone/Strength: Functionally intact. No obvious neuro-muscular anomalies detected. Sensory (Neurological): Movement-associated discomfort Palpation: No palpable anomalies       Provocative Tests: Lumbar Hyperextension and rotation test: evaluation deferred today       Lumbar Lateral bending test: evaluation deferred today       Patrick's Maneuver: evaluation deferred today                    Gait & Posture Assessment  Ambulation: Patient came in today in a wheel chair Gait: Very limited, using assistive device to ambulate Posture: Antalgic   Lower Extremity Exam    Side: Right lower extremity  Side: Left lower extremity  Skin & Extremity Inspection: Skin color, temperature, and hair growth are WNL. No peripheral edema or cyanosis. No masses, redness, swelling, asymmetry, or associated skin lesions. No contractures.  Skin & Extremity Inspection: Skin color, temperature, and hair growth are WNL. No peripheral edema or cyanosis. No masses, redness, swelling, asymmetry, or associated skin lesions. No contractures.  Functional ROM: Unrestricted ROM          Functional ROM: Unrestricted ROM          Muscle Tone/Strength: Functionally intact. No obvious neuro-muscular anomalies detected.  Muscle Tone/Strength: Functionally intact. No obvious neuro-muscular anomalies detected.  Sensory (Neurological): Unimpaired  Sensory (Neurological): Unimpaired  Palpation: No palpable anomalies  Palpation: No palpable anomalies   Assessment  Primary Diagnosis & Pertinent Problem List: The primary encounter  diagnosis was Chronic lower extremity pain (Secondary Area of Pain) (Left). Diagnoses of Lumbosacral radiculopathy at L4, Lumbar facet syndrome (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), Chronic lumbar radicular pain (L4 & S1 Dermatome) (Left), Chronic low back pain (Primary Area of Pain) (Left), and Chronic pain syndrome (significant psychosocial component) were also pertinent to this visit.  Status Diagnosis  Persistent Persistent Controlled 1. Chronic lower extremity pain (Secondary Area of Pain) (Left)   2. Lumbosacral radiculopathy at L4   3. Lumbar facet syndrome (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)   4. Chronic lumbar radicular pain (L4 & S1 Dermatome) (Left)   5. Chronic low back pain (Primary Area of Pain) (Left)   6. Chronic pain syndrome (significant psychosocial component)     Problems updated and reviewed during this visit: Problem  Chronic hip pain (Tertiary Area of Pain) (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)  Chronic low back pain (Primary Area of Pain) (Left)  Chronic lower extremity pain (Secondary Area of Pain) (Left)  Decreased Motor Strength   Overview:  Recent weakness and feeling "off balance" has improved over night. Reports from [facility] did not show any evidence of stroke or MI. Suspect TIA and will treat with Aggrenox. Also schedule neurology referral.   Other Long Term (Current) Drug Therapy  Disorder of Bone, Unspecified  Elevated Brain Natriuretic Peptide (Bnp) Level  Coagulation Disorder (Hcc)  Copd (Chronic Obstructive Pulmonary Disease) (Hcc)  Copd With Hypoxia (Hcc)  Grief At Loss of Child  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[place])  Chronic diastolic heart failure (HCC)  Chest Pain  Chronic Rhinitis   Overview:  Recent flare. Suspect allergic rhinitis. Use antihistamine (OTC) and add Qnasl.   CAFL (chronic airflow limitation) (HCC)  Essential (Primary) Hypertension  Acute Postoperative Pain (Resolved)   Plan of Care  Pharmacotherapy (Medications Ordered): No orders of the defined types were placed in  this encounter.  Medications administered today: [name] had no medications administered during this visit.   Procedure Orders     Lumbar Epidural Injection     Lumbar Transforaminal Epidural Lab Orders  No laboratory test(s) ordered today   Imaging Orders  No imaging studies ordered today   Referral Orders  No referral(s) requested today    Interventional management options: Planned, scheduled, and/or pending:   Diagnostic/therapeutic left-sided L4-5 interlaminar lumbar epidural steroid injection + left-sided L4 transforaminal epidural steroid injection, under fluoroscopic guidance and IV sedation (as needed)   Considering:   Diagnostic bilateral lumbar facet block Possible bilateral lumbar facet RFA(right side done on 10/03/2017: Left side done on 06/27/2017)   Palliative PRN treatment(s):   Palliative left sided lumbar facetblock    Provider-requested follow-up: Return for PRN Procedure (with sedation): (L) L4-5 TFESI + LESI.  Future Appointments  Date Time Provider [place]  02/01/2018  2:30 PM Vevelyn Francois, NP [facility] None  03/14/2018  1:20 PM [name], FNP [facility] None  04/10/2018  3:00 PM Harlin Heys, MD [facility] None   Primary Care Physician: Margo Common, PA Location: [facility] Outpatient Pain Management Facility Note by: Gaspar Cola, MD Date: 11/27/2017; Time: 2:27 PM

## 2017-11-27 NOTE — Progress Notes (Signed)
Safety precautions to be maintained throughout the outpatient stay will include: orient to surroundings, keep bed in low position, maintain call bell within reach at all times, provide assistance with transfer out of bed and ambulation.   Patient recently treated for flu/bronchitis.  Currently on steroid taper and levaquin.

## 2017-12-01 DIAGNOSIS — J9610 Chronic respiratory failure, unspecified whether with hypoxia or hypercapnia: Secondary | ICD-10-CM | POA: Diagnosis not present

## 2017-12-04 ENCOUNTER — Ambulatory Visit (INDEPENDENT_AMBULATORY_CARE_PROVIDER_SITE_OTHER): Payer: Medicare HMO | Admitting: Family Medicine

## 2017-12-04 ENCOUNTER — Encounter: Payer: Self-pay | Admitting: Family Medicine

## 2017-12-04 VITALS — BP 118/66 | HR 76 | Temp 98.6°F | Wt 238.0 lb

## 2017-12-04 DIAGNOSIS — K140 Glossitis: Secondary | ICD-10-CM | POA: Diagnosis not present

## 2017-12-04 DIAGNOSIS — R0902 Hypoxemia: Secondary | ICD-10-CM

## 2017-12-04 DIAGNOSIS — J449 Chronic obstructive pulmonary disease, unspecified: Secondary | ICD-10-CM

## 2017-12-04 DIAGNOSIS — J9610 Chronic respiratory failure, unspecified whether with hypoxia or hypercapnia: Secondary | ICD-10-CM | POA: Diagnosis not present

## 2017-12-04 MED ORDER — CLOTRIMAZOLE 10 MG MT LOZG: 10.0000 mg | LOZENGE | Freq: Four times a day (QID) | OROMUCOSAL | 0 refills | Status: DC

## 2017-12-04 NOTE — Progress Notes (Signed)
Patient: Theresa [name] Female    DOB: Feb 26, 1956   62 y.o.   MRN: [id number] Visit Date: 12/04/2017  Today's Provider: [name], PA   Chief Complaint  Patient presents with  . URI   Subjective:    HPI COPD with acute exacerbation:  Patient presents for a follow up. Last OV was on 11/23/17. Patient advised to  use Mucinex-DM, Levaquin 500 mg and 12 day prednisone taper. She was also advised to increase fluid intake and recheck if no better in 2 weeks.  She was previously seen on 11/15/17. CXR confirmed COPD but no pneumonia. Patient was advised to start prednisone, doxycycline, regular duonebs and continue controller Advair inhaler. Patient reports good compliance with treatment plans. She completed all the medications prescribed at both visits.  She states symptoms are improved, but this morning she woke you with a sore throat and rhinorrhea. She also states she was short of breathe this morning even with a CPAP and O2.   Past Medical History:  Diagnosis Date  . Acute postoperative pain 10/03/2017  . Anxiety   . Carpal tunnel syndrome   . CHF (congestive heart failure) ([place])   . CHF (congestive heart failure) ([place])    1/18  . Chronic generalized abdominal pain   . Chronic rhinitis   . COPD (chronic obstructive pulmonary disease) ([place])   . DDD (degenerative disc disease), cervical   . Depression   . Edema   . Flu    1/18  . Gastritis   . Hematuria   . Hypertension   . Kidney stones   . Low back pain   . Lumbar radiculitis   . Malodorous urine   . Muscle weakness   . Obesity   . Renal cyst   . Sensory urge incontinence   . Tobacco abuse    Past Surgical History:  Procedure Laterality Date  . ABDOMINAL HYSTERECTOMY    . CARPAL TUNNEL RELEASE Left   . TONSILLECTOMY    . TUBAL LIGATION     Family History  Problem Relation Age of Onset  . Heart disease Mother   . Stroke Mother   . Coronary artery disease Mother   . Lung cancer Sister   . Alcohol  abuse Father   . Heart disease Father   . Prostate cancer Neg Hx   . Kidney cancer Neg Hx   . Bladder Cancer Neg Hx    Allergies  Allergen Reactions  . Codeine Nausea And Vomiting  . Meloxicam Other (See Comments)    Stomach pain    Current Outpatient Medications:  .  albuterol (PROAIR HFA) 108 (90 Base) MCG/ACT inhaler, INHALE 2 PUFFS BY MOUTH EVERY 6 HOURS AS NEEDED FOR WHEEZING, Disp: , Rfl:  .  ALPRAZolam (XANAX) 0.5 MG tablet, Take 1 tablet (0.5 mg total) by mouth 2 (two) times daily as needed for anxiety. (Patient taking differently: Take 0.25 mg by mouth 2 (two) times daily as needed for anxiety. ), Disp: 60 tablet, Rfl: 0 .  Aspirin-Caffeine (BC FAST PAIN RELIEF ARTHRITIS) 1000-65 MG PACK, Take 1 packet by mouth every 6 (six) hours as needed (Pain)., Disp: , Rfl:  .  bisoprolol-hydrochlorothiazide (ZIAC) 10-6.25 MG tablet, TAKE 1 TABLET BY MOUTH EVERY DAY, Disp: 90 tablet, Rfl: 0 .  Cholecalciferol ([employer] D3) 2000 units CAPS, Take 1 capsule by mouth daily., Disp: , Rfl:  .  Fluticasone-Salmeterol (ADVAIR DISKUS) 250-50 MCG/DOSE AEPB, INHALE 1 PUFF INTO LUNGS TWICE A DAY, Disp: ,  Rfl:  .  furosemide (LASIX) 40 MG tablet, Take 1 tablet (40 mg total) by mouth 2 (two) times daily., Disp: 60 tablet, Rfl: 3 .  gabapentin (NEURONTIN) 100 MG capsule, Take 1-3 capsules (100-300 mg total) by mouth at bedtime. Follow written titration schedule., Disp: 90 capsule, Rfl: 2 .  HYDROcodone-acetaminophen (NORCO/VICODIN) 5-325 MG tablet, Take 1 tablet by mouth every 8 (eight) hours as needed for severe pain., Disp: 90 tablet, Rfl: 0 .  ipratropium-albuterol (DUONEB) 0.5-2.5 (3) MG/3ML SOLN, Take 3 mLs by nebulization every 6 (six) hours as needed (shortness of breath)., Disp: 360 mL, Rfl: 3 .  ketoconazole (NIZORAL) 2 % cream, Apply 1 application topically daily., Disp: , Rfl:  .  OXYGEN, Inhale 2.5 L into the lungs continuous. Reported on 11/03/2015, Disp: , Rfl:  .  potassium chloride SA  (K-DUR,KLOR-CON) 20 MEQ tablet, Take 1 tablet (20 mEq total) by mouth 2 (two) times daily., Disp: 60 tablet, Rfl: 3 .  ranitidine (ZANTAC) 150 MG tablet, Take 150 mg by mouth 2 (two) times daily., Disp: , Rfl:  .  sertraline (ZOLOFT) 100 MG tablet, TAKE 1.5 TABLET (150 MG TOTAL) BY MOUTH DAILY., Disp: 135 tablet, Rfl: 4  Review of Systems  Constitutional: Negative.   HENT: Positive for congestion and sore throat.   Respiratory: Positive for cough and shortness of breath.   Cardiovascular: Negative.    Social History   Tobacco Use  . Smoking status: Current Every Day Smoker    Packs/day: 1.00    Years: 40.00    Pack years: 40.00    Types: Cigarettes  . Smokeless tobacco: Never Used  . Tobacco comment: first day of patch patient states she's trying  Substance Use Topics  . Alcohol use: No    Alcohol/week: 0.0 oz   Objective:   BP 118/66 (BP Location: Right Arm, Patient Position: Sitting, Cuff Size: Normal)   Pulse 76   Temp 98.6 F (37 C) (Oral)   Wt 238 lb (108 kg)   SpO2 97%   BMI 42.16 kg/m  Wt Readings from Last 3 Encounters:  12/04/17 238 lb (108 kg)  11/27/17 238 lb (108 kg)  11/15/17 243 lb (110.2 kg)    Physical Exam  Constitutional: She is oriented to person, place, and time. She appears well-developed and well-nourished. No distress.  HENT:  Head: Normocephalic and atraumatic.  Right Ear: Hearing normal.  Left Ear: Hearing normal.  Nose: Nose normal.  Tongue with white coating and some soreness.  Eyes: Conjunctivae and lids are normal. Right eye exhibits no discharge. Left eye exhibits no discharge. No scleral icterus.  Neck: Neck supple.  Cardiovascular: Normal rate and regular rhythm.  Pulmonary/Chest: Effort normal. No respiratory distress.  Very distant breath sounds. No wheezes, rales or rhonchi.  Musculoskeletal: Normal range of motion.  Neurological: She is alert and oriented to person, place, and time.  Skin: Skin is intact. No lesion and no rash  noted.  Psychiatric: She has a normal mood and affect. Her speech is normal and behavior is normal. Thought content normal.      Assessment & Plan:     1. COPD with hypoxia ([place]) Less cough and breathing better since she finished the antibiotic and prednisone. Still on oxygen at 2.5 LPM by nasal cannula. Having some sore throat and sore tongue sensation. Will treat for suspected monilial glossitis. Continue present inhaled medications and oxygen. Recheck prn.  2. Glossitis Onset over the past week. No fever or significant PND or  rhinorrhea. Increase use of Biotene for dry mouth symptoms. Will treat coated tongue with Mycelex Troche if no better after using the Biotene for dry mouth symptoms. Recheck prn. - clotrimazole (MYCELEX) 10 MG troche; Take 1 lozenge (10 mg total) by mouth 4 (four) times daily.  Dispense: 24 lozenge; Refill: [place], PA  [facility]

## 2017-12-05 IMAGING — MG MM DIGITAL SCREENING BILAT W/ CAD
4 series · 4 of 4 positions shown · non-contrast
Comparison: Previous exam(s).

CLINICAL DATA: Screening.

EXAM:
DIGITAL SCREENING BILATERAL MAMMOGRAM WITH CAD

[L CC]
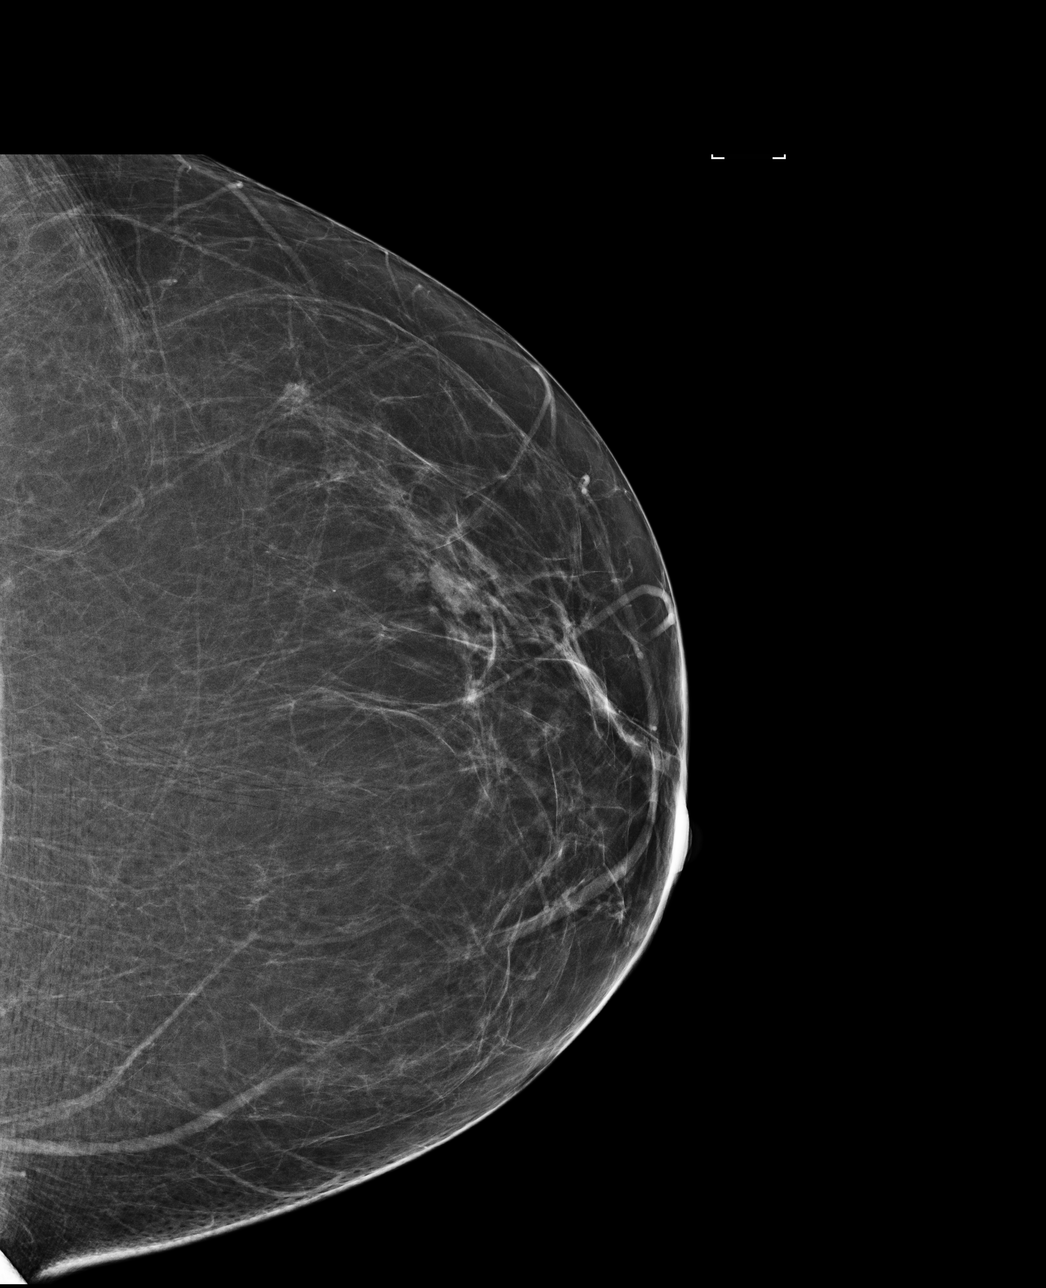

[L MLO]
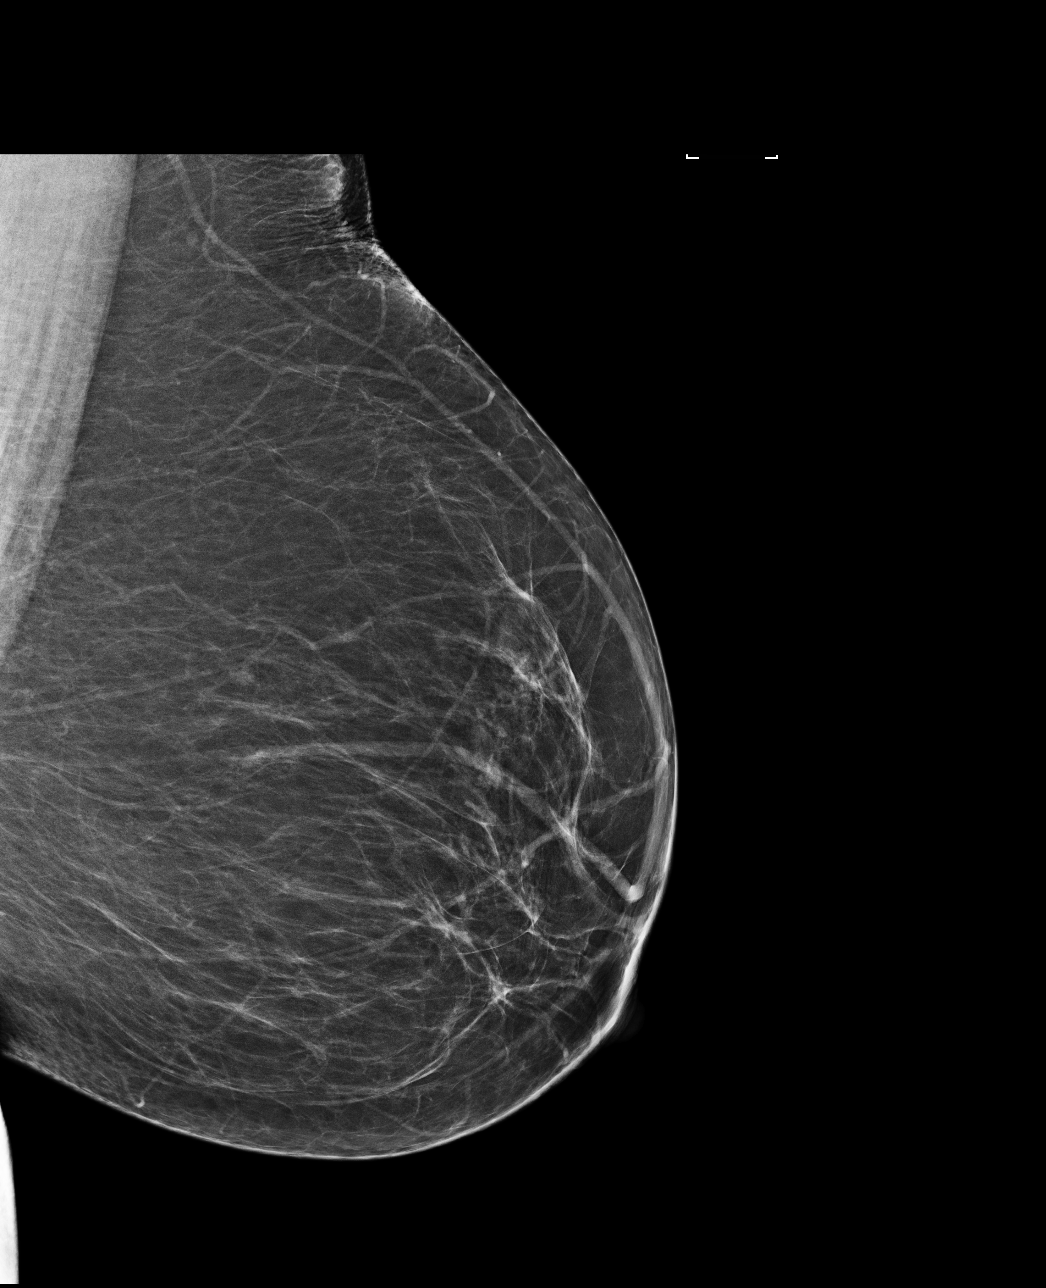

[R MLO]
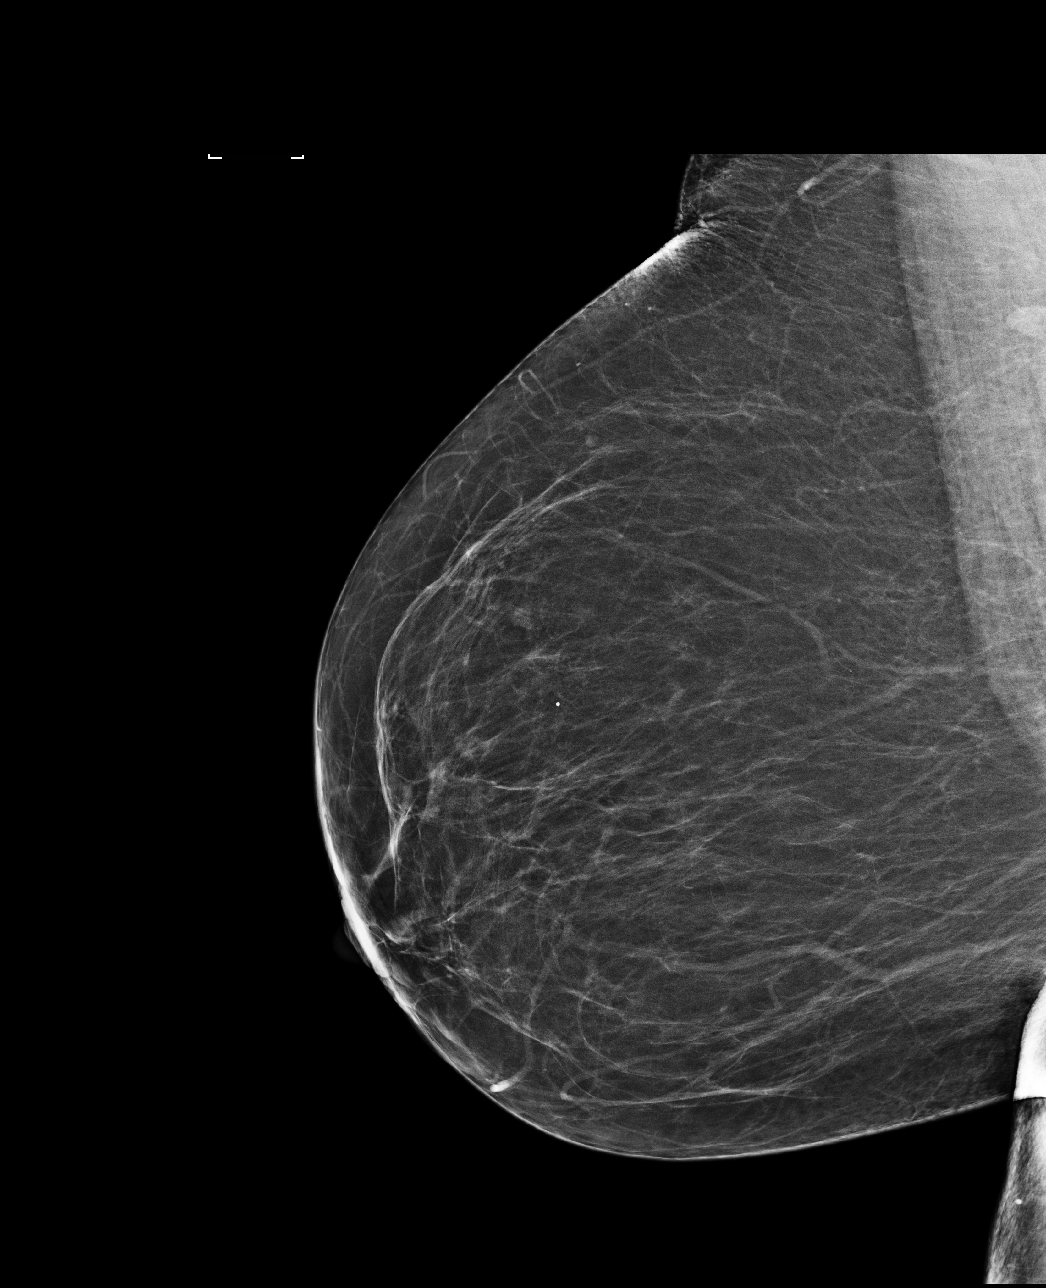

[R CC]
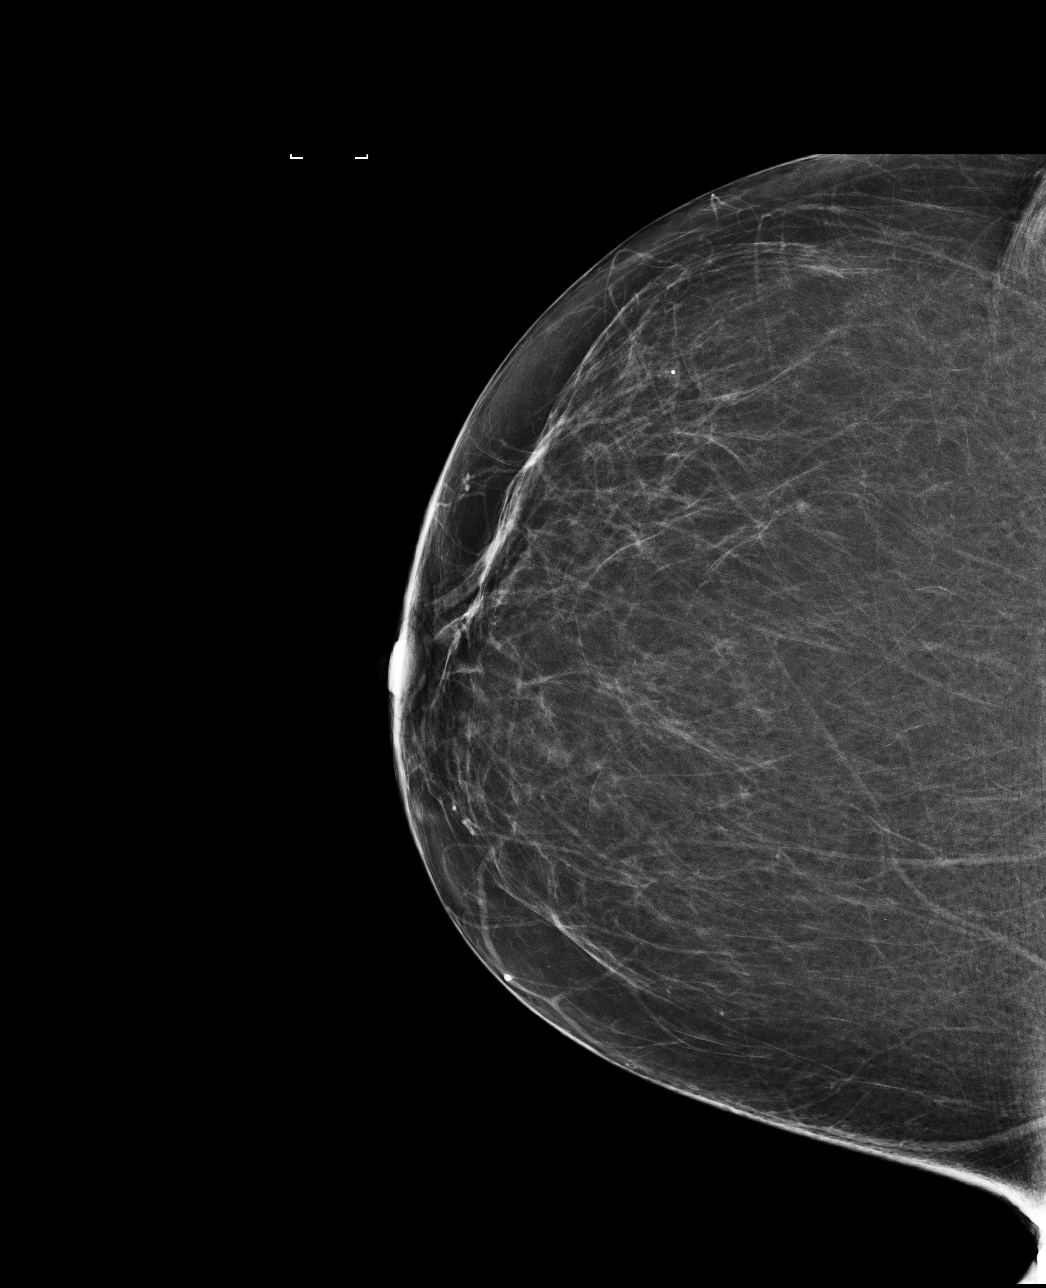

[4 of 4 positions shown; findings below may reference images not displayed]

ACR Breast Density Category b: There are scattered areas of
fibroglandular density.
FINDINGS: In the left breast, a possible mass warrants further evaluation. In
the right breast, no findings suspicious for malignancy.

Images were processed with CAD.
IMPRESSION: Further evaluation is suggested for possible mass in the left
breast.

RECOMMENDATION:
Diagnostic mammogram and possibly ultrasound of the left breast.
(Code:5M-3-PPT)

The patient will be contacted regarding the findings, and additional
imaging will be scheduled.

BI-RADS CATEGORY  0: Incomplete. Need additional imaging evaluation
and/or prior mammograms for comparison.

## 2017-12-09 ENCOUNTER — Inpatient Hospital Stay (HOSPITAL_COMMUNITY)
Admission: EM | Admit: 2017-12-09 | Discharge: 2017-12-11 | DRG: 208 | Discharge status: Against Medical Advice | Payer: Medicare HMO | Attending: Critical Care Medicine | Admitting: Critical Care Medicine

## 2017-12-09 ENCOUNTER — Emergency Department (HOSPITAL_COMMUNITY): Payer: Medicare HMO

## 2017-12-09 ENCOUNTER — Encounter (HOSPITAL_COMMUNITY): Payer: Self-pay

## 2017-12-09 DIAGNOSIS — R4182 Altered mental status, unspecified: Secondary | ICD-10-CM | POA: Diagnosis not present

## 2017-12-09 DIAGNOSIS — F329 Major depressive disorder, single episode, unspecified: Secondary | ICD-10-CM | POA: Diagnosis present

## 2017-12-09 DIAGNOSIS — J9601 Acute respiratory failure with hypoxia: Secondary | ICD-10-CM

## 2017-12-09 DIAGNOSIS — Z452 Encounter for adjustment and management of vascular access device: Secondary | ICD-10-CM | POA: Diagnosis not present

## 2017-12-09 DIAGNOSIS — G9340 Encephalopathy, unspecified: Secondary | ICD-10-CM | POA: Diagnosis not present

## 2017-12-09 DIAGNOSIS — R29810 Facial weakness: Secondary | ICD-10-CM | POA: Diagnosis present

## 2017-12-09 DIAGNOSIS — G931 Anoxic brain damage, not elsewhere classified: Secondary | ICD-10-CM | POA: Diagnosis present

## 2017-12-09 DIAGNOSIS — K219 Gastro-esophageal reflux disease without esophagitis: Secondary | ICD-10-CM | POA: Diagnosis present

## 2017-12-09 DIAGNOSIS — J9621 Acute and chronic respiratory failure with hypoxia: Secondary | ICD-10-CM

## 2017-12-09 DIAGNOSIS — R531 Weakness: Secondary | ICD-10-CM | POA: Diagnosis not present

## 2017-12-09 DIAGNOSIS — T4274XA Poisoning by unspecified antiepileptic and sedative-hypnotic drugs, undetermined, initial encounter: Secondary | ICD-10-CM | POA: Diagnosis present

## 2017-12-09 DIAGNOSIS — R402432 Glasgow coma scale score 3-8, at arrival to emergency department: Secondary | ICD-10-CM | POA: Diagnosis present

## 2017-12-09 DIAGNOSIS — Y92009 Unspecified place in unspecified non-institutional (private) residence as the place of occurrence of the external cause: Secondary | ICD-10-CM | POA: Diagnosis not present

## 2017-12-09 DIAGNOSIS — E875 Hyperkalemia: Secondary | ICD-10-CM | POA: Diagnosis present

## 2017-12-09 DIAGNOSIS — I5032 Chronic diastolic (congestive) heart failure: Secondary | ICD-10-CM | POA: Diagnosis present

## 2017-12-09 DIAGNOSIS — X58XXXA Exposure to other specified factors, initial encounter: Secondary | ICD-10-CM | POA: Diagnosis present

## 2017-12-09 DIAGNOSIS — Z6841 Body Mass Index (BMI) 40.0 and over, adult: Secondary | ICD-10-CM | POA: Diagnosis not present

## 2017-12-09 DIAGNOSIS — I6523 Occlusion and stenosis of bilateral carotid arteries: Secondary | ICD-10-CM | POA: Diagnosis not present

## 2017-12-09 DIAGNOSIS — Z4682 Encounter for fitting and adjustment of non-vascular catheter: Secondary | ICD-10-CM | POA: Diagnosis not present

## 2017-12-09 DIAGNOSIS — I110 Hypertensive heart disease with heart failure: Secondary | ICD-10-CM | POA: Diagnosis present

## 2017-12-09 DIAGNOSIS — Z886 Allergy status to analgesic agent status: Secondary | ICD-10-CM

## 2017-12-09 DIAGNOSIS — G8929 Other chronic pain: Secondary | ICD-10-CM | POA: Diagnosis present

## 2017-12-09 DIAGNOSIS — G8194 Hemiplegia, unspecified affecting left nondominant side: Secondary | ICD-10-CM | POA: Diagnosis present

## 2017-12-09 DIAGNOSIS — Z9981 Dependence on supplemental oxygen: Secondary | ICD-10-CM | POA: Diagnosis not present

## 2017-12-09 DIAGNOSIS — G4733 Obstructive sleep apnea (adult) (pediatric): Secondary | ICD-10-CM | POA: Diagnosis present

## 2017-12-09 DIAGNOSIS — Z9119 Patient's noncompliance with other medical treatment and regimen: Secondary | ICD-10-CM

## 2017-12-09 DIAGNOSIS — R739 Hyperglycemia, unspecified: Secondary | ICD-10-CM | POA: Diagnosis not present

## 2017-12-09 DIAGNOSIS — J9622 Acute and chronic respiratory failure with hypercapnia: Secondary | ICD-10-CM | POA: Diagnosis not present

## 2017-12-09 DIAGNOSIS — F419 Anxiety disorder, unspecified: Secondary | ICD-10-CM | POA: Diagnosis present

## 2017-12-09 DIAGNOSIS — J441 Chronic obstructive pulmonary disease with (acute) exacerbation: Secondary | ICD-10-CM | POA: Diagnosis present

## 2017-12-09 DIAGNOSIS — Z5321 Procedure and treatment not carried out due to patient leaving prior to being seen by health care provider: Secondary | ICD-10-CM | POA: Diagnosis not present

## 2017-12-09 DIAGNOSIS — R0902 Hypoxemia: Secondary | ICD-10-CM | POA: Diagnosis not present

## 2017-12-09 DIAGNOSIS — E872 Acidosis: Secondary | ICD-10-CM | POA: Diagnosis present

## 2017-12-09 DIAGNOSIS — J690 Pneumonitis due to inhalation of food and vomit: Secondary | ICD-10-CM | POA: Diagnosis not present

## 2017-12-09 DIAGNOSIS — N179 Acute kidney failure, unspecified: Secondary | ICD-10-CM | POA: Diagnosis not present

## 2017-12-09 DIAGNOSIS — R29818 Other symptoms and signs involving the nervous system: Secondary | ICD-10-CM | POA: Diagnosis not present

## 2017-12-09 DIAGNOSIS — I639 Cerebral infarction, unspecified: Secondary | ICD-10-CM

## 2017-12-09 DIAGNOSIS — Z79899 Other long term (current) drug therapy: Secondary | ICD-10-CM

## 2017-12-09 DIAGNOSIS — Z7951 Long term (current) use of inhaled steroids: Secondary | ICD-10-CM

## 2017-12-09 DIAGNOSIS — J310 Chronic rhinitis: Secondary | ICD-10-CM | POA: Diagnosis present

## 2017-12-09 DIAGNOSIS — R0602 Shortness of breath: Secondary | ICD-10-CM | POA: Diagnosis not present

## 2017-12-09 DIAGNOSIS — I10 Essential (primary) hypertension: Secondary | ICD-10-CM | POA: Diagnosis not present

## 2017-12-09 DIAGNOSIS — M545 Low back pain: Secondary | ICD-10-CM | POA: Diagnosis present

## 2017-12-09 DIAGNOSIS — F17200 Nicotine dependence, unspecified, uncomplicated: Secondary | ICD-10-CM | POA: Diagnosis present

## 2017-12-09 DIAGNOSIS — J9602 Acute respiratory failure with hypercapnia: Secondary | ICD-10-CM | POA: Diagnosis not present

## 2017-12-09 DIAGNOSIS — J9600 Acute respiratory failure, unspecified whether with hypoxia or hypercapnia: Secondary | ICD-10-CM | POA: Diagnosis present

## 2017-12-09 DIAGNOSIS — Z885 Allergy status to narcotic agent status: Secondary | ICD-10-CM

## 2017-12-09 DIAGNOSIS — Z01818 Encounter for other preprocedural examination: Secondary | ICD-10-CM

## 2017-12-09 DIAGNOSIS — Z0189 Encounter for other specified special examinations: Secondary | ICD-10-CM

## 2017-12-09 DIAGNOSIS — I161 Hypertensive emergency: Secondary | ICD-10-CM | POA: Diagnosis not present

## 2017-12-09 LAB — I-STAT ARTERIAL BLOOD GAS, ED
Bicarbonate: 32.1 mmol/L — ABNORMAL HIGH (ref 20.0–28.0)
O2 Saturation: 91 %
Patient temperature: 37
TCO2: 35 mmol/L — ABNORMAL HIGH (ref 22–32)
pCO2 arterial: 88.1 mmHg (ref 32.0–48.0)
pH, Arterial: 7.17 — CL (ref 7.350–7.450)
pO2, Arterial: 82 mmHg — ABNORMAL LOW (ref 83.0–108.0)

## 2017-12-09 LAB — URINALYSIS, ROUTINE W REFLEX MICROSCOPIC
Bilirubin Urine: NEGATIVE
Glucose, UA: NEGATIVE mg/dL
Hgb urine dipstick: NEGATIVE
Ketones, ur: NEGATIVE mg/dL
Leukocytes, UA: NEGATIVE
Nitrite: NEGATIVE
Protein, ur: NEGATIVE mg/dL
Specific Gravity, Urine: 1.028 (ref 1.005–1.030)
pH: 5 (ref 5.0–8.0)

## 2017-12-09 LAB — COMPREHENSIVE METABOLIC PANEL
ALT: 21 U/L (ref 14–54)
AST: 24 U/L (ref 15–41)
Albumin: 3.8 g/dL (ref 3.5–5.0)
Alkaline Phosphatase: 87 U/L (ref 38–126)
Anion gap: 13 (ref 5–15)
BUN: 31 mg/dL — ABNORMAL HIGH (ref 6–20)
CO2: 23 mmol/L (ref 22–32)
Calcium: 8.7 mg/dL — ABNORMAL LOW (ref 8.9–10.3)
Chloride: 103 mmol/L (ref 101–111)
Creatinine, Ser: 1.35 mg/dL — ABNORMAL HIGH (ref 0.44–1.00)
GFR calc Af Amer: 48 mL/min — ABNORMAL LOW (ref >60)
GFR calc non Af Amer: 42 mL/min — ABNORMAL LOW (ref >60)
Glucose, Bld: 140 mg/dL — ABNORMAL HIGH (ref 65–99)
Potassium: 5.9 mmol/L — ABNORMAL HIGH (ref 3.5–5.1)
Sodium: 139 mmol/L (ref 135–145)
Total Bilirubin: 0.4 mg/dL (ref 0.3–1.2)
Total Protein: 6.7 g/dL (ref 6.5–8.1)

## 2017-12-09 LAB — PROTIME-INR
INR: 1.04
Prothrombin Time: 13.6 seconds (ref 11.4–15.2)

## 2017-12-09 LAB — DIFFERENTIAL
Basophils Absolute: 0 10*3/uL (ref 0.0–0.1)
Basophils Relative: 0 %
Eosinophils Absolute: 0 10*3/uL (ref 0.0–0.7)
Eosinophils Relative: 0 %
Lymphocytes Relative: 11 %
Lymphs Abs: 1.8 10*3/uL (ref 0.7–4.0)
Monocytes Absolute: 0.6 10*3/uL (ref 0.1–1.0)
Monocytes Relative: 4 %
Neutro Abs: 14.4 10*3/uL — ABNORMAL HIGH (ref 1.7–7.7)
Neutrophils Relative %: 85 %

## 2017-12-09 LAB — I-STAT CHEM 8, ED
BUN: 34 mg/dL — ABNORMAL HIGH (ref 6–20)
Calcium, Ion: 0.98 mmol/L — ABNORMAL LOW (ref 1.15–1.40)
Chloride: 106 mmol/L (ref 101–111)
Creatinine, Ser: 1.4 mg/dL — ABNORMAL HIGH (ref 0.44–1.00)
Glucose, Bld: 137 mg/dL — ABNORMAL HIGH (ref 65–99)
HCT: 48 % — ABNORMAL HIGH (ref 36.0–46.0)
Hemoglobin: 16.3 g/dL — ABNORMAL HIGH (ref 12.0–15.0)
Potassium: 5.6 mmol/L — ABNORMAL HIGH (ref 3.5–5.1)
Sodium: 140 mmol/L (ref 135–145)
TCO2: 30 mmol/L (ref 22–32)

## 2017-12-09 LAB — I-STAT TROPONIN, ED: Troponin i, poc: 0 ng/mL (ref 0.00–0.08)

## 2017-12-09 LAB — CBC
HCT: 49.6 % — ABNORMAL HIGH (ref 36.0–46.0)
Hemoglobin: 15.1 g/dL — ABNORMAL HIGH (ref 12.0–15.0)
MCH: 33 pg (ref 26.0–34.0)
MCHC: 30.4 g/dL (ref 30.0–36.0)
MCV: 108.3 fL — ABNORMAL HIGH (ref 78.0–100.0)
Platelets: 191 10*3/uL (ref 150–400)
RBC: 4.58 MIL/uL (ref 3.87–5.11)
RDW: 15.8 % — ABNORMAL HIGH (ref 11.5–15.5)
WBC: 16.9 10*3/uL — ABNORMAL HIGH (ref 4.0–10.5)

## 2017-12-09 LAB — APTT: aPTT: 36 seconds (ref 24–36)

## 2017-12-09 LAB — AMMONIA: Ammonia: 31 umol/L (ref 9–35)

## 2017-12-09 LAB — CBG MONITORING, ED: Glucose-Capillary: 130 mg/dL — ABNORMAL HIGH (ref 65–99)

## 2017-12-09 MED ORDER — NALOXONE HCL 0.4 MG/ML IJ SOLN
INTRAMUSCULAR | Status: AC
  Administered 2017-12-10: 02:00:00
  Filled 2017-12-09: qty 1

## 2017-12-09 MED ORDER — IPRATROPIUM-ALBUTEROL 0.5-2.5 (3) MG/3ML IN SOLN
3.0000 mL | Freq: Four times a day (QID) | RESPIRATORY_TRACT | Status: DC
  Administered 2017-12-10 – 2017-12-11 (×6): 3 mL via RESPIRATORY_TRACT
  Filled 2017-12-09 (×6): qty 3

## 2017-12-09 MED ORDER — ARFORMOTEROL TARTRATE 15 MCG/2ML IN NEBU
15.0000 ug | INHALATION_SOLUTION | Freq: Two times a day (BID) | RESPIRATORY_TRACT | Status: DC
  Administered 2017-12-10 – 2017-12-11 (×3): 15 ug via RESPIRATORY_TRACT
  Filled 2017-12-09 (×3): qty 2

## 2017-12-09 MED ORDER — SODIUM CHLORIDE 0.9 % IV SOLN
250.0000 mL | INTRAVENOUS | Status: DC | PRN
  Administered 2017-12-10: 250 mL via INTRAVENOUS

## 2017-12-09 MED ORDER — METHYLPREDNISOLONE SODIUM SUCC 125 MG IJ SOLR
60.0000 mg | Freq: Four times a day (QID) | INTRAMUSCULAR | Status: DC
  Administered 2017-12-10 – 2017-12-11 (×6): 60 mg via INTRAVENOUS
  Filled 2017-12-09: qty 0.96
  Filled 2017-12-09: qty 2
  Filled 2017-12-09: qty 0.96
  Filled 2017-12-09: qty 2
  Filled 2017-12-09 (×3): qty 0.96

## 2017-12-09 MED ORDER — VANCOMYCIN HCL 10 G IV SOLR
2000.0000 mg | Freq: Once | INTRAVENOUS | Status: AC
  Administered 2017-12-10: 2000 mg via INTRAVENOUS
  Filled 2017-12-09: qty 2000

## 2017-12-09 MED ORDER — HEPARIN SODIUM (PORCINE) 5000 UNIT/ML IJ SOLN
5000.0000 [IU] | Freq: Three times a day (TID) | INTRAMUSCULAR | Status: DC
  Administered 2017-12-10 – 2017-12-11 (×5): 5000 [IU] via SUBCUTANEOUS
  Filled 2017-12-09 (×6): qty 1

## 2017-12-09 MED ORDER — BUDESONIDE 0.5 MG/2ML IN SUSP
0.5000 mg | Freq: Two times a day (BID) | RESPIRATORY_TRACT | Status: DC
  Administered 2017-12-10 – 2017-12-11 (×3): 0.5 mg via RESPIRATORY_TRACT
  Filled 2017-12-09 (×3): qty 2

## 2017-12-09 MED ORDER — PIPERACILLIN-TAZOBACTAM 3.375 G IVPB 30 MIN
3.3750 g | Freq: Once | INTRAVENOUS | Status: AC
  Administered 2017-12-10: 3.375 g via INTRAVENOUS
  Filled 2017-12-09: qty 50

## 2017-12-09 MED ORDER — IOPAMIDOL (ISOVUE-370) INJECTION 76%
100.0000 mL | Freq: Once | INTRAVENOUS | Status: AC | PRN
  Administered 2017-12-09: 100 mL via INTRAVENOUS

## 2017-12-09 MED ORDER — VANCOMYCIN HCL 10 G IV SOLR: 1250.0000 mg | INTRAVENOUS | Status: DC

## 2017-12-09 MED ORDER — PIPERACILLIN-TAZOBACTAM 3.375 G IVPB
3.3750 g | Freq: Three times a day (TID) | INTRAVENOUS | Status: DC
  Administered 2017-12-10 – 2017-12-11 (×4): 3.375 g via INTRAVENOUS
  Filled 2017-12-09 (×5): qty 50

## 2017-12-09 NOTE — ED Notes (Signed)
Called code stroke with care link on pt upon arrival to ER, 19:40

## 2017-12-09 NOTE — ED Triage Notes (Signed)
Pt arrived via [facility] from home c/o left sided weakness, LKN 1500 prior to nap.  Pt found with home O2 off home SPO2 in the 60's per family. EMS gave Narcan x1 dose

## 2017-12-09 NOTE — H&P (Addendum)
[facility] Admission History and Physical Service Pager: [phone]  Patient name: Theresa [name] Medical record number: [id number] Date of birth: 20-May-1956 Age: 62 y.o. Gender: female  Primary Care Provider: Patient, No Pcp Per Consultants: Neurology Code Status: Full  Chief Complaint:   Altered Mental Status  Assessment and Plan: Theresa [name] is a 62 y.o. female presenting with altered mental status with concern for stroke. PMH is significant for COPD, HTN, Chronic Back Pain, GERD, Depression  Altered Mental Status: Acute. Worsened. Working etiology is intoxication, versus transient global ischemia post stroke, versus hypercarbia induced AMS, versus infectious etiology. CXR showed pulmonary vascular edema and borderline cardiomegaly. EKG showed normal sinus rhythm, possible old anteroseptal infarct. Minimal ST depression in inferior leads, but overall unchanged from prior. CT of head showed no acute intracranial process. U/A is normal. Ammonia is normal. UDS pending. ABG is pending. History is limited due to extreme somnolence of patient. She is on home oxygen 2.5L and requiring 6L in ED. She was given Narcan by EMS. Very difficult to determine cause of her current AMS. Husband endorses she often "goes to sleep and won't wake up". Most likely etiology is AMS by overuse of sedating medications given pinpoint pupils but stroke cannot be ruled out at this time. Her GCS score when we initially evaluated was 3 (no eye opening to painful stimuli, no movement to pain, no verbal response). Upon re-evaluation with the ED physician, GCS was about 8 but her responses were inconsistent). Recommended CCM consult to evaluate patient due to concern that she may not be able to protect her airway in the very near future.  - admit to [facility], attending Dr. Erin [name] - Vitals per floor with continuous pulse ox - VBG ordered in the ED is in process. Ordered ABG STAT which resulted  after we evaluated patient. She was found to have acute hypoxic hypercarbic respiratory acidosis >> CCM to take over care.   - Neurology is following; appreciate recs: MRI Brain  - UDS pending - MRI to rule out stroke - Risk stratification Labs: Lipid panel, TSH, HgbA1c - PT/OT and Speech  Acute Hypoxic Hypercarbic Respiratory Failure: ABG with pH 7.1, pCO2 88, O2 82.  - CCM to take over care.   HFpEF: Chronic. Has listed history of CHF but last Echo was 2016 and showed normal EF but no comment on diastolic function. Unclear if she actually has been diagnosed with CHF or not. CXR with some pulmonary congestion.  - Holding Bisoprolol due to AMS - consider IV lasix (due to NPO) but she has AKI as well.   HTN: Chronic. SBP ranging from 94-143 and DBP ranging from 71-94 since admission. Home meds are Lasix 33m BID, Bisoprolol 117monce daily, HCTZ 6.2569mnce daily - Hold home meds until mental status improves - Permissive HTN for now for possible stroke, awaiting MRI  COPD: Chronic. Patient takes multiple home meds and uses duonebs at home as needed. On Advair, albuterol, at home, Uses BiPaP at night per husband. On 2.5L O2 at home. Currently on 6L [place] - ABG pending - Dounebs q6 scheduled - Hold home meds until mental status improves  Chronic Back Pain: Per husband she has had low back pain "for forever". She was told she was not a good surgical candidate per husband. On home Gabapentin 300m36m night. On home Norco 10/325mg17mprn. - Holding Norco 10/325mg 16mrn due to AMS - Holding Gabapention due to AMS  GERD: Chronic. On home  omeprazole 37m daily - Hold home meds until mental status improves  Anxiety/Depression: Chronic. On home sertraline 2033min the morning. Xanax 0.33m67mID prn. - Holding home meds as could be cause of her current AMS.  FEN/GI: NPO Prophylaxis: Heparin  Disposition: CCM to take over care. Will follow along and will accept to our service when transferred out  from ICU.   History of Present Illness:  SheDrisana [name] a 61 25o. female wth PMH of HTN, HFpEF, COPD, Chronic Back Pain, and Depression presenting with altered mental status. History is limited due to patient initially having GCS score of 3 when I evaluated her in the Emergency Room. At this time, she was not responsive to sternal rub. Later she was able to awaken to pain, but speech was incoherent, and she withdrawls from pain but does not localize, GSC score of 9. Per ED physician she was more alert and difficult to awaken on arrival but could stay awake with lots of prompting. History per family is that she had not slept in 2 days due to a COPD exacerbations which required her taking oral steroids. She went to a birthday party earlier today and was able to interact but would fall asleep while talking. She fell asleep at the party and did not awaken and family saw her oxygen had run out. EMS was called and she was hypoxic on their arrival. She was given narcan by EMS. Per family she takes Norco, Xanax, and gabapentin on a regular basis but had been taking them as prescribed and with no recent changes. Husband thinks she possibly took his dose of gabapentin as he takes 300m106md she takes 100mg43m ED arrival she was evaluated for code stroke and her neuro exam was significant for left sided upper and lower extremity weakness. Family reports possible facial droop but none appreciable in the ED.  ROS not obtainable due to patient not able to awaken or speak coherently.  Review Of Systems: Per HPI with the following additions: none  ROS: unable to obtain patient w/ AMS   There are no active problems to display for this patient.   Past Medical History: Past Medical History:  Diagnosis Date  . CHF (congestive heart failure) (HCC) [place] COPD (chronic obstructive pulmonary disease) (HCC) [place] Hypertension     Past Surgical History: Past Surgical History:  Procedure Laterality Date  . TUBAL  LIGATION      Social History: Social History   Tobacco Use  . Smoking status: Current Every Day Smoker  . Smokeless tobacco: Never Used  Substance Use Topics  . Alcohol use: No    Frequency: Never  . Drug use: No   Additional social history: None Please also refer to relevant sections of EMR.  Family History: History reviewed. No pertinent family history. (If not completed, MUST add something in)  Allergies and Medications: Allergies  Allergen Reactions  . Codeine Nausea And Vomiting  . Meloxicam Other (See Comments)    Stomach Pain   No current facility-administered medications on file prior to encounter.    Current Outpatient Medications on File Prior to Encounter  Medication Sig Dispense Refill  . ADVAIR DISKUS 250-50 MCG/DOSE AEPB Inhale 1 puff into the lungs 2 (two) times daily.  3  . bisoprolol-hydrochlorothiazide (ZIAC) 10-6.25 MG tablet Take 1 tablet by mouth daily.    . clotrimazole (MYCELEX) 10 MG troche Take 10 mg by mouth 4 (four) times daily. Dissolve lozenge in the mouth. DO  NOT SWALLOW WHOLE.  0  . DALIRESP 500 MCG TABS tablet Take 500 mcg by mouth daily.    . furosemide (LASIX) 40 MG tablet Take 40 mg by mouth 2 (two) times daily.    Marland Kitchen gabapentin (NEURONTIN) 100 MG capsule Take 300 mg by mouth at bedtime.     Marland Kitchen HYDROcodone-acetaminophen (NORCO/VICODIN) 5-325 MG tablet Take 1 tablet by mouth every 8 (eight) hours as needed for severe pain.  0  . ipratropium-albuterol (DUONEB) 0.5-2.5 (3) MG/3ML SOLN Inhale 3 mLs into the lungs every 6 (six) hours as needed for shortness of breath.  3  . ketoconazole (NIZORAL) 2 % cream Apply 1 application topically daily.    . potassium chloride SA (K-DUR,KLOR-CON) 20 MEQ tablet Take 20 mEq by mouth 2 (two) times daily.    . sertraline (ZOLOFT) 100 MG tablet Take 200 mg by mouth every morning.  0  . traZODone (DESYREL) 50 MG tablet Take 75 mg by mouth at bedtime as needed.  0  . VENTOLIN HFA 108 (90 Base) MCG/ACT inhaler  Inhale 2 puffs into the lungs every 6 (six) hours as needed for shortness of breath.  5  . omeprazole (PRILOSEC) 20 MG capsule Take 20 mg by mouth daily.  0    Objective: BP 128/71   Pulse 84   Temp 97.9 F (36.6 C) (Axillary)   Resp 12   Ht 5' 2" (1.575 m)   Wt 50 lb 7.8 oz (22.9 kg)   SpO2 100%   BMI 9.23 kg/m  Exam: General: extremely difficult to arouse, somnolent but responsive to pain. Eyes: pinpoint pupils Neck: obese, No JVD Cardiovascular: RRR, no murmurs, normal S1, S2, +2 distal pulses bilaterally Respiratory: increased WOB with accessory muscle use, prolonged expiration, auscultation difficult with poor and coarse lung sounds, no appreciable wheezing or crackles Gastrointestinal: obese, non-distened, non-tender, +bs MSK: unable to assess Derm: warm, dry, intact, no rashes Neuro: unable to assess due to somnolence. GCS 9. Psych: unable to assess due to somnolence  Labs and Imaging: CBC BMET  Recent Labs  Lab 12/09/17 1941 12/09/17 1954  WBC 16.9*  --   HGB 15.1* 16.3*  HCT 49.6* 48.0*  PLT 191  --    Recent Labs  Lab 12/09/17 1941 12/09/17 1954  NA 139 140  K 5.9* 5.6*  CL 103 106  CO2 23  --   BUN 31* 34*  CREATININE 1.35* 1.40*  GLUCOSE 140* 137*  CALCIUM 8.7*  --      2/22 - Ammonia: 31 2/22 - U/A: wnl 2/22 - VBG: pending 2/22 - ABG: pending 2/22 - APTT: 36 2/22 - PT/INR: 13.6/1.04  CXR Portable:  FINDINGS: UPPER limits normal heart size noted with pulmonary vascular congestion.  There is no evidence of focal airspace disease, pulmonary edema, suspicious pulmonary nodule/mass, pleural effusion, or pneumothorax.  No acute bony abnormalities are identified.  IMPRESSION: UPPER limits normal heart size with pulmonary vascular congestion.  CT Head and Neck:  IMPRESSION: 1. No acute intracranial abnormality. 2. Mild periventricular white matter hypoattenuation likely represents chronic microvascular disease. 3.  Atherosclerosis. 4. ASPECTS is 10/10  IMPRESSION: 1. No emergent large vessel occlusion. 2. Atherosclerotic disease involving the aortic arch, carotid bifurcations, and cavernous internal carotid arteries without significant stenosis at these levels. 3. Moderate narrowing of the proximal left vertebral artery at the subclavian artery. 4. Fetal type posterior cerebral arteries bilaterally with small P1 segments from the basilar tip. 5. Distal small vessel disease evident in the posterior  circulation.  Nuala Alpha, DO 12/09/2017, 11:04 PM PGY-1, [place] Intern pager: [phone], text pages welcome  UPPER LEVEL ADDENDUM  I have read the above note and made revisions highlighted in blue.  Smiley Houseman, MD PGY-2 [facility] Pager [phone]

## 2017-12-09 NOTE — ED Notes (Signed)
Paged critical care for pt

## 2017-12-09 NOTE — Consult Note (Signed)
Neurology Consultation Reason for Consult: Left-sided weakness Referring Physician: Verneda Skill  CC: Left-sided weakness  History is obtained from: EMS  HPI: Theresa [name] is a 62 y.o. female with a history of COPD requiring oxygen at baseline he went to bed at 3 PM and then was found to be increased responsiveness.  She was found to be hypoxic into the 60s due to not using her oxygen.     LKW: 3 pm tpa given?: no, out of window    ROS:  Unable to obtain due to altered mental status.    PMH:(From actual chart)  Date  Age Comment Src. PL  Tobacco abuse       Sensory urge incontinence       Renal cyst       Obesity       Muscle weakness       Malodorous urine       Lumbar radiculitis       Low back pain       Kidney stones       Hypertension       Hematuria       Gastritis       Flu   1/18    Edema       Depression       DDD (degenerative disc disease), cervical       COPD (chronic obstructive pulmonary disease) (HCC)       Chronic rhinitis       Chronic generalized abdominal pain       CHF (congestive heart failure) (HCC)       CHF (congestive heart failure) ([place])   1/18    Carpal tunnel syndrome       Anxiety       Acute postoperative pain 10/03/2017 61y         FHx: Unable to assess secondary to patient's altered mental status.    Social History:  Unable to assess secondary to patient's altered mental status.    Exam: Current vital signs:  Vital signs in last 24 hours:     Physical Exam  Constitutional: Appears well-developed and well-nourished.  Psych: Affect appropriate to situation Eyes: No scleral injection HENT: No OP obstrucion Head: Normocephalic.  Cardiovascular: Normal rate and regular rhythm.  Respiratory: Effort normal, non-labored breathing GI: Soft.  No distension. There is no tenderness.  Skin: WDI  Neuro: Mental Status: Patient is lethargic, she does not cooperate with double simultaneous extinction testing, no clear neglect  or aphasia.  She is not very cooperative with answering questions. Cranial Nerves: II: She counts fingers in foveal vision, but does not cooperate with formal field testing. Pupils are equal, round, and reactive to light.   III,IV, VI: She crosses midline in both directions, no clear gaze preference V: Facial sensation is difficult to assess due to inconsistent answers VII: Facial movement is symmetric.  VIII: hearing is intact to voice Motor: She does not give good effort in any extremity, but she does seem to have a asymmetry in her ability to hold her arms and legs out of bed with her left side collapsing more quickly. Sensory: Sensation is symmetric to light touch and temperature in the arms and legs. Cerebellar: Does not perform  I have reviewed labs in epic and the results pertinent to this consultation are: CMP-creatinine 1.35 Potassium 5.9 Leukocytosis of 16.9 Grossly elevated MCV  I have reviewed the images obtained: CT angios-no large vessel occlusion CT head-no hemorrhage  Impression: 62 year old  female with a history of COPD who was found after a hypoxic episode of unclear duration, but up to several hours.  My suspicion is that her current encephalopathy is a mild hypoxic encephalopathy, which may take some time to clear.  The asymmetry of her deficits, however does raise the possibility of ischemic stroke as well and she will need an MRI.  Recommendations: 1) MRI brain, stroke workup if positive.    Roland Rack, MD [facility] [phone]  If 7pm- 7am, please page neurology on call as listed in [place].

## 2017-12-09 NOTE — ED Notes (Signed)
Delay in lab draw,  Pt not in room at this time.

## 2017-12-10 ENCOUNTER — Inpatient Hospital Stay (HOSPITAL_COMMUNITY): Payer: Medicare HMO

## 2017-12-10 ENCOUNTER — Other Ambulatory Visit: Payer: Self-pay

## 2017-12-10 DIAGNOSIS — J441 Chronic obstructive pulmonary disease with (acute) exacerbation: Secondary | ICD-10-CM

## 2017-12-10 DIAGNOSIS — J9621 Acute and chronic respiratory failure with hypoxia: Secondary | ICD-10-CM

## 2017-12-10 DIAGNOSIS — G9340 Encephalopathy, unspecified: Secondary | ICD-10-CM

## 2017-12-10 DIAGNOSIS — J9622 Acute and chronic respiratory failure with hypercapnia: Secondary | ICD-10-CM

## 2017-12-10 DIAGNOSIS — N179 Acute kidney failure, unspecified: Secondary | ICD-10-CM

## 2017-12-10 LAB — BASIC METABOLIC PANEL
Anion gap: 12 (ref 5–15)
BUN: 26 mg/dL — ABNORMAL HIGH (ref 6–20)
CO2: 29 mmol/L (ref 22–32)
Calcium: 8.5 mg/dL — ABNORMAL LOW (ref 8.9–10.3)
Chloride: 102 mmol/L (ref 101–111)
Creatinine, Ser: 1.04 mg/dL — ABNORMAL HIGH (ref 0.44–1.00)
GFR calc Af Amer: >60 mL/min (ref >60)
GFR calc non Af Amer: 57 mL/min — ABNORMAL LOW (ref >60)
Glucose, Bld: 124 mg/dL — ABNORMAL HIGH (ref 65–99)
Potassium: 4.5 mmol/L (ref 3.5–5.1)
Sodium: 143 mmol/L (ref 135–145)

## 2017-12-10 LAB — GLUCOSE, CAPILLARY
Glucose-Capillary: 113 mg/dL — ABNORMAL HIGH (ref 65–99)
Glucose-Capillary: 187 mg/dL — ABNORMAL HIGH (ref 65–99)
Glucose-Capillary: 161 mg/dL — ABNORMAL HIGH (ref 65–99)
Glucose-Capillary: 161 mg/dL — ABNORMAL HIGH (ref 65–99)
Glucose-Capillary: 141 mg/dL — ABNORMAL HIGH (ref 65–99)

## 2017-12-10 LAB — PROCALCITONIN: Procalcitonin: <0.1 ng/mL

## 2017-12-10 LAB — POCT I-STAT 3, ART BLOOD GAS (G3+)
Acid-Base Excess: 1 mmol/L (ref 0.0–2.0)
Acid-Base Excess: 1 mmol/L (ref 0.0–2.0)
Acid-Base Excess: 2 mmol/L (ref 0.0–2.0)
Acid-Base Excess: 3 mmol/L — ABNORMAL HIGH (ref 0.0–2.0)
Bicarbonate: 29.6 mmol/L — ABNORMAL HIGH (ref 20.0–28.0)
Bicarbonate: 31.3 mmol/L — ABNORMAL HIGH (ref 20.0–28.0)
Bicarbonate: 31.4 mmol/L — ABNORMAL HIGH (ref 20.0–28.0)
Bicarbonate: 32.4 mmol/L — ABNORMAL HIGH (ref 20.0–28.0)
O2 Saturation: 83 %
O2 Saturation: 89 %
O2 Saturation: 93 %
O2 Saturation: 95 %
Patient temperature: 98.6
Patient temperature: 98.6
Patient temperature: 98.6
Patient temperature: 98.7
TCO2: 32 mmol/L (ref 22–32)
TCO2: 34 mmol/L — ABNORMAL HIGH (ref 22–32)
TCO2: 34 mmol/L — ABNORMAL HIGH (ref 22–32)
TCO2: 35 mmol/L — ABNORMAL HIGH (ref 22–32)
pCO2 arterial: 63.1 mmHg — ABNORMAL HIGH (ref 32.0–48.0)
pCO2 arterial: 72 mmHg (ref 32.0–48.0)
pCO2 arterial: 73.7 mmHg (ref 32.0–48.0)
pCO2 arterial: 74.6 mmHg (ref 32.0–48.0)
pH, Arterial: 7.231 — ABNORMAL LOW (ref 7.350–7.450)
pH, Arterial: 7.248 — ABNORMAL LOW (ref 7.350–7.450)
pH, Arterial: 7.252 — ABNORMAL LOW (ref 7.350–7.450)
pH, Arterial: 7.279 — ABNORMAL LOW (ref 7.350–7.450)
pO2, Arterial: 55 mmHg — ABNORMAL LOW (ref 83.0–108.0)
pO2, Arterial: 69 mmHg — ABNORMAL LOW (ref 83.0–108.0)
pO2, Arterial: 80 mmHg — ABNORMAL LOW (ref 83.0–108.0)
pO2, Arterial: 88 mmHg (ref 83.0–108.0)

## 2017-12-10 LAB — CBC
HCT: 47.3 % — ABNORMAL HIGH (ref 36.0–46.0)
Hemoglobin: 14.4 g/dL (ref 12.0–15.0)
MCH: 32.8 pg (ref 26.0–34.0)
MCHC: 30.4 g/dL (ref 30.0–36.0)
MCV: 107.7 fL — ABNORMAL HIGH (ref 78.0–100.0)
Platelets: 171 10*3/uL (ref 150–400)
RBC: 4.39 MIL/uL (ref 3.87–5.11)
RDW: 15.4 % (ref 11.5–15.5)
WBC: 13.3 10*3/uL — ABNORMAL HIGH (ref 4.0–10.5)

## 2017-12-10 LAB — RESPIRATORY PANEL BY PCR

## 2017-12-10 LAB — RAPID URINE DRUG SCREEN, HOSP PERFORMED
Amphetamines: NOT DETECTED
Barbiturates: NOT DETECTED
Benzodiazepines: NOT DETECTED
Cocaine: NOT DETECTED
Opiates: POSITIVE — AB
Tetrahydrocannabinol: NOT DETECTED

## 2017-12-10 LAB — BRAIN NATRIURETIC PEPTIDE: B Natriuretic Peptide: 333.4 pg/mL — ABNORMAL HIGH (ref 0.0–100.0)

## 2017-12-10 LAB — LACTIC ACID, PLASMA: Lactic Acid, Venous: 0.9 mmol/L (ref 0.5–1.9)

## 2017-12-10 LAB — HIV ANTIBODY (ROUTINE TESTING W REFLEX): HIV Screen 4th Generation wRfx: NONREACTIVE

## 2017-12-10 LAB — MAGNESIUM: Magnesium: 2.5 mg/dL — ABNORMAL HIGH (ref 1.7–2.4)

## 2017-12-10 LAB — PHOSPHORUS: Phosphorus: 5.4 mg/dL — ABNORMAL HIGH (ref 2.5–4.6)

## 2017-12-10 LAB — MRSA PCR SCREENING: MRSA by PCR: NEGATIVE

## 2017-12-10 MED ORDER — FENTANYL CITRATE (PF) 100 MCG/2ML IJ SOLN: 100.0000 ug | INTRAMUSCULAR | Status: DC | PRN

## 2017-12-10 MED ORDER — ETOMIDATE 2 MG/ML IV SOLN
30.0000 mg | Freq: Once | INTRAVENOUS | Status: AC
  Administered 2017-12-10: 30 mg via INTRAVENOUS

## 2017-12-10 MED ORDER — NALOXONE HCL 0.4 MG/ML IJ SOLN
0.4000 mg | INTRAMUSCULAR | Status: DC | PRN
  Administered 2017-12-10: 0.4 mg via INTRAVENOUS

## 2017-12-10 MED ORDER — ORAL CARE MOUTH RINSE
15.0000 mL | Freq: Four times a day (QID) | OROMUCOSAL | Status: DC
  Administered 2017-12-10 – 2017-12-11 (×4): 15 mL via OROMUCOSAL

## 2017-12-10 MED ORDER — FENTANYL CITRATE (PF) 100 MCG/2ML IJ SOLN
INTRAMUSCULAR | Status: AC
  Administered 2017-12-10: 50 ug via INTRAVENOUS
  Filled 2017-12-10: qty 2

## 2017-12-10 MED ORDER — ALBUTEROL SULFATE (2.5 MG/3ML) 0.083% IN NEBU
2.5000 mg | INHALATION_SOLUTION | RESPIRATORY_TRACT | Status: DC | PRN
Start: 2017-12-10 — End: 2017-12-11

## 2017-12-10 MED ORDER — FENTANYL CITRATE (PF) 100 MCG/2ML IJ SOLN
100.0000 ug | INTRAMUSCULAR | Status: DC | PRN
  Administered 2017-12-10: 100 ug via INTRAVENOUS
  Filled 2017-12-10 (×2): qty 2

## 2017-12-10 MED ORDER — FAMOTIDINE 40 MG/5ML PO SUSR
20.0000 mg | Freq: Two times a day (BID) | ORAL | Status: DC
  Administered 2017-12-10 (×2): 20 mg
  Filled 2017-12-10 (×4): qty 2.5

## 2017-12-10 MED ORDER — MIDAZOLAM HCL 2 MG/2ML IJ SOLN: 2.0000 mg | INTRAMUSCULAR | Status: DC | PRN

## 2017-12-10 MED ORDER — ORAL CARE MOUTH RINSE
15.0000 mL | Freq: Four times a day (QID) | OROMUCOSAL | Status: DC
  Administered 2017-12-10: 15 mL via OROMUCOSAL

## 2017-12-10 MED ORDER — FENTANYL CITRATE (PF) 100 MCG/2ML IJ SOLN
50.0000 ug | Freq: Once | INTRAMUSCULAR | Status: AC
  Administered 2017-12-10: 50 ug via INTRAVENOUS

## 2017-12-10 MED ORDER — CHLORHEXIDINE GLUCONATE 0.12% ORAL RINSE (MEDLINE KIT): 15.0000 mL | Freq: Two times a day (BID) | OROMUCOSAL | Status: DC

## 2017-12-10 MED ORDER — PANTOPRAZOLE SODIUM 40 MG IV SOLR
40.0000 mg | INTRAVENOUS | Status: DC
  Filled 2017-12-10: qty 40

## 2017-12-10 MED ORDER — FENTANYL 2500MCG IN NS 250ML (10MCG/ML) PREMIX INFUSION
25.0000 ug/h | INTRAVENOUS | Status: DC
  Administered 2017-12-10: 25 ug/h via INTRAVENOUS
  Filled 2017-12-10: qty 250

## 2017-12-10 MED ORDER — MIDAZOLAM HCL 2 MG/2ML IJ SOLN
2.0000 mg | INTRAMUSCULAR | Status: DC | PRN
  Administered 2017-12-10: 2 mg via INTRAVENOUS
  Filled 2017-12-10 (×2): qty 2

## 2017-12-10 MED ORDER — DEXMEDETOMIDINE HCL IN NACL 400 MCG/100ML IV SOLN
0.4000 ug/kg/h | INTRAVENOUS | Status: DC
  Administered 2017-12-10 – 2017-12-11 (×3): 0.6 ug/kg/h via INTRAVENOUS
  Administered 2017-12-11: 0.7 ug/kg/h via INTRAVENOUS
  Filled 2017-12-10 (×4): qty 100

## 2017-12-10 MED ORDER — MIDAZOLAM HCL 2 MG/2ML IJ SOLN
INTRAMUSCULAR | Status: AC
  Administered 2017-12-10: 2 mg
  Filled 2017-12-10: qty 2

## 2017-12-10 MED ORDER — CHLORHEXIDINE GLUCONATE 0.12% ORAL RINSE (MEDLINE KIT)
15.0000 mL | Freq: Two times a day (BID) | OROMUCOSAL | Status: DC
Start: 2017-12-10 — End: 2017-12-11
  Administered 2017-12-10 – 2017-12-11 (×3): 15 mL via OROMUCOSAL

## 2017-12-10 MED ORDER — ROCURONIUM BROMIDE 50 MG/5ML IV SOLN
60.0000 mg | Freq: Once | INTRAVENOUS | Status: AC
  Administered 2017-12-10: 60 mg via INTRAVENOUS

## 2017-12-10 MED ORDER — FENTANYL CITRATE (PF) 100 MCG/2ML IJ SOLN
100.0000 ug | INTRAMUSCULAR | Status: DC | PRN
  Administered 2017-12-11: 100 ug via INTRAVENOUS
  Filled 2017-12-10: qty 2

## 2017-12-10 MED ORDER — FENTANYL BOLUS VIA INFUSION
50.0000 ug | INTRAVENOUS | Status: DC | PRN
  Filled 2017-12-10: qty 50

## 2017-12-10 NOTE — Procedures (Signed)
Endotracheal Intubation Procedure Note Indication for endotracheal intubation: airway compromise Sedation: etomidate and midazolam Paralytic: rocuronium Equipment: Macintosh 3 laryngoscope blade and 7.23m cuffed endotracheal tube Cricoid Pressure: yes Number of attempts: 1 ETT location confirmed by by auscultation, by CXR and ETCO2 monitor.  Patient remains extremely somnolent not responding to sternal rub. Questionably protecting her airway. Neuro wants to obtain a Brain MRI tonight, but I fear she is not stable for this test on BIPAP in her current condition. Decided to intubate her. She tolerated procedure well. CXR pending.

## 2017-12-10 NOTE — Progress Notes (Signed)
Patient transported from 2M13 to MRI and back with no complications.

## 2017-12-10 NOTE — Progress Notes (Signed)
This note also relates to the following rows which could not be included: SpO2 - Cannot attach notes to unvalidated device data  Increased rate to 26 with ABG results showing acidosis. Md aware and okay with change

## 2017-12-10 NOTE — Progress Notes (Addendum)
PULMONARY / CRITICAL CARE MEDICINE   Name: Theresa [name] MRN: [id number] DOB: 12-Nov-1955    ADMISSION DATE:  12/09/2017 CONSULTATION DATE:  12/10/2017  REFERRING MD:  Dr. Dayna Barker   CHIEF COMPLAINT:  AMS   HISTORY OF PRESENT ILLNESS:   62 year old female with PMH of CHF, HTN, COPD on 2.5L [place] at home, OSA/OHS with CPAP at HS, current Tobacco Dependence (1ppd for last 30-40 years), Depression, Chronic Back Pain on narcotics.  Presented to ED 2/23 with reports of AMS. Husband states that patient has had flu like symptoms for last 4-6 weeks, was placed on steroid taper and nebs. Due to steroids as had hard time sleeping. States for last 2 nights patient has not slept and has not worn CPAP. Today husband found patient on couch with empty oxygen canister. When EMS arrived oxygen saturation was 60-70%. Also reported that recently patient's friend past away and patient has been depressed. Unsure if she took additional xanax today. Upon arrival to ED GCS 3-8. Given Narcan with minimal to no effect. PCCM consulted.   ABG 7.1/88/82. On initial assessment patient awakens to physical stimulation. BiPAP placed.    SUBJECTIVE:  RN reports pt remains sedate / follows commands.  Fentanyl gtt reduced to  25 mcg's.  Husband stepped out of room.    VITAL SIGNS: BP 121/75   Pulse 78   Temp 99 F (37.2 C) (Oral)   Resp (!) 26   Ht _0  (1.6 m)   Wt 247 lb 2.2 oz (112.1 kg)   SpO2 90%   BMI 43.78 kg/m   HEMODYNAMICS:    VENTILATOR SETTINGS: Vent Mode: PRVC FiO2 (%):  [40 %-50 %] 40 % Set Rate:  [10 bmp-26 bmp] 26 bmp Vt Set:  [450 mL] 450 mL PEEP:  [5 cmH20-6 cmH20] 5 cmH20 Plateau Pressure:  [20 cmH20-22 cmH20] 22 cmH20  INTAKE / OUTPUT: I/O last 3 completed shifts: In: 621 [I.V.:71; IV Piggyback:550] Out: 2620 [Urine:1075]  PHYSICAL EXAMINATION: General: obese female in NAD on vent HEENT: MM pink/moist, ETT Neuro: Awakens, follows commands, mild weakness on L (baseline) CV: s1s2 rrr, no  m/r/g PULM: even/non-labored, lungs bilaterally coarse with wheezing bilaterally  BT:DHRC, non-tender, bsx4 active  Extremities: warm/dry, no edema  Skin: no rashes or lesions  LABS:  BMET Recent Labs  Lab 12/09/17 1941 12/09/17 1954 12/10/17 0123  NA 139 140 143  K 5.9* 5.6* 4.5  CL 103 106 102  CO2 23  --  29  BUN 31* 34* 26*  CREATININE 1.35* 1.40* 1.04*  GLUCOSE 140* 137* 124*    Electrolytes Recent Labs  Lab 12/09/17 1941 12/10/17 0123  CALCIUM 8.7* 8.5*  MG  --  2.5*  PHOS  --  5.4*    CBC Recent Labs  Lab 12/09/17 1941 12/09/17 1954 12/10/17 0123  WBC 16.9*  --  13.3*  HGB 15.1* 16.3* 14.4  HCT 49.6* 48.0* 47.3*  PLT 191  --  171    Coag's Recent Labs  Lab 12/09/17 1941  APTT 36  INR 1.04    Sepsis Markers Recent Labs  Lab 12/10/17 0123  LATICACIDVEN 0.9  PROCALCITON <0.10    ABG Recent Labs  Lab 12/10/17 0213 12/10/17 0349 12/10/17 0546  PHART 7.252* 7.231* 7.279*  PCO2ART 73.7* 74.6* 63.1*  PO2ART 80.0* 69.0* 55.0*    Liver Enzymes Recent Labs  Lab 12/09/17 1941  AST 24  ALT 21  ALKPHOS 87  BILITOT 0.4  ALBUMIN 3.8    Cardiac  Enzymes No results for input(s): TROPONINI, PROBNP in the last 168 hours.  Glucose Recent Labs  Lab 12/09/17 2019 12/10/17 0347  GLUCAP 130* 113*    Imaging Ct Angio Head W Or Wo Contrast  Result Date: 12/09/2017 CLINICAL DATA:  Acute onset left-sided weakness and facial droop. Code stroke. EXAM: CT ANGIOGRAPHY HEAD AND NECK TECHNIQUE: Multidetector CT imaging of the head and neck was performed using the standard protocol during bolus administration of intravenous contrast. Multiplanar CT image reconstructions and MIPs were obtained to evaluate the vascular anatomy. Carotid stenosis measurements (when applicable) are obtained utilizing NASCET criteria, using the distal internal carotid diameter as the denominator. CONTRAST:  138m ISOVUE-370 IOPAMIDOL (ISOVUE-370) INJECTION 76% COMPARISON:   None. FINDINGS: CTA NECK FINDINGS Aortic arch: Atherosclerotic calcifications are present at the origins of the great vessels. There is no significant stenosis relative to the more distal vessels. Irregularity is greatest at the left subclavian artery. Right carotid system: The right common carotid artery demonstrates some atherosclerotic change. There is no focal stenosis. Calcifications are present at the right carotid bifurcation. There is focal tortuosity just beyond the bifurcation. There is no significant stenosis. Left carotid system: The left common carotid artery is within normal limits. Calcifications are present at the bifurcation without a significant stenosis. There is proximal tortuosity of the cervical left ICA without significant stenosis. The more distal cervical left ICA is normal. Vertebral arteries: The vertebral arteries originate from the subclavian arteries bilaterally. There is moderate stenosis with calcification at the origin of the left vertebral artery. There is no significant stenosis at the origin of the dominant right vertebral artery. Skeleton: Mild degenerative changes are present in the cervical spine without significant stenosis. Endplate changes are greatest at C6-7. Vertebral body heights are maintained. No focal lytic or blastic lesions are present. Other neck: The soft tissues the neck are otherwise unremarkable. No focal mucosal or submucosal lesions are present. The thyroid is normal. No significant adenopathy is present. Salivary glands are within normal limits. Upper chest: Centrilobular emphysematous changes are present. Mild dependent atelectasis is noted. There is no focal nodule or mass lesion. No significant airspace consolidation is present. The thoracic inlet is within normal limits. Review of the MIP images confirms the above findings CTA HEAD FINDINGS Anterior circulation: Atherosclerotic calcifications are present within the cavernous internal carotid arteries  bilaterally. There is no significant stenosis relative to the more distal vessel on either side through the ICA termini. The A1 and M1 segments are normal. The anterior communicating artery is patent. MCA bifurcations are intact. The ACA and MCA branch vessels are within normal limits. Posterior circulation: The right vertebral artery is slightly dominant to the left. PICA origins are visualized and normal. Vertebrobasilar junction is normal. The basilar artery is small, essentially terminating at the superior cerebellar arteries. Posterior cerebral arteries originate from the posterior communicating arteries bilaterally with small P1 segments. The PCA branch vessels demonstrate mild distal attenuation without a significant proximal stenosis or occlusion. Venous sinuses: The dural sinuses are patent. The right transverse sinus is dominant. The straight sinus and deep cerebral veins are intact. Cortical veins are within normal limits. Anatomic variants: Fetal type posterior cerebral arteries bilaterally. Review of the MIP images confirms the above findings IMPRESSION: 1. No emergent large vessel occlusion. 2. Atherosclerotic disease involving the aortic arch, carotid bifurcations, and cavernous internal carotid arteries without significant stenosis at these levels. 3. Moderate narrowing of the proximal left vertebral artery at the subclavian artery. 4. Fetal type posterior  cerebral arteries bilaterally with small P1 segments from the basilar tip. 5. Distal small vessel disease evident in the posterior circulation. These results were called by telephone at the time of interpretation on 12/09/2017 at 8:12 pm to Dr. Roland Rack , who verbally acknowledged these results. Electronically Signed   By: San Morelle M.D.   On: 12/09/2017 20:22   Ct Angio Neck W Or Wo Contrast  Result Date: 12/09/2017 CLINICAL DATA:  Acute onset left-sided weakness and facial droop. Code stroke. EXAM: CT ANGIOGRAPHY HEAD AND  NECK TECHNIQUE: Multidetector CT imaging of the head and neck was performed using the standard protocol during bolus administration of intravenous contrast. Multiplanar CT image reconstructions and MIPs were obtained to evaluate the vascular anatomy. Carotid stenosis measurements (when applicable) are obtained utilizing NASCET criteria, using the distal internal carotid diameter as the denominator. CONTRAST:  169m ISOVUE-370 IOPAMIDOL (ISOVUE-370) INJECTION 76% COMPARISON:  None. FINDINGS: CTA NECK FINDINGS Aortic arch: Atherosclerotic calcifications are present at the origins of the great vessels. There is no significant stenosis relative to the more distal vessels. Irregularity is greatest at the left subclavian artery. Right carotid system: The right common carotid artery demonstrates some atherosclerotic change. There is no focal stenosis. Calcifications are present at the right carotid bifurcation. There is focal tortuosity just beyond the bifurcation. There is no significant stenosis. Left carotid system: The left common carotid artery is within normal limits. Calcifications are present at the bifurcation without a significant stenosis. There is proximal tortuosity of the cervical left ICA without significant stenosis. The more distal cervical left ICA is normal. Vertebral arteries: The vertebral arteries originate from the subclavian arteries bilaterally. There is moderate stenosis with calcification at the origin of the left vertebral artery. There is no significant stenosis at the origin of the dominant right vertebral artery. Skeleton: Mild degenerative changes are present in the cervical spine without significant stenosis. Endplate changes are greatest at C6-7. Vertebral body heights are maintained. No focal lytic or blastic lesions are present. Other neck: The soft tissues the neck are otherwise unremarkable. No focal mucosal or submucosal lesions are present. The thyroid is normal. No significant  adenopathy is present. Salivary glands are within normal limits. Upper chest: Centrilobular emphysematous changes are present. Mild dependent atelectasis is noted. There is no focal nodule or mass lesion. No significant airspace consolidation is present. The thoracic inlet is within normal limits. Review of the MIP images confirms the above findings CTA HEAD FINDINGS Anterior circulation: Atherosclerotic calcifications are present within the cavernous internal carotid arteries bilaterally. There is no significant stenosis relative to the more distal vessel on either side through the ICA termini. The A1 and M1 segments are normal. The anterior communicating artery is patent. MCA bifurcations are intact. The ACA and MCA branch vessels are within normal limits. Posterior circulation: The right vertebral artery is slightly dominant to the left. PICA origins are visualized and normal. Vertebrobasilar junction is normal. The basilar artery is small, essentially terminating at the superior cerebellar arteries. Posterior cerebral arteries originate from the posterior communicating arteries bilaterally with small P1 segments. The PCA branch vessels demonstrate mild distal attenuation without a significant proximal stenosis or occlusion. Venous sinuses: The dural sinuses are patent. The right transverse sinus is dominant. The straight sinus and deep cerebral veins are intact. Cortical veins are within normal limits. Anatomic variants: Fetal type posterior cerebral arteries bilaterally. Review of the MIP images confirms the above findings IMPRESSION: 1. No emergent large vessel occlusion. 2. Atherosclerotic disease involving  the aortic arch, carotid bifurcations, and cavernous internal carotid arteries without significant stenosis at these levels. 3. Moderate narrowing of the proximal left vertebral artery at the subclavian artery. 4. Fetal type posterior cerebral arteries bilaterally with small P1 segments from the basilar  tip. 5. Distal small vessel disease evident in the posterior circulation. These results were called by telephone at the time of interpretation on 12/09/2017 at 8:12 pm to Dr. Roland Rack , who verbally acknowledged these results. Electronically Signed   By: San Morelle M.D.   On: 12/09/2017 20:22   Mr Brain Wo Contrast  Result Date: 12/10/2017 CLINICAL DATA:  LEFT-sided weakness. Found hypoxic, off oxygen December 09, 2017. History of COPD. EXAM: MRI HEAD WITHOUT CONTRAST TECHNIQUE: Multiplanar, multiecho pulse sequences of the brain and surrounding structures were obtained without intravenous contrast. COMPARISON:  CT HEAD December 09, 2017 and MRI of the head May 8th 2008 FINDINGS: Mild motion degraded examination. INTRACRANIAL CONTENTS: No reduced diffusion to suggest acute ischemia. No susceptibility artifact to suggest hemorrhage. The ventricles and sulci are normal for patient's age. Greater than expected T2 hyperintense signal perivascular spaces basal ganglia and pons associated with chronic small vessel ischemic disease. Confluent T2 hyperintense signal bilateral globus pallidus eye. Similar T2 bright signal bilateral globus pallidus. Patchy supratentorial white matter FLAIR T2 hyperintensities, increased from prior imaging. Faint linear T2 hyperintense signal through dorsal pons. No suspicious parenchymal signal, masses, mass effect. No abnormal extra-axial fluid collections. No extra-axial masses. VASCULAR: Normal major intracranial vascular flow voids present at skull base. SKULL AND UPPER CERVICAL SPINE: No abnormal sellar expansion. No suspicious calvarial bone marrow signal. Craniocervical junction maintained. SINUSES/ORBITS: Mild paranasal sinus mucosal thickening.The included ocular globes and orbital contents are non-suspicious. OTHER: Life-support lines in place. IMPRESSION: 1. No acute intracranial process. 2. Similar T2 bright signal bilateral globus pallidus seen with prior  metabolic or toxic insult, less likely PKAN. 3. Moderate chronic small vessel ischemic disease. 4. Mildly progressive pontine signal abnormality associated with neurodegenerative syndromes, however there are no corroborated changes. Finding could reflect small vessel ischemic changes. Electronically Signed   By: Elon Alas M.D.   On: 12/10/2017 05:24   Dg Chest Port 1 View  Result Date: 12/10/2017 CLINICAL DATA:  Line placement. EXAM: PORTABLE CHEST 1 VIEW COMPARISON:  Chest radiograph December 09, 2016 at 2029 hours FINDINGS: Endotracheal tube tip projects 2.8 cm above the carina. Nasogastric tube past GE junction, out of field-of-view. Stable cardiomegaly, pulmonary vascular congestion and interstitial prominence. Small LEFT pleural effusion, RIGHT costophrenic angle out of field-of-view. No pneumothorax. Soft tissue planes and included osseous structures are unchanged. IMPRESSION: Endotracheal tube tip projects 2.8 cm above the carina. Nasogastric tube past GE junction. Stable cardiomegaly, interstitial edema with small LEFT pleural effusion. Electronically Signed   By: Elon Alas M.D.   On: 12/10/2017 03:12   Dg Chest Port 1 View  Result Date: 12/09/2017 CLINICAL DATA:  Altered mental status.  CVA. EXAM: PORTABLE CHEST 1 VIEW COMPARISON:  None FINDINGS: UPPER limits normal heart size noted with pulmonary vascular congestion. There is no evidence of focal airspace disease, pulmonary edema, suspicious pulmonary nodule/mass, pleural effusion, or pneumothorax. No acute bony abnormalities are identified. IMPRESSION: UPPER limits normal heart size with pulmonary vascular congestion. Electronically Signed   By: Margarette Canada M.D.   On: 12/09/2017 21:02   Dg Abd Portable 1v  Result Date: 12/10/2017 CLINICAL DATA:  Line placement. EXAM: PORTABLE ABDOMEN - 1 VIEW COMPARISON:  None. FINDINGS: Nasogastric tube looped in  stomach, distal tip coursing out of view. Paucity of bowel gas in the included  abdomen. No pathologic calcifications upper abdomen. Soft tissue planes and included osseous structures are non suspicious. IMPRESSION: Nasogastric tube tip likely in distal stomach though, out of field-of-view. Nonspecific bowel gas pattern. Electronically Signed   By: Elon Alas M.D.   On: 12/10/2017 03:13   Ct Head Code Stroke Wo Contrast  Result Date: 12/09/2017 CLINICAL DATA:  Code stroke. Acute onset of left-sided facial weakness and facial droop. Last seen normal 4 hours ago. Focal neuro deficit, less than 6 hours, stroke suspected. EXAM: CT HEAD WITHOUT CONTRAST TECHNIQUE: Contiguous axial images were obtained from the base of the skull through the vertex without intravenous contrast. COMPARISON:  None. FINDINGS: Brain: No acute infarct, hemorrhage, or mass lesion is present. Mild periventricular white matter changes are noted. No other significant white matter disease is present. The brainstem and cerebellum are normal. Ventricles are of normal size. No significant extra-axial fluid collection is present. Vascular: Vascular calcifications are noted in the cavernous internal carotid arteries bilaterally. There is no hyperdense vessel. Skull: Calvarium is intact. No focal lytic or blastic lesions are present. Sinuses/Orbits: The paranasal sinuses and mastoid air cells are clear. Exophthalmos is noted bilaterally. Globes and orbits are otherwise within normal limits. ASPECTS [facility] Stroke Program Early CT Score) - Ganglionic level infarction (caudate, lentiform nuclei, internal capsule, insula, M1-M3 cortex): 7/7 - Supraganglionic infarction (M4-M6 cortex): 3/3 Total score (0-10 with 10 being normal): 10/10 IMPRESSION: 1. No acute intracranial abnormality. 2. Mild periventricular white matter hypoattenuation likely represents chronic microvascular disease. 3. Atherosclerosis. 4. ASPECTS is 10/10 The above was relayed via text pager to Dr. Leonel Ramsay on 12/09/2017 at 19:57 . Electronically Signed    By: San Morelle M.D.   On: 12/09/2017 20:07     STUDIES:  CT Head 2/23 > No acute intracranial abnormality.  Mild periventricular white matter hypoattenuation likely represents chronic microvascular disease.  Atherosclerosis.  CTA Head 2/23 > No emergent large vessel occlusion. Atherosclerotic disease involving the aortic arch, carotid bifurcations, and cavernous internal carotid arteries without significant stenosis at these levels.  Moderate narrowing of the proximal left vertebral artery at the subclavian artery.  Fetal type posterior cerebral arteries bilaterally with small P1 segments from the basilar tip.  Distal small vessel disease evident in the posterior circulation. MRI Brain 2/24 >> no acute intracranial process, similar T2 bright signal bilateral globus pallidus seen with prior metabolic or toxic insult, moderate chronic small vessel ischemic disease ECHO 2/24 >>   CULTURES: U/A 2/24 > Negative  Sputum 2/24 >> Blood 2/24 >> RVP 2/24 >>  ANTIBIOTICS: Vancomycin 2/24 >> 2/24 Zosyn 2/24 >>   SIGNIFICANT EVENTS: 2/23  Admitted with AMS, hypercarbic respiratory failure > tx to ICU  LINES/TUBES: PIV   DISCUSSION: 62 year old female with COPD and recent flu like symptoms, presents to ED with AMS. CO2 88.1. Placed on BiPAP and transferred to ICU.    ASSESSMENT / PLAN:  PULMONARY A: Acute on Chronic Hypoxic/Hypercapnic Respiratory failure in setting of AECOPD secondary to Flu vs CAP  Respiratory Acidosis - ABG 7.170/88.1/82 H/O COPD, OHS/OSA on CPAP at HS, Tobacco Dependence  P:   PRVC 8cc/kg Increase rate to 25  Wean PEEP / FiO2 for sats > 88-95% Daily SBT / WUA Continue Brovana + Pulmicort  Continue Duoneb Q6 for now with bronchospasm PRN albuterol  Solumedrol 60 mg IV Q6  Intermittent CXR  Will need CPAP QHS post extubation  Smoking cessation counseling when able   CARDIOVASCULAR A:  Congestive Heart Failure (documented no previous echo in EMR)   H/O HTN  P:  ICU monitoring  Await ECHO  MAP goal > 65   RENAL A:   Hyperkalemia  P:   Trend BMP / urinary output Replace electrolytes as indicated Avoid nephrotoxic agents, ensure adequate renal perfusion  GASTROINTESTINAL A:   GERD Morbid Obesity  P:   NPO  Pepcid BID PT If not extubated 2/24, will need TF 2/25  HEMATOLOGIC A:   VTE Prophylaxis  P:  Trend CBC  Heparin for DVT prophylaxis   INFECTIOUS A:   Suspected Flu vs CAP  P:   Trend CBC, fever curve  Follow cultures as above  Empiric abx as above, discontinue vancomycin   ENDOCRINE A:   No issues    P:   Monitor glucose on BMP  NEUROLOGIC A:   Encephalopathy in setting of hypercarbia - UDS positive for opiates  Uncertain if patient took additional xanax, also takes norco at home   H/O Depression, Chronic Back Pain   P:   RASS Goal:  0 to -1  Minimize sedation as able  Discontinue IV fentanyl gtt > transition to PRN  PRN versed for sedation May need precedex gtt if does not tolerate intermittent sedation  Appreciate Neurology input > doubt acute neurological event  Follow serial neuro exams    FAMILY  - Updates:  No family at bedside.  Husband had stepped out of unit.  Will update on arrival.   - Inter-disciplinary family meet or Palliative Care meeting due by: 12/17/2017    Noe Gens, NP-C [facility] Pgr: [phone] or if no answer [phone] 12/10/2017, 8:05 AM

## 2017-12-10 NOTE — Progress Notes (Signed)
Neurology Progress Note   S:// Seen and examined. No acute complaints Intubated overnight for hypoxia and hypercapnea. MRI brain completed.   O:// Current vital signs: BP 121/75   Pulse 78   Temp 99 F (37.2 C) (Oral)   Resp (!) 26   Ht 5' 3" (1.6 m)   Wt 112.1 kg (247 lb 2.2 oz)   SpO2 90%   BMI 43.78 kg/m   Vital signs in last 24 hours: Temp:  [97.9 F (36.6 C)-99 F (37.2 C)] 99 F (37.2 C) (02/24 0348) Pulse Rate:  [74-90] 78 (02/24 0700) Resp:  [11-26] 26 (02/24 0700) BP: (81-143)/(53-99) 121/75 (02/24 0700) SpO2:  [90 %-100 %] 90 % (02/24 0700) FiO2 (%):  [40 %-50 %] 40 % (02/24 0525) Weight:  [108 kg (238 lb)-112.1 kg (247 lb 2.2 oz)] 112.1 kg (247 lb 2.2 oz) (02/24 0500) Sedated intubated. [facility] AT  S1S2+, rrr CTABL Abd NT ND BS+ Neurological Sedated on fentanyl, intubated Following commands Slow to open eyes to voice but responds appropriately Pupils pinpoint and sluggish b/l EOMI VFF Facial symmetry difficult to ascertain with ET tube Moving all 4 extremities, but left side is definitely weaker than right. This according to pt and family is somewhat baseline. No loss of sensation Could not reliably assess coordination Gait was not assessed due to current intubation   Medications  Current Facility-Administered Medications:  .  0.9 %  sodium chloride infusion, 250 mL, Intravenous, PRN, Omar Person, NP, Last Rate: 10 mL/hr at 12/10/17 0700, 250 mL at 12/10/17 0700 .  albuterol (PROVENTIL) (2.5 MG/3ML) 0.083% nebulizer solution 2.5 mg, 2.5 mg, Nebulization, Q4H PRN, Omar Person, NP .  arformoterol (BROVANA) nebulizer solution 15 mcg, 15 mcg, Nebulization, BID, Hammonds, Sharyn Blitz, MD .  budesonide (PULMICORT) nebulizer solution 0.5 mg, 0.5 mg, Nebulization, BID, Hammonds, Sharyn Blitz, MD .  chlorhexidine gluconate (MEDLINE KIT) (PERIDEX) 0.12 % solution 15 mL, 15 mL, Mouth Rinse, BID, Hammonds, Sharyn Blitz, MD, 15 mL at 12/10/17 0800 .   famotidine (PEPCID) 40 MG/5ML suspension 20 mg, 20 mg, Per Tube, BID, Hammonds, Sharyn Blitz, MD .  fentaNYL (SUBLIMAZE) bolus via infusion 50 mcg, 50 mcg, Intravenous, Q1H PRN, Omar Person, NP .  fentaNYL 2541mg in NS 2543m(1065mml) infusion-PREMIX, 25-400 mcg/hr, Intravenous, Continuous, Eubanks, Katalina M, NP, Last Rate: 5 mL/hr at 12/10/17 0700, 50 mcg/hr at 12/10/17 0700 .  heparin injection 5,000 Units, 5,000 Units, Subcutaneous, Q8H, EubOmar PersonP, 5,000 Units at 12/10/17 [id number] ipratropium-albuterol (DUONEB) 0.5-2.5 (3) MG/3ML nebulizer solution 3 mL, 3 mL, Nebulization, Q6H, Eubanks, Katalina M, NP, 3 mL at 12/10/17 0111 .  MEDLINE mouth rinse, 15 mL, Mouth Rinse, QID, Hammonds, KatSharyn BlitzD .  methylPREDNISolone sodium succinate (SOLU-MEDROL) 125 mg/2 mL injection 60 mg, 60 mg, Intravenous, Q6H, EubOmar PersonP, 60 mg at 12/10/17 [id number] midazolam (VERSED) injection 2 mg, 2 mg, Intravenous, Q2H PRN, EubOmar PersonP .  naloxone (NAVolusia Endoscopy And Surgery Centernjection 0.4 mg, 0.4 mg, Intravenous, PRN, EubOmar PersonP, 0.4 mg at 12/10/17 0007 .  pantoprazole (PROTONIX) injection 40 mg, 40 mg, Intravenous, Q24H, Eubanks, Katalina M, NP .  piperacillin-tazobactam (ZOSYN) IVPB 3.375 g, 3.375 g, Intravenous, Q8H, BryLaren EvertsPH, Last Rate: 12.5 mL/hr at 12/10/17 0639, 3.375 g at 12/10/17 0639 Labs CBC    Component Value Date/Time   WBC 13.3 (H) 12/10/2017 0123   RBC 4.39 12/10/2017 0123   HGB 14.4 12/10/2017 0123  HCT 47.3 (H) 12/10/2017 0123   PLT 171 12/10/2017 0123   MCV 107.7 (H) 12/10/2017 0123   MCH 32.8 12/10/2017 0123   MCHC 30.4 12/10/2017 0123   RDW 15.4 12/10/2017 0123   LYMPHSABS 1.8 12/09/2017 1941   MONOABS 0.6 12/09/2017 1941   EOSABS 0.0 12/09/2017 1941   BASOSABS 0.0 12/09/2017 1941    CMP     Component Value Date/Time   NA 143 12/10/2017 0123   K 4.5 12/10/2017 0123   CL 102 12/10/2017 0123   CO2 29 12/10/2017 0123   GLUCOSE  124 (H) 12/10/2017 0123   BUN 26 (H) 12/10/2017 0123   CREATININE 1.04 (H) 12/10/2017 0123   CALCIUM 8.5 (L) 12/10/2017 0123   PROT 6.7 12/09/2017 1941   ALBUMIN 3.8 12/09/2017 1941   AST 24 12/09/2017 1941   ALT 21 12/09/2017 1941   ALKPHOS 87 12/09/2017 1941   BILITOT 0.4 12/09/2017 1941   GFRNONAA 57 (L) 12/10/2017 0123   GFRAA >60 12/10/2017 0123    Imaging I have reviewed images in epic and the results pertinent to this consultation are: MRI examination of the brain - no acute changes, no bleed or stroke. T2 hyperintensities in deep GM, per report unchanged from prior.  Likely reflective of prior toxic/metabolic insult with chronic small vessel ischemic changes.  Assessment:  61/F with COPD, brought in with hypoxia of unclear duration. Noted to have weakness all over but left >right. Family reports that left sided weakness is somewhat baseline. She is otherwise non focal. MRI shows no stroke. Likely hypoxic encephalopathy. No suspicion clinically for seizures. Some occasional twitching might be polymyoclonus in the setting of tox/metabolic derangements.  Impression: Hypoxic encephalopathy Toxic/metabolic encephalopathy  Recommendations: No further recs for a neurological work up. No suspicion for seizures based on exam and history. No need for AEDs. Do not see a need for EEG. Respiratory support and medical management per PCCM as you are. Please recall neurology for any concerns. We will be available as needed.  -- Amie Portland, MD [facility] Pager: [phone] If 7pm to 7am, please call on call as listed on AMION.  CRITICAL CARE ATTESTATION This patient is critically ill and at significant risk of neurological worsening, death and care requires constant monitoring of vital signs, hemodynamics,respiratory and cardiac monitoring. I spent   30 minutes of neurocritical care time performing neurological assessment, discussion with family, other specialists  and medical decision making of high complexityin the care of  this patient.

## 2017-12-10 NOTE — Consult Note (Addendum)
PULMONARY / CRITICAL CARE MEDICINE   Name: Theresa [name] MRN: [id number] DOB: Apr 03, 1956    ADMISSION DATE:  12/09/2017 CONSULTATION DATE:  12/10/2017  REFERRING MD:  Dr. Dayna Barker   CHIEF COMPLAINT:  AMS   HISTORY OF PRESENT ILLNESS:   62 year old female with PMH of CHF, HTN, COPD on 2.5L [place] at home, OSA/OHS with CPAP at HS, current Tobacco Dependence (1ppd for last 30-40 years), Depression, Chronic Back Pain    Presents to ED with AMS. Husband states that patient has had flu like symptoms for last 4-6 weeks, was placed on steroid taper and nebs. Due to steroids as had hard time sleeping. States for last 2 nights patient has not slept and has not worn CPAP. Today husband found patient on couch with empty oxygen canister. When EMS arrived oxygen saturation was 60-70%. Also reported that recently patient's friend past away and patient has been depressed. Unsure if she took additional xanax today. Upon arrival to ED GCS 3-8. Given Narcan with minimal to no effect. PCCM consulted.   ABG 7.1/88/82. On initial assessment patient awakens to physical stimulation. BiPAP placed.   PAST MEDICAL HISTORY :  She  has a past medical history of CHF (congestive heart failure) ([place]), COPD (chronic obstructive pulmonary disease) ([place]), and Hypertension.  PAST SURGICAL HISTORY: She  has a past surgical history that includes Tubal ligation.  Allergies  Allergen Reactions  . Codeine Nausea And Vomiting  . Meloxicam Other (See Comments)    Stomach Pain    No current facility-administered medications on file prior to encounter.    Current Outpatient Medications on File Prior to Encounter  Medication Sig  . ADVAIR DISKUS 250-50 MCG/DOSE AEPB Inhale 1 puff into the lungs 2 (two) times daily.  . bisoprolol-hydrochlorothiazide (ZIAC) 10-6.25 MG tablet Take 1 tablet by mouth daily.  . clotrimazole (MYCELEX) 10 MG troche Take 10 mg by mouth 4 (four) times daily. Dissolve lozenge in the mouth. DO NOT SWALLOW  WHOLE.  Marland Kitchen DALIRESP 500 MCG TABS tablet Take 500 mcg by mouth daily.  . furosemide (LASIX) 40 MG tablet Take 40 mg by mouth 2 (two) times daily.  Marland Kitchen gabapentin (NEURONTIN) 100 MG capsule Take 300 mg by mouth at bedtime.   Marland Kitchen HYDROcodone-acetaminophen (NORCO/VICODIN) 5-325 MG tablet Take 1 tablet by mouth every 8 (eight) hours as needed for severe pain.  Marland Kitchen ipratropium-albuterol (DUONEB) 0.5-2.5 (3) MG/3ML SOLN Inhale 3 mLs into the lungs every 6 (six) hours as needed for shortness of breath.  . ketoconazole (NIZORAL) 2 % cream Apply 1 application topically daily.  . potassium chloride SA (K-DUR,KLOR-CON) 20 MEQ tablet Take 20 mEq by mouth 2 (two) times daily.  . sertraline (ZOLOFT) 100 MG tablet Take 200 mg by mouth every morning.  . traZODone (DESYREL) 50 MG tablet Take 75 mg by mouth at bedtime as needed.  . VENTOLIN HFA 108 (90 Base) MCG/ACT inhaler Inhale 2 puffs into the lungs every 6 (six) hours as needed for shortness of breath.  Marland Kitchen omeprazole (PRILOSEC) 20 MG capsule Take 20 mg by mouth daily.    FAMILY HISTORY:  Her has no family status information on file.    SOCIAL HISTORY: She  reports that she has been smoking.  she has never used smokeless tobacco. She reports that she does not drink alcohol or use drugs.  REVIEW OF SYSTEMS:   Unable to review as patient is encephalopathic   SUBJECTIVE:   VITAL SIGNS: BP 111/80   Pulse 78  Temp 97.9 F (36.6 C) (Axillary)   Resp 12   Ht _0  (1.6 m)   Wt 108 kg (238 lb)   SpO2 98%   BMI 42.16 kg/m   HEMODYNAMICS:    VENTILATOR SETTINGS: Vent Mode: PCV;BIPAP FiO2 (%):  [40 %] 40 % Set Rate:  [10 bmp] 10 bmp PEEP:  [6 cmH20] 6 cmH20  INTAKE / OUTPUT: No intake/output data recorded.  PHYSICAL EXAMINATION: General:  Adult female, no distress  Neuro:  Lethargic, awakens to sternal rub, weak cough, moves extremities  HEENT:  Dry MM  Cardiovascular:  RRR, no MRG  Lungs:  Exp Wheeze, Diminished to bases  Abdomen:  Obese,  active bowel sounds  Musculoskeletal:  +1 BLE  Skin:  Warm, dry   LABS:  BMET Recent Labs  Lab 12/09/17 1941 12/09/17 1954  NA 139 140  K 5.9* 5.6*  CL 103 106  CO2 23  --   BUN 31* 34*  CREATININE 1.35* 1.40*  GLUCOSE 140* 137*    Electrolytes Recent Labs  Lab 12/09/17 1941  CALCIUM 8.7*    CBC Recent Labs  Lab 12/09/17 1941 12/09/17 1954  WBC 16.9*  --   HGB 15.1* 16.3*  HCT 49.6* 48.0*  PLT 191  --     Coag's Recent Labs  Lab 12/09/17 1941  APTT 36  INR 1.04    Sepsis Markers No results for input(s): LATICACIDVEN, PROCALCITON, O2SATVEN in the last 168 hours.  ABG Recent Labs  Lab 12/09/17 2331  PHART 7.170*  PCO2ART 88.1*  PO2ART 82.0*    Liver Enzymes Recent Labs  Lab 12/09/17 1941  AST 24  ALT 21  ALKPHOS 87  BILITOT 0.4  ALBUMIN 3.8    Cardiac Enzymes No results for input(s): TROPONINI, PROBNP in the last 168 hours.  Glucose Recent Labs  Lab 12/09/17 2019  GLUCAP 130*    Imaging Ct Angio Head W Or Wo Contrast  Result Date: 12/09/2017 CLINICAL DATA:  Acute onset left-sided weakness and facial droop. Code stroke. EXAM: CT ANGIOGRAPHY HEAD AND NECK TECHNIQUE: Multidetector CT imaging of the head and neck was performed using the standard protocol during bolus administration of intravenous contrast. Multiplanar CT image reconstructions and MIPs were obtained to evaluate the vascular anatomy. Carotid stenosis measurements (when applicable) are obtained utilizing NASCET criteria, using the distal internal carotid diameter as the denominator. CONTRAST:  162m ISOVUE-370 IOPAMIDOL (ISOVUE-370) INJECTION 76% COMPARISON:  None. FINDINGS: CTA NECK FINDINGS Aortic arch: Atherosclerotic calcifications are present at the origins of the great vessels. There is no significant stenosis relative to the more distal vessels. Irregularity is greatest at the left subclavian artery. Right carotid system: The right common carotid artery demonstrates some  atherosclerotic change. There is no focal stenosis. Calcifications are present at the right carotid bifurcation. There is focal tortuosity just beyond the bifurcation. There is no significant stenosis. Left carotid system: The left common carotid artery is within normal limits. Calcifications are present at the bifurcation without a significant stenosis. There is proximal tortuosity of the cervical left ICA without significant stenosis. The more distal cervical left ICA is normal. Vertebral arteries: The vertebral arteries originate from the subclavian arteries bilaterally. There is moderate stenosis with calcification at the origin of the left vertebral artery. There is no significant stenosis at the origin of the dominant right vertebral artery. Skeleton: Mild degenerative changes are present in the cervical spine without significant stenosis. Endplate changes are greatest at C6-7. Vertebral body heights are maintained. No focal  lytic or blastic lesions are present. Other neck: The soft tissues the neck are otherwise unremarkable. No focal mucosal or submucosal lesions are present. The thyroid is normal. No significant adenopathy is present. Salivary glands are within normal limits. Upper chest: Centrilobular emphysematous changes are present. Mild dependent atelectasis is noted. There is no focal nodule or mass lesion. No significant airspace consolidation is present. The thoracic inlet is within normal limits. Review of the MIP images confirms the above findings CTA HEAD FINDINGS Anterior circulation: Atherosclerotic calcifications are present within the cavernous internal carotid arteries bilaterally. There is no significant stenosis relative to the more distal vessel on either side through the ICA termini. The A1 and M1 segments are normal. The anterior communicating artery is patent. MCA bifurcations are intact. The ACA and MCA branch vessels are within normal limits. Posterior circulation: The right  vertebral artery is slightly dominant to the left. PICA origins are visualized and normal. Vertebrobasilar junction is normal. The basilar artery is small, essentially terminating at the superior cerebellar arteries. Posterior cerebral arteries originate from the posterior communicating arteries bilaterally with small P1 segments. The PCA branch vessels demonstrate mild distal attenuation without a significant proximal stenosis or occlusion. Venous sinuses: The dural sinuses are patent. The right transverse sinus is dominant. The straight sinus and deep cerebral veins are intact. Cortical veins are within normal limits. Anatomic variants: Fetal type posterior cerebral arteries bilaterally. Review of the MIP images confirms the above findings IMPRESSION: 1. No emergent large vessel occlusion. 2. Atherosclerotic disease involving the aortic arch, carotid bifurcations, and cavernous internal carotid arteries without significant stenosis at these levels. 3. Moderate narrowing of the proximal left vertebral artery at the subclavian artery. 4. Fetal type posterior cerebral arteries bilaterally with small P1 segments from the basilar tip. 5. Distal small vessel disease evident in the posterior circulation. These results were called by telephone at the time of interpretation on 12/09/2017 at 8:12 pm to Dr. Roland Rack , who verbally acknowledged these results. Electronically Signed   By: San Morelle M.D.   On: 12/09/2017 20:22   Ct Angio Neck W Or Wo Contrast  Result Date: 12/09/2017 CLINICAL DATA:  Acute onset left-sided weakness and facial droop. Code stroke. EXAM: CT ANGIOGRAPHY HEAD AND NECK TECHNIQUE: Multidetector CT imaging of the head and neck was performed using the standard protocol during bolus administration of intravenous contrast. Multiplanar CT image reconstructions and MIPs were obtained to evaluate the vascular anatomy. Carotid stenosis measurements (when applicable) are obtained  utilizing NASCET criteria, using the distal internal carotid diameter as the denominator. CONTRAST:  179m ISOVUE-370 IOPAMIDOL (ISOVUE-370) INJECTION 76% COMPARISON:  None. FINDINGS: CTA NECK FINDINGS Aortic arch: Atherosclerotic calcifications are present at the origins of the great vessels. There is no significant stenosis relative to the more distal vessels. Irregularity is greatest at the left subclavian artery. Right carotid system: The right common carotid artery demonstrates some atherosclerotic change. There is no focal stenosis. Calcifications are present at the right carotid bifurcation. There is focal tortuosity just beyond the bifurcation. There is no significant stenosis. Left carotid system: The left common carotid artery is within normal limits. Calcifications are present at the bifurcation without a significant stenosis. There is proximal tortuosity of the cervical left ICA without significant stenosis. The more distal cervical left ICA is normal. Vertebral arteries: The vertebral arteries originate from the subclavian arteries bilaterally. There is moderate stenosis with calcification at the origin of the left vertebral artery. There is no significant stenosis at the  origin of the dominant right vertebral artery. Skeleton: Mild degenerative changes are present in the cervical spine without significant stenosis. Endplate changes are greatest at C6-7. Vertebral body heights are maintained. No focal lytic or blastic lesions are present. Other neck: The soft tissues the neck are otherwise unremarkable. No focal mucosal or submucosal lesions are present. The thyroid is normal. No significant adenopathy is present. Salivary glands are within normal limits. Upper chest: Centrilobular emphysematous changes are present. Mild dependent atelectasis is noted. There is no focal nodule or mass lesion. No significant airspace consolidation is present. The thoracic inlet is within normal limits. Review of the MIP  images confirms the above findings CTA HEAD FINDINGS Anterior circulation: Atherosclerotic calcifications are present within the cavernous internal carotid arteries bilaterally. There is no significant stenosis relative to the more distal vessel on either side through the ICA termini. The A1 and M1 segments are normal. The anterior communicating artery is patent. MCA bifurcations are intact. The ACA and MCA branch vessels are within normal limits. Posterior circulation: The right vertebral artery is slightly dominant to the left. PICA origins are visualized and normal. Vertebrobasilar junction is normal. The basilar artery is small, essentially terminating at the superior cerebellar arteries. Posterior cerebral arteries originate from the posterior communicating arteries bilaterally with small P1 segments. The PCA branch vessels demonstrate mild distal attenuation without a significant proximal stenosis or occlusion. Venous sinuses: The dural sinuses are patent. The right transverse sinus is dominant. The straight sinus and deep cerebral veins are intact. Cortical veins are within normal limits. Anatomic variants: Fetal type posterior cerebral arteries bilaterally. Review of the MIP images confirms the above findings IMPRESSION: 1. No emergent large vessel occlusion. 2. Atherosclerotic disease involving the aortic arch, carotid bifurcations, and cavernous internal carotid arteries without significant stenosis at these levels. 3. Moderate narrowing of the proximal left vertebral artery at the subclavian artery. 4. Fetal type posterior cerebral arteries bilaterally with small P1 segments from the basilar tip. 5. Distal small vessel disease evident in the posterior circulation. These results were called by telephone at the time of interpretation on 12/09/2017 at 8:12 pm to Dr. Roland Rack , who verbally acknowledged these results. Electronically Signed   By: San Morelle M.D.   On: 12/09/2017 20:22    Dg Chest Port 1 View  Result Date: 12/09/2017 CLINICAL DATA:  Altered mental status.  CVA. EXAM: PORTABLE CHEST 1 VIEW COMPARISON:  None FINDINGS: UPPER limits normal heart size noted with pulmonary vascular congestion. There is no evidence of focal airspace disease, pulmonary edema, suspicious pulmonary nodule/mass, pleural effusion, or pneumothorax. No acute bony abnormalities are identified. IMPRESSION: UPPER limits normal heart size with pulmonary vascular congestion. Electronically Signed   By: Margarette Canada M.D.   On: 12/09/2017 21:02   Ct Head Code Stroke Wo Contrast  Result Date: 12/09/2017 CLINICAL DATA:  Code stroke. Acute onset of left-sided facial weakness and facial droop. Last seen normal 4 hours ago. Focal neuro deficit, less than 6 hours, stroke suspected. EXAM: CT HEAD WITHOUT CONTRAST TECHNIQUE: Contiguous axial images were obtained from the base of the skull through the vertex without intravenous contrast. COMPARISON:  None. FINDINGS: Brain: No acute infarct, hemorrhage, or mass lesion is present. Mild periventricular white matter changes are noted. No other significant white matter disease is present. The brainstem and cerebellum are normal. Ventricles are of normal size. No significant extra-axial fluid collection is present. Vascular: Vascular calcifications are noted in the cavernous internal carotid arteries bilaterally. There is  no hyperdense vessel. Skull: Calvarium is intact. No focal lytic or blastic lesions are present. Sinuses/Orbits: The paranasal sinuses and mastoid air cells are clear. Exophthalmos is noted bilaterally. Globes and orbits are otherwise within normal limits. ASPECTS [facility] Stroke Program Early CT Score) - Ganglionic level infarction (caudate, lentiform nuclei, internal capsule, insula, M1-M3 cortex): 7/7 - Supraganglionic infarction (M4-M6 cortex): 3/3 Total score (0-10 with 10 being normal): 10/10 IMPRESSION: 1. No acute intracranial abnormality. 2. Mild  periventricular white matter hypoattenuation likely represents chronic microvascular disease. 3. Atherosclerosis. 4. ASPECTS is 10/10 The above was relayed via text pager to Dr. Leonel Ramsay on 12/09/2017 at 19:57 . Electronically Signed   By: San Morelle M.D.   On: 12/09/2017 20:07     STUDIES:  CT Head 2/23 >. No acute intracranial abnormality. 2. Mild periventricular white matter hypoattenuation likely represents chronic microvascular disease. 3. Atherosclerosis.  CTA Head 2/23 > . No emergent large vessel occlusion. 2. Atherosclerotic disease involving the aortic arch, carotid bifurcations, and cavernous internal carotid arteries without significant stenosis at these levels. 3. Moderate narrowing of the proximal left vertebral artery at the subclavian artery. 4. Fetal type posterior cerebral arteries bilaterally with small P1 segments from the basilar tip. 5. Distal small vessel disease evident in the posterior circulation. CXR 2/23 > UPPER limits normal heart size noted with pulmonary vascular congestion.There is no evidence of focal airspace disease, pulmonary edema, suspicious pulmonary nodule/mass, pleural effusion, or pneumothorax.No acute bony abnormalities are identified.  CULTURES: U/A 2/24 > Negative  Sputum 2/24 >> Blood 2/24 >>   ANTIBIOTICS: Vancomycin 2/24 >> Zosyn 2/24 >>   SIGNIFICANT EVENTS: 2/23 > Presents to ED   LINES/TUBES: PIV   DISCUSSION: 62 year old female with COPD and recent flu like symptoms, presents to ED with AMS. CO2 88.1. Placed on BiPAP and transferred to ICU.    ASSESSMENT / PLAN:  PULMONARY A: Acute on Chronic Hypoxic/Hypercapnic Respiratory failure in setting of AECOPD secondary to Flu vs CAP  Respiratory Acidosis  ABG 7.170/88.1/82 H/O COPD, OHS/OSA on CPAP at HS, Tobacco Dependence  P:   BiPAP  Trend ABG > Repeat at 0100 (evaluate to assess need for intubation)  Trend CXR Pulmonary Hygiene   Duoneb/Brovana/Pulimicort Solu-medrol 60 q6h  RVP Pending   CARDIOVASCULAR A:  Congestive Heart Failure (documented no previous echo in EMR)  H/O HTN  P:  Cardiac Monitoring  Maintain MAP > 65  ECHO pending   RENAL A:   Hyperkalemia  K 5.9 >> P:   Trend BMP  Replace electrolytes as indicated  LA pending   GASTROINTESTINAL A:   GERD P:   NPO PPI   HEMATOLOGIC A:   VTE Prophylaxis  P:  Trend CBC  Maintain Hemoglobin > 7   INFECTIOUS A:   Suspected Flu vs CAP  -CXR with no infiltrates  P:   Trend WBC and Fever Curve Trend PCT and LA  Follow Culture Data  Vancomycin/Zosyn   ENDOCRINE A:   No issues    P:   Trend Glucose   NEUROLOGIC A:   Encephalopathy in setting of hypercarbia  Uncertain if patient took additional xanax, also takes norco at home   H/O Depression, Chronic Back Pain   P:   Monitor  Hold sedating medications  UDS pending  CTA negative, MRI pending    FAMILY  - Updates: Husband at bedside at updated on plan.   - Inter-disciplinary family meet or Palliative Care meeting due by: 12/17/2017  CC Time: 52 minutes   Hayden Pedro, AGACNP-BC [facility]  Pgr: [phone]  PCCM Pgr: [phone]

## 2017-12-10 NOTE — ED Provider Notes (Signed)
[facility] EMERGENCY DEPARTMENT Provider Note   CSN: [id number] Arrival date & time: 12/09/17  3295   An emergency department physician performed an initial assessment on this suspected stroke patient at 3.  History   Chief Complaint No chief complaint on file.   HPI Theresa [name] is a 62 y.o. female.   Neurologic Problem  This is a new problem. The current episode started 3 to 5 hours ago. The problem occurs constantly. The problem has not changed since onset.Associated symptoms include shortness of breath. Pertinent negatives include no chest pain and no headaches. Nothing aggravates the symptoms. Nothing relieves the symptoms. She has tried nothing for the symptoms.    Past Medical History:  Diagnosis Date  . CHF (congestive heart failure) ([place])   . COPD (chronic obstructive pulmonary disease) ([place])   . Hypertension     Patient Active Problem List   Diagnosis Date Noted  . Altered mental status, unspecified 12/09/2017  . Acute respiratory failure ([place]) 12/09/2017    Past Surgical History:  Procedure Laterality Date  . TUBAL LIGATION      OB History    No data available       Home Medications    Prior to Admission medications   Medication Sig Start Date End Date Taking? Authorizing Provider  ADVAIR DISKUS 250-50 MCG/DOSE AEPB Inhale 1 puff into the lungs 2 (two) times daily. 11/29/17  Yes [provider]  bisoprolol-hydrochlorothiazide (ZIAC) 10-6.25 MG tablet Take 1 tablet by mouth daily. 12/05/17  Yes [provider]  clotrimazole (MYCELEX) 10 MG troche Take 10 mg by mouth 4 (four) times daily. Dissolve lozenge in the mouth. DO NOT SWALLOW WHOLE. 12/05/17  Yes [provider]  DALIRESP 500 MCG TABS tablet Take 500 mcg by mouth daily. 12/05/17  Yes [provider]  furosemide (LASIX) 40 MG tablet Take 40 mg by mouth 2 (two) times daily. 12/05/17  Yes [provider]  gabapentin (NEURONTIN) 100 MG  capsule Take 300 mg by mouth at bedtime.  12/05/17  Yes [provider]  HYDROcodone-acetaminophen (NORCO/VICODIN) 5-325 MG tablet Take 1 tablet by mouth every 8 (eight) hours as needed for severe pain. 11/23/17  Yes [provider]  ipratropium-albuterol (DUONEB) 0.5-2.5 (3) MG/3ML SOLN Inhale 3 mLs into the lungs every 6 (six) hours as needed for shortness of breath. 11/15/17  Yes [provider]  ketoconazole (NIZORAL) 2 % cream Apply 1 application topically daily. 12/05/17  Yes [provider]  potassium chloride SA (K-DUR,KLOR-CON) 20 MEQ tablet Take 20 mEq by mouth 2 (two) times daily. 12/05/17  Yes [provider]  sertraline (ZOLOFT) 100 MG tablet Take 200 mg by mouth every morning. 11/29/17  Yes [provider]  traZODone (DESYREL) 50 MG tablet Take 75 mg by mouth at bedtime as needed. 11/05/17  Yes [provider]  VENTOLIN HFA 108 (90 Base) MCG/ACT inhaler Inhale 2 puffs into the lungs every 6 (six) hours as needed for shortness of breath. 11/15/17  Yes [provider]  omeprazole (PRILOSEC) 20 MG capsule Take 20 mg by mouth daily. 10/23/17   [provider]    Family History History reviewed. No pertinent family history.  Social History Social History   Tobacco Use  . Smoking status: Current Every Day Smoker  . Smokeless tobacco: Never Used  Substance Use Topics  . Alcohol use: No    Frequency: Never  . Drug use: No     Allergies   Codeine and  Meloxicam   Review of Systems Review of Systems  Respiratory: Positive for shortness of breath.   Cardiovascular: Negative for chest pain.  Neurological: Negative for headaches.  All other systems reviewed and are negative.    Physical Exam Updated Vital Signs BP 111/80   Pulse 78   Temp 97.9 F (36.6 C) (Axillary)   Resp 12   Ht _0  (1.6 m)   Wt 108 kg (238 lb)   SpO2 98%   BMI 42.16 kg/m   Physical Exam  Constitutional: She appears  well-developed and well-nourished.  HENT:  Head: Normocephalic and atraumatic.  Eyes: Conjunctivae and EOM are normal.  Neck: Normal range of motion.  Cardiovascular: Normal rate and regular rhythm.  Pulmonary/Chest: Effort normal and breath sounds normal. No stridor. No respiratory distress.  Abdominal: She exhibits no distension.  Neurological: No cranial nerve deficit or sensory deficit. GCS eye subscore is 2. GCS verbal subscore is 3. GCS motor subscore is 5.  Decreased strength on left arm and leg  Skin: Skin is warm and dry.  Nursing note and vitals reviewed.    ED Treatments / Results  Labs (all labs ordered are listed, but only abnormal results are displayed) Labs Reviewed  CBC - Abnormal; Notable for the following components:      Result Value   WBC 16.9 (*)    Hemoglobin 15.1 (*)    HCT 49.6 (*)    MCV 108.3 (*)    RDW 15.8 (*)    All other components within normal limits  DIFFERENTIAL - Abnormal; Notable for the following components:   Neutro Abs 14.4 (*)    All other components within normal limits  COMPREHENSIVE METABOLIC PANEL - Abnormal; Notable for the following components:   Potassium 5.9 (*)    Glucose, Bld 140 (*)    BUN 31 (*)    Creatinine, Ser 1.35 (*)    Calcium 8.7 (*)    GFR calc non Af Amer 42 (*)    GFR calc Af Amer 48 (*)    All other components within normal limits  CBG MONITORING, ED - Abnormal; Notable for the following components:   Glucose-Capillary 130 (*)    All other components within normal limits  I-STAT CHEM 8, ED - Abnormal; Notable for the following components:   Potassium 5.6 (*)    BUN 34 (*)    Creatinine, Ser 1.40 (*)    Glucose, Bld 137 (*)    Calcium, Ion 0.98 (*)    Hemoglobin 16.3 (*)    HCT 48.0 (*)    All other components within normal limits  I-STAT ARTERIAL BLOOD GAS, ED - Abnormal; Notable for the following components:   pH, Arterial 7.170 (*)    pCO2 arterial 88.1 (*)    pO2, Arterial 82.0 (*)    Bicarbonate  32.1 (*)    TCO2 35 (*)    All other components within normal limits  RESPIRATORY PANEL BY PCR  CULTURE, BLOOD (ROUTINE X 2)  CULTURE, BLOOD (ROUTINE X 2)  CULTURE, EXPECTORATED SPUTUM-ASSESSMENT  PROTIME-INR  APTT  AMMONIA  URINALYSIS, ROUTINE W REFLEX MICROSCOPIC  RAPID URINE DRUG SCREEN, HOSP PERFORMED  PROCALCITONIN  PROCALCITONIN  LACTIC ACID, PLASMA  LACTIC ACID, PLASMA  BRAIN NATRIURETIC PEPTIDE  HIV ANTIBODY (ROUTINE TESTING)  BASIC METABOLIC PANEL  CBC  MAGNESIUM  PHOSPHORUS  BLOOD GAS, ARTERIAL  I-STAT TROPONIN, ED    EKG  EKG Interpretation None       Radiology Ct Angio Head  W Or Wo Contrast  Result Date: 12/09/2017 CLINICAL DATA:  Acute onset left-sided weakness and facial droop. Code stroke. EXAM: CT ANGIOGRAPHY HEAD AND NECK TECHNIQUE: Multidetector CT imaging of the head and neck was performed using the standard protocol during bolus administration of intravenous contrast. Multiplanar CT image reconstructions and MIPs were obtained to evaluate the vascular anatomy. Carotid stenosis measurements (when applicable) are obtained utilizing NASCET criteria, using the distal internal carotid diameter as the denominator. CONTRAST:  170m ISOVUE-370 IOPAMIDOL (ISOVUE-370) INJECTION 76% COMPARISON:  None. FINDINGS: CTA NECK FINDINGS Aortic arch: Atherosclerotic calcifications are present at the origins of the great vessels. There is no significant stenosis relative to the more distal vessels. Irregularity is greatest at the left subclavian artery. Right carotid system: The right common carotid artery demonstrates some atherosclerotic change. There is no focal stenosis. Calcifications are present at the right carotid bifurcation. There is focal tortuosity just beyond the bifurcation. There is no significant stenosis. Left carotid system: The left common carotid artery is within normal limits. Calcifications are present at the bifurcation without a significant stenosis. There  is proximal tortuosity of the cervical left ICA without significant stenosis. The more distal cervical left ICA is normal. Vertebral arteries: The vertebral arteries originate from the subclavian arteries bilaterally. There is moderate stenosis with calcification at the origin of the left vertebral artery. There is no significant stenosis at the origin of the dominant right vertebral artery. Skeleton: Mild degenerative changes are present in the cervical spine without significant stenosis. Endplate changes are greatest at C6-7. Vertebral body heights are maintained. No focal lytic or blastic lesions are present. Other neck: The soft tissues the neck are otherwise unremarkable. No focal mucosal or submucosal lesions are present. The thyroid is normal. No significant adenopathy is present. Salivary glands are within normal limits. Upper chest: Centrilobular emphysematous changes are present. Mild dependent atelectasis is noted. There is no focal nodule or mass lesion. No significant airspace consolidation is present. The thoracic inlet is within normal limits. Review of the MIP images confirms the above findings CTA HEAD FINDINGS Anterior circulation: Atherosclerotic calcifications are present within the cavernous internal carotid arteries bilaterally. There is no significant stenosis relative to the more distal vessel on either side through the ICA termini. The A1 and M1 segments are normal. The anterior communicating artery is patent. MCA bifurcations are intact. The ACA and MCA branch vessels are within normal limits. Posterior circulation: The right vertebral artery is slightly dominant to the left. PICA origins are visualized and normal. Vertebrobasilar junction is normal. The basilar artery is small, essentially terminating at the superior cerebellar arteries. Posterior cerebral arteries originate from the posterior communicating arteries bilaterally with small P1 segments. The PCA branch vessels demonstrate  mild distal attenuation without a significant proximal stenosis or occlusion. Venous sinuses: The dural sinuses are patent. The right transverse sinus is dominant. The straight sinus and deep cerebral veins are intact. Cortical veins are within normal limits. Anatomic variants: Fetal type posterior cerebral arteries bilaterally. Review of the MIP images confirms the above findings IMPRESSION: 1. No emergent large vessel occlusion. 2. Atherosclerotic disease involving the aortic arch, carotid bifurcations, and cavernous internal carotid arteries without significant stenosis at these levels. 3. Moderate narrowing of the proximal left vertebral artery at the subclavian artery. 4. Fetal type posterior cerebral arteries bilaterally with small P1 segments from the basilar tip. 5. Distal small vessel disease evident in the posterior circulation. These results were called by telephone at the time of interpretation on  12/09/2017 at 8:12 pm to Dr. Roland Rack , who verbally acknowledged these results. Electronically Signed   By: San Morelle M.D.   On: 12/09/2017 20:22   Ct Angio Neck W Or Wo Contrast  Result Date: 12/09/2017 CLINICAL DATA:  Acute onset left-sided weakness and facial droop. Code stroke. EXAM: CT ANGIOGRAPHY HEAD AND NECK TECHNIQUE: Multidetector CT imaging of the head and neck was performed using the standard protocol during bolus administration of intravenous contrast. Multiplanar CT image reconstructions and MIPs were obtained to evaluate the vascular anatomy. Carotid stenosis measurements (when applicable) are obtained utilizing NASCET criteria, using the distal internal carotid diameter as the denominator. CONTRAST:  113m ISOVUE-370 IOPAMIDOL (ISOVUE-370) INJECTION 76% COMPARISON:  None. FINDINGS: CTA NECK FINDINGS Aortic arch: Atherosclerotic calcifications are present at the origins of the great vessels. There is no significant stenosis relative to the more distal vessels.  Irregularity is greatest at the left subclavian artery. Right carotid system: The right common carotid artery demonstrates some atherosclerotic change. There is no focal stenosis. Calcifications are present at the right carotid bifurcation. There is focal tortuosity just beyond the bifurcation. There is no significant stenosis. Left carotid system: The left common carotid artery is within normal limits. Calcifications are present at the bifurcation without a significant stenosis. There is proximal tortuosity of the cervical left ICA without significant stenosis. The more distal cervical left ICA is normal. Vertebral arteries: The vertebral arteries originate from the subclavian arteries bilaterally. There is moderate stenosis with calcification at the origin of the left vertebral artery. There is no significant stenosis at the origin of the dominant right vertebral artery. Skeleton: Mild degenerative changes are present in the cervical spine without significant stenosis. Endplate changes are greatest at C6-7. Vertebral body heights are maintained. No focal lytic or blastic lesions are present. Other neck: The soft tissues the neck are otherwise unremarkable. No focal mucosal or submucosal lesions are present. The thyroid is normal. No significant adenopathy is present. Salivary glands are within normal limits. Upper chest: Centrilobular emphysematous changes are present. Mild dependent atelectasis is noted. There is no focal nodule or mass lesion. No significant airspace consolidation is present. The thoracic inlet is within normal limits. Review of the MIP images confirms the above findings CTA HEAD FINDINGS Anterior circulation: Atherosclerotic calcifications are present within the cavernous internal carotid arteries bilaterally. There is no significant stenosis relative to the more distal vessel on either side through the ICA termini. The A1 and M1 segments are normal. The anterior communicating artery is patent.  MCA bifurcations are intact. The ACA and MCA branch vessels are within normal limits. Posterior circulation: The right vertebral artery is slightly dominant to the left. PICA origins are visualized and normal. Vertebrobasilar junction is normal. The basilar artery is small, essentially terminating at the superior cerebellar arteries. Posterior cerebral arteries originate from the posterior communicating arteries bilaterally with small P1 segments. The PCA branch vessels demonstrate mild distal attenuation without a significant proximal stenosis or occlusion. Venous sinuses: The dural sinuses are patent. The right transverse sinus is dominant. The straight sinus and deep cerebral veins are intact. Cortical veins are within normal limits. Anatomic variants: Fetal type posterior cerebral arteries bilaterally. Review of the MIP images confirms the above findings IMPRESSION: 1. No emergent large vessel occlusion. 2. Atherosclerotic disease involving the aortic arch, carotid bifurcations, and cavernous internal carotid arteries without significant stenosis at these levels. 3. Moderate narrowing of the proximal left vertebral artery at the subclavian artery. 4. Fetal type posterior  cerebral arteries bilaterally with small P1 segments from the basilar tip. 5. Distal small vessel disease evident in the posterior circulation. These results were called by telephone at the time of interpretation on 12/09/2017 at 8:12 pm to Dr. Roland Rack , who verbally acknowledged these results. Electronically Signed   By: San Morelle M.D.   On: 12/09/2017 20:22   Dg Chest Port 1 View  Result Date: 12/09/2017 CLINICAL DATA:  Altered mental status.  CVA. EXAM: PORTABLE CHEST 1 VIEW COMPARISON:  None FINDINGS: UPPER limits normal heart size noted with pulmonary vascular congestion. There is no evidence of focal airspace disease, pulmonary edema, suspicious pulmonary nodule/mass, pleural effusion, or pneumothorax. No acute  bony abnormalities are identified. IMPRESSION: UPPER limits normal heart size with pulmonary vascular congestion. Electronically Signed   By: Margarette Canada M.D.   On: 12/09/2017 21:02   Ct Head Code Stroke Wo Contrast  Result Date: 12/09/2017 CLINICAL DATA:  Code stroke. Acute onset of left-sided facial weakness and facial droop. Last seen normal 4 hours ago. Focal neuro deficit, less than 6 hours, stroke suspected. EXAM: CT HEAD WITHOUT CONTRAST TECHNIQUE: Contiguous axial images were obtained from the base of the skull through the vertex without intravenous contrast. COMPARISON:  None. FINDINGS: Brain: No acute infarct, hemorrhage, or mass lesion is present. Mild periventricular white matter changes are noted. No other significant white matter disease is present. The brainstem and cerebellum are normal. Ventricles are of normal size. No significant extra-axial fluid collection is present. Vascular: Vascular calcifications are noted in the cavernous internal carotid arteries bilaterally. There is no hyperdense vessel. Skull: Calvarium is intact. No focal lytic or blastic lesions are present. Sinuses/Orbits: The paranasal sinuses and mastoid air cells are clear. Exophthalmos is noted bilaterally. Globes and orbits are otherwise within normal limits. ASPECTS [facility] Stroke Program Early CT Score) - Ganglionic level infarction (caudate, lentiform nuclei, internal capsule, insula, M1-M3 cortex): 7/7 - Supraganglionic infarction (M4-M6 cortex): 3/3 Total score (0-10 with 10 being normal): 10/10 IMPRESSION: 1. No acute intracranial abnormality. 2. Mild periventricular white matter hypoattenuation likely represents chronic microvascular disease. 3. Atherosclerosis. 4. ASPECTS is 10/10 The above was relayed via text pager to Dr. Leonel Ramsay on 12/09/2017 at 19:57 . Electronically Signed   By: San Morelle M.D.   On: 12/09/2017 20:07    Procedures Procedures (including critical care time)  CRITICAL  CARE Performed by: Merrily Pew Total critical care time: 35 minutes Critical care time was exclusive of separately billable procedures and treating other patients. Critical care was necessary to treat or prevent imminent or life-threatening deterioration. Critical care was time spent personally by me on the following activities: development of treatment plan with patient and/or surrogate as well as nursing, discussions with consultants, evaluation of patient's response to treatment, examination of patient, obtaining history from patient or surrogate, ordering and performing treatments and interventions, ordering and review of laboratory studies, ordering and review of radiographic studies, pulse oximetry and re-evaluation of patient's condition.   Medications Ordered in ED Medications  ipratropium-albuterol (DUONEB) 0.5-2.5 (3) MG/3ML nebulizer solution 3 mL (not administered)  methylPREDNISolone sodium succinate (SOLU-MEDROL) 125 mg/2 mL injection 60 mg (not administered)  budesonide (PULMICORT) nebulizer solution 0.5 mg (not administered)  arformoterol (BROVANA) nebulizer solution 15 mcg (not administered)  0.9 %  sodium chloride infusion (not administered)  heparin injection 5,000 Units (not administered)  vancomycin (VANCOCIN) 2,000 mg in sodium chloride 0.9 % 500 mL IVPB (not administered)  vancomycin (VANCOCIN) 1,250 mg in sodium chloride 0.9 %  250 mL IVPB (not administered)  piperacillin-tazobactam (ZOSYN) IVPB 3.375 g (not administered)  piperacillin-tazobactam (ZOSYN) IVPB 3.375 g (not administered)  naloxone (NARCAN) injection 0.4 mg (0.4 mg Intravenous Given 12/10/17 0007)  naloxone (NARCAN) 0.4 MG/ML injection (not administered)  albuterol (PROVENTIL) (2.5 MG/3ML) 0.083% nebulizer solution 2.5 mg (not administered)  pantoprazole (PROTONIX) injection 40 mg (not administered)  iopamidol (ISOVUE-370) 76 % injection 100 mL (100 mLs Intravenous Contrast Given 12/09/17 1957)      Initial Impression / Assessment and Plan / ED Course  I have reviewed the triage vital signs and the nursing notes.  Pertinent labs & imaging results that were available during my care of the patient were reviewed by me and considered in my medical decision making (see chart for details).     Code stroke activated in the field on arrival here still had residual deficits and altered mental status so taken to CT scanner. No indication for tPA secondary to unclear presentation  Persistently had altered mental status even on her oxygen.  Plan for admission to stepdown however concern over her protecting her airway so we will care consulted.  BiPAP started.  Final Clinical Impressions(s) / ED Diagnoses   Final diagnoses:  Acute on chronic respiratory failure with hypoxia and hypercapnia ([place])  Cerebrovascular accident (CVA), unspecified mechanism ([place])     Marquail Bradwell, Corene Cornea, MD 12/10/17 [id number]

## 2017-12-10 NOTE — Progress Notes (Signed)
Independently examined pt, evaluated data & formulated above care plan with NP/resident   62 year old hypertensive, COPD on 2 L oxygen admitted 2/23 with acute change in mental status, on Norco, Xanax and gabapentin at home Head CT and MRI negative for acute stroke but her oxygen had run out so some concern for hypoxia She is intubated for acute hypercarbic respiratory failure and poor mental status  On exam-sedated on low-dose fentanyl, wakes up and follows commands, S1-S2 normal, auto PEEP and respiratory rate increased to 28 on ventilator waveforms, 1+ bipedal edema, no JVD.  Chest x-ray personally reviewed, no new infiltrates, ET tube in position. Labs show acute respiratory acidosis, low BNP, negative pro-calcitonin a low lactate.  Impression/plan  Acute hypercarbic and hypoxic respiratory failure -she has been treated for recent COPD exacerbation with steroids, and does have bronchospasm. Continue IV Solu-Medrol 60 every 6 Continue Pulmicort and Brovana. Keep respiratory rate at 25 to avoid auto PEEP, ventilator settings were reviewed and adjusted, PEEP added to 8  Respiratory viral panel pending-continue empiric Zosyn but low threshold to decrease to Unasyn for aspiration pneumonitis, discontinue vancomycin.  Acute encephalopathy-now resolving, attributed to hypercarbia, doubt hypoxic injury Continue low-dose fentanyl, can add Precedex to avoid withdrawal from Xanax and Neurontin that she takes  The patient is critically ill with multiple organ systems failure and requires high complexity decision making for assessment and support, frequent evaluation and titration of therapies, application of advanced monitoring technologies and extensive interpretation of multiple databases. Critical Care Time devoted to patient care services described in this note independent of APP/resident  time is 35 minutes.    Leanna Sato [place]sworth Soho MD

## 2017-12-10 NOTE — Progress Notes (Signed)
Pharmacy Antibiotic Note  Theresa [name] is a 62 y.o. female admitted on 12/09/2017 with sepsis.  Pharmacy has been consulted for Vancocin and Zosyn dosing.  Of note pt w/ AKI, baseline SCr 0.7, now 1.7.  Plan: Vancomycin 2000x1 then 1251m IV every 24 hours.  Goal trough 15-20 mcg/mL. Zosyn 3.375g IV q8h (4 hour infusion).  Height: _0  (160 cm) Weight: 238 lb (108 kg) IBW/kg (Calculated) : 52.4  Temp (24hrs), Avg:97.9 F (36.6 C), Min:97.9 F (36.6 C), Max:97.9 F (36.6 C)  Recent Labs  Lab 12/09/17 1941 12/09/17 1954  WBC 16.9*  --   CREATININE 1.35* 1.40*    Estimated Creatinine Clearance: 50.3 mL/min (A) (by C-G formula based on SCr of 1.4 mg/dL (H)).    Allergies  Allergen Reactions  . Codeine Nausea And Vomiting  . Meloxicam Other (See Comments)    Stomach Pain     Thank you for allowing pharmacy to be a part of this patient's care.  VWynona Neat PharmD, BCPS  12/10/2017 12:00 AM

## 2017-12-10 NOTE — Progress Notes (Signed)
FPTS Social Note:   Came to visit Theresa [name]. Will follow along her course in the ICU. We will be more than happy to accept her on our service once she is stable for the floor.

## 2017-12-11 ENCOUNTER — Inpatient Hospital Stay (HOSPITAL_COMMUNITY): Payer: Medicare HMO

## 2017-12-11 ENCOUNTER — Telehealth: Payer: Self-pay | Admitting: Family Medicine

## 2017-12-11 ENCOUNTER — Other Ambulatory Visit: Payer: Self-pay | Admitting: Family Medicine

## 2017-12-11 DIAGNOSIS — J9601 Acute respiratory failure with hypoxia: Secondary | ICD-10-CM

## 2017-12-11 DIAGNOSIS — R4182 Altered mental status, unspecified: Secondary | ICD-10-CM

## 2017-12-11 DIAGNOSIS — J9602 Acute respiratory failure with hypercapnia: Secondary | ICD-10-CM

## 2017-12-11 DIAGNOSIS — R0902 Hypoxemia: Secondary | ICD-10-CM

## 2017-12-11 DIAGNOSIS — I161 Hypertensive emergency: Secondary | ICD-10-CM

## 2017-12-11 DIAGNOSIS — I10 Essential (primary) hypertension: Secondary | ICD-10-CM

## 2017-12-11 LAB — CBC
HCT: 45.2 % (ref 36.0–46.0)
Hemoglobin: 13.9 g/dL (ref 12.0–15.0)
MCH: 32.4 pg (ref 26.0–34.0)
MCHC: 30.8 g/dL (ref 30.0–36.0)
MCV: 105.4 fL — ABNORMAL HIGH (ref 78.0–100.0)
Platelets: 164 10*3/uL (ref 150–400)
RBC: 4.29 MIL/uL (ref 3.87–5.11)
RDW: 15.2 % (ref 11.5–15.5)
WBC: 11.6 10*3/uL — ABNORMAL HIGH (ref 4.0–10.5)

## 2017-12-11 LAB — BASIC METABOLIC PANEL
Anion gap: 14 (ref 5–15)
BUN: 25 mg/dL — ABNORMAL HIGH (ref 6–20)
CO2: 25 mmol/L (ref 22–32)
Calcium: 9.3 mg/dL (ref 8.9–10.3)
Chloride: 104 mmol/L (ref 101–111)
Creatinine, Ser: 0.9 mg/dL (ref 0.44–1.00)
GFR calc Af Amer: >60 mL/min (ref >60)
GFR calc non Af Amer: >60 mL/min (ref >60)
Glucose, Bld: 180 mg/dL — ABNORMAL HIGH (ref 65–99)
Potassium: 3.6 mmol/L (ref 3.5–5.1)
Sodium: 143 mmol/L (ref 135–145)

## 2017-12-11 LAB — GLUCOSE, CAPILLARY
Glucose-Capillary: 116 mg/dL — ABNORMAL HIGH (ref 65–99)
Glucose-Capillary: 125 mg/dL — ABNORMAL HIGH (ref 65–99)
Glucose-Capillary: 165 mg/dL — ABNORMAL HIGH (ref 65–99)
Glucose-Capillary: 174 mg/dL — ABNORMAL HIGH (ref 65–99)
Glucose-Capillary: 179 mg/dL — ABNORMAL HIGH (ref 65–99)

## 2017-12-11 LAB — ECHOCARDIOGRAM COMPLETE
Height: 63 in
Weight: 3823.66 oz

## 2017-12-11 LAB — PROCALCITONIN: Procalcitonin: <0.1 ng/mL

## 2017-12-11 MED ORDER — PANTOPRAZOLE SODIUM 40 MG PO TBEC: 40.0000 mg | DELAYED_RELEASE_TABLET | Freq: Every day | ORAL | Status: DC

## 2017-12-11 MED ORDER — INSULIN ASPART 100 UNIT/ML ~~LOC~~ SOLN
0.0000 [IU] | SUBCUTANEOUS | Status: DC
  Administered 2017-12-11 (×3): 2 [IU] via SUBCUTANEOUS

## 2017-12-11 MED ORDER — ORAL CARE MOUTH RINSE: 15.0000 mL | Freq: Two times a day (BID) | OROMUCOSAL | Status: DC

## 2017-12-11 MED ORDER — BISOPROLOL-HYDROCHLOROTHIAZIDE 10-6.25 MG PO TABS: 1.0000 | ORAL_TABLET | Freq: Every day | ORAL | 3 refills | Status: DC

## 2017-12-11 MED ORDER — PREDNISONE 20 MG PO TABS: 40.0000 mg | ORAL_TABLET | Freq: Every day | ORAL | Status: DC

## 2017-12-11 NOTE — Progress Notes (Signed)
FPTS Social Note:   Visited Theresa [name] this morning. Will follow along her course in the ICU. We will be more than happy to accept her on our service once she is stable for the floor.

## 2017-12-11 NOTE — Procedures (Signed)
Extubation Procedure Note  Patient Details:   Name: Theresa [name] DOB: [birth year] MRN: [id number]       Evaluation  O2 sats: stable throughout Complications: No apparent complications Patient did tolerate procedure well. Bilateral Breath Sounds: Expiratory wheezes, Diminished   Pt extubated per MD order.  Pt had +cuff leak. Placed on 6L [place] tolerating well, pt able to voice and has strong cough, RT will monitor   Ciro Backer 12/11/2017, 9:11 AM

## 2017-12-11 NOTE — Telephone Encounter (Signed)
Pt states she was in ED on 2/23 was intubated and admitted to ICU on 12/10/17.  Pt states she was being treated for something that she didn't have.  States she left AMA.    Pt states she needs RX refill of Bisoprolo 10-6.25 sent to [facility] in [place].

## 2017-12-11 NOTE — Progress Notes (Signed)
Pt requesting to leave AMA and husband at bedside to take pt home. Dr. Nelda Marseille and Dr. Andy Gauss discussed risks involved in leaving. Pt signed AMA form. Pt then sent in wheelchair/our O2 out to husband awaiting for her with her home O2 in vehicle.

## 2017-12-11 NOTE — Progress Notes (Signed)
Patient and son requested to speak with me.  The patient was extubated earlier today and is demanding to go home.  I asked about the "overdose" and she became very upset, informed me that she never had an overdose and that she ran out of O2 resulting in her CO2 increasing and her altered mental status.  This does not make sense physiologically but she is alert and oriented to self, place and time and states that she never wanted to hurt herself and that she has a safe place to be.    I do not believe patient was attempting to hurt herself as she denies OD and is concerned about her pain clinic knowing about an admission for OD.  She is competent in my judgement.  Will allow to sign AMA.  Rush Farmer, M.D. [facility] Pulmonary/Critical Care Medicine. Pager: [phone]. After hours pager: [phone].

## 2017-12-11 NOTE — Progress Notes (Signed)
  Echocardiogram 2D Echocardiogram has been performed.  Merrie Roof F 12/11/2017, 9:03 AM

## 2017-12-11 NOTE — Progress Notes (Addendum)
PULMONARY / CRITICAL CARE MEDICINE   Name: Theresa [name] MRN: [id number] DOB: May 29, 1956    ADMISSION DATE:  12/09/2017 CONSULTATION DATE:  12/10/2017  REFERRING MD:  Dr. Dayna Barker   CHIEF COMPLAINT:  AMS   HISTORY OF PRESENT ILLNESS:   62 year old female with PMH of CHF, HTN, COPD on 2.5L [place] at home, OSA/OHS with CPAP at HS, current Tobacco Dependence (1ppd for last 30-40 years), Depression, Chronic Back Pain on narcotics.  Presented to ED 2/23 with reports of AMS. Husband states that patient has had flu like symptoms for last 4-6 weeks, was placed on steroid taper and nebs. Due to steroids as had hard time sleeping. States for last 2 nights patient has not slept and has not worn CPAP. Today husband found patient on couch with empty oxygen canister. When EMS arrived oxygen saturation was 60-70%. Also reported that recently patient's friend past away and patient has been depressed. Unsure if she took additional xanax today. Upon arrival to ED GCS 3-8. Given Narcan with minimal to no effect. PCCM consulted.   ABG 7.1/88/82. On initial assessment patient awakens to physical stimulation. BiPAP placed.    SUBJECTIVE:  Patient is doing well this morning and ready for intubation.   VITAL SIGNS: BP (!) 151/84   Pulse 85   Temp 100.1 F (37.8 C) (Oral)   Resp (!) 23   Ht _0  (1.6 m)   Wt 238 lb 15.7 oz (108.4 kg)   SpO2 93%   BMI 42.33 kg/m   HEMODYNAMICS:    VENTILATOR SETTINGS: Vent Mode: PSV;CPAP FiO2 (%):  [45 %-60 %] 45 % Set Rate:  [25 bmp] 25 bmp Vt Set:  [450 mL] 450 mL PEEP:  [8 cmH20] 8 cmH20 Pressure Support:  [5 cmH20-10 cmH20] 5 cmH20 Plateau Pressure:  [17 cmH20-20 cmH20] 17 cmH20  INTAKE / OUTPUT: I/O last 3 completed shifts: In: 1111.9 [I.V.:436.9; IV Piggyback:675] Out: 2745 [Urine:2545; Emesis/NG output:200]  PHYSICAL EXAMINATION: General: obese female in NAD on vent HEENT: MM pink/moist, ETT Neuro: Awakens, follows commands, mild weakness on L  (baseline) CV: s1s2 rrr, no m/r/g PULM: even/non-labored, lungs bilaterally coarse with wheezing bilaterally  PJ:ASNK, non-tender, bsx4 active  Extremities: warm/dry, no edema  Skin: no rashes or lesions  LABS:  BMET Recent Labs  Lab 12/09/17 1941 12/09/17 1954 12/10/17 0123 12/11/17 0427  NA 139 140 143 143  K 5.9* 5.6* 4.5 3.6  CL 103 106 102 104  CO2 23  --  29 25  BUN 31* 34* 26* 25*  CREATININE 1.35* 1.40* 1.04* 0.90  GLUCOSE 140* 137* 124* 180*    Electrolytes Recent Labs  Lab 12/09/17 1941 12/10/17 0123 12/11/17 0427  CALCIUM 8.7* 8.5* 9.3  MG  --  2.5*  --   PHOS  --  5.4*  --     CBC Recent Labs  Lab 12/09/17 1941 12/09/17 1954 12/10/17 0123 12/11/17 0427  WBC 16.9*  --  13.3* 11.6*  HGB 15.1* 16.3* 14.4 13.9  HCT 49.6* 48.0* 47.3* 45.2  PLT 191  --  171 164    Coag's Recent Labs  Lab 12/09/17 1941  APTT 36  INR 1.04    Sepsis Markers Recent Labs  Lab 12/10/17 0123 12/11/17 0427  LATICACIDVEN 0.9  --   PROCALCITON <0.10 <0.10    ABG Recent Labs  Lab 12/10/17 0213 12/10/17 0349 12/10/17 0546  PHART 7.252* 7.231* 7.279*  PCO2ART 73.7* 74.6* 63.1*  PO2ART 80.0* 69.0* 55.0*    Liver  Enzymes Recent Labs  Lab 12/09/17 1941  AST 24  ALT 21  ALKPHOS 87  BILITOT 0.4  ALBUMIN 3.8    Cardiac Enzymes No results for input(s): TROPONINI, PROBNP in the last 168 hours.  Glucose Recent Labs  Lab 12/10/17 1216 12/10/17 1649 12/10/17 1926 12/11/17 0022 12/11/17 0406 12/11/17 0815  GLUCAP 161* 161* 141* 174* 165* 179*    Imaging Portable Chest Xray  Result Date: 12/11/2017 CLINICAL DATA:  Acute respiratory failure EXAM: PORTABLE CHEST 1 VIEW COMPARISON:  Yesterday FINDINGS: Endotracheal tube tip just below the clavicular heads. An orogastric tube is coiled in the stomach. Cardiomegaly and vascular pedicle widening. Increased hazy opacity at the bases with possible air bronchograms on the left. No Kerley lines. No  pneumothorax. IMPRESSION: 1. Stable positioning of endotracheal and orogastric tubes. 2. Worsening aeration at the bases that could be infection or atelectasis. Electronically Signed   By: Monte Fantasia M.D.   On: 12/11/2017 07:10     STUDIES:  CT Head 2/23 > No acute intracranial abnormality.  Mild periventricular white matter hypoattenuation likely represents chronic microvascular disease.  Atherosclerosis.  CTA Head 2/23 > No emergent large vessel occlusion. Atherosclerotic disease involving the aortic arch, carotid bifurcations, and cavernous internal carotid arteries without significant stenosis at these levels.  Moderate narrowing of the proximal left vertebral artery at the subclavian artery.  Fetal type posterior cerebral arteries bilaterally with small P1 segments from the basilar tip.  Distal small vessel disease evident in the posterior circulation. MRI Brain 2/24 >> no acute intracranial process, similar T2 bright signal bilateral globus pallidus seen with prior metabolic or toxic insult, moderate chronic small vessel ischemic disease ECHO 2/25 >>   CULTURES: U/A 2/24 > Negative  Sputum 2/24 >> Blood 2/24 >> RVP 2/24 >>nefgative   ANTIBIOTICS: Vancomycin 2/24 >> 2/24 Zosyn 2/24 >>   SIGNIFICANT EVENTS: 2/23  Admitted with AMS, hypercarbic respiratory failure > tx to ICU  LINES/TUBES: PIV   DISCUSSION: 62 year old female with COPD and recent flu like symptoms, presents to ED with AMS. CO2 88.1. Placed on BiPAP and transferred to ICU.    ASSESSMENT / PLAN:  PULMONARY A: Acute on Chronic Hypoxic/Hypercapnic Respiratory failure in setting of AECOPD secondary to Flu vs CAP  Respiratory Acidosis -improved H/O COPD, OHS/OSA on CPAP at HS, Tobacco Dependence  P:   Pressure support, SBT and extubation Daily SBT / WUA Continue Brovana + Pulmicort  Continue Duoneb Q6 for now with bronchospasm PRN albuterol  Solumedrol 60 mg IV Q6  CXR while intubated Will need CPAP QHS  post extubation  Smoking cessation counseling when able   CARDIOVASCULAR A:  Congestive Heart Failure (documented no previous echo in EMR)  H/O HTN  P:  ICU monitoring  Follow up on ECHO  MAP goal > 65   RENAL A:   Hyperkalemia-resolved  Back to baseline creatinine  P:   Trend BMP / urinary output Replace electrolytes as indicated Avoid nephrotoxic agents, ensure adequate renal perfusion  GASTROINTESTINAL A:   GERD Morbid Obesity  P:   Will resume diet after extubation Pepcid BID PT If not extubated 2/24, will need TF 2/25  HEMATOLOGIC A:   VTE Prophylaxis  P:  Trend CBC  Heparin for DVT prophylaxis   INFECTIOUS A:   Suspected Flu vs CAP. PCT <0.10. Culture pending P:   Trend CBC, fever curve  Follow cultures as above  Empiric abx as above, discontinue vancomycin   ENDOCRINE A:   No  issues    P:   Monitor glucose on BMP  NEUROLOGIC A:   Encephalopathy in setting of hypercarbia - UDS positive for opiates  Uncertain if patient took additional xanax, also takes norco at home   H/O Depression, Chronic Back Pain     P:   RASS Goal:  0 to -1  Minimize sedation as able  Discontinue IV fentanyl gtt > transition to PRN  PRN versed for sedation On precedex Appreciate Neurology input > doubt acute neurological event   FAMILY  - Updates:  No family at bedside.  Husband had stepped out of unit.  Will update on arrival.   - Inter-disciplinary family meet or Palliative Care meeting due by: 12/17/2017   Marjie Skiff, MD [place], PGY-2  12/11/2017, 8:44 AM  Attending Note:  62 year old female with COPD history who presents to the hospital with respiratory failure with concern for over sedation.  Patient denies intention to hurt self.  On exam, she is weaning well with clear lungs.  I reviewed CXR myself, ETT is in good position.  Discussed with PCCM-NP and FPTS.  Will proceed with extubation today.  Continue bronchodilators.   Prednisone PO.  Hold in the ICU for observation this afternoon can move out.   The patient is critically ill with multiple organ systems failure and requires high complexity decision making for assessment and support, frequent evaluation and titration of therapies, application of advanced monitoring technologies and extensive interpretation of multiple databases.   Critical Care Time devoted to patient care services described in this note is  35  Minutes. This time reflects time of care of this signee Dr Jennet Maduro. This critical care time does not reflect procedure time, or teaching time or supervisory time of PA/NP/Med student/Med Resident etc but could involve care discussion time.  Rush Farmer, M.D. [facility] Pulmonary/Critical Care Medicine. Pager: [phone]. After hours pager: [phone].

## 2017-12-11 NOTE — Progress Notes (Signed)
[place] Progress Note Patient Name: Theresa [name] DOB: 07-06-1956 MRN: [id number]   Date of Service  12/11/2017  HPI/Events of Note  Blood glucose = 174.   eICU Interventions  Will order: 1. Q 4 hour sensitive Novolog SSI.      Intervention Category Major Interventions: Hyperglycemia - active titration of insulin therapy  Sommer,Steven Eugene 12/11/2017, 1:03 AM

## 2017-12-11 NOTE — Progress Notes (Signed)
I was reassessing patient after extubation earlier this morning, when she inform me that her husband was coming to take her home. I made the patient aware that she was going to be transferred out of the ICU to telemetry and be pick up in the morning by the [facility] medicine teaching service. Patient was not interested in staying for continued care. I discuss the risks of leaving AMA after she was taken out of life support this morning. Patient was competent and verbalized understanding but decided to not follow medical advice to remain inpatient for further monitoring of her respiratory status. Husband was present during conversation and was in agreement with patient about leaving AMA. Dr. Nelda Marseille, ICU attending was made aware, he talked to the patient also reiterated the risks of leaving [place] after she presented with altered mental status requiring intubation. Patient was unwilling to change her decisions. AMA papers was signed by patient, and she was wheeled out the unit by nursing stuff.  Marjie Skiff, MD [place], PGY-2

## 2017-12-12 ENCOUNTER — Other Ambulatory Visit: Payer: Self-pay

## 2017-12-12 ENCOUNTER — Other Ambulatory Visit: Payer: Self-pay | Admitting: Family Medicine

## 2017-12-12 ENCOUNTER — Encounter: Payer: Self-pay | Admitting: Family Medicine

## 2017-12-12 ENCOUNTER — Ambulatory Visit: Payer: Medicare HMO | Admitting: Pain Medicine

## 2017-12-12 DIAGNOSIS — J40 Bronchitis, not specified as acute or chronic: Principal | ICD-10-CM

## 2017-12-12 DIAGNOSIS — B9689 Other specified bacterial agents as the cause of diseases classified elsewhere: Secondary | ICD-10-CM

## 2017-12-12 LAB — CULTURE, RESPIRATORY W GRAM STAIN

## 2017-12-12 MED ORDER — AMOXICILLIN-POT CLAVULANATE 875-125 MG PO TABS: 1.0000 | ORAL_TABLET | Freq: Two times a day (BID) | ORAL | 0 refills | Status: DC

## 2017-12-12 NOTE — Progress Notes (Deleted)
Patient's Name: Theresa [name]  MRN: [id number]  Referring Provider: Milinda Pointer, MD  DOB: 10/26/1955  PCP: Margo Common, PA  DOS: 12/12/2017  Note by: Gaspar Cola, MD  Service setting: Ambulatory outpatient  Specialty: Interventional Pain Management  Patient type: Established  Location: [facility] (AMB) Pain Management Facility  Visit type: Interventional Procedure   Primary Reason for Visit: Interventional Pain Management Treatment. CC: No chief complaint on file.  Procedure:  Anesthesia, Analgesia, Anxiolysis:  Procedure #1: Type: Therapeutic Inter-Laminar Epidural Steroid Injection  #1  Region: Lumbar Level: L4-5 Level. Laterality: Left Paramedial  Procedure #2: Type: Therapeutic Trans-Foraminal Epidural Steroid Injection  #1  Region: Lumbar Level: L4 Paravertebral Laterality: Left Paravertebral Position: Prone  Type: Local Anesthesia with Moderate (Conscious) Sedation Local Anesthetic: Lidocaine 1% Route: Intravenous (IV) IV Access: Secured Sedation: Meaningful verbal contact was maintained at all times during the procedure  Indication(s): Analgesia and Anxiety   Indications: 1. DDD (degenerative disc disease), lumbar   2. Chronic lumbar radicular pain (L4 & S1 Dermatome) (Left)   3. Chronic lower extremity pain (Secondary Area of Pain) (Left)   4. Spinal stenosis of lumbar region with neurogenic claudication   5. Chronic low back pain (Primary Area of Pain) (Left)    Pain Score: Pre-procedure:  /10 Post-procedure:  /10  Pre-op Assessment:  Theresa [name] is a 62 y.o. (year old), female patient, seen today for interventional treatment. She  has a past surgical history that includes Tubal ligation; Abdominal hysterectomy; Tonsillectomy; and Carpal tunnel release (Left). Theresa [name] has a current medication list which includes the following prescription(s): albuterol, alprazolam, aspirin-caffeine, bisoprolol-hydrochlorothiazide, cholecalciferol, clotrimazole,  fluticasone-salmeterol, furosemide, gabapentin, hydrocodone-acetaminophen, ipratropium-albuterol, ketoconazole, oxygen-helium, potassium chloride sa, ranitidine, and sertraline. Her primarily concern today is the No chief complaint on file.  Initial Vital Signs:  Pulse Rate:   Temp:   Resp:   BP:   SpO2:    BMI: Estimated body mass index is 42.16 kg/m as calculated from the following:   Height as of 11/27/17: _0  (1.6 m).   Weight as of 12/04/17: 238 lb (108 kg).  Risk Assessment: Allergies: Reviewed. She is allergic to codeine and meloxicam.  Allergy Precautions: None required Coagulopathies: Reviewed. None identified.  Blood-thinner therapy: None at this time Active Infection(s): Reviewed. None identified. Theresa [name] is afebrile  Site Confirmation: Theresa [name] was asked to confirm the procedure and laterality before marking the site Procedure checklist: Completed Consent: Before the procedure and under the influence of no sedative(s), amnesic(s), or anxiolytics, the patient was informed of the treatment options, risks and possible complications. To fulfill our ethical and legal obligations, as recommended by the American Medical Association's Code of Ethics, I have informed the patient of my clinical impression; the nature and purpose of the treatment or procedure; the risks, benefits, and possible complications of the intervention; the alternatives, including doing nothing; the risk(s) and benefit(s) of the alternative treatment(s) or procedure(s); and the risk(s) and benefit(s) of doing nothing. The patient was provided information about the general risks and possible complications associated with the procedure. These may include, but are not limited to: failure to achieve desired goals, infection, bleeding, organ or nerve damage, allergic reactions, paralysis, and death. In addition, the patient was informed of those risks and complications associated to Spine-related procedures, such  as failure to decrease pain; infection (i.e.: Meningitis, epidural or intraspinal abscess); bleeding (i.e.: epidural hematoma, subarachnoid hemorrhage, or any other type of intraspinal or peri-dural bleeding); organ or nerve damage (i.e.: Any  type of peripheral nerve, nerve root, or spinal cord injury) with subsequent damage to sensory, motor, and/or autonomic systems, resulting in permanent pain, numbness, and/or weakness of one or several areas of the body; allergic reactions; (i.e.: anaphylactic reaction); and/or death. Furthermore, the patient was informed of those risks and complications associated with the medications. These include, but are not limited to: allergic reactions (i.e.: anaphylactic or anaphylactoid reaction(s)); adrenal axis suppression; blood sugar elevation that in diabetics may result in ketoacidosis or comma; water retention that in patients with history of congestive heart failure may result in shortness of breath, pulmonary edema, and decompensation with resultant heart failure; weight gain; swelling or edema; medication-induced neural toxicity; particulate matter embolism and blood vessel occlusion with resultant organ, and/or nervous system infarction; and/or aseptic necrosis of one or more joints. Finally, the patient was informed that Medicine is not an exact science; therefore, there is also the possibility of unforeseen or unpredictable risks and/or possible complications that may result in a catastrophic outcome. The patient indicated having understood very clearly. We have given the patient no guarantees and we have made no promises. Enough time was given to the patient to ask questions, all of which were answered to the patient's satisfaction. Ms. Kuck has indicated that she wanted to continue with the procedure. Attestation: I, the ordering provider, attest that I have discussed with the patient the benefits, risks, side-effects, alternatives, likelihood of achieving goals,  and potential problems during recovery for the procedure that I have provided informed consent. Date  Time: {CHL [facility]-PAIN TIME CHOICES:[id number]}  Pre-Procedure Preparation:  Monitoring: As per clinic protocol. Respiration, ETCO2, SpO2, BP, heart rate and rhythm monitor placed and checked for adequate function Safety Precautions: Patient was assessed for positional comfort and pressure points before starting the procedure. Time-out: I initiated and conducted the "Time-out" before starting the procedure, as per protocol. The patient was asked to participate by confirming the accuracy of the "Time Out" information. Verification of the correct person, site, and procedure were performed and confirmed by me, the nursing staff, and the patient. "Time-out" conducted as per Joint Commission's Universal Protocol (UP.01.01.01). Time:    Description of Procedure #1 Process:   Target Area: The  interlaminar space, initially targeting the lower border of the superior vertebral body lamina. Approach: Posterior paramedial approach. Area Prepped: Entire Posterior Lumbosacral Region Prepping solution: ChloraPrep (2% chlorhexidine gluconate and 70% isopropyl alcohol) Safety Precautions: Aspiration looking for blood return was conducted prior to all injections. At no point did we inject any substances, as a needle was being advanced. No attempts were made at seeking any paresthesias. Safe injection practices and needle disposal techniques used. Medications properly checked for expiration dates. SDV (single dose vial) medications used. Description of the Procedure: Protocol guidelines were followed. The patient was placed in position over the fluoroscopy table. The target area was identified and the area prepped in the usual manner. Skin desensitized using vapocoolant spray. Skin & deeper tissues infiltrated with local anesthetic. Appropriate amount of time allowed to pass for local anesthetics to take effect. The  procedure needle was introduced through the skin, ipsilateral to the reported pain, and advanced to the target area. Bone was contacted and the needle walked caudad, until the lamina was cleared. The ligamentum flavum was engaged and loss-of-resistance technique used as the epidural needle was advanced. The epidural space was identified using "loss-of-resistance technique" with 2-3 ml of PF-NaCl (0.9% NSS), in a 5cc LOR glass syringe. Proper needle placement secured. Negative aspiration confirmed.  Solution injected in intermittent fashion, asking for systemic symptoms every 0.5cc of injectate. The needles were then removed and the area cleansed, making sure to leave some of the prepping solution back to take advantage of its long term bactericidal properties. Start Time:   hrs. Materials:  Needle(s) Type: Epidural needle Gauge: 17G Length: 3.5-in Medication(s): Please see orders for medications and dosing details.  Description of Procedure #2 Process:   Target Area: The inferior and lateral portion of the pedicle, just lateral to a line created by the 6:00 position of the pedicle and the superior articular process of the vertebral body below. On the lateral view, this target lies just posterior to the anterior aspect of the lamina and posterior to the midpoint created between the anterior and the posterior aspect of the neural foramina. Approach: Posterior paravertebral approach. Area Prepped: Same as above Prepping solution: Same as above Safety Precautions: Same as above Description of the Procedure: Protocol guidelines were followed. The patient was placed in position over the fluoroscopy table. The target area was identified and the area prepped in the usual manner. Skin desensitized using vapocoolant spray. Skin & deeper tissues infiltrated with local anesthetic. Appropriate amount of time allowed to pass for local anesthetics to take effect. The procedure needles were then advanced to the target  area. Proper needle placement secured. Negative aspiration confirmed. Solution injected in intermittent fashion, asking for systemic symptoms every 0.2cc of injectate. The needles were then removed and the area cleansed, making sure to leave some of the prepping solution back to take advantage of its long term bactericidal properties. There were no vitals filed for this visit.  End Time:   hrs. Materials:  Needle(s) Type: Regular needle Gauge: 22G Length: 3.5-in Medication(s): Please see orders for medications and dosing details.  Imaging Guidance (Spinal):  Type of Imaging Technique: Fluoroscopy Guidance (Spinal) Indication(s): Assistance in needle guidance and placement for procedures requiring needle placement in or near specific anatomical locations not easily accessible without such assistance. Exposure Time: Please see nurses notes. Contrast: Before injecting any contrast, we confirmed that the patient did not have an allergy to iodine, shellfish, or radiological contrast. Once satisfactory needle placement was completed at the desired level, radiological contrast was injected. Contrast injected under live fluoroscopy. No contrast complications. See chart for type and volume of contrast used. Fluoroscopic Guidance: I was personally present during the use of fluoroscopy. "Tunnel Vision Technique" used to obtain the best possible view of the target area. Parallax error corrected before commencing the procedure. "Direction-depth-direction" technique used to introduce the needle under continuous pulsed fluoroscopy. Once target was reached, antero-posterior, oblique, and lateral fluoroscopic projection used confirm needle placement in all planes. Images permanently stored in EMR. Interpretation: I personally interpreted the imaging intraoperatively. Adequate needle placement confirmed in multiple planes. Appropriate spread of contrast into desired area was observed. No evidence of afferent or  efferent intravascular uptake. No intrathecal or subarachnoid spread observed. Permanent images saved into the patient's record.  Antibiotic Prophylaxis:   Anti-infectives (From admission, onward)   None     Indication(s): None identified  Post-operative Assessment:  Post-procedure Vital Signs:  Pulse Rate:   Temp:   Resp:   BP:   SpO2:    EBL: None  Complications: No immediate post-treatment complications observed by team, or reported by patient.  Note: The patient tolerated the entire procedure well. A repeat set of vitals were taken after the procedure and the patient was kept under observation following institutional policy, for  this type of procedure. Post-procedural neurological assessment was performed, showing return to baseline, prior to discharge. The patient was provided with post-procedure discharge instructions, including a section on how to identify potential problems. Should any problems arise concerning this procedure, the patient was given instructions to immediately contact us, at any time, without hesitation. In any case, we plan to contact the patient by telephone for a follow-up status report regarding this interventional procedure.  Comments:  No additional relevant information.  Plan of Care   Imaging Orders  No imaging studies ordered today   Procedure Orders    No procedure(s) ordered today    Medications ordered for procedure: No orders of the defined types were placed in this encounter.  Medications administered: [name] had no medications administered during this visit.  See the medical record for exact dosing, route, and time of administration.  New Prescriptions   No medications on file   Disposition: Discharge home  Discharge Date & Time: 12/12/2017;   hrs.   Physician-requested Follow-up: No Follow-up on file.  Future Appointments  Date Time Provider [place]  12/12/2017  1:00 PM Milinda Pointer, MD [facility] None   02/01/2018  2:30 PM Vevelyn Francois, NP [facility] None  03/14/2018  1:20 PM [name], FNP [facility] None  04/10/2018  3:00 PM Harlin Heys, MD [facility] None   Primary Care Physician: Margo Common, PA Location: [facility] Out[facility] Management Facility Note by: Gaspar Cola, MD Date: 12/12/2017; Time: 8:53 AM  Disclaimer:  Medicine is not an Chief Strategy Officer. The only guarantee in medicine is that nothing is guaranteed. It is important to note that the decision to proceed with this intervention was based on the information collected from the patient. The Data and conclusions were drawn from the patient's questionnaire, the interview, and the physical examination. Because the information was provided in large part by the patient, it cannot be guaranteed that it has not been purposely or unconsciously manipulated. Every effort has been made to obtain as much relevant data as possible for this evaluation. It is important to note that the conclusions that lead to this procedure are derived in large part from the available data. Always take into account that the treatment will also be dependent on availability of resources and existing treatment guidelines, considered by other Pain Management Practitioners as being common knowledge and practice, at the time of the intervention. For Medico-Legal purposes, it is also important to point out that variation in procedural techniques and pharmacological choices are the acceptable norm. The indications, contraindications, technique, and results of the above procedure should only be interpreted and judged by a Board-Certified Interventional Pain Specialist with extensive familiarity and expertise in the same exact procedure and technique.

## 2017-12-12 NOTE — Patient Outreach (Addendum)
[facility]) Care Management  12/12/2017  Theresa [name] 05/20/1956 [id number]   Telephone call to patient for transition of care follow up.  Spoke with patient.  She is able to verify HIPAA. Patient reports she did go to the hospital but signed herself out.  Patient states she felt like she was not treated right but did not go into detail.  Patient states she will be calling for formal compliant.  Offered to assist but she declined.  Patient verifies that Dr. Natale Milch is her primary care physician and that she has follow up appointment along with transportation.  Discussed [facility] Care Management Services.  Patient declined [place] Management services  but thankful for call but needs to end call before completing the assessment.  Plan: RN CM will send letter and brochure.   RN CM will close case and notify care management assistant of status.  Jone Baseman, RN, MSN [facility] Care Management Care Management Coordinator Direct Line [phone] Toll Free: [phone]  Fax: [phone]

## 2017-12-12 NOTE — Progress Notes (Unsigned)
[place]

## 2017-12-14 NOTE — Discharge Summary (Signed)
[facility] Discharge Summary  Patient name: Theresa [name] Medical record number: [id number] Date of birth: 1955/11/12 Age: 62 y.o. Gender: female Date of Admission: 12/09/2017       Date of Discharge: 12/11/2017 (LEFT AMA) Admitting Physician: Reyne Dumas, MD  Primary Care Provider: Margo Common, PA Consultants: [facility], ICU  Indication for Hospitalization: Hypercarbic Hypoxic respiratory failure  Disposition: LEFT AMA  Discharge Condition: Stable,  Discharge Exam: General: obese female in NAD off the vent HEENT: MM pink/moist Neuro: Awakens, follows commands,  CV: s1s2 rrr, no m/r/g PULM: even/non-labored, lungs bilaterally coarse  IB:BCWU, non-tender, bsx4 active  Extremities: warm/dry, no edema  Skin: no rashes or lesions   Brief Hospital Course:  Presented to ED 2/23 with reports of AMS. Husband states that patient has had flu like symptoms for last 4-6 weeks, was placed on steroid taper and nebs. Due to steroids as had hard time sleeping. Husband states for two nights prior to admission patient had not slept and did not wear CPAP. On admission day, husband found patient on couch with empty oxygen canister. When EMS arrived oxygen saturation was 60-70%. Also reported that recently patient's friend past away and patient has been depressed. Unsure if she took additional xanax today. Upon arrival to ED GCS 3-8. Given Narcan with minimal to no effect. Patient was intubated and place on vent support. Patient additional received steroids and breathing therapy which she tolerated well. Patient also received antibiotics initially for possible pneumonia. Patient status improved on ventilator and she was extubated on 2/25. Patient was going to be transferred to telemetry for management when she decided to leave against medical advice.   Issues for Follow Up:  Left AMA  Significant Procedures: On mechanical  ventilation   Marjie Skiff,  MD 12/14/2017, 10:35 AM PGY-2, [place]

## 2017-12-15 ENCOUNTER — Telehealth: Payer: Self-pay

## 2017-12-15 LAB — CULTURE, BLOOD (ROUTINE X 2)
Culture: NO GROWTH
Culture: NO GROWTH
Special Requests: ADEQUATE
Special Requests: ADEQUATE

## 2017-12-15 NOTE — Telephone Encounter (Signed)
-----  Message from [place], [place] sent at 12/15/2017 12:19 PM EST ----- Final report of blood cultures were negative for any infection in blood stream. Only Moraxella tracheal/lung infection. Take all the antibiotic given and recheck CBC with diff in 1 week. Sooner, if needed.

## 2017-12-15 NOTE — Telephone Encounter (Signed)
Patient advised. 1 week follow up scheduled.

## 2017-12-20 DIAGNOSIS — J9610 Chronic respiratory failure, unspecified whether with hypoxia or hypercapnia: Secondary | ICD-10-CM | POA: Diagnosis not present

## 2017-12-21 IMAGING — US US BREAST*L* LIMITED INC AXILLA
1 series · 3 of 3 positions shown · non-contrast
Comparison: Previous exam(s).

CLINICAL DATA: Screening recall for a possible left breast mass.

EXAM:
2D DIGITAL DIAGNOSTIC UNILATERAL LEFT MAMMOGRAM WITH CAD AND ADJUNCT
TOMO
LEFT BREAST ULTRASOUND

[Series 1: us breast*left* limited inc axilla · 0.06mm/px · 3 of 3 slices shown]
[im 1/3]
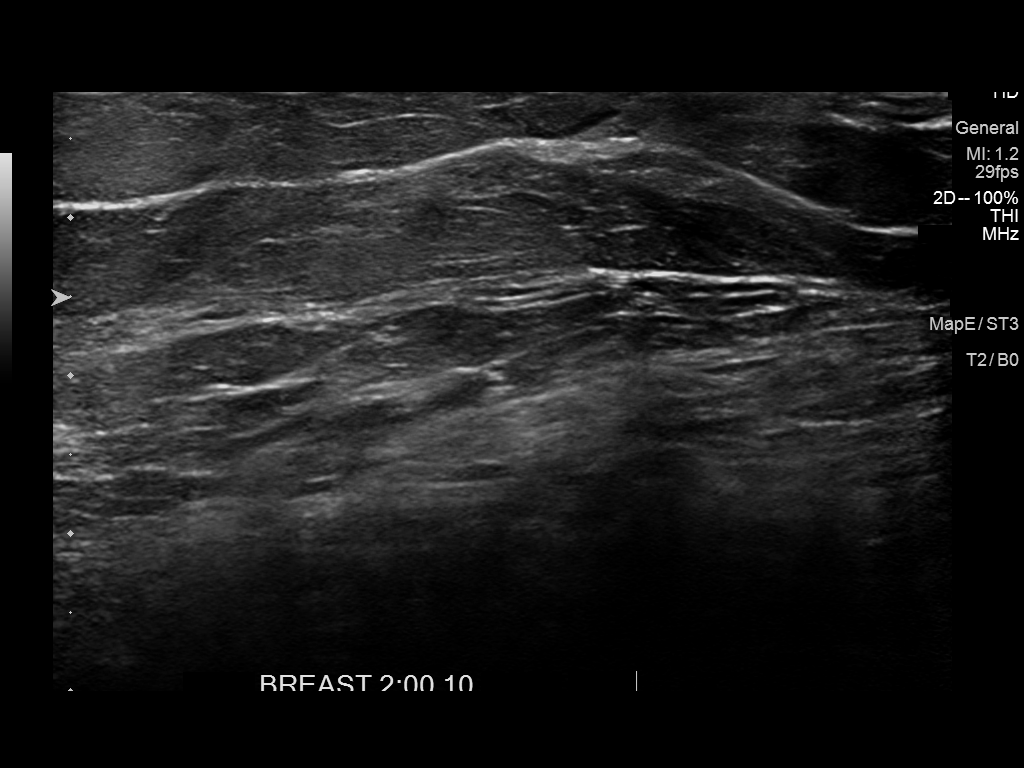
[im 2/3]
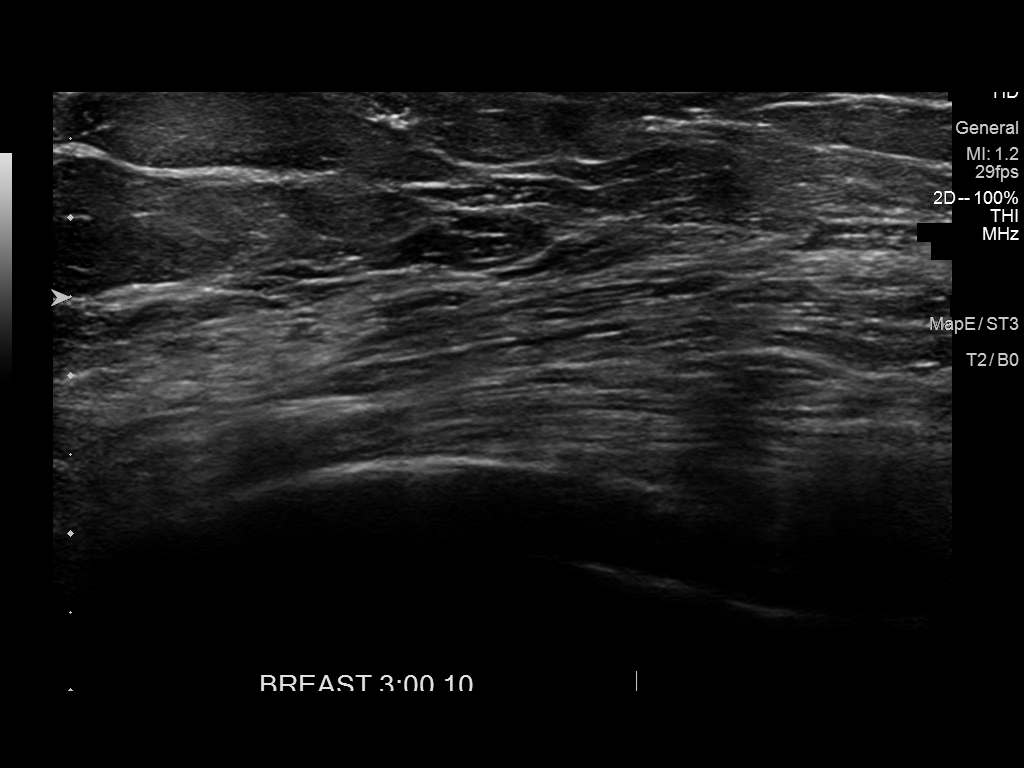
[im 3/3]
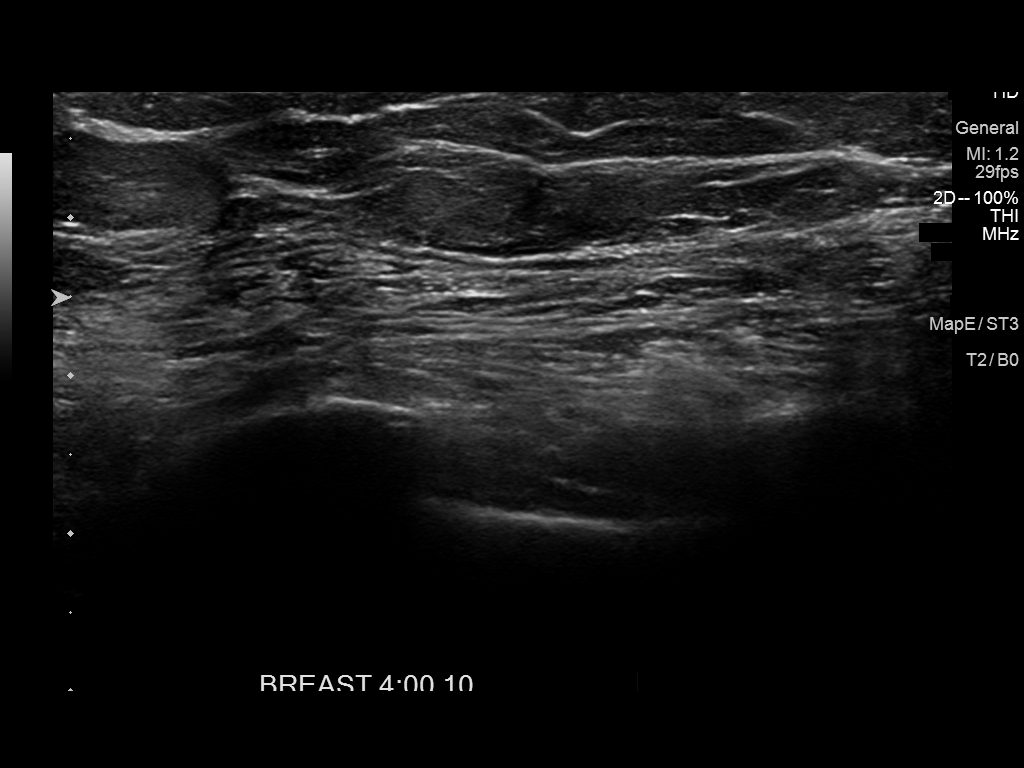

[3 of 3 positions shown; findings below may reference images not displayed]

ACR Breast Density Category b: There are scattered areas of
fibroglandular density.
FINDINGS: Tomosynthesis images of the lateral aspect of the left breast
demonstrate no persistent suspicious masses or asymmetries.

Mammographic images were processed with CAD.

Physical exam of the lateral aspect of the left breast demonstrates
normal fibroglandular tissue. No masses or suspicious areas of
shadowing are identified.
IMPRESSION: There are no mammographic or targeted sonographic abnormalities in
the lateral aspect of the left breast. No mammographic evidence of
malignancy in the left breast.

RECOMMENDATION:
Screening mammogram in one year.(Code:L8-4-518)

I have discussed the findings and recommendations with the patient.
Results were also provided in writing at the conclusion of the
visit. If applicable, a reminder letter will be sent to the patient
regarding the next appointment.

BI-RADS CATEGORY  1: Negative.

## 2017-12-21 IMAGING — MG MM DIGITAL DIAGNOSTIC UNILAT*L* W/ TOMO W/ CAD
8 of 13 series · 8 of 25 positions shown · non-contrast
Comparison: Previous exam(s).

CLINICAL DATA: Screening recall for a possible left breast mass.

EXAM:
2D DIGITAL DIAGNOSTIC UNILATERAL LEFT MAMMOGRAM WITH CAD AND ADJUNCT
TOMO
LEFT BREAST ULTRASOUND

[L MLO (1 of 2)]
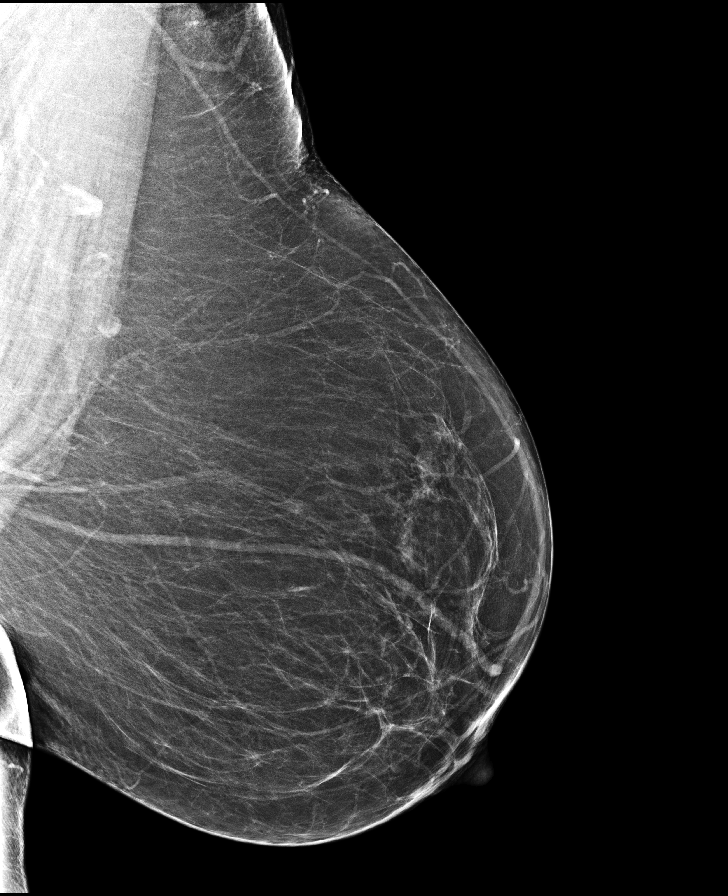

[L MLO synth-2D]
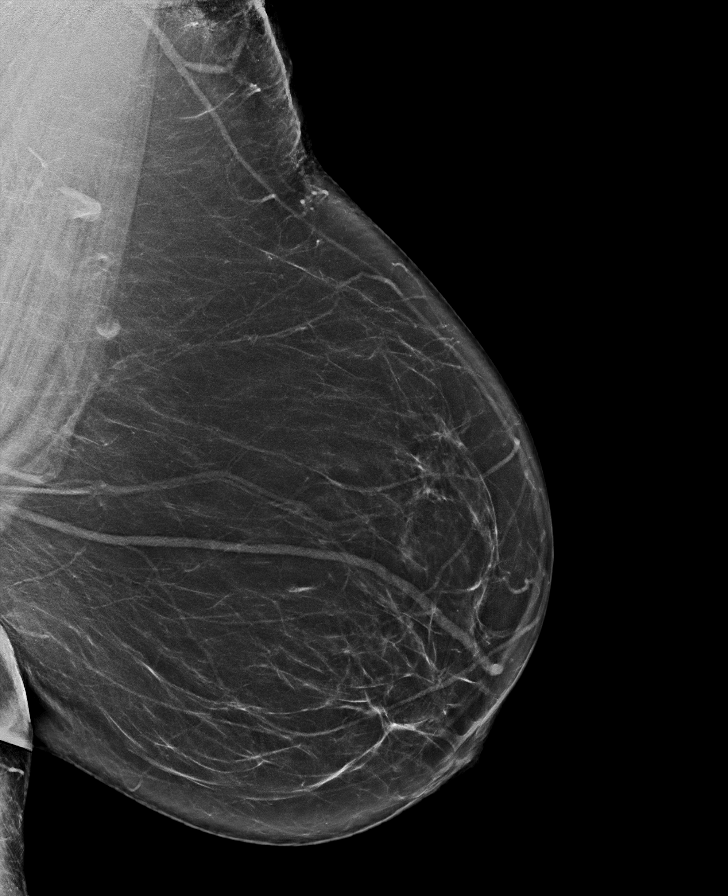

[L CC (1 of 3)]
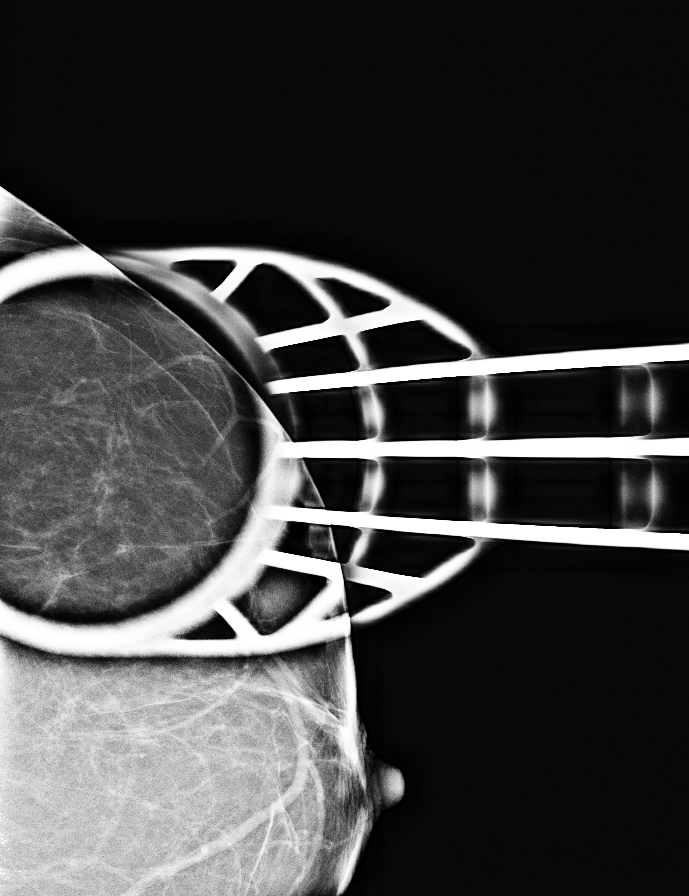

[L CC (2 of 3)]
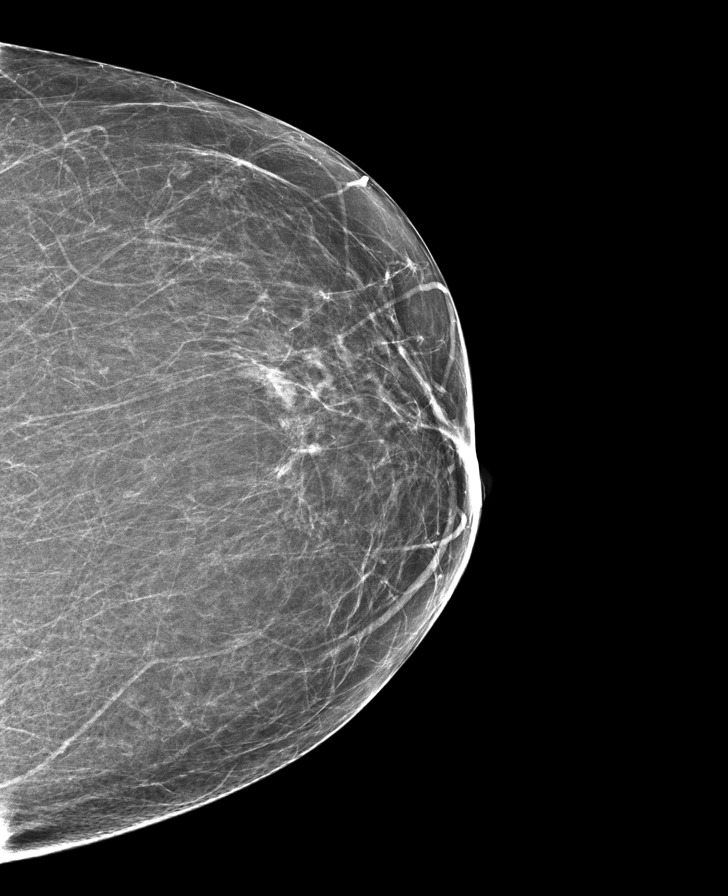

[L CC synth-2D (1 of 2)]
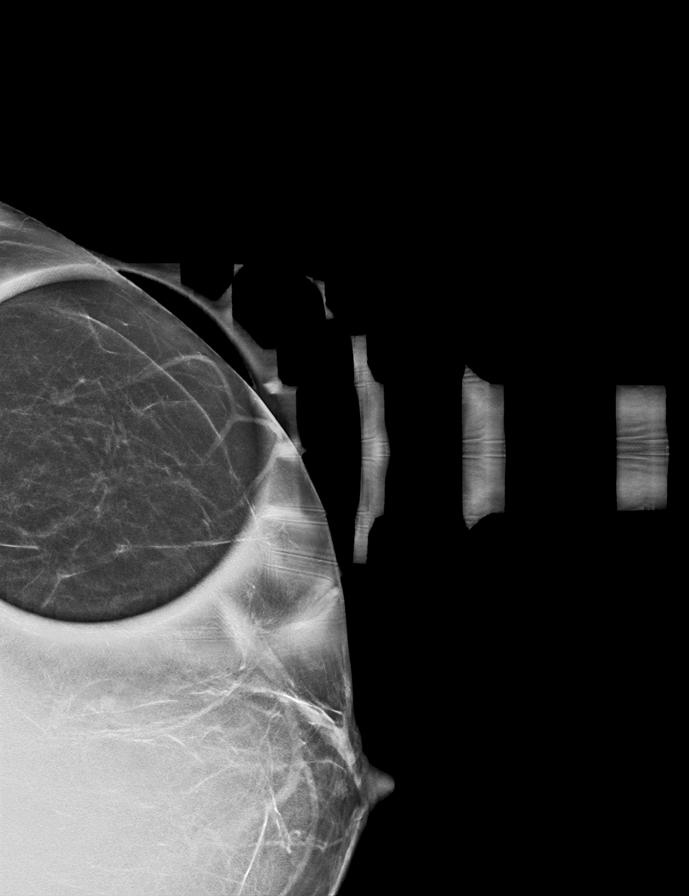

[L CC synth-2D (2 of 2)]
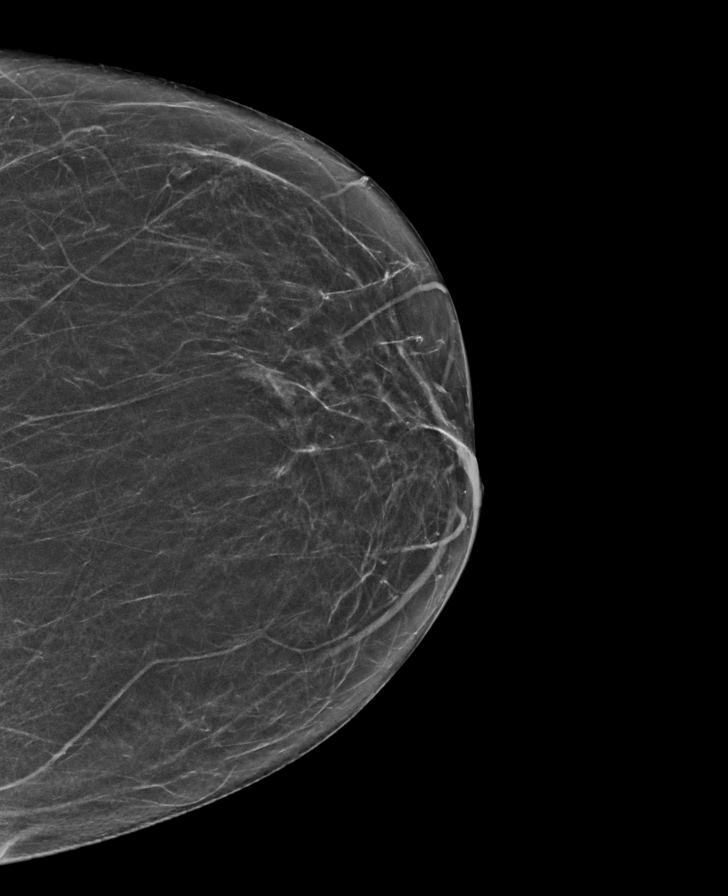

[L CC (3 of 3)]
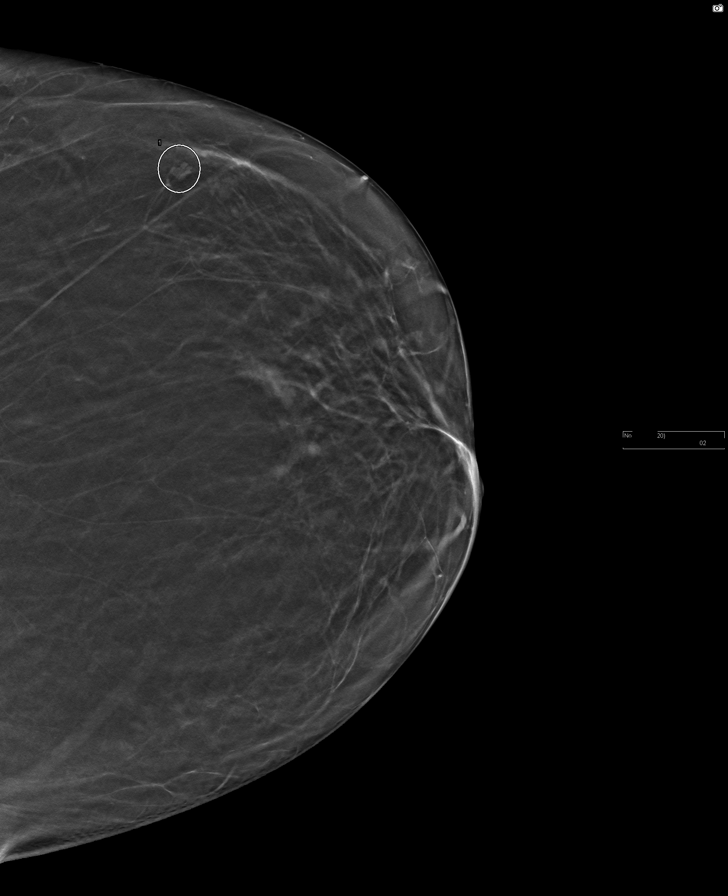

[L MLO (2 of 2)]
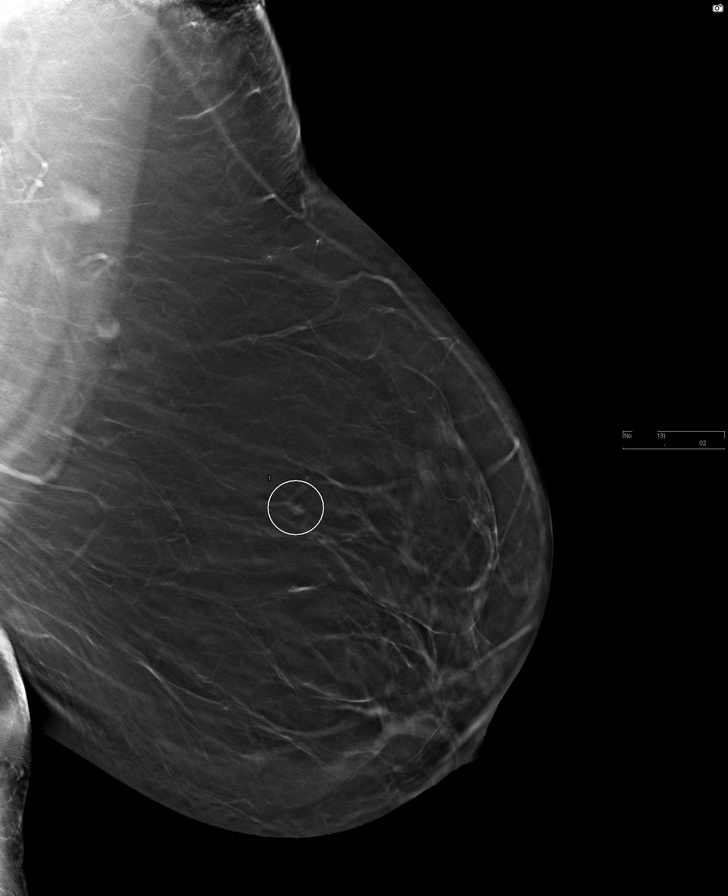

[8 of 25 positions shown; findings below may reference images not displayed]

ACR Breast Density Category b: There are scattered areas of
fibroglandular density.
FINDINGS: Tomosynthesis images of the lateral aspect of the left breast
demonstrate no persistent suspicious masses or asymmetries.

Mammographic images were processed with CAD.

Physical exam of the lateral aspect of the left breast demonstrates
normal fibroglandular tissue. No masses or suspicious areas of
shadowing are identified.
IMPRESSION: There are no mammographic or targeted sonographic abnormalities in
the lateral aspect of the left breast. No mammographic evidence of
malignancy in the left breast.

RECOMMENDATION:
Screening mammogram in one year.(Code:L8-4-518)

I have discussed the findings and recommendations with the patient.
Results were also provided in writing at the conclusion of the
visit. If applicable, a reminder letter will be sent to the patient
regarding the next appointment.

BI-RADS CATEGORY  1: Negative.

## 2017-12-22 ENCOUNTER — Ambulatory Visit (INDEPENDENT_AMBULATORY_CARE_PROVIDER_SITE_OTHER): Payer: Medicare HMO | Admitting: Family Medicine

## 2017-12-22 ENCOUNTER — Encounter: Payer: Self-pay | Admitting: Family Medicine

## 2017-12-22 VITALS — BP 102/62 | HR 74 | Temp 98.7°F

## 2017-12-22 DIAGNOSIS — J1569 Pneumonia due to other gram-negative bacteria: Secondary | ICD-10-CM

## 2017-12-22 DIAGNOSIS — R0902 Hypoxemia: Secondary | ICD-10-CM

## 2017-12-22 DIAGNOSIS — J449 Chronic obstructive pulmonary disease, unspecified: Secondary | ICD-10-CM | POA: Diagnosis not present

## 2017-12-22 DIAGNOSIS — J156 Pneumonia due to other aerobic Gram-negative bacteria: Secondary | ICD-10-CM

## 2017-12-22 NOTE — Progress Notes (Signed)
Patient: Theresa [name] Female    DOB: Aug 19, 1956   62 y.o.   MRN: [id number] Visit Date: 12/22/2017  Today's Provider: [name], PA   Chief Complaint  Patient presents with  . Hospitalization Follow-up   Subjective:    HPI     Follow up Hospitalization:: Patient was admitted to [facility] ICU on 12/09/17 and left against medical advice on 12/11/17. She was treated for acute respiratory failure. When she arrived at ER via EMS O2 sats were 60-70%. Patient's husband states patient fell asleep and her portable O2 machine went dead, and she didn't receive any O2 is why her levels dropped so low. Patient was placed on a ventilator and she was extubated on 12/11/17. Patient was going to be transferred to telemetry for management when she decided to leave against medical advice.   Past Medical History:  Diagnosis Date  . Acute postoperative pain 10/03/2017  . Anxiety   . Carpal tunnel syndrome   . CHF (congestive heart failure) ([place])    1/18  . CHF (congestive heart failure) ([place])   . Chronic generalized abdominal pain   . Chronic rhinitis   . COPD (chronic obstructive pulmonary disease) ([place])   . DDD (degenerative disc disease), cervical   . Depression   . Edema   . Flu    1/18  . Gastritis   . Hematuria   . Hypertension   . Kidney stones   . Low back pain   . Lumbar radiculitis   . Malodorous urine   . Muscle weakness   . Obesity   . Renal cyst   . Sensory urge incontinence   . Tobacco abuse    Past Surgical History:  Procedure Laterality Date  . ABDOMINAL HYSTERECTOMY    . CARPAL TUNNEL RELEASE Left   . TONSILLECTOMY    . TUBAL LIGATION     Family History  Problem Relation Age of Onset  . Heart disease Mother   . Stroke Mother   . Coronary artery disease Mother   . Lung cancer Sister   . Alcohol abuse Father   . Heart disease Father   . Prostate cancer Neg Hx   . Kidney cancer Neg Hx   . Bladder Cancer Neg Hx    Allergies  Allergen Reactions  .  Codeine Nausea And Vomiting  . Meloxicam Other (See Comments)    Stomach pain    Current Outpatient Medications:  .  ADVAIR DISKUS 250-50 MCG/DOSE AEPB, Inhale 1 puff into the lungs 2 (two) times daily., Disp: , Rfl: 3 .  albuterol (PROAIR HFA) 108 (90 Base) MCG/ACT inhaler, INHALE 2 PUFFS BY MOUTH EVERY 6 HOURS AS NEEDED FOR WHEEZING, Disp: , Rfl:  .  ALPRAZolam (XANAX) 0.5 MG tablet, Take 1 tablet (0.5 mg total) by mouth 2 (two) times daily as needed for anxiety. (Patient taking differently: Take 0.25 mg by mouth 2 (two) times daily as needed for anxiety. ), Disp: 60 tablet, Rfl: 0 .  Aspirin-Caffeine (BC FAST PAIN RELIEF ARTHRITIS) 1000-65 MG PACK, Take 1 packet by mouth every 6 (six) hours as needed (Pain)., Disp: , Rfl:  .  bisoprolol-hydrochlorothiazide (ZIAC) 10-6.25 MG tablet, Take 1 tablet by mouth daily., Disp: , Rfl:  .  Cholecalciferol ([employer] D3) 2000 units CAPS, Take 1 capsule by mouth daily., Disp: , Rfl:  .  clotrimazole (MYCELEX) 10 MG troche, Take 10 mg by mouth 4 (four) times daily. Dissolve lozenge in the mouth. DO NOT SWALLOW WHOLE.,  Disp: , Rfl: 0 .  DALIRESP 500 MCG TABS tablet, Take 500 mcg by mouth daily., Disp: , Rfl:  .  Fluticasone-Salmeterol (ADVAIR DISKUS) 250-50 MCG/DOSE AEPB, INHALE 1 PUFF INTO LUNGS TWICE A DAY, Disp: , Rfl:  .  furosemide (LASIX) 40 MG tablet, Take 40 mg by mouth 2 (two) times daily., Disp: , Rfl:  .  gabapentin (NEURONTIN) 100 MG capsule, Take 300 mg by mouth at bedtime. , Disp: , Rfl:  .  HYDROcodone-acetaminophen (NORCO/VICODIN) 5-325 MG tablet, Take 1 tablet by mouth every 8 (eight) hours as needed for severe pain., Disp: , Rfl: 0 .  ipratropium-albuterol (DUONEB) 0.5-2.5 (3) MG/3ML SOLN, Inhale 3 mLs into the lungs every 6 (six) hours as needed for shortness of breath., Disp: , Rfl: 3 .  ketoconazole (NIZORAL) 2 % cream, Apply 1 application topically daily., Disp: , Rfl:  .  omeprazole (PRILOSEC) 20 MG capsule, Take 20 mg by mouth daily., Disp:  , Rfl: 0 .  OXYGEN, Inhale 2.5 L into the lungs continuous. Reported on 11/03/2015, Disp: , Rfl:  .  potassium chloride SA (K-DUR,KLOR-CON) 20 MEQ tablet, Take 20 mEq by mouth 2 (two) times daily., Disp: , Rfl:  .  ranitidine (ZANTAC) 150 MG tablet, Take 150 mg by mouth 2 (two) times daily., Disp: , Rfl:  .  sertraline (ZOLOFT) 100 MG tablet, Take 200 mg by mouth every morning., Disp: , Rfl: 0 .  traZODone (DESYREL) 50 MG tablet, Take 75 mg by mouth at bedtime as needed., Disp: , Rfl: 0 .  VENTOLIN HFA 108 (90 Base) MCG/ACT inhaler, Inhale 2 puffs into the lungs every 6 (six) hours as needed for shortness of breath., Disp: , Rfl: 5  Review of Systems  Constitutional: Negative.   Respiratory: Positive for cough, shortness of breath and wheezing.   Cardiovascular: Negative.    Social History   Tobacco Use  . Smoking status: Current Every Day Smoker    Packs/day: 1.00    Years: 40.00    Pack years: 40.00    Types: Cigarettes  . Smokeless tobacco: Never Used  . Tobacco comment: first day of patch patient states she's trying  Substance Use Topics  . Alcohol use: No    Frequency: Never   Objective:   BP 102/62 (BP Location: Right Arm, Patient Position: Sitting, Cuff Size: Normal)   Pulse 74   Temp 98.7 F (37.1 C) (Oral)   SpO2 96%   Physical Exam  Constitutional: She appears well-developed and well-nourished.  HENT:  Head: Normocephalic.  Right Ear: External ear normal.  Left Ear: External ear normal.  Nose: Nose normal.  Mouth/Throat: Oropharynx is clear and moist.  Eyes: Conjunctivae are normal.  Neck: No thyromegaly present.  Cardiovascular: Normal rate and regular rhythm.  Pulmonary/Chest: Effort normal and breath sounds normal.  Distant breath sounds with no rales or rhonchi.  Abdominal: Soft. Bowel sounds are normal.  Lymphadenopathy:    She has no cervical adenopathy.      Assessment & Plan:     1. Moraxella catarrhalis pneumonia [facility]) Hospitalized with  intubation  and admitted to ICU on 12-10-17. Sputum cultures isolated Moraxella but blood cultures were negative. Was treated with Augmentin and prednisone - finished both. Still on oxygen by nasal cannula 24 hrs a day and using Ventolin-HFA prn, Duoneb QID at home by nebulizer with Advair Diskus 250-50 BID. Feeling stable at her usual state of dyspnea. Will check CBC with diff to see that the infection cleared with WBC  count back down. Recheck prn. - CBC with Differential/Platelet  2. COPD with hypoxia ([place]) Pulse oximetry 96% today on oxygen 3 LPM by a nasal cannula. No wheezes, rales or rhonchi at the present. Breath sounds very distant and short winded. Continue treatment as above.      [name], PA  [facility]

## 2017-12-23 LAB — CBC WITH DIFFERENTIAL/PLATELET
Basophils Absolute: 0 10*3/uL (ref 0.0–0.2)
Basos: 0 %
EOS (ABSOLUTE): 0.1 10*3/uL (ref 0.0–0.4)
Eos: 2 %
Hematocrit: 43.2 % (ref 34.0–46.6)
Hemoglobin: 13.8 g/dL (ref 11.1–15.9)
Immature Grans (Abs): 0 10*3/uL (ref 0.0–0.1)
Immature Granulocytes: 0 %
Lymphocytes Absolute: 1.6 10*3/uL (ref 0.7–3.1)
Lymphs: 20 %
MCH: 32.5 pg (ref 26.6–33.0)
MCHC: 31.9 g/dL (ref 31.5–35.7)
MCV: 102 fL — ABNORMAL HIGH (ref 79–97)
Monocytes Absolute: 0.6 10*3/uL (ref 0.1–0.9)
Monocytes: 7 %
Neutrophils Absolute: 5.8 10*3/uL (ref 1.4–7.0)
Neutrophils: 71 %
Platelets: 173 10*3/uL (ref 150–379)
RBC: 4.25 x10E6/uL (ref 3.77–5.28)
RDW: 14.3 % (ref 12.3–15.4)
WBC: 8.2 10*3/uL (ref 3.4–10.8)

## 2017-12-25 ENCOUNTER — Telehealth: Payer: Self-pay

## 2017-12-25 NOTE — Telephone Encounter (Signed)
-----  Message from [employer], [place] sent at 12/23/2017 10:18 AM EST ----- WBC count back to normal - indicates infection cleared. Finish any left over antibiotic and recheck as needed.

## 2017-12-25 NOTE — Telephone Encounter (Signed)
Left message to call back.

## 2017-12-25 NOTE — Telephone Encounter (Signed)
Advised patient as below.

## 2017-12-26 ENCOUNTER — Ambulatory Visit (HOSPITAL_BASED_OUTPATIENT_CLINIC_OR_DEPARTMENT_OTHER): Payer: Medicare HMO | Admitting: Pain Medicine

## 2017-12-26 ENCOUNTER — Encounter: Payer: Self-pay | Admitting: Pain Medicine

## 2017-12-26 ENCOUNTER — Ambulatory Visit
Admission: RE | Admit: 2017-12-26 | Discharge: 2017-12-26 | Disposition: A | Discharge status: Home / Self Care | Payer: Medicare HMO | Source: Ambulatory Visit | Attending: Pain Medicine | Admitting: Pain Medicine

## 2017-12-26 ENCOUNTER — Other Ambulatory Visit: Payer: Self-pay

## 2017-12-26 VITALS — BP 118/73 | HR 76 | Temp 97.8°F | Resp 23 | Ht 63.0 in | Wt 237.0 lb

## 2017-12-26 DIAGNOSIS — M5417 Radiculopathy, lumbosacral region: Secondary | ICD-10-CM

## 2017-12-26 DIAGNOSIS — G8929 Other chronic pain: Secondary | ICD-10-CM

## 2017-12-26 DIAGNOSIS — Z888 Allergy status to other drugs, medicaments and biological substances status: Secondary | ICD-10-CM | POA: Diagnosis not present

## 2017-12-26 DIAGNOSIS — M48062 Spinal stenosis, lumbar region with neurogenic claudication: Secondary | ICD-10-CM

## 2017-12-26 DIAGNOSIS — M5416 Radiculopathy, lumbar region: Secondary | ICD-10-CM

## 2017-12-26 DIAGNOSIS — M5126 Other intervertebral disc displacement, lumbar region: Secondary | ICD-10-CM | POA: Insufficient documentation

## 2017-12-26 DIAGNOSIS — M47816 Spondylosis without myelopathy or radiculopathy, lumbar region: Secondary | ICD-10-CM | POA: Diagnosis not present

## 2017-12-26 DIAGNOSIS — M48061 Spinal stenosis, lumbar region without neurogenic claudication: Secondary | ICD-10-CM

## 2017-12-26 DIAGNOSIS — M4726 Other spondylosis with radiculopathy, lumbar region: Secondary | ICD-10-CM | POA: Insufficient documentation

## 2017-12-26 DIAGNOSIS — M79605 Pain in left leg: Secondary | ICD-10-CM

## 2017-12-26 DIAGNOSIS — M9983 Other biomechanical lesions of lumbar region: Secondary | ICD-10-CM | POA: Diagnosis not present

## 2017-12-26 DIAGNOSIS — M5136 Other intervertebral disc degeneration, lumbar region: Secondary | ICD-10-CM | POA: Diagnosis not present

## 2017-12-26 MED ORDER — TRIAMCINOLONE ACETONIDE 40 MG/ML IJ SUSP
40.0000 mg | Freq: Once | INTRAMUSCULAR | Status: AC
  Administered 2017-12-26: 40 mg
  Filled 2017-12-26: qty 1

## 2017-12-26 MED ORDER — MIDAZOLAM HCL 5 MG/5ML IJ SOLN
1.0000 mg | INTRAMUSCULAR | Status: AC | PRN
  Administered 2017-12-26: 1 mg via INTRAVENOUS
  Filled 2017-12-26: qty 5

## 2017-12-26 MED ORDER — LIDOCAINE HCL 2 % IJ SOLN
10.0000 mL | Freq: Once | INTRAMUSCULAR | Status: AC
  Administered 2017-12-26: 400 mg
  Filled 2017-12-26: qty 20

## 2017-12-26 MED ORDER — LACTATED RINGERS IV SOLN
1000.0000 mL | Freq: Once | INTRAVENOUS | Status: AC
  Administered 2017-12-26: 1000 mL via INTRAVENOUS

## 2017-12-26 MED ORDER — ROPIVACAINE HCL 2 MG/ML IJ SOLN
1.0000 mL | Freq: Once | INTRAMUSCULAR | Status: AC
  Administered 2017-12-26: 10 mL via EPIDURAL
  Filled 2017-12-26: qty 10

## 2017-12-26 MED ORDER — SODIUM CHLORIDE 0.9% FLUSH
1.0000 mL | Freq: Once | INTRAVENOUS | Status: AC
  Administered 2017-12-26: 10 mL

## 2017-12-26 MED ORDER — IOPAMIDOL (ISOVUE-M 200) INJECTION 41%
10.0000 mL | Freq: Once | INTRAMUSCULAR | Status: AC
  Administered 2017-12-26: 10 mL via EPIDURAL
  Filled 2017-12-26: qty 10

## 2017-12-26 MED ORDER — DEXAMETHASONE SODIUM PHOSPHATE 10 MG/ML IJ SOLN
10.0000 mg | Freq: Once | INTRAMUSCULAR | Status: AC
  Administered 2017-12-26: 10 mg
  Filled 2017-12-26: qty 1

## 2017-12-26 MED ORDER — ROPIVACAINE HCL 2 MG/ML IJ SOLN
2.0000 mL | Freq: Once | INTRAMUSCULAR | Status: AC
  Administered 2017-12-26: 10 mL via EPIDURAL
  Filled 2017-12-26: qty 10

## 2017-12-26 MED ORDER — SODIUM CHLORIDE 0.9% FLUSH
2.0000 mL | Freq: Once | INTRAVENOUS | Status: AC
  Administered 2017-12-26: 10 mL

## 2017-12-26 MED ORDER — FENTANYL CITRATE (PF) 100 MCG/2ML IJ SOLN
25.0000 ug | INTRAMUSCULAR | Status: AC | PRN
  Administered 2017-12-26: 50 ug via INTRAVENOUS
  Filled 2017-12-26: qty 2

## 2017-12-26 NOTE — Progress Notes (Signed)
Safety precautions to be maintained throughout the outpatient stay will include: orient to surroundings, keep bed in low position, maintain call bell within reach at all times, provide assistance with transfer out of bed and ambulation.  

## 2017-12-26 NOTE — Patient Instructions (Signed)
____________________________________________________________________________________________  Post-Procedure Discharge Instructions  Instructions:  Apply ice: Fill a plastic sandwich bag with crushed ice. Cover it with a small towel and apply to injection site. Apply for 15 minutes then remove x 15 minutes. Repeat sequence on day of procedure, until you go to bed. The purpose is to minimize swelling and discomfort after procedure.  Apply heat: Apply heat to procedure site starting the day following the procedure. The purpose is to treat any soreness and discomfort from the procedure.  Food intake: Start with clear liquids (like water) and advance to regular food, as tolerated.   Physical activities: Keep activities to a minimum for the first 8 hours after the procedure.   Driving: If you have received any sedation, you are not allowed to drive for 24 hours after your procedure.  Blood thinner: Restart your blood thinner 6 hours after your procedure. (Only for those taking blood thinners)  Insulin: As soon as you can eat, you may resume your normal dosing schedule. (Only for those taking insulin)  Infection prevention: Keep procedure site clean and dry.  Post-procedure Pain Diary: Extremely important that this be done correctly and accurately. Recorded information will be used to determine the next step in treatment.  Pain evaluated is that of treated area only. Do not include pain from an untreated area.  Complete every hour, on the hour, for the initial 8 hours. Set an alarm to help you do this part accurately.  Do not go to sleep and have it completed later. It will not be accurate.  Follow-up appointment: Keep your follow-up appointment after the procedure. Usually 2 weeks for most procedures. (6 weeks in the case of radiofrequency.) Bring you pain diary.   Expect:  From numbing medicine (AKA: Local Anesthetics): Numbness or decrease in pain.  Onset: Full effect within 15  minutes of injected.  Duration: It will depend on the type of local anesthetic used. On the average, 1 to 8 hours.   From steroids: Decrease in swelling or inflammation. Once inflammation is improved, relief of the pain will follow.  Onset of benefits: Depends on the amount of swelling present. The more swelling, the longer it will take for the benefits to be seen. In some cases, up to 10 days.  Duration: Steroids will stay in the system x 2 weeks. Duration of benefits will depend on multiple posibilities including persistent irritating factors.  From procedure: Some discomfort is to be expected once the numbing medicine wears off. This should be minimal if ice and heat are applied as instructed.  Call if:  You experience numbness and weakness that gets worse with time, as opposed to wearing off.  New onset bowel or bladder incontinence. (This applies to Spinal procedures only)  Emergency Numbers:  [facility] business hours (Monday - Thursday, 8:00 AM - 4:00 PM) (Friday, 9:00 AM - 12:00 Noon): [phone]  After hours: [phone] ____________________________________________________________________________________________

## 2017-12-26 NOTE — Progress Notes (Signed)
Patient's Name: Theresa [name]  MRN: [id number]  Referring Provider: Milinda Pointer, MD  DOB: 03-24-1956  PCP: Margo Common, PA  DOS: 12/26/2017  Note by: Gaspar Cola, MD  Service setting: Ambulatory outpatient  Specialty: Interventional Pain Management  Patient type: Established  Location: [facility] (AMB) Pain Management Facility  Visit type: Interventional Procedure   Primary Reason for Visit: Interventional Pain Management Treatment. CC: Back Pain (lower)  Procedure:  Anesthesia, Analgesia, Anxiolysis:  Procedure #1: Type: Therapeutic Inter-Laminar Epidural Steroid Injection  #1  Region: Lumbar Level: L4-5 Level. Laterality: Left-Sided Paramedial  Procedure #2: Type: Therapeutic Trans-Foraminal Epidural Steroid Injection  #1  Region: Lumbar Level: L4 Paravertebral Laterality: Left-Sided Paravertebral Position: Prone  Type: Moderate (Conscious) Sedation combined with Local Anesthesia Indication(s): Analgesia and Anxiety Route: Intravenous (IV) IV Access: Secured Sedation: Meaningful verbal contact was maintained at all times during the procedure  Local Anesthetic: Lidocaine 1-2%   Indications: 1. DDD (degenerative disc disease), lumbar   2. 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 Spinal stenosis of lumbar region with neurogenic claudication   5. Lumbar spondylosis (L4-5)   6. Lumbosacral radiculopathy at L4    Pain Score: Pre-procedure: 8 /10 Post-procedure: 0-No pain/10  Pre-op Assessment:  Ms. Blanchard is a 62 y.o. (year old), female patient, seen today for interventional treatment. She  has a past surgical history that includes Abdominal hysterectomy; Tonsillectomy; Carpal tunnel release (Left); and Tubal ligation. Ms. Yellin has a current medication list which includes the following prescription(s): advair diskus, albuterol, alprazolam, aspirin-caffeine, bisoprolol-hydrochlorothiazide,  cholecalciferol, clotrimazole, daliresp, fluticasone-salmeterol, furosemide, gabapentin, hydrocodone-acetaminophen, ipratropium-albuterol, ketoconazole, omeprazole, oxygen-helium, potassium chloride sa, ranitidine, sertraline, trazodone, and ventolin hfa. Her primarily concern today is the Back Pain (lower)  Initial Vital Signs:  Pulse Rate: 76 Temp: 98.5 F (36.9 C) Resp: 16 BP: 93/60 SpO2: 95 %(O2 at 2.5 linters/min)  BMI: Estimated body mass index is 41.98 kg/m as calculated from the following:   Height as of this encounter: _0  (1.6 m).   Weight as of this encounter: 237 lb (107.5 kg).  Risk Assessment: Allergies: Reviewed. She is allergic to codeine and meloxicam.  Allergy Precautions: None required Coagulopathies: Reviewed. None identified.  Blood-thinner therapy: None at this time Active Infection(s): Reviewed. None identified. Ms. Ballman is afebrile  Site Confirmation: Ms. Frysinger was asked to confirm the procedure and laterality before marking the site Procedure checklist: Completed Consent: Before the procedure and under the influence of no sedative(s), amnesic(s), or anxiolytics, the patient was informed of the treatment options, risks and possible complications. To fulfill our ethical and legal obligations, as recommended by the American Medical Association's Code of Ethics, I have informed the patient of my clinical impression; the nature and purpose of the treatment or procedure; the risks, benefits, and possible complications of the intervention; the alternatives, including doing nothing; the risk(s) and benefit(s) of the alternative treatment(s) or procedure(s); and the risk(s) and benefit(s) of doing nothing. The patient was provided information about the general risks and possible complications associated with the procedure. These may include, but are not limited to: failure to achieve desired goals, infection, bleeding, organ or nerve damage, allergic reactions,  paralysis, and death. In addition, the patient was informed of those risks and complications associated to Spine-related procedures, such as failure to decrease pain; infection (i.e.: Meningitis, epidural or intraspinal abscess); bleeding (i.e.: epidural hematoma, subarachnoid hemorrhage, or any other type of intraspinal or peri-dural bleeding); organ or  nerve damage (i.e.: Any type of peripheral nerve, nerve root, or spinal cord injury) with subsequent damage to sensory, motor, and/or autonomic systems, resulting in permanent pain, numbness, and/or weakness of one or several areas of the body; allergic reactions; (i.e.: anaphylactic reaction); and/or death. Furthermore, the patient was informed of those risks and complications associated with the medications. These include, but are not limited to: allergic reactions (i.e.: anaphylactic or anaphylactoid reaction(s)); adrenal axis suppression; blood sugar elevation that in diabetics may result in ketoacidosis or comma; water retention that in patients with history of congestive heart failure may result in shortness of breath, pulmonary edema, and decompensation with resultant heart failure; weight gain; swelling or edema; medication-induced neural toxicity; particulate matter embolism and blood vessel occlusion with resultant organ, and/or nervous system infarction; and/or aseptic necrosis of one or more joints. Finally, the patient was informed that Medicine is not an exact science; therefore, there is also the possibility of unforeseen or unpredictable risks and/or possible complications that may result in a catastrophic outcome. The patient indicated having understood very clearly. We have given the patient no guarantees and we have made no promises. Enough time was given to the patient to ask questions, all of which were answered to the patient's satisfaction. Ms. Stander has indicated that she wanted to continue with the procedure. Attestation: I, the  ordering provider, attest that I have discussed with the patient the benefits, risks, side-effects, alternatives, likelihood of achieving goals, and potential problems during recovery for the procedure that I have provided informed consent. Date  Time: 12/26/2017  1:19 PM  Pre-Procedure Preparation:  Monitoring: As per clinic protocol. Respiration, ETCO2, SpO2, BP, heart rate and rhythm monitor placed and checked for adequate function Safety Precautions: Patient was assessed for positional comfort and pressure points before starting the procedure. Time-out: I initiated and conducted the "Time-out" before starting the procedure, as per protocol. The patient was asked to participate by confirming the accuracy of the "Time Out" information. Verification of the correct person, site, and procedure were performed and confirmed by me, the nursing staff, and the patient. "Time-out" conducted as per Joint Commission's Universal Protocol (UP.01.01.01). Time: 1450  Description of Procedure #1 Process:   Target Area: The  interlaminar space, initially targeting the lower border of the superior vertebral body lamina. Approach: Posterior paramedial approach. Area Prepped: Entire Posterior Lumbosacral Region Prepping solution: ChloraPrep (2% chlorhexidine gluconate and 70% isopropyl alcohol) Safety Precautions: Aspiration looking for blood return was conducted prior to all injections. At no point did we inject any substances, as a needle was being advanced. No attempts were made at seeking any paresthesias. Safe injection practices and needle disposal techniques used. Medications properly checked for expiration dates. SDV (single dose vial) medications used. Description of the Procedure: Protocol guidelines were followed. The patient was placed in position over the fluoroscopy table. The target area was identified and the area prepped in the usual manner. Skin desensitized using vapocoolant spray. Skin & deeper  tissues infiltrated with local anesthetic. Appropriate amount of time allowed to pass for local anesthetics to take effect. The procedure needle was introduced through the skin, ipsilateral to the reported pain, and advanced to the target area. Bone was contacted and the needle walked caudad, until the lamina was cleared. The ligamentum flavum was engaged and loss-of-resistance technique used as the epidural needle was advanced. The epidural space was identified using "loss-of-resistance technique" with 2-3 ml of PF-NaCl (0.9% NSS), in a 5cc LOR glass syringe. Proper needle placement secured.  Negative aspiration confirmed. Solution injected in intermittent fashion, asking for systemic symptoms every 0.5cc of injectate. The needles were then removed and the area cleansed, making sure to leave some of the prepping solution back to take advantage of its long term bactericidal properties. Start Time: 1450 hrs. Materials:  Needle(s) Type: Epidural needle Gauge: 17G Length: 3.5-in Medication(s): Please see orders for medications and dosing details.  Description of Procedure #2 Process:   Target Area: The inferior and lateral portion of the pedicle, just lateral to a line created by the 6:00 position of the pedicle and the superior articular process of the vertebral body below. On the lateral view, this target lies just posterior to the anterior aspect of the lamina and posterior to the midpoint created between the anterior and the posterior aspect of the neural foramina. Approach: Posterior paravertebral approach. Area Prepped: Same as above Prepping solution: Same as above Safety Precautions: Same as above Description of the Procedure: Protocol guidelines were followed. The patient was placed in position over the fluoroscopy table. The target area was identified and the area prepped in the usual manner. Skin desensitized using vapocoolant spray. Skin & deeper tissues infiltrated with local anesthetic.  Appropriate amount of time allowed to pass for local anesthetics to take effect. The procedure needles were then advanced to the target area. Proper needle placement secured. Negative aspiration confirmed. Solution injected in intermittent fashion, asking for systemic symptoms every 0.2cc of injectate. The needles were then removed and the area cleansed, making sure to leave some of the prepping solution back to take advantage of its long term bactericidal properties. Vitals:   12/26/17 1506 12/26/17 1514 12/26/17 1524 12/26/17 1533  BP: 121/80 117/75 127/74 118/73  Pulse:      Resp: (!) 22 14 (!) 21 (!) 23  Temp:  97.8 F (36.6 C)    TempSrc:  Temporal    SpO2: 96% 98% 98% 98%  Weight:      Height:        End Time: 1505 hrs. Materials:  Needle(s) Type: Regular needle Gauge: 22G Length: 3.5-in Medication(s): Please see orders for medications and dosing details.  Imaging Guidance (Spinal):  Type of Imaging Technique: Fluoroscopy Guidance (Spinal) Indication(s): Assistance in needle guidance and placement for procedures requiring needle placement in or near specific anatomical locations not easily accessible without such assistance. Exposure Time: Please see nurses notes. Contrast: Before injecting any contrast, we confirmed that the patient did not have an allergy to iodine, shellfish, or radiological contrast. Once satisfactory needle placement was completed at the desired level, radiological contrast was injected. Contrast injected under live fluoroscopy. No contrast complications. See chart for type and volume of contrast used. Fluoroscopic Guidance: I was personally present during the use of fluoroscopy. "Tunnel Vision Technique" used to obtain the best possible view of the target area. Parallax error corrected before commencing the procedure. "Direction-depth-direction" technique used to introduce the needle under continuous pulsed fluoroscopy. Once target was reached, antero-posterior,  oblique, and lateral fluoroscopic projection used confirm needle placement in all planes. Images permanently stored in EMR. Interpretation: I personally interpreted the imaging intraoperatively. Adequate needle placement confirmed in multiple planes. Appropriate spread of contrast into desired area was observed. No evidence of afferent or efferent intravascular uptake. No intrathecal or subarachnoid spread observed. Permanent images saved into the patient's record.  Antibiotic Prophylaxis:   Anti-infectives (From admission, onward)   None     Indication(s): None identified  Post-operative Assessment:  Post-procedure Vital Signs:  Pulse Rate:  76 Temp: 97.8 F (36.6 C) Resp: (!) 23 BP: 118/73 SpO2: 98 %  EBL: None  Complications: No immediate post-treatment complications observed by team, or reported by patient.  Note: The patient tolerated the entire procedure well. A repeat set of vitals were taken after the procedure and the patient was kept under observation following institutional policy, for this type of procedure. Post-procedural neurological assessment was performed, showing return to baseline, prior to discharge. The patient was provided with post-procedure discharge instructions, including a section on how to identify potential problems. Should any problems arise concerning this procedure, the patient was given instructions to immediately contact us, at any time, without hesitation. In any case, we plan to contact the patient by telephone for a follow-up status report regarding this interventional procedure.  Comments:  No additional relevant information.  Plan of Care    Imaging Orders     DG C-Arm 1-60 Min-No Report  Procedure Orders     Lumbar Epidural Injection     Lumbar Transforaminal Epidural  Medications ordered for procedure: Meds ordered this encounter  Medications  . midazolam (VERSED) 5 MG/5ML injection 1-2 mg    Make sure Flumazenil is available in the  pyxis when using this medication. If oversedation occurs, administer 0.2 mg IV over 15 sec. If after 45 sec no response, administer 0.2 mg again over 1 min; may repeat at 1 min intervals; not to exceed 4 doses (1 mg)  . fentaNYL (SUBLIMAZE) injection 25-50 mcg    Make sure Narcan is available in the pyxis when using this medication. In the event of respiratory depression (RR< 8/min): Titrate NARCAN (naloxone) in increments of 0.1 to 0.2 mg IV at 2-3 minute intervals, until desired degree of reversal.  . lactated ringers infusion 1,000 mL  . iopamidol (ISOVUE-M) 41 % intrathecal injection 10 mL    Must be Myelogram-compatible. If not available, you may substitute with a water-soluble, non-ionic, hypoallergenic, myelogram-compatible radiological contrast medium.  Marland Kitchen lidocaine (XYLOCAINE) 2 % (with pres) injection 200 mg  . sodium chloride flush (NS) 0.9 % injection 2 mL  . ropivacaine (PF) 2 mg/mL (0.2%) (NAROPIN) injection 2 mL  . triamcinolone acetonide (KENALOG-40) injection 40 mg  . sodium chloride flush (NS) 0.9 % injection 1 mL  . ropivacaine (PF) 2 mg/mL (0.2%) (NAROPIN) injection 1 mL  . dexamethasone (DECADRON) injection 10 mg   Medications administered: We administered midazolam, fentaNYL, lactated ringers, iopamidol, lidocaine, sodium chloride flush, ropivacaine (PF) 2 mg/mL (0.2%), triamcinolone acetonide, sodium chloride flush, ropivacaine (PF) 2 mg/mL (0.2%), and dexamethasone.  See the medical record for exact dosing, route, and time of administration.  New Prescriptions   No medications on file   Disposition: Discharge home  Discharge Date & Time: 12/26/2017; 1536 hrs.   Physician-requested Follow-up: Return for post-procedure eval (2 wks), w/ Dr. Dossie Arbour.  Future Appointments  Date Time Provider [place]  01/08/2018  2:00 PM Milinda Pointer, MD [facility] None  01/31/2018  2:30 PM Vevelyn Francois, NP [facility] None  03/14/2018  1:20 PM [name], FNP  [facility] None  04/10/2018  3:00 PM Harlin Heys, MD [facility] None   Primary Care Physician: Margo Common, PA Location: [facility] Facility Note by: Gaspar Cola, MD Date: 12/26/2017; Time: 3:14 PM  Disclaimer:  Medicine is not an Chief Strategy Officer. The only guarantee in medicine is that nothing is guaranteed. It is important to note that the decision to proceed with this intervention was based on  the information collected from the patient. The Data and conclusions were drawn from the patient's questionnaire, the interview, and the physical examination. Because the information was provided in large part by the patient, it cannot be guaranteed that it has not been purposely or unconsciously manipulated. Every effort has been made to obtain as much relevant data as possible for this evaluation. It is important to note that the conclusions that lead to this procedure are derived in large part from the available data. Always take into account that the treatment will also be dependent on availability of resources and existing treatment guidelines, considered by other Pain Management Practitioners as being common knowledge and practice, at the time of the intervention. For Medico-Legal purposes, it is also important to point out that variation in procedural techniques and pharmacological choices are the acceptable norm. The indications, contraindications, technique, and results of the above procedure should only be interpreted and judged by a Board-Certified Interventional Pain Specialist with extensive familiarity and expertise in the same exact procedure and technique.

## 2017-12-27 ENCOUNTER — Telehealth: Payer: Self-pay | Admitting: *Deleted

## 2017-12-27 NOTE — Telephone Encounter (Signed)
Voicemail left with patient to call our office if there are questions or concerns re; procedure on yesterday.

## 2017-12-29 DIAGNOSIS — J9610 Chronic respiratory failure, unspecified whether with hypoxia or hypercapnia: Secondary | ICD-10-CM | POA: Diagnosis not present

## 2018-01-01 DIAGNOSIS — J9610 Chronic respiratory failure, unspecified whether with hypoxia or hypercapnia: Secondary | ICD-10-CM | POA: Diagnosis not present

## 2018-01-02 ENCOUNTER — Telehealth: Payer: Self-pay

## 2018-01-02 DIAGNOSIS — R0602 Shortness of breath: Secondary | ICD-10-CM | POA: Diagnosis not present

## 2018-01-02 DIAGNOSIS — I2721 Secondary pulmonary arterial hypertension: Secondary | ICD-10-CM | POA: Diagnosis not present

## 2018-01-02 DIAGNOSIS — E872 Acidosis: Secondary | ICD-10-CM | POA: Diagnosis not present

## 2018-01-02 DIAGNOSIS — J449 Chronic obstructive pulmonary disease, unspecified: Secondary | ICD-10-CM | POA: Diagnosis not present

## 2018-01-02 DIAGNOSIS — J439 Emphysema, unspecified: Secondary | ICD-10-CM | POA: Diagnosis not present

## 2018-01-02 NOTE — Telephone Encounter (Signed)
LMTCB and schedule AWV and CPE. -MM

## 2018-01-04 ENCOUNTER — Other Ambulatory Visit: Payer: Self-pay | Admitting: Family

## 2018-01-08 ENCOUNTER — Ambulatory Visit: Payer: Medicare HMO | Attending: Pain Medicine | Admitting: Pain Medicine

## 2018-01-08 ENCOUNTER — Encounter: Payer: Self-pay | Admitting: Pain Medicine

## 2018-01-08 VITALS — BP 106/42 | HR 70 | Temp 98.7°F | Resp 16 | Ht 63.0 in | Wt 234.0 lb

## 2018-01-08 DIAGNOSIS — Z885 Allergy status to narcotic agent status: Secondary | ICD-10-CM | POA: Diagnosis not present

## 2018-01-08 DIAGNOSIS — Z9981 Dependence on supplemental oxygen: Secondary | ICD-10-CM | POA: Diagnosis not present

## 2018-01-08 DIAGNOSIS — Z888 Allergy status to other drugs, medicaments and biological substances status: Secondary | ICD-10-CM | POA: Insufficient documentation

## 2018-01-08 DIAGNOSIS — Z79891 Long term (current) use of opiate analgesic: Secondary | ICD-10-CM | POA: Insufficient documentation

## 2018-01-08 DIAGNOSIS — J449 Chronic obstructive pulmonary disease, unspecified: Secondary | ICD-10-CM | POA: Diagnosis not present

## 2018-01-08 DIAGNOSIS — Z6841 Body Mass Index (BMI) 40.0 and over, adult: Secondary | ICD-10-CM | POA: Diagnosis not present

## 2018-01-08 DIAGNOSIS — F17210 Nicotine dependence, cigarettes, uncomplicated: Secondary | ICD-10-CM | POA: Diagnosis not present

## 2018-01-08 DIAGNOSIS — Z79899 Other long term (current) drug therapy: Secondary | ICD-10-CM | POA: Insufficient documentation

## 2018-01-08 DIAGNOSIS — F329 Major depressive disorder, single episode, unspecified: Secondary | ICD-10-CM | POA: Diagnosis not present

## 2018-01-08 DIAGNOSIS — G4730 Sleep apnea, unspecified: Secondary | ICD-10-CM | POA: Insufficient documentation

## 2018-01-08 DIAGNOSIS — G894 Chronic pain syndrome: Secondary | ICD-10-CM | POA: Diagnosis not present

## 2018-01-08 DIAGNOSIS — E559 Vitamin D deficiency, unspecified: Secondary | ICD-10-CM | POA: Insufficient documentation

## 2018-01-08 DIAGNOSIS — M4726 Other spondylosis with radiculopathy, lumbar region: Secondary | ICD-10-CM | POA: Insufficient documentation

## 2018-01-08 DIAGNOSIS — M5136 Other intervertebral disc degeneration, lumbar region: Secondary | ICD-10-CM | POA: Diagnosis not present

## 2018-01-08 DIAGNOSIS — M5417 Radiculopathy, lumbosacral region: Secondary | ICD-10-CM

## 2018-01-08 DIAGNOSIS — M48062 Spinal stenosis, lumbar region with neurogenic claudication: Secondary | ICD-10-CM | POA: Insufficient documentation

## 2018-01-08 DIAGNOSIS — M5116 Intervertebral disc disorders with radiculopathy, lumbar region: Secondary | ICD-10-CM | POA: Diagnosis not present

## 2018-01-08 DIAGNOSIS — Z7982 Long term (current) use of aspirin: Secondary | ICD-10-CM | POA: Insufficient documentation

## 2018-01-08 DIAGNOSIS — M47816 Spondylosis without myelopathy or radiculopathy, lumbar region: Secondary | ICD-10-CM | POA: Diagnosis not present

## 2018-01-08 NOTE — Patient Instructions (Signed)
____________________________________________________________________________________________  Preparing for Procedure with Sedation  Instructions: . Oral Intake: Do not eat or drink anything for at least 8 hours prior to your procedure. . Transportation: Public transportation is not allowed. Bring an adult driver. The driver must be physically present in our waiting room before any procedure can be started. Marland Kitchen Physical Assistance: Bring an adult physically capable of assisting you, in the event you need help. This adult should keep you company at home for at least 6 hours after the procedure. . Blood Pressure Medicine: Take your blood pressure medicine with a sip of water the morning of the procedure. . Blood thinners:  . Diabetics on insulin: Notify the staff so that you can be scheduled 1st case in the morning. If your diabetes requires high dose insulin, take only  of your normal insulin dose the morning of the procedure and notify the staff that you have done so. . Preventing infections: Shower with an antibacterial soap the morning of your procedure. . Build-up your immune system: Take 1000 mg of Vitamin C with every meal (3 times a day) the day prior to your procedure. Marland Kitchen Antibiotics: Inform the staff if you have a condition or reason that requires you to take antibiotics before dental procedures. . Pregnancy: If you are pregnant, call and cancel the procedure. . Sickness: If you have a cold, fever, or any active infections, call and cancel the procedure. . Arrival: You must be in the facility at least 30 minutes prior to your scheduled procedure. . Children: Do not bring children with you. . Dress appropriately: Bring dark clothing that you would not mind if they get stained. . Valuables: Do not bring any jewelry or valuables.  Procedure appointments are reserved for interventional treatments only. Marland Kitchen No Prescription Refills. . No medication changes will be discussed during procedure  appointments. . No disability issues will be discussed.  Remember:  Regular Business hours are:  Monday to Thursday 8:00 AM to 4:00 PM  Provider's Schedule: Milinda Pointer, MD:  Procedure days: Tuesday and Thursday 7:30 AM to 4:00 PM  Gillis Santa, MD:  Procedure days: Monday and Wednesday 7:30 AM to 4:00 PM ____________________________________________________________________________________________

## 2018-01-08 NOTE — Progress Notes (Addendum)
Patient's Name: Theresa [name]  MRN: [id number]  Referring Provider: Margo Common, [place]  DOB: 08-24-1956  PCP: Margo Common, PA  DOS: 01/08/2018  Note by: Gaspar Cola, MD  Service setting: Ambulatory outpatient  Specialty: Interventional Pain Management  Location: [facility] (AMB) Pain Management Facility    Patient type: Established   Primary Reason(s) for Visit: Encounter for post-procedure evaluation of chronic illness with mild to moderate exacerbation CC: Back Pain (lower bilateral, right is worse currently)  HPI  Theresa [name] is a 62 y.o. year old, female patient, who comes today for a post-procedure evaluation. She has Neuritis or radiculitis due to rupture of lumbar intervertebral disc; Decreased motor strength; Essential (primary) hypertension; Clinical depression; DDD (degenerative disc disease), lumbar; CAFL (chronic airflow limitation) ([place]); Chronic rhinitis; Carpal tunnel syndrome; Blood clotting disorder ([place]); Anxiety state; Chronic diastolic heart failure ([place]); Chest pain; Tobacco abuse; Encounter for therapeutic drug level monitoring; Encounter for chronic pain management; Pain management; Neurogenic pain; Neuropathic pain; Musculoskeletal pain; Lumbar spondylosis (L4-5); Chronic low back pain (Primary Area of Pain) (Left); Chronic lower extremity pain (Secondary Area of Pain) (Left); Chronic lumbar radicular pain (L4 & S1 Dermatome) (Left); Chronic shoulder pain (Right side); Chronic upper extremity pain (Right-sided); Cervical radiculitis (Right side); Abnormal x-ray of lumbar spine; Chronic pain syndrome (significant psychosocial component); 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; Coagulation disorder ([place]); 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); Obesity, Class III, BMI 40-49.9 (morbid obesity) ([place]); 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[place]); Vitamin D deficiency; Long term current use of opiate analgesic; Long term prescription opiate use;  Opiate use (15 MME/Day); Lumbar facet syndrome (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); Chronic sacroiliac joint pain (Bilateral); Sleep apnea; Supplemental oxygen dependent; Lumbar facet hypertrophy (multilevel); Lumbar spinal stenosis (L4-5); 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); 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); Lumbosacral radiculopathy at L4; Grief at loss of child; Chronic shoulder pain (Left); Elevated brain natriuretic peptide (BNP) level; Arthralgia of acromioclavicular joint (Right); Acromioclavicular joint DJD (Right); Osteoarthritis of shoulder (Right); COPD with hypoxia ([place]); Other long term (current) drug therapy; Disorder of bone, unspecified; COPD (chronic obstructive pulmonary disease) ([place]); Chronic hip pain (Tertiary Area of Pain) (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); Altered mental status, unspecified; and Acute respiratory failure ([place]) on their problem list. Her primarily concern today is the Back Pain (lower bilateral, right is worse currently)  Pain Assessment: Location: Lower, Left, Right(treated Left at last proceduire) Back Radiating: na Onset: More than a month ago Duration: Chronic pain Quality: Discomfort, Dull, Constant Severity: 5 /10 (self-reported pain score)  Note: Reported level is compatible with observation.                         When using our objective Pain Scale, levels between 6 and 10/10 are said to belong in an emergency room, as it progressively worsens from a 6/10, described as severely limiting, requiring emergency care not usually available at an outpatient pain management facility. At a 6/10 level, communication becomes difficult and requires great effort. Assistance to reach the emergency department may be required. Facial flushing and profuse sweating along with potentially dangerous increases in heart rate and blood pressure will be evident. Effect on ADL: unable to walk very far without taking a break Timing: Constant Modifying factors: procedure helped,  medications, horse linament, DMSO  Theresa [name] comes in today for post-procedure evaluation after the treatment done on 12/26/2017.  Today we went over the proper way to assess postprocedure results in an effort to  get the maximum amount of diagnostic information possible.  Further details on both, my assessment(s), as well as the proposed treatment plan, please see below.  Post-Procedure Assessment  12/26/2017 Procedure: Diagnostic left-sided L4-5 interlaminar LESI #1+ diagnostic left-sided L4 transforaminal LESI #1 under fluoroscopic guidance and IV sedation Pre-procedure pain score:  8/10 Post-procedure pain score: 0/10 (100% relief) Influential Factors: BMI: 41.45 kg/m Intra-procedural challenges: None observed.         Assessment challenges: None detected.              Reported side-effects: None.        Post-procedural adverse reactions or complications: None reported         Sedation: Sedation provided. When no sedatives are used, the analgesic levels obtained are directly associated to the effectiveness of the local anesthetics. However, when sedation is provided, the level of analgesia obtained during the initial 1 hour following the intervention, is believed to be the result of a combination of factors. These factors may include, but are not limited to: 1. The effectiveness of the local anesthetics used. 2. The effects of the analgesic(s) and/or anxiolytic(s) used. 3. The degree of discomfort experienced by the patient at the time of the procedure. 4. The patients ability and reliability in recalling and recording the events. 5. The presence and influence of possible secondary gains and/or psychosocial factors. Reported result: Relief experienced during the 1st hour after the procedure: 100 % (Ultra-Short Term Relief)            Interpretative annotation: Clinically appropriate result. Analgesia during this period is likely to be Local Anesthetic and/or IV Sedative  (Analgesic/Anxiolytic) related.          Effects of local anesthetic: The analgesic effects attained during this period are directly associated to the localized infiltration of local anesthetics and therefore cary significant diagnostic value as to the etiological location, or anatomical origin, of the pain. Expected duration of relief is directly dependent on the pharmacodynamics of the local anesthetic used. Long-acting (4-6 hours) anesthetics used.  Reported result: Relief during the next 4 to 6 hour after the procedure: 100 % (Short-Term Relief)            Interpretative annotation: Clinically appropriate result. Analgesia during this period is likely to be Local Anesthetic-related.          Long-term benefit: Defined as the period of time past the expected duration of local anesthetics (1 hour for short-acting and 4-6 hours for long-acting). With the possible exception of prolonged sympathetic blockade from the local anesthetics, benefits during this period are typically attributed to, or associated with, other factors such as analgesic sensory neuropraxia, antiinflammatory effects, or beneficial biochemical changes provided by agents other than the local anesthetics.  Reported result: Extended relief following procedure: 50 % (Long-Term Relief)            Interpretative annotation: Clinically possible results. Partial relief. Incomplete therapeutic success. Inflammation plays a part in the etiology to the pain.          Current benefits: Defined as reported results that persistent at this point in time.   Analgesia: >50 % Theresa [name] reports that both, extremity and the axial pain improved with the treatment.  The patient indicates that she has attained excellent benefit on the left side that we injected, but very little on the right side.  She would like for [place] to treat the right side as well. Function: Somewhat improved ROM: Somewhat improved Interpretative annotation: Ongoing  benefit.  Therapeutic benefit observed. Effective therapeutic approach.          Interpretation: Results would suggest a successful diagnostic intervention.                  Plan:  Proceed with identical treatment, but on the opposite side.                Laboratory Chemistry  Inflammation Markers (CRP: Acute Phase) (ESR: Chronic Phase) Lab Results  Component Value Date   CRP 9.1 (H) 08/08/2017   ESRSEDRATE 16 08/08/2017   LATICACIDVEN 0.9 12/10/2017                         Renal Function Markers Lab Results  Component Value Date   BUN 25 (H) 12/11/2017   CREATININE 0.90 12/11/2017   GFRAA >60 12/11/2017   GFRNONAA >60 12/11/2017                 Hepatic Function Markers Lab Results  Component Value Date   AST 24 12/09/2017   ALT 21 12/09/2017   ALBUMIN 3.8 12/09/2017   ALKPHOS 87 12/09/2017   LIPASE 31 10/17/2017   AMMONIA 31 12/09/2017                 Electrolytes Lab Results  Component Value Date   NA 143 12/11/2017   K 3.6 12/11/2017   CL 104 12/11/2017   CALCIUM 9.3 12/11/2017   MG 2.5 (H) 12/10/2017   PHOS 5.4 (H) 12/10/2017                        Neuropathy Markers Lab Results  Component Value Date   VITAMINB12 501 08/08/2017   HIV Non Reactive 12/10/2017                 Bone Pathology Markers Lab Results  Component Value Date   25OHVITD1 13 (L) 09/17/2015   25OHVITD2 <1.0 09/17/2015   25OHVITD3 13 09/17/2015                         Coagulation Parameters Lab Results  Component Value Date   INR 1.04 12/09/2017   LABPROT 13.6 12/09/2017   APTT 36 12/09/2017   PLT 173 12/22/2017                 Cardiovascular Markers Lab Results  Component Value Date   BNP 333.4 (H) 12/10/2017   CKTOTAL 72 12/27/2012   CKMB 1.0 12/27/2012   TROPONINI <0.03 10/17/2017   HGB 13.8 12/22/2017   HCT 43.2 12/22/2017                 Note: Lab results reviewed.  Recent Diagnostic Imaging Results  DG C-Arm 1-60 Min-No Report Fluoroscopy was utilized by the  requesting physician.  No radiographic  interpretation.   Complexity Note: I personally reviewed the fluoroscopic imaging of the procedure.                        Meds   Current Outpatient Medications:  .  ADVAIR DISKUS 250-50 MCG/DOSE AEPB, Inhale 1 puff into the lungs 2 (two) times daily., Disp: , Rfl: 3 .  albuterol (PROAIR HFA) 108 (90 Base) MCG/ACT inhaler, INHALE 2 PUFFS BY MOUTH EVERY 6 HOURS AS NEEDED FOR WHEEZING, Disp: , Rfl:  .  ALPRAZolam (XANAX) 0.5 MG tablet, Take 1 tablet (0.5  mg total) by mouth 2 (two) times daily as needed for anxiety. (Patient taking differently: Take 0.25 mg by mouth 2 (two) times daily as needed for anxiety. ), Disp: 60 tablet, Rfl: 0 .  Aspirin-Caffeine (BC FAST PAIN RELIEF ARTHRITIS) 1000-65 MG PACK, Take 1 packet by mouth every 6 (six) hours as needed (Pain)., Disp: , Rfl:  .  bisoprolol-hydrochlorothiazide (ZIAC) 10-6.25 MG tablet, Take 1 tablet by mouth daily., Disp: , Rfl:  .  Cholecalciferol ([employer] D3) 2000 units CAPS, Take 1 capsule by mouth daily., Disp: , Rfl:  .  clotrimazole (MYCELEX) 10 MG troche, Take 10 mg by mouth 4 (four) times daily. Dissolve lozenge in the mouth. DO NOT SWALLOW WHOLE., Disp: , Rfl: 0 .  DALIRESP 500 MCG TABS tablet, Take 500 mcg by mouth daily., Disp: , Rfl:  .  Fluticasone-Salmeterol (ADVAIR DISKUS) 250-50 MCG/DOSE AEPB, INHALE 1 PUFF INTO LUNGS TWICE A DAY, Disp: , Rfl:  .  furosemide (LASIX) 40 MG tablet, Take 40 mg by mouth 2 (two) times daily., Disp: , Rfl:  .  gabapentin (NEURONTIN) 100 MG capsule, Take 300 mg by mouth at bedtime. , Disp: , Rfl:  .  HYDROcodone-acetaminophen (NORCO/VICODIN) 5-325 MG tablet, Take 1 tablet by mouth every 8 (eight) hours as needed for severe pain., Disp: , Rfl: 0 .  ipratropium-albuterol (DUONEB) 0.5-2.5 (3) MG/3ML SOLN, Inhale 3 mLs into the lungs every 6 (six) hours as needed for shortness of breath., Disp: , Rfl: 3 .  ketoconazole (NIZORAL) 2 % cream, Apply 1 application topically  daily., Disp: , Rfl:  .  omeprazole (PRILOSEC) 20 MG capsule, Take 20 mg by mouth daily., Disp: , Rfl: 0 .  OXYGEN, Inhale 2.5 L into the lungs continuous. Reported on 11/03/2015, Disp: , Rfl:  .  potassium chloride SA (K-DUR,KLOR-CON) 20 MEQ tablet, Take 20 mEq by mouth 2 (two) times daily., Disp: , Rfl:  .  ranitidine (ZANTAC) 150 MG tablet, Take 150 mg by mouth 2 (two) times daily., Disp: , Rfl:  .  sertraline (ZOLOFT) 100 MG tablet, Take 200 mg by mouth every morning., Disp: , Rfl: 0 .  VENTOLIN HFA 108 (90 Base) MCG/ACT inhaler, Inhale 2 puffs into the lungs every 6 (six) hours as needed for shortness of breath., Disp: , Rfl: 5  ROS  Constitutional: Denies any fever or chills Gastrointestinal: No reported hemesis, hematochezia, vomiting, or acute GI distress Musculoskeletal: Denies any acute onset joint swelling, redness, loss of ROM, or weakness Neurological: No reported episodes of acute onset apraxia, aphasia, dysarthria, agnosia, amnesia, paralysis, loss of coordination, or loss of consciousness  Allergies  Theresa [name] is allergic to codeine and meloxicam.  PFSH  Drug: Theresa [name]  reports that she does not use drugs. Alcohol:  reports that she does not drink alcohol. Tobacco:  reports that she has been smoking cigarettes.  She has a 40.00 pack-year smoking history. She has never used smokeless tobacco. Medical:  has a past medical history of Acute postoperative pain (10/03/2017), Anxiety, Carpal tunnel syndrome, CHF (congestive heart failure) ([place]), CHF (congestive heart failure) ([place]), Chronic generalized abdominal pain, Chronic rhinitis, COPD (chronic obstructive pulmonary disease) ([place]), DDD (degenerative disc disease), cervical, Depression, Edema, Flu, Gastritis, Hematuria, Hypertension, Kidney stones, Low back pain, Lumbar radiculitis, Malodorous urine, Muscle weakness, Obesity, Renal cyst, Sensory urge incontinence, and Tobacco abuse. Surgical: Theresa [name]  has a past surgical  history that includes Abdominal hysterectomy; Tonsillectomy; Carpal tunnel release (Left); and Tubal ligation. Family: family history includes Alcohol abuse  in her father; Coronary artery disease in her mother; Heart disease in her father and mother; Lung cancer in her sister; Stroke in her mother.  Constitutional Exam  General appearance: Well nourished, well developed, and well hydrated. In no apparent acute distress Vitals:   01/08/18 1417  BP: (!) 106/42  Pulse: 70  Resp: 16  Temp: 98.7 F (37.1 C)  TempSrc: Oral  SpO2: 94%  Weight: 234 lb (106.1 kg)  Height: _0  (1.6 m)   BMI Assessment: Estimated body mass index is 41.45 kg/m as calculated from the following:   Height as of this encounter: _1  (1.6 m).   Weight as of this encounter: 234 lb (106.1 kg).  BMI interpretation table: BMI level Category Range association with higher incidence of chronic pain  <18 kg/m2 Underweight   18.5-24.9 kg/m2 Ideal body weight   25-29.9 kg/m2 Overweight Increased incidence by 20%  30-34.9 kg/m2 Obese (Class I) Increased incidence by 68%  35-39.9 kg/m2 Severe obesity (Class II) Increased incidence by 136%  >40 kg/m2 Extreme obesity (Class III) Increased incidence by 254%   BMI Readings from Last 4 Encounters:  01/08/18 41.45 kg/m  12/26/17 41.98 kg/m  12/11/17 42.33 kg/m  12/04/17 42.16 kg/m   Wt Readings from Last 4 Encounters:  01/08/18 234 lb (106.1 kg)  12/26/17 237 lb (107.5 kg)  12/11/17 238 lb 15.7 oz (108.4 kg)  12/04/17 238 lb (108 kg)  Psych/Mental status: Alert, oriented x 3 (person, place, & time)       Eyes: PERLA Respiratory: Oxygen-dependent COPD.  The patient indicates that she is still smoking despite the fact that she has on oxygen dependent COPD.  Cervical Spine Area Exam  Skin & Axial Inspection: No masses, redness, edema, swelling, or associated skin lesions Alignment: Symmetrical Functional ROM: Unrestricted ROM      Stability: No instability  detected Muscle Tone/Strength: Functionally intact. No obvious neuro-muscular anomalies detected. Sensory (Neurological): Unimpaired Palpation: No palpable anomalies              Upper Extremity (UE) Exam    Side: Right upper extremity  Side: Left upper extremity  Skin & Extremity Inspection: Skin color, temperature, and hair growth are WNL. No peripheral edema or cyanosis. No masses, redness, swelling, asymmetry, or associated skin lesions. No contractures.  Skin & Extremity Inspection: Skin color, temperature, and hair growth are WNL. No peripheral edema or cyanosis. No masses, redness, swelling, asymmetry, or associated skin lesions. No contractures.  Functional ROM: Unrestricted ROM          Functional ROM: Unrestricted ROM          Muscle Tone/Strength: Functionally intact. No obvious neuro-muscular anomalies detected.  Muscle Tone/Strength: Functionally intact. No obvious neuro-muscular anomalies detected.  Sensory (Neurological): Unimpaired          Sensory (Neurological): Unimpaired          Palpation: No palpable anomalies              Palpation: No palpable anomalies              Specialized Test(s): Deferred         Specialized Test(s): Deferred          Thoracic Spine Area Exam  Skin & Axial Inspection: No masses, redness, or swelling Alignment: Symmetrical Functional ROM: Unrestricted ROM Stability: No instability detected Muscle Tone/Strength: Functionally intact. No obvious neuro-muscular anomalies detected. Sensory (Neurological): Unimpaired Muscle strength & Tone: No palpable anomalies  Lumbar Spine Area Exam  Skin & Axial Inspection: No masses, redness, or swelling Alignment: Symmetrical Functional ROM: Decreased ROM Improved after radiofrequencies. Stability: No instability detected Muscle Tone/Strength: Functionally intact. No obvious neuro-muscular anomalies detected. Sensory (Neurological): Unimpaired Palpation: Uncomfortable       Provocative Tests: Lumbar  Hyperextension and rotation test: Improved after treatment       Lumbar Lateral bending test: evaluation deferred today       Patrick's Maneuver: evaluation deferred today                    Gait & Posture Assessment  Ambulation: Patient ambulates using a wheel chair Gait: Very limited, using assistive device to ambulate.  However, she is using the wheelchair primarily because of her shortness of breath due to her COPD and morbid obesity. Posture: Antalgic   Lower Extremity Exam    Side: Right lower extremity  Side: Left lower extremity  Skin & Extremity Inspection: Skin color, temperature, and hair growth are WNL. No peripheral edema or cyanosis. No masses, redness, swelling, asymmetry, or associated skin lesions. No contractures.  Skin & Extremity Inspection: Skin color, temperature, and hair growth are WNL. No peripheral edema or cyanosis. No masses, redness, swelling, asymmetry, or associated skin lesions. No contractures.  Functional ROM: Unrestricted ROM          Functional ROM: Unrestricted ROM          Muscle Tone/Strength: Functionally intact. No obvious neuro-muscular anomalies detected.  Muscle Tone/Strength: Functionally intact. No obvious neuro-muscular anomalies detected.  Sensory (Neurological): Unimpaired  Sensory (Neurological): Unimpaired  Palpation: No palpable anomalies  Palpation: No palpable anomalies   Assessment  Primary Diagnosis & Pertinent Problem List: The primary encounter diagnosis was DDD (degenerative disc disease), lumbar. Diagnoses of Spinal stenosis of lumbar region with neurogenic claudication, Lumbar spondylosis (L4-5), and Lumbosacral radiculopathy at L4 were also pertinent to this visit.  Status Diagnosis  Controlled Controlled Controlled 1. DDD (degenerative disc disease), lumbar   2. Spinal stenosis of lumbar region with neurogenic claudication   3. Lumbar spondylosis (L4-5)   4. Lumbosacral radiculopathy at L4     Problems updated and reviewed  during this visit: Problem  Altered Mental Status, Unspecified  Acute Respiratory Failure (Hcc)   Plan of Care  Pharmacotherapy (Medications Ordered): No orders of the defined types were placed in this encounter.  Medications administered today: Theresa [name] had no medications administered during this visit.   Procedure Orders     Lumbar Epidural Injection     Lumbar Transforaminal Epidural Lab Orders  No laboratory test(s) ordered today   Imaging Orders  No imaging studies ordered today   Referral Orders  No referral(s) requested today    Interventional management options: Planned, scheduled, and/or pending:   Diagnostic/therapeutic right L4-5 interlaminar LESI + right L4 transforaminal LESI #2     Considering:   Diagnostic bilateral lumbar facet block Possible left lumbar facet RFA(right side done on 10/03/2017: Left side done on 06/27/2017) Therapeutic left L4-5 interlaminar LESI + left L4 TFESI    Palliative PRN treatment(s):   Palliative left sided lumbar facetblock Palliative left-sided L4-5 interlaminar LESI + left-sided L4 TFESI    Provider-requested follow-up: Return for Procedure (w/ sedation): (R) L4-5 LESI .  Future Appointments  Date Time Provider [place]  01/31/2018  2:30 PM Vevelyn Francois, NP [facility] None  03/14/2018  1:20 PM [name], FNP [facility] None  04/10/2018  3:00 PM Harlin Heys, MD [facility] None   Primary Care  Physician: Margo Common, PA Location: [facility] Outpatient Pain Management Facility Note by: Gaspar Cola, MD Date: 01/08/2018; Time: 3:01 PM

## 2018-01-15 ENCOUNTER — Other Ambulatory Visit: Payer: Self-pay | Admitting: Family Medicine

## 2018-01-15 MED ORDER — SERTRALINE HCL 100 MG PO TABS: 200.0000 mg | ORAL_TABLET | ORAL | 3 refills | Status: DC

## 2018-01-15 NOTE — Telephone Encounter (Signed)
Pt needs a new refill on her sertraline 100 mg.  She uses [facility] In [place]  Her call back Is [phone]  Thanks [employer]

## 2018-01-18 ENCOUNTER — Ambulatory Visit (INDEPENDENT_AMBULATORY_CARE_PROVIDER_SITE_OTHER): Payer: Medicare HMO

## 2018-01-18 VITALS — BP 124/58 | HR 64 | Temp 99.6°F | Ht 63.0 in | Wt 236.4 lb

## 2018-01-18 DIAGNOSIS — Z0000 Encounter for general adult medical examination without abnormal findings: Secondary | ICD-10-CM

## 2018-01-18 NOTE — Progress Notes (Addendum)
Subjective:   Theresa [name] is a 62 y.o. female who presents for an Initial Medicare Annual Wellness Visit.  Review of Systems    N/A        Objective:    There were no vitals filed for this visit. There is no height or weight on file to calculate BMI.  Advanced Directives 12/09/2017 11/08/2017 10/17/2017 10/03/2017 08/08/2017 08/08/2017 07/27/2017  Does Patient Have a Medical Advance Directive? _0  No No  Type of Advance Directive - - - - - - -  Does patient want to make changes to medical advance directive? - - - - - - -  Copy of [place] in Chart? - - - - - - -  Would patient like information on creating a medical advance directive? No - Patient declined No - Patient declined - No - Patient declined - - -    Current Medications (verified) Outpatient Encounter Medications as of 01/18/2018  Medication Sig  . ADVAIR DISKUS 250-50 MCG/DOSE AEPB Inhale 1 puff into the lungs 2 (two) times daily.  Marland Kitchen albuterol (PROAIR HFA) 108 (90 Base) MCG/ACT inhaler INHALE 2 PUFFS BY MOUTH EVERY 6 HOURS AS NEEDED FOR WHEEZING  . ALPRAZolam (XANAX) 0.5 MG tablet Take 1 tablet (0.5 mg total) by mouth 2 (two) times daily as needed for anxiety. (Patient taking differently: Take 0.25 mg by mouth 2 (two) times daily as needed for anxiety. )  . Aspirin-Caffeine (BC FAST PAIN RELIEF ARTHRITIS) 1000-65 MG PACK Take 1 packet by mouth every 6 (six) hours as needed (Pain).  . bisoprolol-hydrochlorothiazide (ZIAC) 10-6.25 MG tablet Take 1 tablet by mouth daily.  . Cholecalciferol ([employer] D3) 2000 units CAPS Take 1 capsule by mouth daily.  . clotrimazole (MYCELEX) 10 MG troche Take 10 mg by mouth 4 (four) times daily. Dissolve lozenge in the mouth. DO NOT SWALLOW WHOLE.  Marland Kitchen DALIRESP 500 MCG TABS tablet Take 500 mcg by mouth daily.  . Fluticasone-Salmeterol (ADVAIR DISKUS) 250-50 MCG/DOSE AEPB INHALE 1 PUFF INTO LUNGS TWICE A DAY  . furosemide (LASIX) 40 MG tablet Take 40 mg by mouth 2  (two) times daily.  Marland Kitchen gabapentin (NEURONTIN) 100 MG capsule Take 300 mg by mouth at bedtime.   Marland Kitchen HYDROcodone-acetaminophen (NORCO/VICODIN) 5-325 MG tablet Take 1 tablet by mouth every 8 (eight) hours as needed for severe pain.  Marland Kitchen ipratropium-albuterol (DUONEB) 0.5-2.5 (3) MG/3ML SOLN Inhale 3 mLs into the lungs every 6 (six) hours as needed for shortness of breath.  . ketoconazole (NIZORAL) 2 % cream Apply 1 application topically daily.  Marland Kitchen omeprazole (PRILOSEC) 20 MG capsule Take 20 mg by mouth daily.  . OXYGEN Inhale 2.5 L into the lungs continuous. Reported on 11/03/2015  . potassium chloride SA (K-DUR,KLOR-CON) 20 MEQ tablet Take 20 mEq by mouth 2 (two) times daily.  . ranitidine (ZANTAC) 150 MG tablet Take 150 mg by mouth 2 (two) times daily.  . sertraline (ZOLOFT) 100 MG tablet Take 2 tablets (200 mg total) by mouth every morning.  . VENTOLIN HFA 108 (90 Base) MCG/ACT inhaler Inhale 2 puffs into the lungs every 6 (six) hours as needed for shortness of breath.   No facility-administered encounter medications on file as of 01/18/2018.     Allergies (verified) Codeine and Meloxicam   History: Past Medical History:  Diagnosis Date  . Acute postoperative pain 10/03/2017  . Anxiety   . Carpal tunnel syndrome   . CHF (congestive heart failure) ([place])  1/18  . CHF (congestive heart failure) ([place])   . Chronic generalized abdominal pain   . Chronic rhinitis   . COPD (chronic obstructive pulmonary disease) ([place])   . DDD (degenerative disc disease), cervical   . Depression   . Edema   . Flu    1/18  . Gastritis   . Hematuria   . Hypertension   . Kidney stones   . Low back pain   . Lumbar radiculitis   . Malodorous urine   . Muscle weakness   . Obesity   . Renal cyst   . Sensory urge incontinence   . Tobacco abuse    Past Surgical History:  Procedure Laterality Date  . ABDOMINAL HYSTERECTOMY    . CARPAL TUNNEL RELEASE Left   . TONSILLECTOMY    . TUBAL LIGATION     Family  History  Problem Relation Age of Onset  . Heart disease Mother   . Stroke Mother   . Coronary artery disease Mother   . Lung cancer Sister   . Alcohol abuse Father   . Heart disease Father   . Prostate cancer Neg Hx   . Kidney cancer Neg Hx   . Bladder Cancer Neg Hx    Social History   Socioeconomic History  . Marital status: Married    Spouse name: Not on file  . Number of children: Not on file  . Years of education: Not on file  . Highest education level: 12th grade  Occupational History  . Not on file  Social Needs  . Financial resource strain: Not hard at all  . Food insecurity:    Worry: Sometimes true    Inability: Sometimes true  . Transportation needs:    Medical: No    Non-medical: No  Tobacco Use  . Smoking status: Current Every Day Smoker    Packs/day: 1.00    Years: 40.00    Pack years: 40.00    Types: Cigarettes  . Smokeless tobacco: Never Used  . Tobacco comment: first day of patch patient states she's trying  Substance and Sexual Activity  . Alcohol use: No    Frequency: Never  . Drug use: No  . Sexual activity: Never    Birth control/protection: Surgical  Lifestyle  . Physical activity:    Days per week: 0 days    Minutes per session: 0 min  . Stress: Very much  Relationships  . Social connections:    Talks on phone: More than three times a week    Gets together: Twice a week    Attends religious service: More than 4 times per year    Active member of club or organization: Yes    Attends meetings of clubs or organizations: More than 4 times per year    Relationship status: Married  Other Topics Concern  . Not on file  Social History Narrative   ** Merged History Encounter **       Lives with her daughter    Tobacco Counseling Ready to quit: Not Answered Counseling given: Not Answered Comment: first day of patch patient states she's trying   Clinical Intake:  Activities of Daily Living In your present state of health, do you  have any difficulty performing the following activities: 11/08/2017 08/08/2017  Hearing? N N  Vision? Y N  Difficulty concentrating or making decisions? Tempie Donning  Walking or climbing stairs? Y -  Dressing or bathing? N N  Doing errands, shopping? Tempie Donning  Some  recent data might be hidden     Immunizations and Health Maintenance Immunization History  Administered Date(s) Administered  . Influenza Split 09/02/2014  . Influenza,inj,Quad PF,6+ Mos 07/01/2015, 08/24/2017  . Influenza-Unspecified 08/29/2016  . Pneumococcal Polysaccharide-23 07/01/2015, 11/03/2016  . Pneumococcal-Unspecified 07/24/2011   There are no preventive care reminders to display for this patient.  Patient Care Team: Chrismon, Vickki Muff, PA as PCP - General (Family Medicine) [name], FNP as Nurse Practitioner (Cardiology) Yolonda Kida, MD as Consulting Physician (Cardiology) Erby Pian, MD as Referring Physician (Pulmonary Disease)  Indicate any recent Medical Services you may have received from other than [facility] providers in the past year (date may be approximate).     Assessment:   This is a routine wellness examination for [facility].  Hearing/Vision screen No exam data present  Dietary issues and exercise activities discussed:    Goals    None     Depression Screen PHQ 2/9 Scores 12/26/2017 11/08/2017 11/08/2017 10/03/2017 08/08/2017 08/08/2017 07/27/2017  PHQ - 2 Score 0 0 1 0 4 1 0  PHQ- 9 Score - - - - 15 - -  Exception Documentation - - - - - - -    Fall Risk Fall Risk  01/08/2018 12/26/2017 11/27/2017 11/08/2017 11/08/2017  Falls in the past year? No No No - Yes  Comment - - - - -  Number falls in past yr: - - - 2 or more 2 or more  Injury with Fall? - - - - No  Comment - - - - -  Risk Factor Category  - - - - -  Risk for fall due to : - - - History of fall(s) -  Follow up - - - - -    Is the patient's home free of loose throw rugs in walkways, pet beds, electrical cords, etc?    yes      Grab bars in the bathroom? yes      Handrails on the stairs?   yes      Adequate lighting?   yes  Timed Get Up and Go Performed N/A  Cognitive Function:        Screening Tests Health Maintenance  Topic Date Due  . TETANUS/TDAP  12/04/2018 (Originally 05/22/1975)  . INFLUENZA VACCINE  05/17/2018  . MAMMOGRAM  05/24/2019  . PAP SMEAR  04/04/2020  . COLONOSCOPY  01/22/2024  . Hepatitis C Screening  Completed  . HIV Screening  Completed    Qualifies for Shingles Vaccine? Due for Shingles vaccine. Declined my offer to administer today. Education has been provided regarding the importance of this vaccine. Pt has been advised to call her insurance company to determine her out of pocket expense. Advised she may also receive this vaccine at her local pharmacy or Health Dept. Verbalized acceptance and understanding.  Cancer Screenings: Lung: Low Dose CT Chest recommended if Age 67-80 years, 30 pack-year currently smoking OR have quit w/in 15years. Patient does qualify. An Epic message has been sent to Burgess Estelle, RN (Oncology Nurse Navigator) regarding the possible need for this exam. Raquel Sarna will review the patient's chart to determine if the patient truly qualifies for the exam. If the patient qualifies, Raquel Sarna will order the Low Dose CT of the chest to facilitate the scheduling of this exam. Breast: Up to date on Mammogram? Yes   Up to date of Bone Density/Dexa? Yes Colorectal: Up to date  Additional Screenings:  Hepatitis C Screening: Up to date   Plan:  I have personally reviewed and addressed the Medicare Annual Wellness questionnaire and have noted the following in the patient's chart:  A. Medical and social history B. Use of alcohol, tobacco or illicit drugs  C. Current medications and supplements D. Functional ability and status E.  Nutritional status F.  Physical activity G. Advance directives H. List of other physicians I.  Hospitalizations, surgeries, and ER  visits in previous 12 months J.  [place] such as hearing and vision if needed, cognitive and depression L. Referrals and appointments - none  In addition, I have reviewed and discussed with patient certain preventive protocols, quality metrics, and best practice recommendations. A written personalized care plan for preventive services as well as general preventive health recommendations were provided to patient.  See attached scanned questionnaire for additional information.   Signed,  Fabio Neighbors, LPN Nurse Health Advisor   Nurse Recommendations: A message was sent to Burgess Estelle to check eligibilty for a chest CT scan due to smoking history. Pt declined the tetanus vaccine today.   Reviewed documentation and recommendations of Nurse Health advisor. Agree with recommendations.

## 2018-01-18 NOTE — Patient Instructions (Addendum)
Theresa [name] , Thank you for taking time to come for your Medicare Wellness Visit. I appreciate your ongoing commitment to your health goals. Please review the following plan we discussed and let me know if I can assist you in the future.   Screening recommendations/referrals: Colonoscopy: Up to date Mammogram: Up to date Bone Density: Up to date Recommended yearly ophthalmology/optometry visit for glaucoma screening and checkup Recommended yearly dental visit for hygiene and checkup  Vaccinations: Influenza vaccine: Up to date Pneumococcal vaccine: N/A Tdap vaccine: Pt declines today.  Shingles vaccine: Pt declines today.     Advanced directives: Advance directive discussed with you today. Even though you declined this today please call our office should you change your mind and we can give you the proper paperwork for you to fill out.  Conditions/risks identified: Fall risk prevention; Obesity- recommend current diet plan of eating baked foods, avoiding sugars and drinking lots of water.   Next appointment: Pt declines setting up a CPE with Simona Huh at this time. AWV set up for 01/21/19.  Preventive Care 40-64 Years, Female Preventive care refers to lifestyle choices and visits with your health care provider that can promote health and wellness. What does preventive care include?  A yearly physical exam. This is also called an annual well check.  Dental exams once or twice a year.  Routine eye exams. Ask your health care provider how often you should have your eyes checked.  Personal lifestyle choices, including:  Daily care of your teeth and gums.  Regular physical activity.  Eating a healthy diet.  Avoiding tobacco and drug use.  Limiting alcohol use.  Practicing safe sex.  Taking low-dose aspirin daily starting at age 50.  Taking vitamin and mineral supplements as recommended by your health care provider. What happens during an annual well check? The services and  screenings done by your health care provider during your annual well check will depend on your age, overall health, lifestyle risk factors, and family history of disease. Counseling  Your health care provider may ask you questions about your:  Alcohol use.  Tobacco use.  Drug use.  Emotional well-being.  Home and relationship well-being.  Sexual activity.  Eating habits.  Work and work Statistician.  Method of birth control.  Menstrual cycle.  Pregnancy history. Screening  You may have the following tests or measurements:  Height, weight, and BMI.  Blood pressure.  Lipid and cholesterol levels. These may be checked every 5 years, or more frequently if you are over 31 years old.  Skin check.  Lung cancer screening. You may have this screening every year starting at age 72 if you have a 30-pack-year history of smoking and currently smoke or have quit within the past 15 years.  Fecal occult blood test (FOBT) of the stool. You may have this test every year starting at age 65.  Flexible sigmoidoscopy or colonoscopy. You may have a sigmoidoscopy every 5 years or a colonoscopy every 10 years starting at age 15.  Hepatitis C blood test.  Hepatitis B blood test.  Sexually transmitted disease (STD) testing.  Diabetes screening. This is done by checking your blood sugar (glucose) after you have not eaten for a while (fasting). You may have this done every 1-3 years.  Mammogram. This may be done every 1-2 years. Talk to your health care provider about when you should start having regular mammograms. This may depend on whether you have a family history of breast cancer.  BRCA-related cancer screening.  This may be done if you have a family history of breast, ovarian, tubal, or peritoneal cancers.  Pelvic exam and Pap test. This may be done every 3 years starting at age 7. Starting at age 35, this may be done every 5 years if you have a Pap test in combination with an HPV  test.  Bone density scan. This is done to screen for osteoporosis. You may have this scan if you are at high risk for osteoporosis. Discuss your test results, treatment options, and if necessary, the need for more tests with your health care provider. Vaccines  Your health care provider may recommend certain vaccines, such as:  Influenza vaccine. This is recommended every year.  Tetanus, diphtheria, and acellular pertussis (Tdap, Td) vaccine. You may need a Td booster every 10 years.  Zoster vaccine. You may need this after age 21.  Pneumococcal 13-valent conjugate (PCV13) vaccine. You may need this if you have certain conditions and were not previously vaccinated.  Pneumococcal polysaccharide (PPSV23) vaccine. You may need one or two doses if you smoke cigarettes or if you have certain conditions. Talk to your health care provider about which screenings and vaccines you need and how often you need them. This information is not intended to replace advice given to you by your health care provider. Make sure you discuss any questions you have with your health care provider. Document Released: 10/30/2015 Document Revised: 06/22/2016 Document Reviewed: 08/04/2015 Elsevier Interactive Patient Education  2017 [place] Prevention in the Home Falls can cause injuries. They can happen to people of all ages. There are many things you can do to make your home safe and to help prevent falls. What can I do on the outside of my home?  Regularly fix the edges of walkways and driveways and fix any cracks.  Remove anything that might make you trip as you walk through a door, such as a raised step or threshold.  Trim any bushes or trees on the path to your home.  Use bright outdoor lighting.  Clear any walking paths of anything that might make someone trip, such as rocks or tools.  Regularly check to see if handrails are loose or broken. Make sure that both sides of any steps have  handrails.  Any raised decks and porches should have guardrails on the edges.  Have any leaves, snow, or ice cleared regularly.  Use sand or salt on walking paths during winter.  Clean up any spills in your garage right away. This includes oil or grease spills. What can I do in the bathroom?  Use night lights.  Install grab bars by the toilet and in the tub and shower. Do not use towel bars as grab bars.  Use non-skid mats or decals in the tub or shower.  If you need to sit down in the shower, use a plastic, non-slip stool.  Keep the floor dry. Clean up any water that spills on the floor as soon as it happens.  Remove soap buildup in the tub or shower regularly.  Attach bath mats securely with double-sided non-slip rug tape.  Do not have throw rugs and other things on the floor that can make you trip. What can I do in the bedroom?  Use night lights.  Make sure that you have a light by your bed that is easy to reach.  Do not use any sheets or blankets that are too big for your bed. They should not hang  down onto the floor.  Have a firm chair that has side arms. You can use this for support while you get dressed.  Do not have throw rugs and other things on the floor that can make you trip. What can I do in the kitchen?  Clean up any spills right away.  Avoid walking on wet floors.  Keep items that you use a lot in easy-to-reach places.  If you need to reach something above you, use a strong step stool that has a grab bar.  Keep electrical cords out of the way.  Do not use floor polish or wax that makes floors slippery. If you must use wax, use non-skid floor wax.  Do not have throw rugs and other things on the floor that can make you trip. What can I do with my stairs?  Do not leave any items on the stairs.  Make sure that there are handrails on both sides of the stairs and use them. Fix handrails that are broken or loose. Make sure that handrails are as long as  the stairways.  Check any carpeting to make sure that it is firmly attached to the stairs. Fix any carpet that is loose or worn.  Avoid having throw rugs at the top or bottom of the stairs. If you do have throw rugs, attach them to the floor with carpet tape.  Make sure that you have a light switch at the top of the stairs and the bottom of the stairs. If you do not have them, ask someone to add them for you. What else can I do to help prevent falls?  Wear shoes that:  Do not have high heels.  Have rubber bottoms.  Are comfortable and fit you well.  Are closed at the toe. Do not wear sandals.  If you use a stepladder:  Make sure that it is fully opened. Do not climb a closed stepladder.  Make sure that both sides of the stepladder are locked into place.  Ask someone to hold it for you, if possible.  Clearly mark and make sure that you can see:  Any grab bars or handrails.  First and last steps.  Where the edge of each step is.  Use tools that help you move around (mobility aids) if they are needed. These include:  Canes.  Walkers.  Scooters.  Crutches.  Turn on the lights when you go into a dark area. Replace any light bulbs as soon as they burn out.  Set up your furniture so you have a clear path. Avoid moving your furniture around.  If any of your floors are uneven, fix them.  If there are any pets around you, be aware of where they are.  Review your medicines with your doctor. Some medicines can make you feel dizzy. This can increase your chance of falling. Ask your doctor what other things that you can do to help prevent falls. This information is not intended to replace advice given to you by your health care provider. Make sure you discuss any questions you have with your health care provider. Document Released: 07/30/2009 Document Revised: 03/10/2016 Document Reviewed: 11/07/2014 Elsevier Interactive Patient Education  2017 [employer].

## 2018-01-19 ENCOUNTER — Telehealth: Payer: Self-pay | Admitting: *Deleted

## 2018-01-19 DIAGNOSIS — Z122 Encounter for screening for malignant neoplasm of respiratory organs: Secondary | ICD-10-CM

## 2018-01-19 DIAGNOSIS — Z87891 Personal history of nicotine dependence: Secondary | ICD-10-CM

## 2018-01-19 NOTE — Telephone Encounter (Signed)
Received referral for initial lung cancer screening scan. Contacted patient and obtained smoking history,(current, 44 pack year) as well as answering questions related to screening process. Patient denies signs of lung cancer such as weight loss or hemoptysis. Patient denies comorbidity that would prevent curative treatment if lung cancer were found. Patient is scheduled for shared decision making visit and CT scan on 02/01/18.

## 2018-01-20 DIAGNOSIS — J9610 Chronic respiratory failure, unspecified whether with hypoxia or hypercapnia: Secondary | ICD-10-CM | POA: Diagnosis not present

## 2018-01-24 NOTE — Telephone Encounter (Signed)
Completed on 01/18/18. -MM

## 2018-01-25 ENCOUNTER — Ambulatory Visit (HOSPITAL_BASED_OUTPATIENT_CLINIC_OR_DEPARTMENT_OTHER): Payer: Medicare HMO | Admitting: Pain Medicine

## 2018-01-25 ENCOUNTER — Encounter: Payer: Self-pay | Admitting: Pain Medicine

## 2018-01-25 ENCOUNTER — Ambulatory Visit
Admission: RE | Admit: 2018-01-25 | Discharge: 2018-01-25 | Disposition: A | Discharge status: Home / Self Care | Payer: Medicare HMO | Source: Ambulatory Visit | Attending: Pain Medicine | Admitting: Pain Medicine

## 2018-01-25 ENCOUNTER — Other Ambulatory Visit: Payer: Self-pay

## 2018-01-25 VITALS — BP 95/51 | HR 72 | Temp 97.7°F | Resp 23 | Ht 63.0 in | Wt 236.0 lb

## 2018-01-25 DIAGNOSIS — G8929 Other chronic pain: Secondary | ICD-10-CM | POA: Insufficient documentation

## 2018-01-25 DIAGNOSIS — M5136 Other intervertebral disc degeneration, lumbar region: Secondary | ICD-10-CM | POA: Diagnosis not present

## 2018-01-25 DIAGNOSIS — M79605 Pain in left leg: Secondary | ICD-10-CM | POA: Diagnosis not present

## 2018-01-25 DIAGNOSIS — M48062 Spinal stenosis, lumbar region with neurogenic claudication: Secondary | ICD-10-CM | POA: Diagnosis not present

## 2018-01-25 DIAGNOSIS — M5417 Radiculopathy, lumbosacral region: Secondary | ICD-10-CM | POA: Diagnosis not present

## 2018-01-25 DIAGNOSIS — M79604 Pain in right leg: Secondary | ICD-10-CM | POA: Insufficient documentation

## 2018-01-25 DIAGNOSIS — M545 Low back pain: Secondary | ICD-10-CM | POA: Diagnosis present

## 2018-01-25 DIAGNOSIS — Z7951 Long term (current) use of inhaled steroids: Secondary | ICD-10-CM | POA: Diagnosis not present

## 2018-01-25 DIAGNOSIS — Z79899 Other long term (current) drug therapy: Secondary | ICD-10-CM | POA: Insufficient documentation

## 2018-01-25 DIAGNOSIS — M47816 Spondylosis without myelopathy or radiculopathy, lumbar region: Secondary | ICD-10-CM

## 2018-01-25 MED ORDER — LACTATED RINGERS IV SOLN
1000.0000 mL | Freq: Once | INTRAVENOUS | Status: AC
  Administered 2018-01-25: 1000 mL via INTRAVENOUS

## 2018-01-25 MED ORDER — ROPIVACAINE HCL 2 MG/ML IJ SOLN
2.0000 mL | Freq: Once | INTRAMUSCULAR | Status: AC
  Administered 2018-01-25: 10 mL via EPIDURAL
  Filled 2018-01-25: qty 10

## 2018-01-25 MED ORDER — ROPIVACAINE HCL 2 MG/ML IJ SOLN
1.0000 mL | Freq: Once | INTRAMUSCULAR | Status: AC
  Administered 2018-01-25: 10 mL via EPIDURAL
  Filled 2018-01-25: qty 10

## 2018-01-25 MED ORDER — LIDOCAINE HCL 2 % IJ SOLN
20.0000 mL | Freq: Once | INTRAMUSCULAR | Status: AC
  Administered 2018-01-25: 400 mg
  Filled 2018-01-25: qty 20

## 2018-01-25 MED ORDER — FENTANYL CITRATE (PF) 100 MCG/2ML IJ SOLN
25.0000 ug | INTRAMUSCULAR | Status: DC | PRN
  Administered 2018-01-25: 50 ug via INTRAVENOUS
  Filled 2018-01-25: qty 2

## 2018-01-25 MED ORDER — IOPAMIDOL (ISOVUE-M 200) INJECTION 41%
10.0000 mL | Freq: Once | INTRAMUSCULAR | Status: AC
  Administered 2018-01-25: 10 mL via EPIDURAL
  Filled 2018-01-25: qty 10

## 2018-01-25 MED ORDER — DEXAMETHASONE SODIUM PHOSPHATE 10 MG/ML IJ SOLN
10.0000 mg | Freq: Once | INTRAMUSCULAR | Status: AC
  Administered 2018-01-25: 10 mg
  Filled 2018-01-25: qty 1

## 2018-01-25 MED ORDER — MIDAZOLAM HCL 5 MG/5ML IJ SOLN
1.0000 mg | INTRAMUSCULAR | Status: DC | PRN
  Administered 2018-01-25: 1 mg via INTRAVENOUS
  Filled 2018-01-25: qty 5

## 2018-01-25 MED ORDER — SODIUM CHLORIDE 0.9% FLUSH
1.0000 mL | Freq: Once | INTRAVENOUS | Status: AC
  Administered 2018-01-25: 10 mL

## 2018-01-25 MED ORDER — TRIAMCINOLONE ACETONIDE 40 MG/ML IJ SUSP
40.0000 mg | Freq: Once | INTRAMUSCULAR | Status: AC
  Administered 2018-01-25: 40 mg
  Filled 2018-01-25: qty 1

## 2018-01-25 MED ORDER — SODIUM CHLORIDE 0.9% FLUSH
2.0000 mL | Freq: Once | INTRAVENOUS | Status: AC
  Administered 2018-01-25: 10 mL

## 2018-01-25 NOTE — Progress Notes (Signed)
Patient's Name: Theresa [name]  MRN: [id number]  Referring Provider: Milinda Pointer, MD  DOB: [birth year]  PCP: Margo Common, PA  DOS: 01/25/2018  Note by: Gaspar Cola, MD  Service setting: Ambulatory outpatient  Specialty: Interventional Pain Management  Patient type: Established  Location: [facility] (AMB) Pain Management Facility  Visit type: Interventional Procedure   Primary Reason for Visit: Interventional Pain Management Treatment. CC: Back Pain (low)  Procedure:  Anesthesia, Analgesia, Anxiolysis:  Procedure #1: Type: Diagnostic Inter-Laminar Epidural Steroid Injection  #2  Region: Lumbar Level: L4-5 Level. Laterality: Right-Sided Paramedial  Procedure #2: Type: Diagnostic Trans-Foraminal Epidural Steroid Injection  #2  Region: Lumbar Level: L4 Paravertebral Laterality: Right-Sided Paravertebral Position: Prone  Type: Moderate (Conscious) Sedation combined with Local Anesthesia Indication(s): Analgesia and Anxiety Route: Intravenous (IV) IV Access: Secured Sedation: Meaningful verbal contact was maintained at all times during the procedure  Local Anesthetic: Lidocaine 1-2%   Indications: 1. DDD (degenerative disc disease), lumbar   2. Spinal stenosis of lumbar region with neurogenic claudication   3. Lumbosacral radiculopathy at L4   4. Chronic lower extremity pain (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)   5. Lumbar spondylosis (L4-5)   6. Chronic pain of lower extremity, bilateral    Pain Score: Pre-procedure: 5 /10 Post-procedure: 0-No pain/10  Pre-op Assessment:  Theresa [name] is a 62 y.o. (year old), female patient, seen today for interventional treatment. She  has a past surgical history that includes Abdominal hysterectomy; Tonsillectomy; Carpal tunnel release (Left); and Tubal ligation. Theresa [name] has a current medication list which includes the following prescription(s): advair diskus, albuterol, alprazolam, aspirin-caffeine, bisoprolol-hydrochlorothiazide, cholecalciferol,  daliresp, fluticasone-salmeterol, furosemide, gabapentin, hydrocodone-acetaminophen, ipratropium-albuterol, oxygen-helium, potassium chloride sa, ranitidine, sertraline, and ventolin hfa, and the following Facility-Administered Medications: fentanyl, lactated ringers, and midazolam. Her primarily concern today is the Back Pain (low)  Initial Vital Signs:  Pulse Rate: 72 Temp: 98.1 F (36.7 C) Resp: 18 BP: 106/60 SpO2: 95 %  BMI: Estimated body mass index is 41.81 kg/m as calculated from the following:   Height as of this encounter: _0  (1.6 m).   Weight as of this encounter: 236 lb (107 kg).  Risk Assessment: Allergies: Reviewed. She is allergic to codeine and meloxicam.  Allergy Precautions: None required Coagulopathies: Reviewed. None identified.  Blood-thinner therapy: None at this time Active Infection(s): Reviewed. None identified. Theresa [name] is afebrile  Site Confirmation: Theresa [name] was asked to confirm the procedure and laterality before marking the site Procedure checklist: Completed Consent: Before the procedure and under the influence of no sedative(s), amnesic(s), or anxiolytics, the patient was informed of the treatment options, risks and possible complications. To fulfill our ethical and legal obligations, as recommended by the American Medical Association's Code of Ethics, I have informed the patient of my clinical impression; the nature and purpose of the treatment or procedure; the risks, benefits, and possible complications of the intervention; the alternatives, including doing nothing; the risk(s) and benefit(s) of the alternative treatment(s) or procedure(s); and the risk(s) and benefit(s) of doing nothing. The patient was provided information about the general risks and possible complications associated with the procedure. These may include, but are not limited to: failure to achieve desired goals, infection, bleeding, organ or nerve damage, allergic reactions,  paralysis, and death. In addition, the patient was informed of those risks and complications associated to Spine-related procedures, such as failure to decrease pain; infection (i.e.: Meningitis, epidural or intraspinal abscess); bleeding (i.e.: epidural hematoma, subarachnoid hemorrhage, or any other type of intraspinal or peri-dural bleeding);  organ or nerve damage (i.e.: Any type of peripheral nerve, nerve root, or spinal cord injury) with subsequent damage to sensory, motor, and/or autonomic systems, resulting in permanent pain, numbness, and/or weakness of one or several areas of the body; allergic reactions; (i.e.: anaphylactic reaction); and/or death. Furthermore, the patient was informed of those risks and complications associated with the medications. These include, but are not limited to: allergic reactions (i.e.: anaphylactic or anaphylactoid reaction(s)); adrenal axis suppression; blood sugar elevation that in diabetics may result in ketoacidosis or comma; water retention that in patients with history of congestive heart failure may result in shortness of breath, pulmonary edema, and decompensation with resultant heart failure; weight gain; swelling or edema; medication-induced neural toxicity; particulate matter embolism and blood vessel occlusion with resultant organ, and/or nervous system infarction; and/or aseptic necrosis of one or more joints. Finally, the patient was informed that Medicine is not an exact science; therefore, there is also the possibility of unforeseen or unpredictable risks and/or possible complications that may result in a catastrophic outcome. The patient indicated having understood very clearly. We have given the patient no guarantees and we have made no promises. Enough time was given to the patient to ask questions, all of which were answered to the patient's satisfaction. Theresa [name] has indicated that she wanted to continue with the procedure. Attestation: I, the  ordering provider, attest that I have discussed with the patient the benefits, risks, side-effects, alternatives, likelihood of achieving goals, and potential problems during recovery for the procedure that I have provided informed consent. Date  Time: 01/25/2018  9:52 AM  Pre-Procedure Preparation:  Monitoring: As per clinic protocol. Respiration, ETCO2, SpO2, BP, heart rate and rhythm monitor placed and checked for adequate function Safety Precautions: Patient was assessed for positional comfort and pressure points before starting the procedure. Time-out: I initiated and conducted the "Time-out" before starting the procedure, as per protocol. The patient was asked to participate by confirming the accuracy of the "Time Out" information. Verification of the correct person, site, and procedure were performed and confirmed by me, the nursing staff, and the patient. "Time-out" conducted as per Joint Commission's Universal Protocol (UP.01.01.01). Time: 1041  Description of Procedure #1 Process:   Target Area: The  interlaminar space, initially targeting the lower border of the superior vertebral body lamina. Approach: Posterior paramedial approach. Area Prepped: Entire Posterior Lumbosacral Region Prepping solution: ChloraPrep (2% chlorhexidine gluconate and 70% isopropyl alcohol) Safety Precautions: Aspiration looking for blood return was conducted prior to all injections. At no point did we inject any substances, as a needle was being advanced. No attempts were made at seeking any paresthesias. Safe injection practices and needle disposal techniques used. Medications properly checked for expiration dates. SDV (single dose vial) medications used. Description of the Procedure: Protocol guidelines were followed. The patient was placed in position over the fluoroscopy table. The target area was identified and the area prepped in the usual manner. Skin desensitized using vapocoolant spray. Skin & deeper  tissues infiltrated with local anesthetic. Appropriate amount of time allowed to pass for local anesthetics to take effect. The procedure needle was introduced through the skin, ipsilateral to the reported pain, and advanced to the target area. Bone was contacted and the needle walked caudad, until the lamina was cleared. The ligamentum flavum was engaged and loss-of-resistance technique used as the epidural needle was advanced. The epidural space was identified using "loss-of-resistance technique" with 2-3 ml of PF-NaCl (0.9% NSS), in a 5cc LOR glass syringe. Proper needle  placement secured. Negative aspiration confirmed. Solution injected in intermittent fashion, asking for systemic symptoms every 0.5cc of injectate. The needles were then removed and the area cleansed, making sure to leave some of the prepping solution back to take advantage of its long term bactericidal properties. Start Time: 1041 hrs. Materials:  Needle(s) Type: Epidural needle Gauge: 17G Length: 3.5-in Medication(s): Please see orders for medications and dosing details.  Description of Procedure #2 Process:   Target Area: The inferior and lateral portion of the pedicle, just lateral to a line created by the 6:00 position of the pedicle and the superior articular process of the vertebral body below. On the lateral view, this target lies just posterior to the anterior aspect of the lamina and posterior to the midpoint created between the anterior and the posterior aspect of the neural foramina. Approach: Posterior paravertebral approach. Area Prepped: Same as above Prepping solution: Same as above Safety Precautions: Same as above Description of the Procedure: Protocol guidelines were followed. The patient was placed in position over the fluoroscopy table. The target area was identified and the area prepped in the usual manner. Skin desensitized using vapocoolant spray. Skin & deeper tissues infiltrated with local anesthetic.  Appropriate amount of time allowed to pass for local anesthetics to take effect. The procedure needles were then advanced to the target area. Proper needle placement secured. Negative aspiration confirmed. Solution injected in intermittent fashion, asking for systemic symptoms every 0.2cc of injectate. The needles were then removed and the area cleansed, making sure to leave some of the prepping solution back to take advantage of its long term bactericidal properties. Vitals:   01/25/18 1055 01/25/18 1100 01/25/18 1110 01/25/18 1121  BP: 101/72 (!) 104/49 102/65 (!) 95/51  Pulse:      Resp: _0 (!) 23  Temp:  97.7 F (36.5 C)    TempSrc:  Temporal    SpO2: 98% 94% 96% 95%  Weight:      Height:        End Time: 1051 hrs. Materials:  Needle(s) Type: Regular needle Gauge: 22G Length: 3.5-in Medication(s): Please see orders for medications and dosing details.  Imaging Guidance (Spinal):  Type of Imaging Technique: Fluoroscopy Guidance (Spinal) Indication(s): Assistance in needle guidance and placement for procedures requiring needle placement in or near specific anatomical locations not easily accessible without such assistance. Exposure Time: Please see nurses notes. Contrast: Before injecting any contrast, we confirmed that the patient did not have an allergy to iodine, shellfish, or radiological contrast. Once satisfactory needle placement was completed at the desired level, radiological contrast was injected. Contrast injected under live fluoroscopy. No contrast complications. See chart for type and volume of contrast used. Fluoroscopic Guidance: I was personally present during the use of fluoroscopy. "Tunnel Vision Technique" used to obtain the best possible view of the target area. Parallax error corrected before commencing the procedure. "Direction-depth-direction" technique used to introduce the needle under continuous pulsed fluoroscopy. Once target was reached, antero-posterior,  oblique, and lateral fluoroscopic projection used confirm needle placement in all planes. Images permanently stored in EMR. Interpretation: I personally interpreted the imaging intraoperatively. Adequate needle placement confirmed in multiple planes. Appropriate spread of contrast into desired area was observed. No evidence of afferent or efferent intravascular uptake. No intrathecal or subarachnoid spread observed. Permanent images saved into the patient's record.  Antibiotic Prophylaxis:   Anti-infectives (From admission, onward)   None     Indication(s): None identified  Post-operative Assessment:  Post-procedure Vital Signs:  Pulse Rate: 72 Temp: 97.7 F (36.5 C) Resp: (!) 23 BP: (!) 95/51 SpO2: 95 %  EBL: None  Complications: No immediate post-treatment complications observed by team, or reported by patient.  Note: The patient tolerated the entire procedure well. A repeat set of vitals were taken after the procedure and the patient was kept under observation following institutional policy, for this type of procedure. Post-procedural neurological assessment was performed, showing return to baseline, prior to discharge. The patient was provided with post-procedure discharge instructions, including a section on how to identify potential problems. Should any problems arise concerning this procedure, the patient was given instructions to immediately contact us, at any time, without hesitation. In any case, we plan to contact the patient by telephone for a follow-up status report regarding this interventional procedure.  Comments:  No additional relevant information.  Plan of Care    Imaging Orders     DG C-Arm 1-60 Min-No Report  Procedure Orders     Lumbar Epidural Injection     Lumbar Transforaminal Epidural  Medications ordered for procedure: Meds ordered this encounter  Medications  . iopamidol (ISOVUE-M) 41 % intrathecal injection 10 mL    Must be Myelogram-compatible.  If not available, you may substitute with a water-soluble, non-ionic, hypoallergenic, myelogram-compatible radiological contrast medium.  Marland Kitchen lidocaine (XYLOCAINE) 2 % (with pres) injection 400 mg  . midazolam (VERSED) 5 MG/5ML injection 1-2 mg    Make sure Flumazenil is available in the pyxis when using this medication. If oversedation occurs, administer 0.2 mg IV over 15 sec. If after 45 sec no response, administer 0.2 mg again over 1 min; may repeat at 1 min intervals; not to exceed 4 doses (1 mg)  . fentaNYL (SUBLIMAZE) injection 25-50 mcg    Make sure Narcan is available in the pyxis when using this medication. In the event of respiratory depression (RR< 8/min): Titrate NARCAN (naloxone) in increments of 0.1 to 0.2 mg IV at 2-3 minute intervals, until desired degree of reversal.  . lactated ringers infusion 1,000 mL  . sodium chloride flush (NS) 0.9 % injection 1 mL  . ropivacaine (PF) 2 mg/mL (0.2%) (NAROPIN) injection 1 mL  . dexamethasone (DECADRON) injection 10 mg  . sodium chloride flush (NS) 0.9 % injection 2 mL  . ropivacaine (PF) 2 mg/mL (0.2%) (NAROPIN) injection 2 mL  . triamcinolone acetonide (KENALOG-40) injection 40 mg   Medications administered: We administered iopamidol, lidocaine, midazolam, fentaNYL, lactated ringers, sodium chloride flush, ropivacaine (PF) 2 mg/mL (0.2%), dexamethasone, sodium chloride flush, ropivacaine (PF) 2 mg/mL (0.2%), and triamcinolone acetonide.  See the medical record for exact dosing, route, and time of administration.  New Prescriptions   No medications on file   Disposition: Discharge home  Discharge Date & Time: 01/25/2018; 1123 hrs.   Physician-requested Follow-up: Return for post-procedure eval (2 wks), w/ [name], NP.  Future Appointments  Date Time Provider [place]  01/31/2018  2:30 PM Vevelyn Francois, NP [facility] None  02/01/2018  2:10 PM [facility]  02/01/2018  2:15 PM Verlon Au, NP [facility]  None  02/19/2018  1:45 PM Milinda Pointer, MD [facility] None  03/14/2018  1:20 PM [name], FNP [facility] None  04/10/2018  3:00 PM Harlin Heys, MD [facility] None  01/21/2019  2:20 PM [facility]SE HEALTH ADVISOR [facility] None   Primary Care Physician: Margo Common, PA Location: [facility] Note by: Gaspar Cola, MD Date: 01/25/2018; Time: 11:46 AM  Disclaimer:  Medicine is not an Chief Strategy Officer. The only guarantee in medicine is that nothing is guaranteed. It is important to note that the decision to proceed with this intervention was based on the information collected from the patient. The Data and conclusions were drawn from the patient's questionnaire, the interview, and the physical examination. Because the information was provided in large part by the patient, it cannot be guaranteed that it has not been purposely or unconsciously manipulated. Every effort has been made to obtain as much relevant data as possible for this evaluation. It is important to note that the conclusions that lead to this procedure are derived in large part from the available data. Always take into account that the treatment will also be dependent on availability of resources and existing treatment guidelines, considered by other Pain Management Practitioners as being common knowledge and practice, at the time of the intervention. For Medico-Legal purposes, it is also important to point out that variation in procedural techniques and pharmacological choices are the acceptable norm. The indications, contraindications, technique, and results of the above procedure should only be interpreted and judged by a Board-Certified Interventional Pain Specialist with extensive familiarity and expertise in the same exact procedure and technique.

## 2018-01-25 NOTE — Patient Instructions (Signed)
____________________________________________________________________________________________  Post-Procedure Discharge Instructions  Instructions:  Apply ice: Fill a plastic sandwich bag with crushed ice. Cover it with a small towel and apply to injection site. Apply for 15 minutes then remove x 15 minutes. Repeat sequence on day of procedure, until you go to bed. The purpose is to minimize swelling and discomfort after procedure.  Apply heat: Apply heat to procedure site starting the day following the procedure. The purpose is to treat any soreness and discomfort from the procedure.  Food intake: Start with clear liquids (like water) and advance to regular food, as tolerated.   Physical activities: Keep activities to a minimum for the first 8 hours after the procedure.   Driving: If you have received any sedation, you are not allowed to drive for 24 hours after your procedure.  Blood thinner: Restart your blood thinner 6 hours after your procedure. (Only for those taking blood thinners)  Insulin: As soon as you can eat, you may resume your normal dosing schedule. (Only for those taking insulin)  Infection prevention: Keep procedure site clean and dry.  Post-procedure Pain Diary: Extremely important that this be done correctly and accurately. Recorded information will be used to determine the next step in treatment.  Pain evaluated is that of treated area only. Do not include pain from an untreated area.  Complete every hour, on the hour, for the initial 8 hours. Set an alarm to help you do this part accurately.  Do not go to sleep and have it completed later. It will not be accurate.  Follow-up appointment: Keep your follow-up appointment after the procedure. Usually 2 weeks for most procedures. (6 weeks in the case of radiofrequency.) Bring you pain diary.   Expect:  From numbing medicine (AKA: Local Anesthetics): Numbness or decrease in pain.  Onset: Full effect within 15  minutes of injected.  Duration: It will depend on the type of local anesthetic used. On the average, 1 to 8 hours.   From steroids: Decrease in swelling or inflammation. Once inflammation is improved, relief of the pain will follow.  Onset of benefits: Depends on the amount of swelling present. The more swelling, the longer it will take for the benefits to be seen. In some cases, up to 10 days.  Duration: Steroids will stay in the system x 2 weeks. Duration of benefits will depend on multiple posibilities including persistent irritating factors.  From procedure: Some discomfort is to be expected once the numbing medicine wears off. This should be minimal if ice and heat are applied as instructed.  Call if:  You experience numbness and weakness that gets worse with time, as opposed to wearing off.  New onset bowel or bladder incontinence. (This applies to Spinal procedures only)  Emergency Numbers:  Durning business hours (Monday - Thursday, 8:00 AM - 4:00 PM) (Friday, 9:00 AM - 12:00 Noon): [phone]  After hours: [phone] ____________________________________________________________________________________________    

## 2018-01-26 ENCOUNTER — Telehealth: Payer: Self-pay

## 2018-01-26 NOTE — Telephone Encounter (Signed)
Post procedure phone call. Patient states she is doing good.

## 2018-01-29 DIAGNOSIS — J9610 Chronic respiratory failure, unspecified whether with hypoxia or hypercapnia: Secondary | ICD-10-CM | POA: Diagnosis not present

## 2018-01-31 ENCOUNTER — Encounter: Payer: Self-pay | Admitting: Nurse Practitioner

## 2018-01-31 ENCOUNTER — Ambulatory Visit: Payer: Medicare HMO | Attending: Nurse Practitioner | Admitting: Nurse Practitioner

## 2018-01-31 ENCOUNTER — Other Ambulatory Visit: Payer: Self-pay

## 2018-01-31 VITALS — BP 104/72 | HR 73 | Temp 98.4°F | Ht 62.0 in | Wt 236.0 lb

## 2018-01-31 DIAGNOSIS — E559 Vitamin D deficiency, unspecified: Secondary | ICD-10-CM | POA: Insufficient documentation

## 2018-01-31 DIAGNOSIS — G8929 Other chronic pain: Secondary | ICD-10-CM | POA: Diagnosis not present

## 2018-01-31 DIAGNOSIS — G4730 Sleep apnea, unspecified: Secondary | ICD-10-CM | POA: Insufficient documentation

## 2018-01-31 DIAGNOSIS — R4182 Altered mental status, unspecified: Secondary | ICD-10-CM | POA: Insufficient documentation

## 2018-01-31 DIAGNOSIS — I110 Hypertensive heart disease with heart failure: Secondary | ICD-10-CM | POA: Diagnosis not present

## 2018-01-31 DIAGNOSIS — F17210 Nicotine dependence, cigarettes, uncomplicated: Secondary | ICD-10-CM | POA: Insufficient documentation

## 2018-01-31 DIAGNOSIS — G5600 Carpal tunnel syndrome, unspecified upper limb: Secondary | ICD-10-CM | POA: Diagnosis not present

## 2018-01-31 DIAGNOSIS — Z7951 Long term (current) use of inhaled steroids: Secondary | ICD-10-CM | POA: Diagnosis not present

## 2018-01-31 DIAGNOSIS — I509 Heart failure, unspecified: Secondary | ICD-10-CM | POA: Insufficient documentation

## 2018-01-31 DIAGNOSIS — Z9981 Dependence on supplemental oxygen: Secondary | ICD-10-CM | POA: Insufficient documentation

## 2018-01-31 DIAGNOSIS — J310 Chronic rhinitis: Secondary | ICD-10-CM | POA: Diagnosis not present

## 2018-01-31 DIAGNOSIS — M79605 Pain in left leg: Secondary | ICD-10-CM

## 2018-01-31 DIAGNOSIS — M545 Low back pain: Secondary | ICD-10-CM | POA: Diagnosis present

## 2018-01-31 DIAGNOSIS — M79604 Pain in right leg: Secondary | ICD-10-CM | POA: Diagnosis not present

## 2018-01-31 DIAGNOSIS — Z7982 Long term (current) use of aspirin: Secondary | ICD-10-CM | POA: Insufficient documentation

## 2018-01-31 DIAGNOSIS — F329 Major depressive disorder, single episode, unspecified: Secondary | ICD-10-CM | POA: Diagnosis not present

## 2018-01-31 DIAGNOSIS — M25512 Pain in left shoulder: Secondary | ICD-10-CM | POA: Diagnosis not present

## 2018-01-31 DIAGNOSIS — J449 Chronic obstructive pulmonary disease, unspecified: Secondary | ICD-10-CM | POA: Diagnosis not present

## 2018-01-31 DIAGNOSIS — M48061 Spinal stenosis, lumbar region without neurogenic claudication: Secondary | ICD-10-CM | POA: Insufficient documentation

## 2018-01-31 DIAGNOSIS — M5116 Intervertebral disc disorders with radiculopathy, lumbar region: Secondary | ICD-10-CM | POA: Diagnosis not present

## 2018-01-31 DIAGNOSIS — Z79891 Long term (current) use of opiate analgesic: Secondary | ICD-10-CM | POA: Insufficient documentation

## 2018-01-31 DIAGNOSIS — N281 Cyst of kidney, acquired: Secondary | ICD-10-CM | POA: Insufficient documentation

## 2018-01-31 DIAGNOSIS — M4726 Other spondylosis with radiculopathy, lumbar region: Secondary | ICD-10-CM | POA: Diagnosis not present

## 2018-01-31 DIAGNOSIS — Z87442 Personal history of urinary calculi: Secondary | ICD-10-CM | POA: Insufficient documentation

## 2018-01-31 DIAGNOSIS — Z6841 Body Mass Index (BMI) 40.0 and over, adult: Secondary | ICD-10-CM | POA: Insufficient documentation

## 2018-01-31 DIAGNOSIS — M47816 Spondylosis without myelopathy or radiculopathy, lumbar region: Secondary | ICD-10-CM

## 2018-01-31 DIAGNOSIS — M533 Sacrococcygeal disorders, not elsewhere classified: Secondary | ICD-10-CM | POA: Insufficient documentation

## 2018-01-31 DIAGNOSIS — G894 Chronic pain syndrome: Secondary | ICD-10-CM

## 2018-01-31 DIAGNOSIS — Z79899 Other long term (current) drug therapy: Secondary | ICD-10-CM | POA: Diagnosis not present

## 2018-01-31 MED ORDER — HYDROCODONE-ACETAMINOPHEN 5-325 MG PO TABS: 1.0000 | ORAL_TABLET | Freq: Three times a day (TID) | ORAL | 0 refills | Status: DC | PRN

## 2018-01-31 MED ORDER — GABAPENTIN 100 MG PO CAPS: 100.0000 mg | ORAL_CAPSULE | Freq: Every day | ORAL | 2 refills | Status: DC

## 2018-01-31 NOTE — Progress Notes (Signed)
Nursing Pain Medication Assessment:  Safety precautions to be maintained throughout the outpatient stay will include: orient to surroundings, keep bed in low position, maintain call bell within reach at all times, provide assistance with transfer out of bed and ambulation.  Medication Inspection Compliance: Pill count conducted under aseptic conditions, in front of the patient. Neither the pills nor the bottle was removed from the patient's sight at any time. Once count was completed pills were immediately returned to the patient in their original bottle.  Medication: Hydrocodone/APAP Pill/Patch Count: 47 of 90 pills remain Pill/Patch Appearance: Markings consistent with prescribed medication Bottle Appearance: Standard pharmacy container. Clearly labeled. Filled Date: 4 / 07 / 2019 Last Medication intake:  Today

## 2018-01-31 NOTE — Patient Instructions (Addendum)
____________________________________________________________________________________________  Medication Rules  Applies to: All patients receiving prescriptions (written or electronic).  Pharmacy of record: Pharmacy where electronic prescriptions will be sent. If written prescriptions are taken to a different pharmacy, please inform the nursing staff. The pharmacy listed in the electronic medical record should be the one where you would like electronic prescriptions to be sent.  Prescription refills: Only during scheduled appointments. Applies to both, written and electronic prescriptions.  NOTE: The following applies primarily to controlled substances (Opioid* Pain Medications).   Patient's responsibilities: 1. Pain Pills: Bring all pain pills to every appointment (except for procedure appointments). 2. Pill Bottles: Bring pills in original pharmacy bottle. Always bring newest bottle. Bring bottle, even if empty. 3. Medication refills: You are responsible for knowing and keeping track of what medications you need refilled. The day before your appointment, write a list of all prescriptions that need to be refilled. Bring that list to your appointment and give it to the admitting nurse. Prescriptions will be written only during appointments. If you forget a medication, it will not be "Called in", "Faxed", or "electronically sent". You will need to get another appointment to get these prescribed. 4. Prescription Accuracy: You are responsible for carefully inspecting your prescriptions before leaving our office. Have the discharge nurse carefully go over each prescription with you, before taking them home. Make sure that your name is accurately spelled, that your address is correct. Check the name and dose of your medication to make sure it is accurate. Check the number of pills, and the written instructions to make sure they are clear and accurate. Make sure that you are given enough medication to last  until your next medication refill appointment. 5. Taking Medication: Take medication as prescribed. Never take more pills than instructed. Never take medication more frequently than prescribed. Taking less pills or less frequently is permitted and encouraged, when it comes to controlled substances (written prescriptions).  6. Inform other Doctors: Always inform, all of your healthcare providers, of all the medications you take. 7. Pain Medication from other Providers: You are not allowed to accept any additional pain medication from any other Doctor or Healthcare provider. There are two exceptions to this rule. (see below) In the event that you require additional pain medication, you are responsible for notifying us, as stated below. 8. Medication Agreement: You are responsible for carefully reading and following our Medication Agreement. This must be signed before receiving any prescriptions from our practice. Safely store a copy of your signed Agreement. Violations to the Agreement will result in no further prescriptions. (Additional copies of our Medication Agreement are available upon request.) 9. Laws, Rules, & Regulations: All patients are expected to follow all Federal and State Laws, Statutes, Rules, & Regulations. Ignorance of the Laws does not constitute a valid excuse. The use of any illegal substances is prohibited. 10. Adopted CDC guidelines & recommendations: Target dosing levels will be at or below 60 MME/day. Use of benzodiazepines** is not recommended.  Exceptions: There are only two exceptions to the rule of not receiving pain medications from other Healthcare Providers. 1. Exception #1 (Emergencies): In the event of an emergency (i.e.: accident requiring emergency care), you are allowed to receive additional pain medication. However, you are responsible for: As soon as you are able, call our office [phone], at any time of the day or night, and leave a message stating your name, the  date and nature of the emergency, and the name and dose of the medication   prescribed. In the event that your call is answered by a member of our staff, make sure to document and save the date, time, and the name of the person that took your information.  2. Exception #2 (Planned Surgery): In the event that you are scheduled by another doctor or dentist to have any type of surgery or procedure, you are allowed (for a period no longer than 30 days), to receive additional pain medication, for the acute post-op pain. However, in this case, you are responsible for picking up a copy of our "Post-op Pain Management for Surgeons" handout, and giving it to your surgeon or dentist. This document is available at our office, and does not require an appointment to obtain it. Simply go to our office during business hours (Monday-Thursday from 8:00 AM to 4:00 PM) (Friday 8:00 AM to 12:00 Noon) or if you have a scheduled appointment with [place], prior to your surgery, and ask for it by name. In addition, you will need to provide [place] with your name, name of your surgeon, type of surgery, and date of procedure or surgery.  *Opioid medications include: morphine, codeine, oxycodone, oxymorphone, hydrocodone, hydromorphone, meperidine, tramadol, tapentadol, buprenorphine, fentanyl, methadone. **Benzodiazepine medications include: diazepam (Valium), alprazolam (Xanax), clonazepam (Klonopine), lorazepam (Ativan), clorazepate (Tranxene), chlordiazepoxide (Librium), estazolam (Prosom), oxazepam (Serax), temazepam (Restoril), triazolam (Halcion) (Last updated: 12/14/2017) ____________________________________________________________________________________________    BMI Assessment: Estimated body mass index is 43.16 kg/m as calculated from the following:   Height as of this encounter: _0  (1.575 m).   Weight as of this encounter: 236 lb (107 kg).  BMI interpretation table: BMI level Category Range association with higher  incidence of chronic pain  <18 kg/m2 Underweight   18.5-24.9 kg/m2 Ideal body weight   25-29.9 kg/m2 Overweight Increased incidence by 20%  30-34.9 kg/m2 Obese (Class I) Increased incidence by 68%  35-39.9 kg/m2 Severe obesity (Class II) Increased incidence by 136%  >40 kg/m2 Extreme obesity (Class III) Increased incidence by 254%   BMI Readings from Last 4 Encounters:  01/31/18 43.16 kg/m  01/25/18 41.81 kg/m  01/18/18 41.88 kg/m  01/08/18 41.45 kg/m   Wt Readings from Last 4 Encounters:  01/31/18 236 lb (107 kg)  01/25/18 236 lb (107 kg)  01/18/18 236 lb 6.4 oz (107.2 kg)  01/08/18 234 lb (106.1 kg)

## 2018-01-31 NOTE — Progress Notes (Signed)
Patient's Name: Theresa [name]  MRN: [id number]  Referring Provider: Margo Common, [place]  DOB: 11-14-1955  PCP: Margo Common, PA  DOS: 01/31/2018  Note by: Vevelyn Francois NP  Service setting: Ambulatory outpatient  Specialty: Interventional Pain Management  Location: [facility] (AMB) Pain Management Facility    Patient type: Established    Primary Reason(s) for Visit: Encounter for prescription drug management. (Level of risk: moderate)  CC: Back Pain (lower)  HPI  Theresa [name] is a 62 y.o. year old, female patient, who comes today for a medication management evaluation. She has Neuritis or radiculitis due to rupture of lumbar intervertebral disc; Decreased motor strength; Essential (primary) hypertension; Clinical depression; DDD (degenerative disc disease), lumbar; CAFL (chronic airflow limitation) ([place]); Chronic rhinitis; Carpal tunnel syndrome; Blood clotting disorder ([place]); Anxiety state; Chronic diastolic heart failure ([place]); Chest pain; Tobacco abuse; Encounter for therapeutic drug level monitoring; Encounter for chronic pain management; Pain management; Neurogenic pain; Neuropathic pain; Musculoskeletal pain; Lumbar spondylosis (L4-5); Chronic low back pain (Primary Area of Pain) (Left); Chronic lower extremity pain (Secondary Area of Pain) (Left); Chronic lumbar radicular pain (L4 & S1 Dermatome) (Left); Chronic shoulder pain (Right side); Chronic upper extremity pain (Right-sided); Cervical radiculitis (Right side); Abnormal x-ray of lumbar spine; Chronic pain syndrome (significant psychosocial component); 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; Coagulation disorder ([place]); 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); Obesity, Class III, BMI 40-49.9 (morbid obesity) ([place]); 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[place]); Vitamin D deficiency; Long term current use of opiate analgesic; Long term prescription opiate use; Opiate use (15 MME/Day); Lumbar facet syndrome (Bilateral)  (L>R); Chronic sacroiliac joint pain (Bilateral); Sleep apnea; Supplemental oxygen dependent; Lumbar facet hypertrophy (multilevel); Lumbar spinal stenosis (L4-5); 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); 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); Lumbosacral radiculopathy at L4; Grief at loss of child; Chronic shoulder pain (Left); Elevated brain natriuretic peptide (BNP) level; Arthralgia of acromioclavicular joint (Right); Acromioclavicular joint DJD (Right); Osteoarthritis of shoulder (Right); COPD with hypoxia ([place]); Other long term (current) drug therapy; Disorder of bone, unspecified; COPD (chronic obstructive pulmonary disease) ([place]); Chronic hip pain (Tertiary Area of Pain) (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); Altered mental status, unspecified; Acute respiratory failure (HCC); and Chronic lower extremity pain (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) on their problem list. Her primarily concern today is the Back Pain (lower)  Pain Assessment: Location: Lower Back Radiating: Denies Onset: More than a month ago Duration: Chronic pain Quality: Dull, Stabbing Severity: 5 /10 (self-reported pain score)  Note: Reported level is compatible with observation.                          Effect on ADL: unable to walk very far Timing: Constant Modifying factors: medication  Theresa [name] was last scheduled for an appointment on 11/08/2017 for medication management. During today's appointment we reviewed Theresa [name]'s chronic pain status, as well as her outpatient medication regimen. She states that when she stands she has leg pain.   The patient  reports that she does not use drugs. Her body mass index is 43.16 kg/m.  Further details on both, my assessment(s), as well as the proposed treatment plan, please see below.  Controlled Substance Pharmacotherapy Assessment REMS (Risk Evaluation and Mitigation Strategy)  Analgesic:Hydrocodone/APAP 5/325 one tablet every 8 hours (15 mg/day of hydrocodone) MME/day:15  mg/day    Chauncey Fischer, RN  01/31/2018  2:55 PM  Sign at close encounter Nursing Pain Medication Assessment:  Safety precautions to be maintained throughout the  outpatient stay will include: orient to surroundings, keep bed in low position, maintain call bell within reach at all times, provide assistance with transfer out of bed and ambulation.  Medication Inspection Compliance: Pill count conducted under aseptic conditions, in front of the patient. Neither the pills nor the bottle was removed from the patient's sight at any time. Once count was completed pills were immediately returned to the patient in their original bottle.  Medication: Hydrocodone/APAP Pill/Patch Count: 47 of 90 pills remain Pill/Patch Appearance: Markings consistent with prescribed medication Bottle Appearance: Standard pharmacy container. Clearly labeled. Filled Date: 4 / 07 / 2019 Last Medication intake:  Today   Pharmacokinetics: Liberation and absorption (onset of action): WNL Distribution (time to peak effect): WNL Metabolism and excretion (duration of action): WNL         Pharmacodynamics: Desired effects: Analgesia: Theresa [name] reports >50% benefit. Functional ability: Patient reports that medication allows her to accomplish basic ADLs Clinically meaningful improvement in function (CMIF): Sustained CMIF goals met Perceived effectiveness: Described as relatively effective, allowing for increase in activities of daily living (ADL) Undesirable effects: Side-effects or Adverse reactions: None reported Monitoring: [place] PMP: Online review of the past 35-monthperiod conducted. Compliant with practice rules and regulations Last UDS on record: Summary  Date Value Ref Range Status  11/08/2017 FINAL  Final    Comment:    ==================================================================== TOXASSURE SELECT 13 (MW) ==================================================================== Test                              Result       Flag       Units Drug Present and Declared for Prescription Verification   Hydrocodone                    679          EXPECTED   ng/mg creat   Norhydrocodone                 831          EXPECTED   ng/mg creat    Sources of hydrocodone include scheduled prescription    medications. Norhydrocodone is an expected metabolite of    hydrocodone. Drug Absent but Declared for Prescription Verification   Alprazolam                     Not Detected UNEXPECTED ng/mg creat ==================================================================== Test                      Result    Flag   Units      Ref Range   Creatinine              29               mg/dL      >=20 ==================================================================== Declared Medications:  The flagging and interpretation on this report are based on the  following declared medications.  Unexpected results may arise from  inaccuracies in the declared medications.  **Note: The testing scope of this panel includes these medications:  Alprazolam  Hydrocodone (Norco)  **Note: The testing scope of this panel does not include following  reported medications:  Acetaminophen (Norco)  Albuterol (Duoneb)  Albuterol (Proventil)  Bisoprolol (Ziac)  Cholecalciferol  Fluticasone (Advair)  Furosemide (Lasix)  Gabapentin (Neurontin)  Hydrochlorothiazide (Ziac)  Ipratropium (Duoneb)  Ketoconazole (Nizoral)  Potassium (Klor-Con)  Ranitidine (Zantac)  Salicylate (  Anacin)  Salmeterol (Advair)  Sertraline (Zoloft) ==================================================================== For clinical consultation, please call [phone]. ====================================================================    UDS interpretation: Compliant          Medication Assessment Form: Reviewed. Patient indicates being compliant with therapy Treatment compliance: Compliant Risk Assessment Profile: Aberrant behavior: See prior  evaluations. None observed or detected today Comorbid factors increasing risk of overdose: See prior notes. No additional risks detected today Risk of substance use disorder (SUD): Low Opioid Risk Tool - 01/08/18 1424      Family History of Substance Abuse   Alcohol  Positive Female    Illegal Drugs  Positive Female    Rx Drugs  Positive Female or Female      Personal History of Substance Abuse   Alcohol  Negative    Illegal Drugs  Negative    Rx Drugs  Negative      Psychological Disease   Psychological Disease  Positive    ADD  Negative    OCD  Negative    Bipolar  Negative    Schizophrenia  Negative    Depression  Positive zoloft for depression and working.  takes xanax as needed   zoloft for depression and working.  takes xanax as needed     Total Score   Opioid Risk Tool Scoring  10    Opioid Risk Interpretation  High Risk      ORT Scoring interpretation table:  Score <3 = Low Risk for SUD  Score between 4-7 = Moderate Risk for SUD  Score >8 = High Risk for Opioid Abuse   Risk Mitigation Strategies:  Patient Counseling: Covered Patient-Prescriber Agreement (PPA): Present and active  Notification to other healthcare providers: Done  Pharmacologic Plan: No change in therapy, at this time.             Laboratory Chemistry  Inflammation Markers (CRP: Acute Phase) (ESR: Chronic Phase) Lab Results  Component Value Date   CRP 9.1 (H) 08/08/2017   ESRSEDRATE 16 08/08/2017   LATICACIDVEN 0.9 12/10/2017                         Rheumatology Markers No results found for: RF, ANA, LABURIC, URICUR, LYMEIGGIGMAB, [facility]                      Renal Function Markers Lab Results  Component Value Date   BUN 25 (H) 12/11/2017   CREATININE 0.90 12/11/2017   GFRAA >60 12/11/2017   GFRNONAA >60 12/11/2017                              Hepatic Function Markers Lab Results  Component Value Date   AST 24 12/09/2017   ALT 21 12/09/2017   ALBUMIN 3.8 12/09/2017    ALKPHOS 87 12/09/2017   LIPASE 31 10/17/2017   AMMONIA 31 12/09/2017                        Electrolytes Lab Results  Component Value Date   NA 143 12/11/2017   K 3.6 12/11/2017   CL 104 12/11/2017   CALCIUM 9.3 12/11/2017   MG 2.5 (H) 12/10/2017   PHOS 5.4 (H) 12/10/2017                        Neuropathy Markers Lab Results  Component Value Date   VITAMINB12  501 08/08/2017   HIV Non Reactive 12/10/2017                        Bone Pathology Markers Lab Results  Component Value Date   25OHVITD1 13 (L) 09/17/2015   25OHVITD2 <1.0 09/17/2015   25OHVITD3 13 09/17/2015                         Coagulation Parameters Lab Results  Component Value Date   INR 1.04 12/09/2017   LABPROT 13.6 12/09/2017   APTT 36 12/09/2017   PLT 173 12/22/2017                        Cardiovascular Markers Lab Results  Component Value Date   BNP 333.4 (H) 12/10/2017   CKTOTAL 72 12/27/2012   CKMB 1.0 12/27/2012   TROPONINI <0.03 10/17/2017   HGB 13.8 12/22/2017   HCT 43.2 12/22/2017                         CA Markers No results found for: CEA, CA125, LABCA2                      Note: Lab results reviewed.  Recent Diagnostic Imaging Results  DG C-Arm 1-60 Min-No Report Fluoroscopy was utilized by the requesting physician.  No radiographic  interpretation.   Complexity Note: Imaging results reviewed. Results shared with Theresa [name], using Layman's terms.                         Meds   Current Outpatient Medications:  .  ADVAIR DISKUS 250-50 MCG/DOSE AEPB, Inhale 1 puff into the lungs 2 (two) times daily., Disp: , Rfl: 3 .  albuterol (PROAIR HFA) 108 (90 Base) MCG/ACT inhaler, INHALE 2 PUFFS BY MOUTH EVERY 6 HOURS AS NEEDED FOR WHEEZING, Disp: , Rfl:  .  ALPRAZolam (XANAX) 0.5 MG tablet, Take 1 tablet (0.5 mg total) by mouth 2 (two) times daily as needed for anxiety., Disp: 60 tablet, Rfl: 0 .  Aspirin-Caffeine (BC FAST PAIN RELIEF ARTHRITIS) 1000-65 MG PACK, Take 1 packet by  mouth every 6 (six) hours as needed (Pain)., Disp: , Rfl:  .  bisoprolol-hydrochlorothiazide (ZIAC) 10-6.25 MG tablet, Take 1 tablet by mouth daily., Disp: , Rfl:  .  Cholecalciferol ([employer] D3) 2000 units CAPS, Take 1 capsule by mouth daily., Disp: , Rfl:  .  DALIRESP 500 MCG TABS tablet, Take 500 mcg by mouth daily., Disp: , Rfl:  .  Fluticasone-Salmeterol (ADVAIR DISKUS) 250-50 MCG/DOSE AEPB, INHALE 1 PUFF INTO LUNGS TWICE A DAY, Disp: , Rfl:  .  furosemide (LASIX) 40 MG tablet, Take 40 mg by mouth 2 (two) times daily., Disp: , Rfl:  .  ipratropium-albuterol (DUONEB) 0.5-2.5 (3) MG/3ML SOLN, Inhale 3 mLs into the lungs every 6 (six) hours as needed for shortness of breath., Disp: , Rfl: 3 .  OXYGEN, Inhale 2.5 L into the lungs continuous. Reported on 11/03/2015, Disp: , Rfl:  .  potassium chloride SA (K-DUR,KLOR-CON) 20 MEQ tablet, Take 20 mEq by mouth 2 (two) times daily., Disp: , Rfl:  .  ranitidine (ZANTAC) 150 MG tablet, Take 150 mg by mouth 2 (two) times daily., Disp: , Rfl:  .  sertraline (ZOLOFT) 100 MG tablet, Take 2 tablets (200 mg total) by mouth every morning., Disp: 60 tablet, Rfl:  3 .  VENTOLIN HFA 108 (90 Base) MCG/ACT inhaler, Inhale 2 puffs into the lungs every 6 (six) hours as needed for shortness of breath., Disp: , Rfl: 5  ROS  Constitutional: Denies any fever or chills Gastrointestinal: No reported hemesis, hematochezia, vomiting, or acute GI distress Musculoskeletal: Denies any acute onset joint swelling, redness, loss of ROM, or weakness Neurological: No reported episodes of acute onset apraxia, aphasia, dysarthria, agnosia, amnesia, paralysis, loss of coordination, or loss of consciousness  Allergies  Theresa [name] is allergic to codeine and meloxicam.  PFSH  Drug: Theresa [name]  reports that she does not use drugs. Alcohol:  reports that she does not drink alcohol. Tobacco:  reports that she has been smoking cigarettes.  She has a 40.00 pack-year smoking history. She has  never used smokeless tobacco. Medical:  has a past medical history of Acute postoperative pain (10/03/2017), Anxiety, Carpal tunnel syndrome, CHF (congestive heart failure) ([place]), CHF (congestive heart failure) ([place]), Chronic generalized abdominal pain, Chronic rhinitis, COPD (chronic obstructive pulmonary disease) ([place]), DDD (degenerative disc disease), cervical, Depression, Edema, Flu, Gastritis, Hematuria, Hypertension, Kidney stones, Low back pain, Lumbar radiculitis, Malodorous urine, Muscle weakness, Obesity, Renal cyst, Sensory urge incontinence, and Tobacco abuse. Surgical: Theresa [name]  has a past surgical history that includes Abdominal hysterectomy; Tonsillectomy; Carpal tunnel release (Left); and Tubal ligation. Family: family history includes Alcohol abuse in her father; Coronary artery disease in her mother; Heart disease in her father and mother; Lung cancer in her sister; Stroke in her mother.  Constitutional Exam  General appearance: Well nourished, well developed, and well hydrated. In no apparent acute distress Vitals:   01/31/18 1442  BP: 104/72  Pulse: 73  Temp: 98.4 F (36.9 C)  SpO2: 94%  Weight: 236 lb (107 kg)  Height: _0  (1.575 m)  Psych/Mental status: Alert, oriented x 3 (person, place, & time)       Eyes: PERLA Respiratory: Oxygen-dependent COPD  Cervical Spine Area Exam  Skin & Axial Inspection: No masses, redness, edema, swelling, or associated skin lesions Alignment: Symmetrical Functional ROM: Unrestricted ROM      Stability: No instability detected Muscle Tone/Strength: Functionally intact. No obvious neuro-muscular anomalies detected. Sensory (Neurological): Unimpaired Palpation: No palpable anomalies              Upper Extremity (UE) Exam    Side: Right upper extremity  Side: Left upper extremity  Skin & Extremity Inspection: Skin color, temperature, and hair growth are WNL. No peripheral edema or cyanosis. No masses, redness, swelling, asymmetry,  or associated skin lesions. No contractures.  Skin & Extremity Inspection: Skin color, temperature, and hair growth are WNL. No peripheral edema or cyanosis. No masses, redness, swelling, asymmetry, or associated skin lesions. No contractures.  Functional ROM: Unrestricted ROM          Functional ROM: Unrestricted ROM          Muscle Tone/Strength: Functionally intact. No obvious neuro-muscular anomalies detected.  Muscle Tone/Strength: Functionally intact. No obvious neuro-muscular anomalies detected.  Sensory (Neurological): Unimpaired          Sensory (Neurological): Unimpaired          Palpation: No palpable anomalies              Palpation: No palpable anomalies              Specialized Test(s): Deferred         Specialized Test(s): Deferred          Thoracic  Spine Area Exam  Skin & Axial Inspection: No masses, redness, or swelling Alignment: Symmetrical Functional ROM: Unrestricted ROM Stability: No instability detected Muscle Tone/Strength: Functionally intact. No obvious neuro-muscular anomalies detected. Sensory (Neurological): Unimpaired Muscle strength & Tone: No palpable anomalies  Lumbar Spine Area Exam  Skin & Axial Inspection: No masses, redness, or swelling Alignment: Symmetrical Functional ROM: Unrestricted ROM       Stability: No instability detected Muscle Tone/Strength: Functionally intact. No obvious neuro-muscular anomalies detected. Sensory (Neurological): Unimpaired Palpation: Complains of area being tender to palpation       Provocative Tests: Lumbar Hyperextension and rotation test: Positive bilaterally for facet joint pain. Lumbar Lateral bending test: evaluation deferred today       Patrick's Maneuver: evaluation deferred today                    Gait & Posture Assessment  Ambulation: Unassisted Gait: Relatively normal for age and body habitus Posture: WNL   Lower Extremity Exam    Side: Right lower extremity  Side: Left lower extremity  Skin &  Extremity Inspection: Skin color, temperature, and hair growth are WNL. No peripheral edema or cyanosis. No masses, redness, swelling, asymmetry, or associated skin lesions. No contractures.  Skin & Extremity Inspection: Skin color, temperature, and hair growth are WNL. No peripheral edema or cyanosis. No masses, redness, swelling, asymmetry, or associated skin lesions. No contractures.  Functional ROM: Unrestricted ROM          Functional ROM: Unrestricted ROM          Muscle Tone/Strength: Functionally intact. No obvious neuro-muscular anomalies detected.  Muscle Tone/Strength: Functionally intact. No obvious neuro-muscular anomalies detected.  Sensory (Neurological): Unimpaired  Sensory (Neurological): Unimpaired  Palpation: No palpable anomalies  Palpation: No palpable anomalies   Assessment  Primary Diagnosis & Pertinent Problem List: The primary encounter diagnosis was Lumbar spondylosis (L4-5). Diagnoses of Lumbar facet hypertrophy (multilevel), Chronic lower extremity pain (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), and Chronic pain syndrome were also pertinent to this visit.  Status Diagnosis  Controlled Controlled Controlled 1. Lumbar spondylosis (L4-5)   2. Lumbar facet hypertrophy (multilevel)   3. Chronic lower extremity pain (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)   4. Chronic pain syndrome     Problems updated and reviewed during this visit: No problems updated. Plan of Care  Pharmacotherapy (Medications Ordered): No orders of the defined types were placed in this encounter.  New Prescriptions   No medications on file   Medications administered today: Theresa [name] had no medications administered during this visit. Lab-work, procedure(s), and/or referral(s): No orders of the defined types were placed in this encounter.  Imaging and/or referral(s): None  Interventional therapies: Planned, scheduled, and/or pending: Not at this time   Cfor a onsidering:  Diagnostic bilateral lumbar facet block Possible  bilateral lumbar facet RFA   Palliative PRN treatment(s):  Palliative left sided lumbar facetblock       Provider-requested follow-up: Return in about 3 months (around 05/02/2018) for MedMgmt with Me [name]), in addition, Appointment As Scheduled.  Future Appointments  Date Time Provider [place]  02/01/2018  2:10 PM [facility]  02/01/2018  2:15 PM Verlon Au, NP [facility] None  02/19/2018  1:45 PM Milinda Pointer, MD [facility] None  03/14/2018  1:20 PM [name], FNP [facility] None  04/10/2018  3:00 PM Harlin Heys, MD [facility] None  01/21/2019  2:20 PM [facility]-NURSE HEALTH ADVISOR [facility] None   Primary Care Physician: Margo Common, PA Location: [facility]  Outpatient Pain Management Facility Note by: Vevelyn Francois NP Date: 01/31/2018; Time: 3:13 PM  Pain Score Disclaimer: We use the NRS-11 scale. This is a self-reported, subjective measurement of pain severity with only modest accuracy. It is used primarily to identify changes within a particular patient. It must be understood that outpatient pain scales are significantly less accurate that those used for research, where they can be applied under ideal controlled circumstances with minimal exposure to variables. In reality, the score is likely to be a combination of pain intensity and pain affect, where pain affect describes the degree of emotional arousal or changes in action readiness caused by the sensory experience of pain. Factors such as social and work situation, setting, emotional state, anxiety levels, expectation, and prior pain experience may influence pain perception and show large inter-individual differences that may also be affected by time variables.  Patient instructions provided during this appointment: Patient Instructions   ____________________________________________________________________________________________  Medication Rules  Applies to: All patients  receiving prescriptions (written or electronic).  Pharmacy of record: Pharmacy where electronic prescriptions will be sent. If written prescriptions are taken to a different pharmacy, please inform the nursing staff. The pharmacy listed in the electronic medical record should be the one where you would like electronic prescriptions to be sent.  Prescription refills: Only during scheduled appointments. Applies to both, written and electronic prescriptions.  NOTE: The following applies primarily to controlled substances (Opioid* Pain Medications).   Patient's responsibilities: 1. Pain Pills: Bring all pain pills to every appointment (except for procedure appointments). 2. Pill Bottles: Bring pills in original pharmacy bottle. Always bring newest bottle. Bring bottle, even if empty. 3. Medication refills: You are responsible for knowing and keeping track of what medications you need refilled. The day before your appointment, write a list of all prescriptions that need to be refilled. Bring that list to your appointment and give it to the admitting nurse. Prescriptions will be written only during appointments. If you forget a medication, it will not be "Called in", "Faxed", or "electronically sent". You will need to get another appointment to get these prescribed. 4. Prescription Accuracy: You are responsible for carefully inspecting your prescriptions before leaving our office. Have the discharge nurse carefully go over each prescription with you, before taking them home. Make sure that your name is accurately spelled, that your address is correct. Check the name and dose of your medication to make sure it is accurate. Check the number of pills, and the written instructions to make sure they are clear and accurate. Make sure that you are given enough medication to last until your next medication refill appointment. 5. Taking Medication: Take medication as prescribed. Never take more pills than instructed.  Never take medication more frequently than prescribed. Taking less pills or less frequently is permitted and encouraged, when it comes to controlled substances (written prescriptions).  6. Inform other Doctors: Always inform, all of your healthcare providers, of all the medications you take. 7. Pain Medication from other Providers: You are not allowed to accept any additional pain medication from any other Doctor or Healthcare provider. There are two exceptions to this rule. (see below) In the event that you require additional pain medication, you are responsible for notifying us, as stated below. 8. Medication Agreement: You are responsible for carefully reading and following our Medication Agreement. This must be signed before receiving any prescriptions from our practice. Safely store a copy of your signed Agreement. Violations to the Agreement will result in  no further prescriptions. (Additional copies of our Medication Agreement are available upon request.) 9. Laws, Rules, & Regulations: All patients are expected to follow all Federal and [employer], [employer], Rules, [employer] [facility]. Ignorance of the Laws does not constitute a valid excuse. The use of any illegal substances is prohibited. 10. Adopted CDC guidelines & recommendations: Target dosing levels will be at or below 60 MME/day. Use of benzodiazepines** is not recommended.  Exceptions: There are only two exceptions to the rule of not receiving pain medications from other Healthcare Providers. 1. Exception #1 (Emergencies): In the event of an emergency (i.e.: accident requiring emergency care), you are allowed to receive additional pain medication. However, you are responsible for: As soon as you are able, call our office [phone], at any time of the day or night, and leave a message stating your name, the date and nature of the emergency, and the name and dose of the medication prescribed. In the event that your call is answered by a member  of our staff, make sure to document and save the date, time, and the name of the person that took your information.  2. Exception #2 (Planned Surgery): In the event that you are scheduled by another doctor or dentist to have any type of surgery or procedure, you are allowed (for a period no longer than 30 days), to receive additional pain medication, for the acute post-op pain. However, in this case, you are responsible for picking up a copy of our "Post-op Pain Management for Surgeons" handout, and giving it to your surgeon or dentist. This document is available at our office, and does not require an appointment to obtain it. Simply go to our office during business hours (Monday-Thursday from 8:00 AM to 4:00 PM) (Friday 8:00 AM to 12:00 Noon) or if you have a scheduled appointment with [place], prior to your surgery, and ask for it by name. In addition, you will need to provide [place] with your name, name of your surgeon, type of surgery, and date of procedure or surgery.  *Opioid medications include: morphine, codeine, oxycodone, oxymorphone, hydrocodone, hydromorphone, meperidine, tramadol, tapentadol, buprenorphine, fentanyl, methadone. **Benzodiazepine medications include: diazepam (Valium), alprazolam (Xanax), clonazepam (Klonopine), lorazepam (Ativan), clorazepate (Tranxene), chlordiazepoxide (Librium), estazolam (Prosom), oxazepam (Serax), temazepam (Restoril), triazolam (Halcion) (Last updated: 12/14/2017) ____________________________________________________________________________________________    BMI Assessment: Estimated body mass index is 43.16 kg/m as calculated from the following:   Height as of this encounter: 5' 2" (1.575 m).   Weight as of this encounter: 236 lb (107 kg).  BMI interpretation table: BMI level Category Range association with higher incidence of chronic pain  <18 kg/m2 Underweight   18.5-24.9 kg/m2 Ideal body weight   25-29.9 kg/m2 Overweight Increased incidence by 20%   30-34.9 kg/m2 Obese (Class I) Increased incidence by 68%  35-39.9 kg/m2 Severe obesity (Class II) Increased incidence by 136%  >40 kg/m2 Extreme obesity (Class III) Increased incidence by 254%   BMI Readings from Last 4 Encounters:  01/31/18 43.16 kg/m  01/25/18 41.81 kg/m  01/18/18 41.88 kg/m  01/08/18 41.45 kg/m   Wt Readings from Last 4 Encounters:  01/31/18 236 lb (107 kg)  01/25/18 236 lb (107 kg)  01/18/18 236 lb 6.4 oz (107.2 kg)  01/08/18 234 lb (106.1 kg)

## 2018-02-01 ENCOUNTER — Encounter: Payer: Self-pay | Admitting: Nurse Practitioner

## 2018-02-01 ENCOUNTER — Inpatient Hospital Stay: Payer: Medicare HMO | Attending: Nurse Practitioner | Admitting: Nurse Practitioner

## 2018-02-01 ENCOUNTER — Ambulatory Visit
Admission: RE | Admit: 2018-02-01 | Discharge: 2018-02-01 | Disposition: A | Discharge status: Home / Self Care | Payer: Medicare HMO | Source: Ambulatory Visit | Attending: Nurse Practitioner | Admitting: Nurse Practitioner

## 2018-02-01 DIAGNOSIS — F17210 Nicotine dependence, cigarettes, uncomplicated: Secondary | ICD-10-CM | POA: Diagnosis not present

## 2018-02-01 DIAGNOSIS — Z87891 Personal history of nicotine dependence: Secondary | ICD-10-CM

## 2018-02-01 DIAGNOSIS — I2510 Atherosclerotic heart disease of native coronary artery without angina pectoris: Secondary | ICD-10-CM | POA: Insufficient documentation

## 2018-02-01 DIAGNOSIS — I288 Other diseases of pulmonary vessels: Secondary | ICD-10-CM | POA: Insufficient documentation

## 2018-02-01 DIAGNOSIS — I700 Atherosclerosis of aorta: Secondary | ICD-10-CM | POA: Insufficient documentation

## 2018-02-01 DIAGNOSIS — J439 Emphysema, unspecified: Secondary | ICD-10-CM | POA: Diagnosis not present

## 2018-02-01 DIAGNOSIS — J9610 Chronic respiratory failure, unspecified whether with hypoxia or hypercapnia: Secondary | ICD-10-CM | POA: Diagnosis not present

## 2018-02-01 DIAGNOSIS — Z122 Encounter for screening for malignant neoplasm of respiratory organs: Secondary | ICD-10-CM | POA: Diagnosis not present

## 2018-02-01 NOTE — Progress Notes (Signed)
In accordance with CMS guidelines, patient has met eligibility criteria including age, absence of signs or symptoms of lung cancer.  Social History   Tobacco Use  . Smoking status: Current Every Day Smoker    Packs/day: 1.00    Years: 44.00    Pack years: 44.00    Types: Cigarettes  . Smokeless tobacco: Never Used  Substance Use Topics  . Alcohol use: No    Frequency: Never  . Drug use: No      A shared decision-making session was conducted prior to the performance of CT scan. This includes one or more decision aids, includes benefits and harms of screening, follow-up diagnostic testing, over-diagnosis, false positive rate, and total radiation exposure.   Counseling on the importance of adherence to annual lung cancer LDCT screening, impact of co-morbidities, and ability or willingness to undergo diagnosis and treatment is imperative for compliance of the program.   Counseling on the importance of continued smoking cessation for former smokers; the importance of smoking cessation for current smokers, and information about tobacco cessation interventions have been given to patient including [place] and [facility] quit [place] programs.   Written order for lung cancer screening with LDCT has been given to the patient and any and all questions have been answered to the best of my abilities.    Yearly follow up will be coordinated by Burgess Estelle, Thoracic Navigator.  Beckey Rutter, DNP, AGNP-C [place] at [facility] [phone] (work cell) [phone] (office) 02/01/18 2:04 PM

## 2018-02-05 ENCOUNTER — Encounter: Payer: Self-pay | Admitting: *Deleted

## 2018-02-07 ENCOUNTER — Inpatient Hospital Stay
Admission: EM | Admit: 2018-02-07 | Discharge: 2018-02-10 | DRG: 190 | Disposition: A | Discharge status: Home / Self Care | Payer: Medicare HMO | Attending: Internal Medicine | Admitting: Internal Medicine

## 2018-02-07 ENCOUNTER — Other Ambulatory Visit: Payer: Self-pay

## 2018-02-07 ENCOUNTER — Encounter: Payer: Self-pay | Admitting: Emergency Medicine

## 2018-02-07 ENCOUNTER — Emergency Department: Payer: Medicare HMO

## 2018-02-07 DIAGNOSIS — J441 Chronic obstructive pulmonary disease with (acute) exacerbation: Principal | ICD-10-CM | POA: Diagnosis present

## 2018-02-07 DIAGNOSIS — Z8249 Family history of ischemic heart disease and other diseases of the circulatory system: Secondary | ICD-10-CM | POA: Diagnosis not present

## 2018-02-07 DIAGNOSIS — G894 Chronic pain syndrome: Secondary | ICD-10-CM | POA: Diagnosis present

## 2018-02-07 DIAGNOSIS — Z716 Tobacco abuse counseling: Secondary | ICD-10-CM

## 2018-02-07 DIAGNOSIS — F419 Anxiety disorder, unspecified: Secondary | ICD-10-CM | POA: Diagnosis not present

## 2018-02-07 DIAGNOSIS — J9621 Acute and chronic respiratory failure with hypoxia: Secondary | ICD-10-CM | POA: Diagnosis present

## 2018-02-07 DIAGNOSIS — I509 Heart failure, unspecified: Secondary | ICD-10-CM | POA: Diagnosis not present

## 2018-02-07 DIAGNOSIS — E876 Hypokalemia: Secondary | ICD-10-CM | POA: Diagnosis present

## 2018-02-07 DIAGNOSIS — Z6841 Body Mass Index (BMI) 40.0 and over, adult: Secondary | ICD-10-CM | POA: Diagnosis not present

## 2018-02-07 DIAGNOSIS — Z7951 Long term (current) use of inhaled steroids: Secondary | ICD-10-CM

## 2018-02-07 DIAGNOSIS — Z90710 Acquired absence of both cervix and uterus: Secondary | ICD-10-CM

## 2018-02-07 DIAGNOSIS — Z888 Allergy status to other drugs, medicaments and biological substances status: Secondary | ICD-10-CM | POA: Diagnosis not present

## 2018-02-07 DIAGNOSIS — J449 Chronic obstructive pulmonary disease, unspecified: Secondary | ICD-10-CM | POA: Diagnosis present

## 2018-02-07 DIAGNOSIS — Z885 Allergy status to narcotic agent status: Secondary | ICD-10-CM

## 2018-02-07 DIAGNOSIS — Z9981 Dependence on supplemental oxygen: Secondary | ICD-10-CM | POA: Diagnosis not present

## 2018-02-07 DIAGNOSIS — Z801 Family history of malignant neoplasm of trachea, bronchus and lung: Secondary | ICD-10-CM

## 2018-02-07 DIAGNOSIS — F17210 Nicotine dependence, cigarettes, uncomplicated: Secondary | ICD-10-CM | POA: Diagnosis present

## 2018-02-07 DIAGNOSIS — R0602 Shortness of breath: Secondary | ICD-10-CM | POA: Diagnosis not present

## 2018-02-07 DIAGNOSIS — Z79899 Other long term (current) drug therapy: Secondary | ICD-10-CM

## 2018-02-07 DIAGNOSIS — M545 Low back pain: Secondary | ICD-10-CM | POA: Diagnosis present

## 2018-02-07 DIAGNOSIS — Z87442 Personal history of urinary calculi: Secondary | ICD-10-CM | POA: Diagnosis not present

## 2018-02-07 DIAGNOSIS — I5032 Chronic diastolic (congestive) heart failure: Secondary | ICD-10-CM | POA: Diagnosis not present

## 2018-02-07 DIAGNOSIS — I110 Hypertensive heart disease with heart failure: Secondary | ICD-10-CM | POA: Diagnosis not present

## 2018-02-07 DIAGNOSIS — J310 Chronic rhinitis: Secondary | ICD-10-CM | POA: Diagnosis not present

## 2018-02-07 LAB — CBC
HCT: 43.6 % (ref 35.0–47.0)
Hemoglobin: 14.7 g/dL (ref 12.0–16.0)
MCH: 33 pg (ref 26.0–34.0)
MCHC: 33.8 g/dL (ref 32.0–36.0)
MCV: 97.6 fL (ref 80.0–100.0)
Platelets: 135 10*3/uL — ABNORMAL LOW (ref 150–440)
RBC: 4.47 MIL/uL (ref 3.80–5.20)
RDW: 15.1 % — ABNORMAL HIGH (ref 11.5–14.5)
WBC: 10.5 10*3/uL (ref 3.6–11.0)

## 2018-02-07 LAB — BASIC METABOLIC PANEL
Anion gap: 9 (ref 5–15)
BUN: 14 mg/dL (ref 6–20)
CO2: 27 mmol/L (ref 22–32)
Calcium: 9.1 mg/dL (ref 8.9–10.3)
Chloride: 102 mmol/L (ref 101–111)
Creatinine, Ser: 0.62 mg/dL (ref 0.44–1.00)
GFR calc Af Amer: >60 mL/min (ref >60)
GFR calc non Af Amer: >60 mL/min (ref >60)
Glucose, Bld: 108 mg/dL — ABNORMAL HIGH (ref 65–99)
Potassium: 3.4 mmol/L — ABNORMAL LOW (ref 3.5–5.1)
Sodium: 138 mmol/L (ref 135–145)

## 2018-02-07 LAB — TROPONIN I: Troponin I: <0.03 ng/mL (ref <0.03)

## 2018-02-07 MED ORDER — ALBUTEROL SULFATE (2.5 MG/3ML) 0.083% IN NEBU
INHALATION_SOLUTION | RESPIRATORY_TRACT | Status: AC
  Administered 2018-02-07: 5 mg via RESPIRATORY_TRACT
  Filled 2018-02-07: qty 3

## 2018-02-07 MED ORDER — VITAMIN D 1000 UNITS PO TABS
2000.0000 [IU] | ORAL_TABLET | Freq: Every day | ORAL | Status: DC
  Administered 2018-02-08 – 2018-02-10 (×3): 2000 [IU] via ORAL
  Filled 2018-02-07 (×2): qty 2

## 2018-02-07 MED ORDER — ACETAMINOPHEN 650 MG RE SUPP: 650.0000 mg | Freq: Four times a day (QID) | RECTAL | Status: DC | PRN

## 2018-02-07 MED ORDER — METHYLPREDNISOLONE SODIUM SUCC 125 MG IJ SOLR
60.0000 mg | Freq: Four times a day (QID) | INTRAMUSCULAR | Status: DC
  Administered 2018-02-08 – 2018-02-09 (×5): 60 mg via INTRAVENOUS
  Filled 2018-02-07: qty 2
  Filled 2018-02-07: qty 0.96
  Filled 2018-02-07 (×3): qty 2
  Filled 2018-02-07: qty 0.96
  Filled 2018-02-07: qty 2

## 2018-02-07 MED ORDER — SODIUM CHLORIDE 0.9% FLUSH: 3.0000 mL | INTRAVENOUS | Status: DC | PRN

## 2018-02-07 MED ORDER — SODIUM CHLORIDE 0.9 % IV SOLN
500.0000 mg | Freq: Once | INTRAVENOUS | Status: AC
  Administered 2018-02-07: 500 mg via INTRAVENOUS
  Filled 2018-02-07: qty 500

## 2018-02-07 MED ORDER — HYDROCODONE-ACETAMINOPHEN 5-325 MG PO TABS
1.0000 | ORAL_TABLET | ORAL | Status: DC | PRN
  Administered 2018-02-07 – 2018-02-08 (×2): 1 via ORAL
  Administered 2018-02-08 – 2018-02-10 (×5): 2 via ORAL
  Filled 2018-02-07 (×2): qty 2
  Filled 2018-02-07: qty 1
  Filled 2018-02-07 (×2): qty 2
  Filled 2018-02-07: qty 1
  Filled 2018-02-07: qty 2

## 2018-02-07 MED ORDER — FUROSEMIDE 40 MG PO TABS
40.0000 mg | ORAL_TABLET | Freq: Two times a day (BID) | ORAL | Status: DC
  Administered 2018-02-08 – 2018-02-10 (×5): 40 mg via ORAL
  Filled 2018-02-07 (×5): qty 1

## 2018-02-07 MED ORDER — IPRATROPIUM-ALBUTEROL 0.5-2.5 (3) MG/3ML IN SOLN
3.0000 mL | Freq: Four times a day (QID) | RESPIRATORY_TRACT | Status: DC
  Administered 2018-02-08: 3 mL via RESPIRATORY_TRACT
  Filled 2018-02-07: qty 3

## 2018-02-07 MED ORDER — ENOXAPARIN SODIUM 40 MG/0.4ML ~~LOC~~ SOLN
40.0000 mg | Freq: Two times a day (BID) | SUBCUTANEOUS | Status: DC
  Administered 2018-02-07 – 2018-02-10 (×6): 40 mg via SUBCUTANEOUS
  Filled 2018-02-07 (×6): qty 0.4

## 2018-02-07 MED ORDER — NICOTINE 21 MG/24HR TD PT24
21.0000 mg | MEDICATED_PATCH | Freq: Every day | TRANSDERMAL | Status: DC
  Administered 2018-02-07 – 2018-02-10 (×4): 21 mg via TRANSDERMAL
  Filled 2018-02-07 (×4): qty 1

## 2018-02-07 MED ORDER — POTASSIUM CHLORIDE CRYS ER 20 MEQ PO TBCR: 40.0000 meq | EXTENDED_RELEASE_TABLET | Freq: Once | ORAL | Status: DC

## 2018-02-07 MED ORDER — SERTRALINE HCL 50 MG PO TABS
200.0000 mg | ORAL_TABLET | ORAL | Status: DC
  Administered 2018-02-08 – 2018-02-10 (×3): 200 mg via ORAL
  Filled 2018-02-07 (×3): qty 4

## 2018-02-07 MED ORDER — FAMOTIDINE 20 MG PO TABS
10.0000 mg | ORAL_TABLET | Freq: Every day | ORAL | Status: DC
  Administered 2018-02-08 – 2018-02-10 (×3): 10 mg via ORAL
  Filled 2018-02-07 (×3): qty 1

## 2018-02-07 MED ORDER — ALPRAZOLAM 0.5 MG PO TABS
0.5000 mg | ORAL_TABLET | Freq: Two times a day (BID) | ORAL | Status: DC | PRN
  Administered 2018-02-08 – 2018-02-10 (×4): 0.5 mg via ORAL
  Filled 2018-02-07 (×5): qty 1

## 2018-02-07 MED ORDER — POTASSIUM CHLORIDE CRYS ER 20 MEQ PO TBCR
20.0000 meq | EXTENDED_RELEASE_TABLET | Freq: Two times a day (BID) | ORAL | Status: DC
  Administered 2018-02-07 – 2018-02-10 (×6): 20 meq via ORAL
  Filled 2018-02-07 (×6): qty 1

## 2018-02-07 MED ORDER — BISOPROLOL-HYDROCHLOROTHIAZIDE 10-6.25 MG PO TABS
1.0000 | ORAL_TABLET | Freq: Every day | ORAL | Status: DC
  Administered 2018-02-08 – 2018-02-10 (×3): 1 via ORAL
  Filled 2018-02-07 (×3): qty 1

## 2018-02-07 MED ORDER — ROFLUMILAST 500 MCG PO TABS
500.0000 ug | ORAL_TABLET | Freq: Every day | ORAL | Status: DC
  Administered 2018-02-08 – 2018-02-10 (×3): 500 ug via ORAL
  Filled 2018-02-07 (×3): qty 1

## 2018-02-07 MED ORDER — SENNOSIDES-DOCUSATE SODIUM 8.6-50 MG PO TABS: 1.0000 | ORAL_TABLET | Freq: Every evening | ORAL | Status: DC | PRN

## 2018-02-07 MED ORDER — GABAPENTIN 300 MG PO CAPS
300.0000 mg | ORAL_CAPSULE | Freq: Every day | ORAL | Status: DC
  Administered 2018-02-07 – 2018-02-09 (×3): 300 mg via ORAL
  Filled 2018-02-07 (×3): qty 1

## 2018-02-07 MED ORDER — ONDANSETRON HCL 4 MG/2ML IJ SOLN
4.0000 mg | Freq: Four times a day (QID) | INTRAMUSCULAR | Status: DC | PRN
Start: 2018-02-07 — End: 2018-02-10

## 2018-02-07 MED ORDER — ONDANSETRON HCL 4 MG PO TABS: 4.0000 mg | ORAL_TABLET | Freq: Four times a day (QID) | ORAL | Status: DC | PRN

## 2018-02-07 MED ORDER — SODIUM CHLORIDE 0.9% FLUSH
3.0000 mL | Freq: Two times a day (BID) | INTRAVENOUS | Status: DC
  Administered 2018-02-07 – 2018-02-10 (×6): 3 mL via INTRAVENOUS

## 2018-02-07 MED ORDER — SODIUM CHLORIDE 0.9 % IV SOLN: 250.0000 mL | INTRAVENOUS | Status: DC | PRN

## 2018-02-07 MED ORDER — IPRATROPIUM-ALBUTEROL 0.5-2.5 (3) MG/3ML IN SOLN
9.0000 mL | Freq: Once | RESPIRATORY_TRACT | Status: AC
  Administered 2018-02-07: 9 mL via RESPIRATORY_TRACT
  Filled 2018-02-07: qty 9

## 2018-02-07 MED ORDER — ACETAMINOPHEN 325 MG PO TABS
650.0000 mg | ORAL_TABLET | Freq: Four times a day (QID) | ORAL | Status: DC | PRN
  Filled 2018-02-07: qty 2

## 2018-02-07 MED ORDER — ALBUTEROL SULFATE (2.5 MG/3ML) 0.083% IN NEBU
2.5000 mg | INHALATION_SOLUTION | RESPIRATORY_TRACT | Status: DC | PRN
  Administered 2018-02-09 – 2018-02-10 (×2): 2.5 mg via RESPIRATORY_TRACT
  Filled 2018-02-07 (×2): qty 3

## 2018-02-07 MED ORDER — ALBUTEROL SULFATE (2.5 MG/3ML) 0.083% IN NEBU
5.0000 mg | INHALATION_SOLUTION | Freq: Once | RESPIRATORY_TRACT | Status: AC
  Administered 2018-02-07: 5 mg via RESPIRATORY_TRACT

## 2018-02-07 MED ORDER — ALBUTEROL SULFATE (2.5 MG/3ML) 0.083% IN NEBU
7.5000 mg | INHALATION_SOLUTION | Freq: Once | RESPIRATORY_TRACT | Status: AC
  Administered 2018-02-07: 7.5 mg via RESPIRATORY_TRACT
  Filled 2018-02-07: qty 9

## 2018-02-07 MED ORDER — METHYLPREDNISOLONE SODIUM SUCC 125 MG IJ SOLR
125.0000 mg | Freq: Once | INTRAMUSCULAR | Status: AC
  Administered 2018-02-07: 125 mg via INTRAVENOUS
  Filled 2018-02-07: qty 2

## 2018-02-07 MED ORDER — ALBUTEROL SULFATE (2.5 MG/3ML) 0.083% IN NEBU
INHALATION_SOLUTION | RESPIRATORY_TRACT | Status: AC
  Filled 2018-02-07: qty 3

## 2018-02-07 MED ORDER — DOXYCYCLINE HYCLATE 100 MG PO TABS
100.0000 mg | ORAL_TABLET | Freq: Two times a day (BID) | ORAL | Status: DC
  Administered 2018-02-07 – 2018-02-08 (×3): 100 mg via ORAL
  Filled 2018-02-07 (×4): qty 1

## 2018-02-07 NOTE — Progress Notes (Signed)
Advanced care plan.  Purpose of the Encounter: CODE STATUS  Parties in Attendance:Patient Patient's Decision Capacity:Good Subjective/Patient's story: Presented for shortness of breath and wheezing Objective/Medical story Has COPD exacerbation with low oxygen saturation Goals of care determination:  Advanced directives discussed Patient has chronic respiratory failure oxygen dependent For now she wants everything done cardiac resuscitation, intubation and ventilator if the need arises CODE STATUS: Full code Time spent discussing advanced care planning: 16 minutes

## 2018-02-07 NOTE — ED Triage Notes (Signed)
Pt comes into the ED via POV c/o shortness of breath associated with her COPD.  Patient states that it started Sunday and has progressively gotten worse.  Patient has audible wheezing and has only done 1 treatment at home.  Patient able to speak in full sentences at this time.  Patient denies any N/V or dizziness but states she has chest pain associated with the shortness of breath.

## 2018-02-07 NOTE — Progress Notes (Signed)
Anticoagulation monitoring(Lovenox):   62 yo female ordered Lovenox 40 mg Q24h  Filed Weights   02/07/18 1413 02/07/18 2125  Weight: 236 lb (107 kg) 234 lb 9.6 oz (106.4 kg)   BMI 41.57    Lab Results  Component Value Date   CREATININE 0.62 02/07/2018   CREATININE 0.90 12/11/2017   CREATININE 1.04 (H) 12/10/2017   Estimated Creatinine Clearance: 87.4 mL/min (by C-G formula based on SCr of 0.62 mg/dL). Hemoglobin & Hematocrit     Component Value Date/Time   HGB 14.7 02/07/2018 1421   HGB 13.8 12/22/2017 1637   HCT 43.6 02/07/2018 1421   HCT 43.2 12/22/2017 1637     Per Protocol for Patient with estCrcl > 30 ml/min and BMI >40, will transition to Lovenox 40 mg Q12h.

## 2018-02-07 NOTE — H&P (Addendum)
[place] [facility] [place] NAME: Theresa [name]    MR#:  [id number]  DATE OF BIRTH:  [birth year]  DATE OF ADMISSION:  02/07/2018  PRIMARY CARE PHYSICIAN: Chrismon, Vickki Muff, PA   REQUESTING/REFERRING PHYSICIAN:   CHIEF COMPLAINT:   Chief Complaint  Patient presents with  . COPD    HISTORY OF PRESENT ILLNESS: Theresa [name]  is a 62 y.o. female with a known history of COPD, congestive heart failure, anxiety disorder, hypertension presented to the emergency room with increasing wheezing and shortness of breath.  This has been going on for the last 3 days since Sunday.  Patient tried inhaler treatments at home but did not help.  She had increased wheezing and had to come to the emergency room.  No complains of any chest pain.  Has cough which is dry in nature.  No fever and chills.  Patient is on oxygen at home.  Still actively smokes.  PAST MEDICAL HISTORY:   Past Medical History:  Diagnosis Date  . Acute postoperative pain 10/03/2017  . Anxiety   . Carpal tunnel syndrome   . CHF (congestive heart failure) ([place])    1/18  . CHF (congestive heart failure) ([place])   . Chronic generalized abdominal pain   . Chronic rhinitis   . COPD (chronic obstructive pulmonary disease) ([place])   . DDD (degenerative disc disease), cervical   . Depression   . Edema   . Flu    1/18  . Gastritis   . Hematuria   . Hypertension   . Kidney stones   . Low back pain   . Lumbar radiculitis   . Malodorous urine   . Muscle weakness   . Obesity   . Renal cyst   . Sensory urge incontinence   . Tobacco abuse     PAST SURGICAL HISTORY:  Past Surgical History:  Procedure Laterality Date  . ABDOMINAL HYSTERECTOMY    . CARPAL TUNNEL RELEASE Left   . TONSILLECTOMY    . TUBAL LIGATION      SOCIAL HISTORY:  Social History   Tobacco Use  . Smoking status: Current Every Day Smoker    Packs/day: 1.00    Years: 44.00    Pack years: 44.00    Types:  Cigarettes  . Smokeless tobacco: Never Used  Substance Use Topics  . Alcohol use: No    Frequency: Never    FAMILY HISTORY:  Family History  Problem Relation Age of Onset  . Heart disease Mother   . Stroke Mother   . Coronary artery disease Mother   . Lung cancer Sister   . Alcohol abuse Father   . Heart disease Father   . Prostate cancer Neg Hx   . Kidney cancer Neg Hx   . Bladder Cancer Neg Hx     DRUG ALLERGIES:  Allergies  Allergen Reactions  . Codeine Nausea And Vomiting  . Meloxicam Other (See Comments)    Stomach pain    REVIEW OF SYSTEMS:   CONSTITUTIONAL: No fever, fatigue or weakness.  EYES: No blurred or double vision.  EARS, NOSE, AND THROAT: No tinnitus or ear pain.  RESPIRATORY: Has cough, shortness of breath, wheezing  no hemoptysis.  CARDIOVASCULAR: No chest pain, orthopnea, edema.  GASTROINTESTINAL: No nausea, vomiting, diarrhea or abdominal pain.  GENITOURINARY: No dysuria, hematuria.  ENDOCRINE: No polyuria, nocturia,  HEMATOLOGY: No anemia, easy bruising or bleeding SKIN: No rash or lesion. MUSCULOSKELETAL: No joint pain or  arthritis.   NEUROLOGIC: No tingling, numbness, weakness.  PSYCHIATRY: No anxiety or depression.   MEDICATIONS AT HOME:  Prior to Admission medications   Medication Sig Start Date End Date Taking? Authorizing Provider  ADVAIR DISKUS 250-50 MCG/DOSE AEPB Inhale 1 puff into the lungs 2 (two) times daily. 11/29/17  Yes [provider]  albuterol (PROAIR HFA) 108 (90 Base) MCG/ACT inhaler INHALE 2 PUFFS BY MOUTH EVERY 6 HOURS AS NEEDED FOR WHEEZING 12/01/17  Yes [provider]  ALPRAZolam (XANAX) 0.5 MG tablet Take 1 tablet (0.5 mg total) by mouth 2 (two) times daily as needed for anxiety. 12/09/16  Yes Hackney, Tina A, FNP  Aspirin-Caffeine (BC FAST PAIN RELIEF ARTHRITIS) 1000-65 MG PACK Take 1 packet by mouth every 6 (six) hours as needed (Pain).   Yes [provider]  bisoprolol-hydrochlorothiazide  (ZIAC) 10-6.25 MG tablet Take 1 tablet by mouth daily. 12/05/17  Yes [provider]  Cholecalciferol ([employer] D3) 2000 units CAPS Take 1 capsule by mouth daily.   Yes [provider]  DALIRESP 500 MCG TABS tablet Take 500 mcg by mouth daily. 12/05/17  Yes [provider]  Fluticasone-Salmeterol (ADVAIR DISKUS) 250-50 MCG/DOSE AEPB INHALE 1 PUFF INTO LUNGS TWICE A DAY 05/23/16  Yes [provider]  gabapentin (NEURONTIN) 100 MG capsule Take 1-3 capsules (100-300 mg total) by mouth at bedtime. Follow written titration schedule. 01/31/18 05/01/18 Yes Vevelyn Francois, NP  HYDROcodone-acetaminophen (NORCO/VICODIN) 5-325 MG tablet Take 1 tablet by mouth every 8 (eight) hours as needed for severe pain. 04/21/18 05/21/18 Yes King, Diona Foley, NP  ipratropium-albuterol (DUONEB) 0.5-2.5 (3) MG/3ML SOLN Inhale 3 mLs into the lungs every 6 (six) hours as needed for shortness of breath. 11/15/17  Yes [provider]  OXYGEN Inhale 2.5 L into the lungs continuous. Reported on 11/03/2015   Yes [provider]  potassium chloride SA (K-DUR,KLOR-CON) 20 MEQ tablet Take 20 mEq by mouth 2 (two) times daily. 12/05/17  Yes [provider]  ranitidine (ZANTAC) 150 MG tablet Take 150 mg by mouth 2 (two) times daily.   Yes [provider]  sertraline (ZOLOFT) 100 MG tablet Take 2 tablets (200 mg total) by mouth every morning. 01/15/18  Yes Chrismon, Vickki Muff, PA  VENTOLIN HFA 108 (90 Base) MCG/ACT inhaler Inhale 2 puffs into the lungs every 6 (six) hours as needed for shortness of breath. 11/15/17  Yes [provider]  furosemide (LASIX) 40 MG tablet Take 40 mg by mouth 2 (two) times daily. 12/05/17   [provider]  HYDROcodone-acetaminophen (NORCO/VICODIN) 5-325 MG tablet Take 1 tablet by mouth every 8 (eight) hours as needed for severe pain. Patient not taking: Reported on 02/07/2018 03/22/18 04/21/18  Vevelyn Francois, NP  HYDROcodone-acetaminophen  (NORCO/VICODIN) 5-325 MG tablet Take 1 tablet by mouth every 8 (eight) hours as needed for severe pain. Patient not taking: Reported on 02/07/2018 02/20/18 03/22/18  Vevelyn Francois, NP      PHYSICAL EXAMINATION:   VITAL SIGNS: Blood pressure 126/83, pulse 94, temperature 98.8 F (37.1 C), temperature source Oral, resp. rate (!) 22, height _0  (1.6 m), weight 107 kg (236 lb), SpO2 94 %.  GENERAL:  62 y.o.-year-old patient lying in the bed with no acute distress.  EYES: Pupils equal, round, reactive to light and accommodation. No scleral icterus. Extraocular muscles intact.  HEENT: Head atraumatic, normocephalic. Oropharynx and nasopharynx clear.  NECK:  Supple, no jugular venous distention. No thyroid enlargement, no tenderness.  LUNGS: Decreased  breath sounds bilaterally, bilateral wheezing, rale. No use of accessory muscles of respiration.  CARDIOVASCULAR: S1, S2 normal. No murmurs, rubs, or gallops.  ABDOMEN: Soft, nontender, nondistended. Bowel sounds present. No organomegaly or mass.  EXTREMITIES: No pedal edema, cyanosis, or clubbing.  NEUROLOGIC: Cranial nerves II through XII are intact. Muscle strength 5/5 in all extremities. Sensation intact. Gait not checked.  PSYCHIATRIC: The patient is alert and oriented x 3.  SKIN: No obvious rash, lesion, or ulcer.   LABORATORY PANEL:   CBC Recent Labs  Lab 02/07/18 1421  WBC 10.5  HGB 14.7  HCT 43.6  PLT 135*  MCV 97.6  MCH 33.0  MCHC 33.8  RDW 15.1*   ------------------------------------------------------------------------------------------------------------------  Chemistries  Recent Labs  Lab 02/07/18 1421  NA 138  K 3.4*  CL 102  CO2 27  GLUCOSE 108*  BUN 14  CREATININE 0.62  CALCIUM 9.1   ------------------------------------------------------------------------------------------------------------------ estimated creatinine clearance is 87.6 mL/min (by C-G formula based on SCr of 0.62  mg/dL). ------------------------------------------------------------------------------------------------------------------ No results for input(s): TSH, T4TOTAL, T3FREE, THYROIDAB in the last 72 hours.  Invalid input(s): FREET3   Coagulation profile No results for input(s): INR, PROTIME in the last 168 hours. ------------------------------------------------------------------------------------------------------------------- No results for input(s): DDIMER in the last 72 hours. -------------------------------------------------------------------------------------------------------------------  Cardiac Enzymes Recent Labs  Lab 02/07/18 1421  TROPONINI <0.03   ------------------------------------------------------------------------------------------------------------------ Invalid input(s): POCBNP  ---------------------------------------------------------------------------------------------------------------  Urinalysis    Component Value Date/Time   COLORURINE YELLOW 12/09/2017 2201   APPEARANCEUR CLEAR 12/09/2017 2201   APPEARANCEUR Cloudy (A) 11/28/2016 1128   LABSPEC 1.028 12/09/2017 2201   LABSPEC 1.011 06/12/2014 2003   PHURINE 5.0 12/09/2017 2201   GLUCOSEU NEGATIVE 12/09/2017 2201   GLUCOSEU Negative 06/12/2014 2003   HGBUR NEGATIVE 12/09/2017 2201   BILIRUBINUR NEGATIVE 12/09/2017 2201   BILIRUBINUR Negative 11/28/2016 1128   BILIRUBINUR Negative 06/12/2014 2003   KETONESUR NEGATIVE 12/09/2017 2201   PROTEINUR NEGATIVE 12/09/2017 2201   UROBILINOGEN 0.2 10/08/2015 1526   UROBILINOGEN 0.2 07/30/2013 1305   NITRITE NEGATIVE 12/09/2017 2201   LEUKOCYTESUR NEGATIVE 12/09/2017 2201   LEUKOCYTESUR Negative 11/28/2016 1128   LEUKOCYTESUR Negative 06/12/2014 2003     RADIOLOGY: Dg Chest 2 View  Result Date: 02/07/2018 CLINICAL DATA:  Increasing shortness of breath over the past couple of days. EXAM: CHEST - 2 VIEW COMPARISON:  CT chest 02/01/2018. Single-view of the  chest 12/11/2017 and 02/15/2017. FINDINGS: The lungs are emphysematous but clear. Heart size is normal. Aortic atherosclerosis is noted. No pneumothorax or pleural effusion. No acute bony abnormality. IMPRESSION: Emphysema without acute disease. Atherosclerosis. Electronically Signed   By: Inge Rise M.D.   On: 02/07/2018 16:57    EKG: Orders placed or performed during the hospital encounter of 02/07/18  . ED EKG  . ED EKG    IMPRESSION AND PLAN:  62 year old female patient with history of congestive heart failure, emphysema, on home oxygen, hypertension, anxiety disorder presented to the emergency room with shortness of breath and wheezing.  Acute COPD exacerbation Nebulization treatments Oxygen via nasal cannula Start patient on IV Solu-Medrol Oral doxycycline antibiotic  Acute on chronic respiratory failure with hypoxia Continue oxygen via nasal cannula  Anxiety disorder Anxiolytic medication  Chronic congestive heart failure Continue Lasix for diuresis  Hypokalemia Replace potassium  DVT prophylaxis with subcu Lovenox daily  Tobacco cessation consult for 6 minutes Nicotine patch offered  All the records are reviewed and case discussed with ED provider. Management plans discussed with the patient, family and they are in  agreement.  CODE STATUS:Full code Code Status History    Date Active Date Inactive Code Status Order ID Comments User Context   12/09/2017 2355 12/11/2017 1616 Full Code [id number]  Omar Person, NP ED   02/13/2017 2039 02/16/2017 1411 Full Code [id number]  Demetrios Loll, MD ED   10/25/2016 0346 11/03/2016 1636 Full Code [id number]  Awilda Bill, NP ED   11/03/2015 0245 11/04/2015 1428 Full Code [id number]  Harrie Foreman, MD Inpatient   06/29/2015 0312 07/04/2015 1555 Full Code [id number]  Hower, Aaron Mose, MD ED   04/18/2015 0559 04/21/2015 2146 Full Code [id number]  Harrie Foreman, MD Inpatient       TOTAL TIME TAKING CARE OF THIS PATIENT: 50  minutes.    Saundra Shelling M.D on 02/07/2018 at 8:38 PM  Between 7am to 6pm - Pager - [phone]  After 6pm go to [email or web address] - password [email or web address] [facility]  Office  [phone]  CC: Primary care physician; Margo Common, PA

## 2018-02-07 NOTE — ED Provider Notes (Signed)
[facility] Emergency Department Provider Note  ____________________________________________   First MD Initiated Contact with Patient 02/07/18 1806     (approximate)  I have reviewed the triage vital signs and the nursing notes.   HISTORY  Chief Complaint COPD   HPI Theresa [name] is a 62 y.o. female with a history of COPD and CHF on 2-1/2 L of nasal cannula oxygen at home with 4 days of worsening shortness of breath.  She says that she has had a cough as well with production of clear sputum.  Says that spring through summer is the worst time for COPD because of the pollen and she thinks this is likely the cause of her worsening shortness of breath at this time.   Past Medical History:  Diagnosis Date  . Acute postoperative pain 10/03/2017  . Anxiety   . Carpal tunnel syndrome   . CHF (congestive heart failure) ([place])    1/18  . CHF (congestive heart failure) ([place])   . Chronic generalized abdominal pain   . Chronic rhinitis   . COPD (chronic obstructive pulmonary disease) ([place])   . DDD (degenerative disc disease), cervical   . Depression   . Edema   . Flu    1/18  . Gastritis   . Hematuria   . Hypertension   . Kidney stones   . Low back pain   . Lumbar radiculitis   . Malodorous urine   . Muscle weakness   . Obesity   . Renal cyst   . Sensory urge incontinence   . Tobacco abuse     Patient Active Problem List   Diagnosis Date Noted  . Chronic lower extremity pain (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) 01/25/2018  . Altered mental status, unspecified 12/09/2017  . Acute respiratory failure ([place]) 12/09/2017  . Chronic hip pain [facility] Area of Pain) (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) 11/27/2017  . COPD (chronic obstructive pulmonary disease) ([place]) 08/11/2017  . Other long term (current) drug therapy 08/08/2017  . Disorder of bone, unspecified 08/08/2017  . COPD with hypoxia ([place]) 06/27/2017  . Arthralgia of acromioclavicular joint (Right) 06/15/2017  .  Acromioclavicular joint DJD (Right) 06/15/2017  . 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  . Chronic shoulder pain (Left) 05/11/2017  . Elevated brain natriuretic peptide (BNP) level 05/11/2017  . Grief at loss of child 02/14/2017  . Lumbar facet hypertrophy (multilevel) 11/17/2016  . Lumbar spinal stenosis (L4-5) 11/17/2016  . 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) 11/17/2016  . 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 Lumbosacral radiculopathy at L4 11/17/2016  . Supplemental oxygen dependent 08/01/2016  . Sleep apnea 06/02/2016  . Long term current use of opiate analgesic 01/12/2016  . Long term prescription opiate use 01/12/2016  . Opiate use (15 MME/Day) 01/12/2016  . Lumbar facet syndrome (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) 01/12/2016  . Chronic sacroiliac joint pain (Bilateral) 01/12/2016  . Vitamin D deficiency 12/30/2015  . 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 Obesity, Class III, BMI 40-49.9 (morbid obesity) ([place]) 12/28/2015  . 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[place]) 11/19/2015  . Coagulation disorder ([place]) 10/22/2015  . Chronic pain syndrome (significant psychosocial component) 09/21/2015  . 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 09/21/2015  . Encounter for therapeutic drug level monitoring 09/16/2015  . Encounter for chronic pain management 09/16/2015  . Pain management 09/16/2015  . Neurogenic pain 09/16/2015  . Neuropathic pain 09/16/2015  . Musculoskeletal pain 09/16/2015  . Lumbar spondylosis (L4-5) 09/16/2015  . Chronic low back pain (Primary  Area of Pain) (Left) 09/16/2015  . Chronic lower extremity pain (Secondary Area of Pain) (Left) 09/16/2015  . Chronic lumbar radicular pain (L4 & S1 Dermatome) (Left) 09/16/2015  . Chronic shoulder pain (Right side) 09/16/2015  . Chronic upper extremity pain (Right-sided) 09/16/2015  . Cervical radiculitis (Right side) 09/16/2015  . Abnormal x-ray of  lumbar spine 09/16/2015  . Chronic diastolic heart failure ([place]) 07/15/2015  . Chest pain 07/15/2015  . Tobacco abuse 07/15/2015  . Blood clotting disorder ([place]) 04/21/2015  . Decreased motor strength 07/16/2014  . Clinical depression 07/16/2014  . Chronic rhinitis 07/16/2014  . Carpal tunnel syndrome 07/16/2014  . Anxiety state 07/16/2014  . Neuritis or radiculitis due to rupture of lumbar intervertebral disc 07/02/2014  . DDD (degenerative disc disease), lumbar 07/02/2014  . CAFL (chronic airflow limitation) ([place]) 02/27/2014  . Essential (primary) hypertension 03/27/2008    Past Surgical History:  Procedure Laterality Date  . ABDOMINAL HYSTERECTOMY    . CARPAL TUNNEL RELEASE Left   . TONSILLECTOMY    . TUBAL LIGATION      Prior to Admission medications   Medication Sig Start Date End Date Taking? Authorizing Provider  ADVAIR DISKUS 250-50 MCG/DOSE AEPB Inhale 1 puff into the lungs 2 (two) times daily. 11/29/17   [provider]  albuterol (PROAIR HFA) 108 (90 Base) MCG/ACT inhaler INHALE 2 PUFFS BY MOUTH EVERY 6 HOURS AS NEEDED FOR WHEEZING 12/01/17   [provider]  ALPRAZolam Duanne Moron) 0.5 MG tablet Take 1 tablet (0.5 mg total) by mouth 2 (two) times daily as needed for anxiety. 12/09/16   Darylene Price A, FNP  Aspirin-Caffeine (BC FAST PAIN RELIEF ARTHRITIS) 1000-65 MG PACK Take 1 packet by mouth every 6 (six) hours as needed (Pain).    [provider]  bisoprolol-hydrochlorothiazide (ZIAC) 10-6.25 MG tablet Take 1 tablet by mouth daily. 12/05/17   [provider]  Cholecalciferol ([employer] D3) 2000 units CAPS Take 1 capsule by mouth daily.    [provider]  DALIRESP 500 MCG TABS tablet Take 500 mcg by mouth daily. 12/05/17   [provider]  Fluticasone-Salmeterol (ADVAIR DISKUS) 250-50 MCG/DOSE AEPB INHALE 1 PUFF INTO LUNGS TWICE A DAY 05/23/16   [provider]  furosemide (LASIX) 40 MG tablet Take 40 mg by mouth 2 (two)  times daily. 12/05/17   [provider]  gabapentin (NEURONTIN) 100 MG capsule Take 1-3 capsules (100-300 mg total) by mouth at bedtime. Follow written titration schedule. 01/31/18 05/01/18  Vevelyn Francois, NP  HYDROcodone-acetaminophen (NORCO/VICODIN) 5-325 MG tablet Take 1 tablet by mouth every 8 (eight) hours as needed for severe pain. 04/21/18 05/21/18  Vevelyn Francois, NP  HYDROcodone-acetaminophen (NORCO/VICODIN) 5-325 MG tablet Take 1 tablet by mouth every 8 (eight) hours as needed for severe pain. 03/22/18 04/21/18  Vevelyn Francois, NP  HYDROcodone-acetaminophen (NORCO/VICODIN) 5-325 MG tablet Take 1 tablet by mouth every 8 (eight) hours as needed for severe pain. 02/20/18 03/22/18  Vevelyn Francois, NP  ipratropium-albuterol (DUONEB) 0.5-2.5 (3) MG/3ML SOLN Inhale 3 mLs into the lungs every 6 (six) hours as needed for shortness of breath. 11/15/17   [provider]  OXYGEN Inhale 2.5 L into the lungs continuous. Reported on 11/03/2015    [provider]  potassium chloride SA (K-DUR,KLOR-CON) 20 MEQ tablet Take 20 mEq by mouth 2 (two) times daily. 12/05/17   [provider]  ranitidine (ZANTAC) 150 MG tablet Take 150 mg by mouth 2 (two) times daily.  [provider]  sertraline (ZOLOFT) 100 MG tablet Take 2 tablets (200 mg total) by mouth every morning. 01/15/18   Chrismon, Vickki Muff, PA  VENTOLIN HFA 108 (90 Base) MCG/ACT inhaler Inhale 2 puffs into the lungs every 6 (six) hours as needed for shortness of breath. 11/15/17   [provider]    Allergies Codeine and Meloxicam  Family History  Problem Relation Age of Onset  . Heart disease Mother   . Stroke Mother   . Coronary artery disease Mother   . Lung cancer Sister   . Alcohol abuse Father   . Heart disease Father   . Prostate cancer Neg Hx   . Kidney cancer Neg Hx   . Bladder Cancer Neg Hx     Social History Social History   Tobacco Use  . Smoking status: Current Every Day Smoker     Packs/day: 1.00    Years: 44.00    Pack years: 44.00    Types: Cigarettes  . Smokeless tobacco: Never Used  Substance Use Topics  . Alcohol use: No    Frequency: Never  . Drug use: No    Review of Systems  Constitutional: No fever/chills Eyes: No visual changes. ENT: No sore throat. Cardiovascular: Denies chest pain. Respiratory: As above  gastrointestinal: No abdominal pain.  No nausea, no vomiting.  No diarrhea.  No constipation. Genitourinary: Negative for dysuria. Musculoskeletal: Negative for back pain. Skin: Negative for rash. Neurological: Negative for headaches, focal weakness or numbness.   ____________________________________________   PHYSICAL EXAM:  VITAL SIGNS: ED Triage Vitals  Enc Vitals Group     BP 02/07/18 1414 (!) 97/45     Pulse Rate 02/07/18 1414 (!) 3     Resp 02/07/18 1414 (!) 22     Temp 02/07/18 1414 98.8 F (37.1 C)     Temp Source 02/07/18 1414 Oral     SpO2 02/07/18 1414 92 %     Weight 02/07/18 1413 236 lb (107 kg)     Height 02/07/18 1413 _0  (1.6 m)     Head Circumference --      Peak Flow --      Pain Score 02/07/18 1419 6     Pain Loc --      Pain Edu? --      Excl. in [place]? --     Constitutional: Alert and oriented.  No acute distress.  However, wheezing is audible at the bedside without a stethoscope. Eyes: Conjunctivae are normal.  Head: Atraumatic. Nose: No congestion/rhinnorhea. Mouth/Throat: Mucous membranes are moist.  Neck: No stridor.   Cardiovascular: Normal rate, regular rhythm. Grossly normal heart sounds.   Respiratory: Tachypneic with minimal supraclavicular retractions.  Prolonged expiratory phase with diffuse wheezing and decreased air movement.  Patient coughed several times while I am examining her but does not produce any sputum.  Gastrointestinal: Soft and nontender. No distention.  Musculoskeletal: No lower extremity tenderness nor edema.  No joint effusions. Neurologic:  Normal speech and language. No  gross focal neurologic deficits are appreciated. Skin:  Skin is warm, dry and intact. No rash noted. Psychiatric: Mood and affect are normal. Speech and behavior are normal.  ____________________________________________   LABS (all labs ordered are listed, but only abnormal results are displayed)  Labs Reviewed  BASIC METABOLIC PANEL - Abnormal; Notable for the following components:      Result Value   Potassium 3.4 (*)    Glucose, Bld 108 (*)    All other components  within normal limits  CBC - Abnormal; Notable for the following components:   RDW 15.1 (*)    Platelets 135 (*)    All other components within normal limits  TROPONIN I   ____________________________________________  EKG  ED ECG REPORT I, Doran Stabler, the attending physician, personally viewed and interpreted this ECG.   Date: 02/07/2018  EKG Time: 1425  Rate: 90  Rhythm: normal sinus rhythm  Axis: Normal  Intervals:none  ST&T Change: No ST segment elevation or depression.  No abnormal T wave inversion.  ____________________________________________  RADIOLOGY  COPD without any acute process otherwise. ____________________________________________   PROCEDURES  Procedure(s) performed:   Procedures  Critical Care performed:   ____________________________________________   INITIAL IMPRESSION / ASSESSMENT AND PLAN / ED COURSE  Pertinent labs & imaging results that were available during my care of the patient were reviewed by me and considered in my medical decision making (see chart for details).  Differential includes, but is not limited to, viral syndrome, bronchitis including COPD exacerbation, pneumonia, reactive airway disease including asthma, CHF including exacerbation with or without pulmonary/interstitial edema, pneumothorax, ACS, thoracic trauma, and pulmonary embolism. As part of my medical decision making, I reviewed the following data within the electronic MEDICAL RECORD NUMBER Notes  from prior ED visits  ----------------------------------------- 7:59 PM on 02/07/2018 -----------------------------------------  Patient persistently wheezing despite 3 duo nebs and 1 albuterol.  Also with persistently labored respirations.  Patient says that she feels slightly improved.  However, she is in no condition to be discharged home at this time.  Will be admitted to the hospital.  Signed out to Dr. Estanislado Pandy. ____________________________________________   FINAL CLINICAL IMPRESSION(S) / ED DIAGNOSES  COPD exacerbation.    NEW MEDICATIONS STARTED DURING THIS VISIT:  New Prescriptions   No medications on file     Note:  This document was prepared using Dragon voice recognition software and may include unintentional dictation errors.     Orbie Pyo, MD 02/07/18 [phone]

## 2018-02-08 LAB — BASIC METABOLIC PANEL
Anion gap: 8 (ref 5–15)
BUN: 16 mg/dL (ref 6–20)
CO2: 27 mmol/L (ref 22–32)
Calcium: 9.1 mg/dL (ref 8.9–10.3)
Chloride: 106 mmol/L (ref 101–111)
Creatinine, Ser: 0.59 mg/dL (ref 0.44–1.00)
GFR calc Af Amer: >60 mL/min (ref >60)
GFR calc non Af Amer: >60 mL/min (ref >60)
Glucose, Bld: 158 mg/dL — ABNORMAL HIGH (ref 65–99)
Potassium: 4.1 mmol/L (ref 3.5–5.1)
Sodium: 141 mmol/L (ref 135–145)

## 2018-02-08 LAB — CBC
HCT: 43.8 % (ref 35.0–47.0)
Hemoglobin: 14.5 g/dL (ref 12.0–16.0)
MCH: 32.6 pg (ref 26.0–34.0)
MCHC: 33.1 g/dL (ref 32.0–36.0)
MCV: 98.5 fL (ref 80.0–100.0)
Platelets: 146 10*3/uL — ABNORMAL LOW (ref 150–440)
RBC: 4.44 MIL/uL (ref 3.80–5.20)
RDW: 15.6 % — ABNORMAL HIGH (ref 11.5–14.5)
WBC: 5.1 10*3/uL (ref 3.6–11.0)

## 2018-02-08 MED ORDER — IPRATROPIUM-ALBUTEROL 0.5-2.5 (3) MG/3ML IN SOLN
3.0000 mL | Freq: Three times a day (TID) | RESPIRATORY_TRACT | Status: DC
  Administered 2018-02-08 – 2018-02-10 (×7): 3 mL via RESPIRATORY_TRACT
  Filled 2018-02-08 (×8): qty 3

## 2018-02-08 NOTE — Plan of Care (Signed)

## 2018-02-08 NOTE — Progress Notes (Signed)
[place] [facility] [place] NAME: Theresa [name]    MR#:  [id number]  DATE OF BIRTH:  1956/06/12  SUBJECTIVE:  CHIEF COMPLAINT:   Chief Complaint  Patient presents with  . COPD    Has history of COPD and on home oxygen with nebulizers, last 2-3 days has worsening shortness of breath and did not get improvement in spite of using treatment as outpatient. On 3 L oxygen at baseline and uses BiPAP at night, currently feels slightly better than yesterday but still significant short of breath on minimal exertion.  REVIEW OF SYSTEMS:  CONSTITUTIONAL: No fever, fatigue or weakness.  EYES: No blurred or double vision.  EARS, NOSE, AND THROAT: No tinnitus or ear pain.  RESPIRATORY: No cough, have shortness of breath, wheezing ,no hemoptysis.  CARDIOVASCULAR: No chest pain, orthopnea, edema.  GASTROINTESTINAL: No nausea, vomiting, diarrhea or abdominal pain.  GENITOURINARY: No dysuria, hematuria.  ENDOCRINE: No polyuria, nocturia,  HEMATOLOGY: No anemia, easy bruising or bleeding SKIN: No rash or lesion. MUSCULOSKELETAL: No joint pain or arthritis.   NEUROLOGIC: No tingling, numbness, weakness.  PSYCHIATRY: No anxiety or depression.   ROS  DRUG ALLERGIES:   Allergies  Allergen Reactions  . Codeine Nausea And Vomiting  . Meloxicam Other (See Comments)    Stomach pain    VITALS:  Blood pressure 111/62, pulse 83, temperature 99.4 F (37.4 C), temperature source Oral, resp. rate 20, height _0  (1.6 m), weight 106.4 kg (234 lb 9.6 oz), SpO2 94 %.  PHYSICAL EXAMINATION:   GENERAL:  62 y.o.-year-old patient lying in the bed with no acute distress.  EYES: Pupils equal, round, reactive to light and accommodation. No scleral icterus. Extraocular muscles intact.  HEENT: Head atraumatic, normocephalic. Oropharynx and nasopharynx clear.  NECK:  Supple, no jugular venous distention. No thyroid enlargement, no tenderness.  LUNGS: Decreased breath sounds  bilaterally, bilateral wheezing, rale. No use of accessory muscles of respiration.  CARDIOVASCULAR: S1, S2 normal. No murmurs, rubs, or gallops.  ABDOMEN: Soft, nontender, nondistended. Bowel sounds present. No organomegaly or mass.  EXTREMITIES: No pedal edema, cyanosis, or clubbing.  NEUROLOGIC: Cranial nerves II through XII are intact. Muscle strength 5/5 in all extremities. Sensation intact. Gait not checked.  PSYCHIATRIC: The patient is alert and oriented x 3.  SKIN: No obvious rash, lesion, or ulcer.     Physical Exam LABORATORY PANEL:   CBC Recent Labs  Lab 02/08/18 0643  WBC 5.1  HGB 14.5  HCT 43.8  PLT 146*   ------------------------------------------------------------------------------------------------------------------  Chemistries  Recent Labs  Lab 02/08/18 0643  NA 141  K 4.1  CL 106  CO2 27  GLUCOSE 158*  BUN 16  CREATININE 0.59  CALCIUM 9.1   ------------------------------------------------------------------------------------------------------------------  Cardiac Enzymes Recent Labs  Lab 02/07/18 1421  TROPONINI <0.03   ------------------------------------------------------------------------------------------------------------------  RADIOLOGY:  Dg Chest 2 View  Result Date: 02/07/2018 CLINICAL DATA:  Increasing shortness of breath over the past couple of days. EXAM: CHEST - 2 VIEW COMPARISON:  CT chest 02/01/2018. Single-view of the chest 12/11/2017 and 02/15/2017. FINDINGS: The lungs are emphysematous but clear. Heart size is normal. Aortic atherosclerosis is noted. No pneumothorax or pleural effusion. No acute bony abnormality. IMPRESSION: Emphysema without acute disease. Atherosclerosis. Electronically Signed   By: Inge Rise M.D.   On: 02/07/2018 16:57    ASSESSMENT AND PLAN:   Active Problems:   COPD (chronic obstructive pulmonary disease) ([place])  62 year old female patient with history of congestive heart failure,  emphysema, on  home oxygen, hypertension, anxiety disorder presented to the emergency room with shortness of breath and wheezing.  * Acute COPD exacerbation Nebulization treatments Oxygen via nasal cannula Start patient on IV Solu-Medrol Oral doxycycline antibiotic  * Acute on chronic respiratory failure with hypoxia Continue oxygen via nasal cannula, bipap at night  *Anxiety disorder Anxiolytic medication  *Chronic congestive heart failure Continue Lasix for diuresis  *Hypokalemia Replace potassium  *DVT prophylaxis with subcu Lovenox daily  *Tobacco cessation consult for 4 minutes Nicotine patch offered     All the records are reviewed and case discussed with Care Management/Social Workerr. Management plans discussed with the patient, family and they are in agreement.  CODE STATUS: Full.  TOTAL TIME TAKING CARE OF THIS PATIENT: 35 minutes.     POSSIBLE D/C IN 1-2 DAYS, DEPENDING ON CLINICAL CONDITION.   Vaughan Basta M.D on 02/08/2018   Between 7am to 6pm - Pager - [phone]  After 6pm go to [email or web address] - password [email or web address] [facility]  Office  [phone]  CC: Primary care physician; Margo Common, PA  Note: This dictation was prepared with Dragon dictation along with smaller phrase technology. Any transcriptional errors that result from this process are unintentional.

## 2018-02-09 MED ORDER — METHYLPREDNISOLONE SODIUM SUCC 125 MG IJ SOLR
60.0000 mg | Freq: Every day | INTRAMUSCULAR | Status: DC
  Administered 2018-02-10: 60 mg via INTRAVENOUS

## 2018-02-09 MED ORDER — GUAIFENESIN 100 MG/5ML PO SOLN
5.0000 mL | ORAL | Status: DC | PRN
  Administered 2018-02-10: 100 mg via ORAL
  Filled 2018-02-09 (×2): qty 5

## 2018-02-09 NOTE — Care Management Important Message (Signed)
Copy of signed IM left in patient's room.

## 2018-02-09 NOTE — Progress Notes (Signed)
[place] at [place] NAME: Theresa [name]    MR#:  [id number]  DATE OF BIRTH:  [birth year]  SUBJECTIVE:   Patient came in after having shortness of breath when she was out early morning hours for Easter Sunday service. She started getting chest tightness and shortness of breath. She feels improving slowly.Marland Kitchen REVIEW OF SYSTEMS:   Review of Systems  Constitutional: Negative for chills, fever and weight loss.  HENT: Negative for ear discharge, ear pain and nosebleeds.   Eyes: Negative for blurred vision, pain and discharge.  Respiratory: Positive for shortness of breath. Negative for sputum production, wheezing and stridor.   Cardiovascular: Negative for chest pain, palpitations, orthopnea and PND.  Gastrointestinal: Negative for abdominal pain, diarrhea, nausea and vomiting.  Genitourinary: Negative for frequency and urgency.  Musculoskeletal: Negative for back pain and joint pain.  Neurological: Negative for sensory change, speech change, focal weakness and weakness.  Psychiatric/Behavioral: Negative for depression and hallucinations. The patient is not nervous/anxious.    Tolerating Diet:yes Tolerating PT: not needed  DRUG ALLERGIES:   Allergies  Allergen Reactions  . Codeine Nausea And Vomiting  . Meloxicam Other (See Comments)    Stomach pain    VITALS:  Blood pressure 114/68, pulse (!) 59, temperature 97.8 F (36.6 C), temperature source Oral, resp. rate 20, height _0  (1.6 m), weight 106.4 kg (234 lb 9.6 oz), SpO2 95 %.  PHYSICAL EXAMINATION:   Physical Exam  GENERAL:  62 y.o.-year-old patient lying in the bed with no acute distress. Obese,severe EYES: Pupils equal, round, reactive to light and accommodation. No scleral icterus. Extraocular muscles intact.  HEENT: Head atraumatic, normocephalic. Oropharynx and nasopharynx clear.  NECK:  Supple, no jugular venous distention. No thyroid enlargement, no tenderness.   LUNGS: distant breath sounds bilaterally, no wheezing, rales, rhonchi. No use of accessory muscles of respiration.  CARDIOVASCULAR: S1, S2 normal. No murmurs, rubs, or gallops.  ABDOMEN: Soft, nontender, nondistended. Bowel sounds present. No organomegaly or mass.  EXTREMITIES: No cyanosis, clubbing or edema b/l.    NEUROLOGIC: Cranial nerves II through XII are intact. No focal Motor or sensory deficits b/l.   PSYCHIATRIC:  patient is alert and oriented x 3.  SKIN: No obvious rash, lesion, or ulcer.   LABORATORY PANEL:  CBC Recent Labs  Lab 02/08/18 0643  WBC 5.1  HGB 14.5  HCT 43.8  PLT 146*    Chemistries  Recent Labs  Lab 02/08/18 0643  NA 141  K 4.1  CL 106  CO2 27  GLUCOSE 158*  BUN 16  CREATININE 0.59  CALCIUM 9.1   Cardiac Enzymes Recent Labs  Lab 02/07/18 1421  TROPONINI <0.03   RADIOLOGY:  Dg Chest 2 View  Result Date: 02/07/2018 CLINICAL DATA:  Increasing shortness of breath over the past couple of days. EXAM: CHEST - 2 VIEW COMPARISON:  CT chest 02/01/2018. Single-view of the chest 12/11/2017 and 02/15/2017. FINDINGS: The lungs are emphysematous but clear. Heart size is normal. Aortic atherosclerosis is noted. No pneumothorax or pleural effusion. No acute bony abnormality. IMPRESSION: Emphysema without acute disease. Atherosclerosis. Electronically Signed   By: Inge Rise M.D.   On: 02/07/2018 16:57   ASSESSMENT AND PLAN:  62 year old female patient with history of congestive heart failure, emphysema, on home oxygen, hypertension, anxiety disorder presented to the emergency room with shortness of breath and wheezing.  * Acute on chronic COPD exacerbation after exposure to cold air in the early hours of Easter  Sunday service and pollen Nebulization treatments Oxygen via nasal cannula patient on IV Solu-Medrol--taper slowly cxr no PNA... No indication for antibiotic  * Acute on chronic respiratory failure with hypoxia Continue oxygen via nasal  cannula, bipap at night  *Anxiety disorder Anxiolytic medication  *Chronic congestive heart failure-diastolic per echo Continue Lasix for diuresis  *Hypokalemia Replace potassium  *DVT prophylaxis with subcu Lovenox daily  *Tobacco cessation consult for 4 minutes Nicotine patch offered   Continues to improve discharged home tomorrow. Patient in agreement with plan  Case discussed with Care Management/Social Worker. Management plans discussed with the patient, family and they are in agreement.  CODE STATUS: full  DVT Prophylaxis: lovenox  TOTAL TIME TAKING CARE OF THIS PATIENT: *30* minutes.  >50% time spent on counselling and coordination of care  POSSIBLE D/C IN 1 DAYS, DEPENDING ON CLINICAL CONDITION.  Note: This dictation was prepared with Dragon dictation along with smaller phrase technology. Any transcriptional errors that result from this process are unintentional.  Fritzi Mandes M.D on 02/09/2018 at 9:17 AM  Between 7am to 6pm - Pager - [phone]  After 6pm go to [email or web address] - password [email or web address] [facility]  Office  [phone]  CC: Primary care physician; Margo Common, PAPatient ID: Theresa [name], female   DOB: November 28, 1955, 62 y.o.   MRN: [id number]

## 2018-02-09 NOTE — Care Management (Signed)
Chronic home oxygen.  Has had home health referral for nursing in the past but has not allowed agency to visit.  [facility] referral made

## 2018-02-10 MED ORDER — PREDNISONE 50 MG PO TABS: 50.0000 mg | ORAL_TABLET | Freq: Every day | ORAL | Status: DC

## 2018-02-10 MED ORDER — PREDNISONE 10 MG PO TABS: ORAL_TABLET | ORAL | 0 refills | Status: DC

## 2018-02-10 MED ORDER — VENTOLIN HFA 108 (90 BASE) MCG/ACT IN AERS: 2.0000 | INHALATION_SPRAY | Freq: Four times a day (QID) | RESPIRATORY_TRACT | 5 refills | Status: DC | PRN

## 2018-02-10 NOTE — Progress Notes (Signed)
Pt to be discharged this afternoon. Iv removed.(pt had no tele). disch instructions given to pt to her understanding. disch via w.c. Accompanied by husband.

## 2018-02-12 ENCOUNTER — Other Ambulatory Visit: Payer: Self-pay | Admitting: *Deleted

## 2018-02-12 NOTE — Patient Outreach (Signed)
[place] [facility]) Care Management  02/12/2018  Theresa [name] August 22, 1956 [id number]   Unsuccessful telephone encounter to [name], 62 year old female- follow up on  Referral received 02/12/18 from [facility] for [place] [facility]  Management services, follow up on recent hospitalization April 24-27,2019 for COPD Exacerbation.  Pt's history includes but not limited to Degenerative Disc disease, Chronic diastolic heart disease, pulmonary hypertensive arterial disease, pain  Management.   PCP on list for doing transition of care calls.   HIPAA compliant voice message left with RN CM's contact information, request to  Return call.    Plan:  If no response, unsuccessful outreach letter to be sent to pt and if no  Response in 10 business days, to close case and notify PCP of referral.  RN CM  To make another outreach call in two business days.   [name].   [facility]  [phone]

## 2018-02-13 ENCOUNTER — Other Ambulatory Visit: Payer: Self-pay | Admitting: *Deleted

## 2018-02-13 NOTE — Patient Outreach (Signed)
[facility]) Care Management  02/13/2018  Vilma Will 23-Dec-1955 [id number]   Unsuccessful telephone encounter to [name], 62 year old female-  follow up on in basket received today  From [place] concerning [place] discharge call made to pt yesterday, call resulted in  3 Red Flags: who reached =pt   1. Got Discharge papers- pt answered no  2. Know who to call about changes in condition- pt answered no  3. Scheduled follow up - pt answered no.  HIPAA compliant voice message left with RN CM's contact information, request to return call.   This RN CM received a referral 02/12/18 from [place] CM for [place]  Management services, follow up on recent hospitalization April 24-27,2019 for COPD exacerbation.   First unsuccessful telephone encounter made yesterday, HIPAA compliant voice message left,  Unsuccessful outreach letter sent to pt.    Plan:  RN CM to follow up again with pt telephonically in 3 days.    [name].   [place] [facility] Management  [phone]

## 2018-02-14 ENCOUNTER — Ambulatory Visit: Payer: Self-pay | Admitting: *Deleted

## 2018-02-16 ENCOUNTER — Other Ambulatory Visit: Payer: Self-pay | Admitting: Family

## 2018-02-16 ENCOUNTER — Other Ambulatory Visit: Payer: Self-pay | Admitting: *Deleted

## 2018-02-16 MED ORDER — FUROSEMIDE 40 MG PO TABS: 40.0000 mg | ORAL_TABLET | Freq: Two times a day (BID) | ORAL | 3 refills | Status: DC

## 2018-02-16 NOTE — Patient Outreach (Signed)
[facility]) Care Management  02/16/2018  Theresa [name] 04/05/1956 [id number]   Care coordination call. PCP office on list for doing Transition of care calls.   Successful telephone encounter to Theresa [name], 62 year old female, follow up on referral from [place] inpatient RN CM for [place]  Management services/recent hospitalization April 24-27,2019 for COPD exacerbation.   Pt also following up on [facility] discharge call made 4/29 with  Pt (3 red flags) pt answered no to having discharge papers, no to know who  To call about changes in condition, no to scheduled follow up as well as  [facility] discharge call made yesterday (one red flag)- pt answered yes to  Lost interest in things.  Spoke with pt, HIPAA identifiers verified, discussed  Purpose of call- follow up on referral as well as the 2 [facility] calls.  RN CM  Discussed [facility] Care management services, benefit of her insurance,work  With PCP to which pt gave verbal consent to services.  Pt reports she  Remembers both [facility] calls, first call- does have discharge papers just  Could not understand why she had HF information on the papers when she  Was in for COPD, knows who to call if condition changes, needs to make  F/u appointment with PCP; second [facility] call- lost interest in things result of  Having a bad year, lost her daughter a year ago/effects her health/has anti  Anxiety medication as needed- helps.    Pt reports called HF clinic MD today because of sob walking short distances,  Sweating to which was told to call her Heart MD.  Pt reports had the  Sweating before recent hospitalization, on  continuous 2.5 Liters [place], taking all of her medications as ordered.  Medication review completed with pt.   Pt reports to follow up with Pain MD next week, Lung MD next month, to call  PCP and schedule follow up appointment.  Discussed with pt doing a home  Visit next week to which agreed.   Provided pt with RN CM's contact  information, [facility] main office number to call if needed as well as 24  Hour nurse advice line.    Plan: As discussed with pt, plan to follow up again next week- initial home  Visit.           Plan to inform pt's PCP Theresa Murders PA of [facility] involvement, send  Psychologist, forensic.   Theresa [name].   [place] Care Management  [phone]

## 2018-02-19 ENCOUNTER — Ambulatory Visit: Payer: Medicare HMO | Attending: Pain Medicine | Admitting: Pain Medicine

## 2018-02-19 ENCOUNTER — Encounter: Payer: Self-pay | Admitting: Pain Medicine

## 2018-02-19 VITALS — BP 108/75 | HR 95 | Temp 98.7°F | Resp 16 | Ht 62.0 in | Wt 232.0 lb

## 2018-02-19 DIAGNOSIS — M48062 Spinal stenosis, lumbar region with neurogenic claudication: Secondary | ICD-10-CM | POA: Diagnosis not present

## 2018-02-19 DIAGNOSIS — Z888 Allergy status to other drugs, medicaments and biological substances status: Secondary | ICD-10-CM | POA: Diagnosis not present

## 2018-02-19 DIAGNOSIS — M5116 Intervertebral disc disorders with radiculopathy, lumbar region: Secondary | ICD-10-CM | POA: Diagnosis not present

## 2018-02-19 DIAGNOSIS — M545 Low back pain, unspecified: Secondary | ICD-10-CM

## 2018-02-19 DIAGNOSIS — Z9981 Dependence on supplemental oxygen: Secondary | ICD-10-CM | POA: Diagnosis not present

## 2018-02-19 DIAGNOSIS — F17210 Nicotine dependence, cigarettes, uncomplicated: Secondary | ICD-10-CM | POA: Diagnosis not present

## 2018-02-19 DIAGNOSIS — M79605 Pain in left leg: Secondary | ICD-10-CM | POA: Diagnosis not present

## 2018-02-19 DIAGNOSIS — G5603 Carpal tunnel syndrome, bilateral upper limbs: Secondary | ICD-10-CM | POA: Diagnosis not present

## 2018-02-19 DIAGNOSIS — G4730 Sleep apnea, unspecified: Secondary | ICD-10-CM | POA: Diagnosis not present

## 2018-02-19 DIAGNOSIS — Z6841 Body Mass Index (BMI) 40.0 and over, adult: Secondary | ICD-10-CM | POA: Insufficient documentation

## 2018-02-19 DIAGNOSIS — Z79891 Long term (current) use of opiate analgesic: Secondary | ICD-10-CM | POA: Insufficient documentation

## 2018-02-19 DIAGNOSIS — M48061 Spinal stenosis, lumbar region without neurogenic claudication: Secondary | ICD-10-CM

## 2018-02-19 DIAGNOSIS — F329 Major depressive disorder, single episode, unspecified: Secondary | ICD-10-CM | POA: Insufficient documentation

## 2018-02-19 DIAGNOSIS — J449 Chronic obstructive pulmonary disease, unspecified: Secondary | ICD-10-CM | POA: Insufficient documentation

## 2018-02-19 DIAGNOSIS — M9983 Other biomechanical lesions of lumbar region: Secondary | ICD-10-CM

## 2018-02-19 DIAGNOSIS — G894 Chronic pain syndrome: Secondary | ICD-10-CM | POA: Diagnosis not present

## 2018-02-19 DIAGNOSIS — M79604 Pain in right leg: Secondary | ICD-10-CM | POA: Diagnosis not present

## 2018-02-19 DIAGNOSIS — Z885 Allergy status to narcotic agent status: Secondary | ICD-10-CM | POA: Diagnosis not present

## 2018-02-19 DIAGNOSIS — M5126 Other intervertebral disc displacement, lumbar region: Secondary | ICD-10-CM

## 2018-02-19 DIAGNOSIS — E559 Vitamin D deficiency, unspecified: Secondary | ICD-10-CM | POA: Insufficient documentation

## 2018-02-19 DIAGNOSIS — J9610 Chronic respiratory failure, unspecified whether with hypoxia or hypercapnia: Secondary | ICD-10-CM | POA: Diagnosis not present

## 2018-02-19 DIAGNOSIS — Z79899 Other long term (current) drug therapy: Secondary | ICD-10-CM | POA: Insufficient documentation

## 2018-02-19 DIAGNOSIS — M5416 Radiculopathy, lumbar region: Secondary | ICD-10-CM | POA: Diagnosis not present

## 2018-02-19 DIAGNOSIS — G8929 Other chronic pain: Secondary | ICD-10-CM | POA: Diagnosis not present

## 2018-02-19 NOTE — Progress Notes (Signed)
Safety precautions to be maintained throughout the outpatient stay will include: orient to surroundings, keep bed in low position, maintain call bell within reach at all times, provide assistance with transfer out of bed and ambulation.  

## 2018-02-19 NOTE — Progress Notes (Signed)
Patient's Name: Theresa [name]  MRN: [id number]  Referring Provider: Margo Common, [place]  DOB: [birth year]  PCP: Margo Common, PA  DOS: 02/19/2018  Note by: Gaspar Cola, MD  Service setting: Ambulatory outpatient  Specialty: Interventional Pain Management  Location: [facility] (AMB) Pain Management Facility    Patient type: Established   Primary Reason(s) for Visit: Encounter for post-procedure evaluation of chronic illness with mild to moderate exacerbation CC: Back Pain (lower bilateral )  HPI  [name] is a 62 y.o. year old, female patient, who comes today for a post-procedure evaluation. She has Neuritis or radiculitis due to rupture of lumbar intervertebral disc; Decreased motor strength; Essential (primary) hypertension; Clinical depression; DDD (degenerative disc disease), lumbar; CAFL (chronic airflow limitation) ([place]); Chronic rhinitis; Carpal tunnel syndrome; Blood clotting disorder ([employer]); Anxiety state; Chronic diastolic heart failure ([place]); Chest pain; Tobacco abuse; Encounter for therapeutic drug level monitoring; Encounter for chronic pain management; Pain management; Neurogenic pain; Neuropathic pain; Musculoskeletal pain; Lumbar spondylosis (L4-5); Chronic low back pain (Primary Area of Pain) (Left); Chronic lower extremity pain (Secondary Area of Pain) (Left); Chronic lumbar radicular pain (L4 & S1 Dermatome) (Left); Chronic shoulder pain (Right side); Chronic upper extremity pain (Right-sided); Cervical radiculitis (Right side); Abnormal x-ray of lumbar spine; Chronic pain syndrome (significant psychosocial component); 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; Coagulation disorder ([place]); 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); Obesity, Class III, BMI 40-49.9 (morbid obesity) ([place]); 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[place]); Vitamin D deficiency; Long term current use of opiate analgesic; Long term prescription opiate use; Opiate use (15 MME/Day);  Lumbar facet syndrome (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); Chronic sacroiliac joint pain (Bilateral); Sleep apnea; Supplemental oxygen dependent; Lumbar facet hypertrophy (multilevel); Lumbar spinal stenosis (L4-5); 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); 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); Lumbosacral radiculopathy at L4; Grief at loss of child; Chronic shoulder pain (Left); Elevated brain natriuretic peptide (BNP) level; Arthralgia of acromioclavicular joint (Right); Acromioclavicular joint DJD (Right); Osteoarthritis of shoulder (Right); COPD with hypoxia ([place]); Other long term (current) drug therapy; Disorder of bone, unspecified; COPD (chronic obstructive pulmonary disease) ([place]); Chronic hip pain (Tertiary Area of Pain) (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); Altered mental status, unspecified; Acute respiratory failure ([place]); and Chronic lower extremity pain (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) on their problem list. Her primarily concern today is the Back Pain (lower bilateral )  Pain Assessment: Location: Lower, Left, Right Back Radiating: sometimes into the legs currently radiating into L leg down to the toes.  Onset: More than a month ago Duration: Chronic pain Quality: Discomfort(feels like someone is going to cut her in two ) Severity: 5 /10 (subjective, self-reported pain score)  Note: Reported level is compatible with observation.                         When using our objective Pain Scale, levels between 6 and 10/10 are said to belong in an emergency room, as it progressively worsens from a 6/10, described as severely limiting, requiring emergency care not usually available at an outpatient pain management facility. At a 6/10 level, communication becomes difficult and requires great effort. Assistance to reach the emergency department may be required. Facial flushing and profuse sweating along with potentially dangerous increases in heart rate and blood pressure will be evident. Effect on ADL: unable to walk or stand  for very long periods of time. "more than 5 minutes"  if she doesn't feels as if knees are going to buckle.  Timing: Constant Modifying factors:  nothing  BP: 108/75  HR: 95  [name] comes in today for post-procedure evaluation after the treatment done on 01/25/2018.  Further details on both, my assessment(s), as well as the proposed treatment plan, please see below.  Post-Procedure Assessment  01/25/2018 Procedure: Diagnostic/therapeutic right L4-5 interlaminar LESI + right L4 transforaminal LESI #2   Pre-procedure pain score:  5/10 Post-procedure pain score: 0/10 (100% relief) Influential Factors: BMI: 42.43 kg/m Intra-procedural challenges: None observed.         Assessment challenges: None detected.              Reported side-effects: None.        Post-procedural adverse reactions or complications: None reported         Sedation: Sedation provided. When no sedatives are used, the analgesic levels obtained are directly associated to the effectiveness of the local anesthetics. However, when sedation is provided, the level of analgesia obtained during the initial 1 hour following the intervention, is believed to be the result of a combination of factors. These factors may include, but are not limited to: 1. The effectiveness of the local anesthetics used. 2. The effects of the analgesic(s) and/or anxiolytic(s) used. 3. The degree of discomfort experienced by the patient at the time of the procedure. 4. The patients ability and reliability in recalling and recording the events. 5. The presence and influence of possible secondary gains and/or psychosocial factors. Reported result: Relief experienced during the 1st hour after the procedure: 100 % (Ultra-Short Term Relief)            Interpretative annotation: Clinically appropriate result. Analgesia during this period is likely to be Local Anesthetic and/or IV Sedative (Analgesic/Anxiolytic) related.          Effects of local  anesthetic: The analgesic effects attained during this period are directly associated to the localized infiltration of local anesthetics and therefore cary significant diagnostic value as to the etiological location, or anatomical origin, of the pain. Expected duration of relief is directly dependent on the pharmacodynamics of the local anesthetic used. Long-acting (4-6 hours) anesthetics used.  Reported result: Relief during the next 4 to 6 hour after the procedure: 100 % (Short-Term Relief)            Interpretative annotation: Clinically appropriate result. Analgesia during this period is likely to be Local Anesthetic-related.          Long-term benefit: Defined as the period of time past the expected duration of local anesthetics (1 hour for short-acting and 4-6 hours for long-acting). With the possible exception of prolonged sympathetic blockade from the local anesthetics, benefits during this period are typically attributed to, or associated with, other factors such as analgesic sensory neuropraxia, antiinflammatory effects, or beneficial biochemical changes provided by agents other than the local anesthetics.  Reported result: Extended relief following procedure: 50 % (Long-Term Relief)            Interpretative annotation: Clinically appropriate result. Good relief. No permanent benefit expected. Inflammation plays a part in the etiology to the pain.          Current benefits: Defined as reported results that persistent at this point in time.   Analgesia: 50 %            Function: Somewhat improved ROM: Somewhat improved Interpretative annotation: Recurrence of symptoms. No permanent benefit expected. Effective diagnostic intervention.          Interpretation: Results would suggest a successful diagnostic intervention.  Plan:  Please see "Plan of Care" for details.                Laboratory Chemistry  Inflammation Markers (CRP: Acute Phase) (ESR: Chronic Phase) Lab  Results  Component Value Date   CRP 9.1 (H) 08/08/2017   ESRSEDRATE 16 08/08/2017   LATICACIDVEN 0.9 12/10/2017                         Renal Function Markers Lab Results  Component Value Date   BUN 16 02/08/2018   CREATININE 0.59 02/08/2018   GFRAA >60 02/08/2018   GFRNONAA >60 02/08/2018                              Hepatic Function Markers Lab Results  Component Value Date   AST 24 12/09/2017   ALT 21 12/09/2017   ALBUMIN 3.8 12/09/2017   ALKPHOS 87 12/09/2017   LIPASE 31 10/17/2017   AMMONIA 31 12/09/2017                        Electrolytes Lab Results  Component Value Date   NA 141 02/08/2018   K 4.1 02/08/2018   CL 106 02/08/2018   CALCIUM 9.1 02/08/2018   MG 2.5 (H) 12/10/2017   PHOS 5.4 (H) 12/10/2017                        Neuropathy Markers Lab Results  Component Value Date   VITAMINB12 501 08/08/2017   HIV Non Reactive 12/10/2017                        Bone Pathology Markers Lab Results  Component Value Date   25OHVITD1 13 (L) 09/17/2015   25OHVITD2 <1.0 09/17/2015   25OHVITD3 13 09/17/2015                         Coagulation Parameters Lab Results  Component Value Date   INR 1.04 12/09/2017   LABPROT 13.6 12/09/2017   APTT 36 12/09/2017   PLT 146 (L) 02/08/2018                        Cardiovascular Markers Lab Results  Component Value Date   BNP 333.4 (H) 12/10/2017   CKTOTAL 72 12/27/2012   CKMB 1.0 12/27/2012   TROPONINI <0.03 02/07/2018   HGB 14.5 02/08/2018   HCT 43.8 02/08/2018                         Note: Lab results reviewed.  Recent Diagnostic Imaging Results  DG Chest 2 View CLINICAL DATA:  Increasing shortness of breath over the past couple of days.  EXAM: CHEST - 2 VIEW  COMPARISON:  CT chest 02/01/2018. Single-view of the chest 12/11/2017 and 02/15/2017.  FINDINGS: The lungs are emphysematous but clear. Heart size is normal. Aortic atherosclerosis is noted. No pneumothorax or pleural effusion. No acute  bony abnormality.  IMPRESSION: Emphysema without acute disease.  Atherosclerosis.  Electronically Signed   By: Inge Rise M.D.   On: 02/07/2018 16:57  Complexity Note: I personally reviewed the fluoroscopic imaging of the procedure.  Meds   Current Outpatient Medications:  .  ADVAIR DISKUS 250-50 MCG/DOSE AEPB, Inhale 1 puff into the lungs 2 (two) times daily., Disp: , Rfl: 3 .  ALPRAZolam (XANAX) 0.5 MG tablet, Take 1 tablet (0.5 mg total) by mouth 2 (two) times daily as needed for anxiety., Disp: 60 tablet, Rfl: 0 .  bisoprolol-hydrochlorothiazide (ZIAC) 10-6.25 MG tablet, Take 1 tablet by mouth daily., Disp: , Rfl:  .  Cholecalciferol ([employer] D3) 2000 units CAPS, Take 1 capsule by mouth daily., Disp: , Rfl:  .  DALIRESP 500 MCG TABS tablet, Take 500 mcg by mouth daily., Disp: , Rfl:  .  Fluticasone-Salmeterol (ADVAIR DISKUS) 250-50 MCG/DOSE AEPB, INHALE 1 PUFF INTO LUNGS TWICE A DAY, Disp: , Rfl:  .  furosemide (LASIX) 40 MG tablet, Take 1 tablet (40 mg total) by mouth 2 (two) times daily., Disp: 180 tablet, Rfl: 3 .  gabapentin (NEURONTIN) 100 MG capsule, Take 1-3 capsules (100-300 mg total) by mouth at bedtime. Follow written titration schedule., Disp: 90 capsule, Rfl: 2 .  [START ON 04/21/2018] HYDROcodone-acetaminophen (NORCO/VICODIN) 5-325 MG tablet, Take 1 tablet by mouth every 8 (eight) hours as needed for severe pain., Disp: 90 tablet, Rfl: 0 .  ipratropium-albuterol (DUONEB) 0.5-2.5 (3) MG/3ML SOLN, Inhale 3 mLs into the lungs every 6 (six) hours as needed for shortness of breath., Disp: , Rfl: 3 .  OXYGEN, Inhale 2.5 L into the lungs continuous. Reported on 11/03/2015, Disp: , Rfl:  .  potassium chloride SA (K-DUR,KLOR-CON) 20 MEQ tablet, Take 20 mEq by mouth 2 (two) times daily., Disp: , Rfl:  .  predniSONE (DELTASONE) 10 MG tablet, Take 50 mg daily. Taper by 10 mg daily then stop, Disp: 15 tablet, Rfl: 0 .  ranitidine (ZANTAC) 150 MG tablet, Take 150  mg by mouth 2 (two) times daily., Disp: , Rfl:  .  sertraline (ZOLOFT) 100 MG tablet, Take 2 tablets (200 mg total) by mouth every morning., Disp: 60 tablet, Rfl: 3 .  VENTOLIN HFA 108 (90 Base) MCG/ACT inhaler, Inhale 2 puffs into the lungs every 6 (six) hours as needed for shortness of breath., Disp: 1 Inhaler, Rfl: 5  ROS  Constitutional: Denies any fever or chills Gastrointestinal: No reported hemesis, hematochezia, vomiting, or acute GI distress Musculoskeletal: Denies any acute onset joint swelling, redness, loss of ROM, or weakness Neurological: No reported episodes of acute onset apraxia, aphasia, dysarthria, agnosia, amnesia, paralysis, loss of coordination, or loss of consciousness  Allergies  Ms. Carbon is allergic to codeine and meloxicam.  PFSH  Drug: Ms. Allaire  reports that she does not use drugs. Alcohol:  reports that she does not drink alcohol. Tobacco:  reports that she has been smoking cigarettes.  She has a 44.00 pack-year smoking history. She has never used smokeless tobacco. Medical:  has a past medical history of Acute postoperative pain (10/03/2017), Anxiety, Carpal tunnel syndrome, CHF (congestive heart failure) ([place]), CHF (congestive heart failure) ([place]), Chronic generalized abdominal pain, Chronic rhinitis, COPD (chronic obstructive pulmonary disease) ([place]), DDD (degenerative disc disease), cervical, Depression, Edema, Flu, Gastritis, Hematuria, Hypertension, Kidney stones, Low back pain, Lumbar radiculitis, Malodorous urine, Muscle weakness, Obesity, Renal cyst, Sensory urge incontinence, and Tobacco abuse. Surgical: Ms. Santy  has a past surgical history that includes Abdominal hysterectomy; Tonsillectomy; Carpal tunnel release (Left); and Tubal ligation. Family: family history includes Alcohol abuse in her father; Coronary artery disease in her mother; Heart disease in her father and mother; Lung cancer in her sister; Stroke in her mother.  Constitutional Exam   General appearance: Well nourished, well developed, and well hydrated. In no apparent acute distress Vitals:   02/19/18 1356  BP: 108/75  Pulse: 95  Resp: 16  Temp: 98.7 F (37.1 C)  TempSrc: Oral  SpO2: 93%  Weight: 232 lb (105.2 kg)  Height: _0  (1.575 m)   BMI Assessment: Estimated body mass index is 42.43 kg/m as calculated from the following:   Height as of this encounter: _1  (1.575 m).   Weight as of this encounter: 232 lb (105.2 kg).  BMI interpretation table: BMI level Category Range association with higher incidence of chronic pain  <18 kg/m2 Underweight   18.5-24.9 kg/m2 Ideal body weight   25-29.9 kg/m2 Overweight Increased incidence by 20%  30-34.9 kg/m2 Obese (Class I) Increased incidence by 68%  35-39.9 kg/m2 Severe obesity (Class II) Increased incidence by 136%  >40 kg/m2 Extreme obesity (Class III) Increased incidence by 254%   Patient's current BMI Ideal Body weight  Body mass index is 42.43 kg/m. Ideal body weight: 50.1 kg (110 lb 7.2 oz) Adjusted ideal body weight: 72.2 kg (159 lb 1.1 oz)   BMI Readings from Last 4 Encounters:  02/19/18 42.43 kg/m  02/10/18 40.94 kg/m  02/01/18 42.48 kg/m  01/31/18 43.16 kg/m   Wt Readings from Last 4 Encounters:  02/19/18 232 lb (105.2 kg)  02/10/18 231 lb 1.6 oz (104.8 kg)  02/01/18 236 lb (107 kg)  01/31/18 236 lb (107 kg)  Psych/Mental status: Alert, oriented x 3 (person, place, & time)       Eyes: PERLA Respiratory: No evidence of acute respiratory distress  Cervical Spine Area Exam  Skin & Axial Inspection: No masses, redness, edema, swelling, or associated skin lesions Alignment: Symmetrical Functional ROM: Unrestricted ROM      Stability: No instability detected Muscle Tone/Strength: Functionally intact. No obvious neuro-muscular anomalies detected. Sensory (Neurological): Unimpaired Palpation: No palpable anomalies              Upper Extremity (UE) Exam    Side: Right upper extremity   Side: Left upper extremity  Skin & Extremity Inspection: Skin color, temperature, and hair growth are WNL. No peripheral edema or cyanosis. No masses, redness, swelling, asymmetry, or associated skin lesions. No contractures.  Skin & Extremity Inspection: Skin color, temperature, and hair growth are WNL. No peripheral edema or cyanosis. No masses, redness, swelling, asymmetry, or associated skin lesions. No contractures.  Functional ROM: Unrestricted ROM          Functional ROM: Unrestricted ROM          Muscle Tone/Strength: Functionally intact. No obvious neuro-muscular anomalies detected.  Muscle Tone/Strength: Functionally intact. No obvious neuro-muscular anomalies detected.  Sensory (Neurological): Unimpaired          Sensory (Neurological): Unimpaired          Palpation: No palpable anomalies              Palpation: No palpable anomalies              Specialized Test(s): Deferred         Specialized Test(s): Deferred          Thoracic Spine Area Exam  Skin & Axial Inspection: No masses, redness, or swelling Alignment: Symmetrical Functional ROM: Unrestricted ROM Stability: No instability detected Muscle Tone/Strength: Functionally intact. No obvious neuro-muscular anomalies detected. Sensory (Neurological): Unimpaired Muscle strength & Tone: No palpable anomalies  Lumbar Spine Area Exam  Skin & Axial Inspection: No masses,  redness, or swelling Alignment: Symmetrical Functional ROM: Improved after treatment       Stability: No instability detected Muscle Tone/Strength: Functionally intact. No obvious neuro-muscular anomalies detected. Sensory (Neurological): Unimpaired Palpation: No palpable anomalies       Provocative Tests: Lumbar Hyperextension and rotation test: Improved after treatment bilaterally for facet joint pain. Lumbar Lateral bending test: evaluation deferred today       Patrick's Maneuver: evaluation deferred today                    Gait & Posture Assessment   Ambulation: Patient came in today in a wheel chair Gait: Limited. Using assistive device to ambulate Posture: Antalgic   Lower Extremity Exam    Side: Right lower extremity  Side: Left lower extremity  Stability: No instability observed          Stability: No instability observed          Skin & Extremity Inspection: Skin color, temperature, and hair growth are WNL. No peripheral edema or cyanosis. No masses, redness, swelling, asymmetry, or associated skin lesions. No contractures.  Skin & Extremity Inspection: Skin color, temperature, and hair growth are WNL. No peripheral edema or cyanosis. No masses, redness, swelling, asymmetry, or associated skin lesions. No contractures.  Functional ROM: Unrestricted ROM                  Functional ROM: Unrestricted ROM                  Muscle Tone/Strength: Functionally intact. No obvious neuro-muscular anomalies detected.  Muscle Tone/Strength: Functionally intact. No obvious neuro-muscular anomalies detected.  Sensory (Neurological): Unimpaired  Sensory (Neurological): Unimpaired  Palpation: No palpable anomalies  Palpation: No palpable anomalies   Assessment  Primary Diagnosis & Pertinent Problem List: The primary encounter diagnosis was Chronic low back pain (Primary Area of Pain) (Left). 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  Status Diagnosis  Controlled Controlled Controlled 1. Chronic low back pain (Primary Area of Pain) (Left)   2. Chronic lower extremity pain (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)   3. Chronic lower extremity pain (Secondary Area of Pain) (Left)   4. Chronic lumbar radicular pain (L4 & S1 Dermatome)  (Left)   5. 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 Spinal stenosis of lumbar region with neurogenic claudication     Problems updated and reviewed during this visit: No problems updated. Plan of Care  Pharmacotherapy (Medications Ordered): No orders of the defined types were placed in this encounter.  Medications administered today: [name] had no medications administered during this visit.  Procedure Orders    No procedure(s) ordered today   Lab Orders  No laboratory test(s) ordered today   Imaging Orders  No imaging studies ordered today   Referral Orders  No referral(s) requested today    Interventional management options: Planned, scheduled, and/or pending:   Diagnostic/therapeutic right L4-5 interlaminar LESI + right L4 transforaminal LESI #3 (PRN)     Considering:   Diagnostic bilateral lumbar facet block Possible left lumbar facet RFA(right side done on 10/03/2017: Left side done on 06/27/2017) Therapeutic left L4-5 interlaminar LESI + left L4  TFESI    Palliative PRN treatment(s):   Palliative left sided lumbar facetblock Palliative left-sided L4-5 interlaminar LESI + left-sided L4 TFESI    Provider-requested follow-up: Return for Med-Mgmt, w/ [name], NP.  Future Appointments  Date Time Provider [place]  02/21/2018  2:15 PM Lyman Speller, RN [facility] None  03/14/2018  1:20 PM [name], FNP [facility] None  04/10/2018  3:00 PM Harlin Heys, MD [facility] None  05/02/2018  2:30 PM Vevelyn Francois, NP [facility] None  01/21/2019  2:20 PM [facility]-NURSE HEALTH ADVISOR [facility] None   Primary Care Physician: Margo Common, PA Location: [facility] Note by: Gaspar Cola, MD Date: 02/19/2018; Time: 2:20 PM

## 2018-02-21 ENCOUNTER — Other Ambulatory Visit: Payer: Self-pay | Admitting: *Deleted

## 2018-02-21 ENCOUNTER — Encounter: Payer: Self-pay | Admitting: *Deleted

## 2018-02-21 NOTE — Patient Outreach (Signed)
[facility]) Care Management   02/21/2018  Theresa [name] Mar 10, 1956 [id number]  Theresa [name] is an 62 y.o. female  Subjective:  Pt reports past couple days not been feeling good, using nebulizer treatments  4 times a day, rescue inhaler daily, taking all of her medications.  Pt reports continues on  2.5 liters [place], been sleeping on a wedge pillow now for 6 years, to see Lung MD next  Month.   Pt reports continues with chronic back pain, today radiating to right knee, follows  Up at pain clinic, rest helps a little.   Objective:   Vitals:   02/21/18 1439  BP: (!) 92/50  Pulse: 74  Resp: (!) 24  SpO2: 95%    ROS  Physical Exam  Constitutional: She is oriented to person, place, and time. She appears well-developed and well-nourished.  Cardiovascular: Normal rate, regular rhythm and normal heart sounds.  Respiratory: Effort normal.  Congestion noted in all lung fields on expiration.     GI: Soft.  Musculoskeletal: Normal range of motion. She exhibits no edema.  Neurological: She is alert and oriented to person, place, and time.  Skin: Skin is warm and dry.  Psychiatric: She has a normal mood and affect. Her behavior is normal. Judgment and thought content normal.    Encounter Medications:   Outpatient Encounter Medications as of 02/21/2018  Medication Sig Note  . ADVAIR DISKUS 250-50 MCG/DOSE AEPB Inhale 1 puff into the lungs 2 (two) times daily.   Marland Kitchen ALPRAZolam (XANAX) 0.5 MG tablet Take 1 tablet (0.5 mg total) by mouth 2 (two) times daily as needed for anxiety.   . bisoprolol-hydrochlorothiazide (ZIAC) 10-6.25 MG tablet Take 1 tablet by mouth daily.   . Cholecalciferol (CVS D3) 2000 units CAPS Take 1 capsule by mouth daily.   Marland Kitchen DALIRESP 500 MCG TABS tablet Take 500 mcg by mouth daily.   . Fluticasone-Salmeterol (ADVAIR DISKUS) 250-50 MCG/DOSE AEPB INHALE 1 PUFF INTO LUNGS TWICE A DAY   . furosemide (LASIX) 40 MG tablet Take 1 tablet (40 mg total) by mouth 2  (two) times daily.   Marland Kitchen gabapentin (NEURONTIN) 100 MG capsule Take 1-3 capsules (100-300 mg total) by mouth at bedtime. Follow written titration schedule.   Derrill Memo ON 04/21/2018] HYDROcodone-acetaminophen (NORCO/VICODIN) 5-325 MG tablet Take 1 tablet by mouth every 8 (eight) hours as needed for severe pain.   Marland Kitchen ipratropium-albuterol (DUONEB) 0.5-2.5 (3) MG/3ML SOLN Inhale 3 mLs into the lungs every 6 (six) hours as needed for shortness of breath.   . OXYGEN Inhale 2.5 L into the lungs continuous. Reported on 11/03/2015   . potassium chloride SA (K-DUR,KLOR-CON) 20 MEQ tablet Take 20 mEq by mouth 2 (two) times daily.   . predniSONE (DELTASONE) 10 MG tablet Take 50 mg daily. Taper by 10 mg daily then stop 02/16/2018: Completed   . ranitidine (ZANTAC) 150 MG tablet Take 150 mg by mouth 2 (two) times daily.   . sertraline (ZOLOFT) 100 MG tablet Take 2 tablets (200 mg total) by mouth every morning.   . VENTOLIN HFA 108 (90 Base) MCG/ACT inhaler Inhale 2 puffs into the lungs every 6 (six) hours as needed for shortness of breath.    No facility-administered encounter medications on file as of 02/21/2018.     Functional Status:   In your present state of health, do you have any difficulty performing the following activities: 02/21/2018 02/07/2018  Hearing? N N  Vision? Y N  Difficulty concentrating or making decisions? N  N  Walking or climbing stairs? Y Y  Comment - -  Dressing or bathing? N N  Doing errands, shopping? N N  Preparing Food and eating ? N -  Using the Toilet? N -  In the past six months, have you accidently leaked urine? Y -  Comment - -  Do you have problems with loss of bowel control? N -  Managing your Medications? N -  Managing your Finances? N -  Housekeeping or managing your Housekeeping? Y -  Some recent data might be hidden    Fall/Depression Screening:    Fall Risk  02/21/2018 02/19/2018 01/31/2018  Falls in the past year? Yes Yes No  Comment - - -  Number falls in past yr: 2  or more 1 -  Injury with Fall? No No No  Comment - - -  Risk Factor Category  - - -  Risk for fall due to : - - History of fall(s)  Follow up - - Falls evaluation completed   PHQ 2/9 Scores 02/21/2018 01/31/2018 01/25/2018 01/18/2018 12/26/2017 11/08/2017 11/08/2017  PHQ - 2 Score 4 0 4 5 0 0 1  PHQ- 9 Score 10 0 16 20 - - -  Exception Documentation - - - - - - -    Assessment:  Pleasant 62 year old female, resides with spouse/very supportive/assists as needed RN CM following pt for assistance with self management of COPD.  Pt had a recent hospitalization  April 24-27,2019 for COPD exacerbation.   COPD:  Auscultated congestion in all fields upon expiration.   O 2 sat at rest 95% on 2.5 Liters [place].      Pt coughing at times during home visit, per pt non productive.    Low BP:  BP today 92/50 to which pt reports been running low, stays hydrated with water and Coke      Zero.  RN CM discussed with pt limiting caffeine drinks, can act as a diuretic/dehydrate.  Chronic pain: lower back radiating to left knee today, taking pain medication as needed as well as      Resting.        Plan:  As discussed with pt, continue to do nebulizer treatments and if no improvement with  Congestion to call PCP to which pt agreed.             As discussed with pt, to follow up again in 2 weeks telephonically.            Plan to send PCP [name] PA initial home visit encounter.    THN CM Care Plan Problem One     Most Recent Value  Care Plan Problem One  Hx of COPD- recent hospitalization   Role Documenting the Problem One  Care Management Coordinator  Care Plan for Problem One  Active  THN Long Term Goal   Pt would have no issues with self management of COPD in the next 5 days   THN Long Term Goal Start Date  02/16/18  Interventions for Problem One Long Term Goal  initial home visit/assessment done, provided and reviewed information on COPD/action plan/yellow zone- when to call MD   [facility] CM Short Term  Goal #1   Pt would take all medications as orderd for the next 30 days   THN CM Short Term Goal #1 Start Date  02/16/18  Interventions for Short Term Goal #1  medications reviewed with pt, discussed ongoing use of rescue inhaler and neb treatments  as needed.   THN CM Short Term Goal #2   Pt would keep all MD appointments in the next 30 days   THN CM Short Term Goal #2 Start Date  02/16/18  Interventions for Short Term Goal #2  discussed with pt visit with Lung MD next month, following up with PCP      [name].   [place] Care Management  [phone]

## 2018-02-28 DIAGNOSIS — J9610 Chronic respiratory failure, unspecified whether with hypoxia or hypercapnia: Secondary | ICD-10-CM | POA: Diagnosis not present

## 2018-03-01 ENCOUNTER — Ambulatory Visit (INDEPENDENT_AMBULATORY_CARE_PROVIDER_SITE_OTHER): Payer: Medicare HMO | Admitting: Family Medicine

## 2018-03-01 ENCOUNTER — Encounter: Payer: Self-pay | Admitting: Family Medicine

## 2018-03-01 VITALS — BP 102/60 | HR 81 | Temp 98.5°F | Wt 239.4 lb

## 2018-03-01 DIAGNOSIS — R0902 Hypoxemia: Secondary | ICD-10-CM

## 2018-03-01 DIAGNOSIS — R52 Pain, unspecified: Secondary | ICD-10-CM | POA: Diagnosis not present

## 2018-03-01 DIAGNOSIS — I5032 Chronic diastolic (congestive) heart failure: Secondary | ICD-10-CM

## 2018-03-01 DIAGNOSIS — I2721 Secondary pulmonary arterial hypertension: Secondary | ICD-10-CM | POA: Diagnosis not present

## 2018-03-01 DIAGNOSIS — J449 Chronic obstructive pulmonary disease, unspecified: Secondary | ICD-10-CM | POA: Diagnosis not present

## 2018-03-01 NOTE — Progress Notes (Signed)
Patient: Theresa [name] Female    DOB: May 31, 1956   62 y.o.   MRN: [id number] Visit Date: 03/01/2018  Today's Provider: [name], PA   Chief Complaint  Patient presents with  . Hospitalization Follow-up   Subjective:    HPI  Follow up Hospitalization   Patient was admitted to [facility] on 02/07/18 and discharged on 02/10/18. She was treated for COPD exacerbation. Treatment for this included started Prednisone. Telephone follow up was done by [facility] care coordinator on 02/12/18 She reports good compliance with treatment. She reports this condition is slightly improved, but she is still SOB, increased sleeping and excessive sweating.   ------------------------------------------------------------------------------------ Past Medical History:  Diagnosis Date  . Acute postoperative pain 10/03/2017  . Anxiety   . Carpal tunnel syndrome   . CHF (congestive heart failure) ([place])    1/18  . CHF (congestive heart failure) ([place])   . Chronic generalized abdominal pain   . Chronic rhinitis   . COPD (chronic obstructive pulmonary disease) ([place])   . DDD (degenerative disc disease), cervical   . Depression   . Edema   . Flu    1/18  . Gastritis   . Hematuria   . Hypertension   . Kidney stones   . Low back pain   . Lumbar radiculitis   . Malodorous urine   . Muscle weakness   . Obesity   . Renal cyst   . Sensory urge incontinence   . Tobacco abuse    Past Surgical History:  Procedure Laterality Date  . ABDOMINAL HYSTERECTOMY    . CARPAL TUNNEL RELEASE Left   . TONSILLECTOMY    . TUBAL LIGATION     Family History  Problem Relation Age of Onset  . Heart disease Mother   . Stroke Mother   . Coronary artery disease Mother   . Lung cancer Sister   . Cancer Sister   . Alcohol abuse Father   . Heart disease Father   . Cancer Brother   . Cancer Brother   . Pneumonia Brother   . Prostate cancer Neg Hx   . Kidney cancer Neg Hx   . Bladder Cancer Neg Hx    Allergies    Allergen Reactions  . Codeine Nausea And Vomiting  . Meloxicam Other (See Comments)    Stomach pain    Current Outpatient Medications:  .  ADVAIR DISKUS 250-50 MCG/DOSE AEPB, Inhale 1 puff into the lungs 2 (two) times daily., Disp: , Rfl: 3 .  ALPRAZolam (XANAX) 0.5 MG tablet, Take 1 tablet (0.5 mg total) by mouth 2 (two) times daily as needed for anxiety., Disp: 60 tablet, Rfl: 0 .  aspirin 81 MG tablet, Take 81 mg by mouth daily., Disp: , Rfl:  .  bisoprolol-hydrochlorothiazide (ZIAC) 10-6.25 MG tablet, Take 1 tablet by mouth daily., Disp: , Rfl:  .  Cholecalciferol (CVS D3) 2000 units CAPS, Take 1 capsule by mouth daily., Disp: , Rfl:  .  DALIRESP 500 MCG TABS tablet, Take 500 mcg by mouth daily., Disp: , Rfl:  .  Fluticasone-Salmeterol (ADVAIR DISKUS) 250-50 MCG/DOSE AEPB, INHALE 1 PUFF INTO LUNGS TWICE A DAY, Disp: , Rfl:  .  furosemide (LASIX) 40 MG tablet, Take 1 tablet (40 mg total) by mouth 2 (two) times daily., Disp: 180 tablet, Rfl: 3 .  gabapentin (NEURONTIN) 100 MG capsule, Take 1-3 capsules (100-300 mg total) by mouth at bedtime. Follow written titration schedule., Disp: 90 capsule, Rfl: 2 .  [START ON  04/21/2018] HYDROcodone-acetaminophen (NORCO/VICODIN) 5-325 MG tablet, Take 1 tablet by mouth every 8 (eight) hours as needed for severe pain., Disp: 90 tablet, Rfl: 0 .  ipratropium-albuterol (DUONEB) 0.5-2.5 (3) MG/3ML SOLN, Inhale 3 mLs into the lungs every 6 (six) hours as needed for shortness of breath., Disp: , Rfl: 3 .  OXYGEN, Inhale 2.5 L into the lungs continuous. Reported on 11/03/2015, Disp: , Rfl:  .  potassium chloride SA (K-DUR,KLOR-CON) 20 MEQ tablet, Take 20 mEq by mouth 2 (two) times daily., Disp: , Rfl:  .  ranitidine (ZANTAC) 150 MG tablet, Take 150 mg by mouth 2 (two) times daily., Disp: , Rfl:  .  sertraline (ZOLOFT) 100 MG tablet, Take 2 tablets (200 mg total) by mouth every morning., Disp: 60 tablet, Rfl: 3 .  VENTOLIN HFA 108 (90 Base) MCG/ACT inhaler, Inhale  2 puffs into the lungs every 6 (six) hours as needed for shortness of breath., Disp: 1 Inhaler, Rfl: 5   Review of Systems  Constitutional: Positive for activity change and diaphoresis.  Respiratory: Positive for cough, shortness of breath and wheezing.   Cardiovascular: Negative.    Social History   Tobacco Use  . Smoking status: Current Every Day Smoker    Packs/day: 0.50    Years: 44.00    Pack years: 22.00    Types: Cigarettes  . Smokeless tobacco: Never Used  Substance Use Topics  . Alcohol use: No    Frequency: Never   Objective:   BP 102/60 (BP Location: Right Arm, Patient Position: Sitting, Cuff Size: Large)   Pulse 81   Temp 98.5 F (36.9 C) (Oral)   Wt 239 lb 6.4 oz (108.6 kg)   SpO2 97%   BMI 43.09 kg/m  Wt Readings from Last 3 Encounters:  03/01/18 239 lb 6.4 oz (108.6 kg)  02/21/18 232 lb (105.2 kg)  02/19/18 232 lb (105.2 kg)     Physical Exam  Constitutional: She is oriented to person, place, and time. She appears well-developed and well-nourished. No distress.  HENT:  Head: Normocephalic and atraumatic.  Right Ear: Hearing normal.  Left Ear: Hearing normal.  Nose: Nose normal.  Eyes: Conjunctivae and lids are normal. Right eye exhibits no discharge. Left eye exhibits no discharge. No scleral icterus.  Cardiovascular: Normal rate and regular rhythm.  Pulmonary/Chest: She is in respiratory distress. She has wheezes.  Distant breath sounds with few rhonchi with slight wheezes.  Musculoskeletal: Normal range of motion. She exhibits no edema.  Neurological: She is alert and oriented to person, place, and time.  Skin: Skin is intact. No lesion and no rash noted.  Psychiatric: She has a normal mood and affect. Her speech is normal and behavior is normal. Thought content normal.      Assessment & Plan:     1. COPD with hypoxia ([place]) Continues to have dyspnea even on oxygen at 2.5 LPM continuously by nasal cannula and using BiPAP at night. Uses Duoneb 3  cc qd 6 hours by nebulizer prn dyspnea, Ventolin-HFA 2 puffs qid prn wheeze, Advair 250-50 mcg/dose 1 inhalation BID and followed by Dr. Raul Del (pulmonologist). Hospitalized 02-07-18 to 02-10-18 for acute flare and CHF. Still having dyspnea and fatigue. Unable to walk very far due to dyspnea and brought in by wheelchair today by husband. Eating well and notices more difficulty breathing if she gets bloated or overeats. Seems stable at the present but prognosis is only fair. Continue present regimen and follow up with Dr. Raul Del as planned in 4-6  weeks. - CBC with Differential/Platelet - Comprehensive metabolic panel  2. Chronic diastolic heart failure (HCC) No peripheral edema today. Followed at the Heart Failure Clinic and still on Lasix 40 mg BID since hospitalization. Recheck labs for electrolyte imbalance and hypovolemia. - CBC with Differential/Platelet - Comprehensive metabolic panel  3. 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HCC) - CBC with Differential/Platelet - Comprehensive metabolic panel  4. Pain management Followed by Dr. 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 Presently on Norco and Gabapentin by mouth with spinal steroid injections a month ago. Continue follow up with Dr. Dossie Arbour for facet blocks as planned at the 02-19-18 visit. - Comprehensive metabolic panel        [name], PA  [facility]

## 2018-03-02 ENCOUNTER — Telehealth: Payer: Self-pay

## 2018-03-02 LAB — CBC WITH DIFFERENTIAL/PLATELET
Basophils Absolute: 0 10*3/uL (ref 0.0–0.2)
Basos: 0 %
EOS (ABSOLUTE): 0.1 10*3/uL (ref 0.0–0.4)
Eos: 1 %
Hematocrit: 41.9 % (ref 34.0–46.6)
Hemoglobin: 13.7 g/dL (ref 11.1–15.9)
Immature Grans (Abs): 0 10*3/uL (ref 0.0–0.1)
Immature Granulocytes: 0 %
Lymphocytes Absolute: 0.7 10*3/uL (ref 0.7–3.1)
Lymphs: 7 %
MCH: 32.2 pg (ref 26.6–33.0)
MCHC: 32.7 g/dL (ref 31.5–35.7)
MCV: 98 fL — ABNORMAL HIGH (ref 79–97)
Monocytes Absolute: 0.6 10*3/uL (ref 0.1–0.9)
Monocytes: 6 %
Neutrophils Absolute: 9.1 10*3/uL — ABNORMAL HIGH (ref 1.4–7.0)
Neutrophils: 86 %
Platelets: 178 10*3/uL (ref 150–379)
RBC: 4.26 x10E6/uL (ref 3.77–5.28)
RDW: 15.4 % (ref 12.3–15.4)
WBC: 10.5 10*3/uL (ref 3.4–10.8)

## 2018-03-02 LAB — COMPREHENSIVE METABOLIC PANEL
ALT: 20 IU/L (ref 0–32)
AST: 18 IU/L (ref 0–40)
Albumin/Globulin Ratio: 1.6 (ref 1.2–2.2)
Albumin: 4 g/dL (ref 3.6–4.8)
Alkaline Phosphatase: 107 IU/L (ref 39–117)
BUN/Creatinine Ratio: 17 (ref 12–28)
BUN: 12 mg/dL (ref 8–27)
Bilirubin Total: 0.3 mg/dL (ref 0.0–1.2)
CO2: 26 mmol/L (ref 20–29)
Calcium: 9 mg/dL (ref 8.7–10.3)
Chloride: 102 mmol/L (ref 96–106)
Creatinine, Ser: 0.7 mg/dL (ref 0.57–1.00)
GFR calc Af Amer: 109 mL/min/{1.73_m2} (ref >59)
GFR calc non Af Amer: 94 mL/min/{1.73_m2} (ref >59)
Globulin, Total: 2.5 g/dL (ref 1.5–4.5)
Glucose: 95 mg/dL (ref 65–99)
Potassium: 4.2 mmol/L (ref 3.5–5.2)
Sodium: 143 mmol/L (ref 134–144)
Total Protein: 6.5 g/dL (ref 6.0–8.5)

## 2018-03-02 NOTE — Telephone Encounter (Signed)
Patient advised.

## 2018-03-02 NOTE — Telephone Encounter (Signed)
LMTCB

## 2018-03-02 NOTE — Telephone Encounter (Signed)
-----  Message from Margo Common, [place] sent at 03/02/2018  8:26 AM EDT ----- All blood tests essentially normal now. Proceed with scheduled follow up appointments with heart failure clinic, pulmonologist and pain clinic. Recheck here as needed in 4-6 months.

## 2018-03-03 DIAGNOSIS — J9610 Chronic respiratory failure, unspecified whether with hypoxia or hypercapnia: Secondary | ICD-10-CM | POA: Diagnosis not present

## 2018-03-07 ENCOUNTER — Other Ambulatory Visit: Payer: Self-pay | Admitting: *Deleted

## 2018-03-07 ENCOUNTER — Ambulatory Visit: Payer: Self-pay | Admitting: *Deleted

## 2018-03-07 NOTE — Discharge Summary (Signed)
[place] [facility] [place] NAME: Theresa [name]    MR#:  [id number]  DATE OF BIRTH:  [birth year]  DATE OF ADMISSION:  02/07/2018 ADMITTING PHYSICIAN: Saundra Shelling, MD  DATE OF DISCHARGE: 02/10/2018  PRIMARY CARE PHYSICIAN: Chrismon, Vickki Muff, PA    ADMISSION DIAGNOSIS:  COPD exacerbation ([place]) [J44.1]  DISCHARGE DIAGNOSIS:  Acute on Chronic COPD exacerbation  SECONDARY DIAGNOSIS:   Past Medical History:  Diagnosis Date  . Acute postoperative pain 10/03/2017  . Anxiety   . Carpal tunnel syndrome   . CHF (congestive heart failure) ([place])    1/18  . CHF (congestive heart failure) ([place])   . Chronic generalized abdominal pain   . Chronic rhinitis   . COPD (chronic obstructive pulmonary disease) ([place])   . DDD (degenerative disc disease), cervical   . Depression   . Edema   . Flu    1/18  . Gastritis   . Hematuria   . Hypertension   . Kidney stones   . Low back pain   . Lumbar radiculitis   . Malodorous urine   . Muscle weakness   . Obesity   . Renal cyst   . Sensory urge incontinence   . Tobacco abuse     HOSPITAL COURSE:  62 year old female patient with history of congestive heart failure, emphysema, on home oxygen, hypertension, anxiety disorder presented to the emergency room with shortness of breath and wheezing.  *Acute on chronic COPD exacerbation after exposure to cold air in the early hours of Easter Sunday service and pollen Nebulization treatments Oxygen via nasal cannula patient on IV Solu-Medrol--taper slowly po steroids cxr no PNA... No indication for antibiotic  *Acute on chronic respiratory failure with hypoxia Continue oxygen via nasal cannula, bipap at night  *Anxiety disorder Anxiolytic medication  *Chronic congestive heart failure-diastolic per echo Continue Lasix for diuresis  *Hypokalemia Replace potassium  *DVT prophylaxis with subcu Lovenox daily  *Tobacco cessation consult  for69mnutes Nicotine patch offered  Overall improving. D/chome  CONSULTS OBTAINED:    DRUG ALLERGIES:   Allergies  Allergen Reactions  . Codeine Nausea And Vomiting  . Meloxicam Other (See Comments)    Stomach pain    DISCHARGE MEDICATIONS:   Allergies as of 02/10/2018      Reactions   Codeine Nausea And Vomiting   Meloxicam Other (See Comments)   Stomach pain      Medication List    STOP taking these medications   BC FAST PAIN RELIEF ARTHRITIS 1000-65 MG Pack Generic drug:  Aspirin-Caffeine     TAKE these medications   ADVAIR DISKUS 250-50 MCG/DOSE Aepb Generic drug:  Fluticasone-Salmeterol INHALE 1 PUFF INTO LUNGS TWICE A DAY   ADVAIR DISKUS 250-50 MCG/DOSE Aepb Generic drug:  Fluticasone-Salmeterol Inhale 1 puff into the lungs 2 (two) times daily.   ALPRAZolam 0.5 MG tablet Commonly known as:  XANAX Take 1 tablet (0.5 mg total) by mouth 2 (two) times daily as needed for anxiety.   bisoprolol-hydrochlorothiazide 10-6.25 MG tablet Commonly known as:  ZIAC Take 1 tablet by mouth daily.   CVS D3 2000 units Caps Generic drug:  Cholecalciferol Take 1 capsule by mouth daily.   DALIRESP 500 MCG Tabs tablet Generic drug:  roflumilast Take 500 mcg by mouth daily.   gabapentin 100 MG capsule Commonly known as:  NEURONTIN Take 1-3 capsules (100-300 mg total) by mouth at bedtime. Follow written titration schedule.   HYDROcodone-acetaminophen 5-325 MG tablet Commonly known as:  NORCO/VICODIN Take 1 tablet by mouth every 8 (eight) hours as needed for severe pain. Start taking on:  04/21/2018 What changed:  Another medication with the same name was removed. Continue taking this medication, and follow the directions you see here.   ipratropium-albuterol 0.5-2.5 (3) MG/3ML Soln Commonly known as:  DUONEB Inhale 3 mLs into the lungs every 6 (six) hours as needed for shortness of breath.   OXYGEN Inhale 2.5 L into the lungs continuous. Reported on 11/03/2015    potassium chloride SA 20 MEQ tablet Commonly known as:  K-DUR,KLOR-CON Take 20 mEq by mouth 2 (two) times daily.   ranitidine 150 MG tablet Commonly known as:  ZANTAC Take 150 mg by mouth 2 (two) times daily.   sertraline 100 MG tablet Commonly known as:  ZOLOFT Take 2 tablets (200 mg total) by mouth every morning.   VENTOLIN HFA 108 (90 Base) MCG/ACT inhaler Generic drug:  albuterol Inhale 2 puffs into the lungs every 6 (six) hours as needed for shortness of breath. What changed:  Another medication with the same name was removed. Continue taking this medication, and follow the directions you see here.       If you experience worsening of your admission symptoms, develop shortness of breath, life threatening emergency, suicidal or homicidal thoughts you must seek medical attention immediately by calling 911 or calling your MD immediately  if symptoms less severe.  You Must read complete instructions/literature along with all the possible adverse reactions/side effects for all the Medicines you take and that have been prescribed to you. Take any new Medicines after you have completely understood and accept all the possible adverse reactions/side effects.   Please note  You were cared for by a hospitalist during your hospital stay. If you have any questions about your discharge medications or the care you received while you were in the hospital after you are discharged, you can call the unit and asked to speak with the hospitalist on call if the hospitalist that took care of you is not available. Once you are discharged, your primary care physician will handle any further medical issues. Please note that NO REFILLS for any discharge medications will be authorized once you are discharged, as it is imperative that you return to your primary care physician (or establish a relationship with a primary care physician if you do not have one) for your aftercare needs so that they can reassess your  need for medications and monitor your lab values. Today   SUBJECTIVE   Feeling ok VITAL SIGNS:  Blood pressure 108/67, pulse 72, temperature 97.8 F (36.6 C), temperature source Oral, resp. rate 18, height _0  (1.6 m), weight 104.8 kg (231 lb 1.6 oz), SpO2 93 %.  I/O:  No intake or output data in the 24 hours ending 03/07/18 0730  PHYSICAL EXAMINATION:  GENERAL:  62 y.o.-year-old patient lying in the bed with no acute distress.  EYES: Pupils equal, round, reactive to light and accommodation. No scleral icterus. Extraocular muscles intact.  HEENT: Head atraumatic, normocephalic. Oropharynx and nasopharynx clear.  NECK:  Supple, no jugular venous distention. No thyroid enlargement, no tenderness.  LUNGS: coarse breath sounds bilaterally, no wheezing, rales,rhonchi or crepitation. No use of accessory muscles of respiration.  CARDIOVASCULAR: S1, S2 normal. No murmurs, rubs, or gallops.  ABDOMEN: Soft, non-tender, non-distended. Bowel sounds present. No organomegaly or mass.  EXTREMITIES: No pedal edema, cyanosis, or clubbing.  NEUROLOGIC: Cranial nerves II through XII are intact. Muscle strength 5/5 in  all extremities. Sensation intact. Gait not checked.  PSYCHIATRIC: The patient is alert and oriented x 3.  SKIN: No obvious rash, lesion, or ulcer.   DATA REVIEW:   CBC  Recent Labs  Lab 03/01/18 1611  WBC 10.5  HGB 13.7  HCT 41.9  PLT 178    Chemistries  Recent Labs  Lab 03/01/18 1611  NA 143  K 4.2  CL 102  CO2 26  GLUCOSE 95  BUN 12  CREATININE 0.70  CALCIUM 9.0  AST 18  ALT 20  ALKPHOS 107  BILITOT 0.3    Microbiology Results   No results found for this or any previous visit (from the past 240 hour(s)).  RADIOLOGY:  No results found.   Management plans discussed with the patient, family and they are in agreement.  CODE STATUS:  Code Status History    Date Active Date Inactive Code Status Order ID Comments User Context   02/07/2018 2130 02/10/2018  1827 Full Code [id number]  Saundra Shelling, MD Inpatient   12/09/2017 2355 12/11/2017 1616 Full Code [id number]  Omar Person, NP ED   02/13/2017 2039 02/16/2017 1411 Full Code [id number]  Demetrios Loll, MD ED   10/25/2016 0346 11/03/2016 1636 Full Code [id number]  Awilda Bill, NP ED   11/03/2015 0245 11/04/2015 1428 Full Code [id number]  Harrie Foreman, MD Inpatient   06/29/2015 0312 07/04/2015 1555 Full Code [id number]  Hower, Aaron Mose, MD ED   04/18/2015 0559 04/21/2015 2146 Full Code [id number]  Harrie Foreman, MD Inpatient      TOTAL TIME TAKING CARE OF THIS PATIENT: *40** minutes.    Fritzi Mandes M.D on 03/07/2018 at 7:30 AM  Between 7am to 6pm - Pager - [phone] After 6pm go to [email or web address] - password [email or web address] [place] [facility]  Office  [phone]  CC: Primary care physician; Chrismon, Vickki Muff, PA

## 2018-03-07 NOTE — Patient Outreach (Addendum)
[facility]) Care Management  03/07/2018  Theresa [name] 1955/12/15 [id number]  Unsuccessful telephone encounter to [name], 62 year old female, follow up on current  Clinical status.   Pt referred by [facility] inpatient RN CM for [name] care management  Services, recent hospitalization April 24-27,2019 for COPD exacerbation. PCP office on list  For doing their own transition of care call.   Home visit done by  RN CM  02/21/18.  HIPAA compliant voice message left with RN CM contact information.   Plan:  If no response to voice message left, to send unsuccessful contact letter and if no  Response in 10 business days, to close case.   RN CM to make next outreach call in 2  Business days.    [name].   [place]  [phone]

## 2018-03-09 ENCOUNTER — Other Ambulatory Visit: Payer: Self-pay | Admitting: *Deleted

## 2018-03-09 ENCOUNTER — Ambulatory Visit: Payer: Self-pay | Admitting: *Deleted

## 2018-03-09 NOTE — Patient Outreach (Signed)
[facility]) Care Management  03/09/2018  Theresa [name] Jun 01, 1956 [id number]   Second unsuccessful outreach call to Theresa [name], 62 year old female, follow  Up on current clinical status.  Recent hospitalization April 24-27,2019 for COPD Exacerbation.  HI{PAA compliant voice message left with RN CM's contact  Information.    Plan:  If no response to voice message left, plan to make a third outreach call  Next week.    [name].   [place] [facility]  [phone]

## 2018-03-12 NOTE — Progress Notes (Signed)
Patient ID: Theresa [name], female    DOB: 1956-05-28, 62 y.o.   MRN: [id number]  HPI Theresa [name] is a 62 y/o female with a history of chronic low back pain, HTN, depression, COPD on long-term oxygen, anxiety, carpal tunnel syndrome, long-standing tobacco use and chronic heart failure.  Reviewed echo report on 12/11/17 which showed an EF of 55-60% along with trivial AR and an elevated PA pressure of 58 mm Hg. Echo done 11/03/15 but unable to access results. Echo done on 02/19/15 showed an EF of 50% without valvular stenosis. Recent PFT's done on 01/18/16  Admitted 02/07/18 due to acute on chronic COPD. Nebulizer and IV solu-medrol were given. Discharged after 3 days. Admitted 12/09/17 due to respiratory failure/altered mental status. Had to be intubated initially. Received steroids and nebulizer treatments. Successfully extubated and she left AMA after 2 days. Was in the ED 10/17/17 due to epigastric pain. PPI increased with follow-up with GI. Released that day.   She presents today for a follow-up visit with a chief complaint of moderate  shortness of breath upon minimal exertion. She describes this as chronic in nature having been present for several years with varying levels of severity. She has associated fatigue, cough, wheezing, easy bruising, anxiety and difficulty sleeping along with this. She denies any abdominal distention, palpitations, pedal edema, chest pain, dizziness or weight gain.   Past Medical History:  Diagnosis Date  . Acute postoperative pain 10/03/2017  . Anxiety   . Carpal tunnel syndrome   . CHF (congestive heart failure) ([place])    1/18  . CHF (congestive heart failure) ([place])   . Chronic generalized abdominal pain   . Chronic rhinitis   . COPD (chronic obstructive pulmonary disease) ([place])   . DDD (degenerative disc disease), cervical   . Depression   . Edema   . Flu    1/18  . Gastritis   . Hematuria   . Hypertension   . Kidney stones   . Low back pain   . Lumbar  radiculitis   . Malodorous urine   . Muscle weakness   . Obesity   . Renal cyst   . Sensory urge incontinence   . Tobacco abuse    Past Surgical History:  Procedure Laterality Date  . ABDOMINAL HYSTERECTOMY    . CARPAL TUNNEL RELEASE Left   . TONSILLECTOMY    . TUBAL LIGATION     Family History  Problem Relation Age of Onset  . Heart disease Mother   . Stroke Mother   . Coronary artery disease Mother   . Lung cancer Sister   . Cancer Sister   . Alcohol abuse Father   . Heart disease Father   . Cancer Brother   . Cancer Brother   . Pneumonia Brother   . Prostate cancer Neg Hx   . Kidney cancer Neg Hx   . Bladder Cancer Neg Hx    Social History   Tobacco Use  . Smoking status: Current Every Day Smoker    Packs/day: 0.50    Years: 44.00    Pack years: 22.00    Types: Cigarettes  . Smokeless tobacco: Never Used  Substance Use Topics  . Alcohol use: No    Frequency: Never   Allergies  Allergen Reactions  . Codeine Nausea And Vomiting  . Meloxicam Other (See Comments)    Stomach pain   Prior to Admission medications   Medication Sig Start Date End Date Taking? Authorizing Provider  ADVAIR Randalyn Rhea  250-50 MCG/DOSE AEPB Inhale 1 puff into the lungs 2 (two) times daily. 11/29/17  Yes [provider]  ALPRAZolam Duanne Moron) 0.5 MG tablet Take 1 tablet (0.5 mg total) by mouth 2 (two) times daily as needed for anxiety. 12/09/16  Yes Darylene Price A, FNP  aspirin 81 MG tablet Take 81 mg by mouth daily.   Yes [provider]  bisoprolol-hydrochlorothiazide (ZIAC) 10-6.25 MG tablet Take 1 tablet by mouth daily. 12/05/17  Yes [provider]  Cholecalciferol ([employer] D3) 2000 units CAPS Take 1 capsule by mouth daily.   Yes [provider]  DALIRESP 500 MCG TABS tablet Take 500 mcg by mouth daily. 12/05/17  Yes [provider]  furosemide (LASIX) 40 MG tablet Take 1 tablet (40 mg total) by mouth 2 (two) times daily. 02/16/18  Yes Gergory Biello, Otila Kluver A,  FNP  gabapentin (NEURONTIN) 100 MG capsule Take 1-3 capsules (100-300 mg total) by mouth at bedtime. Follow written titration schedule. 01/31/18 05/01/18 Yes Vevelyn Francois, NP  HYDROcodone-acetaminophen (NORCO/VICODIN) 5-325 MG tablet Take 1 tablet by mouth every 8 (eight) hours as needed for severe pain. 04/21/18 05/21/18 Yes King, Diona Foley, NP  ipratropium-albuterol (DUONEB) 0.5-2.5 (3) MG/3ML SOLN Inhale 3 mLs into the lungs every 6 (six) hours as needed for shortness of breath. 11/15/17  Yes [provider]  OXYGEN Inhale 2.5 L into the lungs continuous. Reported on 11/03/2015   Yes [provider]  potassium chloride SA (K-DUR,KLOR-CON) 20 MEQ tablet Take 20 mEq by mouth 2 (two) times daily. 12/05/17  Yes [provider]  ranitidine (ZANTAC) 150 MG tablet Take 150 mg by mouth 2 (two) times daily.   Yes [provider]  sertraline (ZOLOFT) 100 MG tablet Take 2 tablets (200 mg total) by mouth every morning. 01/15/18  Yes Chrismon, Vickki Muff, PA  VENTOLIN HFA 108 (90 Base) MCG/ACT inhaler Inhale 2 puffs into the lungs every 6 (six) hours as needed for shortness of breath. 02/10/18  Yes Fritzi Mandes, MD    Review of Systems  Constitutional: Positive for fatigue. Negative for appetite change.  HENT: Negative for congestion, postnasal drip and sore throat.   Eyes: Negative.   Respiratory: Positive for cough, shortness of breath and wheezing. Negative for chest tightness.   Cardiovascular: Negative for chest pain, palpitations and leg swelling.  Gastrointestinal: Negative for abdominal distention, abdominal pain and nausea.  Endocrine: Negative.   Genitourinary: Negative.   Musculoskeletal: Positive for back pain. Negative for neck pain.  Skin: Negative.   Allergic/Immunologic: Negative.   Neurological: Negative for dizziness and light-headedness.  Hematological: Negative for adenopathy. Bruises/bleeds easily.  Psychiatric/Behavioral: Positive for dysphoric mood and  sleep disturbance (wearing oxygen & bipap at night). Negative for agitation and suicidal ideas. The patient is nervous/anxious.    Vitals:   03/14/18 1325  BP: 94/68  Pulse: 91  Resp: 18  SpO2: 91%  Weight: 237 lb 6 oz (107.7 kg)  Height: _0  (1.575 m)   Wt Readings from Last 3 Encounters:  03/14/18 237 lb 6 oz (107.7 kg)  03/01/18 239 lb 6.4 oz (108.6 kg)  02/21/18 232 lb (105.2 kg)   Lab Results  Component Value Date   CREATININE 0.70 03/01/2018   CREATININE 0.59 02/08/2018   CREATININE 0.62 02/07/2018    Physical Exam  Constitutional: She is oriented to person, place, and time. She appears well-developed and well-nourished.  HENT:  Head: Normocephalic and atraumatic.  Neck: Normal range of motion. Neck supple. No JVD present.  Cardiovascular: Normal rate and regular rhythm.  Pulmonary/Chest: Effort normal. She has wheezes in the right lower field and the left lower field. She has no rales.  Abdominal: Soft. She exhibits no distension. There is no tenderness.  Musculoskeletal: She exhibits no edema or tenderness.  Neurological: She is alert and oriented to person, place, and time.  Skin: Skin is warm and dry.  Psychiatric: Her behavior is normal. Thought content normal. Her mood appears anxious. She does not exhibit a depressed mood.  Nursing note and vitals reviewed.     Assessment & Plan:  1: Chronic heart failure with preserved ejection fraction- - NYHA class III - euvolemic today - continues to weigh daily at home. Reminded to call for an overnight weight gain of >2 pounds or a weekly weight gain of >5 pounds - by our scale, her weight is 4 pounds down from the last time she was here  - continues to take 87m furosemide BID along with 264m potassium BID - monitor sodium content carefully - saw cardiologist (CClayborn Bigness11/19/18  - does not fit criteria for REDS vest reading due to elevated BMI - BNP 12/10/17 was 333.4  2: HTN- - BP on the low side - saw PCP  (Chrismon) 03/01/18 - BMP from 03/01/18 reviewed and showed sodium 143, potassium 4.2 and GFR 94  3: COPD- - wearing bipap nightly - oxygen at 2.5L at bedtime and then as needed during the day - last saw pulmonology (FRaul Delon 01/02/18 - using nebulizer  4: Tobacco use- - alternates between the lozenges, gum and actual cigarettes - removes herself from the oxygen when smoking - complete cessation discussed for 3 minutes with her  Patient did not bring her medications nor a list. Each medication was verbally reviewed with the patient and she was encouraged to bring the bottles to every visit to confirm accuracy of list.  Return in 4 months or sooner for any questions/problems before then.  .Marland Kitchen

## 2018-03-14 ENCOUNTER — Ambulatory Visit: Payer: Medicare HMO | Attending: Family | Admitting: Family

## 2018-03-14 ENCOUNTER — Encounter: Payer: Self-pay | Admitting: Family

## 2018-03-14 VITALS — BP 94/68 | HR 91 | Resp 18 | Ht 62.0 in | Wt 237.4 lb

## 2018-03-14 DIAGNOSIS — E669 Obesity, unspecified: Secondary | ICD-10-CM | POA: Insufficient documentation

## 2018-03-14 DIAGNOSIS — Z720 Tobacco use: Secondary | ICD-10-CM

## 2018-03-14 DIAGNOSIS — F329 Major depressive disorder, single episode, unspecified: Secondary | ICD-10-CM | POA: Diagnosis not present

## 2018-03-14 DIAGNOSIS — I110 Hypertensive heart disease with heart failure: Secondary | ICD-10-CM | POA: Diagnosis not present

## 2018-03-14 DIAGNOSIS — Z885 Allergy status to narcotic agent status: Secondary | ICD-10-CM | POA: Diagnosis not present

## 2018-03-14 DIAGNOSIS — Z87442 Personal history of urinary calculi: Secondary | ICD-10-CM | POA: Insufficient documentation

## 2018-03-14 DIAGNOSIS — G8929 Other chronic pain: Secondary | ICD-10-CM | POA: Diagnosis not present

## 2018-03-14 DIAGNOSIS — Z7982 Long term (current) use of aspirin: Secondary | ICD-10-CM | POA: Diagnosis not present

## 2018-03-14 DIAGNOSIS — R4182 Altered mental status, unspecified: Secondary | ICD-10-CM | POA: Insufficient documentation

## 2018-03-14 DIAGNOSIS — F419 Anxiety disorder, unspecified: Secondary | ICD-10-CM | POA: Insufficient documentation

## 2018-03-14 DIAGNOSIS — I5032 Chronic diastolic (congestive) heart failure: Secondary | ICD-10-CM | POA: Insufficient documentation

## 2018-03-14 DIAGNOSIS — G5600 Carpal tunnel syndrome, unspecified upper limb: Secondary | ICD-10-CM | POA: Insufficient documentation

## 2018-03-14 DIAGNOSIS — Z79899 Other long term (current) drug therapy: Secondary | ICD-10-CM | POA: Insufficient documentation

## 2018-03-14 DIAGNOSIS — F17210 Nicotine dependence, cigarettes, uncomplicated: Secondary | ICD-10-CM | POA: Diagnosis not present

## 2018-03-14 DIAGNOSIS — M545 Low back pain: Secondary | ICD-10-CM | POA: Insufficient documentation

## 2018-03-14 DIAGNOSIS — R82998 Other abnormal findings in urine: Secondary | ICD-10-CM | POA: Insufficient documentation

## 2018-03-14 DIAGNOSIS — Z9981 Dependence on supplemental oxygen: Secondary | ICD-10-CM | POA: Insufficient documentation

## 2018-03-14 DIAGNOSIS — J410 Simple chronic bronchitis: Secondary | ICD-10-CM

## 2018-03-14 DIAGNOSIS — J449 Chronic obstructive pulmonary disease, unspecified: Secondary | ICD-10-CM | POA: Insufficient documentation

## 2018-03-14 DIAGNOSIS — J9690 Respiratory failure, unspecified, unspecified whether with hypoxia or hypercapnia: Secondary | ICD-10-CM | POA: Diagnosis not present

## 2018-03-14 DIAGNOSIS — Z888 Allergy status to other drugs, medicaments and biological substances status: Secondary | ICD-10-CM | POA: Diagnosis not present

## 2018-03-14 DIAGNOSIS — I10 Essential (primary) hypertension: Secondary | ICD-10-CM

## 2018-03-14 NOTE — Patient Instructions (Signed)
Continue weighing daily and call for an overnight weight gain of > 2 pounds or a weekly weight gain of >5 pounds. 

## 2018-03-15 ENCOUNTER — Other Ambulatory Visit: Payer: Self-pay | Admitting: *Deleted

## 2018-03-15 ENCOUNTER — Ambulatory Visit: Payer: Self-pay | Admitting: *Deleted

## 2018-03-15 NOTE — Patient Outreach (Addendum)
[facility]) Care Management  03/15/2018  Theresa [name] 1956/02/04 [id number]   Third unsuccessful telephone encounter to Theresa [name], 62 year old female for follow up On Current clinical status, pt followed for chronic disease management of COPD. Pt  referred by Hoy Finlay [facility] inpatient RN CM for [facility] care  Management services, follow up on recent hospitalization April 24-27,2019 for COPD  Exacerbation.  PCP on list for doing their own Transition of care calls.  This RN CM  Did a home visit pt on 02/21/18 and 2 previous phone call attempts to follow up, voice  Messages left with no responses as well as unsuccessful outreach letter sent.  HIPPA compliant voice message left with RN CM's contact information.    Plan:  With this being the third outreach call if no response will transfer pt to another RN CM to close case next week if no response to unsuccessful outreach letter.   [name].   [place] [facility] Management  [phone]

## 2018-03-16 ENCOUNTER — Telehealth: Payer: Self-pay | Admitting: Family Medicine

## 2018-03-16 DIAGNOSIS — I5032 Chronic diastolic (congestive) heart failure: Secondary | ICD-10-CM

## 2018-03-16 DIAGNOSIS — R609 Edema, unspecified: Secondary | ICD-10-CM

## 2018-03-16 NOTE — Telephone Encounter (Signed)
Seen her cardio Dr. Jackelyn Hoehn and she suggested she have thyroid panel done when she evaluated her.  She told her to call her primary   Pt's call back is [phone]  Thanks teri

## 2018-03-16 NOTE — Telephone Encounter (Signed)
Tried to call but no answer. Can easily order thyroid panel blood test to evaluate edema and CHF diagnoses.

## 2018-03-21 ENCOUNTER — Other Ambulatory Visit: Payer: Self-pay | Admitting: *Deleted

## 2018-03-21 NOTE — Patient Outreach (Signed)
[place] [facility]) Care Management [facility] CM Case Closure  03/21/2018  Theresa [name] August 07, 1956 [id number]  [facility] CM Case Closure note re: Theresa [name], 62 year old female referred to [place] after recent hospitalization April 24-27,2019 for COPD exacerbation. Verified that three unsuccessful telephone outreach attempts had previously been placed to patient; [facility] CM unsuccessful outreach letter mailed 03/07/18, without further response back form patient.  Plan:  Will close [place] case and will notify patient's PCP of same  Oneta Rack, RN, BSN, [place] [facility]  [phone]

## 2018-03-22 DIAGNOSIS — J9610 Chronic respiratory failure, unspecified whether with hypoxia or hypercapnia: Secondary | ICD-10-CM | POA: Diagnosis not present

## 2018-03-22 NOTE — Telephone Encounter (Signed)
Patient advised. Labs slipped print at the front desk in [address].

## 2018-03-23 DIAGNOSIS — I5032 Chronic diastolic (congestive) heart failure: Secondary | ICD-10-CM | POA: Diagnosis not present

## 2018-03-23 DIAGNOSIS — R609 Edema, unspecified: Secondary | ICD-10-CM | POA: Diagnosis not present

## 2018-03-24 LAB — THYROID PANEL WITH TSH
Free Thyroxine Index: 1 — ABNORMAL LOW (ref 1.2–4.9)
T3 Uptake Ratio: 22 % — ABNORMAL LOW (ref 24–39)
T4, Total: 4.4 ug/dL — ABNORMAL LOW (ref 4.5–12.0)
TSH: 0.468 u[IU]/mL (ref 0.450–4.500)

## 2018-03-26 ENCOUNTER — Telehealth: Payer: Self-pay

## 2018-03-26 NOTE — Telephone Encounter (Signed)
LMTCB

## 2018-03-26 NOTE — Telephone Encounter (Signed)
-----  Message from Margo Common, [place] sent at 03/26/2018  8:12 AM EDT ----- Low functioning thyroid. Add Levothyroxine 25 mcg qd #30 & 3 RF. Recheck blood levels in 2 months.

## 2018-03-27 MED ORDER — LEVOTHYROXINE SODIUM 25 MCG PO TABS: 25.0000 ug | ORAL_TABLET | Freq: Every day | ORAL | 3 refills | Status: DC

## 2018-03-27 NOTE — Telephone Encounter (Signed)
Patient advised. RX sent to [facility]. Patient will call back to request lab slip in 2 months to repeat thyroid labs.

## 2018-03-29 DIAGNOSIS — R0902 Hypoxemia: Secondary | ICD-10-CM | POA: Diagnosis not present

## 2018-03-29 DIAGNOSIS — F411 Generalized anxiety disorder: Secondary | ICD-10-CM | POA: Diagnosis not present

## 2018-03-29 DIAGNOSIS — R600 Localized edema: Secondary | ICD-10-CM | POA: Diagnosis not present

## 2018-03-29 DIAGNOSIS — R0602 Shortness of breath: Secondary | ICD-10-CM | POA: Diagnosis not present

## 2018-03-29 DIAGNOSIS — J449 Chronic obstructive pulmonary disease, unspecified: Secondary | ICD-10-CM | POA: Diagnosis not present

## 2018-03-29 DIAGNOSIS — F17200 Nicotine dependence, unspecified, uncomplicated: Secondary | ICD-10-CM | POA: Diagnosis not present

## 2018-03-29 DIAGNOSIS — E669 Obesity, unspecified: Secondary | ICD-10-CM | POA: Diagnosis not present

## 2018-03-29 DIAGNOSIS — I272 Pulmonary hypertension, unspecified: Secondary | ICD-10-CM | POA: Diagnosis not present

## 2018-03-29 DIAGNOSIS — I10 Essential (primary) hypertension: Secondary | ICD-10-CM | POA: Diagnosis not present

## 2018-03-31 DIAGNOSIS — J9610 Chronic respiratory failure, unspecified whether with hypoxia or hypercapnia: Secondary | ICD-10-CM | POA: Diagnosis not present

## 2018-04-03 DIAGNOSIS — Z6841 Body Mass Index (BMI) 40.0 and over, adult: Secondary | ICD-10-CM | POA: Diagnosis not present

## 2018-04-03 DIAGNOSIS — R0609 Other forms of dyspnea: Secondary | ICD-10-CM | POA: Diagnosis not present

## 2018-04-03 DIAGNOSIS — J9610 Chronic respiratory failure, unspecified whether with hypoxia or hypercapnia: Secondary | ICD-10-CM | POA: Diagnosis not present

## 2018-04-03 DIAGNOSIS — I2721 Secondary pulmonary arterial hypertension: Secondary | ICD-10-CM | POA: Diagnosis not present

## 2018-04-03 DIAGNOSIS — J439 Emphysema, unspecified: Secondary | ICD-10-CM | POA: Diagnosis not present

## 2018-04-07 IMAGING — DX DG CHEST 1V PORT
1 series · 1 of 1 positions shown · non-contrast
Comparison: Chest radiograph December 09, 2016 at 9096 hours

CLINICAL DATA: Line placement.

EXAM:
PORTABLE CHEST 1 VIEW

[chest ap]
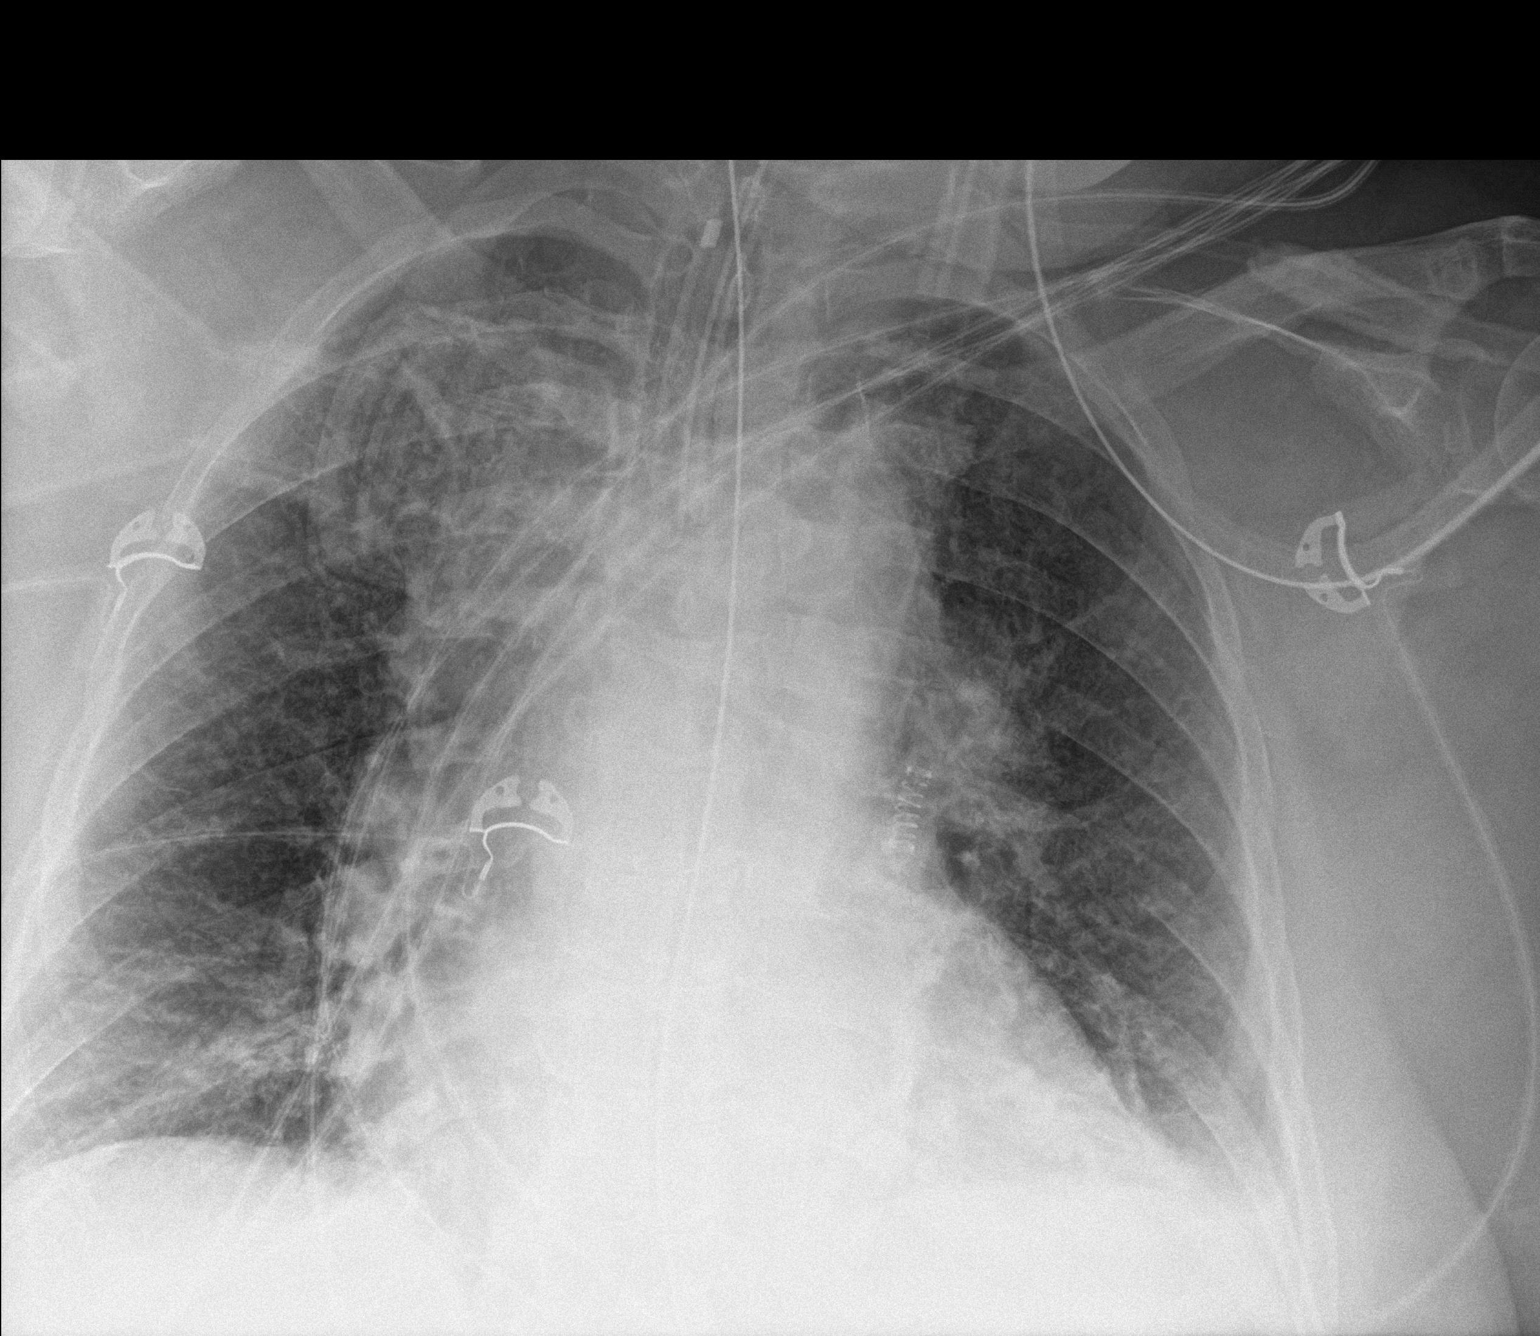

[1 of 1 positions shown; findings below may reference images not displayed]

FINDINGS: Endotracheal tube tip projects 2.8 cm above the carina. Nasogastric
tube past GE junction, out of field-of-view. Stable cardiomegaly,
pulmonary vascular congestion and interstitial prominence. Small
LEFT pleural effusion, RIGHT costophrenic angle out of
field-of-view. No pneumothorax. Soft tissue planes and included
osseous structures are unchanged.
IMPRESSION: Endotracheal tube tip projects 2.8 cm above the carina. Nasogastric
tube past GE junction.

Stable cardiomegaly, interstitial edema with small LEFT pleural
effusion.

## 2018-04-07 IMAGING — MR MR HEAD W/O CM
9 of 10 series · 35 of 48 positions shown · non-contrast
Comparison: CT HEAD December 09, 2017 and MRI of the head Thursday February, 2007

CLINICAL DATA: LEFT-sided weakness. Found hypoxic, off oxygen
December 09, 2017. History of COPD.

EXAM:
MRI HEAD WITHOUT CONTRAST
TECHNIQUE: Multiplanar, multiecho pulse sequences of the brain and surrounding
structures were obtained without intravenous contrast.

[Series 3: DWI · axial · 3.0mm · 0.94mm/px · z∈[-66,+76]mm · 9 of 100 slices shown (1 of 2)]
[im 1/100]
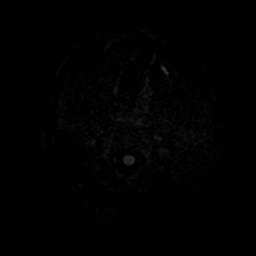
[im 13/100]
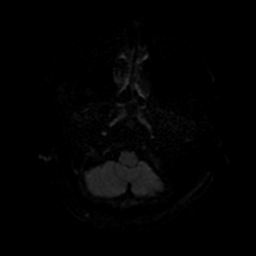
[im 25/100]
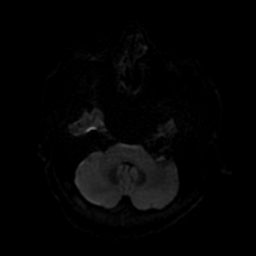
[im 38/100]
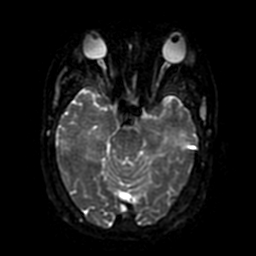
[im 50/100]
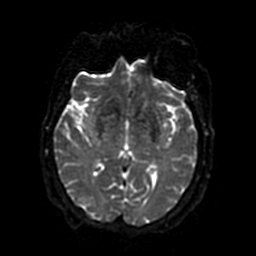
[im 62/100]
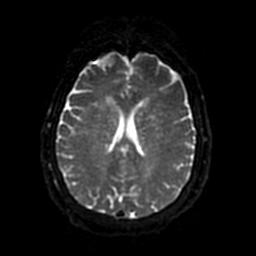
[im 75/100]
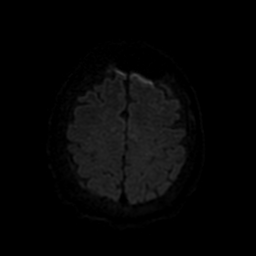
[im 87/100]
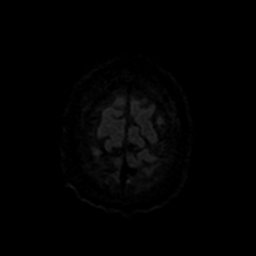
[im 100/100]
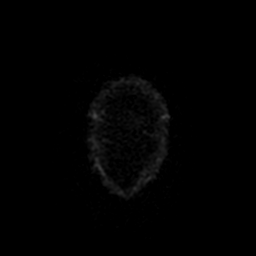

[Series 4: FLAIR · sagittal · 5.0mm · 0.47mm/px · 2 of 25 slices shown (1 of 2)]
[im 1/25]
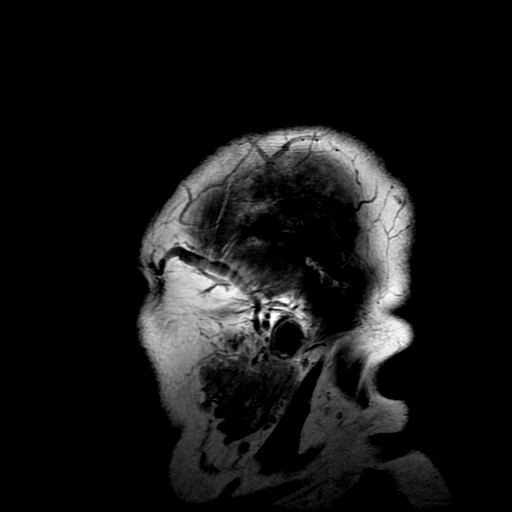
[im 25/25]
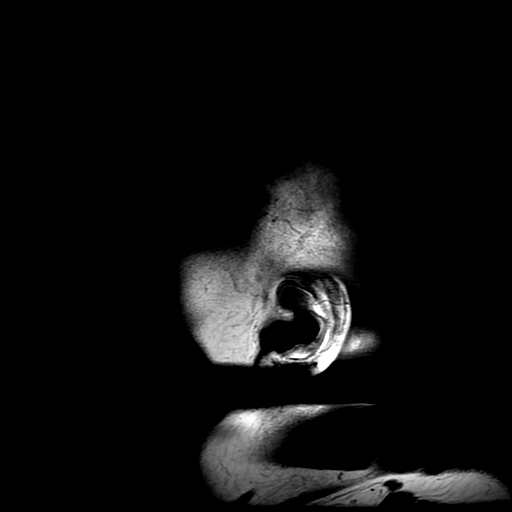

[Series 5: DWI · coronal · 4.0mm · 0.94mm/px · 7 of 72 slices shown (2 of 2)]
[im 1/72]
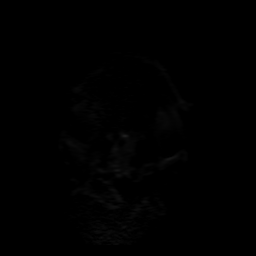
[im 12/72]
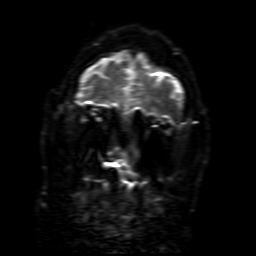
[im 24/72]
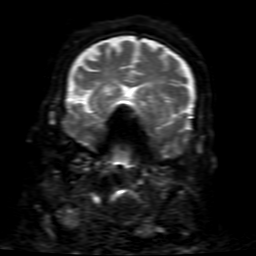
[im 36/72]
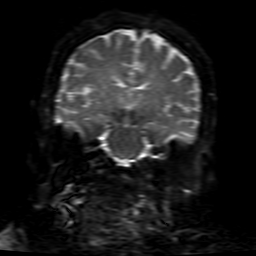
[im 48/72]
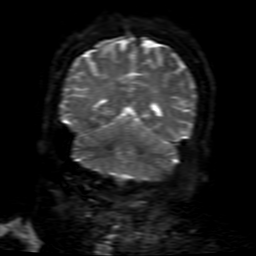
[im 60/72]
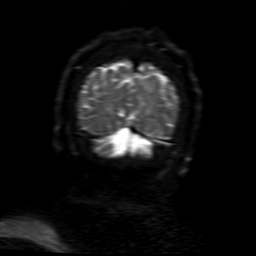
[im 72/72]
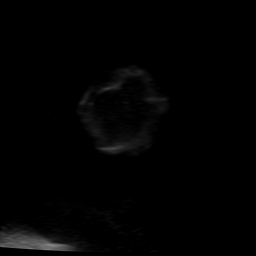

[Series 7: T2 · axial · 5.0mm · 0.47mm/px · z∈[-67,+80]mm · 2 of 26 slices shown (1 of 2)]
[im 1/26]
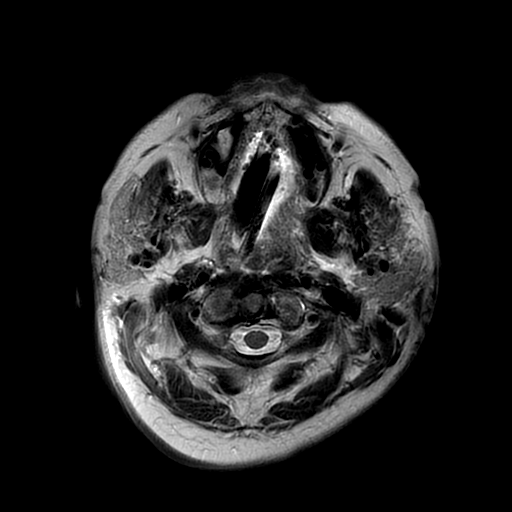
[im 26/26]
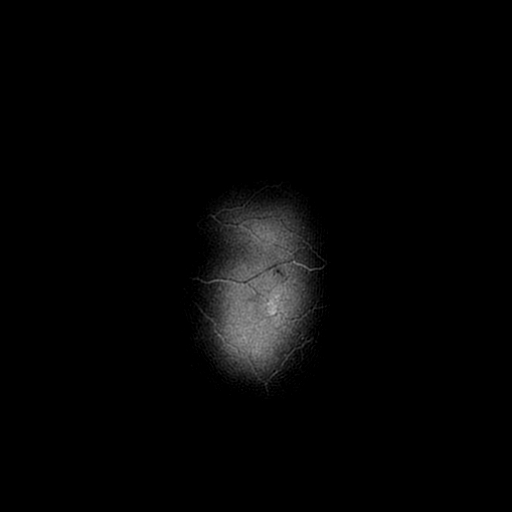

[Series 8: FLAIR · axial · 3.0mm · 0.47mm/px · z∈[-67,+80]mm · 2 of 26 slices shown (2 of 2)]
[im 1/26]
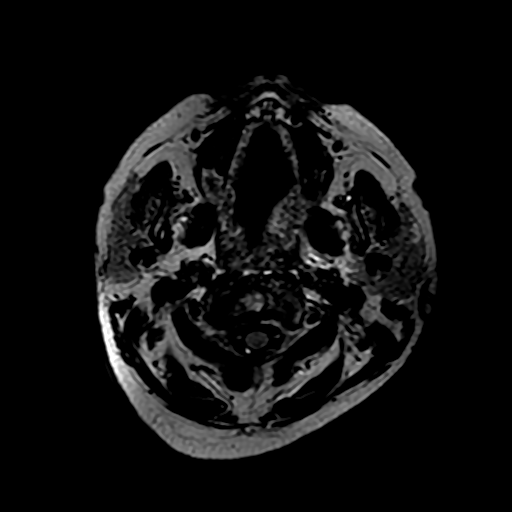
[im 26/26]
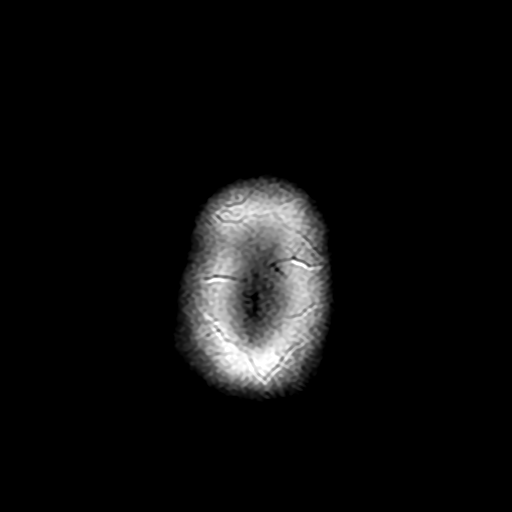

[Series 9: (person_name) · axial · 3.0mm · 0.47mm/px · z∈[-69,-53]mm · 2 of 104 slices shown]
[im 1/104]
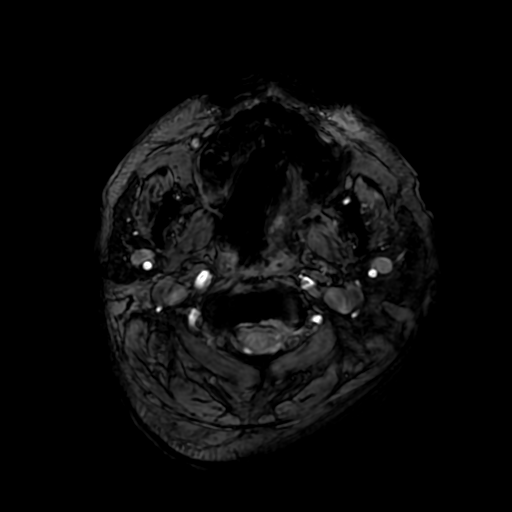
[im 12/104]
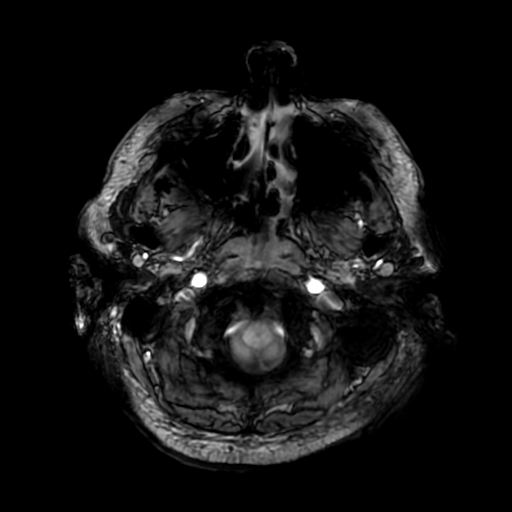

[Series 11: T2 · coronal · 5.0mm · 0.39mm/px · 3 of 30 slices shown (2 of 2)]
[im 1/30]
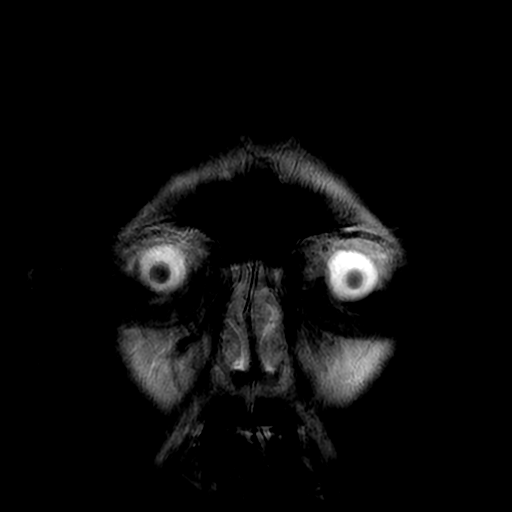
[im 15/30]
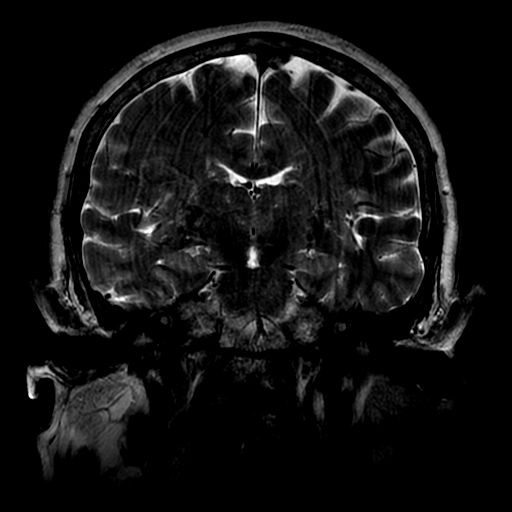
[im 30/30]
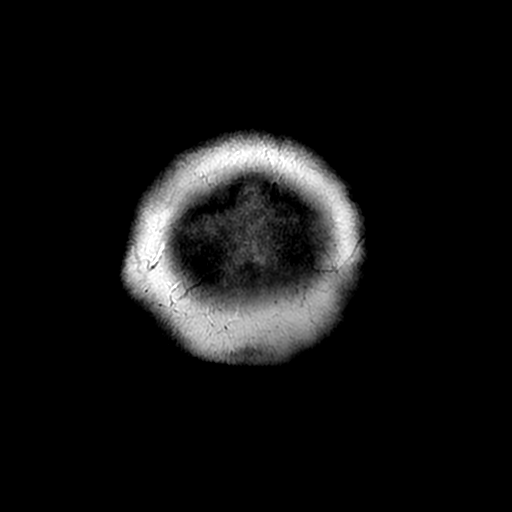

[Series 350: ADC · axial · 3.0mm · 0.94mm/px · z∈[-66,+76]mm · 5 of 50 slices shown (1 of 2)]
[im 1/50]
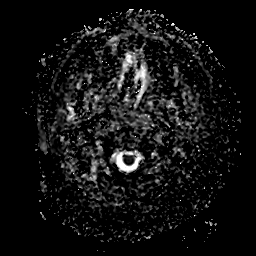
[im 13/50]
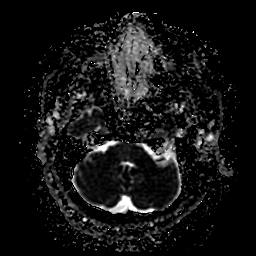
[im 25/50]
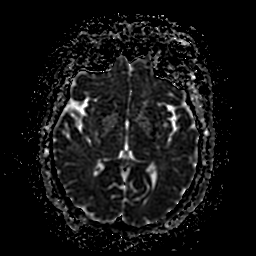
[im 37/50]
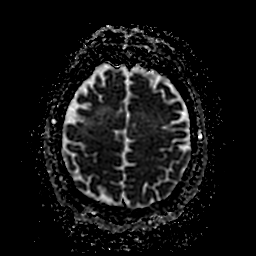
[im 50/50]
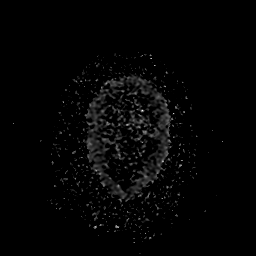

[Series 550: ADC · coronal · 4.0mm · 0.94mm/px · 3 of 36 slices shown (2 of 2)]
[im 1/36]
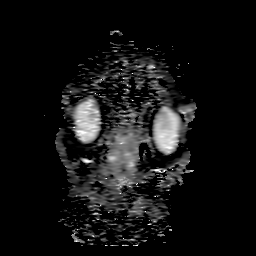
[im 18/36]
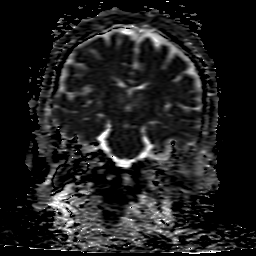
[im 36/36]
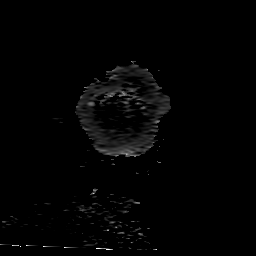

[35 of 48 positions shown; findings below may reference images not displayed]

FINDINGS: Mild motion degraded examination.

INTRACRANIAL CONTENTS: No reduced diffusion to suggest acute
ischemia. No susceptibility artifact to suggest hemorrhage. The
ventricles and sulci are normal for patient's age. Greater than
expected T2 hyperintense signal perivascular spaces basal ganglia
and pons associated with chronic small vessel ischemic disease.
Confluent T2 hyperintense signal bilateral globus pallidus eye.
Similar T2 bright signal bilateral globus pallidus. Patchy
supratentorial white matter FLAIR T2 hyperintensities, increased
from prior imaging. Faint linear T2 hyperintense signal through
dorsal pons. No suspicious parenchymal signal, masses, mass effect.
No abnormal extra-axial fluid collections. No extra-axial masses.

VASCULAR: Normal major intracranial vascular flow voids present at
skull base.

SKULL AND UPPER CERVICAL SPINE: No abnormal sellar expansion. No
suspicious calvarial bone marrow signal. Craniocervical junction
maintained.

SINUSES/ORBITS: Mild paranasal sinus mucosal thickening.The included
ocular globes and orbital contents are non-suspicious.

OTHER: Life-support lines in place.
IMPRESSION: 1. No acute intracranial process.
2. Similar T2 bright signal bilateral globus pallidus seen with
prior metabolic or toxic insult, less likely PKAN.
3. Moderate chronic small vessel ischemic disease.
4. Mildly progressive pontine signal abnormality associated with
neurodegenerative syndromes, however there are no corroborated
changes. Finding could reflect small vessel ischemic changes.

## 2018-04-10 ENCOUNTER — Encounter: Payer: Self-pay | Admitting: Obstetrics and Gynecology

## 2018-04-13 ENCOUNTER — Telehealth: Payer: Self-pay | Admitting: Obstetrics and Gynecology

## 2018-04-13 ENCOUNTER — Ambulatory Visit (INDEPENDENT_AMBULATORY_CARE_PROVIDER_SITE_OTHER): Payer: 59 | Admitting: Obstetrics and Gynecology

## 2018-04-13 ENCOUNTER — Encounter: Payer: Self-pay | Admitting: Obstetrics and Gynecology

## 2018-04-13 VITALS — BP 110/71 | HR 91 | Ht 62.0 in | Wt 236.2 lb

## 2018-04-13 DIAGNOSIS — R87610 Atypical squamous cells of undetermined significance on cytologic smear of cervix (ASC-US): Secondary | ICD-10-CM | POA: Diagnosis not present

## 2018-04-13 DIAGNOSIS — Z01419 Encounter for gynecological examination (general) (routine) without abnormal findings: Secondary | ICD-10-CM | POA: Diagnosis not present

## 2018-04-13 DIAGNOSIS — K429 Umbilical hernia without obstruction or gangrene: Secondary | ICD-10-CM

## 2018-04-13 NOTE — Progress Notes (Addendum)
HPI:      [name] is a 62 y.o. [id number] who LMP was No LMP recorded. (Menstrual status: Other).  Subjective:   She presents today for her annual examination.  She is followed by a pulmonologist a cardiologist and internist.  She presents today for breast exam and pelvic exam.  Significant note patient has had abnormal Pap smears in the past revealing positive HPV but negative cytology. She continues to smoke cigarettes.    Hx: The following portions of the patient's history were reviewed and updated as appropriate:             She  has a past medical history of Acute postoperative pain (10/03/2017), Anxiety, Carpal tunnel syndrome, CHF (congestive heart failure) ([place]), CHF (congestive heart failure) ([place]), Chronic generalized abdominal pain, Chronic rhinitis, COPD (chronic obstructive pulmonary disease) ([place]), DDD (degenerative disc disease), cervical, Depression, Edema, Flu, Gastritis, Hematuria, Hypertension, Kidney stones, Low back pain, Lumbar radiculitis, Malodorous urine, Muscle weakness, Obesity, Renal cyst, Sensory urge incontinence, and Tobacco abuse. She does not have any pertinent problems on file. She  has a past surgical history that includes Abdominal hysterectomy; Tonsillectomy; Carpal tunnel release (Left); and Tubal ligation. Her family history includes Alcohol abuse in her father; Cancer in her brother, brother, and sister; Coronary artery disease in her mother; Heart disease in her father and mother; Lung cancer in her sister; Pneumonia in her brother; Stroke in her mother. She  reports that she has been smoking cigarettes.  She has a 22.00 pack-year smoking history. She has never used smokeless tobacco. She reports that she does not drink alcohol or use drugs. She has a current medication list which includes the following prescription(s): advair diskus, alprazolam, aspirin, bisoprolol-hydrochlorothiazide, cholecalciferol, daliresp, furosemide, gabapentin,  hydrocodone-acetaminophen, ipratropium-albuterol, levothyroxine, oxygen-helium, potassium chloride sa, ranitidine, sertraline, and ventolin hfa. She is allergic to codeine and meloxicam.       Review of Systems:  Review of Systems  Constitutional: Denied constitutional symptoms, night sweats, recent illness, fatigue, fever, insomnia and weight loss.  Eyes: Denied eye symptoms, eye pain, photophobia, vision change and visual disturbance.  Ears/Nose/Throat/Neck: Denied ear, nose, throat or neck symptoms, hearing loss, nasal discharge, sinus congestion and sore throat.  Cardiovascular: Denied cardiovascular symptoms, arrhythmia, chest pain/pressure, edema, exercise intolerance, orthopnea and palpitations.  Respiratory: Denied pulmonary symptoms, asthma, pleuritic pain, productive sputum, cough, dyspnea and wheezing.  Gastrointestinal: Denied, gastro-esophageal reflux, melena, nausea and vomiting.  Genitourinary: Denied genitourinary symptoms including symptomatic vaginal discharge, pelvic relaxation issues, and urinary complaints.  Musculoskeletal: Denied musculoskeletal symptoms, stiffness, swelling, muscle weakness and myalgia.  Dermatologic: Denied dermatology symptoms, rash and scar.  Neurologic: Denied neurology symptoms, dizziness, headache, neck pain and syncope.  Psychiatric: Denied psychiatric symptoms, anxiety and depression.  Endocrine: Denied endocrine symptoms including hot flashes and night sweats.   Meds:   Current Outpatient Medications on File Prior to Visit  Medication Sig Dispense Refill  . ADVAIR DISKUS 250-50 MCG/DOSE AEPB Inhale 1 puff into the lungs 2 (two) times daily.  3  . ALPRAZolam (XANAX) 0.5 MG tablet Take 1 tablet (0.5 mg total) by mouth 2 (two) times daily as needed for anxiety. 60 tablet 0  . aspirin 81 MG tablet Take 81 mg by mouth daily.    . bisoprolol-hydrochlorothiazide (ZIAC) 10-6.25 MG tablet Take 1 tablet by mouth daily.    . Cholecalciferol ([employer] D3)  2000 units CAPS Take 1 capsule by mouth daily.    Marland Kitchen DALIRESP 500 MCG TABS tablet Take 500 mcg by  mouth daily.    . furosemide (LASIX) 40 MG tablet Take 1 tablet (40 mg total) by mouth 2 (two) times daily. 180 tablet 3  . gabapentin (NEURONTIN) 100 MG capsule Take 1-3 capsules (100-300 mg total) by mouth at bedtime. Follow written titration schedule. 90 capsule 2  . [START ON 04/21/2018] HYDROcodone-acetaminophen (NORCO/VICODIN) 5-325 MG tablet Take 1 tablet by mouth every 8 (eight) hours as needed for severe pain. 90 tablet 0  . ipratropium-albuterol (DUONEB) 0.5-2.5 (3) MG/3ML SOLN Inhale 3 mLs into the lungs every 6 (six) hours as needed for shortness of breath.  3  . levothyroxine (SYNTHROID, LEVOTHROID) 25 MCG tablet Take 1 tablet (25 mcg total) by mouth daily before breakfast. 30 tablet 3  . OXYGEN Inhale 2.5 L into the lungs continuous. Reported on 11/03/2015    . potassium chloride SA (K-DUR,KLOR-CON) 20 MEQ tablet Take 20 mEq by mouth 2 (two) times daily.    . ranitidine (ZANTAC) 150 MG tablet Take 150 mg by mouth 2 (two) times daily.    . sertraline (ZOLOFT) 100 MG tablet Take 2 tablets (200 mg total) by mouth every morning. 60 tablet 3  . VENTOLIN HFA 108 (90 Base) MCG/ACT inhaler Inhale 2 puffs into the lungs every 6 (six) hours as needed for shortness of breath. 1 Inhaler 5   No current facility-administered medications on file prior to visit.     Objective:     Vitals:   04/13/18 0924  BP: 110/71  Pulse: 91              Physical examination General NAD, Conversant  HEENT Atraumatic; Op clear with mmm.  Normo-cephalic. Pupils reactive. Anicteric sclerae  Thyroid/Neck Smooth without nodularity or enlargement. Normal ROM.  Neck Supple.  Skin No rashes, lesions or ulceration. Normal palpated skin turgor. No nodularity.  Breasts: No masses or discharge.  Symmetric.  No axillary adenopathy.  Abdomen: Soft.  Non-tender.  No masses.  No HSM.  Umbilical hernia present  Extremities:  Moves all appropriately.  Normal ROM for age. No lymphadenopathy.  Neuro: Oriented to PPT.  Normal mood. Normal affect.     Pelvic:   Vulva: Normal appearance.  No lesions.  Vagina: No lesions or abnormalities noted.  Support: Normal pelvic support.  Urethra No masses tenderness or scarring.  Meatus Normal size without lesions or prolapse.  Cervix: Normal appearance.  No lesions.  Anus: Normal exam.  No lesions.  Perineum: Normal exam.  No lesions.        Bimanual   Uterus:  Top normal size.  non-tender.  Mobile.  AV.  Adnexae: No masses.  Non-tender to palpation.  Cul-de-sac: Negative for abnormality.      Assessment:    [id number] Patient Active Problem List   Diagnosis Date Noted  . Chronic lower extremity pain (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) 01/25/2018  . Altered mental status, unspecified 12/09/2017  . Chronic hip pain [facility] Area of Pain) (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) 11/27/2017  . COPD (chronic obstructive pulmonary disease) ([place]) 08/11/2017  . Other long term (current) drug therapy 08/08/2017  . Disorder of bone, unspecified 08/08/2017  . COPD with hypoxia ([place]) 06/27/2017  . Arthralgia of acromioclavicular joint (Right) 06/15/2017  . Acromioclavicular joint DJD (Right) 06/15/2017  . 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  . Chronic shoulder pain (Left) 05/11/2017  . Elevated brain natriuretic peptide (BNP) level 05/11/2017  . Grief at loss of child 02/14/2017  . Lumbar facet hypertrophy (multilevel) 11/17/2016  . Lumbar spinal stenosis (L4-5) 11/17/2016  . Lumbar  foraminal stenosis (L4-5) (Left) 11/17/2016  . 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 Lumbosacral radiculopathy at L4 11/17/2016  . Supplemental oxygen dependent 08/01/2016  . Sleep apnea 06/02/2016  . Long term current use of opiate analgesic 01/12/2016  . Long term prescription opiate use 01/12/2016  . Opiate use (15 MME/Day) 01/12/2016  . Lumbar facet syndrome (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)  01/12/2016  . Chronic sacroiliac joint pain (Bilateral) 01/12/2016  . Vitamin D deficiency 12/30/2015  . 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 Obesity, Class III, BMI 40-49.9 (morbid obesity) ([place]) 12/28/2015  . 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[place]) 11/19/2015  . Coagulation disorder ([place]) 10/22/2015  . Chronic pain syndrome (significant psychosocial component) 09/21/2015  . 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 09/21/2015  . Encounter for therapeutic drug level monitoring 09/16/2015  . Encounter for chronic pain management 09/16/2015  . Pain management 09/16/2015  . Neurogenic pain 09/16/2015  . Neuropathic pain 09/16/2015  . Musculoskeletal pain 09/16/2015  . Lumbar spondylosis (L4-5) 09/16/2015  . Chronic low back pain (Primary Area of Pain) (Left) 09/16/2015  . Chronic lower extremity pain (Secondary Area of Pain) (Left) 09/16/2015  . Chronic lumbar radicular pain (L4 & S1 Dermatome) (Left) 09/16/2015  . Chronic shoulder pain (Right side) 09/16/2015  . Chronic upper extremity pain (Right-sided) 09/16/2015  . Cervical radiculitis (Right side) 09/16/2015  . Abnormal x-ray of lumbar spine 09/16/2015  . Chronic diastolic heart failure ([place]) 07/15/2015  . Chest pain 07/15/2015  . Tobacco abuse 07/15/2015  . Blood clotting disorder ([place]) 04/21/2015  . Decreased motor strength 07/16/2014  . Clinical depression 07/16/2014  . Chronic rhinitis 07/16/2014  . Carpal tunnel syndrome 07/16/2014  . Anxiety state 07/16/2014  . Neuritis or radiculitis due to rupture of lumbar intervertebral disc 07/02/2014  . DDD (degenerative disc disease), lumbar 07/02/2014  . CAFL (chronic airflow limitation) ([place]) 02/27/2014  . Essential (primary) hypertension 03/27/2008     1. Encounter for well woman exam with routine gynecological exam   2. Umbilical hernia without obstruction and without gangrene     Patient with multiple medical problems followed  separately.  Pelvic and breast exam normal.   Plan:            1.  Pap performed- co-test.  2.  Mammogram ordered  3.  Patient again advised to discontinue tobacco use. Orders Orders Placed This Encounter  Procedures  . MM DIGITAL SCREENING BILATERAL    No orders of the defined types were placed in this encounter.       F/U  Return in about 1 year (around 04/14/2019) for Annual Physical.  Finis Bud, M.D. 04/16/2018 11:09 AM

## 2018-04-13 NOTE — Telephone Encounter (Signed)
Will send to [facility]. Do not see in his note about referring pt.

## 2018-04-13 NOTE — Telephone Encounter (Signed)
The patient called and left a VM at 11:27 stating she needs a surgical referral to [facility] per Dr. Amalia Hailey at today's visit.  I checked but do not see a referral in the system yet.  Her good call back number is [phone], please advise, thanks.

## 2018-04-13 NOTE — Progress Notes (Signed)
Pt is present today for her annual exam. Pt stated that she is doing well.

## 2018-04-16 NOTE — Telephone Encounter (Signed)
Pt aware per vm.

## 2018-04-16 NOTE — Addendum Note (Signed)
Addended by: Finis Bud on: 04/16/2018 11:09 AM   Modules accepted: Orders

## 2018-04-17 ENCOUNTER — Encounter: Payer: Self-pay | Admitting: General Surgery

## 2018-04-20 ENCOUNTER — Other Ambulatory Visit: Payer: Self-pay | Admitting: Family Medicine

## 2018-04-20 MED ORDER — SERTRALINE HCL 100 MG PO TABS: 200.0000 mg | ORAL_TABLET | ORAL | 2 refills | Status: DC

## 2018-04-20 NOTE — Telephone Encounter (Signed)
[facility] pharmacy faxed a refill request for the following medication. Thanks CC  sertraline (ZOLOFT) 100 MG tablet

## 2018-04-21 DIAGNOSIS — J9610 Chronic respiratory failure, unspecified whether with hypoxia or hypercapnia: Secondary | ICD-10-CM | POA: Diagnosis not present

## 2018-04-23 ENCOUNTER — Other Ambulatory Visit: Payer: Self-pay

## 2018-04-23 LAB — PAP IG, CT-NG NAA, HPV HIGH-RISK
Chlamydia, Nuc. Acid Amp: NEGATIVE
Gonococcus by Nucleic Acid Amp: NEGATIVE
PAP Smear Comment: 0

## 2018-04-23 LAB — HPV, LOW VOLUME (REFLEX): HPV low volume reflex: POSITIVE — AB

## 2018-04-23 MED ORDER — BISOPROLOL-HYDROCHLOROTHIAZIDE 10-6.25 MG PO TABS: 1.0000 | ORAL_TABLET | Freq: Every day | ORAL | 2 refills | Status: DC

## 2018-04-23 MED ORDER — LEVOTHYROXINE SODIUM 25 MCG PO TABS: 25.0000 ug | ORAL_TABLET | Freq: Every day | ORAL | 2 refills | Status: DC

## 2018-04-23 NOTE — Telephone Encounter (Signed)
Refill request received from [facility] requesting Bisoprolol-HCTZ, Levothyroxine and Clotrimazole 10 mg [place].

## 2018-04-26 ENCOUNTER — Other Ambulatory Visit: Payer: Self-pay

## 2018-04-26 MED ORDER — POTASSIUM CHLORIDE CRYS ER 20 MEQ PO TBCR
20.0000 meq | EXTENDED_RELEASE_TABLET | Freq: Two times a day (BID) | ORAL | 3 refills | Status: DC
Start: 2018-04-26 — End: 2018-08-31

## 2018-04-26 MED ORDER — FUROSEMIDE 40 MG PO TABS: 40.0000 mg | ORAL_TABLET | Freq: Two times a day (BID) | ORAL | 3 refills | Status: DC

## 2018-04-30 DIAGNOSIS — J9610 Chronic respiratory failure, unspecified whether with hypoxia or hypercapnia: Secondary | ICD-10-CM | POA: Diagnosis not present

## 2018-05-01 ENCOUNTER — Other Ambulatory Visit: Payer: Self-pay | Admitting: Family Medicine

## 2018-05-01 NOTE — Telephone Encounter (Signed)
[facility] faxed a refill request for the following medication. Thanks CC  bisoprolol-hydrochlorothiazide (ZIAC) 10-6.25 MG tablet    levothyroxine (SYNTHROID, LEVOTHROID) 25 MCG tablet   Clotrimazole 10 mg [place]

## 2018-05-02 ENCOUNTER — Ambulatory Visit: Payer: Medicare HMO | Attending: Nurse Practitioner | Admitting: Nurse Practitioner

## 2018-05-02 ENCOUNTER — Other Ambulatory Visit: Payer: Self-pay

## 2018-05-02 ENCOUNTER — Encounter: Payer: Self-pay | Admitting: Nurse Practitioner

## 2018-05-02 VITALS — BP 107/62 | HR 72 | Temp 98.0°F | Ht 62.0 in | Wt 236.0 lb

## 2018-05-02 DIAGNOSIS — Z885 Allergy status to narcotic agent status: Secondary | ICD-10-CM | POA: Diagnosis not present

## 2018-05-02 DIAGNOSIS — Z79899 Other long term (current) drug therapy: Secondary | ICD-10-CM | POA: Insufficient documentation

## 2018-05-02 DIAGNOSIS — G4730 Sleep apnea, unspecified: Secondary | ICD-10-CM | POA: Insufficient documentation

## 2018-05-02 DIAGNOSIS — G894 Chronic pain syndrome: Secondary | ICD-10-CM | POA: Diagnosis not present

## 2018-05-02 DIAGNOSIS — Z7982 Long term (current) use of aspirin: Secondary | ICD-10-CM | POA: Diagnosis not present

## 2018-05-02 DIAGNOSIS — M545 Low back pain: Secondary | ICD-10-CM | POA: Diagnosis not present

## 2018-05-02 DIAGNOSIS — E559 Vitamin D deficiency, unspecified: Secondary | ICD-10-CM | POA: Diagnosis not present

## 2018-05-02 DIAGNOSIS — M4726 Other spondylosis with radiculopathy, lumbar region: Secondary | ICD-10-CM | POA: Insufficient documentation

## 2018-05-02 DIAGNOSIS — M7918 Myalgia, other site: Secondary | ICD-10-CM | POA: Diagnosis not present

## 2018-05-02 DIAGNOSIS — Z9981 Dependence on supplemental oxygen: Secondary | ICD-10-CM | POA: Diagnosis not present

## 2018-05-02 DIAGNOSIS — I5032 Chronic diastolic (congestive) heart failure: Secondary | ICD-10-CM | POA: Diagnosis not present

## 2018-05-02 DIAGNOSIS — F329 Major depressive disorder, single episode, unspecified: Secondary | ICD-10-CM | POA: Insufficient documentation

## 2018-05-02 DIAGNOSIS — M25552 Pain in left hip: Secondary | ICD-10-CM

## 2018-05-02 DIAGNOSIS — M5412 Radiculopathy, cervical region: Secondary | ICD-10-CM | POA: Insufficient documentation

## 2018-05-02 DIAGNOSIS — Z87442 Personal history of urinary calculi: Secondary | ICD-10-CM | POA: Diagnosis not present

## 2018-05-02 DIAGNOSIS — Z888 Allergy status to other drugs, medicaments and biological substances status: Secondary | ICD-10-CM | POA: Diagnosis not present

## 2018-05-02 DIAGNOSIS — M25511 Pain in right shoulder: Secondary | ICD-10-CM | POA: Insufficient documentation

## 2018-05-02 DIAGNOSIS — M48061 Spinal stenosis, lumbar region without neurogenic claudication: Secondary | ICD-10-CM | POA: Insufficient documentation

## 2018-05-02 DIAGNOSIS — N281 Cyst of kidney, acquired: Secondary | ICD-10-CM | POA: Insufficient documentation

## 2018-05-02 DIAGNOSIS — R079 Chest pain, unspecified: Secondary | ICD-10-CM | POA: Insufficient documentation

## 2018-05-02 DIAGNOSIS — M5116 Intervertebral disc disorders with radiculopathy, lumbar region: Secondary | ICD-10-CM | POA: Insufficient documentation

## 2018-05-02 DIAGNOSIS — K2970 Gastritis, unspecified, without bleeding: Secondary | ICD-10-CM | POA: Insufficient documentation

## 2018-05-02 DIAGNOSIS — M1610 Unilateral primary osteoarthritis, unspecified hip: Secondary | ICD-10-CM | POA: Diagnosis not present

## 2018-05-02 DIAGNOSIS — M47816 Spondylosis without myelopathy or radiculopathy, lumbar region: Secondary | ICD-10-CM

## 2018-05-02 DIAGNOSIS — G8918 Other acute postprocedural pain: Secondary | ICD-10-CM | POA: Insufficient documentation

## 2018-05-02 DIAGNOSIS — R82998 Other abnormal findings in urine: Secondary | ICD-10-CM | POA: Insufficient documentation

## 2018-05-02 DIAGNOSIS — M25551 Pain in right hip: Secondary | ICD-10-CM

## 2018-05-02 DIAGNOSIS — G8929 Other chronic pain: Secondary | ICD-10-CM

## 2018-05-02 DIAGNOSIS — G5603 Carpal tunnel syndrome, bilateral upper limbs: Secondary | ICD-10-CM | POA: Insufficient documentation

## 2018-05-02 DIAGNOSIS — J449 Chronic obstructive pulmonary disease, unspecified: Secondary | ICD-10-CM | POA: Diagnosis not present

## 2018-05-02 MED ORDER — BISOPROLOL-HYDROCHLOROTHIAZIDE 10-6.25 MG PO TABS: 1.0000 | ORAL_TABLET | Freq: Every day | ORAL | 0 refills | Status: DC

## 2018-05-02 MED ORDER — HYDROCODONE-ACETAMINOPHEN 5-325 MG PO TABS: 1.0000 | ORAL_TABLET | Freq: Three times a day (TID) | ORAL | 0 refills | Status: DC | PRN

## 2018-05-02 MED ORDER — GABAPENTIN 300 MG PO CAPS: 300.0000 mg | ORAL_CAPSULE | Freq: Four times a day (QID) | ORAL | 2 refills | Status: DC

## 2018-05-02 MED ORDER — LEVOTHYROXINE SODIUM 25 MCG PO TABS: 25.0000 ug | ORAL_TABLET | Freq: Every day | ORAL | 2 refills | Status: DC

## 2018-05-02 NOTE — Progress Notes (Signed)
Patient's Name: Theresa [name]  MRN: [id number]  Referring Provider: Margo Common, [place]  DOB: 1956-05-19  PCP: Margo Common, PA  DOS: 05/02/2018  Note by: Vevelyn Francois NP  Service setting: Ambulatory outpatient  Specialty: Interventional Pain Management  Location: [facility] (AMB) Pain Management Facility    Patient type: Established    Primary Reason(s) for Visit: Encounter for prescription drug management. (Level of risk: moderate)  CC: Back Pain (low)  HPI  Theresa [name] is a 62 y.o. year old, female patient, who comes today for a medication management evaluation. She has Neuritis or radiculitis due to rupture of lumbar intervertebral disc; Decreased motor strength; Essential (primary) hypertension; Clinical depression; DDD (degenerative disc disease), lumbar; CAFL (chronic airflow limitation) ([place]); Chronic rhinitis; Carpal tunnel syndrome; Blood clotting disorder ([place]); Anxiety state; Chronic diastolic heart failure ([place]); Chest pain; Tobacco abuse; Encounter for therapeutic drug level monitoring; Encounter for chronic pain management; Pain management; Neurogenic pain; Neuropathic pain; Musculoskeletal pain; Lumbar spondylosis (L4-5); Chronic low back pain (Primary Area of Pain) (Left); Chronic lower extremity pain (Secondary Area of Pain) (Left); Chronic lumbar radicular pain (L4 & S1 Dermatome) (Left); Chronic shoulder pain (Right side); Chronic upper extremity pain (Right-sided); Cervical radiculitis (Right side); Abnormal x-ray of lumbar spine; Chronic pain syndrome (significant psychosocial component); 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; Coagulation disorder ([place]); 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); Obesity, Class III, BMI 40-49.9 (morbid obesity) ([place]); 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[place]); Vitamin D deficiency; Long term current use of opiate analgesic; Long term prescription opiate use; Opiate use (15 MME/Day); Lumbar facet syndrome (Bilateral)  (L>R); Chronic sacroiliac joint pain (Bilateral); Sleep apnea; Supplemental oxygen dependent; Lumbar facet hypertrophy (multilevel); Lumbar spinal stenosis (L4-5); 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); 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 Her primarily concern today is the Back Pain (low)  Pain Assessment: Location: Lower Back Radiating: radiates into both hips  Onset: More than a month ago Quality: Throbbing, Aching Severity: 6 /10 (subjective, self-reported pain score)  Note: Reported level is compatible with observation. Clinically the patient looks like a 2/10 A 2/10 is viewed as "Mild to Moderate" and described as noticeable and distracting. Impossible to hide from other people. More frequent flare-ups. Still possible to adapt and function close to normal. It can be very annoying and may have occasional stronger flare-ups. With discipline, patients may get used to it and adapt. Information on the proper use of the pain scale provided to the patient today. When using our objective Pain Scale, levels between 6 and 10/10 are said to belong in an emergency room, as it progressively worsens from a 6/10, described as severely limiting, requiring emergency care not usually available at an outpatient pain management facility. At a 6/10 level, communication becomes difficult and requires great effort. Assistance to reach  the emergency department may be required. Facial flushing and profuse sweating along with potentially dangerous increases in heart rate and blood pressure will be evident. Effect on ADL: "Somedays I cant do anything" Timing: Intermittent Modifying factors: medications,  BP: 107/62  HR: 72  Theresa [name] was last scheduled for an appointment on 01/31/2018 for medication management. During today's appointment we reviewed Theresa [name]'s chronic pain status, as well as her outpatient medication regimen. She admits that her pain is getting worse. She admits that her last LESI did not last more that one day .  She denies any numbness or tingling.   The patient  reports that she does not use drugs. Her body mass index is 43.16 kg/m.  Further details on both, my assessment(s), as well as the proposed treatment plan, please see below.  Controlled Substance Pharmacotherapy Assessment REMS (Risk Evaluation and Mitigation Strategy)  Analgesic:Hydrocodone/APAP 5/325 one tablet every 8 hours (15 mg/day of hydrocodone) MME/day:15 mg/day  Dewayne Shorter, RN  05/02/2018 11:26 AM  Signed Nursing Pain Medication Assessment:  Safety precautions to be maintained throughout the outpatient stay will include: orient to surroundings, keep bed in low position, maintain call bell within reach at all times, provide assistance with transfer out of bed and ambulation.  Medication Inspection Compliance: Pill count conducted under aseptic conditions, in front of the patient. Neither the pills nor the bottle was removed from the patient's sight at any time. Once count was completed pills were immediately returned to the patient in their original bottle.  Medication: Hydrocodone/APAP Pill/Patch Count: 49 of 90 pills remain Pill/Patch Appearance: Markings consistent with prescribed medication Bottle Appearance: Standard pharmacy container. Clearly labeled. Filled Date:07/06/ 2019 Last Medication intake:  Today    Pharmacokinetics: Liberation and absorption (onset of action): WNL Distribution (time to peak effect): WNL Metabolism and excretion (duration of action): WNL         Pharmacodynamics: Desired effects: Analgesia: Theresa [name] reports >50% benefit. Functional ability: Patient reports that medication allows her to accomplish basic ADLs Clinically meaningful improvement in function (CMIF): Sustained CMIF goals met Perceived effectiveness: Described as relatively effective, allowing for increase in activities of daily living (ADL) Undesirable effects: Side-effects or Adverse reactions: None reported Monitoring: [place] PMP: Online review of the past 15-monthperiod conducted. Compliant with practice rules and regulations Last UDS on record: Summary  Date Value Ref Range Status  11/08/2017 FINAL  Final    Comment:    ==================================================================== TOXASSURE SELECT 13 (MW) ==================================================================== Test                             Result       Flag       Units Drug Present and Declared for Prescription Verification   Hydrocodone                    679          EXPECTED   ng/mg creat   Norhydrocodone                 831          EXPECTED   ng/mg creat    Sources of hydrocodone include scheduled prescription    medications. Norhydrocodone is an expected metabolite of    hydrocodone. Drug Absent but Declared for Prescription Verification   Alprazolam                     Not Detected UNEXPECTED ng/mg creat ==================================================================== Test                      Result    Flag   Units  Ref Range   Creatinine              29               mg/dL      >=20 ==================================================================== Declared Medications:  The flagging and interpretation on this report are based on the  following declared medications.  Unexpected results may arise  from  inaccuracies in the declared medications.  **Note: The testing scope of this panel includes these medications:  Alprazolam  Hydrocodone (Norco)  **Note: The testing scope of this panel does not include following  reported medications:  Acetaminophen (Norco)  Albuterol (Duoneb)  Albuterol (Proventil)  Bisoprolol (Ziac)  Cholecalciferol  Fluticasone (Advair)  Furosemide (Lasix)  Gabapentin (Neurontin)  Hydrochlorothiazide (Ziac)  Ipratropium (Duoneb)  Ketoconazole (Nizoral)  Potassium (Klor-Con)  Ranitidine (Zantac)  Salicylate (Anacin)  Salmeterol (Advair)  Sertraline (Zoloft) ==================================================================== For clinical consultation, please call [phone]. ====================================================================    UDS interpretation: Compliant          Medication Assessment Form: Reviewed. Patient indicates being compliant with therapy Treatment compliance: Compliant Risk Assessment Profile: Aberrant behavior: See prior evaluations. None observed or detected today Comorbid factors increasing risk of overdose: See prior notes. No additional risks detected today Risk of substance use disorder (SUD): Low Opioid Risk Tool - 05/02/18 1123      Family History of Substance Abuse   Alcohol  Negative    Illegal Drugs  Negative    Rx Drugs  Negative      Personal History of Substance Abuse   Alcohol  Negative    Illegal Drugs  Negative    Rx Drugs  Negative      Age   Age between 64-45 years   No      Psychological Disease   Psychological Disease  Negative    Depression  Positive      Total Score   Opioid Risk Tool Scoring  1    Opioid Risk Interpretation  Low Risk      ORT Scoring interpretation table:  Score <3 = Low Risk for SUD  Score between 4-7 = Moderate Risk for SUD  Score >8 = High Risk for Opioid Abuse   Risk Mitigation Strategies:  Patient Counseling: Covered Patient-Prescriber Agreement  (PPA): Present and active  Notification to other healthcare providers: Done  Pharmacologic Plan: No change in therapy, at this time.             Laboratory Chemistry  Inflammation Markers (CRP: Acute Phase) (ESR: Chronic Phase) Lab Results  Component Value Date   CRP 9.1 (H) 08/08/2017   ESRSEDRATE 16 08/08/2017   LATICACIDVEN 0.9 12/10/2017                         Rheumatology Markers No results found for: RF, ANA, LABURIC, URICUR, LYMEIGGIGMAB, LYMEABIGMQN, HLAB27                      Renal Function Markers Lab Results  Component Value Date   BUN 12 03/01/2018   CREATININE 0.70 03/01/2018   BCR 17 03/01/2018   GFRAA 109 03/01/2018   GFRNONAA 94 03/01/2018                             Hepatic Function Markers Lab Results  Component Value Date   AST 18 03/01/2018   ALT 20 03/01/2018   ALBUMIN 4.0 03/01/2018  ALKPHOS 107 03/01/2018   LIPASE 31 10/17/2017   AMMONIA 31 12/09/2017                        Electrolytes Lab Results  Component Value Date   NA 143 03/01/2018   K 4.2 03/01/2018   CL 102 03/01/2018   CALCIUM 9.0 03/01/2018   MG 2.5 (H) 12/10/2017   PHOS 5.4 (H) 12/10/2017                        Neuropathy Markers Lab Results  Component Value Date   VITAMINB12 501 08/08/2017   HIV Non Reactive 12/10/2017                        Bone Pathology Markers Lab Results  Component Value Date   25OHVITD1 13 (L) 09/17/2015   25OHVITD2 <1.0 09/17/2015   25OHVITD3 13 09/17/2015                         Coagulation Parameters Lab Results  Component Value Date   INR 1.04 12/09/2017   LABPROT 13.6 12/09/2017   APTT 36 12/09/2017   PLT 178 03/01/2018                        Cardiovascular Markers Lab Results  Component Value Date   BNP 333.4 (H) 12/10/2017   CKTOTAL 72 12/27/2012   CKMB 1.0 12/27/2012   TROPONINI <0.03 02/07/2018   HGB 13.7 03/01/2018   HCT 41.9 03/01/2018                         CA Markers No results found for: CEA, CA125,  LABCA2                      Note: Lab results reviewed.  Recent Diagnostic Imaging Results  DG Chest 2 View CLINICAL DATA:  Increasing shortness of breath over the past couple of days.  EXAM: CHEST - 2 VIEW  COMPARISON:  CT chest 02/01/2018. Single-view of the chest 12/11/2017 and 02/15/2017.  FINDINGS: The lungs are emphysematous but clear. Heart size is normal. Aortic atherosclerosis is noted. No pneumothorax or pleural effusion. No acute bony abnormality.  IMPRESSION: Emphysema without acute disease.  Atherosclerosis.  Electronically Signed   By: Inge Rise M.D.   On: 02/07/2018 16:57  Complexity Note: Imaging results reviewed. Results shared with Theresa [name], using Layman's terms.                         Meds   Current Outpatient Medications:  .  ADVAIR DISKUS 250-50 MCG/DOSE AEPB, Inhale 1 puff into the lungs 2 (two) times daily., Disp: , Rfl: 3 .  ALPRAZolam (XANAX) 0.5 MG tablet, Take 1 tablet (0.5 mg total) by mouth 2 (two) times daily as needed for anxiety., Disp: 60 tablet, Rfl: 0 .  aspirin 81 MG tablet, Take 81 mg by mouth daily., Disp: , Rfl:  .  bisoprolol-hydrochlorothiazide (ZIAC) 10-6.25 MG tablet, Take 1 tablet by mouth daily., Disp: 90 tablet, Rfl: 2 .  Cholecalciferol ([employer] D3) 2000 units CAPS, Take 1 capsule by mouth daily., Disp: , Rfl:  .  DALIRESP 500 MCG TABS tablet, Take 500 mcg by mouth daily., Disp: , Rfl:  .  furosemide (LASIX) 40 MG tablet, Take 1  tablet (40 mg total) by mouth 2 (two) times daily., Disp: 180 tablet, Rfl: 3 .  gabapentin (NEURONTIN) 100 MG capsule, Take 1-3 capsules (100-300 mg total) by mouth at bedtime. Follow written titration schedule. (Patient taking differently: Take 100-300 mg by mouth at bedtime. Follow written titration schedule. Takes 300 mg 2-3 times per day), Disp: 90 capsule, Rfl: 2 .  HYDROcodone-acetaminophen (NORCO/VICODIN) 5-325 MG tablet, Take 1 tablet by mouth every 8 (eight) hours as needed for severe  pain., Disp: 90 tablet, Rfl: 0 .  ipratropium-albuterol (DUONEB) 0.5-2.5 (3) MG/3ML SOLN, Inhale 3 mLs into the lungs every 6 (six) hours as needed for shortness of breath., Disp: , Rfl: 3 .  levothyroxine (SYNTHROID, LEVOTHROID) 25 MCG tablet, Take 1 tablet (25 mcg total) by mouth daily before breakfast., Disp: 90 tablet, Rfl: 2 .  OXYGEN, Inhale 2.5 L into the lungs continuous. Reported on 11/03/2015, Disp: , Rfl:  .  potassium chloride SA (K-DUR,KLOR-CON) 20 MEQ tablet, Take 1 tablet (20 mEq total) by mouth 2 (two) times daily., Disp: 180 tablet, Rfl: 3 .  ranitidine (ZANTAC) 150 MG tablet, Take 150 mg by mouth 2 (two) times daily., Disp: , Rfl:  .  sertraline (ZOLOFT) 100 MG tablet, Take 2 tablets (200 mg total) by mouth every morning., Disp: 180 tablet, Rfl: 2 .  VENTOLIN HFA 108 (90 Base) MCG/ACT inhaler, Inhale 2 puffs into the lungs every 6 (six) hours as needed for shortness of breath., Disp: 1 Inhaler, Rfl: 5  ROS  Constitutional: Denies any fever or chills Gastrointestinal: No reported hemesis, hematochezia, vomiting, or acute GI distress Musculoskeletal: Denies any acute onset joint swelling, redness, loss of ROM, or weakness Neurological: No reported episodes of acute onset apraxia, aphasia, dysarthria, agnosia, amnesia, paralysis, loss of coordination, or loss of consciousness  Allergies  Theresa [name] is allergic to codeine and meloxicam.  PFSH  Drug: Theresa [name]  reports that she does not use drugs. Alcohol:  reports that she does not drink alcohol. Tobacco:  reports that she has been smoking cigarettes.  She has a 22.00 pack-year smoking history. She has never used smokeless tobacco. Medical:  has a past medical history of Acute postoperative pain (10/03/2017), Anxiety, Carpal tunnel syndrome, CHF (congestive heart failure) ([place]), CHF (congestive heart failure) ([place]), Chronic generalized abdominal pain, Chronic rhinitis, COPD (chronic obstructive pulmonary disease) ([place]), DDD  (degenerative disc disease), cervical, Depression, Edema, Flu, Gastritis, Hematuria, Hernia, abdominal, Hypertension, Kidney stones, Low back pain, Lumbar radiculitis, Malodorous urine, Muscle weakness, Obesity, Renal cyst, Sensory urge incontinence, and Tobacco abuse. Surgical: Theresa [name]  has a past surgical history that includes Abdominal hysterectomy; Tonsillectomy; Carpal tunnel release (Left); and Tubal ligation. Family: family history includes Alcohol abuse in her father; Cancer in her brother, brother, and sister; Coronary artery disease in her mother; Heart disease in her father and mother; Lung cancer in her sister; Pneumonia in her brother; Stroke in her mother.  Constitutional Exam  General appearance: Well nourished, well developed, and well hydrated. In no apparent acute distress Vitals:   05/02/18 1107  BP: 107/62  Pulse: 72  Temp: 98 F (36.7 C)  SpO2: 92%  Weight: 236 lb (107 kg)  Height: _0  (1.575 m)   BMI Assessment: Estimated body mass index is 43.16 kg/m as calculated from the following:   Height as of this encounter: _1  (1.575 m).   Weight as of this encounter: 236 lb (107 kg). Psych/Mental status: Alert, oriented x 3 (person, place, & time)  Eyes: PERLA Respiratory: Oxygen-dependent COPD  Lumbar Spine Area Exam  Skin & Axial Inspection: No masses, redness, or swelling Alignment: Symmetrical Functional ROM: Unrestricted ROM       Stability: No instability detected Muscle Tone/Strength: Functionally intact. No obvious neuro-muscular anomalies detected. Sensory (Neurological): Unimpaired Palpation: No palpable anomalies       Provocative Tests: Lumbar Hyperextension/rotation test: deferred today       Lumbar quadrant test (Kemp's test): deferred today       Lumbar Lateral bending test: deferred today       Patrick's Maneuver: deferred today                   FABER test: deferred today                   Thigh-thrust test: deferred today       S-I  compression test: deferred today       S-I distraction test: deferred today        Gait & Posture Assessment  Ambulation: Patient ambulates using a wheel chair Gait: Relatively normal for age and body habitus Posture: WNL   Lower Extremity Exam    Side: Right lower extremity  Side: Left lower extremity  Stability: No instability observed          Stability: No instability observed          Skin & Extremity Inspection: Skin color, temperature, and hair growth are WNL. No peripheral edema or cyanosis. No masses, redness, swelling, asymmetry, or associated skin lesions. No contractures.  Skin & Extremity Inspection: Skin color, temperature, and hair growth are WNL. No peripheral edema or cyanosis. No masses, redness, swelling, asymmetry, or associated skin lesions. No contractures.  Functional ROM: Unrestricted ROM                  Functional ROM: Unrestricted ROM                  Muscle Tone/Strength: Functionally intact. No obvious neuro-muscular anomalies detected.  Muscle Tone/Strength: Functionally intact. No obvious neuro-muscular anomalies detected.  Sensory (Neurological): Unimpaired  Sensory (Neurological): Unimpaired  Palpation: No palpable anomalies  Palpation: No palpable anomalies   Assessment  Primary Diagnosis & Pertinent Problem List: The primary encounter diagnosis was Lumbar spondylosis (L4-5). A diagnosis of Chronic hip pain (Tertiary Area of Pain) (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) was also pertinent to this visit.  Status Diagnosis  Controlled Controlled Controlled 1. Lumbar spondylosis (L4-5)   2. Chronic hip pain (Tertiary Area of Pain) (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)     Problems updated and reviewed during this visit: No problems updated. Plan of Care  Pharmacotherapy (Medications Ordered): No orders of the defined types were placed in this encounter.  New Prescriptions   No medications on file   Medications administered today: Theresa [name] had no medications administered during this  visit. Lab-work, procedure(s), and/or referral(s): No orders of the defined types were placed in this encounter.  Imaging and/or referral(s): None   Interventional therapies: Planned, scheduled, and/or pending: Not at this time   Cfor a onsidering:  Diagnostic bilateral lumbar facet block Possible bilateral lumbar facet RFA   Palliative PRN treatment(s):  Palliative left sided lumbar facetblock    Provider-requested follow-up: No follow-ups on file.  Future Appointments  Date Time Provider [place]  05/03/2018  8:30 AM Bary Castilla, Forest Gleason, MD [facility] None  05/08/2018  2:00 PM Harlin Heys, MD [facility] None  05/24/2018 11:00 AM [facility] [place]  07/09/2018  1:20 PM [name], FNP [facility] None  07/31/2018 11:30 AM Vevelyn Francois, NP [facility] None  01/21/2019  2:20 PM [facility]-NURSE HEALTH ADVISOR [facility] None  04/15/2019 10:00 AM Harlin Heys, MD [facility] None   Primary Care Physician: Margo Common, PA Location: [facility] Outpatient Pain Management Facility Note by: Vevelyn Francois NP Date: 05/02/2018; Time: 11:41 AM  Pain Score Disclaimer: We use the NRS-11 scale. This is a self-reported, subjective measurement of pain severity with only modest accuracy. It is used primarily to identify changes within a particular patient. It must be understood that outpatient pain scales are significantly less accurate that those used for research, where they can be applied under ideal controlled circumstances with minimal exposure to variables. In reality, the score is likely to be a combination of pain intensity and pain affect, where pain affect describes the degree of emotional arousal or changes in action readiness caused by the sensory experience of pain. Factors such as social and work situation, setting, emotional state, anxiety levels, expectation, and prior pain experience may influence pain perception and show large inter-individual differences that may  also be affected by time variables.  Patient instructions provided during this appointment: There are no Patient Instructions on file for this visit.

## 2018-05-02 NOTE — Telephone Encounter (Signed)
Please review for [facility].    Thanks,   -Mickel Baas

## 2018-05-02 NOTE — Progress Notes (Signed)
Nursing Pain Medication Assessment:  Safety precautions to be maintained throughout the outpatient stay will include: orient to surroundings, keep bed in low position, maintain call bell within reach at all times, provide assistance with transfer out of bed and ambulation.  Medication Inspection Compliance: Pill count conducted under aseptic conditions, in front of the patient. Neither the pills nor the bottle was removed from the patient's sight at any time. Once count was completed pills were immediately returned to the patient in their original bottle.  Medication: Hydrocodone/APAP Pill/Patch Count: 49 of 90 pills remain Pill/Patch Appearance: Markings consistent with prescribed medication Bottle Appearance: Standard pharmacy container. Clearly labeled. Filled Date:07/06/ 2019 Last Medication intake:  Today

## 2018-05-02 NOTE — Patient Instructions (Addendum)
____________________________________________________________________________________________  Medication Rules  Applies to: All patients receiving prescriptions (written or electronic).  Pharmacy of record: Pharmacy where electronic prescriptions will be sent. If written prescriptions are taken to a different pharmacy, please inform the nursing staff. The pharmacy listed in the electronic medical record should be the one where you would like electronic prescriptions to be sent.  Prescription refills: Only during scheduled appointments. Applies to both, written and electronic prescriptions.  NOTE: The following applies primarily to controlled substances (Opioid* Pain Medications).   Patient's responsibilities: 1. Pain Pills: Bring all pain pills to every appointment (except for procedure appointments). 2. Pill Bottles: Bring pills in original pharmacy bottle. Always bring newest bottle. Bring bottle, even if empty. 3. Medication refills: You are responsible for knowing and keeping track of what medications you need refilled. The day before your appointment, write a list of all prescriptions that need to be refilled. Bring that list to your appointment and give it to the admitting nurse. Prescriptions will be written only during appointments. If you forget a medication, it will not be "Called in", "Faxed", or "electronically sent". You will need to get another appointment to get these prescribed. 4. Prescription Accuracy: You are responsible for carefully inspecting your prescriptions before leaving our office. Have the discharge nurse carefully go over each prescription with you, before taking them home. Make sure that your name is accurately spelled, that your address is correct. Check the name and dose of your medication to make sure it is accurate. Check the number of pills, and the written instructions to make sure they are clear and accurate. Make sure that you are given enough medication to last  until your next medication refill appointment. 5. Taking Medication: Take medication as prescribed. Never take more pills than instructed. Never take medication more frequently than prescribed. Taking less pills or less frequently is permitted and encouraged, when it comes to controlled substances (written prescriptions).  6. Inform other Doctors: Always inform, all of your healthcare providers, of all the medications you take. 7. Pain Medication from other Providers: You are not allowed to accept any additional pain medication from any other Doctor or Healthcare provider. There are two exceptions to this rule. (see below) In the event that you require additional pain medication, you are responsible for notifying us, as stated below. 8. Medication Agreement: You are responsible for carefully reading and following our Medication Agreement. This must be signed before receiving any prescriptions from our practice. Safely store a copy of your signed Agreement. Violations to the Agreement will result in no further prescriptions. (Additional copies of our Medication Agreement are available upon request.) 9. Laws, Rules, & Regulations: All patients are expected to follow all Federal and State Laws, Statutes, Rules, & Regulations. Ignorance of the Laws does not constitute a valid excuse. The use of any illegal substances is prohibited. 10. Adopted CDC guidelines & recommendations: Target dosing levels will be at or below 60 MME/day. Use of benzodiazepines** is not recommended.  Exceptions: There are only two exceptions to the rule of not receiving pain medications from other Healthcare Providers. 1. Exception #1 (Emergencies): In the event of an emergency (i.e.: accident requiring emergency care), you are allowed to receive additional pain medication. However, you are responsible for: As soon as you are able, call our office [phone], at any time of the day or night, and leave a message stating your name, the  date and nature of the emergency, and the name and dose of the medication   prescribed. In the event that your call is answered by a member of our staff, make sure to document and save the date, time, and the name of the person that took your information.  2. Exception #2 (Planned Surgery): In the event that you are scheduled by another doctor or dentist to have any type of surgery or procedure, you are allowed (for a period no longer than 30 days), to receive additional pain medication, for the acute post-op pain. However, in this case, you are responsible for picking up a copy of our "Post-op Pain Management for Surgeons" handout, and giving it to your surgeon or dentist. This document is available at our office, and does not require an appointment to obtain it. Simply go to our office during business hours (Monday-Thursday from 8:00 AM to 4:00 PM) (Friday 8:00 AM to 12:00 Noon) or if you have a scheduled appointment with [place], prior to your surgery, and ask for it by name. In addition, you will need to provide [place] with your name, name of your surgeon, type of surgery, and date of procedure or surgery.  *Opioid medications include: morphine, codeine, oxycodone, oxymorphone, hydrocodone, hydromorphone, meperidine, tramadol, tapentadol, buprenorphine, fentanyl, methadone. **Benzodiazepine medications include: diazepam (Valium), alprazolam (Xanax), clonazepam (Klonopine), lorazepam (Ativan), clorazepate (Tranxene), chlordiazepoxide (Librium), estazolam (Prosom), oxazepam (Serax), temazepam (Restoril), triazolam (Halcion) (Last updated: 12/14/2017) ____________________________________________________________________________________________   ____________________________________________________________________________________________  Gabapentin Titration  Medication used: Gabapentin (Generic Name) or Neurontin (Brand Name) 300 mg tablets/capsules  Reasons to stop increasing the dose:  Reason 1: You get  good relief of symptoms, in which case there is no need to increase the daily dose any further.    Reason 2: You develop some side effects, such as sleeping all of the time, difficulty concentrating, or becoming disoriented, in which case you need to go down on the dose, to the prior level, where you were not experiencing any side effects. Stay on that dose longer, to allow more time for your body to get use it, before attempting to increase it again.   Reasons to stop increasing the dose: Reason 1: You get good relief of symptoms, in which case there is no need to increase the daily dose any further.  Reason 2: You develop some side effects, such as sleeping all of the time, difficulty concentrating, or becoming disoriented, in which case you need to go down on the dose, to the prior level, where you were not experiencing any side effects. Stay on that dose longer, to allow more time for your body to get use it, before attempting to increase it again.  Steps to increase medication: Step 1: Start by taking 1 (one) tablet at bedtime x 7 (seven) days.  Step 2: After 7 (seven) days of taking 1 (one) tablet at bedtime, increase it to 2 (two) tablets at bedtime. Stay on this dose x 7 (seven) days.  Step 3: After 7 (seven) days of taking 2 (two) tablets at bedtime, increase it to 3 (three) tablets at bedtime. Stay on this dose x another 7 (seven) days.  Step 4: After 7 (seven) days of taking 3 (three) tablet at bedtime, begin taking 1 (one) tablet at noon with lunch. Stay on this dose x another 7 (seven) days.  Step 5: After 7 (seven) days of taking 3 (three) tablet at bedtime, and 1 (one) tablet at noon, then begin taking 1 (one) tablet in the afternoon with dinner. Stay on this dose x another 7 (seven) days.  Step 6: After  7 (seven) days of taking 3 (three) tablet at bedtime, 1 (one) tablet at noon, and 1 (one) tablet in the afternoon, then begin taking 1 (one) tablet in the morning with breakfast.  Stay on this dose x another 7 (seven) days. At this point you should be taking the medicine 4 (four) times a day, or about every 6 (six) hours. This daily regimen of taking the medicine 4 (four) times a day, will be maintained from now on. You should not take any doses any sooner than every 6 (six) hours.  Step 7: After 7 (seven) days of taking 3 (three) tablet at bedtime, 1 (one) tablet at noon, 1 (one) tablet in the afternoon, and 1 (one) tablet in the morning, begin taking 2 (two) tablets at noon with lunch. Stay on this dose x another 7 (seven) days.   Step 8: After 7 (seven) days of taking 3 (three) tablet at bedtime, 2 (two) tablets at noon, 1 (one) tablet in the afternoon, and 1 (one) tablet in the morning, begin taking 2 (two) tablets in the afternoon with dinner. Stay on this dose x another 7 (seven) days.   Step 9: After 7 (seven) days of taking 3 (three) tablet at bedtime, 2 (two) tablets at noon, 2 (two) tablets in the afternoon, and 1 (one) tablet in the morning, begin taking 2 (two) tablets in the morning with breakfast. Stay on this dose x another 7 (seven) days. At this point you should be taking the medicine 4 (four) times a day, or about every 6 (six) hours. This daily regimen of taking the medicine 4 (four) times a day, will be maintained from now on. You should not take any doses any sooner than every 6 (six) hours.  Step 10: After 7 (seven) days of taking 3 (three) tablet at bedtime, 2 (two) tablets at noon, 2 (two) tablets in the afternoon, and 2 (two) tablets in the morning, begin taking 3 (three) tablets at noon with lunch. Stay on this dose x another 7 (seven) days.   Step 11: After 7 (seven) days of taking 3 (three) tablet at bedtime, 3 (three) tablets at noon, 2 (two) tablets in the afternoon, and 2 (two) tablets in the morning, begin taking 3 (three) tablets in the afternoon with dinner. Stay on this dose x another 7 (seven) days.   Step 12: After 7 (seven) days of taking 3  (three) tablet at bedtime, 3 (three) tablets at noon, 3 (three) tablets in the afternoon, and 2 (two) tablet in the morning, begin taking 3 (three) tablets in the morning with breakfast. Stay on this dose x another 7 (seven) days. At this point you should be taking the medicine 4 (four) times a day, or about every 6 (six) hours. This daily regimen of taking the medicine 4 (four) times a day, will be maintained from now on.   Endpoint: Once you have reached the maximum dose you can tolerate without side-effects, contact your physician so as to evaluate the results of the regimen.   Questions: Feel free to contact us for any questions or problems at [phone] ____________________________________________________________________________________________

## 2018-05-03 ENCOUNTER — Ambulatory Visit: Payer: Medicare HMO | Admitting: General Surgery

## 2018-05-03 ENCOUNTER — Telehealth: Payer: Self-pay | Admitting: General Surgery

## 2018-05-03 ENCOUNTER — Encounter: Payer: Self-pay | Admitting: General Surgery

## 2018-05-03 VITALS — BP 118/60 | HR 99 | Resp 18 | Ht 62.0 in | Wt 244.0 lb

## 2018-05-03 DIAGNOSIS — K439 Ventral hernia without obstruction or gangrene: Secondary | ICD-10-CM | POA: Diagnosis not present

## 2018-05-03 DIAGNOSIS — J9610 Chronic respiratory failure, unspecified whether with hypoxia or hypercapnia: Secondary | ICD-10-CM | POA: Diagnosis not present

## 2018-05-03 NOTE — Telephone Encounter (Signed)
I have l/m asking patient to call back. Needed to let her know the original receipt she received stating the paid $35 co-pay for today's visit with Dr Bary Castilla was voided,because she only paid $25. I have fix in I-Pay & Epic. Check to see if she would like a receipt.

## 2018-05-03 NOTE — Patient Instructions (Addendum)
The patient is aware to call back for any questions or concerns.  Medical clearance needed for surgery  Hernia, Adult A hernia is the bulging of an organ or tissue through a weak spot in the muscles of the abdomen (abdominal wall). Hernias develop most often near the navel or groin. There are many kinds of hernias. Common kinds include:  Femoral hernia. This kind of hernia develops under the groin in the upper thigh area.  Inguinal hernia. This kind of hernia develops in the groin or scrotum.  Umbilical hernia. This kind of hernia develops near the navel.  Hiatal hernia. This kind of hernia causes part of the stomach to be pushed up into the chest.  Incisional hernia. This kind of hernia bulges through a scar from an abdominal surgery.  What are the causes? This condition may be caused by:  Heavy lifting.  Coughing over a long period of time.  Straining to have a bowel movement.  An incision made during an abdominal surgery.  A birth defect (congenital defect).  Excess weight or obesity.  Smoking.  Poor nutrition.  Cystic fibrosis.  Excess fluid in the abdomen.  Undescended testicles.  What are the signs or symptoms? Symptoms of a hernia include:  A lump on the abdomen. This is the first sign of a hernia. The lump may become more obvious with standing, straining, or coughing. It may get bigger over time if it is not treated or if the condition causing it is not treated.  Pain. A hernia is usually painless, but it may become painful over time if treatment is delayed. The pain is usually dull and may get worse with standing or lifting heavy objects.  Sometimes a hernia gets tightly squeezed in the weak spot (strangulated) or stuck there (incarcerated) and causes additional symptoms. These symptoms may include:  Vomiting.  Nausea.  Constipation.  Irritability.  How is this diagnosed? A hernia may be diagnosed with:  A physical exam. During the exam your  health care provider may ask you to cough or to make a specific movement, because a hernia is usually more visible when you move.  Imaging tests. These can include: ? X-rays. ? Ultrasound. ? CT scan.  How is this treated? A hernia that is small and painless may not need to be treated. A hernia that is large or painful may be treated with surgery. Inguinal hernias may be treated with surgery to prevent incarceration or strangulation. Strangulated hernias are always treated with surgery, because lack of blood to the trapped organ or tissue can cause it to die. Surgery to treat a hernia involves pushing the bulge back into place and repairing the weak part of the abdomen. Follow these instructions at home:  Avoid straining.  Do not lift anything heavier than 10 lb (4.5 kg).  Lift with your leg muscles, not your back muscles. This helps avoid strain.  When coughing, try to cough gently.  Prevent constipation. Constipation leads to straining with bowel movements, which can make a hernia worse or cause a hernia repair to break down. You can prevent constipation by: ? Eating a high-fiber diet that includes plenty of fruits and vegetables. ? Drinking enough fluids to keep your urine clear or pale yellow. Aim to drink 6-8 glasses of water per day. ? Using a stool softener as directed by your health care provider.  Lose weight, if you are overweight.  Do not use any tobacco products, including cigarettes, chewing tobacco, or electronic cigarettes. If you  need help quitting, ask your health care provider.  Keep all follow-up visits as directed by your health care provider. This is important. Your health care provider may need to monitor your condition. Contact a health care provider if:  You have swelling, redness, and pain in the affected area.  Your bowel habits change. Get help right away if:  You have a fever.  You have abdominal pain that is getting worse.  You feel nauseous or you  vomit.  You cannot push the hernia back in place by gently pressing on it while you are lying down.  The hernia: ? Changes in shape or size. ? Is stuck outside the abdomen. ? Becomes discolored. ? Feels hard or tender. This information is not intended to replace advice given to you by your health care provider. Make sure you discuss any questions you have with your health care provider. Document Released: 10/03/2005 Document Revised: 03/02/2016 Document Reviewed: 08/13/2014 Elsevier Interactive Patient Education  2017 [employer].

## 2018-05-03 NOTE — Progress Notes (Signed)
Patient ID: Theresa [name], female   DOB: Feb 08, 1956, 62 y.o.   MRN: [id number]  Chief Complaint  Patient presents with  . Hernia    HPI Theresa [name] is a 62 y.o. female.  Patient here today for an evaluation of a umbilical hernia referred by Dr Amalia Hailey.  She states that she has noticed it for about 2 years.   It does seem to be causing some abdominal pain progressively getting worse over the past 6 months.  When the knot comes out she can bring her to her knees lasting 2 hours. Nausea, no vomiting, no constipation or diarrhea noted. Bowels move regular and daily. She has been wearing chronic oxygen for 5 years. She is here with her husband, Winferd Humphrey  Pulmonary followed by Dr Raul Del Dr Clayborn Bigness is cardiologist. Pain clinic, Dr Dossie Arbour, for degenerative disc disease. Marland Kitchen HPI  Past Medical History:  Diagnosis Date  . Acute postoperative pain 10/03/2017  . Anxiety   . Carpal tunnel syndrome   . CHF (congestive heart failure) ([place])    1/18  . CHF (congestive heart failure) ([place])   . Chronic generalized abdominal pain   . Chronic rhinitis   . COPD (chronic obstructive pulmonary disease) ([place])   . DDD (degenerative disc disease), cervical   . Depression   . Edema   . Flu    1/18  . Gastritis   . Hematuria   . Hernia, abdominal   . Hypertension   . Kidney stones   . Low back pain   . Lumbar radiculitis   . Malodorous urine   . Muscle weakness   . Obesity   . Oxygen dependent   . Renal cyst   . Sensory urge incontinence   . Tobacco abuse     Past Surgical History:  Procedure Laterality Date  . ABDOMINAL HYSTERECTOMY    . CARPAL TUNNEL RELEASE Left   . TONSILLECTOMY    . TUBAL LIGATION      Family History  Problem Relation Age of Onset  . Heart disease Mother   . Stroke Mother   . Coronary artery disease Mother   . Lung cancer Sister   . Cancer Sister   . Alcohol abuse Father   . Heart disease Father   . Cancer Brother   . Cancer Brother   . Pneumonia Brother    . Prostate cancer Neg Hx   . Kidney cancer Neg Hx   . Bladder Cancer Neg Hx     Social History Social History   Tobacco Use  . Smoking status: Current Every Day Smoker    Packs/day: 1.00    Years: 44.00    Pack years: 44.00    Types: Cigarettes  . Smokeless tobacco: Never Used  Substance Use Topics  . Alcohol use: No    Frequency: Never  . Drug use: No    Allergies  Allergen Reactions  . Codeine Nausea And Vomiting  . Meloxicam Other (See Comments)    Stomach pain    Current Outpatient Medications  Medication Sig Dispense Refill  . ADVAIR DISKUS 250-50 MCG/DOSE AEPB Inhale 1 puff into the lungs 2 (two) times daily.  3  . ALPRAZolam (XANAX) 0.5 MG tablet Take 1 tablet (0.5 mg total) by mouth 2 (two) times daily as needed for anxiety. 60 tablet 0  . aspirin 81 MG tablet Take 81 mg by mouth daily.    . bisoprolol-hydrochlorothiazide (ZIAC) 10-6.25 MG tablet Take 1 tablet by mouth daily. 90 tablet 0  .  Cholecalciferol ([employer] D3) 2000 units CAPS Take 1 capsule by mouth daily.    Marland Kitchen DALIRESP 500 MCG TABS tablet Take 500 mcg by mouth daily.    . furosemide (LASIX) 40 MG tablet Take 1 tablet (40 mg total) by mouth 2 (two) times daily. 180 tablet 3  . gabapentin (NEURONTIN) 300 MG capsule Take 1 capsule (300 mg total) by mouth 4 (four) times daily. Follow written titration schedule. 120 capsule 2  . [START ON 07/20/2018] HYDROcodone-acetaminophen (NORCO/VICODIN) 5-325 MG tablet Take 1 tablet by mouth every 8 (eight) hours as needed for severe pain. 90 tablet 0  . ipratropium-albuterol (DUONEB) 0.5-2.5 (3) MG/3ML SOLN Inhale 3 mLs into the lungs every 6 (six) hours as needed for shortness of breath.  3  . levothyroxine (SYNTHROID, LEVOTHROID) 25 MCG tablet Take 1 tablet (25 mcg total) by mouth daily before breakfast. 90 tablet 2  . OXYGEN Inhale 2.5 L into the lungs continuous. Reported on 11/03/2015    . potassium chloride SA (K-DUR,KLOR-CON) 20 MEQ tablet Take 1 tablet (20 mEq total)  by mouth 2 (two) times daily. 180 tablet 3  . ranitidine (ZANTAC) 150 MG tablet Take 150 mg by mouth 2 (two) times daily.    . sertraline (ZOLOFT) 100 MG tablet Take 2 tablets (200 mg total) by mouth every morning. 180 tablet 2  . VENTOLIN HFA 108 (90 Base) MCG/ACT inhaler Inhale 2 puffs into the lungs every 6 (six) hours as needed for shortness of breath. 1 Inhaler 5  . [START ON 06/20/2018] HYDROcodone-acetaminophen (NORCO/VICODIN) 5-325 MG tablet Take 1 tablet by mouth every 8 (eight) hours as needed for severe pain. (Patient not taking: Reported on 05/03/2018) 90 tablet 0  . [START ON 05/21/2018] HYDROcodone-acetaminophen (NORCO/VICODIN) 5-325 MG tablet Take 1 tablet by mouth every 8 (eight) hours as needed for severe pain. (Patient not taking: Reported on 05/03/2018) 90 tablet 0   No current facility-administered medications for this visit.     Review of Systems Review of Systems  Constitutional: Negative.   HENT: Negative.   Eyes: Negative.   Respiratory: Positive for cough and shortness of breath.   Cardiovascular: Positive for chest pain.  Gastrointestinal: Positive for nausea. Negative for blood in stool, constipation and vomiting.  Endocrine: Negative.   Genitourinary: Negative.   Musculoskeletal: Negative.   Skin: Negative.   Allergic/Immunologic: Negative.   Neurological: Negative.   Hematological: Negative.   Psychiatric/Behavioral: Negative.     Blood pressure 118/60, pulse 99, resp. rate 18, height _0  (1.575 m), weight 244 lb (110.7 kg), SpO2 92 %.  Physical Exam Physical Exam  Constitutional: She is oriented to person, place, and time. She appears well-developed and well-nourished.  HENT:  Mouth/Throat: Oropharynx is clear and moist.  Eyes: Conjunctivae are normal. No scleral icterus.  Neck: Neck supple.  Cardiovascular: Normal rate, regular rhythm and normal heart sounds.  Pulmonary/Chest: Effort normal and breath sounds normal.  Abdominal: Soft. Bowel sounds are  normal. There is tenderness. A hernia is present.    1.5 cm defect Epigastric and umbilical hernia  Lymphadenopathy:    She has no cervical adenopathy.  Neurological: She is alert and oriented to person, place, and time.  Skin: Skin is warm and dry.  Psychiatric: Her behavior is normal.    Data Reviewed Chest x-ray obtained during ED visit for increasing shortness of breath on February 07, 2018 showed emphysema without acute disease. Chest CT of February 02, 2018 for lung cancer screening: IMPRESSION: 1. Lung-RADS 2, benign appearance  or behavior. Continue annual screening with low-dose chest CT without contrast in 12 months. 2. Three-vessel coronary atherosclerosis. 3. Stable dilated main pulmonary artery, suggesting chronic pulmonary arterial hypertension.  Aortic Atherosclerosis (ICD10-I70.0) and Emphysema (ICD10-J43.9).  April 03, 2018 pulmonary visit with Dr. Raul Del reviewed.  March 29, 2018 cardiology notes reviewed.   Assessment    Symptomatic umbilical hernia with no evidence of obstruction.  Epigastric hernia.  Severe multisystem disease.    Plan    Medical clearance needed for surgery.  Will forward a note to both her cardiologist and pulmonologist for their insight regarding her safety for elective hernia repair.  While they have both seen the patient within the last 6 weeks, they may want to see her prior to surgical intervention.  Once medical clearance is obtained, we will schedule a procedure.  The role of prosthetic mesh was discussed in light of her obesity, pulmonary disease with chronic cough.  Hernia precautions and incarceration were discussed with the patient. If they develop symptoms of an incarcerated hernia, they were encouraged to seek prompt medical attention.  I have recommended repair of the hernia using mesh on an outpatient basis in the near future. The risk of infection was reviewed. The role of prosthetic mesh to minimize the risk of recurrence was  reviewed.        HPI, Physical Exam, Assessment and Plan have been scribed under the direction and in the presence of Robert Bellow, MD. Karie Fetch, RN   [facility]st Gleason Johnice Riebe 05/04/2018, 6:19 PM

## 2018-05-04 ENCOUNTER — Other Ambulatory Visit: Payer: Self-pay | Admitting: Family Medicine

## 2018-05-04 DIAGNOSIS — K439 Ventral hernia without obstruction or gangrene: Secondary | ICD-10-CM | POA: Insufficient documentation

## 2018-05-04 NOTE — Telephone Encounter (Signed)
Left message advising patient to return call to let us know if she would like RX sent to local pharmacy instead of mail order.

## 2018-05-04 NOTE — Telephone Encounter (Signed)
This was refilled by Simona Huh recently to mail order pharmacy. Does patient wish to change the rx to [facility]?

## 2018-05-08 ENCOUNTER — Other Ambulatory Visit (HOSPITAL_COMMUNITY)
Admission: RE | Admit: 2018-05-08 | Discharge: 2018-05-08 | Disposition: A | Discharge status: Home / Self Care | Payer: Medicare HMO | Source: Ambulatory Visit | Attending: Obstetrics and Gynecology | Admitting: Obstetrics and Gynecology

## 2018-05-08 ENCOUNTER — Ambulatory Visit (INDEPENDENT_AMBULATORY_CARE_PROVIDER_SITE_OTHER): Payer: Medicare HMO | Admitting: Obstetrics and Gynecology

## 2018-05-08 ENCOUNTER — Encounter: Payer: Self-pay | Admitting: Obstetrics and Gynecology

## 2018-05-08 VITALS — BP 86/58 | HR 95 | Ht 62.0 in | Wt 241.2 lb

## 2018-05-08 DIAGNOSIS — N888 Other specified noninflammatory disorders of cervix uteri: Secondary | ICD-10-CM | POA: Diagnosis not present

## 2018-05-08 DIAGNOSIS — N72 Inflammatory disease of cervix uteri: Secondary | ICD-10-CM | POA: Insufficient documentation

## 2018-05-08 DIAGNOSIS — B977 Papillomavirus as the cause of diseases classified elsewhere: Secondary | ICD-10-CM | POA: Diagnosis not present

## 2018-05-08 NOTE — Progress Notes (Signed)
HPI:  Theresa [name] is a 62 y.o.  [phone]  who presents today for evaluation and management of abnormal cervical cytology.    Dysplasia History: ASCUS-positive HPV  ROS: Noncontributory to current medical issue  OB History  Gravida Para Term Preterm AB Living  _0 0 0 2  SAB TAB Ectopic Multiple Live Births  0 0 0   2    # Outcome Date GA Lbr Len/2nd Weight Sex Delivery Anes PTL Lv  2 Term 1983    F Vag-Spont  N LIV  1 Term 57    M Vag-Spont  N LIV    Obstetric Comments  1st Menstrual Cycle:  14   1st Pregnancy:  22    Past Medical History:  Diagnosis Date  . Acute postoperative pain 10/03/2017  . Anxiety   . Carpal tunnel syndrome   . CHF (congestive heart failure) ([place])    1/18  . CHF (congestive heart failure) ([place])   . Chronic generalized abdominal pain   . Chronic rhinitis   . COPD (chronic obstructive pulmonary disease) ([place])   . DDD (degenerative disc disease), cervical   . Depression   . Edema   . Flu    1/18  . Gastritis   . Hematuria   . Hernia, abdominal   . Hypertension   . Kidney stones   . Low back pain   . Lumbar radiculitis   . Malodorous urine   . Muscle weakness   . Obesity   . Oxygen dependent   . Renal cyst   . Sensory urge incontinence   . Tobacco abuse     Past Surgical History:  Procedure Laterality Date  . ABDOMINAL HYSTERECTOMY    . CARPAL TUNNEL RELEASE Left   . TONSILLECTOMY    . TUBAL LIGATION      SOCIAL HISTORY: Social History   Substance and Sexual Activity  Alcohol Use No  . Frequency: Never   Social History   Substance and Sexual Activity  Drug Use No     Family History  Problem Relation Age of Onset  . Heart disease Mother   . Stroke Mother   . Coronary artery disease Mother   . Lung cancer Sister   . Cancer Sister   . Alcohol abuse Father   . Heart disease Father   . Cancer Brother   . Cancer Brother   . Pneumonia Brother   . Prostate cancer Neg Hx   . Kidney cancer Neg Hx   . Bladder  Cancer Neg Hx     ALLERGIES:  Codeine and Meloxicam  She has a current medication list which includes the following prescription(s): advair diskus, alprazolam, aspirin, bisoprolol-hydrochlorothiazide, cholecalciferol, daliresp, furosemide, gabapentin, hydrocodone-acetaminophen, ipratropium-albuterol, levothyroxine, oxygen-helium, potassium chloride sa, ranitidine, sertraline, ventolin hfa, hydrocodone-acetaminophen, and hydrocodone-acetaminophen.  Physical Exam: -Vitals:  BP (!) 86/58   Pulse 95   Ht _1  (1.575 m)   Wt 241 lb 3.2 oz (109.4 kg)   BMI 44.12 kg/m  GEN: WD, WN, NAD.  A+ O x 3, good mood and affect. ABD:  NT, ND.  Soft, no masses.  No hernias noted.   Pelvic:   Vulva: Normal appearance.  No lesions.  Vagina: No lesions or abnormalities noted.  Support: Normal pelvic support.  Urethra No masses tenderness or scarring.  Meatus Normal size without lesions or prolapse.  Cervix: See below.  Anus: Normal exam.  No lesions.  Perineum: Normal exam.  No lesions.  Bimanual   Uterus: Normal size.  Non-tender.  Mobile.  AV.  Adnexae: No masses.  Non-tender to palpation.  Cul-de-sac: Negative for abnormality.   PROCEDURE: 1.  Urine Pregnancy Test:  not done 2.  Colposcopy performed with 4% acetic acid after verbal consent obtained                           -Aceto-white Lesions Location(s): NONE o'clock.                            -ECC indicated and performed: Yes.       -Biopsy sites made hemostatic with pressure and Monsel's solution   -Satisfactory colposcopy: Yes.      -Evidence of Invasive cervical CA :  NO  ASSESSMENT:  Theresa [name] is a 62 y.o. [id number] here for  1. High risk human papilloma virus (HPV) infection of cervix   .  PLAN: 1.  I discussed the grading system of pap smears and HPV high risk viral types.  We will discuss management after colpo results return.  No orders of the defined types were placed in this encounter.          F/U  Return  in about 1 week (around 05/15/2018) for Colpo f/u.  Jeannie Fend ,MD 05/08/2018,2:51 PM

## 2018-05-08 NOTE — Telephone Encounter (Signed)
Patient states she did not request RX from [facility]. She wanted the RX sent to mail order.

## 2018-05-16 ENCOUNTER — Other Ambulatory Visit: Payer: Self-pay | Admitting: Family Medicine

## 2018-05-16 DIAGNOSIS — H5201 Hypermetropia, right eye: Secondary | ICD-10-CM | POA: Diagnosis not present

## 2018-05-16 DIAGNOSIS — H524 Presbyopia: Secondary | ICD-10-CM | POA: Diagnosis not present

## 2018-05-16 DIAGNOSIS — H2513 Age-related nuclear cataract, bilateral: Secondary | ICD-10-CM | POA: Diagnosis not present

## 2018-05-16 DIAGNOSIS — H5212 Myopia, left eye: Secondary | ICD-10-CM | POA: Diagnosis not present

## 2018-05-16 DIAGNOSIS — H52221 Regular astigmatism, right eye: Secondary | ICD-10-CM | POA: Diagnosis not present

## 2018-05-16 DIAGNOSIS — H52222 Regular astigmatism, left eye: Secondary | ICD-10-CM | POA: Diagnosis not present

## 2018-05-18 ENCOUNTER — Telehealth: Payer: Self-pay | Admitting: *Deleted

## 2018-05-18 NOTE — Telephone Encounter (Signed)
Patient called and wanted to know if we had gotten any information about her medical clearance that is needed before surgery.

## 2018-05-21 ENCOUNTER — Encounter: Payer: Self-pay | Admitting: Obstetrics and Gynecology

## 2018-05-21 ENCOUNTER — Ambulatory Visit (INDEPENDENT_AMBULATORY_CARE_PROVIDER_SITE_OTHER): Payer: Medicare HMO | Admitting: Obstetrics and Gynecology

## 2018-05-21 VITALS — BP 119/79 | HR 84 | Ht 62.0 in | Wt 248.0 lb

## 2018-05-21 DIAGNOSIS — N72 Inflammatory disease of cervix uteri: Secondary | ICD-10-CM

## 2018-05-21 DIAGNOSIS — B977 Papillomavirus as the cause of diseases classified elsewhere: Secondary | ICD-10-CM

## 2018-05-21 NOTE — Progress Notes (Signed)
Pt is present today for test results from a colpo.

## 2018-05-21 NOTE — Progress Notes (Signed)
HPI:      Theresa [name] is a 62 y.o. [id number] who LMP was No LMP recorded. Patient is postmenopausal.  Subjective:   She presents today for follow-up of her colposcopy.  She has positive HPV but negative cytology.  Of significant note the patient does smoke and has COPD.   Hx: The following portions of the patient's history were reviewed and updated as appropriate:             She  has a past medical history of Acute postoperative pain (10/03/2017), Anxiety, Carpal tunnel syndrome, CHF (congestive heart failure) ([place]), CHF (congestive heart failure) ([place]), Chronic generalized abdominal pain, Chronic rhinitis, COPD (chronic obstructive pulmonary disease) ([place]), DDD (degenerative disc disease), cervical, Depression, Edema, Flu, Gastritis, Hematuria, Hernia, abdominal, Hypertension, Kidney stones, Low back pain, Lumbar radiculitis, Malodorous urine, Muscle weakness, Obesity, Oxygen dependent, Renal cyst, Sensory urge incontinence, and Tobacco abuse. She does not have any pertinent problems on file. She  has a past surgical history that includes Abdominal hysterectomy; Tonsillectomy; Carpal tunnel release (Left); and Tubal ligation. Her family history includes Alcohol abuse in her father; Cancer in her brother, brother, and sister; Coronary artery disease in her mother; Heart disease in her father and mother; Lung cancer in her sister; Pneumonia in her brother; Stroke in her mother. She  reports that she has been smoking cigarettes.  She has a 44.00 pack-year smoking history. She has never used smokeless tobacco. She reports that she does not drink alcohol or use drugs. She has a current medication list which includes the following prescription(s): advair diskus, alprazolam, aspirin, bisoprolol-hydrochlorothiazide, cholecalciferol, daliresp, furosemide, gabapentin, hydrocodone-acetaminophen, hydrocodone-acetaminophen, hydrocodone-acetaminophen, ipratropium-albuterol, levothyroxine, oxygen-helium,  potassium chloride sa, ranitidine, sertraline, and ventolin hfa. She is allergic to codeine and meloxicam.       Review of Systems:  Review of Systems  Constitutional: Denied constitutional symptoms, night sweats, recent illness, fatigue, fever, insomnia and weight loss.  Eyes: Denied eye symptoms, eye pain, photophobia, vision change and visual disturbance.  Ears/Nose/Throat/Neck: Denied ear, nose, throat or neck symptoms, hearing loss, nasal discharge, sinus congestion and sore throat.  Cardiovascular: Denied cardiovascular symptoms, arrhythmia, chest pain/pressure, edema, exercise intolerance, orthopnea and palpitations.  Respiratory: Denied pulmonary symptoms, asthma, pleuritic pain, productive sputum, cough, dyspnea and wheezing.  Gastrointestinal: Denied, gastro-esophageal reflux, melena, nausea and vomiting.  Genitourinary: Denied genitourinary symptoms including symptomatic vaginal discharge, pelvic relaxation issues, and urinary complaints.  Musculoskeletal: Denied musculoskeletal symptoms, stiffness, swelling, muscle weakness and myalgia.  Dermatologic: Denied dermatology symptoms, rash and scar.  Neurologic: Denied neurology symptoms, dizziness, headache, neck pain and syncope.  Psychiatric: Denied psychiatric symptoms, anxiety and depression.  Endocrine: Denied endocrine symptoms including hot flashes and night sweats.   Meds:   Current Outpatient Medications on File Prior to Visit  Medication Sig Dispense Refill  . ADVAIR DISKUS 250-50 MCG/DOSE AEPB Inhale 1 puff into the lungs 2 (two) times daily.  3  . ALPRAZolam (XANAX) 0.5 MG tablet Take 1 tablet (0.5 mg total) by mouth 2 (two) times daily as needed for anxiety. 60 tablet 0  . aspirin 81 MG tablet Take 81 mg by mouth daily.    . bisoprolol-hydrochlorothiazide (ZIAC) 10-6.25 MG tablet Take 1 tablet by mouth daily. 90 tablet 0  . Cholecalciferol ([employer] D3) 2000 units CAPS Take 1 capsule by mouth daily.    Marland Kitchen DALIRESP 500 MCG  TABS tablet Take 500 mcg by mouth daily.    . furosemide (LASIX) 40 MG tablet Take 1 tablet (40 mg total) by  mouth 2 (two) times daily. 180 tablet 3  . gabapentin (NEURONTIN) 300 MG capsule Take 1 capsule (300 mg total) by mouth 4 (four) times daily. Follow written titration schedule. 120 capsule 2  . [START ON 07/20/2018] HYDROcodone-acetaminophen (NORCO/VICODIN) 5-325 MG tablet Take 1 tablet by mouth every 8 (eight) hours as needed for severe pain. 90 tablet 0  . [START ON 06/20/2018] HYDROcodone-acetaminophen (NORCO/VICODIN) 5-325 MG tablet Take 1 tablet by mouth every 8 (eight) hours as needed for severe pain. 90 tablet 0  . HYDROcodone-acetaminophen (NORCO/VICODIN) 5-325 MG tablet Take 1 tablet by mouth every 8 (eight) hours as needed for severe pain. 90 tablet 0  . ipratropium-albuterol (DUONEB) 0.5-2.5 (3) MG/3ML SOLN Inhale 3 mLs into the lungs every 6 (six) hours as needed for shortness of breath.  3  . levothyroxine (SYNTHROID, LEVOTHROID) 25 MCG tablet Take 1 tablet (25 mcg total) by mouth daily before breakfast. 90 tablet 2  . OXYGEN Inhale 2.5 L into the lungs continuous. Reported on 11/03/2015    . potassium chloride SA (K-DUR,KLOR-CON) 20 MEQ tablet Take 1 tablet (20 mEq total) by mouth 2 (two) times daily. 180 tablet 3  . ranitidine (ZANTAC) 150 MG tablet Take 150 mg by mouth 2 (two) times daily.    . sertraline (ZOLOFT) 100 MG tablet Take 2 tablets (200 mg total) by mouth every morning. 180 tablet 2  . VENTOLIN HFA 108 (90 Base) MCG/ACT inhaler Inhale 2 puffs into the lungs every 6 (six) hours as needed for shortness of breath. 1 Inhaler 5   No current facility-administered medications on file prior to visit.     Objective:     Vitals:   05/21/18 1522  BP: 119/79  Pulse: 84              Endocervical curettage negative  Assessment:    [id number] Patient Active Problem List   Diagnosis Date Noted  . Ventral hernia without obstruction or gangrene 05/04/2018  . Chronic lower  extremity pain (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) 01/25/2018  . Altered mental status, unspecified 12/09/2017  . Chronic hip pain [facility]) (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) 11/27/2017  . COPD (chronic obstructive pulmonary disease) ([place]) 08/11/2017  . Other long term (current) drug therapy 08/08/2017  . Disorder of bone, unspecified 08/08/2017  . COPD with hypoxia ([place]) 06/27/2017  . Arthralgia of acromioclavicular joint (Right) 06/15/2017  . Acromioclavicular joint DJD (Right) 06/15/2017  . 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  . Chronic shoulder pain (Left) 05/11/2017  . Elevated brain natriuretic peptide (BNP) level 05/11/2017  . Grief at loss of child 02/14/2017  . Lumbar facet hypertrophy (multilevel) 11/17/2016  . Lumbar spinal stenosis (L4-5) 11/17/2016  . 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) 11/17/2016  . 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 Lumbosacral radiculopathy at L4 11/17/2016  . Supplemental oxygen dependent 08/01/2016  . Sleep apnea 06/02/2016  . Long term current use of opiate analgesic 01/12/2016  . Long term prescription opiate use 01/12/2016  . Opiate use (15 MME/Day) 01/12/2016  . Lumbar facet syndrome (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) 01/12/2016  . Chronic sacroiliac joint pain (Bilateral) 01/12/2016  . Vitamin D deficiency 12/30/2015  . 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 Obesity, Class III, BMI 40-49.9 (morbid obesity) ([place]) 12/28/2015  . 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[place]) 11/19/2015  . Coagulation disorder ([place]) 10/22/2015  . Chronic pain syndrome (significant psychosocial component) 09/21/2015  . 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 09/21/2015  . Encounter for therapeutic drug  level monitoring 09/16/2015  . Encounter for chronic pain management 09/16/2015  . Pain management 09/16/2015  . Neurogenic pain 09/16/2015  . Neuropathic pain 09/16/2015  . Musculoskeletal pain 09/16/2015   . Lumbar spondylosis (L4-5) 09/16/2015  . Chronic low back pain (Primary Area of Pain) (Left) 09/16/2015  . Chronic lower extremity pain (Secondary Area of Pain) (Left) 09/16/2015  . Chronic lumbar radicular pain (L4 & S1 Dermatome) (Left) 09/16/2015  . Chronic shoulder pain (Right side) 09/16/2015  . Chronic upper extremity pain (Right-sided) 09/16/2015  . Cervical radiculitis (Right side) 09/16/2015  . Abnormal x-ray of lumbar spine 09/16/2015  . Chronic diastolic heart failure ([place]) 07/15/2015  . Chest pain 07/15/2015  . Tobacco abuse 07/15/2015  . Blood clotting disorder ([place]) 04/21/2015  . Decreased motor strength 07/16/2014  . Clinical depression 07/16/2014  . Chronic rhinitis 07/16/2014  . Carpal tunnel syndrome 07/16/2014  . Anxiety state 07/16/2014  . Neuritis or radiculitis due to rupture of lumbar intervertebral disc 07/02/2014  . DDD (degenerative disc disease), lumbar 07/02/2014  . CAFL (chronic airflow limitation) ([place]) 02/27/2014  . Essential (primary) hypertension 03/27/2008     1. High risk human papilloma virus (HPV) infection of cervix     HPV without cytology changes.   Plan:            1.  We have discussed these findings in great detail.  I have advised her to have a Pap smear yearly.  I have advised her to discontinue tobacco use.  Natural course and history of HPV discussed in detail. Orders No orders of the defined types were placed in this encounter.   No orders of the defined types were placed in this encounter.     F/U  Return in about 10 months (around 03/22/2019) for Annual Physical. I spent 16 minutes involved in the care of this patient of which greater than 50% was spent discussing HPV, colposcopy findings, necessity of annual Pap smears, smoking cessation and rationale for this.  All questions answered.  Finis Bud, M.D. 05/21/2018 4:10 PM

## 2018-05-22 DIAGNOSIS — J9610 Chronic respiratory failure, unspecified whether with hypoxia or hypercapnia: Secondary | ICD-10-CM | POA: Diagnosis not present

## 2018-05-30 DIAGNOSIS — Z0100 Encounter for examination of eyes and vision without abnormal findings: Secondary | ICD-10-CM | POA: Diagnosis not present

## 2018-05-31 DIAGNOSIS — J9610 Chronic respiratory failure, unspecified whether with hypoxia or hypercapnia: Secondary | ICD-10-CM | POA: Diagnosis not present

## 2018-06-03 DIAGNOSIS — J9610 Chronic respiratory failure, unspecified whether with hypoxia or hypercapnia: Secondary | ICD-10-CM | POA: Diagnosis not present

## 2018-06-05 IMAGING — CR DG CHEST 2V
1 series · 2 of 2 positions shown · non-contrast
Comparison: CT chest 02/01/2018. Single-view of the chest
12/11/2017 and 02/15/2017.

CLINICAL DATA: Increasing shortness of breath over the past couple
of days.

EXAM:
CHEST - 2 VIEW

[Series 1: dg chest 2 view · 0.14mm/px · 2 of 2 slices shown]
[im 1/2]
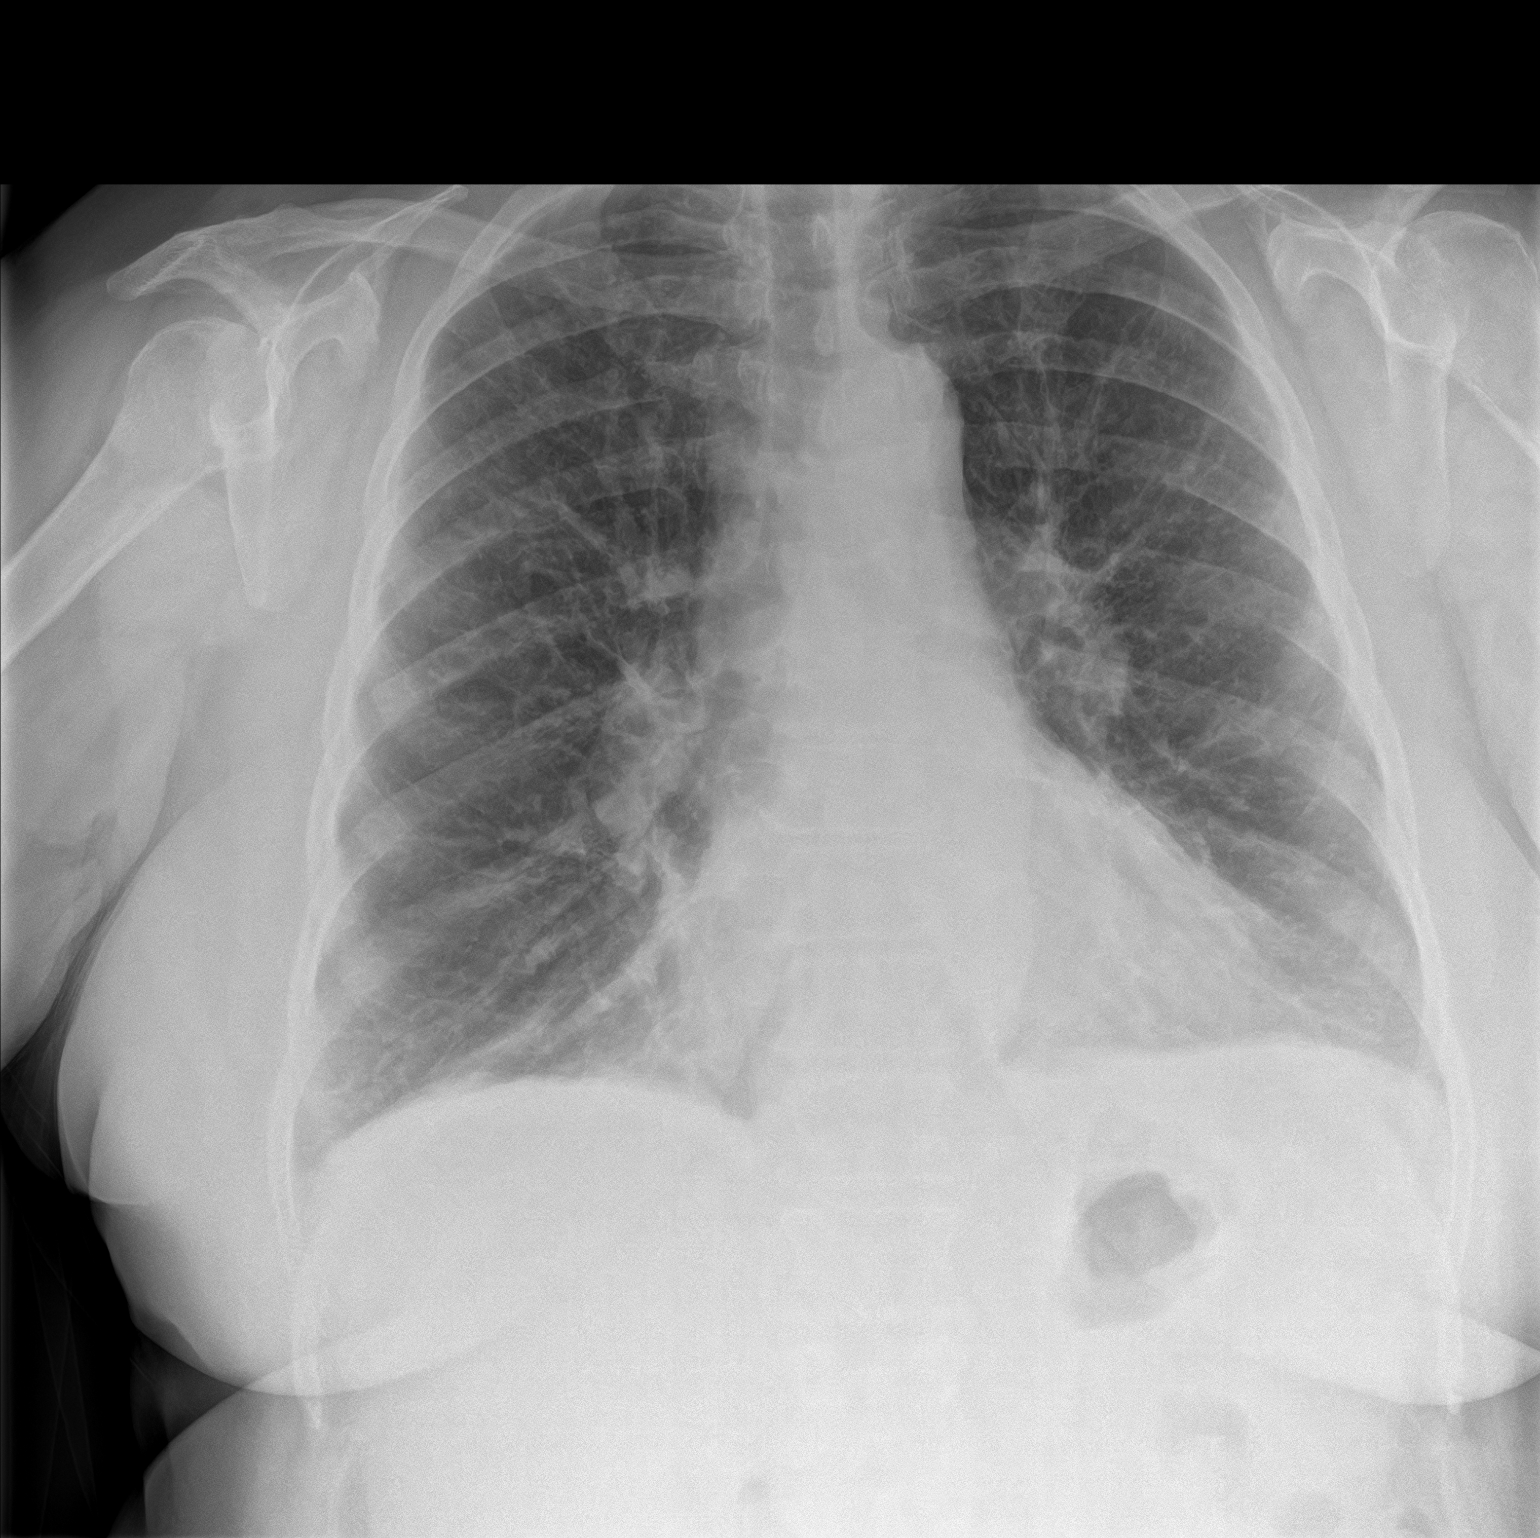
[im 2/2]
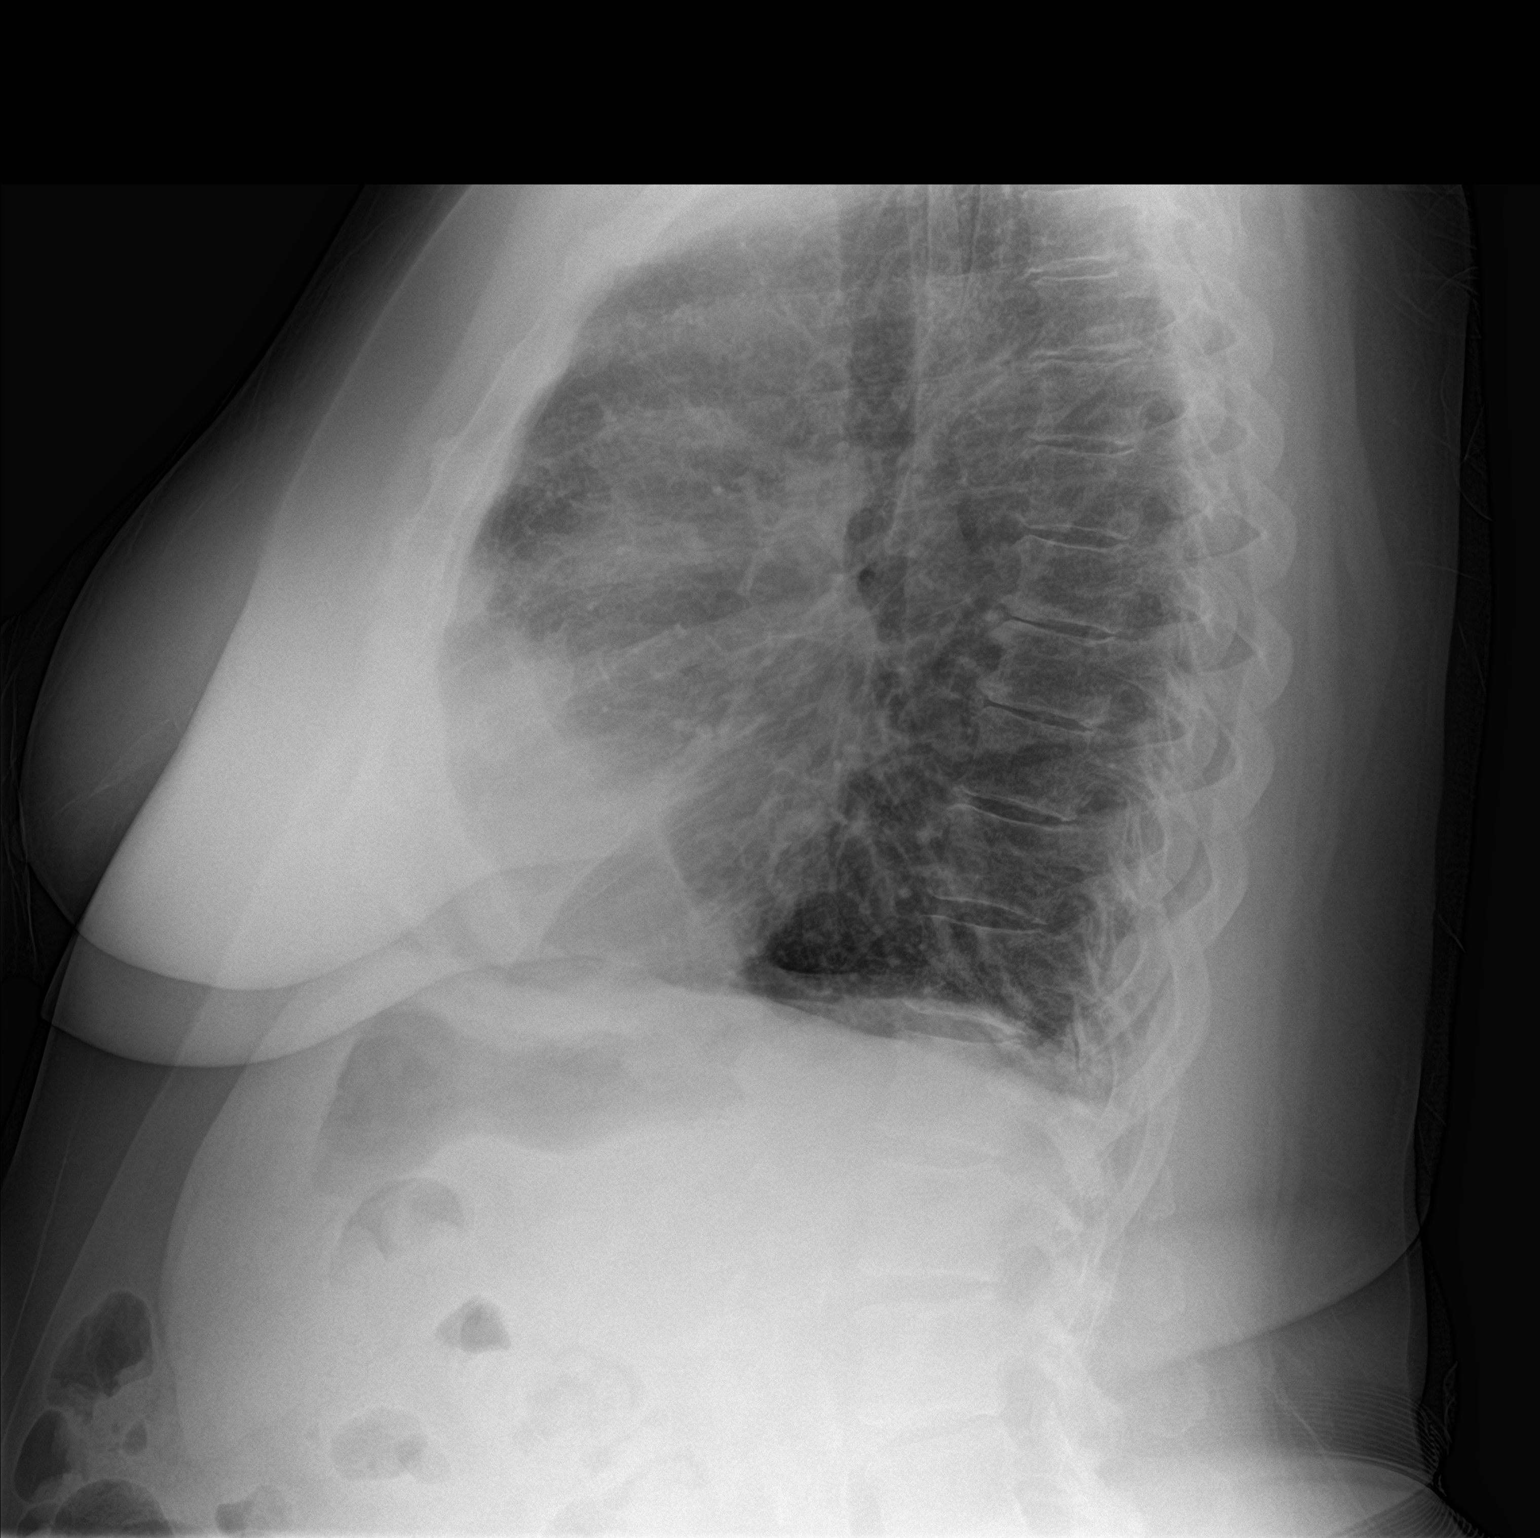

[2 of 2 positions shown; findings below may reference images not displayed]

FINDINGS: The lungs are emphysematous but clear. Heart size is normal. Aortic
atherosclerosis is noted. No pneumothorax or pleural effusion. No
acute bony abnormality.
IMPRESSION: Emphysema without acute disease.

Atherosclerosis.

## 2018-06-22 ENCOUNTER — Ambulatory Visit
Admission: RE | Admit: 2018-06-22 | Discharge: 2018-06-22 | Disposition: A | Discharge status: Home / Self Care | Payer: Medicare HMO | Source: Ambulatory Visit | Attending: Obstetrics and Gynecology | Admitting: Obstetrics and Gynecology

## 2018-06-22 ENCOUNTER — Encounter: Payer: Self-pay | Admitting: Radiology

## 2018-06-22 DIAGNOSIS — Z01419 Encounter for gynecological examination (general) (routine) without abnormal findings: Secondary | ICD-10-CM | POA: Diagnosis present

## 2018-06-22 DIAGNOSIS — J9610 Chronic respiratory failure, unspecified whether with hypoxia or hypercapnia: Secondary | ICD-10-CM | POA: Diagnosis not present

## 2018-06-22 DIAGNOSIS — Z1231 Encounter for screening mammogram for malignant neoplasm of breast: Secondary | ICD-10-CM | POA: Diagnosis not present

## 2018-06-23 IMAGING — DX DG CHEST 1V PORT
1 series · 1 of 1 positions shown · non-contrast
Comparison: None

CLINICAL DATA: Altered mental status.  CVA.

EXAM:
PORTABLE CHEST 1 VIEW

[chest ap]
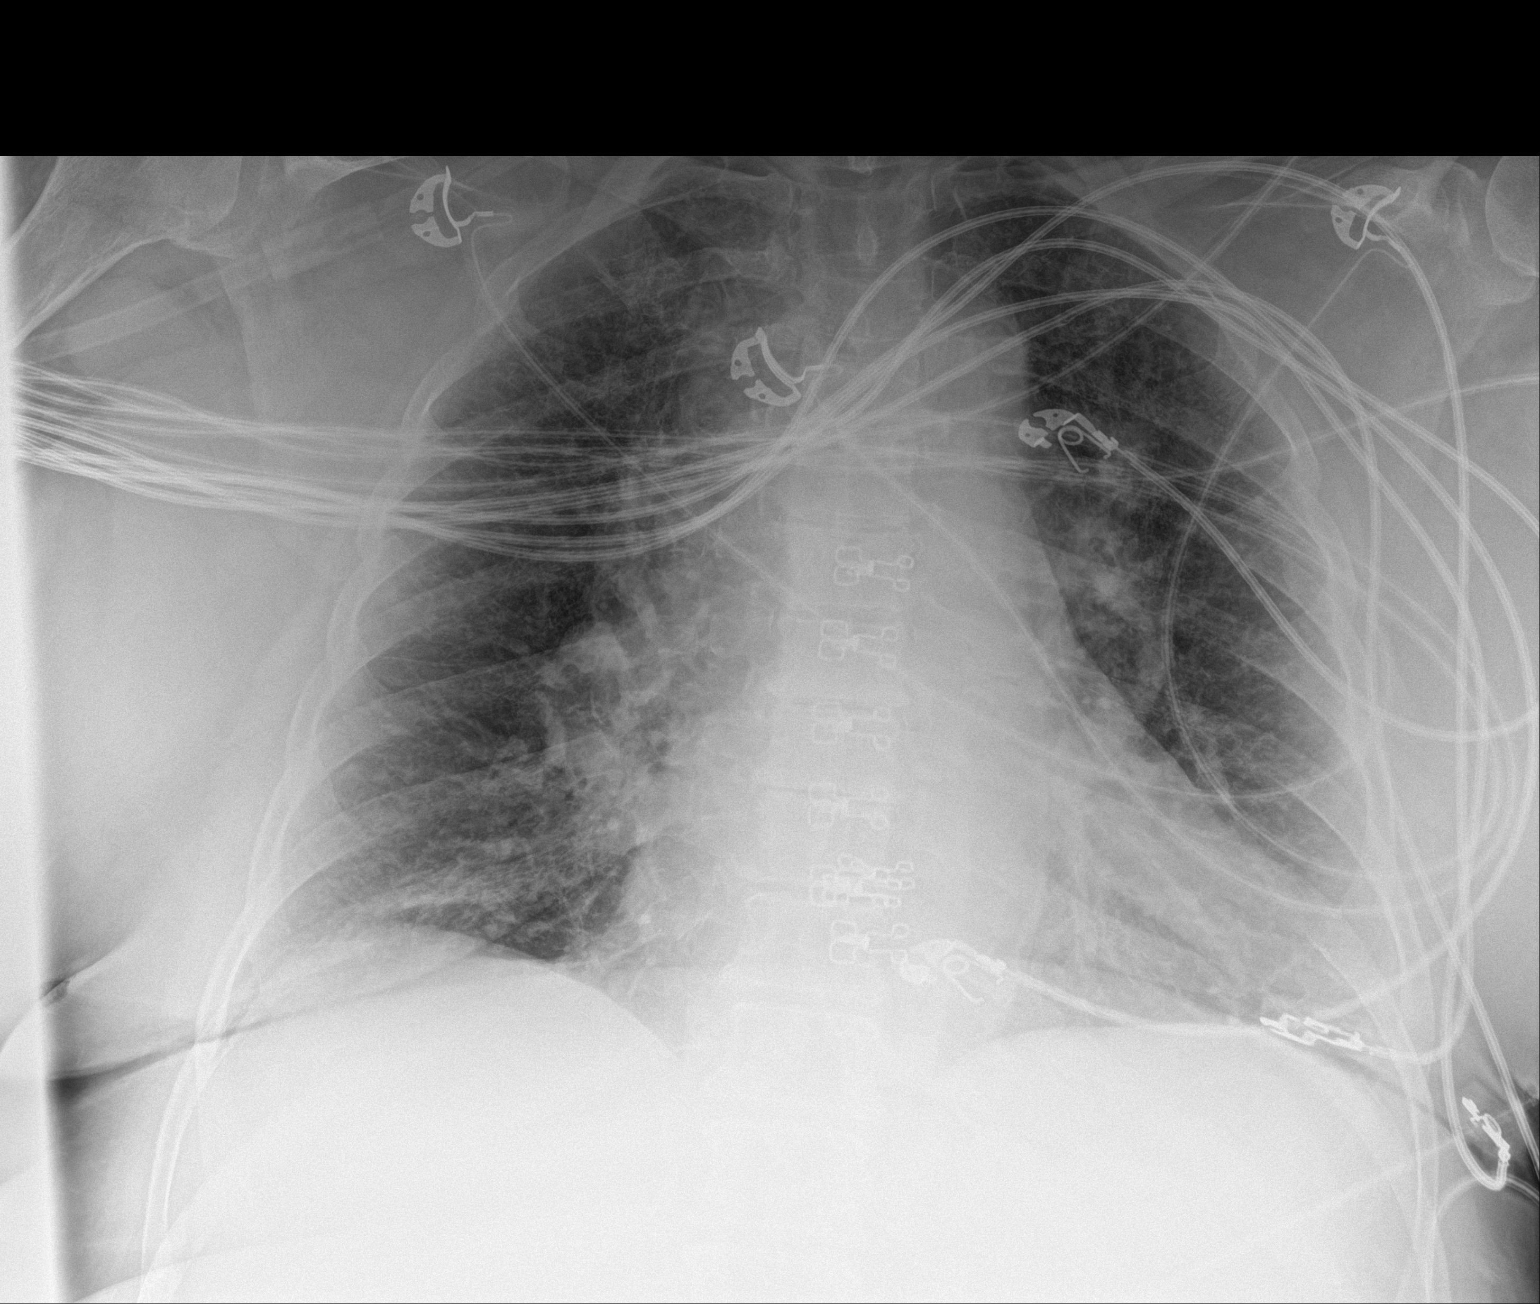

[1 of 1 positions shown; findings below may reference images not displayed]

FINDINGS: UPPER limits normal heart size noted with pulmonary vascular
congestion.

There is no evidence of focal airspace disease, pulmonary edema,
suspicious pulmonary nodule/mass, pleural effusion, or pneumothorax.

No acute bony abnormalities are identified.
IMPRESSION: UPPER limits normal heart size with pulmonary vascular congestion.

## 2018-06-23 IMAGING — CT CT HEAD CODE STROKE
4 series · 16 of 47 positions shown, 18 images · non-contrast
Comparison: None.

CLINICAL DATA: Code stroke. Acute onset of left-sided facial
weakness and facial droop. Last seen normal 4 hours ago. Focal neuro
deficit, less than 6 hours, stroke suspected.

EXAM:
CT HEAD WITHOUT CONTRAST
TECHNIQUE: Contiguous axial images were obtained from the base of the skull
through the vertex without intravenous contrast.

[Series 3: head wo · axial · 0.43mm/px · z∈[+1337,+1447]mm · 6 of 32 slices shown, 8 images]
[im 5/32  brain]
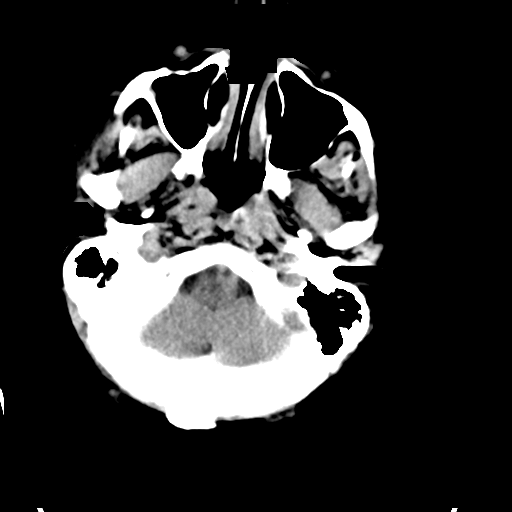
[im 5/32  bone]
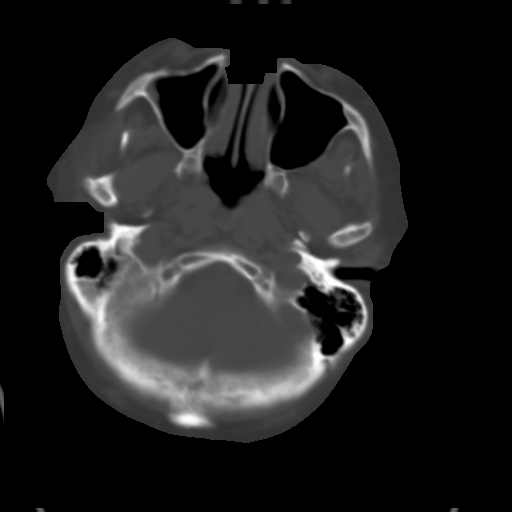
[im 9/32  brain]
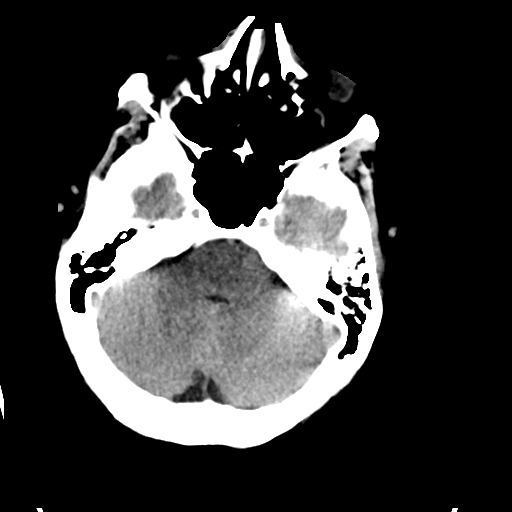
[im 14/32  brain]
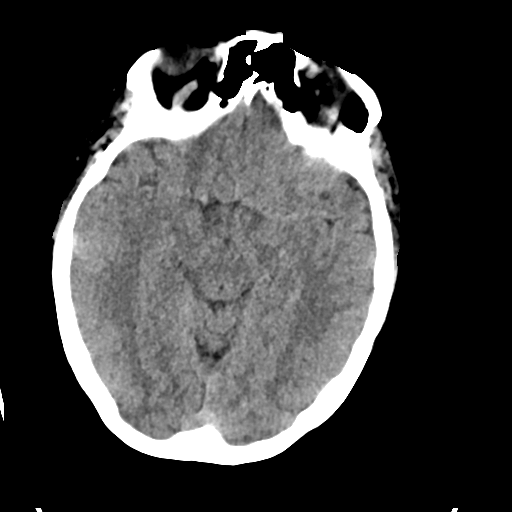
[im 18/32  brain]
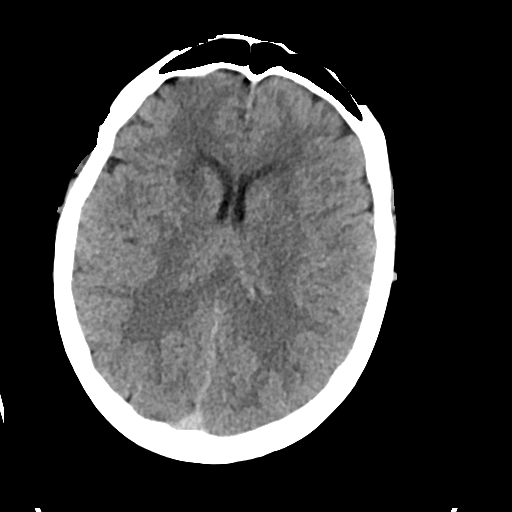
[im 23/32  brain]
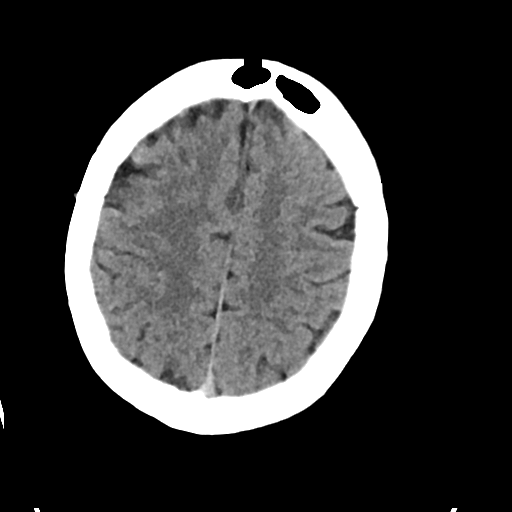
[im 23/32  bone]
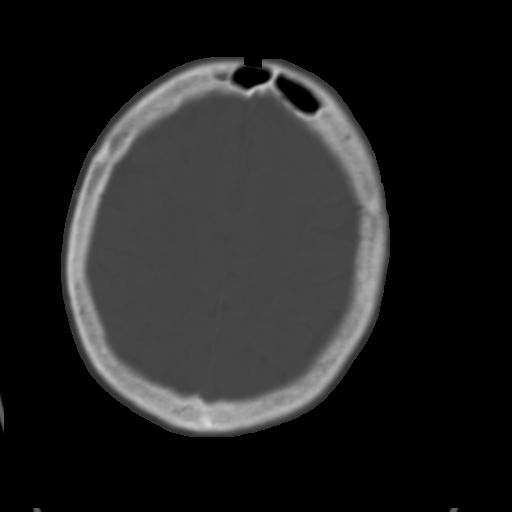
[im 27/32  brain]
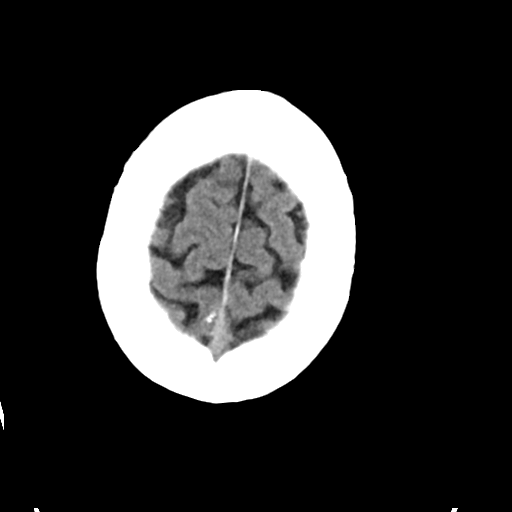

[Series 4: head bone · axial · 0.43mm/px · z∈[+1333,+1389]mm · 4 of 85 slices shown]
[im 9/85  bone]
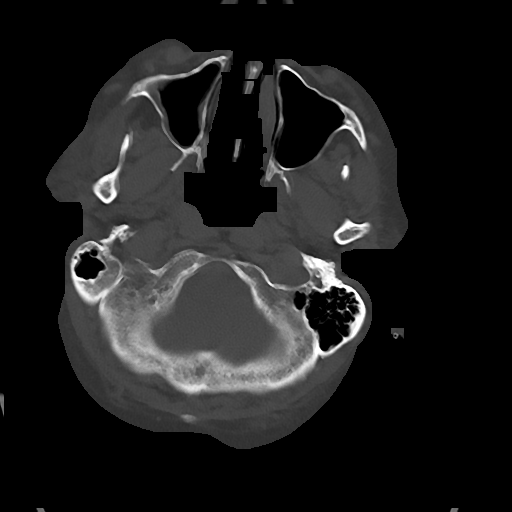
[im 17/85  bone]
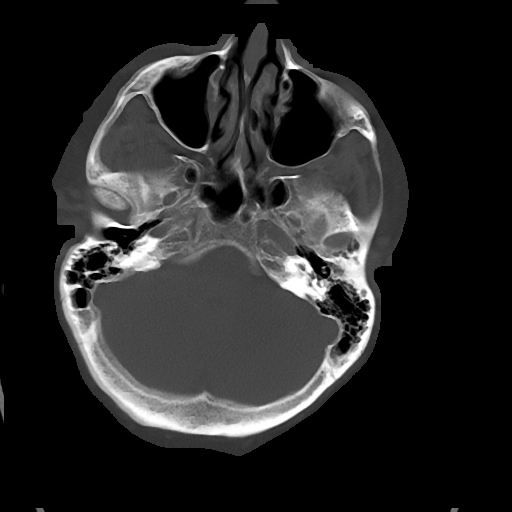
[im 29/85  bone]
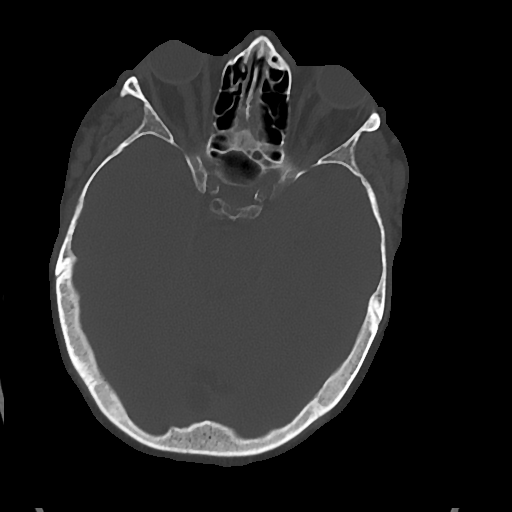
[im 37/85  bone]
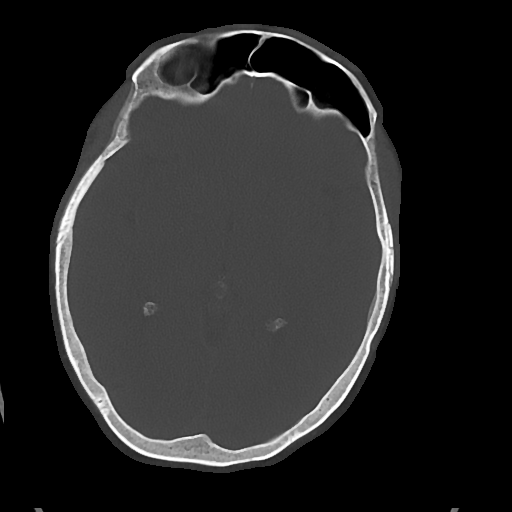

[Series 5: cor soft · coronal · 0.35mm/px · 3 of 70 slices shown]
[im 24/70  brain]
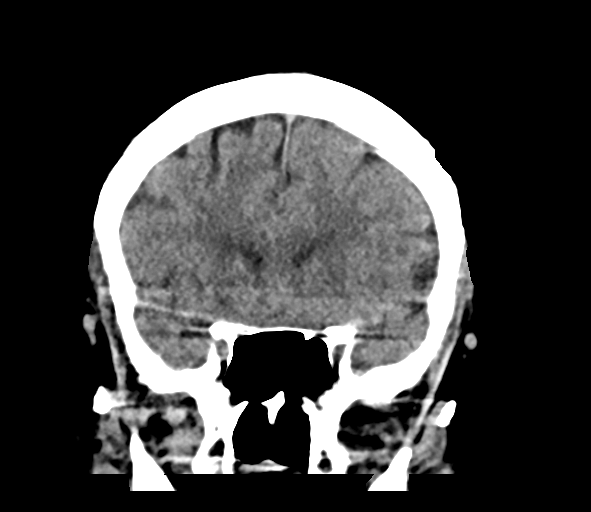
[im 31/70  brain]
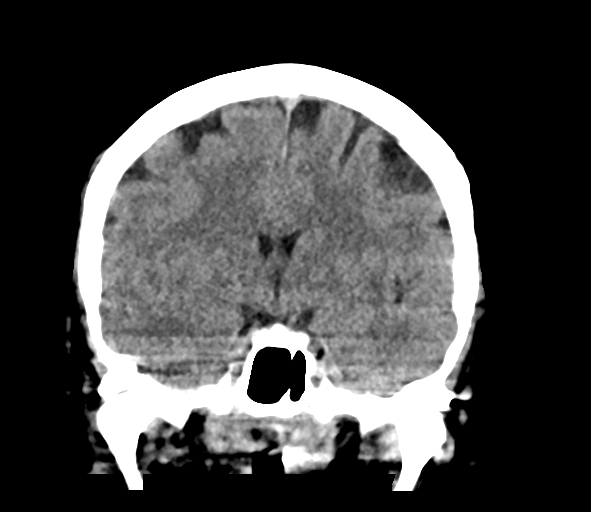
[im 39/70  brain]
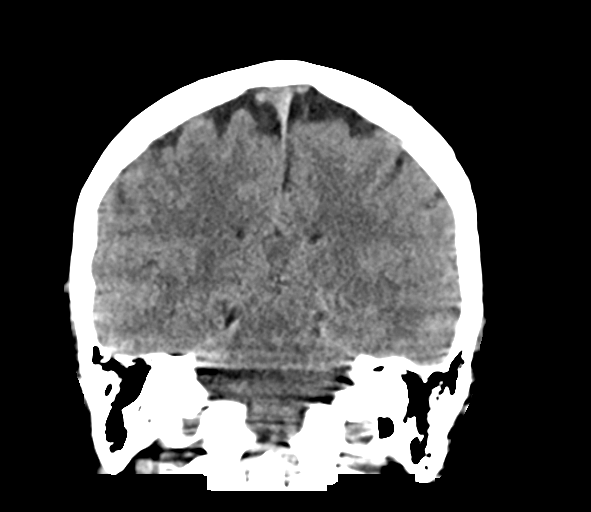

[Series 6: sag soft · sagittal · 0.34mm/px · 3 of 58 slices shown]
[im 20/58  brain]
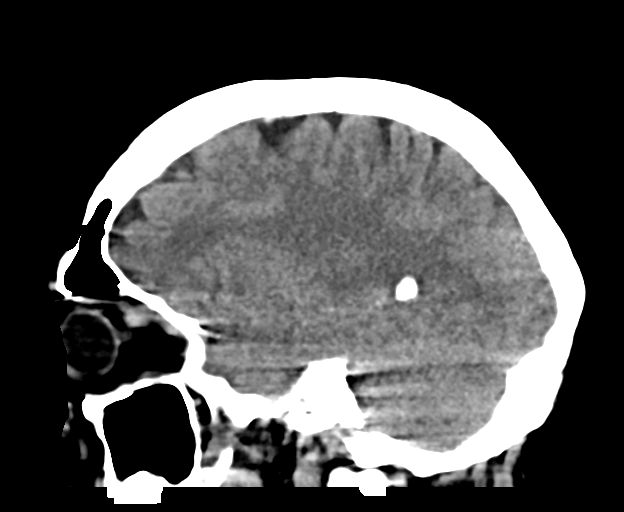
[im 29/58  brain]
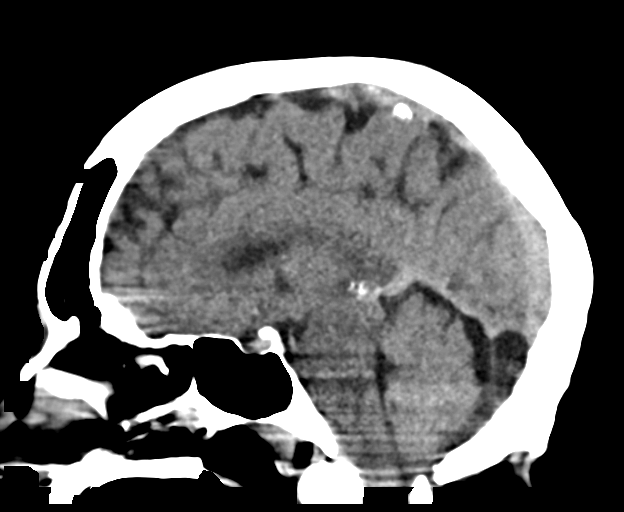
[im 39/58  brain]
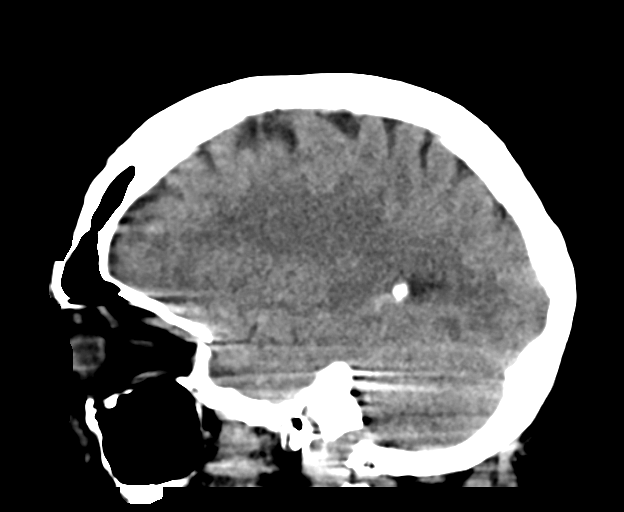

[16 of 47 positions shown; findings below may reference images not displayed]

FINDINGS: Brain: No acute infarct, hemorrhage, or mass lesion is present. Mild
periventricular white matter changes are noted. No other significant
white matter disease is present. The brainstem and cerebellum are
normal. Ventricles are of normal size. No significant extra-axial
fluid collection is present.

Vascular: Vascular calcifications are noted in the cavernous
internal carotid arteries bilaterally. There is no hyperdense
vessel.

Skull: Calvarium is intact. No focal lytic or blastic lesions are
present.

Sinuses/Orbits: The paranasal sinuses and mastoid air cells are
clear. Exophthalmos is noted bilaterally. Globes and orbits are
otherwise within normal limits.

ASPECTS ([place]berta Stroke Program Early CT Score)

- Ganglionic level infarction (caudate, lentiform nuclei, internal
capsule, insula, M1-M3 cortex): [DATE]

- Supraganglionic infarction (M4-M6 cortex): [DATE]

Total score (0-10 with 10 being normal): [DATE]
IMPRESSION: 1. No acute intracranial abnormality.
2. Mild periventricular white matter hypoattenuation likely
represents chronic microvascular disease.
3. Atherosclerosis.
4. ASPECTS is [DATE]

The above was relayed via text pager to Dr. RAMULU on 12/09/2017

## 2018-06-23 IMAGING — CT CT ANGIO HEAD
2 of 7 series · 8 of 33 positions shown · IV contrast (APPLIED)
Comparison: None.

CLINICAL DATA: Acute onset left-sided weakness and facial droop.
Code stroke.

EXAM:
CT ANGIOGRAPHY HEAD AND NECK
TECHNIQUE: Multidetector CT imaging of the head and neck was performed using
the standard protocol during bolus administration of intravenous
contrast. Multiplanar CT image reconstructions and MIPs were
obtained to evaluate the vascular anatomy. Carotid stenosis
measurements (when applicable) are obtained utilizing NASCET
criteria, using the distal internal carotid diameter as the
denominator.
CONTRAST:  100mL VRAD1T-TV9 IOPAMIDOL (VRAD1T-TV9) INJECTION 76%

[Series 7: cta neck/head · axial · 0.48mm/px · z∈[+1238,+1354]mm · 2 of 174 slices shown]
[im 58/174  soft-tissue]
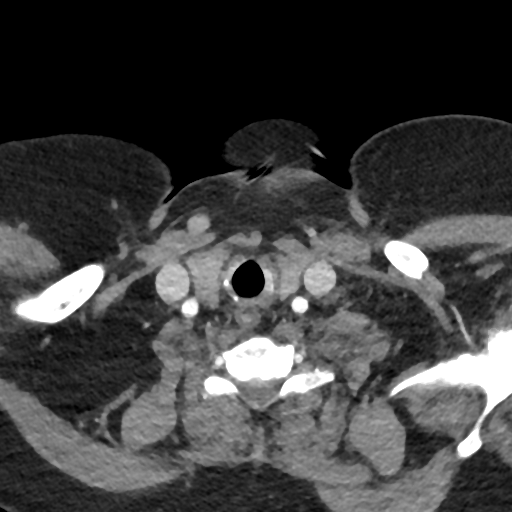
[im 116/174  soft-tissue]
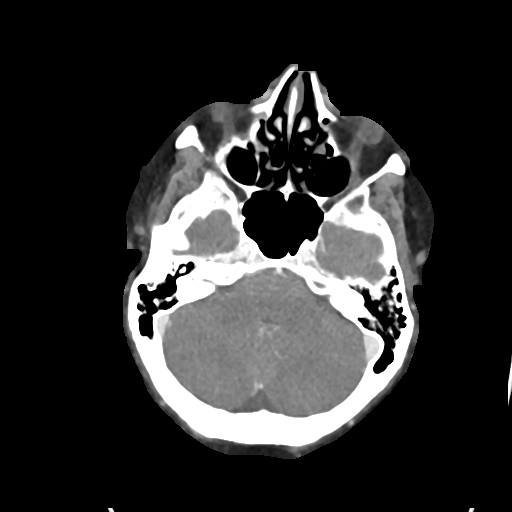

[Series 9: ax thins · axial · 0.39mm/px · z∈[+1163,+1409]mm · 6 of 347 slices shown]
[im 50/347  soft-tissue]
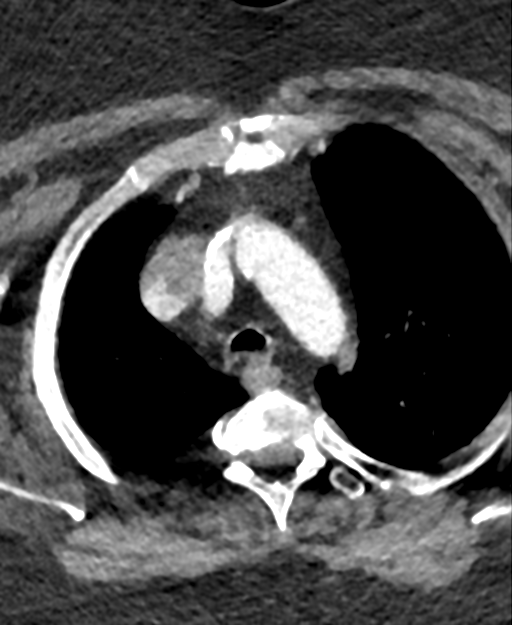
[im 99/347  bone]
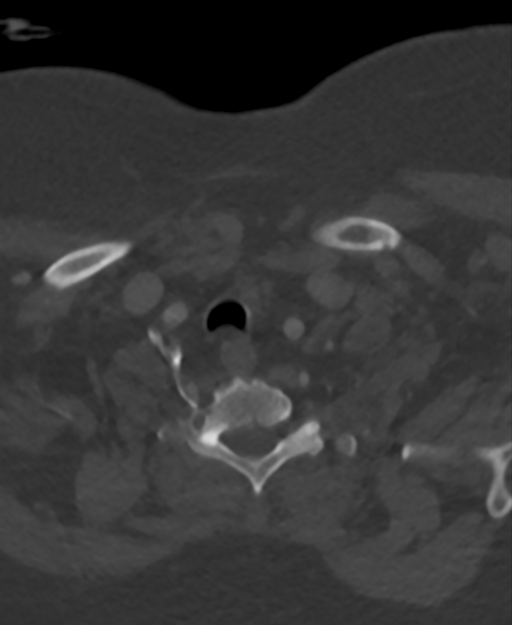
[im 149/347  soft-tissue]
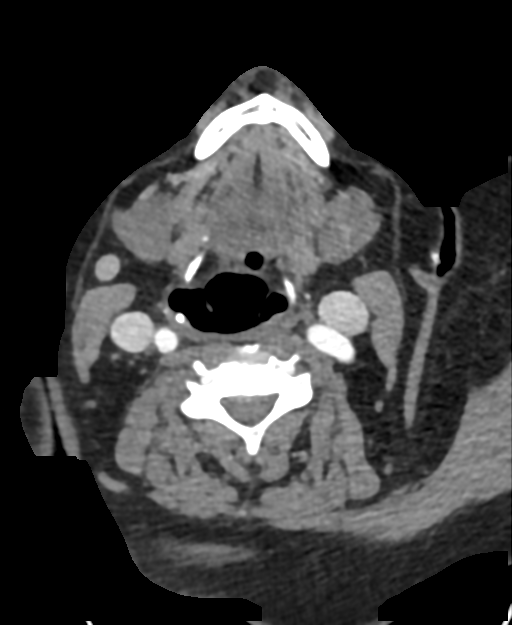
[im 198/347  bone]
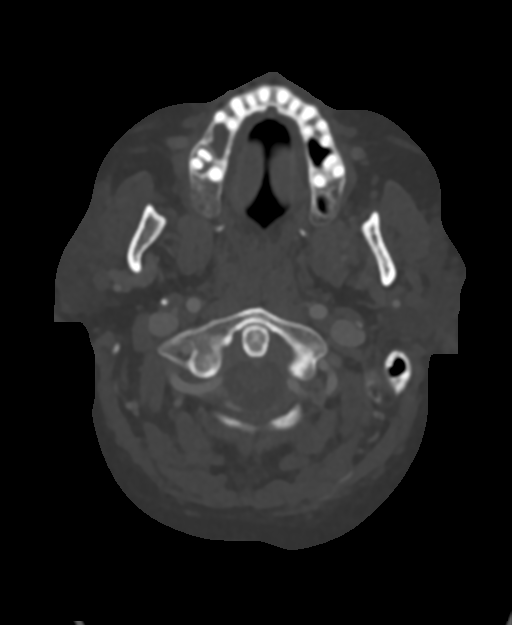
[im 248/347  soft-tissue]
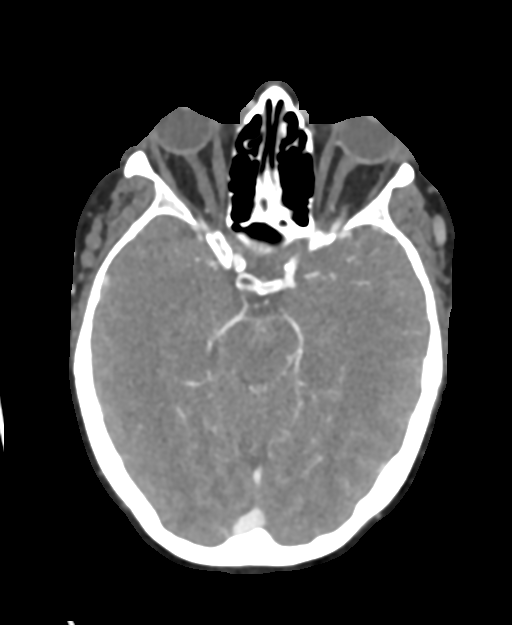
[im 297/347  bone]
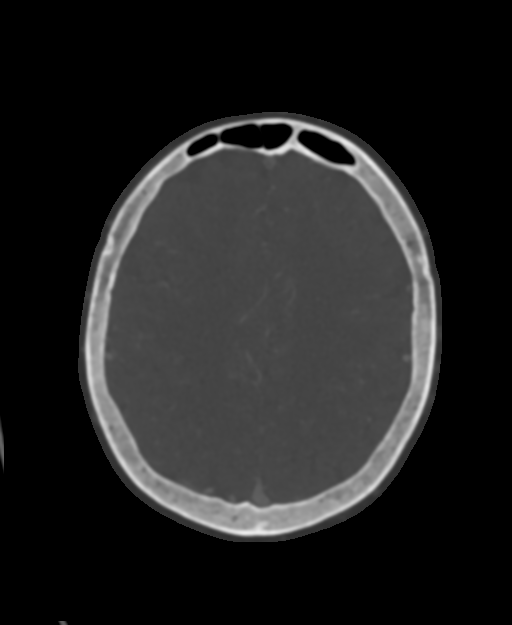

[8 of 33 positions shown; findings below may reference images not displayed]

FINDINGS: CTA NECK FINDINGS

Aortic arch: Atherosclerotic calcifications are present at the
origins of the great vessels. There is no significant stenosis
relative to the more distal vessels. Irregularity is greatest at the
left subclavian artery.

Right carotid system: The right common carotid artery demonstrates
some atherosclerotic change. There is no focal stenosis.
Calcifications are present at the right carotid bifurcation. There
is focal tortuosity just beyond the bifurcation. There is no
significant stenosis.

Left carotid system: The left common carotid artery is within normal
limits. Calcifications are present at the bifurcation without a
significant stenosis. There is proximal tortuosity of the cervical
left ICA without significant stenosis. The more distal cervical left
ICA is normal.

Vertebral arteries: The vertebral arteries originate from the
subclavian arteries bilaterally. There is moderate stenosis with
calcification at the origin of the left vertebral artery. There is
no significant stenosis at the origin of the dominant right
vertebral artery.

Skeleton: Mild degenerative changes are present in the cervical
spine without significant stenosis. Endplate changes are greatest at
C6-7. Vertebral body heights are maintained. No focal lytic or
blastic lesions are present.

Other neck: The soft tissues the neck are otherwise unremarkable. No
focal mucosal or submucosal lesions are present. The thyroid is
normal. No significant adenopathy is present. Salivary glands are
within normal limits.

Upper chest: Centrilobular emphysematous changes are present. Mild
dependent atelectasis is noted. There is no focal nodule or mass
lesion. No significant airspace consolidation is present. The
thoracic inlet is within normal limits.

Review of the MIP images confirms the above findings

CTA HEAD FINDINGS

Anterior circulation: Atherosclerotic calcifications are present
within the cavernous internal carotid arteries bilaterally. There is
no significant stenosis relative to the more distal vessel on either
side through the ICA termini. The A1 and M1 segments are normal. The
anterior communicating artery is patent. MCA bifurcations are
intact. The ACA and MCA branch vessels are within normal limits.

Posterior circulation: The right vertebral artery is slightly
dominant to the left. PICA origins are visualized and normal.
Vertebrobasilar junction is normal. The basilar artery is small,
essentially terminating at the superior cerebellar arteries.
Posterior cerebral arteries originate from the posterior
communicating arteries bilaterally with small P1 segments. The PCA
branch vessels demonstrate mild distal attenuation without a
significant proximal stenosis or occlusion.

Venous sinuses: The dural sinuses are patent. The right transverse
sinus is dominant. The straight sinus and deep cerebral veins are
intact. Cortical veins are within normal limits.

Anatomic variants: Fetal type posterior cerebral arteries
bilaterally.

Review of the MIP images confirms the above findings
IMPRESSION: 1. No emergent large vessel occlusion.
2. Atherosclerotic disease involving the aortic arch, carotid
bifurcations, and cavernous internal carotid arteries without
significant stenosis at these levels.
3. Moderate narrowing of the proximal left vertebral artery at the
subclavian artery.
4. Fetal type posterior cerebral arteries bilaterally with small P1
segments from the basilar tip.
5. Distal small vessel disease evident in the posterior circulation.

These results were called by telephone at the time of interpretation
on 12/09/2017 at [DATE] to Dr. KANOWAH WINEE , who verbally
acknowledged these results.

## 2018-06-24 IMAGING — DX DG ABD PORTABLE 1V
1 series · 1 of 1 positions shown · non-contrast
Comparison: None.

CLINICAL DATA: Line placement.

EXAM:
PORTABLE ABDOMEN - 1 VIEW

[abdomen kub]
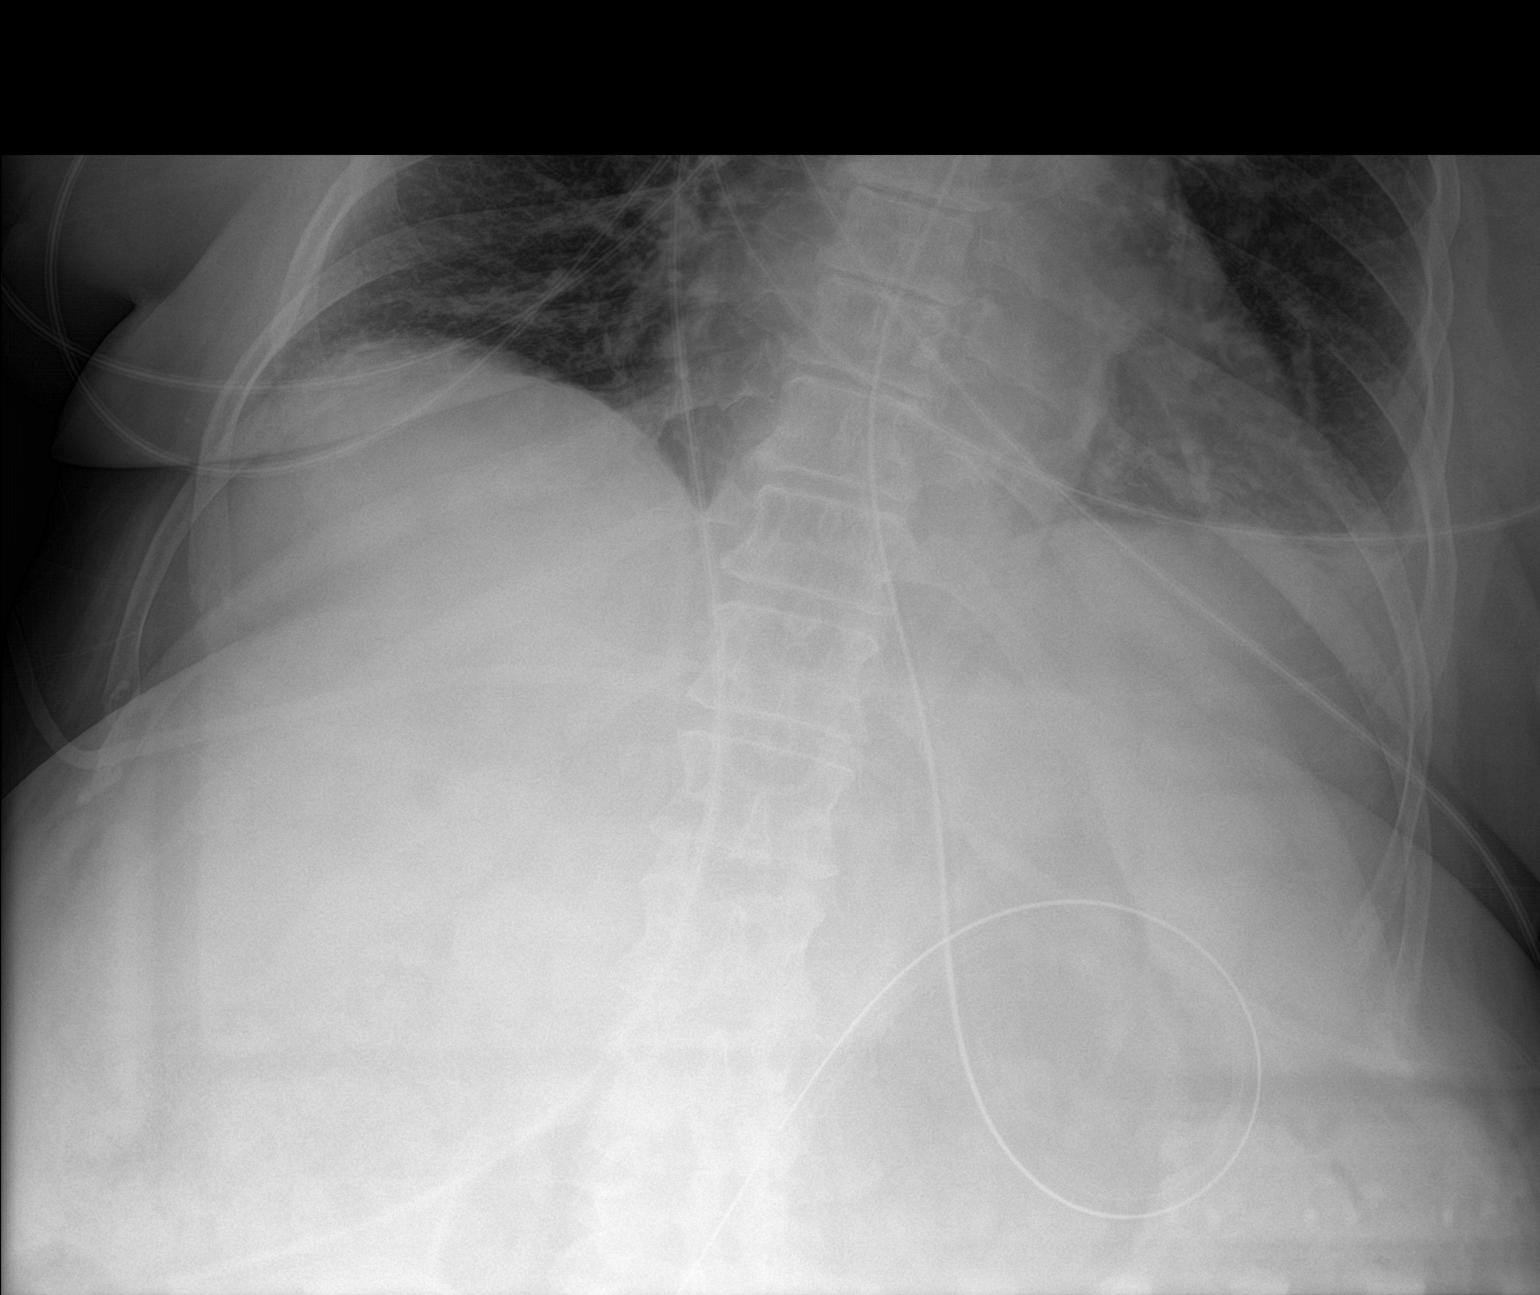

[1 of 1 positions shown; findings below may reference images not displayed]

FINDINGS: Nasogastric tube looped in stomach, distal tip coursing out of view.
Paucity of bowel gas in the included abdomen. No pathologic
calcifications upper abdomen. Soft tissue planes and included
osseous structures are non suspicious.
IMPRESSION: Nasogastric tube tip likely in distal stomach though, out of
field-of-view.

Nonspecific bowel gas pattern.

## 2018-06-27 ENCOUNTER — Telehealth: Payer: Self-pay | Admitting: Family Medicine

## 2018-06-27 NOTE — Telephone Encounter (Signed)
FYI

## 2018-06-27 NOTE — Telephone Encounter (Signed)
Received medical clearance form from [facility], placed form in providers box. Thanks CC

## 2018-07-01 DIAGNOSIS — J9610 Chronic respiratory failure, unspecified whether with hypoxia or hypercapnia: Secondary | ICD-10-CM | POA: Diagnosis not present

## 2018-07-04 DIAGNOSIS — J432 Centrilobular emphysema: Secondary | ICD-10-CM | POA: Diagnosis not present

## 2018-07-04 DIAGNOSIS — J9610 Chronic respiratory failure, unspecified whether with hypoxia or hypercapnia: Secondary | ICD-10-CM | POA: Diagnosis not present

## 2018-07-04 DIAGNOSIS — R0609 Other forms of dyspnea: Secondary | ICD-10-CM | POA: Diagnosis not present

## 2018-07-04 DIAGNOSIS — Z01811 Encounter for preprocedural respiratory examination: Secondary | ICD-10-CM | POA: Diagnosis not present

## 2018-07-04 DIAGNOSIS — R1033 Periumbilical pain: Secondary | ICD-10-CM | POA: Diagnosis not present

## 2018-07-09 ENCOUNTER — Ambulatory Visit: Payer: Medicare HMO | Attending: Family | Admitting: Family

## 2018-07-09 VITALS — BP 105/67 | HR 65 | Resp 18 | Ht 63.0 in | Wt 246.1 lb

## 2018-07-09 DIAGNOSIS — I5032 Chronic diastolic (congestive) heart failure: Secondary | ICD-10-CM | POA: Insufficient documentation

## 2018-07-09 DIAGNOSIS — Z90710 Acquired absence of both cervix and uterus: Secondary | ICD-10-CM | POA: Insufficient documentation

## 2018-07-09 DIAGNOSIS — J449 Chronic obstructive pulmonary disease, unspecified: Secondary | ICD-10-CM | POA: Insufficient documentation

## 2018-07-09 DIAGNOSIS — Z823 Family history of stroke: Secondary | ICD-10-CM | POA: Diagnosis not present

## 2018-07-09 DIAGNOSIS — F17210 Nicotine dependence, cigarettes, uncomplicated: Secondary | ICD-10-CM | POA: Insufficient documentation

## 2018-07-09 DIAGNOSIS — Z7982 Long term (current) use of aspirin: Secondary | ICD-10-CM | POA: Diagnosis not present

## 2018-07-09 DIAGNOSIS — G5600 Carpal tunnel syndrome, unspecified upper limb: Secondary | ICD-10-CM | POA: Insufficient documentation

## 2018-07-09 DIAGNOSIS — R140 Abdominal distension (gaseous): Secondary | ICD-10-CM | POA: Insufficient documentation

## 2018-07-09 DIAGNOSIS — I110 Hypertensive heart disease with heart failure: Secondary | ICD-10-CM | POA: Diagnosis not present

## 2018-07-09 DIAGNOSIS — E669 Obesity, unspecified: Secondary | ICD-10-CM | POA: Diagnosis not present

## 2018-07-09 DIAGNOSIS — Z8249 Family history of ischemic heart disease and other diseases of the circulatory system: Secondary | ICD-10-CM | POA: Insufficient documentation

## 2018-07-09 DIAGNOSIS — M545 Low back pain: Secondary | ICD-10-CM | POA: Diagnosis not present

## 2018-07-09 DIAGNOSIS — Z79890 Hormone replacement therapy (postmenopausal): Secondary | ICD-10-CM | POA: Insufficient documentation

## 2018-07-09 DIAGNOSIS — J410 Simple chronic bronchitis: Secondary | ICD-10-CM

## 2018-07-09 DIAGNOSIS — Z87442 Personal history of urinary calculi: Secondary | ICD-10-CM | POA: Diagnosis not present

## 2018-07-09 DIAGNOSIS — Z885 Allergy status to narcotic agent status: Secondary | ICD-10-CM | POA: Diagnosis not present

## 2018-07-09 DIAGNOSIS — Z9981 Dependence on supplemental oxygen: Secondary | ICD-10-CM | POA: Diagnosis not present

## 2018-07-09 DIAGNOSIS — F329 Major depressive disorder, single episode, unspecified: Secondary | ICD-10-CM | POA: Insufficient documentation

## 2018-07-09 DIAGNOSIS — Z888 Allergy status to other drugs, medicaments and biological substances status: Secondary | ICD-10-CM | POA: Insufficient documentation

## 2018-07-09 DIAGNOSIS — Z801 Family history of malignant neoplasm of trachea, bronchus and lung: Secondary | ICD-10-CM | POA: Insufficient documentation

## 2018-07-09 DIAGNOSIS — Z79899 Other long term (current) drug therapy: Secondary | ICD-10-CM | POA: Diagnosis not present

## 2018-07-09 DIAGNOSIS — F419 Anxiety disorder, unspecified: Secondary | ICD-10-CM | POA: Insufficient documentation

## 2018-07-09 DIAGNOSIS — G8929 Other chronic pain: Secondary | ICD-10-CM | POA: Insufficient documentation

## 2018-07-09 DIAGNOSIS — Z9889 Other specified postprocedural states: Secondary | ICD-10-CM | POA: Insufficient documentation

## 2018-07-09 DIAGNOSIS — Z79891 Long term (current) use of opiate analgesic: Secondary | ICD-10-CM | POA: Insufficient documentation

## 2018-07-09 DIAGNOSIS — Z803 Family history of malignant neoplasm of breast: Secondary | ICD-10-CM | POA: Diagnosis not present

## 2018-07-09 DIAGNOSIS — I10 Essential (primary) hypertension: Secondary | ICD-10-CM

## 2018-07-09 DIAGNOSIS — Z720 Tobacco use: Secondary | ICD-10-CM

## 2018-07-09 MED ORDER — TORSEMIDE 20 MG PO TABS: 40.0000 mg | ORAL_TABLET | Freq: Two times a day (BID) | ORAL | 3 refills | Status: DC

## 2018-07-09 NOTE — Patient Instructions (Addendum)
Continue weighing daily and call for an overnight weight gain of > 2 pounds or a weekly weight gain of >5 pounds.  Stop furosemide.  Begin torsemide 98m twice daily (2 tablets twice daily)

## 2018-07-09 NOTE — Progress Notes (Signed)
Patient ID: Theresa [name], female    DOB: 27-Nov-1955, 62 y.o.   MRN: [id number]  HPI Theresa [name] is a 62 y/o female with a history of chronic low back pain, HTN, depression, COPD on long-term oxygen, anxiety, carpal tunnel syndrome, long-standing tobacco use and chronic heart failure.  Reviewed echo report on 12/11/17 which showed an EF of 55-60% along with trivial AR and an elevated PA pressure of 58 mm Hg. Echo done 11/03/15 but unable to access results. Echo done on 02/19/15 showed an EF of 50% without valvular stenosis. Recent PFT's done on 01/18/16  Admitted 02/07/18 due to acute on chronic COPD. Nebulizer and IV solu-medrol were given. Discharged after 3 days.   She presents today for a follow-up visit with a chief complaint of minimal shortness of breath upon moderate exertion. She describes this as chronic in nature having been present for several years. She has associated fatigue, cough, wheezing, abdominal distention, chronic back pain, difficulty sleeping and gradual weight gain along with this. She denies any palpitations, pedal edema, chest pain or dizziness.   Past Medical History:  Diagnosis Date  . Acute postoperative pain 10/03/2017  . Anxiety   . Carpal tunnel syndrome   . CHF (congestive heart failure) ([place])    1/18  . CHF (congestive heart failure) ([place])   . Chronic generalized abdominal pain   . Chronic rhinitis   . COPD (chronic obstructive pulmonary disease) ([place])   . DDD (degenerative disc disease), cervical   . Depression   . Edema   . Flu    1/18  . Gastritis   . Hematuria   . Hernia, abdominal   . Hypertension   . Kidney stones   . Low back pain   . Lumbar radiculitis   . Malodorous urine   . Muscle weakness   . Obesity   . Oxygen dependent   . Renal cyst   . Sensory urge incontinence   . Tobacco abuse    Past Surgical History:  Procedure Laterality Date  . ABDOMINAL HYSTERECTOMY    . CARPAL TUNNEL RELEASE Left   . TONSILLECTOMY    . TUBAL LIGATION      Family History  Problem Relation Age of Onset  . Heart disease Mother   . Stroke Mother   . Coronary artery disease Mother   . Lung cancer Sister   . Cancer Sister   . Breast cancer Sister   . Alcohol abuse Father   . Heart disease Father   . Cancer Brother   . Cancer Brother   . Pneumonia Brother   . Prostate cancer Neg Hx   . Kidney cancer Neg Hx   . Bladder Cancer Neg Hx    Social History   Tobacco Use  . Smoking status: Current Every Day Smoker    Packs/day: 1.00    Years: 44.00    Pack years: 44.00    Types: Cigarettes  . Smokeless tobacco: Never Used  Substance Use Topics  . Alcohol use: No    Frequency: Never   Allergies  Allergen Reactions  . Codeine Nausea And Vomiting  . Meloxicam Other (See Comments)    Stomach pain   Prior to Admission medications   Medication Sig Start Date End Date Taking? Authorizing Provider  ADVAIR DISKUS 250-50 MCG/DOSE AEPB Inhale 1 puff into the lungs 2 (two) times daily. 11/29/17  Yes [provider]  ALPRAZolam Duanne Moron) 0.5 MG tablet Take 1 tablet (0.5 mg total) by mouth 2 (  two) times daily as needed for anxiety. 12/09/16  Yes Darylene Price A, FNP  aspirin 81 MG tablet Take 81 mg by mouth daily.   Yes [provider]  bisoprolol-hydrochlorothiazide (ZIAC) 10-6.25 MG tablet Take 1 tablet by mouth daily. 05/02/18  Yes Jerrol Banana., MD  Cholecalciferol ([employer] D3) 2000 units CAPS Take 1 capsule by mouth daily.   Yes [provider]  DALIRESP 500 MCG TABS tablet Take 500 mcg by mouth daily. 12/05/17  Yes [provider]  furosemide (LASIX) 40 MG tablet Take 1 tablet (40 mg total) by mouth 2 (two) times daily. 04/26/18  Yes Darylene Price A, FNP  gabapentin (NEURONTIN) 300 MG capsule Take 1 capsule (300 mg total) by mouth 4 (four) times daily. Follow written titration schedule. 05/02/18 07/31/18 Yes Vevelyn Francois, NP  HYDROcodone-acetaminophen (NORCO/VICODIN) 5-325 MG tablet Take 1 tablet by mouth  every 8 (eight) hours as needed for severe pain. 07/20/18 08/19/18 Yes Vevelyn Francois, NP  HYDROcodone-acetaminophen (NORCO/VICODIN) 5-325 MG tablet Take 1 tablet by mouth every 8 (eight) hours as needed for severe pain. 06/20/18 07/20/18 Yes King, Diona Foley, NP  ipratropium-albuterol (DUONEB) 0.5-2.5 (3) MG/3ML SOLN Inhale 3 mLs into the lungs every 6 (six) hours as needed for shortness of breath. 11/15/17  Yes [provider]  levothyroxine (SYNTHROID, LEVOTHROID) 25 MCG tablet Take 1 tablet (25 mcg total) by mouth daily before breakfast. 05/02/18  Yes Jerrol Banana., MD  OXYGEN Inhale 2.5 L into the lungs continuous. Reported on 11/03/2015   Yes [provider]  potassium chloride SA (K-DUR,KLOR-CON) 20 MEQ tablet Take 1 tablet (20 mEq total) by mouth 2 (two) times daily. 04/26/18  Yes Hackney, Otila Kluver A, FNP  ranitidine (ZANTAC) 150 MG tablet Take 150 mg by mouth 2 (two) times daily.   Yes [provider]  sertraline (ZOLOFT) 100 MG tablet Take 2 tablets (200 mg total) by mouth every morning. 04/20/18  Yes Chrismon, Vickki Muff, PA  VENTOLIN HFA 108 (90 Base) MCG/ACT inhaler Inhale 2 puffs into the lungs every 6 (six) hours as needed for shortness of breath. 02/10/18  Yes Fritzi Mandes, MD    Review of Systems  Constitutional: Positive for fatigue. Negative for appetite change.  HENT: Negative for congestion, postnasal drip and sore throat.   Eyes: Negative.   Respiratory: Positive for cough, shortness of breath and wheezing. Negative for chest tightness.   Cardiovascular: Negative for chest pain, palpitations and leg swelling.  Gastrointestinal: Positive for abdominal distention. Negative for abdominal pain and nausea.  Endocrine: Negative.   Genitourinary: Negative.   Musculoskeletal: Positive for back pain. Negative for neck pain.  Skin: Negative.   Allergic/Immunologic: Negative.   Neurological: Negative for dizziness and light-headedness.  Hematological: Negative for  adenopathy. Bruises/bleeds easily.  Psychiatric/Behavioral: Positive for dysphoric mood and sleep disturbance (wearing oxygen & bipap at night). Negative for agitation and suicidal ideas. The patient is nervous/anxious.    Vitals:   07/09/18 1334  BP: 105/67  Pulse: 65  Resp: 18  SpO2: 94%  Weight: 246 lb 2 oz (111.6 kg)  Height: _0  (1.6 m)   Wt Readings from Last 3 Encounters:  07/09/18 246 lb 2 oz (111.6 kg)  05/21/18 248 lb (112.5 kg)  05/08/18 241 lb 3.2 oz (109.4 kg)   Lab Results  Component Value Date   CREATININE 0.70 03/01/2018   CREATININE 0.59 02/08/2018   CREATININE 0.62 02/07/2018    Physical Exam  Constitutional: She is  oriented to person, place, and time. She appears well-developed and well-nourished.  HENT:  Head: Normocephalic and atraumatic.  Neck: Normal range of motion. Neck supple. No JVD present.  Cardiovascular: Normal rate and regular rhythm.  Pulmonary/Chest: Effort normal. She has wheezes in the right lower field and the left lower field. She has no rales.  Abdominal: Soft. She exhibits distension. There is no tenderness.  Musculoskeletal: She exhibits no edema or tenderness.  Neurological: She is alert and oriented to person, place, and time.  Skin: Skin is warm and dry.  Psychiatric: Her behavior is normal. Thought content normal. Her mood appears anxious. She does not exhibit a depressed mood.  Nursing note and vitals reviewed.     Assessment & Plan:  1: Chronic heart failure with preserved ejection fraction- - NYHA class III - minimally fluid overloaded today with abdominal distention and weight gain - continues to weigh daily at home. Reminded to call for an overnight weight gain of >2 pounds or a weekly weight gain of >5 pounds - weight up 9 pounds since she was last here 4 months ago - will d/c furosemide and begin torsemide 63m BID - get BMP on 07/31/18 when she comes to pain management appointment - monitor sodium content  carefully - saw cardiologist (Clayborn Bigness 03/29/18  - does not fit criteria for REDS vest reading due to elevated BMI - BNP 12/10/17 was 333.4  2: HTN- - BP looks good today although on the low side - saw PCP (Chrismon) 03/01/18 - BMP from 03/01/18 reviewed and showed sodium 143, potassium 4.2 and GFR 94  3: COPD- - wearing bipap nightly - oxygen at 2.5L at bedtime and then as needed during the day - last saw pulmonology (Raul Del on 07/04/18 - using nebulizer  4: Tobacco use- - alternates between the lozenges, gum and actual cigarettes - removes herself from the oxygen when smoking - complete cessation discussed for 3 minutes with her  Patient did not bring her medications nor a list. Each medication was verbally reviewed with the patient and she was encouraged to bring the bottles to every visit to confirm accuracy of list.  Return in 3 months or sooner for any questions/problems before then.

## 2018-07-10 ENCOUNTER — Encounter: Payer: Self-pay | Admitting: Family

## 2018-07-17 DIAGNOSIS — K439 Ventral hernia without obstruction or gangrene: Secondary | ICD-10-CM

## 2018-07-17 HISTORY — DX: Ventral hernia without obstruction or gangrene: K43.9

## 2018-07-19 ENCOUNTER — Other Ambulatory Visit: Payer: Self-pay | Admitting: Family Medicine

## 2018-07-21 IMAGING — US US PELVIS COMPLETE
1 series · 13 of 25 positions shown · non-contrast
Comparison: Noncontrast abdominopelvic CT scan October 31, 2016

CLINICAL DATA: Right lower quadrant cystic adnexal mass on CT scan
October 31, 2016.

EXAM:
TRANSABDOMINAL AND TRANSVAGINAL ULTRASOUND OF PELVIS
TECHNIQUE: Both transabdominal and transvaginal ultrasound examinations of the
pelvis were performed. Transabdominal technique was performed for
global imaging of the pelvis including uterus, ovaries, adnexal
regions, and pelvic cul-de-sac. It was necessary to proceed with
endovaginal exam following the transabdominal exam to visualize the
ovaries and adnexal regions.

[Series 1: us pelvis complete · 0.24mm/px · 13 of 77 slices shown]
[im 1/77]
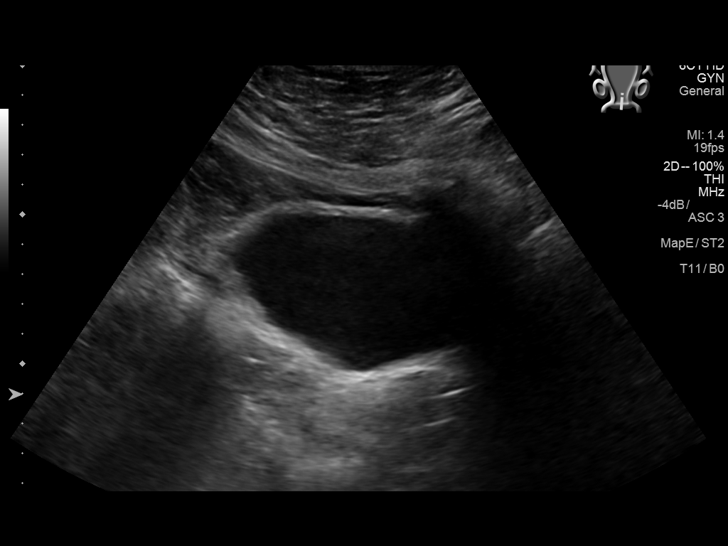
[im 7/77]
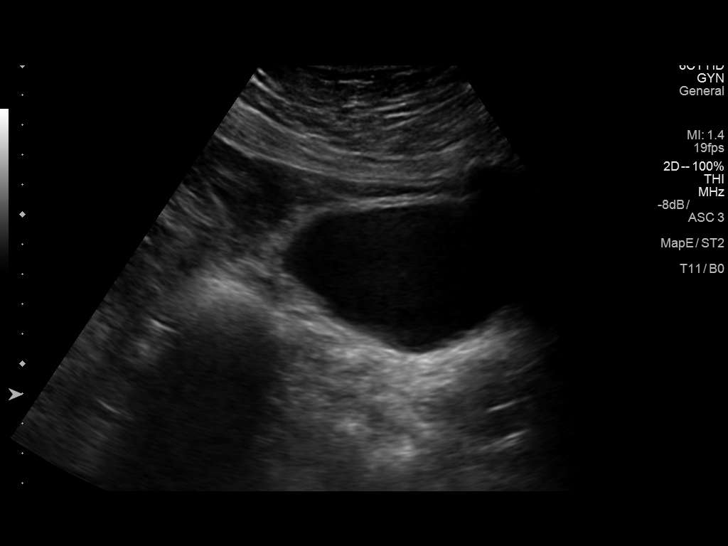
[im 13/77]
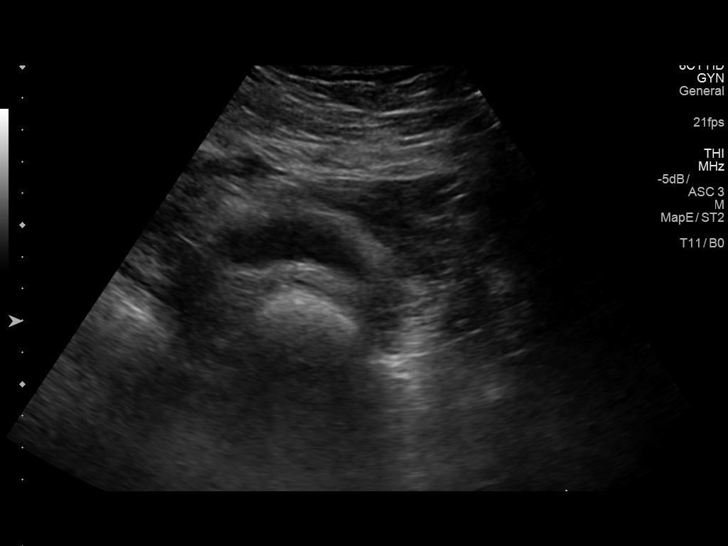
[im 20/77]
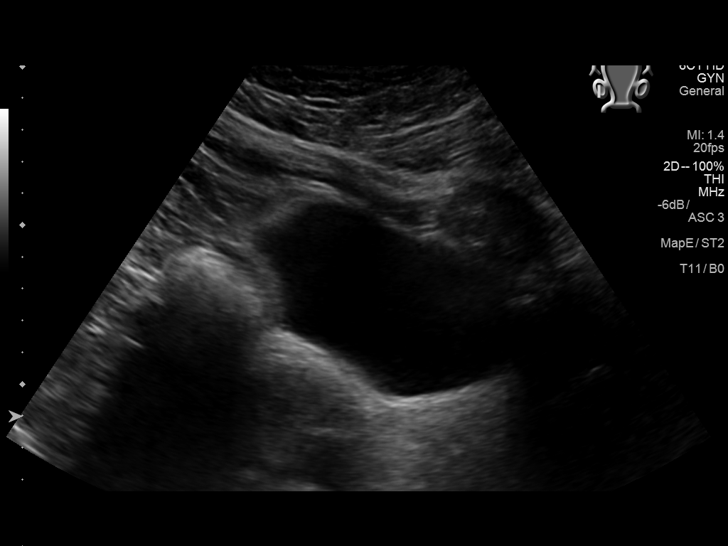
[im 26/77]
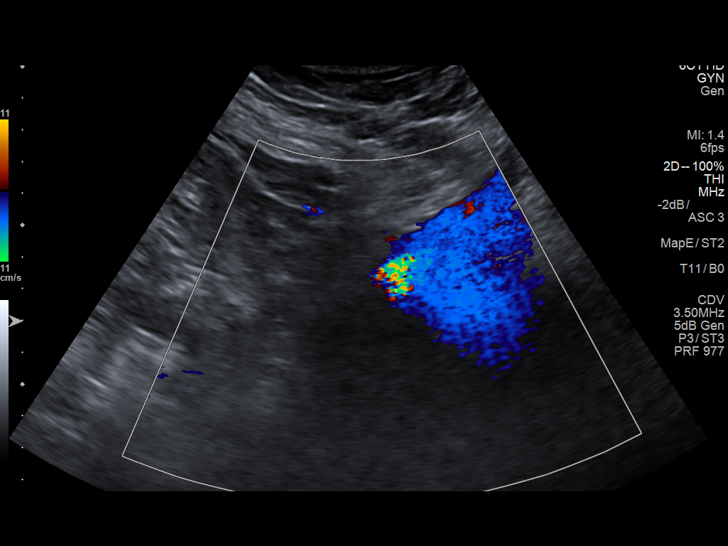
[im 32/77]
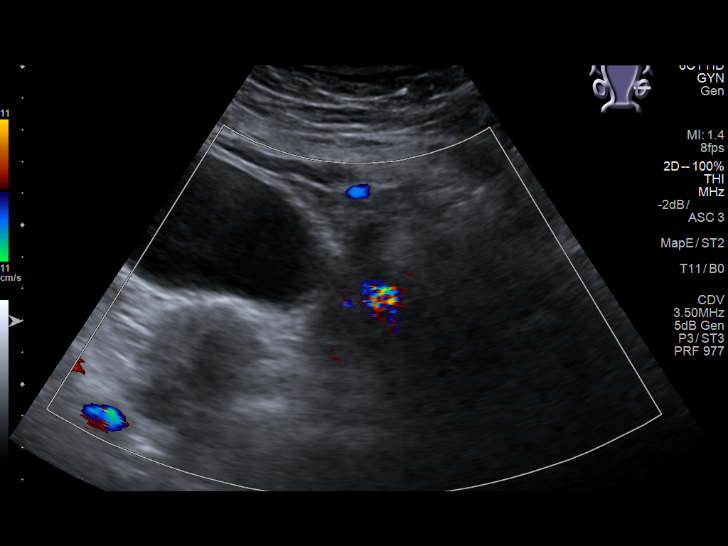
[im 39/77]
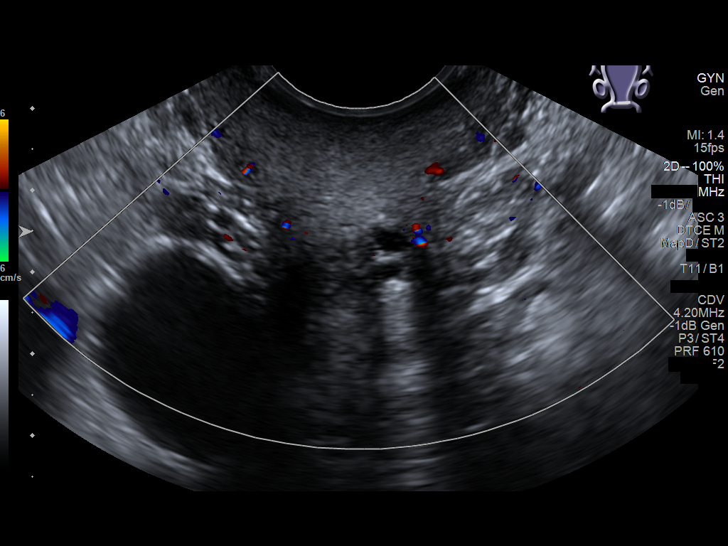
[im 45/77]
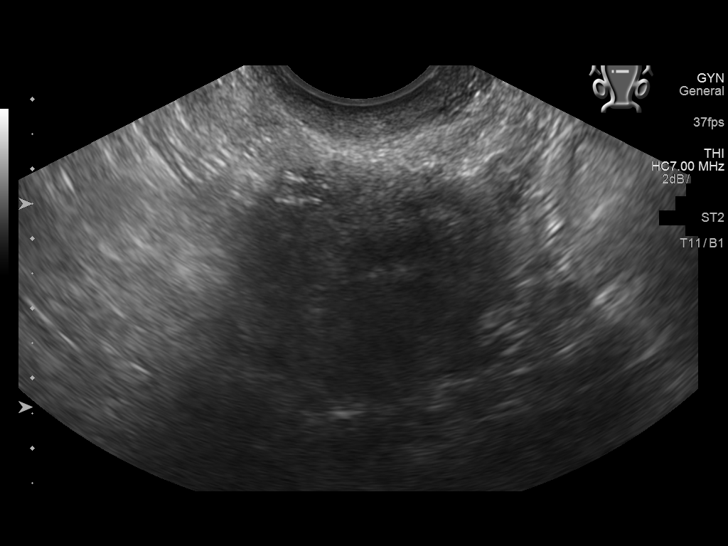
[im 51/77]
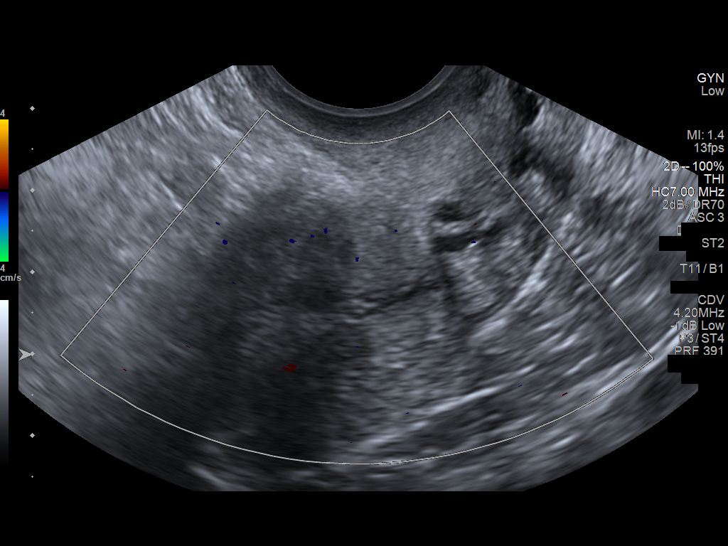
[im 58/77]
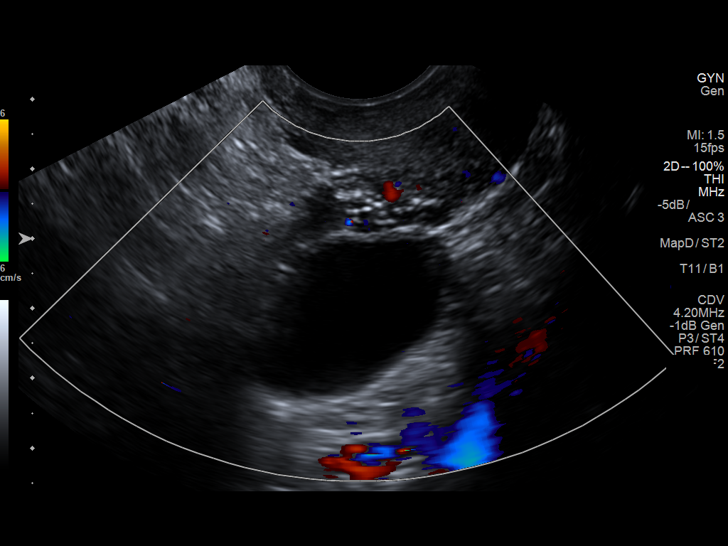
[im 64/77]
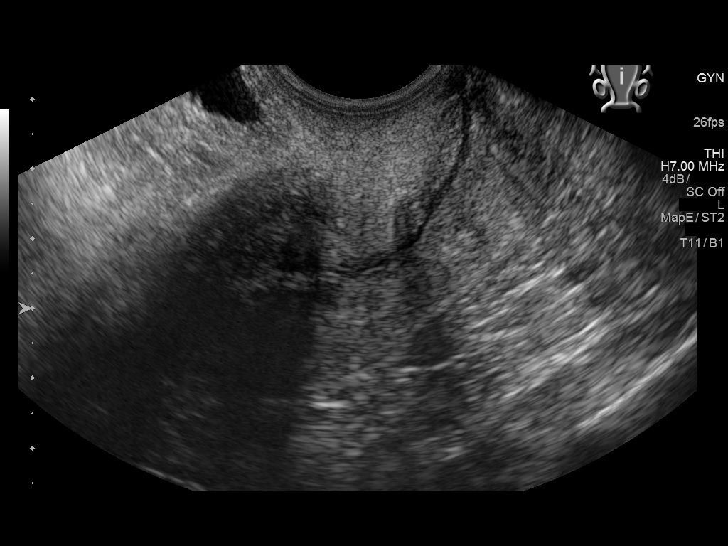
[im 70/77]
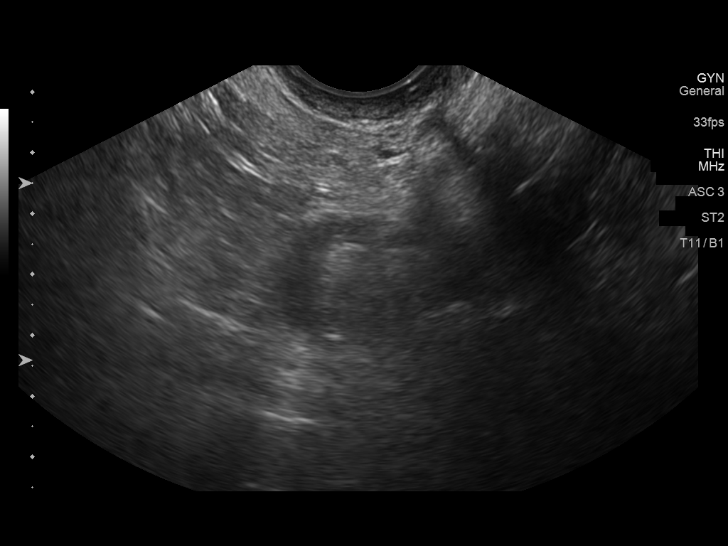
[im 77/77]
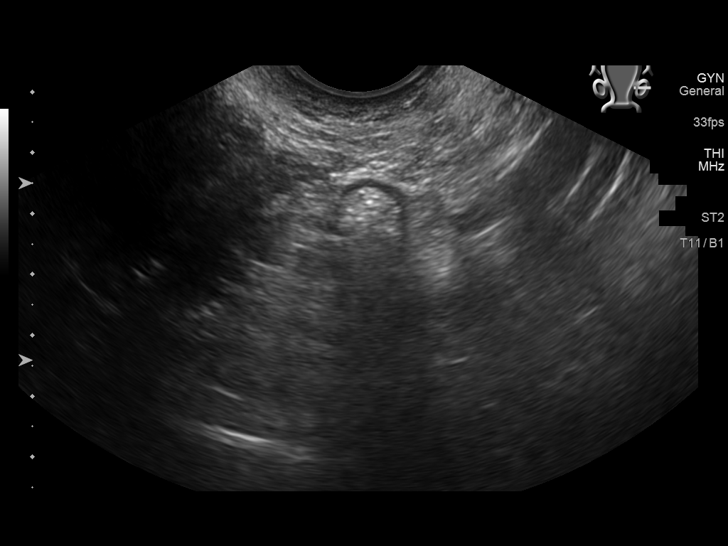

[13 of 25 positions shown; findings below may reference images not displayed]

FINDINGS: The patient's clinical history and surgical history is confusing.

Uterus

The uterus is visualized. It measures 7.1 x 3.4 x 3.8 cm. No
fibroids are observed.

Endometrium: The endometrium measures 7.1 cm in thickness. No
endometrial fluid collections or masses are observed.

Right ovary

The right ovary could not be demonstrated and may be surgically
absent. There is a simple appearing right adnexal cystic structure
measuring 3.3 x 1.9 x 2.3 cm.

Left ovary

The left ovary is not demonstrated and may be surgically absent. No
cystic or solid left adnexal mass is observed.

Other findings

No abnormal free fluid.
IMPRESSION: Simple appearing cystic right adnexal mass new since a CT scan of
the abdomen and pelvis of July 21, 2014. No normal right ovarian
tissue is observed. The left ovary is also not demonstrated. The
patient reports having had at least 1 ovary removed. Follow-up
ultrasound in 8-12 weeks is recommended to reassess the right
adnexal region.

The uterus is normal in echotexture and contour. The endometrial
stripe is mildly thickened for age and measures 7.1 mm. Reportedly
the patient is premenopausal. If the patient is actually
postmenopausal, endometrial sampling is indicated to exclude
carcinoma. If results are benign, sonohysterogram should be
considered for focal lesion work-up. (Ref: Radiological Reasoning:
Algorithmic Workup of Abnormal Vaginal Bleeding with Endovaginal
Sonography and Sonohysterography. AJR [id number]; [id number])

## 2018-07-22 DIAGNOSIS — J9610 Chronic respiratory failure, unspecified whether with hypoxia or hypercapnia: Secondary | ICD-10-CM | POA: Diagnosis not present

## 2018-07-23 ENCOUNTER — Other Ambulatory Visit: Payer: Self-pay | Admitting: Family

## 2018-07-31 ENCOUNTER — Encounter: Payer: Self-pay | Admitting: Nurse Practitioner

## 2018-07-31 ENCOUNTER — Ambulatory Visit: Payer: Medicare HMO | Admitting: Nurse Practitioner

## 2018-07-31 ENCOUNTER — Encounter
Admission: RE | Admit: 2018-07-31 | Discharge: 2018-07-31 | Disposition: A | Discharge status: Home / Self Care | Payer: Medicare HMO | Source: Ambulatory Visit | Attending: Family | Admitting: Family

## 2018-07-31 ENCOUNTER — Other Ambulatory Visit: Payer: Self-pay

## 2018-07-31 VITALS — BP 94/62 | HR 71 | Temp 98.1°F | Resp 16 | Ht 63.0 in | Wt 242.0 lb

## 2018-07-31 DIAGNOSIS — G894 Chronic pain syndrome: Secondary | ICD-10-CM | POA: Diagnosis not present

## 2018-07-31 DIAGNOSIS — M47816 Spondylosis without myelopathy or radiculopathy, lumbar region: Secondary | ICD-10-CM

## 2018-07-31 DIAGNOSIS — M5116 Intervertebral disc disorders with radiculopathy, lumbar region: Secondary | ICD-10-CM | POA: Insufficient documentation

## 2018-07-31 DIAGNOSIS — F17210 Nicotine dependence, cigarettes, uncomplicated: Secondary | ICD-10-CM | POA: Insufficient documentation

## 2018-07-31 DIAGNOSIS — M4726 Other spondylosis with radiculopathy, lumbar region: Secondary | ICD-10-CM | POA: Diagnosis not present

## 2018-07-31 DIAGNOSIS — R4182 Altered mental status, unspecified: Secondary | ICD-10-CM | POA: Diagnosis not present

## 2018-07-31 DIAGNOSIS — Z9981 Dependence on supplemental oxygen: Secondary | ICD-10-CM | POA: Diagnosis not present

## 2018-07-31 DIAGNOSIS — M545 Low back pain: Secondary | ICD-10-CM | POA: Diagnosis present

## 2018-07-31 DIAGNOSIS — Z6841 Body Mass Index (BMI) 40.0 and over, adult: Secondary | ICD-10-CM | POA: Insufficient documentation

## 2018-07-31 DIAGNOSIS — M160 Bilateral primary osteoarthritis of hip: Secondary | ICD-10-CM | POA: Insufficient documentation

## 2018-07-31 DIAGNOSIS — M48061 Spinal stenosis, lumbar region without neurogenic claudication: Secondary | ICD-10-CM | POA: Insufficient documentation

## 2018-07-31 DIAGNOSIS — J449 Chronic obstructive pulmonary disease, unspecified: Secondary | ICD-10-CM | POA: Diagnosis not present

## 2018-07-31 DIAGNOSIS — M533 Sacrococcygeal disorders, not elsewhere classified: Secondary | ICD-10-CM | POA: Diagnosis not present

## 2018-07-31 DIAGNOSIS — Z79891 Long term (current) use of opiate analgesic: Secondary | ICD-10-CM | POA: Insufficient documentation

## 2018-07-31 DIAGNOSIS — I5032 Chronic diastolic (congestive) heart failure: Secondary | ICD-10-CM

## 2018-07-31 DIAGNOSIS — F329 Major depressive disorder, single episode, unspecified: Secondary | ICD-10-CM | POA: Diagnosis not present

## 2018-07-31 DIAGNOSIS — Z79890 Hormone replacement therapy (postmenopausal): Secondary | ICD-10-CM | POA: Diagnosis not present

## 2018-07-31 DIAGNOSIS — G5603 Carpal tunnel syndrome, bilateral upper limbs: Secondary | ICD-10-CM | POA: Insufficient documentation

## 2018-07-31 DIAGNOSIS — Z7982 Long term (current) use of aspirin: Secondary | ICD-10-CM | POA: Diagnosis not present

## 2018-07-31 DIAGNOSIS — Z79899 Other long term (current) drug therapy: Secondary | ICD-10-CM | POA: Insufficient documentation

## 2018-07-31 DIAGNOSIS — M5010 Cervical disc disorder with radiculopathy, unspecified cervical region: Secondary | ICD-10-CM | POA: Insufficient documentation

## 2018-07-31 DIAGNOSIS — E559 Vitamin D deficiency, unspecified: Secondary | ICD-10-CM | POA: Insufficient documentation

## 2018-07-31 DIAGNOSIS — G4730 Sleep apnea, unspecified: Secondary | ICD-10-CM | POA: Diagnosis not present

## 2018-07-31 DIAGNOSIS — I110 Hypertensive heart disease with heart failure: Secondary | ICD-10-CM | POA: Insufficient documentation

## 2018-07-31 DIAGNOSIS — J9610 Chronic respiratory failure, unspecified whether with hypoxia or hypercapnia: Secondary | ICD-10-CM | POA: Diagnosis not present

## 2018-07-31 LAB — BASIC METABOLIC PANEL
Anion gap: 12 (ref 5–15)
BUN: 19 mg/dL (ref 8–23)
CO2: 31 mmol/L (ref 22–32)
Calcium: 9.4 mg/dL (ref 8.9–10.3)
Chloride: 99 mmol/L (ref 98–111)
Creatinine, Ser: 0.83 mg/dL (ref 0.44–1.00)
GFR calc Af Amer: >60 mL/min (ref >60)
GFR calc non Af Amer: >60 mL/min (ref >60)
Glucose, Bld: 115 mg/dL — ABNORMAL HIGH (ref 70–99)
Potassium: 3.8 mmol/L (ref 3.5–5.1)
Sodium: 142 mmol/L (ref 135–145)

## 2018-07-31 MED ORDER — GABAPENTIN 300 MG PO CAPS: 300.0000 mg | ORAL_CAPSULE | Freq: Four times a day (QID) | ORAL | 2 refills | Status: DC

## 2018-07-31 MED ORDER — HYDROCODONE-ACETAMINOPHEN 5-325 MG PO TABS: 1.0000 | ORAL_TABLET | Freq: Three times a day (TID) | ORAL | 0 refills | Status: DC | PRN

## 2018-07-31 NOTE — Progress Notes (Signed)
Patient's Name: Theresa [name]  MRN: [id number]  Referring Provider: Margo Common, [place]  DOB: [birth year]  PCP: Margo Common, PA  DOS: 07/31/2018  Note by: Vevelyn Francois NP  Service setting: Ambulatory outpatient  Specialty: Interventional Pain Management  Location: [facility] (AMB) Pain Management Facility    Patient type: Established    Primary Reason(s) for Visit: Encounter for prescription drug management. (Level of risk: moderate)  CC: Back Pain (lower)  HPI  Theresa [name] is a 62 y.o. year old, female patient, who comes today for a medication management evaluation. She has Neuritis or radiculitis due to rupture of lumbar intervertebral disc; Decreased motor strength; Essential (primary) hypertension; Clinical depression; DDD (degenerative disc disease), lumbar; CAFL (chronic airflow limitation) ([place]); Chronic rhinitis; Carpal tunnel syndrome; Blood clotting disorder ([place]); Anxiety state; Chronic diastolic heart failure ([place]); Chest pain; Tobacco abuse; Encounter for therapeutic drug level monitoring; Encounter for chronic pain management; Pain management; Neurogenic pain; Neuropathic pain; Musculoskeletal pain; Lumbar spondylosis (L4-5); Chronic low back pain (Primary Area of Pain) (Left); Chronic lower extremity pain (Secondary Area of Pain) (Left); Chronic lumbar radicular pain (L4 & S1 Dermatome) (Left); Chronic shoulder pain (Right side); Chronic upper extremity pain (Right-sided); Cervical radiculitis (Right side); Abnormal x-ray of lumbar spine; Chronic pain syndrome (significant psychosocial component); 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; Coagulation disorder ([place]); 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); Obesity, Class III, BMI 40-49.9 (morbid obesity) ([place]); 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[place]); Vitamin D deficiency; Long term current use of opiate analgesic; Long term prescription opiate use; Opiate use (15 MME/Day); Lumbar facet syndrome  (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); Chronic sacroiliac joint pain (Bilateral); Sleep apnea; Supplemental oxygen dependent; Lumbar facet hypertrophy (multilevel); Lumbar spinal stenosis (L4-5); 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); 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); Lumbosacral radiculopathy at L4; Grief at loss of child; Chronic shoulder pain (Left); Elevated brain natriuretic peptide (BNP) level; Arthralgia of acromioclavicular joint (Right); Acromioclavicular joint DJD (Right); Osteoarthritis of shoulder (Right); COPD with hypoxia ([place]); Other long term (current) drug therapy; Disorder of bone, unspecified; COPD (chronic obstructive pulmonary disease) ([place]); Chronic hip pain (Tertiary Area of Pain) (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); Altered mental status, unspecified; Chronic lower extremity pain (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); and Ventral hernia without obstruction or gangrene on their problem list. Her primarily concern today is the Back Pain (lower)  Pain Assessment: Location: Lower Back Radiating:  down outer thigh Onset: More than a month ago Duration: Chronic pain Quality: Aching, Discomfort Severity: 8 /10 (subjective, self-reported pain score)  Note: Reported level is compatible with observation. Clinically the patient looks like a 1/10 A 1/10 is viewed as "Mild" and described as nagging, annoying, but not interfering with basic activities of daily living (ADL). Theresa [name] is able to eat, bathe, get dressed, do toileting (being able to get on and off the toilet and perform personal hygiene functions), transfer (move in and out of bed or a chair without assistance), and maintain continence (able to control bladder and bowel functions). Physiologic parameters such as blood pressure and heart rate apear wnl.       When using our objective Pain Scale, levels between 6 and 10/10 are said to belong in an emergency room, as it progressively worsens from a 6/10, described as severely limiting, requiring emergency care  not usually available at an outpatient pain management facility. At a 6/10 level, communication becomes difficult and requires great effort. Assistance to reach the emergency department may be required. Facial flushing and profuse sweating along with potentially dangerous  increases in heart rate and blood pressure will be evident. Effect on ADL: ADL;s like washing, cooking, standing Timing: Constant Modifying factors: rest, medication BP: 94/62  HR: 71  Theresa [name] was last scheduled for an appointment on 05/02/2018 for medication management. During today's appointment we reviewed Ms. [name]'s chronic pain status, as well as her outpatient medication regimen.  She admits that she has an umbilical hernia is going to have this repaired.  She states that her back pain is getting worse especially on the left side going into her buttocks down her thigh.  She denies any falls or injuries.  Her last MRI was in 2017.  She admits that she has failed several interventional therapies in the past.  She admits that the hydrocodone  ibuprofen.  The patient  reports that she does not use drugs. Her body mass index is 42.87 kg/m.  Further details on both, my assessment(s), as well as the proposed treatment plan, please see below.  Controlled Substance Pharmacotherapy Assessment REMS (Risk Evaluation and Mitigation Strategy)  Analgesic:Hydrocodone/APAP 5/325 one tablet every 8 hours (15 mg/day of hydrocodone) MME/day:15 mg/day Ignatius Specking, RN  07/31/2018 12:05 PM  Sign at close encounter Nursing Pain Medication Assessment:  Safety precautions to be maintained throughout the outpatient stay will include: orient to surroundings, keep bed in low position, maintain call bell within reach at all times, provide assistance with transfer out of bed and ambulation.  Medication Inspection Compliance: Pill count conducted under aseptic conditions, in front of the patient. Neither the pills nor the bottle was  removed from the patient's sight at any time. Once count was completed pills were immediately returned to the patient in their original bottle.  Medication: Hydrocodone/APAP Pill/Patch Count: 57 of 90 pills remain Pill/Patch Appearance: Markings consistent with prescribed medication Bottle Appearance: Standard pharmacy container. Clearly labeled. Filled Date: 10 / 4 / 2019 Last Medication intake:  Today   Pharmacokinetics: Liberation and absorption (onset of action): WNL Distribution (time to peak effect): WNL Metabolism and excretion (duration of action): WNL         Pharmacodynamics: Desired effects: Analgesia: Ms. Bai reports >50% benefit. Functional ability: Patient reports that medication allows her to accomplish basic ADLs Clinically meaningful improvement in function (CMIF): Sustained CMIF goals met Perceived effectiveness: Described as relatively effective, allowing for increase in activities of daily living (ADL) Undesirable effects: Side-effects or Adverse reactions: None reported Monitoring: [place] PMP: Online review of the past 22-monthperiod conducted. Compliant with practice rules and regulations Last UDS on record: Summary  Date Value Ref Range Status  11/08/2017 FINAL  Final    Comment:    ==================================================================== TOXASSURE SELECT 13 (MW) ==================================================================== Test                             Result       Flag       Units Drug Present and Declared for Prescription Verification   Hydrocodone                    679          EXPECTED   ng/mg creat   Norhydrocodone                 831          EXPECTED   ng/mg creat    Sources of hydrocodone include scheduled prescription    medications. Norhydrocodone is an expected metabolite of  hydrocodone. Drug Absent but Declared for Prescription Verification   Alprazolam                     Not Detected UNEXPECTED ng/mg  creat ==================================================================== Test                      Result    Flag   Units      Ref Range   Creatinine              29               mg/dL      >=20 ==================================================================== Declared Medications:  The flagging and interpretation on this report are based on the  following declared medications.  Unexpected results may arise from  inaccuracies in the declared medications.  **Note: The testing scope of this panel includes these medications:  Alprazolam  Hydrocodone (Norco)  **Note: The testing scope of this panel does not include following  reported medications:  Acetaminophen (Norco)  Albuterol (Duoneb)  Albuterol (Proventil)  Bisoprolol (Ziac)  Cholecalciferol  Fluticasone (Advair)  Furosemide (Lasix)  Gabapentin (Neurontin)  Hydrochlorothiazide (Ziac)  Ipratropium (Duoneb)  Ketoconazole (Nizoral)  Potassium (Klor-Con)  Ranitidine (Zantac)  Salicylate (Anacin)  Salmeterol (Advair)  Sertraline (Zoloft) ==================================================================== For clinical consultation, please call [phone]. ====================================================================    UDS interpretation: Compliant          Medication Assessment Form: Reviewed. Patient indicates being compliant with therapy Treatment compliance: Compliant Risk Assessment Profile: Aberrant behavior: See prior evaluations. None observed or detected today Comorbid factors increasing risk of overdose: See prior notes. No additional risks detected today Opioid risk tool (ORT) (Total Score):   Personal History of Substance Abuse (SUD-Substance use disorder):  Alcohol:    Illegal Drugs:    Rx Drugs:    ORT Risk Level calculation:   Risk of substance use disorder (SUD): Low  ORT Scoring interpretation table:  Score <3 = Low Risk for SUD  Score between 4-7 = Moderate Risk for SUD  Score >8 =  High Risk for Opioid Abuse   Risk Mitigation Strategies:  Patient Counseling: Covered Patient-Prescriber Agreement (PPA): Present and active  Notification to other healthcare providers: Done  Pharmacologic Plan: No change in therapy, at this time.             Laboratory Chemistry  Inflammation Markers (CRP: Acute Phase) (ESR: Chronic Phase) Lab Results  Component Value Date   CRP 9.1 (H) 08/08/2017   ESRSEDRATE 16 08/08/2017   LATICACIDVEN 0.9 12/10/2017                         Rheumatology Markers No results found for: RF, ANA, LABURIC, URICUR, LYMEIGGIGMAB, LYMEABIGMQN, HLAB27                      Renal Function Markers Lab Results  Component Value Date   BUN 19 07/31/2018   CREATININE 0.83 07/31/2018   BCR 17 03/01/2018   GFRAA >60 07/31/2018   GFRNONAA >60 07/31/2018                             Hepatic Function Markers Lab Results  Component Value Date   AST 18 03/01/2018   ALT 20 03/01/2018   ALBUMIN 4.0 03/01/2018   ALKPHOS 107 03/01/2018   LIPASE 31 10/17/2017   AMMONIA 31 12/09/2017  Electrolytes Lab Results  Component Value Date   NA 142 07/31/2018   K 3.8 07/31/2018   CL 99 07/31/2018   CALCIUM 9.4 07/31/2018   MG 2.5 (H) 12/10/2017   PHOS 5.4 (H) 12/10/2017                        Neuropathy Markers Lab Results  Component Value Date   VITAMINB12 501 08/08/2017   HIV Non Reactive 12/10/2017                        CNS Tests No results found for: COLORCSF, APPEARCSF, RBCCOUNTCSF, WBCCSF, POLYSCSF, LYMPHSCSF, EOSCSF, PROTEINCSF, GLUCCSF, JCVIRUS, CSFOLI, IGGCSF                      Bone Pathology Markers Lab Results  Component Value Date   25OHVITD1 13 (L) 09/17/2015   25OHVITD2 <1.0 09/17/2015   25OHVITD3 13 09/17/2015                         Coagulation Parameters Lab Results  Component Value Date   INR 1.04 12/09/2017   LABPROT 13.6 12/09/2017   APTT 36 12/09/2017   PLT 178 03/01/2018                         Cardiovascular Markers Lab Results  Component Value Date   BNP 333.4 (H) 12/10/2017   CKTOTAL 72 12/27/2012   CKMB 1.0 12/27/2012   TROPONINI <0.03 02/07/2018   HGB 13.7 03/01/2018   HCT 41.9 03/01/2018                         CA Markers No results found for: CEA, CA125, LABCA2                      Note: Lab results reviewed.  Recent Diagnostic Imaging Results  MM 3D SCREEN BREAST BILATERAL CLINICAL DATA:  Screening.  EXAM: DIGITAL SCREENING BILATERAL MAMMOGRAM WITH TOMO AND CAD  COMPARISON:  Previous exam(s).  ACR Breast Density Category a: The breast tissue is almost entirely fatty.  FINDINGS: There are no findings suspicious for malignancy. Images were processed with CAD.  IMPRESSION: No mammographic evidence of malignancy. A result letter of this screening mammogram will be mailed directly to the patient.  RECOMMENDATION: Screening mammogram in one year. (Code:[id number]-B-01Y)  BI-RADS CATEGORY  1: Negative.  Electronically Signed   By: Kristopher Oppenheim M.D.   On: 06/22/2018 12:41  Complexity Note: Imaging results reviewed. Results shared with Ms. Martinson, using Layman's terms.                         Meds   Current Outpatient Medications:  .  ADVAIR DISKUS 250-50 MCG/DOSE AEPB, Inhale 1 puff into the lungs 2 (two) times daily., Disp: , Rfl: 3 .  ALPRAZolam (XANAX) 0.5 MG tablet, Take 1 tablet (0.5 mg total) by mouth 2 (two) times daily as needed for anxiety., Disp: 60 tablet, Rfl: 0 .  aspirin 81 MG tablet, Take 81 mg by mouth daily., Disp: , Rfl:  .  bisoprolol-hydrochlorothiazide (ZIAC) 10-6.25 MG tablet, Take 1 tablet by mouth daily., Disp: 90 tablet, Rfl: 0 .  Cholecalciferol ([employer] D3) 2000 units CAPS, Take 1 capsule by mouth daily., Disp: , Rfl:  .  DALIRESP 500 MCG TABS  tablet, Take 500 mcg by mouth daily., Disp: , Rfl:  .  gabapentin (NEURONTIN) 300 MG capsule, Take 1 capsule (300 mg total) by mouth 4 (four) times daily., Disp: 120 capsule, Rfl: 2 .   [START ON 10/18/2018] HYDROcodone-acetaminophen (NORCO/VICODIN) 5-325 MG tablet, Take 1 tablet by mouth every 8 (eight) hours as needed for severe pain., Disp: 90 tablet, Rfl: 0 .  ipratropium-albuterol (DUONEB) 0.5-2.5 (3) MG/3ML SOLN, Inhale 3 mLs into the lungs every 6 (six) hours as needed for shortness of breath., Disp: , Rfl: 3 .  levothyroxine (SYNTHROID, LEVOTHROID) 25 MCG tablet, TAKE 1 TABLET(25 MCG) BY MOUTH DAILY BEFORE BREAKFAST, Disp: 90 tablet, Rfl: 3 .  OXYGEN, Inhale 2.5 L into the lungs continuous. Reported on 11/03/2015, Disp: , Rfl:  .  potassium chloride SA (K-DUR,KLOR-CON) 20 MEQ tablet, Take 1 tablet (20 mEq total) by mouth 2 (two) times daily., Disp: 180 tablet, Rfl: 3 .  ranitidine (ZANTAC) 150 MG tablet, Take 150 mg by mouth 2 (two) times daily., Disp: , Rfl:  .  sertraline (ZOLOFT) 100 MG tablet, Take 2 tablets (200 mg total) by mouth every morning., Disp: 180 tablet, Rfl: 2 .  torsemide (DEMADEX) 20 MG tablet, Take 2 tablets (40 mg total) by mouth 2 (two) times daily., Disp: 120 tablet, Rfl: 3 .  VENTOLIN HFA 108 (90 Base) MCG/ACT inhaler, Inhale 2 puffs into the lungs every 6 (six) hours as needed for shortness of breath., Disp: 1 Inhaler, Rfl: 5 .  [START ON 09/18/2018] HYDROcodone-acetaminophen (NORCO/VICODIN) 5-325 MG tablet, Take 1 tablet by mouth every 8 (eight) hours as needed for severe pain., Disp: 90 tablet, Rfl: 0 .  [START ON 08/19/2018] HYDROcodone-acetaminophen (NORCO/VICODIN) 5-325 MG tablet, Take 1 tablet by mouth every 8 (eight) hours as needed for severe pain., Disp: 90 tablet, Rfl: 0 .  levothyroxine (SYNTHROID, LEVOTHROID) 25 MCG tablet, Take 1 tablet (25 mcg total) by mouth daily before breakfast., Disp: 90 tablet, Rfl: 2  ROS  Constitutional: Denies any fever or chills Gastrointestinal: No reported hemesis, hematochezia, vomiting, or acute GI distress Musculoskeletal: Denies any acute onset joint swelling, redness, loss of ROM, or weakness Neurological:  No reported episodes of acute onset apraxia, aphasia, dysarthria, agnosia, amnesia, paralysis, loss of coordination, or loss of consciousness  Allergies  Ms. Kromer is allergic to codeine and meloxicam.  PFSH  Drug: Ms. Finerty  reports that she does not use drugs. Alcohol:  reports that she does not drink alcohol. Tobacco:  reports that she has been smoking cigarettes. She has a 44.00 pack-year smoking history. She has never used smokeless tobacco. Medical:  has a past medical history of Acute postoperative pain (10/03/2017), Anxiety, Carpal tunnel syndrome, CHF (congestive heart failure) ([place]), CHF (congestive heart failure) ([place]), Chronic generalized abdominal pain, Chronic rhinitis, COPD (chronic obstructive pulmonary disease) ([place]), DDD (degenerative disc disease), cervical, Depression, Edema, Flu, Gastritis, Hematuria, Hernia, abdominal, Hypertension, Kidney stones, Low back pain, Lumbar radiculitis, Malodorous urine, Muscle weakness, Obesity, Oxygen dependent, Renal cyst, Sensory urge incontinence, Thyroid activity decreased, and Tobacco abuse. Surgical: Ms. Cirilo  has a past surgical history that includes Abdominal hysterectomy; Tonsillectomy; Carpal tunnel release (Left); and Tubal ligation. Family: family history includes Alcohol abuse in her father; Breast cancer in her sister; Cancer in her brother, brother, and sister; Coronary artery disease in her mother; Heart disease in her father and mother; Lung cancer in her sister; Pneumonia in her brother; Stroke in her mother.  Constitutional Exam  General appearance: alert, cooperative, in no distress  and morbidly obese Vitals:   07/31/18 1153  BP: 94/62  Pulse: 71  Resp: 16  Temp: 98.1 F (36.7 C)  SpO2: 93%  Weight: 242 lb (109.8 kg)  Height: _0  (1.6 m)  Psych/Mental status: Alert, oriented x 3 (person, place, & time)       Eyes: PERLA Respiratory: Oxygen-dependent COPD Abdomen: morbidly obese.   Lumbar Spine Area Exam   Skin & Axial Inspection: No masses, redness, or swelling Alignment: Symmetrical Functional ROM: Unrestricted ROM       Stability: No instability detected Muscle Tone/Strength: Functionally intact. No obvious neuro-muscular anomalies detected. Sensory (Neurological): Unimpaired Palpation: Tender       Provocative Tests: Hyperextension/rotation test: (+) bilaterally for facet joint pain.  Gait & Posture Assessment  Ambulation: Patient ambulates using a wheel chair Gait: Relatively normal for age and body habitus Posture: WNL   Lower Extremity Exam    Side: Right lower extremity  Side: Left lower extremity  Stability: No instability observed          Stability: No instability observed          Skin & Extremity Inspection: Skin color, temperature, and hair growth are WNL. No peripheral edema or cyanosis. No masses, redness, swelling, asymmetry, or associated skin lesions. No contractures.  Skin & Extremity Inspection: Skin color, temperature, and hair growth are WNL. No peripheral edema or cyanosis. No masses, redness, swelling, asymmetry, or associated skin lesions. No contractures.  Functional ROM: Unrestricted ROM                  Functional ROM: Unrestricted ROM                  Muscle Tone/Strength: Functionally intact. No obvious neuro-muscular anomalies detected.  Muscle Tone/Strength: Functionally intact. No obvious neuro-muscular anomalies detected.  Sensory (Neurological): Unimpaired  Sensory (Neurological): Unimpaired  Palpation: No palpable anomalies  Palpation: No palpable anomalies   Assessment  Primary Diagnosis & Pertinent Problem List: The primary encounter diagnosis was Lumbar spondylosis (L4-5). 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  Status Diagnosis  Worsening Worsening Worsening 1. Lumbar spondylosis (L4-5)   2.  Lumbar facet hypertrophy (multilevel)   3. 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 Chronic pain syndrome   5. Long term prescription opiate use     Problems updated and reviewed during this visit: No problems updated. Plan of Care  Pharmacotherapy (Medications Ordered): Meds ordered this encounter  Medications  . HYDROcodone-acetaminophen (NORCO/VICODIN) 5-325 MG tablet    Sig: Take 1 tablet by mouth every 8 (eight) hours as needed for severe pain.    Dispense:  90 tablet    Refill:  0    Do not add this medication to the electronic "Automatic Refill" notification system. Patient may have prescription filled one day early if pharmacy is closed on scheduled refill date.    Order Specific Question:   Supervising Provider    Answer:   Milinda Pointer [phone]  . HYDROcodone-acetaminophen (NORCO/VICODIN) 5-325 MG tablet    Sig: Take 1 tablet by mouth every 8 (eight) hours as needed for severe pain.    Dispense:  90 tablet    Refill:  0    Do not add this medication to the electronic "Automatic Refill" notification system. Patient may have prescription filled one day early if pharmacy is closed on scheduled refill date.  Order Specific Question:   Supervising Provider    Answer:   Milinda Pointer [phone]  . HYDROcodone-acetaminophen (NORCO/VICODIN) 5-325 MG tablet    Sig: Take 1 tablet by mouth every 8 (eight) hours as needed for severe pain.    Dispense:  90 tablet    Refill:  0    Do not add this medication to the electronic "Automatic Refill" notification system. Patient may have prescription filled one day early if pharmacy is closed on scheduled refill date.    Order Specific Question:   Supervising Provider    Answer:   Milinda Pointer [phone]  . gabapentin (NEURONTIN) 300 MG capsule    Sig: Take 1 capsule (300 mg total) by mouth 4 (four) times daily.    Dispense:  120 capsule    Refill:  2    Do not place medication on "Automatic Refill". Fill one day early if  pharmacy is closed on scheduled refill date.    Order Specific Question:   Supervising Provider    Answer:   Milinda Pointer [[id number]]   New Prescriptions   No medications on file   Medications administered today: Lakiyah Arntson had no medications administered during this visit. Lab-work, procedure(s), and/or referral(s): Orders Placed This Encounter  Procedures  . MR LUMBAR SPINE WO CONTRAST  . ToxASSURE Select 13 (MW), Urine   Imaging and/or referral(s): MR LUMBAR SPINE WO CONTRAST  Interventional therapies: Planned, scheduled, and/or pending: Not at this time.MRI to evaluate changes.  Discussed drug holiday.   Cfor a onsidering:  Diagnostic bilateral lumbar facet block Possible bilateral lumbar facet RFA   Palliative PRN treatment(s):  Palliative left sided lumbar facetblock     Provider-requested follow-up: Return in about 3 months (around 10/31/2018) for MedMgmt, MRI, F/U eval follow MRI poss change need in treatment, w/ Dr. Dossie Arbour.  Future Appointments  Date Time Provider [place]  08/02/2018 10:00 AM Byrnett, Forest Gleason, MD [facility] None  10/01/2018  1:20 PM [name], FNP [facility] None  10/29/2018 10:30 AM Vevelyn Francois, NP [facility] None  01/21/2019  2:20 PM [employer] HEALTH ADVISOR [facility] None  04/15/2019 10:00 AM Harlin Heys, MD [facility] None   Primary Care Physician: Margo Common, PA Location: [facility] Note by: Vevelyn Francois NP Date: 07/31/2018; Time: 1:58 PM  Pain Score Disclaimer: We use the NRS-11 scale. This is a self-reported, subjective measurement of pain severity with only modest accuracy. It is used primarily to identify changes within a particular patient. It must be understood that outpatient pain scales are significantly less accurate that those used for research, where they can be applied under ideal controlled circumstances with minimal exposure to variables. In reality, the  score is likely to be a combination of pain intensity and pain affect, where pain affect describes the degree of emotional arousal or changes in action readiness caused by the sensory experience of pain. Factors such as social and work situation, setting, emotional state, anxiety levels, expectation, and prior pain experience may influence pain perception and show large inter-individual differences that may also be affected by time variables.  Patient instructions provided during this appointment: Patient Instructions   ____________________________________________________________________________________________  Medication Rules  Applies to: All patients receiving prescriptions (written or electronic).  Pharmacy of record: Pharmacy where electronic prescriptions will be sent. If written prescriptions are taken to a different pharmacy, please inform the nursing staff. The pharmacy listed in the electronic medical record should be the one where you would  like electronic prescriptions to be sent.  Prescription refills: Only during scheduled appointments. Applies to both, written and electronic prescriptions.  NOTE: The following applies primarily to controlled substances (Opioid* Pain Medications).   Patient's responsibilities: 1. Pain Pills: Bring all pain pills to every appointment (except for procedure appointments). 2. Pill Bottles: Bring pills in original pharmacy bottle. Always bring newest bottle. Bring bottle, even if empty. 3. Medication refills: You are responsible for knowing and keeping track of what medications you need refilled. The day before your appointment, write a list of all prescriptions that need to be refilled. Bring that list to your appointment and give it to the admitting nurse. Prescriptions will be written only during appointments. If you forget a medication, it will not be "Called in", "Faxed", or "electronically sent". You will need to get another appointment to get  these prescribed. 4. Prescription Accuracy: You are responsible for carefully inspecting your prescriptions before leaving our office. Have the discharge nurse carefully go over each prescription with you, before taking them home. Make sure that your name is accurately spelled, that your address is correct. Check the name and dose of your medication to make sure it is accurate. Check the number of pills, and the written instructions to make sure they are clear and accurate. Make sure that you are given enough medication to last until your next medication refill appointment. 5. Taking Medication: Take medication as prescribed. Never take more pills than instructed. Never take medication more frequently than prescribed. Taking less pills or less frequently is permitted and encouraged, when it comes to controlled substances (written prescriptions).  6. Inform other Doctors: Always inform, all of your healthcare providers, of all the medications you take. 7. Pain Medication from other Providers: You are not allowed to accept any additional pain medication from any other Doctor or Healthcare provider. There are two exceptions to this rule. (see below) In the event that you require additional pain medication, you are responsible for notifying us, as stated below. 8. Medication Agreement: You are responsible for carefully reading and following our Medication Agreement. This must be signed before receiving any prescriptions from our practice. Safely store a copy of your signed Agreement. Violations to the Agreement will result in no further prescriptions. (Additional copies of our Medication Agreement are available upon request.) 9. Laws, Rules, & Regulations: All patients are expected to follow all Federal and [employer], [employer], Rules, [employer]. Ignorance of the Laws does not constitute a valid excuse. The use of any illegal substances is prohibited. 10. Adopted CDC guidelines & recommendations: Target  dosing levels will be at or below 60 MME/day. Use of benzodiazepines** is not recommended.  Exceptions: There are only two exceptions to the rule of not receiving pain medications from other Healthcare Providers. 1. Exception #1 (Emergencies): In the event of an emergency (i.e.: accident requiring emergency care), you are allowed to receive additional pain medication. However, you are responsible for: As soon as you are able, call our office [phone], at any time of the day or night, and leave a message stating your name, the date and nature of the emergency, and the name and dose of the medication prescribed. In the event that your call is answered by a member of our staff, make sure to document and save the date, time, and the name of the person that took your information.  2. Exception #2 (Planned Surgery): In the event that you are scheduled by another doctor or dentist to have  any type of surgery or procedure, you are allowed (for a period no longer than 30 days), to receive additional pain medication, for the acute post-op pain. However, in this case, you are responsible for picking up a copy of our "Post-op Pain Management for Surgeons" handout, and giving it to your surgeon or dentist. This document is available at our office, and does not require an appointment to obtain it. Simply go to our office during business hours (Monday-Thursday from 8:00 AM to 4:00 PM) (Friday 8:00 AM to 12:00 Noon) or if you have a scheduled appointment with [place], prior to your surgery, and ask for it by name. In addition, you will need to provide [place] with your name, name of your surgeon, type of surgery, and date of procedure or surgery.  *Opioid medications include: morphine, codeine, oxycodone, oxymorphone, hydrocodone, hydromorphone, meperidine, tramadol, tapentadol, buprenorphine, fentanyl, methadone. **Benzodiazepine medications include: diazepam (Valium), alprazolam (Xanax), clonazepam (Klonopine), lorazepam  (Ativan), clorazepate (Tranxene), chlordiazepoxide (Librium), estazolam (Prosom), oxazepam (Serax), temazepam (Restoril), triazolam (Halcion) (Last updated: 12/14/2017) ____________________________________________________________________________________________   BMI Assessment: Estimated body mass index is 42.87 kg/m as calculated from the following:   Height as of this encounter: _0  (1.6 m).   Weight as of this encounter: 242 lb (109.8 kg).  BMI interpretation table: BMI level Category Range association with higher incidence of chronic pain  <18 kg/m2 Underweight   18.5-24.9 kg/m2 Ideal body weight   25-29.9 kg/m2 Overweight Increased incidence by 20%  30-34.9 kg/m2 Obese (Class I) Increased incidence by 68%  35-39.9 kg/m2 Severe obesity (Class II) Increased incidence by 136%  >40 kg/m2 Extreme obesity (Class III) Increased incidence by 254%   Patient's current BMI Ideal Body weight  Body mass index is 42.87 kg/m. Ideal body weight: 52.4 kg (115 lb 8.3 oz) Adjusted ideal body weight: 75.3 kg (166 lb 1.8 oz)   BMI Readings from Last 4 Encounters:  07/31/18 42.87 kg/m  07/09/18 43.60 kg/m  05/21/18 45.36 kg/m  05/08/18 44.12 kg/m   Wt Readings from Last 4 Encounters:  07/31/18 242 lb (109.8 kg)  07/09/18 246 lb 2 oz (111.6 kg)  05/21/18 248 lb (112.5 kg)  05/08/18 241 lb 3.2 oz (109.4 kg)

## 2018-07-31 NOTE — Patient Instructions (Addendum)
____________________________________________________________________________________________  Medication Rules  Applies to: All patients receiving prescriptions (written or electronic).  Pharmacy of record: Pharmacy where electronic prescriptions will be sent. If written prescriptions are taken to a different pharmacy, please inform the nursing staff. The pharmacy listed in the electronic medical record should be the one where you would like electronic prescriptions to be sent.  Prescription refills: Only during scheduled appointments. Applies to both, written and electronic prescriptions.  NOTE: The following applies primarily to controlled substances (Opioid* Pain Medications).   Patient's responsibilities: 1. Pain Pills: Bring all pain pills to every appointment (except for procedure appointments). 2. Pill Bottles: Bring pills in original pharmacy bottle. Always bring newest bottle. Bring bottle, even if empty. 3. Medication refills: You are responsible for knowing and keeping track of what medications you need refilled. The day before your appointment, write a list of all prescriptions that need to be refilled. Bring that list to your appointment and give it to the admitting nurse. Prescriptions will be written only during appointments. If you forget a medication, it will not be "Called in", "Faxed", or "electronically sent". You will need to get another appointment to get these prescribed. 4. Prescription Accuracy: You are responsible for carefully inspecting your prescriptions before leaving our office. Have the discharge nurse carefully go over each prescription with you, before taking them home. Make sure that your name is accurately spelled, that your address is correct. Check the name and dose of your medication to make sure it is accurate. Check the number of pills, and the written instructions to make sure they are clear and accurate. Make sure that you are given enough medication to last  until your next medication refill appointment. 5. Taking Medication: Take medication as prescribed. Never take more pills than instructed. Never take medication more frequently than prescribed. Taking less pills or less frequently is permitted and encouraged, when it comes to controlled substances (written prescriptions).  6. Inform other Doctors: Always inform, all of your healthcare providers, of all the medications you take. 7. Pain Medication from other Providers: You are not allowed to accept any additional pain medication from any other Doctor or Healthcare provider. There are two exceptions to this rule. (see below) In the event that you require additional pain medication, you are responsible for notifying us, as stated below. 8. Medication Agreement: You are responsible for carefully reading and following our Medication Agreement. This must be signed before receiving any prescriptions from our practice. Safely store a copy of your signed Agreement. Violations to the Agreement will result in no further prescriptions. (Additional copies of our Medication Agreement are available upon request.) 9. Laws, Rules, & Regulations: All patients are expected to follow all Federal and State Laws, Statutes, Rules, & Regulations. Ignorance of the Laws does not constitute a valid excuse. The use of any illegal substances is prohibited. 10. Adopted CDC guidelines & recommendations: Target dosing levels will be at or below 60 MME/day. Use of benzodiazepines** is not recommended.  Exceptions: There are only two exceptions to the rule of not receiving pain medications from other Healthcare Providers. 1. Exception #1 (Emergencies): In the event of an emergency (i.e.: accident requiring emergency care), you are allowed to receive additional pain medication. However, you are responsible for: As soon as you are able, call our office [phone], at any time of the day or night, and leave a message stating your name, the  date and nature of the emergency, and the name and dose of the medication   prescribed. In the event that your call is answered by a member of our staff, make sure to document and save the date, time, and the name of the person that took your information.  2. Exception #2 (Planned Surgery): In the event that you are scheduled by another doctor or dentist to have any type of surgery or procedure, you are allowed (for a period no longer than 30 days), to receive additional pain medication, for the acute post-op pain. However, in this case, you are responsible for picking up a copy of our "Post-op Pain Management for Surgeons" handout, and giving it to your surgeon or dentist. This document is available at our office, and does not require an appointment to obtain it. Simply go to our office during business hours (Monday-Thursday from 8:00 AM to 4:00 PM) (Friday 8:00 AM to 12:00 Noon) or if you have a scheduled appointment with [place], prior to your surgery, and ask for it by name. In addition, you will need to provide [place] with your name, name of your surgeon, type of surgery, and date of procedure or surgery.  *Opioid medications include: morphine, codeine, oxycodone, oxymorphone, hydrocodone, hydromorphone, meperidine, tramadol, tapentadol, buprenorphine, fentanyl, methadone. **Benzodiazepine medications include: diazepam (Valium), alprazolam (Xanax), clonazepam (Klonopine), lorazepam (Ativan), clorazepate (Tranxene), chlordiazepoxide (Librium), estazolam (Prosom), oxazepam (Serax), temazepam (Restoril), triazolam (Halcion) (Last updated: 12/14/2017) ____________________________________________________________________________________________   BMI Assessment: Estimated body mass index is 42.87 kg/m as calculated from the following:   Height as of this encounter: _0  (1.6 m).   Weight as of this encounter: 242 lb (109.8 kg).  BMI interpretation table: BMI level Category Range association with higher incidence  of chronic pain  <18 kg/m2 Underweight   18.5-24.9 kg/m2 Ideal body weight   25-29.9 kg/m2 Overweight Increased incidence by 20%  30-34.9 kg/m2 Obese (Class I) Increased incidence by 68%  35-39.9 kg/m2 Severe obesity (Class II) Increased incidence by 136%  >40 kg/m2 Extreme obesity (Class III) Increased incidence by 254%   Patient's current BMI Ideal Body weight  Body mass index is 42.87 kg/m. Ideal body weight: 52.4 kg (115 lb 8.3 oz) Adjusted ideal body weight: 75.3 kg (166 lb 1.8 oz)   BMI Readings from Last 4 Encounters:  07/31/18 42.87 kg/m  07/09/18 43.60 kg/m  05/21/18 45.36 kg/m  05/08/18 44.12 kg/m   Wt Readings from Last 4 Encounters:  07/31/18 242 lb (109.8 kg)  07/09/18 246 lb 2 oz (111.6 kg)  05/21/18 248 lb (112.5 kg)  05/08/18 241 lb 3.2 oz (109.4 kg)

## 2018-07-31 NOTE — Progress Notes (Signed)
Nursing Pain Medication Assessment:  Safety precautions to be maintained throughout the outpatient stay will include: orient to surroundings, keep bed in low position, maintain call bell within reach at all times, provide assistance with transfer out of bed and ambulation.  Medication Inspection Compliance: Pill count conducted under aseptic conditions, in front of the patient. Neither the pills nor the bottle was removed from the patient's sight at any time. Once count was completed pills were immediately returned to the patient in their original bottle.  Medication: Hydrocodone/APAP Pill/Patch Count: 57 of 90 pills remain Pill/Patch Appearance: Markings consistent with prescribed medication Bottle Appearance: Standard pharmacy container. Clearly labeled. Filled Date: 10 / 4 / 2019 Last Medication intake:  Today

## 2018-08-02 ENCOUNTER — Encounter: Payer: Self-pay | Admitting: General Surgery

## 2018-08-02 ENCOUNTER — Other Ambulatory Visit: Payer: Self-pay

## 2018-08-02 ENCOUNTER — Ambulatory Visit (INDEPENDENT_AMBULATORY_CARE_PROVIDER_SITE_OTHER): Payer: Medicare HMO | Admitting: General Surgery

## 2018-08-02 VITALS — BP 110/74 | HR 98 | Temp 97.7°F | Resp 22 | Ht 63.0 in | Wt 238.6 lb

## 2018-08-02 DIAGNOSIS — K439 Ventral hernia without obstruction or gangrene: Secondary | ICD-10-CM | POA: Diagnosis not present

## 2018-08-02 NOTE — Patient Instructions (Signed)
Please stop at the front desk to schedule your surgery.

## 2018-08-02 NOTE — Progress Notes (Signed)
Patient ID: Theresa [name], female   DOB: 02/04/1956, 62 y.o.   MRN: [id number]  Chief Complaint  Patient presents with  . Follow-up    Umbilical and epigastric hernia-discuss surgery    HPI Theresa [name] is a 62 y.o. female.  Here today to discuss Epigastric and umbilical hernia repair. No changes since last seen July 2019.  HPI  Past Medical History:  Diagnosis Date  . Acute postoperative pain 10/03/2017  . Anxiety   . Carpal tunnel syndrome   . CHF (congestive heart failure) ([place])    1/18  . CHF (congestive heart failure) ([place])   . Chronic generalized abdominal pain   . Chronic rhinitis   . COPD (chronic obstructive pulmonary disease) ([place])   . DDD (degenerative disc disease), cervical   . Depression   . Edema   . Flu    1/18  . Gastritis   . Hematuria   . Hernia, abdominal   . Hypertension   . Kidney stones   . Low back pain   . Lumbar radiculitis   . Malodorous urine   . Muscle weakness   . Obesity   . Oxygen dependent   . Renal cyst   . Sensory urge incontinence   . Thyroid activity decreased    9/19  . Tobacco abuse     Past Surgical History:  Procedure Laterality Date  . ABDOMINAL HYSTERECTOMY    . CARPAL TUNNEL RELEASE Left   . TONSILLECTOMY    . TUBAL LIGATION      Family History  Problem Relation Age of Onset  . Heart disease Mother   . Stroke Mother   . Coronary artery disease Mother   . Lung cancer Sister   . Cancer Sister   . Breast cancer Sister   . Alcohol abuse Father   . Heart disease Father   . Cancer Brother   . Cancer Brother   . Pneumonia Brother   . Prostate cancer Neg Hx   . Kidney cancer Neg Hx   . Bladder Cancer Neg Hx     Social History Social History   Tobacco Use  . Smoking status: Current Every Day Smoker    Packs/day: 1.00    Years: 44.00    Pack years: 44.00    Types: Cigarettes  . Smokeless tobacco: Never Used  Substance Use Topics  . Alcohol use: No    Frequency: Never  . Drug use: No    Allergies   Allergen Reactions  . Codeine Nausea And Vomiting  . Meloxicam Other (See Comments)    Stomach pain    Current Outpatient Medications  Medication Sig Dispense Refill  . ADVAIR DISKUS 250-50 MCG/DOSE AEPB Inhale 1 puff into the lungs 2 (two) times daily.  3  . ALPRAZolam (XANAX) 0.5 MG tablet Take 1 tablet (0.5 mg total) by mouth 2 (two) times daily as needed for anxiety. 60 tablet 0  . aspirin 81 MG tablet Take 81 mg by mouth daily.    . bisoprolol-hydrochlorothiazide (ZIAC) 10-6.25 MG tablet Take 1 tablet by mouth daily. 90 tablet 0  . Cholecalciferol ([employer] D3) 2000 units CAPS Take 2,000 Units by mouth daily.     Marland Kitchen DALIRESP 500 MCG TABS tablet Take 500 mcg by mouth daily.    Marland Kitchen gabapentin (NEURONTIN) 300 MG capsule Take 1 capsule (300 mg total) by mouth 4 (four) times daily. 120 capsule 2  . [START ON 10/18/2018] HYDROcodone-acetaminophen (NORCO/VICODIN) 5-325 MG tablet Take 1 tablet by mouth every  8 (eight) hours as needed for severe pain. (Patient not taking: Reported on 08/02/2018) 90 tablet 0  . [START ON 08/19/2018] HYDROcodone-acetaminophen (NORCO/VICODIN) 5-325 MG tablet Take 1 tablet by mouth every 8 (eight) hours as needed for severe pain. 90 tablet 0  . levothyroxine (SYNTHROID, LEVOTHROID) 25 MCG tablet Take 1 tablet (25 mcg total) by mouth daily before breakfast. 90 tablet 2  . OXYGEN Inhale 2.5 L into the lungs continuous. Reported on 11/03/2015    . potassium chloride SA (K-DUR,KLOR-CON) 20 MEQ tablet Take 1 tablet (20 mEq total) by mouth 2 (two) times daily. 180 tablet 3  . ranitidine (ZANTAC) 150 MG tablet Take 150 mg by mouth 2 (two) times daily.    . sertraline (ZOLOFT) 100 MG tablet Take 2 tablets (200 mg total) by mouth every morning. 180 tablet 2  . torsemide (DEMADEX) 20 MG tablet Take 2 tablets (40 mg total) by mouth 2 (two) times daily. 120 tablet 3  . VENTOLIN HFA 108 (90 Base) MCG/ACT inhaler Inhale 2 puffs into the lungs every 6 (six) hours as needed for shortness of  breath. 1 Inhaler 5  . ibuprofen (ADVIL,MOTRIN) 200 MG tablet Take 1,200 mg by mouth every 6 (six) hours as needed for headache or moderate pain.    Marland Kitchen ipratropium-albuterol (DUONEB) 0.5-2.5 (3) MG/3ML SOLN Take 3 mLs by nebulization every 6 (six) hours as needed (for shortness of breath or wheezing).    Marland Kitchen levothyroxine (SYNTHROID, LEVOTHROID) 25 MCG tablet Take 25 mcg by mouth daily before breakfast.    . Polyvinyl Alcohol-Povidone (REFRESH OP) Place 2 drops into both eyes 2 (two) times daily as needed (for dry eyes).    Marland Kitchen tiotropium (SPIRIVA) 18 MCG inhalation capsule Place 18 mcg into inhaler and inhale daily.     No current facility-administered medications for this visit.     Review of Systems Review of Systems  Constitutional: Negative.   Respiratory: Negative.   Skin: Negative.     Blood pressure 110/74, pulse 98, temperature 97.7 F (36.5 C), temperature source Temporal, resp. rate (!) 22, height _0  (1.6 m), weight 238 lb 9.6 oz (108.2 kg), SpO2 94 %.  Physical Exam Physical Exam  Constitutional: She is oriented to person, place, and time. She appears well-developed and well-nourished.  Eyes: Conjunctivae are normal. No scleral icterus.  Neck: Normal range of motion.  Cardiovascular: Normal rate, regular rhythm and normal heart sounds.  Pulmonary/Chest: Effort normal and breath sounds normal. Right breast exhibits no inverted nipple, no mass, no nipple discharge, no skin change and no tenderness. Left breast exhibits no inverted nipple, no mass, no nipple discharge, no skin change and no tenderness.  Abdominal:    Lymphadenopathy:    She has no cervical adenopathy.  Neurological: She is alert and oriented to person, place, and time.  Skin: Skin is warm and dry.  Psychiatric: Her behavior is normal.    Data Reviewed Medical clearance notes from cardiology and pulmonary physicians at the [facility] clinic received. Recommendations from Dr. Dossie Arbour and pain management  reviewed.  Assessment    Epigastric and umbilical hernia, increasingly symptomatic.  High risk secondary to multiple medical comorbidities and ongoing smoking.    Plan  Indications for surgical intervention have been reviewed.  Potential for mesh repair discussed.  Depending upon the size of each these defects, even if small she may warrant from mesh placement based on her ongoing smoking and severe pulmonary dysfunction.  I do not think should be a good candidate  for laparoscopic repair as the epigastric defect likely is preperitoneal fat and would require extensive dissection..      HPI, Physical Exam, Assessment and Plan have been scribed under the direction and in the presence of Robert Bellow, MD. Theresa [name], CMA  I have completed the exam and reviewed the above documentation for accuracy and completeness.  I agree with the above.  Haematologist has been used and any errors in dictation or transcription are unintentional.  Hervey Ard, M.D., F.A.C.S.  Theresa [name] 08/05/2018, 4:18 PM  Patient's surgery has been scheduled for 08-13-18 at [facility] with Dr. Bary Castilla.  It is okay for patient to continue an 81 mg aspirin once daily.   The patient is aware to call the office should they have further questions.   [place], CMA

## 2018-08-03 DIAGNOSIS — J9610 Chronic respiratory failure, unspecified whether with hypoxia or hypercapnia: Secondary | ICD-10-CM | POA: Diagnosis not present

## 2018-08-05 LAB — TOXASSURE SELECT 13 (MW), URINE

## 2018-08-07 ENCOUNTER — Other Ambulatory Visit: Payer: Self-pay

## 2018-08-07 ENCOUNTER — Encounter
Admission: RE | Admit: 2018-08-07 | Discharge: 2018-08-07 | Disposition: A | Discharge status: Home / Self Care | Payer: Medicare HMO | Source: Ambulatory Visit | Attending: General Surgery | Admitting: General Surgery

## 2018-08-07 DIAGNOSIS — R9431 Abnormal electrocardiogram [ECG] [EKG]: Secondary | ICD-10-CM | POA: Insufficient documentation

## 2018-08-07 DIAGNOSIS — Z01818 Encounter for other preprocedural examination: Secondary | ICD-10-CM | POA: Diagnosis not present

## 2018-08-07 DIAGNOSIS — Z01810 Encounter for preprocedural cardiovascular examination: Secondary | ICD-10-CM | POA: Diagnosis not present

## 2018-08-07 DIAGNOSIS — J449 Chronic obstructive pulmonary disease, unspecified: Secondary | ICD-10-CM | POA: Insufficient documentation

## 2018-08-07 HISTORY — DX: Dyspnea, unspecified: R06.00

## 2018-08-07 HISTORY — DX: Wheezing: R06.2

## 2018-08-07 HISTORY — DX: Gastro-esophageal reflux disease without esophagitis: K21.9

## 2018-08-07 HISTORY — DX: Dependence on other enabling machines and devices: Z99.89

## 2018-08-07 LAB — CBC
HCT: 46.3 % — ABNORMAL HIGH (ref 36.0–46.0)
Hemoglobin: 14.3 g/dL (ref 12.0–15.0)
MCH: 29.7 pg (ref 26.0–34.0)
MCHC: 30.9 g/dL (ref 30.0–36.0)
MCV: 96.1 fL (ref 80.0–100.0)
Platelets: 153 10*3/uL (ref 150–400)
RBC: 4.82 MIL/uL (ref 3.87–5.11)
RDW: 17 % — ABNORMAL HIGH (ref 11.5–15.5)
WBC: 10.5 10*3/uL (ref 4.0–10.5)
nRBC: 0 % (ref 0.0–0.2)

## 2018-08-07 NOTE — Patient Instructions (Addendum)
Your procedure is scheduled on: 08/13/18 Mon Report to Same Day Surgery 2nd floor medical mall [facility] Entrance-take elevator on left to 2nd floor.  Check in with surgery information desk.) To find out your arrival time please call [phone] between 1PM - 3PM on 08/10/18 Fri  Remember: Instructions that are not followed completely may result in serious medical risk, up to and including death, or upon the discretion of your surgeon and anesthesiologist your surgery may need to be rescheduled.    _x___ 1. Do not eat food after midnight the night before your procedure. You may drink clear liquids up to 2 hours before you are scheduled to arrive at the hospital for your procedure.  Do not drink clear liquids within 2 hours of your scheduled arrival to the hospital.  Clear liquids include  --Water or Apple juice without pulp  --Clear carbohydrate beverage such as ClearFast or Gatorade  --Black Coffee or Clear Tea (No milk, no creamers, do not add anything to                  the coffee or Tea Type 1 and type 2 diabetics should only drink water.   ____Ensure clear carbohydrate drink on the way to the hospital for bariatric patients  ____Ensure clear carbohydrate drink 3 hours before surgery for Dr Dwyane Luo patients if physician instructed.   No gum chewing or hard candies.     __x__ 2. No Alcohol for 24 hours before or after surgery.   __x__3. No Smoking or e-cigarettes for 24 prior to surgery.  Do not use any chewable tobacco products for at least 6 hour prior to surgery   ____  4. Bring all medications with you on the day of surgery if instructed.    __x__ 5. Notify your doctor if there is any change in your medical condition     (cold, fever, infections).    x___6. On the morning of surgery brush your teeth with toothpaste and water.  You may rinse your mouth with mouth wash if you wish.  Do not swallow any toothpaste or mouthwash.   Do not wear jewelry, make-up, hairpins,  clips or nail polish.  Do not wear lotions, powders, or perfumes. You may wear deodorant.  Do not shave 48 hours prior to surgery. Men may shave face and neck.  Do not bring valuables to the hospital.    [facility] is not responsible for any belongings or valuables.               Contacts, dentures or bridgework may not be worn into surgery.  Leave your suitcase in the car. After surgery it may be brought to your room.  For patients admitted to the hospital, discharge time is determined by your                       treatment team.  _  Patients discharged the day of surgery will not be allowed to drive home.  You will need someone to drive you home and stay with you the night of your procedure.    Please read over the following fact sheets that you were given:   [facility] Preparing for Surgery and or MRSA Information   _x___ Take anti-hypertensive listed below, cardiac, seizure, asthma,     anti-reflux and psychiatric medicines. These include:  1. ADVAIR DISKUS 250-50 MCG/DOSE AEPB  2.DALIRESP 500 MCG TABS tablet  3.gabapentin (NEURONTIN) 300 MG capsule  4.ipratropium-albuterol (  DUONEB) 0.5-2.5 (3) MG/3ML SOLN  5.levothyroxine (SYNTHROID, LEVOTHROID) 25 MCG tablet  6.ranitidine (ZANTAC) 150 MG tablet  7 sertraline (ZOLOFT) 100 MG tablet  8 tiotropium (SPIRIVA) 18 MCG inhalation capsule  9 VENTOLIN HFA 108 (90 Base) MCG/ACT inhaler bring to hospital  ____Fleets enema or Magnesium Citrate as directed.   _x___ Use CHG Soap or sage wipes as directed on instruction sheet   ____ Use inhalers on the day of surgery and bring to hospital day of surgery  ____ Stop Metformin and Janumet 2 days prior to surgery.    ____ Take 1/2 of usual insulin dose the night before surgery and none on the morning     surgery.   _x___ Follow recommendations from Cardiologist, Pulmonologist or PCP regarding          stopping Aspirin, Coumadin, Plavix ,Eliquis, Effient, or Pradaxa, and Pletal.  X____Stop  Anti-inflammatories such as Advil, Aleve, Ibuprofen, Motrin, Naproxen, Naprosyn, Goodies powders or aspirin products. OK to take Tylenol and                          Celebrex.   _x___ Stop supplements until after surgery.  But may continue Vitamin D, Vitamin B,       and multivitamin.   _c___ Bring BPAP to the hospital.

## 2018-08-07 NOTE — Pre-Procedure Instructions (Addendum)
Pulmonary clearance,Dr Raul Del, and cardiac clearance, Dr Clayborn Bigness, on chart. Patient wheezing with sats 82-85% on arrival to PAT, oxygen at 2L/min:;diaphoretic .  O2 sats 94-95% after sitting for 25 min. Instructed to notify Dr Raul Del if worsened in breathing.

## 2018-08-13 ENCOUNTER — Ambulatory Visit: Payer: Medicare HMO | Admitting: Certified Registered Nurse Anesthetist

## 2018-08-13 ENCOUNTER — Encounter: Payer: Self-pay | Admitting: *Deleted

## 2018-08-13 ENCOUNTER — Encounter
Admission: RE | Disposition: A | Discharge status: Home / Self Care | Payer: Self-pay | Source: Ambulatory Visit | Attending: General Surgery

## 2018-08-13 ENCOUNTER — Ambulatory Visit
Admission: RE | Admit: 2018-08-13 | Discharge: 2018-08-13 | Disposition: A | Discharge status: Home / Self Care | Payer: Medicare HMO | Source: Ambulatory Visit | Attending: General Surgery | Admitting: General Surgery

## 2018-08-13 ENCOUNTER — Telehealth: Payer: Self-pay

## 2018-08-13 DIAGNOSIS — K430 Incisional hernia with obstruction, without gangrene: Secondary | ICD-10-CM | POA: Diagnosis not present

## 2018-08-13 DIAGNOSIS — Z79899 Other long term (current) drug therapy: Secondary | ICD-10-CM | POA: Diagnosis not present

## 2018-08-13 DIAGNOSIS — K429 Umbilical hernia without obstruction or gangrene: Secondary | ICD-10-CM | POA: Diagnosis not present

## 2018-08-13 DIAGNOSIS — E669 Obesity, unspecified: Secondary | ICD-10-CM | POA: Diagnosis not present

## 2018-08-13 DIAGNOSIS — G4730 Sleep apnea, unspecified: Secondary | ICD-10-CM | POA: Insufficient documentation

## 2018-08-13 DIAGNOSIS — Z79890 Hormone replacement therapy (postmenopausal): Secondary | ICD-10-CM | POA: Insufficient documentation

## 2018-08-13 DIAGNOSIS — Z7982 Long term (current) use of aspirin: Secondary | ICD-10-CM | POA: Insufficient documentation

## 2018-08-13 DIAGNOSIS — Z9981 Dependence on supplemental oxygen: Secondary | ICD-10-CM | POA: Insufficient documentation

## 2018-08-13 DIAGNOSIS — J449 Chronic obstructive pulmonary disease, unspecified: Secondary | ICD-10-CM | POA: Diagnosis not present

## 2018-08-13 DIAGNOSIS — F419 Anxiety disorder, unspecified: Secondary | ICD-10-CM | POA: Insufficient documentation

## 2018-08-13 DIAGNOSIS — I509 Heart failure, unspecified: Secondary | ICD-10-CM | POA: Insufficient documentation

## 2018-08-13 DIAGNOSIS — I5032 Chronic diastolic (congestive) heart failure: Secondary | ICD-10-CM | POA: Diagnosis not present

## 2018-08-13 DIAGNOSIS — K436 Other and unspecified ventral hernia with obstruction, without gangrene: Secondary | ICD-10-CM | POA: Insufficient documentation

## 2018-08-13 DIAGNOSIS — Z6841 Body Mass Index (BMI) 40.0 and over, adult: Secondary | ICD-10-CM | POA: Insufficient documentation

## 2018-08-13 DIAGNOSIS — K439 Ventral hernia without obstruction or gangrene: Secondary | ICD-10-CM

## 2018-08-13 DIAGNOSIS — F17210 Nicotine dependence, cigarettes, uncomplicated: Secondary | ICD-10-CM | POA: Insufficient documentation

## 2018-08-13 DIAGNOSIS — Z791 Long term (current) use of non-steroidal anti-inflammatories (NSAID): Secondary | ICD-10-CM | POA: Diagnosis not present

## 2018-08-13 DIAGNOSIS — F329 Major depressive disorder, single episode, unspecified: Secondary | ICD-10-CM | POA: Diagnosis not present

## 2018-08-13 DIAGNOSIS — I110 Hypertensive heart disease with heart failure: Secondary | ICD-10-CM | POA: Insufficient documentation

## 2018-08-13 DIAGNOSIS — E039 Hypothyroidism, unspecified: Secondary | ICD-10-CM | POA: Diagnosis not present

## 2018-08-13 HISTORY — PX: EPIGASTRIC HERNIA REPAIR: SHX404

## 2018-08-13 HISTORY — PX: UMBILICAL HERNIA REPAIR: SHX196

## 2018-08-13 HISTORY — DX: Other intervertebral disc degeneration, lumbosacral region without mention of lumbar back pain or lower extremity pain: M51.379

## 2018-08-13 HISTORY — DX: Other intervertebral disc degeneration, lumbosacral region: M51.37

## 2018-08-13 SURGERY — REPAIR, HERNIA, EPIGASTRIC, ADULT
Anesthesia: General

## 2018-08-13 MED ORDER — OXYCODONE HCL 5 MG PO TABS
ORAL_TABLET | ORAL | Status: AC
  Filled 2018-08-13: qty 1

## 2018-08-13 MED ORDER — ACETAMINOPHEN 10 MG/ML IV SOLN
INTRAVENOUS | Status: DC | PRN
  Administered 2018-08-13: 1000 mg via INTRAVENOUS

## 2018-08-13 MED ORDER — HYDROCODONE-ACETAMINOPHEN 5-325 MG PO TABS: 1.0000 | ORAL_TABLET | ORAL | 0 refills | Status: DC | PRN

## 2018-08-13 MED ORDER — MEPERIDINE HCL 50 MG/ML IJ SOLN: 6.2500 mg | INTRAMUSCULAR | Status: DC | PRN

## 2018-08-13 MED ORDER — CEFAZOLIN SODIUM-DEXTROSE 2-4 GM/100ML-% IV SOLN
INTRAVENOUS | Status: AC
  Filled 2018-08-13: qty 100

## 2018-08-13 MED ORDER — MIDAZOLAM HCL 2 MG/2ML IJ SOLN
INTRAMUSCULAR | Status: DC | PRN
  Administered 2018-08-13: 2 mg via INTRAVENOUS

## 2018-08-13 MED ORDER — ONDANSETRON HCL 4 MG/2ML IJ SOLN
INTRAMUSCULAR | Status: DC | PRN
  Administered 2018-08-13: 4 mg via INTRAVENOUS

## 2018-08-13 MED ORDER — IPRATROPIUM-ALBUTEROL 0.5-2.5 (3) MG/3ML IN SOLN
3.0000 mL | Freq: Once | RESPIRATORY_TRACT | Status: AC
  Administered 2018-08-13: 3 mL via RESPIRATORY_TRACT

## 2018-08-13 MED ORDER — PROMETHAZINE HCL 25 MG/ML IJ SOLN: 6.2500 mg | INTRAMUSCULAR | Status: DC | PRN

## 2018-08-13 MED ORDER — DEXAMETHASONE SODIUM PHOSPHATE 10 MG/ML IJ SOLN
INTRAMUSCULAR | Status: DC | PRN
  Administered 2018-08-13: 10 mg via INTRAVENOUS

## 2018-08-13 MED ORDER — KETAMINE HCL 50 MG/ML IJ SOLN
INTRAMUSCULAR | Status: AC
  Filled 2018-08-13: qty 10

## 2018-08-13 MED ORDER — KETOROLAC TROMETHAMINE 30 MG/ML IJ SOLN
INTRAMUSCULAR | Status: DC | PRN
  Administered 2018-08-13: 30 mg via INTRAVENOUS

## 2018-08-13 MED ORDER — LACTATED RINGERS IV SOLN
INTRAVENOUS | Status: DC
  Administered 2018-08-13: 20 mL/h via INTRAVENOUS

## 2018-08-13 MED ORDER — OXYCODONE HCL 5 MG/5ML PO SOLN: 5.0000 mg | Freq: Once | ORAL | Status: AC | PRN

## 2018-08-13 MED ORDER — MIDAZOLAM HCL 2 MG/2ML IJ SOLN
INTRAMUSCULAR | Status: AC
  Filled 2018-08-13: qty 2

## 2018-08-13 MED ORDER — CEFAZOLIN SODIUM-DEXTROSE 2-4 GM/100ML-% IV SOLN
2.0000 g | INTRAVENOUS | Status: AC
  Administered 2018-08-13: 2 g via INTRAVENOUS

## 2018-08-13 MED ORDER — PROPOFOL 10 MG/ML IV BOLUS
INTRAVENOUS | Status: DC | PRN
  Administered 2018-08-13: 150 mg via INTRAVENOUS

## 2018-08-13 MED ORDER — FENTANYL CITRATE (PF) 100 MCG/2ML IJ SOLN
25.0000 ug | INTRAMUSCULAR | Status: DC | PRN
  Administered 2018-08-13 (×4): 25 ug via INTRAVENOUS

## 2018-08-13 MED ORDER — OXYCODONE HCL 5 MG PO TABS
5.0000 mg | ORAL_TABLET | Freq: Once | ORAL | Status: AC | PRN
  Administered 2018-08-13: 5 mg via ORAL

## 2018-08-13 MED ORDER — IPRATROPIUM-ALBUTEROL 0.5-2.5 (3) MG/3ML IN SOLN
RESPIRATORY_TRACT | Status: AC
  Filled 2018-08-13: qty 3

## 2018-08-13 MED ORDER — BUPIVACAINE-EPINEPHRINE (PF) 0.5% -1:200000 IJ SOLN
INTRAMUSCULAR | Status: AC
  Filled 2018-08-13: qty 30

## 2018-08-13 MED ORDER — LIDOCAINE HCL (CARDIAC) PF 100 MG/5ML IV SOSY
PREFILLED_SYRINGE | INTRAVENOUS | Status: DC | PRN
  Administered 2018-08-13: 100 mg via INTRAVENOUS

## 2018-08-13 MED ORDER — BISOPROLOL FUMARATE 5 MG PO TABS
10.0000 mg | ORAL_TABLET | Freq: Once | ORAL | Status: AC
  Administered 2018-08-13: 10 mg via ORAL
  Filled 2018-08-13: qty 2

## 2018-08-13 MED ORDER — FENTANYL CITRATE (PF) 100 MCG/2ML IJ SOLN
INTRAMUSCULAR | Status: AC
  Administered 2018-08-13: 25 ug via INTRAVENOUS
  Filled 2018-08-13: qty 2

## 2018-08-13 MED ORDER — BUPIVACAINE-EPINEPHRINE 0.5% -1:200000 IJ SOLN
INTRAMUSCULAR | Status: DC | PRN
  Administered 2018-08-13: 30 mL

## 2018-08-13 MED ORDER — KETAMINE HCL 10 MG/ML IJ SOLN
INTRAMUSCULAR | Status: DC | PRN
  Administered 2018-08-13: 10 mg via INTRAVENOUS
  Administered 2018-08-13: 20 mg via INTRAVENOUS

## 2018-08-13 MED ORDER — PHENYLEPHRINE HCL 10 MG/ML IJ SOLN
INTRAMUSCULAR | Status: DC | PRN
  Administered 2018-08-13: 100 ug via INTRAVENOUS
  Administered 2018-08-13 (×3): 200 ug via INTRAVENOUS
  Administered 2018-08-13 (×2): 100 ug via INTRAVENOUS
  Administered 2018-08-13: 200 ug via INTRAVENOUS

## 2018-08-13 MED ORDER — SEVOFLURANE IN SOLN
RESPIRATORY_TRACT | Status: AC
  Filled 2018-08-13: qty 250

## 2018-08-13 MED ORDER — BUPIVACAINE HCL (PF) 0.5 % IJ SOLN
INTRAMUSCULAR | Status: AC
  Filled 2018-08-13: qty 30

## 2018-08-13 MED ORDER — LACTATED RINGERS IV SOLN
INTRAVENOUS | Status: DC | PRN
  Administered 2018-08-13: 09:00:00 via INTRAVENOUS

## 2018-08-13 MED ORDER — FENTANYL CITRATE (PF) 250 MCG/5ML IJ SOLN
INTRAMUSCULAR | Status: AC
  Filled 2018-08-13: qty 5

## 2018-08-13 SURGICAL SUPPLY — 39 items
BLADE SURG 15 STRL SS SAFETY (BLADE) ×4 IMPLANT
CANISTER SUCT 1200ML W/VALVE (MISCELLANEOUS) ×2 IMPLANT
CHLORAPREP W/TINT 26ML (MISCELLANEOUS) ×2 IMPLANT
COVER WAND RF STERILE (DRAPES) IMPLANT
DRAPE LAPAROTOMY 100X77 ABD (DRAPES) ×2 IMPLANT
DRSG OPSITE POSTOP 3X4 (GAUZE/BANDAGES/DRESSINGS) ×4 IMPLANT
DRSG TEGADERM 4X4.75 (GAUZE/BANDAGES/DRESSINGS) ×2 IMPLANT
DRSG TELFA 4X3 1S NADH ST (GAUZE/BANDAGES/DRESSINGS) ×2 IMPLANT
ELECT REM PT RETURN 9FT ADLT (ELECTROSURGICAL) ×2
ELECTRODE REM PT RTRN 9FT ADLT (ELECTROSURGICAL) ×1 IMPLANT
GLOVE BIO SURGEON STRL SZ7.5 (GLOVE) ×4 IMPLANT
GLOVE INDICATOR 8.0 STRL GRN (GLOVE) ×4 IMPLANT
GOWN STRL REUS W/ TWL LRG LVL3 (GOWN DISPOSABLE) ×2 IMPLANT
GOWN STRL REUS W/TWL LRG LVL3 (GOWN DISPOSABLE) ×2
KIT TURNOVER KIT A (KITS) ×2 IMPLANT
LABEL OR SOLS (LABEL) ×2 IMPLANT
MESH HERNIA 3X6 (Mesh General) ×2 IMPLANT
NEEDLE HYPO 22GX1.5 SAFETY (NEEDLE) ×2 IMPLANT
NEEDLE HYPO 25X1 1.5 SAFETY (NEEDLE) ×2 IMPLANT
NS IRRIG 500ML POUR BTL (IV SOLUTION) ×2 IMPLANT
PACK BASIN MINOR ARMC (MISCELLANEOUS) ×2 IMPLANT
SPONGE LAP 18X18 RF (DISPOSABLE) ×2 IMPLANT
STAPLER SKIN PROX 35W (STAPLE) IMPLANT
STRIP CLOSURE SKIN 1/2X4 (GAUZE/BANDAGES/DRESSINGS) ×2 IMPLANT
SUT PROLENE 0 CT 1 30 (SUTURE) IMPLANT
SUT SURGILON 0 BLK (SUTURE) ×4 IMPLANT
SUT VIC AB 2-0 BRD 54 (SUTURE) IMPLANT
SUT VIC AB 2-0 CT1 27 (SUTURE) ×1
SUT VIC AB 2-0 CT1 TAPERPNT 27 (SUTURE) ×1 IMPLANT
SUT VIC AB 3-0 54X BRD REEL (SUTURE) ×1 IMPLANT
SUT VIC AB 3-0 BRD 54 (SUTURE) ×1
SUT VIC AB 3-0 SH 27 (SUTURE) ×1
SUT VIC AB 3-0 SH 27X BRD (SUTURE) ×1 IMPLANT
SUT VIC AB 4-0 FS2 27 (SUTURE) ×4 IMPLANT
SUT VICRYL+ 3-0 144IN (SUTURE) IMPLANT
SWABSTK COMLB BENZOIN TINCTURE (MISCELLANEOUS) ×2 IMPLANT
SYR 10ML LL (SYRINGE) ×2 IMPLANT
SYR 3ML LL SCALE MARK (SYRINGE) IMPLANT
SYR CONTROL 10ML (SYRINGE) ×2 IMPLANT

## 2018-08-13 NOTE — H&P (Signed)
No change in clinical condition or exam. For epigastric and umbilical hernia repair.

## 2018-08-13 NOTE — Progress Notes (Signed)
Wheezing   Duo neb given

## 2018-08-13 NOTE — Telephone Encounter (Signed)
Spoke with patient and she had surgery with Dr Fleet Contras.  She did take a surgeons letter to him prior to surgery.  Called back to pharmacy to let them know it is okay to fill his pain medication.  Only problem is that he wrote for the exact same medicine that [facility] wrote for her for chronic pain.

## 2018-08-13 NOTE — Telephone Encounter (Signed)
She wanted to let us know that Dr. Marlyn Corporal wrote her a script for norco and they know that Dr. Dossie Arbour writes that too, so they wanted to make sure it was ok for them to fill it. Please call back

## 2018-08-13 NOTE — Discharge Instructions (Signed)

## 2018-08-13 NOTE — OR Nursing (Signed)
Per Dr. Bary Castilla via tele 12:20, ok to discharge pt to home without his visit to postop.

## 2018-08-13 NOTE — Op Note (Signed)
Preoperative diagnosis: Epigastric and umbilical hernia.  Postoperative diagnosis: Same.  Operative procedure: Repair of umbilical and epigastric hernia with Prolene mesh.  Operating Surgeon: Hervey Ard, MD.  Anesthesia: General by LMA, Marcaine 0.5% with 1 to 200,000 units of epinephrine, 30 cc.  Clinical note: This 62 year old woman is developed a symptomatic epigastric hernia with incarcerated fat as well as a umbilical hernia.  She was felt to be a candidate for elective repair.  She was seen in consultation by both her cardiologist and pulmonologist prior to the procedure.  Operative note: The patient received Kefzol prior to the procedure.  SCD stockings for DVT prevention.  The patient underwent induction of general anesthesia and tolerated this well.  The abdomen was cleansed with ChloraPrep and draped.  Field block anesthesia was achieved with Marcaine with epinephrine, 0.5%.  This was kept well away from the incisions to minimize skin ischemia.  The area of the epigastric defect was 8 cm above the umbilicus.  A small transverse incision at this location was made carried down to skin subcutaneous tissue.  Hemostasis was electrocautery.  The fascial defect was identified and the protruding fat amputated with 3-0 Vicryl ties for hemostasis.  A secondary defect just 5 mm in diameter was found just to the right of the midline inferior to the major defect.  The entire defect was about 2 cm in diameter and 1 cm in width.  A 3 x 3 inch piece of polypropylene mesh was smoothed into the preperitoneal space and anchored with 4 corner trans-fascial sutures of 0 Surgilon.  The fascial defect was closed with interrupted 0 Surgilon sutures taking a bite of the mesh with each suture passed to anchored in position.  Adipose tissue was approximated in layers with 2-0 Vicryl figure-of-eight sutures.  The skin was closed with a running 4-0 Vicryl subcuticular suture.  Attention was turned to the  umbilical area.  An infraumbilical incision was made and the skin flap elevated to dissect the umbilical skin off the hernia sac.  The hernia sac was then freed circumferentially at the fascial level for a total of 5 cm width.  A 5 cm diameter piece of mesh was placed in the preperitoneal space and anchored in 4 corner sutures as noted above.  The fascial defect which was about 2 cm in diameter was then approximated with as noted above for the epigastric defect.  The adipose tissue was approximated in layers with a running 3-0 Vicryl suture.  Skin was closed with a running 4-0 Vicryl subcuticular suture.  Benzoin and Steri-Strips followed by a honeycomb dressings were applied.  Patient tolerated the procedure well and was taken to recovery room in stable condition.

## 2018-08-13 NOTE — Anesthesia Procedure Notes (Signed)
Procedure Name: LMA Insertion Date/Time: 08/13/2018 8:53 AM Performed by: Willette Alma, CRNA Pre-anesthesia Checklist: Patient identified, Patient being monitored, Timeout performed, Emergency Drugs available and Suction available Patient Re-evaluated:Patient Re-evaluated prior to induction Oxygen Delivery Method: Circle system utilized Preoxygenation: Pre-oxygenation with 100% oxygen Induction Type: IV induction Ventilation: Mask ventilation without difficulty LMA: LMA inserted LMA Size: 5.0 Tube type: Oral Number of attempts: 1 Placement Confirmation: positive ETCO2 and breath sounds checked- equal and bilateral Tube secured with: Tape Dental Injury: Teeth and Oropharynx as per pre-operative assessment

## 2018-08-13 NOTE — Transfer of Care (Signed)
Immediate Anesthesia Transfer of Care Note  Patient: Theresa [name]  Procedure(s) Performed: HERNIA REPAIR EPIGASTRIC ADULT (N/A ) HERNIA REPAIR UMBILICAL ADULT (N/A )  Patient Location: PACU  Anesthesia Type:General  Level of Consciousness: awake, alert  and oriented  Airway & Oxygen Therapy: Patient Spontanous Breathing and Patient connected to face mask oxygen  Post-op Assessment: Report given to RN and Post -op Vital signs reviewed and stable  Post vital signs: Reviewed and stable  Last Vitals:  Vitals Value Taken Time  BP    Temp 36.4 C 08/13/2018 10:04 AM  Pulse 78 08/13/2018 10:06 AM  Resp 22 08/13/2018 10:06 AM  SpO2 97 % 08/13/2018 10:06 AM  Vitals shown include unvalidated device data.  Last Pain:  Vitals:   08/13/18 0752  TempSrc: Oral  PainSc:       Patients Stated Pain Goal: 0 (81/44/81 8563)  Complications: No apparent anesthesia complications

## 2018-08-13 NOTE — Anesthesia Preprocedure Evaluation (Signed)
Anesthesia Evaluation  Patient identified by MRN, date of birth, ID band Patient awake    Reviewed: Allergy & Precautions, NPO status , Patient's Chart, lab work & pertinent test results  History of Anesthesia Complications Negative for: history of anesthetic complications  Airway Mallampati: III  TM Distance: >3 FB Neck ROM: Full    Dental no notable dental hx.    Pulmonary sleep apnea (BiPAP) and Oxygen sleep apnea , COPD,  COPD inhaler and oxygen dependent, Current Smoker,    breath sounds clear to auscultation- rhonchi (-) wheezing      Cardiovascular hypertension, +CHF  (-) CAD, (-) Past MI, (-) Cardiac Stents and (-) CABG  Rhythm:Regular Rate:Normal - Systolic murmurs and - Diastolic murmurs Echo 0/35/00: - Left ventricle: The cavity size was mildly dilated. Systolic   function was normal. The estimated ejection fraction was in the   range of 55% to 60%. Doppler parameters are consistent with   abnormal left ventricular relaxation (grade 1 diastolic   dysfunction). - Aortic valve: There was trivial regurgitation. - Left atrium: The atrium was mildly to moderately dilated. - Right atrium: The atrium was mildly dilated. - Pulmonary arteries: PA peak pressure: 58 mm Hg (S).    Neuro/Psych PSYCHIATRIC DISORDERS Anxiety Depression    GI/Hepatic Neg liver ROS, GERD  ,  Endo/Other  neg diabetesHypothyroidism   Renal/GU Renal disease: hx of nephrolithiasis.     Musculoskeletal  (+) Arthritis ,   Abdominal (+) + obese,   Peds  Hematology negative hematology ROS (+)   Anesthesia Other Findings Past Medical History: 10/03/2017: Acute postoperative pain No date: Anxiety No date: BiPAP (biphasic positive airway pressure) dependence     Comment:  at hs No date: Carpal tunnel syndrome No date: CHF (congestive heart failure) (HCC)     Comment:  1/18 No date: CHF (congestive heart failure) (HCC) No date: Chronic  generalized abdominal pain No date: Chronic rhinitis No date: COPD (chronic obstructive pulmonary disease) (HCC) No date: DDD (degenerative disc disease), cervical No date: DDD (degenerative disc disease), lumbosacral No date: Depression No date: Dyspnea No date: Edema No date: Flu     Comment:  1/18 No date: Gastritis No date: GERD (gastroesophageal reflux disease) No date: Hematuria No date: Hernia, abdominal No date: Hypertension No date: Kidney stones No date: Low back pain No date: Lumbar radiculitis No date: Malodorous urine No date: Muscle weakness No date: Obesity No date: Oxygen dependent No date: Renal cyst No date: Sensory urge incontinence No date: Thyroid activity decreased     Comment:  9/19 No date: Tobacco abuse No date: Wheezing   Reproductive/Obstetrics                             Anesthesia Physical Anesthesia Plan  ASA: IV  Anesthesia Plan: General   Post-op Pain Management:    Induction: Intravenous  PONV Risk Score and Plan: 1 and Ondansetron  Airway Management Planned: LMA  Additional Equipment:   Intra-op Plan:   Post-operative Plan:   Informed Consent: I have reviewed the patients History and Physical, chart, labs and discussed the procedure including the risks, benefits and alternatives for the proposed anesthesia with the patient or authorized representative who has indicated his/her understanding and acceptance.   Dental advisory given  Plan Discussed with: CRNA and Anesthesiologist  Anesthesia Plan Comments:         Anesthesia Quick Evaluation

## 2018-08-13 NOTE — Anesthesia Postprocedure Evaluation (Signed)
Anesthesia Post Note  Patient: Madisson Kulaga  Procedure(s) Performed: HERNIA REPAIR EPIGASTRIC ADULT (N/A ) HERNIA REPAIR UMBILICAL ADULT (N/A )  Patient location during evaluation: PACU Anesthesia Type: General Level of consciousness: awake and alert and oriented Pain management: pain level controlled Vital Signs Assessment: post-procedure vital signs reviewed and stable Respiratory status: spontaneous breathing, nonlabored ventilation and respiratory function stable Cardiovascular status: blood pressure returned to baseline and stable Postop Assessment: no signs of nausea or vomiting Anesthetic complications: no     Last Vitals:  Vitals:   08/13/18 1137 08/13/18 1220  BP: 102/66 (!) 102/51  Pulse: 79 77  Resp: 18 16  Temp:    SpO2: 93% 94%    Last Pain:  Vitals:   08/13/18 1137  TempSrc:   PainSc: 7                  Inez Stantz

## 2018-08-13 NOTE — Anesthesia Post-op Follow-up Note (Signed)
Anesthesia QCDR form completed.        

## 2018-08-14 DIAGNOSIS — K469 Unspecified abdominal hernia without obstruction or gangrene: Secondary | ICD-10-CM | POA: Diagnosis not present

## 2018-08-16 DIAGNOSIS — K469 Unspecified abdominal hernia without obstruction or gangrene: Secondary | ICD-10-CM | POA: Diagnosis not present

## 2018-08-16 IMAGING — CT CT CHEST LUNG CANCER SCREENING LOW DOSE W/O CM
2 of 5 series · 15 of 40 positions shown, 18 images · non-contrast
Comparison: 10/24/2016 chest CT angiogram. 12/11/2017 chest
radiograph.

CLINICAL DATA: 61-year-old asymptomatic female current smoker with
44 pack-year smoking history.

EXAM:
CT CHEST WITHOUT CONTRAST LOW-DOSE FOR LUNG CANCER SCREENING
TECHNIQUE: Multidetector CT imaging of the chest was performed following the
standard protocol without IV contrast.

[Series 3: lung · axial · 0.63mm/px · z∈[-1178,-893]mm · 12 of 316 slices shown, 15 images (1 of 2)]
[im 16/316  mediastinal]
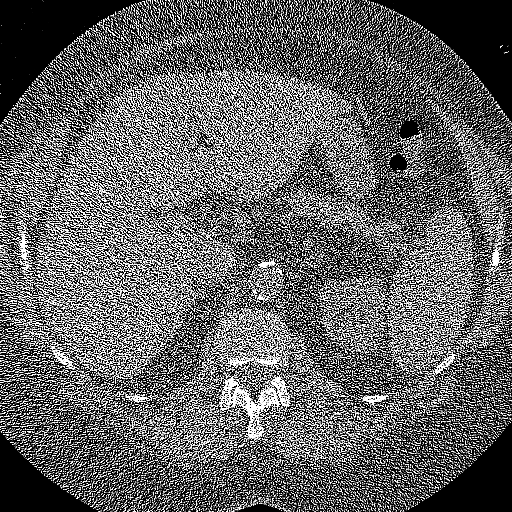
[im 16/316  lung]
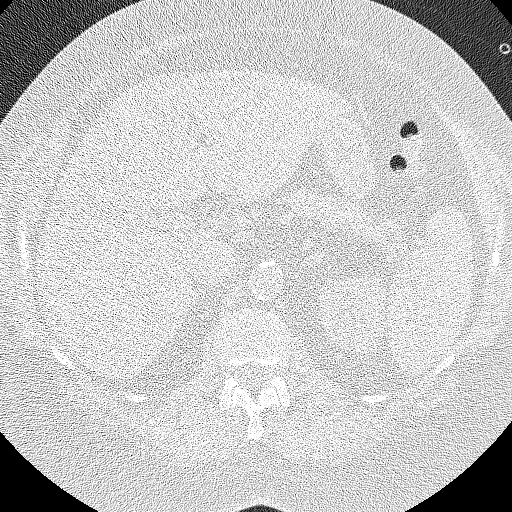
[im 46/316  lung]
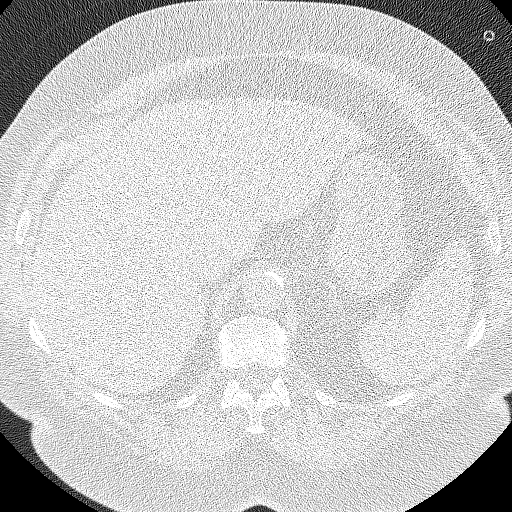
[im 76/316  lung]
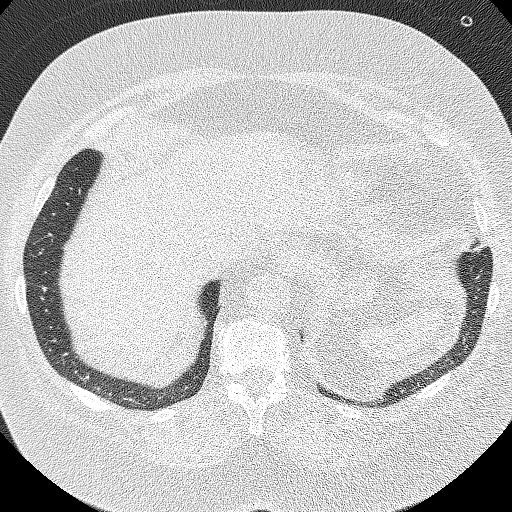
[im 91/316  lung]
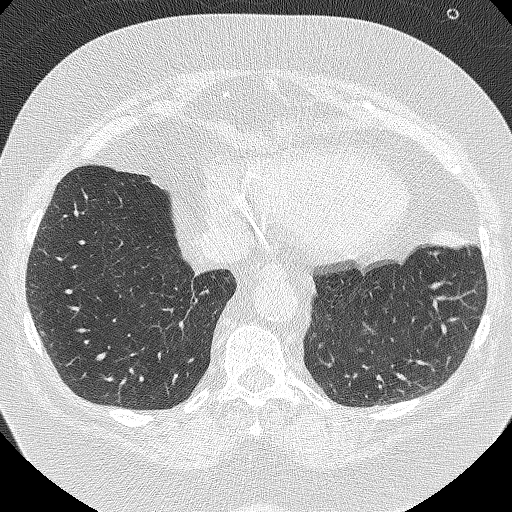
[im 121/316  mediastinal]
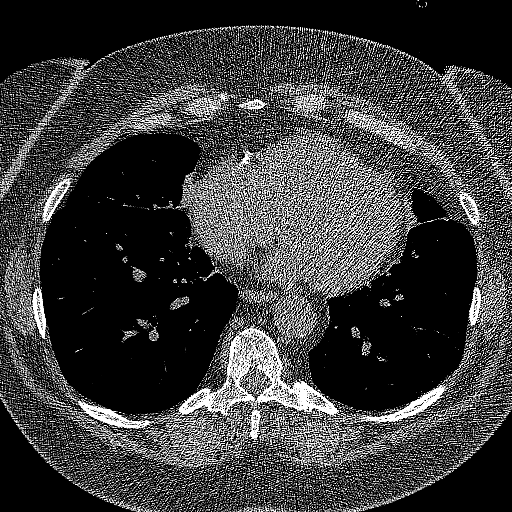
[im 121/316  lung]
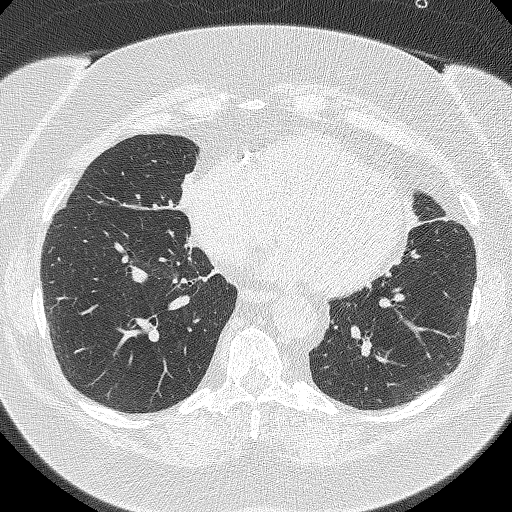
[im 151/316  lung]
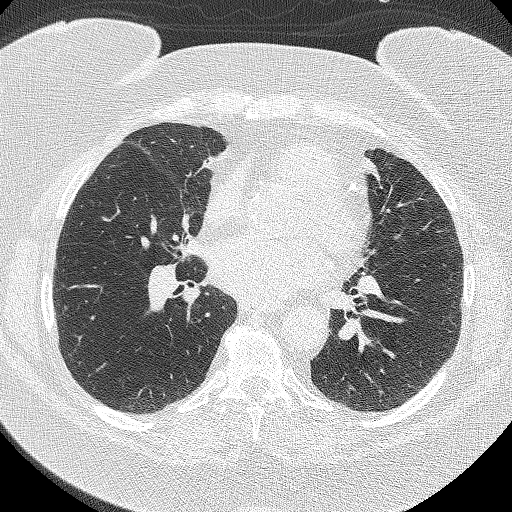
[im 166/316  lung]
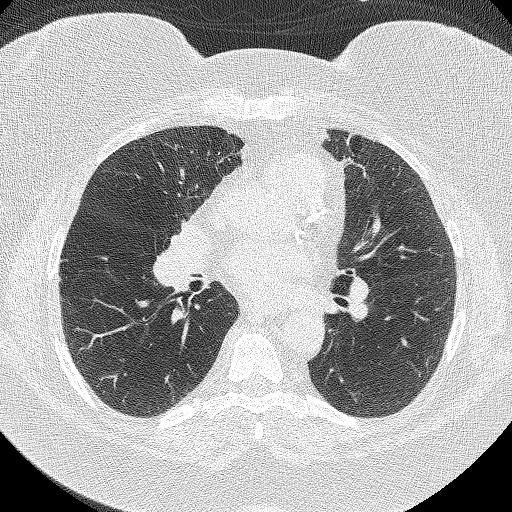
[im 196/316  lung]
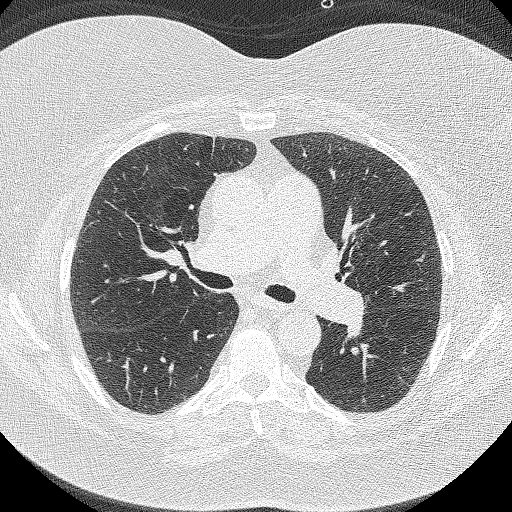
[im 226/316  mediastinal]
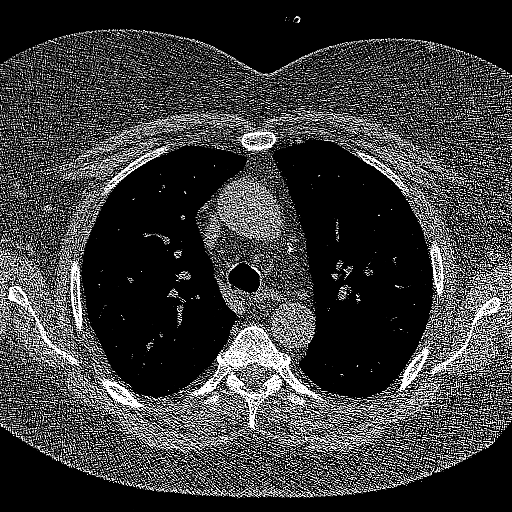
[im 226/316  lung]
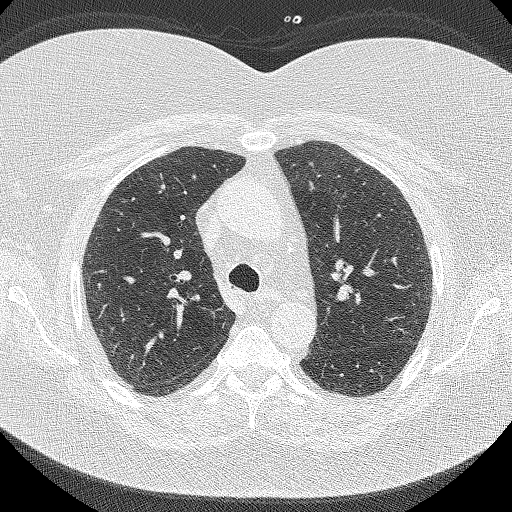
[im 241/316  lung]
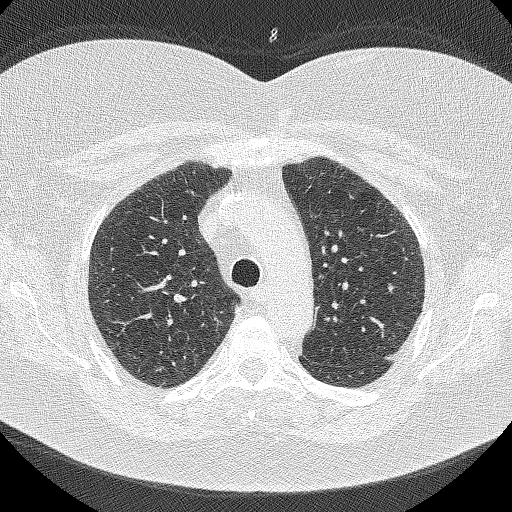
[im 271/316  lung]
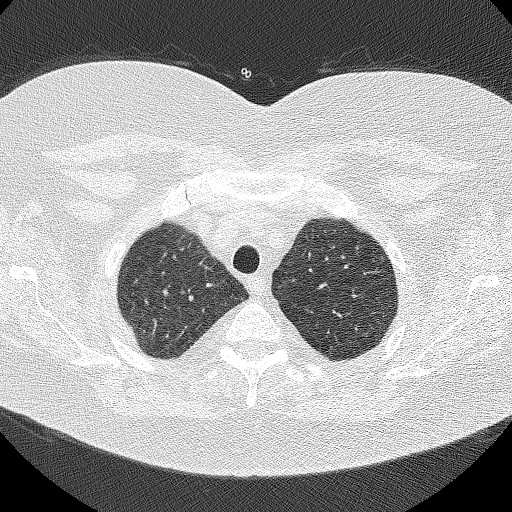
[im 301/316  lung]
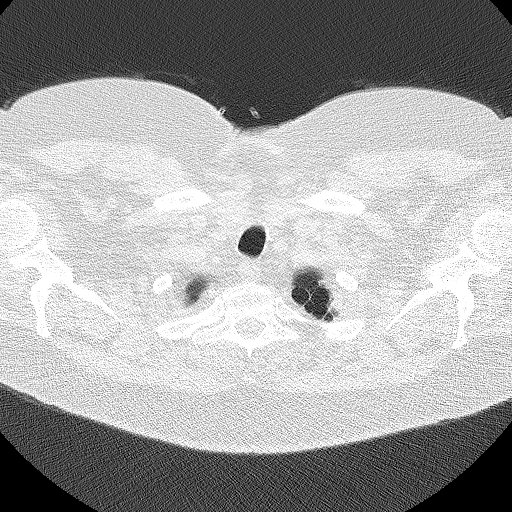

[Series 4: lung · coronal · 0.62mm/px · 3 of 324 slices shown (2 of 2)]
[im 65/324  lung]
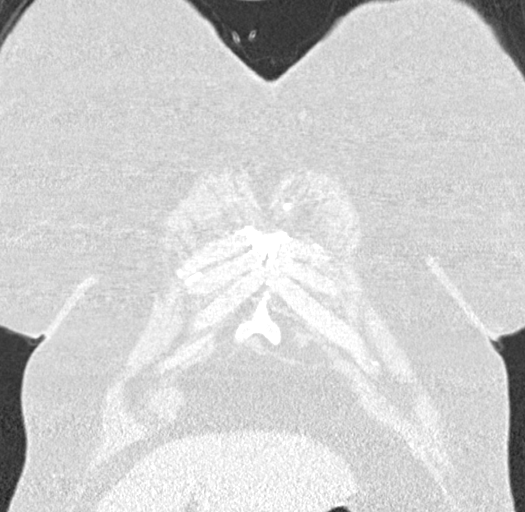
[im 130/324  lung]
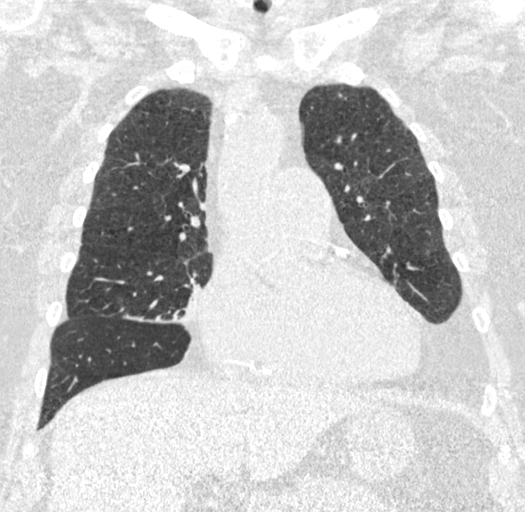
[im 194/324  lung]
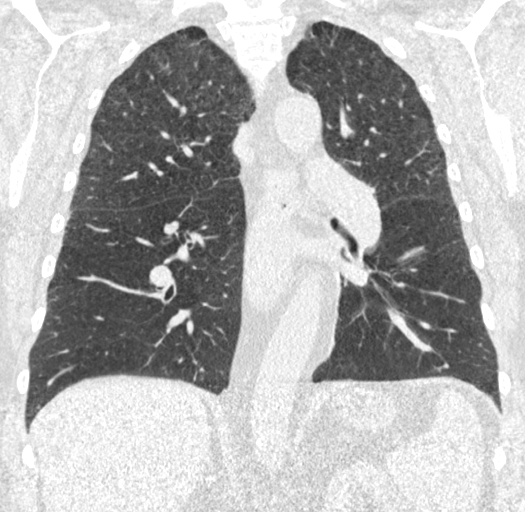

[15 of 40 positions shown; findings below may reference images not displayed]

FINDINGS: Cardiovascular: Normal heart size. No significant pericardial
fluid/thickening. Three-vessel coronary atherosclerosis.
Atherosclerotic nonaneurysmal thoracic aorta. Dilated main pulmonary
artery (3.5 cm diameter), stable.

Mediastinum/Nodes: No discrete thyroid nodules. Unremarkable
esophagus. No pathologically enlarged axillary, mediastinal or gross
hilar lymph nodes, noting limited sensitivity for the detection of
hilar adenopathy on this noncontrast study.

Lungs/Pleura: No pneumothorax. No pleural effusion. Mild
centrilobular emphysema with mild diffuse bronchial wall thickening.
Stable postinfectious/postinflammatory scarring in the right middle
lobe and lingula. No acute consolidative airspace disease or lung
masses. Two tiny scattered solid pulmonary nodules, largest 3.3 mm
in volume derived mean diameter in the right lower lobe (series
3/image 200).

Upper abdomen: Numerous subcentimeter hypodense lesions scattered
throughout the liver are too small to characterize and not
appreciably changed since 10/24/2016 chest CT, probably benign.

Musculoskeletal: No aggressive appearing focal osseous lesions.
Moderate thoracic spondylosis.
IMPRESSION: 1. Lung-RADS 2, benign appearance or behavior. Continue annual
screening with low-dose chest CT without contrast in 12 months.
2. Three-vessel coronary atherosclerosis.
3. Stable dilated main pulmonary artery, suggesting chronic
pulmonary arterial hypertension.

Aortic Atherosclerosis (DD7DF-QD2.2) and Emphysema (DD7DF-S31.6).

## 2018-08-17 DIAGNOSIS — J449 Chronic obstructive pulmonary disease, unspecified: Secondary | ICD-10-CM | POA: Diagnosis not present

## 2018-08-17 DIAGNOSIS — F329 Major depressive disorder, single episode, unspecified: Secondary | ICD-10-CM | POA: Diagnosis not present

## 2018-08-17 DIAGNOSIS — E669 Obesity, unspecified: Secondary | ICD-10-CM | POA: Diagnosis not present

## 2018-08-17 DIAGNOSIS — Z48815 Encounter for surgical aftercare following surgery on the digestive system: Secondary | ICD-10-CM | POA: Diagnosis not present

## 2018-08-17 DIAGNOSIS — Z87891 Personal history of nicotine dependence: Secondary | ICD-10-CM | POA: Diagnosis not present

## 2018-08-17 DIAGNOSIS — I5032 Chronic diastolic (congestive) heart failure: Secondary | ICD-10-CM | POA: Diagnosis not present

## 2018-08-17 DIAGNOSIS — Z9981 Dependence on supplemental oxygen: Secondary | ICD-10-CM | POA: Diagnosis not present

## 2018-08-17 DIAGNOSIS — I110 Hypertensive heart disease with heart failure: Secondary | ICD-10-CM | POA: Diagnosis not present

## 2018-08-17 DIAGNOSIS — Z6841 Body Mass Index (BMI) 40.0 and over, adult: Secondary | ICD-10-CM | POA: Diagnosis not present

## 2018-08-20 ENCOUNTER — Other Ambulatory Visit: Payer: Self-pay | Admitting: Family Medicine

## 2018-08-21 ENCOUNTER — Other Ambulatory Visit: Payer: Self-pay

## 2018-08-21 ENCOUNTER — Ambulatory Visit (INDEPENDENT_AMBULATORY_CARE_PROVIDER_SITE_OTHER): Payer: Medicare HMO | Admitting: General Surgery

## 2018-08-21 ENCOUNTER — Encounter: Payer: Self-pay | Admitting: General Surgery

## 2018-08-21 VITALS — BP 119/73 | HR 78 | Temp 91.2°F | Resp 13 | Ht 63.0 in | Wt 249.2 lb

## 2018-08-21 DIAGNOSIS — K439 Ventral hernia without obstruction or gangrene: Secondary | ICD-10-CM

## 2018-08-21 NOTE — Progress Notes (Addendum)
Patient ID: Theresa [name], female   DOB: 1956/09/09, 62 y.o.   MRN: [id number]  Chief Complaint  Patient presents with  . Routine Post Op    7-10 post op epigastric hernia repair sx 08/13/18    HPI [name] is a 62 y.o. female here today for her post op epigastric hernia repair sx date 08/13/18. Patient states she has no complaints and is feeling well. She is here today with her husband Ovid Curd.  HPI  Past Medical History:  Diagnosis Date  . Acute postoperative pain 10/03/2017  . Anxiety   . BiPAP (biphasic positive airway pressure) dependence    at hs  . Carpal tunnel syndrome   . CHF (congestive heart failure) ([place])    1/18  . CHF (congestive heart failure) ([place])   . Chronic generalized abdominal pain   . Chronic rhinitis   . COPD (chronic obstructive pulmonary disease) ([place])   . DDD (degenerative disc disease), cervical   . DDD (degenerative disc disease), lumbosacral   . Depression   . Dyspnea   . Edema   . Flu    1/18  . Gastritis   . GERD (gastroesophageal reflux disease)   . Hematuria   . Hernia, abdominal   . Hypertension   . Kidney stones   . Low back pain   . Lumbar radiculitis   . Malodorous urine   . Muscle weakness   . Obesity   . Oxygen dependent   . Renal cyst   . Sensory urge incontinence   . Thyroid activity decreased    9/19  . Tobacco abuse   . Wheezing     Past Surgical History:  Procedure Laterality Date  . ABDOMINAL HYSTERECTOMY    . CARPAL TUNNEL RELEASE Left 2012  . EPIGASTRIC HERNIA REPAIR N/A 08/13/2018   HERNIA REPAIR EPIGASTRIC ADULT; polypropylene mesh reinforcement.  Surgeon: Robert Bellow, MD;  Location: [facility] ORS;  Service: General;  Laterality: N/A;  . TONSILLECTOMY    . TUBAL LIGATION    . UMBILICAL HERNIA REPAIR N/A 08/13/2018   HERNIA REPAIR UMBILICAL ADULT; polypropylene mesh reinforcement Surgeon: Robert Bellow, MD;  Location: [facility] ORS;  Service: General;  Laterality: N/A;    Family History  Problem  Relation Age of Onset  . Heart disease Mother   . Stroke Mother   . Coronary artery disease Mother   . Lung cancer Sister   . Cancer Sister   . Breast cancer Sister   . Alcohol abuse Father   . Heart disease Father   . Cancer Brother   . Cancer Brother   . Pneumonia Brother   . Prostate cancer Neg Hx   . Kidney cancer Neg Hx   . Bladder Cancer Neg Hx     Social History Social History   Tobacco Use  . Smoking status: Current Every Day Smoker    Packs/day: 1.00    Years: 44.00    Pack years: 44.00    Types: Cigarettes  . Smokeless tobacco: Never Used  Substance Use Topics  . Alcohol use: No    Frequency: Never  . Drug use: No    Allergies  Allergen Reactions  . Codeine Nausea And Vomiting  . Meloxicam Other (See Comments)    Stomach pain    Current Outpatient Medications  Medication Sig Dispense Refill  . ADVAIR DISKUS 250-50 MCG/DOSE AEPB Inhale 1 puff into the lungs 2 (two) times daily.  3  . ALPRAZolam (XANAX) 0.5 MG tablet Take 1  tablet (0.5 mg total) by mouth 2 (two) times daily as needed for anxiety. 60 tablet 0  . aspirin 81 MG tablet Take 81 mg by mouth daily.    . bisoprolol-hydrochlorothiazide (ZIAC) 10-6.25 MG tablet Take 1 tablet by mouth daily. 90 tablet 0  . Cholecalciferol ([employer] D3) 2000 units CAPS Take 2,000 Units by mouth daily.     Marland Kitchen DALIRESP 500 MCG TABS tablet Take 500 mcg by mouth daily.    Marland Kitchen gabapentin (NEURONTIN) 300 MG capsule Take 1 capsule (300 mg total) by mouth 4 (four) times daily. 120 capsule 2  . HYDROcodone-acetaminophen (NORCO/VICODIN) 5-325 MG tablet Take 1 tablet by mouth every 8 (eight) hours as needed for severe pain. 90 tablet 0  . HYDROcodone-acetaminophen (NORCO/VICODIN) 5-325 MG tablet Take 1 tablet by mouth every 4 (four) hours as needed for moderate pain. 20 tablet 0  . ibuprofen (ADVIL,MOTRIN) 200 MG tablet Take 1,200 mg by mouth every 6 (six) hours as needed for headache or moderate pain.    Marland Kitchen ipratropium-albuterol (DUONEB)  0.5-2.5 (3) MG/3ML SOLN Take 3 mLs by nebulization every 6 (six) hours as needed (for shortness of breath or wheezing).    Marland Kitchen levothyroxine (SYNTHROID, LEVOTHROID) 25 MCG tablet Take 25 mcg by mouth daily before breakfast. DUPLICATE ENTRY    . OXYGEN Inhale 2.5 L into the lungs continuous. Reported on 11/03/2015    . Polyvinyl Alcohol-Povidone (REFRESH OP) Place 2 drops into both eyes 2 (two) times daily as needed (for dry eyes).    . potassium chloride SA (K-DUR,KLOR-CON) 20 MEQ tablet Take 1 tablet (20 mEq total) by mouth 2 (two) times daily. 180 tablet 3  . ranitidine (ZANTAC) 150 MG tablet Take 150 mg by mouth 2 (two) times daily.    . sertraline (ZOLOFT) 100 MG tablet TAKE 2 TABLETS(200 MG) BY MOUTH EVERY MORNING 180 tablet 0  . tiotropium (SPIRIVA) 18 MCG inhalation capsule Place 18 mcg into inhaler and inhale daily.    . VENTOLIN HFA 108 (90 Base) MCG/ACT inhaler Inhale 2 puffs into the lungs every 6 (six) hours as needed for shortness of breath. 1 Inhaler 5  . torsemide (DEMADEX) 20 MG tablet Take 2 tablets (40 mg total) by mouth 2 (two) times daily. 120 tablet 3   No current facility-administered medications for this visit.     Review of Systems Review of Systems  Constitutional: Negative.   Respiratory: Negative.   Cardiovascular: Negative.     Blood pressure 119/73, pulse 78, temperature (!) 91.2 F (32.9 C), temperature source Temporal, resp. rate 13, height _0  (1.6 m), weight 249 lb 3.2 oz (113 kg), SpO2 96 %.  Physical Exam Physical Exam  Constitutional: She is oriented to person, place, and time. She appears well-developed and well-nourished.  Eyes: Conjunctivae are normal. No scleral icterus.  Abdominal:    Lymphadenopathy:    She has no cervical adenopathy.  Neurological: She is alert and oriented to person, place, and time.  Skin: Skin is warm and dry.       Assessment    Doing well post repair of epigastric and umbilical hernias.    Plan Patient is to  return to the office in 1 month. No driving until you are not taking pain medications for the surgical site.  Patient encouraged to make use of local heat to help resolve residual discomfort. HPI, Physical Exam, Assessment and Plan have been scribed under the direction and in the presence of Hervey Ard, Md.  Eudelia Bunch R. Bobette Mo,  CMA   I have completed the exam and reviewed the above documentation for accuracy and completeness.  I agree with the above.  Haematologist has been used and any errors in dictation or transcription are unintentional.  Hervey Ard, M.D., F.A.C.S.  Forest Gleason  08/22/2018, 6:28 PM

## 2018-08-21 NOTE — Patient Instructions (Addendum)
Patient is to return to the office in 1 month. No driving until you are pain free and no longer taking pain medication.

## 2018-08-22 ENCOUNTER — Encounter: Payer: Self-pay | Admitting: General Surgery

## 2018-08-22 DIAGNOSIS — J9610 Chronic respiratory failure, unspecified whether with hypoxia or hypercapnia: Secondary | ICD-10-CM | POA: Diagnosis not present

## 2018-08-23 DIAGNOSIS — I110 Hypertensive heart disease with heart failure: Secondary | ICD-10-CM | POA: Diagnosis not present

## 2018-08-23 DIAGNOSIS — F329 Major depressive disorder, single episode, unspecified: Secondary | ICD-10-CM | POA: Diagnosis not present

## 2018-08-23 DIAGNOSIS — Z87891 Personal history of nicotine dependence: Secondary | ICD-10-CM | POA: Diagnosis not present

## 2018-08-23 DIAGNOSIS — Z6841 Body Mass Index (BMI) 40.0 and over, adult: Secondary | ICD-10-CM | POA: Diagnosis not present

## 2018-08-23 DIAGNOSIS — I5032 Chronic diastolic (congestive) heart failure: Secondary | ICD-10-CM | POA: Diagnosis not present

## 2018-08-23 DIAGNOSIS — J449 Chronic obstructive pulmonary disease, unspecified: Secondary | ICD-10-CM | POA: Diagnosis not present

## 2018-08-23 DIAGNOSIS — Z9981 Dependence on supplemental oxygen: Secondary | ICD-10-CM | POA: Diagnosis not present

## 2018-08-23 DIAGNOSIS — E669 Obesity, unspecified: Secondary | ICD-10-CM | POA: Diagnosis not present

## 2018-08-23 DIAGNOSIS — Z48815 Encounter for surgical aftercare following surgery on the digestive system: Secondary | ICD-10-CM | POA: Diagnosis not present

## 2018-08-28 DIAGNOSIS — I110 Hypertensive heart disease with heart failure: Secondary | ICD-10-CM | POA: Diagnosis not present

## 2018-08-28 DIAGNOSIS — J449 Chronic obstructive pulmonary disease, unspecified: Secondary | ICD-10-CM | POA: Diagnosis not present

## 2018-08-28 DIAGNOSIS — F329 Major depressive disorder, single episode, unspecified: Secondary | ICD-10-CM | POA: Diagnosis not present

## 2018-08-28 DIAGNOSIS — E669 Obesity, unspecified: Secondary | ICD-10-CM | POA: Diagnosis not present

## 2018-08-28 DIAGNOSIS — Z9981 Dependence on supplemental oxygen: Secondary | ICD-10-CM | POA: Diagnosis not present

## 2018-08-28 DIAGNOSIS — Z48815 Encounter for surgical aftercare following surgery on the digestive system: Secondary | ICD-10-CM | POA: Diagnosis not present

## 2018-08-28 DIAGNOSIS — Z87891 Personal history of nicotine dependence: Secondary | ICD-10-CM | POA: Diagnosis not present

## 2018-08-28 DIAGNOSIS — I5032 Chronic diastolic (congestive) heart failure: Secondary | ICD-10-CM | POA: Diagnosis not present

## 2018-08-28 DIAGNOSIS — Z6841 Body Mass Index (BMI) 40.0 and over, adult: Secondary | ICD-10-CM | POA: Diagnosis not present

## 2018-08-31 ENCOUNTER — Ambulatory Visit
Admission: RE | Admit: 2018-08-31 | Discharge: 2018-08-31 | Disposition: A | Discharge status: Home / Self Care | Payer: Medicare HMO | Source: Ambulatory Visit | Attending: Nurse Practitioner | Admitting: Nurse Practitioner

## 2018-08-31 ENCOUNTER — Other Ambulatory Visit: Payer: Self-pay | Admitting: Family

## 2018-08-31 DIAGNOSIS — M5126 Other intervertebral disc displacement, lumbar region: Secondary | ICD-10-CM | POA: Insufficient documentation

## 2018-08-31 DIAGNOSIS — J9610 Chronic respiratory failure, unspecified whether with hypoxia or hypercapnia: Secondary | ICD-10-CM | POA: Diagnosis not present

## 2018-08-31 DIAGNOSIS — M545 Low back pain: Secondary | ICD-10-CM | POA: Diagnosis not present

## 2018-08-31 DIAGNOSIS — M47816 Spondylosis without myelopathy or radiculopathy, lumbar region: Secondary | ICD-10-CM

## 2018-08-31 DIAGNOSIS — M5127 Other intervertebral disc displacement, lumbosacral region: Secondary | ICD-10-CM | POA: Diagnosis not present

## 2018-08-31 DIAGNOSIS — M48061 Spinal stenosis, lumbar region without neurogenic claudication: Secondary | ICD-10-CM | POA: Insufficient documentation

## 2018-08-31 DIAGNOSIS — M5136 Other intervertebral disc degeneration, lumbar region: Secondary | ICD-10-CM | POA: Diagnosis not present

## 2018-08-31 MED ORDER — POTASSIUM CHLORIDE CRYS ER 20 MEQ PO TBCR: 20.0000 meq | EXTENDED_RELEASE_TABLET | Freq: Two times a day (BID) | ORAL | 3 refills | Status: DC

## 2018-09-03 DIAGNOSIS — J9610 Chronic respiratory failure, unspecified whether with hypoxia or hypercapnia: Secondary | ICD-10-CM | POA: Diagnosis not present

## 2018-09-17 NOTE — Progress Notes (Signed)
Results were reviewed and found to be: abnormal  Surgical consultation may be of benefit  Review would suggest interventional pain management techniques may be of benefit

## 2018-09-20 ENCOUNTER — Encounter: Payer: Self-pay | Admitting: General Surgery

## 2018-09-20 ENCOUNTER — Other Ambulatory Visit: Payer: Self-pay

## 2018-09-20 ENCOUNTER — Ambulatory Visit (INDEPENDENT_AMBULATORY_CARE_PROVIDER_SITE_OTHER): Payer: Medicare HMO | Admitting: General Surgery

## 2018-09-20 VITALS — BP 130/70 | HR 85 | Temp 97.8°F | Resp 20 | Ht 60.0 in | Wt 247.0 lb

## 2018-09-20 DIAGNOSIS — K439 Ventral hernia without obstruction or gangrene: Secondary | ICD-10-CM

## 2018-09-20 NOTE — Patient Instructions (Signed)
Return as needed.The patient is aware to call back for any questions or concerns.

## 2018-09-20 NOTE — Progress Notes (Signed)
Patient ID: Theresa [name], female   DOB: 1956-03-03, 62 y.o.   MRN: [id number]  Chief Complaint  Patient presents with  . Routine Post Op    HPI Theresa [name] is a 62 y.o. female here today for her post op epigastric hernia repair sx date 08/13/18. Patient states she well at this time.  Using supplemental oxygen.  HPI  Past Medical History:  Diagnosis Date  . Acute postoperative pain 10/03/2017  . Anxiety   . BiPAP (biphasic positive airway pressure) dependence    at hs  . Carpal tunnel syndrome   . CHF (congestive heart failure) ([place])    1/18  . CHF (congestive heart failure) ([place])   . Chronic generalized abdominal pain   . Chronic rhinitis   . COPD (chronic obstructive pulmonary disease) ([place])   . DDD (degenerative disc disease), cervical   . DDD (degenerative disc disease), lumbosacral   . Depression   . Dyspnea   . Edema   . Flu    1/18  . Gastritis   . GERD (gastroesophageal reflux disease)   . Hematuria   . Hernia, abdominal   . Hypertension   . Kidney stones   . Low back pain   . Lumbar radiculitis   . Malodorous urine   . Muscle weakness   . Obesity   . Oxygen dependent   . Renal cyst   . Sensory urge incontinence   . Thyroid activity decreased    9/19  . Tobacco abuse   . Wheezing     Past Surgical History:  Procedure Laterality Date  . ABDOMINAL HYSTERECTOMY    . CARPAL TUNNEL RELEASE Left 2012  . EPIGASTRIC HERNIA REPAIR N/A 08/13/2018   HERNIA REPAIR EPIGASTRIC ADULT; polypropylene mesh reinforcement.  Surgeon: Robert Bellow, MD;  Location: [facility] ORS;  Service: General;  Laterality: N/A;  . TONSILLECTOMY    . TUBAL LIGATION    . UMBILICAL HERNIA REPAIR N/A 08/13/2018   HERNIA REPAIR UMBILICAL ADULT; polypropylene mesh reinforcement Surgeon: Robert Bellow, MD;  Location: [facility] OR[facility];  Service: General;  Laterality: N/A;    Family History  Problem Relation Age of Onset  . Heart disease Mother   . Stroke Mother   . Coronary artery  disease Mother   . Lung cancer Sister   . Cancer Sister   . Breast cancer Sister   . Alcohol abuse Father   . Heart disease Father   . Cancer Brother   . Cancer Brother   . Pneumonia Brother   . Prostate cancer Neg Hx   . Kidney cancer Neg Hx   . Bladder Cancer Neg Hx     Social History Social History   Tobacco Use  . Smoking status: Current Every Day Smoker    Packs/day: 1.00    Years: 44.00    Pack years: 44.00    Types: Cigarettes  . Smokeless tobacco: Never Used  Substance Use Topics  . Alcohol use: No    Frequency: Never  . Drug use: No    Allergies  Allergen Reactions  . Codeine Nausea And Vomiting  . Meloxicam Other (See Comments)    Stomach pain    Current Outpatient Medications  Medication Sig Dispense Refill  . ADVAIR DISKUS 250-50 MCG/DOSE AEPB Inhale 1 puff into the lungs 2 (two) times daily.  3  . ALPRAZolam (XANAX) 0.5 MG tablet Take 1 tablet (0.5 mg total) by mouth 2 (two) times daily as needed for anxiety. 60 tablet 0  .  aspirin 81 MG tablet Take 81 mg by mouth daily.    . bisoprolol-hydrochlorothiazide (ZIAC) 10-6.25 MG tablet Take 1 tablet by mouth daily. 90 tablet 0  . Cholecalciferol ([employer] D3) 2000 units CAPS Take 2,000 Units by mouth daily.     Marland Kitchen DALIRESP 500 MCG TABS tablet Take 500 mcg by mouth daily.    Marland Kitchen gabapentin (NEURONTIN) 300 MG capsule Take 1 capsule (300 mg total) by mouth 4 (four) times daily. 120 capsule 2  . HYDROcodone-acetaminophen (NORCO/VICODIN) 5-325 MG tablet Take 1 tablet by mouth every 4 (four) hours as needed for moderate pain. 20 tablet 0  . ibuprofen (ADVIL,MOTRIN) 200 MG tablet Take 1,200 mg by mouth every 6 (six) hours as needed for headache or moderate pain.    Marland Kitchen ipratropium-albuterol (DUONEB) 0.5-2.5 (3) MG/3ML SOLN Take 3 mLs by nebulization every 6 (six) hours as needed (for shortness of breath or wheezing).    Marland Kitchen levothyroxine (SYNTHROID, LEVOTHROID) 25 MCG tablet Take 25 mcg by mouth daily before breakfast. DUPLICATE  ENTRY    . OXYGEN Inhale 2.5 L into the lungs continuous. Reported on 11/03/2015    . Polyvinyl Alcohol-Povidone (REFRESH OP) Place 2 drops into both eyes 2 (two) times daily as needed (for dry eyes).    . potassium chloride SA (K-DUR,KLOR-CON) 20 MEQ tablet Take 1 tablet (20 mEq total) by mouth 2 (two) times daily. 180 tablet 3  . ranitidine (ZANTAC) 150 MG tablet Take 150 mg by mouth 2 (two) times daily.    . sertraline (ZOLOFT) 100 MG tablet TAKE 2 TABLETS(200 MG) BY MOUTH EVERY MORNING 180 tablet 0  . tiotropium (SPIRIVA) 18 MCG inhalation capsule Place 18 mcg into inhaler and inhale daily.    . VENTOLIN HFA 108 (90 Base) MCG/ACT inhaler Inhale 2 puffs into the lungs every 6 (six) hours as needed for shortness of breath. 1 Inhaler 5  . torsemide (DEMADEX) 20 MG tablet Take 2 tablets (40 mg total) by mouth 2 (two) times daily. 120 tablet 3   No current facility-administered medications for this visit.     Review of Systems Review of Systems  Constitutional: Negative.   Respiratory: Negative.   Cardiovascular: Negative.     Blood pressure 130/70, pulse 85, temperature 97.8 F (36.6 C), temperature source Skin, resp. rate 20, height 5' (1.524 m), weight 247 lb (112 kg), SpO2 98 %.  Physical Exam Physical Exam  Abdominal:        Assessment    Doing well post repair of epigastric and umbilical defect with preperitoneal polypropylene mesh placement    Plan  Return as needed. The patient is aware to call back for any questions or concerns.    HPI, Physical Exam, Assessment and Plan have been scribed under the direction and in the presence of Hervey Ard, MD.  Gaspar Cola, CMA  I have completed the exam and reviewed the above documentation for accuracy and completeness.  I agree with the above.  Haematologist has been used and any errors in dictation or transcription are unintentional.  Hervey Ard, M.D., F.A.C.S.  Theresa [name] Theresa [name] 09/20/2018, 1:16  PM

## 2018-09-21 DIAGNOSIS — J9610 Chronic respiratory failure, unspecified whether with hypoxia or hypercapnia: Secondary | ICD-10-CM | POA: Diagnosis not present

## 2018-09-23 NOTE — Progress Notes (Signed)
Patient's Name: Theresa [name]  MRN: [id number]  Referring Provider: Margo Common, [place]  DOB: [birth year]  PCP: Margo Common, PA  DOS: 09/24/2018  Note by: Gaspar Cola, MD  Service setting: Ambulatory outpatient  Specialty: Interventional Pain Management  Location: [facility] (AMB) Pain Management Facility    Patient type: Established   Primary Reason(s) for Visit: Evaluation of chronic illnesses with exacerbation, or progression (Level of risk: moderate) CC: Back Pain  HPI  Ms. Clements is a 62 y.o. year old, female patient, who comes today for a follow-up evaluation. She has Neuritis or radiculitis due to rupture of lumbar intervertebral disc; Decreased motor strength; Essential (primary) hypertension; Clinical depression; DDD (degenerative disc disease), lumbar; CAFL (chronic airflow limitation) ([place]); Chronic rhinitis; Carpal tunnel syndrome; Blood clotting disorder ([place]); Anxiety state; Chronic diastolic heart failure ([place]); Chest pain; Tobacco abuse; Encounter for therapeutic drug level monitoring; Encounter for chronic pain management; Pain management; Neurogenic pain; Neuropathic pain; Musculoskeletal pain; Lumbar spondylosis (L4-5); Chronic low back pain (Primary Area of Pain) (Left); Chronic lower extremity pain (Secondary Area of Pain) (Left); Chronic lumbar radicular pain (L4 & S1 Dermatome) (Left); Chronic shoulder pain (Right side); Chronic upper extremity pain (Right-sided); Cervical radiculitis (Right side); Abnormal x-ray of lumbar spine; Chronic pain syndrome (significant psychosocial component); 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; Coagulation disorder ([place]); 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); Obesity, Class III, BMI 40-49.9 (morbid obesity) ([place]); 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[place]); Vitamin D deficiency; Long term current use of opiate analgesic; Long term prescription opiate use; Opiate use (15 MME/Day); Lumbar facet syndrome  (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); Chronic sacroiliac joint pain (Bilateral); Sleep apnea; Supplemental oxygen dependent; Lumbar facet hypertrophy (multilevel); Lumbar spinal stenosis (L4-5); 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); 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); Lumbosacral radiculopathy at L4; Grief at loss of child; Chronic shoulder pain (Left); Elevated brain natriuretic peptide (BNP) level; Arthralgia of acromioclavicular joint (Right); Acromioclavicular joint DJD (Right); Osteoarthritis of shoulder (Right); COPD with hypoxia ([place]); Other long term (current) drug therapy; Disorder of bone, unspecified; COPD (chronic obstructive pulmonary disease) ([place]); Chronic hip pain (Tertiary Area of Pain) (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); Altered mental status, unspecified; Chronic lower extremity pain (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); Epigastric hernia; Cervicalgia (Bilateral) (R>L); and Spondylosis without myelopathy or radiculopathy, lumbosacral region on their problem list. Ms. Surber was last seen on Visit date not found. Her primarily concern today is the Back Pain  Pain Assessment: Location:   Back Radiating: pain radiaties downboth leg and buttock Onset: More than a month ago Duration: Chronic pain Quality: Aching, Discomfort, Constant Severity: 7 /10 (subjective, self-reported pain score)  Note: Reported level is compatible with observation.                         When using our objective Pain Scale, levels between 6 and 10/10 are said to belong in an emergency room, as it progressively worsens from a 6/10, described as severely limiting, requiring emergency care not usually available at an outpatient pain management facility. At a 6/10 level, communication becomes difficult and requires great effort. Assistance to reach the emergency department may be required. Facial flushing and profuse sweating along with potentially dangerous increases in heart rate and blood pressure will be evident. Effect on ADL: unable to  do household chores, walking is getting worse Timing: Constant Modifying factors: medication and rest BP: (!) 101/59  HR: 70  The patient comes into the clinic today indicating that her lower back symptoms are  worsening.  I have reviewed her prior treatments and it turns out that her last lumbar radiofrequency was done in December of last year, meaning that it has been almost a year and therefore it is likely that they may be worn off.  To test this theory, we will bring the patient back for a diagnostic bilateral lumbar facet block.  If she gets good relief, then it may be time to redo the lumbar facets.  Today we have talked about her weight and how she needs to work on that.  She also indicated having problems with bilateral hand numbness.  She had a carpal tunnel release on the left side.  On the right side she has a positive Tinel sign.  However, she has been having pain going down the arm and in the neck area suggesting the possibility of cervical spine disease.  She denies any previous x-rays of the cervical spine.  Today we will be ordering some.  Further details on both, my assessment(s), as well as the proposed treatment plan, please see below.  Laboratory Chemistry  Inflammation Markers (CRP: Acute Phase) (ESR: Chronic Phase) Lab Results  Component Value Date   CRP 9.1 (H) 08/08/2017   ESRSEDRATE 16 08/08/2017   LATICACIDVEN 0.9 12/10/2017                         Rheumatology Markers No results found.  Renal Function Markers Lab Results  Component Value Date   BUN 19 07/31/2018   CREATININE 0.83 07/31/2018   BCR 17 03/01/2018   GFRAA >60 07/31/2018   GFRNONAA >60 07/31/2018                             Hepatic Function Markers Lab Results  Component Value Date   AST 18 03/01/2018   ALT 20 03/01/2018   ALBUMIN 4.0 03/01/2018   ALKPHOS 107 03/01/2018   LIPASE 31 10/17/2017   AMMONIA 31 12/09/2017                        Electrolytes Lab Results  Component Value  Date   NA 142 07/31/2018   K 3.8 07/31/2018   CL 99 07/31/2018   CALCIUM 9.4 07/31/2018   MG 2.5 (H) 12/10/2017   PHOS 5.4 (H) 12/10/2017                        Neuropathy Markers Lab Results  Component Value Date   VITAMINB12 501 08/08/2017   HIV Non Reactive 12/10/2017                        CNS Tests No results found.  Bone Pathology Markers Lab Results  Component Value Date   25OHVITD1 13 (L) 09/17/2015   25OHVITD2 <1.0 09/17/2015   25OHVITD3 13 09/17/2015                         Coagulation Parameters Lab Results  Component Value Date   INR 1.04 12/09/2017   LABPROT 13.6 12/09/2017   APTT 36 12/09/2017   PLT 153 08/07/2018                        Cardiovascular Markers Lab Results  Component Value Date   BNP 333.4 (H) 12/10/2017   CKTOTAL 72 12/27/2012  CKMB 1.0 12/27/2012   TROPONINI <0.03 02/07/2018   HGB 14.3 08/07/2018   HCT 46.3 (H) 08/07/2018                         CA Markers No results found.  Note: Lab results reviewed.  Imaging Review  Cervical Imaging: Cervical DG complete:  Results for orders placed during the hospital encounter of 03/07/16  DG Cervical Spine Complete   Narrative CLINICAL DATA:  Acute lower neck pain after fall yesterday. Initial encounter.  EXAM: CERVICAL SPINE - COMPLETE 4+ VIEW  COMPARISON:  CT scan of December 31, 2013.  FINDINGS: No fracture or spondylolisthesis is noted. Mild degenerative disc disease is noted at C5-6. Moderate degenerative disc disease is noted at C6-7.  IMPRESSION: Multilevel degenerative disc disease is noted in lower cervical spine. No acute abnormality seen in the cervical spine.   Electronically Signed   By: Marijo Conception, M.D.   On: 03/07/2016 15:26    Shoulder Imaging: Shoulder-R DG:  Results for orders placed during the hospital encounter of 03/07/16  DG Shoulder Right   Narrative CLINICAL DATA:  Fall yesterday, right shoulder  EXAM: RIGHT SHOULDER - 2+  VIEW  COMPARISON:  12/31/2013  FINDINGS: Three views of the right shoulder submitted. No acute fracture or subluxation. Degenerative changes AC joint. Glenohumeral joint is preserved.  IMPRESSION: No acute fracture or subluxation.  Degenerative changes AC joint.   Electronically Signed   By: Lahoma Crocker M.D.   On: 03/07/2016 15:16    Lumbosacral Imaging: Lumbar MR wo contrast:  Results for orders placed during the hospital encounter of 08/31/18  MR LUMBAR SPINE WO CONTRAST   Narrative CLINICAL DATA:  Chronic low back pain with increasing severity in the past 6 months.  EXAM: MRI LUMBAR SPINE WITHOUT CONTRAST  TECHNIQUE: Multiplanar, multisequence MR imaging of the lumbar spine was performed. No intravenous contrast was administered.  COMPARISON:  02/23/2016  FINDINGS: Segmentation:  Standard.  Alignment: Trace anterolisthesis of L3 on L4, stable to minimally increased. Trace retrolisthesis of L5 on S1.  Vertebrae: No fracture or suspicious osseous lesion. Minimal right-sided degenerative endplate edema at U3-J4. Scattered vertebral hemangiomas, largest at T12.  Conus medullaris and cauda equina: Conus extends to the upper L1 level. Conus and cauda equina appear normal.  Paraspinal and other soft tissues: Partially visualized small T2 hyperintense lesions in both kidneys, similar to the prior study and likely reflecting cysts. Partially visualized 5 mm T2 hyperintense lesion in the posterior right hepatic lobe, also possibly a cyst or hemangioma but incompletely evaluated.  Disc levels:  Disc desiccation throughout the lumbar spine. Moderate disc space narrowing at L3-4 and severe narrowing at L4-5.  T12-L1 and L1-2: Negative.  L2-3: Mild disc bulging, a small right foraminal disc extrusion, and slight facet hypertrophy result in minimal right neural foraminal stenosis without significant spinal stenosis, unchanged.  L3-4: Circumferential disc bulging and  mild facet hypertrophy result in borderline to mild right greater than left neural foraminal stenosis, stable to minimally increased. Dorsal epidural lipomatosis mildly narrows the thecal sac.  L4-5: The left foraminal disc extrusion on the prior study has greatly decreased in size though not completely resolved. The disc extrusion, circumferential disc bulging, and endplate spurring result in mild-to-moderate bilateral neural foraminal stenosis without spinal stenosis.  L5-S1: Mild disc bulging, a new right foraminal disc protrusion, and mild facet hypertrophy result in moderate to severe right neural foraminal stenosis with suspected right L5  nerve root impingement. No spinal stenosis.  IMPRESSION: 1. New right foraminal disc protrusion at L5-S1 resulting in severe foraminal stenosis and L5 nerve root impingement. 2. Decreased size of left foraminal disc extrusion at L4-5. 3. Stable to minimally progressive disc degeneration at L3-4 with at most mild neural foraminal stenosis.   Electronically Signed   By: Logan Bores M.D.   On: 08/31/2018 10:48    Hip Imaging: Hip-R DG 2-3 views:  Results for orders placed during the hospital encounter of 12/29/15  DG HIP UNILAT W OR W/O PELVIS 2-3 VIEWS RIGHT   Narrative CLINICAL DATA:  Pt c/o bilateral hip pain with ambulation x2 weeks, NKI, hx arthritis, no prev surg  EXAM: DG HIP (WITH OR WITHOUT PELVIS) 2-3V RIGHT  COMPARISON:  CT 07/21/2014  FINDINGS: There is no evidence of hip fracture or dislocation. There is no evidence of arthropathy or other focal bone abnormality. Pelvic phleboliths. Iliofemoral arterial calcifications.  IMPRESSION: Negative hip.   Electronically Signed   By: Lucrezia Europe M.D.   On: 12/29/2015 13:10    Hip-L DG 2-3 views:  Results for orders placed during the hospital encounter of 12/29/15  DG HIP UNILAT W OR W/O PELVIS 2-3 VIEWS LEFT   Narrative CLINICAL DATA:  Bilateral hip pain on ambulation  for 2 weeks.  EXAM: DG HIP (WITH OR WITHOUT PELVIS) 2-3V LEFT  COMPARISON:  None.  FINDINGS: There is no evidence of hip fracture or dislocation. There are degenerative joint changes of the left hip with narrowed joint space. Degenerative joint changes of the visualized lumbar spine are noted. There pelvic phleboliths.  IMPRESSION: No acute fracture or dislocation. Degenerative joint changes of the left hip.   Electronically Signed   By: Abelardo Diesel M.D.   On: 12/29/2015 13:10    Knee Imaging: Knee-R DG 4 views:  Results for orders placed in visit on 07/10/15  DG Knee Complete 4 Views Right   Narrative CLINICAL DATA:  Right knee pain and swelling without known injury.  EXAM: RIGHT KNEE - COMPLETE 4+ VIEW  COMPARISON:  June 15, 2010.  FINDINGS: There is no evidence of fracture, dislocation, or joint effusion. There is no evidence of arthropathy or other focal bone abnormality. Soft tissues are unremarkable.  IMPRESSION: Normal right knee.   Electronically Signed   By: Marijo Conception, M.D.   On: 07/10/2015 15:23    Wrist Imaging: Wrist-R DG Complete:  Results for orders placed during the hospital encounter of 10/17/13  DG Wrist Complete Right   Narrative CLINICAL DATA:  Fall down stairs. Laceration and bruising along the right wrist.  EXAM: RIGHT WRIST - COMPLETE 3+ VIEW  COMPARISON:  None.  FINDINGS: Degenerative spurring of the 1st carpometacarpal articulation. Scalloping along the ulnar side of the lunate suggesting small erosion.  No fracture is observed. Subcutaneous stranding along the lateral distal forearm may reflect bruising.  IMPRESSION: 1. No acute bony findings. 2. Suspected erosion along the ulnar side of the lunate, query erosive arthropathy. 3. Mild degenerative findings at the 1st carpometacarpal articulation.   Electronically Signed   By: Sherryl Barters M.D.   On: 10/17/2013 21:12    Complexity Note: Imaging  results reviewed. Results shared with Ms. Marchitto, using Layman's terms. Specifically today we went over the results of the patient's lumbar MRI.                  Meds   Current Outpatient Medications:  .  ADVAIR DISKUS 250-50 MCG/DOSE  AEPB, Inhale 1 puff into the lungs 2 (two) times daily., Disp: , Rfl: 3 .  ALPRAZolam (XANAX) 0.5 MG tablet, Take 1 tablet (0.5 mg total) by mouth 2 (two) times daily as needed for anxiety., Disp: 60 tablet, Rfl: 0 .  aspirin 81 MG tablet, Take 81 mg by mouth daily., Disp: , Rfl:  .  bisoprolol-hydrochlorothiazide (ZIAC) 10-6.25 MG tablet, Take 1 tablet by mouth daily., Disp: 90 tablet, Rfl: 0 .  Cholecalciferol ([employer]S D3) 2000 units CAPS, Take 2,000 Units by mouth daily. , Disp: , Rfl:  .  DALIRESP 500 MCG TABS tablet, Take 500 mcg by mouth daily., Disp: , Rfl:  .  gabapentin (NEURONTIN) 300 MG capsule, Take 1 capsule (300 mg total) by mouth 4 (four) times daily., Disp: 120 capsule, Rfl: 2 .  HYDROcodone-acetaminophen (NORCO/VICODIN) 5-325 MG tablet, Take 1 tablet by mouth every 4 (four) hours as needed for moderate pain., Disp: 20 tablet, Rfl: 0 .  ibuprofen (ADVIL,MOTRIN) 200 MG tablet, Take 1,200 mg by mouth every 6 (six) hours as needed for headache or moderate pain., Disp: , Rfl:  .  ipratropium-albuterol (DUONEB) 0.5-2.5 (3) MG/3ML SOLN, Take 3 mLs by nebulization every 6 (six) hours as needed (for shortness of breath or wheezing)., Disp: , Rfl:  .  levothyroxine (SYNTHROID, LEVOTHROID) 25 MCG tablet, Take 25 mcg by mouth daily before breakfast. DUPLICATE ENTRY, Disp: , Rfl:  .  OXYGEN, Inhale 2.5 L into the lungs continuous. Reported on 11/03/2015, Disp: , Rfl:  .  Polyvinyl Alcohol-Povidone (REFRESH OP), Place 2 drops into both eyes 2 (two) times daily as needed (for dry eyes)., Disp: , Rfl:  .  potassium chloride SA (K-DUR,KLOR-CON) 20 MEQ tablet, Take 1 tablet (20 mEq total) by mouth 2 (two) times daily., Disp: 180 tablet, Rfl: 3 .  ranitidine (ZANTAC) 150  MG tablet, Take 150 mg by mouth 2 (two) times daily., Disp: , Rfl:  .  sertraline (ZOLOFT) 100 MG tablet, TAKE 2 TABLETS(200 MG) BY MOUTH EVERY MORNING, Disp: 180 tablet, Rfl: 0 .  tiotropium (SPIRIVA) 18 MCG inhalation capsule, Place 18 mcg into inhaler and inhale daily., Disp: , Rfl:  .  VENTOLIN HFA 108 (90 Base) MCG/ACT inhaler, Inhale 2 puffs into the lungs every 6 (six) hours as needed for shortness of breath., Disp: 1 Inhaler, Rfl: 5 .  torsemide (DEMADEX) 20 MG tablet, Take 2 tablets (40 mg total) by mouth 2 (two) times daily., Disp: 120 tablet, Rfl: 3  ROS  Constitutional: Denies any fever or chills Gastrointestinal: No reported hemesis, hematochezia, vomiting, or acute GI distress Musculoskeletal: Denies any acute onset joint swelling, redness, loss of ROM, or weakness Neurological: No reported episodes of acute onset apraxia, aphasia, dysarthria, agnosia, amnesia, paralysis, loss of coordination, or loss of consciousness  Allergies  Ms. Alverio is allergic to codeine and meloxicam.  PFSH  Drug: Ms. Gildner  reports that she does not use drugs. Alcohol:  reports that she does not drink alcohol. Tobacco:  reports that she has been smoking cigarettes. She has a 44.00 pack-year smoking history. She has never used smokeless tobacco. Medical:  has a past medical history of Acute postoperative pain (10/03/2017), Anxiety, BiPAP (biphasic positive airway pressure) dependence, Carpal tunnel syndrome, CHF (congestive heart failure) ([place]), CHF (congestive heart failure) ([place]), Chronic generalized abdominal pain, Chronic rhinitis, COPD (chronic obstructive pulmonary disease) ([place]), DDD (degenerative disc disease), cervical, DDD (degenerative disc disease), lumbosacral, Depression, Dyspnea, Edema, Flu, Gastritis, GERD (gastroesophageal reflux disease), Hematuria, Hernia of abdominal wall (  07/17/2018), Hernia, abdominal, Hypertension, Kidney stones, Low back pain, Lumbar radiculitis, Malodorous urine,  Muscle weakness, Obesity, Oxygen dependent, Renal cyst, Sensory urge incontinence, Thyroid activity decreased, Tobacco abuse, and Wheezing. Surgical: Ms. Gethers  has a past surgical history that includes Abdominal hysterectomy; Tonsillectomy; Carpal tunnel release (Left, 2012); Tubal ligation; epigastric hernia repair (N/A, 41/28/2081); and Umbilical hernia repair (N/A, 08/13/2018). Family: family history includes Alcohol abuse in her father; Breast cancer in her sister; Cancer in her brother, brother, and sister; Coronary artery disease in her mother; Heart disease in her father and mother; Lung cancer in her sister; Pneumonia in her brother; Stroke in her mother.  Constitutional Exam  General appearance: Well nourished, well developed, and well hydrated. In no apparent acute distress Vitals:   09/24/18 0900  BP: (!) 101/59  Pulse: 70  Temp: 98.4 F (36.9 C)  SpO2: 92%  Weight: 244 lb (110.7 kg)  Height: 5' 3" (1.6 m)   BMI Assessment: Estimated body mass index is 43.22 kg/m as calculated from the following:   Height as of this encounter: 5' 3" (1.6 m).   Weight as of this encounter: 244 lb (110.7 kg).  BMI interpretation table: BMI level Category Range association with higher incidence of chronic pain  <18 kg/m2 Underweight   18.5-24.9 kg/m2 Ideal body weight   25-29.9 kg/m2 Overweight Increased incidence by 20%  30-34.9 kg/m2 Obese (Class I) Increased incidence by 68%  35-39.9 kg/m2 Severe obesity (Class II) Increased incidence by 136%  >40 kg/m2 Extreme obesity (Class III) Increased incidence by 254%   Patient's current BMI Ideal Body weight  Body mass index is 43.22 kg/m. Ideal body weight: 52.4 kg (115 lb 8.3 oz) Adjusted ideal body weight: 75.7 kg (166 lb 14.6 oz)   BMI Readings from Last 4 Encounters:  09/24/18 43.22 kg/m  09/20/18 48.24 kg/m  08/21/18 44.14 kg/m  08/13/18 42.84 kg/m   Wt Readings from Last 4 Encounters:  09/24/18 244 lb (110.7 kg)  09/20/18  247 lb (112 kg)  08/21/18 249 lb 3.2 oz (113 kg)  08/13/18 241 lb 13.5 oz (109.7 kg)  Psych/Mental status: Alert, oriented x 3 (person, place, & time)       Eyes: PERLA Respiratory: Oxygen-dependent COPD  Cervical Spine Area Exam  Skin & Axial Inspection: No masses, redness, edema, swelling, or associated skin lesions Alignment: Symmetrical Functional ROM: Decreased ROM      Stability: No instability detected Muscle Tone/Strength: Functionally intact. No obvious neuro-muscular anomalies detected. Sensory (Neurological): Movement-associated discomfort Palpation: No palpable anomalies              Upper Extremity (UE) Exam    Side: Right upper extremity  Side: Left upper extremity  Skin & Extremity Inspection: Skin color, temperature, and hair growth are WNL. No peripheral edema or cyanosis. No masses, redness, swelling, asymmetry, or associated skin lesions. No contractures.  Skin & Extremity Inspection: Skin color, temperature, and hair growth are WNL. No peripheral edema or cyanosis. No masses, redness, swelling, asymmetry, or associated skin lesions. No contractures.  Functional ROM: Unrestricted ROM          Functional ROM: Unrestricted ROM          Muscle Tone/Strength: Functionally intact. No obvious neuro-muscular anomalies detected.  Muscle Tone/Strength: Functionally intact. No obvious neuro-muscular anomalies detected.  Sensory (Neurological): Unimpaired          Sensory (Neurological): Unimpaired          Palpation: No palpable anomalies  Palpation: No palpable anomalies              Provocative Test(s):  Phalen's test: (+) for CTS Tinel's test: (+) for CTS Apley's scratch test (touch opposite shoulder):  Action 1 (Across chest): deferred Action 2 (Overhead): deferred Action 3 (LB reach): deferred   Provocative Test(s):  Phalen's test: (-) Tinel's test: (-) Apley's scratch test (touch opposite shoulder):  Action 1 (Across chest): deferred Action 2 (Overhead):  deferred Action 3 (LB reach): deferred    Thoracic Spine Area Exam  Skin & Axial Inspection: No masses, redness, or swelling Alignment: Symmetrical Functional ROM: Unrestricted ROM Stability: No instability detected Muscle Tone/Strength: Functionally intact. No obvious neuro-muscular anomalies detected. Sensory (Neurological): Unimpaired Muscle strength & Tone: No palpable anomalies  Lumbar Spine Area Exam  Skin & Axial Inspection: No masses, redness, or swelling Alignment: Symmetrical Functional ROM: Decreased ROM affecting both sides Stability: No instability detected Muscle Tone/Strength: Functionally intact. No obvious neuro-muscular anomalies detected. Sensory (Neurological): Movement-associated pain Palpation: Complains of area being tender to palpation       Provocative Tests: Hyperextension/rotation test: (+) bilaterally for facet joint pain. Lumbar quadrant test (Kemp's test): (+) bilaterally for facet joint pain. Lateral bending test: deferred today       Patrick's Maneuver: deferred today                   FABER* test: deferred today                   S-I anterior distraction/compression test: deferred today         S-I lateral compression test: deferred today         S-I Thigh-thrust test: deferred today         S-I Gaenslen's test: deferred today         *(Flexion, ABduction and External Rotation)  Gait & Posture Assessment  Ambulation: Patient came in today in a wheel chair Gait: Limited. Using assistive device to ambulate Posture: Antalgic   Lower Extremity Exam    Side: Right lower extremity  Side: Left lower extremity  Stability: No instability observed          Stability: No instability observed          Skin & Extremity Inspection: Skin color, temperature, and hair growth are WNL. No peripheral edema or cyanosis. No masses, redness, swelling, asymmetry, or associated skin lesions. No contractures.  Skin & Extremity Inspection: Skin color, temperature, and  hair growth are WNL. No peripheral edema or cyanosis. No masses, redness, swelling, asymmetry, or associated skin lesions. No contractures.  Functional ROM: Unrestricted ROM                  Functional ROM: Unrestricted ROM                  Muscle Tone/Strength: Functionally intact. No obvious neuro-muscular anomalies detected.  Muscle Tone/Strength: Functionally intact. No obvious neuro-muscular anomalies detected.  Sensory (Neurological): Unimpaired        Sensory (Neurological): Unimpaired        DTR: Patellar: deferred today Achilles: deferred today Plantar: deferred today  DTR: Patellar: deferred today Achilles: deferred today Plantar: deferred today  Palpation: No palpable anomalies  Palpation: No palpable anomalies   Assessment  Primary Diagnosis & Pertinent Problem List: The primary encounter diagnosis was Chronic low back pain (Primary Area of Pain) (Left). Diagnoses of Lumbar facet syndrome (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), Cervicalgia (Bilateral) (R>L), Cervical radiculitis (Right side), Spondylosis without  myelopathy or radiculopathy, lumbosacral region, and Lumbar facet hypertrophy (multilevel) were also pertinent to this visit.  Status Diagnosis  Controlled Controlled Controlled 1. Chronic low back pain (Primary Area of Pain) (Left)   2. Lumbar facet syndrome (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)   3. Cervicalgia (Bilateral) (R>L)   4. Cervical radiculitis (Right side)   5. Spondylosis without myelopathy or radiculopathy, lumbosacral region   6. Lumbar facet hypertrophy (multilevel)     Problems updated and reviewed during this visit: Problem  Cervicalgia (Bilateral) (R>L)  Spondylosis Without Myelopathy Or Radiculopathy, Lumbosacral Region   Plan of Care  Pharmacotherapy (Medications Ordered): No orders of the defined types were placed in this encounter.  Medications administered today: [name] had no medications administered during this visit.   Procedure Orders     LUMBAR FACET(MEDIAL  BRANCH NERVE BLOCK) [facility] Lab Orders  No laboratory test(s) ordered today    Imaging Orders     DG Cervical Spine With Flex & Extend Referral Orders  No referral(s) requested today   Interventional management options: Planned, scheduled, and/or pending:   Diagnostic bilateral lumbar facet block #1 after RFA, under fluoroscopic guidance and IV sedation  X-rays of the cervical spine ordered today.   Considering:   Diagnostic bilateral lumbar facet block Possibleleftlumbar facet RFA(right side done on 10/03/2017: Left side done on 06/27/2017) TherapeuticleftL4-5 interlaminar LESI + leftL4 TFESI   Palliative PRN treatment(s):   Palliative left sided lumbar facetblock Palliative left-sided L4-5 interlaminar LESI + left-sided L4TFESI Diagnostic/therapeuticrightL4-5 interlaminar LESI+ rightL4 transforaminal LESI #3 (PRN)   Provider-requested follow-up: Return for Procedure (w/ sedation): (B) L-FCT BLK #1 (s/p RFA).  Future Appointments  Date Time Provider [place]  10/01/2018  1:20 PM [name], FNP [facility] None  10/29/2018 10:30 AM Vevelyn Francois, NP [facility] None  01/21/2019  2:20 PM [facility]-NURSE HEALTH ADVISOR [facility] None  04/15/2019 10:00 AM Harlin Heys, MD [facility] None   Primary Care Physician: Margo Common, PA Location: [facility] Note by: Gaspar Cola, MD Date: 09/24/2018; Time: 9:41 AM

## 2018-09-24 ENCOUNTER — Ambulatory Visit: Payer: Medicare HMO | Attending: Pain Medicine | Admitting: Pain Medicine

## 2018-09-24 ENCOUNTER — Encounter: Payer: Self-pay | Admitting: Pain Medicine

## 2018-09-24 ENCOUNTER — Other Ambulatory Visit: Payer: Self-pay

## 2018-09-24 VITALS — BP 101/59 | HR 70 | Temp 98.4°F | Ht 63.0 in | Wt 244.0 lb

## 2018-09-24 DIAGNOSIS — Z79891 Long term (current) use of opiate analgesic: Secondary | ICD-10-CM | POA: Insufficient documentation

## 2018-09-24 DIAGNOSIS — M542 Cervicalgia: Secondary | ICD-10-CM

## 2018-09-24 DIAGNOSIS — I110 Hypertensive heart disease with heart failure: Secondary | ICD-10-CM | POA: Diagnosis not present

## 2018-09-24 DIAGNOSIS — J449 Chronic obstructive pulmonary disease, unspecified: Secondary | ICD-10-CM | POA: Diagnosis not present

## 2018-09-24 DIAGNOSIS — M48061 Spinal stenosis, lumbar region without neurogenic claudication: Secondary | ICD-10-CM | POA: Diagnosis not present

## 2018-09-24 DIAGNOSIS — M5412 Radiculopathy, cervical region: Secondary | ICD-10-CM | POA: Diagnosis not present

## 2018-09-24 DIAGNOSIS — M5116 Intervertebral disc disorders with radiculopathy, lumbar region: Secondary | ICD-10-CM | POA: Diagnosis not present

## 2018-09-24 DIAGNOSIS — M47816 Spondylosis without myelopathy or radiculopathy, lumbar region: Secondary | ICD-10-CM | POA: Diagnosis not present

## 2018-09-24 DIAGNOSIS — Z791 Long term (current) use of non-steroidal anti-inflammatories (NSAID): Secondary | ICD-10-CM | POA: Insufficient documentation

## 2018-09-24 DIAGNOSIS — M4722 Other spondylosis with radiculopathy, cervical region: Secondary | ICD-10-CM | POA: Insufficient documentation

## 2018-09-24 DIAGNOSIS — F17210 Nicotine dependence, cigarettes, uncomplicated: Secondary | ICD-10-CM | POA: Insufficient documentation

## 2018-09-24 DIAGNOSIS — Z6841 Body Mass Index (BMI) 40.0 and over, adult: Secondary | ICD-10-CM | POA: Insufficient documentation

## 2018-09-24 DIAGNOSIS — K219 Gastro-esophageal reflux disease without esophagitis: Secondary | ICD-10-CM | POA: Diagnosis not present

## 2018-09-24 DIAGNOSIS — E559 Vitamin D deficiency, unspecified: Secondary | ICD-10-CM | POA: Insufficient documentation

## 2018-09-24 DIAGNOSIS — M545 Low back pain: Secondary | ICD-10-CM | POA: Diagnosis not present

## 2018-09-24 DIAGNOSIS — F329 Major depressive disorder, single episode, unspecified: Secondary | ICD-10-CM | POA: Insufficient documentation

## 2018-09-24 DIAGNOSIS — Z885 Allergy status to narcotic agent status: Secondary | ICD-10-CM | POA: Insufficient documentation

## 2018-09-24 DIAGNOSIS — M533 Sacrococcygeal disorders, not elsewhere classified: Secondary | ICD-10-CM | POA: Insufficient documentation

## 2018-09-24 DIAGNOSIS — I5032 Chronic diastolic (congestive) heart failure: Secondary | ICD-10-CM | POA: Diagnosis not present

## 2018-09-24 DIAGNOSIS — G894 Chronic pain syndrome: Secondary | ICD-10-CM | POA: Insufficient documentation

## 2018-09-24 DIAGNOSIS — M5117 Intervertebral disc disorders with radiculopathy, lumbosacral region: Secondary | ICD-10-CM | POA: Diagnosis not present

## 2018-09-24 DIAGNOSIS — G4730 Sleep apnea, unspecified: Secondary | ICD-10-CM | POA: Insufficient documentation

## 2018-09-24 DIAGNOSIS — M4802 Spinal stenosis, cervical region: Secondary | ICD-10-CM | POA: Insufficient documentation

## 2018-09-24 DIAGNOSIS — G8929 Other chronic pain: Secondary | ICD-10-CM | POA: Diagnosis not present

## 2018-09-24 DIAGNOSIS — Z7982 Long term (current) use of aspirin: Secondary | ICD-10-CM | POA: Diagnosis not present

## 2018-09-24 DIAGNOSIS — Z79899 Other long term (current) drug therapy: Secondary | ICD-10-CM | POA: Insufficient documentation

## 2018-09-24 DIAGNOSIS — Z888 Allergy status to other drugs, medicaments and biological substances status: Secondary | ICD-10-CM | POA: Insufficient documentation

## 2018-09-24 DIAGNOSIS — M5010 Cervical disc disorder with radiculopathy, unspecified cervical region: Secondary | ICD-10-CM | POA: Diagnosis not present

## 2018-09-24 DIAGNOSIS — M12811 Other specific arthropathies, not elsewhere classified, right shoulder: Secondary | ICD-10-CM | POA: Insufficient documentation

## 2018-09-24 DIAGNOSIS — M47817 Spondylosis without myelopathy or radiculopathy, lumbosacral region: Secondary | ICD-10-CM

## 2018-09-24 DIAGNOSIS — Z9981 Dependence on supplemental oxygen: Secondary | ICD-10-CM | POA: Insufficient documentation

## 2018-09-24 NOTE — Patient Instructions (Addendum)
__Pre procedure instructions given with sedation.  You have been instructed to get a cervical Xray today in the medical mall.__________________________________________________________________________________________  Preparing for Procedure with Sedation  Instructions: . Oral Intake: Do not eat or drink anything for at least 8 hours prior to your procedure. . Transportation: Public transportation is not allowed. Bring an adult driver. The driver must be physically present in our waiting room before any procedure can be started. Marland Kitchen Physical Assistance: Bring an adult physically capable of assisting you, in the event you need help. This adult should keep you company at home for at least 6 hours after the procedure. . Blood Pressure Medicine: Take your blood pressure medicine with a sip of water the morning of the procedure. . Blood thinners: Notify our staff if you are taking any blood thinners. Depending on which one you take, there will be specific instructions on how and when to stop it. . Diabetics on insulin: Notify the staff so that you can be scheduled 1st case in the morning. If your diabetes requires high dose insulin, take only  of your normal insulin dose the morning of the procedure and notify the staff that you have done so. . Preventing infections: Shower with an antibacterial soap the morning of your procedure. . Build-up your immune system: Take 1000 mg of Vitamin C with every meal (3 times a day) the day prior to your procedure. Marland Kitchen Antibiotics: Inform the staff if you have a condition or reason that requires you to take antibiotics before dental procedures. . Pregnancy: If you are pregnant, call and cancel the procedure. . Sickness: If you have a cold, fever, or any active infections, call and cancel the procedure. . Arrival: You must be in the facility at least 30 minutes prior to your scheduled procedure. . Children: Do not bring children with you. . Dress appropriately: Bring dark  clothing that you would not mind if they get stained. . Valuables: Do not bring any jewelry or valuables.  Procedure appointments are reserved for interventional treatments only. Marland Kitchen No Prescription Refills. . No medication changes will be discussed during procedure appointments. . No disability issues will be discussed.  Reasons to call and reschedule or cancel your procedure: (Following these recommendations will minimize the risk of a serious complication.) . Surgeries: Avoid having procedures within 2 weeks of any surgery. (Avoid for 2 weeks before or after any surgery). . Flu Shots: Avoid having procedures within 2 weeks of a flu shots or . (Avoid for 2 weeks before or after immunizations). . Barium: Avoid having a procedure within 7-10 days after having had a radiological study involving the use of radiological contrast. (Myelograms, Barium swallow or enema study). . Heart attacks: Avoid any elective procedures or surgeries for the initial 6 months after a "Myocardial Infarction" (Heart Attack). . Blood thinners: It is imperative that you stop these medications before procedures. Let us know if you if you take any blood thinner.  . Infection: Avoid procedures during or within two weeks of an infection (including chest colds or gastrointestinal problems). Symptoms associated with infections include: Localized redness, fever, chills, night sweats or profuse sweating, burning sensation when voiding, cough, congestion, stuffiness, runny nose, sore throat, diarrhea, nausea, vomiting, cold or Flu symptoms, recent or current infections. It is specially important if the infection is over the area that we intend to treat. Marland Kitchen Heart and lung problems: Symptoms that may suggest an active cardiopulmonary problem include: cough, chest pain, breathing difficulties or shortness of breath, dizziness,  ankle swelling, uncontrolled high or unusually low blood pressure, and/or palpitations. If you are experiencing any  of these symptoms, cancel your procedure and contact your primary care physician for an evaluation.  Remember:  Regular Business hours are:  Monday to Thursday 8:00 AM to 4:00 PM  Provider's Schedule: Milinda Pointer, MD:  Procedure days: Tuesday and Thursday 7:30 AM to 4:00 PM  Gillis Santa, MD:  Procedure days: Monday and Wednesday 7:30 AM to 4:00 PM ____________________________________________________________________________________________    ______________________________________________________________________________________________  Specialty Pain Scale  Introduction:  There are significant differences in how pain is reported. The word pain usually refers to physical pain, but it is also a common synonym of suffering. The medical community uses a scale from 0 (zero) to 10 (ten) to report pain level. Zero (0) is described as "no pain", while ten (10) is described as "the worse pain you can imagine". The problem with this scale is that physical pain is reported along with suffering. Suffering refers to mental pain, or more often yet it refers to any unpleasant feeling, emotion or aversion associated with the perception of harm or threat of harm. It is the psychological component of pain.  Pain Specialists prefer to separate the two components. The pain scale used by this practice is the Verbal Numerical Rating Scale (VNRS-11). This scale is for the physical pain only. DO NOT INCLUDE how your pain psychologically affects you. This scale is for adults 61 years of age and older. It has 11 (eleven) levels. The 1st level is 0/10. This means: "right now, I have no pain". In the context of pain management, it also means: "right now, my physical pain is under control with the current therapy".  General Information:  The scale should reflect your current level of pain. Unless you are specifically asked for the level of your worst pain, or your average pain. If you are asked for one of these  two, then it should be understood that it is over the past 24 hours.  Levels 1 (one) through 5 (five) are described below, and can be treated as an outpatient. Ambulatory pain management facilities such as ours are more than adequate to treat these levels. Levels 6 (six) through 10 (ten) are also described below, however, these must be treated as a hospitalized patient. While levels 6 (six) and 7 (seven) may be evaluated at an urgent care facility, levels 8 (eight) through 10 (ten) constitute medical emergencies and as such, they belong in a hospital's emergency department. When having these levels (as described below), do not come to our office. Our facility is not equipped to manage these levels. Go directly to an urgent care facility or an emergency department to be evaluated.  Definitions:  Activities of Daily Living (ADL): Activities of daily living (ADL or ADLs) is a term used in healthcare to refer to people's daily self-care activities. Health professionals often use a person's ability or inability to perform ADLs as a measurement of their functional status, particularly in regard to people post injury, with disabilities and the elderly. There are two ADL levels: Basic and Instrumental. Basic Activities of Daily Living (BADL  or BADLs) consist of self-care tasks that include: Bathing and showering; personal hygiene and grooming (including brushing/combing/styling hair); dressing; Toilet hygiene (getting to the toilet, cleaning oneself, and getting back up); eating and self-feeding (not including cooking or chewing and swallowing); functional mobility, often referred to as "transferring", as measured by the ability to walk, get in and out of bed,  and get into and out of a chair; the broader definition (moving from one place to another while performing activities) is useful for people with different physical abilities who are still able to get around independently. Basic ADLs include the things many  people do when they get up in the morning and get ready to go out of the house: get out of bed, go to the toilet, bathe, dress, groom, and eat. On the average, loss of function typically follows a particular order. Hygiene is the first to go, followed by loss of toilet use and locomotion. The last to go is the ability to eat. When there is only one remaining area in which the person is independent, there is a 62.9% chance that it is eating and only a 3.5% chance that it is hygiene. Instrumental Activities of Daily Living (IADL or IADLs) are not necessary for fundamental functioning, but they let an individual live independently in a community. IADL consist of tasks that include: cleaning and maintaining the house; home establishment and maintenance; care of others (including selecting and supervising caregivers); care of pets; child rearing; managing money; managing financials (investments, etc.); meal preparation and cleanup; shopping for groceries and necessities; moving within the community; safety procedures and emergency responses; health management and maintenance (taking prescribed medications); and using the telephone or other form of communication.  Instructions:  Most patients tend to report their pain as a combination of two factors, their physical pain and their psychosocial pain. This last one is also known as "suffering" and it is reflection of how physical pain affects you socially and psychologically. From now on, report them separately.  From this point on, when asked to report your pain level, report only your physical pain. Use the following table for reference.  Pain Clinic Pain Levels (0-5/10)  Pain Level Score  Description  No Pain 0   Mild pain 1 Nagging, annoying, but does not interfere with basic activities of daily living (ADL). Patients are able to eat, bathe, get dressed, toileting (being able to get on and off the toilet and perform personal hygiene functions), transfer (move  in and out of bed or a chair without assistance), and maintain continence (able to control bladder and bowel functions). Blood pressure and heart rate are unaffected. A normal heart rate for a healthy adult ranges from 60 to 100 bpm (beats per minute).   Mild to moderate pain 2 Noticeable and distracting. Impossible to hide from other people. More frequent flare-ups. Still possible to adapt and function close to normal. It can be very annoying and may have occasional stronger flare-ups. With discipline, patients may get used to it and adapt.   Moderate pain 3 Interferes significantly with activities of daily living (ADL). It becomes difficult to feed, bathe, get dressed, get on and off the toilet or to perform personal hygiene functions. Difficult to get in and out of bed or a chair without assistance. Very distracting. With effort, it can be ignored when deeply involved in activities.   Moderately severe pain 4 Impossible to ignore for more than a few minutes. With effort, patients may still be able to manage work or participate in some social activities. Very difficult to concentrate. Signs of autonomic nervous system discharge are evident: dilated pupils (mydriasis); mild sweating (diaphoresis); sleep interference. Heart rate becomes elevated (>115 bpm). Diastolic blood pressure (lower number) rises above 100 mmHg. Patients find relief in laying down and not moving.   Severe pain 5 Intense and extremely unpleasant.  Associated with frowning face and frequent crying. Pain overwhelms the senses.  Ability to do any activity or maintain social relationships becomes significantly limited. Conversation becomes difficult. Pacing back and forth is common, as getting into a comfortable position is nearly impossible. Pain wakes you up from deep sleep. Physical signs will be obvious: pupillary dilation; increased sweating; goosebumps; brisk reflexes; cold, clammy hands and feet; nausea, vomiting or dry heaves; loss  of appetite; significant sleep disturbance with inability to fall asleep or to remain asleep. When persistent, significant weight loss is observed due to the complete loss of appetite and sleep deprivation.  Blood pressure and heart rate becomes significantly elevated. Caution: If elevated blood pressure triggers a pounding headache associated with blurred vision, then the patient should immediately seek attention at an urgent or emergency care unit, as these may be signs of an impending stroke.    Emergency Department Pain Levels (6-10/10)  Emergency Room Pain 6 Severely limiting. Requires emergency care and should not be seen or managed at an outpatient pain management facility. Communication becomes difficult and requires great effort. Assistance to reach the emergency department may be required. Facial flushing and profuse sweating along with potentially dangerous increases in heart rate and blood pressure will be evident.   Distressing pain 7 Self-care is very difficult. Assistance is required to transport, or use restroom. Assistance to reach the emergency department will be required. Tasks requiring coordination, such as bathing and getting dressed become very difficult.   Disabling pain 8 Self-care is no longer possible. At this level, pain is disabling. The individual is unable to do even the most "basic" activities such as walking, eating, bathing, dressing, transferring to a bed, or toileting. Fine motor skills are lost. It is difficult to think clearly.   Incapacitating pain 9 Pain becomes incapacitating. Thought processing is no longer possible. Difficult to remember your own name. Control of movement and coordination are lost.   The worst pain imaginable 10 At this level, most patients pass out from pain. When this level is reached, collapse of the autonomic nervous system occurs, leading to a sudden drop in blood pressure and heart rate. This in turn results in a temporary and dramatic  drop in blood flow to the brain, leading to a loss of consciousness. Fainting is one of the body's self defense mechanisms. Passing out puts the brain in a calmed state and causes it to shut down for a while, in order to begin the healing process.    Summary: 1. Refer to this scale when providing [place] with your pain level. 2. Be accurate and careful when reporting your pain level. This will help with your care. 3. Over-reporting your pain level will lead to loss of credibility. 4. Even a level of 1/10 means that there is pain and will be treated at our facility. 5. High, inaccurate reporting will be documented as "Symptom Exaggeration", leading to loss of credibility and suspicions of possible secondary gains such as obtaining more narcotics, or wanting to appear disabled, for fraudulent reasons. 6. Only pain levels of 5 or below will be seen at our facility. 7. Pain levels of 6 and above will be sent to the Emergency Department and the appointment cancelled. ______________________________________________________________________________________________   Preparing for Procedure with Sedation Instructions: . Oral Intake: Do not eat or drink anything for at least 8 hours prior to your procedure. . Transportation: Public transportation is not allowed. Bring an adult driver. The driver must be physically present in our waiting  room before any procedure can be started. Marland Kitchen Physical Assistance: Bring an adult capable of physically assisting you, in the event you need help. . Blood Pressure Medicine: Take your blood pressure medicine with a sip of water the morning of the procedure. . Insulin: Take only  of your normal insulin dose. . Preventing infections: Shower with an antibacterial soap the morning of your procedure. . Build-up your immune system: Take 1000 mg of Vitamin C with every meal (3 times a day) the day prior to your procedure. . Pregnancy: If you are pregnant, call and cancel the  procedure. . Sickness: If you have a cold, fever, or any active infections, call and cancel the procedure. . Arrival: You must be in the facility at least 30 minutes prior to your scheduled procedure. . Children: Do not bring children with you. . Dress appropriately: Bring dark clothing that you would not mind if they get stained. . Valuables: Do not bring any jewelry or valuables. Procedure appointments are reserved for interventional treatments only. Marland Kitchen No Prescription Refills. . No medication changes will be discussed during procedure appointments. No disability issues will be discussed.Facet Blocks Patient Information  Description: The facets are joints in the spine between the vertebrae.  Like any joints in the body, facets can become irritated and painful.  Arthritis can also effect the facets.  By injecting steroids and local anesthetic in and around these joints, we can temporarily block the nerve supply to them.  Steroids act directly on irritated nerves and tissues to reduce selling and inflammation which often leads to decreased pain.  Facet blocks may be done anywhere along the spine from the neck to the low back depending upon the location of your pain.   After numbing the skin with local anesthetic (like Novocaine), a small needle is passed onto the facet joints under x-ray guidance.  You may experience a sensation of pressure while this is being done.  The entire block usually lasts about 15-25 minutes.   Conditions which may be treated by facet blocks:  Low back/buttock pain Neck/shoulder pain Certain types of headaches  Preparation for the injection:  Do not eat any solid food or dairy products within 8 hours of your appointment. You may drink clear liquid up to 3 hours before appointment.  Clear liquids include water, black coffee, juice or soda.  No milk or cream please. You may take your regular medication, including pain medications, with a sip of water before your  appointment.  Diabetics should hold regular insulin (if taken separately) and take 1/2 normal NPH dose the morning of the procedure.  Carry some sugar containing items with you to your appointment. A driver must accompany you and be prepared to drive you home after your procedure. Bring all your current medications with you. An IV may be inserted and sedation may be given at the discretion of the physician. A blood pressure cuff, EKG and other monitors will often be applied during the procedure.  Some patients may need to have extra oxygen administered for a short period. You will be asked to provide medical information, including your allergies and medications, prior to the procedure.  We must know immediately if you are taking blood thinners (like Coumadin/Warfarin) or if you are allergic to IV iodine contrast (dye).  We must know if you could possible be pregnant.  Possible side-effects:  Bleeding from needle site Infection (rare, may require surgery) Nerve injury (rare) Numbness & tingling (temporary) Difficulty urinating (rare, temporary) Spinal  headache (a headache worse with upright posture) Light-headedness (temporary) Pain at injection site (serveral days) Decreased blood pressure (rare, temporary) Weakness in arm/leg (temporary) Pressure sensation in back/neck (temporary)   Call if you experience:  Fever/chills associated with headache or increased back/neck pain Headache worsened by an upright position New onset, weakness or numbness of an extremity below the injection site Hives or difficulty breathing (go to the emergency room) Inflammation or drainage at the injection site(s) Severe back/neck pain greater than usual New symptoms which are concerning to you  Please note:  Although the local anesthetic injected can often make your back or neck feel good for several hours after the injection, the pain will likely return. It takes 3-7 days for steroids to work.  You may  not notice any pain relief for at least one week.  If effective, we will often do a series of 2-3 injections spaced 3-6 weeks apart to maximally decrease your pain.  After the initial series, you may be a candidate for a more permanent nerve block of the facets.  If you have any questions, please call #[phone] . [facility]

## 2018-09-30 DIAGNOSIS — J9610 Chronic respiratory failure, unspecified whether with hypoxia or hypercapnia: Secondary | ICD-10-CM | POA: Diagnosis not present

## 2018-09-30 NOTE — Progress Notes (Deleted)
Patient ID: Theresa [name], female    DOB: 05-Sep-1956, 62 y.o.   MRN: [id number]  HPI Theresa [name] is a 62 y/o female with a history of chronic low back pain, HTN, depression, COPD on long-term oxygen, anxiety, carpal tunnel syndrome, long-standing tobacco use and chronic heart failure.  Reviewed echo report on 12/11/17 which showed an EF of 55-60% along with trivial AR and an elevated PA pressure of 58 mm Hg. Echo done 11/03/15 but unable to access results. Echo done on 02/19/15 showed an EF of 50% without valvular stenosis. Recent PFT's done on 01/18/16  Has not been admitted or been in the ED in the last 6 months. Did have outpatient ventral hernia repair done 08/13/18.    She presents today for a follow-up visit with a chief complaint of   Past Medical History:  Diagnosis Date  . Acute postoperative pain 10/03/2017  . Anxiety   . BiPAP (biphasic positive airway pressure) dependence    at hs  . Carpal tunnel syndrome   . CHF (congestive heart failure) ([place])    1/18  . CHF (congestive heart failure) ([place])   . Chronic generalized abdominal pain   . Chronic rhinitis   . COPD (chronic obstructive pulmonary disease) ([place])   . DDD (degenerative disc disease), cervical   . DDD (degenerative disc disease), lumbosacral   . Depression   . Dyspnea   . Edema   . Flu    1/18  . Gastritis   . GERD (gastroesophageal reflux disease)   . Hematuria   . Hernia of abdominal wall 07/17/2018  . Hernia, abdominal   . Hypertension   . Kidney stones   . Low back pain   . Lumbar radiculitis   . Malodorous urine   . Muscle weakness   . Obesity   . Oxygen dependent   . Renal cyst   . Sensory urge incontinence   . Thyroid activity decreased    9/19  . Tobacco abuse   . Wheezing    Past Surgical History:  Procedure Laterality Date  . ABDOMINAL HYSTERECTOMY    . CARPAL TUNNEL RELEASE Left 2012  . EPIGASTRIC HERNIA REPAIR N/A 08/13/2018   HERNIA REPAIR EPIGASTRIC ADULT; polypropylene mesh  reinforcement.  Surgeon: Robert Bellow, MD;  Location: [facility] ORS;  Service: General;  Laterality: N/A;  . TONSILLECTOMY    . TUBAL LIGATION    . UMBILICAL HERNIA REPAIR N/A 08/13/2018   HERNIA REPAIR UMBILICAL ADULT; polypropylene mesh reinforcement Surgeon: Robert Bellow, MD;  Location: [facility];  Service: General;  Laterality: N/A;   Family History  Problem Relation Age of Onset  . Heart disease Mother   . Stroke Mother   . Coronary artery disease Mother   . Lung cancer Sister   . Cancer Sister   . Breast cancer Sister   . Alcohol abuse Father   . Heart disease Father   . Cancer Brother   . Cancer Brother   . Pneumonia Brother   . Prostate cancer Neg Hx   . Kidney cancer Neg Hx   . Bladder Cancer Neg Hx    Social History   Tobacco Use  . Smoking status: Current Every Day Smoker    Packs/day: 1.00    Years: 44.00    Pack years: 44.00    Types: Cigarettes  . Smokeless tobacco: Never Used  Substance Use Topics  . Alcohol use: No    Frequency: Never   Allergies  Allergen Reactions  .  Codeine Nausea And Vomiting  . Meloxicam Other (See Comments)    Stomach pain     Review of Systems  Constitutional: Positive for fatigue. Negative for appetite change.  HENT: Negative for congestion, postnasal drip and sore throat.   Eyes: Negative.   Respiratory: Positive for cough, shortness of breath and wheezing. Negative for chest tightness.   Cardiovascular: Negative for chest pain, palpitations and leg swelling.  Gastrointestinal: Positive for abdominal distention. Negative for abdominal pain and nausea.  Endocrine: Negative.   Genitourinary: Negative.   Musculoskeletal: Positive for back pain. Negative for neck pain.  Skin: Negative.   Allergic/Immunologic: Negative.   Neurological: Negative for dizziness and light-headedness.  Hematological: Negative for adenopathy. Bruises/bleeds easily.  Psychiatric/Behavioral: Positive for dysphoric mood and sleep disturbance  (wearing oxygen & bipap at night). Negative for agitation and suicidal ideas. The patient is nervous/anxious.      Physical Exam  Constitutional: She is oriented to person, place, and time. She appears well-developed and well-nourished.  HENT:  Head: Normocephalic and atraumatic.  Neck: Normal range of motion. Neck supple. No JVD present.  Cardiovascular: Normal rate and regular rhythm.  Pulmonary/Chest: Effort normal. She has wheezes in the right lower field and the left lower field. She has no rales.  Abdominal: Soft. She exhibits distension. There is no abdominal tenderness.  Musculoskeletal:        General: No tenderness or edema.  Neurological: She is alert and oriented to person, place, and time.  Skin: Skin is warm and dry.  Psychiatric: Her behavior is normal. Thought content normal. Her mood appears anxious. She does not exhibit a depressed mood.  Nursing note and vitals reviewed.     Assessment & Plan:  1: Chronic heart failure with preserved ejection fraction- - NYHA class III - minimally fluid overloaded today with abdominal distention and weight gain - continues to weigh daily at home. Reminded to call for an overnight weight gain of >2 pounds or a weekly weight gain of >5 pounds - weight  -  - monitor sodium content carefully - saw cardiologist Theresa [name]) 03/29/18  - does not fit criteria for REDS vest reading due to elevated BMI - BNP 12/10/17 was 333.4  2: HTN- - BP - saw PCP (Theresa [name]) 03/01/18 - BMP from 07/31/18 reviewed and showed sodium 142, potassium 3.8, creatinine 0.83 and GFR >60  3: COPD- - wearing bipap nightly - oxygen at 2.5L at bedtime and then as needed during the day - last saw pulmonology Theresa [name]) on 07/04/18 - using nebulizer  4: Tobacco use- - alternates between the lozenges, gum and actual cigarettes - removes herself from the oxygen when smoking - complete cessation discussed for 3 minutes with her  Patient did not bring her  medications nor a list. Each medication was verbally reviewed with the patient and she was encouraged to bring the bottles to every visit to confirm accuracy of list.

## 2018-10-01 ENCOUNTER — Ambulatory Visit: Payer: Self-pay | Admitting: Family

## 2018-10-03 DIAGNOSIS — R0609 Other forms of dyspnea: Secondary | ICD-10-CM | POA: Diagnosis not present

## 2018-10-03 DIAGNOSIS — J9610 Chronic respiratory failure, unspecified whether with hypoxia or hypercapnia: Secondary | ICD-10-CM | POA: Diagnosis not present

## 2018-10-03 DIAGNOSIS — J439 Emphysema, unspecified: Secondary | ICD-10-CM | POA: Diagnosis not present

## 2018-10-03 DIAGNOSIS — R0689 Other abnormalities of breathing: Secondary | ICD-10-CM | POA: Diagnosis not present

## 2018-10-22 DIAGNOSIS — J9610 Chronic respiratory failure, unspecified whether with hypoxia or hypercapnia: Secondary | ICD-10-CM | POA: Diagnosis not present

## 2018-10-23 ENCOUNTER — Ambulatory Visit: Payer: Self-pay | Admitting: Pain Medicine

## 2018-10-23 ENCOUNTER — Ambulatory Visit: Payer: Medicare HMO | Admitting: Pain Medicine

## 2018-10-25 ENCOUNTER — Ambulatory Visit: Payer: Medicare HMO | Attending: Family | Admitting: Family

## 2018-10-25 ENCOUNTER — Encounter: Payer: Self-pay | Admitting: Family

## 2018-10-25 VITALS — BP 104/66 | HR 61 | Resp 18 | Ht 62.0 in | Wt 245.2 lb

## 2018-10-25 DIAGNOSIS — F419 Anxiety disorder, unspecified: Secondary | ICD-10-CM | POA: Diagnosis not present

## 2018-10-25 DIAGNOSIS — Z8249 Family history of ischemic heart disease and other diseases of the circulatory system: Secondary | ICD-10-CM | POA: Insufficient documentation

## 2018-10-25 DIAGNOSIS — F17210 Nicotine dependence, cigarettes, uncomplicated: Secondary | ICD-10-CM | POA: Insufficient documentation

## 2018-10-25 DIAGNOSIS — I110 Hypertensive heart disease with heart failure: Secondary | ICD-10-CM | POA: Diagnosis not present

## 2018-10-25 DIAGNOSIS — Z7951 Long term (current) use of inhaled steroids: Secondary | ICD-10-CM | POA: Diagnosis not present

## 2018-10-25 DIAGNOSIS — E669 Obesity, unspecified: Secondary | ICD-10-CM | POA: Diagnosis not present

## 2018-10-25 DIAGNOSIS — Z6841 Body Mass Index (BMI) 40.0 and over, adult: Secondary | ICD-10-CM | POA: Insufficient documentation

## 2018-10-25 DIAGNOSIS — Z79899 Other long term (current) drug therapy: Secondary | ICD-10-CM | POA: Diagnosis not present

## 2018-10-25 DIAGNOSIS — Z823 Family history of stroke: Secondary | ICD-10-CM | POA: Diagnosis not present

## 2018-10-25 DIAGNOSIS — I5032 Chronic diastolic (congestive) heart failure: Secondary | ICD-10-CM | POA: Insufficient documentation

## 2018-10-25 DIAGNOSIS — J449 Chronic obstructive pulmonary disease, unspecified: Secondary | ICD-10-CM | POA: Diagnosis not present

## 2018-10-25 DIAGNOSIS — Z9981 Dependence on supplemental oxygen: Secondary | ICD-10-CM | POA: Insufficient documentation

## 2018-10-25 DIAGNOSIS — J439 Emphysema, unspecified: Secondary | ICD-10-CM | POA: Diagnosis not present

## 2018-10-25 DIAGNOSIS — Z87442 Personal history of urinary calculi: Secondary | ICD-10-CM | POA: Diagnosis not present

## 2018-10-25 DIAGNOSIS — I10 Essential (primary) hypertension: Secondary | ICD-10-CM | POA: Diagnosis not present

## 2018-10-25 DIAGNOSIS — R600 Localized edema: Secondary | ICD-10-CM | POA: Diagnosis not present

## 2018-10-25 DIAGNOSIS — Z885 Allergy status to narcotic agent status: Secondary | ICD-10-CM | POA: Diagnosis not present

## 2018-10-25 DIAGNOSIS — Z886 Allergy status to analgesic agent status: Secondary | ICD-10-CM | POA: Diagnosis not present

## 2018-10-25 DIAGNOSIS — K219 Gastro-esophageal reflux disease without esophagitis: Secondary | ICD-10-CM | POA: Insufficient documentation

## 2018-10-25 DIAGNOSIS — G8929 Other chronic pain: Secondary | ICD-10-CM | POA: Diagnosis not present

## 2018-10-25 DIAGNOSIS — Z720 Tobacco use: Secondary | ICD-10-CM

## 2018-10-25 DIAGNOSIS — Z7982 Long term (current) use of aspirin: Secondary | ICD-10-CM | POA: Diagnosis not present

## 2018-10-25 DIAGNOSIS — Z79890 Hormone replacement therapy (postmenopausal): Secondary | ICD-10-CM | POA: Insufficient documentation

## 2018-10-25 DIAGNOSIS — I509 Heart failure, unspecified: Secondary | ICD-10-CM | POA: Diagnosis present

## 2018-10-25 DIAGNOSIS — M6281 Muscle weakness (generalized): Secondary | ICD-10-CM | POA: Diagnosis not present

## 2018-10-25 DIAGNOSIS — I2721 Secondary pulmonary arterial hypertension: Secondary | ICD-10-CM | POA: Diagnosis not present

## 2018-10-25 DIAGNOSIS — R609 Edema, unspecified: Secondary | ICD-10-CM | POA: Diagnosis not present

## 2018-10-25 DIAGNOSIS — F329 Major depressive disorder, single episode, unspecified: Secondary | ICD-10-CM | POA: Diagnosis not present

## 2018-10-25 DIAGNOSIS — J410 Simple chronic bronchitis: Secondary | ICD-10-CM

## 2018-10-25 DIAGNOSIS — F411 Generalized anxiety disorder: Secondary | ICD-10-CM | POA: Diagnosis not present

## 2018-10-25 NOTE — Progress Notes (Signed)
Patient ID: Theresa [name], female    DOB: May 14, 1956, 63 y.o.   MRN: [id number]  HPI Theresa [name] is a 63 y/o female with a history of chronic low back pain, HTN, depression, COPD on long-term oxygen, anxiety, carpal tunnel syndrome, long-standing tobacco use and chronic heart failure.  Reviewed echo report on 12/11/17 which showed an EF of 55-60% along with trivial AR and an elevated PA pressure of 58 mm Hg. Echo done 11/03/15 but unable to access results. Echo done on 02/19/15 showed an EF of 50% without valvular stenosis. Recent PFT's done on 01/18/16  Has not been admitted or been in the ED in the last 6 months. Did have outpatient ventral hernia repair done 08/13/18.    She presents today for a follow-up visit with a chief complaint of moderate fatigue upon minimal exertion. She describes this as chronic in nature having been present for several years. She has associated cough, shortness of breath, wheezing, abdominal distention, anxiety, back pain and difficulty sleeping along with this. She denies any palpitations, pedal edema, chest pain, dizziness or weight gain. Says that her chronic back pain seems to be the driving force in her difficulty breathing. She says that when her back hurts, she feels quite short of breath.   Past Medical History:  Diagnosis Date  . Acute postoperative pain 10/03/2017  . Anxiety   . BiPAP (biphasic positive airway pressure) dependence    at hs  . Carpal tunnel syndrome   . CHF (congestive heart failure) ([place])    1/18  . CHF (congestive heart failure) ([place])   . Chronic generalized abdominal pain   . Chronic rhinitis   . COPD (chronic obstructive pulmonary disease) ([place])   . DDD (degenerative disc disease), cervical   . DDD (degenerative disc disease), lumbosacral   . Depression   . Dyspnea   . Edema   . Flu    1/18  . Gastritis   . GERD (gastroesophageal reflux disease)   . Hematuria   . Hernia of abdominal wall 07/17/2018  . Hernia, abdominal   .  Hypertension   . Kidney stones   . Low back pain   . Lumbar radiculitis   . Malodorous urine   . Muscle weakness   . Obesity   . Oxygen dependent   . Renal cyst   . Sensory urge incontinence   . Thyroid activity decreased    9/19  . Tobacco abuse   . Wheezing    Past Surgical History:  Procedure Laterality Date  . ABDOMINAL HYSTERECTOMY    . CARPAL TUNNEL RELEASE Left 2012  . EPIGASTRIC HERNIA REPAIR N/A 08/13/2018   HERNIA REPAIR EPIGASTRIC ADULT; polypropylene mesh reinforcement.  Surgeon: Robert Bellow, MD;  Location: [facility] ORS;  Service: General;  Laterality: N/A;  . TONSILLECTOMY    . TUBAL LIGATION    . UMBILICAL HERNIA REPAIR N/A 08/13/2018   HERNIA REPAIR UMBILICAL ADULT; polypropylene mesh reinforcement Surgeon: Robert Bellow, MD;  Location: [facility];  Service: General;  Laterality: N/A;   Family History  Problem Relation Age of Onset  . Heart disease Mother   . Stroke Mother   . Coronary artery disease Mother   . Lung cancer Sister   . Cancer Sister   . Breast cancer Sister   . Alcohol abuse Father   . Heart disease Father   . Cancer Brother   . Cancer Brother   . Pneumonia Brother   . Prostate cancer Neg Hx   .  Kidney cancer Neg Hx   . Bladder Cancer Neg Hx    Social History   Tobacco Use  . Smoking status: Current Every Day Smoker    Packs/day: 1.00    Years: 44.00    Pack years: 44.00    Types: Cigarettes  . Smokeless tobacco: Never Used  Substance Use Topics  . Alcohol use: No    Frequency: Never   Allergies  Allergen Reactions  . Codeine Nausea And Vomiting  . Meloxicam Other (See Comments)    Stomach pain   Prior to Admission medications   Medication Sig Start Date End Date Taking? Authorizing Provider  ALPRAZolam Duanne Moron) 0.5 MG tablet Take 1 tablet (0.5 mg total) by mouth 2 (two) times daily as needed for anxiety. 12/09/16  Yes Darylene Price A, FNP  aspirin 81 MG tablet Take 81 mg by mouth daily.   Yes [provider]   bisoprolol-hydrochlorothiazide (ZIAC) 10-6.25 MG tablet Take 1 tablet by mouth daily. 05/02/18  Yes Jerrol Banana., MD  Cholecalciferol ([facility] D3) 2000 units CAPS Take 2,000 Units by mouth daily.    Yes [provider]  Fluticasone-Umeclidin-Vilant 100-62.5-25 MCG/INH AEPB Inhale 1 puff into the lungs daily. 10/03/18  Yes [provider]  gabapentin (NEURONTIN) 300 MG capsule Take 1 capsule (300 mg total) by mouth 4 (four) times daily. 07/31/18 10/29/18 Yes Vevelyn Francois, NP  HYDROcodone-acetaminophen (NORCO/VICODIN) 5-325 MG tablet Take 1 tablet by mouth every 4 (four) hours as needed for moderate pain. 08/13/18 08/13/19 Yes Byrnett, Forest Gleason, MD  ibuprofen (ADVIL,MOTRIN) 200 MG tablet Take 1,200 mg by mouth every 6 (six) hours as needed for headache or moderate pain.   Yes [provider]  ipratropium-albuterol (DUONEB) 0.5-2.5 (3) MG/3ML SOLN Take 3 mLs by nebulization every 6 (six) hours as needed (for shortness of breath or wheezing).   Yes [provider]  levothyroxine (SYNTHROID, LEVOTHROID) 25 MCG tablet Take 25 mcg by mouth daily before breakfast. DUPLICATE ENTRY   Yes [provider]  OXYGEN Inhale 2.5 L into the lungs continuous. Reported on 11/03/2015   Yes [provider]  potassium chloride SA (K-DUR,KLOR-CON) 20 MEQ tablet Take 1 tablet (20 mEq total) by mouth 2 (two) times daily. 08/31/18  Yes , Otila Kluver A, FNP  ranitidine (ZANTAC) 150 MG tablet Take 150 mg by mouth 2 (two) times daily.   Yes [provider]  sertraline (ZOLOFT) 100 MG tablet TAKE 2 TABLETS(200 MG) BY MOUTH EVERY MORNING 08/21/18  Yes Chrismon, Vickki Muff, PA  VENTOLIN HFA 108 (90 Base) MCG/ACT inhaler Inhale 2 puffs into the lungs every 6 (six) hours as needed for shortness of breath. 02/10/18  Yes Fritzi Mandes, MD  torsemide (DEMADEX) 20 MG tablet Take 2 tablets (40 mg total) by mouth 2 (two) times daily. 07/09/18 08/08/18  [name], FNP     Review of Systems  Constitutional: Positive for fatigue. Negative for appetite change.  HENT: Negative for congestion, postnasal drip and sore throat.   Eyes: Negative.   Respiratory: Positive for cough, shortness of breath and wheezing. Negative for chest tightness.   Cardiovascular: Negative for chest pain, palpitations and leg swelling.  Gastrointestinal: Positive for abdominal distention (sometimes). Negative for abdominal pain and nausea.  Endocrine: Negative.   Genitourinary: Negative.   Musculoskeletal: Positive for back pain (chronic). Negative for neck pain.  Skin: Negative.   Allergic/Immunologic: Negative.   Neurological: Negative for dizziness and light-headedness.  Hematological: Negative for adenopathy. Bruises/bleeds easily.  Psychiatric/Behavioral: Positive for dysphoric mood and sleep disturbance (wearing oxygen & bipap at night). Negative for agitation and suicidal ideas. The patient is nervous/anxious.    Vitals:   10/25/18 1122  BP: 104/66  Pulse: 61  Resp: 18  SpO2: 94%  Weight: 245 lb 4 oz (111.2 kg)  Height: _0  (1.575 m)   Wt Readings from Last 3 Encounters:  10/25/18 245 lb 4 oz (111.2 kg)  09/24/18 244 lb (110.7 kg)  09/20/18 247 lb (112 kg)   Lab Results  Component Value Date   CREATININE 0.83 07/31/2018   CREATININE 0.70 03/01/2018   CREATININE 0.59 02/08/2018    Physical Exam  Constitutional: She is oriented to person, place, and time. She appears well-developed and well-nourished.  HENT:  Head: Normocephalic and atraumatic.  Neck: Normal range of motion. Neck supple. No JVD present.  Cardiovascular: Normal rate and regular rhythm.  Pulmonary/Chest: Effort normal. She has wheezes in the right lower field and the left lower field. She has no rales.  Abdominal: Soft. She exhibits no distension. There is no abdominal tenderness.  Musculoskeletal:        General: No tenderness or edema.  Neurological: She is alert and oriented to person,  place, and time.  Skin: Skin is warm and dry.  Psychiatric: Her behavior is normal. Thought content normal. Her mood appears anxious. She does not exhibit a depressed mood.  Nursing note and vitals reviewed.     Assessment & Plan:  1: Chronic heart failure with preserved ejection fraction- - NYHA class III - euvolemic today - continues to weigh daily at home. Reminded to call for an overnight weight gain of >2 pounds or a weekly weight gain of >5 pounds - weight unchanged from last visit here 4 months ago - not adding salt and is trying to eat low sodium foods - saw cardiologist Clayborn Bigness) 10/25/2018 - does not fit criteria for REDS vest reading due to elevated BMI - BNP 12/10/17 was 333.4 - reports receiving her flu vaccine for this season  2: HTN- - BP looks good today - saw PCP (Chrismon) 03/01/18 - BMP from 07/31/18 reviewed and showed sodium 142, potassium 3.8, creatinine 0.83 and GFR >60  3: COPD- - wearing bipap nightly - oxygen at 2.5L at bedtime and then as needed during the day - last saw pulmonology Raul Del) on 10/03/18 - using nebulizer  4: Tobacco use- - smoking 1 ppd cigarettes - removes herself from the oxygen when smoking - complete cessation discussed for 3 minutes with her  Patient did not bring her medications nor a list. Each medication was verbally reviewed with the patient and she was encouraged to bring the bottles to every visit to confirm accuracy of list.  Return in 6 months or sooner for any questions/problems before then.

## 2018-10-25 NOTE — Patient Instructions (Signed)
Continue weighing daily and call for an overnight weight gain of > 2 pounds or a weekly weight gain of >5 pounds. 

## 2018-10-29 ENCOUNTER — Encounter: Payer: Self-pay | Admitting: Nurse Practitioner

## 2018-10-29 ENCOUNTER — Other Ambulatory Visit: Payer: Self-pay

## 2018-10-29 ENCOUNTER — Ambulatory Visit: Payer: Medicare HMO | Attending: Nurse Practitioner | Admitting: Nurse Practitioner

## 2018-10-29 VITALS — BP 108/64 | HR 66 | Temp 97.7°F | Ht 62.0 in | Wt 244.0 lb

## 2018-10-29 DIAGNOSIS — M48062 Spinal stenosis, lumbar region with neurogenic claudication: Secondary | ICD-10-CM

## 2018-10-29 DIAGNOSIS — M533 Sacrococcygeal disorders, not elsewhere classified: Secondary | ICD-10-CM | POA: Diagnosis not present

## 2018-10-29 DIAGNOSIS — G894 Chronic pain syndrome: Secondary | ICD-10-CM

## 2018-10-29 DIAGNOSIS — Z79891 Long term (current) use of opiate analgesic: Secondary | ICD-10-CM

## 2018-10-29 DIAGNOSIS — M47816 Spondylosis without myelopathy or radiculopathy, lumbar region: Secondary | ICD-10-CM

## 2018-10-29 DIAGNOSIS — G8929 Other chronic pain: Secondary | ICD-10-CM | POA: Diagnosis not present

## 2018-10-29 MED ORDER — HYDROCODONE-ACETAMINOPHEN 5-325 MG PO TABS: 1.0000 | ORAL_TABLET | Freq: Three times a day (TID) | ORAL | 0 refills | Status: DC | PRN

## 2018-10-29 MED ORDER — GABAPENTIN 300 MG PO CAPS: 300.0000 mg | ORAL_CAPSULE | Freq: Four times a day (QID) | ORAL | 2 refills | Status: DC

## 2018-10-29 MED ORDER — GABAPENTIN 300 MG PO CAPS: 300.0000 mg | ORAL_CAPSULE | Freq: Four times a day (QID) | ORAL | 3 refills | Status: DC

## 2018-10-29 NOTE — Patient Instructions (Signed)
____________________________________________________________________________________________  Medication Rules  Purpose: To inform patients, and their family members, of our rules and regulations.  Applies to: All patients receiving prescriptions (written or electronic).  Pharmacy of record: Pharmacy where electronic prescriptions will be sent. If written prescriptions are taken to a different pharmacy, please inform the nursing staff. The pharmacy listed in the electronic medical record should be the one where you would like electronic prescriptions to be sent.  Electronic prescriptions: In compliance with the [place] (STOP) Act of 2017 (Session Lanny Cramp [phone]), effective October 17, 2018, all controlled substances must be electronically prescribed. Calling prescriptions to the pharmacy will cease to exist.  Prescription refills: Only during scheduled appointments. Applies to all prescriptions.  NOTE: The following applies primarily to controlled substances (Opioid* Pain Medications).   Patient's responsibilities: 1. Pain Pills: Bring all pain pills to every appointment (except for procedure appointments). 2. Pill Bottles: Bring pills in original pharmacy bottle. Always bring the newest bottle. Bring bottle, even if empty. 3. Medication refills: You are responsible for knowing and keeping track of what medications you take and those you need refilled. The day before your appointment: write a list of all prescriptions that need to be refilled. The day of the appointment: give the list to the admitting nurse. Prescriptions will be written only during appointments. If you forget a medication: it will not be "Called in", "Faxed", or "electronically sent". You will need to get another appointment to get these prescribed. No early refills. Do not call asking to have your prescription filled early. 4. Prescription Accuracy: You are responsible for  carefully inspecting your prescriptions before leaving our office. Have the discharge nurse carefully go over each prescription with you, before taking them home. Make sure that your name is accurately spelled, that your address is correct. Check the name and dose of your medication to make sure it is accurate. Check the number of pills, and the written instructions to make sure they are clear and accurate. Make sure that you are given enough medication to last until your next medication refill appointment. 5. Taking Medication: Take medication as prescribed. When it comes to controlled substances, taking less pills or less frequently than prescribed is permitted and encouraged. Never take more pills than instructed. Never take medication more frequently than prescribed.  6. Inform other Doctors: Always inform, all of your healthcare providers, of all the medications you take. 7. Pain Medication from other Providers: You are not allowed to accept any additional pain medication from any other Doctor or Healthcare provider. There are two exceptions to this rule. (see below) In the event that you require additional pain medication, you are responsible for notifying us, as stated below. 8. Medication Agreement: You are responsible for carefully reading and following our Medication Agreement. This must be signed before receiving any prescriptions from our practice. Safely store a copy of your signed Agreement. Violations to the Agreement will result in no further prescriptions. (Additional copies of our Medication Agreement are available upon request.) 9. Laws, Rules, & Regulations: All patients are expected to follow all Federal and [employer], [employer], Rules, [employer]. Ignorance of the Laws does not constitute a valid excuse. The use of any illegal substances is prohibited. 10. Adopted CDC guidelines & recommendations: Target dosing levels will be at or below 60 MME/day. Use of benzodiazepines** is not  recommended.  Exceptions: There are only two exceptions to the rule of not receiving pain medications from other Healthcare Providers. 1.  Exception #1 (Emergencies): In the event of an emergency (i.e.: accident requiring emergency care), you are allowed to receive additional pain medication. However, you are responsible for: As soon as you are able, call our office [phone], at any time of the day or night, and leave a message stating your name, the date and nature of the emergency, and the name and dose of the medication prescribed. In the event that your call is answered by a member of our staff, make sure to document and save the date, time, and the name of the person that took your information.  2. Exception #2 (Planned Surgery): In the event that you are scheduled by another doctor or dentist to have any type of surgery or procedure, you are allowed (for a period no longer than 30 days), to receive additional pain medication, for the acute post-op pain. However, in this case, you are responsible for picking up a copy of our "Post-op Pain Management for Surgeons" handout, and giving it to your surgeon or dentist. This document is available at our office, and does not require an appointment to obtain it. Simply go to our office during business hours (Monday-Thursday from 8:00 AM to 4:00 PM) (Friday 8:00 AM to 12:00 Noon) or if you have a scheduled appointment with [place], prior to your surgery, and ask for it by name. In addition, you will need to provide [place] with your name, name of your surgeon, type of surgery, and date of procedure or surgery.  *Opioid medications include: morphine, codeine, oxycodone, oxymorphone, hydrocodone, hydromorphone, meperidine, tramadol, tapentadol, buprenorphine, fentanyl, methadone. **Benzodiazepine medications include: diazepam (Valium), alprazolam (Xanax), clonazepam (Klonopine), lorazepam (Ativan), clorazepate (Tranxene), chlordiazepoxide (Librium), estazolam (Prosom),  oxazepam (Serax), temazepam (Restoril), triazolam (Halcion) (Last updated: 12/14/2017) ____________________________________________________________________________________________    BMI Assessment: Estimated body mass index is 44.63 kg/m as calculated from the following:   Height as of this encounter: 5' 2" (1.575 m).   Weight as of this encounter: 244 lb (110.7 kg).  BMI interpretation table: BMI level Category Range association with higher incidence of chronic pain  <18 kg/m2 Underweight   18.5-24.9 kg/m2 Ideal body weight   25-29.9 kg/m2 Overweight Increased incidence by 20%  30-34.9 kg/m2 Obese (Class I) Increased incidence by 68%  35-39.9 kg/m2 Severe obesity (Class II) Increased incidence by 136%  >40 kg/m2 Extreme obesity (Class III) Increased incidence by 254%   Patient's current BMI Ideal Body weight  Body mass index is 44.63 kg/m. Ideal body weight: 50.1 kg (110 lb 7.2 oz) Adjusted ideal body weight: 74.3 kg (163 lb 13.9 oz)   BMI Readings from Last 4 Encounters:  10/29/18 44.63 kg/m  10/25/18 44.86 kg/m  09/24/18 43.22 kg/m  09/20/18 48.24 kg/m   Wt Readings from Last 4 Encounters:  10/29/18 244 lb (110.7 kg)  10/25/18 245 lb 4 oz (111.2 kg)  09/24/18 244 lb (110.7 kg)  09/20/18 247 lb (112 kg)

## 2018-10-29 NOTE — Progress Notes (Signed)
Nursing Pain Medication Assessment:  Safety precautions to be maintained throughout the outpatient stay will include: orient to surroundings, keep bed in low position, maintain call bell within reach at all times, provide assistance with transfer out of bed and ambulation.  Medication Inspection Compliance: Pill count conducted under aseptic conditions, in front of the patient. Neither the pills nor the bottle was removed from the patient's sight at any time. Once count was completed pills were immediately returned to the patient in their original bottle.  Medication: Hydrocodone/APAP  Pill/Patch Count: 39 of 90 pills remain Pill/Patch Appearance: Markings consistent with prescribed medication Bottle Appearance: Standard pharmacy container. Clearly labeled. Filled Date: 1 / 4 / 2020 Last Medication intake:  Today

## 2018-10-29 NOTE — Progress Notes (Signed)
Patient's Name: Theresa [name]  MRN: [id number]  Referring Provider: Margo Common, [place]  DOB: 09-29-1956  PCP: Margo Common, PA  DOS: 10/29/2018  Note by: Vevelyn Francois NP  Service setting: Ambulatory outpatient  Specialty: Interventional Pain Management  Location: [facility] (AMB) Pain Management Facility    Patient type: Established    Primary Reason(s) for Visit: Encounter for prescription drug management. (Level of risk: moderate)  CC: Back Pain  HPI  Theresa [name] is a 63 y.o. year old, female patient, who comes today for a medication management evaluation. She has Neuritis or radiculitis due to rupture of lumbar intervertebral disc; Decreased motor strength; Essential (primary) hypertension; Clinical depression; DDD (degenerative disc disease), lumbar; CAFL (chronic airflow limitation) ([place]); Chronic rhinitis; Carpal tunnel syndrome; Blood clotting disorder ([place]); Anxiety state; Chronic diastolic heart failure ([place]); Chest pain; Tobacco abuse; Encounter for therapeutic drug level monitoring; Encounter for chronic pain management; Pain management; Neurogenic pain; Neuropathic pain; Musculoskeletal pain; Lumbar spondylosis (L4-5); Chronic low back pain (Primary Area of Pain) (Left); Chronic lower extremity pain (Secondary Area of Pain) (Left); Chronic lumbar radicular pain (L4 & S1 Dermatome) (Left); Chronic shoulder pain (Right side); Chronic upper extremity pain (Right-sided); Cervical radiculitis (Right side); Abnormal x-ray of lumbar spine; Chronic pain syndrome (significant psychosocial component); 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; Coagulation disorder ([place]); 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); Obesity, Class III, BMI 40-49.9 (morbid obesity) ([place]); 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[place]); Vitamin D deficiency; Long term current use of opiate analgesic; Long term prescription opiate use; Opiate use (15 MME/Day); Lumbar facet syndrome (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);  Chronic sacroiliac joint pain (Bilateral); Sleep apnea; Supplemental oxygen dependent; Lumbar facet hypertrophy (multilevel); Lumbar spinal stenosis (L4-5); 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); 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); Lumbosacral radiculopathy at L4; Grief at loss of child; Chronic shoulder pain (Left); Elevated brain natriuretic peptide (BNP) level; Arthralgia of acromioclavicular joint (Right); Acromioclavicular joint DJD (Right); Osteoarthritis of shoulder (Right); COPD with hypoxia ([place]); Other long term (current) drug therapy; Disorder of bone, unspecified; COPD (chronic obstructive pulmonary disease) ([place]); Chronic hip pain (Tertiary Area of Pain) (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); Altered mental status, unspecified; Chronic lower extremity pain (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); Epigastric hernia; Cervicalgia (Bilateral) (R>L); and Spondylosis without myelopathy or radiculopathy, lumbosacral region on their problem list. Her primarily concern today is the Back Pain  Pain Assessment: Location: Left, Right Back Radiating: pain radiaties down both legs Onset: More than a month ago Duration: Chronic pain Quality: Aching, Dull, Discomfort, Numbness, Constant Severity: 7 /10 (subjective, self-reported pain score)  Note: Reported level is compatible with observation. Clinically the patient looks like a 2/10 A 2/10 is viewed as "Mild to Moderate" and described as noticeable and distracting. Impossible to hide from other people. More frequent flare-ups. Still possible to adapt and function close to normal. It can be very annoying and may have occasional stronger flare-ups. With discipline, patients may get used to it and adapt. Discrepancy may suggest a "suffering" (psychosocial) component. When using our objective Pain Scale, levels between 6 and 10/10 are said to belong in an emergency room, as it progressively worsens from a 6/10, described as severely limiting, requiring emergency care not  usually available at an outpatient pain management facility. At a 6/10 level, communication becomes difficult and requires great effort. Assistance to reach the emergency department may be required. Facial flushing and profuse sweating along with potentially dangerous increases in heart rate and blood pressure will be evident. Effect on ADL: unable to do  household due to the pain, pain get worse while walking or standing Timing: Constant Modifying factors: medications and rest BP: 108/64  HR: 66  Theresa [name] was last scheduled for an appointment on 07/31/2018 for medication management. During today's appointment we reviewed Theresa [name] chronic pain status, as well as her outpatient medication regimen. She has occasional pain into the bottom of the feet into her toes. She has some numbness and tingling. She has weakness.   The patient  reports no history of drug use. Her body mass index is 44.63 kg/m.  Further details on both, my assessment(s), as well as the proposed treatment plan, please see below.  Controlled Substance Pharmacotherapy Assessment REMS (Risk Evaluation and Mitigation Strategy)  Analgesic:Hydrocodone/APAP 5/325 one tablet every 8 hours (15 mg/day of hydrocodone) MME/day:15 mg/day Chauncey Fischer, RN  10/29/2018 10:52 AM  Sign when Signing Visit Nursing Pain Medication Assessment:  Safety precautions to be maintained throughout the outpatient stay will include: orient to surroundings, keep bed in low position, maintain call bell within reach at all times, provide assistance with transfer out of bed and ambulation.  Medication Inspection Compliance: Pill count conducted under aseptic conditions, in front of the patient. Neither the pills nor the bottle was removed from the patient's sight at any time. Once count was completed pills were immediately returned to the patient in their original bottle.  Medication: Hydrocodone/APAP  Pill/Patch Count: 39 of 90 pills  remain Pill/Patch Appearance: Markings consistent with prescribed medication Bottle Appearance: Standard pharmacy container. Clearly labeled. Filled Date: 1 / 4 / 2020 Last Medication intake:  Today   Pharmacokinetics: Liberation and absorption (onset of action): WNL Distribution (time to peak effect): WNL Metabolism and excretion (duration of action): WNL         Pharmacodynamics: Desired effects: Analgesia: Ms. Vinje reports >50% benefit. Functional ability: Patient reports that medication allows her to accomplish basic ADLs Clinically meaningful improvement in function (CMIF): Sustained CMIF goals met Perceived effectiveness: Described as relatively effective, allowing for increase in activities of daily living (ADL) Undesirable effects: Side-effects or Adverse reactions: None reported Monitoring: [place] PMP: Online review of the past 108-monthperiod conducted. Compliant with practice rules and regulations Last UDS on record: Summary  Date Value Ref Range Status  07/31/2018 FINAL  Final    Comment:    ==================================================================== TOXASSURE SELECT 13 (MW) ==================================================================== Test                             Result       Flag       Units Drug Present and Declared for Prescription Verification   Hydrocodone                    688          EXPECTED   ng/mg creat   Norhydrocodone                 552          EXPECTED   ng/mg creat    Sources of hydrocodone include scheduled prescription    medications. Norhydrocodone is an expected metabolite of    hydrocodone. Drug Absent but Declared for Prescription Verification   Alprazolam                     Not Detected UNEXPECTED ng/mg creat ==================================================================== Test  Result    Flag   Units      Ref Range   Creatinine              25               mg/dL       >=20 ==================================================================== Declared Medications:  The flagging and interpretation on this report are based on the  following declared medications.  Unexpected results may arise from  inaccuracies in the declared medications.  **Note: The testing scope of this panel includes these medications:  Alprazolam  Hydrocodone (Hydrocodone-Acetaminophen)  **Note: The testing scope of this panel does not include following  reported medications:  Acetaminophen (Hydrocodone-Acetaminophen)  Albuterol  Albuterol (Ipratropium-Albuterol)  Aspirin (Aspirin 81)  Bisoprolol (Ziac)  Fluticasone (Advair)  Gabapentin  Hydrochlorothiazide (Ziac)  Ipratropium (Ipratropium-Albuterol)  Levothyroxine  Oxygen  Potassium (K-Dur)  Ranitidine  Roflumilast  Salmeterol (Advair)  Sertraline  Torsemide ==================================================================== For clinical consultation, please call [phone]. ====================================================================    UDS interpretation: Compliant          Medication Assessment Form: Reviewed. Patient indicates being compliant with therapy Treatment compliance: Compliant Risk Assessment Profile: Aberrant behavior: See prior evaluations. None observed or detected today Comorbid factors increasing risk of overdose: See prior notes. No additional risks detected today Opioid risk tool (ORT) (Total Score): 4 Personal History of Substance Abuse (SUD-Substance use disorder):  Alcohol: Negative  Illegal Drugs: Negative  Rx Drugs: Negative  ORT Risk Level calculation: Moderate Risk Risk of substance use disorder (SUD): Moderate Opioid Risk Tool - 10/29/18 1105      Family History of Substance Abuse   Alcohol  Positive Female    Illegal Drugs  Negative    Rx Drugs  Negative      Personal History of Substance Abuse   Alcohol  Negative    Illegal Drugs  Negative    Rx Drugs  Negative       Age   Age between 1-45 years   No      History of Preadolescent Sexual Abuse   History of Preadolescent Sexual Abuse  Negative or Female      Psychological Disease   Psychological Disease  Negative    Depression  Positive      Total Score   Opioid Risk Tool Scoring  4    Opioid Risk Interpretation  Moderate Risk      ORT Scoring interpretation table:  Score <3 = Low Risk for SUD  Score between 4-7 = Moderate Risk for SUD  Score >8 = High Risk for Opioid Abuse   Risk Mitigation Strategies:  Patient Counseling: Covered Patient-Prescriber Agreement (PPA): Present and active  Notification to other healthcare providers: Done  Pharmacologic Plan: No change in therapy, at this time.             Laboratory Chemistry  Inflammation Markers (CRP: Acute Phase) (ESR: Chronic Phase) Lab Results  Component Value Date   CRP 9.1 (H) 08/08/2017   ESRSEDRATE 16 08/08/2017   LATICACIDVEN 0.9 12/10/2017                         Rheumatology Markers No results found for: RF, ANA, LABURIC, URICUR, LYMEIGGIGMAB, LYMEABIGMQN, HLAB27                      Renal Function Markers Lab Results  Component Value Date   BUN 19 07/31/2018   CREATININE 0.83 07/31/2018  BCR 17 03/01/2018   GFRAA >60 07/31/2018   GFRNONAA >60 07/31/2018                             Hepatic Function Markers Lab Results  Component Value Date   AST 18 03/01/2018   ALT 20 03/01/2018   ALBUMIN 4.0 03/01/2018   ALKPHOS 107 03/01/2018   LIPASE 31 10/17/2017   AMMONIA 31 12/09/2017                        Electrolytes Lab Results  Component Value Date   NA 142 07/31/2018   K 3.8 07/31/2018   CL 99 07/31/2018   CALCIUM 9.4 07/31/2018   MG 2.5 (H) 12/10/2017   PHOS 5.4 (H) 12/10/2017                        Neuropathy Markers Lab Results  Component Value Date   VITAMINB12 501 08/08/2017   HIV Non Reactive 12/10/2017                        CNS Tests No results found for: COLORCSF, APPEARCSF,  RBCCOUNTCSF, WBCCSF, POLYSCSF, LYMPHSCSF, EOSCSF, PROTEINCSF, GLUCCSF, JCVIRUS, CSFOLI, IGGCSF                      Bone Pathology Markers Lab Results  Component Value Date   25OHVITD1 13 (L) 09/17/2015   25OHVITD2 <1.0 09/17/2015   25OHVITD3 13 09/17/2015                         Coagulation Parameters Lab Results  Component Value Date   INR 1.04 12/09/2017   LABPROT 13.6 12/09/2017   APTT 36 12/09/2017   PLT 153 08/07/2018                        Cardiovascular Markers Lab Results  Component Value Date   BNP 333.4 (H) 12/10/2017   CKTOTAL 72 12/27/2012   CKMB 1.0 12/27/2012   TROPONINI <0.03 02/07/2018   HGB 14.3 08/07/2018   HCT 46.3 (H) 08/07/2018                         CA Markers No results found for: CEA, CA125, LABCA2                      Note: Lab results reviewed.  Recent Diagnostic Imaging Results  MR LUMBAR SPINE WO CONTRAST CLINICAL DATA:  Chronic low back pain with increasing severity in the past 6 months.  EXAM: MRI LUMBAR SPINE WITHOUT CONTRAST  TECHNIQUE: Multiplanar, multisequence MR imaging of the lumbar spine was performed. No intravenous contrast was administered.  COMPARISON:  02/23/2016  FINDINGS: Segmentation:  Standard.  Alignment: Trace anterolisthesis of L3 on L4, stable to minimally increased. Trace retrolisthesis of L5 on S1.  Vertebrae: No fracture or suspicious osseous lesion. Minimal right-sided degenerative endplate edema at Q6-S3. Scattered vertebral hemangiomas, largest at T12.  Conus medullaris and cauda equina: Conus extends to the upper L1 level. Conus and cauda equina appear normal.  Paraspinal and other soft tissues: Partially visualized small T2 hyperintense lesions in both kidneys, similar to the prior study and likely reflecting cysts. Partially visualized 5 mm T2 hyperintense lesion in the posterior right hepatic lobe, also  possibly a cyst or hemangioma but incompletely evaluated.  Disc levels:  Disc  desiccation throughout the lumbar spine. Moderate disc space narrowing at L3-4 and severe narrowing at L4-5.  T12-L1 and L1-2: Negative.  L2-3: Mild disc bulging, a small right foraminal disc extrusion, and slight facet hypertrophy result in minimal right neural foraminal stenosis without significant spinal stenosis, unchanged.  L3-4: Circumferential disc bulging and mild facet hypertrophy result in borderline to mild right greater than left neural foraminal stenosis, stable to minimally increased. Dorsal epidural lipomatosis mildly narrows the thecal sac.  L4-5: The left foraminal disc extrusion on the prior study has greatly decreased in size though not completely resolved. The disc extrusion, circumferential disc bulging, and endplate spurring result in mild-to-moderate bilateral neural foraminal stenosis without spinal stenosis.  L5-S1: Mild disc bulging, a new right foraminal disc protrusion, and mild facet hypertrophy result in moderate to severe right neural foraminal stenosis with suspected right L5 nerve root impingement. No spinal stenosis.  IMPRESSION: 1. New right foraminal disc protrusion at L5-S1 resulting in severe foraminal stenosis and L5 nerve root impingement. 2. Decreased size of left foraminal disc extrusion at L4-5. 3. Stable to minimally progressive disc degeneration at L3-4 with at most mild neural foraminal stenosis.  Electronically Signed   By: Logan Bores M.D.   On: 08/31/2018 10:48  Complexity Note: Imaging results reviewed. Results shared with Ms. Skillern, using Layman's terms.                         Meds   Current Outpatient Medications:  .  ALPRAZolam (XANAX) 0.5 MG tablet, Take 1 tablet (0.5 mg total) by mouth 2 (two) times daily as needed for anxiety., Disp: 60 tablet, Rfl: 0 .  aspirin 81 MG tablet, Take 81 mg by mouth daily., Disp: , Rfl:  .  bisoprolol-hydrochlorothiazide (ZIAC) 10-6.25 MG tablet, Take 1 tablet by mouth daily., Disp:  90 tablet, Rfl: 0 .  Cholecalciferol (CVS D3) 2000 units CAPS, Take 2,000 Units by mouth daily. , Disp: , Rfl:  .  Fluticasone-Umeclidin-Vilant 100-62.5-25 MCG/INH AEPB, Inhale 1 puff into the lungs daily., Disp: , Rfl:  .  gabapentin (NEURONTIN) 300 MG capsule, Take 1 capsule (300 mg total) by mouth 4 (four) times daily., Disp: 120 capsule, Rfl: 3 .  HYDROcodone-acetaminophen (NORCO/VICODIN) 5-325 MG tablet, Take 1 tablet by mouth every 4 (four) hours as needed for moderate pain., Disp: 20 tablet, Rfl: 0 .  ibuprofen (ADVIL,MOTRIN) 200 MG tablet, Take 1,200 mg by mouth every 6 (six) hours as needed for headache or moderate pain., Disp: , Rfl:  .  ipratropium-albuterol (DUONEB) 0.5-2.5 (3) MG/3ML SOLN, Take 3 mLs by nebulization every 6 (six) hours as needed (for shortness of breath or wheezing)., Disp: , Rfl:  .  levothyroxine (SYNTHROID, LEVOTHROID) 25 MCG tablet, Take 25 mcg by mouth daily before breakfast. DUPLICATE ENTRY, Disp: , Rfl:  .  OXYGEN, Inhale 2.5 L into the lungs continuous. Reported on 11/03/2015, Disp: , Rfl:  .  potassium chloride SA (K-DUR,KLOR-CON) 20 MEQ tablet, Take 1 tablet (20 mEq total) by mouth 2 (two) times daily., Disp: 180 tablet, Rfl: 3 .  ranitidine (ZANTAC) 150 MG tablet, Take 150 mg by mouth 2 (two) times daily., Disp: , Rfl:  .  sertraline (ZOLOFT) 100 MG tablet, TAKE 2 TABLETS(200 MG) BY MOUTH EVERY MORNING, Disp: 180 tablet, Rfl: 0 .  VENTOLIN HFA 108 (90 Base) MCG/ACT inhaler, Inhale 2 puffs into the lungs every  6 (six) hours as needed for shortness of breath., Disp: 1 Inhaler, Rfl: 5 .  [START ON 01/18/2019] HYDROcodone-acetaminophen (NORCO/VICODIN) 5-325 MG tablet, Take 1 tablet by mouth every 8 (eight) hours as needed for up to 30 days for severe pain., Disp: 90 tablet, Rfl: 0 .  [START ON 12/19/2018] HYDROcodone-acetaminophen (NORCO/VICODIN) 5-325 MG tablet, Take 1 tablet by mouth every 8 (eight) hours as needed for up to 30 days for severe pain., Disp: 90 tablet,  Rfl: 0 .  [START ON 11/19/2018] HYDROcodone-acetaminophen (NORCO/VICODIN) 5-325 MG tablet, Take 1 tablet by mouth every 8 (eight) hours as needed for up to 30 days for severe pain., Disp: 90 tablet, Rfl: 0 .  torsemide (DEMADEX) 20 MG tablet, Take 2 tablets (40 mg total) by mouth 2 (two) times daily., Disp: 120 tablet, Rfl: 3  ROS  Constitutional: Denies any fever or chills Gastrointestinal: No reported hemesis, hematochezia, vomiting, or acute GI distress Musculoskeletal: Denies any acute onset joint swelling, redness, loss of ROM, or weakness Neurological: No reported episodes of acute onset apraxia, aphasia, dysarthria, agnosia, amnesia, paralysis, loss of coordination, or loss of consciousness  Allergies  Ms. Schouten is allergic to codeine and meloxicam.  PFSH  Drug: Ms. Bauers  reports no history of drug use. Alcohol:  reports no history of alcohol use. Tobacco:  reports that she has been smoking cigarettes. She has a 44.00 pack-year smoking history. She has never used smokeless tobacco. Medical:  has a past medical history of Acute postoperative pain (10/03/2017), Anxiety, BiPAP (biphasic positive airway pressure) dependence, Carpal tunnel syndrome, CHF (congestive heart failure) ([place]), CHF (congestive heart failure) ([place]), Chronic generalized abdominal pain, Chronic rhinitis, COPD (chronic obstructive pulmonary disease) ([place]), DDD (degenerative disc disease), cervical, DDD (degenerative disc disease), lumbosacral, Depression, Dyspnea, Edema, Flu, Gastritis, GERD (gastroesophageal reflux disease), Hematuria, Hernia of abdominal wall (07/17/2018), Hernia, abdominal, Hypertension, Kidney stones, Low back pain, Lumbar radiculitis, Malodorous urine, Muscle weakness, Obesity, Oxygen dependent, Renal cyst, Sensory urge incontinence, Thyroid activity decreased, Tobacco abuse, and Wheezing. Surgical: Ms. Mahone  has a past surgical history that includes Abdominal hysterectomy; Tonsillectomy; Carpal  tunnel release (Left, 2012); Tubal ligation; epigastric hernia repair (N/A, 80/88/1103); and Umbilical hernia repair (N/A, 08/13/2018). Family: family history includes Alcohol abuse in her father; Breast cancer in her sister; Cancer in her brother, brother, and sister; Coronary artery disease in her mother; Heart disease in her father and mother; Lung cancer in her sister; Pneumonia in her brother; Stroke in her mother.  Constitutional Exam  General appearance: Well nourished, well developed, and well hydrated. In no apparent acute distress Vitals:   10/29/18 1054  BP: 108/64  Pulse: 66  Temp: 97.7 F (36.5 C)  SpO2: 90%  Weight: 244 lb (110.7 kg)  Height: _0  (1.575 m)  Psych/Mental status: Alert, oriented x 3 (person, place, & time)       Eyes: PERLA Respiratory: Oxygen-dependent COPD  Lumbar Spine Area Exam  Skin & Axial Inspection: No masses, redness, or swelling Alignment: Symmetrical Functional ROM: Unrestricted ROM       Stability: No instability detected Muscle Tone/Strength: Functionally intact. No obvious neuro-muscular anomalies detected. Sensory (Neurological): Unimpaired Palpation: Complains of area being tender to palpation       Provocative Tests: Hyperextension/rotation test: deferred today       Lumbar quadrant test (Kemp's test): deferred today       Lateral bending test: deferred today       Patrick's Maneuver: deferred today  Gait & Posture Assessment  Ambulation: Patient ambulates using a wheel chair Gait: Relatively normal for age and body habitus Posture: WNL   Lower Extremity Exam    Side: Right lower extremity  Side: Left lower extremity  Stability: No instability observed          Stability: No instability observed          Skin & Extremity Inspection: Skin color, temperature, and hair growth are WNL. No peripheral edema or cyanosis. No masses, redness, swelling, asymmetry, or associated skin lesions. No contractures.  Skin &  Extremity Inspection: Skin color, temperature, and hair growth are WNL. No peripheral edema or cyanosis. No masses, redness, swelling, asymmetry, or associated skin lesions. No contractures.  Functional ROM: Unrestricted ROM                  Functional ROM: Unrestricted ROM                  Muscle Tone/Strength: Functionally intact. No obvious neuro-muscular anomalies detected.  Muscle Tone/Strength: Functionally intact. No obvious neuro-muscular anomalies detected.  Sensory (Neurological): Unimpaired        Sensory (Neurological): Unimpaired                 Assessment  Primary Diagnosis & Pertinent Problem List: The primary encounter diagnosis was Lumbar spondylosis (L4-5). Diagnoses of Spinal stenosis of lumbar region with neurogenic claudication, Chronic sacroiliac joint pain (Bilateral), Chronic pain syndrome, and Long term current use of opiate analgesic were also pertinent to this visit.  Status Diagnosis  Controlled Controlled Controlled 1. Lumbar spondylosis (L4-5)   2. Spinal stenosis of lumbar region with neurogenic claudication   3. Chronic sacroiliac joint pain (Bilateral)   4. Chronic pain syndrome   5. Long term current use of opiate analgesic     Problems updated and reviewed during this visit: No problems updated. Plan of Care  Pharmacotherapy (Medications Ordered): Meds ordered this encounter  Medications  . DISCONTD: gabapentin (NEURONTIN) 300 MG capsule    Sig: Take 1 capsule (300 mg total) by mouth 4 (four) times daily.    Dispense:  120 capsule    Refill:  2    Do not place medication on "Automatic Refill". Fill one day early if pharmacy is closed on scheduled refill date.    Order Specific Question:   Supervising Provider    Answer:   Milinda Pointer [phone]  . HYDROcodone-acetaminophen (NORCO/VICODIN) 5-325 MG tablet    Sig: Take 1 tablet by mouth every 8 (eight) hours as needed for up to 30 days for severe pain.    Dispense:  90 tablet    Refill:  0     Do not add this medication to the electronic "Automatic Refill" notification system. Patient may have prescription filled one day early if pharmacy is closed on scheduled refill date.    Order Specific Question:   Supervising Provider    Answer:   Milinda Pointer [phone]  . HYDROcodone-acetaminophen (NORCO/VICODIN) 5-325 MG tablet    Sig: Take 1 tablet by mouth every 8 (eight) hours as needed for up to 30 days for severe pain.    Dispense:  90 tablet    Refill:  0    Do not add this medication to the electronic "Automatic Refill" notification system. Patient may have prescription filled one day early if pharmacy is closed on scheduled refill date.    Order Specific Question:   Supervising Provider    Answer:   Dossie Arbour,  FRANCISCO [[id number]]  . HYDROcodone-acetaminophen (NORCO/VICODIN) 5-325 MG tablet    Sig: Take 1 tablet by mouth every 8 (eight) hours as needed for up to 30 days for severe pain.    Dispense:  90 tablet    Refill:  0    Do not add this medication to the electronic "Automatic Refill" notification system. Patient may have prescription filled one day early if pharmacy is closed on scheduled refill date.    Order Specific Question:   Supervising Provider    Answer:   Milinda Pointer [phone]  . gabapentin (NEURONTIN) 300 MG capsule    Sig: Take 1 capsule (300 mg total) by mouth 4 (four) times daily.    Dispense:  120 capsule    Refill:  3    Do not place medication on "Automatic Refill". Fill one day early if pharmacy is closed on scheduled refill date.    Order Specific Question:   Supervising Provider    Answer:   Milinda Pointer [[id number]]   New Prescriptions   No medications on file   Medications administered today: Janyia Guion had no medications administered during this visit. Lab-work, procedure(s), and/or referral(s): No orders of the defined types were placed in this encounter.  Imaging and/or referral(s): None Interventional management  options: Planned, scheduled, and/or pending:   Diagnostic bilateral lumbar facet block #1 after RFA, under fluoroscopic guidance and IV sedation     Considering:   Diagnostic bilateral lumbar facet block Possibleleftlumbar facet RFA(right side done on 10/03/2017: Left side done on 06/27/2017) TherapeuticleftL4-5 interlaminar LESI + leftL4 TFESI   Palliative PRN treatment(s):   Palliative left sided lumbar facetblock Palliative left-sided L4-5 interlaminar LESI + left-sided L4TFESI Diagnostic/therapeuticrightL4-5 interlaminar LESI+ rightL4 transforaminal LESI #3(PRN)     Provider-requested follow-up: Return in about 3 months (around 02/05/2019) for MedMgmt.  Future Appointments  Date Time Provider [place]  11/06/2018  1:00 PM Milinda Pointer, MD [facility] None  01/21/2019  2:20 PM [employer] HEALTH ADVISOR [facility] None  01/30/2019 10:30 AM Vevelyn Francois, NP [facility] None  04/15/2019 10:00 AM Harlin Heys, MD [facility] None  04/17/2019 10:40 AM [name], FNP [facility] None   Primary Care Physician: Margo Common, PA Location: [facility] Note by: Vevelyn Francois NP Date: 10/29/2018; Time: 11:51 AM  Pain Score Disclaimer: We use the NRS-11 scale. This is a self-reported, subjective measurement of pain severity with only modest accuracy. It is used primarily to identify changes within a particular patient. It must be understood that outpatient pain scales are significantly less accurate that those used for research, where they can be applied under ideal controlled circumstances with minimal exposure to variables. In reality, the score is likely to be a combination of pain intensity and pain affect, where pain affect describes the degree of emotional arousal or changes in action readiness caused by the sensory experience of pain. Factors such as social and work situation, setting, emotional state, anxiety levels,  expectation, and prior pain experience may influence pain perception and show large inter-individual differences that may also be affected by time variables.  Patient instructions provided during this appointment: Patient Instructions   ____________________________________________________________________________________________  Medication Rules  Purpose: To inform patients, and their family members, of our rules and regulations.  Applies to: All patients receiving prescriptions (written or electronic).  Pharmacy of record: Pharmacy where electronic prescriptions will be sent. If written prescriptions are taken to a different pharmacy, please inform the nursing staff. The pharmacy listed  in the electronic medical record should be the one where you would like electronic prescriptions to be sent.  Electronic prescriptions: In compliance with the [place] (STOP) Act of 2017 (Session Lanny Cramp [phone]), effective October 17, 2018, all controlled substances must be electronically prescribed. Calling prescriptions to the pharmacy will cease to exist.  Prescription refills: Only during scheduled appointments. Applies to all prescriptions.  NOTE: The following applies primarily to controlled substances (Opioid* Pain Medications).   Patient's responsibilities: 1. Pain Pills: Bring all pain pills to every appointment (except for procedure appointments). 2. Pill Bottles: Bring pills in original pharmacy bottle. Always bring the newest bottle. Bring bottle, even if empty. 3. Medication refills: You are responsible for knowing and keeping track of what medications you take and those you need refilled. The day before your appointment: write a list of all prescriptions that need to be refilled. The day of the appointment: give the list to the admitting nurse. Prescriptions will be written only during appointments. If you forget a medication: it will not be "Called  in", "Faxed", or "electronically sent". You will need to get another appointment to get these prescribed. No early refills. Do not call asking to have your prescription filled early. 4. Prescription Accuracy: You are responsible for carefully inspecting your prescriptions before leaving our office. Have the discharge nurse carefully go over each prescription with you, before taking them home. Make sure that your name is accurately spelled, that your address is correct. Check the name and dose of your medication to make sure it is accurate. Check the number of pills, and the written instructions to make sure they are clear and accurate. Make sure that you are given enough medication to last until your next medication refill appointment. 5. Taking Medication: Take medication as prescribed. When it comes to controlled substances, taking less pills or less frequently than prescribed is permitted and encouraged. Never take more pills than instructed. Never take medication more frequently than prescribed.  6. Inform other Doctors: Always inform, all of your healthcare providers, of all the medications you take. 7. Pain Medication from other Providers: You are not allowed to accept any additional pain medication from any other Doctor or Healthcare provider. There are two exceptions to this rule. (see below) In the event that you require additional pain medication, you are responsible for notifying us, as stated below. 8. Medication Agreement: You are responsible for carefully reading and following our Medication Agreement. This must be signed before receiving any prescriptions from our practice. Safely store a copy of your signed Agreement. Violations to the Agreement will result in no further prescriptions. (Additional copies of our Medication Agreement are available upon request.) 9. Laws, Rules, & Regulations: All patients are expected to follow all Federal and [employer], Rules, [employer].  Ignorance of the Laws does not constitute a valid excuse. The use of any illegal substances is prohibited. 10. Adopted CDC guidelines & recommendations: Target dosing levels will be at or below 60 MME/day. Use of benzodiazepines** is not recommended.  Exceptions: There are only two exceptions to the rule of not receiving pain medications from other Healthcare Providers. 1. Exception #1 (Emergencies): In the event of an emergency (i.e.: accident requiring emergency care), you are allowed to receive additional pain medication. However, you are responsible for: As soon as you are able, call our office [phone], at any time of the day or night, and leave a message stating your name, the date and  nature of the emergency, and the name and dose of the medication prescribed. In the event that your call is answered by a member of our staff, make sure to document and save the date, time, and the name of the person that took your information.  2. Exception #2 (Planned Surgery): In the event that you are scheduled by another doctor or dentist to have any type of surgery or procedure, you are allowed (for a period no longer than 30 days), to receive additional pain medication, for the acute post-op pain. However, in this case, you are responsible for picking up a copy of our "Post-op Pain Management for Surgeons" handout, and giving it to your surgeon or dentist. This document is available at our office, and does not require an appointment to obtain it. Simply go to our office during business hours (Monday-Thursday from 8:00 AM to 4:00 PM) (Friday 8:00 AM to 12:00 Noon) or if you have a scheduled appointment with [place], prior to your surgery, and ask for it by name. In addition, you will need to provide [place] with your name, name of your surgeon, type of surgery, and date of procedure or surgery.  *Opioid medications include: morphine, codeine, oxycodone, oxymorphone, hydrocodone, hydromorphone, meperidine, tramadol,  tapentadol, buprenorphine, fentanyl, methadone. **Benzodiazepine medications include: diazepam (Valium), alprazolam (Xanax), clonazepam (Klonopine), lorazepam (Ativan), clorazepate (Tranxene), chlordiazepoxide (Librium), estazolam (Prosom), oxazepam (Serax), temazepam (Restoril), triazolam (Halcion) (Last updated: 12/14/2017) ____________________________________________________________________________________________    BMI Assessment: Estimated body mass index is 44.63 kg/m as calculated from the following:   Height as of this encounter: _0  (1.575 m).   Weight as of this encounter: 244 lb (110.7 kg).  BMI interpretation table: BMI level Category Range association with higher incidence of chronic pain  <18 kg/m2 Underweight   18.5-24.9 kg/m2 Ideal body weight   25-29.9 kg/m2 Overweight Increased incidence by 20%  30-34.9 kg/m2 Obese (Class I) Increased incidence by 68%  35-39.9 kg/m2 Severe obesity (Class II) Increased incidence by 136%  >40 kg/m2 Extreme obesity (Class III) Increased incidence by 254%   Patient's current BMI Ideal Body weight  Body mass index is 44.63 kg/m. Ideal body weight: 50.1 kg (110 lb 7.2 oz) Adjusted ideal body weight: 74.3 kg (163 lb 13.9 oz)   BMI Readings from Last 4 Encounters:  10/29/18 44.63 kg/m  10/25/18 44.86 kg/m  09/24/18 43.22 kg/m  09/20/18 48.24 kg/m   Wt Readings from Last 4 Encounters:  10/29/18 244 lb (110.7 kg)  10/25/18 245 lb 4 oz (111.2 kg)  09/24/18 244 lb (110.7 kg)  09/20/18 247 lb (112 kg)

## 2018-10-31 DIAGNOSIS — J9610 Chronic respiratory failure, unspecified whether with hypoxia or hypercapnia: Secondary | ICD-10-CM | POA: Diagnosis not present

## 2018-11-03 DIAGNOSIS — J9610 Chronic respiratory failure, unspecified whether with hypoxia or hypercapnia: Secondary | ICD-10-CM | POA: Diagnosis not present

## 2018-11-06 ENCOUNTER — Ambulatory Visit: Payer: Medicare HMO | Admitting: Pain Medicine

## 2018-11-06 NOTE — Progress Notes (Deleted)
Patient's Name: Theresa [name]  MRN: [id number]  Referring Provider: Margo Common, [place]  DOB: [birth year]  PCP: Margo Common, PA  DOS: 11/06/2018  Note by: Gaspar Cola, MD  Service setting: Ambulatory outpatient  Specialty: Interventional Pain Management  Patient type: Established  Location: [facility] (AMB) Pain Management Facility  Visit type: Interventional Procedure   Primary Reason for Visit: Interventional Pain Management Treatment. CC: No chief complaint on file.  Procedure:          Anesthesia, Analgesia, Anxiolysis:  Type: Lumbar Facet, Medial Branch Block(s)          Primary Purpose: Diagnostic Region: Posterolateral Lumbosacral Spine Level: L2, L3, L4, L5, & S1 Medial Branch Level(s). Injecting these levels blocks the L3-4, L4-5, and L5-S1 lumbar facet joints. Laterality: Bilateral  Type: Moderate (Conscious) Sedation combined with Local Anesthesia Indication(s): Analgesia and Anxiety Route: Intravenous (IV) IV Access: Secured Sedation: Meaningful verbal contact was maintained at all times during the procedure  Local Anesthetic: Lidocaine 1-2%  Position: Prone   Indications: 1. Spondylosis without myelopathy or radiculopathy, lumbosacral region   2. Lumbar facet syndrome (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)   3. Lumbar facet hypertrophy (multilevel)   4. Lumbar spondylosis (L4-5)   5. Chronic low back pain (Primary Area of Pain) (Left)    Pain Score: Pre-procedure:  /10 Post-procedure:  /10  Pre-op Assessment:  [name] is a 63 y.o. (year old), female patient, seen today for interventional treatment. She  has a past surgical history that includes Abdominal hysterectomy; Tonsillectomy; Carpal tunnel release (Left, 2012); Tubal ligation; epigastric hernia repair (N/A, 30/86/5784); and Umbilical hernia repair (N/A, 08/13/2018). [name] has a current medication list which includes the following prescription(s): alprazolam, aspirin, bisoprolol-hydrochlorothiazide,  cholecalciferol, fluticasone-umeclidin-vilant, gabapentin, hydrocodone-acetaminophen, hydrocodone-acetaminophen, hydrocodone-acetaminophen, hydrocodone-acetaminophen, ibuprofen, ipratropium-albuterol, levothyroxine, oxygen-helium, potassium chloride sa, ranitidine, sertraline, torsemide, and ventolin hfa. Her primarily concern today is the No chief complaint on file.  Initial Vital Signs:  Pulse/HCG Rate:    Temp:   Resp:   BP:   SpO2:    BMI: Estimated body mass index is 44.63 kg/m as calculated from the following:   Height as of 10/29/18: _0  (1.575 m).   Weight as of 10/29/18: 244 lb (110.7 kg).  Risk Assessment: Allergies: Reviewed. She is allergic to codeine and meloxicam.  Allergy Precautions: None required Coagulopathies: Reviewed. None identified.  Blood-thinner therapy: None at this time Active Infection(s): Reviewed. None identified. [name] is afebrile  Site Confirmation: [name] was asked to confirm the procedure and laterality before marking the site Procedure checklist: Completed Consent: Before the procedure and under the influence of no sedative(s), amnesic(s), or anxiolytics, the patient was informed of the treatment options, risks and possible complications. To fulfill our ethical and legal obligations, as recommended by the American Medical Association's Code of Ethics, I have informed the patient of my clinical impression; the nature and purpose of the treatment or procedure; the risks, benefits, and possible complications of the intervention; the alternatives, including doing nothing; the risk(s) and benefit(s) of the alternative treatment(s) or procedure(s); and the risk(s) and benefit(s) of doing nothing. The patient was provided information about the general risks and possible complications associated with the procedure. These may include, but are not limited to: failure to achieve desired goals, infection, bleeding, organ or nerve damage, allergic reactions,  paralysis, and death. In addition, the patient was informed of those risks and complications associated to Spine-related procedures, such as failure to decrease pain; infection (i.e.: Meningitis, epidural or intraspinal  abscess); bleeding (i.e.: epidural hematoma, subarachnoid hemorrhage, or any other type of intraspinal or peri-dural bleeding); organ or nerve damage (i.e.: Any type of peripheral nerve, nerve root, or spinal cord injury) with subsequent damage to sensory, motor, and/or autonomic systems, resulting in permanent pain, numbness, and/or weakness of one or several areas of the body; allergic reactions; (i.e.: anaphylactic reaction); and/or death. Furthermore, the patient was informed of those risks and complications associated with the medications. These include, but are not limited to: allergic reactions (i.e.: anaphylactic or anaphylactoid reaction(s)); adrenal axis suppression; blood sugar elevation that in diabetics may result in ketoacidosis or comma; water retention that in patients with history of congestive heart failure may result in shortness of breath, pulmonary edema, and decompensation with resultant heart failure; weight gain; swelling or edema; medication-induced neural toxicity; particulate matter embolism and blood vessel occlusion with resultant organ, and/or nervous system infarction; and/or aseptic necrosis of one or more joints. Finally, the patient was informed that Medicine is not an exact science; therefore, there is also the possibility of unforeseen or unpredictable risks and/or possible complications that may result in a catastrophic outcome. The patient indicated having understood very clearly. We have given the patient no guarantees and we have made no promises. Enough time was given to the patient to ask questions, all of which were answered to the patient's satisfaction. [name] has indicated that she wanted to continue with the procedure. Attestation: I, the  ordering provider, attest that I have discussed with the patient the benefits, risks, side-effects, alternatives, likelihood of achieving goals, and potential problems during recovery for the procedure that I have provided informed consent. Date  Time: {CHL [facility]-PAIN TIME CHOICES:[id number]}  Pre-Procedure Preparation:  Monitoring: As per clinic protocol. Respiration, ETCO2, SpO2, BP, heart rate and rhythm monitor placed and checked for adequate function Safety Precautions: Patient was assessed for positional comfort and pressure points before starting the procedure. Time-out: I initiated and conducted the "Time-out" before starting the procedure, as per protocol. The patient was asked to participate by confirming the accuracy of the "Time Out" information. Verification of the correct person, site, and procedure were performed and confirmed by me, the nursing staff, and the patient. "Time-out" conducted as per Joint Commission's Universal Protocol (UP.01.01.01). Time:    Description of Procedure:          Laterality:  ***  Levels:  L2, L3, L4, L5, & S1 Medial Branch Level(s) Area Prepped: Posterior Lumbosacral Region Prepping solution: ChloraPrep (2% chlorhexidine gluconate and 70% isopropyl alcohol) Safety Precautions: Aspiration looking for blood return was conducted prior to all injections. At no point did we inject any substances, as a needle was being advanced. Before injecting, the patient was told to immediately notify me if she was experiencing any new onset of "ringing in the ears, or metallic taste in the mouth". No attempts were made at seeking any paresthesias. Safe injection practices and needle disposal techniques used. Medications properly checked for expiration dates. SDV (single dose vial) medications used. After the completion of the procedure, all disposable equipment used was discarded in the proper designated medical waste containers. Local Anesthesia: Protocol guidelines were  followed. The patient was positioned over the fluoroscopy table. The area was prepped in the usual manner. The time-out was completed. The target area was identified using fluoroscopy. A 12-in long, straight, sterile hemostat was used with fluoroscopic guidance to locate the targets for each level blocked. Once located, the skin was marked with an approved surgical skin marker. Once  all sites were marked, the skin (epidermis, dermis, and hypodermis), as well as deeper tissues (fat, connective tissue and muscle) were infiltrated with a small amount of a short-acting local anesthetic, loaded on a 10cc syringe with a 25G, 1.5-in  Needle. An appropriate amount of time was allowed for local anesthetics to take effect before proceeding to the next step. Local Anesthetic: Lidocaine 2.0% The unused portion of the local anesthetic was discarded in the proper designated containers. Technical explanation of process:  L2 Medial Branch Nerve Block (MBB): The target area for the L2 medial branch is at the junction of the postero-lateral aspect of the superior articular process and the superior, posterior, and medial edge of the transverse process of L3. Under fluoroscopic guidance, a Quincke needle was inserted until contact was made with os over the superior postero-lateral aspect of the pedicular shadow (target area). After negative aspiration for blood, 0.5 mL of the nerve block solution was injected without difficulty or complication. The needle was removed intact. L3 Medial Branch Nerve Block (MBB): The target area for the L3 medial branch is at the junction of the postero-lateral aspect of the superior articular process and the superior, posterior, and medial edge of the transverse process of L4. Under fluoroscopic guidance, a Quincke needle was inserted until contact was made with os over the superior postero-lateral aspect of the pedicular shadow (target area). After negative aspiration for blood, 0.5 mL of the nerve  block solution was injected without difficulty or complication. The needle was removed intact. L4 Medial Branch Nerve Block (MBB): The target area for the L4 medial branch is at the junction of the postero-lateral aspect of the superior articular process and the superior, posterior, and medial edge of the transverse process of L5. Under fluoroscopic guidance, a Quincke needle was inserted until contact was made with os over the superior postero-lateral aspect of the pedicular shadow (target area). After negative aspiration for blood, 0.5 mL of the nerve block solution was injected without difficulty or complication. The needle was removed intact. L5 Medial Branch Nerve Block (MBB): The target area for the L5 medial branch is at the junction of the postero-lateral aspect of the superior articular process and the superior, posterior, and medial edge of the sacral ala. Under fluoroscopic guidance, a Quincke needle was inserted until contact was made with os over the superior postero-lateral aspect of the pedicular shadow (target area). After negative aspiration for blood, 0.5 mL of the nerve block solution was injected without difficulty or complication. The needle was removed intact. S1 Medial Branch Nerve Block (MBB): The target area for the S1 medial branch is at the posterior and inferior 6 o'clock position of the L5-S1 facet joint. Under fluoroscopic guidance, the Quincke needle inserted for the L5 MBB was redirected until contact was made with os over the inferior and postero aspect of the sacrum, at the 6 o' clock position under the L5-S1 facet joint (Target area). After negative aspiration for blood, 0.5 mL of the nerve block solution was injected without difficulty or complication. The needle was removed intact. Procedural Needles: 22-gauge, 3.5-inch, Quincke needles used for all levels. Nerve block solution: 0.2% PF-Ropivacaine + Triamcinolone (40 mg/mL) diluted to a final concentration of 4 mg of  Triamcinolone/mL of Ropivacaine The unused portion of the solution was discarded in the proper designated containers.  Once the entire procedure was completed, the treated area was cleaned, making sure to leave some of the prepping solution back to take advantage of its long  term bactericidal properties.   Illustration of the posterior view of the lumbar spine and the posterior neural structures. Laminae of L2 through S1 are labeled. DPRL5, dorsal primary ramus of L5; DPRS1, dorsal primary ramus of S1; DPR3, dorsal primary ramus of L3; FJ, facet (zygapophyseal) joint L3-L4; I, inferior articular process of L4; LB1, lateral branch of dorsal primary ramus of L1; IAB, inferior articular branches from L3 medial branch (supplies L4-L5 facet joint); IBP, intermediate branch plexus; MB3, medial branch of dorsal primary ramus of L3; NR3, third lumbar nerve root; S, superior articular process of L5; SAB, superior articular branches from L4 (supplies L4-5 facet joint also); TP3, transverse process of L3.  There were no vitals filed for this visit.   Start Time:   hrs. End Time:   hrs.  Imaging Guidance (Spinal):          Type of Imaging Technique: Fluoroscopy Guidance (Spinal) Indication(s): Assistance in needle guidance and placement for procedures requiring needle placement in or near specific anatomical locations not easily accessible without such assistance. Exposure Time: Please see nurses notes. Contrast: None used. Fluoroscopic Guidance: I was personally present during the use of fluoroscopy. "Tunnel Vision Technique" used to obtain the best possible view of the target area. Parallax error corrected before commencing the procedure. "Direction-depth-direction" technique used to introduce the needle under continuous pulsed fluoroscopy. Once target was reached, antero-posterior, oblique, and lateral fluoroscopic projection used confirm needle placement in all planes. Images permanently stored in  EMR. Interpretation: No contrast injected. I personally interpreted the imaging intraoperatively. Adequate needle placement confirmed in multiple planes. Permanent images saved into the patient's record.  Antibiotic Prophylaxis:   Anti-infectives (From admission, onward)   None     Indication(s): None identified  Post-operative Assessment:  Post-procedure Vital Signs:  Pulse/HCG Rate:    Temp:   Resp:   BP:   SpO2:    EBL: None  Complications: No immediate post-treatment complications observed by team, or reported by patient.  Note: The patient tolerated the entire procedure well. A repeat set of vitals were taken after the procedure and the patient was kept under observation following institutional policy, for this type of procedure. Post-procedural neurological assessment was performed, showing return to baseline, prior to discharge. The patient was provided with post-procedure discharge instructions, including a section on how to identify potential problems. Should any problems arise concerning this procedure, the patient was given instructions to immediately contact us, at any time, without hesitation. In any case, we plan to contact the patient by telephone for a follow-up status report regarding this interventional procedure.  Comments:  No additional relevant information.  Plan of Care  Interventional management options: Planned, scheduled, and/or pending:   NOTE: NO Lumbar RFA until BMI <35 None at this time. Patient needs to bring BMI below 35 and quit smoking.   Considering:   Please see palliative treatments.   Palliative PRN treatment(s):   Palliative right-sided lumbar facet RFA (last done 10/03/2017) (NO RFA until BMI<35)  Palliative left-sided lumbar facet RFA (last done 06/27/2017) (NO RFA until BMI<35)  Palliative lumbar facetblock Palliative left-sided L4-5 interlaminar LESI + left-sided L4TFESI Diagnostic/therapeuticrightL4-5 interlaminar LESI+  rightL4 transforaminal LESI #3(PRN)   Imaging Orders  No imaging studies ordered today   Procedure Orders    No procedure(s) ordered today    Medications ordered for procedure: No orders of the defined types were placed in this encounter.  Medications administered: [name] had no medications administered during this visit.  See  the medical record for exact dosing, route, and time of administration.  Disposition: Discharge home  Discharge Date & Time: 11/06/2018;   hrs.   Physician-requested Follow-up: No follow-ups on file.  Future Appointments  Date Time Provider [place]  11/06/2018  1:00 PM Milinda Pointer, MD [facility] None  01/21/2019  2:20 PM [employer] HEALTH ADVISOR [facility] None  01/30/2019 10:30 AM Vevelyn Francois, NP [facility] None  04/15/2019 10:00 AM Harlin Heys, MD [facility] None  04/17/2019 10:40 AM [name], FNP [facility] None   Primary Care Physician: Margo Common, PA Location: [facility] Note by: Gaspar Cola, MD Date: 11/06/2018; Time: 8:20 AM  Disclaimer:  Medicine is not an Chief Strategy Officer. The only guarantee in medicine is that nothing is guaranteed. It is important to note that the decision to proceed with this intervention was based on the information collected from the patient. The Data and conclusions were drawn from the patient's questionnaire, the interview, and the physical examination. Because the information was provided in large part by the patient, it cannot be guaranteed that it has not been purposely or unconsciously manipulated. Every effort has been made to obtain as much relevant data as possible for this evaluation. It is important to note that the conclusions that lead to this procedure are derived in large part from the available data. Always take into account that the treatment will also be dependent on availability of resources and existing treatment guidelines, considered by  other Pain Management Practitioners as being common knowledge and practice, at the time of the intervention. For Medico-Legal purposes, it is also important to point out that variation in procedural techniques and pharmacological choices are the acceptable norm. The indications, contraindications, technique, and results of the above procedure should only be interpreted and judged by a Board-Certified Interventional Pain Specialist with extensive familiarity and expertise in the same exact procedure and technique.

## 2018-11-16 ENCOUNTER — Ambulatory Visit (INDEPENDENT_AMBULATORY_CARE_PROVIDER_SITE_OTHER): Payer: Medicare HMO | Admitting: Family Medicine

## 2018-11-16 ENCOUNTER — Encounter: Payer: Self-pay | Admitting: Family Medicine

## 2018-11-16 VITALS — BP 118/72 | HR 72 | Temp 99.0°F | Resp 20

## 2018-11-16 DIAGNOSIS — F411 Generalized anxiety disorder: Secondary | ICD-10-CM | POA: Diagnosis not present

## 2018-11-16 DIAGNOSIS — R5383 Other fatigue: Secondary | ICD-10-CM

## 2018-11-16 DIAGNOSIS — I5032 Chronic diastolic (congestive) heart failure: Secondary | ICD-10-CM

## 2018-11-16 DIAGNOSIS — M47817 Spondylosis without myelopathy or radiculopathy, lumbosacral region: Secondary | ICD-10-CM | POA: Diagnosis not present

## 2018-11-16 DIAGNOSIS — J449 Chronic obstructive pulmonary disease, unspecified: Secondary | ICD-10-CM

## 2018-11-16 DIAGNOSIS — I2721 Secondary pulmonary arterial hypertension: Secondary | ICD-10-CM

## 2018-11-16 DIAGNOSIS — E039 Hypothyroidism, unspecified: Secondary | ICD-10-CM | POA: Diagnosis not present

## 2018-11-16 DIAGNOSIS — R0902 Hypoxemia: Secondary | ICD-10-CM | POA: Diagnosis not present

## 2018-11-16 MED ORDER — SERTRALINE HCL 100 MG PO TABS: ORAL_TABLET | ORAL | 3 refills | Status: DC

## 2018-11-16 NOTE — Progress Notes (Addendum)
Patient: Theresa [name] Female    DOB: [birth year]   63 y.o.   MRN: [id number] Visit Date: 11/16/2018  Today's Provider: [name], PA   Chief Complaint  Patient presents with  . Fatigue   Subjective:     HPI  Patient comes in today c/o excessive fatigue. She reports that it has worsened over the last month. She reports that she gets extremely tired just doing daily activities.   Past Medical History:  Diagnosis Date  . Acute postoperative pain 10/03/2017  . Anxiety   . BiPAP (biphasic positive airway pressure) dependence    at hs  . Carpal tunnel syndrome   . CHF (congestive heart failure) ([place])    1/18  . CHF (congestive heart failure) ([place])   . Chronic generalized abdominal pain   . Chronic rhinitis   . COPD (chronic obstructive pulmonary disease) ([place])   . DDD (degenerative disc disease), cervical   . DDD (degenerative disc disease), lumbosacral   . Depression   . Dyspnea   . Edema   . Flu    1/18  . Gastritis   . GERD (gastroesophageal reflux disease)   . Hematuria   . Hernia of abdominal wall 07/17/2018  . Hernia, abdominal   . Hypertension   . Kidney stones   . Low back pain   . Lumbar radiculitis   . Malodorous urine   . Muscle weakness   . Obesity   . Oxygen dependent   . Renal cyst   . Sensory urge incontinence   . Thyroid activity decreased    9/19  . Tobacco abuse   . Wheezing    Past Surgical History:  Procedure Laterality Date  . ABDOMINAL HYSTERECTOMY    . CARPAL TUNNEL RELEASE Left 2012  . EPIGASTRIC HERNIA REPAIR N/A 08/13/2018   HERNIA REPAIR EPIGASTRIC ADULT; polypropylene mesh reinforcement.  Surgeon: Robert Bellow, MD;  Location: [facility] ORS;  Service: General;  Laterality: N/A;  . TONSILLECTOMY    . TUBAL LIGATION    . UMBILICAL HERNIA REPAIR N/A 08/13/2018   HERNIA REPAIR UMBILICAL ADULT; polypropylene mesh reinforcement Surgeon: Robert Bellow, MD;  Location: [facility];  Service: General;  Laterality: N/A;    Family History  Problem Relation Age of Onset  . Heart disease Mother   . Stroke Mother   . Coronary artery disease Mother   . Lung cancer Sister   . Cancer Sister   . Breast cancer Sister   . Alcohol abuse Father   . Heart disease Father   . Cancer Brother   . Cancer Brother   . Pneumonia Brother   . Prostate cancer Neg Hx   . Kidney cancer Neg Hx   . Bladder Cancer Neg Hx    Allergies  Allergen Reactions  . Codeine Nausea And Vomiting  . Meloxicam Other (See Comments)    Stomach pain    Current Outpatient Medications:  .  ALPRAZolam (XANAX) 0.5 MG tablet, Take 1 tablet (0.5 mg total) by mouth 2 (two) times daily as needed for anxiety., Disp: 60 tablet, Rfl: 0 .  aspirin 81 MG tablet, Take 81 mg by mouth daily., Disp: , Rfl:  .  bisoprolol-hydrochlorothiazide (ZIAC) 10-6.25 MG tablet, Take 1 tablet by mouth daily., Disp: 90 tablet, Rfl: 0 .  Cholecalciferol ([employer] D3) 2000 units CAPS, Take 2,000 Units by mouth daily. , Disp: , Rfl:  .  Fluticasone-Umeclidin-Vilant 100-62.5-25 MCG/INH AEPB, Inhale 1 puff into the lungs daily.,  Disp: , Rfl:  .  gabapentin (NEURONTIN) 300 MG capsule, Take 1 capsule (300 mg total) by mouth 4 (four) times daily., Disp: 120 capsule, Rfl: 3 .  HYDROcodone-acetaminophen (NORCO/VICODIN) 5-325 MG tablet, Take 1 tablet by mouth every 4 (four) hours as needed for moderate pain., Disp: 20 tablet, Rfl: 0 .  [START ON 01/18/2019] HYDROcodone-acetaminophen (NORCO/VICODIN) 5-325 MG tablet, Take 1 tablet by mouth every 8 (eight) hours as needed for up to 30 days for severe pain., Disp: 90 tablet, Rfl: 0 .  [START ON 12/19/2018] HYDROcodone-acetaminophen (NORCO/VICODIN) 5-325 MG tablet, Take 1 tablet by mouth every 8 (eight) hours as needed for up to 30 days for severe pain., Disp: 90 tablet, Rfl: 0 .  [START ON 11/19/2018] HYDROcodone-acetaminophen (NORCO/VICODIN) 5-325 MG tablet, Take 1 tablet by mouth every 8 (eight) hours as needed for up to 30 days for severe pain.,  Disp: 90 tablet, Rfl: 0 .  ibuprofen (ADVIL,MOTRIN) 200 MG tablet, Take 1,200 mg by mouth every 6 (six) hours as needed for headache or moderate pain., Disp: , Rfl:  .  ipratropium-albuterol (DUONEB) 0.5-2.5 (3) MG/3ML SOLN, Take 3 mLs by nebulization every 6 (six) hours as needed (for shortness of breath or wheezing)., Disp: , Rfl:  .  levothyroxine (SYNTHROID, LEVOTHROID) 25 MCG tablet, Take 25 mcg by mouth daily before breakfast. DUPLICATE ENTRY, Disp: , Rfl:  .  OXYGEN, Inhale 2.5 L into the lungs continuous. Reported on 11/03/2015, Disp: , Rfl:  .  potassium chloride SA (K-DUR,KLOR-CON) 20 MEQ tablet, Take 1 tablet (20 mEq total) by mouth 2 (two) times daily., Disp: 180 tablet, Rfl: 3 .  ranitidine (ZANTAC) 150 MG tablet, Take 150 mg by mouth 2 (two) times daily., Disp: , Rfl:  .  sertraline (ZOLOFT) 100 MG tablet, TAKE 2 TABLETS(200 MG) BY MOUTH EVERY MORNING, Disp: 180 tablet, Rfl: 0 .  VENTOLIN HFA 108 (90 Base) MCG/ACT inhaler, Inhale 2 puffs into the lungs every 6 (six) hours as needed for shortness of breath., Disp: 1 Inhaler, Rfl: 5 .  torsemide (DEMADEX) 20 MG tablet, Take 2 tablets (40 mg total) by mouth 2 (two) times daily., Disp: 120 tablet, Rfl: 3  Review of Systems  Constitutional: Positive for activity change and fatigue. Negative for appetite change, chills, diaphoresis, fever and unexpected weight change.  Respiratory: Positive for cough and shortness of breath.        Has COPD  Cardiovascular: Negative for chest pain and leg swelling.  Musculoskeletal: Positive for arthralgias.  Allergic/Immunologic: Negative for environmental allergies.  Neurological: Positive for headaches. Negative for dizziness and light-headedness.  Psychiatric/Behavioral: Positive for sleep disturbance. Negative for self-injury. The patient is not nervous/anxious.    Social History   Tobacco Use  . Smoking status: Current Every Day Smoker    Packs/day: 1.00    Years: 44.00    Pack years: 44.00     Types: Cigarettes  . Smokeless tobacco: Never Used  Substance Use Topics  . Alcohol use: No    Frequency: Never     Objective:   BP 118/72 (BP Location: Right Arm, Patient Position: Sitting, Cuff Size: Large)   Pulse 72   Temp 99 F (37.2 C)   Resp 20   SpO2 92% Comment: with 2.5L of O2 Vitals:   11/16/18 1016  BP: 118/72  Pulse: 72  Resp: 20  Temp: 99 F (37.2 C)  SpO2: 92%   Physical Exam Constitutional:      Appearance: She is obese.  HENT:  Head: Normocephalic.     Right Ear: Tympanic membrane normal.     Left Ear: Tympanic membrane normal.     Nose: Nose normal.     Mouth/Throat:     Pharynx: Oropharynx is clear.  Eyes:     Extraocular Movements: Extraocular movements intact.     Conjunctiva/sclera: Conjunctivae normal.  Cardiovascular:     Rate and Rhythm: Normal rate and regular rhythm.     Heart sounds: Normal heart sounds.  Pulmonary:     Breath sounds: No rales.     Comments: Dyspnea with any exertion today. Presently on 2.5 LPM oxygen by nasal cannula. No rales or rhonchi today.  Chest:     Chest wall: No tenderness.  Abdominal:     General: Bowel sounds are normal.     Palpations: Abdomen is soft.  Musculoskeletal:     Comments: Low back pain radiating down both legs with some numbness in feet. Can't stand much without more pain and worsening dyspnea.  Neurological:     Mental Status: She is alert and oriented to person, place, and time.  Psychiatric:        Behavior: Behavior normal.        Thought Content: Thought content normal.       Assessment & Plan    1. Fatigue, unspecified type Fatigue and weakness worsened over the past couple months. Oxygen level stays low even with supplemental oxygen and pulmonary therapies. With history of PAH, CHF, COPD with hypoxia (continues to smoke 1ppd) and hypothyroidism, will get CBC, CMP, TSH and T4 blood tests to assess condition/control. - CBC with Differential/Platelet - Comprehensive metabolic  panel - TSH - T4  2. Chronic diastolic heart failure (HCC) History of NYHA Class III followed at CHF clinic and by Dr. Clayborn Bigness (cardiologist). Always advised to weigh daily at home and notify CHF clinic if she gains >2 lbs overnight or a weekly gain of >5 lbs. Recheck CBC and CMP today. - CBC with Differential/Platelet - Comprehensive metabolic panel  3. 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[place]) Unchanged and followed by cardiologist and pulmonologist. Recheck routine labs. - CBC with Differential/Platelet - Comprehensive metabolic panel  4. COPD with hypoxia ([place]) Presently on 2.5 LPM of oxygen continuously by nasal cannula with pulse oximetry of 92% (on the oxygen). Uses Duoneb 3 ml qid prn by nebulizer, Trelegy 1 inhalation qd and Ventolin-HFA 2 puffs qid prn. Still having significant dyspnea with weakness and fatigue. Presents in a wheelchair. Recheck CBC with diff. Continue BiPAP nightly and follow up with pulmonologist (Dr. Raul Del) as planned. - CBC with Differential/Platelet  5. Hypothyroidism, unspecified type Still taking Levothyroxine 25 mg qd and will recheck CBC, TSH and T4. - CBC with Differential/Platelet - TSH - T4  6. Spondylosis without myelopathy or radiculopathy, lumbosacral region Chronic back pain in lumbosacral region. Followed by Dr. Dossie Arbour at [facility]. Presently on Neurontin 300 mg qid and Vicodin 5-325 mg TID prn with Ibuprofen 200 mg 4 tablets TID prn. Continue follow up with Pain Management as planned.  7. Anxiety state Episodes of anxiety/panic fairly well controlled with occasional Alprazolam 0.5 mg BID prn (limits use due to severe dyspnea/hypoxia) and Zoloft 100 mg two tablets daily. Needs Zoloft refill. No suicidal ideation. - sertraline (ZOLOFT) 100 MG tablet; TAKE 2 TABLETS(200 MG) BY MOUTH EVERY MORNING  Dispense: 180 tablet; Refill: [place], [facility]

## 2018-11-17 LAB — CBC WITH DIFFERENTIAL/PLATELET
Basophils Absolute: 0.1 10*3/uL (ref 0.0–0.2)
Basos: 1 %
EOS (ABSOLUTE): 0.1 10*3/uL (ref 0.0–0.4)
Eos: 1 %
Hematocrit: 44.6 % (ref 34.0–46.6)
Hemoglobin: 14.8 g/dL (ref 11.1–15.9)
Immature Grans (Abs): 0 10*3/uL (ref 0.0–0.1)
Immature Granulocytes: 0 %
Lymphocytes Absolute: 1.5 10*3/uL (ref 0.7–3.1)
Lymphs: 13 %
MCH: 30.6 pg (ref 26.6–33.0)
MCHC: 33.2 g/dL (ref 31.5–35.7)
MCV: 92 fL (ref 79–97)
Monocytes Absolute: 0.5 10*3/uL (ref 0.1–0.9)
Monocytes: 5 %
Neutrophils Absolute: 9.3 10*3/uL — ABNORMAL HIGH (ref 1.4–7.0)
Neutrophils: 80 %
Platelets: 193 10*3/uL (ref 150–450)
RBC: 4.83 x10E6/uL (ref 3.77–5.28)
RDW: 16 % — ABNORMAL HIGH (ref 11.7–15.4)
WBC: 11.5 10*3/uL — ABNORMAL HIGH (ref 3.4–10.8)

## 2018-11-17 LAB — COMPREHENSIVE METABOLIC PANEL
ALT: 20 IU/L (ref 0–32)
AST: 20 IU/L (ref 0–40)
Albumin/Globulin Ratio: 1.7 (ref 1.2–2.2)
Albumin: 4.3 g/dL (ref 3.8–4.8)
Alkaline Phosphatase: 114 IU/L (ref 39–117)
BUN/Creatinine Ratio: 24 (ref 12–28)
BUN: 20 mg/dL (ref 8–27)
Bilirubin Total: 0.4 mg/dL (ref 0.0–1.2)
CO2: 25 mmol/L (ref 20–29)
Calcium: 9.4 mg/dL (ref 8.7–10.3)
Chloride: 100 mmol/L (ref 96–106)
Creatinine, Ser: 0.82 mg/dL (ref 0.57–1.00)
GFR calc Af Amer: 89 mL/min/{1.73_m2} (ref >59)
GFR calc non Af Amer: 77 mL/min/{1.73_m2} (ref >59)
Globulin, Total: 2.6 g/dL (ref 1.5–4.5)
Glucose: 90 mg/dL (ref 65–99)
Potassium: 4.1 mmol/L (ref 3.5–5.2)
Sodium: 142 mmol/L (ref 134–144)
Total Protein: 6.9 g/dL (ref 6.0–8.5)

## 2018-11-17 LAB — TSH: TSH: 0.805 u[IU]/mL (ref 0.450–4.500)

## 2018-11-17 LAB — T4: T4, Total: 6.1 ug/dL (ref 4.5–12.0)

## 2018-11-20 ENCOUNTER — Other Ambulatory Visit: Payer: Self-pay | Admitting: *Deleted

## 2018-11-20 ENCOUNTER — Telehealth: Payer: Self-pay | Admitting: *Deleted

## 2018-11-20 ENCOUNTER — Ambulatory Visit: Payer: Medicare HMO | Admitting: Pain Medicine

## 2018-11-20 DIAGNOSIS — D72828 Other elevated white blood cell count: Secondary | ICD-10-CM

## 2018-11-20 MED ORDER — DOXYCYCLINE HYCLATE 100 MG PO TABS: 100.0000 mg | ORAL_TABLET | Freq: Two times a day (BID) | ORAL | 0 refills | Status: DC

## 2018-11-20 NOTE — Progress Notes (Deleted)
Patient's Name: Theresa [name]  MRN: [id number]  Referring Provider: Margo [name], [place]  DOB: [birth year]  PCP: Theresa Common, PA  DOS: 11/20/2018  Note by: Theresa Cola, MD  Service setting: Ambulatory outpatient  Specialty: Interventional Pain Management  Patient type: Established  Location: [facility] (AMB) Pain Management Facility  Visit type: Interventional Procedure   Primary Reason for Visit: Interventional Pain Management Treatment. CC: No chief complaint on file.  Procedure:          Anesthesia, Analgesia, Anxiolysis:  Type: Lumbar Facet, Medial Branch Block(s) #4  Primary Purpose: Palliative Region: Posterolateral Lumbosacral Spine Level: L2, L3, L4, L5, & S1 Medial Branch Level(s). Injecting these levels blocks the L3-4, L4-5, and L5-S1 lumbar facet joints. Laterality: Bilateral  Type: Moderate (Conscious) Sedation combined with Local Anesthesia Indication(s): Analgesia and Anxiety Route: Intravenous (IV) IV Access: Secured Sedation: Meaningful verbal contact was maintained at all times during the procedure  Local Anesthetic: Lidocaine 1-2%  Position: Prone   Indications: 1. Spondylosis without myelopathy or radiculopathy, lumbosacral region   2. Lumbar facet syndrome (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)   3. Lumbar facet hypertrophy (multilevel)   4. Chronic low back pain (Primary Area of Pain) (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)   5. COPD with hypoxia ([place])   6. Obesity, Class III, BMI 40-49.9 (morbid obesity) (HCC)    Pain Score: Pre-procedure:  /10 Post-procedure:  /10  Pre-op Assessment:  Theresa [name] is a 63 y.o. (year old), female patient, seen today for interventional treatment. She  has a past surgical history that includes Abdominal hysterectomy; Tonsillectomy; Carpal tunnel release (Left, 2012); Tubal ligation; epigastric hernia repair (N/A, 54/65/6812); and Umbilical hernia repair (N/A, 08/13/2018). Theresa [name] has a current medication list which includes the following prescription(s):  alprazolam, aspirin, bisoprolol-hydrochlorothiazide, cholecalciferol, fluticasone-umeclidin-vilant, gabapentin, hydrocodone-acetaminophen, hydrocodone-acetaminophen, hydrocodone-acetaminophen, hydrocodone-acetaminophen, ibuprofen, ipratropium-albuterol, levothyroxine, oxygen-helium, potassium chloride sa, ranitidine, sertraline, torsemide, and ventolin hfa. Her primarily concern today is the No chief complaint on file.  Initial Vital Signs:  Pulse/HCG Rate:    Temp:   Resp:   BP:   SpO2:    BMI: Estimated body mass index is 44.63 kg/m as calculated from the following:   Height as of 10/29/18: _0  (1.575 m).   Weight as of 10/29/18: 244 lb (110.7 kg).  Risk Assessment: Allergies: Reviewed. She is allergic to codeine and meloxicam.  Allergy Precautions: None required Coagulopathies: Reviewed. None identified.  Blood-thinner therapy: None at this time Active Infection(s): Reviewed. None identified. Theresa [name] is afebrile  Site Confirmation: Theresa [name] was asked to confirm the procedure and laterality before marking the site Procedure checklist: Completed Consent: Before the procedure and under the influence of no sedative(s), amnesic(s), or anxiolytics, the patient was informed of the treatment options, risks and possible complications. To fulfill our ethical and legal obligations, as recommended by the American Medical Association's Code of Ethics, I have informed the patient of my clinical impression; the nature and purpose of the treatment or procedure; the risks, benefits, and possible complications of the intervention; the alternatives, including doing nothing; the risk(s) and benefit(s) of the alternative treatment(s) or procedure(s); and the risk(s) and benefit(s) of doing nothing. The patient was provided information about the general risks and possible complications associated with the procedure. These may include, but are not limited to: failure to achieve desired goals, infection,  bleeding, organ or nerve damage, allergic reactions, paralysis, and death. In addition, the patient was informed of those risks and complications associated to Spine-related procedures, such as failure to decrease pain;  infection (i.e.: Meningitis, epidural or intraspinal abscess); bleeding (i.e.: epidural hematoma, subarachnoid hemorrhage, or any other type of intraspinal or peri-dural bleeding); organ or nerve damage (i.e.: Any type of peripheral nerve, nerve root, or spinal cord injury) with subsequent damage to sensory, motor, and/or autonomic systems, resulting in permanent pain, numbness, and/or weakness of one or several areas of the body; allergic reactions; (i.e.: anaphylactic reaction); and/or death. Furthermore, the patient was informed of those risks and complications associated with the medications. These include, but are not limited to: allergic reactions (i.e.: anaphylactic or anaphylactoid reaction(s)); adrenal axis suppression; blood sugar elevation that in diabetics may result in ketoacidosis or comma; water retention that in patients with history of congestive heart failure may result in shortness of breath, pulmonary edema, and decompensation with resultant heart failure; weight gain; swelling or edema; medication-induced neural toxicity; particulate matter embolism and blood vessel occlusion with resultant organ, and/or nervous system infarction; and/or aseptic necrosis of one or more joints. Finally, the patient was informed that Medicine is not an exact science; therefore, there is also the possibility of unforeseen or unpredictable risks and/or possible complications that may result in a catastrophic outcome. The patient indicated having understood very clearly. We have given the patient no guarantees and we have made no promises. Enough time was given to the patient to ask questions, all of which were answered to the patient's satisfaction. Theresa [name] has indicated that she wanted to  continue with the procedure. Attestation: I, the ordering provider, attest that I have discussed with the patient the benefits, risks, side-effects, alternatives, likelihood of achieving goals, and potential problems during recovery for the procedure that I have provided informed consent. Date  Time: {CHL [facility]-PAIN TIME CHOICES:[id number]}  Pre-Procedure Preparation:  Monitoring: As per clinic protocol. Respiration, ETCO2, SpO2, BP, heart rate and rhythm monitor placed and checked for adequate function Safety Precautions: Patient was assessed for positional comfort and pressure points before starting the procedure. Time-out: I initiated and conducted the "Time-out" before starting the procedure, as per protocol. The patient was asked to participate by confirming the accuracy of the "Time Out" information. Verification of the correct person, site, and procedure were performed and confirmed by me, the nursing staff, and the patient. "Time-out" conducted as per Joint Commission's Universal Protocol (UP.01.01.01). Time:    Description of Procedure:          Laterality: Bilateral. The procedure was performed in identical fashion on both sides. Levels:  L2, L3, L4, L5, & S1 Medial Branch Level(s) Area Prepped: Posterior Lumbosacral Region Prepping solution: ChloraPrep (2% chlorhexidine gluconate and 70% isopropyl alcohol) Safety Precautions: Aspiration looking for blood return was conducted prior to all injections. At no point did we inject any substances, as a needle was being advanced. Before injecting, the patient was told to immediately notify me if she was experiencing any new onset of "ringing in the ears, or metallic taste in the mouth". No attempts were made at seeking any paresthesias. Safe injection practices and needle disposal techniques used. Medications properly checked for expiration dates. SDV (single dose vial) medications used. After the completion of the procedure, all disposable equipment  used was discarded in the proper designated medical waste containers. Local Anesthesia: Protocol guidelines were followed. The patient was positioned over the fluoroscopy table. The area was prepped in the usual manner. The time-out was completed. The target area was identified using fluoroscopy. A 12-in long, straight, sterile hemostat was used with fluoroscopic guidance to locate the targets for each level  blocked. Once located, the skin was marked with an approved surgical skin marker. Once all sites were marked, the skin (epidermis, dermis, and hypodermis), as well as deeper tissues (fat, connective tissue and muscle) were infiltrated with a small amount of a short-acting local anesthetic, loaded on a 10cc syringe with a 25G, 1.5-in  Needle. An appropriate amount of time was allowed for local anesthetics to take effect before proceeding to the next step. Local Anesthetic: Lidocaine 2.0% The unused portion of the local anesthetic was discarded in the proper designated containers. Technical explanation of process:  L2 Medial Branch Nerve Block (MBB): The target area for the L2 medial branch is at the junction of the postero-lateral aspect of the superior articular process and the superior, posterior, and medial edge of the transverse process of L3. Under fluoroscopic guidance, a Quincke needle was inserted until contact was made with os over the superior postero-lateral aspect of the pedicular shadow (target area). After negative aspiration for blood, 0.5 mL of the nerve block solution was injected without difficulty or complication. The needle was removed intact. L3 Medial Branch Nerve Block (MBB): The target area for the L3 medial branch is at the junction of the postero-lateral aspect of the superior articular process and the superior, posterior, and medial edge of the transverse process of L4. Under fluoroscopic guidance, a Quincke needle was inserted until contact was made with os over the superior  postero-lateral aspect of the pedicular shadow (target area). After negative aspiration for blood, 0.5 mL of the nerve block solution was injected without difficulty or complication. The needle was removed intact. L4 Medial Branch Nerve Block (MBB): The target area for the L4 medial branch is at the junction of the postero-lateral aspect of the superior articular process and the superior, posterior, and medial edge of the transverse process of L5. Under fluoroscopic guidance, a Quincke needle was inserted until contact was made with os over the superior postero-lateral aspect of the pedicular shadow (target area). After negative aspiration for blood, 0.5 mL of the nerve block solution was injected without difficulty or complication. The needle was removed intact. L5 Medial Branch Nerve Block (MBB): The target area for the L5 medial branch is at the junction of the postero-lateral aspect of the superior articular process and the superior, posterior, and medial edge of the sacral ala. Under fluoroscopic guidance, a Quincke needle was inserted until contact was made with os over the superior postero-lateral aspect of the pedicular shadow (target area). After negative aspiration for blood, 0.5 mL of the nerve block solution was injected without difficulty or complication. The needle was removed intact. S1 Medial Branch Nerve Block (MBB): The target area for the S1 medial branch is at the posterior and inferior 6 o'clock position of the L5-S1 facet joint. Under fluoroscopic guidance, the Quincke needle inserted for the L5 MBB was redirected until contact was made with os over the inferior and postero aspect of the sacrum, at the 6 o' clock position under the L5-S1 facet joint (Target area). After negative aspiration for blood, 0.5 mL of the nerve block solution was injected without difficulty or complication. The needle was removed intact.  Nerve block solution: 0.2% PF-Ropivacaine + Triamcinolone (40 mg/mL) diluted  to a final concentration of 4 mg of Triamcinolone/mL of Ropivacaine The unused portion of the solution was discarded in the proper designated containers. Procedural Needles: 22-gauge, 3.5-inch, Quincke needles used for all levels.  Once the entire procedure was completed, the treated area was cleaned, making  sure to leave some of the prepping solution back to take advantage of its long term bactericidal properties.   Illustration of the posterior view of the lumbar spine and the posterior neural structures. Laminae of L2 through S1 are labeled. DPRL5, dorsal primary ramus of L5; DPRS1, dorsal primary ramus of S1; DPR3, dorsal primary ramus of L3; FJ, facet (zygapophyseal) joint L3-L4; I, inferior articular process of L4; LB1, lateral branch of dorsal primary ramus of L1; IAB, inferior articular branches from L3 medial branch (supplies L4-L5 facet joint); IBP, intermediate branch plexus; MB3, medial branch of dorsal primary ramus of L3; NR3, third lumbar nerve root; S, superior articular process of L5; SAB, superior articular branches from L4 (supplies L4-5 facet joint also); TP3, transverse process of L3.  There were no vitals filed for this visit.   Start Time:   hrs. End Time:   hrs.  Imaging Guidance (Spinal):          Type of Imaging Technique: Fluoroscopy Guidance (Spinal) Indication(s): Assistance in needle guidance and placement for procedures requiring needle placement in or near specific anatomical locations not easily accessible without such assistance. Exposure Time: Please see nurses notes. Contrast: None used. Fluoroscopic Guidance: I was personally present during the use of fluoroscopy. "Tunnel Vision Technique" used to obtain the best possible view of the target area. Parallax error corrected before commencing the procedure. "Direction-depth-direction" technique used to introduce the needle under continuous pulsed fluoroscopy. Once target was reached, antero-posterior, oblique, and  lateral fluoroscopic projection used confirm needle placement in all planes. Images permanently stored in EMR. Interpretation: No contrast injected. I personally interpreted the imaging intraoperatively. Adequate needle placement confirmed in multiple planes. Permanent images saved into the patient's record.  Antibiotic Prophylaxis:   Anti-infectives (From admission, onward)   None     Indication(s): None identified  Post-operative Assessment:  Post-procedure Vital Signs:  Pulse/HCG Rate:    Temp:   Resp:   BP:   SpO2:    EBL: None  Complications: No immediate post-treatment complications observed by team, or reported by patient.  Note: The patient tolerated the entire procedure well. A repeat set of vitals were taken after the procedure and the patient was kept under observation following institutional policy, for this type of procedure. Post-procedural neurological assessment was performed, showing return to baseline, prior to discharge. The patient was provided with post-procedure discharge instructions, including a section on how to identify potential problems. Should any problems arise concerning this procedure, the patient was given instructions to immediately contact us, at any time, without hesitation. In any case, we plan to contact the patient by telephone for a follow-up status report regarding this interventional procedure.  Comments:  No additional relevant information.  Plan of Care   Imaging Orders  No imaging studies ordered today   Procedure Orders    No procedure(s) ordered today    Medications ordered for procedure: No orders of the defined types were placed in this encounter.  Medications administered: [name] had no medications administered during this visit.  See the medical record for exact dosing, route, and time of administration.  Disposition: Discharge home  Discharge Date & Time: 11/20/2018;   hrs.   Physician-requested Follow-up: No  follow-ups on file.  Future Appointments  Date Time Provider [place]  11/20/2018 12:45 PM Milinda Pointer, MD [facility] None  01/21/2019  2:20 PM [facility] HEALTH ADVISOR [facility] None  01/30/2019 10:30 AM Vevelyn Francois, NP [facility] None  04/15/2019 10:00 AM Jeannie Fend  Jeneen Rinks, MD [place] None  04/17/2019 10:40 AM [name], FNP [facility] None   Primary Care Physician: Theresa Common, PA Location: [facility] Note by: Theresa Cola, MD Date: 11/20/2018; Time: 8:11 AM  Disclaimer:  Medicine is not an Chief Strategy Officer. The only guarantee in medicine is that nothing is guaranteed. It is important to note that the decision to proceed with this intervention was based on the information collected from the patient. The Data and conclusions were drawn from the patient's questionnaire, the interview, and the physical examination. Because the information was provided in large part by the patient, it cannot be guaranteed that it has not been purposely or unconsciously manipulated. Every effort has been made to obtain as much relevant data as possible for this evaluation. It is important to note that the conclusions that lead to this procedure are derived in large part from the available data. Always take into account that the treatment will also be dependent on availability of resources and existing treatment guidelines, considered by other Pain Management Practitioners as being [name] knowledge and practice, at the time of the intervention. For Medico-Legal purposes, it is also important to point out that variation in procedural techniques and pharmacological choices are the acceptable norm. The indications, contraindications, technique, and results of the above procedure should only be interpreted and judged by a Board-Certified Interventional Pain Specialist with extensive familiarity and expertise in the same exact procedure and technique.

## 2018-11-20 NOTE — Telephone Encounter (Signed)
Patient was notified of results. Expressed understanding. Rx sent to pharmacy.

## 2018-11-20 NOTE — Telephone Encounter (Signed)
-----  Message from Margo Common, [place] sent at 11/19/2018  6:11 PM EST ----- All blood tests essentially normal except slight increase in WBC count. No signs of anemia, electrolyte imbalance or drop in blood sugar. Recommend antibiotic for any lung infection starting to cause fatigue problems (Doxycycline 100 mg BID #20) and recheck CBC with diff in 2 weeks.

## 2018-11-22 DIAGNOSIS — J9610 Chronic respiratory failure, unspecified whether with hypoxia or hypercapnia: Secondary | ICD-10-CM | POA: Diagnosis not present

## 2018-11-26 ENCOUNTER — Encounter: Payer: Self-pay | Admitting: Family Medicine

## 2018-11-26 ENCOUNTER — Ambulatory Visit (INDEPENDENT_AMBULATORY_CARE_PROVIDER_SITE_OTHER): Payer: Medicare HMO | Admitting: Family Medicine

## 2018-11-26 VITALS — BP 120/60 | HR 81 | Temp 98.5°F | Resp 18 | Wt 246.0 lb

## 2018-11-26 DIAGNOSIS — Z9981 Dependence on supplemental oxygen: Secondary | ICD-10-CM

## 2018-11-26 DIAGNOSIS — J441 Chronic obstructive pulmonary disease with (acute) exacerbation: Secondary | ICD-10-CM

## 2018-11-26 MED ORDER — IPRATROPIUM-ALBUTEROL 0.5-2.5 (3) MG/3ML IN SOLN
3.0000 mL | Freq: Once | RESPIRATORY_TRACT | Status: AC
  Administered 2018-11-26: 3 mL via RESPIRATORY_TRACT

## 2018-11-26 MED ORDER — METHYLPREDNISOLONE ACETATE 40 MG/ML IJ SUSP
40.0000 mg | Freq: Once | INTRAMUSCULAR | Status: AC
  Administered 2018-11-26: 40 mg via INTRAMUSCULAR

## 2018-11-26 NOTE — Progress Notes (Signed)
Patient: Theresa [name] Female    DOB: 07-05-1956   63 y.o.   MRN: [id number] Visit Date: 11/26/2018  Today's Provider: [name], PA   Chief Complaint  Patient presents with  . Cough   Subjective:   Cough  This is a new problem. The current episode started in the past 7 days (3 days). The cough is productive of sputum. Associated symptoms include headaches, myalgias, nasal congestion, rhinorrhea, a sore throat, shortness of breath and wheezing. Pertinent negatives include no chest pain, chills, ear congestion, ear pain, fever, heartburn, hemoptysis, postnasal drip, rash, sweats or weight loss.   Patient has had productive cough and sore throat for 3 days. Patient also has symptoms of low grade fever, headaches, body aches, nasal congestion, runny nose, shortness of breath, wheezing, and chest tightness. Patient is taking otc coricidin and prescribed Doxycycline with no relief.  Past Medical History:  Diagnosis Date  . Acute postoperative pain 10/03/2017  . Anxiety   . BiPAP (biphasic positive airway pressure) dependence    at hs  . Carpal tunnel syndrome   . CHF (congestive heart failure) ([place])    1/18  . CHF (congestive heart failure) ([place])   . Chronic generalized abdominal pain   . Chronic rhinitis   . COPD (chronic obstructive pulmonary disease) ([place])   . DDD (degenerative disc disease), cervical   . DDD (degenerative disc disease), lumbosacral   . Depression   . Dyspnea   . Edema   . Flu    1/18  . Gastritis   . GERD (gastroesophageal reflux disease)   . Hematuria   . Hernia of abdominal wall 07/17/2018  . Hernia, abdominal   . Hypertension   . Kidney stones   . Low back pain   . Lumbar radiculitis   . Malodorous urine   . Muscle weakness   . Obesity   . Oxygen dependent   . Renal cyst   . Sensory urge incontinence   . Thyroid activity decreased    9/19  . Tobacco abuse   . Wheezing    Past Surgical History:  Procedure Laterality Date  .  ABDOMINAL HYSTERECTOMY    . CARPAL TUNNEL RELEASE Left 2012  . EPIGASTRIC HERNIA REPAIR N/A 08/13/2018   HERNIA REPAIR EPIGASTRIC ADULT; polypropylene mesh reinforcement.  Surgeon: Robert Bellow, MD;  Location: [facility] ORS;  Service: General;  Laterality: N/A;  . TONSILLECTOMY    . TUBAL LIGATION    . UMBILICAL HERNIA REPAIR N/A 08/13/2018   HERNIA REPAIR UMBILICAL ADULT; polypropylene mesh reinforcement Surgeon: Robert Bellow, MD;  Location: [facility] OR[facility];  Service: General;  Laterality: N/A;   Family History  Problem Relation Age of Onset  . Heart disease Mother   . Stroke Mother   . Coronary artery disease Mother   . Lung cancer Sister   . Cancer Sister   . Breast cancer Sister   . Alcohol abuse Father   . Heart disease Father   . Cancer Brother   . Cancer Brother   . Pneumonia Brother   . Prostate cancer Neg Hx   . Kidney cancer Neg Hx   . Bladder Cancer Neg Hx    Allergies  Allergen Reactions  . Codeine Nausea And Vomiting  . Meloxicam Other (See Comments)    Stomach pain    Current Outpatient Medications:  .  ALPRAZolam (XANAX) 0.5 MG tablet, Take 1 tablet (0.5 mg total) by mouth 2 (two) times daily as  needed for anxiety., Disp: 60 tablet, Rfl: 0 .  aspirin 81 MG tablet, Take 81 mg by mouth daily., Disp: , Rfl:  .  bisoprolol-hydrochlorothiazide (ZIAC) 10-6.25 MG tablet, Take 1 tablet by mouth daily., Disp: 90 tablet, Rfl: 0 .  Cholecalciferol ([employer] D3) 2000 units CAPS, Take 2,000 Units by mouth daily. , Disp: , Rfl:  .  doxycycline (VIBRA-TABS) 100 MG tablet, Take 1 tablet (100 mg total) by mouth 2 (two) times daily., Disp: 20 tablet, Rfl: 0 .  Fluticasone-Umeclidin-Vilant 100-62.5-25 MCG/INH AEPB, Inhale 1 puff into the lungs daily., Disp: , Rfl:  .  gabapentin (NEURONTIN) 300 MG capsule, Take 1 capsule (300 mg total) by mouth 4 (four) times daily., Disp: 120 capsule, Rfl: 3 .  HYDROcodone-acetaminophen (NORCO/VICODIN) 5-325 MG tablet, Take 1 tablet by mouth every 4  (four) hours as needed for moderate pain., Disp: 20 tablet, Rfl: 0 .  [START ON 01/18/2019] HYDROcodone-acetaminophen (NORCO/VICODIN) 5-325 MG tablet, Take 1 tablet by mouth every 8 (eight) hours as needed for up to 30 days for severe pain., Disp: 90 tablet, Rfl: 0 .  [START ON 12/19/2018] HYDROcodone-acetaminophen (NORCO/VICODIN) 5-325 MG tablet, Take 1 tablet by mouth every 8 (eight) hours as needed for up to 30 days for severe pain., Disp: 90 tablet, Rfl: 0 .  HYDROcodone-acetaminophen (NORCO/VICODIN) 5-325 MG tablet, Take 1 tablet by mouth every 8 (eight) hours as needed for up to 30 days for severe pain., Disp: 90 tablet, Rfl: 0 .  ibuprofen (ADVIL,MOTRIN) 200 MG tablet, Take 1,200 mg by mouth every 6 (six) hours as needed for headache or moderate pain., Disp: , Rfl:  .  ipratropium-albuterol (DUONEB) 0.5-2.5 (3) MG/3ML SOLN, Take 3 mLs by nebulization every 6 (six) hours as needed (for shortness of breath or wheezing)., Disp: , Rfl:  .  levothyroxine (SYNTHROID, LEVOTHROID) 25 MCG tablet, Take 25 mcg by mouth daily before breakfast. DUPLICATE ENTRY, Disp: , Rfl:  .  OXYGEN, Inhale 2.5 L into the lungs continuous. Reported on 11/03/2015, Disp: , Rfl:  .  potassium chloride SA (K-DUR,KLOR-CON) 20 MEQ tablet, Take 1 tablet (20 mEq total) by mouth 2 (two) times daily., Disp: 180 tablet, Rfl: 3 .  ranitidine (ZANTAC) 150 MG tablet, Take 150 mg by mouth 2 (two) times daily., Disp: , Rfl:  .  sertraline (ZOLOFT) 100 MG tablet, TAKE 2 TABLETS(200 MG) BY MOUTH EVERY MORNING, Disp: 180 tablet, Rfl: 3 .  VENTOLIN HFA 108 (90 Base) MCG/ACT inhaler, Inhale 2 puffs into the lungs every 6 (six) hours as needed for shortness of breath., Disp: 1 Inhaler, Rfl: 5 .  torsemide (DEMADEX) 20 MG tablet, Take 2 tablets (40 mg total) by mouth 2 (two) times daily., Disp: 120 tablet, Rfl: 3  Review of Systems  Constitutional: Negative for appetite change, chills, fatigue, fever and weight loss.  HENT: Positive for congestion,  rhinorrhea and sore throat. Negative for ear pain, postnasal drip, sinus pressure and sinus pain.   Respiratory: Positive for cough, chest tightness, shortness of breath and wheezing. Negative for hemoptysis.   Cardiovascular: Negative for chest pain and palpitations.  Gastrointestinal: Negative for abdominal pain, heartburn, nausea and vomiting.  Musculoskeletal: Positive for myalgias.  Skin: Negative for rash.  Neurological: Positive for headaches. Negative for dizziness and weakness.   Social History   Tobacco Use  . Smoking status: Current Every Day Smoker    Packs/day: 1.00    Years: 44.00    Pack years: 44.00    Types: Cigarettes  . Smokeless tobacco:  Never Used  Substance Use Topics  . Alcohol use: No    Frequency: Never     Objective:   BP 120/60 (BP Location: Right Arm, Patient Position: Sitting, Cuff Size: Large)   Pulse 81   Temp 98.5 F (36.9 C) (Oral)   Resp 18   Wt 246 lb (111.6 kg)   SpO2 93%   BMI 44.99 kg/m  Vitals:   11/26/18 1336  BP: 120/60  Pulse: 81  Resp: 18  Temp: 98.5 F (36.9 C)  TempSrc: Oral  SpO2: 93%  Weight: 246 lb (111.6 kg)   Physical Exam Constitutional:      General: She is not in acute distress.    Appearance: She is well-developed. She is ill-appearing.  HENT:     Head: Normocephalic and atraumatic.     Right Ear: Hearing and tympanic membrane normal.     Left Ear: Hearing and tympanic membrane normal.     Nose: Nose normal.     Mouth/Throat:     Pharynx: Oropharynx is clear. Posterior oropharyngeal erythema present.     Comments: Slight erythema without exudates around tonsillar pillars. Eyes:     General: Lids are normal. No scleral icterus.       Right eye: No discharge.        Left eye: No discharge.     Conjunctiva/sclera: Conjunctivae normal.  Pulmonary:     Effort: Pulmonary effort is normal. No respiratory distress.     Breath sounds: Wheezing present.     Comments: Short of breath with wheezing. No  rales. Abdominal:     General: Bowel sounds are normal.  Musculoskeletal: Normal range of motion.  Skin:    Findings: No lesion or rash.  Neurological:     Mental Status: She is alert and oriented to person, place, and time.  Psychiatric:        Speech: Speech normal.        Behavior: Behavior normal.        Thought Content: Thought content normal.       Assessment & Plan    1. COPD with acute exacerbation ([place]) Wheezing more and feeling she has had a low grade fever (temp 99 at home) with some sore throat. Coughing more with very little sputum production. Continues to use Trelegy 1 inhalation qd with Ventolin-HFA 2 puffs qid prn. Only used the Duoneb by nebulizer once yesterday. After giving 3 ml of Duoneb in the office, she was breathing easier and had a little yellowish sputum production. Given Depo-Medrol 40 mg IM and recommend she use the Duoneb TID daily for the next 3 days. Should finish the Doxycycline 100 mg BID and recheck CBC in 3-4 days. No rales or rhonchi after nebulizer treatment and wheezing improved, in the office today. Pulse oximetry 93% on 2.5 LPM oxygen.. - ipratropium-albuterol (DUONEB) 0.5-2.5 (3) MG/3ML nebulizer solution 3 mL - methylPREDNISolone acetate (DEPO-MEDROL) injection 40 mg  2. Supplemental oxygen dependent Continues to use oxygen by nasal cannula at 2.5 LPM 24 hours a day. History of PAH and NYHA Class III CHF, followed by Dr. Clayborn Bigness (cardiologist), Darylene Price, FNP ([facility]) and Dr. Raul Del (pulmonologist). No edema today.     [name], PA  [facility]

## 2018-11-29 DIAGNOSIS — J441 Chronic obstructive pulmonary disease with (acute) exacerbation: Secondary | ICD-10-CM | POA: Diagnosis not present

## 2018-11-30 ENCOUNTER — Other Ambulatory Visit: Payer: Self-pay | Admitting: Family Medicine

## 2018-11-30 ENCOUNTER — Telehealth: Payer: Self-pay | Admitting: *Deleted

## 2018-11-30 ENCOUNTER — Telehealth: Payer: Self-pay | Admitting: Family Medicine

## 2018-11-30 LAB — CBC WITH DIFFERENTIAL/PLATELET
Basophils Absolute: 0.1 10*3/uL (ref 0.0–0.2)
Basos: 1 %
EOS (ABSOLUTE): 0.2 10*3/uL (ref 0.0–0.4)
Eos: 2 %
Hematocrit: 46.5 % (ref 34.0–46.6)
Hemoglobin: 14.9 g/dL (ref 11.1–15.9)
Immature Grans (Abs): 0 10*3/uL (ref 0.0–0.1)
Immature Granulocytes: 1 %
Lymphocytes Absolute: 1.6 10*3/uL (ref 0.7–3.1)
Lymphs: 18 %
MCH: 30.5 pg (ref 26.6–33.0)
MCHC: 32 g/dL (ref 31.5–35.7)
MCV: 95 fL (ref 79–97)
Monocytes Absolute: 0.6 10*3/uL (ref 0.1–0.9)
Monocytes: 7 %
Neutrophils Absolute: 6.2 10*3/uL (ref 1.4–7.0)
Neutrophils: 71 %
Platelets: 167 10*3/uL (ref 150–450)
RBC: 4.88 x10E6/uL (ref 3.77–5.28)
RDW: 15.9 % — ABNORMAL HIGH (ref 11.7–15.4)
WBC: 8.6 10*3/uL (ref 3.4–10.8)

## 2018-11-30 MED ORDER — IPRATROPIUM-ALBUTEROL 0.5-2.5 (3) MG/3ML IN SOLN: 3.0000 mL | Freq: Four times a day (QID) | RESPIRATORY_TRACT | 1 refills | Status: DC | PRN

## 2018-11-30 NOTE — Telephone Encounter (Signed)
Patient is requesting a refill on the following medication.  ipratropium-albuterol (DUONEB) 0.5-2.5 (3) MG/3ML SOLN  She does not have enough to last through the weekend.  She uses [facility] in [place].

## 2018-11-30 NOTE — Telephone Encounter (Signed)
Done.

## 2018-11-30 NOTE — Telephone Encounter (Signed)
-----  Message from Margo Common, [place] sent at 11/30/2018  8:15 AM EST ----- Doxycycline brought WBC count back to normal. Regular recheck of lungs in 2-3 months or as needed.

## 2018-11-30 NOTE — Telephone Encounter (Signed)
No answer and no vm. Will try again later.

## 2018-11-30 NOTE — Telephone Encounter (Signed)
Patient advised.

## 2018-12-01 DIAGNOSIS — J9610 Chronic respiratory failure, unspecified whether with hypoxia or hypercapnia: Secondary | ICD-10-CM | POA: Diagnosis not present

## 2018-12-03 NOTE — Telephone Encounter (Signed)
Patient advised and verbally voiced understanding.

## 2018-12-03 NOTE — Telephone Encounter (Signed)
Tried calling patient. Unable to leave a message due to mailbox being full and unable to accept new messages at this time. Will try calling back later.

## 2018-12-04 ENCOUNTER — Ambulatory Visit: Payer: Medicare HMO | Admitting: Pain Medicine

## 2018-12-04 DIAGNOSIS — J9610 Chronic respiratory failure, unspecified whether with hypoxia or hypercapnia: Secondary | ICD-10-CM | POA: Diagnosis not present

## 2018-12-13 ENCOUNTER — Ambulatory Visit (HOSPITAL_BASED_OUTPATIENT_CLINIC_OR_DEPARTMENT_OTHER): Payer: Medicare HMO | Admitting: Pain Medicine

## 2018-12-13 ENCOUNTER — Ambulatory Visit
Admission: RE | Admit: 2018-12-13 | Discharge: 2018-12-13 | Disposition: A | Discharge status: Home / Self Care | Payer: Medicare HMO | Source: Ambulatory Visit | Attending: Pain Medicine | Admitting: Pain Medicine

## 2018-12-13 ENCOUNTER — Encounter: Payer: Self-pay | Admitting: Pain Medicine

## 2018-12-13 ENCOUNTER — Other Ambulatory Visit: Payer: Self-pay

## 2018-12-13 VITALS — BP 112/66 | HR 72 | Temp 98.3°F | Resp 20 | Ht 62.0 in | Wt 240.0 lb

## 2018-12-13 DIAGNOSIS — M47817 Spondylosis without myelopathy or radiculopathy, lumbosacral region: Secondary | ICD-10-CM

## 2018-12-13 DIAGNOSIS — M51369 Other intervertebral disc degeneration, lumbar region without mention of lumbar back pain or lower extremity pain: Secondary | ICD-10-CM

## 2018-12-13 DIAGNOSIS — M545 Low back pain, unspecified: Secondary | ICD-10-CM

## 2018-12-13 DIAGNOSIS — M47816 Spondylosis without myelopathy or radiculopathy, lumbar region: Secondary | ICD-10-CM

## 2018-12-13 DIAGNOSIS — J449 Chronic obstructive pulmonary disease, unspecified: Secondary | ICD-10-CM | POA: Insufficient documentation

## 2018-12-13 DIAGNOSIS — M5136 Other intervertebral disc degeneration, lumbar region: Secondary | ICD-10-CM | POA: Diagnosis not present

## 2018-12-13 DIAGNOSIS — R0902 Hypoxemia: Secondary | ICD-10-CM

## 2018-12-13 DIAGNOSIS — G8929 Other chronic pain: Secondary | ICD-10-CM | POA: Insufficient documentation

## 2018-12-13 MED ORDER — ROPIVACAINE HCL 2 MG/ML IJ SOLN
18.0000 mL | Freq: Once | INTRAMUSCULAR | Status: AC
  Administered 2018-12-13: 18 mL via PERINEURAL
  Filled 2018-12-13: qty 20

## 2018-12-13 MED ORDER — LIDOCAINE HCL 2 % IJ SOLN
20.0000 mL | Freq: Once | INTRAMUSCULAR | Status: AC
  Administered 2018-12-13: 400 mg
  Filled 2018-12-13: qty 40

## 2018-12-13 MED ORDER — FENTANYL CITRATE (PF) 100 MCG/2ML IJ SOLN
25.0000 ug | INTRAMUSCULAR | Status: DC | PRN
  Administered 2018-12-13: 50 ug via INTRAVENOUS
  Filled 2018-12-13: qty 2

## 2018-12-13 MED ORDER — MIDAZOLAM HCL 5 MG/5ML IJ SOLN
1.0000 mg | INTRAMUSCULAR | Status: DC | PRN
  Administered 2018-12-13: 3 mg via INTRAVENOUS
  Filled 2018-12-13: qty 5

## 2018-12-13 MED ORDER — TRIAMCINOLONE ACETONIDE 40 MG/ML IJ SUSP
80.0000 mg | Freq: Once | INTRAMUSCULAR | Status: AC
  Administered 2018-12-13: 80 mg
  Filled 2018-12-13: qty 2

## 2018-12-13 MED ORDER — LACTATED RINGERS IV SOLN
1000.0000 mL | Freq: Once | INTRAVENOUS | Status: AC
  Administered 2018-12-13: 1000 mL via INTRAVENOUS

## 2018-12-13 NOTE — Progress Notes (Signed)
Safety precautions to be maintained throughout the outpatient stay will include: orient to surroundings, keep bed in low position, maintain call bell within reach at all times, provide assistance with transfer out of bed and ambulation.

## 2018-12-13 NOTE — Patient Instructions (Signed)
____________________________________________________________________________________________  Post-Procedure Discharge Instructions  Instructions:  Apply ice:   Purpose: This will minimize any swelling and discomfort after procedure.   When: Day of procedure, as soon as you get home.  How: Fill a plastic sandwich bag with crushed ice. Cover it with a small towel and apply to injection site.  How long: (15 min on, 15 min off) Apply for 15 minutes then remove x 15 minutes.  Repeat sequence on day of procedure, until you go to bed.  Apply heat:   Purpose: To treat any soreness and discomfort from the procedure.  When: Starting the next day after the procedure.  How: Apply heat to procedure site starting the day following the procedure.  How long: May continue to repeat daily, until discomfort goes away.  Food intake: Start with clear liquids (like water) and advance to regular food, as tolerated.   Physical activities: Keep activities to a minimum for the first 8 hours after the procedure. After that, then as tolerated.  Driving: If you have received any sedation, be responsible and do not drive. You are not allowed to drive for 24 hours after having sedation.  Blood thinner: (Applies only to those taking blood thinners) You may restart your blood thinner 6 hours after your procedure.  Insulin: (Applies only to Diabetic patients taking insulin) As soon as you can eat, you may resume your normal dosing schedule.  Infection prevention: Keep procedure site clean and dry. Shower daily and clean area with soap and water.  Post-procedure Pain Diary: Extremely important that this be done correctly and accurately. Recorded information will be used to determine the next step in treatment. For the purpose of accuracy, follow these rules:  Evaluate only the area treated. Do not report or include pain from an untreated area. For the purpose of this evaluation, ignore all other areas of pain,  except for the treated area.  After your procedure, avoid taking a long nap and attempting to complete the pain diary after you wake up. Instead, set your alarm clock to go off every hour, on the hour, for the initial 8 hours after the procedure. Document the duration of the numbing medicine, and the relief you are getting from it.  Do not go to sleep and attempt to complete it later. It will not be accurate. If you received sedation, it is likely that you were given a medication that may cause amnesia. Because of this, completing the diary at a later time may cause the information to be inaccurate. This information is needed to plan your care.  Follow-up appointment: Keep your post-procedure follow-up evaluation appointment after the procedure (usually 2 weeks for most procedures, 6 weeks for radiofrequencies). DO NOT FORGET to bring you pain diary with you.   Expect: (What should I expect to see with my procedure?)  From numbing medicine (AKA: Local Anesthetics): Numbness or decrease in pain. You may also experience some weakness, which if present, could last for the duration of the local anesthetic.  Onset: Full effect within 15 minutes of injected.  Duration: It will depend on the type of local anesthetic used. On the average, 1 to 8 hours.   From steroids (Applies only if steroids were used): Decrease in swelling or inflammation. Once inflammation is improved, relief of the pain will follow.  Onset of benefits: Depends on the amount of swelling present. The more swelling, the longer it will take for the benefits to be seen. In some cases, up to 10 days.  Duration: Steroids will stay in the system x 2 weeks. Duration of benefits will depend on multiple posibilities including persistent irritating factors.  Side-effects: If present, they may typically last 2 weeks (the duration of the steroids).  Frequent: Cramps (if they occur, drink Gatorade and take over-the-counter Magnesium 450-500 mg  once to twice a day); water retention with temporary weight gain; increases in blood sugar; decreased immune system response; increased appetite.  Occasional: Facial flushing (red, warm cheeks); mood swings; menstrual changes.  Uncommon: Long-term decrease or suppression of natural hormones; bone thinning. (These are more common with higher doses or more frequent use. This is why we prefer that our patients avoid having any injection therapies in other practices.)   Very Rare: Severe mood changes; psychosis; aseptic necrosis.  From procedure: Some discomfort is to be expected once the numbing medicine wears off. This should be minimal if ice and heat are applied as instructed.  Call if: (When should I call?)  You experience numbness and weakness that gets worse with time, as opposed to wearing off.  New onset bowel or bladder incontinence. (Applies only to procedures done in the spine)  Emergency Numbers:  Durning business hours (Monday - Thursday, 8:00 AM - 4:00 PM) (Friday, 9:00 AM - 12:00 Noon): [phone]  After hours: [phone]  NOTE: If you are having a problem and are unable connect with, or to talk to a provider, then go to your nearest urgent care or emergency department. If the problem is serious and urgent, please call 911. ____________________________________________________________________________________________

## 2018-12-13 NOTE — Progress Notes (Signed)
Patient's Name: Theresa [name]  MRN: [id number]  Referring Provider: Margo Common, [place]  DOB: 1956-05-15  PCP: Margo Common, PA  DOS: 12/13/2018  Note by: Gaspar Cola, MD  Service setting: Ambulatory outpatient  Specialty: Interventional Pain Management  Patient type: Established  Location: [facility] (AMB) Pain Management Facility  Visit type: Interventional Procedure   Primary Reason for Visit: Interventional Pain Management Treatment. CC: Back Pain (low)  Procedure:          Anesthesia, Analgesia, Anxiolysis:  Type: Lumbar Facet, Medial Branch Block(s) #3  Primary Purpose: Palliative Region: Posterolateral Lumbosacral Spine Level: L2, L3, L4, L5, & S1 Medial Branch Level(s). Injecting these levels blocks the L3-4, L4-5, and L5-S1 lumbar facet joints. Laterality: Bilateral  Type: Moderate (Conscious) Sedation combined with Local Anesthesia Indication(s): Analgesia and Anxiety Route: Intravenous (IV) IV Access: Secured Sedation: Meaningful verbal contact was maintained at all times during the procedure  Local Anesthetic: Lidocaine 1-2%  Position: Prone   Indications: 1. Spondylosis without myelopathy or radiculopathy, lumbosacral region   2. Lumbar facet syndrome (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)   3. Lumbar facet hypertrophy (multilevel)   4. DDD (degenerative disc disease), lumbar   5. Chronic low back pain (Primary Area of Pain) (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)   6. COPD with hypoxia ([facility] [place])    Pain Score: Pre-procedure: 6 /10 Post-procedure: 0-No pain/10  Pre-op Assessment:  Ms. Kyler is a 63 y.o. (year old), female patient, seen today for interventional treatment. She  has a past surgical history that includes Abdominal hysterectomy; Tonsillectomy; Carpal tunnel release (Left, 2012); Tubal ligation; epigastric hernia repair (N/A, 78/67/6720); and Umbilical hernia repair (N/A, 08/13/2018). Ms. Gates has a current medication list which includes the following prescription(s): alprazolam, aspirin,  bisoprolol-hydrochlorothiazide, cholecalciferol, fluticasone-umeclidin-vilant, gabapentin, hydrocodone-acetaminophen, hydrocodone-acetaminophen, hydrocodone-acetaminophen, hydrocodone-acetaminophen, ibuprofen, ipratropium-albuterol, levothyroxine, oxygen-helium, potassium chloride sa, ranitidine, sertraline, doxycycline, torsemide, and ventolin hfa, and the following Facility-Administered Medications: fentanyl and midazolam. Her primarily concern today is the Back Pain (low)  Initial Vital Signs:  Pulse/HCG Rate: 72ECG Heart Rate: 70 Temp: 97.8 F (36.6 C) Resp: 18 BP: 100/70 SpO2: 92 %  BMI: Estimated body mass index is 43.9 kg/m as calculated from the following:   Height as of this encounter: _0  (1.575 m).   Weight as of this encounter: 240 lb (108.9 kg).  Risk Assessment: Allergies: Reviewed. She is allergic to codeine and meloxicam.  Allergy Precautions: None required Coagulopathies: Reviewed. None identified.  Blood-thinner therapy: None at this time Active Infection(s): Reviewed. None identified. Ms. Basden is afebrile  Site Confirmation: Ms. Balon was asked to confirm the procedure and laterality before marking the site Procedure checklist: Completed Consent: Before the procedure and under the influence of no sedative(s), amnesic(s), or anxiolytics, the patient was informed of the treatment options, risks and possible complications. To fulfill our ethical and legal obligations, as recommended by the American Medical Association's Code of Ethics, I have informed the patient of my clinical impression; the nature and purpose of the treatment or procedure; the risks, benefits, and possible complications of the intervention; the alternatives, including doing nothing; the risk(s) and benefit(s) of the alternative treatment(s) or procedure(s); and the risk(s) and benefit(s) of doing nothing. The patient was provided information about the general risks and possible complications  associated with the procedure. These may include, but are not limited to: failure to achieve desired goals, infection, bleeding, organ or nerve damage, allergic reactions, paralysis, and death. In addition, the patient was informed of those risks and complications associated to Spine-related procedures, such  as failure to decrease pain; infection (i.e.: Meningitis, epidural or intraspinal abscess); bleeding (i.e.: epidural hematoma, subarachnoid hemorrhage, or any other type of intraspinal or peri-dural bleeding); organ or nerve damage (i.e.: Any type of peripheral nerve, nerve root, or spinal cord injury) with subsequent damage to sensory, motor, and/or autonomic systems, resulting in permanent pain, numbness, and/or weakness of one or several areas of the body; allergic reactions; (i.e.: anaphylactic reaction); and/or death. Furthermore, the patient was informed of those risks and complications associated with the medications. These include, but are not limited to: allergic reactions (i.e.: anaphylactic or anaphylactoid reaction(s)); adrenal axis suppression; blood sugar elevation that in diabetics may result in ketoacidosis or comma; water retention that in patients with history of congestive heart failure may result in shortness of breath, pulmonary edema, and decompensation with resultant heart failure; weight gain; swelling or edema; medication-induced neural toxicity; particulate matter embolism and blood vessel occlusion with resultant organ, and/or nervous system infarction; and/or aseptic necrosis of one or more joints. Finally, the patient was informed that Medicine is not an exact science; therefore, there is also the possibility of unforeseen or unpredictable risks and/or possible complications that may result in a catastrophic outcome. The patient indicated having understood very clearly. We have given the patient no guarantees and we have made no promises. Enough time was given to the patient to  ask questions, all of which were answered to the patient's satisfaction. Ms. Pyon has indicated that she wanted to continue with the procedure. Attestation: I, the ordering provider, attest that I have discussed with the patient the benefits, risks, side-effects, alternatives, likelihood of achieving goals, and potential problems during recovery for the procedure that I have provided informed consent. Date  Time: 12/13/2018 12:11 PM  Pre-Procedure Preparation:  Monitoring: As per clinic protocol. Respiration, ETCO2, SpO2, BP, heart rate and rhythm monitor placed and checked for adequate function Safety Precautions: Patient was assessed for positional comfort and pressure points before starting the procedure. Time-out: I initiated and conducted the "Time-out" before starting the procedure, as per protocol. The patient was asked to participate by confirming the accuracy of the "Time Out" information. Verification of the correct person, site, and procedure were performed and confirmed by me, the nursing staff, and the patient. "Time-out" conducted as per Joint Commission's Universal Protocol (UP.01.01.01). Time: 1250  Description of Procedure:          Laterality: Bilateral. The procedure was performed in identical fashion on both sides. Levels:  L2, L3, L4, L5, & S1 Medial Branch Level(s) Area Prepped: Posterior Lumbosacral Region Prepping solution: ChloraPrep (2% chlorhexidine gluconate and 70% isopropyl alcohol) Safety Precautions: Aspiration looking for blood return was conducted prior to all injections. At no point did we inject any substances, as a needle was being advanced. Before injecting, the patient was told to immediately notify me if she was experiencing any new onset of "ringing in the ears, or metallic taste in the mouth". No attempts were made at seeking any paresthesias. Safe injection practices and needle disposal techniques used. Medications properly checked for expiration dates.  SDV (single dose vial) medications used. After the completion of the procedure, all disposable equipment used was discarded in the proper designated medical waste containers. Local Anesthesia: Protocol guidelines were followed. The patient was positioned over the fluoroscopy table. The area was prepped in the usual manner. The time-out was completed. The target area was identified using fluoroscopy. A 12-in long, straight, sterile hemostat was used with fluoroscopic guidance to locate the targets  for each level blocked. Once located, the skin was marked with an approved surgical skin marker. Once all sites were marked, the skin (epidermis, dermis, and hypodermis), as well as deeper tissues (fat, connective tissue and muscle) were infiltrated with a small amount of a short-acting local anesthetic, loaded on a 10cc syringe with a 25G, 1.5-in  Needle. An appropriate amount of time was allowed for local anesthetics to take effect before proceeding to the next step. Local Anesthetic: Lidocaine 2.0% The unused portion of the local anesthetic was discarded in the proper designated containers. Technical explanation of process:  L2 Medial Branch Nerve Block (MBB): The target area for the L2 medial branch is at the junction of the postero-lateral aspect of the superior articular process and the superior, posterior, and medial edge of the transverse process of L3. Under fluoroscopic guidance, a Quincke needle was inserted until contact was made with os over the superior postero-lateral aspect of the pedicular shadow (target area). After negative aspiration for blood, 0.5 mL of the nerve block solution was injected without difficulty or complication. The needle was removed intact. L3 Medial Branch Nerve Block (MBB): The target area for the L3 medial branch is at the junction of the postero-lateral aspect of the superior articular process and the superior, posterior, and medial edge of the transverse process of L4. Under  fluoroscopic guidance, a Quincke needle was inserted until contact was made with os over the superior postero-lateral aspect of the pedicular shadow (target area). After negative aspiration for blood, 0.5 mL of the nerve block solution was injected without difficulty or complication. The needle was removed intact. L4 Medial Branch Nerve Block (MBB): The target area for the L4 medial branch is at the junction of the postero-lateral aspect of the superior articular process and the superior, posterior, and medial edge of the transverse process of L5. Under fluoroscopic guidance, a Quincke needle was inserted until contact was made with os over the superior postero-lateral aspect of the pedicular shadow (target area). After negative aspiration for blood, 0.5 mL of the nerve block solution was injected without difficulty or complication. The needle was removed intact. L5 Medial Branch Nerve Block (MBB): The target area for the L5 medial branch is at the junction of the postero-lateral aspect of the superior articular process and the superior, posterior, and medial edge of the sacral ala. Under fluoroscopic guidance, a Quincke needle was inserted until contact was made with os over the superior postero-lateral aspect of the pedicular shadow (target area). After negative aspiration for blood, 0.5 mL of the nerve block solution was injected without difficulty or complication. The needle was removed intact. S1 Medial Branch Nerve Block (MBB): The target area for the S1 medial branch is at the posterior and inferior 6 o'clock position of the L5-S1 facet joint. Under fluoroscopic guidance, the Quincke needle inserted for the L5 MBB was redirected until contact was made with os over the inferior and postero aspect of the sacrum, at the 6 o' clock position under the L5-S1 facet joint (Target area). After negative aspiration for blood, 0.5 mL of the nerve block solution was injected without difficulty or complication. The  needle was removed intact.  Nerve block solution: 0.2% PF-Ropivacaine + Triamcinolone (40 mg/mL) diluted to a final concentration of 4 mg of Triamcinolone/mL of Ropivacaine The unused portion of the solution was discarded in the proper designated containers. Procedural Needles: 22-gauge, 3.5-inch, Quincke needles used for all levels.  Once the entire procedure was completed, the treated area  was cleaned, making sure to leave some of the prepping solution back to take advantage of its long term bactericidal properties.   Illustration of the posterior view of the lumbar spine and the posterior neural structures. Laminae of L2 through S1 are labeled. DPRL5, dorsal primary ramus of L5; DPRS1, dorsal primary ramus of S1; DPR3, dorsal primary ramus of L3; FJ, facet (zygapophyseal) joint L3-L4; I, inferior articular process of L4; LB1, lateral branch of dorsal primary ramus of L1; IAB, inferior articular branches from L3 medial branch (supplies L4-L5 facet joint); IBP, intermediate branch plexus; MB3, medial branch of dorsal primary ramus of L3; NR3, third lumbar nerve root; S, superior articular process of L5; SAB, superior articular branches from L4 (supplies L4-5 facet joint also); TP3, transverse process of L3.  Vitals:   12/13/18 1305 12/13/18 1311 12/13/18 1321 12/13/18 1331  BP: 109/73 (!) 119/57 120/75 112/66  Pulse:      Resp: _0 Temp:  98.5 F (36.9 C)  98.3 F (36.8 C)  TempSrc:  Temporal  Temporal  SpO2: 100% 93% 94% 96%  Weight:      Height:         Start Time: 1250 hrs. End Time: 1305 hrs.  Imaging Guidance (Spinal):          Type of Imaging Technique: Fluoroscopy Guidance (Spinal) Indication(s): Assistance in needle guidance and placement for procedures requiring needle placement in or near specific anatomical locations not easily accessible without such assistance. Exposure Time: Please see nurses notes. Contrast: None used. Fluoroscopic Guidance: I was personally  present during the use of fluoroscopy. "Tunnel Vision Technique" used to obtain the best possible view of the target area. Parallax error corrected before commencing the procedure. "Direction-depth-direction" technique used to introduce the needle under continuous pulsed fluoroscopy. Once target was reached, antero-posterior, oblique, and lateral fluoroscopic projection used confirm needle placement in all planes. Images permanently stored in EMR. Interpretation: No contrast injected. I personally interpreted the imaging intraoperatively. Adequate needle placement confirmed in multiple planes. Permanent images saved into the patient's record.  Antibiotic Prophylaxis:   Anti-infectives (From admission, onward)   None     Indication(s): None identified  Post-operative Assessment:  Post-procedure Vital Signs:  Pulse/HCG Rate: 7271 Temp: 98.3 F (36.8 C) Resp: 20 BP: 112/66 SpO2: 96 %  EBL: None  Complications: No immediate post-treatment complications observed by team, or reported by patient.  Note: The patient tolerated the entire procedure well. A repeat set of vitals were taken after the procedure and the patient was kept under observation following institutional policy, for this type of procedure. Post-procedural neurological assessment was performed, showing return to baseline, prior to discharge. The patient was provided with post-procedure discharge instructions, including a section on how to identify potential problems. Should any problems arise concerning this procedure, the patient was given instructions to immediately contact us, at any time, without hesitation. In any case, we plan to contact the patient by telephone for a follow-up status report regarding this interventional procedure.  Comments:  No additional relevant information.  Plan of Care  Interventional management options: Planned, scheduled, and/or pending:   NOTE: NO Lumbar RFA until BMI <35. No further procedures  planned at this time.   Considering:   Leftlumbar facet RFA #1(done on 06/27/2017) Right lumbar facet RFA #1 (done on 10/03/2017)  TherapeuticleftL4-5 interlaminar LESI + leftL4 TFESI   Palliative PRN treatment(s):   Palliative bilateral lumbar facetblocks Palliative left L4-5 interlaminar LESI + left L4TFESI PalliativerightL4-5  interlaminar LESI+ rightL4 transforaminal LESI    Imaging Orders     DG C-Arm 1-60 Min-No Report  Procedure Orders     LUMBAR FACET(MEDIAL BRANCH NERVE BLOCK) MBNB  Medications ordered for procedure: Meds ordered this encounter  Medications  . lidocaine (XYLOCAINE) 2 % (with pres) injection 400 mg  . midazolam (VERSED) 5 MG/5ML injection 1-2 mg    Make sure Flumazenil is available in the pyxis when using this medication. If oversedation occurs, administer 0.2 mg IV over 15 sec. If after 45 sec no response, administer 0.2 mg again over 1 min; may repeat at 1 min intervals; not to exceed 4 doses (1 mg)  . fentaNYL (SUBLIMAZE) injection 25-50 mcg    Make sure Narcan is available in the pyxis when using this medication. In the event of respiratory depression (RR< 8/min): Titrate NARCAN (naloxone) in increments of 0.1 to 0.2 mg IV at 2-3 minute intervals, until desired degree of reversal.  . lactated ringers infusion 1,000 mL  . ropivacaine (PF) 2 mg/mL (0.2%) (NAROPIN) injection 18 mL  . triamcinolone acetonide (KENALOG-40) injection 80 mg   Medications administered: We administered lidocaine, midazolam, fentaNYL, lactated ringers, ropivacaine (PF) 2 mg/mL (0.2%), and triamcinolone acetonide.  See the medical record for exact dosing, route, and time of administration.  Disposition: Discharge home  Discharge Date & Time: 12/13/2018; 1338 hrs.   Physician-requested Follow-up: Return for PPE (2 wks), w/ [name], NP.  Future Appointments  Date Time Provider [place]  12/27/2018  9:00 AM Vevelyn Francois, NP [facility] None  01/21/2019   2:20 PM [facility]-NURSE HEALTH ADVISOR [facility] None  01/30/2019 10:30 AM Vevelyn Francois, NP [facility] None  04/15/2019 10:00 AM Harlin Heys, MD [facility] None  04/17/2019 10:40 AM [name], FNP [facility] None   Primary Care Physician: Margo Common, PA Location: [facility] Note by: Gaspar Cola, MD Date: 12/13/2018; Time: 2:07 PM  Disclaimer:  Medicine is not an Chief Strategy Officer. The only guarantee in medicine is that nothing is guaranteed. It is important to note that the decision to proceed with this intervention was based on the information collected from the patient. The Data and conclusions were drawn from the patient's questionnaire, the interview, and the physical examination. Because the information was provided in large part by the patient, it cannot be guaranteed that it has not been purposely or unconsciously manipulated. Every effort has been made to obtain as much relevant data as possible for this evaluation. It is important to note that the conclusions that lead to this procedure are derived in large part from the available data. Always take into account that the treatment will also be dependent on availability of resources and existing treatment guidelines, considered by other Pain Management Practitioners as being common knowledge and practice, at the time of the intervention. For Medico-Legal purposes, it is also important to point out that variation in procedural techniques and pharmacological choices are the acceptable norm. The indications, contraindications, technique, and results of the above procedure should only be interpreted and judged by a Board-Certified Interventional Pain Specialist with extensive familiarity and expertise in the same exact procedure and technique.

## 2018-12-14 ENCOUNTER — Telehealth: Payer: Self-pay

## 2018-12-14 NOTE — Telephone Encounter (Signed)
Post procedure phone call.  LM 

## 2018-12-21 DIAGNOSIS — J9610 Chronic respiratory failure, unspecified whether with hypoxia or hypercapnia: Secondary | ICD-10-CM | POA: Diagnosis not present

## 2018-12-27 ENCOUNTER — Ambulatory Visit: Payer: Self-pay | Admitting: Nurse Practitioner

## 2018-12-30 DIAGNOSIS — J9610 Chronic respiratory failure, unspecified whether with hypoxia or hypercapnia: Secondary | ICD-10-CM | POA: Diagnosis not present

## 2019-01-02 DIAGNOSIS — J9610 Chronic respiratory failure, unspecified whether with hypoxia or hypercapnia: Secondary | ICD-10-CM | POA: Diagnosis not present

## 2019-01-03 ENCOUNTER — Ambulatory Visit: Payer: Self-pay | Admitting: Nurse Practitioner

## 2019-01-04 ENCOUNTER — Other Ambulatory Visit: Payer: Self-pay

## 2019-01-04 ENCOUNTER — Encounter: Payer: Self-pay | Admitting: Physician Assistant

## 2019-01-04 ENCOUNTER — Ambulatory Visit (INDEPENDENT_AMBULATORY_CARE_PROVIDER_SITE_OTHER): Payer: Medicare HMO | Admitting: Physician Assistant

## 2019-01-04 VITALS — BP 135/82 | HR 73 | Temp 98.2°F | Resp 16 | Wt 247.0 lb

## 2019-01-04 DIAGNOSIS — J0100 Acute maxillary sinusitis, unspecified: Secondary | ICD-10-CM

## 2019-01-04 DIAGNOSIS — J441 Chronic obstructive pulmonary disease with (acute) exacerbation: Secondary | ICD-10-CM

## 2019-01-04 IMAGING — MG MM DIGITAL SCREENING BILAT W/ TOMO W/ CAD
8 series · 8 of 24 positions shown · non-contrast
Comparison: Previous exam(s).

ACR Breast Density Category a: The breast tissue is almost entirely
fatty.

CLINICAL DATA: Screening.

EXAM:
DIGITAL SCREENING BILATERAL MAMMOGRAM WITH TOMO AND CAD

[L CC synth-2D]
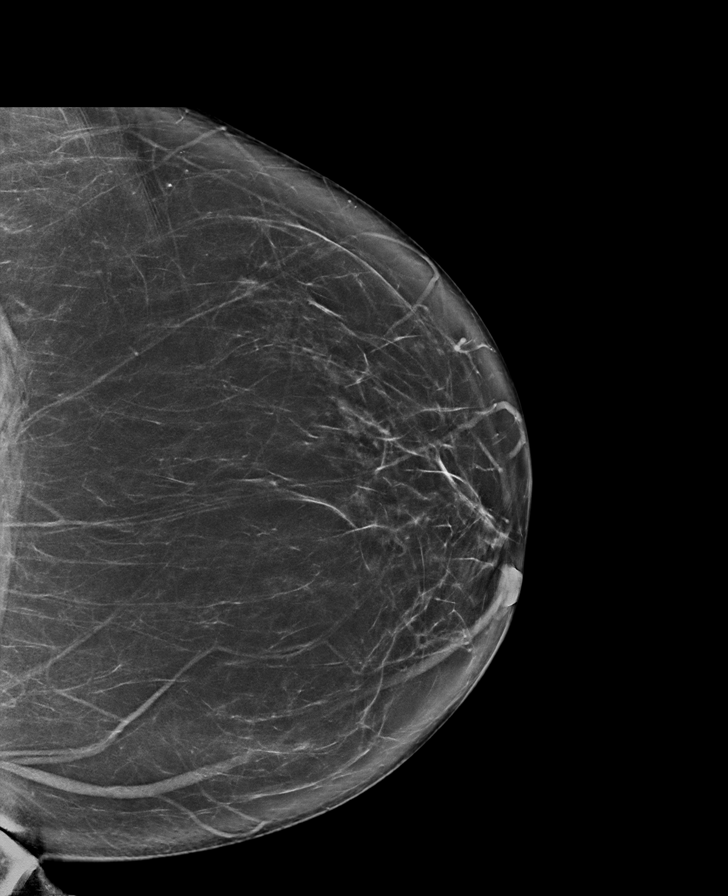

[R CC synth-2D]
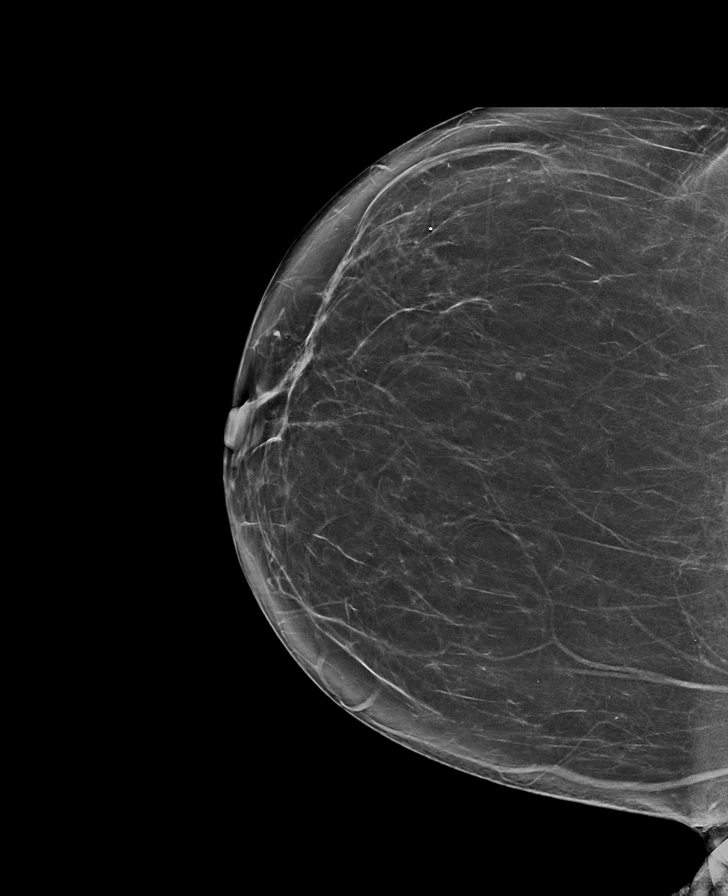

[R MLO synth-2D]
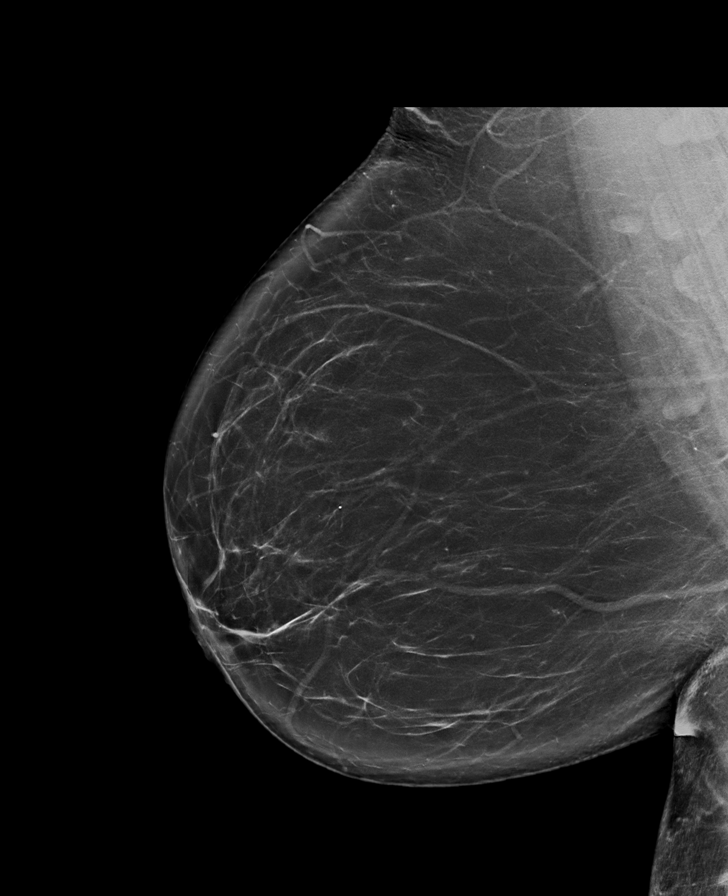

[L MLO synth-2D]
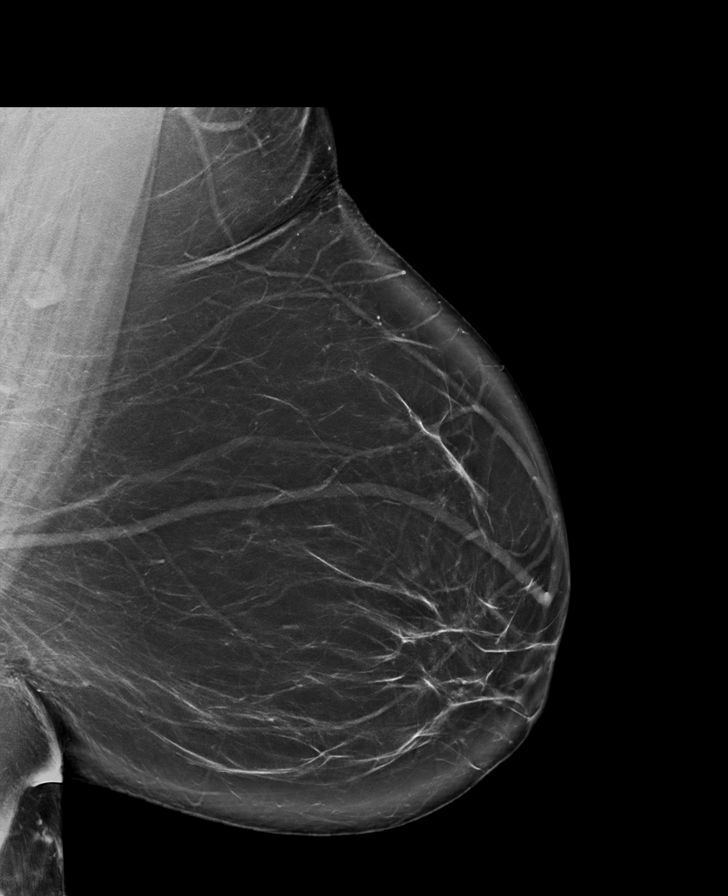

[L CC tomo · tomo slice 43/84.0]
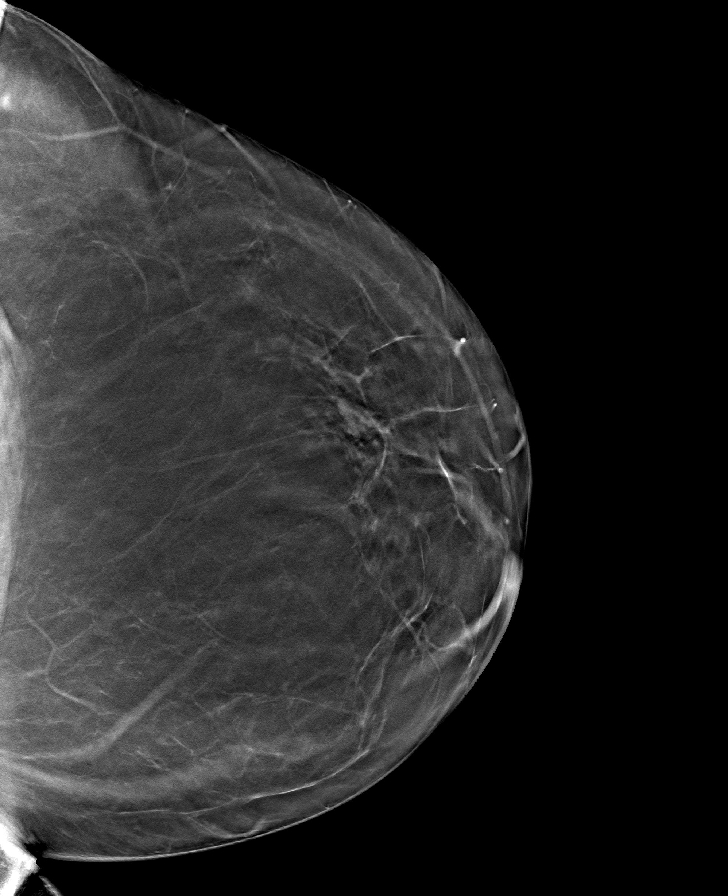

[L MLO tomo · tomo slice 50/99.0]
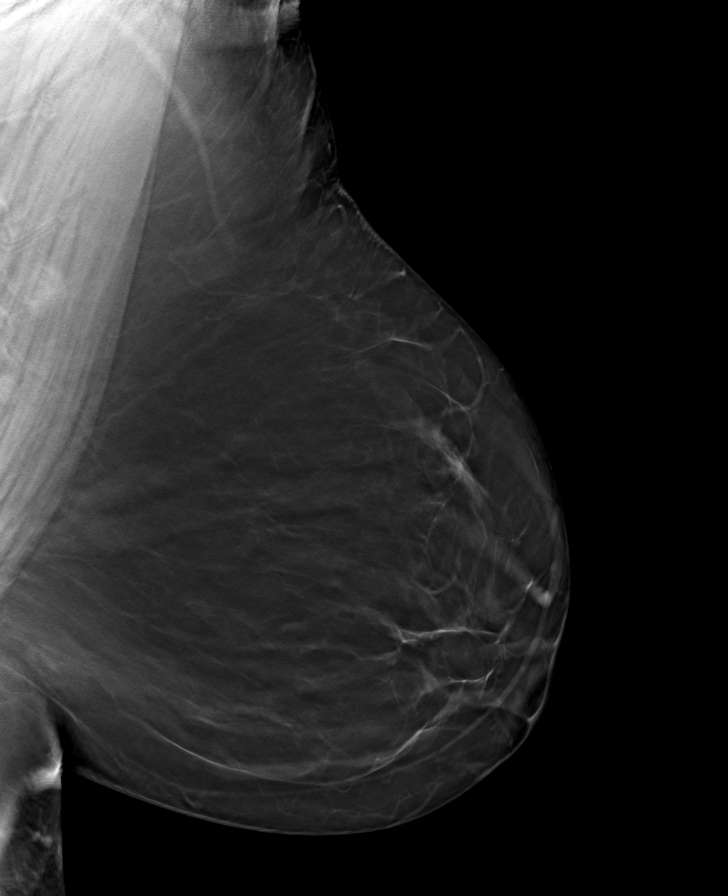

[R MLO tomo · tomo slice 51/100.0]
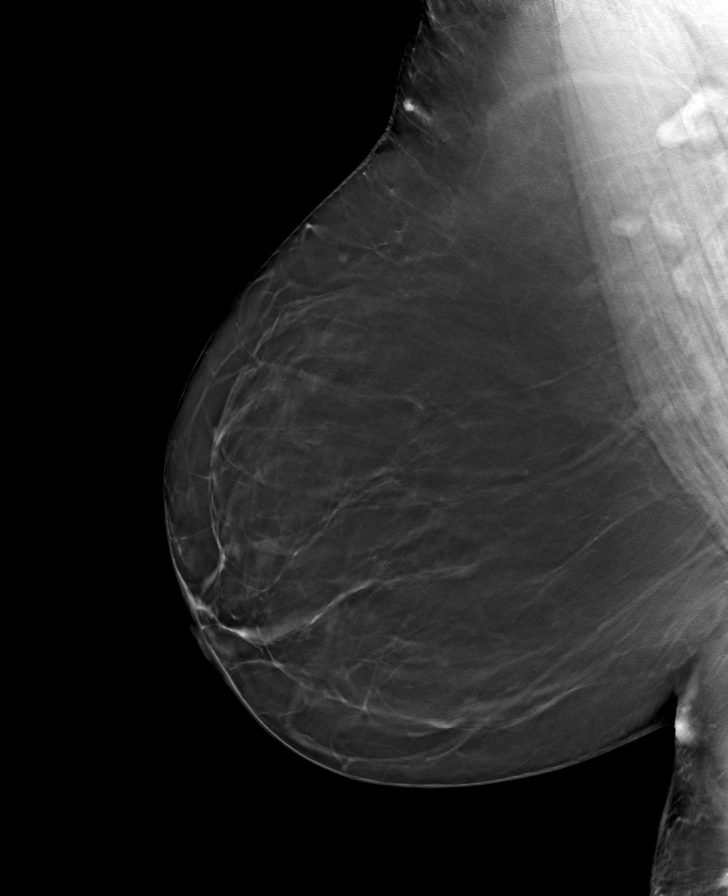

[R CC tomo · tomo slice 42/83.0]
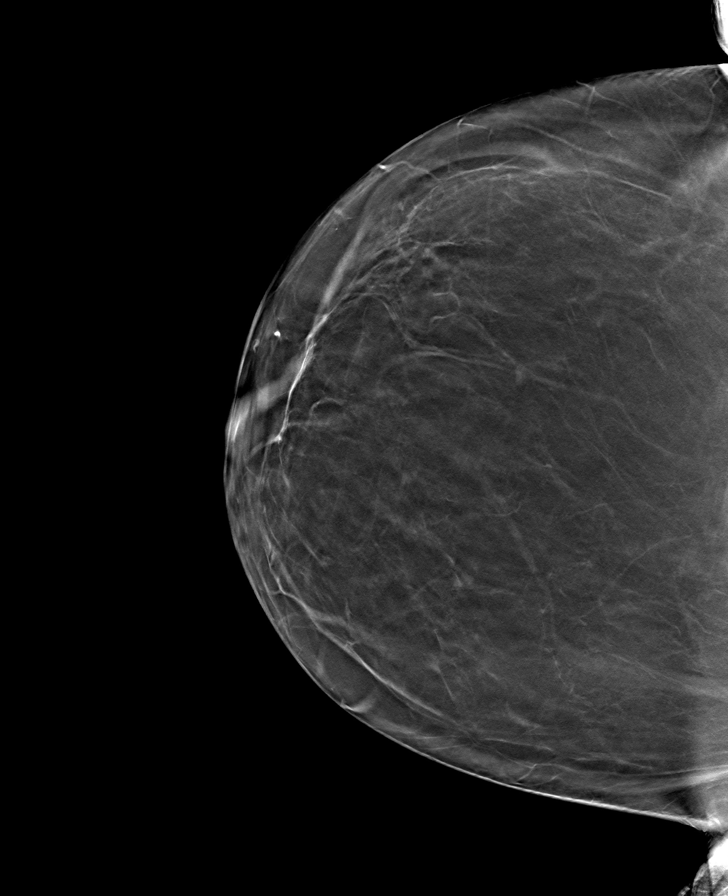

[8 of 24 positions shown; findings below may reference images not displayed]

FINDINGS: There are no findings suspicious for malignancy. Images were
processed with CAD.
IMPRESSION: No mammographic evidence of malignancy. A result letter of this
screening mammogram will be mailed directly to the patient.

RECOMMENDATION:
Screening mammogram in one year. (Code:[id number]-[id number])

BI-RADS CATEGORY  1: Negative.

## 2019-01-04 MED ORDER — DOXYCYCLINE HYCLATE 100 MG PO TABS: 100.0000 mg | ORAL_TABLET | Freq: Two times a day (BID) | ORAL | 0 refills | Status: DC

## 2019-01-04 MED ORDER — FLUTICASONE FUROATE 27.5 MCG/SPRAY NA SUSP: 2.0000 | Freq: Every day | NASAL | 12 refills | Status: DC

## 2019-01-04 MED ORDER — METHYLPREDNISOLONE ACETATE 40 MG/ML IJ SUSP
40.0000 mg | Freq: Once | INTRAMUSCULAR | Status: AC
  Administered 2019-01-04: 40 mg via INTRAMUSCULAR

## 2019-01-04 NOTE — Progress Notes (Signed)
Patient: Theresa [name] Female    DOB: August 08, 1956   63 y.o.   MRN: [id number] Visit Date: 01/04/2019  Today's Provider: Mar Daring, PA-C   Chief Complaint  Patient presents with  . URI   Subjective:     HPI Upper Respiratory Infection: Patient complains of symptoms of a URI. Symptoms include congestion, cough and COPD. Onset of symptoms was 1 week ago, gradually worsening since that time. She also c/o congestion and shortness of breath for the past 3 days .  She is drinking plenty of fluids. Evaluation to date: none. Treatment to date: antihistamines, cough suppressants, decongestants and nasal steroids.    Allergies  Allergen Reactions  . Codeine Nausea And Vomiting  . Meloxicam Other (See Comments)    Stomach pain     Current Outpatient Medications:  .  ALPRAZolam (XANAX) 0.5 MG tablet, Take 1 tablet (0.5 mg total) by mouth 2 (two) times daily as needed for anxiety., Disp: 60 tablet, Rfl: 0 .  aspirin 81 MG tablet, Take 81 mg by mouth daily., Disp: , Rfl:  .  bisoprolol-hydrochlorothiazide (ZIAC) 10-6.25 MG tablet, Take 1 tablet by mouth daily., Disp: 90 tablet, Rfl: 0 .  Cholecalciferol ([employer] D3) 2000 units CAPS, Take 2,000 Units by mouth daily. , Disp: , Rfl:  .  doxycycline (VIBRA-TABS) 100 MG tablet, Take 1 tablet (100 mg total) by mouth 2 (two) times daily., Disp: 20 tablet, Rfl: 0 .  Fluticasone-Umeclidin-Vilant 100-62.5-25 MCG/INH AEPB, Inhale 1 puff into the lungs daily., Disp: , Rfl:  .  gabapentin (NEURONTIN) 300 MG capsule, Take 1 capsule (300 mg total) by mouth 4 (four) times daily., Disp: 120 capsule, Rfl: 3 .  HYDROcodone-acetaminophen (NORCO/VICODIN) 5-325 MG tablet, Take 1 tablet by mouth every 4 (four) hours as needed for moderate pain., Disp: 20 tablet, Rfl: 0 .  [START ON 01/18/2019] HYDROcodone-acetaminophen (NORCO/VICODIN) 5-325 MG tablet, Take 1 tablet by mouth every 8 (eight) hours as needed for up to 30 days for severe pain., Disp: 90 tablet,  Rfl: 0 .  HYDROcodone-acetaminophen (NORCO/VICODIN) 5-325 MG tablet, Take 1 tablet by mouth every 8 (eight) hours as needed for up to 30 days for severe pain., Disp: 90 tablet, Rfl: 0 .  ibuprofen (ADVIL,MOTRIN) 200 MG tablet, Take 1,200 mg by mouth every 6 (six) hours as needed for headache or moderate pain., Disp: , Rfl:  .  ipratropium-albuterol (DUONEB) 0.5-2.5 (3) MG/3ML SOLN, USE 1 VIAL VIA NEBULIZER EVERY 6 HOURS AS NEEDED FOR SHORTNESS OF BREATH OR WHEEZING, Disp: 360 mL, Rfl: 1 .  levothyroxine (SYNTHROID, LEVOTHROID) 25 MCG tablet, Take 25 mcg by mouth daily before breakfast. DUPLICATE ENTRY, Disp: , Rfl:  .  OXYGEN, Inhale 2.5 L into the lungs continuous. Reported on 11/03/2015, Disp: , Rfl:  .  potassium chloride SA (K-DUR,KLOR-CON) 20 MEQ tablet, Take 1 tablet (20 mEq total) by mouth 2 (two) times daily., Disp: 180 tablet, Rfl: 3 .  ranitidine (ZANTAC) 150 MG tablet, Take 150 mg by mouth 2 (two) times daily., Disp: , Rfl:  .  sertraline (ZOLOFT) 100 MG tablet, TAKE 2 TABLETS(200 MG) BY MOUTH EVERY MORNING, Disp: 180 tablet, Rfl: 3 .  torsemide (DEMADEX) 20 MG tablet, Take 2 tablets (40 mg total) by mouth 2 (two) times daily., Disp: 120 tablet, Rfl: 3 .  VENTOLIN HFA 108 (90 Base) MCG/ACT inhaler, Inhale 2 puffs into the lungs every 6 (six) hours as needed for shortness of breath., Disp: 1 Inhaler, Rfl: 5  Current Facility-Administered Medications:  .  methylPREDNISolone acetate (DEPO-MEDROL) injection 40 mg, 40 mg, Intramuscular, Once, Mar Daring, PA-C  Review of Systems  Constitutional: Positive for fatigue. Negative for chills and fever.  HENT: Positive for congestion, postnasal drip, rhinorrhea and sinus pressure. Negative for ear pain, sinus pain, sore throat and trouble swallowing.   Respiratory: Positive for cough, chest tightness, shortness of breath and wheezing.   Cardiovascular: Negative.   Gastrointestinal: Negative.   Neurological: Negative.     Social  History   Tobacco Use  . Smoking status: Current Every Day Smoker    Packs/day: 1.00    Years: 44.00    Pack years: 44.00    Types: Cigarettes  . Smokeless tobacco: Never Used  Substance Use Topics  . Alcohol use: No    Frequency: Never      Objective:   BP 135/82 (BP Location: Left Arm, Patient Position: Sitting, Cuff Size: Normal)   Pulse 73   Temp 98.2 F (36.8 C) (Oral)   Resp 16   Wt 247 lb (112 kg)   SpO2 98% Comment: without oxygen  BMI 45.18 kg/m  Vitals:   01/04/19 1724  BP: 135/82  Pulse: 73  Resp: 16  Temp: 98.2 F (36.8 C)  TempSrc: Oral  SpO2: 98%  Weight: 247 lb (112 kg)     Physical Exam Vitals signs reviewed.  Constitutional:      General: She is not in acute distress.    Appearance: Normal appearance. She is well-developed. She is obese. She is ill-appearing. She is not diaphoretic.  HENT:     Head: Normocephalic and atraumatic.     Right Ear: Hearing, tympanic membrane, ear canal and external ear normal.     Left Ear: Hearing, tympanic membrane, ear canal and external ear normal.     Nose: Congestion and rhinorrhea present.     Right Sinus: Maxillary sinus tenderness and frontal sinus tenderness present.     Left Sinus: Maxillary sinus tenderness and frontal sinus tenderness present.     Mouth/Throat:     Mouth: Mucous membranes are moist.     Pharynx: Uvula midline. Posterior oropharyngeal erythema present. No oropharyngeal exudate.  Eyes:     General: No scleral icterus.       Right eye: No discharge.        Left eye: No discharge.     Conjunctiva/sclera: Conjunctivae normal.     Pupils: Pupils are equal, round, and reactive to light.  Neck:     Musculoskeletal: Normal range of motion and neck supple.     Thyroid: No thyromegaly.     Trachea: No tracheal deviation.  Cardiovascular:     Rate and Rhythm: Normal rate and regular rhythm.     Heart sounds: Normal heart sounds. No murmur. No friction rub. No gallop.   Pulmonary:      Effort: Pulmonary effort is normal. No respiratory distress.     Breath sounds: No stridor. Wheezing (throughout) and rhonchi (throughout) present. No rales.  Lymphadenopathy:     Cervical: No cervical adenopathy.  Skin:    General: Skin is warm and dry.  Neurological:     Mental Status: She is alert.        Assessment & Plan    1. Acute non-recurrent maxillary sinusitis Worsening symptoms that have not responded to OTC medications. Will give Doxycycline as below. Continue allergy medications. Stay well hydrated and get plenty of rest. Call if no symptom improvement or if symptoms  worsen. - fluticasone (FLONASE SENSIMIST) 27.5 MCG/SPRAY nasal spray; Place 2 sprays into the nose daily.  Dispense: 10 g; Refill: 12  2. Chronic obstructive pulmonary disease with acute exacerbation (HCC) Medrol 57m IM as below given in office. Doxycycline for COPD exacerbation and sinusitis. Push fluids. Call if worsening.  - doxycycline (VIBRA-TABS) 100 MG tablet; Take 1 tablet (100 mg total) by mouth 2 (two) times daily.  Dispense: 20 tablet; Refill: 0 - methylPREDNISolone acetate (DEPO-MEDROL) injection 40 mg     JMar Daring PA-C  [facility]

## 2019-01-04 NOTE — Patient Instructions (Signed)
Chronic Obstructive Pulmonary Disease Chronic obstructive pulmonary disease (COPD) is a long-term (chronic) lung problem. When you have COPD, it is hard for air to get in and out of your lungs. Usually the condition gets worse over time, and your lungs will never return to normal. There are things you can do to keep yourself as healthy as possible.  Your doctor may treat your condition with: ? Medicines. ? Oxygen. ? Lung surgery.  Your doctor may also recommend: ? Rehabilitation. This includes steps to make your body work better. It may involve a team of specialists. ? Quitting smoking, if you smoke. ? Exercise and changes to your diet. ? Comfort measures (palliative care). Follow these instructions at home: Medicines  Take over-the-counter and prescription medicines only as told by your doctor.  Talk to your doctor before taking any cough or allergy medicines. You may need to avoid medicines that cause your lungs to be dry. Lifestyle  If you smoke, stop. Smoking makes the problem worse. If you need help quitting, ask your doctor.  Avoid being around things that make your breathing worse. This may include smoke, chemicals, and fumes.  Stay active, but remember to rest as well.  Learn and use tips on how to relax.  Make sure you get enough sleep. Most adults need at least 7 hours of sleep every night.  Eat healthy foods. Eat smaller meals more often. Rest before meals. Controlled breathing Learn and use tips on how to control your breathing as told by your doctor. Try:  Breathing in (inhaling) through your nose for 1 second. Then, pucker your lips and breath out (exhale) through your lips for 2 seconds.  Putting one hand on your belly (abdomen). Breathe in slowly through your nose for 1 second. Your hand on your belly should move out. Pucker your lips and breathe out slowly through your lips. Your hand on your belly should move in as you breathe out.  Controlled coughing Learn  and use controlled coughing to clear mucus from your lungs. Follow these steps: 1. Lean your head a little forward. 2. Breathe in deeply. 3. Try to hold your breath for 3 seconds. 4. Keep your mouth slightly open while coughing 2 times. 5. Spit any mucus out into a tissue. 6. Rest and do the steps again 1 or 2 times as needed. General instructions  Make sure you get all the shots (vaccines) that your doctor recommends. Ask your doctor about a flu shot and a pneumonia shot.  Use oxygen therapy and pulmonary rehabilitation if told by your doctor. If you need home oxygen therapy, ask your doctor if you should buy a tool to measure your oxygen level (oximeter).  Make a COPD action plan with your doctor. This helps you to know what to do if you feel worse than usual.  Manage any other conditions you have as told by your doctor.  Avoid going outside when it is very hot, cold, or humid.  Avoid people who have a sickness you can catch (contagious).  Keep all follow-up visits as told by your doctor. This is important. Contact a doctor if:  You cough up more mucus than usual.  There is a change in the color or thickness of the mucus.  It is harder to breathe than usual.  Your breathing is faster than usual.  You have trouble sleeping.  You need to use your medicines more often than usual.  You have trouble doing your normal activities such as getting dressed   or walking around the house. Get help right away if:  You have shortness of breath while resting.  You have shortness of breath that stops you from: ? Being able to talk. ? Doing normal activities.  Your chest hurts for longer than 5 minutes.  Your skin color is more blue than usual.  Your pulse oximeter shows that you have low oxygen for longer than 5 minutes.  You have a fever.  You feel too tired to breathe normally. Summary  Chronic obstructive pulmonary disease (COPD) is a long-term lung problem.  The way your  lungs work will never return to normal. Usually the condition gets worse over time. There are things you can do to keep yourself as healthy as possible.  Take over-the-counter and prescription medicines only as told by your doctor.  If you smoke, stop. Smoking makes the problem worse. This information is not intended to replace advice given to you by your health care provider. Make sure you discuss any questions you have with your health care provider. Document Released: 03/21/2008 Document Revised: 11/07/2016 Document Reviewed: 11/07/2016 Elsevier Interactive Patient Education  2019 [employer].

## 2019-01-06 ENCOUNTER — Encounter: Payer: Self-pay | Admitting: *Deleted

## 2019-01-10 ENCOUNTER — Encounter: Payer: Self-pay | Admitting: *Deleted

## 2019-01-21 ENCOUNTER — Other Ambulatory Visit: Payer: Self-pay

## 2019-01-21 ENCOUNTER — Ambulatory Visit (INDEPENDENT_AMBULATORY_CARE_PROVIDER_SITE_OTHER): Payer: Medicare HMO

## 2019-01-21 DIAGNOSIS — Z0000 Encounter for general adult medical examination without abnormal findings: Secondary | ICD-10-CM | POA: Diagnosis not present

## 2019-01-21 DIAGNOSIS — J9610 Chronic respiratory failure, unspecified whether with hypoxia or hypercapnia: Secondary | ICD-10-CM | POA: Diagnosis not present

## 2019-01-21 NOTE — Patient Instructions (Signed)
Theresa [name] , Thank you for taking time to come for your Medicare Wellness Visit. I appreciate your ongoing commitment to your health goals. Please review the following plan we discussed and let me know if I can assist you in the future.   Screening recommendations/referrals: Colonoscopy: Up to date, due 01/2024 Mammogram: Up to date, due 06/2020 Bone Density: Not required until age 62. Recommended yearly ophthalmology/optometry visit for glaucoma screening and checkup Recommended yearly dental visit for hygiene and checkup  Vaccinations: Influenza vaccine: Up to date Pneumococcal vaccine: Not required until age 13. Tdap vaccine: Pt declines today.  Shingles vaccine: Pt declines today.     Advanced directives: Advance directive discussed with you today. Even though you declined this today please call our office should you change your mind and we can give you the proper paperwork for you to fill out.  Conditions/risks identified: Fall risk prevention; Obesity- Continue avoiding sugars in daily diet.   Next appointment: 03/18/19 @ 1:20 PM with Simona Huh Chrismon.  Preventive Care 40-64 Years, Female Preventive care refers to lifestyle choices and visits with your health care provider that can promote health and wellness. What does preventive care include?  A yearly physical exam. This is also called an annual well check.  Dental exams once or twice a year.  Routine eye exams. Ask your health care provider how often you should have your eyes checked.  Personal lifestyle choices, including:  Daily care of your teeth and gums.  Regular physical activity.  Eating a healthy diet.  Avoiding tobacco and drug use.  Limiting alcohol use.  Practicing safe sex.  Taking low-dose aspirin daily starting at age 69.  Taking vitamin and mineral supplements as recommended by your health care provider. What happens during an annual well check? The services and screenings done by your health  care provider during your annual well check will depend on your age, overall health, lifestyle risk factors, and family history of disease. Counseling  Your health care provider may ask you questions about your:  Alcohol use.  Tobacco use.  Drug use.  Emotional well-being.  Home and relationship well-being.  Sexual activity.  Eating habits.  Work and work Statistician.  Method of birth control.  Menstrual cycle.  Pregnancy history. Screening  You may have the following tests or measurements:  Height, weight, and BMI.  Blood pressure.  Lipid and cholesterol levels. These may be checked every 5 years, or more frequently if you are over 32 years old.  Skin check.  Lung cancer screening. You may have this screening every year starting at age 22 if you have a 30-pack-year history of smoking and currently smoke or have quit within the past 15 years.  Fecal occult blood test (FOBT) of the stool. You may have this test every year starting at age 19.  Flexible sigmoidoscopy or colonoscopy. You may have a sigmoidoscopy every 5 years or a colonoscopy every 10 years starting at age 34.  Hepatitis C blood test.  Hepatitis B blood test.  Sexually transmitted disease (STD) testing.  Diabetes screening. This is done by checking your blood sugar (glucose) after you have not eaten for a while (fasting). You may have this done every 1-3 years.  Mammogram. This may be done every 1-2 years. Talk to your health care provider about when you should start having regular mammograms. This may depend on whether you have a family history of breast cancer.  BRCA-related cancer screening. This may be done if you have a  family history of breast, ovarian, tubal, or peritoneal cancers.  Pelvic exam and Pap test. This may be done every 3 years starting at age 69. Starting at age 3, this may be done every 5 years if you have a Pap test in combination with an HPV test.  Bone density scan. This is  done to screen for osteoporosis. You may have this scan if you are at high risk for osteoporosis. Discuss your test results, treatment options, and if necessary, the need for more tests with your health care provider. Vaccines  Your health care provider may recommend certain vaccines, such as:  Influenza vaccine. This is recommended every year.  Tetanus, diphtheria, and acellular pertussis (Tdap, Td) vaccine. You may need a Td booster every 10 years.  Zoster vaccine. You may need this after age 61.  Pneumococcal 13-valent conjugate (PCV13) vaccine. You may need this if you have certain conditions and were not previously vaccinated.  Pneumococcal polysaccharide (PPSV23) vaccine. You may need one or two doses if you smoke cigarettes or if you have certain conditions. Talk to your health care provider about which screenings and vaccines you need and how often you need them. This information is not intended to replace advice given to you by your health care provider. Make sure you discuss any questions you have with your health care provider. Document Released: 10/30/2015 Document Revised: 06/22/2016 Document Reviewed: 08/04/2015 Elsevier Interactive Patient Education  2017 [place] Prevention in the Home Falls can cause injuries. They can happen to people of all ages. There are many things you can do to make your home safe and to help prevent falls. What can I do on the outside of my home?  Regularly fix the edges of walkways and driveways and fix any cracks.  Remove anything that might make you trip as you walk through a door, such as a raised step or threshold.  Trim any bushes or trees on the path to your home.  Use bright outdoor lighting.  Clear any walking paths of anything that might make someone trip, such as rocks or tools.  Regularly check to see if handrails are loose or broken. Make sure that both sides of any steps have handrails.  Any raised decks and  porches should have guardrails on the edges.  Have any leaves, snow, or ice cleared regularly.  Use sand or salt on walking paths during winter.  Clean up any spills in your garage right away. This includes oil or grease spills. What can I do in the bathroom?  Use night lights.  Install grab bars by the toilet and in the tub and shower. Do not use towel bars as grab bars.  Use non-skid mats or decals in the tub or shower.  If you need to sit down in the shower, use a plastic, non-slip stool.  Keep the floor dry. Clean up any water that spills on the floor as soon as it happens.  Remove soap buildup in the tub or shower regularly.  Attach bath mats securely with double-sided non-slip rug tape.  Do not have throw rugs and other things on the floor that can make you trip. What can I do in the bedroom?  Use night lights.  Make sure that you have a light by your bed that is easy to reach.  Do not use any sheets or blankets that are too big for your bed. They should not hang down onto the floor.  Have a firm  chair that has side arms. You can use this for support while you get dressed.  Do not have throw rugs and other things on the floor that can make you trip. What can I do in the kitchen?  Clean up any spills right away.  Avoid walking on wet floors.  Keep items that you use a lot in easy-to-reach places.  If you need to reach something above you, use a strong step stool that has a grab bar.  Keep electrical cords out of the way.  Do not use floor polish or wax that makes floors slippery. If you must use wax, use non-skid floor wax.  Do not have throw rugs and other things on the floor that can make you trip. What can I do with my stairs?  Do not leave any items on the stairs.  Make sure that there are handrails on both sides of the stairs and use them. Fix handrails that are broken or loose. Make sure that handrails are as long as the stairways.  Check any  carpeting to make sure that it is firmly attached to the stairs. Fix any carpet that is loose or worn.  Avoid having throw rugs at the top or bottom of the stairs. If you do have throw rugs, attach them to the floor with carpet tape.  Make sure that you have a light switch at the top of the stairs and the bottom of the stairs. If you do not have them, ask someone to add them for you. What else can I do to help prevent falls?  Wear shoes that:  Do not have high heels.  Have rubber bottoms.  Are comfortable and fit you well.  Are closed at the toe. Do not wear sandals.  If you use a stepladder:  Make sure that it is fully opened. Do not climb a closed stepladder.  Make sure that both sides of the stepladder are locked into place.  Ask someone to hold it for you, if possible.  Clearly mark and make sure that you can see:  Any grab bars or handrails.  First and last steps.  Where the edge of each step is.  Use tools that help you move around (mobility aids) if they are needed. These include:  Canes.  Walkers.  Scooters.  Crutches.  Turn on the lights when you go into a dark area. Replace any light bulbs as soon as they burn out.  Set up your furniture so you have a clear path. Avoid moving your furniture around.  If any of your floors are uneven, fix them.  If there are any pets around you, be aware of where they are.  Review your medicines with your doctor. Some medicines can make you feel dizzy. This can increase your chance of falling. Ask your doctor what other things that you can do to help prevent falls. This information is not intended to replace advice given to you by your health care provider. Make sure you discuss any questions you have with your health care provider. Document Released: 07/30/2009 Document Revised: 03/10/2016 Document Reviewed: 11/07/2014 Elsevier Interactive Patient Education  2017 [employer].

## 2019-01-21 NOTE — Progress Notes (Addendum)
Subjective:   Theresa [name] is a 63 y.o. female who presents for Medicare Annual (Subsequent) preventive examination.  This visit is being conducted via telephone due to the COVID-19 pandemic. This patient has given me verbal consent via telephone to conduct this visit. Some vital signs may be absent or patient reported.  Patient identification: identified by name, DOB, and current address.     Review of Systems:  N/A  Cardiac Risk Factors include: hypertension;obesity (BMI >30kg/m2);smoking/ tobacco exposure     Objective:     Vitals: There were no vitals taken for this visit.  There is no height or weight on file to calculate BMI. Unable to obtain vital signs due to completing AWV via video chat.   Advanced Directives 01/21/2019 12/13/2018 09/24/2018 08/13/2018 08/07/2018 05/02/2018 02/16/2018  Does Patient Have a Medical Advance Directive? _0  No No  Type of Advance Directive - - - - - - -  Does patient want to make changes to medical advance directive? - - - - - - -  Copy of [place] in Chart? - - - - - - -  Would patient like information on creating a medical advance directive? No - Patient declined No - Patient declined No - Patient declined - - No - Patient declined No - Patient declined    Tobacco Social History   Tobacco Use  Smoking Status Current Every Day Smoker  . Packs/day: 1.00  . Years: 44.00  . Pack years: 44.00  . Types: Cigarettes  Smokeless Tobacco Never Used     Ready to quit: Not Answered Counseling given: Not Answered   Clinical Intake:  Pre-visit preparation completed: Yes  Pain : 0-10 Pain Score: 6  Pain Type: Chronic pain Pain Location: Back Pain Orientation: Lower Pain Descriptors / Indicators: Aching, Dull Pain Frequency: Constant     Nutritional Status: BMI > 30  Obese Nutritional Risks: None Diabetes: No  How often do you need to have someone help you when you read instructions, pamphlets, or other  written materials from your doctor or pharmacy?: 1 - Never  Interpreter Needed?: No  Information entered by :: [facility], LPN  Past Medical History:  Diagnosis Date  . Acute postoperative pain 10/03/2017  . Anxiety   . BiPAP (biphasic positive airway pressure) dependence    at hs  . Carpal tunnel syndrome   . CHF (congestive heart failure) ([place])    1/18  . CHF (congestive heart failure) ([place])   . Chronic generalized abdominal pain   . Chronic rhinitis   . COPD (chronic obstructive pulmonary disease) ([place])   . DDD (degenerative disc disease), cervical   . DDD (degenerative disc disease), lumbosacral   . Depression   . Dyspnea   . Edema   . Flu    1/18  . Gastritis   . GERD (gastroesophageal reflux disease)   . Hematuria   . Hernia of abdominal wall 07/17/2018  . Hernia, abdominal   . Hypertension   . Kidney stones   . Low back pain   . Lumbar radiculitis   . Malodorous urine   . Muscle weakness   . Obesity   . Oxygen dependent   . Renal cyst   . Sensory urge incontinence   . Thyroid activity decreased    9/19  . Tobacco abuse   . Wheezing    Past Surgical History:  Procedure Laterality Date  . ABDOMINAL HYSTERECTOMY    . CARPAL TUNNEL RELEASE  Left 2012  . EPIGASTRIC HERNIA REPAIR N/A 08/13/2018   HERNIA REPAIR EPIGASTRIC ADULT; polypropylene mesh reinforcement.  Surgeon: Robert Bellow, MD;  Location: [facility] ORS;  Service: General;  Laterality: N/A;  . TONSILLECTOMY    . TUBAL LIGATION    . UMBILICAL HERNIA REPAIR N/A 08/13/2018   HERNIA REPAIR UMBILICAL ADULT; polypropylene mesh reinforcement Surgeon: Robert Bellow, MD;  Location: [facility] ORS;  Service: General;  Laterality: N/A;   Family History  Problem Relation Age of Onset  . Heart disease Mother   . Stroke Mother   . Coronary artery disease Mother   . Lung cancer Sister   . Cancer Sister   . Breast cancer Sister   . Alcohol abuse Father   . Heart disease Father   . Cancer Brother   . Cancer  Brother   . Pneumonia Brother   . Prostate cancer Neg Hx   . Kidney cancer Neg Hx   . Bladder Cancer Neg Hx    Social History   Socioeconomic History  . Marital status: Married    Spouse name: Ovid Curd  . Number of children: 2  . Years of education: Not on file  . Highest education level: 12th grade  Occupational History  . Occupation: disability  Social Needs  . Financial resource strain: Somewhat hard  . Food insecurity:    Worry: Never true    Inability: Never true  . Transportation needs:    Medical: No    Non-medical: No  Tobacco Use  . Smoking status: Current Every Day Smoker    Packs/day: 1.00    Years: 44.00    Pack years: 44.00    Types: Cigarettes  . Smokeless tobacco: Never Used  Substance and Sexual Activity  . Alcohol use: No    Frequency: Never  . Drug use: No  . Sexual activity: Not Currently    Birth control/protection: Surgical  Lifestyle  . Physical activity:    Days per week: 0 days    Minutes per session: 0 min  . Stress: Rather much  Relationships  . Social connections:    Talks on phone: Patient refused    Gets together: Patient refused    Attends religious service: Patient refused    Active member of club or organization: Patient refused    Attends meetings of clubs or organizations: Patient refused    Relationship status: Patient refused  Other Topics Concern  . Not on file  Social History Narrative   ** Merged History Encounter **       Lives with spouse     Outpatient Encounter Medications as of 01/21/2019  Medication Sig  . ALPRAZolam (XANAX) 0.5 MG tablet Take 1 tablet (0.5 mg total) by mouth 2 (two) times daily as needed for anxiety.  Marland Kitchen aspirin 81 MG tablet Take 81 mg by mouth daily.  . bisoprolol-hydrochlorothiazide (ZIAC) 10-6.25 MG tablet Take 1 tablet by mouth daily.  . Cholecalciferol ([employer] D3) 2000 units CAPS Take 2,000 Units by mouth daily.   . fluticasone (FLONASE SENSIMIST) 27.5 MCG/SPRAY nasal spray Place 2 sprays into  the nose daily.  . Fluticasone-Umeclidin-Vilant 100-62.5-25 MCG/INH AEPB Inhale 1 puff into the lungs daily.  Marland Kitchen gabapentin (NEURONTIN) 300 MG capsule Take 1 capsule (300 mg total) by mouth 4 (four) times daily.  Marland Kitchen HYDROcodone-acetaminophen (NORCO/VICODIN) 5-325 MG tablet Take 1 tablet by mouth every 4 (four) hours as needed for moderate pain.  Marland Kitchen HYDROcodone-acetaminophen (NORCO/VICODIN) 5-325 MG tablet Take 1 tablet by mouth every  8 (eight) hours as needed for up to 30 days for severe pain.  Marland Kitchen ibuprofen (ADVIL,MOTRIN) 200 MG tablet Take 1,200 mg by mouth every 6 (six) hours as needed for headache or moderate pain.  Marland Kitchen ipratropium-albuterol (DUONEB) 0.5-2.5 (3) MG/3ML SOLN USE 1 VIAL VIA NEBULIZER EVERY 6 HOURS AS NEEDED FOR SHORTNESS OF BREATH OR WHEEZING  . levothyroxine (SYNTHROID, LEVOTHROID) 25 MCG tablet Take 25 mcg by mouth daily before breakfast. DUPLICATE ENTRY  . OXYGEN Inhale 2.5 L into the lungs continuous. Reported on 11/03/2015  . potassium chloride SA (K-DUR,KLOR-CON) 20 MEQ tablet Take 1 tablet (20 mEq total) by mouth 2 (two) times daily.  . ranitidine (ZANTAC) 150 MG tablet Take 150 mg by mouth 2 (two) times daily.  . sertraline (ZOLOFT) 100 MG tablet TAKE 2 TABLETS(200 MG) BY MOUTH EVERY MORNING  . torsemide (DEMADEX) 20 MG tablet Take 2 tablets (40 mg total) by mouth 2 (two) times daily.  . VENTOLIN HFA 108 (90 Base) MCG/ACT inhaler Inhale 2 puffs into the lungs every 6 (six) hours as needed for shortness of breath.  . doxycycline (VIBRA-TABS) 100 MG tablet Take 1 tablet (100 mg total) by mouth 2 (two) times daily. (Patient not taking: Reported on 01/21/2019)   No facility-administered encounter medications on file as of 01/21/2019.     Activities of Daily Living In your present state of health, do you have any difficulty performing the following activities: 01/21/2019 10/25/2018  Hearing? N N  Vision? Y Y  Comment Due to cataracts. Has eye exam scheduled for 03/2019. -  Difficulty  concentrating or making decisions? N N  Walking or climbing stairs? Y Y  Comment Due to SOB and back pain.  -  Dressing or bathing? N N  Doing errands, shopping? N Y  Conservation officer, nature and eating ? N -  Using the Toilet? N -  In the past six months, have you accidently leaked urine? Y -  Comment Occasionally, wears protection at all times.  -  Do you have problems with loss of bowel control? N -  Managing your Medications? N -  Managing your Finances? N -  Housekeeping or managing your Housekeeping? Y -  Comment Husband does housework.  -  Some recent data might be hidden    Patient Care Team: Chrismon, Vickki Muff, PA as PCP - General (Family Medicine) [name], FNP as Nurse Practitioner (Cardiology) Yolonda Kida, MD as Consulting Physician (Cardiology) Erby Pian, MD as Referring Physician (Pulmonary Disease) Harlin Heys, MD as Consulting Physician (Obstetrics and Gynecology) Milinda Pointer, MD as Referring Physician (Pain Medicine) Idelle Leech, OD as Consulting Physician (Optometry)    Assessment:   This is a routine wellness examination for [facility].  Exercise Activities and Dietary recommendations Current Exercise Habits: The patient does not participate in regular exercise at present, Exercise limited by: orthopedic condition(s);respiratory conditions(s)  Goals    . Diet     Recommend current diet plan of eating baked foods, avoiding sugars and drinking lots of water.     Marland Kitchen LIFESTYLE - DECREASE FALLS RISK     Recommend to remove any items from the home that may cause slips or trips.       Fall Risk: Fall Risk  01/21/2019 12/13/2018 10/29/2018 10/25/2018 09/24/2018  Falls in the past year? 1 0 0 1 1  Comment - - - - -  Number falls in past yr: 1 - - 0 -  Injury with Fall? 0 - -  0 0  Comment - - - - -  Risk Factor Category  - - - - -  Risk for fall due to : - - - - -  Risk for fall due to: Comment - - - - -  Follow up Falls prevention  discussed - - - -    FALL RISK PREVENTION PERTAINING TO THE HOME:  Any stairs in or around the home? Yes  If so, are there any without handrails? Yes   Home free of loose throw rugs in walkways, pet beds, electrical cords, etc? Yes  Adequate lighting in your home to reduce risk of falls? Yes   ASSISTIVE DEVICES UTILIZED TO PREVENT FALLS:  Life alert? No  Use of a cane, walker or w/c? Yes  Grab bars in the bathroom? Yes Shower chair or bench in shower? Yes  Elevated toilet seat or a handicapped toilet? No    TIMED UP AND GO:  Was the test performed? No .    Depression Screen PHQ 2/9 Scores 01/21/2019 12/13/2018 07/31/2018 07/09/2018  PHQ - 2 Score 1 0 1 1  PHQ- 9 Score - - - -  Exception Documentation - - Patient refusal -     Cognitive Function     6CIT Screen 01/21/2019 01/18/2018  What Year? 0 points 0 points  What month? 0 points 0 points  What time? 0 points 0 points  Count back from 20 0 points 0 points  Months in reverse 0 points 0 points  Repeat phrase 2 points 4 points  Total Score 2 4    Immunization History  Administered Date(s) Administered  . Influenza Split 09/02/2014  . Influenza,inj,Quad PF,6+ Mos 07/01/2015, 08/24/2017, 07/13/2018  . Influenza-Unspecified 08/29/2016  . Pneumococcal Polysaccharide-23 07/01/2015, 11/03/2016  . Pneumococcal-Unspecified 07/24/2011    Qualifies for Shingles Vaccine? Yes . Due for Shingrix. Education has been provided regarding the importance of this vaccine. Pt has been advised to call insurance company to determine out of pocket expense. Advised may also receive vaccine at local pharmacy or Health Dept. Verbalized acceptance and understanding.  Tdap: Although this vaccine is not a covered service during a Wellness Exam, does the patient still wish to receive this vaccine today?  No .  Education has been provided regarding the importance of this vaccine. Advised may receive this vaccine at local pharmacy or Health Dept.  Aware to provide a copy of the vaccination record if obtained from local pharmacy or Health Dept. Verbalized acceptance and understanding.  Flu Vaccine: Up to date  Screening Tests Health Maintenance  Topic Date Due  . TETANUS/TDAP  05/22/1976  . INFLUENZA VACCINE  05/18/2019  . MAMMOGRAM  06/22/2020  . PAP SMEAR-Modifier  04/13/2021  . COLONOSCOPY  01/22/2024  . Hepatitis C Screening  Completed  . HIV Screening  Completed    Cancer Screenings:  Colorectal Screening: Completed 01/21/14. Repeat every 10 years.  Mammogram: Completed 06/22/18.   Lung Cancer Screening: (Low Dose CT Chest recommended if Age 77-80 years, 30 pack-year currently smoking OR have quit w/in 15years.) does qualify however is not due until 02/03/2019.  Additional Screening:  Hepatitis C Screening: Up to date  Vision Screening: Recommended annual ophthalmology exams for early detection of glaucoma and other disorders of the eye.  Dental Screening: Recommended annual dental exams for proper oral hygiene  Community Resource Referral:  CRR required this visit?  No      Plan:  I have personally reviewed and addressed the Medicare Annual Wellness questionnaire and  have noted the following in the patient's chart:  A. Medical and social history B. Use of alcohol, tobacco or illicit drugs  C. Current medications and supplements D. Functional ability and status E.  Nutritional status F.  Physical activity G. Advance directives H. List of other physicians I.  Hospitalizations, surgeries, and ER visits in previous 12 months J.  [place] such as hearing and vision if needed, cognitive and depression L. Referrals and appointments   In addition, I have reviewed and discussed with patient certain preventive protocols, quality metrics, and best practice recommendations. A written personalized care plan for preventive services as well as general preventive health recommendations were provided to  patient. Nurse Health Advisor  Signed,    Carmelina Balducci [place], [place]  11/17/9756 Nurse Health Advisor   Nurse Notes: Pt declined the tetanus vaccine today.   Reviewed documentation and plan after Nurse Health Advisor's health screening. Agree with note and recommendations. Will schedule follow up visit when COVID-19 restrictions allow for this patient with severe chronic respiratory illness.

## 2019-01-30 ENCOUNTER — Ambulatory Visit: Payer: Medicare HMO | Admitting: Nurse Practitioner

## 2019-01-30 ENCOUNTER — Other Ambulatory Visit: Payer: Self-pay

## 2019-01-30 DIAGNOSIS — J9610 Chronic respiratory failure, unspecified whether with hypoxia or hypercapnia: Secondary | ICD-10-CM | POA: Diagnosis not present

## 2019-02-02 DIAGNOSIS — J9610 Chronic respiratory failure, unspecified whether with hypoxia or hypercapnia: Secondary | ICD-10-CM | POA: Diagnosis not present

## 2019-02-04 ENCOUNTER — Other Ambulatory Visit: Payer: Self-pay

## 2019-02-04 ENCOUNTER — Ambulatory Visit: Payer: Medicare HMO | Attending: Nurse Practitioner | Admitting: Nurse Practitioner

## 2019-02-04 DIAGNOSIS — M47817 Spondylosis without myelopathy or radiculopathy, lumbosacral region: Secondary | ICD-10-CM

## 2019-02-04 DIAGNOSIS — G8929 Other chronic pain: Secondary | ICD-10-CM | POA: Diagnosis not present

## 2019-02-04 DIAGNOSIS — G894 Chronic pain syndrome: Secondary | ICD-10-CM | POA: Diagnosis not present

## 2019-02-04 DIAGNOSIS — M48062 Spinal stenosis, lumbar region with neurogenic claudication: Secondary | ICD-10-CM

## 2019-02-04 DIAGNOSIS — M533 Sacrococcygeal disorders, not elsewhere classified: Secondary | ICD-10-CM | POA: Diagnosis not present

## 2019-02-04 MED ORDER — GABAPENTIN 300 MG PO CAPS: 300.0000 mg | ORAL_CAPSULE | Freq: Four times a day (QID) | ORAL | 3 refills | Status: DC

## 2019-02-04 MED ORDER — HYDROCODONE-ACETAMINOPHEN 5-325 MG PO TABS: 1.0000 | ORAL_TABLET | Freq: Three times a day (TID) | ORAL | 0 refills | Status: DC | PRN

## 2019-02-04 NOTE — Progress Notes (Signed)
Pain Management Encounter Note - Virtual Visit via Telephone Telehealth (real-time audio visits between healthcare provider and patient).  Patient's Phone No. & Preferred Pharmacy:  [phone] (home); [phone] (mobile); (Preferred) [facility] [address] [place] [address] Phone: [phone] Fax: [phone]   Pre-screening note:  Our staff contacted Theresa [name] and offered her an "in person", "face-to-face" appointment versus a telephone encounter. She indicated preferring the telephone encounter, at this time.  Reason for Virtual Visit: COVID-19*  Social distancing based on CDC and AMA recommendations.   I contacted Theresa [name] on 02/04/2019 at 11:39 AM by telephone and clearly identified myself as [name], NP. I verified that I was speaking with the correct person using two identifiers (Name and date of birth: 26-Feb-1956).  Advanced Informed Consent I sought verbal advanced consent from Theresa [name] for telemedicine interactions and virtual visit. I informed Theresa [name] of the security and privacy concerns, risks, and limitations associated with performing an evaluation and management service by telephone. I also informed Theresa [name] of the availability of "in person" appointments and I informed her of the possibility of a patient responsible charge related to this service. Theresa [name] expressed understanding and agreed to proceed.   Historic Elements   Ms. Dennys [name] is a 63 y.o. year old, female patient evaluated today after her last encounter by our practice on 12/14/2018. [name]  has a past medical history of Acute postoperative pain (10/03/2017), Anxiety, BiPAP (biphasic positive airway pressure) dependence, Carpal tunnel syndrome, CHF (congestive heart failure) ([place]), CHF (congestive heart failure) ([place]), Chronic generalized abdominal pain, Chronic rhinitis, COPD  (chronic obstructive pulmonary disease) ([place]), DDD (degenerative disc disease), cervical, DDD (degenerative disc disease), lumbosacral, Depression, Dyspnea, Edema, Flu, Gastritis, GERD (gastroesophageal reflux disease), Hematuria, Hernia of abdominal wall (07/17/2018), Hernia, abdominal, Hypertension, Kidney stones, Low back pain, Lumbar radiculitis, Malodorous urine, Muscle weakness, Obesity, Oxygen dependent, Renal cyst, Sensory urge incontinence, Thyroid activity decreased, Tobacco abuse, and Wheezing. She also  has a past surgical history that includes Abdominal hysterectomy; Tonsillectomy; Carpal tunnel release (Left, 2012); Tubal ligation; epigastric hernia repair (N/A, 27/51/7001); and Umbilical hernia repair (N/A, 08/13/2018). [name] has a current medication list which includes the following prescription(s): alprazolam, aspirin, bisoprolol-hydrochlorothiazide, cholecalciferol, doxycycline, fluticasone, fluticasone-umeclidin-vilant, gabapentin, hydrocodone-acetaminophen, hydrocodone-acetaminophen, hydrocodone-acetaminophen, ibuprofen, ipratropium-albuterol, levothyroxine, oxygen-helium, potassium chloride sa, ranitidine, sertraline, torsemide, and ventolin hfa. She  reports that she has been smoking cigarettes. She has a 44.00 pack-year smoking history. She has never used smokeless tobacco. She reports that she does not drink alcohol or use drugs. [name] is allergic to codeine and meloxicam.   HPI  I last saw her on 10/29/2018. She is being evaluated for both, medication management and a post-procedure assessment. She is having 6/10 lower back pain. She has leg pain with standing in both legs to her knee on the right and into her foot on the left. She denies numbness and tingling. She complains of pressure. She has weakness in her left leg. She has had recent fall. She did call EMS. She denies any fractures. She denies any new concerns. She feels like her leg gave away. She admits that the back  pain kicks in with standing up.   Pharmacotherapy Assessment  Analgesic:Hydrocodone/APAP 5/325 one tablet every 8 hours (15 mg/day of hydrocodone) MME/day:15 mg/day  Monitoring: Pharmacotherapy: No side-effects or adverse reactions reported. [place] PMP: PDMP reviewed during  this encounter.       Compliance: No problems identified. Plan: Refer to "POC". Evaluation of last interventional procedure  12/13/2018 Procedure: Bilateral Lumbar facet Pre-procedure pain score:  6/10 Post-procedure pain score: 0/10         Influential Factors: Intra-procedural challenges: None observed.         Reported side-effects: None.        Post-procedural adverse reactions or complications: None reported         Sedation: Please see nurses note for DOS. When no sedatives are used, the analgesic levels obtained are directly associated to the effectiveness of the local anesthetics. However, when sedation is provided, the level of analgesia obtained during the initial 1 hour following the intervention, is believed to be the result of a combination of factors. These factors may include, but are not limited to: 1. The effectiveness of the local anesthetics used. 2. The effects of the analgesic(s) and/or anxiolytic(s) used. 3. The degree of discomfort experienced by the patient at the time of the procedure. 4. The patients ability and reliability in recalling and recording the events. 5. The presence and influence of possible secondary gains and/or psychosocial factors. Reported result: Relief experienced during the 1st hour after the procedure:   (Ultra-Short Term Relief) [name] has indicated area to have been numb during this time. Interpretative annotation: Clinically appropriate result. Analgesia during this period is likely to be Local Anesthetic and/or IV Sedative (Analgesic/Anxiolytic) related.          Effects of local anesthetic: The analgesic effects attained during this period are directly associated  to the localized infiltration of local anesthetics and therefore cary significant diagnostic value as to the etiological location, or anatomical origin, of the pain. Expected duration of relief is directly dependent on the pharmacodynamics of the local anesthetic used. Long-acting (4-6 hours) anesthetics used.  Reported result: Relief during the next 4 to 6 hour after the procedure:   (Short-Term Relief) Ms. Roediger has indicated area to have been numb during this time. Interpretative annotation: Clinically appropriate result. Analgesia during this period is likely to be Local Anesthetic-related.          Long-term benefit: Defined as the period of time past the expected duration of local anesthetics (1 hour for short-acting and 4-6 hours for long-acting). With the possible exception of prolonged sympathetic blockade from the local anesthetics, benefits during this period are typically attributed to, or associated with, other factors such as analgesic sensory neuropraxia, antiinflammatory effects, or beneficial biochemical changes provided by agents other than the local anesthetics.  Reported result: Extended relief following procedure:   (Long-Term Relief) Ms. Gilkey reports that both, extremity and the axial pain improved with the treatment. for 3 days however she is now back to baseline.  Interpretative annotation: Clinically appropriate result. Good relief. No permanent benefit expected.          Review of recent tests  DG C-Arm 1-60 Min-No Report Fluoroscopy was utilized by the requesting physician.  No radiographic  interpretation.    Office Visit on 11/26/2018  Component Date Value Ref Range Status  . WBC 11/29/2018 8.6  3.4 - 10.8 x10E3/uL Final  . RBC 11/29/2018 4.88  3.77 - 5.28 x10E6/uL Final  . Hemoglobin 11/29/2018 14.9  11.1 - 15.9 g/dL Final  . Hematocrit 11/29/2018 46.5  34.0 - 46.6 % Final  . MCV 11/29/2018 95  79 - 97 fL Final  . MCH 11/29/2018 30.5  26.6 - 33.0 pg Final  .  MCHC 11/29/2018 32.0  31.5 - 35.7 g/dL Final  . RDW 11/29/2018 15.9* 11.7 - 15.4 % Final  . Platelets 11/29/2018 167  150 - 450 x10E3/uL Final  . Neutrophils 11/29/2018 71  Not Estab. % Final  . Lymphs 11/29/2018 18  Not Estab. % Final  . Monocytes 11/29/2018 7  Not Estab. % Final  . Eos 11/29/2018 2  Not Estab. % Final  . Basos 11/29/2018 1  Not Estab. % Final  . Neutrophils Absolute 11/29/2018 6.2  1.4 - 7.0 x10E3/uL Final  . Lymphocytes Absolute 11/29/2018 1.6  0.7 - 3.1 x10E3/uL Final  . Monocytes Absolute 11/29/2018 0.6  0.1 - 0.9 x10E3/uL Final  . EOS (ABSOLUTE) 11/29/2018 0.2  0.0 - 0.4 x10E3/uL Final  . Basophils Absolute 11/29/2018 0.1  0.0 - 0.2 x10E3/uL Final  . Immature Granulocytes 11/29/2018 1  Not Estab. % Final  . Immature Grans (Abs) 11/29/2018 0.0  0.0 - 0.1 x10E3/uL Final   Assessment  The primary encounter diagnosis was Spondylosis without myelopathy or radiculopathy, lumbosacral region. Diagnoses of Chronic pain syndrome, Spinal stenosis of lumbar region with neurogenic claudication, and Chronic sacroiliac joint pain (Bilateral) were also pertinent to this visit.  Plan of Care  I am having Theresa [name] maintain her OXYGEN, Cholecalciferol, ranitidine, ALPRAZolam, Ventolin HFA, aspirin, bisoprolol-hydrochlorothiazide, torsemide, levothyroxine, ibuprofen, potassium chloride SA, Fluticasone-Umeclidin-Vilant, sertraline, ipratropium-albuterol, doxycycline, fluticasone, gabapentin, HYDROcodone-acetaminophen, HYDROcodone-acetaminophen, and HYDROcodone-acetaminophen.  Pharmacotherapy (Medications Ordered): Meds ordered this encounter  Medications  . gabapentin (NEURONTIN) 300 MG capsule    Sig: Take 1 capsule (300 mg total) by mouth 4 (four) times daily.    Dispense:  120 capsule    Refill:  3    Do not place medication on "Automatic Refill". Fill one day early if pharmacy is closed on scheduled refill date.    Order Specific Question:   Supervising Provider    Answer:    Milinda Pointer [phone]  . HYDROcodone-acetaminophen (NORCO/VICODIN) 5-325 MG tablet    Sig: Take 1 tablet by mouth every 8 (eight) hours as needed for up to 30 days for severe pain.    Dispense:  90 tablet    Refill:  0    Do not add this medication to the electronic "Automatic Refill" notification system. Patient may have prescription filled one day early if pharmacy is closed on scheduled refill date.    Order Specific Question:   Supervising Provider    Answer:   Milinda Pointer [phone]  . HYDROcodone-acetaminophen (NORCO/VICODIN) 5-325 MG tablet    Sig: Take 1 tablet by mouth every 8 (eight) hours as needed for up to 30 days for severe pain.    Dispense:  90 tablet    Refill:  0    Do not add this medication to the electronic "Automatic Refill" notification system. Patient may have prescription filled one day early if pharmacy is closed on scheduled refill date.    Order Specific Question:   Supervising Provider    Answer:   Milinda Pointer [phone]  . HYDROcodone-acetaminophen (NORCO/VICODIN) 5-325 MG tablet    Sig: Take 1 tablet by mouth every 8 (eight) hours as needed for up to 30 days for severe pain.    Dispense:  90 tablet    Refill:  0    Do not add this medication to the electronic "Automatic Refill" notification system. Patient may have prescription filled one day early if pharmacy is closed on scheduled refill date.    Order Specific Question:   Supervising Provider  AnswerMilinda Pointer [[id number]]   Orders:  No orders of the defined types were placed in this encounter.  Follow-up plan:   Return in about 3 months (around 05/06/2019) for MedMgmt.   I discussed the assessment and treatment plan with the patient. The patient was provided an opportunity to ask questions and all were answered. The patient agreed with the plan and demonstrated an understanding of the instructions.  Patient advised to call back or seek an in-person evaluation if the symptoms  or condition worsens.  Total duration of non-face-to-face encounter: 15 minutes.  Note by: [name], NP Date: 02/04/2019; Time: 11:56 AM  Disclaimer:  * Given the special circumstances of the COVID-19 pandemic, the federal government has announced that the Office for Civil Rights (OCR) will exercise its enforcement discretion and will not impose penalties on physicians using telehealth in the event of noncompliance with regulatory requirements under the [place] (HIPAA) in connection with the good faith provision of telehealth during the YTKZS-01 national public health emergency. ([place])

## 2019-02-04 NOTE — Patient Instructions (Signed)
____________________________________________________________________________________________  Medication Rules  Purpose: To inform patients, and their family members, of our rules and regulations.  Applies to: All patients receiving prescriptions (written or electronic).  Pharmacy of record: Pharmacy where electronic prescriptions will be sent. If written prescriptions are taken to a different pharmacy, please inform the nursing staff. The pharmacy listed in the electronic medical record should be the one where you would like electronic prescriptions to be sent.  Electronic prescriptions: In compliance with the [place] (STOP) Act of 2017 (Session Lanny Cramp [phone]), effective October 17, 2018, all controlled substances must be electronically prescribed. Calling prescriptions to the pharmacy will cease to exist.  Prescription refills: Only during scheduled appointments. Applies to all prescriptions.  NOTE: The following applies primarily to controlled substances (Opioid* Pain Medications).   Patient's responsibilities: 1. Pain Pills: Bring all pain pills to every appointment (except for procedure appointments). 2. Pill Bottles: Bring pills in original pharmacy bottle. Always bring the newest bottle. Bring bottle, even if empty. 3. Medication refills: You are responsible for knowing and keeping track of what medications you take and those you need refilled. The day before your appointment: write a list of all prescriptions that need to be refilled. The day of the appointment: give the list to the admitting nurse. Prescriptions will be written only during appointments. No prescriptions will be written on procedure days. If you forget a medication: it will not be "Called in", "Faxed", or "electronically sent". You will need to get another appointment to get these prescribed. No early refills. Do not call asking to have your prescription filled  early. 4. Prescription Accuracy: You are responsible for carefully inspecting your prescriptions before leaving our office. Have the discharge nurse carefully go over each prescription with you, before taking them home. Make sure that your name is accurately spelled, that your address is correct. Check the name and dose of your medication to make sure it is accurate. Check the number of pills, and the written instructions to make sure they are clear and accurate. Make sure that you are given enough medication to last until your next medication refill appointment. 5. Taking Medication: Take medication as prescribed. When it comes to controlled substances, taking less pills or less frequently than prescribed is permitted and encouraged. Never take more pills than instructed. Never take medication more frequently than prescribed.  6. Inform other Doctors: Always inform, all of your healthcare providers, of all the medications you take. 7. Pain Medication from other Providers: You are not allowed to accept any additional pain medication from any other Doctor or Healthcare provider. There are two exceptions to this rule. (see below) In the event that you require additional pain medication, you are responsible for notifying us, as stated below. 8. Medication Agreement: You are responsible for carefully reading and following our Medication Agreement. This must be signed before receiving any prescriptions from our practice. Safely store a copy of your signed Agreement. Violations to the Agreement will result in no further prescriptions. (Additional copies of our Medication Agreement are available upon request.) 9. Laws, Rules, & Regulations: All patients are expected to follow all Federal and [employer], [employer], Rules, [employer]. Ignorance of the Laws does not constitute a valid excuse. The use of any illegal substances is prohibited. 10. Adopted CDC guidelines & recommendations: Target dosing levels will be  at or below 60 MME/day. Use of benzodiazepines** is not recommended.  Exceptions: There are only two exceptions to the rule of not  receiving pain medications from other Healthcare Providers. 1. Exception #1 (Emergencies): In the event of an emergency (i.e.: accident requiring emergency care), you are allowed to receive additional pain medication. However, you are responsible for: As soon as you are able, call our office [phone], at any time of the day or night, and leave a message stating your name, the date and nature of the emergency, and the name and dose of the medication prescribed. In the event that your call is answered by a member of our staff, make sure to document and save the date, time, and the name of the person that took your information.  2. Exception #2 (Planned Surgery): In the event that you are scheduled by another doctor or dentist to have any type of surgery or procedure, you are allowed (for a period no longer than 30 days), to receive additional pain medication, for the acute post-op pain. However, in this case, you are responsible for picking up a copy of our "Post-op Pain Management for Surgeons" handout, and giving it to your surgeon or dentist. This document is available at our office, and does not require an appointment to obtain it. Simply go to our office during business hours (Monday-Thursday from 8:00 AM to 4:00 PM) (Friday 8:00 AM to 12:00 Noon) or if you have a scheduled appointment with [place], prior to your surgery, and ask for it by name. In addition, you will need to provide [place] with your name, name of your surgeon, type of surgery, and date of procedure or surgery.  *Opioid medications include: morphine, codeine, oxycodone, oxymorphone, hydrocodone, hydromorphone, meperidine, tramadol, tapentadol, buprenorphine, fentanyl, methadone. **Benzodiazepine medications include: diazepam (Valium), alprazolam (Xanax), clonazepam (Klonopine), lorazepam (Ativan), clorazepate  (Tranxene), chlordiazepoxide (Librium), estazolam (Prosom), oxazepam (Serax), temazepam (Restoril), triazolam (Halcion) (Last updated: 12/14/2017) ____________________________________________________________________________________________

## 2019-02-05 DIAGNOSIS — R0609 Other forms of dyspnea: Secondary | ICD-10-CM | POA: Diagnosis not present

## 2019-02-05 DIAGNOSIS — J439 Emphysema, unspecified: Secondary | ICD-10-CM | POA: Diagnosis not present

## 2019-02-20 DIAGNOSIS — J9610 Chronic respiratory failure, unspecified whether with hypoxia or hypercapnia: Secondary | ICD-10-CM | POA: Diagnosis not present

## 2019-03-01 DIAGNOSIS — J9610 Chronic respiratory failure, unspecified whether with hypoxia or hypercapnia: Secondary | ICD-10-CM | POA: Diagnosis not present

## 2019-03-04 DIAGNOSIS — J9610 Chronic respiratory failure, unspecified whether with hypoxia or hypercapnia: Secondary | ICD-10-CM | POA: Diagnosis not present

## 2019-03-07 ENCOUNTER — Telehealth: Payer: Self-pay | Admitting: Family

## 2019-03-07 MED ORDER — METOLAZONE 5 MG PO TABS: 5.0000 mg | ORAL_TABLET | Freq: Every day | ORAL | 0 refills | Status: DC

## 2019-03-07 MED ORDER — TORSEMIDE 20 MG PO TABS: 40.0000 mg | ORAL_TABLET | Freq: Two times a day (BID) | ORAL | 3 refills | Status: DC

## 2019-03-07 NOTE — Telephone Encounter (Signed)
Patient called to say that she's gained 10 pounds in the last week. She also notices that her hands/ face and abdomen are more swollen. Denies any swelling in her legs. Has been taking 32m torsemide BID along with potassium 251m BID. She says that she's even taken extra torsemide but without relief. Denies any extra sodium intake.   Advised patient that I will send in metolazone 54m73mo take for the next 3 days. Instructed her to take it 1/2 hour prior to torsemide. If she responds well to one or two doses, she doesn't need to take all 3 doses. On the days that she takes the metolazone, she is to take an additional 26m43motassium.   Instructed her to also give me a call the first of next week to update on condition. Did remind her that she could present to the ED if symptoms worsen and patient verbalized understanding.

## 2019-03-09 ENCOUNTER — Encounter: Payer: Self-pay | Admitting: Family

## 2019-03-15 IMAGING — MR MR LUMBAR SPINE W/O CM
5 series · 30 of 48 positions shown · non-contrast
Comparison: 02/23/2016

CLINICAL DATA: Chronic low back pain with increasing severity in
the past 6 months.

EXAM:
MRI LUMBAR SPINE WITHOUT CONTRAST
TECHNIQUE: Multiplanar, multisequence MR imaging of the lumbar spine was
performed. No intravenous contrast was administered.

[Series 13: T2 · sagittal · 4.0mm · 0.81mm/px · 6 of 17 slices shown (1 of 2)]
[im 1/17]
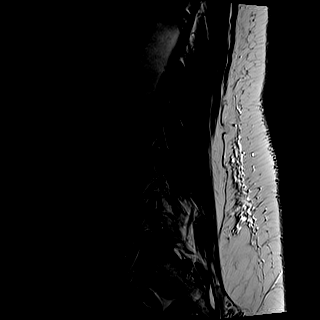
[im 4/17]
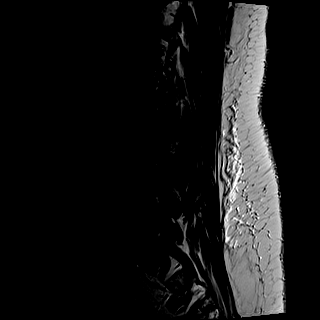
[im 7/17]
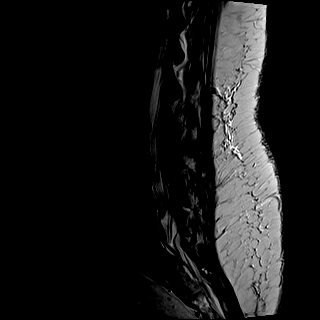
[im 10/17]
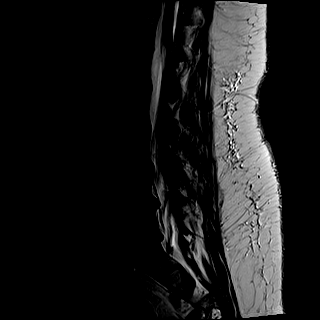
[im 13/17]
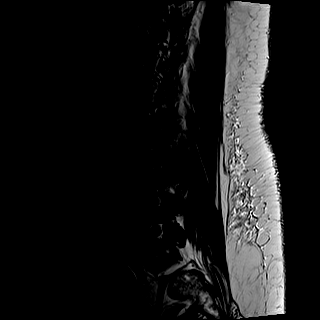
[im 17/17]
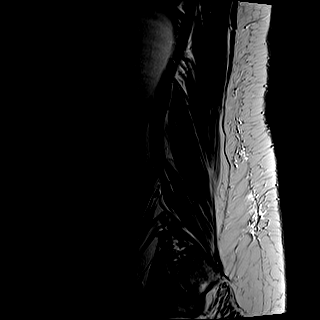

[Series 14: T1 · sagittal · 4.0mm · 0.81mm/px · 7 of 17 slices shown (1 of 2)]
[im 1/17]
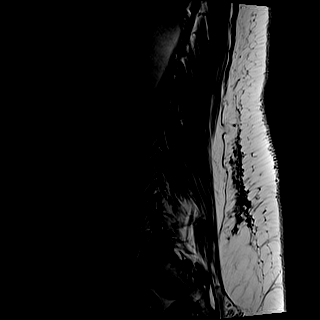
[im 3/17]
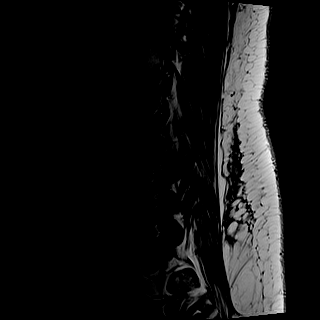
[im 6/17]
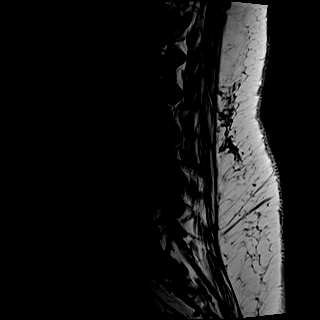
[im 9/17]
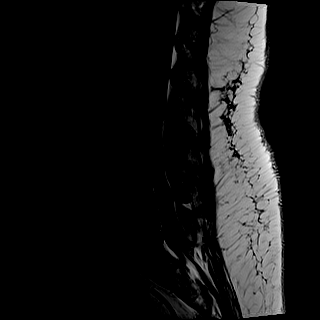
[im 11/17]
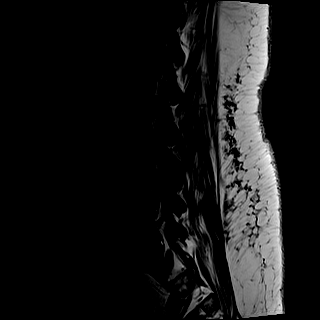
[im 14/17]
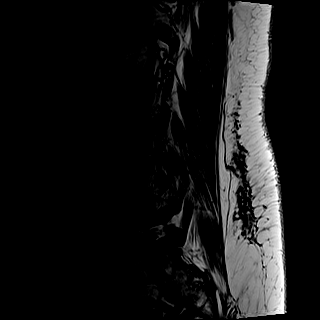
[im 17/17]
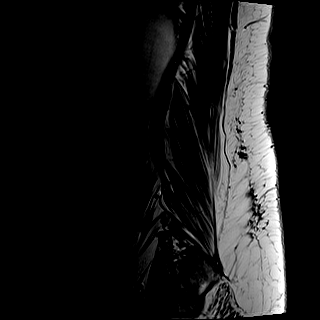

[Series 15: STIR · sagittal · 4.0mm · 0.41mm/px · 1 of 17 slices shown]
[im 1/17]
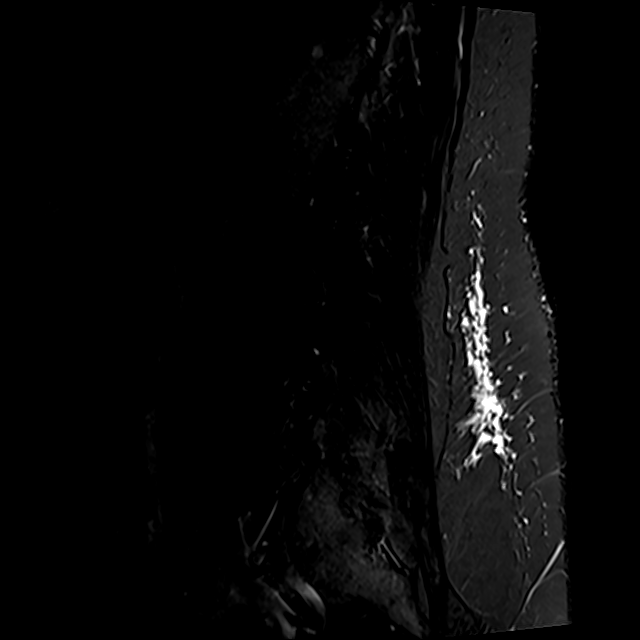

[Series 16: T2 · axial · 4.0mm · 0.78mm/px · z∈[-77,+126]mm · 8 of 36 slices shown (2 of 2)]
[im 1/36]
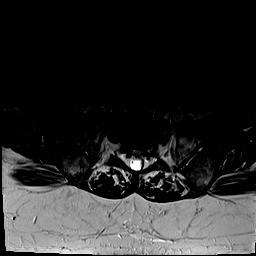
[im 6/36]
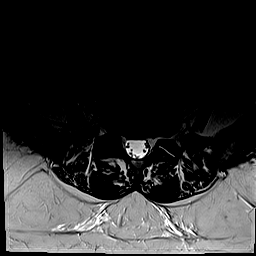
[im 11/36]
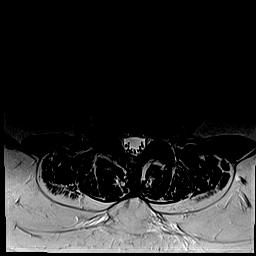
[im 17/36]
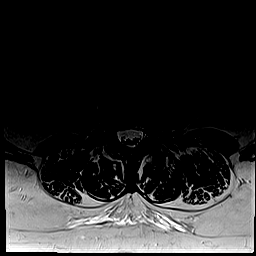
[im 19/36]
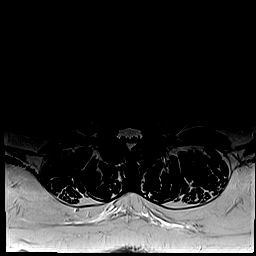
[im 25/36]
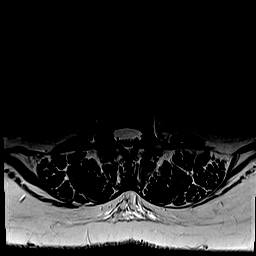
[im 30/36]
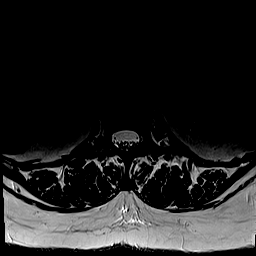
[im 36/36]
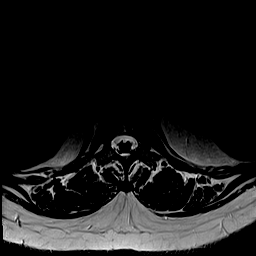

[Series 17: T1 · axial · 4.0mm · 0.39mm/px · z∈[-77,+126]mm · 8 of 36 slices shown (2 of 2)]
[im 1/36]
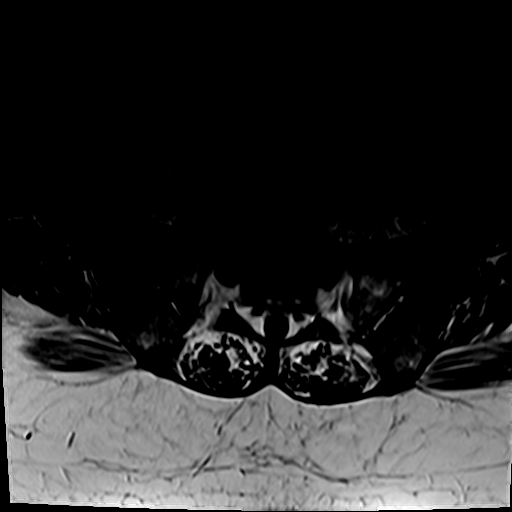
[im 6/36]
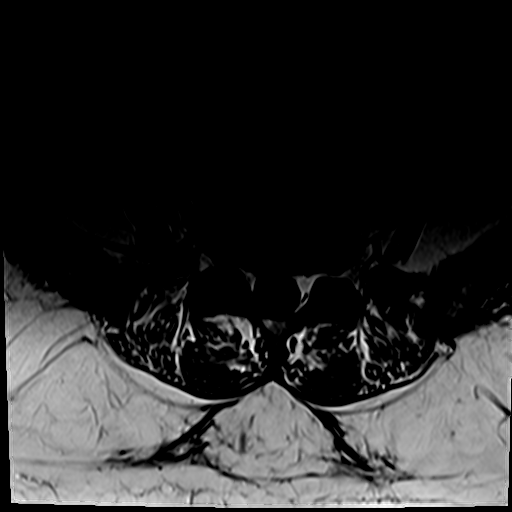
[im 11/36]
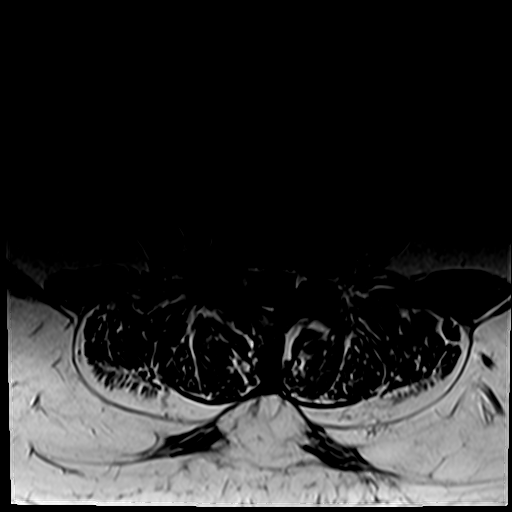
[im 17/36]
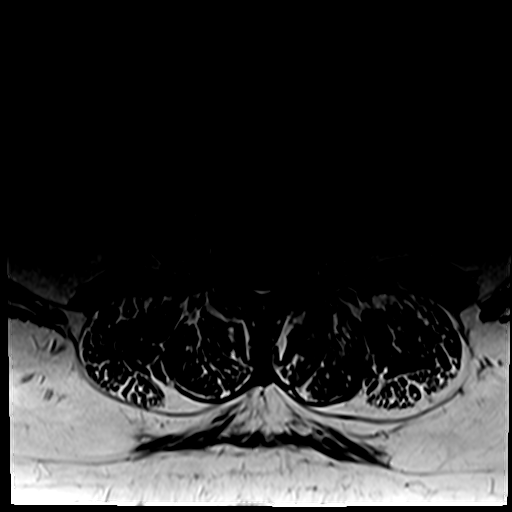
[im 19/36]
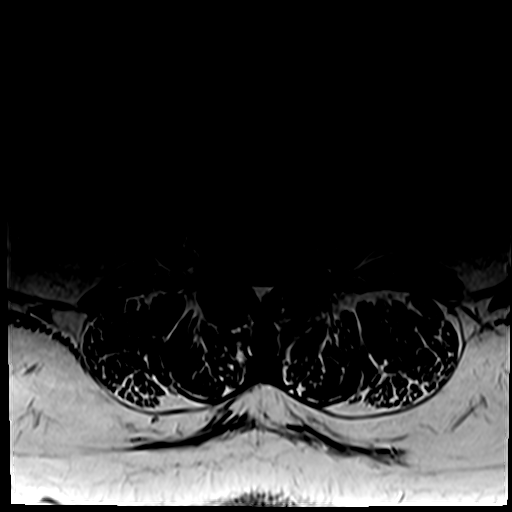
[im 25/36]
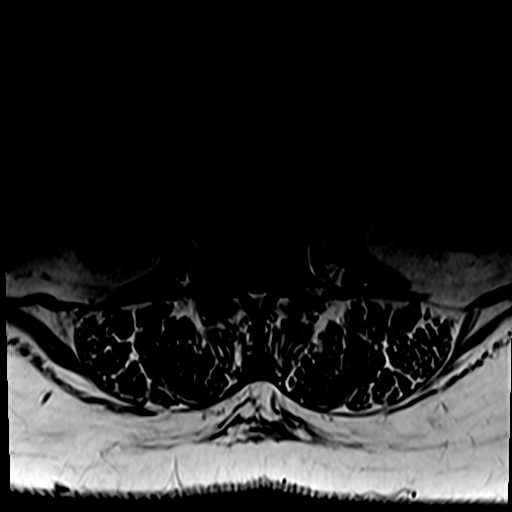
[im 30/36]
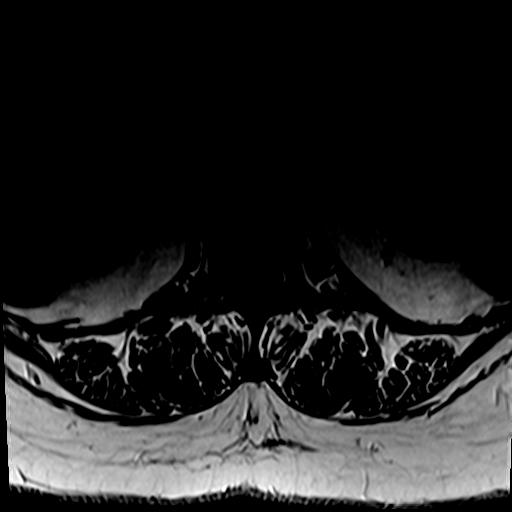
[im 36/36]
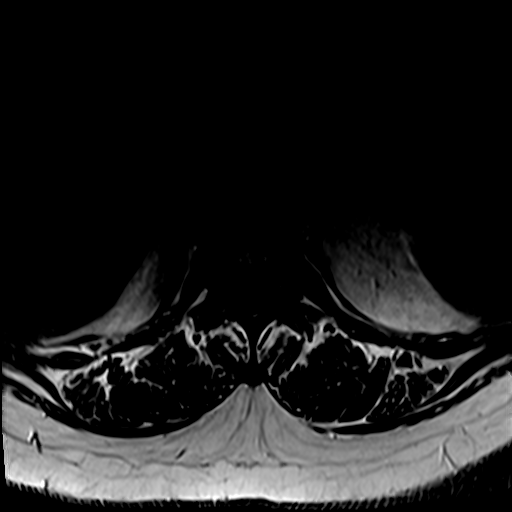

[30 of 48 positions shown; findings below may reference images not displayed]

FINDINGS: Segmentation:  Standard.

Alignment: Trace anterolisthesis of L3 on L4, stable to minimally
increased. Trace retrolisthesis of L5 on S1.

Vertebrae: No fracture or suspicious osseous lesion. Minimal
right-sided degenerative endplate edema at L5-S1. Scattered
vertebral hemangiomas, largest at T12.

Conus medullaris and cauda equina: Conus extends to the upper L1
level. Conus and cauda equina appear normal.

Paraspinal and other soft tissues: Partially visualized small T2
hyperintense lesions in both kidneys, similar to the prior study and
likely reflecting cysts. Partially visualized 5 mm T2 hyperintense
lesion in the posterior right hepatic lobe, also possibly a cyst or
hemangioma but incompletely evaluated.

Disc levels:

Disc desiccation throughout the lumbar spine. Moderate disc space
narrowing at L3-4 and severe narrowing at L4-5.

T12-L1 and L1-2: Negative.

L2-3: Mild disc bulging, a small right foraminal disc extrusion, and
slight facet hypertrophy result in minimal right neural foraminal
stenosis without significant spinal stenosis, unchanged.

L3-4: Circumferential disc bulging and mild facet hypertrophy result
in borderline to mild right greater than left neural foraminal
stenosis, stable to minimally increased. Dorsal epidural lipomatosis
mildly narrows the thecal sac.

L4-5: The left foraminal disc extrusion on the prior study has
greatly decreased in size though not completely resolved. The disc
extrusion, circumferential disc bulging, and endplate spurring
result in mild-to-moderate bilateral neural foraminal stenosis
without spinal stenosis.

L5-S1: Mild disc bulging, a new right foraminal disc protrusion, and
mild facet hypertrophy result in moderate to severe right neural
foraminal stenosis with suspected right L5 nerve root impingement.
No spinal stenosis.
IMPRESSION: 1. New right foraminal disc protrusion at L5-S1 resulting in severe
foraminal stenosis and L5 nerve root impingement.
2. Decreased size of left foraminal disc extrusion at L4-5.
3. Stable to minimally progressive disc degeneration at L3-4 with at
most mild neural foraminal stenosis.

## 2019-03-18 ENCOUNTER — Encounter: Payer: Self-pay | Admitting: Family Medicine

## 2019-03-18 ENCOUNTER — Other Ambulatory Visit: Payer: Self-pay

## 2019-03-18 ENCOUNTER — Ambulatory Visit (INDEPENDENT_AMBULATORY_CARE_PROVIDER_SITE_OTHER): Payer: Medicare HMO | Admitting: Family Medicine

## 2019-03-18 VITALS — BP 140/76 | HR 60 | Temp 98.3°F | Ht 62.0 in | Wt 248.0 lb

## 2019-03-18 DIAGNOSIS — F418 Other specified anxiety disorders: Secondary | ICD-10-CM

## 2019-03-18 DIAGNOSIS — R0902 Hypoxemia: Secondary | ICD-10-CM

## 2019-03-18 DIAGNOSIS — Z720 Tobacco use: Secondary | ICD-10-CM

## 2019-03-18 DIAGNOSIS — I2721 Secondary pulmonary arterial hypertension: Secondary | ICD-10-CM | POA: Diagnosis not present

## 2019-03-18 DIAGNOSIS — J449 Chronic obstructive pulmonary disease, unspecified: Secondary | ICD-10-CM

## 2019-03-18 DIAGNOSIS — I10 Essential (primary) hypertension: Secondary | ICD-10-CM

## 2019-03-18 DIAGNOSIS — G4733 Obstructive sleep apnea (adult) (pediatric): Secondary | ICD-10-CM

## 2019-03-18 DIAGNOSIS — M5417 Radiculopathy, lumbosacral region: Secondary | ICD-10-CM | POA: Diagnosis not present

## 2019-03-18 DIAGNOSIS — I5032 Chronic diastolic (congestive) heart failure: Secondary | ICD-10-CM | POA: Diagnosis not present

## 2019-03-18 NOTE — Progress Notes (Signed)
Patient: Theresa [name], Female    DOB: January 16, 1956, 63 y.o.   MRN: [id number] Visit Date: 03/18/2019  Today's Provider: [name], PA   Chief Complaint  Patient presents with  . Annual Exam   Subjective:  Theresa [name] is a 63 y.o. female who presents today for health maintenance and complete physical. She feels well. She reports exercising none. She reports she is sleeping poorly.  Past Medical History:  Diagnosis Date  . Acute postoperative pain 10/03/2017  . Anxiety   . BiPAP (biphasic positive airway pressure) dependence    at hs  . Carpal tunnel syndrome   . CHF (congestive heart failure) ([place])    1/18  . CHF (congestive heart failure) ([place])   . Chronic generalized abdominal pain   . Chronic rhinitis   . COPD (chronic obstructive pulmonary disease) ([place])   . DDD (degenerative disc disease), cervical   . DDD (degenerative disc disease), lumbosacral   . Depression   . Dyspnea   . Edema   . Flu    1/18  . Gastritis   . GERD (gastroesophageal reflux disease)   . Hematuria   . Hernia of abdominal wall 07/17/2018  . Hernia, abdominal   . Hypertension   . Kidney stones   . Low back pain   . Lumbar radiculitis   . Malodorous urine   . Muscle weakness   . Obesity   . Oxygen dependent   . Renal cyst   . Sensory urge incontinence   . Thyroid activity decreased    9/19  . Tobacco abuse   . Wheezing    Past Surgical History:  Procedure Laterality Date  . ABDOMINAL HYSTERECTOMY    . CARPAL TUNNEL RELEASE Left 2012  . EPIGASTRIC HERNIA REPAIR N/A 08/13/2018   HERNIA REPAIR EPIGASTRIC ADULT; polypropylene mesh reinforcement.  Surgeon: Robert Bellow, MD;  Location: [facility] ORS;  Service: General;  Laterality: N/A;  . TONSILLECTOMY    . TUBAL LIGATION    . UMBILICAL HERNIA REPAIR N/A 08/13/2018   HERNIA REPAIR UMBILICAL ADULT; polypropylene mesh reinforcement Surgeon: Robert Bellow, MD;  Location: [facility] ORS;  Service: General;  Laterality: N/A;   Family  History  Problem Relation Age of Onset  . Heart disease Mother   . Stroke Mother   . Coronary artery disease Mother   . Lung cancer Sister   . Cancer Sister   . Breast cancer Sister   . Alcohol abuse Father   . Heart disease Father   . Cancer Brother   . Cancer Brother   . Pneumonia Brother   . Prostate cancer Neg Hx   . Kidney cancer Neg Hx   . Bladder Cancer Neg Hx     Review of Systems  Constitutional: Positive for appetite change.  HENT: Negative.   Eyes: Positive for itching.  Respiratory: Positive for apnea, cough, chest tightness, shortness of breath and wheezing.   Cardiovascular: Positive for chest pain.  Gastrointestinal: Positive for abdominal distention.  Endocrine: Positive for polyphagia and polyuria.  Genitourinary: Positive for enuresis and frequency.  Musculoskeletal: Positive for back pain.  Allergic/Immunologic: Negative.   Neurological: Positive for tremors, weakness and headaches.  Hematological: Bruises/bleeds easily.  Psychiatric/Behavioral: Positive for agitation and sleep disturbance. The patient is nervous/anxious.    Patient states that all her symptoms are chronic and unchanged except abdominal bloating  Social History   Socioeconomic History  . Marital status: Married    Spouse name: Ovid Curd  . Number of children: 2  .  Years of education: Not on file  . Highest education level: 12th grade  Occupational History  . Occupation: disability  Social Needs  . Financial resource strain: Somewhat hard  . Food insecurity:    Worry: Never true    Inability: Never true  . Transportation needs:    Medical: No    Non-medical: No  Tobacco Use  . Smoking status: Current Every Day Smoker    Packs/day: 1.00    Years: 44.00    Pack years: 44.00    Types: Cigarettes  . Smokeless tobacco: Never Used  Substance and Sexual Activity  . Alcohol use: No    Frequency: Never  . Drug use: No  . Sexual activity: Not Currently    Birth control/protection:  Surgical  Lifestyle  . Physical activity:    Days per week: 0 days    Minutes per session: 0 min  . Stress: Rather much  Relationships  . Social connections:    Talks on phone: Patient refused    Gets together: Patient refused    Attends religious service: Patient refused    Active member of club or organization: Patient refused    Attends meetings of clubs or organizations: Patient refused    Relationship status: Patient refused  . Intimate partner violence:    Fear of current or ex partner: Patient refused    Emotionally abused: Patient refused    Physically abused: Patient refused    Forced sexual activity: Patient refused  Other Topics Concern  . Not on file  Social History Narrative   ** Merged History Encounter **       Lives with spouse    Her family history includes Alcohol abuse in her father; Breast cancer in her sister; Cancer in her brother, brother, and sister; Coronary artery disease in her mother; Heart disease in her father and mother; Lung cancer in her sister; Pneumonia in her brother; Stroke in her mother.     Outpatient Encounter Medications as of 03/18/2019  Medication Sig  . ALPRAZolam (XANAX) 0.5 MG tablet Take 1 tablet (0.5 mg total) by mouth 2 (two) times daily as needed for anxiety.  Marland Kitchen aspirin 81 MG tablet Take 81 mg by mouth daily.  . bisoprolol-hydrochlorothiazide (ZIAC) 10-6.25 MG tablet Take 1 tablet by mouth daily.  . Cholecalciferol ([employer] D3) 2000 units CAPS Take 2,000 Units by mouth daily.   Marland Kitchen doxycycline (VIBRA-TABS) 100 MG tablet Take 1 tablet (100 mg total) by mouth 2 (two) times daily. (Patient not taking: Reported on 01/21/2019)  . fluticasone (FLONASE SENSIMIST) 27.5 MCG/SPRAY nasal spray Place 2 sprays into the nose daily.  . Fluticasone-Umeclidin-Vilant 100-62.5-25 MCG/INH AEPB Inhale 1 puff into the lungs daily.  Marland Kitchen gabapentin (NEURONTIN) 300 MG capsule Take 1 capsule (300 mg total) by mouth 4 (four) times daily.  Derrill Memo ON 04/18/2019]  HYDROcodone-acetaminophen (NORCO/VICODIN) 5-325 MG tablet Take 1 tablet by mouth every 8 (eight) hours as needed for up to 30 days for severe pain.  Derrill Memo ON 03/19/2019] HYDROcodone-acetaminophen (NORCO/VICODIN) 5-325 MG tablet Take 1 tablet by mouth every 8 (eight) hours as needed for up to 30 days for severe pain.  Marland Kitchen HYDROcodone-acetaminophen (NORCO/VICODIN) 5-325 MG tablet Take 1 tablet by mouth every 8 (eight) hours as needed for up to 30 days for severe pain.  Marland Kitchen ibuprofen (ADVIL,MOTRIN) 200 MG tablet Take 1,200 mg by mouth every 6 (six) hours as needed for headache or moderate pain.  Marland Kitchen ipratropium-albuterol (DUONEB) 0.5-2.5 (3) MG/3ML SOLN USE 1  VIAL VIA NEBULIZER EVERY 6 HOURS AS NEEDED FOR SHORTNESS OF BREATH OR WHEEZING  . levothyroxine (SYNTHROID, LEVOTHROID) 25 MCG tablet Take 25 mcg by mouth daily before breakfast. DUPLICATE ENTRY  . metolazone (ZAROXOLYN) 5 MG tablet Take 1 tablet (5 mg total) by mouth daily. Take 1/2 hour prior to torsemide  . OXYGEN Inhale 2.5 L into the lungs continuous. Reported on 11/03/2015  . potassium chloride SA (K-DUR,KLOR-CON) 20 MEQ tablet Take 1 tablet (20 mEq total) by mouth 2 (two) times daily.  . ranitidine (ZANTAC) 150 MG tablet Take 150 mg by mouth 2 (two) times daily.  . sertraline (ZOLOFT) 100 MG tablet TAKE 2 TABLETS(200 MG) BY MOUTH EVERY MORNING  . torsemide (DEMADEX) 20 MG tablet Take 2 tablets (40 mg total) by mouth 2 (two) times daily for 30 days.  . VENTOLIN HFA 108 (90 Base) MCG/ACT inhaler Inhale 2 puffs into the lungs every 6 (six) hours as needed for shortness of breath.   No facility-administered encounter medications on file as of 03/18/2019.     Patient Care Team: Courtez Twaddle, Vickki Muff, PA as PCP - General (Family Medicine) [name], FNP as Nurse Practitioner (Cardiology) Yolonda Kida, MD as Consulting Physician (Cardiology) Erby Pian, MD as Referring Physician (Pulmonary Disease) Harlin Heys, MD as Consulting  Physician (Obstetrics and Gynecology) Milinda Pointer, MD as Referring Physician (Pain Medicine) Idelle Leech, OD as Consulting Physician (Optometry)      Objective:   Vitals:  Vitals:   03/18/19 1350  BP: 140/76  Pulse: 60  Temp: 98.3 F (36.8 C)  TempSrc: Oral  SpO2: 94%  Weight: 248 lb (112.5 kg)  Height: _0  (1.575 m)   Wt Readings from Last 3 Encounters:  03/18/19 248 lb (112.5 kg)  01/04/19 247 lb (112 kg)  12/13/18 240 lb (108.9 kg)   Physical Exam Constitutional:      Appearance: She is well-developed. She is obese.     Comments: Dyspneic.  HENT:     Head: Normocephalic and atraumatic.     Right Ear: External ear normal.     Left Ear: External ear normal.     Nose: Nose normal.  Eyes:     General:        Right eye: No discharge.     Conjunctiva/sclera: Conjunctivae normal.     Pupils: Pupils are equal, round, and reactive to light.  Neck:     Musculoskeletal: Normal range of motion and neck supple.     Thyroid: No thyromegaly.     Trachea: No tracheal deviation.  Cardiovascular:     Rate and Rhythm: Normal rate and regular rhythm.     Heart sounds: Normal heart sounds. No murmur.  Pulmonary:     Effort: No respiratory distress.     Breath sounds: Wheezing present. No rales.     Comments: Respiratory distress if she tries to walk very far (100 ft.). Pulse oximetry can fall to 79% if she tries to walk without the oxygen and it may take an hour or two to get back up in the 90's. No rales. Few rhonchi. Chest:     Chest wall: No tenderness.  Abdominal:     General: There is no distension.     Palpations: Abdomen is soft. There is no mass.     Tenderness: There is no abdominal tenderness. There is no guarding or rebound.  Musculoskeletal: Normal range of motion.        General: No tenderness.  Lymphadenopathy:     Cervical: No cervical adenopathy.  Skin:    General: Skin is warm and dry.     Findings: No erythema or rash.  Neurological:      Mental Status: She is alert and oriented to person, place, and time.     Cranial Nerves: No cranial nerve deficit.     Motor: No abnormal muscle tone.     Coordination: Coordination normal.     Deep Tendon Reflexes: Reflexes are normal and symmetric. Reflexes normal.  Psychiatric:        Behavior: Behavior normal.        Thought Content: Thought content normal.        Judgment: Judgment normal.     Depression Screen PHQ 2/9 Scores 01/21/2019 12/13/2018 07/31/2018 07/09/2018  PHQ - 2 Score 1 0 1 1  PHQ- 9 Score - - - -  Exception Documentation - - Patient refusal -    Assessment & Plan:     Routine Health Maintenance and Physical Exam  Exercise Activities and Dietary recommendations Goals    . Diet     Recommend current diet plan of eating baked foods, avoiding sugars and drinking lots of water.     Marland Kitchen LIFESTYLE - DECREASE FALLS RISK     Recommend to remove any items from the home that may cause slips or trips.       Immunization History  Administered Date(s) Administered  . Influenza Split 09/02/2014  . Influenza,inj,Quad PF,6+ Mos 07/01/2015, 08/24/2017, 07/13/2018  . Influenza-Unspecified 08/29/2016  . Pneumococcal Polysaccharide-23 07/01/2015, 11/03/2016  . Pneumococcal-Unspecified 07/24/2011    Health Maintenance  Topic Date Due  . TETANUS/TDAP  05/22/1976  . INFLUENZA VACCINE  05/18/2019  . MAMMOGRAM  06/22/2020  . PAP SMEAR-Modifier  04/13/2021  . COLONOSCOPY  01/22/2024  . Hepatitis C Screening  Completed  . HIV Screening  Completed     Discussed health benefits of physical activity, and encouraged her to engage in regular exercise appropriate for her age and condition.   1. Chronic diastolic heart failure ([place]) Denies recent peripheral edema. Usually notices abdominal bloating when flares occur. Uses Torsemide 20 mg up to 2 tablets BID and occasionally has to add Zaroxolyn 5 mg 1 tablets 1/2 hour prior to Torsemide and K-Dur 20 meq BID. Followed by Dr.  Clayborn Bigness (cardiologist) and Darylene Price, FNP ([facility]). Monitors weight for changes indicating excessive fluid retention. Recommend sodium restriction diet. Recheck CBC, CMP and Lipid Panel. Follow up pending lab reports. - CBC with Differential/Platelet; Future - Comprehensive metabolic panel; Future - Lipid panel; Future  2. Essential (primary) hypertension Tolerating Ziac 10-6.25 qd with fair control. Recheck routine labs and continue Levothyroxine 25 mcg qd. - CBC with Differential/Platelet; Future - Comprehensive metabolic panel; Future - Lipid panel; Future  3. 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[place]) Followed by Dr. Clayborn Bigness (cardiologist) and last seen 10-25-18. Advised to follow up with pulmonologist and maintain oxygen supplementation with BP control. Recheck CBC and CMP. - CBC with Differential/Platelet; Future - Comprehensive metabolic panel; Future  4. COPD with hypoxia ([place]) Still on oxygen by nasal cannula at 2-2.5 LPM. Was seen by Dr. Raul Del (pulmonologist) on 02-05-19 with pulse oximetry 91% on 2 LPM oxygen. He changed the Advair and Spiriva to Trelegy 1 inhalation qd, Ventolin-HFA 2 puffs qid prn. rescue. Rarely uses the Duoneb by a nebulizer at home. Continue present regimen and follow up with pulmonologist as planned. - CBC with Differential/Platelet; Future  5. Obstructive sleep apnea syndrome  Presently using BiPAP at night with oxygen bleed-in. Sleeping fairly well and breathing easier. Occasionally will use BiPAP during a daytime nap.  6. Lumbosacral radiculopathy at L4 Chronic low back pain with radiation to the left leg and history of facet arthropathy, foramen stenosis (L4-L5) and spondylosis. Using gabapentin 300 mg QID and Norco 5-325 mg 1 TID prn per Dr. Dossie Arbour (pain management).   7. Tobacco abuse Still smoking <1 ppd. Encouraged to stop all smoking but she does not think she can.  8. Depression with anxiety Refused PHQ-9 in the past. Still taking  the Zoloft 200 mg each morning and cardiologist gave a little Alprazolam 0.5 mg to use prn once or twice a day (don't want to use it much). Some occasional crying spells when remembering her daughter (especially mother's day). Denies suicidal ideation. Check routine labs and follow up pending reports. - CBC with Differential/Platelet; Future - Comprehensive metabolic panel; Future - TSH; Future

## 2019-03-19 ENCOUNTER — Telehealth: Payer: Self-pay | Admitting: *Deleted

## 2019-03-19 DIAGNOSIS — Z122 Encounter for screening for malignant neoplasm of respiratory organs: Secondary | ICD-10-CM

## 2019-03-19 DIAGNOSIS — Z87891 Personal history of nicotine dependence: Secondary | ICD-10-CM

## 2019-03-19 NOTE — Telephone Encounter (Signed)
Patient has been notified that annual lung cancer screening low dose CT scan is due currently or will be in near future. Confirmed that patient is within the age range of 55-77, and asymptomatic, (no signs or symptoms of lung cancer). Patient denies illness that would prevent curative treatment for lung cancer if found. Verified smoking history, (current, 45 pack year). The shared decision making visit was done 02/01/18. Patient is agreeable for CT scan being scheduled.

## 2019-03-23 DIAGNOSIS — J9610 Chronic respiratory failure, unspecified whether with hypoxia or hypercapnia: Secondary | ICD-10-CM | POA: Diagnosis not present

## 2019-03-25 ENCOUNTER — Ambulatory Visit
Admission: RE | Admit: 2019-03-25 | Discharge: 2019-03-25 | Disposition: A | Discharge status: Home / Self Care | Payer: Medicare HMO | Source: Ambulatory Visit | Attending: Oncology | Admitting: Oncology

## 2019-03-25 ENCOUNTER — Other Ambulatory Visit: Payer: Self-pay

## 2019-03-25 DIAGNOSIS — Z122 Encounter for screening for malignant neoplasm of respiratory organs: Secondary | ICD-10-CM | POA: Diagnosis not present

## 2019-03-25 DIAGNOSIS — Z87891 Personal history of nicotine dependence: Secondary | ICD-10-CM | POA: Diagnosis not present

## 2019-03-25 DIAGNOSIS — F17210 Nicotine dependence, cigarettes, uncomplicated: Secondary | ICD-10-CM | POA: Diagnosis not present

## 2019-03-27 ENCOUNTER — Encounter: Payer: Self-pay | Admitting: *Deleted

## 2019-04-01 DIAGNOSIS — J9610 Chronic respiratory failure, unspecified whether with hypoxia or hypercapnia: Secondary | ICD-10-CM | POA: Diagnosis not present

## 2019-04-04 DIAGNOSIS — J9610 Chronic respiratory failure, unspecified whether with hypoxia or hypercapnia: Secondary | ICD-10-CM | POA: Diagnosis not present

## 2019-04-15 ENCOUNTER — Encounter: Payer: Self-pay | Admitting: Obstetrics and Gynecology

## 2019-04-16 ENCOUNTER — Encounter: Payer: Medicare HMO | Admitting: Obstetrics and Gynecology

## 2019-04-17 ENCOUNTER — Encounter: Payer: Self-pay | Admitting: Family

## 2019-04-17 ENCOUNTER — Ambulatory Visit
Admission: RE | Admit: 2019-04-17 | Discharge: 2019-04-17 | Disposition: A | Discharge status: Home / Self Care | Payer: Medicare HMO | Source: Ambulatory Visit | Attending: Family | Admitting: Family

## 2019-04-17 ENCOUNTER — Other Ambulatory Visit: Payer: Self-pay | Admitting: Family

## 2019-04-17 ENCOUNTER — Other Ambulatory Visit: Payer: Self-pay

## 2019-04-17 ENCOUNTER — Ambulatory Visit: Payer: Medicare HMO | Admitting: Family

## 2019-04-17 VITALS — BP 109/53 | HR 67 | Resp 18 | Ht 62.0 in | Wt 253.5 lb

## 2019-04-17 DIAGNOSIS — Z803 Family history of malignant neoplasm of breast: Secondary | ICD-10-CM | POA: Insufficient documentation

## 2019-04-17 DIAGNOSIS — Z8249 Family history of ischemic heart disease and other diseases of the circulatory system: Secondary | ICD-10-CM | POA: Insufficient documentation

## 2019-04-17 DIAGNOSIS — Z885 Allergy status to narcotic agent status: Secondary | ICD-10-CM | POA: Diagnosis not present

## 2019-04-17 DIAGNOSIS — F329 Major depressive disorder, single episode, unspecified: Secondary | ICD-10-CM | POA: Insufficient documentation

## 2019-04-17 DIAGNOSIS — F17210 Nicotine dependence, cigarettes, uncomplicated: Secondary | ICD-10-CM | POA: Insufficient documentation

## 2019-04-17 DIAGNOSIS — J449 Chronic obstructive pulmonary disease, unspecified: Secondary | ICD-10-CM | POA: Insufficient documentation

## 2019-04-17 DIAGNOSIS — Z7982 Long term (current) use of aspirin: Secondary | ICD-10-CM | POA: Insufficient documentation

## 2019-04-17 DIAGNOSIS — E669 Obesity, unspecified: Secondary | ICD-10-CM | POA: Insufficient documentation

## 2019-04-17 DIAGNOSIS — I110 Hypertensive heart disease with heart failure: Secondary | ICD-10-CM | POA: Diagnosis not present

## 2019-04-17 DIAGNOSIS — E039 Hypothyroidism, unspecified: Secondary | ICD-10-CM | POA: Insufficient documentation

## 2019-04-17 DIAGNOSIS — Z720 Tobacco use: Secondary | ICD-10-CM

## 2019-04-17 DIAGNOSIS — Z9981 Dependence on supplemental oxygen: Secondary | ICD-10-CM | POA: Diagnosis not present

## 2019-04-17 DIAGNOSIS — Z79899 Other long term (current) drug therapy: Secondary | ICD-10-CM | POA: Insufficient documentation

## 2019-04-17 DIAGNOSIS — F419 Anxiety disorder, unspecified: Secondary | ICD-10-CM | POA: Insufficient documentation

## 2019-04-17 DIAGNOSIS — Z809 Family history of malignant neoplasm, unspecified: Secondary | ICD-10-CM | POA: Insufficient documentation

## 2019-04-17 DIAGNOSIS — Z801 Family history of malignant neoplasm of trachea, bronchus and lung: Secondary | ICD-10-CM | POA: Diagnosis not present

## 2019-04-17 DIAGNOSIS — J410 Simple chronic bronchitis: Secondary | ICD-10-CM

## 2019-04-17 DIAGNOSIS — Z886 Allergy status to analgesic agent status: Secondary | ICD-10-CM | POA: Diagnosis not present

## 2019-04-17 DIAGNOSIS — I5033 Acute on chronic diastolic (congestive) heart failure: Secondary | ICD-10-CM

## 2019-04-17 DIAGNOSIS — Z79890 Hormone replacement therapy (postmenopausal): Secondary | ICD-10-CM | POA: Insufficient documentation

## 2019-04-17 DIAGNOSIS — I10 Essential (primary) hypertension: Secondary | ICD-10-CM

## 2019-04-17 DIAGNOSIS — Z6841 Body Mass Index (BMI) 40.0 and over, adult: Secondary | ICD-10-CM | POA: Diagnosis not present

## 2019-04-17 LAB — BASIC METABOLIC PANEL
Anion gap: 10 (ref 5–15)
BUN: 25 mg/dL — ABNORMAL HIGH (ref 8–23)
CO2: 24 mmol/L (ref 22–32)
Calcium: 8.5 mg/dL — ABNORMAL LOW (ref 8.9–10.3)
Chloride: 107 mmol/L (ref 98–111)
Creatinine, Ser: 0.97 mg/dL (ref 0.44–1.00)
GFR calc Af Amer: >60 mL/min (ref >60)
GFR calc non Af Amer: >60 mL/min (ref >60)
Glucose, Bld: 99 mg/dL (ref 70–99)
Potassium: 3.7 mmol/L (ref 3.5–5.1)
Sodium: 141 mmol/L (ref 135–145)

## 2019-04-17 LAB — BRAIN NATRIURETIC PEPTIDE: B Natriuretic Peptide: 301 pg/mL — ABNORMAL HIGH (ref 0.0–100.0)

## 2019-04-17 MED ORDER — FUROSEMIDE 10 MG/ML IJ SOLN
80.0000 mg | Freq: Once | INTRAMUSCULAR | Status: AC
  Administered 2019-04-17: 80 mg via INTRAVENOUS

## 2019-04-17 MED ORDER — FUROSEMIDE 10 MG/ML IJ SOLN
INTRAMUSCULAR | Status: AC
  Filled 2019-04-17: qty 8

## 2019-04-17 MED ORDER — SODIUM CHLORIDE FLUSH 0.9 % IV SOLN
INTRAVENOUS | Status: AC
  Filled 2019-04-17: qty 20

## 2019-04-17 MED ORDER — POTASSIUM CHLORIDE CRYS ER 20 MEQ PO TBCR
EXTENDED_RELEASE_TABLET | ORAL | Status: AC
  Filled 2019-04-17: qty 2

## 2019-04-17 MED ORDER — POTASSIUM CHLORIDE CRYS ER 20 MEQ PO TBCR
40.0000 meq | EXTENDED_RELEASE_TABLET | Freq: Once | ORAL | Status: AC
  Administered 2019-04-17: 40 meq via ORAL

## 2019-04-17 NOTE — Patient Instructions (Signed)
Continue weighing daily and call for an overnight weight gain of > 2 pounds or a weekly weight gain of >5 pounds. 

## 2019-04-17 NOTE — Progress Notes (Signed)
Patient ID: Theresa [name], female    DOB: [birth year], 63 y.o.   MRN: [id number]  HPI Theresa [name] is a 63 y/o female with a history of chronic low back pain, HTN, depression, COPD on long-term oxygen, anxiety, carpal tunnel syndrome, long-standing tobacco use and chronic heart failure.  Reviewed echo report on 12/11/17 which showed an EF of 55-60% along with trivial AR and an elevated PA pressure of 58 mm Hg. Echo done 11/03/15 but unable to access results. Echo done on 02/19/15 showed an EF of 50% without valvular stenosis. Recent PFT's done on 01/18/16  Has not been admitted or been in the ED in the last 6 months. Did have outpatient ventral hernia repair done 08/13/18.    She presents today for a follow-up visit with a chief complaint of moderate shortness of breath upon minimal exertion. She describes this as chronic in nature having been present for several years although she does feel more short of breath over the last week or so. She has associated fatigue, cough, wheezing, chest pain, abdominal distention, difficulty sleeping and weight gain along with this. She denies any dizziness, palpitations or pedal edema.   Past Medical History:  Diagnosis Date  . Acute postoperative pain 10/03/2017  . Anxiety   . BiPAP (biphasic positive airway pressure) dependence    at hs  . Carpal tunnel syndrome   . CHF (congestive heart failure) ([place])    1/18  . CHF (congestive heart failure) ([place])   . Chronic generalized abdominal pain   . Chronic rhinitis   . COPD (chronic obstructive pulmonary disease) ([place])   . DDD (degenerative disc disease), cervical   . DDD (degenerative disc disease), lumbosacral   . Depression   . Dyspnea   . Edema   . Flu    1/18  . Gastritis   . GERD (gastroesophageal reflux disease)   . Hematuria   . Hernia of abdominal wall 07/17/2018  . Hernia, abdominal   . Hypertension   . Kidney stones   . Low back pain   . Lumbar radiculitis   . Malodorous urine   . Muscle  weakness   . Obesity   . Oxygen dependent   . Renal cyst   . Sensory urge incontinence   . Thyroid activity decreased    9/19  . Tobacco abuse   . Wheezing    Past Surgical History:  Procedure Laterality Date  . ABDOMINAL HYSTERECTOMY    . CARPAL TUNNEL RELEASE Left 2012  . EPIGASTRIC HERNIA REPAIR N/A 08/13/2018   HERNIA REPAIR EPIGASTRIC ADULT; polypropylene mesh reinforcement.  Surgeon: Robert Bellow, MD;  Location: [facility] ORS;  Service: General;  Laterality: N/A;  . TONSILLECTOMY    . TUBAL LIGATION    . UMBILICAL HERNIA REPAIR N/A 08/13/2018   HERNIA REPAIR UMBILICAL ADULT; polypropylene mesh reinforcement Surgeon: Robert Bellow, MD;  Location: [facility] ORS;  Service: General;  Laterality: N/A;   Family History  Problem Relation Age of Onset  . Heart disease Mother   . Stroke Mother   . Coronary artery disease Mother   . Lung cancer Sister   . Cancer Sister   . Breast cancer Sister   . Alcohol abuse Father   . Heart disease Father   . Cancer Brother   . Cancer Brother   . Pneumonia Brother   . Prostate cancer Neg Hx   . Kidney cancer Neg Hx   . Bladder Cancer Neg Hx    Social History  Tobacco Use  . Smoking status: Current Every Day Smoker    Packs/day: 1.00    Years: 44.00    Pack years: 44.00    Types: Cigarettes  . Smokeless tobacco: Never Used  Substance Use Topics  . Alcohol use: No    Frequency: Never   Allergies  Allergen Reactions  . Codeine Nausea And Vomiting  . Meloxicam Other (See Comments)    Stomach pain   Prior to Admission medications   Medication Sig Start Date End Date Taking? Authorizing Provider  ALPRAZolam Duanne Moron) 0.5 MG tablet Take 1 tablet (0.5 mg total) by mouth 2 (two) times daily as needed for anxiety. 12/09/16  Yes Darylene Price A, FNP  aspirin 81 MG tablet Take 81 mg by mouth daily.   Yes [provider]  bisoprolol-hydrochlorothiazide (ZIAC) 10-6.25 MG tablet Take 1 tablet by mouth daily. 05/02/18  Yes Jerrol Banana., MD  Cholecalciferol ([employer] D3) 2000 units CAPS Take 2,000 Units by mouth daily.    Yes [provider]  fluticasone (FLONASE SENSIMIST) 27.5 MCG/SPRAY nasal spray Place 2 sprays into the nose daily. 01/04/19  Yes Mar Daring, PA-C  Fluticasone-Umeclidin-Vilant 100-62.5-25 MCG/INH AEPB Inhale 1 puff into the lungs daily. 10/03/18  Yes [provider]  gabapentin (NEURONTIN) 300 MG capsule Take 1 capsule (300 mg total) by mouth 4 (four) times daily. 02/04/19 05/05/19 Yes Vevelyn Francois, NP  HYDROcodone-acetaminophen (NORCO/VICODIN) 5-325 MG tablet Take 1 tablet by mouth every 8 (eight) hours as needed for up to 30 days for severe pain. 04/18/19 05/18/19 Yes Vevelyn Francois, NP  HYDROcodone-acetaminophen (NORCO/VICODIN) 5-325 MG tablet Take 1 tablet by mouth every 8 (eight) hours as needed for up to 30 days for severe pain. 03/19/19 04/18/19 Yes King, Diona Foley, NP  ibuprofen (ADVIL,MOTRIN) 200 MG tablet Take 1,200 mg by mouth every 6 (six) hours as needed for headache or moderate pain.   Yes [provider]  ipratropium-albuterol (DUONEB) 0.5-2.5 (3) MG/3ML SOLN USE 1 VIAL VIA NEBULIZER EVERY 6 HOURS AS NEEDED FOR SHORTNESS OF BREATH OR WHEEZING 11/30/18  Yes Chrismon, Vickki Muff, PA  levothyroxine (SYNTHROID, LEVOTHROID) 25 MCG tablet Take 25 mcg by mouth daily before breakfast. DUPLICATE ENTRY   Yes [provider]  metolazone (ZAROXOLYN) 5 MG tablet Take 1 tablet (5 mg total) by mouth daily. Take 1/2 hour prior to torsemide 03/07/19 06/05/19 Yes Brooks Stotz, [place] A, FNP  OXYGEN Inhale 2.5 L into the lungs continuous. Reported on 11/03/2015   Yes [provider]  potassium chloride SA (K-DUR,KLOR-CON) 20 MEQ tablet Take 1 tablet (20 mEq total) by mouth 2 (two) times daily. 08/31/18  Yes Darylene Price A, FNP  sertraline (ZOLOFT) 100 MG tablet TAKE 2 TABLETS(200 MG) BY MOUTH EVERY MORNING 11/16/18  Yes Chrismon, Vickki Muff, PA  torsemide (DEMADEX) 20 MG tablet  Take 2 tablets (40 mg total) by mouth 2 (two) times daily for 30 days. 03/07/19 04/17/19 Yes Sybrina Laning, Aura Fey, FNP  VENTOLIN HFA 108 (90 Base) MCG/ACT inhaler Inhale 2 puffs into the lungs every 6 (six) hours as needed for shortness of breath. 02/10/18  Yes Fritzi Mandes, MD    Review of Systems  Constitutional: Positive for fatigue. Negative for appetite change.  HENT: Negative for congestion, postnasal drip and sore throat.   Eyes: Negative.   Respiratory: Positive for cough, shortness of breath (moderate) and wheezing. Negative for chest tightness.   Cardiovascular: Positive for chest pain (at times). Negative for palpitations and leg  swelling.  Gastrointestinal: Positive for abdominal distention. Negative for abdominal pain and nausea.  Endocrine: Negative.   Genitourinary: Negative.   Musculoskeletal: Positive for back pain (chronic). Negative for neck pain.  Skin: Negative.   Allergic/Immunologic: Negative.   Neurological: Negative for dizziness and light-headedness.  Hematological: Negative for adenopathy. Bruises/bleeds easily.  Psychiatric/Behavioral: Positive for dysphoric mood and sleep disturbance (wearing oxygen & bipap at night). Negative for agitation and suicidal ideas. The patient is nervous/anxious.    Vitals:   04/17/19 1032  BP: (!) 109/53  Pulse: 67  Resp: 18  SpO2: 96%  Weight: 253 lb 8 oz (115 kg)  Height: _0  (1.575 m)   Wt Readings from Last 3 Encounters:  04/17/19 253 lb 8 oz (115 kg)  03/25/19 247 lb (112 kg)  03/18/19 248 lb (112.5 kg)   Lab Results  Component Value Date   CREATININE 0.82 11/16/2018   CREATININE 0.83 07/31/2018   CREATININE 0.70 03/01/2018     Physical Exam  Constitutional: She is oriented to person, place, and time. She appears well-developed and well-nourished.  HENT:  Head: Normocephalic and atraumatic.  Neck: Normal range of motion. Neck supple. No JVD present.  Cardiovascular: Normal rate and regular rhythm.   Pulmonary/Chest: Effort normal. She has no wheezes. She has rhonchi in the right upper field and the left upper field. She has rales in the right lower field, the left upper field and the left lower field.  Abdominal: Soft. She exhibits distension. There is no abdominal tenderness.  Musculoskeletal:        General: No tenderness or edema.  Neurological: She is alert and oriented to person, place, and time.  Skin: Skin is warm and dry.  Psychiatric: Her behavior is normal. Thought content normal. Her mood appears anxious. She does not exhibit a depressed mood.  Nursing note and vitals reviewed.     Assessment & Plan:  1: Acute on Chronic heart failure with preserved ejection fraction- - NYHA class III - fluid overloaded today with weight gain, abdominal distention and rales - continues to weigh daily at home & says that her weight has gradually risen. Reminded to call for an overnight weight gain of >2 pounds or a weekly weight gain of >5 pounds - weight up 8 pounds from last visit 6 months ago - will send patient for 16m IV lasix/ 462m PO potassium - will also get BMP/BNP drawn today - not adding salt and is trying to eat low sodium foods - saw cardiologist (CClayborn Bigness1/06/2019 & returns sometime this month (July) - BNP 12/10/17 was 333.4 - since HF provider will be out of the office next week, scheduled a f/u with her PCP for 04/23/2019  2: HTN- - BP looks good today - saw PCP (Chrismon) 03/18/2019 - BMP from 11/16/2018 reviewed and showed sodium 142, potassium 4.1, creatinine 0.82 and GFR 77  3: COPD- - wearing bipap nightly - oxygen at 2.5L at bedtime and then as needed during the day - last saw pulmonology (FRaul Delon 02/05/2019 - using nebulizer  4: Tobacco use- - smoking 1 ppd cigarettes - removes herself from the oxygen when smoking - complete cessation discussed for 3 minutes with her  Patient did not bring her medications nor a list. Each medication was verbally reviewed  with the patient and she was encouraged to bring the bottles to every visit to confirm accuracy of list.  Return in 1 month or sooner for any questions/problems before then.

## 2019-04-22 DIAGNOSIS — J9610 Chronic respiratory failure, unspecified whether with hypoxia or hypercapnia: Secondary | ICD-10-CM | POA: Diagnosis not present

## 2019-04-23 ENCOUNTER — Telehealth (INDEPENDENT_AMBULATORY_CARE_PROVIDER_SITE_OTHER): Payer: Medicare HMO | Admitting: Family Medicine

## 2019-04-23 ENCOUNTER — Encounter: Payer: Self-pay | Admitting: Family Medicine

## 2019-04-23 DIAGNOSIS — R0902 Hypoxemia: Secondary | ICD-10-CM | POA: Diagnosis not present

## 2019-04-23 DIAGNOSIS — R609 Edema, unspecified: Secondary | ICD-10-CM | POA: Diagnosis not present

## 2019-04-23 DIAGNOSIS — J449 Chronic obstructive pulmonary disease, unspecified: Secondary | ICD-10-CM

## 2019-04-23 DIAGNOSIS — I5032 Chronic diastolic (congestive) heart failure: Secondary | ICD-10-CM

## 2019-04-23 MED ORDER — METOLAZONE 5 MG PO TABS: 5.0000 mg | ORAL_TABLET | Freq: Every day | ORAL | 0 refills | Status: DC

## 2019-04-23 NOTE — Progress Notes (Signed)
Theresa [name]  MRN: [id number] DOB: [birth year]  Subjective:  HPI   The patient is a 63 year old female who present via electronic visit.  She was seen recently by her cardiologist.  She was having difficulty swelling and shortness of breath.  She was sent to the hospital for diuresis and instructed to follow up with cardiology and her primary care provider.   She reports that she still has some swelling and a little shortness of breath.  She states it has been about 10 days since the diuresis.   She reports that today she woke with nausea, but no vomiting or abdominal pain.  She does not think it is dietary related and has no other associated symptoms.    Virtual Visit via Video Note  I connected with Theresa [name] on 04/23/19 at  9:00 AM EDT by a video enabled telemedicine application and verified that I am speaking with the correct person using two identifiers.  Location: Patient: Home Provider: Office   I discussed the limitations of evaluation and management by telemedicine and the availability of in person appointments. The patient expressed understanding and agreed to proceed.  Patient Active Problem List   Diagnosis Date Noted  . Cervicalgia (Bilateral) (R>L) 09/24/2018  . Spondylosis without myelopathy or radiculopathy, lumbosacral region 09/24/2018  . Epigastric hernia 05/04/2018  . Chronic lower extremity pain (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) 01/25/2018  . Altered mental status, unspecified 12/09/2017  . Chronic hip pain [facility]) (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) 11/27/2017  . COPD (chronic obstructive pulmonary disease) ([place]) 08/11/2017  . Other long term (current) drug therapy 08/08/2017  . Disorder of bone, unspecified 08/08/2017  . COPD with hypoxia ([place]) 06/27/2017  . Arthralgia of acromioclavicular joint (Right) 06/15/2017  . Acromioclavicular joint DJD (Right) 06/15/2017  . 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  . Chronic shoulder pain (Left) 05/11/2017  . Elevated  brain natriuretic peptide (BNP) level 05/11/2017  . Grief at loss of child 02/14/2017  . Lumbar facet hypertrophy (multilevel) 11/17/2016  . Lumbar spinal stenosis (L4-5) 11/17/2016  . 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) 11/17/2016  . 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 Lumbosacral radiculopathy at L4 11/17/2016  . Supplemental oxygen dependent 08/01/2016  . Sleep apnea 06/02/2016  . Long term current use of opiate analgesic 01/12/2016  . Long term prescription opiate use 01/12/2016  . Opiate use (15 MME/Day) 01/12/2016  . Lumbar facet syndrome (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) 01/12/2016  . Chronic sacroiliac joint pain (Bilateral) 01/12/2016  . Vitamin D deficiency 12/30/2015  . 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 Obesity, Class III, BMI 40-49.9 (morbid obesity) ([place]) 12/28/2015  . 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[place]) 11/19/2015  . Coagulation disorder ([place]) 10/22/2015  . Chronic pain syndrome (significant psychosocial component) 09/21/2015  . 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 09/21/2015  . Encounter for therapeutic drug level monitoring 09/16/2015  . Encounter for chronic pain management 09/16/2015  . Pain management 09/16/2015  . Neurogenic pain 09/16/2015  . Neuropathic pain 09/16/2015  . Musculoskeletal pain 09/16/2015  . Lumbar spondylosis (L4-5) 09/16/2015  . Chronic low back pain (Primary Area of Pain) (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) 09/16/2015  . Chronic lower extremity pain (Secondary Area of Pain) (Left) 09/16/2015  . Chronic lumbar radicular pain (L4 & S1 Dermatome) (Left) 09/16/2015  . Chronic shoulder pain (Right side) 09/16/2015  . Chronic upper extremity pain (Right-sided) 09/16/2015  . Cervical radiculitis (Right side) 09/16/2015  . Abnormal x-ray of lumbar spine 09/16/2015  .  Chronic diastolic heart failure ([place]) 07/15/2015  . Chest pain 07/15/2015  . Tobacco abuse 07/15/2015  . Blood  clotting disorder ([place]) 04/21/2015  . Decreased motor strength 07/16/2014  . Clinical depression 07/16/2014  . Chronic rhinitis 07/16/2014  . Carpal tunnel syndrome 07/16/2014  . Anxiety state 07/16/2014  . Neuritis or radiculitis due to rupture of lumbar intervertebral disc 07/02/2014  . DDD (degenerative disc disease), lumbar 07/02/2014  . CAFL (chronic airflow limitation) ([place]) 02/27/2014  . Essential (primary) hypertension 03/27/2008   Past Medical History:  Diagnosis Date  . Acute postoperative pain 10/03/2017  . Anxiety   . BiPAP (biphasic positive airway pressure) dependence    at hs  . Carpal tunnel syndrome   . CHF (congestive heart failure) ([place])    1/18  . CHF (congestive heart failure) ([place])   . Chronic generalized abdominal pain   . Chronic rhinitis   . COPD (chronic obstructive pulmonary disease) ([place])   . DDD (degenerative disc disease), cervical   . DDD (degenerative disc disease), lumbosacral   . Depression   . Dyspnea   . Edema   . Flu    1/18  . Gastritis   . GERD (gastroesophageal reflux disease)   . Hematuria   . Hernia of abdominal wall 07/17/2018  . Hernia, abdominal   . Hypertension   . Kidney stones   . Low back pain   . Lumbar radiculitis   . Malodorous urine   . Muscle weakness   . Obesity   . Oxygen dependent   . Renal cyst   . Sensory urge incontinence   . Thyroid activity decreased    9/19  . Tobacco abuse   . Wheezing    Social History   Socioeconomic History  . Marital status: Married    Spouse name: Ovid Curd  . Number of children: 2  . Years of education: Not on file  . Highest education level: 12th grade  Occupational History  . Occupation: disability  Social Needs  . Financial resource strain: Somewhat hard  . Food insecurity    Worry: Never true    Inability: Never true  . Transportation needs    Medical: No    Non-medical: No  Tobacco Use  . Smoking status: Current Every Day Smoker    Packs/day: 1.00    Years:  44.00    Pack years: 44.00    Types: Cigarettes  . Smokeless tobacco: Never Used  Substance and Sexual Activity  . Alcohol use: No    Frequency: Never  . Drug use: No  . Sexual activity: Not Currently    Birth control/protection: Surgical  Lifestyle  . Physical activity    Days per week: 0 days    Minutes per session: 0 min  . Stress: Rather much  Relationships  . Social Herbalist on phone: Patient refused    Gets together: Patient refused    Attends religious service: Patient refused    Active member of club or organization: Patient refused    Attends meetings of clubs or organizations: Patient refused    Relationship status: Patient refused  . Intimate partner violence    Fear of current or ex partner: Patient refused    Emotionally abused: Patient refused    Physically abused: Patient refused    Forced sexual activity: Patient refused  Other Topics Concern  . Not on file  Social History Narrative   ** Merged History Encounter **  Lives with spouse    Outpatient Encounter Medications as of 04/23/2019  Medication Sig  . ALPRAZolam (XANAX) 0.5 MG tablet Take 1 tablet (0.5 mg total) by mouth 2 (two) times daily as needed for anxiety.  . bisoprolol-hydrochlorothiazide (ZIAC) 10-6.25 MG tablet Take 1 tablet by mouth daily.  . Cholecalciferol ([employer]S D3) 2000 units CAPS Take 2,000 Units by mouth daily.   . fluticasone (FLONASE SENSIMIST) 27.5 MCG/SPRAY nasal spray Place 2 sprays into the nose daily.  . Fluticasone-Umeclidin-Vilant 100-62.5-25 MCG/INH AEPB Inhale 1 puff into the lungs daily.  Marland Kitchen gabapentin (NEURONTIN) 300 MG capsule Take 1 capsule (300 mg total) by mouth 4 (four) times daily.  Marland Kitchen HYDROcodone-acetaminophen (NORCO/VICODIN) 5-325 MG tablet Take 1 tablet by mouth every 8 (eight) hours as needed for up to 30 days for severe pain.  Marland Kitchen ibuprofen (ADVIL,MOTRIN) 200 MG tablet Take 1,200 mg by mouth every 6 (six) hours as needed for headache or moderate pain.  Marland Kitchen  ipratropium-albuterol (DUONEB) 0.5-2.5 (3) MG/3ML SOLN USE 1 VIAL VIA NEBULIZER EVERY 6 HOURS AS NEEDED FOR SHORTNESS OF BREATH OR WHEEZING  . levothyroxine (SYNTHROID, LEVOTHROID) 25 MCG tablet Take 25 mcg by mouth daily before breakfast. DUPLICATE ENTRY  . OXYGEN Inhale 2.5 L into the lungs continuous. Reported on 11/03/2015  . potassium chloride SA (K-DUR,KLOR-CON) 20 MEQ tablet Take 1 tablet (20 mEq total) by mouth 2 (two) times daily.  . sertraline (ZOLOFT) 100 MG tablet TAKE 2 TABLETS(200 MG) BY MOUTH EVERY MORNING  . VENTOLIN HFA 108 (90 Base) MCG/ACT inhaler Inhale 2 puffs into the lungs every 6 (six) hours as needed for shortness of breath.  Marland Kitchen aspirin 81 MG tablet Take 81 mg by mouth daily.  . metolazone (ZAROXOLYN) 5 MG tablet Take 1 tablet (5 mg total) by mouth daily. Take 1/2 hour prior to torsemide (Patient not taking: Reported on 04/23/2019)  . torsemide (DEMADEX) 20 MG tablet Take 2 tablets (40 mg total) by mouth 2 (two) times daily for 30 days.  . [DISCONTINUED] doxycycline (VIBRA-TABS) 100 MG tablet Take 1 tablet (100 mg total) by mouth 2 (two) times daily.   No facility-administered encounter medications on file as of 04/23/2019.    Allergies  Allergen Reactions  . Codeine Nausea And Vomiting  . Meloxicam Other (See Comments)    Stomach pain   Review of Systems  Constitutional: Positive for malaise/fatigue. Negative for fever.  HENT: Negative for congestion, ear pain and sore throat.        Runny nose  Respiratory: Positive for cough, sputum production, shortness of breath and wheezing. Negative for hemoptysis.   Cardiovascular: Negative for chest pain, palpitations, orthopnea, claudication and leg swelling.       Upper body swelling, mostly hands  Gastrointestinal: Positive for nausea. Negative for abdominal pain and vomiting.    Objective:  There were no vitals taken for this visit. Wt Readings from Last 3 Encounters:  04/17/19 253 lb 8 oz (115 kg)  03/25/19 247 lb (112  kg)  03/18/19 248 lb (112.5 kg)    Assessment and Plan :   1. Swelling Has noticed swelling of hands, face and abdomen the past couple months. Also, feels her legs are "tight" but not much visible edema. Still taking the Torsemide 40 mg BID. Will give a three day regimen of Zaroxolyn added as she has done in the past. Breathing essentially unchanged and continues 2.5 LPM continuous oxygen by nasal cannula.  - metolazone (ZAROXOLYN) 5 MG tablet; Take 1 tablet (5 mg  total) by mouth daily.  Dispense: 3 tablet; Refill: 0  2. Chronic diastolic heart failure (HCC) Had BNP and BMP checked on 04-17-19 (normal renal function and electrolytes - BNP still above 300). Has a follow up appointment with Dr. Clayborn Bigness (cardiologist) tomorrow. Will give an extra dose of diuretic of Zaroxolyn with Torsemide for recent swelling. - metolazone (ZAROXOLYN) 5 MG tablet; Take 1 tablet (5 mg total) by mouth daily.  Dispense: 3 tablet; Refill: 0  3. COPD with hypoxia ([place]) Some shortness of breath but not recognized during telephonic interview. Still on the oxygen 24 hours a day at 2.5 LPM and Duoneb by nebulizer, Trelegy with Ventolin prn. Has an appointment to follow up with pulmonologist 07-09-19 (Dr. Raul Del). States her sister and her husband were confirmed COVID positive on 04-19-19 (she has not had any contact with them in 3 weeks). Denies any fever, GI upset or loss of taste/smell. Some slight headache and shortness of breath she associates with COPD and CHF. Advised to maintain COVID restrictions and notify us if any fever or new symptoms appear.   I discussed the assessment and treatment plan with the patient. The patient was provided an opportunity to ask questions and all were answered. The patient agreed with the plan and demonstrated an understanding of the instructions.   The patient was advised to call back or seek an in-person evaluation if the symptoms worsen or if the condition fails to improve as  anticipated.  I provided 15 minutes of non-face-to-face time during this encounter.

## 2019-04-24 DIAGNOSIS — I2721 Secondary pulmonary arterial hypertension: Secondary | ICD-10-CM | POA: Diagnosis not present

## 2019-04-24 DIAGNOSIS — J439 Emphysema, unspecified: Secondary | ICD-10-CM | POA: Diagnosis not present

## 2019-04-24 DIAGNOSIS — I10 Essential (primary) hypertension: Secondary | ICD-10-CM | POA: Diagnosis not present

## 2019-04-24 DIAGNOSIS — F411 Generalized anxiety disorder: Secondary | ICD-10-CM | POA: Diagnosis not present

## 2019-04-24 DIAGNOSIS — R609 Edema, unspecified: Secondary | ICD-10-CM | POA: Diagnosis not present

## 2019-04-24 DIAGNOSIS — M6281 Muscle weakness (generalized): Secondary | ICD-10-CM | POA: Diagnosis not present

## 2019-04-24 DIAGNOSIS — F329 Major depressive disorder, single episode, unspecified: Secondary | ICD-10-CM | POA: Diagnosis not present

## 2019-04-24 DIAGNOSIS — J449 Chronic obstructive pulmonary disease, unspecified: Secondary | ICD-10-CM | POA: Diagnosis not present

## 2019-04-24 DIAGNOSIS — R600 Localized edema: Secondary | ICD-10-CM | POA: Diagnosis not present

## 2019-05-01 DIAGNOSIS — J9610 Chronic respiratory failure, unspecified whether with hypoxia or hypercapnia: Secondary | ICD-10-CM | POA: Diagnosis not present

## 2019-05-04 DIAGNOSIS — J9610 Chronic respiratory failure, unspecified whether with hypoxia or hypercapnia: Secondary | ICD-10-CM | POA: Diagnosis not present

## 2019-05-05 NOTE — Progress Notes (Signed)
Pain Management Virtual Encounter Note - Virtual Visit via Telephone Telehealth (real-time audio visits between healthcare provider and patient).   Patient's Phone No. & Preferred Pharmacy:  [phone] (home); [phone] (mobile); (Preferred) [phone] [id number] [address], [address] [place] [address] Phone: [phone] Fax: [phone]    Pre-screening note:  Our staff contacted Theresa [name] and offered her an "in person", "face-to-face" appointment versus a telephone encounter. She indicated preferring the telephone encounter, at this time.   Reason for Virtual Visit: COVID-19*  Social distancing based on CDC and AMA recommendations.   I contacted Theresa [name] on 05/06/2019 via telephone.      I clearly identified myself as Gaspar Cola, MD. I verified that I was speaking with the correct person using two identifiers (Name: Theresa [name], and date of birth: 08-12-1956).  Advanced Informed Consent I sought verbal advanced consent from Theresa [name] for virtual visit interactions. I informed Theresa [name] of possible security and privacy concerns, risks, and limitations associated with providing "not-in-person" medical evaluation and management services. I also informed Theresa [name] of the availability of "in-person" appointments. Finally, I informed her that there would be a charge for the virtual visit and that she could be  personally, fully or partially, financially responsible for it. Theresa [name] expressed understanding and agreed to proceed.   Historic Elements   Theresa [name] is a 63 y.o. year old, female patient evaluated today after her last encounter by our practice on 02/04/2019. Ms. Six  has a past medical history of Acute postoperative pain (10/03/2017), Anxiety, BiPAP (biphasic positive airway pressure) dependence, Carpal tunnel syndrome, CHF (congestive  heart failure) ([place]), CHF (congestive heart failure) ([place]), Chronic generalized abdominal pain, Chronic rhinitis, COPD (chronic obstructive pulmonary disease) ([place]), DDD (degenerative disc disease), cervical, DDD (degenerative disc disease), lumbosacral, Depression, Dyspnea, Edema, Flu, Gastritis, GERD (gastroesophageal reflux disease), Hematuria, Hernia of abdominal wall (07/17/2018), Hernia, abdominal, Hypertension, Kidney stones, Low back pain, Lumbar radiculitis, Malodorous urine, Muscle weakness, Obesity, Oxygen dependent, Renal cyst, Sensory urge incontinence, Thyroid activity decreased, Tobacco abuse, and Wheezing. She also  has a past surgical history that includes Abdominal hysterectomy; Tonsillectomy; Carpal tunnel release (Left, 2012); Tubal ligation; epigastric hernia repair (N/A, 36/46/8032); and Umbilical hernia repair (N/A, 08/13/2018). Ms. Chavous has a current medication list which includes the following prescription(s): alprazolam, aspirin, bisoprolol-hydrochlorothiazide, cholecalciferol, fluticasone, fluticasone-umeclidin-vilant, gabapentin, hydrocodone-acetaminophen, hydrocodone-acetaminophen, hydrocodone-acetaminophen, ibuprofen, ipratropium-albuterol, levothyroxine, metolazone, oxygen-helium, potassium chloride sa, sertraline, torsemide, and ventolin hfa. She  reports that she has been smoking cigarettes. She has a 44.00 pack-year smoking history. She has never used smokeless tobacco. She reports that she does not drink alcohol or use drugs. Theresa [name] is allergic to codeine and meloxicam.   HPI  Today, she is being contacted for medication management.  Pharmacotherapy Assessment  Analgesic: Hydrocodone/APAP 5/325 one tablet every 8 hours (15 mg/day of hydrocodone) MME/day:15 mg/day.   Monitoring: Pharmacotherapy: No side-effects or adverse reactions reported. [place] PMP: PDMP reviewed during this encounter.       Compliance: No problems identified. Effectiveness: Clinically  acceptable. Plan: Refer to "POC".  Pertinent Labs   SAFETY SCREENING Profile Lab Results  Component Value Date   MRSAPCR NEGATIVE 12/10/2017   HIV Non Reactive 12/10/2017   PREGTESTUR NEGATIVE 10/17/2017   Renal Function Lab Results  Component Value Date   BUN 25 (H) 04/17/2019   CREATININE 0.97 04/17/2019   BCR  24 11/16/2018   GFRAA >60 04/17/2019   GFRNONAA >60 04/17/2019   Hepatic Function Lab Results  Component Value Date   AST 20 11/16/2018   ALT 20 11/16/2018   ALBUMIN 4.3 11/16/2018   UDS Summary  Date Value Ref Range Status  07/31/2018 FINAL  Final    Comment:    ==================================================================== TOXASSURE SELECT 13 (MW) ==================================================================== Test                             Result       Flag       Units Drug Present and Declared for Prescription Verification   Hydrocodone                    688          EXPECTED   ng/mg creat   Norhydrocodone                 552          EXPECTED   ng/mg creat    Sources of hydrocodone include scheduled prescription    medications. Norhydrocodone is an expected metabolite of    hydrocodone. Drug Absent but Declared for Prescription Verification   Alprazolam                     Not Detected UNEXPECTED ng/mg creat ==================================================================== Test                      Result    Flag   Units      Ref Range   Creatinine              25               mg/dL      >=20 ==================================================================== Declared Medications:  The flagging and interpretation on this report are based on the  following declared medications.  Unexpected results may arise from  inaccuracies in the declared medications.  **Note: The testing scope of this panel includes these medications:  Alprazolam  Hydrocodone (Hydrocodone-Acetaminophen)  **Note: The testing scope of this panel does not include  following  reported medications:  Acetaminophen (Hydrocodone-Acetaminophen)  Albuterol  Albuterol (Ipratropium-Albuterol)  Aspirin (Aspirin 81)  Bisoprolol (Ziac)  Fluticasone (Advair)  Gabapentin  Hydrochlorothiazide (Ziac)  Ipratropium (Ipratropium-Albuterol)  Levothyroxine  Oxygen  Potassium (K-Dur)  Ranitidine  Roflumilast  Salmeterol (Advair)  Sertraline  Torsemide ==================================================================== For clinical consultation, please call [phone]. ====================================================================    Note: Above Lab results reviewed.  Recent imaging  CT CHEST LUNG CANCER SCREENING LOW DOSE WO CONTRAST CLINICAL DATA:  63 year old female current smoker with 35 pack-year history of smoking. Lung cancer screening examination.  EXAM: CT CHEST WITHOUT CONTRAST LOW-DOSE FOR LUNG CANCER SCREENING  TECHNIQUE: Multidetector CT imaging of the chest was performed following the standard protocol without IV contrast.  COMPARISON:  Chest CT 02/01/2018.  FINDINGS: Cardiovascular: Heart size is normal. There is no significant pericardial fluid, thickening or pericardial calcification. There is aortic atherosclerosis, as well as atherosclerosis of the great vessels of the mediastinum and the coronary arteries, including calcified atherosclerotic plaque in the left main, left anterior descending, left circumflex and right coronary arteries.  Mediastinum/Nodes: No pathologically enlarged mediastinal or hilar lymph nodes. Please note that accurate exclusion of hilar adenopathy is limited on noncontrast CT scans. Esophagus is unremarkable in appearance. No axillary lymphadenopathy.  Lungs/Pleura: Multiple small pulmonary nodules  are noted throughout the lungs bilaterally, largest of which is in the periphery of the right upper lobe (axial image 71 of series 3), with a volume derived mean diameter of 3.9 mm. No larger  more suspicious appearing pulmonary nodules or masses are noted. No acute consolidative airspace disease. No pleural effusions. Mild diffuse bronchial wall thickening with mild centrilobular and paraseptal emphysema.  Upper Abdomen: Aortic atherosclerosis.  Musculoskeletal: There are no aggressive appearing lytic or blastic lesions noted in the visualized portions of the skeleton.  IMPRESSION: 1. Lung-RADS 2S, benign appearance or behavior. Continue annual screening with low-dose chest CT without contrast in 12 months. 2. The "S" modifier above refers to potentially clinically significant non lung cancer related findings. Specifically, there is aortic atherosclerosis, in addition to left main and 3 vessel coronary artery disease. Please note that although the presence of coronary artery calcium documents the presence of coronary artery disease, the severity of this disease and any potential stenosis cannot be assessed on this non-gated CT examination. Assessment for potential risk factor modification, dietary therapy or pharmacologic therapy may be warranted, if clinically indicated. 3. Mild diffuse bronchial wall thickening with mild centrilobular and paraseptal emphysema; imaging findings suggestive of underlying COPD.  Aortic Atherosclerosis (ICD10-I70.0) and Emphysema (ICD10-J43.9).  Electronically Signed   By: Vinnie Langton M.D.   On: 03/25/2019 15:19  Assessment  The primary encounter diagnosis was Chronic pain syndrome (significant psychosocial component). Diagnoses of Chronic low back pain (Primary Area of Pain) (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), Chronic lower extremity pain (Secondary Area of Pain) (Left), Chronic hip pain (Tertiary Area of Pain) (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), Neurogenic pain, and Neuropathic pain were also pertinent to this visit.  Plan of Care  I have discontinued [facility] HYDROcodone-acetaminophen and HYDROcodone-acetaminophen. I have also changed her  HYDROcodone-acetaminophen. Additionally, I am having her start on HYDROcodone-acetaminophen and HYDROcodone-acetaminophen. Lastly, I am having her maintain her OXYGEN, Cholecalciferol, ALPRAZolam, Ventolin HFA, aspirin, bisoprolol-hydrochlorothiazide, levothyroxine, ibuprofen, potassium chloride SA, Fluticasone-Umeclidin-Vilant, sertraline, ipratropium-albuterol, fluticasone, torsemide, metolazone, and gabapentin.  Pharmacotherapy (Medications Ordered): Meds ordered this encounter  Medications  . HYDROcodone-acetaminophen (NORCO/VICODIN) 5-325 MG tablet    Sig: Take 1 tablet by mouth every 8 (eight) hours as needed for severe pain. Must last 30 days    Dispense:  90 tablet    Refill:  0    Chronic Pain: STOP Act (Not applicable) Fill 1 day early if closed on refill date. Do not fill until: 05/18/2019. To last until: 06/17/2019. Avoid benzodiazepines within 8 hours of opioids  . HYDROcodone-acetaminophen (NORCO/VICODIN) 5-325 MG tablet    Sig: Take 1 tablet by mouth every 8 (eight) hours as needed for severe pain. Must last 30 days    Dispense:  90 tablet    Refill:  0    Chronic Pain: STOP Act (Not applicable) Fill 1 day early if closed on refill date. Do not fill until: 06/17/2019. To last until: 07/17/2019. Avoid benzodiazepines within 8 hours of opioids  . HYDROcodone-acetaminophen (NORCO/VICODIN) 5-325 MG tablet    Sig: Take 1 tablet by mouth every 8 (eight) hours as needed for severe pain. Must last 30 days    Dispense:  90 tablet    Refill:  0    Chronic Pain: STOP Act (Not applicable) Fill 1 day early if closed on refill date. Do not fill until: 07/17/2019. To last until: 08/16/2019. Avoid benzodiazepines within 8 hours of opioids  . gabapentin (NEURONTIN) 300 MG capsule    Sig: Take 1 capsule (300 mg total) by mouth  4 (four) times daily.    Dispense:  120 capsule    Refill:  2    Fill one day early if pharmacy is closed on scheduled refill date. May substitute for generic if available.    Orders:  No orders of the defined types were placed in this encounter.  Follow-up plan:   Return in about 3 months (around 08/14/2019) for (VV), E/M (MM).      Interventional management options: Planned, scheduled, and/or pending: NOTE: NO Lumbar RFA until BMI <35. No further procedures planned at this time.   Considering: Leftlumbar facet RFA #1(done on 06/27/2017) Right lumbar facet RFA #1 (done on 10/03/2017)  TherapeuticleftL4-5 interlaminar LESI + leftL4 TFESI   Palliative PRN treatment(s): Palliative bilateral lumbar facetblocks Palliative left L4-5 interlaminar LESI + left L4TFESI PalliativerightL4-5 interlaminar LESI+ rightL4 transforaminal LESI     Recent Visits No visits were found meeting these conditions.  Showing recent visits within past 90 days and meeting all other requirements   Today's Visits Date Type Provider Dept  05/06/19 Office Visit Milinda Pointer, MD [facility]  Showing today's visits and meeting all other requirements   Future Appointments No visits were found meeting these conditions.  Showing future appointments within next 90 days and meeting all other requirements   I discussed the assessment and treatment plan with the patient. The patient was provided an opportunity to ask questions and all were answered. The patient agreed with the plan and demonstrated an understanding of the instructions.  Patient advised to call back or seek an in-person evaluation if the symptoms or condition worsens.  Total duration of non-face-to-face encounter: 12 minutes.  Note by: Gaspar Cola, MD Date: 05/06/2019; Time: 10:46 AM  Note: This dictation was prepared with Dragon dictation. Any transcriptional errors that may result from this process are unintentional.  Disclaimer:  * Given the special circumstances of the COVID-19 pandemic, the federal government has announced that the Office for Civil Rights (OCR) will  exercise its enforcement discretion and will not impose penalties on physicians using telehealth in the event of noncompliance with regulatory requirements under the [place] (HIPAA) in connection with the good faith provision of telehealth during the PTCKF-25 national public health emergency. ([place])

## 2019-05-06 ENCOUNTER — Ambulatory Visit: Payer: Medicare HMO | Attending: Nurse Practitioner | Admitting: Pain Medicine

## 2019-05-06 ENCOUNTER — Other Ambulatory Visit: Payer: Self-pay

## 2019-05-06 DIAGNOSIS — M792 Neuralgia and neuritis, unspecified: Secondary | ICD-10-CM | POA: Diagnosis not present

## 2019-05-06 DIAGNOSIS — M79605 Pain in left leg: Secondary | ICD-10-CM

## 2019-05-06 DIAGNOSIS — M545 Low back pain: Secondary | ICD-10-CM

## 2019-05-06 DIAGNOSIS — G8929 Other chronic pain: Secondary | ICD-10-CM | POA: Diagnosis not present

## 2019-05-06 DIAGNOSIS — G894 Chronic pain syndrome: Secondary | ICD-10-CM | POA: Diagnosis not present

## 2019-05-06 DIAGNOSIS — M25552 Pain in left hip: Secondary | ICD-10-CM

## 2019-05-06 DIAGNOSIS — M25551 Pain in right hip: Secondary | ICD-10-CM

## 2019-05-06 MED ORDER — HYDROCODONE-ACETAMINOPHEN 5-325 MG PO TABS: 1.0000 | ORAL_TABLET | Freq: Three times a day (TID) | ORAL | 0 refills | Status: DC | PRN

## 2019-05-06 MED ORDER — GABAPENTIN 300 MG PO CAPS: 300.0000 mg | ORAL_CAPSULE | Freq: Four times a day (QID) | ORAL | 2 refills | Status: DC

## 2019-05-06 NOTE — Patient Instructions (Signed)
____________________________________________________________________________________________  Medication Recommendations and Reminders  Applies to: All patients receiving prescriptions (written and/or electronic).  Medication Rules & Regulations: These rules and regulations exist for your safety and that of others. They are not flexible and neither are we. Dismissing or ignoring them will be considered "non-compliance" with medication therapy, resulting in complete and irreversible termination of such therapy. (See document titled "Medication Rules" for more details.) In all conscience, because of safety reasons, we cannot continue providing a therapy where the patient does not follow instructions.  Pharmacy of record:   Definition: This is the pharmacy where your electronic prescriptions will be sent.   We do not endorse any particular pharmacy.  You are not restricted in your choice of pharmacy.  The pharmacy listed in the electronic medical record should be the one where you want electronic prescriptions to be sent.  If you choose to change pharmacy, simply notify our nursing staff of your choice of new pharmacy.  Recommendations:  Keep all of your pain medications in a safe place, under lock and key, even if you live alone.   After you fill your prescription, take 1 week's worth of pills and put them away in a safe place. You should keep a separate, properly labeled bottle for this purpose. The remainder should be kept in the original bottle. Use this as your primary supply, until it runs out. Once it's gone, then you know that you have 1 week's worth of medicine, and it is time to come in for a prescription refill. If you do this correctly, it is unlikely that you will ever run out of medicine.  To make sure that the above recommendation works, it is very important that you make sure your medication refill appointments are scheduled at least 1 week before you run out of medicine. To do  this in an effective manner, make sure that you do not leave the office without scheduling your next medication management appointment. Always ask the nursing staff to show you in your prescription , when your medication will be running out. Then arrange for the receptionist to get you a return appointment, at least 7 days before you run out of medicine. Do not wait until you have 1 or 2 pills left, to come in. This is very poor planning and does not take into consideration that we may need to cancel appointments due to bad weather, sickness, or emergencies affecting our staff.  "Partial Fill": If for any reason your pharmacy does not have enough pills/tablets to completely fill or refill your prescription, do not allow for a "partial fill". You will need a separate prescription to fill the remaining amount, which we will not provide. If the reason for the partial fill is your insurance, you will need to talk to the pharmacist about payment alternatives for the remaining tablets, but again, do not accept a partial fill.  Prescription refills and/or changes in medication(s):   Prescription refills, and/or changes in dose or medication, will be conducted only during scheduled medication management appointments. (Applies to both, written and electronic prescriptions.)  No refills on procedure days. No medication will be changed or started on procedure days. No changes, adjustments, and/or refills will be conducted on a procedure day. Doing so will interfere with the diagnostic portion of the procedure.  No phone refills. No medications will be "called into the pharmacy".  No Fax refills.  No weekend refills.  No Holliday refills.  No after hours refills.  Remember:  Business  hours are:  Monday to Thursday 8:00 AM to 4:00 PM Provider's Schedule: [name], NP - Appointments are:  Medication management: Monday to Thursday 8:00 AM to 4:00 PM Milinda Pointer, MD - Appointments are:   Medication management: Monday and Wednesday 8:00 AM to 4:00 PM Procedure day: Tuesday and Thursday 7:30 AM to 4:00 PM Gillis Santa, MD - Appointments are:  Medication management: Tuesday and Thursday 8:00 AM to 4:00 PM Procedure day: Monday and Wednesday 7:30 AM to 4:00 PM (Last update: 12/14/2017) ____________________________________________________________________________________________

## 2019-05-13 NOTE — Progress Notes (Deleted)
Patient ID: Theresa [name], female    DOB: [birth year], 63 y.o.   MRN: [id number]  HPI [name] is a 63 y/o female with a history of chronic low back pain, HTN, depression, COPD on long-term oxygen, anxiety, carpal tunnel syndrome, long-standing tobacco use and chronic heart failure.  Reviewed echo report on 12/11/17 which showed an EF of 55-60% along with trivial AR and an elevated PA pressure of 58 mm Hg. Echo done 11/03/15 but unable to access results. Echo done on 02/19/15 showed an EF of 50% without valvular stenosis. Recent PFT's done on 01/18/16  Has not been admitted or been in the ED in the last 6 months.   She presents today for a follow-up visit with a chief complaint of   Past Medical History:  Diagnosis Date  . Acute postoperative pain 10/03/2017  . Anxiety   . BiPAP (biphasic positive airway pressure) dependence    at hs  . Carpal tunnel syndrome   . CHF (congestive heart failure) ([place])    1/18  . CHF (congestive heart failure) ([place])   . Chronic generalized abdominal pain   . Chronic rhinitis   . COPD (chronic obstructive pulmonary disease) ([place])   . DDD (degenerative disc disease), cervical   . DDD (degenerative disc disease), lumbosacral   . Depression   . Dyspnea   . Edema   . Flu    1/18  . Gastritis   . GERD (gastroesophageal reflux disease)   . Hematuria   . Hernia of abdominal wall 07/17/2018  . Hernia, abdominal   . Hypertension   . Kidney stones   . Low back pain   . Lumbar radiculitis   . Malodorous urine   . Muscle weakness   . Obesity   . Oxygen dependent   . Renal cyst   . Sensory urge incontinence   . Thyroid activity decreased    9/19  . Tobacco abuse   . Wheezing    Past Surgical History:  Procedure Laterality Date  . ABDOMINAL HYSTERECTOMY    . CARPAL TUNNEL RELEASE Left 2012  . EPIGASTRIC HERNIA REPAIR N/A 08/13/2018   HERNIA REPAIR EPIGASTRIC ADULT; polypropylene mesh reinforcement.  Surgeon: Robert Bellow, MD;  Location: [facility]  ORS;  Service: General;  Laterality: N/A;  . TONSILLECTOMY    . TUBAL LIGATION    . UMBILICAL HERNIA REPAIR N/A 08/13/2018   HERNIA REPAIR UMBILICAL ADULT; polypropylene mesh reinforcement Surgeon: Robert Bellow, MD;  Location: [facility] ORS;  Service: General;  Laterality: N/A;   Family History  Problem Relation Age of Onset  . Heart disease Mother   . Stroke Mother   . Coronary artery disease Mother   . Lung cancer Sister   . Cancer Sister   . Breast cancer Sister   . Alcohol abuse Father   . Heart disease Father   . Cancer Brother   . Cancer Brother   . Pneumonia Brother   . Prostate cancer Neg Hx   . Kidney cancer Neg Hx   . Bladder Cancer Neg Hx    Social History   Tobacco Use  . Smoking status: Current Every Day Smoker    Packs/day: 1.00    Years: 44.00    Pack years: 44.00    Types: Cigarettes  . Smokeless tobacco: Never Used  Substance Use Topics  . Alcohol use: No    Frequency: Never   Allergies  Allergen Reactions  . Codeine Nausea And Vomiting  . Meloxicam Other (  See Comments)    Stomach pain     Review of Systems  Constitutional: Positive for fatigue. Negative for appetite change.  HENT: Negative for congestion, postnasal drip and sore throat.   Eyes: Negative.   Respiratory: Positive for cough, shortness of breath (moderate) and wheezing. Negative for chest tightness.   Cardiovascular: Positive for chest pain (at times). Negative for palpitations and leg swelling.  Gastrointestinal: Positive for abdominal distention. Negative for abdominal pain and nausea.  Endocrine: Negative.   Genitourinary: Negative.   Musculoskeletal: Positive for back pain (chronic). Negative for neck pain.  Skin: Negative.   Allergic/Immunologic: Negative.   Neurological: Negative for dizziness and light-headedness.  Hematological: Negative for adenopathy. Bruises/bleeds easily.  Psychiatric/Behavioral: Positive for dysphoric mood and sleep disturbance (wearing oxygen &  bipap at night). Negative for agitation and suicidal ideas. The patient is nervous/anxious.       Physical Exam  Constitutional: She is oriented to person, place, and time. She appears well-developed and well-nourished.  HENT:  Head: Normocephalic and atraumatic.  Neck: Normal range of motion. Neck supple. No JVD present.  Cardiovascular: Normal rate and regular rhythm.  Pulmonary/Chest: Effort normal. She has no wheezes. She has rhonchi in the right upper field and the left upper field. She has rales in the right lower field, the left upper field and the left lower field.  Abdominal: Soft. She exhibits distension. There is no abdominal tenderness.  Musculoskeletal:        General: No tenderness or edema.  Neurological: She is alert and oriented to person, place, and time.  Skin: Skin is warm and dry.  Psychiatric: Her behavior is normal. Thought content normal. Her mood appears anxious. She does not exhibit a depressed mood.  Nursing note and vitals reviewed.     Assessment & Plan:  1: Acute on Chronic heart failure with preserved ejection fraction- - NYHA class III - fluid overloaded today with weight gain, abdominal distention and rales - continues to weigh daily at home & says that her weight has gradually risen. Reminded to call for an overnight weight gain of >2 pounds or a weekly weight gain of >5 pounds - weight 253.8 pounds from last visit 10monthago  - not adding salt and is trying to eat low sodium foods - saw cardiologist (Clayborn Bigness 04/24/2019 - BNP 04/17/2019 was 301.0   2: HTN- - BP  - saw PCP (Chrismon) 03/18/2019 - BMP from 04/17/2019 reviewed and showed sodium 141, potassium 3.7, creatinine 0.97 and GFR >60  3: COPD- - wearing bipap nightly - oxygen at 2.5L at bedtime and then as needed during the day - last saw pulmonology (Raul Del on 02/05/2019 - using nebulizer  4: Tobacco use- - smoking 1 ppd cigarettes - removes herself from the oxygen when smoking -  complete cessation discussed for 3 minutes with her  Patient did not bring her medications nor a list. Each medication was verbally reviewed with the patient and she was encouraged to bring the bottles to every visit to confirm accuracy of list.

## 2019-05-14 ENCOUNTER — Ambulatory Visit: Payer: Medicare HMO | Admitting: Family

## 2019-05-22 DIAGNOSIS — H5212 Myopia, left eye: Secondary | ICD-10-CM | POA: Diagnosis not present

## 2019-05-22 DIAGNOSIS — H52221 Regular astigmatism, right eye: Secondary | ICD-10-CM | POA: Diagnosis not present

## 2019-05-22 DIAGNOSIS — H52222 Regular astigmatism, left eye: Secondary | ICD-10-CM | POA: Diagnosis not present

## 2019-05-22 DIAGNOSIS — H524 Presbyopia: Secondary | ICD-10-CM | POA: Diagnosis not present

## 2019-05-22 DIAGNOSIS — H5201 Hypermetropia, right eye: Secondary | ICD-10-CM | POA: Diagnosis not present

## 2019-05-22 DIAGNOSIS — H2513 Age-related nuclear cataract, bilateral: Secondary | ICD-10-CM | POA: Diagnosis not present

## 2019-05-22 NOTE — Progress Notes (Signed)
Patient ID: Theresa [name], female    DOB: 1956-03-02, 63 y.o.   MRN: [id number]  HPI [name] is a 63 y/o female with a history of chronic low back pain, HTN, depression, COPD on long-term oxygen, anxiety, carpal tunnel syndrome, long-standing tobacco use and chronic heart failure.  Reviewed echo report on 12/11/17 which showed an EF of 55-60% along with trivial AR and an elevated PA pressure of 58 mm Hg. Echo done 11/03/15 but unable to access results. Echo done on 02/19/15 showed an EF of 50% without valvular stenosis. Recent PFT's done on 01/18/16  Has not been admitted or been in the ED in the last 6 months.   She presents today for a follow-up visit with a chief complaint of moderate shortness of breath upon minimal exertion. She describes this as chronic in nature having been present for several years and it does tend to get worse when she's wearing the face mask or when she's out in the hot weather. She has associated fatigue, cough, occasional chest pain and chronic difficulty sleeping along with this. She denies any dizziness, abdominal distention, palpitations, pedal edema or weight gain. Has been trying to count calories when she's eating.   Past Medical History:  Diagnosis Date  . Acute postoperative pain 10/03/2017  . Anxiety   . BiPAP (biphasic positive airway pressure) dependence    at hs  . Carpal tunnel syndrome   . CHF (congestive heart failure) ([place])    1/18  . CHF (congestive heart failure) ([place])   . Chronic generalized abdominal pain   . Chronic rhinitis   . COPD (chronic obstructive pulmonary disease) ([place])   . DDD (degenerative disc disease), cervical   . DDD (degenerative disc disease), lumbosacral   . Depression   . Dyspnea   . Edema   . Flu    1/18  . Gastritis   . GERD (gastroesophageal reflux disease)   . Hematuria   . Hernia of abdominal wall 07/17/2018  . Hernia, abdominal   . Hypertension   . Kidney stones   . Low back pain   . Lumbar radiculitis    . Malodorous urine   . Muscle weakness   . Obesity   . Oxygen dependent   . Renal cyst   . Sensory urge incontinence   . Thyroid activity decreased    9/19  . Tobacco abuse   . Wheezing    Past Surgical History:  Procedure Laterality Date  . ABDOMINAL HYSTERECTOMY    . CARPAL TUNNEL RELEASE Left 2012  . EPIGASTRIC HERNIA REPAIR N/A 08/13/2018   HERNIA REPAIR EPIGASTRIC ADULT; polypropylene mesh reinforcement.  Surgeon: Robert Bellow, MD;  Location: [facility] ORS;  Service: General;  Laterality: N/A;  . TONSILLECTOMY    . TUBAL LIGATION    . UMBILICAL HERNIA REPAIR N/A 08/13/2018   HERNIA REPAIR UMBILICAL ADULT; polypropylene mesh reinforcement Surgeon: Robert Bellow, MD;  Location: [facility] ORS;  Service: General;  Laterality: N/A;   Family History  Problem Relation Age of Onset  . Heart disease Mother   . Stroke Mother   . Coronary artery disease Mother   . Lung cancer Sister   . Cancer Sister   . Breast cancer Sister   . Alcohol abuse Father   . Heart disease Father   . Cancer Brother   . Cancer Brother   . Pneumonia Brother   . Prostate cancer Neg Hx   . Kidney cancer Neg Hx   . Bladder  Cancer Neg Hx    Social History   Tobacco Use  . Smoking status: Current Every Day Smoker    Packs/day: 1.00    Years: 44.00    Pack years: 44.00    Types: Cigarettes  . Smokeless tobacco: Never Used  Substance Use Topics  . Alcohol use: No    Frequency: Never   Allergies  Allergen Reactions  . Codeine Nausea And Vomiting  . Meloxicam Other (See Comments)    Stomach pain   Prior to Admission medications   Medication Sig Start Date End Date Taking? Authorizing Provider  ALPRAZolam Duanne Moron) 0.5 MG tablet Take 1 tablet (0.5 mg total) by mouth 2 (two) times daily as needed for anxiety. 12/09/16  Yes Lavone Barrientes A, FNP  bisoprolol-hydrochlorothiazide (ZIAC) 10-6.25 MG tablet Take 1 tablet by mouth daily. 05/02/18  Yes Jerrol Banana., MD  Cholecalciferol ([facility] D3)  2000 units CAPS Take 2,000 Units by mouth daily.    Yes [provider]  Fluticasone-Umeclidin-Vilant 100-62.5-25 MCG/INH AEPB Inhale 1 puff into the lungs daily. 10/03/18  Yes [provider]  gabapentin (NEURONTIN) 300 MG capsule Take 1 capsule (300 mg total) by mouth 4 (four) times daily. 05/18/19 08/16/19 Yes Milinda Pointer, MD  HYDROcodone-acetaminophen (NORCO/VICODIN) 5-325 MG tablet Take 1 tablet by mouth every 8 (eight) hours as needed for severe pain. Must last 30 days 05/18/19 06/17/19 Yes Milinda Pointer, MD  HYDROcodone-acetaminophen (NORCO/VICODIN) 5-325 MG tablet Take 1 tablet by mouth every 8 (eight) hours as needed for severe pain. Must last 30 days 06/17/19 07/17/19 Yes Milinda Pointer, MD  HYDROcodone-acetaminophen (NORCO/VICODIN) 5-325 MG tablet Take 1 tablet by mouth every 8 (eight) hours as needed for severe pain. Must last 30 days 07/17/19 08/16/19 Yes Milinda Pointer, MD  ibuprofen (ADVIL,MOTRIN) 200 MG tablet Take 1,200 mg by mouth every 6 (six) hours as needed for headache or moderate pain.   Yes [provider]  ipratropium-albuterol (DUONEB) 0.5-2.5 (3) MG/3ML SOLN USE 1 VIAL VIA NEBULIZER EVERY 6 HOURS AS NEEDED FOR SHORTNESS OF BREATH OR WHEEZING 11/30/18  Yes Chrismon, Vickki Muff, PA  levothyroxine (SYNTHROID, LEVOTHROID) 25 MCG tablet Take 25 mcg by mouth daily before breakfast. DUPLICATE ENTRY   Yes [provider]  metolazone (ZAROXOLYN) 5 MG tablet Take 1 tablet (5 mg total) by mouth daily. 04/23/19 07/22/19 Yes Chrismon, Vickki Muff, PA  OXYGEN Inhale 2.5 L into the lungs continuous. Reported on 11/03/2015   Yes [provider]  potassium chloride SA (K-DUR,KLOR-CON) 20 MEQ tablet Take 1 tablet (20 mEq total) by mouth 2 (two) times daily. 08/31/18  Yes Darylene Price A, FNP  sertraline (ZOLOFT) 100 MG tablet TAKE 2 TABLETS(200 MG) BY MOUTH EVERY MORNING 11/16/18  Yes Chrismon, Vickki Muff, PA  torsemide (DEMADEX) 20 MG tablet Take 2  tablets (40 mg total) by mouth 2 (two) times daily for 30 days. 03/07/19 05/23/19 Yes Falyn Rubel, Aura Fey, FNP  VENTOLIN HFA 108 (90 Base) MCG/ACT inhaler Inhale 2 puffs into the lungs every 6 (six) hours as needed for shortness of breath. 02/10/18  Yes Fritzi Mandes, MD    Review of Systems  Constitutional: Positive for fatigue. Negative for appetite change.  HENT: Negative for congestion, postnasal drip and sore throat.   Eyes: Negative.   Respiratory: Positive for cough and shortness of breath (moderate). Negative for chest tightness and wheezing.   Cardiovascular: Positive for chest pain (at times). Negative for palpitations and leg swelling.  Gastrointestinal: Negative for abdominal distention and abdominal  pain.  Endocrine: Negative.   Genitourinary: Negative.   Musculoskeletal: Positive for back pain (chronic). Negative for neck pain.  Skin: Negative.   Allergic/Immunologic: Negative.   Neurological: Negative for dizziness and light-headedness.  Hematological: Negative for adenopathy. Bruises/bleeds easily.  Psychiatric/Behavioral: Positive for dysphoric mood and sleep disturbance (wearing oxygen & bipap at night). Negative for agitation and suicidal ideas. The patient is nervous/anxious.    Vitals:   05/23/19 1141  BP: 120/70  Pulse: 69  Resp: 18  SpO2: 92%  Weight: 242 lb 2 oz (109.8 kg)  Height: 5' 2" (1.575 m)   Wt Readings from Last 3 Encounters:  05/23/19 242 lb 2 oz (109.8 kg)  04/17/19 253 lb 8 oz (115 kg)  03/25/19 247 lb (112 kg)   Lab Results  Component Value Date   CREATININE 0.97 04/17/2019   CREATININE 0.82 11/16/2018   CREATININE 0.83 07/31/2018     Physical Exam  Constitutional: She is oriented to person, place, and time. She appears well-developed and well-nourished.  HENT:  Head: Normocephalic and atraumatic.  Neck: Normal range of motion. Neck supple. No JVD present.  Cardiovascular: Normal rate and regular rhythm.  Pulmonary/Chest: Effort normal. She  has no wheezes. She has rhonchi in the right lower field. She has no rales.  Abdominal: Soft. She exhibits no distension. There is no abdominal tenderness.  Musculoskeletal:        General: No tenderness or edema.  Neurological: She is alert and oriented to person, place, and time.  Skin: Skin is warm and dry.  Psychiatric: Her behavior is normal. Thought content normal. Her mood appears anxious. She does not exhibit a depressed mood.  Nursing note and vitals reviewed.     Assessment & Plan:  1: Chronic heart failure with preserved ejection fraction- - NYHA class III - euvolemic today - continues to weigh daily at home & says that her weight has gradually fallen as she's been counting calories. Reminded to call for an overnight weight gain of >2 pounds or a weekly weight gain of >5 pounds - weight down 11 pounds from last visit 1 month ago - not adding salt and is trying to eat low sodium foods - saw cardiologist Clayborn Bigness) 04/24/2019 - BNP 04/17/2019 was 301.0  2: HTN- - BP looks good today - saw PCP (Chrismon) 03/18/2019 - BMP from 04/17/2019 reviewed and showed sodium 141, potassium 3.7, creatinine 0.97 and GFR >60  3: COPD- - wearing bipap nightly - oxygen at 2.5L at bedtime and then as needed during the day - last saw pulmonology Raul Del) on 02/05/2019 - using nebulizer as needed  4: Tobacco use- - smoking 1 ppd cigarettes - removes herself from the oxygen when smoking - complete cessation discussed for 3 minutes with her  Patient did not bring her medications nor a list. Each medication was verbally reviewed with the patient and she was encouraged to bring the bottles to every visit to confirm accuracy of list.  Return in 3 months or sooner for any questions/problems before then.

## 2019-05-23 ENCOUNTER — Other Ambulatory Visit: Payer: Self-pay

## 2019-05-23 ENCOUNTER — Encounter: Payer: Self-pay | Admitting: Family

## 2019-05-23 ENCOUNTER — Ambulatory Visit: Payer: Medicare HMO | Attending: Family | Admitting: Family

## 2019-05-23 VITALS — BP 120/70 | HR 69 | Resp 18 | Ht 62.0 in | Wt 242.1 lb

## 2019-05-23 DIAGNOSIS — G8929 Other chronic pain: Secondary | ICD-10-CM | POA: Insufficient documentation

## 2019-05-23 DIAGNOSIS — Z888 Allergy status to other drugs, medicaments and biological substances status: Secondary | ICD-10-CM | POA: Insufficient documentation

## 2019-05-23 DIAGNOSIS — Z8249 Family history of ischemic heart disease and other diseases of the circulatory system: Secondary | ICD-10-CM | POA: Diagnosis not present

## 2019-05-23 DIAGNOSIS — I110 Hypertensive heart disease with heart failure: Secondary | ICD-10-CM | POA: Diagnosis not present

## 2019-05-23 DIAGNOSIS — M5137 Other intervertebral disc degeneration, lumbosacral region: Secondary | ICD-10-CM | POA: Insufficient documentation

## 2019-05-23 DIAGNOSIS — M5030 Other cervical disc degeneration, unspecified cervical region: Secondary | ICD-10-CM | POA: Diagnosis not present

## 2019-05-23 DIAGNOSIS — Z79890 Hormone replacement therapy (postmenopausal): Secondary | ICD-10-CM | POA: Insufficient documentation

## 2019-05-23 DIAGNOSIS — F419 Anxiety disorder, unspecified: Secondary | ICD-10-CM | POA: Diagnosis not present

## 2019-05-23 DIAGNOSIS — Z79899 Other long term (current) drug therapy: Secondary | ICD-10-CM | POA: Diagnosis not present

## 2019-05-23 DIAGNOSIS — Z803 Family history of malignant neoplasm of breast: Secondary | ICD-10-CM | POA: Diagnosis not present

## 2019-05-23 DIAGNOSIS — F17210 Nicotine dependence, cigarettes, uncomplicated: Secondary | ICD-10-CM | POA: Insufficient documentation

## 2019-05-23 DIAGNOSIS — Z720 Tobacco use: Secondary | ICD-10-CM

## 2019-05-23 DIAGNOSIS — I5032 Chronic diastolic (congestive) heart failure: Secondary | ICD-10-CM

## 2019-05-23 DIAGNOSIS — Z885 Allergy status to narcotic agent status: Secondary | ICD-10-CM | POA: Diagnosis not present

## 2019-05-23 DIAGNOSIS — I10 Essential (primary) hypertension: Secondary | ICD-10-CM

## 2019-05-23 DIAGNOSIS — J410 Simple chronic bronchitis: Secondary | ICD-10-CM

## 2019-05-23 DIAGNOSIS — J449 Chronic obstructive pulmonary disease, unspecified: Secondary | ICD-10-CM | POA: Insufficient documentation

## 2019-05-23 DIAGNOSIS — Z809 Family history of malignant neoplasm, unspecified: Secondary | ICD-10-CM | POA: Diagnosis not present

## 2019-05-23 DIAGNOSIS — F329 Major depressive disorder, single episode, unspecified: Secondary | ICD-10-CM | POA: Diagnosis not present

## 2019-05-23 DIAGNOSIS — J9610 Chronic respiratory failure, unspecified whether with hypoxia or hypercapnia: Secondary | ICD-10-CM | POA: Diagnosis not present

## 2019-05-23 DIAGNOSIS — Z801 Family history of malignant neoplasm of trachea, bronchus and lung: Secondary | ICD-10-CM | POA: Insufficient documentation

## 2019-05-23 DIAGNOSIS — E669 Obesity, unspecified: Secondary | ICD-10-CM | POA: Insufficient documentation

## 2019-05-23 DIAGNOSIS — E039 Hypothyroidism, unspecified: Secondary | ICD-10-CM | POA: Insufficient documentation

## 2019-05-23 NOTE — Patient Instructions (Signed)
Continue weighing daily and call for an overnight weight gain of > 2 pounds or a weekly weight gain of >5 pounds. 

## 2019-05-24 DIAGNOSIS — H2511 Age-related nuclear cataract, right eye: Secondary | ICD-10-CM | POA: Diagnosis not present

## 2019-05-24 DIAGNOSIS — H2513 Age-related nuclear cataract, bilateral: Secondary | ICD-10-CM | POA: Diagnosis not present

## 2019-05-24 DIAGNOSIS — H25043 Posterior subcapsular polar age-related cataract, bilateral: Secondary | ICD-10-CM | POA: Diagnosis not present

## 2019-05-24 DIAGNOSIS — H25013 Cortical age-related cataract, bilateral: Secondary | ICD-10-CM | POA: Diagnosis not present

## 2019-05-24 DIAGNOSIS — H18413 Arcus senilis, bilateral: Secondary | ICD-10-CM | POA: Diagnosis not present

## 2019-05-28 ENCOUNTER — Other Ambulatory Visit: Payer: Self-pay | Admitting: Family

## 2019-06-01 DIAGNOSIS — J9610 Chronic respiratory failure, unspecified whether with hypoxia or hypercapnia: Secondary | ICD-10-CM | POA: Diagnosis not present

## 2019-06-04 DIAGNOSIS — J9610 Chronic respiratory failure, unspecified whether with hypoxia or hypercapnia: Secondary | ICD-10-CM | POA: Diagnosis not present

## 2019-06-17 DIAGNOSIS — H2512 Age-related nuclear cataract, left eye: Secondary | ICD-10-CM | POA: Diagnosis not present

## 2019-06-17 DIAGNOSIS — H25012 Cortical age-related cataract, left eye: Secondary | ICD-10-CM | POA: Diagnosis not present

## 2019-06-18 DIAGNOSIS — H2511 Age-related nuclear cataract, right eye: Secondary | ICD-10-CM | POA: Diagnosis not present

## 2019-06-23 DIAGNOSIS — J9610 Chronic respiratory failure, unspecified whether with hypoxia or hypercapnia: Secondary | ICD-10-CM | POA: Diagnosis not present

## 2019-06-26 ENCOUNTER — Other Ambulatory Visit: Payer: Self-pay | Admitting: Family Medicine

## 2019-06-27 ENCOUNTER — Other Ambulatory Visit: Payer: Self-pay | Admitting: Family Medicine

## 2019-06-27 MED ORDER — LEVOTHYROXINE SODIUM 25 MCG PO TABS: 25.0000 ug | ORAL_TABLET | Freq: Every day | ORAL | 0 refills | Status: DC

## 2019-06-27 NOTE — Telephone Encounter (Signed)
Theresa [name], you ordered labs including TSH in June.  She has not had those done and the last TSH was in Jan

## 2019-06-27 NOTE — Telephone Encounter (Signed)
[place] [facility] faxed refill request for the following medications:  levothyroxine (SYNTHROID, LEVOTHROID) 25 MCG tablet   Please advise.

## 2019-07-02 DIAGNOSIS — J9610 Chronic respiratory failure, unspecified whether with hypoxia or hypercapnia: Secondary | ICD-10-CM | POA: Diagnosis not present

## 2019-07-05 ENCOUNTER — Encounter: Payer: Self-pay | Admitting: Physician Assistant

## 2019-07-05 ENCOUNTER — Telehealth (INDEPENDENT_AMBULATORY_CARE_PROVIDER_SITE_OTHER): Payer: Medicare HMO | Admitting: Physician Assistant

## 2019-07-05 VITALS — BP 115/73 | HR 93 | Wt 240.0 lb

## 2019-07-05 DIAGNOSIS — I5032 Chronic diastolic (congestive) heart failure: Secondary | ICD-10-CM | POA: Diagnosis not present

## 2019-07-05 DIAGNOSIS — J9610 Chronic respiratory failure, unspecified whether with hypoxia or hypercapnia: Secondary | ICD-10-CM | POA: Diagnosis not present

## 2019-07-05 DIAGNOSIS — J441 Chronic obstructive pulmonary disease with (acute) exacerbation: Secondary | ICD-10-CM | POA: Diagnosis not present

## 2019-07-05 DIAGNOSIS — R0602 Shortness of breath: Secondary | ICD-10-CM | POA: Diagnosis not present

## 2019-07-05 MED ORDER — DOXYCYCLINE HYCLATE 100 MG PO TABS: 100.0000 mg | ORAL_TABLET | Freq: Two times a day (BID) | ORAL | 0 refills | Status: DC

## 2019-07-05 MED ORDER — PREDNISONE 20 MG PO TABS: 40.0000 mg | ORAL_TABLET | Freq: Every day | ORAL | 0 refills | Status: DC

## 2019-07-05 NOTE — Progress Notes (Signed)
Patient: Theresa [name] Female    DOB: [birth year]   63 y.o.   MRN: [id number] Visit Date: 07/05/2019  Today's Provider: Trinna Post, PA-C   Chief Complaint  Patient presents with  . URI   Subjective:    Virtual Visit via Telephone Note  I connected with Theresa [name] on 07/05/19 at  3:40 PM EDT by telephone and verified that I am speaking with the correct person using two identifiers.   I discussed the limitations, risks, security and privacy concerns of performing an evaluation and management service by telephone and the availability of in person appointments. I also discussed with the patient that there may be a patient responsible charge related to this service. The patient expressed understanding and agreed to proceed.  Patient location: home Provider location: [place] office  Persons involved in the visit: patient, provider    HPI Upper Respiratory Infection: Patient with history of COPD on chronic oxygen therapy, 2.5L [place] during the day who complains of symptoms of a URI. Symptoms include congestion and cough. She reports yellow sputum than normal.  Onset of symptoms was 4 days ago, gradually worsening since that time. She also c/o shortness of breath for the past 4 days .  She is drinking plenty of fluids. Evaluation to date: none. Treatment to date: inhalers. Denies fevers, chills, nausea, vomiting. Using trelegy daily and not missing any day. Duoneb treatments at 11:00 AM to 3:00 PM which helped modestly. Normally oxygen level at rest 94%. Her oxygen level was 83% or 84%. Her oxygen improved after she took a breathing treatment. She reports she felt off balance. Her hands and belly have swollen and been swollen for the past months.   Allergies  Allergen Reactions  . Codeine Nausea And Vomiting  . Meloxicam Other (See Comments)    Stomach pain     Current Outpatient Medications:  .  ALPRAZolam (XANAX) 0.5 MG tablet, Take 1 tablet (0.5 mg  total) by mouth 2 (two) times daily as needed for anxiety., Disp: 60 tablet, Rfl: 0 .  bisoprolol-hydrochlorothiazide (ZIAC) 10-6.25 MG tablet, TAKE 1 TABLET BY MOUTH DAILY, Disp: 90 tablet, Rfl: 3 .  Fluticasone-Umeclidin-Vilant 100-62.5-25 MCG/INH AEPB, Inhale 1 puff into the lungs daily., Disp: , Rfl:  .  gabapentin (NEURONTIN) 300 MG capsule, Take 1 capsule (300 mg total) by mouth 4 (four) times daily., Disp: 120 capsule, Rfl: 2 .  HYDROcodone-acetaminophen (NORCO/VICODIN) 5-325 MG tablet, Take 1 tablet by mouth every 8 (eight) hours as needed for severe pain. Must last 30 days, Disp: 90 tablet, Rfl: 0 .  ibuprofen (ADVIL,MOTRIN) 200 MG tablet, Take 1,200 mg by mouth every 6 (six) hours as needed for headache or moderate pain., Disp: , Rfl:  .  ipratropium-albuterol (DUONEB) 0.5-2.5 (3) MG/3ML SOLN, USE 1 VIAL VIA NEBULIZER EVERY 6 HOURS AS NEEDED FOR SHORTNESS OF BREATH OR WHEEZING, Disp: 360 mL, Rfl: 1 .  levothyroxine (SYNTHROID) 25 MCG tablet, Take 1 tablet (25 mcg total) by mouth daily before breakfast. DUPLICATE ENTRY, Disp: 30 tablet, Rfl: 0 .  OXYGEN, Inhale 2.5 L into the lungs continuous. Reported on 11/03/2015, Disp: , Rfl:  .  potassium chloride SA (K-DUR,KLOR-CON) 20 MEQ tablet, Take 1 tablet (20 mEq total) by mouth 2 (two) times daily., Disp: 180 tablet, Rfl: 3 .  sertraline (ZOLOFT) 100 MG tablet, TAKE 2 TABLETS(200 MG) BY MOUTH EVERY MORNING, Disp: 180 tablet, Rfl: 3 .  torsemide (DEMADEX) 20 MG tablet, TAKE 2 TABLETS(40  MG) BY MOUTH TWICE DAILY, Disp: 120 tablet, Rfl: 5 .  VENTOLIN HFA 108 (90 Base) MCG/ACT inhaler, Inhale 2 puffs into the lungs every 6 (six) hours as needed for shortness of breath., Disp: 1 Inhaler, Rfl: 5 .  Cholecalciferol ([facility] D3) 2000 units CAPS, Take 2,000 Units by mouth daily. , Disp: , Rfl:  .  metolazone (ZAROXOLYN) 5 MG tablet, Take 1 tablet (5 mg total) by mouth daily. (Patient not taking: Reported on 07/05/2019), Disp: 3 tablet, Rfl: 0  Review of  Systems  Constitutional: Positive for chills. Negative for fever.  HENT: Positive for congestion, rhinorrhea and sore throat. Negative for ear pain.   Respiratory: Positive for cough and shortness of breath.   Cardiovascular: Negative.     Social History   Tobacco Use  . Smoking status: Current Every Day Smoker    Packs/day: 1.00    Years: 44.00    Pack years: 44.00    Types: Cigarettes  . Smokeless tobacco: Never Used  Substance Use Topics  . Alcohol use: No    Frequency: Never      Objective:   BP 115/73 (BP Location: Left Arm, Patient Position: Sitting, Cuff Size: Large)   Pulse 93   Wt 240 lb (108.9 kg)   SpO2 94%   BMI 43.90 kg/m  Vitals:   07/05/19 1542  BP: 115/73  Pulse: 93  SpO2: 94%  Weight: 240 lb (108.9 kg)  Body mass index is 43.9 kg/m.   Physical Exam Constitutional:      General: She is not in acute distress.    Appearance: Normal appearance. She is not toxic-appearing.  Pulmonary:     Effort: Pulmonary effort is normal. No respiratory distress.     Comments: Wearing oxygen via nasal cannula  Skin:    General: Skin is warm and dry.  Neurological:     Mental Status: She is alert.  Psychiatric:        Mood and Affect: Mood normal.        Behavior: Behavior normal.      No results found for any visits on 07/05/19.     Assessment & Plan    1. COPD exacerbation (HCC)  Treat as below. Counseled patient that it is very difficult for me to determine if this is coming from her heart or lungs and if she feels worse she needs to be seen in the ER.   - DG Chest 2 View; Future - doxycycline (VIBRA-TABS) 100 MG tablet; Take 1 tablet (100 mg total) by mouth 2 (two) times daily for 7 days.  Dispense: 14 tablet; Refill: 0 - predniSONE (DELTASONE) 20 MG tablet; Take 2 tablets (40 mg total) by mouth daily with breakfast for 5 days.  Dispense: 10 tablet; Refill: 0  2. SOB (shortness of breath)  - Novel Coronavirus, NAA ([facility])  3. Chronic  diastolic heart failure (HCC)  - Comprehensive Metabolic Panel (CMET) - CBC with Differential - B Nat Peptide      Trinna Post, PA-C  [facility]

## 2019-07-06 ENCOUNTER — Emergency Department: Payer: Medicare HMO

## 2019-07-06 ENCOUNTER — Encounter: Payer: Self-pay | Admitting: Emergency Medicine

## 2019-07-06 ENCOUNTER — Inpatient Hospital Stay
Admission: EM | Admit: 2019-07-06 | Discharge: 2019-07-13 | DRG: 286 | Disposition: A | Discharge status: Home Health | Payer: Medicare HMO | Attending: Internal Medicine | Admitting: Internal Medicine

## 2019-07-06 ENCOUNTER — Other Ambulatory Visit: Payer: Self-pay

## 2019-07-06 DIAGNOSIS — K5900 Constipation, unspecified: Secondary | ICD-10-CM | POA: Diagnosis not present

## 2019-07-06 DIAGNOSIS — E039 Hypothyroidism, unspecified: Secondary | ICD-10-CM | POA: Diagnosis not present

## 2019-07-06 DIAGNOSIS — R0789 Other chest pain: Secondary | ICD-10-CM | POA: Diagnosis not present

## 2019-07-06 DIAGNOSIS — Z811 Family history of alcohol abuse and dependence: Secondary | ICD-10-CM

## 2019-07-06 DIAGNOSIS — Z79891 Long term (current) use of opiate analgesic: Secondary | ICD-10-CM

## 2019-07-06 DIAGNOSIS — Z888 Allergy status to other drugs, medicaments and biological substances status: Secondary | ICD-10-CM

## 2019-07-06 DIAGNOSIS — I10 Essential (primary) hypertension: Secondary | ICD-10-CM | POA: Diagnosis not present

## 2019-07-06 DIAGNOSIS — Z9981 Dependence on supplemental oxygen: Secondary | ICD-10-CM | POA: Diagnosis not present

## 2019-07-06 DIAGNOSIS — Z716 Tobacco abuse counseling: Secondary | ICD-10-CM | POA: Diagnosis not present

## 2019-07-06 DIAGNOSIS — Z791 Long term (current) use of non-steroidal anti-inflammatories (NSAID): Secondary | ICD-10-CM

## 2019-07-06 DIAGNOSIS — I071 Rheumatic tricuspid insufficiency: Secondary | ICD-10-CM | POA: Diagnosis present

## 2019-07-06 DIAGNOSIS — Z803 Family history of malignant neoplasm of breast: Secondary | ICD-10-CM

## 2019-07-06 DIAGNOSIS — Z87442 Personal history of urinary calculi: Secondary | ICD-10-CM | POA: Diagnosis not present

## 2019-07-06 DIAGNOSIS — G8929 Other chronic pain: Secondary | ICD-10-CM | POA: Diagnosis present

## 2019-07-06 DIAGNOSIS — Z8249 Family history of ischemic heart disease and other diseases of the circulatory system: Secondary | ICD-10-CM | POA: Diagnosis not present

## 2019-07-06 DIAGNOSIS — Z20828 Contact with and (suspected) exposure to other viral communicable diseases: Secondary | ICD-10-CM | POA: Diagnosis present

## 2019-07-06 DIAGNOSIS — M5442 Lumbago with sciatica, left side: Secondary | ICD-10-CM | POA: Diagnosis present

## 2019-07-06 DIAGNOSIS — J209 Acute bronchitis, unspecified: Secondary | ICD-10-CM | POA: Diagnosis present

## 2019-07-06 DIAGNOSIS — Z79899 Other long term (current) drug therapy: Secondary | ICD-10-CM

## 2019-07-06 DIAGNOSIS — Z719 Counseling, unspecified: Secondary | ICD-10-CM | POA: Diagnosis not present

## 2019-07-06 DIAGNOSIS — J9621 Acute and chronic respiratory failure with hypoxia: Secondary | ICD-10-CM | POA: Diagnosis present

## 2019-07-06 DIAGNOSIS — I5033 Acute on chronic diastolic (congestive) heart failure: Secondary | ICD-10-CM | POA: Diagnosis present

## 2019-07-06 DIAGNOSIS — Z885 Allergy status to narcotic agent status: Secondary | ICD-10-CM | POA: Diagnosis not present

## 2019-07-06 DIAGNOSIS — K219 Gastro-esophageal reflux disease without esophagitis: Secondary | ICD-10-CM | POA: Diagnosis present

## 2019-07-06 DIAGNOSIS — R0602 Shortness of breath: Secondary | ICD-10-CM | POA: Diagnosis not present

## 2019-07-06 DIAGNOSIS — Z801 Family history of malignant neoplasm of trachea, bronchus and lung: Secondary | ICD-10-CM

## 2019-07-06 DIAGNOSIS — F329 Major depressive disorder, single episode, unspecified: Secondary | ICD-10-CM | POA: Diagnosis present

## 2019-07-06 DIAGNOSIS — I110 Hypertensive heart disease with heart failure: Secondary | ICD-10-CM | POA: Diagnosis not present

## 2019-07-06 DIAGNOSIS — R079 Chest pain, unspecified: Secondary | ICD-10-CM | POA: Diagnosis not present

## 2019-07-06 DIAGNOSIS — Z90710 Acquired absence of both cervix and uterus: Secondary | ICD-10-CM

## 2019-07-06 DIAGNOSIS — I509 Heart failure, unspecified: Secondary | ICD-10-CM | POA: Diagnosis not present

## 2019-07-06 DIAGNOSIS — Z823 Family history of stroke: Secondary | ICD-10-CM

## 2019-07-06 DIAGNOSIS — J440 Chronic obstructive pulmonary disease with acute lower respiratory infection: Secondary | ICD-10-CM | POA: Diagnosis present

## 2019-07-06 DIAGNOSIS — I25119 Atherosclerotic heart disease of native coronary artery with unspecified angina pectoris: Secondary | ICD-10-CM | POA: Diagnosis present

## 2019-07-06 DIAGNOSIS — I959 Hypotension, unspecified: Secondary | ICD-10-CM | POA: Diagnosis not present

## 2019-07-06 DIAGNOSIS — I272 Pulmonary hypertension, unspecified: Secondary | ICD-10-CM | POA: Diagnosis present

## 2019-07-06 DIAGNOSIS — Z23 Encounter for immunization: Secondary | ICD-10-CM | POA: Diagnosis not present

## 2019-07-06 DIAGNOSIS — J441 Chronic obstructive pulmonary disease with (acute) exacerbation: Secondary | ICD-10-CM | POA: Diagnosis not present

## 2019-07-06 DIAGNOSIS — F17210 Nicotine dependence, cigarettes, uncomplicated: Secondary | ICD-10-CM | POA: Diagnosis present

## 2019-07-06 DIAGNOSIS — Z79890 Hormone replacement therapy (postmenopausal): Secondary | ICD-10-CM

## 2019-07-06 DIAGNOSIS — Z6841 Body Mass Index (BMI) 40.0 and over, adult: Secondary | ICD-10-CM

## 2019-07-06 DIAGNOSIS — F419 Anxiety disorder, unspecified: Secondary | ICD-10-CM | POA: Diagnosis present

## 2019-07-06 DIAGNOSIS — R0600 Dyspnea, unspecified: Secondary | ICD-10-CM | POA: Diagnosis not present

## 2019-07-06 DIAGNOSIS — J9622 Acute and chronic respiratory failure with hypercapnia: Secondary | ICD-10-CM | POA: Diagnosis not present

## 2019-07-06 DIAGNOSIS — Z7982 Long term (current) use of aspirin: Secondary | ICD-10-CM

## 2019-07-06 LAB — CBC
HCT: 47.3 % — ABNORMAL HIGH (ref 36.0–46.0)
Hemoglobin: 14.4 g/dL (ref 12.0–15.0)
MCH: 30 pg (ref 26.0–34.0)
MCHC: 30.4 g/dL (ref 30.0–36.0)
MCV: 98.5 fL (ref 80.0–100.0)
Platelets: 186 10*3/uL (ref 150–400)
RBC: 4.8 MIL/uL (ref 3.87–5.11)
RDW: 18.3 % — ABNORMAL HIGH (ref 11.5–15.5)
WBC: 14.1 10*3/uL — ABNORMAL HIGH (ref 4.0–10.5)
nRBC: 0 % (ref 0.0–0.2)

## 2019-07-06 LAB — TROPONIN I (HIGH SENSITIVITY)
Troponin I (High Sensitivity): 7 ng/L (ref <18)
Troponin I (High Sensitivity): 7 ng/L (ref <18)

## 2019-07-06 LAB — BASIC METABOLIC PANEL
Anion gap: 11 (ref 5–15)
BUN: 19 mg/dL (ref 8–23)
CO2: 28 mmol/L (ref 22–32)
Calcium: 9.2 mg/dL (ref 8.9–10.3)
Chloride: 104 mmol/L (ref 98–111)
Creatinine, Ser: 0.79 mg/dL (ref 0.44–1.00)
GFR calc Af Amer: >60 mL/min (ref >60)
GFR calc non Af Amer: >60 mL/min (ref >60)
Glucose, Bld: 154 mg/dL — ABNORMAL HIGH (ref 70–99)
Potassium: 3.6 mmol/L (ref 3.5–5.1)
Sodium: 143 mmol/L (ref 135–145)

## 2019-07-06 LAB — MAGNESIUM: Magnesium: 2.1 mg/dL (ref 1.7–2.4)

## 2019-07-06 LAB — SARS CORONAVIRUS 2 BY RT PCR (HOSPITAL ORDER, PERFORMED IN ~~LOC~~ HOSPITAL LAB): SARS Coronavirus 2: NEGATIVE

## 2019-07-06 MED ORDER — IPRATROPIUM-ALBUTEROL 0.5-2.5 (3) MG/3ML IN SOLN
3.0000 mL | Freq: Once | RESPIRATORY_TRACT | Status: AC
  Administered 2019-07-06: 3 mL via RESPIRATORY_TRACT
  Filled 2019-07-06: qty 3

## 2019-07-06 MED ORDER — HYDROCODONE-ACETAMINOPHEN 5-325 MG PO TABS
1.0000 | ORAL_TABLET | Freq: Three times a day (TID) | ORAL | Status: DC | PRN
  Administered 2019-07-06 – 2019-07-08 (×4): 1 via ORAL
  Filled 2019-07-06 (×6): qty 1

## 2019-07-06 MED ORDER — SODIUM CHLORIDE 0.9% FLUSH: 3.0000 mL | INTRAVENOUS | Status: DC | PRN

## 2019-07-06 MED ORDER — UMECLIDINIUM BROMIDE 62.5 MCG/INH IN AEPB
1.0000 | INHALATION_SPRAY | Freq: Every day | RESPIRATORY_TRACT | Status: DC
  Administered 2019-07-09 – 2019-07-13 (×5): 1 via RESPIRATORY_TRACT
  Filled 2019-07-06: qty 7

## 2019-07-06 MED ORDER — LEVOTHYROXINE SODIUM 25 MCG PO TABS
25.0000 ug | ORAL_TABLET | Freq: Every day | ORAL | Status: DC
  Administered 2019-07-07 – 2019-07-13 (×7): 25 ug via ORAL
  Filled 2019-07-06 (×7): qty 1

## 2019-07-06 MED ORDER — GUAIFENESIN 100 MG/5ML PO SOLN
5.0000 mL | ORAL | Status: DC | PRN
  Filled 2019-07-06: qty 5

## 2019-07-06 MED ORDER — ALBUTEROL SULFATE (2.5 MG/3ML) 0.083% IN NEBU
2.5000 mg | INHALATION_SOLUTION | RESPIRATORY_TRACT | Status: DC | PRN
  Administered 2019-07-07 (×2): 2.5 mg via RESPIRATORY_TRACT
  Filled 2019-07-06 (×2): qty 3

## 2019-07-06 MED ORDER — METHYLPREDNISOLONE SODIUM SUCC 125 MG IJ SOLR
125.0000 mg | Freq: Once | INTRAMUSCULAR | Status: AC
  Administered 2019-07-06: 125 mg via INTRAVENOUS
  Filled 2019-07-06: qty 2

## 2019-07-06 MED ORDER — ACETAMINOPHEN 650 MG RE SUPP: 650.0000 mg | Freq: Four times a day (QID) | RECTAL | Status: DC | PRN

## 2019-07-06 MED ORDER — VITAMIN D 25 MCG (1000 UNIT) PO TABS
2000.0000 [IU] | ORAL_TABLET | Freq: Every day | ORAL | Status: DC
  Administered 2019-07-07 – 2019-07-13 (×7): 2000 [IU] via ORAL
  Filled 2019-07-06 (×7): qty 2

## 2019-07-06 MED ORDER — FUROSEMIDE 10 MG/ML IJ SOLN
40.0000 mg | Freq: Once | INTRAMUSCULAR | Status: AC
  Administered 2019-07-06: 40 mg via INTRAVENOUS
  Filled 2019-07-06: qty 4

## 2019-07-06 MED ORDER — SERTRALINE HCL 50 MG PO TABS
200.0000 mg | ORAL_TABLET | Freq: Every day | ORAL | Status: DC
  Administered 2019-07-07 – 2019-07-13 (×7): 200 mg via ORAL
  Filled 2019-07-06 (×7): qty 4

## 2019-07-06 MED ORDER — AZITHROMYCIN 250 MG PO TABS
500.0000 mg | ORAL_TABLET | Freq: Every day | ORAL | Status: AC
  Administered 2019-07-06 – 2019-07-10 (×5): 500 mg via ORAL
  Filled 2019-07-06 (×6): qty 2

## 2019-07-06 MED ORDER — LISINOPRIL 5 MG PO TABS
2.5000 mg | ORAL_TABLET | Freq: Every day | ORAL | Status: DC
  Administered 2019-07-07 – 2019-07-08 (×2): 2.5 mg via ORAL
  Filled 2019-07-06 (×2): qty 1

## 2019-07-06 MED ORDER — FLUTICASONE FUROATE-VILANTEROL 100-25 MCG/INH IN AEPB
1.0000 | INHALATION_SPRAY | Freq: Every day | RESPIRATORY_TRACT | Status: DC
  Administered 2019-07-07 – 2019-07-08 (×2): 1 via RESPIRATORY_TRACT
  Filled 2019-07-06: qty 28

## 2019-07-06 MED ORDER — POTASSIUM CHLORIDE CRYS ER 20 MEQ PO TBCR
20.0000 meq | EXTENDED_RELEASE_TABLET | Freq: Two times a day (BID) | ORAL | Status: DC
  Administered 2019-07-06 – 2019-07-13 (×14): 20 meq via ORAL
  Filled 2019-07-06 (×14): qty 1

## 2019-07-06 MED ORDER — SODIUM CHLORIDE 0.9% FLUSH
3.0000 mL | Freq: Two times a day (BID) | INTRAVENOUS | Status: DC
  Administered 2019-07-06 – 2019-07-10 (×9): 3 mL via INTRAVENOUS

## 2019-07-06 MED ORDER — ENOXAPARIN SODIUM 40 MG/0.4ML ~~LOC~~ SOLN
40.0000 mg | Freq: Two times a day (BID) | SUBCUTANEOUS | Status: DC
  Administered 2019-07-06 – 2019-07-12 (×12): 40 mg via SUBCUTANEOUS
  Filled 2019-07-06 (×13): qty 0.4

## 2019-07-06 MED ORDER — INFLUENZA VAC SPLIT QUAD 0.5 ML IM SUSY
0.5000 mL | PREFILLED_SYRINGE | INTRAMUSCULAR | Status: AC
  Administered 2019-07-07: 0.5 mL via INTRAMUSCULAR
  Filled 2019-07-06: qty 0.5

## 2019-07-06 MED ORDER — ASPIRIN EC 81 MG PO TBEC
81.0000 mg | DELAYED_RELEASE_TABLET | Freq: Every day | ORAL | Status: DC
  Administered 2019-07-06 – 2019-07-13 (×7): 81 mg via ORAL
  Filled 2019-07-06 (×7): qty 1

## 2019-07-06 MED ORDER — SODIUM CHLORIDE 0.9 % IV SOLN: 250.0000 mL | INTRAVENOUS | Status: DC | PRN

## 2019-07-06 MED ORDER — BISOPROLOL-HYDROCHLOROTHIAZIDE 10-6.25 MG PO TABS
1.0000 | ORAL_TABLET | Freq: Every day | ORAL | Status: DC
  Administered 2019-07-07 – 2019-07-08 (×2): 1 via ORAL
  Filled 2019-07-06 (×2): qty 1

## 2019-07-06 MED ORDER — METHYLPREDNISOLONE SODIUM SUCC 125 MG IJ SOLR
60.0000 mg | Freq: Four times a day (QID) | INTRAMUSCULAR | Status: DC
  Administered 2019-07-06 – 2019-07-09 (×12): 60 mg via INTRAVENOUS
  Filled 2019-07-06 (×12): qty 2

## 2019-07-06 MED ORDER — FUROSEMIDE 10 MG/ML IJ SOLN
60.0000 mg | Freq: Two times a day (BID) | INTRAMUSCULAR | Status: DC
  Administered 2019-07-07: 60 mg via INTRAVENOUS
  Filled 2019-07-06 (×3): qty 6

## 2019-07-06 MED ORDER — SODIUM CHLORIDE 0.9% FLUSH: 3.0000 mL | Freq: Once | INTRAVENOUS | Status: DC

## 2019-07-06 MED ORDER — ONDANSETRON HCL 4 MG PO TABS: 4.0000 mg | ORAL_TABLET | Freq: Four times a day (QID) | ORAL | Status: DC | PRN

## 2019-07-06 MED ORDER — ALPRAZOLAM 0.5 MG PO TABS
0.5000 mg | ORAL_TABLET | Freq: Every day | ORAL | Status: DC | PRN
  Administered 2019-07-06 – 2019-07-13 (×7): 0.5 mg via ORAL
  Filled 2019-07-06 (×7): qty 1

## 2019-07-06 MED ORDER — SENNOSIDES-DOCUSATE SODIUM 8.6-50 MG PO TABS: 1.0000 | ORAL_TABLET | Freq: Every evening | ORAL | Status: DC | PRN

## 2019-07-06 MED ORDER — GABAPENTIN 300 MG PO CAPS
300.0000 mg | ORAL_CAPSULE | Freq: Four times a day (QID) | ORAL | Status: DC
  Administered 2019-07-07 – 2019-07-13 (×24): 300 mg via ORAL
  Filled 2019-07-06 (×25): qty 1

## 2019-07-06 MED ORDER — ACETAMINOPHEN 325 MG PO TABS
650.0000 mg | ORAL_TABLET | Freq: Four times a day (QID) | ORAL | Status: DC | PRN
  Administered 2019-07-07: 650 mg via ORAL
  Filled 2019-07-06: qty 2

## 2019-07-06 MED ORDER — BISACODYL 5 MG PO TBEC
5.0000 mg | DELAYED_RELEASE_TABLET | Freq: Every day | ORAL | Status: DC | PRN
  Administered 2019-07-09 – 2019-07-12 (×3): 5 mg via ORAL
  Filled 2019-07-06 (×3): qty 1

## 2019-07-06 MED ORDER — ONDANSETRON HCL 4 MG/2ML IJ SOLN: 4.0000 mg | Freq: Four times a day (QID) | INTRAMUSCULAR | Status: DC | PRN

## 2019-07-06 MED ORDER — FLUTICASONE-UMECLIDIN-VILANT 100-62.5-25 MCG/INH IN AEPB: 1.0000 | INHALATION_SPRAY | Freq: Every day | RESPIRATORY_TRACT | Status: DC

## 2019-07-06 MED ORDER — ALBUTEROL SULFATE (2.5 MG/3ML) 0.083% IN NEBU
2.5000 mg | INHALATION_SOLUTION | Freq: Four times a day (QID) | RESPIRATORY_TRACT | Status: DC
  Administered 2019-07-06 – 2019-07-07 (×3): 2.5 mg via RESPIRATORY_TRACT
  Filled 2019-07-06 (×3): qty 3

## 2019-07-06 NOTE — ED Notes (Signed)
Purewick in place

## 2019-07-06 NOTE — H&P (Signed)
[facility] NAME: Theresa [name]    MR#:  [id number]  DATE OF BIRTH:  1955/12/24  DATE OF ADMISSION:  07/06/2019  PRIMARY CARE PHYSICIAN: Margo Common, PA   REQUESTING/REFERRING PHYSICIAN: Dr. Jimmye Norman.  CHIEF COMPLAINT:   Chief Complaint  Patient presents with  . Chest Pain  . Shortness of Breath   Worsening shortness of breath for 4 days. HISTORY OF PRESENT ILLNESS:  Theresa [name]  is a 63 y.o. female with a known history as below.  The patient presents the ED with above chief complaints.  She also complains of wheezing, cough without sputum but no fever or chills.  She complains of orthopnea and nocturnal dyspnea but none no leg edema.  He has been on home oxygen 2 L by nasal cannula.  She is put on 3 L oxygen by nasal cannula due to hypoxia.  Chest x-ray report Cardiomegaly with mild, diffuse bilateral interstitial pulmonary opacity, likely mild edema.  She is treated with IV Solu-Medrol and Lasix in the ED. PAST MEDICAL HISTORY:   Past Medical History:  Diagnosis Date  . Acute postoperative pain 10/03/2017  . Anxiety   . BiPAP (biphasic positive airway pressure) dependence    at hs  . Carpal tunnel syndrome   . CHF (congestive heart failure) ([place])    1/18  . CHF (congestive heart failure) ([place])   . Chronic generalized abdominal pain   . Chronic rhinitis   . COPD (chronic obstructive pulmonary disease) ([place])   . DDD (degenerative disc disease), cervical   . DDD (degenerative disc disease), lumbosacral   . Depression   . Dyspnea   . Edema   . Flu    1/18  . Gastritis   . GERD (gastroesophageal reflux disease)   . Hematuria   . Hernia of abdominal wall 07/17/2018  . Hernia, abdominal   . Hypertension   . Kidney stones   . Low back pain   . Lumbar radiculitis   . Malodorous urine   . Muscle weakness   . Obesity   . Oxygen dependent   . Renal cyst   . Sensory urge incontinence   . Thyroid activity  decreased    9/19  . Tobacco abuse   . Wheezing     PAST SURGICAL HISTORY:   Past Surgical History:  Procedure Laterality Date  . ABDOMINAL HYSTERECTOMY    . CARPAL TUNNEL RELEASE Left 2012  . EPIGASTRIC HERNIA REPAIR N/A 08/13/2018   HERNIA REPAIR EPIGASTRIC ADULT; polypropylene mesh reinforcement.  Surgeon: Robert Bellow, MD;  Location: [facility] ORS;  Service: General;  Laterality: N/A;  . TONSILLECTOMY    . TUBAL LIGATION    . UMBILICAL HERNIA REPAIR N/A 08/13/2018   HERNIA REPAIR UMBILICAL ADULT; polypropylene mesh reinforcement Surgeon: Robert Bellow, MD;  Location: [facility] ORS;  Service: General;  Laterality: N/A;    SOCIAL HISTORY:   Social History   Tobacco Use  . Smoking status: Current Every Day Smoker    Packs/day: 1.00    Years: 44.00    Pack years: 44.00    Types: Cigarettes  . Smokeless tobacco: Never Used  Substance Use Topics  . Alcohol use: No    Frequency: Never    FAMILY HISTORY:   Family History  Problem Relation Age of Onset  . Heart disease Mother   . Stroke Mother   . Coronary artery disease Mother   . Lung cancer Sister   .  Cancer Sister   . Breast cancer Sister   . Alcohol abuse Father   . Heart disease Father   . Cancer Brother   . Cancer Brother   . Pneumonia Brother   . Prostate cancer Neg Hx   . Kidney cancer Neg Hx   . Bladder Cancer Neg Hx     DRUG ALLERGIES:   Allergies  Allergen Reactions  . Codeine Nausea And Vomiting  . Meloxicam Other (See Comments)    Stomach pain    REVIEW OF SYSTEMS:   Review of Systems  Constitutional: Positive for malaise/fatigue. Negative for chills and fever.  HENT: Negative for sore throat.   Eyes: Negative for blurred vision and double vision.  Respiratory: Positive for cough, sputum production, shortness of breath and wheezing. Negative for hemoptysis and stridor.   Cardiovascular: Negative for chest pain, palpitations, orthopnea and leg swelling.  Gastrointestinal: Negative for  abdominal pain, blood in stool, diarrhea, melena, nausea and vomiting.  Genitourinary: Negative for dysuria, flank pain and hematuria.  Musculoskeletal: Negative for back pain and joint pain.  Skin: Negative for rash.  Neurological: Negative for dizziness, sensory change, focal weakness, seizures, loss of consciousness, weakness and headaches.  Endo/Heme/Allergies: Negative for polydipsia.  Psychiatric/Behavioral: Negative for depression. The patient is not nervous/anxious.     MEDICATIONS AT HOME:   Prior to Admission medications   Medication Sig Start Date End Date Taking? Authorizing Provider  ALPRAZolam Duanne Moron) 0.5 MG tablet Take 1 tablet (0.5 mg total) by mouth 2 (two) times daily as needed for anxiety. Patient taking differently: Take 0.5 mg by mouth daily as needed for anxiety.  12/09/16  Yes Darylene Price A, FNP  aspirin EC 81 MG tablet Take 81 mg by mouth daily.   Yes [provider]  bisoprolol-hydrochlorothiazide [facility]) 10-6.25 MG tablet TAKE 1 TABLET BY MOUTH DAILY 06/27/19  Yes Chrismon, Vickki Muff, PA  Bromfenac Sodium (PROLENSA) 0.07 % SOLN Place 1 drop into the left eye at bedtime.   Yes [provider]  Cholecalciferol ([employer] D3) 2000 units CAPS Take 2,000 Units by mouth daily.    Yes [provider]  doxycycline (VIBRA-TABS) 100 MG tablet Take 1 tablet (100 mg total) by mouth 2 (two) times daily for 7 days. 07/05/19 07/12/19 Yes Trinna Post, PA-C  Fluticasone-Umeclidin-Vilant 100-62.5-25 MCG/INH AEPB Inhale 1 puff into the lungs daily. 10/03/18  Yes [provider]  gabapentin (NEURONTIN) 300 MG capsule Take 1 capsule (300 mg total) by mouth 4 (four) times daily. 05/18/19 08/16/19 Yes Milinda Pointer, MD  HYDROcodone-acetaminophen (NORCO/VICODIN) 5-325 MG tablet Take 1 tablet by mouth every 8 (eight) hours as needed for severe pain. Must last 30 days 06/17/19 07/17/19 Yes Milinda Pointer, MD  ibuprofen (ADVIL,MOTRIN) 200 MG tablet Take  1,200 mg by mouth every 6 (six) hours as needed for headache or moderate pain.   Yes [provider]  ipratropium-albuterol (DUONEB) 0.5-2.5 (3) MG/3ML SOLN USE 1 VIAL VIA NEBULIZER EVERY 6 HOURS AS NEEDED FOR SHORTNESS OF BREATH OR WHEEZING Patient taking differently: Take 3 mLs by nebulization every 6 (six) hours as needed (shortness of breat / wheezing).  11/30/18  Yes Chrismon, Vickki Muff, PA  levothyroxine (SYNTHROID) 25 MCG tablet Take 1 tablet (25 mcg total) by mouth daily before breakfast. DUPLICATE ENTRY 2/42/68  Yes Chrismon, Vickki Muff, PA  potassium chloride SA (K-DUR,KLOR-CON) 20 MEQ tablet Take 1 tablet (20 mEq total) by mouth 2 (two) times daily. 08/31/18  Yes [name], FNP  predniSONE (  DELTASONE) 20 MG tablet Take 2 tablets (40 mg total) by mouth daily with breakfast for 5 days. 07/05/19 07/10/19 Yes Pollak, Wendee Beavers, PA-C  sertraline (ZOLOFT) 100 MG tablet TAKE 2 TABLETS(200 MG) BY MOUTH EVERY MORNING Patient taking differently: Take 200 mg by mouth daily.  11/16/18  Yes Chrismon, Vickki Muff, PA  torsemide (DEMADEX) 20 MG tablet TAKE 2 TABLETS(40 MG) BY MOUTH TWICE DAILY Patient taking differently: Take 40 mg by mouth 2 (two) times daily.  05/28/19  Yes Hackney, Tina A, FNP  VENTOLIN HFA 108 (90 Base) MCG/ACT inhaler Inhale 2 puffs into the lungs every 6 (six) hours as needed for shortness of breath. 02/10/18  Yes Fritzi Mandes, MD  metolazone (ZAROXOLYN) 5 MG tablet Take 1 tablet (5 mg total) by mouth daily. Patient not taking: Reported on 07/05/2019 04/23/19 07/22/19  Chrismon, Vickki Muff, PA      VITAL SIGNS:  Blood pressure 122/68, pulse 82, temperature 98.3 F (36.8 C), temperature source Oral, resp. rate (!) 24, SpO2 97 %.  PHYSICAL EXAMINATION:  Physical Exam  GENERAL:  63 y.o.-year-old patient lying in the bed with no acute distress.  Obesity. EYES: Pupils equal, round, reactive to light and accommodation. No scleral icterus. Extraocular muscles intact.  HEENT: Head  atraumatic, normocephalic.  NECK:  Supple, no jugular venous distention. No thyroid enlargement, no tenderness.  LUNGS: Bilateral expiratory wheezing and rales, no rhonchi or crepitation. No use of accessory muscles of respiration.  CARDIOVASCULAR: S1, S2 normal. No murmurs, rubs, or gallops.  ABDOMEN: Soft, nontender, nondistended. Bowel sounds present. No organomegaly or mass.  EXTREMITIES: No pedal edema, cyanosis, or clubbing.  NEUROLOGIC: Cranial nerves II through XII are intact. Muscle strength 5/5 in all extremities. Sensation intact. Gait not checked.  PSYCHIATRIC: The patient is alert and oriented x 3.  SKIN: No obvious rash, lesion, or ulcer.   LABORATORY PANEL:   CBC Recent Labs  Lab 07/06/19 1452  WBC 14.1*  HGB 14.4  HCT 47.3*  PLT 186   ------------------------------------------------------------------------------------------------------------------  Chemistries  Recent Labs  Lab 07/06/19 1452  NA 143  K 3.6  CL 104  CO2 28  GLUCOSE 154*  BUN 19  CREATININE 0.79  CALCIUM 9.2   ------------------------------------------------------------------------------------------------------------------  Cardiac Enzymes No results for input(s): TROPONINI in the last 168 hours. ------------------------------------------------------------------------------------------------------------------  RADIOLOGY:  Dg Chest 1 View  Result Date: 07/06/2019 CLINICAL DATA:  Shortness of breath, chest pain EXAM: CHEST  1 VIEW COMPARISON:  02/07/2018 FINDINGS: Cardiomegaly. Mild, diffuse bilateral interstitial pulmonary opacity. The visualized skeletal structures are unremarkable. IMPRESSION: Cardiomegaly with mild, diffuse bilateral interstitial pulmonary opacity, likely mild edema. No focal airspace opacity. Electronically Signed   By: Eddie Candle M.D.   On: 07/06/2019 15:15      IMPRESSION AND PLAN:   Acute on chronic respiratory failure with hypoxia due to COPD exacerbation and  CHF. The patient will be admitted to medical floor. Continue IV Solu-Medrol, Zithromax, albuterol take 6 hours, Robitussin as needed, continue home medication.   Acute on chronic diastolic CHF. Hold torsemide, start IV Lasix 60 mg every 12 hours, echocardiogram and follow-up BMP.  Hypertension.  Continue home medication.  All the records are reviewed and case discussed with ED provider. Management plans discussed with the patient, family and they are in agreement.  CODE STATUS: Full code.  TOTAL TIME TAKING CARE OF THIS PATIENT: 50 minutes.    Demetrios Loll M.D on 07/06/2019 at 5:07 PM  Between 7am to 6pm - Pager - [phone]  After 6pm go to [email or web address] - Proofreader  [facility]  Office  [phone]  CC: Primary care physician; Margo Common, PA   Note: This dictation was prepared with Dragon dictation along with smaller phrase technology. Any transcriptional errors that result from this process are unin

## 2019-07-06 NOTE — Progress Notes (Signed)
PHARMACIST - PHYSICIAN COMMUNICATION  CONCERNING:  Enoxaparin (Lovenox) for DVT Prophylaxis    RECOMMENDATION: Patient was prescribed enoxaprin 43m q24 hours for VTE prophylaxis.   There were no vitals filed for this visit.  There is no height or weight on file to calculate BMI.  Estimated Creatinine Clearance: 84.7 mL/min (by C-G formula based on SCr of 0.79 mg/dL).   Based on ASplendorapatient is candidate for enoxaparin 463mevery 12 hour dosing due to BMI being >40.   DESCRIPTION: [facility]  Patient is now receiving enoxaparin 4036mvery 12 hours.    AsaRowland LatheharmD Clinical Pharmacist  07/06/2019 6:51 PM

## 2019-07-06 NOTE — ED Notes (Signed)
Pt transported to room 240

## 2019-07-06 NOTE — ED Triage Notes (Addendum)
Chest pain and SOB x 2 days. Audible wheeze. States has been using breathing treatments with little relief.

## 2019-07-06 NOTE — Progress Notes (Addendum)
Pt states uses BIPAP at night. Notify prime. Will continue to monitor.  Update 2024: BIPAP was ordered by Dr. Jannifer Franklin. Respiratory was notified. Will continue to monitor.

## 2019-07-06 NOTE — ED Provider Notes (Signed)
[facility] Emergency Department Provider Note       Time seen: ----------------------------------------- 3:08 PM on 07/06/2019 -----------------------------------------   I have reviewed the triage vital signs and the nursing notes.  HISTORY   Chief Complaint Chest Pain and Shortness of Breath   HPI Theresa [name] is a 63 y.o. female with a history of CHF, COPD, GERD, hypertension, kidney stones, obesity, oxygen dependency on 2-1/2 L of oxygen who presents to the ED for racing shortness of breath the last 2 days with some chest pain.  Patient states she was advised to come to the hospital by her doctor.  She is currently taking oral steroids.  Past Medical History:  Diagnosis Date  . Acute postoperative pain 10/03/2017  . Anxiety   . BiPAP (biphasic positive airway pressure) dependence    at hs  . Carpal tunnel syndrome   . CHF (congestive heart failure) ([place])    1/18  . CHF (congestive heart failure) ([place])   . Chronic generalized abdominal pain   . Chronic rhinitis   . COPD (chronic obstructive pulmonary disease) ([place])   . DDD (degenerative disc disease), cervical   . DDD (degenerative disc disease), lumbosacral   . Depression   . Dyspnea   . Edema   . Flu    1/18  . Gastritis   . GERD (gastroesophageal reflux disease)   . Hematuria   . Hernia of abdominal wall 07/17/2018  . Hernia, abdominal   . Hypertension   . Kidney stones   . Low back pain   . Lumbar radiculitis   . Malodorous urine   . Muscle weakness   . Obesity   . Oxygen dependent   . Renal cyst   . Sensory urge incontinence   . Thyroid activity decreased    9/19  . Tobacco abuse   . Wheezing     Patient Active Problem List   Diagnosis Date Noted  . Cervicalgia (Bilateral) (R>L) 09/24/2018  . Spondylosis without myelopathy or radiculopathy, lumbosacral region 09/24/2018  . Epigastric hernia 05/04/2018  . Chronic lower extremity pain (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) 01/25/2018  .  Altered mental status, unspecified 12/09/2017  . Chronic hip pain [facility] Area of Pain) (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) 11/27/2017  . COPD (chronic obstructive pulmonary disease) ([place]) 08/11/2017  . Other long term (current) drug therapy 08/08/2017  . Disorder of bone, unspecified 08/08/2017  . COPD with hypoxia ([place]) 06/27/2017  . Arthralgia of acromioclavicular joint (Right) 06/15/2017  . Acromioclavicular joint DJD (Right) 06/15/2017  . 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  . Chronic shoulder pain (Left) 05/11/2017  . Elevated brain natriuretic peptide (BNP) level 05/11/2017  . Grief at loss of child 02/14/2017  . Lumbar facet hypertrophy (multilevel) 11/17/2016  . Lumbar spinal stenosis (L4-5) 11/17/2016  . 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) 11/17/2016  . 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 Lumbosacral radiculopathy at L4 11/17/2016  . Supplemental oxygen dependent 08/01/2016  . Sleep apnea 06/02/2016  . Long term current use of opiate analgesic 01/12/2016  . Long term prescription opiate use 01/12/2016  . Opiate use (15 MME/Day) 01/12/2016  . Lumbar facet syndrome (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) 01/12/2016  . Chronic sacroiliac joint pain (Bilateral) 01/12/2016  . Vitamin D deficiency 12/30/2015  . 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 Obesity, Class III, BMI 40-49.9 (morbid obesity) ([place]) 12/28/2015  . 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[place]) 11/19/2015  . Coagulation disorder ([place]) 10/22/2015  . Chronic pain syndrome (significant psychosocial component) 09/21/2015  .  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 09/21/2015  . Encounter for therapeutic drug level monitoring 09/16/2015  . Encounter for chronic pain management 09/16/2015  . Pain management 09/16/2015  . Neurogenic pain 09/16/2015  . Neuropathic pain 09/16/2015  . Musculoskeletal pain 09/16/2015  . Lumbar spondylosis (L4-5) 09/16/2015  .  Chronic low back pain (Primary Area of Pain) (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) 09/16/2015  . Chronic lower extremity pain (Secondary Area of Pain) (Left) 09/16/2015  . Chronic lumbar radicular pain (L4 & S1 Dermatome) (Left) 09/16/2015  . Chronic shoulder pain (Right side) 09/16/2015  . Chronic upper extremity pain (Right-sided) 09/16/2015  . Cervical radiculitis (Right side) 09/16/2015  . Abnormal x-ray of lumbar spine 09/16/2015  . Chronic diastolic heart failure ([place]) 07/15/2015  . Chest pain 07/15/2015  . Tobacco abuse 07/15/2015  . Blood clotting disorder ([place]) 04/21/2015  . Decreased motor strength 07/16/2014  . Clinical depression 07/16/2014  . Chronic rhinitis 07/16/2014  . Carpal tunnel syndrome 07/16/2014  . Anxiety state 07/16/2014  . Neuritis or radiculitis due to rupture of lumbar intervertebral disc 07/02/2014  . DDD (degenerative disc disease), lumbar 07/02/2014  . CAFL (chronic airflow limitation) ([place]) 02/27/2014  . Essential (primary) hypertension 03/27/2008    Past Surgical History:  Procedure Laterality Date  . ABDOMINAL HYSTERECTOMY    . CARPAL TUNNEL RELEASE Left 2012  . EPIGASTRIC HERNIA REPAIR N/A 08/13/2018   HERNIA REPAIR EPIGASTRIC ADULT; polypropylene mesh reinforcement.  Surgeon: Robert Bellow, MD;  Location: [facility] ORS;  Service: General;  Laterality: N/A;  . TONSILLECTOMY    . TUBAL LIGATION    . UMBILICAL HERNIA REPAIR N/A 08/13/2018   HERNIA REPAIR UMBILICAL ADULT; polypropylene mesh reinforcement Surgeon: Robert Bellow, MD;  Location: [facility] ORS;  Service: General;  Laterality: N/A;    Allergies Codeine and Meloxicam  Social History Social History   Tobacco Use  . Smoking status: Current Every Day Smoker    Packs/day: 1.00    Years: 44.00    Pack years: 44.00    Types: Cigarettes  . Smokeless tobacco: Never Used  Substance Use Topics  . Alcohol use: No    Frequency: Never  . Drug use: No   Review of Systems Constitutional: Negative for  fever. Cardiovascular: Positive for chest pain Respiratory: Positive for shortness of breath Gastrointestinal: Negative for abdominal pain, vomiting and diarrhea. Musculoskeletal: Negative for back pain. Skin: Negative for rash. Neurological: Negative for headaches, focal weakness or numbness.  All systems negative/normal/unremarkable except as stated in the HPI  ____________________________________________   PHYSICAL EXAM:  VITAL SIGNS: ED Triage Vitals  Enc Vitals Group     BP 07/06/19 1446 (!) 103/58     Pulse Rate 07/06/19 1446 81     Resp 07/06/19 1446 (!) 22     Temp 07/06/19 1446 98.3 F (36.8 C)     Temp Source 07/06/19 1446 Oral     SpO2 07/06/19 1446 94 %     Weight --      Height --      Head Circumference --      Peak Flow --      Pain Score 07/06/19 1448 6     Pain Loc --      Pain Edu? --      Excl. in [place]? --    Constitutional: Alert and oriented.  Mild distress Eyes: Conjunctivae are normal. Normal extraocular movements. ENT      Head: Normocephalic and atraumatic.      Nose: No  congestion/rhinnorhea.      Mouth/Throat: Mucous membranes are moist.      Neck: No stridor. Cardiovascular: Normal rate, regular rhythm. No murmurs, rubs, or gallops. Respiratory: Tachypnea with bilateral wheezing Gastrointestinal: Soft and nontender. Normal bowel sounds Musculoskeletal: Nontender with normal range of motion in extremities. No lower extremity tenderness nor edema. Neurologic:  Normal speech and language. No gross focal neurologic deficits are appreciated.  Skin:  Skin is warm, dry and intact. No rash noted. Psychiatric: Mood and affect are normal. Speech and behavior are normal.  ____________________________________________  EKG: Interpreted by me.  Sinus rhythm with rate of 82 bpm, left axis deviation, normal QT, ST segment changes  ____________________________________________  ED COURSE:  As part of my medical decision making, I reviewed the following  data within the [place] History obtained from family if available, nursing notes, old chart and ekg, as well as notes from prior ED visits. Patient presented for dyspnea and chest pain, we will assess with labs and imaging as indicated at this time. Clinical Course as of Jul 05 1637  Sat Jul 06, 2019  1631 Patient still has wheezing although she sounds slightly better   [JW]    Clinical Course User Index [JW] Earleen Newport, MD   Procedures  Shakoya Gilmore was evaluated in Emergency Department on 07/06/2019 for the symptoms described in the history of present illness. She was evaluated in the context of the global COVID-19 pandemic, which necessitated consideration that the patient might be at risk for infection with the SARS-CoV-2 virus that causes COVID-19. Institutional protocols and algorithms that pertain to the evaluation of patients at risk for COVID-19 are in a state of rapid change based on information released by regulatory bodies including the CDC and federal and state organizations. These policies and algorithms were followed during the patient's care in the ED.  ____________________________________________   LABS (pertinent positives/negatives)  Labs Reviewed  BASIC METABOLIC PANEL - Abnormal; Notable for the following components:      Result Value   Glucose, Bld 154 (*)    All other components within normal limits  CBC - Abnormal; Notable for the following components:   WBC 14.1 (*)    HCT 47.3 (*)    RDW 18.3 (*)    All other components within normal limits  SARS CORONAVIRUS 2 (HOSPITAL ORDER, [place] [facility])  TROPONIN I (HIGH SENSITIVITY)  TROPONIN I (HIGH SENSITIVITY)   CRITICAL CARE Performed by: Laurence Aly   Total critical care time: 30 minutes  Critical care time was exclusive of separately billable procedures and treating other patients.  Critical care was necessary to treat or prevent imminent or  life-threatening deterioration.  Critical care was time spent personally by me on the following activities: development of treatment plan with patient and/or surrogate as well as nursing, discussions with consultants, evaluation of patient's response to treatment, examination of patient, obtaining history from patient or surrogate, ordering and performing treatments and interventions, ordering and review of laboratory studies, ordering and review of radiographic studies, pulse oximetry and re-evaluation of patient's condition.  RADIOLOGY Images were viewed by me  Chest x-ray IMPRESSION:  Cardiomegaly with mild, diffuse bilateral interstitial pulmonary  opacity, likely mild edema. No focal airspace opacity.  ____________________________________________   DIFFERENTIAL DIAGNOSIS   CHF, COPD, pneumonia, COVID-19  FINAL ASSESSMENT AND PLAN  COPD exacerbation, CHF   Plan: The patient had presented for increased shortness of breath and chest pain. Patient's labs did  reveal mild leukocytosis likely reflective of steroid use, otherwise unremarkable, COVID negative. Patient's imaging revealed cardiomegaly and some mild edema, no focal airspace opacity.  She was given duo nebs and steroids as well as IV Lasix.  Patient feels some better but not significantly better.  She would likely benefit from hospitalization, I will discuss with the hospitalist for admission.   Laurence Aly, MD    Note: This note was generated in part or whole with voice recognition software. Voice recognition is usually quite accurate but there are transcription errors that can and very often do occur. I apologize for any typographical errors that were not detected and corrected.     Earleen Newport, MD 07/06/19 [phone]

## 2019-07-07 ENCOUNTER — Other Ambulatory Visit: Payer: Self-pay

## 2019-07-07 ENCOUNTER — Inpatient Hospital Stay: Payer: Medicare HMO

## 2019-07-07 ENCOUNTER — Inpatient Hospital Stay
Admit: 2019-07-07 | Discharge: 2019-07-07 | Disposition: A | Discharge status: Home / Self Care | Payer: Medicare HMO | Attending: Internal Medicine | Admitting: Internal Medicine

## 2019-07-07 LAB — CBC
HCT: 48.1 % — ABNORMAL HIGH (ref 36.0–46.0)
Hemoglobin: 14.5 g/dL (ref 12.0–15.0)
MCH: 30.1 pg (ref 26.0–34.0)
MCHC: 30.1 g/dL (ref 30.0–36.0)
MCV: 100 fL (ref 80.0–100.0)
Platelets: 193 10*3/uL (ref 150–400)
RBC: 4.81 MIL/uL (ref 3.87–5.11)
RDW: 18.2 % — ABNORMAL HIGH (ref 11.5–15.5)
WBC: 9.7 10*3/uL (ref 4.0–10.5)
nRBC: 0 % (ref 0.0–0.2)

## 2019-07-07 LAB — ECHOCARDIOGRAM COMPLETE
Height: 62 in
Weight: 3859.2 oz

## 2019-07-07 LAB — BASIC METABOLIC PANEL
Anion gap: 8 (ref 5–15)
BUN: 23 mg/dL (ref 8–23)
CO2: 33 mmol/L — ABNORMAL HIGH (ref 22–32)
Calcium: 9.4 mg/dL (ref 8.9–10.3)
Chloride: 102 mmol/L (ref 98–111)
Creatinine, Ser: 0.71 mg/dL (ref 0.44–1.00)
GFR calc Af Amer: >60 mL/min (ref >60)
GFR calc non Af Amer: >60 mL/min (ref >60)
Glucose, Bld: 147 mg/dL — ABNORMAL HIGH (ref 70–99)
Potassium: 4.2 mmol/L (ref 3.5–5.1)
Sodium: 143 mmol/L (ref 135–145)

## 2019-07-07 LAB — TROPONIN I (HIGH SENSITIVITY): Troponin I (High Sensitivity): 6 ng/L (ref <18)

## 2019-07-07 MED ORDER — SODIUM CHLORIDE 0.9 % IV SOLN: INTRAVENOUS | Status: DC

## 2019-07-07 MED ORDER — ASPIRIN 81 MG PO CHEW
243.0000 mg | CHEWABLE_TABLET | Freq: Once | ORAL | Status: AC
  Administered 2019-07-07: 13:00:00 243 mg via ORAL
  Filled 2019-07-07: qty 3

## 2019-07-07 MED ORDER — MORPHINE SULFATE (PF) 4 MG/ML IV SOLN
4.0000 mg | INTRAVENOUS | Status: DC | PRN
  Administered 2019-07-07: 4 mg via INTRAVENOUS
  Administered 2019-07-11: 2 mg via INTRAVENOUS
  Administered 2019-07-12: 4 mg via INTRAVENOUS
  Filled 2019-07-07 (×3): qty 1

## 2019-07-07 MED ORDER — NITROGLYCERIN 0.4 MG SL SUBL
0.4000 mg | SUBLINGUAL_TABLET | SUBLINGUAL | Status: DC | PRN
  Administered 2019-07-07: 0.4 mg via SUBLINGUAL

## 2019-07-07 MED ORDER — IOHEXOL 350 MG/ML SOLN
75.0000 mL | Freq: Once | INTRAVENOUS | Status: AC | PRN
  Administered 2019-07-07: 75 mL via INTRAVENOUS

## 2019-07-07 MED ORDER — IPRATROPIUM-ALBUTEROL 0.5-2.5 (3) MG/3ML IN SOLN
3.0000 mL | Freq: Four times a day (QID) | RESPIRATORY_TRACT | Status: DC
  Administered 2019-07-07 – 2019-07-13 (×24): 3 mL via RESPIRATORY_TRACT
  Filled 2019-07-07 (×24): qty 3

## 2019-07-07 MED ORDER — COLCHICINE 0.6 MG PO TABS
0.6000 mg | ORAL_TABLET | Freq: Once | ORAL | Status: AC
  Administered 2019-07-07: 0.6 mg via ORAL
  Filled 2019-07-07: qty 1

## 2019-07-07 MED ORDER — ORAL CARE MOUTH RINSE
15.0000 mL | Freq: Two times a day (BID) | OROMUCOSAL | Status: DC
  Administered 2019-07-08 – 2019-07-12 (×3): 15 mL via OROMUCOSAL

## 2019-07-07 MED ORDER — CHLORHEXIDINE GLUCONATE 0.12 % MT SOLN
15.0000 mL | Freq: Two times a day (BID) | OROMUCOSAL | Status: DC
  Administered 2019-07-07 – 2019-07-11 (×8): 15 mL via OROMUCOSAL
  Filled 2019-07-07 (×10): qty 15

## 2019-07-07 NOTE — Progress Notes (Signed)
Patient reported worsening chest pain which patient described as "heavy pressure in sternum and back pain under left scapula".  Patient rated pain 7 out of 10.  Patient demonstrated increased work of breathing and denied N/V, skin was flushed and dry. Patient presented with audible wheezing and tachypnea.  NT preformed 12-LEAD EKG. Patient received nitroglycerin x 1 and reported chest pain 5 out of 10.  Patient's blood pressure dropped to 70s/40s further nitroglycerin held.  RN continued to monitor patient and blood pressure stabilized to 101/58.  Sp02 88% on 4L [place]. Increased to 5L, sp02 90%. MD notified of patients condition. Patients family member at bedside. Dr. Brett Albino at bedside and new orders received.

## 2019-07-07 NOTE — Consult Note (Signed)
Cardiology Consultation Note    Patient ID: Theresa [name], MRN: [id number], DOB/AGE: September 14, 1956 63 y.o. Admit date: 07/06/2019   Date of Consult: 07/07/2019 Primary Physician: Margo Common, PA Primary Cardiologist: Dr. Clayborn Bigness  Chief Complaint: chest pain/sob Reason for Consultation: chest pain Requesting MD: Dr. Brett Albino  HPI: Theresa [name] is a 63 y.o. female with history of was admitted after presenting with complaints of several days of chest pain.  She is ruled out for a myocardial infarction with serial high-sensitivity troponins of less than 10.  Electrocardiogram showed sinus rhythm with nonspecific ST-T wave changes on admission.  Electrocardiogram today showed no appreciable change.  Patient describes the pain as epigastric and occasionally on her left side under her left breast and into her left subscapular area.  It is positional somewhat and that it is somewhat worse when she lays on her back.  It is not pleuritic.  Pain is reproduced by palpation of her epigastric region and lower sternal region.  Pain in her back is not reproduced with deep palpation.  Chest x-ray showed cardiomegaly with mild diffuse bilateral interstitial pulmonary opacity.  Chest CT with and without contrast revealed no pulmonary embolism.  There is no significant pericardial effusion.  Patient had diffuse coronary calcifications which were present previously.  There was no pulmonary edema.  Patient states she had a cardiac catheterization in 2015 which showed no significant coronary disease.  She is actively wheezing and complains of some shortness of breath.  She is receiving inhaled bronchodilators.  Potassium is normal.  BUN and creatinine were normal.  CBC is normal as is her hemoglobin.  Echocardiogram done in February 2019 revealed ejection fraction of 55 to 60% with elevated RV systolic pressure of 58 mmHg.  She had diastolic dysfunction.  No high-grade valvular disease.  Echocardiogram during this  admission is still pending.  BNP in July of this year was 301.  1 year ago it was 333.  From this admission BNP is still pending.  She is COVID negative.  Patient continues to smoke despite severe oxygen dependent COPD.  Past Medical History:  Diagnosis Date  . Acute postoperative pain 10/03/2017  . Anxiety   . BiPAP (biphasic positive airway pressure) dependence    at hs  . Carpal tunnel syndrome   . CHF (congestive heart failure) ([place])    1/18  . CHF (congestive heart failure) ([place])   . Chronic generalized abdominal pain   . Chronic rhinitis   . COPD (chronic obstructive pulmonary disease) ([place])   . DDD (degenerative disc disease), cervical   . DDD (degenerative disc disease), lumbosacral   . Depression   . Dyspnea   . Edema   . Flu    1/18  . Gastritis   . GERD (gastroesophageal reflux disease)   . Hematuria   . Hernia of abdominal wall 07/17/2018  . Hernia, abdominal   . Hypertension   . Kidney stones   . Low back pain   . Lumbar radiculitis   . Malodorous urine   . Muscle weakness   . Obesity   . Oxygen dependent   . Renal cyst   . Sensory urge incontinence   . Thyroid activity decreased    9/19  . Tobacco abuse   . Wheezing       Surgical History:  Past Surgical History:  Procedure Laterality Date  . ABDOMINAL HYSTERECTOMY    . CARPAL TUNNEL RELEASE Left 2012  . EPIGASTRIC HERNIA REPAIR N/A 08/13/2018  HERNIA REPAIR EPIGASTRIC ADULT; polypropylene mesh reinforcement.  Surgeon: Robert Bellow, MD;  Location: [facility] ORS;  Service: General;  Laterality: N/A;  . TONSILLECTOMY    . TUBAL LIGATION    . UMBILICAL HERNIA REPAIR N/A 08/13/2018   HERNIA REPAIR UMBILICAL ADULT; polypropylene mesh reinforcement Surgeon: Robert Bellow, MD;  Location: [facility] ORS;  Service: General;  Laterality: N/A;     Home Meds: Prior to Admission medications   Medication Sig Start Date End Date Taking? Authorizing Provider  ALPRAZolam Duanne Moron) 0.5 MG tablet Take 1 tablet  (0.5 mg total) by mouth 2 (two) times daily as needed for anxiety. Patient taking differently: Take 0.5 mg by mouth daily as needed for anxiety.  12/09/16  Yes Darylene Price A, FNP  aspirin EC 81 MG tablet Take 81 mg by mouth daily.   Yes [provider]  bisoprolol-hydrochlorothiazide [facility]) 10-6.25 MG tablet TAKE 1 TABLET BY MOUTH DAILY 06/27/19  Yes Chrismon, Vickki Muff, PA  Bromfenac Sodium (PROLENSA) 0.07 % SOLN Place 1 drop into the left eye at bedtime.   Yes [provider]  Cholecalciferol ([employer] D3) 2000 units CAPS Take 2,000 Units by mouth daily.    Yes [provider]  doxycycline (VIBRA-TABS) 100 MG tablet Take 1 tablet (100 mg total) by mouth 2 (two) times daily for 7 days. 07/05/19 07/12/19 Yes Trinna Post, PA-C  Fluticasone-Umeclidin-Vilant 100-62.5-25 MCG/INH AEPB Inhale 1 puff into the lungs daily. 10/03/18  Yes [provider]  gabapentin (NEURONTIN) 300 MG capsule Take 1 capsule (300 mg total) by mouth 4 (four) times daily. 05/18/19 08/16/19 Yes Milinda Pointer, MD  HYDROcodone-acetaminophen (NORCO/VICODIN) 5-325 MG tablet Take 1 tablet by mouth every 8 (eight) hours as needed for severe pain. Must last 30 days 06/17/19 07/17/19 Yes Milinda Pointer, MD  ibuprofen (ADVIL,MOTRIN) 200 MG tablet Take 1,200 mg by mouth every 6 (six) hours as needed for headache or moderate pain.   Yes [provider]  ipratropium-albuterol (DUONEB) 0.5-2.5 (3) MG/3ML SOLN USE 1 VIAL VIA NEBULIZER EVERY 6 HOURS AS NEEDED FOR SHORTNESS OF BREATH OR WHEEZING Patient taking differently: Take 3 mLs by nebulization every 6 (six) hours as needed (shortness of breat / wheezing).  11/30/18  Yes Chrismon, Vickki Muff, PA  levothyroxine (SYNTHROID) 25 MCG tablet Take 1 tablet (25 mcg total) by mouth daily before breakfast. DUPLICATE ENTRY 04/14/46  Yes Chrismon, Vickki Muff, PA  potassium chloride SA (K-DUR,KLOR-CON) 20 MEQ tablet Take 1 tablet (20 mEq total) by mouth 2 (two)  times daily. 08/31/18  Yes Hackney, Otila Kluver A, FNP  predniSONE (DELTASONE) 20 MG tablet Take 2 tablets (40 mg total) by mouth daily with breakfast for 5 days. 07/05/19 07/10/19 Yes Pollak, Wendee Beavers, PA-C  sertraline (ZOLOFT) 100 MG tablet TAKE 2 TABLETS(200 MG) BY MOUTH EVERY MORNING Patient taking differently: Take 200 mg by mouth daily.  11/16/18  Yes Chrismon, Vickki Muff, PA  torsemide (DEMADEX) 20 MG tablet TAKE 2 TABLETS(40 MG) BY MOUTH TWICE DAILY Patient taking differently: Take 40 mg by mouth 2 (two) times daily.  05/28/19  Yes Hackney, Tina A, FNP  VENTOLIN HFA 108 (90 Base) MCG/ACT inhaler Inhale 2 puffs into the lungs every 6 (six) hours as needed for shortness of breath. 02/10/18  Yes Fritzi Mandes, MD  metolazone (ZAROXOLYN) 5 MG tablet Take 1 tablet (5 mg total) by mouth daily. Patient not taking: Reported on 07/05/2019 04/23/19 07/22/19  Chrismon, Vickki Muff, PA    Inpatient Medications:  . aspirin EC  81 mg Oral Daily  . azithromycin  500 mg Oral Daily  . bisoprolol-hydrochlorothiazide  1 tablet Oral Daily  . cholecalciferol  2,000 Units Oral Daily  . enoxaparin (LOVENOX) injection  40 mg Subcutaneous Q12H  . fluticasone furoate-vilanterol  1 puff Inhalation Daily   Or  . umeclidinium bromide  1 puff Inhalation Daily  . furosemide  60 mg Intravenous Q12H  . gabapentin  300 mg Oral QID  . ipratropium-albuterol  3 mL Nebulization Q6H  . levothyroxine  25 mcg Oral QAC breakfast  . lisinopril  2.5 mg Oral Daily  . methylPREDNISolone (SOLU-MEDROL) injection  60 mg Intravenous Q6H  . potassium chloride SA  20 mEq Oral BID  . sertraline  200 mg Oral Daily  . sodium chloride flush  3 mL Intravenous Once  . sodium chloride flush  3 mL Intravenous Q12H   . sodium chloride    . sodium chloride      Allergies:  Allergies  Allergen Reactions  . Codeine Nausea And Vomiting  . Meloxicam Other (See Comments)    Stomach pain    Social History   Socioeconomic History  . Marital status:  Married    Spouse name: Ovid Curd  . Number of children: 2  . Years of education: Not on file  . Highest education level: 12th grade  Occupational History  . Occupation: disability  Social Needs  . Financial resource strain: Somewhat hard  . Food insecurity    Worry: Never true    Inability: Never true  . Transportation needs    Medical: No    Non-medical: No  Tobacco Use  . Smoking status: Current Every Day Smoker    Packs/day: 1.00    Years: 44.00    Pack years: 44.00    Types: Cigarettes  . Smokeless tobacco: Never Used  Substance and Sexual Activity  . Alcohol use: No    Frequency: Never  . Drug use: No  . Sexual activity: Not Currently    Birth control/protection: Surgical  Lifestyle  . Physical activity    Days per week: 0 days    Minutes per session: 0 min  . Stress: Rather much  Relationships  . Social Herbalist on phone: Patient refused    Gets together: Patient refused    Attends religious service: Patient refused    Active member of club or organization: Patient refused    Attends meetings of clubs or organizations: Patient refused    Relationship status: Patient refused  . Intimate partner violence    Fear of current or ex partner: Patient refused    Emotionally abused: Patient refused    Physically abused: Patient refused    Forced sexual activity: Patient refused  Other Topics Concern  . Not on file  Social History Narrative   ** Merged History Encounter **       Lives with spouse      Family History  Problem Relation Age of Onset  . Heart disease Mother   . Stroke Mother   . Coronary artery disease Mother   . Lung cancer Sister   . Cancer Sister   . Breast cancer Sister   . Alcohol abuse Father   . Heart disease Father   . Cancer Brother   . Cancer Brother   . Pneumonia Brother   . Prostate cancer Neg Hx   . Kidney cancer Neg Hx   . Bladder Cancer Neg Hx      Review of  Systems: A 12-system review of systems was performed  and is negative except as noted in the HPI.  Labs: No results for input(s): CKTOTAL, CKMB, TROPONINI in the last 72 hours. Lab Results  Component Value Date   WBC 9.7 07/07/2019   HGB 14.5 07/07/2019   HCT 48.1 (H) 07/07/2019   MCV 100.0 07/07/2019   PLT 193 07/07/2019    Recent Labs  Lab 07/07/19 0431  NA 143  K 4.2  CL 102  CO2 33*  BUN 23  CREATININE 0.71  CALCIUM 9.4  GLUCOSE 147*   No results found for: CHOL, HDL, LDLCALC, TRIG No results found for: DDIMER  Radiology/Studies:  Dg Chest 1 View  Result Date: 07/06/2019 CLINICAL DATA:  Shortness of breath, chest pain EXAM: CHEST  1 VIEW COMPARISON:  02/07/2018 FINDINGS: Cardiomegaly. Mild, diffuse bilateral interstitial pulmonary opacity. The visualized skeletal structures are unremarkable. IMPRESSION: Cardiomegaly with mild, diffuse bilateral interstitial pulmonary opacity, likely mild edema. No focal airspace opacity. Electronically Signed   By: Eddie Candle M.D.   On: 07/06/2019 15:15   Ct Angio Chest Pe W Or Wo Contrast  Result Date: 07/07/2019 CLINICAL DATA:  Chest pain and worsening shortness of breath for 2 days. History of wheezing, hypertension, COPD, CHF, smoker. EXAM: CT ANGIOGRAPHY CHEST WITH CONTRAST TECHNIQUE: Multidetector CT imaging of the chest was performed using the standard protocol during bolus administration of intravenous contrast. Multiplanar CT image reconstructions and MIPs were obtained to evaluate the vascular anatomy. CONTRAST:  39m OMNIPAQUE IOHEXOL 350 MG/ML SOLN COMPARISON:  Chest CT dated 03/25/2019. FINDINGS: Cardiovascular: Some of the peripheral segmental and subsegmental pulmonary artery branches are difficult to definitively characterize due to patient breathing motion artifact, however, there is no pulmonary embolism identified within the main, lobar or segmental pulmonary arteries bilaterally. No thoracic aortic aneurysm or evidence of aortic dissection. Mild aortic atherosclerosis.  Cardiomegaly. No pericardial effusion. Diffuse coronary artery calcifications, particularly dense within the LEFT anterior descending coronary artery. Mediastinum/Nodes: No mass or enlarged lymph nodes seen within the mediastinum or perihilar regions. Esophagus appears normal. Trachea appears normal. Lungs/Pleura: Mild bibasilar atelectasis/scarring. Mild centrilobular and paraseptal emphysema. Stable linear consolidations within the medial aspects of each lung suggesting chronic MAI. No evidence of acute pneumonia or pulmonary edema. Upper Abdomen: No acute findings on limited images of the upper abdomen. Previously described cysts within the liver, again too small to definitively characterize. Musculoskeletal: No acute or suspicious osseous finding. Mild degenerative spurring within the thoracic spine. Review of the MIP images confirms the above findings. IMPRESSION: 1. No pulmonary embolism seen, with mild study limitations detailed above. 2. Cardiomegaly. No pericardial effusion. 3. Diffuse coronary artery calcifications, particularly dense within the LEFT anterior descending coronary artery. Recommend correlation with any possible associated cardiac symptoms. 4. Chronic/incidental lung findings detailed above. No evidence of active pneumonia or pulmonary edema. Aortic Atherosclerosis (ICD10-I70.0) and Emphysema (ICD10-J43.9). Electronically Signed   By: SFranki CabotM.D.   On: 07/07/2019 11:20    Wt Readings from Last 3 Encounters:  07/07/19 109.4 kg  07/05/19 108.9 kg  02/10/18 104.8 kg    EKG: Sinus rhythm with no ischemia  Physical Exam:  Blood pressure (!) 101/58, pulse 73, temperature 97.9 F (36.6 C), temperature source Oral, resp. rate (!) 26, height _0  (1.575 m), weight 109.4 kg, SpO2 (!) 89 %. Body mass index is 44.12 kg/m. General: Well developed, well nourished, in no acute distress. Head: Normocephalic, atraumatic, sclera non-icteric, no xanthomas, nares are without  discharge.  Neck: Negative for carotid bruits. JVD not elevated. Lungs: Clear bilaterally to auscultation without wheezes, rales, or rhonchi. Breathing is unlabored. Heart: RRR with S1 S2. No murmurs, rubs, or gallops appreciated. Abdomen: Soft, non-tender, non-distended with normoactive bowel sounds. No hepatomegaly. No rebound/guarding. No obvious abdominal masses. Msk:  Strength and tone appear normal for age. Extremities: No clubbing or cyanosis. No edema.  Distal pedal pulses are 2+ and equal bilaterally. Neuro: Alert and oriented X 3. No facial asymmetry. No focal deficit. Moves all extremities spontaneously. Psych:  Responds to questions appropriately with a normal affect.     Assessment and Plan  62 year old female with history of oxygen dependent COPD with active wheezing admitted with shortness of breath and chest pain.  She is ruled out for myocardial infarction with serial high-sensitivity troponins less than 10.  Electrocardiogram shows no active ischemia.  Pain has both typical atypical features.  She had unremarkable cardiac catheterization 5 years ago.  She is currently on IV steroids as well as inhaled bronchodilators.  She is also on lisinopril 2.5 mg daily, IV Lasix, bisoprolol/hydrochlorothiazide and aspirin.  Chest CT showed no pulmonary embolus.  Heavily calcified coronary arteries which is been present in previous CTs in the past.  Echo is pending.  Patient is not a candidate for noninvasive evaluation at present given her active wheezing.  Would consider noninvasive evaluation if wheezing can be mitigated.  Will review echo when available.  Does not appear to be a candidate for IV heparin at present.  Would continue with aggressive bronchodilators.  She does not appear to be having active ongoing ischemia.  Pain is worse with deep palpation.  Signed, Teodoro Spray MD 07/07/2019, 2:10 PM Pager: [phone]

## 2019-07-07 NOTE — Progress Notes (Addendum)
[facility] [place] NAME: Theresa [name]    MR#:  [id number]  DATE OF BIRTH:  1956-04-17  SUBJECTIVE:   Patient states she is not feeling much better this morning.  Her shortness of breath is slightly worse.  She is endorsing "left lung pain", which seems to be located in her left upper back.  She states that she becomes short of breath whenever she tries to move around.  This is not usual for her.  She is having cough productive of thick yellow sputum.  She also endorses some mild lower extremity edema.  No recent surgeries, long car trips, or long plane rides.  REVIEW OF SYSTEMS:  Review of Systems  Constitutional: Negative for chills and fever.  HENT: Negative for congestion and sore throat.   Eyes: Negative for blurred vision and double vision.  Respiratory: Positive for cough, sputum production and shortness of breath.   Cardiovascular: Positive for chest pain and leg swelling.  Gastrointestinal: Negative for nausea and vomiting.  Genitourinary: Negative for dysuria and urgency.  Musculoskeletal: Positive for back pain. Negative for neck pain.  Neurological: Negative for dizziness and headaches.  Psychiatric/Behavioral: Negative for depression. The patient is not nervous/anxious.     DRUG ALLERGIES:   Allergies  Allergen Reactions  . Codeine Nausea And Vomiting  . Meloxicam Other (See Comments)    Stomach pain   VITALS:  Blood pressure 115/60, pulse 72, temperature 97.9 F (36.6 C), temperature source Oral, resp. rate (!) 21, height _0  (1.575 m), weight 109.4 kg, SpO2 92 %. PHYSICAL EXAMINATION:  Physical Exam  GENERAL:  Laying in the bed with no acute distress.  HEENT: Head atraumatic, normocephalic. Pupils equal, round, reactive to light and accommodation. No scleral icterus. Extraocular muscles intact. Oropharynx and nasopharynx clear.  NECK:  Supple, no jugular venous distention. No thyroid enlargement. LUNGS: + Diffuse  inspiratory and expiratory wheezing auscultated throughout all lung fields.  No use of accessory muscles of respiration.  Nasal cannula in place. CARDIOVASCULAR: RRR, S1, S2 normal. No murmurs, rubs, or gallops.  ABDOMEN: Soft, nontender, nondistended. Bowel sounds present.  EXTREMITIES: No cyanosis, or clubbing.  1+ pitting edema in the lower extremities bilaterally. NEUROLOGIC: CN 2-12 intact, no focal deficits. 5/5 muscle strength throughout all extremities. Sensation intact throughout. Gait not checked.  PSYCHIATRIC: The patient is alert and oriented x 3.  SKIN: No obvious rash, lesion, or ulcer.   LABORATORY PANEL:  Female CBC Recent Labs  Lab 07/07/19 0431  WBC 9.7  HGB 14.5  HCT 48.1*  PLT 193   ------------------------------------------------------------------------------------------------------------------ Chemistries  Recent Labs  Lab 07/06/19 1452 07/07/19 0431  NA 143 143  K 3.6 4.2  CL 104 102  CO2 28 33*  GLUCOSE 154* 147*  BUN 19 23  CREATININE 0.79 0.71  CALCIUM 9.2 9.4  MG 2.1  --    RADIOLOGY:  Dg Chest 1 View  Result Date: 07/06/2019 CLINICAL DATA:  Shortness of breath, chest pain EXAM: CHEST  1 VIEW COMPARISON:  02/07/2018 FINDINGS: Cardiomegaly. Mild, diffuse bilateral interstitial pulmonary opacity. The visualized skeletal structures are unremarkable. IMPRESSION: Cardiomegaly with mild, diffuse bilateral interstitial pulmonary opacity, likely mild edema. No focal airspace opacity. Electronically Signed   By: Eddie Candle M.D.   On: 07/06/2019 15:15   ASSESSMENT AND PLAN:   Acute on chronic hypoxic respiratory failure secondary to COPD exacerbation and CHF exacerbation.  Patient has shown no improvement overnight, and continues to have shortness  of breath and left upper back pain.  She had to be increased from 3 L to 4 L (baseline O2 requirement is 2 L). -Will obtain CTA chest to rule out PE -High-sensitivity troponin negative x 2 -Wean O2 back to home  2 L O2  Acute exacerbation of COPD-patient continues having diffuse inspiratory and expiratory wheezing -Continue IV Solu-Medrol, azithromycin, and scheduled duonebs -Continue home inhalers  Acute on chronic diastolic congestive heart failure- last ECHO with EF 55-60% and grade 1 diastolic dysfunction. -Continue IV Lasix -Check ECHO -Daily weights, strict I/O -Continue  Hypertension- BP well-controlled -Continue lisinopril  Hypothyroidism- stable -Continue home Synthroid  Depression- stable -Continue home Zoloft  All the records are reviewed and case discussed with Care Management/Social Worker. Management plans discussed with the patient, family and they are in agreement.  CODE STATUS: Full Code  TOTAL TIME TAKING CARE OF THIS PATIENT: 40 minutes.   More than 50% of the time was spent in counseling/coordination of care: YES  POSSIBLE D/C IN 1-2 DAYS, DEPENDING ON CLINICAL CONDITION.   Berna Spare Xariah Silvernail M.D on 07/07/2019 at 10:18 AM  Between 7am to 6pm - Pager - [phone]  After 6pm go to [email or web address] - Proofreader  [facility]  Office  [phone]  CC: Primary care physician; Margo Common, PA  Note: This dictation was prepared with Dragon dictation along with smaller phrase technology. Any transcriptional errors that result from this process are unintentional.

## 2019-07-07 NOTE — Progress Notes (Signed)
Called by RN due to worsening chest pain and respiratory distress. Patient is complaining of left sided chest pain and tightness. She received nitroglycerin x 1, which helped her chest pain. After receiving the nitro, her BP dropped to the 70s/40s. Her BP subsequently improved to 101/70 most recently. I have ordered a repeat troponin. CTA chest done this morning was negative for PE, but did show diffuse coronary artery calcifications particularly in the LAD. I discussed with Dr. Ubaldo Glassing, who will come evaluate. If her troponin is elevated, we will start her on a heparin drip.  Hyman Bible, MD

## 2019-07-07 NOTE — Progress Notes (Signed)
See progress note

## 2019-07-07 NOTE — TOC Initial Note (Signed)
Transition of Care [facility]) - Initial/Assessment Note    Patient Details  Name: Theresa [name] MRN: [id number] Date of Birth: 09-18-1956  Transition of Care [facility]) CM/SW Contact:    Katrina Stack, RN Phone Number: 07/07/2019, 3:55 PM  Clinical Narrative:               Patient known to this CM.  Consult for heart failure assessment.  has access to scales. Denies issues accessing medical care, transportation or with obtaining medications. She ha snot allowed home health to visit in the past - agrees then when agency attempts to visit, she declines.  Have also made [facility] referrals on patient's behalf. Chronic home oxygen with [employer].  She does request a new home nebulizer as her is years and years old and on it's last leg.  Requested order from attending   Expected Discharge Plan: Home/Self Care     Patient Goals and CMS Choice   CMS Medicare.gov Compare Post Acute Care list provided to:: (NA AT PRESENT)    Expected Discharge Plan and Services Expected Discharge Plan: Home/Self Care       Living arrangements for the past 2 months: Single Family Home                                      Prior Living Arrangements/Services Living arrangements for the past 2 months: Single Family Home Lives with:: Spouse Patient language and need for interpreter reviewed:: Yes        Need for Family Participation in Patient Care: No (Comment) Care giver support system in place?: Yes (comment) Current home services: DME([facility]- oxygen.) Criminal Activity/Legal Involvement Pertinent to Current Situation/Hospitalization: No - Comment as needed  Activities of Daily Living Home Assistive Devices/Equipment: None, BIPAP ADL Screening (condition at time of admission) Patient's cognitive ability adequate to safely complete daily activities?: Yes Is the patient deaf or have difficulty hearing?: No Does the patient have difficulty seeing, even when wearing glasses/contacts?: No Does the patient have  difficulty concentrating, remembering, or making decisions?: No Patient able to express need for assistance with ADLs?: No Does the patient have difficulty dressing or bathing?: No Independently performs ADLs?: Yes (appropriate for developmental age) Does the patient have difficulty walking or climbing stairs?: No Weakness of Legs: None Weakness of Arms/Hands: None  Permission Sought/Granted Permission sought to share information with : Case Manager Permission granted to share information with : Yes, Verbal Permission Granted     Permission granted to share info w AGENCY: [facility]        Emotional Assessment Appearance:: Appears older than stated age Attitude/Demeanor/Rapport: Engaged Affect (typically observed): Calm Orientation: : Oriented to Self, Oriented to Place, Oriented to  Time, Oriented to Situation Alcohol / Substance Use: Not Applicable, Tobacco Use Psych Involvement: No (comment)  Admission diagnosis:  Atypical chest pain [R07.89] COPD exacerbation (HCC) [J44.1] Congestive heart failure, unspecified HF chronicity, unspecified heart failure type ([place]) [I50.9] Patient Active Problem List   Diagnosis Date Noted  . Acute on chronic respiratory failure with hypoxia ([place]) 07/06/2019  . Cervicalgia (Bilateral) (R>L) 09/24/2018  . Spondylosis without myelopathy or radiculopathy, lumbosacral region 09/24/2018  . Epigastric hernia 05/04/2018  . Chronic lower extremity pain (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) 01/25/2018  . Altered mental status, unspecified 12/09/2017  . Chronic hip pain [facility]) (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) 11/27/2017  . COPD (chronic obstructive pulmonary disease) ([place]) 08/11/2017  . Other long term (current)  drug therapy 08/08/2017  . Disorder of bone, unspecified 08/08/2017  . COPD with hypoxia ([place]) 06/27/2017  . Arthralgia of acromioclavicular joint (Right) 06/15/2017  . Acromioclavicular joint DJD (Right) 06/15/2017  . 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   . Chronic shoulder pain (Left) 05/11/2017  . Elevated brain natriuretic peptide (BNP) level 05/11/2017  . Grief at loss of child 02/14/2017  . Lumbar facet hypertrophy (multilevel) 11/17/2016  . Lumbar spinal stenosis (L4-5) 11/17/2016  . 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) 11/17/2016  . 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 Lumbosacral radiculopathy at L4 11/17/2016  . Supplemental oxygen dependent 08/01/2016  . Sleep apnea 06/02/2016  . Long term current use of opiate analgesic 01/12/2016  . Long term prescription opiate use 01/12/2016  . Opiate use (15 MME/Day) 01/12/2016  . Lumbar facet syndrome (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) 01/12/2016  . Chronic sacroiliac joint pain (Bilateral) 01/12/2016  . Vitamin D deficiency 12/30/2015  . 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 Obesity, Class III, BMI 40-49.9 (morbid obesity) ([place]) 12/28/2015  . 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[place]) 11/19/2015  . Coagulation disorder ([place]) 10/22/2015  . Chronic pain syndrome (significant psychosocial component) 09/21/2015  . 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 09/21/2015  . Encounter for therapeutic drug level monitoring 09/16/2015  . Encounter for chronic pain management 09/16/2015  . Pain management 09/16/2015  . Neurogenic pain 09/16/2015  . Neuropathic pain 09/16/2015  . Musculoskeletal pain 09/16/2015  . Lumbar spondylosis (L4-5) 09/16/2015  . Chronic low back pain (Primary Area of Pain) (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) 09/16/2015  . Chronic lower extremity pain (Secondary Area of Pain) (Left) 09/16/2015  . Chronic lumbar radicular pain (L4 & S1 Dermatome) (Left) 09/16/2015  . Chronic shoulder pain (Right side) 09/16/2015  . Chronic upper extremity pain (Right-sided) 09/16/2015  . Cervical radiculitis (Right side) 09/16/2015  . Abnormal x-ray of lumbar spine 09/16/2015  . Chronic diastolic heart failure ([place]) 07/15/2015  . Chest  pain 07/15/2015  . Tobacco abuse 07/15/2015  . Blood clotting disorder ([place]) 04/21/2015  . Decreased motor strength 07/16/2014  . Clinical depression 07/16/2014  . Chronic rhinitis 07/16/2014  . Carpal tunnel syndrome 07/16/2014  . Anxiety state 07/16/2014  . Neuritis or radiculitis due to rupture of lumbar intervertebral disc 07/02/2014  . DDD (degenerative disc disease), lumbar 07/02/2014  . CAFL (chronic airflow limitation) ([place]) 02/27/2014  . Essential (primary) hypertension 03/27/2008   PCP:  Margo Common, PA Pharmacy:   [facility] [phone], [address] [place] [address] Phone: [phone] Fax: [phone]     Social Determinants of Health (SDOH) Interventions    Readmission Risk Interventions No flowsheet data found.

## 2019-07-07 NOTE — Progress Notes (Signed)
ReDs Clip Reading: 36%

## 2019-07-07 NOTE — Plan of Care (Signed)
  Problem: Education: Goal: Knowledge of General Education information will improve Description: Including pain rating scale, medication(s)/side effects and non-pharmacologic comfort measures Outcome: Progressing   Problem: Pain Managment: Goal: General experience of comfort will improve Outcome: Progressing   Problem: Safety: Goal: Ability to remain free from injury will improve Outcome: Progressing

## 2019-07-07 NOTE — Progress Notes (Signed)
Patients BP 89/52. MD notified and will hold lasix and any BP medications.

## 2019-07-08 ENCOUNTER — Encounter: Payer: Self-pay | Admitting: *Deleted

## 2019-07-08 LAB — CBC
HCT: 48.1 % — ABNORMAL HIGH (ref 36.0–46.0)
Hemoglobin: 14.4 g/dL (ref 12.0–15.0)
MCH: 29.9 pg (ref 26.0–34.0)
MCHC: 29.9 g/dL — ABNORMAL LOW (ref 30.0–36.0)
MCV: 100 fL (ref 80.0–100.0)
Platelets: 201 10*3/uL (ref 150–400)
RBC: 4.81 MIL/uL (ref 3.87–5.11)
RDW: 18.4 % — ABNORMAL HIGH (ref 11.5–15.5)
WBC: 14.3 10*3/uL — ABNORMAL HIGH (ref 4.0–10.5)
nRBC: 0 % (ref 0.0–0.2)

## 2019-07-08 LAB — BASIC METABOLIC PANEL
Anion gap: 9 (ref 5–15)
BUN: 36 mg/dL — ABNORMAL HIGH (ref 8–23)
CO2: 33 mmol/L — ABNORMAL HIGH (ref 22–32)
Calcium: 9.5 mg/dL (ref 8.9–10.3)
Chloride: 96 mmol/L — ABNORMAL LOW (ref 98–111)
Creatinine, Ser: 0.86 mg/dL (ref 0.44–1.00)
GFR calc Af Amer: >60 mL/min (ref >60)
GFR calc non Af Amer: >60 mL/min (ref >60)
Glucose, Bld: 161 mg/dL — ABNORMAL HIGH (ref 70–99)
Potassium: 4.7 mmol/L (ref 3.5–5.1)
Sodium: 138 mmol/L (ref 135–145)

## 2019-07-08 MED ORDER — CYCLOBENZAPRINE HCL 10 MG PO TABS
5.0000 mg | ORAL_TABLET | Freq: Two times a day (BID) | ORAL | Status: DC
  Administered 2019-07-08 – 2019-07-10 (×5): 5 mg via ORAL
  Filled 2019-07-08 (×5): qty 1

## 2019-07-08 MED ORDER — HYDROCODONE-ACETAMINOPHEN 5-325 MG PO TABS
1.0000 | ORAL_TABLET | Freq: Three times a day (TID) | ORAL | Status: DC | PRN
  Administered 2019-07-09 – 2019-07-12 (×3): 1 via ORAL
  Filled 2019-07-08 (×3): qty 1

## 2019-07-08 MED ORDER — FUROSEMIDE 10 MG/ML IJ SOLN
20.0000 mg | Freq: Three times a day (TID) | INTRAMUSCULAR | Status: DC
  Administered 2019-07-08 – 2019-07-13 (×14): 20 mg via INTRAVENOUS
  Filled 2019-07-08 (×15): qty 2

## 2019-07-08 NOTE — TOC Progression Note (Signed)
Transition of Care [facility]) - Progression Note    Patient Details  Name: Theresa [name] MRN: [id number] Date of Birth: 1956/03/27  Transition of Care [facility]) CM/SW Contact  Elza Rafter, RN Phone Number: 07/08/2019, 2:44 PM  Clinical Narrative:   Leroy Sea with [employer] is aware of need for nebulizer machine prior to discharge.      Expected Discharge Plan: Home/Self Care    Expected Discharge Plan and Services Expected Discharge Plan: Home/Self Care       Living arrangements for the past 2 months: Single Family Home                                       Social Determinants of Health (SDOH) Interventions    Readmission Risk Interventions No flowsheet data found.

## 2019-07-08 NOTE — Plan of Care (Signed)
  Problem: Education: Goal: Knowledge of General Education information will improve Description: Including pain rating scale, medication(s)/side effects and non-pharmacologic comfort measures Outcome: Progressing   Problem: Clinical Measurements: Goal: Respiratory complications will improve Outcome: Not Progressing Note: Pt still requiring 5L of oxygen and wheezing.  Goal: Cardiovascular complication will be avoided Outcome: Not Progressing Note: Patient's blood pressure still low at one point this shift. Has gotten better.BP meds on hold.    Problem: Clinical Measurements: Goal: Cardiovascular complication will be avoided Outcome: Not Progressing Note: Patient's blood pressure still low at one point this shift. Has gotten better.BP meds on hold.

## 2019-07-08 NOTE — Plan of Care (Signed)
  Problem: Education: Goal: Knowledge of General Education information will improve Description: Including pain rating scale, medication(s)/side effects and non-pharmacologic comfort measures Outcome: Progressing   Problem: Clinical Measurements: Goal: Respiratory complications will improve Outcome: Progressing   Problem: Pain Managment: Goal: General experience of comfort will improve Outcome: Progressing   Problem: Safety: Goal: Ability to remain free from injury will improve Outcome: Progressing   Problem: Education: Goal: Knowledge of General Education information will improve Description: Including pain rating scale, medication(s)/side effects and non-pharmacologic comfort measures Outcome: Progressing   Problem: Clinical Measurements: Goal: Respiratory complications will improve Outcome: Progressing   Problem: Pain Managment: Goal: General experience of comfort will improve Outcome: Progressing   Problem: Safety: Goal: Ability to remain free from injury will improve Outcome: Progressing

## 2019-07-08 NOTE — Progress Notes (Signed)
Pt BP was 102/57 HR 59 and schedule to have lasix 60 mg IV. Notify prime and talked to Dr. Marcille Blanco and ordered to hold Lasix at this time. Will continue to monitor.

## 2019-07-08 NOTE — Progress Notes (Addendum)
Patient complaining of shortness of breath and chest pain. Upon assessment patient's oxygen was not on. Oxygen was put on and vitals were checked. O2 95% on 5 L and blood pressure 88/53 and 78/39. Dr. Tressia Miners paged and made aware. New orders were put in by MD.   Update 1700: Patient resting in bed without any complaints.Blood pressure better at 103/59, 95% on 5L of oxygen.

## 2019-07-08 NOTE — Progress Notes (Signed)
[place] [facility] [place] NAME: Theresa [name]    MR#:  [id number]  DATE OF BIRTH:  [birth year]  SUBJECTIVE:  CHIEF COMPLAINT:   Chief Complaint  Patient presents with   Chest Pain   Shortness of Breath   -Admitted with COPD and CHF exacerbation.  On 5 L oxygen which is increased from her home O2 requirements. -Still continues to wheeze and also complains of abdominal pain and upper back pain  REVIEW OF SYSTEMS:  Review of Systems  Constitutional: Positive for malaise/fatigue. Negative for chills and fever.  HENT: Negative for congestion, ear discharge, hearing loss and nosebleeds.   Respiratory: Positive for shortness of breath and wheezing. Negative for cough.   Cardiovascular: Positive for orthopnea and leg swelling. Negative for chest pain and palpitations.  Gastrointestinal: Negative for abdominal pain, constipation, diarrhea, nausea and vomiting.  Genitourinary: Negative for dysuria.  Musculoskeletal: Positive for back pain and myalgias.  Neurological: Negative for dizziness, focal weakness, seizures, weakness and headaches.  Psychiatric/Behavioral: Negative for depression.    DRUG ALLERGIES:   Allergies  Allergen Reactions   Codeine Nausea And Vomiting   Meloxicam Other (See Comments)    Stomach pain    VITALS:  Blood pressure 104/61, pulse 67, temperature 98 F (36.7 C), temperature source Oral, resp. rate 20, height _0  (1.575 m), weight 109 kg, SpO2 92 %.  PHYSICAL EXAMINATION:  Physical Exam   GENERAL:  63 y.o.-year-old obese patient lying in the bed and appears to be in mild respiratory distress.  EYES: Pupils equal, round, reactive to light and accommodation. No scleral icterus. Extraocular muscles intact.  HEENT: Head atraumatic, normocephalic. Oropharynx and nasopharynx clear.  NECK:  Supple, no jugular venous distention. No thyroid enlargement, no tenderness.  LUNGS: Diffuse expiratory wheezing all over the  lung fields, no crackles or rales.  Using accessory muscles to breathe on exertion.Marland Kitchen  CARDIOVASCULAR: S1, S2 normal. No  rubs, or gallops.  2/6 systolic murmur is present ABDOMEN: Soft, nontender, nondistended. Bowel sounds present. No organomegaly or mass.  EXTREMITIES: No pedal edema, cyanosis, or clubbing.  NEUROLOGIC: Cranial nerves II through XII are intact. Muscle strength 5/5 in all extremities. Sensation intact. Gait not checked.  Global weakness noted PSYCHIATRIC: The patient is alert and oriented x 3.  SKIN: No obvious rash, lesion, or ulcer.    LABORATORY PANEL:   CBC Recent Labs  Lab 07/08/19 0402  WBC 14.3*  HGB 14.4  HCT 48.1*  PLT 201   ------------------------------------------------------------------------------------------------------------------  Chemistries  Recent Labs  Lab 07/06/19 1452  07/08/19 0402  NA 143   < > 138  K 3.6   < > 4.7  CL 104   < > 96*  CO2 28   < > 33*  GLUCOSE 154*   < > 161*  BUN 19   < > 36*  CREATININE 0.79   < > 0.86  CALCIUM 9.2   < > 9.5  MG 2.1  --   --    < > = values in this interval not displayed.   ------------------------------------------------------------------------------------------------------------------  Cardiac Enzymes No results for input(s): TROPONINI in the last 168 hours. ------------------------------------------------------------------------------------------------------------------  RADIOLOGY:  Dg Chest 1 View  Result Date: 07/06/2019 CLINICAL DATA:  Shortness of breath, chest pain EXAM: CHEST  1 VIEW COMPARISON:  02/07/2018 FINDINGS: Cardiomegaly. Mild, diffuse bilateral interstitial pulmonary opacity. The visualized skeletal structures are unremarkable. IMPRESSION: Cardiomegaly with mild, diffuse bilateral interstitial pulmonary opacity, likely mild edema. No  focal airspace opacity. Electronically Signed   By: Eddie Candle M.D.   On: 07/06/2019 15:15   Ct Angio Chest Pe W Or Wo Contrast  Result  Date: 07/07/2019 CLINICAL DATA:  Chest pain and worsening shortness of breath for 2 days. History of wheezing, hypertension, COPD, CHF, smoker. EXAM: CT ANGIOGRAPHY CHEST WITH CONTRAST TECHNIQUE: Multidetector CT imaging of the chest was performed using the standard protocol during bolus administration of intravenous contrast. Multiplanar CT image reconstructions and MIPs were obtained to evaluate the vascular anatomy. CONTRAST:  62m OMNIPAQUE IOHEXOL 350 MG/ML SOLN COMPARISON:  Chest CT dated 03/25/2019. FINDINGS: Cardiovascular: Some of the peripheral segmental and subsegmental pulmonary artery branches are difficult to definitively characterize due to patient breathing motion artifact, however, there is no pulmonary embolism identified within the main, lobar or segmental pulmonary arteries bilaterally. No thoracic aortic aneurysm or evidence of aortic dissection. Mild aortic atherosclerosis. Cardiomegaly. No pericardial effusion. Diffuse coronary artery calcifications, particularly dense within the LEFT anterior descending coronary artery. Mediastinum/Nodes: No mass or enlarged lymph nodes seen within the mediastinum or perihilar regions. Esophagus appears normal. Trachea appears normal. Lungs/Pleura: Mild bibasilar atelectasis/scarring. Mild centrilobular and paraseptal emphysema. Stable linear consolidations within the medial aspects of each lung suggesting chronic MAI. No evidence of acute pneumonia or pulmonary edema. Upper Abdomen: No acute findings on limited images of the upper abdomen. Previously described cysts within the liver, again too small to definitively characterize. Musculoskeletal: No acute or suspicious osseous finding. Mild degenerative spurring within the thoracic spine. Review of the MIP images confirms the above findings. IMPRESSION: 1. No pulmonary embolism seen, with mild study limitations detailed above. 2. Cardiomegaly. No pericardial effusion. 3. Diffuse coronary artery  calcifications, particularly dense within the LEFT anterior descending coronary artery. Recommend correlation with any possible associated cardiac symptoms. 4. Chronic/incidental lung findings detailed above. No evidence of active pneumonia or pulmonary edema. Aortic Atherosclerosis (ICD10-I70.0) and Emphysema (ICD10-J43.9). Electronically Signed   By: SFranki CabotM.D.   On: 07/07/2019 11:20    EKG:   Orders placed or performed during the hospital encounter of 07/06/19   EKG 12-Lead   EKG 12-Lead   ED EKG   ED EKG   EKG 12-Lead   EKG 12-Lead   EKG - 12 lead   EKG - 12 lead    ASSESSMENT AND PLAN:   63y/o Female with PMH significant for chronic respiratory failure secondary to COPD on 2.5 L home oxygen, sleep apnea on CPAP, obesity, GERD, low back pain, hypertension, diastolic CHF presents to hospital secondary to worsening shortness of breath for almost 1 week.  1.  Acute on chronic respiratory failure-secondary to COPD and CHF exacerbation -Currently on 5 L oxygen here. -Appreciate cardiology consult. -Continue IV Lasix, strict input and output monitoring -Continue IV steroids, inhalers and nebulizers -On oral Zithromax -CT angiogram negative for pulmonary embolism  2.  Acute on chronic diastolic heart failure-echo with EF of 60 to 65%. -Appreciate cardiology consult.  Continue IV Lasix.  Echo with moderately elevated pulmonary artery pressures. -Continue cardiac medications.  Patient done lisinopril and bisoprolol.  3.  Depression-on Zoloft  4.  Hypothyroidism-Synthroid-  5.  DVT prophylaxis-Lovenox   Independent at baseline, lives at home with husband   All the records are reviewed and case discussed with Care Management/Social Workerr. Management plans discussed with the patient, family and they are in agreement.  CODE STATUS: Full code  TOTAL TIME TAKING CARE OF THIS PATIENT: 38 minutes.   POSSIBLE D/C IN  2-3 DAYS, DEPENDING ON CLINICAL  CONDITION.   Gladstone Lighter M.D on 07/08/2019 at 12:07 PM  Between 7am to 6pm - Pager - [phone]  After 6pm go to [email or web address] - password [email or web address] [place] [facility]  Office  [phone]  CC: Primary care physician; Chrismon, Vickki Muff, PA

## 2019-07-09 LAB — BRAIN NATRIURETIC PEPTIDE: B Natriuretic Peptide: 108 pg/mL — ABNORMAL HIGH (ref 0.0–100.0)

## 2019-07-09 LAB — BASIC METABOLIC PANEL
Anion gap: 8 (ref 5–15)
BUN: 33 mg/dL — ABNORMAL HIGH (ref 8–23)
CO2: 33 mmol/L — ABNORMAL HIGH (ref 22–32)
Calcium: 8.8 mg/dL — ABNORMAL LOW (ref 8.9–10.3)
Chloride: 100 mmol/L (ref 98–111)
Creatinine, Ser: 0.79 mg/dL (ref 0.44–1.00)
GFR calc Af Amer: >60 mL/min (ref >60)
GFR calc non Af Amer: >60 mL/min (ref >60)
Glucose, Bld: 143 mg/dL — ABNORMAL HIGH (ref 70–99)
Potassium: 4.7 mmol/L (ref 3.5–5.1)
Sodium: 141 mmol/L (ref 135–145)

## 2019-07-09 LAB — HIV ANTIBODY (ROUTINE TESTING W REFLEX): HIV Screen 4th Generation wRfx: NONREACTIVE

## 2019-07-09 MED ORDER — NICOTINE 14 MG/24HR TD PT24
14.0000 mg | MEDICATED_PATCH | Freq: Every day | TRANSDERMAL | Status: DC
  Filled 2019-07-09 (×2): qty 1

## 2019-07-09 MED ORDER — DIGOXIN 250 MCG PO TABS
0.2500 mg | ORAL_TABLET | Freq: Every day | ORAL | Status: DC
  Administered 2019-07-09 – 2019-07-13 (×5): 0.25 mg via ORAL
  Filled 2019-07-09 (×5): qty 1

## 2019-07-09 MED ORDER — DIGOXIN 0.25 MG/ML IJ SOLN: 0.2500 mg | Freq: Every day | INTRAMUSCULAR | Status: DC

## 2019-07-09 MED ORDER — DIPHENHYDRAMINE HCL 50 MG/ML IJ SOLN
25.0000 mg | Freq: Every day | INTRAMUSCULAR | Status: DC
  Administered 2019-07-11 – 2019-07-12 (×2): 25 mg via INTRAVENOUS
  Filled 2019-07-09 (×3): qty 1

## 2019-07-09 MED ORDER — BUDESONIDE 0.5 MG/2ML IN SUSP
0.5000 mg | Freq: Two times a day (BID) | RESPIRATORY_TRACT | Status: DC
  Administered 2019-07-10 – 2019-07-13 (×7): 0.5 mg via RESPIRATORY_TRACT
  Filled 2019-07-09 (×8): qty 2

## 2019-07-09 MED ORDER — METHYLPREDNISOLONE SODIUM SUCC 125 MG IJ SOLR
60.0000 mg | Freq: Two times a day (BID) | INTRAMUSCULAR | Status: DC
  Administered 2019-07-10 – 2019-07-13 (×7): 60 mg via INTRAVENOUS
  Filled 2019-07-09 (×7): qty 2

## 2019-07-09 NOTE — Care Management Important Message (Signed)
Important Message  Patient Details  Name: Theresa [name] MRN: [id number] Date of Birth: 1956-03-26   Medicare Important Message Given:  Yes     [name] 07/09/2019, 12:41 PM

## 2019-07-09 NOTE — Consult Note (Signed)
Pulmonary Medicine          Date: 07/09/2019,   MRN# [id number] Theresa [name] January 01, 1956     AdmissionWeight: 109.4 kg                 CurrentWeight: 109.1 kg      CHIEF COMPLAINT:   Acute respiratory distress   HISTORY OF PRESENT ILLNESS   This is a pleasant 63 year old female with a history of advanced COPD currently on nocturnal BiPAP as well as 2 to 3 L/min nasal cannula oxygen therapy at home, also has a history of CHF and moderate pulmonary hypertension based on screening transthoracic echo done on this admission, as well as degenerative disc disease requiring chronic opioid therapy with pain management services, GERD, morbid obesity, depression, essential hypertension, lifelong smoking status actively smoking, came in after 1 to 2 weeks of feeling worse compared to baseline stating that over the last few days she was unable to dress herself without feelings of dyspnea and required help from husband with bathing due to breathlessness.  She was placed on 3 L oxygen in the ED due to hypoxemia.  She was recently seen pulmonology Dr. Raul Del for moderate COPD exacerbation.  Patient states that over the last few days she is noticed worsening wheezing bilaterally.  She is admitted to hospitalist service for COPD exacerbation as well as CHF exacerbation and was started on diuresis with Lasix.  Cardiology consultation was placed and was ruled out for MI due to serial high-sensitivity troponin is less than 10, no indication for left heart cath for IV heparin.  She was placed on IV steroids as well as bronchodilators for underlying COPD with possible exacerbation.  She has bilateral interstitial prominence with generous PA diameter suggestive of pulmonary arterial hypertension on CT chest and transthoracic echo was done on this admission showing moderate PAH.  PAST MEDICAL HISTORY   Past Medical History:  Diagnosis Date   Acute postoperative pain 10/03/2017   Anxiety    BiPAP  (biphasic positive airway pressure) dependence    at hs   Carpal tunnel syndrome    CHF (congestive heart failure) ([place])    1/18   CHF (congestive heart failure) ([facility])    Chronic generalized abdominal pain    Chronic rhinitis    COPD (chronic obstructive pulmonary disease) ([facility])    DDD (degenerative disc disease), cervical    DDD (degenerative disc disease), lumbosacral    Depression    Dyspnea    Edema    Flu    1/18   Gastritis    GERD (gastroesophageal reflux disease)    Hematuria    Hernia of abdominal wall 07/17/2018   Hernia, abdominal    Hypertension    Kidney stones    Low back pain    Lumbar radiculitis    Malodorous urine    Muscle weakness    Obesity    Oxygen dependent    Renal cyst    Sensory urge incontinence    Thyroid activity decreased    9/19   Tobacco abuse    Wheezing      SURGICAL HISTORY   Past Surgical History:  Procedure Laterality Date   ABDOMINAL HYSTERECTOMY     CARPAL TUNNEL RELEASE Left 2012   EPIGASTRIC HERNIA REPAIR N/A 08/13/2018   HERNIA REPAIR EPIGASTRIC ADULT; polypropylene mesh reinforcement.  Surgeon: Robert Bellow, MD;  Location: [facility] OR[facility];  Service: General;  Laterality: N/A;   TONSILLECTOMY     TUBAL  LIGATION     UMBILICAL HERNIA REPAIR N/A 08/13/2018   HERNIA REPAIR UMBILICAL ADULT; polypropylene mesh reinforcement Surgeon: Robert Bellow, MD;  Location: [facility];  Service: General;  Laterality: N/A;     FAMILY HISTORY   Family History  Problem Relation Age of Onset   Heart disease Mother    Stroke Mother    Coronary artery disease Mother    Lung cancer Sister    Cancer Sister    Breast cancer Sister    Alcohol abuse Father    Heart disease Father    Cancer Brother    Cancer Brother    Pneumonia Brother    Prostate cancer Neg Hx    Kidney cancer Neg Hx    Bladder Cancer Neg Hx      SOCIAL HISTORY   Social History   Tobacco Use   Smoking  status: Current Every Day Smoker    Packs/day: 1.00    Years: 44.00    Pack years: 44.00    Types: Cigarettes   Smokeless tobacco: Never Used  Substance Use Topics   Alcohol use: No    Frequency: Never   Drug use: No     MEDICATIONS    Home Medication:    Current Medication:  Current Facility-Administered Medications:    0.9 %  sodium chloride infusion, 250 mL, Intravenous, PRN, Demetrios Loll, MD   0.9 %  sodium chloride infusion, , Intravenous, Continuous, Mayo, Pete Pelt, MD   acetaminophen (TYLENOL) tablet 650 mg, 650 mg, Oral, Q6H PRN, 650 mg at 07/07/19 0617 **OR** acetaminophen (TYLENOL) suppository 650 mg, 650 mg, Rectal, Q6H PRN, Demetrios Loll, MD   albuterol (PROVENTIL) (2.5 MG/3ML) 0.083% nebulizer solution 2.5 mg, 2.5 mg, Nebulization, Q2H PRN, Demetrios Loll, MD, 2.5 mg at 07/07/19 1137   ALPRAZolam (XANAX) tablet 0.5 mg, 0.5 mg, Oral, Daily PRN, Demetrios Loll, MD, 0.5 mg at 07/09/19 1106   aspirin EC tablet 81 mg, 81 mg, Oral, Daily, Demetrios Loll, MD, 81 mg at 07/09/19 6503   azithromycin (ZITHROMAX) tablet 500 mg, 500 mg, Oral, Daily, Demetrios Loll, MD, 500 mg at 07/08/19 2014   bisacodyl (DULCOLAX) EC tablet 5 mg, 5 mg, Oral, Daily PRN, Demetrios Loll, MD   chlorhexidine (PERIDEX) 0.12 % solution 15 mL, 15 mL, Mouth Rinse, BID, Mayo, Pete Pelt, MD, 15 mL at 07/09/19 5465   cholecalciferol (VITAMIN D3) tablet 2,000 Units, 2,000 Units, Oral, Daily, Demetrios Loll, MD, 2,000 Units at 07/09/19 0929   cyclobenzaprine (FLEXERIL) tablet 5 mg, 5 mg, Oral, BID, Tressia Miners, Radhika, MD, 5 mg at 07/09/19 0929   enoxaparin (LOVENOX) injection 40 mg, 40 mg, Subcutaneous, Q12H, Demetrios Loll, MD, 40 mg at 07/09/19 0929   fluticasone furoate-vilanterol (BREO ELLIPTA) 100-25 MCG/INH 1 puff, 1 puff, Inhalation, Daily, 1 puff at 07/08/19 0841 **OR** umeclidinium bromide (INCRUSE ELLIPTA) 62.5 MCG/INH 1 puff, 1 puff, Inhalation, Daily, Demetrios Loll, MD, 1 puff at 07/09/19 0929   furosemide (LASIX)  injection 20 mg, 20 mg, Intravenous, Q8H, Kalisetti, Radhika, MD, 20 mg at 07/08/19 1554   gabapentin (NEURONTIN) capsule 300 mg, 300 mg, Oral, QID, Demetrios Loll, MD, 300 mg at 07/09/19 0929   guaiFENesin (ROBITUSSIN) 100 MG/5ML solution 100 mg, 5 mL, Oral, Q4H PRN, Demetrios Loll, MD   HYDROcodone-acetaminophen (NORCO/VICODIN) 5-325 MG per tablet 1 tablet, 1 tablet, Oral, Q8H PRN, Gladstone Lighter, MD, 1 tablet at 07/09/19 1157   ipratropium-albuterol (DUONEB) 0.5-2.5 (3) MG/3ML nebulizer solution 3 mL, 3 mL, Nebulization, Q6H, Mayo, Pete Pelt, MD,  3 mL at 07/09/19 9833   levothyroxine (SYNTHROID) tablet 25 mcg, 25 mcg, Oral, QAC breakfast, Demetrios Loll, MD, 25 mcg at 07/09/19 0640   MEDLINE mouth rinse, 15 mL, Mouth Rinse, q12n4p, Mayo, Pete Pelt, MD, 15 mL at 07/08/19 1600   methylPREDNISolone sodium succinate (SOLU-MEDROL) 125 mg/2 mL injection 60 mg, 60 mg, Intravenous, Q6H, Demetrios Loll, MD, 60 mg at 07/09/19 8250   morphine 4 MG/ML injection 4 mg, 4 mg, Intravenous, Q4H PRN, Mayo, Pete Pelt, MD, 4 mg at 07/07/19 1202   nitroGLYCERIN (NITROSTAT) SL tablet 0.4 mg, 0.4 mg, Sublingual, Q5 min PRN, Mayo, Pete Pelt, MD, 0.4 mg at 07/07/19 1220   ondansetron (ZOFRAN) tablet 4 mg, 4 mg, Oral, Q6H PRN **OR** ondansetron (ZOFRAN) injection 4 mg, 4 mg, Intravenous, Q6H PRN, Demetrios Loll, MD   potassium chloride SA (K-DUR) CR tablet 20 mEq, 20 mEq, Oral, BID, Demetrios Loll, MD, 20 mEq at 07/09/19 5397   senna-docusate (Senokot-S) tablet 1 tablet, 1 tablet, Oral, QHS PRN, Demetrios Loll, MD   sertraline (ZOLOFT) tablet 200 mg, 200 mg, Oral, Daily, Demetrios Loll, MD, 200 mg at 07/09/19 6734   sodium chloride flush (NS) 0.9 % injection 3 mL, 3 mL, Intravenous, Once, Earleen Newport, MD   sodium chloride flush (NS) 0.9 % injection 3 mL, 3 mL, Intravenous, Q12H, Demetrios Loll, MD, 3 mL at 07/08/19 2136   sodium chloride flush (NS) 0.9 % injection 3 mL, 3 mL, Intravenous, PRN, Demetrios Loll, MD    ALLERGIES    Codeine and Meloxicam     REVIEW OF SYSTEMS    Review of Systems:  Gen:  Denies  fever, sweats, chills weigh loss  HEENT: Denies blurred vision, double vision, ear pain, eye pain, hearing loss, nose bleeds, sore throat Cardiac:  No dizziness, chest pain or heaviness, chest tightness,edema Resp:  Reports cough, sputum porduction, shortness of breath,wheezing,no hemoptysis,  Gi: Denies swallowing difficulty, stomach pain, nausea or vomiting, diarrhea, constipation, bowel incontinence Gu:  Denies bladder incontinence, burning urine Ext:   Denies Joint pain, stiffness or swelling Skin: Denies  skin rash, easy bruising or bleeding or hives Endoc:  Denies polyuria, polydipsia , polyphagia or weight change Psych:   Denies depression, insomnia or hallucinations   Other:  All other systems negative   VS: BP 108/67 (BP Location: Left Arm)    Pulse 62    Temp 97.8 F (36.6 C) (Oral)    Resp 19    Ht _0  (1.575 m)    Wt 109.1 kg    SpO2 94%    BMI 43.99 kg/m      PHYSICAL EXAM    GENERAL:NAD, no fevers, chills, no weakness no fatigue HEAD: Normocephalic, atraumatic.  EYES: Pupils equal, round, reactive to light. Extraocular muscles intact. No scleral icterus.  MOUTH: Moist mucosal membrane. Dentition intact. No abscess noted.  EAR, NOSE, THROAT: Clear without exudates. No external lesions.  NECK: Supple. No thyromegaly. No nodules. No JVD.  PULMONARY: b/l pan inspiratory/exp +wheezes CARDIOVASCULAR: S1 and S2. Regular rate and rhythm. No murmurs, rubs, or gallops. No edema. Pedal pulses 2+ bilaterally.  GASTROINTESTINAL: Soft, nontender, nondistended. No masses. Positive bowel sounds. No hepatosplenomegaly.  MUSCULOSKELETAL: No swelling, clubbing, or edema. Range of motion full in all extremities.  NEUROLOGIC: Cranial nerves II through XII are intact. No gross focal neurological deficits. Sensation intact. Reflexes intact.  SKIN: No ulceration, lesions, rashes, or cyanosis. Skin  warm and dry. Turgor intact.  PSYCHIATRIC: Mood, affect within normal limits.  The patient is awake, alert and oriented x 3. Insight, judgment intact.       IMAGING    Dg Chest 1 View  Result Date: 07/06/2019 CLINICAL DATA:  Shortness of breath, chest pain EXAM: CHEST  1 VIEW COMPARISON:  02/07/2018 FINDINGS: Cardiomegaly. Mild, diffuse bilateral interstitial pulmonary opacity. The visualized skeletal structures are unremarkable. IMPRESSION: Cardiomegaly with mild, diffuse bilateral interstitial pulmonary opacity, likely mild edema. No focal airspace opacity. Electronically Signed   By: Eddie Candle M.D.   On: 07/06/2019 15:15   Ct Angio Chest Pe W Or Wo Contrast  Result Date: 07/07/2019 CLINICAL DATA:  Chest pain and worsening shortness of breath for 2 days. History of wheezing, hypertension, COPD, CHF, smoker. EXAM: CT ANGIOGRAPHY CHEST WITH CONTRAST TECHNIQUE: Multidetector CT imaging of the chest was performed using the standard protocol during bolus administration of intravenous contrast. Multiplanar CT image reconstructions and MIPs were obtained to evaluate the vascular anatomy. CONTRAST:  53m OMNIPAQUE IOHEXOL 350 MG/ML SOLN COMPARISON:  Chest CT dated 03/25/2019. FINDINGS: Cardiovascular: Some of the peripheral segmental and subsegmental pulmonary artery branches are difficult to definitively characterize due to patient breathing motion artifact, however, there is no pulmonary embolism identified within the main, lobar or segmental pulmonary arteries bilaterally. No thoracic aortic aneurysm or evidence of aortic dissection. Mild aortic atherosclerosis. Cardiomegaly. No pericardial effusion. Diffuse coronary artery calcifications, particularly dense within the LEFT anterior descending coronary artery. Mediastinum/Nodes: No mass or enlarged lymph nodes seen within the mediastinum or perihilar regions. Esophagus appears normal. Trachea appears normal. Lungs/Pleura: Mild bibasilar  atelectasis/scarring. Mild centrilobular and paraseptal emphysema. Stable linear consolidations within the medial aspects of each lung suggesting chronic MAI. No evidence of acute pneumonia or pulmonary edema. Upper Abdomen: No acute findings on limited images of the upper abdomen. Previously described cysts within the liver, again too small to definitively characterize. Musculoskeletal: No acute or suspicious osseous finding. Mild degenerative spurring within the thoracic spine. Review of the MIP images confirms the above findings. IMPRESSION: 1. No pulmonary embolism seen, with mild study limitations detailed above. 2. Cardiomegaly. No pericardial effusion. 3. Diffuse coronary artery calcifications, particularly dense within the LEFT anterior descending coronary artery. Recommend correlation with any possible associated cardiac symptoms. 4. Chronic/incidental lung findings detailed above. No evidence of active pneumonia or pulmonary edema. Aortic Atherosclerosis (ICD10-I70.0) and Emphysema (ICD10-J43.9). Electronically Signed   By: SFranki CabotM.D.   On: 07/07/2019 11:20    Sonographer Comments: Suboptimal apical window. IMPRESSIONS    1. Left ventricular ejection fraction, by visual estimation, is 60 to 65%. The left ventricle has normal function. Left ventricular septal wall thickness was normal. Normal left ventricular posterior wall thickness. There is no left ventricular  hypertrophy.  2. Global right ventricle has normal systolic function.The right ventricular size is mildly enlarged. No increase in right ventricular wall thickness.  3. Left atrial size was normal.  4. Right atrial size was mildly dilated.  5. The mitral valve is grossly normal. Trace mitral valve regurgitation.  6. The tricuspid valve is not well visualized. Tricuspid valve regurgitation is mild.  7. The aortic valve was not well visualized Aortic valve regurgitation is trivial by color flow Doppler.  8. The pulmonic  valve was not well visualized. Pulmonic valve regurgitation is trivial by color flow Doppler.  9. Moderately elevated pulmonary artery systolic pressure. 10. The interatrial septum was not assessed.  FINDINGS  Left Ventricle: Left ventricular ejection fraction, by visual estimation, is 60 to 65%. The left ventricle has  normal function. Left ventricular septal wall thickness was normal. Normal left ventricular posterior wall thickness. There is no left  ventricular hypertrophy.  Right Ventricle: The right ventricular size is mildly enlarged. No increase in right ventricular wall thickness. Global RV systolic function is has normal systolic function. The tricuspid regurgitant velocity is 3.51 m/s, and with an assumed right atrial  pressure of 10 mmHg, the estimated right ventricular systolic pressure is moderately elevated at 59.4 mmHg.  Left Atrium: Left atrial size was normal in size.  Right Atrium: Right atrial size was mildly dilated  Pericardium: There is no evidence of pericardial effusion.     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 -Agree with Solumedrol and DuoNebs -Adding MetaNeb with Saline for recruitment and BPH - IS at bedside  - minimize narcotics and encourage OOB as well as work with PT  - procalcitonin for antimicrobial guidance   -currently on Zithromax - no grossly appreciable infiltrate on CT chest to suggest PNA and WBC elevation likely from peripheral demargination -TTE with relatively preserved LV function -will add pulmicort due to continued wheezing -continue noctural BIPAP on home settings     Moderate pulmonary HTN   - will obtain BNP    - likely group 3 due to chronic pulmonary dz   - adding digoxin 250 po daily for now    - would ideally like to diurese more but pressure is borderline   - cardiology on case - appreciate input - pt may need RHC on OP basis   -pt to follow up with primary pulmonologist Dr  Raul Del post d/c      Tobacco abuse    - counseling for smoking cessation    - transdermal nicotine patch 66m     Thank you for allowing me to participate in the care of this patient.   Patient/Family are satisfied with care plan and all questions have been answered.  This document was prepared using Dragon voice recognition software and may include unintentional dictation errors.     FOttie Glazier M.D.  Division of [facility]

## 2019-07-09 NOTE — Plan of Care (Signed)
Pt c/o dyspnea while at rest throughout shift.  Continues to experience expiratory wheezing.   Problem: Education: Goal: Knowledge of General Education information will improve Description: Including pain rating scale, medication(s)/side effects and non-pharmacologic comfort measures Outcome: Progressing   Problem: Health Behavior/Discharge Planning: Goal: Ability to manage health-related needs will improve Outcome: Progressing   Problem: Clinical Measurements: Goal: Ability to maintain clinical measurements within normal limits will improve Outcome: Progressing Goal: Will remain free from infection Outcome: Progressing Goal: Diagnostic test results will improve Outcome: Progressing Goal: Respiratory complications will improve Outcome: Progressing Goal: Cardiovascular complication will be avoided Outcome: Progressing   Problem: Activity: Goal: Risk for activity intolerance will decrease Outcome: Progressing   Problem: Nutrition: Goal: Adequate nutrition will be maintained Outcome: Progressing   Problem: Coping: Goal: Level of anxiety will decrease Outcome: Progressing   Problem: Elimination: Goal: Will not experience complications related to bowel motility Outcome: Progressing Goal: Will not experience complications related to urinary retention Outcome: Progressing   Problem: Pain Managment: Goal: General experience of comfort will improve Outcome: Progressing   Problem: Safety: Goal: Ability to remain free from injury will improve Outcome: Progressing   Problem: Skin Integrity: Goal: Risk for impaired skin integrity will decrease Outcome: Progressing

## 2019-07-09 NOTE — Progress Notes (Signed)
[facility] [place] NAME: Theresa [name]    MR#:  [id number]  DATE OF BIRTH:  January 08, 1956  SUBJECTIVE:  CHIEF COMPLAINT:   Chief Complaint  Patient presents with   Chest Pain   Shortness of Breath   -Patient complains of chest pain, back pain and abdominal pain again. -Remains on 5 L oxygen and continues to wheeze.  Blood pressure is borderline low  REVIEW OF SYSTEMS:  Review of Systems  Constitutional: Positive for malaise/fatigue. Negative for chills and fever.  HENT: Negative for congestion, ear discharge, hearing loss and nosebleeds.   Respiratory: Positive for shortness of breath and wheezing. Negative for cough.   Cardiovascular: Positive for orthopnea and leg swelling. Negative for chest pain and palpitations.  Gastrointestinal: Negative for abdominal pain, constipation, diarrhea, nausea and vomiting.  Genitourinary: Negative for dysuria.  Musculoskeletal: Positive for back pain and myalgias.  Neurological: Negative for dizziness, focal weakness, seizures, weakness and headaches.  Psychiatric/Behavioral: Negative for depression.    DRUG ALLERGIES:   Allergies  Allergen Reactions   Codeine Nausea And Vomiting   Meloxicam Other (See Comments)    Stomach pain    VITALS:  Blood pressure 108/67, pulse 62, temperature 97.8 F (36.6 C), temperature source Oral, resp. rate 19, height _0  (1.575 m), weight 109.1 kg, SpO2 94 %.  PHYSICAL EXAMINATION:  Physical Exam   GENERAL:  63 y.o.-year-old obese patient lying in the bed and appears to be in mild respiratory distress.  EYES: Pupils equal, round, reactive to light and accommodation. No scleral icterus. Extraocular muscles intact.  HEENT: Head atraumatic, normocephalic. Oropharynx and nasopharynx clear.  NECK:  Supple, no jugular venous distention. No thyroid enlargement, no tenderness.  LUNGS: Diffuse expiratory wheezing all over the lung fields, no crackles or rales.   Using accessory muscles to breathe on exertion.Marland Kitchen  CARDIOVASCULAR: S1, S2 normal. No  rubs, or gallops.  2/6 systolic murmur is present ABDOMEN: Soft, nontender, nondistended. Bowel sounds present. No organomegaly or mass.  EXTREMITIES: No pedal edema, cyanosis, or clubbing.  NEUROLOGIC: Cranial nerves II through XII are intact. Muscle strength 5/5 in all extremities. Sensation intact. Gait not checked.  Global weakness noted PSYCHIATRIC: The patient is alert and oriented x 3.  SKIN: No obvious rash, lesion, or ulcer.    LABORATORY PANEL:   CBC Recent Labs  Lab 07/08/19 0402  WBC 14.3*  HGB 14.4  HCT 48.1*  PLT 201   ------------------------------------------------------------------------------------------------------------------  Chemistries  Recent Labs  Lab 07/06/19 1452  07/09/19 0441  NA 143   < > 141  K 3.6   < > 4.7  CL 104   < > 100  CO2 28   < > 33*  GLUCOSE 154*   < > 143*  BUN 19   < > 33*  CREATININE 0.79   < > 0.79  CALCIUM 9.2   < > 8.8*  MG 2.1  --   --    < > = values in this interval not displayed.   ------------------------------------------------------------------------------------------------------------------  Cardiac Enzymes No results for input(s): TROPONINI in the last 168 hours. ------------------------------------------------------------------------------------------------------------------  RADIOLOGY:  No results found.  EKG:   Orders placed or performed during the hospital encounter of 07/06/19   EKG 12-Lead   EKG 12-Lead   ED EKG   ED EKG   EKG 12-Lead   EKG 12-Lead   EKG - 12 lead   EKG - 12 lead    ASSESSMENT AND  PLAN:   63 y/o Female with PMH significant for chronic respiratory failure secondary to COPD on 2.5 L home oxygen, sleep apnea on CPAP, obesity, GERD, low back pain, hypertension, diastolic CHF presents to hospital secondary to worsening shortness of breath for almost 1 week.  1.  Acute on chronic  respiratory failure-secondary to COPD and CHF exacerbation -Currently on 5 L oxygen here. -Appreciate cardiology consult. -on IV Lasix, strict input and output monitoring -on IV steroids, inhalers and nebulizers-continues to have significant wheeze.  Using BiPAP at bedtime as well. -Requested pulmonary consult today. -On oral Zithromax -CT angiogram negative for pulmonary embolism  2.  Acute on chronic diastolic heart failure-echo with EF of 60 to 65%. -Appreciate cardiology consult.  Continue IV Lasix.  Echo with moderately elevated pulmonary artery pressures. -Continue cardiac medications.  Hold lisinopril and bisoprolol due to hypotension.  3.  Depression-on Zoloft  4.  Hypothyroidism-Synthroid-  5.  DVT prophylaxis-Lovenox  6.  CAD-patient has chronic angina and musculoskeletal chest pain.  Blood pressure dropped significantly with 1 dose of sublingual nitro.  So will hold nitro.  Troponins are negative.  Appreciate cardiology input. -Has significant coronary artery disease on CT angiogram but no acute symptoms. -Outpatient cardiac cath recommended at this point   Independent at baseline, lives at home with husband PT consult tomorrow   All the records are reviewed and case discussed with Care Management/Social Workerr. Management plans discussed with the patient, family and they are in agreement.  CODE STATUS: Full code  TOTAL TIME TAKING CARE OF THIS PATIENT: 37 minutes.   POSSIBLE D/C IN 2-3 DAYS, DEPENDING ON CLINICAL CONDITION.   Gladstone Lighter M.D on 07/09/2019 at 12:52 PM  Between 7am to 6pm - Pager - [phone]  After 6pm go to [email or web address] - password [email or web address] [facility]  Office  [phone]  CC: Primary care physician; Chrismon, Vickki Muff, PA

## 2019-07-09 NOTE — Progress Notes (Signed)
LLQ abdominal tenderness

## 2019-07-09 NOTE — Evaluation (Signed)
Physical Therapy Evaluation Patient Details Name: Theresa [name] MRN: [id number] DOB: 1956/10/09 Today's Date: 07/09/2019   History of Present Illness  Pt admitted for acute on chronic respiratory failure with hypoxia with complaints of chest pain and SOB. History includes chronic 2L of O2, anxiety, CHF, COPD, DDD, and depression. Pt continues to have chest pain, ruled out for MI, cleared by cardio.   Clinical Impression  Pt is a pleasant 63 year old female who was admitted for acute on chronic respiratory failure with hypoxia. Pt performs bed mobility/transfers with supervision and ambulation with cga and RW. Pt reports several falls in past 6 months, reporting she "trips on air". Pt currently limited by endurance with O2 sats decreasing quickly with limited exertion. All mobility performed on 6L of O2 with sats decreasing to 83%. Cues for pursed lip breathing with seated rest break in order to improve sats to 90%. Pt demonstrates deficits with strength/mobility/endurance. Recommend continued use of RW for all mobility at this time to improve safety, decrease falls, improve endurance. Would benefit from skilled PT to address above deficits and promote optimal return to PLOF. Recommend transition to [place] upon discharge from acute hospitalization.      Follow Up Recommendations Home health PT;Supervision for mobility/OOB    Equipment Recommendations  None recommended by PT    Recommendations for Other Services       Precautions / Restrictions Precautions Precautions: Fall Restrictions Weight Bearing Restrictions: No      Mobility  Bed Mobility Overal bed mobility: Needs Assistance Bed Mobility: Supine to Sit     Supine to sit: Supervision     General bed mobility comments: safe technique. Use of bed rail for assistance  Transfers Overall transfer level: Needs assistance Equipment used: Rolling walker (2 wheeled) Transfers: Sit to/from Stand Sit to Stand: Supervision          General transfer comment: pulls up on RW. ONce standing, demonstrates upright posture. All mobility performed on 6L of O2 with O2 sats at 90% with resting.  Ambulation/Gait Ambulation/Gait assistance: Min guard Gait Distance (Feet): 50 Feet Assistive device: Rolling walker (2 wheeled) Gait Pattern/deviations: Step-to pattern     General Gait Details: ambulated with slightly increased R step length. First ambulation bout to chair with O2 sats able to maintain. Further ambulation further in room. Impulsive and needs cues for line management. Fatigues quickly with exertion and begins wheezing. O2 sats decrease to 83% with exertion on 6L of O2. Further ambulation distance deferred  Stairs            Wheelchair Mobility    Modified Rankin (Stroke Patients Only)       Balance Overall balance assessment: Needs assistance;History of Falls Sitting-balance support: Feet supported Sitting balance-Leahy Scale: Good     Standing balance support: Bilateral upper extremity supported Standing balance-Leahy Scale: Fair                               Pertinent Vitals/Pain Pain Assessment: No/denies pain    Home Living Family/patient expects to be discharged to:: Private residence Living Arrangements: Spouse/significant other Available Help at Discharge: Family(spouse is home at night/works during day) Type of Home: House Home Access: Stairs to enter Entrance Stairs-Rails: Left Entrance Stairs-Number of Steps: 4 Home Layout: One level Home Equipment: Walker - 4 wheels      Prior Function Level of Independence: Independent with assistive device(s)  Comments: does use rollater for further distances, up until admission, was independent with all ADLs.     Hand Dominance        Extremity/Trunk Assessment   Upper Extremity Assessment Upper Extremity Assessment: Overall WFL for tasks assessed    Lower Extremity Assessment Lower Extremity Assessment:  Generalized weakness(B LE grossly 4+/5)       Communication   Communication: No difficulties  Cognition Arousal/Alertness: Awake/alert Behavior During Therapy: WFL for tasks assessed/performed Overall Cognitive Status: Within Functional Limits for tasks assessed                                        General Comments      Exercises Other Exercises Other Exercises: supine ther-ex performed on B LE including AP, SLRs, SAQ, and quad sets. 10 reps with supervision. Cues for breathing as she tend to hold breath.   Assessment/Plan    PT Assessment Patient needs continued PT services  PT Problem List Decreased strength;Decreased activity tolerance;Decreased balance;Decreased mobility;Decreased safety awareness;Cardiopulmonary status limiting activity       PT Treatment Interventions Gait training;DME instruction;Therapeutic exercise;Balance training    PT Goals (Current goals can be found in the Care Plan section)  Acute Rehab PT Goals Patient Stated Goal: to breathe easier PT Goal Formulation: With patient Time For Goal Achievement: 07/23/19 Potential to Achieve Goals: Good    Frequency Min 2X/week   Barriers to discharge        Co-evaluation               AM-PAC PT "6 Clicks" Mobility  Outcome Measure Help needed turning from your back to your side while in a flat bed without using bedrails?: None Help needed moving from lying on your back to sitting on the side of a flat bed without using bedrails?: None Help needed moving to and from a bed to a chair (including a wheelchair)?: A Little Help needed standing up from a chair using your arms (e.g., wheelchair or bedside chair)?: A Little Help needed to walk in hospital room?: A Little Help needed climbing 3-5 steps with a railing? : A Lot 6 Click Score: 19    End of Session Equipment Utilized During Treatment: Gait belt;Oxygen Activity Tolerance: Patient tolerated treatment well Patient left: in  chair;with chair alarm set Nurse Communication: Mobility status PT Visit Diagnosis: History of falling (Z91.81);Repeated falls (R29.6);Muscle weakness (generalized) (M62.81);Unsteadiness on feet (R26.81)    Time: 1530-1611 PT Time Calculation (min) (ACUTE ONLY): 41 min   Charges:   PT Evaluation $PT Eval Low Complexity: 1 Low PT Treatments $Gait Training: 8-22 mins $Therapeutic Exercise: 8-22 mins        Greggory Stallion, PT, DPT [phone]   Theresa [name] 07/09/2019, 4:33 PM

## 2019-07-10 LAB — BASIC METABOLIC PANEL
Anion gap: 7 (ref 5–15)
BUN: 32 mg/dL — ABNORMAL HIGH (ref 8–23)
CO2: 37 mmol/L — ABNORMAL HIGH (ref 22–32)
Calcium: 8.9 mg/dL (ref 8.9–10.3)
Chloride: 99 mmol/L (ref 98–111)
Creatinine, Ser: 0.83 mg/dL (ref 0.44–1.00)
GFR calc Af Amer: >60 mL/min (ref >60)
GFR calc non Af Amer: >60 mL/min (ref >60)
Glucose, Bld: 92 mg/dL (ref 70–99)
Potassium: 4.8 mmol/L (ref 3.5–5.1)
Sodium: 143 mmol/L (ref 135–145)

## 2019-07-10 MED ORDER — LIDOCAINE 5 % EX PTCH
1.0000 | MEDICATED_PATCH | CUTANEOUS | Status: DC
  Administered 2019-07-10 – 2019-07-13 (×4): 1 via TRANSDERMAL
  Filled 2019-07-10 (×4): qty 1

## 2019-07-10 MED ORDER — SODIUM CHLORIDE 0.9 % IV SOLN: INTRAVENOUS | Status: DC

## 2019-07-10 MED ORDER — CYCLOBENZAPRINE HCL 5 MG PO TABS
7.5000 mg | ORAL_TABLET | Freq: Two times a day (BID) | ORAL | Status: DC
  Administered 2019-07-10 – 2019-07-12 (×5): 7.5 mg via ORAL
  Filled 2019-07-10 (×8): qty 1.5

## 2019-07-10 MED ORDER — ASPIRIN 81 MG PO CHEW
81.0000 mg | CHEWABLE_TABLET | ORAL | Status: AC
  Administered 2019-07-11: 81 mg via ORAL
  Filled 2019-07-10: qty 1

## 2019-07-10 MED ORDER — SODIUM CHLORIDE 0.9 % IV SOLN: 250.0000 mL | INTRAVENOUS | Status: DC | PRN

## 2019-07-10 NOTE — TOC Progression Note (Signed)
Transition of Care [facility]) - Progression Note    Patient Details  Name: Shauntay Brunelli MRN: [id number] Date of Birth: 1956/02/24  [facility] of [facility] CM/SW Contact  Elza Rafter, RN Phone Number: 07/10/2019, 1:13 PM  Clinical Narrative:   Tiajuana Amass to discharge-cardiac cath tomorrow; continue IV lasix and IV steroids.    Expected Discharge Plan: Home/Self Care    Expected Discharge Plan and Services Expected Discharge Plan: Home/Self Care       Living arrangements for the past 2 months: Single Family Home                                       Social Determinants of Health (SDOH) Interventions    Readmission Risk Interventions No flowsheet data found.

## 2019-07-10 NOTE — Plan of Care (Signed)
  Problem: Education: Goal: Knowledge of General Education information will improve Description: Including pain rating scale, medication(s)/side effects and non-pharmacologic comfort measures Outcome: Progressing   Problem: Nutrition: Goal: Adequate nutrition will be maintained Outcome: Progressing   Problem: Coping: Goal: Level of anxiety will decrease Outcome: Progressing   Problem: Elimination: Goal: Will not experience complications related to urinary retention Outcome: Progressing   Problem: Pain Managment: Goal: General experience of comfort will improve Outcome: Progressing   Problem: Safety: Goal: Ability to remain free from injury will improve Outcome: Progressing

## 2019-07-10 NOTE — Plan of Care (Signed)
Nutrition Education Note  RD consulted for nutrition education regarding CHF.  64 y/o female with CHF  Spoke with pt today, pt reports good appetite and oral intake today and pta. Pt asking today, what she can eat on a low sodium diet.   RD provided "Low Sodium Nutrition Therapy" handout from the Academy of [employer]. Reviewed patient's dietary recall. Provided examples on ways to decrease sodium intake in diet. Discouraged intake of processed foods and use of salt shaker. Encouraged fresh fruits and vegetables as well as whole grain sources of carbohydrates to maximize fiber intake.   RD discussed why it is important for patient to adhere to diet recommendations, and emphasized the role of fluids, foods to avoid, and importance of weighing self daily. Teach back method used.  Expect good compliance.  Body mass index is 43.99 kg/m. Pt meets criteria for morbid obesity based on current BMI.  Current diet order is HH, patient is consuming approximately 100% of meals at this time. Labs and medications reviewed. No further nutrition interventions warranted at this time. RD contact information provided. If additional nutrition issues arise, please re-consult RD.   Theresa Distance MS, RD, LDN Pager #- [phone] Office#- [phone] After Hours Pager: [phone]

## 2019-07-10 NOTE — Progress Notes (Signed)
Physical Therapy Treatment Patient Details Name: Theresa [name] MRN: [id number] DOB: 12/09/1955 Today's Date: 07/10/2019    History of Present Illness Pt admitted for acute on chronic respiratory failure with hypoxia with complaints of chest pain and SOB. History includes chronic 2L of O2, anxiety, CHF, COPD, DDD, and depression. Pt continues to have chest pain, ruled out for MI, cleared by cardio.     PT Comments    Pt is progressing toward goals. Upon entry, pt states that she has some back pain that she rates at 5/10 on NPS and that she has requested for her purewick to be checked; SpO2 is on 88%. Pt was able to safely perform tx session today on 4 L/min supplemental O2 via [place]. Therex and bed mobility activities performed successfully in supine with PT supervision. O2 dropped to 86 during therex, but was resolved with pursed lip breathing. Transfers and amb required CGA. During bout of amb, pt O2 dropped to 84%. Rest returned O2sat to 90%. Pt will continue to benefit from skilled PT to address impairments of balance, strength, mobility, and activity tolerance.    Follow Up Recommendations  Home health PT;Supervision for mobility/OOB     Equipment Recommendations  None recommended by PT    Recommendations for Other Services       Precautions / Restrictions Precautions Precautions: Fall Restrictions Weight Bearing Restrictions: No    Mobility  Bed Mobility Overal bed mobility: Needs Assistance Bed Mobility: Supine to Sit     Supine to sit: Supervision     General bed mobility comments: Use of bed rail for assistance  Transfers Overall transfer level: Needs assistance Equipment used: Rolling walker (2 wheeled) Transfers: Sit to/from Stand Sit to Stand: Supervision         General transfer comment: Relies heavily on RW for transfers, able to attain upright posture when seated. 4L O2 via [place]; SpO2 remained 88-90%  Ambulation/Gait Ambulation/Gait assistance: Min  guard Gait Distance (Feet): 40 Feet Assistive device: Rolling walker (2 wheeled) Gait Pattern/deviations: Step-to pattern     General Gait Details: Pt able to amb to sink; SpO2 dropped to 84% and wheezing was present. Use of 4L O2 throughout bout of amb   Stairs             Wheelchair Mobility    Modified Rankin (Stroke Patients Only)       Balance Overall balance assessment: Needs assistance;History of Falls Sitting-balance support: Feet supported Sitting balance-Leahy Scale: Good     Standing balance support: Bilateral upper extremity supported Standing balance-Leahy Scale: Fair                              Cognition Arousal/Alertness: Awake/alert Behavior During Therapy: WFL for tasks assessed/performed Overall Cognitive Status: Within Functional Limits for tasks assessed                                        Exercises Other Exercises Other Exercises: Supine, 10 reps, bilateral: AP, GS, hip add/abd, SAQ; supervision from PT    General Comments        Pertinent Vitals/Pain Pain Assessment: 0-10 Pain Score: 5  Pain Location: Back Pain Descriptors / Indicators: Aching Pain Intervention(s): Limited activity within patient's tolerance    Home Living  Prior Function            PT Goals (current goals can now be found in the care plan section) Acute Rehab PT Goals Patient Stated Goal: to breathe easier PT Goal Formulation: With patient Time For Goal Achievement: 07/23/19 Potential to Achieve Goals: Good Progress towards PT goals: Progressing toward goals    Frequency    Min 2X/week      PT Plan Current plan remains appropriate    Co-evaluation              AM-PAC PT "6 Clicks" Mobility   Outcome Measure  Help needed turning from your back to your side while in a flat bed without using bedrails?: None Help needed moving from lying on your back to sitting on the side of a  flat bed without using bedrails?: None Help needed moving to and from a bed to a chair (including a wheelchair)?: A Little Help needed standing up from a chair using your arms (e.g., wheelchair or bedside chair)?: A Little Help needed to walk in hospital room?: A Little Help needed climbing 3-5 steps with a railing? : A Lot 6 Click Score: 19    End of Session Equipment Utilized During Treatment: Gait belt;Oxygen Activity Tolerance: Patient tolerated treatment well Patient left: in bed;with nursing/sitter in room;with bed alarm set;with call bell/phone within reach Nurse Communication: (Pt requires purewick) PT Visit Diagnosis: History of falling (Z91.81);Repeated falls (R29.6);Muscle weakness (generalized) (M62.81);Unsteadiness on feet (R26.81)     Time: 8335-8251 PT Time Calculation (min) (ACUTE ONLY): 24 min  Charges:  $Gait Training: 8-22 mins $Therapeutic Exercise: 8-22 mins                     Lashonne Shull, SPT   Jennae Hakeem 07/10/2019, 4:59 PM

## 2019-07-10 NOTE — Progress Notes (Signed)
Theresa [name] NAME: Theresa [name]    MR#:  [id number]  DATE OF BIRTH:  June 27, 1956  SUBJECTIVE:  CHIEF COMPLAINT:   Chief Complaint  Patient presents with   Chest Pain   Shortness of Breath   -Wheezing is improved today.  Still has multiple pain complaints.  Complains of lower back pain with left leg sciatica this morning. -On 3 L oxygen.  Improved wheezing.  REVIEW OF SYSTEMS:  Review of Systems  Constitutional: Positive for malaise/fatigue. Negative for chills and fever.  HENT: Negative for congestion, ear discharge, hearing loss and nosebleeds.   Respiratory: Positive for shortness of breath and wheezing. Negative for cough.   Cardiovascular: Positive for orthopnea and leg swelling. Negative for chest pain and palpitations.  Gastrointestinal: Negative for abdominal pain, constipation, diarrhea, nausea and vomiting.  Genitourinary: Negative for dysuria.  Musculoskeletal: Positive for back pain and myalgias.  Neurological: Negative for dizziness, focal weakness, seizures, weakness and headaches.  Psychiatric/Behavioral: Negative for depression.    DRUG ALLERGIES:   Allergies  Allergen Reactions   Codeine Nausea And Vomiting   Meloxicam Other (See Comments)    Stomach pain    VITALS:  Blood pressure 114/72, pulse 64, temperature 97.7 F (36.5 C), temperature source Oral, resp. rate 17, height _0  (1.575 m), weight 109.1 kg, SpO2 95 %.  PHYSICAL EXAMINATION:  Physical Exam   GENERAL:  63 y.o.-year-old morbidly obese patient lying in the bed, not in any acute distress  EYES: Pupils equal, round, reactive to light and accommodation. No scleral icterus. Extraocular muscles intact.  HEENT: Head atraumatic, normocephalic. Oropharynx and nasopharynx clear.  NECK:  Supple, no jugular venous distention. No thyroid enlargement, no tenderness.  LUNGS: Improved expiratory wheezing all over the lung fields, no crackles or  rales.  Not using accessory muscles to breathe on exertion.Marland Kitchen  CARDIOVASCULAR: S1, S2 normal. No  rubs, or gallops.  2/6 systolic murmur is present ABDOMEN: Soft, nontender, nondistended. Bowel sounds present. No organomegaly or mass.  EXTREMITIES: No pedal edema, cyanosis, or clubbing.  NEUROLOGIC: Cranial nerves II through XII are intact. Muscle strength equal in all extremities. Sensation intact. Gait not checked.  Global weakness noted PSYCHIATRIC: The patient is alert and oriented x 3.  SKIN: No obvious rash, lesion, or ulcer.    LABORATORY PANEL:   CBC Recent Labs  Lab 07/08/19 0402  WBC 14.3*  HGB 14.4  HCT 48.1*  PLT 201   ------------------------------------------------------------------------------------------------------------------  Chemistries  Recent Labs  Lab 07/06/19 1452  07/10/19 0557  NA 143   < > 143  K 3.6   < > 4.8  CL 104   < > 99  CO2 28   < > 37*  GLUCOSE 154*   < > 92  BUN 19   < > 32*  CREATININE 0.79   < > 0.83  CALCIUM 9.2   < > 8.9  MG 2.1  --   --    < > = values in this interval not displayed.   ------------------------------------------------------------------------------------------------------------------  Cardiac Enzymes No results for input(s): TROPONINI in the last 168 hours. ------------------------------------------------------------------------------------------------------------------  RADIOLOGY:  No results found.  EKG:   Orders placed or performed during the hospital encounter of 07/06/19   EKG 12-Lead   EKG 12-Lead   ED EKG   ED EKG   EKG 12-Lead   EKG 12-Lead   EKG - 12 lead   EKG - 12 lead  ASSESSMENT AND PLAN:   63 y/o Female with PMH significant for chronic respiratory failure secondary to COPD on 2.5 L home oxygen, sleep apnea on CPAP, obesity, GERD, low back pain, hypertension, diastolic CHF presents to hospital secondary to worsening shortness of breath for almost 1 week.  1.  Acute on chronic  respiratory failure-secondary to COPD and CHF exacerbation -Improved oxygen requirements, on 3 L O2 now which is close to her home O2 requirements. -Appreciate cardiology consult. -Continue on IV Lasix, strict input and output monitoring -on IV steroids, inhalers and nebulizers-  Using BiPAP at bedtime as well. -Appreciate pulmonary consult, nebs have been adjusted, patient's breathing is much improved today. -On oral Zithromax -CT angiogram negative for pulmonary embolism  2.  Acute on chronic diastolic heart failure-echo with EF of 60 to 65%. -Appreciate cardiology consult.  Continue IV Lasix.  Echo with moderately elevated pulmonary artery pressures. -Digoxin added. -Continue cardiac medications.  Hold lisinopril and bisoprolol due to hypotension.  3.  Depression-on Zoloft  4.  Hypothyroidism-Synthroid-  5.  DVT prophylaxis-Lovenox  6.  CAD-patient has chronic angina and musculoskeletal chest pain.  Blood pressure dropped significantly with 1 dose of sublingual nitro.  So will hold nitro.  - Troponins are negative.  Appreciate cardiology input. -Has significant coronary artery disease on CT angiogram but no acute symptoms. -Plan for right heart cath and left heart cath tomorrow  7.  Low back pain-added lidocaine patch.   Independent at baseline, lives at home with husband Husband at bedside.  Physical therapy consulted   All the records are reviewed and case discussed with Care Management/Social Workerr. Management plans discussed with the patient, family and they are in agreement.  CODE STATUS: Full code  TOTAL TIME TAKING CARE OF THIS PATIENT: 37 minutes.   POSSIBLE D/C IN 2-3 DAYS, DEPENDING ON CLINICAL CONDITION.   Gladstone Lighter M.D on 07/10/2019 at 1:07 PM  Between 7am to 6pm - Pager - [phone]  After 6pm go to [email or web address] - password [email or web address] [facility]  Office  [phone]  CC: Primary care physician; Chrismon, Vickki Muff, PA

## 2019-07-10 NOTE — Progress Notes (Signed)
Pulmonary Medicine          Date: 07/10/2019,   MRN# [id number] Theresa [name] [birth year]     AdmissionWeight: 109.4 kg                 CurrentWeight: 109.1 kg      CHIEF COMPLAINT:   Acute respiratory distress   SUBJECTIVE   Patient has less wheezing on left and states she feels better.  Husband at bedside, care plan discussed and questions answered.   PAST MEDICAL HISTORY   Past Medical History:  Diagnosis Date   Acute postoperative pain 10/03/2017   Anxiety    BiPAP (biphasic positive airway pressure) dependence    at hs   Carpal tunnel syndrome    CHF (congestive heart failure) ([place])    1/18   CHF (congestive heart failure) ([facility])    Chronic generalized abdominal pain    Chronic rhinitis    COPD (chronic obstructive pulmonary disease) ([facility])    DDD (degenerative disc disease), cervical    DDD (degenerative disc disease), lumbosacral    Depression    Dyspnea    Edema    Flu    1/18   Gastritis    GERD (gastroesophageal reflux disease)    Hematuria    Hernia of abdominal wall 07/17/2018   Hernia, abdominal    Hypertension    Kidney stones    Low back pain    Lumbar radiculitis    Malodorous urine    Muscle weakness    Obesity    Oxygen dependent    Renal cyst    Sensory urge incontinence    Thyroid activity decreased    9/19   Tobacco abuse    Wheezing      SURGICAL HISTORY   Past Surgical History:  Procedure Laterality Date   ABDOMINAL HYSTERECTOMY     CARPAL TUNNEL RELEASE Left 2012   EPIGASTRIC HERNIA REPAIR N/A 08/13/2018   HERNIA REPAIR EPIGASTRIC ADULT; polypropylene mesh reinforcement.  Surgeon: Robert Bellow, MD;  Location: [facility];  Service: General;  Laterality: N/A;   TONSILLECTOMY     TUBAL LIGATION     UMBILICAL HERNIA REPAIR N/A 08/13/2018   HERNIA REPAIR UMBILICAL ADULT; polypropylene mesh reinforcement Surgeon: Robert Bellow, MD;  Location: [facility];  Service:  General;  Laterality: N/A;     FAMILY HISTORY   Family History  Problem Relation Age of Onset   Heart disease Mother    Stroke Mother    Coronary artery disease Mother    Lung cancer Sister    Cancer Sister    Breast cancer Sister    Alcohol abuse Father    Heart disease Father    Cancer Brother    Cancer Brother    Pneumonia Brother    Prostate cancer Neg Hx    Kidney cancer Neg Hx    Bladder Cancer Neg Hx      SOCIAL HISTORY   Social History   Tobacco Use   Smoking status: Current Every Day Smoker    Packs/day: 1.00    Years: 44.00    Pack years: 44.00    Types: Cigarettes   Smokeless tobacco: Never Used  Substance Use Topics   Alcohol use: No    Frequency: Never   Drug use: No     MEDICATIONS    Home Medication:    Current Medication:  Current Facility-Administered Medications:    0.9 %  sodium chloride infusion, 250 mL, Intravenous,  PRN, Demetrios Loll, MD   0.9 %  sodium chloride infusion, , Intravenous, Continuous, Mayo, Pete Pelt, MD   0.9 %  sodium chloride infusion, 250 mL, Intravenous, PRN, Teodoro Spray, MD   [START ON 07/11/2019] 0.9 %  sodium chloride infusion, , Intravenous, Continuous, Fath, Javier Docker, MD   acetaminophen (TYLENOL) tablet 650 mg, 650 mg, Oral, Q6H PRN, 650 mg at 07/07/19 0617 **OR** acetaminophen (TYLENOL) suppository 650 mg, 650 mg, Rectal, Q6H PRN, Demetrios Loll, MD   albuterol (PROVENTIL) (2.5 MG/3ML) 0.083% nebulizer solution 2.5 mg, 2.5 mg, Nebulization, Q2H PRN, Demetrios Loll, MD, 2.5 mg at 07/07/19 1137   ALPRAZolam (XANAX) tablet 0.5 mg, 0.5 mg, Oral, Daily PRN, Demetrios Loll, MD, 0.5 mg at 07/09/19 1106   [START ON 07/11/2019] aspirin chewable tablet 81 mg, 81 mg, Oral, Pre-Cath, Teodoro Spray, MD   aspirin EC tablet 81 mg, 81 mg, Oral, Daily, Demetrios Loll, MD, 81 mg at 07/10/19 6606   azithromycin (ZITHROMAX) tablet 500 mg, 500 mg, Oral, Daily, Tressia Miners, Radhika, MD, 500 mg at 07/09/19 2021    bisacodyl (DULCOLAX) EC tablet 5 mg, 5 mg, Oral, Daily PRN, Demetrios Loll, MD, 5 mg at 07/09/19 2025   budesonide (PULMICORT) nebulizer solution 0.5 mg, 0.5 mg, Nebulization, BID, Amarissa Koerner, MD, 0.5 mg at 07/10/19 0813   chlorhexidine (PERIDEX) 0.12 % solution 15 mL, 15 mL, Mouth Rinse, BID, Mayo, Pete Pelt, MD, 15 mL at 07/10/19 0901   cholecalciferol (VITAMIN D3) tablet 2,000 Units, 2,000 Units, Oral, Daily, Demetrios Loll, MD, 2,000 Units at 07/10/19 0902   cyclobenzaprine (FLEXERIL) tablet 7.5 mg, 7.5 mg, Oral, BID, Tressia Miners, Radhika, MD   digoxin (LANOXIN) tablet 0.25 mg, 0.25 mg, Oral, Daily, Lanney Gins, Lenoard Helbert, MD, 0.25 mg at 07/10/19 3016   diphenhydrAMINE (BENADRYL) injection 25 mg, 25 mg, Intravenous, QHS, Curly Mackowski, MD   enoxaparin (LOVENOX) injection 40 mg, 40 mg, Subcutaneous, Q12H, Demetrios Loll, MD, 40 mg at 07/10/19 0900   furosemide (LASIX) injection 20 mg, 20 mg, Intravenous, Q8H, Kalisetti, Radhika, MD, 20 mg at 07/10/19 1357   gabapentin (NEURONTIN) capsule 300 mg, 300 mg, Oral, QID, Demetrios Loll, MD, 300 mg at 07/10/19 1738   guaiFENesin (ROBITUSSIN) 100 MG/5ML solution 100 mg, 5 mL, Oral, Q4H PRN, Demetrios Loll, MD   HYDROcodone-acetaminophen (NORCO/VICODIN) 5-325 MG per tablet 1 tablet, 1 tablet, Oral, Q8H PRN, Gladstone Lighter, MD, 1 tablet at 07/10/19 1747   ipratropium-albuterol (DUONEB) 0.5-2.5 (3) MG/3ML nebulizer solution 3 mL, 3 mL, Nebulization, Q6H, Mayo, Pete Pelt, MD, 3 mL at 07/10/19 1431   levothyroxine (SYNTHROID) tablet 25 mcg, 25 mcg, Oral, QAC breakfast, Demetrios Loll, MD, 25 mcg at 07/10/19 0623   lidocaine (LIDODERM) 5 % 1 patch, 1 patch, Transdermal, Q24H, Kalisetti, Radhika, MD, 1 patch at 07/10/19 1208   MEDLINE mouth rinse, 15 mL, Mouth Rinse, q12n4p, Mayo, Pete Pelt, MD, 15 mL at 07/08/19 1600   methylPREDNISolone sodium succinate (SOLU-MEDROL) 125 mg/2 mL injection 60 mg, 60 mg, Intravenous, Q12H, Qunicy Higinbotham, MD, 60 mg at 07/10/19 1738    morphine 4 MG/ML injection 4 mg, 4 mg, Intravenous, Q4H PRN, Mayo, Pete Pelt, MD, 4 mg at 07/07/19 1202   nicotine (NICODERM CQ - dosed in mg/24 hours) patch 14 mg, 14 mg, Transdermal, Daily, Lanney Gins, Reve Crocket, MD   nitroGLYCERIN (NITROSTAT) SL tablet 0.4 mg, 0.4 mg, Sublingual, Q5 min PRN, Mayo, Pete Pelt, MD, 0.4 mg at 07/07/19 1220   ondansetron (ZOFRAN) tablet 4 mg, 4 mg, Oral, Q6H PRN **OR** ondansetron (  ZOFRAN) injection 4 mg, 4 mg, Intravenous, Q6H PRN, Demetrios Loll, MD   potassium chloride SA (K-DUR) CR tablet 20 mEq, 20 mEq, Oral, BID, Demetrios Loll, MD, 20 mEq at 07/10/19 3086   senna-docusate (Senokot-S) tablet 1 tablet, 1 tablet, Oral, QHS PRN, Demetrios Loll, MD   sertraline (ZOLOFT) tablet 200 mg, 200 mg, Oral, Daily, Demetrios Loll, MD, 200 mg at 07/10/19 5784   sodium chloride flush (NS) 0.9 % injection 3 mL, 3 mL, Intravenous, Once, Earleen Newport, MD   sodium chloride flush (NS) 0.9 % injection 3 mL, 3 mL, Intravenous, Q12H, Demetrios Loll, MD, 3 mL at 07/10/19 1209   sodium chloride flush (NS) 0.9 % injection 3 mL, 3 mL, Intravenous, PRN, Demetrios Loll, MD   [DISCONTINUED] fluticasone furoate-vilanterol (BREO ELLIPTA) 100-25 MCG/INH 1 puff, 1 puff, Inhalation, Daily, 1 puff at 07/08/19 0841 **OR** umeclidinium bromide (INCRUSE ELLIPTA) 62.5 MCG/INH 1 puff, 1 puff, Inhalation, Daily, Demetrios Loll, MD, 1 puff at 07/10/19 0902    ALLERGIES   Codeine and Meloxicam     REVIEW OF SYSTEMS    Review of Systems:  Gen:  Denies  fever, sweats, chills weigh loss  HEENT: Denies blurred vision, double vision, ear pain, eye pain, hearing loss, nose bleeds, sore throat Cardiac:  No dizziness, chest pain or heaviness, chest tightness,edema Resp:  Reports cough, sputum porduction, shortness of breath,wheezing,no hemoptysis,  Gi: Denies swallowing difficulty, stomach pain, nausea or vomiting, diarrhea, constipation, bowel incontinence Gu:  Denies bladder incontinence, burning urine Ext:    Denies Joint pain, stiffness or swelling Skin: Denies  skin rash, easy bruising or bleeding or hives Endoc:  Denies polyuria, polydipsia , polyphagia or weight change Psych:   Denies depression, insomnia or hallucinations   Other:  All other systems negative   VS: BP 111/61 (BP Location: Left Arm)    Pulse 81    Temp 98.4 F (36.9 C) (Oral)    Resp 17    Ht _0  (1.575 m)    Wt 109.1 kg    SpO2 94%    BMI 43.99 kg/m      PHYSICAL EXAM    GENERAL:NAD, no fevers, chills, no weakness no fatigue HEAD: Normocephalic, atraumatic.  EYES: Pupils equal, round, reactive to light. Extraocular muscles intact. No scleral icterus.  MOUTH: Moist mucosal membrane. Dentition intact. No abscess noted.  EAR, NOSE, THROAT: Clear without exudates. No external lesions.  NECK: Supple. No thyromegaly. No nodules. No JVD.  PULMONARY: b/l pan inspiratory/exp +wheezes CARDIOVASCULAR: S1 and S2. Regular rate and rhythm. No murmurs, rubs, or gallops. No edema. Pedal pulses 2+ bilaterally.  GASTROINTESTINAL: Soft, nontender, nondistended. No masses. Positive bowel sounds. No hepatosplenomegaly.  MUSCULOSKELETAL: No swelling, clubbing, or edema. Range of motion full in all extremities.  NEUROLOGIC: Cranial nerves II through XII are intact. No gross focal neurological deficits. Sensation intact. Reflexes intact.  SKIN: No ulceration, lesions, rashes, or cyanosis. Skin warm and dry. Turgor intact.  PSYCHIATRIC: Mood, affect within normal limits. The patient is awake, alert and oriented x 3. Insight, judgment intact.       IMAGING    Dg Chest 1 View  Result Date: 07/06/2019 CLINICAL DATA:  Shortness of breath, chest pain EXAM: CHEST  1 VIEW COMPARISON:  02/07/2018 FINDINGS: Cardiomegaly. Mild, diffuse bilateral interstitial pulmonary opacity. The visualized skeletal structures are unremarkable. IMPRESSION: Cardiomegaly with mild, diffuse bilateral interstitial pulmonary opacity, likely mild edema. No focal  airspace opacity. Electronically Signed   By: Dorna Bloom.D.  On: 07/06/2019 15:15   Ct Angio Chest Pe W Or Wo Contrast  Result Date: 07/07/2019 CLINICAL DATA:  Chest pain and worsening shortness of breath for 2 days. History of wheezing, hypertension, COPD, CHF, smoker. EXAM: CT ANGIOGRAPHY CHEST WITH CONTRAST TECHNIQUE: Multidetector CT imaging of the chest was performed using the standard protocol during bolus administration of intravenous contrast. Multiplanar CT image reconstructions and MIPs were obtained to evaluate the vascular anatomy. CONTRAST:  44m OMNIPAQUE IOHEXOL 350 MG/ML SOLN COMPARISON:  Chest CT dated 03/25/2019. FINDINGS: Cardiovascular: Some of the peripheral segmental and subsegmental pulmonary artery branches are difficult to definitively characterize due to patient breathing motion artifact, however, there is no pulmonary embolism identified within the main, lobar or segmental pulmonary arteries bilaterally. No thoracic aortic aneurysm or evidence of aortic dissection. Mild aortic atherosclerosis. Cardiomegaly. No pericardial effusion. Diffuse coronary artery calcifications, particularly dense within the LEFT anterior descending coronary artery. Mediastinum/Nodes: No mass or enlarged lymph nodes seen within the mediastinum or perihilar regions. Esophagus appears normal. Trachea appears normal. Lungs/Pleura: Mild bibasilar atelectasis/scarring. Mild centrilobular and paraseptal emphysema. Stable linear consolidations within the medial aspects of each lung suggesting chronic MAI. No evidence of acute pneumonia or pulmonary edema. Upper Abdomen: No acute findings on limited images of the upper abdomen. Previously described cysts within the liver, again too small to definitively characterize. Musculoskeletal: No acute or suspicious osseous finding. Mild degenerative spurring within the thoracic spine. Review of the MIP images confirms the above findings. IMPRESSION: 1. No pulmonary  embolism seen, with mild study limitations detailed above. 2. Cardiomegaly. No pericardial effusion. 3. Diffuse coronary artery calcifications, particularly dense within the LEFT anterior descending coronary artery. Recommend correlation with any possible associated cardiac symptoms. 4. Chronic/incidental lung findings detailed above. No evidence of active pneumonia or pulmonary edema. Aortic Atherosclerosis (ICD10-I70.0) and Emphysema (ICD10-J43.9). Electronically Signed   By: SFranki CabotM.D.   On: 07/07/2019 11:20    Sonographer Comments: Suboptimal apical window. IMPRESSIONS    1. Left ventricular ejection fraction, by visual estimation, is 60 to 65%. The left ventricle has normal function. Left ventricular septal wall thickness was normal. Normal left ventricular posterior wall thickness. There is no left ventricular  hypertrophy.  2. Global right ventricle has normal systolic function.The right ventricular size is mildly enlarged. No increase in right ventricular wall thickness.  3. Left atrial size was normal.  4. Right atrial size was mildly dilated.  5. The mitral valve is grossly normal. Trace mitral valve regurgitation.  6. The tricuspid valve is not well visualized. Tricuspid valve regurgitation is mild.  7. The aortic valve was not well visualized Aortic valve regurgitation is trivial by color flow Doppler.  8. The pulmonic valve was not well visualized. Pulmonic valve regurgitation is trivial by color flow Doppler.  9. Moderately elevated pulmonary artery systolic pressure. 10. The interatrial septum was not assessed.  FINDINGS  Left Ventricle: Left ventricular ejection fraction, by visual estimation, is 60 to 65%. The left ventricle has normal function. Left ventricular septal wall thickness was normal. Normal left ventricular posterior wall thickness. There is no left  ventricular hypertrophy.  Right Ventricle: The right ventricular size is mildly enlarged. No increase in  right ventricular wall thickness. Global RV systolic function is has normal systolic function. The tricuspid regurgitant velocity is 3.51 m/s, and with an assumed right atrial  pressure of 10 mmHg, the estimated right ventricular systolic pressure is moderately elevated at 59.4 mmHg.  Left Atrium: Left atrial size was normal in  size.  Right Atrium: Right atrial size was mildly dilated  Pericardium: There is no evidence of pericardial effusion.     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 -Agree with Solumedrol and DuoNebs -Adding MetaNeb with Saline for recruitment and BPH - IS at bedside  - minimize narcotics and encourage OOB as well as work with PT  - procalcitonin for antimicrobial guidance   -currently on Zithromax - no grossly appreciable infiltrate on CT chest to suggest PNA and WBC elevation likely from peripheral demargination -TTE with relatively preserved LV function -will add pulmicort due to continued wheezing -continue noctural BIPAP on home settings     Moderate pulmonary HTN   - will obtain BNP    - likely group 3 due to chronic pulmonary dz   - adding digoxin 250 po daily for now    - would ideally like to diurese more but pressure is borderline   - cardiology on case - appreciate input - plan for L&RHC   -pt to follow up with primary pulmonologist Dr Raul Del post d/c      Tobacco abuse    - counseling for smoking cessation    - transdermal nicotine patch 35m     Thank you for allowing me to participate in the care of this patient.   Patient/Family are satisfied with care plan and all questions have been answered.  This document was prepared using Dragon voice recognition software and may include unintentional dictation errors.     FOttie Glazier M.D.  [facility]ision of [facility]

## 2019-07-10 NOTE — Progress Notes (Addendum)
Patient Name: Theresa [name] Date of Encounter: 07/10/2019  Hospital Problem List     Active Problems:   Acute on chronic respiratory failure with hypoxia [facility])    Patient Profile     Patient with history of COPD oxygen dependent admitted with respiratory distress.  Improved with less wheezing this morning.  Has preserved LV function.  Estimated RV systolic pressure at 35.0 mmHg.  Ruled out for myocardial infarction.  Subjective   Less short of breath.  Inpatient Medications    . aspirin EC  81 mg Oral Daily  . azithromycin  500 mg Oral Daily  . budesonide (PULMICORT) nebulizer solution  0.5 mg Nebulization BID  . chlorhexidine  15 mL Mouth Rinse BID  . cholecalciferol  2,000 Units Oral Daily  . cyclobenzaprine  5 mg Oral BID  . digoxin  0.25 mg Oral Daily  . diphenhydrAMINE  25 mg Intravenous QHS  . enoxaparin (LOVENOX) injection  40 mg Subcutaneous Q12H  . furosemide  20 mg Intravenous Q8H  . gabapentin  300 mg Oral QID  . ipratropium-albuterol  3 mL Nebulization Q6H  . levothyroxine  25 mcg Oral QAC breakfast  . mouth rinse  15 mL Mouth Rinse q12n4p  . methylPREDNISolone (SOLU-MEDROL) injection  60 mg Intravenous Q12H  . nicotine  14 mg Transdermal Daily  . potassium chloride SA  20 mEq Oral BID  . sertraline  200 mg Oral Daily  . sodium chloride flush  3 mL Intravenous Once  . sodium chloride flush  3 mL Intravenous Q12H  . umeclidinium bromide  1 puff Inhalation Daily    Vital Signs    Vitals:   07/10/19 0323 07/10/19 0336 07/10/19 0624 07/10/19 0816  BP:  (!) 103/53 122/70 114/72  Pulse:  (!) 59 (!) 58 64  Resp:  20  17  Temp:  97.8 F (36.6 C)  97.7 F (36.5 C)  TempSrc:  Oral  Oral  SpO2:  97% 99% 95%  Weight: 109.1 kg     Height:        Intake/Output Summary (Last 24 hours) at 07/10/2019 0829 Last data filed at 07/10/2019 0037 Gross per 24 hour  Intake 850 ml  Output 1200 ml  Net -350 ml   Filed Weights   07/08/19 0408 07/09/19 0425 07/10/19  0323  Weight: 109 kg 109.1 kg 109.1 kg    Physical Exam    GEN: Well nourished, well developed, in no acute distress.  HEENT: normal.  Neck: Supple, no JVD, carotid bruits, or masses. Cardiac: RRR, no murmurs, rubs, or gallops. No clubbing, cyanosis, edema.  Radials/DP/PT 2+ and equal bilaterally.  Respiratory:  Respirations regular and unlabored, clear to auscultation bilaterally. GI: Soft, nontender, nondistended, BS + x 4. MS: no deformity or atrophy. Skin: warm and dry, no rash. Neuro:  Strength and sensation are intact. Psych: Normal affect.  Labs    CBC Recent Labs    07/08/19 0402  WBC 14.3*  HGB 14.4  HCT 48.1*  MCV 100.0  PLT 093   Basic Metabolic Panel Recent Labs    07/09/19 0441 07/10/19 0557  NA 141 143  K 4.7 4.8  CL 100 99  CO2 33* 37*  GLUCOSE 143* 92  BUN 33* 32*  CREATININE 0.79 0.83  CALCIUM 8.8* 8.9   Liver Function Tests No results for input(s): AST, ALT, ALKPHOS, BILITOT, PROT, ALBUMIN in the last 72 hours. No results for input(s): LIPASE, AMYLASE in the last 72 hours. Cardiac Enzymes No  results for input(s): CKTOTAL, CKMB, CKMBINDEX, TROPONINI in the last 72 hours. BNP Recent Labs    07/09/19 1632  BNP 108.0*   D-Dimer No results for input(s): DDIMER in the last 72 hours. Hemoglobin A1C No results for input(s): HGBA1C in the last 72 hours. Fasting Lipid Panel No results for input(s): CHOL, HDL, LDLCALC, TRIG, CHOLHDL, LDLDIRECT in the last 72 hours. Thyroid Function Tests No results for input(s): TSH, T4TOTAL, T3FREE, THYROIDAB in the last 72 hours.  Invalid input(s): FREET3  Telemetry    Sinus tachycardia  ECG    Sinus tachycardia with no ischemia  Radiology    Dg Chest 1 View  Result Date: 07/06/2019 CLINICAL DATA:  Shortness of breath, chest pain EXAM: CHEST  1 VIEW COMPARISON:  02/07/2018 FINDINGS: Cardiomegaly. Mild, diffuse bilateral interstitial pulmonary opacity. The visualized skeletal structures are  unremarkable. IMPRESSION: Cardiomegaly with mild, diffuse bilateral interstitial pulmonary opacity, likely mild edema. No focal airspace opacity. Electronically Signed   By: Eddie Candle M.D.   On: 07/06/2019 15:15   Ct Angio Chest Pe W Or Wo Contrast  Result Date: 07/07/2019 CLINICAL DATA:  Chest pain and worsening shortness of breath for 2 days. History of wheezing, hypertension, COPD, CHF, smoker. EXAM: CT ANGIOGRAPHY CHEST WITH CONTRAST TECHNIQUE: Multidetector CT imaging of the chest was performed using the standard protocol during bolus administration of intravenous contrast. Multiplanar CT image reconstructions and MIPs were obtained to evaluate the vascular anatomy. CONTRAST:  67m OMNIPAQUE IOHEXOL 350 MG/ML SOLN COMPARISON:  Chest CT dated 03/25/2019. FINDINGS: Cardiovascular: Some of the peripheral segmental and subsegmental pulmonary artery branches are difficult to definitively characterize due to patient breathing motion artifact, however, there is no pulmonary embolism identified within the main, lobar or segmental pulmonary arteries bilaterally. No thoracic aortic aneurysm or evidence of aortic dissection. Mild aortic atherosclerosis. Cardiomegaly. No pericardial effusion. Diffuse coronary artery calcifications, particularly dense within the LEFT anterior descending coronary artery. Mediastinum/Nodes: No mass or enlarged lymph nodes seen within the mediastinum or perihilar regions. Esophagus appears normal. Trachea appears normal. Lungs/Pleura: Mild bibasilar atelectasis/scarring. Mild centrilobular and paraseptal emphysema. Stable linear consolidations within the medial aspects of each lung suggesting chronic MAI. No evidence of acute pneumonia or pulmonary edema. Upper Abdomen: No acute findings on limited images of the upper abdomen. Previously described cysts within the liver, again too small to definitively characterize. Musculoskeletal: No acute or suspicious osseous finding. Mild  degenerative spurring within the thoracic spine. Review of the MIP images confirms the above findings. IMPRESSION: 1. No pulmonary embolism seen, with mild study limitations detailed above. 2. Cardiomegaly. No pericardial effusion. 3. Diffuse coronary artery calcifications, particularly dense within the LEFT anterior descending coronary artery. Recommend correlation with any possible associated cardiac symptoms. 4. Chronic/incidental lung findings detailed above. No evidence of active pneumonia or pulmonary edema. Aortic Atherosclerosis (ICD10-I70.0) and Emphysema (ICD10-J43.9). Electronically Signed   By: SFranki CabotM.D.   On: 07/07/2019 11:20    Assessment & Plan    Shortness of breath-etiology is likely multifactorial.  Has preserved LV function with evidence of at least moderately elevated right-sided pressures based on echo report.  Appreciate Dr. ATeodoro Kilinput.  Patient is significantly improved from a pulmonary standpoint this morning after the addition of digoxin and BiPAP as well as bronchodilators.  May need right heart cath to better evaluate pulmonary pressures to guide further therapy.  Need to stop smoking.  Chest pain-has ruled out for myocardial infarction but has multiple risk factors for coronary disease.  Consideration  for left heart cath as well to evaluate coronary anatomy.  Will schedule right and left heart cath for tomorrow per Dr. Clayborn Bigness.  Further recommendations after this is complete.  Signed, Javier Docker Kaveri Perras MD 07/10/2019, 8:29 AM  Pager: [phone]

## 2019-07-11 ENCOUNTER — Encounter
Admission: EM | Disposition: A | Discharge status: Home Health | Payer: Self-pay | Source: Home / Self Care | Attending: Internal Medicine

## 2019-07-11 ENCOUNTER — Encounter: Payer: Self-pay | Admitting: *Deleted

## 2019-07-11 HISTORY — PX: RIGHT/LEFT HEART CATH AND CORONARY ANGIOGRAPHY: CATH118266

## 2019-07-11 LAB — BASIC METABOLIC PANEL
Anion gap: 11 (ref 5–15)
BUN: 33 mg/dL — ABNORMAL HIGH (ref 8–23)
CO2: 35 mmol/L — ABNORMAL HIGH (ref 22–32)
Calcium: 9 mg/dL (ref 8.9–10.3)
Chloride: 95 mmol/L — ABNORMAL LOW (ref 98–111)
Creatinine, Ser: 0.79 mg/dL (ref 0.44–1.00)
GFR calc Af Amer: >60 mL/min (ref >60)
GFR calc non Af Amer: >60 mL/min (ref >60)
Glucose, Bld: 131 mg/dL — ABNORMAL HIGH (ref 70–99)
Potassium: 4.5 mmol/L (ref 3.5–5.1)
Sodium: 141 mmol/L (ref 135–145)

## 2019-07-11 SURGERY — RIGHT/LEFT HEART CATH AND CORONARY ANGIOGRAPHY
Anesthesia: Moderate Sedation

## 2019-07-11 MED ORDER — HEPARIN (PORCINE) IN NACL 1000-0.9 UT/500ML-% IV SOLN
INTRAVENOUS | Status: DC | PRN
  Administered 2019-07-11: 500 mL

## 2019-07-11 MED ORDER — SODIUM CHLORIDE 0.9% FLUSH: 3.0000 mL | Freq: Two times a day (BID) | INTRAVENOUS | Status: DC

## 2019-07-11 MED ORDER — MORPHINE SULFATE (PF) 2 MG/ML IV SOLN
INTRAVENOUS | Status: AC
  Filled 2019-07-11: qty 1

## 2019-07-11 MED ORDER — HEPARIN (PORCINE) IN NACL 1000-0.9 UT/500ML-% IV SOLN
INTRAVENOUS | Status: AC
  Filled 2019-07-11: qty 1000

## 2019-07-11 MED ORDER — MIDAZOLAM HCL 2 MG/2ML IJ SOLN
INTRAMUSCULAR | Status: AC
  Filled 2019-07-11: qty 2

## 2019-07-11 MED ORDER — FENTANYL CITRATE (PF) 100 MCG/2ML IJ SOLN
INTRAMUSCULAR | Status: AC
  Filled 2019-07-11: qty 2

## 2019-07-11 MED ORDER — LABETALOL HCL 5 MG/ML IV SOLN: 10.0000 mg | INTRAVENOUS | Status: AC | PRN

## 2019-07-11 MED ORDER — SODIUM CHLORIDE 0.9 % WEIGHT BASED INFUSION
1.0000 mL/kg/h | INTRAVENOUS | Status: AC
  Administered 2019-07-11: 1 mL/kg/h via INTRAVENOUS

## 2019-07-11 MED ORDER — ACETAMINOPHEN 325 MG PO TABS: 650.0000 mg | ORAL_TABLET | ORAL | Status: DC | PRN

## 2019-07-11 MED ORDER — SODIUM CHLORIDE 0.9% FLUSH: 3.0000 mL | INTRAVENOUS | Status: DC | PRN

## 2019-07-11 MED ORDER — SODIUM CHLORIDE 0.9 % IV SOLN: 250.0000 mL | INTRAVENOUS | Status: DC | PRN

## 2019-07-11 MED ORDER — ONDANSETRON HCL 4 MG/2ML IJ SOLN
INTRAMUSCULAR | Status: AC
  Administered 2019-07-11: 4 mg via INTRAVENOUS
  Filled 2019-07-11: qty 2

## 2019-07-11 MED ORDER — ONDANSETRON HCL 4 MG/2ML IJ SOLN
4.0000 mg | Freq: Four times a day (QID) | INTRAMUSCULAR | Status: DC | PRN
  Administered 2019-07-11: 15:00:00 4 mg via INTRAVENOUS

## 2019-07-11 MED ORDER — IOHEXOL 300 MG/ML  SOLN
INTRAMUSCULAR | Status: DC | PRN
  Administered 2019-07-11: 75 mL

## 2019-07-11 MED ORDER — HYDRALAZINE HCL 20 MG/ML IJ SOLN: 10.0000 mg | INTRAMUSCULAR | Status: AC | PRN

## 2019-07-11 SURGICAL SUPPLY — 14 items
CATH INFINITI 5FR ANG PIGTAIL (CATHETERS) ×2 IMPLANT
CATH INFINITI 5FR JL4 (CATHETERS) ×2 IMPLANT
CATH INFINITI JR4 5F (CATHETERS) ×2 IMPLANT
CATH SWANZ 7F THERMO (CATHETERS) ×2 IMPLANT
DEVICE CLOSURE MYNXGRIP 5F (Vascular Products) ×2 IMPLANT
DEVICE CLOSURE MYNXGRIP 6/7F (Vascular Products) ×2 IMPLANT
GUIDEWIRE EMER 3M J .025X150CM (WIRE) ×2 IMPLANT
KIT MANI 3VAL PERCEP (MISCELLANEOUS) ×2 IMPLANT
KIT RIGHT HEART (MISCELLANEOUS) ×2 IMPLANT
NEEDLE PERC 18GX7CM (NEEDLE) ×2 IMPLANT
PACK CARDIAC CATH (CUSTOM PROCEDURE TRAY) ×2 IMPLANT
SHEATH AVANTI 5FR X 11CM (SHEATH) ×2 IMPLANT
SHEATH AVANTI 7FRX11 (SHEATH) ×2 IMPLANT
WIRE GUIDERIGHT .035X150 (WIRE) ×2 IMPLANT

## 2019-07-11 NOTE — Progress Notes (Signed)
Pulmonary Medicine          Date: 07/11/2019,   MRN# [id number] Theresa [name] 10-05-1956     AdmissionWeight: 109.4 kg                 CurrentWeight: 111.9 kg      CHIEF COMPLAINT:   Acute respiratory distress   SUBJECTIVE   Breathing improved. Pt resting in bed comfortably  PAST MEDICAL HISTORY   Past Medical History:  Diagnosis Date   Acute postoperative pain 10/03/2017   Anxiety    BiPAP (biphasic positive airway pressure) dependence    at hs   Carpal tunnel syndrome    CHF (congestive heart failure) ([place])    1/18   CHF (congestive heart failure) (H[facility])    Chronic generalized abdominal pain    Chronic rhinitis    COPD (chronic obstructive pulmonary disease) (H[facility])    DDD (degenerative disc disease), cervical    DDD (degenerative disc disease), lumbosacral    Depression    Dyspnea    Edema    Flu    1/18   Gastritis    GERD (gastroesophageal reflux disease)    Hematuria    Hernia of abdominal wall 07/17/2018   Hernia, abdominal    Hypertension    Kidney stones    Low back pain    Lumbar radiculitis    Malodorous urine    Muscle weakness    Obesity    Oxygen dependent    Renal cyst    Sensory urge incontinence    Thyroid activity decreased    9/19   Tobacco abuse    Wheezing      SURGICAL HISTORY   Past Surgical History:  Procedure Laterality Date   ABDOMINAL HYSTERECTOMY     CARPAL TUNNEL RELEASE Left 2012   EPIGASTRIC HERNIA REPAIR N/A 08/13/2018   HERNIA REPAIR EPIGASTRIC ADULT; polypropylene mesh reinforcement.  Surgeon: Robert Bellow, MD;  Location: [facility];  Service: General;  Laterality: N/A;   RIGHT/LEFT HEART CATH AND CORONARY ANGIOGRAPHY N/A 07/11/2019   Procedure: RIGHT/LEFT HEART CATH AND CORONARY ANGIOGRAPHY;  Surgeon: Yolonda Kida, MD;  Location: [place] [facility];  Service: Cardiovascular;  Laterality: N/A;   TONSILLECTOMY     TUBAL LIGATION     UMBILICAL  HERNIA REPAIR N/A 08/13/2018   HERNIA REPAIR UMBILICAL ADULT; polypropylene mesh reinforcement Surgeon: Robert Bellow, MD;  Location: [facility];  Service: General;  Laterality: N/A;     FAMILY HISTORY   Family History  Problem Relation Age of Onset   Heart disease Mother    Stroke Mother    Coronary artery disease Mother    Lung cancer Sister    Cancer Sister    Breast cancer Sister    Alcohol abuse Father    Heart disease Father    Cancer Brother    Cancer Brother    Pneumonia Brother    Prostate cancer Neg Hx    Kidney cancer Neg Hx    Bladder Cancer Neg Hx      SOCIAL HISTORY   Social History   Tobacco Use   Smoking status: Current Every Day Smoker    Packs/day: 1.00    Years: 44.00    Pack years: 44.00    Types: Cigarettes   Smokeless tobacco: Never Used  Substance Use Topics   Alcohol use: No    Frequency: Never   Drug use: No     MEDICATIONS    Home  Medication:    Current Medication:  Current Facility-Administered Medications:    0.9 %  sodium chloride infusion, , Intravenous, Continuous, Mayo, Pete Pelt, MD   0.9% sodium chloride infusion, 1 mL/kg/hr, Intravenous, Continuous, Callwood, Dwayne D, MD   acetaminophen (TYLENOL) tablet 650 mg, 650 mg, Oral, Q6H PRN, 650 mg at 07/07/19 0617 **OR** acetaminophen (TYLENOL) suppository 650 mg, 650 mg, Rectal, Q6H PRN, Demetrios Loll, MD   acetaminophen (TYLENOL) tablet 650 mg, 650 mg, Oral, Q4H PRN, Callwood, Dwayne D, MD   albuterol (PROVENTIL) (2.5 MG/3ML) 0.083% nebulizer solution 2.5 mg, 2.5 mg, Nebulization, Q2H PRN, Demetrios Loll, MD, 2.5 mg at 07/07/19 1137   ALPRAZolam Duanne Moron) tablet 0.5 mg, 0.5 mg, Oral, Daily PRN, Demetrios Loll, MD, 0.5 mg at 07/11/19 1614   aspirin EC tablet 81 mg, 81 mg, Oral, Daily, Demetrios Loll, MD, 81 mg at 07/10/19 2426   bisacodyl (DULCOLAX) EC tablet 5 mg, 5 mg, Oral, Daily PRN, Demetrios Loll, MD, 5 mg at 07/11/19 1614   budesonide (PULMICORT) nebulizer  solution 0.5 mg, 0.5 mg, Nebulization, BID, Lanney Gins, Tirza Senteno, MD, 0.5 mg at 07/11/19 0751   chlorhexidine (PERIDEX) 0.12 % solution 15 mL, 15 mL, Mouth Rinse, BID, Mayo, Pete Pelt, MD, 15 mL at 07/10/19 2222   cholecalciferol (VITAMIN D3) tablet 2,000 Units, 2,000 Units, Oral, Daily, Demetrios Loll, MD, 2,000 Units at 07/11/19 1559   cyclobenzaprine (FLEXERIL) tablet 7.5 mg, 7.5 mg, Oral, BID, Tressia Miners, Radhika, MD, 7.5 mg at 07/11/19 1600   digoxin (LANOXIN) tablet 0.25 mg, 0.25 mg, Oral, Daily, Lanney Gins, Tayja Manzer, MD, 0.25 mg at 07/11/19 1600   diphenhydrAMINE (BENADRYL) injection 25 mg, 25 mg, Intravenous, QHS, Chlora Mcbain, MD   enoxaparin (LOVENOX) injection 40 mg, 40 mg, Subcutaneous, Q12H, Demetrios Loll, MD, 40 mg at 07/10/19 2220   furosemide (LASIX) injection 20 mg, 20 mg, Intravenous, Q8H, Kalisetti, Radhika, MD, 20 mg at 07/11/19 1614   gabapentin (NEURONTIN) capsule 300 mg, 300 mg, Oral, QID, Demetrios Loll, MD, 300 mg at 07/11/19 1559   guaiFENesin (ROBITUSSIN) 100 MG/5ML solution 100 mg, 5 mL, Oral, Q4H PRN, Demetrios Loll, MD   hydrALAZINE (APRESOLINE) injection 10 mg, 10 mg, Intravenous, Q20 Min PRN, Callwood, Dwayne D, MD   HYDROcodone-acetaminophen (NORCO/VICODIN) 5-325 MG per tablet 1 tablet, 1 tablet, Oral, Q8H PRN, Gladstone Lighter, MD, 1 tablet at 07/10/19 1747   ipratropium-albuterol (DUONEB) 0.5-2.5 (3) MG/3ML nebulizer solution 3 mL, 3 mL, Nebulization, Q6H, Mayo, Pete Pelt, MD, 3 mL at 07/11/19 0751   labetalol (NORMODYNE) injection 10 mg, 10 mg, Intravenous, Q10 min PRN, Callwood, Dwayne D, MD   levothyroxine (SYNTHROID) tablet 25 mcg, 25 mcg, Oral, QAC breakfast, Demetrios Loll, MD, 25 mcg at 07/11/19 0626   lidocaine (LIDODERM) 5 % 1 patch, 1 patch, Transdermal, Q24H, Gladstone Lighter, MD, 1 patch at 07/11/19 1605   MEDLINE mouth rinse, 15 mL, Mouth Rinse, q12n4p, Mayo, Pete Pelt, MD, 15 mL at 07/08/19 1600   methylPREDNISolone sodium succinate (SOLU-MEDROL) 125 mg/2  mL injection 60 mg, 60 mg, Intravenous, Q12H, Rasha Ibe, MD, 60 mg at 07/11/19 8341   morphine 2 MG/ML injection, , , ,    morphine 4 MG/ML injection 4 mg, 4 mg, Intravenous, Q4H PRN, Mayo, Pete Pelt, MD, 2 mg at 07/11/19 1423   nicotine (NICODERM CQ - dosed in mg/24 hours) patch 14 mg, 14 mg, Transdermal, Daily, Lanney Gins, Oaklan Persons, MD   nitroGLYCERIN (NITROSTAT) SL tablet 0.4 mg, 0.4 mg, Sublingual, Q5 min PRN, Mayo, Pete Pelt, MD, 0.4 mg at 07/07/19  1220   ondansetron (ZOFRAN) tablet 4 mg, 4 mg, Oral, Q6H PRN **OR** ondansetron (ZOFRAN) injection 4 mg, 4 mg, Intravenous, Q6H PRN, Demetrios Loll, MD   ondansetron [facility]) injection 4 mg, 4 mg, Intravenous, Q6H PRN, Callwood, Dwayne D, MD, 4 mg at 07/11/19 1435   potassium chloride SA (K-DUR) CR tablet 20 mEq, 20 mEq, Oral, BID, Demetrios Loll, MD, 20 mEq at 07/11/19 1614   senna-docusate (Senokot-S) tablet 1 tablet, 1 tablet, Oral, QHS PRN, Demetrios Loll, MD   sertraline (ZOLOFT) tablet 200 mg, 200 mg, Oral, Daily, Demetrios Loll, MD, 200 mg at 07/11/19 1559   sodium chloride flush (NS) 0.9 % injection 3 mL, 3 mL, Intravenous, Once, Earleen Newport, MD   [DISCONTINUED] fluticasone furoate-vilanterol (BREO ELLIPTA) 100-25 MCG/INH 1 puff, 1 puff, Inhalation, Daily, 1 puff at 07/08/19 0841 **OR** umeclidinium bromide (INCRUSE ELLIPTA) 62.5 MCG/INH 1 puff, 1 puff, Inhalation, Daily, Demetrios Loll, MD, 1 puff at 07/11/19 [phone]    ALLERGIES   Codeine and Meloxicam     REVIEW OF SYSTEMS    Review of Systems:  Gen:  Denies  fever, sweats, chills weigh loss  HEENT: Denies blurred vision, double vision, ear pain, eye pain, hearing loss, nose bleeds, sore throat Cardiac:  No dizziness, chest pain or heaviness, chest tightness,edema Resp:  Reports cough, sputum porduction, shortness of breath,wheezing,no hemoptysis,  Gi: Denies swallowing difficulty, stomach pain, nausea or vomiting, diarrhea, constipation, bowel incontinence Gu:  Denies bladder  incontinence, burning urine Ext:   Denies Joint pain, stiffness or swelling Skin: Denies  skin rash, easy bruising or bleeding or hives Endoc:  Denies polyuria, polydipsia , polyphagia or weight change Psych:   Denies depression, insomnia or hallucinations   Other:  All other systems negative   VS: BP 111/67 (BP Location: Left Arm)    Pulse 70    Temp 98.2 F (36.8 C) (Oral)    Resp 18    Ht _0  (1.575 m)    Wt 111.9 kg    SpO2 92%    BMI 45.12 kg/m      PHYSICAL EXAM    GENERAL:NAD, no fevers, chills, no weakness no fatigue HEAD: Normocephalic, atraumatic.  EYES: Pupils equal, round, reactive to light. Extraocular muscles intact. No scleral icterus.  MOUTH: Moist mucosal membrane. Dentition intact. No abscess noted.  EAR, NOSE, THROAT: Clear without exudates. No external lesions.  NECK: Supple. No thyromegaly. No nodules. No JVD.  PULMONARY: b/l pan inspiratory/exp +wheezes CARDIOVASCULAR: S1 and S2. Regular rate and rhythm. No murmurs, rubs, or gallops. No edema. Pedal pulses 2+ bilaterally.  GASTROINTESTINAL: Soft, nontender, nondistended. No masses. Positive bowel sounds. No hepatosplenomegaly.  MUSCULOSKELETAL: No swelling, clubbing, or edema. Range of motion full in all extremities.  NEUROLOGIC: Cranial nerves II through XII are intact. No gross focal neurological deficits. Sensation intact. Reflexes intact.  SKIN: No ulceration, lesions, rashes, or cyanosis. Skin warm and dry. Turgor intact.  PSYCHIATRIC: Mood, affect within normal limits. The patient is awake, alert and oriented x 3. Insight, judgment intact.       IMAGING    Dg Chest 1 View  Result Date: 07/06/2019 CLINICAL DATA:  Shortness of breath, chest pain EXAM: CHEST  1 VIEW COMPARISON:  02/07/2018 FINDINGS: Cardiomegaly. Mild, diffuse bilateral interstitial pulmonary opacity. The visualized skeletal structures are unremarkable. IMPRESSION: Cardiomegaly with mild, diffuse bilateral interstitial pulmonary  opacity, likely mild edema. No focal airspace opacity. Electronically Signed   By: Eddie Candle M.D.   On: 07/06/2019 15:15  Ct Angio Chest Pe W Or Wo Contrast  Result Date: 07/07/2019 CLINICAL DATA:  Chest pain and worsening shortness of breath for 2 days. History of wheezing, hypertension, COPD, CHF, smoker. EXAM: CT ANGIOGRAPHY CHEST WITH CONTRAST TECHNIQUE: Multidetector CT imaging of the chest was performed using the standard protocol during bolus administration of intravenous contrast. Multiplanar CT image reconstructions and MIPs were obtained to evaluate the vascular anatomy. CONTRAST:  72m OMNIPAQUE IOHEXOL 350 MG/ML SOLN COMPARISON:  Chest CT dated 03/25/2019. FINDINGS: Cardiovascular: Some of the peripheral segmental and subsegmental pulmonary artery branches are difficult to definitively characterize due to patient breathing motion artifact, however, there is no pulmonary embolism identified within the main, lobar or segmental pulmonary arteries bilaterally. No thoracic aortic aneurysm or evidence of aortic dissection. Mild aortic atherosclerosis. Cardiomegaly. No pericardial effusion. Diffuse coronary artery calcifications, particularly dense within the LEFT anterior descending coronary artery. Mediastinum/Nodes: No mass or enlarged lymph nodes seen within the mediastinum or perihilar regions. Esophagus appears normal. Trachea appears normal. Lungs/Pleura: Mild bibasilar atelectasis/scarring. Mild centrilobular and paraseptal emphysema. Stable linear consolidations within the medial aspects of each lung suggesting chronic MAI. No evidence of acute pneumonia or pulmonary edema. Upper Abdomen: No acute findings on limited images of the upper abdomen. Previously described cysts within the liver, again too small to definitively characterize. Musculoskeletal: No acute or suspicious osseous finding. Mild degenerative spurring within the thoracic spine. Review of the MIP images confirms the above  findings. IMPRESSION: 1. No pulmonary embolism seen, with mild study limitations detailed above. 2. Cardiomegaly. No pericardial effusion. 3. Diffuse coronary artery calcifications, particularly dense within the LEFT anterior descending coronary artery. Recommend correlation with any possible associated cardiac symptoms. 4. Chronic/incidental lung findings detailed above. No evidence of active pneumonia or pulmonary edema. Aortic Atherosclerosis (ICD10-I70.0) and Emphysema (ICD10-J43.9). Electronically Signed   By: SFranki CabotM.D.   On: 07/07/2019 11:20    Sonographer Comments: Suboptimal apical window. IMPRESSIONS    1. Left ventricular ejection fraction, by visual estimation, is 60 to 65%. The left ventricle has normal function. Left ventricular septal wall thickness was normal. Normal left ventricular posterior wall thickness. There is no left ventricular  hypertrophy.  2. Global right ventricle has normal systolic function.The right ventricular size is mildly enlarged. No increase in right ventricular wall thickness.  3. Left atrial size was normal.  4. Right atrial size was mildly dilated.  5. The mitral valve is grossly normal. Trace mitral valve regurgitation.  6. The tricuspid valve is not well visualized. Tricuspid valve regurgitation is mild.  7. The aortic valve was not well visualized Aortic valve regurgitation is trivial by color flow Doppler.  8. The pulmonic valve was not well visualized. Pulmonic valve regurgitation is trivial by color flow Doppler.  9. Moderately elevated pulmonary artery systolic pressure. 10. The interatrial septum was not assessed.  FINDINGS  Left Ventricle: Left ventricular ejection fraction, by visual estimation, is 60 to 65%. The left ventricle has normal function. Left ventricular septal wall thickness was normal. Normal left ventricular posterior wall thickness. There is no left  ventricular hypertrophy.  Right Ventricle: The right ventricular  size is mildly enlarged. No increase in right ventricular wall thickness. Global RV systolic function is has normal systolic function. The tricuspid regurgitant velocity is 3.51 m/s, and with an assumed right atrial  pressure of 10 mmHg, the estimated right ventricular systolic pressure is moderately elevated at 59.4 mmHg.  Left Atrium: Left atrial size was normal in size.  Right Atrium: Right  atrial size was mildly dilated  Pericardium: There is no evidence of pericardial effusion.     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 -Agree with Solumedrol and DuoNebs -Adding MetaNeb with Saline for recruitment and BPH - IS at bedside  - minimize narcotics and encourage OOB as well as work with PT  - procalcitonin for antimicrobial guidance   -currently on Zithromax - no grossly appreciable infiltrate on CT chest to suggest PNA and WBC elevation likely from peripheral demargination -TTE with relatively preserved LV function -will add pulmicort due to continued wheezing -continue noctural BIPAP on home settings     Moderate pulmonary HTN   - will obtain BNP    - likely group 3 due to chronic pulmonary dz   - adding digoxin 250 po daily for now    - would ideally like to diurese more but pressure is borderline   - cardiology on case - appreciate input - plan for L&RHC   -pt to follow up with primary pulmonologist Dr Raul Del post d/c      Tobacco abuse    - counseling for smoking cessation    - transdermal nicotine patch 89m     Thank you for allowing me to participate in the care of this patient.   Patient/Family are satisfied with care plan and all questions have been answered.  This document was prepared using Dragon voice recognition software and may include unintentional dictation errors.     FOttie Glazier M.D.  [place] of [place]

## 2019-07-11 NOTE — Plan of Care (Signed)
  Problem: Education: Goal: Knowledge of General Education information will improve Description: Including pain rating scale, medication(s)/side effects and non-pharmacologic comfort measures Outcome: Progressing   Problem: Health Behavior/Discharge Planning: Goal: Ability to manage health-related needs will improve Outcome: Progressing   Problem: Clinical Measurements: Goal: Respiratory complications will improve Outcome: Progressing   Problem: Coping: Goal: Level of anxiety will decrease Outcome: Progressing

## 2019-07-11 NOTE — Progress Notes (Signed)
PT Cancellation Note  Patient Details Name: Theresa [name] MRN: [id number] DOB: August 20, 1956   Cancelled Treatment:    Reason Eval/Treat Not Completed: Patient at procedure or test/unavailable Pt is not in room due to cardiac cath insertion this am. PT will return at a later time.  Theresa [name], SPT  Theresa [name] 07/11/2019, 1:06 PM

## 2019-07-11 NOTE — Plan of Care (Signed)
  Problem: Clinical Measurements: Goal: Respiratory complications will improve Outcome: Progressing   Problem: Coping: Goal: Level of anxiety will decrease Outcome: Progressing   Problem: Elimination: Goal: Will not experience complications related to urinary retention Outcome: Progressing   Problem: Pain Managment: Goal: General experience of comfort will improve Outcome: Progressing   Problem: Safety: Goal: Ability to remain free from injury will improve Outcome: Progressing   Problem: Skin Integrity: Goal: Risk for impaired skin integrity will decrease Outcome: Progressing   Problem: Nutrition: Goal: Adequate nutrition will be maintained Outcome: Completed/Met

## 2019-07-11 NOTE — Progress Notes (Signed)
[facility] [place] NAME: Theresa [name]    MR#:  [id number]  DATE OF BIRTH:  1955-12-27  SUBJECTIVE:  CHIEF COMPLAINT:   Chief Complaint  Patient presents with  . Chest Pain  . Shortness of Breath   -Remains on 3 L oxygen.  Wheezing is much improved. -For right heart cath and left heart cath today  REVIEW OF SYSTEMS:  Review of Systems  Constitutional: Positive for malaise/fatigue. Negative for chills and fever.  HENT: Negative for congestion, ear discharge, hearing loss and nosebleeds.   Respiratory: Positive for shortness of breath and wheezing. Negative for cough.   Cardiovascular: Positive for orthopnea and leg swelling. Negative for chest pain and palpitations.  Gastrointestinal: Negative for abdominal pain, constipation, diarrhea, nausea and vomiting.  Genitourinary: Negative for dysuria.  Musculoskeletal: Positive for back pain and myalgias.  Neurological: Negative for dizziness, focal weakness, seizures, weakness and headaches.  Psychiatric/Behavioral: Negative for depression.    DRUG ALLERGIES:   Allergies  Allergen Reactions  . Codeine Nausea And Vomiting  . Meloxicam Other (See Comments)    Stomach pain    VITALS:  Blood pressure 132/77, pulse 77, temperature 98.4 F (36.9 C), temperature source Oral, resp. rate 17, height _0  (1.575 m), weight 111.9 kg, SpO2 93 %.  PHYSICAL EXAMINATION:  Physical Exam   GENERAL:  63 y.o.-year-old morbidly obese patient lying in the bed, not in any acute distress  EYES: Pupils equal, round, reactive to light and accommodation. No scleral icterus. Extraocular muscles intact.  HEENT: Head atraumatic, normocephalic. Oropharynx and nasopharynx clear.  NECK:  Supple, no jugular venous distention. No thyroid enlargement, no tenderness.  LUNGS: Improved expiratory wheezing all over the lung fields, no crackles or rales.  Not using accessory muscles to breathe on exertion.Marland Kitchen   CARDIOVASCULAR: S1, S2 normal. No  rubs, or gallops.  2/6 systolic murmur is present ABDOMEN: Soft, nontender, nondistended. Bowel sounds present. No organomegaly or mass.  EXTREMITIES: No pedal edema, cyanosis, or clubbing.  NEUROLOGIC: Cranial nerves II through XII are intact. Muscle strength equal in all extremities. Sensation intact. Gait not checked.  Global weakness noted PSYCHIATRIC: The patient is alert and oriented x 3.  SKIN: No obvious rash, lesion, or ulcer.    LABORATORY PANEL:   CBC Recent Labs  Lab 07/08/19 0402  WBC 14.3*  HGB 14.4  HCT 48.1*  PLT 201   ------------------------------------------------------------------------------------------------------------------  Chemistries  Recent Labs  Lab 07/06/19 1452  07/11/19 0406  NA 143   < > 141  K 3.6   < > 4.5  CL 104   < > 95*  CO2 28   < > 35*  GLUCOSE 154*   < > 131*  BUN 19   < > 33*  CREATININE 0.79   < > 0.79  CALCIUM 9.2   < > 9.0  MG 2.1  --   --    < > = values in this interval not displayed.   ------------------------------------------------------------------------------------------------------------------  Cardiac Enzymes No results for input(s): TROPONINI in the last 168 hours. ------------------------------------------------------------------------------------------------------------------  RADIOLOGY:  No results found.  EKG:   Orders placed or performed during the hospital encounter of 07/06/19  . EKG 12-Lead  . EKG 12-Lead  . ED EKG  . ED EKG  . EKG 12-Lead  . EKG 12-Lead  . EKG - 12 lead  . EKG - 12 lead    ASSESSMENT AND PLAN:   63 y/o Female with PMH  significant for chronic respiratory failure secondary to COPD on 2.5 L home oxygen, sleep apnea on CPAP, obesity, GERD, low back pain, hypertension, diastolic CHF presents to hospital secondary to worsening shortness of breath for almost 1 week.  1.  Acute on chronic respiratory failure-secondary to COPD and CHF exacerbation  -Improved oxygen requirements, on 3 L O2 now which is close to her home O2 requirements. -Appreciate cardiology consult. -Continue on IV Lasix, strict input and output monitoring -on IV steroids, inhalers and nebulizers-  Using BiPAP at bedtime as well. -Appreciate pulmonary consult, nebs have been adjusted, patient's breathing is much improved today. -On oral Zithromax- stop after 5 days -CT angiogram negative for pulmonary embolism  2.  Acute on chronic diastolic heart failure-echo with EF of 60 to 65%. -Appreciate cardiology consult.  Continue IV Lasix.  Echo with moderately elevated pulmonary artery pressures. -Digoxin added. -Continue cardiac medications.  Hold lisinopril and bisoprolol due to hypotension.  3.  Depression-on Zoloft  4.  Hypothyroidism-Synthroid-  5.  DVT prophylaxis-Lovenox  6.  CAD-patient has chronic angina and musculoskeletal chest pain.  Blood pressure dropped significantly with 1 dose of sublingual nitro.  So will hold nitro.  - Troponins are negative.  Appreciate cardiology input. -Has significant coronary artery disease on CT angiogram but no acute symptoms. -Plan for right heart cath and left heart cath today  7.  Low back pain-added lidocaine patch.   Independent at baseline, lives at home with husband Husband at bedside.  Physical therapy consulted  Anticipate discharge in 1 to 2 days   All the records are reviewed and case discussed with Care Management/Social Workerr. Management plans discussed with the patient, family and they are in agreement.  CODE STATUS: Full code  TOTAL TIME TAKING CARE OF THIS PATIENT: 37 minutes.   POSSIBLE D/C IN 2-3 DAYS, DEPENDING ON CLINICAL CONDITION.   Gladstone Lighter M.D on 07/11/2019 at 1:06 PM  Between 7am to 6pm - Pager - [phone]  After 6pm go to [email or web address] - password [email or web address] [facility]  Office  [phone]  CC: Primary care physician; Chrismon, Vickki Muff, PA

## 2019-07-12 MED ORDER — HYDROCODONE-ACETAMINOPHEN 5-325 MG PO TABS
1.0000 | ORAL_TABLET | Freq: Four times a day (QID) | ORAL | Status: DC | PRN
  Administered 2019-07-12 – 2019-07-13 (×3): 1 via ORAL
  Filled 2019-07-12 (×3): qty 1

## 2019-07-12 MED ORDER — POLYETHYLENE GLYCOL 3350 17 G PO PACK
17.0000 g | PACK | Freq: Two times a day (BID) | ORAL | Status: DC
  Administered 2019-07-12 – 2019-07-13 (×2): 17 g via ORAL
  Filled 2019-07-12 (×2): qty 1

## 2019-07-12 MED ORDER — SENNOSIDES-DOCUSATE SODIUM 8.6-50 MG PO TABS
1.0000 | ORAL_TABLET | Freq: Two times a day (BID) | ORAL | Status: DC
  Administered 2019-07-12 – 2019-07-13 (×3): 1 via ORAL
  Filled 2019-07-12 (×3): qty 1

## 2019-07-12 MED ORDER — LACTULOSE 10 GM/15ML PO SOLN
20.0000 g | Freq: Two times a day (BID) | ORAL | Status: DC | PRN
  Administered 2019-07-13: 20 g via ORAL
  Filled 2019-07-12: qty 30

## 2019-07-12 MED ORDER — POLYETHYLENE GLYCOL 3350 17 G PO PACK
17.0000 g | PACK | Freq: Every day | ORAL | Status: DC
  Administered 2019-07-12: 17 g via ORAL

## 2019-07-12 NOTE — Progress Notes (Deleted)
Patient ID: Theresa [name], female    DOB: Aug 13, 1956, 63 y.o.   MRN: [id number]  HPI [name] is a 63 y/o female with a history of chronic low back pain, HTN, depression, COPD on long-term oxygen, anxiety, carpal tunnel syndrome, long-standing tobacco use and chronic heart failure.  Echo report from 07/07/2019 reviewed and showed an EF of 60-65% along with mild TR, trace MR, trivial AR and moderately elevated PA pressure. Echo done 11/03/15 but unable to access results. Echo done on 02/19/15 showed an EF of 50% without valvular stenosis. Recent PFT's done on 01/18/16  Admitted 07/06/2019 due to            Pulmonology and cardiology consults were obtained. Discharged after   She presents today for a follow-up visit with a chief complaint of   Past Medical History:  Diagnosis Date  . Acute postoperative pain 10/03/2017  . Anxiety   . BiPAP (biphasic positive airway pressure) dependence    at hs  . Carpal tunnel syndrome   . CHF (congestive heart failure) ([place])    1/18  . CHF (congestive heart failure) ([place])   . Chronic generalized abdominal pain   . Chronic rhinitis   . COPD (chronic obstructive pulmonary disease) ([place])   . DDD (degenerative disc disease), cervical   . DDD (degenerative disc disease), lumbosacral   . Depression   . Dyspnea   . Edema   . Flu    1/18  . Gastritis   . GERD (gastroesophageal reflux disease)   . Hematuria   . Hernia of abdominal wall 07/17/2018  . Hernia, abdominal   . Hypertension   . Kidney stones   . Low back pain   . Lumbar radiculitis   . Malodorous urine   . Muscle weakness   . Obesity   . Oxygen dependent   . Renal cyst   . Sensory urge incontinence   . Thyroid activity decreased    9/19  . Tobacco abuse   . Wheezing    Past Surgical History:  Procedure Laterality Date  . ABDOMINAL HYSTERECTOMY    . CARPAL TUNNEL RELEASE Left 2012  . EPIGASTRIC HERNIA REPAIR N/A 08/13/2018   HERNIA REPAIR EPIGASTRIC ADULT; polypropylene mesh  reinforcement.  Surgeon: Robert Bellow, MD;  Location: [facility];  Service: General;  Laterality: N/A;  . RIGHT/LEFT HEART CATH AND CORONARY ANGIOGRAPHY N/A 07/11/2019   Procedure: RIGHT/LEFT HEART CATH AND CORONARY ANGIOGRAPHY;  Surgeon: Yolonda Kida, MD;  Location: [facility];  Service: Cardiovascular;  Laterality: N/A;  . TONSILLECTOMY    . TUBAL LIGATION    . UMBILICAL HERNIA REPAIR N/A 08/13/2018   HERNIA REPAIR UMBILICAL ADULT; polypropylene mesh reinforcement Surgeon: Robert Bellow, MD;  Location: [facility];  Service: General;  Laterality: N/A;   Family History  Problem Relation Age of Onset  . Heart disease Mother   . Stroke Mother   . Coronary artery disease Mother   . Lung cancer Sister   . Cancer Sister   . Breast cancer Sister   . Alcohol abuse Father   . Heart disease Father   . Cancer Brother   . Cancer Brother   . Pneumonia Brother   . Prostate cancer Neg Hx   . Kidney cancer Neg Hx   . Bladder Cancer Neg Hx    Social History   Tobacco Use  . Smoking status: Current Every Day Smoker    Packs/day: 1.00    Years: 44.00  Pack years: 44.00    Types: Cigarettes  . Smokeless tobacco: Never Used  Substance Use Topics  . Alcohol use: No    Frequency: Never   Allergies  Allergen Reactions  . Codeine Nausea And Vomiting  . Meloxicam Other (See Comments)    Stomach pain     Review of Systems  Constitutional: Positive for fatigue. Negative for appetite change.  HENT: Negative for congestion, postnasal drip and sore throat.   Eyes: Negative.   Respiratory: Positive for cough and shortness of breath. Negative for chest tightness and wheezing.   Cardiovascular: Negative for chest pain, palpitations and leg swelling.  Gastrointestinal: Negative for abdominal distention, abdominal pain and nausea.  Endocrine: Negative.   Genitourinary: Negative.   Musculoskeletal: Positive for back pain. Negative for neck pain.  Skin: Negative.    Allergic/Immunologic: Negative.   Neurological: Negative for dizziness and light-headedness.  Hematological: Negative for adenopathy. Does not bruise/bleed easily.  Psychiatric/Behavioral: Positive for dysphoric mood and sleep disturbance (wearing oxygen & bipap at night). Negative for agitation and suicidal ideas. The patient is nervous/anxious.      Physical Exam  Constitutional: She is oriented to person, place, and time. She appears well-developed and well-nourished.  HENT:  Head: Normocephalic and atraumatic.  Neck: Normal range of motion. Neck supple. No JVD present.  Cardiovascular: Normal rate and regular rhythm.  Pulmonary/Chest: Effort normal. She has wheezes in the right lower field. She has no rales.  Abdominal: Soft. She exhibits no distension. There is no abdominal tenderness.  Musculoskeletal:        General: No tenderness or edema.  Neurological: She is alert and oriented to person, place, and time.  Skin: Skin is warm and dry.  Psychiatric: Her behavior is normal. Thought content normal. Her mood appears anxious. She does not exhibit a depressed mood.  Nursing note and vitals reviewed.     Assessment & Plan:  1: Chronic heart failure with preserved ejection fraction- - NYHA class III - euvolemic today - continues to weigh daily at home. Reminded to call for an overnight weight gain of >2 pounds or a weekly weight gain of >5 pounds - weight 242.2 pounds from last visit here 6 weeks ago  - monitor sodium content carefully - saw cardiologist Clayborn Bigness) 04/24/2019  - BNP 07/09/2019 was 108.0  2: HTN- - BP  - saw PCP (Chrismon) 03/18/2019 - BMP from                  reviewed and showed sodium 140, potassium 3.9 and GFR >60  3: COPD- - wearing bipap nightly - oxygen at 2.5L at bedtime and then as needed during the day - last saw pulmonology Raul Del) on 02/05/2019   4: Tobacco use- - alternates between the lozenges, gum and actual cigarettes - removes herself  from the oxygen when smoking - complete cessation discussed for 3 minutes with her  Patient did not bring her medications nor a list. Each medication was verbally reviewed with the patient and she was encouraged to bring the bottles to every visit to confirm accuracy of list.

## 2019-07-12 NOTE — Care Management Important Message (Signed)
Important Message  Patient Details  Name: Theresa [name] MRN: [id number] Date of Birth: 1956/06/26   Medicare Important Message Given:  Yes     [name] 07/12/2019, 11:43 AM

## 2019-07-12 NOTE — Progress Notes (Signed)
Patient Name: Theresa [name] Date of Encounter: 07/12/2019  Hospital Problem List     Active Problems:   Acute on chronic respiratory failure with hypoxia [facility])    Patient Profile     63 year old female with history of tobacco abuse and reactive airway disease admitted with increasing shortness of breath and bronchospasm.  BNP 108.  Echo showed normal LV function with mild diastolic dysfunction.  Pulmonary pressures estimated 59.4 mmHg.  Right left heart cath completed yesterday.  Right heart pressures were mildly elevated around 40.  No significant coronary disease.  Subjective   Less short of breath  Inpatient Medications    . aspirin EC  81 mg Oral Daily  . budesonide (PULMICORT) nebulizer solution  0.5 mg Nebulization BID  . chlorhexidine  15 mL Mouth Rinse BID  . cholecalciferol  2,000 Units Oral Daily  . cyclobenzaprine  7.5 mg Oral BID  . digoxin  0.25 mg Oral Daily  . diphenhydrAMINE  25 mg Intravenous QHS  . enoxaparin (LOVENOX) injection  40 mg Subcutaneous Q12H  . furosemide  20 mg Intravenous Q8H  . gabapentin  300 mg Oral QID  . ipratropium-albuterol  3 mL Nebulization Q6H  . levothyroxine  25 mcg Oral QAC breakfast  . lidocaine  1 patch Transdermal Q24H  . mouth rinse  15 mL Mouth Rinse q12n4p  . methylPREDNISolone (SOLU-MEDROL) injection  60 mg Intravenous Q12H  . nicotine  14 mg Transdermal Daily  . potassium chloride SA  20 mEq Oral BID  . sertraline  200 mg Oral Daily  . sodium chloride flush  3 mL Intravenous Once  . umeclidinium bromide  1 puff Inhalation Daily    Vital Signs    Vitals:   07/11/19 2002 07/11/19 2016 07/12/19 0406 07/12/19 0736  BP: 125/75  111/65 117/74  Pulse: 74  66 64  Resp: _0 Temp: 98.4 F (36.9 C)  98.2 F (36.8 C) (!) 97.5 F (36.4 C)  TempSrc: Oral   Oral  SpO2: 92% 95% 91% 96%  Weight:   111.7 kg   Height:        Intake/Output Summary (Last 24 hours) at 07/12/2019 0815 Last data filed at 07/12/2019  0738 Gross per 24 hour  Intake -  Output 3350 ml  Net -3350 ml   Filed Weights   07/10/19 0323 07/11/19 0314 07/12/19 0406  Weight: 109.1 kg 111.9 kg 111.7 kg    Physical Exam    GEN: Well nourished, well developed, in no acute distress.  HEENT: normal.  Neck: Supple, no JVD, carotid bruits, or masses. Cardiac: RRR, no murmurs, rubs, or gallops. No clubbing, cyanosis, edema.  Radials/DP/PT 2+ and equal bilaterally.  Respiratory:  Respirations regular and unlabored, clear to auscultation bilaterally. GI: Soft, nontender, nondistended, BS + x 4. MS: no deformity or atrophy. Skin: warm and dry, no rash. Neuro:  Strength and sensation are intact. Psych: Normal affect.  Labs    CBC No results for input(s): WBC, NEUTROABS, HGB, HCT, MCV, PLT in the last 72 hours. Basic Metabolic Panel Recent Labs    07/10/19 0557 07/11/19 0406  NA 143 141  K 4.8 4.5  CL 99 95*  CO2 37* 35*  GLUCOSE 92 131*  BUN 32* 33*  CREATININE 0.83 0.79  CALCIUM 8.9 9.0   Liver Function Tests No results for input(s): AST, ALT, ALKPHOS, BILITOT, PROT, ALBUMIN in the last 72 hours. No results for input(s): LIPASE, AMYLASE in the last 72 hours.  Cardiac Enzymes No results for input(s): CKTOTAL, CKMB, CKMBINDEX, TROPONINI in the last 72 hours. BNP Recent Labs    07/09/19 1632  BNP 108.0*   D-Dimer No results for input(s): DDIMER in the last 72 hours. Hemoglobin A1C No results for input(s): HGBA1C in the last 72 hours. Fasting Lipid Panel No results for input(s): CHOL, HDL, LDLCALC, TRIG, CHOLHDL, LDLDIRECT in the last 72 hours. Thyroid Function Tests No results for input(s): TSH, T4TOTAL, T3FREE, THYROIDAB in the last 72 hours.  Invalid input(s): FREET3  Telemetry    Sinus rhythm  ECG    Sinus rhythm with no ischemia  Radiology    Dg Chest 1 View  Result Date: 07/06/2019 CLINICAL DATA:  Shortness of breath, chest pain EXAM: CHEST  1 VIEW COMPARISON:  02/07/2018 FINDINGS:  Cardiomegaly. Mild, diffuse bilateral interstitial pulmonary opacity. The visualized skeletal structures are unremarkable. IMPRESSION: Cardiomegaly with mild, diffuse bilateral interstitial pulmonary opacity, likely mild edema. No focal airspace opacity. Electronically Signed   By: Eddie Candle M.D.   On: 07/06/2019 15:15   Ct Angio Chest Pe W Or Wo Contrast  Result Date: 07/07/2019 CLINICAL DATA:  Chest pain and worsening shortness of breath for 2 days. History of wheezing, hypertension, COPD, CHF, smoker. EXAM: CT ANGIOGRAPHY CHEST WITH CONTRAST TECHNIQUE: Multidetector CT imaging of the chest was performed using the standard protocol during bolus administration of intravenous contrast. Multiplanar CT image reconstructions and MIPs were obtained to evaluate the vascular anatomy. CONTRAST:  97m OMNIPAQUE IOHEXOL 350 MG/ML SOLN COMPARISON:  Chest CT dated 03/25/2019. FINDINGS: Cardiovascular: Some of the peripheral segmental and subsegmental pulmonary artery branches are difficult to definitively characterize due to patient breathing motion artifact, however, there is no pulmonary embolism identified within the main, lobar or segmental pulmonary arteries bilaterally. No thoracic aortic aneurysm or evidence of aortic dissection. Mild aortic atherosclerosis. Cardiomegaly. No pericardial effusion. Diffuse coronary artery calcifications, particularly dense within the LEFT anterior descending coronary artery. Mediastinum/Nodes: No mass or enlarged lymph nodes seen within the mediastinum or perihilar regions. Esophagus appears normal. Trachea appears normal. Lungs/Pleura: Mild bibasilar atelectasis/scarring. Mild centrilobular and paraseptal emphysema. Stable linear consolidations within the medial aspects of each lung suggesting chronic MAI. No evidence of acute pneumonia or pulmonary edema. Upper Abdomen: No acute findings on limited images of the upper abdomen. Previously described cysts within the liver, again  too small to definitively characterize. Musculoskeletal: No acute or suspicious osseous finding. Mild degenerative spurring within the thoracic spine. Review of the MIP images confirms the above findings. IMPRESSION: 1. No pulmonary embolism seen, with mild study limitations detailed above. 2. Cardiomegaly. No pericardial effusion. 3. Diffuse coronary artery calcifications, particularly dense within the LEFT anterior descending coronary artery. Recommend correlation with any possible associated cardiac symptoms. 4. Chronic/incidental lung findings detailed above. No evidence of active pneumonia or pulmonary edema. Aortic Atherosclerosis (ICD10-I70.0) and Emphysema (ICD10-J43.9). Electronically Signed   By: SFranki CabotM.D.   On: 07/07/2019 11:20    Assessment & Plan    Respiratory distress-chest x-ray on admission showed cardiomegaly with mild diffuse bilateral interstitial pulmonary opacities consistent with mild edema.  No focal airspace opacity.  Chest CT no pulmonary embolism or pericardial effusion.  Coronary calcification.  No pulmonary edema.  Improved with bronchodilators.  Does not appear to have significant coronary disease.  Right heart pressures mildly elevated.  Will need to continue with bronchodilators.  Smoking cessation was discussed.  Appreciate pulmonary's input.  Follow-up with Dr. CClayborn Bignesson discharge.Would continue with digoxin with as  needed diuresis.  Smoking cessation.  Signed, Javier Docker Gryphon Vanderveen MD 07/12/2019, 8:15 AM  Pager: [phone]

## 2019-07-12 NOTE — Progress Notes (Signed)
[place] [facility] [place] NAME: Theresa [name]    MR#:  [id number]  DATE OF BIRTH:  October 08, 1956  SUBJECTIVE:  CHIEF COMPLAINT:   Chief Complaint  Patient presents with  . Chest Pain  . Shortness of Breath   -Still feels about the same.  Still has low back pain. -Status post right heart cath and left heart cath yesterday.  Working with PT this morning  REVIEW OF SYSTEMS:  Review of Systems  Constitutional: Positive for malaise/fatigue. Negative for chills and fever.  HENT: Negative for congestion, ear discharge, hearing loss and nosebleeds.   Respiratory: Positive for shortness of breath and wheezing. Negative for cough.   Cardiovascular: Positive for orthopnea and leg swelling. Negative for chest pain and palpitations.  Gastrointestinal: Negative for abdominal pain, constipation, diarrhea, nausea and vomiting.  Genitourinary: Negative for dysuria.  Musculoskeletal: Positive for back pain and myalgias.  Neurological: Negative for dizziness, focal weakness, seizures, weakness and headaches.  Psychiatric/Behavioral: Negative for depression.    DRUG ALLERGIES:   Allergies  Allergen Reactions  . Codeine Nausea And Vomiting  . Meloxicam Other (See Comments)    Stomach pain    VITALS:  Blood pressure 117/74, pulse 64, temperature (!) 97.5 F (36.4 C), temperature source Oral, resp. rate 17, height _0  (1.575 m), weight 111.7 kg, SpO2 96 %.  PHYSICAL EXAMINATION:  Physical Exam   GENERAL:  63 y.o.-year-old morbidly obese patient lying in the bed, not in any acute distress  EYES: Pupils equal, round, reactive to light and accommodation. No scleral icterus. Extraocular muscles intact.  HEENT: Head atraumatic, normocephalic. Oropharynx and nasopharynx clear.  NECK:  Supple, no jugular venous distention. No thyroid enlargement, no tenderness.  LUNGS: Improved expiratory wheezing all over the lung fields, no crackles or rales.  Not using  accessory muscles to breathe on exertion.Marland Kitchen  CARDIOVASCULAR: S1, S2 normal. No  rubs, or gallops.  2/6 systolic murmur is present ABDOMEN: Soft, nontender, nondistended. Bowel sounds present. No organomegaly or mass.  EXTREMITIES: No pedal edema, cyanosis, or clubbing.  NEUROLOGIC: Cranial nerves II through XII are intact. Muscle strength equal in all extremities. Sensation intact. Gait not checked.  Global weakness noted PSYCHIATRIC: The patient is alert and oriented x 3.  SKIN: No obvious rash, lesion, or ulcer.    LABORATORY PANEL:   CBC Recent Labs  Lab 07/08/19 0402  WBC 14.3*  HGB 14.4  HCT 48.1*  PLT 201   ------------------------------------------------------------------------------------------------------------------  Chemistries  Recent Labs  Lab 07/06/19 1452  07/11/19 0406  NA 143   < > 141  K 3.6   < > 4.5  CL 104   < > 95*  CO2 28   < > 35*  GLUCOSE 154*   < > 131*  BUN 19   < > 33*  CREATININE 0.79   < > 0.79  CALCIUM 9.2   < > 9.0  MG 2.1  --   --    < > = values in this interval not displayed.   ------------------------------------------------------------------------------------------------------------------  Cardiac Enzymes No results for input(s): TROPONINI in the last 168 hours. ------------------------------------------------------------------------------------------------------------------  RADIOLOGY:  No results found.  EKG:   Orders placed or performed during the hospital encounter of 07/06/19  . EKG 12-Lead  . EKG 12-Lead  . ED EKG  . ED EKG  . EKG 12-Lead  . EKG 12-Lead  . EKG - 12 lead  . EKG - 12 lead    ASSESSMENT  AND PLAN:   63 y/o Female with PMH significant for chronic respiratory failure secondary to COPD on 2.5 L home oxygen, sleep apnea on CPAP, obesity, GERD, low back pain, hypertension, diastolic CHF presents to hospital secondary to worsening shortness of breath for almost 1 week.  1.  Acute on chronic respiratory  failure-secondary to COPD and CHF exacerbation -Improved oxygen requirements, on 3-4 L O2 now, patient on 2 L home oxygen -Appreciate cardiology consult. -Continue on IV Lasix, strict input and output monitoring-change to oral Lasix -on IV steroids, inhalers and nebulizers-  Using BiPAP at bedtime as well. -Change to oral steroids. -Appreciate pulmonary consult, nebs have been adjusted, patient's breathing is much improved today. -On oral Zithromax- stop after 5 days -CT angiogram negative for pulmonary embolism  2.  Acute on chronic diastolic heart failure-echo with EF of 60 to 65%. -Appreciate cardiology consult.  Continue IV Lasix.  Echo with moderately elevated pulmonary artery pressures. -Digoxin added. -Continue cardiac medications.  Held lisinopril and bisoprolol due to hypotension.  3.  Depression-on Zoloft  4.  Hypothyroidism-Synthroid-  5.  DVT prophylaxis-Lovenox  6.  CAD-patient has chronic angina and musculoskeletal chest pain.  Blood pressure dropped significantly with 1 dose of sublingual nitro.  So will hold nitro.  - Troponins are negative.  Appreciate cardiology input. -Has significant coronary artery disease on CT angiogram but no acute symptoms. -Status post left heart cath on 07/11/2019 showing moderate disease no acute occlusions.  Normal LV ejection fraction. -Also had right heart cath done showing moderately elevated pulmonary artery pressures.  7.  Low back pain-added lidocaine patch.   Independent at baseline, lives at home with husband Husband at bedside.  Physical therapy consulted  Anticipate discharge in 1 to 2 days   All the records are reviewed and case discussed with Care Management/Social Workerr. Management plans discussed with the patient, family and they are in agreement.  CODE STATUS: Full code  TOTAL TIME TAKING CARE OF THIS PATIENT: 36 minutes.   POSSIBLE D/C IN 1-2 DAYS, DEPENDING ON CLINICAL CONDITION.   Gladstone Lighter M.D on  07/12/2019 at 12:53 PM  Between 7am to 6pm - Pager - [phone]  After 6pm go to [email or web address] - password [email or web address] [place] [facility]  Office  [phone]  CC: Primary care physician; Chrismon, Vickki Muff, PA

## 2019-07-12 NOTE — Progress Notes (Signed)
Physical Therapy Treatment Patient Details Name: Theresa [name] MRN: [id number] DOB: 07/14/1956 Today's Date: 07/12/2019    History of Present Illness Pt admitted for acute on chronic respiratory failure with hypoxia with complaints of chest pain and SOB. History includes chronic 2L of O2, anxiety, CHF, COPD, DDD, and depression. Pt continues to have chest pain, ruled out for MI, cleared by cardio.     PT Comments    Pt is progressing toward goals. Upon entry, pt states that her back pain is 5/10. Pt perform bed mobility tasks, transfers, and therex with supervision from PT. RW used for both transfers and amb; pt able to amb with CGA. Pt able to amb increased distance today with 6L/min O2; SpO2 dropped to 86 following bout of ambulation, but recovered quickly with rest and pursed lip breathing. SpO2 remained ~90 with 4L/min O2 administered during therex. Pt will continue to benefit from skilled PT to address impairments in strength/ROM/mobility/activity tolerance.    Follow Up Recommendations  Home health PT;Supervision for mobility/OOB     Equipment Recommendations  None recommended by PT    Recommendations for Other Services       Precautions / Restrictions Precautions Precautions: Fall Restrictions Weight Bearing Restrictions: No    Mobility  Bed Mobility Overal bed mobility: Needs Assistance Bed Mobility: Supine to Sit     Supine to sit: Supervision     General bed mobility comments: Use of bed rail for assistance  Transfers Overall transfer level: Needs assistance Equipment used: Rolling walker (2 wheeled)   Sit to Stand: Supervision         General transfer comment: Pt able to perform transfer with UE support from walker and bed. Able to attain upright posture; 6L O2 via [employer]  Ambulation/Gait Ambulation/Gait assistance: Min guard Gait Distance (Feet): 40 Feet Assistive device: Rolling walker (2 wheeled) Gait Pattern/deviations: Step-to pattern     General  Gait Details: SpO2 at 86% following bout of amb; 6L O2 via [place] and CGA from PT provided   Stairs             Wheelchair Mobility    Modified Rankin (Stroke Patients Only)       Balance Overall balance assessment: Needs assistance;History of Falls Sitting-balance support: Feet supported Sitting balance-Leahy Scale: Good     Standing balance support: Bilateral upper extremity supported Standing balance-Leahy Scale: Fair                              Cognition Arousal/Alertness: Awake/alert Behavior During Therapy: WFL for tasks assessed/performed Overall Cognitive Status: Within Functional Limits for tasks assessed                                        Exercises Other Exercises Other Exercises: supine, 10 reps: AP, GS, HS, SLR; supervision from PT and cueing to breathe    General Comments        Pertinent Vitals/Pain Pain Assessment: 0-10 Pain Score: 5  Pain Location: Back Pain Descriptors / Indicators: Aching Pain Intervention(s): Limited activity within patient's tolerance;Repositioned    Home Living                      Prior Function            PT Goals (current goals can now be found in the care plan  section) Acute Rehab PT Goals Patient Stated Goal: to breathe easier PT Goal Formulation: With patient Time For Goal Achievement: 07/23/19 Potential to Achieve Goals: Good Progress towards PT goals: Progressing toward goals    Frequency    Min 2X/week      PT Plan Current plan remains appropriate    Co-evaluation              AM-PAC PT "6 Clicks" Mobility   Outcome Measure  Help needed turning from your back to your side while in a flat bed without using bedrails?: None Help needed moving from lying on your back to sitting on the side of a flat bed without using bedrails?: None Help needed moving to and from a bed to a chair (including a wheelchair)?: A Little Help needed standing up from a  chair using your arms (e.g., wheelchair or bedside chair)?: A Little Help needed to walk in hospital room?: A Little Help needed climbing 3-5 steps with a railing? : A Lot 6 Click Score: 19    End of Session Equipment Utilized During Treatment: Gait belt;Oxygen Activity Tolerance: Patient tolerated treatment well Patient left: in bed;with call bell/phone within reach;with bed alarm set;with family/visitor present;with SCD's reapplied   PT Visit Diagnosis: History of falling (Z91.81);Repeated falls (R29.6);Muscle weakness (generalized) (M62.81);Unsteadiness on feet (R26.81)     Time: 1132-1201 PT Time Calculation (min) (ACUTE ONLY): 29 min  Charges:                        Dixie Dials, SPT   Cloyd Ragas 07/12/2019, 1:13 PM

## 2019-07-12 NOTE — Plan of Care (Signed)
  Problem: Clinical Measurements: Goal: Ability to maintain clinical measurements within normal limits will improve Outcome: Progressing Goal: Respiratory complications will improve Outcome: Progressing Goal: Cardiovascular complication will be avoided Outcome: Progressing   Problem: Activity: Goal: Risk for activity intolerance will decrease Outcome: Progressing   Problem: Pain Managment: Goal: General experience of comfort will improve Outcome: Progressing   Problem: Safety: Goal: Ability to remain free from injury will improve Outcome: Progressing   Problem: Skin Integrity: Goal: Risk for impaired skin integrity will decrease Outcome: Progressing

## 2019-07-13 ENCOUNTER — Inpatient Hospital Stay: Payer: Medicare HMO

## 2019-07-13 DIAGNOSIS — Z23 Encounter for immunization: Secondary | ICD-10-CM | POA: Diagnosis not present

## 2019-07-13 LAB — CBC
HCT: 51.6 % — ABNORMAL HIGH (ref 36.0–46.0)
Hemoglobin: 15.8 g/dL — ABNORMAL HIGH (ref 12.0–15.0)
MCH: 29.9 pg (ref 26.0–34.0)
MCHC: 30.6 g/dL (ref 30.0–36.0)
MCV: 97.5 fL (ref 80.0–100.0)
Platelets: 153 10*3/uL (ref 150–400)
RBC: 5.29 MIL/uL — ABNORMAL HIGH (ref 3.87–5.11)
RDW: 16.5 % — ABNORMAL HIGH (ref 11.5–15.5)
WBC: 15.8 10*3/uL — ABNORMAL HIGH (ref 4.0–10.5)
nRBC: 0 % (ref 0.0–0.2)

## 2019-07-13 LAB — CREATININE, SERUM
Creatinine, Ser: 0.76 mg/dL (ref 0.44–1.00)
GFR calc Af Amer: >60 mL/min (ref >60)
GFR calc non Af Amer: >60 mL/min (ref >60)

## 2019-07-13 MED ORDER — DIGOXIN 250 MCG PO TABS: 0.2500 mg | ORAL_TABLET | Freq: Every day | ORAL | 0 refills | Status: DC

## 2019-07-13 MED ORDER — OXYCODONE HCL 5 MG PO TABS
10.0000 mg | ORAL_TABLET | Freq: Once | ORAL | Status: AC
  Administered 2019-07-13: 10 mg via ORAL
  Filled 2019-07-13: qty 2

## 2019-07-13 MED ORDER — LIDOCAINE 5 % EX PTCH: 1.0000 | MEDICATED_PATCH | CUTANEOUS | 0 refills | Status: DC

## 2019-07-13 MED ORDER — CYCLOBENZAPRINE HCL 10 MG PO TABS
10.0000 mg | ORAL_TABLET | Freq: Two times a day (BID) | ORAL | Status: DC
  Administered 2019-07-13: 10 mg via ORAL
  Filled 2019-07-13: qty 1

## 2019-07-13 MED ORDER — PREDNISONE 50 MG PO TABS: 50.0000 mg | ORAL_TABLET | Freq: Every day | ORAL | 0 refills | Status: DC

## 2019-07-13 MED ORDER — TORSEMIDE 20 MG PO TABS: 40.0000 mg | ORAL_TABLET | Freq: Two times a day (BID) | ORAL | Status: DC

## 2019-07-13 MED ORDER — POLYETHYLENE GLYCOL 3350 17 G PO PACK: 17.0000 g | PACK | Freq: Two times a day (BID) | ORAL | 0 refills | Status: DC

## 2019-07-13 MED ORDER — NICOTINE 14 MG/24HR TD PT24: 14.0000 mg | MEDICATED_PATCH | Freq: Every day | TRANSDERMAL | 0 refills | Status: DC

## 2019-07-13 MED ORDER — DIPHENHYDRAMINE HCL 25 MG PO CAPS: 25.0000 mg | ORAL_CAPSULE | Freq: Every day | ORAL | Status: DC

## 2019-07-13 MED ORDER — AZITHROMYCIN 250 MG PO TABS: 250.0000 mg | ORAL_TABLET | Freq: Every day | ORAL | 0 refills | Status: DC

## 2019-07-13 MED ORDER — FLEET ENEMA 7-19 GM/118ML RE ENEM
1.0000 | ENEMA | Freq: Once | RECTAL | Status: AC
  Administered 2019-07-13: 1 via RECTAL

## 2019-07-13 NOTE — Plan of Care (Signed)
  Problem: Pain Managment: Goal: General experience of comfort will improve Outcome: Progressing   Problem: Safety: Goal: Ability to remain free from injury will improve Outcome: Progressing   Problem: Clinical Measurements: Goal: Respiratory complications will improve Outcome: Not Progressing Note: Patient requires 4L of oxygen at this time; she wears 2L at home baseline   Problem: Activity: Goal: Risk for activity intolerance will decrease Outcome: Not Progressing Note: Patient has been sedentary during hospitalization. This RN has encouraged the patient to be more ambulatory. Patient refuses. Will continue to encourage mobility.    Problem: Elimination: Goal: Will not experience complications related to bowel motility Outcome: Not Progressing Note: Patient has not had bowel movement since 9/19. Per pt she normally has bowel movements everyday at  home. Scheduled medication given. Patient would like PRN medication in AM if scheduled medications do not work. This RN has encouraged the patient to be more active during hospital stay to help with constipation.

## 2019-07-13 NOTE — TOC Transition Note (Signed)
Transition of Care [facility]) - CM/SW Discharge Note   Patient Details  Name: Theresa [name] MRN: [id number] Date of Birth: 1955-11-05  [facility] of Care [facility]) CM/SW Contact:  Latanya Maudlin, RN Phone Number: 07/13/2019, 10:58 AM   Clinical Narrative:  Patient to be discharged per MD order. Orders in place for home health services. Spoke with patient at bedside regarding home health. Patient is agreeable and has no preference of agency as long as they are in Network with insurance. Referral placed with Tillie Rung at [facility]. Patient had DME nebulizer that was brought to the room. Chronic O2 at bedside. Spouse to transport.    Final next level of care: Home w Home Health Services Barriers to Discharge: No Barriers Identified   Patient Goals and CMS Choice   CMS Medicare.gov Compare Post Acute Care list provided to:: Patient Choice offered to / list presented to : Patient  Discharge Placement                       Discharge Plan and Services                          HH Arranged: PT, Nurse's Aide, RN [facility] Agency: [facility] Date [facility] Agency Contacted: 07/13/19 Time [place]: [id number] Representative spoke with at [place]: [place] ([place]) Interventions     Readmission Risk Interventions Readmission Risk Prevention Plan 07/13/2019  Transportation Screening Complete  PCP or Specialist Appt within 5-7 Days Complete  Home Care Screening Complete  Medication Review (RN CM) Complete  Some recent data might be hidden

## 2019-07-13 NOTE — Progress Notes (Signed)
Pulmonary Medicine          Date: 07/13/2019,   MRN# [id number] Theresa [name] 09/13/1956     AdmissionWeight: 109.4 kg                 CurrentWeight: 109.7 kg      CHIEF COMPLAINT:   Acute respiratory distress   SUBJECTIVE   Breathing improved. Pt optimized for d/c with f/u with Pulmonary on OP basis.  Discussed with husband at bedside.   PAST MEDICAL HISTORY   Past Medical History:  Diagnosis Date  . Acute postoperative pain 10/03/2017  . Anxiety   . BiPAP (biphasic positive airway pressure) dependence    at hs  . Carpal tunnel syndrome   . CHF (congestive heart failure) ([place])    1/18  . CHF (congestive heart failure) ([place])   . Chronic generalized abdominal pain   . Chronic rhinitis   . COPD (chronic obstructive pulmonary disease) ([place])   . DDD (degenerative disc disease), cervical   . DDD (degenerative disc disease), lumbosacral   . Depression   . Dyspnea   . Edema   . Flu    1/18  . Gastritis   . GERD (gastroesophageal reflux disease)   . Hematuria   . Hernia of abdominal wall 07/17/2018  . Hernia, abdominal   . Hypertension   . Kidney stones   . Low back pain   . Lumbar radiculitis   . Malodorous urine   . Muscle weakness   . Obesity   . Oxygen dependent   . Renal cyst   . Sensory urge incontinence   . Thyroid activity decreased    9/19  . Tobacco abuse   . Wheezing      SURGICAL HISTORY   Past Surgical History:  Procedure Laterality Date  . ABDOMINAL HYSTERECTOMY    . CARPAL TUNNEL RELEASE Left 2012  . EPIGASTRIC HERNIA REPAIR N/A 08/13/2018   HERNIA REPAIR EPIGASTRIC ADULT; polypropylene mesh reinforcement.  Surgeon: Robert Bellow, MD;  Location: [facility] ORS;  Service: General;  Laterality: N/A;  . RIGHT/LEFT HEART CATH AND CORONARY ANGIOGRAPHY N/A 07/11/2019   Procedure: RIGHT/LEFT HEART CATH AND CORONARY ANGIOGRAPHY;  Surgeon: Yolonda Kida, MD;  Location: [place] CV LAB;  Service: Cardiovascular;  Laterality:  N/A;  . TONSILLECTOMY    . TUBAL LIGATION    . UMBILICAL HERNIA REPAIR N/A 08/13/2018   HERNIA REPAIR UMBILICAL ADULT; polypropylene mesh reinforcement Surgeon: Robert Bellow, MD;  Location: [facility] ORS;  Service: General;  Laterality: N/A;     FAMILY HISTORY   Family History  Problem Relation Age of Onset  . Heart disease Mother   . Stroke Mother   . Coronary artery disease Mother   . Lung cancer Sister   . Cancer Sister   . Breast cancer Sister   . Alcohol abuse Father   . Heart disease Father   . Cancer Brother   . Cancer Brother   . Pneumonia Brother   . Prostate cancer Neg Hx   . Kidney cancer Neg Hx   . Bladder Cancer Neg Hx      SOCIAL HISTORY   Social History   Tobacco Use  . Smoking status: Current Every Day Smoker    Packs/day: 1.00    Years: 44.00    Pack years: 44.00    Types: Cigarettes  . Smokeless tobacco: Never Used  Substance Use Topics  . Alcohol use: No    Frequency: Never  .  Drug use: No     MEDICATIONS    Home Medication:    Current Medication:  Current Facility-Administered Medications:  .  acetaminophen (TYLENOL) tablet 650 mg, 650 mg, Oral, Q6H PRN, 650 mg at 07/07/19 0617 **OR** acetaminophen (TYLENOL) suppository 650 mg, 650 mg, Rectal, Q6H PRN, Demetrios Loll, MD .  acetaminophen (TYLENOL) tablet 650 mg, 650 mg, Oral, Q4H PRN, Callwood, Dwayne D, MD .  albuterol (PROVENTIL) (2.5 MG/3ML) 0.083% nebulizer solution 2.5 mg, 2.5 mg, Nebulization, Q2H PRN, Demetrios Loll, MD, 2.5 mg at 07/07/19 1137 .  ALPRAZolam Duanne Moron) tablet 0.5 mg, 0.5 mg, Oral, Daily PRN, Demetrios Loll, MD, 0.5 mg at 07/13/19 1114 .  aspirin EC tablet 81 mg, 81 mg, Oral, Daily, Demetrios Loll, MD, 81 mg at 07/13/19 1114 .  bisacodyl (DULCOLAX) EC tablet 5 mg, 5 mg, Oral, Daily PRN, Demetrios Loll, MD, 5 mg at 07/12/19 0917 .  budesonide (PULMICORT) nebulizer solution 0.5 mg, 0.5 mg, Nebulization, BID, Lanney Gins, Allen Shellhammer, MD, 0.5 mg at 07/13/19 0724 .  chlorhexidine (PERIDEX) 0.12 %  solution 15 mL, 15 mL, Mouth Rinse, BID, Mayo, Pete Pelt, MD, 15 mL at 07/11/19 2225 .  cholecalciferol (VITAMIN D3) tablet 2,000 Units, 2,000 Units, Oral, Daily, Demetrios Loll, MD, 2,000 Units at 07/13/19 1114 .  cyclobenzaprine (FLEXERIL) tablet 10 mg, 10 mg, Oral, BID, Mody, Sital, MD, 10 mg at 07/13/19 1114 .  digoxin (LANOXIN) tablet 0.25 mg, 0.25 mg, Oral, Daily, Lamyiah Crawshaw, MD, 0.25 mg at 07/13/19 1115 .  diphenhydrAMINE (BENADRYL) capsule 25 mg, 25 mg, Oral, QHS, Dallie Piles, RPH .  enoxaparin (LOVENOX) injection 40 mg, 40 mg, Subcutaneous, Q12H, Demetrios Loll, MD, 40 mg at 07/12/19 2031 .  furosemide (LASIX) injection 20 mg, 20 mg, Intravenous, Q8H, Gladstone Lighter, MD, 20 mg at 07/13/19 0522 .  gabapentin (NEURONTIN) capsule 300 mg, 300 mg, Oral, QID, Demetrios Loll, MD, 300 mg at 07/13/19 1434 .  guaiFENesin (ROBITUSSIN) 100 MG/5ML solution 100 mg, 5 mL, Oral, Q4H PRN, Demetrios Loll, MD .  HYDROcodone-acetaminophen (NORCO/VICODIN) 5-325 MG per tablet 1 tablet, 1 tablet, Oral, Q6H PRN, Gladstone Lighter, MD, 1 tablet at 07/13/19 1434 .  ipratropium-albuterol (DUONEB) 0.5-2.5 (3) MG/3ML nebulizer solution 3 mL, 3 mL, Nebulization, Q6H, Mayo, Pete Pelt, MD, 3 mL at 07/13/19 1335 .  lactulose (CHRONULAC) 10 GM/15ML solution 20 g, 20 g, Oral, BID PRN, Gladstone Lighter, MD, 20 g at 07/13/19 0521 .  levothyroxine (SYNTHROID) tablet 25 mcg, 25 mcg, Oral, QAC breakfast, Demetrios Loll, MD, 25 mcg at 07/13/19 [phone] .  lidocaine (LIDODERM) 5 % 1 patch, 1 patch, Transdermal, Q24H, Gladstone Lighter, MD, 1 patch at 07/13/19 1117 .  MEDLINE mouth rinse, 15 mL, Mouth Rinse, q12n4p, Mayo, Pete Pelt, MD, 15 mL at 07/12/19 1200 .  methylPREDNISolone sodium succinate (SOLU-MEDROL) 125 mg/2 mL injection 60 mg, 60 mg, Intravenous, Q12H, Lanney Gins, Trevionne Advani, MD, 60 mg at 07/13/19 0522 .  morphine 4 MG/ML injection 4 mg, 4 mg, Intravenous, Q4H PRN, Mayo, Pete Pelt, MD, 4 mg at 07/12/19 1215 .  nicotine (NICODERM CQ -  dosed in mg/24 hours) patch 14 mg, 14 mg, Transdermal, Daily, Yexalen Deike, MD .  nitroGLYCERIN (NITROSTAT) SL tablet 0.4 mg, 0.4 mg, Sublingual, Q5 min PRN, Mayo, Pete Pelt, MD, 0.4 mg at 07/07/19 1220 .  ondansetron (ZOFRAN) tablet 4 mg, 4 mg, Oral, Q6H PRN **OR** ondansetron (ZOFRAN) injection 4 mg, 4 mg, Intravenous, Q6H PRN, Demetrios Loll, MD .  ondansetron [facility]) injection 4 mg, 4 mg, Intravenous, Q6H  PRN, Lujean Amel D, MD, 4 mg at 07/11/19 1435 .  polyethylene glycol (MIRALAX / GLYCOLAX) packet 17 g, 17 g, Oral, BID, Gladstone Lighter, MD, 17 g at 07/13/19 1113 .  potassium chloride SA (K-DUR) CR tablet 20 mEq, 20 mEq, Oral, BID, Demetrios Loll, MD, 20 mEq at 07/13/19 1114 .  senna-docusate (Senokot-S) tablet 1 tablet, 1 tablet, Oral, BID, Gladstone Lighter, MD, 1 tablet at 07/13/19 1114 .  sertraline (ZOLOFT) tablet 200 mg, 200 mg, Oral, Daily, Demetrios Loll, MD, 200 mg at 07/13/19 1115 .  sodium chloride flush (NS) 0.9 % injection 3 mL, 3 mL, Intravenous, Once, Earleen Newport, MD .  [DISCONTINUED] fluticasone furoate-vilanterol (BREO ELLIPTA) 100-25 MCG/INH 1 puff, 1 puff, Inhalation, Daily, 1 puff at 07/08/19 0841 **OR** umeclidinium bromide (INCRUSE ELLIPTA) 62.5 MCG/INH 1 puff, 1 puff, Inhalation, Daily, Demetrios Loll, MD, 1 puff at 07/13/19 1116    ALLERGIES   Codeine and Meloxicam     REVIEW OF SYSTEMS    Review of Systems:  Gen:  Denies  fever, sweats, chills weigh loss  HEENT: Denies blurred vision, double vision, ear pain, eye pain, hearing loss, nose bleeds, sore throat Cardiac:  No dizziness, chest pain or heaviness, chest tightness,edema Resp:  Reports cough, sputum porduction, shortness of breath,wheezing,no hemoptysis,  Gi: Denies swallowing difficulty, stomach pain, nausea or vomiting, diarrhea, constipation, bowel incontinence Gu:  Denies bladder incontinence, burning urine Ext:   Denies Joint pain, stiffness or swelling Skin: Denies  skin rash, easy  bruising or bleeding or hives Endoc:  Denies polyuria, polydipsia , polyphagia or weight change Psych:   Denies depression, insomnia or hallucinations   Other:  All other systems negative   VS: BP 137/76 (BP Location: Right Arm)   Pulse 84   Temp 97.9 F (36.6 C) (Oral)   Resp 19   Ht 5' 2" (1.575 m)   Wt 109.7 kg   SpO2 92%   BMI 44.23 kg/m      PHYSICAL EXAM    GENERAL:NAD, no fevers, chills, no weakness no fatigue HEAD: Normocephalic, atraumatic.  EYES: Pupils equal, round, reactive to light. Extraocular muscles intact. No scleral icterus.  MOUTH: Moist mucosal membrane. Dentition intact. No abscess noted.  EAR, NOSE, THROAT: Clear without exudates. No external lesions.  NECK: Supple. No thyromegaly. No nodules. No JVD.  PULMONARY: wheezing improved CARDIOVASCULAR: S1 and S2. Regular rate and rhythm. No murmurs, rubs, or gallops. No edema. Pedal pulses 2+ bilaterally.  GASTROINTESTINAL: Soft, nontender, nondistended. No masses. Positive bowel sounds. No hepatosplenomegaly.  MUSCULOSKELETAL: No swelling, clubbing, or edema. Range of motion full in all extremities.  NEUROLOGIC: Cranial nerves II through XII are intact. No gross focal neurological deficits. Sensation intact. Reflexes intact.  SKIN: No ulceration, lesions, rashes, or cyanosis. Skin warm and dry. Turgor intact.  PSYCHIATRIC: Mood, affect within normal limits. The patient is awake, alert and oriented x 3. Insight, judgment intact.       IMAGING    Dg Chest 1 View  Result Date: 07/06/2019 CLINICAL DATA:  Shortness of breath, chest pain EXAM: CHEST  1 VIEW COMPARISON:  02/07/2018 FINDINGS: Cardiomegaly. Mild, diffuse bilateral interstitial pulmonary opacity. The visualized skeletal structures are unremarkable. IMPRESSION: Cardiomegaly with mild, diffuse bilateral interstitial pulmonary opacity, likely mild edema. No focal airspace opacity. Electronically Signed   By: Eddie Candle M.D.   On: 07/06/2019 15:15    Dg Abd 1 View  Result Date: 07/13/2019 CLINICAL DATA:  Constipation. EXAM: ABDOMEN - 1 VIEW COMPARISON:  None.  FINDINGS: No gaseous small bowel dilatation to suggest obstruction. Large stool volume evident. Phleboliths overlie the inferior pelvis bilaterally. No suspicious lytic or sclerotic osseous abnormality. IMPRESSION: Large stool volume compatible with the reported clinical history of constipation. Electronically Signed   By: Misty Stanley M.D.   On: 07/13/2019 09:49   Ct Angio Chest Pe W Or Wo Contrast  Result Date: 07/07/2019 CLINICAL DATA:  Chest pain and worsening shortness of breath for 2 days. History of wheezing, hypertension, COPD, CHF, smoker. EXAM: CT ANGIOGRAPHY CHEST WITH CONTRAST TECHNIQUE: Multidetector CT imaging of the chest was performed using the standard protocol during bolus administration of intravenous contrast. Multiplanar CT image reconstructions and MIPs were obtained to evaluate the vascular anatomy. CONTRAST:  52m OMNIPAQUE IOHEXOL 350 MG/ML SOLN COMPARISON:  Chest CT dated 03/25/2019. FINDINGS: Cardiovascular: Some of the peripheral segmental and subsegmental pulmonary artery branches are difficult to definitively characterize due to patient breathing motion artifact, however, there is no pulmonary embolism identified within the main, lobar or segmental pulmonary arteries bilaterally. No thoracic aortic aneurysm or evidence of aortic dissection. Mild aortic atherosclerosis. Cardiomegaly. No pericardial effusion. Diffuse coronary artery calcifications, particularly dense within the LEFT anterior descending coronary artery. Mediastinum/Nodes: No mass or enlarged lymph nodes seen within the mediastinum or perihilar regions. Esophagus appears normal. Trachea appears normal. Lungs/Pleura: Mild bibasilar atelectasis/scarring. Mild centrilobular and paraseptal emphysema. Stable linear consolidations within the medial aspects of each lung suggesting chronic MAI. No evidence of acute  pneumonia or pulmonary edema. Upper Abdomen: No acute findings on limited images of the upper abdomen. Previously described cysts within the liver, again too small to definitively characterize. Musculoskeletal: No acute or suspicious osseous finding. Mild degenerative spurring within the thoracic spine. Review of the MIP images confirms the above findings. IMPRESSION: 1. No pulmonary embolism seen, with mild study limitations detailed above. 2. Cardiomegaly. No pericardial effusion. 3. Diffuse coronary artery calcifications, particularly dense within the LEFT anterior descending coronary artery. Recommend correlation with any possible associated cardiac symptoms. 4. Chronic/incidental lung findings detailed above. No evidence of active pneumonia or pulmonary edema. Aortic Atherosclerosis (ICD10-I70.0) and Emphysema (ICD10-J43.9). Electronically Signed   By: SFranki CabotM.D.   On: 07/07/2019 11:20    Sonographer Comments: Suboptimal apical window. IMPRESSIONS    1. Left ventricular ejection fraction, by visual estimation, is 60 to 65%. The left ventricle has normal function. Left ventricular septal wall thickness was normal. Normal left ventricular posterior wall thickness. There is no left ventricular  hypertrophy.  2. Global right ventricle has normal systolic function.The right ventricular size is mildly enlarged. No increase in right ventricular wall thickness.  3. Left atrial size was normal.  4. Right atrial size was mildly dilated.  5. The mitral valve is grossly normal. Trace mitral valve regurgitation.  6. The tricuspid valve is not well visualized. Tricuspid valve regurgitation is mild.  7. The aortic valve was not well visualized Aortic valve regurgitation is trivial by color flow Doppler.  8. The pulmonic valve was not well visualized. Pulmonic valve regurgitation is trivial by color flow Doppler.  9. Moderately elevated pulmonary artery systolic pressure. 10. The interatrial septum  was not assessed.  FINDINGS  Left Ventricle: Left ventricular ejection fraction, by visual estimation, is 60 to 65%. The left ventricle has normal function. Left ventricular septal wall thickness was normal. Normal left ventricular posterior wall thickness. There is no left  ventricular hypertrophy.  Right Ventricle: The right ventricular size is mildly enlarged. No increase in right ventricular wall  thickness. Global RV systolic function is has normal systolic function. The tricuspid regurgitant velocity is 3.51 m/s, and with an assumed right atrial  pressure of 10 mmHg, the estimated right ventricular systolic pressure is moderately elevated at 59.4 mmHg.  Left Atrium: Left atrial size was normal in size.  Right Atrium: Right atrial size was mildly dilated  Pericardium: There is no evidence of pericardial effusion.     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 -Agree with Solumedrol and DuoNebs -Adding MetaNeb with Saline for recruitment and BPH - IS at bedside  - minimize narcotics and encourage OOB as well as work with PT  - procalcitonin for antimicrobial guidance   -currently on Zithromax - no grossly appreciable infiltrate on CT chest to suggest PNA and WBC elevation likely from peripheral demargination -TTE with relatively preserved LV function -will add pulmicort due to continued wheezing -continue noctural BIPAP on home settings     Moderate pulmonary HTN   - will obtain BNP    - likely group 3 due to chronic pulmonary dz   - continue digoxin 250 po daily for now    - would ideally like to diurese more but pressure is borderline   - cardiology on case - appreciate input - plan for L&RHC   -pt to follow up with primary pulmonologist Dr Raul Del post d/c      Tobacco abuse    - counseling for smoking cessation    - transdermal nicotine patch 37m     Thank you for allowing me to participate in the care of this  patient.   Patient/Family are satisfied with care plan and all questions have been answered.  This document was prepared using Dragon voice recognition software and may include unintentional dictation errors.     FOttie Glazier M.D.  [place]

## 2019-07-13 NOTE — Discharge Summary (Signed)
[facility] NAME: Theresa [name]    MR#:  [id number]  DATE OF BIRTH:  1956-02-27  DATE OF ADMISSION:  07/06/2019 ADMITTING PHYSICIAN: Demetrios Loll, MD  DATE OF DISCHARGE: 07/13/2019  PRIMARY CARE PHYSICIAN: Chrismon, Vickki Muff, PA    ADMISSION DIAGNOSIS:  Atypical chest pain [R07.89] COPD exacerbation (HCC) [J44.1] Congestive heart failure, unspecified HF chronicity, unspecified heart failure type ([place]) [I50.9]  DISCHARGE DIAGNOSIS:  Active Problems:   Acute on chronic respiratory failure with hypoxia ([place])   SECONDARY DIAGNOSIS:   Past Medical History:  Diagnosis Date  . Acute postoperative pain 10/03/2017  . Anxiety   . BiPAP (biphasic positive airway pressure) dependence    at hs  . Carpal tunnel syndrome   . CHF (congestive heart failure) ([place])    1/18  . CHF (congestive heart failure) ([place])   . Chronic generalized abdominal pain   . Chronic rhinitis   . COPD (chronic obstructive pulmonary disease) ([place])   . DDD (degenerative disc disease), cervical   . DDD (degenerative disc disease), lumbosacral   . Depression   . Dyspnea   . Edema   . Flu    1/18  . Gastritis   . GERD (gastroesophageal reflux disease)   . Hematuria   . Hernia of abdominal wall 07/17/2018  . Hernia, abdominal   . Hypertension   . Kidney stones   . Low back pain   . Lumbar radiculitis   . Malodorous urine   . Muscle weakness   . Obesity   . Oxygen dependent   . Renal cyst   . Sensory urge incontinence   . Thyroid activity decreased    9/19  . Tobacco abuse   . Wheezing     HOSPITAL COURSE:  63 year old female with chronic hypoxic respiratory failure due to COPD on 2-1/2 to 3 L of oxygen, sleep apnea on CPAP and chronic diastolic heart failure who presented to the hospital due to shortness of breath.  1.  Acute on chronic hypoxic respiratory failure due to combination of COPD and CHF exacerbation: Patient is now on her baseline  oxygen.  2.  Acute on chronic diastolic heart failure with preserved ejection fraction: Patient evaluated by cardiology while in hospital.  Echocardiogram shows ejection fraction 60 to 65% with moderately elevated pulmonary arterial pressures.  Cardiology has recommended digoxin.  Patient will continue her outpatient medications.  Patient will have CHF clinic upon discharge.  Patient will follow-up with cardiology.  3.  Acute COPD exacerbation: Patient symptoms have improved.  She also has acute bronchitis.  She will be discharged on prednisone 50 mg for 3 days and Zithromax.  4.Tobacco dependence: Patient is encouraged to quit smoking and willing to attempt to quit was assessed. Patient highly motivated.Counseling was provided for 4 minutes.  5.  Hypothyroidism: Continue Synthroid  6.  Constipation: Patient was provided laxatives  7.  Chronic back pain: She will continue outpatient medications  DISCHARGE CONDITIONS AND DIET:  Stable for discharge regular heart healthy  CONSULTS OBTAINED:  Treatment Team:  Teodoro Spray, MD Ottie Glazier, MD  DRUG ALLERGIES:   Allergies  Allergen Reactions  . Codeine Nausea And Vomiting  . Meloxicam Other (See Comments)    Stomach pain    DISCHARGE MEDICATIONS:   Allergies as of 07/13/2019      Reactions   Codeine Nausea And Vomiting   Meloxicam Other (See Comments)   Stomach pain  Medication List    STOP taking these medications   doxycycline 100 MG tablet Commonly known as: VIBRA-TABS   metolazone 5 MG tablet Commonly known as: ZAROXOLYN     TAKE these medications   ALPRAZolam 0.5 MG tablet Commonly known as: Xanax Take 1 tablet (0.5 mg total) by mouth 2 (two) times daily as needed for anxiety. What changed: when to take this   aspirin EC 81 MG tablet Take 81 mg by mouth daily.   azithromycin 250 MG tablet Commonly known as: Zithromax Take 1 tablet (250 mg total) by mouth daily.   bisoprolol-hydrochlorothiazide  10-6.25 MG tablet Commonly known as: ZIAC TAKE 1 TABLET BY MOUTH DAILY   [employer] D3 50 MCG (2000 UT) Caps Generic drug: Cholecalciferol Take 2,000 Units by mouth daily.   digoxin 0.25 MG tablet Commonly known as: LANOXIN Take 1 tablet (0.25 mg total) by mouth daily.   Fluticasone-Umeclidin-Vilant 100-62.5-25 MCG/INH Aepb Inhale 1 puff into the lungs daily.   gabapentin 300 MG capsule Commonly known as: NEURONTIN Take 1 capsule (300 mg total) by mouth 4 (four) times daily.   HYDROcodone-acetaminophen 5-325 MG tablet Commonly known as: NORCO/VICODIN Take 1 tablet by mouth every 8 (eight) hours as needed for severe pain. Must last 30 days   ibuprofen 200 MG tablet Commonly known as: ADVIL Take 1,200 mg by mouth every 6 (six) hours as needed for headache or moderate pain.   ipratropium-albuterol 0.5-2.5 (3) MG/3ML Soln Commonly known as: DUONEB USE 1 VIAL VIA NEBULIZER EVERY 6 HOURS AS NEEDED FOR SHORTNESS OF BREATH OR WHEEZING What changed: See the new instructions.   levothyroxine 25 MCG tablet Commonly known as: SYNTHROID Take 1 tablet (25 mcg total) by mouth daily before breakfast. DUPLICATE ENTRY   lidocaine 5 % Commonly known as: LIDODERM Place 1 patch onto the skin daily. Remove & Discard patch within 12 hours or as directed by MD   nicotine 14 mg/24hr patch Commonly known as: NICODERM CQ - dosed in mg/24 hours Place 1 patch (14 mg total) onto the skin daily.   polyethylene glycol 17 g packet Commonly known as: MIRALAX / GLYCOLAX Take 17 g by mouth 2 (two) times daily.   potassium chloride SA 20 MEQ tablet Commonly known as: K-DUR Take 1 tablet (20 mEq total) by mouth 2 (two) times daily.   predniSONE 50 MG tablet Commonly known as: DELTASONE Take 1 tablet (50 mg total) by mouth daily with breakfast for 3 days. What changed:   medication strength  how much to take   Prolensa 0.07 % Soln Generic drug: Bromfenac Sodium Place 1 drop into the left eye at  bedtime.   sertraline 100 MG tablet Commonly known as: ZOLOFT TAKE 2 TABLETS(200 MG) BY MOUTH EVERY MORNING What changed:   how much to take  how to take this  when to take this  additional instructions   torsemide 20 MG tablet Commonly known as: DEMADEX Take 2 tablets (40 mg total) by mouth 2 (two) times daily. What changed: See the new instructions.   Ventolin HFA 108 (90 Base) MCG/ACT inhaler Generic drug: albuterol Inhale 2 puffs into the lungs every 6 (six) hours as needed for shortness of breath.            Durable Medical Equipment  (From admission, onward)         Start     Ordered   07/08/19 1208  For home use only DME Nebulizer machine  Once    Question Answer  Comment  Patient needs a nebulizer to treat with the following condition COPD (chronic obstructive pulmonary disease) ([place])   Length of Need Lifetime      07/08/19 1207            Today   CHIEF COMPLAINT:  Patient with low back pain which is chronic but also has back spasm.  Patient has constipation.   VITAL SIGNS:  Blood pressure 137/76, pulse 84, temperature 97.9 F (36.6 C), temperature source Oral, resp. rate 19, height _0  (1.575 m), weight 109.7 kg, SpO2 92 %.   REVIEW OF SYSTEMS:  Review of Systems  Constitutional: Negative.  Negative for chills, fever and malaise/fatigue.  HENT: Negative.  Negative for ear discharge, ear pain, hearing loss, nosebleeds and sore throat.   Eyes: Negative.  Negative for blurred vision and pain.  Respiratory: Positive for cough. Negative for hemoptysis, shortness of breath and wheezing.   Cardiovascular: Negative.  Negative for chest pain, palpitations and leg swelling.  Gastrointestinal: Positive for constipation. Negative for abdominal pain, blood in stool, diarrhea, nausea and vomiting.  Genitourinary: Negative.  Negative for dysuria.  Musculoskeletal: Positive for back pain.  Skin: Negative.   Neurological: Negative for dizziness,  tremors, speech change, focal weakness, seizures and headaches.  Endo/Heme/Allergies: Negative.  Does not bruise/bleed easily.  Psychiatric/Behavioral: Negative.  Negative for depression, hallucinations and suicidal ideas.     PHYSICAL EXAMINATION:  GENERAL:  63 y.o.-year-old patient sitting in chair with no acute distress.  NECK:  Supple, no jugular venous distention. No thyroid enlargement, no tenderness.  LUNGS: Normal breath sounds bilaterally, no wheezing, rales,rhonchi  No use of accessory muscles of respiration.  CARDIOVASCULAR: S1, S2 normal. No murmurs, rubs, or gallops.  ABDOMEN: Soft, non-tender, non-distended. Bowel sounds present. No organomegaly or mass. +constipated EXTREMITIES: No pedal edema, cyanosis, or clubbing.  PSYCHIATRIC: The patient is alert and oriented x 3.  SKIN: No obvious rash, lesion, or ulcer.   DATA REVIEW:   CBC Recent Labs  Lab 07/13/19 0523  WBC 15.8*  HGB 15.8*  HCT 51.6*  PLT 153    Chemistries  Recent Labs  Lab 07/06/19 1452  07/11/19 0406 07/13/19 0523  NA 143   < > 141  --   K 3.6   < > 4.5  --   CL 104   < > 95*  --   CO2 28   < > 35*  --   GLUCOSE 154*   < > 131*  --   BUN 19   < > 33*  --   CREATININE 0.79   < > 0.79 0.76  CALCIUM 9.2   < > 9.0  --   MG 2.1  --   --   --    < > = values in this interval not displayed.    Cardiac Enzymes No results for input(s): TROPONINI in the last 168 hours.  Microbiology Results  _1 @  RADIOLOGY:  Dg Abd 1 View  Result Date: 07/13/2019 CLINICAL DATA:  Constipation. EXAM: ABDOMEN - 1 VIEW COMPARISON:  None. FINDINGS: No gaseous small bowel dilatation to suggest obstruction. Large stool volume evident. Phleboliths overlie the inferior pelvis bilaterally. No suspicious lytic or sclerotic osseous abnormality. IMPRESSION: Large stool volume compatible with the reported clinical history of constipation. Electronically Signed   By: Misty Stanley M.D.   On: 07/13/2019 09:49       Allergies as of 07/13/2019      Reactions   Codeine Nausea And Vomiting  Meloxicam Other (See Comments)   Stomach pain      Medication List    STOP taking these medications   doxycycline 100 MG tablet Commonly known as: VIBRA-TABS   metolazone 5 MG tablet Commonly known as: ZAROXOLYN     TAKE these medications   ALPRAZolam 0.5 MG tablet Commonly known as: Xanax Take 1 tablet (0.5 mg total) by mouth 2 (two) times daily as needed for anxiety. What changed: when to take this   aspirin EC 81 MG tablet Take 81 mg by mouth daily.   azithromycin 250 MG tablet Commonly known as: Zithromax Take 1 tablet (250 mg total) by mouth daily.   bisoprolol-hydrochlorothiazide 10-6.25 MG tablet Commonly known as: ZIAC TAKE 1 TABLET BY MOUTH DAILY   [employer] D3 50 MCG (2000 UT) Caps Generic drug: Cholecalciferol Take 2,000 Units by mouth daily.   digoxin 0.25 MG tablet Commonly known as: LANOXIN Take 1 tablet (0.25 mg total) by mouth daily.   Fluticasone-Umeclidin-Vilant 100-62.5-25 MCG/INH Aepb Inhale 1 puff into the lungs daily.   gabapentin 300 MG capsule Commonly known as: NEURONTIN Take 1 capsule (300 mg total) by mouth 4 (four) times daily.   HYDROcodone-acetaminophen 5-325 MG tablet Commonly known as: NORCO/VICODIN Take 1 tablet by mouth every 8 (eight) hours as needed for severe pain. Must last 30 days   ibuprofen 200 MG tablet Commonly known as: ADVIL Take 1,200 mg by mouth every 6 (six) hours as needed for headache or moderate pain.   ipratropium-albuterol 0.5-2.5 (3) MG/3ML Soln Commonly known as: DUONEB USE 1 VIAL VIA NEBULIZER EVERY 6 HOURS AS NEEDED FOR SHORTNESS OF BREATH OR WHEEZING What changed: See the new instructions.   levothyroxine 25 MCG tablet Commonly known as: SYNTHROID Take 1 tablet (25 mcg total) by mouth daily before breakfast. DUPLICATE ENTRY   lidocaine 5 % Commonly known as: LIDODERM Place 1 patch onto the skin daily. Remove &  Discard patch within 12 hours or as directed by MD   nicotine 14 mg/24hr patch Commonly known as: NICODERM CQ - dosed in mg/24 hours Place 1 patch (14 mg total) onto the skin daily.   polyethylene glycol 17 g packet Commonly known as: MIRALAX / GLYCOLAX Take 17 g by mouth 2 (two) times daily.   potassium chloride SA 20 MEQ tablet Commonly known as: K-DUR Take 1 tablet (20 mEq total) by mouth 2 (two) times daily.   predniSONE 50 MG tablet Commonly known as: DELTASONE Take 1 tablet (50 mg total) by mouth daily with breakfast for 3 days. What changed:   medication strength  how much to take   Prolensa 0.07 % Soln Generic drug: Bromfenac Sodium Place 1 drop into the left eye at bedtime.   sertraline 100 MG tablet Commonly known as: ZOLOFT TAKE 2 TABLETS(200 MG) BY MOUTH EVERY MORNING What changed:   how much to take  how to take this  when to take this  additional instructions   torsemide 20 MG tablet Commonly known as: DEMADEX Take 2 tablets (40 mg total) by mouth 2 (two) times daily. What changed: See the new instructions.   Ventolin HFA 108 (90 Base) MCG/ACT inhaler Generic drug: albuterol Inhale 2 puffs into the lungs every 6 (six) hours as needed for shortness of breath.            Durable Medical Equipment  (From admission, onward)         Start     Ordered   07/08/19 1208  For home  use only DME Nebulizer machine  Once    Question Answer Comment  Patient needs a nebulizer to treat with the following condition COPD (chronic obstructive pulmonary disease) ([place])   Length of Need Lifetime      07/08/19 1207            Management plans discussed with the patient and she is in agreement. Stable for discharge home with hhc  Patient should follow up with pcp  CODE STATUS:     Code Status Orders  (From admission, onward)         Start     Ordered   07/06/19 1846  Full code  Continuous     07/06/19 1845        Code Status History     Date Active Date Inactive Code Status Order ID Comments User Context   02/07/2018 2130 02/10/2018 1827 Full Code [id number]  Saundra Shelling, MD Inpatient   12/09/2017 2355 12/11/2017 1616 Full Code [id number]  Omar Person, NP ED   02/13/2017 2039 02/16/2017 1411 Full Code [id number]  Demetrios Loll, MD ED   10/25/2016 0346 11/03/2016 1636 Full Code [id number]  Awilda Bill, NP ED   11/03/2015 0245 11/04/2015 1428 Full Code [id number]  Harrie Foreman, MD Inpatient   06/29/2015 0312 07/04/2015 1555 Full Code [id number]  Hower, Aaron Mose, MD ED   04/18/2015 0559 04/21/2015 2146 Full Code [id number]  Harrie Foreman, MD Inpatient   Advance Care Planning Activity      TOTAL TIME TAKING CARE OF THIS PATIENT: 39 minutes.    Note: This dictation was prepared with Dragon dictation along with smaller phrase technology. Any transcriptional errors that result from this process are unintentional.  Bettey Costa M.D on 07/13/2019 at 10:09 AM  Between 7am to 6pm - Pager - [phone] After 6pm go to [email or web address] - password [email or web address] [facility]  Office  [phone]  CC: Primary care physician; Chrismon, Vickki Muff, PA

## 2019-07-14 ENCOUNTER — Emergency Department: Payer: Medicare HMO

## 2019-07-14 ENCOUNTER — Other Ambulatory Visit: Payer: Self-pay

## 2019-07-14 ENCOUNTER — Emergency Department
Admission: EM | Admit: 2019-07-14 | Discharge: 2019-07-14 | Disposition: A | Discharge status: Home / Self Care | Payer: Medicare HMO | Attending: Student | Admitting: Student

## 2019-07-14 DIAGNOSIS — J449 Chronic obstructive pulmonary disease, unspecified: Secondary | ICD-10-CM | POA: Insufficient documentation

## 2019-07-14 DIAGNOSIS — M5442 Lumbago with sciatica, left side: Secondary | ICD-10-CM | POA: Diagnosis not present

## 2019-07-14 DIAGNOSIS — I110 Hypertensive heart disease with heart failure: Secondary | ICD-10-CM | POA: Insufficient documentation

## 2019-07-14 DIAGNOSIS — M5432 Sciatica, left side: Secondary | ICD-10-CM | POA: Diagnosis not present

## 2019-07-14 DIAGNOSIS — M79605 Pain in left leg: Secondary | ICD-10-CM | POA: Diagnosis present

## 2019-07-14 DIAGNOSIS — Z7982 Long term (current) use of aspirin: Secondary | ICD-10-CM | POA: Insufficient documentation

## 2019-07-14 DIAGNOSIS — Z955 Presence of coronary angioplasty implant and graft: Secondary | ICD-10-CM | POA: Insufficient documentation

## 2019-07-14 DIAGNOSIS — M79662 Pain in left lower leg: Secondary | ICD-10-CM | POA: Diagnosis not present

## 2019-07-14 DIAGNOSIS — F17210 Nicotine dependence, cigarettes, uncomplicated: Secondary | ICD-10-CM | POA: Insufficient documentation

## 2019-07-14 DIAGNOSIS — I509 Heart failure, unspecified: Secondary | ICD-10-CM | POA: Diagnosis not present

## 2019-07-14 LAB — BASIC METABOLIC PANEL
Anion gap: 8 (ref 5–15)
BUN: 31 mg/dL — ABNORMAL HIGH (ref 8–23)
CO2: 34 mmol/L — ABNORMAL HIGH (ref 22–32)
Calcium: 9.3 mg/dL (ref 8.9–10.3)
Chloride: 95 mmol/L — ABNORMAL LOW (ref 98–111)
Creatinine, Ser: 0.75 mg/dL (ref 0.44–1.00)
GFR calc Af Amer: >60 mL/min (ref >60)
GFR calc non Af Amer: >60 mL/min (ref >60)
Glucose, Bld: 104 mg/dL — ABNORMAL HIGH (ref 70–99)
Potassium: 4.5 mmol/L (ref 3.5–5.1)
Sodium: 137 mmol/L (ref 135–145)

## 2019-07-14 LAB — URINALYSIS, COMPLETE (UACMP) WITH MICROSCOPIC
Bacteria, UA: NONE SEEN
Bilirubin Urine: NEGATIVE
Glucose, UA: NEGATIVE mg/dL
Ketones, ur: NEGATIVE mg/dL
Leukocytes,Ua: NEGATIVE
Nitrite: NEGATIVE
Protein, ur: NEGATIVE mg/dL
Specific Gravity, Urine: 1.019 (ref 1.005–1.030)
pH: 5 (ref 5.0–8.0)

## 2019-07-14 LAB — CBC
HCT: 49.5 % — ABNORMAL HIGH (ref 36.0–46.0)
Hemoglobin: 15.6 g/dL — ABNORMAL HIGH (ref 12.0–15.0)
MCH: 30.5 pg (ref 26.0–34.0)
MCHC: 31.5 g/dL (ref 30.0–36.0)
MCV: 96.7 fL (ref 80.0–100.0)
Platelets: 157 10*3/uL (ref 150–400)
RBC: 5.12 MIL/uL — ABNORMAL HIGH (ref 3.87–5.11)
RDW: 16.8 % — ABNORMAL HIGH (ref 11.5–15.5)
WBC: 18.3 10*3/uL — ABNORMAL HIGH (ref 4.0–10.5)
nRBC: 0 % (ref 0.0–0.2)

## 2019-07-14 MED ORDER — METHYLPREDNISOLONE 4 MG PO TBPK: ORAL_TABLET | ORAL | 0 refills | Status: DC

## 2019-07-14 MED ORDER — KETOROLAC TROMETHAMINE 30 MG/ML IJ SOLN
15.0000 mg | Freq: Once | INTRAMUSCULAR | Status: AC
  Administered 2019-07-14: 15 mg via INTRAVENOUS
  Filled 2019-07-14: qty 1

## 2019-07-14 MED ORDER — OXYCODONE HCL 5 MG PO TABS
5.0000 mg | ORAL_TABLET | Freq: Once | ORAL | Status: AC
  Administered 2019-07-14: 5 mg via ORAL
  Filled 2019-07-14: qty 1

## 2019-07-14 MED ORDER — LIDOCAINE 5 % EX PTCH
1.0000 | MEDICATED_PATCH | CUTANEOUS | Status: DC
  Administered 2019-07-14: 12:00:00 1 via TRANSDERMAL
  Filled 2019-07-14 (×2): qty 1

## 2019-07-14 MED ORDER — CYCLOBENZAPRINE HCL 10 MG PO TABS
10.0000 mg | ORAL_TABLET | Freq: Once | ORAL | Status: AC
  Administered 2019-07-14: 10 mg via ORAL
  Filled 2019-07-14: qty 1

## 2019-07-14 NOTE — ED Triage Notes (Signed)
Pt presents via POV c/o left leg pain. Reports d/c Saturday. Reports s/p [facility]. Reports had LLE pain while in hospital but worse now.

## 2019-07-14 NOTE — Discharge Instructions (Addendum)
Thank you for letting us take care of you in the emergency department today.   Please continue to take any regular, prescribed medications.   New medications we have prescribed:  - Steroid dose pack - take this to help with pain and inflammation. You can stop taking your prednisone since you have started this.  Please follow up with: - Your primary care doctor to review your ER visit and follow up on your symptoms.  - Your pain and back doctor  Please return to the ER for any new or worsening symptoms.

## 2019-07-14 NOTE — ED Notes (Signed)
Pt confirms ride home waiting outside.

## 2019-07-14 NOTE — ED Provider Notes (Signed)
[facility] Emergency Department Provider Note  ____________________________________________   First MD Initiated Contact with Patient 07/14/19 1134     (approximate)  I have reviewed the triage vital signs and the nursing notes.  History  Chief Complaint Leg Pain    HPI Theresa [name] is a 63 y.o. female with a history of CHF, COPD (on home nasal cannula), pulmonary hypertension, who presents to the emergency department for left lower back pain radiating down her left leg.  Patient was recently admitted/discharged here from 9/19 to 9/26, and had a right/left heart cath done.  Her access site was through the RIGHT groin.    She reports having this pain while in the hospital, but feels it has continued to worsen at home.  She describes it as a sharp, shooting pain that starts in her left lower back and shoots down her left leg. Currently moderate in severity.  She also describes a concurrent warmth-like sensation with the shooting pain.  She denies any associated weakness, numbness, tingling.  No leg swelling, asymmetry, edema.  No fevers, nausea, vomiting, dysuria, hematuria.  No history of DVT or PE.  She is not on any blood thinning medication.   Past Medical Hx Past Medical History:  Diagnosis Date  . Acute postoperative pain 10/03/2017  . Anxiety   . BiPAP (biphasic positive airway pressure) dependence    at hs  . Carpal tunnel syndrome   . CHF (congestive heart failure) ([place])    1/18  . CHF (congestive heart failure) ([place])   . Chronic generalized abdominal pain   . Chronic rhinitis   . COPD (chronic obstructive pulmonary disease) ([place])   . DDD (degenerative disc disease), cervical   . DDD (degenerative disc disease), lumbosacral   . Depression   . Dyspnea   . Edema   . Flu    1/18  . Gastritis   . GERD (gastroesophageal reflux disease)   . Hematuria   . Hernia of abdominal wall 07/17/2018  . Hernia, abdominal   . Hypertension   .  Kidney stones   . Low back pain   . Lumbar radiculitis   . Malodorous urine   . Muscle weakness   . Obesity   . Oxygen dependent   . Renal cyst   . Sensory urge incontinence   . Thyroid activity decreased    9/19  . Tobacco abuse   . Wheezing     Problem List Patient Active Problem List   Diagnosis Date Noted  . Acute on chronic respiratory failure with hypoxia ([place]) 07/06/2019  . Cervicalgia (Bilateral) (R>L) 09/24/2018  . Spondylosis without myelopathy or radiculopathy, lumbosacral region 09/24/2018  . Epigastric hernia 05/04/2018  . Chronic lower extremity pain (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) 01/25/2018  . Altered mental status, unspecified 12/09/2017  . Chronic hip pain [facility]) (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) 11/27/2017  . COPD (chronic obstructive pulmonary disease) ([place]) 08/11/2017  . Other long term (current) drug therapy 08/08/2017  . Disorder of bone, unspecified 08/08/2017  . COPD with hypoxia ([place]) 06/27/2017  . Arthralgia of acromioclavicular joint (Right) 06/15/2017  . Acromioclavicular joint DJD (Right) 06/15/2017  . 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  . Chronic shoulder pain (Left) 05/11/2017  . Elevated brain natriuretic peptide (BNP) level 05/11/2017  . Grief at loss of child 02/14/2017  . Lumbar facet hypertrophy (multilevel) 11/17/2016  . Lumbar spinal stenosis (L4-5) 11/17/2016  . 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) 11/17/2016  . 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) (  Left) 11/17/2016  . Lumbosacral radiculopathy at L4 11/17/2016  . Supplemental oxygen dependent 08/01/2016  . Sleep apnea 06/02/2016  . Long term current use of opiate analgesic 01/12/2016  . Long term prescription opiate use 01/12/2016  . Opiate use (15 MME/Day) 01/12/2016  . Lumbar facet syndrome (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) 01/12/2016  . Chronic sacroiliac joint pain (Bilateral) 01/12/2016  . Vitamin D deficiency 12/30/2015  . 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)  12/28/2015  . Obesity, Class III, BMI 40-49.9 (morbid obesity) ([place]) 12/28/2015  . 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 Coagulation disorder ([place]) 10/22/2015  . Chronic pain syndrome (significant psychosocial component) 09/21/2015  . 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 09/21/2015  . Encounter for therapeutic drug level monitoring 09/16/2015  . Encounter for chronic pain management 09/16/2015  . Pain management 09/16/2015  . Neurogenic pain 09/16/2015  . Neuropathic pain 09/16/2015  . Musculoskeletal pain 09/16/2015  . Lumbar spondylosis (L4-5) 09/16/2015  . Chronic low back pain (Primary Area of Pain) (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) 09/16/2015  . Chronic lower extremity pain (Secondary Area of Pain) (Left) 09/16/2015  . Chronic lumbar radicular pain (L4 & S1 Dermatome) (Left) 09/16/2015  . Chronic shoulder pain (Right side) 09/16/2015  . Chronic upper extremity pain (Right-sided) 09/16/2015  . Cervical radiculitis (Right side) 09/16/2015  . Abnormal x-ray of lumbar spine 09/16/2015  . Chronic diastolic heart failure ([place]) 07/15/2015  . Chest pain 07/15/2015  . Tobacco abuse 07/15/2015  . Blood clotting disorder ([place]) 04/21/2015  . Decreased motor strength 07/16/2014  . Clinical depression 07/16/2014  . Chronic rhinitis 07/16/2014  . Carpal tunnel syndrome 07/16/2014  . Anxiety state 07/16/2014  . Neuritis or radiculitis due to rupture of lumbar intervertebral disc 07/02/2014  . DDD (degenerative disc disease), lumbar 07/02/2014  . CAFL (chronic airflow limitation) ([place]) 02/27/2014  . Essential (primary) hypertension 03/27/2008    Past Surgical Hx Past Surgical History:  Procedure Laterality Date  . ABDOMINAL HYSTERECTOMY    . CARPAL TUNNEL RELEASE Left 2012  . EPIGASTRIC HERNIA REPAIR N/A 08/13/2018   HERNIA REPAIR EPIGASTRIC ADULT; polypropylene mesh reinforcement.  Surgeon: Robert Bellow, MD;  Location: [facility] ORS;  Service: General;   Laterality: N/A;  . RIGHT/LEFT HEART CATH AND CORONARY ANGIOGRAPHY N/A 07/11/2019   Procedure: RIGHT/LEFT HEART CATH AND CORONARY ANGIOGRAPHY;  Surgeon: Yolonda Kida, MD;  Location: [facility];  Service: Cardiovascular;  Laterality: N/A;  . TONSILLECTOMY    . TUBAL LIGATION    . UMBILICAL HERNIA REPAIR N/A 08/13/2018   HERNIA REPAIR UMBILICAL ADULT; polypropylene mesh reinforcement Surgeon: Robert Bellow, MD;  Location: [facility] ORS;  Service: General;  Laterality: N/A;    Medications Prior to Admission medications   Medication Sig Start Date End Date Taking? Authorizing Provider  ALPRAZolam Duanne Moron) 0.5 MG tablet Take 1 tablet (0.5 mg total) by mouth 2 (two) times daily as needed for anxiety. Patient taking differently: Take 0.5 mg by mouth daily as needed for anxiety.  12/09/16  Yes Darylene Price A, FNP  aspirin EC 81 MG tablet Take 81 mg by mouth daily.   Yes [provider]  azithromycin (ZITHROMAX) 250 MG tablet Take 1 tablet (250 mg total) by mouth daily. 07/13/19  Yes Bettey Costa, MD  bisoprolol-hydrochlorothiazide (ZIAC) 10-6.25 MG tablet TAKE 1 TABLET BY MOUTH DAILY 06/27/19  Yes Chrismon, Vickki Muff, PA  Bromfenac Sodium (PROLENSA) 0.07 % SOLN Place 1 drop into the left eye at bedtime.   Yes [provider]  Cholecalciferol (CVS D3) 2000 units CAPS Take 2,000 Units by mouth daily.    Yes [provider]  digoxin (LANOXIN) 0.25 MG tablet Take 1 tablet (0.25 mg total) by mouth daily. 07/13/19  Yes Bettey Costa, MD  Fluticasone-Umeclidin-Vilant 100-62.5-25 MCG/INH AEPB Inhale 1 puff into the lungs daily. 10/03/18  Yes [provider]  gabapentin (NEURONTIN) 300 MG capsule Take 1 capsule (300 mg total) by mouth 4 (four) times daily. 05/18/19 08/16/19 Yes Milinda Pointer, MD  HYDROcodone-acetaminophen (NORCO/VICODIN) 5-325 MG tablet Take 1 tablet by mouth every 8 (eight) hours as needed for severe pain. Must last 30 days Patient taking  differently: Take 1 tablet by mouth every 6 (six) hours as needed for severe pain. Must last 30 days 06/17/19 07/17/19 Yes Milinda Pointer, MD  ibuprofen (ADVIL,MOTRIN) 200 MG tablet Take 1,200 mg by mouth every 6 (six) hours as needed for headache or moderate pain.   Yes [provider]  ipratropium-albuterol (DUONEB) 0.5-2.5 (3) MG/3ML SOLN USE 1 VIAL VIA NEBULIZER EVERY 6 HOURS AS NEEDED FOR SHORTNESS OF BREATH OR WHEEZING Patient taking differently: Take 3 mLs by nebulization every 4 (four) hours as needed (shortness of breat / wheezing).  11/30/18  Yes Chrismon, Vickki Muff, PA  levothyroxine (SYNTHROID) 25 MCG tablet Take 1 tablet (25 mcg total) by mouth daily before breakfast. DUPLICATE ENTRY 02/14/14  Yes Chrismon, Simona Huh E, PA  lidocaine (LIDODERM) 5 % Place 1 patch onto the skin daily. Remove & Discard patch within 12 hours or as directed by MD 07/13/19  Yes Bettey Costa, MD  polyethylene glycol (MIRALAX / GLYCOLAX) 17 g packet Take 17 g by mouth 2 (two) times daily. 07/13/19  Yes Mody, Ulice Bold, MD  potassium chloride SA (K-DUR,KLOR-CON) 20 MEQ tablet Take 1 tablet (20 mEq total) by mouth 2 (two) times daily. 08/31/18  Yes Hackney, Otila Kluver A, FNP  predniSONE (DELTASONE) 50 MG tablet Take 1 tablet (50 mg total) by mouth daily with breakfast for 3 days. 07/13/19 07/16/19 Yes Mody, Ulice Bold, MD  sertraline (ZOLOFT) 100 MG tablet TAKE 2 TABLETS(200 MG) BY MOUTH EVERY MORNING Patient taking differently: Take 200 mg by mouth daily.  11/16/18  Yes Chrismon, Vickki Muff, PA  torsemide (DEMADEX) 20 MG tablet Take 2 tablets (40 mg total) by mouth 2 (two) times daily. 07/13/19  Yes Bettey Costa, MD  VENTOLIN HFA 108 (90 Base) MCG/ACT inhaler Inhale 2 puffs into the lungs every 6 (six) hours as needed for shortness of breath. 02/10/18  Yes Fritzi Mandes, MD    Allergies Codeine and Meloxicam  Family Hx Family History  Problem Relation Age of Onset  . Heart disease Mother   . Stroke Mother   . Coronary artery  disease Mother   . Lung cancer Sister   . Cancer Sister   . Breast cancer Sister   . Alcohol abuse Father   . Heart disease Father   . Cancer Brother   . Cancer Brother   . Pneumonia Brother   . Prostate cancer Neg Hx   . Kidney cancer Neg Hx   . Bladder Cancer Neg Hx     Social Hx Social History   Tobacco Use  . Smoking status: Current Every Day Smoker    Packs/day: 1.00    Years: 44.00    Pack years: 44.00    Types: Cigarettes  . Smokeless tobacco: Never Used  Substance Use Topics  . Alcohol use: No    Frequency: Never  . Drug use: No  Review of Systems  Constitutional: Negative for fever, chills. Eyes: Negative for visual changes. ENT: Negative for sore throat. Cardiovascular: Negative for chest pain. Respiratory: Negative for shortness of breath. Gastrointestinal: Negative for nausea, vomiting.  Genitourinary: Negative for dysuria. Musculoskeletal: + back pain, leg pain Skin: Negative for rash. Neurological: Negative for for headaches.   Physical Exam  Vital Signs: ED Triage Vitals  Enc Vitals Group     BP 07/14/19 1110 108/63     Pulse Rate 07/14/19 1110 76     Resp 07/14/19 1110 16     Temp 07/14/19 1110 98.5 F (36.9 C)     Temp Source 07/14/19 1110 Oral     SpO2 07/14/19 1110 92 %     Weight 07/14/19 1121 241 lb (109.3 kg)     Height 07/14/19 1121 5' 2" (1.575 m)     Head Circumference --      Peak Flow --      Pain Score 07/14/19 1121 9     Pain Loc --      Pain Edu? --      Excl. in [place]? --     Constitutional: Alert and oriented.  Head: Normocephalic. Atraumatic. Eyes: Conjunctivae clear. Sclera anicteric. Nose: No congestion. No rhinorrhea. Mouth/Throat: Mucous membranes are moist.  Neck: No stridor.   Cardiovascular: Normal rate, regular rhythm. Extremities well perfused.  2+ symmetric DP pulses bilaterally, by palpation.  Toes are warm and well perfused. Respiratory: Normal respiratory effort.  On baseline nasal cannula.  Gastrointestinal: Soft. Non-tender. Non-distended.  Musculoskeletal: No lower extremity edema.  Compartments are soft and compressible.  No calf or leg swelling, asymmetry, or tenderness.  Back pain is reproducible with straight leg raise on the right. Neurologic:  Normal speech and language. No gross focal neurologic deficits are appreciated.  Bilateral lower extremity strength 5/5 and symmetric.  Sensation intact light touch. Skin: Skin is warm, dry and intact. No rash noted. Psychiatric: Mood and affect are appropriate for situation.  EKG  N/A    Radiology  [place]: IMPRESSION: No evidence of deep venous thrombosis.     Procedures  Procedure(s) performed (including critical care):  Procedures   Initial Impression / Assessment and Plan / ED Course  63 y.o. female who presents to the ED for left lower back pain, radiating down the left leg.    Ddx: Presentation seems most consistent with sciatica. Also consider DVT, given her recent hospitalization and procedure.  She has easily palpable, bilateral, symmetric 2+ DP pulses, toes are WWP, do not suspect arterial disease or vascular compromise, additionally her catheter site access was on the contralateral side.  Could consider atypical UTI or pyelonephritis as well.  Plan: labs, [place], pain control, reassess  Urine is negative for infection.  Electrolytes without actionable derangements.  Moderate leukocytosis on CBC, however only mildly increased from her discharge labs yesterday, and likely in the setting of her current steroid prescription.  [place] negative.   Will plan for DC with steroid dose pack to help with her sciatica, advised follow up with her PCP and pain doctor. She has ambulated w/ steady gait to toilet in ED. Given return precautions.   Final Clinical Impression(s) / ED Diagnosis  Final diagnoses:  Sciatica of left side       Note:  This document was prepared using Dragon voice recognition software and may  include unintentional dictation errors.   Lilia Pro., MD 07/14/19 [phone]

## 2019-07-14 NOTE — ED Notes (Signed)
First Nurse Note: Pt c/o pain in LLE.

## 2019-07-15 ENCOUNTER — Telehealth: Payer: Self-pay

## 2019-07-15 ENCOUNTER — Telehealth: Payer: Self-pay | Admitting: Family

## 2019-07-15 ENCOUNTER — Ambulatory Visit: Payer: Medicare HMO | Admitting: Family

## 2019-07-15 NOTE — Telephone Encounter (Signed)
Patient did not show for her [facility] appointment on 07/15/2019. Will attempt to reschedule.

## 2019-07-15 NOTE — Telephone Encounter (Signed)
I have made the 1st attempt to contact the patient or family member in charge, in order to follow up from recently being discharged from the hospital. I left a message on voicemail requesting a CB. -MM 

## 2019-07-15 NOTE — Telephone Encounter (Signed)
No HFU scheduled.

## 2019-07-16 ENCOUNTER — Encounter: Payer: Self-pay | Admitting: Emergency Medicine

## 2019-07-16 ENCOUNTER — Emergency Department: Payer: Medicare HMO

## 2019-07-16 ENCOUNTER — Other Ambulatory Visit: Payer: Self-pay

## 2019-07-16 ENCOUNTER — Telehealth: Payer: Self-pay | Admitting: Family Medicine

## 2019-07-16 ENCOUNTER — Emergency Department
Admission: EM | Admit: 2019-07-16 | Discharge: 2019-07-16 | Disposition: A | Discharge status: Home / Self Care | Payer: Medicare HMO | Attending: Emergency Medicine | Admitting: Emergency Medicine

## 2019-07-16 ENCOUNTER — Telehealth: Payer: Self-pay

## 2019-07-16 DIAGNOSIS — I110 Hypertensive heart disease with heart failure: Secondary | ICD-10-CM | POA: Diagnosis not present

## 2019-07-16 DIAGNOSIS — G8929 Other chronic pain: Secondary | ICD-10-CM | POA: Diagnosis not present

## 2019-07-16 DIAGNOSIS — F17210 Nicotine dependence, cigarettes, uncomplicated: Secondary | ICD-10-CM | POA: Diagnosis not present

## 2019-07-16 DIAGNOSIS — Z7982 Long term (current) use of aspirin: Secondary | ICD-10-CM | POA: Diagnosis not present

## 2019-07-16 DIAGNOSIS — M5442 Lumbago with sciatica, left side: Secondary | ICD-10-CM | POA: Diagnosis not present

## 2019-07-16 DIAGNOSIS — J449 Chronic obstructive pulmonary disease, unspecified: Secondary | ICD-10-CM | POA: Insufficient documentation

## 2019-07-16 DIAGNOSIS — Z79899 Other long term (current) drug therapy: Secondary | ICD-10-CM | POA: Insufficient documentation

## 2019-07-16 DIAGNOSIS — M25562 Pain in left knee: Secondary | ICD-10-CM | POA: Diagnosis not present

## 2019-07-16 DIAGNOSIS — I509 Heart failure, unspecified: Secondary | ICD-10-CM | POA: Diagnosis not present

## 2019-07-16 DIAGNOSIS — M549 Dorsalgia, unspecified: Secondary | ICD-10-CM | POA: Diagnosis not present

## 2019-07-16 DIAGNOSIS — M5432 Sciatica, left side: Secondary | ICD-10-CM | POA: Diagnosis not present

## 2019-07-16 DIAGNOSIS — M25552 Pain in left hip: Secondary | ICD-10-CM | POA: Diagnosis not present

## 2019-07-16 LAB — URINALYSIS, COMPLETE (UACMP) WITH MICROSCOPIC
Bacteria, UA: NONE SEEN
Bilirubin Urine: NEGATIVE
Glucose, UA: NEGATIVE mg/dL
Ketones, ur: NEGATIVE mg/dL
Leukocytes,Ua: NEGATIVE
Nitrite: NEGATIVE
Protein, ur: NEGATIVE mg/dL
Specific Gravity, Urine: 1.019 (ref 1.005–1.030)
pH: 5 (ref 5.0–8.0)

## 2019-07-16 MED ORDER — OXYCODONE-ACETAMINOPHEN 5-325 MG PO TABS
1.0000 | ORAL_TABLET | Freq: Once | ORAL | Status: AC
  Administered 2019-07-16: 1 via ORAL
  Filled 2019-07-16: qty 1

## 2019-07-16 MED ORDER — BACLOFEN 10 MG PO TABS: 5.0000 mg | ORAL_TABLET | Freq: Two times a day (BID) | ORAL | 0 refills | Status: DC | PRN

## 2019-07-16 NOTE — ED Notes (Signed)
See triage note  Presents with left hip/leg pain  States sxs' started about 1 week ago    Pain radiates from hip into buttocks and anterior leg   Also has had several falls  Was seen on Sunday placed on prednisone taper  Thinks she is on day 3 or 4

## 2019-07-16 NOTE — Telephone Encounter (Signed)
She was in the hospital for a week, and she is having severe pain in her leg. She went back to the ER and  they checked her for a blood clot etc, and said that was negative. They suggested that she check with Dr. Dossie Arbour. Also she has fallen 5 times since Sunday. Please call.

## 2019-07-16 NOTE — ED Provider Notes (Signed)
[facility] Emergency Department Provider Note  ____________________________________________  Time seen: Approximately 5:52 PM  I have reviewed the triage vital signs and the nursing notes.   HISTORY  Chief Complaint Leg Pain    HPI Theresa [name] is a 63 y.o. female that presents to the emergency department for evaluation of left low back pain that radiates into left thigh and knee pain for about 1 week.  Patient states that the pain shoots from her back down her leg.  She also feels like someone is "wringing out her thigh."  Patient also states that her knee has been increasingly painful and is giving out on her.  This has caused her to fall a couple of times.  She has a walker at home but has not been using it.  Patient was in the hospital from 9/19-9 26 and had a right/left heart cath done.  Her access is through the right groin.  Patient states that pain started prior to having her procedure and she notified staff while she was in the hospital but states that this was not addressed.  Pain has not improved since she has been home.  No bowel or bladder dysfunction or saddle anesthesias.  No fevers, nausea, vomiting, dysuria, hematuria.  No history of DVT or PE.  She is not on any blood thinning medication.  Patient was evaluated for same complaint in the emergency department 2 days prior.  At this time, she had lab work, ultrasound, urinalysis completed.  She was diagnosed with sciatica.  She has been taking steroids for this without improvement.  No weakness, numbness, tingling.   Past Medical History:  Diagnosis Date  . Acute postoperative pain 10/03/2017  . Anxiety   . BiPAP (biphasic positive airway pressure) dependence    at hs  . Carpal tunnel syndrome   . CHF (congestive heart failure) ([place])    1/18  . CHF (congestive heart failure) ([place])   . Chronic generalized abdominal pain   . Chronic rhinitis   . COPD (chronic obstructive pulmonary disease) ([place])    . DDD (degenerative disc disease), cervical   . DDD (degenerative disc disease), lumbosacral   . Depression   . Dyspnea   . Edema   . Flu    1/18  . Gastritis   . GERD (gastroesophageal reflux disease)   . Hematuria   . Hernia of abdominal wall 07/17/2018  . Hernia, abdominal   . Hypertension   . Kidney stones   . Low back pain   . Lumbar radiculitis   . Malodorous urine   . Muscle weakness   . Obesity   . Oxygen dependent   . Renal cyst   . Sensory urge incontinence   . Thyroid activity decreased    9/19  . Tobacco abuse   . Wheezing     Patient Active Problem List   Diagnosis Date Noted  . Acute on chronic respiratory failure with hypoxia ([place]) 07/06/2019  . Cervicalgia (Bilateral) (R>L) 09/24/2018  . Spondylosis without myelopathy or radiculopathy, lumbosacral region 09/24/2018  . Epigastric hernia 05/04/2018  . Chronic lower extremity pain (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) 01/25/2018  . Altered mental status, unspecified 12/09/2017  . Chronic hip pain [facility]) (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) 11/27/2017  . COPD (chronic obstructive pulmonary disease) ([place]) 08/11/2017  . Other long term (current) drug therapy 08/08/2017  . Disorder of bone, unspecified 08/08/2017  . COPD with hypoxia ([place]) 06/27/2017  . Arthralgia of acromioclavicular joint (Right) 06/15/2017  . Acromioclavicular  joint DJD (Right) 06/15/2017  . 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  . Chronic shoulder pain (Left) 05/11/2017  . Elevated brain natriuretic peptide (BNP) level 05/11/2017  . Grief at loss of child 02/14/2017  . Lumbar facet hypertrophy (multilevel) 11/17/2016  . Lumbar spinal stenosis (L4-5) 11/17/2016  . 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) 11/17/2016  . 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 Lumbosacral radiculopathy at L4 11/17/2016  . Supplemental oxygen dependent 08/01/2016  . Sleep apnea 06/02/2016  . Long term current use of opiate  analgesic 01/12/2016  . Long term prescription opiate use 01/12/2016  . Opiate use (15 MME/Day) 01/12/2016  . Lumbar facet syndrome (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) 01/12/2016  . Chronic sacroiliac joint pain (Bilateral) 01/12/2016  . Vitamin D deficiency 12/30/2015  . 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 Obesity, Class III, BMI 40-49.9 (morbid obesity) ([place]) 12/28/2015  . 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[place]) 11/19/2015  . Coagulation disorder ([place]) 10/22/2015  . Chronic pain syndrome (significant psychosocial component) 09/21/2015  . 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 09/21/2015  . Encounter for therapeutic drug level monitoring 09/16/2015  . Encounter for chronic pain management 09/16/2015  . Pain management 09/16/2015  . Neurogenic pain 09/16/2015  . Neuropathic pain 09/16/2015  . Musculoskeletal pain 09/16/2015  . Lumbar spondylosis (L4-5) 09/16/2015  . Chronic low back pain (Primary Area of Pain) (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) 09/16/2015  . Chronic lower extremity pain (Secondary Area of Pain) (Left) 09/16/2015  . Chronic lumbar radicular pain (L4 & S1 Dermatome) (Left) 09/16/2015  . Chronic shoulder pain (Right side) 09/16/2015  . Chronic upper extremity pain (Right-sided) 09/16/2015  . Cervical radiculitis (Right side) 09/16/2015  . Abnormal x-ray of lumbar spine 09/16/2015  . Chronic diastolic heart failure ([place]) 07/15/2015  . Chest pain 07/15/2015  . Tobacco abuse 07/15/2015  . Blood clotting disorder ([place]) 04/21/2015  . Decreased motor strength 07/16/2014  . Clinical depression 07/16/2014  . Chronic rhinitis 07/16/2014  . Carpal tunnel syndrome 07/16/2014  . Anxiety state 07/16/2014  . Neuritis or radiculitis due to rupture of lumbar intervertebral disc 07/02/2014  . DDD (degenerative disc disease), lumbar 07/02/2014  . CAFL (chronic airflow limitation) ([place]) 02/27/2014  . Essential (primary) hypertension 03/27/2008    Past Surgical  History:  Procedure Laterality Date  . ABDOMINAL HYSTERECTOMY    . CARPAL TUNNEL RELEASE Left 2012  . EPIGASTRIC HERNIA REPAIR N/A 08/13/2018   HERNIA REPAIR EPIGASTRIC ADULT; polypropylene mesh reinforcement.  Surgeon: Robert Bellow, MD;  Location: [facility] ORS;  Service: General;  Laterality: N/A;  . RIGHT/LEFT HEART CATH AND CORONARY ANGIOGRAPHY N/A 07/11/2019   Procedure: RIGHT/LEFT HEART CATH AND CORONARY ANGIOGRAPHY;  Surgeon: Yolonda Kida, MD;  Location: [place] CV [facility];  Service: Cardiovascular;  Laterality: N/A;  . TONSILLECTOMY    . TUBAL LIGATION    . UMBILICAL HERNIA REPAIR N/A 08/13/2018   HERNIA REPAIR UMBILICAL ADULT; polypropylene mesh reinforcement Surgeon: Robert Bellow, MD;  Location: [facility] ORS;  Service: General;  Laterality: N/A;    Prior to Admission medications   Medication Sig Start Date End Date Taking? Authorizing Provider  ALPRAZolam Duanne Moron) 0.5 MG tablet Take 1 tablet (0.5 mg total) by mouth 2 (two) times daily as needed for anxiety. Patient taking differently: Take 0.5 mg by mouth daily as needed for anxiety.  12/09/16   [name], FNP  aspirin EC 81 MG tablet Take 81 mg by mouth daily.    [provider]  azithromycin (ZITHROMAX) 250 MG tablet Take 1 tablet (250 mg total) by mouth daily. 07/13/19   Bettey Costa, MD  baclofen (LIORESAL) 10 MG tablet Take 0.5 tablets (5 mg total) by mouth 2 (two) times daily as needed for muscle spasms. 07/16/19 07/15/20  Laban Emperor, PA-C  bisoprolol-hydrochlorothiazide [facility]) 10-6.25 MG tablet TAKE 1 TABLET BY MOUTH DAILY 06/27/19   Chrismon, Vickki Muff, PA  Bromfenac Sodium (PROLENSA) 0.07 % SOLN Place 1 drop into the left eye at bedtime.    [provider]  Cholecalciferol ([facility] D3) 2000 units CAPS Take 2,000 Units by mouth daily.     [provider]  digoxin (LANOXIN) 0.25 MG tablet Take 1 tablet (0.25 mg total) by mouth daily. 07/13/19   Bettey Costa, MD  Fluticasone-Umeclidin-Vilant  100-62.5-25 MCG/INH AEPB Inhale 1 puff into the lungs daily. 10/03/18   [provider]  gabapentin (NEURONTIN) 300 MG capsule Take 1 capsule (300 mg total) by mouth 4 (four) times daily. 05/18/19 08/16/19  Milinda Pointer, MD  HYDROcodone-acetaminophen (NORCO/VICODIN) 5-325 MG tablet Take 1 tablet by mouth every 8 (eight) hours as needed for severe pain. Must last 30 days Patient taking differently: Take 1 tablet by mouth every 6 (six) hours as needed for severe pain. Must last 30 days 06/17/19 07/17/19  Milinda Pointer, MD  ibuprofen (ADVIL,MOTRIN) 200 MG tablet Take 1,200 mg by mouth every 6 (six) hours as needed for headache or moderate pain.    [provider]  ipratropium-albuterol (DUONEB) 0.5-2.5 (3) MG/3ML SOLN USE 1 VIAL VIA NEBULIZER EVERY 6 HOURS AS NEEDED FOR SHORTNESS OF BREATH OR WHEEZING Patient taking differently: Take 3 mLs by nebulization every 4 (four) hours as needed (shortness of breat / wheezing).  11/30/18   Chrismon, Vickki Muff, PA  levothyroxine (SYNTHROID) 25 MCG tablet Take 1 tablet (25 mcg total) by mouth daily before breakfast. DUPLICATE ENTRY 5/63/89   Chrismon, Simona Huh E, PA  lidocaine (LIDODERM) 5 % Place 1 patch onto the skin daily. Remove & Discard patch within 12 hours or as directed by MD 07/13/19   Bettey Costa, MD  polyethylene glycol (MIRALAX / GLYCOLAX) 17 g packet Take 17 g by mouth 2 (two) times daily. 07/13/19   Bettey Costa, MD  potassium chloride SA (K-DUR,KLOR-CON) 20 MEQ tablet Take 1 tablet (20 mEq total) by mouth 2 (two) times daily. 08/31/18   [name], FNP  sertraline (ZOLOFT) 100 MG tablet TAKE 2 TABLETS(200 MG) BY MOUTH EVERY MORNING Patient taking differently: Take 200 mg by mouth daily.  11/16/18   Chrismon, Vickki Muff, PA  torsemide (DEMADEX) 20 MG tablet Take 2 tablets (40 mg total) by mouth 2 (two) times daily. 07/13/19   Bettey Costa, MD  VENTOLIN HFA 108 (90 Base) MCG/ACT inhaler Inhale 2 puffs into the lungs every 6 (six) hours  as needed for shortness of breath. 02/10/18   Fritzi Mandes, MD    Allergies Codeine and Meloxicam  Family History  Problem Relation Age of Onset  . Heart disease Mother   . Stroke Mother   . Coronary artery disease Mother   . Lung cancer Sister   . Cancer Sister   . Breast cancer Sister   . Alcohol abuse Father   . Heart disease Father   . Cancer Brother   . Cancer Brother   . Pneumonia Brother   . Prostate cancer Neg Hx   . Kidney cancer Neg Hx   . Bladder Cancer Neg Hx     Social History  Social History   Tobacco Use  . Smoking status: Current Every Day Smoker    Packs/day: 1.00    Years: 44.00    Pack years: 44.00    Types: Cigarettes  . Smokeless tobacco: Never Used  Substance Use Topics  . Alcohol use: No    Frequency: Never  . Drug use: No     Review of Systems  Cardiovascular: No chest pain. Respiratory: No SOB. Gastrointestinal: No abdominal pain.  No nausea, no vomiting.  Musculoskeletal: Positive for back, leg, knee pain. Skin: Negative for rash, abrasions, lacerations, ecchymosis. Neurological: Negative for headaches, numbness or tingling   ____________________________________________   PHYSICAL EXAM:  VITAL SIGNS: ED Triage Vitals [07/16/19 1700]  Enc Vitals Group     BP (!) 100/58     Pulse Rate 71     Resp 20     Temp 98.7 F (37.1 C)     Temp Source Oral     SpO2 92 %     Weight 242 lb (109.8 kg)     Height _0  (1.575 m)     Head Circumference      Peak Flow      Pain Score      Pain Loc      Pain Edu?      Excl. in [place]?      Constitutional: Alert and oriented. Well appearing and in no acute distress. Eyes: Conjunctivae are normal. PERRL. EOMI. Head: Atraumatic. ENT:      Ears:      Nose: No congestion/rhinnorhea.      Mouth/Throat: Mucous membranes are moist.  Neck: No stridor.   Cardiovascular: Normal rate, regular rhythm.  Good peripheral circulation. Respiratory: Normal respiratory effort without tachypnea or  retractions. Lungs CTAB. Good air entry to the bases with no decreased or absent breath sounds. Musculoskeletal: Full range of motion to all extremities. No gross deformities appreciated.  Tenderness to palpation to left lumbar paraspinal muscles.  No tenderness to palpation to lumbar spine.  Full range of motion of hips bilaterally.  Full range of motion of left knee.  Strength equal in lower extremities bilaterally.  Weightbearing, slow gait. Neurologic:  Normal speech and language. No gross focal neurologic deficits are appreciated.  Skin:  Skin is warm, dry and intact. No rash noted. Psychiatric: Mood and affect are normal. Speech and behavior are normal. Patient exhibits appropriate insight and judgement.   ____________________________________________   LABS (all labs ordered are listed, but only abnormal results are displayed)  Labs Reviewed  URINALYSIS, COMPLETE (UACMP) WITH MICROSCOPIC - Abnormal; Notable for the following components:      Result Value   Color, Urine YELLOW (*)    APPearance CLEAR (*)    Hgb urine dipstick SMALL (*)    All other components within normal limits   ____________________________________________  EKG   ____________________________________________  RADIOLOGY Robinette Haines, personally viewed and evaluated these images (plain radiographs) as part of my medical decision making, as well as reviewing the written report by the radiologist.  Dg Lumbar Spine 2-3 Views  Result Date: 07/16/2019 CLINICAL DATA:  63 year old female with back pain. EXAM: LUMBAR SPINE - 2-3 VIEW; DG HIP (WITH OR WITHOUT PELVIS) 2-3V LEFT COMPARISON:  Left hip radiograph dated 12/29/2015 FINDINGS: There is no acute fracture or subluxation the lumbar spine. Multilevel degenerative changes with disc space narrowing and endplate irregularity most prominent involving the lower lumbar at L3-L4, L4-L5 and L5-S1. Lower lumbar facet arthropathy with probable associated narrowing  of the  neural foramina at L5-S1. The visualized posterior elements are intact. There is no acute fracture or subluxation of the hip or pelvis. Mild bilateral hip arthritic changes. The soft tissues are unremarkable. IMPRESSION: 1. No acute fracture or subluxation of the lumbar spine or pelvis/hips. 2. Multilevel degenerative changes of the spine and mild bilateral hip arthritic changes. Electronically Signed   By: Anner Crete M.D.   On: 07/16/2019 18:47   Dg Knee Complete 4 Views Left  Result Date: 07/16/2019 CLINICAL DATA:  Left knee pain. EXAM: LEFT KNEE - COMPLETE 4+ VIEW COMPARISON:  None. FINDINGS: No evidence of fracture, dislocation, or joint effusion. Curvilinear osseous density about the medial femoral metaphysis may represent prior MCL injury. No evidence of arthropathy or other focal bone abnormality. There are vascular calcifications. Soft tissues are unremarkable. IMPRESSION: 1. No acute abnormality. 2. Peripheral vascular calcifications. Electronically Signed   By: Keith Rake M.D.   On: 07/16/2019 20:01   Dg Hip Unilat W Or Wo Pelvis 2-3 Views Left  Result Date: 07/16/2019 CLINICAL DATA:  63 year old female with back pain. EXAM: LUMBAR SPINE - 2-3 VIEW; DG HIP (WITH OR WITHOUT PELVIS) 2-3V LEFT COMPARISON:  Left hip radiograph dated 12/29/2015 FINDINGS: There is no acute fracture or subluxation the lumbar spine. Multilevel degenerative changes with disc space narrowing and endplate irregularity most prominent involving the lower lumbar at L3-L4, L4-L5 and L5-S1. Lower lumbar facet arthropathy with probable associated narrowing of the neural foramina at L5-S1. The visualized posterior elements are intact. There is no acute fracture or subluxation of the hip or pelvis. Mild bilateral hip arthritic changes. The soft tissues are unremarkable. IMPRESSION: 1. No acute fracture or subluxation of the lumbar spine or pelvis/hips. 2. Multilevel degenerative changes of the spine and mild bilateral hip  arthritic changes. Electronically Signed   By: Anner Crete M.D.   On: 07/16/2019 18:47    ____________________________________________    PROCEDURES  Procedure(s) performed:    Procedures    Medications  oxyCODONE-acetaminophen (PERCOCET/ROXICET) 5-325 MG per tablet 1 tablet (1 tablet Oral Given 07/16/19 1757)     ____________________________________________   INITIAL IMPRESSION / ASSESSMENT AND PLAN / ED COURSE  Pertinent labs & imaging results that were available during my care of the patient were reviewed by me and considered in my medical decision making (see chart for details).  Review of the [place] CSRS was performed in accordance of the [place] prior to dispensing any controlled drugs.     Patient presented the emergency department for evaluation of low back pain and knee pain for about 1 week.  Vital signs and exam are reassuring.  Symptoms are consistent with sciatica.  X-rays consistent with chronic changes.  Ultrasound completed 2 days ago and negative for DVT.  Patient is able to ambulate herself in the emergency department.  Patient was offered admission for falls due to pain and declines.  She would like to go home.  Knee Ace wrap was placed.  Patient is agreeable to use walker during ambulation.  Patient will be discharged home with prescriptions for a short course on low-dose of baclofen. Patient is to follow up with primary care and orthopedics as directed. Patient is given ED precautions to return to the ED for any worsening or new symptoms.  Meleana Commerford was evaluated in Emergency Department on 07/16/2019 for the symptoms described in the history of present illness. She was evaluated in the context of the global COVID-19 pandemic, which necessitated consideration that the  patient might be at risk for infection with the SARS-CoV-2 virus that causes COVID-19. Institutional protocols and algorithms that pertain to the evaluation of patients at risk for COVID-19 are in  a state of rapid change based on information released by regulatory bodies including the CDC and federal and state organizations. These policies and algorithms were followed during the patient's care in the ED.   ____________________________________________  FINAL CLINICAL IMPRESSION(S) / ED DIAGNOSES  Final diagnoses:  Chronic pain of left knee  Sciatica of left side      NEW MEDICATIONS STARTED DURING THIS VISIT:  ED Discharge Orders         Ordered    baclofen (LIORESAL) 10 MG tablet  2 times daily PRN     07/16/19 2026              This chart was dictated using voice recognition software/Dragon. Despite best efforts to proofread, errors can occur which can change the meaning. Any change was purely unintentional.    Laban Emperor, PA-C 07/16/19 2252    Vanessa [place], MD 07/17/19 1901

## 2019-07-16 NOTE — Telephone Encounter (Signed)
I wouldn't want to jeopardize her ability to get medications from Dr. Dossie Arbour and would want his approval as to who would be the best neurologist to refer to coordinate their plans of treatment. The last MRI done on 08-31-18 by Theresa David, NP, showed new severe foraminal stenosis at L5-S1 due to disc protrusion and L5 nerve impingement. A neurosurgeon evaluation may be necessary, although, your respiratory problems make you a high risk for any general anesthesia.

## 2019-07-16 NOTE — Telephone Encounter (Signed)
Acknowledged.

## 2019-07-16 NOTE — Discharge Instructions (Signed)
Your x-rays show that you have arthritis.  Please use a warm compress.  You can continue to take your usual hydrocodone prescription.  You can also take baclofen as needed.  This medication may make you sleepy.  You can keep using lidocaine patches.  Please use walker consistently.  Keep Ace wrap to knee.  Please call your primary care provider tomorrow for an appointment this week for follow-up.  Also follow-up with Dr. Sharlet Salina.

## 2019-07-16 NOTE — Telephone Encounter (Signed)
Dr. Dossie Arbour is handling pain medications and she should contact him for extra pain medications. From his virtual visit dated 05-06-19, he had set up a refill of Hydrocodone for 07-17-19 and probably would allow her to get this one day early.

## 2019-07-16 NOTE — Telephone Encounter (Signed)
Advised

## 2019-07-16 NOTE — Telephone Encounter (Signed)
Transition Care Management Follow-up Telephone Call  Date of discharge and from where: [facility] on 07/13/19.  How have you been since you were released from the hospital? Not good due to sciatica pain in left leg. This pain and weakness has caused pt to fall 5 times in the last two days. Current pain score is an 8 with pain medications. Pain feels like her leg is "being pressed on." Pt has had some nausea due to pain and is experiencing tingling in her hands a feet. Pt states her breathing is better an stable. O2 stat has been staying in the 90s. Declined SOB, fever or v/d.  Any questions or concerns? Yes, due to pain in left leg.   Items Reviewed:  Did the pt receive and understand the discharge instructions provided? Yes   Medications obtained and verified? No, pt declined.   Any new allergies since your discharge? No   Dietary orders reviewed? Yes  Do you have support at home? Yes   Other (ie: DME, Home Health, etc) [place] orders placed.  Functional Questionnaire: (I = Independent and D = Dependent)  Bathing/Dressing- I   Meal Prep- I  Eating- I  Maintaining continence- Has some urine leakage.  Transferring/Ambulation- D, uses a walker and wheelchair for assistance.   Managing Meds- I   Follow up appointments reviewed:    PCP Hospital f/u appt confirmed? Yes  Scheduled to see Simona Huh on 07/18/19 @ 3:00 PM.  Specialist Hospital f/u appt confirmed? No, pt missed first apt but plans to [facility] and r/s apt with cardiologist.   Are transportation arrangements needed? No   If their condition worsens, is the pt aware to call  their PCP or go to the ED? Yes  Was the patient provided with contact information for the PCP's office or ED? Yes  Was the pt encouraged to call back with questions or concerns? Yes

## 2019-07-16 NOTE — Telephone Encounter (Signed)
It looks like she is a chronic pain patient and had Rx on 06/17/19 from ? Pain clinic

## 2019-07-16 NOTE — Telephone Encounter (Signed)
Patient is not requesting pain medication she is wanting referral to neurologist.  She states she has fallen 5 times since Sat.  She said her legs will just give out on her.  She said multiple times that she is having excruciating pain in her legs and just wants someone to see her.  When asked if the ER mentioned seeing neuro she said they told her to see her PCP.  She said that there isn't anything her PCP can do she needs a neurologist.  Then she said she was getting ready to go back to the ER.  I advised her that I would give you this information and see what could be arranged.  She then said that she has to see a neurologist because her arms are so weak and her legs are getting bad.  When asked about the ER visit, she said they just checked her for a blood clot.

## 2019-07-16 NOTE — ED Triage Notes (Signed)
Pt to ER with c/o left thigh pain that started last week.  She states pain started in her low back and moved into her leg.  States back pain is somewhat better, but leg has continued to bother her.  Pt denies swelling to legs, states strength is less in this leg due to the pain.  Pt denies other symptoms at this time.

## 2019-07-16 NOTE — Telephone Encounter (Signed)
Pt was just in the hospital for copd and was released on Saturday but she she having bad sciatica pain and went to the ER Sunday and Monday.  They gave her patches lidocain but she says she is stil in a lot of pain  Please advise  [phone]   Con [place]

## 2019-07-16 NOTE — Telephone Encounter (Signed)
Having worsened pain in right leg. Will schedule eval appt with Dr. Dossie Arbour.

## 2019-07-18 ENCOUNTER — Encounter: Payer: Self-pay | Admitting: Family Medicine

## 2019-07-18 ENCOUNTER — Other Ambulatory Visit: Payer: Self-pay

## 2019-07-18 ENCOUNTER — Ambulatory Visit (INDEPENDENT_AMBULATORY_CARE_PROVIDER_SITE_OTHER): Payer: Medicare HMO | Admitting: Family Medicine

## 2019-07-18 VITALS — BP 100/52 | HR 81 | Temp 97.1°F

## 2019-07-18 DIAGNOSIS — J441 Chronic obstructive pulmonary disease with (acute) exacerbation: Secondary | ICD-10-CM | POA: Diagnosis not present

## 2019-07-18 DIAGNOSIS — M79652 Pain in left thigh: Secondary | ICD-10-CM

## 2019-07-18 DIAGNOSIS — J9621 Acute and chronic respiratory failure with hypoxia: Secondary | ICD-10-CM | POA: Diagnosis not present

## 2019-07-18 DIAGNOSIS — M5126 Other intervertebral disc displacement, lumbar region: Secondary | ICD-10-CM | POA: Diagnosis not present

## 2019-07-18 DIAGNOSIS — I5032 Chronic diastolic (congestive) heart failure: Secondary | ICD-10-CM

## 2019-07-18 MED ORDER — BACLOFEN 10 MG PO TABS: 10.0000 mg | ORAL_TABLET | Freq: Three times a day (TID) | ORAL | 0 refills | Status: DC | PRN

## 2019-07-18 NOTE — Progress Notes (Signed)
Theresa [name]  MRN: [id number] DOB: [birth year]  Subjective:  HPI   The patient is a 63 year old female who presents today for follow up of hospitalization from 07/06/19-07/13/19.   The patient was admitted with diagnosis of atypical chest pain, COPD exacerbation and CHF.   Her discharge diagnosis was Acute on chronic respiratory failure with hypoxia.   Patient had negative COVID 19 on 07/06/19. Since being released from the hospital she has been back to the ER 3 times for left lower back pain that radiates down her leg.   Patient Active Problem List   Diagnosis Date Noted  . Acute on chronic respiratory failure with hypoxia ([place]) 07/06/2019  . Cervicalgia (Bilateral) (R>L) 09/24/2018  . Spondylosis without myelopathy or radiculopathy, lumbosacral region 09/24/2018  . Epigastric hernia 05/04/2018  . Chronic lower extremity pain (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) 01/25/2018  . Altered mental status, unspecified 12/09/2017  . Chronic hip pain [facility]) (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) 11/27/2017  . COPD (chronic obstructive pulmonary disease) ([place]) 08/11/2017  . Other long term (current) drug therapy 08/08/2017  . Disorder of bone, unspecified 08/08/2017  . COPD with hypoxia ([place]) 06/27/2017  . Arthralgia of acromioclavicular joint (Right) 06/15/2017  . Acromioclavicular joint DJD (Right) 06/15/2017  . 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  . Chronic shoulder pain (Left) 05/11/2017  . Elevated brain natriuretic peptide (BNP) level 05/11/2017  . Grief at loss of child 02/14/2017  . Lumbar facet hypertrophy (multilevel) 11/17/2016  . Lumbar spinal stenosis (L4-5) 11/17/2016  . 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) 11/17/2016  . 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 Lumbosacral radiculopathy at L4 11/17/2016  . Supplemental oxygen dependent 08/01/2016  . Sleep apnea 06/02/2016  . Long term current use of opiate analgesic 01/12/2016  . Long term  prescription opiate use 01/12/2016  . Opiate use (15 MME/Day) 01/12/2016  . Lumbar facet syndrome (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) 01/12/2016  . Chronic sacroiliac joint pain (Bilateral) 01/12/2016  . Vitamin D deficiency 12/30/2015  . 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 Obesity, Class III, BMI 40-49.9 (morbid obesity) ([place]) 12/28/2015  . 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[place]) 11/19/2015  . Coagulation disorder ([place]) 10/22/2015  . Chronic pain syndrome (significant psychosocial component) 09/21/2015  . 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 09/21/2015  . Encounter for therapeutic drug level monitoring 09/16/2015  . Encounter for chronic pain management 09/16/2015  . Pain management 09/16/2015  . Neurogenic pain 09/16/2015  . Neuropathic pain 09/16/2015  . Musculoskeletal pain 09/16/2015  . Lumbar spondylosis (L4-5) 09/16/2015  . Chronic low back pain (Primary Area of Pain) (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) 09/16/2015  . Chronic lower extremity pain (Secondary Area of Pain) (Left) 09/16/2015  . Chronic lumbar radicular pain (L4 & S1 Dermatome) (Left) 09/16/2015  . Chronic shoulder pain (Right side) 09/16/2015  . Chronic upper extremity pain (Right-sided) 09/16/2015  . Cervical radiculitis (Right side) 09/16/2015  . Abnormal x-ray of lumbar spine 09/16/2015  . Chronic diastolic heart failure ([place]) 07/15/2015  . Chest pain 07/15/2015  . Tobacco abuse 07/15/2015  . Blood clotting disorder ([place]) 04/21/2015  . Decreased motor strength 07/16/2014  . Clinical depression 07/16/2014  . Chronic rhinitis 07/16/2014  . Carpal tunnel syndrome 07/16/2014  . Anxiety state 07/16/2014  . Neuritis or radiculitis due to rupture of lumbar intervertebral disc 07/02/2014  . DDD (degenerative disc disease), lumbar 07/02/2014  . CAFL (chronic airflow limitation) ([place]) 02/27/2014  . Essential (primary) hypertension 03/27/2008    Past Medical History:  Diagnosis Date  . Acute  postoperative pain 10/03/2017  . Anxiety   . BiPAP (biphasic positive airway pressure) dependence    at hs  . Carpal tunnel syndrome   . CHF (congestive heart failure) ([place])    1/18  . CHF (congestive heart failure) ([place])   . Chronic generalized abdominal pain   . Chronic rhinitis   . COPD (chronic obstructive pulmonary disease) ([place])   . DDD (degenerative disc disease), cervical   . DDD (degenerative disc disease), lumbosacral   . Depression   . Dyspnea   . Edema   . Flu    1/18  . Gastritis   . GERD (gastroesophageal reflux disease)   . Hematuria   . Hernia of abdominal wall 07/17/2018  . Hernia, abdominal   . Hypertension   . Kidney stones   . Low back pain   . Lumbar radiculitis   . Malodorous urine   . Muscle weakness   . Obesity   . Oxygen dependent   . Renal cyst   . Sensory urge incontinence   . Thyroid activity decreased    9/19  . Tobacco abuse   . Wheezing    Past Surgical History:  Procedure Laterality Date  . ABDOMINAL HYSTERECTOMY    . CARPAL TUNNEL RELEASE Left 2012  . EPIGASTRIC HERNIA REPAIR N/A 08/13/2018   HERNIA REPAIR EPIGASTRIC ADULT; polypropylene mesh reinforcement.  Surgeon: Robert Bellow, MD;  Location: [facility] ORS;  Service: General;  Laterality: N/A;  . RIGHT/LEFT HEART CATH AND CORONARY ANGIOGRAPHY N/A 07/11/2019   Procedure: RIGHT/LEFT HEART CATH AND CORONARY ANGIOGRAPHY;  Surgeon: Yolonda Kida, MD;  Location: [facility];  Service: Cardiovascular;  Laterality: N/A;  . TONSILLECTOMY    . TUBAL LIGATION    . UMBILICAL HERNIA REPAIR N/A 08/13/2018   HERNIA REPAIR UMBILICAL ADULT; polypropylene mesh reinforcement Surgeon: Robert Bellow, MD;  Location: [facility];  Service: General;  Laterality: N/A;   Family History  Problem Relation Age of Onset  . Heart disease Mother   . Stroke Mother   . Coronary artery disease Mother   . Lung cancer Sister   . Cancer Sister   . Breast cancer Sister   . Alcohol abuse Father    . Heart disease Father   . Cancer Brother   . Cancer Brother   . Pneumonia Brother   . Prostate cancer Neg Hx   . Kidney cancer Neg Hx   . Bladder Cancer Neg Hx    Social History   Socioeconomic History  . Marital status: Married    Spouse name: Ovid Curd  . Number of children: 2  . Years of education: Not on file  . Highest education level: 12th grade  Occupational History  . Occupation: disability  Social Needs  . Financial resource strain: Somewhat hard  . Food insecurity    Worry: Never true    Inability: Never true  . Transportation needs    Medical: No    Non-medical: No  Tobacco Use  . Smoking status: Current Every Day Smoker    Packs/day: 1.00    Years: 44.00    Pack years: 44.00    Types: Cigarettes  . Smokeless tobacco: Never Used  Substance and Sexual Activity  . Alcohol use: No    Frequency: Never  . Drug use: No  . Sexual activity: Not Currently    Birth control/protection: Surgical  Lifestyle  . Physical activity    Days per week: 0 days  Minutes per session: 0 min  . Stress: Rather much  Relationships  . Social Herbalist on phone: Patient refused    Gets together: Patient refused    Attends religious service: Patient refused    Active member of club or organization: Patient refused    Attends meetings of clubs or organizations: Patient refused    Relationship status: Patient refused  . Intimate partner violence    Fear of current or ex partner: Patient refused    Emotionally abused: Patient refused    Physically abused: Patient refused    Forced sexual activity: Patient refused  Other Topics Concern  . Not on file  Social History Narrative   ** Merged History Encounter **       Lives with spouse     Outpatient Encounter Medications as of 07/18/2019  Medication Sig  . ALPRAZolam (XANAX) 0.5 MG tablet Take 1 tablet (0.5 mg total) by mouth 2 (two) times daily as needed for anxiety. (Patient taking differently: Take 0.5 mg by  mouth daily as needed for anxiety. )  . aspirin EC 81 MG tablet Take 81 mg by mouth daily.  . baclofen (LIORESAL) 10 MG tablet Take 0.5 tablets (5 mg total) by mouth 2 (two) times daily as needed for muscle spasms.  . bisoprolol-hydrochlorothiazide (ZIAC) 10-6.25 MG tablet TAKE 1 TABLET BY MOUTH DAILY  . Bromfenac Sodium (PROLENSA) 0.07 % SOLN Place 1 drop into the left eye at bedtime.  . Cholecalciferol ([employer] D3) 2000 units CAPS Take 2,000 Units by mouth daily.   . digoxin (LANOXIN) 0.25 MG tablet Take 1 tablet (0.25 mg total) by mouth daily.  . Fluticasone-Umeclidin-Vilant 100-62.5-25 MCG/INH AEPB Inhale 1 puff into the lungs daily.  Marland Kitchen gabapentin (NEURONTIN) 300 MG capsule Take 1 capsule (300 mg total) by mouth 4 (four) times daily.  Marland Kitchen ibuprofen (ADVIL,MOTRIN) 200 MG tablet Take 1,200 mg by mouth every 6 (six) hours as needed for headache or moderate pain.  Marland Kitchen ipratropium-albuterol (DUONEB) 0.5-2.5 (3) MG/3ML SOLN USE 1 VIAL VIA NEBULIZER EVERY 6 HOURS AS NEEDED FOR SHORTNESS OF BREATH OR WHEEZING (Patient taking differently: Take 3 mLs by nebulization every 4 (four) hours as needed (shortness of breat / wheezing). )  . levothyroxine (SYNTHROID) 25 MCG tablet Take 1 tablet (25 mcg total) by mouth daily before breakfast. DUPLICATE ENTRY  . lidocaine (LIDODERM) 5 % Place 1 patch onto the skin daily. Remove & Discard patch within 12 hours or as directed by MD  . polyethylene glycol (MIRALAX / GLYCOLAX) 17 g packet Take 17 g by mouth 2 (two) times daily.  . potassium chloride SA (K-DUR,KLOR-CON) 20 MEQ tablet Take 1 tablet (20 mEq total) by mouth 2 (two) times daily.  . sertraline (ZOLOFT) 100 MG tablet TAKE 2 TABLETS(200 MG) BY MOUTH EVERY MORNING (Patient taking differently: Take 200 mg by mouth daily. )  . torsemide (DEMADEX) 20 MG tablet Take 2 tablets (40 mg total) by mouth 2 (two) times daily.  . VENTOLIN HFA 108 (90 Base) MCG/ACT inhaler Inhale 2 puffs into the lungs every 6 (six) hours as needed  for shortness of breath.  Marland Kitchen azithromycin (ZITHROMAX) 250 MG tablet Take 1 tablet (250 mg total) by mouth daily. (Patient not taking: Reported on 07/18/2019)  . HYDROcodone-acetaminophen (NORCO/VICODIN) 5-325 MG tablet Take 1 tablet by mouth every 8 (eight) hours as needed for severe pain. Must last 30 days (Patient taking differently: Take 1 tablet by mouth every 6 (six) hours as needed for  severe pain. Must last 30 days)   No facility-administered encounter medications on file as of 07/18/2019.     Allergies  Allergen Reactions  . Codeine Nausea And Vomiting  . Meloxicam Other (See Comments)    Stomach pain    Review of Systems  Constitutional: Positive for diaphoresis. Negative for chills, fever and malaise/fatigue.  HENT: Negative for congestion, ear pain, sinus pain and sore throat.   Respiratory: Positive for cough, sputum production (clear phlegm) and shortness of breath (currently on 2 lpm of O2). Negative for wheezing.   Cardiovascular: Positive for palpitations. Negative for chest pain and leg swelling.  Musculoskeletal: Positive for back pain, falls and myalgias. Negative for joint pain and neck pain.     Fall Risk  07/18/2019 05/02/2018 11/27/2017 11/08/2017 11/08/2017  Falls in the past year? 1 - No - Yes  Comment - - - - -  Number falls in past yr: 1 - - 2 or more 2 or more  Injury with Fall? 0 - - - No  Comment - scrapes - - -  Risk Factor Category  - - - - -  Risk for fall due to : - - - History of fall(s) -  Risk for fall due to: Comment - "Im not sure" - - -  Follow up - - - - -  Some encounter information is confidential and restricted. Go to Review Flowsheets activity to see all data.   Patient states she has fallen 9 times in the last 3 days.  States her leg gives out.  No injuries just bruising.   Objective:  BP (!) 100/52 (BP Location: Right Arm, Patient Position: Sitting, Cuff Size: Normal)   Pulse 81   Temp (!) 97.1 F (36.2 C) (Skin)   SpO2 93%  (on 2 LPM in  wheel chair today)  Physical Exam  Constitutional: She is oriented to person, place, and time and well-developed, well-nourished, and in no distress.  HENT:  Head: Normocephalic.  Eyes: Conjunctivae are normal.  Neck: Neck supple.  Cardiovascular: Normal rate and regular rhythm.  Pulmonary/Chest: Effort normal.  Distant breath sounds.  Abdominal: Soft. Bowel sounds are normal.  Musculoskeletal:        General: Tenderness present.     Comments: Pain in left thigh to palpation. Sharp pain in thigh to externally rotate hip. DTR's 1+ left knee jerk and 2+ right knee jerk. SLR's 80 degrees. Weakness in thigh to attempt to stand. Also, tender to palpate left lower back near SI joint.  Neurological: She is alert and oriented to person, place, and time.  Skin: No rash noted.  Psychiatric: Mood, affect and judgment normal.    Assessment and Plan :   1. Left thigh pain Tenderness and tight pain in the left thigh the past couple weeks without known injury. History of lumbar disc herniation. Baclofen increased to 10 mg TID for spasm in thigh. History of bilateral hip arthritis. Disc protrusions and foraminal changes on lumbar MRI. Schedule neurosurgical evaluation. May need orthopedic evaluation, also. - baclofen (LIORESAL) 10 MG tablet; Take 1 tablet (10 mg total) by mouth 3 (three) times daily as needed for muscle spasms.  Dispense: 30 tablet; Refill: 0 - Ambulatory referral to Neurosurgery  2. 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 MRI on 08-31-18 showed  - 1. New right foraminal disc protrusion at L5-S1 resulting in severe foraminal stenosis and L5 nerve root impingement. 2.  Decreased size of left foraminal disc extrusion at L4-5. 3. Stable to minimally progressive disc degeneration at L3-4 with at most mild neural foraminal stenosis. Will schedule neurosurgical evaluation with thigh pain and weakness. May  need wheel chair for transport outside of the home. Continues to use a walker around the house. - Ambulatory referral to Neurosurgery  3. COPD exacerbation ([place]) Hospitalized on 07-06-19 with wheezing, cough and hypoxia despite use of 3 LPM of oxygen (respiration rate still 24 on admission with pulse oximetry 92% at discharge). Has a history of CHF with acute on chronic respiratory failure and hypoxia. Discharged on 07-13-19 continues to smoke 1ppd  4. Chronic diastolic heart failure (HCC) Echocardiogram on 07-07-19 showed EF of 60-65%. No significant edema. Evaluated by cardiologist (Dr. Clayborn Bigness) and placed on Digoxin 0.25 mg qd and Torsemide 40 mg BID as needed. Followed at CHF clinic prn..  5. Acute on chronic respiratory failure with hypoxia (HCC) Continues oxygen by nasal cannula at 2-3 LPM pending level of hypoxia. Still using Ventolin-HFA prn wheeze, Duoneb 1 vial by nebulizer QID pr and, Trelegy 1 puff qd. Difficulty ambulating with weakness and dyspnea.

## 2019-07-21 IMAGING — CT CT CHEST LUNG CANCER SCREENING LOW DOSE
2 of 5 series · 15 of 40 positions shown, 18 images · non-contrast
Comparison: Chest CT 02/01/2018.

CLINICAL DATA: 62-year-old female current smoker with 35 pack-year
history of smoking. Lung cancer screening examination.

EXAM:
CT CHEST WITHOUT CONTRAST LOW-DOSE FOR LUNG CANCER SCREENING
TECHNIQUE: Multidetector CT imaging of the chest was performed following the
standard protocol without IV contrast.

[Series 3: lung · axial · 0.69mm/px · z∈[-1236,-932]mm · 12 of 337 slices shown, 15 images (1 of 2)]
[im 17/337  mediastinal]
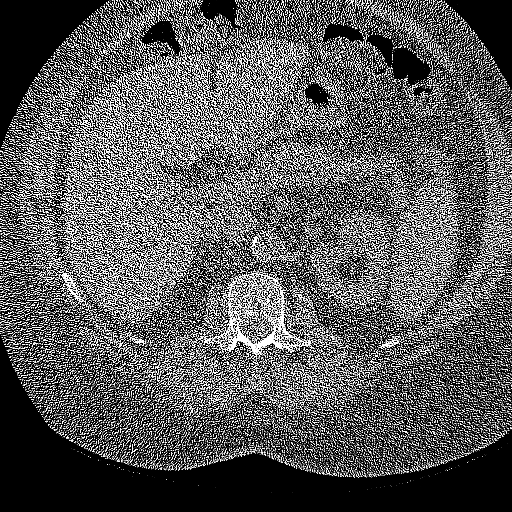
[im 17/337  lung]
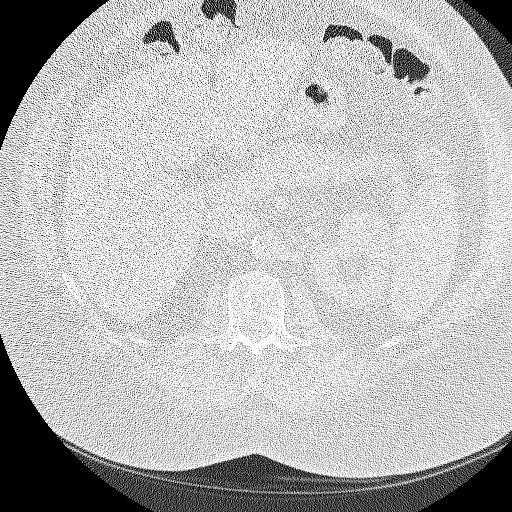
[im 49/337  lung]
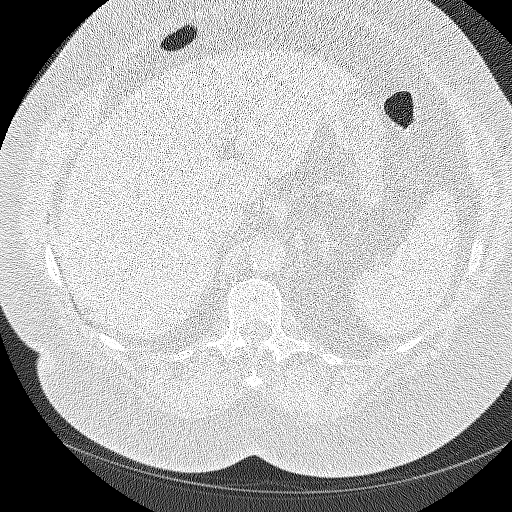
[im 81/337  lung]
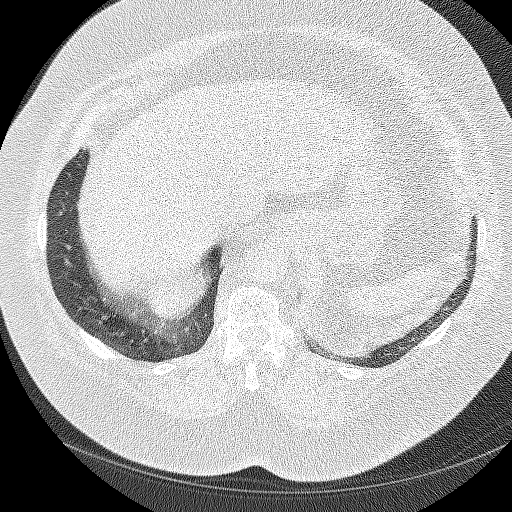
[im 97/337  lung]
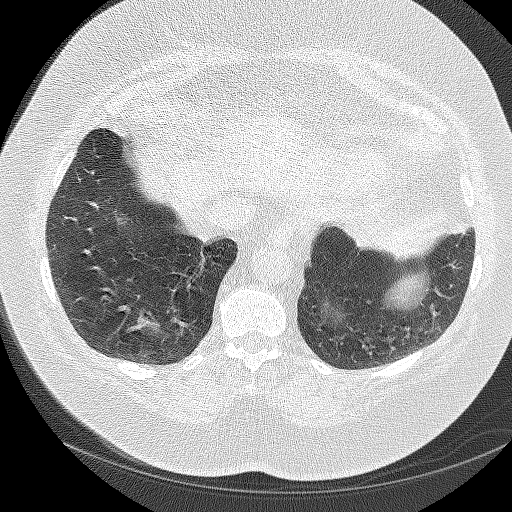
[im 129/337  mediastinal]
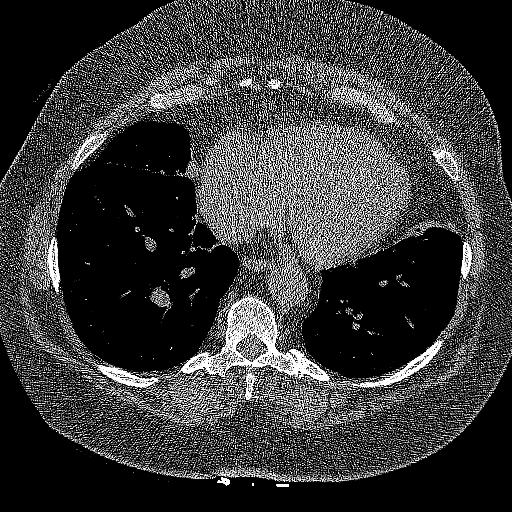
[im 129/337  lung]
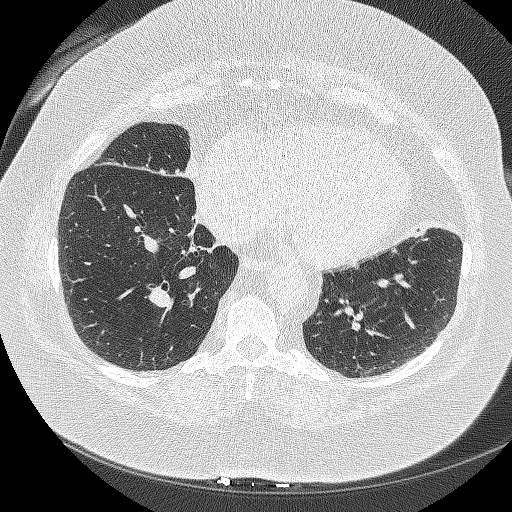
[im 161/337  lung]
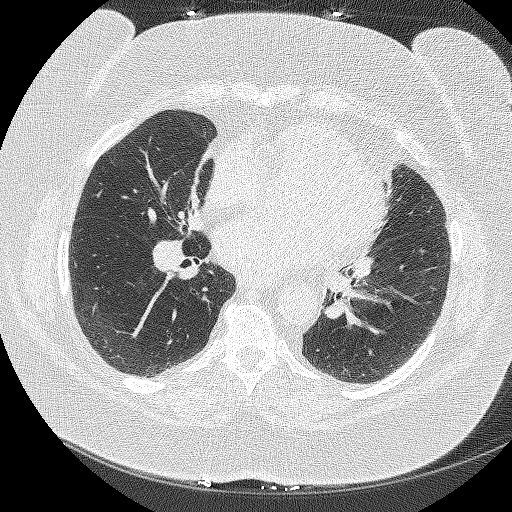
[im 177/337  lung]
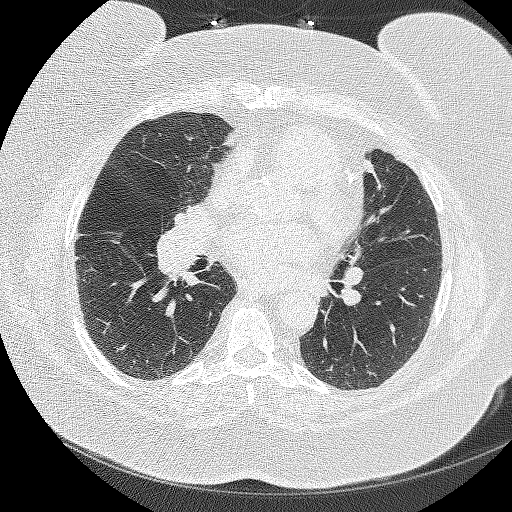
[im 209/337  lung]
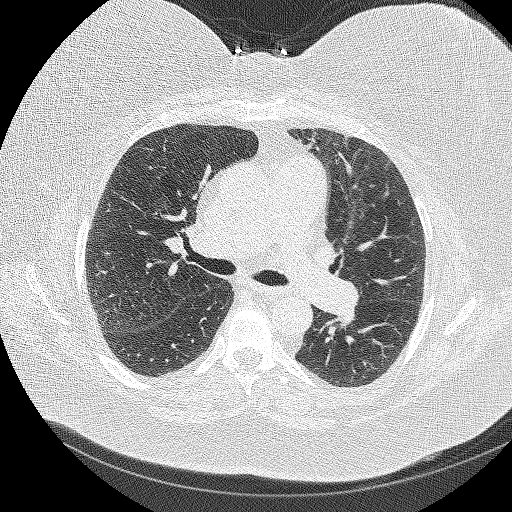
[im 241/337  mediastinal]
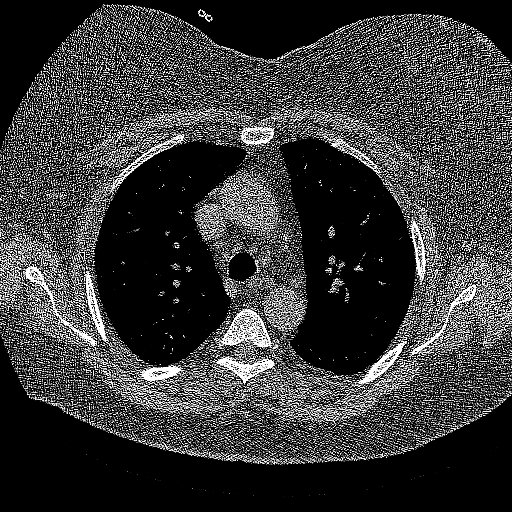
[im 241/337  lung]
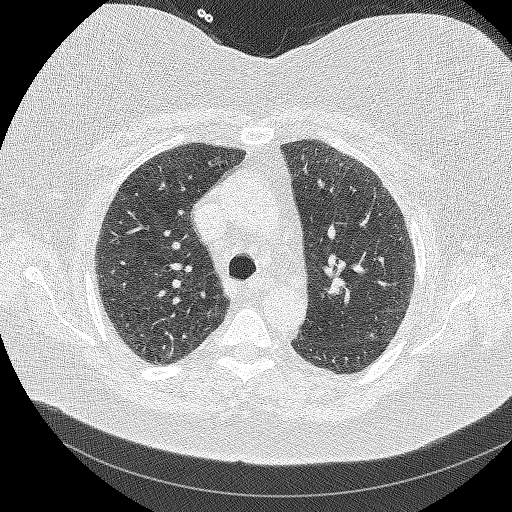
[im 257/337  lung]
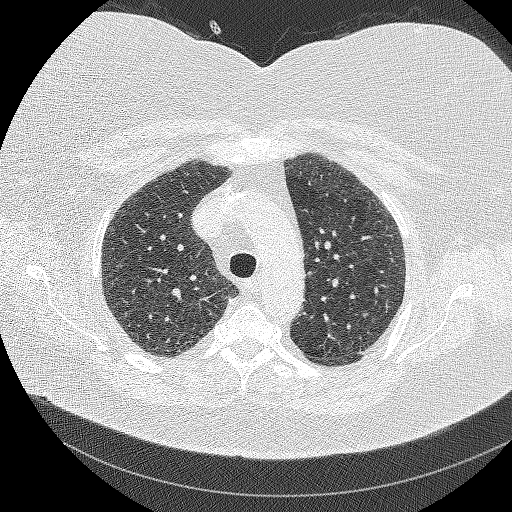
[im 289/337  lung]
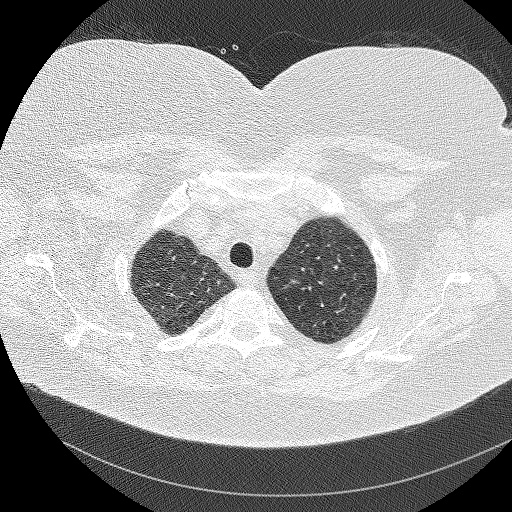
[im 321/337  lung]
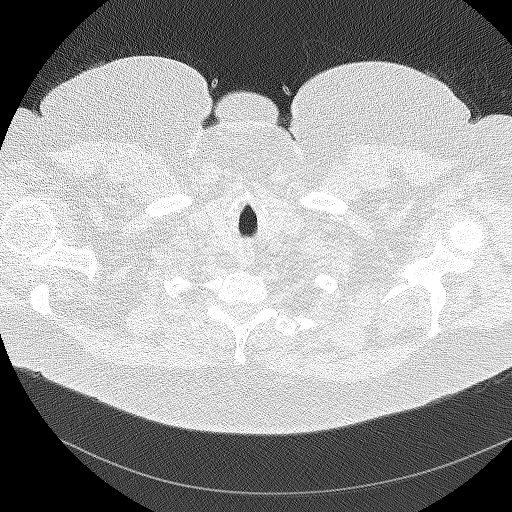

[Series 4: lung · coronal · 0.66mm/px · 3 of 352 slices shown (2 of 2)]
[im 71/352  lung]
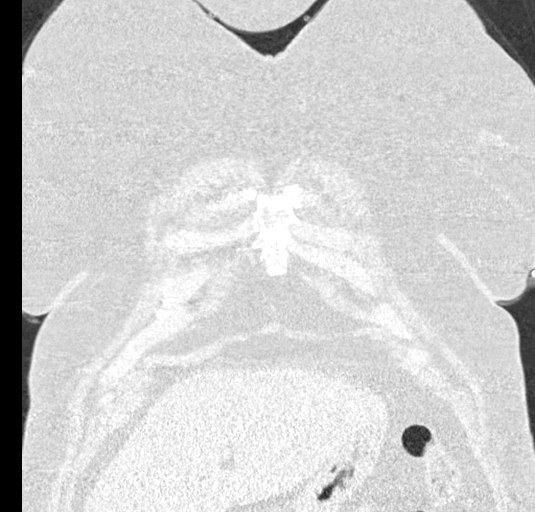
[im 141/352  lung]
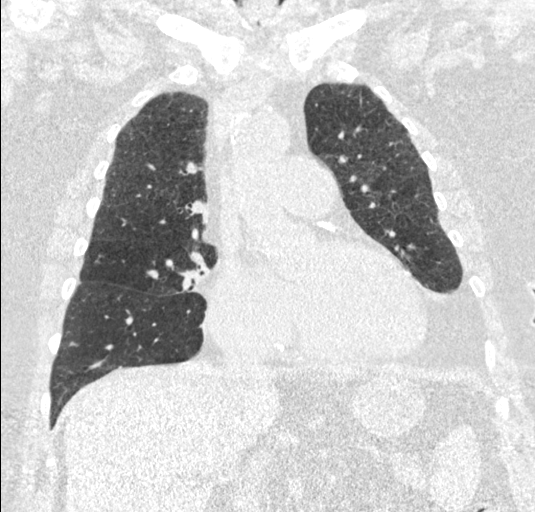
[im 211/352  lung]
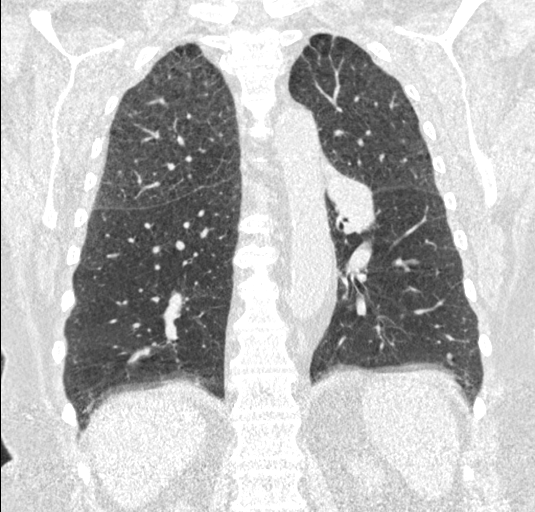

[15 of 40 positions shown; findings below may reference images not displayed]

FINDINGS: Cardiovascular: Heart size is normal. There is no significant
pericardial fluid, thickening or pericardial calcification. There is
aortic atherosclerosis, as well as atherosclerosis of the great
vessels of the mediastinum and the coronary arteries, including
calcified atherosclerotic plaque in the left main, left anterior
descending, left circumflex and right coronary arteries.

Mediastinum/Nodes: No pathologically enlarged mediastinal or hilar
lymph nodes. Please note that accurate exclusion of hilar adenopathy
is limited on noncontrast CT scans. Esophagus is unremarkable in
appearance. No axillary lymphadenopathy.

Lungs/Pleura: Multiple small pulmonary nodules are noted throughout
the lungs bilaterally, largest of which is in the periphery of the
right upper lobe (axial image 71 of series 3), with a volume derived
mean diameter of 3.9 mm. No larger more suspicious appearing
pulmonary nodules or masses are noted. No acute consolidative
airspace disease. No pleural effusions. Mild diffuse bronchial wall
thickening with mild centrilobular and paraseptal emphysema.

Upper Abdomen: Aortic atherosclerosis.

Musculoskeletal: There are no aggressive appearing lytic or blastic
lesions noted in the visualized portions of the skeleton.
IMPRESSION: 1. Lung-RADS 2S, benign appearance or behavior. Continue annual
screening with low-dose chest CT without contrast in 12 months.
2. The "S" modifier above refers to potentially clinically
significant non lung cancer related findings. Specifically, there is
aortic atherosclerosis, in addition to left main and 3 vessel
coronary artery disease. Please note that although the presence of
coronary artery calcium documents the presence of coronary artery
disease, the severity of this disease and any potential stenosis
cannot be assessed on this non-gated CT examination. Assessment for
potential risk factor modification, dietary therapy or pharmacologic
therapy may be warranted, if clinically indicated.
3. Mild diffuse bronchial wall thickening with mild centrilobular
and paraseptal emphysema; imaging findings suggestive of underlying
COPD.

Aortic Atherosclerosis (Q7CFP-RX5.5) and Emphysema (Q7CFP-2RG.E).

## 2019-07-22 ENCOUNTER — Telehealth: Payer: Self-pay | Admitting: Family Medicine

## 2019-07-22 DIAGNOSIS — E039 Hypothyroidism, unspecified: Secondary | ICD-10-CM | POA: Diagnosis not present

## 2019-07-22 DIAGNOSIS — F419 Anxiety disorder, unspecified: Secondary | ICD-10-CM | POA: Diagnosis not present

## 2019-07-22 DIAGNOSIS — G8929 Other chronic pain: Secondary | ICD-10-CM | POA: Diagnosis not present

## 2019-07-22 DIAGNOSIS — M5030 Other cervical disc degeneration, unspecified cervical region: Secondary | ICD-10-CM | POA: Diagnosis not present

## 2019-07-22 DIAGNOSIS — J9611 Chronic respiratory failure with hypoxia: Secondary | ICD-10-CM | POA: Diagnosis not present

## 2019-07-22 DIAGNOSIS — M5137 Other intervertebral disc degeneration, lumbosacral region: Secondary | ICD-10-CM | POA: Diagnosis not present

## 2019-07-22 DIAGNOSIS — J441 Chronic obstructive pulmonary disease with (acute) exacerbation: Secondary | ICD-10-CM | POA: Diagnosis not present

## 2019-07-22 DIAGNOSIS — I5032 Chronic diastolic (congestive) heart failure: Secondary | ICD-10-CM | POA: Diagnosis not present

## 2019-07-22 DIAGNOSIS — I110 Hypertensive heart disease with heart failure: Secondary | ICD-10-CM | POA: Diagnosis not present

## 2019-07-22 NOTE — Telephone Encounter (Signed)
Advised 

## 2019-07-22 NOTE — Telephone Encounter (Signed)
[facility] PT w/ [place]  [facility].  Can leave a message  Verbal Orders  2 times a week for 2 weeks 1 time a week for 4 weeks.  Please advise.  Thanks, [employer]

## 2019-07-23 ENCOUNTER — Emergency Department: Payer: Medicare HMO

## 2019-07-23 ENCOUNTER — Ambulatory Visit: Payer: Medicare HMO | Attending: Pain Medicine | Admitting: Pain Medicine

## 2019-07-23 ENCOUNTER — Emergency Department
Admission: EM | Admit: 2019-07-23 | Discharge: 2019-07-23 | Disposition: A | Discharge status: Home / Self Care | Payer: Medicare HMO | Attending: Emergency Medicine | Admitting: Emergency Medicine

## 2019-07-23 ENCOUNTER — Other Ambulatory Visit: Payer: Self-pay

## 2019-07-23 DIAGNOSIS — Y929 Unspecified place or not applicable: Secondary | ICD-10-CM | POA: Insufficient documentation

## 2019-07-23 DIAGNOSIS — M79604 Pain in right leg: Secondary | ICD-10-CM

## 2019-07-23 DIAGNOSIS — M25571 Pain in right ankle and joints of right foot: Secondary | ICD-10-CM | POA: Diagnosis not present

## 2019-07-23 DIAGNOSIS — Y93K1 Activity, walking an animal: Secondary | ICD-10-CM | POA: Diagnosis not present

## 2019-07-23 DIAGNOSIS — M48061 Spinal stenosis, lumbar region without neurogenic claudication: Secondary | ICD-10-CM

## 2019-07-23 DIAGNOSIS — M5136 Other intervertebral disc degeneration, lumbar region: Secondary | ICD-10-CM | POA: Diagnosis not present

## 2019-07-23 DIAGNOSIS — S99911A Unspecified injury of right ankle, initial encounter: Secondary | ICD-10-CM | POA: Diagnosis not present

## 2019-07-23 DIAGNOSIS — M47816 Spondylosis without myelopathy or radiculopathy, lumbar region: Secondary | ICD-10-CM | POA: Diagnosis not present

## 2019-07-23 DIAGNOSIS — Z7982 Long term (current) use of aspirin: Secondary | ICD-10-CM | POA: Insufficient documentation

## 2019-07-23 DIAGNOSIS — M5416 Radiculopathy, lumbar region: Secondary | ICD-10-CM

## 2019-07-23 DIAGNOSIS — S299XXA Unspecified injury of thorax, initial encounter: Secondary | ICD-10-CM | POA: Diagnosis not present

## 2019-07-23 DIAGNOSIS — I110 Hypertensive heart disease with heart failure: Secondary | ICD-10-CM | POA: Insufficient documentation

## 2019-07-23 DIAGNOSIS — Y999 Unspecified external cause status: Secondary | ICD-10-CM | POA: Insufficient documentation

## 2019-07-23 DIAGNOSIS — I509 Heart failure, unspecified: Secondary | ICD-10-CM | POA: Insufficient documentation

## 2019-07-23 DIAGNOSIS — M48062 Spinal stenosis, lumbar region with neurogenic claudication: Secondary | ICD-10-CM | POA: Diagnosis not present

## 2019-07-23 DIAGNOSIS — Z79899 Other long term (current) drug therapy: Secondary | ICD-10-CM | POA: Diagnosis not present

## 2019-07-23 DIAGNOSIS — J449 Chronic obstructive pulmonary disease, unspecified: Secondary | ICD-10-CM | POA: Diagnosis not present

## 2019-07-23 DIAGNOSIS — M79671 Pain in right foot: Secondary | ICD-10-CM | POA: Diagnosis not present

## 2019-07-23 DIAGNOSIS — M79605 Pain in left leg: Secondary | ICD-10-CM

## 2019-07-23 DIAGNOSIS — M5126 Other intervertebral disc displacement, lumbar region: Secondary | ICD-10-CM | POA: Diagnosis not present

## 2019-07-23 DIAGNOSIS — W19XXXA Unspecified fall, initial encounter: Secondary | ICD-10-CM | POA: Insufficient documentation

## 2019-07-23 DIAGNOSIS — M7989 Other specified soft tissue disorders: Secondary | ICD-10-CM | POA: Diagnosis not present

## 2019-07-23 DIAGNOSIS — M545 Low back pain: Secondary | ICD-10-CM | POA: Diagnosis not present

## 2019-07-23 DIAGNOSIS — F17210 Nicotine dependence, cigarettes, uncomplicated: Secondary | ICD-10-CM | POA: Insufficient documentation

## 2019-07-23 DIAGNOSIS — R296 Repeated falls: Secondary | ICD-10-CM | POA: Diagnosis not present

## 2019-07-23 DIAGNOSIS — M5417 Radiculopathy, lumbosacral region: Secondary | ICD-10-CM | POA: Diagnosis not present

## 2019-07-23 DIAGNOSIS — J9610 Chronic respiratory failure, unspecified whether with hypoxia or hypercapnia: Secondary | ICD-10-CM | POA: Diagnosis not present

## 2019-07-23 DIAGNOSIS — M549 Dorsalgia, unspecified: Secondary | ICD-10-CM | POA: Diagnosis not present

## 2019-07-23 DIAGNOSIS — G8929 Other chronic pain: Secondary | ICD-10-CM

## 2019-07-23 DIAGNOSIS — S99921A Unspecified injury of right foot, initial encounter: Secondary | ICD-10-CM | POA: Diagnosis not present

## 2019-07-23 MED ORDER — PREDNISONE 20 MG PO TABS
60.0000 mg | ORAL_TABLET | Freq: Once | ORAL | Status: AC
  Administered 2019-07-23: 60 mg via ORAL
  Filled 2019-07-23: qty 3

## 2019-07-23 MED ORDER — PREDNISONE 10 MG PO TABS: ORAL_TABLET | ORAL | 0 refills | Status: DC

## 2019-07-23 MED ORDER — BACLOFEN 10 MG PO TABS
10.0000 mg | ORAL_TABLET | Freq: Three times a day (TID) | ORAL | Status: DC
  Administered 2019-07-23: 10 mg via ORAL
  Filled 2019-07-23: qty 1

## 2019-07-23 MED ORDER — GADOBUTROL 1 MMOL/ML IV SOLN
10.0000 mL | Freq: Once | INTRAVENOUS | Status: AC | PRN
  Administered 2019-07-23: 10 mL via INTRAVENOUS
  Filled 2019-07-23: qty 10

## 2019-07-23 MED ORDER — BACLOFEN 10 MG PO TABS
5.0000 mg | ORAL_TABLET | Freq: Three times a day (TID) | ORAL | Status: DC
  Filled 2019-07-23: qty 0.5

## 2019-07-23 NOTE — ED Triage Notes (Addendum)
Pt states she was walking with her walker and her legs buckled under her and she landed on her right ankle and foot. Pt reports pain to those areas. Pt denies dizziness or passing out. Injury occurred 2 days ago. Pt using heat and ice at home.

## 2019-07-23 NOTE — ED Notes (Addendum)
See triage note  States she fell while walking her dog  No deformity noted   Good pulses  Pt has several bruises from several other falls

## 2019-07-23 NOTE — Progress Notes (Signed)
Pain Management Virtual Encounter Note - Virtual Visit via Telephone Telehealth (real-time audio visits between healthcare provider and patient).   Patient's Phone No. & Preferred Pharmacy:  [phone] (home); [phone] (mobile); (Preferred) [phone] [email or web address] [id number] [address], [address] [place] [address] Phone: [phone] Fax: [phone]    Pre-screening note:  Our staff contacted Theresa [name] and offered her an "in person", "face-to-face" appointment versus a telephone encounter. She indicated preferring the telephone encounter, at this time.   Reason for Virtual Visit: COVID-19*  Social distancing based on CDC and AMA recommendations.   I contacted Theresa [name] on 07/23/2019 via telephone.      I clearly identified myself as Gaspar Cola, MD. I verified that I was speaking with the correct person using two identifiers (Name: Theresa [name], and date of birth: May 19, 1956).  Advanced Informed Consent I sought verbal advanced consent from Theresa [name] for virtual visit interactions. I informed Theresa [name] of possible security and privacy concerns, risks, and limitations associated with providing "not-in-person" medical evaluation and management services. I also informed Theresa [name] of the availability of "in-person" appointments. Finally, I informed her that there would be a charge for the virtual visit and that she could be  personally, fully or partially, financially responsible for it. Theresa [name] expressed understanding and agreed to proceed.   Historic Elements   Theresa [name] is a 63 y.o. year old, female patient evaluated today after her last encounter by our practice on 07/16/2019. Ms. Alan  has a past medical history of Acute postoperative pain (10/03/2017), Anxiety, BiPAP (biphasic positive airway pressure) dependence, Carpal tunnel syndrome, CHF (congestive  heart failure) ([place]), CHF (congestive heart failure) ([place]), Chronic generalized abdominal pain, Chronic rhinitis, COPD (chronic obstructive pulmonary disease) ([place]), DDD (degenerative disc disease), cervical, DDD (degenerative disc disease), lumbosacral, Depression, Dyspnea, Edema, Flu, Gastritis, GERD (gastroesophageal reflux disease), Hematuria, Hernia of abdominal wall (07/17/2018), Hernia, abdominal, Hypertension, Kidney stones, Low back pain, Lumbar radiculitis, Malodorous urine, Muscle weakness, Obesity, Oxygen dependent, Renal cyst, Sensory urge incontinence, Thyroid activity decreased, Tobacco abuse, and Wheezing. She also  has a past surgical history that includes Abdominal hysterectomy; Tonsillectomy; Carpal tunnel release (Left, 2012); Tubal ligation; epigastric hernia repair (N/A, 08/13/2018); Umbilical hernia repair (N/A, 08/13/2018); and RIGHT/LEFT HEART CATH AND CORONARY ANGIOGRAPHY (N/A, 07/11/2019). Theresa [name] has a current medication list which includes the following prescription(s): alprazolam, aspirin ec, azithromycin, baclofen, bisoprolol-hydrochlorothiazide, prolensa, cholecalciferol, digoxin, fluticasone-umeclidin-vilant, gabapentin, hydrocodone-acetaminophen, ibuprofen, ipratropium-albuterol, levothyroxine, lidocaine, polyethylene glycol, potassium chloride sa, sertraline, torsemide, and ventolin hfa. She  reports that she has been smoking cigarettes. She has a 44.00 pack-year smoking history. She has never used smokeless tobacco. She reports that she does not drink alcohol or use drugs. Theresa [name] is allergic to codeine and meloxicam.   HPI  Today, she is being contacted for worsening of previously known (established) problem.  The patient indicates having more problems with falls.  She has a right-sided L5-S1 and a left-sided L4-5 foraminal stenosis.  She has been experiencing falls secondary to her legs giving out.  She is a severe COPD patient and she was recently admitted to the  hospital due to shortness of breath.  Unfortunately while she was there, she developed sciatica of the left lower extremity.  Even though she was on steroids at the time secondary to her COPD, this did not appear to help her leg pain.  She describes the pain as being primarily over the area of the anterior thigh on the left leg and going down to the area of the knee, but not below.  Today I had her do several physical exam maneuvers at home and she could be having a meralgia paresthetica, but the MRI that she had done on 08/31/2018 does have evidence of severe foraminal stenosis secondary to disc protrusions and extrusions that could be affecting the patient's ability to ambulate secondary to the bilateral radiculopathy.  I will plan to bring this patient in for a possible lumbar epidural steroid injection to see if that provides her with some relief of the pain.  Pharmacotherapy Assessment  Analgesic: Hydrocodone/APAP 5/325 one tablet every 8 hours (15 mg/day of hydrocodone) MME/day:15 mg/day.   Monitoring: Pharmacotherapy: No side-effects or adverse reactions reported. [place] PMP: PDMP reviewed during this encounter.       Compliance: No problems identified. Effectiveness: Clinically acceptable. Plan: Refer to "POC".  UDS:  Summary  Date Value Ref Range Status  07/31/2018 FINAL  Final    Comment:    ==================================================================== TOXASSURE SELECT 13 (MW) ==================================================================== Test                             Result       Flag       Units Drug Present and Declared for Prescription Verification   Hydrocodone                    688          EXPECTED   ng/mg creat   Norhydrocodone                 552          EXPECTED   ng/mg creat    Sources of hydrocodone include scheduled prescription    medications. Norhydrocodone is an expected metabolite of    hydrocodone. Drug Absent but Declared for Prescription  Verification   Alprazolam                     Not Detected UNEXPECTED ng/mg creat ==================================================================== Test                      Result    Flag   Units      Ref Range   Creatinine              25               mg/dL      >=20 ==================================================================== Declared Medications:  The flagging and interpretation on this report are based on the  following declared medications.  Unexpected results may arise from  inaccuracies in the declared medications.  **Note: The testing scope of this panel includes these medications:  Alprazolam  Hydrocodone (Hydrocodone-Acetaminophen)  **Note: The testing scope of this panel does not include following  reported medications:  Acetaminophen (Hydrocodone-Acetaminophen)  Albuterol  Albuterol (Ipratropium-Albuterol)  Aspirin (Aspirin 81)  Bisoprolol (Ziac)  Fluticasone (Advair)  Gabapentin  Hydrochlorothiazide (Ziac)  Ipratropium (Ipratropium-Albuterol)  Levothyroxine  Oxygen  Potassium (K-Dur)  Ranitidine  Roflumilast  Salmeterol (Advair)  Sertraline  Torsemide ==================================================================== For clinical consultation, please call [phone]. ====================================================================    Laboratory Chemistry Profile (12 mo)  Renal: 11/16/2018: BUN/Creatinine Ratio 24 07/14/2019: BUN 31; Creatinine, Ser 0.75  Lab Results  Component Value Date   GFRAA >60 07/14/2019  GFRNONAA >60 07/14/2019   Hepatic: 11/16/2018: Albumin 4.3 Lab Results  Component Value Date   AST 20 11/16/2018   ALT 20 11/16/2018   Other: No results found for requested labs within last 8760 hours. Note: Above Lab results reviewed.  Imaging  Last 90 days:  Dg Chest 1 View  Result Date: 07/06/2019 CLINICAL DATA:  Shortness of breath, chest pain EXAM: CHEST  1 VIEW COMPARISON:  02/07/2018 FINDINGS: Cardiomegaly.  Mild, diffuse bilateral interstitial pulmonary opacity. The visualized skeletal structures are unremarkable. IMPRESSION: Cardiomegaly with mild, diffuse bilateral interstitial pulmonary opacity, likely mild edema. No focal airspace opacity. Electronically Signed   By: Eddie Candle M.D.   On: 07/06/2019 15:15   Dg Lumbar Spine 2-3 Views  Result Date: 07/16/2019 CLINICAL DATA:  63 year old female with back pain. EXAM: LUMBAR SPINE - 2-3 VIEW; DG HIP (WITH OR WITHOUT PELVIS) 2-3V LEFT COMPARISON:  Left hip radiograph dated 12/29/2015 FINDINGS: There is no acute fracture or subluxation the lumbar spine. Multilevel degenerative changes with disc space narrowing and endplate irregularity most prominent involving the lower lumbar at L3-L4, L4-L5 and L5-S1. Lower lumbar facet arthropathy with probable associated narrowing of the neural foramina at L5-S1. The visualized posterior elements are intact. There is no acute fracture or subluxation of the hip or pelvis. Mild bilateral hip arthritic changes. The soft tissues are unremarkable. IMPRESSION: 1. No acute fracture or subluxation of the lumbar spine or pelvis/hips. 2. Multilevel degenerative changes of the spine and mild bilateral hip arthritic changes. Electronically Signed   By: Anner Crete M.D.   On: 07/16/2019 18:47   Dg Abd 1 View  Result Date: 07/13/2019 CLINICAL DATA:  Constipation. EXAM: ABDOMEN - 1 VIEW COMPARISON:  None. FINDINGS: No gaseous small bowel dilatation to suggest obstruction. Large stool volume evident. Phleboliths overlie the inferior pelvis bilaterally. No suspicious lytic or sclerotic osseous abnormality. IMPRESSION: Large stool volume compatible with the reported clinical history of constipation. Electronically Signed   By: Misty Stanley M.D.   On: 07/13/2019 09:49   Ct Angio Chest Pe W Or Wo Contrast  Result Date: 07/07/2019 CLINICAL DATA:  Chest pain and worsening shortness of breath for 2 days. History of wheezing,  hypertension, COPD, CHF, smoker. EXAM: CT ANGIOGRAPHY CHEST WITH CONTRAST TECHNIQUE: Multidetector CT imaging of the chest was performed using the standard protocol during bolus administration of intravenous contrast. Multiplanar CT image reconstructions and MIPs were obtained to evaluate the vascular anatomy. CONTRAST:  62m OMNIPAQUE IOHEXOL 350 MG/ML SOLN COMPARISON:  Chest CT dated 03/25/2019. FINDINGS: Cardiovascular: Some of the peripheral segmental and subsegmental pulmonary artery branches are difficult to definitively characterize due to patient breathing motion artifact, however, there is no pulmonary embolism identified within the main, lobar or segmental pulmonary arteries bilaterally. No thoracic aortic aneurysm or evidence of aortic dissection. Mild aortic atherosclerosis. Cardiomegaly. No pericardial effusion. Diffuse coronary artery calcifications, particularly dense within the LEFT anterior descending coronary artery. Mediastinum/Nodes: No mass or enlarged lymph nodes seen within the mediastinum or perihilar regions. Esophagus appears normal. Trachea appears normal. Lungs/Pleura: Mild bibasilar atelectasis/scarring. Mild centrilobular and paraseptal emphysema. Stable linear consolidations within the medial aspects of each lung suggesting chronic MAI. No evidence of acute pneumonia or pulmonary edema. Upper Abdomen: No acute findings on limited images of the upper abdomen. Previously described cysts within the liver, again too small to definitively characterize. Musculoskeletal: No acute or suspicious osseous finding. Mild degenerative spurring within the thoracic spine. Review of the MIP images confirms the above findings. IMPRESSION:  1. No pulmonary embolism seen, with mild study limitations detailed above. 2. Cardiomegaly. No pericardial effusion. 3. Diffuse coronary artery calcifications, particularly dense within the LEFT anterior descending coronary artery. Recommend correlation with any  possible associated cardiac symptoms. 4. Chronic/incidental lung findings detailed above. No evidence of active pneumonia or pulmonary edema. Aortic Atherosclerosis (ICD10-I70.0) and Emphysema (ICD10-J43.9). Electronically Signed   By: Franki Cabot M.D.   On: 07/07/2019 11:20   US Venous Img Lower  Left (dvt Study)  Result Date: 07/14/2019 CLINICAL DATA:  Left lower extremity pain. EXAM: Left LOWER EXTREMITY VENOUS DOPPLER ULTRASOUND TECHNIQUE: Gray-scale sonography with graded compression, as well as color Doppler and duplex ultrasound were performed to evaluate the lower extremity deep venous systems from the level of the common femoral vein and including the common femoral, femoral, profunda femoral, popliteal and calf veins including the posterior tibial, peroneal and gastrocnemius veins when visible. The superficial great saphenous vein was also interrogated. Spectral Doppler was utilized to evaluate flow at rest and with distal augmentation maneuvers in the common femoral, femoral and popliteal veins. COMPARISON:  None. FINDINGS: Contralateral Common Femoral Vein: Respiratory phasicity is normal and symmetric with the symptomatic side. No evidence of thrombus. Normal compressibility. Common Femoral Vein: No evidence of thrombus. Normal compressibility, respiratory phasicity and response to augmentation. Saphenofemoral Junction: No evidence of thrombus. Normal compressibility and flow on color Doppler imaging. Profunda Femoral Vein: No evidence of thrombus. Normal compressibility and flow on color Doppler imaging. Femoral Vein: No evidence of thrombus. Normal compressibility, respiratory phasicity and response to augmentation. Popliteal Vein: No evidence of thrombus. Normal compressibility, respiratory phasicity and response to augmentation. Calf Veins: No evidence of thrombus. Normal compressibility and flow on color Doppler imaging. Venous Reflux:  None. Other Findings:  None. IMPRESSION: No evidence of  deep venous thrombosis. Electronically Signed   By: Marijo Conception M.D.   On: 07/14/2019 13:20   Dg Knee Complete 4 Views Left  Result Date: 07/16/2019 CLINICAL DATA:  Left knee pain. EXAM: LEFT KNEE - COMPLETE 4+ VIEW COMPARISON:  None. FINDINGS: No evidence of fracture, dislocation, or joint effusion. Curvilinear osseous density about the medial femoral metaphysis may represent prior MCL injury. No evidence of arthropathy or other focal bone abnormality. There are vascular calcifications. Soft tissues are unremarkable. IMPRESSION: 1. No acute abnormality. 2. Peripheral vascular calcifications. Electronically Signed   By: Keith Rake M.D.   On: 07/16/2019 20:01   Dg Hip Unilat W Or Wo Pelvis 2-3 Views Left  Result Date: 07/16/2019 CLINICAL DATA:  63 year old female with back pain. EXAM: LUMBAR SPINE - 2-3 VIEW; DG HIP (WITH OR WITHOUT PELVIS) 2-3V LEFT COMPARISON:  Left hip radiograph dated 12/29/2015 FINDINGS: There is no acute fracture or subluxation the lumbar spine. Multilevel degenerative changes with disc space narrowing and endplate irregularity most prominent involving the lower lumbar at L3-L4, L4-L5 and L5-S1. Lower lumbar facet arthropathy with probable associated narrowing of the neural foramina at L5-S1. The visualized posterior elements are intact. There is no acute fracture or subluxation of the hip or pelvis. Mild bilateral hip arthritic changes. The soft tissues are unremarkable. IMPRESSION: 1. No acute fracture or subluxation of the lumbar spine or pelvis/hips. 2. Multilevel degenerative changes of the spine and mild bilateral hip arthritic changes. Electronically Signed   By: Anner Crete M.D.   On: 07/16/2019 18:47   Assessment  The primary encounter diagnosis was Chronic low back pain (Primary Area of Pain) (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). 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  Plan of Care  I am having Theresa [name] maintain her Cholecalciferol, ALPRAZolam, Ventolin HFA, ibuprofen, potassium chloride SA, Fluticasone-Umeclidin-Vilant, sertraline, ipratropium-albuterol, HYDROcodone-acetaminophen, gabapentin, bisoprolol-hydrochlorothiazide, levothyroxine, Prolensa, aspirin EC, digoxin, torsemide, polyethylene glycol, lidocaine, azithromycin, and baclofen.  Pharmacotherapy (Medications Ordered): No orders of the defined types were placed in this encounter.  Orders:  Orders Placed This Encounter  Procedures  . Lumbar Epidural Injection    Standing Status:   Future    Standing Expiration Date:   08/23/2019    Scheduling Instructions:     Procedure: Interlaminar Lumbar Epidural Steroid injection (LESI)  L4-5     Laterality: Left-sided     Sedation: Patient's choice.     Timeframe: ASAA    Order Specific Question:   Where will this procedure be performed?    Answer:   [facility]   Follow-up plan:   Return for Procedure (no sedation): (L) L4-5 LESI, (ASAP).     Interventional management options: Planned, scheduled, and/or pending: NOTE: NO Lumbar RFAuntil BMI <35.    Considering: Diagnostic left L4-5 LESI    Palliative PRN treatment(s): Palliativebilaterallumbar facetblocks Palliative left L4-5 LESI + left L4TFESI PalliativerightL4-5 LESI+ rightL4 TFESI Palliative leftlumbar facet RFA#2(done on 06/27/2017) Palliative right lumbar facet RFA#2(done on 10/03/2017)    Recent Visits Date Type Provider Dept  05/06/19 Office Visit Milinda Pointer, MD [facility]  Showing recent visits within past 90 days and meeting all other  requirements   Today's Visits Date Type Provider Dept  07/23/19 Office Visit Milinda Pointer, MD [facility]  Showing today's visits and meeting all other requirements   Future Appointments Date Type Provider Dept  08/14/19 Appointment Milinda Pointer, MD [facility]  Showing future appointments within next 90 days and meeting all other requirements   I discussed the assessment and treatment plan with the patient. The patient was provided an opportunity to ask questions and all were answered. The patient agreed with the plan and demonstrated an understanding of the instructions.  Patient advised to call back or seek an in-person evaluation if the symptoms or condition worsens.  Total duration of non-face-to-face encounter: 15 minutes.  Note by: Gaspar Cola, MD Date: 07/23/2019; Time: 3:01 PM  Note: This dictation was prepared with Dragon dictation. Any transcriptional errors that may result from this process are unintentional.  Disclaimer:  * Given the special circumstances of the COVID-19 pandemic, the federal government has announced that the Office for Civil Rights (OCR) will exercise its enforcement discretion and will not impose penalties on physicians using telehealth in the event of noncompliance with regulatory requirements under the [place] (HIPAA) in connection with the good faith provision of telehealth during the UXNAT-55 national public health emergency. ([place])

## 2019-07-23 NOTE — Discharge Instructions (Signed)
Your ankle and foot x-ray do not show any broken bones but do show swelling.  Please keep your foot Ace wrapped and wear postop shoe.  Your MRI of your back shows a large herniated disc that is likely contributing to your symptoms.  Please use your walker and do not walk without assistance.  Please call primary care in the morning for a follow-up appointment this week.  Please call neurosurgery for an appointment for followup too.

## 2019-07-23 NOTE — ED Provider Notes (Signed)
[facility] Emergency Department Provider Note  ____________________________________________  Time seen: Approximately 11:42 PM  I have reviewed the triage vital signs and the nursing notes.   HISTORY  Chief Complaint Fall    HPI Theresa [name] is a 63 y.o. female that presents to the emergency department for evaluation of right foot and right ankle pain after a fall 2 days ago.  Patient states that her right knee buckled out from under her and her right foot went backwards underneath her.  Patient was seen  in the emergency department 1 week ago and was discharged home with a walker.  Patient states that she was using a walker from the hospital when she fell, but she since has started using her own personal walker and has been able to ambulate with this without falls or difficulty.  Patient saw her pain management doctor today and states that they are going to start epidural injections for her pain.  Patient states that she did not want to come the emergency room today but her pain doctor recommended that she come to have her foot evaluated from the fall and ensure that it is not broken.  Patient saw primary care on Friday last week and was referred to neurosurgery but did not receive a phone call for an appointment yet from neurosurgery.  No bowel or bladder dysfunction or saddle anesthesias.    Past Medical History:  Diagnosis Date  . Acute postoperative pain 10/03/2017  . Anxiety   . BiPAP (biphasic positive airway pressure) dependence    at hs  . Carpal tunnel syndrome   . CHF (congestive heart failure) ([place])    1/18  . CHF (congestive heart failure) ([place])   . Chronic generalized abdominal pain   . Chronic rhinitis   . COPD (chronic obstructive pulmonary disease) ([place])   . DDD (degenerative disc disease), cervical   . DDD (degenerative disc disease), lumbosacral   . Depression   . Dyspnea   . Edema   . Flu    1/18  . Gastritis   . GERD  (gastroesophageal reflux disease)   . Hematuria   . Hernia of abdominal wall 07/17/2018  . Hernia, abdominal   . Hypertension   . Kidney stones   . Low back pain   . Lumbar radiculitis   . Malodorous urine   . Muscle weakness   . Obesity   . Oxygen dependent   . Renal cyst   . Sensory urge incontinence   . Thyroid activity decreased    9/19  . Tobacco abuse   . Wheezing     Patient Active Problem List   Diagnosis Date Noted  . Acute on chronic respiratory failure with hypoxia ([place]) 07/06/2019  . Cervicalgia (Bilateral) (R>L) 09/24/2018  . Spondylosis without myelopathy or radiculopathy, lumbosacral region 09/24/2018  . Epigastric hernia 05/04/2018  . Chronic lower extremity pain (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) 01/25/2018  . Altered mental status, unspecified 12/09/2017  . Chronic hip pain [facility]) (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) 11/27/2017  . COPD (chronic obstructive pulmonary disease) ([place]) 08/11/2017  . Other long term (current) drug therapy 08/08/2017  . Disorder of bone, unspecified 08/08/2017  . COPD with hypoxia ([place]) 06/27/2017  . Arthralgia of acromioclavicular joint (Right) 06/15/2017  . Acromioclavicular joint DJD (Right) 06/15/2017  . 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  . Chronic shoulder pain (Left) 05/11/2017  . Elevated brain natriuretic peptide (BNP) level 05/11/2017  . Grief at loss of child 02/14/2017  . Lumbar facet  hypertrophy (multilevel) 11/17/2016  . Lumbar spinal stenosis (L4-5) 11/17/2016  . 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) 11/17/2016  . 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 Lumbosacral radiculopathy at L4 11/17/2016  . Supplemental oxygen dependent 08/01/2016  . Sleep apnea 06/02/2016  . Long term current use of opiate analgesic 01/12/2016  . Long term prescription opiate use 01/12/2016  . Opiate use (15 MME/Day) 01/12/2016  . Lumbar facet syndrome (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) 01/12/2016  . Chronic sacroiliac  joint pain (Bilateral) 01/12/2016  . Vitamin D deficiency 12/30/2015  . 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 Obesity, Class III, BMI 40-49.9 (morbid obesity) ([place]) 12/28/2015  . 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[place]) 11/19/2015  . Coagulation disorder ([place]) 10/22/2015  . Chronic pain syndrome (significant psychosocial component) 09/21/2015  . 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 09/21/2015  . Encounter for therapeutic drug level monitoring 09/16/2015  . Encounter for chronic pain management 09/16/2015  . Pain management 09/16/2015  . Neurogenic pain 09/16/2015  . Neuropathic pain 09/16/2015  . Musculoskeletal pain 09/16/2015  . Lumbar spondylosis (L4-5) 09/16/2015  . Chronic low back pain (Primary Area of Pain) (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) 09/16/2015  . Chronic lower extremity pain (Secondary Area of Pain) (Left) 09/16/2015  . Chronic lumbar radicular pain (L4 & S1 Dermatome) (Left) 09/16/2015  . Chronic shoulder pain (Right side) 09/16/2015  . Chronic upper extremity pain (Right-sided) 09/16/2015  . Cervical radiculitis (Right side) 09/16/2015  . Abnormal x-ray of lumbar spine 09/16/2015  . Chronic diastolic heart failure ([place]) 07/15/2015  . Chest pain 07/15/2015  . Tobacco abuse 07/15/2015  . Blood clotting disorder ([place]) 04/21/2015  . Decreased motor strength 07/16/2014  . Clinical depression 07/16/2014  . Chronic rhinitis 07/16/2014  . Carpal tunnel syndrome 07/16/2014  . Anxiety state 07/16/2014  . Neuritis or radiculitis due to rupture of lumbar intervertebral disc 07/02/2014  . DDD (degenerative disc disease), lumbar 07/02/2014  . CAFL (chronic airflow limitation) ([place]) 02/27/2014  . Essential (primary) hypertension 03/27/2008    Past Surgical History:  Procedure Laterality Date  . ABDOMINAL HYSTERECTOMY    . CARPAL TUNNEL RELEASE Left 2012  . EPIGASTRIC HERNIA REPAIR N/A 08/13/2018   HERNIA REPAIR EPIGASTRIC ADULT;  polypropylene mesh reinforcement.  Surgeon: Robert Bellow, MD;  Location: [facility] ORS;  Service: General;  Laterality: N/A;  . RIGHT/LEFT HEART CATH AND CORONARY ANGIOGRAPHY N/A 07/11/2019   Procedure: RIGHT/LEFT HEART CATH AND CORONARY ANGIOGRAPHY;  Surgeon: Yolonda Kida, MD;  Location: [facility];  Service: Cardiovascular;  Laterality: N/A;  . TONSILLECTOMY    . TUBAL LIGATION    . UMBILICAL HERNIA REPAIR N/A 08/13/2018   HERNIA REPAIR UMBILICAL ADULT; polypropylene mesh reinforcement Surgeon: Robert Bellow, MD;  Location: [facility] ORS;  Service: General;  Laterality: N/A;    Prior to Admission medications   Medication Sig Start Date End Date Taking? Authorizing Provider  ALPRAZolam Duanne Moron) 0.5 MG tablet Take 1 tablet (0.5 mg total) by mouth 2 (two) times daily as needed for anxiety. Patient taking differently: Take 0.5 mg by mouth daily as needed for anxiety.  12/09/16   [name], FNP  aspirin EC 81 MG tablet Take 81 mg by mouth daily.    [provider]  azithromycin (ZITHROMAX) 250 MG tablet Take 1 tablet (250 mg total) by mouth daily. Patient not taking: Reported on 07/18/2019 07/13/19   Bettey Costa, MD  baclofen (LIORESAL) 10 MG tablet Take 1 tablet (10 mg total) by  mouth 3 (three) times daily as needed for muscle spasms. 07/18/19 07/17/20  Chrismon, Vickki Muff, PA  bisoprolol-hydrochlorothiazide (ZIAC) 10-6.25 MG tablet TAKE 1 TABLET BY MOUTH DAILY 06/27/19   Chrismon, Vickki Muff, PA  Bromfenac Sodium (PROLENSA) 0.07 % SOLN Place 1 drop into the left eye at bedtime.    [provider]  Cholecalciferol (CVS D3) 2000 units CAPS Take 2,000 Units by mouth daily.     [provider]  digoxin (LANOXIN) 0.25 MG tablet Take 1 tablet (0.25 mg total) by mouth daily. 07/13/19   Bettey Costa, MD  Fluticasone-Umeclidin-Vilant 100-62.5-25 MCG/INH AEPB Inhale 1 puff into the lungs daily. 10/03/18   [provider]  gabapentin (NEURONTIN) 300 MG capsule  Take 1 capsule (300 mg total) by mouth 4 (four) times daily. 05/18/19 08/16/19  Milinda Pointer, MD  HYDROcodone-acetaminophen (NORCO/VICODIN) 5-325 MG tablet Take 1 tablet by mouth every 8 (eight) hours as needed for severe pain. Must last 30 days Patient taking differently: Take 1 tablet by mouth every 6 (six) hours as needed for severe pain. Must last 30 days 06/17/19 07/17/19  Milinda Pointer, MD  ibuprofen (ADVIL,MOTRIN) 200 MG tablet Take 1,200 mg by mouth every 6 (six) hours as needed for headache or moderate pain.    [provider]  ipratropium-albuterol (DUONEB) 0.5-2.5 (3) MG/3ML SOLN USE 1 VIAL VIA NEBULIZER EVERY 6 HOURS AS NEEDED FOR SHORTNESS OF BREATH OR WHEEZING Patient taking differently: Take 3 mLs by nebulization every 4 (four) hours as needed (shortness of breat / wheezing).  11/30/18   Chrismon, Vickki Muff, PA  levothyroxine (SYNTHROID) 25 MCG tablet Take 1 tablet (25 mcg total) by mouth daily before breakfast. DUPLICATE ENTRY 2/77/41   Chrismon, Simona Huh E, PA  lidocaine (LIDODERM) 5 % Place 1 patch onto the skin daily. Remove & Discard patch within 12 hours or as directed by MD 07/13/19   Bettey Costa, MD  polyethylene glycol (MIRALAX / GLYCOLAX) 17 g packet Take 17 g by mouth 2 (two) times daily. 07/13/19   Bettey Costa, MD  potassium chloride SA (K-DUR,KLOR-CON) 20 MEQ tablet Take 1 tablet (20 mEq total) by mouth 2 (two) times daily. 08/31/18   [name], FNP  predniSONE (DELTASONE) 10 MG tablet Take 6 tablets on day 1, take 5 tablets on day 2, take 4 tablets on day 3, take 3 tablets on day 4, take 2 tablets on day 5, take 1 tablet on day 6 07/23/19   Laban Emperor, PA-C  sertraline (ZOLOFT) 100 MG tablet TAKE 2 TABLETS(200 MG) BY MOUTH EVERY MORNING Patient taking differently: Take 200 mg by mouth daily.  11/16/18   Chrismon, Vickki Muff, PA  torsemide (DEMADEX) 20 MG tablet Take 2 tablets (40 mg total) by mouth 2 (two) times daily. 07/13/19   Bettey Costa, MD  VENTOLIN HFA  108 (90 Base) MCG/ACT inhaler Inhale 2 puffs into the lungs every 6 (six) hours as needed for shortness of breath. 02/10/18   Fritzi Mandes, MD    Allergies Codeine and Meloxicam  Family History  Problem Relation Age of Onset  . Heart disease Mother   . Stroke Mother   . Coronary artery disease Mother   . Lung cancer Sister   . Cancer Sister   . Breast cancer Sister   . Alcohol abuse Father   . Heart disease Father   . Cancer Brother   . Cancer Brother   . Pneumonia Brother   . Prostate cancer Neg Hx   . Kidney  cancer Neg Hx   . Bladder Cancer Neg Hx     Social History Social History   Tobacco Use  . Smoking status: Current Every Day Smoker    Packs/day: 1.00    Years: 44.00    Pack years: 44.00    Types: Cigarettes  . Smokeless tobacco: Never Used  Substance Use Topics  . Alcohol use: No    Frequency: Never  . Drug use: No     Review of Systems  Gastrointestinal:  No nausea, no vomiting.  Musculoskeletal: Positive for back and foot pain. Skin: Negative for rash, abrasions, lacerations. Positive for ecchymosis. Neurological: Negative for headaches, numbness or tingling   ____________________________________________   PHYSICAL EXAM:  VITAL SIGNS: ED Triage Vitals  Enc Vitals Group     BP 07/23/19 1638 (!) 97/58     Pulse Rate 07/23/19 1638 71     Resp 07/23/19 1638 20     Temp 07/23/19 1638 98.6 F (37 C)     Temp Source 07/23/19 1638 Oral     SpO2 07/23/19 1638 97 %     Weight 07/23/19 1634 245 lb (111.1 kg)     Height 07/23/19 1634 _0  (1.575 m)     Head Circumference --      Peak Flow --      Pain Score 07/23/19 1634 8     Pain Loc --      Pain Edu? --      Excl. in [place]? --      Constitutional: Alert and oriented. Well appearing and in no acute distress. Eyes: Conjunctivae are normal. PERRL. EOMI. Head: Atraumatic. ENT:      Ears:      Nose: No congestion/rhinnorhea. Wears oxygen.       Mouth/Throat: Mucous membranes are moist.  Neck:  No stridor.   Cardiovascular: Normal rate, regular rhythm.  Good peripheral circulation. Symmetric pedal pulses. Respiratory: Normal respiratory effort without tachypnea or retractions. Lungs CTAB. Good air entry to the bases with no decreased or absent breath sounds. Musculoskeletal: Full range of motion to all extremities. No gross deformities appreciated.  Strength equal in lower extremities bilaterally. Neurologic:  Normal speech and language. No gross focal neurologic deficits are appreciated.  Skin:  Skin is warm, dry and intact. Swelling to lateral malleolus. Ecchymosis to lateral inferior and medial inferior foot.  No ecchymosis to digits. Psychiatric: Mood and affect are normal. Speech and behavior are normal. Patient exhibits appropriate insight and judgement.   ____________________________________________   LABS (all labs ordered are listed, but only abnormal results are displayed)  Labs Reviewed - No data to display ____________________________________________  EKG   ____________________________________________  RADIOLOGY Robinette Haines, personally viewed and evaluated these images (plain radiographs) as part of my medical decision making, as well as reviewing the written report by the radiologist.  Dg Ankle Complete Right  Result Date: 07/23/2019 CLINICAL DATA:  Fall. Ankle pain and swelling.  Initial encounter. EXAM: RIGHT ANKLE - COMPLETE 3+ VIEW COMPARISON:  None. FINDINGS: Mild diffuse soft tissue swelling is noted. No evidence of ankle joint effusion. No evidence of acute fracture or dislocation. Prominent plantar calcaneal bone spur noted. IMPRESSION: Mild diffuse soft tissue swelling. No evidence of acute fracture or dislocation. Electronically Signed   By: Marlaine Hind M.D.   On: 07/23/2019 17:47   Mr Thoracic Spine W Wo Contrast  Result Date: 07/23/2019 CLINICAL DATA:  Back pain. Recent fall EXAM: MRI THORACIC WITHOUT AND WITH CONTRAST TECHNIQUE: Multiplanar and  multiecho pulse sequences of the thoracic spine were obtained without and with intravenous contrast. CONTRAST:  46m GADAVIST GADOBUTROL 1 MMOL/ML IV SOLN COMPARISON:  09/30/2010 FINDINGS: MRI THORACIC SPINE FINDINGS Alignment:  Physiologic. Vertebrae: T12 meningioma. No fracture Cord:  Normal signal and morphology. Paraspinal and other soft tissues: Negative. Disc levels: Small right subarticular disc protrusion at T7-T8 without associated stenosis. There is otherwise mild degenerative disc disease but no neural foraminal or spinal canal stenosis. IMPRESSION: 1. No acute abnormality of the thoracic spine. 2. Mild thoracic degenerative disc disease without spinal canal or neural foraminal stenosis. Electronically Signed   By: KUlyses JarredM.D.   On: 07/23/2019 21:52   Mr Lumbar Spine W Wo Contrast  Result Date: 07/23/2019 CLINICAL DATA:  Worsening back pain and multiple falls EXAM: MRI LUMBAR SPINE WITHOUT AND WITH CONTRAST TECHNIQUE: Multiplanar and multiecho pulse sequences of the lumbar spine were obtained without and with intravenous contrast. CONTRAST:  120mGADAVIST GADOBUTROL 1 MMOL/ML IV SOLN COMPARISON:  None. FINDINGS: Segmentation:  Standard Alignment:  Normal Vertebrae:  T12 hemangioma. No fracture or discitis-osteomyelitis. Conus medullaris and cauda equina: Conus extends to the L1 level. Conus and cauda equina appear normal. Paraspinal and other soft tissues: Negative Disc levels: There is no abnormal contrast enhancement of the lumbar spine. T12-L1: Normal disc space and facets. No spinal canal or neuroforaminal stenosis. L1-L2: Normal disc space and facets. No spinal canal or neuroforaminal stenosis. L2-L3: There is a large left subarticular disc extrusion that extends inferiorly into the left L3-4 neural foramen. There is mass effect on the descending left-sided nerve roots. No central spinal canal or neural foraminal stenosis at this level. L3-L4: The disc extrusion extending inferiorly from  the above level contacts the exiting left L3 nerve root. There is also mild disc bulge at this level with mild facet hypertrophy. No central spinal canal or right neural foraminal stenosis. L4-L5: Disc space narrowing with mild diffuse bulge. No spinal canal stenosis. Moderate bilateral neural foraminal stenosis. L5-S1: Small disc bulge. Mild bilateral foraminal stenosis. No spinal canal stenosis. Visualized sacrum: Normal. IMPRESSION: 1. Large left subarticular disc extrusion at L2-L3 that extends inferiorly into the left L3-4 neural foramen and displaces the descending left-sided nerve root. This could contribute to radicular symptoms in the left L3 or L4 distributions. 2. Moderate bilateral L4-L5 neural foraminal stenosis. 3. No spinal canal stenosis. Electronically Signed   By: KeUlyses Jarred.D.   On: 07/23/2019 21:48   Dg Foot Complete Right  Result Date: 07/23/2019 CLINICAL DATA:  Fall. Right foot injury and pain. Initial encounter. EXAM: RIGHT FOOT COMPLETE - 3+ VIEW COMPARISON:  None. FINDINGS: There is no evidence of fracture or dislocation. There is no evidence of arthropathy. Prominent plantar calcaneal bone spur noted. Soft tissue swelling is seen dorsal and lateral aspect of the foot. IMPRESSION: Dorsal and lateral soft tissue swelling. No evidence of fracture or dislocation. Electronically Signed   By: JoMarlaine Hind.D.   On: 07/23/2019 17:46    ____________________________________________    PROCEDURES  Procedure(s) performed:    Procedures    Medications  baclofen (LIORESAL) tablet 10 mg (10 mg Oral Total Dose 07/23/19 2200)  gadobutrol (GADAVIST) 1 MMOL/ML injection 10 mL (10 mLs Intravenous Contrast Given 07/23/19 2112)  predniSONE (DELTASONE) tablet 60 mg (60 mg Oral Given 07/23/19 2305)     ____________________________________________   INITIAL IMPRESSION / ASSESSMENT AND PLAN / ED COURSE  Pertinent labs & imaging results that were available during my care of  the  patient were reviewed by me and considered in my medical decision making (see chart for details).  Review of the [place] CSRS was performed in accordance of the [place] prior to dispensing any controlled drugs.   Patient presented to the emergency department for evaluation of right foot pain after a fall 2 days ago.  Vital signs and exam are reassuring.  Foot x-ray and ankle x-ray are negative for acute bony abnormality.  Patient has had several falls recently due to her knees and her legs "giving out on her."  Patient has seen primary care and has been referred to neurosurgery.  She also sees the pain clinic and is now scheduled for epidural injections.  MRI was ordered to further evaluate symptoms and back pain.  MRI findings were discussed with the patient.  Patient again declines admission to the hospital for multiple falls.  She will use her home walker and will only ambulate with assistance.  She is here with her husband who is able to help her ambulate.  She has physical therapy tomorrow.  Patient will be discharged home with prescriptions for prednisone. Patient is to follow up with neurosurgery as directed. Patient is given ED precautions to return to the ED for any worsening or new symptoms.   Theresa [name] was evaluated in Emergency Department on 07/23/2019 for the symptoms described in the history of present illness. She was evaluated in the context of the global COVID-19 pandemic, which necessitated consideration that the patient might be at risk for infection with the SARS-CoV-2 virus that causes COVID-19. Institutional protocols and algorithms that pertain to the evaluation of patients at risk for COVID-19 are in a state of rapid change based on information released by regulatory bodies including the CDC and federal and state organizations. These policies and algorithms were followed during the patient's care in the ED.  ____________________________________________  FINAL CLINICAL IMPRESSION(S) /  ED DIAGNOSES  Final diagnoses:  Lumbar radiculopathy  Injury of right ankle, initial encounter  Fall, initial encounter      NEW MEDICATIONS STARTED DURING THIS VISIT:  ED Discharge Orders         Ordered    predniSONE (DELTASONE) 10 MG tablet     07/23/19 2247              This chart was dictated using voice recognition software/Dragon. Despite best efforts to proofread, errors can occur which can change the meaning. Any change was purely unintentional.    Laban Emperor, PA-C 07/23/19 2358    Harvest Dark, MD 07/24/19 2250

## 2019-07-23 NOTE — Patient Instructions (Signed)

## 2019-07-24 ENCOUNTER — Ambulatory Visit: Payer: Medicare HMO | Attending: Family | Admitting: Family

## 2019-07-24 ENCOUNTER — Encounter: Payer: Self-pay | Admitting: Family

## 2019-07-24 ENCOUNTER — Telehealth: Payer: Self-pay

## 2019-07-24 VITALS — BP 123/62 | HR 62 | Wt 242.0 lb

## 2019-07-24 DIAGNOSIS — Z720 Tobacco use: Secondary | ICD-10-CM

## 2019-07-24 DIAGNOSIS — I10 Essential (primary) hypertension: Secondary | ICD-10-CM

## 2019-07-24 DIAGNOSIS — I5032 Chronic diastolic (congestive) heart failure: Secondary | ICD-10-CM

## 2019-07-24 DIAGNOSIS — J410 Simple chronic bronchitis: Secondary | ICD-10-CM

## 2019-07-24 DIAGNOSIS — R29898 Other symptoms and signs involving the musculoskeletal system: Secondary | ICD-10-CM

## 2019-07-24 NOTE — Patient Instructions (Signed)
Continue weighing daily and call for an overnight weight gain of > 2 pounds or a weekly weight gain of >5 pounds.

## 2019-07-24 NOTE — Telephone Encounter (Signed)
TELEPHONE CALL NOTE  Theresa [name] has been deemed a candidate for a follow-up tele-health visit to limit community exposure during the Covid-19 pandemic. I spoke with the patient via phone to ensure availability of phone/video source, confirm preferred email & phone number, discuss instructions and expectations, and review consent.   I reminded Theresa [name] to be prepared with any vital sign and/or heart rhythm information that could potentially be obtained via home monitoring, at the time of her visit.  Finally, I reminded Theresa [name] to expect an e-mail containing a link for their video-based visit approximately 15 minutes before her visit, or alternatively, a phone call at the time of her visit if her visit is planned to be a phone encounter.  Did the patient verbally consent to treatment as below? YES  Gaylord Shih, CMA 07/24/2019 10:34 AM  CONSENT FOR TELE-HEALTH VISIT - PLEASE REVIEW  I hereby voluntarily request, consent and authorize [facility] and its employed or contracted physicians, physician assistants, nurse practitioners or other licensed health care professionals (the Practitioner), to provide me with telemedicine health care services (the "Services") as deemed necessary by the treating Practitioner. I acknowledge and consent to receive the Services by the Practitioner via telemedicine. I understand that the telemedicine visit will involve communicating with the Practitioner through telephonic communication technology and the disclosure of certain medical information by electronic transmission. I acknowledge that I have been given the opportunity to request an in-person assessment or other available alternative prior to the telemedicine visit and am voluntarily participating in the telemedicine visit.  I understand that I have the right to withhold or withdraw my consent to the use of telemedicine in the course of my care at any time, without affecting my  right to future care or treatment, and that the Practitioner or I may terminate the telemedicine visit at any time. I understand that I have the right to inspect all information obtained and/or recorded in the course of the telemedicine visit and may receive copies of available information for a reasonable fee.  I understand that some of the potential risks of receiving the Services via telemedicine include:  Marland Kitchen Delay or interruption in medical evaluation due to technological equipment failure or disruption; . Information transmitted may not be sufficient (e.g. poor resolution of images) to allow for appropriate medical decision making by the Practitioner; and/or  . In rare instances, security protocols could fail, causing a breach of personal health information.  Furthermore, I acknowledge that it is my responsibility to provide information about my medical history, conditions and care that is complete and accurate to the best of my ability. I acknowledge that Practitioner's advice, recommendations, and/or decision may be based on factors not within their control, such as incomplete or inaccurate data provided by me or lack of visual representation. I understand that the practice of medicine is not an exact science and that Practitioner makes no warranties or guarantees regarding treatment outcomes. I acknowledge that I will receive a copy of this consent concurrently upon execution via email to the email address I last provided but may also request a printed copy by calling the office of [facility].    I understand that my insurance may be billed for this visit.   I have read or had this consent read to me. . I understand the contents of this consent, which adequately explains the benefits and risks of the Services being provided via telemedicine.  . I have been  provided ample opportunity to ask questions regarding this consent and the Services and have had my questions answered to my  satisfaction. . I give my informed consent for the services to be provided through the use of telemedicine in my medical care  By participating in this telemedicine visit I agree to the above.

## 2019-07-24 NOTE — Progress Notes (Signed)
Virtual Visit via Telephone Note   Evaluation Performed:  Follow-up visit  This visit type was conducted due to national recommendations for restrictions regarding the COVID-19 Pandemic (e.g. social distancing).  This format is felt to be most appropriate for this patient at this time.  All issues noted in this document were discussed and addressed.  No physical exam was performed (except for noted visual exam findings with Video Visits).  Please refer to the patient's chart (MyChart message for video visits and phone note for telephone visits) for the patient's consent to telehealth for [facility]  Date:  07/24/2019   ID:  [name], DOB 1956-08-27, MRN [id number]  Patient Location:  [address] [place] [address]   Provider location:   [facility] [place] [address], [place] [address]  PCP:  Margo Common, [place]  Cardiologist:  Lujean Amel, MD Electrophysiologist:  None   Chief Complaint: shortness of breath  History of Present Illness:    Mahdiya Mossberg is a 63 y.o. female who presents via audio/video conferencing for a telehealth visit today.  Patient verified DOB and address.  The patient does not have symptoms concerning for COVID-19 infection (fever, chills, cough, or new SHORTNESS OF BREATH).   Patient reports minimal shortness of breath upon moderate exertion. She says that this has been present for several years although she says that her breathing has improved from her most recent hospital admission. She has associated leg weakness, back/leg pain and fatigue along with this. She denies any dizziness, swelling in her legs, chest pain, difficulty sleeping or weight gain. Has had numerous falls related to her legs "buckling" where she falls down. Is supposed to see neurologist later this week.   Has received her flu vaccine for this year.   Prior CV studies:   The following studies were reviewed  today:  Catheterization done 07/11/2019 & showed:   Prox RCA to Dist RCA lesion is 50% stenosed.  Mid LAD lesion is 50% stenosed.  Ost LM to Dist LM lesion is 10% stenosed.  Hemodynamic findings consistent with moderate pulmonary hypertension.  Normal overall left ventricular function of at least 60%  Moderate coronary disease no significant obstructive disease  Moderate pulmonary hypertension   Conclusion Inpatient diagnostic right and left heart cath Indication shortness of breath possible heart failure Left ventriculogram normal overall left ventricular function ejection fraction of 60% Coronaries No significant obstructive coronary disease Left main large relatively free of disease moderate calcification LAD mild to moderate coronary artery disease with moderate calcification no obstructive disease Circumflex moderate size no significant obstructive disease RCA was large with moderate disease diffuse moderate calcification no obstructive disease Right heart cath Moderate pulmonary hypertension PA pressure mean of 39 Wedge pressure mean of 13 Fick cardiac output of 14 index of 7  Past Medical History:  Diagnosis Date  . Acute postoperative pain 10/03/2017  . Anxiety   . BiPAP (biphasic positive airway pressure) dependence    at hs  . Carpal tunnel syndrome   . CHF (congestive heart failure) ([place])    1/18  . CHF (congestive heart failure) ([place])   . Chronic generalized abdominal pain   . Chronic rhinitis   . COPD (chronic obstructive pulmonary disease) ([place])   . DDD (degenerative disc disease), cervical   . DDD (degenerative disc disease), lumbosacral   . Depression   . Dyspnea   . Edema   . Flu    1/18  .  Gastritis   . GERD (gastroesophageal reflux disease)   . Hematuria   . Hernia of abdominal wall 07/17/2018  . Hernia, abdominal   . Hypertension   . Kidney stones   . Low back pain   . Lumbar radiculitis   . Malodorous urine   . Muscle weakness   .  Obesity   . Oxygen dependent   . Renal cyst   . Sensory urge incontinence   . Thyroid activity decreased    9/19  . Tobacco abuse   . Wheezing    Past Surgical History:  Procedure Laterality Date  . ABDOMINAL HYSTERECTOMY    . CARPAL TUNNEL RELEASE Left 2012  . EPIGASTRIC HERNIA REPAIR N/A 08/13/2018   HERNIA REPAIR EPIGASTRIC ADULT; polypropylene mesh reinforcement.  Surgeon: Robert Bellow, MD;  Location: [facility];  Service: General;  Laterality: N/A;  . RIGHT/LEFT HEART CATH AND CORONARY ANGIOGRAPHY N/A 07/11/2019   Procedure: RIGHT/LEFT HEART CATH AND CORONARY ANGIOGRAPHY;  Surgeon: Yolonda Kida, MD;  Location: [place];  Service: Cardiovascular;  Laterality: N/A;  . TONSILLECTOMY    . TUBAL LIGATION    . UMBILICAL HERNIA REPAIR N/A 08/13/2018   HERNIA REPAIR UMBILICAL ADULT; polypropylene mesh reinforcement Surgeon: Robert Bellow, MD;  Location: [facility];  Service: General;  Laterality: N/A;     Allergies:   Codeine and Meloxicam   Prior to Admission medications   Medication Sig Start Date End Date Taking? Authorizing Provider  ALPRAZolam Duanne Moron) 0.5 MG tablet Take 1 tablet (0.5 mg total) by mouth 2 (two) times daily as needed for anxiety. Patient taking differently: Take 0.5 mg by mouth daily as needed for anxiety.  12/09/16  Yes Darylene Price A, FNP  aspirin EC 81 MG tablet Take 81 mg by mouth daily.   Yes [provider]  baclofen (LIORESAL) 10 MG tablet Take 1 tablet (10 mg total) by mouth 3 (three) times daily as needed for muscle spasms. 07/18/19 07/17/20 Yes Chrismon, Vickki Muff, PA  bisoprolol-hydrochlorothiazide (ZIAC) 10-6.25 MG tablet TAKE 1 TABLET BY MOUTH DAILY 06/27/19  Yes Chrismon, Vickki Muff, PA  Cholecalciferol ([employer] D3) 2000 units CAPS Take 2,000 Units by mouth daily.    Yes [provider]  digoxin (LANOXIN) 0.25 MG tablet Take 1 tablet (0.25 mg total) by mouth daily. 07/13/19  Yes Bettey Costa, MD   Fluticasone-Umeclidin-Vilant 100-62.5-25 MCG/INH AEPB Inhale 1 puff into the lungs daily. 10/03/18  Yes [provider]  gabapentin (NEURONTIN) 300 MG capsule Take 1 capsule (300 mg total) by mouth 4 (four) times daily. 05/18/19 08/16/19 Yes Milinda Pointer, MD  ipratropium-albuterol (DUONEB) 0.5-2.5 (3) MG/3ML SOLN USE 1 VIAL VIA NEBULIZER EVERY 6 HOURS AS NEEDED FOR SHORTNESS OF BREATH OR WHEEZING Patient taking differently: Take 3 mLs by nebulization every 4 (four) hours as needed (shortness of breat / wheezing).  11/30/18  Yes Chrismon, Vickki Muff, PA  levothyroxine (SYNTHROID) 25 MCG tablet Take 1 tablet (25 mcg total) by mouth daily before breakfast. DUPLICATE ENTRY 9/44/96  Yes Chrismon, Simona Huh E, PA  lidocaine (LIDODERM) 5 % Place 1 patch onto the skin daily. Remove & Discard patch within 12 hours or as directed by MD 07/13/19  Yes Bettey Costa, MD  potassium chloride SA (K-DUR,KLOR-CON) 20 MEQ tablet Take 1 tablet (20 mEq total) by mouth 2 (two) times daily. 08/31/18  Yes Dimitri Dsouza, Otila Kluver A, FNP  predniSONE (DELTASONE) 10 MG tablet Take 6 tablets on day 1, take 5 tablets on day 2, take  4 tablets on day 3, take 3 tablets on day 4, take 2 tablets on day 5, take 1 tablet on day 6 07/23/19  Yes Laban Emperor, PA-C  sertraline (ZOLOFT) 100 MG tablet TAKE 2 TABLETS(200 MG) BY MOUTH EVERY MORNING Patient taking differently: Take 200 mg by mouth daily.  11/16/18  Yes Chrismon, Vickki Muff, PA  torsemide (DEMADEX) 20 MG tablet Take 2 tablets (40 mg total) by mouth 2 (two) times daily. 07/13/19  Yes Bettey Costa, MD  VENTOLIN HFA 108 (90 Base) MCG/ACT inhaler Inhale 2 puffs into the lungs every 6 (six) hours as needed for shortness of breath. 02/10/18  Yes Fritzi Mandes, MD  HYDROcodone-acetaminophen (NORCO/VICODIN) 5-325 MG tablet Take 1 tablet by mouth every 8 (eight) hours as needed for severe pain. Must last 30 days Patient taking differently: Take 1 tablet by mouth every 6 (six) hours as needed for  severe pain. Must last 30 days 06/17/19 07/17/19  Milinda Pointer, MD  ibuprofen (ADVIL,MOTRIN) 200 MG tablet Take 1,200 mg by mouth every 6 (six) hours as needed for headache or moderate pain.    [provider]     Social History   Tobacco Use  . Smoking status: Current Every Day Smoker    Packs/day: 1.00    Years: 44.00    Pack years: 44.00    Types: Cigarettes  . Smokeless tobacco: Never Used  Substance Use Topics  . Alcohol use: No    Frequency: Never  . Drug use: No     Family Hx: The patient's family history includes Alcohol abuse in her father; Breast cancer in her sister; Cancer in her brother, brother, and sister; Coronary artery disease in her mother; Heart disease in her father and mother; Lung cancer in her sister; Pneumonia in her brother; Stroke in her mother. There is no history of Prostate cancer, Kidney cancer, or Bladder Cancer.  ROS:   Please see the history of present illness.     All other systems reviewed and are negative.   Labs/Other Tests and Data Reviewed:    Recent Labs: 11/16/2018: ALT 20; TSH 0.805 07/06/2019: Magnesium 2.1 07/09/2019: B Natriuretic Peptide 108.0 07/14/2019: BUN 31; Creatinine, Ser 0.75; Hemoglobin 15.6; Platelets 157; Potassium 4.5; Sodium 137   Recent Lipid Panel No results found for: CHOL, TRIG, HDL, CHOLHDL, LDLCALC, LDLDIRECT  Wt Readings from Last 3 Encounters:  07/24/19 242 lb (109.8 kg)  07/23/19 245 lb (111.1 kg)  07/16/19 242 lb (109.8 kg)     Exam:    Vitals:   07/24/19 1344  BP: 123/62  Pulse: 62  SpO2: 97%  Weight: 242 lb (109.8 kg)   Wt Readings from Last 3 Encounters:  07/24/19 242 lb (109.8 kg)  07/23/19 245 lb (111.1 kg)  07/16/19 242 lb (109.8 kg)     Well nourished, well developed female in no  acute distress.   ASSESSMENT & PLAN:    1. Chronic heart failure with preserved ejection fraction- - NYHA class II - euvolemic per patient's description - continues to weigh daily at  home. Reminded to call for an overnight weight gain of >2 pounds or a weekly weight gain of >5 pounds - monitor sodium content carefully - saw cardiologist Clayborn Bigness) 04/24/2019  - BNP 07/09/2019 was 108.0  2: HTN- - self-reported BP looks good today (123/62) - saw PCP (Chrismon) 03/18/2019 - BMP from 07/14/2019 reviewed and showed sodium 137, potassium 4.5, creatinine 0.75 and GFR >60  3: COPD- - wearing bipap nightly -  oxygen at 2.5L at bedtime and then as needed during the day - last saw pulmonology Raul Del) on 02/05/2019 - has recently resumed prednisone  4: Tobacco use- - alternates between the lozenges, gum and actual cigarettes - removes herself from the oxygen when smoking - complete cessation discussed for 3 minutes with her  5: Leg weakness- - has had numerous falls due to "legs buckling" resulting in falls - supposed to see neurosurgery later this week - continues with pain management  Patient reviewed medication list over the phone.   Return in 3 months or sooner for any questions/problems before then.   COVID-19 Education: The signs and symptoms of COVID-19 were discussed with the patient and how to seek care for testing (follow up with PCP or arrange E-visit).  The importance of social distancing was discussed today.  Patient Risk:   After full review of this patients clinical status, I feel that they are at least moderate risk at this time.  Time:   Today, I have spent 13 minutes with the patient with telehealth technology discussing medications, weights and symptoms to report.     Medication Adjustments/Labs and Tests Ordered: Current medicines are reviewed at length with the patient today.  Concerns regarding medicines are outlined above.   Tests Ordered: No orders of the defined types were placed in this encounter.  Medication Changes: No orders of the defined types were placed in this encounter.   Disposition: Follow-up in 3 months or sooner for any  questions/problems before then.   Signed, [name], FNP  07/24/2019 1:44 PM    [facility]

## 2019-07-25 DIAGNOSIS — M5416 Radiculopathy, lumbar region: Secondary | ICD-10-CM | POA: Diagnosis not present

## 2019-07-25 DIAGNOSIS — J449 Chronic obstructive pulmonary disease, unspecified: Secondary | ICD-10-CM | POA: Diagnosis not present

## 2019-07-25 DIAGNOSIS — R609 Edema, unspecified: Secondary | ICD-10-CM | POA: Diagnosis not present

## 2019-07-25 DIAGNOSIS — M6281 Muscle weakness (generalized): Secondary | ICD-10-CM | POA: Diagnosis not present

## 2019-07-25 DIAGNOSIS — R600 Localized edema: Secondary | ICD-10-CM | POA: Diagnosis not present

## 2019-07-25 DIAGNOSIS — J439 Emphysema, unspecified: Secondary | ICD-10-CM | POA: Diagnosis not present

## 2019-07-25 DIAGNOSIS — F411 Generalized anxiety disorder: Secondary | ICD-10-CM | POA: Diagnosis not present

## 2019-07-25 DIAGNOSIS — Z6841 Body Mass Index (BMI) 40.0 and over, adult: Secondary | ICD-10-CM | POA: Diagnosis not present

## 2019-07-25 DIAGNOSIS — I10 Essential (primary) hypertension: Secondary | ICD-10-CM | POA: Diagnosis not present

## 2019-07-25 DIAGNOSIS — I2721 Secondary pulmonary arterial hypertension: Secondary | ICD-10-CM | POA: Diagnosis not present

## 2019-07-25 DIAGNOSIS — F329 Major depressive disorder, single episode, unspecified: Secondary | ICD-10-CM | POA: Diagnosis not present

## 2019-07-26 ENCOUNTER — Ambulatory Visit (INDEPENDENT_AMBULATORY_CARE_PROVIDER_SITE_OTHER): Payer: Medicare HMO | Admitting: Family Medicine

## 2019-07-26 ENCOUNTER — Encounter: Payer: Self-pay | Admitting: Family Medicine

## 2019-07-26 DIAGNOSIS — I5032 Chronic diastolic (congestive) heart failure: Secondary | ICD-10-CM | POA: Diagnosis not present

## 2019-07-26 DIAGNOSIS — S93401D Sprain of unspecified ligament of right ankle, subsequent encounter: Secondary | ICD-10-CM

## 2019-07-26 DIAGNOSIS — R0902 Hypoxemia: Secondary | ICD-10-CM | POA: Diagnosis not present

## 2019-07-26 DIAGNOSIS — J449 Chronic obstructive pulmonary disease, unspecified: Secondary | ICD-10-CM

## 2019-07-26 DIAGNOSIS — M5126 Other intervertebral disc displacement, lumbar region: Secondary | ICD-10-CM

## 2019-07-26 DIAGNOSIS — R52 Pain, unspecified: Secondary | ICD-10-CM | POA: Diagnosis not present

## 2019-07-26 NOTE — Progress Notes (Signed)
Patient: Theresa [name] Female    DOB: 08/09/1956   63 y.o.   MRN: [id number] Visit Date: 07/26/2019  Today's Provider: Simona Huh Chrismon, PA   Follow up right ankle injury.  Subjective:     HPI : This 63 year old female went to the ER on 07-24-19 after falling at home 2 days prior. States she was using a walker when she fell backwards as the right knee buckled. She was unfamiliar with this type of walker and has gone back to her own personal walker with better ambulation and balance. She has a history of lumbar disc disease and protrusion of disc at L4-5 with chronic back pain and radicular pain into the left leg. She is followed by pain management and scheduled for neurosurgical evaluation on 08-08-19 with Dr. Cari Caraway. She is on oxygen 2-2.5 LPM and BiPAP at night for COPD with hypoxia and chronic diastolic heart failure. Contacted today for transition of care follow up.   Virtual Visit via Telephone Note  I connected with Theresa [name] on 07/26/19 at  8:40 AM EDT by telephone and verified that I am speaking with the correct person using two identifiers.   Patient location: home Provider location: [place] office  Persons involved in the visit: patient, provider Simona Huh E.Chrismon, PA-C)    I discussed the limitations, risks, security and privacy concerns of performing an evaluation and management service by telephone and the availability of in person appointments. I also discussed with the patient that there may be a patient responsible charge related to this service. The patient expressed understanding and agreed to proceed.  Past Medical History:  Diagnosis Date  . Acute postoperative pain 10/03/2017  . Anxiety   . BiPAP (biphasic positive airway pressure) dependence    at hs  . Carpal tunnel syndrome   . CHF (congestive heart failure) ([place])    1/18  . CHF (congestive heart failure) ([place])   . Chronic generalized abdominal pain   . Chronic  rhinitis   . COPD (chronic obstructive pulmonary disease) ([place])   . DDD (degenerative disc disease), cervical   . DDD (degenerative disc disease), lumbosacral   . Depression   . Dyspnea   . Edema   . Flu    1/18  . Gastritis   . GERD (gastroesophageal reflux disease)   . Hematuria   . Hernia of abdominal wall 07/17/2018  . Hernia, abdominal   . Hypertension   . Kidney stones   . Low back pain   . Lumbar radiculitis   . Malodorous urine   . Muscle weakness   . Obesity   . Oxygen dependent   . Renal cyst   . Sensory urge incontinence   . Thyroid activity decreased    9/19  . Tobacco abuse   . Wheezing    Patient Active Problem List   Diagnosis Date Noted  . Acute on chronic respiratory failure with hypoxia ([place]) 07/06/2019  . Cervicalgia (Bilateral) (R>L) 09/24/2018  . Spondylosis without myelopathy or radiculopathy, lumbosacral region 09/24/2018  . Epigastric hernia 05/04/2018  . Chronic lower extremity pain (Bilateral) (L>R) 01/25/2018  . Altered mental status, unspecified 12/09/2017  . Chronic hip pain [facility]) (Bilateral) (L>R) 11/27/2017  . COPD (chronic obstructive pulmonary disease) ([place]) 08/11/2017  . Other long term (current) drug therapy 08/08/2017  . Disorder of bone, unspecified 08/08/2017  . COPD with hypoxia ([place]) 06/27/2017  . Arthralgia of acromioclavicular joint (Right) 06/15/2017  .  Acromioclavicular joint DJD (Right) 06/15/2017  . 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  . Chronic shoulder pain (Left) 05/11/2017  . Elevated brain natriuretic peptide (BNP) level 05/11/2017  . Grief at loss of child 02/14/2017  . Lumbar facet hypertrophy (multilevel) 11/17/2016  . Lumbar spinal stenosis (L4-5) 11/17/2016  . 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) 11/17/2016  . 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 Lumbosacral radiculopathy at L4 11/17/2016  . Supplemental oxygen dependent 08/01/2016  .  Sleep apnea 06/02/2016  . Long term current use of opiate analgesic 01/12/2016  . Long term prescription opiate use 01/12/2016  . Opiate use (15 MME/Day) 01/12/2016  . Lumbar facet syndrome (Bilateral) (L>R) 01/12/2016  . Chronic sacroiliac joint pain (Bilateral) 01/12/2016  . Vitamin D deficiency 12/30/2015  . 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 Obesity, Class III, BMI 40-49.9 (morbid obesity) ([place]) 12/28/2015  . 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[place]) 11/19/2015  . Coagulation disorder ([place]) 10/22/2015  . Chronic pain syndrome (significant psychosocial component) 09/21/2015  . 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 09/21/2015  . Encounter for therapeutic drug level monitoring 09/16/2015  . Encounter for chronic pain management 09/16/2015  . Pain management 09/16/2015  . Neurogenic pain 09/16/2015  . Neuropathic pain 09/16/2015  . Musculoskeletal pain 09/16/2015  . Lumbar spondylosis (L4-5) 09/16/2015  . Chronic low back pain (Primary Area of Pain) (Bilateral) (L>R) 09/16/2015  . Chronic lower extremity pain (Secondary Area of Pain) (Left) 09/16/2015  . Chronic lumbar radicular pain (L4 & S1 Dermatome) (Left) 09/16/2015  . Chronic shoulder pain (Right side) 09/16/2015  . Chronic upper extremity pain (Right-sided) 09/16/2015  . Cervical radiculitis (Right side) 09/16/2015  . Abnormal x-ray of lumbar spine 09/16/2015  . Chronic diastolic heart failure ([place]) 07/15/2015  . Chest pain 07/15/2015  . Tobacco abuse 07/15/2015  . Blood clotting disorder ([place]) 04/21/2015  . Decreased motor strength 07/16/2014  . Clinical depression 07/16/2014  . Chronic rhinitis 07/16/2014  . Carpal tunnel syndrome 07/16/2014  . Anxiety state 07/16/2014  . Neuritis or radiculitis due to rupture of lumbar intervertebral disc 07/02/2014  . DDD (degenerative disc disease), lumbar 07/02/2014  . CAFL (chronic airflow limitation) ([place]) 02/27/2014  . Essential  (primary) hypertension 03/27/2008   Past Surgical History:  Procedure Laterality Date  . ABDOMINAL HYSTERECTOMY    . CARPAL TUNNEL RELEASE Left 2012  . EPIGASTRIC HERNIA REPAIR N/A 08/13/2018   HERNIA REPAIR EPIGASTRIC ADULT; polypropylene mesh reinforcement.  Surgeon: Robert Bellow, MD;  Location: [facility] ORS;  Service: General;  Laterality: N/A;  . RIGHT/LEFT HEART CATH AND CORONARY ANGIOGRAPHY N/A 07/11/2019   Procedure: RIGHT/LEFT HEART CATH AND CORONARY ANGIOGRAPHY;  Surgeon: Yolonda Kida, MD;  Location: [place] [facility];  Service: Cardiovascular;  Laterality: N/A;  . TONSILLECTOMY    . TUBAL LIGATION    . UMBILICAL HERNIA REPAIR N/A 08/13/2018   HERNIA REPAIR UMBILICAL ADULT; polypropylene mesh reinforcement Surgeon: Robert Bellow, MD;  Location: [facility] OR[facility];  Service: General;  Laterality: N/A;   Family History  Problem Relation Age of Onset  . Heart disease Mother   . Stroke Mother   . Coronary artery disease Mother   . Lung cancer Sister   . Cancer Sister   . Breast cancer Sister   . Alcohol abuse Father   . Heart disease Father   . Cancer Brother   . Cancer Brother   . Pneumonia Brother   . Prostate cancer Neg Hx   .  Kidney cancer Neg Hx   . Bladder Cancer Neg Hx    Allergies  Allergen Reactions  . Codeine Nausea And Vomiting  . Meloxicam Other (See Comments)    Stomach pain    Current Outpatient Medications:  .  ALPRAZolam (XANAX) 0.5 MG tablet, Take 1 tablet (0.5 mg total) by mouth 2 (two) times daily as needed for anxiety. (Patient taking differently: Take 0.5 mg by mouth daily as needed for anxiety. ), Disp: 60 tablet, Rfl: 0 .  aspirin EC 81 MG tablet, Take 81 mg by mouth daily., Disp: , Rfl:  .  baclofen (LIORESAL) 10 MG tablet, Take 1 tablet (10 mg total) by mouth 3 (three) times daily as needed for muscle spasms., Disp: 30 tablet, Rfl: 0 .  bisoprolol-hydrochlorothiazide (ZIAC) 10-6.25 MG tablet, TAKE 1 TABLET BY MOUTH DAILY, Disp: 90 tablet,  Rfl: 3 .  Cholecalciferol (CV[employer] D3) 2000 units CAPS, Take 2,000 Units by mouth daily. , Disp: , Rfl:  .  digoxin (LANOXIN) 0.25 MG tablet, Take 1 tablet (0.25 mg total) by mouth daily., Disp: 30 tablet, Rfl: 0 .  Fluticasone-Umeclidin-Vilant 100-62.5-25 MCG/INH AEPB, Inhale 1 puff into the lungs daily., Disp: , Rfl:  .  gabapentin (NEURONTIN) 300 MG capsule, Take 1 capsule (300 mg total) by mouth 4 (four) times daily., Disp: 120 capsule, Rfl: 2 .  HYDROcodone-acetaminophen (NORCO/VICODIN) 5-325 MG tablet, Take 1 tablet by mouth every 8 (eight) hours as needed for severe pain. Must last 30 days (Patient taking differently: Take 1 tablet by mouth every 6 (six) hours as needed for severe pain. Must last 30 days), Disp: 90 tablet, Rfl: 0 .  ibuprofen (ADVIL,MOTRIN) 200 MG tablet, Take 1,200 mg by mouth every 6 (six) hours as needed for headache or moderate pain., Disp: , Rfl:  .  ipratropium-albuterol (DUONEB) 0.5-2.5 (3) MG/3ML SOLN, USE 1 VIAL VIA NEBULIZER EVERY 6 HOURS AS NEEDED FOR SHORTNESS OF BREATH OR WHEEZING (Patient taking differently: Take 3 mLs by nebulization every 4 (four) hours as needed (shortness of breat / wheezing). ), Disp: 360 mL, Rfl: 1 .  levothyroxine (SYNTHROID) 25 MCG tablet, Take 1 tablet (25 mcg total) by mouth daily before breakfast. DUPLICATE ENTRY, Disp: 30 tablet, Rfl: 0 .  lidocaine (LIDODERM) 5 %, Place 1 patch onto the skin daily. Remove & Discard patch within 12 hours or as directed by MD, Disp: 30 patch, Rfl: 0 .  potassium chloride SA (K-DUR,KLOR-CON) 20 MEQ tablet, Take 1 tablet (20 mEq total) by mouth 2 (two) times daily., Disp: 180 tablet, Rfl: 3 .  predniSONE (DELTASONE) 10 MG tablet, Take 6 tablets on day 1, take 5 tablets on day 2, take 4 tablets on day 3, take 3 tablets on day 4, take 2 tablets on day 5, take 1 tablet on day 6, Disp: 21 tablet, Rfl: 0 .  sertraline (ZOLOFT) 100 MG tablet, TAKE 2 TABLETS(200 MG) BY MOUTH EVERY MORNING (Patient taking differently:  Take 200 mg by mouth daily. ), Disp: 180 tablet, Rfl: 3 .  torsemide (DEMADEX) 20 MG tablet, Take 2 tablets (40 mg total) by mouth 2 (two) times daily., Disp: , Rfl:  .  VENTOLIN HFA 108 (90 Base) MCG/ACT inhaler, Inhale 2 puffs into the lungs every 6 (six) hours as needed for shortness of breath., Disp: 1 Inhaler, Rfl: 5  Review of Systems  Constitutional: Positive for fatigue. Negative for fever.  HENT: Negative.   Respiratory: Negative for shortness of breath and wheezing.   Cardiovascular: Negative.  Gastrointestinal: Negative.   Musculoskeletal: Positive for back pain.  Neurological: Positive for weakness.   Social History   Tobacco Use  . Smoking status: Current Every Day Smoker    Packs/day: 1.00    Years: 44.00    Pack years: 44.00    Types: Cigarettes  . Smokeless tobacco: Never Used  Substance Use Topics  . Alcohol use: No    Frequency: Never      Objective:     Wt Readings from Last 3 Encounters:  07/24/19 242 lb (109.8 kg)  07/23/19 245 lb (111.1 kg)  07/16/19 242 lb (109.8 kg)   Temp Readings from Last 3 Encounters:  07/23/19 98.6 F (37 C) (Oral)  07/18/19 (!) 97.1 F (36.2 C) (Skin)  07/16/19 98.7 F (37.1 C) (Oral)   BP Readings from Last 3 Encounters:  07/24/19 123/62  07/23/19 (!) 104/52  07/18/19 (!) 100/52   Pulse Readings from Last 3 Encounters:  07/24/19 62  07/23/19 65  07/18/19 81   Usual shortness of breath during telephonic interview.    Assessment & Plan    1. 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) Continues to have chronic pain despite use of Norco, Baclofen and Gabapentin. "Buckling" and weakness in the left leg causing falls. Also, contributed to the recent sprain of the right ankle. Scheduled for neurosurgical evaluation with Dr. Cari Caraway.  2. Sprain of right ankle, unspecified ligament, subsequent encounter X-ray evaluation at the ER on 07-23-19 was negative for fracture. Using a boot cast for  support and trying to limit walking. Schedule for PT evaluation and treatment with chronic back pain and this ankle injury. Recheck as needed.  3. Chronic diastolic heart failure (HCC) EF 60-65% on 07-07-19. Followed at the CHF clinic by Darylene Price, FNP  10-7-20and Dr. Clayborn Bigness (cardiologist) 07-25-19. Advised to monitor weight and peripheral edema. Catheterization done 07/11/2019 & showed:   Prox RCA to Dist RCA lesion is 50% stenosed.  Mid LAD lesion is 50% stenosed.  Ost LM to Dist LM lesion is 10% stenosed.  Hemodynamic findings consistent with moderate pulmonary hypertension.  Normal overall left ventricular function of at least 60%  Moderate coronary disease no significant obstructive disease          Moderate pulmonary hypertension  4. Pain management Followed by Dr. Dossie Arbour for chronic back pain with lumbar radiculopathy along the left L4 & S1 dermatome, lumbar disc herniation with foraminal protrusion left L4-5, spinal stenosis of lumbar region with neurogenic claudication and left L4-5 stenosis. MRI with and without contrast reported large left subarticular disc extrusion at L2-L3 that extends inferiorly into the left L3-4 neural foramen and displaces the descending left-sided nerve root which could contribute to radicular symptoms in the left L3 or L4 distributions. Also, a moderate bilateral L4-L5 neural foraminal stenosis. She has been scheduled for PT and a neurosurgical referral with Dr. Cari Caraway on 08-08-19. Presently on Baclofen 10 mg TID prn, Gabapentin 300 mg QID, Norco 5-325 mg 1 tablet TID prn, Lidoderm patch qd 24 hours and Ibuprofen. Pain persists and having weakness/"buckling" in her knees frequently.  5. COPD with hypoxia (HCC) Feeling stable today and still using Duoneb by nebulizer QID prn, Trelegy 1 puff qd and Ventolin-HFA 2 puffs QID prn. Still using continuous oxygen by nasal cannula at 2-2.5 LPM. Can't walk without severe drop in pulse oximetry when off  oxygen. Usually ambulates by wheel chair outside of the home. Followed by Dr. Raul Del (pulmonologist) today.  I discussed the assessment  and treatment plan with the patient. The patient was provided an opportunity to ask questions and all were answered. The patient agreed with the plan and demonstrated an understanding of the instructions.   The patient was advised to call back or seek an in-person evaluation if the symptoms worsen or if the condition fails to improve as anticipated.  Total time spent on preparation and actual interview: 18 minutes    Theresa [name], [place]

## 2019-07-29 DIAGNOSIS — M5030 Other cervical disc degeneration, unspecified cervical region: Secondary | ICD-10-CM | POA: Diagnosis not present

## 2019-07-29 DIAGNOSIS — R0600 Dyspnea, unspecified: Secondary | ICD-10-CM | POA: Diagnosis not present

## 2019-07-29 DIAGNOSIS — J441 Chronic obstructive pulmonary disease with (acute) exacerbation: Secondary | ICD-10-CM | POA: Diagnosis not present

## 2019-07-29 DIAGNOSIS — J439 Emphysema, unspecified: Secondary | ICD-10-CM | POA: Diagnosis not present

## 2019-07-29 DIAGNOSIS — Z9981 Dependence on supplemental oxygen: Secondary | ICD-10-CM | POA: Diagnosis not present

## 2019-07-29 DIAGNOSIS — E039 Hypothyroidism, unspecified: Secondary | ICD-10-CM | POA: Diagnosis not present

## 2019-07-29 DIAGNOSIS — F419 Anxiety disorder, unspecified: Secondary | ICD-10-CM | POA: Diagnosis not present

## 2019-07-29 DIAGNOSIS — I110 Hypertensive heart disease with heart failure: Secondary | ICD-10-CM | POA: Diagnosis not present

## 2019-07-29 DIAGNOSIS — M5137 Other intervertebral disc degeneration, lumbosacral region: Secondary | ICD-10-CM | POA: Diagnosis not present

## 2019-07-29 DIAGNOSIS — J9611 Chronic respiratory failure with hypoxia: Secondary | ICD-10-CM | POA: Diagnosis not present

## 2019-07-29 DIAGNOSIS — G8929 Other chronic pain: Secondary | ICD-10-CM | POA: Diagnosis not present

## 2019-07-29 DIAGNOSIS — I5032 Chronic diastolic (congestive) heart failure: Secondary | ICD-10-CM | POA: Diagnosis not present

## 2019-07-29 DIAGNOSIS — F17218 Nicotine dependence, cigarettes, with other nicotine-induced disorders: Secondary | ICD-10-CM | POA: Diagnosis not present

## 2019-07-30 ENCOUNTER — Other Ambulatory Visit: Payer: Self-pay

## 2019-07-30 ENCOUNTER — Encounter: Payer: Self-pay | Admitting: Pain Medicine

## 2019-07-30 ENCOUNTER — Ambulatory Visit
Admission: RE | Admit: 2019-07-30 | Discharge: 2019-07-30 | Disposition: A | Discharge status: Home / Self Care | Payer: Medicare HMO | Source: Ambulatory Visit | Attending: Pain Medicine | Admitting: Pain Medicine

## 2019-07-30 ENCOUNTER — Ambulatory Visit (HOSPITAL_BASED_OUTPATIENT_CLINIC_OR_DEPARTMENT_OTHER): Payer: Medicare HMO | Admitting: Pain Medicine

## 2019-07-30 VITALS — BP 106/63 | HR 65 | Temp 97.8°F | Resp 21 | Ht 62.0 in | Wt 242.0 lb

## 2019-07-30 DIAGNOSIS — M5416 Radiculopathy, lumbar region: Secondary | ICD-10-CM

## 2019-07-30 DIAGNOSIS — G8929 Other chronic pain: Secondary | ICD-10-CM | POA: Diagnosis not present

## 2019-07-30 DIAGNOSIS — M48061 Spinal stenosis, lumbar region without neurogenic claudication: Secondary | ICD-10-CM

## 2019-07-30 DIAGNOSIS — M48062 Spinal stenosis, lumbar region with neurogenic claudication: Secondary | ICD-10-CM | POA: Insufficient documentation

## 2019-07-30 DIAGNOSIS — M79605 Pain in left leg: Secondary | ICD-10-CM | POA: Diagnosis not present

## 2019-07-30 DIAGNOSIS — M5417 Radiculopathy, lumbosacral region: Secondary | ICD-10-CM | POA: Diagnosis not present

## 2019-07-30 DIAGNOSIS — M545 Low back pain, unspecified: Secondary | ICD-10-CM

## 2019-07-30 DIAGNOSIS — M5126 Other intervertebral disc displacement, lumbar region: Secondary | ICD-10-CM | POA: Diagnosis not present

## 2019-07-30 DIAGNOSIS — M47816 Spondylosis without myelopathy or radiculopathy, lumbar region: Secondary | ICD-10-CM

## 2019-07-30 DIAGNOSIS — M79604 Pain in right leg: Secondary | ICD-10-CM | POA: Insufficient documentation

## 2019-07-30 DIAGNOSIS — R0902 Hypoxemia: Secondary | ICD-10-CM | POA: Diagnosis present

## 2019-07-30 DIAGNOSIS — J449 Chronic obstructive pulmonary disease, unspecified: Secondary | ICD-10-CM | POA: Diagnosis present

## 2019-07-30 DIAGNOSIS — M5136 Other intervertebral disc degeneration, lumbar region: Secondary | ICD-10-CM | POA: Diagnosis not present

## 2019-07-30 MED ORDER — SODIUM CHLORIDE 0.9% FLUSH
2.0000 mL | Freq: Once | INTRAVENOUS | Status: AC
  Administered 2019-07-30: 2 mL

## 2019-07-30 MED ORDER — IOHEXOL 180 MG/ML  SOLN
10.0000 mL | Freq: Once | INTRAMUSCULAR | Status: AC
  Administered 2019-07-30: 10 mL via EPIDURAL

## 2019-07-30 MED ORDER — ROPIVACAINE HCL 2 MG/ML IJ SOLN
2.0000 mL | Freq: Once | INTRAMUSCULAR | Status: AC
  Administered 2019-07-30: 2 mL via EPIDURAL
  Filled 2019-07-30: qty 10

## 2019-07-30 MED ORDER — LIDOCAINE HCL 2 % IJ SOLN
20.0000 mL | Freq: Once | INTRAMUSCULAR | Status: AC
  Administered 2019-07-30: 400 mg
  Filled 2019-07-30: qty 40

## 2019-07-30 MED ORDER — TRIAMCINOLONE ACETONIDE 40 MG/ML IJ SUSP
40.0000 mg | Freq: Once | INTRAMUSCULAR | Status: AC
  Administered 2019-07-30: 40 mg
  Filled 2019-07-30: qty 1

## 2019-07-30 NOTE — Patient Instructions (Addendum)
____________________________________________________________________________________________  Post-Procedure Discharge Instructions  Instructions:  Apply ice:   Purpose: This will minimize any swelling and discomfort after procedure.   When: Day of procedure, as soon as you get home.  How: Fill a plastic sandwich bag with crushed ice. Cover it with a small towel and apply to injection site.  How long: (15 min on, 15 min off) Apply for 15 minutes then remove x 15 minutes.  Repeat sequence on day of procedure, until you go to bed.  Apply heat:   Purpose: To treat any soreness and discomfort from the procedure.  When: Starting the next day after the procedure.  How: Apply heat to procedure site starting the day following the procedure.  How long: May continue to repeat daily, until discomfort goes away.  Food intake: Start with clear liquids (like water) and advance to regular food, as tolerated.   Physical activities: Keep activities to a minimum for the first 8 hours after the procedure. After that, then as tolerated.  Driving: If you have received any sedation, be responsible and do not drive. You are not allowed to drive for 24 hours after having sedation.  Blood thinner: (Applies only to those taking blood thinners) You may restart your blood thinner 6 hours after your procedure.  Insulin: (Applies only to Diabetic patients taking insulin) As soon as you can eat, you may resume your normal dosing schedule.  Infection prevention: Keep procedure site clean and dry. Shower daily and clean area with soap and water.  Post-procedure Pain Diary: Extremely important that this be done correctly and accurately. Recorded information will be used to determine the next step in treatment. For the purpose of accuracy, follow these rules:  Evaluate only the area treated. Do not report or include pain from an untreated area. For the purpose of this evaluation, ignore all other areas of pain,  except for the treated area.  After your procedure, avoid taking a long nap and attempting to complete the pain diary after you wake up. Instead, set your alarm clock to go off every hour, on the hour, for the initial 8 hours after the procedure. Document the duration of the numbing medicine, and the relief you are getting from it.  Do not go to sleep and attempt to complete it later. It will not be accurate. If you received sedation, it is likely that you were given a medication that may cause amnesia. Because of this, completing the diary at a later time may cause the information to be inaccurate. This information is needed to plan your care.  Follow-up appointment: Keep your post-procedure follow-up evaluation appointment after the procedure (usually 2 weeks for most procedures, 6 weeks for radiofrequencies). DO NOT FORGET to bring you pain diary with you.   Expect: (What should I expect to see with my procedure?)  From numbing medicine (AKA: Local Anesthetics): Numbness or decrease in pain. You may also experience some weakness, which if present, could last for the duration of the local anesthetic.  Onset: Full effect within 15 minutes of injected.  Duration: It will depend on the type of local anesthetic used. On the average, 1 to 8 hours.   From steroids (Applies only if steroids were used): Decrease in swelling or inflammation. Once inflammation is improved, relief of the pain will follow.  Onset of benefits: Depends on the amount of swelling present. The more swelling, the longer it will take for the benefits to be seen. In some cases, up to 10 days.  Duration: Steroids will stay in the system x 2 weeks. Duration of benefits will depend on multiple posibilities including persistent irritating factors.  Side-effects: If present, they may typically last 2 weeks (the duration of the steroids).  Frequent: Cramps (if they occur, drink Gatorade and take over-the-counter Magnesium 450-500 mg  once to twice a day); water retention with temporary weight gain; increases in blood sugar; decreased immune system response; increased appetite.  Occasional: Facial flushing (red, warm cheeks); mood swings; menstrual changes.  Uncommon: Long-term decrease or suppression of natural hormones; bone thinning. (These are more common with higher doses or more frequent use. This is why we prefer that our patients avoid having any injection therapies in other practices.)   Very Rare: Severe mood changes; psychosis; aseptic necrosis.  From procedure: Some discomfort is to be expected once the numbing medicine wears off. This should be minimal if ice and heat are applied as instructed.  Call if: (When should I call?)  You experience numbness and weakness that gets worse with time, as opposed to wearing off.  New onset bowel or bladder incontinence. (Applies only to procedures done in the spine)  Emergency Numbers:  Durning business hours (Monday - Thursday, 8:00 AM - 4:00 PM) (Friday, 9:00 AM - 12:00 Noon): [phone]  After hours: [phone]  NOTE: If you are having a problem and are unable connect with, or to talk to a provider, then go to your nearest urgent care or emergency department. If the problem is serious and urgent, please call 911. ____________________________________________________________________________________________   Pain Management Discharge Instructions  General Discharge Instructions :  If you need to reach your doctor call: Monday-Friday 8:00 am - 4:00 pm at [phone] or toll free [phone].  After clinic hours [phone] to have operator reach doctor.  Bring all of your medication bottles to all your appointments in the pain clinic.  To cancel or reschedule your appointment with Pain Management please remember to call 24 hours in advance to avoid a fee.  Refer to the educational materials which you have been given on: General Risks, I had my  Procedure. Discharge Instructions, Post Sedation.  Post Procedure Instructions:  The drugs you were given will stay in your system until tomorrow, so for the next 24 hours you should not drive, make any legal decisions or drink any alcoholic beverages.  You may eat anything you prefer, but it is better to start with liquids then soups and crackers, and gradually work up to solid foods.  Please notify your doctor immediately if you have any unusual bleeding, trouble breathing or pain that is not related to your normal pain.  Depending on the type of procedure that was done, some parts of your body may feel week and/or numb.  This usually clears up by tonight or the next day.  Walk with the use of an assistive device or accompanied by an adult for the 24 hours.  You may use ice on the affected area for the first 24 hours.  Put ice in a Ziploc bag and cover with a towel and place against area 15 minutes on 15 minutes off.  You may switch to heat after 24 hours.Epidural Steroid Injection Patient Information  Description: The epidural space surrounds the nerves as they exit the spinal cord.  In some patients, the nerves can be compressed and inflamed by a bulging disc or a tight spinal canal (spinal stenosis).  By injecting steroids into the epidural space, we can bring irritated nerves  into direct contact with a potentially helpful medication.  These steroids act directly on the irritated nerves and can reduce swelling and inflammation which often leads to decreased pain.  Epidural steroids may be injected anywhere along the spine and from the neck to the low back depending upon the location of your pain.   After numbing the skin with local anesthetic (like Novocaine), a small needle is passed into the epidural space slowly.  You may experience a sensation of pressure while this is being done.  The entire block usually last less than 10 minutes.  Conditions which may be treated by epidural  steroids:   Low back and leg pain  Neck and arm pain  Spinal stenosis  Post-laminectomy syndrome  Herpes zoster (shingles) pain  Pain from compression fractures  Preparation for the injection:  1. Do not eat any solid food or dairy products within 8 hours of your appointment.  2. You may drink clear liquids up to 3 hours before appointment.  Clear liquids include water, black coffee, juice or soda.  No milk or cream please. 3. You may take your regular medication, including pain medications, with a sip of water before your appointment  Diabetics should hold regular insulin (if taken separately) and take 1/2 normal NPH dos the morning of the procedure.  Carry some sugar containing items with you to your appointment. 4. A driver must accompany you and be prepared to drive you home after your procedure.  5. Bring all your current medications with your. 6. An IV may be inserted and sedation may be given at the discretion of the physician.   7. A blood pressure cuff, EKG and other monitors will often be applied during the procedure.  Some patients may need to have extra oxygen administered for a short period. 8. You will be asked to provide medical information, including your allergies, prior to the procedure.  We must know immediately if you are taking blood thinners (like Coumadin/Warfarin)  Or if you are allergic to IV iodine contrast (dye). We must know if you could possible be pregnant.  Possible side-effects:  Bleeding from needle site  Infection (rare, may require surgery)  Nerve injury (rare)  Numbness & tingling (temporary)  Difficulty urinating (rare, temporary)  Spinal headache ( a headache worse with upright posture)  Light -headedness (temporary)  Pain at injection site (several days)  Decreased blood pressure (temporary)  Weakness in arm/leg (temporary)  Pressure sensation in back/neck (temporary)  Call if you experience:  Fever/chills associated with  headache or increased back/neck pain.  Headache worsened by an upright position.  New onset weakness or numbness of an extremity below the injection site  Hives or difficulty breathing (go to the emergency room)  Inflammation or drainage at the infection site  Severe back/neck pain  Any new symptoms which are concerning to you  Please note:  Although the local anesthetic injected can often make your back or neck feel good for several hours after the injection, the pain will likely return.  It takes 3-7 days for steroids to work in the epidural space.  You may not notice any pain relief for at least that one week.  If effective, we will often do a series of three injections spaced 3-6 weeks apart to maximally decrease your pain.  After the initial series, we generally will wait several months before considering a repeat injection of the same type.  If you have any questions, please call [phone] [facility]

## 2019-07-30 NOTE — Progress Notes (Signed)
Patient's Name: Theresa [name]  MRN: [id number]  Referring Provider: Milinda Pointer, MD  DOB: 1956-02-25  PCP: Margo Common, PA  DOS: 07/30/2019  Note by: Gaspar Cola, MD  Service setting: Ambulatory outpatient  Specialty: Interventional Pain Management  Patient type: Established  Location: [facility] (AMB) Pain Management Facility  Visit type: Interventional Procedure   Primary Reason for Visit: Interventional Pain Management Treatment. CC: Back Pain (lower)  Procedure:          Anesthesia, Analgesia, Anxiolysis:  Type: Palliative Inter-Laminar Epidural Steroid Injection  #2  Region: Lumbar Level: L4-5 Level. Laterality: Left-Sided Paramedial  Type: Local Anesthesia Indication(s): Analgesia         Route: Infiltration ([place]/IM) IV Access: Declined Sedation: Declined  Local Anesthetic: Lidocaine 1-2%  Position: Prone with head of the table was raised to facilitate breathing.   Indications: 1. DDD (degenerative disc disease), lumbar   2. Chronic lower extremity pain (Secondary Area of Pain) (Left)   3. Chronic lumbar radicular pain (L4 & S1 Dermatome) (Left)   4. 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 Spinal stenosis of lumbar region with neurogenic claudication   7. Lumbar spondylosis (L4-5)   8. Lumbosacral radiculopathy at L4    Pain Score: Pre-procedure: 8 /10 Post-procedure: 6 /10   Pre-op Assessment:  Theresa [name] is a 63 y.o. (year old), female patient, seen today for interventional treatment. She  has a past surgical history that includes Abdominal hysterectomy; Tonsillectomy; Carpal tunnel release (Left, 2012); Tubal ligation; epigastric hernia repair (N/A, 08/13/2018); Umbilical hernia repair (N/A, 08/13/2018); and RIGHT/LEFT HEART CATH AND CORONARY ANGIOGRAPHY (N/A, 07/11/2019). Theresa [name] has a current medication list which includes the following prescription(s): alprazolam, aspirin ec,  baclofen, bisoprolol-hydrochlorothiazide, cholecalciferol, digoxin, fluticasone-umeclidin-vilant, gabapentin, ibuprofen, ipratropium-albuterol, levothyroxine, lidocaine, potassium chloride sa, prednisone, sertraline, torsemide, ventolin hfa, and hydrocodone-acetaminophen. Her primarily concern today is the Back Pain (lower)  Initial Vital Signs:  Pulse/HCG Rate: 65ECG Heart Rate: 64 Temp: 97.8 F (36.6 C) Resp: 16 BP: 110/62 SpO2: 93 %(O2 at 2.5 L/min)  BMI: Estimated body mass index is 44.26 kg/m as calculated from the following:   Height as of this encounter: _0  (1.575 m).   Weight as of this encounter: 242 lb (109.8 kg).  Risk Assessment: Allergies: Reviewed. She is allergic to codeine and meloxicam.  Allergy Precautions: None required Coagulopathies: Reviewed. None identified.  Blood-thinner therapy: None at this time Active Infection(s): Reviewed. None identified. Theresa [name] is afebrile  Site Confirmation: Theresa [name] was asked to confirm the procedure and laterality before marking the site Procedure checklist: Completed Consent: Before the procedure and under the influence of no sedative(s), amnesic(s), or anxiolytics, the patient was informed of the treatment options, risks and possible complications. To fulfill our ethical and legal obligations, as recommended by the American Medical Association's Code of Ethics, I have informed the patient of my clinical impression; the nature and purpose of the treatment or procedure; the risks, benefits, and possible complications of the intervention; the alternatives, including doing nothing; the risk(s) and benefit(s) of the alternative treatment(s) or procedure(s); and the risk(s) and benefit(s) of doing nothing. The patient was provided information about the general risks and possible complications associated with the procedure. These may include, but are not limited to: failure to achieve desired goals, infection, bleeding, organ or nerve  damage, allergic reactions, paralysis, and death. In addition, the patient was informed of those risks and complications associated to  Spine-related procedures, such as failure to decrease pain; infection (i.e.: Meningitis, epidural or intraspinal abscess); bleeding (i.e.: epidural hematoma, subarachnoid hemorrhage, or any other type of intraspinal or peri-dural bleeding); organ or nerve damage (i.e.: Any type of peripheral nerve, nerve root, or spinal cord injury) with subsequent damage to sensory, motor, and/or autonomic systems, resulting in permanent pain, numbness, and/or weakness of one or several areas of the body; allergic reactions; (i.e.: anaphylactic reaction); and/or death. Furthermore, the patient was informed of those risks and complications associated with the medications. These include, but are not limited to: allergic reactions (i.e.: anaphylactic or anaphylactoid reaction(s)); adrenal axis suppression; blood sugar elevation that in diabetics may result in ketoacidosis or comma; water retention that in patients with history of congestive heart failure may result in shortness of breath, pulmonary edema, and decompensation with resultant heart failure; weight gain; swelling or edema; medication-induced neural toxicity; particulate matter embolism and blood vessel occlusion with resultant organ, and/or nervous system infarction; and/or aseptic necrosis of one or more joints. Finally, the patient was informed that Medicine is not an exact science; therefore, there is also the possibility of unforeseen or unpredictable risks and/or possible complications that may result in a catastrophic outcome. The patient indicated having understood very clearly. We have given the patient no guarantees and we have made no promises. Enough time was given to the patient to ask questions, all of which were answered to the patient's satisfaction. Theresa [name] has indicated that she wanted to continue with the  procedure. Attestation: I, the ordering provider, attest that I have discussed with the patient the benefits, risks, side-effects, alternatives, likelihood of achieving goals, and potential problems during recovery for the procedure that I have provided informed consent. Date  Time: 07/30/2019 12:53 PM  Pre-Procedure Preparation:  Monitoring: As per clinic protocol. Respiration, ETCO2, SpO2, BP, heart rate and rhythm monitor placed and checked for adequate function Safety Precautions: Patient was assessed for positional comfort and pressure points before starting the procedure. Time-out: I initiated and conducted the "Time-out" before starting the procedure, as per protocol. The patient was asked to participate by confirming the accuracy of the "Time Out" information. Verification of the correct person, site, and procedure were performed and confirmed by me, the nursing staff, and the patient. "Time-out" conducted as per Joint Commission's Universal Protocol (UP.01.01.01). Time: 1327  Description of Procedure:          Target Area: The interlaminar space, initially targeting the lower laminar border of the superior vertebral body. Approach: Paramedial approach. Area Prepped: Entire Posterior Lumbar Region Prepping solution: DuraPrep (Iodine Povacrylex [0.7% available iodine] and Isopropyl Alcohol, 74% w/w) Safety Precautions: Aspiration looking for blood return was conducted prior to all injections. At no point did we inject any substances, as a needle was being advanced. No attempts were made at seeking any paresthesias. Safe injection practices and needle disposal techniques used. Medications properly checked for expiration dates. SDV (single dose vial) medications used. Description of the Procedure: Protocol guidelines were followed. The procedure needle was introduced through the skin, ipsilateral to the reported pain, and advanced to the target area. Bone was contacted and the needle walked  caudad, until the lamina was cleared. The epidural space was identified using "loss-of-resistance technique" with 2-3 ml of PF-NaCl (0.9% NSS), in a 5cc LOR glass syringe.  Vitals:   07/30/19 1251 07/30/19 1323 07/30/19 1328 07/30/19 1332  BP: 110/62 115/65 109/63 106/63  Pulse: 65     Resp: _0 (!) 21  Temp: 97.8 F (36.6 C)     TempSrc: Oral     SpO2: 93% 100% 100% 100%  Weight: 242 lb (109.8 kg)     Height: _0  (1.575 m)       Start Time: 1327 hrs. End Time: 1332 hrs.  Materials:  Needle(s) Type: Epidural needle Gauge: 17G Length: 15cm Medication(s): Please see orders for medications and dosing details.  Imaging Guidance (Spinal):          Type of Imaging Technique: Fluoroscopy Guidance (Spinal) Indication(s): Assistance in needle guidance and placement for procedures requiring needle placement in or near specific anatomical locations not easily accessible without such assistance. Exposure Time: Please see nurses notes. Contrast: Before injecting any contrast, we confirmed that the patient did not have an allergy to iodine, shellfish, or radiological contrast. Once satisfactory needle placement was completed at the desired level, radiological contrast was injected. Contrast injected under live fluoroscopy. No contrast complications. See chart for type and volume of contrast used. Fluoroscopic Guidance: I was personally present during the use of fluoroscopy. "Tunnel Vision Technique" used to obtain the best possible view of the target area. Parallax error corrected before commencing the procedure. "Direction-depth-direction" technique used to introduce the needle under continuous pulsed fluoroscopy. Once target was reached, antero-posterior, oblique, and lateral fluoroscopic projection used confirm needle placement in all planes. Images permanently stored in EMR. Interpretation: I personally interpreted the imaging intraoperatively. Adequate needle placement confirmed in  multiple planes. Appropriate spread of contrast into desired area was observed. No evidence of afferent or efferent intravascular uptake. No intrathecal or subarachnoid spread observed. Permanent images saved into the patient's record.  Antibiotic Prophylaxis:   Anti-infectives (From admission, onward)   None     Indication(s): None identified  Post-operative Assessment:  Post-procedure Vital Signs:  Pulse/HCG Rate: 65(!) 59 Temp: 97.8 F (36.6 C) Resp: (!) 21 BP: 106/63 SpO2: 100 %  EBL: None  Complications: No immediate post-treatment complications observed by team, or reported by patient.  Note: The patient tolerated the entire procedure well. A repeat set of vitals were taken after the procedure and the patient was kept under observation following institutional policy, for this type of procedure. Post-procedural neurological assessment was performed, showing return to baseline, prior to discharge. The patient was provided with post-procedure discharge instructions, including a section on how to identify potential problems. Should any problems arise concerning this procedure, the patient was given instructions to immediately contact us, at any time, without hesitation. In any case, we plan to contact the patient by telephone for a follow-up status report regarding this interventional procedure.  Comments:  No additional relevant information.  Plan of Care  Orders:  Orders Placed This Encounter  Procedures  . Lumbar Epidural Injection    Scheduling Instructions:     Procedure: Interlaminar LESI L4-5     Laterality: Left-sided     Sedation: No Sedation     Timeframe:  Today    Order Specific Question:   Where will this procedure be performed?    Answer:   [facility]  . DG PAIN [facility] C-ARM 1-60 MIN NO REPORT    Intraoperative interpretation by procedural physician at [place].    Standing Status:   Standing    Number of Occurrences:   1    Order  Specific Question:   Reason for exam:    Answer:   Assistance in needle guidance and placement for procedures requiring needle placement in or near specific anatomical locations  not easily accessible without such assistance.  . Informed Consent Details: Physician/Practitioner Attestation; Transcribe to consent form and obtain patient signature    Provider Attestation: I, Lemoyne Dossie Arbour, MD, (Pain Management Specialist), the physician/practitioner, attest that I have discussed with the patient the benefits, risks, side effects, alternatives, likelihood of achieving goals and potential problems during recovery for the procedure that I have provided informed consent.    Scheduling Instructions:     Procedure: Lumbar epidural steroid injection under fluoroscopic guidance     Indications: Low back and/or lower extremity pain secondary to lumbar radiculitis     Note: Always confirm laterality of pain with Ms. Alwyn Ren, before procedure.     Transcribe to consent form and obtain patient signature.  . Provide equipment / supplies at bedside    Equipment required: Single use, disposable, "Epidural Tray" Epidural Catheter: NOT required    Standing Status:   Standing    Number of Occurrences:   1    Order Specific Question:   Specify    Answer:   Epidural Tray  . Monitor O2 SATs    Discontinue once pulse oxymetry is back to normal levels for patient.    Standing Status:   Standing    Number of Occurrences:   1  . Oxygen therapy Mode or (Route): Nasal cannula; FiO2: 25%; Liters Per Minute: 2; Keep 02 saturation: Above 92%    Keep patient on oxygen by [place]. Discontinue once pulse oxymetry is back to normal levels for patient.    Standing Status:   Standing    Number of Occurrences:   1    Order Specific Question:   Mode or (Route)    Answer:   Nasal cannula    Order Specific Question:   FiO2    Answer:   25%    Order Specific Question:   Liters Per Minute    Answer:   2    Order Specific  Question:   Keep 02 saturation    Answer:   Above 92%   Chronic Opioid Analgesic:  Hydrocodone/APAP 5/325 one tablet every 8 hours (15 mg/day of hydrocodone) MME/day:15 mg/day.   Medications ordered for procedure: Meds ordered this encounter  Medications  . iohexol (OMNIPAQUE) 180 MG/ML injection 10 mL    Must be Myelogram-compatible. If not available, you may substitute with a water-soluble, non-ionic, hypoallergenic, myelogram-compatible radiological contrast medium.  Marland Kitchen lidocaine (XYLOCAINE) 2 % (with pres) injection 400 mg  . sodium chloride flush (NS) 0.9 % injection 2 mL  . ropivacaine (PF) 2 mg/mL (0.2%) (NAROPIN) injection 2 mL  . triamcinolone acetonide (KENALOG-40) injection 40 mg   Medications administered: We administered iohexol, lidocaine, sodium chloride flush, ropivacaine (PF) 2 mg/mL (0.2%), and triamcinolone acetonide.  See the medical record for exact dosing, route, and time of administration.  Follow-up plan:   Return in about 2 weeks (around 08/13/2019) for (VV), (PP).       Interventional management options: Planned, scheduled, and/or pending: NOTE: NO further Lumbar RFAuntil BMI <35.    Considering: Palliative procedures    Palliative PRN treatment(s): Palliativebilaterallumbar facetblock #4 Diagnostic/palliative left L4TFESI #2 Diagnostic/palliative left L4-5 LESI #3 Diagnostic/palliativerightL4 TFESI #2 Diagnostic/palliativerightL4-5 LESI #2 Palliative leftlumbar facet RFA#2(done on 06/27/2017) Palliative right lumbar facet RFA#2(done on 10/03/2017)    Recent Visits Date Type Provider Dept  07/23/19 Office Visit Milinda Pointer, [facility]  05/06/19 Office Visit Milinda Pointer, MD [facility]  Showing recent visits within past 90 days and meeting  all other requirements   Today's Visits Date Type Provider Dept  07/30/19 Procedure visit Milinda Pointer, MD [facility]  Showing  today's visits and meeting all other requirements   Future Appointments Date Type Provider Dept  08/14/19 Appointment Milinda Pointer, MD [facility]  Showing future appointments within next 90 days and meeting all other requirements   Disposition: Discharge home  Discharge Date & Time: 07/30/2019; 1345 hrs.   Primary Care Physician: Margo Common, PA Location: [facility] Note by: Gaspar Cola, MD Date: 07/30/2019; Time: 1:49 PM  Disclaimer:  Medicine is not an Chief Strategy Officer. The only guarantee in medicine is that nothing is guaranteed. It is important to note that the decision to proceed with this intervention was based on the information collected from the patient. The Data and conclusions were drawn from the patient's questionnaire, the interview, and the physical examination. Because the information was provided in large part by the patient, it cannot be guaranteed that it has not been purposely or unconsciously manipulated. Every effort has been made to obtain as much relevant data as possible for this evaluation. It is important to note that the conclusions that lead to this procedure are derived in large part from the available data. Always take into account that the treatment will also be dependent on availability of resources and existing treatment guidelines, considered by other Pain Management Practitioners as being common knowledge and practice, at the time of the intervention. For Medico-Legal purposes, it is also important to point out that variation in procedural techniques and pharmacological choices are the acceptable norm. The indications, contraindications, technique, and results of the above procedure should only be interpreted and judged by a Board-Certified Interventional Pain Specialist with extensive familiarity and expertise in the same exact procedure and technique.

## 2019-07-31 ENCOUNTER — Telehealth: Payer: Self-pay

## 2019-07-31 NOTE — Telephone Encounter (Signed)
Pt was called and no problem was reported.

## 2019-08-01 ENCOUNTER — Other Ambulatory Visit: Payer: Self-pay

## 2019-08-01 DIAGNOSIS — M79652 Pain in left thigh: Secondary | ICD-10-CM

## 2019-08-01 DIAGNOSIS — J9610 Chronic respiratory failure, unspecified whether with hypoxia or hypercapnia: Secondary | ICD-10-CM | POA: Diagnosis not present

## 2019-08-01 MED ORDER — BACLOFEN 10 MG PO TABS: 10.0000 mg | ORAL_TABLET | Freq: Three times a day (TID) | ORAL | 0 refills | Status: DC | PRN

## 2019-08-01 NOTE — Telephone Encounter (Signed)
Patient request refill. Patient states she is still having spasms in her leg. He had an epidural on Tuesday, but feels like it is not helping.

## 2019-08-02 DIAGNOSIS — G8929 Other chronic pain: Secondary | ICD-10-CM | POA: Diagnosis not present

## 2019-08-02 DIAGNOSIS — M5137 Other intervertebral disc degeneration, lumbosacral region: Secondary | ICD-10-CM | POA: Diagnosis not present

## 2019-08-02 DIAGNOSIS — J441 Chronic obstructive pulmonary disease with (acute) exacerbation: Secondary | ICD-10-CM | POA: Diagnosis not present

## 2019-08-02 DIAGNOSIS — F419 Anxiety disorder, unspecified: Secondary | ICD-10-CM | POA: Diagnosis not present

## 2019-08-02 DIAGNOSIS — J9611 Chronic respiratory failure with hypoxia: Secondary | ICD-10-CM | POA: Diagnosis not present

## 2019-08-02 DIAGNOSIS — I110 Hypertensive heart disease with heart failure: Secondary | ICD-10-CM | POA: Diagnosis not present

## 2019-08-02 DIAGNOSIS — I5032 Chronic diastolic (congestive) heart failure: Secondary | ICD-10-CM | POA: Diagnosis not present

## 2019-08-02 DIAGNOSIS — E039 Hypothyroidism, unspecified: Secondary | ICD-10-CM | POA: Diagnosis not present

## 2019-08-02 DIAGNOSIS — M5030 Other cervical disc degeneration, unspecified cervical region: Secondary | ICD-10-CM | POA: Diagnosis not present

## 2019-08-04 DIAGNOSIS — J9610 Chronic respiratory failure, unspecified whether with hypoxia or hypercapnia: Secondary | ICD-10-CM | POA: Diagnosis not present

## 2019-08-05 DIAGNOSIS — I110 Hypertensive heart disease with heart failure: Secondary | ICD-10-CM | POA: Diagnosis not present

## 2019-08-05 DIAGNOSIS — F419 Anxiety disorder, unspecified: Secondary | ICD-10-CM | POA: Diagnosis not present

## 2019-08-05 DIAGNOSIS — J9611 Chronic respiratory failure with hypoxia: Secondary | ICD-10-CM | POA: Diagnosis not present

## 2019-08-05 DIAGNOSIS — I5032 Chronic diastolic (congestive) heart failure: Secondary | ICD-10-CM | POA: Diagnosis not present

## 2019-08-05 DIAGNOSIS — M5137 Other intervertebral disc degeneration, lumbosacral region: Secondary | ICD-10-CM | POA: Diagnosis not present

## 2019-08-05 DIAGNOSIS — J441 Chronic obstructive pulmonary disease with (acute) exacerbation: Secondary | ICD-10-CM | POA: Diagnosis not present

## 2019-08-05 DIAGNOSIS — M5030 Other cervical disc degeneration, unspecified cervical region: Secondary | ICD-10-CM | POA: Diagnosis not present

## 2019-08-05 DIAGNOSIS — G8929 Other chronic pain: Secondary | ICD-10-CM | POA: Diagnosis not present

## 2019-08-05 DIAGNOSIS — E039 Hypothyroidism, unspecified: Secondary | ICD-10-CM | POA: Diagnosis not present

## 2019-08-08 DIAGNOSIS — M5416 Radiculopathy, lumbar region: Secondary | ICD-10-CM | POA: Diagnosis not present

## 2019-08-09 ENCOUNTER — Other Ambulatory Visit: Payer: Self-pay

## 2019-08-09 DIAGNOSIS — M5030 Other cervical disc degeneration, unspecified cervical region: Secondary | ICD-10-CM | POA: Diagnosis not present

## 2019-08-09 DIAGNOSIS — F419 Anxiety disorder, unspecified: Secondary | ICD-10-CM | POA: Diagnosis not present

## 2019-08-09 DIAGNOSIS — M79652 Pain in left thigh: Secondary | ICD-10-CM

## 2019-08-09 DIAGNOSIS — J441 Chronic obstructive pulmonary disease with (acute) exacerbation: Secondary | ICD-10-CM | POA: Diagnosis not present

## 2019-08-09 DIAGNOSIS — J9611 Chronic respiratory failure with hypoxia: Secondary | ICD-10-CM | POA: Diagnosis not present

## 2019-08-09 DIAGNOSIS — G8929 Other chronic pain: Secondary | ICD-10-CM | POA: Diagnosis not present

## 2019-08-09 DIAGNOSIS — E039 Hypothyroidism, unspecified: Secondary | ICD-10-CM | POA: Diagnosis not present

## 2019-08-09 DIAGNOSIS — I110 Hypertensive heart disease with heart failure: Secondary | ICD-10-CM | POA: Diagnosis not present

## 2019-08-09 DIAGNOSIS — M5137 Other intervertebral disc degeneration, lumbosacral region: Secondary | ICD-10-CM | POA: Diagnosis not present

## 2019-08-09 DIAGNOSIS — I5032 Chronic diastolic (congestive) heart failure: Secondary | ICD-10-CM | POA: Diagnosis not present

## 2019-08-09 MED ORDER — BACLOFEN 10 MG PO TABS: 10.0000 mg | ORAL_TABLET | Freq: Three times a day (TID) | ORAL | 0 refills | Status: DC | PRN

## 2019-08-13 ENCOUNTER — Encounter: Payer: Self-pay | Admitting: Pain Medicine

## 2019-08-14 ENCOUNTER — Telehealth: Payer: Self-pay | Admitting: *Deleted

## 2019-08-14 ENCOUNTER — Other Ambulatory Visit: Payer: Self-pay

## 2019-08-14 ENCOUNTER — Ambulatory Visit: Payer: Medicare HMO | Attending: Pain Medicine | Admitting: Pain Medicine

## 2019-08-14 DIAGNOSIS — M79652 Pain in left thigh: Secondary | ICD-10-CM | POA: Diagnosis not present

## 2019-08-14 DIAGNOSIS — G894 Chronic pain syndrome: Secondary | ICD-10-CM

## 2019-08-14 DIAGNOSIS — M47816 Spondylosis without myelopathy or radiculopathy, lumbar region: Secondary | ICD-10-CM

## 2019-08-14 DIAGNOSIS — M25551 Pain in right hip: Secondary | ICD-10-CM

## 2019-08-14 DIAGNOSIS — J9611 Chronic respiratory failure with hypoxia: Secondary | ICD-10-CM | POA: Diagnosis not present

## 2019-08-14 DIAGNOSIS — J441 Chronic obstructive pulmonary disease with (acute) exacerbation: Secondary | ICD-10-CM | POA: Diagnosis not present

## 2019-08-14 DIAGNOSIS — M792 Neuralgia and neuritis, unspecified: Secondary | ICD-10-CM | POA: Diagnosis not present

## 2019-08-14 DIAGNOSIS — I110 Hypertensive heart disease with heart failure: Secondary | ICD-10-CM | POA: Diagnosis not present

## 2019-08-14 DIAGNOSIS — M545 Low back pain: Secondary | ICD-10-CM | POA: Diagnosis not present

## 2019-08-14 DIAGNOSIS — F419 Anxiety disorder, unspecified: Secondary | ICD-10-CM | POA: Diagnosis not present

## 2019-08-14 DIAGNOSIS — G8929 Other chronic pain: Secondary | ICD-10-CM | POA: Insufficient documentation

## 2019-08-14 DIAGNOSIS — M79605 Pain in left leg: Secondary | ICD-10-CM | POA: Diagnosis not present

## 2019-08-14 DIAGNOSIS — M5416 Radiculopathy, lumbar region: Secondary | ICD-10-CM | POA: Diagnosis not present

## 2019-08-14 DIAGNOSIS — M25562 Pain in left knee: Secondary | ICD-10-CM | POA: Diagnosis not present

## 2019-08-14 DIAGNOSIS — I5032 Chronic diastolic (congestive) heart failure: Secondary | ICD-10-CM | POA: Diagnosis not present

## 2019-08-14 DIAGNOSIS — R937 Abnormal findings on diagnostic imaging of other parts of musculoskeletal system: Secondary | ICD-10-CM | POA: Insufficient documentation

## 2019-08-14 DIAGNOSIS — M5137 Other intervertebral disc degeneration, lumbosacral region: Secondary | ICD-10-CM | POA: Diagnosis not present

## 2019-08-14 DIAGNOSIS — M25552 Pain in left hip: Secondary | ICD-10-CM

## 2019-08-14 DIAGNOSIS — M5030 Other cervical disc degeneration, unspecified cervical region: Secondary | ICD-10-CM | POA: Diagnosis not present

## 2019-08-14 DIAGNOSIS — E039 Hypothyroidism, unspecified: Secondary | ICD-10-CM | POA: Diagnosis not present

## 2019-08-14 MED ORDER — HYDROCODONE-ACETAMINOPHEN 5-325 MG PO TABS: 1.0000 | ORAL_TABLET | Freq: Three times a day (TID) | ORAL | 0 refills | Status: DC | PRN

## 2019-08-14 MED ORDER — BACLOFEN 10 MG PO TABS: 10.0000 mg | ORAL_TABLET | Freq: Three times a day (TID) | ORAL | 5 refills | Status: DC

## 2019-08-14 MED ORDER — GABAPENTIN 300 MG PO CAPS: 300.0000 mg | ORAL_CAPSULE | Freq: Four times a day (QID) | ORAL | 5 refills | Status: DC

## 2019-08-14 NOTE — Patient Instructions (Signed)
____________________________________________________________________________________________  Preparing for your procedure (without sedation)  Procedure appointments are limited to planned procedures: . No Prescription Refills. . No disability issues will be discussed. . No medication changes will be discussed.  Instructions: . Oral Intake: Do not eat or drink anything for at least 3 hours prior to your procedure. . Transportation: Unless otherwise stated by your physician, you may drive yourself after the procedure. . Blood Pressure Medicine: Take your blood pressure medicine with a sip of water the morning of the procedure. . Blood thinners: Notify our staff if you are taking any blood thinners. Depending on which one you take, there will be specific instructions on how and when to stop it. . Diabetics on insulin: Notify the staff so that you can be scheduled 1st case in the morning. If your diabetes requires high dose insulin, take only  of your normal insulin dose the morning of the procedure and notify the staff that you have done so. . Preventing infections: Shower with an antibacterial soap the morning of your procedure.  . Build-up your immune system: Take 1000 mg of Vitamin C with every meal (3 times a day) the day prior to your procedure. Marland Kitchen Antibiotics: Inform the staff if you have a condition or reason that requires you to take antibiotics before dental procedures. . Pregnancy: If you are pregnant, call and cancel the procedure. . Sickness: If you have a cold, fever, or any active infections, call and cancel the procedure. . Arrival: You must be in the facility at least 30 minutes prior to your scheduled procedure. . Children: Do not bring any children with you. . Dress appropriately: Bring dark clothing that you would not mind if they get stained. . Valuables: Do not bring any jewelry or valuables.  Reasons to call and reschedule or cancel your procedure: (Following these  recommendations will minimize the risk of a serious complication.) . Surgeries: Avoid having procedures within 2 weeks of any surgery. (Avoid for 2 weeks before or after any surgery). . Flu Shots: Avoid having procedures within 2 weeks of a flu shots or . (Avoid for 2 weeks before or after immunizations). . Barium: Avoid having a procedure within 7-10 days after having had a radiological study involving the use of radiological contrast. (Myelograms, Barium swallow or enema study). . Heart attacks: Avoid any elective procedures or surgeries for the initial 6 months after a "Myocardial Infarction" (Heart Attack). . Blood thinners: It is imperative that you stop these medications before procedures. Let us know if you if you take any blood thinner.  . Infection: Avoid procedures during or within two weeks of an infection (including chest colds or gastrointestinal problems). Symptoms associated with infections include: Localized redness, fever, chills, night sweats or profuse sweating, burning sensation when voiding, cough, congestion, stuffiness, runny nose, sore throat, diarrhea, nausea, vomiting, cold or Flu symptoms, recent or current infections. It is specially important if the infection is over the area that we intend to treat. Marland Kitchen Heart and lung problems: Symptoms that may suggest an active cardiopulmonary problem include: cough, chest pain, breathing difficulties or shortness of breath, dizziness, ankle swelling, uncontrolled high or unusually low blood pressure, and/or palpitations. If you are experiencing any of these symptoms, cancel your procedure and contact your primary care physician for an evaluation.  Remember:  Regular Business hours are:  Monday to Thursday 8:00 AM to 4:00 PM  Provider's Schedule: Milinda Pointer, MD:  Procedure days: Tuesday and Thursday 7:30 AM to 4:00 PM  [place]  Holley Raring, MD:  Procedure days: Monday and Wednesday 7:30 AM to 4:00  PM ____________________________________________________________________________________________   ____________________________________________________________________________________________  Medication Rules  Purpose: To inform patients, and their family members, of our rules and regulations.  Applies to: All patients receiving prescriptions (written or electronic).  Pharmacy of record: Pharmacy where electronic prescriptions will be sent. If written prescriptions are taken to a different pharmacy, please inform the nursing staff. The pharmacy listed in the electronic medical record should be the one where you would like electronic prescriptions to be sent.  Electronic prescriptions: In compliance with the [place] (STOP) Act of 2017 (Session Lanny Cramp [id number]), effective October 17, 2018, all controlled substances must be electronically prescribed. Calling prescriptions to the pharmacy will cease to exist.  Prescription refills: Only during scheduled appointments. Applies to all prescriptions.  NOTE: The following applies primarily to controlled substances (Opioid* Pain Medications).   Patient's responsibilities: 1. Pain Pills: Bring all pain pills to every appointment (except for procedure appointments). 2. Pill Bottles: Bring pills in original pharmacy bottle. Always bring the newest bottle. Bring bottle, even if empty. 3. Medication refills: You are responsible for knowing and keeping track of what medications you take and those you need refilled. The day before your appointment: write a list of all prescriptions that need to be refilled. The day of the appointment: give the list to the admitting nurse. Prescriptions will be written only during appointments. No prescriptions will be written on procedure days. If you forget a medication: it will not be "Called in", "Faxed", or "electronically sent". You will need to get another appointment to get  these prescribed. No early refills. Do not call asking to have your prescription filled early. 4. Prescription Accuracy: You are responsible for carefully inspecting your prescriptions before leaving our office. Have the discharge nurse carefully go over each prescription with you, before taking them home. Make sure that your name is accurately spelled, that your address is correct. Check the name and dose of your medication to make sure it is accurate. Check the number of pills, and the written instructions to make sure they are clear and accurate. Make sure that you are given enough medication to last until your next medication refill appointment. 5. Taking Medication: Take medication as prescribed. When it comes to controlled substances, taking less pills or less frequently than prescribed is permitted and encouraged. Never take more pills than instructed. Never take medication more frequently than prescribed.  6. Inform other Doctors: Always inform, all of your healthcare providers, of all the medications you take. 7. Pain Medication from other Providers: You are not allowed to accept any additional pain medication from any other Doctor or Healthcare provider. There are two exceptions to this rule. (see below) In the event that you require additional pain medication, you are responsible for notifying us, as stated below. 8. Medication Agreement: You are responsible for carefully reading and following our Medication Agreement. This must be signed before receiving any prescriptions from our practice. Safely store a copy of your signed Agreement. Violations to the Agreement will result in no further prescriptions. (Additional copies of our Medication Agreement are available upon request.) 9. Laws, Rules, & Regulations: All patients are expected to follow all Federal and [employer], [employer], Rules, [employer]. Ignorance of the Laws does not constitute a valid excuse. The use of any illegal substances is  prohibited. 10. Adopted CDC guidelines & recommendations: Target dosing levels will be at or below 60 MME/day. Use  of benzodiazepines** is not recommended.  Exceptions: There are only two exceptions to the rule of not receiving pain medications from other Healthcare Providers. 1. Exception #1 (Emergencies): In the event of an emergency (i.e.: accident requiring emergency care), you are allowed to receive additional pain medication. However, you are responsible for: As soon as you are able, call our office [phone], at any time of the day or night, and leave a message stating your name, the date and nature of the emergency, and the name and dose of the medication prescribed. In the event that your call is answered by a member of our staff, make sure to document and save the date, time, and the name of the person that took your information.  2. Exception #2 (Planned Surgery): In the event that you are scheduled by another doctor or dentist to have any type of surgery or procedure, you are allowed (for a period no longer than 30 days), to receive additional pain medication, for the acute post-op pain. However, in this case, you are responsible for picking up a copy of our "Post-op Pain Management for Surgeons" handout, and giving it to your surgeon or dentist. This document is available at our office, and does not require an appointment to obtain it. Simply go to our office during business hours (Monday-Thursday from 8:00 AM to 4:00 PM) (Friday 8:00 AM to 12:00 Noon) or if you have a scheduled appointment with [place], prior to your surgery, and ask for it by name. In addition, you will need to provide [place] with your name, name of your surgeon, type of surgery, and date of procedure or surgery.  *Opioid medications include: morphine, codeine, oxycodone, oxymorphone, hydrocodone, hydromorphone, meperidine, tramadol, tapentadol, buprenorphine, fentanyl, methadone. **Benzodiazepine medications include: diazepam  (Valium), alprazolam (Xanax), clonazepam (Klonopine), lorazepam (Ativan), clorazepate (Tranxene), chlordiazepoxide (Librium), estazolam (Prosom), oxazepam (Serax), temazepam (Restoril), triazolam (Halcion) (Last updated: 12/14/2017) ____________________________________________________________________________________________   ____________________________________________________________________________________________  Medication Recommendations and Reminders  Applies to: All patients receiving prescriptions (written and/or electronic).  Medication Rules & Regulations: These rules and regulations exist for your safety and that of others. They are not flexible and neither are we. Dismissing or ignoring them will be considered "non-compliance" with medication therapy, resulting in complete and irreversible termination of such therapy. (See document titled "Medication Rules" for more details.) In all conscience, because of safety reasons, we cannot continue providing a therapy where the patient does not follow instructions.  Pharmacy of record:   Definition: This is the pharmacy where your electronic prescriptions will be sent.   We do not endorse any particular pharmacy.  You are not restricted in your choice of pharmacy.  The pharmacy listed in the electronic medical record should be the one where you want electronic prescriptions to be sent.  If you choose to change pharmacy, simply notify our nursing staff of your choice of new pharmacy.  Recommendations:  Keep all of your pain medications in a safe place, under lock and key, even if you live alone.   After you fill your prescription, take 1 week's worth of pills and put them away in a safe place. You should keep a separate, properly labeled bottle for this purpose. The remainder should be kept in the original bottle. Use this as your primary supply, until it runs out. Once it's gone, then you know that you have 1 week's worth of  medicine, and it is time to come in for a prescription refill. If you do this correctly, it is unlikely  that you will ever run out of medicine.  To make sure that the above recommendation works, it is very important that you make sure your medication refill appointments are scheduled at least 1 week before you run out of medicine. To do this in an effective manner, make sure that you do not leave the office without scheduling your next medication management appointment. Always ask the nursing staff to show you in your prescription , when your medication will be running out. Then arrange for the receptionist to get you a return appointment, at least 7 days before you run out of medicine. Do not wait until you have 1 or 2 pills left, to come in. This is very poor planning and does not take into consideration that we may need to cancel appointments due to bad weather, sickness, or emergencies affecting our staff.  "Partial Fill": If for any reason your pharmacy does not have enough pills/tablets to completely fill or refill your prescription, do not allow for a "partial fill". You will need a separate prescription to fill the remaining amount, which we will not provide. If the reason for the partial fill is your insurance, you will need to talk to the pharmacist about payment alternatives for the remaining tablets, but again, do not accept a partial fill.  Prescription refills and/or changes in medication(s):   Prescription refills, and/or changes in dose or medication, will be conducted only during scheduled medication management appointments. (Applies to both, written and electronic prescriptions.)  No refills on procedure days. No medication will be changed or started on procedure days. No changes, adjustments, and/or refills will be conducted on a procedure day. Doing so will interfere with the diagnostic portion of the procedure.  No phone refills. No medications will be "called into the  pharmacy".  No Fax refills.  No weekend refills.  No Holliday refills.  No after hours refills.  Remember:  Business hours are:  Monday to Thursday 8:00 AM to 4:00 PM Provider's Schedule: Milinda Pointer, MD - Appointments are:  Medication management: Monday and Wednesday 8:00 AM to 4:00 PM Procedure day: Tuesday and Thursday 7:30 AM to 4:00 PM Gillis Santa, MD - Appointments are:  Medication management: Tuesday and Thursday 8:00 AM to 4:00 PM Procedure day: Monday and Wednesday 7:30 AM to 4:00 PM (Last update: 12/14/2017) ____________________________________________________________________________________________   ____________________________________________________________________________________________  CANNABIDIOL (AKA: CBD Oil or Pills)  Applies to: All patients receiving prescriptions of controlled substances (written and/or electronic).  General Information: Cannabidiol (CBD) was discovered in 13. It is one of some 113 identified cannabinoids in cannabis (Marijuana) plants, accounting for up to 40% of the plant's extract. As of 2018, preliminary clinical research on cannabidiol included studies of anxiety, cognition, movement disorders, and pain.  Cannabidiol is consummed in multiple ways, including inhalation of cannabis smoke or vapor, as an aerosol spray into the cheek, and by mouth. It may be supplied as CBD oil containing CBD as the active ingredient (no added tetrahydrocannabinol (THC) or terpenes), a full-plant CBD-dominant hemp extract oil, capsules, dried cannabis, or as a liquid solution. CBD is thought not have the same psychoactivity as THC, and may affect the actions of THC. Studies suggest that CBD may interact with different biological targets, including cannabinoid receptors and other neurotransmitter receptors. As of 2018 the mechanism of action for its biological effects has not been determined.  In the [place], cannabidiol has a limited approval  by the Food and Drug Administration (FDA) for treatment of only two types  of epilepsy disorders. The side effects of long-term use of the drug include somnolence, decreased appetite, diarrhea, fatigue, malaise, weakness, sleeping problems, and others.  CBD remains a Schedule I drug prohibited for any use.  Legality: Some manufacturers ship CBD products nationally, an illegal action which the FDA has not enforced in 2018, with CBD remaining the subject of an FDA investigational new drug evaluation, and is not considered legal as a dietary supplement or food ingredient as of December 2018. Federal illegality has made it difficult historically to conduct research on CBD. CBD is openly sold in head shops and health food stores in some states where such sales have not been explicitly legalized.  Warning: Because it is not FDA approved for general use or treatment of pain, it is not required to undergo the same manufacturing controls as prescription drugs.  This means that the available cannabidiol (CBD) may be contaminated with THC.  If this is the case, it will trigger a positive urine drug screen (UDS) test for cannabinoids (Marijuana).  Because a positive UDS for illicit substances is a violation of our medication agreement, your opioid analgesics (pain medicine) may be permanently discontinued. (Last update: 01/04/2018) ____________________________________________________________________________________________

## 2019-08-14 NOTE — Progress Notes (Signed)
Pain Management Virtual Encounter Note - Virtual Visit via Telephone Telehealth (real-time audio visits between healthcare provider and patient).   Patient's Phone No. & Preferred Pharmacy:  [phone] (home); [phone] (mobile); (Preferred) [phone] [id number] [address], [address] [place] [address] Phone: [phone] Fax: [phone]    Pre-screening note:  Our staff contacted Theresa [name] and offered her an "in person", "face-to-face" appointment versus a telephone encounter. She indicated preferring the telephone encounter, at this time.   Reason for Virtual Visit: COVID-19*  Social distancing based on CDC and AMA recommendations.   I contacted Theresa [name] on 08/14/2019 via telephone.      I clearly identified myself as Gaspar Cola, MD. I verified that I was speaking with the correct person using two identifiers (Name: Theresa [name], and date of birth: 01/19/1956).  Advanced Informed Consent I sought verbal advanced consent from Theresa [name] for virtual visit interactions. I informed Theresa [name] of possible security and privacy concerns, risks, and limitations associated with providing "not-in-person" medical evaluation and management services. I also informed Theresa [name] of the availability of "in-person" appointments. Finally, I informed her that there would be a charge for the virtual visit and that she could be  personally, fully or partially, financially responsible for it. Theresa [name] expressed understanding and agreed to proceed.   Historic Elements   Theresa [name] is a 63 y.o. year old, female patient evaluated today after her last encounter by our practice on 07/31/2019. Theresa [name]  has a past medical history of Acute postoperative pain (10/03/2017), Anxiety, BiPAP (biphasic positive airway pressure) dependence, Carpal tunnel syndrome, CHF  (congestive heart failure) ([place]), CHF (congestive heart failure) ([place]), Chronic generalized abdominal pain, Chronic rhinitis, COPD (chronic obstructive pulmonary disease) ([place]), DDD (degenerative disc disease), cervical, DDD (degenerative disc disease), lumbosacral, Depression, Dyspnea, Edema, Flu, Gastritis, GERD (gastroesophageal reflux disease), Hematuria, Hernia of abdominal wall (07/17/2018), Hernia, abdominal, Hypertension, Kidney stones, Low back pain, Lumbar radiculitis, Malodorous urine, Muscle weakness, Obesity, Oxygen dependent, Renal cyst, Sensory urge incontinence, Thyroid activity decreased, Tobacco abuse, and Wheezing. She also  has a past surgical history that includes Abdominal hysterectomy; Tonsillectomy; Carpal tunnel release (Left, 2012); Tubal ligation; epigastric hernia repair (N/A, 08/13/2018); Umbilical hernia repair (N/A, 08/13/2018); and RIGHT/LEFT HEART CATH AND CORONARY ANGIOGRAPHY (N/A, 07/11/2019). Theresa [name] has a current medication list which includes the following prescription(s): alprazolam, aspirin ec, baclofen, bisoprolol-hydrochlorothiazide, cholecalciferol, digoxin, fluticasone-umeclidin-vilant, gabapentin, ibuprofen, ipratropium-albuterol, levothyroxine, lidocaine, potassium chloride sa, sertraline, torsemide, ventolin hfa, hydrocodone-acetaminophen, hydrocodone-acetaminophen, and hydrocodone-acetaminophen. She  reports that she has been smoking cigarettes. She has a 44.00 pack-year smoking history. She has never used smokeless tobacco. She reports that she does not drink alcohol or use drugs. Theresa [name] is allergic to codeine and meloxicam.   HPI  Today, she is being contacted for both, medication management and a post-procedure assessment.  The patient describes that since the last time that we spoke, she had another fall.  She describes that she was standing, holding to her walker, when she turned in her left knee, without any type of warning, gave away and she ended up  falling.  There is an x-ray of the knee that was negative, but she continues to have pain in that area.  Therefore we will go ahead and order an MRI to evaluate for soft tissue injuries that may be causing instability of her left knee.  The patient has a recent MRI of the lumbar spine done on 07/23/2019 that reveals that she has some disc extrusions at the L2-3 and L3-4 levels, on the left side.  The patient describes that she was evaluated by a neurosurgeon, Dr. Cari Caraway, who indicated that he wanted her to have some physical therapy and see if she progressed and improved with that and if not, he would consider surgery.  However, because of the patient's medical problems, he needs to have clearance from her pulmonologist, due to her severe COPD.  Regarding her physical therapy, she indicates that it has helped her right leg, but not the left.  If anything she feels that it has made it worse.  The diagnostic left-sided L4-5 LESI did not provide the patient with any significant relief of the pain and the reason may be because the etiology of this pain may not reside in the lower lumbar levels, but more so at the L2-3 and L3-4 levels.  Today I have offered to go back in do a left-sided lumbar epidural steroid injection at the L2-3 level and see if that helps.  Meanwhile, we will be ordering her MRI of the knee to see if that helps figure out what is going on with her knee pain and the instability that we are seeing.  The patient will also continue to follow-up with Dr. Cari Caraway and the physical therapy.  Post-Procedure Evaluation  Procedure: Diagnostic/therapeutic left-sided L4-5 LESI #2 under fluoroscopic guidance, no sedation Pre-procedure pain level:  8/10 Post-procedure: 6/10 (< 50% relief)  Sedation: None.  Effectiveness during initial hour after procedure(Ultra-Short Term Relief): 100 %   Local anesthetic used: Long-acting (4-6 hours) Effectiveness: Defined as any analgesic benefit obtained  secondary to the administration of local anesthetics. This carries significant diagnostic value as to the etiological location, or anatomical origin, of the pain. Duration of benefit is expected to coincide with the duration of the local anesthetic used.  Effectiveness during initial 4-6 hours after procedure(Short-Term Relief): 100 %   Long-term benefit: Defined as any relief past the pharmacologic duration of the local anesthetics.  Effectiveness past the initial 6 hours after procedure(Long-Term Relief): 0 % next day  Current benefits: Defined as benefit that persist at this time.   Analgesia:  Back to baseline Function: Back to baseline ROM: Back to baseline  Pharmacotherapy Assessment  Analgesic: Hydrocodone/APAP 5/325 one tablet every 8 hours (15 mg/day of hydrocodone) MME/day:15 mg/day.   Monitoring: Pharmacotherapy: No side-effects or adverse reactions reported. [place] PMP: PDMP reviewed during this encounter.       Compliance: No problems identified. Effectiveness: Clinically acceptable. Plan: Refer to "POC".  UDS:  Summary  Date Value Ref Range Status  07/31/2018 FINAL  Final    Comment:    ==================================================================== TOXASSURE SELECT 13 (MW) ==================================================================== Test                             Result       Flag       Units Drug Present and Declared for Prescription Verification   Hydrocodone                    688          EXPECTED   ng/mg creat   Norhydrocodone                 552          EXPECTED  ng/mg creat    Sources of hydrocodone include scheduled prescription    medications. Norhydrocodone is an expected metabolite of    hydrocodone. Drug Absent but Declared for Prescription Verification   Alprazolam                     Not Detected UNEXPECTED ng/mg creat ==================================================================== Test                      Result    Flag   Units       Ref Range   Creatinine              25               mg/dL      >=20 ==================================================================== Declared Medications:  The flagging and interpretation on this report are based on the  following declared medications.  Unexpected results may arise from  inaccuracies in the declared medications.  **Note: The testing scope of this panel includes these medications:  Alprazolam  Hydrocodone (Hydrocodone-Acetaminophen)  **Note: The testing scope of this panel does not include following  reported medications:  Acetaminophen (Hydrocodone-Acetaminophen)  Albuterol  Albuterol (Ipratropium-Albuterol)  Aspirin (Aspirin 81)  Bisoprolol (Ziac)  Fluticasone (Advair)  Gabapentin  Hydrochlorothiazide (Ziac)  Ipratropium (Ipratropium-Albuterol)  Levothyroxine  Oxygen  Potassium (K-Dur)  Ranitidine  Roflumilast  Salmeterol (Advair)  Sertraline  Torsemide ==================================================================== For clinical consultation, please call [phone]. ====================================================================    Laboratory Chemistry Profile (12 mo)  Renal: 11/16/2018: BUN/Creatinine Ratio 24 07/14/2019: BUN 31; Creatinine, Ser 0.75  Lab Results  Component Value Date   GFRAA >60 07/14/2019   GFRNONAA >60 07/14/2019   Hepatic: 11/16/2018: Albumin 4.3 Lab Results  Component Value Date   AST 20 11/16/2018   ALT 20 11/16/2018   Other: No results found for requested labs within last 8760 hours. Note: Above Lab results reviewed.  Imaging  Last 90 days:  Dg Chest 1 View  Result Date: 07/06/2019 CLINICAL DATA:  Shortness of breath, chest pain EXAM: CHEST  1 VIEW COMPARISON:  02/07/2018 FINDINGS: Cardiomegaly. Mild, diffuse bilateral interstitial pulmonary opacity. The visualized skeletal structures are unremarkable. IMPRESSION: Cardiomegaly with mild, diffuse bilateral interstitial pulmonary opacity, likely mild  edema. No focal airspace opacity. Electronically Signed   By: Eddie Candle M.D.   On: 07/06/2019 15:15   Dg Lumbar Spine 2-3 Views  Result Date: 07/16/2019 CLINICAL DATA:  63 year old female with back pain. EXAM: LUMBAR SPINE - 2-3 VIEW; DG HIP (WITH OR WITHOUT PELVIS) 2-3V LEFT COMPARISON:  Left hip radiograph dated 12/29/2015 FINDINGS: There is no acute fracture or subluxation the lumbar spine. Multilevel degenerative changes with disc space narrowing and endplate irregularity most prominent involving the lower lumbar at L3-L4, L4-L5 and L5-S1. Lower lumbar facet arthropathy with probable associated narrowing of the neural foramina at L5-S1. The visualized posterior elements are intact. There is no acute fracture or subluxation of the hip or pelvis. Mild bilateral hip arthritic changes. The soft tissues are unremarkable. IMPRESSION: 1. No acute fracture or subluxation of the lumbar spine or pelvis/hips. 2. Multilevel degenerative changes of the spine and mild bilateral hip arthritic changes. Electronically Signed   By: Anner Crete M.D.   On: 07/16/2019 18:47   Dg Ankle Complete Right  Result Date: 07/23/2019 CLINICAL DATA:  Fall. Ankle pain and swelling.  Initial encounter. EXAM: RIGHT ANKLE - COMPLETE 3+ VIEW COMPARISON:  None. FINDINGS: Mild diffuse soft tissue swelling is noted. No evidence  of ankle joint effusion. No evidence of acute fracture or dislocation. Prominent plantar calcaneal bone spur noted. IMPRESSION: Mild diffuse soft tissue swelling. No evidence of acute fracture or dislocation. Electronically Signed   By: Marlaine Hind M.D.   On: 07/23/2019 17:47   Dg Abd 1 View  Result Date: 07/13/2019 CLINICAL DATA:  Constipation. EXAM: ABDOMEN - 1 VIEW COMPARISON:  None. FINDINGS: No gaseous small bowel dilatation to suggest obstruction. Large stool volume evident. Phleboliths overlie the inferior pelvis bilaterally. No suspicious lytic or sclerotic osseous abnormality. IMPRESSION: Large  stool volume compatible with the reported clinical history of constipation. Electronically Signed   By: Misty Stanley M.D.   On: 07/13/2019 09:49   Ct Angio Chest Pe W Or Wo Contrast  Result Date: 07/07/2019 CLINICAL DATA:  Chest pain and worsening shortness of breath for 2 days. History of wheezing, hypertension, COPD, CHF, smoker. EXAM: CT ANGIOGRAPHY CHEST WITH CONTRAST TECHNIQUE: Multidetector CT imaging of the chest was performed using the standard protocol during bolus administration of intravenous contrast. Multiplanar CT image reconstructions and MIPs were obtained to evaluate the vascular anatomy. CONTRAST:  54m OMNIPAQUE IOHEXOL 350 MG/ML SOLN COMPARISON:  Chest CT dated 03/25/2019. FINDINGS: Cardiovascular: Some of the peripheral segmental and subsegmental pulmonary artery branches are difficult to definitively characterize due to patient breathing motion artifact, however, there is no pulmonary embolism identified within the main, lobar or segmental pulmonary arteries bilaterally. No thoracic aortic aneurysm or evidence of aortic dissection. Mild aortic atherosclerosis. Cardiomegaly. No pericardial effusion. Diffuse coronary artery calcifications, particularly dense within the LEFT anterior descending coronary artery. Mediastinum/Nodes: No mass or enlarged lymph nodes seen within the mediastinum or perihilar regions. Esophagus appears normal. Trachea appears normal. Lungs/Pleura: Mild bibasilar atelectasis/scarring. Mild centrilobular and paraseptal emphysema. Stable linear consolidations within the medial aspects of each lung suggesting chronic MAI. No evidence of acute pneumonia or pulmonary edema. Upper Abdomen: No acute findings on limited images of the upper abdomen. Previously described cysts within the liver, again too small to definitively characterize. Musculoskeletal: No acute or suspicious osseous finding. Mild degenerative spurring within the thoracic spine. Review of the MIP images  confirms the above findings. IMPRESSION: 1. No pulmonary embolism seen, with mild study limitations detailed above. 2. Cardiomegaly. No pericardial effusion. 3. Diffuse coronary artery calcifications, particularly dense within the LEFT anterior descending coronary artery. Recommend correlation with any possible associated cardiac symptoms. 4. Chronic/incidental lung findings detailed above. No evidence of active pneumonia or pulmonary edema. Aortic Atherosclerosis (ICD10-I70.0) and Emphysema (ICD10-J43.9). Electronically Signed   By: SFranki CabotM.D.   On: 07/07/2019 11:20   Mr Thoracic Spine W Wo Contrast  Result Date: 07/23/2019 CLINICAL DATA:  Back pain. Recent fall EXAM: MRI THORACIC WITHOUT AND WITH CONTRAST TECHNIQUE: Multiplanar and multiecho pulse sequences of the thoracic spine were obtained without and with intravenous contrast. CONTRAST:  112mGADAVIST GADOBUTROL 1 MMOL/ML IV SOLN COMPARISON:  09/30/2010 FINDINGS: MRI THORACIC SPINE FINDINGS Alignment:  Physiologic. Vertebrae: T12 meningioma. No fracture Cord:  Normal signal and morphology. Paraspinal and other soft tissues: Negative. Disc levels: Small right subarticular disc protrusion at T7-T8 without associated stenosis. There is otherwise mild degenerative disc disease but no neural foraminal or spinal canal stenosis. IMPRESSION: 1. No acute abnormality of the thoracic spine. 2. Mild thoracic degenerative disc disease without spinal canal or neural foraminal stenosis. Electronically Signed   By: KeUlyses Jarred.D.   On: 07/23/2019 21:52   Mr Lumbar Spine W Wo Contrast  Result Date: 07/23/2019 CLINICAL  DATA:  Worsening back pain and multiple falls EXAM: MRI LUMBAR SPINE WITHOUT AND WITH CONTRAST TECHNIQUE: Multiplanar and multiecho pulse sequences of the lumbar spine were obtained without and with intravenous contrast. CONTRAST:  99m GADAVIST GADOBUTROL 1 MMOL/ML IV SOLN COMPARISON:  None. FINDINGS: Segmentation:  Standard Alignment:   Normal Vertebrae:  T12 hemangioma. No fracture or discitis-osteomyelitis. Conus medullaris and cauda equina: Conus extends to the L1 level. Conus and cauda equina appear normal. Paraspinal and other soft tissues: Negative Disc levels: There is no abnormal contrast enhancement of the lumbar spine. T12-L1: Normal disc space and facets. No spinal canal or neuroforaminal stenosis. L1-L2: Normal disc space and facets. No spinal canal or neuroforaminal stenosis. L2-L3: There is a large left subarticular disc extrusion that extends inferiorly into the left L3-4 neural foramen. There is mass effect on the descending left-sided nerve roots. No central spinal canal or neural foraminal stenosis at this level. L3-L4: The disc extrusion extending inferiorly from the above level contacts the exiting left L3 nerve root. There is also mild disc bulge at this level with mild facet hypertrophy. No central spinal canal or right neural foraminal stenosis. L4-L5: Disc space narrowing with mild diffuse bulge. No spinal canal stenosis. Moderate bilateral neural foraminal stenosis. L5-S1: Small disc bulge. Mild bilateral foraminal stenosis. No spinal canal stenosis. Visualized sacrum: Normal. IMPRESSION: 1. Large left subarticular disc extrusion at L2-L3 that extends inferiorly into the left L3-4 neural foramen and displaces the descending left-sided nerve root. This could contribute to radicular symptoms in the left L3 or L4 distributions. 2. Moderate bilateral L4-L5 neural foraminal stenosis. 3. No spinal canal stenosis. Electronically Signed   By: KUlyses JarredM.D.   On: 07/23/2019 21:48   UKoreaVenous Img Lower  Left (dvt Study)  Result Date: 07/14/2019 CLINICAL DATA:  Left lower extremity pain. EXAM: Left LOWER EXTREMITY VENOUS DOPPLER ULTRASOUND TECHNIQUE: Gray-scale sonography with graded compression, as well as color Doppler and duplex ultrasound were performed to evaluate the lower extremity deep venous systems from the level of  the common femoral vein and including the common femoral, femoral, profunda femoral, popliteal and calf veins including the posterior tibial, peroneal and gastrocnemius veins when visible. The superficial great saphenous vein was also interrogated. Spectral Doppler was utilized to evaluate flow at rest and with distal augmentation maneuvers in the common femoral, femoral and popliteal veins. COMPARISON:  None. FINDINGS: Contralateral Common Femoral Vein: Respiratory phasicity is normal and symmetric with the symptomatic side. No evidence of thrombus. Normal compressibility. Common Femoral Vein: No evidence of thrombus. Normal compressibility, respiratory phasicity and response to augmentation. Saphenofemoral Junction: No evidence of thrombus. Normal compressibility and flow on color Doppler imaging. Profunda Femoral Vein: No evidence of thrombus. Normal compressibility and flow on color Doppler imaging. Femoral Vein: No evidence of thrombus. Normal compressibility, respiratory phasicity and response to augmentation. Popliteal Vein: No evidence of thrombus. Normal compressibility, respiratory phasicity and response to augmentation. Calf Veins: No evidence of thrombus. Normal compressibility and flow on color Doppler imaging. Venous Reflux:  None. Other Findings:  None. IMPRESSION: No evidence of deep venous thrombosis. Electronically Signed   By: JMarijo ConceptionM.D.   On: 07/14/2019 13:20   Dg Knee Complete 4 Views Left  Result Date: 07/16/2019 CLINICAL DATA:  Left knee pain. EXAM: LEFT KNEE - COMPLETE 4+ VIEW COMPARISON:  None. FINDINGS: No evidence of fracture, dislocation, or joint effusion. Curvilinear osseous density about the medial femoral metaphysis may represent prior MCL injury. No evidence of  arthropathy or other focal bone abnormality. There are vascular calcifications. Soft tissues are unremarkable. IMPRESSION: 1. No acute abnormality. 2. Peripheral vascular calcifications. Electronically Signed    By: Keith Rake M.D.   On: 07/16/2019 20:01   Dg Foot Complete Right  Result Date: 07/23/2019 CLINICAL DATA:  Fall. Right foot injury and pain. Initial encounter. EXAM: RIGHT FOOT COMPLETE - 3+ VIEW COMPARISON:  None. FINDINGS: There is no evidence of fracture or dislocation. There is no evidence of arthropathy. Prominent plantar calcaneal bone spur noted. Soft tissue swelling is seen dorsal and lateral aspect of the foot. IMPRESSION: Dorsal and lateral soft tissue swelling. No evidence of fracture or dislocation. Electronically Signed   By: Marlaine Hind M.D.   On: 07/23/2019 17:46   Dg Pain Clinic C-arm 1-60 Min No Report  Result Date: 07/30/2019 Fluoro was used, but no Radiologist interpretation will be provided. Please refer to "NOTES" tab for provider progress note.  Dg Hip Unilat W Or Wo Pelvis 2-3 Views Left  Result Date: 07/16/2019 CLINICAL DATA:  63 year old female with back pain. EXAM: LUMBAR SPINE - 2-3 VIEW; DG HIP (WITH OR WITHOUT PELVIS) 2-3V LEFT COMPARISON:  Left hip radiograph dated 12/29/2015 FINDINGS: There is no acute fracture or subluxation the lumbar spine. Multilevel degenerative changes with disc space narrowing and endplate irregularity most prominent involving the lower lumbar at L3-L4, L4-L5 and L5-S1. Lower lumbar facet arthropathy with probable associated narrowing of the neural foramina at L5-S1. The visualized posterior elements are intact. There is no acute fracture or subluxation of the hip or pelvis. Mild bilateral hip arthritic changes. The soft tissues are unremarkable. IMPRESSION: 1. No acute fracture or subluxation of the lumbar spine or pelvis/hips. 2. Multilevel degenerative changes of the spine and mild bilateral hip arthritic changes. Electronically Signed   By: Anner Crete M.D.   On: 07/16/2019 18:47    Assessment  The primary encounter diagnosis was Chronic pain syndrome (significant psychosocial component). Diagnoses of Chronic low back pain  (Primary Area of Pain) (Bilateral) (L>R), Chronic lower extremity pain (Secondary Area of Pain) (Left), Chronic hip pain (Tertiary Area of Pain) (Bilateral) (L>R), Neurogenic pain, Neuropathic pain, Lumbar facet syndrome (Bilateral) (L>R), Left thigh pain, Chronic knee pain (Left), Left knee pain, unspecified chronicity, Chronic lumbar radiculopathy/radiculitis (L2) (Left), and Abnormal MRI, lumbar spine (07/23/2019) were also pertinent to this visit.  Plan of Care  I have discontinued Theresa [name]'s predniSONE. I have also changed her baclofen. Additionally, I am having her start on HYDROcodone-acetaminophen and HYDROcodone-acetaminophen. Lastly, I am having her maintain her Cholecalciferol, ALPRAZolam, Ventolin HFA, ibuprofen, potassium chloride SA, Fluticasone-Umeclidin-Vilant, sertraline, ipratropium-albuterol, bisoprolol-hydrochlorothiazide, levothyroxine, aspirin EC, digoxin, torsemide, lidocaine, gabapentin, and HYDROcodone-acetaminophen.  Pharmacotherapy (Medications Ordered): Meds ordered this encounter  Medications  . gabapentin (NEURONTIN) 300 MG capsule    Sig: Take 1 capsule (300 mg total) by mouth 4 (four) times daily.    Dispense:  120 capsule    Refill:  5    Fill one day early if pharmacy is closed on scheduled refill date. May substitute for generic if available.  Marland Kitchen HYDROcodone-acetaminophen (NORCO/VICODIN) 5-325 MG tablet    Sig: Take 1 tablet by mouth every 8 (eight) hours as needed for severe pain. Must last 30 days    Dispense:  90 tablet    Refill:  0    Chronic Pain: STOP Act (Not applicable) Fill 1 day early if closed on refill date. Do not fill until: 08/16/2019. To last until: 09/15/2019. Avoid benzodiazepines within 8  hours of opioids  . HYDROcodone-acetaminophen (NORCO/VICODIN) 5-325 MG tablet    Sig: Take 1 tablet by mouth every 8 (eight) hours as needed for severe pain. Must last 30 days    Dispense:  90 tablet    Refill:  0    Chronic Pain: STOP Act (Not  applicable) Fill 1 day early if closed on refill date. Do not fill until: 09/15/2019. To last until: 10/15/2019. Avoid benzodiazepines within 8 hours of opioids  . HYDROcodone-acetaminophen (NORCO/VICODIN) 5-325 MG tablet    Sig: Take 1 tablet by mouth every 8 (eight) hours as needed for severe pain. Must last 30 days    Dispense:  90 tablet    Refill:  0    Chronic Pain: STOP Act (Not applicable) Fill 1 day early if closed on refill date. Do not fill until: 10/15/2019. To last until: 11/14/2019. Avoid benzodiazepines within 8 hours of opioids  . baclofen (LIORESAL) 10 MG tablet    Sig: Take 1 tablet (10 mg total) by mouth 3 (three) times daily.    Dispense:  90 tablet    Refill:  5    Fill one day early if pharmacy is closed on scheduled refill date. May substitute for generic if available.   Orders:  Orders Placed This Encounter  Procedures  . LUMBAR FACET(MEDIAL BRANCH NERVE BLOCK) MBNB    Scheduling timeframe: (PRN procedure) Theresa [name] will call when needed. Clinical indication: Axial low back pain. 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  Sedation: Usually done with sedation. (May be done without sedation if so desired by patient.) Requirements: NPO x 8 hrs.; Driver; Stop blood thinners. Interval: No sooner than two weeks for diagnostic or therapeutic. No sooner than every other month for palliative.    Standing Status:   Standing    Number of Occurrences:   5    Standing Expiration Date:   08/13/2020    Scheduling Instructions:     Procedure: Lumbar facet block (AKA.: Lumbosacral medial branch nerve block)     Level: L3-4, L4-5, & L5-S1 Facets (L2, L3, L4, L5, & S1 Medial Branch Nerves)     Laterality: Bilateral    Order Specific Question:   Where will this procedure be performed?    Answer:   [facility]  . Lumbar Epidural Injection    Standing Status:   Future    Standing Expiration Date:   09/14/2019    Scheduling Instructions:     Procedure: Interlaminar Lumbar  Epidural Steroid injection (LESI)  L2-3     Laterality: Left-sided     Sedation: None required.     Timeframe: ASAA    Order Specific Question:   Where will this procedure be performed?    Answer:   [facility]  . MR KNEE LEFT WO CONTRAST    Standing Status:   Future    Standing Expiration Date:   10/13/2020    Order Specific Question:   What is the patient's sedation requirement?    Answer:   No Sedation    Order Specific Question:   Does the patient have a pacemaker or implanted devices?    Answer:   No    Order Specific Question:   Preferred imaging location?    Answer:   [facility] (table limit-400lbs)    Order Specific Question:   Call Results- Best Contact Number?    Answer:   [phone]    Order Specific Question:   Radiology Contrast Protocol - do NOT remove file path  Answer:   [email or web address]   Follow-up plan:   Return in about 13 weeks (around 11/13/2019) for (VV), (MM), in addition, Procedure (no sedation): (L) L2-3 LESI #1 vs (L) L2 & L3 TFESI #1.      Interventional management options: Planned, scheduled, and/or pending: NOTE: NO further Lumbar RFAuntil BMI <35.    Considering: Diagnostic left L2 TFESI #1  Diagnostic left L3 TFESI #1 Diagnostic left L2-3 LESI #1  Palliative procedures    Palliative PRN treatment(s): Palliativebilaterallumbar facetblock #4 Diagnostic/palliative left L4TFESI #2 Diagnostic/palliative left L4-5 LESI #3 Diagnostic/palliativerightL4 TFESI #2 Diagnostic/palliativerightL4-5 LESI #2 Palliative leftlumbar facet RFA#2(done on 06/27/2017) Palliative right lumbar facet RFA#2(done on 10/03/2017)    Recent Visits Date Type Provider Dept  07/30/19 Procedure visit Milinda Pointer, MD [facility]  07/23/19 Office Visit Milinda Pointer, MD [facility]  Showing recent visits within past 90 days and meeting all other requirements   Today's  Visits Date Type Provider Dept  08/14/19 Office Visit Milinda Pointer, MD [facility]  Showing today's visits and meeting all other requirements   Future Appointments No visits were found meeting these conditions.  Showing future appointments within next 90 days and meeting all other requirements   I discussed the assessment and treatment plan with the patient. The patient was provided an opportunity to ask questions and all were answered. The patient agreed with the plan and demonstrated an understanding of the instructions.  Patient advised to call back or seek an in-person evaluation if the symptoms or condition worsens.  Total duration of non-face-to-face encounter: 20 minutes.  Note by: Gaspar Cola, MD Date: 08/14/2019; Time: 10:06 AM  Note: This dictation was prepared with Dragon dictation. Any transcriptional errors that may result from this process are unintentional.  Disclaimer:  * Given the special circumstances of the COVID-19 pandemic, the federal government has announced that the Office for Civil Rights (OCR) will exercise its enforcement discretion and will not impose penalties on physicians using telehealth in the event of noncompliance with regulatory requirements under the [place] (HIPAA) in connection with the good faith provision of telehealth during the VJDYN-18 national public health emergency. ([place])

## 2019-08-16 ENCOUNTER — Other Ambulatory Visit: Payer: Self-pay | Admitting: Family Medicine

## 2019-08-16 MED ORDER — DIGOXIN 250 MCG PO TABS: 0.2500 mg | ORAL_TABLET | Freq: Every day | ORAL | 0 refills | Status: DC

## 2019-08-16 MED ORDER — LEVOTHYROXINE SODIUM 25 MCG PO TABS: 25.0000 ug | ORAL_TABLET | Freq: Every day | ORAL | 3 refills | Status: DC

## 2019-08-16 NOTE — Telephone Encounter (Signed)
Pt needs refills on  Levothyroxine 25 mcg  Digoxin 0.25  [facility] Garfinkel

## 2019-08-19 ENCOUNTER — Ambulatory Visit: Payer: Medicare HMO | Admitting: Family

## 2019-08-21 DIAGNOSIS — M5137 Other intervertebral disc degeneration, lumbosacral region: Secondary | ICD-10-CM | POA: Diagnosis not present

## 2019-08-21 DIAGNOSIS — E039 Hypothyroidism, unspecified: Secondary | ICD-10-CM | POA: Diagnosis not present

## 2019-08-21 DIAGNOSIS — J441 Chronic obstructive pulmonary disease with (acute) exacerbation: Secondary | ICD-10-CM | POA: Diagnosis not present

## 2019-08-21 DIAGNOSIS — F419 Anxiety disorder, unspecified: Secondary | ICD-10-CM | POA: Diagnosis not present

## 2019-08-21 DIAGNOSIS — J9611 Chronic respiratory failure with hypoxia: Secondary | ICD-10-CM | POA: Diagnosis not present

## 2019-08-21 DIAGNOSIS — M5030 Other cervical disc degeneration, unspecified cervical region: Secondary | ICD-10-CM | POA: Diagnosis not present

## 2019-08-21 DIAGNOSIS — G8929 Other chronic pain: Secondary | ICD-10-CM | POA: Diagnosis not present

## 2019-08-21 DIAGNOSIS — I5032 Chronic diastolic (congestive) heart failure: Secondary | ICD-10-CM | POA: Diagnosis not present

## 2019-08-21 DIAGNOSIS — I110 Hypertensive heart disease with heart failure: Secondary | ICD-10-CM | POA: Diagnosis not present

## 2019-08-23 DIAGNOSIS — J9610 Chronic respiratory failure, unspecified whether with hypoxia or hypercapnia: Secondary | ICD-10-CM | POA: Diagnosis not present

## 2019-08-26 ENCOUNTER — Ambulatory Visit
Admission: RE | Admit: 2019-08-26 | Discharge: 2019-08-26 | Disposition: A | Discharge status: Home / Self Care | Payer: Medicare HMO | Source: Ambulatory Visit | Attending: Pain Medicine | Admitting: Pain Medicine

## 2019-08-26 ENCOUNTER — Other Ambulatory Visit: Payer: Self-pay

## 2019-08-26 ENCOUNTER — Ambulatory Visit: Admission: RE | Admit: 2019-08-26 | Payer: Medicare HMO | Source: Ambulatory Visit

## 2019-08-26 DIAGNOSIS — M25562 Pain in left knee: Secondary | ICD-10-CM | POA: Diagnosis not present

## 2019-08-27 ENCOUNTER — Ambulatory Visit: Payer: Medicare HMO | Admitting: Pain Medicine

## 2019-08-27 NOTE — Progress Notes (Deleted)
Patient's Name: Theresa [name]  MRN: [id number]  Referring Provider: Margo Common, [place]  DOB: 02-21-1956  PCP: Margo Common, PA  DOS: 08/27/2019  Note by: Gaspar Cola, MD  Service setting: Ambulatory outpatient  Specialty: Interventional Pain Management  Patient type: Established  Location: [facility] ([facility]  Visit type: Interventional Procedure   Primary Reason for Visit: Interventional Pain Management Treatment. CC: No chief complaint on file.  Procedure:          Anesthesia, Analgesia, Anxiolysis:  Type: Diagnostic Inter-Laminar Epidural Steroid Injection  #1  Region: Lumbar Level: L2-3 Level. Laterality: Left-Sided Paramedial  Type: Local Anesthesia Indication(s): Analgesia         Route: Infiltration ([place]/IM) IV Access: Declined Sedation: Declined  Local Anesthetic: Lidocaine 1-2%  Position: Prone with head of the table was raised to facilitate breathing.   Indications: 1. DDD (degenerative disc disease), lumbar   2. Chronic lumbar radiculopathy/radiculitis (L2) (Left)   3. Chronic lower extremity pain (Secondary Area of Pain) (Left)    Pain Score: Pre-procedure:  /10 Post-procedure:  /10   Pre-op Assessment:  Theresa [name] is a 63 y.o. (year old), female patient, seen today for interventional treatment. She  has a past surgical history that includes Abdominal hysterectomy; Tonsillectomy; Carpal tunnel release (Left, 2012); Tubal ligation; epigastric hernia repair (N/A, 08/13/2018); Umbilical hernia repair (N/A, 08/13/2018); and RIGHT/LEFT HEART CATH AND CORONARY ANGIOGRAPHY (N/A, 07/11/2019). [name] has a current medication list which includes the following prescription(s): alprazolam, aspirin ec, baclofen, bisoprolol-hydrochlorothiazide, cholecalciferol, digoxin, fluticasone-umeclidin-vilant, gabapentin, hydrocodone-acetaminophen, hydrocodone-acetaminophen, hydrocodone-acetaminophen, ibuprofen, ipratropium-albuterol, levothyroxine, lidocaine,  potassium chloride sa, sertraline, torsemide, and ventolin hfa. Her primarily concern today is the No chief complaint on file.  Initial Vital Signs:  Pulse/HCG Rate:    Temp:   Resp:   BP:   SpO2:    BMI: Estimated body mass index is 44.26 kg/m as calculated from the following:   Height as of 07/30/19: _0  (1.575 m).   Weight as of 07/30/19: 242 lb (109.8 kg).  Risk Assessment: Allergies: Reviewed. She is allergic to codeine and meloxicam.  Allergy Precautions: None required Coagulopathies: Reviewed. None identified.  Blood-thinner therapy: None at this time Active Infection(s): Reviewed. None identified. Theresa [name] is afebrile  Site Confirmation: Theresa [name] was asked to confirm the procedure and laterality before marking the site Procedure checklist: Completed Consent: Before the procedure and under the influence of no sedative(s), amnesic(s), or anxiolytics, the patient was informed of the treatment options, risks and possible complications. To fulfill our ethical and legal obligations, as recommended by the American Medical Association's Code of Ethics, I have informed the patient of my clinical impression; the nature and purpose of the treatment or procedure; the risks, benefits, and possible complications of the intervention; the alternatives, including doing nothing; the risk(s) and benefit(s) of the alternative treatment(s) or procedure(s); and the risk(s) and benefit(s) of doing nothing. The patient was provided information about the general risks and possible complications associated with the procedure. These may include, but are not limited to: failure to achieve desired goals, infection, bleeding, organ or nerve damage, allergic reactions, paralysis, and death. In addition, the patient was informed of those risks and complications associated to Spine-related procedures, such as failure to decrease pain; infection (i.e.: Meningitis, epidural or intraspinal abscess); bleeding  (i.e.: epidural hematoma, subarachnoid hemorrhage, or any other type of intraspinal or peri-dural bleeding); organ or nerve damage (i.e.: Any type of peripheral nerve, nerve root, or spinal cord injury) with  subsequent damage to sensory, motor, and/or autonomic systems, resulting in permanent pain, numbness, and/or weakness of one or several areas of the body; allergic reactions; (i.e.: anaphylactic reaction); and/or death. Furthermore, the patient was informed of those risks and complications associated with the medications. These include, but are not limited to: allergic reactions (i.e.: anaphylactic or anaphylactoid reaction(s)); adrenal axis suppression; blood sugar elevation that in diabetics may result in ketoacidosis or comma; water retention that in patients with history of congestive heart failure may result in shortness of breath, pulmonary edema, and decompensation with resultant heart failure; weight gain; swelling or edema; medication-induced neural toxicity; particulate matter embolism and blood vessel occlusion with resultant organ, and/or nervous system infarction; and/or aseptic necrosis of one or more joints. Finally, the patient was informed that Medicine is not an exact science; therefore, there is also the possibility of unforeseen or unpredictable risks and/or possible complications that may result in a catastrophic outcome. The patient indicated having understood very clearly. We have given the patient no guarantees and we have made no promises. Enough time was given to the patient to ask questions, all of which were answered to the patient's satisfaction. [name] has indicated that she wanted to continue with the procedure. Attestation: I, the ordering provider, attest that I have discussed with the patient the benefits, risks, side-effects, alternatives, likelihood of achieving goals, and potential problems during recovery for the procedure that I have provided informed consent. Date   Time: {CHL [facility] TIME CHOICES:[id number]}  Pre-Procedure Preparation:  Monitoring: As per clinic protocol. Respiration, ETCO2, SpO2, BP, heart rate and rhythm monitor placed and checked for adequate function Safety Precautions: Patient was assessed for positional comfort and pressure points before starting the procedure. Time-out: I initiated and conducted the "Time-out" before starting the procedure, as per protocol. The patient was asked to participate by confirming the accuracy of the "Time Out" information. Verification of the correct person, site, and procedure were performed and confirmed by me, the nursing staff, and the patient. "Time-out" conducted as per Joint Commission's Universal Protocol (UP.01.01.01). Time:    Description of Procedure:          Target Area: The interlaminar space, initially targeting the lower laminar border of the superior vertebral body. Approach: Paramedial approach. Area Prepped: Entire Posterior Lumbar Region Prepping solution: DuraPrep (Iodine Povacrylex [0.7% available iodine] and Isopropyl Alcohol, 74% w/w) Safety Precautions: Aspiration looking for blood return was conducted prior to all injections. At no point did we inject any substances, as a needle was being advanced. No attempts were made at seeking any paresthesias. Safe injection practices and needle disposal techniques used. Medications properly checked for expiration dates. SDV (single dose vial) medications used. Description of the Procedure: Protocol guidelines were followed. The procedure needle was introduced through the skin, ipsilateral to the reported pain, and advanced to the target area. Bone was contacted and the needle walked caudad, until the lamina was cleared. The epidural space was identified using "loss-of-resistance technique" with 2-3 ml of PF-NaCl (0.9% NSS), in a 5cc LOR glass syringe.  There were no vitals filed for this visit.  Start Time:   hrs. End Time:    hrs.  Materials:  Needle(s) Type: Epidural needle Gauge: 17G Length: 3.5-in Medication(s): Please see orders for medications and dosing details.  Imaging Guidance (Spinal):          Type of Imaging Technique: Fluoroscopy Guidance (Spinal) Indication(s): Assistance in needle guidance and placement for procedures requiring needle placement in or near specific  anatomical locations not easily accessible without such assistance. Exposure Time: Please see nurses notes. Contrast: Before injecting any contrast, we confirmed that the patient did not have an allergy to iodine, shellfish, or radiological contrast. Once satisfactory needle placement was completed at the desired level, radiological contrast was injected. Contrast injected under live fluoroscopy. No contrast complications. See chart for type and volume of contrast used. Fluoroscopic Guidance: I was personally present during the use of fluoroscopy. "Tunnel Vision Technique" used to obtain the best possible view of the target area. Parallax error corrected before commencing the procedure. "Direction-depth-direction" technique used to introduce the needle under continuous pulsed fluoroscopy. Once target was reached, antero-posterior, oblique, and lateral fluoroscopic projection used confirm needle placement in all planes. Images permanently stored in EMR. Interpretation: I personally interpreted the imaging intraoperatively. Adequate needle placement confirmed in multiple planes. Appropriate spread of contrast into desired area was observed. No evidence of afferent or efferent intravascular uptake. No intrathecal or subarachnoid spread observed. Permanent images saved into the patient's record.  Antibiotic Prophylaxis:   Anti-infectives (From admission, onward)   None     Indication(s): None identified  Post-operative Assessment:  Post-procedure Vital Signs:  Pulse/HCG Rate:    Temp:   Resp:   BP:   SpO2:    EBL:  None  Complications: No immediate post-treatment complications observed by team, or reported by patient.  Note: The patient tolerated the entire procedure well. A repeat set of vitals were taken after the procedure and the patient was kept under observation following institutional policy, for this type of procedure. Post-procedural neurological assessment was performed, showing return to baseline, prior to discharge. The patient was provided with post-procedure discharge instructions, including a section on how to identify potential problems. Should any problems arise concerning this procedure, the patient was given instructions to immediately contact us, at any time, without hesitation. In any case, we plan to contact the patient by telephone for a follow-up status report regarding this interventional procedure.  Comments:  No additional relevant information.  Plan of Care  Orders:  No orders of the defined types were placed in this encounter.  Chronic Opioid Analgesic:  Hydrocodone/APAP 5/325 one tablet every 8 hours (15 mg/day of hydrocodone) MME/day:15 mg/day.   Medications ordered for procedure: No orders of the defined types were placed in this encounter.  Medications administered: [name] had no medications administered during this visit.  See the medical record for exact dosing, route, and time of administration.  Follow-up plan:   No follow-ups on file.       Interventional management options: Planned, scheduled, and/or pending: NOTE: NO further Lumbar RFAuntil BMI <35.    Considering: Palliative procedures    Palliative PRN treatment(s): Palliativebilaterallumbar facetblock #4 Diagnostic/palliative left L4TFESI #2 Diagnostic/palliative left L4-5 LESI #3 Diagnostic/palliativerightL4 TFESI #2 Diagnostic/palliativerightL4-5 LESI #2 Palliative leftlumbar facet RFA#2(done on 06/27/2017) Palliative right lumbar facet RFA#2(done on  10/03/2017)     Recent Visits Date Type Provider Dept  08/14/19 Office Visit Milinda Pointer, MD [facility]  07/30/19 Procedure visit Milinda Pointer, MD [facility]  07/23/19 Office Visit Milinda Pointer, MD [facility]  Showing recent visits within past 90 days and meeting all other requirements   Today's Visits Date Type Provider Dept  08/27/19 Appointment Milinda Pointer, MD [facility]  Showing today's visits and meeting all other requirements   Future Appointments Date Type Provider Dept  11/13/19 Appointment Milinda Pointer, MD [facility]  Showing future appointments within next  90 days and meeting all other requirements   Disposition: Discharge home  Discharge Date & Time: 08/27/2019;   hrs.   Primary Care Physician: Margo Common, PA Location: [facility] Note by: Gaspar Cola, MD Date: 08/27/2019; Time: 7:51 AM  Disclaimer:  Medicine is not an Chief Strategy Officer. The only guarantee in medicine is that nothing is guaranteed. It is important to note that the decision to proceed with this intervention was based on the information collected from the patient. The Data and conclusions were drawn from the patient's questionnaire, the interview, and the physical examination. Because the information was provided in large part by the patient, it cannot be guaranteed that it has not been purposely or unconsciously manipulated. Every effort has been made to obtain as much relevant data as possible for this evaluation. It is important to note that the conclusions that lead to this procedure are derived in large part from the available data. Always take into account that the treatment will also be dependent on availability of resources and existing treatment guidelines, considered by other Pain Management Practitioners as being common knowledge and practice, at the time of the intervention.  For Medico-Legal purposes, it is also important to point out that variation in procedural techniques and pharmacological choices are the acceptable norm. The indications, contraindications, technique, and results of the above procedure should only be interpreted and judged by a Board-Certified Interventional Pain Specialist with extensive familiarity and expertise in the same exact procedure and technique.

## 2019-08-30 DIAGNOSIS — J441 Chronic obstructive pulmonary disease with (acute) exacerbation: Secondary | ICD-10-CM | POA: Diagnosis not present

## 2019-08-30 DIAGNOSIS — I110 Hypertensive heart disease with heart failure: Secondary | ICD-10-CM | POA: Diagnosis not present

## 2019-08-30 DIAGNOSIS — F419 Anxiety disorder, unspecified: Secondary | ICD-10-CM | POA: Diagnosis not present

## 2019-08-30 DIAGNOSIS — M5137 Other intervertebral disc degeneration, lumbosacral region: Secondary | ICD-10-CM | POA: Diagnosis not present

## 2019-08-30 DIAGNOSIS — M5030 Other cervical disc degeneration, unspecified cervical region: Secondary | ICD-10-CM | POA: Diagnosis not present

## 2019-08-30 DIAGNOSIS — E039 Hypothyroidism, unspecified: Secondary | ICD-10-CM | POA: Diagnosis not present

## 2019-08-30 DIAGNOSIS — I5032 Chronic diastolic (congestive) heart failure: Secondary | ICD-10-CM | POA: Diagnosis not present

## 2019-08-30 DIAGNOSIS — G8929 Other chronic pain: Secondary | ICD-10-CM | POA: Diagnosis not present

## 2019-08-30 DIAGNOSIS — J9611 Chronic respiratory failure with hypoxia: Secondary | ICD-10-CM | POA: Diagnosis not present

## 2019-09-03 DIAGNOSIS — F419 Anxiety disorder, unspecified: Secondary | ICD-10-CM | POA: Diagnosis not present

## 2019-09-03 DIAGNOSIS — J441 Chronic obstructive pulmonary disease with (acute) exacerbation: Secondary | ICD-10-CM | POA: Diagnosis not present

## 2019-09-03 DIAGNOSIS — J9611 Chronic respiratory failure with hypoxia: Secondary | ICD-10-CM | POA: Diagnosis not present

## 2019-09-03 DIAGNOSIS — M5030 Other cervical disc degeneration, unspecified cervical region: Secondary | ICD-10-CM | POA: Diagnosis not present

## 2019-09-03 DIAGNOSIS — I110 Hypertensive heart disease with heart failure: Secondary | ICD-10-CM | POA: Diagnosis not present

## 2019-09-03 DIAGNOSIS — M5137 Other intervertebral disc degeneration, lumbosacral region: Secondary | ICD-10-CM | POA: Diagnosis not present

## 2019-09-03 DIAGNOSIS — I5032 Chronic diastolic (congestive) heart failure: Secondary | ICD-10-CM | POA: Diagnosis not present

## 2019-09-03 DIAGNOSIS — E039 Hypothyroidism, unspecified: Secondary | ICD-10-CM | POA: Diagnosis not present

## 2019-09-03 DIAGNOSIS — G8929 Other chronic pain: Secondary | ICD-10-CM | POA: Diagnosis not present

## 2019-09-10 ENCOUNTER — Ambulatory Visit
Admission: RE | Admit: 2019-09-10 | Discharge: 2019-09-10 | Disposition: A | Discharge status: Home / Self Care | Payer: Medicare HMO | Source: Ambulatory Visit | Attending: Pain Medicine | Admitting: Pain Medicine

## 2019-09-10 ENCOUNTER — Ambulatory Visit (HOSPITAL_BASED_OUTPATIENT_CLINIC_OR_DEPARTMENT_OTHER): Payer: Medicare HMO | Admitting: Pain Medicine

## 2019-09-10 ENCOUNTER — Other Ambulatory Visit: Payer: Self-pay

## 2019-09-10 ENCOUNTER — Encounter: Payer: Self-pay | Admitting: Pain Medicine

## 2019-09-10 VITALS — BP 116/72 | HR 77 | Temp 97.6°F | Resp 22 | Ht 62.0 in | Wt 240.0 lb

## 2019-09-10 DIAGNOSIS — M545 Low back pain, unspecified: Secondary | ICD-10-CM

## 2019-09-10 DIAGNOSIS — M5136 Other intervertebral disc degeneration, lumbar region: Secondary | ICD-10-CM | POA: Insufficient documentation

## 2019-09-10 DIAGNOSIS — M51369 Other intervertebral disc degeneration, lumbar region without mention of lumbar back pain or lower extremity pain: Secondary | ICD-10-CM

## 2019-09-10 DIAGNOSIS — G8929 Other chronic pain: Secondary | ICD-10-CM | POA: Diagnosis not present

## 2019-09-10 DIAGNOSIS — M5416 Radiculopathy, lumbar region: Secondary | ICD-10-CM | POA: Insufficient documentation

## 2019-09-10 DIAGNOSIS — M5126 Other intervertebral disc displacement, lumbar region: Secondary | ICD-10-CM

## 2019-09-10 MED ORDER — IOHEXOL 180 MG/ML  SOLN
INTRAMUSCULAR | Status: AC
  Filled 2019-09-10: qty 20

## 2019-09-10 MED ORDER — SODIUM CHLORIDE 0.9% FLUSH
2.0000 mL | Freq: Once | INTRAVENOUS | Status: AC
  Administered 2019-09-10: 2 mL

## 2019-09-10 MED ORDER — LIDOCAINE HCL 2 % IJ SOLN
20.0000 mL | Freq: Once | INTRAMUSCULAR | Status: AC
  Administered 2019-09-10: 400 mg

## 2019-09-10 MED ORDER — SODIUM CHLORIDE (PF) 0.9 % IJ SOLN
INTRAMUSCULAR | Status: AC
  Filled 2019-09-10: qty 10

## 2019-09-10 MED ORDER — ROPIVACAINE HCL 2 MG/ML IJ SOLN
INTRAMUSCULAR | Status: AC
  Filled 2019-09-10: qty 10

## 2019-09-10 MED ORDER — LIDOCAINE HCL 2 % IJ SOLN
INTRAMUSCULAR | Status: AC
  Filled 2019-09-10: qty 20

## 2019-09-10 MED ORDER — TRIAMCINOLONE ACETONIDE 40 MG/ML IJ SUSP
40.0000 mg | Freq: Once | INTRAMUSCULAR | Status: AC
  Administered 2019-09-10: 40 mg

## 2019-09-10 MED ORDER — IOHEXOL 180 MG/ML  SOLN
10.0000 mL | Freq: Once | INTRAMUSCULAR | Status: AC
  Administered 2019-09-10: 10 mL via EPIDURAL

## 2019-09-10 MED ORDER — ROPIVACAINE HCL 2 MG/ML IJ SOLN
2.0000 mL | Freq: Once | INTRAMUSCULAR | Status: AC
  Administered 2019-09-10: 2 mL via EPIDURAL

## 2019-09-10 MED ORDER — TRIAMCINOLONE ACETONIDE 40 MG/ML IJ SUSP
INTRAMUSCULAR | Status: AC
  Filled 2019-09-10: qty 1

## 2019-09-10 NOTE — Progress Notes (Signed)
Safety precautions to be maintained throughout the outpatient stay will include: orient to surroundings, keep bed in low position, maintain call bell within reach at all times, provide assistance with transfer out of bed and ambulation.

## 2019-09-10 NOTE — Progress Notes (Signed)
Patient's Name: Theresa [name]  MRN: [id number]  Referring Provider: Margo [name], [place]  DOB: April 09, 1956  PCP: Theresa Common, PA  DOS: 09/10/2019  Note by: Theresa Cola, MD  Service setting: Ambulatory outpatient  Specialty: Interventional Pain Management  Patient type: Established  Location: [facility] (AMB) Pain Management Facility  Visit type: Interventional Procedure   Primary Reason for Visit: Interventional Pain Management Treatment. CC: Back Pain (mid)  Procedure:          Anesthesia, Analgesia, Anxiolysis:  Type: Diagnostic Inter-Laminar Epidural Steroid Injection  #1  Region: Lumbar Level: L2-3 Level. Laterality: Left Paramedial  Type: Moderate (Conscious) Sedation combined with Local Anesthesia Indication(s): Analgesia and Anxiety Route: Intravenous (IV) IV Access: Secured Sedation: Meaningful verbal contact was maintained at all times during the procedure  Local Anesthetic: Lidocaine 1-2%  Position: Prone with head of the table was raised to facilitate breathing.   Indications: 1. Chronic lumbar radiculopathy/radiculitis (L2) (Left)   2. 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 DDD (degenerative disc disease), lumbar   4. Chronic lumbar radicular pain (L4 & S1 Dermatome) (Left)   5. Chronic low back pain (Primary Area of Pain) (Bilateral) (L>R)    Pain Score: Pre-procedure: 0-No pain/10 Post-procedure: 0-No pain/10   Recent lumbar MRI revealed a large left subarticular disc extrusion at L2-3 that extends inferiorly into the left L3-4 neural foramen and displaces the descending left-sided nerve root. This could contribute to radicular symptoms in the left L3 or L4 distributions.  She comes in today indicating that she is having pain in the lower back, hip, knee, and going down into her foot, mostly on the left side, all of which goals with problems affecting the L2, L3, and L4 nerve roots.  Today I have explained to the patient that the epidural steroid injection  may be able to help with any swelling any rotation, but it will not push the disc back into place or relief any mechanical compression.  If this persists, we may have to get a consult with a surgeon, but unfortunately due to her medical problems, she may not be a candidate for surgery.  She understands.  Pre-op Assessment:  Theresa [name] is a 63 y.o. (year old), female patient, seen today for interventional treatment. She  has a past surgical history that includes Abdominal hysterectomy; Tonsillectomy; Carpal tunnel release (Left, 2012); Tubal ligation; epigastric hernia repair (N/A, 08/13/2018); Umbilical hernia repair (N/A, 08/13/2018); and RIGHT/LEFT HEART CATH AND CORONARY ANGIOGRAPHY (N/A, 07/11/2019). Theresa [name] has a current medication list which includes the following prescription(s): alprazolam, aspirin ec, baclofen, bisoprolol-hydrochlorothiazide, cholecalciferol, digoxin, fluticasone-umeclidin-vilant, gabapentin, hydrocodone-acetaminophen, hydrocodone-acetaminophen, hydrocodone-acetaminophen, ibuprofen, ipratropium-albuterol, levothyroxine, lidocaine, potassium chloride sa, sertraline, torsemide, and ventolin hfa. Her primarily concern today is the Back Pain (mid)  Initial Vital Signs:  Pulse/HCG Rate: 77ECG Heart Rate: 72 Temp: 97.6 F (36.4 C) Resp: (!) 22 BP: (!) 127/57 SpO2: 95 %  BMI: Estimated body mass index is 43.9 kg/m as calculated from the following:   Height as of this encounter: _0  (1.575 m).   Weight as of this encounter: 240 lb (108.9 kg).  Risk Assessment: Allergies: Reviewed. She is allergic to codeine and meloxicam.  Allergy Precautions: None required Coagulopathies: Reviewed. None identified.  Blood-thinner therapy: None at this time Active Infection(s): Reviewed. None identified. Theresa [name] is afebrile  Site Confirmation: Theresa [name] was asked to confirm the procedure and laterality before marking the site Procedure checklist: Completed Consent: Before the  procedure and under the influence  of no sedative(s), amnesic(s), or anxiolytics, the patient was informed of the treatment options, risks and possible complications. To fulfill our ethical and legal obligations, as recommended by the American Medical Association's Code of Ethics, I have informed the patient of my clinical impression; the nature and purpose of the treatment or procedure; the risks, benefits, and possible complications of the intervention; the alternatives, including doing nothing; the risk(s) and benefit(s) of the alternative treatment(s) or procedure(s); and the risk(s) and benefit(s) of doing nothing. The patient was provided information about the general risks and possible complications associated with the procedure. These may include, but are not limited to: failure to achieve desired goals, infection, bleeding, organ or nerve damage, allergic reactions, paralysis, and death. In addition, the patient was informed of those risks and complications associated to Spine-related procedures, such as failure to decrease pain; infection (i.e.: Meningitis, epidural or intraspinal abscess); bleeding (i.e.: epidural hematoma, subarachnoid hemorrhage, or any other type of intraspinal or peri-dural bleeding); organ or nerve damage (i.e.: Any type of peripheral nerve, nerve root, or spinal cord injury) with subsequent damage to sensory, motor, and/or autonomic systems, resulting in permanent pain, numbness, and/or weakness of one or several areas of the body; allergic reactions; (i.e.: anaphylactic reaction); and/or death. Furthermore, the patient was informed of those risks and complications associated with the medications. These include, but are not limited to: allergic reactions (i.e.: anaphylactic or anaphylactoid reaction(s)); adrenal axis suppression; blood sugar elevation that in diabetics may result in ketoacidosis or comma; water retention that in patients with history of congestive heart failure  may result in shortness of breath, pulmonary edema, and decompensation with resultant heart failure; weight gain; swelling or edema; medication-induced neural toxicity; particulate matter embolism and blood vessel occlusion with resultant organ, and/or nervous system infarction; and/or aseptic necrosis of one or more joints. Finally, the patient was informed that Medicine is not an exact science; therefore, there is also the possibility of unforeseen or unpredictable risks and/or possible complications that may result in a catastrophic outcome. The patient indicated having understood very clearly. We have given the patient no guarantees and we have made no promises. Enough time was given to the patient to ask questions, all of which were answered to the patient's satisfaction. Ms. Termine has indicated that she wanted to continue with the procedure. Attestation: I, the ordering provider, attest that I have discussed with the patient the benefits, risks, side-effects, alternatives, likelihood of achieving goals, and potential problems during recovery for the procedure that I have provided informed consent. Date  Time: 09/10/2019 11:20 AM  Pre-Procedure Preparation:  Monitoring: As per clinic protocol. Respiration, ETCO2, SpO2, BP, heart rate and rhythm monitor placed and checked for adequate function Safety Precautions: Patient was assessed for positional comfort and pressure points before starting the procedure. Time-out: I initiated and conducted the "Time-out" before starting the procedure, as per protocol. The patient was asked to participate by confirming the accuracy of the "Time Out" information. Verification of the correct person, site, and procedure were performed and confirmed by me, the nursing staff, and the patient. "Time-out" conducted as per Joint Commission's Universal Protocol (UP.01.01.01). Time: 1154  Description of Procedure:          Target Area: The interlaminar space, initially  targeting the lower laminar border of the superior vertebral body. Approach: Paramedial approach. Area Prepped: Entire Posterior Lumbar Region Prepping solution: DuraPrep (Iodine Povacrylex [0.7% available iodine] and Isopropyl Alcohol, 74% w/w) Safety Precautions: Aspiration looking for blood return was conducted  prior to all injections. At no point did we inject any substances, as a needle was being advanced. No attempts were made at seeking any paresthesias. Safe injection practices and needle disposal techniques used. Medications properly checked for expiration dates. SDV (single dose vial) medications used. Description of the Procedure: Protocol guidelines were followed. The procedure needle was introduced through the skin, ipsilateral to the reported pain, and advanced to the target area. Bone was contacted and the needle walked caudad, until the lamina was cleared. The epidural space was identified using "loss-of-resistance technique" with 2-3 ml of PF-NaCl (0.9% NSS), in a 5cc LOR glass syringe.  Vitals:   09/10/19 1150 09/10/19 1154 09/10/19 1157 09/10/19 1203  BP: 125/74 123/69 124/72 116/72  Pulse:      Resp: (!) _0 (!) 22  Temp:      SpO2: 95% 96% 96% 95%  Weight:      Height:        Start Time: 1154 hrs. End Time: 1201 hrs.  Materials:  Needle(s) Type: Epidural needle Gauge: 17G Length: 3.5-in Medication(s): Please see orders for medications and dosing details.  Imaging Guidance (Spinal):          Type of Imaging Technique: Fluoroscopy Guidance (Spinal) Indication(s): Assistance in needle guidance and placement for procedures requiring needle placement in or near specific anatomical locations not easily accessible without such assistance. Exposure Time: Please see nurses notes. Contrast: Before injecting any contrast, we confirmed that the patient did not have an allergy to iodine, shellfish, or radiological contrast. Once satisfactory needle placement was completed  at the desired level, radiological contrast was injected. Contrast injected under live fluoroscopy. No contrast complications. See chart for type and volume of contrast used. Fluoroscopic Guidance: I was personally present during the use of fluoroscopy. "Tunnel Vision Technique" used to obtain the best possible view of the target area. Parallax error corrected before commencing the procedure. "Direction-depth-direction" technique used to introduce the needle under continuous pulsed fluoroscopy. Once target was reached, antero-posterior, oblique, and lateral fluoroscopic projection used confirm needle placement in all planes. Images permanently stored in EMR. Interpretation: I personally interpreted the imaging intraoperatively. Adequate needle placement confirmed in multiple planes. Appropriate spread of contrast into desired area was observed. No evidence of afferent or efferent intravascular uptake. No intrathecal or subarachnoid spread observed. Permanent images saved into the patient's record.  Antibiotic Prophylaxis:   Anti-infectives (From admission, onward)   None     Indication(s): None identified  Post-operative Assessment:  Post-procedure Vital Signs:  Pulse/HCG Rate: 7768 Temp: 97.6 F (36.4 C) Resp: (!) 22 BP: 116/72 SpO2: 95 %  EBL: None  Complications: No immediate post-treatment complications observed by team, or reported by patient.  Note: The patient tolerated the entire procedure well. A repeat set of vitals were taken after the procedure and the patient was kept under observation following institutional policy, for this type of procedure. Post-procedural neurological assessment was performed, showing return to baseline, prior to discharge. The patient was provided with post-procedure discharge instructions, including a section on how to identify potential problems. Should any problems arise concerning this procedure, the patient was given instructions to immediately  contact us, at any time, without hesitation. In any case, we plan to contact the patient by telephone for a follow-up status report regarding this interventional procedure.  Comments:  No additional relevant information.  Plan of Care  Orders:  Orders Placed This Encounter  Procedures  . LESI (Today)    Scheduling Instructions:  Procedure: Interlaminar LESI L2-3     Laterality: Left-sided     Sedation: Patient's choice     Timeframe:  Today    Order Specific Question:   Where will this procedure be performed?    Answer:   [facility]  . Fluoro (C-Arm) (<60 min) (No Report)    Intraoperative interpretation by procedural physician at [place].    Standing Status:   Standing    Number of Occurrences:   1    Order Specific Question:   Reason for exam:    Answer:   Assistance in needle guidance and placement for procedures requiring needle placement in or near specific anatomical locations not easily accessible without such assistance.  . Consent: LESI    Provider Attestation: I, Rosebud Dossie Arbour, MD, (Pain Management Specialist), the physician/practitioner, attest that I have discussed with the patient the benefits, risks, side effects, alternatives, likelihood of achieving goals and potential problems during recovery for the procedure that I have provided informed consent.    Scheduling Instructions:     Procedure: Lumbar epidural steroid injection under fluoroscopic guidance     Indications: Low back and/or lower extremity pain secondary to lumbar radiculitis     Note: Always confirm laterality of pain with Ms. Alwyn Ren, before procedure.     Transcribe to consent form and obtain patient signature.  Marland Kitchen Epidural Tray    Equipment required: Single use, disposable, "Epidural Tray" Epidural Catheter: NOT required    Standing Status:   Standing    Number of Occurrences:   1    Order Specific Question:   Specify    Answer:   Epidural Tray   Chronic Opioid  Analgesic:  Hydrocodone/APAP 5/325 one tablet every 8 hours (15 mg/day of hydrocodone) MME/day:15 mg/day.   Medications ordered for procedure: Meds ordered this encounter  Medications  . iohexol (OMNIPAQUE) 180 MG/ML injection 10 mL    Must be Myelogram-compatible. If not available, you may substitute with a water-soluble, non-ionic, hypoallergenic, myelogram-compatible radiological contrast medium.  Marland Kitchen lidocaine (XYLOCAINE) 2 % (with pres) injection 400 mg  . sodium chloride flush (NS) 0.9 % injection 2 mL  . ropivacaine (PF) 2 mg/mL (0.2%) (NAROPIN) injection 2 mL  . triamcinolone acetonide (KENALOG-40) injection 40 mg   Medications administered: We administered iohexol, lidocaine, sodium chloride flush, ropivacaine (PF) 2 mg/mL (0.2%), and triamcinolone acetonide.  See the medical record for exact dosing, route, and time of administration.  Follow-up plan:   Return in about 2 weeks (around 09/24/2019) for (VV), (MM).       Interventional management options: Planned, scheduled, and/or pending: NOTE: NO further Lumbar RFAuntil BMI <35. Diagnostic left L2-3 LESI #1 today   Considering: Palliative procedures    Palliative PRN treatment(s): Palliativebilaterallumbar facetblock #4 Diagnostic/palliative left L4TFESI #2 Diagnostic/palliative left L4-5 LESI #3 Diagnostic/palliativerightL4 TFESI #2 Diagnostic/palliativerightL4-5 LESI #2 Palliative leftlumbar facet RFA#2(done on 06/27/2017) Palliative right lumbar facet RFA#2(done on 10/03/2017)    Recent Visits Date Type Provider Dept  08/14/19 Office Visit Milinda Pointer, MD [facility]  07/30/19 Procedure visit Milinda Pointer, MD [facility]  07/23/19 Office Visit Milinda Pointer, MD [facility]  Showing recent visits within past 90 days and meeting all other requirements   Today's Visits Date Type Provider Dept  09/10/19 Procedure visit Milinda Pointer, MD  [facility]  Showing today's visits and meeting all other requirements   Future Appointments Date Type Provider Dept  09/23/19 Appointment Milinda Pointer, MD [facility]  11/13/19 Appointment Milinda Pointer, MD [facility]  Showing future appointments within next 90 days and meeting all other requirements   Disposition: Discharge home  Discharge Date & Time: 09/10/2019; 1207 hrs.   Primary Care Physician: Theresa Common, PA Location: [facility] Note by: Theresa Cola, MD Date: 09/10/2019; Time: 12:39 PM  Disclaimer:  Medicine is not an Chief Strategy Officer. The only guarantee in medicine is that nothing is guaranteed. It is important to note that the decision to proceed with this intervention was based on the information collected from the patient. The Data and conclusions were drawn from the patient's questionnaire, the interview, and the physical examination. Because the information was provided in large part by the patient, it cannot be guaranteed that it has not been purposely or unconsciously manipulated. Every effort has been made to obtain as much relevant data as possible for this evaluation. It is important to note that the conclusions that lead to this procedure are derived in large part from the available data. Always take into account that the treatment will also be dependent on availability of resources and existing treatment guidelines, considered by other Pain Management Practitioners as being [name] knowledge and practice, at the time of the intervention. For Medico-Legal purposes, it is also important to point out that variation in procedural techniques and pharmacological choices are the acceptable norm. The indications, contraindications, technique, and results of the above procedure should only be interpreted and judged by a Board-Certified Interventional Pain Specialist with extensive familiarity and  expertise in the same exact procedure and technique.

## 2019-09-10 NOTE — Patient Instructions (Signed)
____________________________________________________________________________________________  Post-Procedure Discharge Instructions  Instructions:  Apply ice:   Purpose: This will minimize any swelling and discomfort after procedure.   When: Day of procedure, as soon as you get home.  How: Fill a plastic sandwich bag with crushed ice. Cover it with a small towel and apply to injection site.  How long: (15 min on, 15 min off) Apply for 15 minutes then remove x 15 minutes.  Repeat sequence on day of procedure, until you go to bed.  Apply heat:   Purpose: To treat any soreness and discomfort from the procedure.  When: Starting the next day after the procedure.  How: Apply heat to procedure site starting the day following the procedure.  How long: May continue to repeat daily, until discomfort goes away.  Food intake: Start with clear liquids (like water) and advance to regular food, as tolerated.   Physical activities: Keep activities to a minimum for the first 8 hours after the procedure. After that, then as tolerated.  Driving: If you have received any sedation, be responsible and do not drive. You are not allowed to drive for 24 hours after having sedation.  Blood thinner: (Applies only to those taking blood thinners) You may restart your blood thinner 6 hours after your procedure.  Insulin: (Applies only to Diabetic patients taking insulin) As soon as you can eat, you may resume your normal dosing schedule.  Infection prevention: Keep procedure site clean and dry. Shower daily and clean area with soap and water.  Post-procedure Pain Diary: Extremely important that this be done correctly and accurately. Recorded information will be used to determine the next step in treatment. For the purpose of accuracy, follow these rules:  Evaluate only the area treated. Do not report or include pain from an untreated area. For the purpose of this evaluation, ignore all other areas of pain,  except for the treated area.  After your procedure, avoid taking a long nap and attempting to complete the pain diary after you wake up. Instead, set your alarm clock to go off every hour, on the hour, for the initial 8 hours after the procedure. Document the duration of the numbing medicine, and the relief you are getting from it.  Do not go to sleep and attempt to complete it later. It will not be accurate. If you received sedation, it is likely that you were given a medication that may cause amnesia. Because of this, completing the diary at a later time may cause the information to be inaccurate. This information is needed to plan your care.  Follow-up appointment: Keep your post-procedure follow-up evaluation appointment after the procedure (usually 2 weeks for most procedures, 6 weeks for radiofrequencies). DO NOT FORGET to bring you pain diary with you.   Expect: (What should I expect to see with my procedure?)  From numbing medicine (AKA: Local Anesthetics): Numbness or decrease in pain. You may also experience some weakness, which if present, could last for the duration of the local anesthetic.  Onset: Full effect within 15 minutes of injected.  Duration: It will depend on the type of local anesthetic used. On the average, 1 to 8 hours.   From steroids (Applies only if steroids were used): Decrease in swelling or inflammation. Once inflammation is improved, relief of the pain will follow.  Onset of benefits: Depends on the amount of swelling present. The more swelling, the longer it will take for the benefits to be seen. In some cases, up to 10 days.  Duration: Steroids will stay in the system x 2 weeks. Duration of benefits will depend on multiple posibilities including persistent irritating factors.  Side-effects: If present, they may typically last 2 weeks (the duration of the steroids).  Frequent: Cramps (if they occur, drink Gatorade and take over-the-counter Magnesium 450-500 mg  once to twice a day); water retention with temporary weight gain; increases in blood sugar; decreased immune system response; increased appetite.  Occasional: Facial flushing (red, warm cheeks); mood swings; menstrual changes.  Uncommon: Long-term decrease or suppression of natural hormones; bone thinning. (These are more common with higher doses or more frequent use. This is why we prefer that our patients avoid having any injection therapies in other practices.)   Very Rare: Severe mood changes; psychosis; aseptic necrosis.  From procedure: Some discomfort is to be expected once the numbing medicine wears off. This should be minimal if ice and heat are applied as instructed.  Call if: (When should I call?)  You experience numbness and weakness that gets worse with time, as opposed to wearing off.  New onset bowel or bladder incontinence. (Applies only to procedures done in the spine)  Emergency Numbers:  Durning business hours (Monday - Thursday, 8:00 AM - 4:00 PM) (Friday, 9:00 AM - 12:00 Noon): [phone]  After hours: [phone]  NOTE: If you are having a problem and are unable connect with, or to talk to a provider, then go to your nearest urgent care or emergency department. If the problem is serious and urgent, please call 911. ____________________________________________________________________________________________

## 2019-09-11 ENCOUNTER — Telehealth: Payer: Self-pay

## 2019-09-11 NOTE — Telephone Encounter (Signed)
Post procedure is phone call.  LM

## 2019-09-15 ENCOUNTER — Other Ambulatory Visit: Payer: Self-pay | Admitting: Family

## 2019-09-19 ENCOUNTER — Encounter: Payer: Self-pay | Admitting: Pain Medicine

## 2019-09-19 NOTE — Progress Notes (Signed)
Pain relief after procedure (treated area only): (Questions asked to patient) 1. Starting about 15 minutes after the procedure, and "while the area was still numb" (from the local anesthetics), were you having any of your usual pain "in that area" (the treated area)?  (NOTE: NOT including the discomfort from the needle sticks.) First 1 hour:100% better. First 4-6 hours: 100 % better. 2. Assuming that it did get numb. How long was the area numb? 100 % benefit, longer than 6 hours. How long? Until the next day. 3. How much better is your pain now, when compared to before the procedure? Current benefit: 40 % better. 4. Can you move better now? Improvement in ROM (Range of Motion): Yes. 5. Can you do more now? Improvement in function: No. 4. Did you have any problems with the procedure? Side-effects/Complications: No.

## 2019-09-22 DIAGNOSIS — J9610 Chronic respiratory failure, unspecified whether with hypoxia or hypercapnia: Secondary | ICD-10-CM | POA: Diagnosis not present

## 2019-09-23 ENCOUNTER — Ambulatory Visit: Payer: Medicare HMO | Attending: Pain Medicine | Admitting: Pain Medicine

## 2019-09-23 ENCOUNTER — Other Ambulatory Visit: Payer: Self-pay

## 2019-09-23 DIAGNOSIS — G8929 Other chronic pain: Secondary | ICD-10-CM | POA: Diagnosis not present

## 2019-09-23 DIAGNOSIS — M25552 Pain in left hip: Secondary | ICD-10-CM | POA: Diagnosis not present

## 2019-09-23 DIAGNOSIS — M545 Low back pain, unspecified: Secondary | ICD-10-CM

## 2019-09-23 DIAGNOSIS — G894 Chronic pain syndrome: Secondary | ICD-10-CM | POA: Diagnosis not present

## 2019-09-23 DIAGNOSIS — M25551 Pain in right hip: Secondary | ICD-10-CM | POA: Diagnosis not present

## 2019-09-23 DIAGNOSIS — M79605 Pain in left leg: Secondary | ICD-10-CM | POA: Diagnosis not present

## 2019-09-23 MED ORDER — HYDROCODONE-ACETAMINOPHEN 5-325 MG PO TABS: 1.0000 | ORAL_TABLET | Freq: Three times a day (TID) | ORAL | 0 refills | Status: DC | PRN

## 2019-09-23 NOTE — Progress Notes (Signed)
Pain Management Virtual Encounter Note - Virtual Visit via Telephone Telehealth (real-time audio visits between healthcare provider and patient).   Patient's Phone No. & Preferred Pharmacy:  [phone] (home); [phone] (mobile); (Preferred) [phone] [id number] [address], [address] [place] [address] Phone: [phone] Fax: [phone]    Pre-screening note:  Our staff contacted Theresa [name] and offered her an "in person", "face-to-face" appointment versus a telephone encounter. She indicated preferring the telephone encounter, at this time.   Reason for Virtual Visit: COVID-19*  Social distancing based on CDC and AMA recommendations.   I contacted Theresa [name] on 09/23/2019 via telephone.      I clearly identified myself as Gaspar Cola, MD. I verified that I was speaking with the correct person using two identifiers (Name: Theresa [name], and date of birth: 08/03/1956).  Advanced Informed Consent I sought verbal advanced consent from Theresa [name] for virtual visit interactions. I informed Theresa [name] of possible security and privacy concerns, risks, and limitations associated with providing "not-in-person" medical evaluation and management services. I also informed Theresa [name] of the availability of "in-person" appointments. Finally, I informed her that there would be a charge for the virtual visit and that she could be  personally, fully or partially, financially responsible for it. Theresa [name] expressed understanding and agreed to proceed.   Historic Elements   Theresa [name] is a 63 y.o. year old, female patient evaluated today after her last encounter by our practice on 09/11/2019. Theresa [name]  has a past medical history of Acute postoperative pain (10/03/2017), Anxiety, BiPAP (biphasic positive airway pressure) dependence, Carpal tunnel syndrome, CHF (congestive  heart failure) ([place]), CHF (congestive heart failure) ([place]), Chronic generalized abdominal pain, Chronic rhinitis, COPD (chronic obstructive pulmonary disease) ([place]), DDD (degenerative disc disease), cervical, DDD (degenerative disc disease), lumbosacral, Depression, Dyspnea, Edema, Flu, Gastritis, GERD (gastroesophageal reflux disease), Hematuria, Hernia of abdominal wall (07/17/2018), Hernia, abdominal, Hypertension, Kidney stones, Low back pain, Lumbar radiculitis, Malodorous urine, Muscle weakness, Obesity, Oxygen dependent, Renal cyst, Sensory urge incontinence, Thyroid activity decreased, Tobacco abuse, and Wheezing. She also  has a past surgical history that includes Abdominal hysterectomy; Tonsillectomy; Carpal tunnel release (Left, 2012); Tubal ligation; epigastric hernia repair (N/A, 08/13/2018); Umbilical hernia repair (N/A, 08/13/2018); and RIGHT/LEFT HEART CATH AND CORONARY ANGIOGRAPHY (N/A, 07/11/2019). Theresa [name] has a current medication list which includes the following prescription(s): alprazolam, aspirin ec, baclofen, bisoprolol-hydrochlorothiazide, cholecalciferol, digoxin, fluticasone-umeclidin-vilant, gabapentin, hydrocodone-acetaminophen, hydrocodone-acetaminophen, hydrocodone-acetaminophen, ibuprofen, ipratropium-albuterol, levothyroxine, potassium chloride sa, sertraline, torsemide, and ventolin hfa. She  reports that she has been smoking cigarettes. She has a 44.00 pack-year smoking history. She has never used smokeless tobacco. She reports that she does not drink alcohol or use drugs. Theresa [name] is allergic to codeine and meloxicam.   HPI  Today, she is being contacted for both, medication management and a post-procedure assessment. Recent lumbar MRI revealed a large left subarticular disc extrusion at L2-3 that extends inferiorly into the left L3-4 neural foramen and displaces the descending left-sided nerve root. This could contribute to radicular symptoms in the left L3 or L4  distributions.  She comes in today indicating that she is having pain in the lower back, hip, knee, and going down into her foot, mostly on the left side, all of which goals with problems affecting the L2, L3, and L4 nerve roots.  We talked about the possibility of having a neurosurgeon  take a look at what she has in there and see if there is anything that they can offer her.  I know that she is a high risk patient, but it is not up to me to decide whether or not she is a good surgical candidate.  Based on her pathology, she could be a candidate for surgery, but this is not taking into account all of her other medical problems and all of the other findings seen on her MRI.  It is best to have a neurosurgical opinion regarding this.  The patient indicates that she was recommended to see Dr. Cari Caraway and I have informed her that he has a good reputation and he may be able to help her.  I have asked her to keep me appraised regarding his decision.  According to the patient the left-sided L2-3 LESI did help her left knee pain were she is currently experiencing no knee pain.  This again would suggest that the pain in the area of the left knee, seems to have a radicular component to it.  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  In the case of the right hip and thigh, she still having pain in that area.  Post-Procedure Evaluation  Procedure: Diagnostic left-sided L2-3 LESI #1 under fluoroscopic guidance and IV sedation Pre-procedure pain level:  0/10 Post-procedure: 0/10 (100% relief)  Sedation: Sedation provided.  Landis Martins, RN  09/19/2019  1:25 PM  Sign when Signing Visit Pain relief after procedure (treated area only): (Questions asked to patient) 1. Starting about 15 minutes after the procedure, and "while the area was still numb" (from the local anesthetics), were you having any of  your usual pain "in that area" (the treated area)?  (NOTE: NOT including the discomfort from the needle sticks.) First 1 hour:100% better. First 4-6 hours: 100 % better. 2. Assuming that it did get numb. How long was the area numb? 100 % benefit, longer than 6 hours. How long? Until the next day. 3. How much better is your pain now, when compared to before the procedure? Current benefit: 40 % better. 4. Can you move better now? Improvement in ROM (Range of Motion): Yes. 5. Can you do more now? Improvement in function: No. 4. Did you have any problems with the procedure? Side-effects/Complications: No.   Current benefits: Defined as benefit that persist at this time.   Analgesia:  <50% better Function: Theresa [name] reports improvement in function ROM: Somewhat improved  Pharmacotherapy Assessment  Analgesic: Hydrocodone/APAP 5/325 one tablet every 8 hours (15 mg/day of hydrocodone) MME/day:15 mg/day.   Monitoring: Pharmacotherapy: No side-effects or adverse reactions reported. [place] PMP: PDMP reviewed during this encounter.       Compliance: No problems identified. Effectiveness: Clinically acceptable. Plan: Refer to "POC".  UDS:  Summary  Date Value Ref Range Status  07/31/2018 FINAL  Final    Comment:    ==================================================================== TOXASSURE SELECT 13 (MW) ==================================================================== Test                             Result       Flag       Units Drug Present and Declared for Prescription Verification   Hydrocodone                    688  EXPECTED   ng/mg creat   Norhydrocodone                 552          EXPECTED   ng/mg creat    Sources of hydrocodone include scheduled prescription    medications. Norhydrocodone is an expected metabolite of    hydrocodone. Drug Absent but Declared for Prescription Verification   Alprazolam                     Not Detected UNEXPECTED ng/mg  creat ==================================================================== Test                      Result    Flag   Units      Ref Range   Creatinine              25               mg/dL      >=20 ==================================================================== Declared Medications:  The flagging and interpretation on this report are based on the  following declared medications.  Unexpected results may arise from  inaccuracies in the declared medications.  **Note: The testing scope of this panel includes these medications:  Alprazolam  Hydrocodone (Hydrocodone-Acetaminophen)  **Note: The testing scope of this panel does not include following  reported medications:  Acetaminophen (Hydrocodone-Acetaminophen)  Albuterol  Albuterol (Ipratropium-Albuterol)  Aspirin (Aspirin 81)  Bisoprolol (Ziac)  Fluticasone (Advair)  Gabapentin  Hydrochlorothiazide (Ziac)  Ipratropium (Ipratropium-Albuterol)  Levothyroxine  Oxygen  Potassium (K-Dur)  Ranitidine  Roflumilast  Salmeterol (Advair)  Sertraline  Torsemide ==================================================================== For clinical consultation, please call [phone]. ====================================================================    Laboratory Chemistry Profile (12 mo)  Renal: 11/16/2018: BUN/Creatinine Ratio 24 07/14/2019: BUN 31; Creatinine, Ser 0.75  Lab Results  Component Value Date   GFRAA >60 07/14/2019   GFRNONAA >60 07/14/2019   Hepatic: 11/16/2018: Albumin 4.3 Lab Results  Component Value Date   AST 20 11/16/2018   ALT 20 11/16/2018   Other: No results found for requested labs within last 8760 hours. Note: Above Lab results reviewed.  Imaging  Fluoro (C-Arm) (<60 min) (No Report) Fluoro was used, but no Radiologist interpretation will be provided.  Please refer to "NOTES" tab for provider progress note.   Assessment  The primary encounter diagnosis was Chronic pain syndrome (significant  psychosocial component). Diagnoses of Chronic low back pain (Primary Area of Pain) (Bilateral) (L>R), Chronic lower extremity pain (Secondary Area of Pain) (Left), and Chronic hip pain (Tertiary Area of Pain) (Bilateral) (L>R) were also pertinent to this visit.  Plan of Care  Problem-specific:  No problem-specific Assessment & Plan notes found for this encounter.  I have discontinued Dandra Tkach's lidocaine. I am also having her start on HYDROcodone-acetaminophen. Additionally, I am having her maintain her Cholecalciferol, ALPRAZolam, Ventolin HFA, ibuprofen, Fluticasone-Umeclidin-Vilant, sertraline, ipratropium-albuterol, bisoprolol-hydrochlorothiazide, aspirin EC, torsemide, gabapentin, HYDROcodone-acetaminophen, baclofen, levothyroxine, digoxin, potassium chloride SA, and HYDROcodone-acetaminophen.  Pharmacotherapy (Medications Ordered): Meds ordered this encounter  Medications  . HYDROcodone-acetaminophen (NORCO/VICODIN) 5-325 MG tablet    Sig: Take 1 tablet by mouth every 8 (eight) hours as needed for severe pain. Must last 30 days    Dispense:  90 tablet    Refill:  0    Chronic Pain: STOP Act (Not applicable) Fill 1 day early if closed on refill date. Do not fill until: 11/14/2019. To last until: 12/14/2019. Avoid benzodiazepines within 8 hours of opioids  . HYDROcodone-acetaminophen (NORCO/VICODIN) 5-325  MG tablet    Sig: Take 1 tablet by mouth every 8 (eight) hours as needed for severe pain. Must last 30 days    Dispense:  90 tablet    Refill:  0    Chronic Pain: STOP Act (Not applicable) Fill 1 day early if closed on refill date. Do not fill until: 12/14/2019. To last until: 01/13/2020. Avoid benzodiazepines within 8 hours of opioids   Orders:  No orders of the defined types were placed in this encounter.  Follow-up plan:   Return in about 16 weeks (around 01/13/2020) for (VV), (MM).      Interventional management options: Planned, scheduled, and/or pending: NOTE: NO further  Lumbar RFAuntil BMI <35. Diagnostic left L2-3 LESI #1 today   Considering: Palliative procedures    Palliative PRN treatment(s): Palliativebilaterallumbar facetblock #4 Diagnostic/palliative left L4TFESI #2 Diagnostic/palliative left L4-5 LESI #3 Diagnostic/palliativerightL4 TFESI #2 Diagnostic/palliativerightL4-5 LESI #2 Palliative leftlumbar facet RFA#2(done on 06/27/2017) Palliative right lumbar facet RFA#2(done on 10/03/2017)     Recent Visits Date Type Provider Dept  09/10/19 Procedure visit Milinda Pointer, MD [facility]  08/14/19 Office Visit Milinda Pointer, MD [facility]  07/30/19 Procedure visit Milinda Pointer, MD [facility]  07/23/19 Office Visit Milinda Pointer, MD [facility]  Showing recent visits within past 90 days and meeting all other requirements   Today's Visits Date Type Provider Dept  09/23/19 Telemedicine Milinda Pointer, MD [facility]  Showing today's visits and meeting all other requirements   Future Appointments Date Type Provider Dept  11/13/19 Appointment Milinda Pointer, MD [facility]  Showing future appointments within next 90 days and meeting all other requirements   I discussed the assessment and treatment plan with the patient. The patient was provided an opportunity to ask questions and all were answered. The patient agreed with the plan and demonstrated an understanding of the instructions.  Patient advised to call back or seek an in-person evaluation if the symptoms or condition worsens.  Total duration of non-face-to-face encounter: 13 minutes.  Note by: Gaspar Cola, MD Date: 09/23/2019; Time: 3:11 PM  Note: This dictation was prepared with Dragon dictation. Any transcriptional errors that may result from this process are unintentional.  Disclaimer:  * Given the special circumstances of the COVID-19 pandemic, the federal  government has announced that the Office for Civil Rights (OCR) will exercise its enforcement discretion and will not impose penalties on physicians using telehealth in the event of noncompliance with regulatory requirements under the [place] (HIPAA) in connection with the good faith provision of telehealth during the YWVPX-10 national public health emergency. ([place])

## 2019-09-24 ENCOUNTER — Other Ambulatory Visit: Payer: Self-pay | Admitting: Family Medicine

## 2019-09-26 DIAGNOSIS — M5416 Radiculopathy, lumbar region: Secondary | ICD-10-CM | POA: Diagnosis not present

## 2019-10-11 ENCOUNTER — Emergency Department (HOSPITAL_COMMUNITY): Payer: Medicare HMO

## 2019-10-11 ENCOUNTER — Other Ambulatory Visit: Payer: Self-pay

## 2019-10-11 ENCOUNTER — Emergency Department (HOSPITAL_COMMUNITY)
Admission: EM | Admit: 2019-10-11 | Discharge: 2019-10-12 | Disposition: A | Discharge status: Home / Self Care | Payer: Medicare HMO | Attending: Emergency Medicine | Admitting: Emergency Medicine

## 2019-10-11 ENCOUNTER — Encounter (HOSPITAL_COMMUNITY): Payer: Self-pay | Admitting: Emergency Medicine

## 2019-10-11 DIAGNOSIS — Z7982 Long term (current) use of aspirin: Secondary | ICD-10-CM | POA: Diagnosis not present

## 2019-10-11 DIAGNOSIS — R069 Unspecified abnormalities of breathing: Secondary | ICD-10-CM | POA: Diagnosis not present

## 2019-10-11 DIAGNOSIS — J449 Chronic obstructive pulmonary disease, unspecified: Secondary | ICD-10-CM | POA: Insufficient documentation

## 2019-10-11 DIAGNOSIS — R29810 Facial weakness: Secondary | ICD-10-CM | POA: Diagnosis not present

## 2019-10-11 DIAGNOSIS — M25512 Pain in left shoulder: Secondary | ICD-10-CM | POA: Diagnosis not present

## 2019-10-11 DIAGNOSIS — Z79899 Other long term (current) drug therapy: Secondary | ICD-10-CM | POA: Diagnosis not present

## 2019-10-11 DIAGNOSIS — R4020 Unspecified coma: Secondary | ICD-10-CM | POA: Diagnosis not present

## 2019-10-11 DIAGNOSIS — I959 Hypotension, unspecified: Secondary | ICD-10-CM | POA: Diagnosis not present

## 2019-10-11 DIAGNOSIS — F17210 Nicotine dependence, cigarettes, uncomplicated: Secondary | ICD-10-CM | POA: Insufficient documentation

## 2019-10-11 DIAGNOSIS — S199XXA Unspecified injury of neck, initial encounter: Secondary | ICD-10-CM | POA: Diagnosis not present

## 2019-10-11 DIAGNOSIS — R531 Weakness: Secondary | ICD-10-CM | POA: Diagnosis not present

## 2019-10-11 DIAGNOSIS — R4182 Altered mental status, unspecified: Secondary | ICD-10-CM | POA: Insufficient documentation

## 2019-10-11 DIAGNOSIS — I5032 Chronic diastolic (congestive) heart failure: Secondary | ICD-10-CM | POA: Insufficient documentation

## 2019-10-11 DIAGNOSIS — S0990XA Unspecified injury of head, initial encounter: Secondary | ICD-10-CM | POA: Diagnosis not present

## 2019-10-11 DIAGNOSIS — Z20828 Contact with and (suspected) exposure to other viral communicable diseases: Secondary | ICD-10-CM | POA: Diagnosis not present

## 2019-10-11 DIAGNOSIS — I110 Hypertensive heart disease with heart failure: Secondary | ICD-10-CM | POA: Diagnosis not present

## 2019-10-11 DIAGNOSIS — F10929 Alcohol use, unspecified with intoxication, unspecified: Secondary | ICD-10-CM | POA: Diagnosis not present

## 2019-10-11 DIAGNOSIS — S4992XA Unspecified injury of left shoulder and upper arm, initial encounter: Secondary | ICD-10-CM | POA: Diagnosis not present

## 2019-10-11 DIAGNOSIS — R410 Disorientation, unspecified: Secondary | ICD-10-CM | POA: Diagnosis not present

## 2019-10-11 LAB — DIFFERENTIAL
Abs Immature Granulocytes: 0.08 10*3/uL — ABNORMAL HIGH (ref 0.00–0.07)
Basophils Absolute: 0.1 10*3/uL (ref 0.0–0.1)
Basophils Relative: 1 %
Eosinophils Absolute: 0.2 10*3/uL (ref 0.0–0.5)
Eosinophils Relative: 2 %
Immature Granulocytes: 1 %
Lymphocytes Relative: 15 %
Lymphs Abs: 1.7 10*3/uL (ref 0.7–4.0)
Monocytes Absolute: 0.9 10*3/uL (ref 0.1–1.0)
Monocytes Relative: 8 %
Neutro Abs: 8.5 10*3/uL — ABNORMAL HIGH (ref 1.7–7.7)
Neutrophils Relative %: 73 %

## 2019-10-11 LAB — I-STAT CHEM 8, ED
BUN: 23 mg/dL (ref 8–23)
Calcium, Ion: 1.05 mmol/L — ABNORMAL LOW (ref 1.15–1.40)
Chloride: 101 mmol/L (ref 98–111)
Creatinine, Ser: 1 mg/dL (ref 0.44–1.00)
Glucose, Bld: 122 mg/dL — ABNORMAL HIGH (ref 70–99)
HCT: 45 % (ref 36.0–46.0)
Hemoglobin: 15.3 g/dL — ABNORMAL HIGH (ref 12.0–15.0)
Potassium: 3.9 mmol/L (ref 3.5–5.1)
Sodium: 144 mmol/L (ref 135–145)
TCO2: 35 mmol/L — ABNORMAL HIGH (ref 22–32)

## 2019-10-11 LAB — CBC
HCT: 45.8 % (ref 36.0–46.0)
Hemoglobin: 14.3 g/dL (ref 12.0–15.0)
MCH: 31.9 pg (ref 26.0–34.0)
MCHC: 31.2 g/dL (ref 30.0–36.0)
MCV: 102.2 fL — ABNORMAL HIGH (ref 80.0–100.0)
Platelets: 155 10*3/uL (ref 150–400)
RBC: 4.48 MIL/uL (ref 3.87–5.11)
RDW: 16.6 % — ABNORMAL HIGH (ref 11.5–15.5)
WBC: 11.4 10*3/uL — ABNORMAL HIGH (ref 4.0–10.5)
nRBC: 0 % (ref 0.0–0.2)

## 2019-10-11 LAB — CK: Total CK: 85 U/L (ref 38–234)

## 2019-10-11 LAB — PROTIME-INR
INR: 1 (ref 0.8–1.2)
Prothrombin Time: 12.8 seconds (ref 11.4–15.2)

## 2019-10-11 LAB — TROPONIN I (HIGH SENSITIVITY)
Troponin I (High Sensitivity): 9 ng/L (ref <18)
Troponin I (High Sensitivity): 9 ng/L (ref <18)

## 2019-10-11 LAB — COMPREHENSIVE METABOLIC PANEL
ALT: 25 U/L (ref 0–44)
AST: 29 U/L (ref 15–41)
Albumin: 3.4 g/dL — ABNORMAL LOW (ref 3.5–5.0)
Alkaline Phosphatase: 106 U/L (ref 38–126)
Anion gap: 10 (ref 5–15)
BUN: 18 mg/dL (ref 8–23)
CO2: 31 mmol/L (ref 22–32)
Calcium: 8.9 mg/dL (ref 8.9–10.3)
Chloride: 103 mmol/L (ref 98–111)
Creatinine, Ser: 1.04 mg/dL — ABNORMAL HIGH (ref 0.44–1.00)
GFR calc Af Amer: >60 mL/min (ref >60)
GFR calc non Af Amer: 58 mL/min — ABNORMAL LOW (ref >60)
Glucose, Bld: 130 mg/dL — ABNORMAL HIGH (ref 70–99)
Potassium: 4.1 mmol/L (ref 3.5–5.1)
Sodium: 144 mmol/L (ref 135–145)
Total Bilirubin: 0.5 mg/dL (ref 0.3–1.2)
Total Protein: 6.5 g/dL (ref 6.5–8.1)

## 2019-10-11 LAB — POCT I-STAT 7, (LYTES, BLD GAS, ICA,H+H)
Acid-Base Excess: 5 mmol/L — ABNORMAL HIGH (ref 0.0–2.0)
Bicarbonate: 33.7 mmol/L — ABNORMAL HIGH (ref 20.0–28.0)
Calcium, Ion: 1.16 mmol/L (ref 1.15–1.40)
HCT: 41 % (ref 36.0–46.0)
Hemoglobin: 13.9 g/dL (ref 12.0–15.0)
O2 Saturation: 98 %
Patient temperature: 97.7
Potassium: 3.6 mmol/L (ref 3.5–5.1)
Sodium: 144 mmol/L (ref 135–145)
TCO2: 36 mmol/L — ABNORMAL HIGH (ref 22–32)
pCO2 arterial: 63.2 mmHg — ABNORMAL HIGH (ref 32.0–48.0)
pH, Arterial: 7.332 — ABNORMAL LOW (ref 7.350–7.450)
pO2, Arterial: 108 mmHg (ref 83.0–108.0)

## 2019-10-11 LAB — URINALYSIS, ROUTINE W REFLEX MICROSCOPIC
Bilirubin Urine: NEGATIVE
Glucose, UA: NEGATIVE mg/dL
Hgb urine dipstick: NEGATIVE
Ketones, ur: NEGATIVE mg/dL
Leukocytes,Ua: NEGATIVE
Nitrite: NEGATIVE
Protein, ur: NEGATIVE mg/dL
Specific Gravity, Urine: 1.011 (ref 1.005–1.030)
pH: 5 (ref 5.0–8.0)

## 2019-10-11 LAB — APTT: aPTT: 29 seconds (ref 24–36)

## 2019-10-11 LAB — DIGOXIN LEVEL: Digoxin Level: 1.5 ng/mL (ref 0.8–2.0)

## 2019-10-11 LAB — AMMONIA: Ammonia: 40 umol/L — ABNORMAL HIGH (ref 9–35)

## 2019-10-11 LAB — SARS CORONAVIRUS 2 (TAT 6-24 HRS): SARS Coronavirus 2: NEGATIVE

## 2019-10-11 LAB — RAPID URINE DRUG SCREEN, HOSP PERFORMED
Amphetamines: NOT DETECTED
Barbiturates: NOT DETECTED
Benzodiazepines: NOT DETECTED
Cocaine: NOT DETECTED
Opiates: NOT DETECTED
Tetrahydrocannabinol: NOT DETECTED

## 2019-10-11 LAB — CBG MONITORING, ED: Glucose-Capillary: 101 mg/dL — ABNORMAL HIGH (ref 70–99)

## 2019-10-11 LAB — ETHANOL: Alcohol, Ethyl (B): <10 mg/dL (ref <10)

## 2019-10-11 NOTE — ED Notes (Signed)
Contacted husband for discharge, lives 30-40 mins away

## 2019-10-11 NOTE — ED Notes (Addendum)
Pt alert to name at times, will not follow commands when asked, but has been moving all limbs equally. Bruising noted to R buttock. MRI placed pt in for transport

## 2019-10-11 NOTE — ED Notes (Signed)
RT notified of ABG

## 2019-10-11 NOTE — Discharge Instructions (Addendum)
Follow-up with your doctor as needed

## 2019-10-11 NOTE — ED Triage Notes (Signed)
Pt arrives via EMS from home with reports of husband finding pt at 7 am lying prone on the floor "looking dead" but arouses to voice. Husband reports pt fell sometime between 4am-7am.

## 2019-10-11 NOTE — ED Provider Notes (Addendum)
[place] EMERGENCY DEPARTMENT Provider Note   CSN: [id number] Arrival date & time: 10/11/19  1722     History Chief Complaint  Patient presents with  . Altered Mental Status    Theresa [name] is a 63 y.o. female.  63 year old female presents from home due to increased weakness.  Patient reportedly fell during the night had trouble getting up.  Patient states that normally she takes 3 hydrocodone a day but has not had any recently.  Throughout the day she continued to complain of profound weakness.  No recent fever, vomiting, diarrhea.  She currently has new onset left lower extremity weakness.  She has been unable to ambulate.  Also complains of left shoulder discomfort.  From the fall.  No chest or abdominal discomfort.  Denies any history of volume loss.  Unsure of how she fell.  Denies any recent changes to her medications.  EMS called and patient transported here.        Past Medical History:  Diagnosis Date  . Acute postoperative pain 10/03/2017  . Anxiety   . BiPAP (biphasic positive airway pressure) dependence    at hs  . Carpal tunnel syndrome   . CHF (congestive heart failure) ([place])    1/18  . CHF (congestive heart failure) ([place])   . Chronic generalized abdominal pain   . Chronic rhinitis   . COPD (chronic obstructive pulmonary disease) ([place])   . DDD (degenerative disc disease), cervical   . DDD (degenerative disc disease), lumbosacral   . Depression   . Dyspnea   . Edema   . Flu    1/18  . Gastritis   . GERD (gastroesophageal reflux disease)   . Hematuria   . Hernia of abdominal wall 07/17/2018  . Hernia, abdominal   . Hypertension   . Kidney stones   . Low back pain   . Lumbar radiculitis   . Malodorous urine   . Muscle weakness   . Obesity   . Oxygen dependent   . Renal cyst   . Sensory urge incontinence   . Thyroid activity decreased    9/19  . Tobacco abuse   . Wheezing     Patient Active Problem List   Diagnosis  Date Noted  . Protruded lumbar disc (L2-3) (Left) 09/10/2019  . Chronic knee pain (Left) 08/14/2019  . Chronic lumbar radiculopathy/radiculitis (L2) (Left) 08/14/2019  . Abnormal MRI, lumbar spine (07/23/2019) 08/14/2019  . Acute on chronic respiratory failure with hypoxia ([place]) 07/06/2019  . Cervicalgia (Bilateral) (R>L) 09/24/2018  . Spondylosis without myelopathy or radiculopathy, lumbosacral region 09/24/2018  . Epigastric hernia 05/04/2018  . Chronic lower extremity pain (Bilateral) (L>R) 01/25/2018  . Altered mental status, unspecified 12/09/2017  . Chronic hip pain [facility]) (Bilateral) (L>R) 11/27/2017  . COPD (chronic obstructive pulmonary disease) ([place]) 08/11/2017  . Other long term (current) drug therapy 08/08/2017  . Disorder of bone, unspecified 08/08/2017  . COPD with hypoxia ([place]) 06/27/2017  . Arthralgia of acromioclavicular joint (Right) 06/15/2017  . Acromioclavicular joint DJD (Right) 06/15/2017  . 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  . Chronic shoulder pain (Left) 05/11/2017  . Elevated brain natriuretic peptide (BNP) level 05/11/2017  . Grief at loss of child 02/14/2017  . Lumbar facet hypertrophy (multilevel) 11/17/2016  . Lumbar spinal stenosis (L4-5) 11/17/2016  . 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) 11/17/2016  . 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 Lumbosacral radiculopathy at L4 11/17/2016  . Supplemental oxygen  dependent 08/01/2016  . Sleep apnea 06/02/2016  . Long term current use of opiate analgesic 01/12/2016  . Long term prescription opiate use 01/12/2016  . Opiate use (15 MME/Day) 01/12/2016  . Lumbar facet syndrome (Bilateral) (L>R) 01/12/2016  . Chronic sacroiliac joint pain (Bilateral) 01/12/2016  . Vitamin D deficiency 12/30/2015  . 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 Obesity, Class III, BMI 40-49.9 (morbid obesity) ([place]) 12/28/2015  . Pulmonary hypertensive  arterial disease ([place]) 11/19/2015  . Coagulation disorder ([place]) 10/22/2015  . Chronic pain syndrome (significant psychosocial component) 09/21/2015  . 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 09/21/2015  . Encounter for therapeutic drug level monitoring 09/16/2015  . Encounter for chronic pain management 09/16/2015  . Pain management 09/16/2015  . Neurogenic pain 09/16/2015  . Neuropathic pain 09/16/2015  . Musculoskeletal pain 09/16/2015  . Lumbar spondylosis (L4-5) 09/16/2015  . Chronic low back pain (Primary Area of Pain) (Bilateral) (L>R) 09/16/2015  . Chronic lower extremity pain (Secondary Area of Pain) (Left) 09/16/2015  . Chronic lumbar radicular pain (L4 & S1 Dermatome) (Left) 09/16/2015  . Chronic shoulder pain (Right side) 09/16/2015  . Chronic upper extremity pain (Right-sided) 09/16/2015  . Cervical radiculitis (Right side) 09/16/2015  . Abnormal x-ray of lumbar spine 09/16/2015  . Chronic diastolic heart failure ([place]) 07/15/2015  . Chest pain 07/15/2015  . Tobacco abuse 07/15/2015  . Blood clotting disorder ([place]) 04/21/2015  . Decreased motor strength 07/16/2014  . Clinical depression 07/16/2014  . Chronic rhinitis 07/16/2014  . Carpal tunnel syndrome 07/16/2014  . Anxiety state 07/16/2014  . Neuritis or radiculitis due to rupture of lumbar intervertebral disc 07/02/2014  . DDD (degenerative disc disease), lumbar 07/02/2014  . CAFL (chronic airflow limitation) ([place]) 02/27/2014  . Essential (primary) hypertension 03/27/2008    Past Surgical History:  Procedure Laterality Date  . ABDOMINAL HYSTERECTOMY    . CARPAL TUNNEL RELEASE Left 2012  . EPIGASTRIC HERNIA REPAIR N/A 08/13/2018   HERNIA REPAIR EPIGASTRIC ADULT; polypropylene mesh reinforcement.  Surgeon: Robert Bellow, MD;  Location: [facility] ORS;  Service: General;  Laterality: N/A;  . RIGHT/LEFT HEART CATH AND CORONARY ANGIOGRAPHY N/A 07/11/2019   Procedure: RIGHT/LEFT HEART CATH AND  CORONARY ANGIOGRAPHY;  Surgeon: Yolonda Kida, MD;  Location: [facility];  Service: Cardiovascular;  Laterality: N/A;  . TONSILLECTOMY    . TUBAL LIGATION    . UMBILICAL HERNIA REPAIR N/A 08/13/2018   HERNIA REPAIR UMBILICAL ADULT; polypropylene mesh reinforcement Surgeon: Robert Bellow, MD;  Location: [facility] ORS;  Service: General;  Laterality: N/A;     OB History    Gravida  2   Para  2   Term  2   Preterm  0   AB  0   Living  2     SAB  0   TAB  0   Ectopic  0   Multiple      Live Births  2        Obstetric Comments  1st Menstrual Cycle:  60  1st Pregnancy:  22         Family History  Problem Relation Age of Onset  . Heart disease Mother   . Stroke Mother   . Coronary artery disease Mother   . Lung cancer Sister   . Cancer Sister   . Breast cancer Sister   . Alcohol abuse Father   . Heart disease Father   . Cancer Brother   . Cancer Brother   .  Pneumonia Brother   . Prostate cancer Neg Hx   . Kidney cancer Neg Hx   . Bladder Cancer Neg Hx     Social History   Tobacco Use  . Smoking status: Current Every Day Smoker    Packs/day: 1.00    Years: 44.00    Pack years: 44.00    Types: Cigarettes  . Smokeless tobacco: Never Used  Substance Use Topics  . Alcohol use: No  . Drug use: No    Home Medications Prior to Admission medications   Medication Sig Start Date End Date Taking? Authorizing Provider  ALPRAZolam Duanne Moron) 0.5 MG tablet Take 1 tablet (0.5 mg total) by mouth 2 (two) times daily as needed for anxiety. Patient taking differently: Take 0.5 mg by mouth daily as needed for anxiety.  12/09/16   [name], FNP  aspirin EC 81 MG tablet Take 81 mg by mouth daily.    [provider]  baclofen (LIORESAL) 10 MG tablet Take 1 tablet (10 mg total) by mouth 3 (three) times daily. 08/14/19 02/10/20  Milinda Pointer, MD  bisoprolol-hydrochlorothiazide [facility]) 10-6.25 MG tablet TAKE 1 TABLET BY MOUTH DAILY 06/27/19    Chrismon, Vickki Muff, PA  Cholecalciferol ([facility] D3) 2000 units CAPS Take 2,000 Units by mouth daily.     [provider]  digoxin (LANOXIN) 0.25 MG tablet TAKE 1 TABLET(0.25 MG) BY MOUTH DAILY 08/16/19   Chrismon, Vickki Muff, PA  Fluticasone-Umeclidin-Vilant 100-62.5-25 MCG/INH AEPB Inhale 1 puff into the lungs daily. 10/03/18   [provider]  gabapentin (NEURONTIN) 300 MG capsule Take 1 capsule (300 mg total) by mouth 4 (four) times daily. 08/16/19 02/12/20  Milinda Pointer, MD  HYDROcodone-acetaminophen (NORCO/VICODIN) 5-325 MG tablet Take 1 tablet by mouth every 8 (eight) hours as needed for severe pain. Must last 30 days 10/15/19 11/14/19  Milinda Pointer, MD  HYDROcodone-acetaminophen (NORCO/VICODIN) 5-325 MG tablet Take 1 tablet by mouth every 8 (eight) hours as needed for severe pain. Must last 30 days 11/14/19 12/14/19  Milinda Pointer, MD  HYDROcodone-acetaminophen (NORCO/VICODIN) 5-325 MG tablet Take 1 tablet by mouth every 8 (eight) hours as needed for severe pain. Must last 30 days 12/14/19 01/13/20  Milinda Pointer, MD  ibuprofen (ADVIL,MOTRIN) 200 MG tablet Take 1,200 mg by mouth every 6 (six) hours as needed for headache or moderate pain.    [provider]  ipratropium-albuterol (DUONEB) 0.5-2.5 (3) MG/3ML SOLN USE 1 VIAL VIA NEBULIZER EVERY 6 HOURS AS NEEDED FOR SHORTNESS OF BREATH OR WHEEZING Patient taking differently: Take 3 mLs by nebulization every 4 (four) hours as needed (shortness of breat / wheezing).  11/30/18   Chrismon, Vickki Muff, PA  levothyroxine (SYNTHROID) 25 MCG tablet TAKE 1 TABLET BY MOUTH EVERY DAY BEFORE BREAKFAST 09/24/19   Chrismon, Vickki Muff, PA  potassium chloride SA (KLOR-CON) 20 MEQ tablet TAKE 1 TABLET(20 MEQ) BY MOUTH TWICE DAILY 09/15/19   Darylene Price A, FNP  sertraline (ZOLOFT) 100 MG tablet TAKE 2 TABLETS(200 MG) BY MOUTH EVERY MORNING Patient taking differently: Take 200 mg by mouth daily.  11/16/18   Chrismon, Vickki Muff, PA    torsemide (DEMADEX) 20 MG tablet Take 2 tablets (40 mg total) by mouth 2 (two) times daily. 07/13/19   Bettey Costa, MD  VENTOLIN HFA 108 (90 Base) MCG/ACT inhaler Inhale 2 puffs into the lungs every 6 (six) hours as needed for shortness of breath. 02/10/18   Fritzi Mandes, MD    Allergies    Codeine and Meloxicam  Review of Systems   Review of Systems  All other systems reviewed and are negative.   Physical Exam Updated Vital Signs BP 98/67   Pulse 73   Temp 97.9 F (36.6 C) (Oral)   Resp 18   Ht 1.575 m (_0 )   Wt 113.4 kg   SpO2 100%   BMI 45.73 kg/m   Physical Exam Vitals and nursing note reviewed.  Constitutional:      General: She is not in acute distress.    Appearance: Normal appearance. She is well-developed. She is not toxic-appearing.  HENT:     Head: Normocephalic and atraumatic.  Eyes:     General: Lids are normal.     Conjunctiva/sclera: Conjunctivae normal.     Pupils: Pupils are equal, round, and reactive to light.  Neck:     Thyroid: No thyroid mass.     Trachea: No tracheal deviation.  Cardiovascular:     Rate and Rhythm: Normal rate and regular rhythm.     Heart sounds: Normal heart sounds. No murmur. No gallop.   Pulmonary:     Effort: Pulmonary effort is normal. No respiratory distress.     Breath sounds: Normal breath sounds. No stridor. No decreased breath sounds, wheezing, rhonchi or rales.  Abdominal:     General: Bowel sounds are normal. There is no distension.     Palpations: Abdomen is soft.     Tenderness: There is no abdominal tenderness. There is no rebound.  Musculoskeletal:        General: No tenderness.     Left shoulder: Decreased range of motion.       Arms:     Cervical back: Normal range of motion and neck supple.  Skin:    General: Skin is warm and dry.     Findings: No abrasion or rash.  Neurological:     Mental Status: She is alert and oriented to person, place, and time.     GCS: GCS eye subscore is 4. GCS verbal  subscore is 5. GCS motor subscore is 6.     Cranial Nerves: No cranial nerve deficit or dysarthria.     Sensory: No sensory deficit.     Motor: No weakness, tremor or atrophy.     Comments: Strength is 2 of 5 in left lower extremity.  4\ 5 and left upper extremity  Psychiatric:        Speech: Speech normal.        Behavior: Behavior normal.     ED Results / Procedures / Treatments   Labs (all labs ordered are listed, but only abnormal results are displayed) Labs Reviewed  I-STAT CHEM 8, ED - Abnormal; Notable for the following components:      Result Value   Glucose, Bld 122 (*)    Calcium, Ion 1.05 (*)    TCO2 35 (*)    Hemoglobin 15.3 (*)    All other components within normal limits  SARS CORONAVIRUS 2 (TAT 6-24 HRS)  ETHANOL  PROTIME-INR  APTT  CBC  DIFFERENTIAL  COMPREHENSIVE METABOLIC PANEL  RAPID URINE DRUG SCREEN, HOSP PERFORMED  URINALYSIS, ROUTINE W REFLEX MICROSCOPIC  DIGOXIN LEVEL  CK    EKG EKG Interpretation  Date/Time:  Friday October 11 2019 17:30:32 EST Ventricular Rate:  72 PR Interval:    QRS Duration: 89 QT Interval:  357 QTC Calculation: 391 R Axis:   22 Text Interpretation: Sinus rhythm Borderline repolarization abnormality No significant change since last tracing Confirmed by  Lacretia Leigh ([id number]) on 10/11/2019 5:47:31 PM   Radiology No results found.  Procedures Procedures (including critical care time)  Medications Ordered in ED Medications - No data to display  ED Course  I have reviewed the triage vital signs and the nursing notes.  Pertinent labs & imaging results that were available during my care of the patient were reviewed by me and considered in my medical decision making (see chart for details).    MDM Rules/Calculators/A&P                      Patient was initially difficult to arouse and had episode of transient hypotension which is quickly resolved.  Patient admits to chronic pain in her lower back as well as  chronic left leg issues.  Patient only told me this after I ordered a CT of her head as well as MRI which were negative.  Slightly elevated ammonia level at 40.  X-ray of left shoulder negative for acute fracture.  Patient appears to be intoxicated under some substance.  She states that she has not had any recent doses of her hydrocodone and does take gabapentin.  At baseline patient does use a cane or walker at times due to chronic back pain.  Patient states that her weakness in her left lower leg is unchanged from prior.  Will check ABG.  Will speak with patient's husband will likely need to monitor patient until she is able to walk at her baseline.   10:53 PM Patient awake alert oriented x4 at this time.  Speech is normal.  ABG results reviewed and mildly elevated CO2 but yet patient has known history of COPD.  She has no signs of hypoxemia.  She wants to calm at this time and she will be discharged with her husband  Final Clinical Impression(s) / ED Diagnoses Final diagnoses:  None    Rx / DC Orders ED Discharge Orders    None       Lacretia Leigh, MD 10/11/19 2219    Lacretia Leigh, MD 10/11/19 [phone]

## 2019-10-11 NOTE — ED Notes (Signed)
Pt attempting to call a ride for discharge

## 2019-10-11 NOTE — ED Notes (Signed)
Patient transported to CT 

## 2019-10-14 ENCOUNTER — Other Ambulatory Visit: Payer: Self-pay | Admitting: Family Medicine

## 2019-10-14 NOTE — Telephone Encounter (Signed)
Requested Prescriptions  Pending Prescriptions Disp Refills  . ipratropium-albuterol (DUONEB) 0.5-2.5 (3) MG/3ML SOLN [Pharmacy Med Name: IPRATROPI/ALB 0.5/3MG INH SL 60X3ML] 360 mL 1    Sig: USE 1 VIAL VIA NEBULIZER EVERY 6 HOURS AS NEEDED FOR SHORTNESS OF BREATH OR WHEEZING     Pulmonology:  [employer] Passed - 10/14/2019  3:38 PM      Passed - Valid encounter within last 12 months    Recent Outpatient Visits          2 months ago 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)   [place], [place]   2 months ago Left thigh pain   [place], [place]   3 months ago COPD exacerbation [facility])   [place], [place], [place]   5 months ago Swelling   [employer], [place], [place]   7 months ago Chronic diastolic heart failure [facility])   [employer], Vickki Muff, [place]      Future Appointments            In 1 week [place], Vickki Muff, [place], [place]   In 3 months Milinda Pointer, MD [place]   In 3 months  [employer], [place]

## 2019-10-22 ENCOUNTER — Other Ambulatory Visit: Payer: Self-pay

## 2019-10-22 ENCOUNTER — Telehealth: Payer: Self-pay | Admitting: Family

## 2019-10-22 ENCOUNTER — Telehealth (INDEPENDENT_AMBULATORY_CARE_PROVIDER_SITE_OTHER): Payer: Medicare HMO | Admitting: Adult Health

## 2019-10-22 ENCOUNTER — Emergency Department: Payer: Medicare HMO

## 2019-10-22 ENCOUNTER — Encounter: Payer: Self-pay | Admitting: Adult Health

## 2019-10-22 DIAGNOSIS — I5032 Chronic diastolic (congestive) heart failure: Secondary | ICD-10-CM

## 2019-10-22 DIAGNOSIS — R0902 Hypoxemia: Secondary | ICD-10-CM | POA: Diagnosis not present

## 2019-10-22 DIAGNOSIS — J441 Chronic obstructive pulmonary disease with (acute) exacerbation: Secondary | ICD-10-CM

## 2019-10-22 DIAGNOSIS — Z7982 Long term (current) use of aspirin: Secondary | ICD-10-CM | POA: Diagnosis not present

## 2019-10-22 DIAGNOSIS — I110 Hypertensive heart disease with heart failure: Secondary | ICD-10-CM | POA: Diagnosis not present

## 2019-10-22 DIAGNOSIS — I509 Heart failure, unspecified: Secondary | ICD-10-CM | POA: Insufficient documentation

## 2019-10-22 DIAGNOSIS — J40 Bronchitis, not specified as acute or chronic: Secondary | ICD-10-CM | POA: Diagnosis not present

## 2019-10-22 DIAGNOSIS — Z20822 Contact with and (suspected) exposure to covid-19: Secondary | ICD-10-CM

## 2019-10-22 DIAGNOSIS — R0602 Shortness of breath: Secondary | ICD-10-CM

## 2019-10-22 DIAGNOSIS — J9621 Acute and chronic respiratory failure with hypoxia: Secondary | ICD-10-CM

## 2019-10-22 DIAGNOSIS — Z87898 Personal history of other specified conditions: Secondary | ICD-10-CM | POA: Diagnosis not present

## 2019-10-22 DIAGNOSIS — F17210 Nicotine dependence, cigarettes, uncomplicated: Secondary | ICD-10-CM | POA: Diagnosis not present

## 2019-10-22 DIAGNOSIS — Z79899 Other long term (current) drug therapy: Secondary | ICD-10-CM | POA: Insufficient documentation

## 2019-10-22 DIAGNOSIS — R079 Chest pain, unspecified: Secondary | ICD-10-CM | POA: Diagnosis not present

## 2019-10-22 LAB — COMPREHENSIVE METABOLIC PANEL
ALT: 24 U/L (ref 0–44)
AST: 25 U/L (ref 15–41)
Albumin: 3.8 g/dL (ref 3.5–5.0)
Alkaline Phosphatase: 131 U/L — ABNORMAL HIGH (ref 38–126)
Anion gap: 12 (ref 5–15)
BUN: 32 mg/dL — ABNORMAL HIGH (ref 8–23)
CO2: 33 mmol/L — ABNORMAL HIGH (ref 22–32)
Calcium: 9.1 mg/dL (ref 8.9–10.3)
Chloride: 95 mmol/L — ABNORMAL LOW (ref 98–111)
Creatinine, Ser: 1.47 mg/dL — ABNORMAL HIGH (ref 0.44–1.00)
GFR calc Af Amer: 44 mL/min — ABNORMAL LOW (ref >60)
GFR calc non Af Amer: 38 mL/min — ABNORMAL LOW (ref >60)
Glucose, Bld: 127 mg/dL — ABNORMAL HIGH (ref 70–99)
Potassium: 3.5 mmol/L (ref 3.5–5.1)
Sodium: 140 mmol/L (ref 135–145)
Total Bilirubin: 0.7 mg/dL (ref 0.3–1.2)
Total Protein: 7.4 g/dL (ref 6.5–8.1)

## 2019-10-22 LAB — CBC
HCT: 44.7 % (ref 36.0–46.0)
Hemoglobin: 14 g/dL (ref 12.0–15.0)
MCH: 31.1 pg (ref 26.0–34.0)
MCHC: 31.3 g/dL (ref 30.0–36.0)
MCV: 99.3 fL (ref 80.0–100.0)
Platelets: 183 10*3/uL (ref 150–400)
RBC: 4.5 MIL/uL (ref 3.87–5.11)
RDW: 17.2 % — ABNORMAL HIGH (ref 11.5–15.5)
WBC: 13.6 10*3/uL — ABNORMAL HIGH (ref 4.0–10.5)
nRBC: 0 % (ref 0.0–0.2)

## 2019-10-22 LAB — BRAIN NATRIURETIC PEPTIDE: B Natriuretic Peptide: 221 pg/mL — ABNORMAL HIGH (ref 0.0–100.0)

## 2019-10-22 LAB — TROPONIN I (HIGH SENSITIVITY): Troponin I (High Sensitivity): 13 ng/L (ref <18)

## 2019-10-22 NOTE — Telephone Encounter (Signed)
Virtual Visit Pre-Appointment Phone Call  Ms Mazor,  I am calling you today to discuss your upcoming appointment. We are currently trying to limit exposure to the virus that causes COVID-19 by seeing patients at home rather than in the office."  1. "What is the BEST phone number to call the day of the visit?" - include this in appointment notes  2. "Do you have or have access to (through a family member/friend) a smartphone with video capability that we can use for your visit?" a. If yes - list this number in appt notes as "cell" (if different from BEST phone #) and list the appointment type as a VIDEO visit in appointment notes b. If no - list the appointment type as a PHONE visit in appointment notes  3. Confirm consent - "In the setting of the current Covid19 crisis, you are scheduled for a (phone or video) visit with your provider on (date) at (time).  Just as we do with many in-office visits, in order for you to participate in this visit, we must obtain consent.  If you'd like, I can send this to your mychart (if signed up) or email for you to review.  Otherwise, I can obtain your verbal consent now.  All virtual visits are billed to your insurance company just like a normal visit would be.  By agreeing to a virtual visit, we'd like you to understand that the technology does not allow for your provider to perform an examination, and thus may limit your provider's ability to fully assess your condition. If your provider identifies any concerns that need to be evaluated in person, we will make arrangements to do so.  Finally, though the technology is pretty good, we cannot assure that it will always work on either your or our end, and in the setting of a video visit, we may have to convert it to a phone-only visit.  In either situation, we cannot ensure that we have a secure connection.  Are you willing to proceed?" STAFF: Did the patient verbally acknowledge consent to telehealth visit? Document  YES/NO here: YES  4. Advise patient to be prepared - "Two hours prior to your appointment, go ahead and check your blood pressure, pulse, oxygen saturation, and your weight (if you have the equipment to check those) and write them all down. When your visit starts, your provider will ask you for this information. If you have an Apple Watch or Kardia device, please plan to have heart rate information ready on the day of your appointment. Please have a pen and paper handy nearby the day of the visit as well."  5. Give patient instructions for MyChart download to smartphone OR [email or web address] as below if video visit (depending on what platform provider is using)  6. Inform patient they will receive a phone call 15 minutes prior to their appointment time (may be from unknown caller ID) so they should be prepared to answer    TELEPHONE CALL NOTE  Torrie Namba has been deemed a candidate for a follow-up tele-health visit to limit community exposure during the Covid-19 pandemic. I spoke with the patient via phone to ensure availability of phone/video source, confirm preferred email & phone number, and discuss instructions and expectations.  I reminded Delphia Kaylor to be prepared with any vital sign and/or heart rhythm information that could potentially be obtained via home monitoring, at the time of her visit. I reminded Layann Bluett to expect a phone call prior to her visit.  [name], [place] 10/22/2019 2:31 PM   INSTRUCTIONS FOR DOWNLOADING THE MYCHART APP TO SMARTPHONE  - The patient must first make sure to have activated MyChart and know their login information - If Apple, go to [employer] and type in MyChart in the search bar and download the app. If Android, ask patient to go to [employer] and type in [place] in the search bar and download the app. The app is free but as with any other app downloads, their phone may require them to verify saved payment information or Apple/Android  password.  - The patient will need to then log into the app with their MyChart username and password, and select [facility] as their healthcare provider to link the account. When it is time for your visit, go to the MyChart app, find appointments, and click Begin Video Visit. Be sure to Select Allow for your device to access the Microphone and Camera for your visit. You will then be connected, and your provider will be with you shortly.  **If they have any issues connecting, or need assistance please contact MyChart service desk [phone])**  **If using a computer, in order to ensure the best quality for their visit they will need to use either of the following Internet Browsers: [employer], or Google Chrome**  IF USING DOXIMITY or DOXY.ME - The patient will receive a link just prior to their visit by text.     FULL LENGTH CONSENT FOR TELE-HEALTH VISIT   I hereby voluntarily request, consent and authorize [facility] and its employed or contracted physicians, physician assistants, nurse practitioners or other licensed health care professionals (the Practitioner), to provide me with telemedicine health care services (the "Services") as deemed necessary by the treating Practitioner. I acknowledge and consent to receive the Services by the Practitioner via telemedicine. I understand that the telemedicine visit will involve communicating with the Practitioner through live audiovisual communication technology and the disclosure of certain medical information by electronic transmission. I acknowledge that I have been given the opportunity to request an in-person assessment or other available alternative prior to the telemedicine visit and am voluntarily participating in the telemedicine visit.  I understand that I have the right to withhold or withdraw my consent to the use of telemedicine in the course of my care at any time, without affecting my right to future care or treatment, and  that the Practitioner or I may terminate the telemedicine visit at any time. I understand that I have the right to inspect all information obtained and/or recorded in the course of the telemedicine visit and may receive copies of available information for a reasonable fee.  I understand that some of the potential risks of receiving the Services via telemedicine include:  Marland Kitchen Delay or interruption in medical evaluation due to technological equipment failure or disruption; . Information transmitted may not be sufficient (e.g. poor resolution of images) to allow for appropriate medical decision making by the Practitioner; and/or  . In rare instances, security protocols could fail, causing a breach of personal health information.  Furthermore, I acknowledge that it is my responsibility to provide information about my medical history, conditions and care that is complete and accurate to the best of my ability. I acknowledge that Practitioner's advice, recommendations, and/or decision may be based on factors not within their control, such as incomplete or inaccurate data provided by me or distortions of diagnostic images or specimens that may result from electronic transmissions. I understand that  the practice of medicine is not an exact science and that Practitioner makes no warranties or guarantees regarding treatment outcomes. I acknowledge that I will receive a copy of this consent concurrently upon execution via email to the email address I last provided but may also request a printed copy by calling the office of [facility].    I understand that my insurance will be billed for this visit.   I have read or had this consent read to me. . I understand the contents of this consent, which adequately explains the benefits and risks of the Services being provided via telemedicine.  . I have been provided ample opportunity to ask questions regarding this consent and the Services and have had my  questions answered to my satisfaction. . I give my informed consent for the services to be provided through the use of telemedicine in my medical care  By participating in this telemedicine visit I agree to the above.

## 2019-10-22 NOTE — Progress Notes (Signed)
Patient: Theresa [name] Female    DOB: August 30, 1956   64 y.o.   MRN: [id number] Visit Date: 10/22/2019  Today's Provider: Marcille Buffy, FNP   No chief complaint on file.  Subjective:    Virtual Visit via Video Note  I connected with Theresa [name] on 10/22/19 at  4:00 PM EST by a video enabled telemedicine application and verified that I am speaking with the correct person using two identifiers.  Location: Patient: at  Home  Provider: Provider: Provider's office at  [facility], [place].      I discussed the limitations of evaluation and management by telemedicine and the availability of in person appointments. The patient expressed understanding and agreed to proceed.   I discussed the assessment and treatment plan with the patient. The patient was provided an opportunity to ask questions and all were answered. The patient agreed with the plan and demonstrated an understanding of the instructions.   The patient was advised to call back or seek an in-person evaluation if the symptoms worsen or if the condition fails to improve as anticipated.\   Cough This is a new problem. The current episode started 1 to 4 weeks ago. The problem has been gradually worsening. The cough is productive of purulent sputum. Associated symptoms include chest pain, ear congestion, headaches, heartburn, myalgias, nasal congestion, postnasal drip, rhinorrhea, shortness of breath and wheezing. Pertinent negatives include no chills, ear pain, fever, hemoptysis, rash, sore throat, sweats or weight loss. Nothing aggravates the symptoms. She has tried steroid inhaler (Prilosec, otc cough drops) for the symptoms. The treatment provided no relief. Her past medical history is significant for COPD.   Onset cough 10/08/19.  She was seen on 2019-10-21 at ER, she had passed out that day. Her husband found her. MRI was normal. X ray of left shoulder- negative for fracture. She had  decreased responsiveness that day.  She reports she did have chest pain that morning. She was told she had fluid on her left lung when they listened to her in the emergency room on 2019-10-21 she did not have an x ray that provider can see. Covid test was negative.  Onset: Cough - a few days before Christmas.  She has had to turn oxygen up to 3 L today, oxygen saturation   Weight 247 lbs. 99/62. Temperature 98.  Heart rate 66. Oxygen saturation was 94 on 3 L she usually wears 2 L oxygen via nasal canula however she reports she turned it up this morning sue to shortness of breath. . Without oxygen she desaturates  to 72 percent at rest and this is not normal for her.   She has been having chest pains since before Christmas she reports.  she has taken doxycycline she had at home she felt as if her cough was gettimg better. She took for 5 days and yellow sputum cleared up. She reports she continues to have increasing shortness of breath and chest pain.   Denies edema, calf pain.  Denies any recent surgeries.     Allergies  Allergen Reactions  . Codeine Nausea And Vomiting  . Meloxicam Other (See Comments)    Stomach pain     Current Outpatient Medications:  .  ALPRAZolam (XANAX) 0.5 MG tablet, Take 1 tablet (0.5 mg total) by mouth 2 (two) times daily as needed for anxiety. (Patient taking differently: Take 0.5 mg by mouth daily as needed for anxiety. ), Disp: 60 tablet, Rfl: 0 .  aspirin EC 81 MG tablet, Take 81 mg by mouth daily., Disp: , Rfl:  .  baclofen (LIORESAL) 10 MG tablet, Take 1 tablet (10 mg total) by mouth 3 (three) times daily., Disp: 90 tablet, Rfl: 5 .  bisoprolol-hydrochlorothiazide (ZIAC) 10-6.25 MG tablet, TAKE 1 TABLET BY MOUTH DAILY, Disp: 90 tablet, Rfl: 3 .  Cholecalciferol (CVS D3) 2000 units CAPS, Take 2,000 Units by mouth daily. , Disp: , Rfl:  .  digoxin (LANOXIN) 0.25 MG tablet, TAKE 1 TABLET(0.25 MG) BY MOUTH DAILY, Disp: 90 tablet, Rfl: 0 .   Fluticasone-Umeclidin-Vilant 100-62.5-25 MCG/INH AEPB, Inhale 1 puff into the lungs daily., Disp: , Rfl:  .  gabapentin (NEURONTIN) 300 MG capsule, Take 1 capsule (300 mg total) by mouth 4 (four) times daily., Disp: 120 capsule, Rfl: 5 .  HYDROcodone-acetaminophen (NORCO/VICODIN) 5-325 MG tablet, Take 1 tablet by mouth every 8 (eight) hours as needed for severe pain. Must last 30 days, Disp: 90 tablet, Rfl: 0 .  [START ON 11/14/2019] HYDROcodone-acetaminophen (NORCO/VICODIN) 5-325 MG tablet, Take 1 tablet by mouth every 8 (eight) hours as needed for severe pain. Must last 30 days, Disp: 90 tablet, Rfl: 0 .  [START ON 12/14/2019] HYDROcodone-acetaminophen (NORCO/VICODIN) 5-325 MG tablet, Take 1 tablet by mouth every 8 (eight) hours as needed for severe pain. Must last 30 days, Disp: 90 tablet, Rfl: 0 .  ibuprofen (ADVIL,MOTRIN) 200 MG tablet, Take 1,200 mg by mouth every 6 (six) hours as needed for headache or moderate pain., Disp: , Rfl:  .  ipratropium-albuterol (DUONEB) 0.5-2.5 (3) MG/3ML SOLN, USE 1 VIAL VIA NEBULIZER EVERY 6 HOURS AS NEEDED FOR SHORTNESS OF BREATH OR WHEEZING, Disp: 360 mL, Rfl: 1 .  levothyroxine (SYNTHROID) 25 MCG tablet, TAKE 1 TABLET BY MOUTH EVERY DAY BEFORE BREAKFAST, Disp: 30 tablet, Rfl: 3 .  potassium chloride SA (KLOR-CON) 20 MEQ tablet, TAKE 1 TABLET(20 MEQ) BY MOUTH TWICE DAILY, Disp: 180 tablet, Rfl: 3 .  sertraline (ZOLOFT) 100 MG tablet, TAKE 2 TABLETS(200 MG) BY MOUTH EVERY MORNING (Patient taking differently: Take 200 mg by mouth daily. ), Disp: 180 tablet, Rfl: 3 .  torsemide (DEMADEX) 20 MG tablet, Take 2 tablets (40 mg total) by mouth 2 (two) times daily., Disp: , Rfl:  .  VENTOLIN HFA 108 (90 Base) MCG/ACT inhaler, Inhale 2 puffs into the lungs every 6 (six) hours as needed for shortness of breath., Disp: 1 Inhaler, Rfl: 5  Review of Systems  Constitutional: Negative for chills, fever and weight loss.  HENT: Positive for postnasal drip and rhinorrhea. Negative  for ear pain and sore throat.   Respiratory: Positive for cough, shortness of breath and wheezing. Negative for hemoptysis.   Cardiovascular: Positive for chest pain.  Gastrointestinal: Positive for heartburn.  Musculoskeletal: Positive for myalgias.  Skin: Negative for rash.  Neurological: Positive for headaches.    Social History   Tobacco Use  . Smoking status: Current Every Day Smoker    Packs/day: 1.00    Years: 44.00    Pack years: 44.00    Types: Cigarettes  . Smokeless tobacco: Never Used  Substance Use Topics  . Alcohol use: No      Objective:   There were no vitals taken for this visit. There were no vitals filed for this visit.There is no height or weight on file to calculate BMI.   Physical Exam   Patient is alert and oriented and responsive to questions Engages in conversation with provider. Speaks a couple of words at a time  pausing to catch her breath. Wearing nasal canula at reported 3 Liters on video.   She is dyspneic on video intermittently and appears ill. Appears to be using accessory muscles with breathing occasionally.    No results found for any visits on 10/22/19.     Assessment & Plan    SOB (shortness of breath)  Chronic diastolic heart failure (HCC)  Acute on chronic respiratory failure with hypoxia (HCC)  COPD exacerbation (HCC)  History of syncope-10/11/2019  Suspected COVID-19 virus infection  Oxygen desaturation  Advised face to face visit in the emergency room now, due to Covid pandemic no in person office visit per office protocol is available,  she is going to go to [facility] and will have her husband drive her now. 911 for emergencies. Follow up with office after hand on evaluation in the emergency room.   The entirety of the information documented in the History of Present Illness, Review of Systems and Physical Exam were personally obtained by me. Portions of this information were initially documented by the   Certified Medical Assistant whose name is documented in [place] and reviewed by me for thoroughness and accuracy.  I have personally performed the exam and reviewed the chart and it is accurate to the best of my knowledge.  Haematologist has been used and any errors in dictation or transcription are unintentional.  Kelby Aline. Flinchum FNP-C  [facility]    I provided 15 minutes of non-face-to-face time during this encounter. Marcille Buffy, [facility]

## 2019-10-22 NOTE — ED Triage Notes (Signed)
Pt was seen at [facility] 12/25 due to fall and was told she had "fluid in her left lung". Pt is still co shob and chest tightness. Pt is on home o2 due to copd. Is normally on 2.5l and has increased it up to 3l. Pt is on diuretics at home for CHF.

## 2019-10-23 ENCOUNTER — Encounter: Payer: Self-pay | Admitting: Family

## 2019-10-23 ENCOUNTER — Ambulatory Visit: Payer: Medicare HMO | Admitting: Family

## 2019-10-23 ENCOUNTER — Emergency Department
Admission: EM | Admit: 2019-10-23 | Discharge: 2019-10-23 | Disposition: A | Discharge status: Home / Self Care | Payer: Medicare HMO | Attending: Emergency Medicine | Admitting: Emergency Medicine

## 2019-10-23 VITALS — BP 99/62 | Temp 98.0°F | Wt 249.0 lb

## 2019-10-23 DIAGNOSIS — J410 Simple chronic bronchitis: Secondary | ICD-10-CM

## 2019-10-23 DIAGNOSIS — J9610 Chronic respiratory failure, unspecified whether with hypoxia or hypercapnia: Secondary | ICD-10-CM | POA: Diagnosis not present

## 2019-10-23 DIAGNOSIS — I10 Essential (primary) hypertension: Secondary | ICD-10-CM

## 2019-10-23 DIAGNOSIS — J40 Bronchitis, not specified as acute or chronic: Secondary | ICD-10-CM

## 2019-10-23 DIAGNOSIS — R29898 Other symptoms and signs involving the musculoskeletal system: Secondary | ICD-10-CM

## 2019-10-23 DIAGNOSIS — Z720 Tobacco use: Secondary | ICD-10-CM

## 2019-10-23 DIAGNOSIS — I5032 Chronic diastolic (congestive) heart failure: Secondary | ICD-10-CM

## 2019-10-23 LAB — TROPONIN I (HIGH SENSITIVITY): Troponin I (High Sensitivity): 12 ng/L (ref <18)

## 2019-10-23 MED ORDER — PREDNISONE 20 MG PO TABS: 60.0000 mg | ORAL_TABLET | Freq: Every day | ORAL | 0 refills | Status: AC

## 2019-10-23 MED ORDER — ALBUTEROL SULFATE HFA 108 (90 BASE) MCG/ACT IN AERS: 2.0000 | INHALATION_SPRAY | Freq: Four times a day (QID) | RESPIRATORY_TRACT | 1 refills | Status: AC | PRN

## 2019-10-23 MED ORDER — DOXYCYCLINE HYCLATE 100 MG PO TABS
100.0000 mg | ORAL_TABLET | Freq: Once | ORAL | Status: AC
  Administered 2019-10-23: 100 mg via ORAL
  Filled 2019-10-23: qty 1

## 2019-10-23 MED ORDER — DOXYCYCLINE HYCLATE 100 MG PO CAPS: 100.0000 mg | ORAL_CAPSULE | Freq: Two times a day (BID) | ORAL | 0 refills | Status: AC

## 2019-10-23 MED ORDER — SODIUM CHLORIDE 0.9 % IV BOLUS: 1000.0000 mL | Freq: Once | INTRAVENOUS | Status: DC

## 2019-10-23 MED ORDER — IPRATROPIUM-ALBUTEROL 0.5-2.5 (3) MG/3ML IN SOLN
3.0000 mL | Freq: Once | RESPIRATORY_TRACT | Status: AC
  Administered 2019-10-23: 3 mL via RESPIRATORY_TRACT
  Filled 2019-10-23: qty 3

## 2019-10-23 MED ORDER — IPRATROPIUM-ALBUTEROL 0.5-2.5 (3) MG/3ML IN SOLN
3.0000 mL | Freq: Once | RESPIRATORY_TRACT | Status: AC
  Administered 2019-10-23: 04:00:00 3 mL via RESPIRATORY_TRACT
  Filled 2019-10-23: qty 3

## 2019-10-23 NOTE — ED Notes (Signed)
Pt assisted into hosp gown & on card monitor; O2 in place at 3l/min via [facility] (reports on 2.5l cont at home); st since Christmas has had prod cough yellow sputum and SHOB; +smoker with hx COPD

## 2019-10-23 NOTE — Patient Instructions (Signed)
Continue weighing daily and call for an overnight weight gain of > 2 pounds or a weekly weight gain of >5 pounds. 

## 2019-10-23 NOTE — Progress Notes (Signed)
Virtual Visit via Telephone Note   Evaluation Performed:  Follow-up visit  This visit type was conducted due to national recommendations for restrictions regarding the COVID-19 Pandemic (e.g. social distancing).  This format is felt to be most appropriate for this patient at this time.  All issues noted in this document were discussed and addressed.  No physical exam was performed (except for noted visual exam findings with Video Visits).  Please refer to the patient's chart (MyChart message for video visits and phone note for telephone visits) for the patient's consent to telehealth for [facility]  Date:  10/23/2019   ID:  Theresa [name], DOB 12-13-1955, MRN [id number]  Patient Location:  [address] [place] [address]   Provider location:   [facility] [address], [place] [address]  PCP:  [place], [place]  Cardiologist: Lujean Amel, MD Electrophysiologist:  None   Chief Complaint:  Shortness of breath  History of Present Illness:    Theresa [name] is a 64 y.o. female who presents via audio/video conferencing for a telehealth visit today.  Patient verified DOB and address.  The patient does not have symptoms concerning for COVID-19 infection (fever, chills, cough, or new SHORTNESS OF BREATH).   Patient reports minimal shortness of breath upon moderate exertion. She describes this as chronic in nature having been present for several years. She has associated difficulty sleeping, cough, fatigue, wheezing, leg weakness, bruising and back pain along with this. She has intermittent swelling in her legs. She denies any palpitations, chest pain or weight gain.   She is going to start doxycycline and prednisone later today once it gets picked up from the pharmacy as she was just in the ED earlier this morning/ last last night and diagnosed with bronchitis.   Prior CV studies:   The following studies were reviewed  today:  Echo report from 07/07/2019 reviewed and showed an EF of 60-65% along with trace MR, AR, PR, mild TR and moderately increased PA pressure.  Catheterization done 07/07/2019 and showed moderate pulmonary HTN without significant CAD  Past Medical History:  Diagnosis Date  . Acute postoperative pain 10/03/2017  . Anxiety   . BiPAP (biphasic positive airway pressure) dependence    at hs  . Carpal tunnel syndrome   . CHF (congestive heart failure) ([place])    1/18  . CHF (congestive heart failure) ([place])   . Chronic generalized abdominal pain   . Chronic rhinitis   . COPD (chronic obstructive pulmonary disease) ([place])   . DDD (degenerative disc disease), cervical   . DDD (degenerative disc disease), lumbosacral   . Depression   . Dyspnea   . Edema   . Flu    1/18  . Gastritis   . GERD (gastroesophageal reflux disease)   . Hematuria   . Hernia of abdominal wall 07/17/2018  . Hernia, abdominal   . Hypertension   . Kidney stones   . Low back pain   . Lumbar radiculitis   . Malodorous urine   . Muscle weakness   . Obesity   . Oxygen dependent   . Renal cyst   . Sensory urge incontinence   . Thyroid activity decreased    9/19  . Tobacco abuse   . Wheezing    Past Surgical History:  Procedure Laterality Date  . ABDOMINAL HYSTERECTOMY    . CARPAL TUNNEL RELEASE Left 2012  . EPIGASTRIC HERNIA REPAIR N/A 08/13/2018  HERNIA REPAIR EPIGASTRIC ADULT; polypropylene mesh reinforcement.  Surgeon: Robert Bellow, MD;  Location: [facility] ORS;  Service: General;  Laterality: N/A;  . RIGHT/LEFT HEART CATH AND CORONARY ANGIOGRAPHY N/A 07/11/2019   Procedure: RIGHT/LEFT HEART CATH AND CORONARY ANGIOGRAPHY;  Surgeon: Yolonda Kida, MD;  Location: [place] [facility];  Service: Cardiovascular;  Laterality: N/A;  . TONSILLECTOMY    . TUBAL LIGATION    . UMBILICAL HERNIA REPAIR N/A 08/13/2018   HERNIA REPAIR UMBILICAL ADULT; polypropylene mesh reinforcement Surgeon: Robert Bellow, MD;  Location: [facility] ORS;  Service: General;  Laterality: N/A;       Prior to Admission medications   Medication Sig Start Date End Date Taking? Authorizing Provider  albuterol (VENTOLIN HFA) 108 (90 Base) MCG/ACT inhaler Inhale 2 puffs into the lungs every 6 (six) hours as needed for wheezing or shortness of breath. 10/23/19  Yes Alfred Levins, [place], MD  ALPRAZolam Duanne Moron) 0.5 MG tablet Take 1 tablet (0.5 mg total) by mouth 2 (two) times daily as needed for anxiety. Patient taking differently: Take 0.5 mg by mouth daily as needed for anxiety.  12/09/16  Yes Darylene Price A, FNP  aspirin EC 81 MG tablet Take 81 mg by mouth daily.   Yes [provider]  baclofen (LIORESAL) 10 MG tablet Take 1 tablet (10 mg total) by mouth 3 (three) times daily. 08/14/19 02/10/20 Yes Milinda Pointer, MD  bisoprolol-hydrochlorothiazide [facility]) 10-6.25 MG tablet TAKE 1 TABLET BY MOUTH DAILY 06/27/19  Yes Chrismon, Vickki Muff, PA  Cholecalciferol (CVS D3) 2000 units CAPS Take 2,000 Units by mouth daily.    Yes [provider]  digoxin (LANOXIN) 0.25 MG tablet TAKE 1 TABLET(0.25 MG) BY MOUTH DAILY 08/16/19  Yes Chrismon, Vickki Muff, PA  doxycycline (VIBRAMYCIN) 100 MG capsule Take 1 capsule (100 mg total) by mouth 2 (two) times daily for 5 days. 10/23/19 10/28/19 Yes Alfred Levins, [place], MD  Fluticasone-Umeclidin-Vilant 100-62.5-25 MCG/INH AEPB Inhale 1 puff into the lungs daily. 10/03/18  Yes [provider]  gabapentin (NEURONTIN) 300 MG capsule Take 1 capsule (300 mg total) by mouth 4 (four) times daily. 08/16/19 02/12/20 Yes Milinda Pointer, MD  HYDROcodone-acetaminophen (NORCO/VICODIN) 5-325 MG tablet Take 1 tablet by mouth every 8 (eight) hours as needed for severe pain. Must last 30 days 10/15/19 11/14/19 Yes Milinda Pointer, MD  ipratropium-albuterol (DUONEB) 0.5-2.5 (3) MG/3ML SOLN USE 1 VIAL VIA NEBULIZER EVERY 6 HOURS AS NEEDED FOR SHORTNESS OF BREATH OR WHEEZING 10/14/19  Yes Chrismon,  Vickki Muff, PA  levothyroxine (SYNTHROID) 25 MCG tablet TAKE 1 TABLET BY MOUTH EVERY DAY BEFORE BREAKFAST 09/24/19  Yes Chrismon, Vickki Muff, PA  potassium chloride SA (KLOR-CON) 20 MEQ tablet TAKE 1 TABLET(20 MEQ) BY MOUTH TWICE DAILY 09/15/19  Yes Darylene Price A, FNP  predniSONE (DELTASONE) 20 MG tablet Take 3 tablets (60 mg total) by mouth daily for 4 days. 10/23/19 10/27/19 Yes Veronese, [place], MD  sertraline (ZOLOFT) 100 MG tablet TAKE 2 TABLETS(200 MG) BY MOUTH EVERY MORNING Patient taking differently: Take 200 mg by mouth daily.  11/16/18  Yes Chrismon, Vickki Muff, PA  torsemide (DEMADEX) 20 MG tablet Take 2 tablets (40 mg total) by mouth 2 (two) times daily. 07/13/19  Yes Bettey Costa, MD  HYDROcodone-acetaminophen (NORCO/VICODIN) 5-325 MG tablet Take 1 tablet by mouth every 8 (eight) hours as needed for severe pain. Must last 30 days 11/14/19 12/14/19  Milinda Pointer, MD  HYDROcodone-acetaminophen (NORCO/VICODIN) 5-325 MG tablet Take 1 tablet by mouth every 8 (eight) hours  as needed for severe pain. Must last 30 days 12/14/19 01/13/20  Milinda Pointer, MD  ibuprofen (ADVIL,MOTRIN) 200 MG tablet Take 1,200 mg by mouth every 6 (six) hours as needed for headache or moderate pain.    [provider]    Allergies:   Codeine and Meloxicam   Social History   Tobacco Use  . Smoking status: Current Every Day Smoker    Packs/day: 1.00    Years: 44.00    Pack years: 44.00    Types: Cigarettes  . Smokeless tobacco: Never Used  Substance Use Topics  . Alcohol use: No  . Drug use: No     Family Hx: The patient's family history includes Alcohol abuse in her father; Breast cancer in her sister; Cancer in her brother, brother, and sister; Coronary artery disease in her mother; Heart disease in her father and mother; Lung cancer in her sister; Pneumonia in her brother; Stroke in her mother. There is no history of Prostate cancer, Kidney cancer, or Bladder Cancer.  ROS:   Please see the  history of present illness.     All other systems reviewed and are negative.   Labs/Other Tests and Data Reviewed:    Recent Labs: 11/16/2018: TSH 0.805 07/06/2019: Magnesium 2.1 10/22/2019: ALT 24; B Natriuretic Peptide 221.0; BUN 32; Creatinine, Ser 1.47; Hemoglobin 14.0; Platelets 183; Potassium 3.5; Sodium 140   Recent Lipid Panel No results found for: CHOL, TRIG, HDL, CHOLHDL, LDLCALC, LDLDIRECT  Wt Readings from Last 3 Encounters:  10/22/19 247 lb (112 kg)  10/11/19 250 lb (113.4 kg)  09/10/19 240 lb (108.9 kg)     Exam:    Vital Signs:  There were no vitals taken for this visit.   Well nourished, well developed female in no  acute distress.   ASSESSMENT & PLAN:    1. Chronic heart failure with preserved ejection fraction- - NYHA class II - euvolemic per patient's description - continues to weigh daily at home. Reminded to call for an overnight weight gain of >2 pounds or a weekly weight gain of >5 pounds - says that her home weight ranges from 245-248 pounds - monitor sodium content carefully - saw cardiologist Clayborn Bigness) 07/25/2019  - BNP 07/09/2019 was 108.0  2: HTN- - self-reported BP from the ED was on the low side - had virtual visit with PCP 10/22/19 - BMP from 10/22/19 reviewed and showed sodium 140, potassium 3.5, creatinine 1.47 and GFR 38  3: COPD- - wearing bipap nightly - oxygen at 3L right now at bedtime and then as needed during the day - last saw pulmonology Raul Del) on 07/29/2019 - will be starting doxycycline and prednisone later today  4: Tobacco use- - says that she's smoking "the same, no more and no less" - removes herself from the oxygen when she smokes - complete cessation discussed for 3 minutes with her  5: Leg weakness- - has had numerous falls due to "legs buckling" resulting in falls - saw neurosurgery 09/26/2019 - continues with pain management clinic  COVID-19 Education: The signs and symptoms of COVID-19 were discussed with the  patient and how to seek care for testing (follow up with PCP or arrange E-visit).  The importance of social distancing was discussed today.  Patient Risk:   After full review of this patients clinical status, I feel that they are at least moderate risk at this time.  Time:   Today, I have spent 26 minutes with the patient with telehealth technology discussing medications,  symptoms and weight     Medication Adjustments/Labs and Tests Ordered: Current medicines are reviewed at length with the patient today.  Concerns regarding medicines are outlined above.   Tests Ordered: No orders of the defined types were placed in this encounter.  Medication Changes: No orders of the defined types were placed in this encounter.   Disposition:  Follow-up in 4 months or sooner for any questions/problems before then.   Signed, [name], FNP  10/23/2019 11:27 AM    [facility]

## 2019-10-23 NOTE — ED Provider Notes (Signed)
[facility] Emergency Department Provider Note  ____________________________________________  Time seen: Approximately 3:53 AM  I have reviewed the triage vital signs and the nursing notes.   HISTORY  Chief Complaint Shortness of Breath   HPI Ludwika Rodd is a 64 y.o. female with history of obstructive sleep apnea on BiPAP, COPD on 2.5 L nasal cannula, HFpEF, hypertension, hyperlipidemia who presents for evaluation of shortness of breath.  Patient had a fall on December 25 and ended up at [facility].  She was told that the x-ray of her shoulder showed some fluid in her lungs.  She has been trying to get a follow-up appointment for a chest x-ray with her primary care doctor however has been unable.  Today she was able to get a virtual visit with the PA.  She told the PA that over the last 3 days she has been having wheezing, changes in her chronic cough which is now productive of yellow phlegm, mild worsening shortness of breath mostly with ambulation and intermittent pulling like chest pain.  The PA said the patient needed a chest x-ray and told her to come to the hospital to get one.  Patient was not sure where to go and ended up in the emergency room.  She has been sitting in the waiting room for almost 6 hours to be seen.  She denies any chest pain at this time.   Past Medical History:  Diagnosis Date  . Acute postoperative pain 10/03/2017  . Anxiety   . BiPAP (biphasic positive airway pressure) dependence    at hs  . Carpal tunnel syndrome   . CHF (congestive heart failure) ([place])    1/18  . CHF (congestive heart failure) ([place])   . Chronic generalized abdominal pain   . Chronic rhinitis   . COPD (chronic obstructive pulmonary disease) ([place])   . DDD (degenerative disc disease), cervical   . DDD (degenerative disc disease), lumbosacral   . Depression   . Dyspnea   . Edema   . Flu    1/18  . Gastritis   . GERD (gastroesophageal reflux disease)   .  Hematuria   . Hernia of abdominal wall 07/17/2018  . Hernia, abdominal   . Hypertension   . Kidney stones   . Low back pain   . Lumbar radiculitis   . Malodorous urine   . Muscle weakness   . Obesity   . Oxygen dependent   . Renal cyst   . Sensory urge incontinence   . Thyroid activity decreased    9/19  . Tobacco abuse   . Wheezing     Patient Active Problem List   Diagnosis Date Noted  . Protruded lumbar disc (L2-3) (Left) 09/10/2019  . Chronic knee pain (Left) 08/14/2019  . Chronic lumbar radiculopathy/radiculitis (L2) (Left) 08/14/2019  . Abnormal MRI, lumbar spine (07/23/2019) 08/14/2019  . Acute on chronic respiratory failure with hypoxia ([place]) 07/06/2019  . Cervicalgia (Bilateral) (R>L) 09/24/2018  . Spondylosis without myelopathy or radiculopathy, lumbosacral region 09/24/2018  . Epigastric hernia 05/04/2018  . Chronic lower extremity pain (Bilateral) (L>R) 01/25/2018  . Altered mental status, unspecified 12/09/2017  . Chronic hip pain [facility]) (Bilateral) (L>R) 11/27/2017  . COPD (chronic obstructive pulmonary disease) ([place]) 08/11/2017  . Other long term (current) drug therapy 08/08/2017  . Disorder of bone, unspecified 08/08/2017  . COPD with hypoxia ([place]) 06/27/2017  . Arthralgia of acromioclavicular joint (Right) 06/15/2017  . Acromioclavicular joint DJD (Right) 06/15/2017  .  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  . Chronic shoulder pain (Left) 05/11/2017  . Elevated brain natriuretic peptide (BNP) level 05/11/2017  . Grief at loss of child 02/14/2017  . Lumbar facet hypertrophy (multilevel) 11/17/2016  . Lumbar spinal stenosis (L4-5) 11/17/2016  . 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) 11/17/2016  . 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 Lumbosacral radiculopathy at L4 11/17/2016  . Supplemental oxygen dependent 08/01/2016  . Sleep apnea 06/02/2016  . Long term current use of opiate analgesic  01/12/2016  . Long term prescription opiate use 01/12/2016  . Opiate use (15 MME/Day) 01/12/2016  . Lumbar facet syndrome (Bilateral) (L>R) 01/12/2016  . Chronic sacroiliac joint pain (Bilateral) 01/12/2016  . Vitamin D deficiency 12/30/2015  . 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 Obesity, Class III, BMI 40-49.9 (morbid obesity) ([place]) 12/28/2015  . 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[place]) 11/19/2015  . Coagulation disorder ([place]) 10/22/2015  . Chronic pain syndrome (significant psychosocial component) 09/21/2015  . 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 09/21/2015  . Encounter for therapeutic drug level monitoring 09/16/2015  . Encounter for chronic pain management 09/16/2015  . Pain management 09/16/2015  . Neurogenic pain 09/16/2015  . Neuropathic pain 09/16/2015  . Musculoskeletal pain 09/16/2015  . Lumbar spondylosis (L4-5) 09/16/2015  . Chronic low back pain (Primary Area of Pain) (Bilateral) (L>R) 09/16/2015  . Chronic lower extremity pain (Secondary Area of Pain) (Left) 09/16/2015  . Chronic lumbar radicular pain (L4 & S1 Dermatome) (Left) 09/16/2015  . Chronic shoulder pain (Right side) 09/16/2015  . Chronic upper extremity pain (Right-sided) 09/16/2015  . Cervical radiculitis (Right side) 09/16/2015  . Abnormal x-ray of lumbar spine 09/16/2015  . Chronic diastolic heart failure ([place]) 07/15/2015  . Chest pain 07/15/2015  . Tobacco abuse 07/15/2015  . Blood clotting disorder ([place]) 04/21/2015  . Decreased motor strength 07/16/2014  . Clinical depression 07/16/2014  . Chronic rhinitis 07/16/2014  . Carpal tunnel syndrome 07/16/2014  . Anxiety state 07/16/2014  . Neuritis or radiculitis due to rupture of lumbar intervertebral disc 07/02/2014  . DDD (degenerative disc disease), lumbar 07/02/2014  . CAFL (chronic airflow limitation) ([place]) 02/27/2014  . Essential (primary) hypertension 03/27/2008    Past Surgical History:    Procedure Laterality Date  . ABDOMINAL HYSTERECTOMY    . CARPAL TUNNEL RELEASE Left 2012  . EPIGASTRIC HERNIA REPAIR N/A 08/13/2018   HERNIA REPAIR EPIGASTRIC ADULT; polypropylene mesh reinforcement.  Surgeon: Robert Bellow, MD;  Location: [facility] ORS;  Service: General;  Laterality: N/A;  . RIGHT/LEFT HEART CATH AND CORONARY ANGIOGRAPHY N/A 07/11/2019   Procedure: RIGHT/LEFT HEART CATH AND CORONARY ANGIOGRAPHY;  Surgeon: Yolonda Kida, MD;  Location: [place] [facility];  Service: Cardiovascular;  Laterality: N/A;  . TONSILLECTOMY    . TUBAL LIGATION    . UMBILICAL HERNIA REPAIR N/A 08/13/2018   HERNIA REPAIR UMBILICAL ADULT; polypropylene mesh reinforcement Surgeon: Robert Bellow, MD;  Location: [facility] ORS;  Service: General;  Laterality: N/A;    Prior to Admission medications   Medication Sig Start Date End Date Taking? Authorizing Provider  ALPRAZolam Duanne Moron) 0.5 MG tablet Take 1 tablet (0.5 mg total) by mouth 2 (two) times daily as needed for anxiety. Patient taking differently: Take 0.5 mg by mouth daily as needed for anxiety.  12/09/16   [name], FNP  aspirin EC 81 MG tablet Take 81 mg by mouth daily.    [provider]  baclofen (LIORESAL) 10 MG tablet  Take 1 tablet (10 mg total) by mouth 3 (three) times daily. 08/14/19 02/10/20  Milinda Pointer, MD  bisoprolol-hydrochlorothiazide [facility]) 10-6.25 MG tablet TAKE 1 TABLET BY MOUTH DAILY 06/27/19   Chrismon, Vickki Muff, PA  Cholecalciferol ([facility] D3) 2000 units CAPS Take 2,000 Units by mouth daily.     [provider]  digoxin (LANOXIN) 0.25 MG tablet TAKE 1 TABLET(0.25 MG) BY MOUTH DAILY 08/16/19   Chrismon, Vickki Muff, PA  Fluticasone-Umeclidin-Vilant 100-62.5-25 MCG/INH AEPB Inhale 1 puff into the lungs daily. 10/03/18   [provider]  gabapentin (NEURONTIN) 300 MG capsule Take 1 capsule (300 mg total) by mouth 4 (four) times daily. 08/16/19 02/12/20  Milinda Pointer, MD   HYDROcodone-acetaminophen (NORCO/VICODIN) 5-325 MG tablet Take 1 tablet by mouth every 8 (eight) hours as needed for severe pain. Must last 30 days 10/15/19 11/14/19  Milinda Pointer, MD  HYDROcodone-acetaminophen (NORCO/VICODIN) 5-325 MG tablet Take 1 tablet by mouth every 8 (eight) hours as needed for severe pain. Must last 30 days 11/14/19 12/14/19  Milinda Pointer, MD  HYDROcodone-acetaminophen (NORCO/VICODIN) 5-325 MG tablet Take 1 tablet by mouth every 8 (eight) hours as needed for severe pain. Must last 30 days 12/14/19 01/13/20  Milinda Pointer, MD  ibuprofen (ADVIL,MOTRIN) 200 MG tablet Take 1,200 mg by mouth every 6 (six) hours as needed for headache or moderate pain.    [provider]  ipratropium-albuterol (DUONEB) 0.5-2.5 (3) MG/3ML SOLN USE 1 VIAL VIA NEBULIZER EVERY 6 HOURS AS NEEDED FOR SHORTNESS OF BREATH OR WHEEZING 10/14/19   Chrismon, Vickki Muff, PA  levothyroxine (SYNTHROID) 25 MCG tablet TAKE 1 TABLET BY MOUTH EVERY DAY BEFORE BREAKFAST 09/24/19   Chrismon, Vickki Muff, PA  potassium chloride SA (KLOR-CON) 20 MEQ tablet TAKE 1 TABLET(20 MEQ) BY MOUTH TWICE DAILY 09/15/19   Darylene Price A, FNP  sertraline (ZOLOFT) 100 MG tablet TAKE 2 TABLETS(200 MG) BY MOUTH EVERY MORNING Patient taking differently: Take 200 mg by mouth daily.  11/16/18   Chrismon, Vickki Muff, PA  torsemide (DEMADEX) 20 MG tablet Take 2 tablets (40 mg total) by mouth 2 (two) times daily. 07/13/19   Bettey Costa, MD  VENTOLIN HFA 108 (90 Base) MCG/ACT inhaler Inhale 2 puffs into the lungs every 6 (six) hours as needed for shortness of breath. 02/10/18   Fritzi Mandes, MD    Allergies Codeine and Meloxicam  Family History  Problem Relation Age of Onset  . Heart disease Mother   . Stroke Mother   . Coronary artery disease Mother   . Lung cancer Sister   . Cancer Sister   . Breast cancer Sister   . Alcohol abuse Father   . Heart disease Father   . Cancer Brother   . Cancer Brother   . Pneumonia  Brother   . Prostate cancer Neg Hx   . Kidney cancer Neg Hx   . Bladder Cancer Neg Hx     Social History Social History   Tobacco Use  . Smoking status: Current Every Day Smoker    Packs/day: 1.00    Years: 44.00    Pack years: 44.00    Types: Cigarettes  . Smokeless tobacco: Never Used  Substance Use Topics  . Alcohol use: No  . Drug use: No    Review of Systems  Constitutional: Negative for fever. Eyes: Negative for visual changes. ENT: Negative for sore throat. Neck: No neck pain  Cardiovascular: + chest pain. Respiratory: +shortness of breath, cough, wheezing Gastrointestinal: Negative for abdominal pain, vomiting  or diarrhea. Genitourinary: Negative for dysuria. Musculoskeletal: Negative for back pain. Skin: Negative for rash. Neurological: Negative for headaches, weakness or numbness. Psych: No SI or HI  ____________________________________________   PHYSICAL EXAM:  VITAL SIGNS: ED Triage Vitals [10/22/19 2218]  Enc Vitals Group     BP 111/77     Pulse Rate 67     Resp 20     Temp 98.4 F (36.9 C)     Temp Source Oral     SpO2 95 %     Weight 247 lb (112 kg)     Height _0  (1.575 m)     Head Circumference      Peak Flow      Pain Score 0     Pain Loc      Pain Edu?      Excl. in [place]?     Constitutional: Alert and oriented. Well appearing and in no apparent distress. HEENT:      Head: Normocephalic and atraumatic.         Eyes: Conjunctivae are normal. Sclera is non-icteric.       Mouth/Throat: Mucous membranes are moist.       Neck: Supple with no signs of meningismus. Cardiovascular: Regular rate and rhythm. No murmurs, gallops, or rubs. 2+ symmetrical distal pulses are present in all extremities. No JVD. Respiratory: Slightly increased work of breathing, satting well on 3 L nasal cannula, faint wheezes bilaterally Gastrointestinal: Soft, non tender, and non distended with positive bowel sounds. No rebound or guarding. Musculoskeletal:  Nontender with normal range of motion in all extremities. No edema, cyanosis, or erythema of extremities. Neurologic: Normal speech and language. Face is symmetric. Moving all extremities. No gross focal neurologic deficits are appreciated. Skin: Skin is warm, dry and intact. No rash noted. Psychiatric: Mood and affect are normal. Speech and behavior are normal.  ____________________________________________   LABS (all labs ordered are listed, but only abnormal results are displayed)  Labs Reviewed  CBC - Abnormal; Notable for the following components:      Result Value   WBC 13.6 (*)    RDW 17.2 (*)    All other components within normal limits  COMPREHENSIVE METABOLIC PANEL - Abnormal; Notable for the following components:   Chloride 95 (*)    CO2 33 (*)    Glucose, Bld 127 (*)    BUN 32 (*)    Creatinine, Ser 1.47 (*)    Alkaline Phosphatase 131 (*)    GFR calc non Af Amer 38 (*)    GFR calc Af Amer 44 (*)    All other components within normal limits  BRAIN NATRIURETIC PEPTIDE - Abnormal; Notable for the following components:   B Natriuretic Peptide 221.0 (*)    All other components within normal limits  TROPONIN I (HIGH SENSITIVITY)  TROPONIN I (HIGH SENSITIVITY)   ____________________________________________  EKG  ED ECG REPORT I, Rudene Re, the attending physician, personally viewed and interpreted this ECG.  Normal sinus rhythm, rate of 72, normal intervals, normal axis, no ST elevations or depressions.  EKG is unchanged when compared to prior. ____________________________________________  RADIOLOGY  I have personally reviewed the images performed during this visit and I agree with the Radiologist's read.   Interpretation by Radiologist:  DG Chest 2 View  Result Date: 10/22/2019 CLINICAL DATA:  Shortness of breath EXAM: CHEST - 2 VIEW COMPARISON:  07/06/2019 FINDINGS: Cardiac shadows within normal limits. The lungs are well aerated bilaterally. No focal  infiltrate or  sizable effusion is seen. Stable interstitial opacities are noted. No bony abnormality is seen. IMPRESSION: Chronic interstitial markings stable from the prior exam. Electronically Signed   By: Inez Catalina M.D.   On: 10/22/2019 23:12     ____________________________________________   PROCEDURES  Procedure(s) performed: None Procedures Critical Care performed:  None ____________________________________________   INITIAL IMPRESSION / ASSESSMENT AND PLAN / ED COURSE   64 y.o. female with history of obstructive sleep apnea on BiPAP, COPD on 2.5 L nasal cannula, HFpEF, hypertension, hyperlipidemia who presents for evaluation of shortness of breath, productive cough, wheezing, and intermittent chest pain x3 days.  Patient is actively wheezing satting well on her 3 L nasal cannula, faint wheezes bilaterally.  Chest x-ray negative for pneumonia.  Patient recently tested for Covid is negative.  No signs or symptoms of Covid.  Her presentation is concerning for COPD exacerbation versus bronchitis versus CHF exacerbation.  Patient looks euvolemic.  Chest x-ray no evidence of edema.  Will treat with DuoNeb x3 and prednisone and doxycycline.  Kidney function is slightly increased to 1.47 (baseline is 1).  At this time we will hold off fluids due to history of heart failure to prevent exacerbating patient shortness of breath.  She has an appointment with her heart failure provider tomorrow.  Will defer any management of this to her.  Will reassess after breathing treatments.    _________________________ 5:35 AM on 10/23/2019 -----------------------------------------  Patient feels improved after duo nebs and Solu-Medrol.  Remained with normal work of breathing and satting well on her home oxygen.  She has a follow-up appointment at the heart failure clinic tomorrow which I encouraged her to keep and to discuss with her provider about the acute kidney injury.  Recommended close follow-up with  PCP.  Discussed my standard return precautions.  She is going to be discharged home on prednisone, doxycycline, and albuterol for bronchitis.    As part of my medical decision making, I reviewed the following data within the [place] notes reviewed and incorporated, Labs reviewed , EKG interpreted , Old EKG reviewed, Old chart reviewed, Radiograph reviewed , Notes from prior ED visits and [place] Controlled Substance Database   Please note:  Patient was evaluated in Emergency Department today for the symptoms described in the history of present illness. Patient was evaluated in the context of the global COVID-19 pandemic, which necessitated consideration that the patient might be at risk for infection with the SARS-CoV-2 virus that causes COVID-19. Institutional protocols and algorithms that pertain to the evaluation of patients at risk for COVID-19 are in a state of rapid change based on information released by regulatory bodies including the CDC and federal and state organizations. These policies and algorithms were followed during the patient's care in the ED.  Some ED evaluations and interventions may be delayed as a result of limited staffing during the pandemic.   ____________________________________________   FINAL CLINICAL IMPRESSION(S) / ED DIAGNOSES   Final diagnoses:  None      NEW MEDICATIONS STARTED DURING THIS VISIT:  ED Discharge Orders    None       Note:  This document was prepared using Dragon voice recognition software and may include unintentional dictation errors.    Alfred Levins, [place], MD 10/23/19 [phone]

## 2019-10-24 ENCOUNTER — Inpatient Hospital Stay: Payer: Medicare HMO | Admitting: Physician Assistant

## 2019-10-24 ENCOUNTER — Inpatient Hospital Stay: Payer: Medicare HMO | Admitting: Family Medicine

## 2019-10-30 ENCOUNTER — Ambulatory Visit: Payer: Medicare HMO | Admitting: Family Medicine

## 2019-10-30 ENCOUNTER — Encounter: Payer: Self-pay | Admitting: Family Medicine

## 2019-10-30 ENCOUNTER — Ambulatory Visit (INDEPENDENT_AMBULATORY_CARE_PROVIDER_SITE_OTHER): Payer: Medicare HMO | Admitting: Family Medicine

## 2019-10-30 ENCOUNTER — Other Ambulatory Visit: Payer: Self-pay

## 2019-10-30 VITALS — BP 118/68 | HR 69 | Temp 97.1°F | Resp 16 | Ht 62.0 in | Wt 247.0 lb

## 2019-10-30 DIAGNOSIS — R300 Dysuria: Secondary | ICD-10-CM

## 2019-10-30 DIAGNOSIS — J441 Chronic obstructive pulmonary disease with (acute) exacerbation: Secondary | ICD-10-CM

## 2019-10-30 DIAGNOSIS — Z6841 Body Mass Index (BMI) 40.0 and over, adult: Secondary | ICD-10-CM | POA: Diagnosis not present

## 2019-10-30 DIAGNOSIS — R404 Transient alteration of awareness: Secondary | ICD-10-CM

## 2019-10-30 DIAGNOSIS — N179 Acute kidney failure, unspecified: Secondary | ICD-10-CM

## 2019-10-30 LAB — POCT URINALYSIS DIPSTICK
Bilirubin, UA: NEGATIVE
Blood, UA: NEGATIVE
Glucose, UA: NEGATIVE
Ketones, UA: NEGATIVE
Leukocytes, UA: NEGATIVE
Nitrite, UA: NEGATIVE
Protein, UA: NEGATIVE
Spec Grav, UA: 1.01 (ref 1.010–1.025)
Urobilinogen, UA: 0.2 E.U./dL
pH, UA: 6 (ref 5.0–8.0)

## 2019-10-30 MED ORDER — ALPRAZOLAM 0.5 MG PO TABS: 0.5000 mg | ORAL_TABLET | Freq: Two times a day (BID) | ORAL | 0 refills | Status: DC | PRN

## 2019-10-30 NOTE — Progress Notes (Signed)
Patient: Theresa [name] Female    DOB: 21-Nov-1955   64 y.o.   MRN: [id number] Visit Date: 10/30/2019  Today's Provider: Lavon Paganini, MD   Chief Complaint  Patient presents with  . Follow-up   Subjective:    I [place] S. Dimas, CMA, am acting as scribe for Lavon Paganini, MD. HPI    Follow up ER visit  Patient was seen in ER for Shortness of Breath on 10/23/2019. She was treated for; bronchitis and shortness of breath. Workup  for this included Chest X-ray, labs, ekg.  She was treated with prednisone and doxycycline for presumed COPD exacerbation.  She continues to use her controller inhalers.  She uses albuterol as needed. She reports excellent compliance with treatment. She reports this condition is Unchanged.   Patient reports that on 10/11/2019 she got up at 4 am and was headed to the bathroom, and at 7 am her husband found her on the floor and unconscious.  She does not have a memory of what happened to her.  She reports that when he found her there were items kind of strewn across the kitchen where she was.  She is unclear how long her loss of consciousness was.  She was seen in the emergency department later that day when her confusion persisted and she seemed to have slurred speech.  She was worked up with CT and MRI and stroke was ruled out.  They initially thought that she may be intoxicated from her gabapentin or hydrocodone, but she had not taken any of that that day and her urine tox screen was negative for all substances.  Patient reports that she has had several falls in the last several months. Patient reports feeling very tired and sleepy. Patient reports she falls asleep often while she is just sitting. Patient reports she did not get an answer on why she blacked out in December.  States she does not take her hydrocodone daily.  She also only uses her gabapentin as needed, but she does take this every day.  She has never seen neurology for this problem.  She  is never had any witnessed seizure activity, but several of these falls, including the 1 on 12/25, have been unwitnessed.  She does have some confusion after her falls, especially on 12/25.  She is unclear about any bowel or bladder incontinence that occurred that day.  She does report that she had many bruises across her body after that incident.  She also reports dysuria that is longstanding, 3 to 4 weeks, and very mild.  States it feels like a pinch at the end of urination.  She was noted to have AKI during her last ER visit.  Thought to be prerenal related to increased respiratory losses and decreased p.o. intake.  She needs this rechecked today. ------------------------------------------------------------------------------------   Allergies  Allergen Reactions  . Codeine Nausea And Vomiting  . Meloxicam Other (See Comments)    Stomach pain     Current Outpatient Medications:  .  albuterol (VENTOLIN HFA) 108 (90 Base) MCG/ACT inhaler, Inhale 2 puffs into the lungs every 6 (six) hours as needed for wheezing or shortness of breath., Disp: 8 g, Rfl: 1 .  ALPRAZolam (XANAX) 0.5 MG tablet, Take 1 tablet (0.5 mg total) by mouth 2 (two) times daily as needed for anxiety. (Patient taking differently: Take 0.5 mg by mouth daily as needed for anxiety. ), Disp: 60 tablet, Rfl: 0 .  aspirin EC 81 MG tablet,  Take 81 mg by mouth daily., Disp: , Rfl:  .  baclofen (LIORESAL) 10 MG tablet, Take 1 tablet (10 mg total) by mouth 3 (three) times daily., Disp: 90 tablet, Rfl: 5 .  bisoprolol-hydrochlorothiazide (ZIAC) 10-6.25 MG tablet, TAKE 1 TABLET BY MOUTH DAILY, Disp: 90 tablet, Rfl: 3 .  Cholecalciferol (CVS D3) 2000 units CAPS, Take 2,000 Units by mouth daily. , Disp: , Rfl:  .  digoxin (LANOXIN) 0.25 MG tablet, TAKE 1 TABLET(0.25 MG) BY MOUTH DAILY, Disp: 90 tablet, Rfl: 0 .  Fluticasone-Umeclidin-Vilant 100-62.5-25 MCG/INH AEPB, Inhale 1 puff into the lungs daily., Disp: , Rfl:  .  gabapentin  (NEURONTIN) 300 MG capsule, Take 1 capsule (300 mg total) by mouth 4 (four) times daily., Disp: 120 capsule, Rfl: 5 .  HYDROcodone-acetaminophen (NORCO/VICODIN) 5-325 MG tablet, Take 1 tablet by mouth every 8 (eight) hours as needed for severe pain. Must last 30 days, Disp: 90 tablet, Rfl: 0 .  ibuprofen (ADVIL,MOTRIN) 200 MG tablet, Take 1,200 mg by mouth every 6 (six) hours as needed for headache or moderate pain., Disp: , Rfl:  .  ipratropium-albuterol (DUONEB) 0.5-2.5 (3) MG/3ML SOLN, USE 1 VIAL VIA NEBULIZER EVERY 6 HOURS AS NEEDED FOR SHORTNESS OF BREATH OR WHEEZING, Disp: 360 mL, Rfl: 1 .  levothyroxine (SYNTHROID) 25 MCG tablet, TAKE 1 TABLET BY MOUTH EVERY DAY BEFORE BREAKFAST, Disp: 30 tablet, Rfl: 3 .  potassium chloride SA (KLOR-CON) 20 MEQ tablet, TAKE 1 TABLET(20 MEQ) BY MOUTH TWICE DAILY, Disp: 180 tablet, Rfl: 3 .  sertraline (ZOLOFT) 100 MG tablet, TAKE 2 TABLETS(200 MG) BY MOUTH EVERY MORNING (Patient taking differently: Take 200 mg by mouth daily. ), Disp: 180 tablet, Rfl: 3 .  torsemide (DEMADEX) 20 MG tablet, Take 2 tablets (40 mg total) by mouth 2 (two) times daily., Disp: , Rfl:  .  [START ON 11/14/2019] HYDROcodone-acetaminophen (NORCO/VICODIN) 5-325 MG tablet, Take 1 tablet by mouth every 8 (eight) hours as needed for severe pain. Must last 30 days, Disp: 90 tablet, Rfl: 0 .  [START ON 12/14/2019] HYDROcodone-acetaminophen (NORCO/VICODIN) 5-325 MG tablet, Take 1 tablet by mouth every 8 (eight) hours as needed for severe pain. Must last 30 days, Disp: 90 tablet, Rfl: 0  Review of Systems  Constitutional: Positive for fatigue. Negative for appetite change, chills and fever.  HENT: Positive for congestion, postnasal drip, sinus pressure, sinus pain and sneezing. Negative for ear pain and rhinorrhea.   Respiratory: Positive for cough and wheezing. Negative for chest tightness and shortness of breath.   Cardiovascular: Positive for palpitations. Negative for chest pain.    Gastrointestinal: Negative for abdominal pain, nausea and vomiting.  Neurological: Positive for dizziness, weakness and headaches.  Psychiatric/Behavioral: Positive for sleep disturbance. The patient is nervous/anxious.     Social History   Tobacco Use  . Smoking status: Current Every Day Smoker    Packs/day: 1.00    Years: 44.00    Pack years: 44.00    Types: Cigarettes  . Smokeless tobacco: Never Used  Substance Use Topics  . Alcohol use: No      Objective:   BP 118/68 (BP Location: Left Arm, Patient Position: Sitting, Cuff Size: Large)   Pulse 69   Temp (!) 97.1 F (36.2 C) (Temporal)   Resp 16   Ht _0  (1.575 m)   Wt 247 lb (112 kg)   SpO2 95% Comment: 2.5L of oxygen  BMI 45.18 kg/m  Vitals:   10/30/19 1436  BP: 118/68  Pulse: 69  Resp: 16  Temp: (!) 97.1 F (36.2 C)  TempSrc: Temporal  SpO2: 95%  Weight: 247 lb (112 kg)  Height: _0  (1.575 m)  Body mass index is 45.18 kg/m.   Physical Exam Vitals reviewed.  Constitutional:      General: She is not in acute distress.    Appearance: She is obese.     Comments: Sitting in wheelchair, nasal cannula in place  HENT:     Head: Normocephalic and atraumatic.  Eyes:     General: No scleral icterus.    Conjunctiva/sclera: Conjunctivae normal.  Cardiovascular:     Rate and Rhythm: Normal rate and regular rhythm.     Pulses: Normal pulses.     Heart sounds: Normal heart sounds. No murmur.  Pulmonary:     Effort: Pulmonary effort is normal. No respiratory distress.     Comments: Mild end expiratory wheezing diffusely, and slightly decreased breath sounds in her bases.  No crackles Abdominal:     General: There is no distension.     Palpations: Abdomen is soft.     Tenderness: There is no abdominal tenderness.  Musculoskeletal:     Cervical back: Neck supple.  Lymphadenopathy:     Cervical: No cervical adenopathy.  Skin:    General: Skin is warm and dry.     Findings: No rash.  Neurological:      Mental Status: She is alert and oriented to person, place, and time. Mental status is at baseline.     Cranial Nerves: No cranial nerve deficit.     Sensory: No sensory deficit.     Motor: Weakness (L leg weaker than right leg, which she reports is at baseline) present.  Psychiatric:        Mood and Affect: Mood normal.        Behavior: Behavior normal.      No results found for any visits on 10/30/19.     Assessment & Plan    1. Episode of altered consciousness -Intermittent issue with history of multiple falls in the last several months that were unwitnessed and patient is unsure if she had loss of consciousness -The episode on 12/25 with loss of consciousness and confusion afterward is concerning for possible seizure activity -Intoxication was ruled out with labs in the emergency department -CT and MRI of her head/brain were normal without any findings to suggest stroke or other pathology that led to this event -She returned to baseline prior to being discharged, without intervention, which suggests possible post ictal state -Advised on some seizure precautions and fall precautions -She needs to see neurology for evaluation - Ambulatory referral to Neurology  2. AKI (acute kidney injury) ([place]) -Noted during recent ER visit and thought to be prerenal in etiology -Asymptomatic currently -Recheck renal function panel - Renal Function Panel  3. COPD exacerbation ([facility]) -Recently seen in the emergency department for COPD exacerbation -She is back to her home oxygen level and only using it intermittently -She is breathing comfortably today and has a benign exam -Continue controller medications -She has finished prednisone and doxycycline and does not need additional treatment at this time -Return precautions discussed -Due to her chronic respiratory failure from COPD, she does occasionally take Xanax for feeling of breathlessness -Small refill was given today and she was advised  to take very carefully and not take at the same time as her hydrocodone for fear of respiratory suppression -She can discuss further refills with her PCP  4. Dysuria -Mild  and UA is normal without any signs of infection -Encouraged hydrating well and avoidance of caffeine and other bladder irritants -Return precautions discussed    Meds ordered this encounter  Medications  . ALPRAZolam (XANAX) 0.5 MG tablet    Sig: Take 1 tablet (0.5 mg total) by mouth 2 (two) times daily as needed for anxiety.    Dispense:  20 tablet    Refill:  0     Return in about 4 weeks (around 11/27/2019) for chronic disease f/u wiht PCP.   The entirety of the information documented in the History of Present Illness, Review of Systems and Physical Exam were personally obtained by me. Portions of this information were initially documented by Lynford Humphrey, CMA and reviewed by me for thoroughness and accuracy.    Shyann Hefner, Dionne Bucy, MD MPH [facility]

## 2019-10-31 LAB — RENAL FUNCTION PANEL
Albumin: 4.1 g/dL (ref 3.8–4.8)
BUN/Creatinine Ratio: 26 (ref 12–28)
BUN: 21 mg/dL (ref 8–27)
CO2: 32 mmol/L — ABNORMAL HIGH (ref 20–29)
Calcium: 10.2 mg/dL (ref 8.7–10.3)
Chloride: 93 mmol/L — ABNORMAL LOW (ref 96–106)
Creatinine, Ser: 0.8 mg/dL (ref 0.57–1.00)
GFR calc Af Amer: 91 mL/min/{1.73_m2} (ref >59)
GFR calc non Af Amer: 79 mL/min/{1.73_m2} (ref >59)
Glucose: 96 mg/dL (ref 65–99)
Phosphorus: 3.4 mg/dL (ref 3.0–4.3)
Potassium: 3.6 mmol/L (ref 3.5–5.2)
Sodium: 141 mmol/L (ref 134–144)

## 2019-11-01 IMAGING — DX DG CHEST 1V
2 series · 2 of 2 positions shown · non-contrast
Comparison: 02/07/2018

CLINICAL DATA: Shortness of breath, chest pain

EXAM:
CHEST  1 VIEW

[chest ap (1 of 2)]
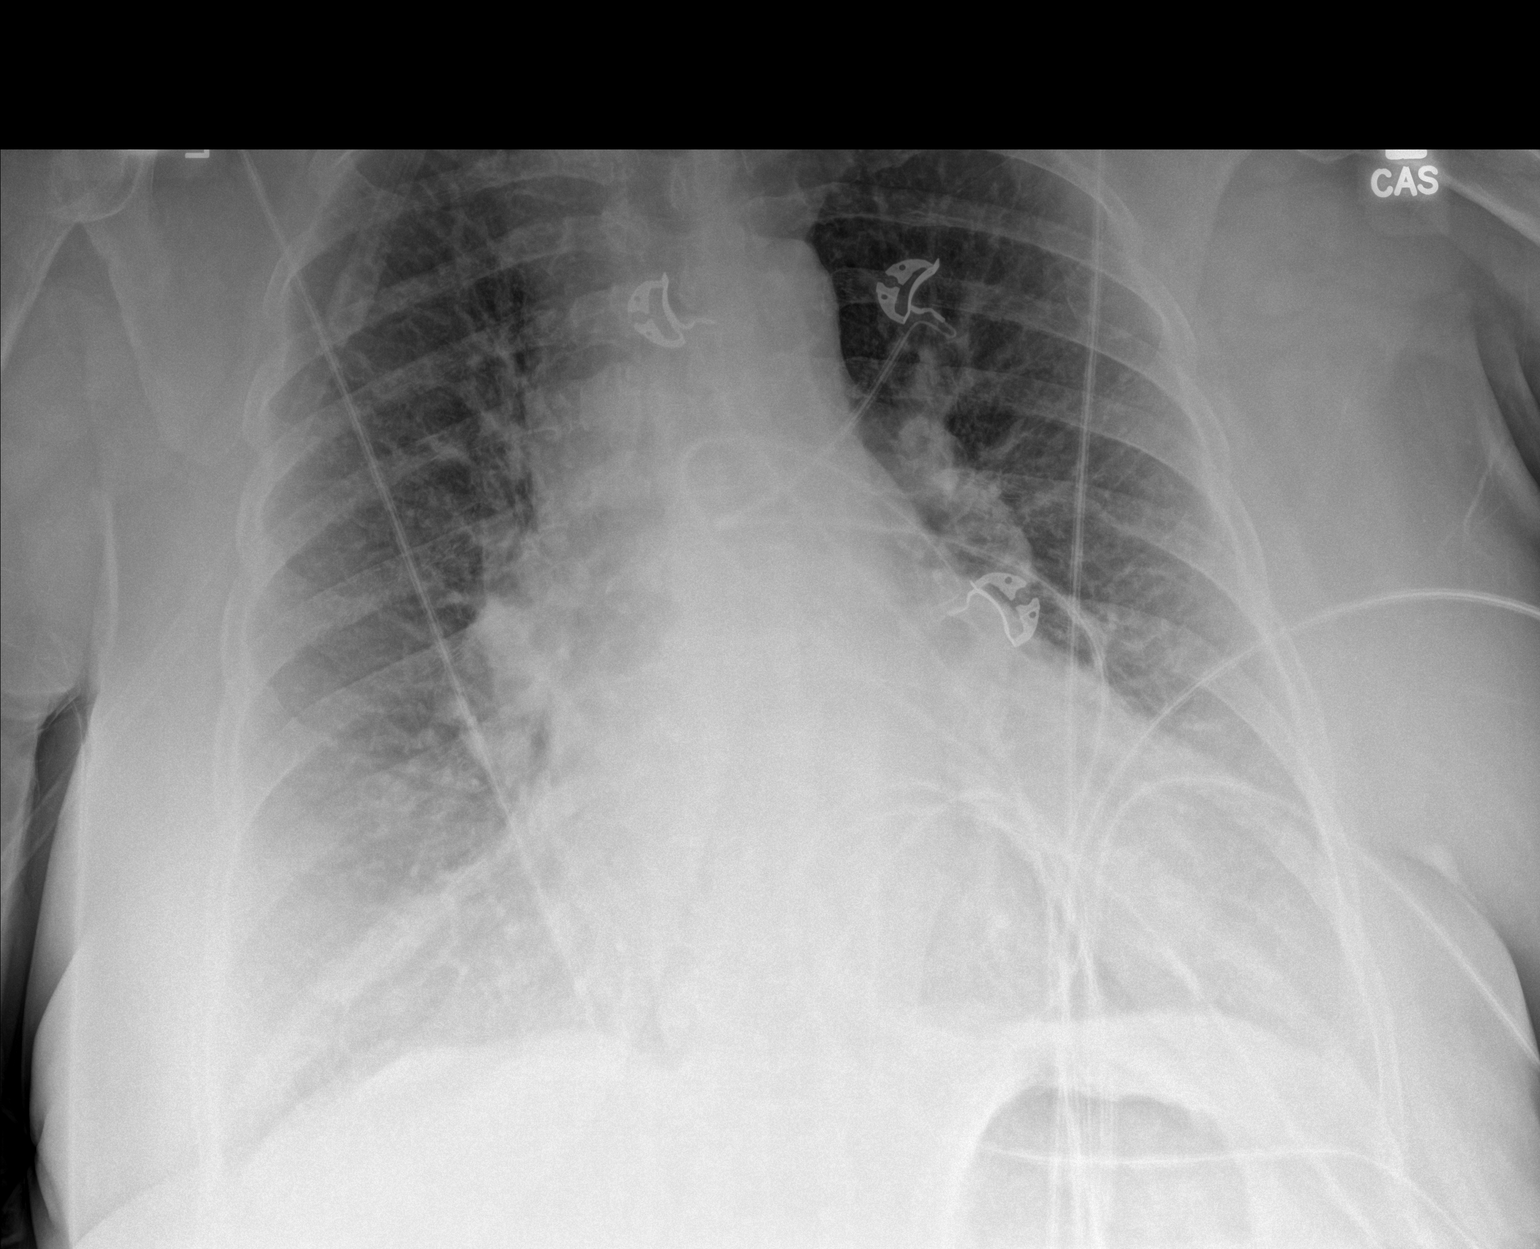

[chest ap (2 of 2)]
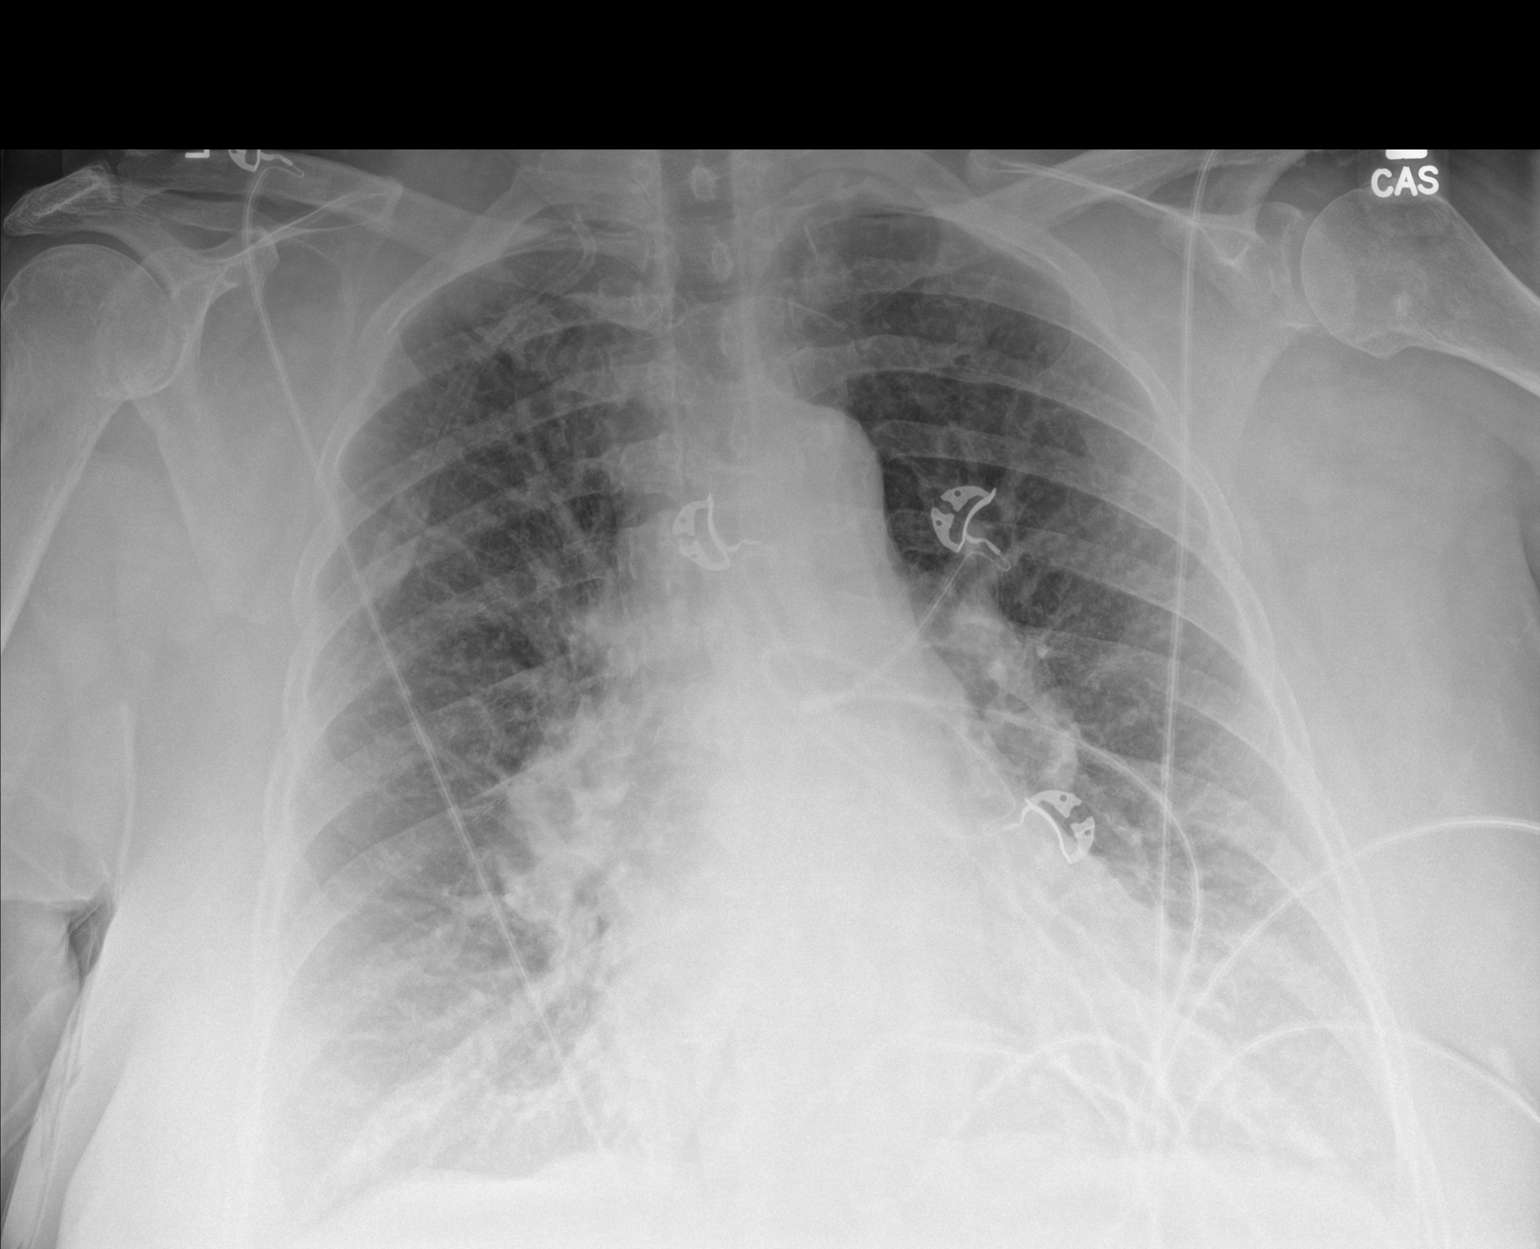

[2 of 2 positions shown; findings below may reference images not displayed]

FINDINGS: Cardiomegaly. Mild, diffuse bilateral interstitial pulmonary
opacity. The visualized skeletal structures are unremarkable.
IMPRESSION: Cardiomegaly with mild, diffuse bilateral interstitial pulmonary
opacity, likely mild edema. No focal airspace opacity.

## 2019-11-08 IMAGING — CR DG ABDOMEN 1V
1 series · 3 of 3 positions shown · non-contrast
Comparison: None.

CLINICAL DATA: Constipation.

EXAM:
ABDOMEN - 1 VIEW

[Series 1: dg abd 1 view · 0.14mm/px · 3 of 3 slices shown]
[im 1/3]
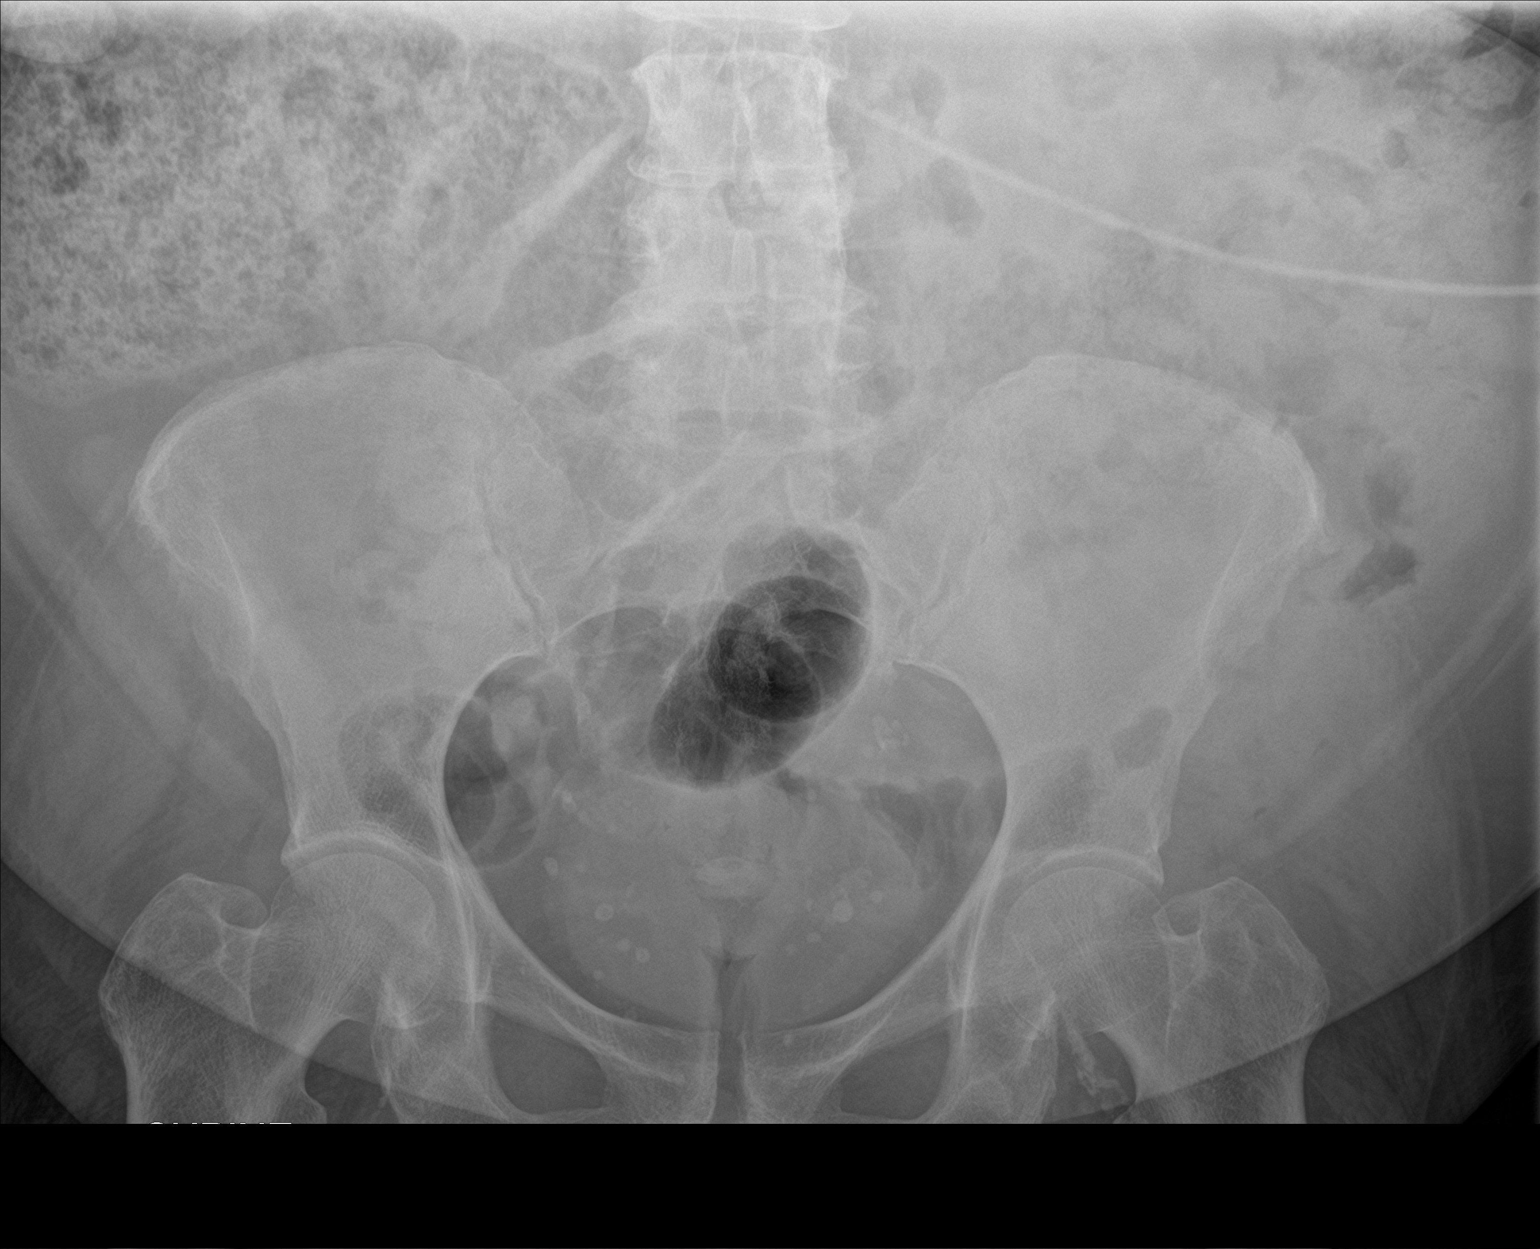
[im 2/3]
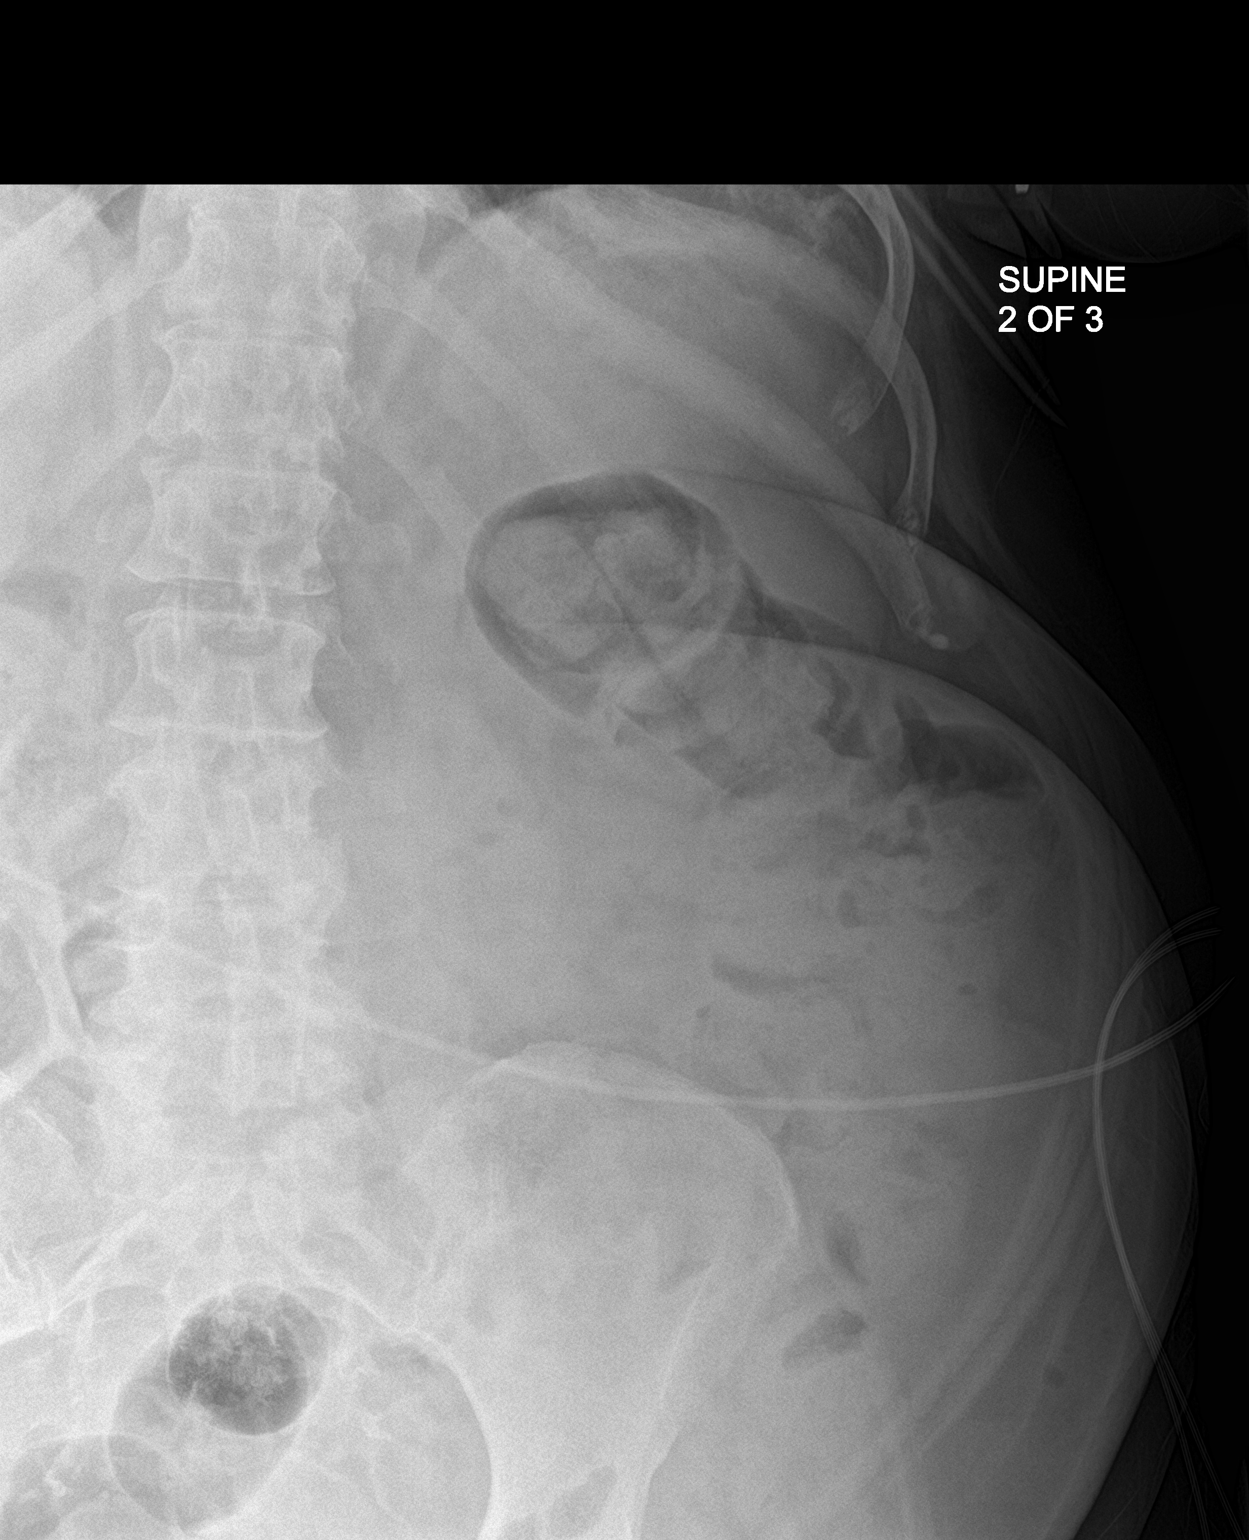
[im 3/3]
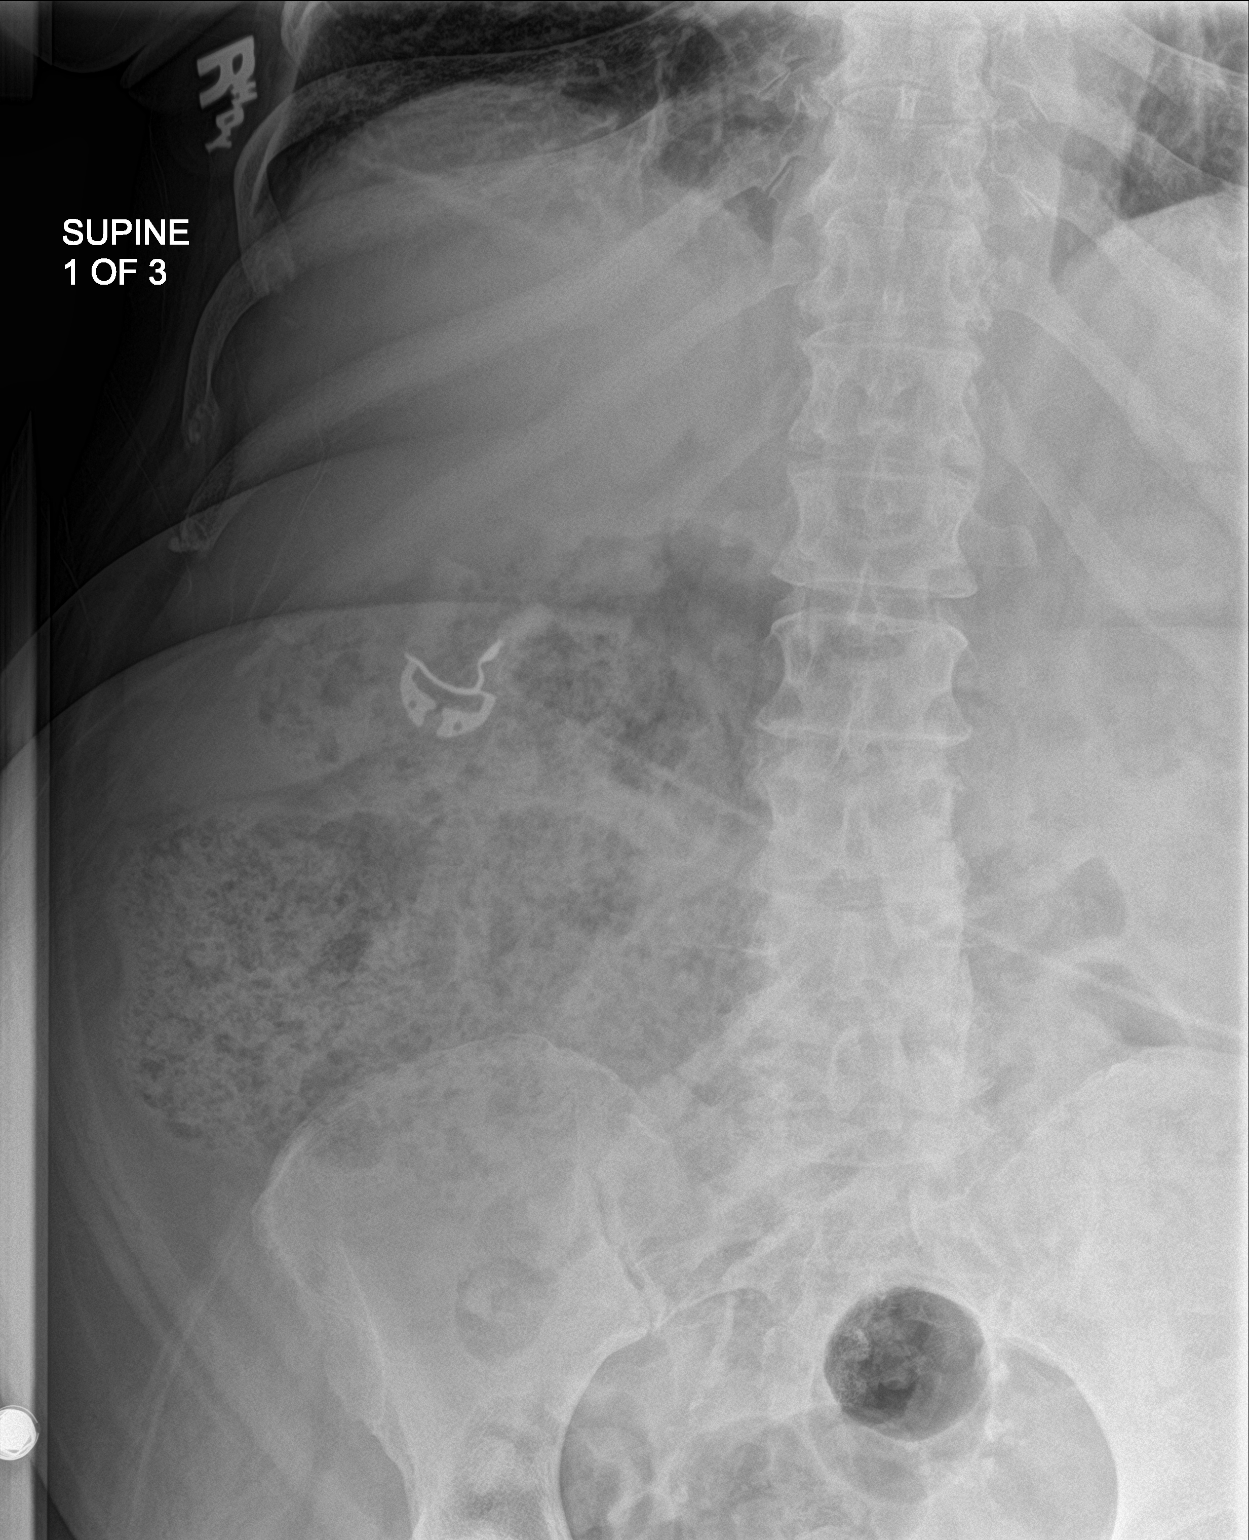

[3 of 3 positions shown; findings below may reference images not displayed]

FINDINGS: No gaseous small bowel dilatation to suggest obstruction. Large
stool volume evident. Phleboliths overlie the inferior pelvis
bilaterally. No suspicious lytic or sclerotic osseous abnormality.
IMPRESSION: Large stool volume compatible with the reported clinical history of
constipation.

## 2019-11-09 IMAGING — US US EXTREM LOW VENOUS*L*
1 series · 13 of 24 positions shown · non-contrast
Comparison: None.

CLINICAL DATA: Left lower extremity pain.



[Series 1: us extrem low venous*left* · 13 of 34 slices shown]
[im 1/34]
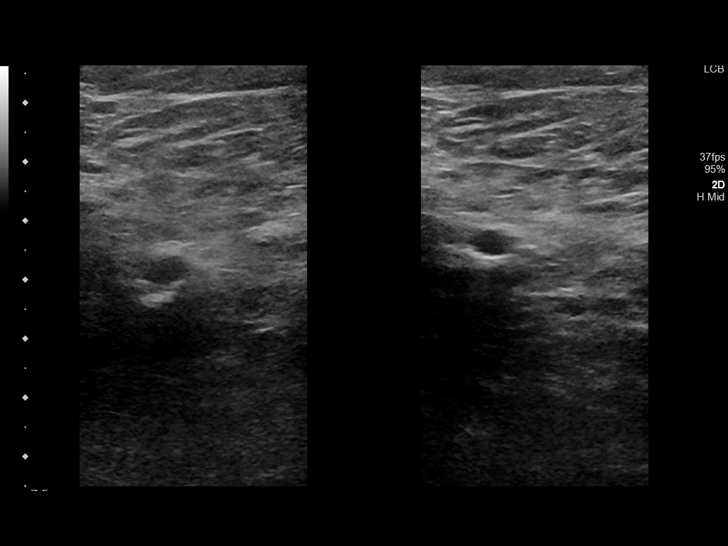
[im 3/34]
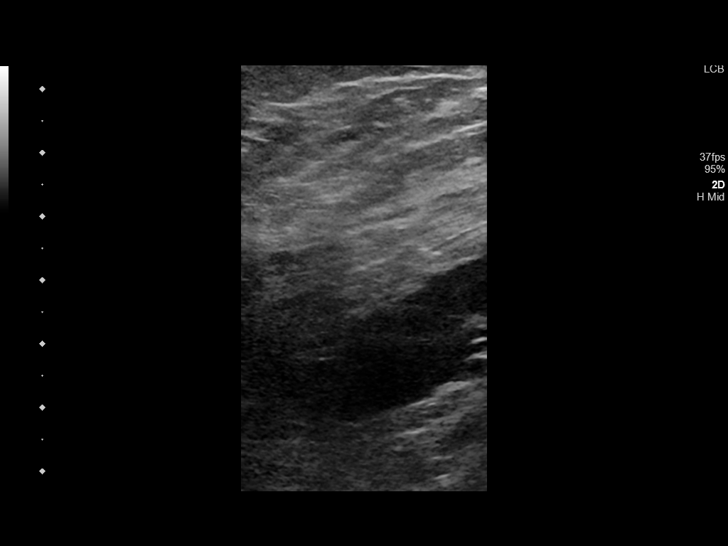
[im 6/34]
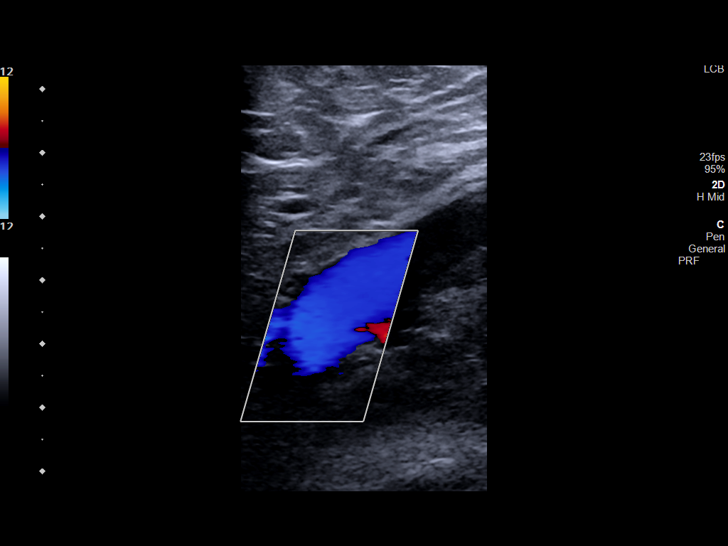
[im 9/34]
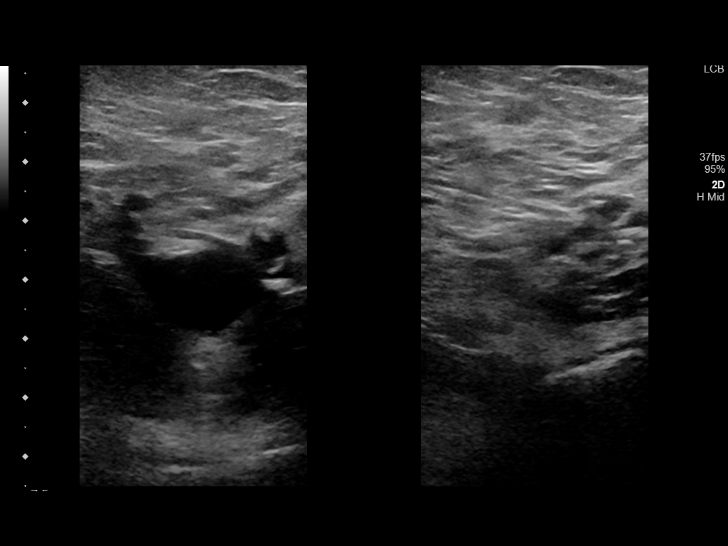
[im 12/34]
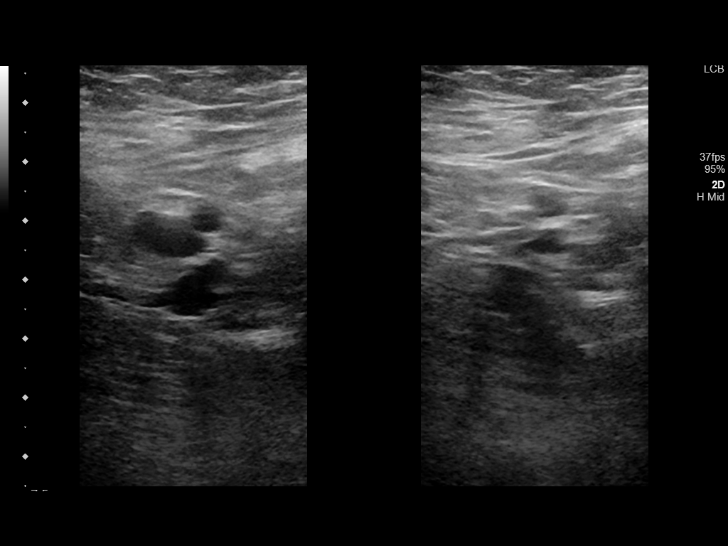
[im 15/34]
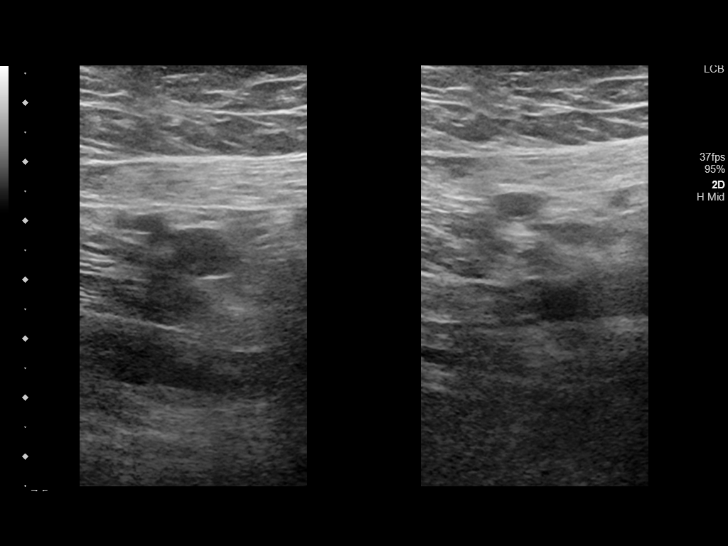
[im 18/34]
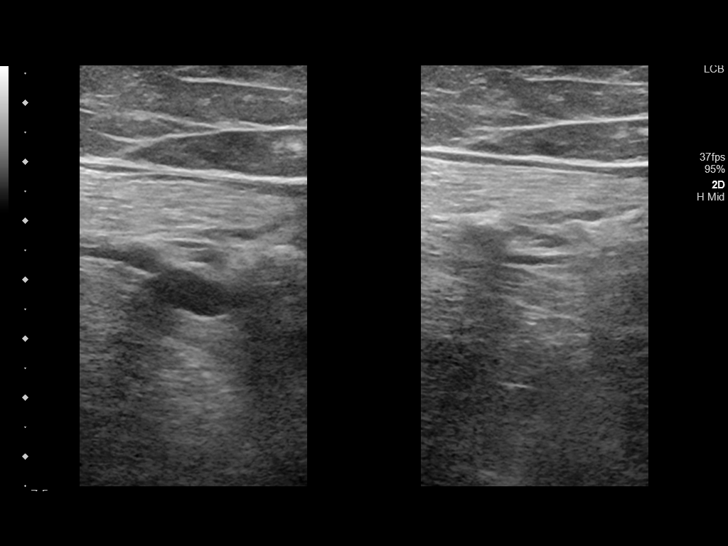
[im 19/34]
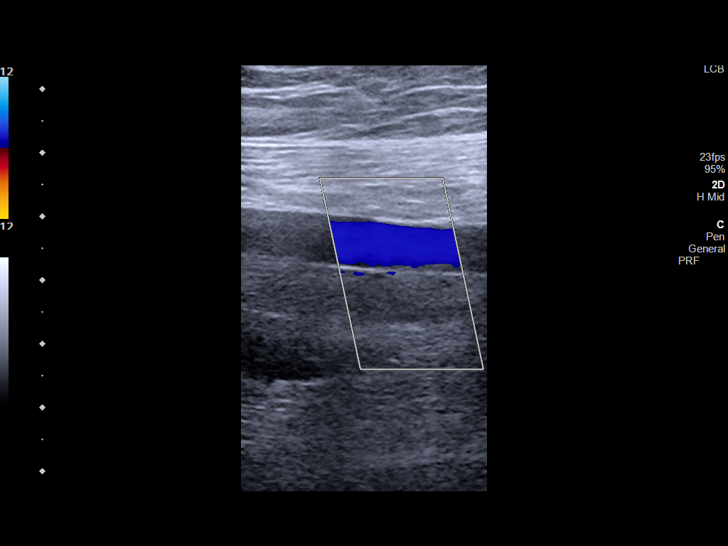
[im 22/34]
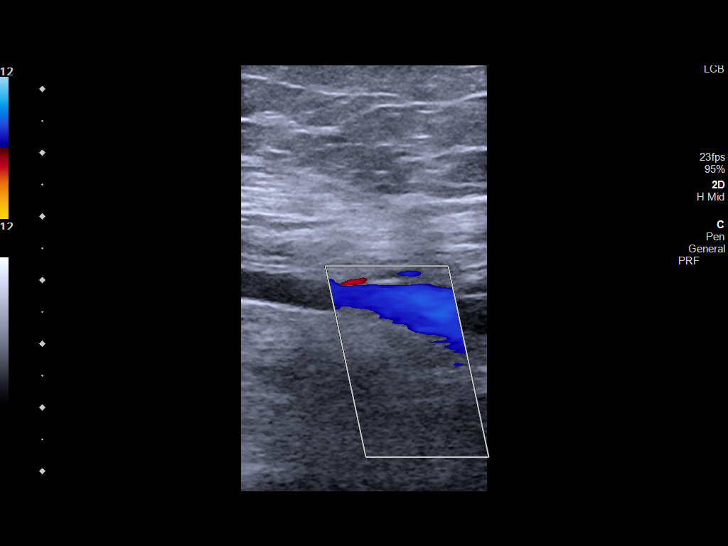
[im 25/34]
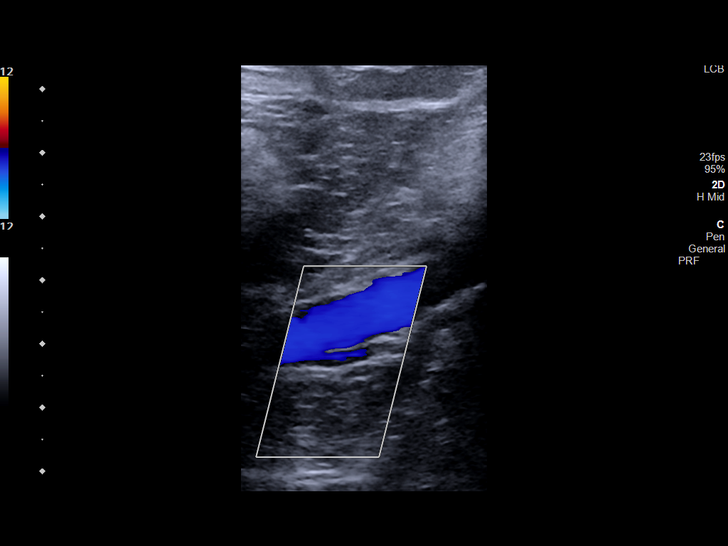
[im 28/34]
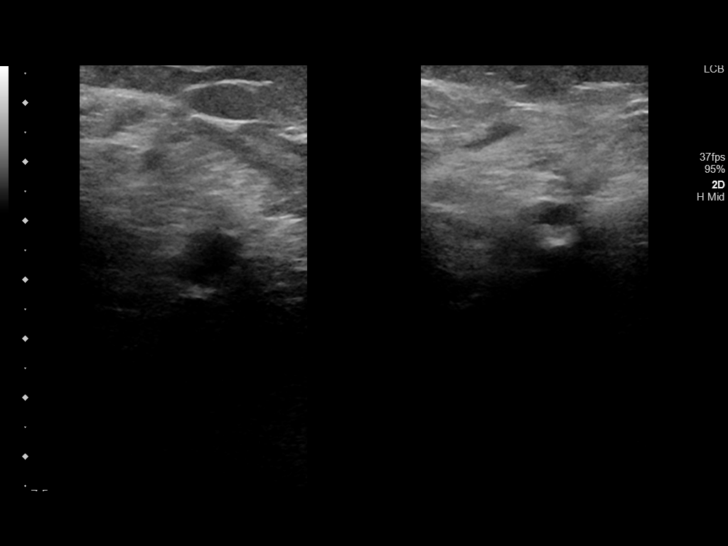
[im 31/34]
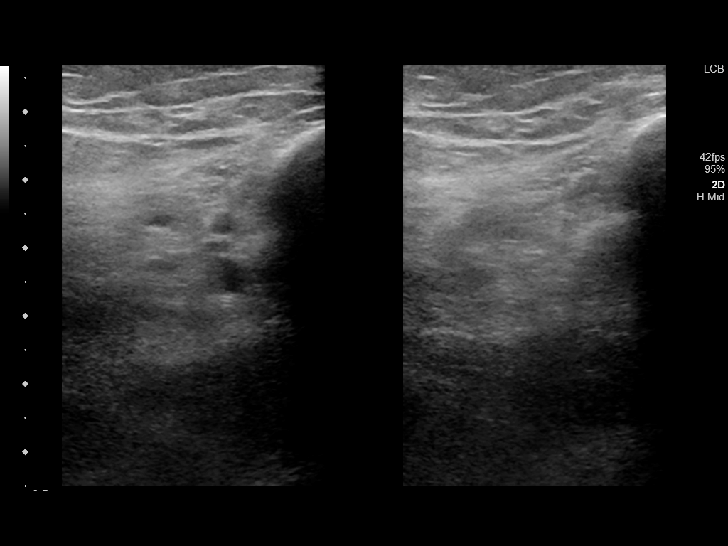
[im 34/34]
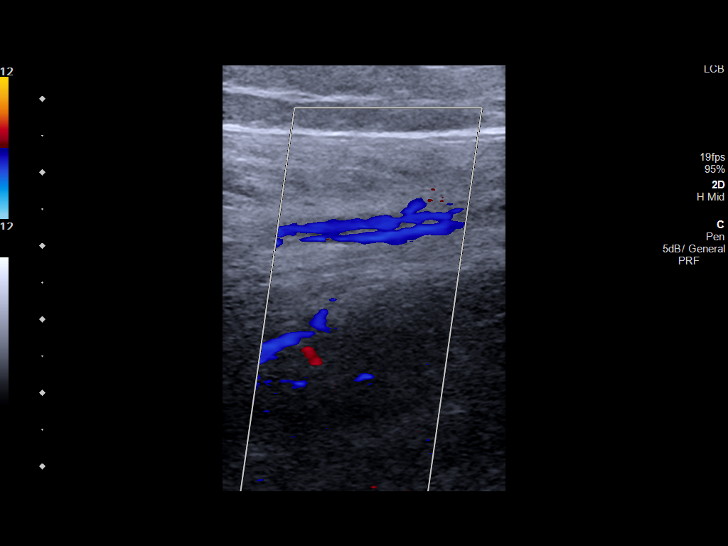

[13 of 24 positions shown; findings below may reference images not displayed]

FINDINGS: Contralateral Common Femoral Vein: Respiratory phasicity is normal
and symmetric with the symptomatic side. No evidence of thrombus.
Normal compressibility.

Common Femoral Vein: No evidence of thrombus. Normal
compressibility, respiratory phasicity and response to augmentation.

Saphenofemoral Junction: No evidence of thrombus. Normal
compressibility and flow on color Doppler imaging.

Profunda Femoral Vein: No evidence of thrombus. Normal
compressibility and flow on color Doppler imaging.

Femoral Vein: No evidence of thrombus. Normal compressibility,
respiratory phasicity and response to augmentation.

Popliteal Vein: No evidence of thrombus. Normal compressibility,
respiratory phasicity and response to augmentation.

Calf Veins: No evidence of thrombus. Normal compressibility and flow
on color Doppler imaging.

Venous Reflux:  None.

Other Findings:  None.
IMPRESSION: No evidence of deep venous thrombosis.

## 2019-11-13 ENCOUNTER — Telehealth: Payer: Medicare HMO | Admitting: Pain Medicine

## 2019-11-17 ENCOUNTER — Other Ambulatory Visit: Payer: Self-pay | Admitting: Family Medicine

## 2019-11-21 ENCOUNTER — Ambulatory Visit: Payer: Medicare HMO | Admitting: Family Medicine

## 2019-11-23 DIAGNOSIS — J9610 Chronic respiratory failure, unspecified whether with hypoxia or hypercapnia: Secondary | ICD-10-CM | POA: Diagnosis not present

## 2019-11-24 ENCOUNTER — Other Ambulatory Visit: Payer: Self-pay | Admitting: Family Medicine

## 2019-11-24 ENCOUNTER — Other Ambulatory Visit: Payer: Self-pay | Admitting: Family

## 2019-11-24 DIAGNOSIS — F411 Generalized anxiety disorder: Secondary | ICD-10-CM

## 2019-11-24 NOTE — Telephone Encounter (Signed)
Requested Prescriptions  Pending Prescriptions Disp Refills  . sertraline (ZOLOFT) 100 MG tablet [Pharmacy Med Name: SERTRALINE 100MG TABLETS] 180 tablet 3    Sig: TAKE 2 TABLETS BY MOUTH EVERY MORNING     Psychiatry:  Antidepressants - SSRI Passed - 11/24/2019  9:26 AM      Passed - Completed PHQ-2 or PHQ-9 in the last 360 days.      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

## 2019-11-25 DIAGNOSIS — R29898 Other symptoms and signs involving the musculoskeletal system: Secondary | ICD-10-CM | POA: Diagnosis not present

## 2019-11-25 DIAGNOSIS — R296 Repeated falls: Secondary | ICD-10-CM | POA: Diagnosis not present

## 2019-11-25 DIAGNOSIS — Z720 Tobacco use: Secondary | ICD-10-CM | POA: Diagnosis not present

## 2019-11-25 DIAGNOSIS — M545 Low back pain: Secondary | ICD-10-CM | POA: Diagnosis not present

## 2019-11-25 DIAGNOSIS — R404 Transient alteration of awareness: Secondary | ICD-10-CM | POA: Diagnosis not present

## 2019-11-26 DIAGNOSIS — R29898 Other symptoms and signs involving the musculoskeletal system: Secondary | ICD-10-CM | POA: Insufficient documentation

## 2019-11-26 DIAGNOSIS — R296 Repeated falls: Secondary | ICD-10-CM | POA: Insufficient documentation

## 2019-11-26 DIAGNOSIS — R404 Transient alteration of awareness: Secondary | ICD-10-CM | POA: Insufficient documentation

## 2019-11-30 ENCOUNTER — Other Ambulatory Visit: Payer: Self-pay | Admitting: Family Medicine

## 2019-11-30 NOTE — Telephone Encounter (Signed)
Message for pharmacy for pt to make appt for refills Requested Prescriptions  Pending Prescriptions Disp Refills  . levothyroxine (SYNTHROID) 25 MCG tablet [Pharmacy Med Name: LEVOTHYROXINE 0.025MG (25MCG) TAB] 90 tablet 0    Sig: TAKE 1 TABLET(25 MCG) BY MOUTH DAILY BEFORE BREAKFAST     Endocrinology:  Hypothyroid Agents Failed - 11/30/2019  5:31 PM      Failed - TSH needs to be rechecked within 3 months after an abnormal result. Refill until TSH is due.      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

## 2019-12-02 ENCOUNTER — Ambulatory Visit: Payer: Medicare HMO | Admitting: Family Medicine

## 2019-12-02 DIAGNOSIS — Z6841 Body Mass Index (BMI) 40.0 and over, adult: Secondary | ICD-10-CM | POA: Diagnosis not present

## 2019-12-02 DIAGNOSIS — J432 Centrilobular emphysema: Secondary | ICD-10-CM | POA: Diagnosis not present

## 2019-12-02 DIAGNOSIS — J449 Chronic obstructive pulmonary disease, unspecified: Secondary | ICD-10-CM | POA: Diagnosis not present

## 2019-12-02 DIAGNOSIS — Z720 Tobacco use: Secondary | ICD-10-CM | POA: Diagnosis not present

## 2019-12-02 DIAGNOSIS — I272 Pulmonary hypertension, unspecified: Secondary | ICD-10-CM | POA: Diagnosis not present

## 2019-12-02 DIAGNOSIS — Z9981 Dependence on supplemental oxygen: Secondary | ICD-10-CM | POA: Diagnosis not present

## 2019-12-02 DIAGNOSIS — R0600 Dyspnea, unspecified: Secondary | ICD-10-CM | POA: Diagnosis not present

## 2019-12-02 DIAGNOSIS — I2729 Other secondary pulmonary hypertension: Secondary | ICD-10-CM | POA: Diagnosis not present

## 2019-12-16 DIAGNOSIS — R202 Paresthesia of skin: Secondary | ICD-10-CM | POA: Diagnosis not present

## 2019-12-16 DIAGNOSIS — R296 Repeated falls: Secondary | ICD-10-CM | POA: Diagnosis not present

## 2019-12-16 DIAGNOSIS — R29898 Other symptoms and signs involving the musculoskeletal system: Secondary | ICD-10-CM | POA: Diagnosis not present

## 2019-12-16 DIAGNOSIS — R200 Anesthesia of skin: Secondary | ICD-10-CM | POA: Diagnosis not present

## 2019-12-21 DIAGNOSIS — J9610 Chronic respiratory failure, unspecified whether with hypoxia or hypercapnia: Secondary | ICD-10-CM | POA: Diagnosis not present

## 2019-12-23 ENCOUNTER — Other Ambulatory Visit: Payer: Self-pay

## 2019-12-23 ENCOUNTER — Encounter: Payer: Self-pay | Admitting: Family Medicine

## 2019-12-23 ENCOUNTER — Ambulatory Visit (INDEPENDENT_AMBULATORY_CARE_PROVIDER_SITE_OTHER): Payer: Medicare HMO | Admitting: Family Medicine

## 2019-12-23 VITALS — BP 124/80 | HR 71 | Temp 97.1°F

## 2019-12-23 DIAGNOSIS — I10 Essential (primary) hypertension: Secondary | ICD-10-CM

## 2019-12-23 DIAGNOSIS — J449 Chronic obstructive pulmonary disease, unspecified: Secondary | ICD-10-CM

## 2019-12-23 DIAGNOSIS — Z6841 Body Mass Index (BMI) 40.0 and over, adult: Secondary | ICD-10-CM | POA: Diagnosis not present

## 2019-12-23 DIAGNOSIS — R0902 Hypoxemia: Secondary | ICD-10-CM

## 2019-12-23 DIAGNOSIS — I5032 Chronic diastolic (congestive) heart failure: Secondary | ICD-10-CM | POA: Diagnosis not present

## 2019-12-23 DIAGNOSIS — Z87898 Personal history of other specified conditions: Secondary | ICD-10-CM | POA: Diagnosis not present

## 2019-12-23 DIAGNOSIS — G5601 Carpal tunnel syndrome, right upper limb: Secondary | ICD-10-CM | POA: Diagnosis not present

## 2019-12-23 NOTE — Progress Notes (Signed)
Theresa [name]  MRN: [id number] DOB: 10-24-1955  Subjective:  HPI   The patient is a 64 year old female who presents for follow up of her hypertension.  The patient was last seen on 10/30/19 for a follow up after being seen in the ER.    Hypertension, follow-up:  BP Readings from Last 3 Encounters:  12/23/19 124/80  10/30/19 118/68  10/23/19 99/62   BP readings at home are running around 90's over 50's.  She does sometimes feel dizzy when they are that low but not always. Management since that visit includes no changes   Weight trend: stable Wt Readings from Last 3 Encounters:  10/30/19 247 lb (112 kg)  10/23/19 249 lb (112.9 kg)  10/22/19 247 lb (112 kg)     ------------------------------------------------------------------------  Patient Active Problem List   Diagnosis Date Noted  . Protruded lumbar disc (L2-3) (Left) 09/10/2019  . Chronic knee pain (Left) 08/14/2019  . Chronic lumbar radiculopathy/radiculitis (L2) (Left) 08/14/2019  . Abnormal MRI, lumbar spine (07/23/2019) 08/14/2019  . Acute on chronic respiratory failure with hypoxia ([place]) 07/06/2019  . Cervicalgia (Bilateral) (R>L) 09/24/2018  . Spondylosis without myelopathy or radiculopathy, lumbosacral region 09/24/2018  . Epigastric hernia 05/04/2018  . Chronic lower extremity pain (Bilateral) (L>R) 01/25/2018  . Altered mental status, unspecified 12/09/2017  . Chronic hip pain [facility]) (Bilateral) (L>R) 11/27/2017  . COPD (chronic obstructive pulmonary disease) ([place]) 08/11/2017  . Other long term (current) drug therapy 08/08/2017  . Disorder of bone, unspecified 08/08/2017  . COPD with hypoxia ([place]) 06/27/2017  . Arthralgia of acromioclavicular joint (Right) 06/15/2017  . Acromioclavicular joint DJD (Right) 06/15/2017  . 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  . Chronic shoulder pain (Left) 05/11/2017  . Elevated brain natriuretic peptide (BNP) level 05/11/2017  . Grief at loss of  child 02/14/2017  . Lumbar facet hypertrophy (multilevel) 11/17/2016  . Lumbar spinal stenosis (L4-5) 11/17/2016  . 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) 11/17/2016  . 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 Lumbosacral radiculopathy at L4 11/17/2016  . Supplemental oxygen dependent 08/01/2016  . Sleep apnea 06/02/2016  . Long term current use of opiate analgesic 01/12/2016  . Long term prescription opiate use 01/12/2016  . Opiate use (15 MME/Day) 01/12/2016  . Lumbar facet syndrome (Bilateral) (L>R) 01/12/2016  . Chronic sacroiliac joint pain (Bilateral) 01/12/2016  . Vitamin D deficiency 12/30/2015  . 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 Obesity, Class III, BMI 40-49.9 (morbid obesity) ([place]) 12/28/2015  . 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 Coagulation disorder ([place]) 10/22/2015  . Chronic pain syndrome (significant psychosocial component) 09/21/2015  . 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 09/21/2015  . Encounter for therapeutic drug level monitoring 09/16/2015  . Encounter for chronic pain management 09/16/2015  . Pain management 09/16/2015  . Neurogenic pain 09/16/2015  . Neuropathic pain 09/16/2015  . Musculoskeletal pain 09/16/2015  . Lumbar spondylosis (L4-5) 09/16/2015  . Chronic low back pain (Primary Area of Pain) (Bilateral) (L>R) 09/16/2015  . Chronic lower extremity pain (Secondary Area of Pain) (Left) 09/16/2015  . Chronic lumbar radicular pain (L4 & S1 Dermatome) (Left) 09/16/2015  . Chronic shoulder pain (Right side) 09/16/2015  . Chronic upper extremity pain (Right-sided) 09/16/2015  . Cervical radiculitis (Right side) 09/16/2015  . Abnormal x-ray of lumbar spine 09/16/2015  . Chronic diastolic heart failure ([place]) 07/15/2015  . Chest pain 07/15/2015  . Tobacco abuse 07/15/2015  . Blood clotting  disorder ([place]) 04/21/2015  . Decreased motor strength  07/16/2014  . Clinical depression 07/16/2014  . Chronic rhinitis 07/16/2014  . Carpal tunnel syndrome 07/16/2014  . Anxiety state 07/16/2014  . Neuritis or radiculitis due to rupture of lumbar intervertebral disc 07/02/2014  . DDD (degenerative disc disease), lumbar 07/02/2014  . CAFL (chronic airflow limitation) ([place]) 02/27/2014  . Essential (primary) hypertension 03/27/2008    Past Medical History:  Diagnosis Date  . Acute postoperative pain 10/03/2017  . Anxiety   . BiPAP (biphasic positive airway pressure) dependence    at hs  . Carpal tunnel syndrome   . CHF (congestive heart failure) ([place])    1/18  . CHF (congestive heart failure) ([place])   . Chronic generalized abdominal pain   . Chronic rhinitis   . COPD (chronic obstructive pulmonary disease) ([place])   . DDD (degenerative disc disease), cervical   . DDD (degenerative disc disease), lumbosacral   . Depression   . Dyspnea   . Edema   . Flu    1/18  . Gastritis   . GERD (gastroesophageal reflux disease)   . Hematuria   . Hernia of abdominal wall 07/17/2018  . Hernia, abdominal   . Hypertension   . Kidney stones   . Low back pain   . Lumbar radiculitis   . Malodorous urine   . Muscle weakness   . Obesity   . Oxygen dependent   . Renal cyst   . Sensory urge incontinence   . Thyroid activity decreased    9/19  . Tobacco abuse   . Wheezing    Past Surgical History:  Procedure Laterality Date  . ABDOMINAL HYSTERECTOMY    . CARPAL TUNNEL RELEASE Left 2012  . EPIGASTRIC HERNIA REPAIR N/A 08/13/2018   HERNIA REPAIR EPIGASTRIC ADULT; polypropylene mesh reinforcement.  Surgeon: Robert Bellow, MD;  Location: [facility] ORS;  Service: General;  Laterality: N/A;  . RIGHT/LEFT HEART CATH AND CORONARY ANGIOGRAPHY N/A 07/11/2019   Procedure: RIGHT/LEFT HEART CATH AND CORONARY ANGIOGRAPHY;  Surgeon: Yolonda Kida, MD;  Location: [facility];  Service: Cardiovascular;  Laterality: N/A;  . TONSILLECTOMY    .  TUBAL LIGATION    . UMBILICAL HERNIA REPAIR N/A 08/13/2018   HERNIA REPAIR UMBILICAL ADULT; polypropylene mesh reinforcement Surgeon: Robert Bellow, MD;  Location: [facility] ORS;  Service: General;  Laterality: N/A;   Family History  Problem Relation Age of Onset  . Heart disease Mother   . Stroke Mother   . Coronary artery disease Mother   . Lung cancer Sister   . Cancer Sister   . Breast cancer Sister   . Alcohol abuse Father   . Heart disease Father   . Cancer Brother   . Cancer Brother   . Pneumonia Brother   . Prostate cancer Neg Hx   . Kidney cancer Neg Hx   . Bladder Cancer Neg Hx    Social History   Socioeconomic History  . Marital status: Married    Spouse name: Ovid Curd  . Number of children: 2  . Years of education: Not on file  . Highest education level: 12th grade  Occupational History  . Occupation: disability  Tobacco Use  . Smoking status: Current Every Day Smoker    Packs/day: 1.00    Years: 44.00    Pack years: 44.00    Types: Cigarettes  . Smokeless tobacco: Never Used  Substance and Sexual Activity  . Alcohol use: No  . Drug use:  No  . Sexual activity: Not Currently    Birth control/protection: Surgical  Other Topics Concern  . Not on file  Social History Narrative   ** Merged History Encounter **       Lives with spouse    Social Determinants of Health   Financial Resource Strain: Medium Risk  . Difficulty of Paying Living Expenses: Somewhat hard  Food Insecurity:   . Worried About Charity fundraiser in the Last Year: Not on file  . Ran Out of Food in the Last Year: Not on file  Transportation Needs:   . Lack of Transportation (Medical): Not on file  . Lack of Transportation (Non-Medical): Not on file  Physical Activity: Inactive  . Days of Exercise per Week: 0 days  . Minutes of Exercise per Session: 0 min  Stress:   . Feeling of Stress : Not on file  Social Connections: Unknown  . Frequency of Communication with Friends and  Family: Patient refused  . Frequency of Social Gatherings with Friends and Family: Patient refused  . Attends Religious Services: Patient refused  . Active Member of Clubs or Organizations: Patient refused  . Attends Archivist Meetings: Patient refused  . Marital Status: Patient refused  Intimate Partner Violence: Unknown  . Fear of Current or Ex-Partner: Patient refused  . Emotionally Abused: Patient refused  . Physically Abused: Patient refused  . Sexually Abused: Patient refused    Outpatient Encounter Medications as of 12/23/2019  Medication Sig  . albuterol (VENTOLIN HFA) 108 (90 Base) MCG/ACT inhaler Inhale 2 puffs into the lungs every 6 (six) hours as needed for wheezing or shortness of breath.  . ALPRAZolam (XANAX) 0.5 MG tablet Take 1 tablet (0.5 mg total) by mouth 2 (two) times daily as needed for anxiety.  Marland Kitchen aspirin EC 81 MG tablet Take 81 mg by mouth daily.  . baclofen (LIORESAL) 10 MG tablet Take 1 tablet (10 mg total) by mouth 3 (three) times daily.  . bisoprolol-hydrochlorothiazide (ZIAC) 10-6.25 MG tablet TAKE 1 TABLET BY MOUTH DAILY  . Cholecalciferol ([employer] D3) 2000 units CAPS Take 2,000 Units by mouth daily.   . digoxin (LANOXIN) 0.25 MG tablet TAKE 1 TABLET(0.25 MG) BY MOUTH DAILY  . Fluticasone-Umeclidin-Vilant 100-62.5-25 MCG/INH AEPB Inhale 1 puff into the lungs daily.  Marland Kitchen gabapentin (NEURONTIN) 300 MG capsule Take 1 capsule (300 mg total) by mouth 4 (four) times daily.  Marland Kitchen HYDROcodone-acetaminophen (NORCO/VICODIN) 5-325 MG tablet Take 1 tablet by mouth every 8 (eight) hours as needed for severe pain. Must last 30 days  . ipratropium-albuterol (DUONEB) 0.5-2.5 (3) MG/3ML SOLN USE 1 VIAL VIA NEBULIZER EVERY 6 HOURS AS NEEDED FOR SHORTNESS OF BREATH OR WHEEZING  . levothyroxine (SYNTHROID) 25 MCG tablet TAKE 1 TABLET(25 MCG) BY MOUTH DAILY BEFORE BREAKFAST  . potassium chloride SA (KLOR-CON) 20 MEQ tablet TAKE 1 TABLET(20 MEQ) BY MOUTH TWICE DAILY  . sertraline  (ZOLOFT) 100 MG tablet TAKE 2 TABLETS BY MOUTH EVERY MORNING  . torsemide (DEMADEX) 20 MG tablet TAKE 2 TABLETS(40 MG) BY MOUTH TWICE DAILY  . torsemide (DEMADEX) 20 MG tablet TAKE 2 TABLETS(40 MG) BY MOUTH TWICE DAILY  . HYDROcodone-acetaminophen (NORCO/VICODIN) 5-325 MG tablet Take 1 tablet by mouth every 8 (eight) hours as needed for severe pain. Must last 30 days  . HYDROcodone-acetaminophen (NORCO/VICODIN) 5-325 MG tablet Take 1 tablet by mouth every 8 (eight) hours as needed for severe pain. Must last 30 days  . ibuprofen (ADVIL,MOTRIN) 200 MG tablet Take 1,200  mg by mouth every 6 (six) hours as needed for headache or moderate pain.   No facility-administered encounter medications on file as of 12/23/2019.   Allergies  Allergen Reactions  . Codeine Nausea And Vomiting  . Meloxicam Other (See Comments)    Stomach pain   Review of Systems  Constitutional: Negative for chills, fever and malaise/fatigue.  HENT: Negative for congestion, ear pain, sinus pain and sore throat.   Respiratory: Negative for cough and shortness of breath.   Cardiovascular: Negative for chest pain and leg swelling.  Neurological: Negative for headaches.    Objective:  BP 124/80 (BP Location: Left Arm, Patient Position: Sitting, Cuff Size: Normal)   Pulse 71   Temp (!) 97.1 F (36.2 C) (Skin)   SpO2 93% Comment: on 2 LPM  Physical Exam  Constitutional: She is oriented to person, place, and time and well-developed, well-nourished, and in no distress.  HENT:  Head: Normocephalic.  Eyes: Conjunctivae are normal.  Cardiovascular: Normal rate.  Pulmonary/Chest: Effort normal. She has no wheezes. She has no rales.  No rales or rhonchi. Easier respirations on oxygen by nasal cannula at 2-2.5 LPM.  Abdominal: Soft. Bowel sounds are normal.  Musculoskeletal:        General: No edema. Normal range of motion.     Cervical back: Neck supple.  Neurological: She is alert and oriented to person, place, and time.    Skin: No rash noted.  Psychiatric: Mood, affect and judgment normal.    Assessment and Plan :   1. Essential (primary) hypertension Normal BP today. Has had episodes of BP in the 90-80/60-50 range. Still taking Ziac 10 mg qd each morning. No recent falls or syncopal episodes. Recommend monitoring BP each morning before taking Ziac. If BP below 100/60 in the morning, should hold Ziac and recheck BP at noon. If still low, should not take Ziac until BP above 130/90. May need to adjust dosage to 5 mg if hypotension persists. Recheck labs and follow pending reports. Maintain follow up with cardiologist (Dr. Clayborn Bigness). - CBC with Differential/Platelet; Future - Comprehensive metabolic panel; Future - TSH; Future  2. History of syncope-10/11/2019 Went to the ER on 10-11-19 with a fall at home between 4-7am. Intoxication was ruled out. CT and MRI of head was normal. Was discharged with suspicion of post ictal confusion. Had evaluation by Dr. Melrose Nakayama (neurologist) on 11-25-19 without specific pathology. Suspect combination of CHF, hypoxia and periodic hypotension. Will recheck labs and advised to monitor BP prior to taking Ziac. - CBC with Differential/Platelet; Future - Comprehensive metabolic panel; Future - TSH; Future  3. COPD with hypoxia ([place]) Using oxygen at 2-2.5 LPM daily and may turn it up to 3 LPM at night with CPAP. Pulse oximetry 93% in the office today on oxygen. No congestion or wheezing today. Recheck CBC. - CBC with Differential/Platelet; Future  4. Chronic diastolic heart failure (HCC) NYHA Class II CHF. Unchanged dyspnea with hypoxia and oxygen dependence. No peripheral edema or significant palpitations. Followed by Lysle Morales at the heart failure clinic and cardiologist (Dr. Clayborn Bigness). Last BNP was 108 on 07-09-19.Marland KitchenStill on Digoxin 0.25 mg qd and Torsemide 20 mg 2 tablets BID. Will recheck CBC, CMP and TSH. - CBC with Differential/Platelet; Future - Comprehensive metabolic  panel; Future - TSH; Future

## 2019-12-25 DIAGNOSIS — R404 Transient alteration of awareness: Secondary | ICD-10-CM | POA: Diagnosis not present

## 2019-12-25 DIAGNOSIS — R296 Repeated falls: Secondary | ICD-10-CM | POA: Diagnosis not present

## 2019-12-25 DIAGNOSIS — M545 Low back pain: Secondary | ICD-10-CM | POA: Diagnosis not present

## 2019-12-25 DIAGNOSIS — Z720 Tobacco use: Secondary | ICD-10-CM | POA: Diagnosis not present

## 2019-12-25 DIAGNOSIS — R29898 Other symptoms and signs involving the musculoskeletal system: Secondary | ICD-10-CM | POA: Diagnosis not present

## 2019-12-27 ENCOUNTER — Other Ambulatory Visit: Payer: Self-pay | Admitting: Family Medicine

## 2019-12-27 DIAGNOSIS — J441 Chronic obstructive pulmonary disease with (acute) exacerbation: Secondary | ICD-10-CM

## 2019-12-27 MED ORDER — DOXYCYCLINE HYCLATE 100 MG PO TABS: 100.0000 mg | ORAL_TABLET | Freq: Two times a day (BID) | ORAL | 0 refills | Status: DC

## 2019-12-27 MED ORDER — LEVOTHYROXINE SODIUM 25 MCG PO TABS: ORAL_TABLET | ORAL | 0 refills | Status: DC

## 2019-12-27 NOTE — Telephone Encounter (Signed)
Pt stated she was advised by the pharmacy to call office for a refill on her levothyroxine (SYNTHROID) 25 MCG tablet  She stated she was only given 30 tabs by the pharmacy when she picked up medication last month/ please advise   Pt also asked if Simona Huh can send in something for her / she has a cough with green phlegm

## 2019-12-27 NOTE — Telephone Encounter (Signed)
Recommend Mucinex-DM for cough and to loosen up sputum. Sent antibiotic to the [facility] with refill of the Levothyroxine. Need to get labs done to be sure change in dosage not needed.

## 2019-12-27 NOTE — Telephone Encounter (Signed)
Please advise message below concerning cough?

## 2019-12-30 DIAGNOSIS — G5601 Carpal tunnel syndrome, right upper limb: Secondary | ICD-10-CM | POA: Diagnosis not present

## 2019-12-30 NOTE — Telephone Encounter (Signed)
LMTCB, [place] Triage Nurse may give patient results

## 2019-12-30 NOTE — Telephone Encounter (Signed)
Pt. returned call.  Advised of recommendations by [name] on 3/12.  Reported she started on the antibiotic, and has noted that the color of phlegm is lighter and not as thick.  Was not aware of the recommendation to start Mucinex DM.  Stated she will start taking that, in addition to antibiotic. Stated she is feeling better.  Reported that she plans to repeat her Thyroid level at end of month.  All questions answered.

## 2019-12-31 DIAGNOSIS — R200 Anesthesia of skin: Secondary | ICD-10-CM | POA: Insufficient documentation

## 2020-01-08 ENCOUNTER — Encounter: Payer: Self-pay | Admitting: Pain Medicine

## 2020-01-12 NOTE — Progress Notes (Signed)
Patient: Theresa [name]  Service Category: E/M  Provider: Gaspar Cola, MD  DOB: 02-26-1956  DOS: 01/13/2020  Location: Office  MRN: [id number]  Setting: Ambulatory outpatient  Referring Provider: Margo Common, PA  Type: Established Patient  Specialty: Interventional Pain Management  PCP: Margo Common, PA  Location: Remote location  Delivery: TeleHealth     Virtual Encounter - Pain Management PROVIDER NOTE: Information contained herein reflects review and annotations entered in association with encounter. Interpretation of such information and data should be left to medically-trained personnel. Information provided to patient can be located elsewhere in the medical record under "Patient Instructions". Document created using STT-dictation technology, any transcriptional errors that may result from process are unintentional.    Contact & Pharmacy Preferred: [phone] Home: [phone] (home) Mobile: [phone] (mobile) E-mail: [email or web address] [place], [place] [address] [place] [address] Phone: [phone] Fax: [phone]  [id number] [id number]: [phone]: [phone]  Pre-screening  [name] "in-person" vs "virtual" encounter. She indicated preferring virtual for this encounter.   Reason COVID-19*  Social distancing based on CDC and AMA recommendations.   I contacted Theresa [name] 01/13/2020 via telephone.      I clearly identified myself as FGaspar Cola MD. I verified that I was speaking with the correct person using two identifiers (Name: Theresa [name] and date of birth: [birth year].  Consent I sought verbal advanced consent from Theresa [name] virtual visit interactions. I informed [name] possible security and privacy concerns, risks, and limitations associated  with providing "not-in-person" medical evaluation and management services. I also informed [name] the availability of "in-person" appointments. Finally, I informed her that there would be a charge for the virtual visit and that she could be  personally, fully or partially, financially responsible for it. [name] understanding and agreed to proceed.   Historic Elements   [name] a 64y.o. year old, female patient evaluated today after her last contact with our practice on 09/11/2019. [name] has a past medical history of Acute postoperative pain (10/03/2017), Anxiety, BiPAP (biphasic positive airway pressure) dependence, Carpal tunnel syndrome, CHF (congestive heart failure) ([place], CHF (congestive heart failure) ([place], Chronic generalized abdominal pain, Chronic rhinitis, COPD (chronic obstructive pulmonary disease) ([place], DDD (degenerative disc disease), cervical, DDD (degenerative disc disease), lumbosacral, Depression, Dyspnea, Edema, Flu, Gastritis, GERD (gastroesophageal reflux disease), Hematuria, Hernia of abdominal wall (07/17/2018), Hernia, abdominal, Hypertension, Kidney stones, Low back pain, Lumbar radiculitis, Malodorous urine, Muscle weakness, Obesity, Oxygen dependent, Renal cyst, Sensory urge incontinence, Thyroid activity decreased, Tobacco abuse, and Wheezing. She also  has a past surgical history that includes Abdominal hysterectomy; Tonsillectomy; Carpal tunnel release (Left, 2012); Tubal ligation; epigastric hernia repair (N/A, 08/13/2018); Umbilical hernia repair (N/A, 08/13/2018); and RIGHT/LEFT HEART CATH AND CORONARY ANGIOGRAPHY (N/A, 07/11/2019). [name] a current medication list which includes the following prescription(s): albuterol, alprazolam, aspirin ec, baclofen, bisoprolol-hydrochlorothiazide, cholecalciferol, digoxin, doxycycline, fluticasone-umeclidin-vilant, gabapentin, hydrocodone-acetaminophen, [START ON 02/12/2020]  hydrocodone-acetaminophen, [START ON 03/13/2020] hydrocodone-acetaminophen, ipratropium-albuterol, levothyroxine, potassium chloride sa, sertraline, torsemide, and torsemide. She  reports that she has been smoking cigarettes. She has a 44.00 pack-year smoking history. She has never used smokeless tobacco. She reports that she does not drink alcohol or use drugs. [name] allergic to  codeine and meloxicam.   HPI  Today, she is being contacted for medication management. The patient indicates doing well with the current medication regimen. No adverse reactions or side effects reported to the medications.  Today we have [facility] the [facility] to avoid the problems that we have been encountering with [facility] unreliability regarding having sufficient pain medications for the patients.  In addition, the patient mentioned that she recently had an appointment with Dr. Manuella Ghazi who recommended changing the dose on the gabapentin to 400 mg p.o. 3 times daily.  We have done so today.  According to her he initially attempted to bring her up to 600 mg, but she was unable to tolerate that.  She seems to be doing well at the 400 mg level and therefore will stay on that for the time being.  Pharmacotherapy Assessment  Analgesic: Hydrocodone/APAP 5/325 one tablet every 8 hours (15 mg/day of hydrocodone) MME/day:15 mg/day.   Monitoring: [place] PMP: PDMP reviewed during this encounter.       Pharmacotherapy: No side-effects or adverse reactions reported. Compliance: No problems identified. Effectiveness: Clinically acceptable. Plan: Refer to "POC".  UDS:  Summary  Date Value Ref Range Status  07/31/2018 FINAL  Final    Comment:    ==================================================================== TOXASSURE SELECT 13 (MW) ==================================================================== Test                             Result       Flag       Units Drug Present and  Declared for Prescription Verification   Hydrocodone                    688          EXPECTED   ng/mg creat   Norhydrocodone                 552          EXPECTED   ng/mg creat    Sources of hydrocodone include scheduled prescription    medications. Norhydrocodone is an expected metabolite of    hydrocodone. Drug Absent but Declared for Prescription Verification   Alprazolam                     Not Detected UNEXPECTED ng/mg creat ==================================================================== Test                      Result    Flag   Units      Ref Range   Creatinine              25               mg/dL      >=20 ==================================================================== Declared Medications:  The flagging and interpretation on this report are based on the  following declared medications.  Unexpected results may arise from  inaccuracies in the declared medications.  **Note: The testing scope of this panel includes these medications:  Alprazolam  Hydrocodone (Hydrocodone-Acetaminophen)  **Note: The testing scope of this panel does not include following  reported medications:  Acetaminophen (Hydrocodone-Acetaminophen)  Albuterol  Albuterol (Ipratropium-Albuterol)  Aspirin (Aspirin 81)  Bisoprolol (Ziac)  Fluticasone (Advair)  Gabapentin  Hydrochlorothiazide (Ziac)  Ipratropium (Ipratropium-Albuterol)  Levothyroxine  Oxygen  Potassium (K-Dur)  Ranitidine  Roflumilast  Salmeterol (Advair)  Sertraline  Torsemide ==================================================================== For clinical consultation, please call [phone]. ====================================================================  Laboratory Chemistry Profile   Renal Lab Results  Component Value Date   BUN 21 10/30/2019   CREATININE 0.80 10/30/2019   BCR 26 10/30/2019   GFRAA 91 10/30/2019   GFRNONAA 79 10/30/2019    Hepatic Lab Results  Component Value Date   AST 25 10/22/2019    ALT 24 10/22/2019   ALBUMIN 4.1 10/30/2019   ALKPHOS 131 (H) 10/22/2019   LIPASE 31 10/17/2017   AMMONIA 40 (H) 10/11/2019    Electrolytes Lab Results  Component Value Date   NA 141 10/30/2019   K 3.6 10/30/2019   CL 93 (L) 10/30/2019   CALCIUM 10.2 10/30/2019   MG 2.1 07/06/2019   PHOS 3.4 10/30/2019    Bone Lab Results  Component Value Date   25OHVITD1 13 (L) 09/17/2015   25OHVITD2 <1.0 09/17/2015   25OHVITD3 13 09/17/2015    Inflammation (CRP: Acute Phase) (ESR: Chronic Phase) Lab Results  Component Value Date   CRP 9.1 (H) 08/08/2017   ESRSEDRATE 16 08/08/2017   LATICACIDVEN 0.9 12/10/2017      Note: Above Lab results reviewed.  Imaging  DG Chest 2 View CLINICAL DATA:  Shortness of breath  EXAM: CHEST - 2 VIEW  COMPARISON:  07/06/2019  FINDINGS: Cardiac shadows within normal limits. The lungs are well aerated bilaterally. No focal infiltrate or sizable effusion is seen. Stable interstitial opacities are noted. No bony abnormality is seen.  IMPRESSION: Chronic interstitial markings stable from the prior exam.  Electronically Signed   By: Inez Catalina M.D.   On: 10/22/2019 23:12  Assessment  The primary encounter diagnosis was Chronic pain syndrome (significant psychosocial component). Diagnoses of Chronic low back pain (Primary Area of Pain) (Bilateral) (L>R), Chronic lower extremity pain (Secondary Area of Pain) (Left), Chronic hip pain (Tertiary Area of Pain) (Bilateral) (L>R), Left thigh pain, Neurogenic pain, and Neuropathic pain were also pertinent to this visit.  Plan of Care  Problem-specific:  No problem-specific Assessment & Plan notes found for this encounter.  Ms. Malinda Mayden has a current medication list which includes the following long-term medication(s): albuterol, baclofen, bisoprolol-hydrochlorothiazide, digoxin, gabapentin, hydrocodone-acetaminophen, [START ON 02/12/2020] hydrocodone-acetaminophen, [START ON 03/13/2020]  hydrocodone-acetaminophen, ipratropium-albuterol, levothyroxine, potassium chloride sa, sertraline, torsemide, and torsemide.  Pharmacotherapy (Medications Ordered): Meds ordered this encounter  Medications  . HYDROcodone-acetaminophen (NORCO/VICODIN) 5-325 MG tablet    Sig: Take 1 tablet by mouth every 8 (eight) hours as needed for severe pain. Must last 30 days    Dispense:  90 tablet    Refill:  0    Chronic Pain: STOP Act (Not applicable) Fill 1 day early if closed on refill date. Do not fill until: 01/13/2020. To last until: 02/12/2020. Avoid benzodiazepines within 8 hours of opioids  . HYDROcodone-acetaminophen (NORCO/VICODIN) 5-325 MG tablet    Sig: Take 1 tablet by mouth every 8 (eight) hours as needed for severe pain. Must last 30 days    Dispense:  90 tablet    Refill:  0    Chronic Pain: STOP Act (Not applicable) Fill 1 day early if closed on refill date. Do not fill until: 02/12/2020. To last until: 03/13/2020. Avoid benzodiazepines within 8 hours of opioids  . HYDROcodone-acetaminophen (NORCO/VICODIN) 5-325 MG tablet    Sig: Take 1 tablet by mouth every 8 (eight) hours as needed for severe pain. Must last 30 days    Dispense:  90 tablet    Refill:  0    Chronic Pain: STOP Act (Not applicable) Fill 1 day early if  closed on refill date. Do not fill until: 03/13/2020. To last until: 04/12/2020. Avoid benzodiazepines within 8 hours of opioids  . baclofen (LIORESAL) 10 MG tablet    Sig: Take 1 tablet (10 mg total) by mouth 3 (three) times daily.    Dispense:  90 tablet    Refill:  5    Fill one day early if pharmacy is closed on scheduled refill date. May substitute for generic if available.  . gabapentin (NEURONTIN) 400 MG capsule    Sig: Take 1 capsule (400 mg total) by mouth 3 (three) times daily.    Dispense:  90 capsule    Refill:  5    Fill one day early if pharmacy is closed on scheduled refill date. May substitute for generic if available.   Orders:  No orders of the defined  types were placed in this encounter.  Follow-up plan:   Return in about 3 months (around 04/08/2020) for (VV), (MM).      Interventional management options: Planned, scheduled, and/or pending: NOTE: NO further Lumbar RFAuntil BMI <35. Diagnostic left L2-3 LESI #1 today   Considering: Palliative procedures    Palliative PRN treatment(s): Palliativebilaterallumbar facetblock #4 Diagnostic/palliative left L4TFESI #2 Diagnostic/palliative left L4-5 LESI #3 Diagnostic/palliativerightL4 TFESI #2 Diagnostic/palliativerightL4-5 LESI #2 Palliative leftlumbar facet RFA#2(done on 06/27/2017) Palliative right lumbar facet RFA#2(done on 10/03/2017)      Recent Visits No visits were found meeting these conditions.  Showing recent visits within past 90 days and meeting all other requirements   Today's Visits Date Type Provider Dept  01/13/20 Telemedicine Milinda Pointer, MD [facility]  Showing today's visits and meeting all other requirements   Future Appointments No visits were found meeting these conditions.  Showing future appointments within next 90 days and meeting all other requirements   I discussed the assessment and treatment plan with the patient. The patient was provided an opportunity to ask questions and all were answered. The patient agreed with the plan and demonstrated an understanding of the instructions.  Patient advised to call back or seek an in-person evaluation if the symptoms or condition worsens.  Duration of encounter: 15 minutes.  Note by: Gaspar Cola, MD Date: 01/13/2020; Time: 10:19 AM

## 2020-01-13 ENCOUNTER — Ambulatory Visit: Payer: Medicare HMO | Attending: Pain Medicine | Admitting: Pain Medicine

## 2020-01-13 ENCOUNTER — Other Ambulatory Visit: Payer: Self-pay

## 2020-01-13 DIAGNOSIS — M792 Neuralgia and neuritis, unspecified: Secondary | ICD-10-CM | POA: Diagnosis not present

## 2020-01-13 DIAGNOSIS — M79652 Pain in left thigh: Secondary | ICD-10-CM

## 2020-01-13 DIAGNOSIS — G894 Chronic pain syndrome: Secondary | ICD-10-CM

## 2020-01-13 DIAGNOSIS — M25551 Pain in right hip: Secondary | ICD-10-CM | POA: Diagnosis not present

## 2020-01-13 DIAGNOSIS — M25552 Pain in left hip: Secondary | ICD-10-CM | POA: Diagnosis not present

## 2020-01-13 DIAGNOSIS — M79605 Pain in left leg: Secondary | ICD-10-CM | POA: Diagnosis not present

## 2020-01-13 DIAGNOSIS — G8929 Other chronic pain: Secondary | ICD-10-CM

## 2020-01-13 DIAGNOSIS — M545 Low back pain: Secondary | ICD-10-CM

## 2020-01-13 MED ORDER — BACLOFEN 10 MG PO TABS: 10.0000 mg | ORAL_TABLET | Freq: Three times a day (TID) | ORAL | 5 refills | Status: DC

## 2020-01-13 MED ORDER — GABAPENTIN 400 MG PO CAPS: 400.0000 mg | ORAL_CAPSULE | Freq: Three times a day (TID) | ORAL | 5 refills | Status: DC

## 2020-01-13 MED ORDER — HYDROCODONE-ACETAMINOPHEN 5-325 MG PO TABS: 1.0000 | ORAL_TABLET | Freq: Three times a day (TID) | ORAL | 0 refills | Status: DC | PRN

## 2020-01-19 IMAGING — CT CT ANGIO CHEST
2 of 7 series · 18 of 46 positions shown · IV contrast (APPLIED)
Comparison: Chest CT dated 03/25/2019.

CLINICAL DATA: Chest pain and worsening shortness of breath for 2
days. History of wheezing, hypertension, COPD, CHF, smoker.

EXAM:
CT ANGIOGRAPHY CHEST WITH CONTRAST
TECHNIQUE: Multidetector CT imaging of the chest was performed using the
standard protocol during bolus administration of intravenous
contrast. Multiplanar CT image reconstructions and MIPs were
obtained to evaluate the vascular anatomy.
CONTRAST:  75mL OMNIPAQUE IOHEXOL 350 MG/ML SOLN

[Series 6: thins · axial · 0.64mm/px · z∈[-885,-627]mm · 15 of 290 slices shown]
[im 16/290  lung]
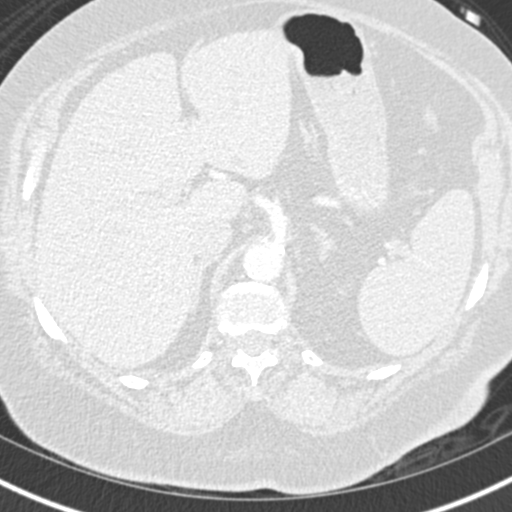
[im 31/290  soft-tissue]
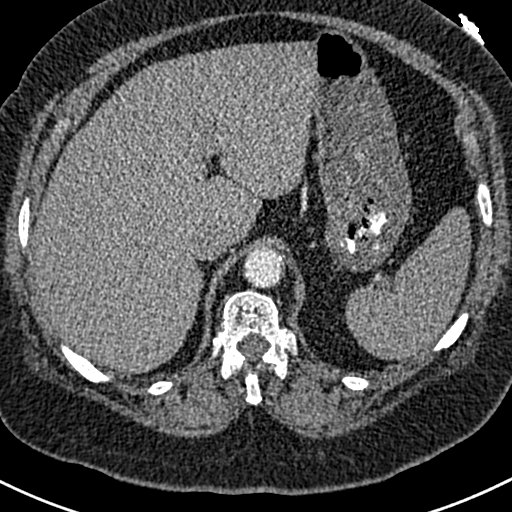
[im 61/290  lung]
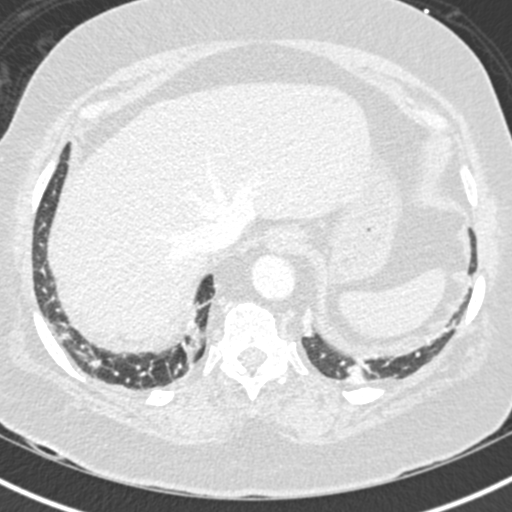
[im 77/290  soft-tissue]
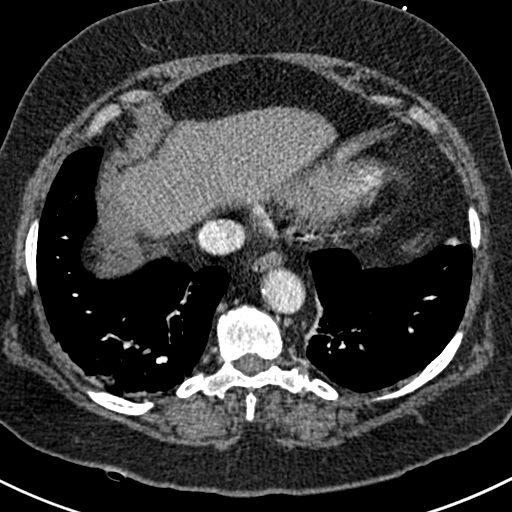
[im 92/290  lung]
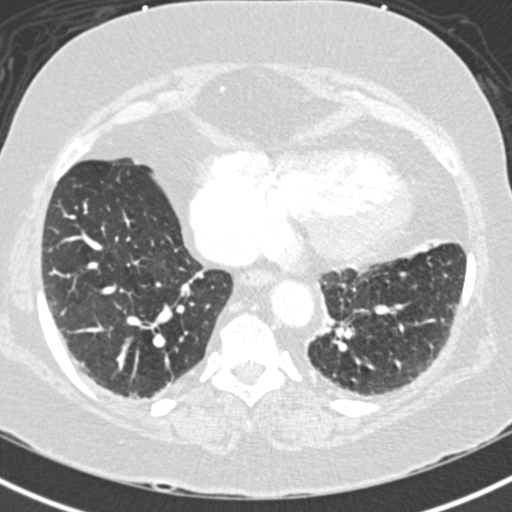
[im 107/290  soft-tissue]
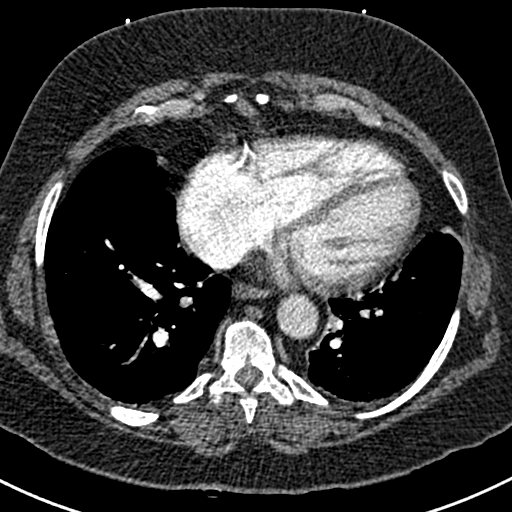
[im 122/290  lung]
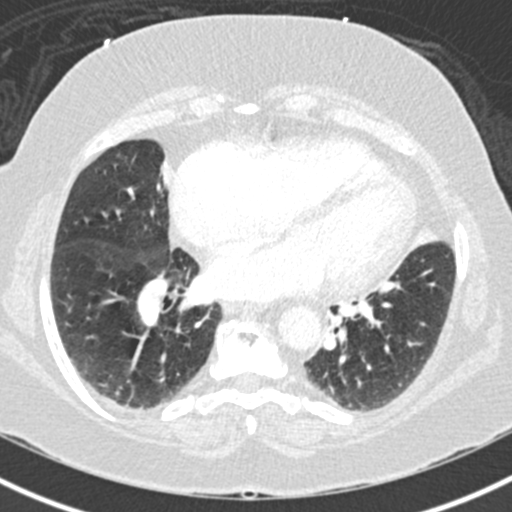
[im 153/290  soft-tissue]
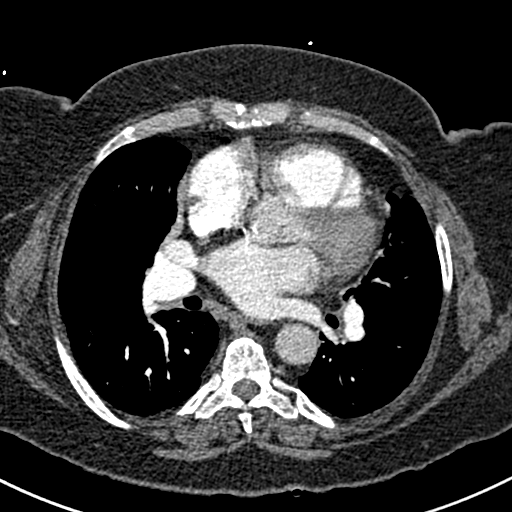
[im 168/290  lung]
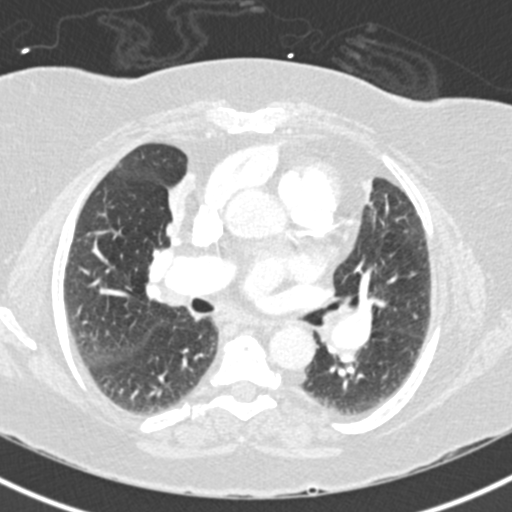
[im 183/290  soft-tissue]
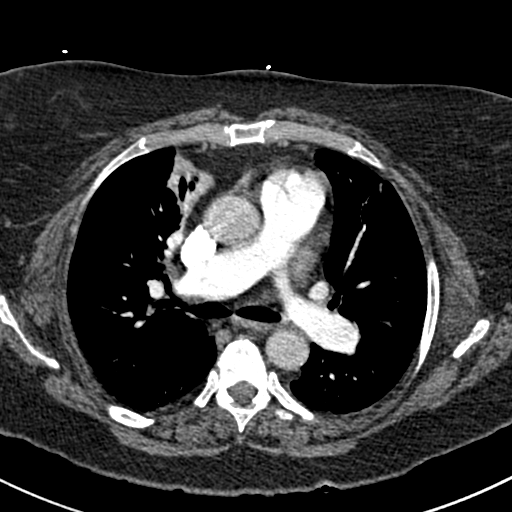
[im 198/290  lung]
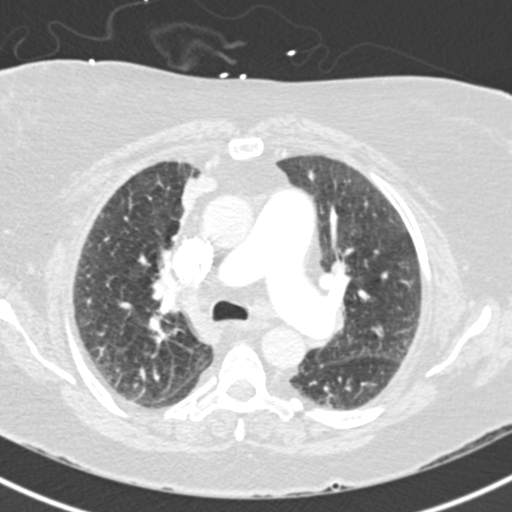
[im 213/290  soft-tissue]
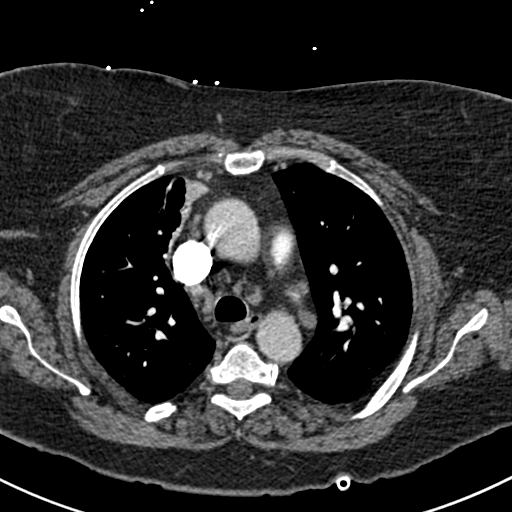
[im 244/290  lung]
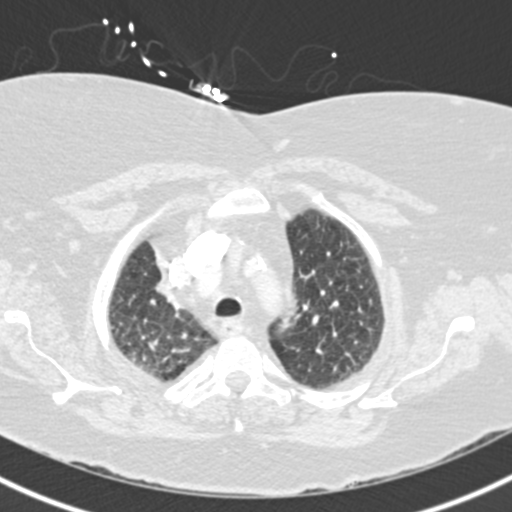
[im 259/290  soft-tissue]
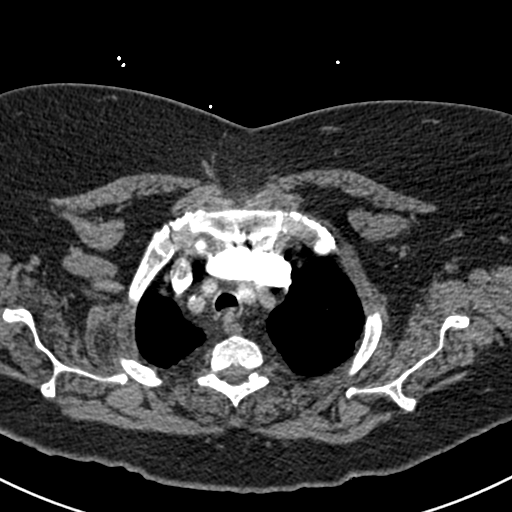
[im 274/290  lung]
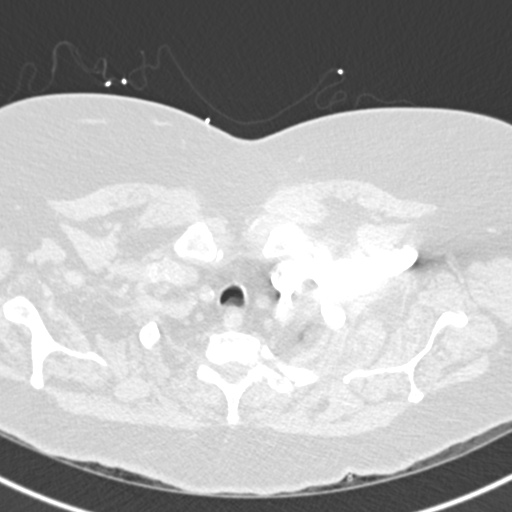

[Series 8: coronal mpr · coronal · 0.56mm/px · 3 of 81 slices shown]
[im 21/81  soft-tissue]
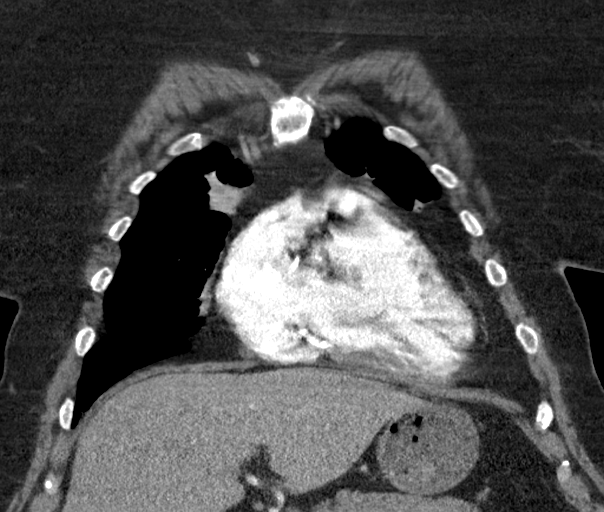
[im 41/81  soft-tissue]
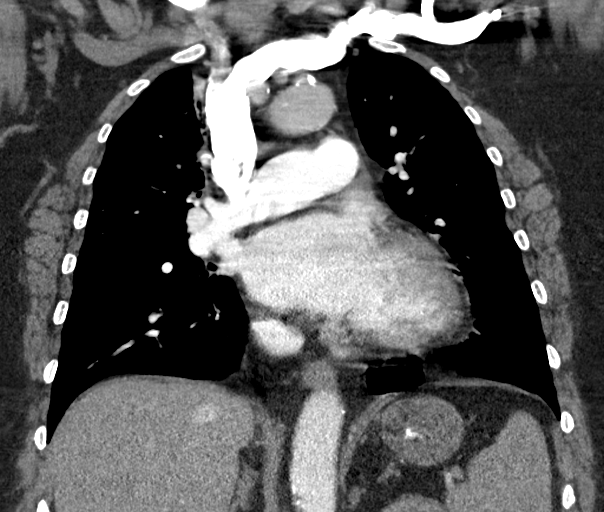
[im 61/81  soft-tissue]
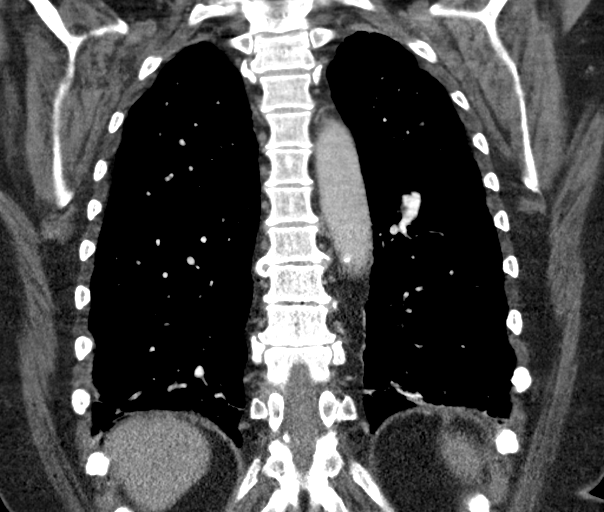

[18 of 46 positions shown; findings below may reference images not displayed]

FINDINGS: Cardiovascular: Some of the peripheral segmental and subsegmental
pulmonary artery branches are difficult to definitively characterize
due to patient breathing motion artifact, however, there is no
pulmonary embolism identified within the main, lobar or segmental
pulmonary arteries bilaterally.

No thoracic aortic aneurysm or evidence of aortic dissection. Mild
aortic atherosclerosis. Cardiomegaly. No pericardial effusion.
Diffuse coronary artery calcifications, particularly dense within
the LEFT anterior descending coronary artery.

Mediastinum/Nodes: No mass or enlarged lymph nodes seen within the
mediastinum or perihilar regions. Esophagus appears normal. Trachea
appears normal.

Lungs/Pleura: Mild bibasilar atelectasis/scarring. Mild
centrilobular and paraseptal emphysema. Stable linear consolidations
within the medial aspects of each lung suggesting chronic GIORGI. No
evidence of acute pneumonia or pulmonary edema.

Upper Abdomen: No acute findings on limited images of the upper
abdomen. Previously described cysts within the liver, again too
small to definitively characterize.

Musculoskeletal: No acute or suspicious osseous finding. Mild
degenerative spurring within the thoracic spine.

Review of the MIP images confirms the above findings.
IMPRESSION: 1. No pulmonary embolism seen, with mild study limitations detailed
above.
2. Cardiomegaly. No pericardial effusion.
3. Diffuse coronary artery calcifications, particularly dense within
the LEFT anterior descending coronary artery. Recommend correlation
with any possible associated cardiac symptoms.
4. Chronic/incidental lung findings detailed above. No evidence of
active pneumonia or pulmonary edema.

Aortic Atherosclerosis (2UFBB-H88.8) and Emphysema (2UFBB-H9W.O).

## 2020-01-21 DIAGNOSIS — J9610 Chronic respiratory failure, unspecified whether with hypoxia or hypercapnia: Secondary | ICD-10-CM | POA: Diagnosis not present

## 2020-01-21 NOTE — Progress Notes (Addendum)
Subjective:   Theresa [name] is a 64 y.o. female who presents for Medicare Annual (Subsequent) preventive examination.    This visit is being conducted through telemedicine due to the COVID-19 pandemic. This patient has given me verbal consent via doximity to conduct this visit, patient states they are participating from their home address. Some vital signs may be absent or patient reported.    Patient identification: identified by name, DOB, and current address  Review of Systems:  N/A  Cardiac Risk Factors include: smoking/ tobacco exposure;hypertension;obesity (BMI >30kg/m2)     Objective:     Vitals: There were no vitals taken for this visit.  There is no height or weight on file to calculate BMI. Unable to obtain vitals due to visit being conducted via telephonically.   Advanced Directives 01/22/2020 10/11/2019 07/30/2019 07/23/2019 07/16/2019 07/14/2019 07/11/2019  Does Patient Have a Medical Advance Directive? _0  No No  Type of Advance Directive - - - - - - -  Does patient want to make changes to medical advance directive? - - - - - - -  Copy of [place] in Chart? - - - - - - -  Would patient like information on creating a medical advance directive? No - Patient declined No - Patient declined - No - Patient declined No - Patient declined - No - Patient declined  Some encounter information is confidential and restricted. Go to Review Flowsheets activity to see all data.    Tobacco Social History   Tobacco Use  Smoking Status Current Every Day Smoker   Packs/day: 0.50   Years: 44.00   Pack years: 22.00   Types: Cigarettes  Smokeless Tobacco Never Used  Tobacco Comment   over a half pack     Ready to quit: No Counseling given: No Comment: over a half pack   Clinical Intake:  Pre-visit preparation completed: Yes  Pain : 0-10 Pain Score: 8  Pain Type: Chronic pain Pain Location: Back Pain Orientation: Lower Pain Descriptors /  Indicators: Aching Pain Frequency: Intermittent     Nutritional Risks: None Diabetes: No  How often do you need to have someone help you when you read instructions, pamphlets, or other written materials from your doctor or pharmacy?: 1 - Never  Interpreter Needed?: No  Information entered by :: [facility], LPN  Past Medical History:  Diagnosis Date   Acute postoperative pain 10/03/2017   Anxiety    BiPAP (biphasic positive airway pressure) dependence    at hs   Carpal tunnel syndrome    CHF (congestive heart failure) ([place])    1/18   CHF (congestive heart failure) ([facility])    Chronic generalized abdominal pain    Chronic rhinitis    COPD (chronic obstructive pulmonary disease) ([facility])    DDD (degenerative disc disease), cervical    DDD (degenerative disc disease), lumbosacral    Depression    Dyspnea    Edema    Flu    1/18   Gastritis    GERD (gastroesophageal reflux disease)    Hematuria    Hernia of abdominal wall 07/17/2018   Hernia, abdominal    Hypertension    Kidney stones    Low back pain    Lumbar radiculitis    Malodorous urine    Muscle weakness    Obesity    Oxygen dependent    Renal cyst    Sensory urge incontinence    Thyroid activity decreased  9/19   Tobacco abuse    Wheezing    Past Surgical History:  Procedure Laterality Date   ABDOMINAL HYSTERECTOMY     CARPAL TUNNEL RELEASE Left 2012   EPIGASTRIC HERNIA REPAIR N/A 08/13/2018   HERNIA REPAIR EPIGASTRIC ADULT; polypropylene mesh reinforcement.  Surgeon: Robert Bellow, MD;  Location: [facility];  Service: General;  Laterality: N/A;   RIGHT/LEFT HEART CATH AND CORONARY ANGIOGRAPHY N/A 07/11/2019   Procedure: RIGHT/LEFT HEART CATH AND CORONARY ANGIOGRAPHY;  Surgeon: Yolonda Kida, MD;  Location: [facility];  Service: Cardiovascular;  Laterality: N/A;   TONSILLECTOMY     TUBAL LIGATION     UMBILICAL HERNIA REPAIR N/A 08/13/2018   HERNIA REPAIR UMBILICAL ADULT; polypropylene  mesh reinforcement Surgeon: Robert Bellow, MD;  Location: [facility];  Service: General;  Laterality: N/A;   Family History  Problem Relation Age of Onset   Heart disease Mother    Stroke Mother    Coronary artery disease Mother    Lung cancer Sister    Cancer Sister    Breast cancer Sister    Alcohol abuse Father    Heart disease Father    Cancer Brother    Cancer Brother    Pneumonia Brother    Prostate cancer Neg Hx    Kidney cancer Neg Hx    Bladder Cancer Neg Hx    Social History   Socioeconomic History   Marital status: Married    Spouse name: Ovid Curd   Number of children: 2   Years of education: Not on file   Highest education level: 12th grade  Occupational History   Occupation: disability  Tobacco Use   Smoking status: Current Every Day Smoker    Packs/day: 0.50    Years: 44.00    Pack years: 22.00    Types: Cigarettes   Smokeless tobacco: Never Used   Tobacco comment: over a half pack  Substance and Sexual Activity   Alcohol use: No   Drug use: No   Sexual activity: Not Currently    Birth control/protection: Surgical  Other Topics Concern   Not on file  Social History Narrative   ** Merged History Encounter **       Lives with spouse    Social Determinants of Health   Financial Resource Strain: Medium Risk   Difficulty of Paying Living Expenses: Somewhat hard  Food Insecurity: No Food Insecurity   Worried About Charity fundraiser in the Last Year: Never true   Ran Out of Food in the Last Year: Never true  Transportation Needs: No Transportation Needs   Lack of Transportation (Medical): No   Lack of Transportation (Non-Medical): No  Physical Activity: Inactive   Days of Exercise per Week: 0 days   Minutes of Exercise per Session: 0 min  Stress: No Stress Concern Present   Feeling of Stress : Only a little  Social Connections: Slightly Isolated   Frequency of Communication with Friends and Family: More than three times a week   Frequency of  Social Gatherings with Friends and Family: Once a week   Attends Religious Services: More than 4 times per year   Active Member of [employer] or Organizations: No   Attends Archivist Meetings: Never   Marital Status: Married    Outpatient Encounter Medications as of 01/22/2020  Medication Sig   albuterol (VENTOLIN HFA) 108 (90 Base) MCG/ACT inhaler Inhale 2 puffs into the lungs every 6 (six) hours as needed for  wheezing or shortness of breath.   ALPRAZolam (XANAX) 0.5 MG tablet Take 1 tablet (0.5 mg total) by mouth 2 (two) times daily as needed for anxiety.   aspirin EC 81 MG tablet Take 81 mg by mouth daily.   baclofen (LIORESAL) 10 MG tablet Take 1 tablet (10 mg total) by mouth 3 (three) times daily.   Cholecalciferol ([employer] D3) 2000 units CAPS Take 2,000 Units by mouth daily.    digoxin (LANOXIN) 0.25 MG tablet TAKE 1 TABLET(0.25 MG) BY MOUTH DAILY   Fluticasone-Umeclidin-Vilant 100-62.5-25 MCG/INH AEPB Inhale 1 puff into the lungs daily.   gabapentin (NEURONTIN) 400 MG capsule Take 1 capsule (400 mg total) by mouth 3 (three) times daily.   HYDROcodone-acetaminophen (NORCO/VICODIN) 5-325 MG tablet Take 1 tablet by mouth every 8 (eight) hours as needed for severe pain. Must last 30 days   ipratropium-albuterol (DUONEB) 0.5-2.5 (3) MG/3ML SOLN USE 1 VIAL VIA NEBULIZER EVERY 6 HOURS AS NEEDED FOR SHORTNESS OF BREATH OR WHEEZING   potassium chloride SA (KLOR-CON) 20 MEQ tablet TAKE 1 TABLET(20 MEQ) BY MOUTH TWICE DAILY   sertraline (ZOLOFT) 100 MG tablet TAKE 2 TABLETS BY MOUTH EVERY MORNING   torsemide (DEMADEX) 20 MG tablet TAKE 2 TABLETS(40 MG) BY MOUTH TWICE DAILY   bisoprolol-hydrochlorothiazide (ZIAC) 10-6.25 MG tablet TAKE 1 TABLET BY MOUTH DAILY (Patient not taking: Reported on 01/22/2020)   doxycycline (VIBRA-TABS) 100 MG tablet Take 1 tablet (100 mg total) by mouth 2 (two) times daily. (Patient not taking: Reported on 01/22/2020)   [START ON 02/12/2020] HYDROcodone-acetaminophen  (NORCO/VICODIN) 5-325 MG tablet Take 1 tablet by mouth every 8 (eight) hours as needed for severe pain. Must last 30 days (Patient not taking: Reported on 01/22/2020)   [START ON 03/13/2020] HYDROcodone-acetaminophen (NORCO/VICODIN) 5-325 MG tablet Take 1 tablet by mouth every 8 (eight) hours as needed for severe pain. Must last 30 days (Patient not taking: Reported on 01/22/2020)   levothyroxine (SYNTHROID) 25 MCG tablet TAKE 1 TABLET(25 MCG) BY MOUTH DAILY BEFORE BREAKFAST (Patient not taking: Reported on 01/22/2020)   torsemide (DEMADEX) 20 MG tablet TAKE 2 TABLETS(40 MG) BY MOUTH TWICE DAILY (Patient not taking: Reported on 01/08/2020)   No facility-administered encounter medications on file as of 01/22/2020.    Activities of Daily Living In your present state of health, do you have any difficulty performing the following activities: 01/22/2020 07/06/2019  Hearing? N N  Vision? Y N  Comment Has a cataract on the right eye. -  Difficulty concentrating or making decisions? N N  Walking or climbing stairs? N N  Dressing or bathing? N N  Doing errands, shopping? N N  Preparing Food and eating ? N -  Using the Toilet? N -  In the past six months, have you accidently leaked urine? Y -  Comment Has urine leakage every night. -  Do you have problems with loss of bowel control? N -  Managing your Medications? N -  Managing your Finances? N -  Housekeeping or managing your Housekeeping? N -  Some encounter information is confidential and restricted. Go to Review Flowsheets activity to see all data.  Some recent data might be hidden    Patient Care Team: Chrismon, Vickki Muff, PA as PCP - General (Family Medicine) [name], FNP as Nurse Practitioner (Cardiology) Yolonda Kida, MD as Consulting Physician (Cardiology) Erby Pian, MD as Referring Physician (Pulmonary Disease) Harlin Heys, MD as Consulting Physician (Obstetrics and Gynecology) Milinda Pointer, MD as Referring  Physician (Pain Medicine) Idelle Leech, OD as Consulting Physician (Optometry) Anabel Bene, MD as Referring Physician (Neurology) Meade Maw, MD as Consulting Physician (Neurosurgery)    Assessment:   This is a routine wellness examination for [facility].  Exercise Activities and Dietary recommendations Current Exercise Habits: The patient does not participate in regular exercise at present, Exercise limited by: orthopedic condition(s)  Goals      Diet     Recommend current diet plan of eating baked foods, avoiding sugars and drinking lots of water.      LIFESTYLE - DECREASE FALLS RISK     Recommend to remove any items from the home that may cause slips or trips.        Fall Risk: Fall Risk  01/22/2020 09/10/2019 07/30/2019 07/18/2019 05/02/2018  Falls in the past year? 1 0 1 1 -  Comment - last fall 4 weeks ago, uses walker - - -  Number falls in past yr: 1 1 0 1 -  Injury with Fall? 1 0 1 0 -  Comment - - - - scrapes  Risk Factor Category  - - - - -  Risk for fall due to : - Impaired balance/gait;Impaired mobility - - -  Risk for fall due to: Comment - - - - "Im not sure"  Follow up Falls prevention discussed - - - -  Some encounter information is confidential and restricted. Go to Review Flowsheets activity to see all data.    FALL RISK PREVENTION PERTAINING TO THE HOME:  Any stairs in or around the home? Yes  If so, are there any without handrails? No   Home free of loose throw rugs in walkways, pet beds, electrical cords, etc? Yes  Adequate lighting in your home to reduce risk of falls? Yes   ASSISTIVE DEVICES UTILIZED TO PREVENT FALLS:  Life alert? No  Use of a cane, walker or w/c? No  Grab bars in the bathroom? Yes  Shower chair or bench in shower? Yes  Elevated toilet seat or a handicapped toilet? No    TIMED UP AND GO:  Was the test performed? No .    Depression Screen PHQ 2/9 Scores 01/22/2020 10/30/2019 11/08/2017 11/08/2017  PHQ - 2 Score 0 6  0 1  PHQ- 9 Score - 21 - -  Exception Documentation - - - -  Some encounter information is confidential and restricted. Go to Review Flowsheets activity to see all data.     Cognitive Function: Declined today.         Immunization History  Administered Date(s) Administered   Influenza Split 09/02/2014   Influenza,inj,Quad PF,6+ Mos 07/01/2015, 08/24/2017, 07/13/2018, 07/07/2019   Influenza-Unspecified 08/29/2016   Pneumococcal Polysaccharide-23 07/01/2015, 11/03/2016   Pneumococcal-Unspecified 07/24/2011    Qualifies for Shingles Vaccine? Yes . Due for Shingrix. Pt has been advised to call insurance company to determine out of pocket expense. Advised may also receive vaccine at local pharmacy or Health Dept. Verbalized acceptance and understanding.  Tdap: Although this vaccine is not a covered service during a Wellness Exam, does the patient still wish to receive this vaccine today?  No . Advised may receive this vaccine at local pharmacy or Health Dept. Aware to provide a copy of the vaccination record if obtained from local pharmacy or Health Dept. Verbalized acceptance and understanding.  Flu Vaccine: Up to date  Pneumococcal Vaccine: Completed series  Screening Tests Health Maintenance  Topic Date Due   TETANUS/TDAP  01/21/2021 (Originally 05/22/1975)  INFLUENZA VACCINE  05/17/2020   MAMMOGRAM  06/22/2020   PAP SMEAR-Modifier  04/13/2021   COLONOSCOPY  01/22/2024   Hepatitis C Screening  Completed   HIV Screening  Completed    Cancer Screenings:  Colorectal Screening: Completed 01/21/14. Repeat every 10 years.   Mammogram: Completed 06/22/18. Repeat every 1-2 years as advised.    Lung Cancer Screening: (Low Dose CT Chest recommended if Age 61-80 years, 30 pack-year currently smoking OR have quit w/in 15years.) does qualify however completed this 10/22/19. Repeat yearly.   Additional Screening:  Hepatitis C Screening: Up to date  Vision Screening: Recommended annual  ophthalmology exams for early detection of glaucoma and other disorders of the eye.  Dental Screening: Recommended annual dental exams for proper oral hygiene  Community Resource Referral:  CRR required this visit?  No       Plan:  I have personally reviewed and addressed the Medicare Annual Wellness questionnaire and have noted the following in the patient's chart:  A. Medical and social history B. Use of alcohol, tobacco or illicit drugs  C. Current medications and supplements D. Functional ability and status E.  Nutritional status F.  Physical activity G. Advance directives H. List of other physicians I.  Hospitalizations, surgeries, and ER visits in previous 12 months J.  [place] such as hearing and vision if needed, cognitive and depression L. Referrals and appointments   In addition, I have reviewed and discussed with patient certain preventive protocols, quality metrics, and best practice recommendations. A written personalized care plan for preventive services as well as general preventive health recommendations were provided to patient. Nurse Health Advisor  Signed,    Elisa Kutner [place], [place]  05/24/9168 Nurse Health Advisor   Nurse Notes: None.  Reviewed screening note from [place]. Agree with documentation and recommendations.

## 2020-01-22 ENCOUNTER — Other Ambulatory Visit: Payer: Self-pay

## 2020-01-22 ENCOUNTER — Ambulatory Visit (INDEPENDENT_AMBULATORY_CARE_PROVIDER_SITE_OTHER): Payer: Medicare HMO

## 2020-01-22 DIAGNOSIS — Z0000 Encounter for general adult medical examination without abnormal findings: Secondary | ICD-10-CM | POA: Diagnosis not present

## 2020-01-22 NOTE — Patient Instructions (Signed)
Theresa [name] , Thank you for taking time to come for your Medicare Wellness Visit. I appreciate your ongoing commitment to your health goals. Please review the following plan we discussed and let me know if I can assist you in the future.   Screening recommendations/referrals: Colonoscopy: Up to date, due 01/2024 Mammogram: Up to date, due 06/2020 Recommended yearly ophthalmology/optometry visit for glaucoma screening and checkup Recommended yearly dental visit for hygiene and checkup  Vaccinations: Influenza vaccine: Up to date Tdap vaccine: Pt declines today.  Shingles vaccine: Pt declines today.     Advanced directives: Advance directive discussed with you today. Even though you declined this today please call our office should you change your mind and we can give you the proper paperwork for you to fill out.  Conditions/risks identified: Smoking cessation and fall risk preventives discussed today.  Next appointment: 03/23/20 @ 10:00 AM with [name], Female Preventive care refers to lifestyle choices and visits with your health care provider that can promote health and wellness. What does preventive care include?  A yearly physical exam. This is also called an annual well check.  Dental exams once or twice a year.  Routine eye exams. Ask your health care provider how often you should have your eyes checked.  Personal lifestyle choices, including:  Daily care of your teeth and gums.  Regular physical activity.  Eating a healthy diet.  Avoiding tobacco and drug use.  Limiting alcohol use.  Practicing safe sex.  Taking low-dose aspirin daily starting at age 46.  Taking vitamin and mineral supplements as recommended by your health care provider. What happens during an annual well check? The services and screenings done by your health care provider during your annual well check will depend on your age, overall health, lifestyle risk  factors, and family history of disease. Counseling  Your health care provider may ask you questions about your:  Alcohol use.  Tobacco use.  Drug use.  Emotional well-being.  Home and relationship well-being.  Sexual activity.  Eating habits.  Work and work Statistician.  Method of birth control.  Menstrual cycle.  Pregnancy history. Screening  You may have the following tests or measurements:  Height, weight, and BMI.  Blood pressure.  Lipid and cholesterol levels. These may be checked every 5 years, or more frequently if you are over 25 years old.  Skin check.  Lung cancer screening. You may have this screening every year starting at age 57 if you have a 30-pack-year history of smoking and currently smoke or have quit within the past 15 years.  Fecal occult blood test (FOBT) of the stool. You may have this test every year starting at age 9.  Flexible sigmoidoscopy or colonoscopy. You may have a sigmoidoscopy every 5 years or a colonoscopy every 10 years starting at age 64.  Hepatitis C blood test.  Hepatitis B blood test.  Sexually transmitted disease (STD) testing.  Diabetes screening. This is done by checking your blood sugar (glucose) after you have not eaten for a while (fasting). You may have this done every 1-3 years.  Mammogram. This may be done every 1-2 years. Talk to your health care provider about when you should start having regular mammograms. This may depend on whether you have a family history of breast cancer.  BRCA-related cancer screening. This may be done if you have a family history of breast, ovarian, tubal, or peritoneal cancers.  Pelvic exam and Pap test. This may  be done every 3 years starting at age 17. Starting at age 66, this may be done every 5 years if you have a Pap test in combination with an HPV test.  Bone density scan. This is done to screen for osteoporosis. You may have this scan if you are at high risk for  osteoporosis. Discuss your test results, treatment options, and if necessary, the need for more tests with your health care provider. Vaccines  Your health care provider may recommend certain vaccines, such as:  Influenza vaccine. This is recommended every year.  Tetanus, diphtheria, and acellular pertussis (Tdap, Td) vaccine. You may need a Td booster every 10 years.  Zoster vaccine. You may need this after age 51.  Pneumococcal 13-valent conjugate (PCV13) vaccine. You may need this if you have certain conditions and were not previously vaccinated.  Pneumococcal polysaccharide (PPSV23) vaccine. You may need one or two doses if you smoke cigarettes or if you have certain conditions. Talk to your health care provider about which screenings and vaccines you need and how often you need them. This information is not intended to replace advice given to you by your health care provider. Make sure you discuss any questions you have with your health care provider. Document Released: 10/30/2015 Document Revised: 06/22/2016 Document Reviewed: 08/04/2015 Elsevier Interactive Patient Education  2017 [place] Prevention in the Home Falls can cause injuries. They can happen to people of all ages. There are many things you can do to make your home safe and to help prevent falls. What can I do on the outside of my home?  Regularly fix the edges of walkways and driveways and fix any cracks.  Remove anything that might make you trip as you walk through a door, such as a raised step or threshold.  Trim any bushes or trees on the path to your home.  Use bright outdoor lighting.  Clear any walking paths of anything that might make someone trip, such as rocks or tools.  Regularly check to see if handrails are loose or broken. Make sure that both sides of any steps have handrails.  Any raised decks and porches should have guardrails on the edges.  Have any leaves, snow, or ice cleared  regularly.  Use sand or salt on walking paths during winter.  Clean up any spills in your garage right away. This includes oil or grease spills. What can I do in the bathroom?  Use night lights.  Install grab bars by the toilet and in the tub and shower. Do not use towel bars as grab bars.  Use non-skid mats or decals in the tub or shower.  If you need to sit down in the shower, use a plastic, non-slip stool.  Keep the floor dry. Clean up any water that spills on the floor as soon as it happens.  Remove soap buildup in the tub or shower regularly.  Attach bath mats securely with double-sided non-slip rug tape.  Do not have throw rugs and other things on the floor that can make you trip. What can I do in the bedroom?  Use night lights.  Make sure that you have a light by your bed that is easy to reach.  Do not use any sheets or blankets that are too big for your bed. They should not hang down onto the floor.  Have a firm chair that has side arms. You can use this for support while you get dressed.  Do  not have throw rugs and other things on the floor that can make you trip. What can I do in the kitchen?  Clean up any spills right away.  Avoid walking on wet floors.  Keep items that you use a lot in easy-to-reach places.  If you need to reach something above you, use a strong step stool that has a grab bar.  Keep electrical cords out of the way.  Do not use floor polish or wax that makes floors slippery. If you must use wax, use non-skid floor wax.  Do not have throw rugs and other things on the floor that can make you trip. What can I do with my stairs?  Do not leave any items on the stairs.  Make sure that there are handrails on both sides of the stairs and use them. Fix handrails that are broken or loose. Make sure that handrails are as long as the stairways.  Check any carpeting to make sure that it is firmly attached to the stairs. Fix any carpet that is loose  or worn.  Avoid having throw rugs at the top or bottom of the stairs. If you do have throw rugs, attach them to the floor with carpet tape.  Make sure that you have a light switch at the top of the stairs and the bottom of the stairs. If you do not have them, ask someone to add them for you. What else can I do to help prevent falls?  Wear shoes that:  Do not have high heels.  Have rubber bottoms.  Are comfortable and fit you well.  Are closed at the toe. Do not wear sandals.  If you use a stepladder:  Make sure that it is fully opened. Do not climb a closed stepladder.  Make sure that both sides of the stepladder are locked into place.  Ask someone to hold it for you, if possible.  Clearly mark and make sure that you can see:  Any grab bars or handrails.  First and last steps.  Where the edge of each step is.  Use tools that help you move around (mobility aids) if they are needed. These include:  Canes.  Walkers.  Scooters.  Crutches.  Turn on the lights when you go into a dark area. Replace any light bulbs as soon as they burn out.  Set up your furniture so you have a clear path. Avoid moving your furniture around.  If any of your floors are uneven, fix them.  If there are any pets around you, be aware of where they are.  Review your medicines with your doctor. Some medicines can make you feel dizzy. This can increase your chance of falling. Ask your doctor what other things that you can do to help prevent falls. This information is not intended to replace advice given to you by your health care provider. Make sure you discuss any questions you have with your health care provider. Document Released: 07/30/2009 Document Revised: 03/10/2016 Document Reviewed: 11/07/2014 Elsevier Interactive Patient Education  2017 [employer].

## 2020-01-27 DIAGNOSIS — I208 Other forms of angina pectoris: Secondary | ICD-10-CM | POA: Diagnosis not present

## 2020-01-27 DIAGNOSIS — I25118 Atherosclerotic heart disease of native coronary artery with other forms of angina pectoris: Secondary | ICD-10-CM | POA: Diagnosis not present

## 2020-01-27 DIAGNOSIS — R0602 Shortness of breath: Secondary | ICD-10-CM | POA: Diagnosis not present

## 2020-01-27 DIAGNOSIS — I5032 Chronic diastolic (congestive) heart failure: Secondary | ICD-10-CM | POA: Diagnosis not present

## 2020-01-27 DIAGNOSIS — E782 Mixed hyperlipidemia: Secondary | ICD-10-CM | POA: Diagnosis not present

## 2020-01-27 DIAGNOSIS — J439 Emphysema, unspecified: Secondary | ICD-10-CM | POA: Diagnosis not present

## 2020-01-28 IMAGING — CR DG HIP (WITH OR WITHOUT PELVIS) 2-3V*L*
1 series · 3 of 3 positions shown · non-contrast
Comparison: Left hip radiograph dated 12/29/2015

CLINICAL DATA: 63-year-old female with back pain.

EXAM:
LUMBAR SPINE - 2-3 VIEW; DG HIP (WITH OR WITHOUT PELVIS) 2-3V LEFT

[Series 1: dg hip unilat w or w/o pelvis 2-3 views  · non-contrast · 0.14mm/px · 3 of 3 slices shown]
[im 1/3]
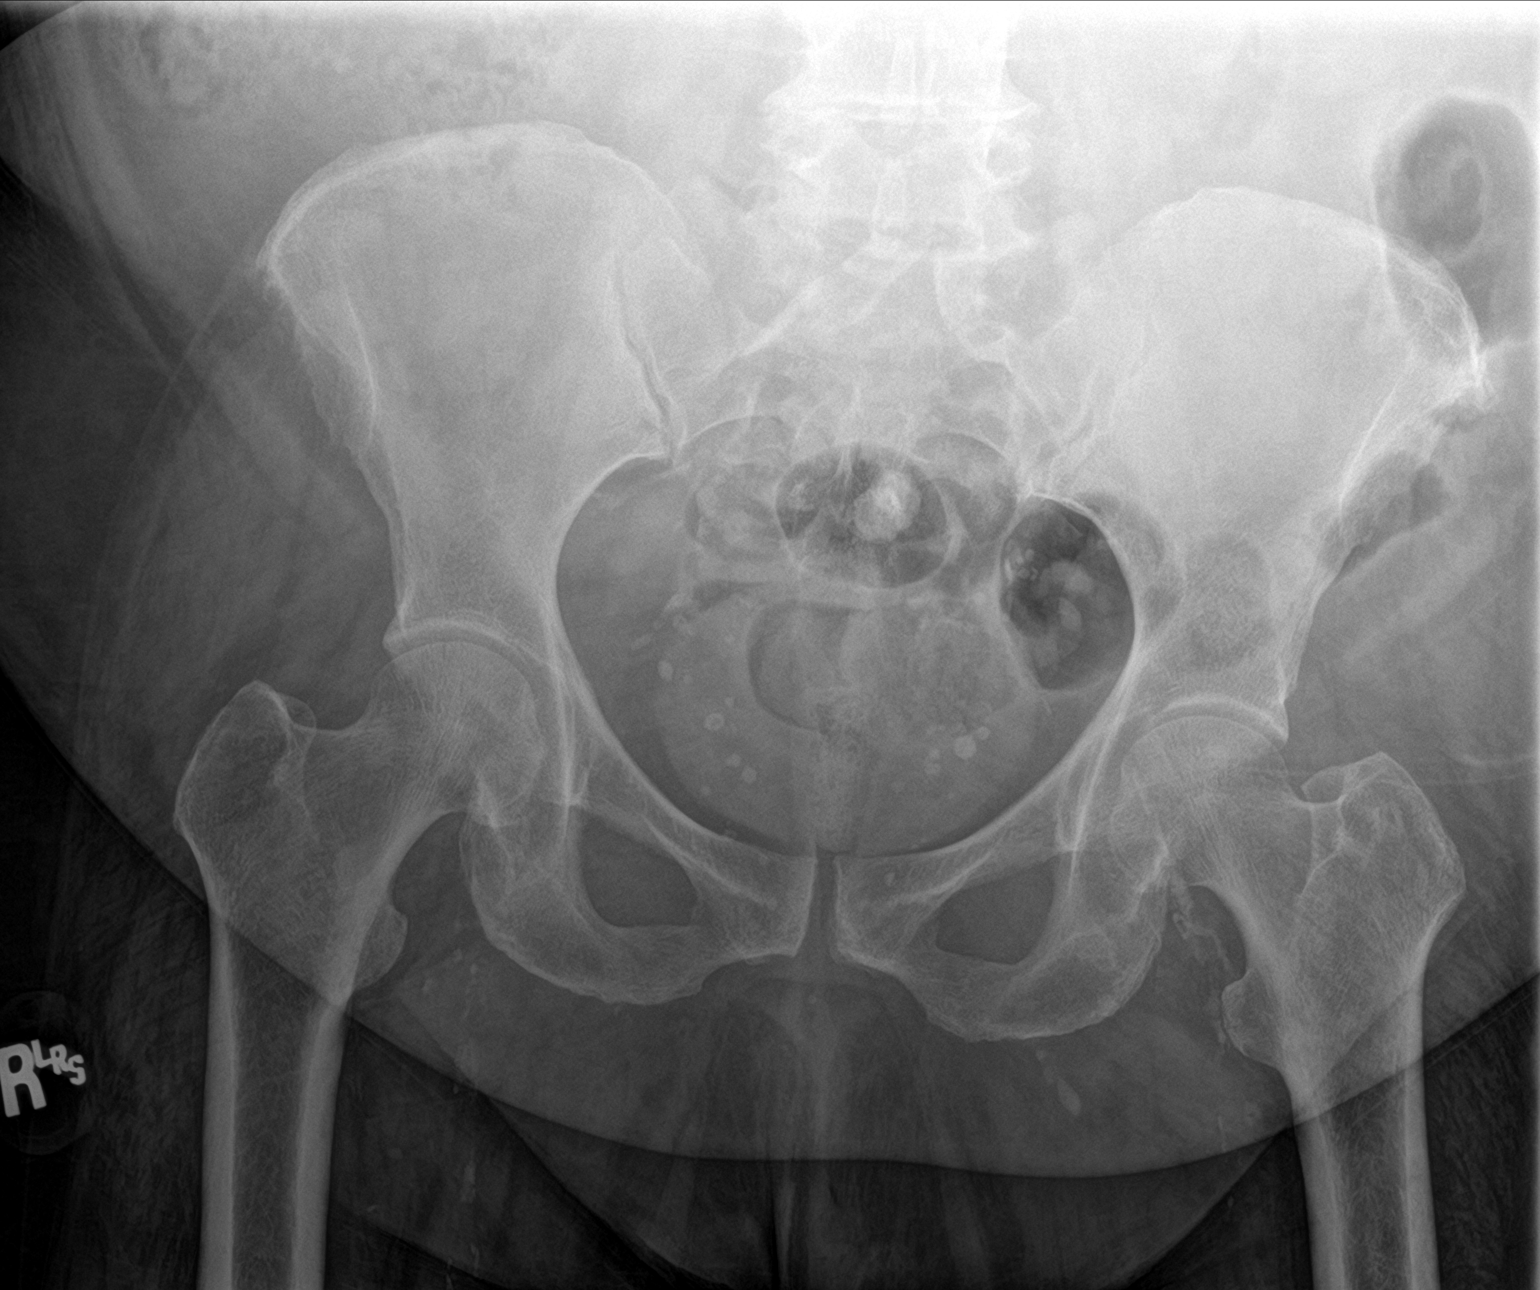
[im 2/3]
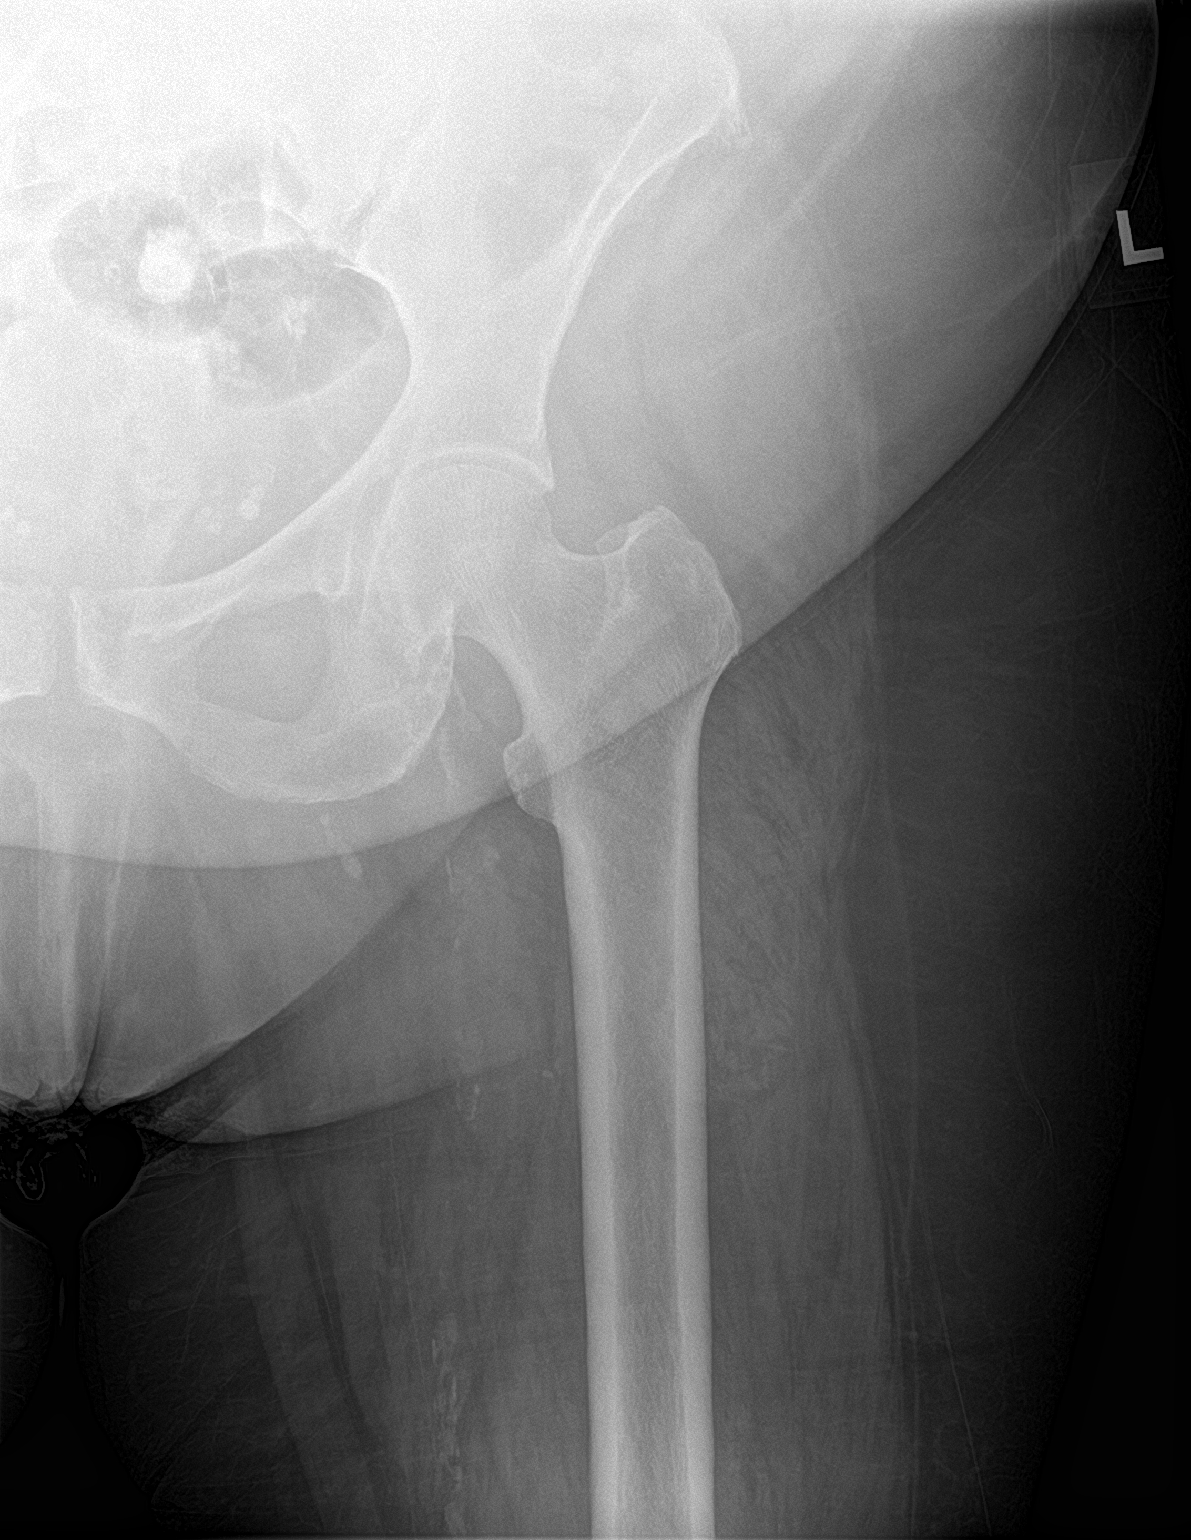
[im 3/3]
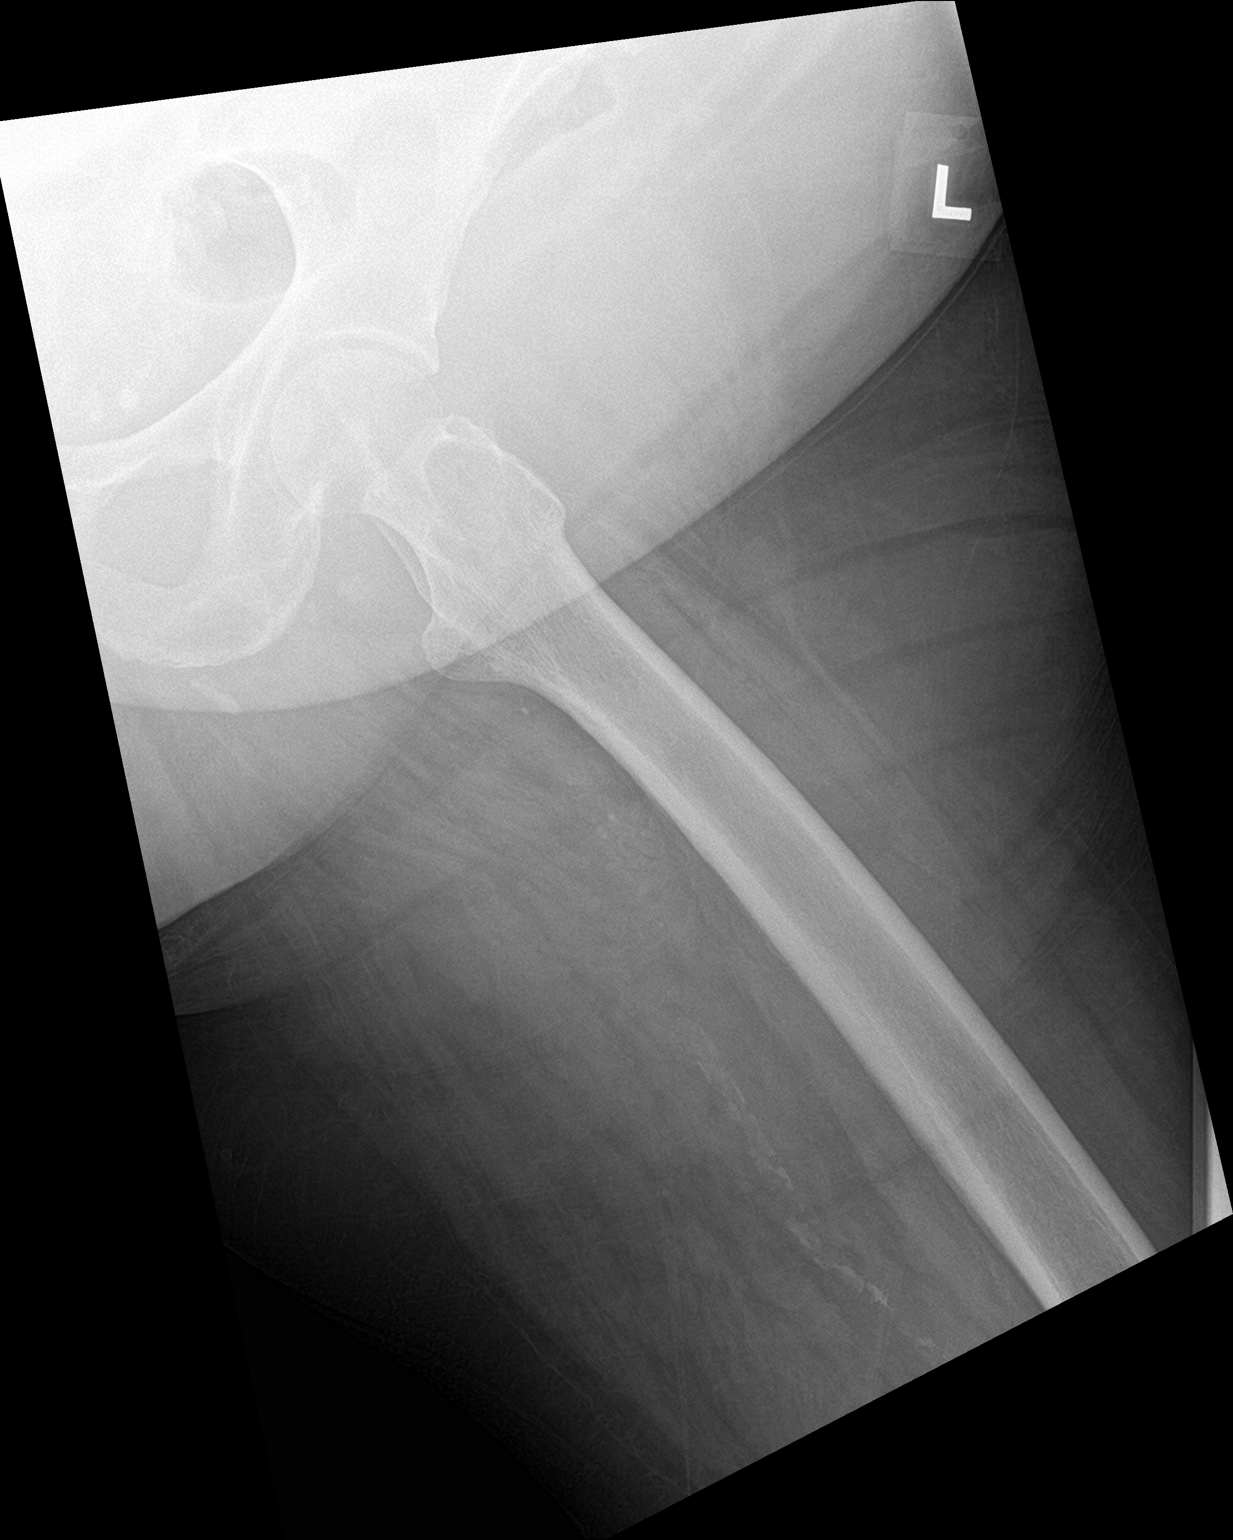

[3 of 3 positions shown; findings below may reference images not displayed]

FINDINGS: There is no acute fracture or subluxation the lumbar spine.
Multilevel degenerative changes with disc space narrowing and
endplate irregularity most prominent involving the lower lumbar at
L3-L4, L4-L5 and L5-S1. Lower lumbar facet arthropathy with probable
associated narrowing of the neural foramina at L5-S1. The visualized
posterior elements are intact.

There is no acute fracture or subluxation of the hip or pelvis. Mild
bilateral hip arthritic changes. The soft tissues are unremarkable.
IMPRESSION: 1. No acute fracture or subluxation of the lumbar spine or
pelvis/hips.
2. Multilevel degenerative changes of the spine and mild bilateral
hip arthritic changes.

## 2020-01-28 IMAGING — DX DG KNEE COMPLETE 4+V*L*
4 series · 4 of 4 positions shown · non-contrast
Comparison: None.

CLINICAL DATA: Left knee pain.

EXAM:
LEFT KNEE - COMPLETE 4+ VIEW

[knee ap]
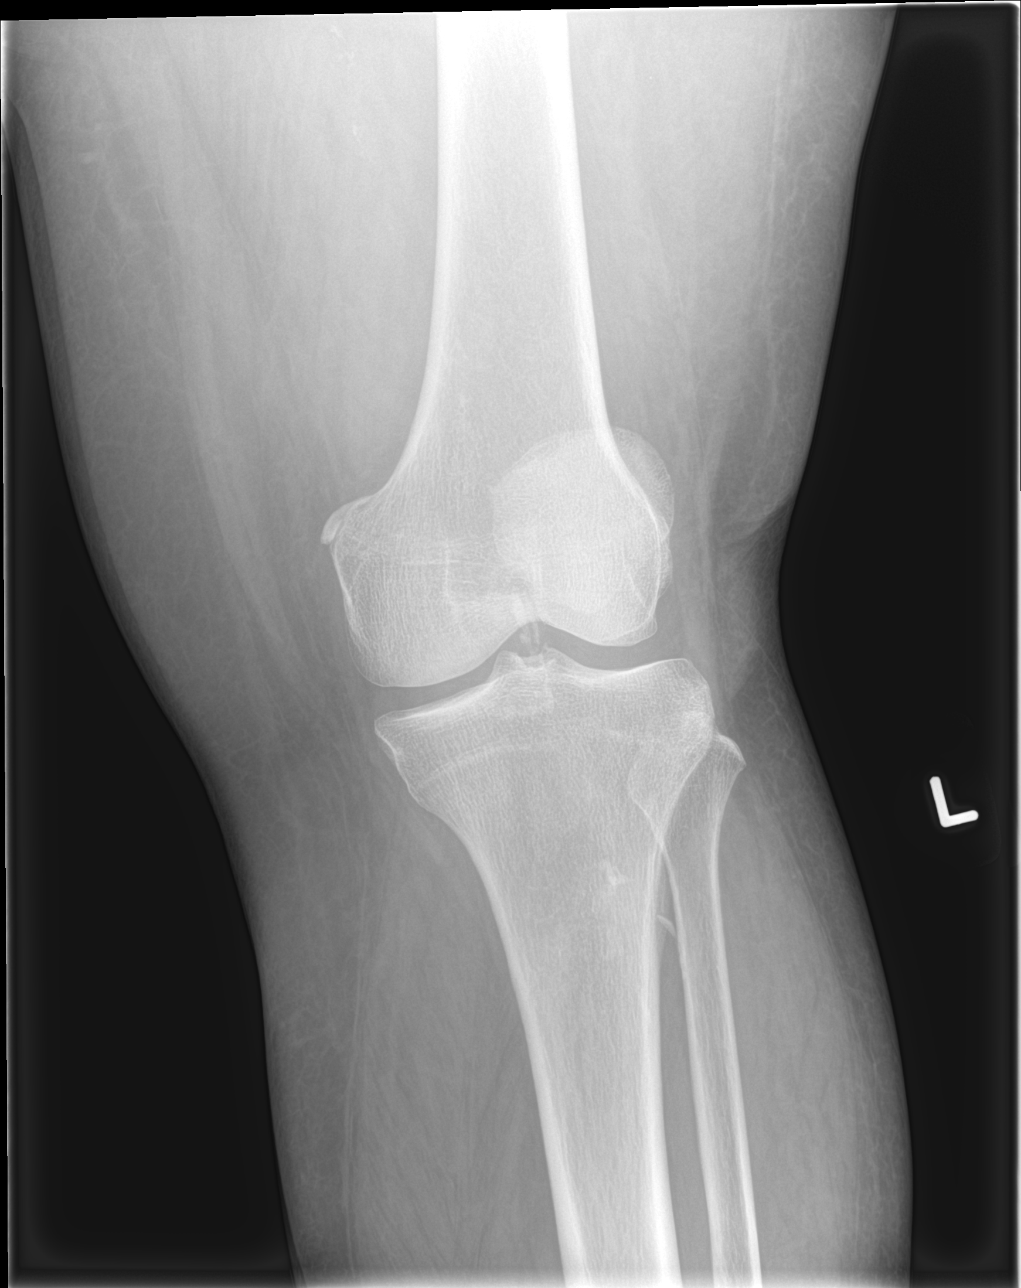

[knee lat]
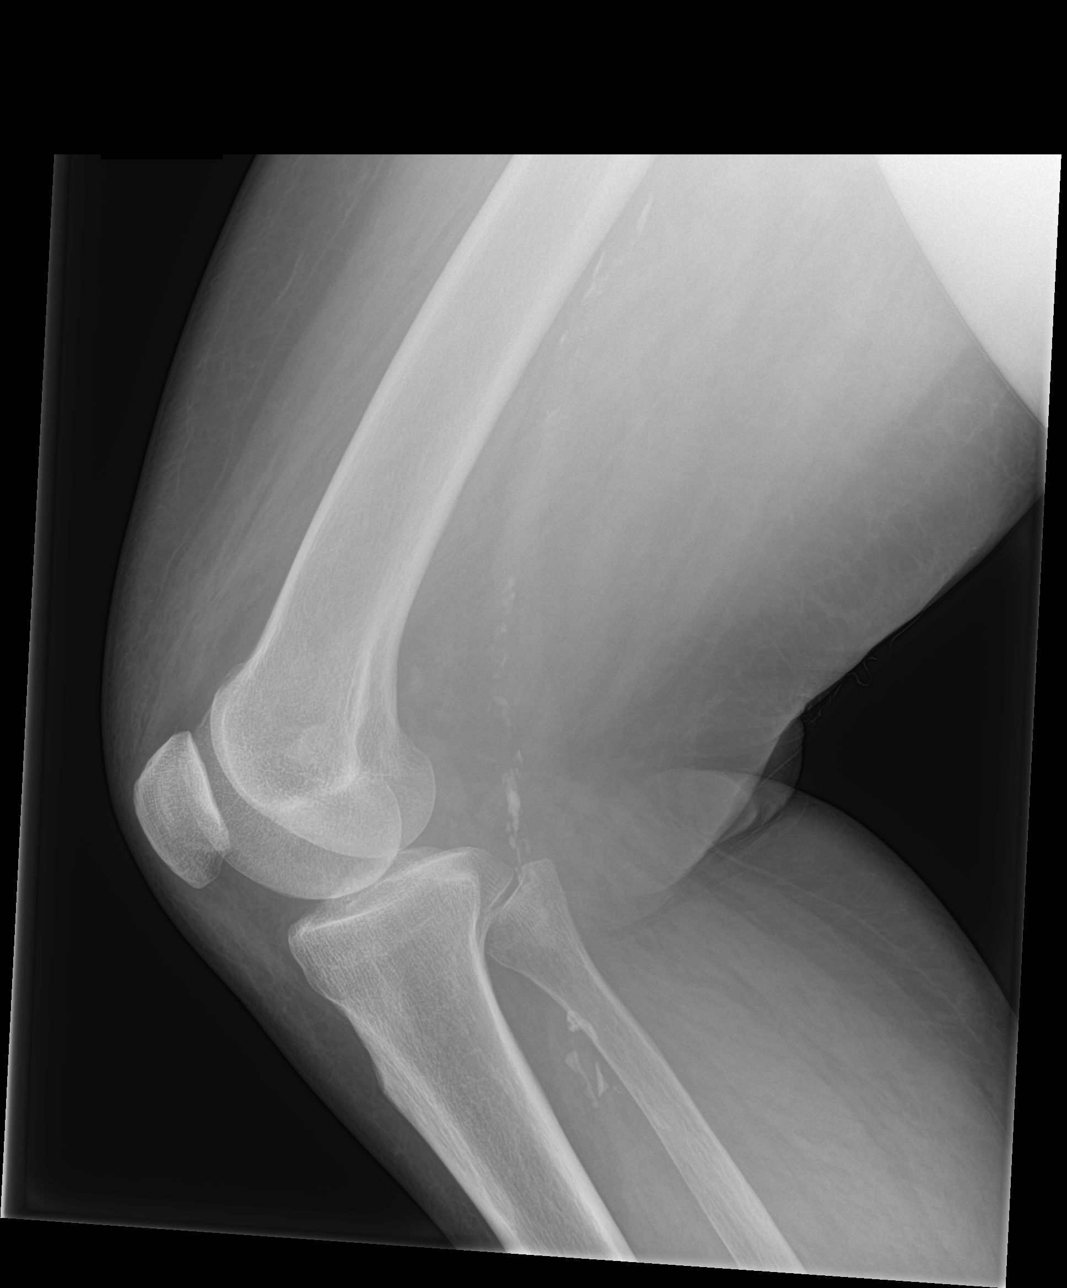

[knee obl (1 of 2)]
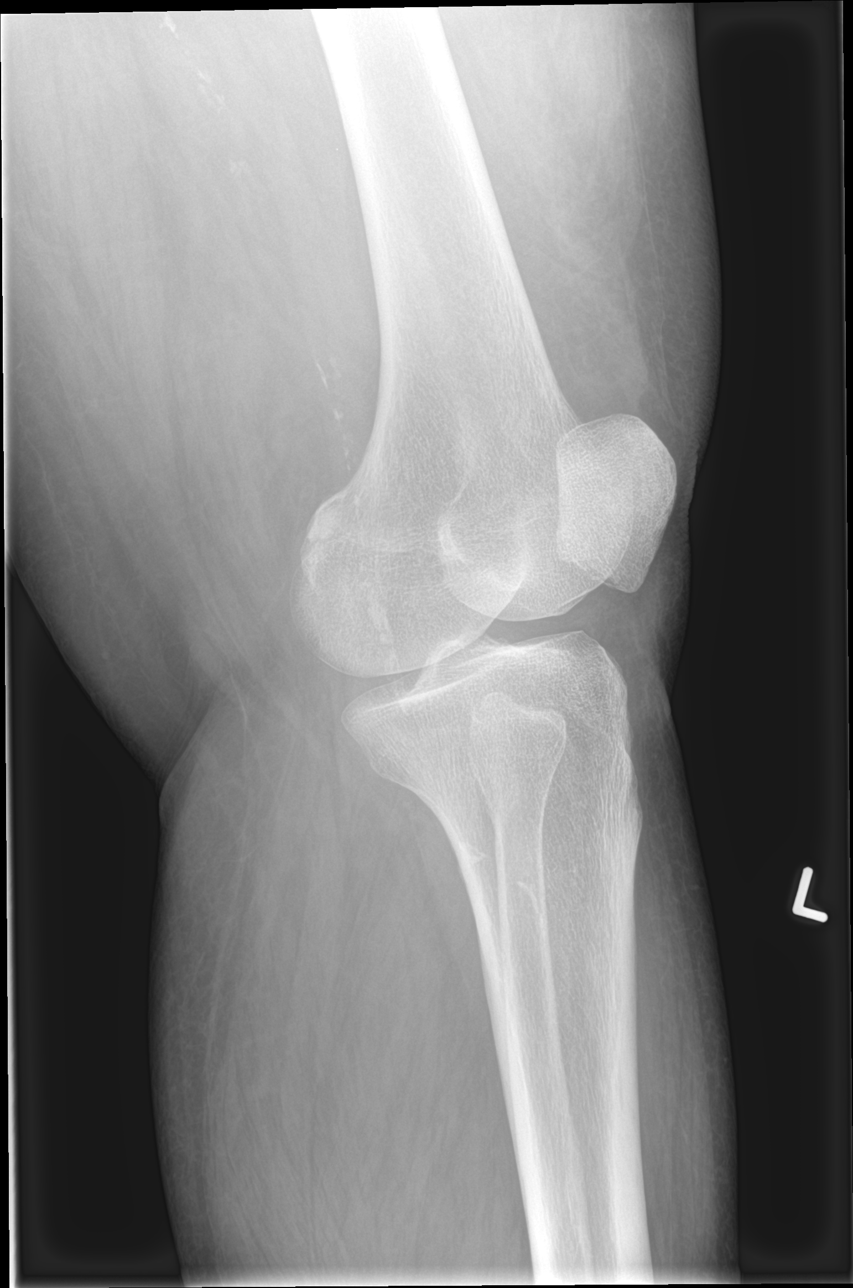

[knee obl (2 of 2)]
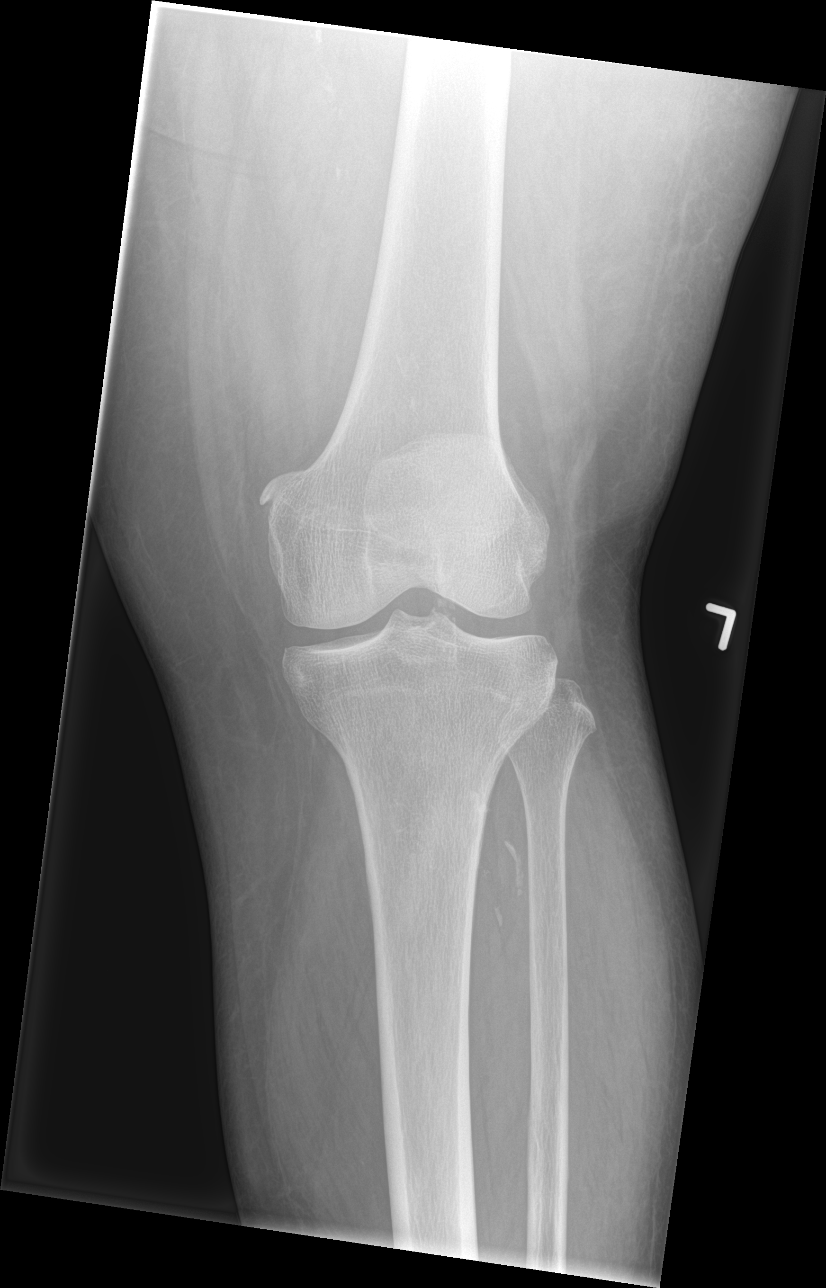

[4 of 4 positions shown; findings below may reference images not displayed]

FINDINGS: No evidence of fracture, dislocation, or joint effusion. Curvilinear
osseous density about the medial femoral metaphysis may represent
prior MCL injury. No evidence of arthropathy or other focal bone
abnormality. There are vascular calcifications. Soft tissues are
unremarkable.
IMPRESSION: 1. No acute abnormality.
2. Peripheral vascular calcifications.

## 2020-02-03 ENCOUNTER — Inpatient Hospital Stay
Admission: EM | Admit: 2020-02-03 | Discharge: 2020-02-11 | DRG: 177 | Disposition: A | Discharge status: Home Health | Payer: Medicare HMO | Attending: Internal Medicine | Admitting: Internal Medicine

## 2020-02-03 ENCOUNTER — Encounter: Payer: Self-pay | Admitting: Emergency Medicine

## 2020-02-03 ENCOUNTER — Other Ambulatory Visit: Payer: Self-pay

## 2020-02-03 ENCOUNTER — Emergency Department: Payer: Medicare HMO

## 2020-02-03 DIAGNOSIS — U071 COVID-19: Principal | ICD-10-CM

## 2020-02-03 DIAGNOSIS — Z90710 Acquired absence of both cervix and uterus: Secondary | ICD-10-CM | POA: Diagnosis not present

## 2020-02-03 DIAGNOSIS — Z7951 Long term (current) use of inhaled steroids: Secondary | ICD-10-CM

## 2020-02-03 DIAGNOSIS — E669 Obesity, unspecified: Secondary | ICD-10-CM | POA: Diagnosis present

## 2020-02-03 DIAGNOSIS — K5900 Constipation, unspecified: Secondary | ICD-10-CM

## 2020-02-03 DIAGNOSIS — J440 Chronic obstructive pulmonary disease with acute lower respiratory infection: Secondary | ICD-10-CM | POA: Diagnosis not present

## 2020-02-03 DIAGNOSIS — R1084 Generalized abdominal pain: Secondary | ICD-10-CM | POA: Diagnosis present

## 2020-02-03 DIAGNOSIS — F17210 Nicotine dependence, cigarettes, uncomplicated: Secondary | ICD-10-CM | POA: Diagnosis present

## 2020-02-03 DIAGNOSIS — K219 Gastro-esophageal reflux disease without esophagitis: Secondary | ICD-10-CM | POA: Diagnosis present

## 2020-02-03 DIAGNOSIS — R0603 Acute respiratory distress: Secondary | ICD-10-CM

## 2020-02-03 DIAGNOSIS — Z9981 Dependence on supplemental oxygen: Secondary | ICD-10-CM | POA: Diagnosis not present

## 2020-02-03 DIAGNOSIS — R918 Other nonspecific abnormal finding of lung field: Secondary | ICD-10-CM | POA: Diagnosis not present

## 2020-02-03 DIAGNOSIS — I82409 Acute embolism and thrombosis of unspecified deep veins of unspecified lower extremity: Secondary | ICD-10-CM

## 2020-02-03 DIAGNOSIS — J1282 Pneumonia due to coronavirus disease 2019: Secondary | ICD-10-CM | POA: Diagnosis not present

## 2020-02-03 DIAGNOSIS — Z886 Allergy status to analgesic agent status: Secondary | ICD-10-CM

## 2020-02-03 DIAGNOSIS — I110 Hypertensive heart disease with heart failure: Secondary | ICD-10-CM | POA: Diagnosis present

## 2020-02-03 DIAGNOSIS — N3941 Urge incontinence: Secondary | ICD-10-CM | POA: Diagnosis present

## 2020-02-03 DIAGNOSIS — I5033 Acute on chronic diastolic (congestive) heart failure: Secondary | ICD-10-CM | POA: Diagnosis not present

## 2020-02-03 DIAGNOSIS — Z6839 Body mass index (BMI) 39.0-39.9, adult: Secondary | ICD-10-CM | POA: Diagnosis not present

## 2020-02-03 DIAGNOSIS — J441 Chronic obstructive pulmonary disease with (acute) exacerbation: Secondary | ICD-10-CM | POA: Diagnosis present

## 2020-02-03 DIAGNOSIS — R0902 Hypoxemia: Secondary | ICD-10-CM

## 2020-02-03 DIAGNOSIS — B029 Zoster without complications: Secondary | ICD-10-CM | POA: Diagnosis present

## 2020-02-03 DIAGNOSIS — J9601 Acute respiratory failure with hypoxia: Secondary | ICD-10-CM

## 2020-02-03 DIAGNOSIS — Z7982 Long term (current) use of aspirin: Secondary | ICD-10-CM

## 2020-02-03 DIAGNOSIS — Z79899 Other long term (current) drug therapy: Secondary | ICD-10-CM

## 2020-02-03 DIAGNOSIS — J9621 Acute and chronic respiratory failure with hypoxia: Secondary | ICD-10-CM | POA: Diagnosis not present

## 2020-02-03 DIAGNOSIS — J310 Chronic rhinitis: Secondary | ICD-10-CM | POA: Diagnosis present

## 2020-02-03 DIAGNOSIS — T380X5A Adverse effect of glucocorticoids and synthetic analogues, initial encounter: Secondary | ICD-10-CM | POA: Diagnosis present

## 2020-02-03 DIAGNOSIS — M25551 Pain in right hip: Secondary | ICD-10-CM | POA: Diagnosis not present

## 2020-02-03 DIAGNOSIS — I5032 Chronic diastolic (congestive) heart failure: Secondary | ICD-10-CM | POA: Diagnosis not present

## 2020-02-03 DIAGNOSIS — F419 Anxiety disorder, unspecified: Secondary | ICD-10-CM | POA: Diagnosis present

## 2020-02-03 DIAGNOSIS — Z87442 Personal history of urinary calculi: Secondary | ICD-10-CM

## 2020-02-03 DIAGNOSIS — R05 Cough: Secondary | ICD-10-CM | POA: Diagnosis not present

## 2020-02-03 DIAGNOSIS — R739 Hyperglycemia, unspecified: Secondary | ICD-10-CM | POA: Diagnosis present

## 2020-02-03 DIAGNOSIS — F329 Major depressive disorder, single episode, unspecified: Secondary | ICD-10-CM | POA: Diagnosis present

## 2020-02-03 DIAGNOSIS — G8929 Other chronic pain: Secondary | ICD-10-CM | POA: Diagnosis present

## 2020-02-03 DIAGNOSIS — Z91018 Allergy to other foods: Secondary | ICD-10-CM

## 2020-02-03 DIAGNOSIS — E876 Hypokalemia: Secondary | ICD-10-CM | POA: Diagnosis present

## 2020-02-03 DIAGNOSIS — J189 Pneumonia, unspecified organism: Secondary | ICD-10-CM

## 2020-02-03 DIAGNOSIS — R0602 Shortness of breath: Secondary | ICD-10-CM

## 2020-02-03 DIAGNOSIS — J1289 Other viral pneumonia: Secondary | ICD-10-CM | POA: Diagnosis not present

## 2020-02-03 DIAGNOSIS — Z8249 Family history of ischemic heart disease and other diseases of the circulatory system: Secondary | ICD-10-CM

## 2020-02-03 DIAGNOSIS — R600 Localized edema: Secondary | ICD-10-CM | POA: Diagnosis not present

## 2020-02-03 HISTORY — DX: COVID-19: U07.1

## 2020-02-03 LAB — BASIC METABOLIC PANEL
Anion gap: 10 (ref 5–15)
BUN: 10 mg/dL (ref 8–23)
CO2: 33 mmol/L — ABNORMAL HIGH (ref 22–32)
Calcium: 8.9 mg/dL (ref 8.9–10.3)
Chloride: 95 mmol/L — ABNORMAL LOW (ref 98–111)
Creatinine, Ser: 0.64 mg/dL (ref 0.44–1.00)
GFR calc Af Amer: >60 mL/min (ref >60)
GFR calc non Af Amer: >60 mL/min (ref >60)
Glucose, Bld: 143 mg/dL — ABNORMAL HIGH (ref 70–99)
Potassium: 2.8 mmol/L — ABNORMAL LOW (ref 3.5–5.1)
Sodium: 138 mmol/L (ref 135–145)

## 2020-02-03 LAB — LACTIC ACID, PLASMA: Lactic Acid, Venous: 1.4 mmol/L (ref 0.5–1.9)

## 2020-02-03 LAB — CBC
HCT: 45.4 % (ref 36.0–46.0)
HCT: 45.4 % (ref 36.0–46.0)
Hemoglobin: 13.8 g/dL (ref 12.0–15.0)
Hemoglobin: 13.6 g/dL (ref 12.0–15.0)
MCH: 27.3 pg (ref 26.0–34.0)
MCH: 27.3 pg (ref 26.0–34.0)
MCHC: 30.4 g/dL (ref 30.0–36.0)
MCHC: 30 g/dL (ref 30.0–36.0)
MCV: 89.9 fL (ref 80.0–100.0)
MCV: 91.2 fL (ref 80.0–100.0)
Platelets: 172 10*3/uL (ref 150–400)
Platelets: 164 10*3/uL (ref 150–400)
RBC: 5.05 MIL/uL (ref 3.87–5.11)
RBC: 4.98 MIL/uL (ref 3.87–5.11)
RDW: 19.6 % — ABNORMAL HIGH (ref 11.5–15.5)
RDW: 19.3 % — ABNORMAL HIGH (ref 11.5–15.5)
WBC: 16.9 10*3/uL — ABNORMAL HIGH (ref 4.0–10.5)
WBC: 18.1 10*3/uL — ABNORMAL HIGH (ref 4.0–10.5)
nRBC: 0.2 % (ref 0.0–0.2)
nRBC: 0.2 % (ref 0.0–0.2)

## 2020-02-03 LAB — ABO/RH: ABO/RH(D): A POS

## 2020-02-03 LAB — FIBRIN DERIVATIVES D-DIMER (ARMC ONLY): Fibrin derivatives D-dimer (ARMC): 3739.85 ng/mL (FEU) — ABNORMAL HIGH (ref 0.00–499.00)

## 2020-02-03 LAB — RESPIRATORY PANEL BY RT PCR (FLU A&B, COVID)
Influenza A by PCR: NEGATIVE
Influenza B by PCR: NEGATIVE
SARS Coronavirus 2 by RT PCR: POSITIVE — AB

## 2020-02-03 LAB — GLUCOSE, CAPILLARY: Glucose-Capillary: 175 mg/dL — ABNORMAL HIGH (ref 70–99)

## 2020-02-03 LAB — FERRITIN: Ferritin: 206 ng/mL (ref 11–307)

## 2020-02-03 LAB — TROPONIN I (HIGH SENSITIVITY)
Troponin I (High Sensitivity): 25 ng/L — ABNORMAL HIGH (ref <18)
Troponin I (High Sensitivity): 19 ng/L — ABNORMAL HIGH (ref <18)

## 2020-02-03 LAB — LACTATE DEHYDROGENASE: LDH: 361 U/L — ABNORMAL HIGH (ref 98–192)

## 2020-02-03 LAB — MAGNESIUM: Magnesium: 2 mg/dL (ref 1.7–2.4)

## 2020-02-03 LAB — PROCALCITONIN: Procalcitonin: <0.1 ng/mL

## 2020-02-03 LAB — CREATININE, SERUM
Creatinine, Ser: 0.54 mg/dL (ref 0.44–1.00)
GFR calc Af Amer: >60 mL/min (ref >60)
GFR calc non Af Amer: >60 mL/min (ref >60)

## 2020-02-03 LAB — FIBRINOGEN: Fibrinogen: 707 mg/dL — ABNORMAL HIGH (ref 210–475)

## 2020-02-03 LAB — POTASSIUM: Potassium: 2.8 mmol/L — ABNORMAL LOW (ref 3.5–5.1)

## 2020-02-03 LAB — BRAIN NATRIURETIC PEPTIDE: B Natriuretic Peptide: 190 pg/mL — ABNORMAL HIGH (ref 0.0–100.0)

## 2020-02-03 LAB — PHOSPHORUS: Phosphorus: 3.1 mg/dL (ref 2.5–4.6)

## 2020-02-03 MED ORDER — FUROSEMIDE 10 MG/ML IJ SOLN
40.0000 mg | Freq: Once | INTRAMUSCULAR | Status: AC
  Administered 2020-02-04: 40 mg via INTRAVENOUS
  Filled 2020-02-03: qty 4

## 2020-02-03 MED ORDER — PANTOPRAZOLE SODIUM 40 MG IV SOLR
40.0000 mg | Freq: Every day | INTRAVENOUS | Status: DC
  Administered 2020-02-03 – 2020-02-09 (×7): 40 mg via INTRAVENOUS
  Filled 2020-02-03 (×7): qty 40

## 2020-02-03 MED ORDER — SODIUM CHLORIDE 0.9 % IV SOLN
100.0000 mg | Freq: Every day | INTRAVENOUS | Status: AC
  Administered 2020-02-04 – 2020-02-07 (×4): 100 mg via INTRAVENOUS
  Filled 2020-02-03: qty 100
  Filled 2020-02-03: qty 20
  Filled 2020-02-03 (×2): qty 100

## 2020-02-03 MED ORDER — FLUTICASONE-UMECLIDIN-VILANT 100-62.5-25 MCG/INH IN AEPB: 1.0000 | INHALATION_SPRAY | Freq: Every day | RESPIRATORY_TRACT | Status: DC

## 2020-02-03 MED ORDER — ALBUTEROL SULFATE HFA 108 (90 BASE) MCG/ACT IN AERS
2.0000 | INHALATION_SPRAY | Freq: Four times a day (QID) | RESPIRATORY_TRACT | Status: DC | PRN
  Filled 2020-02-03: qty 6.7

## 2020-02-03 MED ORDER — VANCOMYCIN HCL IN DEXTROSE 1-5 GM/200ML-% IV SOLN
1000.0000 mg | Freq: Once | INTRAVENOUS | Status: AC
  Administered 2020-02-03: 1000 mg via INTRAVENOUS
  Filled 2020-02-03: qty 200

## 2020-02-03 MED ORDER — POTASSIUM CHLORIDE CRYS ER 20 MEQ PO TBCR: 40.0000 meq | EXTENDED_RELEASE_TABLET | Freq: Once | ORAL | Status: DC

## 2020-02-03 MED ORDER — ACETAMINOPHEN 325 MG PO TABS
650.0000 mg | ORAL_TABLET | Freq: Four times a day (QID) | ORAL | Status: DC | PRN
  Administered 2020-02-06 – 2020-02-10 (×2): 650 mg via ORAL
  Filled 2020-02-03 (×2): qty 2

## 2020-02-03 MED ORDER — ALBUTEROL SULFATE HFA 108 (90 BASE) MCG/ACT IN AERS
2.0000 | INHALATION_SPRAY | Freq: Four times a day (QID) | RESPIRATORY_TRACT | Status: DC
  Administered 2020-02-03 – 2020-02-11 (×30): 2 via RESPIRATORY_TRACT
  Filled 2020-02-03: qty 6.7

## 2020-02-03 MED ORDER — METHYLPREDNISOLONE SODIUM SUCC 125 MG IJ SOLR
125.0000 mg | Freq: Once | INTRAMUSCULAR | Status: AC
  Administered 2020-02-03: 125 mg via INTRAVENOUS
  Filled 2020-02-03: qty 2

## 2020-02-03 MED ORDER — IPRATROPIUM-ALBUTEROL 0.5-2.5 (3) MG/3ML IN SOLN
3.0000 mL | Freq: Once | RESPIRATORY_TRACT | Status: AC
  Administered 2020-02-03: 15:00:00 3 mL via RESPIRATORY_TRACT

## 2020-02-03 MED ORDER — DEXAMETHASONE SODIUM PHOSPHATE 10 MG/ML IJ SOLN
6.0000 mg | Freq: Every day | INTRAMUSCULAR | Status: DC
  Administered 2020-02-03 – 2020-02-11 (×9): 6 mg via INTRAVENOUS
  Filled 2020-02-03: qty 1
  Filled 2020-02-03 (×3): qty 0.6
  Filled 2020-02-03: qty 1
  Filled 2020-02-03: qty 0.6
  Filled 2020-02-03 (×4): qty 1

## 2020-02-03 MED ORDER — INSULIN ASPART 100 UNIT/ML ~~LOC~~ SOLN: 0.0000 [IU] | Freq: Every day | SUBCUTANEOUS | Status: DC

## 2020-02-03 MED ORDER — IPRATROPIUM-ALBUTEROL 0.5-2.5 (3) MG/3ML IN SOLN
3.0000 mL | Freq: Once | RESPIRATORY_TRACT | Status: AC
  Administered 2020-02-03: 3 mL via RESPIRATORY_TRACT

## 2020-02-03 MED ORDER — ZINC SULFATE 220 (50 ZN) MG PO CAPS
220.0000 mg | ORAL_CAPSULE | Freq: Every day | ORAL | Status: DC
  Administered 2020-02-04 – 2020-02-11 (×8): 220 mg via ORAL
  Filled 2020-02-03 (×8): qty 1

## 2020-02-03 MED ORDER — INSULIN ASPART 100 UNIT/ML ~~LOC~~ SOLN
0.0000 [IU] | Freq: Three times a day (TID) | SUBCUTANEOUS | Status: DC
  Administered 2020-02-04: 2 [IU] via SUBCUTANEOUS
  Administered 2020-02-07 – 2020-02-08 (×2): 3 [IU] via SUBCUTANEOUS
  Administered 2020-02-09: 2 [IU] via SUBCUTANEOUS
  Administered 2020-02-09: 3 [IU] via SUBCUTANEOUS
  Filled 2020-02-03 (×5): qty 1

## 2020-02-03 MED ORDER — SODIUM CHLORIDE 0.9 % IV SOLN
200.0000 mg | Freq: Once | INTRAVENOUS | Status: AC
  Administered 2020-02-03: 20:00:00 200 mg via INTRAVENOUS
  Filled 2020-02-03: qty 40

## 2020-02-03 MED ORDER — POTASSIUM CHLORIDE 10 MEQ/100ML IV SOLN
10.0000 meq | INTRAVENOUS | Status: AC
  Administered 2020-02-03 – 2020-02-04 (×4): 10 meq via INTRAVENOUS
  Filled 2020-02-03 (×4): qty 100

## 2020-02-03 MED ORDER — INSULIN ASPART 100 UNIT/ML ~~LOC~~ SOLN
3.0000 [IU] | Freq: Three times a day (TID) | SUBCUTANEOUS | Status: DC
  Administered 2020-02-07 – 2020-02-09 (×4): 3 [IU] via SUBCUTANEOUS
  Filled 2020-02-03 (×4): qty 1

## 2020-02-03 MED ORDER — ASCORBIC ACID 500 MG PO TABS
250.0000 mg | ORAL_TABLET | Freq: Every day | ORAL | Status: DC
  Administered 2020-02-04 – 2020-02-11 (×8): 250 mg via ORAL
  Filled 2020-02-03 (×8): qty 1

## 2020-02-03 MED ORDER — ENOXAPARIN SODIUM 40 MG/0.4ML ~~LOC~~ SOLN
40.0000 mg | SUBCUTANEOUS | Status: DC
  Administered 2020-02-03 – 2020-02-10 (×8): 40 mg via SUBCUTANEOUS
  Filled 2020-02-03 (×9): qty 0.4

## 2020-02-03 MED ORDER — VITAMIN D 25 MCG (1000 UNIT) PO TABS
2000.0000 [IU] | ORAL_TABLET | Freq: Every day | ORAL | Status: DC
  Administered 2020-02-04 – 2020-02-11 (×8): 2000 [IU] via ORAL
  Filled 2020-02-03 (×8): qty 2

## 2020-02-03 MED ORDER — IPRATROPIUM-ALBUTEROL 0.5-2.5 (3) MG/3ML IN SOLN
RESPIRATORY_TRACT | Status: AC
  Filled 2020-02-03: qty 9

## 2020-02-03 MED ORDER — FUROSEMIDE 10 MG/ML IJ SOLN
40.0000 mg | Freq: Once | INTRAMUSCULAR | Status: AC
  Administered 2020-02-03: 18:00:00 40 mg via INTRAVENOUS
  Filled 2020-02-03: qty 4

## 2020-02-03 MED ORDER — MORPHINE SULFATE (PF) 2 MG/ML IV SOLN
1.0000 mg | INTRAVENOUS | Status: DC | PRN
  Administered 2020-02-04: 18:00:00 1 mg via INTRAVENOUS
  Administered 2020-02-04 – 2020-02-05 (×2): 2 mg via INTRAVENOUS
  Filled 2020-02-03 (×3): qty 1

## 2020-02-03 MED ORDER — GUAIFENESIN-DM 100-10 MG/5ML PO SYRP
10.0000 mL | ORAL_SOLUTION | ORAL | Status: DC | PRN
  Filled 2020-02-03: qty 10

## 2020-02-03 MED ORDER — GABAPENTIN 300 MG PO CAPS
400.0000 mg | ORAL_CAPSULE | Freq: Three times a day (TID) | ORAL | Status: DC
  Administered 2020-02-03 – 2020-02-11 (×20): 400 mg via ORAL
  Filled 2020-02-03 (×24): qty 1

## 2020-02-03 MED ORDER — BACLOFEN 10 MG PO TABS
10.0000 mg | ORAL_TABLET | Freq: Three times a day (TID) | ORAL | Status: DC
  Administered 2020-02-03 – 2020-02-11 (×20): 10 mg via ORAL
  Filled 2020-02-03 (×27): qty 1

## 2020-02-03 MED ORDER — PIPERACILLIN-TAZOBACTAM 3.375 G IVPB 30 MIN
3.3750 g | Freq: Once | INTRAVENOUS | Status: AC
  Administered 2020-02-03: 3.375 g via INTRAVENOUS
  Filled 2020-02-03: qty 50

## 2020-02-03 NOTE — H&P (Addendum)
Theresa [name] at [place] NAME: Theresa [name]    MR#:  [id number]  DATE OF BIRTH:  10/06/1956  DATE OF ADMISSION:  02/03/2020  PRIMARY CARE PHYSICIAN: Margo Common, PA   REQUESTING/REFERRING PHYSICIAN: Earleen Newport, MD  CHIEF COMPLAINT:   Chief Complaint  Patient presents with  . Respiratory Distress    HISTORY OF PRESENT ILLNESS:  Theresa [name]  is a 64 y.o. female with a known history of anxiety, CHF, COPD, depression, GERD being admitted for COVID pneumonia. She came in for shortness of breath worsening over the last week with productive cough.  Patient denies any fevers, states she usually wears 2-1/2 L of oxygen at baseline.  She presents with hypoxia and tachypnea. O2 sats in 80s requiring BiPAP. covid is +. PAST MEDICAL HISTORY:   Past Medical History:  Diagnosis Date  . Acute postoperative pain 10/03/2017  . Anxiety   . BiPAP (biphasic positive airway pressure) dependence    at hs  . Carpal tunnel syndrome   . CHF (congestive heart failure) ([place])    1/18  . CHF (congestive heart failure) ([place])   . Chronic generalized abdominal pain   . Chronic rhinitis   . COPD (chronic obstructive pulmonary disease) ([place])   . DDD (degenerative disc disease), cervical   . DDD (degenerative disc disease), lumbosacral   . Depression   . Dyspnea   . Edema   . Flu    1/18  . Gastritis   . GERD (gastroesophageal reflux disease)   . Hematuria   . Hernia of abdominal wall 07/17/2018  . Hernia, abdominal   . Hypertension   . Kidney stones   . Low back pain   . Lumbar radiculitis   . Malodorous urine   . Muscle weakness   . Obesity   . Oxygen dependent   . Renal cyst   . Sensory urge incontinence   . Thyroid activity decreased    9/19  . Tobacco abuse   . Wheezing    PAST SURGICAL HISTORY:   Past Surgical History:  Procedure Laterality Date  . ABDOMINAL HYSTERECTOMY    . CARPAL TUNNEL RELEASE Left 2012  . EPIGASTRIC HERNIA REPAIR  N/A 08/13/2018   HERNIA REPAIR EPIGASTRIC ADULT; polypropylene mesh reinforcement.  Surgeon: Robert Bellow, MD;  Location: [facility];  Service: General;  Laterality: N/A;  . RIGHT/LEFT HEART CATH AND CORONARY ANGIOGRAPHY N/A 07/11/2019   Procedure: RIGHT/LEFT HEART CATH AND CORONARY ANGIOGRAPHY;  Surgeon: Yolonda Kida, MD;  Location: [facility];  Service: Cardiovascular;  Laterality: N/A;  . TONSILLECTOMY    . TUBAL LIGATION    . UMBILICAL HERNIA REPAIR N/A 08/13/2018   HERNIA REPAIR UMBILICAL ADULT; polypropylene mesh reinforcement Surgeon: Robert Bellow, MD;  Location: [facility];  Service: General;  Laterality: N/A;   SOCIAL HISTORY:   Social History   Tobacco Use  . Smoking status: Current Every Day Smoker    Packs/day: 0.50    Years: 44.00    Pack years: 22.00    Types: Cigarettes  . Smokeless tobacco: Never Used  . Tobacco comment: over a half pack  Substance Use Topics  . Alcohol use: No   FAMILY HISTORY:   Family History  Problem Relation Age of Onset  . Heart disease Mother   . Stroke Mother   . Coronary artery disease Mother   . Lung cancer Sister   . Cancer Sister   . Breast cancer Sister   .  Alcohol abuse Father   . Heart disease Father   . Cancer Brother   . Cancer Brother   . Pneumonia Brother   . Prostate cancer Neg Hx   . Kidney cancer Neg Hx   . Bladder Cancer Neg Hx    DRUG ALLERGIES:   Allergies  Allergen Reactions  . Codeine Nausea And Vomiting  . Meloxicam Other (See Comments)    Stomach pain   REVIEW OF SYSTEMS:  ROS unable to obtain due to being on BiPAP MEDICATIONS AT HOME:   Prior to Admission medications   Medication Sig Start Date End Date Taking? Authorizing Provider  albuterol (VENTOLIN HFA) 108 (90 Base) MCG/ACT inhaler Inhale 2 puffs into the lungs every 6 (six) hours as needed for wheezing or shortness of breath. 10/23/19   Rudene Re, MD  aspirin EC 81 MG tablet Take 81 mg by mouth daily.     [provider]  baclofen (LIORESAL) 10 MG tablet Take 1 tablet (10 mg total) by mouth 3 (three) times daily. 01/13/20 07/11/20  Milinda Pointer, MD  bisoprolol-hydrochlorothiazide [facility]) 10-6.25 MG tablet TAKE 1 TABLET BY MOUTH DAILY Patient not taking: Reported on 01/22/2020 06/27/19   Chrismon, Vickki Muff, PA  Cholecalciferol ([facility] D3) 2000 units CAPS Take 2,000 Units by mouth daily.     [provider]  digoxin (LANOXIN) 0.25 MG tablet TAKE 1 TABLET(0.25 MG) BY MOUTH DAILY 11/17/19   Chrismon, Vickki Muff, PA  Fluticasone-Umeclidin-Vilant 100-62.5-25 MCG/INH AEPB Inhale 1 puff into the lungs daily. 10/03/18   [provider]  gabapentin (NEURONTIN) 400 MG capsule Take 1 capsule (400 mg total) by mouth 3 (three) times daily. 01/13/20 07/11/20  Milinda Pointer, MD  HYDROcodone-acetaminophen (NORCO/VICODIN) 5-325 MG tablet Take 1 tablet by mouth every 8 (eight) hours as needed for severe pain. Must last 30 days 01/13/20 02/12/20  Milinda Pointer, MD  HYDROcodone-acetaminophen (NORCO/VICODIN) 5-325 MG tablet Take 1 tablet by mouth every 8 (eight) hours as needed for severe pain. Must last 30 days Patient not taking: Reported on 01/22/2020 02/12/20 03/13/20  Milinda Pointer, MD  HYDROcodone-acetaminophen (NORCO/VICODIN) 5-325 MG tablet Take 1 tablet by mouth every 8 (eight) hours as needed for severe pain. Must last 30 days Patient not taking: Reported on 01/22/2020 03/13/20 04/12/20  Milinda Pointer, MD  ipratropium-albuterol (DUONEB) 0.5-2.5 (3) MG/3ML SOLN USE 1 VIAL VIA NEBULIZER EVERY 6 HOURS AS NEEDED FOR SHORTNESS OF BREATH OR WHEEZING 10/14/19   Chrismon, Vickki Muff, PA  levothyroxine (SYNTHROID) 25 MCG tablet TAKE 1 TABLET(25 MCG) BY MOUTH DAILY BEFORE BREAKFAST Patient not taking: Reported on 01/22/2020 12/27/19   Chrismon, Vickki Muff, PA  potassium chloride SA (KLOR-CON) 20 MEQ tablet TAKE 1 TABLET(20 MEQ) BY MOUTH TWICE DAILY 09/15/19   Darylene Price A, FNP  sertraline (ZOLOFT)  100 MG tablet TAKE 2 TABLETS BY MOUTH EVERY MORNING 11/24/19   Chrismon, Vickki Muff, PA  torsemide (DEMADEX) 20 MG tablet TAKE 2 TABLETS(40 MG) BY MOUTH TWICE DAILY Patient taking differently: Take 40 mg by mouth 2 (two) times daily.  11/24/19   [name], FNP    VITAL SIGNS:  Blood pressure 116/69, pulse 96, temperature 98.7 F (37.1 C), temperature source Oral, resp. rate (!) 29, height _0  (1.575 m), weight 91.6 kg, SpO2 94 %. PHYSICAL EXAMINATION:  Physical Exam  GENERAL:  64 y.o.-year-old patient lying in the bed in acute resp distress. On BiPAP EYES: Pupils equal, round, reactive to light and accommodation. No scleral icterus. Extraocular muscles intact.  HEENT: Head atraumatic, normocephalic. Oropharynx and nasopharynx clear.  NECK:  Supple, no jugular venous distention. No thyroid enlargement, no tenderness.  LUNGS: Decreased breath sounds bilaterally, no wheezing, rales,rhonchi or crepitation. Using accessory muscles of respiration.  CARDIOVASCULAR: S1, S2 normal. No murmurs, rubs, or gallops.  ABDOMEN: Soft, nontender, nondistended. Bowel sounds present. No organomegaly or mass.  EXTREMITIES: No pedal edema, cyanosis, or clubbing.  NEUROLOGIC: Cranial nerves II through XII are intact. Muscle strength 5/5 in all extremities. Sensation intact. Gait not checked.  PSYCHIATRIC: The patient is alert and oriented x 3.  SKIN: No obvious rash, lesion, or ulcer.  LABORATORY PANEL:   CBC Recent Labs  Lab 02/03/20 1423  WBC 16.9*  HGB 13.8  HCT 45.4  PLT 172   ------------------------------------------------------------------------------------------------------------------  Chemistries  Recent Labs  Lab 02/03/20 1423  NA 138  K 2.8*  CL 95*  CO2 33*  GLUCOSE 143*  BUN 10  CREATININE 0.64  CALCIUM 8.9   ------------------------------------------------------------------------------------------------------------------  Cardiac Enzymes No results for input(s): TROPONINI  in the last 168 hours. ------------------------------------------------------------------------------------------------------------------  RADIOLOGY:  DG Chest 1 View  Result Date: 02/03/2020 CLINICAL DATA:  Dyspnea, shortness of breath, productive cough EXAM: CHEST  1 VIEW COMPARISON:  10/22/2019 FINDINGS: Cardiomegaly. Diffuse bilateral heterogeneous and interstitial airspace opacity. The visualized skeletal structures are unremarkable. IMPRESSION: Cardiomegaly with diffuse bilateral heterogeneous and interstitial airspace opacity, which may reflect multifocal infection or edema. Electronically Signed   By: Eddie Candle M.D.   On: 02/03/2020 15:38   IMPRESSION AND PLAN:  44 y female with known history of anxiety, CHF, COPD, depression, GERD being admitted for COVID pneumonia.  Acute hypoxic resp failure Present on admission, requiring BiPAP Due to COVID pneumonia  COVID Pneumonia - daily inflammatory markers - consider actemera if appropriate based on inflammatory markers and PCCM eval - remdesevir and steroids per pharmacy - Vit c & Zinc daily - PCCM c/s  Hypokalemia Replete & recheck, check Mg  Chronic diastolic CHF Well compensated at this time. Last echo showed normal EF   All the records are reviewed and case discussed with ED provider. Management plans discussed with the patient, nursing and they are in agreement.  CODE STATUS: FULL CODE  TOTAL TIME (critical care) TAKING CARE OF THIS PATIENT: 45 minutes.    Max Sane M.D on 02/03/2020 at 5:34 PM  [facility]   CC: Primary care physician; Margo Common, PA   Note: This dictation was prepared with Dragon dictation along with smaller phrase technology. Any transcriptional errors that result from this process are unintentional.

## 2020-02-03 NOTE — Consult Note (Signed)
Name: Theresa [name] MRN: [id number] DOB: 03-24-1956    ADMISSION DATE:  02/03/2020 CONSULTATION DATE: 02/03/2020  REFERRING MD : Dr. Manuella Ghazi   CHIEF COMPLAINT: Shortness of Breath   BRIEF PATIENT DESCRIPTION: 64 yo female admitted with acute on chronic hypoxic respiratory failure COVID-19 pneumonia and AECOPD requiring Bipap   SIGNIFICANT EVENTS/STUDIES:  04/19: Pt admitted with acute on chronic hypoxic respiratory failure requiring Bipap   HISTORY OF PRESENT ILLNESS:  This is a 64 yo female with a PMH of Tobacco Abuse, Obesity, GERD, DDD, COPD (Bipap qhs), CHF, and Anxiety. She presented to [facility] ER on 04/19 with c/o shortness of breath, cough, nausea, vomiting, and diarrhea onset of symptoms a few days prior to presentation although n/v/d has since resolved.  In the ER CXR concerning for multifocal pneumonia and/or possible pulmonary edema.  Lab results revealed K+ 2.8, glucose 143, BNP 190, LDH 361, troponin 25, d-dimer 3,739.85, fibrinogen 707, and wbc 16.8.  Influenza PCR negative, however COVID-19 positive.  Pt received 40 mg iv lasix, 125 mg iv solumedrol x1 dose, duonebs x3, remdesivir, vancomycin, and zosyn.  Due to continued tachypnea and hypoxia pt placed on Bipap.  She was subsequently admitted to the stepdown unit per hospitalist team PCCM consulted to assist with management.   PAST MEDICAL HISTORY :   has a past medical history of Acute postoperative pain (10/03/2017), Anxiety, BiPAP (biphasic positive airway pressure) dependence, Carpal tunnel syndrome, CHF (congestive heart failure) ([place]), CHF (congestive heart failure) ([place]), Chronic generalized abdominal pain, Chronic rhinitis, COPD (chronic obstructive pulmonary disease) ([place]), DDD (degenerative disc disease), cervical, DDD (degenerative disc disease), lumbosacral, Depression, Dyspnea, Edema, Flu, Gastritis, GERD (gastroesophageal reflux disease), Hematuria, Hernia of abdominal wall (07/17/2018), Hernia, abdominal, Hypertension,  Kidney stones, Low back pain, Lumbar radiculitis, Malodorous urine, Muscle weakness, Obesity, Oxygen dependent, Renal cyst, Sensory urge incontinence, Thyroid activity decreased, Tobacco abuse, and Wheezing.  has a past surgical history that includes Abdominal hysterectomy; Tonsillectomy; Carpal tunnel release (Left, 2012); Tubal ligation; epigastric hernia repair (N/A, 08/13/2018); Umbilical hernia repair (N/A, 08/13/2018); and RIGHT/LEFT HEART CATH AND CORONARY ANGIOGRAPHY (N/A, 07/11/2019). Prior to Admission medications   Medication Sig Start Date End Date Taking? Authorizing Provider  albuterol (VENTOLIN HFA) 108 (90 Base) MCG/ACT inhaler Inhale 2 puffs into the lungs every 6 (six) hours as needed for wheezing or shortness of breath. 10/23/19   Rudene Re, MD  aspirin EC 81 MG tablet Take 81 mg by mouth daily.    [provider]  baclofen (LIORESAL) 10 MG tablet Take 1 tablet (10 mg total) by mouth 3 (three) times daily. 01/13/20 07/11/20  Milinda Pointer, MD  bisoprolol-hydrochlorothiazide [facility]) 10-6.25 MG tablet TAKE 1 TABLET BY MOUTH DAILY Patient not taking: Reported on 01/22/2020 06/27/19   Chrismon, Vickki Muff, PA  Cholecalciferol ([facility]S D3) 2000 units CAPS Take 2,000 Units by mouth daily.     [provider]  digoxin (LANOXIN) 0.25 MG tablet TAKE 1 TABLET(0.25 MG) BY MOUTH DAILY 11/17/19   Chrismon, Vickki Muff, PA  Fluticasone-Umeclidin-Vilant 100-62.5-25 MCG/INH AEPB Inhale 1 puff into the lungs daily. 10/03/18   [provider]  gabapentin (NEURONTIN) 400 MG capsule Take 1 capsule (400 mg total) by mouth 3 (three) times daily. 01/13/20 07/11/20  Milinda Pointer, MD  HYDROcodone-acetaminophen (NORCO/VICODIN) 5-325 MG tablet Take 1 tablet by mouth every 8 (eight) hours as needed for severe pain. Must last 30 days 01/13/20 02/12/20  Milinda Pointer, MD  HYDROcodone-acetaminophen (NORCO/VICODIN) 5-325 MG tablet Take 1 tablet by mouth every 8 (  eight) hours as needed  for severe pain. Must last 30 days Patient not taking: Reported on 01/22/2020 02/12/20 03/13/20  Milinda Pointer, MD  HYDROcodone-acetaminophen (NORCO/VICODIN) 5-325 MG tablet Take 1 tablet by mouth every 8 (eight) hours as needed for severe pain. Must last 30 days Patient not taking: Reported on 01/22/2020 03/13/20 04/12/20  Milinda Pointer, MD  ipratropium-albuterol (DUONEB) 0.5-2.5 (3) MG/3ML SOLN USE 1 VIAL VIA NEBULIZER EVERY 6 HOURS AS NEEDED FOR SHORTNESS OF BREATH OR WHEEZING 10/14/19   Chrismon, Vickki Muff, PA  levothyroxine (SYNTHROID) 25 MCG tablet TAKE 1 TABLET(25 MCG) BY MOUTH DAILY BEFORE BREAKFAST Patient not taking: Reported on 01/22/2020 12/27/19   Chrismon, Vickki Muff, PA  potassium chloride SA (KLOR-CON) 20 MEQ tablet TAKE 1 TABLET(20 MEQ) BY MOUTH TWICE DAILY 09/15/19   Darylene Price A, FNP  sertraline (ZOLOFT) 100 MG tablet TAKE 2 TABLETS BY MOUTH EVERY MORNING 11/24/19   Chrismon, Vickki Muff, PA  torsemide (DEMADEX) 20 MG tablet TAKE 2 TABLETS(40 MG) BY MOUTH TWICE DAILY Patient taking differently: Take 40 mg by mouth 2 (two) times daily.  11/24/19   [name], FNP   Allergies  Allergen Reactions  . Codeine Nausea And Vomiting  . Meloxicam Other (See Comments)    Stomach pain    FAMILY HISTORY:  family history includes Alcohol abuse in her father; Breast cancer in her sister; Cancer in her brother, brother, and sister; Coronary artery disease in her mother; Heart disease in her father and mother; Lung cancer in her sister; Pneumonia in her brother; Stroke in her mother. SOCIAL HISTORY:  reports that she has been smoking cigarettes. She has a 22.00 pack-year smoking history. She has never used smokeless tobacco. She reports that she does not drink alcohol or use drugs.  REVIEW OF SYSTEMS: Positives in BOLD  Constitutional: Negative for fever, chills, weight loss, malaise/fatigue and diaphoresis.  HENT: Negative for hearing loss, ear pain, nosebleeds, congestion, sore throat,  neck pain, tinnitus and ear discharge.   Eyes: Negative for blurred vision, double vision, photophobia, pain, discharge and redness.  Respiratory: cough, hemoptysis, sputum production, shortness of breath, wheezing and stridor.   Cardiovascular: Negative for chest pain, palpitations, orthopnea, claudication, leg swelling and PND.  Gastrointestinal: Negative for heartburn, nausea, vomiting, abdominal pain, diarrhea, constipation, blood in stool and melena.  Genitourinary: Negative for dysuria, urgency, frequency, hematuria and flank pain.  Musculoskeletal: left flank pain, myalgias, back pain, joint pain and falls.  Skin: Negative for itching and rash.  Neurological: Negative for dizziness, tingling, tremors, sensory change, speech change, focal weakness, seizures, loss of consciousness, weakness and headaches.  Endo/Heme/Allergies: Negative for environmental allergies and polydipsia. Does not bruise/bleed easily.  SUBJECTIVE:  No complaints at this time states "I am hungry"  VITAL SIGNS: Temp:  [98.7 F (37.1 C)] 98.7 F (37.1 C) (04/19 1415) Pulse Rate:  [91-108] 91 (04/19 2000) Resp:  [26-40] 26 (04/19 2000) BP: (113-116)/(69-70) 114/70 (04/19 2000) SpO2:  [84 %-97 %] 97 % (04/19 2000) Weight:  [91.6 kg] 91.6 kg (04/19 1414)  PHYSICAL EXAMINATION: General: acutely ill appearing female, NAD on Bipap  Neuro: alert and oriented, follows commands, PERRL  HEENT: supple, no JVD  Cardiovascular: nsr, no R/G  Lungs: crackles and inspiratory wheezes throughout, even, slightly tachypneic  Abdomen: +BS x4, obese, soft, non distended, non tender Musculoskeletal: normal bulk and tone, no edema  Skin: intact no rashes or lesions present   Recent Labs  Lab 02/03/20 1423  NA 138  K 2.8*  CL 95*  CO2 33*  BUN 10  CREATININE 0.54  0.64  GLUCOSE 143*   Recent Labs  Lab 02/03/20 1423  HGB 13.6  13.8  HCT 45.4  45.4  WBC 18.1*  16.9*  PLT 164  172   DG Chest 1 View  Result  Date: 02/03/2020 CLINICAL DATA:  Dyspnea, shortness of breath, productive cough EXAM: CHEST  1 VIEW COMPARISON:  10/22/2019 FINDINGS: Cardiomegaly. Diffuse bilateral heterogeneous and interstitial airspace opacity. The visualized skeletal structures are unremarkable. IMPRESSION: Cardiomegaly with diffuse bilateral heterogeneous and interstitial airspace opacity, which may reflect multifocal infection or edema. Electronically Signed   By: Eddie Candle M.D.   On: 02/03/2020 15:38    ASSESSMENT / PLAN:  Acute on chronic hypoxic respiratory failure secondary to COVID-19 negative, mild pulmonary edema, and AECOPD  Hx: Current everyday smoker and Bipap qhs  Prn Bipap or supplemental O2 for dyspnea and/or hypoxia  Scheduled and prn bronchodilator therapy Continue decadron, remdesivir, and vitamins  Trend inflammatory marker and PCT  Follow cultures If PCT positive will continue abx therapy  Pulmonary hygiene and OOB to chair once respiratory status stabilized  IV lasix x1 dose: trend BMP, replace electrolytes as indicated, monitor UOP Smoking cessation counseling provided Maintain airborne and contact precautions   Best Practice: VTE px: subq lovenox SUP px: iv protonix  Diet: heart healthy diet   Marda Stalker, [place] Pager [phone] (please enter 7 digits) PCCM Consult Pager [phone] (please enter 7 digits)

## 2020-02-03 NOTE — ED Notes (Signed)
Respiratory called for Bpap. Per Dr. Jimmye Norman.

## 2020-02-03 NOTE — ED Provider Notes (Signed)
[facility] Emergency Department Provider Note       Time seen: ----------------------------------------- 2:29 PM on 02/03/2020 -----------------------------------------  I have reviewed the triage vital signs and the nursing notes.  HISTORY   Chief Complaint Respiratory Distress    HPI Theresa [name] is a 64 y.o. female with a history of anxiety, CHF, COPD, depression, GERD who presents to the ED for shortness of breath over the last week with productive cough.  Patient denies any fevers, states she usually wears 2-1/2 L of oxygen at baseline.  She presents with hypoxia and tachypnea.  Past Medical History:  Diagnosis Date  . Acute postoperative pain 10/03/2017  . Anxiety   . BiPAP (biphasic positive airway pressure) dependence    at hs  . Carpal tunnel syndrome   . CHF (congestive heart failure) ([place])    1/18  . CHF (congestive heart failure) ([place])   . Chronic generalized abdominal pain   . Chronic rhinitis   . COPD (chronic obstructive pulmonary disease) ([place])   . DDD (degenerative disc disease), cervical   . DDD (degenerative disc disease), lumbosacral   . Depression   . Dyspnea   . Edema   . Flu    1/18  . Gastritis   . GERD (gastroesophageal reflux disease)   . Hematuria   . Hernia of abdominal wall 07/17/2018  . Hernia, abdominal   . Hypertension   . Kidney stones   . Low back pain   . Lumbar radiculitis   . Malodorous urine   . Muscle weakness   . Obesity   . Oxygen dependent   . Renal cyst   . Sensory urge incontinence   . Thyroid activity decreased    9/19  . Tobacco abuse   . Wheezing     Patient Active Problem List   Diagnosis Date Noted  . Numbness and tingling in right hand 12/31/2019  . Numbness in feet 12/31/2019  . Altered consciousness 11/26/2019  . Frequent falls 11/26/2019  . Weakness of both lower extremities 11/26/2019  . Protruded lumbar disc (L2-3) (Left) 09/10/2019  . Chronic knee pain (Left)  08/14/2019  . Chronic lumbar radiculopathy/radiculitis (L2) (Left) 08/14/2019  . Abnormal MRI, lumbar spine (07/23/2019) 08/14/2019  . Acute on chronic respiratory failure with hypoxia ([place]) 07/06/2019  . Cervicalgia (Bilateral) (R>L) 09/24/2018  . Spondylosis without myelopathy or radiculopathy, lumbosacral region 09/24/2018  . Epigastric hernia 05/04/2018  . Chronic lower extremity pain (Bilateral) (L>R) 01/25/2018  . Altered mental status, unspecified 12/09/2017  . Chronic hip pain [facility] Pain) (Bilateral) (L>R) 11/27/2017  . COPD (chronic obstructive pulmonary disease) ([place]) 08/11/2017  . Other long term (current) drug therapy 08/08/2017  . Disorder of bone, unspecified 08/08/2017  . COPD with hypoxia ([place]) 06/27/2017  . Arthralgia of acromioclavicular joint (Right) 06/15/2017  . Acromioclavicular joint DJD (Right) 06/15/2017  . 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  . Chronic shoulder pain (Left) 05/11/2017  . Elevated brain natriuretic peptide (BNP) level 05/11/2017  . Grief at loss of child 02/14/2017  . Lumbar facet hypertrophy (multilevel) 11/17/2016  . Lumbar spinal stenosis (L4-5) 11/17/2016  . 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) 11/17/2016  . 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 Lumbosacral radiculopathy at L4 11/17/2016  . Supplemental oxygen dependent 08/01/2016  . Sleep apnea 06/02/2016  . Long term current use of opiate analgesic 01/12/2016  . Long term prescription opiate use 01/12/2016  . Opiate use (15 MME/Day) 01/12/2016  . Lumbar  facet syndrome (Bilateral) (L>R) 01/12/2016  . Chronic sacroiliac joint pain (Bilateral) 01/12/2016  . Vitamin D deficiency 12/30/2015  . 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 Obesity, Class III, BMI 40-49.9 (morbid obesity) ([place]) 12/28/2015  . 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[place]) 11/19/2015  . Coagulation disorder ([place]) 10/22/2015  . Chronic pain  syndrome (significant psychosocial component) 09/21/2015  . 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 09/21/2015  . Encounter for therapeutic drug level monitoring 09/16/2015  . Encounter for chronic pain management 09/16/2015  . Pain management 09/16/2015  . Neurogenic pain 09/16/2015  . Neuropathic pain 09/16/2015  . Musculoskeletal pain 09/16/2015  . Lumbar spondylosis (L4-5) 09/16/2015  . Chronic low back pain (Primary Area of Pain) (Bilateral) (L>R) 09/16/2015  . Chronic lower extremity pain (Secondary Area of Pain) (Left) 09/16/2015  . Chronic lumbar radicular pain (L4 & S1 Dermatome) (Left) 09/16/2015  . Chronic shoulder pain (Right side) 09/16/2015  . Chronic upper extremity pain (Right-sided) 09/16/2015  . Cervical radiculitis (Right side) 09/16/2015  . Abnormal x-ray of lumbar spine 09/16/2015  . Chronic diastolic heart failure ([place]) 07/15/2015  . Chest pain 07/15/2015  . Tobacco abuse 07/15/2015  . Blood clotting disorder ([place]) 04/21/2015  . Decreased motor strength 07/16/2014  . Clinical depression 07/16/2014  . Chronic rhinitis 07/16/2014  . Carpal tunnel syndrome 07/16/2014  . Anxiety state 07/16/2014  . Neuritis or radiculitis due to rupture of lumbar intervertebral disc 07/02/2014  . DDD (degenerative disc disease), lumbar 07/02/2014  . CAFL (chronic airflow limitation) ([place]) 02/27/2014  . Essential (primary) hypertension 03/27/2008    Past Surgical History:  Procedure Laterality Date  . ABDOMINAL HYSTERECTOMY    . CARPAL TUNNEL RELEASE Left 2012  . EPIGASTRIC HERNIA REPAIR N/A 08/13/2018   HERNIA REPAIR EPIGASTRIC ADULT; polypropylene mesh reinforcement.  Surgeon: Robert Bellow, MD;  Location: [facility] ORS;  Service: General;  Laterality: N/A;  . RIGHT/LEFT HEART CATH AND CORONARY ANGIOGRAPHY N/A 07/11/2019   Procedure: RIGHT/LEFT HEART CATH AND CORONARY ANGIOGRAPHY;  Surgeon: Yolonda Kida, MD;  Location: [place] [facility] LAB;   Service: Cardiovascular;  Laterality: N/A;  . TONSILLECTOMY    . TUBAL LIGATION    . UMBILICAL HERNIA REPAIR N/A 08/13/2018   HERNIA REPAIR UMBILICAL ADULT; polypropylene mesh reinforcement Surgeon: Robert Bellow, MD;  Location: [facility] ORS;  Service: General;  Laterality: N/A;    Allergies Codeine and Meloxicam  Social History Social History   Tobacco Use  . Smoking status: Current Every Day Smoker    Packs/day: 0.50    Years: 44.00    Pack years: 22.00    Types: Cigarettes  . Smokeless tobacco: Never Used  . Tobacco comment: over a half pack  Substance Use Topics  . Alcohol use: No  . Drug use: No    Review of Systems Constitutional: Negative for fever. Cardiovascular: Negative for chest pain. Respiratory: Positive for shortness of breath, cough Gastrointestinal: Negative for abdominal pain, vomiting and diarrhea. Musculoskeletal: Negative for back pain. Skin: Negative for rash. Neurological: Negative for headaches, focal weakness or numbness.  All systems negative/normal/unremarkable except as stated in the HPI  ____________________________________________   PHYSICAL EXAM:  VITAL SIGNS: ED Triage Vitals  Enc Vitals Group     BP 02/03/20 1415 116/69     Pulse Rate 02/03/20 1415 (!) 108     Resp 02/03/20 1415 (!) 40     Temp 02/03/20 1415 98.7 F (37.1 C)     Temp Source 02/03/20 1415 Oral  SpO2 02/03/20 1415 (!) 84 %     Weight 02/03/20 1414 202 lb (91.6 kg)     Height 02/03/20 1414 _0  (1.575 m)     Head Circumference --      Peak Flow --      Pain Score 02/03/20 1419 10     Pain Loc --      Pain Edu? --      Excl. in [place]? --     Constitutional: Alert and oriented.  Mild to moderate distress Eyes: Conjunctivae are normal. Normal extraocular movements. ENT      Head: Normocephalic and atraumatic.      Nose: No congestion/rhinnorhea.      Mouth/Throat: Mucous membranes are moist.      Neck: No stridor. Cardiovascular: Normal rate, regular  rhythm. No murmurs, rubs, or gallops. Respiratory: Normal respiratory effort without tachypnea nor retractions. Breath sounds are clear and equal bilaterally. No wheezes/rales/rhonchi. Gastrointestinal: Soft and nontender. Normal bowel sounds Musculoskeletal: Nontender with normal range of motion in extremities. No lower extremity tenderness nor edema. Neurologic:  Normal speech and language. No gross focal neurologic deficits are appreciated.  Skin:  Skin is warm, dry and intact. No rash noted. Psychiatric: Mood and affect are normal. Speech and behavior are normal.  ____________________________________________  EKG: Interpreted by me.  Nonspecific rhythm, there are some sinus beats with a rate of 108 bpm, left axis deviation, normal QT  ____________________________________________  ED COURSE:  As part of my medical decision making, I reviewed the following data within the [place] History obtained from family if available, nursing notes, old chart and ekg, as well as notes from prior ED visits. Patient presented for dyspnea, we will assess with labs and imaging as indicated at this time.   Procedures  Theresa [name] was evaluated in Emergency Department on 02/03/2020 for the symptoms described in the history of present illness. She was evaluated in the context of the global COVID-19 pandemic, which necessitated consideration that the patient might be at risk for infection with the SARS-CoV-2 virus that causes COVID-19. Institutional protocols and algorithms that pertain to the evaluation of patients at risk for COVID-19 are in a state of rapid change based on information released by regulatory bodies including the CDC and federal and state organizations. These policies and algorithms were followed during the patient's care in the ED.  ____________________________________________   LABS (pertinent positives/negatives)  Labs Reviewed  BASIC METABOLIC PANEL - Abnormal;  Notable for the following components:      Result Value   Potassium 2.8 (*)    Chloride 95 (*)    CO2 33 (*)    Glucose, Bld 143 (*)    All other components within normal limits  CBC - Abnormal; Notable for the following components:   WBC 16.9 (*)    RDW 19.6 (*)    All other components within normal limits  TROPONIN I (HIGH SENSITIVITY) - Abnormal; Notable for the following components:   Troponin I (High Sensitivity) 25 (*)    All other components within normal limits  RESPIRATORY PANEL BY RT PCR (FLU A&B, COVID)  CULTURE, BLOOD (ROUTINE X 2)  CULTURE, BLOOD (ROUTINE X 2)  BRAIN NATRIURETIC PEPTIDE  LACTIC ACID, PLASMA  LACTIC ACID, PLASMA   CRITICAL CARE Performed by: Laurence Aly   Total critical care time: 30 minutes  Critical care time was exclusive of separately billable procedures and treating other patients.  Critical care was necessary to treat or  prevent imminent or life-threatening deterioration.  Critical care was time spent personally by me on the following activities: development of treatment plan with patient and/or surrogate as well as nursing, discussions with consultants, evaluation of patient's response to treatment, examination of patient, obtaining history from patient or surrogate, ordering and performing treatments and interventions, ordering and review of laboratory studies, ordering and review of radiographic studies, pulse oximetry and re-evaluation of patient's condition.  RADIOLOGY Images were viewed by me  Chest x-ray IMPRESSION: Cardiomegaly with diffuse bilateral heterogeneous and interstitial airspace opacity, which may reflect multifocal infection or edema. ____________________________________________   DIFFERENTIAL DIAGNOSIS   CHF, COPD, pneumonia, COVID-19, PE, pneumothorax  FINAL ASSESSMENT AND PLAN  Acute respiratory failure   Plan: The patient had presented for shortness of breath and hypoxia. Patient's labs did reveal  leukocytosis as well as elevated troponin. Patient's imaging revealed bilateral heterogeneous and interstitial opacities which could be infection or edema.  Initially she was given nebs and steroids followed by antibiotics and Lasix.  She has been placed on BiPAP.  Covid testing is pending.   Laurence Aly, MD    Note: This note was generated in part or whole with voice recognition software. Voice recognition is usually quite accurate but there are transcription errors that can and very often do occur. I apologize for any typographical errors that were not detected and corrected.     Earleen Newport, MD 02/03/20 [phone]

## 2020-02-03 NOTE — ED Notes (Signed)
Respiratory called to put patient back on bipap so she can sleep

## 2020-02-03 NOTE — Progress Notes (Signed)
Pt. Weaned off BIPAP to 10L High Flow Nasal Cannula maintaining O2 Saturation greater than 90%. No distress noted.will continue to assess.

## 2020-02-03 NOTE — ED Notes (Signed)
Messaged on-call NP as patient is unable to take PO meds due to being on BiPAP

## 2020-02-03 NOTE — ED Triage Notes (Signed)
Patient reports worsening SOB x1 week with productive cough. Denies fevers. Patient states she usually wears 2.5L O2 at baseline. Patient with hx of CHF and COPD. Rapid shallow respirations noted. Patient appears pale and fatigued.

## 2020-02-04 DIAGNOSIS — U071 COVID-19: Secondary | ICD-10-CM | POA: Diagnosis not present

## 2020-02-04 DIAGNOSIS — J1282 Pneumonia due to Coronavirus disease 2019: Secondary | ICD-10-CM | POA: Diagnosis not present

## 2020-02-04 DIAGNOSIS — E876 Hypokalemia: Secondary | ICD-10-CM | POA: Diagnosis not present

## 2020-02-04 LAB — COMPREHENSIVE METABOLIC PANEL
ALT: 20 U/L (ref 0–44)
AST: 26 U/L (ref 15–41)
Albumin: 2.6 g/dL — ABNORMAL LOW (ref 3.5–5.0)
Alkaline Phosphatase: 98 U/L (ref 38–126)
Anion gap: 11 (ref 5–15)
BUN: 12 mg/dL (ref 8–23)
CO2: 31 mmol/L (ref 22–32)
Calcium: 8.3 mg/dL — ABNORMAL LOW (ref 8.9–10.3)
Chloride: 98 mmol/L (ref 98–111)
Creatinine, Ser: 0.54 mg/dL (ref 0.44–1.00)
GFR calc Af Amer: >60 mL/min (ref >60)
GFR calc non Af Amer: >60 mL/min (ref >60)
Glucose, Bld: 140 mg/dL — ABNORMAL HIGH (ref 70–99)
Potassium: 3.3 mmol/L — ABNORMAL LOW (ref 3.5–5.1)
Sodium: 140 mmol/L (ref 135–145)
Total Bilirubin: 0.7 mg/dL (ref 0.3–1.2)
Total Protein: 6.7 g/dL (ref 6.5–8.1)

## 2020-02-04 LAB — CBC WITH DIFFERENTIAL/PLATELET
Abs Immature Granulocytes: 0.12 10*3/uL — ABNORMAL HIGH (ref 0.00–0.07)
Basophils Absolute: 0 10*3/uL (ref 0.0–0.1)
Basophils Relative: 0 %
Eosinophils Absolute: 0 10*3/uL (ref 0.0–0.5)
Eosinophils Relative: 0 %
HCT: 41.4 % (ref 36.0–46.0)
Hemoglobin: 12.8 g/dL (ref 12.0–15.0)
Immature Granulocytes: 1 %
Lymphocytes Relative: 8 %
Lymphs Abs: 0.9 10*3/uL (ref 0.7–4.0)
MCH: 27.5 pg (ref 26.0–34.0)
MCHC: 30.9 g/dL (ref 30.0–36.0)
MCV: 89 fL (ref 80.0–100.0)
Monocytes Absolute: 0.3 10*3/uL (ref 0.1–1.0)
Monocytes Relative: 3 %
Neutro Abs: 9.5 10*3/uL — ABNORMAL HIGH (ref 1.7–7.7)
Neutrophils Relative %: 88 %
Platelets: 154 10*3/uL (ref 150–400)
RBC: 4.65 MIL/uL (ref 3.87–5.11)
RDW: 19.6 % — ABNORMAL HIGH (ref 11.5–15.5)
Smear Review: NORMAL
WBC: 10.8 10*3/uL — ABNORMAL HIGH (ref 4.0–10.5)
nRBC: 0 % (ref 0.0–0.2)

## 2020-02-04 LAB — MRSA PCR SCREENING: MRSA by PCR: NEGATIVE

## 2020-02-04 LAB — GLUCOSE, CAPILLARY
Glucose-Capillary: 135 mg/dL — ABNORMAL HIGH (ref 70–99)
Glucose-Capillary: 112 mg/dL — ABNORMAL HIGH (ref 70–99)
Glucose-Capillary: 132 mg/dL — ABNORMAL HIGH (ref 70–99)
Glucose-Capillary: 122 mg/dL — ABNORMAL HIGH (ref 70–99)

## 2020-02-04 LAB — FIBRIN DERIVATIVES D-DIMER (ARMC ONLY): Fibrin derivatives D-dimer (ARMC): 2470.52 ng/mL (FEU) — ABNORMAL HIGH (ref 0.00–499.00)

## 2020-02-04 LAB — MAGNESIUM: Magnesium: 2.2 mg/dL (ref 1.7–2.4)

## 2020-02-04 LAB — HEPATITIS B SURFACE ANTIGEN: Hepatitis B Surface Ag: NONREACTIVE

## 2020-02-04 LAB — HEMOGLOBIN A1C
Hgb A1c MFr Bld: 6.4 % — ABNORMAL HIGH (ref 4.8–5.6)
Mean Plasma Glucose: 136.98 mg/dL

## 2020-02-04 LAB — C-REACTIVE PROTEIN: CRP: 19.9 mg/dL — ABNORMAL HIGH (ref <1.0)

## 2020-02-04 LAB — PHOSPHORUS: Phosphorus: 3.4 mg/dL (ref 2.5–4.6)

## 2020-02-04 LAB — FERRITIN: Ferritin: 183 ng/mL (ref 11–307)

## 2020-02-04 IMAGING — DX DG ANKLE COMPLETE 3+V*R*
3 series · 3 of 3 positions shown · non-contrast
Comparison: None.

CLINICAL DATA: Fall. Ankle pain and swelling.  Initial encounter.

EXAM:
RIGHT ANKLE - COMPLETE 3+ VIEW

[ankle ap]
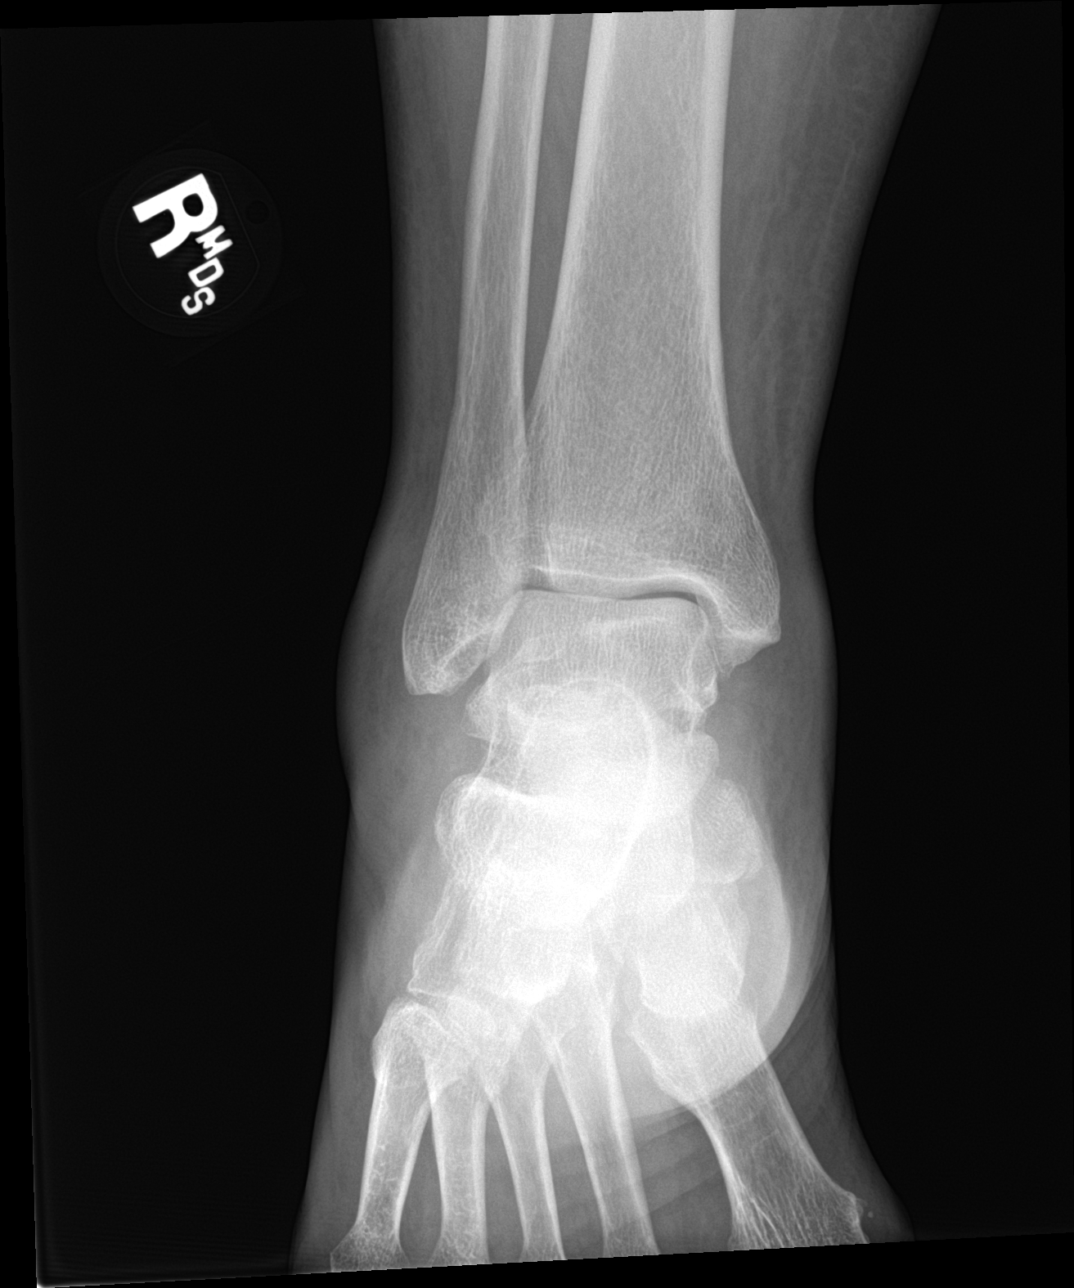

[ankle obl]
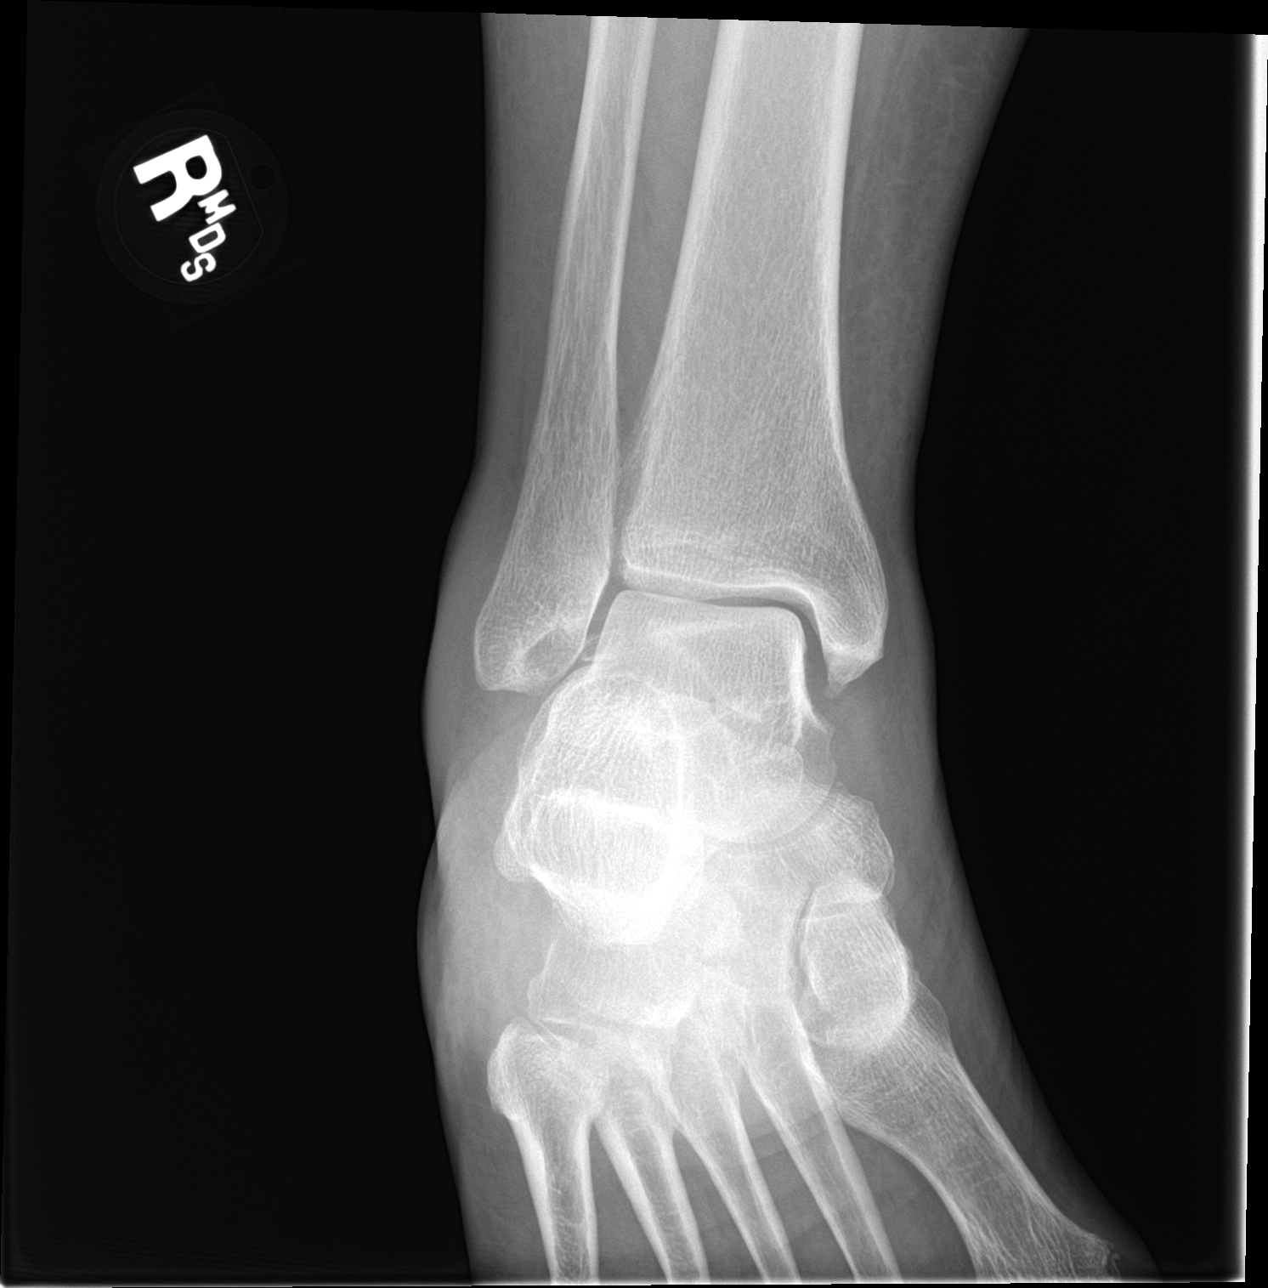

[ankle lat]
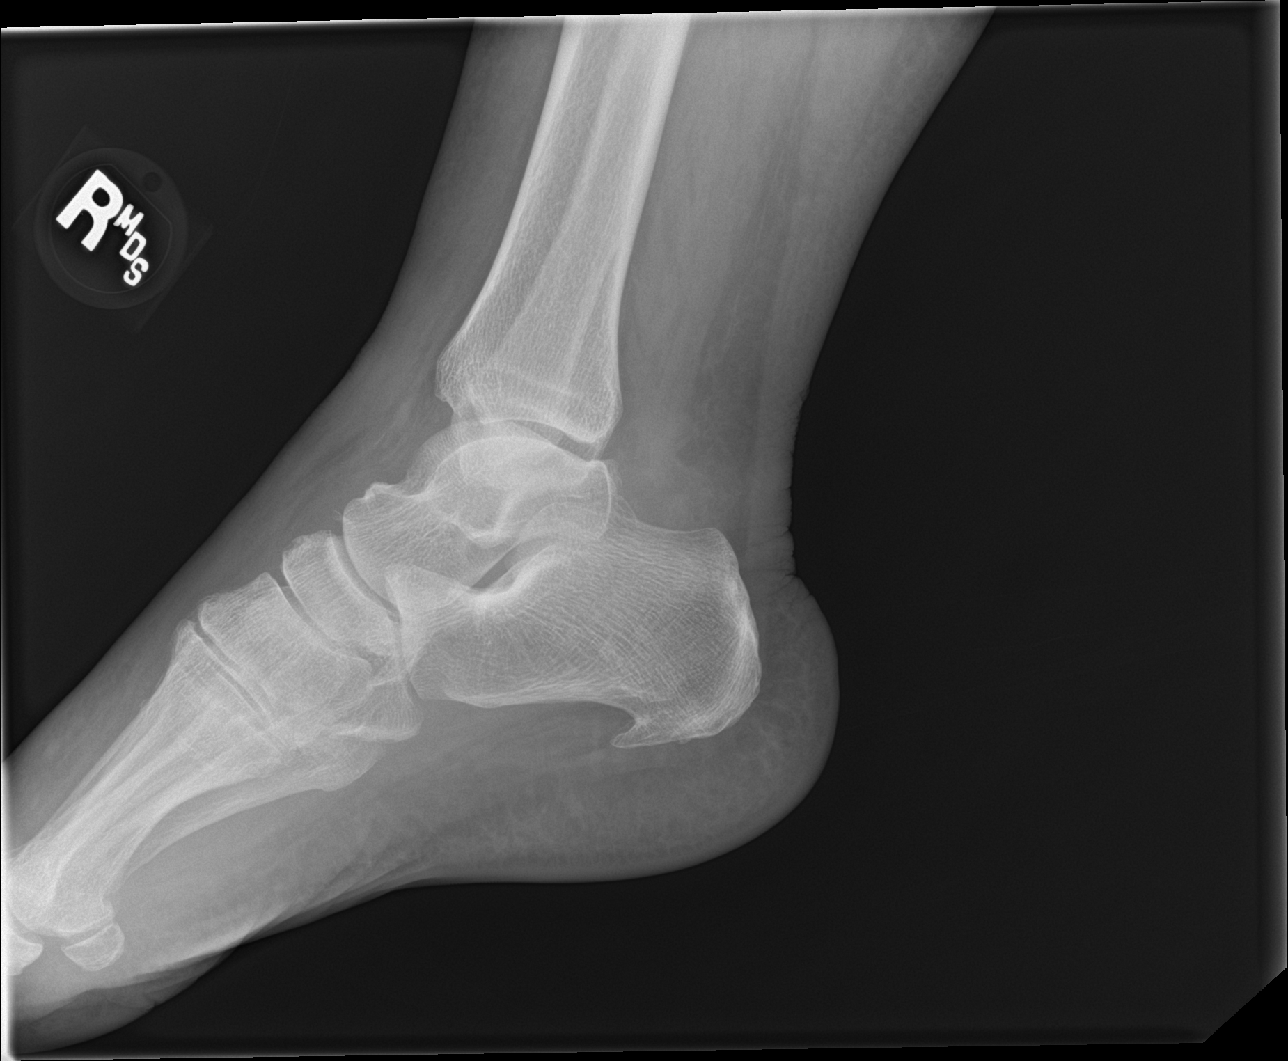

[3 of 3 positions shown; findings below may reference images not displayed]

FINDINGS: Mild diffuse soft tissue swelling is noted. No evidence of ankle
joint effusion. No evidence of acute fracture or dislocation.
Prominent plantar calcaneal bone spur noted.
IMPRESSION: Mild diffuse soft tissue swelling. No evidence of acute fracture or
dislocation.

## 2020-02-04 IMAGING — DX DG FOOT COMPLETE 3+V*R*
3 series · 3 of 3 positions shown · non-contrast
Comparison: None.

CLINICAL DATA: Fall. Right foot injury and pain. Initial encounter.

EXAM:
RIGHT FOOT COMPLETE - 3+ VIEW

[foot ap]
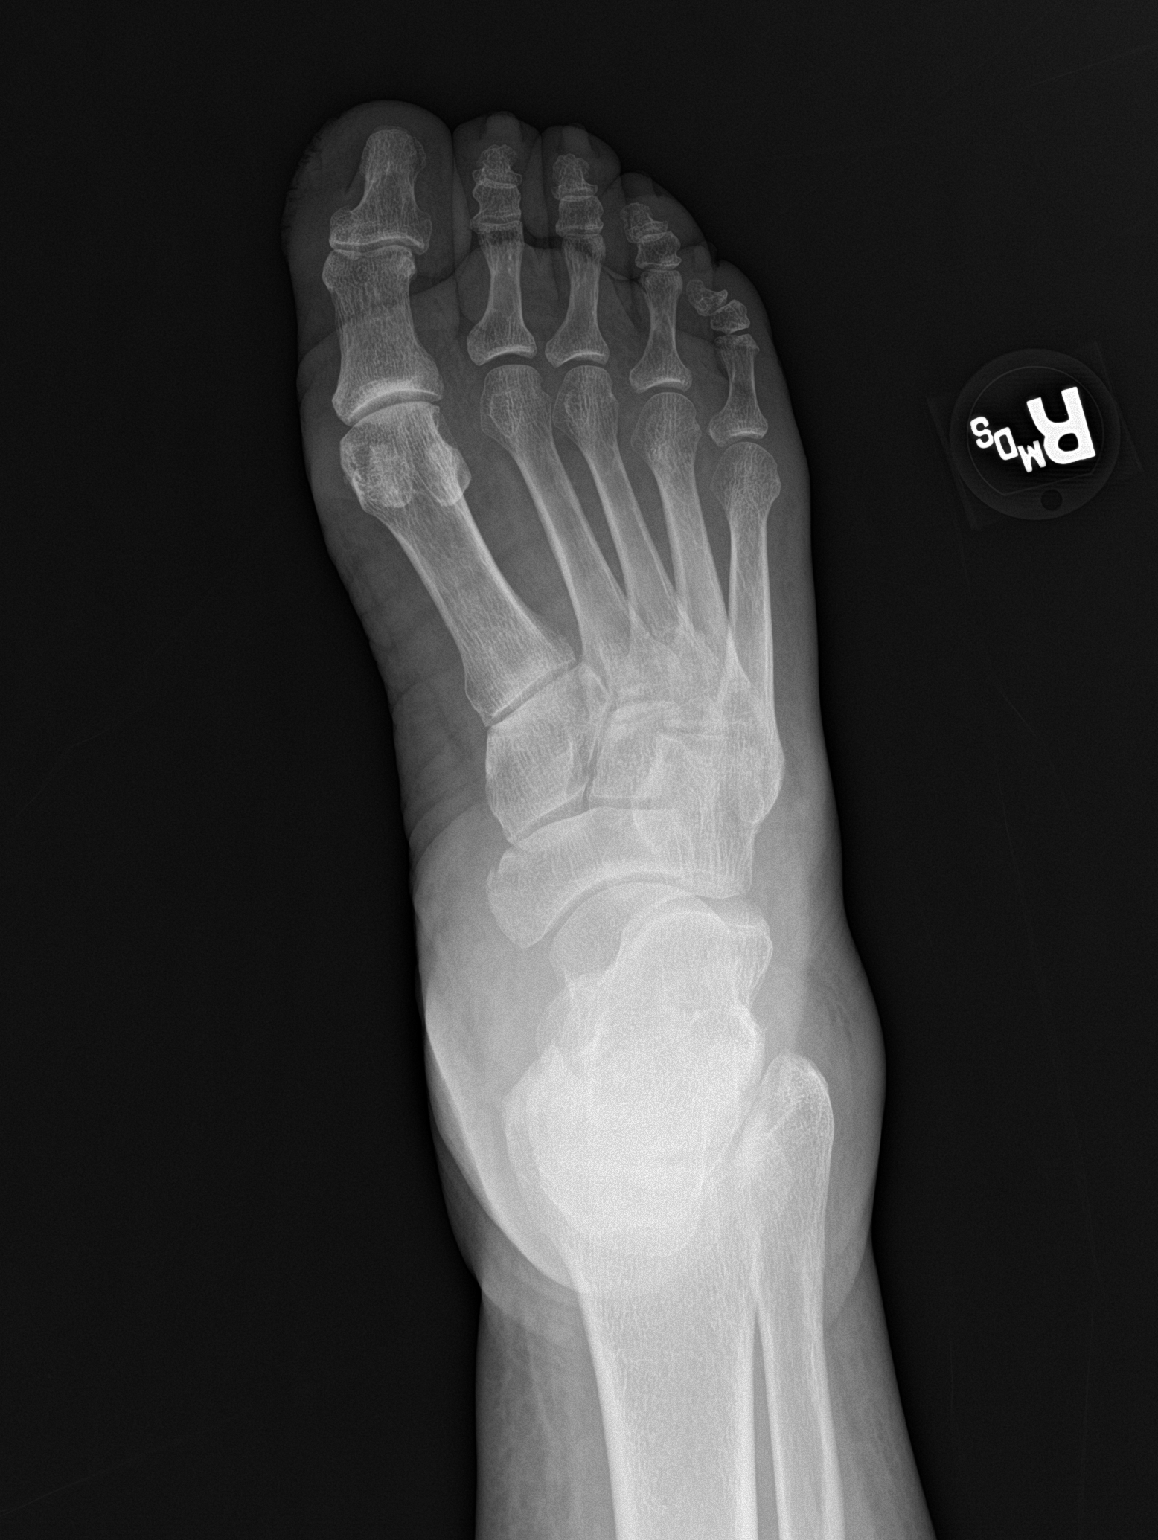

[foot lat]
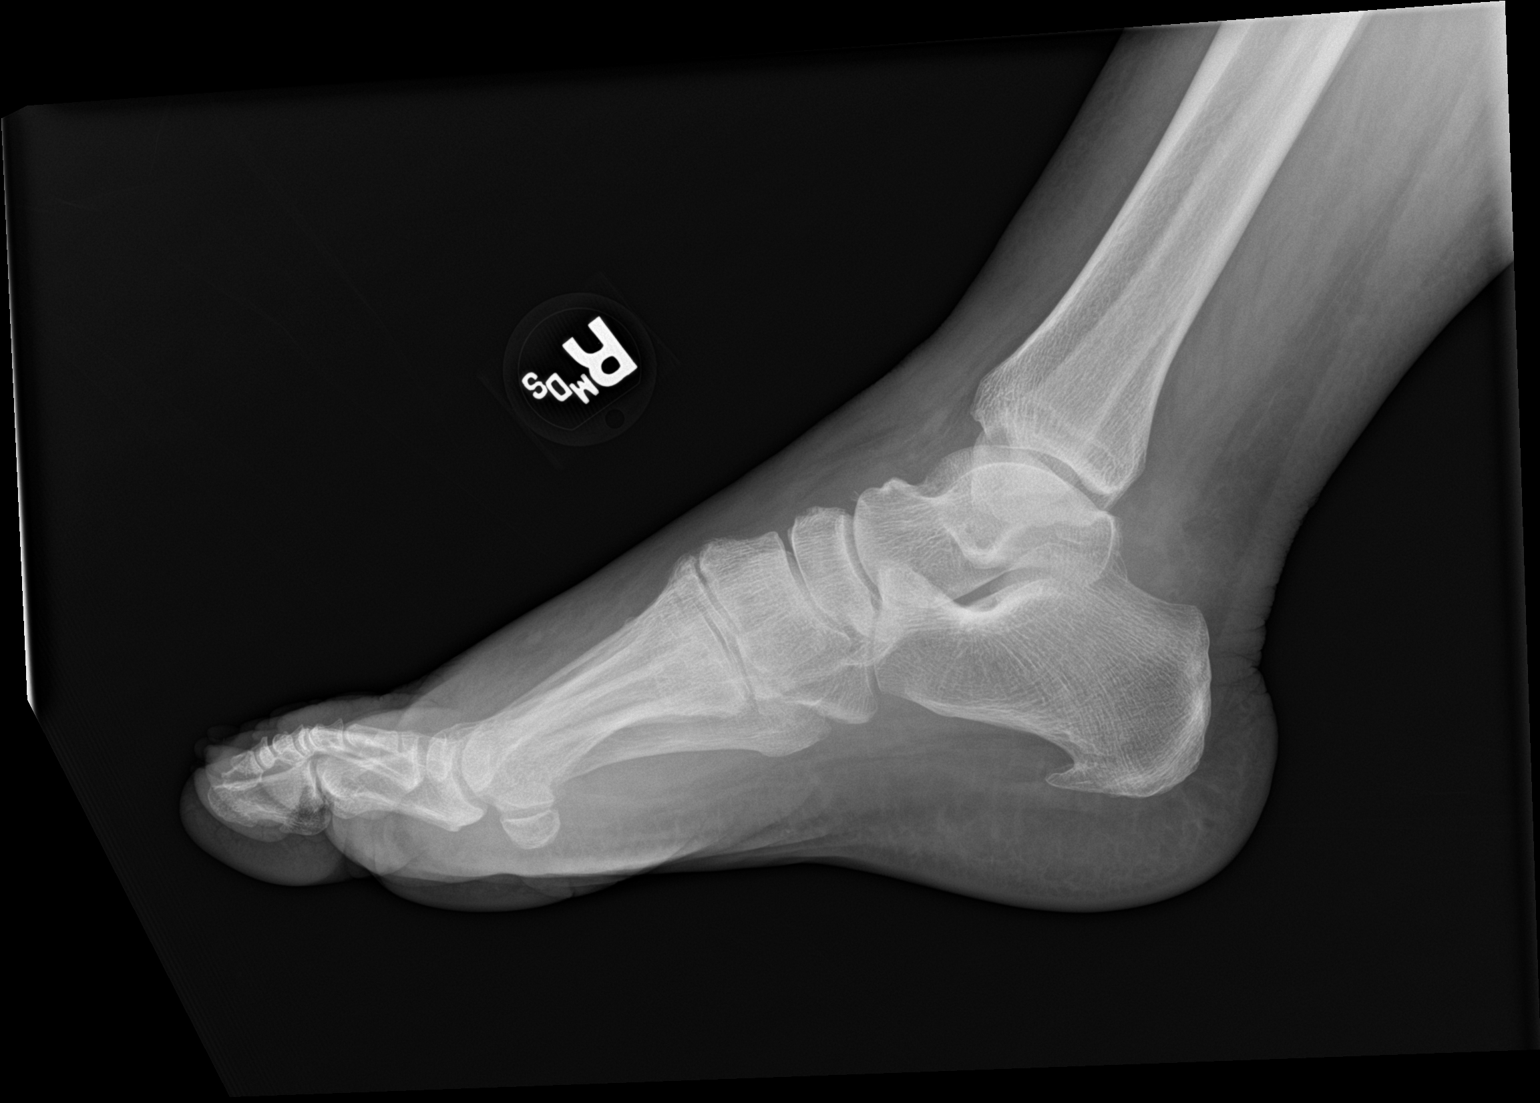

[foot obl]
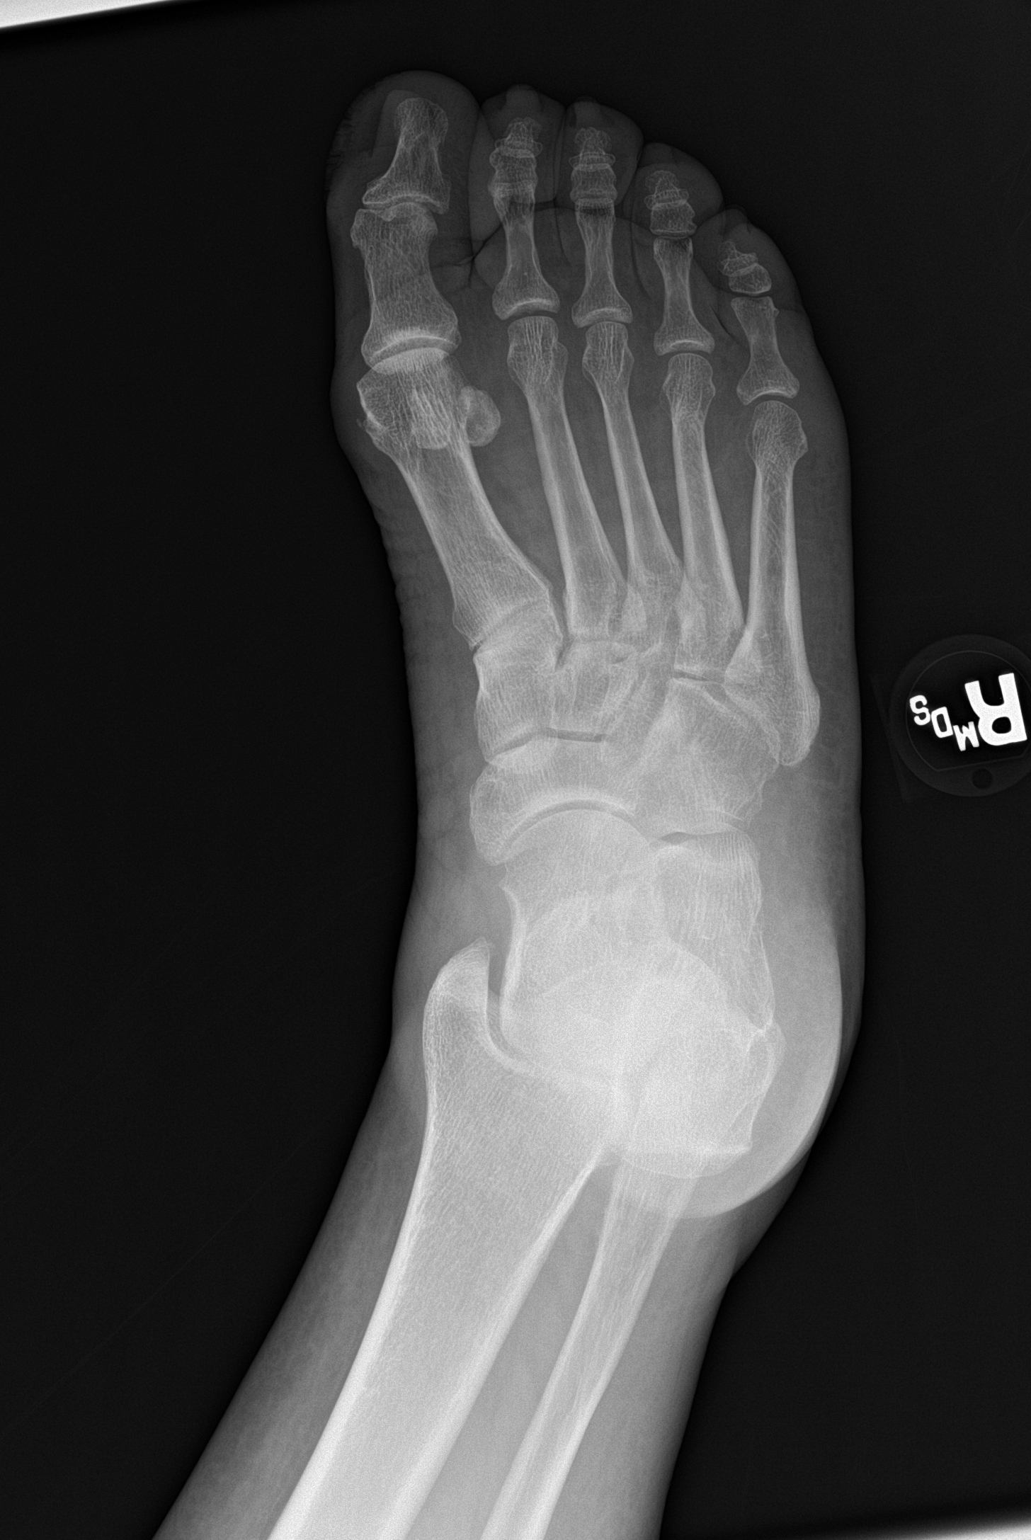

[3 of 3 positions shown; findings below may reference images not displayed]

FINDINGS: There is no evidence of fracture or dislocation. There is no
evidence of arthropathy. Prominent plantar calcaneal bone spur
noted. Soft tissue swelling is seen dorsal and lateral aspect of the
foot.
IMPRESSION: Dorsal and lateral soft tissue swelling. No evidence of fracture or
dislocation.

## 2020-02-04 IMAGING — MR MR THORACIC SPINE WO/W CM
1 series · 20 of 39 positions shown · IV contrast (gadavist)
Comparison: 09/30/2010

CLINICAL DATA: Back pain. Recent fall

EXAM:
MRI THORACIC WITHOUT AND WITH CONTRAST
TECHNIQUE: Multiplanar and multiecho pulse sequences of the thoracic spine were
obtained without and with intravenous contrast.
CONTRAST:  10mL GADAVIST GADOBUTROL 1 MMOL/ML IV SOLN

[Series 6: T1 post-contrast · axial · non-contrast · 4.0mm · 0.37mm/px · z∈[-374,-158]mm · 20 of 39 slices shown]
[im 1/39]
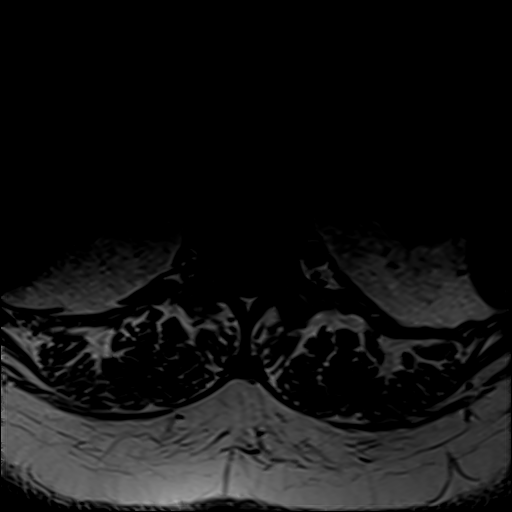
[im 2/39]
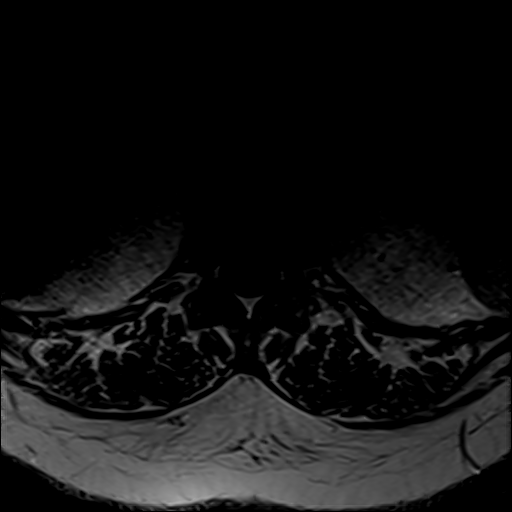
[im 3/39]
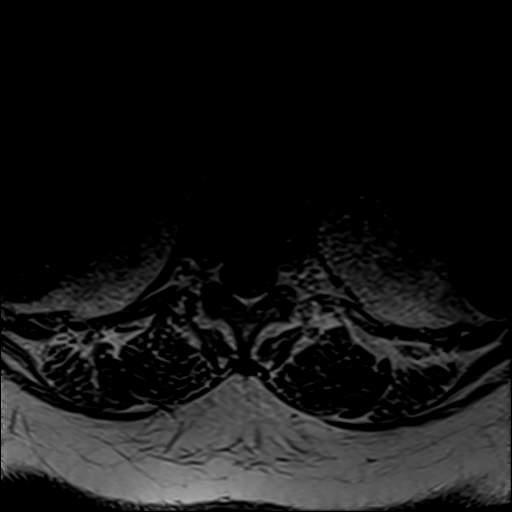
[im 4/39]
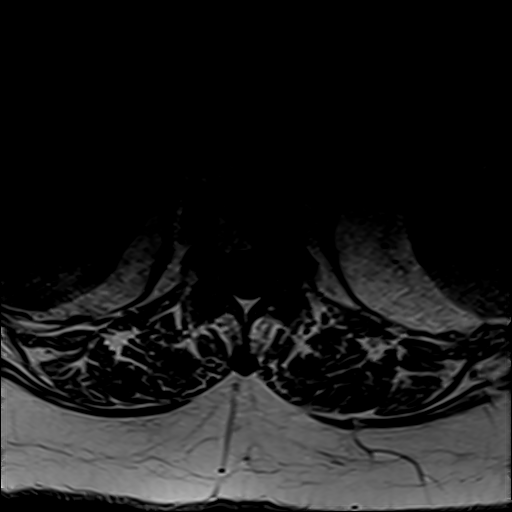
[im 5/39]
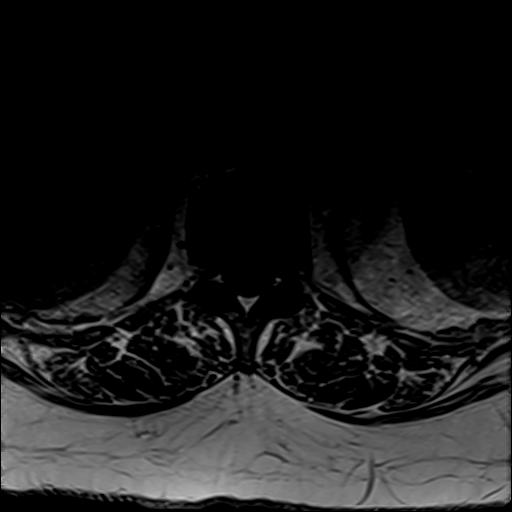
[im 6/39]
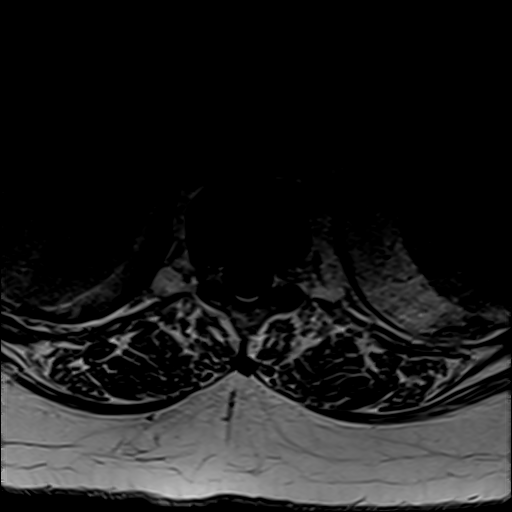
[im 7/39]
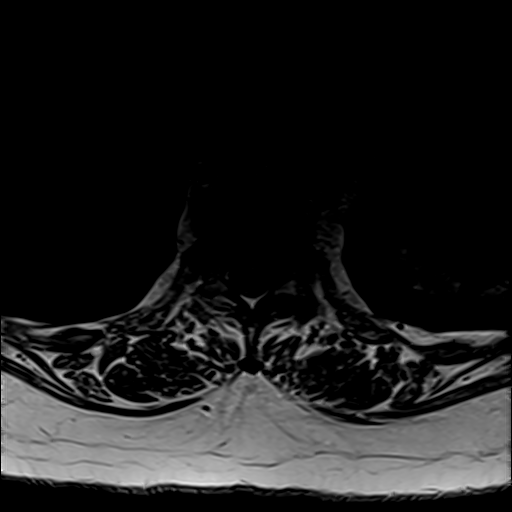
[im 8/39]
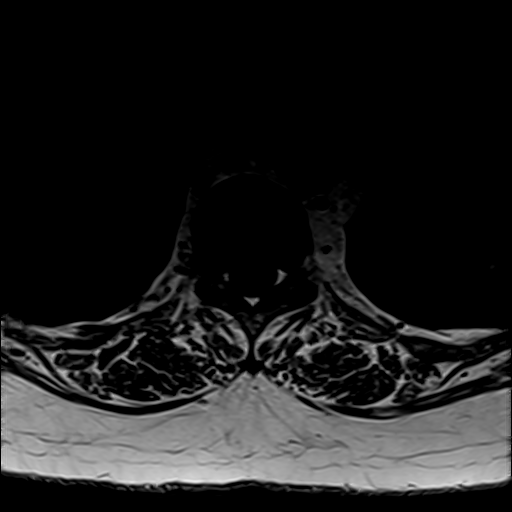
[im 9/39]
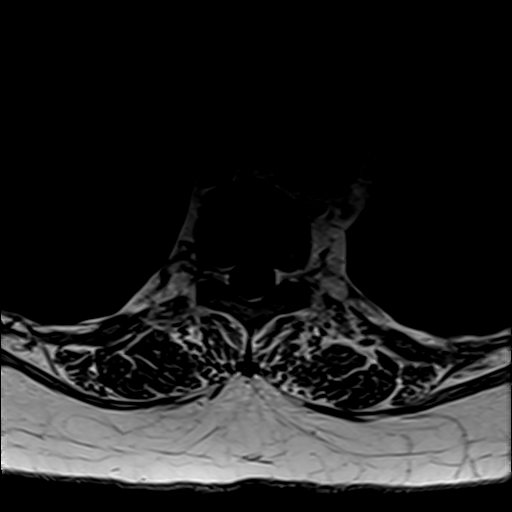
[im 10/39]
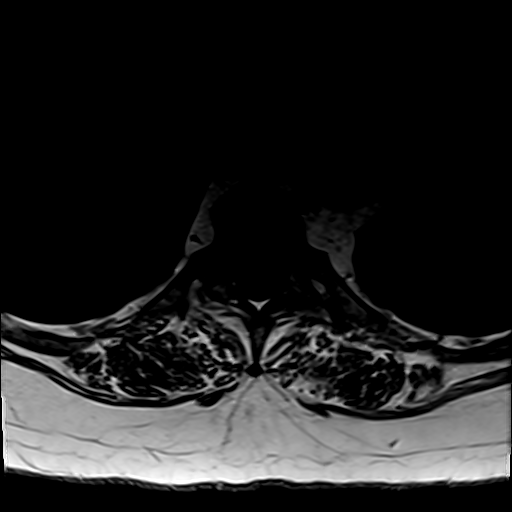
[im 11/39]
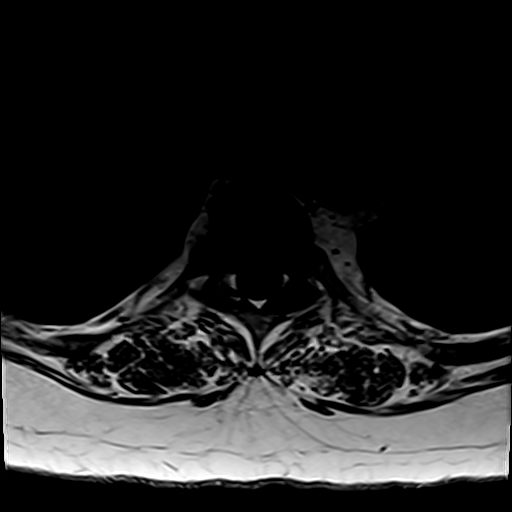
[im 12/39]
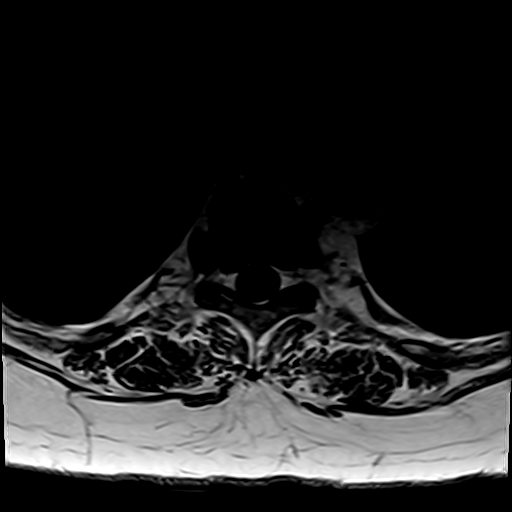
[im 13/39]
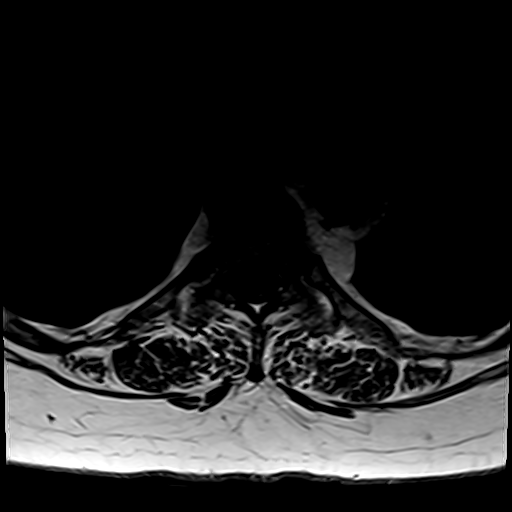
[im 18/39]
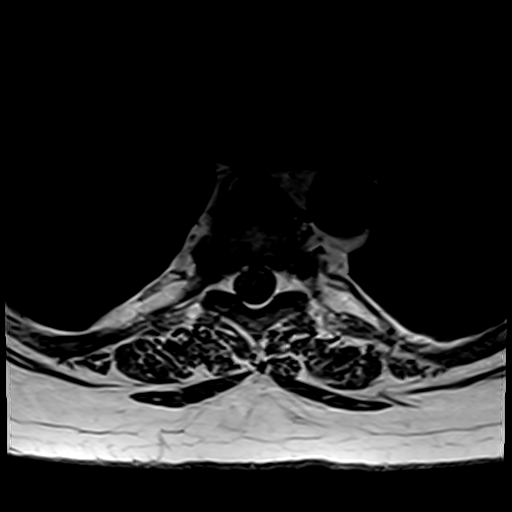
[im 20/39]
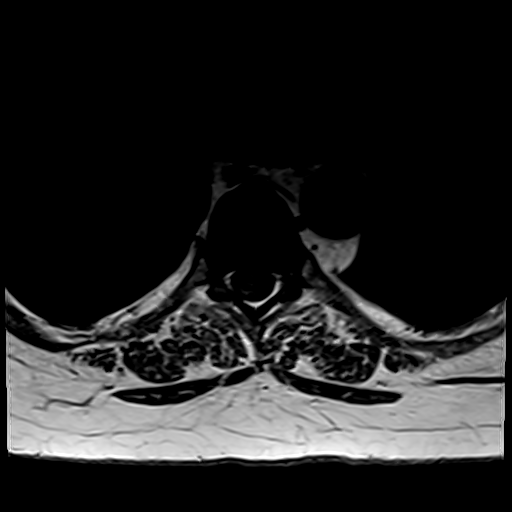
[im 22/39]
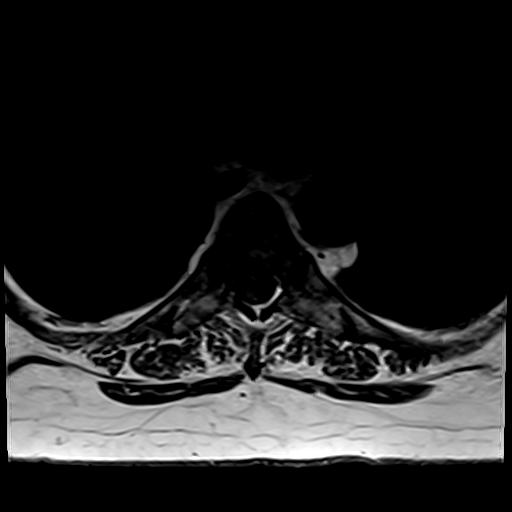
[im 27/39]
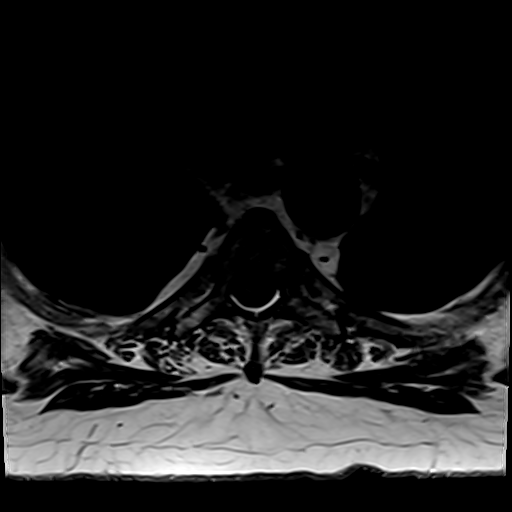
[im 32/39]
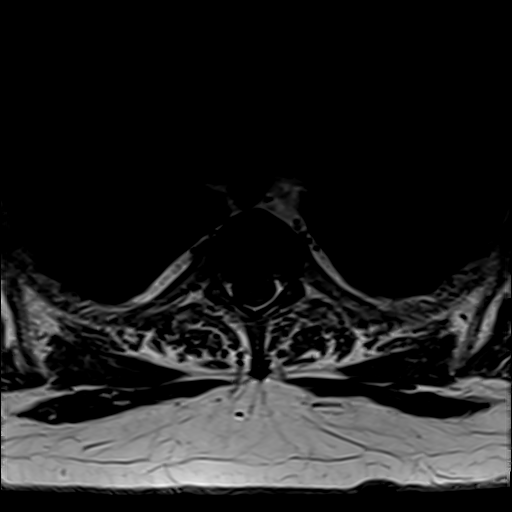
[im 33/39]
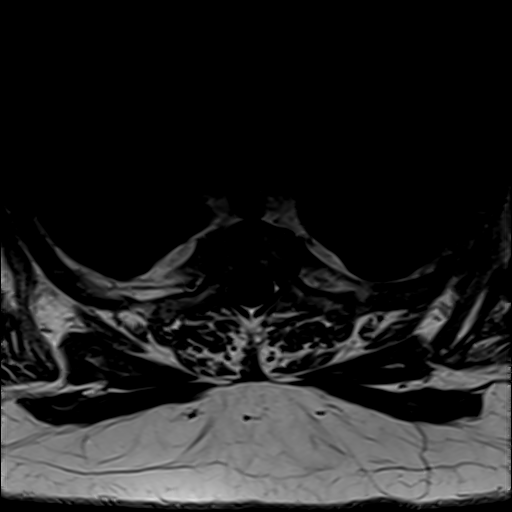
[im 37/39]
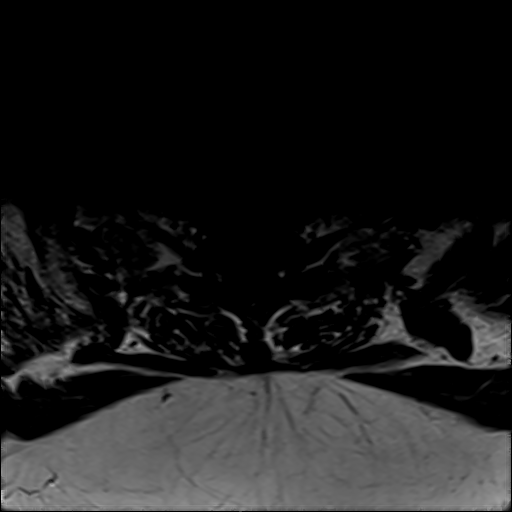

[20 of 39 positions shown; findings below may reference images not displayed]

FINDINGS: MRI THORACIC SPINE FINDINGS

Alignment:  Physiologic.

Vertebrae: T12 meningioma. No fracture

Cord:  Normal signal and morphology.

Paraspinal and other soft tissues: Negative.

Disc levels:

Small right subarticular disc protrusion at T7-T8 without associated
stenosis. There is otherwise mild degenerative disc disease but no
neural foraminal or spinal canal stenosis.
IMPRESSION: 1. No acute abnormality of the thoracic spine.
2. Mild thoracic degenerative disc disease without spinal canal or
neural foraminal stenosis.

## 2020-02-04 IMAGING — MR MR LUMBAR SPINE WO/W CM
5 of 7 series · 31 of 48 positions shown · IV contrast (10ml Gadavist)
Comparison: None.

CLINICAL DATA: Worsening back pain and multiple falls

EXAM:
MRI LUMBAR SPINE WITHOUT AND WITH CONTRAST
TECHNIQUE: Multiplanar and multiecho pulse sequences of the lumbar spine were
obtained without and with intravenous contrast.
CONTRAST:  10mL GADAVIST GADOBUTROL 1 MMOL/ML IV SOLN

[Series 1: T2 · sagittal · 4.0mm · 1.02mm/px · 5 of 15 slices shown (1 of 2)]
[im 1/15]
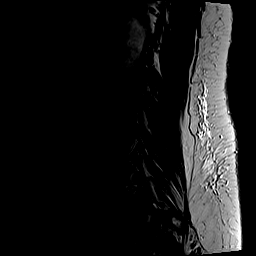
[im 4/15]
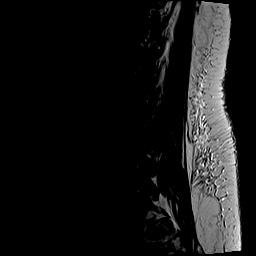
[im 8/15]
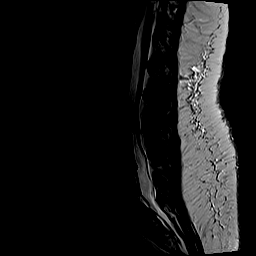
[im 11/15]
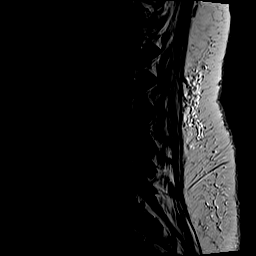
[im 15/15]
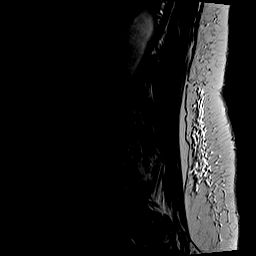

[Series 2: T1 · sagittal · 4.0mm · 1.02mm/px · 5 of 15 slices shown (1 of 3)]
[im 1/15]
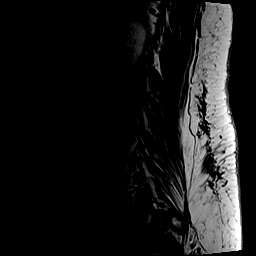
[im 4/15]
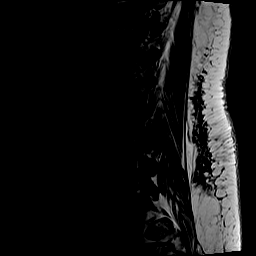
[im 8/15]
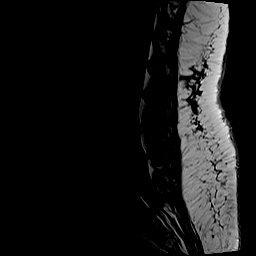
[im 11/15]
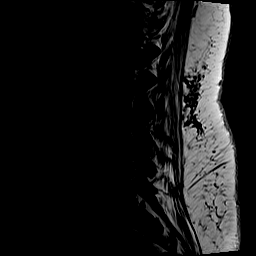
[im 15/15]
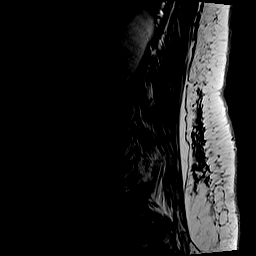

[Series 4: T2 · axial · 4.0mm · 0.78mm/px · z∈[-564,-367]mm · 8 of 34 slices shown (2 of 2)]
[im 1/34]
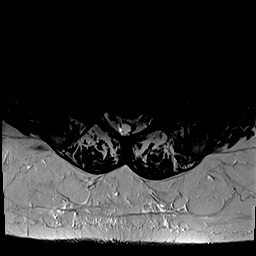
[im 4/34]
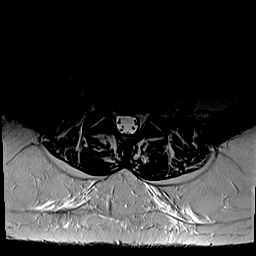
[im 12/34]
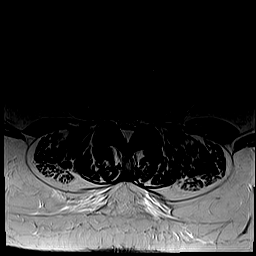
[im 15/34]
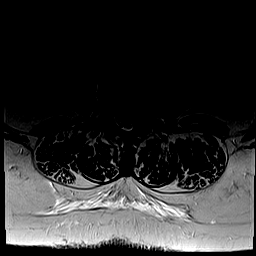
[im 19/34]
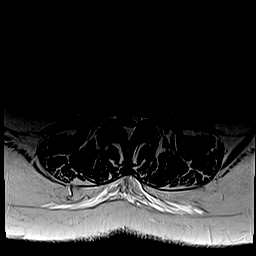
[im 23/34]
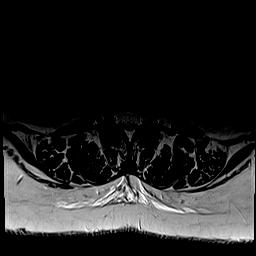
[im 30/34]
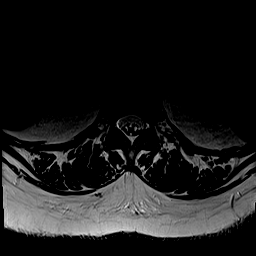
[im 34/34]
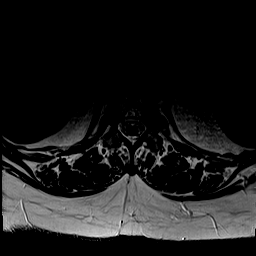

[Series 5: T1 · axial · 4.0mm · 0.39mm/px · z∈[-564,-367]mm · 8 of 34 slices shown (2 of 3)]
[im 1/34]
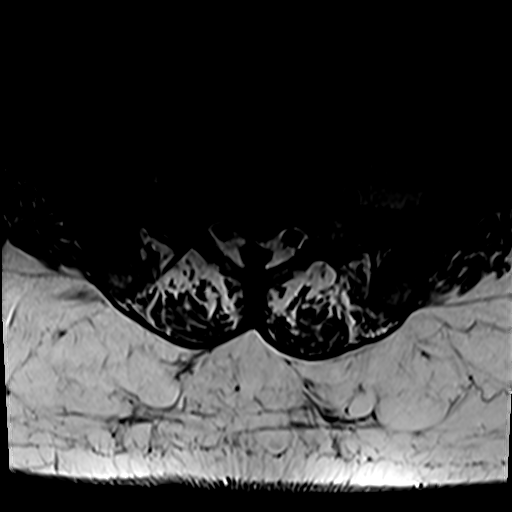
[im 4/34]
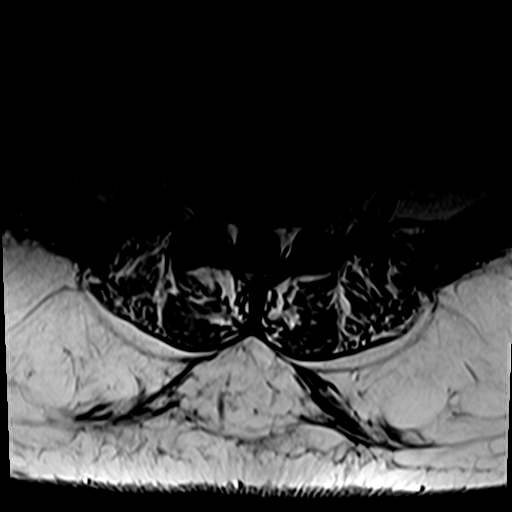
[im 12/34]
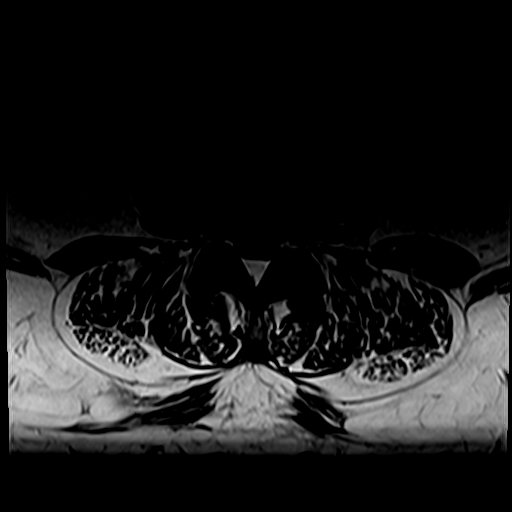
[im 15/34]
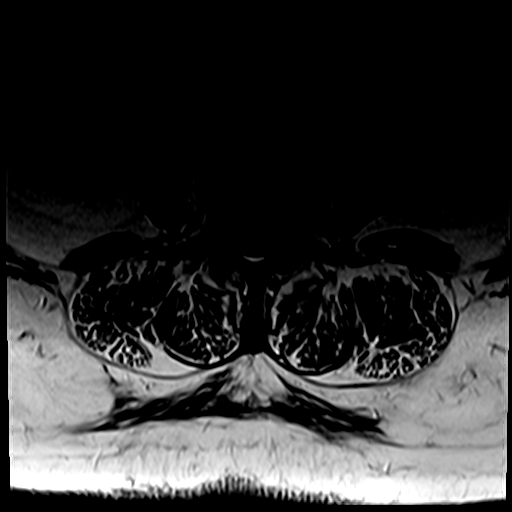
[im 19/34]
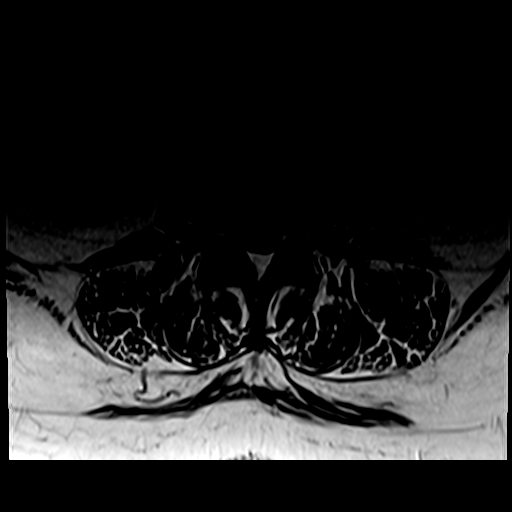
[im 23/34]
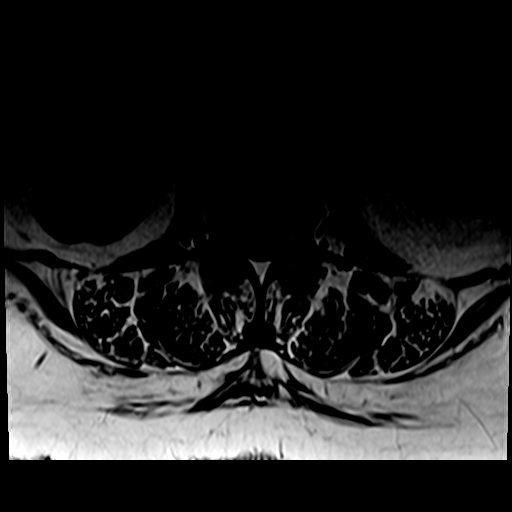
[im 30/34]
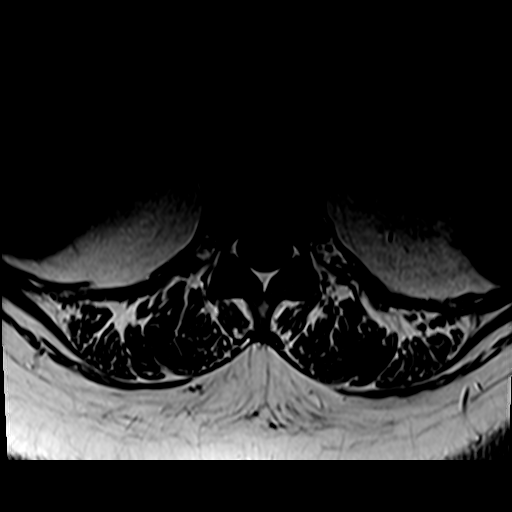
[im 34/34]
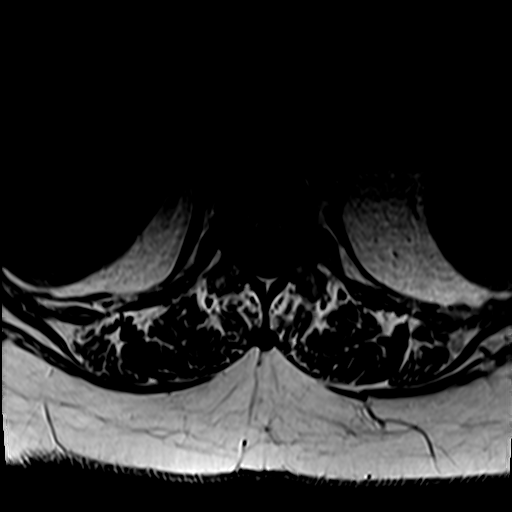

[Series 8: T1 · axial · 4.0mm · 0.39mm/px · z∈[-564,-442]mm · 5 of 34 slices shown (3 of 3)]
[im 1/34]
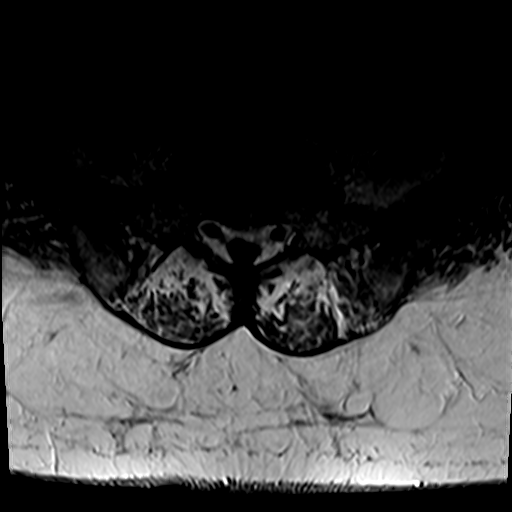
[im 4/34]
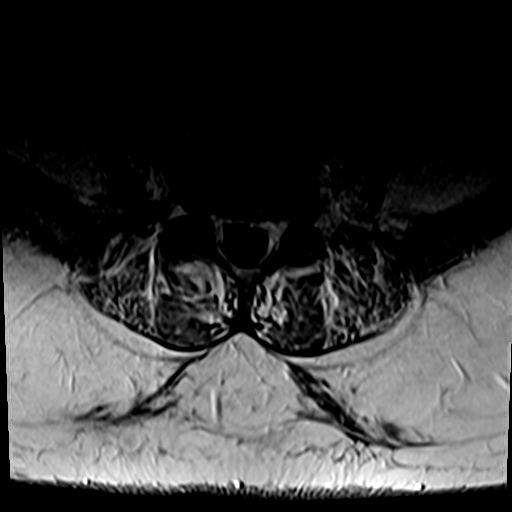
[im 12/34]
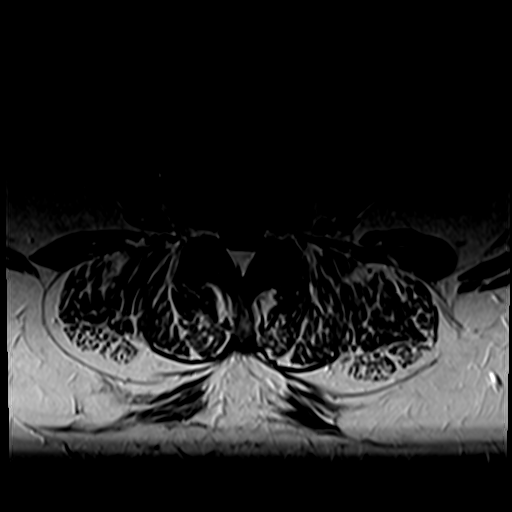
[im 15/34]
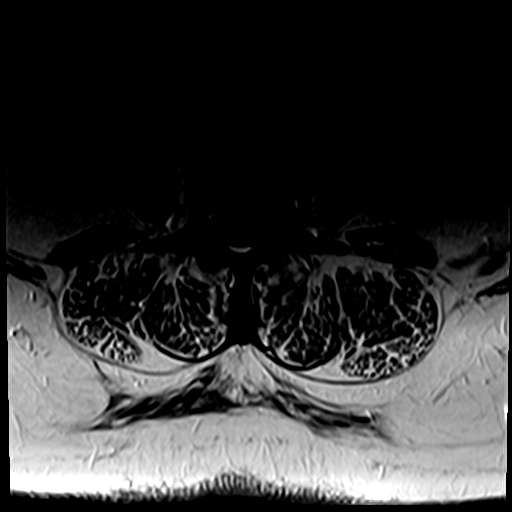
[im 19/34]
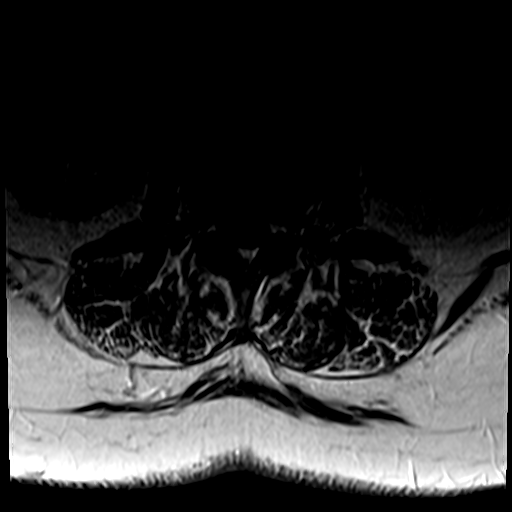

[31 of 48 positions shown; findings below may reference images not displayed]

FINDINGS: Segmentation:  Standard

Alignment:  Normal

Vertebrae:  T12 hemangioma. No fracture or discitis-osteomyelitis.

Conus medullaris and cauda equina: Conus extends to the L1 level.
Conus and cauda equina appear normal.

Paraspinal and other soft tissues: Negative

Disc levels:

There is no abnormal contrast enhancement of the lumbar spine.

T12-L1: Normal disc space and facets. No spinal canal or
neuroforaminal stenosis.

L1-L2: Normal disc space and facets. No spinal canal or
neuroforaminal stenosis.

L2-L3: There is a large left subarticular disc extrusion that
extends inferiorly into the left L3-4 neural foramen. There is mass
effect on the descending left-sided nerve roots. No central spinal
canal or neural foraminal stenosis at this level.

L3-L4: The disc extrusion extending inferiorly from the above level
contacts the exiting left L3 nerve root. There is also mild disc
bulge at this level with mild facet hypertrophy. No central spinal
canal or right neural foraminal stenosis.

L4-L5: Disc space narrowing with mild diffuse bulge. No spinal canal
stenosis. Moderate bilateral neural foraminal stenosis.

L5-S1: Small disc bulge. Mild bilateral foraminal stenosis. No
spinal canal stenosis.

Visualized sacrum: Normal.
IMPRESSION: 1. Large left subarticular disc extrusion at L2-L3 that extends
inferiorly into the left L3-4 neural foramen and displaces the
descending left-sided nerve root. This could contribute to radicular
symptoms in the left L3 or L4 distributions.
2. Moderate bilateral L4-L5 neural foraminal stenosis.
3. No spinal canal stenosis.

## 2020-02-04 MED ORDER — POTASSIUM CHLORIDE 10 MEQ/100ML IV SOLN
10.0000 meq | INTRAVENOUS | Status: AC
  Administered 2020-02-04: 10 meq via INTRAVENOUS
  Filled 2020-02-04 (×5): qty 100

## 2020-02-04 MED ORDER — SERTRALINE HCL 50 MG PO TABS
200.0000 mg | ORAL_TABLET | Freq: Every day | ORAL | Status: DC
  Administered 2020-02-05 – 2020-02-11 (×7): 200 mg via ORAL
  Filled 2020-02-04 (×7): qty 4

## 2020-02-04 MED ORDER — ONDANSETRON HCL 4 MG/2ML IJ SOLN
4.0000 mg | Freq: Four times a day (QID) | INTRAMUSCULAR | Status: DC | PRN
  Administered 2020-02-04 – 2020-02-07 (×3): 4 mg via INTRAVENOUS
  Filled 2020-02-04 (×3): qty 2

## 2020-02-04 MED ORDER — IVERMECTIN 3 MG PO TABS
150.0000 ug/kg | ORAL_TABLET | Freq: Every day | ORAL | Status: DC
  Administered 2020-02-04 – 2020-02-08 (×5): 13500 ug via ORAL
  Filled 2020-02-04 (×9): qty 5

## 2020-02-04 MED ORDER — FUROSEMIDE 10 MG/ML IJ SOLN
40.0000 mg | Freq: Once | INTRAMUSCULAR | Status: DC
  Filled 2020-02-04: qty 4

## 2020-02-04 MED ORDER — POTASSIUM CHLORIDE 10 MEQ/100ML IV SOLN
10.0000 meq | INTRAVENOUS | Status: AC
  Administered 2020-02-04 (×2): 10 meq via INTRAVENOUS
  Filled 2020-02-04 (×2): qty 100

## 2020-02-04 MED ORDER — DIGOXIN 250 MCG PO TABS
0.2500 mg | ORAL_TABLET | Freq: Every day | ORAL | Status: DC
  Administered 2020-02-05 – 2020-02-11 (×6): 0.25 mg via ORAL
  Filled 2020-02-04 (×8): qty 1

## 2020-02-04 MED ORDER — ENSURE ENLIVE PO LIQD
237.0000 mL | Freq: Two times a day (BID) | ORAL | Status: DC
  Administered 2020-02-05 – 2020-02-09 (×7): 237 mL via ORAL

## 2020-02-04 MED ORDER — CHLORHEXIDINE GLUCONATE CLOTH 2 % EX PADS
6.0000 | MEDICATED_PAD | Freq: Every day | CUTANEOUS | Status: DC
  Administered 2020-02-04 – 2020-02-10 (×6): 6 via TOPICAL

## 2020-02-04 MED ORDER — ASPIRIN EC 81 MG PO TBEC
81.0000 mg | DELAYED_RELEASE_TABLET | Freq: Every day | ORAL | Status: DC
  Administered 2020-02-05 – 2020-02-11 (×7): 81 mg via ORAL
  Filled 2020-02-04 (×7): qty 1

## 2020-02-04 MED ORDER — FLUTICASONE FUROATE-VILANTEROL 100-25 MCG/INH IN AEPB
1.0000 | INHALATION_SPRAY | Freq: Every day | RESPIRATORY_TRACT | Status: DC
  Administered 2020-02-04 – 2020-02-11 (×8): 1 via RESPIRATORY_TRACT
  Filled 2020-02-04: qty 28

## 2020-02-04 MED ORDER — UMECLIDINIUM BROMIDE 62.5 MCG/INH IN AEPB
1.0000 | INHALATION_SPRAY | Freq: Every day | RESPIRATORY_TRACT | Status: DC
  Administered 2020-02-04 – 2020-02-11 (×8): 1 via RESPIRATORY_TRACT
  Filled 2020-02-04: qty 7

## 2020-02-04 MED ORDER — FUROSEMIDE 10 MG/ML IJ SOLN
INTRAMUSCULAR | Status: AC
  Administered 2020-02-04: 40 mg
  Filled 2020-02-04: qty 4

## 2020-02-04 NOTE — ED Notes (Signed)
Patient is resting, on bipap at this time

## 2020-02-04 NOTE — Progress Notes (Signed)
Inpatient Diabetes Program Recommendations  AACE/ADA: New Consensus Statement on Inpatient Glycemic Control (2015)  Target Ranges:  Prepandial:   less than 140 mg/dL      Peak postprandial:   less than 180 mg/dL (1-2 hours)      Critically ill patients:  140 - 180 mg/dL   Lab Results  Component Value Date   GLUCAP 135 (H) 02/04/2020    Review of Glycemic Control Results for Theresa [name], Theresa [name] (MRN [id number]) as of 02/04/2020 13:32  Ref. Range 02/03/2020 21:23 02/04/2020 09:46  Glucose-Capillary Latest Ref Range: 70 - 99 mg/dL 175 (H) 135 (H)   COVID Diabetes history: None  Current orders for Inpatient glycemic control:  Novolog 0-15 units tid + hs Novolog 3 units tid meal coverage  Decadron 6 mg Daily  Inpatient Diabetes Program Recommendations:    DM Coordinator consult for DM. Agree with current insulin orders.  Consider ordering an A1c level to determine glucose control over the past 2-3 months.   Pt had solumedrol + decadron doses and glucose trends have remained below 175 mg/dl.   Will continue to follow pt.  Thanks,  Tama Headings RN, MSN, BC-ADM Inpatient Diabetes Coordinator Team Pager [phone] (8a-5p)

## 2020-02-04 NOTE — ED Notes (Signed)
Pt resting in room, IV med complete.  Able to eat banana and drink juice.

## 2020-02-04 NOTE — Progress Notes (Addendum)
Initial Nutrition Assessment  DOCUMENTATION CODES:   Obesity unspecified  INTERVENTION:   Ensure Enlive po BID, each supplement provides 350 kcal and 20 grams of protein  Magic cup TID with meals, each supplement provides 290 kcal and 9 grams of protein  Liberalize diet   Pt likely at moderate refeed risk   NUTRITION DIAGNOSIS:   Increased nutrient needs related to catabolic illness(COVID 19, COIPD, CHF) as evidenced by increased estimated needs.  GOAL:   Patient will meet greater than or equal to 90% of their needs  MONITOR:   PO intake, Supplement acceptance, Labs, Weight trends, Skin, I & O's  REASON FOR ASSESSMENT:   Consult Assessment of nutrition requirement/status  ASSESSMENT:   40 y female with known history of anxiety, CHF, COPD, depression, GERD being admitted for COVID pneumonia.   Unable to reach pt by phone. Pt currently on bipap. RD is familiar with this patient from previous admits. Pt generally with good appetite and oral intake at baseline; pt usually eats well while hospitalized. RD suspects pt with poor appetite and oral intake pta r/t COVID 19. RD will add supplements and vitamins to help pt meet her estimated needs. RD will also liberalize the heart healthy portion of pt's diet as this is restrictive of protein. Per chart, pt's UBW is ~246lbs. Pt appears fairly weight stable at baseline. It appears pt has lost 45lbs(18%) over the past 3 months if pt's admit weight is correct. Last documented weight in chart from an office visit on 4/12 reported pt's weight at 216lbs; this would be severe weight loss.   Medications reviewed and include: vitamin C, aspirin, D3, dexamethasone, lovenox, insulin, protonix, zinc  Labs reviewed: K 3.3(L), P 3.4 wnl, Mg 2.2 wnl Wbc- 10.8(H) cbgs- 175, 135 x 24 hrs  NUTRITION - FOCUSED PHYSICAL EXAM: Unable to assess at this time as pt with COVID 19  Diet Order:   Diet Order            Diet 2 gram sodium Room service  appropriate? Yes; Fluid consistency: Thin  Diet effective now             EDUCATION NEEDS:   Not appropriate for education at this time  Skin:  Skin Assessment: Reviewed RN Assessment  Last BM:  pta  Height:   Ht Readings from Last 1 Encounters:  02/03/20 _0  (1.575 m)    Weight:   Wt Readings from Last 1 Encounters:  02/03/20 91.6 kg    Ideal Body Weight:  50 kg  BMI:  Body mass index is 36.95 kg/m.  Estimated Nutritional Needs:   Kcal:  1900-2200kcal/day  Protein:  95-110g/day  Fluid:  1.5L/day  Koleen Distance MS, RD, LDN Please refer to [facility] for RD and/or RD on-call/weekend/after hours pager

## 2020-02-04 NOTE — Progress Notes (Signed)
Inpatient Diabetes Program Recommendations  AACE/ADA: New Consensus Statement on Inpatient Glycemic Control (2015)  Target Ranges:  Prepandial:   less than 140 mg/dL      Peak postprandial:   less than 180 mg/dL (1-2 hours)      Critically ill patients:  140 - 180 mg/dL   Results for PAM, VANALSTINE (MRN [id number]) as of 02/04/2020 13:31  Ref. Range 02/03/2020 21:23 02/04/2020 09:46  Glucose-Capillary Latest Ref Range: 70 - 99 mg/dL  125 mg Solumedrol given at 3:04pm +  6 mg Decadron given 7:44pm 175 (H) 135 (H)  2 units NOVOLOG     Admit with: Acute hypoxic resp failure due to COVID Pneumonia  No History of Diabetes noted  Current Orders: Novolog Moderate Correction Scale/ SSI (0-15 units) TID AC + HS      Note patient getting Decadron 6 mg Daily.  Novolog SSI started this AM in anticipation or rising CBGs due to steroids and illness.  Will follow and assist.    --Will follow patient during hospitalization--  Wyn Quaker RN, MSN, CDE Diabetes Coordinator Inpatient Glycemic Control Team Team Pager: [phone] (8a-5p)

## 2020-02-04 NOTE — ED Notes (Signed)
Ready bed @ 1209, patient going to room ICU 15

## 2020-02-04 NOTE — Progress Notes (Signed)
   [place] [facility] [place] NAME: Theresa [name]    MR#:  [id number]  DATE OF BIRTH:  June 13, 1956  SUBJECTIVE:  CHIEF COMPLAINT:   Chief Complaint  Patient presents with  . Respiratory Distress    REVIEW OF SYSTEMS:  ROS DRUG ALLERGIES:   Allergies  Allergen Reactions  . Codeine Nausea And Vomiting  . Meloxicam Other (See Comments)    Stomach pain   VITALS:  Blood pressure 112/68, pulse 76, temperature 98.7 F (37.1 C), temperature source Oral, resp. rate 19, height _0  (1.575 m), weight 91.6 kg, SpO2 96 %. PHYSICAL EXAMINATION:  Physical Exam LABORATORY PANEL:  Female CBC Recent Labs  Lab 02/04/20 0438  WBC 10.8*  HGB 12.8  HCT 41.4  PLT 154   ------------------------------------------------------------------------------------------------------------------ Chemistries  Recent Labs  Lab 02/04/20 0438  NA 140  K 3.3*  CL 98  CO2 31  GLUCOSE 140*  BUN 12  CREATININE 0.54  CALCIUM 8.3*  MG 2.2  AST 26  ALT 20  ALKPHOS 98  BILITOT 0.7   RADIOLOGY:  No results found. ASSESSMENT AND PLAN:  50 y female with known history of anxiety, CHF, COPD, depression, GERDbeing admitted for COVID pneumonia.  Acute hypoxic resp failure - Present on admission, Due to COVID pneumonia. required BiPAP initially Weaned off to 15 liters O2 - maintains sats in 88% - high risk for decompensation, will monitor in SDU for now - appreciate PCCM assistance - Continue decadron, remdesivir (day 2/5), and vitamins  Trend inflammatory marker daily Follow cultures - consider actemera if appropriate based on inflammatory markers and PCCM eval  Hypokalemia Replete & recheck, check Mg  Acute on chronic diastolic CHF - Last echo showed normal EF - given one dose of lasix per PCCM - seems well compensated at this time  Hyperglycemia No h/o DM, likely due to steroids, SSI for now HbA1c 6.4   Status is: Inpatient  Remains inpatient appropriate  because:Inpatient level of care appropriate due to severity of illness   Dispo: The patient is from: Home              Anticipated d/c is to: Home              Anticipated d/c date is: > 3 days              Patient currently is not medically stable to d/c.   DVT prophylaxis: Lovenox Family Communication: discussed with patient   All the records are reviewed and case discussed with Care Management/Social Worker. Management plans discussed with the patient, nursing, PCCM and they are in agreement.  CODE STATUS: Full Code  TOTAL TIME TAKING CARE OF THIS PATIENT: 35 minutes.   More than 50% of the time was spent in counseling/coordination of care: YES  POSSIBLE D/C IN 3-4 DAYS, DEPENDING ON CLINICAL CONDITION.   Max Sane M.D on 02/04/2020 at 5:45 PM  [facility]   CC: Primary care physician; Margo Common, PA  Note: This dictation was prepared with Dragon dictation along with smaller phrase technology. Any transcriptional errors that result from this process are unintentional.

## 2020-02-05 DIAGNOSIS — J9601 Acute respiratory failure with hypoxia: Secondary | ICD-10-CM | POA: Diagnosis not present

## 2020-02-05 DIAGNOSIS — U071 COVID-19: Secondary | ICD-10-CM | POA: Diagnosis not present

## 2020-02-05 DIAGNOSIS — J1282 Pneumonia due to Coronavirus disease 2019: Secondary | ICD-10-CM | POA: Diagnosis not present

## 2020-02-05 LAB — COMPREHENSIVE METABOLIC PANEL
ALT: 17 U/L (ref 0–44)
AST: 19 U/L (ref 15–41)
Albumin: 2.5 g/dL — ABNORMAL LOW (ref 3.5–5.0)
Alkaline Phosphatase: 94 U/L (ref 38–126)
Anion gap: 11 (ref 5–15)
BUN: 15 mg/dL (ref 8–23)
CO2: 36 mmol/L — ABNORMAL HIGH (ref 22–32)
Calcium: 8.7 mg/dL — ABNORMAL LOW (ref 8.9–10.3)
Chloride: 96 mmol/L — ABNORMAL LOW (ref 98–111)
Creatinine, Ser: 0.53 mg/dL (ref 0.44–1.00)
GFR calc Af Amer: >60 mL/min (ref >60)
GFR calc non Af Amer: >60 mL/min (ref >60)
Glucose, Bld: 101 mg/dL — ABNORMAL HIGH (ref 70–99)
Potassium: 3.2 mmol/L — ABNORMAL LOW (ref 3.5–5.1)
Sodium: 143 mmol/L (ref 135–145)
Total Bilirubin: 0.6 mg/dL (ref 0.3–1.2)
Total Protein: 6.1 g/dL — ABNORMAL LOW (ref 6.5–8.1)

## 2020-02-05 LAB — GLUCOSE, CAPILLARY
Glucose-Capillary: 82 mg/dL (ref 70–99)
Glucose-Capillary: 101 mg/dL — ABNORMAL HIGH (ref 70–99)
Glucose-Capillary: 128 mg/dL — ABNORMAL HIGH (ref 70–99)
Glucose-Capillary: 148 mg/dL — ABNORMAL HIGH (ref 70–99)

## 2020-02-05 LAB — CBC WITH DIFFERENTIAL/PLATELET
Abs Immature Granulocytes: 0.11 10*3/uL — ABNORMAL HIGH (ref 0.00–0.07)
Basophils Absolute: 0 10*3/uL (ref 0.0–0.1)
Basophils Relative: 0 %
Eosinophils Absolute: 0 10*3/uL (ref 0.0–0.5)
Eosinophils Relative: 0 %
HCT: 42.2 % (ref 36.0–46.0)
Hemoglobin: 12.2 g/dL (ref 12.0–15.0)
Immature Granulocytes: 1 %
Lymphocytes Relative: 10 %
Lymphs Abs: 1.4 10*3/uL (ref 0.7–4.0)
MCH: 26.9 pg (ref 26.0–34.0)
MCHC: 28.9 g/dL — ABNORMAL LOW (ref 30.0–36.0)
MCV: 93.2 fL (ref 80.0–100.0)
Monocytes Absolute: 0.8 10*3/uL (ref 0.1–1.0)
Monocytes Relative: 6 %
Neutro Abs: 11.2 10*3/uL — ABNORMAL HIGH (ref 1.7–7.7)
Neutrophils Relative %: 83 %
Platelets: 168 10*3/uL (ref 150–400)
RBC: 4.53 MIL/uL (ref 3.87–5.11)
RDW: 19.4 % — ABNORMAL HIGH (ref 11.5–15.5)
Smear Review: NORMAL
WBC: 13.6 10*3/uL — ABNORMAL HIGH (ref 4.0–10.5)
nRBC: 0 % (ref 0.0–0.2)

## 2020-02-05 LAB — MAGNESIUM: Magnesium: 2.6 mg/dL — ABNORMAL HIGH (ref 1.7–2.4)

## 2020-02-05 LAB — PHOSPHORUS: Phosphorus: 3.5 mg/dL (ref 2.5–4.6)

## 2020-02-05 LAB — FERRITIN: Ferritin: 148 ng/mL (ref 11–307)

## 2020-02-05 LAB — C-REACTIVE PROTEIN: CRP: 10.7 mg/dL — ABNORMAL HIGH (ref <1.0)

## 2020-02-05 LAB — FIBRIN DERIVATIVES D-DIMER (ARMC ONLY): Fibrin derivatives D-dimer (ARMC): 1917.44 ng/mL (FEU) — ABNORMAL HIGH (ref 0.00–499.00)

## 2020-02-05 MED ORDER — HYDROCODONE-ACETAMINOPHEN 5-325 MG PO TABS
1.0000 | ORAL_TABLET | Freq: Three times a day (TID) | ORAL | Status: DC | PRN
  Administered 2020-02-05 – 2020-02-09 (×10): 1 via ORAL
  Filled 2020-02-05 (×11): qty 1

## 2020-02-05 MED ORDER — TORSEMIDE 20 MG PO TABS
40.0000 mg | ORAL_TABLET | Freq: Two times a day (BID) | ORAL | Status: DC
  Administered 2020-02-05 – 2020-02-11 (×12): 40 mg via ORAL
  Filled 2020-02-05 (×13): qty 2

## 2020-02-05 MED ORDER — POTASSIUM CHLORIDE CRYS ER 20 MEQ PO TBCR
30.0000 meq | EXTENDED_RELEASE_TABLET | ORAL | Status: AC
  Administered 2020-02-05 (×2): 30 meq via ORAL
  Filled 2020-02-05 (×2): qty 2

## 2020-02-05 MED ORDER — SERTRALINE HCL 50 MG PO TABS: 200.0000 mg | ORAL_TABLET | Freq: Every day | ORAL | Status: DC

## 2020-02-05 MED ORDER — ASPIRIN EC 81 MG PO TBEC: 81.0000 mg | DELAYED_RELEASE_TABLET | Freq: Every day | ORAL | Status: DC

## 2020-02-05 MED ORDER — DIGOXIN 250 MCG PO TABS: 0.2500 mg | ORAL_TABLET | Freq: Every day | ORAL | Status: DC

## 2020-02-05 MED ORDER — OXYCODONE HCL 5 MG PO TABS: 5.0000 mg | ORAL_TABLET | Freq: Four times a day (QID) | ORAL | Status: DC | PRN

## 2020-02-05 NOTE — Progress Notes (Signed)
Pt with sustained oxygen saturations in mid 80s. Bipap changed to 55%, okayed with RT.

## 2020-02-05 NOTE — Consult Note (Signed)
Name: Theresa [name] MRN: [id number] DOB: 03-24-1956    ADMISSION DATE:  02/03/2020 CONSULTATION DATE: 02/03/2020  REFERRING MD : Dr. Manuella Ghazi   CHIEF COMPLAINT: Shortness of Breath   BRIEF PATIENT DESCRIPTION: 64 yo female admitted with acute on chronic hypoxic respiratory failure COVID-19 pneumonia and AECOPD requiring Bipap   SIGNIFICANT EVENTS/STUDIES:  04/19: Pt admitted with acute on chronic hypoxic respiratory failure requiring Bipap   HISTORY OF PRESENT ILLNESS:  This is a 64 yo female with a PMH of Tobacco Abuse, Obesity, GERD, DDD, COPD (Bipap qhs), CHF, and Anxiety. She presented to [facility] ER on 04/19 with c/o shortness of breath, cough, nausea, vomiting, and diarrhea onset of symptoms a few days prior to presentation although n/v/d has since resolved.  In the ER CXR concerning for multifocal pneumonia and/or possible pulmonary edema.  Lab results revealed K+ 2.8, glucose 143, BNP 190, LDH 361, troponin 25, d-dimer 3,739.85, fibrinogen 707, and wbc 16.8.  Influenza PCR negative, however COVID-19 positive.  Pt received 40 mg iv lasix, 125 mg iv solumedrol x1 dose, duonebs x3, remdesivir, vancomycin, and zosyn.  Due to continued tachypnea and hypoxia pt placed on Bipap.  She was subsequently admitted to the stepdown unit per hospitalist team PCCM consulted to assist with management.   PAST MEDICAL HISTORY :   has a past medical history of Acute postoperative pain (10/03/2017), Anxiety, BiPAP (biphasic positive airway pressure) dependence, Carpal tunnel syndrome, CHF (congestive heart failure) ([place]), CHF (congestive heart failure) ([place]), Chronic generalized abdominal pain, Chronic rhinitis, COPD (chronic obstructive pulmonary disease) ([place]), DDD (degenerative disc disease), cervical, DDD (degenerative disc disease), lumbosacral, Depression, Dyspnea, Edema, Flu, Gastritis, GERD (gastroesophageal reflux disease), Hematuria, Hernia of abdominal wall (07/17/2018), Hernia, abdominal, Hypertension,  Kidney stones, Low back pain, Lumbar radiculitis, Malodorous urine, Muscle weakness, Obesity, Oxygen dependent, Renal cyst, Sensory urge incontinence, Thyroid activity decreased, Tobacco abuse, and Wheezing.  has a past surgical history that includes Abdominal hysterectomy; Tonsillectomy; Carpal tunnel release (Left, 2012); Tubal ligation; epigastric hernia repair (N/A, 08/13/2018); Umbilical hernia repair (N/A, 08/13/2018); and RIGHT/LEFT HEART CATH AND CORONARY ANGIOGRAPHY (N/A, 07/11/2019). Prior to Admission medications   Medication Sig Start Date End Date Taking? Authorizing Provider  albuterol (VENTOLIN HFA) 108 (90 Base) MCG/ACT inhaler Inhale 2 puffs into the lungs every 6 (six) hours as needed for wheezing or shortness of breath. 10/23/19   Rudene Re, MD  aspirin EC 81 MG tablet Take 81 mg by mouth daily.    [provider]  baclofen (LIORESAL) 10 MG tablet Take 1 tablet (10 mg total) by mouth 3 (three) times daily. 01/13/20 07/11/20  Milinda Pointer, MD  bisoprolol-hydrochlorothiazide [facility]) 10-6.25 MG tablet TAKE 1 TABLET BY MOUTH DAILY Patient not taking: Reported on 01/22/2020 06/27/19   Chrismon, Vickki Muff, PA  Cholecalciferol ([employer] D3) 2000 units CAPS Take 2,000 Units by mouth daily.     [provider]  digoxin (LANOXIN) 0.25 MG tablet TAKE 1 TABLET(0.25 MG) BY MOUTH DAILY 11/17/19   Chrismon, Vickki Muff, PA  Fluticasone-Umeclidin-Vilant 100-62.5-25 MCG/INH AEPB Inhale 1 puff into the lungs daily. 10/03/18   [provider]  gabapentin (NEURONTIN) 400 MG capsule Take 1 capsule (400 mg total) by mouth 3 (three) times daily. 01/13/20 07/11/20  Milinda Pointer, MD  HYDROcodone-acetaminophen (NORCO/VICODIN) 5-325 MG tablet Take 1 tablet by mouth every 8 (eight) hours as needed for severe pain. Must last 30 days 01/13/20 02/12/20  Milinda Pointer, MD  HYDROcodone-acetaminophen (NORCO/VICODIN) 5-325 MG tablet Take 1 tablet by mouth every 8 (  eight) hours as needed  for severe pain. Must last 30 days Patient not taking: Reported on 01/22/2020 02/12/20 03/13/20  Milinda Pointer, MD  HYDROcodone-acetaminophen (NORCO/VICODIN) 5-325 MG tablet Take 1 tablet by mouth every 8 (eight) hours as needed for severe pain. Must last 30 days Patient not taking: Reported on 01/22/2020 03/13/20 04/12/20  Milinda Pointer, MD  ipratropium-albuterol (DUONEB) 0.5-2.5 (3) MG/3ML SOLN USE 1 VIAL VIA NEBULIZER EVERY 6 HOURS AS NEEDED FOR SHORTNESS OF BREATH OR WHEEZING 10/14/19   Chrismon, Vickki Muff, PA  levothyroxine (SYNTHROID) 25 MCG tablet TAKE 1 TABLET(25 MCG) BY MOUTH DAILY BEFORE BREAKFAST Patient not taking: Reported on 01/22/2020 12/27/19   Chrismon, Vickki Muff, PA  potassium chloride SA (KLOR-CON) 20 MEQ tablet TAKE 1 TABLET(20 MEQ) BY MOUTH TWICE DAILY 09/15/19   Darylene Price A, FNP  sertraline (ZOLOFT) 100 MG tablet TAKE 2 TABLETS BY MOUTH EVERY MORNING 11/24/19   Chrismon, Vickki Muff, PA  torsemide (DEMADEX) 20 MG tablet TAKE 2 TABLETS(40 MG) BY MOUTH TWICE DAILY Patient taking differently: Take 40 mg by mouth 2 (two) times daily.  11/24/19   [name], FNP   Allergies  Allergen Reactions  . Codeine Nausea And Vomiting  . Meloxicam Other (See Comments)    Stomach pain    FAMILY HISTORY:  family history includes Alcohol abuse in her father; Breast cancer in her sister; Cancer in her brother, brother, and sister; Coronary artery disease in her mother; Heart disease in her father and mother; Lung cancer in her sister; Pneumonia in her brother; Stroke in her mother. SOCIAL HISTORY:  reports that she has been smoking cigarettes. She has a 22.00 pack-year smoking history. She has never used smokeless tobacco. She reports that she does not drink alcohol or use drugs.  REVIEW OF SYSTEMS: Positives in BOLD  Constitutional: Negative for fever, chills, weight loss, malaise/fatigue and diaphoresis.  HENT: Negative for hearing loss, ear pain, nosebleeds, congestion, sore throat,  neck pain, tinnitus and ear discharge.   Eyes: Negative for blurred vision, double vision, photophobia, pain, discharge and redness.  Respiratory: cough, hemoptysis, sputum production, shortness of breath, wheezing and stridor.   Cardiovascular: Negative for chest pain, palpitations, orthopnea, claudication, leg swelling and PND.  Gastrointestinal: Negative for heartburn, nausea, vomiting, abdominal pain, diarrhea, constipation, blood in stool and melena.  Genitourinary: Negative for dysuria, urgency, frequency, hematuria and flank pain.  Musculoskeletal: left flank pain, myalgias, back pain, joint pain and falls.  Skin: Negative for itching and rash.  Neurological: Negative for dizziness, tingling, tremors, sensory change, speech change, focal weakness, seizures, loss of consciousness, weakness and headaches.  Endo/Heme/Allergies: Negative for environmental allergies and polydipsia. Does not bruise/bleed easily.  SUBJECTIVE:  No complaints at this time states "I am hungry"  VITAL SIGNS: Temp:  [98.1 F (36.7 C)-98.6 F (37 C)] 98.6 F (37 C) (04/21 1800) Pulse Rate:  [72-91] 74 (04/21 1800) Resp:  [16-24] 18 (04/21 1800) BP: (100-132)/(54-78) 118/78 (04/21 1800) SpO2:  [90 %-97 %] 92 % (04/21 1800) FiO2 (%):  [55 %] 55 % (04/21 0200) Weight:  [91.5 kg] 91.5 kg (04/21 0500)  PHYSICAL EXAMINATION: General: acutely ill appearing female, NAD on Bipap  Neuro: alert and oriented, follows commands, PERRL  HEENT: supple, no JVD  Cardiovascular: nsr, no R/G  Lungs: crackles and inspiratory wheezes throughout, even, slightly tachypneic  Abdomen: +BS x4, obese, soft, non distended, non tender Musculoskeletal: normal bulk and tone, no edema  Skin: intact no rashes or lesions present   Recent  Labs  Lab 02/03/20 1423 02/03/20 1423 02/03/20 2148 02/04/20 0438 02/05/20 0354  NA 138  --   --  140 143  K 2.8*   < > 2.8* 3.3* 3.2*  CL 95*  --   --  98 96*  CO2 33*  --   --  31 36*  BUN  10  --   --  12 15  CREATININE 0.54  0.64  --   --  0.54 0.53  GLUCOSE 143*  --   --  140* 101*   < > = values in this interval not displayed.   Recent Labs  Lab 02/03/20 1423 02/04/20 0438 02/05/20 0354  HGB 13.6  13.8 12.8 12.2  HCT 45.4  45.4 41.4 42.2  WBC 18.1*  16.9* 10.8* 13.6*  PLT 164  172 154 168   No results found.  ASSESSMENT / PLAN:  Acute on chronic hypoxic respiratory failure secondary to COVID-19 negative, mild pulmonary edema, and AECOPD  Hx: Current everyday smoker and Bipap qhs  Prn Bipap or supplemental O2 for dyspnea and/or hypoxia  Scheduled and prn bronchodilator therapy Continue decadron, remdesivir, and vitamins  Trend inflammatory marker and PCT  Follow cultures If PCT positive will continue abx therapy  Pulmonary hygiene and OOB to chair once respiratory status stabilized  IV lasix x1 dose: trend BMP, replace electrolytes as indicated, monitor UOP Smoking cessation counseling provided Maintain airborne and contact precautions   Best Practice: VTE px: subq lovenox SUP px: iv protonix  Diet: heart healthy diet   Critical care provider statement:    Critical care time (minutes):  33   Critical care time was exclusive of:  Separately billable procedures and  treating other patients   Critical care was necessary to treat or prevent imminent or  life-threatening deterioration of the following conditions:  acute hypoxemic respiratory failure due to [place] care was time spent personally by me on the following  activities:  Development of treatment plan with patient or surrogate,  discussions with consultants, evaluation of patient's response to  treatment, examination of patient, obtaining history from patient or  surrogate, ordering and performing treatments and interventions, ordering  and review of laboratory studies and re-evaluation of patient's condition   I assumed direction of critical care for this patient from another   provider in my specialty: no

## 2020-02-05 NOTE — Progress Notes (Signed)
PROGRESS NOTE    Theresa [name]  ZOX:[id number] DOB: 11-20-1955 DOA: 02/03/2020 PCP: Margo Common, PA   Brief Narrative:  Theresa [name]  is a 64 y.o. female with a known history of anxiety, CHF, COPD, depression, GERDbeing admitted for COVID pneumonia. She came in for shortness of breath worsening over the last week with productive cough. Patient denies any fevers, states she usually wears 2-1/2 L of oxygen at baseline. She presents with hypoxia and tachypnea. O2 sats in 80s requiring BiPAP. covid is +.  4/21: Seen and examined.  Remains in stepdown status.  Weaned to high flow nasal cannula.  Currently on 15 L high flow.  Mentating well.  No fevers over interval.   Assessment & Plan:   Active Problems:   Pneumonia due to COVID-19 virus Acute hypoxic resp failure - Present on admission, Due to COVID pneumonia. required BiPAP initially Weaned off to 15 liters O2 - maintains sats in 88% Plan: - high risk for decompensation, will monitor in SDU for now - appreciate PCCM assistance - Continue decadron, remdesivir (day 3/5), and vitamins  - Continue ivermectin per PCCM Trend inflammatory marker daily Follow cultures   Hypokalemia Replete & recheck, check Mg  Acute on chronic diastolic CHF - Last echo showed normal EF - given one dose of lasix per PCCM - seems well compensated at this time  Hyperglycemia No h/o DM, likely due to steroids, SSI for now HbA1c 6.4     DVT prophylaxis: Lovenox Code Status: Full Family Communication: None today Disposition Plan:Status is: Inpatient  Remains inpatient appropriate because:Inpatient level of care appropriate due to severity of illness   Dispo: The patient is from: Home              Anticipated d/c is to: SNF              Anticipated d/c date is: > 3 days              Patient currently is not medically stable to d/c.  Persistent severe hypoxic respiratory failure in setting of COVID 19       Consultants:     ICU  Procedures:   BPAP  Antimicrobials:   remdesivir   Subjective: Seen and examined Reports some improvement Weaned from BPAP On HFNC at 15L  Objective: Vitals:   02/05/20 0823 02/05/20 1000 02/05/20 1200 02/05/20 1400  BP:  122/68 125/74 122/78  Pulse: 72 74 72 74  Resp: _0 Temp:  98.4 F (36.9 C) 98.1 F (36.7 C) 98.3 F (36.8 C)  TempSrc:  Oral Oral Oral  SpO2: 91% 92% 92% 92%  Weight:      Height:        Intake/Output Summary (Last 24 hours) at 02/05/2020 1720 Last data filed at 02/05/2020 0649 Gross per 24 hour  Intake 97.64 ml  Output 800 ml  Net -702.36 ml   Filed Weights   02/03/20 1414 02/05/20 0500  Weight: 91.6 kg 91.5 kg    Examination:  General exam: Appears calm and comfortable  Respiratory system: Scattered bl rhonchi, normal WOB Cardiovascular system: S1 & S2 heard, RRR. No JVD, murmurs, rubs, gallops or clicks. No pedal edema. Gastrointestinal system: Abdomen is nondistended, soft and nontender. No organomegaly or masses felt. Normal bowel sounds heard. Central nervous system: Alert and oriented. No focal neurological deficits. Extremities: Symmetric 5 x 5 power. Skin: No rashes, lesions or ulcers Psychiatry: Judgement and insight appear normal. Mood & affect appropriate.  Data Reviewed: I have personally reviewed following labs and imaging studies  CBC: Recent Labs  Lab 02/03/20 1423 02/04/20 0438 02/05/20 0354  WBC 18.1*  16.9* 10.8* 13.6*  NEUTROABS  --  9.5* 11.2*  HGB 13.6  13.8 12.8 12.2  HCT 45.4  45.4 41.4 42.2  MCV 91.2  89.9 89.0 93.2  PLT 164  172 154 921   Basic Metabolic Panel: Recent Labs  Lab 02/03/20 1423 02/03/20 2148 02/04/20 0438 02/05/20 0354  NA 138  --  140 143  K 2.8* 2.8* 3.3* 3.2*  CL 95*  --  98 96*  CO2 33*  --  31 36*  GLUCOSE 143*  --  140* 101*  BUN 10  --  12 15  CREATININE 0.54  0.64  --  0.54 0.53  CALCIUM 8.9  --  8.3* 8.7*  MG  --  2.0 2.2 2.6*  PHOS  --   3.1 3.4 3.5   GFR: Estimated Creatinine Clearance: 76.8 mL/min (by C-G formula based on SCr of 0.53 mg/dL). Liver Function Tests: Recent Labs  Lab 02/04/20 0438 02/05/20 0354  AST 26 19  ALT 20 17  ALKPHOS 98 94  BILITOT 0.7 0.6  PROT 6.7 6.1*  ALBUMIN 2.6* 2.5*   No results for input(s): LIPASE, AMYLASE in the last 168 hours. No results for input(s): AMMONIA in the last 168 hours. Coagulation Profile: No results for input(s): INR, PROTIME in the last 168 hours. Cardiac Enzymes: No results for input(s): CKTOTAL, CKMB, CKMBINDEX, TROPONINI in the last 168 hours. BNP (last 3 results) No results for input(s): PROBNP in the last 8760 hours. HbA1C: Recent Labs    02/04/20 0438  HGBA1C 6.4*   CBG: Recent Labs  Lab 02/04/20 1323 02/04/20 1639 02/04/20 2135 02/05/20 0747 02/05/20 1117  GLUCAP 112* 132* 122* 82 101*   Lipid Profile: No results for input(s): CHOL, HDL, LDLCALC, TRIG, CHOLHDL, LDLDIRECT in the last 72 hours. Thyroid Function Tests: No results for input(s): TSH, T4TOTAL, FREET4, T3FREE, THYROIDAB in the last 72 hours. Anemia Panel: Recent Labs    02/04/20 0438 02/05/20 0354  FERRITIN 183 148   Sepsis Labs: Recent Labs  Lab 02/03/20 1423 02/03/20 1716  PROCALCITON <0.10  --   LATICACIDVEN  --  1.4    Recent Results (from the past 240 hour(s))  Respiratory Panel by RT PCR (Flu A&B, Covid) - Nasopharyngeal Swab     Status: Abnormal   Collection Time: 02/03/20  3:06 PM   Specimen: Nasopharyngeal Swab  Result Value Ref Range Status   SARS Coronavirus 2 by RT PCR POSITIVE (A) NEGATIVE Final    Comment: RESULT CALLED TO, READ BACK BY AND VERIFIED WITH: KIM MAIN RN AT 1700 ON 02/03/20 SNG (NOTE) SARS-CoV-2 target nucleic acids are DETECTED. SARS-CoV-2 RNA is generally detectable in upper respiratory specimens  during the acute phase of infection. Positive results are indicative of the presence of the identified virus, but do not rule out bacterial  infection or co-infection with other pathogens not detected by the test. Clinical correlation with patient history and other diagnostic information is necessary to determine patient infection status. The expected result is Negative. Fact Sheet for Patients:  PinkCheek.be Fact Sheet for Healthcare Providers: GravelBags.it This test is not yet approved or cleared by the [place] FDA and  has been authorized for detection and/or diagnosis of SARS-CoV-2 by FDA under an Emergency Use Authorization (EUA).  This EUA will remain in effect (meaning this test can be used)  fo r the duration of  the COVID-19 declaration under Section 564(b)(1) of the Act, 21 U.S.C. section 360bbb-3(b)(1), unless the authorization is terminated or revoked sooner.    Influenza A by PCR NEGATIVE NEGATIVE Final   Influenza B by PCR NEGATIVE NEGATIVE Final    Comment: (NOTE) The Xpert Xpress SARS-CoV-2/FLU/RSV assay is intended as an aid in  the diagnosis of influenza from Nasopharyngeal swab specimens and  should not be used as a sole basis for treatment. Nasal washings and  aspirates are unacceptable for Xpert Xpress SARS-CoV-2/FLU/RSV  testing. Fact Sheet for Patients: PinkCheek.be Fact Sheet for Healthcare Providers: GravelBags.it This test is not yet approved or cleared by the [place] FDA and  has been authorized for detection and/or diagnosis of SARS-CoV-2 by  FDA under an Emergency Use Authorization (EUA). This EUA will remain  in effect (meaning this test can be used) for the duration of the  Covid-19 declaration under Section 564(b)(1) of the Act, 21  U.S.C. section 360bbb-3(b)(1), unless the authorization is  terminated or revoked. Performed at [facility], [address] [place], [place] [address]   Blood culture (routine x 2)     Status: None (Preliminary result)    Collection Time: 02/03/20  5:58 PM   Specimen: BLOOD  Result Value Ref Range Status   Specimen Description BLOOD RAC  Final   Special Requests   Final    BOTTLES DRAWN AEROBIC AND ANAEROBIC Blood Culture adequate volume   Culture   Final    NO GROWTH 2 DAYS Performed at [facility], [address], [place], [place] [address]    Report Status PENDING  Incomplete  Blood culture (routine x 2)     Status: None (Preliminary result)   Collection Time: 02/03/20  5:59 PM   Specimen: BLOOD  Result Value Ref Range Status   Specimen Description BLOOD RH  Final   Special Requests   Final    BOTTLES DRAWN AEROBIC AND ANAEROBIC Blood Culture adequate volume   Culture   Final    NO GROWTH 2 DAYS Performed at [facility], [address] [place], [place] [address]    Report Status PENDING  Incomplete  MRSA PCR Screening     Status: None   Collection Time: 02/04/20  1:34 PM   Specimen: Nasopharyngeal  Result Value Ref Range Status   MRSA by PCR NEGATIVE NEGATIVE Final    Comment:        The GeneXpert MRSA Assay (FDA approved for NASAL specimens only), is one component of a comprehensive MRSA colonization surveillance program. It is not intended to diagnose MRSA infection nor to guide or monitor treatment for MRSA infections. Performed at [facility], [address] [place], [place] [address]          Radiology Studies: No results found.      Scheduled Meds: . albuterol  2 puff Inhalation Q6H  . vitamin C  250 mg Oral Daily  . aspirin EC  81 mg Oral Daily  . baclofen  10 mg Oral TID  . Chlorhexidine Gluconate Cloth  6 each Topical Daily  . cholecalciferol  2,000 Units Oral Daily  . dexamethasone (DECADRON) injection  6 mg Intravenous Daily  . digoxin  0.25 mg Oral Daily  . enoxaparin (LOVENOX) injection  40 mg Subcutaneous Q24H  . feeding supplement (ENSURE ENLIVE)  237 mL Oral BID BM  . fluticasone furoate-vilanterol  1 puff Inhalation  Daily   And  .  umeclidinium bromide  1 puff Inhalation Daily  . furosemide  40 mg Intravenous Once  . gabapentin  400 mg Oral TID  . insulin aspart  0-15 Units Subcutaneous TID WC  . insulin aspart  0-5 Units Subcutaneous QHS  . insulin aspart  3 Units Subcutaneous TID WC  . ivermectin  150 mcg/kg Oral Daily  . pantoprazole (PROTONIX) IV  40 mg Intravenous QHS  . sertraline  200 mg Oral Daily  . torsemide  40 mg Oral BID  . zinc sulfate  220 mg Oral Daily   Continuous Infusions: . remdesivir 100 mg in NS 100 mL 100 mg (02/05/20 1034)     LOS: 2 days    Time spent: 35 min    Sidney Ace, MD [facility] Pager [phone]  If 7PM-7AM, please contact night-coverage 02/05/2020, 5:20 PM

## 2020-02-05 NOTE — TOC Initial Note (Signed)
Transition of Care [facility]) - Initial/Assessment Note    Patient Details  Name: Theresa [name] MRN: [id number] Date of Birth: 10-18-1955  Transition of Care [facility]) CM/SW Contact:    Magnus Ivan, LCSW Phone Number: 02/05/2020, 9:56 AM  Clinical Narrative:             Patient is on airborne precautions. CSW called patient's spouse, [name], to complete Readmission Screening. [name] reported patient lives with him and that he provides transportation. PCP is [employer] [facility], [place]. [facility] [place]. [name] said [facility] due to issues with the pharmacy they were previously using, denied current issues. Patient has a nebulizer and oxygen at home. [name] was unsure which company provides patient's oxygen, per chart review it looks like [employer] may be providing this. [name] reported patient has used Home Health in the past, he could not recall which agency was used. He reported patient has never been to a SNF and that this is not an option for her, stating he will make sure her needs are met at home. CSW will continue to follow.       Expected Discharge Plan: Home/Self Care Barriers to Discharge: Continued Medical Work up   Patient Goals and CMS Choice        Expected Discharge Plan and Services Expected Discharge Plan: Home/Self Care       Living arrangements for the past 2 months: Single Family Home                                      Prior Living Arrangements/Services Living arrangements for the past 2 months: Single Family Home Lives with:: Spouse Patient language and need for interpreter reviewed:: Yes Do you feel safe going back to the place where you live?: Yes      Need for Family Participation in Patient Care: Yes (Comment) Care giver support system in place?: Yes (comment) Current home services: DME Criminal Activity/Legal Involvement Pertinent to Current Situation/Hospitalization: No - Comment as needed  Activities of  Daily Living Home Assistive Devices/Equipment: Oxygen ADL Screening (condition at time of admission) Patient's cognitive ability adequate to safely complete daily activities?: No Is the patient deaf or have difficulty hearing?: No Does the patient have difficulty seeing, even when wearing glasses/contacts?: No Does the patient have difficulty concentrating, remembering, or making decisions?: No Patient able to express need for assistance with ADLs?: Yes Does the patient have difficulty dressing or bathing?: Yes Independently performs ADLs?: Yes (appropriate for developmental age) Does the patient have difficulty walking or climbing stairs?: Yes Weakness of Legs: Both Weakness of Arms/Hands: None  Permission Sought/Granted                  Emotional Assessment   Attitude/Demeanor/Rapport: Unable to Assess Affect (typically observed): Unable to Assess   Alcohol / Substance Use: Not Applicable Psych Involvement: No (comment)  Admission diagnosis:  Respiratory distress [R06.03] Hypoxia [R09.02] Community acquired pneumonia, unspecified laterality [J18.9] Pneumonia due to COVID-19 virus [U07.1, J12.82] Patient Active Problem List   Diagnosis Date Noted  . Pneumonia due to COVID-19 virus 02/03/2020  . Numbness and tingling in right hand 12/31/2019  . Numbness in feet 12/31/2019  . Altered consciousness 11/26/2019  . Frequent falls 11/26/2019  . Weakness of both lower extremities 11/26/2019  . Protruded lumbar disc (L2-3) (Left) 09/10/2019  . Chronic knee pain (Left) 08/14/2019  . Chronic  lumbar radiculopathy/radiculitis (L2) (Left) 08/14/2019  . Abnormal MRI, lumbar spine (07/23/2019) 08/14/2019  . Acute on chronic respiratory failure with hypoxia ([place]) 07/06/2019  . Cervicalgia (Bilateral) (R>L) 09/24/2018  . Spondylosis without myelopathy or radiculopathy, lumbosacral region 09/24/2018  . Epigastric hernia 05/04/2018  . Chronic lower extremity pain (Bilateral) (L>R)  01/25/2018  . Altered mental status, unspecified 12/09/2017  . Chronic hip pain [facility] Area of Pain) (Bilateral) (L>R) 11/27/2017  . COPD (chronic obstructive pulmonary disease) ([place]) 08/11/2017  . Other long term (current) drug therapy 08/08/2017  . Disorder of bone, unspecified 08/08/2017  . COPD with hypoxia ([place]) 06/27/2017  . Arthralgia of acromioclavicular joint (Right) 06/15/2017  . Acromioclavicular joint DJD (Right) 06/15/2017  . 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  . Chronic shoulder pain (Left) 05/11/2017  . Elevated brain natriuretic peptide (BNP) level 05/11/2017  . Grief at loss of child 02/14/2017  . Lumbar facet hypertrophy (multilevel) 11/17/2016  . Lumbar spinal stenosis (L4-5) 11/17/2016  . 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) 11/17/2016  . 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 Lumbosacral radiculopathy at L4 11/17/2016  . Supplemental oxygen dependent 08/01/2016  . Sleep apnea 06/02/2016  . Long term current use of opiate analgesic 01/12/2016  . Long term prescription opiate use 01/12/2016  . Opiate use (15 MME/Day) 01/12/2016  . Lumbar facet syndrome (Bilateral) (L>R) 01/12/2016  . Chronic sacroiliac joint pain (Bilateral) 01/12/2016  . Vitamin D deficiency 12/30/2015  . 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 Obesity, Class III, BMI 40-49.9 (morbid obesity) ([place]) 12/28/2015  . 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[place]) 11/19/2015  . Coagulation disorder ([place]) 10/22/2015  . Chronic pain syndrome (significant psychosocial component) 09/21/2015  . 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 09/21/2015  . Encounter for therapeutic drug level monitoring 09/16/2015  . Encounter for chronic pain management 09/16/2015  . Pain management 09/16/2015  . Neurogenic pain 09/16/2015  . Neuropathic pain 09/16/2015  . Musculoskeletal pain 09/16/2015  . Lumbar spondylosis (L4-5)  09/16/2015  . Chronic low back pain (Primary Area of Pain) (Bilateral) (L>R) 09/16/2015  . Chronic lower extremity pain (Secondary Area of Pain) (Left) 09/16/2015  . Chronic lumbar radicular pain (L4 & S1 Dermatome) (Left) 09/16/2015  . Chronic shoulder pain (Right side) 09/16/2015  . Chronic upper extremity pain (Right-sided) 09/16/2015  . Cervical radiculitis (Right side) 09/16/2015  . Abnormal x-ray of lumbar spine 09/16/2015  . Chronic diastolic heart failure ([place]) 07/15/2015  . Chest pain 07/15/2015  . Tobacco abuse 07/15/2015  . Blood clotting disorder ([place]) 04/21/2015  . Decreased motor strength 07/16/2014  . Clinical depression 07/16/2014  . Chronic rhinitis 07/16/2014  . Carpal tunnel syndrome 07/16/2014  . Anxiety state 07/16/2014  . Neuritis or radiculitis due to rupture of lumbar intervertebral disc 07/02/2014  . DDD (degenerative disc disease), lumbar 07/02/2014  . CAFL (chronic airflow limitation) ([place]) 02/27/2014  . Essential (primary) hypertension 03/27/2008   PCP:  Margo Common, PA Pharmacy:   [facility] [place], [address] [place] [address] Phone: [phone] Fax: [phone]  [id number] [id number]: [phone]: [phone]    Social Determinants of Health (SDOH) Interventions    Readmission Risk Interventions Readmission Risk Prevention Plan 02/05/2020 07/13/2019  Transportation Screening Complete Complete  PCP or Specialist Appt within  5-7 Days - Complete  PCP or Specialist Appt within 3-5 Days Complete -  Home Care Screening - Complete  Medication Review (RN CM) - Complete  HRI or Home Care Consult Complete -  Medication Review (RN Care Manager) Complete -  Some recent data might be hidden

## 2020-02-06 ENCOUNTER — Inpatient Hospital Stay: Payer: Medicare HMO

## 2020-02-06 DIAGNOSIS — J9601 Acute respiratory failure with hypoxia: Secondary | ICD-10-CM | POA: Diagnosis not present

## 2020-02-06 DIAGNOSIS — U071 COVID-19: Secondary | ICD-10-CM | POA: Diagnosis not present

## 2020-02-06 DIAGNOSIS — J1282 Pneumonia due to Coronavirus disease 2019: Secondary | ICD-10-CM | POA: Diagnosis not present

## 2020-02-06 LAB — URINALYSIS, COMPLETE (UACMP) WITH MICROSCOPIC
Bilirubin Urine: NEGATIVE
Glucose, UA: NEGATIVE mg/dL
Hgb urine dipstick: NEGATIVE
Ketones, ur: NEGATIVE mg/dL
Nitrite: NEGATIVE
Protein, ur: NEGATIVE mg/dL
Specific Gravity, Urine: 1.008 (ref 1.005–1.030)
pH: 6 (ref 5.0–8.0)

## 2020-02-06 LAB — COMPREHENSIVE METABOLIC PANEL
ALT: 16 U/L (ref 0–44)
AST: 16 U/L (ref 15–41)
Albumin: 2.5 g/dL — ABNORMAL LOW (ref 3.5–5.0)
Alkaline Phosphatase: 91 U/L (ref 38–126)
Anion gap: 12 (ref 5–15)
BUN: 16 mg/dL (ref 8–23)
CO2: 38 mmol/L — ABNORMAL HIGH (ref 22–32)
Calcium: 8.1 mg/dL — ABNORMAL LOW (ref 8.9–10.3)
Chloride: 94 mmol/L — ABNORMAL LOW (ref 98–111)
Creatinine, Ser: 0.61 mg/dL (ref 0.44–1.00)
GFR calc Af Amer: >60 mL/min (ref >60)
GFR calc non Af Amer: >60 mL/min (ref >60)
Glucose, Bld: 87 mg/dL (ref 70–99)
Potassium: 3.5 mmol/L (ref 3.5–5.1)
Sodium: 144 mmol/L (ref 135–145)
Total Bilirubin: 0.5 mg/dL (ref 0.3–1.2)
Total Protein: 6.2 g/dL — ABNORMAL LOW (ref 6.5–8.1)

## 2020-02-06 LAB — CBC WITH DIFFERENTIAL/PLATELET
Abs Immature Granulocytes: 0.07 10*3/uL (ref 0.00–0.07)
Basophils Absolute: 0 10*3/uL (ref 0.0–0.1)
Basophils Relative: 0 %
Eosinophils Absolute: 0 10*3/uL (ref 0.0–0.5)
Eosinophils Relative: 0 %
HCT: 43 % (ref 36.0–46.0)
Hemoglobin: 12.3 g/dL (ref 12.0–15.0)
Immature Granulocytes: 1 %
Lymphocytes Relative: 19 %
Lymphs Abs: 1.5 10*3/uL (ref 0.7–4.0)
MCH: 26.9 pg (ref 26.0–34.0)
MCHC: 28.6 g/dL — ABNORMAL LOW (ref 30.0–36.0)
MCV: 93.9 fL (ref 80.0–100.0)
Monocytes Absolute: 0.6 10*3/uL (ref 0.1–1.0)
Monocytes Relative: 7 %
Neutro Abs: 5.9 10*3/uL (ref 1.7–7.7)
Neutrophils Relative %: 73 %
Platelets: 203 10*3/uL (ref 150–400)
RBC: 4.58 MIL/uL (ref 3.87–5.11)
RDW: 19.4 % — ABNORMAL HIGH (ref 11.5–15.5)
Smear Review: NORMAL
WBC: 8.1 10*3/uL (ref 4.0–10.5)
nRBC: 0 % (ref 0.0–0.2)

## 2020-02-06 LAB — URINALYSIS, ROUTINE W REFLEX MICROSCOPIC
Bilirubin Urine: NEGATIVE
Glucose, UA: NEGATIVE mg/dL
Hgb urine dipstick: NEGATIVE
Ketones, ur: NEGATIVE mg/dL
Leukocytes,Ua: NEGATIVE
Nitrite: NEGATIVE
Protein, ur: NEGATIVE mg/dL
Specific Gravity, Urine: 1.009 (ref 1.005–1.030)
pH: 7 (ref 5.0–8.0)

## 2020-02-06 LAB — BLOOD GAS, ARTERIAL
Acid-Base Excess: 18.7 mmol/L — ABNORMAL HIGH (ref 0.0–2.0)
Bicarbonate: 46.6 mmol/L — ABNORMAL HIGH (ref 20.0–28.0)
FIO2: 100
O2 Saturation: 92.1 %
Patient temperature: 37
pCO2 arterial: 67 mmHg (ref 32.0–48.0)
pH, Arterial: 7.45 (ref 7.350–7.450)
pO2, Arterial: 61 mmHg — ABNORMAL LOW (ref 83.0–108.0)

## 2020-02-06 LAB — GLUCOSE, CAPILLARY
Glucose-Capillary: 75 mg/dL (ref 70–99)
Glucose-Capillary: 85 mg/dL (ref 70–99)
Glucose-Capillary: 105 mg/dL — ABNORMAL HIGH (ref 70–99)
Glucose-Capillary: 107 mg/dL — ABNORMAL HIGH (ref 70–99)

## 2020-02-06 LAB — PHOSPHORUS: Phosphorus: 3.6 mg/dL (ref 2.5–4.6)

## 2020-02-06 LAB — PROCALCITONIN: Procalcitonin: <0.1 ng/mL

## 2020-02-06 LAB — FIBRIN DERIVATIVES D-DIMER (ARMC ONLY): Fibrin derivatives D-dimer (ARMC): 1422.38 ng/mL (FEU) — ABNORMAL HIGH (ref 0.00–499.00)

## 2020-02-06 LAB — FERRITIN: Ferritin: 118 ng/mL (ref 11–307)

## 2020-02-06 LAB — C-REACTIVE PROTEIN: CRP: 6.7 mg/dL — ABNORMAL HIGH (ref <1.0)

## 2020-02-06 LAB — MAGNESIUM: Magnesium: 2.1 mg/dL (ref 1.7–2.4)

## 2020-02-06 IMAGING — CT CT HEAD W/O CM
4 series · 16 of 47 positions shown, 18 images · non-contrast
Comparison: CT head without contrast 12/09/2017. MR head without
contrast 12/10/2017

CLINICAL DATA: Focal neuro deficit for greater than 6 hours.
Patient found on floor. Fall between 4 a.m. and 7 a.m.

EXAM:
CT HEAD WITHOUT CONTRAST
TECHNIQUE: Contiguous axial images were obtained from the base of the skull
through the vertex without intravenous contrast.

[Series 3: head wo · axial · 0.44mm/px · z∈[-146,-26]mm · 7 of 32 slices shown, 9 images]
[im 4/32  brain]
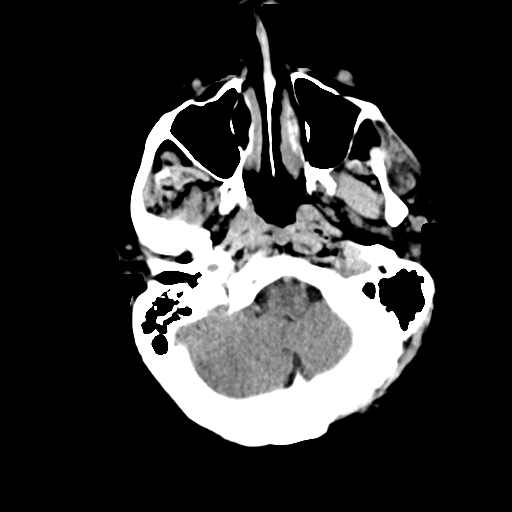
[im 4/32  bone]
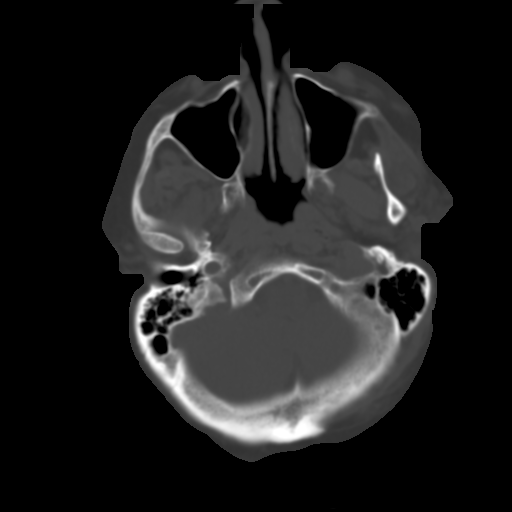
[im 8/32  brain]
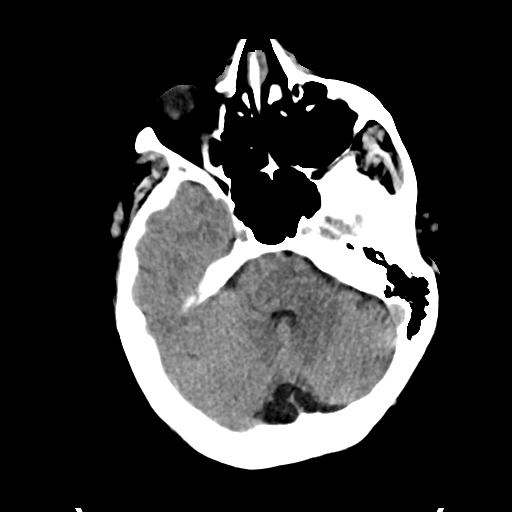
[im 12/32  brain]
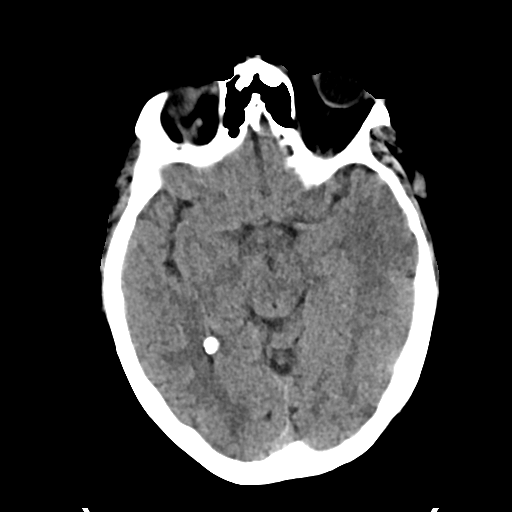
[im 16/32  brain]
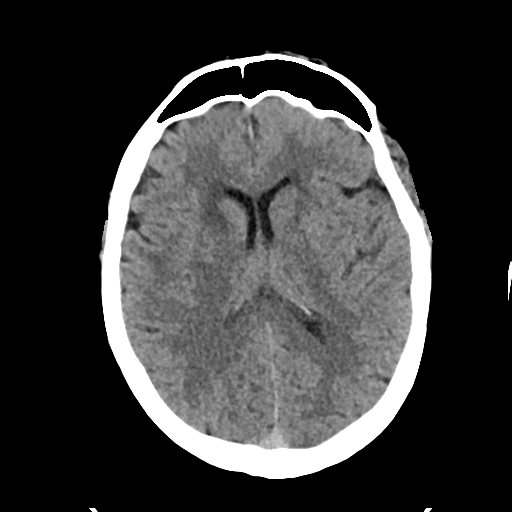
[im 20/32  brain]
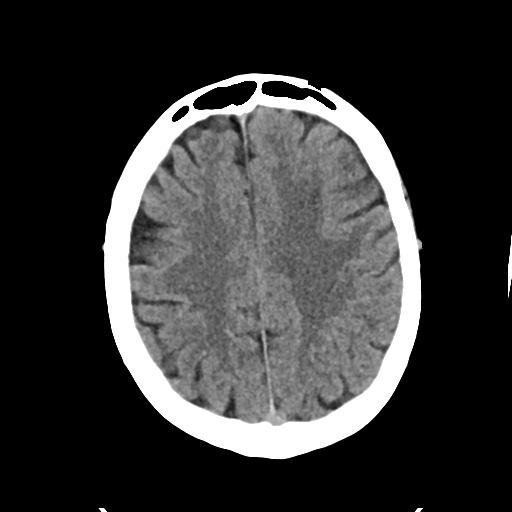
[im 20/32  bone]
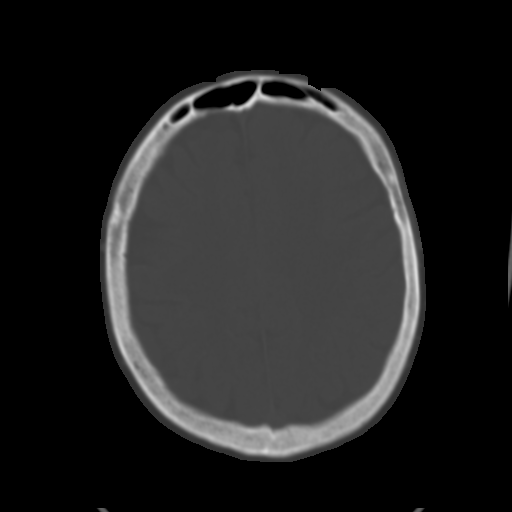
[im 24/32  brain]
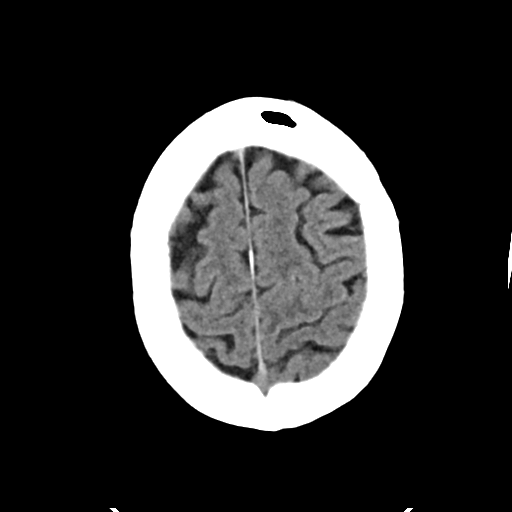
[im 28/32  brain]
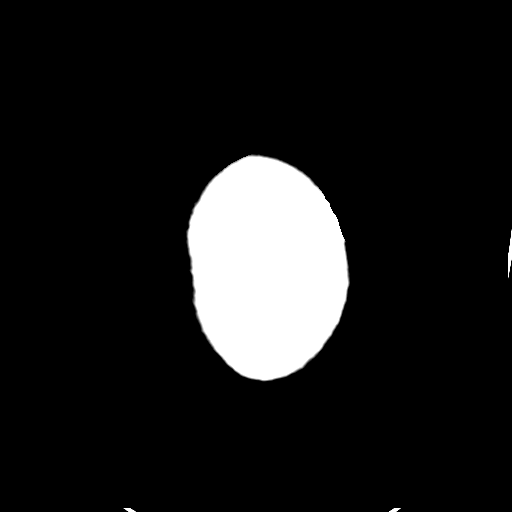

[Series 4: head bone · axial · 0.44mm/px · z∈[-147,-115]mm · 3 of 79 slices shown]
[im 8/79  bone]
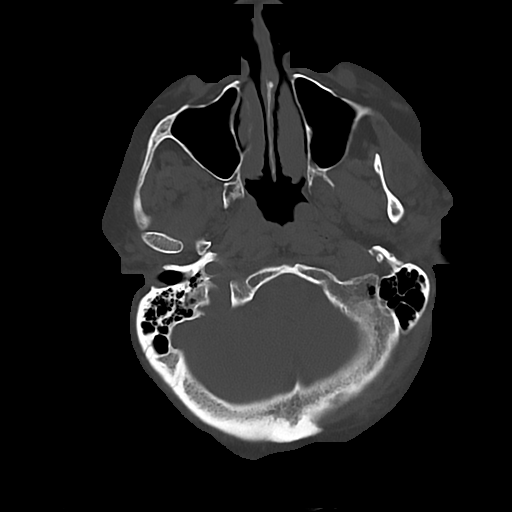
[im 16/79  bone]
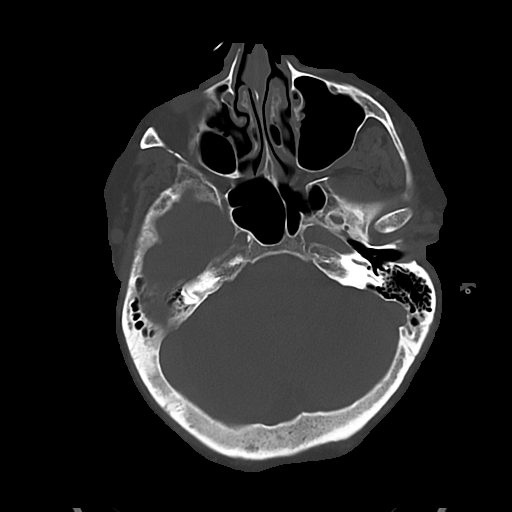
[im 24/79  bone]
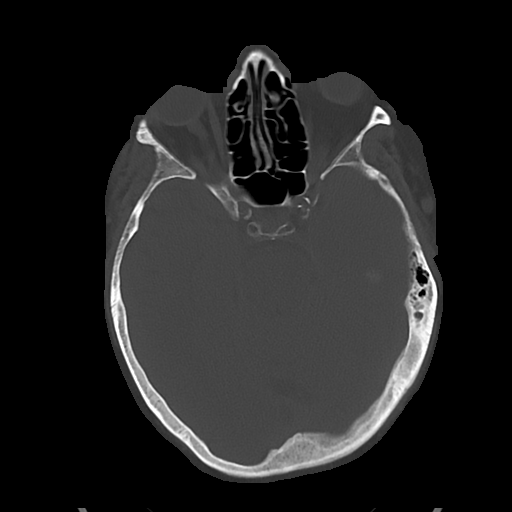

[Series 5: cor soft · coronal · 0.31mm/px · 3 of 71 slices shown]
[im 24/71  brain]
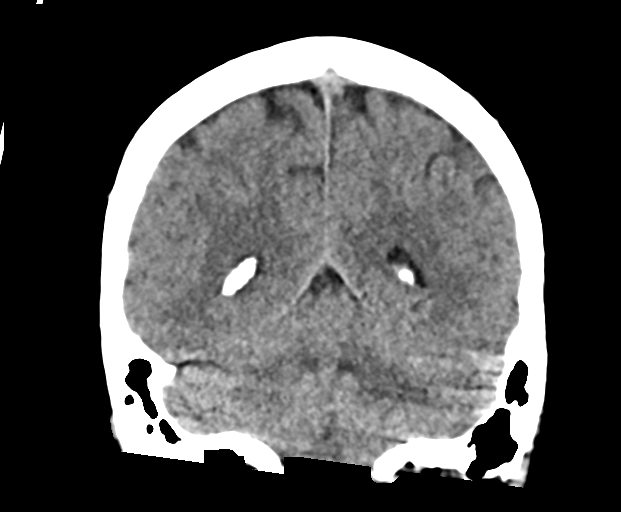
[im 32/71  brain]
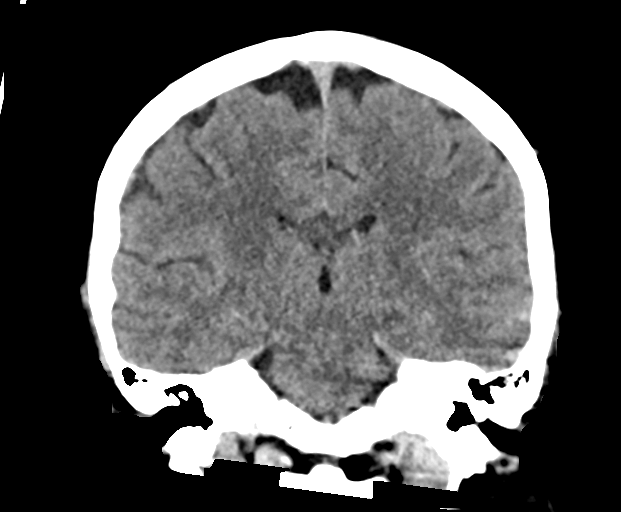
[im 39/71  brain]
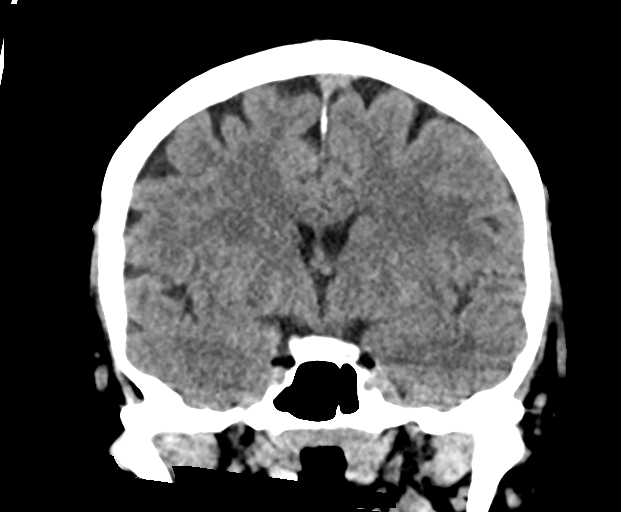

[Series 6: sag soft · sagittal · 0.31mm/px · 3 of 65 slices shown]
[im 25/65  brain]
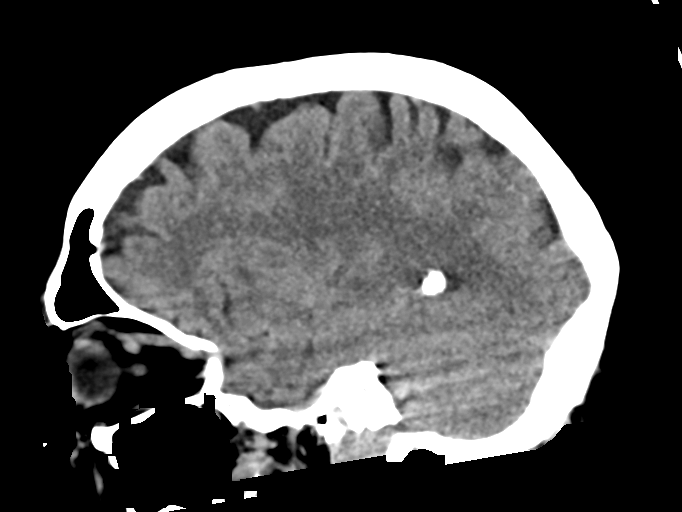
[im 33/65  brain]
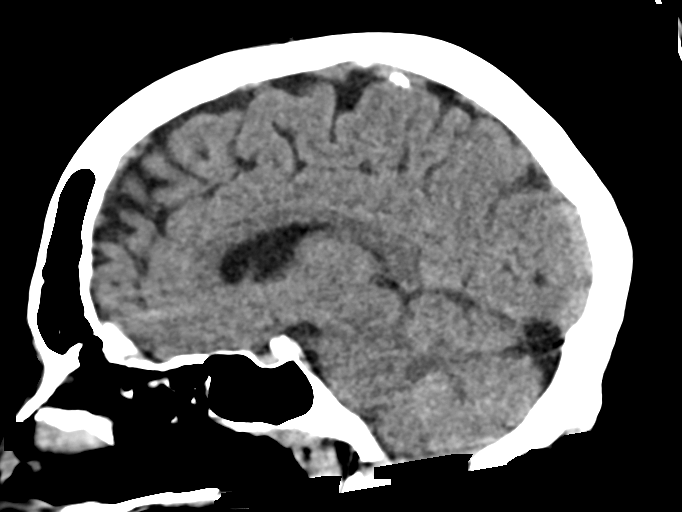
[im 41/65  brain]
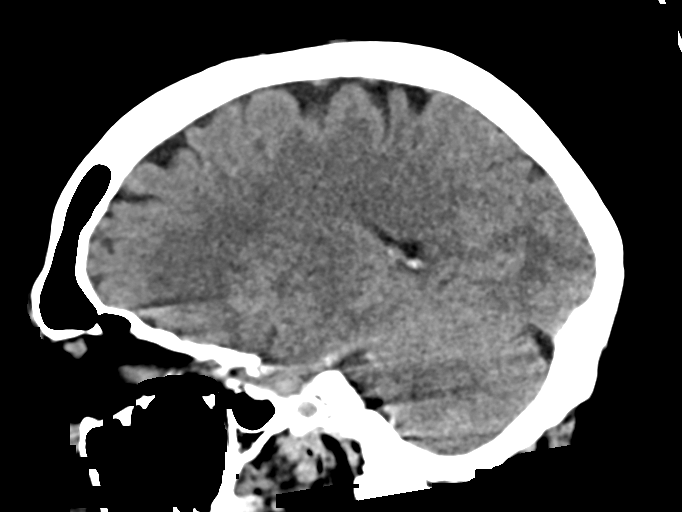

[16 of 47 positions shown; findings below may reference images not displayed]

FINDINGS: Brain: Moderate white matter changes are stable from previous exams.
No acute infarct, hemorrhage, or mass lesion is present. Ventricles
are normal size. No significant extraaxial fluid collection is
present. White matter changes of the brainstem are stable.
Cerebellum is normal.

Vascular: Atherosclerotic calcifications are present within the
cavernous internal carotid arteries bilaterally. There is no
hyperdense vessel.

Skull: Insert normal skull insert normal scalp

Sinuses/Orbits: The paranasal sinuses and mastoid air cells are
clear. The globes and orbits are within normal limits.
IMPRESSION: 1. Stable atrophy and white matter disease. This likely reflects the
sequela of chronic microvascular ischemia.
2. No acute intracranial abnormality or significant interval change.
No acute trauma or focal abnormality to explain the patient's fall
or unresponsiveness.

## 2020-02-06 IMAGING — CT CT CERVICAL SPINE W/O CM
3 of 9 series · 10 of 33 positions shown, 11 images · non-contrast
Comparison: CT cervical spine 12/31/2013.

CLINICAL DATA: Found down this morning. Altered mental status.

EXAM:
CT CERVICAL SPINE WITHOUT CONTRAST
TECHNIQUE: Multidetector CT imaging of the cervical spine was performed without
intravenous contrast. Multiplanar CT image reconstructions were also
generated.

[Series 5: c_spine 2.0 st · axial · 0.42mm/px · z∈[-199,-97]mm · 3 of 103 slices shown, 4 images]
[im 26/103  soft-tissue]
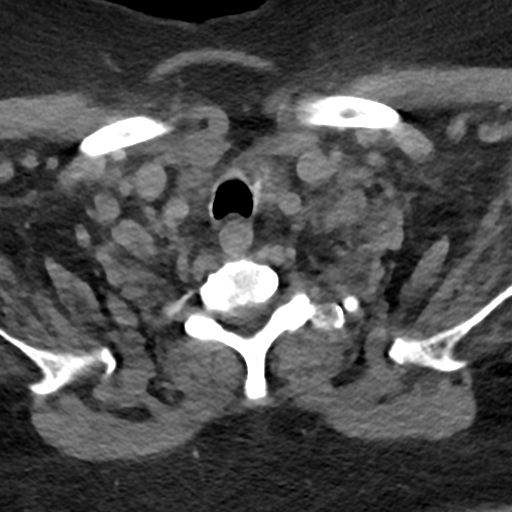
[im 26/103  bone]
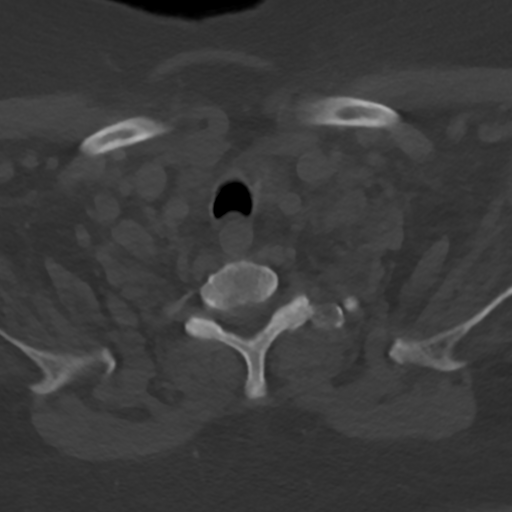
[im 52/103  bone]
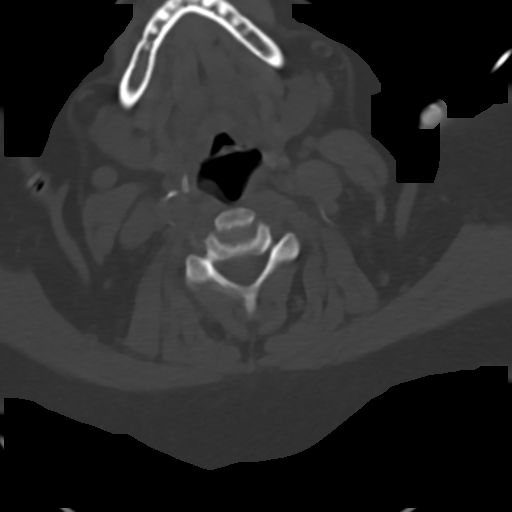
[im 77/103  bone]
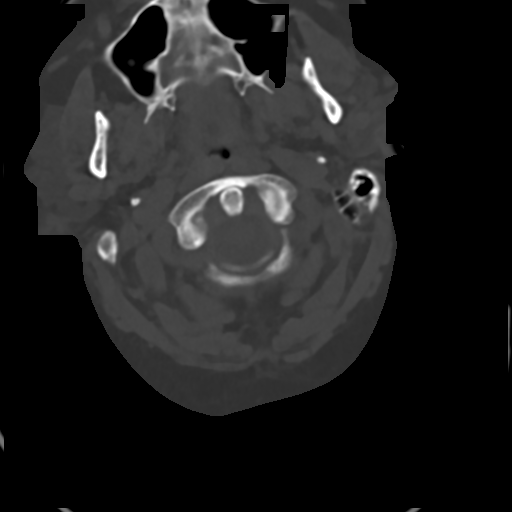

[Series 9: c_spine 2.0 sag bone · sagittal · 0.30mm/px · 5 of 61 slices shown]
[im 11/61  bone]
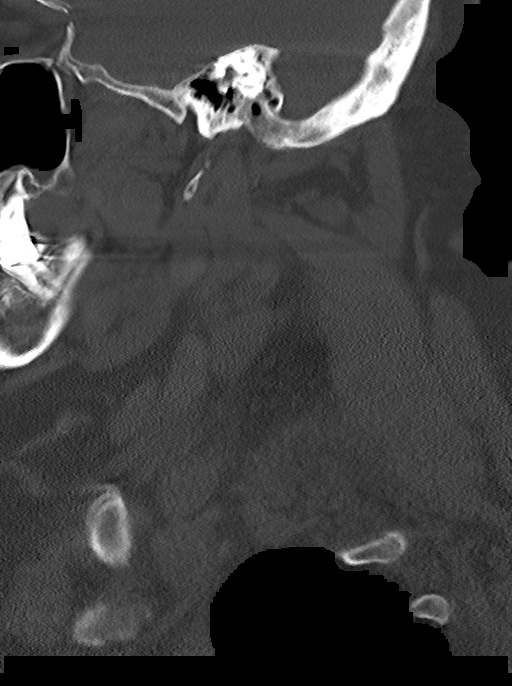
[im 21/61  bone]
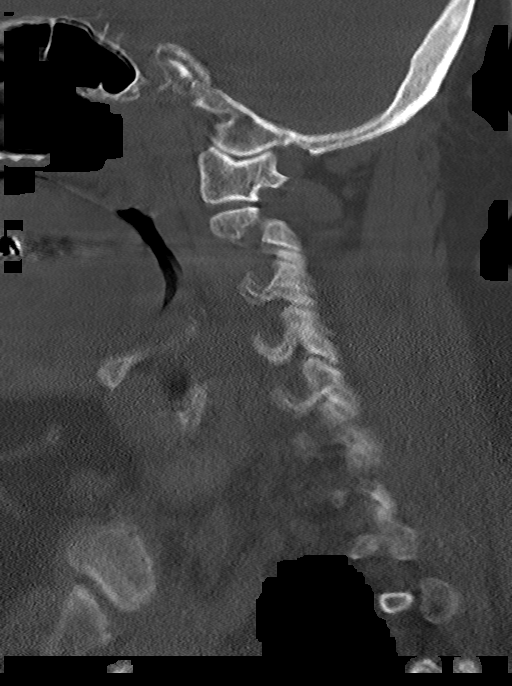
[im 31/61  bone]
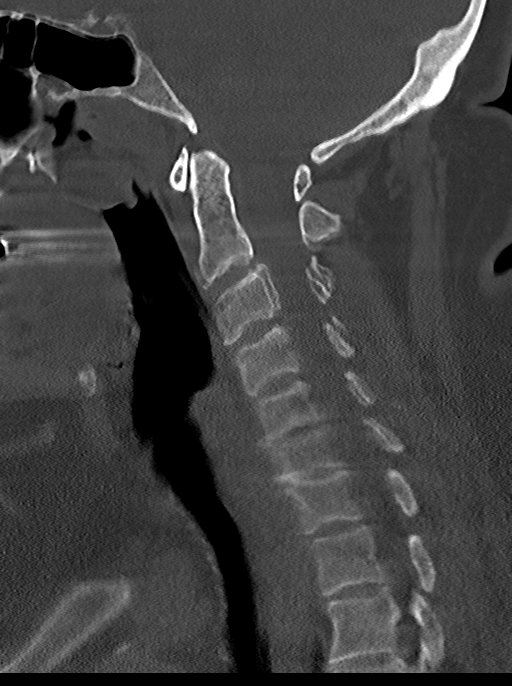
[im 41/61  bone]
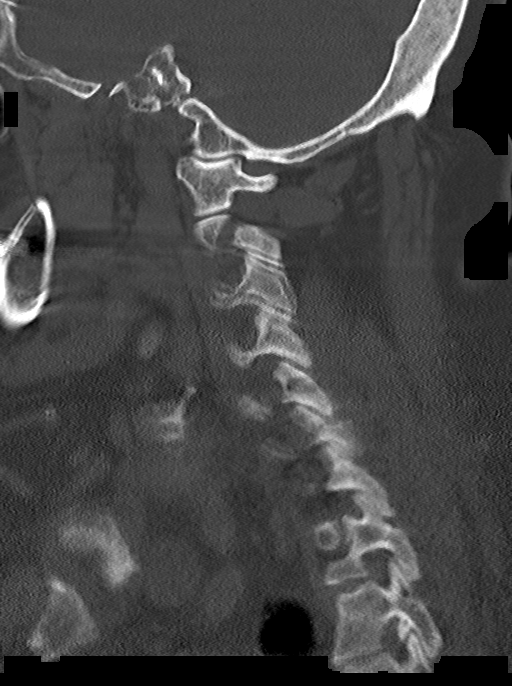
[im 51/61  bone]
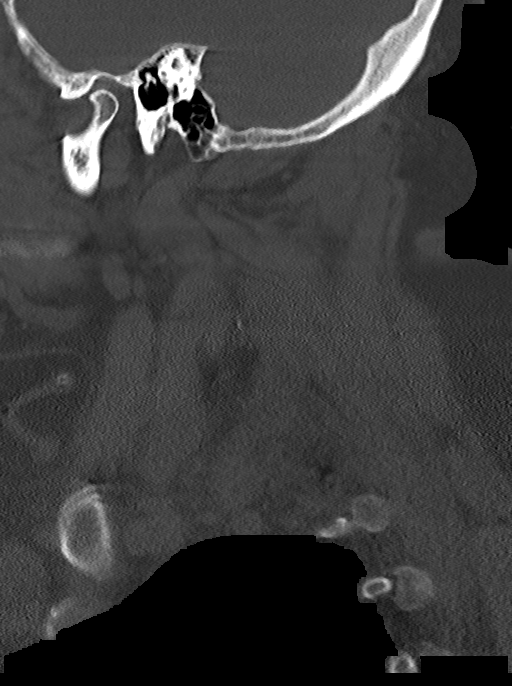

[Series 18: c_spine 2.0 cor bone · coronal · 0.25mm/px · 2 of 41 slices shown]
[im 14/41  bone]
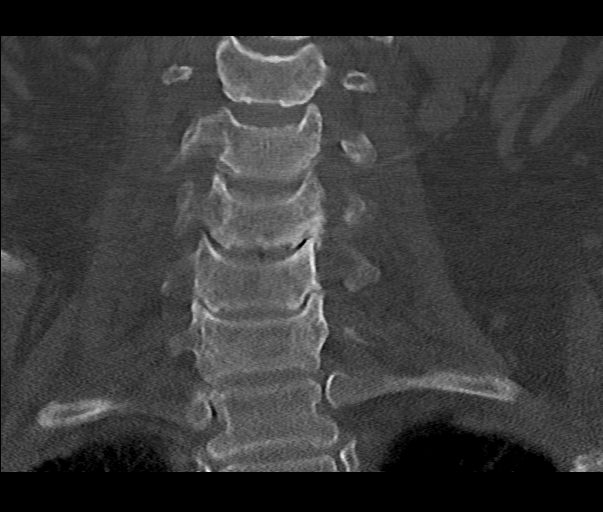
[im 27/41  bone]
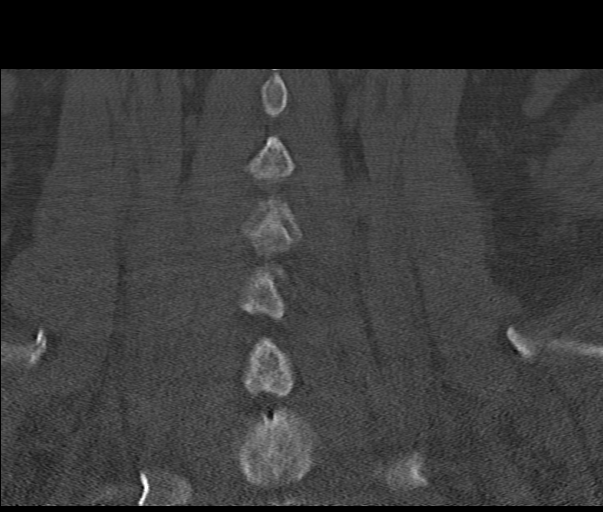

[10 of 33 positions shown; findings below may reference images not displayed]

FINDINGS: Despite efforts by the technologist and patient, mild to moderate
motion artifact is present on today's exam and could not be
eliminated. This reduces exam sensitivity and specificity. Some of
the images were repeated, but are still motion degraded.

Alignment: Mild straightening. No focal angulation or listhesis.

Skull base and vertebrae: Due to the motion artifact, examination is
suboptimal for exclusion of subtle injuries. No fractures are
identified. There is no evidence of traumatic subluxation.
Ossification of the ligamentum nuchae at C4-5 is unchanged.

Soft tissues and spinal canal: No prevertebral fluid or swelling. No
visible canal hematoma.

Disc levels: There is spondylosis with loss of disc height and
uncinate spurring at the C5-6 and C6-7 levels. These findings have
progressed compared with the prior study. There is resulting
mild-to-moderate foraminal narrowing bilaterally. No large disc
herniation identified.

Upper chest: Aortic and great vessel atherosclerosis. Emphysematous
changes at both lung apices.

Other: Bilateral carotid atherosclerosis.
IMPRESSION: 1. Study is motion degraded. No evidence of acute cervical spine
fracture, traumatic subluxation or static signs of instability.
2. Progressive spondylosis at C5-6 and C6-7 with resulting foraminal
narrowing bilaterally.
3. Aortic Atherosclerosis ([id number]-BH2.2) and Emphysema ([id number]).

## 2020-02-06 MED ORDER — KETOROLAC TROMETHAMINE 15 MG/ML IJ SOLN: 15.0000 mg | Freq: Once | INTRAMUSCULAR | Status: DC

## 2020-02-06 MED ORDER — TRAMADOL HCL 50 MG PO TABS
50.0000 mg | ORAL_TABLET | Freq: Four times a day (QID) | ORAL | Status: DC | PRN
  Administered 2020-02-06 – 2020-02-08 (×2): 50 mg via ORAL
  Filled 2020-02-06 (×2): qty 1

## 2020-02-06 MED ORDER — METHOCARBAMOL 500 MG PO TABS
500.0000 mg | ORAL_TABLET | Freq: Four times a day (QID) | ORAL | Status: DC | PRN
  Administered 2020-02-06 – 2020-02-07 (×3): 500 mg via ORAL
  Filled 2020-02-06 (×5): qty 1

## 2020-02-06 NOTE — Progress Notes (Signed)
PT Cancellation Note  Patient Details Name: Theresa [name] MRN: [id number] DOB: September 30, 1956   Cancelled Treatment:    Reason Eval/Treat Not Completed: Patient not medically ready;Patient at procedure or test/unavailable(Chart reviewed, Evaluation attempted. Pt going off floor for imaging shortly. WIll hold eval and attempt again at later date/time as able.)  2:48 PM, 02/06/20 Etta Grandchild, PT, DPT Physical Therapist - [facility]  [phone] ([place])    [place] [facility] 02/06/2020, 2:48 PM

## 2020-02-06 NOTE — Progress Notes (Signed)
OT Cancellation Note  Patient Details Name: Theresa [name] MRN: [id number] DOB: 12-25-1955   Cancelled Treatment:    Reason Eval/Treat Not Completed: Medical issues which prohibited therapy;Other (comment). Order received and chart reviewed. Upon arrival to unit, spoke with pt primary RN who indicated pt to be leaving to radiology shortly. Pt noted to be pending for imaging of her R hip 2/2 severe pain. Will hold OT evaluation at this time and re-attempt next date as available/pt medically appropriate for therapy.   Shara Blazing, M.S., OTR/L Ascom: [phone] 02/06/20, 2:32 PM

## 2020-02-06 NOTE — Progress Notes (Signed)
Name: Theresa [name] MRN: [id number] DOB: May 14, 1956    ADMISSION DATE:  02/03/2020 CONSULTATION DATE: 02/03/2020  REFERRING MD : Dr. Manuella Ghazi   CHIEF COMPLAINT: Shortness of Breath   BRIEF PATIENT DESCRIPTION: 64 yo female admitted with acute on chronic hypoxic respiratory failure COVID-19 pneumonia and AECOPD requiring Bipap    EVENTS:  4/22 -patient is able to self prone , she seems clinically improved.   HISTORY OF PRESENT ILLNESS:  This is a 64 yo female with a PMH of Tobacco Abuse, Obesity, GERD, DDD, COPD (Bipap qhs), CHF, and Anxiety. She presented to [facility] ER on 04/19 with c/o shortness of breath, cough, nausea, vomiting, and diarrhea onset of symptoms a few days prior to presentation although n/v/d has since resolved.  In the ER CXR concerning for multifocal pneumonia and/or possible pulmonary edema.  Lab results revealed K+ 2.8, glucose 143, BNP 190, LDH 361, troponin 25, d-dimer 3,739.85, fibrinogen 707, and wbc 16.8.  Influenza PCR negative, however COVID-19 positive.  Pt received 40 mg iv lasix, 125 mg iv solumedrol x1 dose, duonebs x3, remdesivir, vancomycin, and zosyn.  Due to continued tachypnea and hypoxia pt placed on Bipap.  She was subsequently admitted to the stepdown unit per hospitalist team PCCM consulted to assist with management.   PAST MEDICAL HISTORY :   has a past medical history of Acute postoperative pain (10/03/2017), Anxiety, BiPAP (biphasic positive airway pressure) dependence, Carpal tunnel syndrome, CHF (congestive heart failure) ([place]), CHF (congestive heart failure) ([place]), Chronic generalized abdominal pain, Chronic rhinitis, COPD (chronic obstructive pulmonary disease) ([place]), DDD (degenerative disc disease), cervical, DDD (degenerative disc disease), lumbosacral, Depression, Dyspnea, Edema, Flu, Gastritis, GERD (gastroesophageal reflux disease), Hematuria, Hernia of abdominal wall (07/17/2018), Hernia, abdominal, Hypertension, Kidney stones, Low back pain,  Lumbar radiculitis, Malodorous urine, Muscle weakness, Obesity, Oxygen dependent, Renal cyst, Sensory urge incontinence, Thyroid activity decreased, Tobacco abuse, and Wheezing.  has a past surgical history that includes Abdominal hysterectomy; Tonsillectomy; Carpal tunnel release (Left, 2012); Tubal ligation; epigastric hernia repair (N/A, 08/13/2018); Umbilical hernia repair (N/A, 08/13/2018); and RIGHT/LEFT HEART CATH AND CORONARY ANGIOGRAPHY (N/A, 07/11/2019). Prior to Admission medications   Medication Sig Start Date End Date Taking? Authorizing Provider  albuterol (VENTOLIN HFA) 108 (90 Base) MCG/ACT inhaler Inhale 2 puffs into the lungs every 6 (six) hours as needed for wheezing or shortness of breath. 10/23/19   Rudene Re, MD  aspirin EC 81 MG tablet Take 81 mg by mouth daily.    [provider]  baclofen (LIORESAL) 10 MG tablet Take 1 tablet (10 mg total) by mouth 3 (three) times daily. 01/13/20 07/11/20  Milinda Pointer, MD  bisoprolol-hydrochlorothiazide [facility]) 10-6.25 MG tablet TAKE 1 TABLET BY MOUTH DAILY Patient not taking: Reported on 01/22/2020 06/27/19   Chrismon, Vickki Muff, PA  Cholecalciferol ([facility] D3) 2000 units CAPS Take 2,000 Units by mouth daily.     [provider]  digoxin (LANOXIN) 0.25 MG tablet TAKE 1 TABLET(0.25 MG) BY MOUTH DAILY 11/17/19   Chrismon, Vickki Muff, PA  Fluticasone-Umeclidin-Vilant 100-62.5-25 MCG/INH AEPB Inhale 1 puff into the lungs daily. 10/03/18   [provider]  gabapentin (NEURONTIN) 400 MG capsule Take 1 capsule (400 mg total) by mouth 3 (three) times daily. 01/13/20 07/11/20  Milinda Pointer, MD  HYDROcodone-acetaminophen (NORCO/VICODIN) 5-325 MG tablet Take 1 tablet by mouth every 8 (eight) hours as needed for severe pain. Must last 30 days 01/13/20 02/12/20  Milinda Pointer, MD  HYDROcodone-acetaminophen (NORCO/VICODIN) 5-325 MG tablet Take 1 tablet by mouth every 8 (  eight) hours as needed for severe pain. Must last 30  days Patient not taking: Reported on 01/22/2020 02/12/20 03/13/20  Milinda Pointer, MD  HYDROcodone-acetaminophen (NORCO/VICODIN) 5-325 MG tablet Take 1 tablet by mouth every 8 (eight) hours as needed for severe pain. Must last 30 days Patient not taking: Reported on 01/22/2020 03/13/20 04/12/20  Milinda Pointer, MD  ipratropium-albuterol (DUONEB) 0.5-2.5 (3) MG/3ML SOLN USE 1 VIAL VIA NEBULIZER EVERY 6 HOURS AS NEEDED FOR SHORTNESS OF BREATH OR WHEEZING 10/14/19   Chrismon, Vickki Muff, PA  levothyroxine (SYNTHROID) 25 MCG tablet TAKE 1 TABLET(25 MCG) BY MOUTH DAILY BEFORE BREAKFAST Patient not taking: Reported on 01/22/2020 12/27/19   Chrismon, Vickki Muff, PA  potassium chloride SA (KLOR-CON) 20 MEQ tablet TAKE 1 TABLET(20 MEQ) BY MOUTH TWICE DAILY 09/15/19   Darylene Price A, FNP  sertraline (ZOLOFT) 100 MG tablet TAKE 2 TABLETS BY MOUTH EVERY MORNING 11/24/19   Chrismon, Vickki Muff, PA  torsemide (DEMADEX) 20 MG tablet TAKE 2 TABLETS(40 MG) BY MOUTH TWICE DAILY Patient taking differently: Take 40 mg by mouth 2 (two) times daily.  11/24/19   [name], FNP   Allergies  Allergen Reactions  . Codeine Nausea And Vomiting  . Meloxicam Other (See Comments)    Stomach pain    FAMILY HISTORY:  family history includes Alcohol abuse in her father; Breast cancer in her sister; Cancer in her brother, brother, and sister; Coronary artery disease in her mother; Heart disease in her father and mother; Lung cancer in her sister; Pneumonia in her brother; Stroke in her mother. SOCIAL HISTORY:  reports that she has been smoking cigarettes. She has a 22.00 pack-year smoking history. She has never used smokeless tobacco. She reports that she does not drink alcohol or use drugs.  REVIEW OF SYSTEMS: Positives in BOLD  Constitutional: Negative for fever, chills, weight loss, malaise/fatigue and diaphoresis.  HENT: Negative for hearing loss, ear pain, nosebleeds, congestion, sore throat, neck pain, tinnitus and ear  discharge.   Eyes: Negative for blurred vision, double vision, photophobia, pain, discharge and redness.  Respiratory: cough, hemoptysis, sputum production, shortness of breath, wheezing and stridor.   Cardiovascular: Negative for chest pain, palpitations, orthopnea, claudication, leg swelling and PND.  Gastrointestinal: Negative for heartburn, nausea, vomiting, abdominal pain, diarrhea, constipation, blood in stool and melena.  Genitourinary: Negative for dysuria, urgency, frequency, hematuria and flank pain.  Musculoskeletal: left flank pain, myalgias, back pain, joint pain and falls.  Skin: Negative for itching and rash.  Neurological: Negative for dizziness, tingling, tremors, sensory change, speech change, focal weakness, seizures, loss of consciousness, weakness and headaches.  Endo/Heme/Allergies: Negative for environmental allergies and polydipsia. Does not bruise/bleed easily.  SUBJECTIVE:  No complaints at this time states "I am hungry"  VITAL SIGNS: Temp:  [98.2 F (36.8 C)-98.6 F (37 C)] 98.3 F (36.8 C) (04/22 1400) Pulse Rate:  [61-78] 72 (04/22 1400) Resp:  [14-22] 20 (04/22 1000) BP: (95-129)/(56-74) 116/74 (04/22 1400) SpO2:  [88 %-96 %] 94 % (04/22 1400) FiO2 (%):  [55 %] 55 % (04/22 0147) Weight:  [96.7 kg] 96.7 kg (04/22 0147)  PHYSICAL EXAMINATION: General: acutely ill appearing female, NAD on Bipap  Neuro: alert and oriented, follows commands, PERRL  HEENT: supple, no JVD  Cardiovascular: nsr, no R/G  Lungs: crackles and inspiratory wheezes throughout, even, slightly tachypneic  Abdomen: +BS x4, obese, soft, non distended, non tender Musculoskeletal: normal bulk and tone, no edema  Skin: intact no rashes or lesions present   Recent  Labs  Lab 02/04/20 0438 02/05/20 0354 02/06/20 0420  NA 140 143 144  K 3.3* 3.2* 3.5  CL 98 96* 94*  CO2 31 36* 38*  BUN _0 CREATININE 0.54 0.53 0.61  GLUCOSE 140* 101* 87   Recent Labs  Lab 02/04/20 0438  02/05/20 0354 02/06/20 0420  HGB 12.8 12.2 12.3  HCT 41.4 42.2 43.0  WBC 10.8* 13.6* 8.1  PLT 154 168 203   US Venous Img Lower Unilateral Right (DVT)  Result Date: 02/06/2020 CLINICAL DATA:  Right lower extremity pain and edema. History of smoking. Evaluate for DVT. EXAM: RIGHT LOWER EXTREMITY VENOUS DOPPLER ULTRASOUND TECHNIQUE: Gray-scale sonography with graded compression, as well as color Doppler and duplex ultrasound were performed to evaluate the lower extremity deep venous systems from the level of the common femoral vein and including the common femoral, femoral, profunda femoral, popliteal and calf veins including the posterior tibial, peroneal and gastrocnemius veins when visible. The superficial great saphenous vein was also interrogated. Spectral Doppler was utilized to evaluate flow at rest and with distal augmentation maneuvers in the common femoral, femoral and popliteal veins. COMPARISON:  None. FINDINGS: Contralateral Common Femoral Vein: Respiratory phasicity is normal and symmetric with the symptomatic side. No evidence of thrombus. Normal compressibility. Common Femoral Vein: No evidence of thrombus. Normal compressibility, respiratory phasicity and response to augmentation. Saphenofemoral Junction: No evidence of thrombus. Normal compressibility and flow on color Doppler imaging. Profunda Femoral Vein: No evidence of thrombus. Normal compressibility and flow on color Doppler imaging. Femoral Vein: No evidence of thrombus. Normal compressibility, respiratory phasicity and response to augmentation. Popliteal Vein: No evidence of thrombus. Normal compressibility, respiratory phasicity and response to augmentation. Calf Veins: No evidence of thrombus. Normal compressibility and flow on color Doppler imaging. Superficial Great Saphenous Vein: No evidence of thrombus. Normal compressibility. Venous Reflux:  None. Other Findings:  None. IMPRESSION: No evidence of DVT within the right lower  extremity. Electronically Signed   By: Sandi Mariscal M.D.   On: 02/06/2020 10:34   DG Chest Port 1 View  Result Date: 02/06/2020 CLINICAL DATA:  Respiratory distress due to COVID. EXAM: PORTABLE CHEST 1 VIEW COMPARISON:  Chest x-rays dated 02/03/2020 10/22/2019. FINDINGS: Stable cardiomegaly. The diffuse bilateral patchy alveolar and interstitial airspace opacities are stable in the short-term interval. No pneumothorax is seen. Osseous structures about the chest are unremarkable. IMPRESSION: 1. Stable chest x-ray. Stable diffuse bilateral patchy alveolar and interstitial airspace opacities, compatible with multifocal pneumonia. 2. Stable cardiomegaly. Electronically Signed   By: Franki Cabot M.D.   On: 02/06/2020 11:20   DG HIP UNILAT WITH PELVIS 2-3 VIEWS RIGHT  Result Date: 02/06/2020 CLINICAL DATA:  Right hip and inguinal pain EXAM: DG HIP (WITH OR WITHOUT PELVIS) 2-3V RIGHT COMPARISON:  12/29/2015 FINDINGS: Frontal view of the pelvis as well as frontal and frogleg lateral views of the right hip are obtained. No acute displaced fracture. Alignment is anatomic. Joint spaces are relatively well preserved. There is prominent spondylosis at the lumbosacral junction. Asymmetric sclerosis of the left sacroiliac joint. IMPRESSION: 1. No acute displaced fracture. 2. Prominent lower lumbar spondylosis. 3. Asymmetric left-sided sacroiliitis. Electronically Signed   By: Randa Ngo M.D.   On: 02/06/2020 17:11    ASSESSMENT / PLAN:  Acute on chronic hypoxic respiratory failure secondary to COVID-19 negative, mild pulmonary edema, and AECOPD  Hx: Current everyday smoker and Bipap qhs  Prn Bipap or supplemental O2 for dyspnea and/or hypoxia  Scheduled and prn bronchodilator  therapy Continue decadron, remdesivir, and vitamins  Trend inflammatory marker and PCT  Follow cultures If PCT positive will continue abx therapy  Pulmonary hygiene and OOB to chair once respiratory status stabilized  IV lasix x1  dose: trend BMP, replace electrolytes as indicated, monitor UOP Smoking cessation counseling provided Maintain airborne and contact precautions   Best Practice: VTE px: subq lovenox SUP px: iv protonix  Diet: heart healthy diet   Critical care provider statement:    Critical care time (minutes):  33   Critical care time was exclusive of:  Separately billable procedures and  treating other patients   Critical care was necessary to treat or prevent imminent or  life-threatening deterioration of the following conditions:  acute hypoxemic respiratory failure due to [place] care was time spent personally by me on the following  activities:  Development of treatment plan with patient or surrogate,  discussions with consultants, evaluation of patient's response to  treatment, examination of patient, obtaining history from patient or  surrogate, ordering and performing treatments and interventions, ordering  and review of laboratory studies and re-evaluation of patient's condition   I assumed direction of critical care for this patient from another  provider in my specialty: no

## 2020-02-06 NOTE — Progress Notes (Signed)
PROGRESS NOTE    Theresa [name]  HKV:[id number] DOB: [birth year] DOA: 02/03/2020 PCP: Margo Common, PA   Brief Narrative:  Theresa [name]  is a 64 y.o. female with a known history of anxiety, CHF, COPD, depression, GERDbeing admitted for COVID pneumonia. She came in for shortness of breath worsening over the last week with productive cough. Patient denies any fevers, states she usually wears 2-1/2 L of oxygen at baseline. She presents with hypoxia and tachypnea. O2 sats in 80s requiring BiPAP. covid is +.  4/21: Seen and examined.  Remains in stepdown status.  Weaned to high flow nasal cannula.  Currently on 15 L high flow.  Mentating well.  No fevers over interval.  4/22: Seen and examined.  Remains in stepdown status.  Remains on 15 L high flow nasal cannula.  Used BiPAP for approximately 5 hours last night.  Mentating well.  Endorsing hunger.  No fevers noted over interval.   Assessment & Plan:   Active Problems:   Pneumonia due to COVID-19 virus Acute hypoxic resp failure - Present on admission, Due to COVID pneumonia. required BiPAP initially Weaned off to 15 liters O2 - maintains sats in 88% Plan: - high risk for decompensation, will monitor in SDU for now - appreciate PCCM assistance - Continue decadron, remdesivir (day 4/5), and vitamins  - Continue ivermectin per PCCM Trend inflammatory marker daily Follow cultures Will consider Actemra if patient does not clinically respond   Hypokalemia Replete & recheck, check Mg  Acute on chronic diastolic CHF - Last echo showed normal EF - given one dose of lasix per PCCM - seems well compensated at this time  Hyperglycemia No h/o DM, likely due to steroids, SSI for now HbA1c 6.4     DVT prophylaxis: Lovenox Code Status: Full Family Communication: None today Disposition Plan:Status is: Inpatient  Remains inpatient appropriate because:Inpatient level of care appropriate due to severity of illness   Dispo:  The patient is from: Home              Anticipated d/c is to: SNF              Anticipated d/c date is: > 3 days              Patient currently is not medically stable to d/c.  Persistent severe hypoxic respiratory failure in setting of COVID 19       Consultants:   ICU  Procedures:   BPAP  Antimicrobials:   remdesivir   Subjective: Seen and examined Reports some improvement Weaned from BPAP On HFNC at 15L  Objective: Vitals:   02/06/20 0800 02/06/20 0852 02/06/20 1000 02/06/20 1200  BP: 118/62  123/64 129/65  Pulse: 72 75 76 74  Resp: 20 (!) 22 20   Temp: 98.4 F (36.9 C)  98.3 F (36.8 C) 98.2 F (36.8 C)  TempSrc: Axillary  Axillary Axillary  SpO2: 93% 92% 91% 92%  Weight:      Height:        Intake/Output Summary (Last 24 hours) at 02/06/2020 1451 Last data filed at 02/06/2020 0147 Gross per 24 hour  Intake 102.71 ml  Output 2500 ml  Net -2397.29 ml   Filed Weights   02/03/20 1414 02/05/20 0500 02/06/20 0147  Weight: 91.6 kg 91.5 kg 96.7 kg    Examination:  General exam: Appears calm and comfortable  Respiratory system: Scattered bl rhonchi, normal WOB Cardiovascular system: S1 & S2 heard, RRR. No JVD, murmurs, rubs, gallops or  clicks. No pedal edema. Gastrointestinal system: Abdomen is nondistended, soft and nontender. No organomegaly or masses felt. Normal bowel sounds heard. Central nervous system: Alert and oriented. No focal neurological deficits. Extremities: Symmetric 5 x 5 power. Skin: No rashes, lesions or ulcers Psychiatry: Judgement and insight appear normal. Mood & affect appropriate.     Data Reviewed: I have personally reviewed following labs and imaging studies  CBC: Recent Labs  Lab 02/03/20 1423 02/04/20 0438 02/05/20 0354 02/06/20 0420  WBC 18.1*  16.9* 10.8* 13.6* 8.1  NEUTROABS  --  9.5* 11.2* 5.9  HGB 13.6  13.8 12.8 12.2 12.3  HCT 45.4  45.4 41.4 42.2 43.0  MCV 91.2  89.9 89.0 93.2 93.9  PLT 164  172  154 168 297   Basic Metabolic Panel: Recent Labs  Lab 02/03/20 1423 02/03/20 2148 02/04/20 0438 02/05/20 0354 02/06/20 0420  NA 138  --  140 143 144  K 2.8* 2.8* 3.3* 3.2* 3.5  CL 95*  --  98 96* 94*  CO2 33*  --  31 36* 38*  GLUCOSE 143*  --  140* 101* 87  BUN 10  --  _0 CREATININE 0.54  0.64  --  0.54 0.53 0.61  CALCIUM 8.9  --  8.3* 8.7* 8.1*  MG  --  2.0 2.2 2.6* 2.1  PHOS  --  3.1 3.4 3.5 3.6   GFR: Estimated Creatinine Clearance: 79.1 mL/min (by C-G formula based on SCr of 0.61 mg/dL). Liver Function Tests: Recent Labs  Lab 02/04/20 0438 02/05/20 0354 02/06/20 0420  AST _1 ALT _2 ALKPHOS 98 94 91  BILITOT 0.7 0.6 0.5  PROT 6.7 6.1* 6.2*  ALBUMIN 2.6* 2.5* 2.5*   No results for input(s): LIPASE, AMYLASE in the last 168 hours. No results for input(s): AMMONIA in the last 168 hours. Coagulation Profile: No results for input(s): INR, PROTIME in the last 168 hours. Cardiac Enzymes: No results for input(s): CKTOTAL, CKMB, CKMBINDEX, TROPONINI in the last 168 hours. BNP (last 3 results) No results for input(s): PROBNP in the last 8760 hours. HbA1C: Recent Labs    02/04/20 0438  HGBA1C 6.4*   CBG: Recent Labs  Lab 02/05/20 1117 02/05/20 1715 02/05/20 2204 02/06/20 0718 02/06/20 1137  GLUCAP 101* 128* 148* 75 85   Lipid Profile: No results for input(s): CHOL, HDL, LDLCALC, TRIG, CHOLHDL, LDLDIRECT in the last 72 hours. Thyroid Function Tests: No results for input(s): TSH, T4TOTAL, FREET4, T3FREE, THYROIDAB in the last 72 hours. Anemia Panel: Recent Labs    02/05/20 0354 02/06/20 0420  FERRITIN 148 118   Sepsis Labs: Recent Labs  Lab 02/03/20 1423 02/03/20 1716 02/06/20 0420  PROCALCITON <0.10  --  <0.10  LATICACIDVEN  --  1.4  --     Recent Results (from the past 240 hour(s))  Respiratory Panel by RT PCR (Flu A&B, Covid) - Nasopharyngeal Swab     Status: Abnormal   Collection Time: 02/03/20  3:06 PM   Specimen:  Nasopharyngeal Swab  Result Value Ref Range Status   SARS Coronavirus 2 by RT PCR POSITIVE (A) NEGATIVE Final    Comment: RESULT CALLED TO, READ BACK BY AND VERIFIED WITH: KIM MAIN RN AT 1700 ON 02/03/20 SNG (NOTE) SARS-CoV-2 target nucleic acids are DETECTED. SARS-CoV-2 RNA is generally detectable in upper respiratory specimens  during the acute phase of infection. Positive results are indicative of the presence of the identified virus, but do not rule  out bacterial infection or co-infection with other pathogens not detected by the test. Clinical correlation with patient history and other diagnostic information is necessary to determine patient infection status. The expected result is Negative. Fact Sheet for Patients:  PinkCheek.be Fact Sheet for Healthcare Providers: GravelBags.it This test is not yet approved or cleared by the [place] FDA and  has been authorized for detection and/or diagnosis of SARS-CoV-2 by FDA under an Emergency Use Authorization (EUA).  This EUA will remain in effect (meaning this test can be used) fo r the duration of  the COVID-19 declaration under Section 564(b)(1) of the Act, 21 U.S.C. section 360bbb-3(b)(1), unless the authorization is terminated or revoked sooner.    Influenza A by PCR NEGATIVE NEGATIVE Final   Influenza B by PCR NEGATIVE NEGATIVE Final    Comment: (NOTE) The Xpert Xpress SARS-CoV-2/FLU/RSV assay is intended as an aid in  the diagnosis of influenza from Nasopharyngeal swab specimens and  should not be used as a sole basis for treatment. Nasal washings and  aspirates are unacceptable for Xpert Xpress SARS-CoV-2/FLU/RSV  testing. Fact Sheet for Patients: PinkCheek.be Fact Sheet for Healthcare Providers: GravelBags.it This test is not yet approved or cleared by the [place] FDA and  has been authorized for  detection and/or diagnosis of SARS-CoV-2 by  FDA under an Emergency Use Authorization (EUA). This EUA will remain  in effect (meaning this test can be used) for the duration of the  Covid-19 declaration under Section 564(b)(1) of the Act, 21  U.S.C. section 360bbb-3(b)(1), unless the authorization is  terminated or revoked. Performed at [facility], [address] [place], [place] [address]   Blood culture (routine x 2)     Status: None (Preliminary result)   Collection Time: 02/03/20  5:58 PM   Specimen: BLOOD  Result Value Ref Range Status   Specimen Description BLOOD RAC  Final   Special Requests   Final    BOTTLES DRAWN AEROBIC AND ANAEROBIC Blood Culture adequate volume   Culture   Final    NO GROWTH 3 DAYS Performed at [facility], [address] [place], [place] [address]    Report Status PENDING  Incomplete  Blood culture (routine x 2)     Status: None (Preliminary result)   Collection Time: 02/03/20  5:59 PM   Specimen: BLOOD  Result Value Ref Range Status   Specimen Description BLOOD RH  Final   Special Requests   Final    BOTTLES DRAWN AEROBIC AND ANAEROBIC Blood Culture adequate volume   Culture   Final    NO GROWTH 3 DAYS Performed at [facility], [address], [place], [place] [address]    Report Status PENDING  Incomplete  MRSA PCR Screening     Status: None   Collection Time: 02/04/20  1:34 PM   Specimen: Nasopharyngeal  Result Value Ref Range Status   MRSA by PCR NEGATIVE NEGATIVE Final    Comment:        The GeneXpert MRSA Assay (FDA approved for NASAL specimens only), is one component of a comprehensive MRSA colonization surveillance program. It is not intended to diagnose MRSA infection nor to guide or monitor treatment for MRSA infections. Performed at [facility], [address] [place], [place] [address]          Radiology Studies: US Venous Img Lower Unilateral Right (DVT)  Result Date:  02/06/2020 CLINICAL DATA:  Right lower extremity pain and edema. History of smoking.  Evaluate for DVT. EXAM: RIGHT LOWER EXTREMITY VENOUS DOPPLER ULTRASOUND TECHNIQUE: Gray-scale sonography with graded compression, as well as color Doppler and duplex ultrasound were performed to evaluate the lower extremity deep venous systems from the level of the common femoral vein and including the common femoral, femoral, profunda femoral, popliteal and calf veins including the posterior tibial, peroneal and gastrocnemius veins when visible. The superficial great saphenous vein was also interrogated. Spectral Doppler was utilized to evaluate flow at rest and with distal augmentation maneuvers in the common femoral, femoral and popliteal veins. COMPARISON:  None. FINDINGS: Contralateral Common Femoral Vein: Respiratory phasicity is normal and symmetric with the symptomatic side. No evidence of thrombus. Normal compressibility. Common Femoral Vein: No evidence of thrombus. Normal compressibility, respiratory phasicity and response to augmentation. Saphenofemoral Junction: No evidence of thrombus. Normal compressibility and flow on color Doppler imaging. Profunda Femoral Vein: No evidence of thrombus. Normal compressibility and flow on color Doppler imaging. Femoral Vein: No evidence of thrombus. Normal compressibility, respiratory phasicity and response to augmentation. Popliteal Vein: No evidence of thrombus. Normal compressibility, respiratory phasicity and response to augmentation. Calf Veins: No evidence of thrombus. Normal compressibility and flow on color Doppler imaging. Superficial Great Saphenous Vein: No evidence of thrombus. Normal compressibility. Venous Reflux:  None. Other Findings:  None. IMPRESSION: No evidence of DVT within the right lower extremity. Electronically Signed   By: Sandi Mariscal M.D.   On: 02/06/2020 10:34   DG Chest Port 1 View  Result Date: 02/06/2020 CLINICAL DATA:  Respiratory distress due to  COVID. EXAM: PORTABLE CHEST 1 VIEW COMPARISON:  Chest x-rays dated 02/03/2020 10/22/2019. FINDINGS: Stable cardiomegaly. The diffuse bilateral patchy alveolar and interstitial airspace opacities are stable in the short-term interval. No pneumothorax is seen. Osseous structures about the chest are unremarkable. IMPRESSION: 1. Stable chest x-ray. Stable diffuse bilateral patchy alveolar and interstitial airspace opacities, compatible with multifocal pneumonia. 2. Stable cardiomegaly. Electronically Signed   By: Franki Cabot M.D.   On: 02/06/2020 11:20        Scheduled Meds: . albuterol  2 puff Inhalation Q6H  . vitamin C  250 mg Oral Daily  . aspirin EC  81 mg Oral Daily  . baclofen  10 mg Oral TID  . Chlorhexidine Gluconate Cloth  6 each Topical Daily  . cholecalciferol  2,000 Units Oral Daily  . dexamethasone (DECADRON) injection  6 mg Intravenous Daily  . digoxin  0.25 mg Oral Daily  . enoxaparin (LOVENOX) injection  40 mg Subcutaneous Q24H  . feeding supplement (ENSURE ENLIVE)  237 mL Oral BID BM  . fluticasone furoate-vilanterol  1 puff Inhalation Daily   And  . umeclidinium bromide  1 puff Inhalation Daily  . furosemide  40 mg Intravenous Once  . gabapentin  400 mg Oral TID  . insulin aspart  0-15 Units Subcutaneous TID WC  . insulin aspart  0-5 Units Subcutaneous QHS  . insulin aspart  3 Units Subcutaneous TID WC  . ivermectin  150 mcg/kg Oral Daily  . pantoprazole (PROTONIX) IV  40 mg Intravenous QHS  . sertraline  200 mg Oral Daily  . torsemide  40 mg Oral BID  . zinc sulfate  220 mg Oral Daily   Continuous Infusions: . remdesivir 100 mg in NS 100 mL 100 mg (02/06/20 1055)     LOS: 3 days    Time spent: 35 min    Sidney Ace, MD [facility] Pager [phone]  If 7PM-7AM, please contact night-coverage 02/06/2020,  2:51 PM

## 2020-02-07 DIAGNOSIS — J1282 Pneumonia due to Coronavirus disease 2019: Secondary | ICD-10-CM | POA: Diagnosis not present

## 2020-02-07 DIAGNOSIS — U071 COVID-19: Secondary | ICD-10-CM | POA: Diagnosis not present

## 2020-02-07 DIAGNOSIS — J9601 Acute respiratory failure with hypoxia: Secondary | ICD-10-CM | POA: Diagnosis not present

## 2020-02-07 LAB — COMPREHENSIVE METABOLIC PANEL
ALT: 17 U/L (ref 0–44)
AST: 19 U/L (ref 15–41)
Albumin: 2.8 g/dL — ABNORMAL LOW (ref 3.5–5.0)
Alkaline Phosphatase: 107 U/L (ref 38–126)
Anion gap: 14 (ref 5–15)
BUN: 15 mg/dL (ref 8–23)
CO2: 39 mmol/L — ABNORMAL HIGH (ref 22–32)
Calcium: 8.5 mg/dL — ABNORMAL LOW (ref 8.9–10.3)
Chloride: 88 mmol/L — ABNORMAL LOW (ref 98–111)
Creatinine, Ser: 0.56 mg/dL (ref 0.44–1.00)
GFR calc Af Amer: >60 mL/min (ref >60)
GFR calc non Af Amer: >60 mL/min (ref >60)
Glucose, Bld: 101 mg/dL — ABNORMAL HIGH (ref 70–99)
Potassium: 3 mmol/L — ABNORMAL LOW (ref 3.5–5.1)
Sodium: 141 mmol/L (ref 135–145)
Total Bilirubin: 0.9 mg/dL (ref 0.3–1.2)
Total Protein: 6.8 g/dL (ref 6.5–8.1)

## 2020-02-07 LAB — CBC WITH DIFFERENTIAL/PLATELET
Abs Immature Granulocytes: 0.08 10*3/uL — ABNORMAL HIGH (ref 0.00–0.07)
Basophils Absolute: 0 10*3/uL (ref 0.0–0.1)
Basophils Relative: 0 %
Eosinophils Absolute: 0 10*3/uL (ref 0.0–0.5)
Eosinophils Relative: 0 %
HCT: 46.3 % — ABNORMAL HIGH (ref 36.0–46.0)
Hemoglobin: 14.1 g/dL (ref 12.0–15.0)
Immature Granulocytes: 1 %
Lymphocytes Relative: 15 %
Lymphs Abs: 1.6 10*3/uL (ref 0.7–4.0)
MCH: 27.4 pg (ref 26.0–34.0)
MCHC: 30.5 g/dL (ref 30.0–36.0)
MCV: 89.9 fL (ref 80.0–100.0)
Monocytes Absolute: 0.7 10*3/uL (ref 0.1–1.0)
Monocytes Relative: 6 %
Neutro Abs: 8.1 10*3/uL — ABNORMAL HIGH (ref 1.7–7.7)
Neutrophils Relative %: 78 %
Platelets: 231 10*3/uL (ref 150–400)
RBC: 5.15 MIL/uL — ABNORMAL HIGH (ref 3.87–5.11)
RDW: 19.1 % — ABNORMAL HIGH (ref 11.5–15.5)
WBC: 10.5 10*3/uL (ref 4.0–10.5)
nRBC: 0 % (ref 0.0–0.2)

## 2020-02-07 LAB — GLUCOSE, CAPILLARY
Glucose-Capillary: 90 mg/dL (ref 70–99)
Glucose-Capillary: 111 mg/dL — ABNORMAL HIGH (ref 70–99)
Glucose-Capillary: 175 mg/dL — ABNORMAL HIGH (ref 70–99)
Glucose-Capillary: 131 mg/dL — ABNORMAL HIGH (ref 70–99)

## 2020-02-07 LAB — FERRITIN: Ferritin: 127 ng/mL (ref 11–307)

## 2020-02-07 LAB — PROCALCITONIN: Procalcitonin: <0.1 ng/mL

## 2020-02-07 LAB — C-REACTIVE PROTEIN: CRP: 5.5 mg/dL — ABNORMAL HIGH (ref <1.0)

## 2020-02-07 LAB — MAGNESIUM: Magnesium: 2.2 mg/dL (ref 1.7–2.4)

## 2020-02-07 LAB — FIBRIN DERIVATIVES D-DIMER (ARMC ONLY): Fibrin derivatives D-dimer (ARMC): 1279.27 ng/mL (FEU) — ABNORMAL HIGH (ref 0.00–499.00)

## 2020-02-07 LAB — PHOSPHORUS: Phosphorus: 3.7 mg/dL (ref 2.5–4.6)

## 2020-02-07 MED ORDER — POTASSIUM CHLORIDE CRYS ER 20 MEQ PO TBCR
40.0000 meq | EXTENDED_RELEASE_TABLET | Freq: Four times a day (QID) | ORAL | Status: AC
  Administered 2020-02-07 (×2): 40 meq via ORAL
  Filled 2020-02-07 (×2): qty 2

## 2020-02-07 MED ORDER — FLUCONAZOLE 50 MG PO TABS
150.0000 mg | ORAL_TABLET | Freq: Once | ORAL | Status: AC
  Administered 2020-02-07: 12:00:00 150 mg via ORAL
  Filled 2020-02-07: qty 1

## 2020-02-07 NOTE — Progress Notes (Signed)
PROGRESS NOTE    Theresa [name]  WLS:[id number] DOB: [birth year] DOA: 02/03/2020 PCP: Margo Common, PA   Brief Narrative:  Theresa [name]  is a 64 y.o. female with a known history of anxiety, CHF, COPD, depression, GERDbeing admitted for COVID pneumonia. She came in for shortness of breath worsening over the last week with productive cough. Patient denies any fevers, states she usually wears 2-1/2 L of oxygen at baseline. She presents with hypoxia and tachypnea. O2 sats in 80s requiring BiPAP. covid is +.  4/21: Seen and examined.  Remains in stepdown status.  Weaned to high flow nasal cannula.  Currently on 15 L high flow.  Mentating well.  No fevers over interval.  4/22: Seen and examined.  Remains in stepdown status.  Remains on 15 L high flow nasal cannula.  Used BiPAP for approximately 5 hours last night.  Mentating well.  Endorsing hunger.  No fevers noted over interval.  4/23: Seen and examined.  On BiPAP of time my evaluation this morning.  Yesterday was able to wean only to 12 L high flow nasal cannula.  Using BiPAP every night.  Mentating clearly.  Voice seems stronger today.  Endorsing hunger and tolerating p.o.  No fevers over interval.   Assessment & Plan:   Active Problems:   Pneumonia due to COVID-19 virus Acute hypoxic resp failure - Present on admission, Due to COVID pneumonia. required BiPAP initially Weaned off to 12-15 liters O2 - maintains sats in 88-92% Plan: - high risk for decompensation, will monitor in SDU for now - appreciate PCCM assistance - Continue decadron, remdesivir (day 5/5), and vitamins  - Continue ivermectin per PCCM Trend inflammatory marker daily Follow cultures Pulmonary hygeine   Hypokalemia Replete & recheck, check Mg  Acute on chronic diastolic CHF - Last echo showed normal EF - given one dose of lasix per PCCM - seems well compensated at this time  Hyperglycemia No h/o DM, likely due to steroids, SSI for now HbA1c  6.4     DVT prophylaxis: Lovenox Code Status: Full Family Communication: None today Disposition Plan:Status is: Inpatient  Remains inpatient appropriate because:Inpatient level of care appropriate due to severity of illness   Dispo: The patient is from: Home              Anticipated d/c is to: SNF              Anticipated d/c date is: > 3 days              Patient currently is not medically stable to d/c.  Persistent severe hypoxic respiratory failure in setting of COVID 19       Consultants:   ICU  Procedures:   BPAP  Antimicrobials:   remdesivir   Subjective: Seen and examined Reports some improvement Weaned from BPAP On HFNC at 15L  Objective: Vitals:   02/07/20 0400 02/07/20 0500 02/07/20 0600 02/07/20 1146  BP: 123/71  129/80   Pulse: 73 67 72 76  Resp: 20 13 (!) 22   Temp:      TempSrc:      SpO2: 96% 96% 96%   Weight:      Height:        Intake/Output Summary (Last 24 hours) at 02/07/2020 1354 Last data filed at 02/07/2020 0500 Gross per 24 hour  Intake --  Output 2250 ml  Net -2250 ml   Filed Weights   02/05/20 0500 02/06/20 0147 02/07/20 0156  Weight: 91.5 kg 96.7  kg 94.1 kg    Examination:  General exam: Appears calm and comfortable  Respiratory system: Scattered bl rhonchi, normal WOB Cardiovascular system: S1 & S2 heard, RRR. No JVD, murmurs, rubs, gallops or clicks. No pedal edema. Gastrointestinal system: Abdomen is nondistended, soft and nontender. No organomegaly or masses felt. Normal bowel sounds heard. Central nervous system: Alert and oriented. No focal neurological deficits. Extremities: Symmetric 5 x 5 power. Skin: No rashes, lesions or ulcers Psychiatry: Judgement and insight appear normal. Mood & affect appropriate.     Data Reviewed: I have personally reviewed following labs and imaging studies  CBC: Recent Labs  Lab 02/03/20 1423 02/04/20 0438 02/05/20 0354 02/06/20 0420 02/07/20 0556  WBC 18.1*   16.9* 10.8* 13.6* 8.1 10.5  NEUTROABS  --  9.5* 11.2* 5.9 8.1*  HGB 13.6  13.8 12.8 12.2 12.3 14.1  HCT 45.4  45.4 41.4 42.2 43.0 46.3*  MCV 91.2  89.9 89.0 93.2 93.9 89.9  PLT 164  172 154 168 203 686   Basic Metabolic Panel: Recent Labs  Lab 02/03/20 1423 02/03/20 1423 02/03/20 2148 02/04/20 0438 02/05/20 0354 02/06/20 0420 02/07/20 0556  NA 138  --   --  140 143 144 141  K 2.8*   < > 2.8* 3.3* 3.2* 3.5 3.0*  CL 95*  --   --  98 96* 94* 88*  CO2 33*  --   --  31 36* 38* 39*  GLUCOSE 143*  --   --  140* 101* 87 101*  BUN 10  --   --  _0 CREATININE 0.54  0.64  --   --  0.54 0.53 0.61 0.56  CALCIUM 8.9  --   --  8.3* 8.7* 8.1* 8.5*  MG  --   --  2.0 2.2 2.6* 2.1 2.2  PHOS  --   --  3.1 3.4 3.5 3.6 3.7   < > = values in this interval not displayed.   GFR: Estimated Creatinine Clearance: 77.9 mL/min (by C-G formula based on SCr of 0.56 mg/dL). Liver Function Tests: Recent Labs  Lab 02/04/20 0438 02/05/20 0354 02/06/20 0420 02/07/20 0556  AST _1 ALT _2 ALKPHOS 98 94 91 107  BILITOT 0.7 0.6 0.5 0.9  PROT 6.7 6.1* 6.2* 6.8  ALBUMIN 2.6* 2.5* 2.5* 2.8*   No results for input(s): LIPASE, AMYLASE in the last 168 hours. No results for input(s): AMMONIA in the last 168 hours. Coagulation Profile: No results for input(s): INR, PROTIME in the last 168 hours. Cardiac Enzymes: No results for input(s): CKTOTAL, CKMB, CKMBINDEX, TROPONINI in the last 168 hours. BNP (last 3 results) No results for input(s): PROBNP in the last 8760 hours. HbA1C: No results for input(s): HGBA1C in the last 72 hours. CBG: Recent Labs  Lab 02/06/20 1137 02/06/20 1642 02/06/20 2137 02/07/20 0751 02/07/20 1144  GLUCAP 85 105* 107* 90 111*   Lipid Profile: No results for input(s): CHOL, HDL, LDLCALC, TRIG, CHOLHDL, LDLDIRECT in the last 72 hours. Thyroid Function Tests: No results for input(s): TSH, T4TOTAL, FREET4, T3FREE, THYROIDAB in the last 72  hours. Anemia Panel: Recent Labs    02/06/20 0420 02/07/20 0556  FERRITIN 118 127   Sepsis Labs: Recent Labs  Lab 02/03/20 1423 02/03/20 1716 02/06/20 0420 02/07/20 0556  PROCALCITON <0.10  --  <0.10 <0.10  LATICACIDVEN  --  1.4  --   --     Recent Results (from the  past 240 hour(s))  Respiratory Panel by RT PCR (Flu A&B, Covid) - Nasopharyngeal Swab     Status: Abnormal   Collection Time: 02/03/20  3:06 PM   Specimen: Nasopharyngeal Swab  Result Value Ref Range Status   SARS Coronavirus 2 by RT PCR POSITIVE (A) NEGATIVE Final    Comment: RESULT CALLED TO, READ BACK BY AND VERIFIED WITH: KIM MAIN RN AT 1700 ON 02/03/20 SNG (NOTE) SARS-CoV-2 target nucleic acids are DETECTED. SARS-CoV-2 RNA is generally detectable in upper respiratory specimens  during the acute phase of infection. Positive results are indicative of the presence of the identified virus, but do not rule out bacterial infection or co-infection with other pathogens not detected by the test. Clinical correlation with patient history and other diagnostic information is necessary to determine patient infection status. The expected result is Negative. Fact Sheet for Patients:  PinkCheek.be Fact Sheet for Healthcare Providers: GravelBags.it This test is not yet approved or cleared by the [place] FDA and  has been authorized for detection and/or diagnosis of SARS-CoV-2 by FDA under an Emergency Use Authorization (EUA).  This EUA will remain in effect (meaning this test can be used) fo r the duration of  the COVID-19 declaration under Section 564(b)(1) of the Act, 21 U.S.C. section 360bbb-3(b)(1), unless the authorization is terminated or revoked sooner.    Influenza A by PCR NEGATIVE NEGATIVE Final   Influenza B by PCR NEGATIVE NEGATIVE Final    Comment: (NOTE) The Xpert Xpress SARS-CoV-2/FLU/RSV assay is intended as an aid in  the diagnosis of  influenza from Nasopharyngeal swab specimens and  should not be used as a sole basis for treatment. Nasal washings and  aspirates are unacceptable for Xpert Xpress SARS-CoV-2/FLU/RSV  testing. Fact Sheet for Patients: PinkCheek.be Fact Sheet for Healthcare Providers: GravelBags.it This test is not yet approved or cleared by the [place] FDA and  has been authorized for detection and/or diagnosis of SARS-CoV-2 by  FDA under an Emergency Use Authorization (EUA). This EUA will remain  in effect (meaning this test can be used) for the duration of the  Covid-19 declaration under Section 564(b)(1) of the Act, 21  U.S.C. section 360bbb-3(b)(1), unless the authorization is  terminated or revoked. Performed at [facility], [address] [place], [place] [address]   Blood culture (routine x 2)     Status: None (Preliminary result)   Collection Time: 02/03/20  5:58 PM   Specimen: BLOOD  Result Value Ref Range Status   Specimen Description BLOOD RAC  Final   Special Requests   Final    BOTTLES DRAWN AEROBIC AND ANAEROBIC Blood Culture adequate volume   Culture   Final    NO GROWTH 4 DAYS Performed at [facility], [address] [place], [place] [address]    Report Status PENDING  Incomplete  Blood culture (routine x 2)     Status: None (Preliminary result)   Collection Time: 02/03/20  5:59 PM   Specimen: BLOOD  Result Value Ref Range Status   Specimen Description BLOOD RH  Final   Special Requests   Final    BOTTLES DRAWN AEROBIC AND ANAEROBIC Blood Culture adequate volume   Culture   Final    NO GROWTH 4 DAYS Performed at [facility], [address], [place], [place] [address]    Report Status PENDING  Incomplete  MRSA PCR Screening     Status: None   Collection Time: 02/04/20  1:34 PM  Specimen: Nasopharyngeal  Result Value Ref Range Status   MRSA by PCR NEGATIVE NEGATIVE Final     Comment:        The GeneXpert MRSA Assay (FDA approved for NASAL specimens only), is one component of a comprehensive MRSA colonization surveillance program. It is not intended to diagnose MRSA infection nor to guide or monitor treatment for MRSA infections. Performed at [facility], [address], [place], [place] [address]          Radiology Studies: US Venous Img Lower Unilateral Right (DVT)  Result Date: 02/06/2020 CLINICAL DATA:  Right lower extremity pain and edema. History of smoking. Evaluate for DVT. EXAM: RIGHT LOWER EXTREMITY VENOUS DOPPLER ULTRASOUND TECHNIQUE: Gray-scale sonography with graded compression, as well as color Doppler and duplex ultrasound were performed to evaluate the lower extremity deep venous systems from the level of the common femoral vein and including the common femoral, femoral, profunda femoral, popliteal and calf veins including the posterior tibial, peroneal and gastrocnemius veins when visible. The superficial great saphenous vein was also interrogated. Spectral Doppler was utilized to evaluate flow at rest and with distal augmentation maneuvers in the common femoral, femoral and popliteal veins. COMPARISON:  None. FINDINGS: Contralateral Common Femoral Vein: Respiratory phasicity is normal and symmetric with the symptomatic side. No evidence of thrombus. Normal compressibility. Common Femoral Vein: No evidence of thrombus. Normal compressibility, respiratory phasicity and response to augmentation. Saphenofemoral Junction: No evidence of thrombus. Normal compressibility and flow on color Doppler imaging. Profunda Femoral Vein: No evidence of thrombus. Normal compressibility and flow on color Doppler imaging. Femoral Vein: No evidence of thrombus. Normal compressibility, respiratory phasicity and response to augmentation. Popliteal Vein: No evidence of thrombus. Normal compressibility, respiratory phasicity and response to augmentation. Calf  Veins: No evidence of thrombus. Normal compressibility and flow on color Doppler imaging. Superficial Great Saphenous Vein: No evidence of thrombus. Normal compressibility. Venous Reflux:  None. Other Findings:  None. IMPRESSION: No evidence of DVT within the right lower extremity. Electronically Signed   By: Sandi Mariscal M.D.   On: 02/06/2020 10:34   DG Chest Port 1 View  Result Date: 02/06/2020 CLINICAL DATA:  Respiratory distress due to COVID. EXAM: PORTABLE CHEST 1 VIEW COMPARISON:  Chest x-rays dated 02/03/2020 10/22/2019. FINDINGS: Stable cardiomegaly. The diffuse bilateral patchy alveolar and interstitial airspace opacities are stable in the short-term interval. No pneumothorax is seen. Osseous structures about the chest are unremarkable. IMPRESSION: 1. Stable chest x-ray. Stable diffuse bilateral patchy alveolar and interstitial airspace opacities, compatible with multifocal pneumonia. 2. Stable cardiomegaly. Electronically Signed   By: Franki Cabot M.D.   On: 02/06/2020 11:20   DG HIP UNILAT WITH PELVIS 2-3 VIEWS RIGHT  Result Date: 02/06/2020 CLINICAL DATA:  Right hip and inguinal pain EXAM: DG HIP (WITH OR WITHOUT PELVIS) 2-3V RIGHT COMPARISON:  12/29/2015 FINDINGS: Frontal view of the pelvis as well as frontal and frogleg lateral views of the right hip are obtained. No acute displaced fracture. Alignment is anatomic. Joint spaces are relatively well preserved. There is prominent spondylosis at the lumbosacral junction. Asymmetric sclerosis of the left sacroiliac joint. IMPRESSION: 1. No acute displaced fracture. 2. Prominent lower lumbar spondylosis. 3. Asymmetric left-sided sacroiliitis. Electronically Signed   By: Randa Ngo M.D.   On: 02/06/2020 17:11        Scheduled Meds: . albuterol  2 puff Inhalation Q6H  . vitamin C  250 mg Oral Daily  . aspirin EC  81 mg Oral Daily  .  baclofen  10 mg Oral TID  . Chlorhexidine Gluconate Cloth  6 each Topical Daily  . cholecalciferol   2,000 Units Oral Daily  . dexamethasone (DECADRON) injection  6 mg Intravenous Daily  . digoxin  0.25 mg Oral Daily  . enoxaparin (LOVENOX) injection  40 mg Subcutaneous Q24H  . feeding supplement (ENSURE ENLIVE)  237 mL Oral BID BM  . fluticasone furoate-vilanterol  1 puff Inhalation Daily   And  . umeclidinium bromide  1 puff Inhalation Daily  . furosemide  40 mg Intravenous Once  . gabapentin  400 mg Oral TID  . insulin aspart  0-15 Units Subcutaneous TID WC  . insulin aspart  0-5 Units Subcutaneous QHS  . insulin aspart  3 Units Subcutaneous TID WC  . ivermectin  150 mcg/kg Oral Daily  . pantoprazole (PROTONIX) IV  40 mg Intravenous QHS  . potassium chloride  40 mEq Oral Q6H  . sertraline  200 mg Oral Daily  . torsemide  40 mg Oral BID  . zinc sulfate  220 mg Oral Daily   Continuous Infusions:    LOS: 4 days    Time spent: 35 min    Sidney Ace, MD [facility] Pager [phone]  If 7PM-7AM, please contact night-coverage 02/07/2020, 1:54 PM

## 2020-02-07 NOTE — Progress Notes (Signed)
Name: Theresa [name] MRN: [id number] DOB: 04-10-1956    ADMISSION DATE:  02/03/2020 CONSULTATION DATE: 02/03/2020  REFERRING MD : Dr. Manuella Ghazi   CHIEF COMPLAINT: Shortness of Breath   BRIEF PATIENT DESCRIPTION: 64 yo female admitted with acute on chronic hypoxic respiratory failure COVID-19 pneumonia and AECOPD requiring Bipap    EVENTS:  4/22 -patient is able to self prone , she seems clinically improved.  4/23- no events overnight , patient is improved, continue current care. Patient alternating from HFNC to BIPAP  HISTORY OF PRESENT ILLNESS:  This is a 64 yo female with a PMH of Tobacco Abuse, Obesity, GERD, DDD, COPD (Bipap qhs), CHF, and Anxiety. She presented to [facility] ER on 04/19 with c/o shortness of breath, cough, nausea, vomiting, and diarrhea onset of symptoms a few days prior to presentation although n/v/d has since resolved.  In the ER CXR concerning for multifocal pneumonia and/or possible pulmonary edema.  Lab results revealed K+ 2.8, glucose 143, BNP 190, LDH 361, troponin 25, d-dimer 3,739.85, fibrinogen 707, and wbc 16.8.  Influenza PCR negative, however COVID-19 positive.  Pt received 40 mg iv lasix, 125 mg iv solumedrol x1 dose, duonebs x3, remdesivir, vancomycin, and zosyn.  Due to continued tachypnea and hypoxia pt placed on Bipap.  She was subsequently admitted to the stepdown unit per hospitalist team PCCM consulted to assist with management.   PAST MEDICAL HISTORY :   has a past medical history of Acute postoperative pain (10/03/2017), Anxiety, BiPAP (biphasic positive airway pressure) dependence, Carpal tunnel syndrome, CHF (congestive heart failure) ([place]), CHF (congestive heart failure) ([place]), Chronic generalized abdominal pain, Chronic rhinitis, COPD (chronic obstructive pulmonary disease) ([place]), DDD (degenerative disc disease), cervical, DDD (degenerative disc disease), lumbosacral, Depression, Dyspnea, Edema, Flu, Gastritis, GERD (gastroesophageal reflux disease),  Hematuria, Hernia of abdominal wall (07/17/2018), Hernia, abdominal, Hypertension, Kidney stones, Low back pain, Lumbar radiculitis, Malodorous urine, Muscle weakness, Obesity, Oxygen dependent, Renal cyst, Sensory urge incontinence, Thyroid activity decreased, Tobacco abuse, and Wheezing.  has a past surgical history that includes Abdominal hysterectomy; Tonsillectomy; Carpal tunnel release (Left, 2012); Tubal ligation; epigastric hernia repair (N/A, 08/13/2018); Umbilical hernia repair (N/A, 08/13/2018); and RIGHT/LEFT HEART CATH AND CORONARY ANGIOGRAPHY (N/A, 07/11/2019). Prior to Admission medications   Medication Sig Start Date End Date Taking? Authorizing Provider  albuterol (VENTOLIN HFA) 108 (90 Base) MCG/ACT inhaler Inhale 2 puffs into the lungs every 6 (six) hours as needed for wheezing or shortness of breath. 10/23/19   Rudene Re, MD  aspirin EC 81 MG tablet Take 81 mg by mouth daily.    [provider]  baclofen (LIORESAL) 10 MG tablet Take 1 tablet (10 mg total) by mouth 3 (three) times daily. 01/13/20 07/11/20  Milinda Pointer, MD  bisoprolol-hydrochlorothiazide [facility]) 10-6.25 MG tablet TAKE 1 TABLET BY MOUTH DAILY Patient not taking: Reported on 01/22/2020 06/27/19   Chrismon, Vickki Muff, PA  Cholecalciferol ([facility] D3) 2000 units CAPS Take 2,000 Units by mouth daily.     [provider]  digoxin (LANOXIN) 0.25 MG tablet TAKE 1 TABLET(0.25 MG) BY MOUTH DAILY 11/17/19   Chrismon, Vickki Muff, PA  Fluticasone-Umeclidin-Vilant 100-62.5-25 MCG/INH AEPB Inhale 1 puff into the lungs daily. 10/03/18   [provider]  gabapentin (NEURONTIN) 400 MG capsule Take 1 capsule (400 mg total) by mouth 3 (three) times daily. 01/13/20 07/11/20  Milinda Pointer, MD  HYDROcodone-acetaminophen (NORCO/VICODIN) 5-325 MG tablet Take 1 tablet by mouth every 8 (eight) hours as needed for severe pain. Must last 30 days 01/13/20 02/12/20  Milinda Pointer, MD  HYDROcodone-acetaminophen  (NORCO/VICODIN) 5-325 MG tablet Take 1 tablet by mouth every 8 (eight) hours as needed for severe pain. Must last 30 days Patient not taking: Reported on 01/22/2020 02/12/20 03/13/20  Milinda Pointer, MD  HYDROcodone-acetaminophen (NORCO/VICODIN) 5-325 MG tablet Take 1 tablet by mouth every 8 (eight) hours as needed for severe pain. Must last 30 days Patient not taking: Reported on 01/22/2020 03/13/20 04/12/20  Milinda Pointer, MD  ipratropium-albuterol (DUONEB) 0.5-2.5 (3) MG/3ML SOLN USE 1 VIAL VIA NEBULIZER EVERY 6 HOURS AS NEEDED FOR SHORTNESS OF BREATH OR WHEEZING 10/14/19   Chrismon, Vickki Muff, PA  levothyroxine (SYNTHROID) 25 MCG tablet TAKE 1 TABLET(25 MCG) BY MOUTH DAILY BEFORE BREAKFAST Patient not taking: Reported on 01/22/2020 12/27/19   Chrismon, Vickki Muff, PA  potassium chloride SA (KLOR-CON) 20 MEQ tablet TAKE 1 TABLET(20 MEQ) BY MOUTH TWICE DAILY 09/15/19   Darylene Price A, FNP  sertraline (ZOLOFT) 100 MG tablet TAKE 2 TABLETS BY MOUTH EVERY MORNING 11/24/19   Chrismon, Vickki Muff, PA  torsemide (DEMADEX) 20 MG tablet TAKE 2 TABLETS(40 MG) BY MOUTH TWICE DAILY Patient taking differently: Take 40 mg by mouth 2 (two) times daily.  11/24/19   [name], FNP   Allergies  Allergen Reactions  . Codeine Nausea And Vomiting  . Meloxicam Other (See Comments)    Stomach pain    FAMILY HISTORY:  family history includes Alcohol abuse in her father; Breast cancer in her sister; Cancer in her brother, brother, and sister; Coronary artery disease in her mother; Heart disease in her father and mother; Lung cancer in her sister; Pneumonia in her brother; Stroke in her mother. SOCIAL HISTORY:  reports that she has been smoking cigarettes. She has a 22.00 pack-year smoking history. She has never used smokeless tobacco. She reports that she does not drink alcohol or use drugs.  REVIEW OF SYSTEMS: Positives in BOLD  Constitutional: Negative for fever, chills, weight loss, malaise/fatigue and  diaphoresis.  HENT: Negative for hearing loss, ear pain, nosebleeds, congestion, sore throat, neck pain, tinnitus and ear discharge.   Eyes: Negative for blurred vision, double vision, photophobia, pain, discharge and redness.  Respiratory: cough, hemoptysis, sputum production, shortness of breath, wheezing and stridor.   Cardiovascular: Negative for chest pain, palpitations, orthopnea, claudication, leg swelling and PND.  Gastrointestinal: Negative for heartburn, nausea, vomiting, abdominal pain, diarrhea, constipation, blood in stool and melena.  Genitourinary: Negative for dysuria, urgency, frequency, hematuria and flank pain.  Musculoskeletal: left flank pain, myalgias, back pain, joint pain and falls.  Skin: Negative for itching and rash.  Neurological: Negative for dizziness, tingling, tremors, sensory change, speech change, focal weakness, seizures, loss of consciousness, weakness and headaches.  Endo/Heme/Allergies: Negative for environmental allergies and polydipsia. Does not bruise/bleed easily.  SUBJECTIVE:  No complaints at this time states "I am hungry"  VITAL SIGNS: Temp:  [98.5 F (36.9 C)-99.3 F (37.4 C)] 99.3 F (37.4 C) (04/23 0156) Pulse Rate:  [67-82] 76 (04/23 1146) Resp:  [13-26] 22 (04/23 0600) BP: (101-129)/(64-80) 129/80 (04/23 0600) SpO2:  [91 %-97 %] 93 % (04/23 1434) FiO2 (%):  [55 %-80 %] 80 % (04/23 1434) Weight:  [94.1 kg] 94.1 kg (04/23 0156)  PHYSICAL EXAMINATION: General: acutely ill appearing female, NAD on Bipap  Neuro: alert and oriented, follows commands, PERRL  HEENT: supple, no JVD  Cardiovascular: nsr, no R/G  Lungs: crackles and inspiratory wheezes throughout, even, slightly tachypneic  Abdomen: +BS x4, obese, soft, non distended, non tender Musculoskeletal:  normal bulk and tone, no edema  Skin: intact no rashes or lesions present   Recent Labs  Lab 02/05/20 0354 02/06/20 0420 02/07/20 0556  NA 143 144 141  K 3.2* 3.5 3.0*  CL 96*  94* 88*  CO2 36* 38* 39*  BUN _0 CREATININE 0.53 0.61 0.56  GLUCOSE 101* 87 101*   Recent Labs  Lab 02/05/20 0354 02/06/20 0420 02/07/20 0556  HGB 12.2 12.3 14.1  HCT 42.2 43.0 46.3*  WBC 13.6* 8.1 10.5  PLT 168 203 231   US Venous Img Lower Unilateral Right (DVT)  Result Date: 02/06/2020 CLINICAL DATA:  Right lower extremity pain and edema. History of smoking. Evaluate for DVT. EXAM: RIGHT LOWER EXTREMITY VENOUS DOPPLER ULTRASOUND TECHNIQUE: Gray-scale sonography with graded compression, as well as color Doppler and duplex ultrasound were performed to evaluate the lower extremity deep venous systems from the level of the common femoral vein and including the common femoral, femoral, profunda femoral, popliteal and calf veins including the posterior tibial, peroneal and gastrocnemius veins when visible. The superficial great saphenous vein was also interrogated. Spectral Doppler was utilized to evaluate flow at rest and with distal augmentation maneuvers in the common femoral, femoral and popliteal veins. COMPARISON:  None. FINDINGS: Contralateral Common Femoral Vein: Respiratory phasicity is normal and symmetric with the symptomatic side. No evidence of thrombus. Normal compressibility. Common Femoral Vein: No evidence of thrombus. Normal compressibility, respiratory phasicity and response to augmentation. Saphenofemoral Junction: No evidence of thrombus. Normal compressibility and flow on color Doppler imaging. Profunda Femoral Vein: No evidence of thrombus. Normal compressibility and flow on color Doppler imaging. Femoral Vein: No evidence of thrombus. Normal compressibility, respiratory phasicity and response to augmentation. Popliteal Vein: No evidence of thrombus. Normal compressibility, respiratory phasicity and response to augmentation. Calf Veins: No evidence of thrombus. Normal compressibility and flow on color Doppler imaging. Superficial Great Saphenous Vein: No evidence of  thrombus. Normal compressibility. Venous Reflux:  None. Other Findings:  None. IMPRESSION: No evidence of DVT within the right lower extremity. Electronically Signed   By: Sandi Mariscal M.D.   On: 02/06/2020 10:34   DG Chest Port 1 View  Result Date: 02/06/2020 CLINICAL DATA:  Respiratory distress due to COVID. EXAM: PORTABLE CHEST 1 VIEW COMPARISON:  Chest x-rays dated 02/03/2020 10/22/2019. FINDINGS: Stable cardiomegaly. The diffuse bilateral patchy alveolar and interstitial airspace opacities are stable in the short-term interval. No pneumothorax is seen. Osseous structures about the chest are unremarkable. IMPRESSION: 1. Stable chest x-ray. Stable diffuse bilateral patchy alveolar and interstitial airspace opacities, compatible with multifocal pneumonia. 2. Stable cardiomegaly. Electronically Signed   By: Franki Cabot M.D.   On: 02/06/2020 11:20   DG HIP UNILAT WITH PELVIS 2-3 VIEWS RIGHT  Result Date: 02/06/2020 CLINICAL DATA:  Right hip and inguinal pain EXAM: DG HIP (WITH OR WITHOUT PELVIS) 2-3V RIGHT COMPARISON:  12/29/2015 FINDINGS: Frontal view of the pelvis as well as frontal and frogleg lateral views of the right hip are obtained. No acute displaced fracture. Alignment is anatomic. Joint spaces are relatively well preserved. There is prominent spondylosis at the lumbosacral junction. Asymmetric sclerosis of the left sacroiliac joint. IMPRESSION: 1. No acute displaced fracture. 2. Prominent lower lumbar spondylosis. 3. Asymmetric left-sided sacroiliitis. Electronically Signed   By: Randa Ngo M.D.   On: 02/06/2020 17:11    ASSESSMENT / PLAN:  Acute on chronic hypoxic respiratory failure secondary to COVID-19  - mild pulmonary edema, and AECOPD  Hx: Current everyday smoker  and Bipap qhs  Prn Bipap or supplemental O2 for dyspnea and/or hypoxia  Scheduled and prn bronchodilator therapy Continue decadron, remdesivir, and vitamins  -Ivermectin Trend inflammatory marker and PCT  Follow  cultures If PCT positive will continue abx therapy  Pulmonary hygiene and OOB to chair once respiratory status stabilized  IV lasix x1 dose: trend BMP, replace electrolytes as indicated, monitor UOP Smoking cessation counseling provided Maintain airborne and contact precautions   Best Practice: VTE px: subq lovenox SUP px: iv protonix  Diet: heart healthy diet   Critical care provider statement:    Critical care time (minutes):  33   Critical care time was exclusive of:  Separately billable procedures and  treating other patients   Critical care was necessary to treat or prevent imminent or  life-threatening deterioration of the following conditions:  acute hypoxemic respiratory failure due to Protection care was time spent personally by me on the following  activities:  Development of treatment plan with patient or surrogate,  discussions with consultants, evaluation of patient's response to  treatment, examination of patient, obtaining history from patient or  surrogate, ordering and performing treatments and interventions, ordering  and review of laboratory studies and re-evaluation of patient's condition   I assumed direction of critical care for this patient from another  provider in my specialty: no

## 2020-02-07 NOTE — TOC Progression Note (Signed)
Transition of Care [facility]) - Progression Note    Patient Details  Name: Theresa [name] MRN: [id number] Date of Birth: Aug 22, 1956  Transition of [facility] CM/SW Ferguson, LCSW Phone Number: 02/07/2020, 12:04 PM  Clinical Narrative:   PT and OT currently recommend Home Health services at discharge. Patient is still on airborne precautions. CSW attempted call to patient, no answer. CSW called and spoke with patient's significant other, [name]. He reported he would like to talk to patient before deciding if they would be agreeable to [place]. CSW attempted call to patient in room again. Spoke with patient who reported she does not feel like she needs Home Health. She reported she will let us know if she changes her mind.    Expected Discharge Plan: Home/Self Care Barriers to Discharge: Continued Medical Work up  Expected Discharge Plan and Services Expected Discharge Plan: Home/Self Care       Living arrangements for the past 2 months: Single Family Home                                       Social Determinants of Health (SDOH) Interventions    Readmission Risk Interventions Readmission Risk Prevention Plan 02/05/2020 07/13/2019  Transportation Screening Complete Complete  PCP or Specialist Appt within 5-7 Days - Complete  PCP or Specialist Appt within 3-5 Days Complete -  Home Care Screening - Complete  Medication Review (RN CM) - Complete  HRI or Home Care Consult Complete -  Medication Review (RN Care Manager) Complete -  Some recent data might be hidden

## 2020-02-07 NOTE — Evaluation (Signed)
Physical Therapy Evaluation Patient Details Name: Theresa [name] MRN: [id number] DOB: August 08, 1956 Today's Date: 02/07/2020   History of Present Illness  Theresa [name] is a 64 yo female with a PMH of Tobacco Abuse, Obesity, GERD, DDD, COPD (Bipap qhs), CHF, and Anxiety. She presented to [facility] ER on 04/19 with c/o shortness of breath, cough, nausea, vomiting, and diarrhea onset of symptoms a few days prior to presentation although n/v/d has since resolved. She presents with hypoxia and tachypnea. covid is +.   Clinical Impression  Pt was somewhat tired from having worked with OT earlier, but ultimately was willing to do some activity with PT and with only minimal cuing showed good effort.  She was on 15L t/o the effort with no labored breathing/shortness of breath but need for brief sitting/standing rest breaks between tasks or activities.  There were a few moments where O2 dropped into the mid/low 80s but those generally seemed to step more from hand grip likely causing pulseox to read poorly, again she generally seemed to stay 90% +/- 2 a vast majority of the time.  Pt is not at her baseline and still very cardiovascularly limited and O2 reliant (does use 2.5L at baseline) and will benefit from further PT.    Follow Up Recommendations Home health PT;Supervision/Assistance - 24 hour    Equipment Recommendations  None recommended by PT(has 2 and 4WW )    Recommendations for Other Services       Precautions / Restrictions Precautions Precautions: Fall(reports she has had dozens of falls in the last 6 months) Restrictions Weight Bearing Restrictions: No      Mobility  Bed Mobility Overal bed mobility: Needs Assistance Bed Mobility: Supine to Sit;Sit to Supine     Supine to sit: HOB elevated;Min guard Sit to supine: Min assist   General bed mobility comments: Pt was able to slowly, but w/o direct assist get to EOB, however despite much struggled ultimately needed light assist to get LEs  back into bed  Transfers Overall transfer level: Needs assistance Equipment used: Rolling walker (2 wheeled) Transfers: Sit to/from Stand Sit to Stand: Min guard Stand pivot transfers: From elevated surface;Min guard       General transfer comment: Pt was able to rise to standing without direct assist, showed good confidence despite some nausea and fatigue.  Ambulation/Gait Ambulation/Gait assistance: Supervision;Min guard Gait Distance (Feet): 50 Feet Assistive device: Rolling walker (2 wheeled)       General Gait Details: Short bouts of ambulation (with walker and 15L O2) with no LOBs or buckling, did need brief standing rest breaks t/o the effort but had no overt safety or LOB issues  Stairs            Wheelchair Mobility    Modified Rankin (Stroke Patients Only)       Balance Overall balance assessment: Needs assistance Sitting-balance support: Bilateral upper extremity supported;Feet supported Sitting balance-Leahy Scale: Good     Standing balance support: Bilateral upper extremity supported Standing balance-Leahy Scale: Fair Standing balance comment: Pt with no overt buckling/LOBs, guarded and cautious w/o heavy reliance on the walker                             Pertinent Vitals/Pain Pain Assessment: 0-10 Pain Score: 7  Pain Location: R hip/side Pain Descriptors / Indicators: Constant;Grimacing Pain Intervention(s): Limited activity within patient's tolerance    Home Living Family/patient expects to be discharged to:: Private residence Living  Arrangements: Spouse/significant other Available Help at Discharge: Family;Available 24 hours/day Type of Home: Mobile home Home Access: Stairs to enter Entrance Stairs-Rails: Left Entrance Stairs-Number of Steps: 3 Home Layout: One level Home Equipment: Walker - 4 wheels;Walker - 2 wheels Additional Comments: Small daschund dog and husband at home    Prior Function Level of Independence:  Independent with assistive device(s)         Comments: Pt reports husbands does grocery shoppping and she does not get out of house much 2/2 covid. Sits in shower to bathe and uses 2.5 L [place] at baseline     Hand Dominance   Dominant Hand: Right    Extremity/Trunk Assessment   Upper Extremity Assessment Upper Extremity Assessment: Defer to OT evaluation RUE Deficits / Details: AROM WFL grossly. Grip 4-/5 LUE Deficits / Details: shoulder flexion AROM (~90*) PROM (~160*), c/o pain c >120* L shoulder flexion    Lower Extremity Assessment Lower Extremity Assessment: Generalized weakness(grossly = b/l with reported neuropathy but functional)       Communication   Communication: No difficulties  Cognition Arousal/Alertness: Awake/alert Behavior During Therapy: WFL for tasks assessed/performed;Flat affect Overall Cognitive Status: Within Functional Limits for tasks assessed                                        General Comments General comments (skin integrity, edema, etc.): on 15L O2 t/o the session, generally 91-87% with very brief (potentially just pulse ox variations) descents into the mid 80s    Exercises Other Exercises Other Exercises: Pt educated re: OT role, pain management, energy conservation, falls prevention, importance of participation in mobility for improved functional strengthening Other Exercises: LBD, self-feeding/drinking, bed mobility, sup<>sit, sit<>stand, SPT, setup for grooming (tooth brushing and comb), PLB, sitting balance/tolerance   Assessment/Plan    PT Assessment Patient needs continued PT services  PT Problem List Decreased strength;Decreased range of motion;Decreased activity tolerance;Decreased balance;Decreased mobility;Decreased coordination;Decreased knowledge of use of DME;Decreased safety awareness;Cardiopulmonary status limiting activity;Pain       PT Treatment Interventions DME instruction;Gait training;Stair  training;Functional mobility training;Therapeutic activities;Therapeutic exercise;Balance training;Neuromuscular re-education;Patient/family education    PT Goals (Current goals can be found in the Care Plan section)  Acute Rehab PT Goals Patient Stated Goal: to return to PLOF PT Goal Formulation: With patient Time For Goal Achievement: 02/21/20 Potential to Achieve Goals: Good    Frequency Min 2X/week   Barriers to discharge        Co-evaluation               AM-PAC PT "6 Clicks" Mobility  Outcome Measure Help needed turning from your back to your side while in a flat bed without using bedrails?: None Help needed moving from lying on your back to sitting on the side of a flat bed without using bedrails?: A Little Help needed moving to and from a bed to a chair (including a wheelchair)?: A Little Help needed standing up from a chair using your arms (e.g., wheelchair or bedside chair)?: None Help needed to walk in hospital room?: A Little Help needed climbing 3-5 steps with a railing? : A Lot 6 Click Score: 19    End of Session Equipment Utilized During Treatment: Gait belt Activity Tolerance: Patient limited by fatigue;Patient tolerated treatment well Patient left: in bed;with call bell/phone within reach Nurse Communication: Mobility status PT Visit Diagnosis: Muscle weakness (generalized) (M62.81);Difficulty in walking,  not elsewhere classified (R26.2);Unsteadiness on feet (R26.81);Repeated falls (R29.6)    Time: 7494-4967 PT Time Calculation (min) (ACUTE ONLY): 36 min   Charges:   PT Evaluation $PT Eval Moderate Complexity: 1 Mod PT Treatments $Gait Training: 8-22 mins        Kreg Shropshire, DPT 02/07/2020, 11:17 AM

## 2020-02-07 NOTE — Evaluation (Signed)
Occupational Therapy Evaluation Patient Details Name: Theresa [name] MRN: [id number] DOB: Mar 29, 1956 Today's Date: 02/07/2020    History of Present Illness Theresa [name] is a 63 yo female with a PMH of Tobacco Abuse, Obesity, GERD, DDD, COPD (Bipap qhs), CHF, and Anxiety. She presented to [facility] ER on 04/19 with c/o shortness of breath, cough, nausea, vomiting, and diarrhea onset of symptoms a few days prior to presentation although n/v/d has since resolved. She presents with hypoxia and tachypnea. covid is +.    Clinical Impression   Theresa [name] was seen for OT evaluation this date. Prior to hospital admission, pt was MOD I for ADLs on 2.5L [place] at baseline. Pt lives c husband in mobile home. Pt presents to acute OT demonstrating impaired ADL performance and functional mobility 2/2 decreased activity tolerance, functional strength/ROM/balance deficits, and pain. Pt currently requires SETUP for self-feeding/drinking and grooming tasks. MAX A don socks at bed level. MIN A for bed mobility and SPT to move higher in bed c SpO2 desat from 91% on 15L [place] to 87%, resolved c seated rest breaks. Pt tolerated sitting EOB for ~3 mins CGA and BUE support, pt unable to engage in functional seated ADL tasks in sitting 2/2 nausea (RN notified). Pt would benefit from skilled OT to address noted impairments and functional limitations (see below for any additional details) in order to maximize safety and independence while minimizing falls risk and caregiver burden. Upon hospital discharge, recommend STR to maximize pt safety and return to PLOF.     Follow Up Recommendations  SNF    Equipment Recommendations  (TBD at next venue of care)    Recommendations for Other Services       Precautions / Restrictions Restrictions Weight Bearing Restrictions: No      Mobility Bed Mobility Overal bed mobility: Needs Assistance Bed Mobility: Supine to Sit;Sit to Supine     Supine to sit: Min assist;HOB elevated(rest  breaks - increased time) Sit to supine: Min assist   General bed mobility comments: SpO2 desat 87% on 15L [place] during sup>sit, resolved c seated rest break and PLB  Transfers Overall transfer level: Needs assistance Equipment used: None Transfers: Sit to/from [facility] Sit to Stand: Min assist;From elevated surface Stand pivot transfers: From elevated surface;Min guard            Balance Overall balance assessment: Needs assistance Sitting-balance support: Bilateral upper extremity supported;Feet supported Sitting balance-Leahy Scale: Fair     Standing balance support: Single extremity supported Standing balance-Leahy Scale: Fair                             ADL either performed or assessed with clinical judgement   ADL Overall ADL's : Needs assistance/impaired                                       General ADL Comments: SETUP self-drinking/feeding long sitting in bed. MOD I doff socks at bed level (using toes to unhook from heel and slide off), MAX A don B socks at bed level.      Vision         Perception     Praxis      Pertinent Vitals/Pain Pain Assessment: 0-10 Pain Score: 9  Pain Location: R inguinal/hip Pain Descriptors / Indicators: Constant;Grimacing Pain Intervention(s): Limited activity within patient's tolerance;Repositioned;Patient requesting pain  meds-RN notified     Hand Dominance Right   Extremity/Trunk Assessment Upper Extremity Assessment Upper Extremity Assessment: Generalized weakness;LUE deficits/detail;RUE deficits/detail RUE Deficits / Details: AROM WFL grossly. Grip 4-/5 LUE Deficits / Details: shoulder flexion AROM (~90*) PROM (~160*), c/o pain c >120* L shoulder flexion   Lower Extremity Assessment Lower Extremity Assessment: Generalized weakness       Communication Communication Communication: No difficulties   Cognition Arousal/Alertness: Awake/alert Behavior During Therapy: WFL  for tasks assessed/performed;Flat affect Overall Cognitive Status: Within Functional Limits for tasks assessed                                     General Comments  Long sitting in bed at start of session: SpO2 91 on 15L [place], HR 81, RR 22, BP 120/66, MAP 84. Desat to 87% on 15L [place] sitting EOB, resolved to 93% c seated rest breaks and PLB    Exercises Exercises: Other exercises Other Exercises Other Exercises: Pt educated re: OT role, pain management, energy conservation, falls prevention, importance of participation in mobility for improved functional strengthening Other Exercises: LBD, self-feeding/drinking, bed mobility, sup<>sit, sit<>stand, SPT, setup for grooming (tooth brushing and comb), PLB, sitting balance/tolerance   Shoulder Instructions      Home Living Family/patient expects to be discharged to:: Private residence Living Arrangements: Spouse/significant other Available Help at Discharge: Family;Available 24 hours/day Type of Home: Mobile home Home Access: Stairs to enter Entrance Stairs-Number of Steps: 3 Entrance Stairs-Rails: Left Home Layout: One level     Bathroom Shower/Tub: Occupational psychologist: Standard     Home Equipment: Environmental consultant - 4 wheels   Additional Comments: Small daschund dog and husband at home      Prior Functioning/Environment Level of Independence: Independent with assistive device(s)        Comments: Pt reports husbands does grocery shoppping and she does not get out of house much 2/2 covid. Sits in shower to bathe and uses 2.5 L [place] at baseline        OT Problem List: Decreased strength;Decreased range of motion;Decreased activity tolerance;Impaired balance (sitting and/or standing);Decreased knowledge of use of DME or AE      OT Treatment/Interventions: Self-care/ADL training;Therapeutic exercise;Energy conservation;DME and/or AE instruction;Therapeutic activities;Patient/family education;Balance training     OT Goals(Current goals can be found in the care plan section) Acute Rehab OT Goals Patient Stated Goal: to return to PLOF OT Goal Formulation: With patient Time For Goal Achievement: 02/21/20 Potential to Achieve Goals: Good ADL Goals Pt Will Perform Grooming: sitting;with set-up;with supervision(c no cues for rest breaks) Pt Will Perform Lower Body Dressing: bed level;with set-up;with min assist(c AE PRN) Pt Will Transfer to Toilet: with min guard assist;stand pivot transfer;bedside commode(c LRAD PRN) Additional ADL Goal #1: Pt will Independently verbalize plan to implement x3 falls prevention strategies.  OT Frequency: Min 2X/week   Barriers to D/C: Inaccessible home environment          Co-evaluation              AM-PAC OT "6 Clicks" Daily Activity     Outcome Measure Help from another person eating meals?: A Little Help from another person taking care of personal grooming?: A Little Help from another person toileting, which includes using toliet, bedpan, or urinal?: A Lot Help from another person bathing (including washing, rinsing, drying)?: A Lot Help from another person to put on and taking  off regular upper body clothing?: A Little Help from another person to put on and taking off regular lower body clothing?: A Lot 6 Click Score: 15   End of Session Equipment Utilized During Treatment: Oxygen(15L humidified [place]) Nurse Communication: Mobility status;Patient requests pain meds  Activity Tolerance: Patient tolerated treatment well;Patient limited by pain Patient left: in bed;with call bell/phone within reach  OT Visit Diagnosis: Unsteadiness on feet (R26.81);Other abnormalities of gait and mobility (R26.89)                Time: 5488-3014 OT Time Calculation (min): 35 min Charges:  OT General Charges $OT Visit: 1 Visit OT Evaluation $OT Eval Moderate Complexity: 1 Mod OT Treatments $Self Care/Home Management : 23-37 mins  Dessie Coma, M.S. OTR/L  02/07/20,  10:30 AM

## 2020-02-07 NOTE — Consult Note (Signed)
PHARMACY CONSULT NOTE - FOLLOW UP  Pharmacy Consult for Electrolyte Monitoring and Replacement   Recent Labs: Potassium (mmol/L)  Date Value  02/07/2020 3.0 (L)  02/02/2015 3.9   Magnesium (mg/dL)  Date Value  02/07/2020 2.2   Calcium (mg/dL)  Date Value  02/07/2020 8.5 (L)   Calcium, Total (mg/dL)  Date Value  02/02/2015 8.7 (L)   Albumin (g/dL)  Date Value  02/07/2020 2.8 (L)  10/30/2019 4.1  06/12/2014 3.7   Phosphorus (mg/dL)  Date Value  02/07/2020 3.7   Sodium (mmol/L)  Date Value  02/07/2020 141  10/30/2019 141  02/02/2015 141     Assessment: Patient is a 64 y/o F admitted with respiratory failure secondary to COVID-19 pneumonia and AECOPD requiring HFNC. Pharmacy has been consulted for electrolyte monitoring and replacement.   Patient hypokalemic to with K of 3 today which was replaced with PO potassium 40 mEq x 2.  Patient is on torsemide 40 mg BID ; UOP good. Last charted BM 4/16.   Patient is on digoxin 0.25 mg daily. Will target K of 4 and Mg of 2.    Goal of Therapy:  Potassium of 4 Magnesium of 2 Phosphorous within normal limits  Plan:  -No further supplementation indicated today. Will re-assess in the morning -BMP, Mg, Phos ordered for tomorrow morning  [place] Resident 02/07/2020 7:25 PM

## 2020-02-08 DIAGNOSIS — U071 COVID-19: Secondary | ICD-10-CM | POA: Diagnosis not present

## 2020-02-08 DIAGNOSIS — J1282 Pneumonia due to Coronavirus disease 2019: Secondary | ICD-10-CM | POA: Diagnosis not present

## 2020-02-08 DIAGNOSIS — J9601 Acute respiratory failure with hypoxia: Secondary | ICD-10-CM | POA: Diagnosis not present

## 2020-02-08 LAB — CULTURE, BLOOD (ROUTINE X 2)
Culture: NO GROWTH
Culture: NO GROWTH
Special Requests: ADEQUATE
Special Requests: ADEQUATE

## 2020-02-08 LAB — COMPREHENSIVE METABOLIC PANEL
ALT: 15 U/L (ref 0–44)
AST: 17 U/L (ref 15–41)
Albumin: 2.8 g/dL — ABNORMAL LOW (ref 3.5–5.0)
Alkaline Phosphatase: 96 U/L (ref 38–126)
Anion gap: 12 (ref 5–15)
BUN: 16 mg/dL (ref 8–23)
CO2: 36 mmol/L — ABNORMAL HIGH (ref 22–32)
Calcium: 8.6 mg/dL — ABNORMAL LOW (ref 8.9–10.3)
Chloride: 89 mmol/L — ABNORMAL LOW (ref 98–111)
Creatinine, Ser: 0.7 mg/dL (ref 0.44–1.00)
GFR calc Af Amer: >60 mL/min (ref >60)
GFR calc non Af Amer: >60 mL/min (ref >60)
Glucose, Bld: 119 mg/dL — ABNORMAL HIGH (ref 70–99)
Potassium: 4.1 mmol/L (ref 3.5–5.1)
Sodium: 137 mmol/L (ref 135–145)
Total Bilirubin: 0.7 mg/dL (ref 0.3–1.2)
Total Protein: 6.5 g/dL (ref 6.5–8.1)

## 2020-02-08 LAB — CBC WITH DIFFERENTIAL/PLATELET
Abs Immature Granulocytes: 0.06 10*3/uL (ref 0.00–0.07)
Basophils Absolute: 0 10*3/uL (ref 0.0–0.1)
Basophils Relative: 0 %
Eosinophils Absolute: 0 10*3/uL (ref 0.0–0.5)
Eosinophils Relative: 0 %
HCT: 46.9 % — ABNORMAL HIGH (ref 36.0–46.0)
Hemoglobin: 13.7 g/dL (ref 12.0–15.0)
Immature Granulocytes: 1 %
Lymphocytes Relative: 10 %
Lymphs Abs: 1.2 10*3/uL (ref 0.7–4.0)
MCH: 26.9 pg (ref 26.0–34.0)
MCHC: 29.2 g/dL — ABNORMAL LOW (ref 30.0–36.0)
MCV: 92 fL (ref 80.0–100.0)
Monocytes Absolute: 0.7 10*3/uL (ref 0.1–1.0)
Monocytes Relative: 6 %
Neutro Abs: 10.1 10*3/uL — ABNORMAL HIGH (ref 1.7–7.7)
Neutrophils Relative %: 83 %
Platelets: 266 10*3/uL (ref 150–400)
RBC: 5.1 MIL/uL (ref 3.87–5.11)
RDW: 18.7 % — ABNORMAL HIGH (ref 11.5–15.5)
WBC: 12.1 10*3/uL — ABNORMAL HIGH (ref 4.0–10.5)
nRBC: 0 % (ref 0.0–0.2)

## 2020-02-08 LAB — PHOSPHORUS: Phosphorus: 3.3 mg/dL (ref 2.5–4.6)

## 2020-02-08 LAB — GLUCOSE, CAPILLARY
Glucose-Capillary: 116 mg/dL — ABNORMAL HIGH (ref 70–99)
Glucose-Capillary: 125 mg/dL — ABNORMAL HIGH (ref 70–99)
Glucose-Capillary: 188 mg/dL — ABNORMAL HIGH (ref 70–99)
Glucose-Capillary: 171 mg/dL — ABNORMAL HIGH (ref 70–99)

## 2020-02-08 LAB — PROCALCITONIN: Procalcitonin: <0.1 ng/mL

## 2020-02-08 LAB — FERRITIN: Ferritin: 127 ng/mL (ref 11–307)

## 2020-02-08 LAB — C-REACTIVE PROTEIN: CRP: 4.2 mg/dL — ABNORMAL HIGH (ref <1.0)

## 2020-02-08 LAB — FIBRIN DERIVATIVES D-DIMER (ARMC ONLY): Fibrin derivatives D-dimer (ARMC): 864.69 ng/mL (FEU) — ABNORMAL HIGH (ref 0.00–499.00)

## 2020-02-08 LAB — MAGNESIUM: Magnesium: 2.3 mg/dL (ref 1.7–2.4)

## 2020-02-08 MED ORDER — LORAZEPAM 0.5 MG PO TABS
0.5000 mg | ORAL_TABLET | Freq: Three times a day (TID) | ORAL | Status: DC | PRN
  Administered 2020-02-08 – 2020-02-10 (×3): 0.5 mg via ORAL
  Filled 2020-02-08 (×3): qty 1

## 2020-02-08 NOTE — Consult Note (Signed)
PHARMACY CONSULT NOTE - FOLLOW UP  Pharmacy Consult for Electrolyte Monitoring and Replacement   Recent Labs: Potassium (mmol/L)  Date Value  02/08/2020 4.1  02/02/2015 3.9   Magnesium (mg/dL)  Date Value  02/08/2020 2.3   Calcium (mg/dL)  Date Value  02/08/2020 8.6 (L)   Calcium, Total (mg/dL)  Date Value  02/02/2015 8.7 (L)   Albumin (g/dL)  Date Value  02/08/2020 2.8 (L)  10/30/2019 4.1  06/12/2014 3.7   Phosphorus (mg/dL)  Date Value  02/08/2020 3.3   Sodium (mmol/L)  Date Value  02/08/2020 137  10/30/2019 141  02/02/2015 141     Assessment: Patient is a 64 y/o F admitted with respiratory failure secondary to COVID-19 pneumonia and AECOPD requiring HFNC. Pharmacy has been consulted for electrolyte monitoring and replacement.   Patient is on torsemide 40 mg BID ; UOP good. Last charted BM 4/16.   Patient is on digoxin 0.25 mg daily. Will target K of 4 and Mg of 2.    Goal of Therapy:  Potassium of 4 Magnesium of 2 Phosphorous within normal limits  Plan:  K 4.1  Mag 2.3  Phos 3.3 Scr 0.70 -No further supplementation indicated today. Will re-assess in w/ am labs  Chinita Greenland PharmD Clinical Pharmacist 02/08/2020

## 2020-02-08 NOTE — Progress Notes (Signed)
PROGRESS NOTE    Theresa [name]  ZOX:[id number] DOB: 1955/12/20 DOA: 02/03/2020 PCP: Margo Common, PA   Brief Narrative:  Theresa [name]  is a 64 y.o. female with a known history of anxiety, CHF, COPD, depression, GERDbeing admitted for COVID pneumonia. She came in for shortness of breath worsening over the last week with productive cough. Patient denies any fevers, states she usually wears 2-1/2 L of oxygen at baseline. She presents with hypoxia and tachypnea. O2 sats in 80s requiring BiPAP. covid is +.  4/21: Seen and examined.  Remains in stepdown status.  Weaned to high flow nasal cannula.  Currently on 15 L high flow.  Mentating well.  No fevers over interval.  4/22: Seen and examined.  Remains in stepdown status.  Remains on 15 L high flow nasal cannula.  Used BiPAP for approximately 5 hours last night.  Mentating well.  Endorsing hunger.  No fevers noted over interval.  4/23: Seen and examined.  On BiPAP of time my evaluation this morning.  Yesterday was able to wean only to 12 L high flow nasal cannula.  Using BiPAP every night.  Mentating clearly.  Voice seems stronger today.  Endorsing hunger and tolerating p.o.  No fevers over interval.  4/24: Patient seen and examined.  Weaned to 8 to 10 L nasal cannula.  Clinically improving.  BiPAP nightly.  Mentating clearly.  No fevers noted over interval.  Stable for transfer to 1C.   Assessment & Plan:   Active Problems:   Pneumonia due to COVID-19 virus Acute hypoxic resp failure - Present on admission, Due to COVID pneumonia. required BiPAP initially Weaned off to 12-15 liters O2 - maintains sats in 88-92% Completed 5-day course of remdesivir Plan: Transfer to unit 1C Continue Decadron to complete 10-day course Continue ivermectin Continue supplemental vitamins Trend inflammatory markers Pulmonary hygiene Wean oxygen as tolerated Continue nightly BiPAP Trend inflammatory marker daily Follow cultures Pulmonary  hygeine  Hypokalemia Replete & recheck, check Mg Pharmacy consulted for electrolyte monitoring and replacement  Acute on chronic diastolic CHF - Last echo showed normal EF - given one dose of lasix per PCCM - seems well compensated at this time  Hyperglycemia No h/o DM, likely due to steroids, SSI for now HbA1c 6.4     DVT prophylaxis: Lovenox Code Status: Full Family Communication: None today Disposition Plan:Status is: Inpatient  Remains inpatient appropriate because:Inpatient level of care appropriate due to severity of illness   Dispo: The patient is from: Home              Anticipated d/c is to: SNF              Anticipated d/c date is: > 3 days              Patient currently is not medically stable to d/c.  Persistent severe hypoxic respiratory failure in setting of COVID 19       Consultants:   ICU  Procedures:   BPAP  Antimicrobials:   Remdesivir, completed course   Subjective: Seen and examined Clinically improving On 8 L high flow nasal cannula Transferred to 1C  Objective: Vitals:   02/07/20 2100 02/08/20 0500 02/08/20 0800 02/08/20 1000  BP:   127/70 110/68  Pulse:   89 76  Resp:   (!) 28   Temp: 98.5 F (36.9 C)     TempSrc: Oral     SpO2:   90% 91%  Weight:  93.9 kg    Height:  Intake/Output Summary (Last 24 hours) at 02/08/2020 1148 Last data filed at 02/08/2020 0800 Gross per 24 hour  Intake 100 ml  Output 1550 ml  Net -1450 ml   Filed Weights   02/06/20 0147 02/07/20 0156 02/08/20 0500  Weight: 96.7 kg 94.1 kg 93.9 kg    Examination:  General exam: Appears calm and comfortable  Respiratory system: Scattered bl rhonchi, normal WOB Cardiovascular system: S1 & S2 heard, RRR. No JVD, murmurs, rubs, gallops or clicks. No pedal edema. Gastrointestinal system: Abdomen is nondistended, soft and nontender. No organomegaly or masses felt. Normal bowel sounds heard. Central nervous system: Alert and oriented. No  focal neurological deficits. Extremities: Symmetric 5 x 5 power. Skin: No rashes, lesions or ulcers Psychiatry: Judgement and insight appear normal. Mood & affect appropriate.     Data Reviewed: I have personally reviewed following labs and imaging studies  CBC: Recent Labs  Lab 02/04/20 0438 02/05/20 0354 02/06/20 0420 02/07/20 0556 02/08/20 0457  WBC 10.8* 13.6* 8.1 10.5 12.1*  NEUTROABS 9.5* 11.2* 5.9 8.1* 10.1*  HGB 12.8 12.2 12.3 14.1 13.7  HCT 41.4 42.2 43.0 46.3* 46.9*  MCV 89.0 93.2 93.9 89.9 92.0  PLT 154 168 203 231 354   Basic Metabolic Panel: Recent Labs  Lab 02/04/20 0438 02/05/20 0354 02/06/20 0420 02/07/20 0556 02/08/20 0457  NA 140 143 144 141 137  K 3.3* 3.2* 3.5 3.0* 4.1  CL 98 96* 94* 88* 89*  CO2 31 36* 38* 39* 36*  GLUCOSE 140* 101* 87 101* 119*  BUN _0 CREATININE 0.54 0.53 0.61 0.56 0.70  CALCIUM 8.3* 8.7* 8.1* 8.5* 8.6*  MG 2.2 2.6* 2.1 2.2 2.3  PHOS 3.4 3.5 3.6 3.7 3.3   GFR: Estimated Creatinine Clearance: 77.8 mL/min (by C-G formula based on SCr of 0.7 mg/dL). Liver Function Tests: Recent Labs  Lab 02/04/20 0438 02/05/20 0354 02/06/20 0420 02/07/20 0556 02/08/20 0457  AST _1 ALT _2 ALKPHOS 98 94 91 107 96  BILITOT 0.7 0.6 0.5 0.9 0.7  PROT 6.7 6.1* 6.2* 6.8 6.5  ALBUMIN 2.6* 2.5* 2.5* 2.8* 2.8*   No results for input(s): LIPASE, AMYLASE in the last 168 hours. No results for input(s): AMMONIA in the last 168 hours. Coagulation Profile: No results for input(s): INR, PROTIME in the last 168 hours. Cardiac Enzymes: No results for input(s): CKTOTAL, CKMB, CKMBINDEX, TROPONINI in the last 168 hours. BNP (last 3 results) No results for input(s): PROBNP in the last 8760 hours. HbA1C: No results for input(s): HGBA1C in the last 72 hours. CBG: Recent Labs  Lab 02/07/20 0751 02/07/20 1144 02/07/20 1613 02/07/20 2109 02/08/20 0808  GLUCAP 90 111* 175* 131* 116*   Lipid Profile: No  results for input(s): CHOL, HDL, LDLCALC, TRIG, CHOLHDL, LDLDIRECT in the last 72 hours. Thyroid Function Tests: No results for input(s): TSH, T4TOTAL, FREET4, T3FREE, THYROIDAB in the last 72 hours. Anemia Panel: Recent Labs    02/07/20 0556 02/08/20 0457  FERRITIN 127 127   Sepsis Labs: Recent Labs  Lab 02/03/20 1423 02/03/20 1716 02/06/20 0420 02/07/20 0556 02/08/20 0457  PROCALCITON <0.10  --  <0.10 <0.10 <0.10  LATICACIDVEN  --  1.4  --   --   --     Recent Results (from the past 240 hour(s))  Respiratory Panel by RT PCR (Flu A&B, Covid) - Nasopharyngeal Swab     Status: Abnormal   Collection Time: 02/03/20  3:06 PM   Specimen: Nasopharyngeal Swab  Result Value Ref Range Status   SARS Coronavirus 2 by RT PCR POSITIVE (A) NEGATIVE Final    Comment: RESULT CALLED TO, READ BACK BY AND VERIFIED WITH: KIM MAIN RN AT 1700 ON 02/03/20 SNG (NOTE) SARS-CoV-2 target nucleic acids are DETECTED. SARS-CoV-2 RNA is generally detectable in upper respiratory specimens  during the acute phase of infection. Positive results are indicative of the presence of the identified virus, but do not rule out bacterial infection or co-infection with other pathogens not detected by the test. Clinical correlation with patient history and other diagnostic information is necessary to determine patient infection status. The expected result is Negative. Fact Sheet for Patients:  PinkCheek.be Fact Sheet for Healthcare Providers: GravelBags.it This test is not yet approved or cleared by the [place] FDA and  has been authorized for detection and/or diagnosis of SARS-CoV-2 by FDA under an Emergency Use Authorization (EUA).  This EUA will remain in effect (meaning this test can be used) fo r the duration of  the COVID-19 declaration under Section 564(b)(1) of the Act, 21 U.S.C. section 360bbb-3(b)(1), unless the authorization is terminated  or revoked sooner.    Influenza A by PCR NEGATIVE NEGATIVE Final   Influenza B by PCR NEGATIVE NEGATIVE Final    Comment: (NOTE) The Xpert Xpress SARS-CoV-2/FLU/RSV assay is intended as an aid in  the diagnosis of influenza from Nasopharyngeal swab specimens and  should not be used as a sole basis for treatment. Nasal washings and  aspirates are unacceptable for Xpert Xpress SARS-CoV-2/FLU/RSV  testing. Fact Sheet for Patients: PinkCheek.be Fact Sheet for Healthcare Providers: GravelBags.it This test is not yet approved or cleared by the [place] FDA and  has been authorized for detection and/or diagnosis of SARS-CoV-2 by  FDA under an Emergency Use Authorization (EUA). This EUA will remain  in effect (meaning this test can be used) for the duration of the  Covid-19 declaration under Section 564(b)(1) of the Act, 21  U.S.C. section 360bbb-3(b)(1), unless the authorization is  terminated or revoked. Performed at [facility], [address] [place], [place] [address]   Blood culture (routine x 2)     Status: None   Collection Time: 02/03/20  5:58 PM   Specimen: BLOOD  Result Value Ref Range Status   Specimen Description BLOOD RAC  Final   Special Requests   Final    BOTTLES DRAWN AEROBIC AND ANAEROBIC Blood Culture adequate volume   Culture   Final    NO GROWTH 5 DAYS Performed at [facility], [address], [place], [place] [address]    Report Status 02/08/2020 FINAL  Final  Blood culture (routine x 2)     Status: None   Collection Time: 02/03/20  5:59 PM   Specimen: BLOOD  Result Value Ref Range Status   Specimen Description BLOOD RH  Final   Special Requests   Final    BOTTLES DRAWN AEROBIC AND ANAEROBIC Blood Culture adequate volume   Culture   Final    NO GROWTH 5 DAYS Performed at [facility], [address], [place], [place] [address]    Report Status 02/08/2020 FINAL   Final  MRSA PCR Screening     Status: None   Collection Time: 02/04/20  1:34 PM   Specimen: Nasopharyngeal  Result Value Ref Range Status   MRSA by PCR NEGATIVE NEGATIVE Final    Comment:        The  GeneXpert MRSA Assay (FDA approved for NASAL specimens only), is one component of a comprehensive MRSA colonization surveillance program. It is not intended to diagnose MRSA infection nor to guide or monitor treatment for MRSA infections. Performed at [facility], [address] [place], [place] [address]          Radiology Studies: DG HIP UNILAT WITH PELVIS 2-3 VIEWS RIGHT  Result Date: 02/06/2020 CLINICAL DATA:  Right hip and inguinal pain EXAM: DG HIP (WITH OR WITHOUT PELVIS) 2-3V RIGHT COMPARISON:  12/29/2015 FINDINGS: Frontal view of the pelvis as well as frontal and frogleg lateral views of the right hip are obtained. No acute displaced fracture. Alignment is anatomic. Joint spaces are relatively well preserved. There is prominent spondylosis at the lumbosacral junction. Asymmetric sclerosis of the left sacroiliac joint. IMPRESSION: 1. No acute displaced fracture. 2. Prominent lower lumbar spondylosis. 3. Asymmetric left-sided sacroiliitis. Electronically Signed   By: Randa Ngo M.D.   On: 02/06/2020 17:11        Scheduled Meds: . albuterol  2 puff Inhalation Q6H  . vitamin C  250 mg Oral Daily  . aspirin EC  81 mg Oral Daily  . baclofen  10 mg Oral TID  . Chlorhexidine Gluconate Cloth  6 each Topical Daily  . cholecalciferol  2,000 Units Oral Daily  . dexamethasone (DECADRON) injection  6 mg Intravenous Daily  . digoxin  0.25 mg Oral Daily  . enoxaparin (LOVENOX) injection  40 mg Subcutaneous Q24H  . feeding supplement (ENSURE ENLIVE)  237 mL Oral BID BM  . fluticasone furoate-vilanterol  1 puff Inhalation Daily   And  . umeclidinium bromide  1 puff Inhalation Daily  . furosemide  40 mg Intravenous Once  . gabapentin  400 mg Oral TID  . insulin  aspart  0-15 Units Subcutaneous TID WC  . insulin aspart  0-5 Units Subcutaneous QHS  . insulin aspart  3 Units Subcutaneous TID WC  . ivermectin  150 mcg/kg Oral Daily  . pantoprazole (PROTONIX) IV  40 mg Intravenous QHS  . sertraline  200 mg Oral Daily  . torsemide  40 mg Oral BID  . zinc sulfate  220 mg Oral Daily   Continuous Infusions:    LOS: 5 days    Time spent: 35 min    Sidney Ace, MD [facility] Pager [phone]  If 7PM-7AM, please contact night-coverage 02/08/2020, 11:48 AM

## 2020-02-09 ENCOUNTER — Inpatient Hospital Stay: Payer: Medicare HMO

## 2020-02-09 DIAGNOSIS — U071 COVID-19: Secondary | ICD-10-CM | POA: Diagnosis not present

## 2020-02-09 DIAGNOSIS — J9601 Acute respiratory failure with hypoxia: Secondary | ICD-10-CM | POA: Diagnosis not present

## 2020-02-09 DIAGNOSIS — J1282 Pneumonia due to Coronavirus disease 2019: Secondary | ICD-10-CM | POA: Diagnosis not present

## 2020-02-09 LAB — BASIC METABOLIC PANEL
Anion gap: 13 (ref 5–15)
BUN: 22 mg/dL (ref 8–23)
CO2: 37 mmol/L — ABNORMAL HIGH (ref 22–32)
Calcium: 8.9 mg/dL (ref 8.9–10.3)
Chloride: 89 mmol/L — ABNORMAL LOW (ref 98–111)
Creatinine, Ser: 0.73 mg/dL (ref 0.44–1.00)
GFR calc Af Amer: >60 mL/min (ref >60)
GFR calc non Af Amer: >60 mL/min (ref >60)
Glucose, Bld: 104 mg/dL — ABNORMAL HIGH (ref 70–99)
Potassium: 3.9 mmol/L (ref 3.5–5.1)
Sodium: 139 mmol/L (ref 135–145)

## 2020-02-09 LAB — GLUCOSE, CAPILLARY
Glucose-Capillary: 86 mg/dL (ref 70–99)
Glucose-Capillary: 149 mg/dL — ABNORMAL HIGH (ref 70–99)
Glucose-Capillary: 159 mg/dL — ABNORMAL HIGH (ref 70–99)
Glucose-Capillary: 139 mg/dL — ABNORMAL HIGH (ref 70–99)

## 2020-02-09 MED ORDER — LIDOCAINE 5 % EX PTCH
1.0000 | MEDICATED_PATCH | CUTANEOUS | Status: DC
  Administered 2020-02-09 – 2020-02-11 (×3): 1 via TRANSDERMAL
  Filled 2020-02-09 (×3): qty 1

## 2020-02-09 MED ORDER — BISACODYL 10 MG RE SUPP
10.0000 mg | Freq: Every day | RECTAL | Status: DC | PRN
  Administered 2020-02-10: 16:00:00 10 mg via RECTAL
  Filled 2020-02-09: qty 1

## 2020-02-09 MED ORDER — POLYETHYLENE GLYCOL 3350 17 G PO PACK
17.0000 g | PACK | Freq: Every day | ORAL | Status: DC
  Administered 2020-02-09 – 2020-02-11 (×3): 17 g via ORAL
  Filled 2020-02-09 (×3): qty 1

## 2020-02-09 MED ORDER — POTASSIUM CHLORIDE CRYS ER 20 MEQ PO TBCR
20.0000 meq | EXTENDED_RELEASE_TABLET | Freq: Once | ORAL | Status: AC
  Administered 2020-02-09: 11:00:00 20 meq via ORAL
  Filled 2020-02-09: qty 1

## 2020-02-09 MED ORDER — SENNOSIDES-DOCUSATE SODIUM 8.6-50 MG PO TABS
1.0000 | ORAL_TABLET | Freq: Two times a day (BID) | ORAL | Status: DC
  Administered 2020-02-09 – 2020-02-11 (×4): 1 via ORAL
  Filled 2020-02-09 (×4): qty 1

## 2020-02-09 MED ORDER — VALACYCLOVIR HCL 500 MG PO TABS
1000.0000 mg | ORAL_TABLET | Freq: Three times a day (TID) | ORAL | Status: DC
  Administered 2020-02-09 – 2020-02-11 (×7): 1000 mg via ORAL
  Filled 2020-02-09 (×9): qty 2

## 2020-02-09 NOTE — Consult Note (Signed)
PHARMACY CONSULT NOTE - FOLLOW UP  Pharmacy Consult for Electrolyte Monitoring and Replacement   Recent Labs: Potassium (mmol/L)  Date Value  02/09/2020 3.9  02/02/2015 3.9   Magnesium (mg/dL)  Date Value  02/08/2020 2.3   Calcium (mg/dL)  Date Value  02/09/2020 8.9   Calcium, Total (mg/dL)  Date Value  02/02/2015 8.7 (L)   Albumin (g/dL)  Date Value  02/08/2020 2.8 (L)  10/30/2019 4.1  06/12/2014 3.7   Phosphorus (mg/dL)  Date Value  02/08/2020 3.3   Sodium (mmol/L)  Date Value  02/09/2020 139  10/30/2019 141  02/02/2015 141     Assessment: Patient is a 65 y/o F admitted with respiratory failure secondary to COVID-19 pneumonia and AECOPD requiring HFNC. Pharmacy has been consulted for electrolyte monitoring and replacement.   Patient is on torsemide 40 mg BID ; UOP good. Last charted BM 4/16.   Patient is on digoxin 0.25 mg daily. Will target K of 4 and Mg of 2.    Goal of Therapy:  Potassium of 4 Magnesium of 2 Phosphorous within normal limits  Plan:  K 3.9   -Will order KCL PO 20 meq x 1 since on torsemide. Will re-assess in w/ am labs  Chinita Greenland PharmD Clinical Pharmacist 02/09/2020

## 2020-02-09 NOTE — Progress Notes (Signed)
PROGRESS NOTE    Theresa [name]  HBZ:[id number] DOB: [birth year] DOA: 02/03/2020 PCP: Margo Common, PA   Brief Narrative:  Theresa [name]  is a 64 y.o. female with a known history of anxiety, CHF, COPD, depression, GERDbeing admitted for COVID pneumonia. She came in for shortness of breath worsening over the last week with productive cough. Patient denies any fevers, states she usually wears 2-1/2 L of oxygen at baseline. She presents with hypoxia and tachypnea. O2 sats in 80s requiring BiPAP. covid is +.  4/21: Seen and examined.  Remains in stepdown status.  Weaned to high flow nasal cannula.  Currently on 15 L high flow.  Mentating well.  No fevers over interval.  4/22: Seen and examined.  Remains in stepdown status.  Remains on 15 L high flow nasal cannula.  Used BiPAP for approximately 5 hours last night.  Mentating well.  Endorsing hunger.  No fevers noted over interval.  4/23: Seen and examined.  On BiPAP of time my evaluation this morning.  Yesterday was able to wean only to 12 L high flow nasal cannula.  Using BiPAP every night.  Mentating clearly.  Voice seems stronger today.  Endorsing hunger and tolerating p.o.  No fevers over interval.  4/24: Patient seen and examined.  Weaned to 8 to 10 L nasal cannula.  Clinically improving.  BiPAP nightly.  Mentating clearly.  No fevers noted over interval.  Stable for transfer to 1C.  4/25: Patient seen and examined.  Wean to 8 L nasal cannula.  Clinically appears improved.  Voice getting stronger.  Nightly NIPPV.  Mentating clearly.  No fevers noted.   Assessment & Plan:   Active Problems:   Pneumonia due to COVID-19 virus Acute hypoxic resp failure - Present on admission, Due to COVID pneumonia. required BiPAP initially Weaned off to 8L liters O2 - maintains sats in 88-92% Completed 5-day course of remdesivir Plan: Continue Decadron to complete 10-day course Stop ivermectin Continue supplemental vitamins Wean oxygen as  tolerated Continue nightly NIPPV Trend inflammatory marker  Follow cultures Pulmonary hygeine  Hypokalemia Replete & recheck, check Mg Pharmacy consulted for electrolyte monitoring and replacement  Acute on chronic diastolic CHF - Last echo showed normal EF - given one dose of lasix per PCCM - seems well compensated at this time  Hyperglycemia No h/o DM, likely due to steroids, SSI for now HbA1c 6.4   DVT prophylaxis: Lovenox Code Status: Full Family Communication: None today Disposition Plan:Status is: Inpatient  Remains inpatient appropriate because:Inpatient level of care appropriate due to severity of illness   Dispo: The patient is from: Home              Anticipated d/c is to: SNF              Anticipated d/c date is: > 3 days              Patient currently is not medically stable to d/c.  Persistent severe hypoxic respiratory failure in setting of COVID 19   Consultants:   ICU  Procedures:   BPAP  Antimicrobials:   Remdesivir, completed course   Subjective: Seen and examined Clinically improving On 8 L high flow nasal cannula   Objective: Vitals:   02/09/20 0033 02/09/20 0103 02/09/20 0500 02/09/20 0855  BP: (!) 108/57   (!) 106/48  Pulse: 76   67  Resp:    20  Temp:    97.8 F (36.6 C)  TempSrc:    Oral  SpO2:  94%  (!) 88%  Weight:   93 kg   Height:        Intake/Output Summary (Last 24 hours) at 02/09/2020 1049 Last data filed at 02/09/2020 0910 Gross per 24 hour  Intake --  Output 1700 ml  Net -1700 ml   Filed Weights   02/07/20 0156 02/08/20 0500 02/09/20 0500  Weight: 94.1 kg 93.9 kg 93 kg    Examination:  General exam: Appears calm and comfortable  Respiratory system: Scattered bl rhonchi, normal WOB Cardiovascular system: S1 & S2 heard, RRR. No JVD, murmurs, rubs, gallops or clicks. No pedal edema. Gastrointestinal system: Abdomen is nondistended, soft and nontender. No organomegaly or masses felt. Normal bowel  sounds heard. Central nervous system: Alert and oriented. No focal neurological deficits. Extremities: Symmetric 5 x 5 power. Skin: No rashes, lesions or ulcers Psychiatry: Judgement and insight appear normal. Mood & affect appropriate.     Data Reviewed: I have personally reviewed following labs and imaging studies  CBC: Recent Labs  Lab 02/04/20 0438 02/05/20 0354 02/06/20 0420 02/07/20 0556 02/08/20 0457  WBC 10.8* 13.6* 8.1 10.5 12.1*  NEUTROABS 9.5* 11.2* 5.9 8.1* 10.1*  HGB 12.8 12.2 12.3 14.1 13.7  HCT 41.4 42.2 43.0 46.3* 46.9*  MCV 89.0 93.2 93.9 89.9 92.0  PLT 154 168 203 231 947   Basic Metabolic Panel: Recent Labs  Lab 02/04/20 0438 02/04/20 0438 02/05/20 0354 02/06/20 0420 02/07/20 0556 02/08/20 0457 02/09/20 0552  NA 140   < > 143 144 141 137 139  K 3.3*   < > 3.2* 3.5 3.0* 4.1 3.9  CL 98   < > 96* 94* 88* 89* 89*  CO2 31   < > 36* 38* 39* 36* 37*  GLUCOSE 140*   < > 101* 87 101* 119* 104*  BUN 12   < > _0 CREATININE 0.54   < > 0.53 0.61 0.56 0.70 0.73  CALCIUM 8.3*   < > 8.7* 8.1* 8.5* 8.6* 8.9  MG 2.2  --  2.6* 2.1 2.2 2.3  --   PHOS 3.4  --  3.5 3.6 3.7 3.3  --    < > = values in this interval not displayed.   GFR: Estimated Creatinine Clearance: 77.5 mL/min (by C-G formula based on SCr of 0.73 mg/dL). Liver Function Tests: Recent Labs  Lab 02/04/20 0438 02/05/20 0354 02/06/20 0420 02/07/20 0556 02/08/20 0457  AST _1 ALT _2 ALKPHOS 98 94 91 107 96  BILITOT 0.7 0.6 0.5 0.9 0.7  PROT 6.7 6.1* 6.2* 6.8 6.5  ALBUMIN 2.6* 2.5* 2.5* 2.8* 2.8*   No results for input(s): LIPASE, AMYLASE in the last 168 hours. No results for input(s): AMMONIA in the last 168 hours. Coagulation Profile: No results for input(s): INR, PROTIME in the last 168 hours. Cardiac Enzymes: No results for input(s): CKTOTAL, CKMB, CKMBINDEX, TROPONINI in the last 168 hours. BNP (last 3 results) No results for input(s): PROBNP in  the last 8760 hours. HbA1C: No results for input(s): HGBA1C in the last 72 hours. CBG: Recent Labs  Lab 02/08/20 0808 02/08/20 1249 02/08/20 1721 02/08/20 2129 02/09/20 0858  GLUCAP 116* 125* 188* 171* 86   Lipid Profile: No results for input(s): CHOL, HDL, LDLCALC, TRIG, CHOLHDL, LDLDIRECT in the last 72 hours. Thyroid Function Tests: No results for input(s): TSH, T4TOTAL, FREET4, T3FREE, THYROIDAB in the last 72 hours. Anemia Panel: Recent  Labs    02/07/20 0556 02/08/20 0457  FERRITIN 127 127   Sepsis Labs: Recent Labs  Lab 02/03/20 1423 02/03/20 1716 02/06/20 0420 02/07/20 0556 02/08/20 0457  PROCALCITON <0.10  --  <0.10 <0.10 <0.10  LATICACIDVEN  --  1.4  --   --   --     Recent Results (from the past 240 hour(s))  Respiratory Panel by RT PCR (Flu A&B, Covid) - Nasopharyngeal Swab     Status: Abnormal   Collection Time: 02/03/20  3:06 PM   Specimen: Nasopharyngeal Swab  Result Value Ref Range Status   SARS Coronavirus 2 by RT PCR POSITIVE (A) NEGATIVE Final    Comment: RESULT CALLED TO, READ BACK BY AND VERIFIED WITH: KIM MAIN RN AT 1700 ON 02/03/20 SNG (NOTE) SARS-CoV-2 target nucleic acids are DETECTED. SARS-CoV-2 RNA is generally detectable in upper respiratory specimens  during the acute phase of infection. Positive results are indicative of the presence of the identified virus, but do not rule out bacterial infection or co-infection with other pathogens not detected by the test. Clinical correlation with patient history and other diagnostic information is necessary to determine patient infection status. The expected result is Negative. Fact Sheet for Patients:  PinkCheek.be Fact Sheet for Healthcare Providers: GravelBags.it This test is not yet approved or cleared by the [place] FDA and  has been authorized for detection and/or diagnosis of SARS-CoV-2 by FDA under an Emergency Use  Authorization (EUA).  This EUA will remain in effect (meaning this test can be used) fo r the duration of  the COVID-19 declaration under Section 564(b)(1) of the Act, 21 U.S.C. section 360bbb-3(b)(1), unless the authorization is terminated or revoked sooner.    Influenza A by PCR NEGATIVE NEGATIVE Final   Influenza B by PCR NEGATIVE NEGATIVE Final    Comment: (NOTE) The Xpert Xpress SARS-CoV-2/FLU/RSV assay is intended as an aid in  the diagnosis of influenza from Nasopharyngeal swab specimens and  should not be used as a sole basis for treatment. Nasal washings and  aspirates are unacceptable for Xpert Xpress SARS-CoV-2/FLU/RSV  testing. Fact Sheet for Patients: PinkCheek.be Fact Sheet for Healthcare Providers: GravelBags.it This test is not yet approved or cleared by the [place] FDA and  has been authorized for detection and/or diagnosis of SARS-CoV-2 by  FDA under an Emergency Use Authorization (EUA). This EUA will remain  in effect (meaning this test can be used) for the duration of the  Covid-19 declaration under Section 564(b)(1) of the Act, 21  U.S.C. section 360bbb-3(b)(1), unless the authorization is  terminated or revoked. Performed at [facility], [address] [place], [place] [address]   Blood culture (routine x 2)     Status: None   Collection Time: 02/03/20  5:58 PM   Specimen: BLOOD  Result Value Ref Range Status   Specimen Description BLOOD RAC  Final   Special Requests   Final    BOTTLES DRAWN AEROBIC AND ANAEROBIC Blood Culture adequate volume   Culture   Final    NO GROWTH 5 DAYS Performed at [facility], [address], [place], [place] [address]    Report Status 02/08/2020 FINAL  Final  Blood culture (routine x 2)     Status: None   Collection Time: 02/03/20  5:59 PM   Specimen: BLOOD  Result Value Ref Range Status   Specimen Description BLOOD RH  Final   Special  Requests   Final    BOTTLES DRAWN AEROBIC  AND ANAEROBIC Blood Culture adequate volume   Culture   Final    NO GROWTH 5 DAYS Performed at [facility], [address] [place], [place] [address]    Report Status 02/08/2020 FINAL  Final  MRSA PCR Screening     Status: None   Collection Time: 02/04/20  1:34 PM   Specimen: Nasopharyngeal  Result Value Ref Range Status   MRSA by PCR NEGATIVE NEGATIVE Final    Comment:        The GeneXpert MRSA Assay (FDA approved for NASAL specimens only), is one component of a comprehensive MRSA colonization surveillance program. It is not intended to diagnose MRSA infection nor to guide or monitor treatment for MRSA infections. Performed at [facility], [address] [place], [place] [address]          Radiology Studies: No results found.      Scheduled Meds: . albuterol  2 puff Inhalation Q6H  . vitamin C  250 mg Oral Daily  . aspirin EC  81 mg Oral Daily  . baclofen  10 mg Oral TID  . Chlorhexidine Gluconate Cloth  6 each Topical Daily  . cholecalciferol  2,000 Units Oral Daily  . dexamethasone (DECADRON) injection  6 mg Intravenous Daily  . digoxin  0.25 mg Oral Daily  . enoxaparin (LOVENOX) injection  40 mg Subcutaneous Q24H  . feeding supplement (ENSURE ENLIVE)  237 mL Oral BID BM  . fluticasone furoate-vilanterol  1 puff Inhalation Daily   And  . umeclidinium bromide  1 puff Inhalation Daily  . furosemide  40 mg Intravenous Once  . gabapentin  400 mg Oral TID  . insulin aspart  0-15 Units Subcutaneous TID WC  . insulin aspart  0-5 Units Subcutaneous QHS  . insulin aspart  3 Units Subcutaneous TID WC  . ivermectin  150 mcg/kg Oral Daily  . lidocaine  1 patch Transdermal Q24H  . pantoprazole (PROTONIX) IV  40 mg Intravenous QHS  . sertraline  200 mg Oral Daily  . torsemide  40 mg Oral BID  . valACYclovir  1,000 mg Oral TID  . zinc sulfate  220 mg Oral Daily   Continuous Infusions:    LOS: 6  days    Time spent: 35 min    Sidney Ace, MD [facility] Pager [phone]  If 7PM-7AM, please contact night-coverage 02/09/2020, 10:49 AM

## 2020-02-09 NOTE — Progress Notes (Signed)
Alerted Dr. Priscella Mann that patient's BP is 103/48. Asymptomatic.This is fairly typical for patient. MD states to continue to monitor and okay to give torsemide. Will continue to monitor. All safety measures in place.

## 2020-02-09 NOTE — Progress Notes (Signed)
Alerted Dr. Priscella Mann that patient stated she has not had a BM since she was admitted. Md placed orders for KUB and stool softners- miralax given. Patient denies abdominal pain. Will continue to monitor.

## 2020-02-09 NOTE — Progress Notes (Signed)
Alerted Dr. Priscella Mann that patient has a BP difference on her arms. BP on right arm was 91/37, but patient is completely asymptomatic. BP on left arm was 101/52. As patient is asymptomatic, BP on left arm seems more realistic. MD acknowledges, no new orders. Will continue to monitor.

## 2020-02-10 DIAGNOSIS — U071 COVID-19: Secondary | ICD-10-CM | POA: Diagnosis not present

## 2020-02-10 DIAGNOSIS — J9601 Acute respiratory failure with hypoxia: Secondary | ICD-10-CM | POA: Diagnosis not present

## 2020-02-10 DIAGNOSIS — J1282 Pneumonia due to Coronavirus disease 2019: Secondary | ICD-10-CM | POA: Diagnosis not present

## 2020-02-10 LAB — MAGNESIUM: Magnesium: 2.7 mg/dL — ABNORMAL HIGH (ref 1.7–2.4)

## 2020-02-10 LAB — BASIC METABOLIC PANEL
Anion gap: 11 (ref 5–15)
BUN: 22 mg/dL (ref 8–23)
CO2: 37 mmol/L — ABNORMAL HIGH (ref 22–32)
Calcium: 8.6 mg/dL — ABNORMAL LOW (ref 8.9–10.3)
Chloride: 91 mmol/L — ABNORMAL LOW (ref 98–111)
Creatinine, Ser: 0.68 mg/dL (ref 0.44–1.00)
GFR calc Af Amer: >60 mL/min (ref >60)
GFR calc non Af Amer: >60 mL/min (ref >60)
Glucose, Bld: 85 mg/dL (ref 70–99)
Potassium: 3.9 mmol/L (ref 3.5–5.1)
Sodium: 139 mmol/L (ref 135–145)

## 2020-02-10 LAB — GLUCOSE, CAPILLARY
Glucose-Capillary: 103 mg/dL — ABNORMAL HIGH (ref 70–99)
Glucose-Capillary: 112 mg/dL — ABNORMAL HIGH (ref 70–99)
Glucose-Capillary: 116 mg/dL — ABNORMAL HIGH (ref 70–99)
Glucose-Capillary: 154 mg/dL — ABNORMAL HIGH (ref 70–99)

## 2020-02-10 LAB — PHOSPHORUS: Phosphorus: 4 mg/dL (ref 2.5–4.6)

## 2020-02-10 MED ORDER — FLEET ENEMA 7-19 GM/118ML RE ENEM: 1.0000 | ENEMA | Freq: Every day | RECTAL | Status: DC | PRN

## 2020-02-10 MED ORDER — PANTOPRAZOLE SODIUM 40 MG PO TBEC
40.0000 mg | DELAYED_RELEASE_TABLET | Freq: Every day | ORAL | Status: DC
  Administered 2020-02-10: 22:00:00 40 mg via ORAL
  Filled 2020-02-10: qty 1

## 2020-02-10 MED ORDER — HYDROMORPHONE HCL 1 MG/ML IJ SOLN: 0.5000 mg | Freq: Three times a day (TID) | INTRAMUSCULAR | Status: DC | PRN

## 2020-02-10 MED ORDER — POTASSIUM CHLORIDE CRYS ER 20 MEQ PO TBCR
20.0000 meq | EXTENDED_RELEASE_TABLET | Freq: Once | ORAL | Status: AC
  Administered 2020-02-10: 09:00:00 20 meq via ORAL
  Filled 2020-02-10: qty 1

## 2020-02-10 MED ORDER — HYDROCODONE-ACETAMINOPHEN 5-325 MG PO TABS
1.0000 | ORAL_TABLET | Freq: Four times a day (QID) | ORAL | Status: DC | PRN
  Administered 2020-02-10 – 2020-02-11 (×3): 1 via ORAL
  Filled 2020-02-10 (×3): qty 1

## 2020-02-10 NOTE — Progress Notes (Signed)
PHARMACIST - PHYSICIAN COMMUNICATION  CONCERNING: IV to Oral Route Change Policy  RECOMMENDATION: This patient is receiving Protonix by the intravenous route.  Based on criteria approved by the Pharmacy and Therapeutics Committee, the intravenous medication(s) is/are being converted to the equivalent oral dose form(s).   DESCRIPTION: These criteria include:  The patient is eating (either orally or via tube) and/or has been taking other orally administered medications for a least 24 hours  The patient has no evidence of active gastrointestinal bleeding or impaired GI absorption (gastrectomy, short bowel, patient on TNA or NPO).  If you have questions about this conversion, please contact the Pharmacy Department.  Pernell Dupre, PharmD, BCPS Clinical Pharmacist 02/10/2020 8:32 AM

## 2020-02-10 NOTE — Progress Notes (Signed)
As patient has not had a BM in several days, Miralax was given this AM. Patient did not have a BM this afternoon, so dulcolax suppository given. Alerted Dr. Priscella Mann of this. MD stated that if patient does not have a BM the rest of today, to place order for PRN fleet enema for patient to receive tonight. Will pass along to night shift and monitor patient.   Patient had a small BM around 1700. Will continue to monitor.

## 2020-02-10 NOTE — Progress Notes (Signed)
PROGRESS NOTE    Theresa [name]  TMH:[id number] DOB: 1956-03-11 DOA: 02/03/2020 PCP: Margo Common, PA   Brief Narrative:  Theresa [name]  is a 64 y.o. female with a known history of anxiety, CHF, COPD, depression, GERDbeing admitted for COVID pneumonia. She came in for shortness of breath worsening over the last week with productive cough. Patient denies any fevers, states she usually wears 2-1/2 L of oxygen at baseline. She presents with hypoxia and tachypnea. O2 sats in 80s requiring BiPAP. covid is +.  4/21: Seen and examined.  Remains in stepdown status.  Weaned to high flow nasal cannula.  Currently on 15 L high flow.  Mentating well.  No fevers over interval.  4/22: Seen and examined.  Remains in stepdown status.  Remains on 15 L high flow nasal cannula.  Used BiPAP for approximately 5 hours last night.  Mentating well.  Endorsing hunger.  No fevers noted over interval.  4/23: Seen and examined.  On BiPAP of time my evaluation this morning.  Yesterday was able to wean only to 12 L high flow nasal cannula.  Using BiPAP every night.  Mentating clearly.  Voice seems stronger today.  Endorsing hunger and tolerating p.o.  No fevers over interval.  4/24: Patient seen and examined.  Weaned to 8 to 10 L nasal cannula.  Clinically improving.  BiPAP nightly.  Mentating clearly.  No fevers noted over interval.  Stable for transfer to 1C.  4/25: Patient seen and examined.  Wean to 8 L nasal cannula.  Clinically appears improved.  Voice getting stronger.  Nightly NIPPV.  Mentating clearly.  No fevers noted.  4/26: Patient seen and examined.  Oxygen improved.  Weaned to 2 L nasal cannula.  Clinically appears improved.  Continues to complain of right hip pain of unclear etiology.  Start empirically on Valtrex yesterday given history of herpes zoster   Assessment & Plan:   Active Problems:   Pneumonia due to COVID-19 virus Acute hypoxic resp failure - Present on admission, Due to COVID  pneumonia. required BiPAP initially Weaned off to 2L liters O2 - maintains sats in 88-92% Completed 5-day course of remdesivir Ivermectin stopped after 6 doses Plan: Continue Decadron to complete 10-day course Continue supplemental vitamins Wean oxygen as tolerated Continue nightly NIPPV Trend inflammatory marker  Follow cultures Pulmonary hygeine  Hypokalemia Replete & recheck, check Mg Pharmacy consulted for electrolyte monitoring and replacement  Acute on chronic diastolic CHF - Last echo showed normal EF - given one dose of lasix per PCCM - seems well compensated at this time  Hyperglycemia No h/o DM, likely due to steroids, SSI for now HbA1c 6.4  Acute hip pain Unclear etiology Patient is concerned about shingles No lesions to support this diagnosis 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 Plan: Multimodal pain regimen Out of bed to chair, ambulate Continue empiric Valtrex     DVT prophylaxis: Lovenox Code Status: Full Family Communication: None today Disposition Plan:Status is: Inpatient  Status is: Inpatient  Remains inpatient appropriate because:Inpatient level of care appropriate due to severity of illness   Dispo: The patient is from: Home              Anticipated d/c is to: Home              Anticipated d/c date is: 1 day              Patient currently is  not medically stable to d/c.   Oxygen status improving but not at goal.  Anticipate patient will need discharge home with home health physical therapy.  Tentative plan to discharge home on 02/11/2020.    Consultants:   ICU  Procedures:   BPAP  Antimicrobials:   Remdesivir, completed course   Subjective: Seen and examined Clinically improving On 2 L high flow nasal cannula   Objective: Vitals:   02/10/20 0020 02/10/20 0400 02/10/20 0759 02/10/20 0800  BP: (!) 94/53 (!) 91/45  (!) 126/57  Pulse:  63 67  71  Resp: _0 Temp: (!) 97.4 F (36.3 C)   98.2 F (36.8 C)  TempSrc: Oral     SpO2: 98% 100% 100% 100%  Weight:      Height:        Intake/Output Summary (Last 24 hours) at 02/10/2020 1022 Last data filed at 02/09/2020 2118 Gross per 24 hour  Intake --  Output 1900 ml  Net -1900 ml   Filed Weights   02/07/20 0156 02/08/20 0500 02/09/20 0500  Weight: 94.1 kg 93.9 kg 93 kg    Examination:  General exam: Appears calm and comfortable  Respiratory system: Scattered bl rhonchi, normal WOB Cardiovascular system: S1 & S2 heard, RRR. No JVD, murmurs, rubs, gallops or clicks. No pedal edema. Gastrointestinal system: Abdomen is nondistended, soft and nontender. No organomegaly or masses felt. Normal bowel sounds heard. Central nervous system: Alert and oriented. No focal neurological deficits. Extremities: Symmetric 5 x 5 power. Skin: No rashes, lesions or ulcers Psychiatry: Judgement and insight appear normal. Mood & affect appropriate.     Data Reviewed: I have personally reviewed following labs and imaging studies  CBC: Recent Labs  Lab 02/04/20 0438 02/05/20 0354 02/06/20 0420 02/07/20 0556 02/08/20 0457  WBC 10.8* 13.6* 8.1 10.5 12.1*  NEUTROABS 9.5* 11.2* 5.9 8.1* 10.1*  HGB 12.8 12.2 12.3 14.1 13.7  HCT 41.4 42.2 43.0 46.3* 46.9*  MCV 89.0 93.2 93.9 89.9 92.0  PLT 154 168 203 231 811   Basic Metabolic Panel: Recent Labs  Lab 02/05/20 0354 02/05/20 0354 02/06/20 0420 02/07/20 0556 02/08/20 0457 02/09/20 0552 02/10/20 0513  NA 143   < > 144 141 137 139 139  K 3.2*   < > 3.5 3.0* 4.1 3.9 3.9  CL 96*   < > 94* 88* 89* 89* 91*  CO2 36*   < > 38* 39* 36* 37* 37*  GLUCOSE 101*   < > 87 101* 119* 104* 85  BUN 15   < > _1 CREATININE 0.53   < > 0.61 0.56 0.70 0.73 0.68  CALCIUM 8.7*   < > 8.1* 8.5* 8.6* 8.9 8.6*  MG 2.6*  --  2.1 2.2 2.3  --  2.7*  PHOS 3.5  --  3.6 3.7 3.3  --  4.0   < > = values in this interval not displayed.    GFR: Estimated Creatinine Clearance: 77.5 mL/min (by C-G formula based on SCr of 0.68 mg/dL). Liver Function Tests: Recent Labs  Lab 02/04/20 0438 02/05/20 0354 02/06/20 0420 02/07/20 0556 02/08/20 0457  AST _2 ALT _3 ALKPHOS 98 94 91 107 96  BILITOT 0.7 0.6 0.5 0.9 0.7  PROT 6.7 6.1* 6.2* 6.8 6.5  ALBUMIN 2.6* 2.5* 2.5* 2.8* 2.8*   No results for input(s): LIPASE, AMYLASE in the last 168 hours. No results for  input(s): AMMONIA in the last 168 hours. Coagulation Profile: No results for input(s): INR, PROTIME in the last 168 hours. Cardiac Enzymes: No results for input(s): CKTOTAL, CKMB, CKMBINDEX, TROPONINI in the last 168 hours. BNP (last 3 results) No results for input(s): PROBNP in the last 8760 hours. HbA1C: No results for input(s): HGBA1C in the last 72 hours. CBG: Recent Labs  Lab 02/09/20 0858 02/09/20 1134 02/09/20 1704 02/09/20 2114 02/10/20 0837  GLUCAP 86 149* 159* 139* 103*   Lipid Profile: No results for input(s): CHOL, HDL, LDLCALC, TRIG, CHOLHDL, LDLDIRECT in the last 72 hours. Thyroid Function Tests: No results for input(s): TSH, T4TOTAL, FREET4, T3FREE, THYROIDAB in the last 72 hours. Anemia Panel: Recent Labs    02/08/20 0457  FERRITIN 127   Sepsis Labs: Recent Labs  Lab 02/03/20 1423 02/03/20 1716 02/06/20 0420 02/07/20 0556 02/08/20 0457  PROCALCITON <0.10  --  <0.10 <0.10 <0.10  LATICACIDVEN  --  1.4  --   --   --     Recent Results (from the past 240 hour(s))  Respiratory Panel by RT PCR (Flu A&B, Covid) - Nasopharyngeal Swab     Status: Abnormal   Collection Time: 02/03/20  3:06 PM   Specimen: Nasopharyngeal Swab  Result Value Ref Range Status   SARS Coronavirus 2 by RT PCR POSITIVE (A) NEGATIVE Final    Comment: RESULT CALLED TO, READ BACK BY AND VERIFIED WITH: KIM MAIN RN AT 1700 ON 02/03/20 SNG (NOTE) SARS-CoV-2 target nucleic acids are DETECTED. SARS-CoV-2 RNA is generally detectable in upper  respiratory specimens  during the acute phase of infection. Positive results are indicative of the presence of the identified virus, but do not rule out bacterial infection or co-infection with other pathogens not detected by the test. Clinical correlation with patient history and other diagnostic information is necessary to determine patient infection status. The expected result is Negative. Fact Sheet for Patients:  PinkCheek.be Fact Sheet for Healthcare Providers: GravelBags.it This test is not yet approved or cleared by the [place] FDA and  has been authorized for detection and/or diagnosis of SARS-CoV-2 by FDA under an Emergency Use Authorization (EUA).  This EUA will remain in effect (meaning this test can be used) fo r the duration of  the COVID-19 declaration under Section 564(b)(1) of the Act, 21 U.S.C. section 360bbb-3(b)(1), unless the authorization is terminated or revoked sooner.    Influenza A by PCR NEGATIVE NEGATIVE Final   Influenza B by PCR NEGATIVE NEGATIVE Final    Comment: (NOTE) The Xpert Xpress SARS-CoV-2/FLU/RSV assay is intended as an aid in  the diagnosis of influenza from Nasopharyngeal swab specimens and  should not be used as a sole basis for treatment. Nasal washings and  aspirates are unacceptable for Xpert Xpress SARS-CoV-2/FLU/RSV  testing. Fact Sheet for Patients: PinkCheek.be Fact Sheet for Healthcare Providers: GravelBags.it This test is not yet approved or cleared by the [place] FDA and  has been authorized for detection and/or diagnosis of SARS-CoV-2 by  FDA under an Emergency Use Authorization (EUA). This EUA will remain  in effect (meaning this test can be used) for the duration of the  Covid-19 declaration under Section 564(b)(1) of the Act, 21  U.S.C. section 360bbb-3(b)(1), unless the authorization is  terminated  or revoked. Performed at [facility], [address] [place], [place] [address]   Blood culture (routine x 2)     Status: None   Collection Time: 02/03/20  5:58 PM   Specimen: BLOOD  Result Value Ref Range Status   Specimen Description BLOOD RAC  Final   Special Requests   Final    BOTTLES DRAWN AEROBIC AND ANAEROBIC Blood Culture adequate volume   Culture   Final    NO GROWTH 5 DAYS Performed at [facility], [address] [place], [place] [address]    Report Status 02/08/2020 FINAL  Final  Blood culture (routine x 2)     Status: None   Collection Time: 02/03/20  5:59 PM   Specimen: BLOOD  Result Value Ref Range Status   Specimen Description BLOOD RH  Final   Special Requests   Final    BOTTLES DRAWN AEROBIC AND ANAEROBIC Blood Culture adequate volume   Culture   Final    NO GROWTH 5 DAYS Performed at [facility], [address] [place], [place] [address]    Report Status 02/08/2020 FINAL  Final  MRSA PCR Screening     Status: None   Collection Time: 02/04/20  1:34 PM   Specimen: Nasopharyngeal  Result Value Ref Range Status   MRSA by PCR NEGATIVE NEGATIVE Final    Comment:        The GeneXpert MRSA Assay (FDA approved for NASAL specimens only), is one component of a comprehensive MRSA colonization surveillance program. It is not intended to diagnose MRSA infection nor to guide or monitor treatment for MRSA infections. Performed at [facility], [address], [place], [place] [address]          Radiology Studies: DG Abd 1 View  Result Date: 02/09/2020 CLINICAL DATA:  Constipation, COVID positive on 04/19 EXAM: ABDOMEN - 1 VIEW COMPARISON:  07/13/2019 chest x-ray 02/06/2020 FINDINGS: Extensive stool throughout the visualized colon. Stool and gas in the rectum. More stool in the proximal and mid colon than in the distal colon. No sign of small bowel obstruction. Visualized skeletal structures with degenerative  changes, no acute bone process. Also with signs of vascular calcification. Improved aeration/decreased opacity at the LEFT lung base still with coarse interstitial markings bilaterally worse on the LEFT. IMPRESSION: 1. Stool throughout the colon. Is. No sign of small bowel obstruction. 2. Improved aeration/decreased opacity at the LEFT lung base, coarse interstitial markings remain, remaining suspicious for multifocal pneumonia. Electronically Signed   By: Zetta Bills M.D.   On: 02/09/2020 16:57        Scheduled Meds: . albuterol  2 puff Inhalation Q6H  . vitamin C  250 mg Oral Daily  . aspirin EC  81 mg Oral Daily  . baclofen  10 mg Oral TID  . Chlorhexidine Gluconate Cloth  6 each Topical Daily  . cholecalciferol  2,000 Units Oral Daily  . dexamethasone (DECADRON) injection  6 mg Intravenous Daily  . digoxin  0.25 mg Oral Daily  . enoxaparin (LOVENOX) injection  40 mg Subcutaneous Q24H  . feeding supplement (ENSURE ENLIVE)  237 mL Oral BID BM  . fluticasone furoate-vilanterol  1 puff Inhalation Daily   And  . umeclidinium bromide  1 puff Inhalation Daily  . furosemide  40 mg Intravenous Once  . gabapentin  400 mg Oral TID  . insulin aspart  0-15 Units Subcutaneous TID WC  . insulin aspart  0-5 Units Subcutaneous QHS  . insulin aspart  3 Units Subcutaneous TID WC  . lidocaine  1 patch Transdermal Q24H  . pantoprazole  40 mg Oral QHS  . polyethylene glycol  17 g Oral Daily  . senna-docusate  1 tablet  Oral BID  . sertraline  200 mg Oral Daily  . torsemide  40 mg Oral BID  . valACYclovir  1,000 mg Oral TID  . zinc sulfate  220 mg Oral Daily   Continuous Infusions:    LOS: 7 days    Time spent: 35 min    Sidney Ace, MD [facility] Pager [phone]  If 7PM-7AM, please contact night-coverage 02/10/2020, 10:22 AM

## 2020-02-10 NOTE — Progress Notes (Addendum)
Physical therapist was in patient's room and noticed that patient had a pocket knife in her bag when patient asked her to grab something for her out of bag. Told patient that per policy we have to have security hold on to it, patient was agreeable to this.   Bag searched with patient's consent. Confiscated a pocket knife, and ice blade, two bags of nail care (nail clippers, etc) and a vape pen. Called security who took down to safe. Patient is agreeable to this.

## 2020-02-10 NOTE — Progress Notes (Signed)
Physical Therapy Treatment Patient Details Name: Theresa [name] MRN: [id number] DOB: October 23, 1955 Today's Date: 02/10/2020    History of Present Illness Theresa [name] is a 64 yo female with a PMH of Tobacco Abuse, Obesity, GERD, DDD, COPD (Bipap qhs), CHF, and Anxiety. She presented to [name] ER on 04/19 with c/o shortness of breath, cough, nausea, vomiting, and diarrhea onset of symptoms a few days prior to presentation although n/v/d has since resolved. She presents with hypoxia and tachypnea. covid is +.     PT Comments    Pt ready for session.  Up in chair.  Needs to change pad/brief due to inc urine.  Pt able to change on her own with no LOB and generally steady.  Stated she has walker at home but has no plans to use it and does not want it today during session.  She is able to walk to door and back without AD but has almost constant UE tactile support on walls and bed.  She agreed she was unsteady but stated she did not think she would use her walker that she has at home.  Encouraged her to do so and have +1 assist for safety until strength and balance improves.  "You know I'll do what I want right?"    Of note, while in bags to get personal items she requested, I found several lighters and a jack knife blade.  Relayed to primary nurse.   Follow Up Recommendations  Home health PT;Supervision/Assistance - 24 hour     Equipment Recommendations  None recommended by PT(pt has walker at home)    Recommendations for Other Services       Precautions / Restrictions Precautions Precautions: Fall Restrictions Weight Bearing Restrictions: No    Mobility  Bed Mobility               General bed mobility comments: deferred.  remained in recliner before and after  Transfers Overall transfer level: Needs assistance Equipment used: None             General transfer comment: stood and steady in standing while taking briefs off in standing and performing self  care.  Ambulation/Gait Ambulation/Gait assistance: Min guard Gait Distance (Feet): 40 Feet Assistive device: None Gait Pattern/deviations: Step-through pattern;Decreased step length - right;Decreased step length - left;Staggering left;Staggering right     General Gait Details: refused RW today   Stairs             Wheelchair Mobility    Modified Rankin (Stroke Patients Only)       Balance Overall balance assessment: Needs assistance Sitting-balance support: Bilateral upper extremity supported;Feet supported Sitting balance-Leahy Scale: Good     Standing balance support: No upper extremity supported Standing balance-Leahy Scale: Fair Standing balance comment: unsteady without AD but able to correct imbalances without intervention however she reaches for walls/bed for support.                            Cognition Arousal/Alertness: Awake/alert Behavior During Therapy: WFL for tasks assessed/performed Overall Cognitive Status: Within Functional Limits for tasks assessed                                        Exercises      General Comments        Pertinent Vitals/Pain Pain Assessment: No/denies pain  Home Living                      Prior Function            PT Goals (current goals can now be found in the care plan section) Progress towards PT goals: Progressing toward goals    Frequency    Min 2X/week      PT Plan Current plan remains appropriate    Co-evaluation              AM-PAC PT "6 Clicks" Mobility   Outcome Measure  Help needed turning from your back to your side while in a flat bed without using bedrails?: None Help needed moving from lying on your back to sitting on the side of a flat bed without using bedrails?: None Help needed moving to and from a bed to a chair (including a wheelchair)?: None Help needed standing up from a chair using your arms (e.g., wheelchair or bedside  chair)?: None Help needed to walk in hospital room?: A Little Help needed climbing 3-5 steps with a railing? : A Little 6 Click Score: 22    End of Session Equipment Utilized During Treatment: Gait belt Activity Tolerance: Patient limited by fatigue;Patient tolerated treatment well Patient left: in chair;with call bell/phone within reach Nurse Communication: Mobility status       Time: 1440-1507 PT Time Calculation (min) (ACUTE ONLY): 27 min  Charges:  $Gait Training: 8-22 mins $Therapeutic Activity: 8-22 mins                     Chesley Noon, PTA 02/10/20, 4:17 PM

## 2020-02-10 NOTE — Consult Note (Signed)
PHARMACY CONSULT NOTE - FOLLOW UP  Pharmacy Consult for Electrolyte Monitoring and Replacement   Recent Labs: Potassium (mmol/L)  Date Value  02/10/2020 3.9  02/02/2015 3.9   Magnesium (mg/dL)  Date Value  02/10/2020 2.7 (H)   Calcium (mg/dL)  Date Value  02/10/2020 8.6 (L)   Calcium, Total (mg/dL)  Date Value  02/02/2015 8.7 (L)   Albumin (g/dL)  Date Value  02/08/2020 2.8 (L)  10/30/2019 4.1  06/12/2014 3.7   Phosphorus (mg/dL)  Date Value  02/10/2020 4.0   Sodium (mmol/L)  Date Value  02/10/2020 139  10/30/2019 141  02/02/2015 141     Assessment: Patient is a 64 y/o F admitted with respiratory failure secondary to COVID-19 pneumonia and AECOPD requiring HFNC. Pharmacy has been consulted for electrolyte monitoring and replacement.   Patient is on torsemide 40 mg BID ; UOP good. Last charted BM 4/16.   Patient is on digoxin 0.25 mg daily. Will target K of 4 and Mg of 2.    Goal of Therapy:  Potassium of 4 Magnesium of 2 Phosphorous within normal limits  Plan:  K 3.9    -Will order KCL PO 20 meq x 1 since on torsemide.    Will re-assess in w/ am labs  Pernell Dupre, PharmD, BCPS Clinical Pharmacist 02/10/2020 7:50 AM

## 2020-02-10 NOTE — Care Management Important Message (Signed)
Important Message  Patient Details  Name: Theresa [name] MRN: [id number] Date of Birth: 08/06/1956   Medicare Important Message Given:  Yes     Shelbie Hutching, RN 02/10/2020, 12:23 PM

## 2020-02-11 DIAGNOSIS — U071 COVID-19: Secondary | ICD-10-CM | POA: Diagnosis not present

## 2020-02-11 DIAGNOSIS — J1282 Pneumonia due to Coronavirus disease 2019: Secondary | ICD-10-CM | POA: Diagnosis not present

## 2020-02-11 LAB — POTASSIUM: Potassium: 3.8 mmol/L (ref 3.5–5.1)

## 2020-02-11 LAB — GLUCOSE, CAPILLARY
Glucose-Capillary: 83 mg/dL (ref 70–99)
Glucose-Capillary: 86 mg/dL (ref 70–99)

## 2020-02-11 MED ORDER — DEXAMETHASONE 6 MG PO TABS
6.0000 mg | ORAL_TABLET | Freq: Every day | ORAL | 0 refills | Status: AC
Start: 2020-02-11 — End: 2020-02-14

## 2020-02-11 MED ORDER — POTASSIUM CHLORIDE CRYS ER 20 MEQ PO TBCR
20.0000 meq | EXTENDED_RELEASE_TABLET | Freq: Two times a day (BID) | ORAL | Status: DC
  Administered 2020-02-11: 20 meq via ORAL
  Filled 2020-02-11: qty 1

## 2020-02-11 MED ORDER — VALACYCLOVIR HCL 1 G PO TABS: 1000.0000 mg | ORAL_TABLET | Freq: Three times a day (TID) | ORAL | 0 refills | Status: AC

## 2020-02-11 MED ORDER — ZOLPIDEM TARTRATE 5 MG PO TABS: 5.0000 mg | ORAL_TABLET | Freq: Every evening | ORAL | 0 refills | Status: DC | PRN

## 2020-02-11 MED ORDER — SENNOSIDES-DOCUSATE SODIUM 8.6-50 MG PO TABS: 1.0000 | ORAL_TABLET | Freq: Two times a day (BID) | ORAL | 0 refills | Status: AC

## 2020-02-11 MED ORDER — ASCORBIC ACID 250 MG PO TABS: 250.0000 mg | ORAL_TABLET | Freq: Every day | ORAL | 0 refills | Status: AC

## 2020-02-11 MED ORDER — ZINC SULFATE 220 (50 ZN) MG PO CAPS: 220.0000 mg | ORAL_CAPSULE | Freq: Every day | ORAL | 0 refills | Status: AC

## 2020-02-11 NOTE — TOC Transition Note (Signed)
Transition of Care [facility]) - CM/SW Discharge Note   Patient Details  Name: Theresa [name] MRN: [id number] Date of Birth: 24-Oct-1955  [facility] of [facility]) CM/SW Contact:  Shelbie Hutching, RN Phone Number: 02/11/2020, 11:16 AM   Clinical Narrative:    Patient is medically cleared for discharge home.  Patient's husband is coming to pick her up.  Home Health PT and OT will be provided by [facility].  Patient would like for her oxygen company to be changed to [employer].  Referral faxed to [place] with oxygen order and demographics.  [facility] will coordinate with the patient on getting the equipment to the home.  Patient currently has [employer] and will need to call and have their equipment returned once [employer] is set up.      Final next level of care: Home w Home Health Services Barriers to Discharge: Barriers Resolved   Patient Goals and CMS Choice   CMS Medicare.gov Compare Post Acute Care list provided to:: Patient Choice offered to / list presented to : Patient  Discharge Placement                       Discharge Plan and Services   Discharge Planning Services: CM Consult Post Acute Care Choice: Home Health, Durable Medical Equipment            DME Agency: [facility] Date DME Agency Contacted: 02/11/20     HH Arranged: RN, PT, OT [place] Agency: [place] Date [facility] Agency Contacted: 02/11/20 Time [place]: [phone] Representative spoke with at [place]: [place] ([place]) Interventions     Readmission Risk Interventions Readmission Risk Prevention Plan 02/05/2020 07/13/2019  Transportation Screening Complete Complete  PCP or Specialist Appt within 5-7 Days - Complete  PCP or Specialist Appt within 3-5 Days Complete -  Home Care Screening - Complete  Medication Review (RN CM) - Complete  HRI or Home Care Consult Complete -  Medication Review (RN Care Manager) Complete -  Some recent data might be hidden

## 2020-02-11 NOTE — Progress Notes (Signed)
Oxygen at 3 liters, removed to assess for baseline resting sats. Oxygen sats dropped to 86% without oxygen. Oxygen reapplied and patient recovered to 92% after 5 minutes.

## 2020-02-11 NOTE — Discharge Summary (Addendum)
Physician Discharge Summary  Theresa [name] KGY:[id number] DOB: 08-22-1956 DOA: 02/03/2020  PCP: Theresa Common, PA  Admit date: 02/03/2020 Discharge date: 02/11/2020  Admitted From: Home Disposition: Home with home health  Recommendations for Outpatient Follow-up:  1. Follow up with PCP in 1-2 weeks 2.   Home Health: Yes Equipment/Devices: Oxygen, 2 L  discharge Condition: Stable CODE STATUS: Full Diet recommendation: Heart Healthy / Carb Modified  Brief/Interim Summary: SherryShivelyis a63 y.o.femalewith a known history of anxiety, CHF, COPD, depression, GERDbeing admitted for COVID pneumonia. She came infor shortness of breathworseningover the last week with productive cough. Patient denies any fevers, states she usually wears 2-1/2 L of oxygen at baseline. She presents with hypoxia and tachypnea.O2 sats in 80s requiring BiPAP. covid is +.  4/21: Seen and examined.  Remains in stepdown status.  Weaned to high flow nasal cannula.  Currently on 15 L high flow.  Mentating well.  No fevers over interval.  4/22: Seen and examined.  Remains in stepdown status.  Remains on 15 L high flow nasal cannula.  Used BiPAP for approximately 5 hours last night.  Mentating well.  Endorsing hunger.  No fevers noted over interval.  4/23: Seen and examined.  On BiPAP of time my evaluation this morning.  Yesterday was able to wean only to 12 L high flow nasal cannula.  Using BiPAP every night.  Mentating clearly.  Voice seems stronger today.  Endorsing hunger and tolerating p.o.  No fevers over interval.  4/24: Patient seen and examined.  Weaned to 8 to 10 L nasal cannula.  Clinically improving.  BiPAP nightly.  Mentating clearly.  No fevers noted over interval.  Stable for transfer to 1C.  4/25: Patient seen and examined.  Wean to 8 L nasal cannula.  Clinically appears improved.  Voice getting stronger.  Nightly NIPPV.  Mentating clearly.  No fevers noted.  4/26: Patient seen and  examined.  Oxygen improved.  Weaned to 2 L nasal cannula.  Clinically appears improved.  Continues to complain of right hip pain of unclear etiology.  Start empirically on Valtrex yesterday given history of herpes zoster  4/27: Patient seen and examined.  Titrated down to 2.5 L.  Home rate.  Unable to titrate further.  Clinically appears improved and near baseline.  Continues to complain of right hip pain.  Unclear etiology.  Will treat empirically for herpes zoster.  Patient medically stable for discharge at this time.  Home health ordered.  Home DME oxygen ordered at 2.5 L.  Continuous and portable.  Remainder for medications unchanged at this time.  Outpatient PCP follow-up.  Discharge Diagnoses:  Active Problems:   Pneumonia due to COVID-19 virus   Pneumonia due to COVID-19 virus Acute on chronic hypoxic resp failure-Present on admission, Due to COVID pneumonia. Patient required BiPAP while in-house Was able to wean to 2 to 2-1/2 L nasal cannula This is her home rate Patient completed remdesivir 5-day course in house Patient completed ivermectin 6-day course in house Will complete 10-day course of steroids Resume home oxygen at home rate of 2.5 L on discharge Outpatient PCP follow-up  Hypokalemia Replete as needed  Acute on chronic diastolic CHF No acute decompensation noted in house Intravenous diuretic administered as needed Can resume home regimen on discharge  Hyperglycemia No h/o DM, likely due to steroids HbA1c 6.4  Acute hip pain Unclear etiology Patient is concerned about shingles No lesions to support this diagnosis 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 Plan: Multimodal pain regimen Out of bed to chair, ambulate Continue empiric Valtrex, complete empiric 7-day course  Discharge Instructions  Discharge Instructions    Diet - low sodium heart healthy   Complete by: As  directed    Increase activity slowly   Complete by: As directed    MyChart COVID-19 home monitoring program   Complete by: Feb 11, 2020    Is the patient willing to use the [place] for home monitoring?: Yes   Temperature monitoring   Complete by: Feb 11, 2020    After how many days would you like to receive a notification of this patient's flowsheet entries?: 1     Allergies as of 02/11/2020      Reactions   Codeine Nausea And Vomiting   Meloxicam Other (See Comments)   Stomach pain      Medication List    TAKE these medications   albuterol 108 (90 Base) MCG/ACT inhaler Commonly known as: VENTOLIN HFA Inhale 2 puffs into the lungs every 6 (six) hours as needed for wheezing or shortness of breath.   ascorbic acid 250 MG tablet Commonly known as: VITAMIN C Take 1 tablet (250 mg total) by mouth daily for 7 days. Start taking on: February 12, 2020   aspirin EC 81 MG tablet Take 81 mg by mouth daily.   baclofen 10 MG tablet Commonly known as: LIORESAL Take 1 tablet (10 mg total) by mouth 3 (three) times daily.   bisoprolol-hydrochlorothiazide 10-6.25 MG tablet Commonly known as: ZIAC TAKE 1 TABLET BY MOUTH DAILY   [employer] D3 50 MCG (2000 UT) Caps Generic drug: Cholecalciferol Take 2,000 Units by mouth daily.   dexamethasone 6 MG tablet Commonly known as: Decadron Take 1 tablet (6 mg total) by mouth daily for 3 days.   digoxin 0.25 MG tablet Commonly known as: LANOXIN TAKE 1 TABLET(0.25 MG) BY MOUTH DAILY   Fluticasone-Umeclidin-Vilant 100-62.5-25 MCG/INH Aepb Inhale 1 puff into the lungs daily.   gabapentin 400 MG capsule Commonly known as: NEURONTIN Take 1 capsule (400 mg total) by mouth 3 (three) times daily.   HYDROcodone-acetaminophen 5-325 MG tablet Commonly known as: NORCO/VICODIN Take 1 tablet by mouth every 8 (eight) hours as needed for severe pain. Must last 30 days What changed: Another medication with the same name was removed. Continue taking  this medication, and follow the directions you see here.   ipratropium-albuterol 0.5-2.5 (3) MG/3ML Soln Commonly known as: DUONEB USE 1 VIAL VIA NEBULIZER EVERY 6 HOURS AS NEEDED FOR SHORTNESS OF BREATH OR WHEEZING   levothyroxine 25 MCG tablet Commonly known as: SYNTHROID TAKE 1 TABLET(25 MCG) BY MOUTH DAILY BEFORE BREAKFAST   potassium chloride SA 20 MEQ tablet Commonly known as: KLOR-CON TAKE 1 TABLET(20 MEQ) BY MOUTH TWICE DAILY   senna-docusate 8.6-50 MG tablet Commonly known as: Senokot-S Take 1 tablet by mouth 2 (two) times daily for 7 days.   sertraline 100 MG tablet Commonly known as: ZOLOFT TAKE 2 TABLETS BY MOUTH EVERY MORNING   torsemide 20 MG tablet Commonly known as: DEMADEX TAKE 2 TABLETS(40 MG) BY MOUTH TWICE DAILY What changed: See the new instructions.   valACYclovir 1000 MG tablet Commonly known as: VALTREX Take 1 tablet (1,000 mg total) by mouth 3 (three) times daily for 5 days.   zinc sulfate 220 (50 Zn) MG capsule Take 1 capsule (220 mg total) by mouth daily for 7 days. Start taking on: February 12, 2020  zolpidem 5 MG tablet Commonly known as: AMBIEN Take 1 tablet (5 mg total) by mouth at bedtime as needed for up to 14 days for sleep.            Durable Medical Equipment  (From admission, onward)         Start     Ordered   02/11/20 1030  For home use only DME oxygen  Once    Question Answer Comment  Length of Need Lifetime   Mode or (Route) Nasal cannula   Liters per Minute 2.5   Frequency Continuous (stationary and portable oxygen unit needed)   Oxygen conserving device Yes   Oxygen delivery system Gas      02/11/20 1029         Follow-up Information    Chrismon, Vickki Muff, PA. Go on 02/18/2020.   Specialty: Family Medicine Why: _0 :20 AM Contact information: [address] [place] [address] [phone]        [employer]. Call in 1 day.   Why: Call [employer] to set up delivery once discharged from the  hospital [phone]  you will need to provide your [facility] Member ID.  Contact information: Contact once discharged from the hospital [phone]         Allergies  Allergen Reactions  . Codeine Nausea And Vomiting  . Meloxicam Other (See Comments)    Stomach pain    Consultations:  Intensivist   Procedures/Studies: DG Chest 1 View  Result Date: 02/03/2020 CLINICAL DATA:  Dyspnea, shortness of breath, productive cough EXAM: CHEST  1 VIEW COMPARISON:  10/22/2019 FINDINGS: Cardiomegaly. Diffuse bilateral heterogeneous and interstitial airspace opacity. The visualized skeletal structures are unremarkable. IMPRESSION: Cardiomegaly with diffuse bilateral heterogeneous and interstitial airspace opacity, which may reflect multifocal infection or edema. Electronically Signed   By: Eddie Candle M.D.   On: 02/03/2020 15:38   DG Abd 1 View  Result Date: 02/09/2020 CLINICAL DATA:  Constipation, COVID positive on 04/19 EXAM: ABDOMEN - 1 VIEW COMPARISON:  07/13/2019 chest x-ray 02/06/2020 FINDINGS: Extensive stool throughout the visualized colon. Stool and gas in the rectum. More stool in the proximal and mid colon than in the distal colon. No sign of small bowel obstruction. Visualized skeletal structures with degenerative changes, no acute bone process. Also with signs of vascular calcification. Improved aeration/decreased opacity at the LEFT lung base still with coarse interstitial markings bilaterally worse on the LEFT. IMPRESSION: 1. Stool throughout the colon. Is. No sign of small bowel obstruction. 2. Improved aeration/decreased opacity at the LEFT lung base, coarse interstitial markings remain, remaining suspicious for multifocal pneumonia. Electronically Signed   By: Zetta Bills M.D.   On: 02/09/2020 16:57   US Venous Img Lower Unilateral Right (DVT)  Result Date: 02/06/2020 CLINICAL DATA:  Right lower extremity pain and edema. History of smoking. Evaluate for DVT. EXAM: RIGHT LOWER  EXTREMITY VENOUS DOPPLER ULTRASOUND TECHNIQUE: Gray-scale sonography with graded compression, as well as color Doppler and duplex ultrasound were performed to evaluate the lower extremity deep venous systems from the level of the [name] femoral vein and including the [name] femoral, femoral, profunda femoral, popliteal and calf veins including the posterior tibial, peroneal and gastrocnemius veins when visible. The superficial great saphenous vein was also interrogated. Spectral Doppler was utilized to evaluate flow at rest and with distal augmentation maneuvers in the [name] femoral, femoral and popliteal veins. COMPARISON:  None. FINDINGS: Contralateral [name] Femoral Vein: Respiratory phasicity is normal and symmetric with the symptomatic side. No  evidence of thrombus. Normal compressibility. [name] Femoral Vein: No evidence of thrombus. Normal compressibility, respiratory phasicity and response to augmentation. Saphenofemoral Junction: No evidence of thrombus. Normal compressibility and flow on color Doppler imaging. Profunda Femoral Vein: No evidence of thrombus. Normal compressibility and flow on color Doppler imaging. Femoral Vein: No evidence of thrombus. Normal compressibility, respiratory phasicity and response to augmentation. Popliteal Vein: No evidence of thrombus. Normal compressibility, respiratory phasicity and response to augmentation. Calf Veins: No evidence of thrombus. Normal compressibility and flow on color Doppler imaging. Superficial Great Saphenous Vein: No evidence of thrombus. Normal compressibility. Venous Reflux:  None. Other Findings:  None. IMPRESSION: No evidence of DVT within the right lower extremity. Electronically Signed   By: Sandi Mariscal M.D.   On: 02/06/2020 10:34   DG Chest Port 1 View  Result Date: 02/06/2020 CLINICAL DATA:  Respiratory distress due to COVID. EXAM: PORTABLE CHEST 1 VIEW COMPARISON:  Chest x-rays dated 02/03/2020 10/22/2019. FINDINGS: Stable cardiomegaly.  The diffuse bilateral patchy alveolar and interstitial airspace opacities are stable in the short-term interval. No pneumothorax is seen. Osseous structures about the chest are unremarkable. IMPRESSION: 1. Stable chest x-ray. Stable diffuse bilateral patchy alveolar and interstitial airspace opacities, compatible with multifocal pneumonia. 2. Stable cardiomegaly. Electronically Signed   By: Franki Cabot M.D.   On: 02/06/2020 11:20   DG HIP UNILAT WITH PELVIS 2-3 VIEWS RIGHT  Result Date: 02/06/2020 CLINICAL DATA:  Right hip and inguinal pain EXAM: DG HIP (WITH OR WITHOUT PELVIS) 2-3V RIGHT COMPARISON:  12/29/2015 FINDINGS: Frontal view of the pelvis as well as frontal and frogleg lateral views of the right hip are obtained. No acute displaced fracture. Alignment is anatomic. Joint spaces are relatively well preserved. There is prominent spondylosis at the lumbosacral junction. Asymmetric sclerosis of the left sacroiliac joint. IMPRESSION: 1. No acute displaced fracture. 2. Prominent lower lumbar spondylosis. 3. Asymmetric left-sided sacroiliitis. Electronically Signed   By: Randa Ngo M.D.   On: 02/06/2020 17:11   (Echo, Carotid, EGD, Colonoscopy, ERCP)    Subjective: Patient seen and examined on the day of discharge Feels well, respirations of baseline Stable for discharge home  Discharge Exam: Vitals:   02/11/20 0009 02/11/20 0836  BP: 112/63   Pulse: 62   Resp: 19   Temp: 98.5 F (36.9 C)   SpO2: 95% 97%   Vitals:   02/10/20 0800 02/10/20 1639 02/11/20 0009 02/11/20 0836  BP: (!) 126/57 110/60 112/63   Pulse: 71  62   Resp: _0 Temp: 98.2 F (36.8 C) 98.4 F (36.9 C) 98.5 F (36.9 C)   TempSrc:   Axillary   SpO2: 100% 100% 95% 97%  Weight:      Height:        General: Pt is alert, awake, not in acute distress Cardiovascular: RRR, S1/S2 +, no rubs, no gallops Respiratory: Lung sounds diffusely decreased, mild bibasilar crackles, normal work of breathing, 2  L Abdominal: Soft, NT, ND, bowel sounds + Extremities: no edema, no cyanosis    The results of significant diagnostics from this hospitalization (including imaging, microbiology, ancillary and laboratory) are listed below for reference.     Microbiology: Recent Results (from the past 240 hour(s))  Respiratory Panel by RT PCR (Flu A&B, Covid) - Nasopharyngeal Swab     Status: Abnormal   Collection Time: 02/03/20  3:06 PM   Specimen: Nasopharyngeal Swab  Result Value Ref Range Status   SARS Coronavirus 2 by RT PCR POSITIVE (  A) NEGATIVE Final    Comment: RESULT CALLED TO, READ BACK BY AND VERIFIED WITH: KIM MAIN RN AT 1700 ON 02/03/20 SNG (NOTE) SARS-CoV-2 target nucleic acids are DETECTED. SARS-CoV-2 RNA is generally detectable in upper respiratory specimens  during the acute phase of infection. Positive results are indicative of the presence of the identified virus, but do not rule out bacterial infection or co-infection with other pathogens not detected by the test. Clinical correlation with patient history and other diagnostic information is necessary to determine patient infection status. The expected result is Negative. Fact Sheet for Patients:  PinkCheek.be Fact Sheet for Healthcare Providers: GravelBags.it This test is not yet approved or cleared by the [place] FDA and  has been authorized for detection and/or diagnosis of SARS-CoV-2 by FDA under an Emergency Use Authorization (EUA).  This EUA will remain in effect (meaning this test can be used) fo r the duration of  the COVID-19 declaration under Section 564(b)(1) of the Act, 21 U.S.C. section 360bbb-3(b)(1), unless the authorization is terminated or revoked sooner.    Influenza A by PCR NEGATIVE NEGATIVE Final   Influenza B by PCR NEGATIVE NEGATIVE Final    Comment: (NOTE) The Xpert Xpress SARS-CoV-2/FLU/RSV assay is intended as an aid in  the diagnosis  of influenza from Nasopharyngeal swab specimens and  should not be used as a sole basis for treatment. Nasal washings and  aspirates are unacceptable for Xpert Xpress SARS-CoV-2/FLU/RSV  testing. Fact Sheet for Patients: PinkCheek.be Fact Sheet for Healthcare Providers: GravelBags.it This test is not yet approved or cleared by the [place] FDA and  has been authorized for detection and/or diagnosis of SARS-CoV-2 by  FDA under an Emergency Use Authorization (EUA). This EUA will remain  in effect (meaning this test can be used) for the duration of the  Covid-19 declaration under Section 564(b)(1) of the Act, 21  U.S.C. section 360bbb-3(b)(1), unless the authorization is  terminated or revoked. Performed at [facility], [address] [place], [place] [address]   Blood culture (routine x 2)     Status: None   Collection Time: 02/03/20  5:58 PM   Specimen: BLOOD  Result Value Ref Range Status   Specimen Description BLOOD RAC  Final   Special Requests   Final    BOTTLES DRAWN AEROBIC AND ANAEROBIC Blood Culture adequate volume   Culture   Final    NO GROWTH 5 DAYS Performed at [facility], [address], [place], [place] [address]    Report Status 02/08/2020 FINAL  Final  Blood culture (routine x 2)     Status: None   Collection Time: 02/03/20  5:59 PM   Specimen: BLOOD  Result Value Ref Range Status   Specimen Description BLOOD RH  Final   Special Requests   Final    BOTTLES DRAWN AEROBIC AND ANAEROBIC Blood Culture adequate volume   Culture   Final    NO GROWTH 5 DAYS Performed at [facility], [address] [place], [place] [address]    Report Status 02/08/2020 FINAL  Final  MRSA PCR Screening     Status: None   Collection Time: 02/04/20  1:34 PM   Specimen: Nasopharyngeal  Result Value Ref Range Status   MRSA by PCR NEGATIVE NEGATIVE Final    Comment:        The GeneXpert  MRSA Assay (FDA approved for NASAL specimens only), is one component of a comprehensive MRSA colonization surveillance program. It is not  intended to diagnose MRSA infection nor to guide or monitor treatment for MRSA infections. Performed at [facility], [address] [place], [place] [address]      Labs: BNP (last 3 results) Recent Labs    07/09/19 1632 10/22/19 2232 02/03/20 1422  BNP 108.0* 221.0* 207.2*   Basic Metabolic Panel: Recent Labs  Lab 02/05/20 0354 02/05/20 0354 02/06/20 0420 02/06/20 0420 02/07/20 0556 02/08/20 0457 02/09/20 0552 02/10/20 0513 02/11/20 0649  NA 143   < > 144  --  141 137 139 139  --   K 3.2*   < > 3.5   < > 3.0* 4.1 3.9 3.9 3.8  CL 96*   < > 94*  --  88* 89* 89* 91*  --   CO2 36*   < > 38*  --  39* 36* 37* 37*  --   GLUCOSE 101*   < > 87  --  101* 119* 104* 85  --   BUN 15   < > 16  --  _0 --   CREATININE 0.53   < > 0.61  --  0.56 0.70 0.73 0.68  --   CALCIUM 8.7*   < > 8.1*  --  8.5* 8.6* 8.9 8.6*  --   MG 2.6*  --  2.1  --  2.2 2.3  --  2.7*  --   PHOS 3.5  --  3.6  --  3.7 3.3  --  4.0  --    < > = values in this interval not displayed.   Liver Function Tests: Recent Labs  Lab 02/05/20 0354 02/06/20 0420 02/07/20 0556 02/08/20 0457  AST _1 ALT _2 ALKPHOS 94 91 107 96  BILITOT 0.6 0.5 0.9 0.7  PROT 6.1* 6.2* 6.8 6.5  ALBUMIN 2.5* 2.5* 2.8* 2.8*   No results for input(s): LIPASE, AMYLASE in the last 168 hours. No results for input(s): AMMONIA in the last 168 hours. CBC: Recent Labs  Lab 02/05/20 0354 02/06/20 0420 02/07/20 0556 02/08/20 0457  WBC 13.6* 8.1 10.5 12.1*  NEUTROABS 11.2* 5.9 8.1* 10.1*  HGB 12.2 12.3 14.1 13.7  HCT 42.2 43.0 46.3* 46.9*  MCV 93.2 93.9 89.9 92.0  PLT 168 203 231 266   Cardiac Enzymes: No results for input(s): CKTOTAL, CKMB, CKMBINDEX, TROPONINI in the last 168 hours. BNP: Invalid input(s): POCBNP CBG: Recent Labs  Lab 02/10/20 0837  02/10/20 1143 02/10/20 1648 02/10/20 2105 02/11/20 0830  GLUCAP 103* 112* 116* 154* 83   D-Dimer No results for input(s): DDIMER in the last 72 hours. Hgb A1c No results for input(s): HGBA1C in the last 72 hours. Lipid Profile No results for input(s): CHOL, HDL, LDLCALC, TRIG, CHOLHDL, LDLDIRECT in the last 72 hours. Thyroid function studies No results for input(s): TSH, T4TOTAL, T3FREE, THYROIDAB in the last 72 hours.  Invalid input(s): FREET3 Anemia work up No results for input(s): VITAMINB12, FOLATE, FERRITIN, TIBC, IRON, RETICCTPCT in the last 72 hours. Urinalysis    Component Value Date/Time   COLORURINE YELLOW (A) 02/06/2020 2148   APPEARANCEUR CLEAR (A) 02/06/2020 2148   APPEARANCEUR Cloudy (A) 11/28/2016 1128   LABSPEC 1.009 02/06/2020 2148   LABSPEC 1.011 06/12/2014 2003   PHURINE 7.0 02/06/2020 2148   GLUCOSEU NEGATIVE 02/06/2020 2148   GLUCOSEU Negative 06/12/2014 2003   HGBUR NEGATIVE 02/06/2020 2148   BILIRUBINUR NEGATIVE 02/06/2020 2148   BILIRUBINUR Negative 10/30/2019 1516   BILIRUBINUR Negative  11/28/2016 [place] Negative 06/12/2014 2003   KETONESUR NEGATIVE 02/06/2020 2148   PROTEINUR NEGATIVE 02/06/2020 2148   UROBILINOGEN 0.2 10/30/2019 1516   UROBILINOGEN 0.2 07/30/2013 1305   NITRITE NEGATIVE 02/06/2020 2148   LEUKOCYTESUR NEGATIVE 02/06/2020 2148   LEUKOCYTESUR Negative 06/12/2014 2003   Sepsis Labs Invalid input(s): PROCALCITONIN,  WBC,  LACTICIDVEN Microbiology Recent Results (from the past 240 hour(s))  Respiratory Panel by RT PCR (Flu A&B, Covid) - Nasopharyngeal Swab     Status: Abnormal   Collection Time: 02/03/20  3:06 PM   Specimen: Nasopharyngeal Swab  Result Value Ref Range Status   SARS Coronavirus 2 by RT PCR POSITIVE (A) NEGATIVE Final    Comment: RESULT CALLED TO, READ BACK BY AND VERIFIED WITH: KIM MAIN RN AT 1700 ON 02/03/20 SNG (NOTE) SARS-CoV-2 target nucleic acids are DETECTED. SARS-CoV-2 RNA is generally  detectable in upper respiratory specimens  during the acute phase of infection. Positive results are indicative of the presence of the identified virus, but do not rule out bacterial infection or co-infection with other pathogens not detected by the test. Clinical correlation with patient history and other diagnostic information is necessary to determine patient infection status. The expected result is Negative. Fact Sheet for Patients:  PinkCheek.be Fact Sheet for Healthcare Providers: GravelBags.it This test is not yet approved or cleared by the [place] FDA and  has been authorized for detection and/or diagnosis of SARS-CoV-2 by FDA under an Emergency Use Authorization (EUA).  This EUA will remain in effect (meaning this test can be used) fo r the duration of  the COVID-19 declaration under Section 564(b)(1) of the Act, 21 U.S.C. section 360bbb-3(b)(1), unless the authorization is terminated or revoked sooner.    Influenza A by PCR NEGATIVE NEGATIVE Final   Influenza B by PCR NEGATIVE NEGATIVE Final    Comment: (NOTE) The Xpert Xpress SARS-CoV-2/FLU/RSV assay is intended as an aid in  the diagnosis of influenza from Nasopharyngeal swab specimens and  should not be used as a sole basis for treatment. Nasal washings and  aspirates are unacceptable for Xpert Xpress SARS-CoV-2/FLU/RSV  testing. Fact Sheet for Patients: PinkCheek.be Fact Sheet for Healthcare Providers: GravelBags.it This test is not yet approved or cleared by the [place] FDA and  has been authorized for detection and/or diagnosis of SARS-CoV-2 by  FDA under an Emergency Use Authorization (EUA). This EUA will remain  in effect (meaning this test can be used) for the duration of the  Covid-19 declaration under Section 564(b)(1) of the Act, 21  U.S.C. section 360bbb-3(b)(1), unless the  authorization is  terminated or revoked. Performed at [facility], [address] [place], [place] [address]   Blood culture (routine x 2)     Status: None   Collection Time: 02/03/20  5:58 PM   Specimen: BLOOD  Result Value Ref Range Status   Specimen Description BLOOD RAC  Final   Special Requests   Final    BOTTLES DRAWN AEROBIC AND ANAEROBIC Blood Culture adequate volume   Culture   Final    NO GROWTH 5 DAYS Performed at [facility], [address] [place], [place] [address]    Report Status 02/08/2020 FINAL  Final  Blood culture (routine x 2)     Status: None   Collection Time: 02/03/20  5:59 PM   Specimen: BLOOD  Result Value Ref Range Status   Specimen Description BLOOD RH  Final   Special Requests   Final  BOTTLES DRAWN AEROBIC AND ANAEROBIC Blood Culture adequate volume   Culture   Final    NO GROWTH 5 DAYS Performed at [facility], [address] [place], [place] [address]    Report Status 02/08/2020 FINAL  Final  MRSA PCR Screening     Status: None   Collection Time: 02/04/20  1:34 PM   Specimen: Nasopharyngeal  Result Value Ref Range Status   MRSA by PCR NEGATIVE NEGATIVE Final    Comment:        The GeneXpert MRSA Assay (FDA approved for NASAL specimens only), is one component of a comprehensive MRSA colonization surveillance program. It is not intended to diagnose MRSA infection nor to guide or monitor treatment for MRSA infections. Performed at [facility], [address], [place], [place] [address]      Time coordinating discharge: Over 30 minutes  SIGNED:   Sidney Ace, MD  [facility] 02/11/2020, 11:26 AM Pager   If 7PM-7AM, please contact night-coverage

## 2020-02-11 NOTE — Consult Note (Addendum)
PHARMACY CONSULT NOTE - FOLLOW UP  Pharmacy Consult for Electrolyte Monitoring and Replacement   Recent Labs: Potassium (mmol/L)  Date Value  02/11/2020 3.8  02/02/2015 3.9   Magnesium (mg/dL)  Date Value  02/10/2020 2.7 (H)   Calcium (mg/dL)  Date Value  02/10/2020 8.6 (L)   Calcium, Total (mg/dL)  Date Value  02/02/2015 8.7 (L)   Albumin (g/dL)  Date Value  02/08/2020 2.8 (L)  10/30/2019 4.1  06/12/2014 3.7   Phosphorus (mg/dL)  Date Value  02/10/2020 4.0   Sodium (mmol/L)  Date Value  02/10/2020 139  10/30/2019 141  02/02/2015 141     Assessment: Patient is a 64 y/o F admitted with respiratory failure secondary to COVID-19 pneumonia and AECOPD requiring HFNC. Pharmacy has been consulted for electrolyte monitoring and replacement.   Patient is on torsemide 40 mg BID ; UOP good. Last charted BM 4/16.   Patient is on digoxin 0.25 mg daily. Will target K of 4 and Mg of 2.    Goal of Therapy:  Potassium of 4 Magnesium of 2 Phosphorous within normal limits  Plan:  K 3.8    Since patient is on torsemide 77m BID and digoxin. Will restart patients home regimen of KCL 23m BID.    Will re-assess in w/ am labs in 48 hours   ShPernell DuprePharmD, BCPS Clinical Pharmacist 02/11/2020 7:57 AM

## 2020-02-11 NOTE — Progress Notes (Signed)
Placed on 5L [place] from CPAP

## 2020-02-11 NOTE — TOC Progression Note (Signed)
Transition of Care [facility]) - Progression Note    Patient Details  Name: Theresa [name] MRN: [id number] Date of Birth: 1956/03/28  [facility]) CM/SW Contact  Shelbie Hutching, RN Phone Number: 02/11/2020, 10:07 AM  Clinical Narrative:    Plan is for patient to discharge home with her husband and home health services today. [facility] chooses [facility] for home health services.  Tommi Rumps with [facility] given home health referral for PT and OT.  Patient would also like to switch her oxygen company from [place] to [place].  Caryl Pina with [employer] contacted and message left for him to return call.   [employer] offers a meal delivery program for members who have been discharged from the hospital.  This RNCM contacted [employer] and [place].  [employer] explained that once the patient is discharged from the hospital that she can call [employer] at [phone] and request meal delivery, she will need to provide her member ID and they will have the meals delivered in 1-2 days.       Expected Discharge Plan: [place] Barriers to Discharge: Barriers Resolved  Expected Discharge Plan and Services Expected Discharge Plan: [place]   Discharge Planning Services: CM Consult Post Acute Care Choice: Home Health, Durable Medical Equipment Living arrangements for the past 2 months: Mobile Home                   DME Agency: [facility] Date DME Agency Contacted: 02/11/20     HH Arranged: RN, PT, OT [place] Agency: [place] Date [facility] Agency Contacted: 02/11/20 Time Walker: [phone] Representative spoke with at [place]: [place]) Interventions    Readmission Risk Interventions Readmission Risk Prevention Plan 02/05/2020 07/13/2019  Transportation Screening Complete Complete  PCP or Specialist Appt within 5-7 Days - Complete  PCP or Specialist Appt within 3-5 Days Complete -  Home Care Screening - Complete  Medication  Review (RN CM) - Complete  HRI or Home Care Consult Complete -  Medication Review (RN Care Manager) Complete -  Some recent data might be hidden

## 2020-02-11 NOTE — Discharge Instructions (Signed)
COVID-19 Frequently Asked Questions COVID-19 (coronavirus disease) is an infection that is caused by a large family of viruses. Some viruses cause illness in people and others cause illness in animals like camels, cats, and bats. In some cases, the viruses that cause illness in animals can spread to humans. Where did the coronavirus come from? In December 2019, [place] told the [facility]) of several cases of lung disease (human respiratory illness). These cases were linked to an open seafood and livestock market in the city of [place]. The link to the seafood and livestock market suggests that the virus may have spread from animals to humans. However, since that first outbreak in December, the virus has also been shown to spread from person to person. What is the name of the disease and the virus? Disease name Early on, this disease was called novel coronavirus. This is because scientists determined that the disease was caused by a new (novel) respiratory virus. The World Health Organization [facility]) has now named the disease COVID-19, or coronavirus disease. Virus name The virus that causes the disease is called severe acute respiratory syndrome coronavirus 2 (SARS-CoV-2). More information on disease and virus naming World Health Organization [facility]): www.who.int/emergencies/diseases/novel-coronavirus-2019/technical-guidance/naming-the-coronavirus-disease-(covid-2019)-and-the-virus-that-causes-it Who is at risk for complications from coronavirus disease? Some people may be at higher risk for complications from coronavirus disease. This includes older adults and people who have chronic diseases, such as heart disease, diabetes, and lung disease. If you are at higher risk for complications, take these extra precautions:  Stay home as much as possible.  Avoid social gatherings and travel.  Avoid close contact with others. Stay at least 6 ft (2 m) away from others, if possible.  Wash  your hands often with soap and water for at least 20 seconds.  Avoid touching your face, mouth, nose, or eyes.  Keep supplies on hand at home, such as food, medicine, and cleaning supplies.  If you must go out in public, wear a cloth face covering or face mask. Make sure your mask covers your nose and mouth. How does coronavirus disease spread? The virus that causes coronavirus disease spreads easily from person to person (is contagious). You may catch the virus by:  Breathing in droplets from an infected person. Droplets can be spread by a person breathing, speaking, singing, coughing, or sneezing.  Touching something, like a table or a doorknob, that was exposed to the virus (contaminated) and then touching your mouth, nose, or eyes. Can I get the virus from touching surfaces or objects? There is still a lot that we do not know about the virus that causes coronavirus disease. Scientists are basing a lot of information on what they know about similar viruses, such as:  Viruses cannot generally survive on surfaces for long. They need a human body (host) to survive.  It is more likely that the virus is spread by close contact with people who are sick (direct contact), such as through: ? Shaking hands or hugging. ? Breathing in respiratory droplets that travel through the air. Droplets can be spread by a person breathing, speaking, singing, coughing, or sneezing.  It is less likely that the virus is spread when a person touches a surface or object that has the virus on it (indirect contact). The virus may be able to enter the body if the person touches a surface or object and then touches his or her face, eyes, nose, or mouth. Can a person spread the virus without having symptoms of the disease?  It may be possible for the virus to spread before a person has symptoms of the disease, but this is most likely not the main way the virus is spreading. It is more likely for the virus to spread by  being in close contact with people who are sick and breathing in the respiratory droplets spread by a person breathing, speaking, singing, coughing, or sneezing. What are the symptoms of coronavirus disease? Symptoms vary from person to person and can range from mild to severe. Symptoms may include:  Fever or chills.  Cough.  Difficulty breathing or feeling short of breath.  Headaches, body aches, or muscle aches.  Runny or stuffy (congested) nose.  Sore throat.  New loss of taste or smell.  Nausea, vomiting, or diarrhea. These symptoms can appear anywhere from 2 to 14 days after you have been exposed to the virus. Some people may not have any symptoms. If you develop symptoms, call your health care provider. People with severe symptoms may need hospital care. Should I be tested for this virus? Your health care provider will decide whether to test you based on your symptoms, history of exposure, and your risk factors. How does a health care provider test for this virus? Health care providers will collect samples to send for testing. Samples may include:  Taking a swab of fluid from the back of your nose and throat, your nose, or your throat.  Taking fluid from the lungs by having you cough up mucus (sputum) into a sterile cup.  Taking a blood sample. Is there a treatment or vaccine for this virus? Currently, there is no vaccine to prevent coronavirus disease. Also, there are no medicines like antibiotics or antivirals to treat the virus. A person who becomes sick is given supportive care, which means rest and fluids. A person may also relieve his or her symptoms by using over-the-counter medicines that treat sneezing, coughing, and runny nose. These are the same medicines that a person takes for the common cold. If you develop symptoms, call your health care provider. People with severe symptoms may need hospital care. What can I do to protect myself and my family from this  virus?     You can protect yourself and your family by taking the same actions that you would take to prevent the spread of other viruses. Take the following actions:  Wash your hands often with soap and water for at least 20 seconds. If soap and water are not available, use alcohol-based hand sanitizer.  Avoid touching your face, mouth, nose, or eyes.  Cough or sneeze into a tissue, sleeve, or elbow. Do not cough or sneeze into your hand or the air. ? If you cough or sneeze into a tissue, throw it away immediately and wash your hands.  Disinfect objects and surfaces that you frequently touch every day.  Stay away from people who are sick.  Avoid going out in public, follow guidance from your state and local health authorities.  Avoid crowded indoor spaces. Stay at least 6 ft (2 m) away from others.  If you must go out in public, wear a cloth face covering or face mask. Make sure your mask covers your nose and mouth.  Stay home if you are sick, except to get medical care. Call your health care provider before you get medical care. Your health care provider will tell you how long to stay home.  Make sure your vaccines are up to date. Ask your health care provider what  vaccines you need. What should I do if I need to travel? Follow travel recommendations from your local health authority, the CDC, and WHO. Travel information and advice  Centers for Disease Control and Prevention (CDC): BodyEditor.hu  World Health Organization [facility]): ThirdIncome.ca Know the risks and take action to protect your health  You are at higher risk of getting coronavirus disease if you are traveling to areas with an outbreak or if you are exposed to travelers from areas with an outbreak.  Wash your hands often and practice good hygiene to lower the risk of catching or spreading the virus. What should I do if I  am sick? General instructions to stop the spread of infection  Wash your hands often with soap and water for at least 20 seconds. If soap and water are not available, use alcohol-based hand sanitizer.  Cough or sneeze into a tissue, sleeve, or elbow. Do not cough or sneeze into your hand or the air.  If you cough or sneeze into a tissue, throw it away immediately and wash your hands.  Stay home unless you must get medical care. Call your health care provider or local health authority before you get medical care.  Avoid public areas. Do not take public transportation, if possible.  If you can, wear a mask if you must go out of the house or if you are in close contact with someone who is not sick. Make sure your mask covers your nose and mouth. Keep your home clean  Disinfect objects and surfaces that are frequently touched every day. This may include: ? Counters and tables. ? Doorknobs and light switches. ? Sinks and faucets. ? Electronics such as phones, remote controls, keyboards, computers, and tablets.  Wash dishes in hot, soapy water or use a dishwasher. Air-dry your dishes.  Wash laundry in hot water. Prevent infecting other household members  Let healthy household members care for children and pets, if possible. If you have to care for children or pets, wash your hands often and wear a mask.  Sleep in a different bedroom or bed, if possible.  Do not share personal items, such as razors, toothbrushes, deodorant, combs, brushes, towels, and washcloths. Where to find more information Centers for Disease Control and Prevention (CDC)  Information and news updates: https://www.butler-gonzalez.com/ World Health Organization [facility])  Information and news updates: MissExecutive.com.ee  Coronavirus health topic: https://www.castaneda.info/  Questions and answers on COVID-19: OpportunityDebt.at  Global  tracker: who.sprinklr.com American Academy of Pediatrics (AAP)  Information for families: www.healthychildren.org/English/health-issues/conditions/chest-lungs/Pages/2019-Novel-Coronavirus.aspx The coronavirus situation is changing rapidly. Check your local health authority website or the CDC and [facility] websites for updates and news. When should I contact a health care provider?  Contact your health care provider if you have symptoms of an infection, such as fever or cough, and you: ? Have been near anyone who is known to have coronavirus disease. ? Have come into contact with a person who is suspected to have coronavirus disease. ? Have traveled to an area where there is an outbreak of COVID-19. When should I get emergency medical care?  Get help right away by calling your local emergency services (911 in the [place] if you have: ? Trouble breathing. ? Pain or pressure in your chest. ? Confusion. ? Blue-tinged lips and fingernails. ? Difficulty waking from sleep. ? Symptoms that get worse. Let the emergency medical personnel know if you think you have coronavirus disease. Summary  A new respiratory virus is spreading from person to person and causing  COVID-19 (coronavirus disease).  The virus that causes COVID-19 appears to spread easily. It spreads from one person to another through droplets from breathing, speaking, singing, coughing, or sneezing.  Older adults and those with chronic diseases are at higher risk of disease. If you are at higher risk for complications, take extra precautions.  There is currently no vaccine to prevent coronavirus disease. There are no medicines, such as antibiotics or antivirals, to treat the virus.  You can protect yourself and your family by washing your hands often, avoiding touching your face, and covering your coughs and sneezes. This information is not intended to replace advice given to you by your health care provider. Make sure you discuss any  questions you have with your health care provider. Document Revised: 08/02/2019 Document Reviewed: 01/29/2019 Elsevier Patient Education  [place].

## 2020-02-12 ENCOUNTER — Telehealth: Payer: Self-pay

## 2020-02-12 NOTE — Telephone Encounter (Signed)
Transition Care Management Follow-up Telephone Call  Date of discharge and from where: [facility] on 02/11/20.  How have you been since you were released from the hospital? Doing ok, feels about the same and is still weak. Pt is still coughing and seeing a thick yellowish green sputum. Oxygen has been running good, last reading was 98. Pt is using her oxygen at 2.5L at all times. Declines fever, SOB, new pain or n/v/d.  Any questions or concerns? No  Items Reviewed:  Did the pt receive and understand the discharge instructions provided? Yes   Medications obtained and verified? No, pt states she will verify these with PCP at [facility].  Any new allergies since your discharge? No   Dietary orders reviewed? Yes  Do you have support at home? Yes   Other (ie: DME, Home Health, etc): N/A  Functional Questionnaire: (I = Independent and D = Dependent)  Bathing/Dressing- I   Meal Prep- I  Eating- I  Maintaining continence- I  Transferring/Ambulation- I, though husband is going to get pt a walker.  Managing Meds- I   Follow up appointments reviewed:    PCP Hospital f/u appt confirmed? Yes , scheduled to see [name] on 02/18/20 @ 9:20 AM. (Need to confirm)  [facility] f/u appt confirmed? N/A  Are transportation arrangements needed? No   If their condition worsens, is the pt aware to call  their PCP or go to the ED? Yes  Was the patient provided with contact information for the PCP's office or ED? Yes  Was the pt encouraged to call back with questions or concerns? Yes

## 2020-02-12 NOTE — Telephone Encounter (Signed)
HFU scheduled 5/4/2 @ 9:20 AM.

## 2020-02-12 NOTE — Telephone Encounter (Signed)
I have made the 1st attempt to contact the patient or family member in charge, in order to follow up from recently being discharged from the hospital. I left a message on voicemail requesting a CB. -MM

## 2020-02-13 ENCOUNTER — Telehealth: Payer: Self-pay

## 2020-02-13 DIAGNOSIS — E876 Hypokalemia: Secondary | ICD-10-CM | POA: Diagnosis not present

## 2020-02-13 DIAGNOSIS — J1282 Pneumonia due to coronavirus disease 2019: Secondary | ICD-10-CM | POA: Diagnosis not present

## 2020-02-13 DIAGNOSIS — J9621 Acute and chronic respiratory failure with hypoxia: Secondary | ICD-10-CM | POA: Diagnosis not present

## 2020-02-13 DIAGNOSIS — R739 Hyperglycemia, unspecified: Secondary | ICD-10-CM | POA: Diagnosis not present

## 2020-02-13 DIAGNOSIS — U071 COVID-19: Secondary | ICD-10-CM | POA: Diagnosis not present

## 2020-02-13 DIAGNOSIS — I5033 Acute on chronic diastolic (congestive) heart failure: Secondary | ICD-10-CM | POA: Diagnosis not present

## 2020-02-13 DIAGNOSIS — F419 Anxiety disorder, unspecified: Secondary | ICD-10-CM | POA: Diagnosis not present

## 2020-02-13 DIAGNOSIS — J440 Chronic obstructive pulmonary disease with acute lower respiratory infection: Secondary | ICD-10-CM | POA: Diagnosis not present

## 2020-02-13 DIAGNOSIS — I110 Hypertensive heart disease with heart failure: Secondary | ICD-10-CM | POA: Diagnosis not present

## 2020-02-13 NOTE — Telephone Encounter (Signed)
Copied from [place] [id number]. Topic: General - Inquiry >> Feb 12, 2020  4:27 PM Virl Axe D wrote: Reason for CRM: Skeet Simmer with [facility] is requesting verbal orders to reschedule Start of Care visit for pt for tomorrow. She was unavailable to be seen today.

## 2020-02-13 NOTE — Telephone Encounter (Signed)
May reschedule Start of Care visit for tomorrow.

## 2020-02-13 NOTE — Telephone Encounter (Signed)
Advised 

## 2020-02-13 NOTE — Telephone Encounter (Signed)
Would keep appointment.

## 2020-02-14 ENCOUNTER — Telehealth: Payer: Self-pay | Admitting: Family Medicine

## 2020-02-14 DIAGNOSIS — I5033 Acute on chronic diastolic (congestive) heart failure: Secondary | ICD-10-CM | POA: Diagnosis not present

## 2020-02-14 DIAGNOSIS — J1282 Pneumonia due to coronavirus disease 2019: Secondary | ICD-10-CM | POA: Diagnosis not present

## 2020-02-14 DIAGNOSIS — I110 Hypertensive heart disease with heart failure: Secondary | ICD-10-CM | POA: Diagnosis not present

## 2020-02-14 DIAGNOSIS — R739 Hyperglycemia, unspecified: Secondary | ICD-10-CM | POA: Diagnosis not present

## 2020-02-14 DIAGNOSIS — J9621 Acute and chronic respiratory failure with hypoxia: Secondary | ICD-10-CM | POA: Diagnosis not present

## 2020-02-14 DIAGNOSIS — E876 Hypokalemia: Secondary | ICD-10-CM | POA: Diagnosis not present

## 2020-02-14 DIAGNOSIS — F419 Anxiety disorder, unspecified: Secondary | ICD-10-CM | POA: Diagnosis not present

## 2020-02-14 DIAGNOSIS — U071 COVID-19: Secondary | ICD-10-CM | POA: Diagnosis not present

## 2020-02-14 DIAGNOSIS — J440 Chronic obstructive pulmonary disease with acute lower respiratory infection: Secondary | ICD-10-CM | POA: Diagnosis not present

## 2020-02-14 NOTE — Progress Notes (Signed)
Established patient visit   Patient: Theresa [name]   DOB: Nov 09, 1955   64 y.o. Female  MRN: [id number]   Today's healthcare provider: [name], PA   No chief complaint on file.  Subjective    HPI  The patient is a 64 year old female who presents today for hospital hollow up    Hospital discharge summary included the following information:  Admit date: 02/03/2020 Discharge date: 02/11/2020  Admitted From: Home Disposition: Home with home health  Recommendations for Outpatient Follow-up:  1. Follow up with PCP in 1-2 weeks  Pneumonia due to COVID-19 virus Acute on chronic hypoxic resp failure-Present on admission, Due to COVID pneumonia. Patient required BiPAP while in-house Was able to wean to 2 to 2-1/2 L nasal cannula This is her home rate Patient completed remdesivir 5-day course in house Patient completed ivermectin 6-day course in house Will complete 10-day course of steroids Resume home oxygen at home rate of 2.5 L on discharge Outpatient PCP follow-up  Medication changes at the time of discharge: Added: Ascorbic Acid  Senokot Dexamethasone (3 days) Zinc Sulfate Valcyclovir-(patient had hip pain that she thought may be Shingles, at the time of hospital exam no lesions were noted)  Virtual Visit via Video Note  I connected with [name] on 02/18/20 at  9:20 AM EDT by a video enabled telemedicine application and verified that I am speaking with the correct person using two identifiers.  Location: Patient: home Provider: office   I discussed the limitations of evaluation and management by telemedicine and the availability of in person appointments. The patient expressed understanding and agreed to proceed.   Patient Active Problem List   Diagnosis Date Noted  . Pneumonia due to COVID-19 virus 02/03/2020  . Numbness and tingling in right hand 12/31/2019  . Numbness in feet 12/31/2019  . Altered consciousness 11/26/2019  . Frequent  falls 11/26/2019  . Weakness of both lower extremities 11/26/2019  . Protruded lumbar disc (L2-3) (Left) 09/10/2019  . Chronic knee pain (Left) 08/14/2019  . Chronic lumbar radiculopathy/radiculitis (L2) (Left) 08/14/2019  . Abnormal MRI, lumbar spine (07/23/2019) 08/14/2019  . Acute on chronic respiratory failure with hypoxia ([place]) 07/06/2019  . Cervicalgia (Bilateral) (R>L) 09/24/2018  . Spondylosis without myelopathy or radiculopathy, lumbosacral region 09/24/2018  . Epigastric hernia 05/04/2018  . Chronic lower extremity pain (Bilateral) (L>R) 01/25/2018  . Altered mental status, unspecified 12/09/2017  . Chronic hip pain [facility]) (Bilateral) (L>R) 11/27/2017  . COPD (chronic obstructive pulmonary disease) ([place]) 08/11/2017  . Other long term (current) drug therapy 08/08/2017  . Disorder of bone, unspecified 08/08/2017  . COPD with hypoxia ([place]) 06/27/2017  . Arthralgia of acromioclavicular joint (Right) 06/15/2017  . Acromioclavicular joint DJD (Right) 06/15/2017  . 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  . Chronic shoulder pain (Left) 05/11/2017  . Elevated brain natriuretic peptide (BNP) level 05/11/2017  . Grief at loss of child 02/14/2017  . Lumbar facet hypertrophy (multilevel) 11/17/2016  . Lumbar spinal stenosis (L4-5) 11/17/2016  . 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) 11/17/2016  . 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 Lumbosacral radiculopathy at L4 11/17/2016  . Supplemental oxygen dependent 08/01/2016  . Sleep apnea 06/02/2016  . Long term current use of opiate analgesic 01/12/2016  . Long term prescription opiate use 01/12/2016  . Opiate use (15 MME/Day) 01/12/2016  . Lumbar facet syndrome (Bilateral) (L>R) 01/12/2016  . Chronic sacroiliac joint pain (Bilateral) 01/12/2016  . Vitamin D  deficiency 12/30/2015  . 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 Obesity, Class III, BMI 40-49.9 (morbid  obesity) ([place]) 12/28/2015  . 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[place]) 11/19/2015  . Coagulation disorder ([place]) 10/22/2015  . Chronic pain syndrome (significant psychosocial component) 09/21/2015  . 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 09/21/2015  . Encounter for therapeutic drug level monitoring 09/16/2015  . Encounter for chronic pain management 09/16/2015  . Pain management 09/16/2015  . Neurogenic pain 09/16/2015  . Neuropathic pain 09/16/2015  . Musculoskeletal pain 09/16/2015  . Lumbar spondylosis (L4-5) 09/16/2015  . Chronic low back pain (Primary Area of Pain) (Bilateral) (L>R) 09/16/2015  . Chronic lower extremity pain (Secondary Area of Pain) (Left) 09/16/2015  . Chronic lumbar radicular pain (L4 & S1 Dermatome) (Left) 09/16/2015  . Chronic shoulder pain (Right side) 09/16/2015  . Chronic upper extremity pain (Right-sided) 09/16/2015  . Cervical radiculitis (Right side) 09/16/2015  . Abnormal x-ray of lumbar spine 09/16/2015  . Chronic diastolic heart failure ([place]) 07/15/2015  . Chest pain 07/15/2015  . Tobacco abuse 07/15/2015  . Blood clotting disorder ([place]) 04/21/2015  . Decreased motor strength 07/16/2014  . Clinical depression 07/16/2014  . Chronic rhinitis 07/16/2014  . Carpal tunnel syndrome 07/16/2014  . Anxiety state 07/16/2014  . Neuritis or radiculitis due to rupture of lumbar intervertebral disc 07/02/2014  . DDD (degenerative disc disease), lumbar 07/02/2014  . CAFL (chronic airflow limitation) ([place]) 02/27/2014  . Essential (primary) hypertension 03/27/2008   Past Medical History:  Diagnosis Date  . Acute postoperative pain 10/03/2017  . Anxiety   . BiPAP (biphasic positive airway pressure) dependence    at hs  . Carpal tunnel syndrome   . CHF (congestive heart failure) ([place])    1/18  . CHF (congestive heart failure) ([place])   . Chronic generalized abdominal pain   . Chronic rhinitis   . COPD (chronic obstructive  pulmonary disease) ([place])   . DDD (degenerative disc disease), cervical   . DDD (degenerative disc disease), lumbosacral   . Depression   . Dyspnea   . Edema   . Flu    1/18  . Gastritis   . GERD (gastroesophageal reflux disease)   . Hematuria   . Hernia of abdominal wall 07/17/2018  . Hernia, abdominal   . Hypertension   . Kidney stones   . Low back pain   . Lumbar radiculitis   . Malodorous urine   . Muscle weakness   . Obesity   . Oxygen dependent   . Renal cyst   . Sensory urge incontinence   . Thyroid activity decreased    9/19  . Tobacco abuse   . Wheezing    Past Surgical History:  Procedure Laterality Date  . ABDOMINAL HYSTERECTOMY    . CARPAL TUNNEL RELEASE Left 2012  . EPIGASTRIC HERNIA REPAIR N/A 08/13/2018   HERNIA REPAIR EPIGASTRIC ADULT; polypropylene mesh reinforcement.  Surgeon: Robert Bellow, MD;  Location: [facility] ORS;  Service: General;  Laterality: N/A;  . RIGHT/LEFT HEART CATH AND CORONARY ANGIOGRAPHY N/A 07/11/2019   Procedure: RIGHT/LEFT HEART CATH AND CORONARY ANGIOGRAPHY;  Surgeon: Yolonda Kida, MD;  Location: [facility];  Service: Cardiovascular;  Laterality: N/A;  . TONSILLECTOMY    . TUBAL LIGATION    . UMBILICAL HERNIA REPAIR N/A 08/13/2018   HERNIA REPAIR UMBILICAL ADULT; polypropylene mesh reinforcement Surgeon: Robert Bellow, MD;  Location: [facility] ORS;  Service: General;  Laterality: N/A;   Social History  Tobacco Use  . Smoking status: Current Every Day Smoker    Packs/day: 0.50    Years: 44.00    Pack years: 22.00    Types: Cigarettes  . Smokeless tobacco: Never Used  . Tobacco comment: over a half pack  Substance Use Topics  . Alcohol use: No  . Drug use: No   Family Status  Relation Name Status  . Mother  Deceased  . Sister  Deceased  . Father  Deceased  . Brother  Deceased  . Brother  Deceased  . Brother  Deceased  . Neg Hx  (Not Specified)   Family History  Problem Relation Age of Onset  .  Heart disease Mother   . Stroke Mother   . Coronary artery disease Mother   . Lung cancer Sister   . Cancer Sister   . Breast cancer Sister   . Alcohol abuse Father   . Heart disease Father   . Cancer Brother   . Cancer Brother   . Pneumonia Brother   . Prostate cancer Neg Hx   . Kidney cancer Neg Hx   . Bladder Cancer Neg Hx       Allergies  Allergen Reactions  . Codeine Nausea And Vomiting  . Meloxicam Other (See Comments)    Stomach pain   Medications: Outpatient Medications Prior to Visit  Medication Sig  . albuterol (VENTOLIN HFA) 108 (90 Base) MCG/ACT inhaler Inhale 2 puffs into the lungs every 6 (six) hours as needed for wheezing or shortness of breath.  Marland Kitchen ascorbic acid (VITAMIN C) 250 MG tablet Take 1 tablet (250 mg total) by mouth daily for 7 days.  Marland Kitchen aspirin EC 81 MG tablet Take 81 mg by mouth daily.  . baclofen (LIORESAL) 10 MG tablet Take 1 tablet (10 mg total) by mouth 3 (three) times daily.  . bisoprolol-hydrochlorothiazide (ZIAC) 10-6.25 MG tablet TAKE 1 TABLET BY MOUTH DAILY (Patient not taking: Reported on 02/04/2020)  . Cholecalciferol ([employer] D3) 2000 units CAPS Take 2,000 Units by mouth daily.   Marland Kitchen dexamethasone (DECADRON) 6 MG tablet Take 1 tablet (6 mg total) by mouth daily for 3 days.  . digoxin (LANOXIN) 0.25 MG tablet TAKE 1 TABLET(0.25 MG) BY MOUTH DAILY  . Fluticasone-Umeclidin-Vilant 100-62.5-25 MCG/INH AEPB Inhale 1 puff into the lungs daily.  Marland Kitchen gabapentin (NEURONTIN) 400 MG capsule Take 1 capsule (400 mg total) by mouth 3 (three) times daily.  Marland Kitchen HYDROcodone-acetaminophen (NORCO/VICODIN) 5-325 MG tablet Take 1 tablet by mouth every 8 (eight) hours as needed for severe pain. Must last 30 days  . ipratropium-albuterol (DUONEB) 0.5-2.5 (3) MG/3ML SOLN USE 1 VIAL VIA NEBULIZER EVERY 6 HOURS AS NEEDED FOR SHORTNESS OF BREATH OR WHEEZING  . levothyroxine (SYNTHROID) 25 MCG tablet TAKE 1 TABLET(25 MCG) BY MOUTH DAILY BEFORE BREAKFAST (Patient not taking: Reported  on 01/22/2020)  . potassium chloride SA (KLOR-CON) 20 MEQ tablet TAKE 1 TABLET(20 MEQ) BY MOUTH TWICE DAILY  . senna-docusate (SENOKOT-S) 8.6-50 MG tablet Take 1 tablet by mouth 2 (two) times daily for 7 days.  Marland Kitchen sertraline (ZOLOFT) 100 MG tablet TAKE 2 TABLETS BY MOUTH EVERY MORNING  . torsemide (DEMADEX) 20 MG tablet TAKE 2 TABLETS(40 MG) BY MOUTH TWICE DAILY (Patient taking differently: Take 40 mg by mouth 2 (two) times daily. )  . valACYclovir (VALTREX) 1000 MG tablet Take 1 tablet (1,000 mg total) by mouth 3 (three) times daily for 5 days.  Marland Kitchen zinc sulfate 220 (50 Zn) MG capsule Take 1 capsule (220  mg total) by mouth daily for 7 days.  Marland Kitchen zolpidem (AMBIEN) 5 MG tablet Take 1 tablet (5 mg total) by mouth at bedtime as needed for up to 14 days for sleep.   No facility-administered medications prior to visit.   Review of Systems  Constitutional: Positive for fatigue. Negative for chills and fever.  HENT: Negative.   Respiratory: Positive for shortness of breath.   Cardiovascular: Negative.   Gastrointestinal: Negative.   Musculoskeletal: Positive for back pain.      Objective    There were no vitals taken for this visit.   Physical Exam: WDWN female in no acute distress.  Head: Normocephalic, atraumatic. Neck: Supple, NROM Respiratory: No acute distress. Wearing nasal cannula for supplemental oxygen. Psych: Normal mood and affect Skin: Yellow bruise on her forehead and a scrape on the bridge of her nose.    Assessment & Plan    1. Weakness Hospitalized 02-03-20 through 02-10-18 with diarrhea, vomiting and pneumonia secondary to COVID-19 infection. Had significant shortness of breath, tachypnea and O2 sats in the 80's requiring BiPAP on admission to the ER. - CBC with Differential/Platelet; Future - Comprehensive metabolic panel; Future - TSH; Future  2. Hypokalemia Potassium down to 3.0 on admission and rose to 4.1 after IV replacement. With continued weakness, will recheck  labs. - Comprehensive metabolic panel; Future  3. History of COVID-19 Symptoms started with diarrhea and vomiting but progressed to respiratory failure requiring high flow oxygen with multifocal interstitial airspace opacity infection on CXR. Completed Remdesivir 5 day, 6 day Ivermectin and 10 day steroid courses. - CBC with Differential/Platelet; Future - Comprehensive metabolic panel; Future  4. Chronic diastolic heart failure (HCC) Flare during admission for pneumonia from [place]. Scheduled to be rechecked by Darylene Price, FNP ([facility]) on 02-20-20 and Dr. Clayborn Bigness (cardiologist) in 3-4 weeks. Recheck follow up labs and continue present medications.  - CBC with Differential/Platelet; Future - Comprehensive metabolic panel; Future - TSH; Future  5. COPD with hypoxia ([place]) Still on the 2-2.5 LPM oxygen by nasal cannula and back on Trelegy 1 puff qd with Albuterol prn. Has a follow up appointment scheduled with Dr. Raul Del this month.  - CBC with Differential/Platelet; Future  6. Hypothyroidism, unspecified type Tolerating levothyroxine 25 mcg qd. Need to recheck follow up labs. - CBC with Differential/Platelet; Future - Comprehensive metabolic panel; Future - T4; Future - TSH; Future  7. Right groin pain Developed a burning pain int he right groin during her hospitalization and has had slow improvement with Valtrex. No obvious rash seen by patient. May need in office evaluation if no better in the next 10 days. May use Aspercreme with Lidocaine or Capsaicin cream.  I discussed the assessment and treatment plan with the patient. The patient was provided an opportunity to ask questions and all were answered. The patient agreed with the plan and demonstrated an understanding of the instructions.   The patient was advised to call back or seek an in-person evaluation if the symptoms worsen or if the condition fails to improve as anticipated.  I provided 30 minutes of non-face-to-face time  during this encounter.    No follow-ups on file.      Andres Shad, PA, have reviewed all documentation for this visit. The documentation on 02/18/20 for the exam, diagnosis, procedures, and orders are all accurate and complete.    [name], [place] [phone] (phone) [phone] (fax)  [place]

## 2020-02-14 NOTE — Telephone Encounter (Signed)
Please advise?

## 2020-02-14 NOTE — Telephone Encounter (Signed)
Home Health Verbal Orders -  Caller/Agency: [facility] Callback Number: [phone] Requesting PT 1 week 1 2 wek 2  and 1 week 2

## 2020-02-16 NOTE — Telephone Encounter (Signed)
Approve above verbal order for home health orders post hospitalization of patient with pneumonia due to COVID-19 infection, COPD with hypoxia and chronic diastolic heart failure.

## 2020-02-18 ENCOUNTER — Encounter (INDEPENDENT_AMBULATORY_CARE_PROVIDER_SITE_OTHER): Payer: Self-pay

## 2020-02-18 ENCOUNTER — Encounter: Payer: Self-pay | Admitting: Family Medicine

## 2020-02-18 ENCOUNTER — Telehealth (INDEPENDENT_AMBULATORY_CARE_PROVIDER_SITE_OTHER): Payer: Medicare HMO | Admitting: Family Medicine

## 2020-02-18 DIAGNOSIS — R531 Weakness: Secondary | ICD-10-CM | POA: Diagnosis not present

## 2020-02-18 DIAGNOSIS — R1031 Right lower quadrant pain: Secondary | ICD-10-CM

## 2020-02-18 DIAGNOSIS — I5032 Chronic diastolic (congestive) heart failure: Secondary | ICD-10-CM

## 2020-02-18 DIAGNOSIS — F419 Anxiety disorder, unspecified: Secondary | ICD-10-CM | POA: Diagnosis not present

## 2020-02-18 DIAGNOSIS — Z8616 Personal history of COVID-19: Secondary | ICD-10-CM

## 2020-02-18 DIAGNOSIS — U071 COVID-19: Secondary | ICD-10-CM | POA: Diagnosis not present

## 2020-02-18 DIAGNOSIS — J449 Chronic obstructive pulmonary disease, unspecified: Secondary | ICD-10-CM | POA: Diagnosis not present

## 2020-02-18 DIAGNOSIS — I5033 Acute on chronic diastolic (congestive) heart failure: Secondary | ICD-10-CM | POA: Diagnosis not present

## 2020-02-18 DIAGNOSIS — E876 Hypokalemia: Secondary | ICD-10-CM | POA: Diagnosis not present

## 2020-02-18 DIAGNOSIS — R739 Hyperglycemia, unspecified: Secondary | ICD-10-CM | POA: Diagnosis not present

## 2020-02-18 DIAGNOSIS — J9621 Acute and chronic respiratory failure with hypoxia: Secondary | ICD-10-CM | POA: Diagnosis not present

## 2020-02-18 DIAGNOSIS — R0902 Hypoxemia: Secondary | ICD-10-CM | POA: Diagnosis not present

## 2020-02-18 DIAGNOSIS — E039 Hypothyroidism, unspecified: Secondary | ICD-10-CM | POA: Diagnosis not present

## 2020-02-18 DIAGNOSIS — I110 Hypertensive heart disease with heart failure: Secondary | ICD-10-CM | POA: Diagnosis not present

## 2020-02-18 DIAGNOSIS — J440 Chronic obstructive pulmonary disease with acute lower respiratory infection: Secondary | ICD-10-CM | POA: Diagnosis not present

## 2020-02-18 DIAGNOSIS — J1282 Pneumonia due to coronavirus disease 2019: Secondary | ICD-10-CM | POA: Diagnosis not present

## 2020-02-19 ENCOUNTER — Telehealth: Payer: Self-pay | Admitting: Family

## 2020-02-19 NOTE — Telephone Encounter (Signed)
Virtual Visit Pre-Appointment Phone Call  "Theresa [name], I am calling you today to discuss your upcoming appointment. We are currently trying to limit exposure to the virus that causes COVID-19 by seeing patients at home rather than in the office."  1. "What is the BEST phone number to call the day of the visit?" - include this in appointment notes  2. "Do you have or have access to (through a family member/friend) a smartphone with video capability that we can use for your visit?" a. If yes - list this number in appt notes as "cell" (if different from BEST phone #) and list the appointment type as a VIDEO visit in appointment notes b. If no - list the appointment type as a PHONE visit in appointment notes  3. Confirm consent - "In the setting of the current Covid19 crisis, you are scheduled for a (phone or video) visit with your provider on (date) at (time).  Just as we do with many in-office visits, in order for you to participate in this visit, we must obtain consent.  If you'd like, I can send this to your mychart (if signed up) or email for you to review.  Otherwise, I can obtain your verbal consent now.  All virtual visits are billed to your insurance company just like a normal visit would be.  By agreeing to a virtual visit, we'd like you to understand that the technology does not allow for your provider to perform an examination, and thus may limit your provider's ability to fully assess your condition. If your provider identifies any concerns that need to be evaluated in person, we will make arrangements to do so.  Finally, though the technology is pretty good, we cannot assure that it will always work on either your or our end, and in the setting of a video visit, we may have to convert it to a phone-only visit.  In either situation, we cannot ensure that we have a secure connection.  Are you willing to proceed?" STAFF: Did the patient verbally acknowledge consent to telehealth visit? Document  YES/NO here: yes  4. Advise patient to be prepared - "Two hours prior to your appointment, go ahead and check your blood pressure, pulse, oxygen saturation, and your weight (if you have the equipment to check those) and write them all down. When your visit starts, your provider will ask you for this information. If you have an Apple Watch or Kardia device, please plan to have heart rate information ready on the day of your appointment. Please have a pen and paper handy nearby the day of the visit as well."  5. Give patient instructions for MyChart download to smartphone OR Doximity/Doxy.me as below if video visit (depending on what platform provider is using)  6. Inform patient they will receive a phone call 15 minutes prior to their appointment time (may be from unknown caller ID) so they should be prepared to answer    TELEPHONE CALL NOTE  Theresa [name] has been deemed a candidate for a follow-up tele-health visit to limit community exposure during the Covid-19 pandemic. I spoke with the patient via phone to ensure availability of phone/video source, confirm preferred email & phone number, and discuss instructions and expectations.  I reminded Theresa [name] to be prepared with any vital sign and/or heart rhythm information that could potentially be obtained via home monitoring, at the time of her visit. I reminded Theresa [name] to expect a phone call prior to her visit.  Theresa [name], [place] 02/19/2020 8:42 AM   INSTRUCTIONS FOR DOWNLOADING THE MYCHART APP TO SMARTPHONE  - The patient must first make sure to have activated MyChart and know their login information - If Apple, go to [employer] and type in MyChart in the search bar and download the app. If Android, ask patient to go to [employer] and type in Kinder in the search bar and download the app. The app is free but as with any other app downloads, their phone may require them to verify saved payment information or Apple/Android  password.  - The patient will need to then log into the app with their MyChart username and password, and select [facility] as their healthcare provider to link the account. When it is time for your visit, go to the MyChart app, find appointments, and click Begin Video Visit. Be sure to Select Allow for your device to access the Microphone and Camera for your visit. You will then be connected, and your provider will be with you shortly.  **If they have any issues connecting, or need assistance please contact MyChart service desk [phone])**  **If using a computer, in order to ensure the best quality for their visit they will need to use either of the following Internet Browsers: [employer], or Google Chrome**  IF USING DOXIMITY or DOXY.ME - The patient will receive a link just prior to their visit by text.     FULL LENGTH CONSENT FOR TELE-HEALTH VISIT   I hereby voluntarily request, consent and authorize [facility] and its employed or contracted physicians, physician assistants, nurse practitioners or other licensed health care professionals (the Practitioner), to provide me with telemedicine health care services (the "Services") as deemed necessary by the treating Practitioner. I acknowledge and consent to receive the Services by the Practitioner via telemedicine. I understand that the telemedicine visit will involve communicating with the Practitioner through live audiovisual communication technology and the disclosure of certain medical information by electronic transmission. I acknowledge that I have been given the opportunity to request an in-person assessment or other available alternative prior to the telemedicine visit and am voluntarily participating in the telemedicine visit.  I understand that I have the right to withhold or withdraw my consent to the use of telemedicine in the course of my care at any time, without affecting my right to future care or treatment, and  that the Practitioner or I may terminate the telemedicine visit at any time. I understand that I have the right to inspect all information obtained and/or recorded in the course of the telemedicine visit and may receive copies of available information for a reasonable fee.  I understand that some of the potential risks of receiving the Services via telemedicine include:  Marland Kitchen Delay or interruption in medical evaluation due to technological equipment failure or disruption; . Information transmitted may not be sufficient (e.g. poor resolution of images) to allow for appropriate medical decision making by the Practitioner; and/or  . In rare instances, security protocols could fail, causing a breach of personal health information.  Furthermore, I acknowledge that it is my responsibility to provide information about my medical history, conditions and care that is complete and accurate to the best of my ability. I acknowledge that Practitioner's advice, recommendations, and/or decision may be based on factors not within their control, such as incomplete or inaccurate data provided by me or distortions of diagnostic images or specimens that may result from electronic transmissions. I understand that  the practice of medicine is not an exact science and that Practitioner makes no warranties or guarantees regarding treatment outcomes. I acknowledge that I will receive a copy of this consent concurrently upon execution via email to the email address I last provided but may also request a printed copy by calling the office of [facility].    I understand that my insurance will be billed for this visit.   I have read or had this consent read to me. . I understand the contents of this consent, which adequately explains the benefits and risks of the Services being provided via telemedicine.  . I have been provided ample opportunity to ask questions regarding this consent and the Services and have had my  questions answered to my satisfaction. . I give my informed consent for the services to be provided through the use of telemedicine in my medical care  By participating in this telemedicine visit I agree to the above.

## 2020-02-20 ENCOUNTER — Other Ambulatory Visit: Payer: Self-pay

## 2020-02-20 ENCOUNTER — Ambulatory Visit: Payer: Medicare HMO | Attending: Family | Admitting: Family

## 2020-02-20 ENCOUNTER — Encounter: Payer: Self-pay | Admitting: Family

## 2020-02-20 VITALS — BP 106/69 | Temp 96.6°F | Wt 205.0 lb

## 2020-02-20 DIAGNOSIS — Z8616 Personal history of COVID-19: Secondary | ICD-10-CM | POA: Insufficient documentation

## 2020-02-20 DIAGNOSIS — R739 Hyperglycemia, unspecified: Secondary | ICD-10-CM | POA: Diagnosis not present

## 2020-02-20 DIAGNOSIS — F419 Anxiety disorder, unspecified: Secondary | ICD-10-CM | POA: Insufficient documentation

## 2020-02-20 DIAGNOSIS — I5033 Acute on chronic diastolic (congestive) heart failure: Secondary | ICD-10-CM | POA: Diagnosis not present

## 2020-02-20 DIAGNOSIS — Z9981 Dependence on supplemental oxygen: Secondary | ICD-10-CM | POA: Diagnosis not present

## 2020-02-20 DIAGNOSIS — Z7982 Long term (current) use of aspirin: Secondary | ICD-10-CM | POA: Diagnosis not present

## 2020-02-20 DIAGNOSIS — K219 Gastro-esophageal reflux disease without esophagitis: Secondary | ICD-10-CM | POA: Diagnosis not present

## 2020-02-20 DIAGNOSIS — J9621 Acute and chronic respiratory failure with hypoxia: Secondary | ICD-10-CM | POA: Diagnosis not present

## 2020-02-20 DIAGNOSIS — I10 Essential (primary) hypertension: Secondary | ICD-10-CM

## 2020-02-20 DIAGNOSIS — F17210 Nicotine dependence, cigarettes, uncomplicated: Secondary | ICD-10-CM | POA: Diagnosis not present

## 2020-02-20 DIAGNOSIS — I5032 Chronic diastolic (congestive) heart failure: Secondary | ICD-10-CM | POA: Diagnosis not present

## 2020-02-20 DIAGNOSIS — I110 Hypertensive heart disease with heart failure: Secondary | ICD-10-CM | POA: Diagnosis not present

## 2020-02-20 DIAGNOSIS — U071 COVID-19: Secondary | ICD-10-CM | POA: Diagnosis not present

## 2020-02-20 DIAGNOSIS — Z801 Family history of malignant neoplasm of trachea, bronchus and lung: Secondary | ICD-10-CM | POA: Insufficient documentation

## 2020-02-20 DIAGNOSIS — Z79899 Other long term (current) drug therapy: Secondary | ICD-10-CM | POA: Diagnosis not present

## 2020-02-20 DIAGNOSIS — Z888 Allergy status to other drugs, medicaments and biological substances status: Secondary | ICD-10-CM | POA: Insufficient documentation

## 2020-02-20 DIAGNOSIS — J410 Simple chronic bronchitis: Secondary | ICD-10-CM

## 2020-02-20 DIAGNOSIS — F329 Major depressive disorder, single episode, unspecified: Secondary | ICD-10-CM | POA: Diagnosis not present

## 2020-02-20 DIAGNOSIS — Z720 Tobacco use: Secondary | ICD-10-CM

## 2020-02-20 DIAGNOSIS — E876 Hypokalemia: Secondary | ICD-10-CM | POA: Diagnosis not present

## 2020-02-20 DIAGNOSIS — Z885 Allergy status to narcotic agent status: Secondary | ICD-10-CM | POA: Insufficient documentation

## 2020-02-20 DIAGNOSIS — J449 Chronic obstructive pulmonary disease, unspecified: Secondary | ICD-10-CM | POA: Insufficient documentation

## 2020-02-20 DIAGNOSIS — J440 Chronic obstructive pulmonary disease with acute lower respiratory infection: Secondary | ICD-10-CM | POA: Diagnosis not present

## 2020-02-20 DIAGNOSIS — J9610 Chronic respiratory failure, unspecified whether with hypoxia or hypercapnia: Secondary | ICD-10-CM | POA: Diagnosis not present

## 2020-02-20 DIAGNOSIS — Z8249 Family history of ischemic heart disease and other diseases of the circulatory system: Secondary | ICD-10-CM | POA: Insufficient documentation

## 2020-02-20 DIAGNOSIS — J1282 Pneumonia due to coronavirus disease 2019: Secondary | ICD-10-CM | POA: Diagnosis not present

## 2020-02-20 NOTE — Patient Instructions (Signed)
Continue weighing daily and call for an overnight weight gain of > 2 pounds or a weekly weight gain of >5 pounds. 

## 2020-02-20 NOTE — Telephone Encounter (Signed)
[facility] called back in in regards to patient. PT states that the patient is requesting a Education officer, museum consult for community support and emotional support, due to feeling depressed. Please advise.   Home Health Verbal Orders - Caller/Agency: [facility] Number: [phone] Requesting OT/PT/Skilled Nursing/Social Work/Speech Therapy: Social work Frequency: evaluation

## 2020-02-20 NOTE — Progress Notes (Signed)
Virtual Visit via Telephone Note    Evaluation Performed:  Follow-up visit  This visit type was conducted due to national recommendations for restrictions regarding the COVID-19 Pandemic (e.g. social distancing).  This format is felt to be most appropriate for this patient at this time.  All issues noted in this document were discussed and addressed.  No physical exam was performed (except for noted visual exam findings with Video Visits).  Please refer to the patient's chart (MyChart message for video visits and phone note for telephone visits) for the patient's consent to telehealth for [facility]  Date:  02/20/2020   ID:  Theresa [name], DOB Apr 11, 1956, MRN [id number]  Patient Location:  [address] [place] [address]   Provider location:   [facility] [place] [address], [place] [address]  PCP:  Margo Common, [place]  Cardiologist:  Lujean Amel, MD Electrophysiologist:  None   Chief Complaint:  Shortness of breath  History of Present Illness:    Theresa [name] is a 64 y.o. female who presents via audio/video conferencing for a telehealth visit today.  Patient verified DOB and address.  The patient does not have symptoms concerning for COVID-19 infection (fever, chills, cough, or new SHORTNESS OF BREATH).   Patient reports minimal shortness of breath upon moderate exertion. She describes this as chronic in nature having been present for several years but says that it's much better since she had COVID. She has associated fatigue, difficulty sleeping and swelling around her ankles along with this. She denies any dizziness, palpitations, chest pain, cough or weight gain.  Says that she's lost ~ 50 pounds because she's been limiting her carbs/ sugars and closely watching her sodium intake. Overall she says that she feels much better  Prior CV studies:   The following studies were reviewed today:  Echo report from 07/07/2019  reviewed and showed an EF of 60-65% without LVH  Past Medical History:  Diagnosis Date  . Acute postoperative pain 10/03/2017  . Anxiety   . BiPAP (biphasic positive airway pressure) dependence    at hs  . Carpal tunnel syndrome   . CHF (congestive heart failure) ([place])    1/18  . CHF (congestive heart failure) ([place])   . Chronic generalized abdominal pain   . Chronic rhinitis   . COPD (chronic obstructive pulmonary disease) ([place])   . DDD (degenerative disc disease), cervical   . DDD (degenerative disc disease), lumbosacral   . Depression   . Dyspnea   . Edema   . Flu    1/18  . Gastritis   . GERD (gastroesophageal reflux disease)   . Hematuria   . Hernia of abdominal wall 07/17/2018  . Hernia, abdominal   . Hypertension   . Kidney stones   . Low back pain   . Lumbar radiculitis   . Malodorous urine   . Muscle weakness   . Obesity   . Oxygen dependent   . Renal cyst   . Sensory urge incontinence   . Thyroid activity decreased    9/19  . Tobacco abuse   . Wheezing    Past Surgical History:  Procedure Laterality Date  . ABDOMINAL HYSTERECTOMY    . CARPAL TUNNEL RELEASE Left 2012  . EPIGASTRIC HERNIA REPAIR N/A 08/13/2018   HERNIA REPAIR EPIGASTRIC ADULT; polypropylene mesh reinforcement.  Surgeon: Robert Bellow, MD;  Location: [facility];  Service: General;  Laterality: N/A;  . RIGHT/LEFT HEART  CATH AND CORONARY ANGIOGRAPHY N/A 07/11/2019   Procedure: RIGHT/LEFT HEART CATH AND CORONARY ANGIOGRAPHY;  Surgeon: Yolonda Kida, MD;  Location: [facility];  Service: Cardiovascular;  Laterality: N/A;  . TONSILLECTOMY    . TUBAL LIGATION    . UMBILICAL HERNIA REPAIR N/A 08/13/2018   HERNIA REPAIR UMBILICAL ADULT; polypropylene mesh reinforcement Surgeon: Robert Bellow, MD;  Location: [facility];  Service: General;  Laterality: N/A;     Current Meds  Medication Sig  . albuterol (VENTOLIN HFA) 108 (90 Base) MCG/ACT inhaler Inhale 2 puffs into the lungs  every 6 (six) hours as needed for wheezing or shortness of breath.  Marland Kitchen aspirin EC 81 MG tablet Take 81 mg by mouth daily.  . baclofen (LIORESAL) 10 MG tablet Take 1 tablet (10 mg total) by mouth 3 (three) times daily.  . Cholecalciferol (CVS D3) 2000 units CAPS Take 2,000 Units by mouth daily.   . digoxin (LANOXIN) 0.25 MG tablet TAKE 1 TABLET(0.25 MG) BY MOUTH DAILY  . Fluticasone-Umeclidin-Vilant 100-62.5-25 MCG/INH AEPB Inhale 1 puff into the lungs daily.  Marland Kitchen gabapentin (NEURONTIN) 400 MG capsule Take 1 capsule (400 mg total) by mouth 3 (three) times daily.  Marland Kitchen HYDROcodone-acetaminophen (NORCO/VICODIN) 5-325 MG tablet Take 1 tablet by mouth every 8 (eight) hours as needed for severe pain. Must last 30 days  . ipratropium-albuterol (DUONEB) 0.5-2.5 (3) MG/3ML SOLN USE 1 VIAL VIA NEBULIZER EVERY 6 HOURS AS NEEDED FOR SHORTNESS OF BREATH OR WHEEZING  . potassium chloride SA (KLOR-CON) 20 MEQ tablet TAKE 1 TABLET(20 MEQ) BY MOUTH TWICE DAILY  . sertraline (ZOLOFT) 100 MG tablet TAKE 2 TABLETS BY MOUTH EVERY MORNING  . torsemide (DEMADEX) 20 MG tablet TAKE 2 TABLETS(40 MG) BY MOUTH TWICE DAILY (Patient taking differently: Take 40 mg by mouth 2 (two) times daily. )  . zolpidem (AMBIEN) 5 MG tablet Take 1 tablet (5 mg total) by mouth at bedtime as needed for up to 14 days for sleep.     Allergies:   Codeine and Meloxicam   Social History   Tobacco Use  . Smoking status: Current Every Day Smoker    Packs/day: 0.50    Years: 44.00    Pack years: 22.00    Types: Cigarettes  . Smokeless tobacco: Never Used  . Tobacco comment: over a half pack  Substance Use Topics  . Alcohol use: No  . Drug use: No     Family Hx: The patient's family history includes Alcohol abuse in her father; Breast cancer in her sister; Cancer in her brother, brother, and sister; Coronary artery disease in her mother; Heart disease in her father and mother; Lung cancer in her sister; Pneumonia in her brother; Stroke in her  mother. There is no history of Prostate cancer, Kidney cancer, or Bladder Cancer.  ROS:   Please see the history of present illness.     All other systems reviewed and are negative.   Labs/Other Tests and Data Reviewed:    Recent Labs: 02/03/2020: B Natriuretic Peptide 190.0 02/08/2020: ALT 15; Hemoglobin 13.7; Platelets 266 02/10/2020: BUN 22; Creatinine, Ser 0.68; Magnesium 2.7; Sodium 139 02/11/2020: Potassium 3.8   Recent Lipid Panel No results found for: CHOL, TRIG, HDL, CHOLHDL, LDLCALC, LDLDIRECT  Wt Readings from Last 3 Encounters:  02/20/20 205 lb (93 kg)  02/09/20 205 lb 0.4 oz (93 kg)  10/30/19 247 lb (112 kg)     Exam:    Vital Signs:  BP 106/69 Comment: self-reported  Temp Marland Kitchen)  96.6 F (35.9 C) Comment: self reported  Wt 205 lb (93 kg) Comment: self-reported  SpO2 92% Comment: on 2L (self reported)  BMI 37.49 kg/m    Well nourished, well developed female in no  acute distress.   ASSESSMENT & PLAN:    1. Chronic heart failure with preserved ejection fraction- - NYHA class II - euvolemic per patient's description - continues to weigh daily at home. Reminded to call for an overnight weight gain of >2 pounds or a weekly weight gain of >5 pounds - says that her home weight is down to 205 pounds (down ~ 50 pounds in last few months) - she's watching carbs and sugars closely; continues to monitor sodium intake - saw cardiologist Dema Severin) 01/27/20 & returns June 2021 - BNP 02/03/20 was 190.0 - recent [place] admission last month but is feeling much better  2: HTN- - self-reported BP looks good - had video visit with PCP (Chrismon) 03/20/20 - BMP from 02/10/20 reviewed and showed sodium 139, potassium 3.9, creatinine 0.68 and GFR >60  3: COPD- - wearing bipap nightly - oxygen at 3L right now at bedtime and then as needed during the day - last saw pulmonology Raul Del) on 12/02/19  4: Tobacco use- - says that she's smoking "the same, no more and no less" - removes  herself from the oxygen when she smokes - complete cessation discussed for 3 minutes with her  COVID-19 Education: The signs and symptoms of COVID-19 were discussed with the patient and how to seek care for testing (follow up with PCP or arrange E-visit).  The importance of social distancing was discussed today.  Patient Risk:   After full review of this patients clinical status, I feel that they are at least moderate risk at this time.  Time:   Today, I have spent 30 minutes with the patient with telehealth technology discussing medications, symptoms and weight     Medication Adjustments/Labs and Tests Ordered: Current medicines are reviewed at length with the patient today.  Concerns regarding medicines are outlined above.   Tests Ordered: No orders of the defined types were placed in this encounter.  Medication Changes: No orders of the defined types were placed in this encounter.   Disposition:  Follow-up in 3 months or sooner for any questions/problems before then.   Signed, [name], FNP  02/20/2020 11:46 AM    [facility]

## 2020-02-22 ENCOUNTER — Other Ambulatory Visit: Payer: Self-pay | Admitting: Family Medicine

## 2020-02-22 NOTE — Telephone Encounter (Signed)
Requested Prescriptions  Pending Prescriptions Disp Refills  . digoxin (LANOXIN) 0.25 MG tablet [Pharmacy Med Name: DIGOXIN 0.25MG TABLETS (WHITE)] 90 tablet 0    Sig: TAKE 1 TABLET(0.25 MG) BY MOUTH DAILY     Cardiovascular:  Antiarrhythmic Agents - digoxin Failed - 02/22/2020  4:18 PM      Failed - Ca in normal range and within 180 days    Calcium  Date Value Ref Range Status  02/10/2020 8.6 (L) 8.9 - 10.3 mg/dL Final   Calcium, Total  Date Value Ref Range Status  02/02/2015 8.7 (L) mg/dL Final    Comment:    8.9-10.3 NOTE: New Reference Range  12/23/14    Calcium, Ion  Date Value Ref Range Status  10/11/2019 1.16 1.15 - 1.40 mmol/L Final         Passed - Cr in normal range and within 180 days    Creatinine  Date Value Ref Range Status  02/02/2015 0.70 mg/dL Final    Comment:    0.44-1.00 NOTE: New Reference Range  12/23/14    Creatinine, Ser  Date Value Ref Range Status  02/10/2020 0.68 0.44 - 1.00 mg/dL Final         Passed - Digoxin (serum) in normal range and within 180 days    Digoxin Level  Date Value Ref Range Status  10/11/2019 1.5 0.8 - 2.0 ng/mL Final    Comment:    Performed at [facility], [place] [address] [place], [place] [address]         Passed - K in normal range and within 180 days    Potassium  Date Value Ref Range Status  02/11/2020 3.8 3.5 - 5.1 mmol/L Final    Comment:    Performed at [facility], [address] [place], [place] [address]  02/02/2015 3.9 mmol/L Final    Comment:    3.5-5.1 NOTE: New Reference Range  12/23/14          Passed - Last Heart Rate in normal range    Pulse Readings from Last 1 Encounters:  02/11/20 62         Passed - Valid encounter within last 6 months    Recent Outpatient Visits          4 days ago Weakness   [place], [place]   2 months ago Essential (primary) hypertension   [facility], [place], [place]   3 months ago  Episode of altered consciousness   [facility] [place], Dionne Bucy, MD   4 months ago SOB (shortness of breath)   [place], FNP   7 months ago 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

## 2020-02-24 NOTE — Telephone Encounter (Signed)
Theresa [name] called to check request

## 2020-02-24 NOTE — Telephone Encounter (Signed)
Advised 

## 2020-02-25 ENCOUNTER — Telehealth: Payer: Self-pay

## 2020-02-25 ENCOUNTER — Other Ambulatory Visit: Payer: Self-pay | Admitting: Family Medicine

## 2020-02-25 DIAGNOSIS — J029 Acute pharyngitis, unspecified: Secondary | ICD-10-CM

## 2020-02-25 MED ORDER — AZITHROMYCIN 250 MG PO TABS: ORAL_TABLET | ORAL | 0 refills | Status: DC

## 2020-02-25 NOTE — Telephone Encounter (Signed)
Copied from [place] [id number]. Topic: General - Other >> Feb 25, 2020  4:33 PM Rainey Pines A wrote: Patient was recently seen and requesting Chrismon send in medication for her sore throat and swollen glands. Patient requesting callback from nurse. Please advise

## 2020-02-25 NOTE — Telephone Encounter (Signed)
Called patient and ordered treatment.

## 2020-02-25 NOTE — Progress Notes (Signed)
Having significant burning sore throat with temperature spike to 99.9 today. Pulse oximetry 94-95% and some rhinorrhea that started 2 days ago. Thought it was some allergic rhinitis at the onset. May use antihistamine, saltwater gargles and add Z-pak. She is worried about infection with history of COVID-19 pneumonia  2 weeks ago. Monitor temperature, drink extra fluids and call report or schedule virtual video visit in 2-3 days.

## 2020-02-27 ENCOUNTER — Telehealth: Payer: Self-pay

## 2020-02-27 DIAGNOSIS — U071 COVID-19: Secondary | ICD-10-CM | POA: Diagnosis not present

## 2020-02-27 DIAGNOSIS — F419 Anxiety disorder, unspecified: Secondary | ICD-10-CM | POA: Diagnosis not present

## 2020-02-27 DIAGNOSIS — J1282 Pneumonia due to coronavirus disease 2019: Secondary | ICD-10-CM | POA: Diagnosis not present

## 2020-02-27 DIAGNOSIS — I110 Hypertensive heart disease with heart failure: Secondary | ICD-10-CM | POA: Diagnosis not present

## 2020-02-27 DIAGNOSIS — I5033 Acute on chronic diastolic (congestive) heart failure: Secondary | ICD-10-CM | POA: Diagnosis not present

## 2020-02-27 DIAGNOSIS — J9621 Acute and chronic respiratory failure with hypoxia: Secondary | ICD-10-CM | POA: Diagnosis not present

## 2020-02-27 DIAGNOSIS — E876 Hypokalemia: Secondary | ICD-10-CM | POA: Diagnosis not present

## 2020-02-27 DIAGNOSIS — J440 Chronic obstructive pulmonary disease with acute lower respiratory infection: Secondary | ICD-10-CM | POA: Diagnosis not present

## 2020-02-27 DIAGNOSIS — R739 Hyperglycemia, unspecified: Secondary | ICD-10-CM | POA: Diagnosis not present

## 2020-02-27 NOTE — Telephone Encounter (Signed)
Copied from [place] [id number]. Topic: General - Other >> Feb 27, 2020  4:51 PM Rainey Pines A wrote: Physical therapist called to inform that Patient is not feeling well and missed therapy session on Wednesday

## 2020-02-28 DIAGNOSIS — J1282 Pneumonia due to coronavirus disease 2019: Secondary | ICD-10-CM | POA: Diagnosis not present

## 2020-02-28 DIAGNOSIS — R739 Hyperglycemia, unspecified: Secondary | ICD-10-CM | POA: Diagnosis not present

## 2020-02-28 DIAGNOSIS — J9621 Acute and chronic respiratory failure with hypoxia: Secondary | ICD-10-CM | POA: Diagnosis not present

## 2020-02-28 DIAGNOSIS — F419 Anxiety disorder, unspecified: Secondary | ICD-10-CM | POA: Diagnosis not present

## 2020-02-28 DIAGNOSIS — J440 Chronic obstructive pulmonary disease with acute lower respiratory infection: Secondary | ICD-10-CM | POA: Diagnosis not present

## 2020-02-28 DIAGNOSIS — I110 Hypertensive heart disease with heart failure: Secondary | ICD-10-CM | POA: Diagnosis not present

## 2020-02-28 DIAGNOSIS — E876 Hypokalemia: Secondary | ICD-10-CM | POA: Diagnosis not present

## 2020-02-28 DIAGNOSIS — I5033 Acute on chronic diastolic (congestive) heart failure: Secondary | ICD-10-CM | POA: Diagnosis not present

## 2020-02-28 DIAGNOSIS — U071 COVID-19: Secondary | ICD-10-CM | POA: Diagnosis not present

## 2020-03-02 DIAGNOSIS — U071 COVID-19: Secondary | ICD-10-CM | POA: Diagnosis not present

## 2020-03-02 DIAGNOSIS — F17218 Nicotine dependence, cigarettes, with other nicotine-induced disorders: Secondary | ICD-10-CM | POA: Diagnosis not present

## 2020-03-02 DIAGNOSIS — J439 Emphysema, unspecified: Secondary | ICD-10-CM | POA: Diagnosis not present

## 2020-03-02 DIAGNOSIS — Z9981 Dependence on supplemental oxygen: Secondary | ICD-10-CM | POA: Diagnosis not present

## 2020-03-02 DIAGNOSIS — J1282 Pneumonia due to coronavirus disease 2019: Secondary | ICD-10-CM | POA: Diagnosis not present

## 2020-03-02 DIAGNOSIS — R0602 Shortness of breath: Secondary | ICD-10-CM | POA: Diagnosis not present

## 2020-03-02 DIAGNOSIS — I272 Pulmonary hypertension, unspecified: Secondary | ICD-10-CM | POA: Diagnosis not present

## 2020-03-03 NOTE — Telephone Encounter (Signed)
Pt called to let Simona Huh know she is feeling much better and doing good.

## 2020-03-03 NOTE — Telephone Encounter (Signed)
Acknowledged.

## 2020-03-04 ENCOUNTER — Other Ambulatory Visit: Payer: Self-pay

## 2020-03-04 ENCOUNTER — Telehealth: Payer: Self-pay

## 2020-03-04 DIAGNOSIS — Z8616 Personal history of COVID-19: Secondary | ICD-10-CM

## 2020-03-04 DIAGNOSIS — R531 Weakness: Secondary | ICD-10-CM | POA: Diagnosis not present

## 2020-03-04 DIAGNOSIS — E876 Hypokalemia: Secondary | ICD-10-CM

## 2020-03-04 DIAGNOSIS — I5032 Chronic diastolic (congestive) heart failure: Secondary | ICD-10-CM

## 2020-03-04 DIAGNOSIS — J449 Chronic obstructive pulmonary disease, unspecified: Secondary | ICD-10-CM

## 2020-03-04 DIAGNOSIS — E039 Hypothyroidism, unspecified: Secondary | ICD-10-CM | POA: Diagnosis not present

## 2020-03-04 DIAGNOSIS — R0902 Hypoxemia: Secondary | ICD-10-CM

## 2020-03-04 NOTE — Telephone Encounter (Signed)
Patient was advised that Simona Huh was out of the office and that since she was having pain and per the patient her x-ray showed she still had pneumonia, I recommended she contact her Pulmonologist since he had just seen her and he ordered the x-ray.  Patien in agreement.

## 2020-03-04 NOTE — Telephone Encounter (Signed)
Copied from [place] [id number]. Topic: General - Other >> Mar 04, 2020  3:56 PM Alanda Slim E wrote: Reason for CRM: Pt hadc covid in April and then pnuemonia and was prescribed azithromycin (ZITHROMAX) 250 MG tablet /Pt had an xray yesterday and was advised that she still has pneumonia/ Pt wants to ask Simona Huh if she needs another antibiotic so she doesnt get full blown pneumonia/ please advise

## 2020-03-05 DIAGNOSIS — U071 COVID-19: Secondary | ICD-10-CM | POA: Diagnosis not present

## 2020-03-05 DIAGNOSIS — R739 Hyperglycemia, unspecified: Secondary | ICD-10-CM | POA: Diagnosis not present

## 2020-03-05 DIAGNOSIS — I110 Hypertensive heart disease with heart failure: Secondary | ICD-10-CM | POA: Diagnosis not present

## 2020-03-05 DIAGNOSIS — I5033 Acute on chronic diastolic (congestive) heart failure: Secondary | ICD-10-CM | POA: Diagnosis not present

## 2020-03-05 DIAGNOSIS — F419 Anxiety disorder, unspecified: Secondary | ICD-10-CM | POA: Diagnosis not present

## 2020-03-05 DIAGNOSIS — J1282 Pneumonia due to coronavirus disease 2019: Secondary | ICD-10-CM | POA: Diagnosis not present

## 2020-03-05 DIAGNOSIS — E876 Hypokalemia: Secondary | ICD-10-CM | POA: Diagnosis not present

## 2020-03-05 DIAGNOSIS — J440 Chronic obstructive pulmonary disease with acute lower respiratory infection: Secondary | ICD-10-CM | POA: Diagnosis not present

## 2020-03-05 DIAGNOSIS — J9621 Acute and chronic respiratory failure with hypoxia: Secondary | ICD-10-CM | POA: Diagnosis not present

## 2020-03-05 LAB — COMPREHENSIVE METABOLIC PANEL
ALT: 16 IU/L (ref 0–32)
AST: 21 IU/L (ref 0–40)
Albumin/Globulin Ratio: 1.4 (ref 1.2–2.2)
Albumin: 3.8 g/dL (ref 3.8–4.8)
Alkaline Phosphatase: 149 IU/L — ABNORMAL HIGH (ref 48–121)
BUN/Creatinine Ratio: 16 (ref 12–28)
BUN: 12 mg/dL (ref 8–27)
Bilirubin Total: 0.3 mg/dL (ref 0.0–1.2)
CO2: 24 mmol/L (ref 20–29)
Calcium: 9.2 mg/dL (ref 8.7–10.3)
Chloride: 100 mmol/L (ref 96–106)
Creatinine, Ser: 0.75 mg/dL (ref 0.57–1.00)
GFR calc Af Amer: 99 mL/min/{1.73_m2} (ref >59)
GFR calc non Af Amer: 86 mL/min/{1.73_m2} (ref >59)
Globulin, Total: 2.7 g/dL (ref 1.5–4.5)
Glucose: 89 mg/dL (ref 65–99)
Potassium: 3.9 mmol/L (ref 3.5–5.2)
Sodium: 139 mmol/L (ref 134–144)
Total Protein: 6.5 g/dL (ref 6.0–8.5)

## 2020-03-05 LAB — CBC WITH DIFFERENTIAL/PLATELET
Basophils Absolute: 0.1 10*3/uL (ref 0.0–0.2)
Basos: 1 %
EOS (ABSOLUTE): 0.2 10*3/uL (ref 0.0–0.4)
Eos: 2 %
Hematocrit: 43.4 % (ref 34.0–46.6)
Hemoglobin: 13.5 g/dL (ref 11.1–15.9)
Immature Grans (Abs): 0.1 10*3/uL (ref 0.0–0.1)
Immature Granulocytes: 1 %
Lymphocytes Absolute: 1.4 10*3/uL (ref 0.7–3.1)
Lymphs: 15 %
MCH: 28.5 pg (ref 26.6–33.0)
MCHC: 31.1 g/dL — ABNORMAL LOW (ref 31.5–35.7)
MCV: 92 fL (ref 79–97)
Monocytes Absolute: 0.5 10*3/uL (ref 0.1–0.9)
Monocytes: 5 %
Neutrophils Absolute: 7 10*3/uL (ref 1.4–7.0)
Neutrophils: 76 %
Platelets: 189 10*3/uL (ref 150–450)
RBC: 4.73 x10E6/uL (ref 3.77–5.28)
RDW: 21.7 % — ABNORMAL HIGH (ref 11.7–15.4)
WBC: 9.1 10*3/uL (ref 3.4–10.8)

## 2020-03-05 LAB — TSH: TSH: 1.52 u[IU]/mL (ref 0.450–4.500)

## 2020-03-05 LAB — T4: T4, Total: 5.5 ug/dL (ref 4.5–12.0)

## 2020-03-09 IMAGING — MR MR KNEE*L* W/O CM
6 series · 40 of 40 positions shown · non-contrast
Comparison: X-ray 07/16/2019

CLINICAL DATA: Left knee pain and instability

EXAM:
MRI OF THE LEFT KNEE WITHOUT CONTRAST
TECHNIQUE: Multiplanar, multisequence MR imaging of the knee was performed. No
intravenous contrast was administered.

[Series 8: T2 fat-sat · axial · left · 4.0mm · 0.50mm/px · z∈[-67,+56]mm · 6 of 26 slices shown (1 of 3)]
[im 1/26]
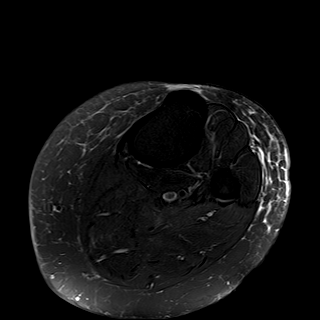
[im 6/26]
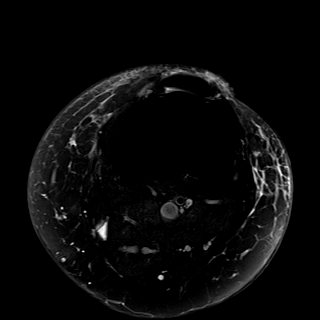
[im 11/26]
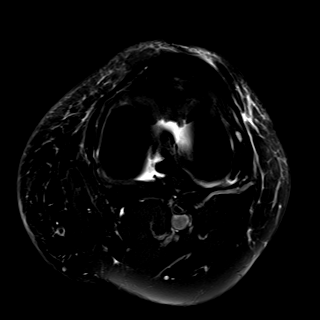
[im 16/26]
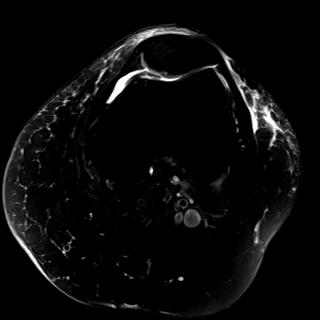
[im 21/26]
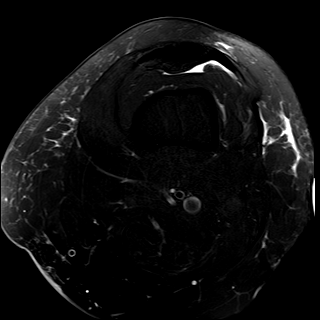
[im 26/26]
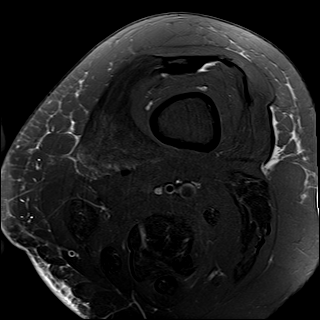

[Series 9: T2 fat-sat · coronal · left · 4.0mm · 0.59mm/px · 6 of 30 slices shown (2 of 3)]
[im 1/30]
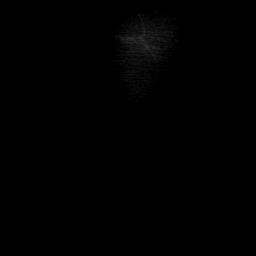
[im 6/30]
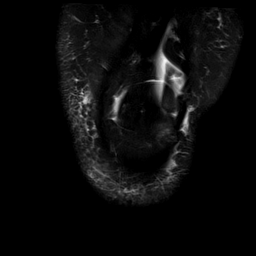
[im 12/30]
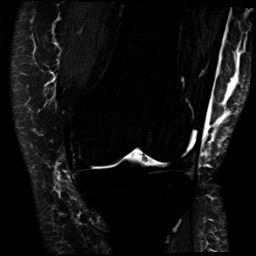
[im 18/30]
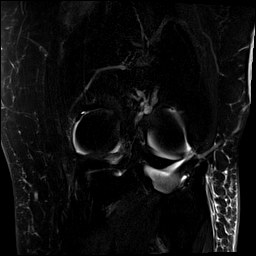
[im 24/30]
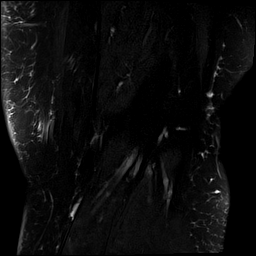
[im 30/30]
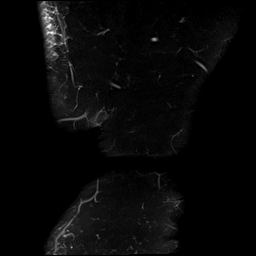

[Series 10: T1 · coronal · left · 4.0mm · 0.59mm/px · 6 of 30 slices shown]
[im 1/30]
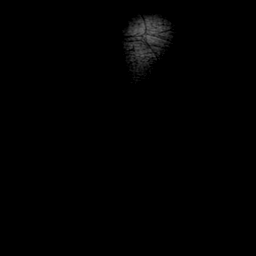
[im 6/30]
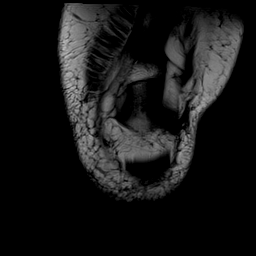
[im 12/30]
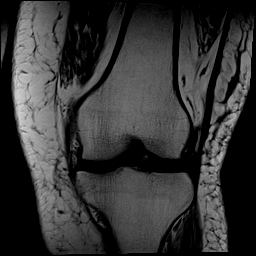
[im 18/30]
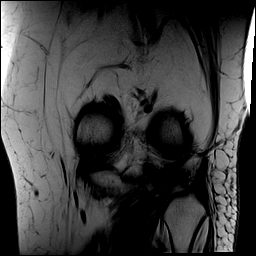
[im 24/30]
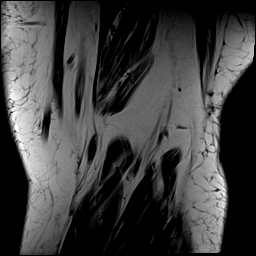
[im 30/30]
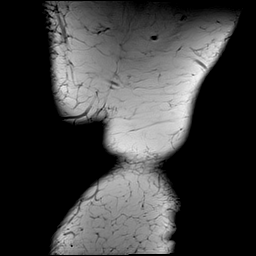

[Series 11: PD fat-sat · coronal · left · 4.0mm · 0.59mm/px · 6 of 30 slices shown (1 of 2)]
[im 1/30]
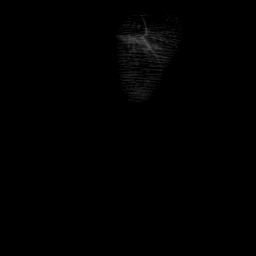
[im 6/30]
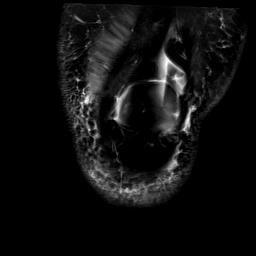
[im 12/30]
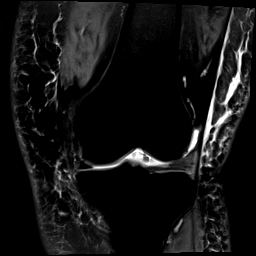
[im 18/30]
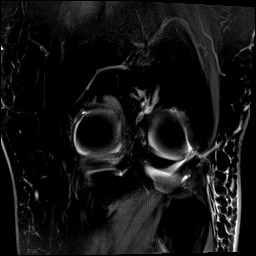
[im 24/30]
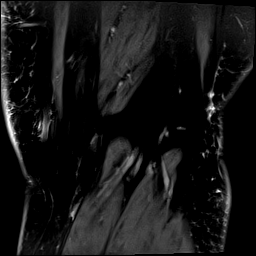
[im 30/30]
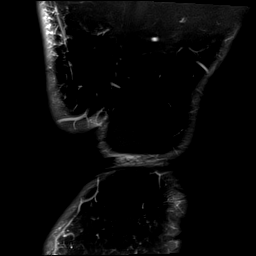

[Series 12: PD fat-sat · sagittal · left · 3.0mm · 0.59mm/px · 8 of 36 slices shown (2 of 2)]
[im 1/36]
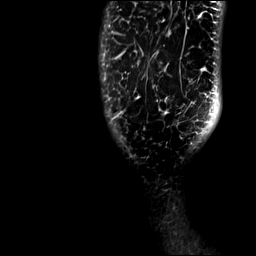
[im 6/36]
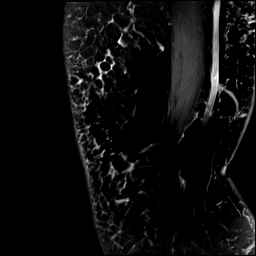
[im 11/36]
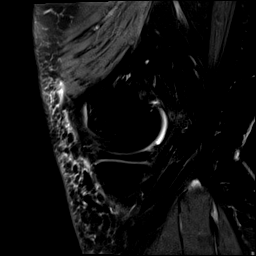
[im 16/36]
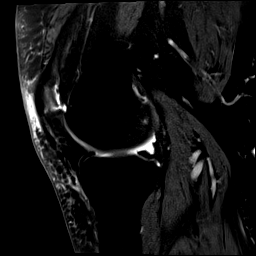
[im 21/36]
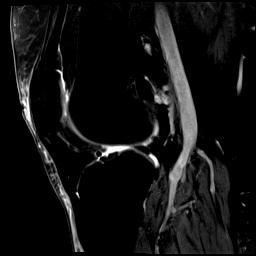
[im 26/36]
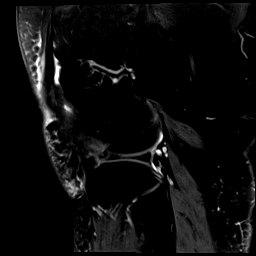
[im 31/36]
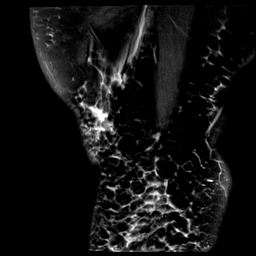
[im 36/36]
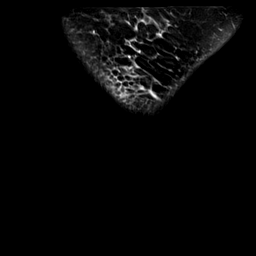

[Series 13: T2 fat-sat · sagittal · left · 3.0mm · 0.59mm/px · 8 of 36 slices shown (3 of 3)]
[im 1/36]
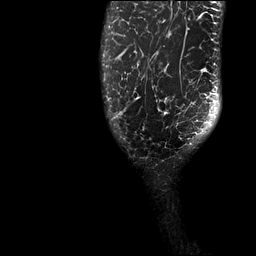
[im 6/36]
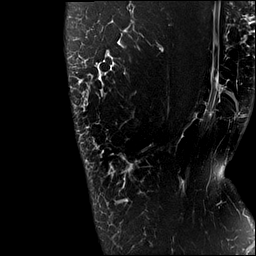
[im 11/36]
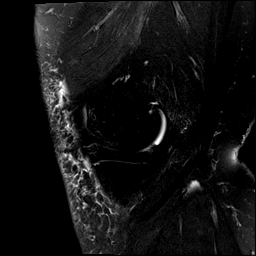
[im 16/36]
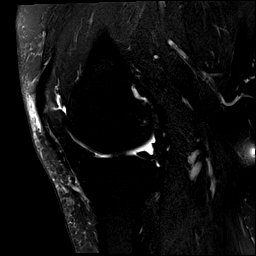
[im 21/36]
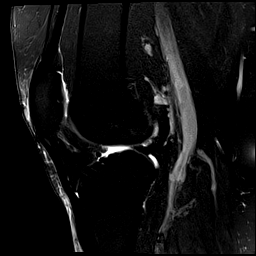
[im 26/36]
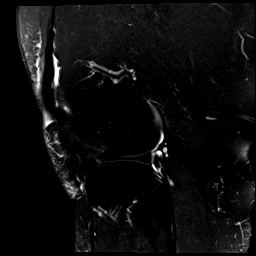
[im 31/36]
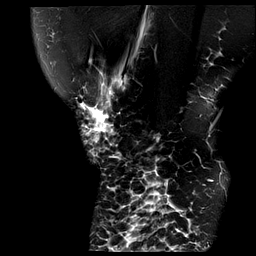
[im 36/36]
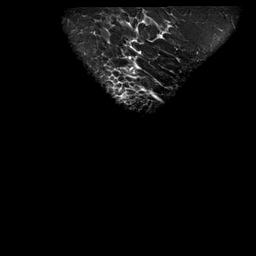

[40 of 40 positions shown; findings below may reference images not displayed]

FINDINGS: MENISCI

Medial meniscus:  Intact.

Lateral meniscus: Intermediate intrasubstance signal within the
lateral meniscal body with horizontal signal extending to the
superior articular surface (series 11, image 17) suggesting a
nondisplaced horizontal tear.

LIGAMENTS

Cruciates:  Intact ACL and PCL.

Collaterals: Ossification at the femoral attachment of the MCL
compatible with remote prior injury. MCL otherwise intact without
acute injury. Lateral collateral ligament complex intact.

CARTILAGE

Patellofemoral: High-grade cartilage irregularity with
full-thickness cartilage fissuring at the patellar apex and inferior
aspect of the medial patellar facet (series 8, image 10) no discrete
trochlear cartilage defect identified.

Medial:  Intact.

Lateral:  Intact.

Joint:  Trace knee joint effusion. Hoffa's fat within normal limits.

Popliteal Fossa: Trace Baker's cyst. Somewhat thickened
heterogeneous appearance of the distal popliteus tendon.

Extensor Mechanism:  Intact quadriceps tendon and patellar tendon.

Bones: No focal marrow signal abnormality. No fracture or
dislocation.

Other: Mild diffuse nonspecific subcutaneous edema.
IMPRESSION: 1. Intermediate intrasubstance signal within the body of the lateral
meniscus with horizontal signal extending to the superior articular
surface suggesting degeneration with nondisplaced horizontal tear.
2. High-grade cartilage irregularity with full-thickness fissuring
at the patellar apex and inferior aspect of the medial patellar
facet.
3. Trace knee joint effusion with trace Baker's cyst.

## 2020-03-11 DIAGNOSIS — I110 Hypertensive heart disease with heart failure: Secondary | ICD-10-CM | POA: Diagnosis not present

## 2020-03-11 DIAGNOSIS — R739 Hyperglycemia, unspecified: Secondary | ICD-10-CM | POA: Diagnosis not present

## 2020-03-11 DIAGNOSIS — U071 COVID-19: Secondary | ICD-10-CM | POA: Diagnosis not present

## 2020-03-11 DIAGNOSIS — F419 Anxiety disorder, unspecified: Secondary | ICD-10-CM | POA: Diagnosis not present

## 2020-03-11 DIAGNOSIS — J9621 Acute and chronic respiratory failure with hypoxia: Secondary | ICD-10-CM | POA: Diagnosis not present

## 2020-03-11 DIAGNOSIS — J440 Chronic obstructive pulmonary disease with acute lower respiratory infection: Secondary | ICD-10-CM | POA: Diagnosis not present

## 2020-03-11 DIAGNOSIS — J1282 Pneumonia due to coronavirus disease 2019: Secondary | ICD-10-CM | POA: Diagnosis not present

## 2020-03-11 DIAGNOSIS — I5033 Acute on chronic diastolic (congestive) heart failure: Secondary | ICD-10-CM | POA: Diagnosis not present

## 2020-03-11 DIAGNOSIS — E876 Hypokalemia: Secondary | ICD-10-CM | POA: Diagnosis not present

## 2020-03-14 DIAGNOSIS — U071 COVID-19: Secondary | ICD-10-CM | POA: Diagnosis not present

## 2020-03-20 NOTE — Progress Notes (Signed)
Established patient visit   Patient: Theresa [name]   DOB: 1956/02/24   64 y.o. Female  MRN: [id number] Visit Date: 03/23/2020  Today's healthcare provider: [name], PA   Chief Complaint  Patient presents with   Hypertension   Subjective    HPI  The patient is a 64 year old female who presents today for a 3 month follow up.  She was seen on 3/8/21for her chronic health issues.  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   She had her AWV with the nurse health advisor via phone visit and a Video visit on 02/18/20 for follow up of her chronic health and post Covid-19 issues. It is of note that the patient had hypokalemia in the hospital that  was improved after receiving IV fluids.   Patient has been checking her blood pressure at home and getting readings of 90's over 60s.  She has not been on any blood pressure medications since her February visit.  Patient is on 2 LPM of O2 and has been getting saturations in the upper 90's.  She states that today is the first time she has come to the office and not used the wheel chair.  She further states that when she goes out she usually increases her oxygen but did not do that today and is staying around 92-95.  Lab Results  Component Value Date   CREATININE 0.75 03/04/2020   BUN 12 03/04/2020   NA 139 03/04/2020   K 3.9 03/04/2020   CL 100 03/04/2020   CO2 24 03/04/2020     Hypertension, follow-up Patient was last seen for her regular check up on the 12/23/19.  At that time she was having some trouble with low blood pressure readings.  She was instructed at that time to hold her Ziac if she  Had blood pressure of lower than 100/60 in the morning. Advised her that she may need to have her Ziac decreased  BP Readings from Last 3 Encounters:  03/23/20 102/64  02/20/20 106/69  02/11/20 112/63   Wt Readings from Last 3 Encounters:  03/23/20 204 lb (92.5 kg)  02/20/20 205 lb (93 kg)  02/09/20  205 lb 0.4 oz (93 kg)      She reports good compliance with treatment. She is not having side effects.  She is following a Keto diet. She is not exercising. She does smoke.  Use of agents associated with hypertension: none.   Outside blood pressures are 96/64 average. Symptoms: No chest pain No chest pressure  No palpitations No syncope  Yes dyspnea Yes orthopnea  No paroxysmal nocturnal dyspnea Yes lower extremity edema   Pertinent labs: No results found for: CHOL, HDL, LDLCALC, LDLDIRECT, TRIG, CHOLHDL Lab Results  Component Value Date   NA 139 03/04/2020   K 3.9 03/04/2020   CREATININE 0.75 03/04/2020   GFRNONAA 86 03/04/2020   GFRAA 99 03/04/2020   GLUCOSE 89 03/04/2020     The ASCVD Risk score Mikey Bussing DC Jr., et al., 2013) failed to calculate for the following reasons:   Cannot find a previous HDL lab   Cannot find a previous total cholesterol lab   ---------------------------------------------------------------------------------------------------   History of Syncope-Patient had been seen in the ER for this and had normal CT and MRI.  Per the ER note, her symptoms were suspicious for Post ictal confusion.  She had been seen by Neurology on 11/25/19.  COPD with Hypoxia- Patient is on oxygen at  2-2.5 LPM during the day and increases it to 3 LPM at night with her CPAP.  Chronic diastolic heart failure-Patient is oxygen dependant without peripheral edema on the visit in March.  She is followed by Dr Clayborn Bigness, cardiology and Darylene Price at the [facility].   Her last ProBNP was 190  Past Medical History:  Diagnosis Date   Acute postoperative pain 10/03/2017   Anxiety    BiPAP (biphasic positive airway pressure) dependence    at hs   Carpal tunnel syndrome    CHF (congestive heart failure) ([place])    1/18   CHF (congestive heart failure) (HCC)    Chronic generalized abdominal pain    Chronic rhinitis    COPD (chronic obstructive pulmonary disease) (HCC)     DDD (degenerative disc disease), cervical    DDD (degenerative disc disease), lumbosacral    Depression    Dyspnea    Edema    Flu    1/18   Gastritis    GERD (gastroesophageal reflux disease)    Hematuria    Hernia of abdominal wall 07/17/2018   Hernia, abdominal    Hypertension    Kidney stones    Low back pain    Lumbar radiculitis    Malodorous urine    Muscle weakness    Obesity    Oxygen dependent    Renal cyst    Sensory urge incontinence    Thyroid activity decreased    9/19   Tobacco abuse    Wheezing    Social History   Tobacco Use   Smoking status: Current Every Day Smoker    Packs/day: 0.50    Years: 44.00    Pack years: 22.00    Types: Cigarettes   Smokeless tobacco: Never Used   Tobacco comment: over a half pack  Substance Use Topics   Alcohol use: No   Drug use: No   Family Status  Relation Name Status   Mother  Deceased   Sister  Deceased   Father  Deceased   Brother  Deceased   Brother  Deceased   Brother  Deceased   Neg Hx  (Not Specified)   Allergies  Allergen Reactions   Codeine Nausea And Vomiting   Meloxicam Other (See Comments)    Stomach pain       Medications: Outpatient Medications Prior to Visit  Medication Sig   albuterol (VENTOLIN HFA) 108 (90 Base) MCG/ACT inhaler Inhale 2 puffs into the lungs every 6 (six) hours as needed for wheezing or shortness of breath.   aspirin EC 81 MG tablet Take 81 mg by mouth daily.   baclofen (LIORESAL) 10 MG tablet Take 1 tablet (10 mg total) by mouth 3 (three) times daily.   Cholecalciferol ([facility] D3) 2000 units CAPS Take 2,000 Units by mouth daily.    digoxin (LANOXIN) 0.25 MG tablet TAKE 1 TABLET(0.25 MG) BY MOUTH DAILY   Fluticasone-Umeclidin-Vilant 100-62.5-25 MCG/INH AEPB Inhale 1 puff into the lungs daily.   gabapentin (NEURONTIN) 400 MG capsule Take 1 capsule (400 mg total) by mouth 3 (three) times daily.   ipratropium-albuterol (DUONEB)  0.5-2.5 (3) MG/3ML SOLN USE 1 VIAL VIA NEBULIZER EVERY 6 HOURS AS NEEDED FOR SHORTNESS OF BREATH OR WHEEZING   levothyroxine (SYNTHROID) 25 MCG tablet TAKE 1 TABLET(25 MCG) BY MOUTH DAILY BEFORE BREAKFAST   potassium chloride SA (KLOR-CON) 20 MEQ tablet TAKE 1 TABLET(20 MEQ) BY MOUTH TWICE DAILY   sertraline (ZOLOFT) 100 MG tablet TAKE 2 TABLETS BY MOUTH  EVERY MORNING   torsemide (DEMADEX) 20 MG tablet TAKE 2 TABLETS(40 MG) BY MOUTH TWICE DAILY (Patient taking differently: Take 40 mg by mouth 2 (two) times daily. )   azithromycin (ZITHROMAX) 250 MG tablet Take 2 tablets by mouth today then one daily for 4 days. (Patient not taking: Reported on 03/23/2020)   bisoprolol-hydrochlorothiazide (ZIAC) 10-6.25 MG tablet TAKE 1 TABLET BY MOUTH DAILY (Patient not taking: Reported on 02/04/2020)   HYDROcodone-acetaminophen (NORCO/VICODIN) 5-325 MG tablet Take 1 tablet by mouth every 8 (eight) hours as needed for severe pain. Must last 30 days   zolpidem (AMBIEN) 5 MG tablet Take 1 tablet (5 mg total) by mouth at bedtime as needed for up to 14 days for sleep.   No facility-administered medications prior to visit.    Review of Systems  Constitutional: Negative for chills, fatigue and fever.  Respiratory: Positive for cough (slight) and shortness of breath (chronic). Negative for wheezing.   Cardiovascular: Negative for chest pain, palpitations and leg swelling.  Gastrointestinal: Negative for abdominal pain and diarrhea.  Musculoskeletal: Negative for arthralgias, joint swelling and myalgias.  Neurological: Negative for dizziness and headaches.      Objective    BP 102/64 (BP Location: Right Arm, Patient Position: Sitting, Cuff Size: Normal)    Pulse 88    Temp (!) 97.5 F (36.4 C) (Skin)    Wt 204 lb (92.5 kg)    SpO2 92%    BMI 37.31 kg/m  BP Readings from Last 3 Encounters:  03/23/20 102/64  02/20/20 106/69  02/11/20 112/63   Wt Readings from Last 3 Encounters:  03/23/20 204 lb (92.5 kg)   02/20/20 205 lb (93 kg)  02/09/20 205 lb 0.4 oz (93 kg)   Physical Exam Constitutional:      General: She is not in acute distress.    Appearance: She is well-developed.  HENT:     Head: Normocephalic and atraumatic.     Right Ear: Hearing normal.     Left Ear: Hearing normal.     Nose: Nose normal.  Eyes:     General: Lids are normal. No scleral icterus.       Right eye: No discharge.        Left eye: No discharge.     Conjunctiva/sclera: Conjunctivae normal.  Cardiovascular:     Rate and Rhythm: Normal rate and regular rhythm.     Heart sounds: Normal heart sounds.  Pulmonary:     Effort: Pulmonary effort is normal. No respiratory distress.  Abdominal:     General: Bowel sounds are normal.     Palpations: Abdomen is soft.  Musculoskeletal:        General: Normal range of motion.     Cervical back: Neck supple.  Skin:    Findings: No lesion or rash.  Neurological:     Mental Status: She is alert and oriented to person, place, and time.  Psychiatric:        Speech: Speech normal.        Behavior: Behavior normal.        Thought Content: Thought content normal.    No results found for any visits on 03/23/20.    Assessment & Plan     1. Essential (primary) hypertension Feeling better with BP staying in the 105-95/64-68 at home since stopping the Ziac. Still using Torsemide to maintain fluid balance with CHF and continues Digoxin. More energetic now and has not had to use the wheelchair much since she lost  40 lbs over the past 5 months. Labs on 03-04-20 is back to normal.  2. Chronic diastolic heart failure ([place]) Followed by Darylene Price, FNP at the heart failure clinic on 02-20-20. Still using Digoxin o.25 mg qd and Torsemide 40 mg up to BID pending weight. Weight down 40 lbs over the past 5 months with a Keto Diet and salt restriction. Feeling more energetic and breathing easier. Continue regular follow up with cardiologist (Dr. Clayborn Bigness).  3. History of  COVID-19 Hospitalized 02-03-20 to 02-11-20 with COVID pneumonia. Has had follow up with her pulmonologist and x-rays showed improvement. Breathing easier with use of Trelegy now. Dr. Raul Del plans recheck of CXR in July to assess progress. No recent fever, cough or loss of taste.  4. COPD with hypoxia (HCC) Breathing better and pulse oximetry improved with significant weight loss this year. Still using oxygen at 2-2.5 LPM and BiPAP at night. Followed by Dr. Raul Del (pulmonologist)  5. Chronic lumbar radiculopathy/radiculitis (L2) (Left) Chronic back pain treated by Dr. Dossie Arbour (pain management). Feeling much improved with 40 lb weight loss the past 5 months.   No follow-ups on file.     Andres Shad, PA, have reviewed all documentation for this visit. The documentation on 03/23/20 for the exam, diagnosis, procedures, and orders are all accurate and complete.    [name], [place] [phone] (phone) [phone] (fax)  [place]

## 2020-03-22 DIAGNOSIS — J9610 Chronic respiratory failure, unspecified whether with hypoxia or hypercapnia: Secondary | ICD-10-CM | POA: Diagnosis not present

## 2020-03-23 ENCOUNTER — Telehealth: Payer: Self-pay

## 2020-03-23 ENCOUNTER — Other Ambulatory Visit: Payer: Self-pay

## 2020-03-23 ENCOUNTER — Encounter: Payer: Self-pay | Admitting: Family Medicine

## 2020-03-23 ENCOUNTER — Ambulatory Visit (INDEPENDENT_AMBULATORY_CARE_PROVIDER_SITE_OTHER): Payer: Medicare HMO | Admitting: Family Medicine

## 2020-03-23 VITALS — BP 102/64 | HR 88 | Temp 97.5°F | Wt 204.0 lb

## 2020-03-23 DIAGNOSIS — I5032 Chronic diastolic (congestive) heart failure: Secondary | ICD-10-CM | POA: Diagnosis not present

## 2020-03-23 DIAGNOSIS — R0902 Hypoxemia: Secondary | ICD-10-CM | POA: Diagnosis not present

## 2020-03-23 DIAGNOSIS — M5416 Radiculopathy, lumbar region: Secondary | ICD-10-CM

## 2020-03-23 DIAGNOSIS — J449 Chronic obstructive pulmonary disease, unspecified: Secondary | ICD-10-CM

## 2020-03-23 DIAGNOSIS — Z13220 Encounter for screening for lipoid disorders: Secondary | ICD-10-CM

## 2020-03-23 DIAGNOSIS — Z1231 Encounter for screening mammogram for malignant neoplasm of breast: Secondary | ICD-10-CM | POA: Diagnosis not present

## 2020-03-23 DIAGNOSIS — Z87891 Personal history of nicotine dependence: Secondary | ICD-10-CM

## 2020-03-23 DIAGNOSIS — I10 Essential (primary) hypertension: Secondary | ICD-10-CM

## 2020-03-23 DIAGNOSIS — Z8616 Personal history of COVID-19: Secondary | ICD-10-CM | POA: Diagnosis not present

## 2020-03-23 DIAGNOSIS — Z122 Encounter for screening for malignant neoplasm of respiratory organs: Secondary | ICD-10-CM

## 2020-03-23 NOTE — Telephone Encounter (Signed)
Patient has been notified that the low dose lung cancer screening CT scan is due currently or will be in near future.  Confirmed that patient is within the appropriate age range and asymptomatic, (no signs or symptoms of lung cancer).  Patient denies illness that would prevent curative treatment for lung cancer if found.  Patient is agreeable for CT scan being scheduled.    Verified smoking history (current smoker, with 45 year 0.5 ppd history).   CT scheduled for 04/02/20 @ 10:15

## 2020-03-24 NOTE — Addendum Note (Signed)
Addended by: Lieutenant Diego on: 03/24/2020 11:04 AM   Modules accepted: Orders

## 2020-03-24 NOTE — Telephone Encounter (Signed)
Smoking history: current, 45.5 pack year

## 2020-04-02 ENCOUNTER — Other Ambulatory Visit: Payer: Self-pay

## 2020-04-02 ENCOUNTER — Ambulatory Visit
Admission: RE | Admit: 2020-04-02 | Discharge: 2020-04-02 | Disposition: A | Discharge status: Home / Self Care | Payer: Medicare HMO | Source: Ambulatory Visit | Attending: Oncology | Admitting: Oncology

## 2020-04-02 DIAGNOSIS — F17210 Nicotine dependence, cigarettes, uncomplicated: Secondary | ICD-10-CM | POA: Diagnosis not present

## 2020-04-02 DIAGNOSIS — Z87891 Personal history of nicotine dependence: Secondary | ICD-10-CM | POA: Insufficient documentation

## 2020-04-02 DIAGNOSIS — Z122 Encounter for screening for malignant neoplasm of respiratory organs: Secondary | ICD-10-CM | POA: Diagnosis not present

## 2020-04-06 NOTE — Progress Notes (Signed)
Patient: Theresa [name]  Service Category: E/M  Provider: Gaspar Cola, MD  DOB: 02-14-1956  DOS: 04/08/2020  Location: Office  MRN: [id number]  Setting: Ambulatory outpatient  Referring Provider: Margo Common, PA  Type: Established Patient  Specialty: Interventional Pain Management  PCP: Margo Common, PA  Location: Remote location  Delivery: TeleHealth     Virtual Encounter - Pain Management PROVIDER NOTE: Information contained herein reflects review and annotations entered in association with encounter. Interpretation of such information and data should be left to medically-trained personnel. Information provided to patient can be located elsewhere in the medical record under "Patient Instructions". Document created using STT-dictation technology, any transcriptional errors that may result from process are unintentional.    Contact & Pharmacy Preferred: [phone] Home: [phone] (home) Mobile: [phone] (mobile) E-mail: [email or web address]  [id number] [place] [facility] [address] [id number]: [phone]: [phone]  Pre-screening  [name] "in-person" vs "virtual" encounter. She indicated preferring virtual for this encounter.   Reason COVID-19*  Social distancing based on CDC and AMA recommendations.   I contacted SFranco [name] 04/08/2020 via telephone.      I clearly identified myself as FGaspar Cola MD. I verified that I was speaking with the correct person using two identifiers (Name: SAshlley [name] and date of birth: 812-09-1956.  Consent I sought verbal advanced consent from SFranco [name] virtual visit interactions. I informed [name] possible security and privacy concerns, risks, and limitations associated with providing "not-in-person" medical evaluation and management services. I also informed [name] the availability of "in-person" appointments. Finally, I informed her  that there would be a charge for the virtual visit and that she could be  personally, fully or partially, financially responsible for it. [name] understanding and agreed to proceed.   Historic Elements   [name] a 64y.o. year old, female patient evaluated today after her last contact with our practice on Visit date not found. [name] has a past medical history of Acute postoperative pain (10/03/2017), Anxiety, BiPAP (biphasic positive airway pressure) dependence, Carpal tunnel syndrome, CHF [place]congestive heart [place], [place]est[place], Chronic generalized abdominal pain, Chronic rhinitis, COPD (chronic obstructive pulmonary disease) ([place], DDD (degenerative disc disease), cervical, DDD (degenerative disc disease), lumbosacral, Depression, Dyspnea, Edema, Flu, Gastritis, GERD (gastroesophageal reflux disease), Hematuria, Hernia of abdominal wall (07/17/2018), Hernia, abdominal, Hypertension, Kidney stones, Low back pain, Lumbar radiculitis, Malodorous urine, Muscle weakness, Obesity, Oxygen dependent, Renal cyst, Sensory urge incontinence, Thyroid activity decreased, Tobacco abuse, and Wheezing. She also  has a past surgical history that includes Abdominal hysterectomy; Tonsillectomy; Carpal tunnel release (Left, 2012); Tubal ligation; epigastric hernia repair (N/A, 08/13/2018); Umbilical hernia repair (N/A, 08/13/2018); and RIGHT/LEFT HEART CATH AND CORONARY ANGIOGRAPHY (N/A, 07/11/2019). [name] a current medication list which includes the following prescription(s): albuterol, aspirin ec, baclofen, cholecalciferol, digoxin, fluticasone-umeclidin-vilant, gabapentin, hydrocodone-acetaminophen, ipratropium-albuterol, levothyroxine, potassium chloride sa, sertraline, and torsemide. She  reports that she has been smoking cigarettes. She has a 22.00 pack-year smoking history. She has never used smokeless tobacco. She reports that she does not drink  alcohol and does not use drugs. [name] allergic to codeine and meloxicam.   HPI  Today, she is being contacted for medication management. The patient indicates doing well with the current medication regimen. No adverse reactions or side effects reported to the medications.  The patient has indicated  that she has lost approximately 60 pounds and this seems to be helping her considerably.  During today's encounter she was clearly not as short of breath as she normally is.  In addition, I am sure that it will help with her back pain.  As soon as she can bring her BMI below 35, she would probably be an excellent candidate for radiofrequency ablation.  She has indicated that she is interested in avoiding back surgery.  Pharmacotherapy Assessment  Analgesic: Hydrocodone/APAP 5/325 one tablet every 8 hours (15 mg/day of hydrocodone) MME/day:15 mg/day.   Monitoring: [place] PMP: PDMP reviewed during this encounter.       Pharmacotherapy: No side-effects or adverse reactions reported. Compliance: No problems identified. Effectiveness: Clinically acceptable. Plan: Refer to "POC".  UDS:  Summary  Date Value Ref Range Status  07/31/2018 FINAL  Final    Comment:    ==================================================================== TOXASSURE SELECT 13 (MW) ==================================================================== Test                             Result       Flag       Units Drug Present and Declared for Prescription Verification   Hydrocodone                    688          EXPECTED   ng/mg creat   Norhydrocodone                 552          EXPECTED   ng/mg creat    Sources of hydrocodone include scheduled prescription    medications. Norhydrocodone is an expected metabolite of    hydrocodone. Drug Absent but Declared for Prescription Verification   Alprazolam                     Not Detected UNEXPECTED ng/mg  creat ==================================================================== Test                      Result    Flag   Units      Ref Range   Creatinine              25               mg/dL      >=20 ==================================================================== Declared Medications:  The flagging and interpretation on this report are based on the  following declared medications.  Unexpected results may arise from  inaccuracies in the declared medications.  **Note: The testing scope of this panel includes these medications:  Alprazolam  Hydrocodone (Hydrocodone-Acetaminophen)  **Note: The testing scope of this panel does not include following  reported medications:  Acetaminophen (Hydrocodone-Acetaminophen)  Albuterol  Albuterol (Ipratropium-Albuterol)  Aspirin (Aspirin 81)  Bisoprolol (Ziac)  Fluticasone (Advair)  Gabapentin  Hydrochlorothiazide (Ziac)  Ipratropium (Ipratropium-Albuterol)  Levothyroxine  Oxygen  Potassium (K-Dur)  Ranitidine  Roflumilast  Salmeterol (Advair)  Sertraline  Torsemide ==================================================================== For clinical consultation, please call [phone]. ====================================================================    Laboratory Chemistry Profile   Renal Lab Results  Component Value Date   BUN 12 03/04/2020   CREATININE 0.75 03/04/2020   BCR 16 03/04/2020   GFRAA 99 03/04/2020   GFRNONAA 86 03/04/2020     Hepatic Lab Results  Component Value Date   AST 21 03/04/2020   ALT 16 03/04/2020   ALBUMIN 3.8 03/04/2020   ALKPHOS 149 (  H) 03/04/2020   LIPASE 31 10/17/2017   AMMONIA 40 (H) 10/11/2019     Electrolytes Lab Results  Component Value Date   NA 139 03/04/2020   K 3.9 03/04/2020   CL 100 03/04/2020   CALCIUM 9.2 03/04/2020   MG 2.7 (H) 02/10/2020   PHOS 4.0 02/10/2020     Bone Lab Results  Component Value Date   25OHVITD1 13 (L) 09/17/2015   25OHVITD2 <1.0 09/17/2015    25OHVITD3 13 09/17/2015     Inflammation (CRP: Acute Phase) (ESR: Chronic Phase) Lab Results  Component Value Date   CRP 4.2 (H) 02/08/2020   ESRSEDRATE 16 08/08/2017   LATICACIDVEN 1.4 02/03/2020       Note: Above Lab results reviewed.  Imaging  CT CHEST LUNG CANCER SCREENING LOW DOSE WO CONTRAST CLINICAL DATA:  Forty-five pack-year smoking history, current smoker.  EXAM: CT CHEST WITHOUT CONTRAST LOW-DOSE FOR LUNG CANCER SCREENING  TECHNIQUE: Multidetector CT imaging of the chest was performed following the standard protocol without IV contrast.  COMPARISON:  Chest radiograph 02/06/2020. Diagnostic chest CT 07/07/2019. Screening CT 03/25/2019.  FINDINGS: Cardiovascular: Aortic atherosclerosis. Tortuous thoracic aorta. Mild cardiomegaly, without pericardial effusion. Multivessel coronary artery atherosclerosis.  Pulmonary artery enlargement, outflow tract 3.6 cm.  Mediastinum/Nodes: Multiple small middle mediastinal nodes, none pathologic by size criteria. Hilar regions poorly evaluated without intravenous contrast.  Lungs/Pleura: No pleural fluid. Moderate centrilobular emphysema. Lingular and less so right middle lobe scarring. Right-sided pulmonary nodules of maximally volume derived equivalent diameter 4.3 mm. No dominant or significantly enlarged pulmonary nodule.  Upper Abdomen: Low-density bilateral liver lesions are similar, likely cysts. Normal imaged portions of the spleen, stomach, pancreas, adrenal glands, kidneys.  Musculoskeletal: No acute osseous abnormality.  IMPRESSION: 1. Lung-RADS 2, benign appearance or behavior. Continue annual screening with low-dose chest CT without contrast in 12 months. 2. Pulmonary artery enlargement suggests pulmonary arterial hypertension. 3. Aortic Atherosclerosis (ICD10-I70.0) and Emphysema (ICD10-J43.9). 4. Age advanced coronary artery atherosclerosis. Recommend assessment of coronary risk factors and  consideration of medical therapy.  Electronically Signed   By: Abigail Miyamoto M.D.   On: 04/03/2020 11:41  Assessment  The primary encounter diagnosis was Chronic pain syndrome (significant psychosocial component). Diagnoses of Chronic low back pain (Primary Area of Pain) (Bilateral) (L>R), Chronic lower extremity pain (Secondary Area of Pain) (Left), Pharmacologic therapy, and Uncomplicated opioid dependence (HCC) were also pertinent to this visit.  Plan of Care  Problem-specific:  No problem-specific Assessment & Plan notes found for this encounter.  Ms. Lynessa Almanzar has a current medication list which includes the following long-term medication(s): albuterol, baclofen, digoxin, gabapentin, hydrocodone-acetaminophen, ipratropium-albuterol, levothyroxine, potassium chloride sa, sertraline, and torsemide.  Pharmacotherapy (Medications Ordered): Meds ordered this encounter  Medications  . HYDROcodone-acetaminophen (NORCO/VICODIN) 5-325 MG tablet    Sig: Take 1 tablet by mouth every 8 (eight) hours as needed for severe pain. Must last 30 days    Dispense:  90 tablet    Refill:  0    Chronic Pain: STOP Act (Not applicable) Fill 1 day early if closed on refill date. Do not fill until: 04/08/2020. To last until: 05/08/2020. Avoid benzodiazepines within 8 hours of opioids   Orders:  Orders Placed This Encounter  Procedures  . ToxASSURE Select 13 (MW), Urine    Volume: 30 ml(s). Minimum 3 ml of urine is needed. Document temperature of fresh sample. Indications: Long term (current) use of opiate analgesic (K59.977)    Order Specific Question:   Release to patient  Answer:   Immediate   Follow-up plan:   Return in about 4 weeks (around 05/06/2020) for (F2F), (MM).      Interventional management options: Planned, scheduled, and/or pending: NOTE: NO further Lumbar RFAuntil BMI <35. Diagnostic left L2-3 LESI #1 today   Considering: Palliative procedures    Palliative PRN  treatment(s): Palliativebilaterallumbar facetblock #4 Diagnostic/palliative left L4TFESI #2 Diagnostic/palliative left L4-5 LESI #3 Diagnostic/palliativerightL4 TFESI #2 Diagnostic/palliativerightL4-5 LESI #2 Palliative leftlumbar facet RFA#2(done on 06/27/2017) Palliative right lumbar facet RFA#2(done on 10/03/2017)       Recent Visits Date Type Provider Dept  01/13/20 Telemedicine Milinda Pointer, MD [facility]  Showing recent visits within past 90 days and meeting all other requirements Today's Visits Date Type Provider Dept  04/08/20 Telemedicine Milinda Pointer, MD [facility]  Showing today's visits and meeting all other requirements Future Appointments No visits were found meeting these conditions. Showing future appointments within next 90 days and meeting all other requirements  I discussed the assessment and treatment plan with the patient. The patient was provided an opportunity to ask questions and all were answered. The patient agreed with the plan and demonstrated an understanding of the instructions.  Patient advised to call back or seek an in-person evaluation if the symptoms or condition worsens.  Duration of encounter: 18 minutes.  Note by: Gaspar Cola, MD Date: 04/08/2020; Time: 1:40 PM

## 2020-04-07 ENCOUNTER — Encounter: Payer: Self-pay | Admitting: Pain Medicine

## 2020-04-07 ENCOUNTER — Encounter: Payer: Self-pay | Admitting: *Deleted

## 2020-04-08 ENCOUNTER — Ambulatory Visit: Payer: Medicare HMO | Attending: Pain Medicine | Admitting: Pain Medicine

## 2020-04-08 ENCOUNTER — Other Ambulatory Visit: Payer: Self-pay

## 2020-04-08 DIAGNOSIS — G8929 Other chronic pain: Secondary | ICD-10-CM | POA: Diagnosis not present

## 2020-04-08 DIAGNOSIS — F1120 Opioid dependence, uncomplicated: Secondary | ICD-10-CM | POA: Insufficient documentation

## 2020-04-08 DIAGNOSIS — M79605 Pain in left leg: Secondary | ICD-10-CM | POA: Diagnosis not present

## 2020-04-08 DIAGNOSIS — G894 Chronic pain syndrome: Secondary | ICD-10-CM

## 2020-04-08 DIAGNOSIS — J449 Chronic obstructive pulmonary disease, unspecified: Secondary | ICD-10-CM | POA: Diagnosis not present

## 2020-04-08 DIAGNOSIS — Z79899 Other long term (current) drug therapy: Secondary | ICD-10-CM | POA: Insufficient documentation

## 2020-04-08 DIAGNOSIS — M545 Low back pain: Secondary | ICD-10-CM | POA: Diagnosis not present

## 2020-04-08 MED ORDER — HYDROCODONE-ACETAMINOPHEN 5-325 MG PO TABS: 1.0000 | ORAL_TABLET | Freq: Three times a day (TID) | ORAL | 0 refills | Status: DC | PRN

## 2020-04-14 DIAGNOSIS — G894 Chronic pain syndrome: Secondary | ICD-10-CM | POA: Diagnosis not present

## 2020-04-14 DIAGNOSIS — Z79899 Other long term (current) drug therapy: Secondary | ICD-10-CM | POA: Diagnosis not present

## 2020-04-21 DIAGNOSIS — J9610 Chronic respiratory failure, unspecified whether with hypoxia or hypercapnia: Secondary | ICD-10-CM | POA: Diagnosis not present

## 2020-04-21 LAB — TOXASSURE SELECT 13 (MW), URINE

## 2020-04-24 IMAGING — MR MR HEAD W/O CM
10 of 12 series · 31 of 48 positions shown · non-contrast
Comparison: CT head without contrast 10/11/2019. MR head without
contrast 12/10/2017

CLINICAL DATA: Patient found on floor. Fall. Confusion.

EXAM:
MRI HEAD WITHOUT CONTRAST
TECHNIQUE: Multiplanar, multiecho pulse sequences of the brain and surrounding
structures were obtained without intravenous contrast.

[Series 2: DWI · axial · 3.0mm · 0.94mm/px · z∈[-113,+34]mm · 6 of 100 slices shown (1 of 2)]
[im 1/100]
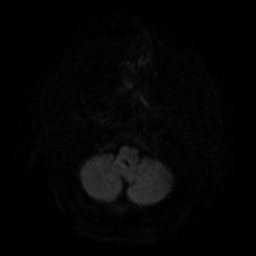
[im 20/100]
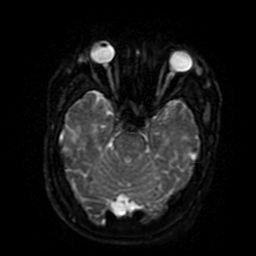
[im 40/100]
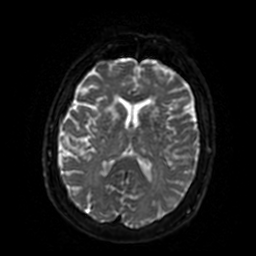
[im 60/100]
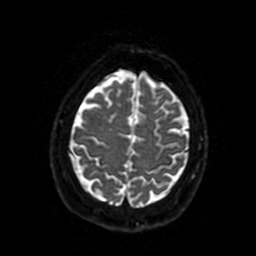
[im 80/100]
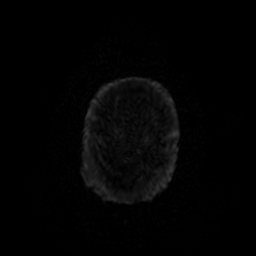
[im 100/100]
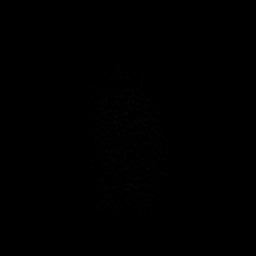

[Series 3: DWI · coronal · 4.0mm · 0.94mm/px · 5 of 72 slices shown (2 of 2)]
[im 1/72]
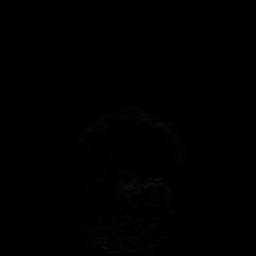
[im 18/72]
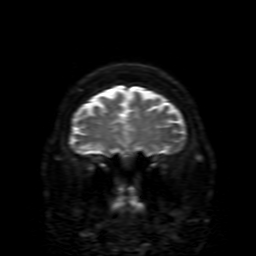
[im 36/72]
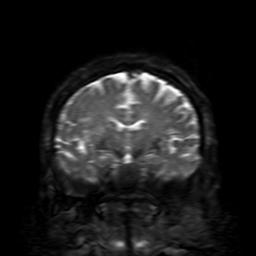
[im 54/72]
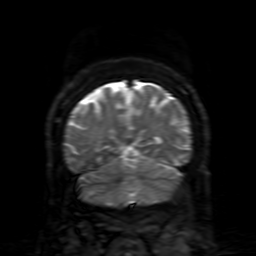
[im 72/72]
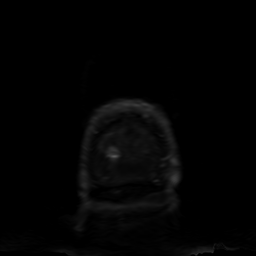

[Series 4: FLAIR · sagittal · 5.0mm · 0.47mm/px · 2 of 25 slices shown (1 of 3)]
[im 1/25]
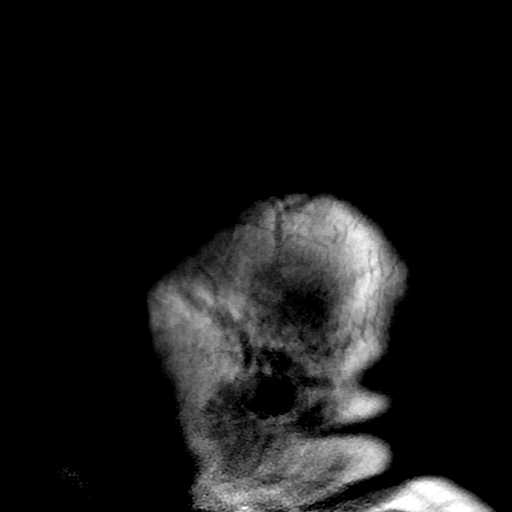
[im 25/25]
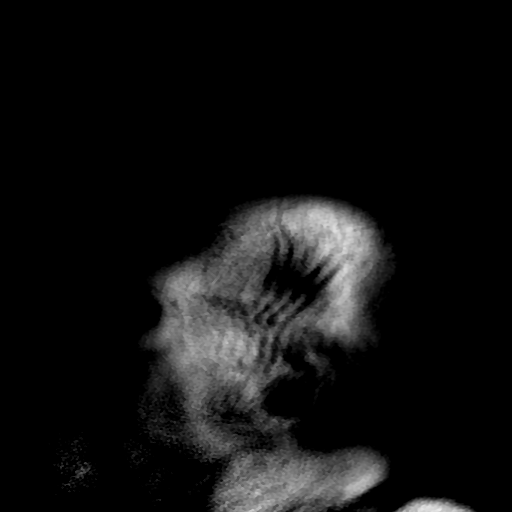

[Series 6: FLAIR · axial · 3.0mm · 0.47mm/px · z∈[-151,+16]mm · 2 of 29 slices shown (2 of 3)]
[im 1/29]
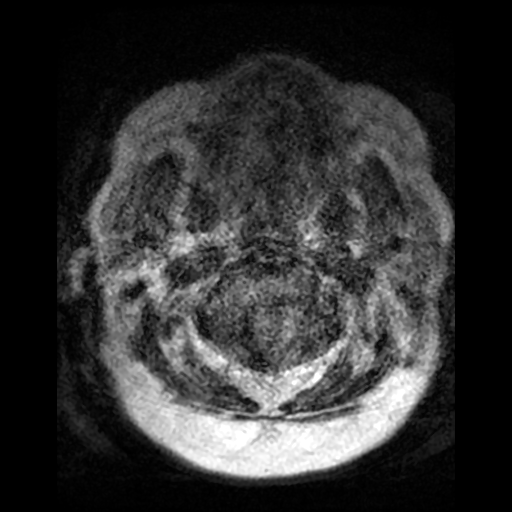
[im 29/29]
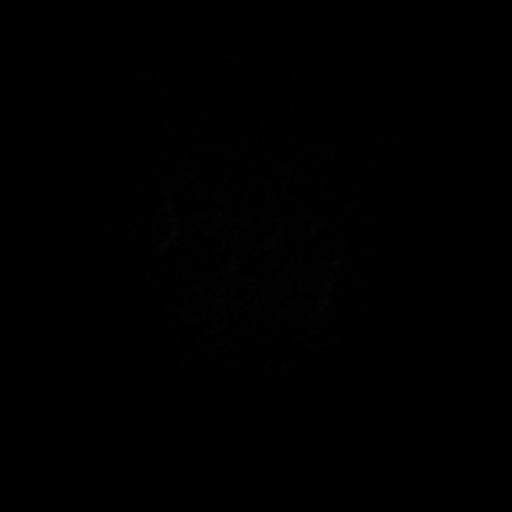

[Series 7: SWI · axial · 3.0mm · 0.47mm/px · z∈[-153,-105]mm · 3 of 116 slices shown]
[im 1/116]
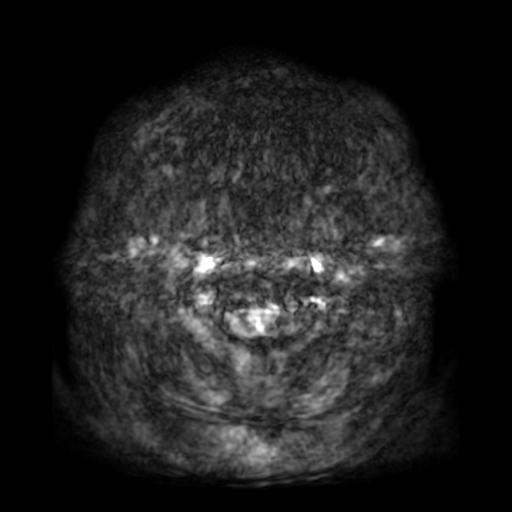
[im 17/116]
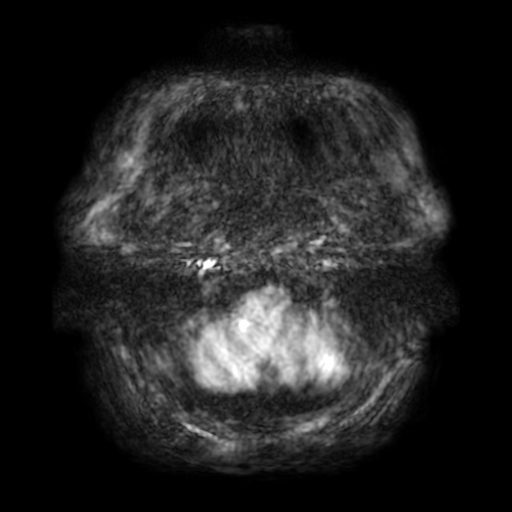
[im 33/116]
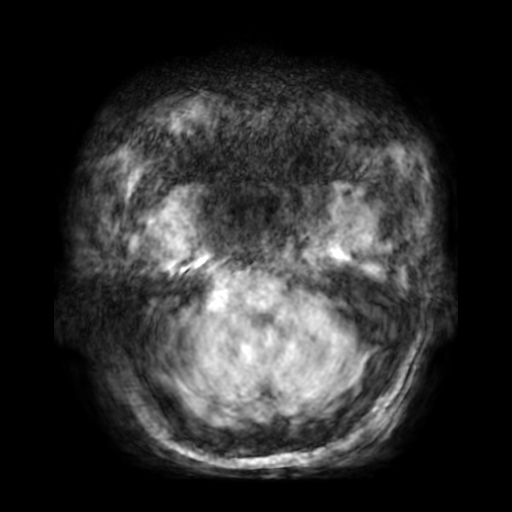

[Series 8: FLAIR · axial · 3.0mm · 0.47mm/px · z∈[-151,+16]mm · 2 of 29 slices shown (3 of 3)]
[im 1/29]
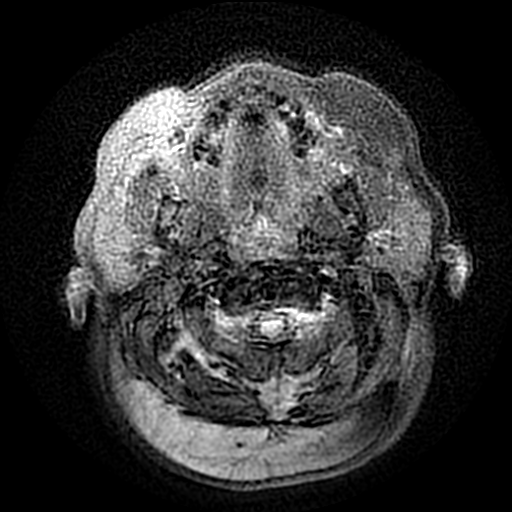
[im 29/29]
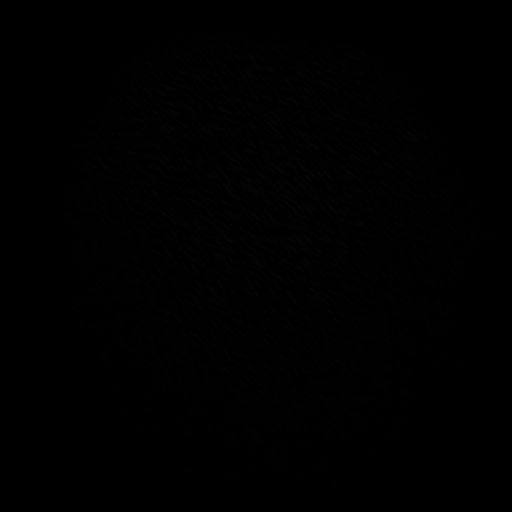

[Series 9: T2 · axial · 3.0mm · 0.47mm/px · z∈[-151,+16]mm · 2 of 29 slices shown (1 of 2)]
[im 1/29]
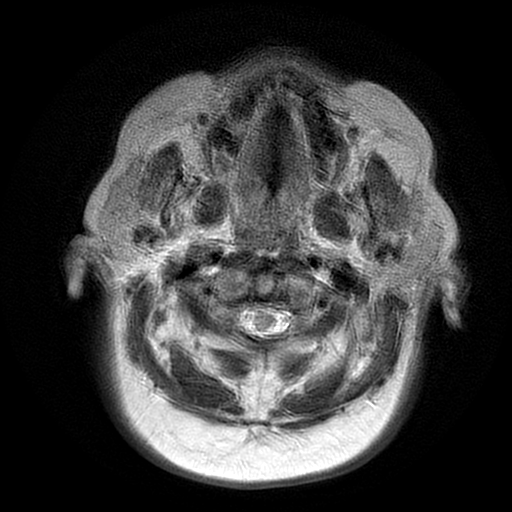
[im 29/29]
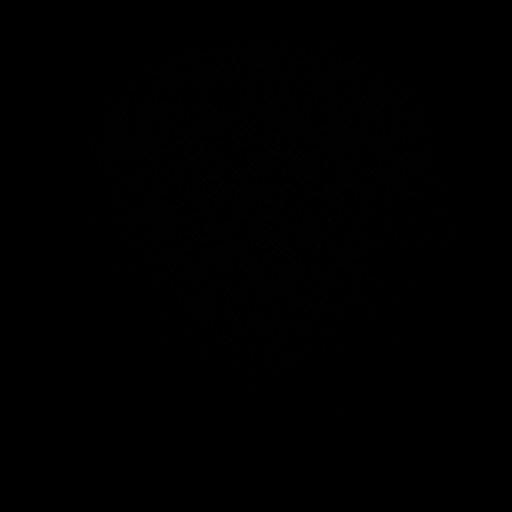

[Series 10: T2 · coronal · 5.0mm · 0.39mm/px · 2 of 30 slices shown (2 of 2)]
[im 1/30]
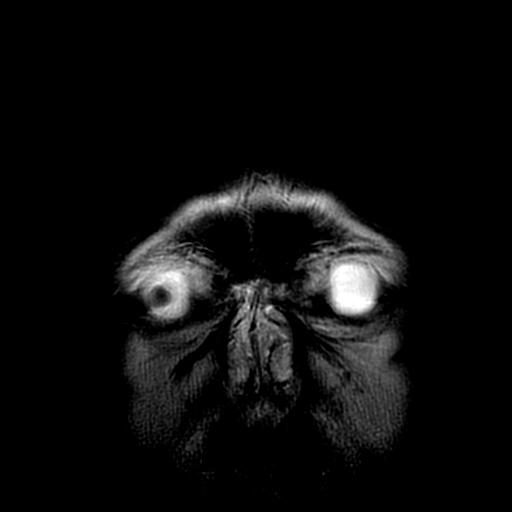
[im 30/30]
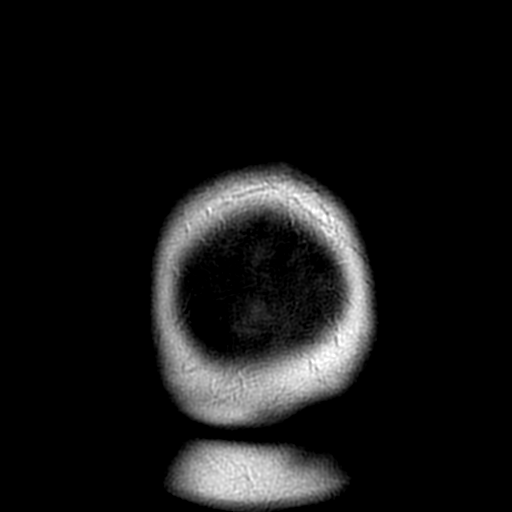

[Series 250: ADC · axial · 3.0mm · 0.94mm/px · z∈[-113,+34]mm · 4 of 50 slices shown (1 of 2)]
[im 1/50]
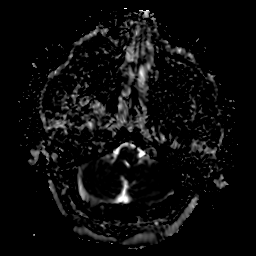
[im 17/50]
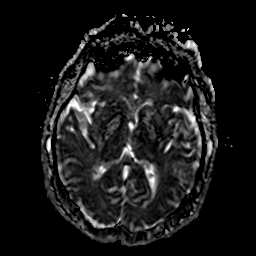
[im 33/50]
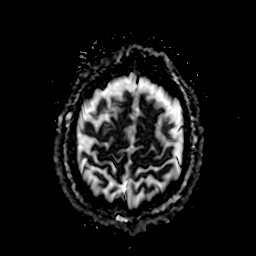
[im 50/50]
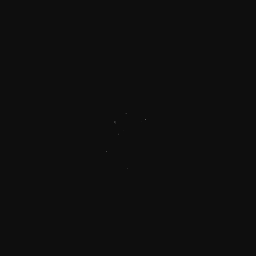

[Series 350: ADC · coronal · 4.0mm · 0.94mm/px · 3 of 36 slices shown (2 of 2)]
[im 1/36]
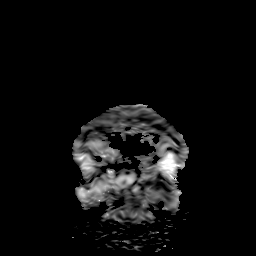
[im 18/36]
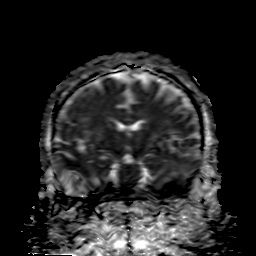
[im 36/36]
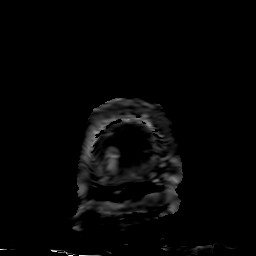

[31 of 48 positions shown; findings below may reference images not displayed]

FINDINGS: Brain: Chronic T2 hyperintensities within the internal capsule
bilaterally and basal ganglia are stable. No acute infarct,
hemorrhage, or mass lesion is present. White matter changes
extending into the brainstem are stable. Remote ischemic changes are
again noted in the thalami bilaterally, not significantly changed.
The ventricles are of normal size. No significant extraaxial fluid
collection is present.

Vascular: Flow is present in the major intracranial arteries.

Skull and upper cervical spine: The craniocervical junction is
normal. Upper cervical spine is within normal limits. Marrow signal
is unremarkable.

Sinuses/Orbits: The paranasal sinuses and mastoid air cells are
clear. Left lens replacement is present. Globes and orbits are
otherwise unremarkable.
IMPRESSION: 1. No acute intracranial abnormality or significant interval change.
2. Stable atrophy and white matter disease. This likely reflects the
sequela of chronic microvascular ischemia.
3. The study is moderately degraded by patient motion.

## 2020-04-24 IMAGING — DX DG SHOULDER 2+V*L*
2 series · 2 of 2 positions shown · non-contrast
Comparison: Chest radiograph dated 07/06/2019.

CLINICAL DATA: 63-year-old female with fall and left shoulder pain.

EXAM:
LEFT SHOULDER - 2+ VIEW

[x shoulder ap left (1 of 2)]
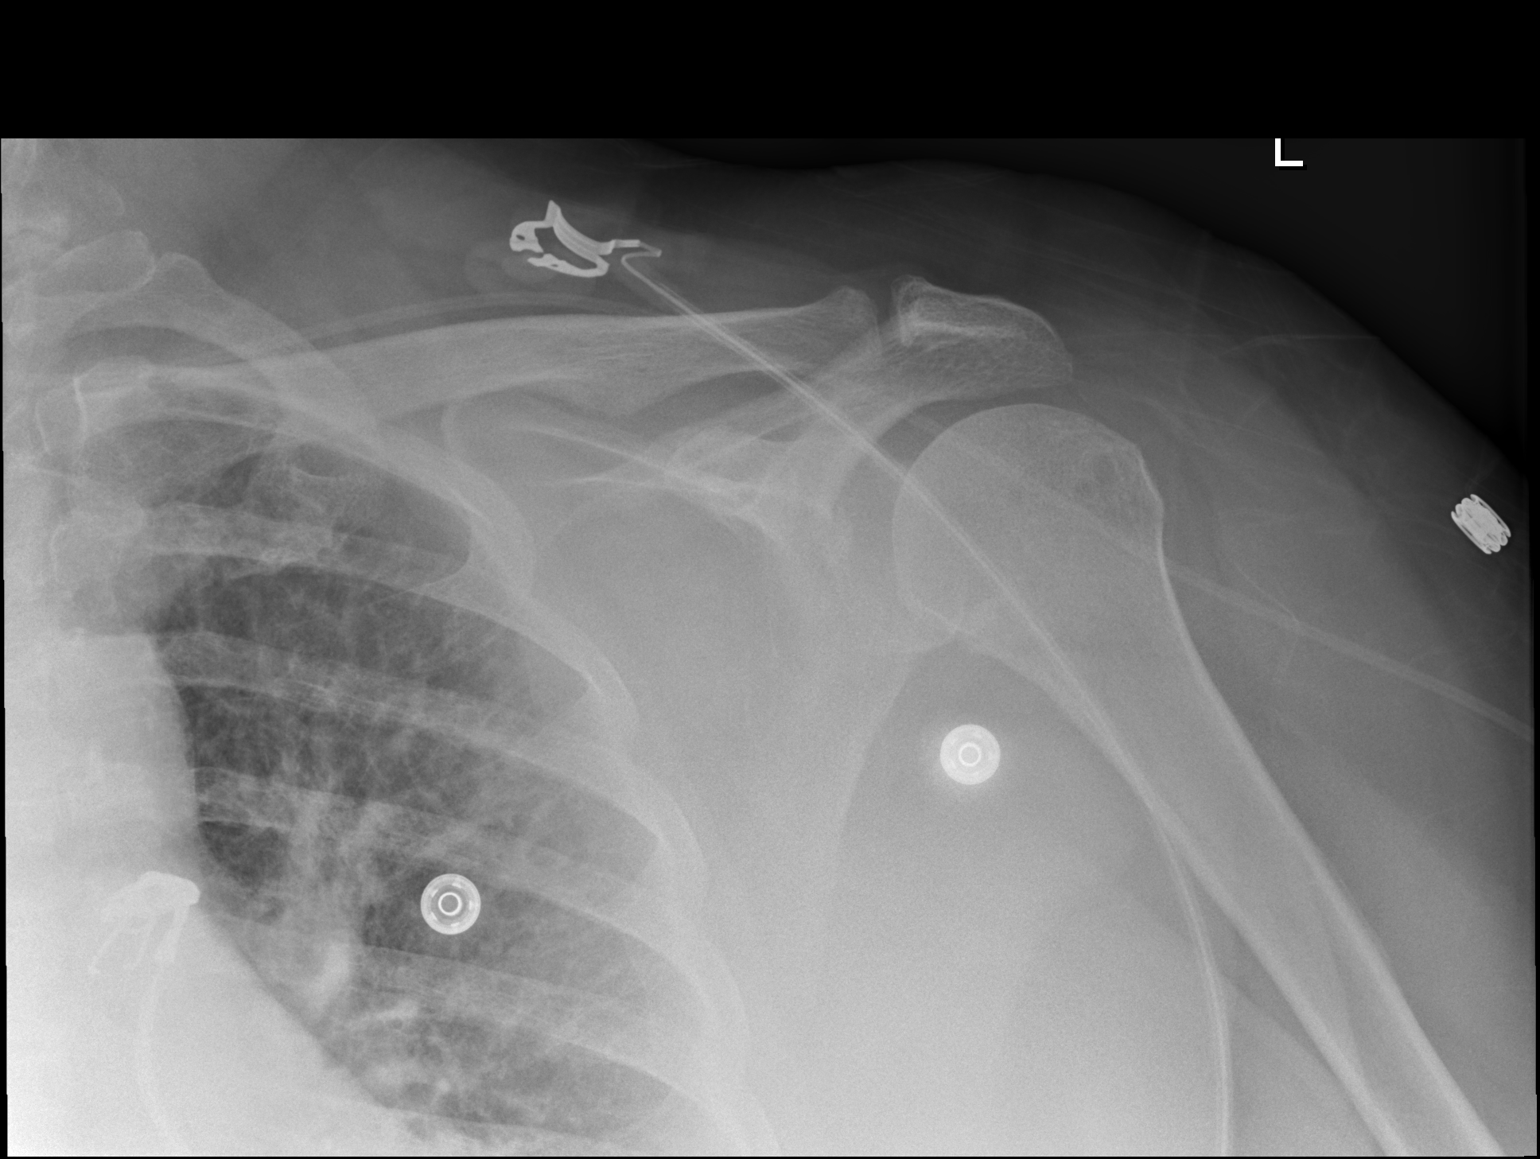

[x shoulder ap left (2 of 2)]
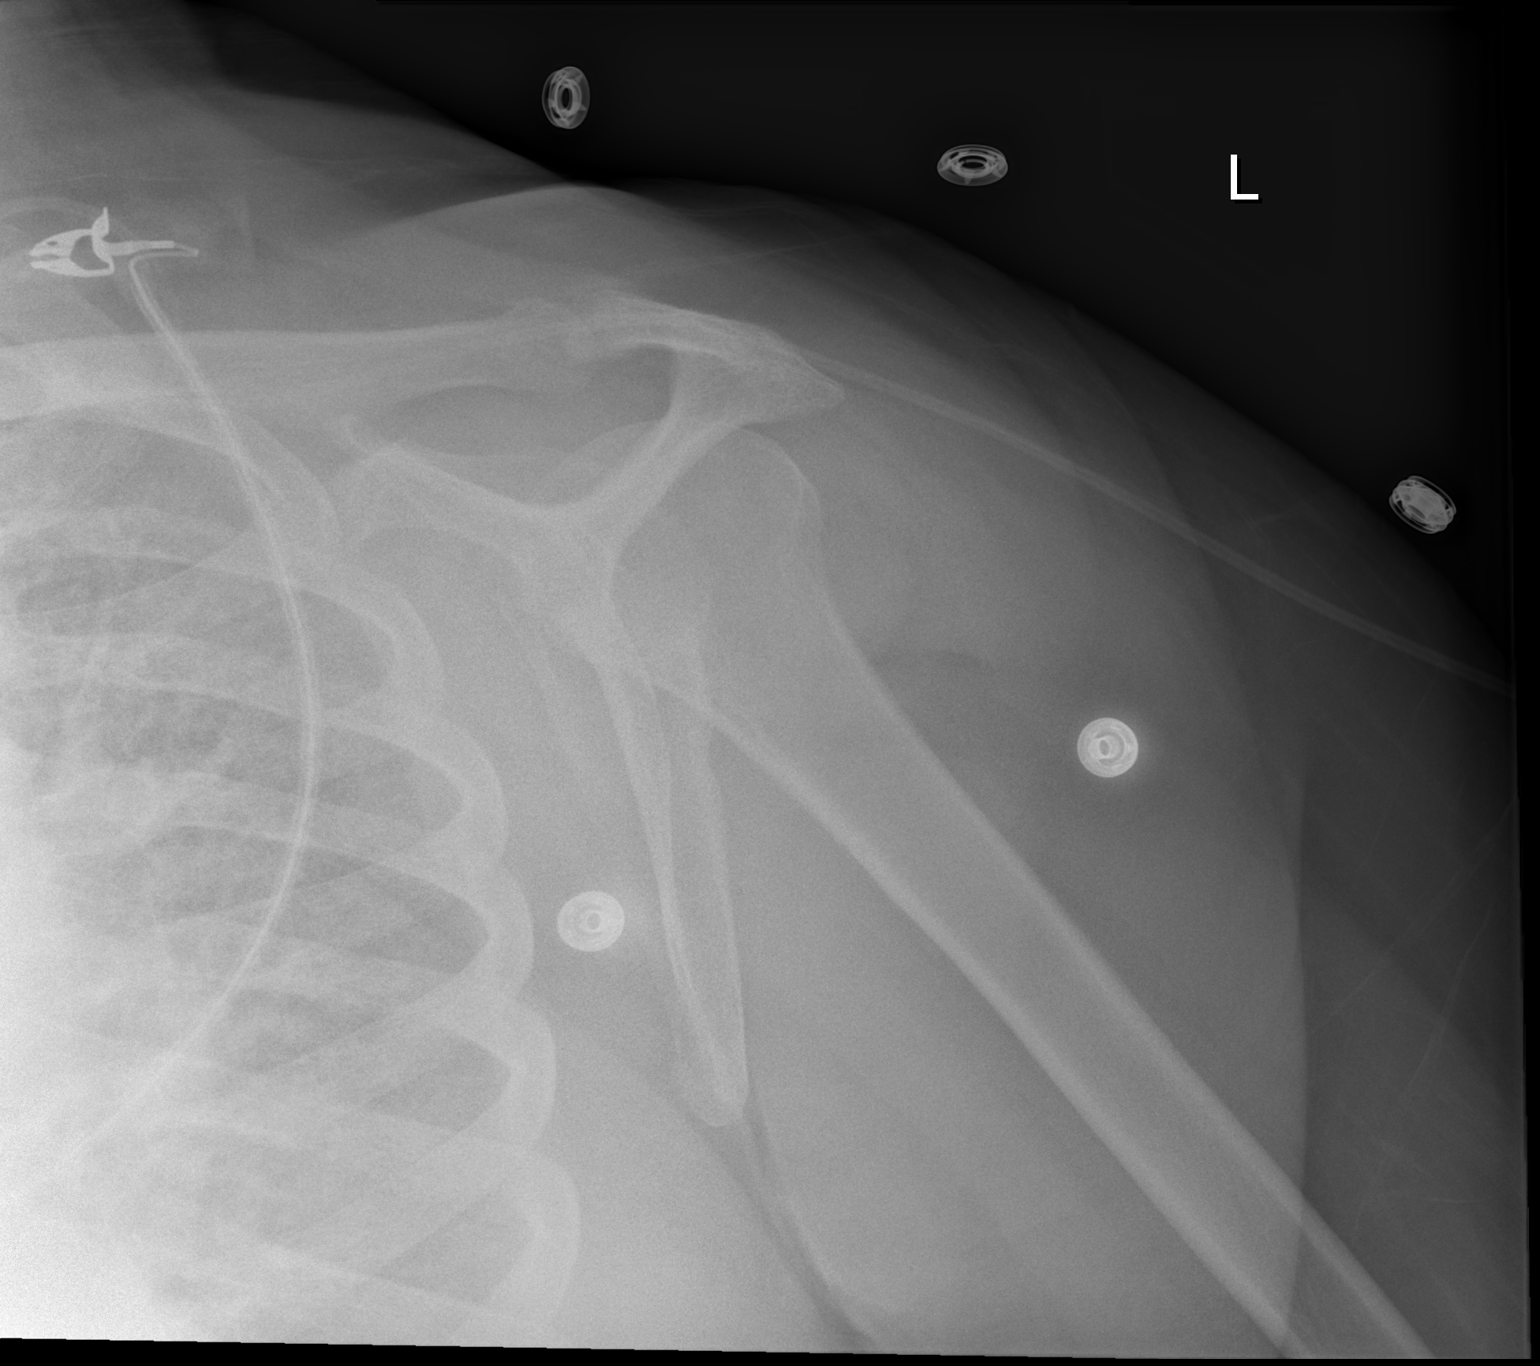

[2 of 2 positions shown; findings below may reference images not displayed]

FINDINGS: There is no acute fracture or dislocation. Evaluation is limited
however as the axial view is not provided and the Y-view is not a
true lateral projection. The bones are osteopenic. No significant
arthritic changes. There is degenerative changes of the left AC
joint. The soft tissues are unremarkable.

Diffuse vascular and interstitial prominence of the visualized left
lung may represent congestion or edema. Clinical correlation is
recommended.
IMPRESSION: 1. No acute fracture or dislocation.
2. Diffuse vascular and interstitial prominence of the visualized
left lung may represent congestion or edema. Clinical correlation is
recommended.

## 2020-04-27 DIAGNOSIS — I272 Pulmonary hypertension, unspecified: Secondary | ICD-10-CM | POA: Diagnosis not present

## 2020-04-27 DIAGNOSIS — J449 Chronic obstructive pulmonary disease, unspecified: Secondary | ICD-10-CM | POA: Diagnosis not present

## 2020-04-27 DIAGNOSIS — J189 Pneumonia, unspecified organism: Secondary | ICD-10-CM | POA: Diagnosis not present

## 2020-04-27 DIAGNOSIS — R0602 Shortness of breath: Secondary | ICD-10-CM | POA: Diagnosis not present

## 2020-04-27 DIAGNOSIS — R0600 Dyspnea, unspecified: Secondary | ICD-10-CM | POA: Diagnosis not present

## 2020-05-05 IMAGING — CR DG CHEST 2V
1 series · 2 of 2 positions shown · non-contrast
Comparison: 07/06/2019

CLINICAL DATA: Shortness of breath

EXAM:
CHEST - 2 VIEW

[Series 1: dg chest 2 view · 0.14mm/px · 2 of 2 slices shown]
[im 1/2]
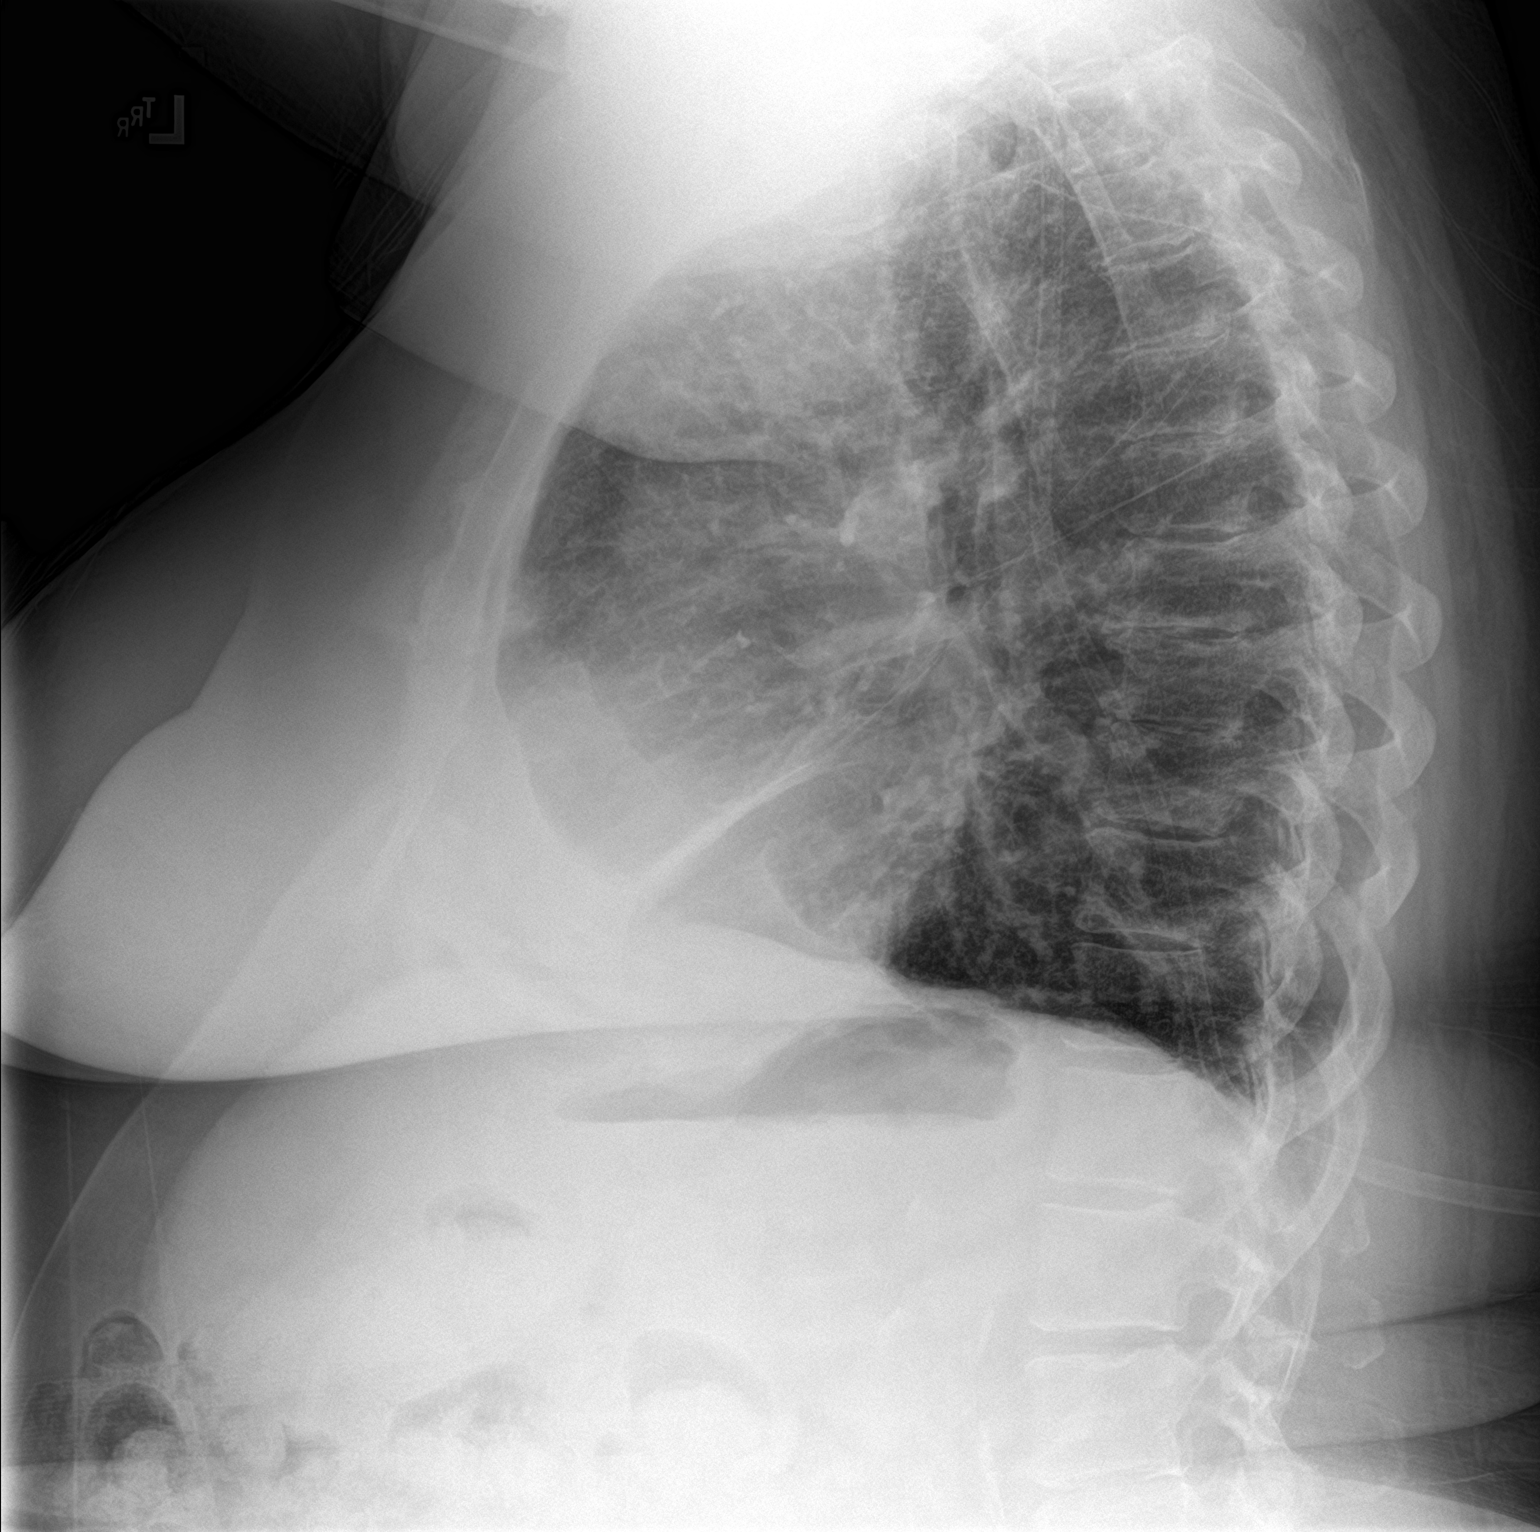
[im 2/2]
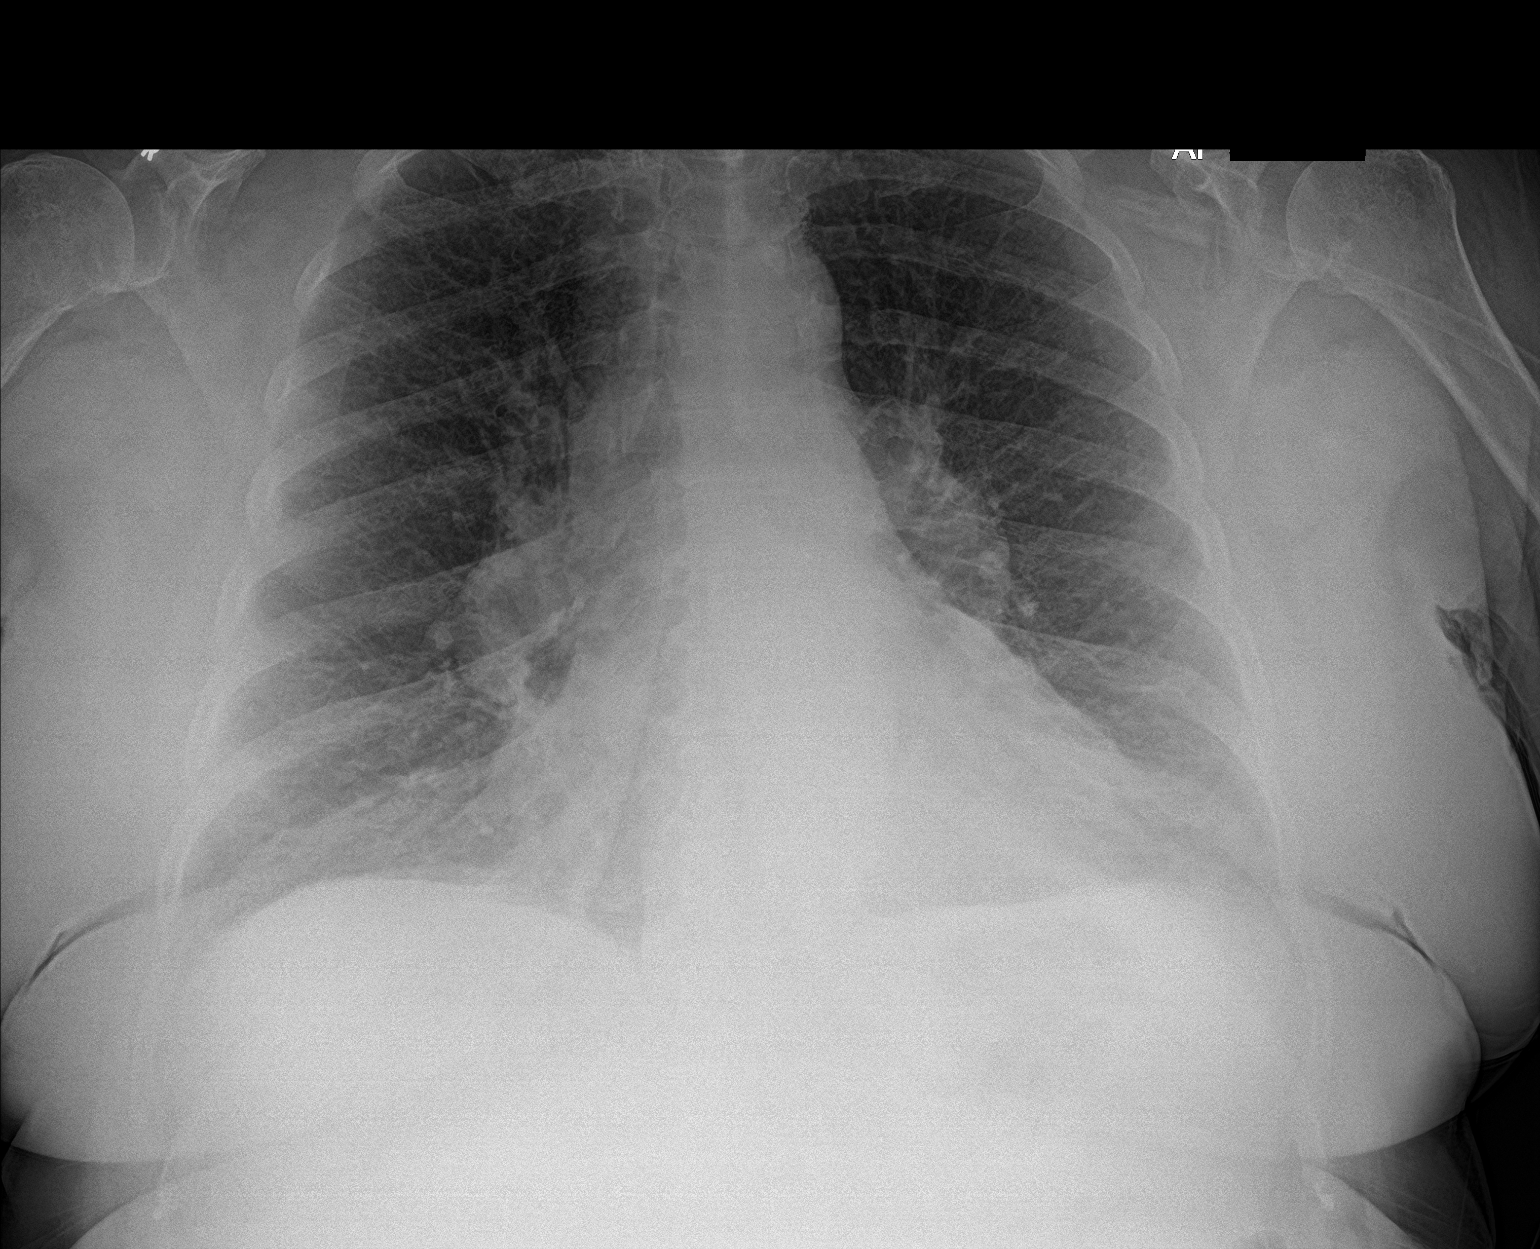

[2 of 2 positions shown; findings below may reference images not displayed]

FINDINGS: Cardiac shadows within normal limits. The lungs are well aerated
bilaterally. No focal infiltrate or sizable effusion is seen. Stable
interstitial opacities are noted. No bony abnormality is seen.
IMPRESSION: Chronic interstitial markings stable from the prior exam.

## 2020-05-05 NOTE — Progress Notes (Signed)
PROVIDER NOTE: Information contained herein reflects review and annotations entered in association with encounter. Interpretation of such information and data should be left to medically-trained personnel. Information provided to patient can be located elsewhere in the medical record under "Patient Instructions". Document created using STT-dictation technology, any transcriptional errors that may result from process are unintentional.    Patient: Theresa [name]  Service Category: E/M  Provider: Gaspar Cola, MD  DOB: September 03, 1956  DOS: 05/06/2020  Specialty: Interventional Pain Management  MRN: [id number]  Setting: Ambulatory outpatient  PCP: Margo Common, PA  Type: Established Patient    Referring Provider: Margo Common, PA  Location: Office  Delivery: Face-to-face     HPI  Reason for encounter: Theresa [name], a 64 y.o. year old female, is here today for evaluation and management of her Chronic pain syndrome [G89.4]. Theresa [name] primary complain today is Back Pain (low) Last encounter: Practice (Visit date not found). My last encounter with her was on Visit date not found. Pertinent problems: Theresa [name] has Neuritis or radiculitis due to rupture of lumbar intervertebral disc; Decreased motor strength; DDD (degenerative disc disease), lumbar; Carpal tunnel syndrome; Neurogenic pain; Neuropathic pain; Musculoskeletal pain; Lumbar spondylosis (L4-5); Chronic low back pain (Primary Area of Pain) (Bilateral) (L>R); Chronic lower extremity pain (Secondary Area of Pain) (Left); Chronic lumbar radicular pain (L4 & S1 Dermatome) (Left); Chronic shoulder pain (Right side); Chronic upper extremity pain (Right-sided); Cervical radiculitis (Right side); Abnormal x-ray of lumbar spine; Chronic pain syndrome (significant psychosocial component); 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; 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); Lumbar facet syndrome (Bilateral) (L>R); Chronic  sacroiliac joint pain (Bilateral); Lumbar facet hypertrophy (multilevel); Lumbar spinal stenosis (L4-5); 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); 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 Pain Assessment: Severity of Chronic pain is reported as a 8 /10. Location: Back Lower/ . Onset: More than a month ago. Quality: Throbbing, Constant, Stabbing. Timing: Intermittent. Modifying factor(s): sitting or resting,medication, squatting type maneuver, topicals. Vitals:  height is _0  (1.575 m) and weight is 197 lb (89.4 kg). Her blood pressure is 111/67 and her pulse is 80. Her respiration is 18 and oxygen saturation is 95%.    The patient indicates doing well with the current medication regimen. No adverse reactions or side effects reported to the medications.  The patient comes into the clinics today having lost approximately 60 pounds.  She is doing much better and she is not having as much difficulty breathing, has a lot more energy, and does not get as fatigued with mild exercise.  I have encouraged her to continue with this and as soon as we get to a reasonable level, then we will be able to do radiofrequency ablation for the purpose of improving that facet pain.  However, if she loses enough weight, she may not continue to have back pain.  Pharmacotherapy Assessment   Analgesic: Hydrocodone/APAP 5/325 one tablet  every 8 hours (15 mg/day of hydrocodone) MME/day:15 mg/day.   Monitoring:  PMP: PDMP reviewed during this encounter.  Pharmacotherapy: No side-effects or adverse reactions reported. Compliance: No problems identified. Effectiveness: Clinically acceptable.  Hart Rochester, RN  05/06/2020  1:16 PM  Sign when Signing Visit Nursing Pain Medication Assessment:  Safety precautions to be maintained throughout the outpatient stay will include: orient to surroundings, keep bed in low position, maintain call bell within reach at all times, provide assistance with transfer out of bed and ambulation.  Medication Inspection Compliance: Theresa [name] did not comply with our request to bring her pills to be counted. She was reminded that bringing the medication bottles, even when empty, is a requirement.  Medication: Hydrocodone/APAP Pill/Patch Count: No pills available to be counted. Pill/Patch Appearance: did not bring pills to count Bottle Appearance: No container available. Did not bring bottle(s) to appointment. Filled Date: . / . / 2021 Last Medication intake:  Today    UDS:  Summary  Date Value Ref Range Status  04/14/2020 Note  Final    Comment:    ==================================================================== [employer] (MW) ==================================================================== Test                             Result       Flag       Units  Drug Present and Declared for Prescription Verification   Hydrocodone                    2142         EXPECTED   ng/mg creat   Hydromorphone                  263          EXPECTED   ng/mg creat   Dihydrocodeine                 156          EXPECTED   ng/mg creat   Norhydrocodone                 2151         EXPECTED   ng/mg creat    Sources of hydrocodone include scheduled prescription medications.    Hydromorphone, dihydrocodeine and norhydrocodone are expected    metabolites of hydrocodone. Hydromorphone  and dihydrocodeine are    also available as scheduled prescription medications.  ==================================================================== Test                      Result    Flag   Units      Ref Range   Creatinine              43               mg/dL      >=20 ==================================================================== Declared Medications:  The flagging and interpretation on this report are based on the  following declared medications.  Unexpected results may arise from  inaccuracies in the declared medications.   **Note: The testing scope of this panel includes these medications:   Hydrocodone (Norco)   **Note: The testing scope of this panel does not include the  following reported medications:   Acetaminophen (Norco)  Albuterol (Ventolin HFA)  Albuterol (Duoneb)  Aspirin  Baclofen (Lioresal)  Cholecalciferol  Digoxin (Lanoxin)  Fluticasone  Gabapentin (Neurontin)  Ipratropium (Duoneb)  Levothyroxine (Synthroid)  Potassium (Klor-Con)  Sertraline (Zoloft)  Torsemide (Demadex)  Umeclidinium  Vilanterol ==================================================================== For clinical consultation, please call [phone]. ====================================================================  ROS  Constitutional: Denies any fever or chills Gastrointestinal: No reported hemesis, hematochezia, vomiting, or acute GI distress Musculoskeletal: Denies any acute onset joint swelling, redness, loss of ROM, or weakness Neurological: No reported episodes of acute onset apraxia, aphasia, dysarthria, agnosia, amnesia, paralysis, loss of coordination, or loss of consciousness  Medication Review  Cholecalciferol, Fluticasone-Umeclidin-Vilant, HYDROcodone-acetaminophen, albuterol, aspirin EC, baclofen, digoxin, gabapentin, ipratropium-albuterol, levothyroxine, potassium chloride SA, sertraline, and torsemide  History Review  Allergy: Theresa [name] is  allergic to codeine and meloxicam. Drug: Theresa [name]  reports no history of drug use. Alcohol:  reports no history of alcohol use. Tobacco:  reports that she has been smoking cigarettes. She has a 22.00 pack-year smoking history. She has never used smokeless tobacco. Social: Theresa [name]  reports that she has been smoking cigarettes. She has a 22.00 pack-year smoking history. She has never used smokeless tobacco. She reports that she does not drink alcohol and does not use drugs. Medical:  has a past medical history of Acute postoperative pain (10/03/2017), Anxiety, BiPAP (biphasic positive airway pressure) dependence, Carpal tunnel syndrome, CHF (congestive heart failure) ([place]), CHF (congestive heart failure) ([place]), Chronic generalized abdominal pain, Chronic rhinitis, COPD (chronic obstructive pulmonary disease) ([place]), DDD (degenerative disc disease), cervical, DDD (degenerative disc disease), lumbosacral, Depression, Dyspnea, Edema, Flu, Gastritis, GERD (gastroesophageal reflux disease), Hematuria, Hernia of abdominal wall (07/17/2018), Hernia, abdominal, Hypertension, Kidney stones, Low back pain, Lumbar radiculitis, Malodorous urine, Muscle weakness, Obesity, Oxygen dependent, Renal cyst, Sensory urge incontinence, Thyroid activity decreased, Tobacco abuse, and Wheezing. Surgical: Theresa [name]  has a past surgical history that includes Abdominal hysterectomy; Tonsillectomy; Carpal tunnel release (Left, 2012); Tubal ligation; epigastric hernia repair (N/A, 08/13/2018); Umbilical hernia repair (N/A, 08/13/2018); and RIGHT/LEFT HEART CATH AND CORONARY ANGIOGRAPHY (N/A, 07/11/2019). Family: family history includes Alcohol abuse in her father; Breast cancer in her sister; Cancer in her brother, brother, and sister; Coronary artery disease in her mother; Heart disease in her father and mother; Lung cancer in her sister; Pneumonia in her brother; Stroke in her mother.  Laboratory Chemistry Profile   Renal Lab  Results  Component Value Date   BUN 12 03/04/2020   CREATININE 0.75 03/04/2020   BCR 16 03/04/2020   GFRAA 99 03/04/2020   GFRNONAA 86 03/04/2020     Hepatic Lab Results  Component Value Date   AST 21 03/04/2020   ALT 16 03/04/2020   ALBUMIN 3.8 03/04/2020   ALKPHOS 149 (H) 03/04/2020   LIPASE 31 10/17/2017   AMMONIA 40 (H) 10/11/2019     Electrolytes Lab Results  Component Value Date   NA 139 03/04/2020   K 3.9 03/04/2020   CL 100 03/04/2020   CALCIUM 9.2 03/04/2020   MG 2.7 (H) 02/10/2020   PHOS 4.0 02/10/2020     Bone Lab Results  Component Value Date   25OHVITD1 13 (L) 09/17/2015   25OHVITD2 <1.0 09/17/2015   25OHVITD3 13 09/17/2015     Inflammation (CRP: Acute Phase) (ESR: Chronic Phase) Lab Results  Component Value Date   CRP 4.2 (H) 02/08/2020   ESRSEDRATE 16 08/08/2017   LATICACIDVEN 1.4 02/03/2020       Note: Above Lab results reviewed.  Recent Imaging Review  CT CHEST LUNG CANCER SCREENING LOW DOSE WO CONTRAST CLINICAL DATA:  Forty-five pack-year smoking history, current smoker.  EXAM: CT CHEST WITHOUT CONTRAST LOW-DOSE FOR LUNG CANCER SCREENING  TECHNIQUE: Multidetector CT imaging of the chest was performed following the standard protocol without IV contrast.  COMPARISON:  Chest radiograph 02/06/2020.  Diagnostic chest CT 07/07/2019. Screening CT 03/25/2019.  FINDINGS: Cardiovascular: Aortic atherosclerosis. Tortuous thoracic aorta. Mild cardiomegaly, without pericardial effusion. Multivessel coronary artery atherosclerosis.  Pulmonary artery enlargement, outflow tract 3.6 cm.  Mediastinum/Nodes: Multiple small middle mediastinal nodes, none pathologic by size criteria. Hilar regions poorly evaluated without intravenous contrast.  Lungs/Pleura: No pleural fluid. Moderate centrilobular emphysema. Lingular and less so right middle lobe scarring. Right-sided pulmonary nodules of maximally volume derived equivalent diameter 4.3 mm. No  dominant or significantly enlarged pulmonary nodule.  Upper Abdomen: Low-density bilateral liver lesions are similar, likely cysts. Normal imaged portions of the spleen, stomach, pancreas, adrenal glands, kidneys.  Musculoskeletal: No acute osseous abnormality.  IMPRESSION: 1. Lung-RADS 2, benign appearance or behavior. Continue annual screening with low-dose chest CT without contrast in 12 months. 2. Pulmonary artery enlargement suggests pulmonary arterial hypertension. 3. Aortic Atherosclerosis (ICD10-I70.0) and Emphysema (ICD10-J43.9). 4. Age advanced coronary artery atherosclerosis. Recommend assessment of coronary risk factors and consideration of medical therapy.  Electronically Signed   By: Abigail Miyamoto M.D.   On: 04/03/2020 11:41 Note: Reviewed        Physical Exam  General appearance: Well nourished, well developed, and well hydrated. In no apparent acute distress Mental status: Alert, oriented x 3 (person, place, & time)       Respiratory: No evidence of acute respiratory distress Eyes: PERLA Vitals: BP 111/67    Pulse 80    Resp 18    Ht _0  (1.575 m)    Wt 197 lb (89.4 kg)    SpO2 95%    BMI 36.03 kg/m  BMI: Estimated body mass index is 36.03 kg/m as calculated from the following:   Height as of this encounter: _1  (1.575 m).   Weight as of this encounter: 197 lb (89.4 kg). Ideal: Ideal body weight: 50.1 kg (110 lb 7.2 oz) Adjusted ideal body weight: 65.8 kg (145 lb 1.1 oz)  Assessment   Status Diagnosis  Controlled Controlled Controlled 1. Chronic pain syndrome (significant psychosocial component)   2. Chronic low back pain (Primary Area of Pain) (Bilateral) (L>R)   3. Chronic lower extremity pain (Secondary Area of Pain) (Left)   4. Chronic hip pain (Tertiary Area of Pain) (Bilateral) (L>R)   5. Pharmacologic therapy   6. Left thigh pain   7. Neurogenic pain   8. Neuropathic pain      Updated Problems: No problems updated.  Plan of Care   Problem-specific:  No problem-specific Assessment & Plan notes found for this encounter.  Theresa [name] has a current medication list which includes the following long-term medication(s): albuterol, digoxin, ipratropium-albuterol, levothyroxine, potassium chloride sa, sertraline, torsemide, [START ON 07/11/2020] baclofen, [START ON 07/11/2020] gabapentin, [START ON 05/08/2020] hydrocodone-acetaminophen, [START ON 06/07/2020] hydrocodone-acetaminophen, and [START ON 07/07/2020] hydrocodone-acetaminophen.  Pharmacotherapy (Medications Ordered): Meds ordered this encounter  Medications   HYDROcodone-acetaminophen (NORCO/VICODIN) 5-325 MG tablet    Sig: Take 1 tablet by mouth every 8 (eight) hours as needed for severe pain. Must last 30 days    Dispense:  90 tablet    Refill:  0    Chronic Pain: STOP Act (Not applicable) Fill 1 day early if closed on refill date. Do not fill until: 05/08/2020. To last until: 06/07/2020. Avoid benzodiazepines within 8 hours of opioids   HYDROcodone-acetaminophen (NORCO/VICODIN) 5-325 MG tablet    Sig: Take 1 tablet by mouth every 8 (eight) hours as needed for severe pain. Must last 30 days    Dispense:  90 tablet  Refill:  0    Chronic Pain: STOP Act (Not applicable) Fill 1 day early if closed on refill date. Do not fill until: 06/07/2020. To last until: 07/07/2020. Avoid benzodiazepines within 8 hours of opioids   HYDROcodone-acetaminophen (NORCO/VICODIN) 5-325 MG tablet    Sig: Take 1 tablet by mouth every 8 (eight) hours as needed for severe pain. Must last 30 days    Dispense:  90 tablet    Refill:  0    Chronic Pain: STOP Act (Not applicable) Fill 1 day early if closed on refill date. Do not fill until: 07/07/2020. To last until: 08/06/2020. Avoid benzodiazepines within 8 hours of opioids   baclofen (LIORESAL) 10 MG tablet    Sig: Take 1 tablet (10 mg total) by mouth 3 (three) times daily.    Dispense:  90 tablet    Refill:  5    Fill one day early if  pharmacy is closed on scheduled refill date. May substitute for generic if available.   gabapentin (NEURONTIN) 400 MG capsule    Sig: Take 1 capsule (400 mg total) by mouth 3 (three) times daily.    Dispense:  90 capsule    Refill:  5    Fill one day early if pharmacy is closed on scheduled refill date. May substitute for generic if available.   Orders:  No orders of the defined types were placed in this encounter.  Follow-up plan:   Return in about 13 weeks (around 08/05/2020) for 20-min, F2F, MM (on eval day).      Interventional management options: Planned, scheduled, and/or pending: NOTE: NO further Lumbar RFAuntil BMI <35. Diagnostic left L2-3 LESI #1 today   Considering: Palliative procedures    Palliative PRN treatment(s): Palliativebilaterallumbar facetblock #4 Diagnostic/palliative left L4TFESI #2 Diagnostic/palliative left L4-5 LESI #3 Diagnostic/palliativerightL4 TFESI #2 Diagnostic/palliativerightL4-5 LESI #2 Palliative leftlumbar facet RFA#2(done on 06/27/2017) Palliative right lumbar facet RFA#2(done on 10/03/2017)    Recent Visits Date Type Provider Dept  04/08/20 Telemedicine Milinda Pointer, MD [facility]  Showing recent visits within past 90 days and meeting all other requirements Today's Visits Date Type Provider Dept  05/06/20 Office Visit Milinda Pointer, MD [facility]  Showing today's visits and meeting all other requirements Future Appointments Date Type Provider Dept  08/03/20 Appointment Milinda Pointer, MD [facility]  Showing future appointments within next 90 days and meeting all other requirements  I discussed the assessment and treatment plan with the patient. The patient was provided an opportunity to ask questions and all were answered. The patient agreed with the plan and demonstrated an understanding of the instructions.  Patient advised to call back or seek an in-person  evaluation if the symptoms or condition worsens.  Duration of encounter: 30 minutes.  Note by: Gaspar Cola, MD Date: 05/06/2020; Time: 4:08 PM

## 2020-05-06 ENCOUNTER — Encounter: Payer: Self-pay | Admitting: Pain Medicine

## 2020-05-06 ENCOUNTER — Other Ambulatory Visit: Payer: Self-pay

## 2020-05-06 ENCOUNTER — Ambulatory Visit: Payer: Medicare HMO | Attending: Pain Medicine | Admitting: Pain Medicine

## 2020-05-06 VITALS — BP 111/67 | HR 80 | Resp 18 | Ht 62.0 in | Wt 197.0 lb

## 2020-05-06 DIAGNOSIS — M79605 Pain in left leg: Secondary | ICD-10-CM | POA: Diagnosis not present

## 2020-05-06 DIAGNOSIS — M25551 Pain in right hip: Secondary | ICD-10-CM | POA: Insufficient documentation

## 2020-05-06 DIAGNOSIS — M792 Neuralgia and neuritis, unspecified: Secondary | ICD-10-CM

## 2020-05-06 DIAGNOSIS — M545 Low back pain: Secondary | ICD-10-CM | POA: Diagnosis not present

## 2020-05-06 DIAGNOSIS — Z79899 Other long term (current) drug therapy: Secondary | ICD-10-CM | POA: Diagnosis not present

## 2020-05-06 DIAGNOSIS — G8929 Other chronic pain: Secondary | ICD-10-CM | POA: Diagnosis not present

## 2020-05-06 DIAGNOSIS — M25552 Pain in left hip: Secondary | ICD-10-CM | POA: Diagnosis not present

## 2020-05-06 DIAGNOSIS — G894 Chronic pain syndrome: Secondary | ICD-10-CM | POA: Diagnosis not present

## 2020-05-06 DIAGNOSIS — M79652 Pain in left thigh: Secondary | ICD-10-CM

## 2020-05-06 MED ORDER — HYDROCODONE-ACETAMINOPHEN 5-325 MG PO TABS: 1.0000 | ORAL_TABLET | Freq: Three times a day (TID) | ORAL | 0 refills | Status: DC | PRN

## 2020-05-06 MED ORDER — BACLOFEN 10 MG PO TABS: 10.0000 mg | ORAL_TABLET | Freq: Three times a day (TID) | ORAL | 5 refills | Status: DC

## 2020-05-06 MED ORDER — GABAPENTIN 400 MG PO CAPS: 400.0000 mg | ORAL_CAPSULE | Freq: Three times a day (TID) | ORAL | 5 refills | Status: DC

## 2020-05-06 NOTE — Patient Instructions (Signed)
____________________________________________________________________________________________  Drug Holidays (Slow)  What is a "Drug Holiday"? Drug Holiday: is the name given to the period of time during which a patient stops taking a medication(s) for the purpose of eliminating tolerance to the drug.  Benefits . Improved effectiveness of opioids. . Decreased opioid dose needed to achieve benefits. . Improved pain with lesser dose.  What is tolerance? Tolerance: is the progressive decreased in effectiveness of a drug due to its repetitive use. With repetitive use, the body gets use to the medication and as a consequence, it loses its effectiveness. This is a common problem seen with opioid pain medications. As a result, a larger dose of the drug is needed to achieve the same effect that used to be obtained with a smaller dose.  How long should a "Drug Holiday" last? You should stay off of the pain medicine for at least 14 consecutive days. (2 weeks)  Should I stop the medicine "cold [place]"? No. You should always coordinate with your Pain Specialist so that he/she can provide you with the correct medication dose to make the transition as smoothly as possible.  How do I stop the medicine? Slowly. You will be instructed to decrease the daily amount of pills that you take by one (1) pill every seven (7) days. This is called a "slow downward taper" of your dose. For example: if you normally take four (4) pills per day, you will be asked to drop this dose to three (3) pills per day for seven (7) days, then to two (2) pills per day for seven (7) days, then to one (1) per day for seven (7) days, and at the end of those last seven (7) days, this is when the "Drug Holiday" would start.   Will I have withdrawals? By doing a "slow downward taper" like this one, it is unlikely that you will experience any significant withdrawal symptoms. Typically, what triggers withdrawals is the sudden stop of a high  dose opioid therapy. Withdrawals can usually be avoided by slowly decreasing the dose over a prolonged period of time. If you do not follow these instructions and decide to stop your medication abruptly, withdrawals may be possible.  What are withdrawals? Withdrawals: refers to the wide range of symptoms that occur after stopping or dramatically reducing opiate drugs after heavy and prolonged use. Withdrawal symptoms do not occur to patients that use low dose opioids, or those who take the medication sporadically. Contrary to benzodiazepine (example: Valium, Xanax, etc.) or alcohol withdrawals ("Delirium Tremens"), opioid withdrawals are not lethal. Withdrawals are the physical manifestation of the body getting rid of the excess receptors.  Expected Symptoms Early symptoms of withdrawal may include: . Agitation . Anxiety . Muscle aches . Increased tearing . Insomnia . Runny nose . Sweating . Yawning  Late symptoms of withdrawal may include: . Abdominal cramping . Diarrhea . Dilated pupils . Goose bumps . Nausea . Vomiting  Will I experience withdrawals? Due to the slow nature of the taper, it is very unlikely that you will experience any.  What is a slow taper? Taper: refers to the gradual decrease in dose.  (Last update: 05/06/2020) ____________________________________________________________________________________________    ____________________________________________________________________________________________  Medication Rules  Purpose: To inform patients, and their family members, of our rules and regulations.  Applies to: All patients receiving prescriptions (written or electronic).  Pharmacy of record: Pharmacy where electronic prescriptions will be sent. If written prescriptions are taken to a different pharmacy, please inform the nursing staff. The pharmacy  listed in the electronic medical record should be the one where you would like electronic prescriptions  to be sent.  Electronic prescriptions: In compliance with the [place] (STOP) Act of 2017 (Session Lanny Cramp [phone]), effective October 17, 2018, all controlled substances must be electronically prescribed. Calling prescriptions to the pharmacy will cease to exist.  Prescription refills: Only during scheduled appointments. Applies to all prescriptions.  NOTE: The following applies primarily to controlled substances (Opioid* Pain Medications).   Type of encounter (visit): For patients receiving controlled substances, face-to-face visits are required. (Not an option or up to the patient.)  Patient's responsibilities: 1. Pain Pills: Bring all pain pills to every appointment (except for procedure appointments). 2. Pill Bottles: Bring pills in original pharmacy bottle. Always bring the newest bottle. Bring bottle, even if empty. 3. Medication refills: You are responsible for knowing and keeping track of what medications you take and those you need refilled. The day before your appointment: write a list of all prescriptions that need to be refilled. The day of the appointment: give the list to the admitting nurse. Prescriptions will be written only during appointments. No prescriptions will be written on procedure days. If you forget a medication: it will not be "Called in", "Faxed", or "electronically sent". You will need to get another appointment to get these prescribed. No early refills. Do not call asking to have your prescription filled early. 4. Prescription Accuracy: You are responsible for carefully inspecting your prescriptions before leaving our office. Have the discharge nurse carefully go over each prescription with you, before taking them home. Make sure that your name is accurately spelled, that your address is correct. Check the name and dose of your medication to make sure it is accurate. Check the number of pills, and the written instructions to  make sure they are clear and accurate. Make sure that you are given enough medication to last until your next medication refill appointment. 5. Taking Medication: Take medication as prescribed. When it comes to controlled substances, taking less pills or less frequently than prescribed is permitted and encouraged. Never take more pills than instructed. Never take medication more frequently than prescribed.  6. Inform other Doctors: Always inform, all of your healthcare providers, of all the medications you take. 7. Pain Medication from other Providers: You are not allowed to accept any additional pain medication from any other Doctor or Healthcare provider. There are two exceptions to this rule. (see below) In the event that you require additional pain medication, you are responsible for notifying us, as stated below. 8. Medication Agreement: You are responsible for carefully reading and following our Medication Agreement. This must be signed before receiving any prescriptions from our practice. Safely store a copy of your signed Agreement. Violations to the Agreement will result in no further prescriptions. (Additional copies of our Medication Agreement are available upon request.) 9. Laws, Rules, & Regulations: All patients are expected to follow all Federal and [employer], Rules, [employer] [facility]. Ignorance of the Laws does not constitute a valid excuse.  10. Illegal drugs and Controlled Substances: The use of illegal substances (including, but not limited to marijuana and its derivatives) and/or the illegal use of any controlled substances is strictly prohibited. Violation of this rule may result in the immediate and permanent discontinuation of any and all prescriptions being written by our practice. The use of any illegal substances is prohibited. 11. Adopted CDC guidelines & recommendations: Target dosing levels will be at or  below 60 MME/day. Use of benzodiazepines** is not  recommended.  Exceptions: There are only two exceptions to the rule of not receiving pain medications from other Healthcare Providers. 1. Exception #1 (Emergencies): In the event of an emergency (i.e.: accident requiring emergency care), you are allowed to receive additional pain medication. However, you are responsible for: As soon as you are able, call our office [phone], at any time of the day or night, and leave a message stating your name, the date and nature of the emergency, and the name and dose of the medication prescribed. In the event that your call is answered by a member of our staff, make sure to document and save the date, time, and the name of the person that took your information.  2. Exception #2 (Planned Surgery): In the event that you are scheduled by another doctor or dentist to have any type of surgery or procedure, you are allowed (for a period no longer than 30 days), to receive additional pain medication, for the acute post-op pain. However, in this case, you are responsible for picking up a copy of our "Post-op Pain Management for Surgeons" handout, and giving it to your surgeon or dentist. This document is available at our office, and does not require an appointment to obtain it. Simply go to our office during business hours (Monday-Thursday from 8:00 AM to 4:00 PM) (Friday 8:00 AM to 12:00 Noon) or if you have a scheduled appointment with [place], prior to your surgery, and ask for it by name. In addition, you will need to provide [place] with your name, name of your surgeon, type of surgery, and date of procedure or surgery.  *Opioid medications include: morphine, codeine, oxycodone, oxymorphone, hydrocodone, hydromorphone, meperidine, tramadol, tapentadol, buprenorphine, fentanyl, methadone. **Benzodiazepine medications include: diazepam (Valium), alprazolam (Xanax), clonazepam (Klonopine), lorazepam (Ativan), clorazepate (Tranxene), chlordiazepoxide (Librium), estazolam (Prosom),  oxazepam (Serax), temazepam (Restoril), triazolam (Halcion) (Last updated: 12/14/2017) ____________________________________________________________________________________________   ____________________________________________________________________________________________  Medication Recommendations and Reminders  Applies to: All patients receiving prescriptions (written and/or electronic).  Medication Rules & Regulations: These rules and regulations exist for your safety and that of others. They are not flexible and neither are we. Dismissing or ignoring them will be considered "non-compliance" with medication therapy, resulting in complete and irreversible termination of such therapy. (See document titled "Medication Rules" for more details.) In all conscience, because of safety reasons, we cannot continue providing a therapy where the patient does not follow instructions.  Pharmacy of record:   Definition: This is the pharmacy where your electronic prescriptions will be sent.   We do not endorse any particular pharmacy, however, we have experienced problems with [facility] not securing enough medication supply for the community.  We do not restrict you in your choice of pharmacy. However, once we write for your prescriptions, we will NOT be re-sending more prescriptions to fix restricted supply problems created by your pharmacy, or your insurance.   The pharmacy listed in the electronic medical record should be the one where you want electronic prescriptions to be sent.  If you choose to change pharmacy, simply notify our nursing staff.  Recommendations:  Keep all of your pain medications in a safe place, under lock and key, even if you live alone. We will NOT replace lost, stolen, or damaged medication.  After you fill your prescription, take 1 week's worth of pills and put them away in a safe place. You should keep a separate, properly labeled bottle for this purpose. The remainder  should be kept in the original bottle. Use this as your primary supply, until it runs out. Once it's gone, then you know that you have 1 week's worth of medicine, and it is time to come in for a prescription refill. If you do this correctly, it is unlikely that you will ever run out of medicine.  To make sure that the above recommendation works, it is very important that you make sure your medication refill appointments are scheduled at least 1 week before you run out of medicine. To do this in an effective manner, make sure that you do not leave the office without scheduling your next medication management appointment. Always ask the nursing staff to show you in your prescription , when your medication will be running out. Then arrange for the receptionist to get you a return appointment, at least 7 days before you run out of medicine. Do not wait until you have 1 or 2 pills left, to come in. This is very poor planning and does not take into consideration that we may need to cancel appointments due to bad weather, sickness, or emergencies affecting our staff.  DO NOT ACCEPT A "Partial Fill": If for any reason your pharmacy does not have enough pills/tablets to completely fill or refill your prescription, do not allow for a "partial fill". The law allows the pharmacy to complete that prescription within 72 hours, without requiring a new prescription. If they do not fill the rest of your prescription within those 72 hours, you will need a separate prescription to fill the remaining amount, which we will NOT provide. If the reason for the partial fill is your insurance, you will need to talk to the pharmacist about payment alternatives for the remaining tablets, but again, DO NOT ACCEPT A PARTIAL FILL, unless you can trust your pharmacist to obtain the remainder of the pills within 72 hours.  Prescription refills and/or changes in medication(s):   Prescription refills, and/or changes in dose or medication,  will be conducted only during scheduled medication management appointments. (Applies to both, written and electronic prescriptions.)  No refills on procedure days. No medication will be changed or started on procedure days. No changes, adjustments, and/or refills will be conducted on a procedure day. Doing so will interfere with the diagnostic portion of the procedure.  No phone refills. No medications will be "called into the pharmacy".  No Fax refills.  No weekend refills.  No Holliday refills.  No after hours refills.  Remember:  Business hours are:  Monday to Thursday 8:00 AM to 4:00 PM Provider's Schedule: Milinda Pointer, MD - Appointments are:  Medication management: Monday and Wednesday 8:00 AM to 4:00 PM Procedure day: Tuesday and Thursday 7:30 AM to 4:00 PM Gillis Santa, MD - Appointments are:  Medication management: Tuesday and Thursday 8:00 AM to 4:00 PM Procedure day: Monday and Wednesday 7:30 AM to 4:00 PM (Last update: 05/06/2020) ____________________________________________________________________________________________   ____________________________________________________________________________________________  CANNABIDIOL (AKA: CBD Oil or Pills)  Applies to: All patients receiving prescriptions of controlled substances (written and/or electronic).  General Information: Cannabidiol (CBD), a derivative of Marijuana, was discovered in 10. It is one of some 113 identified cannabinoids in cannabis (Marijuana) plants, accounting for up to 40% of the plant's extract. As of 2018, preliminary clinical research on cannabidiol included studies of anxiety, cognition, movement disorders, and pain.  Cannabidiol is consummed in multiple ways, including inhalation of cannabis smoke or vapor, as an aerosol spray into the cheek, and by mouth. It  may be supplied as CBD oil containing CBD as the active ingredient (no added tetrahydrocannabinol (THC) or terpenes), a full-plant  CBD-dominant hemp extract oil, capsules, dried cannabis, or as a liquid solution. CBD is thought not have the same psychoactivity as THC, and may affect the actions of THC. Studies suggest that CBD may interact with different biological targets, including cannabinoid receptors and other neurotransmitter receptors. As of 2018 the mechanism of action for its biological effects has not been determined.  In the [place], cannabidiol has a limited approval by the Food and Drug Administration (FDA) for treatment of only two types of epilepsy disorders. The side effects of long-term use of the drug include somnolence, decreased appetite, diarrhea, fatigue, malaise, weakness, sleeping problems, and others.  CBD remains a Schedule I drug prohibited for any use.  Legality: Some manufacturers ship CBD products nationally, an illegal action which the FDA has not enforced in 2018, with CBD remaining the subject of an FDA investigational new drug evaluation, and is not considered legal as a dietary supplement or food ingredient as of December 2018. Federal illegality has made it difficult historically to conduct research on CBD. CBD is openly sold in head shops and health food stores in some states where such sales have not been explicitly legalized.  Warning: Because it is not FDA approved for general use or treatment of pain, it is not required to undergo the same manufacturing controls as prescription drugs.  This means that the available cannabidiol (CBD) may be contaminated with THC.  If this is the case, it will trigger a positive urine drug screen (UDS) test for cannabinoids (Marijuana).  Because a positive UDS for illicit substances is a violation of our medication agreement, your opioid analgesics (pain medicine) may be permanently discontinued. (Last update: 05/06/2020) ____________________________________________________________________________________________

## 2020-05-06 NOTE — Progress Notes (Signed)
Nursing Pain Medication Assessment:  Safety precautions to be maintained throughout the outpatient stay will include: orient to surroundings, keep bed in low position, maintain call bell within reach at all times, provide assistance with transfer out of bed and ambulation.  Medication Inspection Compliance: Theresa [name] did not comply with our request to bring her pills to be counted. She was reminded that bringing the medication bottles, even when empty, is a requirement.  Medication: Hydrocodone/APAP Pill/Patch Count: No pills available to be counted. Pill/Patch Appearance: did not bring pills to count Bottle Appearance: No container available. Did not bring bottle(s) to appointment. Filled Date: . / . / 2021 Last Medication intake:  Today

## 2020-05-08 DIAGNOSIS — J449 Chronic obstructive pulmonary disease, unspecified: Secondary | ICD-10-CM | POA: Diagnosis not present

## 2020-05-21 ENCOUNTER — Encounter: Payer: Self-pay | Admitting: Family

## 2020-05-21 ENCOUNTER — Ambulatory Visit: Payer: Medicare HMO | Attending: Family | Admitting: Family

## 2020-05-21 ENCOUNTER — Other Ambulatory Visit: Payer: Self-pay

## 2020-05-21 VITALS — BP 111/67 | HR 81 | Resp 18 | Ht 62.0 in | Wt 197.0 lb

## 2020-05-21 DIAGNOSIS — Z8719 Personal history of other diseases of the digestive system: Secondary | ICD-10-CM | POA: Diagnosis not present

## 2020-05-21 DIAGNOSIS — G8929 Other chronic pain: Secondary | ICD-10-CM | POA: Diagnosis not present

## 2020-05-21 DIAGNOSIS — Z9989 Dependence on other enabling machines and devices: Secondary | ICD-10-CM | POA: Insufficient documentation

## 2020-05-21 DIAGNOSIS — Z6836 Body mass index (BMI) 36.0-36.9, adult: Secondary | ICD-10-CM | POA: Insufficient documentation

## 2020-05-21 DIAGNOSIS — M549 Dorsalgia, unspecified: Secondary | ICD-10-CM | POA: Insufficient documentation

## 2020-05-21 DIAGNOSIS — E669 Obesity, unspecified: Secondary | ICD-10-CM | POA: Insufficient documentation

## 2020-05-21 DIAGNOSIS — F17210 Nicotine dependence, cigarettes, uncomplicated: Secondary | ICD-10-CM | POA: Insufficient documentation

## 2020-05-21 DIAGNOSIS — Z9079 Acquired absence of other genital organ(s): Secondary | ICD-10-CM | POA: Diagnosis not present

## 2020-05-21 DIAGNOSIS — F329 Major depressive disorder, single episode, unspecified: Secondary | ICD-10-CM | POA: Diagnosis not present

## 2020-05-21 DIAGNOSIS — Z885 Allergy status to narcotic agent status: Secondary | ICD-10-CM | POA: Diagnosis not present

## 2020-05-21 DIAGNOSIS — J410 Simple chronic bronchitis: Secondary | ICD-10-CM

## 2020-05-21 DIAGNOSIS — Z720 Tobacco use: Secondary | ICD-10-CM

## 2020-05-21 DIAGNOSIS — Z9981 Dependence on supplemental oxygen: Secondary | ICD-10-CM | POA: Insufficient documentation

## 2020-05-21 DIAGNOSIS — E039 Hypothyroidism, unspecified: Secondary | ICD-10-CM | POA: Insufficient documentation

## 2020-05-21 DIAGNOSIS — Z7982 Long term (current) use of aspirin: Secondary | ICD-10-CM | POA: Insufficient documentation

## 2020-05-21 DIAGNOSIS — I10 Essential (primary) hypertension: Secondary | ICD-10-CM

## 2020-05-21 DIAGNOSIS — I272 Pulmonary hypertension, unspecified: Secondary | ICD-10-CM | POA: Diagnosis not present

## 2020-05-21 DIAGNOSIS — I110 Hypertensive heart disease with heart failure: Secondary | ICD-10-CM | POA: Insufficient documentation

## 2020-05-21 DIAGNOSIS — Z888 Allergy status to other drugs, medicaments and biological substances status: Secondary | ICD-10-CM | POA: Diagnosis not present

## 2020-05-21 DIAGNOSIS — J449 Chronic obstructive pulmonary disease, unspecified: Secondary | ICD-10-CM | POA: Diagnosis not present

## 2020-05-21 DIAGNOSIS — F419 Anxiety disorder, unspecified: Secondary | ICD-10-CM | POA: Diagnosis not present

## 2020-05-21 DIAGNOSIS — Z79899 Other long term (current) drug therapy: Secondary | ICD-10-CM | POA: Insufficient documentation

## 2020-05-21 DIAGNOSIS — Z79890 Hormone replacement therapy (postmenopausal): Secondary | ICD-10-CM | POA: Diagnosis not present

## 2020-05-21 DIAGNOSIS — I5032 Chronic diastolic (congestive) heart failure: Secondary | ICD-10-CM | POA: Diagnosis not present

## 2020-05-21 MED ORDER — TORSEMIDE 20 MG PO TABS: 40.0000 mg | ORAL_TABLET | Freq: Two times a day (BID) | ORAL | 3 refills | Status: DC

## 2020-05-21 NOTE — Progress Notes (Signed)
Patient ID: Theresa [name], female    DOB: Jun 02, 1956, 64 y.o.   MRN: [id number]  HPI Ms Theresa [name] is a 64 y/o female with a history of chronic low back pain, HTN, depression, COPD on long-term oxygen, anxiety, carpal tunnel syndrome, long-standing tobacco use and chronic heart failure.  Echo report from 07/07/2019 reviewed and showed an EF of 60-65% along with mild TR and moderately elevated PA pressure. Echo done 11/03/15 but unable to access results. Echo done on 02/19/15 showed an EF of 50% without valvular stenosis.   Catheterization done 07/11/2019 showed:   Prox RCA to Dist RCA lesion is 50% stenosed.  Mid LAD lesion is 50% stenosed.  Ost LM to Dist LM lesion is 10% stenosed.  Hemodynamic findings consistent with moderate pulmonary hypertension.  Normal overall left ventricular function of at least 60%  Moderate coronary disease no significant obstructive disease  Moderate pulmonary hypertension  Admitted 02/03/20 due to shortness of breath. Initially needed bipap due to hypoxia. Tested + for COVID. Weaned off bipap and on to her nasal cannula oxygen at 2.5L. Treated empirically for herpes zoster. Completed remdesivir 5-day course in house and ivermectin 6-day course in house. Will complete 10-day course of steroids. Discharged after 8 days.   She presents today for a follow-up visit although hasn't been seen since January 2019. She presents with a chief complaint of moderate shortness of breath upon minimal exertion. She says that her shortness of breath is worse during the summer when the weather is hot/ humid. She has associated fatigue, productive cough, anxiety, difficulty sleeping and chronic back pain along with this. She denies any dizziness, abdominal distention, palpitations, pedal edema, chest pain, wheezing or weight gain.   Has been following a keto diet and has lost ~ 50 pounds or more.   Past Medical History:  Diagnosis Date  . Acute postoperative pain 10/03/2017  .  Anxiety   . BiPAP (biphasic positive airway pressure) dependence    at hs  . Carpal tunnel syndrome   . CHF (congestive heart failure) ([place])    1/18  . CHF (congestive heart failure) ([place])   . Chronic generalized abdominal pain   . Chronic rhinitis   . COPD (chronic obstructive pulmonary disease) ([place])   . DDD (degenerative disc disease), cervical   . DDD (degenerative disc disease), lumbosacral   . Depression   . Dyspnea   . Edema   . Flu    1/18  . Gastritis   . GERD (gastroesophageal reflux disease)   . Hematuria   . Hernia of abdominal wall 07/17/2018  . Hernia, abdominal   . Hypertension   . Kidney stones   . Low back pain   . Lumbar radiculitis   . Malodorous urine   . Muscle weakness   . Obesity   . Oxygen dependent   . Renal cyst   . Sensory urge incontinence   . Thyroid activity decreased    9/19  . Tobacco abuse   . Wheezing    Past Surgical History:  Procedure Laterality Date  . ABDOMINAL HYSTERECTOMY    . CARPAL TUNNEL RELEASE Left 2012  . EPIGASTRIC HERNIA REPAIR N/A 08/13/2018   HERNIA REPAIR EPIGASTRIC ADULT; polypropylene mesh reinforcement.  Surgeon: Robert Bellow, MD;  Location: [facility] ORS;  Service: General;  Laterality: N/A;  . RIGHT/LEFT HEART CATH AND CORONARY ANGIOGRAPHY N/A 07/11/2019   Procedure: RIGHT/LEFT HEART CATH AND CORONARY ANGIOGRAPHY;  Surgeon: Yolonda Kida, MD;  Location: [place] [facility]  [facility];  Service: Cardiovascular;  Laterality: N/A;  . TONSILLECTOMY    . TUBAL LIGATION    . UMBILICAL HERNIA REPAIR N/A 08/13/2018   HERNIA REPAIR UMBILICAL ADULT; polypropylene mesh reinforcement Surgeon: Robert Bellow, MD;  Location: [facility] ORS;  Service: General;  Laterality: N/A;   Family History  Problem Relation Age of Onset  . Heart disease Mother   . Stroke Mother   . Coronary artery disease Mother   . Lung cancer Sister   . Cancer Sister   . Breast cancer Sister   . Alcohol abuse Father   . Heart disease Father   .  Cancer Brother   . Cancer Brother   . Pneumonia Brother   . Prostate cancer Neg Hx   . Kidney cancer Neg Hx   . Bladder Cancer Neg Hx    Social History   Tobacco Use  . Smoking status: Current Every Day Smoker    Packs/day: 0.50    Years: 44.00    Pack years: 22.00    Types: Cigarettes  . Smokeless tobacco: Never Used  . Tobacco comment: over a half pack  Substance Use Topics  . Alcohol use: No   Allergies  Allergen Reactions  . Codeine Nausea And Vomiting  . Meloxicam Other (See Comments)    Stomach pain   Prior to Admission medications   Medication Sig Start Date End Date Taking? Authorizing Provider  albuterol (VENTOLIN HFA) 108 (90 Base) MCG/ACT inhaler Inhale 2 puffs into the lungs every 6 (six) hours as needed for wheezing or shortness of breath. 10/23/19  Yes Alfred Levins, [place], MD  aspirin EC 81 MG tablet Take 81 mg by mouth daily.   Yes [provider]  baclofen (LIORESAL) 10 MG tablet Take 1 tablet (10 mg total) by mouth 3 (three) times daily. 07/11/20 01/07/21 Yes Milinda Pointer, MD  Cholecalciferol ([employer] D3) 2000 units CAPS Take 2,000 Units by mouth daily.    Yes [provider]  digoxin (LANOXIN) 0.25 MG tablet TAKE 1 TABLET(0.25 MG) BY MOUTH DAILY 02/22/20  Yes Chrismon, Vickki Muff, PA  Fluticasone-Umeclidin-Vilant 100-62.5-25 MCG/INH AEPB Inhale 1 puff into the lungs daily. 10/03/18  Yes [provider]  gabapentin (NEURONTIN) 400 MG capsule Take 1 capsule (400 mg total) by mouth 3 (three) times daily. 07/11/20 01/07/21 Yes Milinda Pointer, MD  HYDROcodone-acetaminophen (NORCO/VICODIN) 5-325 MG tablet Take 1 tablet by mouth every 8 (eight) hours as needed for severe pain. Must last 30 days 05/08/20 06/07/20 Yes Milinda Pointer, MD  HYDROcodone-acetaminophen (NORCO/VICODIN) 5-325 MG tablet Take 1 tablet by mouth every 8 (eight) hours as needed for severe pain. Must last 30 days 06/07/20 07/07/20 Yes Milinda Pointer, MD   HYDROcodone-acetaminophen (NORCO/VICODIN) 5-325 MG tablet Take 1 tablet by mouth every 8 (eight) hours as needed for severe pain. Must last 30 days 07/07/20 08/06/20 Yes Milinda Pointer, MD  ipratropium-albuterol (DUONEB) 0.5-2.5 (3) MG/3ML SOLN USE 1 VIAL VIA NEBULIZER EVERY 6 HOURS AS NEEDED FOR SHORTNESS OF BREATH OR WHEEZING 10/14/19  Yes Chrismon, Vickki Muff, PA  levothyroxine (SYNTHROID) 25 MCG tablet TAKE 1 TABLET(25 MCG) BY MOUTH DAILY BEFORE BREAKFAST 12/27/19  Yes Chrismon, Vickki Muff, PA  potassium chloride SA (KLOR-CON) 20 MEQ tablet TAKE 1 TABLET(20 MEQ) BY MOUTH TWICE DAILY 09/15/19  Yes Darylene Price A, FNP  sertraline (ZOLOFT) 100 MG tablet TAKE 2 TABLETS BY MOUTH EVERY MORNING 11/24/19  Yes Chrismon, Vickki Muff, PA  torsemide (DEMADEX) 20 MG tablet Take 2 tablets (40 mg total) by mouth  2 (two) times daily. 05/21/20  Yes Navi Erber A, FNP  Treprostinil (TYVASO) 0.6 MG/ML SOLN Inhale 18 mcg into the lungs 4 (four) times daily.   Yes [provider]     Review of Systems  Constitutional: Positive for fatigue. Negative for appetite change.  HENT: Negative for congestion, postnasal drip and sore throat.   Eyes: Negative.   Respiratory: Positive for cough and shortness of breath. Negative for chest tightness and wheezing.   Cardiovascular: Negative for chest pain, palpitations and leg swelling.  Gastrointestinal: Negative for abdominal distention, abdominal pain and nausea.  Endocrine: Negative.   Genitourinary: Negative.   Musculoskeletal: Positive for back pain. Negative for neck pain.  Skin: Negative.   Allergic/Immunologic: Negative.   Neurological: Negative for dizziness and light-headedness.  Hematological: Negative for adenopathy. Does not bruise/bleed easily.  Psychiatric/Behavioral: Positive for sleep disturbance (wearing oxygen & bipap at night). Negative for agitation, dysphoric mood and suicidal ideas. The patient is nervous/anxious.    Vitals:   05/21/20 1035   BP: 111/67  Pulse: 81  Resp: 18  SpO2: 91%  Weight: 197 lb (89.4 kg)  Height: _0  (1.575 m)   Wt Readings from Last 3 Encounters:  05/21/20 197 lb (89.4 kg)  05/06/20 197 lb (89.4 kg)  04/02/20 204 lb (92.5 kg)   Lab Results  Component Value Date   CREATININE 0.75 03/04/2020   CREATININE 0.68 02/10/2020   CREATININE 0.73 02/09/2020    Physical Exam Vitals and nursing note reviewed.  Constitutional:      Appearance: She is well-developed.  HENT:     Head: Normocephalic and atraumatic.  Neck:     Vascular: No JVD.  Cardiovascular:     Rate and Rhythm: Normal rate and regular rhythm.  Pulmonary:     Effort: Pulmonary effort is normal.     Breath sounds: Examination of the left-upper field reveals rhonchi. Rhonchi present. No wheezing or rales.  Abdominal:     General: There is no distension.     Palpations: Abdomen is soft.     Tenderness: There is no abdominal tenderness.  Musculoskeletal:        General: No tenderness.     Cervical back: Normal range of motion and neck supple.  Skin:    General: Skin is warm and dry.  Neurological:     Mental Status: She is alert and oriented to person, place, and time.  Psychiatric:        Mood and Affect: Mood normal. Mood is not depressed.        Behavior: Behavior normal.        Thought Content: Thought content normal.       Assessment & Plan:  1: Chronic heart failure with preserved ejection fraction without structural changes- - NYHA class III - euvolemic today - continues to weigh daily at home. Reminded to call for an overnight weight gain of >2 pounds or a weekly weight gain of >5 pounds - has lost ~ 50-60 pounds due to following a keto diet and reading food labels  - monitor sodium content carefully - saw cardiologist Dema Severin) 01/27/20  - BNP 02/03/20 was 190.0  2: HTN- - BP looks good today - saw PCP (Chrismon) 03/23/20 - BMP from 03/04/20 reviewed and showed sodium 139, potassium 3.9, creatinine 0.75 and GFR  86  3: COPD- - wearing bipap nightly - oxygen at 2.5L at bedtime and then as needed during the day - last saw pulmonology Raul Del) on 04/27/20 &  PFT's were done  4: Tobacco use- - alternates between the lozenges, gum and actual cigarettes - removes herself from the oxygen when smoking - complete cessation discussed for 3 minutes with her   Patient did not bring her medications nor a list. Each medication was verbally reviewed with the patient and she was encouraged to bring the bottles to every visit to confirm accuracy of list.  Return in 6 months or sooner for any questions/problems before then.

## 2020-05-21 NOTE — Patient Instructions (Signed)
Continue weighing daily and call for an overnight weight gain of > 2 pounds or a weekly weight gain of >5 pounds.

## 2020-05-22 DIAGNOSIS — J9610 Chronic respiratory failure, unspecified whether with hypoxia or hypercapnia: Secondary | ICD-10-CM | POA: Diagnosis not present

## 2020-05-28 ENCOUNTER — Other Ambulatory Visit: Payer: Self-pay | Admitting: Family Medicine

## 2020-05-28 DIAGNOSIS — I10 Essential (primary) hypertension: Secondary | ICD-10-CM | POA: Diagnosis not present

## 2020-05-28 DIAGNOSIS — R600 Localized edema: Secondary | ICD-10-CM | POA: Diagnosis not present

## 2020-05-28 DIAGNOSIS — J439 Emphysema, unspecified: Secondary | ICD-10-CM | POA: Diagnosis not present

## 2020-05-28 DIAGNOSIS — R0602 Shortness of breath: Secondary | ICD-10-CM | POA: Diagnosis not present

## 2020-05-28 DIAGNOSIS — E782 Mixed hyperlipidemia: Secondary | ICD-10-CM | POA: Diagnosis not present

## 2020-05-28 DIAGNOSIS — I2510 Atherosclerotic heart disease of native coronary artery without angina pectoris: Secondary | ICD-10-CM | POA: Diagnosis not present

## 2020-05-28 DIAGNOSIS — I272 Pulmonary hypertension, unspecified: Secondary | ICD-10-CM | POA: Diagnosis not present

## 2020-05-28 DIAGNOSIS — I5032 Chronic diastolic (congestive) heart failure: Secondary | ICD-10-CM | POA: Diagnosis not present

## 2020-05-28 DIAGNOSIS — I208 Other forms of angina pectoris: Secondary | ICD-10-CM | POA: Diagnosis not present

## 2020-06-03 IMAGING — DX DG HIP (WITH OR WITHOUT PELVIS) 2-3V*R*
3 series · 3 of 3 positions shown · non-contrast
Comparison: 12/29/2015

CLINICAL DATA: Right hip and inguinal pain

EXAM:
DG HIP (WITH OR WITHOUT PELVIS) 2-3V RIGHT

[pelvis ap]
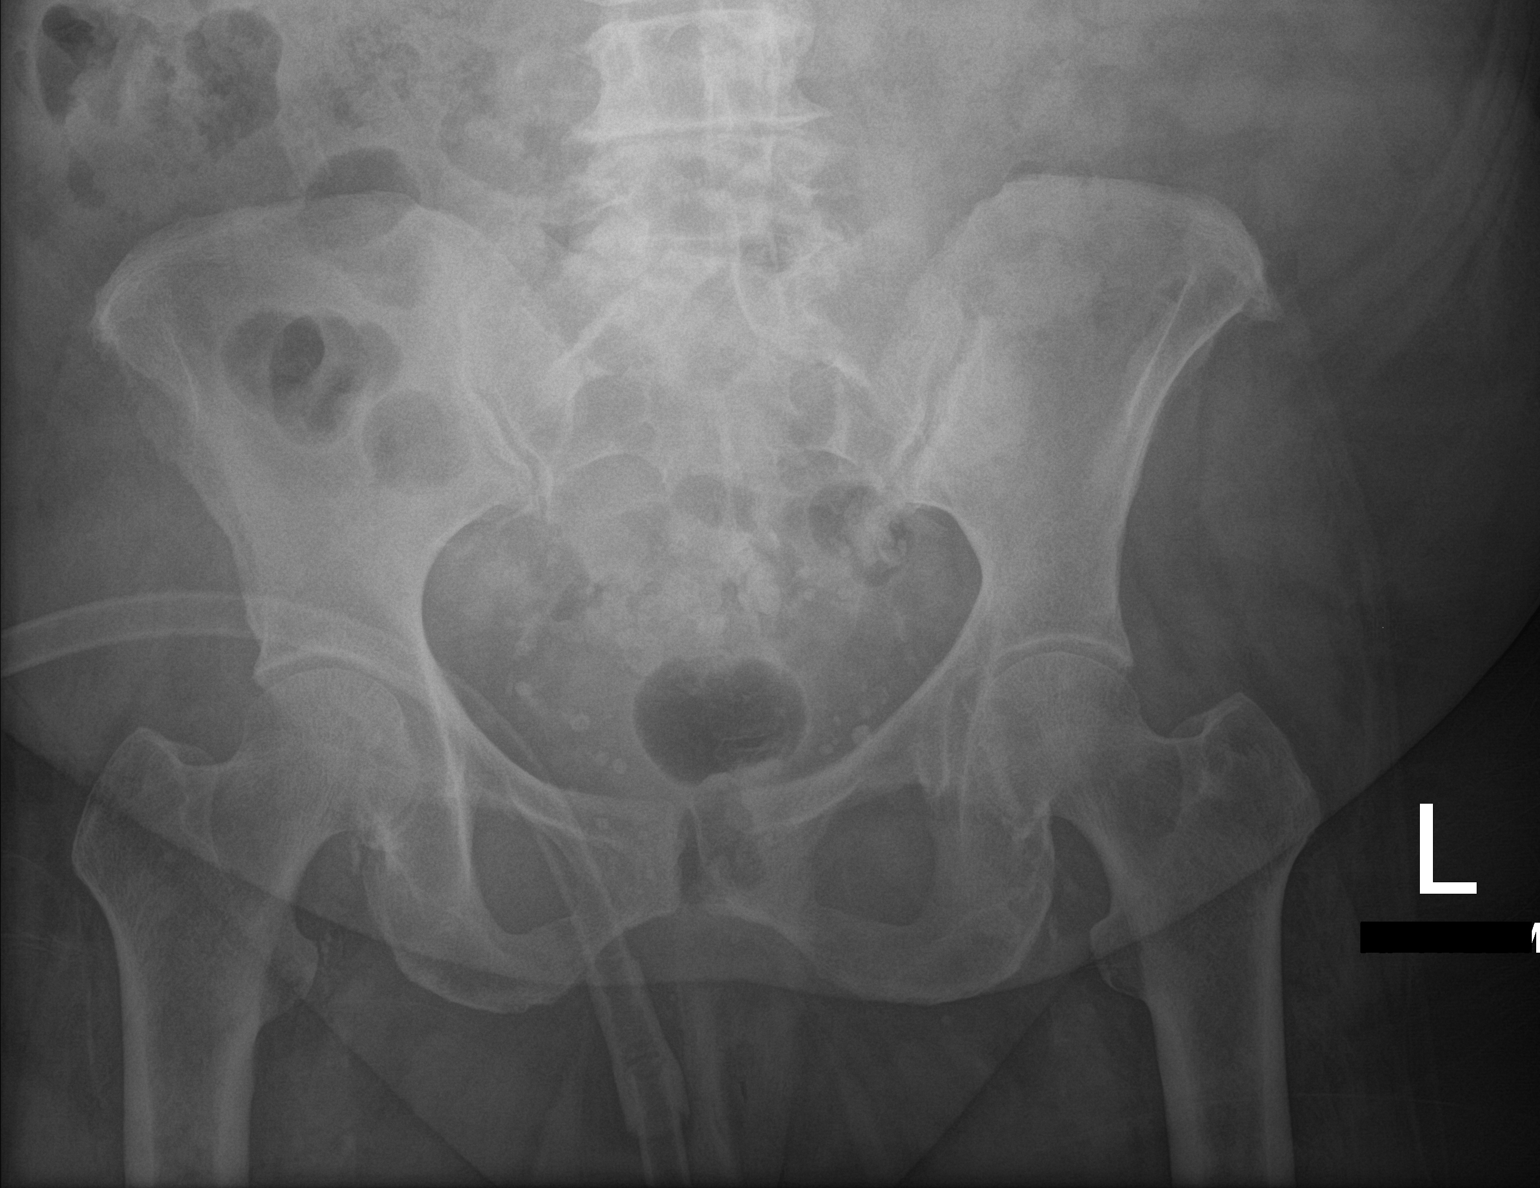

[hip ap]
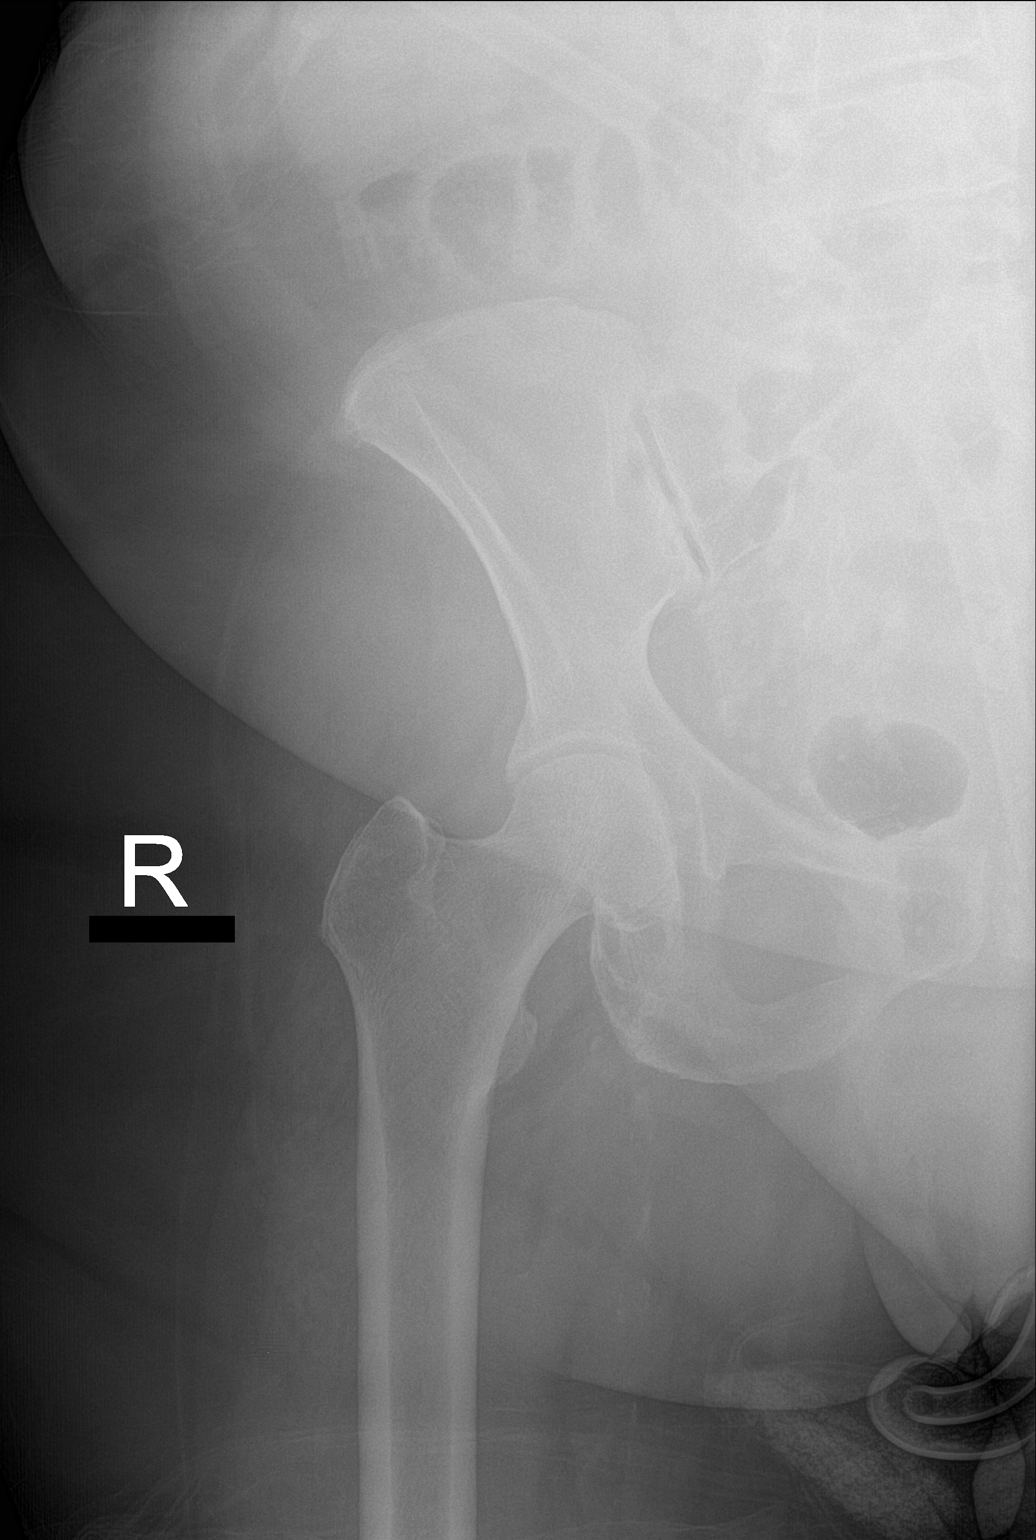

[hip lat]
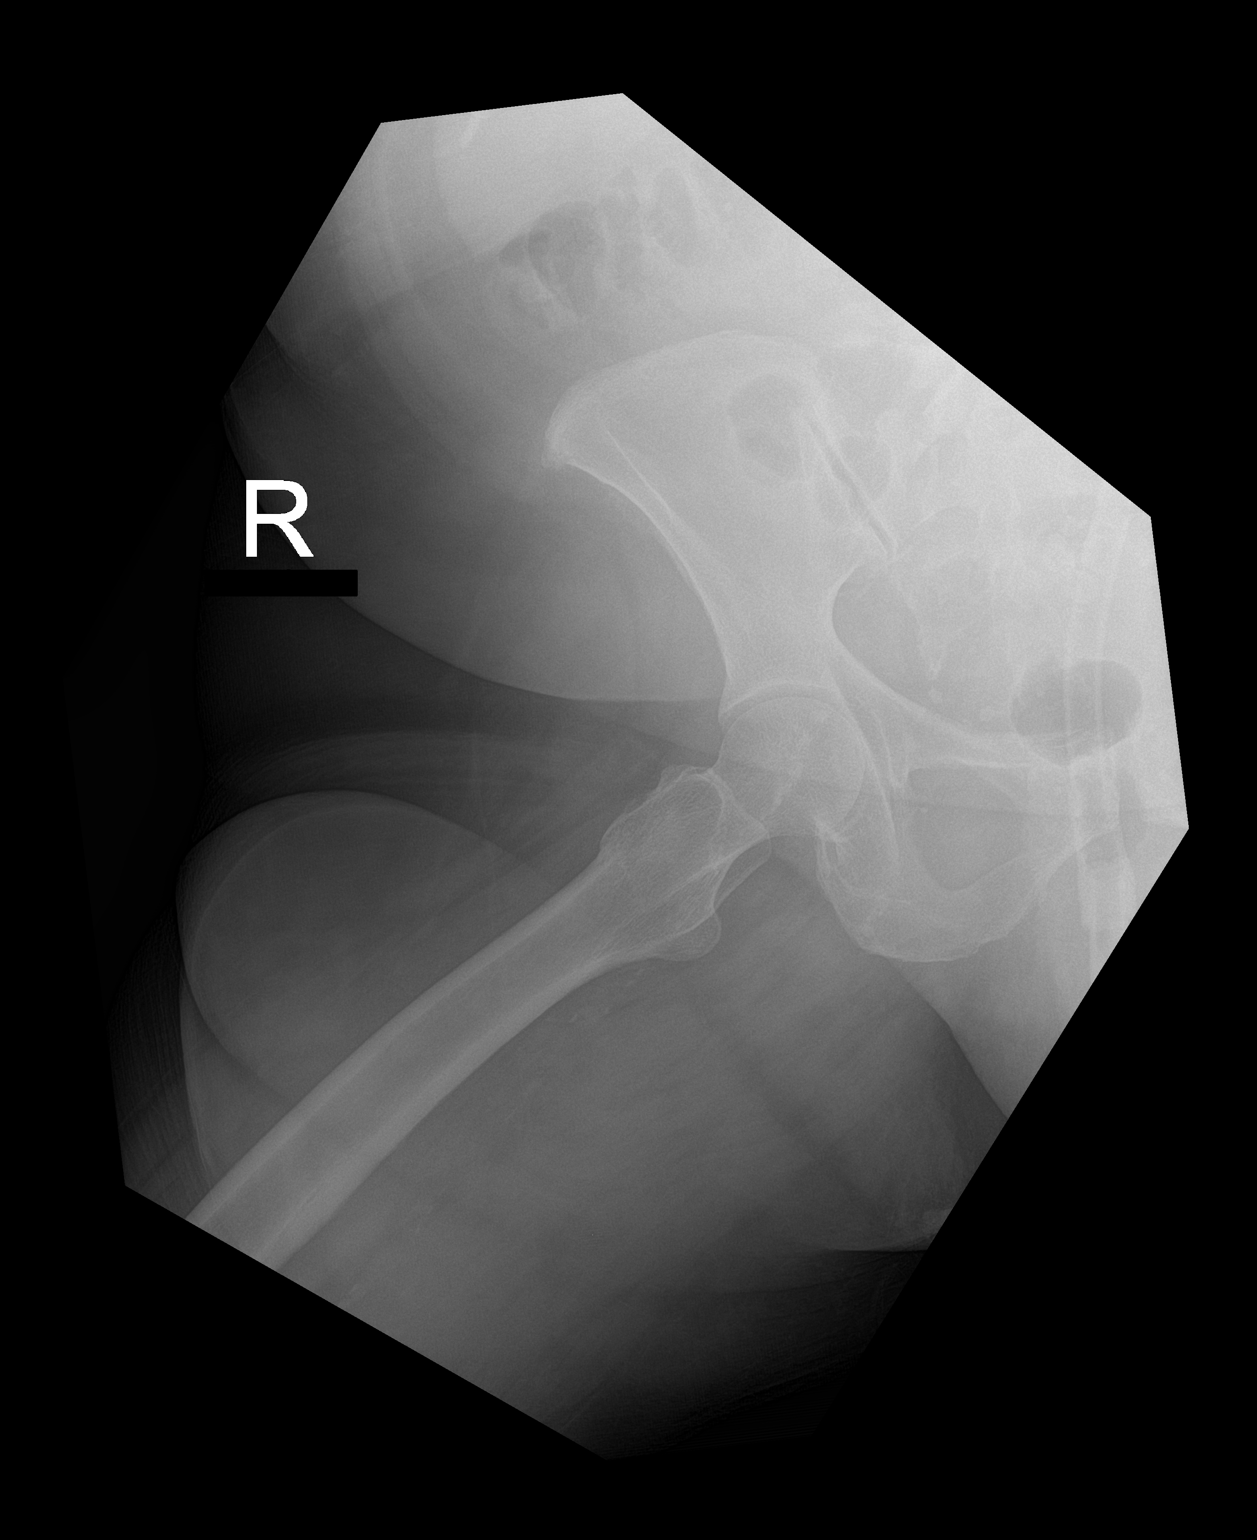

[3 of 3 positions shown; findings below may reference images not displayed]

FINDINGS: Frontal view of the pelvis as well as frontal and frogleg lateral
views of the right hip are obtained. No acute displaced fracture.
Alignment is anatomic. Joint spaces are relatively well preserved.

There is prominent spondylosis at the lumbosacral junction.
Asymmetric sclerosis of the left sacroiliac joint.
IMPRESSION: 1. No acute displaced fracture.
2. Prominent lower lumbar spondylosis.
3. Asymmetric left-sided sacroiliitis.

## 2020-06-08 DIAGNOSIS — J449 Chronic obstructive pulmonary disease, unspecified: Secondary | ICD-10-CM | POA: Diagnosis not present

## 2020-06-12 ENCOUNTER — Telehealth: Payer: Self-pay | Admitting: *Deleted

## 2020-06-12 NOTE — Chronic Care Management (AMB) (Signed)
  Chronic Care Management   Note  06/12/2020 Name: Theresa [name] MRN: [id number] DOB: 02/11/1956  Theresa [name] is a 64 y.o. year old female who is a primary care patient of Chrismon, Vickki Muff, [place]. I reached out to [name] by phone today in response to a referral sent by Ms. [facility] health plan.      Ms. Shingler was given information about Chronic Care Management services today including:  1. CCM service includes personalized support from designated clinical staff supervised by her physician, including individualized plan of care and coordination with other care providers 2. 24/7 contact phone numbers for assistance for urgent and routine care needs. 3. Service will only be billed when office clinical staff spend 20 minutes or more in a month to coordinate care. 4. Only one practitioner may furnish and bill the service in a calendar month. 5. The patient may stop CCM services at any time (effective at the end of the month) by phone call to the office staff. 6. The patient will be responsible for cost sharing (co-pay) of up to 20% of the service fee (after annual deductible is met).  Patient agreed to services and verbal consent obtained.   Follow up plan: Telephone appointment with care management team member scheduled for: 06/18/2020  [place], [facility]  [place], [place] [address] Direct Dial: [email or web address] Website: [email or web address]

## 2020-06-18 ENCOUNTER — Ambulatory Visit: Payer: Medicare HMO

## 2020-06-18 DIAGNOSIS — G894 Chronic pain syndrome: Secondary | ICD-10-CM

## 2020-06-18 DIAGNOSIS — J449 Chronic obstructive pulmonary disease, unspecified: Secondary | ICD-10-CM

## 2020-06-18 DIAGNOSIS — I5032 Chronic diastolic (congestive) heart failure: Secondary | ICD-10-CM

## 2020-06-18 DIAGNOSIS — R0902 Hypoxemia: Secondary | ICD-10-CM

## 2020-06-18 NOTE — Chronic Care Management (AMB) (Signed)
Chronic Care Management   Initial Visit Note  06/18/2020 Name: Theresa [name] MRN: [id number] DOB: November 09, 1955  Primary Care Provider: Margo Common, PA Reason for referral : Chronic Care Management   Theresa [name] is a 64 y.o. year old female who is a primary care patient of Theresa [name]. The CCM team was consulted for assistance with chronic disease management and care coordination. A telephonic assessment was conducted today.   Review of Theresa [name]'s status, including review of consultants reports, relevant labs and test results was conducted today. Collaboration with appropriate care team members was performed as part of the comprehensive evaluation and provision of chronic care management services.    SDOH (Social Determinants of Health) assessments performed: Yes SDOH Interventions     Most Recent Value  SDOH Interventions  SDOH Interventions for the Following Domains Tobacco  Food Insecurity Interventions Intervention Not Indicated  Tobacco Interventions Other (Comment)  [Reports having needed smoking cessation resources.]  Transportation Interventions Intervention Not Indicated        Medications: Outpatient Encounter Medications as of 06/18/2020  Medication Sig  . albuterol (VENTOLIN HFA) 108 (90 Base) MCG/ACT inhaler Inhale 2 puffs into the lungs every 6 (six) hours as needed for wheezing or shortness of breath.  Marland Kitchen aspirin EC 81 MG tablet Take 81 mg by mouth daily.  Derrill Memo ON 07/11/2020] baclofen (LIORESAL) 10 MG tablet Take 1 tablet (10 mg total) by mouth 3 (three) times daily.  . Cholecalciferol (CVS D3) 2000 units CAPS Take 2,000 Units by mouth daily.   . digoxin (LANOXIN) 0.25 MG tablet TAKE 1 TABLET(0.25 MG) BY MOUTH DAILY  . [START ON 07/11/2020] gabapentin (NEURONTIN) 400 MG capsule Take 1 capsule (400 mg total) by mouth 3 (three) times daily.  Marland Kitchen HYDROcodone-acetaminophen (NORCO/VICODIN) 5-325 MG tablet Take 1 tablet by mouth every 8 (eight) hours as  needed for severe pain. Must last 30 days  . ipratropium-albuterol (DUONEB) 0.5-2.5 (3) MG/3ML SOLN USE 1 VIAL VIA NEBULIZER EVERY 6 HOURS AS NEEDED FOR SHORTNESS OF BREATH OR WHEEZING  . levothyroxine (SYNTHROID) 25 MCG tablet TAKE 1 TABLET(25 MCG) BY MOUTH DAILY BEFORE BREAKFAST  . potassium chloride SA (KLOR-CON) 20 MEQ tablet TAKE 1 TABLET(20 MEQ) BY MOUTH TWICE DAILY  . sertraline (ZOLOFT) 100 MG tablet TAKE 2 TABLETS BY MOUTH EVERY MORNING  . torsemide (DEMADEX) 20 MG tablet Take 2 tablets (40 mg total) by mouth 2 (two) times daily.  . Treprostinil (TYVASO) 0.6 MG/ML SOLN Inhale 18 mcg into the lungs 4 (four) times daily.  . Fluticasone-Umeclidin-Vilant 100-62.5-25 MCG/INH AEPB Inhale 1 puff into the lungs daily.  Marland Kitchen HYDROcodone-acetaminophen (NORCO/VICODIN) 5-325 MG tablet Take 1 tablet by mouth every 8 (eight) hours as needed for severe pain. Must last 30 days  . [START ON 07/07/2020] HYDROcodone-acetaminophen (NORCO/VICODIN) 5-325 MG tablet Take 1 tablet by mouth every 8 (eight) hours as needed for severe pain. Must last 30 days   No facility-administered encounter medications on file as of 06/18/2020.     Objective:  BP Readings from Last 3 Encounters:  05/21/20 111/67  05/06/20 111/67  03/23/20 102/64   Lab Results  Component Value Date   HGBA1C 6.4 (H) 02/04/2020    Wt Readings from Last 3 Encounters:  05/21/20 197 lb (89.4 kg)  05/06/20 197 lb (89.4 kg)  04/02/20 204 lb (92.5 kg)    Goals Addressed            This Visit's Progress   . Chronic Disease Management  CARE PLAN ENTRY (see longitudinal plan of care for additional care plan information)  Current Barriers:  . Chronic Disease Management support and education needs related to CHF, COPD, PAH and Chronic Pain Syndrome.  Case Manager Clinical Goal(s):  Over the next 120 days, patient will: . Not require hospitalization d/t complications r/t chronic illnesses. . Continue to take all medications as  prescribed. . Continue to attend medical appointments as scheduled.  . Monitor daily weights and record readings.  . Monitor BP and inform Cardiology team of readings below established range. . Complete outreach with smoking cessation counselor.  Over the next 45 days, patient will: . Complete outreach with CCM Pharmacist to discuss prescription assistance (Trelegy)   Interventions:  . Inter-disciplinary care team collaboration (see longitudinal plan of care) . Reviewed medications and concerns regarding prescription cost. Reports self administering medications and tolerating prescribed regimen well. Currently receiving assistance with Tyvaso. Expressed concerns regarding cost of Trelegy. Agreeable to outreach with the CCM Pharmacist regarding prescription assistance. . Reviewed COPD treatment plan. Discussed current action plan, reviewed indications for notifying provider and discussed worsening s/sx that require immediate medical attention. No recent exacerbations. Has not required hospitalization or emergent care since April. Reports symptoms are well controlled with current inhalers. Currently using home O2 at 2.5L/min. Reports oxygen saturations from 95-96%. . Reviewed weight parameters and complications r/t CHF. Encouraged to monitor weight and BP daily and record readings. Reports current weight of 192 lbs and BP around 94/54. Discussed established BP parameters and indications for notifying the Cardiology team with low readings. She is aware of need to notify provider if weight increases greater than 3 lbs overnight or more than 5 lbs within a week. She does experience exertional dyspnea but otherwise reports doing well. No recent episodes of chest discomfort, palpitations or dizziness. No abdominal or lower extremity edema. Denies changes/decline in activity tolerance. . Discussed current tobacco use and plan for smoking cessation. Currently smokes a little less than a pack a day. Reports  trying nicotine patches, nicotine gum and Wellbutrin in the past. She is aware of the impact smoking has on her overall health and reports having several resources and information for smoking cessation counseling.  . Reviewed scheduled/upcoming provider appointments. Encouraged to attend scheduled medical appointments to prevent delays in care. Pending follow-up with Pain Management on 08/03/20. Pending outreach with Pulmonologist on 08/31/20. No concerns regarding transportation. . Discussed plan for ongoing care management and follow-up. Provided direct contact information. Reports doing well over the past few months. She is very motivated to achieve her weight loss goals and doing very well with a keto diet. Reports being independent in the home. Spouse is available to assist if needed. No urgent care management concerns.   Patient Self Care Activities:  . Self administers medications  . Attends scheduled provider appointments . Calls pharmacy for medication refills . Performs ADL's independently . Performs IADL's independently   Initial goal documentation        Ms. Folkes was given information about Chronic Care Management services including:  1. CCM service includes personalized support from designated clinical staff supervised by her physician, including individualized plan of care and coordination with other care providers 2. 24/7 contact phone numbers for assistance for urgent and routine care needs. 3. Service will only be billed when office clinical staff spend 20 minutes or more in a month to coordinate care. 4. Only one practitioner may furnish and bill the service in a calendar month. 5. The patient may  stop CCM services at any time (effective at the end of the month) by phone call to the office staff. 6. The patient will be responsible for cost sharing (co-pay) of up to 20% of the service fee (after annual deductible is met).  Patient agreed to services and verbal consent  obtained.     PLAN -Will place referral to CCM Pharmacist for prescription assistance. -Will follow-up with Mrs. Sievers in November.    Cristy [facility] Care Management [facility] [phone]

## 2020-06-18 NOTE — Patient Instructions (Addendum)
Thank you for allowing the Chronic Care Management team to participate in your care.   Goals Addressed            This Visit's Progress   . Chronic Disease Management       CARE PLAN ENTRY (see longitudinal plan of care for additional care plan information)  Current Barriers:  . Chronic Disease Management support and education needs related to CHF, COPD, PAH and Chronic Pain Syndrome.  Case Manager Clinical Goal(s):  Over the next 120 days, patient will: . Not require hospitalization d/t complications r/t chronic illnesses. . Continue to take all medications as prescribed. . Continue to attend medical appointments as scheduled.  . Monitor daily weights and record readings.  . Monitor BP and inform Cardiology team of readings below established range. . Complete outreach with smoking cessation counselor.  Over the next 45 days, patient will: . Complete outreach with CCM Pharmacist to discuss prescription assistance (Trelegy)   Interventions:  . Inter-disciplinary care team collaboration (see longitudinal plan of care) . Reviewed medications and concerns regarding prescription cost. Reports self administering medications and tolerating prescribed regimen well. Currently receiving assistance with Tyvaso. Expressed concerns regarding cost of Trelegy. Agreeable to outreach with the CCM Pharmacist regarding prescription assistance. . Reviewed COPD treatment plan. Discussed current action plan, reviewed indications for notifying provider and discussed worsening s/sx that require immediate medical attention. No recent exacerbations. Has not required hospitalization or emergent care since April. Reports symptoms are well controlled with current inhalers. Currently using home O2 at 2.5L/min. Reports oxygen saturations from 95-96%. . Reviewed weight parameters and complications r/t CHF. Encouraged to monitor weight and BP daily and record readings. Reports current weight of 192 lbs and BP  around 94/54. Discussed established BP parameters and indications for notifying the Cardiology team with low readings. She is aware of need to notify provider if weight increases greater than 3 lbs overnight or more than 5 lbs within a week. She does experience exertional dyspnea but otherwise reports doing well. No recent episodes of chest discomfort, palpitations or dizziness. No abdominal or lower extremity edema. Denies changes/decline in activity tolerance. . Discussed current tobacco use and plan for smoking cessation. Currently smokes a little less than a pack a day. Reports trying nicotine patches, nicotine gum and Wellbutrin in the past. She is aware of the impact smoking has on her overall health and reports having several resources and information for smoking cessation counseling.  . Reviewed scheduled/upcoming provider appointments. Encouraged to attend scheduled medical appointments to prevent delays in care. Pending follow-up with Pain Management on 08/03/20. Pending outreach with Pulmonologist on 08/31/20. No concerns regarding transportation. . Discussed plan for ongoing care management and follow-up. Provided direct contact information. Reports doing well over the past few months. She is very motivated to achieve her weight loss goals and doing very well with a keto diet. Reports being independent in the home. Spouse is available to assist if needed. No urgent care management concerns.   Patient Self Care Activities:  . Self administers medications  . Attends scheduled provider appointments . Calls pharmacy for medication refills . Performs ADL's independently . Performs IADL's independently   Initial goal documentation        [name] was given information about Chronic Care Management services including:  1. CCM service includes personalized support from designated clinical staff supervised by her physician, including individualized plan of care and coordination with other  care providers 2. 24/7 contact phone numbers for assistance for  urgent and routine care needs. 3. Service will only be billed when office clinical staff spend 20 minutes or more in a month to coordinate care. 4. Only one practitioner may furnish and bill the service in a calendar month. 5. The patient may stop CCM services at any time (effective at the end of the month) by phone call to the office staff. 6. The patient will be responsible for cost sharing (co-pay) of up to 20% of the service fee (after annual deductible is met).  Patient agreed to services and verbal consent obtained.    [name] verbalized understanding of the information discussed during the telephonic outreach today. Declined need for a mailed/printed copy of the instructions.   Will place referral to CCM Pharmacist for prescription assistance and follow-up in November.    [facility] Care Management [facility] [phone]

## 2020-06-20 ENCOUNTER — Other Ambulatory Visit: Payer: Self-pay | Admitting: Family Medicine

## 2020-06-20 NOTE — Telephone Encounter (Signed)
Requested Prescriptions  Pending Prescriptions Disp Refills  . levothyroxine (SYNTHROID) 25 MCG tablet [Pharmacy Med Name: LEVOTHYROXINE 0.025MG (25MCG) TAB] 90 tablet 1    Sig: TAKE 1 TABLET(25 MCG) BY MOUTH DAILY BEFORE BREAKFAST     Endocrinology:  Hypothyroid Agents Failed - 06/20/2020  7:42 AM      Failed - TSH needs to be rechecked within 3 months after an abnormal result. Refill until TSH is due.      Passed - TSH in normal range and within 360 days    TSH  Date Value Ref Range Status  03/04/2020 1.520 0.450 - 4.500 uIU/mL Final         Passed - Valid encounter within last 12 months    Recent Outpatient Visits          2 months ago Essential (primary) hypertension   [place], [place]   4 months ago Weakness   [facility], Vickki Muff, [place]   6 months ago Essential (primary) hypertension   [facility], [place], [place]   7 months ago Episode of altered consciousness   [facility] [place], Dionne Bucy, MD   8 months ago SOB (shortness of breath)   [facility] [place], Kelby Aline, FNP

## 2020-06-22 DIAGNOSIS — J9610 Chronic respiratory failure, unspecified whether with hypoxia or hypercapnia: Secondary | ICD-10-CM | POA: Diagnosis not present

## 2020-06-23 ENCOUNTER — Ambulatory Visit: Payer: Self-pay

## 2020-06-23 ENCOUNTER — Telehealth: Payer: Self-pay | Admitting: *Deleted

## 2020-06-23 DIAGNOSIS — J449 Chronic obstructive pulmonary disease, unspecified: Secondary | ICD-10-CM

## 2020-06-23 NOTE — Chronic Care Management (AMB) (Signed)
  Chronic Care Management   Note  06/23/2020 Name: Theresa [name] MRN: [id number] DOB: 1956/06/28  Theresa [name] is a 64 y.o. year old female who is a primary care patient of [place], Vickki [place], [place]. Theresa [name] is currently enrolled in care management services. An additional referral for Pharmacy was placed.   Follow up plan: Unsuccessful telephone outreach attempt made. A HIPPA compliant phone message was left for the patient providing contact information and requesting a return call. The care management team will reach out to the patient again over the next 7 days. If patient returns call to provider office, please advise to call [place] at [phone].  [place] [facility]

## 2020-06-23 NOTE — Chronic Care Management (AMB) (Signed)
  Chronic Care Management   Note  06/23/2020 Name: Theresa [name] MRN: [id number] DOB: 1955-11-26    Care Coordination Only: Referral submitted to [facility] Pharmacist for medication assistance.     PLAN -Pending outreach with CCM Pharmacist. -Will complete routine follow-up as scheduled in November.    [facility] Care Management [facility] [phone]

## 2020-06-24 ENCOUNTER — Ambulatory Visit: Payer: Self-pay | Admitting: *Deleted

## 2020-06-24 NOTE — Telephone Encounter (Signed)
Patient calling in with complaint of pain to the left side of back, where side of bra strap would be located. Patient rates pain at a 7. Patient states back pain started about a week ago and also has complaints of a cough and increased sleepiness that started a week ago as well. Patient states that sometimes with coughing it takes her breath away due to the pain. Patient states she does experience some SOB at times. Patient denies difficulty breathing at the time of call and denies fever. Patient has a history of COPD and congestive heart failure and is currently on 2.5L of oxygen. Patient states her sats have been 94-95% with no activity but the drown down to 88% with getting up. Patient states she was previously diagnosed with COVID in April. Patient scheduled for virtual appt on 06/26/20. Patient advised to seek treatment in the ED for worsening symptoms. Patient verbalized understanding.  Reason for Disposition . [1] Chest pain(s) lasting a few seconds from coughing AND [2] persists > 3 days  Answer Assessment - Initial Assessment Questions 1. ONSET: "When did the pain begin?"     Like where you hook your bra on th side 2. LOCATION: "Where does it hurt?" (upper, mid or lower back)     Back mid towards the side where bra strap is 3. SEVERITY: "How bad is the pain?"  (e.g., Scale 1-10; mild, moderate, or severe)   - MILD (1-3): doesn't interfere with normal activities    - MODERATE (4-7): interferes with normal activities or awakens from sleep    - SEVERE (8-10): excruciating pain, unable to do any normal activities      7 4. PATTERN: "Is the pain constant?" (e.g., yes, no; constant, intermittent)      constant 5. RADIATION: "Does the pain shoot into your legs or elsewhere?"     no 6. CAUSE:  "What do you think is causing the back pain?"      unknown 7. BACK OVERUSE:  "Any recent lifting of heavy objects, strenuous work or exercise?"     no 8. MEDICATIONS: "What have you taken so far for the  pain?" (e.g., nothing, acetaminophen, NSAIDS)     Takes pain feels for degenerative disk, hydrocodone-5/325 mg, and lessened the the pain 9. NEUROLOGIC SYMPTOMS: "Do you have any weakness, numbness, or problems with bowel/bladder control?"     Some weakness 10. OTHER SYMPTOMS: "Do you have any other symptoms?" (e.g., fever, abdominal pain, burning with urination, blood in urine)       no 11. PREGNANCY: "Is there any chance you are pregnant?" (e.g., yes, no; LMP)       n/a  Answer Assessment - Initial Assessment Questions 1. LOCATION: "Where does it hurt?"       On the left side of back about 4 inches under shoulder blade 2. RADIATION: "Does the pain go anywhere else?" (e.g., into neck, jaw, arms, back)     no 3. ONSET: "When did the chest pain begin?" (Minutes, hours or days)      About a week ago 4. PATTERN "Does the pain come and go, or has it been constant since it started?"  "Does it get worse with exertion?"      constant 5. DURATION: "How long does it last" (e.g., seconds, minutes, hours)     constant 6. SEVERITY: "How bad is the pain?"  (e.g., Scale 1-10; mild, moderate, or severe)    - MILD (1-3): doesn't interfere with normal activities     -  MODERATE (4-7): interferes with normal activities or awakens from sleep    - SEVERE (8-10): excruciating pain, unable to do any normal activities       7 7. CARDIAC RISK FACTORS: "Do you have any history of heart problems or risk factors for heart disease?" (e.g., angina, prior heart attack; diabetes, high blood pressure, high cholesterol, smoker, or strong family history of heart disease)     Smoker, congestive heart failure 8. PULMONARY RISK FACTORS: "Do you have any history of lung disease?"  (e.g., blood clots in lung, asthma, emphysema, birth control pills)     COPD 9. CAUSE: "What do you think is causing the chest pain?"     unknown 10. OTHER SYMPTOMS: "Do you have any other symptoms?" (e.g., dizziness, nausea, vomiting, sweating,  fever, difficulty breathing, cough)       cough 11. PREGNANCY: "Is there any chance you are pregnant?" "When was your last menstrual period?"       n/a  Protocols used: CHEST PAIN-A-AH, BACK PAIN-A-AH

## 2020-06-25 ENCOUNTER — Encounter: Payer: Self-pay | Admitting: Emergency Medicine

## 2020-06-25 ENCOUNTER — Other Ambulatory Visit: Payer: Self-pay

## 2020-06-25 ENCOUNTER — Emergency Department
Admission: EM | Admit: 2020-06-25 | Discharge: 2020-06-25 | Disposition: A | Discharge status: Home / Self Care | Payer: Medicare HMO | Attending: Emergency Medicine | Admitting: Emergency Medicine

## 2020-06-25 ENCOUNTER — Emergency Department: Payer: Medicare HMO

## 2020-06-25 DIAGNOSIS — R0789 Other chest pain: Secondary | ICD-10-CM | POA: Insufficient documentation

## 2020-06-25 DIAGNOSIS — J9811 Atelectasis: Secondary | ICD-10-CM | POA: Diagnosis not present

## 2020-06-25 DIAGNOSIS — R05 Cough: Secondary | ICD-10-CM | POA: Diagnosis not present

## 2020-06-25 DIAGNOSIS — I509 Heart failure, unspecified: Secondary | ICD-10-CM | POA: Diagnosis not present

## 2020-06-25 DIAGNOSIS — R110 Nausea: Secondary | ICD-10-CM | POA: Insufficient documentation

## 2020-06-25 DIAGNOSIS — J189 Pneumonia, unspecified organism: Secondary | ICD-10-CM | POA: Diagnosis not present

## 2020-06-25 DIAGNOSIS — R079 Chest pain, unspecified: Secondary | ICD-10-CM | POA: Diagnosis not present

## 2020-06-25 DIAGNOSIS — Z5321 Procedure and treatment not carried out due to patient leaving prior to being seen by health care provider: Secondary | ICD-10-CM | POA: Insufficient documentation

## 2020-06-25 DIAGNOSIS — M546 Pain in thoracic spine: Secondary | ICD-10-CM | POA: Insufficient documentation

## 2020-06-25 DIAGNOSIS — R0602 Shortness of breath: Secondary | ICD-10-CM | POA: Insufficient documentation

## 2020-06-25 LAB — COMPREHENSIVE METABOLIC PANEL
ALT: 22 U/L (ref 0–44)
AST: 21 U/L (ref 15–41)
Albumin: 3.9 g/dL (ref 3.5–5.0)
Alkaline Phosphatase: 94 U/L (ref 38–126)
Anion gap: 12 (ref 5–15)
BUN: 21 mg/dL (ref 8–23)
CO2: 27 mmol/L (ref 22–32)
Calcium: 9.6 mg/dL (ref 8.9–10.3)
Chloride: 99 mmol/L (ref 98–111)
Creatinine, Ser: 0.83 mg/dL (ref 0.44–1.00)
GFR calc Af Amer: >60 mL/min (ref >60)
GFR calc non Af Amer: >60 mL/min (ref >60)
Glucose, Bld: 85 mg/dL (ref 70–99)
Potassium: 4.1 mmol/L (ref 3.5–5.1)
Sodium: 138 mmol/L (ref 135–145)
Total Bilirubin: 0.7 mg/dL (ref 0.3–1.2)
Total Protein: 7.9 g/dL (ref 6.5–8.1)

## 2020-06-25 LAB — CBC
HCT: 49.4 % — ABNORMAL HIGH (ref 36.0–46.0)
Hemoglobin: 15.4 g/dL — ABNORMAL HIGH (ref 12.0–15.0)
MCH: 27.8 pg (ref 26.0–34.0)
MCHC: 31.2 g/dL (ref 30.0–36.0)
MCV: 89.3 fL (ref 80.0–100.0)
Platelets: 244 10*3/uL (ref 150–400)
RBC: 5.53 MIL/uL — ABNORMAL HIGH (ref 3.87–5.11)
RDW: 19.7 % — ABNORMAL HIGH (ref 11.5–15.5)
WBC: 11.6 10*3/uL — ABNORMAL HIGH (ref 4.0–10.5)
nRBC: 0 % (ref 0.0–0.2)

## 2020-06-25 LAB — TROPONIN I (HIGH SENSITIVITY): Troponin I (High Sensitivity): 8 ng/L (ref <18)

## 2020-06-25 NOTE — ED Triage Notes (Addendum)
Pt from home via POV. Pt st intermittent SHOB for a week, along with productive cough. Pt on 2.5L [place] at baseline. St while sitting in the lobby had to increased oxygen to 3L.  C/o mid back pain since Sunday.  St intermittent central chest pain described as (squeezing) for a week along with nausea.   Denies fever/dizziness/diarrhea.  St hx: CHF on lasix, last dose this am.

## 2020-06-25 NOTE — Progress Notes (Signed)
MyChart Video Visit    Virtual Visit via Video Note   This visit type was conducted due to national recommendations for restrictions regarding the COVID-19 Pandemic (e.g. social distancing) in an effort to limit this patient's exposure and mitigate transmission in our community. This patient is at least at moderate risk for complications without adequate follow up. This format is felt to be most appropriate for this patient at this time. Physical exam was limited by quality of the video and audio technology used for the visit.   Patient location: Home Provider location: Office   I discussed the limitations of evaluation and management by telemedicine and the availability of in person appointments. The patient expressed understanding and agreed to proceed.    Patient: Theresa [name]   DOB: [birth year]   64 y.o. Female  MRN: [id number] Visit Date: 06/26/2020  Today's healthcare provider: Trinna Post, PA-C   Chief Complaint  Patient presents with  . Back Pain  . COPD   Subjective    Cough Associated symptoms include headaches and myalgias. Pertinent negatives include no chest pain.    Patient with a history of COPD, CHF, chronic pain syndrome presenting today with cough and left sided back pain. She uses oxygen continuously and is currently at 2.5L which is normal. She reports her O2 sats are 94%. She denies fevers,vomiting. She reports some chills and nausea. She has myalgias and back pain at baseline and is difficult to tell. She has not been vaccinated against COVID. She had COVID in April. She is using trelegy as directed. She has used a nebulizer treatment which helped moderately. She is coughing but it is not productive. Her current weight is 191 lbs which is stable. She reports the pain is severe and worse when she breathes. She reports yesterday she had to turn her oxygen up to 3L. She went to the ER yesterday but was unable to stand the wait time and left before being  seen. She did get a CXR done which showed improvement in bronchial thickening. She has some lower lobe opacities bilaterally which are favored to be atelectasis.     Medications: Outpatient Medications Prior to Visit  Medication Sig  . albuterol (VENTOLIN HFA) 108 (90 Base) MCG/ACT inhaler Inhale 2 puffs into the lungs every 6 (six) hours as needed for wheezing or shortness of breath.  Marland Kitchen aspirin EC 81 MG tablet Take 81 mg by mouth daily.  Derrill Memo ON 07/11/2020] baclofen (LIORESAL) 10 MG tablet Take 1 tablet (10 mg total) by mouth 3 (three) times daily.  . Cholecalciferol ([employer] D3) 2000 units CAPS Take 2,000 Units by mouth daily.   . digoxin (LANOXIN) 0.25 MG tablet TAKE 1 TABLET(0.25 MG) BY MOUTH DAILY  . Fluticasone-Umeclidin-Vilant 100-62.5-25 MCG/INH AEPB Inhale 1 puff into the lungs daily.  Derrill Memo ON 07/11/2020] gabapentin (NEURONTIN) 400 MG capsule Take 1 capsule (400 mg total) by mouth 3 (three) times daily.  Marland Kitchen HYDROcodone-acetaminophen (NORCO/VICODIN) 5-325 MG tablet Take 1 tablet by mouth every 8 (eight) hours as needed for severe pain. Must last 30 days  . HYDROcodone-acetaminophen (NORCO/VICODIN) 5-325 MG tablet Take 1 tablet by mouth every 8 (eight) hours as needed for severe pain. Must last 30 days  . [START ON 07/07/2020] HYDROcodone-acetaminophen (NORCO/VICODIN) 5-325 MG tablet Take 1 tablet by mouth every 8 (eight) hours as needed for severe pain. Must last 30 days  . ipratropium-albuterol (DUONEB) 0.5-2.5 (3) MG/3ML SOLN USE 1 VIAL VIA NEBULIZER EVERY 6 HOURS AS NEEDED  FOR SHORTNESS OF BREATH OR WHEEZING  . levothyroxine (SYNTHROID) 25 MCG tablet TAKE 1 TABLET(25 MCG) BY MOUTH DAILY BEFORE BREAKFAST  . OXYGEN Inhale 2.5 L/min into the lungs.  . potassium chloride SA (KLOR-CON) 20 MEQ tablet TAKE 1 TABLET(20 MEQ) BY MOUTH TWICE DAILY  . sertraline (ZOLOFT) 100 MG tablet TAKE 2 TABLETS BY MOUTH EVERY MORNING  . torsemide (DEMADEX) 20 MG tablet Take 2 tablets (40 mg total) by mouth  2 (two) times daily.  . Treprostinil (TYVASO) 0.6 MG/ML SOLN Inhale 18 mcg into the lungs 4 (four) times daily.   No facility-administered medications prior to visit.    Review of Systems  Constitutional: Positive for fatigue.  HENT: Positive for congestion, sinus pressure and sinus pain.   Respiratory: Positive for cough.   Cardiovascular: Negative for chest pain.  Musculoskeletal: Positive for arthralgias, back pain and myalgias.  Neurological: Positive for headaches.      Objective    There were no vitals taken for this visit.   Physical Exam Constitutional:      Appearance: Normal appearance.  Pulmonary:     Effort: Pulmonary effort is normal. No respiratory distress.  Neurological:     Mental Status: She is alert. Mental status is at baseline.  Psychiatric:        Mood and Affect: Mood normal.        Behavior: Behavior normal.        Assessment & Plan    1. Chronic obstructive pulmonary disease with acute exacerbation (HCC)  Ultimately, patient has multiple comorbidities and the DDx is wide. With significant pleuritic pain I would be mainly concerned for pulmonary embolism though the differential is not limited this and includes pneumonia not visualized on CXR, cardiac etiology. Ultimately, I have advised her to be seen in the ER for a more comprehensive evaluation but I will cover her for COPD exacerbation as below.  - doxycycline (VIBRA-TABS) 100 MG tablet; Take 1 tablet (100 mg total) by mouth 2 (two) times daily for 7 days.  Dispense: 14 tablet; Refill: 0 - predniSONE (DELTASONE) 20 MG tablet; Take 2 tablets (40 mg total) by mouth daily with breakfast for 5 days.  Dispense: 10 tablet; Refill: 0    No follow-ups on file.     I discussed the assessment and treatment plan with the patient. The patient was provided an opportunity to ask questions and all were answered. The patient agreed with the plan and demonstrated an understanding of the instructions.   The  patient was advised to call back or seek an in-person evaluation if the symptoms worsen or if the condition fails to improve as anticipated.   ITrinna Post, PA-C, have reviewed all documentation for this visit. The documentation on 06/26/20 for the exam, diagnosis, procedures, and orders are all accurate and complete.  The entirety of the information documented in the History of Present Illness, Review of Systems and Physical Exam were personally obtained by me. Portions of this information were initially documented by [facility] and reviewed by me for thoroughness and accuracy.    Paulene Floor [facility] [phone] (phone) [phone] (fax)  [place]

## 2020-06-25 NOTE — Telephone Encounter (Signed)
FYI

## 2020-06-26 ENCOUNTER — Emergency Department
Admission: EM | Admit: 2020-06-26 | Discharge: 2020-06-26 | Disposition: A | Discharge status: Home / Self Care | Payer: Medicare HMO | Attending: Student in an Organized Health Care Education/Training Program | Admitting: Student in an Organized Health Care Education/Training Program

## 2020-06-26 ENCOUNTER — Other Ambulatory Visit: Payer: Self-pay

## 2020-06-26 ENCOUNTER — Emergency Department: Payer: Medicare HMO

## 2020-06-26 ENCOUNTER — Telehealth (INDEPENDENT_AMBULATORY_CARE_PROVIDER_SITE_OTHER): Payer: Medicare HMO | Admitting: Physician Assistant

## 2020-06-26 DIAGNOSIS — Z9861 Coronary angioplasty status: Secondary | ICD-10-CM | POA: Insufficient documentation

## 2020-06-26 DIAGNOSIS — I5032 Chronic diastolic (congestive) heart failure: Secondary | ICD-10-CM | POA: Diagnosis not present

## 2020-06-26 DIAGNOSIS — J441 Chronic obstructive pulmonary disease with (acute) exacerbation: Secondary | ICD-10-CM | POA: Insufficient documentation

## 2020-06-26 DIAGNOSIS — J9811 Atelectasis: Secondary | ICD-10-CM | POA: Diagnosis not present

## 2020-06-26 DIAGNOSIS — Z79899 Other long term (current) drug therapy: Secondary | ICD-10-CM | POA: Diagnosis not present

## 2020-06-26 DIAGNOSIS — J439 Emphysema, unspecified: Secondary | ICD-10-CM | POA: Diagnosis not present

## 2020-06-26 DIAGNOSIS — F17210 Nicotine dependence, cigarettes, uncomplicated: Secondary | ICD-10-CM | POA: Diagnosis not present

## 2020-06-26 DIAGNOSIS — Z7982 Long term (current) use of aspirin: Secondary | ICD-10-CM | POA: Diagnosis not present

## 2020-06-26 DIAGNOSIS — R0602 Shortness of breath: Secondary | ICD-10-CM | POA: Diagnosis not present

## 2020-06-26 DIAGNOSIS — I110 Hypertensive heart disease with heart failure: Secondary | ICD-10-CM | POA: Insufficient documentation

## 2020-06-26 DIAGNOSIS — I2510 Atherosclerotic heart disease of native coronary artery without angina pectoris: Secondary | ICD-10-CM | POA: Diagnosis not present

## 2020-06-26 DIAGNOSIS — R0789 Other chest pain: Secondary | ICD-10-CM

## 2020-06-26 DIAGNOSIS — R079 Chest pain, unspecified: Secondary | ICD-10-CM | POA: Diagnosis not present

## 2020-06-26 DIAGNOSIS — I700 Atherosclerosis of aorta: Secondary | ICD-10-CM | POA: Diagnosis not present

## 2020-06-26 DIAGNOSIS — Z20822 Contact with and (suspected) exposure to covid-19: Secondary | ICD-10-CM | POA: Diagnosis not present

## 2020-06-26 DIAGNOSIS — I517 Cardiomegaly: Secondary | ICD-10-CM | POA: Diagnosis not present

## 2020-06-26 LAB — BASIC METABOLIC PANEL
Anion gap: 12 (ref 5–15)
BUN: 18 mg/dL (ref 8–23)
CO2: 27 mmol/L (ref 22–32)
Calcium: 9.2 mg/dL (ref 8.9–10.3)
Chloride: 101 mmol/L (ref 98–111)
Creatinine, Ser: 0.84 mg/dL (ref 0.44–1.00)
GFR calc Af Amer: >60 mL/min (ref >60)
GFR calc non Af Amer: >60 mL/min (ref >60)
Glucose, Bld: 104 mg/dL — ABNORMAL HIGH (ref 70–99)
Potassium: 4 mmol/L (ref 3.5–5.1)
Sodium: 140 mmol/L (ref 135–145)

## 2020-06-26 LAB — CBC
HCT: 49.9 % — ABNORMAL HIGH (ref 36.0–46.0)
Hemoglobin: 15.8 g/dL — ABNORMAL HIGH (ref 12.0–15.0)
MCH: 27.5 pg (ref 26.0–34.0)
MCHC: 31.7 g/dL (ref 30.0–36.0)
MCV: 86.8 fL (ref 80.0–100.0)
Platelets: 245 10*3/uL (ref 150–400)
RBC: 5.75 MIL/uL — ABNORMAL HIGH (ref 3.87–5.11)
RDW: 20 % — ABNORMAL HIGH (ref 11.5–15.5)
WBC: 11.7 10*3/uL — ABNORMAL HIGH (ref 4.0–10.5)
nRBC: 0 % (ref 0.0–0.2)

## 2020-06-26 LAB — TROPONIN I (HIGH SENSITIVITY)
Troponin I (High Sensitivity): 7 ng/L (ref <18)
Troponin I (High Sensitivity): 7 ng/L (ref <18)

## 2020-06-26 LAB — SARS CORONAVIRUS 2 BY RT PCR (HOSPITAL ORDER, PERFORMED IN ~~LOC~~ HOSPITAL LAB): SARS Coronavirus 2: NEGATIVE

## 2020-06-26 MED ORDER — PREDNISONE 20 MG PO TABS: 40.0000 mg | ORAL_TABLET | Freq: Every day | ORAL | 0 refills | Status: AC

## 2020-06-26 MED ORDER — IPRATROPIUM-ALBUTEROL 0.5-2.5 (3) MG/3ML IN SOLN: 3.0000 mL | Freq: Once | RESPIRATORY_TRACT | Status: DC

## 2020-06-26 MED ORDER — HYDROCODONE-ACETAMINOPHEN 5-325 MG PO TABS
1.0000 | ORAL_TABLET | Freq: Once | ORAL | Status: AC
  Administered 2020-06-26: 1 via ORAL
  Filled 2020-06-26: qty 1

## 2020-06-26 MED ORDER — DOXYCYCLINE HYCLATE 100 MG PO TABS: 100.0000 mg | ORAL_TABLET | Freq: Two times a day (BID) | ORAL | 0 refills | Status: AC

## 2020-06-26 MED ORDER — METHYLPREDNISOLONE SODIUM SUCC 125 MG IJ SOLR
125.0000 mg | Freq: Once | INTRAMUSCULAR | Status: AC
  Administered 2020-06-26: 125 mg via INTRAVENOUS
  Filled 2020-06-26: qty 2

## 2020-06-26 MED ORDER — IOHEXOL 350 MG/ML SOLN
75.0000 mL | Freq: Once | INTRAVENOUS | Status: AC | PRN
  Administered 2020-06-26: 75 mL via INTRAVENOUS

## 2020-06-26 NOTE — ED Provider Notes (Signed)
[facility] Emergency Department Provider Note    First MD Initiated Contact with Patient 06/26/20 1530     (approximate)  I have reviewed the triage vital signs and the nursing notes.   HISTORY  Chief Complaint Shortness of Breath    HPI Theresa [name] is a 64 y.o. female   with the below listed past medical history presents to the ER for evaluation of 1 week of progressively worsening shortness of breath and pleuritic chest pain particular the left posterior shoulder.  Has been having some chills and cough nonproductive.  No nausea or vomiting.  No low back pain.  Denies any chest pressure.  Does wear to half liters nasal cannula chronically for history of COPD.  Was admitted for COVID-19 in April.  Not currently on any blood thinners.  Evaluation by telehealth visit after she came to the ER yesterday and left due to long wait.  Had order for antibiotic and steroid for presumed bronchitis but was sent to the ER due to concern for PE.   Past Medical History:  Diagnosis Date  . Acute postoperative pain 10/03/2017  . Anxiety   . BiPAP (biphasic positive airway pressure) dependence    at hs  . Carpal tunnel syndrome   . CHF (congestive heart failure) ([place])    1/18  . CHF (congestive heart failure) ([place])   . Chronic generalized abdominal pain   . Chronic rhinitis   . COPD (chronic obstructive pulmonary disease) ([place])   . DDD (degenerative disc disease), cervical   . DDD (degenerative disc disease), lumbosacral   . Depression   . Dyspnea   . Edema   . Flu    1/18  . Gastritis   . GERD (gastroesophageal reflux disease)   . Hematuria   . Hernia of abdominal wall 07/17/2018  . Hernia, abdominal   . Hypertension   . Kidney stones   . Low back pain   . Lumbar radiculitis   . Malodorous urine   . Muscle weakness   . Obesity   . Oxygen dependent   . Renal cyst   . Sensory urge incontinence   . Thyroid activity decreased    9/19  . Tobacco abuse    . Wheezing    Family History  Problem Relation Age of Onset  . Heart disease Mother   . Stroke Mother   . Coronary artery disease Mother   . Lung cancer Sister   . Cancer Sister   . Breast cancer Sister   . Alcohol abuse Father   . Heart disease Father   . Cancer Brother   . Cancer Brother   . Pneumonia Brother   . Prostate cancer Neg Hx   . Kidney cancer Neg Hx   . Bladder Cancer Neg Hx    Past Surgical History:  Procedure Laterality Date  . ABDOMINAL HYSTERECTOMY    . CARPAL TUNNEL RELEASE Left 2012  . EPIGASTRIC HERNIA REPAIR N/A 08/13/2018   HERNIA REPAIR EPIGASTRIC ADULT; polypropylene mesh reinforcement.  Surgeon: Robert Bellow, MD;  Location: [facility] ORS;  Service: General;  Laterality: N/A;  . RIGHT/LEFT HEART CATH AND CORONARY ANGIOGRAPHY N/A 07/11/2019   Procedure: RIGHT/LEFT HEART CATH AND CORONARY ANGIOGRAPHY;  Surgeon: Yolonda Kida, MD;  Location: [facility];  Service: Cardiovascular;  Laterality: N/A;  . TONSILLECTOMY    . TUBAL LIGATION    . UMBILICAL HERNIA REPAIR N/A 08/13/2018   HERNIA REPAIR UMBILICAL ADULT; polypropylene mesh reinforcement Surgeon:  Robert Bellow, MD;  Location: [facility] ORS;  Service: General;  Laterality: N/A;   Patient Active Problem List   Diagnosis Date Noted  . Uncomplicated opioid dependence ([place]) 04/08/2020  . Pharmacologic therapy 04/08/2020  . Pneumonia due to COVID-19 virus 02/03/2020  . Numbness and tingling in right hand 12/31/2019  . Numbness in feet 12/31/2019  . Altered consciousness 11/26/2019  . Frequent falls 11/26/2019  . Weakness of both lower extremities 11/26/2019  . Protruded lumbar disc (L2-3) (Left) 09/10/2019  . Chronic knee pain (Left) 08/14/2019  . Chronic lumbar radiculopathy/radiculitis (L2) (Left) 08/14/2019  . Abnormal MRI, lumbar spine (07/23/2019) 08/14/2019  . Acute on chronic respiratory failure with hypoxia ([place]) 07/06/2019  . Cervicalgia (Bilateral) (R>L) 09/24/2018  .  Spondylosis without myelopathy or radiculopathy, lumbosacral region 09/24/2018  . Epigastric hernia 05/04/2018  . Chronic lower extremity pain (Bilateral) (L>R) 01/25/2018  . Altered mental status, unspecified 12/09/2017  . Chronic hip pain [facility] Area of Pain) (Bilateral) (L>R) 11/27/2017  . COPD (chronic obstructive pulmonary disease) ([place]) 08/11/2017  . Other long term (current) drug therapy 08/08/2017  . Disorder of bone, unspecified 08/08/2017  . COPD with hypoxia ([place]) 06/27/2017  . Arthralgia of acromioclavicular joint (Right) 06/15/2017  . Acromioclavicular joint DJD (Right) 06/15/2017  . 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  . Chronic shoulder pain (Left) 05/11/2017  . Elevated brain natriuretic peptide (BNP) level 05/11/2017  . Grief at loss of child 02/14/2017  . Lumbar facet hypertrophy (multilevel) 11/17/2016  . Lumbar spinal stenosis (L4-5) 11/17/2016  . 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) 11/17/2016  . 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 Lumbosacral radiculopathy at L4 11/17/2016  . Supplemental oxygen dependent 08/01/2016  . Sleep apnea 06/02/2016  . Long term current use of opiate analgesic 01/12/2016  . Long term prescription opiate use 01/12/2016  . Opiate use (15 MME/Day) 01/12/2016  . Lumbar facet syndrome (Bilateral) (L>R) 01/12/2016  . Chronic sacroiliac joint pain (Bilateral) 01/12/2016  . Vitamin D deficiency 12/30/2015  . 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 Obesity, Class III, BMI 40-49.9 (morbid obesity) ([place]) 12/28/2015  . 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[place]) 11/19/2015  . Coagulation disorder ([place]) 10/22/2015  . Chronic pain syndrome (significant psychosocial component) 09/21/2015  . 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 09/21/2015  . Encounter for therapeutic drug level monitoring 09/16/2015  . Encounter for chronic pain management 09/16/2015    . Pain management 09/16/2015  . Neurogenic pain 09/16/2015  . Neuropathic pain 09/16/2015  . Musculoskeletal pain 09/16/2015  . Lumbar spondylosis (L4-5) 09/16/2015  . Chronic low back pain (Primary Area of Pain) (Bilateral) (L>R) 09/16/2015  . Chronic lower extremity pain (Secondary Area of Pain) (Left) 09/16/2015  . Chronic lumbar radicular pain (L4 & S1 Dermatome) (Left) 09/16/2015  . Chronic shoulder pain (Right side) 09/16/2015  . Chronic upper extremity pain (Right-sided) 09/16/2015  . Cervical radiculitis (Right side) 09/16/2015  . Abnormal x-ray of lumbar spine 09/16/2015  . Chronic diastolic heart failure ([place]) 07/15/2015  . Chest pain 07/15/2015  . Tobacco abuse 07/15/2015  . Tendinitis 04/21/2015  . Blood clotting disorder ([place]) 04/21/2015  . Decreased motor strength 07/16/2014  . Clinical depression 07/16/2014  . Chronic rhinitis 07/16/2014  . Carpal tunnel syndrome 07/16/2014  . Anxiety state 07/16/2014  . Nausea without vomiting 07/16/2014  . Edema 07/16/2014  . Generalized muscle weakness 07/16/2014  . Neuritis or radiculitis due to rupture of lumbar intervertebral disc 07/02/2014  . DDD (  degenerative disc disease), lumbar 07/02/2014  . CAFL (chronic airflow limitation) ([place]) 02/27/2014  . Herpes zoster 07/16/2009  . Essential (primary) hypertension 03/27/2008      Prior to Admission medications   Medication Sig Start Date End Date Taking? Authorizing Provider  albuterol (VENTOLIN HFA) 108 (90 Base) MCG/ACT inhaler Inhale 2 puffs into the lungs every 6 (six) hours as needed for wheezing or shortness of breath. 10/23/19   Rudene Re, MD  aspirin EC 81 MG tablet Take 81 mg by mouth daily.    [provider]  baclofen (LIORESAL) 10 MG tablet Take 1 tablet (10 mg total) by mouth 3 (three) times daily. 07/11/20 01/07/21  Milinda Pointer, MD  Cholecalciferol ([employer] D3) 2000 units CAPS Take 2,000 Units by mouth daily.     [provider]   digoxin (LANOXIN) 0.25 MG tablet TAKE 1 TABLET(0.25 MG) BY MOUTH DAILY 05/28/20   Chrismon, Vickki Muff, PA  doxycycline (VIBRA-TABS) 100 MG tablet Take 1 tablet (100 mg total) by mouth 2 (two) times daily for 7 days. 06/26/20 07/03/20  Trinna Post, PA-C  Fluticasone-Umeclidin-Vilant 100-62.5-25 MCG/INH AEPB Inhale 1 puff into the lungs daily. 10/03/18   [provider]  gabapentin (NEURONTIN) 400 MG capsule Take 1 capsule (400 mg total) by mouth 3 (three) times daily. 07/11/20 01/07/21  Milinda Pointer, MD  HYDROcodone-acetaminophen (NORCO/VICODIN) 5-325 MG tablet Take 1 tablet by mouth every 8 (eight) hours as needed for severe pain. Must last 30 days 05/08/20 06/07/20  Milinda Pointer, MD  HYDROcodone-acetaminophen (NORCO/VICODIN) 5-325 MG tablet Take 1 tablet by mouth every 8 (eight) hours as needed for severe pain. Must last 30 days 06/07/20 07/07/20  Milinda Pointer, MD  HYDROcodone-acetaminophen (NORCO/VICODIN) 5-325 MG tablet Take 1 tablet by mouth every 8 (eight) hours as needed for severe pain. Must last 30 days 07/07/20 08/06/20  Milinda Pointer, MD  ipratropium-albuterol (DUONEB) 0.5-2.5 (3) MG/3ML SOLN USE 1 VIAL VIA NEBULIZER EVERY 6 HOURS AS NEEDED FOR SHORTNESS OF BREATH OR WHEEZING 10/14/19   Chrismon, Vickki Muff, PA  levothyroxine (SYNTHROID) 25 MCG tablet TAKE 1 TABLET(25 MCG) BY MOUTH DAILY BEFORE BREAKFAST 06/20/20   Chrismon, Vickki Muff, PA  OXYGEN Inhale 2.5 L/min into the lungs.    [provider]  potassium chloride SA (KLOR-CON) 20 MEQ tablet TAKE 1 TABLET(20 MEQ) BY MOUTH TWICE DAILY 09/15/19   Darylene Price A, FNP  predniSONE (DELTASONE) 20 MG tablet Take 2 tablets (40 mg total) by mouth daily with breakfast for 5 days. 06/26/20 07/01/20  Trinna Post, PA-C  sertraline (ZOLOFT) 100 MG tablet TAKE 2 TABLETS BY MOUTH EVERY MORNING 11/24/19   Chrismon, Vickki Muff, PA  torsemide (DEMADEX) 20 MG tablet Take 2 tablets (40 mg total) by mouth 2 (two) times daily.  05/21/20   [name], FNP  Treprostinil (TYVASO) 0.6 MG/ML SOLN Inhale 18 mcg into the lungs 4 (four) times daily.    [provider]    Allergies Codeine and Meloxicam    Social History Social History   Tobacco Use  . Smoking status: Current Every Day Smoker    Packs/day: 0.50    Years: 44.00    Pack years: 22.00    Types: Cigarettes  . Smokeless tobacco: Never Used  . Tobacco comment: over a half pack  Vaping Use  . Vaping Use: Never used  Substance Use Topics  . Alcohol use: No  . Drug use: No    Review of Systems Patient denies headaches, rhinorrhea, blurry vision,  numbness, shortness of breath, chest pain, edema, cough, abdominal pain, nausea, vomiting, diarrhea, dysuria, fevers, rashes or hallucinations unless otherwise stated above in HPI. ____________________________________________   PHYSICAL EXAM:  VITAL SIGNS: Vitals:   06/26/20 1300 06/26/20 1522  BP: 114/63 112/73  Pulse: 79 78  Resp: (!) 22 20  Temp: 99 F (37.2 C) 98.2 F (36.8 C)  SpO2: 91% 91%    Constitutional: Alert and oriented.  Eyes: Conjunctivae are normal.  Head: Atraumatic. Nose: No congestion/rhinnorhea. Mouth/Throat: Mucous membranes are moist.   Neck: No stridor. Painless ROM.  Cardiovascular: Normal rate, regular rhythm. Grossly normal heart sounds.  Good peripheral circulation. Respiratory: Normal respiratory effort.  No retractions. Lungs with scattered coarse wheeze throughout. Gastrointestinal: Soft and nontender. No distention. No abdominal bruits. No CVA tenderness. Genitourinary:  Musculoskeletal: No lower extremity tenderness nor edema.  No joint effusions. Neurologic:  Normal speech and language. No gross focal neurologic deficits are appreciated. No facial droop Skin:  Skin is warm, dry and intact. No rash noted. Psychiatric: Mood and affect are normal. Speech and behavior are normal.  ____________________________________________   LABS (all labs  ordered are listed, but only abnormal results are displayed)  Results for orders placed or performed during the hospital encounter of 06/26/20 (from the past 24 hour(s))  Basic metabolic panel     Status: Abnormal   Collection Time: 06/26/20 11:08 AM  Result Value Ref Range   Sodium 140 135 - 145 mmol/L   Potassium 4.0 3.5 - 5.1 mmol/L   Chloride 101 98 - 111 mmol/L   CO2 27 22 - 32 mmol/L   Glucose, Bld 104 (H) 70 - 99 mg/dL   BUN 18 8 - 23 mg/dL   Creatinine, Ser 0.84 0.44 - 1.00 mg/dL   Calcium 9.2 8.9 - 10.3 mg/dL   GFR calc non Af Amer >60 >60 mL/min   GFR calc Af Amer >60 >60 mL/min   Anion gap 12 5 - 15  CBC     Status: Abnormal   Collection Time: 06/26/20 11:08 AM  Result Value Ref Range   WBC 11.7 (H) 4.0 - 10.5 K/uL   RBC 5.75 (H) 3.87 - 5.11 MIL/uL   Hemoglobin 15.8 (H) 12.0 - 15.0 g/dL   HCT 49.9 (H) 36 - 46 %   MCV 86.8 80.0 - 100.0 fL   MCH 27.5 26.0 - 34.0 pg   MCHC 31.7 30.0 - 36.0 g/dL   RDW 20.0 (H) 11.5 - 15.5 %   Platelets 245 150 - 400 K/uL   nRBC 0.0 0.0 - 0.2 %  Troponin I (High Sensitivity)     Status: None   Collection Time: 06/26/20 11:08 AM  Result Value Ref Range   Troponin I (High Sensitivity) 7 <18 ng/L  Troponin I (High Sensitivity)     Status: None   Collection Time: 06/26/20  1:21 PM  Result Value Ref Range   Troponin I (High Sensitivity) 7 <18 ng/L  SARS Coronavirus 2 by RT PCR (hospital order, performed in [place] hospital lab) Nasopharyngeal Nasopharyngeal Swab     Status: None   Collection Time: 06/26/20  4:55 PM   Specimen: Nasopharyngeal Swab  Result Value Ref Range   SARS Coronavirus 2 NEGATIVE NEGATIVE   ____________________________________________  EKG My review and personal interpretation at Time: 11:02   Indication: sob  Rate: 90  Rhythm: sinus Axis: left Other: nonspecific st and t wave abn, c/w previous tracings ____________________________________________  RADIOLOGY  I personally reviewed all radiographic  images  ordered to evaluate for the above acute complaints and reviewed radiology reports and findings.  These findings were personally discussed with the patient.  Please see medical record for radiology report.  ____________________________________________   PROCEDURES  Procedure(s) performed:  Procedures    Critical Care performed: no ____________________________________________   INITIAL IMPRESSION / ASSESSMENT AND PLAN / ED COURSE  Pertinent labs & imaging results that were available during my care of the patient were reviewed by me and considered in my medical decision making (see chart for details).   DDX: covid 19, Asthma, copd, CHF, pna, ptx, malignancy, Pe, anemia   Kirandeep Fariss is a 64 y.o. who presents to the ED with symptoms as described above. Patient very pleasant and nontoxic-appearing but describing pleuritic chest pain post Covid illness not on anticoagulation. I suspect clinically is most likely secondary to CPPD and bronchitis but given her risk factors and presenting symptoms will order CTA to evaluate for PE. Have a lower suspicion for ACS.  Clinical Course as of Jun 26 1838  Fri Jun 26, 2020  1539 BUN: 18 [PR]  1837 Patient feels well. CT imaging is reassuring. Given her negative enzymes and stable EKG do not feel this consistent with ACS. Do suspect chronic bronchitis. Patient feels well and is requesting discharge home which I think is appropriate at this time.   [PR]    Clinical Course User Index [PR] Merlyn Lot, MD    The patient was evaluated in Emergency Department today for the symptoms described in the history of present illness. He/she was evaluated in the context of the global COVID-19 pandemic, which necessitated consideration that the patient might be at risk for infection with the SARS-CoV-2 virus that causes COVID-19. Institutional protocols and algorithms that pertain to the evaluation of patients at risk for COVID-19 are in a state of rapid  change based on information released by regulatory bodies including the CDC and federal and state organizations. These policies and algorithms were followed during the patient's care in the ED.  As part of my medical decision making, I reviewed the following data within the [place] notes reviewed and incorporated, Labs reviewed, notes from prior ED visits and [place] Controlled Substance Database   ____________________________________________   FINAL CLINICAL IMPRESSION(S) / ED DIAGNOSES  Final diagnoses:  COPD exacerbation ([place])  Atypical chest pain      NEW MEDICATIONS STARTED DURING THIS VISIT:  New Prescriptions   No medications on file     Note:  This document was prepared using Dragon voice recognition software and may include unintentional dictation errors.    Merlyn Lot, MD 06/26/20 1840

## 2020-06-26 NOTE — ED Notes (Signed)
Pt signed paper copy of d/c paperwork due to inaccessibility to signature pad. Paper form placed in chart.

## 2020-06-26 NOTE — ED Triage Notes (Addendum)
Pt come via POV from home with c/o SOB and some upper back pain. Pt states this has been going on and was here yesterday but left before being seen. Pt states her PCP advised her to come back today to ED.  Pt states she wears 2.5L [place] daily. Pt has hx of COPD  Pt denies any CP today

## 2020-07-02 ENCOUNTER — Telehealth: Payer: Self-pay

## 2020-07-02 NOTE — Telephone Encounter (Signed)
Copied from [place] [phone]. Topic: General - Inquiry >> Jul 01, 2020  5:05 PM Gillis Ends D wrote: Reason for CRM: Patient called for a follow up visit and she stated that she did have a sore throat and she never got her taste or smell back from when she had covid last year.She was told by the ER to make a follow up with her doctor, She didn't want a virtual appointment because she wants her lungs checked. The virtual visit was on September 27 and she stated that was too long away. So She wants a call back because she wants to be seen sooner. Please call her back at [phone]. Please advise the patient

## 2020-07-02 NOTE — Telephone Encounter (Signed)
Copied from [place] [phone]. Topic: Appointment Scheduling - Scheduling Inquiry for Clinic >> Jul 02, 2020  1:55 PM Erick Blinks wrote: Pt would like a call back from office with sooner available options for scheduling. Pt needs a hosp fu as soon as possible. Please advise Best contact: [phone]

## 2020-07-02 NOTE — Telephone Encounter (Signed)
I called and spoke with patient. Patient wants to come in tomorrow 07/03/2020 at 11am to see [facility]. Can someone schedule this appointment. There is no 11am slot on Dennis's schedule for me to make the appointment.

## 2020-07-02 NOTE — Telephone Encounter (Signed)
May schedule follow up appointment 11:00 am tomorrow or Monday1:00 pm

## 2020-07-03 ENCOUNTER — Ambulatory Visit (INDEPENDENT_AMBULATORY_CARE_PROVIDER_SITE_OTHER): Payer: Medicare HMO | Admitting: Family Medicine

## 2020-07-03 ENCOUNTER — Encounter: Payer: Self-pay | Admitting: Family Medicine

## 2020-07-03 ENCOUNTER — Other Ambulatory Visit: Payer: Self-pay

## 2020-07-03 VITALS — BP 113/73 | HR 97 | Temp 98.3°F | Wt 194.8 lb

## 2020-07-03 DIAGNOSIS — J441 Chronic obstructive pulmonary disease with (acute) exacerbation: Secondary | ICD-10-CM | POA: Diagnosis not present

## 2020-07-03 DIAGNOSIS — Z23 Encounter for immunization: Secondary | ICD-10-CM

## 2020-07-03 NOTE — Progress Notes (Addendum)
Established patient visit   Patient: Theresa [name]   DOB: Apr 04, 1956   64 y.o. Female  MRN: [id number] Visit Date: 07/03/2020  Today's healthcare provider: [name], PA   Chief Complaint  Patient presents with   Follow-up   Subjective    HPI   Follow up ER visit  Patient was seen in ER for Shortness of Breath on 06/26/2020. She was treated for COPD and Atypical chest pain. Treatment for this included see chart notes. She reports excellent compliance with treatment. She reports this condition is Unchanged.  ----------------------------------------------------------------------------------------- Patient says that she is about the same since she left the ER but is here in office today to have her lungs checked to see how she is doing.   Patient is having some pain on left side of her body near lungs.   Social History   Tobacco Use   Smoking status: Current Every Day Smoker    Packs/day: 0.50    Years: 44.00    Pack years: 22.00    Types: Cigarettes   Smokeless tobacco: Never Used   Tobacco comment: over a half pack  Vaping Use   Vaping Use: Never used  Substance Use Topics   Alcohol use: No   Drug use: No   Past Medical History:  Diagnosis Date   Acute postoperative pain 10/03/2017   Anxiety    BiPAP (biphasic positive airway pressure) dependence    at hs   Carpal tunnel syndrome    CHF (congestive heart failure) ([place])    1/18   CHF (congestive heart failure) ([facility])    Chronic generalized abdominal pain    Chronic rhinitis    COPD (chronic obstructive pulmonary disease) ([facility])    DDD (degenerative disc disease), cervical    DDD (degenerative disc disease), lumbosacral    Depression    Dyspnea    Edema    Flu    1/18   Gastritis    GERD (gastroesophageal reflux disease)    Hematuria    Hernia of abdominal wall 07/17/2018   Hernia, abdominal    Hypertension    Kidney stones    Low back pain    Lumbar radiculitis    Malodorous urine     Muscle weakness    Obesity    Oxygen dependent    Renal cyst    Sensory urge incontinence    Thyroid activity decreased    9/19   Tobacco abuse    Wheezing    Past Surgical History:  Procedure Laterality Date   ABDOMINAL HYSTERECTOMY     CARPAL TUNNEL RELEASE Left 2012   EPIGASTRIC HERNIA REPAIR N/A 08/13/2018   HERNIA REPAIR EPIGASTRIC ADULT; polypropylene mesh reinforcement.  Surgeon: Robert Bellow, MD;  Location: [facility];  Service: General;  Laterality: N/A;   RIGHT/LEFT HEART CATH AND CORONARY ANGIOGRAPHY N/A 07/11/2019   Procedure: RIGHT/LEFT HEART CATH AND CORONARY ANGIOGRAPHY;  Surgeon: Yolonda Kida, MD;  Location: [facility];  Service: Cardiovascular;  Laterality: N/A;   TONSILLECTOMY     TUBAL LIGATION     UMBILICAL HERNIA REPAIR N/A 08/13/2018   HERNIA REPAIR UMBILICAL ADULT; polypropylene mesh reinforcement Surgeon: Robert Bellow, MD;  Location: [facility];  Service: General;  Laterality: N/A;   Family History  Problem Relation Age of Onset   Heart disease Mother    Stroke Mother    Coronary artery disease Mother    Lung cancer Sister    Cancer Sister  Breast cancer Sister    Alcohol abuse Father    Heart disease Father    Cancer Brother    Cancer Brother    Pneumonia Brother    Prostate cancer Neg Hx    Kidney cancer Neg Hx    Bladder Cancer Neg Hx    Allergies  Allergen Reactions   Codeine Nausea And Vomiting   Meloxicam Other (See Comments)    Stomach pain   Medications: Outpatient Medications Prior to Visit  Medication Sig   albuterol (VENTOLIN HFA) 108 (90 Base) MCG/ACT inhaler Inhale 2 puffs into the lungs every 6 (six) hours as needed for wheezing or shortness of breath.   aspirin EC 81 MG tablet Take 81 mg by mouth daily.   [START ON 07/11/2020] baclofen (LIORESAL) 10 MG tablet Take 1 tablet (10 mg total) by mouth 3 (three) times daily.   Cholecalciferol ([facility] D3) 2000 units CAPS Take 2,000 Units by mouth daily.     digoxin (LANOXIN) 0.25 MG tablet TAKE 1 TABLET(0.25 MG) BY MOUTH DAILY   doxycycline (VIBRA-TABS) 100 MG tablet Take 1 tablet (100 mg total) by mouth 2 (two) times daily for 7 days.   Fluticasone-Umeclidin-Vilant 100-62.5-25 MCG/INH AEPB Inhale 1 puff into the lungs daily.   [START ON 07/11/2020] gabapentin (NEURONTIN) 400 MG capsule Take 1 capsule (400 mg total) by mouth 3 (three) times daily.   HYDROcodone-acetaminophen (NORCO/VICODIN) 5-325 MG tablet Take 1 tablet by mouth every 8 (eight) hours as needed for severe pain. Must last 30 days   [START ON 07/07/2020] HYDROcodone-acetaminophen (NORCO/VICODIN) 5-325 MG tablet Take 1 tablet by mouth every 8 (eight) hours as needed for severe pain. Must last 30 days   ipratropium-albuterol (DUONEB) 0.5-2.5 (3) MG/3ML SOLN USE 1 VIAL VIA NEBULIZER EVERY 6 HOURS AS NEEDED FOR SHORTNESS OF BREATH OR WHEEZING   levothyroxine (SYNTHROID) 25 MCG tablet TAKE 1 TABLET(25 MCG) BY MOUTH DAILY BEFORE BREAKFAST   OXYGEN Inhale 2.5 L/min into the lungs.   potassium chloride SA (KLOR-CON) 20 MEQ tablet TAKE 1 TABLET(20 MEQ) BY MOUTH TWICE DAILY   sertraline (ZOLOFT) 100 MG tablet TAKE 2 TABLETS BY MOUTH EVERY MORNING   torsemide (DEMADEX) 20 MG tablet Take 2 tablets (40 mg total) by mouth 2 (two) times daily.   Treprostinil (TYVASO) 0.6 MG/ML SOLN Inhale 18 mcg into the lungs 4 (four) times daily.   HYDROcodone-acetaminophen (NORCO/VICODIN) 5-325 MG tablet Take 1 tablet by mouth every 8 (eight) hours as needed for severe pain. Must last 30 days   No facility-administered medications prior to visit.    Review of Systems  Constitutional: Negative.   HENT: Negative.    Respiratory:  Positive for shortness of breath and wheezing.   Cardiovascular: Negative.   Gastrointestinal: Negative.      Objective    BP 113/73 (BP Location: Left Arm, Patient Position: Sitting, Cuff Size: Large)   Pulse 97   Temp 98.3 F (36.8 C) (Oral)   Wt 194 lb 12.8 oz (88.4 kg)   BMI  35.63 kg/m  SpO2: 91% BP Readings from Last 3 Encounters:  07/03/20 113/73  06/26/20 112/73  06/25/20 126/81   Wt Readings from Last 3 Encounters:  07/03/20 194 lb 12.8 oz (88.4 kg)  06/26/20 191 lb (86.6 kg)  06/25/20 191 lb (86.6 kg)    Physical Exam Constitutional:      General: She is not in acute distress.    Appearance: She is well-developed.  HENT:     Head: Normocephalic and atraumatic.  Right Ear: Hearing normal.     Left Ear: Hearing normal.     Nose: Nose normal.  Eyes:     General: Lids are normal. No scleral icterus.       Right eye: No discharge.        Left eye: No discharge.     Conjunctiva/sclera: Conjunctivae normal.  Cardiovascular:     Rate and Rhythm: Normal rate and regular rhythm.     Heart sounds: Normal heart sounds.  Pulmonary:     Effort: No respiratory distress.     Breath sounds: Wheezing and rhonchi present.  Abdominal:     General: Bowel sounds are normal.     Palpations: Abdomen is soft.  Musculoskeletal:        General: Normal range of motion.     Cervical back: Neck supple.  Skin:    Findings: No lesion or rash.  Neurological:     Mental Status: She is alert and oriented to person, place, and time.  Psychiatric:        Speech: Speech normal.        Behavior: Behavior normal.        Thought Content: Thought content normal.      No results found for any visits on 07/03/20.  Assessment & Plan     1. Chronic obstructive pulmonary disease with acute exacerbation ([place]) Evaluated in the ER on 06-26-20 to evaluate a week of progressive worsening of dyspnea and a pleuritic chest pain into the left shoulder. Uses oxygen at 2.5 LPM chronically but still smokes 1/2 ppd. CTA and labs negative for PE. Treated with steroids and inhalation therapy. Feeling better since treatments and Doxycycline for 7 days. Encouraged to continue inhalation therapy at home and to stop all smoking. Should continue regular follow up with pulmonologist (Dr.  Raul Del).  2. Need for influenza vaccination - Flu Vaccine QUAD 6+ mos PF IM (Fluarix Quad PF)    No follow-ups on file.      Andres Shad, PA, have reviewed all documentation for this visit. The documentation on 07/03/20 for the exam, diagnosis, procedures, and orders are all accurate and complete.    [name], [place] [phone] (phone) [phone] (fax)  [place]

## 2020-07-07 NOTE — Chronic Care Management (AMB) (Signed)
  Chronic Care Management   Outreach Note  07/07/2020 Name: Reyanna Baley MRN: [id number] DOB: [birth year]  Maily Debarge is a 64 y.o. year old female who is a primary care patient of Chrismon, Vickki Muff, [place]. I reached out to [name] by phone today in response to a referral sent by Ms. Judeen Hammans Printz's PCP, Chrismon, Vickki Muff, PA.     A second unsuccessful telephone outreach was attempted today. The patient was referred to the case management team for assistance with care management and care coordination.   Follow Up Plan: A HIPAA compliant phone message was left for the patient providing contact information and requesting a return call. The care management team will reach out to the patient again over the next 7 days. If patient returns call to provider office, please advise to call Tom Green at [phone].  [place] [facility]

## 2020-07-09 DIAGNOSIS — J449 Chronic obstructive pulmonary disease, unspecified: Secondary | ICD-10-CM | POA: Diagnosis not present

## 2020-07-13 ENCOUNTER — Inpatient Hospital Stay: Payer: Medicare HMO | Admitting: Family Medicine

## 2020-07-14 NOTE — Chronic Care Management (AMB) (Signed)
  Chronic Care Management   Outreach Note  07/14/2020 Name: Theresa [name] MRN: [id number] DOB: 1956-05-15  [name] is a 64 y.o. year old female who is a primary care patient of Chrismon, Vickki Muff, [place]. I reached out to [name] by phone today in response to a referral sent by [name]'s PCP, Chrismon, Vickki Muff, PA.      Third unsuccessful telephone outreach was attempted today. The patient was referred to the case management team for assistance with care management and care coordination. The patient's primary care provider has been notified of our unsuccessful attempts to make or maintain contact with the patient. The care management team is pleased to engage with this patient at any time in the future should he/she be interested in assistance from the care management team.   Follow Up Plan: We have been unable to make contact with the patient for follow up. The care management team is available to follow up with the patient after provider conversation with the patient regarding recommendation for care management engagement and subsequent re-referral to the care management team.   [facility]

## 2020-07-22 DIAGNOSIS — J9610 Chronic respiratory failure, unspecified whether with hypoxia or hypercapnia: Secondary | ICD-10-CM | POA: Diagnosis not present

## 2020-07-29 DIAGNOSIS — B078 Other viral warts: Secondary | ICD-10-CM | POA: Diagnosis not present

## 2020-07-29 DIAGNOSIS — L570 Actinic keratosis: Secondary | ICD-10-CM | POA: Diagnosis not present

## 2020-07-29 DIAGNOSIS — D239 Other benign neoplasm of skin, unspecified: Secondary | ICD-10-CM | POA: Diagnosis not present

## 2020-07-29 DIAGNOSIS — L918 Other hypertrophic disorders of the skin: Secondary | ICD-10-CM | POA: Diagnosis not present

## 2020-07-29 DIAGNOSIS — L578 Other skin changes due to chronic exposure to nonionizing radiation: Secondary | ICD-10-CM | POA: Diagnosis not present

## 2020-07-29 DIAGNOSIS — L821 Other seborrheic keratosis: Secondary | ICD-10-CM | POA: Diagnosis not present

## 2020-08-01 NOTE — Progress Notes (Signed)
PROVIDER NOTE: Information contained herein reflects review and annotations entered in association with encounter. Interpretation of such information and data should be left to medically-trained personnel. Information provided to patient can be located elsewhere in the medical record under "Patient Instructions". Document created using STT-dictation technology, any transcriptional errors that may result from process are unintentional.    Patient: Theresa [name]  Service Category: E/M  Provider: Gaspar Cola, MD  DOB: 07/01/1956  DOS: 08/03/2020  Specialty: Interventional Pain Management  MRN: [id number]  Setting: Ambulatory outpatient  PCP: Margo Common, PA  Type: Established Patient    Referring Provider: Margo Common, PA  Location: Office  Delivery: Face-to-face     HPI  Theresa [name], a 64 y.o. year old female, is here today because of her Chronic pain syndrome [G89.4]. Ms. Posa primary complain today is 64 Back Pain (lower) Last encounter: My last encounter with her was on 05/06/2020. Pertinent problems: Ms. Liberman has Neuritis or radiculitis due to rupture of lumbar intervertebral disc; Decreased motor strength; DDD (degenerative disc disease), lumbar; Carpal tunnel syndrome; Neurogenic pain; Neuropathic pain; Musculoskeletal pain; Lumbar spondylosis (L4-5); Chronic low back pain (Primary Area of Pain) (Bilateral) (L>R); Chronic lower extremity pain (Secondary Area of Pain) (Left); Chronic lumbar radicular pain (L4 & S1 Dermatome) (Left); Chronic shoulder pain (Right side); Chronic upper extremity pain (Right-sided); Cervical radiculitis (Right side); Abnormal x-ray of lumbar spine; Chronic pain syndrome (significant psychosocial component); 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; 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); Lumbar facet syndrome (Bilateral) (L>R); Chronic sacroiliac joint pain (Bilateral); Lumbar facet hypertrophy (multilevel); Lumbar  spinal stenosis (L4-5); 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); 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 Pain Assessment: Severity of Chronic pain is reported as a 8 /10. Location: Back Right, Left/down back of legs bilat; radiating pain is intermittent. Onset: More than a month ago. Quality: Dull. Timing: Constant. Modifying factor(s): meds, icy hot, rest. Vitals:  height is _0  (1.626 m) and weight is 192 lb (87.1 kg). Her temporal temperature is 97.3 F (36.3 C) (abnormal). Her blood pressure is 134/80 and her pulse is 78. Her respiration is 16 and oxygen saturation is 95%.   Reason for encounter: medication management.  The patient indicates doing well with the current medication regimen. No adverse reactions or side effects reported to the medications.  RTCB: 11/04/2020 Nonopioids transferred (08/03/2020): Baclofen and Neurontin  Pharmacotherapy Assessment   Analgesic: Hydrocodone/APAP 5/325 one tablet every 8 hours (15 mg/day of hydrocodone) MME/day:15 mg/day.   Monitoring: [place] PMP: PDMP reviewed during this encounter.       Pharmacotherapy: No side-effects or adverse reactions reported. Compliance: No problems identified. Effectiveness: Clinically acceptable.  Rise Patience, RN  08/03/2020 11:23 AM  Sign when Signing Visit Nursing Pain Medication Assessment:  Safety precautions to be  maintained throughout the outpatient stay will include: orient to surroundings, keep bed in low position, maintain call bell within reach at all times, provide assistance with transfer out of bed and ambulation.  Medication Inspection Compliance: Pill count conducted under aseptic conditions, in front of  the patient. Neither the pills nor the bottle was removed from the patient's sight at any time. Once count was completed pills were immediately returned to the patient in their original bottle.  Medication: Hydrocodone/APAP Pill/Patch Count: 15 of 90 pills remain Pill/Patch Appearance: Markings consistent with prescribed medication Bottle Appearance: Standard pharmacy container. Clearly labeled. Filled Date: 76 / 23 / 2021 Last Medication intake:  Today    UDS:  Summary  Date Value Ref Range Status  04/14/2020 Note  Final    Comment:    ==================================================================== [employer] 13 (MW) ==================================================================== Test                             Result       Flag       Units  Drug Present and Declared for Prescription Verification   Hydrocodone                    2142         EXPECTED   ng/mg creat   Hydromorphone                  263          EXPECTED   ng/mg creat   Dihydrocodeine                 156          EXPECTED   ng/mg creat   Norhydrocodone                 2151         EXPECTED   ng/mg creat    Sources of hydrocodone include scheduled prescription medications.    Hydromorphone, dihydrocodeine and norhydrocodone are expected    metabolites of hydrocodone. Hydromorphone and dihydrocodeine are    also available as scheduled prescription medications.  ==================================================================== Test                      Result    Flag   Units      Ref Range   Creatinine              43               mg/dL      >=20  ==================================================================== Declared Medications:  The flagging and interpretation on this report are based on the  following declared medications.  Unexpected results may arise from  inaccuracies in the declared medications.   **Note: The testing scope of this panel includes these medications:   Hydrocodone (Norco)   **Note: The testing scope of this panel does not include the  following reported medications:   Acetaminophen (Norco)  Albuterol (Ventolin HFA)  Albuterol (Duoneb)  Aspirin  Baclofen (Lioresal)  Cholecalciferol  Digoxin (Lanoxin)  Fluticasone  Gabapentin (Neurontin)  Ipratropium (Duoneb)  Levothyroxine (Synthroid)  Potassium (Klor-Con)  Sertraline (Zoloft)  Torsemide (Demadex)  Umeclidinium  Vilanterol ==================================================================== For clinical consultation, please call [phone]. ====================================================================      ROS  Constitutional: Denies any fever or chills Gastrointestinal: No reported hemesis, hematochezia, vomiting, or acute GI distress Musculoskeletal: Denies any acute onset joint swelling, redness, loss of ROM, or weakness Neurological: No reported episodes of acute onset apraxia, aphasia, dysarthria, agnosia, amnesia, paralysis, loss of coordination, or loss of consciousness  Medication Review  Cholecalciferol, Fluticasone-Umeclidin-Vilant, HYDROcodone-acetaminophen, Oxygen-Helium, Treprostinil, albuterol, aspirin EC, baclofen, digoxin, gabapentin, ipratropium-albuterol, levothyroxine, potassium chloride SA, sertraline, and  torsemide  History Review  Allergy: Ms. Tracey is allergic to codeine and meloxicam. Drug: [name]  reports no history of drug use. Alcohol:  reports no history of alcohol use. Tobacco:  reports that she has been smoking cigarettes. She has a 22.00 pack-year smoking history. She has never used  smokeless tobacco. Social: Ms. Kram  reports that she has been smoking cigarettes. She has a 22.00 pack-year smoking history. She has never used smokeless tobacco. She reports that she does not drink alcohol and does not use drugs. Medical:  has a past medical history of Acute postoperative pain (10/03/2017), Anxiety, BiPAP (biphasic positive airway pressure) dependence, Carpal tunnel syndrome, CHF (congestive heart failure) ([place]), CHF (congestive heart failure) ([place]), Chronic generalized abdominal pain, Chronic rhinitis, COPD (chronic obstructive pulmonary disease) ([place]), DDD (degenerative disc disease), cervical, DDD (degenerative disc disease), lumbosacral, Depression, Dyspnea, Edema, Flu, Gastritis, GERD (gastroesophageal reflux disease), Hematuria, Hernia of abdominal wall (07/17/2018), Hernia, abdominal, Hypertension, Kidney stones, Low back pain, Lumbar radiculitis, Malodorous urine, Muscle weakness, Obesity, Oxygen dependent, Renal cyst, Sensory urge incontinence, Thyroid activity decreased, Tobacco abuse, and Wheezing. Surgical: Ms. Deupree  has a past surgical history that includes Abdominal hysterectomy; Tonsillectomy; Carpal tunnel release (Left, 2012); Tubal ligation; epigastric hernia repair (N/A, 08/13/2018); Umbilical hernia repair (N/A, 08/13/2018); and RIGHT/LEFT HEART CATH AND CORONARY ANGIOGRAPHY (N/A, 07/11/2019). Family: family history includes Alcohol abuse in her father; Breast cancer in her sister; Cancer in her brother, brother, and sister; Coronary artery disease in her mother; Heart disease in her father and mother; Lung cancer in her sister; Pneumonia in her brother; Stroke in her mother.  Laboratory Chemistry Profile   Renal Lab Results  Component Value Date   BUN 18 06/26/2020   CREATININE 0.84 06/26/2020   BCR 16 03/04/2020   GFRAA >60 06/26/2020   GFRNONAA >60 06/26/2020     Hepatic Lab Results  Component Value Date   AST 21 06/25/2020   ALT 22 06/25/2020    ALBUMIN 3.9 06/25/2020   ALKPHOS 94 06/25/2020   LIPASE 31 10/17/2017   AMMONIA 40 (H) 10/11/2019     Electrolytes Lab Results  Component Value Date   NA 140 06/26/2020   K 4.0 06/26/2020   CL 101 06/26/2020   CALCIUM 9.2 06/26/2020   MG 2.7 (H) 02/10/2020   PHOS 4.0 02/10/2020     Bone Lab Results  Component Value Date   25OHVITD1 13 (L) 09/17/2015   25OHVITD2 <1.0 09/17/2015   25OHVITD3 13 09/17/2015     Inflammation (CRP: Acute Phase) (ESR: Chronic Phase) Lab Results  Component Value Date   CRP 4.2 (H) 02/08/2020   ESRSEDRATE 16 08/08/2017   LATICACIDVEN 1.4 02/03/2020       Note: Above Lab results reviewed.  Recent Imaging Review  CT Angio Chest PE W and/or Wo Contrast CLINICAL DATA:  Short of breath, upper back pain  EXAM: CT ANGIOGRAPHY CHEST WITH CONTRAST  TECHNIQUE: Multidetector CT imaging of the chest was performed using the standard protocol during bolus administration of intravenous contrast. Multiplanar CT image reconstructions and MIPs were obtained to evaluate the vascular anatomy.  CONTRAST:  32m OMNIPAQUE IOHEXOL 350 MG/ML SOLN  COMPARISON:  06/25/2020, 06/26/2020  FINDINGS: Cardiovascular: This is a technically adequate evaluation of the pulmonary vasculature. No filling defects or pulmonary emboli. Dilation of the main pulmonary arteries consistent with pulmonary arterial hypertension.  Mild cardiomegaly. No pericardial effusion. Extensive atherosclerosis of the coronary vasculature greatest in the LAD distribution. Mild atherosclerosis of the aortic arch.  Mediastinum/Nodes: No enlarged mediastinal, hilar, or axillary lymph nodes. Thyroid gland, trachea, and esophagus demonstrate no significant findings.  Lungs/Pleura: Upper lobe predominant emphysema. No acute airspace disease, effusion, or pneumothorax. Central airways are patent.  Upper Abdomen: Subcentimeter hepatic hypodensities likely reflects cysts. No acute upper  abdominal finding.  Musculoskeletal: No acute or destructive bony lesions. Reconstructed images demonstrate no additional findings.  Review of the MIP images confirms the above findings.  IMPRESSION: 1. No evidence of pulmonary embolus. 2. Dilation of the main pulmonary arteries consistent with pulmonary arterial hypertension. 3. Aortic Atherosclerosis (ICD10-I70.0) and Emphysema (ICD10-J43.9).  Electronically Signed   By: Randa Ngo M.D.   On: 06/26/2020 18:23 DG Chest 2 View CLINICAL DATA:  Shortness of breath.  Chest pain.  EXAM: CHEST - 2 VIEW  COMPARISON:  Chest x-ray 06/25/2020, 10/22/2019, 07/06/2019, 02/07/2018. CT chest 04/02/2020.  FINDINGS: Mediastinum and hilar structures normal. Cardiomegaly. Prominent central pulmonary arteries again noted suggesting pulmonary hypertension. Chronic interstitial changes again noted consistent chronic interstitial lung disease. Superimposed active mild interstitial process cannot be excluded. Mild bibasilar subsegmental atelectasis and or scarring again noted. No pleural effusion or pneumothorax. No acute bony abnormality.  IMPRESSION: 1. Cardiomegaly. Prominent central pulmonary arteries again noted suggesting pulmonary hypertension.  2. Chronic interstitial changes again noted consistent chronic interstitial lung disease. Superimposed active mild interstitial process including pneumonitis cannot be excluded. Mild bibasilar subsegmental atelectasis and or scarring again noted.  Electronically Signed   By: Marcello Moores  Register   On: 06/26/2020 11:45 Note: Reviewed        Physical Exam  General appearance: Well nourished, well developed, and well hydrated. In no apparent acute distress Mental status: Alert, oriented x 3 (person, place, & time)       Respiratory: No evidence of acute respiratory distress Eyes: PERLA Vitals: BP 134/80   Pulse 78   Temp (!) 97.3 F (36.3 C) (Temporal)   Resp 16   Ht _0  (1.626 m)    Wt 192 lb (87.1 kg)   SpO2 95% Comment: on 2.5L O2 from home  BMI 32.96 kg/m  BMI: Estimated body mass index is 32.96 kg/m as calculated from the following:   Height as of this encounter: _1  (1.626 m).   Weight as of this encounter: 192 lb (87.1 kg). Ideal: Ideal body weight: 54.7 kg (120 lb 9.5 oz) Adjusted ideal body weight: 67.7 kg (149 lb 2.5 oz)  Assessment   Status Diagnosis  Controlled Controlled Controlled 1. Chronic pain syndrome (significant psychosocial component)   2. Chronic low back pain (Primary Area of Pain) (Bilateral) (L>R)   3. Chronic lower extremity pain (Secondary Area of Pain) (Left)   4. Chronic hip pain (Tertiary Area of Pain) (Bilateral) (L>R)   5. Pharmacologic therapy   6. Left thigh pain   7. Neurogenic pain   8. Neuropathic pain      Updated Problems: No problems updated.  Plan of Care  Problem-specific:  No problem-specific Assessment & Plan notes found for this encounter.  Ms. Witney Huie has a current medication list which includes the following long-term medication(s): albuterol, baclofen, digoxin, gabapentin, [START ON 08/06/2020] hydrocodone-acetaminophen, [START ON 09/05/2020] hydrocodone-acetaminophen, [START ON 10/05/2020] hydrocodone-acetaminophen, ipratropium-albuterol, levothyroxine, potassium chloride sa, sertraline, and torsemide.  Pharmacotherapy (Medications Ordered): Meds ordered this encounter  Medications  . HYDROcodone-acetaminophen (NORCO/VICODIN) 5-325 MG tablet    Sig: Take 1 tablet by mouth every 8 (eight) hours as needed for severe pain. Must last 30 days    Dispense:  90  tablet    Refill:  0    Chronic Pain: STOP Act (Not applicable) Fill 1 day early if closed on refill date. Avoid benzodiazepines within 8 hours of opioids  . HYDROcodone-acetaminophen (NORCO/VICODIN) 5-325 MG tablet    Sig: Take 1 tablet by mouth every 8 (eight) hours as needed for severe pain. Must last 30 days    Dispense:  90 tablet     Refill:  0    Chronic Pain: STOP Act (Not applicable) Fill 1 day early if closed on refill date. Avoid benzodiazepines within 8 hours of opioids  . HYDROcodone-acetaminophen (NORCO/VICODIN) 5-325 MG tablet    Sig: Take 1 tablet by mouth every 8 (eight) hours as needed for severe pain. Must last 30 days    Dispense:  90 tablet    Refill:  0    Chronic Pain: STOP Act (Not applicable) Fill 1 day early if closed on refill date. Avoid benzodiazepines within 8 hours of opioids   Orders:  No orders of the defined types were placed in this encounter.  Follow-up plan:   Return in about 3 months (around 11/04/2020) for (F2F), (Med Mgmt).      Interventional management options: Planned, scheduled, and/or pending: NOTE: NO further Lumbar RFAuntil BMI <35. Diagnostic left L2-3 LESI #1 today   Considering: Palliative procedures    Palliative PRN treatment(s): Palliativebilaterallumbar facetblock #4 Diagnostic/palliative left L4TFESI #2 Diagnostic/palliative left L4-5 LESI #3 Diagnostic/palliativerightL4 TFESI #2 Diagnostic/palliativerightL4-5 LESI #2 Palliative leftlumbar facet RFA#2(done on 06/27/2017) Palliative right lumbar facet RFA#2(done on 10/03/2017)     Recent Visits Date Type Provider Dept  05/06/20 Office Visit Milinda Pointer, MD [facility]  Showing recent visits within past 90 days and meeting all other requirements Today's Visits Date Type Provider Dept  08/03/20 Office Visit Milinda Pointer, MD [facility]  Showing today's visits and meeting all other requirements Future Appointments Date Type Provider Dept  10/28/20 Appointment Milinda Pointer, MD [facility]  Showing future appointments within next 90 days and meeting all other requirements  I discussed the assessment and treatment plan with the patient. The patient was provided an opportunity to ask questions and all were answered. The patient agreed with  the plan and demonstrated an understanding of the instructions.  Patient advised to call back or seek an in-person evaluation if the symptoms or condition worsens.  Duration of encounter: 30 minutes.  Note by: Gaspar Cola, MD Date: 08/03/2020; Time: 11:28 AM

## 2020-08-03 ENCOUNTER — Ambulatory Visit: Payer: Medicare HMO | Attending: Pain Medicine | Admitting: Pain Medicine

## 2020-08-03 ENCOUNTER — Other Ambulatory Visit: Payer: Self-pay

## 2020-08-03 ENCOUNTER — Encounter: Payer: Self-pay | Admitting: Pain Medicine

## 2020-08-03 VITALS — BP 134/80 | HR 78 | Temp 97.3°F | Resp 16 | Ht 64.0 in | Wt 192.0 lb

## 2020-08-03 DIAGNOSIS — M79652 Pain in left thigh: Secondary | ICD-10-CM | POA: Diagnosis not present

## 2020-08-03 DIAGNOSIS — M79605 Pain in left leg: Secondary | ICD-10-CM | POA: Insufficient documentation

## 2020-08-03 DIAGNOSIS — G894 Chronic pain syndrome: Secondary | ICD-10-CM

## 2020-08-03 DIAGNOSIS — M545 Low back pain, unspecified: Secondary | ICD-10-CM | POA: Diagnosis not present

## 2020-08-03 DIAGNOSIS — Z79899 Other long term (current) drug therapy: Secondary | ICD-10-CM | POA: Diagnosis not present

## 2020-08-03 DIAGNOSIS — M25552 Pain in left hip: Secondary | ICD-10-CM

## 2020-08-03 DIAGNOSIS — M792 Neuralgia and neuritis, unspecified: Secondary | ICD-10-CM | POA: Diagnosis not present

## 2020-08-03 DIAGNOSIS — G8929 Other chronic pain: Secondary | ICD-10-CM | POA: Insufficient documentation

## 2020-08-03 DIAGNOSIS — M25551 Pain in right hip: Secondary | ICD-10-CM | POA: Diagnosis not present

## 2020-08-03 MED ORDER — HYDROCODONE-ACETAMINOPHEN 5-325 MG PO TABS: 1.0000 | ORAL_TABLET | Freq: Three times a day (TID) | ORAL | 0 refills | Status: DC | PRN

## 2020-08-03 NOTE — Patient Instructions (Addendum)
____________________________________________________________________________________________  Medication Rules  Purpose: To inform patients, and their family members, of our rules and regulations.  Applies to: All patients receiving prescriptions (written or electronic).  Pharmacy of record: Pharmacy where electronic prescriptions will be sent. If written prescriptions are taken to a different pharmacy, please inform the nursing staff. The pharmacy listed in the electronic medical record should be the one where you would like electronic prescriptions to be sent.  Electronic prescriptions: In compliance with the [place] (STOP) Act of 2017 (Session Lanny Cramp [phone]), effective October 17, 2018, all controlled substances must be electronically prescribed. Calling prescriptions to the pharmacy will cease to exist.  Prescription refills: Only during scheduled appointments. Applies to all prescriptions.  NOTE: The following applies primarily to controlled substances (Opioid* Pain Medications).   Type of encounter (visit): For patients receiving controlled substances, face-to-face visits are required. (Not an option or up to the patient.)  Patient's responsibilities: 1. Pain Pills: Bring all pain pills to every appointment (except for procedure appointments). 2. Pill Bottles: Bring pills in original pharmacy bottle. Always bring the newest bottle. Bring bottle, even if empty. 3. Medication refills: You are responsible for knowing and keeping track of what medications you take and those you need refilled. The day before your appointment: write a list of all prescriptions that need to be refilled. The day of the appointment: give the list to the admitting nurse. Prescriptions will be written only during appointments. No prescriptions will be written on procedure days. If you forget a medication: it will not be "Called in", "Faxed", or "electronically sent".  You will need to get another appointment to get these prescribed. No early refills. Do not call asking to have your prescription filled early. 4. Prescription Accuracy: You are responsible for carefully inspecting your prescriptions before leaving our office. Have the discharge nurse carefully go over each prescription with you, before taking them home. Make sure that your name is accurately spelled, that your address is correct. Check the name and dose of your medication to make sure it is accurate. Check the number of pills, and the written instructions to make sure they are clear and accurate. Make sure that you are given enough medication to last until your next medication refill appointment. 5. Taking Medication: Take medication as prescribed. When it comes to controlled substances, taking less pills or less frequently than prescribed is permitted and encouraged. Never take more pills than instructed. Never take medication more frequently than prescribed.  6. Inform other Doctors: Always inform, all of your healthcare providers, of all the medications you take. 7. Pain Medication from other Providers: You are not allowed to accept any additional pain medication from any other Doctor or Healthcare provider. There are two exceptions to this rule. (see below) In the event that you require additional pain medication, you are responsible for notifying us, as stated below. 8. Medication Agreement: You are responsible for carefully reading and following our Medication Agreement. This must be signed before receiving any prescriptions from our practice. Safely store a copy of your signed Agreement. Violations to the Agreement will result in no further prescriptions. (Additional copies of our Medication Agreement are available upon request.) 9. Laws, Rules, & Regulations: All patients are expected to follow all Federal and [employer], [employer], Rules, [employer]. Ignorance of the Laws does not constitute a  valid excuse.  10. Illegal drugs and Controlled Substances: The use of illegal substances (including, but not limited to marijuana and its  derivatives) and/or the illegal use of any controlled substances is strictly prohibited. Violation of this rule may result in the immediate and permanent discontinuation of any and all prescriptions being written by our practice. The use of any illegal substances is prohibited. 11. Adopted CDC guidelines & recommendations: Target dosing levels will be at or below 60 MME/day. Use of benzodiazepines** is not recommended.  Exceptions: There are only two exceptions to the rule of not receiving pain medications from other Healthcare Providers. 1. Exception #1 (Emergencies): In the event of an emergency (i.e.: accident requiring emergency care), you are allowed to receive additional pain medication. However, you are responsible for: As soon as you are able, call our office [phone], at any time of the day or night, and leave a message stating your name, the date and nature of the emergency, and the name and dose of the medication prescribed. In the event that your call is answered by a member of our staff, make sure to document and save the date, time, and the name of the person that took your information.  2. Exception #2 (Planned Surgery): In the event that you are scheduled by another doctor or dentist to have any type of surgery or procedure, you are allowed (for a period no longer than 30 days), to receive additional pain medication, for the acute post-op pain. However, in this case, you are responsible for picking up a copy of our "Post-op Pain Management for Surgeons" handout, and giving it to your surgeon or dentist. This document is available at our office, and does not require an appointment to obtain it. Simply go to our office during business hours (Monday-Thursday from 8:00 AM to 4:00 PM) (Friday 8:00 AM to 12:00 Noon) or if you have a scheduled appointment  with [place], prior to your surgery, and ask for it by name. In addition, you are responsible for: calling our office [phone], at any time of the day or night, and leaving a message stating your name, name of your surgeon, type of surgery, and date of procedure or surgery. Failure to comply with your responsibilities may result in termination of therapy involving the controlled substances.  *Opioid medications include: morphine, codeine, oxycodone, oxymorphone, hydrocodone, hydromorphone, meperidine, tramadol, tapentadol, buprenorphine, fentanyl, methadone. **Benzodiazepine medications include: diazepam (Valium), alprazolam (Xanax), clonazepam (Klonopine), lorazepam (Ativan), clorazepate (Tranxene), chlordiazepoxide (Librium), estazolam (Prosom), oxazepam (Serax), temazepam (Restoril), triazolam (Halcion) (Last updated: 06/23/2020) ____________________________________________________________________________________________   ____________________________________________________________________________________________  Medication Recommendations and Reminders  Applies to: All patients receiving prescriptions (written and/or electronic).  Medication Rules & Regulations: These rules and regulations exist for your safety and that of others. They are not flexible and neither are we. Dismissing or ignoring them will be considered "non-compliance" with medication therapy, resulting in complete and irreversible termination of such therapy. (See document titled "Medication Rules" for more details.) In all conscience, because of safety reasons, we cannot continue providing a therapy where the patient does not follow instructions.  Pharmacy of record:   Definition: This is the pharmacy where your electronic prescriptions will be sent.   We do not endorse any particular pharmacy, however, we have experienced problems with [facility] not securing enough medication supply for the community.  We do not  restrict you in your choice of pharmacy. However, once we write for your prescriptions, we will NOT be re-sending more prescriptions to fix restricted supply problems created by your pharmacy, or your insurance.   The pharmacy listed in the electronic medical record should be the  one where you want electronic prescriptions to be sent.  If you choose to change pharmacy, simply notify our nursing staff.  Recommendations:  Keep all of your pain medications in a safe place, under lock and key, even if you live alone. We will NOT replace lost, stolen, or damaged medication.  After you fill your prescription, take 1 week's worth of pills and put them away in a safe place. You should keep a separate, properly labeled bottle for this purpose. The remainder should be kept in the original bottle. Use this as your primary supply, until it runs out. Once it's gone, then you know that you have 1 week's worth of medicine, and it is time to come in for a prescription refill. If you do this correctly, it is unlikely that you will ever run out of medicine.  To make sure that the above recommendation works, it is very important that you make sure your medication refill appointments are scheduled at least 1 week before you run out of medicine. To do this in an effective manner, make sure that you do not leave the office without scheduling your next medication management appointment. Always ask the nursing staff to show you in your prescription , when your medication will be running out. Then arrange for the receptionist to get you a return appointment, at least 7 days before you run out of medicine. Do not wait until you have 1 or 2 pills left, to come in. This is very poor planning and does not take into consideration that we may need to cancel appointments due to bad weather, sickness, or emergencies affecting our staff.  DO NOT ACCEPT A "Partial Fill": If for any reason your pharmacy does not have enough pills/tablets  to completely fill or refill your prescription, do not allow for a "partial fill". The law allows the pharmacy to complete that prescription within 72 hours, without requiring a new prescription. If they do not fill the rest of your prescription within those 72 hours, you will need a separate prescription to fill the remaining amount, which we will NOT provide. If the reason for the partial fill is your insurance, you will need to talk to the pharmacist about payment alternatives for the remaining tablets, but again, DO NOT ACCEPT A PARTIAL FILL, unless you can trust your pharmacist to obtain the remainder of the pills within 72 hours.  Prescription refills and/or changes in medication(s):   Prescription refills, and/or changes in dose or medication, will be conducted only during scheduled medication management appointments. (Applies to both, written and electronic prescriptions.)  No refills on procedure days. No medication will be changed or started on procedure days. No changes, adjustments, and/or refills will be conducted on a procedure day. Doing so will interfere with the diagnostic portion of the procedure.  No phone refills. No medications will be "called into the pharmacy".  No Fax refills.  No weekend refills.  No Holliday refills.  No after hours refills.  Remember:  Business hours are:  Monday to Thursday 8:00 AM to 4:00 PM Provider's Schedule: Milinda Pointer, MD - Appointments are:  Medication management: Monday and Wednesday 8:00 AM to 4:00 PM Procedure day: Tuesday and Thursday 7:30 AM to 4:00 PM Gillis Santa, MD - Appointments are:  Medication management: Tuesday and Thursday 8:00 AM to 4:00 PM Procedure day: Monday and Wednesday 7:30 AM to 4:00 PM (Last update: 05/06/2020) ____________________________________________________________________________________________   ____________________________________________________________________________________________  CBD  (cannabidiol) WARNING  Applicable to: All individuals currently  taking or considering taking CBD (cannabidiol) and, more important, all patients taking opioid analgesic controlled substances (pain medication). (Example: oxycodone; oxymorphone; hydrocodone; hydromorphone; morphine; methadone; tramadol; tapentadol; fentanyl; buprenorphine; butorphanol; dextromethorphan; meperidine; codeine; etc.)  Legal status: CBD remains a Schedule I drug prohibited for any use. CBD is illegal with one exception. In the [place], CBD has a limited Transport planner (FDA) approval for the treatment of two specific types of epilepsy disorders. Only one CBD product has been approved by the FDA for this purpose: "Epidiolex". FDA is aware that some companies are marketing products containing cannabis and cannabis-derived compounds in ways that violate the [employer], Drug and Cosmetic Act [facility] Act) and that may put the health and safety of consumers at risk. The FDA, a Federal agency, has not enforced the CBD status since 2018.   Legality: Some manufacturers ship CBD products nationally, which is illegal. Often such products are sold online and are therefore available throughout the country. CBD is openly sold in head shops and health food stores in some states where such sales have not been explicitly legalized. Selling unapproved products with unsubstantiated therapeutic claims is not only a violation of the law, but also can put patients at risk, as these products have not been proven to be safe or effective. Federal illegality makes it difficult to conduct research on CBD.  Reference: "FDA Regulation of Cannabis and Cannabis-Derived Products, Including Cannabidiol (CBD)" - [employer]  Warning: CBD is not FDA approved and has not undergo the same manufacturing controls as prescription  drugs.  This means that the purity and safety of available CBD may be questionable. Most of the time, despite manufacturer's claims, it is contaminated with THC (delta-9-tetrahydrocannabinol - the chemical in marijuana responsible for the "HIGH").  When this is the case, the [facility] contaminant will trigger a positive urine drug screen (UDS) test for Marijuana (carboxy-THC). Because a positive UDS for any illicit substance is a violation of our medication agreement, your opioid analgesics (pain medicine) may be permanently discontinued.  MORE ABOUT CBD  General Information: CBD  is a derivative of the Marijuana (cannabis sativa) plant discovered in 18. It is one of the 113 identified substances found in Marijuana. It accounts for up to 40% of the plant's extract. As of 2018, preliminary clinical studies on CBD included research for the treatment of anxiety, movement disorders, and pain. CBD is available and consumed in multiple forms, including inhalation of smoke or vapor, as an aerosol spray, and by mouth. It may be supplied as an oil containing CBD, capsules, dried cannabis, or as a liquid solution. CBD is thought not to be as psychoactive as THC (delta-9-tetrahydrocannabinol - the chemical in marijuana responsible for the "HIGH"). Studies suggest that CBD may interact with different biological target receptors in the body, including cannabinoid and other neurotransmitter receptors. As of 2018 the mechanism of action for its biological effects has not been determined.  Side-effects  Adverse reactions: Dry mouth, diarrhea, decreased appetite, fatigue, drowsiness, malaise, weakness, sleep disturbances, and others.  Drug interactions: CBC may interact with other medications such as blood-thinners. (Last update:  05/23/2020) ____________________________________________________________________________________________   ____________________________________________________________________________________________  Drug Holidays ([place])  What is a "Drug Holiday"? Drug Holiday: is the name given to the period of time during which a patient stops taking a medication(s) for the purpose of eliminating tolerance to the drug.  Benefits . Improved effectiveness of opioids. . Decreased opioid dose needed to achieve benefits. . Improved pain with lesser dose.  What is tolerance? Tolerance: is the progressive decreased in effectiveness of a drug due to its repetitive use. With repetitive use, the body gets use to the medication and as a consequence, it loses its effectiveness. This is a common problem seen with opioid pain medications. As a result, a larger dose of the drug is needed to achieve the same effect that used to be obtained with a smaller dose.  How long should a "Drug Holiday" last? You should stay off of the pain medicine for at least 14 consecutive days. (2 weeks)  Should I stop the medicine "cold [place]"? No. You should always coordinate with your Pain Specialist so that he/she can provide you with the correct medication dose to make the transition as smoothly as possible.  How do I stop the medicine? Slowly. You will be instructed to decrease the daily amount of pills that you take by one (1) pill every seven (7) days. This is called a "slow downward taper" of your dose. For example: if you normally take four (4) pills per day, you will be asked to drop this dose to three (3) pills per day for seven (7) days, then to two (2) pills per day for seven (7) days, then to one (1) per day for seven (7) days, and at the end of those last seven (7) days, this is when the "Drug Holiday" would start.   Will I have withdrawals? By doing a "slow downward taper" like this one, it is unlikely that you will  experience any significant withdrawal symptoms. Typically, what triggers withdrawals is the sudden stop of a high dose opioid therapy. Withdrawals can usually be avoided by slowly decreasing the dose over a prolonged period of time. If you do not follow these instructions and decide to stop your medication abruptly, withdrawals may be possible.  What are withdrawals? Withdrawals: refers to the wide range of symptoms that occur after stopping or dramatically reducing opiate drugs after heavy and prolonged use. Withdrawal symptoms do not occur to patients that use low dose opioids, or those who take the medication sporadically. Contrary to benzodiazepine (example: Valium, Xanax, etc.) or alcohol withdrawals ("Delirium Tremens"), opioid withdrawals are not lethal. Withdrawals are the physical manifestation of the body getting rid of the excess receptors.  Expected Symptoms Early symptoms of withdrawal may include: . Agitation . Anxiety . Muscle aches . Increased tearing . Insomnia . Runny nose . Sweating . Yawning  Late symptoms of withdrawal may include: . Abdominal cramping . Diarrhea . Dilated pupils . Goose bumps . Nausea . Vomiting  Will I experience withdrawals? Due to the slow nature of the taper, it is very unlikely that you will experience any.  What is a slow taper? Taper: refers to the gradual decrease in dose.  (Last update: 05/06/2020) ____________________________________________________________________________________________    Hydrocodone/APAP to last until 11/04/2020 has been escribed to your pharmacy.

## 2020-08-03 NOTE — Progress Notes (Signed)
Nursing Pain Medication Assessment:  Safety precautions to be maintained throughout the outpatient stay will include: orient to surroundings, keep bed in low position, maintain call bell within reach at all times, provide assistance with transfer out of bed and ambulation.  Medication Inspection Compliance: Pill count conducted under aseptic conditions, in front of the patient. Neither the pills nor the bottle was removed from the patient's sight at any time. Once count was completed pills were immediately returned to the patient in their original bottle.  Medication: Hydrocodone/APAP Pill/Patch Count: 15 of 90 pills remain Pill/Patch Appearance: Markings consistent with prescribed medication Bottle Appearance: Standard pharmacy container. Clearly labeled. Filled Date: 20 / 23 / 2021 Last Medication intake:  Today

## 2020-08-08 DIAGNOSIS — J449 Chronic obstructive pulmonary disease, unspecified: Secondary | ICD-10-CM | POA: Diagnosis not present

## 2020-08-20 IMAGING — US US EXTREM LOW VENOUS*R*
1 series · 13 of 24 positions shown · non-contrast
Comparison: None.

CLINICAL DATA: Right lower extremity pain and edema. History of
smoking. Evaluate for DVT.



[Series 1: us extrem low venous*right* · 0.08mm/px · 13 of 37 slices shown]
[im 1/37]
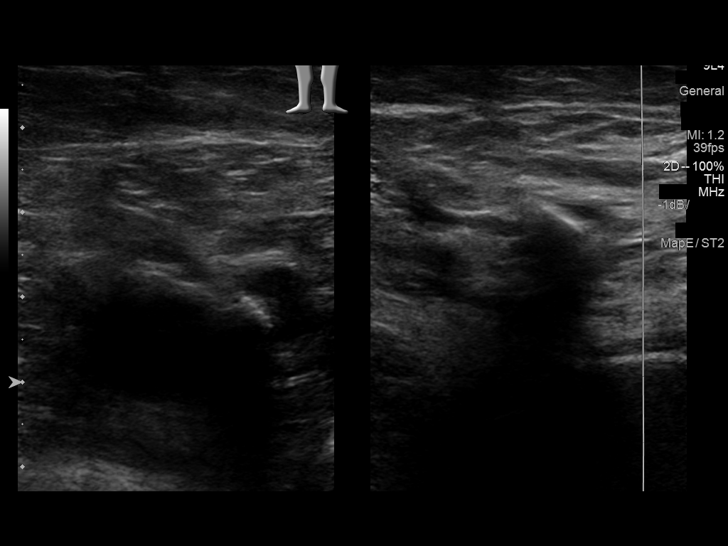
[im 4/37]
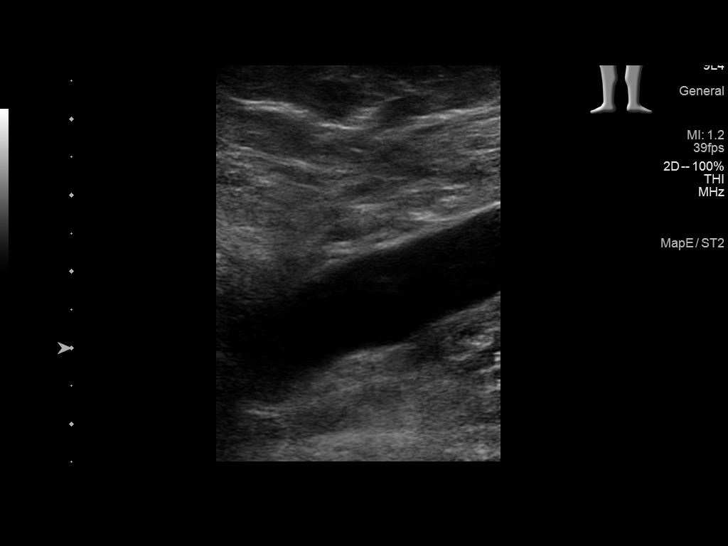
[im 7/37]
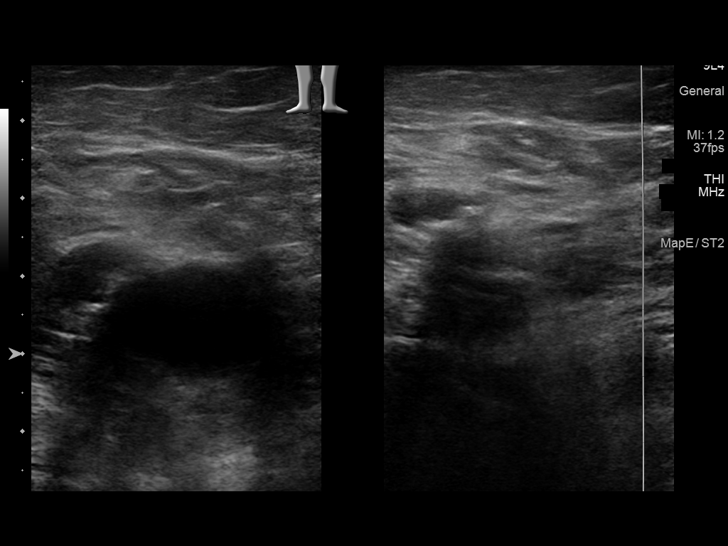
[im 10/37]
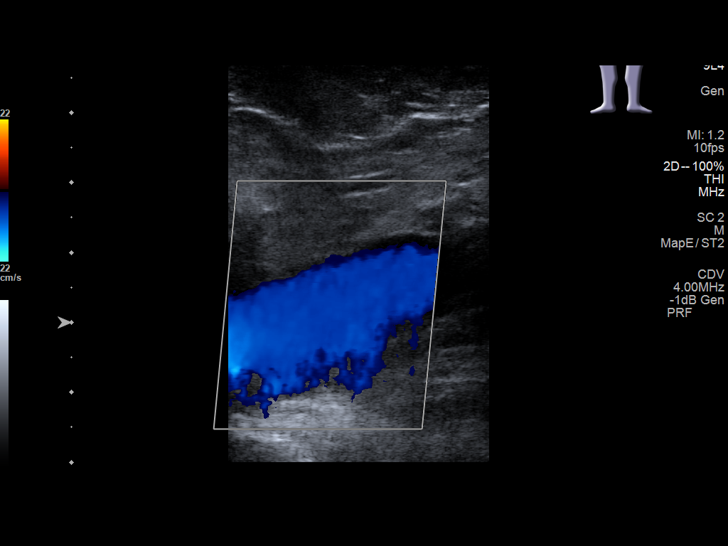
[im 13/37]
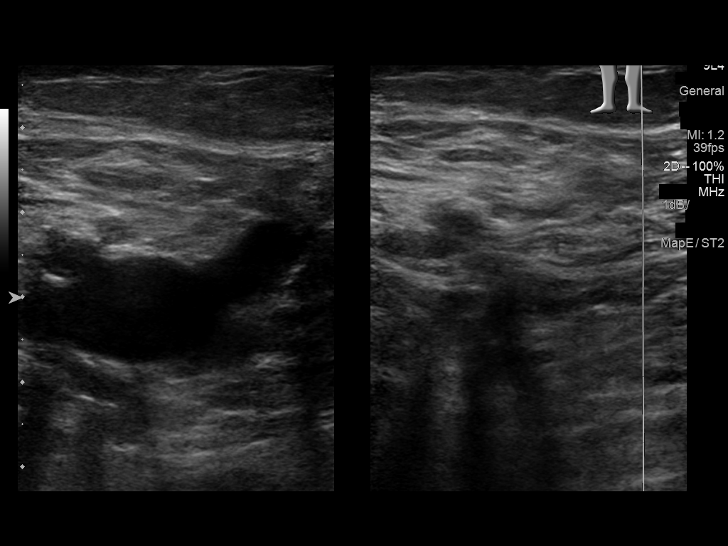
[im 16/37]
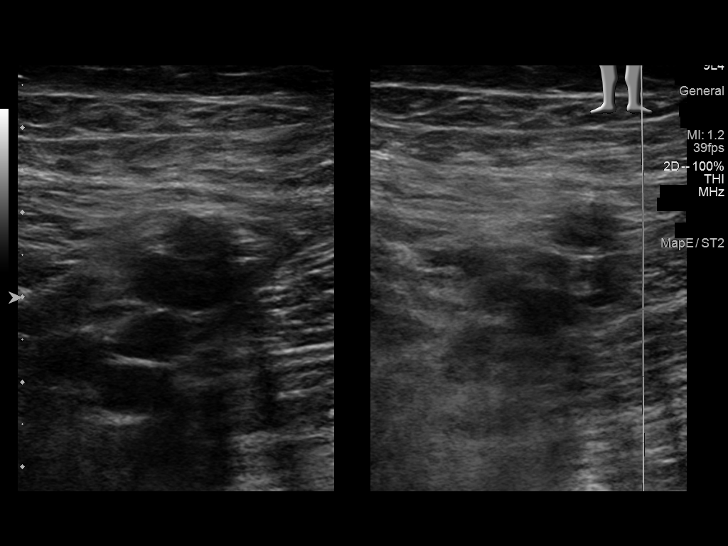
[im 19/37]
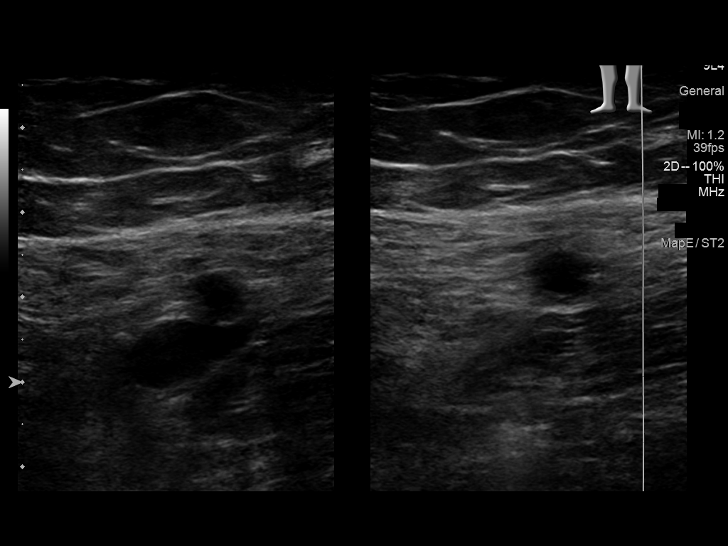
[im 21/37]
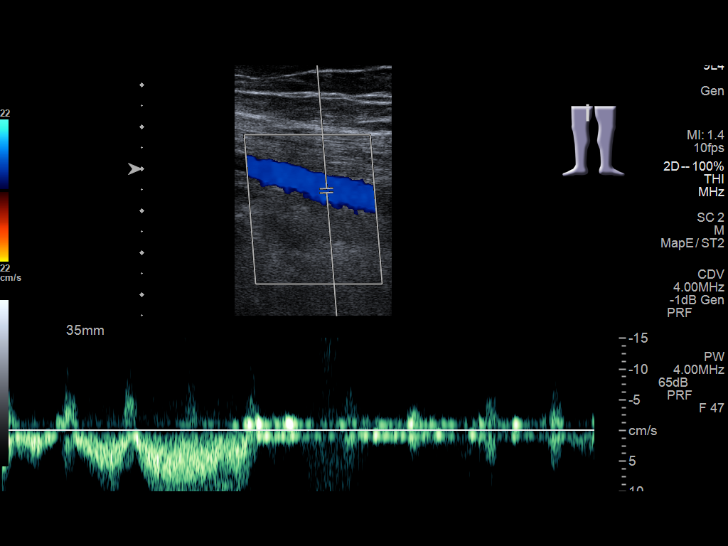
[im 24/37]
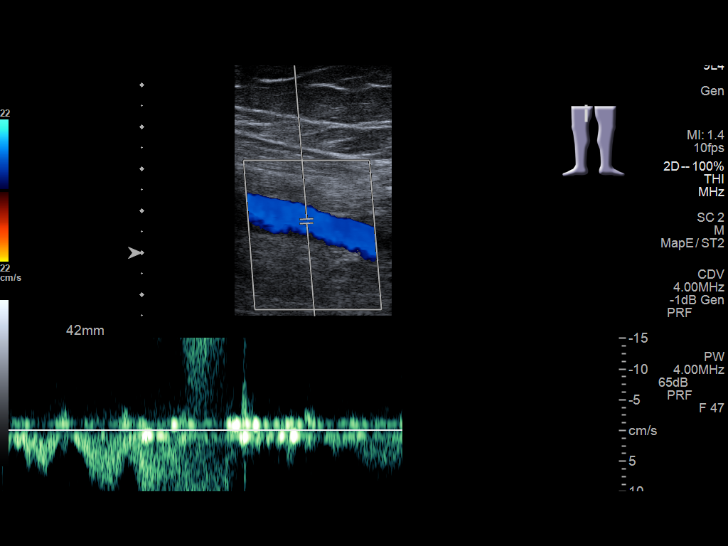
[im 27/37]
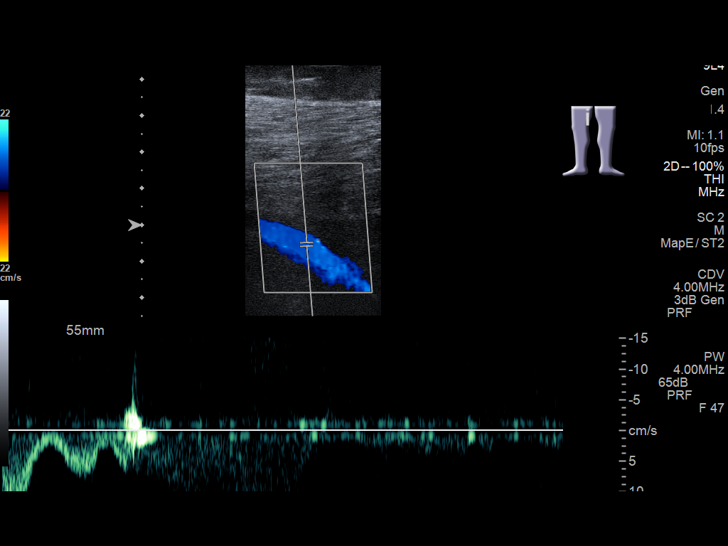
[im 30/37]
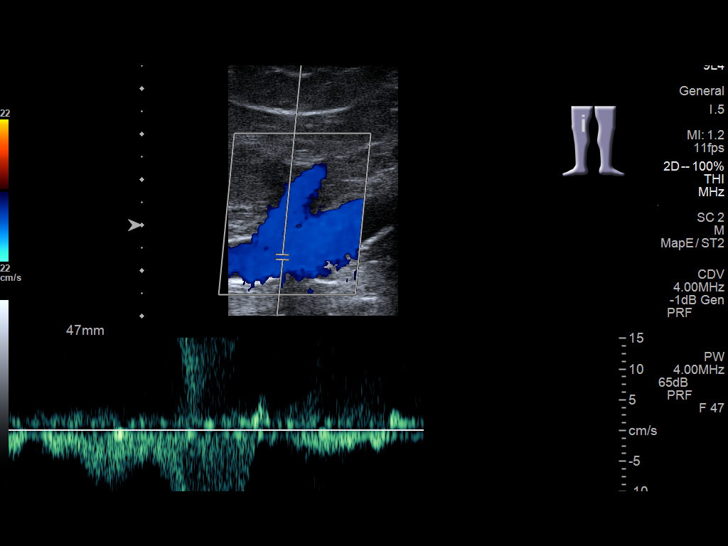
[im 33/37]
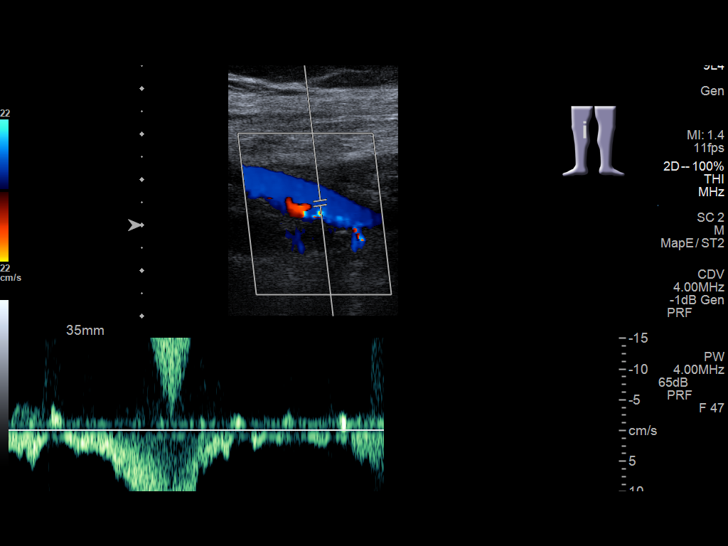
[im 37/37]
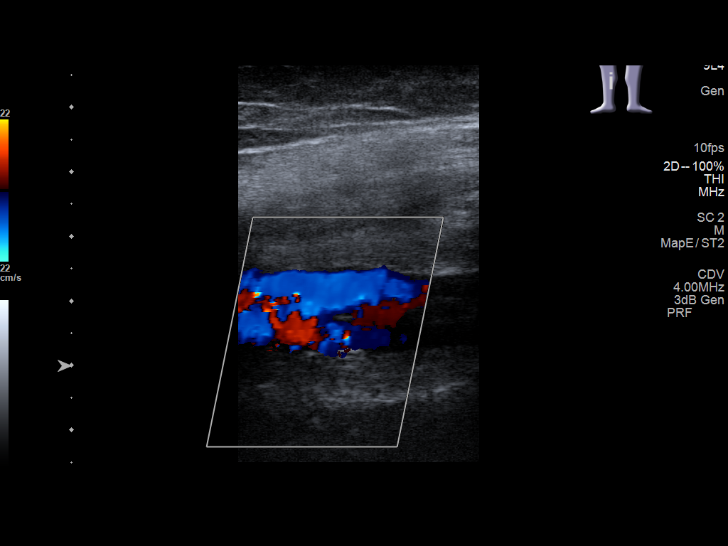

[13 of 24 positions shown; findings below may reference images not displayed]

FINDINGS: Contralateral Common Femoral Vein: Respiratory phasicity is normal
and symmetric with the symptomatic side. No evidence of thrombus.
Normal compressibility.

Common Femoral Vein: No evidence of thrombus. Normal
compressibility, respiratory phasicity and response to augmentation.

Saphenofemoral Junction: No evidence of thrombus. Normal
compressibility and flow on color Doppler imaging.

Profunda Femoral Vein: No evidence of thrombus. Normal
compressibility and flow on color Doppler imaging.

Femoral Vein: No evidence of thrombus. Normal compressibility,
respiratory phasicity and response to augmentation.

Popliteal Vein: No evidence of thrombus. Normal compressibility,
respiratory phasicity and response to augmentation.

Calf Veins: No evidence of thrombus. Normal compressibility and flow
on color Doppler imaging.

Superficial Great Saphenous Vein: No evidence of thrombus. Normal
compressibility.

Venous Reflux:  None.

Other Findings:  None.
IMPRESSION: No evidence of DVT within the right lower extremity.

## 2020-08-22 DIAGNOSIS — J9610 Chronic respiratory failure, unspecified whether with hypoxia or hypercapnia: Secondary | ICD-10-CM | POA: Diagnosis not present

## 2020-08-28 ENCOUNTER — Other Ambulatory Visit: Payer: Self-pay | Admitting: Family Medicine

## 2020-08-28 NOTE — Telephone Encounter (Signed)
L.O.V. was on 03/23/2020 and no upcoming visit.

## 2020-08-28 NOTE — Telephone Encounter (Signed)
Requested medication (s) are due for refill today: yes  Requested medication (s) are on the active medication list: yes  Last refill:  05/28/20 #90 0 refills  Future visit scheduled: no  Notes to clinic:  last labs 10/11/2019 in hospital . Do you want to refill?     Requested Prescriptions  Pending Prescriptions Disp Refills   digoxin (LANOXIN) 0.25 MG tablet [Pharmacy Med Name: DIGOXIN 0.25MG TABLETS (WHITE)] 90 tablet 0    Sig: TAKE 1 TABLET(0.25 MG) BY MOUTH DAILY      Cardiovascular:  Antiarrhythmic Agents - digoxin Failed - 08/28/2020  1:47 PM      Failed - Digoxin (serum) in normal range and within 180 days    Digoxin Level  Date Value Ref Range Status  10/11/2019 1.5 0.8 - 2.0 ng/mL Final    Comment:    Performed at [facility], [place] [address] [place], [place] [address]          Passed - Cr in normal range and within 180 days    Creatinine  Date Value Ref Range Status  02/02/2015 0.70 mg/dL Final    Comment:    0.44-1.00 NOTE: New Reference Range  12/23/14    Creatinine, Ser  Date Value Ref Range Status  06/26/2020 0.84 0.44 - 1.00 mg/dL Final          Passed - Ca in normal range and within 180 days    Calcium  Date Value Ref Range Status  06/26/2020 9.2 8.9 - 10.3 mg/dL Final   Calcium, Total  Date Value Ref Range Status  02/02/2015 8.7 (L) mg/dL Final    Comment:    8.9-10.3 NOTE: New Reference Range  12/23/14    Calcium, Ion  Date Value Ref Range Status  10/11/2019 1.16 1.15 - 1.40 mmol/L Final          Passed - K in normal range and within 180 days    Potassium  Date Value Ref Range Status  06/26/2020 4.0 3.5 - 5.1 mmol/L Final  02/02/2015 3.9 mmol/L Final    Comment:    3.5-5.1 NOTE: New Reference Range  12/23/14           Passed - Last Heart Rate in normal range    Pulse Readings from Last 1 Encounters:  08/03/20 78          Passed - Valid encounter within last 6 months    Recent Outpatient Visits           1  month ago Need for influenza vaccination   [place], Webb E, PA   2 months ago Chronic obstructive pulmonary disease with acute exacerbation ([place])   [place], [place], PA-C   5 months ago Essential (primary) hypertension   [facility], Vickki Muff, [place]   6 months ago Weakness   [facility], [place], [place]   8 months ago Essential (primary) hypertension   [facility], [place], [place]

## 2020-08-31 DIAGNOSIS — J439 Emphysema, unspecified: Secondary | ICD-10-CM | POA: Diagnosis not present

## 2020-08-31 DIAGNOSIS — I272 Pulmonary hypertension, unspecified: Secondary | ICD-10-CM | POA: Diagnosis not present

## 2020-08-31 DIAGNOSIS — G4700 Insomnia, unspecified: Secondary | ICD-10-CM | POA: Diagnosis not present

## 2020-08-31 DIAGNOSIS — R0602 Shortness of breath: Secondary | ICD-10-CM | POA: Diagnosis not present

## 2020-08-31 DIAGNOSIS — Z9981 Dependence on supplemental oxygen: Secondary | ICD-10-CM | POA: Diagnosis not present

## 2020-08-31 DIAGNOSIS — F17210 Nicotine dependence, cigarettes, uncomplicated: Secondary | ICD-10-CM | POA: Diagnosis not present

## 2020-09-03 ENCOUNTER — Telehealth: Payer: Self-pay

## 2020-09-03 ENCOUNTER — Telehealth: Payer: Medicare HMO

## 2020-09-03 DIAGNOSIS — R0602 Shortness of breath: Secondary | ICD-10-CM | POA: Diagnosis not present

## 2020-09-03 DIAGNOSIS — I272 Pulmonary hypertension, unspecified: Secondary | ICD-10-CM | POA: Diagnosis not present

## 2020-09-03 NOTE — Telephone Encounter (Signed)
  Chronic Care Management   Outreach Note  09/03/2020 Name: Theresa [name] MRN: [id number] DOB: 1955-12-29  Primary Care Provider: Margo Common, PA Reason for referral : Chronic Care Management   An unsuccessful telephone outreach was attempted today. [name] is currently engaged with the chronic care management team.     Follow Up Plan:  A HIPAA compliant voice message was left today requesting a return call.    [facility] [phone]

## 2020-09-08 DIAGNOSIS — J449 Chronic obstructive pulmonary disease, unspecified: Secondary | ICD-10-CM | POA: Diagnosis not present

## 2020-09-15 DIAGNOSIS — Z20822 Contact with and (suspected) exposure to covid-19: Secondary | ICD-10-CM | POA: Diagnosis not present

## 2020-09-18 ENCOUNTER — Telehealth (INDEPENDENT_AMBULATORY_CARE_PROVIDER_SITE_OTHER): Payer: Medicare HMO | Admitting: Family Medicine

## 2020-09-18 ENCOUNTER — Other Ambulatory Visit: Payer: Self-pay

## 2020-09-18 ENCOUNTER — Encounter: Payer: Self-pay | Admitting: Family Medicine

## 2020-09-18 DIAGNOSIS — J029 Acute pharyngitis, unspecified: Secondary | ICD-10-CM | POA: Diagnosis not present

## 2020-09-18 DIAGNOSIS — J441 Chronic obstructive pulmonary disease with (acute) exacerbation: Secondary | ICD-10-CM | POA: Diagnosis not present

## 2020-09-18 MED ORDER — DOXYCYCLINE HYCLATE 100 MG PO TABS: 100.0000 mg | ORAL_TABLET | Freq: Two times a day (BID) | ORAL | 0 refills | Status: DC

## 2020-09-18 MED ORDER — PREDNISONE 10 MG PO TABS: ORAL_TABLET | ORAL | 0 refills | Status: DC

## 2020-09-18 NOTE — Progress Notes (Signed)
Virtual telephone visit    Virtual Visit via Telephone Note   This visit type was conducted due to national recommendations for restrictions regarding the COVID-19 Pandemic (e.g. social distancing) in an effort to limit this patient's exposure and mitigate transmission in our community. Due to her co-morbid illnesses, this patient is at least at moderate risk for complications without adequate follow up. This format is felt to be most appropriate for this patient at this time. The patient did not have access to video technology or had technical difficulties with video requiring transitioning to audio format only (telephone). Physical exam was limited to content and character of the telephone converstion.    Patient location: Home Provider location: Office  I discussed the limitations of evaluation and management by telemedicine and the availability of in person appointments. The patient expressed understanding and agreed to proceed.   Visit Date: 09/18/2020  Today's healthcare provider: [name], PA   No chief complaint on file.  Subjective    HPI  This 64 year old female with history of CHF, COPD with hypoxia on 2-3 LPM oxygen and history of pneumonia due to COVID-19 virus (April 2021) developed sore throat, body aches, some cough with sputum production but no fever. Was exposed to someone having a positive COVID tes 09-13-20. She was tested for COVID on 09-15-20 and reported as negative yesterday. Has tried all her inhalers prescribed by Dr. Raul Del (pulmonologist) but having only mild improvement.    Past Medical History:  Diagnosis Date  . Acute postoperative pain 10/03/2017  . Anxiety   . BiPAP (biphasic positive airway pressure) dependence    at hs  . Carpal tunnel syndrome   . CHF (congestive heart failure) ([place])    1/18  . CHF (congestive heart failure) ([place])   . Chronic generalized abdominal pain   . Chronic rhinitis   . COPD (chronic obstructive pulmonary  disease) ([place])   . DDD (degenerative disc disease), cervical   . DDD (degenerative disc disease), lumbosacral   . Depression   . Dyspnea   . Edema   . Flu    1/18  . Gastritis   . GERD (gastroesophageal reflux disease)   . Hematuria   . Hernia of abdominal wall 07/17/2018  . Hernia, abdominal   . Hypertension   . Kidney stones   . Low back pain   . Lumbar radiculitis   . Malodorous urine   . Muscle weakness   . Obesity   . Oxygen dependent   . Renal cyst   . Sensory urge incontinence   . Thyroid activity decreased    9/19  . Tobacco abuse   . Wheezing    Past Surgical History:  Procedure Laterality Date  . ABDOMINAL HYSTERECTOMY    . CARPAL TUNNEL RELEASE Left 2012  . EPIGASTRIC HERNIA REPAIR N/A 08/13/2018   HERNIA REPAIR EPIGASTRIC ADULT; polypropylene mesh reinforcement.  Surgeon: Robert Bellow, MD;  Location: [facility];  Service: General;  Laterality: N/A;  . RIGHT/LEFT HEART CATH AND CORONARY ANGIOGRAPHY N/A 07/11/2019   Procedure: RIGHT/LEFT HEART CATH AND CORONARY ANGIOGRAPHY;  Surgeon: Yolonda Kida, MD;  Location: [place] [facility];  Service: Cardiovascular;  Laterality: N/A;  . TONSILLECTOMY    . TUBAL LIGATION    . UMBILICAL HERNIA REPAIR N/A 08/13/2018   HERNIA REPAIR UMBILICAL ADULT; polypropylene mesh reinforcement Surgeon: Robert Bellow, MD;  Location: [facility];  Service: General;  Laterality: N/A;   Social History   Tobacco  Use  . Smoking status: Current Every Day Smoker    Packs/day: 0.50    Years: 44.00    Pack years: 22.00    Types: Cigarettes  . Smokeless tobacco: Never Used  . Tobacco comment: over a half pack  Vaping Use  . Vaping Use: Never used  Substance Use Topics  . Alcohol use: No  . Drug use: No   Family History  Problem Relation Age of Onset  . Heart disease Mother   . Stroke Mother   . Coronary artery disease Mother   . Lung cancer Sister   . Cancer Sister   . Breast cancer Sister   . Alcohol abuse Father    . Heart disease Father   . Cancer Brother   . Cancer Brother   . Pneumonia Brother   . Prostate cancer Neg Hx   . Kidney cancer Neg Hx   . Bladder Cancer Neg Hx    Allergies  Allergen Reactions  . Codeine Nausea And Vomiting  . Meloxicam Other (See Comments)    Stomach pain      Medications: Outpatient Medications Prior to Visit  Medication Sig  . albuterol (VENTOLIN HFA) 108 (90 Base) MCG/ACT inhaler Inhale 2 puffs into the lungs every 6 (six) hours as needed for wheezing or shortness of breath.  Marland Kitchen aspirin EC 81 MG tablet Take 81 mg by mouth daily.  . baclofen (LIORESAL) 10 MG tablet Take 1 tablet (10 mg total) by mouth 3 (three) times daily.  . Cholecalciferol ([employer] D3) 2000 units CAPS Take 2,000 Units by mouth daily.   . digoxin (LANOXIN) 0.25 MG tablet Take 1 tablet (0.25 mg total) by mouth daily.  . Fluticasone-Umeclidin-Vilant 100-62.5-25 MCG/INH AEPB Inhale 1 puff into the lungs daily.  Marland Kitchen gabapentin (NEURONTIN) 400 MG capsule Take 1 capsule (400 mg total) by mouth 3 (three) times daily.  Marland Kitchen HYDROcodone-acetaminophen (NORCO/VICODIN) 5-325 MG tablet Take 1 tablet by mouth every 8 (eight) hours as needed for severe pain. Must last 30 days  . HYDROcodone-acetaminophen (NORCO/VICODIN) 5-325 MG tablet Take 1 tablet by mouth every 8 (eight) hours as needed for severe pain. Must last 30 days  . [START ON 10/05/2020] HYDROcodone-acetaminophen (NORCO/VICODIN) 5-325 MG tablet Take 1 tablet by mouth every 8 (eight) hours as needed for severe pain. Must last 30 days  . ipratropium-albuterol (DUONEB) 0.5-2.5 (3) MG/3ML SOLN USE 1 VIAL VIA NEBULIZER EVERY 6 HOURS AS NEEDED FOR SHORTNESS OF BREATH OR WHEEZING  . levothyroxine (SYNTHROID) 25 MCG tablet TAKE 1 TABLET(25 MCG) BY MOUTH DAILY BEFORE BREAKFAST  . OXYGEN Inhale 2.5 L/min into the lungs.  . potassium chloride SA (KLOR-CON) 20 MEQ tablet TAKE 1 TABLET(20 MEQ) BY MOUTH TWICE DAILY  . sertraline (ZOLOFT) 100 MG tablet TAKE 2 TABLETS BY  MOUTH EVERY MORNING  . torsemide (DEMADEX) 20 MG tablet Take 2 tablets (40 mg total) by mouth 2 (two) times daily.  . Treprostinil (TYVASO) 0.6 MG/ML SOLN Inhale 18 mcg into the lungs 4 (four) times daily.   No facility-administered medications prior to visit.    Review of Systems  Constitutional: Positive for fatigue. Negative for chills and fever.  HENT: Positive for congestion, postnasal drip and sneezing. Negative for rhinorrhea and sinus pressure.   Respiratory: Positive for cough.   Cardiovascular: Negative.   Gastrointestinal: Negative.   Musculoskeletal: Negative.       Objective    There were no vitals taken for this visit.  Attempted video visit but unable to may audio  connection adequate for communication. Several congested coughs during telephonic interview.   Assessment & Plan     1. COPD exacerbation ([place]) Onset after exposure to someone found to be COVID positive 09-13-20. Having some cough, fatigue, body aches and sore throat. Sputum white to yellow. History of COPD with hypoxia, pulmonary hypertension and COVID pneumonia (April 2021). Negative COVID test yesterday. May use Mucinex-DM, Tyvaso for pulmonary hypertension, Trelegy and Albuterol per pulmonologist (Dr. Raul Del). Add antibiotic and prednisone taper. May need to retest for COVID if no better over the weekend. Maintain COVID restrictions. - doxycycline (VIBRA-TABS) 100 MG tablet; Take 1 tablet (100 mg total) by mouth 2 (two) times daily.  Dispense: 20 tablet; Refill: 0 - predniSONE (DELTASONE) 10 MG tablet; Taper down by 1 tablet daily by mouth starting with 6 day 1, then 5 day 2, 4 day 3, 3 day 4, 2 day 5 and 1 day 6. Divide dosage among meals and bedtime each day.  Dispense: 21 tablet; Refill: 0  2. Sore throat Onset the past 4-5 days. No fever or difficulty swallowing. May use saltwater gargles and consider retesting for COVID-19 if no better over the weekend.   No follow-ups on file.    I discussed  the assessment and treatment plan with the patient. The patient was provided an opportunity to ask questions and all were answered. The patient agreed with the plan and demonstrated an understanding of the instructions.   The patient was advised to call back or seek an in-person evaluation if the symptoms worsen or if the condition fails to improve as anticipated.  I provided 20 minutes of non-face-to-face time during this encounter.  Andres Shad, PA, have reviewed all documentation for this visit. The documentation on 09/18/20 for the exam, diagnosis, procedures, and orders are all accurate and complete.   [name], [place] [phone] (phone) [phone] (fax)  [place]

## 2020-09-21 DIAGNOSIS — J9610 Chronic respiratory failure, unspecified whether with hypoxia or hypercapnia: Secondary | ICD-10-CM | POA: Diagnosis not present

## 2020-10-06 DIAGNOSIS — H2511 Age-related nuclear cataract, right eye: Secondary | ICD-10-CM | POA: Diagnosis not present

## 2020-10-06 DIAGNOSIS — Z961 Presence of intraocular lens: Secondary | ICD-10-CM | POA: Diagnosis not present

## 2020-10-08 DIAGNOSIS — J449 Chronic obstructive pulmonary disease, unspecified: Secondary | ICD-10-CM | POA: Diagnosis not present

## 2020-10-15 IMAGING — CT CT CHEST LUNG CANCER SCREENING LOW DOSE W/O CM
2 of 5 series · 15 of 40 positions shown, 18 images · non-contrast
Comparison: Chest radiograph 02/06/2020. Diagnostic chest CT
07/07/2019. Screening CT 03/25/2019.

CLINICAL DATA: Forty-five pack-year smoking history, current
smoker.

EXAM:
CT CHEST WITHOUT CONTRAST LOW-DOSE FOR LUNG CANCER SCREENING
TECHNIQUE: Multidetector CT imaging of the chest was performed following the
standard protocol without IV contrast.

[Series 3: lung 1.00 · axial · 0.70mm/px · z∈[-1216,-909]mm · 12 of 339 slices shown, 15 images]
[im 16/339  mediastinal]
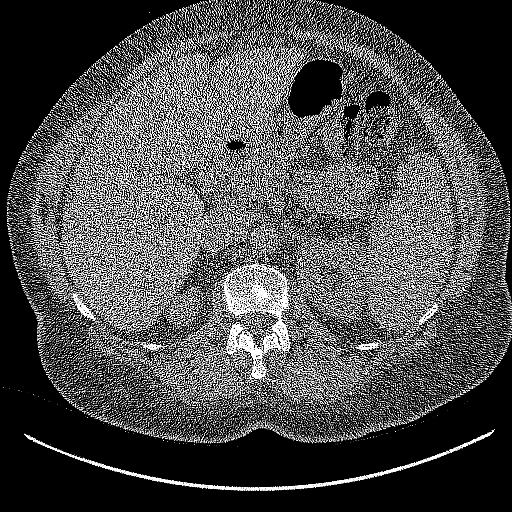
[im 16/339  lung]
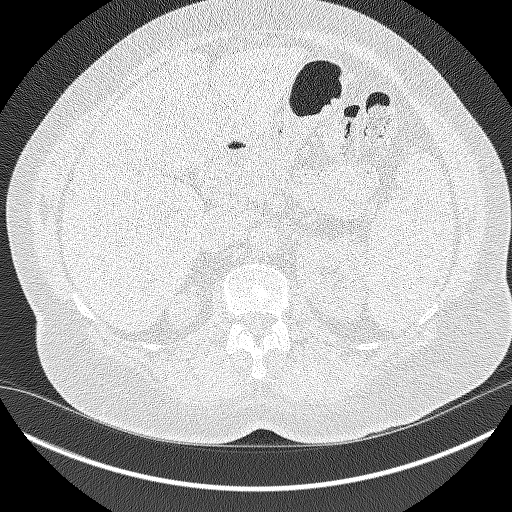
[im 47/339  lung]
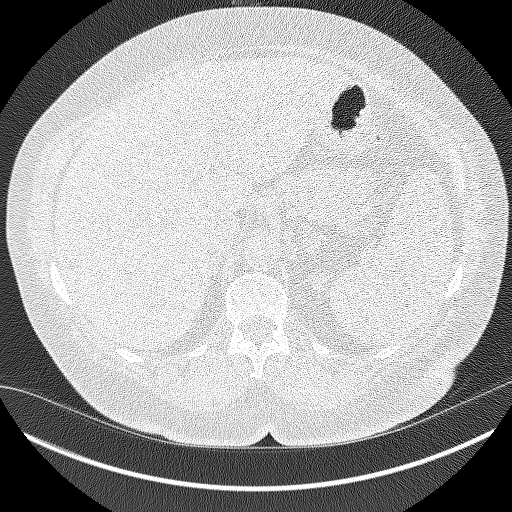
[im 77/339  lung]
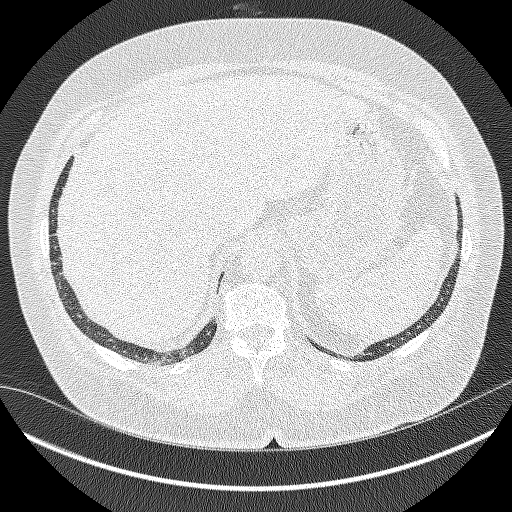
[im 108/339  lung]
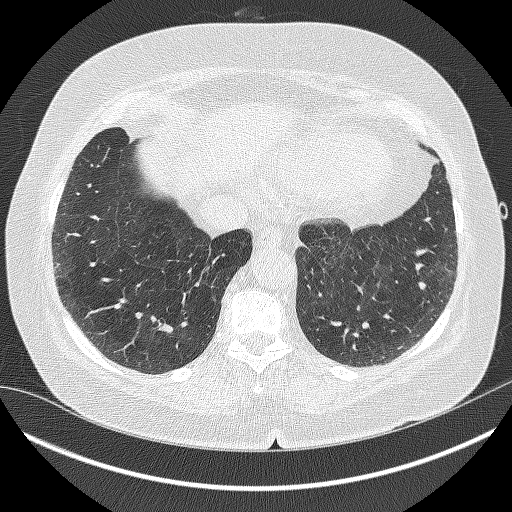
[im 123/339  mediastinal]
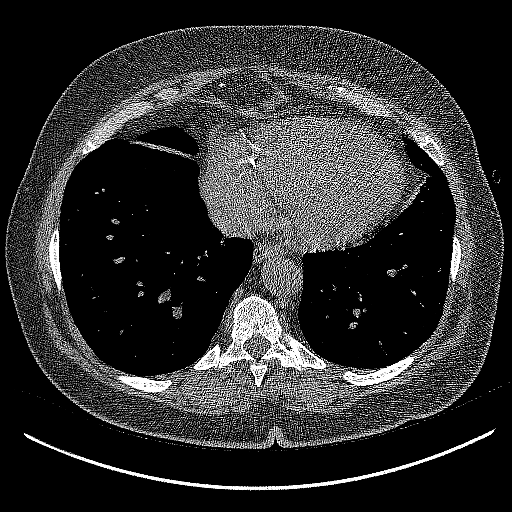
[im 123/339  lung]
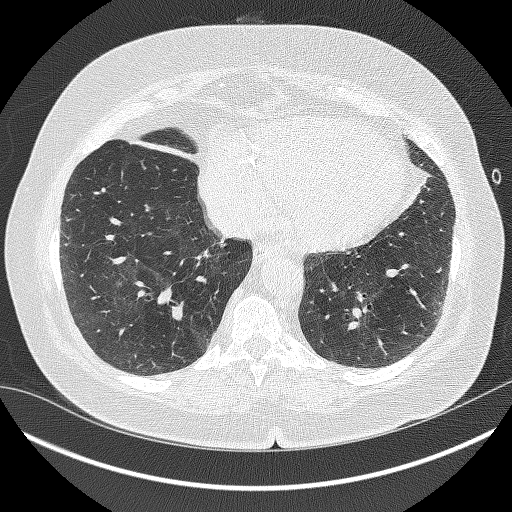
[im 154/339  lung]
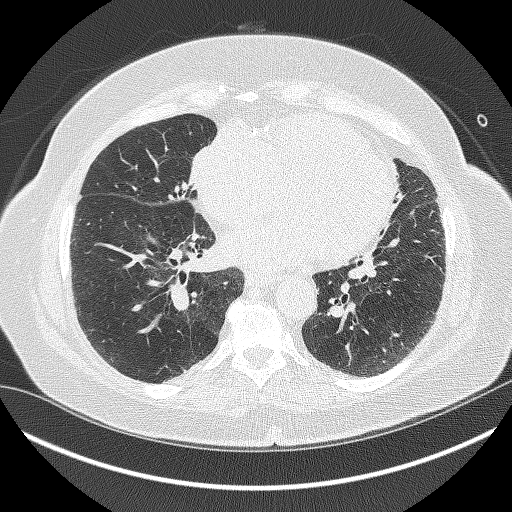
[im 185/339  lung]
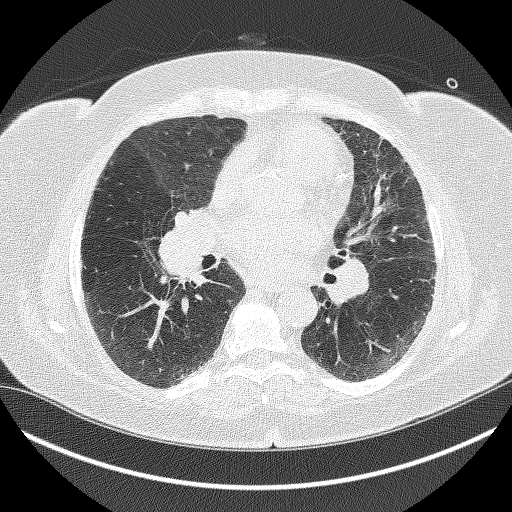
[im 216/339  lung]
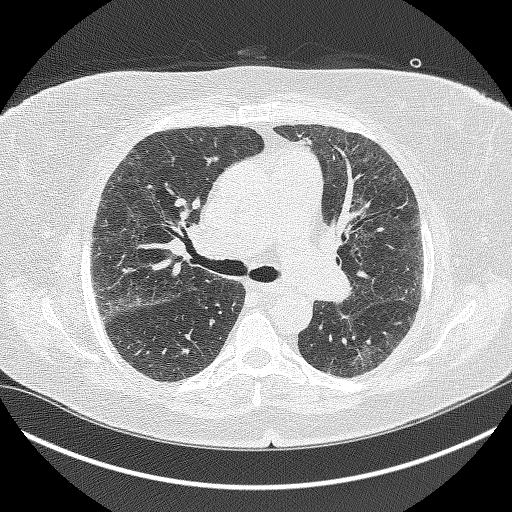
[im 231/339  mediastinal]
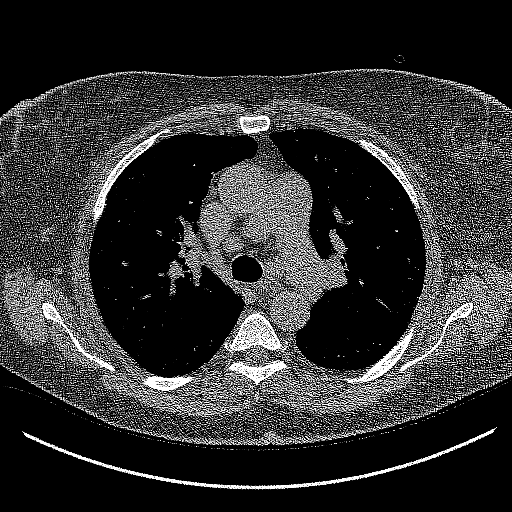
[im 231/339  lung]
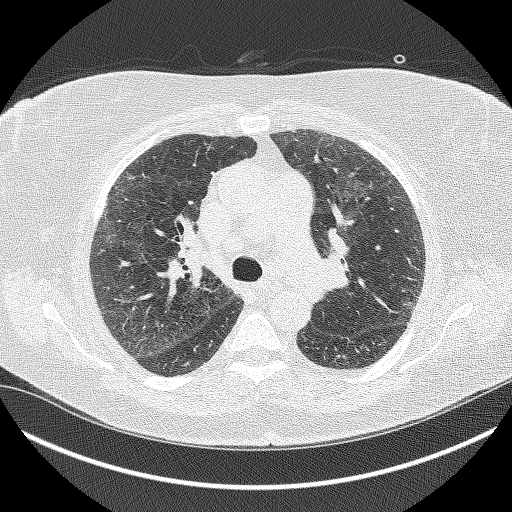
[im 262/339  lung]
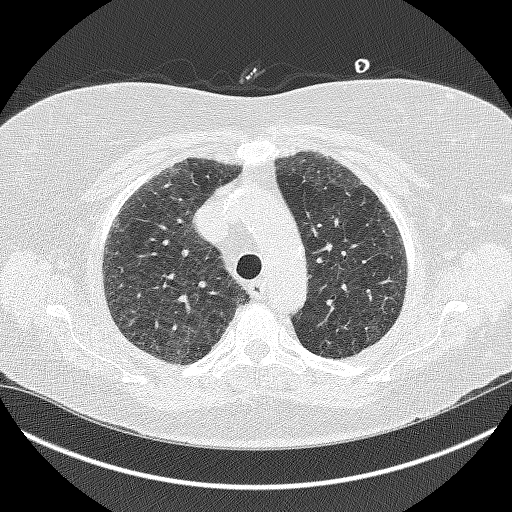
[im 292/339  lung]
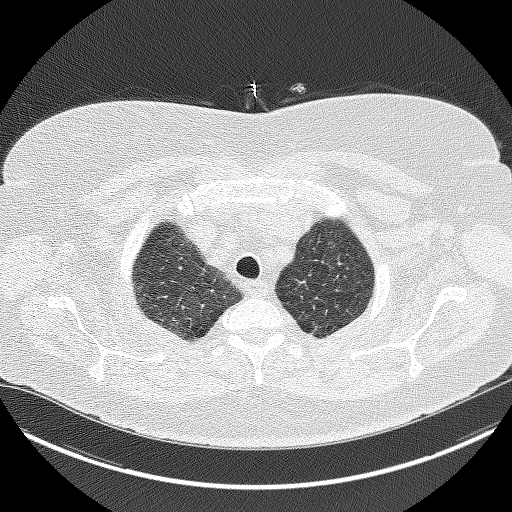
[im 323/339  lung]
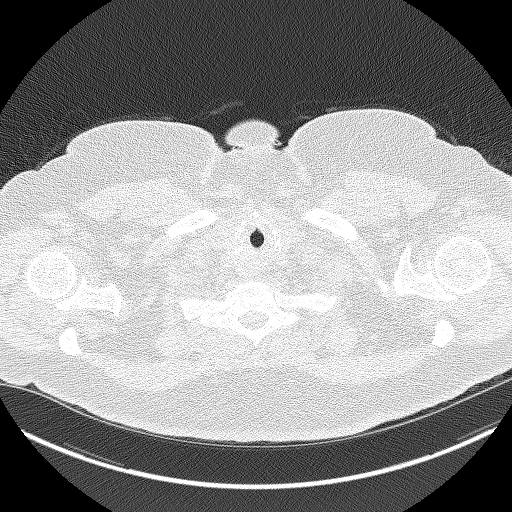

[Series 4: coronals lung 1.00 cor · coronal · 0.66mm/px · 3 of 290 slices shown]
[im 58/290  lung]
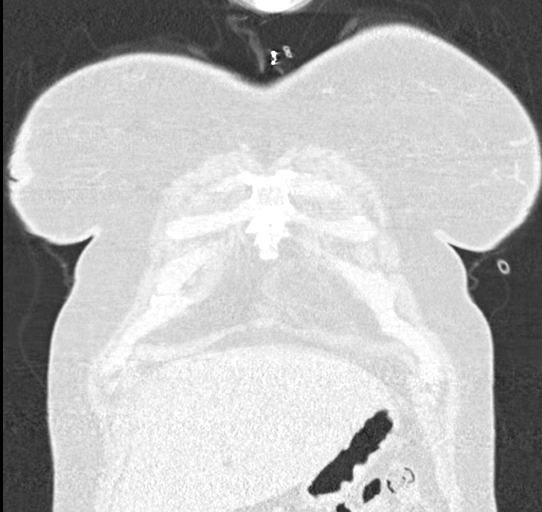
[im 116/290  lung]
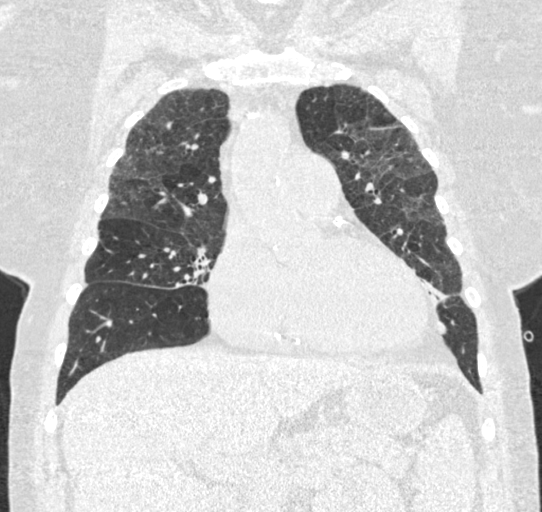
[im 174/290  lung]
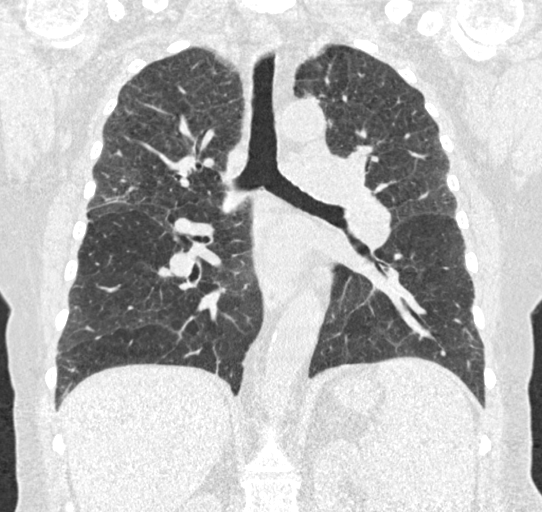

[15 of 40 positions shown; findings below may reference images not displayed]

FINDINGS: Cardiovascular: Aortic atherosclerosis. Tortuous thoracic aorta.
Mild cardiomegaly, without pericardial effusion. Multivessel
coronary artery atherosclerosis.

Pulmonary artery enlargement, outflow tract 3.6 cm.

Mediastinum/Nodes: Multiple small middle mediastinal nodes, none
pathologic by size criteria. Hilar regions poorly evaluated without
intravenous contrast.

Lungs/Pleura: No pleural fluid. Moderate centrilobular emphysema.
Lingular and less so right middle lobe scarring. Right-sided
pulmonary nodules of maximally volume derived equivalent diameter
4.3 mm. No dominant or significantly enlarged pulmonary nodule.

Upper Abdomen: Low-density bilateral liver lesions are similar,
likely cysts. Normal imaged portions of the spleen, stomach,
pancreas, adrenal glands, kidneys.

Musculoskeletal: No acute osseous abnormality.
IMPRESSION: 1. Lung-RADS 2, benign appearance or behavior. Continue annual
screening with low-dose chest CT without contrast in 12 months.
2. Pulmonary artery enlargement suggests pulmonary arterial
hypertension.
3. Aortic Atherosclerosis (K2IHW-N6L.L) and Emphysema ([id number].N).
4. Age advanced coronary artery atherosclerosis. Recommend
assessment of coronary risk factors and consideration of medical
therapy.

## 2020-10-21 IMAGING — CR DG CHEST 1V PORT
1 series · 1 of 1 positions shown · non-contrast
Comparison: CT 04/02/2020, radiograph 02/06/2020

CLINICAL DATA: Shortness of breath.  Productive cough.  Chest pain.

EXAM:
PORTABLE CHEST 1 VIEW

[chest pa]
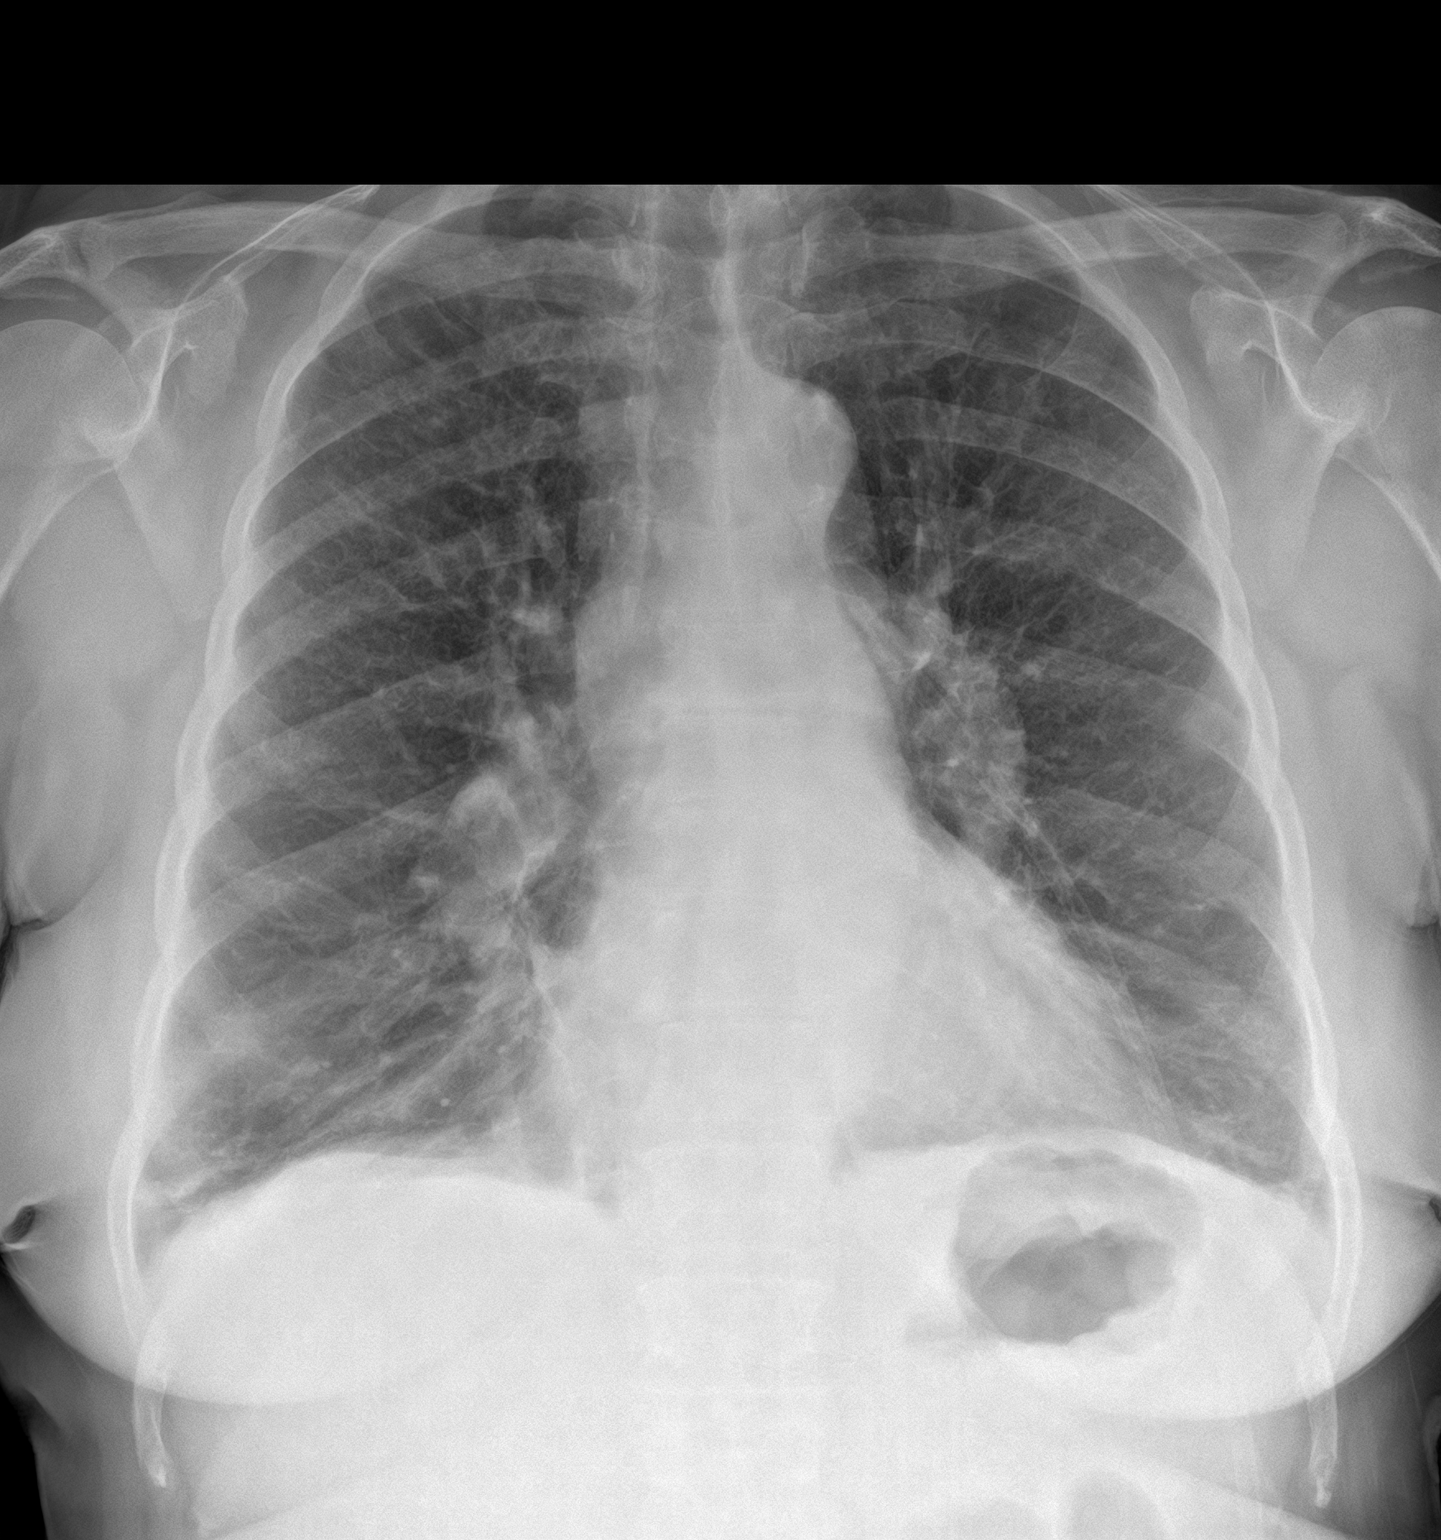

[1 of 1 positions shown; findings below may reference images not displayed]

FINDINGS: Chronic hyperinflation. Chronic bronchial thickening with
improvement from prior exam. Previous multifocal patchy airspace
opacities have improved. There is subsegmental opacities in the
periphery of both lung bases. No pneumothorax or pleural effusion.
Stable heart size and mediastinal contours. No acute osseous
abnormalities are seen.
IMPRESSION: 1. Chronic hyperinflation and bronchial thickening, imaging findings
consistent with COPD.
2. Subsegmental bibasilar opacities favor atelectasis over
pneumonia.

## 2020-10-22 DIAGNOSIS — J9610 Chronic respiratory failure, unspecified whether with hypoxia or hypercapnia: Secondary | ICD-10-CM | POA: Diagnosis not present

## 2020-10-22 IMAGING — CR DG CHEST 2V
1 series · 2 of 2 positions shown · non-contrast
Comparison: Chest x-ray 06/25/2020, 10/22/2019, 07/06/2019,
02/07/2018. CT chest 04/02/2020.

CLINICAL DATA: Shortness of breath.  Chest pain.

EXAM:
CHEST - 2 VIEW

[Series 1: dg chest 2 view · 0.14mm/px · 2 of 2 slices shown]
[im 1/2]
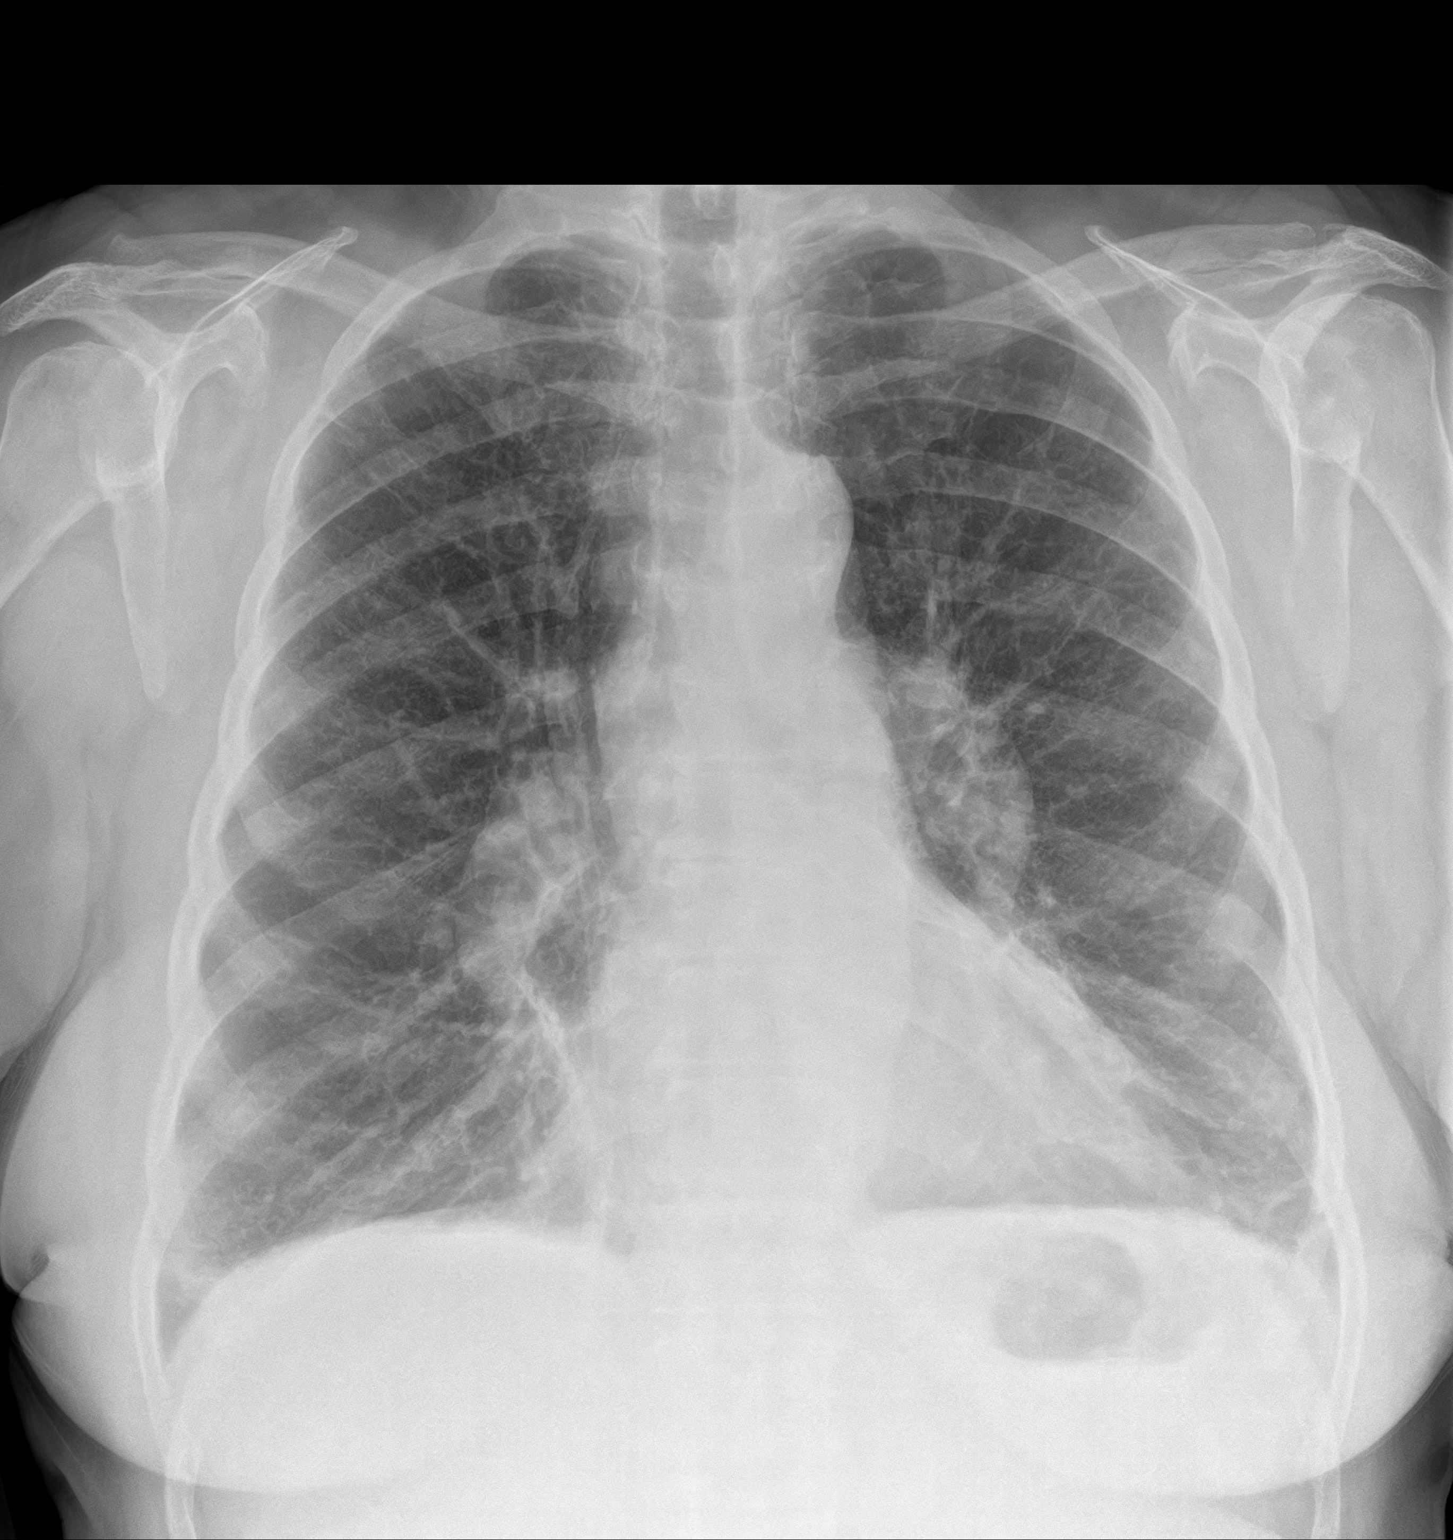
[im 2/2]
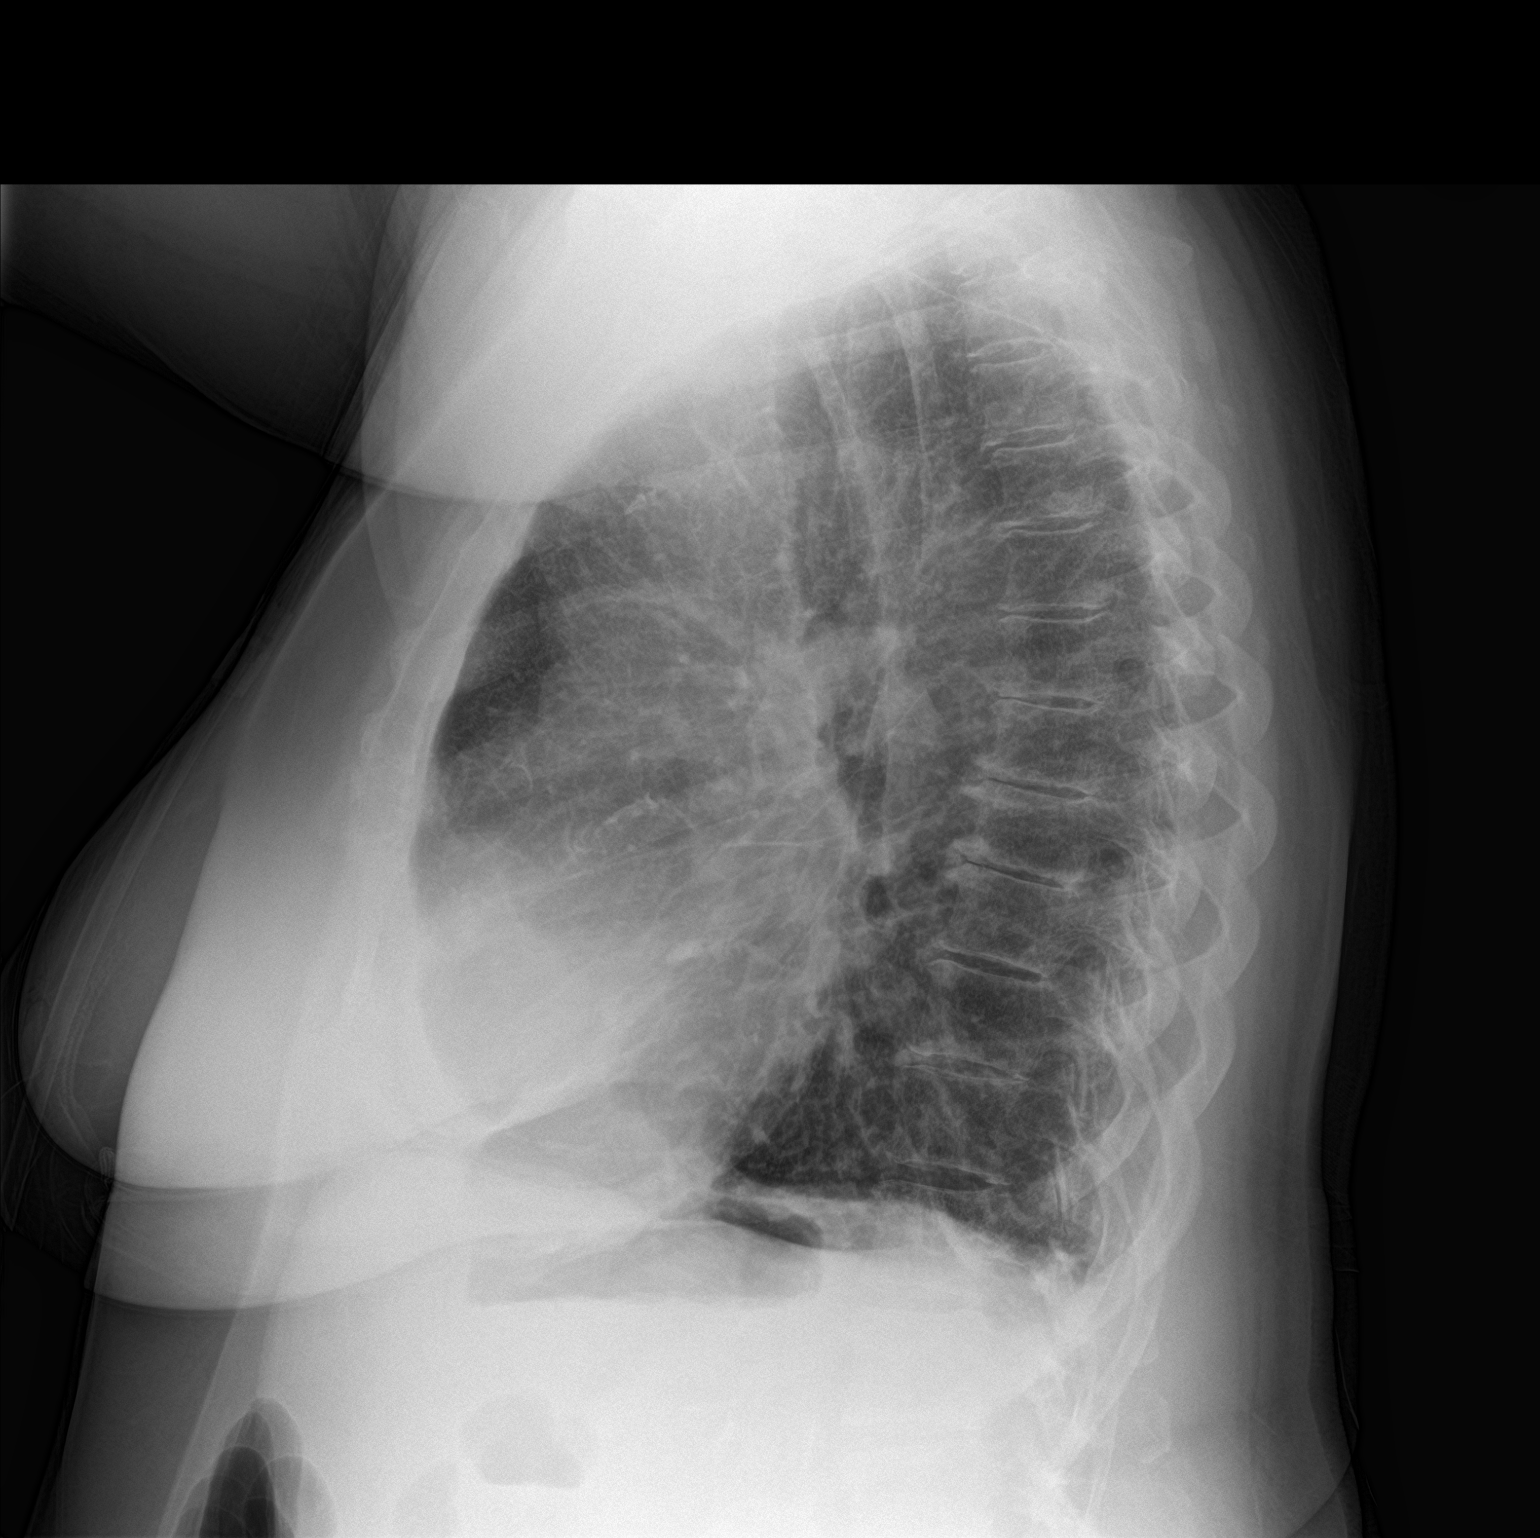

[2 of 2 positions shown; findings below may reference images not displayed]

FINDINGS: Mediastinum and hilar structures normal. Cardiomegaly. Prominent
central pulmonary arteries again noted suggesting pulmonary
hypertension. Chronic interstitial changes again noted consistent
chronic interstitial lung disease. Superimposed active mild
interstitial process cannot be excluded. Mild bibasilar subsegmental
atelectasis and or scarring again noted. No pleural effusion or
pneumothorax. No acute bony abnormality.
IMPRESSION: 1. Cardiomegaly. Prominent central pulmonary arteries again noted
suggesting pulmonary hypertension.

2. Chronic interstitial changes again noted consistent chronic
interstitial lung disease. Superimposed active mild interstitial
process including pneumonitis cannot be excluded. Mild bibasilar
subsegmental atelectasis and or scarring again noted.

## 2020-10-25 NOTE — Progress Notes (Signed)
PROVIDER NOTE: Information contained herein reflects review and annotations entered in association with encounter. Interpretation of such information and data should be left to medically-trained personnel. Information provided to patient can be located elsewhere in the medical record under "Patient Instructions". Document created using STT-dictation technology, any transcriptional errors that may result from process are unintentional.    Patient: Theresa [name]  Service Category: E/M  Provider: Gaspar Cola, MD  DOB: [birth year]  DOS: 10/28/2020  Specialty: Interventional Pain Management  MRN: [id number]  Setting: Ambulatory outpatient  PCP: Margo Common, PA-C  Type: Established Patient    Referring Provider: Margo Common, PA-C  Location: Office  Delivery: Face-to-face     HPI  Theresa [name], a 65 y.o. year old female, is here today because of her Chronic pain syndrome [G89.4]. Theresa [name] primary complain today is Back Pain Last encounter: My last encounter with her was on 08/03/2020. Pertinent problems: Theresa [name] has Tendinitis; Neuritis or radiculitis due to rupture of lumbar intervertebral disc; Decreased motor strength; DDD (degenerative disc disease), lumbar; Carpal tunnel syndrome; Neurogenic pain; Neuropathic pain; Musculoskeletal pain; Lumbar spondylosis (L4-5); Chronic low back pain (Primary Area of Pain) (Bilateral) (L>R); Chronic lower extremity pain (Secondary Area of Pain) (Left); Chronic lumbar radicular pain (L4 & S1 Dermatome) (Left); Chronic shoulder pain (Right side); Chronic upper extremity pain (Right-sided); Cervical radiculitis (Right side); Abnormal x-ray of lumbar spine; Chronic pain syndrome (significant psychosocial component); 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; 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); Lumbar facet syndrome (Bilateral) (L>R); Chronic sacroiliac joint pain (Bilateral); Lumbar facet hypertrophy (multilevel);  Lumbar spinal stenosis (L4-5); 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); 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); Lumbosacral radiculopathy at L4; Chronic shoulder pain (Left); Arthralgia of acromioclavicular joint (Right); Acromioclavicular joint DJD (Right); Osteoarthritis of shoulder (Right); Chronic hip pain (Tertiary Area of Pain) (Bilateral) (L>R); Chronic lower extremity pain (Bilateral) (L>R); Cervicalgia (Bilateral) (R>L); Spondylosis without myelopathy or radiculopathy, lumbosacral region; Chronic knee pain (Left); Chronic lumbar radiculopathy/radiculitis (L2) (Left); Abnormal MRI, lumbar spine (07/23/2019); Protruded lumbar disc (L2-3) (Left); Numbness and tingling in right hand; Numbness in feet; Weakness of both lower extremities; Generalized muscle weakness; and Left thigh pain on their pertinent problem list. Pain Assessment: Severity of Chronic pain is reported as a 7 /10. Location: Back Lower/pain shoot down her left side. Onset: More than a month ago. Quality: Constant,Aching,Nagging,Shooting. Timing: Constant. Modifying factor(s): sit down, meds. Vitals:  height is _0  (1.575 m) and weight is 187 lb (84.8 kg). Her temperature is 97.3 F (36.3 C) (abnormal). Her blood pressure is 112/71 and her pulse is 67. Her oxygen saturation is 89% (abnormal).   Reason for encounter: medication management.   The patient indicates doing well with the current medication regimen. No adverse reactions or side effects reported to the medications. PMP & UDS compliant.  The patient is doing extremely well, she has lost a total of 70 lbs and of course this has helped her back immensely.  I have encouraged her to continue and to get her weight as close to a BMI of 30 as possible.  She indicates that she will later start working on her smoking to see if he can get rid of that.  Today she is still in a wheelchair and using oxygen.  RTCB: 02/04/2021 Nonopioids transferred 08/03/2020:  Gabapentin and baclofen  Pharmacotherapy Assessment   Analgesic: Hydrocodone/APAP 5/325 one tablet every 8 hours (15 mg/day of hydrocodone) MME/day:15 mg/day.   Monitoring: [place]  PMP: PDMP reviewed during this encounter.       Pharmacotherapy: No side-effects or adverse reactions reported. Compliance: No problems identified. Effectiveness: Clinically acceptable.  Theresa Fischer, RN  10/28/2020 11:48 AM  Sign when Signing Visit Nursing Pain Medication Assessment:  Safety precautions to be maintained throughout the outpatient stay will include: orient to surroundings, keep bed in low position, maintain call bell within reach at all times, provide assistance with transfer out of bed and ambulation.  Medication Inspection Compliance: Pill count conducted under aseptic conditions, in front of the patient. Neither the pills nor the bottle was removed from the patient's sight at any time. Once count was completed pills were immediately returned to the patient in their original bottle.  Medication: Hydrocodone/APAP Pill/Patch Count: 26 of 90 pills remain Pill/Patch Appearance: Markings consistent with prescribed medication Bottle Appearance: Standard pharmacy container. Clearly labeled. Filled Date: 37 / 27 / 21 Last Medication intake:  TodaySafety precautions to be maintained throughout the outpatient stay will include: orient to surroundings, keep bed in low position, maintain call bell within reach at all times, provide assistance with transfer out of bed and ambulation.     UDS:  Summary  Date Value Ref Range Status  04/14/2020 Note  Final    Comment:    ==================================================================== [employer] (MW) ==================================================================== Test                             Result       Flag       Units  Drug Present and Declared for Prescription Verification   Hydrocodone                    2142         EXPECTED    ng/mg creat   Hydromorphone                  263          EXPECTED   ng/mg creat   Dihydrocodeine                 156          EXPECTED   ng/mg creat   Norhydrocodone                 2151         EXPECTED   ng/mg creat    Sources of hydrocodone include scheduled prescription medications.    Hydromorphone, dihydrocodeine and norhydrocodone are expected    metabolites of hydrocodone. Hydromorphone and dihydrocodeine are    also available as scheduled prescription medications.  ==================================================================== Test                      Result    Flag   Units      Ref Range   Creatinine              43               mg/dL      >=20 ==================================================================== Declared Medications:  The flagging and interpretation on this report are based on the  following declared medications.  Unexpected results may arise from  inaccuracies in the declared medications.   **Note: The testing scope of this panel includes these medications:   Hydrocodone (Norco)   **Note: The testing scope of this panel does not include the  following reported medications:  Acetaminophen (Norco)  Albuterol (Ventolin HFA)  Albuterol (Duoneb)  Aspirin  Baclofen (Lioresal)  Cholecalciferol  Digoxin (Lanoxin)  Fluticasone  Gabapentin (Neurontin)  Ipratropium (Duoneb)  Levothyroxine (Synthroid)  Potassium (Klor-Con)  Sertraline (Zoloft)  Torsemide (Demadex)  Umeclidinium  Vilanterol ==================================================================== For clinical consultation, please call [phone]. ====================================================================      ROS  Constitutional: Denies any fever or chills Gastrointestinal: No reported hemesis, hematochezia, vomiting, or acute GI distress Musculoskeletal: Denies any acute onset joint swelling, redness, loss of ROM, or weakness Neurological: No reported episodes of  acute onset apraxia, aphasia, dysarthria, agnosia, amnesia, paralysis, loss of coordination, or loss of consciousness  Medication Review  Cholecalciferol, Fluticasone-Umeclidin-Vilant, HYDROcodone-acetaminophen, Oxygen-Helium, Treprostinil, albuterol, aspirin EC, baclofen, digoxin, gabapentin, ipratropium-albuterol, levothyroxine, potassium chloride SA, sertraline, torsemide, and zaleplon  History Review  Allergy: Theresa [name] is allergic to codeine and meloxicam. Drug: Theresa [name]  reports no history of drug use. Alcohol:  reports no history of alcohol use. Tobacco:  reports that she has been smoking cigarettes. She has a 22.00 pack-year smoking history. She has never used smokeless tobacco. Social: Ms. Gadberry  reports that she has been smoking cigarettes. She has a 22.00 pack-year smoking history. She has never used smokeless tobacco. She reports that she does not drink alcohol and does not use drugs. Medical:  has a past medical history of Acute postoperative pain (10/03/2017), Anxiety, BiPAP (biphasic positive airway pressure) dependence, Carpal tunnel syndrome, CHF (congestive heart failure) ([place]), CHF (congestive heart failure) ([place]), Chronic generalized abdominal pain, Chronic rhinitis, COPD (chronic obstructive pulmonary disease) ([place]), DDD (degenerative disc disease), cervical, DDD (degenerative disc disease), lumbosacral, Depression, Dyspnea, Edema, Flu, Gastritis, GERD (gastroesophageal reflux disease), Hematuria, Hernia of abdominal wall (07/17/2018), Hernia, abdominal, Hypertension, Kidney stones, Low back pain, Lumbar radiculitis, Malodorous urine, Muscle weakness, Obesity, Oxygen dependent, Renal cyst, Sensory urge incontinence, Thyroid activity decreased, Tobacco abuse, and Wheezing. Surgical: Ms. Adel  has a past surgical history that includes Abdominal hysterectomy; Tonsillectomy; Carpal tunnel release (Left, 2012); Tubal ligation; epigastric hernia repair (N/A, 08/13/2018);  Umbilical hernia repair (N/A, 08/13/2018); and RIGHT/LEFT HEART CATH AND CORONARY ANGIOGRAPHY (N/A, 07/11/2019). Family: family history includes Alcohol abuse in her father; Breast cancer in her sister; Cancer in her brother, brother, and sister; Coronary artery disease in her mother; Heart disease in her father and mother; Lung cancer in her sister; Pneumonia in her brother; Stroke in her mother.  Laboratory Chemistry Profile   Renal Lab Results  Component Value Date   BUN 18 06/26/2020   CREATININE 0.84 06/26/2020   BCR 16 03/04/2020   GFRAA >60 06/26/2020   GFRNONAA >60 06/26/2020     Hepatic Lab Results  Component Value Date   AST 21 06/25/2020   ALT 22 06/25/2020   ALBUMIN 3.9 06/25/2020   ALKPHOS 94 06/25/2020   LIPASE 31 10/17/2017   AMMONIA 40 (H) 10/11/2019     Electrolytes Lab Results  Component Value Date   NA 140 06/26/2020   K 4.0 06/26/2020   CL 101 06/26/2020   CALCIUM 9.2 06/26/2020   MG 2.7 (H) 02/10/2020   PHOS 4.0 02/10/2020     Bone Lab Results  Component Value Date   25OHVITD1 13 (L) 09/17/2015   25OHVITD2 <1.0 09/17/2015   25OHVITD3 13 09/17/2015     Inflammation (CRP: Acute Phase) (ESR: Chronic Phase) Lab Results  Component Value Date   CRP 4.2 (H) 02/08/2020   ESRSEDRATE 16 08/08/2017   LATICACIDVEN 1.4 02/03/2020       Note: Above Lab  results reviewed.  Recent Imaging Review  CT Angio Chest PE W and/or Wo Contrast CLINICAL DATA:  Short of breath, upper back pain  EXAM: CT ANGIOGRAPHY CHEST WITH CONTRAST  TECHNIQUE: Multidetector CT imaging of the chest was performed using the standard protocol during bolus administration of intravenous contrast. Multiplanar CT image reconstructions and MIPs were obtained to evaluate the vascular anatomy.  CONTRAST:  31m OMNIPAQUE IOHEXOL 350 MG/ML SOLN  COMPARISON:  06/25/2020, 06/26/2020  FINDINGS: Cardiovascular: This is a technically adequate evaluation of the pulmonary vasculature.  No filling defects or pulmonary emboli. Dilation of the main pulmonary arteries consistent with pulmonary arterial hypertension.  Mild cardiomegaly. No pericardial effusion. Extensive atherosclerosis of the coronary vasculature greatest in the LAD distribution. Mild atherosclerosis of the aortic arch.  Mediastinum/Nodes: No enlarged mediastinal, hilar, or axillary lymph nodes. Thyroid gland, trachea, and esophagus demonstrate no significant findings.  Lungs/Pleura: Upper lobe predominant emphysema. No acute airspace disease, effusion, or pneumothorax. Central airways are patent.  Upper Abdomen: Subcentimeter hepatic hypodensities likely reflects cysts. No acute upper abdominal finding.  Musculoskeletal: No acute or destructive bony lesions. Reconstructed images demonstrate no additional findings.  Review of the MIP images confirms the above findings.  IMPRESSION: 1. No evidence of pulmonary embolus. 2. Dilation of the main pulmonary arteries consistent with pulmonary arterial hypertension. 3. Aortic Atherosclerosis (ICD10-I70.0) and Emphysema (ICD10-J43.9).  Electronically Signed   By: MRanda NgoM.D.   On: 06/26/2020 18:23 DG Chest 2 View CLINICAL DATA:  Shortness of breath.  Chest pain.  EXAM: CHEST - 2 VIEW  COMPARISON:  Chest x-ray 06/25/2020, 10/22/2019, 07/06/2019, 02/07/2018. CT chest 04/02/2020.  FINDINGS: Mediastinum and hilar structures normal. Cardiomegaly. Prominent central pulmonary arteries again noted suggesting pulmonary hypertension. Chronic interstitial changes again noted consistent chronic interstitial lung disease. Superimposed active mild interstitial process cannot be excluded. Mild bibasilar subsegmental atelectasis and or scarring again noted. No pleural effusion or pneumothorax. No acute bony abnormality.  IMPRESSION: 1. Cardiomegaly. Prominent central pulmonary arteries again noted suggesting pulmonary hypertension.  2. Chronic  interstitial changes again noted consistent chronic interstitial lung disease. Superimposed active mild interstitial process including pneumonitis cannot be excluded. Mild bibasilar subsegmental atelectasis and or scarring again noted.  Electronically Signed   By: TMarcello Moores Register   On: 06/26/2020 11:45 Note: Reviewed        Physical Exam  General appearance: Well nourished, well developed, and well hydrated. In no apparent acute distress Mental status: Alert, oriented x 3 (person, place, & time)       Respiratory: No evidence of acute respiratory distress Eyes: PERLA Vitals: BP 112/71    Pulse 67    Temp (!) 97.3 F (36.3 C)    Ht _0  (1.575 m)    Wt 187 lb (84.8 kg)    SpO2 (!) 89% Comment: 2 liters   BMI 34.20 kg/m  BMI: Estimated body mass index is 34.2 kg/m as calculated from the following:   Height as of this encounter: _1  (1.575 m).   Weight as of this encounter: 187 lb (84.8 kg). Ideal: Ideal body weight: 50.1 kg (110 lb 7.2 oz) Adjusted ideal body weight: 64 kg (141 lb 1.1 oz)  Assessment   Status Diagnosis  Controlled Controlled Controlled 1. Chronic pain syndrome (significant psychosocial component)   2. Chronic low back pain (Primary Area of Pain) (Bilateral) (L>R)   3. Chronic lower extremity pain (Secondary Area of Pain) (Left)   4. Chronic hip pain (Tertiary Area of Pain) (Bilateral) (L>R)  5. Left thigh pain   6. Pharmacologic therapy   7. Uncomplicated opioid dependence ([place])      Updated Problems: Problem  Left Thigh Pain  Numbness and Tingling in Right Hand  Numbness in Feet  Weakness of Both Lower Extremities  Tendinitis   Overview:  right shoulder  Formatting of this note might be different from the original. right shoulder   Generalized Muscle Weakness   Formatting of this note might be different from the original. Recent weakness and feeling "off balance" has improved over night. Reports from [facility] did not show any evidence of  stroke or MI. Suspect TIA and will treat with Aggrenox. Also schedule neurology referral.   Pneumonia Due to Covid-19 Virus  Altered Consciousness  Frequent Falls  Edema   Formatting of this note might be different from the original. Recent onset with history of extra salt/sodium in diet. Recommend restriction to 2gm daily and treat with diuretic. Will recheck in 2 weeks if needed   Herpes Zoster   Overview:  ASSESSMENT: Improved. Had to switch to Vicodin for better pain control. Finish the Valtrex today. May need Lidoderm Patches if neuralgia appears.  Formatting of this note might be different from the original. ASSESSMENT: Improved. Had to switch to Vicodin for better pain control. Finish the Valtrex today. May need Lidoderm Patches if neuralgia appears.   Nausea Without Vomiting   Formatting of this note might be different from the original. [place] recently. Will consider further testing or imaging pending report of ER and Urgent Care.   Abdominal Pain, Periumbilical (Resolved)  Abdominal Pain, Generalized (Resolved)  Soft Tissue Lesion of Shoulder Region (Resolved)  Acute Respiratory Failure With Hypoxia (Hcc) (Resolved)  Acute Respiratory Failure (Hcc) (Resolved)  Acute on chronic diastolic congestive heart failure (HCC) (Resolved)  Respiratory Insufficiency (Resolved)  Hypoventilation, Idiopathic (Resolved)  Abnormal Result of Cardiovascular Function Study (Resolved)  Acute On Chronic Respiratory Failure With Hypoxemia (Hcc) (Resolved)  Chronic Respiratory Failure With Hypoxia (Hcc) (Resolved)  Bp (High Blood Pressure) (Resolved)  Chronic Respiratory Acidosis (Resolved)  Accumulation of Fluid in Tissues (Resolved)   Overview:  Recent onset with history of extra salt/sodium in diet. Recommend restriction to 2gm daily and treat with diuretic. Will recheck in 2 weeks if needed   Adiposity (Resolved)    Plan of Care  Problem-specific:  No problem-specific Assessment &  Plan notes found for this encounter.  Theresa [name] has a current medication list which includes the following long-term medication(s): albuterol, baclofen, digoxin, gabapentin, ipratropium-albuterol, levothyroxine, potassium chloride sa, sertraline, torsemide, zaleplon, [START ON 11/06/2020] hydrocodone-acetaminophen, [START ON 12/06/2020] hydrocodone-acetaminophen, and [START ON 01/05/2021] hydrocodone-acetaminophen.  Pharmacotherapy (Medications Ordered): Meds ordered this encounter  Medications   HYDROcodone-acetaminophen (NORCO/VICODIN) 5-325 MG tablet    Sig: Take 1 tablet by mouth every 8 (eight) hours as needed for severe pain. Must last 30 days    Dispense:  90 tablet    Refill:  0    Chronic Pain: STOP Act (Not applicable) Fill 1 day early if closed on refill date. Avoid benzodiazepines within 8 hours of opioids   HYDROcodone-acetaminophen (NORCO/VICODIN) 5-325 MG tablet    Sig: Take 1 tablet by mouth every 8 (eight) hours as needed for severe pain. Must last 30 days    Dispense:  90 tablet    Refill:  0    Chronic Pain: STOP Act (Not applicable) Fill 1 day early if closed on refill date. Avoid benzodiazepines within 8 hours of opioids   HYDROcodone-acetaminophen (NORCO/VICODIN)  5-325 MG tablet    Sig: Take 1 tablet by mouth every 8 (eight) hours as needed for severe pain. Must last 30 days    Dispense:  90 tablet    Refill:  0    Chronic Pain: STOP Act (Not applicable) Fill 1 day early if closed on refill date. Avoid benzodiazepines within 8 hours of opioids   Orders:  No orders of the defined types were placed in this encounter.  Follow-up plan:   Return in about 3 months (around 02/04/2021) for (F2F), (Med Mgmt).      Interventional management options: Planned, scheduled, and/or pending: NOTE: NO further Lumbar RFAuntil BMI <35. Diagnostic left L2-3 LESI #1 today   Considering: Palliative procedures    Palliative PRN  treatment(s): Palliativebilaterallumbar facetblock #4 Diagnostic/palliative left L4TFESI #2 Diagnostic/palliative left L4-5 LESI #3 Diagnostic/palliativerightL4 TFESI #2 Diagnostic/palliativerightL4-5 LESI #2 Palliative leftlumbar facet RFA#2(done on 06/27/2017) Palliative right lumbar facet RFA#2(done on 10/03/2017)    Recent Visits Date Type Provider Dept  08/03/20 Office Visit Milinda Pointer, MD [facility]  Showing recent visits within past 90 days and meeting all other requirements Today's Visits Date Type Provider Dept  10/28/20 Office Visit Milinda Pointer, MD [facility]  Showing today's visits and meeting all other requirements Future Appointments No visits were found meeting these conditions. Showing future appointments within next 90 days and meeting all other requirements  I discussed the assessment and treatment plan with the patient. The patient was provided [name] opportunity to ask questions and all were answered. The patient agreed with the plan and demonstrated [name] understanding of the instructions.  Patient advised to call back or seek [name] in-person evaluation if the symptoms or condition worsens.  Duration of encounter: 25 minutes.  Note by: Gaspar Cola, MD Date: 10/28/2020; Time: 12:03 PM

## 2020-10-28 ENCOUNTER — Other Ambulatory Visit: Payer: Self-pay

## 2020-10-28 ENCOUNTER — Ambulatory Visit: Payer: Medicare HMO | Attending: Pain Medicine | Admitting: Pain Medicine

## 2020-10-28 ENCOUNTER — Encounter: Payer: Self-pay | Admitting: Pain Medicine

## 2020-10-28 VITALS — BP 112/71 | HR 67 | Temp 97.3°F | Ht 62.0 in | Wt 187.0 lb

## 2020-10-28 DIAGNOSIS — M25551 Pain in right hip: Secondary | ICD-10-CM | POA: Diagnosis not present

## 2020-10-28 DIAGNOSIS — Z79899 Other long term (current) drug therapy: Secondary | ICD-10-CM | POA: Insufficient documentation

## 2020-10-28 DIAGNOSIS — M25552 Pain in left hip: Secondary | ICD-10-CM | POA: Diagnosis not present

## 2020-10-28 DIAGNOSIS — F1120 Opioid dependence, uncomplicated: Secondary | ICD-10-CM | POA: Insufficient documentation

## 2020-10-28 DIAGNOSIS — M79652 Pain in left thigh: Secondary | ICD-10-CM | POA: Insufficient documentation

## 2020-10-28 DIAGNOSIS — G894 Chronic pain syndrome: Secondary | ICD-10-CM | POA: Insufficient documentation

## 2020-10-28 DIAGNOSIS — G8929 Other chronic pain: Secondary | ICD-10-CM | POA: Insufficient documentation

## 2020-10-28 DIAGNOSIS — M545 Low back pain, unspecified: Secondary | ICD-10-CM | POA: Diagnosis not present

## 2020-10-28 DIAGNOSIS — M79605 Pain in left leg: Secondary | ICD-10-CM

## 2020-10-28 MED ORDER — HYDROCODONE-ACETAMINOPHEN 5-325 MG PO TABS: 1.0000 | ORAL_TABLET | Freq: Three times a day (TID) | ORAL | 0 refills | Status: DC | PRN

## 2020-10-28 NOTE — Progress Notes (Signed)
Nursing Pain Medication Assessment:  Safety precautions to be maintained throughout the outpatient stay will include: orient to surroundings, keep bed in low position, maintain call bell within reach at all times, provide assistance with transfer out of bed and ambulation.  Medication Inspection Compliance: Pill count conducted under aseptic conditions, in front of the patient. Neither the pills nor the bottle was removed from the patient's sight at any time. Once count was completed pills were immediately returned to the patient in their original bottle.  Medication: Hydrocodone/APAP Pill/Patch Count: 26 of 90 pills remain Pill/Patch Appearance: Markings consistent with prescribed medication Bottle Appearance: Standard pharmacy container. Clearly labeled. Filled Date: 60 / 58 / 21 Last Medication intake:  TodaySafety precautions to be maintained throughout the outpatient stay will include: orient to surroundings, keep bed in low position, maintain call bell within reach at all times, provide assistance with transfer out of bed and ambulation.

## 2020-10-28 NOTE — Patient Instructions (Signed)
____________________________________________________________________________________________  Medication Rules  Purpose: To inform patients, and their family members, of our rules and regulations.  Applies to: All patients receiving prescriptions (written or electronic).  Pharmacy of record: Pharmacy where electronic prescriptions will be sent. If written prescriptions are taken to a different pharmacy, please inform the nursing staff. The pharmacy listed in the electronic medical record should be the one where you would like electronic prescriptions to be sent.  Electronic prescriptions: In compliance with the [place] (STOP) Act of 2017 (Session Lanny Cramp [phone]), effective October 17, 2018, all controlled substances must be electronically prescribed. Calling prescriptions to the pharmacy will cease to exist.  Prescription refills: Only during scheduled appointments. Applies to all prescriptions.  NOTE: The following applies primarily to controlled substances (Opioid* Pain Medications).   Type of encounter (visit): For patients receiving controlled substances, face-to-face visits are required. (Not an option or up to the patient.)  Patient's responsibilities: 1. Pain Pills: Bring all pain pills to every appointment (except for procedure appointments). 2. Pill Bottles: Bring pills in original pharmacy bottle. Always bring the newest bottle. Bring bottle, even if empty. 3. Medication refills: You are responsible for knowing and keeping track of what medications you take and those you need refilled. The day before your appointment: write a list of all prescriptions that need to be refilled. The day of the appointment: give the list to the admitting nurse. Prescriptions will be written only during appointments. No prescriptions will be written on procedure days. If you forget a medication: it will not be "Called in", "Faxed", or "electronically sent".  You will need to get another appointment to get these prescribed. No early refills. Do not call asking to have your prescription filled early. 4. Prescription Accuracy: You are responsible for carefully inspecting your prescriptions before leaving our office. Have the discharge nurse carefully go over each prescription with you, before taking them home. Make sure that your name is accurately spelled, that your address is correct. Check the name and dose of your medication to make sure it is accurate. Check the number of pills, and the written instructions to make sure they are clear and accurate. Make sure that you are given enough medication to last until your next medication refill appointment. 5. Taking Medication: Take medication as prescribed. When it comes to controlled substances, taking less pills or less frequently than prescribed is permitted and encouraged. Never take more pills than instructed. Never take medication more frequently than prescribed.  6. Inform other Doctors: Always inform, all of your healthcare providers, of all the medications you take. 7. Pain Medication from other Providers: You are not allowed to accept any additional pain medication from any other Doctor or Healthcare provider. There are two exceptions to this rule. (see below) In the event that you require additional pain medication, you are responsible for notifying us, as stated below. 8. Cough Medicine: Often these contain an opioid, such as codeine or hydrocodone. Never accept or take cough medicine containing these opioids if you are already taking an opioid* medication. The combination may cause respiratory failure and death. 9. Medication Agreement: You are responsible for carefully reading and following our Medication Agreement. This must be signed before receiving any prescriptions from our practice. Safely store a copy of your signed Agreement. Violations to the Agreement will result in no further prescriptions.  (Additional copies of our Medication Agreement are available upon request.) 10. Laws, Rules, & Regulations: All patients are expected to follow all  Federal and State Laws, Statutes, Rules, & Regulations. Ignorance of the Laws does not constitute a valid excuse.  11. Illegal drugs and Controlled Substances: The use of illegal substances (including, but not limited to marijuana and its derivatives) and/or the illegal use of any controlled substances is strictly prohibited. Violation of this rule may result in the immediate and permanent discontinuation of any and all prescriptions being written by our practice. The use of any illegal substances is prohibited. 12. Adopted CDC guidelines & recommendations: Target dosing levels will be at or below 60 MME/day. Use of benzodiazepines** is not recommended.  Exceptions: There are only two exceptions to the rule of not receiving pain medications from other Healthcare Providers. 1. Exception #1 (Emergencies): In the event of an emergency (i.e.: accident requiring emergency care), you are allowed to receive additional pain medication. However, you are responsible for: As soon as you are able, call our office [phone], at any time of the day or night, and leave a message stating your name, the date and nature of the emergency, and the name and dose of the medication prescribed. In the event that your call is answered by a member of our staff, make sure to document and save the date, time, and the name of the person that took your information.  2. Exception #2 (Planned Surgery): In the event that you are scheduled by another doctor or dentist to have any type of surgery or procedure, you are allowed (for a period no longer than 30 days), to receive additional pain medication, for the acute post-op pain. However, in this case, you are responsible for picking up a copy of our "Post-op Pain Management for Surgeons" handout, and giving it to your surgeon or dentist. This  document is available at our office, and does not require an appointment to obtain it. Simply go to our office during business hours (Monday-Thursday from 8:00 AM to 4:00 PM) (Friday 8:00 AM to 12:00 Noon) or if you have a scheduled appointment with us, prior to your surgery, and ask for it by name. In addition, you are responsible for: calling our office [phone], at any time of the day or night, and leaving a message stating your name, name of your surgeon, type of surgery, and date of procedure or surgery. Failure to comply with your responsibilities may result in termination of therapy involving the controlled substances.  *Opioid medications include: morphine, codeine, oxycodone, oxymorphone, hydrocodone, hydromorphone, meperidine, tramadol, tapentadol, buprenorphine, fentanyl, methadone. **Benzodiazepine medications include: diazepam (Valium), alprazolam (Xanax), clonazepam (Klonopine), lorazepam (Ativan), clorazepate (Tranxene), chlordiazepoxide (Librium), estazolam (Prosom), oxazepam (Serax), temazepam (Restoril), triazolam (Halcion) (Last updated: 09/14/2020) ____________________________________________________________________________________________   ____________________________________________________________________________________________  Medication Recommendations and Reminders  Applies to: All patients receiving prescriptions (written and/or electronic).  Medication Rules & Regulations: These rules and regulations exist for your safety and that of others. They are not flexible and neither are we. Dismissing or ignoring them will be considered "non-compliance" with medication therapy, resulting in complete and irreversible termination of such therapy. (See document titled "Medication Rules" for more details.) In all conscience, because of safety reasons, we cannot continue providing a therapy where the patient does not follow instructions.  Pharmacy of record:   Definition:  This is the pharmacy where your electronic prescriptions will be sent.   We do not endorse any particular pharmacy, however, we have experienced problems with [facility] not securing enough medication supply for the community.  We do not restrict you in your choice of pharmacy. However,   once we write for your prescriptions, we will NOT be re-sending more prescriptions to fix restricted supply problems created by your pharmacy, or your insurance.   The pharmacy listed in the electronic medical record should be the one where you want electronic prescriptions to be sent.  If you choose to change pharmacy, simply notify our nursing staff.  Recommendations:  Keep all of your pain medications in a safe place, under lock and key, even if you live alone. We will NOT replace lost, stolen, or damaged medication.  After you fill your prescription, take 1 week's worth of pills and put them away in a safe place. You should keep a separate, properly labeled bottle for this purpose. The remainder should be kept in the original bottle. Use this as your primary supply, until it runs out. Once it's gone, then you know that you have 1 week's worth of medicine, and it is time to come in for a prescription refill. If you do this correctly, it is unlikely that you will ever run out of medicine.  To make sure that the above recommendation works, it is very important that you make sure your medication refill appointments are scheduled at least 1 week before you run out of medicine. To do this in an effective manner, make sure that you do not leave the office without scheduling your next medication management appointment. Always ask the nursing staff to show you in your prescription , when your medication will be running out. Then arrange for the receptionist to get you a return appointment, at least 7 days before you run out of medicine. Do not wait until you have 1 or 2 pills left, to come in. This is very poor planning and  does not take into consideration that we may need to cancel appointments due to bad weather, sickness, or emergencies affecting our staff.  DO NOT ACCEPT A "Partial Fill": If for any reason your pharmacy does not have enough pills/tablets to completely fill or refill your prescription, do not allow for a "partial fill". The law allows the pharmacy to complete that prescription within 72 hours, without requiring a new prescription. If they do not fill the rest of your prescription within those 72 hours, you will need a separate prescription to fill the remaining amount, which we will NOT provide. If the reason for the partial fill is your insurance, you will need to talk to the pharmacist about payment alternatives for the remaining tablets, but again, DO NOT ACCEPT A PARTIAL FILL, unless you can trust your pharmacist to obtain the remainder of the pills within 72 hours.  Prescription refills and/or changes in medication(s):   Prescription refills, and/or changes in dose or medication, will be conducted only during scheduled medication management appointments. (Applies to both, written and electronic prescriptions.)  No refills on procedure days. No medication will be changed or started on procedure days. No changes, adjustments, and/or refills will be conducted on a procedure day. Doing so will interfere with the diagnostic portion of the procedure.  No phone refills. No medications will be "called into the pharmacy".  No Fax refills.  No weekend refills.  No Holliday refills.  No after hours refills.  Remember:  Business hours are:  Monday to Thursday 8:00 AM to 4:00 PM Provider's Schedule: Milinda Pointer, MD - Appointments are:  Medication management: Monday and Wednesday 8:00 AM to 4:00 PM Procedure day: Tuesday and Thursday 7:30 AM to 4:00 PM Gillis Santa, MD - Appointments are:  Medication management: Tuesday and Thursday 8:00 AM to 4:00 PM Procedure day: Monday and Wednesday  7:30 AM to 4:00 PM (Last update: 05/06/2020) ____________________________________________________________________________________________   ____________________________________________________________________________________________  CBD (cannabidiol) WARNING  Applicable to: All individuals currently taking or considering taking CBD (cannabidiol) and, more important, all patients taking opioid analgesic controlled substances (pain medication). (Example: oxycodone; oxymorphone; hydrocodone; hydromorphone; morphine; methadone; tramadol; tapentadol; fentanyl; buprenorphine; butorphanol; dextromethorphan; meperidine; codeine; etc.)  Legal status: CBD remains a Schedule I drug prohibited for any use. CBD is illegal with one exception. In the [place], CBD has a limited Transport planner (FDA) approval for the treatment of two specific types of epilepsy disorders. Only one CBD product has been approved by the FDA for this purpose: "Epidiolex". FDA is aware that some companies are marketing products containing cannabis and cannabis-derived compounds in ways that violate the [employer], Drug and Cosmetic Act [facility] Act) and that may put the health and safety of consumers at risk. The FDA, a Federal agency, has not enforced the CBD status since 2018.   Legality: Some manufacturers ship CBD products nationally, which is illegal. Often such products are sold online and are therefore available throughout the country. CBD is openly sold in head shops and health food stores in some states where such sales have not been explicitly legalized. Selling unapproved products with unsubstantiated therapeutic claims is not only a violation of the law, but also can put patients at risk, as these products have not been proven to be safe or effective. Federal illegality makes it difficult to conduct research on CBD.  Reference: "FDA Regulation of Cannabis and Cannabis-Derived Products, Including Cannabidiol  (CBD)" - [employer]  Warning: CBD is not FDA approved and has not undergo the same manufacturing controls as prescription drugs.  This means that the purity and safety of available CBD may be questionable. Most of the time, despite manufacturer's claims, it is contaminated with THC (delta-9-tetrahydrocannabinol - the chemical in marijuana responsible for the "HIGH").  When this is the case, the [facility] contaminant will trigger a positive urine drug screen (UDS) test for Marijuana (carboxy-THC). Because a positive UDS for any illicit substance is a violation of our medication agreement, your opioid analgesics (pain medicine) may be permanently discontinued.  MORE ABOUT CBD  General Information: CBD  is a derivative of the Marijuana (cannabis sativa) plant discovered in 64. It is one of the 113 identified substances found in Marijuana. It accounts for up to 40% of the plant's extract. As of 2018, preliminary clinical studies on CBD included research for the treatment of anxiety, movement disorders, and pain. CBD is available and consumed in multiple forms, including inhalation of smoke or vapor, as an aerosol spray, and by mouth. It may be supplied as an oil containing CBD, capsules, dried cannabis, or as a liquid solution. CBD is thought not to be as psychoactive as THC (delta-9-tetrahydrocannabinol - the chemical in marijuana responsible for the "HIGH"). Studies suggest that CBD may interact with different biological target receptors in the body, including cannabinoid and other neurotransmitter receptors. As of 2018 the mechanism of action for its biological effects has not been determined.  Side-effects  Adverse reactions: Dry mouth, diarrhea, decreased appetite, fatigue, drowsiness, malaise, weakness, sleep disturbances, and others.  Drug interactions: CBC may interact with other  medications such as blood-thinners. (Last update: 05/23/2020) ____________________________________________________________________________________________

## 2020-11-06 DIAGNOSIS — I5032 Chronic diastolic (congestive) heart failure: Secondary | ICD-10-CM | POA: Diagnosis not present

## 2020-11-08 DIAGNOSIS — J449 Chronic obstructive pulmonary disease, unspecified: Secondary | ICD-10-CM | POA: Diagnosis not present

## 2020-11-13 DIAGNOSIS — M65332 Trigger finger, left middle finger: Secondary | ICD-10-CM | POA: Diagnosis not present

## 2020-11-18 ENCOUNTER — Ambulatory Visit
Admission: RE | Admit: 2020-11-18 | Discharge: 2020-11-18 | Disposition: A | Discharge status: Home / Self Care | Payer: Medicare HMO | Source: Ambulatory Visit | Attending: Family Medicine | Admitting: Family Medicine

## 2020-11-18 ENCOUNTER — Other Ambulatory Visit: Payer: Self-pay

## 2020-11-18 ENCOUNTER — Other Ambulatory Visit: Payer: Self-pay | Admitting: Family Medicine

## 2020-11-18 DIAGNOSIS — Z1231 Encounter for screening mammogram for malignant neoplasm of breast: Secondary | ICD-10-CM | POA: Insufficient documentation

## 2020-11-18 DIAGNOSIS — F411 Generalized anxiety disorder: Secondary | ICD-10-CM

## 2020-11-22 DIAGNOSIS — J9610 Chronic respiratory failure, unspecified whether with hypoxia or hypercapnia: Secondary | ICD-10-CM | POA: Diagnosis not present

## 2020-11-24 NOTE — Progress Notes (Signed)
Patient ID: Theresa [name], female    DOB: 1956/02/25, 65 y.o.   MRN: [id number]  HPI Theresa [name] is a 65 y/o female with a history of chronic low back pain, HTN, depression, COPD on long-term oxygen, anxiety, carpal tunnel syndrome, long-standing tobacco use and chronic heart failure.  Echo report from 09/03/20 reviewed and showed an EF of >55% with mild LAE and severe TR. Echo report from 07/07/2019 reviewed and showed an EF of 60-65% along with mild TR and moderately elevated PA pressure. Echo done 11/03/15 but unable to access results. Echo done on 02/19/15 showed an EF of 50% without valvular stenosis.   Catheterization done 07/11/2019 showed:   Prox RCA to Dist RCA lesion is 50% stenosed.  Mid LAD lesion is 50% stenosed.  Ost LM to Dist LM lesion is 10% stenosed.  Hemodynamic findings consistent with moderate pulmonary hypertension.  Normal overall left ventricular function of at least 60%  Moderate coronary disease no significant obstructive disease  Moderate pulmonary hypertension  Was in the ED 06/26/20 due to shortness of breath. Chest CT negative for PE. Treated for COPD exacerbation and she was released.   She presents today for a follow-up visit with a chief complaint of moderate shortness of breath upon minimal exertion. She describes this as chronic in nature having been present for several years although she feels like it has worsened lately. She has associated fatigue, cough, wheezing, difficulty sleeping and chronic back pain along with this. She denies any dizziness, abdominal distention, palpitations, pedal edema, chest pain or weight gain.   Scheduled for trigger finger release next week along with having stress test/ echo next week as well.   Past Medical History:  Diagnosis Date   Acute postoperative pain 10/03/2017   Anxiety    BiPAP (biphasic positive airway pressure) dependence    at hs   Carpal tunnel syndrome    CHF (congestive heart failure) ([place])     1/18   CHF (congestive heart failure) ([facility])    Chronic generalized abdominal pain    Chronic rhinitis    COPD (chronic obstructive pulmonary disease) ([facility])    DDD (degenerative disc disease), cervical    DDD (degenerative disc disease), lumbosacral    Depression    Dyspnea    Edema    Flu    1/18   Gastritis    GERD (gastroesophageal reflux disease)    Hematuria    Hernia of abdominal wall 07/17/2018   Hernia, abdominal    Hypertension    Kidney stones    Low back pain    Lumbar radiculitis    Malodorous urine    Muscle weakness    Obesity    Oxygen dependent    Renal cyst    Sensory urge incontinence    Thyroid activity decreased    9/19   Tobacco abuse    Wheezing    Past Surgical History:  Procedure Laterality Date   ABDOMINAL HYSTERECTOMY     CARPAL TUNNEL RELEASE Left 2012   EPIGASTRIC HERNIA REPAIR N/A 08/13/2018   HERNIA REPAIR EPIGASTRIC ADULT; polypropylene mesh reinforcement.  Surgeon: Robert Bellow, MD;  Location: [facility];  Service: General;  Laterality: N/A;   RIGHT/LEFT HEART CATH AND CORONARY ANGIOGRAPHY N/A 07/11/2019   Procedure: RIGHT/LEFT HEART CATH AND CORONARY ANGIOGRAPHY;  Surgeon: Yolonda Kida, MD;  Location: [facility];  Service: Cardiovascular;  Laterality: N/A;   TONSILLECTOMY     TUBAL LIGATION     UMBILICAL  HERNIA REPAIR N/A 08/13/2018   HERNIA REPAIR UMBILICAL ADULT; polypropylene mesh reinforcement Surgeon: Robert Bellow, MD;  Location: [facility];  Service: General;  Laterality: N/A;   Family History  Problem Relation Age of Onset   Heart disease Mother    Stroke Mother    Coronary artery disease Mother    Lung cancer Sister    Cancer Sister    Breast cancer Sister    Alcohol abuse Father    Heart disease Father    Cancer Brother    Cancer Brother    Pneumonia Brother    Prostate cancer Neg Hx    Kidney cancer Neg Hx    Bladder Cancer Neg Hx    Social  History   Tobacco Use   Smoking status: Current Every Day Smoker    Packs/day: 0.50    Years: 44.00    Pack years: 22.00    Types: Cigarettes   Smokeless tobacco: Never Used   Tobacco comment: over a half pack  Substance Use Topics   Alcohol use: No   Allergies  Allergen Reactions   Codeine Nausea And Vomiting   Meloxicam Other (See Comments)    Stomach pain   Prior to Admission medications   Medication Sig Start Date End Date Taking? Authorizing Provider  albuterol (VENTOLIN HFA) 108 (90 Base) MCG/ACT inhaler Inhale 2 puffs into the lungs every 6 (six) hours as needed for wheezing or shortness of breath. 10/23/19  Yes Alfred Levins, [place], MD  aspirin EC 81 MG tablet Take 81 mg by mouth daily.   Yes [provider]  baclofen (LIORESAL) 10 MG tablet Take 1 tablet (10 mg total) by mouth 3 (three) times daily. 07/11/20 01/07/21 Yes Milinda Pointer, MD  Cholecalciferol 50 MCG (2000 UT) CAPS Take 2,000 Units by mouth daily.    Yes [provider]  digoxin (LANOXIN) 0.25 MG tablet Take 1 tablet (0.25 mg total) by mouth daily. 08/31/20  Yes Chrismon, Vickki Muff, PA-C  Fluticasone-Umeclidin-Vilant 100-62.5-25 MCG/INH AEPB Inhale 1 puff into the lungs daily. 10/03/18  Yes [provider]  gabapentin (NEURONTIN) 400 MG capsule Take 1 capsule (400 mg total) by mouth 3 (three) times daily. 07/11/20 01/07/21 Yes Milinda Pointer, MD  HYDROcodone-acetaminophen (NORCO/VICODIN) 5-325 MG tablet Take 1 tablet by mouth every 8 (eight) hours as needed for severe pain. Must last 30 days 11/06/20 12/06/20 Yes Milinda Pointer, MD  HYDROcodone-acetaminophen (NORCO/VICODIN) 5-325 MG tablet Take 1 tablet by mouth every 8 (eight) hours as needed for severe pain. Must last 30 days 12/06/20 01/05/21 Yes Milinda Pointer, MD  HYDROcodone-acetaminophen (NORCO/VICODIN) 5-325 MG tablet Take 1 tablet by mouth every 8 (eight) hours as needed for severe pain. Must last 30 days 01/05/21 02/04/21  Yes Milinda Pointer, MD  ipratropium-albuterol (DUONEB) 0.5-2.5 (3) MG/3ML SOLN USE 1 VIAL VIA NEBULIZER EVERY 6 HOURS AS NEEDED FOR SHORTNESS OF BREATH OR WHEEZING 10/14/19  Yes Chrismon, Vickki Muff, PA-C  levothyroxine (SYNTHROID) 25 MCG tablet TAKE 1 TABLET(25 MCG) BY MOUTH DAILY BEFORE BREAKFAST 06/20/20  Yes Chrismon, Vickki Muff, PA-C  OXYGEN Inhale 2.5 L/min into the lungs.   Yes [provider]  potassium chloride SA (KLOR-CON) 20 MEQ tablet TAKE 1 TABLET(20 MEQ) BY MOUTH TWICE DAILY 09/15/19  Yes Darylene Price A, FNP  sertraline (ZOLOFT) 100 MG tablet TAKE 2 TABLETS BY MOUTH EVERY MORNING 11/18/20  Yes Chrismon, Vickki Muff, PA-C  torsemide (DEMADEX) 20 MG tablet Take 2 tablets (40 mg total) by mouth 2 (two) times daily. 05/21/20  Yes Darylene Price A, FNP  Treprostinil (TYVASO) 0.6 MG/ML SOLN Inhale 18 mcg into the lungs 4 (four) times daily.   Yes [provider]  zaleplon (SONATA) 5 MG capsule Take 5 mg by mouth at bedtime as needed for sleep.   Yes [provider]    Review of Systems  Constitutional: Positive for fatigue. Negative for appetite change.  HENT: Negative for congestion, postnasal drip and sore throat.   Eyes: Negative.   Respiratory: Positive for cough, shortness of breath and wheezing (at times). Negative for chest tightness.   Cardiovascular: Negative for chest pain, palpitations and leg swelling.  Gastrointestinal: Negative for abdominal distention, abdominal pain and nausea.  Endocrine: Negative.   Genitourinary: Negative.   Musculoskeletal: Positive for back pain. Negative for neck pain.  Skin: Negative.   Allergic/Immunologic: Negative.   Neurological: Negative for dizziness and light-headedness.  Hematological: Negative for adenopathy. Does not bruise/bleed easily.  Psychiatric/Behavioral: Positive for sleep disturbance (wearing oxygen & bipap at night). Negative for agitation, dysphoric mood and suicidal ideas. The patient is nervous/anxious.     Vitals:   11/25/20 1109 11/25/20 1124  BP: (!) 123/55   Pulse: 76   Resp: 18   SpO2: (!) 82% 90%  Weight: 193 lb (87.5 kg)   Height: _0  (1.575 m)    Wt Readings from Last 3 Encounters:  11/25/20 193 lb (87.5 kg)  10/28/20 187 lb (84.8 kg)  08/03/20 192 lb (87.1 kg)   Lab Results  Component Value Date   CREATININE 0.84 06/26/2020   CREATININE 0.83 06/25/2020   CREATININE 0.75 03/04/2020    Physical Exam Vitals and nursing note reviewed.  Constitutional:      Appearance: She is well-developed.  HENT:     Head: Normocephalic and atraumatic.  Neck:     Vascular: No JVD.  Cardiovascular:     Rate and Rhythm: Normal rate and regular rhythm.  Pulmonary:     Effort: Pulmonary effort is normal.     Breath sounds: No wheezing, rhonchi or rales.  Abdominal:     General: There is no distension.     Palpations: Abdomen is soft.     Tenderness: There is no abdominal tenderness.  Musculoskeletal:        General: No tenderness.     Cervical back: Normal range of motion and neck supple.  Skin:    General: Skin is warm and dry.  Neurological:     Mental Status: She is alert and oriented to person, place, and time.  Psychiatric:        Mood and Affect: Mood normal. Mood is not depressed.        Behavior: Behavior normal.        Thought Content: Thought content normal.       Assessment & Plan:  1: Chronic heart failure with preserved ejection fraction without structural changes- - NYHA class III - euvolemic today - continues to weigh daily at home. Reminded to call for an overnight weight gain of >2 pounds or a weekly weight gain of >5 pounds - weight down 4 pounds from last visit here 6 months ago - has lost ~ 60 pounds due to following a keto diet and reading food labels  - monitor sodium content carefully - had telemedicine visit with cardiologist Clayborn Bigness) 11/06/20  - she says that she has an echo and stress test scheduled for next week - has received 1 dose of  [employer] covid vaccine - BNP  02/03/20 was 190.0  2: HTN- - BP looks good today - had video visit with PCP (Chrismon) 09/18/20 - BMP from 06/26/20 reviewed and showed sodium 140, potassium 4.0, creatinine 0.84 and GFR >60  3: COPD- - wearing bipap nightly - oxygen at 2.5-3L mostly around the clock right now - last saw pulmonology Raul Del) 09/03/20 & returns next week  4: Tobacco use- - alternates between the lozenges, gum and actual cigarettes - removes herself from the oxygen when smoking - complete cessation discussed for 3 minutes with her   Patient did not bring her medications nor a list. Each medication was verbally reviewed with the patient and she was encouraged to bring the bottles to every visit to confirm accuracy of list.  Return in 6 months or sooner for any questions/problems before then.

## 2020-11-25 ENCOUNTER — Ambulatory Visit: Payer: Medicare HMO | Attending: Family | Admitting: Family

## 2020-11-25 ENCOUNTER — Other Ambulatory Visit: Payer: Self-pay

## 2020-11-25 ENCOUNTER — Encounter: Payer: Self-pay | Admitting: Family

## 2020-11-25 VITALS — BP 123/55 | HR 76 | Resp 18 | Ht 62.0 in | Wt 193.0 lb

## 2020-11-25 DIAGNOSIS — G479 Sleep disorder, unspecified: Secondary | ICD-10-CM | POA: Diagnosis not present

## 2020-11-25 DIAGNOSIS — Z888 Allergy status to other drugs, medicaments and biological substances status: Secondary | ICD-10-CM | POA: Diagnosis not present

## 2020-11-25 DIAGNOSIS — G8929 Other chronic pain: Secondary | ICD-10-CM | POA: Diagnosis not present

## 2020-11-25 DIAGNOSIS — J449 Chronic obstructive pulmonary disease, unspecified: Secondary | ICD-10-CM | POA: Insufficient documentation

## 2020-11-25 DIAGNOSIS — Z720 Tobacco use: Secondary | ICD-10-CM

## 2020-11-25 DIAGNOSIS — Z8249 Family history of ischemic heart disease and other diseases of the circulatory system: Secondary | ICD-10-CM | POA: Insufficient documentation

## 2020-11-25 DIAGNOSIS — I5032 Chronic diastolic (congestive) heart failure: Secondary | ICD-10-CM | POA: Insufficient documentation

## 2020-11-25 DIAGNOSIS — I110 Hypertensive heart disease with heart failure: Secondary | ICD-10-CM | POA: Insufficient documentation

## 2020-11-25 DIAGNOSIS — R0602 Shortness of breath: Secondary | ICD-10-CM | POA: Diagnosis not present

## 2020-11-25 DIAGNOSIS — Z885 Allergy status to narcotic agent status: Secondary | ICD-10-CM | POA: Insufficient documentation

## 2020-11-25 DIAGNOSIS — I10 Essential (primary) hypertension: Secondary | ICD-10-CM

## 2020-11-25 DIAGNOSIS — Z9981 Dependence on supplemental oxygen: Secondary | ICD-10-CM | POA: Insufficient documentation

## 2020-11-25 DIAGNOSIS — M549 Dorsalgia, unspecified: Secondary | ICD-10-CM | POA: Insufficient documentation

## 2020-11-25 DIAGNOSIS — Z7982 Long term (current) use of aspirin: Secondary | ICD-10-CM | POA: Diagnosis not present

## 2020-11-25 DIAGNOSIS — F17210 Nicotine dependence, cigarettes, uncomplicated: Secondary | ICD-10-CM | POA: Insufficient documentation

## 2020-11-25 DIAGNOSIS — Z79899 Other long term (current) drug therapy: Secondary | ICD-10-CM | POA: Diagnosis not present

## 2020-11-25 NOTE — Patient Instructions (Addendum)
Continue weighing daily and call for an overnight weight gain of > 2 pounds or a weekly weight gain of >5 pounds. 

## 2020-11-26 ENCOUNTER — Other Ambulatory Visit: Payer: Self-pay | Admitting: Family

## 2020-11-30 ENCOUNTER — Other Ambulatory Visit: Payer: Self-pay | Admitting: Family Medicine

## 2020-11-30 DIAGNOSIS — I208 Other forms of angina pectoris: Secondary | ICD-10-CM | POA: Diagnosis not present

## 2020-11-30 DIAGNOSIS — I2510 Atherosclerotic heart disease of native coronary artery without angina pectoris: Secondary | ICD-10-CM | POA: Diagnosis not present

## 2020-12-01 DIAGNOSIS — I517 Cardiomegaly: Secondary | ICD-10-CM | POA: Diagnosis not present

## 2020-12-01 DIAGNOSIS — R0602 Shortness of breath: Secondary | ICD-10-CM | POA: Diagnosis not present

## 2020-12-01 DIAGNOSIS — R0600 Dyspnea, unspecified: Secondary | ICD-10-CM | POA: Diagnosis not present

## 2020-12-02 DIAGNOSIS — M65332 Trigger finger, left middle finger: Secondary | ICD-10-CM | POA: Diagnosis not present

## 2020-12-09 DIAGNOSIS — J449 Chronic obstructive pulmonary disease, unspecified: Secondary | ICD-10-CM | POA: Diagnosis not present

## 2020-12-15 ENCOUNTER — Telehealth: Payer: Self-pay

## 2020-12-15 NOTE — Telephone Encounter (Signed)
  Chronic Care Management   Outreach Note  12/15/2020 Name: Fayrene Towner MRN: [id number] DOB: 04/20/1956  Primary Care Provider: Margo Common, PA-C Reason for referral : Chronic Care Management   An unsuccessful telephone outreach was attempted today. Ms. Segall is currently enrolled in the Chronic Care Management program.   Follow Up Plan:  A HIPAA compliant voice message was left today requesting a return call.    [facility] [phone]

## 2020-12-17 ENCOUNTER — Other Ambulatory Visit: Payer: Self-pay | Admitting: Family Medicine

## 2020-12-18 ENCOUNTER — Other Ambulatory Visit: Payer: Self-pay | Admitting: Pain Medicine

## 2020-12-18 DIAGNOSIS — M79652 Pain in left thigh: Secondary | ICD-10-CM

## 2020-12-20 DIAGNOSIS — J9610 Chronic respiratory failure, unspecified whether with hypoxia or hypercapnia: Secondary | ICD-10-CM | POA: Diagnosis not present

## 2021-01-04 ENCOUNTER — Other Ambulatory Visit: Payer: Self-pay | Admitting: Pain Medicine

## 2021-01-04 ENCOUNTER — Other Ambulatory Visit: Payer: Self-pay | Admitting: *Deleted

## 2021-01-04 ENCOUNTER — Telehealth: Payer: Self-pay | Admitting: Student in an Organized Health Care Education/Training Program

## 2021-01-04 DIAGNOSIS — G894 Chronic pain syndrome: Secondary | ICD-10-CM

## 2021-01-04 MED ORDER — HYDROCODONE-ACETAMINOPHEN 5-325 MG PO TABS: 1.0000 | ORAL_TABLET | Freq: Three times a day (TID) | ORAL | 0 refills | Status: DC | PRN

## 2021-01-04 NOTE — Telephone Encounter (Addendum)
[facility] She is out of medication I ask patient to find a phamacy that does have her meds and to call back.   Patient called back / [facility] in [place]

## 2021-01-04 NOTE — Telephone Encounter (Signed)
Sent message to Dr Dossie Arbour to see if he will resend Rx to [facility] as [facility] not [facility] in [facility].

## 2021-01-04 NOTE — Progress Notes (Signed)
Pharmacy did not have the patient's prescription medication that he had to be sent to a different pharmacy.

## 2021-01-05 ENCOUNTER — Telehealth: Payer: Self-pay | Admitting: *Deleted

## 2021-01-05 NOTE — Telephone Encounter (Signed)
Called [facility] [place] to [facility] last remaining Rx as they did not have supply and the Rx was resent to [facility] in [place].

## 2021-01-05 NOTE — Telephone Encounter (Signed)
Voicemail left with patient that hydrocodone was resent to [facility] in [place].

## 2021-01-06 DIAGNOSIS — J449 Chronic obstructive pulmonary disease, unspecified: Secondary | ICD-10-CM | POA: Diagnosis not present

## 2021-01-15 ENCOUNTER — Telehealth: Payer: Medicare HMO

## 2021-01-15 ENCOUNTER — Telehealth: Payer: Self-pay

## 2021-01-15 NOTE — Telephone Encounter (Signed)
  Chronic Care Management   Outreach Note  01/15/2021 Name: Theresa [name] MRN: [id number] DOB: [birth year]  Primary Care Provider: Margo Common, PA-C Reason for referral : Chronic Care Management   An unsuccessful telephone outreach was attempted today. [name] is enrolled in the chronic care management program. A routine outreach was attempted today.     Follow Up Plan:  A HIPAA compliant voice message was left today requesting a return call.    [facility] Care [name] [phone]

## 2021-01-20 DIAGNOSIS — J9610 Chronic respiratory failure, unspecified whether with hypoxia or hypercapnia: Secondary | ICD-10-CM | POA: Diagnosis not present

## 2021-01-25 NOTE — Progress Notes (Addendum)
PROVIDER NOTE: Information contained herein reflects review and annotations entered in association with encounter. Interpretation of such information and data should be left to medically-trained personnel. Information provided to patient can be located elsewhere in the medical record under "Patient Instructions". Document created using STT-dictation technology, any transcriptional errors that may result from process are unintentional.    Patient: Theresa [name]  Service Category: E/M  Provider: Gaspar Cola, MD  DOB: July 28, 1956  DOS: 01/27/2021  Specialty: Interventional Pain Management  MRN: [id number]  Setting: Ambulatory outpatient  PCP: Margo Common, PA-C  Type: Established Patient    Referring Provider: Margo Common, PA-C  Location: Office  Delivery: Face-to-face     HPI  Theresa [name], a 65 y.o. year old female, is here today because of her Chronic pain syndrome [G89.4]. Theresa [name] primary complain today is Back Pain Last encounter: My last encounter with her was on 12/18/2020. Pertinent problems: Theresa [name] has Tendinitis; Neuritis or radiculitis due to rupture of lumbar intervertebral disc; Decreased motor strength; DDD (degenerative disc disease), lumbar; Carpal tunnel syndrome; Neurogenic pain; Neuropathic pain; Musculoskeletal pain; Lumbar spondylosis (L4-5); Chronic low back pain (Primary Area of Pain) (Bilateral) (L>R); Chronic lower extremity pain (Secondary Area of Pain) (Left); Chronic lumbar radicular pain (L4 & S1 Dermatome) (Left); Chronic shoulder pain (Right side); Chronic upper extremity pain (Right-sided); Cervical radiculitis (Right side); Abnormal x-ray of lumbar spine; Chronic pain syndrome (significant psychosocial component); 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; 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); Lumbar facet syndrome (Bilateral) (L>R); Chronic sacroiliac joint pain (Bilateral); Lumbar facet hypertrophy (multilevel);  Lumbar spinal stenosis (L4-5); 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); 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); Lumbosacral radiculopathy at L4; Chronic shoulder pain (Left); Arthralgia of acromioclavicular joint (Right); Acromioclavicular joint DJD (Right); Osteoarthritis of shoulder (Right); Chronic hip pain (Tertiary Area of Pain) (Bilateral) (L>R); Chronic lower extremity pain (Bilateral) (L>R); Cervicalgia (Bilateral) (R>L); Spondylosis without myelopathy or radiculopathy, lumbosacral region; Chronic knee pain (Left); Chronic lumbar radiculopathy/radiculitis (L2) (Left); Abnormal MRI, lumbar spine (07/23/2019); Protruded lumbar disc (L2-3) (Left); Numbness and tingling in right hand; Numbness in feet; Weakness of both lower extremities; Generalized muscle weakness; and Left thigh pain on their pertinent problem list. Pain Assessment: Severity of Chronic pain is reported as a 7 /10. Location: Back Right,Left/pain radiaities donw both leg, left leg is the worst. Onset: More than a month ago. Quality: Stabbing,Squeezing,Sharp,Jabbing,Constant,Aching. Timing: Constant. Modifying factor(s): meds, sit down. Vitals:  height is _0  (1.575 m) and weight is 182 lb (82.6 kg). Her temperature is 97.3 F (36.3 C) (abnormal). Her blood pressure is 106/82 and her pulse is 79. Her oxygen saturation is 97%.   Reason for encounter: medication management.   The patient indicates doing well with the current medication regimen. No adverse reactions or side effects reported to the medications.   RTCB: 05/05/2021 Nonopioids transferred 08/03/2020: Gabapentin and baclofen  Pharmacotherapy Assessment   Analgesic: Hydrocodone/APAP 5/325 one tablet every 8 hours (15 mg/day of hydrocodone) MME/day:15 mg/day.   Monitoring: [place] PMP: PDMP reviewed during this encounter.       Pharmacotherapy: No side-effects or adverse reactions reported. Compliance: No problems identified. Effectiveness: Clinically  acceptable.  Chauncey Fischer, RN  01/27/2021 11:50 AM  Signed Nursing Pain Medication Assessment:  Safety precautions to be maintained throughout the outpatient stay will include: orient to surroundings, keep bed in low position, maintain call bell within reach at all times, provide assistance with transfer out of bed and  ambulation.  Medication Inspection Compliance: Pill count conducted under aseptic conditions, in front of the patient. Neither the pills nor the bottle was removed from the patient's sight at any time. Once count was completed pills were immediately returned to the patient in their original bottle.  Medication: Hydrocodone/APAP Pill/Patch Count: 26 of 90 pills remain Pill/Patch Appearance: Markings consistent with prescribed medication Bottle Appearance: Standard pharmacy container. Clearly labeled. Filled Date: 3 / 51 / 22 Last Medication intake:  TodaySafety precautions to be maintained throughout the outpatient stay will include: orient to surroundings, keep bed in low position, maintain call bell within reach at all times, provide assistance with transfer out of bed and ambulation.     UDS:  Summary  Date Value Ref Range Status  04/14/2020 Note  Final    Comment:    ==================================================================== [facility] 13 (MW) ==================================================================== Test                             Result       Flag       Units  Drug Present and Declared for Prescription Verification   Hydrocodone                    2142         EXPECTED   ng/mg creat   Hydromorphone                  263          EXPECTED   ng/mg creat   Dihydrocodeine                 156          EXPECTED   ng/mg creat   Norhydrocodone                 2151         EXPECTED   ng/mg creat    Sources of hydrocodone include scheduled prescription medications.    Hydromorphone, dihydrocodeine and norhydrocodone are expected    metabolites of  hydrocodone. Hydromorphone and dihydrocodeine are    also available as scheduled prescription medications.  ==================================================================== Test                      Result    Flag   Units      Ref Range   Creatinine              43               mg/dL      >=20 ==================================================================== Declared Medications:  The flagging and interpretation on this report are based on the  following declared medications.  Unexpected results may arise from  inaccuracies in the declared medications.   **Note: The testing scope of this panel includes these medications:   Hydrocodone (Norco)   **Note: The testing scope of this panel does not include the  following reported medications:   Acetaminophen (Norco)  Albuterol (Ventolin HFA)  Albuterol (Duoneb)  Aspirin  Baclofen (Lioresal)  Cholecalciferol  Digoxin (Lanoxin)  Fluticasone  Gabapentin (Neurontin)  Ipratropium (Duoneb)  Levothyroxine (Synthroid)  Potassium (Klor-Con)  Sertraline (Zoloft)  Torsemide (Demadex)  Umeclidinium  Vilanterol ==================================================================== For clinical consultation, please call [phone]. ====================================================================      ROS  Constitutional: Denies any fever or chills Gastrointestinal: No reported hemesis, hematochezia, vomiting, or acute GI distress Musculoskeletal: Denies any acute onset  joint swelling, redness, loss of ROM, or weakness Neurological: No reported episodes of acute onset apraxia, aphasia, dysarthria, agnosia, amnesia, paralysis, loss of coordination, or loss of consciousness  Medication Review  Cholecalciferol, Fluticasone-Umeclidin-Vilant, HYDROcodone-acetaminophen, Oxygen-Helium, Roflumilast, albuterol, aspirin EC, baclofen, digoxin, gabapentin, ipratropium-albuterol, levothyroxine, potassium chloride SA, sertraline,  torsemide, and zaleplon  History Review  Allergy: Theresa [name] is allergic to codeine and meloxicam. Drug: Theresa [name]  reports no history of drug use. Alcohol:  reports no history of alcohol use. Tobacco:  reports that she has been smoking cigarettes. She has a 22.00 pack-year smoking history. She has never used smokeless tobacco. Social: Theresa [name]  reports that she has been smoking cigarettes. She has a 22.00 pack-year smoking history. She has never used smokeless tobacco. She reports that she does not drink alcohol and does not use drugs. Medical:  has a past medical history of Acute postoperative pain (10/03/2017), Anxiety, BiPAP (biphasic positive airway pressure) dependence, Carpal tunnel syndrome, CHF (congestive heart failure) ([place]), CHF (congestive heart failure) ([place]), Chronic generalized abdominal pain, Chronic rhinitis, COPD (chronic obstructive pulmonary disease) ([place]), DDD (degenerative disc disease), cervical, DDD (degenerative disc disease), lumbosacral, Depression, Dyspnea, Edema, Flu, Gastritis, GERD (gastroesophageal reflux disease), Hematuria, Hernia of abdominal wall (07/17/2018), Hernia, abdominal, Hypertension, Kidney stones, Low back pain, Lumbar radiculitis, Malodorous urine, Muscle weakness, Obesity, Oxygen dependent, Renal cyst, Sensory urge incontinence, Thyroid activity decreased, Tobacco abuse, and Wheezing. Surgical: Theresa [name]  has a past surgical history that includes Abdominal hysterectomy; Tonsillectomy; Carpal tunnel release (Left, 2012); Tubal ligation; epigastric hernia repair (N/A, 08/13/2018); Umbilical hernia repair (N/A, 08/13/2018); and RIGHT/LEFT HEART CATH AND CORONARY ANGIOGRAPHY (N/A, 07/11/2019). Family: family history includes Alcohol abuse in her father; Breast cancer in her sister; Cancer in her brother, brother, and sister; Coronary artery disease in her mother; Heart disease in her father and mother; Lung cancer in her sister; Pneumonia in her brother;  Stroke in her mother.  Laboratory Chemistry Profile   Renal Lab Results  Component Value Date   BUN 18 06/26/2020   CREATININE 0.84 06/26/2020   BCR 16 03/04/2020   GFRAA >60 06/26/2020   GFRNONAA >60 06/26/2020     Hepatic Lab Results  Component Value Date   AST 21 06/25/2020   ALT 22 06/25/2020   ALBUMIN 3.9 06/25/2020   ALKPHOS 94 06/25/2020   LIPASE 31 10/17/2017   AMMONIA 40 (H) 10/11/2019     Electrolytes Lab Results  Component Value Date   NA 140 06/26/2020   K 4.0 06/26/2020   CL 101 06/26/2020   CALCIUM 9.2 06/26/2020   MG 2.7 (H) 02/10/2020   PHOS 4.0 02/10/2020     Bone Lab Results  Component Value Date   25OHVITD1 13 (L) 09/17/2015   25OHVITD2 <1.0 09/17/2015   25OHVITD3 13 09/17/2015     Inflammation (CRP: Acute Phase) (ESR: Chronic Phase) Lab Results  Component Value Date   CRP 4.2 (H) 02/08/2020   ESRSEDRATE 16 08/08/2017   LATICACIDVEN 1.4 02/03/2020       Note: Above Lab results reviewed.  Recent Imaging Review  MM 3D SCREEN BREAST BILATERAL CLINICAL DATA:  Screening.  EXAM: DIGITAL SCREENING BILATERAL MAMMOGRAM WITH TOMO AND CAD  COMPARISON:  Previous exam(s).  ACR Breast Density Category b: There are scattered areas of fibroglandular density.  FINDINGS: There are no findings suspicious for malignancy. The images were evaluated with computer-aided detection.  IMPRESSION: No mammographic evidence of malignancy. A result letter of this screening mammogram will be mailed directly to the  patient.  RECOMMENDATION: Screening mammogram in one year. (Code:[id number]-01Y)  BI-RADS CATEGORY  1: Negative.  Electronically Signed   By: Zerita Boers M.D.   On: 11/18/2020 16:15 Note: Reviewed        Physical Exam  General appearance: Well nourished, well developed, and well hydrated. In no apparent acute distress Mental status: Alert, oriented x 3 (person, place, & time)       Respiratory: No evidence of acute respiratory  distress Eyes: PERLA Vitals: BP 106/82   Pulse 79   Temp (!) 97.3 F (36.3 C)   Ht _0  (1.575 m)   Wt 182 lb (82.6 kg)   SpO2 97% Comment: 2 1/2  BMI 33.29 kg/m  BMI: Estimated body mass index is 33.29 kg/m as calculated from the following:   Height as of this encounter: _1  (1.575 m).   Weight as of this encounter: 182 lb (82.6 kg). Ideal: Ideal body weight: 50.1 kg (110 lb 7.2 oz) Adjusted ideal body weight: 63.1 kg (139 lb 1.1 oz)  Assessment   Status Diagnosis  Controlled Controlled Controlled 1. Chronic pain syndrome (significant psychosocial component)   2. Chronic low back pain (Primary Area of Pain) (Bilateral) (L>R)   3. Lumbar facet syndrome (Bilateral) (L>R)   4. Chronic lower extremity pain (Secondary Area of Pain) (Left)   5. Chronic hip pain (Tertiary Area of Pain) (Bilateral) (L>R)   6. Left thigh pain   7. Pharmacologic therapy   8. Chronic use of opiate for therapeutic purpose   9. Uncomplicated opioid dependence ([place])      Updated Problems: Problem  Body Mass Index (Bmi) 40.0-44.9, Adult ([facility])    Plan of Care  Problem-specific:  No problem-specific Assessment & Plan notes found for this encounter.  Theresa [name] has a current medication list which includes the following long-term medication(s): albuterol, digoxin, [START ON 02/04/2021] hydrocodone-acetaminophen, [START ON 03/06/2021] hydrocodone-acetaminophen, [START ON 04/05/2021] hydrocodone-acetaminophen, ipratropium-albuterol, levothyroxine, potassium chloride sa, sertraline, torsemide, zaleplon, baclofen, and gabapentin.  Pharmacotherapy (Medications Ordered): Meds ordered this encounter  Medications  . HYDROcodone-acetaminophen (NORCO/VICODIN) 5-325 MG tablet    Sig: Take 1 tablet by mouth every 8 (eight) hours as needed for severe pain. Must last 30 days    Dispense:  90 tablet    Refill:  0    Not a duplicate. Do NOT delete! Dispense 1 day early if closed on refill date. Avoid  benzodiazepines within 8 hours of opioids. Do not send refill requests.  Marland Kitchen HYDROcodone-acetaminophen (NORCO/VICODIN) 5-325 MG tablet    Sig: Take 1 tablet by mouth every 8 (eight) hours as needed for severe pain. Must last 30 days    Dispense:  90 tablet    Refill:  0    Not a duplicate. Do NOT delete! Dispense 1 day early if closed on refill date. Avoid benzodiazepines within 8 hours of opioids. Do not send refill requests.  Marland Kitchen HYDROcodone-acetaminophen (NORCO/VICODIN) 5-325 MG tablet    Sig: Take 1 tablet by mouth every 8 (eight) hours as needed for severe pain. Must last 30 days    Dispense:  90 tablet    Refill:  0    Not a duplicate. Do NOT delete! Dispense 1 day early if closed on refill date. Avoid benzodiazepines within 8 hours of opioids. Do not send refill requests.   Orders:  Orders Placed This Encounter  Procedures  . LUMBAR FACET(MEDIAL BRANCH NERVE BLOCK) MBNB    Standing Status:   Future    Standing  Expiration Date:   02/26/2021    Scheduling Instructions:     Procedure: Lumbar facet block (AKA.: Lumbosacral medial branch nerve block)     Side: Bilateral     Level: L3-4, L4-5, & L5-S1 Facets (L2, L3, L4, L5, & S1 Medial Branch Nerves)     Sedation: Patient's choice.     Timeframe: ASAA    Order Specific Question:   Where will this procedure be performed?    Answer:   [facility]   Follow-up plan:   Return for Procedure (w/ sedation): (B) L-FCT BLK.      Interventional management options: Planned, scheduled, and/or pending: NOTE: NO further Lumbar RFAuntil BMI <35. Diagnostic left L2-3 LESI #1 today   Considering: Palliative procedures    Palliative PRN treatment(s): Palliativebilaterallumbar facetblock #4 Diagnostic/palliative left L4TFESI #2 Diagnostic/palliative left L4-5 LESI #3 Diagnostic/palliativerightL4 TFESI #2 Diagnostic/palliativerightL4-5 LESI #2 Palliative leftlumbar facet RFA#2(done on 06/27/2017) Palliative right  lumbar facet RFA#2(done on 10/03/2017)    Recent Visits No visits were found meeting these conditions. Showing recent visits within past 90 days and meeting all other requirements Today's Visits Date Type Provider Dept  01/27/21 Office Visit Milinda Pointer, MD [facility]  Showing today's visits and meeting all other requirements Future Appointments No visits were found meeting these conditions. Showing future appointments within next 90 days and meeting all other requirements  I discussed the assessment and treatment plan with the patient. The patient was provided an opportunity to ask questions and all were answered. The patient agreed with the plan and demonstrated an understanding of the instructions.  Patient advised to call back or seek an in-person evaluation if the symptoms or condition worsens.  Duration of encounter: 30 minutes.  Note by: Gaspar Cola, MD Date: 01/27/2021; Time: 11:58 AM

## 2021-01-26 DIAGNOSIS — Z79891 Long term (current) use of opiate analgesic: Secondary | ICD-10-CM | POA: Insufficient documentation

## 2021-01-27 ENCOUNTER — Other Ambulatory Visit: Payer: Self-pay

## 2021-01-27 ENCOUNTER — Ambulatory Visit: Payer: Medicare HMO | Attending: Pain Medicine | Admitting: Pain Medicine

## 2021-01-27 ENCOUNTER — Encounter: Payer: Self-pay | Admitting: Pain Medicine

## 2021-01-27 VITALS — BP 106/82 | HR 79 | Temp 97.3°F | Ht 62.0 in | Wt 182.0 lb

## 2021-01-27 DIAGNOSIS — Z79891 Long term (current) use of opiate analgesic: Secondary | ICD-10-CM | POA: Diagnosis not present

## 2021-01-27 DIAGNOSIS — M47816 Spondylosis without myelopathy or radiculopathy, lumbar region: Secondary | ICD-10-CM | POA: Insufficient documentation

## 2021-01-27 DIAGNOSIS — G8929 Other chronic pain: Secondary | ICD-10-CM | POA: Diagnosis not present

## 2021-01-27 DIAGNOSIS — G894 Chronic pain syndrome: Secondary | ICD-10-CM | POA: Insufficient documentation

## 2021-01-27 DIAGNOSIS — M79652 Pain in left thigh: Secondary | ICD-10-CM

## 2021-01-27 DIAGNOSIS — M25551 Pain in right hip: Secondary | ICD-10-CM | POA: Diagnosis not present

## 2021-01-27 DIAGNOSIS — M79605 Pain in left leg: Secondary | ICD-10-CM | POA: Insufficient documentation

## 2021-01-27 DIAGNOSIS — F1120 Opioid dependence, uncomplicated: Secondary | ICD-10-CM

## 2021-01-27 DIAGNOSIS — Z79899 Other long term (current) drug therapy: Secondary | ICD-10-CM | POA: Insufficient documentation

## 2021-01-27 DIAGNOSIS — M25552 Pain in left hip: Secondary | ICD-10-CM | POA: Diagnosis not present

## 2021-01-27 DIAGNOSIS — M545 Low back pain, unspecified: Secondary | ICD-10-CM | POA: Diagnosis not present

## 2021-01-27 MED ORDER — HYDROCODONE-ACETAMINOPHEN 5-325 MG PO TABS: 1.0000 | ORAL_TABLET | Freq: Three times a day (TID) | ORAL | 0 refills | Status: DC | PRN

## 2021-01-27 NOTE — Progress Notes (Signed)
Nursing Pain Medication Assessment:  Safety precautions to be maintained throughout the outpatient stay will include: orient to surroundings, keep bed in low position, maintain call bell within reach at all times, provide assistance with transfer out of bed and ambulation.  Medication Inspection Compliance: Pill count conducted under aseptic conditions, in front of the patient. Neither the pills nor the bottle was removed from the patient's sight at any time. Once count was completed pills were immediately returned to the patient in their original bottle.  Medication: Hydrocodone/APAP Pill/Patch Count: 26 of 90 pills remain Pill/Patch Appearance: Markings consistent with prescribed medication Bottle Appearance: Standard pharmacy container. Clearly labeled. Filled Date: 3 / 97 / 22 Last Medication intake:  TodaySafety precautions to be maintained throughout the outpatient stay will include: orient to surroundings, keep bed in low position, maintain call bell within reach at all times, provide assistance with transfer out of bed and ambulation.

## 2021-01-27 NOTE — Addendum Note (Signed)
Addended by: Milinda Pointer A on: 01/27/2021 11:58 AM   Modules accepted: Orders

## 2021-01-27 NOTE — Patient Instructions (Addendum)
____________________________________________________________________________________________  Preparing for Procedure with Sedation  Procedure appointments are limited to planned procedures: . No Prescription Refills. . No disability issues will be discussed. . No medication changes will be discussed.  Instructions: . Oral Intake: Do not eat or drink anything for at least 8 hours prior to your procedure. (Exception: Blood Pressure Medication. See below.) . Transportation: Unless otherwise stated by your physician, you may drive yourself after the procedure. . Blood Pressure Medicine: Do not forget to take your blood pressure medicine with a sip of water the morning of the procedure. If your Diastolic (lower reading)is above 100 mmHg, elective cases will be cancelled/rescheduled. . Blood thinners: These will need to be stopped for procedures. Notify our staff if you are taking any blood thinners. Depending on which one you take, there will be specific instructions on how and when to stop it. . Diabetics on insulin: Notify the staff so that you can be scheduled 1st case in the morning. If your diabetes requires high dose insulin, take only  of your normal insulin dose the morning of the procedure and notify the staff that you have done so. . Preventing infections: Shower with an antibacterial soap the morning of your procedure. . Build-up your immune system: Take 1000 mg of Vitamin C with every meal (3 times a day) the day prior to your procedure. Marland Kitchen Antibiotics: Inform the staff if you have a condition or reason that requires you to take antibiotics before dental procedures. . Pregnancy: If you are pregnant, call and cancel the procedure. . Sickness: If you have a cold, fever, or any active infections, call and cancel the procedure. . Arrival: You must be in the facility at least 30 minutes prior to your scheduled procedure. . Children: Do not bring children with you. . Dress appropriately:  Bring dark clothing that you would not mind if they get stained. . Valuables: Do not bring any jewelry or valuables.  Reasons to call and reschedule or cancel your procedure: (Following these recommendations will minimize the risk of a serious complication.) . Surgeries: Avoid having procedures within 2 weeks of any surgery. (Avoid for 2 weeks before or after any surgery). . Flu Shots: Avoid having procedures within 2 weeks of a flu shots or . (Avoid for 2 weeks before or after immunizations). . Barium: Avoid having a procedure within 7-10 days after having had a radiological study involving the use of radiological contrast. (Myelograms, Barium swallow or enema study). . Heart attacks: Avoid any elective procedures or surgeries for the initial 6 months after a "Myocardial Infarction" (Heart Attack). . Blood thinners: It is imperative that you stop these medications before procedures. Let us know if you if you take any blood thinner.  . Infection: Avoid procedures during or within two weeks of an infection (including chest colds or gastrointestinal problems). Symptoms associated with infections include: Localized redness, fever, chills, night sweats or profuse sweating, burning sensation when voiding, cough, congestion, stuffiness, runny nose, sore throat, diarrhea, nausea, vomiting, cold or Flu symptoms, recent or current infections. It is specially important if the infection is over the area that we intend to treat. Marland Kitchen Heart and lung problems: Symptoms that may suggest an active cardiopulmonary problem include: cough, chest pain, breathing difficulties or shortness of breath, dizziness, ankle swelling, uncontrolled high or unusually low blood pressure, and/or palpitations. If you are experiencing any of these symptoms, cancel your procedure and contact your primary care physician for an evaluation.  Remember:  Regular Business hours are:  Monday to Thursday 8:00 AM to 4:00 PM  Provider's  Schedule: Milinda Pointer, MD:  Procedure days: Tuesday and Thursday 7:30 AM to 4:00 PM  Gillis Santa, MD:  Procedure days: Monday and Wednesday 7:30 AM to 4:00 PM ____________________________________________________________________________________________   ____________________________________________________________________________________________  General Risks and Possible Complications  Patient Responsibilities: It is important that you read this as it is part of your informed consent. It is our duty to inform you of the risks and possible complications associated with treatments offered to you. It is your responsibility as a patient to read this and to ask questions about anything that is not clear or that you believe was not covered in this document.  Patient's Rights: You have the right to refuse treatment. You also have the right to change your mind, even after initially having agreed to have the treatment done. However, under this last option, if you wait until the last second to change your mind, you may be charged for the materials used up to that point.  Introduction: Medicine is not an Chief Strategy Officer. Everything in Medicine, including the lack of treatment(s), carries the potential for danger, harm, or loss (which is by definition: Risk). In Medicine, a complication is a secondary problem, condition, or disease that can aggravate an already existing one. All treatments carry the risk of possible complications. The fact that a side effects or complications occurs, does not imply that the treatment was conducted incorrectly. It must be clearly understood that these can happen even when everything is done following the highest safety standards.  No treatment: You can choose not to proceed with the proposed treatment alternative. The "PRO(s)" would include: avoiding the risk of complications associated with the therapy. The "CON(s)" would include: not getting any of the treatment  benefits. These benefits fall under one of three categories: diagnostic; therapeutic; and/or palliative. Diagnostic benefits include: getting information which can ultimately lead to improvement of the disease or symptom(s). Therapeutic benefits are those associated with the successful treatment of the disease. Finally, palliative benefits are those related to the decrease of the primary symptoms, without necessarily curing the condition (example: decreasing the pain from a flare-up of a chronic condition, such as incurable terminal cancer).  General Risks and Complications: These are associated to most interventional treatments. They can occur alone, or in combination. They fall under one of the following six (6) categories: no benefit or worsening of symptoms; bleeding; infection; nerve damage; allergic reactions; and/or death. 1. No benefits or worsening of symptoms: In Medicine there are no guarantees, only probabilities. No healthcare provider can ever guarantee that a medical treatment will work, they can only state the probability that it may. Furthermore, there is always the possibility that the condition may worsen, either directly, or indirectly, as a consequence of the treatment. 2. Bleeding: This is more common if the patient is taking a blood thinner, either prescription or over the counter (example: Goody Powders, Fish oil, Aspirin, Garlic, etc.), or if suffering a condition associated with impaired coagulation (example: [name], cirrhosis of the liver, low platelet counts, etc.). However, even if you do not have one on these, it can still happen. If you have any of these conditions, or take one of these drugs, make sure to notify your treating physician. 3. Infection: This is more common in patients with a compromised immune system, either due to disease (example: diabetes, cancer, human immunodeficiency virus [HIV], etc.), or due to medications or treatments (example: therapies used to treat  cancer and  rheumatological diseases). However, even if you do not have one on these, it can still happen. If you have any of these conditions, or take one of these drugs, make sure to notify your treating physician. 4. Nerve Damage: This is more common when the treatment is an invasive one, but it can also happen with the use of medications, such as those used in the treatment of cancer. The damage can occur to small secondary nerves, or to large primary ones, such as those in the spinal cord and brain. This damage may be temporary or permanent and it may lead to impairments that can range from temporary numbness to permanent paralysis and/or brain death. 5. Allergic Reactions: Any time a substance or material comes in contact with our body, there is the possibility of an allergic reaction. These can range from a mild skin rash (contact dermatitis) to a severe systemic reaction (anaphylactic reaction), which can result in death. 6. Death: In general, any medical intervention can result in death, most of the time due to an unforeseen complication. ____________________________________________________________________________________________  Preparing for Procedure with Sedation Instructions: . Oral Intake: Do not eat or drink anything for at least 8 hours prior to your procedure. . Transportation: Public transportation is not allowed. Bring an adult driver. The driver must be physically present in our waiting room before any procedure can be started. Marland Kitchen Physical Assistance: Bring an adult capable of physically assisting you, in the event you need help. . Blood Pressure Medicine: Take your blood pressure medicine with a sip of water the morning of the procedure. . Insulin: Take only  of your normal insulin dose. . Preventing infections: Shower with an antibacterial soap the morning of your procedure. . Build-up your immune system: Take 1000 mg of Vitamin C with every meal (3 times a day) the day prior to  your procedure. . Pregnancy: If you are pregnant, call and cancel the procedure. . Sickness: If you have a cold, fever, or any active infections, call and cancel the procedure. . Arrival: You must be in the facility at least 30 minutes prior to your scheduled procedure. . Children: Do not bring children with you. . Dress appropriately: Bring dark clothing that you would not mind if they get stained. . Valuables: Do not bring any jewelry or valuables. Procedure appointments are reserved for interventional treatments only. Marland Kitchen No Prescription Refills. . No medication changes will be discussed during procedure appointments. . No disability issues will be discussed. Marland Kitchen

## 2021-02-02 ENCOUNTER — Ambulatory Visit: Payer: Self-pay

## 2021-02-02 DIAGNOSIS — J449 Chronic obstructive pulmonary disease, unspecified: Secondary | ICD-10-CM

## 2021-02-02 DIAGNOSIS — I5032 Chronic diastolic (congestive) heart failure: Secondary | ICD-10-CM

## 2021-02-02 NOTE — Chronic Care Management (AMB) (Signed)
  Chronic Care Management   Outreach Note  02/02/2021 Name: Theresa [name] MRN: [id number] DOB: 1956/07/01   Primary Care Provider: Margo Common, PA-C Reason for referral : Chronic Care Management   [name] was referred to the care management team for assistance with chronic care management and care coordination. Her primary care provider will be notified of our unsuccessful attempts to maintain contact. The care management team will gladly outreach at any time in the future if she is interested in receiving assistance.    PLAN The care management team will gladly follow up with [name] after the primary care provider has a conversation with her regarding recommendation for care management engagement and subsequent re-referral for care management services.    Cristy [facility] [phone]

## 2021-02-04 ENCOUNTER — Emergency Department
Admission: EM | Admit: 2021-02-04 | Discharge: 2021-02-04 | Disposition: A | Discharge status: Home / Self Care | Payer: Medicare HMO | Attending: Emergency Medicine | Admitting: Emergency Medicine

## 2021-02-04 ENCOUNTER — Emergency Department: Payer: Medicare HMO

## 2021-02-04 ENCOUNTER — Other Ambulatory Visit: Payer: Self-pay

## 2021-02-04 DIAGNOSIS — Z955 Presence of coronary angioplasty implant and graft: Secondary | ICD-10-CM | POA: Diagnosis not present

## 2021-02-04 DIAGNOSIS — Z7982 Long term (current) use of aspirin: Secondary | ICD-10-CM | POA: Diagnosis not present

## 2021-02-04 DIAGNOSIS — R0602 Shortness of breath: Secondary | ICD-10-CM | POA: Diagnosis not present

## 2021-02-04 DIAGNOSIS — I110 Hypertensive heart disease with heart failure: Secondary | ICD-10-CM | POA: Insufficient documentation

## 2021-02-04 DIAGNOSIS — Z20822 Contact with and (suspected) exposure to covid-19: Secondary | ICD-10-CM | POA: Diagnosis not present

## 2021-02-04 DIAGNOSIS — I509 Heart failure, unspecified: Secondary | ICD-10-CM

## 2021-02-04 DIAGNOSIS — Z79899 Other long term (current) drug therapy: Secondary | ICD-10-CM | POA: Insufficient documentation

## 2021-02-04 DIAGNOSIS — J449 Chronic obstructive pulmonary disease, unspecified: Secondary | ICD-10-CM | POA: Insufficient documentation

## 2021-02-04 DIAGNOSIS — Z8616 Personal history of COVID-19: Secondary | ICD-10-CM | POA: Diagnosis not present

## 2021-02-04 DIAGNOSIS — I5033 Acute on chronic diastolic (congestive) heart failure: Secondary | ICD-10-CM | POA: Diagnosis not present

## 2021-02-04 DIAGNOSIS — F17210 Nicotine dependence, cigarettes, uncomplicated: Secondary | ICD-10-CM | POA: Diagnosis not present

## 2021-02-04 DIAGNOSIS — R079 Chest pain, unspecified: Secondary | ICD-10-CM | POA: Diagnosis not present

## 2021-02-04 DIAGNOSIS — R0789 Other chest pain: Secondary | ICD-10-CM | POA: Diagnosis not present

## 2021-02-04 DIAGNOSIS — D72829 Elevated white blood cell count, unspecified: Secondary | ICD-10-CM | POA: Diagnosis not present

## 2021-02-04 DIAGNOSIS — Z7951 Long term (current) use of inhaled steroids: Secondary | ICD-10-CM | POA: Insufficient documentation

## 2021-02-04 LAB — CBC WITH DIFFERENTIAL/PLATELET
Abs Immature Granulocytes: 0.09 10*3/uL — ABNORMAL HIGH (ref 0.00–0.07)
Basophils Absolute: 0.1 10*3/uL (ref 0.0–0.1)
Basophils Relative: 1 %
Eosinophils Absolute: 0.2 10*3/uL (ref 0.0–0.5)
Eosinophils Relative: 2 %
HCT: 50.3 % — ABNORMAL HIGH (ref 36.0–46.0)
Hemoglobin: 15.8 g/dL — ABNORMAL HIGH (ref 12.0–15.0)
Immature Granulocytes: 1 %
Lymphocytes Relative: 19 %
Lymphs Abs: 2.5 10*3/uL (ref 0.7–4.0)
MCH: 28.6 pg (ref 26.0–34.0)
MCHC: 31.4 g/dL (ref 30.0–36.0)
MCV: 91.1 fL (ref 80.0–100.0)
Monocytes Absolute: 0.7 10*3/uL (ref 0.1–1.0)
Monocytes Relative: 6 %
Neutro Abs: 9.3 10*3/uL — ABNORMAL HIGH (ref 1.7–7.7)
Neutrophils Relative %: 71 %
Platelets: 185 10*3/uL (ref 150–400)
RBC: 5.52 MIL/uL — ABNORMAL HIGH (ref 3.87–5.11)
RDW: 18.8 % — ABNORMAL HIGH (ref 11.5–15.5)
WBC: 12.9 10*3/uL — ABNORMAL HIGH (ref 4.0–10.5)
nRBC: 0 % (ref 0.0–0.2)

## 2021-02-04 LAB — BASIC METABOLIC PANEL
Anion gap: 10 (ref 5–15)
BUN: 14 mg/dL (ref 8–23)
CO2: 26 mmol/L (ref 22–32)
Calcium: 9.1 mg/dL (ref 8.9–10.3)
Chloride: 100 mmol/L (ref 98–111)
Creatinine, Ser: 0.8 mg/dL (ref 0.44–1.00)
GFR, Estimated: >60 mL/min (ref >60)
Glucose, Bld: 98 mg/dL (ref 70–99)
Potassium: 3.8 mmol/L (ref 3.5–5.1)
Sodium: 136 mmol/L (ref 135–145)

## 2021-02-04 LAB — TROPONIN I (HIGH SENSITIVITY)
Troponin I (High Sensitivity): 9 ng/L (ref <18)
Troponin I (High Sensitivity): 9 ng/L (ref <18)

## 2021-02-04 LAB — RESP PANEL BY RT-PCR (FLU A&B, COVID) ARPGX2
Influenza A by PCR: NEGATIVE
Influenza B by PCR: NEGATIVE
SARS Coronavirus 2 by RT PCR: NEGATIVE

## 2021-02-04 LAB — BRAIN NATRIURETIC PEPTIDE: B Natriuretic Peptide: 59.7 pg/mL (ref 0.0–100.0)

## 2021-02-04 LAB — D-DIMER, QUANTITATIVE: D-Dimer, Quant: 1.3 ug/mL-FEU — ABNORMAL HIGH (ref 0.00–0.50)

## 2021-02-04 LAB — PROCALCITONIN: Procalcitonin: <0.1 ng/mL

## 2021-02-04 MED ORDER — FUROSEMIDE 10 MG/ML IJ SOLN
80.0000 mg | Freq: Once | INTRAMUSCULAR | Status: AC
  Administered 2021-02-04: 80 mg via INTRAVENOUS
  Filled 2021-02-04: qty 8

## 2021-02-04 MED ORDER — IOHEXOL 350 MG/ML SOLN
75.0000 mL | Freq: Once | INTRAVENOUS | Status: AC | PRN
  Administered 2021-02-04: 75 mL via INTRAVENOUS

## 2021-02-04 NOTE — ED Provider Notes (Signed)
[facility] Emergency Department Provider Note  ____________________________________________  Time seen: Approximately 1:30 AM  I have reviewed the triage vital signs and the nursing notes.   HISTORY  Chief Complaint No chief complaint on file.   HPI Theresa [name] is a 65 y.o. female with a history of diastolic CHF, OSA on BiPAP, COPD, chronic respiratory failure on 2.5 L who presents for evaluation of shortness of breath.  Over the last 3 days patient has noticed progressively swelling of her hands and increased abdominal girth.  Has had orthopnea.  She is on 80 mg daily of torsemide which she endorses compliance.  She denies cough or fever.  She has had chest tightness when she tries to lay down at night and has been sleeping propped up.  She denies pleuritic chest pain, personal or family history of PE or DVT, recent travel immobilization, leg pain or swelling, hemoptysis, or exogenous hormones.  She is followed by Dr. Clayborn Bigness.  She denies any dietary indiscretions   Past Medical History:  Diagnosis Date  . Acute postoperative pain 10/03/2017  . Anxiety   . BiPAP (biphasic positive airway pressure) dependence    at hs  . Carpal tunnel syndrome   . CHF (congestive heart failure) ([place])    1/18  . CHF (congestive heart failure) ([place])   . Chronic generalized abdominal pain   . Chronic rhinitis   . COPD (chronic obstructive pulmonary disease) ([place])   . DDD (degenerative disc disease), cervical   . DDD (degenerative disc disease), lumbosacral   . Depression   . Dyspnea   . Edema   . Flu    1/18  . Gastritis   . GERD (gastroesophageal reflux disease)   . Hematuria   . Hernia of abdominal wall 07/17/2018  . Hernia, abdominal   . Hypertension   . Kidney stones   . Low back pain   . Lumbar radiculitis   . Malodorous urine   . Muscle weakness   . Obesity   . Oxygen dependent   . Renal cyst   . Sensory urge incontinence   . Thyroid activity  decreased    9/19  . Tobacco abuse   . Wheezing     Patient Active Problem List   Diagnosis Date Noted  . Chronic use of opiate for therapeutic purpose 01/26/2021  . Left thigh pain 10/28/2020  . Uncomplicated opioid dependence ([place]) 04/08/2020  . Pharmacologic therapy 04/08/2020  . Pneumonia due to COVID-19 virus 02/03/2020  . Numbness and tingling in right hand 12/31/2019  . Numbness in feet 12/31/2019  . Altered consciousness 11/26/2019  . Frequent falls 11/26/2019  . Weakness of both lower extremities 11/26/2019  . Protruded lumbar disc (L2-3) (Left) 09/10/2019  . Chronic knee pain (Left) 08/14/2019  . Chronic lumbar radiculopathy/radiculitis (L2) (Left) 08/14/2019  . Abnormal MRI, lumbar spine (07/23/2019) 08/14/2019  . Body mass index (BMI) 40.0-44.9, adult ([place]) 07/25/2019  . Acute on chronic respiratory failure with hypoxia ([place]) 07/06/2019  . Cervicalgia (Bilateral) (R>L) 09/24/2018  . Spondylosis without myelopathy or radiculopathy, lumbosacral region 09/24/2018  . Epigastric hernia 05/04/2018  . Chronic lower extremity pain (Bilateral) (L>R) 01/25/2018  . Altered mental status, unspecified 12/09/2017  . Chronic hip pain [facility]) (Bilateral) (L>R) 11/27/2017  . COPD (chronic obstructive pulmonary disease) ([place]) 08/11/2017  . Other long term (current) drug therapy 08/08/2017  . Disorder of bone, unspecified 08/08/2017  . COPD with hypoxia ([place]) 06/27/2017  . Arthralgia of acromioclavicular  joint (Right) 06/15/2017  . Acromioclavicular joint DJD (Right) 06/15/2017  . 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  . Chronic shoulder pain (Left) 05/11/2017  . Elevated brain natriuretic peptide (BNP) level 05/11/2017  . Grief at loss of child 02/14/2017  . Lumbar facet hypertrophy (multilevel) 11/17/2016  . Lumbar spinal stenosis (L4-5) 11/17/2016  . 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) 11/17/2016  . Lumbar disc herniation with foraminal protrusion  (L4-5) (Left) 11/17/2016  . Lumbosacral radiculopathy at L4 11/17/2016  . Supplemental oxygen dependent 08/01/2016  . Sleep apnea 06/02/2016  . Long term current use of opiate analgesic 01/12/2016  . Long term prescription opiate use 01/12/2016  . Opiate use (15 MME/Day) 01/12/2016  . Lumbar facet syndrome (Bilateral) (L>R) 01/12/2016  . Chronic sacroiliac joint pain (Bilateral) 01/12/2016  . Vitamin D deficiency 12/30/2015  . 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 Obesity, Class III, BMI 40-49.9 (morbid obesity) ([place]) 12/28/2015  . 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[place]) 11/19/2015  . Coagulation disorder ([place]) 10/22/2015  . Chronic pain syndrome (significant psychosocial component) 09/21/2015  . 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 09/21/2015  . Encounter for therapeutic drug level monitoring 09/16/2015  . Encounter for chronic pain management 09/16/2015  . Pain management 09/16/2015  . Neurogenic pain 09/16/2015  . Neuropathic pain 09/16/2015  . Musculoskeletal pain 09/16/2015  . Lumbar spondylosis (L4-5) 09/16/2015  . Chronic low back pain (Primary Area of Pain) (Bilateral) (L>R) 09/16/2015  . Chronic lower extremity pain (Secondary Area of Pain) (Left) 09/16/2015  . Chronic lumbar radicular pain (L4 & S1 Dermatome) (Left) 09/16/2015  . Chronic shoulder pain (Right side) 09/16/2015  . Chronic upper extremity pain (Right-sided) 09/16/2015  . Cervical radiculitis (Right side) 09/16/2015  . Abnormal x-ray of lumbar spine 09/16/2015  . Chronic diastolic heart failure ([place]) 07/15/2015  . Chest pain 07/15/2015  . Tobacco abuse 07/15/2015  . Tendinitis 04/21/2015  . Blood clotting disorder ([place]) 04/21/2015  . Decreased motor strength 07/16/2014  . Clinical depression 07/16/2014  . Chronic rhinitis 07/16/2014  . Carpal tunnel syndrome 07/16/2014  . Anxiety state 07/16/2014  . Nausea without vomiting 07/16/2014  . Edema 07/16/2014   . Generalized muscle weakness 07/16/2014  . Neuritis or radiculitis due to rupture of lumbar intervertebral disc 07/02/2014  . DDD (degenerative disc disease), lumbar 07/02/2014  . CAFL (chronic airflow limitation) ([place]) 02/27/2014  . Herpes zoster 07/16/2009  . Essential (primary) hypertension 03/27/2008    Past Surgical History:  Procedure Laterality Date  . ABDOMINAL HYSTERECTOMY    . CARPAL TUNNEL RELEASE Left 2012  . EPIGASTRIC HERNIA REPAIR N/A 08/13/2018   HERNIA REPAIR EPIGASTRIC ADULT; polypropylene mesh reinforcement.  Surgeon: Robert Bellow, MD;  Location: [facility] ORS;  Service: General;  Laterality: N/A;  . RIGHT/LEFT HEART CATH AND CORONARY ANGIOGRAPHY N/A 07/11/2019   Procedure: RIGHT/LEFT HEART CATH AND CORONARY ANGIOGRAPHY;  Surgeon: Yolonda Kida, MD;  Location: [place] [facility];  Service: Cardiovascular;  Laterality: N/A;  . TONSILLECTOMY    . TUBAL LIGATION    . UMBILICAL HERNIA REPAIR N/A 08/13/2018   HERNIA REPAIR UMBILICAL ADULT; polypropylene mesh reinforcement Surgeon: Robert Bellow, MD;  Location: [facility] ORS;  Service: General;  Laterality: N/A;    Prior to Admission medications   Medication Sig Start Date End Date Taking? Authorizing Provider  albuterol (VENTOLIN HFA) 108 (90 Base) MCG/ACT inhaler Inhale 2 puffs into the lungs every 6 (six) hours as needed for wheezing or shortness of breath. 10/23/19   Alfred Levins,  [place], MD  aspirin EC 81 MG tablet Take 81 mg by mouth daily.    [provider]  baclofen (LIORESAL) 10 MG tablet Take 1 tablet (10 mg total) by mouth 3 (three) times daily. 07/11/20 01/07/21  Milinda Pointer, MD  Cholecalciferol 50 MCG (2000 UT) CAPS Take 2,000 Units by mouth daily.     [provider]  digoxin (LANOXIN) 0.25 MG tablet TAKE 1 TABLET(0.25 MG) BY MOUTH DAILY 11/30/20   Chrismon, Vickki Muff, PA-C  Fluticasone-Umeclidin-Vilant 100-62.5-25 MCG/INH AEPB Inhale 1 puff into the lungs daily. 10/03/18   [provider]  gabapentin (NEURONTIN) 400 MG capsule Take 1 capsule (400 mg total) by mouth 3 (three) times daily. 07/11/20 01/07/21  Milinda Pointer, MD  HYDROcodone-acetaminophen (NORCO/VICODIN) 5-325 MG tablet Take 1 tablet by mouth every 8 (eight) hours as needed for severe pain. Must last 30 days 02/04/21 03/06/21  Milinda Pointer, MD  HYDROcodone-acetaminophen (NORCO/VICODIN) 5-325 MG tablet Take 1 tablet by mouth every 8 (eight) hours as needed for severe pain. Must last 30 days 03/06/21 04/05/21  Milinda Pointer, MD  HYDROcodone-acetaminophen (NORCO/VICODIN) 5-325 MG tablet Take 1 tablet by mouth every 8 (eight) hours as needed for severe pain. Must last 30 days 04/05/21 05/05/21  Milinda Pointer, MD  ipratropium-albuterol (DUONEB) 0.5-2.5 (3) MG/3ML SOLN USE 1 VIAL VIA NEBULIZER EVERY 6 HOURS AS NEEDED FOR SHORTNESS OF BREATH OR WHEEZING 11/30/20   Chrismon, Vickki Muff, PA-C  levothyroxine (SYNTHROID) 25 MCG tablet TAKE 1 TABLET(25 MCG) BY MOUTH DAILY BEFORE BREAKFAST 12/17/20   Chrismon, Vickki Muff, PA-C  OXYGEN Inhale 2.5 L/min into the lungs.    [provider]  potassium chloride SA (KLOR-CON) 20 MEQ tablet TAKE 1 TABLET(20 MEQ) BY MOUTH TWICE DAILY 11/26/20   Darylene Price A, FNP  Roflumilast (DALIRESP) 250 MCG TABS Take 250 mg by mouth daily as needed.    [provider]  sertraline (ZOLOFT) 100 MG tablet TAKE 2 TABLETS BY MOUTH EVERY MORNING 11/18/20   Chrismon, Vickki Muff, PA-C  torsemide (DEMADEX) 20 MG tablet Take 2 tablets (40 mg total) by mouth 2 (two) times daily. 05/21/20   [name], FNP  zaleplon (SONATA) 5 MG capsule Take 5 mg by mouth at bedtime as needed for sleep.    [provider]    Allergies Codeine and Meloxicam  Family History  Problem Relation Age of Onset  . Heart disease Mother   . Stroke Mother   . Coronary artery disease Mother   . Lung cancer Sister   . Cancer Sister   . Breast cancer Sister   . Alcohol abuse Father   . Heart  disease Father   . Cancer Brother   . Cancer Brother   . Pneumonia Brother   . Prostate cancer Neg Hx   . Kidney cancer Neg Hx   . Bladder Cancer Neg Hx     Social History Social History   Tobacco Use  . Smoking status: Current Every Day Smoker    Packs/day: 0.50    Years: 44.00    Pack years: 22.00    Types: Cigarettes  . Smokeless tobacco: Never Used  . Tobacco comment: over a half pack  Vaping Use  . Vaping Use: Never used  Substance Use Topics  . Alcohol use: No  . Drug use: No    Review of Systems  Constitutional: Negative for fever. Eyes: Negative for visual changes. ENT: Negative for sore throat. Neck: No neck pain  Cardiovascular: Negative for  chest pain. + orthopnea Respiratory: Negative for shortness of breath. Gastrointestinal: Negative for abdominal pain, vomiting or diarrhea. + increased abd girth Genitourinary: Negative for dysuria. Musculoskeletal: Negative for back pain. Skin: Negative for rash. Neurological: Negative for headaches, weakness or numbness. Psych: No SI or HI  ____________________________________________   PHYSICAL EXAM:  VITAL SIGNS: ED Triage Vitals  Enc Vitals Group     BP 02/04/21 0021 139/84     Pulse Rate 02/04/21 0021 78     Resp 02/04/21 0021 18     Temp 02/04/21 0021 98.4 F (36.9 C)     Temp Source 02/04/21 0021 Oral     SpO2 02/04/21 0021 95 %     Weight 02/04/21 0019 189 lb (85.7 kg)     Height 02/04/21 0019 _0  (1.575 m)     Head Circumference --      Peak Flow --      Pain Score 02/04/21 0019 0     Pain Loc --      Pain Edu? --      Excl. in [place]? --     Constitutional: Alert and oriented. Well appearing and in no apparent distress. HEENT:      Head: Normocephalic and atraumatic.         Eyes: Conjunctivae are normal. Sclera is non-icteric.       Mouth/Throat: Mucous membranes are moist.       Neck: Supple with no signs of meningismus. Cardiovascular: Regular rate and rhythm. No murmurs, gallops, or  rubs. 2+ symmetrical distal pulses are present in all extremities. Elevated JVD. Respiratory: Normal respiratory effort. Lungs are clear to auscultation bilaterally.  Gastrointestinal: Soft, non tender, and non distended. Musculoskeletal:  No edema, cyanosis, or erythema of extremities. Neurologic: Normal speech and language. Face is symmetric. Moving all extremities. No gross focal neurologic deficits are appreciated. Skin: Skin is warm, dry and intact. No rash noted. Psychiatric: Mood and affect are normal. Speech and behavior are normal.  ____________________________________________   LABS (all labs ordered are listed, but only abnormal results are displayed)  Labs Reviewed  CBC WITH DIFFERENTIAL/PLATELET - Abnormal; Notable for the following components:      Result Value   WBC 12.9 (*)    RBC 5.52 (*)    Hemoglobin 15.8 (*)    HCT 50.3 (*)    RDW 18.8 (*)    Neutro Abs 9.3 (*)    Abs Immature Granulocytes 0.09 (*)    All other components within normal limits  D-DIMER, QUANTITATIVE (NOT AT [facility]) - Abnormal; Notable for the following components:   D-Dimer, Quant 1.30 (*)    All other components within normal limits  RESP PANEL BY RT-PCR (FLU A&B, COVID) ARPGX2  BASIC METABOLIC PANEL  BRAIN NATRIURETIC PEPTIDE  PROCALCITONIN  TROPONIN I (HIGH SENSITIVITY)  TROPONIN I (HIGH SENSITIVITY)   ____________________________________________  EKG  ED ECG REPORT I, Rudene Re, the attending physician, personally viewed and interpreted this ECG.  Sinus rhythm with a rate of 81, normal intervals, normal axis, mild diffuse depressions in inferior and lateral leads with no ST elevations.  Unchanged from prior from 2021 ____________________________________________  RADIOLOGY  I have personally reviewed the images performed during this visit and I agree with the Radiologist's read.   Interpretation by Radiologist:  CT Angio Chest PE W and/or Wo Contrast  Result Date:  02/04/2021 CLINICAL DATA:  Chest pain for a few weeks.  Chest pressure. EXAM: CT ANGIOGRAPHY CHEST WITH CONTRAST TECHNIQUE: Multidetector CT imaging  of the chest was performed using the standard protocol during bolus administration of intravenous contrast. Multiplanar CT image reconstructions and MIPs were obtained to evaluate the vascular anatomy. CONTRAST:  57m OMNIPAQUE IOHEXOL 350 MG/ML SOLN COMPARISON:  06/26/2020 FINDINGS: Cardiovascular: Satisfactory opacification of the pulmonary arteries to the segmental level. No evidence of pulmonary embolism. Normal heart size. No pericardial effusion. Aortic and coronary atherosclerotic calcification. Dilated main pulmonary artery at 37 mm, usually indicating pulmonary hypertension. Mediastinum/Nodes: No adenopathy or mass Lungs/Pleura: Centrilobular emphysema. There is no edema, consolidation, effusion, or pneumothorax. Subpleural right upper lobe nodule which has been stable since at least 2019. Upper Abdomen: Atheromatous calcification and small hepatic cystic densities. Musculoskeletal: Midthoracic disc degeneration. Review of the MIP images confirms the above findings. IMPRESSION: 1. Negative for pulmonary embolism or other acute finding. 2. Aortic Atherosclerosis (ICD10-I70.0) and Emphysema (ICD10-J43.9). Coronary atherosclerosis. Electronically Signed   By: JMonte FantasiaM.D.   On: 02/04/2021 04:46   DG Chest Portable 1 View  Result Date: 02/04/2021 CLINICAL DATA:  Shortness of breath EXAM: PORTABLE CHEST 1 VIEW COMPARISON:  06/26/2020 FINDINGS: Cardiac shadow is within normal limits. Aortic calcifications are noted. Mild vascular congestion is noted with very mild interstitial edema. No sizable effusion is noted. No confluent infiltrate is seen. No bony abnormality is noted. IMPRESSION: Mild vascular congestion and interstitial edema. Electronically Signed   By: MInez CatalinaM.D.   On: 02/04/2021 00:56      ____________________________________________   PROCEDURES  Procedure(s) performed: yes .1-3 Lead EKG Interpretation Performed by: VRudene Re MD Authorized by: VRudene Re MD     Interpretation: non-specific     ECG rate assessment: normal     Rhythm: sinus rhythm     Ectopy: none     Conduction: normal     Critical Care performed:  Yes  CRITICAL CARE Performed by: CRudene Re ?  Total critical care time: 30 min  Critical care time was exclusive of separately billable procedures and treating other patients.  Critical care was necessary to treat or prevent imminent or life-threatening deterioration.  Critical care was time spent personally by me on the following activities: development of treatment plan with patient and/or surrogate as well as nursing, discussions with consultants, evaluation of patient's response to treatment, examination of patient, obtaining history from patient or surrogate, ordering and performing treatments and interventions, ordering and review of laboratory studies, ordering and review of radiographic studies, pulse oximetry and re-evaluation of patient's condition.  ____________________________________________   INITIAL IMPRESSION / ASSESSMENT AND PLAN / ED COURSE  65y.o. female with a history of diastolic CHF, OSA on BiPAP, COPD, chronic respiratory failure on 2.5 L who presents for evaluation of orthopnea associated with chest tightness, bilateral hand swelling and increased abdominal girth which has been going on for 3 days.  Patient looks volume overloaded with elevated JVD but satting well on her 2.5 L of oxygen.  She has no lower extremity pitting edema.     Ddx CHF exacerbation versus COPD exacerbation versus pneumonia versus PE versus viral syndrome.  Since symptoms seem to be present when she lays down only at this point I believe this is less likely pulmonary embolism.  Her chest x-ray was visualized by me showing  mild pulmonary edema, confirmed by radiology although BNP is WNL and weight is stable when compared to last CHF clinic visit. Will give a dose of IV lasix.   Patient has mild leukocytosis with a left shift but denies any cough or  fever.  We will add a procalcitonin.  No significant electrolyte derangements otherwise.  Troponin is negative.  D-dimer is pending.  Patient placed on telemetry for close monitoring.  Old medical records review including last echocardiogram showing normal EF on 08/2020.  _________________________ 4:54 AM on 02/04/2021 -----------------------------------------  Patient diuresed extremely well after 80 mg of IV Lasix.  He feels markedly improved.  Sats at 99 to 100% on her baseline oxygen.  COVID and flu negative.  2 high-sensitivity troponins are negative.  D-dimer was elevated therefore she was sent for CT angio of the chest which was negative for PE or any other acute findings.  Mildly elevated white count with a left shift but no fever, no cough, negative procalcitonin therefore no antibiotics were initiated.  Will refer back to heart failure clinic for close follow-up.  Discussed my standard return precautions.     _____________________________________________ Please note:  Patient was evaluated in Emergency Department today for the symptoms described in the history of present illness. Patient was evaluated in the context of the global COVID-19 pandemic, which necessitated consideration that the patient might be at risk for infection with the SARS-CoV-2 virus that causes COVID-19. Institutional protocols and algorithms that pertain to the evaluation of patients at risk for COVID-19 are in a state of rapid change based on information released by regulatory bodies including the CDC and federal and state organizations. These policies and algorithms were followed during the patient's care in the ED.  Some ED evaluations and interventions may be delayed as a result of limited  staffing during the pandemic.   [place] Controlled Substance Database was reviewed by me. ____________________________________________   FINAL CLINICAL IMPRESSION(S) / ED DIAGNOSES   Final diagnoses:  Acute on chronic congestive heart failure, unspecified heart failure type ([place])      NEW MEDICATIONS STARTED DURING THIS VISIT:  ED Discharge Orders         Ordered    AMB referral to CHF clinic        02/04/21 0453           Note:  This document was prepared using Dragon voice recognition software and may include unintentional dictation errors.    Alfred Levins, [place], MD 02/04/21 [phone]

## 2021-02-04 NOTE — ED Triage Notes (Addendum)
Pt states she has noticed swellign in her hands and stomach for the past 3 days as well as sob when she lays down. Pt takes lasix, states at home she inc her dose to try to get rid ofsome of the swelling. Pt denies chest pain. Pt on 2.5L o2 at baseline

## 2021-02-04 NOTE — Discharge Instructions (Addendum)
Continue your torsemide as prescribed.  Follow-up with the heart failure clinic.  Return to the emergency room for new or worsening chest pain or shortness of breath.

## 2021-02-04 NOTE — Progress Notes (Addendum)
Patient ID: Theresa [name], female    DOB: [birth year], 65 y.o.   MRN: [id number]  HPI Theresa [name] is a 65 y/o female with a history of chronic low back pain, HTN, depression, COPD on long-term oxygen, anxiety, carpal tunnel syndrome, long-standing tobacco use and chronic heart failure.  Echo report from 09/03/20 reviewed and showed an EF of >55% with mild LAE and severe TR. Echo report from 07/07/2019 reviewed and showed an EF of 60-65% along with mild TR and moderately elevated PA pressure. Echo done 11/03/15 but unable to access results. Echo done on 02/19/15 showed an EF of 50% without valvular stenosis.   Catheterization done 07/11/2019 showed:   Prox RCA to Dist RCA lesion is 50% stenosed.  Mid LAD lesion is 50% stenosed.  Ost LM to Dist LM lesion is 10% stenosed.  Hemodynamic findings consistent with moderate pulmonary hypertension.  Normal overall left ventricular function of at least 60%  Moderate coronary disease no significant obstructive disease  Moderate pulmonary hypertension  Was in the ED 02/04/21 due to acute on chronic HF. IV lasix given with good diuresis. D-dimer was elevated therefore she was sent for CT angio of the chest which was negative for PE or any other acute findings.    She presents today for a follow-up visit with a chief complaint of moderate fatigue upon minimal exertion. She describes this as chronic in nature having been present for several years. She has associated cough, shortness of breath, rare chest pain, chronic back pain, anxiety and difficulty sleeping along with this. She denies any dizziness, abdominal distention, palpitations, pedal edema or wheezing.   Noted a gradual weight gain at home and attempted to take extra torsemide but without any relief. Ended up going from her normal 182 pounds up to 191 pounds when she went to the ED. Is now back down and reports "feeling much better" after getting IV lasix in the ED.   Past Medical History:   Diagnosis Date  . Acute postoperative pain 10/03/2017  . Anxiety   . BiPAP (biphasic positive airway pressure) dependence    at hs  . Carpal tunnel syndrome   . CHF (congestive heart failure) ([place])    1/18  . CHF (congestive heart failure) ([place])   . Chronic generalized abdominal pain   . Chronic rhinitis   . COPD (chronic obstructive pulmonary disease) ([place])   . DDD (degenerative disc disease), cervical   . DDD (degenerative disc disease), lumbosacral   . Depression   . Dyspnea   . Edema   . Flu    1/18  . Gastritis   . GERD (gastroesophageal reflux disease)   . Hematuria   . Hernia of abdominal wall 07/17/2018  . Hernia, abdominal   . Hypertension   . Kidney stones   . Low back pain   . Lumbar radiculitis   . Malodorous urine   . Muscle weakness   . Obesity   . Oxygen dependent   . Renal cyst   . Sensory urge incontinence   . Thyroid activity decreased    9/19  . Tobacco abuse   . Wheezing    Past Surgical History:  Procedure Laterality Date  . ABDOMINAL HYSTERECTOMY    . CARPAL TUNNEL RELEASE Left 2012  . EPIGASTRIC HERNIA REPAIR N/A 08/13/2018   HERNIA REPAIR EPIGASTRIC ADULT; polypropylene mesh reinforcement.  Surgeon: Robert Bellow, MD;  Location: [facility];  Service: General;  Laterality: N/A;  . RIGHT/LEFT HEART CATH AND CORONARY  ANGIOGRAPHY N/A 07/11/2019   Procedure: RIGHT/LEFT HEART CATH AND CORONARY ANGIOGRAPHY;  Surgeon: Yolonda Kida, MD;  Location: [facility];  Service: Cardiovascular;  Laterality: N/A;  . TONSILLECTOMY    . TUBAL LIGATION    . UMBILICAL HERNIA REPAIR N/A 08/13/2018   HERNIA REPAIR UMBILICAL ADULT; polypropylene mesh reinforcement Surgeon: Robert Bellow, MD;  Location: [facility];  Service: General;  Laterality: N/A;   Family History  Problem Relation Age of Onset  . Heart disease Mother   . Stroke Mother   . Coronary artery disease Mother   . Lung cancer Sister   . Cancer Sister   . Breast cancer Sister    . Alcohol abuse Father   . Heart disease Father   . Cancer Brother   . Cancer Brother   . Pneumonia Brother   . Prostate cancer Neg Hx   . Kidney cancer Neg Hx   . Bladder Cancer Neg Hx    Social History   Tobacco Use  . Smoking status: Current Every Day Smoker    Packs/day: 0.50    Years: 44.00    Pack years: 22.00    Types: Cigarettes  . Smokeless tobacco: Never Used  . Tobacco comment: over a half pack  Substance Use Topics  . Alcohol use: No   Allergies  Allergen Reactions  . Codeine Nausea And Vomiting  . Meloxicam Other (See Comments)    Stomach pain   Prior to Admission medications   Medication Sig Start Date End Date Taking? Authorizing Provider  albuterol (VENTOLIN HFA) 108 (90 Base) MCG/ACT inhaler Inhale 2 puffs into the lungs every 6 (six) hours as needed for wheezing or shortness of breath. 10/23/19  Yes Alfred Levins, [place], MD  aspirin EC 81 MG tablet Take 81 mg by mouth daily.   Yes [provider]  baclofen (LIORESAL) 10 MG tablet Take 1 tablet (10 mg total) by mouth 3 (three) times daily. 07/11/20 01/07/21 Yes Milinda Pointer, MD  Cholecalciferol 50 MCG (2000 UT) CAPS Take 2,000 Units by mouth daily.    Yes [provider]  digoxin (LANOXIN) 0.25 MG tablet TAKE 1 TABLET(0.25 MG) BY MOUTH DAILY 11/30/20  Yes Chrismon, Vickki Muff, PA-C  Fluticasone-Umeclidin-Vilant 100-62.5-25 MCG/INH AEPB Inhale 1 puff into the lungs daily. 10/03/18  Yes [provider]  gabapentin (NEURONTIN) 400 MG capsule Take 1 capsule (400 mg total) by mouth 3 (three) times daily. 07/11/20 01/07/21 Yes Milinda Pointer, MD  HYDROcodone-acetaminophen (NORCO/VICODIN) 5-325 MG tablet Take 1 tablet by mouth every 8 (eight) hours as needed for severe pain. Must last 30 days 02/04/21 03/06/21 Yes Milinda Pointer, MD  HYDROcodone-acetaminophen (NORCO/VICODIN) 5-325 MG tablet Take 1 tablet by mouth every 8 (eight) hours as needed for severe pain. Must last 30 days 03/06/21  04/05/21 Yes Milinda Pointer, MD  HYDROcodone-acetaminophen (NORCO/VICODIN) 5-325 MG tablet Take 1 tablet by mouth every 8 (eight) hours as needed for severe pain. Must last 30 days 04/05/21 05/05/21 Yes Milinda Pointer, MD  ipratropium-albuterol (DUONEB) 0.5-2.5 (3) MG/3ML SOLN USE 1 VIAL VIA NEBULIZER EVERY 6 HOURS AS NEEDED FOR SHORTNESS OF BREATH OR WHEEZING 11/30/20  Yes Chrismon, Vickki Muff, PA-C  levothyroxine (SYNTHROID) 25 MCG tablet TAKE 1 TABLET(25 MCG) BY MOUTH DAILY BEFORE BREAKFAST 12/17/20  Yes Chrismon, Vickki Muff, PA-C  OXYGEN Inhale 2.5 L/min into the lungs.   Yes [provider]  potassium chloride SA (KLOR-CON) 20 MEQ tablet TAKE 1 TABLET(20 MEQ) BY MOUTH TWICE DAILY 11/26/20  Yes Shiloh,  Aura Fey, FNP  Roflumilast (DALIRESP) 250 MCG TABS Take 500 mcg by mouth daily as needed.   Yes [provider]  sertraline (ZOLOFT) 100 MG tablet TAKE 2 TABLETS BY MOUTH EVERY MORNING 11/18/20  Yes Chrismon, Vickki Muff, PA-C  torsemide (DEMADEX) 20 MG tablet Take 2 tablets (40 mg total) by mouth 2 (two) times daily. 05/21/20  Yes Darylene Price A, FNP  zaleplon (SONATA) 5 MG capsule Take 5 mg by mouth at bedtime as needed for sleep.   Yes [provider]   Review of Systems  Constitutional: Positive for fatigue. Negative for appetite change.  HENT: Negative for congestion, postnasal drip and sore throat.   Eyes: Negative.   Respiratory: Positive for cough and shortness of breath. Negative for chest tightness and wheezing.   Cardiovascular: Positive for chest pain (intermittent at times). Negative for palpitations and leg swelling.  Gastrointestinal: Negative for abdominal distention, abdominal pain and nausea.  Endocrine: Negative.   Genitourinary: Negative.   Musculoskeletal: Positive for back pain. Negative for neck pain.  Skin: Negative.   Allergic/Immunologic: Negative.   Neurological: Negative for dizziness and light-headedness.  Hematological: Negative for adenopathy.  Does not bruise/bleed easily.  Psychiatric/Behavioral: Positive for sleep disturbance (wearing oxygen & bipap at night). Negative for agitation, dysphoric mood and suicidal ideas. The patient is nervous/anxious.    Vitals:   02/05/21 0938  BP: 119/79  Pulse: 96  Resp: 20  SpO2: 92%  Weight: 181 lb 6 oz (82.3 kg)  Height: _0  (1.575 m)   Wt Readings from Last 3 Encounters:  02/05/21 181 lb 6 oz (82.3 kg)  02/04/21 188 lb (85.3 kg)  01/27/21 182 lb (82.6 kg)   Lab Results  Component Value Date   CREATININE 0.80 02/04/2021   CREATININE 0.84 06/26/2020   CREATININE 0.83 06/25/2020    Physical Exam Vitals and nursing note reviewed.  Constitutional:      Appearance: She is well-developed.  HENT:     Head: Normocephalic and atraumatic.  Neck:     Vascular: No JVD.  Cardiovascular:     Rate and Rhythm: Normal rate and regular rhythm.  Pulmonary:     Effort: Pulmonary effort is normal.     Breath sounds: No wheezing, rhonchi or rales.  Abdominal:     General: There is no distension.     Palpations: Abdomen is soft.     Tenderness: There is no abdominal tenderness.  Musculoskeletal:        General: No tenderness.     Cervical back: Normal range of motion and neck supple.  Skin:    General: Skin is warm and dry.  Neurological:     Mental Status: She is alert and oriented to person, place, and time.  Psychiatric:        Mood and Affect: Mood normal. Mood is not depressed.        Behavior: Behavior normal.        Thought Content: Thought content normal.      Assessment & Plan:  1: Chronic heart failure with preserved ejection fraction without structural changes- - NYHA class III - euvolemic today - continues to weigh daily at home. Reminded to call for an overnight weight gain of >2 pounds or a weekly weight gain of >5 pounds - weight down 12 pounds from last visit here 2 months ago - has continued to lose weight due to following a keto diet and reading food labels   - reviewed the importance of  contacting us if she starts to gain weight as we could give metolazone or IV lasix outpatient to try and help keep her out of the ED - had telemedicine visit with cardiologist Clayborn Bigness) 11/30/20  - has received 1 dose of [employer] covid vaccine - BNP 02/04/21 was 59.7 - PharmD reconciled medications with the patient  2: HTN- - BP looks good today - had video visit with PCP (Chrismon) 09/18/20 - BMP from 02/04/21 reviewed and showed sodium 136, potassium 3.8, creatinine 0.8 and GFR >60  3: COPD- - wearing bipap nightly - oxygen at 3L mostly around the clock right now - last saw pulmonology Raul Del) 12/01/20  4: Tobacco use- - alternates between the lozenges, gum and actual cigarettes - removes herself from the oxygen when smoking - complete cessation discussed for 3 minutes with her   Patient did not bring her medications nor a list. Each medication was verbally reviewed with the patient and she was encouraged to bring the bottles to every visit to confirm accuracy of list.  Return in 4 months or sooner for any questions/problems before then.

## 2021-02-05 ENCOUNTER — Ambulatory Visit: Payer: Medicare HMO | Attending: Family | Admitting: Family

## 2021-02-05 ENCOUNTER — Encounter: Payer: Self-pay | Admitting: Family

## 2021-02-05 VITALS — BP 119/79 | HR 96 | Resp 20 | Ht 62.0 in | Wt 181.4 lb

## 2021-02-05 DIAGNOSIS — I272 Pulmonary hypertension, unspecified: Secondary | ICD-10-CM | POA: Diagnosis not present

## 2021-02-05 DIAGNOSIS — F17210 Nicotine dependence, cigarettes, uncomplicated: Secondary | ICD-10-CM | POA: Insufficient documentation

## 2021-02-05 DIAGNOSIS — Z9981 Dependence on supplemental oxygen: Secondary | ICD-10-CM | POA: Diagnosis not present

## 2021-02-05 DIAGNOSIS — J449 Chronic obstructive pulmonary disease, unspecified: Secondary | ICD-10-CM | POA: Diagnosis not present

## 2021-02-05 DIAGNOSIS — Z7982 Long term (current) use of aspirin: Secondary | ICD-10-CM | POA: Insufficient documentation

## 2021-02-05 DIAGNOSIS — Z8249 Family history of ischemic heart disease and other diseases of the circulatory system: Secondary | ICD-10-CM | POA: Insufficient documentation

## 2021-02-05 DIAGNOSIS — I509 Heart failure, unspecified: Secondary | ICD-10-CM | POA: Diagnosis not present

## 2021-02-05 DIAGNOSIS — G8929 Other chronic pain: Secondary | ICD-10-CM | POA: Insufficient documentation

## 2021-02-05 DIAGNOSIS — Z7951 Long term (current) use of inhaled steroids: Secondary | ICD-10-CM | POA: Insufficient documentation

## 2021-02-05 DIAGNOSIS — Z28311 Partially vaccinated for covid-19: Secondary | ICD-10-CM | POA: Diagnosis not present

## 2021-02-05 DIAGNOSIS — Z79899 Other long term (current) drug therapy: Secondary | ICD-10-CM | POA: Insufficient documentation

## 2021-02-05 DIAGNOSIS — I5032 Chronic diastolic (congestive) heart failure: Secondary | ICD-10-CM

## 2021-02-05 DIAGNOSIS — I110 Hypertensive heart disease with heart failure: Secondary | ICD-10-CM | POA: Insufficient documentation

## 2021-02-05 DIAGNOSIS — I10 Essential (primary) hypertension: Secondary | ICD-10-CM

## 2021-02-05 DIAGNOSIS — Z720 Tobacco use: Secondary | ICD-10-CM

## 2021-02-05 NOTE — Progress Notes (Addendum)
[facility] - PHARMACIST COUNSELING NOTE  ADHERENCE ASSESSMENT  Adherence strategy: husband helps with medications   Do you ever forget to take your medication? _0 Yes (1) _1 No (0)  Do you ever skip doses due to side effects? _2 Yes (1) _3 No (0)  Do you have trouble affording your medicines? _4 Yes (1) _5 No (0)  Are you ever unable to pick up your medication due to transportation difficulties? _6 Yes (1) _7 No (0)  Do you ever stop taking your medications because you don't believe they are helping? _8 Yes (1) _9 No (0)  Total score _0______    Recommendations given to patient about increasing adherence: N/A  Guideline-Directed Medical Therapy/Evidence Based Medicine  ACE/ARB/ARNI: N/A Beta Blocker: N/A Aldosterone Antagonist: N/A Diuretic: Torsemide 40 mg twice daily     SUBJECTIVE  HPI:  Past Medical History:  Diagnosis Date  . Acute postoperative pain 10/03/2017  . Anxiety   . BiPAP (biphasic positive airway pressure) dependence    at hs  . Carpal tunnel syndrome   . CHF (congestive heart failure) ([place])    1/18  . CHF (congestive heart failure) ([place])   . Chronic generalized abdominal pain   . Chronic rhinitis   . COPD (chronic obstructive pulmonary disease) ([place])   . DDD (degenerative disc disease), cervical   . DDD (degenerative disc disease), lumbosacral   . Depression   . Dyspnea   . Edema   . Flu    1/18  . Gastritis   . GERD (gastroesophageal reflux disease)   . Hematuria   . Hernia of abdominal wall 07/17/2018  . Hernia, abdominal   . Hypertension   . Kidney stones   . Low back pain   . Lumbar radiculitis   . Malodorous urine   . Muscle weakness   . Obesity   . Oxygen dependent   . Renal cyst   . Sensory urge incontinence   . Thyroid activity decreased    9/19  . Tobacco abuse   . Wheezing         OBJECTIVE   Vital signs: HR 96, BP 119/79, weight 82.3 kg ECHO: Date 07/07/19, EF 60-65% Cath:  Date 07/11/19, EF 60%  BMP Latest Ref Rng & Units 02/04/2021 06/26/2020 06/25/2020  Glucose 70 - 99 mg/dL 98 104(H) 85  BUN 8 - 23 mg/dL _10 Creatinine 0.44 - 1.00 mg/dL 0.80 0.84 0.83  BUN/Creat Ratio 12 - 28 - - -  Sodium 135 - 145 mmol/L 136 140 138  Potassium 3.5 - 5.1 mmol/L 3.8 4.0 4.1  Chloride 98 - 111 mmol/L 100 101 99  CO2 22 - 32 mmol/L _11 Calcium 8.9 - 10.3 mg/dL 9.1 9.2 9.6    ASSESSMENT 65 yo F presented to heart failure clinic for follow-up visit. PMH includes HTN, GERD, depression, COPD, and CHF. No barriers to adherence were identified during medication reconciliation. Pts husband helps manage medications. Pt visited ED on 4/21 for volume overload. Weight gain of 7-8 lbs (182>190 lbs). Pt received IV diuretics during ED visit.   PLAN CHF/HTN - Continue torsemide 40 mg twice daily and KCl 20 mEq twice daily  - Continue digoxin 0.25 mg daily  - Consider addition of SGLT-2 inhibitor or Entresto  COPD - Continue oxygen 2.5L/min  - Continue Duoneb every 6 hours as needed - Continue Trelegy 1 puff daily - Continue albuterol 2 puffs every 6 hours as needed  - Continue roflumilast 500 mcg daily  Depression  - Continue sertraline 200 mg daily   Hypothyroidism - Continue levothyroxine 25 mcg daily   Sleep - Continue zaleplon 5 mg at bedtime as needed  Pain - Continue hydrocodone-acetaminophen 5-325 mg every 8 hours as needed - Continue gabapentin 400 mg three times daily as needed - Continue baclofen 10 mg three times daily   General Health - Continue cholecalciferol 2000 units daily  - Continue aspirin 81 mg daily   Time spent: 10 minutes  Benn Moulder, PharmD Pharmacy Resident  02/05/2021 9:59 AM    Current Outpatient Medications:  .  albuterol (VENTOLIN HFA) 108 (90 Base) MCG/ACT inhaler, Inhale 2 puffs into the lungs every 6 (six) hours as needed for wheezing or shortness of breath., Disp: 8 g, Rfl: 1 .  aspirin EC 81 MG tablet,  Take 81 mg by mouth daily., Disp: , Rfl:  .  baclofen (LIORESAL) 10 MG tablet, Take 1 tablet (10 mg total) by mouth 3 (three) times daily., Disp: 90 tablet, Rfl: 5 .  Cholecalciferol 50 MCG (2000 UT) CAPS, Take 2,000 Units by mouth daily. , Disp: , Rfl:  .  digoxin (LANOXIN) 0.25 MG tablet, TAKE 1 TABLET(0.25 MG) BY MOUTH DAILY, Disp: 90 tablet, Rfl: 0 .  Fluticasone-Umeclidin-Vilant 100-62.5-25 MCG/INH AEPB, Inhale 1 puff into the lungs daily., Disp: , Rfl:  .  gabapentin (NEURONTIN) 400 MG capsule, Take 1 capsule (400 mg total) by mouth 3 (three) times daily., Disp: 90 capsule, Rfl: 5 .  HYDROcodone-acetaminophen (NORCO/VICODIN) 5-325 MG tablet, Take 1 tablet by mouth every 8 (eight) hours as needed for severe pain. Must last 30 days, Disp: 90 tablet, Rfl: 0 .  [START ON 03/06/2021] HYDROcodone-acetaminophen (NORCO/VICODIN) 5-325 MG tablet, Take 1 tablet by mouth every 8 (eight) hours as needed for severe pain. Must last 30 days, Disp: 90 tablet, Rfl: 0 .  [START ON 04/05/2021] HYDROcodone-acetaminophen (NORCO/VICODIN) 5-325 MG tablet, Take 1 tablet by mouth every 8 (eight) hours as needed for severe pain. Must last 30 days, Disp: 90 tablet, Rfl: 0 .  ipratropium-albuterol (DUONEB) 0.5-2.5 (3) MG/3ML SOLN, USE 1 VIAL VIA NEBULIZER EVERY 6 HOURS AS NEEDED FOR SHORTNESS OF BREATH OR WHEEZING, Disp: 1080 mL, Rfl: 0 .  levothyroxine (SYNTHROID) 25 MCG tablet, TAKE 1 TABLET(25 MCG) BY MOUTH DAILY BEFORE BREAKFAST, Disp: 90 tablet, Rfl: 1 .  OXYGEN, Inhale 2.5 L/min into the lungs., Disp: , Rfl:  .  potassium chloride SA (KLOR-CON) 20 MEQ tablet, TAKE 1 TABLET(20 MEQ) BY MOUTH TWICE DAILY, Disp: 180 tablet, Rfl: 3 .  Roflumilast (DALIRESP) 250 MCG TABS, Take 500 mcg by mouth daily as needed., Disp: , Rfl:  .  sertraline (ZOLOFT) 100 MG tablet, TAKE 2 TABLETS BY MOUTH EVERY MORNING, Disp: 180 tablet, Rfl: 1 .  torsemide (DEMADEX) 20 MG tablet, Take 2 tablets (40 mg total) by mouth 2 (two) times daily., Disp:  360 tablet, Rfl: 3 .  zaleplon (SONATA) 5 MG capsule, Take 5 mg by mouth at bedtime as needed for sleep., Disp: , Rfl:    COUNSELING POINTS/CLINICAL PEARLS Torsemide  Side effects may include excessive urination.  Tell patient to report symptoms of ototoxicity.  Instruct patient to report lightheadedness or syncope.  Warn patient to avoid use of nonprescription NSAID products without first discussing it with their healthcare provider.  DRUGS TO AVOID IN HEART FAILURE  Drug or Class Mechanism  Analgesics . NSAIDs . COX-2 inhibitors . Glucocorticoids  Sodium and water retention, increased systemic vascular resistance, decreased response to  diuretics   Diabetes Medications . Metformin . Thiazolidinediones o Rosiglitazone (Avandia) o Pioglitazone (Actos) . DPP4 Inhibitors o Saxagliptin (Onglyza) o Sitagliptin (Januvia)   Lactic acidosis Possible calcium channel blockade   Unknown  Antiarrhythmics . Class I  o Flecainide o Disopyramide . Class III o Sotalol . Other o Dronedarone  Negative inotrope, proarrhythmic   Proarrhythmic, beta blockade  Negative inotrope  Antihypertensives . Alpha Blockers o Doxazosin . Calcium Channel Blockers o Diltiazem o Verapamil o Nifedipine . Central Alpha Adrenergics o Moxonidine . Peripheral Vasodilators o Minoxidil  Increases renin and aldosterone  Negative inotrope    Possible sympathetic withdrawal  Unknown  Anti-infective . Itraconazole . Amphotericin B  Negative inotrope Unknown  Hematologic . Anagrelide . Cilostazol   Possible inhibition of PD IV Inhibition of PD III causing arrhythmias  Neurologic/Psychiatric . Stimulants . Anti-Seizure Drugs o Carbamazepine o Pregabalin . Antidepressants o Tricyclics o Citalopram . Parkinsons o Bromocriptine o Pergolide o Pramipexole . Antipsychotics o Clozapine . Antimigraine o Ergotamine o Methysergide . Appetite suppressants . Bipolar o Lithium   Peripheral alpha and beta agonist activity  Negative inotrope and chronotrope Calcium channel blockade  Negative inotrope, proarrhythmic Dose-dependent QT prolongation  Excessive serotonin activity/valvular damage Excessive serotonin activity/valvular damage Unknown  IgE mediated hypersensitivy, calcium channel blockade  Excessive serotonin activity/valvular damage Excessive serotonin activity/valvular damage Valvular damage  Direct myofibrillar degeneration, adrenergic stimulation  Antimalarials . Chloroquine . Hydroxychloroquine Intracellular inhibition of lysosomal enzymes  Urologic Agents . Alpha Blockers o Doxazosin o Prazosin o Tamsulosin o Terazosin  Increased renin and aldosterone  Adapted from Page RL, et al. "Drugs That May Cause or Exacerbate Heart Failure: A Scientific Statement from the [place]." Circulation 2016; 520:T61-N15. DOI: 10.1161/CIR.0000000000000426   MEDICATION ADHERENCES TIPS AND STRATEGIES 1. Taking medication as prescribed improves patient outcomes in heart failure (reduces hospitalizations, improves symptoms, increases survival) 2. Side effects of medications can be managed by decreasing doses, switching agents, stopping drugs, or adding additional therapy. Please let someone in the [facility] know if you have having bothersome side effects so we can modify your regimen. Do not alter your medication regimen without talking to [place].  3. Medication reminders can help patients remember to take drugs on time. If you are missing or forgetting doses you can try linking behaviors, using pill boxes, or an electronic reminder like an alarm on your phone or an app. Some people can also get automated phone calls as medication reminders.

## 2021-02-05 NOTE — Addendum Note (Signed)
Addended by: Darylene Price A on: 02/05/2021 11:39 AM   Modules accepted: Level of Service

## 2021-02-05 NOTE — Patient Instructions (Signed)
Continue weighing daily and call for an overnight weight gain of > 2 pounds or a weekly weight gain of >5 pounds. 

## 2021-02-06 DIAGNOSIS — J449 Chronic obstructive pulmonary disease, unspecified: Secondary | ICD-10-CM | POA: Diagnosis not present

## 2021-02-11 ENCOUNTER — Encounter: Payer: Self-pay | Admitting: Pain Medicine

## 2021-02-11 ENCOUNTER — Ambulatory Visit (HOSPITAL_BASED_OUTPATIENT_CLINIC_OR_DEPARTMENT_OTHER): Payer: Medicare HMO | Admitting: Pain Medicine

## 2021-02-11 ENCOUNTER — Ambulatory Visit
Admission: RE | Admit: 2021-02-11 | Discharge: 2021-02-11 | Disposition: A | Discharge status: Home / Self Care | Payer: Medicare HMO | Source: Ambulatory Visit | Attending: Pain Medicine | Admitting: Pain Medicine

## 2021-02-11 ENCOUNTER — Other Ambulatory Visit: Payer: Self-pay

## 2021-02-11 VITALS — BP 105/68 | HR 87 | Temp 98.0°F | Resp 20 | Ht 62.0 in | Wt 181.0 lb

## 2021-02-11 DIAGNOSIS — G8929 Other chronic pain: Secondary | ICD-10-CM | POA: Diagnosis not present

## 2021-02-11 DIAGNOSIS — M47817 Spondylosis without myelopathy or radiculopathy, lumbosacral region: Secondary | ICD-10-CM | POA: Insufficient documentation

## 2021-02-11 DIAGNOSIS — M545 Low back pain, unspecified: Secondary | ICD-10-CM | POA: Diagnosis not present

## 2021-02-11 DIAGNOSIS — M5136 Other intervertebral disc degeneration, lumbar region: Secondary | ICD-10-CM

## 2021-02-11 DIAGNOSIS — M47816 Spondylosis without myelopathy or radiculopathy, lumbar region: Secondary | ICD-10-CM | POA: Insufficient documentation

## 2021-02-11 MED ORDER — FENTANYL CITRATE (PF) 100 MCG/2ML IJ SOLN
25.0000 ug | INTRAMUSCULAR | Status: DC | PRN
Start: 2021-02-11 — End: 2021-02-11
  Administered 2021-02-11: 50 ug via INTRAVENOUS

## 2021-02-11 MED ORDER — TRIAMCINOLONE ACETONIDE 40 MG/ML IJ SUSP
INTRAMUSCULAR | Status: AC
  Filled 2021-02-11: qty 2

## 2021-02-11 MED ORDER — FENTANYL CITRATE (PF) 100 MCG/2ML IJ SOLN
INTRAMUSCULAR | Status: AC
  Filled 2021-02-11: qty 2

## 2021-02-11 MED ORDER — TRIAMCINOLONE ACETONIDE 40 MG/ML IJ SUSP
80.0000 mg | Freq: Once | INTRAMUSCULAR | Status: AC
  Administered 2021-02-11: 80 mg

## 2021-02-11 MED ORDER — LIDOCAINE HCL 2 % IJ SOLN
20.0000 mL | Freq: Once | INTRAMUSCULAR | Status: AC
Start: 2021-02-11 — End: 2021-02-11
  Administered 2021-02-11: 400 mg

## 2021-02-11 MED ORDER — MIDAZOLAM HCL 5 MG/5ML IJ SOLN
INTRAMUSCULAR | Status: AC
  Filled 2021-02-11: qty 5

## 2021-02-11 MED ORDER — ROPIVACAINE HCL 2 MG/ML IJ SOLN
INTRAMUSCULAR | Status: AC
  Filled 2021-02-11: qty 20

## 2021-02-11 MED ORDER — LIDOCAINE HCL 2 % IJ SOLN
INTRAMUSCULAR | Status: AC
  Filled 2021-02-11: qty 20

## 2021-02-11 MED ORDER — ROPIVACAINE HCL 2 MG/ML IJ SOLN
18.0000 mL | Freq: Once | INTRAMUSCULAR | Status: AC
  Administered 2021-02-11: 18 mL via PERINEURAL

## 2021-02-11 MED ORDER — LACTATED RINGERS IV SOLN
1000.0000 mL | Freq: Once | INTRAVENOUS | Status: AC
  Administered 2021-02-11: 1000 mL via INTRAVENOUS

## 2021-02-11 MED ORDER — MIDAZOLAM HCL 5 MG/5ML IJ SOLN
1.0000 mg | INTRAMUSCULAR | Status: DC | PRN
  Administered 2021-02-11: 2 mg via INTRAVENOUS

## 2021-02-11 NOTE — Progress Notes (Signed)
PROVIDER NOTE: Information contained herein reflects review and annotations entered in association with encounter. Interpretation of such information and data should be left to medically-trained personnel. Information provided to patient can be located elsewhere in the medical record under "Patient Instructions". Document created using STT-dictation technology, any transcriptional errors that may result from process are unintentional.    Patient: Theresa [name]  Service Category: Procedure  Provider: Gaspar Cola, MD  DOB: [birth year]  DOS: 02/11/2021  Location: [facility]  MRN: [id number]  Setting: Ambulatory - outpatient  Referring Provider: Margo Common, PA-C  Type: Established Patient  Specialty: Interventional Pain Management  PCP: Margo Common, PA-C   Primary Reason for Visit: Interventional Pain Management Treatment. CC: Back Pain (Bilateral lumbar left is worse)  Procedure:          Anesthesia, Analgesia, Anxiolysis:  Type: Lumbar Facet, Medial Branch Block(s) #4  Primary Purpose: Diagnostic Region: Posterolateral Lumbosacral Spine Level: L2, L3, L4, L5, & S1 Medial Branch Level(s). Injecting these levels blocks the L3-4, L4-5, and L5-S1 lumbar facet joints. Laterality: Bilateral  Type: Moderate (Conscious) Sedation combined with Local Anesthesia Indication(s): Analgesia and Anxiety Route: Intravenous (IV) IV Access: Secured Sedation: Meaningful verbal contact was maintained at all times during the procedure  Local Anesthetic: Lidocaine 1-2%  Position: Prone   Indications: 1. Lumbar facet syndrome (Bilateral) (L>R)   2. Lumbar facet hypertrophy (multilevel)   3. Spondylosis without myelopathy or radiculopathy, lumbosacral region   4. DDD (degenerative disc disease), lumbar   5. Chronic low back pain (1ry area of Pain) (Bilateral) (L>R) w/o sciatica    Pain Score: Pre-procedure: 7 /10 Post-procedure: 2 /10   Pre-op H&P Assessment:  Ms.  [name] is a 65 y.o. (year old), female patient, seen today for interventional treatment. She  has a past surgical history that includes Abdominal hysterectomy; Tonsillectomy; Carpal tunnel release (Left, 2012); Tubal ligation; epigastric hernia repair (N/A, 08/13/2018); Umbilical hernia repair (N/A, 08/13/2018); and RIGHT/LEFT HEART CATH AND CORONARY ANGIOGRAPHY (N/A, 07/11/2019). Theresa [name] has a current medication list which includes the following prescription(s): albuterol, aspirin ec, baclofen, cholecalciferol, digoxin, fluticasone-umeclidin-vilant, gabapentin, hydrocodone-acetaminophen, [START ON 03/06/2021] hydrocodone-acetaminophen, [START ON 04/05/2021] hydrocodone-acetaminophen, ipratropium-albuterol, levothyroxine, oxygen-helium, potassium chloride sa, daliresp, sertraline, torsemide, and zaleplon, and the following Facility-Administered Medications: fentanyl and midazolam. Her primarily concern today is the Back Pain (Bilateral lumbar left is worse)  Initial Vital Signs:  Pulse/HCG Rate: 87ECG Heart Rate: 86 Temp: (!) 97.1 F (36.2 C) Resp: 16 BP: 118/70 SpO2: 95 % (3 liters)  BMI: Estimated body mass index is 33.11 kg/m as calculated from the following:   Height as of this encounter: _0  (1.575 m).   Weight as of this encounter: 181 lb (82.1 kg).  Risk Assessment: Allergies: Reviewed. She is allergic to codeine and meloxicam.  Allergy Precautions: None required Coagulopathies: Reviewed. None identified.  Blood-thinner therapy: None at this time Active Infection(s): Reviewed. None identified. Theresa [name] is afebrile  Site Confirmation: Theresa [name] was asked to confirm the procedure and laterality before marking the site Procedure checklist: Completed Consent: Before the procedure and under the influence of no sedative(s), amnesic(s), or anxiolytics, the patient was informed of the treatment options, risks and possible complications. To fulfill our ethical and legal obligations, as  recommended by the American Medical Association's Code of Ethics, I have informed the patient of my clinical impression; the nature and purpose of the treatment or procedure; the risks, benefits, and possible complications of the intervention; the alternatives, including doing  nothing; the risk(s) and benefit(s) of the alternative treatment(s) or procedure(s); and the risk(s) and benefit(s) of doing nothing. The patient was provided information about the general risks and possible complications associated with the procedure. These may include, but are not limited to: failure to achieve desired goals, infection, bleeding, organ or nerve damage, allergic reactions, paralysis, and death. In addition, the patient was informed of those risks and complications associated to Spine-related procedures, such as failure to decrease pain; infection (i.e.: Meningitis, epidural or intraspinal abscess); bleeding (i.e.: epidural hematoma, subarachnoid hemorrhage, or any other type of intraspinal or peri-dural bleeding); organ or nerve damage (i.e.: Any type of peripheral nerve, nerve root, or spinal cord injury) with subsequent damage to sensory, motor, and/or autonomic systems, resulting in permanent pain, numbness, and/or weakness of one or several areas of the body; allergic reactions; (i.e.: anaphylactic reaction); and/or death. Furthermore, the patient was informed of those risks and complications associated with the medications. These include, but are not limited to: allergic reactions (i.e.: anaphylactic or anaphylactoid reaction(s)); adrenal axis suppression; blood sugar elevation that in diabetics may result in ketoacidosis or comma; water retention that in patients with history of congestive heart failure may result in shortness of breath, pulmonary edema, and decompensation with resultant heart failure; weight gain; swelling or edema; medication-induced neural toxicity; particulate matter embolism and blood vessel  occlusion with resultant organ, and/or nervous system infarction; and/or aseptic necrosis of one or more joints. Finally, the patient was informed that Medicine is not an exact science; therefore, there is also the possibility of unforeseen or unpredictable risks and/or possible complications that may result in a catastrophic outcome. The patient indicated having understood very clearly. We have given the patient no guarantees and we have made no promises. Enough time was given to the patient to ask questions, all of which were answered to the patient's satisfaction. Ms. Pulley has indicated that she wanted to continue with the procedure. Attestation: I, the ordering provider, attest that I have discussed with the patient the benefits, risks, side-effects, alternatives, likelihood of achieving goals, and potential problems during recovery for the procedure that I have provided informed consent. Date  Time: 02/11/2021 11:38 AM  Pre-Procedure Preparation:  Monitoring: As per clinic protocol. Respiration, ETCO2, SpO2, BP, heart rate and rhythm monitor placed and checked for adequate function Safety Precautions: Patient was assessed for positional comfort and pressure points before starting the procedure. Time-out: I initiated and conducted the "Time-out" before starting the procedure, as per protocol. The patient was asked to participate by confirming the accuracy of the "Time Out" information. Verification of the correct person, site, and procedure were performed and confirmed by me, the nursing staff, and the patient. "Time-out" conducted as per Joint Commission's Universal Protocol (UP.01.01.01). Time: 1217  Description of Procedure:          Laterality: Bilateral. The procedure was performed in identical fashion on both sides. Levels:  L2, L3, L4, L5, & S1 Medial Branch Level(s) Area Prepped: Posterior Lumbosacral Region DuraPrep (Iodine Povacrylex [0.7% available iodine] and Isopropyl Alcohol, 74%  w/w) Safety Precautions: Aspiration looking for blood return was conducted prior to all injections. At no point did we inject any substances, as a needle was being advanced. Before injecting, the patient was told to immediately notify me if she was experiencing any new onset of "ringing in the ears, or metallic taste in the mouth". No attempts were made at seeking any paresthesias. Safe injection practices and needle disposal techniques used. Medications properly checked  for expiration dates. SDV (single dose vial) medications used. After the completion of the procedure, all disposable equipment used was discarded in the proper designated medical waste containers. Local Anesthesia: Protocol guidelines were followed. The patient was positioned over the fluoroscopy table. The area was prepped in the usual manner. The time-out was completed. The target area was identified using fluoroscopy. A 12-in long, straight, sterile hemostat was used with fluoroscopic guidance to locate the targets for each level blocked. Once located, the skin was marked with an approved surgical skin marker. Once all sites were marked, the skin (epidermis, dermis, and hypodermis), as well as deeper tissues (fat, connective tissue and muscle) were infiltrated with a small amount of a short-acting local anesthetic, loaded on a 10cc syringe with a 25G, 1.5-in  Needle. An appropriate amount of time was allowed for local anesthetics to take effect before proceeding to the next step. Local Anesthetic: Lidocaine 2.0% The unused portion of the local anesthetic was discarded in the proper designated containers. Technical explanation of process:  L2 Medial Branch Nerve Block (MBB): The target area for the L2 medial branch is at the junction of the postero-lateral aspect of the superior articular process and the superior, posterior, and medial edge of the transverse process of L3. Under fluoroscopic guidance, a Quincke needle was inserted until  contact was made with os over the superior postero-lateral aspect of the pedicular shadow (target area). After negative aspiration for blood, 0.5 mL of the nerve block solution was injected without difficulty or complication. The needle was removed intact. L3 Medial Branch Nerve Block (MBB): The target area for the L3 medial branch is at the junction of the postero-lateral aspect of the superior articular process and the superior, posterior, and medial edge of the transverse process of L4. Under fluoroscopic guidance, a Quincke needle was inserted until contact was made with os over the superior postero-lateral aspect of the pedicular shadow (target area). After negative aspiration for blood, 0.5 mL of the nerve block solution was injected without difficulty or complication. The needle was removed intact. L4 Medial Branch Nerve Block (MBB): The target area for the L4 medial branch is at the junction of the postero-lateral aspect of the superior articular process and the superior, posterior, and medial edge of the transverse process of L5. Under fluoroscopic guidance, a Quincke needle was inserted until contact was made with os over the superior postero-lateral aspect of the pedicular shadow (target area). After negative aspiration for blood, 0.5 mL of the nerve block solution was injected without difficulty or complication. The needle was removed intact. L5 Medial Branch Nerve Block (MBB): The target area for the L5 medial branch is at the junction of the postero-lateral aspect of the superior articular process and the superior, posterior, and medial edge of the sacral ala. Under fluoroscopic guidance, a Quincke needle was inserted until contact was made with os over the superior postero-lateral aspect of the pedicular shadow (target area). After negative aspiration for blood, 0.5 mL of the nerve block solution was injected without difficulty or complication. The needle was removed intact. S1 Medial Branch Nerve  Block (MBB): The target area for the S1 medial branch is at the posterior and inferior 6 o'clock position of the L5-S1 facet joint. Under fluoroscopic guidance, the Quincke needle inserted for the L5 MBB was redirected until contact was made with os over the inferior and postero aspect of the sacrum, at the 6 o' clock position under the L5-S1 facet joint (Target area). After negative  aspiration for blood, 0.5 mL of the nerve block solution was injected without difficulty or complication. The needle was removed intact.  Nerve block solution: 0.2% PF-Ropivacaine + Triamcinolone (40 mg/mL) diluted to a final concentration of 4 mg of Triamcinolone/mL of Ropivacaine The unused portion of the solution was discarded in the proper designated containers. Procedural Needles: 22-gauge, 3.5-inch, Quincke needles used for all levels.  Once the entire procedure was completed, the treated area was cleaned, making sure to leave some of the prepping solution back to take advantage of its long term bactericidal properties.      Illustration of the posterior view of the lumbar spine and the posterior neural structures. Laminae of L2 through S1 are labeled. DPRL5, dorsal primary ramus of L5; DPRS1, dorsal primary ramus of S1; DPR3, dorsal primary ramus of L3; FJ, facet (zygapophyseal) joint L3-L4; I, inferior articular process of L4; LB1, lateral branch of dorsal primary ramus of L1; IAB, inferior articular branches from L3 medial branch (supplies L4-L5 facet joint); IBP, intermediate branch plexus; MB3, medial branch of dorsal primary ramus of L3; NR3, third lumbar nerve root; S, superior articular process of L5; SAB, superior articular branches from L4 (supplies L4-5 facet joint also); TP3, transverse process of L3.  Vitals:   02/11/21 1235 02/11/21 1245 02/11/21 1255 02/11/21 1305  BP: 115/66 124/72 112/71 105/68  Pulse:      Resp: _0 Temp: 98.1 F (36.7 C)   98 F (36.7 C)  TempSrc:      SpO2: 98%  98% 95% 95%  Weight:      Height:         Start Time: 1217 hrs. End Time: 1225 hrs.  Imaging Guidance (Spinal):          Type of Imaging Technique: Fluoroscopy Guidance (Spinal) Indication(s): Assistance in needle guidance and placement for procedures requiring needle placement in or near specific anatomical locations not easily accessible without such assistance. Exposure Time: Please see nurses notes. Contrast: None used. Fluoroscopic Guidance: I was personally present during the use of fluoroscopy. "Tunnel Vision Technique" used to obtain the best possible view of the target area. Parallax error corrected before commencing the procedure. "Direction-depth-direction" technique used to introduce the needle under continuous pulsed fluoroscopy. Once target was reached, antero-posterior, oblique, and lateral fluoroscopic projection used confirm needle placement in all planes. Images permanently stored in EMR. Interpretation: No contrast injected. I personally interpreted the imaging intraoperatively. Adequate needle placement confirmed in multiple planes. Permanent images saved into the patient's record.  Antibiotic Prophylaxis:   Anti-infectives (From admission, onward)   None     Indication(s): None identified  Post-operative Assessment:  Post-procedure Vital Signs:  Pulse/HCG Rate: 8780 Temp: 98 F (36.7 C) Resp: 20 BP: 105/68 SpO2: 95 %  EBL: None  Complications: No immediate post-treatment complications observed by team, or reported by patient.  Note: The patient tolerated the entire procedure well. A repeat set of vitals were taken after the procedure and the patient was kept under observation following institutional policy, for this type of procedure. Post-procedural neurological assessment was performed, showing return to baseline, prior to discharge. The patient was provided with post-procedure discharge instructions, including a section on how to identify potential  problems. Should any problems arise concerning this procedure, the patient was given instructions to immediately contact us, at any time, without hesitation. In any case, we plan to contact the patient by telephone for a follow-up status report regarding this interventional procedure.  Comments:  No additional relevant information.  Plan of Care  Orders:  Orders Placed This Encounter  Procedures  . LUMBAR FACET(MEDIAL BRANCH NERVE BLOCK) MBNB    Scheduling Instructions:     Procedure: Lumbar facet block (AKA.: Lumbosacral medial branch nerve block)     Side: Bilateral     Level: L3-4, L4-5, & L5-S1 Facets (L2, L3, L4, L5, & S1 Medial Branch Nerves)     Sedation: Patient's choice.     Timeframe: Today    Order Specific Question:   Where will this procedure be performed?    Answer:   [facility]  . [facility] C-ARM 1-60 MIN NO REPORT    Intraoperative interpretation by procedural physician at [facility].    Standing Status:   Standing    Number of Occurrences:   1    Order Specific Question:   Reason for exam:    Answer:   Assistance in needle guidance and placement for procedures requiring needle placement in or near specific anatomical locations not easily accessible without such assistance.  . Informed Consent Details: Physician/Practitioner Attestation; Transcribe to consent form and obtain patient signature    Nursing Order: Transcribe to consent form and obtain patient signature. Note: Always confirm laterality of pain with Ms. Alwyn Ren, before procedure.    Order Specific Question:   Physician/Practitioner attestation of informed consent for procedure/surgical case    Answer:   I, the physician/practitioner, attest that I have discussed with the patient the benefits, risks, side effects, alternatives, likelihood of achieving goals and potential problems during recovery for the procedure that I have provided informed consent.    Order Specific Question:    Procedure    Answer:   Lumbar Facet Block  under fluoroscopic guidance    Order Specific Question:   Physician/Practitioner performing the procedure    Answer:   Zionna Homewood A. 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  . Provide equipment / supplies at bedside    "Block Tray" (Disposable  single use) Needle type: SpinalSpinal Amount/quantity: 4 Size: Medium (5-inch) Gauge: 22G    Standing Status:   Standing    Number of Occurrences:   1    Order Specific Question:   Specify    Answer:   Block Tray   Chronic Opioid Analgesic:  Hydrocodone/APAP 5/325 one tablet every 8 hours (15 mg/day of hydrocodone) MME/day:15 mg/day.   Medications ordered for procedure: Meds ordered this encounter  Medications  . lidocaine (XYLOCAINE) 2 % (with pres) injection 400 mg  . lactated ringers infusion 1,000 mL  . midazolam (VERSED) 5 MG/5ML injection 1-2 mg    Make sure Flumazenil is available in the pyxis when using this medication. If oversedation occurs, administer 0.2 mg IV over 15 sec. If after 45 sec no response, administer 0.2 mg again over 1 min; may repeat at 1 min intervals; not to exceed 4 doses (1 mg)  . fentaNYL (SUBLIMAZE) injection 25-50 mcg    Make sure Narcan is available in the pyxis when using this medication. In the event of respiratory depression (RR< 8/min): Titrate NARCAN (naloxone) in increments of 0.1 to 0.2 mg IV at 2-3 minute intervals, until desired degree of reversal.  . ropivacaine (PF) 2 mg/mL (0.2%) (NAROPIN) injection 18 mL  . triamcinolone  acetonide (KENALOG-40) injection 80 mg   Medications administered: We administered lidocaine, lactated ringers, midazolam, fentaNYL, ropivacaine (PF) 2 mg/mL (0.2%), and triamcinolone acetonide.  See the medical record for exact  dosing, route, and time of administration.  Follow-up plan:   Return in about 2 weeks (around 02/25/2021) for on afternoon of procedure day, (VV), (PPE).       Interventional Therapies  Risk  Complexity Considerations:   Estimated body mass index is 33.11 kg/m as calculated from the following:   Height as of this encounter: _0  (1.575 m).   Weight as of this encounter: 181 lb (82.1 kg). NOTE: NO further Lumbar RFAuntil BMI <35.   Planned  Pending:   Palliative bilateral lumbar facet block #4 (02/12/2019)    Under consideration:   Palliative procedures    Completed:   Palliativebilaterallumbar facetblock x3 Therapeutic left L2-3 LESI #1 (09/10/2019)  Diagnostic/palliative left L4TFESI x1 Diagnostic/palliative left L4-5 LESI x2 Diagnostic/palliativerightL4 TFESI x1 Diagnostic/palliativerightL4-5 LESI x1 Palliative leftlumbar facet RFAx1(06/27/2017)  Palliative right lumbar facet RFAx1(10/03/2017)   Therapeutic  Palliative (PRN) options:   Palliativebilaterallumbar facetblock #4 Diagnostic/palliative left L4TFESI #2 Diagnostic/palliative left L4-5 LESI #3 Diagnostic/palliativerightL4 TFESI #2 Diagnostic/palliativerightL4-5 LESI #2 Palliative leftlumbar facet RFA#2 Palliative right lumbar facet RFA#2    Recent Visits Date Type Provider Dept  01/27/21 Office Visit Milinda Pointer, MD [facility]  Showing recent visits within past 90 days and meeting all other requirements Today's Visits Date Type Provider Dept  02/11/21 Procedure visit Milinda Pointer, MD [facility]  Showing today's visits and meeting all other requirements Future Appointments Date Type Provider Dept  02/25/21 Appointment Milinda Pointer, [facility]  05/05/21 Appointment Milinda Pointer, MD [facility]  Showing future appointments within next 90 days and meeting all other requirements  Disposition: Discharge  home  Discharge (Date  Time): 02/11/2021; 1310 hrs.   Primary Care Physician: Margo Common, PA-C Location: [facility] Note by: Gaspar Cola, MD Date: 02/11/2021; Time: 4:51 PM  Disclaimer:  Medicine is not an Chief Strategy Officer. The only guarantee in medicine is that nothing is guaranteed. It is important to note that the decision to proceed with this intervention was based on the information collected from the patient. The Data and conclusions were drawn from the patient's questionnaire, the interview, and the physical examination. Because the information was provided in large part by the patient, it cannot be guaranteed that it has not been purposely or unconsciously manipulated. Every effort has been made to obtain as much relevant data as possible for this evaluation. It is important to note that the conclusions that lead to this procedure are derived in large part from the available data. Always take into account that the treatment will also be dependent on availability of resources and existing treatment guidelines, considered by other Pain Management Practitioners as being common knowledge and practice, at the time of the intervention. For Medico-Legal purposes, it is also important to point out that variation in procedural techniques and pharmacological choices are the acceptable norm. The indications, contraindications, technique, and results of the above procedure should only be interpreted and judged by a Board-Certified Interventional Pain Specialist with extensive familiarity and expertise in the same exact procedure and technique.

## 2021-02-11 NOTE — Patient Instructions (Signed)
____________________________________________________________________________________________  Post-Procedure Discharge Instructions  Instructions:  Apply ice:   Purpose: This will minimize any swelling and discomfort after procedure.   When: Day of procedure, as soon as you get home.  How: Fill a plastic sandwich bag with crushed ice. Cover it with a small towel and apply to injection site.  How long: (15 min on, 15 min off) Apply for 15 minutes then remove x 15 minutes.  Repeat sequence on day of procedure, until you go to bed.  Apply heat:   Purpose: To treat any soreness and discomfort from the procedure.  When: Starting the next day after the procedure.  How: Apply heat to procedure site starting the day following the procedure.  How long: May continue to repeat daily, until discomfort goes away.  Food intake: Start with clear liquids (like water) and advance to regular food, as tolerated.   Physical activities: Keep activities to a minimum for the first 8 hours after the procedure. After that, then as tolerated.  Driving: If you have received any sedation, be responsible and do not drive. You are not allowed to drive for 24 hours after having sedation.  Blood thinner: (Applies only to those taking blood thinners) You may restart your blood thinner 6 hours after your procedure.  Insulin: (Applies only to Diabetic patients taking insulin) As soon as you can eat, you may resume your normal dosing schedule.  Infection prevention: Keep procedure site clean and dry. Shower daily and clean area with soap and water.  Post-procedure Pain Diary: Extremely important that this be done correctly and accurately. Recorded information will be used to determine the next step in treatment. For the purpose of accuracy, follow these rules:  Evaluate only the area treated. Do not report or include pain from an untreated area. For the purpose of this evaluation, ignore all other areas of pain,  except for the treated area.  After your procedure, avoid taking a long nap and attempting to complete the pain diary after you wake up. Instead, set your alarm clock to go off every hour, on the hour, for the initial 8 hours after the procedure. Document the duration of the numbing medicine, and the relief you are getting from it.  Do not go to sleep and attempt to complete it later. It will not be accurate. If you received sedation, it is likely that you were given a medication that may cause amnesia. Because of this, completing the diary at a later time may cause the information to be inaccurate. This information is needed to plan your care.  Follow-up appointment: Keep your post-procedure follow-up evaluation appointment after the procedure (usually 2 weeks for most procedures, 6 weeks for radiofrequencies). DO NOT FORGET to bring you pain diary with you.   Expect: (What should I expect to see with my procedure?)  From numbing medicine (AKA: Local Anesthetics): Numbness or decrease in pain. You may also experience some weakness, which if present, could last for the duration of the local anesthetic.  Onset: Full effect within 15 minutes of injected.  Duration: It will depend on the type of local anesthetic used. On the average, 1 to 8 hours.   From steroids (Applies only if steroids were used): Decrease in swelling or inflammation. Once inflammation is improved, relief of the pain will follow.  Onset of benefits: Depends on the amount of swelling present. The more swelling, the longer it will take for the benefits to be seen. In some cases, up to 10 days.  Duration: Steroids will stay in the system x 2 weeks. Duration of benefits will depend on multiple posibilities including persistent irritating factors.  Side-effects: If present, they may typically last 2 weeks (the duration of the steroids).  Frequent: Cramps (if they occur, drink Gatorade and take over-the-counter Magnesium 450-500 mg  once to twice a day); water retention with temporary weight gain; increases in blood sugar; decreased immune system response; increased appetite.  Occasional: Facial flushing (red, warm cheeks); mood swings; menstrual changes.  Uncommon: Long-term decrease or suppression of natural hormones; bone thinning. (These are more common with higher doses or more frequent use. This is why we prefer that our patients avoid having any injection therapies in other practices.)   Very Rare: Severe mood changes; psychosis; aseptic necrosis.  From procedure: Some discomfort is to be expected once the numbing medicine wears off. This should be minimal if ice and heat are applied as instructed.  Call if: (When should I call?)  You experience numbness and weakness that gets worse with time, as opposed to wearing off.  New onset bowel or bladder incontinence. (Applies only to procedures done in the spine)  Emergency Numbers:  Durning business hours (Monday - Thursday, 8:00 AM - 4:00 PM) (Friday, 9:00 AM - 12:00 Noon): [phone]  After hours: [phone]  NOTE: If you are having a problem and are unable connect with, or to talk to a provider, then go to your nearest urgent care or emergency department. If the problem is serious and urgent, please call 911. ____________________________________________________________________________________________

## 2021-02-12 ENCOUNTER — Telehealth: Payer: Self-pay | Admitting: *Deleted

## 2021-02-12 NOTE — Telephone Encounter (Signed)
Called patient re; procedure on yesterday.  Voicemail left that if she has any questions or concerns she can reach the office.

## 2021-02-19 DIAGNOSIS — J9610 Chronic respiratory failure, unspecified whether with hypoxia or hypercapnia: Secondary | ICD-10-CM | POA: Diagnosis not present

## 2021-02-23 DIAGNOSIS — F17200 Nicotine dependence, unspecified, uncomplicated: Secondary | ICD-10-CM | POA: Diagnosis not present

## 2021-02-23 DIAGNOSIS — J449 Chronic obstructive pulmonary disease, unspecified: Secondary | ICD-10-CM | POA: Diagnosis not present

## 2021-02-23 DIAGNOSIS — R0600 Dyspnea, unspecified: Secondary | ICD-10-CM | POA: Diagnosis not present

## 2021-02-23 DIAGNOSIS — Z9981 Dependence on supplemental oxygen: Secondary | ICD-10-CM | POA: Diagnosis not present

## 2021-02-24 NOTE — Progress Notes (Signed)
PROVIDER NOTE: Information contained herein reflects review and annotations entered in association with encounter. Interpretation of such information and data should be left to medically-trained personnel. Information provided to patient can be located elsewhere in the medical record under "Patient Instructions". Document created using STT-dictation technology, any transcriptional errors that may result from process are unintentional.    Patient: Theresa [name]  Service Category: E/M  Provider: Gaspar Cola, MD  DOB: 02-07-1956  DOS: 02/25/2021  Specialty: Interventional Pain Management  MRN: [id number]  Setting: Ambulatory outpatient  PCP: Margo Common, PA-C  Type: Established Patient    Referring Provider: Margo Common, PA-C  Location: Office  Delivery: Face-to-face     HPI  Ms. Geral Tuch, a 65 y.o. year old female, is here today because of her Facet syndrome, lumbar [M47.816]. Ms. Scripter primary complain today is Back Pain (Left lower) Last encounter: My last encounter with her was on 02/11/2021. Pertinent problems: Ms. Wismer has Tendinitis; Neuritis or radiculitis due to rupture of lumbar intervertebral disc; Decreased motor strength; DDD (degenerative disc disease), lumbar; Carpal tunnel syndrome; Neurogenic pain; Neuropathic pain; Musculoskeletal pain; Lumbar spondylosis (L4-5); Chronic low back pain (1ry area of Pain) (Bilateral) (L>R) w/o sciatica; Chronic lower extremity pain (2ry area of Pain) (Left); Chronic lumbar radicular pain (L4 & S1 Dermatome) (Left); Chronic shoulder pain (Right side); Chronic upper extremity pain (Right-sided); Cervical radiculitis (Right side); Abnormal x-ray of lumbar spine; Chronic pain syndrome (significant psychosocial component); 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; 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); Lumbar facet syndrome (Bilateral) (L>R); Chronic sacroiliac joint pain (Bilateral); Lumbar facet  hypertrophy (multilevel); Lumbar spinal stenosis (L4-5); 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); 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); Lumbosacral radiculopathy at L4; Chronic shoulder pain (Left); Arthralgia of acromioclavicular joint (Right); Acromioclavicular joint DJD (Right); Osteoarthritis of shoulder (Right); Chronic hip pain (Tertiary Area of Pain) (Bilateral) (L>R); Chronic lower extremity pain (Bilateral) (L>R); Cervicalgia (Bilateral) (R>L); Spondylosis without myelopathy or radiculopathy, lumbosacral region; Chronic knee pain (Left); Chronic lumbar radiculopathy/radiculitis (L2) (Left); Abnormal MRI, lumbar spine (07/23/2019); Protruded lumbar disc (L2-3) (Left); Numbness and tingling in right hand; Numbness in feet; Weakness of both lower extremities; Generalized muscle weakness; and Left thigh pain on their pertinent problem list. Pain Assessment: Severity of Chronic pain is reported as a 2 /10. Location: Back Lower,Left/denies. Onset: More than a month ago. Quality: Dull. Timing: Constant. Modifying factor(s): nothing. Vitals:  height is _0  (1.575 m) and weight is 177 lb (80.3 kg). Her temperature is 97.2 F (36.2 C) (abnormal). Her blood pressure is 132/73 and her pulse is 90. Her respiration is 18 and oxygen saturation is 92%.   Reason for encounter: post-procedure assessment.  The patient appears to be doing very well after the palliative bilateral lumbar facet block.  She indicates 100% relief of the pain for the duration of the local anesthetic followed by an ongoing 75% improvement in her pain.  She also indicates improvement in her range of motion and function.  For the time being, she indicates doing well and not needing anything else.  We will see her back for her medication management appointment.  She does indicate having a very small area on the left side of her back that occasionally will give her some problems, but she states that is not bad enough to  do anything at this time.  She has done incredibly well losing weight and because of that we have been seeing some improvement and better results  from our procedures.  She walked into the clinic today without requiring the use of a wheelchair, which is something that she was having to do in the past.  Because she has now a BMI of less than 35, and she is getting still very good relief with the diagnostic injections, I believe that she will soon be a perfect candidate for radiofrequency ablation.  However, if she continues to lose the weight and does this well as she is doing right now, she may not need it.  RTCB: 05/05/21 Nonopioids transferred 08/03/2020: Gabapentin and baclofen  Post-Procedure Evaluation  Procedure (02/11/2021): Therapeutic/palliative bilateral lumbar facet MBB #4 under fluoroscopic guidance and IV sedation Pre-procedure pain level: 7/10 Post-procedure: 2/10 (100% relief)  Sedation: Sedation provided.  Effectiveness during initial hour after procedure(Ultra-Short Term Relief): 100 %.  Local anesthetic used: Long-acting (4-6 hours) Effectiveness: Defined as any analgesic benefit obtained secondary to the administration of local anesthetics. This carries significant diagnostic value as to the etiological location, or anatomical origin, of the pain. Duration of benefit is expected to coincide with the duration of the local anesthetic used.  Effectiveness during initial 4-6 hours after procedure(Short-Term Relief): 100 %.  Long-term benefit: Defined as any relief past the pharmacologic duration of the local anesthetics.  Effectiveness past the initial 6 hours after procedure(Long-Term Relief): 75 % (ongoing, has 1 spot on left lower back that still causes pain.).  Current benefits: Defined as benefit that persist at this time.   Analgesia:  The patient is currently enjoying an ongoing 75% improvement in her pain except for 1 little spot on the left side of her lower back that  appears to still be giving her some discomfort. Function: Ms. Ledin reports improvement in function ROM: Ms. Schellinger reports improvement in ROM  Pharmacotherapy Assessment   Analgesic: Hydrocodone/APAP 5/325 one tablet every 8 hours (15 mg/day of hydrocodone) MME/day:15 mg/day.   Monitoring: [place] PMP: PDMP reviewed during this encounter.       Pharmacotherapy: No side-effects or adverse reactions reported. Compliance: No problems identified. Effectiveness: Clinically acceptable.  Dewayne Shorter, RN  02/25/2021 11:04 AM  Signed Safety precautions to be maintained throughout the outpatient stay will include: orient to surroundings, keep bed in low position, maintain call bell within reach at all times, provide assistance with transfer out of bed and ambulation.    UDS:  Summary  Date Value Ref Range Status  04/14/2020 Note  Final    Comment:    ==================================================================== [employer] [facility] (MW) ==================================================================== Test                             Result       Flag       Units  Drug Present and Declared for Prescription Verification   Hydrocodone                    2142         EXPECTED   ng/mg creat   Hydromorphone                  263          EXPECTED   ng/mg creat   Dihydrocodeine                 156          EXPECTED   ng/mg creat   Norhydrocodone  2151         EXPECTED   ng/mg creat    Sources of hydrocodone include scheduled prescription medications.    Hydromorphone, dihydrocodeine and norhydrocodone are expected    metabolites of hydrocodone. Hydromorphone and dihydrocodeine are    also available as scheduled prescription medications.  ==================================================================== Test                      Result    Flag   Units      Ref Range   Creatinine              43               mg/dL       >=20 ==================================================================== Declared Medications:  The flagging and interpretation on this report are based on the  following declared medications.  Unexpected results may arise from  inaccuracies in the declared medications.   **Note: The testing scope of this panel includes these medications:   Hydrocodone (Norco)   **Note: The testing scope of this panel does not include the  following reported medications:   Acetaminophen (Norco)  Albuterol (Ventolin HFA)  Albuterol (Duoneb)  Aspirin  Baclofen (Lioresal)  Cholecalciferol  Digoxin (Lanoxin)  Fluticasone  Gabapentin (Neurontin)  Ipratropium (Duoneb)  Levothyroxine (Synthroid)  Potassium (Klor-Con)  Sertraline (Zoloft)  Torsemide (Demadex)  Umeclidinium  Vilanterol ==================================================================== For clinical consultation, please call [phone]. ====================================================================      ROS  Constitutional: Denies any fever or chills Gastrointestinal: No reported hemesis, hematochezia, vomiting, or acute GI distress Musculoskeletal: Denies any acute onset joint swelling, redness, loss of ROM, or weakness Neurological: No reported episodes of acute onset apraxia, aphasia, dysarthria, agnosia, amnesia, paralysis, loss of coordination, or loss of consciousness  Medication Review  Cholecalciferol, Fluticasone-Umeclidin-Vilant, HYDROcodone-acetaminophen, Oxygen-Helium, Roflumilast, albuterol, aspirin EC, baclofen, digoxin, gabapentin, ipratropium-albuterol, levothyroxine, potassium chloride SA, sertraline, torsemide, and zaleplon  History Review  Allergy: Ms. Hittle is allergic to codeine and meloxicam. Drug: Ms. Eppinger  reports no history of drug use. Alcohol:  reports no history of alcohol use. Tobacco:  reports that she has been smoking cigarettes. She has a 22.00 pack-year smoking history. She has  never used smokeless tobacco. Social: Ms. Olarte  reports that she has been smoking cigarettes. She has a 22.00 pack-year smoking history. She has never used smokeless tobacco. She reports that she does not drink alcohol and does not use drugs. Medical:  has a past medical history of Acute postoperative pain (10/03/2017), Anxiety, BiPAP (biphasic positive airway pressure) dependence, Carpal tunnel syndrome, CHF (congestive heart failure) ([place]), CHF (congestive heart failure) ([place]), Chronic generalized abdominal pain, Chronic rhinitis, COPD (chronic obstructive pulmonary disease) ([place]), DDD (degenerative disc disease), cervical, DDD (degenerative disc disease), lumbosacral, Depression, Dyspnea, Edema, Flu, Gastritis, GERD (gastroesophageal reflux disease), Hematuria, Hernia of abdominal wall (07/17/2018), Hernia, abdominal, Hypertension, Kidney stones, Low back pain, Lumbar radiculitis, Malodorous urine, Muscle weakness, Obesity, Oxygen dependent, Renal cyst, Sensory urge incontinence, Thyroid activity decreased, Tobacco abuse, and Wheezing. Surgical: Ms. Sopher  has a past surgical history that includes Abdominal hysterectomy; Tonsillectomy; Carpal tunnel release (Left, 2012); Tubal ligation; epigastric hernia repair (N/A, 08/13/2018); Umbilical hernia repair (N/A, 08/13/2018); and RIGHT/LEFT HEART CATH AND CORONARY ANGIOGRAPHY (N/A, 07/11/2019). Family: family history includes Alcohol abuse in her father; Breast cancer in her sister; Cancer in her brother, brother, and sister; Coronary artery disease in her mother; Heart disease in her father and mother; Lung cancer in her sister; Pneumonia in her brother;  Stroke in her mother.  Laboratory Chemistry Profile   Renal Lab Results  Component Value Date   BUN 14 02/04/2021   CREATININE 0.80 02/04/2021   BCR 16 03/04/2020   GFRAA >60 06/26/2020   GFRNONAA >60 02/04/2021     Hepatic Lab Results  Component Value Date   AST 21 06/25/2020   ALT 22  06/25/2020   ALBUMIN 3.9 06/25/2020   ALKPHOS 94 06/25/2020   LIPASE 31 10/17/2017   AMMONIA 40 (H) 10/11/2019     Electrolytes Lab Results  Component Value Date   NA 136 02/04/2021   K 3.8 02/04/2021   CL 100 02/04/2021   CALCIUM 9.1 02/04/2021   MG 2.7 (H) 02/10/2020   PHOS 4.0 02/10/2020     Bone Lab Results  Component Value Date   25OHVITD1 13 (L) 09/17/2015   25OHVITD2 <1.0 09/17/2015   25OHVITD3 13 09/17/2015     Inflammation (CRP: Acute Phase) (ESR: Chronic Phase) Lab Results  Component Value Date   CRP 4.2 (H) 02/08/2020   ESRSEDRATE 16 08/08/2017   LATICACIDVEN 1.4 02/03/2020       Note: Above Lab results reviewed.  Recent Imaging Review  DG PAIN CLINIC C-ARM 1-60 MIN NO REPORT Fluoro was used, but no Radiologist interpretation will be provided.  Please refer to "NOTES" tab for provider progress note. Note: Reviewed        Physical Exam  General appearance: Well nourished, well developed, and well hydrated. In no apparent acute distress Mental status: Alert, oriented x 3 (person, place, & time)       Respiratory: No evidence of acute respiratory distress Eyes: PERLA Vitals: BP 132/73   Pulse 90   Temp (!) 97.2 F (36.2 C)   Resp 18   Ht _0  (1.575 m)   Wt 177 lb (80.3 kg)   SpO2 92%   BMI 32.37 kg/m  BMI: Estimated body mass index is 32.37 kg/m as calculated from the following:   Height as of this encounter: _1  (1.575 m).   Weight as of this encounter: 177 lb (80.3 kg). Ideal: Ideal body weight: 50.1 kg (110 lb 7.2 oz) Adjusted ideal body weight: 62.2 kg (137 lb 1.1 oz)  Assessment   Status Diagnosis  Controlled Controlled Controlled 1. Lumbar facet syndrome (Bilateral) (L>R)   2. Chronic low back pain (1ry area of Pain) (Bilateral) (L>R) w/o sciatica   3. Chronic hip pain (Tertiary Area of Pain) (Bilateral) (L>R)   4. Left thigh pain   5. Chronic lower extremity pain (Secondary Area of Pain) (Left)      Updated Problems: No  problems updated.  Plan of Care  Problem-specific:  No problem-specific Assessment & Plan notes found for this encounter.  Ms. Ryleigh Esqueda has a current medication list which includes the following long-term medication(s): albuterol, digoxin, hydrocodone-acetaminophen, [START ON 03/06/2021] hydrocodone-acetaminophen, [START ON 04/05/2021] hydrocodone-acetaminophen, ipratropium-albuterol, levothyroxine, potassium chloride sa, sertraline, torsemide, zaleplon, baclofen, and gabapentin.  Pharmacotherapy (Medications Ordered): No orders of the defined types were placed in this encounter.  Orders:  No orders of the defined types were placed in this encounter.  Follow-up plan:   Return in about 2 months (around 05/05/2021) for evaluation day, (40-min), (MM).      Interventional Therapies  Risk  Complexity Considerations:   Estimated body mass index is 33.11 kg/m as calculated from the following:   Height as of this encounter: _2  (1.575 m).   Weight as of this encounter: 181 lb (82.1 kg).  NOTE: NO further Lumbar RFAuntil BMI <35.   Planned  Pending:   Palliative bilateral lumbar facet block #4 (02/12/2019)    Under consideration:   Palliative procedures    Completed:   Palliativebilaterallumbar facetblock x3 Therapeutic left L2-3 LESI #1 (09/10/2019)  Diagnostic/palliative left L4TFESI x1 Diagnostic/palliative left L4-5 LESI x2 Diagnostic/palliativerightL4 TFESI x1 Diagnostic/palliativerightL4-5 LESI x1 Palliative leftlumbar facet RFAx1(06/27/2017)  Palliative right lumbar facet RFAx1(10/03/2017)   Therapeutic  Palliative (PRN) options:   Palliativebilaterallumbar facetblock #4 Diagnostic/palliative left L4TFESI #2 Diagnostic/palliative left L4-5 LESI #3 Diagnostic/palliativerightL4 TFESI #2 Diagnostic/palliativerightL4-5 LESI #2 Palliative leftlumbar facet RFA#2 Palliative right lumbar facet RFA#2     Recent Visits Date Type  Provider Dept  02/11/21 Procedure visit Milinda Pointer, MD [facility]  01/27/21 Office Visit Milinda Pointer, MD [facility]  Showing recent visits within past 90 days and meeting all other requirements Today's Visits Date Type Provider Dept  02/25/21 Office Visit Milinda Pointer, MD [facility]  Showing today's visits and meeting all other requirements Future Appointments Date Type Provider Dept  05/05/21 Appointment Milinda Pointer, MD [facility]  Showing future appointments within next 90 days and meeting all other requirements  I discussed the assessment and treatment plan with the patient. The patient was provided an opportunity to ask questions and all were answered. The patient agreed with the plan and demonstrated an understanding of the instructions.  Patient advised to call back or seek an in-person evaluation if the symptoms or condition worsens.  Duration of encounter: 25 minutes.  Note by: Gaspar Cola, MD Date: 02/25/2021; Time: 12:23 PM

## 2021-02-25 ENCOUNTER — Other Ambulatory Visit: Payer: Self-pay

## 2021-02-25 ENCOUNTER — Ambulatory Visit: Payer: Medicare HMO | Attending: Pain Medicine | Admitting: Pain Medicine

## 2021-02-25 ENCOUNTER — Encounter: Payer: Self-pay | Admitting: Pain Medicine

## 2021-02-25 VITALS — BP 132/73 | HR 90 | Temp 97.2°F | Resp 18 | Ht 62.0 in | Wt 177.0 lb

## 2021-02-25 DIAGNOSIS — M79605 Pain in left leg: Secondary | ICD-10-CM | POA: Diagnosis not present

## 2021-02-25 DIAGNOSIS — M25552 Pain in left hip: Secondary | ICD-10-CM | POA: Diagnosis not present

## 2021-02-25 DIAGNOSIS — M545 Low back pain, unspecified: Secondary | ICD-10-CM | POA: Diagnosis not present

## 2021-02-25 DIAGNOSIS — M25551 Pain in right hip: Secondary | ICD-10-CM | POA: Insufficient documentation

## 2021-02-25 DIAGNOSIS — M79652 Pain in left thigh: Secondary | ICD-10-CM

## 2021-02-25 DIAGNOSIS — G8929 Other chronic pain: Secondary | ICD-10-CM | POA: Diagnosis not present

## 2021-02-25 DIAGNOSIS — M47816 Spondylosis without myelopathy or radiculopathy, lumbar region: Secondary | ICD-10-CM | POA: Insufficient documentation

## 2021-02-25 NOTE — Progress Notes (Signed)
Safety precautions to be maintained throughout the outpatient stay will include: orient to surroundings, keep bed in low position, maintain call bell within reach at all times, provide assistance with transfer out of bed and ambulation.

## 2021-03-01 ENCOUNTER — Other Ambulatory Visit: Payer: Self-pay | Admitting: Family Medicine

## 2021-03-05 ENCOUNTER — Other Ambulatory Visit: Payer: Self-pay | Admitting: Pain Medicine

## 2021-03-05 DIAGNOSIS — G894 Chronic pain syndrome: Secondary | ICD-10-CM

## 2021-03-08 DIAGNOSIS — J449 Chronic obstructive pulmonary disease, unspecified: Secondary | ICD-10-CM | POA: Diagnosis not present

## 2021-03-16 IMAGING — MG MM DIGITAL SCREENING BILAT W/ TOMO AND CAD
8 series · 8 of 24 positions shown · non-contrast
Comparison: Previous exam(s).

CLINICAL DATA: Screening.

EXAM:
DIGITAL SCREENING BILATERAL MAMMOGRAM WITH TOMO AND CAD

[L MLO synth-2D]
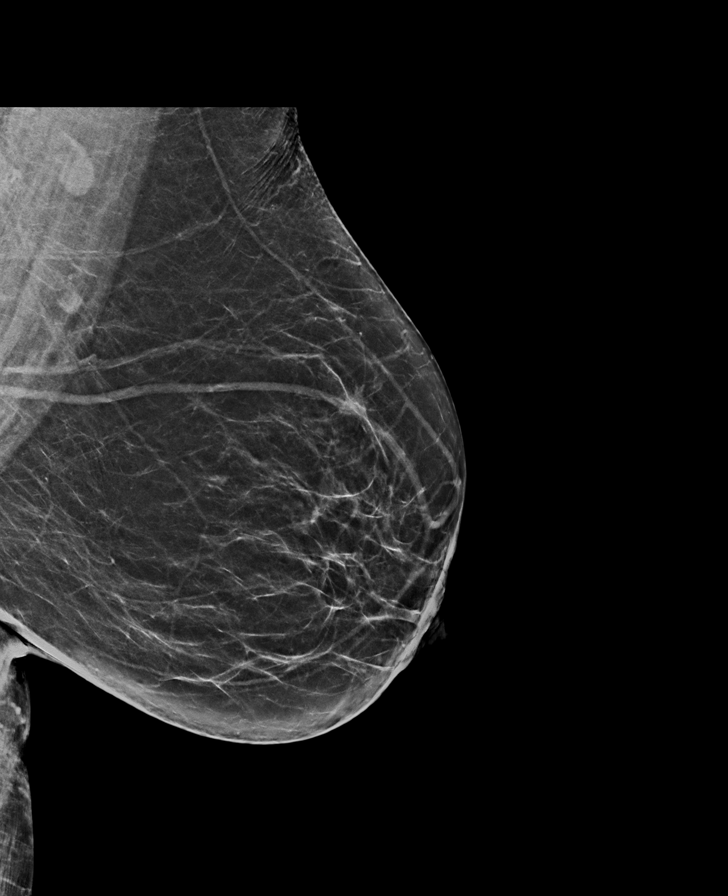

[L CC synth-2D]
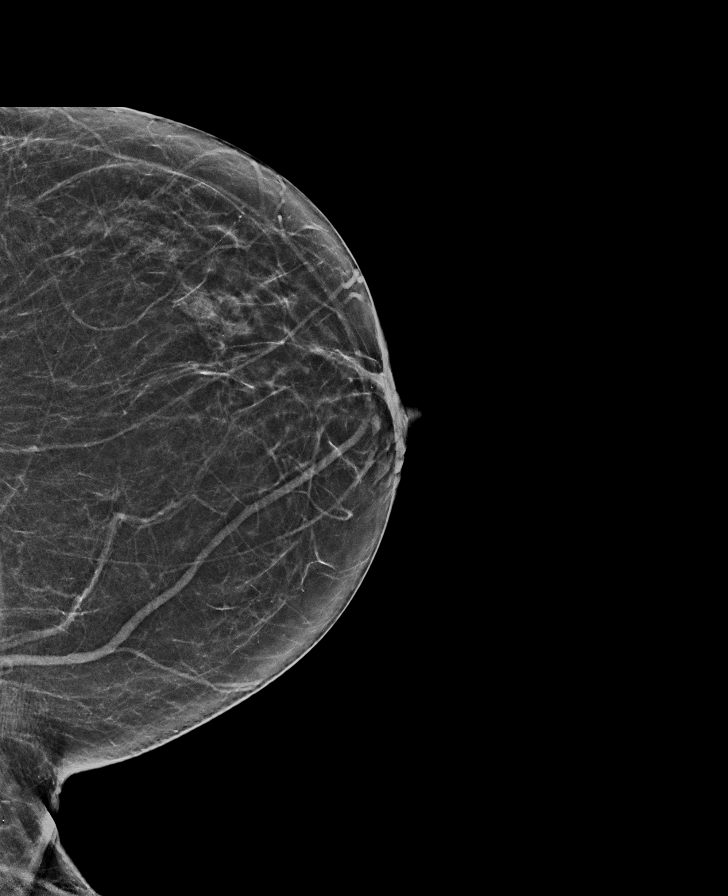

[R CC synth-2D]
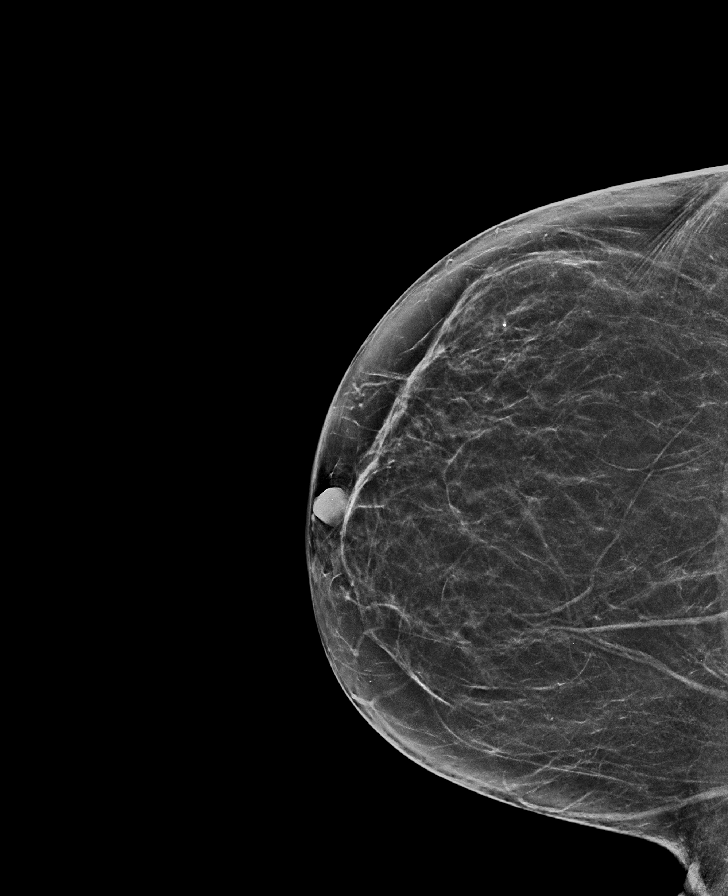

[R MLO synth-2D]
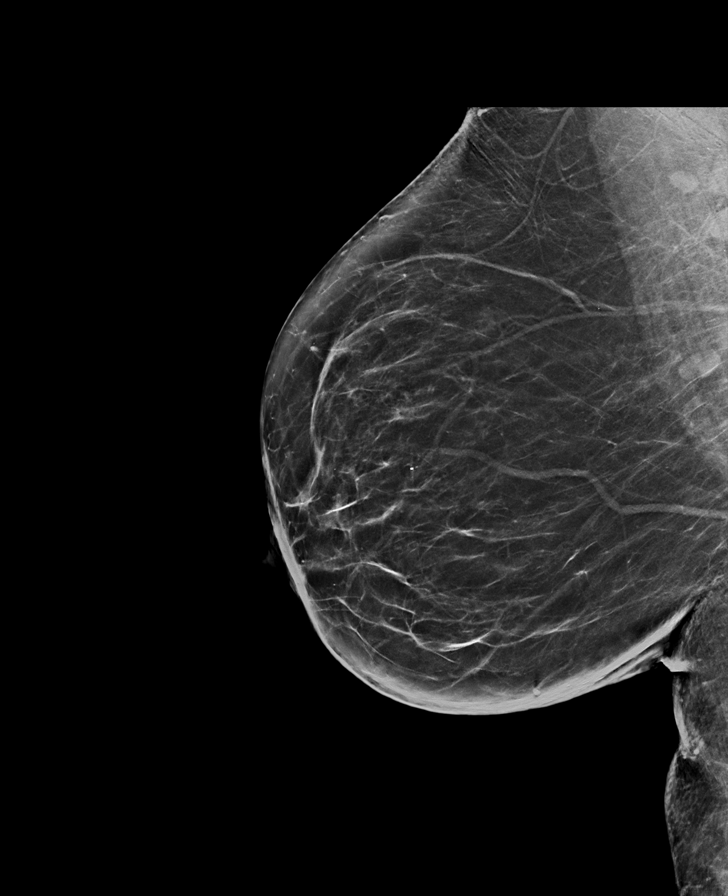

[L CC tomo · tomo slice 29/57.0]
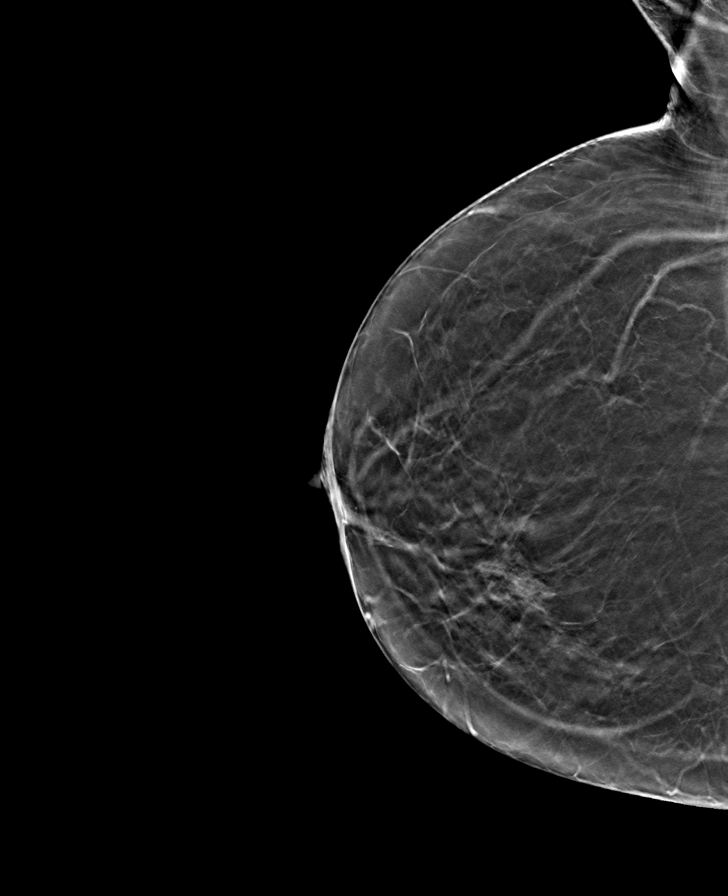

[L MLO tomo · tomo slice 34/67.0]
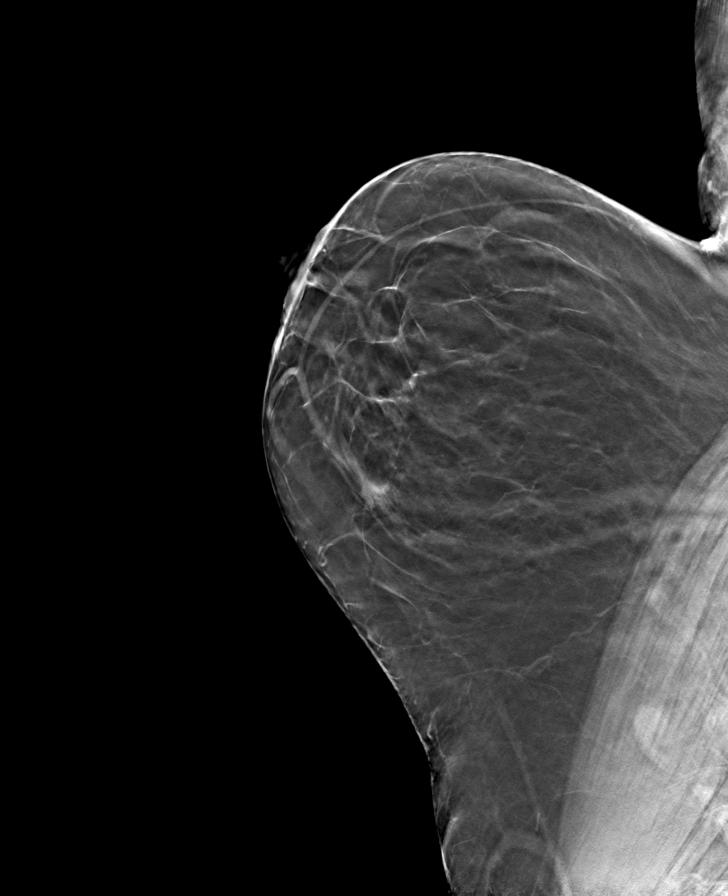

[R MLO tomo · tomo slice 37/74.0]
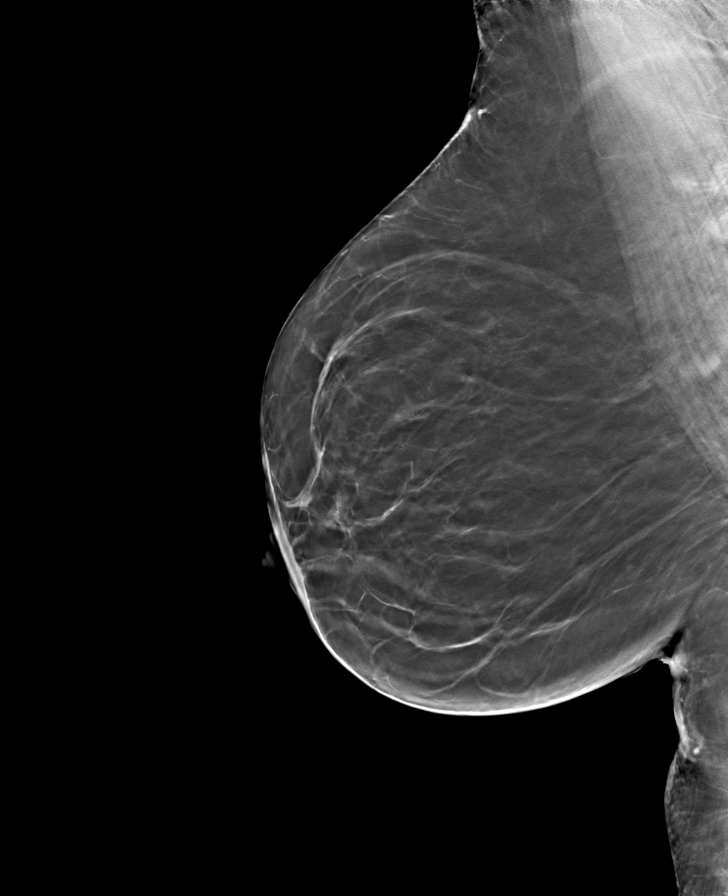

[R CC tomo · tomo slice 33/64.0]
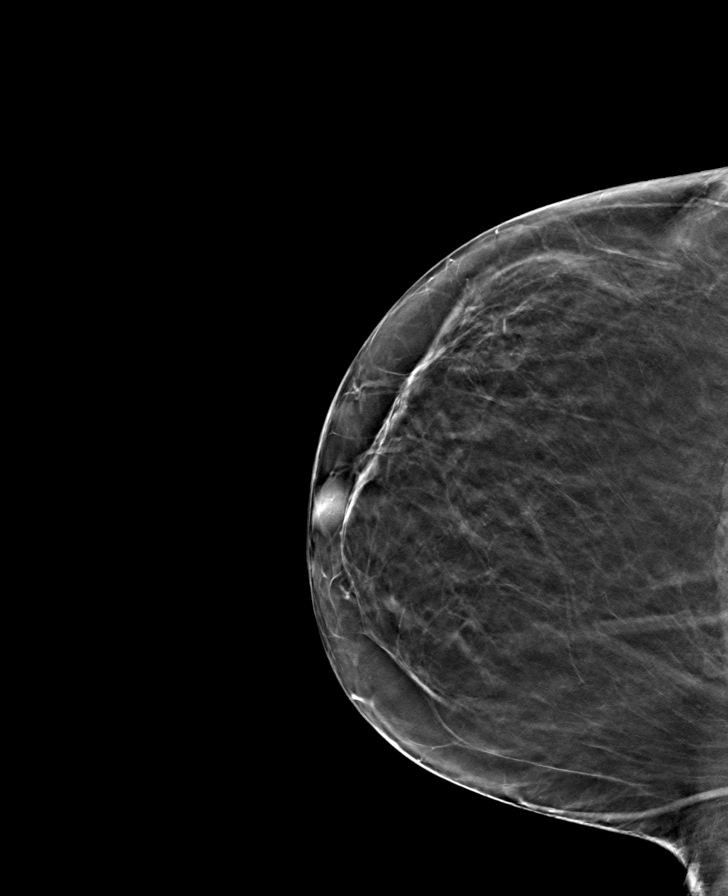

[8 of 24 positions shown; findings below may reference images not displayed]

ACR Breast Density Category b: There are scattered areas of
fibroglandular density.
FINDINGS: There are no findings suspicious for malignancy. The images were
evaluated with computer-aided detection.
IMPRESSION: No mammographic evidence of malignancy. A result letter of this
screening mammogram will be mailed directly to the patient.

RECOMMENDATION:
Screening mammogram in one year. (Code:ZP-[id number])

BI-RADS CATEGORY  1: Negative.

## 2021-03-22 DIAGNOSIS — J9610 Chronic respiratory failure, unspecified whether with hypoxia or hypercapnia: Secondary | ICD-10-CM | POA: Diagnosis not present

## 2021-03-31 ENCOUNTER — Telehealth: Payer: Self-pay

## 2021-03-31 NOTE — Telephone Encounter (Signed)
Left message for patient to notify them that it is time to schedule annual low dose lung cancer screening CT scan. Instructed patient to call back [phone]) to verify information and schedule.

## 2021-04-08 DIAGNOSIS — J449 Chronic obstructive pulmonary disease, unspecified: Secondary | ICD-10-CM | POA: Diagnosis not present

## 2021-04-21 DIAGNOSIS — J9610 Chronic respiratory failure, unspecified whether with hypoxia or hypercapnia: Secondary | ICD-10-CM | POA: Diagnosis not present

## 2021-05-05 ENCOUNTER — Ambulatory Visit: Payer: Medicare HMO | Attending: Pain Medicine | Admitting: Pain Medicine

## 2021-05-05 ENCOUNTER — Encounter: Payer: Medicare HMO | Admitting: Pain Medicine

## 2021-05-05 ENCOUNTER — Other Ambulatory Visit: Payer: Self-pay

## 2021-05-05 ENCOUNTER — Encounter: Payer: Self-pay | Admitting: Pain Medicine

## 2021-05-05 VITALS — BP 123/78 | HR 90 | Temp 97.3°F | Ht 62.0 in | Wt 177.0 lb

## 2021-05-05 DIAGNOSIS — M79605 Pain in left leg: Secondary | ICD-10-CM | POA: Diagnosis not present

## 2021-05-05 DIAGNOSIS — M545 Low back pain, unspecified: Secondary | ICD-10-CM | POA: Insufficient documentation

## 2021-05-05 DIAGNOSIS — M25562 Pain in left knee: Secondary | ICD-10-CM | POA: Diagnosis not present

## 2021-05-05 DIAGNOSIS — Z79899 Other long term (current) drug therapy: Secondary | ICD-10-CM | POA: Diagnosis not present

## 2021-05-05 DIAGNOSIS — G8929 Other chronic pain: Secondary | ICD-10-CM

## 2021-05-05 DIAGNOSIS — Z79891 Long term (current) use of opiate analgesic: Secondary | ICD-10-CM

## 2021-05-05 DIAGNOSIS — G894 Chronic pain syndrome: Secondary | ICD-10-CM

## 2021-05-05 DIAGNOSIS — M25511 Pain in right shoulder: Secondary | ICD-10-CM

## 2021-05-05 DIAGNOSIS — M47817 Spondylosis without myelopathy or radiculopathy, lumbosacral region: Secondary | ICD-10-CM

## 2021-05-05 DIAGNOSIS — F419 Anxiety disorder, unspecified: Secondary | ICD-10-CM

## 2021-05-05 DIAGNOSIS — M47816 Spondylosis without myelopathy or radiculopathy, lumbar region: Secondary | ICD-10-CM | POA: Diagnosis not present

## 2021-05-05 DIAGNOSIS — M25551 Pain in right hip: Secondary | ICD-10-CM

## 2021-05-05 DIAGNOSIS — M25512 Pain in left shoulder: Secondary | ICD-10-CM | POA: Diagnosis present

## 2021-05-05 DIAGNOSIS — M25552 Pain in left hip: Secondary | ICD-10-CM | POA: Diagnosis not present

## 2021-05-05 DIAGNOSIS — M79601 Pain in right arm: Secondary | ICD-10-CM | POA: Diagnosis not present

## 2021-05-05 MED ORDER — HYDROCODONE-ACETAMINOPHEN 5-325 MG PO TABS: 1.0000 | ORAL_TABLET | Freq: Three times a day (TID) | ORAL | 0 refills | Status: DC | PRN

## 2021-05-05 MED ORDER — DIAZEPAM 10 MG PO TABS: 10.0000 mg | ORAL_TABLET | ORAL | 0 refills | Status: DC

## 2021-05-05 NOTE — Patient Instructions (Addendum)
____________________________________________________________________________________________  Preparing for your procedure (without sedation)  Procedure appointments are limited to planned procedures: No Prescription Refills. No disability issues will be discussed. No medication changes will be discussed.  Instructions: Oral Intake: Do not eat or drink anything for at least 6 hours prior to your procedure. (Exception: Blood Pressure Medication. See below.) Transportation: Unless otherwise stated by your physician, you may drive yourself after the procedure. Blood Pressure Medicine: Do not forget to take your blood pressure medicine with a sip of water the morning of the procedure. If your Diastolic (lower reading)is above 100 mmHg, elective cases will be cancelled/rescheduled. Blood thinners: These will need to be stopped for procedures. Notify our staff if you are taking any blood thinners. Depending on which one you take, there will be specific instructions on how and when to stop it. Diabetics on insulin: Notify the staff so that you can be scheduled 1st case in the morning. If your diabetes requires high dose insulin, take only  of your normal insulin dose the morning of the procedure and notify the staff that you have done so. Preventing infections: Shower with an antibacterial soap the morning of your procedure.  Build-up your immune system: Take 1000 mg of Vitamin C with every meal (3 times a day) the day prior to your procedure. Antibiotics: Inform the staff if you have a condition or reason that requires you to take antibiotics before dental procedures. Pregnancy: If you are pregnant, call and cancel the procedure. Sickness: If you have a cold, fever, or any active infections, call and cancel the procedure. Arrival: You must be in the facility at least 30 minutes prior to your scheduled procedure. Children: Do not bring any children with you. Dress appropriately: Bring dark clothing  that you would not mind if they get stained. Valuables: Do not bring any jewelry or valuables.  Reasons to call and reschedule or cancel your procedure: (Following these recommendations will minimize the risk of a serious complication.) Surgeries: Avoid having procedures within 2 weeks of any surgery. (Avoid for 2 weeks before or after any surgery). Flu Shots: Avoid having procedures within 2 weeks of a flu shots or . (Avoid for 2 weeks before or after immunizations). Barium: Avoid having a procedure within 7-10 days after having had a radiological study involving the use of radiological contrast. (Myelograms, Barium swallow or enema study). Heart attacks: Avoid any elective procedures or surgeries for the initial 6 months after a "Myocardial Infarction" (Heart Attack). Blood thinners: It is imperative that you stop these medications before procedures. Let us know if you if you take any blood thinner.  Infection: Avoid procedures during or within two weeks of an infection (including chest colds or gastrointestinal problems). Symptoms associated with infections include: Localized redness, fever, chills, night sweats or profuse sweating, burning sensation when voiding, cough, congestion, stuffiness, runny nose, sore throat, diarrhea, nausea, vomiting, cold or Flu symptoms, recent or current infections. It is specially important if the infection is over the area that we intend to treat. Heart and lung problems: Symptoms that may suggest an active cardiopulmonary problem include: cough, chest pain, breathing difficulties or shortness of breath, dizziness, ankle swelling, uncontrolled high or unusually low blood pressure, and/or palpitations. If you are experiencing any of these symptoms, cancel your procedure and contact your primary care physician for an evaluation.  Remember:  Regular Business hours are:  Monday to Thursday 8:00 AM to 4:00 PM  Provider's Schedule: Milinda Pointer, MD:  Procedure  days: Tuesday and  Thursday 7:30 AM to 4:00 PM  Gillis Santa, MD:  Procedure days: Monday and Wednesday 7:30 AM to 4:00 PM ____________________________________________________________________________________________  ____________________________________________________________________________________________  General Risks and Possible Complications  Patient Responsibilities: It is important that you read this as it is part of your informed consent. It is our duty to inform you of the risks and possible complications associated with treatments offered to you. It is your responsibility as a patient to read this and to ask questions about anything that is not clear or that you believe was not covered in this document.  Patient's Rights: You have the right to refuse treatment. You also have the right to change your mind, even after initially having agreed to have the treatment done. However, under this last option, if you wait until the last second to change your mind, you may be charged for the materials used up to that point.  Introduction: Medicine is not an Chief Strategy Officer. Everything in Medicine, including the lack of treatment(s), carries the potential for danger, harm, or loss (which is by definition: Risk). In Medicine, a complication is a secondary problem, condition, or disease that can aggravate an already existing one. All treatments carry the risk of possible complications. The fact that a side effects or complications occurs, does not imply that the treatment was conducted incorrectly. It must be clearly understood that these can happen even when everything is done following the highest safety standards.  No treatment: You can choose not to proceed with the proposed treatment alternative. The "PRO(s)" would include: avoiding the risk of complications associated with the therapy. The "CON(s)" would include: not getting any of the treatment benefits. These benefits fall under one of three  categories: diagnostic; therapeutic; and/or palliative. Diagnostic benefits include: getting information which can ultimately lead to improvement of the disease or symptom(s). Therapeutic benefits are those associated with the successful treatment of the disease. Finally, palliative benefits are those related to the decrease of the primary symptoms, without necessarily curing the condition (example: decreasing the pain from a flare-up of a chronic condition, such as incurable terminal cancer).  General Risks and Complications: These are associated to most interventional treatments. They can occur alone, or in combination. They fall under one of the following six (6) categories: no benefit or worsening of symptoms; bleeding; infection; nerve damage; allergic reactions; and/or death. No benefits or worsening of symptoms: In Medicine there are no guarantees, only probabilities. No healthcare provider can ever guarantee that a medical treatment will work, they can only state the probability that it may. Furthermore, there is always the possibility that the condition may worsen, either directly, or indirectly, as a consequence of the treatment. Bleeding: This is more common if the patient is taking a blood thinner, either prescription or over the counter (example: Goody Powders, Fish oil, Aspirin, Garlic, etc.), or if suffering a condition associated with impaired coagulation (example: [name], cirrhosis of the liver, low platelet counts, etc.). However, even if you do not have one on these, it can still happen. If you have any of these conditions, or take one of these drugs, make sure to notify your treating physician. Infection: This is more common in patients with a compromised immune system, either due to disease (example: diabetes, cancer, human immunodeficiency virus [HIV], etc.), or due to medications or treatments (example: therapies used to treat cancer and rheumatological diseases). However, even if you  do not have one on these, it can still happen. If you have any of these conditions, or  take one of these drugs, make sure to notify your treating physician. Nerve Damage: This is more common when the treatment is an invasive one, but it can also happen with the use of medications, such as those used in the treatment of cancer. The damage can occur to small secondary nerves, or to large primary ones, such as those in the spinal cord and brain. This damage may be temporary or permanent and it may lead to impairments that can range from temporary numbness to permanent paralysis and/or brain death. Allergic Reactions: Any time a substance or material comes in contact with our body, there is the possibility of an allergic reaction. These can range from a mild skin rash (contact dermatitis) to a severe systemic reaction (anaphylactic reaction), which can result in death. Death: In general, any medical intervention can result in death, most of the time due to an unforeseen complication. ____________________________________________________________________________________________ Preparing for Procedure with Sedation Instructions: Oral Intake: Do not eat or drink anything for at least 8 hours prior to your procedure. Transportation: Public transportation is not allowed. Bring an adult driver. The driver must be physically present in our waiting room before any procedure can be started. Physical Assistance: Bring an adult capable of physically assisting you, in the event you need help. Blood Pressure Medicine: Take your blood pressure medicine with a sip of water the morning of the procedure. Insulin: Take only  of your normal insulin dose. Preventing infections: Shower with an antibacterial soap the morning of your procedure. Build-up your immune system: Take 1000 mg of Vitamin C with every meal (3 times a day) the day prior to your procedure. Pregnancy: If you are pregnant, call and cancel the  procedure. Sickness: If you have a cold, fever, or any active infections, call and cancel the procedure. Arrival: You must be in the facility at least 30 minutes prior to your scheduled procedure. Children: Do not bring children with you. Dress appropriately: Bring dark clothing that you would not mind if they get stained. Valuables: Do not bring any jewelry or valuables. Procedure appointments are reserved for interventional treatments only. No Prescription Refills. No medication changes will be discussed during procedure appointments. No disability issues will be discussed.

## 2021-05-05 NOTE — Progress Notes (Signed)
Nursing Pain Medication Assessment:  Safety precautions to be maintained throughout the outpatient stay will include: orient to surroundings, keep bed in low position, maintain call bell within reach at all times, provide assistance with transfer out of bed and ambulation.  Medication Inspection Compliance: Pill count conducted under aseptic conditions, in front of the patient. Neither the pills nor the bottle was removed from the patient's sight at any time. Once count was completed pills were immediately returned to the patient in their original bottle.  Medication: Hydrocodone/APAP Pill/Patch Count:  5 of 90 pills remain Pill/Patch Appearance: Markings consistent with prescribed medication Bottle Appearance: Standard pharmacy container. Clearly labeled. Filled Date: 6 / 20 / 2022 Last Medication intake:  Today Safety precautions to be maintained throughout the outpatient stay will include: orient to surroundings, keep bed in low position, maintain call bell within reach at all times, provide assistance with transfer out of bed and ambulation.

## 2021-05-05 NOTE — Progress Notes (Signed)
PROVIDER NOTE: Information contained herein reflects review and annotations entered in association with encounter. Interpretation of such information and data should be left to medically-trained personnel. Information provided to patient can be located elsewhere in the medical record under "Patient Instructions". Document created using STT-dictation technology, any transcriptional errors that may result from process are unintentional.    Patient: Theresa [name]  Service Category: E/M  Provider: Gaspar Cola, MD  DOB: 03-31-1956  DOS: 05/05/2021  Specialty: Interventional Pain Management  MRN: [id number]  Setting: Ambulatory outpatient  PCP: Margo Common, PA-C  Type: Established Patient    Referring Provider: Margo Common, PA-C  Location: Office  Delivery: Face-to-face     HPI  Theresa [name], a 65 y.o. year old female, is here today because of her Chronic pain syndrome [G89.4]. [name] primary complain today is Back Pain (left) Last encounter: My last encounter with her was on 03/05/2021. Pertinent problems: [name] has Tendinitis; Neuritis or radiculitis due to rupture of lumbar intervertebral disc; Decreased motor strength; DDD (degenerative disc disease), lumbar; Carpal tunnel syndrome; Neurogenic pain; Neuropathic pain; Musculoskeletal pain; Lumbar spondylosis (L4-5); Chronic low back pain (1ry area of Pain) (Bilateral) (L>R) w/o sciatica; Chronic lower extremity pain (2ry area of Pain) (Left); Chronic lumbar radicular pain (L4 & S1 Dermatome) (Left); Chronic shoulder pain (Right side); Chronic upper extremity pain (Right-sided); Cervical radiculitis (Right side); Abnormal x-ray of lumbar spine; Chronic pain syndrome (significant psychosocial component); 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; 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); Lumbar facet syndrome (Bilateral) (L>R); Chronic sacroiliac joint pain (Bilateral); Lumbar facet hypertrophy  (multilevel); Lumbar spinal stenosis (L4-5); 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); 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); Lumbosacral radiculopathy at L4; Chronic shoulder pain (Left); Arthralgia of acromioclavicular joint (Right); Acromioclavicular joint DJD (Right); Osteoarthritis of shoulder (Right); Chronic hip pain (3ry area of Pain) (Bilateral) (L>R); Chronic lower extremity pain (Bilateral) (L>R); Cervicalgia (Bilateral) (R>L); Spondylosis without myelopathy or radiculopathy, lumbosacral region; Chronic knee pain (Left); Chronic lumbar radiculopathy/radiculitis (L2) (Left); Abnormal MRI, lumbar spine (07/23/2019); Protruded lumbar disc (L2-3) (Left); Numbness and tingling in right hand; Numbness in feet; Weakness of both lower extremities; Generalized muscle weakness; Left thigh pain; and Chronic shoulder pain (Bilateral) on their pertinent problem list. Pain Assessment: Severity of Chronic pain is reported as a 5 /10. Location: Back Left/Pain radiaties down to her buttock at times. Onset: More than a month ago. Quality: Aching, Radiating, Throbbing, Constant. Timing: Constant. Modifying factor(s): Meds. Vitals:  height is _0  (1.575 m) and weight is 177 lb (80.3 kg). Her temperature is 97.3 F (36.3 C) (abnormal). Her blood pressure is 123/78 and her pulse is 90. Her oxygen saturation is 97%.   Reason for encounter: medication management.   The patient indicates doing well with the current medication regimen. No adverse reactions or side effects reported to the medications.  The patient indicates beginning to experience more pain in the left lower back which is also giving her referred pain through the back of the leg down to the calf.  In the past we have been managing her lumbar facet syndrome with that therapeutic injections and in 2018 we had done a radiofrequency ablation.  We had to postpone doing any more of those because of her weight, but she has lost significant  weight and she is now again a candidate for radiofrequency ablation.  We will be doing this injection to see if this again provides her with good relief of  the pain and if it does, but she is not getting long-term benefit, we will consider repeating that radiofrequency ablation on the left side.  This plan was shared with the patient who understood and agreed.  UDS ordered today.   RTCB: 08/03/2021 Nonopioids transferred 08/03/2020: Gabapentin and baclofen  Pharmacotherapy Assessment  Analgesic: Hydrocodone/APAP 5/325 one tablet every 8 hours (15 mg/day of hydrocodone) MME/day: 15 mg/day.   Monitoring: [place] PMP: PDMP reviewed during this encounter.       Pharmacotherapy: No side-effects or adverse reactions reported. Compliance: No problems identified. Effectiveness: Clinically acceptable.  Chauncey Fischer, RN  05/05/2021 10:39 AM  Sign when Signing Visit Nursing Pain Medication Assessment:  Safety precautions to be maintained throughout the outpatient stay will include: orient to surroundings, keep bed in low position, maintain call bell within reach at all times, provide assistance with transfer out of bed and ambulation.  Medication Inspection Compliance: Pill count conducted under aseptic conditions, in front of the patient. Neither the pills nor the bottle was removed from the patient's sight at any time. Once count was completed pills were immediately returned to the patient in their original bottle.  Medication: Hydrocodone/APAP Pill/Patch Count:  5 of 90 pills remain Pill/Patch Appearance: Markings consistent with prescribed medication Bottle Appearance: Standard pharmacy container. Clearly labeled. Filled Date: 6 / 20 / 2022 Last Medication intake:  Today Safety precautions to be maintained throughout the outpatient stay will include: orient to surroundings, keep bed in low position, maintain call bell within reach at all times, provide assistance with transfer out of bed and  ambulation.      UDS:  Summary  Date Value Ref Range Status  04/14/2020 Note  Final    Comment:    ==================================================================== [employer] Select 13 (MW) ==================================================================== Test                             Result       Flag       Units  Drug Present and Declared for Prescription Verification   Hydrocodone                    2142         EXPECTED   ng/mg creat   Hydromorphone                  263          EXPECTED   ng/mg creat   Dihydrocodeine                 156          EXPECTED   ng/mg creat   Norhydrocodone                 2151         EXPECTED   ng/mg creat    Sources of hydrocodone include scheduled prescription medications.    Hydromorphone, dihydrocodeine and norhydrocodone are expected    metabolites of hydrocodone. Hydromorphone and dihydrocodeine are    also available as scheduled prescription medications.  ==================================================================== Test                      Result    Flag   Units      Ref Range   Creatinine              43  mg/dL      >=20 ==================================================================== Declared Medications:  The flagging and interpretation on this report are based on the  following declared medications.  Unexpected results may arise from  inaccuracies in the declared medications.   **Note: The testing scope of this panel includes these medications:   Hydrocodone (Norco)   **Note: The testing scope of this panel does not include the  following reported medications:   Acetaminophen (Norco)  Albuterol (Ventolin HFA)  Albuterol (Duoneb)  Aspirin  Baclofen (Lioresal)  Cholecalciferol  Digoxin (Lanoxin)  Fluticasone  Gabapentin (Neurontin)  Ipratropium (Duoneb)  Levothyroxine (Synthroid)  Potassium (Klor-Con)  Sertraline (Zoloft)  Torsemide (Demadex)  Umeclidinium   Vilanterol ==================================================================== For clinical consultation, please call [phone]. ====================================================================      ROS  Constitutional: Denies any fever or chills Gastrointestinal: No reported hemesis, hematochezia, vomiting, or acute GI distress Musculoskeletal: Denies any acute onset joint swelling, redness, loss of ROM, or weakness Neurological: No reported episodes of acute onset apraxia, aphasia, dysarthria, agnosia, amnesia, paralysis, loss of coordination, or loss of consciousness  Medication Review  Cholecalciferol, Fluticasone-Umeclidin-Vilant, HYDROcodone-acetaminophen, Oxygen-Helium, Roflumilast, albuterol, aspirin EC, baclofen, diazepam, digoxin, gabapentin, ipratropium-albuterol, levothyroxine, potassium chloride SA, sertraline, torsemide, and zaleplon  History Review  Allergy: Ms. Vanrossum is allergic to codeine and meloxicam. Drug: Ms. Dondlinger  reports no history of drug use. Alcohol:  reports no history of alcohol use. Tobacco:  reports that she has been smoking cigarettes. She has a 22.00 pack-year smoking history. She has never used smokeless tobacco. Social: Ms. Tolliver  reports that she has been smoking cigarettes. She has a 22.00 pack-year smoking history. She has never used smokeless tobacco. She reports that she does not drink alcohol and does not use drugs. Medical:  has a past medical history of Acute postoperative pain (10/03/2017), Anxiety, BiPAP (biphasic positive airway pressure) dependence, Carpal tunnel syndrome, CHF (congestive heart failure) ([place]), CHF (congestive heart failure) ([place]), Chronic generalized abdominal pain, Chronic rhinitis, COPD (chronic obstructive pulmonary disease) ([place]), DDD (degenerative disc disease), cervical, DDD (degenerative disc disease), lumbosacral, Depression, Dyspnea, Edema, Flu, Gastritis, GERD (gastroesophageal reflux disease), Hematuria,  Hernia of abdominal wall (07/17/2018), Hernia, abdominal, Hypertension, Kidney stones, Low back pain, Lumbar radiculitis, Malodorous urine, Muscle weakness, Obesity, Oxygen dependent, Renal cyst, Sensory urge incontinence, Thyroid activity decreased, Tobacco abuse, and Wheezing. Surgical: Ms. Salado  has a past surgical history that includes Abdominal hysterectomy; Tonsillectomy; Carpal tunnel release (Left, 2012); Tubal ligation; epigastric hernia repair (N/A, 08/13/2018); Umbilical hernia repair (N/A, 08/13/2018); and RIGHT/LEFT HEART CATH AND CORONARY ANGIOGRAPHY (N/A, 07/11/2019). Family: family history includes Alcohol abuse in her father; Breast cancer in her sister; Cancer in her brother, brother, and sister; Coronary artery disease in her mother; Heart disease in her father and mother; Lung cancer in her sister; Pneumonia in her brother; Stroke in her mother.  Laboratory Chemistry Profile   Renal Lab Results  Component Value Date   BUN 14 02/04/2021   CREATININE 0.80 02/04/2021   BCR 16 03/04/2020   GFRAA >60 06/26/2020   GFRNONAA >60 02/04/2021    Hepatic Lab Results  Component Value Date   AST 21 06/25/2020   ALT 22 06/25/2020   ALBUMIN 3.9 06/25/2020   ALKPHOS 94 06/25/2020   LIPASE 31 10/17/2017   AMMONIA 40 (H) 10/11/2019    Electrolytes Lab Results  Component Value Date   NA 136 02/04/2021   K 3.8 02/04/2021   CL 100 02/04/2021   CALCIUM 9.1 02/04/2021   MG 2.7 (H) 02/10/2020  PHOS 4.0 02/10/2020    Bone Lab Results  Component Value Date   25OHVITD1 13 (L) 09/17/2015   25OHVITD2 <1.0 09/17/2015   25OHVITD3 13 09/17/2015    Inflammation (CRP: Acute Phase) (ESR: Chronic Phase) Lab Results  Component Value Date   CRP 4.2 (H) 02/08/2020   ESRSEDRATE 16 08/08/2017   LATICACIDVEN 1.4 02/03/2020         Note: Above Lab results reviewed.  Recent Imaging Review  DG PAIN CLINIC C-ARM 1-60 MIN NO REPORT Fluoro was used, but no Radiologist interpretation will  be provided.  Please refer to "NOTES" tab for provider progress note. Note: Reviewed        Physical Exam  General appearance: Well nourished, well developed, and well hydrated. In no apparent acute distress Mental status: Alert, oriented x 3 (person, place, & time)       Respiratory: No evidence of acute respiratory distress Eyes: PERLA Vitals: BP 123/78   Pulse 90   Temp (!) 97.3 F (36.3 C)   Ht _0  (1.575 m)   Wt 177 lb (80.3 kg)   SpO2 97% Comment: 2.5 liters  BMI 32.37 kg/m  BMI: Estimated body mass index is 32.37 kg/m as calculated from the following:   Height as of this encounter: _1  (1.575 m).   Weight as of this encounter: 177 lb (80.3 kg). Ideal: Ideal body weight: 50.1 kg (110 lb 7.2 oz) Adjusted ideal body weight: 62.2 kg (137 lb 1.1 oz)  Assessment   Status Diagnosis  Controlled Worsening Worsening 1. Chronic pain syndrome (significant psychosocial component)   2. Chronic low back pain (1ry area of Pain) (Bilateral) (L>R) w/o sciatica   3. Lumbar facet syndrome (Bilateral) (L>R)   4. Lumbar facet hypertrophy (multilevel)   5. Spondylosis without myelopathy or radiculopathy, lumbosacral region   6. Chronic lower extremity pain (2ry area of Pain) (Left)   7. Chronic hip pain (3ry area of Pain) (Bilateral) (L>R)   8. Chronic knee pain (Left)   9. Chronic upper extremity pain (Right-sided)   10. Chronic shoulder pain (Bilateral)   11. Pharmacologic therapy   12. Chronic use of opiate for therapeutic purpose   13. Encounter for medication management   14. Anxiety due to invasive procedure      Updated Problems: Problem  Chronic shoulder pain (Bilateral)  Chronic hip pain (3ry area of Pain) (Bilateral) (L>R)    Plan of Care  Problem-specific:  No problem-specific Assessment & Plan notes found for this encounter.  Ms. Asianae Minkler has a current medication list which includes the following long-term medication(s): albuterol, digoxin,  hydrocodone-acetaminophen, [START ON 06/04/2021] hydrocodone-acetaminophen, [START ON 07/04/2021] hydrocodone-acetaminophen, ipratropium-albuterol, levothyroxine, potassium chloride sa, sertraline, torsemide, zaleplon, baclofen, and gabapentin.  Pharmacotherapy (Medications Ordered): Meds ordered this encounter  Medications   HYDROcodone-acetaminophen (NORCO/VICODIN) 5-325 MG tablet    Sig: Take 1 tablet by mouth every 8 (eight) hours as needed for severe pain. Must last 30 days    Dispense:  90 tablet    Refill:  0    Not a duplicate. Do NOT delete! Dispense 1 day early if closed on refill date. Avoid benzodiazepines within 8 hours of opioids. Do not send refill requests.   HYDROcodone-acetaminophen (NORCO/VICODIN) 5-325 MG tablet    Sig: Take 1 tablet by mouth every 8 (eight) hours as needed for severe pain. Must last 30 days    Dispense:  90 tablet    Refill:  0    Not a duplicate. Do NOT  delete! Dispense 1 day early if closed on refill date. Avoid benzodiazepines within 8 hours of opioids. Do not send refill requests.   HYDROcodone-acetaminophen (NORCO/VICODIN) 5-325 MG tablet    Sig: Take 1 tablet by mouth every 8 (eight) hours as needed for severe pain. Must last 30 days    Dispense:  90 tablet    Refill:  0    Not a duplicate. Do NOT delete! Dispense 1 day early if closed on refill date. Avoid benzodiazepines within 8 hours of opioids. Do not send refill requests.   diazepam (VALIUM) 10 MG tablet    Sig: Take 1 tablet (10 mg total) by mouth 60 (sixty) minutes before procedure for 1 dose. Take with a sip of water, on an empty stomach. Do not eat anything for 6 hours prior to procedure.    Dispense:  1 tablet    Refill:  0    Orders:  Orders Placed This Encounter  Procedures   LUMBAR FACET(MEDIAL BRANCH NERVE BLOCK) MBNB    Standing Status:   Future    Standing Expiration Date:   06/05/2021    Scheduling Instructions:     Procedure: Lumbar facet block (AKA.: Lumbosacral medial  branch nerve block)     Side: Left-sided     Level: L3-4, L4-5, & L5-S1 Facets (L2, L3, L4, L5, & S1 Medial Branch Nerves)     Sedation: No Sedation.     Timeframe: ASAA    Order Specific Question:   Where will this procedure be performed?    Answer:   [facility]   Tox[employer] Select 13 (MW), Urine    Volume: 30 ml(s). Minimum 3 ml of urine is needed. Document temperature of fresh sample. Indications: Long term (current) use of opiate analgesic [phone])    Order Specific Question:   Release to patient    Answer:   Immediate   Informed Consent Details: Physician/Practitioner Attestation; Transcribe to consent form and obtain patient signature    Nursing Order: Transcribe to consent form and obtain patient signature. Note: Always confirm laterality of pain with Ms. Alwyn Ren, before procedure.    Order Specific Question:   Physician/Practitioner attestation of informed consent for procedure/surgical case    Answer:   I, the physician/practitioner, attest that I have discussed with the patient the benefits, risks, side effects, alternatives, likelihood of achieving goals and potential problems during recovery for the procedure that I have provided informed consent.    Order Specific Question:   Procedure    Answer:   Lumbar Facet Block  under fluoroscopic guidance    Order Specific Question:   Physician/Practitioner performing the procedure    Answer:   Uliana Brinker A. 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    Follow-up plan:   Return for Procedure (no sedation): (L) L-FCT BLK.     Interventional Therapies  Risk  Complexity Considerations:   Estimated body mass index is 33.11 kg/m as calculated from the following:   Height as of this encounter: 5' 2" (1.575 m).   Weight as of  this encounter: 181 lb (82.1 kg). NOTE: NO further Lumbar RFA until BMI <35.   Planned  Pending:   Palliative bilateral lumbar facet block #4 (02/12/2019)    Under consideration:   Palliative procedures  Completed:   Palliative bilateral lumbar facet block x3  Therapeutic left L2-3 LESI #1 (09/10/2019)  Diagnostic/palliative left L4 TFESI x1  Diagnostic/palliative left L4-5 LESI x2  Diagnostic/palliative right L4 TFESI x1  Diagnostic/palliative right L4-5 LESI x1  Palliative left lumbar facet RFA x1 (06/27/2017)  Palliative right lumbar facet RFA x1 (10/03/2017)    Therapeutic  Palliative (PRN) options:   Palliative bilateral lumbar facet block #4  Diagnostic/palliative left L4 TFESI #2  Diagnostic/palliative left L4-5 LESI #3  Diagnostic/palliative right L4 TFESI #2  Diagnostic/palliative right L4-5 LESI #2  Palliative left lumbar facet RFA #2  Palliative right lumbar facet RFA #2       Recent Visits Date Type Provider Dept  02/25/21 Office Visit Milinda Pointer, MD [facility]  02/11/21 Procedure visit Milinda Pointer, MD [facility]  Showing recent visits within past 90 days and meeting all other requirements Today's Visits Date Type Provider Dept  05/05/21 Office Visit Milinda Pointer, MD [facility]  Showing today's visits and meeting all other requirements Future Appointments No visits were found meeting these conditions. Showing future appointments within next 90 days and meeting all other requirements I discussed the assessment and treatment plan with the patient. The patient was provided an opportunity to ask questions and all were answered. The patient agreed with the plan and demonstrated an understanding of the instructions.  Patient advised to call back or seek an in-person evaluation if the symptoms or condition worsens.  Duration of encounter: 30 minutes.  Note by: Gaspar Cola, MD Date: 05/05/2021; Time:  11:32 AM

## 2021-05-08 DIAGNOSIS — J449 Chronic obstructive pulmonary disease, unspecified: Secondary | ICD-10-CM | POA: Diagnosis not present

## 2021-05-10 LAB — TOXASSURE SELECT 13 (MW), URINE

## 2021-05-12 ENCOUNTER — Other Ambulatory Visit: Payer: Self-pay | Admitting: Family Medicine

## 2021-05-12 DIAGNOSIS — F411 Generalized anxiety disorder: Secondary | ICD-10-CM

## 2021-05-12 NOTE — Telephone Encounter (Signed)
Requested medication (s) are due for refill today: yes   Requested medication (s) are on the active medication list: yes   Last refill:  04/07/2021  Future visit scheduled:no  Notes to clinic:  message has been sent for patient to contact office  Overdue for follow up    Requested Prescriptions  Pending Prescriptions Disp Refills   sertraline (ZOLOFT) 100 MG tablet [Pharmacy Med Name: SERTRALINE 100MG TABLETS] 180 tablet 1    Sig: TAKE 2 [place]      Psychiatry:  Antidepressants - SSRI Failed - 05/12/2021  7:14 AM      Failed - Valid encounter within last 6 months    Recent Outpatient Visits           7 months ago COPD exacerbation [facility])   [place], Vickki Muff, PA-C   10 months ago Need for influenza vaccination   [place], PA-C   10 months ago Chronic obstructive pulmonary disease with acute exacerbation ([place])   [place], Wendee Beavers, PA-C   1 year ago Essential (primary) hypertension   [employer], Vickki Muff, PA-C   1 year ago Weakness   [employer], Vickki Muff, PA-C       Future Appointments             In 1 week Amalia Hailey, Nyoka Lint, MD [facility] - Completed PHQ-2 or PHQ-9 in the last 360 days        digoxin (LANOXIN) 0.25 MG tablet [Pharmacy Med Name: DIGOXIN 0.25MG TABLETS (WHITE)] 90 tablet 0    Sig: TAKE 1 TABLET(0.25 MG) BY MOUTH DAILY      Cardiovascular:  Antiarrhythmic Agents - digoxin Failed - 05/12/2021  7:14 AM      Failed - Digoxin (serum) in normal range and within 180 days    Digoxin Level  Date Value Ref Range Status  10/11/2019 1.5 0.8 - 2.0 ng/mL Final    Comment:    Performed at [facility], [place] [address] [place], [place] [address]          Failed - Valid encounter within last 6 months    Recent Outpatient Visits           7 months ago COPD  exacerbation [facility])   [place], Vickki Muff, PA-C   10 months ago Need for influenza vaccination   [place], PA-C   10 months ago Chronic obstructive pulmonary disease with acute exacerbation [facility])   [facility], Wendee Beavers, PA-C   1 year ago Essential (primary) hypertension   [employer], Vickki Muff, PA-C   1 year ago Weakness   [employer], Vickki Muff, PA-C       Future Appointments             In 1 week Harlin Heys, MD [facility] in normal range and within 180 days    Creatinine  Date Value Ref Range Status  02/02/2015 0.70 mg/dL Final    Comment:    0.44-1.00 NOTE: New Reference Range  12/23/14    Creatinine, Ser  Date Value Ref Range Status  02/04/2021 0.80 0.44 - 1.00 mg/dL Final  Passed - Ca in normal range and within 180 days    Calcium  Date Value Ref Range Status  02/04/2021 9.1 8.9 - 10.3 mg/dL Final   Calcium, Total  Date Value Ref Range Status  02/02/2015 8.7 (L) mg/dL Final    Comment:    8.9-10.3 NOTE: New Reference Range  12/23/14    Calcium, Ion  Date Value Ref Range Status  10/11/2019 1.16 1.15 - 1.40 mmol/L Final          Passed - K in normal range and within 180 days    Potassium  Date Value Ref Range Status  02/04/2021 3.8 3.5 - 5.1 mmol/L Final  02/02/2015 3.9 mmol/L Final    Comment:    3.5-5.1 NOTE: New Reference Range  12/23/14           Passed - Last Heart Rate in normal range    Pulse Readings from Last 1 Encounters:  05/05/21 90

## 2021-05-13 ENCOUNTER — Other Ambulatory Visit: Payer: Self-pay

## 2021-05-13 ENCOUNTER — Ambulatory Visit
Admission: RE | Admit: 2021-05-13 | Discharge: 2021-05-13 | Disposition: A | Discharge status: Home / Self Care | Payer: Medicare HMO | Source: Ambulatory Visit | Attending: Pain Medicine | Admitting: Pain Medicine

## 2021-05-13 ENCOUNTER — Encounter: Payer: Self-pay | Admitting: Pain Medicine

## 2021-05-13 ENCOUNTER — Ambulatory Visit (HOSPITAL_BASED_OUTPATIENT_CLINIC_OR_DEPARTMENT_OTHER): Payer: Medicare HMO | Admitting: Pain Medicine

## 2021-05-13 VITALS — BP 136/74 | HR 98 | Temp 96.4°F | Resp 23 | Ht 62.0 in | Wt 177.0 lb

## 2021-05-13 DIAGNOSIS — G8929 Other chronic pain: Secondary | ICD-10-CM

## 2021-05-13 DIAGNOSIS — M47817 Spondylosis without myelopathy or radiculopathy, lumbosacral region: Secondary | ICD-10-CM | POA: Diagnosis not present

## 2021-05-13 DIAGNOSIS — M545 Low back pain, unspecified: Secondary | ICD-10-CM

## 2021-05-13 DIAGNOSIS — M47816 Spondylosis without myelopathy or radiculopathy, lumbar region: Secondary | ICD-10-CM | POA: Insufficient documentation

## 2021-05-13 DIAGNOSIS — M5136 Other intervertebral disc degeneration, lumbar region: Secondary | ICD-10-CM

## 2021-05-13 MED ORDER — ROPIVACAINE HCL 2 MG/ML IJ SOLN
9.0000 mL | Freq: Once | INTRAMUSCULAR | Status: AC
  Administered 2021-05-13: 9 mL via PERINEURAL

## 2021-05-13 MED ORDER — TRIAMCINOLONE ACETONIDE 40 MG/ML IJ SUSP
INTRAMUSCULAR | Status: AC
  Filled 2021-05-13: qty 1

## 2021-05-13 MED ORDER — LIDOCAINE HCL (PF) 2 % IJ SOLN
INTRAMUSCULAR | Status: AC
  Filled 2021-05-13: qty 15

## 2021-05-13 MED ORDER — TRIAMCINOLONE ACETONIDE 40 MG/ML IJ SUSP
40.0000 mg | Freq: Once | INTRAMUSCULAR | Status: AC
  Administered 2021-05-13: 40 mg

## 2021-05-13 MED ORDER — LIDOCAINE HCL 2 % IJ SOLN
20.0000 mL | Freq: Once | INTRAMUSCULAR | Status: AC
  Administered 2021-05-13: 300 mg

## 2021-05-13 MED ORDER — ROPIVACAINE HCL 2 MG/ML IJ SOLN
INTRAMUSCULAR | Status: AC
  Filled 2021-05-13: qty 10

## 2021-05-13 MED ORDER — PENTAFLUOROPROP-TETRAFLUOROETH EX AERO
1.0000 "application " | INHALATION_SPRAY | Freq: Once | CUTANEOUS | Status: DC
  Filled 2021-05-13: qty 30

## 2021-05-13 NOTE — Progress Notes (Signed)
PROVIDER NOTE: Information contained herein reflects review and annotations entered in association with encounter. Interpretation of such information and data should be left to medically-trained personnel. Information provided to patient can be located elsewhere in the medical record under "Patient Instructions". Document created using STT-dictation technology, any transcriptional errors that may result from process are unintentional.    Patient: [name]  Service Category: Procedure  Provider: Gaspar Cola, MD  DOB: 1955-11-09  DOS: 05/13/2021  Location: [facility]  MRN: [id number]  Setting: Ambulatory - outpatient  Referring Provider: Margo Common, PA-C  Type: Established Patient  Specialty: Interventional Pain Management  PCP: Margo Common, PA-C   Primary Reason for Visit: Interventional Pain Management Treatment. CC: Back Pain (Low left)  Procedure:          Anesthesia, Analgesia, Anxiolysis:  Type: Lumbar Facet, Medial Branch Block(s)          Primary Purpose: Diagnostic Region: Posterolateral Lumbosacral Spine Level: L2, L3, L4, L5, & S1 Medial Branch Level(s). Injecting these levels blocks the L3-4, L4-5, and L5-S1 lumbar facet joints. Laterality: Left  Type: Local Anesthesia Indication(s): Analgesia         Route: Infiltration ([place]/IM) IV Access: Declined Sedation: Declined  Local Anesthetic: Lidocaine 1-2%  Position: Prone   Indications: 1. Lumbar facet syndrome (Bilateral) (L>R)   2. Lumbar facet hypertrophy (multilevel)   3. Spondylosis without myelopathy or radiculopathy, lumbosacral region   4. DDD (degenerative disc disease), lumbar   5. Chronic low back pain (1ry area of Pain) (Bilateral) (L>R) w/o sciatica    Pain Score: Pre-procedure: 5 /10 Post-procedure: 0-No pain/10   Pre-op H&P Assessment:  Ms. Ellwood is a 65 y.o. (year old), female patient, seen today for interventional treatment. She  has a past surgical history that  includes Abdominal hysterectomy; Tonsillectomy; Carpal tunnel release (Left, 2012); Tubal ligation; epigastric hernia repair (N/A, 08/13/2018); Umbilical hernia repair (N/A, 08/13/2018); and RIGHT/LEFT HEART CATH AND CORONARY ANGIOGRAPHY (N/A, 07/11/2019). Ms. Brinkmeier has a current medication list which includes the following prescription(s): albuterol, aspirin ec, baclofen, cholecalciferol, diazepam, digoxin, fluticasone-umeclidin-vilant, gabapentin, hydrocodone-acetaminophen, [START ON 06/04/2021] hydrocodone-acetaminophen, [START ON 07/04/2021] hydrocodone-acetaminophen, ipratropium-albuterol, levothyroxine, oxygen-helium, potassium chloride sa, daliresp, sertraline, torsemide, and zaleplon, and the following Facility-Administered Medications: pentafluoroprop-tetrafluoroeth. Her primarily concern today is the Back Pain (Low left)  Initial Vital Signs:  Pulse/HCG Rate: 98ECG Heart Rate: 91 Temp: (!) 96.4 F (35.8 C) Resp: 16 BP: 100/73 SpO2: 92 %  BMI: Estimated body mass index is 32.37 kg/m as calculated from the following:   Height as of this encounter: _0  (1.575 m).   Weight as of this encounter: 177 lb (80.3 kg).  Risk Assessment: Allergies: Reviewed. She is allergic to codeine and meloxicam.  Allergy Precautions: None required Coagulopathies: Reviewed. None identified.  Blood-thinner therapy: None at this time Active Infection(s): Reviewed. None identified. Ms. Chivers is afebrile  Site Confirmation: Ms. Fanguy was asked to confirm the procedure and laterality before marking the site Procedure checklist: Completed Consent: Before the procedure and under the influence of no sedative(s), amnesic(s), or anxiolytics, the patient was informed of the treatment options, risks and possible complications. To fulfill our ethical and legal obligations, as recommended by the American Medical Association's Code of Ethics, I have informed the patient of my clinical impression; the nature and  purpose of the treatment or procedure; the risks, benefits, and possible complications of the intervention; the alternatives, including doing nothing; the risk(s) and benefit(s) of the alternative treatment(s) or procedure(s);  and the risk(s) and benefit(s) of doing nothing. The patient was provided information about the general risks and possible complications associated with the procedure. These may include, but are not limited to: failure to achieve desired goals, infection, bleeding, organ or nerve damage, allergic reactions, paralysis, and death. In addition, the patient was informed of those risks and complications associated to Spine-related procedures, such as failure to decrease pain; infection (i.e.: Meningitis, epidural or intraspinal abscess); bleeding (i.e.: epidural hematoma, subarachnoid hemorrhage, or any other type of intraspinal or peri-dural bleeding); organ or nerve damage (i.e.: Any type of peripheral nerve, nerve root, or spinal cord injury) with subsequent damage to sensory, motor, and/or autonomic systems, resulting in permanent pain, numbness, and/or weakness of one or several areas of the body; allergic reactions; (i.e.: anaphylactic reaction); and/or death. Furthermore, the patient was informed of those risks and complications associated with the medications. These include, but are not limited to: allergic reactions (i.e.: anaphylactic or anaphylactoid reaction(s)); adrenal axis suppression; blood sugar elevation that in diabetics may result in ketoacidosis or comma; water retention that in patients with history of congestive heart failure may result in shortness of breath, pulmonary edema, and decompensation with resultant heart failure; weight gain; swelling or edema; medication-induced neural toxicity; particulate matter embolism and blood vessel occlusion with resultant organ, and/or nervous system infarction; and/or aseptic necrosis of one or more joints. Finally, the patient was  informed that Medicine is not an exact science; therefore, there is also the possibility of unforeseen or unpredictable risks and/or possible complications that may result in a catastrophic outcome. The patient indicated having understood very clearly. We have given the patient no guarantees and we have made no promises. Enough time was given to the patient to ask questions, all of which were answered to the patient's satisfaction. Ms. Haye has indicated that she wanted to continue with the procedure. Attestation: I, the ordering provider, attest that I have discussed with the patient the benefits, risks, side-effects, alternatives, likelihood of achieving goals, and potential problems during recovery for the procedure that I have provided informed consent. Date  Time: 05/13/2021  9:26 AM  Pre-Procedure Preparation:  Monitoring: As per clinic protocol. Respiration, ETCO2, SpO2, BP, heart rate and rhythm monitor placed and checked for adequate function Safety Precautions: Patient was assessed for positional comfort and pressure points before starting the procedure. Time-out: I initiated and conducted the "Time-out" before starting the procedure, as per protocol. The patient was asked to participate by confirming the accuracy of the "Time Out" information. Verification of the correct person, site, and procedure were performed and confirmed by me, the nursing staff, and the patient. "Time-out" conducted as per Joint Commission's Universal Protocol (UP.01.01.01). Time: 1012  Description of Procedure:          Laterality: Left Levels:  L2, L3, L4, L5, & S1 Medial Branch Level(s) Area Prepped: Posterior Lumbosacral Region DuraPrep (Iodine Povacrylex [0.7% available iodine] and Isopropyl Alcohol, 74% w/w) Safety Precautions: Aspiration looking for blood return was conducted prior to all injections. At no point did we inject any substances, as a needle was being advanced. Before injecting, the patient was  told to immediately notify me if she was experiencing any new onset of "ringing in the ears, or metallic taste in the mouth". No attempts were made at seeking any paresthesias. Safe injection practices and needle disposal techniques used. Medications properly checked for expiration dates. SDV (single dose vial) medications used. After the completion of the procedure, all disposable equipment used was  discarded in the proper designated medical waste containers. Local Anesthesia: Protocol guidelines were followed. The patient was positioned over the fluoroscopy table. The area was prepped in the usual manner. The time-out was completed. The target area was identified using fluoroscopy. A 12-in long, straight, sterile hemostat was used with fluoroscopic guidance to locate the targets for each level blocked. Once located, the skin was marked with an approved surgical skin marker. Once all sites were marked, the skin (epidermis, dermis, and hypodermis), as well as deeper tissues (fat, connective tissue and muscle) were infiltrated with a small amount of a short-acting local anesthetic, loaded on a 10cc syringe with a 25G, 1.5-in  Needle. An appropriate amount of time was allowed for local anesthetics to take effect before proceeding to the next step. Local Anesthetic: Lidocaine 2.0% The unused portion of the local anesthetic was discarded in the proper designated containers. Technical explanation of process:  L2 Medial Branch Nerve Block (MBB): The target area for the L2 medial branch is at the junction of the postero-lateral aspect of the superior articular process and the superior, posterior, and medial edge of the transverse process of L3. Under fluoroscopic guidance, a Quincke needle was inserted until contact was made with os over the superior postero-lateral aspect of the pedicular shadow (target area). After negative aspiration for blood, 0.5 mL of the nerve block solution was injected without difficulty or  complication. The needle was removed intact. L3 Medial Branch Nerve Block (MBB): The target area for the L3 medial branch is at the junction of the postero-lateral aspect of the superior articular process and the superior, posterior, and medial edge of the transverse process of L4. Under fluoroscopic guidance, a Quincke needle was inserted until contact was made with os over the superior postero-lateral aspect of the pedicular shadow (target area). After negative aspiration for blood, 0.5 mL of the nerve block solution was injected without difficulty or complication. The needle was removed intact. L4 Medial Branch Nerve Block (MBB): The target area for the L4 medial branch is at the junction of the postero-lateral aspect of the superior articular process and the superior, posterior, and medial edge of the transverse process of L5. Under fluoroscopic guidance, a Quincke needle was inserted until contact was made with os over the superior postero-lateral aspect of the pedicular shadow (target area). After negative aspiration for blood, 0.5 mL of the nerve block solution was injected without difficulty or complication. The needle was removed intact. L5 Medial Branch Nerve Block (MBB): The target area for the L5 medial branch is at the junction of the postero-lateral aspect of the superior articular process and the superior, posterior, and medial edge of the sacral ala. Under fluoroscopic guidance, a Quincke needle was inserted until contact was made with os over the superior postero-lateral aspect of the pedicular shadow (target area). After negative aspiration for blood, 0.5 mL of the nerve block solution was injected without difficulty or complication. The needle was removed intact. S1 Medial Branch Nerve Block (MBB): The target area for the S1 medial branch is at the posterior and inferior 6 o'clock position of the L5-S1 facet joint. Under fluoroscopic guidance, the Quincke needle inserted for the L5 MBB was  redirected until contact was made with os over the inferior and postero aspect of the sacrum, at the 6 o' clock position under the L5-S1 facet joint (Target area). After negative aspiration for blood, 0.5 mL of the nerve block solution was injected without difficulty or complication. The needle was removed  intact.  Nerve block solution: 0.2% PF-Ropivacaine + Triamcinolone (40 mg/mL) diluted to a final concentration of 4 mg of Triamcinolone/mL of Ropivacaine The unused portion of the solution was discarded in the proper designated containers. Procedural Needles: 22-gauge, 3.5-inch, Quincke needles used for all levels.  Once the entire procedure was completed, the treated area was cleaned, making sure to leave some of the prepping solution back to take advantage of its long term bactericidal properties.      Illustration of the posterior view of the lumbar spine and the posterior neural structures. Laminae of L2 through S1 are labeled. DPRL5, dorsal primary ramus of L5; DPRS1, dorsal primary ramus of S1; DPR3, dorsal primary ramus of L3; FJ, facet (zygapophyseal) joint L3-L4; I, inferior articular process of L4; LB1, lateral branch of dorsal primary ramus of L1; IAB, inferior articular branches from L3 medial branch (supplies L4-L5 facet joint); IBP, intermediate branch plexus; MB3, medial branch of dorsal primary ramus of L3; NR3, third lumbar nerve root; S, superior articular process of L5; SAB, superior articular branches from L4 (supplies L4-5 facet joint also); TP3, transverse process of L3.  Vitals:   05/13/21 0924 05/13/21 1011 05/13/21 1015 05/13/21 1018  BP: 100/73 136/75 138/65 136/74  Pulse: 98     Resp: 16 (!) 25 (!) 23 (!) 23  Temp: (!) 96.4 F (35.8 C)     TempSrc: Temporal     SpO2: 92% 97% 97% 96%  Weight: 177 lb (80.3 kg)     Height: 5' 2" (1.575 m)        Start Time: 1012 hrs. End Time: 1017 hrs.  Imaging Guidance (Spinal):          Type of Imaging Technique: Fluoroscopy  Guidance (Spinal) Indication(s): Assistance in needle guidance and placement for procedures requiring needle placement in or near specific anatomical locations not easily accessible without such assistance. Exposure Time: Please see nurses notes. Contrast: None used. Fluoroscopic Guidance: I was personally present during the use of fluoroscopy. "Tunnel Vision Technique" used to obtain the best possible view of the target area. Parallax error corrected before commencing the procedure. "Direction-depth-direction" technique used to introduce the needle under continuous pulsed fluoroscopy. Once target was reached, antero-posterior, oblique, and lateral fluoroscopic projection used confirm needle placement in all planes. Images permanently stored in EMR. Interpretation: No contrast injected. I personally interpreted the imaging intraoperatively. Adequate needle placement confirmed in multiple planes. Permanent images saved into the patient's record.  Antibiotic Prophylaxis:   Anti-infectives (From admission, onward)    None      Indication(s): None identified  Post-operative Assessment:  Post-procedure Vital Signs:  Pulse/HCG Rate: 9887 Temp: (!) 96.4 F (35.8 C) Resp: (!) 23 BP: 136/74 SpO2: 96 %  EBL: None  Complications: No immediate post-treatment complications observed by team, or reported by patient.  Note: The patient tolerated the entire procedure well. A repeat set of vitals were taken after the procedure and the patient was kept under observation following institutional policy, for this type of procedure. Post-procedural neurological assessment was performed, showing return to baseline, prior to discharge. The patient was provided with post-procedure discharge instructions, including a section on how to identify potential problems. Should any problems arise concerning this procedure, the patient was given instructions to immediately contact us, at any time, without hesitation. In  any case, we plan to contact the patient by telephone for a follow-up status report regarding this interventional procedure.  Comments:  No additional relevant information.  Plan of Care  Orders:  Orders Placed This Encounter  Procedures   LUMBAR FACET(MEDIAL BRANCH NERVE BLOCK) MBNB    Scheduling Instructions:     Procedure: Lumbar facet block (AKA.: Lumbosacral medial branch nerve block)     Side: Left-sided     Level: L3-4, L4-5, & L5-S1 Facets (L2, L3, L4, L5, & S1 Medial Branch Nerves)     Sedation: Patient's choice.     Timeframe: Today    Order Specific Question:   Where will this procedure be performed?    Answer:   [facility]   DG [facility] C-ARM 1-60 MIN NO REPORT    Intraoperative interpretation by procedural physician at [facility].    Standing Status:   Standing    Number of Occurrences:   1    Order Specific Question:   Reason for exam:    Answer:   Assistance in needle guidance and placement for procedures requiring needle placement in or near specific anatomical locations not easily accessible without such assistance.   Informed Consent Details: Physician/Practitioner Attestation; Transcribe to consent form and obtain patient signature    Nursing Order: Transcribe to consent form and obtain patient signature. Note: Always confirm laterality of pain with Ms. Alwyn Ren, before procedure.    Order Specific Question:   Physician/Practitioner attestation of informed consent for procedure/surgical case    Answer:   I, the physician/practitioner, attest that I have discussed with the patient the benefits, risks, side effects, alternatives, likelihood of achieving goals and potential problems during recovery for the procedure that I have provided informed consent.    Order Specific Question:   Procedure    Answer:   Lumbar Facet Block  under fluoroscopic guidance    Order Specific Question:   Physician/Practitioner performing the procedure    Answer:    Morayo Leven A. 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   Provide equipment / supplies at bedside    "Block Tray" (Disposable  single use) Needle type: SpinalSpinal Amount/quantity: 4 Size: Regular (3.5-inch) Gauge: 22G    Standing Status:   Standing    Number of Occurrences:   1    Order Specific Question:   Specify    Answer:   Block Tray    Chronic Opioid Analgesic:  Hydrocodone/APAP 5/325 one tablet every 8 hours (15 mg/day of hydrocodone) MME/day: 15 mg/day.   Medications ordered for procedure: Meds ordered this encounter  Medications   lidocaine (XYLOCAINE) 2 % (with pres) injection 400 mg   ropivacaine (PF) 2 mg/mL (0.2%) (NAROPIN) injection 9 mL   triamcinolone acetonide (KENALOG-40) injection 40 mg   pentafluoroprop-tetrafluoroeth (GEBAUERS) aerosol 1 application    Medications administered: We administered lidocaine, ropivacaine (PF) 2 mg/mL (0.2%), and triamcinolone acetonide.  See the medical record for exact dosing, route, and time of administration.  Follow-up plan:   Return in about 2 weeks (around 05/27/2021) for (VV) PM Proc-day (T,Th) (PPE).      Interventional Therapies  Risk  Complexity Considerations:   Estimated body mass index is 33.11 kg/m as calculated from the following:   Height as of this encounter: _0  (1.575 m).   Weight as of this encounter: 181 lb (82.1 kg). NOTE: NO further Lumbar RFA until BMI <35.   Planned  Pending:   Palliative bilateral  lumbar facet block #4 (02/12/2019)    Under consideration:   Palliative procedures    Completed:   Palliative bilateral lumbar facet block x3  Therapeutic left L2-3 LESI #1 (09/10/2019)  Diagnostic/palliative left L4 TFESI x1  Diagnostic/palliative left L4-5 LESI x2  Diagnostic/palliative  right L4 TFESI x1  Diagnostic/palliative right L4-5 LESI x1  Palliative left lumbar facet RFA x1 (06/27/2017)  Palliative right lumbar facet RFA x1 (10/03/2017)    Therapeutic  Palliative (PRN) options:   Palliative bilateral lumbar facet block #4  Diagnostic/palliative left L4 TFESI #2  Diagnostic/palliative left L4-5 LESI #3  Diagnostic/palliative right L4 TFESI #2  Diagnostic/palliative right L4-5 LESI #2  Palliative left lumbar facet RFA #2  Palliative right lumbar facet RFA #2        Recent Visits Date Type Provider Dept  05/05/21 Office Visit Milinda Pointer, MD [facility]  02/25/21 Office Visit Milinda Pointer, MD [facility]  Showing recent visits within past 90 days and meeting all other requirements Today's Visits Date Type Provider Dept  05/13/21 Procedure visit Milinda Pointer, MD [facility]  Showing today's visits and meeting all other requirements Future Appointments Date Type Provider Dept  05/27/21 Appointment Milinda Pointer, MD [facility]  07/28/21 Appointment Milinda Pointer, MD [facility]  Showing future appointments within next 90 days and meeting all other requirements Disposition: Discharge home  Discharge (Date  Time): 05/13/2021; 1022 hrs.   Primary Care Physician: Margo Common, PA-C Location: [facility] Note by: Gaspar Cola, MD Date: 05/13/2021; Time: 11:53 AM  Disclaimer:  Medicine is not an Chief Strategy Officer. The only guarantee in medicine is that nothing is guaranteed. It is important to note that the decision to proceed with this intervention was based on the information collected from the patient. The Data and conclusions were drawn from the patient's questionnaire, the interview, and the physical examination. Because the information was provided in large part by the patient, it cannot be guaranteed that it has not been purposely or  unconsciously manipulated. Every effort has been made to obtain as much relevant data as possible for this evaluation. It is important to note that the conclusions that lead to this procedure are derived in large part from the available data. Always take into account that the treatment will also be dependent on availability of resources and existing treatment guidelines, considered by other Pain Management Practitioners as being common knowledge and practice, at the time of the intervention. For Medico-Legal purposes, it is also important to point out that variation in procedural techniques and pharmacological choices are the acceptable norm. The indications, contraindications, technique, and results of the above procedure should only be interpreted and judged by a Board-Certified Interventional Pain Specialist with extensive familiarity and expertise in the same exact procedure and technique.

## 2021-05-14 ENCOUNTER — Telehealth: Payer: Self-pay

## 2021-05-14 NOTE — Telephone Encounter (Signed)
Post procedure follow up.  Patient states she is doing good.

## 2021-05-19 ENCOUNTER — Ambulatory Visit (INDEPENDENT_AMBULATORY_CARE_PROVIDER_SITE_OTHER): Payer: Medicare HMO | Admitting: Obstetrics and Gynecology

## 2021-05-19 ENCOUNTER — Other Ambulatory Visit: Payer: Self-pay

## 2021-05-19 ENCOUNTER — Encounter: Payer: Self-pay | Admitting: Obstetrics and Gynecology

## 2021-05-19 VITALS — BP 121/66 | HR 81 | Ht 62.0 in | Wt 180.0 lb

## 2021-05-19 DIAGNOSIS — K4090 Unilateral inguinal hernia, without obstruction or gangrene, not specified as recurrent: Secondary | ICD-10-CM

## 2021-05-19 DIAGNOSIS — N3941 Urge incontinence: Secondary | ICD-10-CM | POA: Diagnosis not present

## 2021-05-19 DIAGNOSIS — Z8742 Personal history of other diseases of the female genital tract: Secondary | ICD-10-CM | POA: Diagnosis not present

## 2021-05-19 MED ORDER — TOLTERODINE TARTRATE ER 2 MG PO CP24: 2.0000 mg | ORAL_CAPSULE | Freq: Every day | ORAL | 2 refills | Status: DC

## 2021-05-19 NOTE — Progress Notes (Signed)
HPI:      [name] is a 65 y.o. G2P2002 who LMP was No LMP recorded. Patient has had a hysterectomy.  Subjective:   She presents today after having not been seen in approximately 3 years.  Patient states that she got COVID and was hospitalized for 8 days.  She has now recovered. She has a history of chronic tobacco use COPD and is currently on oxygen. She complains today of occasional left-sided sharp lower abdominal pain. She also describes a history of urine loss.  This occurs suddenly and she empties a large amount of urine when it happens.  She says she cannot control it.  It is not associated with coughing laughing and sneezing. Of significant note patient had a previously abnormal Pap smear.  Her subsequent colposcopy was negative.    Hx: The following portions of the patient's history were reviewed and updated as appropriate:             She  has a past medical history of Acute postoperative pain (10/03/2017), Anxiety, BiPAP (biphasic positive airway pressure) dependence, Carpal tunnel syndrome, CHF (congestive heart failure) ([place]), CHF (congestive heart failure) ([place]), Chronic generalized abdominal pain, Chronic rhinitis, COPD (chronic obstructive pulmonary disease) ([place]), DDD (degenerative disc disease), cervical, DDD (degenerative disc disease), lumbosacral, Depression, Dyspnea, Edema, Flu, Gastritis, GERD (gastroesophageal reflux disease), Hematuria, Hernia of abdominal wall (07/17/2018), Hernia, abdominal, Hypertension, Kidney stones, Low back pain, Lumbar radiculitis, Malodorous urine, Muscle weakness, Obesity, Oxygen dependent, Renal cyst, Sensory urge incontinence, Thyroid activity decreased, Tobacco abuse, and Wheezing. She does not have any pertinent problems on file. She  has a past surgical history that includes Abdominal hysterectomy; Tonsillectomy; Carpal tunnel release (Left, 2012); Tubal ligation; epigastric hernia repair (N/A, 08/13/2018); Umbilical hernia repair  (N/A, 08/13/2018); and RIGHT/LEFT HEART CATH AND CORONARY ANGIOGRAPHY (N/A, 07/11/2019). Her family history includes Alcohol abuse in her father; Breast cancer in her sister; Cancer in her brother, brother, and sister; Coronary artery disease in her mother; Heart disease in her father and mother; Lung cancer in her sister; Pneumonia in her brother; Stroke in her mother. She  reports that she has been smoking cigarettes. She has a 22.00 pack-year smoking history. She has never used smokeless tobacco. She reports that she does not drink alcohol and does not use drugs. She has a current medication list which includes the following prescription(s): albuterol, aspirin ec, cholecalciferol, digoxin, fluticasone-umeclidin-vilant, hydrocodone-acetaminophen, [START ON 06/04/2021] hydrocodone-acetaminophen, ipratropium-albuterol, levothyroxine, oxygen-helium, potassium chloride sa, daliresp, sertraline, tolterodine, torsemide, baclofen, diazepam, gabapentin, [START ON 07/04/2021] hydrocodone-acetaminophen, and zaleplon. She is allergic to codeine and meloxicam.       Review of Systems:  Review of Systems  Constitutional: Denied constitutional symptoms, night sweats, recent illness, fatigue, fever, insomnia and weight loss.  Eyes: Denied eye symptoms, eye pain, photophobia, vision change and visual disturbance.  Ears/Nose/Throat/Neck: Denied ear, nose, throat or neck symptoms, hearing loss, nasal discharge, sinus congestion and sore throat.  Cardiovascular: Denied cardiovascular symptoms, arrhythmia, chest pain/pressure, edema, exercise intolerance, orthopnea and palpitations.  Respiratory: Denied pulmonary symptoms, asthma, pleuritic pain, productive sputum, cough, dyspnea and wheezing.  Gastrointestinal: Denied, gastro-esophageal reflux, melena, nausea and vomiting.  Genitourinary: See HPI for additional information.  Musculoskeletal: Denied musculoskeletal symptoms, stiffness, swelling, muscle weakness and  myalgia.  Dermatologic: Denied dermatology symptoms, rash and scar.  Neurologic: Denied neurology symptoms, dizziness, headache, neck pain and syncope.  Psychiatric: Denied psychiatric symptoms, anxiety and depression.  Endocrine: Denied endocrine symptoms including hot flashes and night sweats.   Meds:  Current Outpatient Medications on File Prior to Visit  Medication Sig Dispense Refill   albuterol (VENTOLIN HFA) 108 (90 Base) MCG/ACT inhaler Inhale 2 puffs into the lungs every 6 (six) hours as needed for wheezing or shortness of breath. 8 g 1   aspirin EC 81 MG tablet Take 81 mg by mouth daily.     Cholecalciferol 50 MCG (2000 UT) CAPS Take 2,000 Units by mouth daily.      digoxin (LANOXIN) 0.25 MG tablet TAKE 1 TABLET(0.25 MG) BY MOUTH DAILY 30 tablet 0   Fluticasone-Umeclidin-Vilant 100-62.5-25 MCG/INH AEPB Inhale 1 puff into the lungs daily.     HYDROcodone-acetaminophen (NORCO/VICODIN) 5-325 MG tablet Take 1 tablet by mouth every 8 (eight) hours as needed for severe pain. Must last 30 days 90 tablet 0   [START ON 06/04/2021] HYDROcodone-acetaminophen (NORCO/VICODIN) 5-325 MG tablet Take 1 tablet by mouth every 8 (eight) hours as needed for severe pain. Must last 30 days 90 tablet 0   ipratropium-albuterol (DUONEB) 0.5-2.5 (3) MG/3ML SOLN USE 1 VIAL VIA NEBULIZER EVERY 6 HOURS AS NEEDED FOR SHORTNESS OF BREATH OR WHEEZING 1080 mL 0   levothyroxine (SYNTHROID) 25 MCG tablet TAKE 1 TABLET(25 MCG) BY MOUTH DAILY BEFORE BREAKFAST 90 tablet 1   OXYGEN Inhale 2.5 L/min into the lungs.     potassium chloride SA (KLOR-CON) 20 MEQ tablet TAKE 1 TABLET(20 MEQ) BY MOUTH TWICE DAILY 180 tablet 3   Roflumilast (DALIRESP) 250 MCG TABS Take 500 mcg by mouth daily as needed.     sertraline (ZOLOFT) 100 MG tablet TAKE 2 TABLETS BY MOUTH EVERY MORNING 60 tablet 0   torsemide (DEMADEX) 20 MG tablet Take 2 tablets (40 mg total) by mouth 2 (two) times daily. 360 tablet 3   baclofen (LIORESAL) 10 MG tablet  Take 1 tablet (10 mg total) by mouth 3 (three) times daily. 90 tablet 5   diazepam (VALIUM) 10 MG tablet Take 1 tablet (10 mg total) by mouth 60 (sixty) minutes before procedure for 1 dose. Take with a sip of water, on an empty stomach. Do not eat anything for 6 hours prior to procedure. (Patient not taking: Reported on 05/19/2021) 1 tablet 0   gabapentin (NEURONTIN) 400 MG capsule Take 1 capsule (400 mg total) by mouth 3 (three) times daily. 90 capsule 5   [START ON 07/04/2021] HYDROcodone-acetaminophen (NORCO/VICODIN) 5-325 MG tablet Take 1 tablet by mouth every 8 (eight) hours as needed for severe pain. Must last 30 days 90 tablet 0   zaleplon (SONATA) 5 MG capsule Take 5 mg by mouth at bedtime as needed for sleep. (Patient not taking: Reported on 05/19/2021)     No current facility-administered medications on file prior to visit.      Objective:     Vitals:   05/19/21 1102  BP: 121/66  Pulse: 81   Filed Weights   05/19/21 1102  Weight: 180 lb (81.6 kg)              Abdominal examination reveals a small inguinal hernia on the left side.  It can be localized by palpation.  Acanthotic raised lesion also noted near the same area.  Assessment:    [id number] Patient Active Problem List   Diagnosis Date Noted   Chronic shoulder pain (Bilateral) 05/05/2021   Chronic use of opiate for therapeutic purpose 01/26/2021   Left thigh pain 04/88/8916   Uncomplicated opioid dependence ([place]) 04/08/2020   Pharmacologic therapy 04/08/2020   Pneumonia due to COVID-19 virus 02/03/2020  Numbness and tingling in right hand 12/31/2019   Numbness in feet 12/31/2019   Altered consciousness 11/26/2019   Frequent falls 11/26/2019   Weakness of both lower extremities 11/26/2019   Protruded lumbar disc (L2-3) (Left) 09/10/2019   Chronic knee pain (Left) 08/14/2019   Chronic lumbar radiculopathy/radiculitis (L2) (Left) 08/14/2019   Abnormal MRI, lumbar spine (07/23/2019) 08/14/2019   Acute on chronic  respiratory failure with hypoxia (HCC) 07/06/2019   Cervicalgia (Bilateral) (R>L) 09/24/2018   Spondylosis without myelopathy or radiculopathy, lumbosacral region 09/24/2018   Epigastric hernia 05/04/2018   Chronic lower extremity pain (Bilateral) (L>R) 01/25/2018   Altered mental status, unspecified 12/09/2017   Chronic hip pain (3ry area of Pain) (Bilateral) (L>R) 11/27/2017   COPD (chronic obstructive pulmonary disease) ([place]) 08/11/2017   Other long term (current) drug therapy 08/08/2017   Disorder of bone, unspecified 08/08/2017   COPD with hypoxia ([place]) 06/27/2017   Arthralgia of acromioclavicular joint (Right) 06/15/2017   Acromioclavicular joint DJD (Right) 06/15/2017   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   Chronic shoulder pain (Left) 05/11/2017   Elevated brain natriuretic peptide (BNP) level 05/11/2017   Grief at loss of child 02/14/2017   Lumbar facet hypertrophy (multilevel) 11/17/2016   Lumbar spinal stenosis (L4-5) 11/17/2016   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) 11/17/2016   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) 11/17/2016   Lumbosacral radiculopathy at L4 11/17/2016   Supplemental oxygen dependent 08/01/2016   Sleep apnea 06/02/2016   Long term current use of opiate analgesic 01/12/2016   Long term prescription opiate use 01/12/2016   Opiate use (15 MME/Day) 01/12/2016   Lumbar facet syndrome (Bilateral) (L>R) 01/12/2016   Chronic sacroiliac joint pain (Bilateral) 01/12/2016   Vitamin D deficiency 12/30/2015   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) 12/28/2015   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/19/2015   Coagulation disorder ([place]) 10/22/2015   Chronic pain syndrome (significant psychosocial component) 09/21/2015   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 09/21/2015   Encounter for therapeutic drug level monitoring 09/16/2015   Encounter for chronic pain management 09/16/2015   Pain  management 09/16/2015   Neurogenic pain 09/16/2015   Neuropathic pain 09/16/2015   Musculoskeletal pain 09/16/2015   Lumbar spondylosis (L4-5) 09/16/2015   Chronic low back pain (1ry area of Pain) (Bilateral) (L>R) w/o sciatica 09/16/2015   Chronic lower extremity pain (2ry area of Pain) (Left) 09/16/2015   Chronic lumbar radicular pain (L4 & S1 Dermatome) (Left) 09/16/2015   Chronic shoulder pain (Right side) 09/16/2015   Chronic upper extremity pain (Right-sided) 09/16/2015   Cervical radiculitis (Right side) 09/16/2015   Abnormal x-ray of lumbar spine 09/16/2015   Chronic diastolic heart failure ([place]) 07/15/2015   Chest pain 07/15/2015   Tobacco abuse 07/15/2015   Tendinitis 04/21/2015   Blood clotting disorder ([place]) 04/21/2015   Decreased motor strength 07/16/2014   Clinical depression 07/16/2014   Chronic rhinitis 07/16/2014   Carpal tunnel syndrome 07/16/2014   Anxiety state 07/16/2014   Nausea without vomiting 07/16/2014   Edema 07/16/2014   Generalized muscle weakness 07/16/2014   Neuritis or radiculitis due to rupture of lumbar intervertebral disc 07/02/2014   DDD (degenerative disc disease), lumbar 07/02/2014   CAFL (chronic airflow limitation) ([place]) 02/27/2014   Herpes zoster 07/16/2009   Essential (primary) hypertension 03/27/2008     1. Left inguinal hernia   2. History of abnormal cervical Pap smear   3. Urge incontinence of urine        Plan:  1.  Discussed inguinal hernia-surgical referral when patient is interested.  2.  We will try Detrol LA for urge incontinence  3.  Strongly recommend annual examination and follow-up Pap smear in the next 6 weeks. Orders No orders of the defined types were placed in this encounter.    Meds ordered this encounter  Medications   tolterodine (DETROL LA) 2 MG 24 hr capsule    Sig: Take 1 capsule (2 mg total) by mouth daily.    Dispense:  30 capsule    Refill:  2      F/U  Return in about 6 weeks  (around 06/30/2021) for Annual Physical. I spent 23 minutes involved in the care of this patient preparing to see the patient by obtaining and reviewing her medical history (including labs, imaging tests and prior procedures), documenting clinical information in the electronic health record (EHR), counseling and coordinating care plans, writing and sending prescriptions, ordering tests or procedures and in direct communicating with the patient and medical staff discussing pertinent items from her history and physical exam.  Finis Bud, M.D. 05/19/2021 11:56 AM

## 2021-05-22 DIAGNOSIS — J9610 Chronic respiratory failure, unspecified whether with hypoxia or hypercapnia: Secondary | ICD-10-CM | POA: Diagnosis not present

## 2021-05-24 ENCOUNTER — Other Ambulatory Visit: Payer: Self-pay | Admitting: Family Medicine

## 2021-05-24 DIAGNOSIS — F411 Generalized anxiety disorder: Secondary | ICD-10-CM

## 2021-05-24 NOTE — Telephone Encounter (Signed)
Medication Refill - Medication: Sertraline  Has the patient contacted their pharmacy? Yes.   Pt was advised to contact PCP to set up an appt. Pt has an appt scheduled on 06/10/21. Please advise. (Agent: If no, request that the patient contact the pharmacy for the refill.) (Agent: If yes, when and what did the pharmacy advise?)  Preferred Pharmacy (with phone number or street name):  [facility] - [place], [place] [address]  Phone: [phone] Fax: [phone]  Hours: Not open 24 hours    Agent: Please be advised that RX refills may take up to 3 business days. We ask that you follow-up with your pharmacy.

## 2021-05-24 NOTE — Progress Notes (Signed)
Patient: Theresa [name]  Service Category: E/M  Provider: Gaspar Cola, MD  DOB: 27-Jan-1956  DOS: 05/27/2021  Location: Office  MRN: [id number]  Setting: Ambulatory outpatient  Referring Provider: Margo Common, PA-C  Type: Established Patient  Specialty: Interventional Pain Management  PCP: Margo Common, PA-C  Location: Remote location  Delivery: TeleHealth     Virtual Encounter - Pain Management PROVIDER NOTE: Information contained herein reflects review and annotations entered in association with encounter. Interpretation of such information and data should be left to medically-trained personnel. Information provided to patient can be located elsewhere in the medical record under "Patient Instructions". Document created using STT-dictation technology, any transcriptional errors that may result from process are unintentional.    Contact & Pharmacy Preferred: [phone] Home: [phone] (home) Mobile: [phone] (mobile) E-mail: [email or web address]  [place], [address] [place] [address] Phone: [phone] Fax: [facility] #[id number] [facility], [place] [address] [place] [address] Phone: [phone] Fax: [phone]   Pre-screening  Theresa [name] offered "in-person" vs "virtual" encounter. She indicated preferring virtual for this encounter.   Reason COVID-19*  Social distancing based on CDC and AMA recommendations.   I contacted Theresa [name] on 05/27/2021 via telephone.      I clearly identified myself as Gaspar Cola, MD. I verified that I was speaking with the correct person using two identifiers (Name: Ardyce Heyer, and date of birth: December 12, 1955).  Consent I sought verbal advanced consent from Theresa [name] for virtual visit interactions. I informed Theresa [name] of possible security and privacy concerns, risks, and limitations  associated with providing "not-in-person" medical evaluation and management services. I also informed Theresa [name] of the availability of "in-person" appointments. Finally, I informed her that there would be a charge for the virtual visit and that she could be  personally, fully or partially, financially responsible for it. Theresa [name] expressed understanding and agreed to proceed.   Historic Elements   Theresa [name] is a 65 y.o. year old, female patient evaluated today after our last contact on 05/13/2021. Theresa [name]  has a past medical history of Acute postoperative pain (10/03/2017), Anxiety, BiPAP (biphasic positive airway pressure) dependence, Carpal tunnel syndrome, CHF (congestive heart failure) ([place]), CHF (congestive heart failure) ([place]), Chronic generalized abdominal pain, Chronic rhinitis, COPD (chronic obstructive pulmonary disease) ([place]), DDD (degenerative disc disease), cervical, DDD (degenerative disc disease), lumbosacral, Depression, Dyspnea, Edema, Flu, Gastritis, GERD (gastroesophageal reflux disease), Hematuria, Hernia of abdominal wall (07/17/2018), Hernia, abdominal, Hypertension, Kidney stones, Low back pain, Lumbar radiculitis, Malodorous urine, Muscle weakness, Obesity, Oxygen dependent, Renal cyst, Sensory urge incontinence, Thyroid activity decreased, Tobacco abuse, and Wheezing. She also  has a past surgical history that includes Abdominal hysterectomy; Tonsillectomy; Carpal tunnel release (Left, 2012); Tubal ligation; epigastric hernia repair (N/A, 08/13/2018); Umbilical hernia repair (N/A, 08/13/2018); and RIGHT/LEFT HEART CATH AND CORONARY ANGIOGRAPHY (N/A, 07/11/2019). Ms. Harbin has a current medication list which includes the following prescription(s): albuterol, aspirin ec, cholecalciferol, diazepam, digoxin, fluticasone-umeclidin-vilant, hydrocodone-acetaminophen, [START ON 06/04/2021] hydrocodone-acetaminophen, [START ON 07/04/2021] hydrocodone-acetaminophen,  ipratropium-albuterol, levothyroxine, oxygen-helium, potassium chloride sa, daliresp, sertraline, tolterodine, torsemide, baclofen, and gabapentin. She  reports that she has been smoking cigarettes. She has a 22.00 pack-year smoking history. She has never used smokeless tobacco. She reports that she does not drink alcohol and does not use drugs. Theresa [name] is allergic to codeine  and meloxicam.   HPI  Today, she is being contacted for a post-procedure assessment. The patient indicates currently enjoying an ongoing 50% relief of her low back pain.  However, she refers still having a deep ache in the lower back, still on the left side.  Reviewing the available imaging, we have thatthere is an MRI of the lumbar spine done on 07/23/2019 which indicates that at the L2-3 level there is a large left subarticular disc extrusion that extends inferiorly into the left L3-4 neuroforamen.  There is mass-effect on the descending left-sided nerve roots and this could be irritating the area L2 and L3 nerve roots which in turn may be responsible for the back pain symptoms that she has been experiencing.  Today we talked about the alternative treatments and we have decided to schedule her for a left-sided L2-3 LESI under fluoroscopic guidance to see if we can eliminate that pain from that area.  The plan was shared with the patient who understood and accepted.  Post-Procedure Evaluation  Procedure (05/13/2021):  Type: Lumbar Facet, Medial Branch Block(s)          Primary Purpose: Diagnostic Region: Posterolateral Lumbosacral Spine Level: L2, L3, L4, L5, & S1 Medial Branch Level(s). Injecting these levels blocks the L3-4, L4-5, and L5-S1 lumbar facet joints. Laterality: Left  Pre-procedure pain level: 5/10 Post-procedure: 0/10 (100% relief)  Anxiolysis: none.  Effectiveness during initial hour after procedure (Ultra-Short Term Relief): 100 %.  Local anesthetic used: Long-acting (4-6 hours) Effectiveness: Defined as  any analgesic benefit obtained secondary to the administration of local anesthetics. This carries significant diagnostic value as to the etiological location, or anatomical origin, of the pain. Duration of benefit is expected to coincide with the duration of the local anesthetic used.  Effectiveness during initial 4-6 hours after procedure (Short-Term Relief): 100 %.  Long-term benefit: Defined as any relief past the pharmacologic duration of the local anesthetics.  Effectiveness past the initial 6 hours after procedure (Long-Term Relief): 50 % (current).  Benefits, current: Defined as benefit present at the time of this evaluation.   Analgesia:  The patient indicates currently enjoying an ongoing 50% relief of her low back pain.  However, she refers still having a deep ache in the lower back, still on the left side. Function: Theresa [name] reports improvement in function ROM: Theresa [name] reports improvement in ROM  Pharmacotherapy Assessment   Analgesic: Hydrocodone/APAP 5/325 one tablet every 8 hours (15 mg/day of hydrocodone) MME/day: 15 mg/day.   Monitoring: [place] PMP: PDMP reviewed during this encounter.       Pharmacotherapy: No side-effects or adverse reactions reported. Compliance: No problems identified. Effectiveness: Clinically acceptable. Plan: Refer to "POC". UDS:  Summary  Date Value Ref Range Status  05/05/2021 Note  Final    Comment:    ==================================================================== [employer] Select 13 (MW) ==================================================================== Specimen Alert Note: Urinary creatinine is low; ability to detect some drugs may be compromised. Interpret results with caution. (Creatinine) ==================================================================== Test                             Result       Flag       Units  Drug Present and Declared for Prescription Verification   Hydrocodone                    1236          EXPECTED   ng/mg creat   Norhydrocodone  986          EXPECTED   ng/mg creat    Sources of hydrocodone include scheduled prescription medications.    Norhydrocodone is an expected metabolite of hydrocodone.  Drug Absent but Declared for Prescription Verification   Diazepam                       Not Detected UNEXPECTED ng/mg creat ==================================================================== Test                      Result    Flag   Units      Ref Range   Creatinine              14        LL     mg/dL      >=20 ==================================================================== Declared Medications:  The flagging and interpretation on this report are based on the  following declared medications.  Unexpected results may arise from  inaccuracies in the declared medications.   **Note: The testing scope of this panel includes these medications:   Diazepam (Valium)  Hydrocodone (Norco)   **Note: The testing scope of this panel does not include the  following reported medications:   Acetaminophen (Norco)  Albuterol (Ventolin HFA)  Aspirin  Baclofen (Lioresal)  Cholecalciferol  Digoxin (Lanoxin)  Fluticasone  Gabapentin (Neurontin)  Umeclidinium  Vilanterol ==================================================================== For clinical consultation, please call [phone]. ====================================================================      Laboratory Chemistry Profile   Renal Lab Results  Component Value Date   BUN 14 02/04/2021   CREATININE 0.80 02/04/2021   BCR 16 03/04/2020   GFRAA >60 06/26/2020   GFRNONAA >60 02/04/2021    Hepatic Lab Results  Component Value Date   AST 21 06/25/2020   ALT 22 06/25/2020   ALBUMIN 3.9 06/25/2020   ALKPHOS 94 06/25/2020   LIPASE 31 10/17/2017   AMMONIA 40 (H) 10/11/2019    Electrolytes Lab Results  Component Value Date   NA 136 02/04/2021   K 3.8 02/04/2021   CL 100 02/04/2021   CALCIUM  9.1 02/04/2021   MG 2.7 (H) 02/10/2020   PHOS 4.0 02/10/2020    Bone Lab Results  Component Value Date   25OHVITD1 13 (L) 09/17/2015   25OHVITD2 <1.0 09/17/2015   25OHVITD3 13 09/17/2015    Inflammation (CRP: Acute Phase) (ESR: Chronic Phase) Lab Results  Component Value Date   CRP 4.2 (H) 02/08/2020   ESRSEDRATE 16 08/08/2017   LATICACIDVEN 1.4 02/03/2020         Note: Above Lab results reviewed.  Imaging  DG PAIN CLINIC C-ARM 1-60 MIN NO REPORT Fluoro was used, but no Radiologist interpretation will be provided.  Please refer to "NOTES" tab for provider progress note.  Assessment  The primary encounter diagnosis was Lumbar facet syndrome (Bilateral) (L>R). 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  Plan of Care  Problem-specific:  No problem-specific Assessment & Plan notes found for this encounter.  Theresa [name] has a current medication list which includes the following long-term medication(s): albuterol, digoxin, hydrocodone-acetaminophen, [START ON 06/04/2021] hydrocodone-acetaminophen, [START ON 07/04/2021] hydrocodone-acetaminophen, ipratropium-albuterol, levothyroxine, potassium chloride sa, sertraline, torsemide, baclofen, and gabapentin.  Pharmacotherapy (Medications Ordered): No orders of the defined types were placed in this encounter.  Orders:  Orders Placed This Encounter  Procedures   Lumbar Epidural Injection    Standing Status:   Future    Standing Expiration Date:   06/27/2021    Scheduling Instructions:     Procedure: Interlaminar Lumbar Epidural Steroid injection (LESI)  L2-3     Laterality: Left-sided     Sedation: None required.     Timeframe: ASAA    Order Specific Question:   Where will this procedure be performed?    Answer:   [facility]    Follow-up plan:   Return for (no sedation) procedure: (L) L2-3 LESI.     Interventional Therapies  Risk  Complexity Considerations:   Estimated body mass index is 33.11 kg/m as calculated from the following:   Height as of this encounter: _0  (1.575 m).   Weight as of this encounter: 181 lb (82.1 kg). NOTE: NO further Lumbar RFA until BMI <35.   Planned  Pending:   Palliative bilateral lumbar facet block #4 (02/12/2019)    Under consideration:   Palliative procedures    Completed:   Palliative bilateral lumbar facet block x3  Therapeutic left L2-3 LESI #1 (09/10/2019)  Diagnostic/palliative left L4 TFESI x1  Diagnostic/palliative left L4-5 LESI x2  Diagnostic/palliative right L4 TFESI x1  Diagnostic/palliative right L4-5 LESI x1  Palliative left lumbar facet RFA x1 (06/27/2017)  Palliative right lumbar facet RFA x1 (10/03/2017)    Therapeutic  Palliative (PRN) options:   Palliative bilateral lumbar facet block #4  Diagnostic/palliative left L4 TFESI #2  Diagnostic/palliative left L4-5 LESI #3  Diagnostic/palliative right L4 TFESI #2  Diagnostic/palliative right L4-5 LESI #2  Palliative left lumbar facet RFA #2  Palliative right lumbar facet RFA #2         Recent Visits Date Type Provider Dept  05/13/21 Procedure visit Milinda Pointer, MD [facility]  05/05/21 Office Visit Milinda Pointer, MD [facility]  Showing recent visits within past 90 days and meeting all other requirements Today's Visits Date Type Provider Dept  05/27/21 Telemedicine Milinda Pointer, MD [facility]  Showing today's visits and meeting all other requirements Future Appointments Date Type Provider Dept  07/28/21 Appointment Milinda Pointer, MD [facility]  Showing future appointments within next 90 days and meeting all other requirements I discussed the assessment and treatment plan with the patient. The patient was provided  an opportunity to ask questions and all were answered. The patient agreed with the plan and demonstrated an understanding of the instructions.  Patient advised to call back or seek an in-person evaluation if the symptoms or condition worsens.  Duration of encounter: 18 minutes.  Note by: Gaspar Cola, MD Date: 05/27/2021; Time: 5:37 PM

## 2021-05-24 NOTE — Telephone Encounter (Signed)
  Notes to clinic:  Patient has appt scheduled for 06/10/2021 Review for refill    Requested Prescriptions  Pending Prescriptions Disp Refills   sertraline (ZOLOFT) 100 MG tablet 60 tablet 0    Sig: Take 2 tablets (200 mg total) by mouth every morning.      Psychiatry:  Antidepressants - SSRI Failed - 05/24/2021 12:35 PM      Failed - Valid encounter within last 6 months    Recent Outpatient Visits           8 months ago COPD exacerbation [facility])   [place], PA-C   10 months ago Need for influenza vaccination   [place], PA-C   11 months ago Chronic obstructive pulmonary disease with acute exacerbation [facility])   [facility] [place], Wendee Beavers, PA-C   1 year ago Essential (primary) hypertension   [employer], Vickki Muff, PA-C   1 year ago Weakness   [employer], Vickki Muff, PA-C       Future Appointments             In 3 days Milinda Pointer, MD [place]   In 2 weeks [place], Vickki Muff, PA-C [employer], [place]   In 1 month Amalia Hailey, Nyoka Lint, MD [facility] - Completed PHQ-2 or PHQ-9 in the last 360 days

## 2021-05-26 ENCOUNTER — Telehealth: Payer: Self-pay

## 2021-05-26 ENCOUNTER — Encounter: Payer: Self-pay | Admitting: Pain Medicine

## 2021-05-26 NOTE — Telephone Encounter (Signed)
LM  to call office for pre virtual appointment questions

## 2021-05-27 ENCOUNTER — Other Ambulatory Visit: Payer: Self-pay

## 2021-05-27 ENCOUNTER — Ambulatory Visit: Payer: Medicare HMO | Attending: Pain Medicine | Admitting: Pain Medicine

## 2021-05-27 DIAGNOSIS — M5126 Other intervertebral disc displacement, lumbar region: Secondary | ICD-10-CM | POA: Diagnosis not present

## 2021-05-27 DIAGNOSIS — M47816 Spondylosis without myelopathy or radiculopathy, lumbar region: Secondary | ICD-10-CM | POA: Diagnosis not present

## 2021-05-27 DIAGNOSIS — M5136 Other intervertebral disc degeneration, lumbar region: Secondary | ICD-10-CM | POA: Diagnosis not present

## 2021-05-27 DIAGNOSIS — G8929 Other chronic pain: Secondary | ICD-10-CM | POA: Diagnosis not present

## 2021-05-27 DIAGNOSIS — M5416 Radiculopathy, lumbar region: Secondary | ICD-10-CM | POA: Diagnosis not present

## 2021-05-27 DIAGNOSIS — M545 Low back pain, unspecified: Secondary | ICD-10-CM | POA: Diagnosis not present

## 2021-05-27 DIAGNOSIS — G894 Chronic pain syndrome: Secondary | ICD-10-CM

## 2021-05-27 NOTE — Patient Instructions (Signed)
______________________________________________________________________  Preparing for your procedure (without sedation)  Procedure appointments are limited to planned procedures: No Prescription Refills. No disability issues will be discussed. No medication changes will be discussed.  Instructions: Oral Intake: Do not eat or drink anything for at least 6 hours prior to your procedure. (Exception: Blood Pressure Medication. See below.) Transportation: Unless otherwise stated by your physician, you may drive yourself after the procedure. Blood Pressure Medicine: Do not forget to take your blood pressure medicine with a sip of water the morning of the procedure. If your Diastolic (lower reading)is above 100 mmHg, elective cases will be cancelled/rescheduled. Blood thinners: These will need to be stopped for procedures. Notify our staff if you are taking any blood thinners. Depending on which one you take, there will be specific instructions on how and when to stop it. Diabetics on insulin: Notify the staff so that you can be scheduled 1st case in the morning. If your diabetes requires high dose insulin, take only  of your normal insulin dose the morning of the procedure and notify the staff that you have done so. Preventing infections: Shower with an antibacterial soap the morning of your procedure.  Build-up your immune system: Take 1000 mg of Vitamin C with every meal (3 times a day) the day prior to your procedure. Antibiotics: Inform the staff if you have a condition or reason that requires you to take antibiotics before dental procedures. Pregnancy: If you are pregnant, call and cancel the procedure. Sickness: If you have a cold, fever, or any active infections, call and cancel the procedure. Arrival: You must be in the facility at least 30 minutes prior to your scheduled procedure. Children: Do not bring any children with you. Dress appropriately: Bring dark clothing that you would not mind  if they get stained. Valuables: Do not bring any jewelry or valuables.  Reasons to call and reschedule or cancel your procedure: (Following these recommendations will minimize the risk of a serious complication.) Surgeries: Avoid having procedures within 2 weeks of any surgery. (Avoid for 2 weeks before or after any surgery). Flu Shots: Avoid having procedures within 2 weeks of a flu shots or . (Avoid for 2 weeks before or after immunizations). Barium: Avoid having a procedure within 7-10 days after having had a radiological study involving the use of radiological contrast. (Myelograms, Barium swallow or enema study). Heart attacks: Avoid any elective procedures or surgeries for the initial 6 months after a "Myocardial Infarction" (Heart Attack). Blood thinners: It is imperative that you stop these medications before procedures. Let us know if you if you take any blood thinner.  Infection: Avoid procedures during or within two weeks of an infection (including chest colds or gastrointestinal problems). Symptoms associated with infections include: Localized redness, fever, chills, night sweats or profuse sweating, burning sensation when voiding, cough, congestion, stuffiness, runny nose, sore throat, diarrhea, nausea, vomiting, cold or Flu symptoms, recent or current infections. It is specially important if the infection is over the area that we intend to treat. Heart and lung problems: Symptoms that may suggest an active cardiopulmonary problem include: cough, chest pain, breathing difficulties or shortness of breath, dizziness, ankle swelling, uncontrolled high or unusually low blood pressure, and/or palpitations. If you are experiencing any of these symptoms, cancel your procedure and contact your primary care physician for an evaluation.  Remember:  Regular Business hours are:  Monday to Thursday 8:00 AM to 4:00 PM  Provider's Schedule: Milinda Pointer, MD:  Procedure days: Tuesday and Thursday  7:30 AM to 4:00 PM  Gillis Santa, MD:  Procedure days: Monday and Wednesday 7:30 AM to 4:00 PM ______________________________________________________________________  ____________________________________________________________________________________________  General Risks and Possible Complications  Patient Responsibilities: It is important that you read this as it is part of your informed consent. It is our duty to inform you of the risks and possible complications associated with treatments offered to you. It is your responsibility as a patient to read this and to ask questions about anything that is not clear or that you believe was not covered in this document.  Patient's Rights: You have the right to refuse treatment. You also have the right to change your mind, even after initially having agreed to have the treatment done. However, under this last option, if you wait until the last second to change your mind, you may be charged for the materials used up to that point.  Introduction: Medicine is not an Chief Strategy Officer. Everything in Medicine, including the lack of treatment(s), carries the potential for danger, harm, or loss (which is by definition: Risk). In Medicine, a complication is a secondary problem, condition, or disease that can aggravate an already existing one. All treatments carry the risk of possible complications. The fact that a side effects or complications occurs, does not imply that the treatment was conducted incorrectly. It must be clearly understood that these can happen even when everything is done following the highest safety standards.  No treatment: You can choose not to proceed with the proposed treatment alternative. The "PRO(s)" would include: avoiding the risk of complications associated with the therapy. The "CON(s)" would include: not getting any of the treatment benefits. These benefits fall under one of three categories: diagnostic; therapeutic; and/or palliative.  Diagnostic benefits include: getting information which can ultimately lead to improvement of the disease or symptom(s). Therapeutic benefits are those associated with the successful treatment of the disease. Finally, palliative benefits are those related to the decrease of the primary symptoms, without necessarily curing the condition (example: decreasing the pain from a flare-up of a chronic condition, such as incurable terminal cancer).  General Risks and Complications: These are associated to most interventional treatments. They can occur alone, or in combination. They fall under one of the following six (6) categories: no benefit or worsening of symptoms; bleeding; infection; nerve damage; allergic reactions; and/or death. No benefits or worsening of symptoms: In Medicine there are no guarantees, only probabilities. No healthcare provider can ever guarantee that a medical treatment will work, they can only state the probability that it may. Furthermore, there is always the possibility that the condition may worsen, either directly, or indirectly, as a consequence of the treatment. Bleeding: This is more common if the patient is taking a blood thinner, either prescription or over the counter (example: Goody Powders, Fish oil, Aspirin, Garlic, etc.), or if suffering a condition associated with impaired coagulation (example: [name], cirrhosis of the liver, low platelet counts, etc.). However, even if you do not have one on these, it can still happen. If you have any of these conditions, or take one of these drugs, make sure to notify your treating physician. Infection: This is more common in patients with a compromised immune system, either due to disease (example: diabetes, cancer, human immunodeficiency virus [HIV], etc.), or due to medications or treatments (example: therapies used to treat cancer and rheumatological diseases). However, even if you do not have one on these, it can still happen. If you  have any of these conditions, or take  one of these drugs, make sure to notify your treating physician. Nerve Damage: This is more common when the treatment is an invasive one, but it can also happen with the use of medications, such as those used in the treatment of cancer. The damage can occur to small secondary nerves, or to large primary ones, such as those in the spinal cord and brain. This damage may be temporary or permanent and it may lead to impairments that can range from temporary numbness to permanent paralysis and/or brain death. Allergic Reactions: Any time a substance or material comes in contact with our body, there is the possibility of an allergic reaction. These can range from a mild skin rash (contact dermatitis) to a severe systemic reaction (anaphylactic reaction), which can result in death. Death: In general, any medical intervention can result in death, most of the time due to an unforeseen complication. ____________________________________________________________________________________________

## 2021-06-01 DIAGNOSIS — J439 Emphysema, unspecified: Secondary | ICD-10-CM | POA: Diagnosis not present

## 2021-06-01 DIAGNOSIS — Z20822 Contact with and (suspected) exposure to covid-19: Secondary | ICD-10-CM | POA: Diagnosis not present

## 2021-06-01 DIAGNOSIS — R0609 Other forms of dyspnea: Secondary | ICD-10-CM | POA: Diagnosis not present

## 2021-06-01 DIAGNOSIS — Z03818 Encounter for observation for suspected exposure to other biological agents ruled out: Secondary | ICD-10-CM | POA: Diagnosis not present

## 2021-06-01 DIAGNOSIS — F17218 Nicotine dependence, cigarettes, with other nicotine-induced disorders: Secondary | ICD-10-CM | POA: Diagnosis not present

## 2021-06-01 DIAGNOSIS — I272 Pulmonary hypertension, unspecified: Secondary | ICD-10-CM | POA: Diagnosis not present

## 2021-06-01 DIAGNOSIS — Z9981 Dependence on supplemental oxygen: Secondary | ICD-10-CM | POA: Diagnosis not present

## 2021-06-08 DIAGNOSIS — J449 Chronic obstructive pulmonary disease, unspecified: Secondary | ICD-10-CM | POA: Diagnosis not present

## 2021-06-09 DIAGNOSIS — M25512 Pain in left shoulder: Secondary | ICD-10-CM | POA: Diagnosis not present

## 2021-06-09 DIAGNOSIS — M7541 Impingement syndrome of right shoulder: Secondary | ICD-10-CM | POA: Diagnosis not present

## 2021-06-09 DIAGNOSIS — M25511 Pain in right shoulder: Secondary | ICD-10-CM | POA: Diagnosis not present

## 2021-06-09 DIAGNOSIS — M7542 Impingement syndrome of left shoulder: Secondary | ICD-10-CM | POA: Diagnosis not present

## 2021-06-10 ENCOUNTER — Other Ambulatory Visit: Payer: Self-pay

## 2021-06-10 ENCOUNTER — Ambulatory Visit (INDEPENDENT_AMBULATORY_CARE_PROVIDER_SITE_OTHER): Payer: Medicare HMO | Admitting: Family Medicine

## 2021-06-10 ENCOUNTER — Encounter: Payer: Self-pay | Admitting: Family Medicine

## 2021-06-10 VITALS — BP 118/64 | HR 82 | Temp 98.7°F | Wt 180.0 lb

## 2021-06-10 DIAGNOSIS — J449 Chronic obstructive pulmonary disease, unspecified: Secondary | ICD-10-CM | POA: Diagnosis not present

## 2021-06-10 DIAGNOSIS — I5032 Chronic diastolic (congestive) heart failure: Secondary | ICD-10-CM | POA: Diagnosis not present

## 2021-06-10 DIAGNOSIS — I10 Essential (primary) hypertension: Secondary | ICD-10-CM

## 2021-06-10 DIAGNOSIS — F411 Generalized anxiety disorder: Secondary | ICD-10-CM

## 2021-06-10 DIAGNOSIS — R0902 Hypoxemia: Secondary | ICD-10-CM

## 2021-06-10 DIAGNOSIS — M47817 Spondylosis without myelopathy or radiculopathy, lumbosacral region: Secondary | ICD-10-CM | POA: Diagnosis not present

## 2021-06-10 DIAGNOSIS — E039 Hypothyroidism, unspecified: Secondary | ICD-10-CM | POA: Diagnosis not present

## 2021-06-10 MED ORDER — LEVOTHYROXINE SODIUM 25 MCG PO TABS: ORAL_TABLET | ORAL | 1 refills | Status: DC

## 2021-06-10 MED ORDER — SERTRALINE HCL 100 MG PO TABS: 200.0000 mg | ORAL_TABLET | Freq: Every morning | ORAL | 0 refills | Status: DC

## 2021-06-10 NOTE — Progress Notes (Signed)
Established patient visit   Patient: Theresa [name]   DOB: 15-Feb-1956   65 y.o. Female  MRN: [id number] Visit Date: 06/10/2021  Today's healthcare provider: [name], PA-C   No chief complaint on file.  Subjective  -------------------------------------------------------------------------------------------------------------------- HPI  Hypertension, follow-up  BP Readings from Last 3 Encounters:  06/10/21 118/64  05/19/21 121/66  05/13/21 136/74   Wt Readings from Last 3 Encounters:  06/10/21 180 lb (81.6 kg)  05/19/21 180 lb (81.6 kg)  05/13/21 177 lb (80.3 kg)     She was last seen for hypertension 14 months ago.  BP at that visit was as above. Management since that visit includes none.  She reports good compliance with treatment. She is not having side effects.  She is following a Regular diet. She is not exercising. She does smoke.  Use of agents associated with hypertension: none.   Outside blood pressures are 100/54 at home. Symptoms: No chest pain No chest pressure  No palpitations No syncope  No dyspnea No orthopnea  No paroxysmal nocturnal dyspnea No lower extremity edema   Pertinent labs: No results found for: CHOL, HDL, LDLCALC, LDLDIRECT, TRIG, CHOLHDL Lab Results  Component Value Date   NA 136 02/04/2021   K 3.8 02/04/2021   CREATININE 0.80 02/04/2021   GFRNONAA >60 02/04/2021   GFRAA >60 06/26/2020   GLUCOSE 98 02/04/2021     The ASCVD Risk score (Goff DC Jr., et al., 2013) failed to calculate for the following reasons:   Cannot find a previous HDL lab   Cannot find a previous total cholesterol lab   --------------------------------------------------------------------------------------------------- Hypothyroid, follow-up  Lab Results  Component Value Date   TSH 1.520 03/04/2020   TSH 0.805 11/16/2018   TSH 0.468 03/23/2018   T4TOTAL 5.5 03/04/2020   T4TOTAL 6.1 11/16/2018   Wt Readings from Last 3 Encounters:  06/10/21  180 lb (81.6 kg)  05/19/21 180 lb (81.6 kg)  05/13/21 177 lb (80.3 kg)    She was last seen for hypothyroid 14 months ago.  Management since that visit includes Levothyroxine 25 mcg qd.. She reports good compliance with treatment. She is having side effects.   Symptoms: No change in energy level No constipation  No diarrhea No heat / cold intolerance  No nervousness No palpitations  No weight changes    -----------------------------------------------------------------------------------------  Past Medical History:  Diagnosis Date   Acute postoperative pain 10/03/2017   Anxiety    BiPAP (biphasic positive airway pressure) dependence    at hs   Carpal tunnel syndrome    CHF (congestive heart failure) ([place])    1/18   CHF (congestive heart failure) ([facility])    Chronic generalized abdominal pain    Chronic rhinitis    COPD (chronic obstructive pulmonary disease) ([facility])    DDD (degenerative disc disease), cervical    DDD (degenerative disc disease), lumbosacral    Depression    Dyspnea    Edema    Flu    1/18   Gastritis    GERD (gastroesophageal reflux disease)    Hematuria    Hernia of abdominal wall 07/17/2018   Hernia, abdominal    Hypertension    Kidney stones    Low back pain    Lumbar radiculitis    Malodorous urine    Muscle weakness    Obesity    Oxygen dependent    Renal cyst    Sensory urge incontinence    Thyroid activity decreased  9/19   Tobacco abuse    Wheezing    Past Surgical History:  Procedure Laterality Date   ABDOMINAL HYSTERECTOMY     CARPAL TUNNEL RELEASE Left 2012   EPIGASTRIC HERNIA REPAIR N/A 08/13/2018   HERNIA REPAIR EPIGASTRIC ADULT; polypropylene mesh reinforcement.  Surgeon: Robert Bellow, MD;  Location: [facility];  Service: General;  Laterality: N/A;   RIGHT/LEFT HEART CATH AND CORONARY ANGIOGRAPHY N/A 07/11/2019   Procedure: RIGHT/LEFT HEART CATH AND CORONARY ANGIOGRAPHY;  Surgeon: Yolonda Kida, MD;  Location: [facility];  Service: Cardiovascular;  Laterality: N/A;   TONSILLECTOMY     TUBAL LIGATION     UMBILICAL HERNIA REPAIR N/A 08/13/2018   HERNIA REPAIR UMBILICAL ADULT; polypropylene mesh reinforcement Surgeon: Robert Bellow, MD;  Location: [facility];  Service: General;  Laterality: N/A;   Family History  Problem Relation Age of Onset   Heart disease Mother    Stroke Mother    Coronary artery disease Mother    Lung cancer Sister    Cancer Sister    Breast cancer Sister    Alcohol abuse Father    Heart disease Father    Cancer Brother    Cancer Brother    Pneumonia Brother    Prostate cancer Neg Hx    Kidney cancer Neg Hx    Bladder Cancer Neg Hx    Social History   Tobacco Use   Smoking status: Every Day    Packs/day: 0.50    Years: 44.00    Pack years: 22.00    Types: Cigarettes   Smokeless tobacco: Never   Tobacco comments:    over a half pack  Vaping Use   Vaping Use: Never used  Substance Use Topics   Alcohol use: No   Drug use: No   Allergies  Allergen Reactions   Codeine Nausea And Vomiting   Meloxicam Other (See Comments)    Stomach pain   Medications: Outpatient Medications Prior to Visit  Medication Sig   albuterol (VENTOLIN HFA) 108 (90 Base) MCG/ACT inhaler Inhale 2 puffs into the lungs every 6 (six) hours as needed for wheezing or shortness of breath.   aspirin EC 81 MG tablet Take 81 mg by mouth daily.   baclofen (LIORESAL) 10 MG tablet Take 1 tablet (10 mg total) by mouth 3 (three) times daily.   Cholecalciferol 50 MCG (2000 UT) CAPS Take 2,000 Units by mouth daily.    diazepam (VALIUM) 10 MG tablet Take 1 tablet (10 mg total) by mouth 60 (sixty) minutes before procedure for 1 dose. Take with a sip of water, on an empty stomach. Do not eat anything for 6 hours prior to procedure. (Patient not taking: Reported on 06/10/2021)   digoxin (LANOXIN) 0.25 MG tablet TAKE 1 TABLET(0.25 MG) BY MOUTH DAILY   Fluticasone-Umeclidin-Vilant 100-62.5-25  MCG/INH AEPB Inhale 1 puff into the lungs daily.   gabapentin (NEURONTIN) 400 MG capsule Take 1 capsule (400 mg total) by mouth 3 (three) times daily.   HYDROcodone-acetaminophen (NORCO/VICODIN) 5-325 MG tablet Take 1 tablet by mouth every 8 (eight) hours as needed for severe pain. Must last 30 days   HYDROcodone-acetaminophen (NORCO/VICODIN) 5-325 MG tablet Take 1 tablet by mouth every 8 (eight) hours as needed for severe pain. Must last 30 days   [START ON 07/04/2021] HYDROcodone-acetaminophen (NORCO/VICODIN) 5-325 MG tablet Take 1 tablet by mouth every 8 (eight) hours as needed for severe pain. Must last 30 days   ipratropium-albuterol (DUONEB) 0.5-2.5 (3)  MG/3ML SOLN USE 1 VIAL VIA NEBULIZER EVERY 6 HOURS AS NEEDED FOR SHORTNESS OF BREATH OR WHEEZING   levothyroxine (SYNTHROID) 25 MCG tablet TAKE 1 TABLET(25 MCG) BY MOUTH DAILY BEFORE BREAKFAST   OXYGEN Inhale 2.5 L/min into the lungs.   potassium chloride SA (KLOR-CON) 20 MEQ tablet TAKE 1 TABLET(20 MEQ) BY MOUTH TWICE DAILY   Roflumilast (DALIRESP) 250 MCG TABS Take 500 mcg by mouth daily as needed.   sertraline (ZOLOFT) 100 MG tablet TAKE 2 TABLETS BY MOUTH EVERY MORNING   tolterodine (DETROL LA) 2 MG 24 hr capsule Take 1 capsule (2 mg total) by mouth daily.   torsemide (DEMADEX) 20 MG tablet Take 2 tablets (40 mg total) by mouth 2 (two) times daily.   No facility-administered medications prior to visit.    Review of Systems  Constitutional: Negative.   HENT: Negative.    Respiratory:  Positive for shortness of breath.   Cardiovascular: Negative.   Gastrointestinal: Negative.   Psychiatric/Behavioral:  The patient is nervous/anxious.        Objective  -------------------------------------------------------------------------------------------------------------------- BP 118/64 (BP Location: Right Arm, Patient Position: Sitting, Cuff Size: Normal)   Pulse 82   Temp 98.7 F (37.1 C) (Oral)   Wt 180 lb (81.6 kg)   SpO2 96% Comment:  2.5 LPM of O2  BMI 32.92 kg/m  BP Readings from Last 3 Encounters:  06/10/21 118/64  05/19/21 121/66  05/13/21 136/74   Wt Readings from Last 3 Encounters:  06/10/21 180 lb (81.6 kg)  05/19/21 180 lb (81.6 kg)  05/13/21 177 lb (80.3 kg)    Physical Exam Constitutional:      General: She is not in acute distress.    Appearance: She is well-developed.  HENT:     Head: Normocephalic and atraumatic.     Right Ear: Hearing and tympanic membrane normal.     Left Ear: Hearing and tympanic membrane normal.     Nose: Nose normal.  Eyes:     General: Lids are normal. No scleral icterus.       Right eye: No discharge.        Left eye: No discharge.     Conjunctiva/sclera: Conjunctivae normal.  Cardiovascular:     Rate and Rhythm: Normal rate and regular rhythm.     Heart sounds: Normal heart sounds.  Pulmonary:     Effort: Pulmonary effort is normal. No respiratory distress.     Comments: Using oxygen by nasal cannula 2-3 LPM. No rales or rhonchi today. Abdominal:     General: Bowel sounds are normal.     Palpations: Abdomen is soft.  Musculoskeletal:        General: Normal range of motion.     Cervical back: Neck supple.  Skin:    Findings: No lesion or rash.  Neurological:     Mental Status: She is alert and oriented to person, place, and time.  Psychiatric:        Speech: Speech normal.        Behavior: Behavior normal.        Thought Content: Thought content normal.      No results found for any visits on 06/10/21.  Assessment & Plan  ---------------------------------------------------------------------------------------------------------------------- 1. Essential (primary) hypertension Well controlled using Demadex 40 mg qd with Klor-Con 20 meq BID to control CHF. Recheck labs. No chest pains or palpitations today. - CBC with Differential/Platelet - Comprehensive metabolic panel - Lipid Panel With LDL/HDL Ratio - TSH  2. Hypothyroidism, unspecified type Needs  refill of Levothyroxine 25 mcg qd. No tremor, exophthalmos or hair loss. Recheck follow up labs. - levothyroxine (SYNTHROID) 25 MCG tablet; TAKE 1 TABLET(25 MCG) BY MOUTH DAILY BEFORE BREAKFAST  Dispense: 90 tablet; Refill: 1 - CBC with Differential/Platelet - Comprehensive metabolic panel - TSH  3. Anxiety state Anxiety controlled with using Zoloft 200 mg qd. Refill medications. No suicidal ideation and sleeping well. - sertraline (ZOLOFT) 100 MG tablet; Take 2 tablets (200 mg total) by mouth every morning.  Dispense: 60 tablet; Refill: 0  4. Chronic diastolic heart failure ([place]) Presently followed at the heart failure clinic by Darylene Price, FNP. Presently on Lanoxin 0.25 mg qd with Demadex 40 mg qd. No significant pedal edema today. Recheck labs. - CBC with Differential/Platelet - Comprehensive metabolic panel - Lipid Panel With LDL/HDL Ratio - TSH  5. COPD with hypoxia ([place]) Oxygen by nasal cannula at 2-3 LPM continuously keeping pulse oximetry at 96% today. Must continue Daliresp 500 mcg qd, Duoneb by nebulizer q 6 hrs prn dyspnea and Venntolin-HFA prn wheeze QID. Followed by pulmonologist (Dr. Raul Del). Recheck CBC for signs of infection. - CBC with Differential/Platelet  6. Spondylosis without myelopathy or radiculopathy, lumbosacral region Chronic low back pain at the pain management clinic (Dr. Dossie Arbour) and uses Gabapentin with Norco 5-325 mg. Continue routine follow up.   No follow-ups on file.      I, Earlena Werst, PA-C, have reviewed all documentation for this visit. The documentation on 06/10/21 for the exam, diagnosis, procedures, and orders are all accurate and complete.    [name], PA-C  [employer] [phone] (phone) [phone] (fax)  [place]

## 2021-06-11 LAB — CBC WITH DIFFERENTIAL/PLATELET
Basophils Absolute: 0.1 10*3/uL (ref 0.0–0.2)
Basos: 1 %
EOS (ABSOLUTE): 0.1 10*3/uL (ref 0.0–0.4)
Eos: 1 %
Hematocrit: 49.7 % — ABNORMAL HIGH (ref 34.0–46.6)
Hemoglobin: 16.4 g/dL — ABNORMAL HIGH (ref 11.1–15.9)
Immature Grans (Abs): 0.1 10*3/uL (ref 0.0–0.1)
Immature Granulocytes: 1 %
Lymphocytes Absolute: 1.4 10*3/uL (ref 0.7–3.1)
Lymphs: 10 %
MCH: 31.4 pg (ref 26.6–33.0)
MCHC: 33 g/dL (ref 31.5–35.7)
MCV: 95 fL (ref 79–97)
Monocytes Absolute: 0.7 10*3/uL (ref 0.1–0.9)
Monocytes: 5 %
Neutrophils Absolute: 12.1 10*3/uL — ABNORMAL HIGH (ref 1.4–7.0)
Neutrophils: 82 %
Platelets: 201 10*3/uL (ref 150–450)
RBC: 5.23 x10E6/uL (ref 3.77–5.28)
RDW: 14.3 % (ref 11.7–15.4)
WBC: 14.4 10*3/uL — ABNORMAL HIGH (ref 3.4–10.8)

## 2021-06-11 LAB — LIPID PANEL WITH LDL/HDL RATIO
Cholesterol, Total: 241 mg/dL — ABNORMAL HIGH (ref 100–199)
HDL: 36 mg/dL — ABNORMAL LOW (ref >39)
LDL Chol Calc (NIH): 171 mg/dL — ABNORMAL HIGH (ref 0–99)
LDL/HDL Ratio: 4.8 ratio — ABNORMAL HIGH (ref 0.0–3.2)
Triglycerides: 184 mg/dL — ABNORMAL HIGH (ref 0–149)
VLDL Cholesterol Cal: 34 mg/dL (ref 5–40)

## 2021-06-11 LAB — COMPREHENSIVE METABOLIC PANEL
ALT: 26 IU/L (ref 0–32)
AST: 26 IU/L (ref 0–40)
Albumin/Globulin Ratio: 2.1 (ref 1.2–2.2)
Albumin: 4.8 g/dL (ref 3.8–4.8)
Alkaline Phosphatase: 115 IU/L (ref 44–121)
BUN/Creatinine Ratio: 23 (ref 12–28)
BUN: 19 mg/dL (ref 8–27)
Bilirubin Total: 0.3 mg/dL (ref 0.0–1.2)
CO2: 25 mmol/L (ref 20–29)
Calcium: 9.8 mg/dL (ref 8.7–10.3)
Chloride: 99 mmol/L (ref 96–106)
Creatinine, Ser: 0.81 mg/dL (ref 0.57–1.00)
Globulin, Total: 2.3 g/dL (ref 1.5–4.5)
Glucose: 112 mg/dL — ABNORMAL HIGH (ref 65–99)
Potassium: 4.4 mmol/L (ref 3.5–5.2)
Sodium: 140 mmol/L (ref 134–144)
Total Protein: 7.1 g/dL (ref 6.0–8.5)
eGFR: 81 mL/min/{1.73_m2} (ref >59)

## 2021-06-11 LAB — TSH: TSH: 0.718 u[IU]/mL (ref 0.450–4.500)

## 2021-06-15 ENCOUNTER — Ambulatory Visit: Payer: Medicare HMO | Attending: Family | Admitting: Family

## 2021-06-15 ENCOUNTER — Encounter: Payer: Self-pay | Admitting: Family

## 2021-06-15 ENCOUNTER — Other Ambulatory Visit: Payer: Self-pay

## 2021-06-15 VITALS — BP 125/65 | HR 81 | Resp 18 | Ht 61.0 in | Wt 177.0 lb

## 2021-06-15 DIAGNOSIS — Z8249 Family history of ischemic heart disease and other diseases of the circulatory system: Secondary | ICD-10-CM | POA: Insufficient documentation

## 2021-06-15 DIAGNOSIS — J449 Chronic obstructive pulmonary disease, unspecified: Secondary | ICD-10-CM | POA: Diagnosis not present

## 2021-06-15 DIAGNOSIS — I10 Essential (primary) hypertension: Secondary | ICD-10-CM

## 2021-06-15 DIAGNOSIS — R0789 Other chest pain: Secondary | ICD-10-CM | POA: Insufficient documentation

## 2021-06-15 DIAGNOSIS — Z888 Allergy status to other drugs, medicaments and biological substances status: Secondary | ICD-10-CM | POA: Insufficient documentation

## 2021-06-15 DIAGNOSIS — R0602 Shortness of breath: Secondary | ICD-10-CM | POA: Insufficient documentation

## 2021-06-15 DIAGNOSIS — F17200 Nicotine dependence, unspecified, uncomplicated: Secondary | ICD-10-CM | POA: Diagnosis not present

## 2021-06-15 DIAGNOSIS — I110 Hypertensive heart disease with heart failure: Secondary | ICD-10-CM | POA: Diagnosis not present

## 2021-06-15 DIAGNOSIS — Z9981 Dependence on supplemental oxygen: Secondary | ICD-10-CM | POA: Diagnosis not present

## 2021-06-15 DIAGNOSIS — Z79899 Other long term (current) drug therapy: Secondary | ICD-10-CM | POA: Insufficient documentation

## 2021-06-15 DIAGNOSIS — I5032 Chronic diastolic (congestive) heart failure: Secondary | ICD-10-CM

## 2021-06-15 DIAGNOSIS — F419 Anxiety disorder, unspecified: Secondary | ICD-10-CM | POA: Diagnosis not present

## 2021-06-15 DIAGNOSIS — M549 Dorsalgia, unspecified: Secondary | ICD-10-CM | POA: Insufficient documentation

## 2021-06-15 DIAGNOSIS — F32A Depression, unspecified: Secondary | ICD-10-CM | POA: Diagnosis not present

## 2021-06-15 DIAGNOSIS — Z720 Tobacco use: Secondary | ICD-10-CM

## 2021-06-15 DIAGNOSIS — G8929 Other chronic pain: Secondary | ICD-10-CM | POA: Diagnosis not present

## 2021-06-15 DIAGNOSIS — Z7982 Long term (current) use of aspirin: Secondary | ICD-10-CM | POA: Diagnosis not present

## 2021-06-15 NOTE — Patient Instructions (Signed)
Continue weighing daily and call for an overnight weight gain of > 2 pounds or a weekly weight gain of >5 pounds.

## 2021-06-15 NOTE — Progress Notes (Signed)
Patient ID: Theresa [name], female    DOB: [birth year], 65 y.o.   MRN: [id number]  HPI Theresa [name] is a 65 y/o female with a history of chronic low back pain, HTN, depression, COPD on long-term oxygen, anxiety, carpal tunnel syndrome, long-standing tobacco use and chronic heart failure.  Echo report from 09/03/20 reviewed and showed an EF of >55% with mild LAE and severe TR. Echo report from 07/07/2019 reviewed and showed an EF of 60-65% along with mild TR and moderately elevated PA pressure. Echo done 11/03/15 but unable to access results. Echo done on 02/19/15 showed an EF of 50% without valvular stenosis.   Catheterization done 07/11/2019 showed:  Prox RCA to Dist RCA lesion is 50% stenosed. Mid LAD lesion is 50% stenosed. Ost LM to Dist LM lesion is 10% stenosed. Hemodynamic findings consistent with moderate pulmonary hypertension. Normal overall left ventricular function of at least 60% Moderate coronary disease no significant obstructive disease Moderate pulmonary hypertension  Was in the ED 02/04/21 due to acute on chronic HF. IV lasix given with good diuresis. D-dimer was elevated therefore she was sent for CT angio of the chest which was negative for PE or any other acute findings.    She presents today for a follow-up visit with a chief complaint of moderate shortness of breath with little exertion. She describes this as chronic in nature having been present for several years. She does note that her breathing gets worse in the hot/ humid weather so she tries to stay inside as much as possible. She has associated cough, fatigue, intermittent chest pain, anxiety, difficulty sleeping and chronic back pain along with this. She denies any abdominal distention, palpitations, pedal edema, wheezing, dizziness or weight gain.   She says that she continues to follow her low keto diet closely.   Past Medical History:  Diagnosis Date   Acute postoperative pain 10/03/2017   Anxiety    BiPAP (biphasic  positive airway pressure) dependence    at hs   Carpal tunnel syndrome    CHF (congestive heart failure) ([place])    1/18   CHF (congestive heart failure) ([facility])    Chronic generalized abdominal pain    Chronic rhinitis    COPD (chronic obstructive pulmonary disease) ([facility])    DDD (degenerative disc disease), cervical    DDD (degenerative disc disease), lumbosacral    Depression    Dyspnea    Edema    Flu    1/18   Gastritis    GERD (gastroesophageal reflux disease)    Hematuria    Hernia of abdominal wall 07/17/2018   Hernia, abdominal    Hypertension    Kidney stones    Low back pain    Lumbar radiculitis    Malodorous urine    Muscle weakness    Obesity    Oxygen dependent    Renal cyst    Sensory urge incontinence    Thyroid activity decreased    9/19   Tobacco abuse    Wheezing    Past Surgical History:  Procedure Laterality Date   ABDOMINAL HYSTERECTOMY     CARPAL TUNNEL RELEASE Left 2012   EPIGASTRIC HERNIA REPAIR N/A 08/13/2018   HERNIA REPAIR EPIGASTRIC ADULT; polypropylene mesh reinforcement.  Surgeon: Robert Bellow, MD;  Location: [facility];  Service: General;  Laterality: N/A;   RIGHT/LEFT HEART CATH AND CORONARY ANGIOGRAPHY N/A 07/11/2019   Procedure: RIGHT/LEFT HEART CATH AND CORONARY ANGIOGRAPHY;  Surgeon: Yolonda Kida, MD;  Location: [facility]  INVASIVE [facility];  Service: Cardiovascular;  Laterality: N/A;   TONSILLECTOMY     TUBAL LIGATION     UMBILICAL HERNIA REPAIR N/A 08/13/2018   HERNIA REPAIR UMBILICAL ADULT; polypropylene mesh reinforcement Surgeon: Robert Bellow, MD;  Location: [facility];  Service: General;  Laterality: N/A;   Family History  Problem Relation Age of Onset   Heart disease Mother    Stroke Mother    Coronary artery disease Mother    Lung cancer Sister    Cancer Sister    Breast cancer Sister    Alcohol abuse Father    Heart disease Father    Cancer Brother    Cancer Brother    Pneumonia Brother    Prostate cancer Neg  Hx    Kidney cancer Neg Hx    Bladder Cancer Neg Hx    Social History   Tobacco Use   Smoking status: Every Day    Packs/day: 0.50    Years: 44.00    Pack years: 22.00    Types: Cigarettes   Smokeless tobacco: Never   Tobacco comments:    over a half pack  Substance Use Topics   Alcohol use: No   Allergies  Allergen Reactions   Codeine Nausea And Vomiting   Meloxicam Other (See Comments)    Stomach pain   Prior to Admission medications   Medication Sig Start Date End Date Taking? Authorizing Provider  albuterol (VENTOLIN HFA) 108 (90 Base) MCG/ACT inhaler Inhale 2 puffs into the lungs every 6 (six) hours as needed for wheezing or shortness of breath. 10/23/19  Yes Alfred Levins, [place], MD  aspirin EC 81 MG tablet Take 81 mg by mouth daily.   Yes [provider]  Cholecalciferol 50 MCG (2000 UT) CAPS Take 2,000 Units by mouth daily.    Yes [provider]  diazepam (VALIUM) 10 MG tablet Take 1 tablet (10 mg total) by mouth 60 (sixty) minutes before procedure for 1 dose. Take with a sip of water, on an empty stomach. Do not eat anything for 6 hours prior to procedure. 05/05/21  Yes Milinda Pointer, MD  digoxin (LANOXIN) 0.25 MG tablet TAKE 1 TABLET(0.25 MG) BY MOUTH DAILY 05/12/21  Yes Chrismon, Vickki Muff, PA-C  Fluticasone-Umeclidin-Vilant 100-62.5-25 MCG/INH AEPB Inhale 1 puff into the lungs daily. 10/03/18  Yes [provider]  gabapentin (NEURONTIN) 400 MG capsule Take 1 capsule (400 mg total) by mouth 3 (three) times daily. 07/11/20  Yes Milinda Pointer, MD  HYDROcodone-acetaminophen (NORCO/VICODIN) 5-325 MG tablet Take 1 tablet by mouth every 8 (eight) hours as needed for severe pain. Must last 30 days 06/04/21 07/04/21 Yes Milinda Pointer, MD  ipratropium-albuterol (DUONEB) 0.5-2.5 (3) MG/3ML SOLN USE 1 VIAL VIA NEBULIZER EVERY 6 HOURS AS NEEDED FOR SHORTNESS OF BREATH OR WHEEZING 11/30/20  Yes Chrismon, Vickki Muff, PA-C  levothyroxine (SYNTHROID) 25 MCG  tablet TAKE 1 TABLET(25 MCG) BY MOUTH DAILY BEFORE BREAKFAST 06/10/21  Yes Chrismon, Vickki Muff, PA-C  OXYGEN Inhale 2.5 L/min into the lungs.   Yes [provider]  potassium chloride SA (KLOR-CON) 20 MEQ tablet TAKE 1 TABLET(20 MEQ) BY MOUTH TWICE DAILY 11/26/20  Yes Edwin Cherian A, FNP  Roflumilast (DALIRESP) 250 MCG TABS Take 500 mcg by mouth daily as needed.   Yes [provider]  sertraline (ZOLOFT) 100 MG tablet Take 2 tablets (200 mg total) by mouth every morning. 06/10/21  Yes Chrismon, Vickki Muff, PA-C  tolterodine (DETROL LA) 2 MG 24 hr capsule Take 1  capsule (2 mg total) by mouth daily. 05/19/21 06/18/21 Yes Harlin Heys, MD  torsemide (DEMADEX) 20 MG tablet Take 2 tablets (40 mg total) by mouth 2 (two) times daily. 05/21/20  Yes Braidon Chermak, Otila Kluver A, FNP  HYDROcodone-acetaminophen (NORCO/VICODIN) 5-325 MG tablet Take 1 tablet by mouth every 8 (eight) hours as needed for severe pain. Must last 30 days 05/05/21 06/04/21  Milinda Pointer, MD  HYDROcodone-acetaminophen (NORCO/VICODIN) 5-325 MG tablet Take 1 tablet by mouth every 8 (eight) hours as needed for severe pain. Must last 30 days Patient not taking: Reported on 06/15/2021 07/04/21 08/03/21  Milinda Pointer, MD    Review of Systems  Constitutional:  Positive for fatigue. Negative for appetite change.  HENT:  Negative for congestion, postnasal drip and sore throat.   Eyes: Negative.   Respiratory:  Positive for cough and shortness of breath. Negative for chest tightness and wheezing.   Cardiovascular:  Positive for chest pain (intermittent at times). Negative for palpitations and leg swelling.  Gastrointestinal:  Negative for abdominal distention, abdominal pain and nausea.  Endocrine: Negative.   Genitourinary: Negative.   Musculoskeletal:  Positive for back pain. Negative for neck pain.  Skin: Negative.   Allergic/Immunologic: Negative.   Neurological:  Negative for dizziness and light-headedness.  Hematological:   Negative for adenopathy. Does not bruise/bleed easily.  Psychiatric/Behavioral:  Positive for sleep disturbance (wearing oxygen & bipap at night). Negative for agitation, dysphoric mood and suicidal ideas. The patient is nervous/anxious.    Vitals:   06/15/21 1107  BP: 125/65  Pulse: 81  Resp: 18  SpO2: 94%  Weight: 177 lb (80.3 kg)  Height: _0  (1.549 m)   Wt Readings from Last 3 Encounters:  06/15/21 177 lb (80.3 kg)  06/10/21 180 lb (81.6 kg)  05/19/21 180 lb (81.6 kg)   Lab Results  Component Value Date   CREATININE 0.81 06/10/2021   CREATININE 0.80 02/04/2021   CREATININE 0.84 06/26/2020    Physical Exam Vitals and nursing note reviewed.  Constitutional:      Appearance: She is well-developed.  HENT:     Head: Normocephalic and atraumatic.  Neck:     Vascular: No JVD.  Cardiovascular:     Rate and Rhythm: Normal rate and regular rhythm.  Pulmonary:     Effort: Pulmonary effort is normal.     Breath sounds: No wheezing, rhonchi or rales.  Abdominal:     General: There is no distension.     Palpations: Abdomen is soft.     Tenderness: There is no abdominal tenderness.  Musculoskeletal:        General: No tenderness.     Cervical back: Normal range of motion and neck supple.  Skin:    General: Skin is warm and dry.  Neurological:     Mental Status: She is alert and oriented to person, place, and time.  Psychiatric:        Mood and Affect: Mood normal. Mood is not depressed.        Behavior: Behavior normal.        Thought Content: Thought content normal.     Assessment & Plan:  1: Chronic heart failure with preserved ejection fraction without structural changes- - NYHA class III - euvolemic today - continues to weigh daily at home. Reminded to call for an overnight weight gain of >2 pounds or a weekly weight gain of >5 pounds - weight down 4 pounds from last visit here 4 months ago - has continued to  lose weight due to following a keto diet and reading  food labels  - had telemedicine visit with cardiologist Clayborn Bigness) 11/30/20  - BNP 02/04/21 was 59.7  2: HTN- - BP looks good today - had video visit with PCP (Chrismon) 06/10/21 - BMP from 06/10/21 reviewed and showed sodium 140, potassium 4.4, creatinine 0.81 and GFR 81  3: COPD- - wearing bipap nightly - oxygen at 3L mostly around the clock right now - last saw pulmonology Raul Del) 06/01/21  4: Tobacco use- - alternates between the lozenges, gum and actual cigarettes - removes herself from the oxygen when smoking - complete cessation discussed for 3 minutes with her   Patient did not bring her medications nor a list. Each medication was verbally reviewed with the patient and she was encouraged to bring the bottles to every visit to confirm accuracy of list.  Return in 6 months or sooner for any questions/problems before then.

## 2021-06-22 ENCOUNTER — Telehealth: Payer: Self-pay

## 2021-06-22 ENCOUNTER — Other Ambulatory Visit: Payer: Self-pay | Admitting: Family Medicine

## 2021-06-22 DIAGNOSIS — J9610 Chronic respiratory failure, unspecified whether with hypoxia or hypercapnia: Secondary | ICD-10-CM | POA: Diagnosis not present

## 2021-06-22 DIAGNOSIS — D72829 Elevated white blood cell count, unspecified: Secondary | ICD-10-CM

## 2021-06-22 MED ORDER — SIMVASTATIN 20 MG PO TABS: 20.0000 mg | ORAL_TABLET | Freq: Every day | ORAL | 3 refills | Status: DC

## 2021-06-22 MED ORDER — DOXYCYCLINE HYCLATE 100 MG PO TABS: 100.0000 mg | ORAL_TABLET | Freq: Two times a day (BID) | ORAL | 0 refills | Status: DC

## 2021-06-22 NOTE — Telephone Encounter (Signed)
-----  Message from Margo Common, PA-C sent at 06/21/2021 10:52 PM EDT ----- Final report of labs show WBC count elevated with high neutrophil count indicating some infection. Recommend Doxycycline 100 mg BID #20 for suspected exacerbation of COPD. Should recheck with Dr. Raul Del soon and repeat CBC in 10-14 days to assess progression. Total cholesterol, triglycerides and LDL cholesterol high. Need Simvastatin 20 mg qd #90 & 3 RF to reduce levels and risk for cardiovascular event. Recheck lipids in 3-4 months.

## 2021-06-22 NOTE — Telephone Encounter (Signed)
Left future order in chart to be released when she comes by for recheck of WBC count after finishing the antibiotic.

## 2021-06-22 NOTE — Telephone Encounter (Signed)
Patient advised.

## 2021-06-23 ENCOUNTER — Emergency Department
Admission: EM | Admit: 2021-06-23 | Discharge: 2021-06-23 | Disposition: A | Discharge status: Home / Self Care | Payer: Medicare HMO | Attending: Emergency Medicine | Admitting: Emergency Medicine

## 2021-06-23 ENCOUNTER — Emergency Department: Payer: Medicare HMO

## 2021-06-23 ENCOUNTER — Encounter: Payer: Self-pay | Admitting: Emergency Medicine

## 2021-06-23 ENCOUNTER — Other Ambulatory Visit: Payer: Self-pay

## 2021-06-23 DIAGNOSIS — R911 Solitary pulmonary nodule: Secondary | ICD-10-CM | POA: Diagnosis not present

## 2021-06-23 DIAGNOSIS — M546 Pain in thoracic spine: Secondary | ICD-10-CM | POA: Insufficient documentation

## 2021-06-23 DIAGNOSIS — Z79899 Other long term (current) drug therapy: Secondary | ICD-10-CM | POA: Insufficient documentation

## 2021-06-23 DIAGNOSIS — R059 Cough, unspecified: Secondary | ICD-10-CM | POA: Diagnosis not present

## 2021-06-23 DIAGNOSIS — F17210 Nicotine dependence, cigarettes, uncomplicated: Secondary | ICD-10-CM | POA: Diagnosis not present

## 2021-06-23 DIAGNOSIS — Z8616 Personal history of COVID-19: Secondary | ICD-10-CM | POA: Insufficient documentation

## 2021-06-23 DIAGNOSIS — R0600 Dyspnea, unspecified: Secondary | ICD-10-CM

## 2021-06-23 DIAGNOSIS — R0602 Shortness of breath: Secondary | ICD-10-CM | POA: Diagnosis not present

## 2021-06-23 DIAGNOSIS — I5032 Chronic diastolic (congestive) heart failure: Secondary | ICD-10-CM | POA: Insufficient documentation

## 2021-06-23 DIAGNOSIS — Z7951 Long term (current) use of inhaled steroids: Secondary | ICD-10-CM | POA: Insufficient documentation

## 2021-06-23 DIAGNOSIS — I110 Hypertensive heart disease with heart failure: Secondary | ICD-10-CM | POA: Diagnosis not present

## 2021-06-23 DIAGNOSIS — J449 Chronic obstructive pulmonary disease, unspecified: Secondary | ICD-10-CM | POA: Insufficient documentation

## 2021-06-23 DIAGNOSIS — Z7982 Long term (current) use of aspirin: Secondary | ICD-10-CM | POA: Diagnosis not present

## 2021-06-23 DIAGNOSIS — J9811 Atelectasis: Secondary | ICD-10-CM | POA: Diagnosis not present

## 2021-06-23 LAB — BASIC METABOLIC PANEL
Anion gap: 11 (ref 5–15)
BUN: 16 mg/dL (ref 8–23)
CO2: 27 mmol/L (ref 22–32)
Calcium: 9.2 mg/dL (ref 8.9–10.3)
Chloride: 99 mmol/L (ref 98–111)
Creatinine, Ser: 0.64 mg/dL (ref 0.44–1.00)
GFR, Estimated: >60 mL/min (ref >60)
Glucose, Bld: 106 mg/dL — ABNORMAL HIGH (ref 70–99)
Potassium: 3.4 mmol/L — ABNORMAL LOW (ref 3.5–5.1)
Sodium: 137 mmol/L (ref 135–145)

## 2021-06-23 LAB — CBC
HCT: 50.5 % — ABNORMAL HIGH (ref 36.0–46.0)
Hemoglobin: 17.8 g/dL — ABNORMAL HIGH (ref 12.0–15.0)
MCH: 33.2 pg (ref 26.0–34.0)
MCHC: 35.2 g/dL (ref 30.0–36.0)
MCV: 94.2 fL (ref 80.0–100.0)
Platelets: 209 10*3/uL (ref 150–400)
RBC: 5.36 MIL/uL — ABNORMAL HIGH (ref 3.87–5.11)
RDW: 15.4 % (ref 11.5–15.5)
WBC: 15 10*3/uL — ABNORMAL HIGH (ref 4.0–10.5)
nRBC: 0 % (ref 0.0–0.2)

## 2021-06-23 LAB — D-DIMER, QUANTITATIVE: D-Dimer, Quant: 1.44 ug/mL-FEU — ABNORMAL HIGH (ref 0.00–0.50)

## 2021-06-23 LAB — TROPONIN I (HIGH SENSITIVITY)
Troponin I (High Sensitivity): 13 ng/L (ref <18)
Troponin I (High Sensitivity): 12 ng/L (ref <18)

## 2021-06-23 MED ORDER — PREDNISONE 20 MG PO TABS
60.0000 mg | ORAL_TABLET | Freq: Once | ORAL | Status: AC
  Administered 2021-06-23: 60 mg via ORAL
  Filled 2021-06-23: qty 3

## 2021-06-23 MED ORDER — PREDNISONE 50 MG PO TABS: ORAL_TABLET | ORAL | 0 refills | Status: DC

## 2021-06-23 MED ORDER — IOHEXOL 350 MG/ML SOLN
75.0000 mL | Freq: Once | INTRAVENOUS | Status: AC | PRN
  Administered 2021-06-23: 75 mL via INTRAVENOUS

## 2021-06-23 MED ORDER — IPRATROPIUM-ALBUTEROL 0.5-2.5 (3) MG/3ML IN SOLN
3.0000 mL | Freq: Once | RESPIRATORY_TRACT | Status: AC
  Administered 2021-06-23: 3 mL via RESPIRATORY_TRACT
  Filled 2021-06-23: qty 3

## 2021-06-23 NOTE — ED Triage Notes (Signed)
First Nurse Note:  Arrives c/o 3 day history of SOB and right thoracic back pain. Wears 2.5L home oxygen.  AAOx3.  Skin warm and dry. NAD

## 2021-06-23 NOTE — Discharge Instructions (Addendum)
Your blood work and CT scan were reassuring today.  Please follow-up with your primary care physician within the next several days.  You should take the prednisone for the next 5 days for COPD.

## 2021-06-23 NOTE — ED Triage Notes (Signed)
Pt reports that for the last 3 days she has had increased SHOB and right "lung" pain posteriorly. Hurts worse when she takes a deep breath at times and other times she has a stabbing pain that takes her breath. She is able to speak in complete sentences. She does wear 2.5 L [place] at home.

## 2021-06-24 ENCOUNTER — Telehealth: Payer: Self-pay | Admitting: Pain Medicine

## 2021-06-24 NOTE — ED Provider Notes (Signed)
[facility]  ____________________________________________   Event Date/Time   First MD Initiated Contact with Patient 06/23/21 1906     (approximate)  I have reviewed the triage vital signs and the nursing notes.   HISTORY  Chief Complaint Shortness of Breath    HPI Theresa [name] is a 65 y.o. female with past medical history of COPD on 2.5 L nasal cannula, CHF with preserved ejection fraction, hypertension, OSA on BiPAP nightly who presents with dyspnea.  Symptoms of been going on for the past 2 to 3 days.  She endorses dyspnea on exertion and mild cough.  Also endorses pain in the right upper back that is constant, nonexertional not associated with diaphoresis nausea or vomiting.  Has had this pain in the past as well.  She denies fevers or chills.  Denies history of DVT/PE, lower extremity swelling, and surgeries, recent long travel or estrogen use.  Patient has been using her home inhalers.         Past Medical History:  Diagnosis Date   Acute postoperative pain 10/03/2017   Anxiety    BiPAP (biphasic positive airway pressure) dependence    at hs   Carpal tunnel syndrome    CHF (congestive heart failure) ([employer])    1/18   CHF (congestive heart failure) (HCC)    Chronic generalized abdominal pain    Chronic rhinitis    COPD (chronic obstructive pulmonary disease) (HCC)    DDD (degenerative disc disease), cervical    DDD (degenerative disc disease), lumbosacral    Depression    Dyspnea    Edema    Flu    1/18   Gastritis    GERD (gastroesophageal reflux disease)    Hematuria    Hernia of abdominal wall 07/17/2018   Hernia, abdominal    Hypertension    Kidney stones    Low back pain    Lumbar radiculitis    Malodorous urine    Muscle weakness    Obesity    Oxygen dependent    Renal cyst    Sensory urge incontinence    Thyroid activity decreased    9/19   Tobacco abuse    Wheezing     Patient Active Problem List   Diagnosis  Date Noted   Chronic shoulder pain (Bilateral) 05/05/2021   Chronic use of opiate for therapeutic purpose 01/26/2021   Left thigh pain 69/62/9528   Uncomplicated opioid dependence ([place]) 04/08/2020   Pharmacologic therapy 04/08/2020   Pneumonia due to COVID-19 virus 02/03/2020   Numbness and tingling in right hand 12/31/2019   Numbness in feet 12/31/2019   Altered consciousness 11/26/2019   Frequent falls 11/26/2019   Weakness of both lower extremities 11/26/2019   Lumbar disc extrusion (L2-3) (Left) 09/10/2019   Chronic knee pain (Left) 08/14/2019   Chronic left-sided lumbar radiculopathy 08/14/2019   Abnormal MRI, lumbar spine (07/23/2019) 08/14/2019   Acute on chronic respiratory failure with hypoxia ([place]) 07/06/2019   Cervicalgia (Bilateral) (R>L) 09/24/2018   Spondylosis without myelopathy or radiculopathy, lumbosacral region 09/24/2018   Epigastric hernia 05/04/2018   Chronic lower extremity pain (Bilateral) (L>R) 01/25/2018   Altered mental status, unspecified 12/09/2017   Chronic hip pain (3ry area of Pain) (Bilateral) (L>R) 11/27/2017   COPD (chronic obstructive pulmonary disease) ([place]) 08/11/2017   Other long term (current) drug therapy 08/08/2017   Disorder of bone, unspecified 08/08/2017   COPD with hypoxia ([place]) 06/27/2017   Arthralgia of acromioclavicular joint (Right) 06/15/2017   Acromioclavicular  joint DJD (Right) 06/15/2017   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   Chronic shoulder pain (Left) 05/11/2017   Elevated brain natriuretic peptide (BNP) level 05/11/2017   Grief at loss of child 02/14/2017   Lumbar facet hypertrophy (multilevel) 11/17/2016   Lumbar spinal stenosis (L4-5) 11/17/2016   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) 11/17/2016   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) 11/17/2016   Lumbosacral radiculopathy at L4 11/17/2016   Supplemental oxygen dependent 08/01/2016   Sleep apnea 06/02/2016   Long term current use  of opiate analgesic 01/12/2016   Long term prescription opiate use 01/12/2016   Opiate use (15 MME/Day) 01/12/2016   Lumbar facet syndrome (Bilateral) (L>R) 01/12/2016   Chronic sacroiliac joint pain (Bilateral) 01/12/2016   Vitamin D deficiency 12/30/2015   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) 12/28/2015   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   Coagulation disorder ([place]) 10/22/2015   Chronic pain syndrome (significant psychosocial component) 09/21/2015   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 09/21/2015   Encounter for therapeutic drug level monitoring 09/16/2015   Encounter for chronic pain management 09/16/2015   Pain management 09/16/2015   Neurogenic pain 09/16/2015   Neuropathic pain 09/16/2015   Musculoskeletal pain 09/16/2015   Lumbar spondylosis (L4-5) 09/16/2015   Chronic low back pain (1ry area of Pain) (Bilateral) (L>R) w/o sciatica 09/16/2015   Chronic lower extremity pain (2ry area of Pain) (Left) 09/16/2015   Chronic lumbar radicular pain (L4 & S1 Dermatome) (Left) 09/16/2015   Chronic shoulder pain (Right side) 09/16/2015   Chronic upper extremity pain (Right-sided) 09/16/2015   Cervical radiculitis (Right side) 09/16/2015   Abnormal x-ray of lumbar spine 09/16/2015   Chronic diastolic heart failure ([place]) 07/15/2015   Chest pain 07/15/2015   Tobacco abuse 07/15/2015   Tendinitis 04/21/2015   Blood clotting disorder ([place]) 04/21/2015   Decreased motor strength 07/16/2014   Clinical depression 07/16/2014   Chronic rhinitis 07/16/2014   Carpal tunnel syndrome 07/16/2014   Anxiety state 07/16/2014   Nausea without vomiting 07/16/2014   Edema 07/16/2014   Generalized muscle weakness 07/16/2014   Neuritis or radiculitis due to rupture of lumbar intervertebral disc 07/02/2014   DDD (degenerative disc disease), lumbar 07/02/2014   CAFL (chronic airflow limitation) ([place]) 02/27/2014   Herpes zoster 07/16/2009   Essential  (primary) hypertension 03/27/2008    Past Surgical History:  Procedure Laterality Date   ABDOMINAL HYSTERECTOMY     CARPAL TUNNEL RELEASE Left 2012   EPIGASTRIC HERNIA REPAIR N/A 08/13/2018   HERNIA REPAIR EPIGASTRIC ADULT; polypropylene mesh reinforcement.  Surgeon: Robert Bellow, MD;  Location: [facility] ORS;  Service: General;  Laterality: N/A;   RIGHT/LEFT HEART CATH AND CORONARY ANGIOGRAPHY N/A 07/11/2019   Procedure: RIGHT/LEFT HEART CATH AND CORONARY ANGIOGRAPHY;  Surgeon: Yolonda Kida, MD;  Location: [facility];  Service: Cardiovascular;  Laterality: N/A;   TONSILLECTOMY     TUBAL LIGATION     UMBILICAL HERNIA REPAIR N/A 08/13/2018   HERNIA REPAIR UMBILICAL ADULT; polypropylene mesh reinforcement Surgeon: Robert Bellow, MD;  Location: [facility];  Service: General;  Laterality: N/A;    Prior to Admission medications   Medication Sig Start Date End Date Taking? Authorizing Provider  predniSONE (DELTASONE) 50 MG tablet Take 1 pill, 72m daily for 5 days 06/23/21  Yes MRada Hay MD  albuterol (VENTOLIN HFA) 108 (90 Base) MCG/ACT inhaler Inhale 2 puffs into the lungs every 6 (six) hours as needed for wheezing or shortness  of breath. 10/23/19   Rudene Re, MD  aspirin EC 81 MG tablet Take 81 mg by mouth daily.    [provider]  Cholecalciferol 50 MCG (2000 UT) CAPS Take 2,000 Units by mouth daily.     [provider]  diazepam (VALIUM) 10 MG tablet Take 1 tablet (10 mg total) by mouth 60 (sixty) minutes before procedure for 1 dose. Take with a sip of water, on an empty stomach. Do not eat anything for 6 hours prior to procedure. 05/05/21   Milinda Pointer, MD  digoxin (LANOXIN) 0.25 MG tablet TAKE 1 TABLET(0.25 MG) BY MOUTH DAILY 05/12/21   Chrismon, Vickki Muff, PA-C  doxycycline (VIBRA-TABS) 100 MG tablet Take 1 tablet (100 mg total) by mouth 2 (two) times daily. 06/22/21   Chrismon, Vickki Muff, PA-C  Fluticasone-Umeclidin-Vilant 100-62.5-25  MCG/INH AEPB Inhale 1 puff into the lungs daily. 10/03/18   [provider]  gabapentin (NEURONTIN) 400 MG capsule Take 1 capsule (400 mg total) by mouth 3 (three) times daily. 07/11/20   Milinda Pointer, MD  HYDROcodone-acetaminophen (NORCO/VICODIN) 5-325 MG tablet Take 1 tablet by mouth every 8 (eight) hours as needed for severe pain. Must last 30 days 05/05/21 06/04/21  Milinda Pointer, MD  HYDROcodone-acetaminophen (NORCO/VICODIN) 5-325 MG tablet Take 1 tablet by mouth every 8 (eight) hours as needed for severe pain. Must last 30 days 06/04/21 07/04/21  Milinda Pointer, MD  HYDROcodone-acetaminophen (NORCO/VICODIN) 5-325 MG tablet Take 1 tablet by mouth every 8 (eight) hours as needed for severe pain. Must last 30 days Patient not taking: Reported on 06/15/2021 07/04/21 08/03/21  Milinda Pointer, MD  ipratropium-albuterol (DUONEB) 0.5-2.5 (3) MG/3ML SOLN USE 1 VIAL VIA NEBULIZER EVERY 6 HOURS AS NEEDED FOR SHORTNESS OF BREATH OR WHEEZING 11/30/20   Chrismon, Vickki Muff, PA-C  levothyroxine (SYNTHROID) 25 MCG tablet TAKE 1 TABLET(25 MCG) BY MOUTH DAILY BEFORE BREAKFAST 06/10/21   Chrismon, Vickki Muff, PA-C  OXYGEN Inhale 2.5 L/min into the lungs.    [provider]  potassium chloride SA (KLOR-CON) 20 MEQ tablet TAKE 1 TABLET(20 MEQ) BY MOUTH TWICE DAILY 11/26/20   Darylene Price A, FNP  Roflumilast (DALIRESP) 250 MCG TABS Take 500 mcg by mouth daily as needed.    [provider]  sertraline (ZOLOFT) 100 MG tablet Take 2 tablets (200 mg total) by mouth every morning. 06/10/21   Chrismon, Vickki Muff, PA-C  simvastatin (ZOCOR) 20 MG tablet Take 1 tablet (20 mg total) by mouth at bedtime. 06/22/21   Chrismon, Vickki Muff, PA-C  torsemide (DEMADEX) 20 MG tablet Take 2 tablets (40 mg total) by mouth 2 (two) times daily. 05/21/20   [name], FNP    Allergies Codeine and Meloxicam  Family History  Problem Relation Age of Onset   Heart disease Mother    Stroke Mother     Coronary artery disease Mother    Lung cancer Sister    Cancer Sister    Breast cancer Sister    Alcohol abuse Father    Heart disease Father    Cancer Brother    Cancer Brother    Pneumonia Brother    Prostate cancer Neg Hx    Kidney cancer Neg Hx    Bladder Cancer Neg Hx     Social History Social History   Tobacco Use   Smoking status: Every Day    Packs/day: 0.50    Years: 44.00    Pack years: 22.00    Types: Cigarettes   Smokeless tobacco:  Never   Tobacco comments:    over a half pack  Vaping Use   Vaping Use: Never used  Substance Use Topics   Alcohol use: No   Drug use: No    Review of Systems   Review of Systems  Constitutional:  Negative for chills and fever.  Respiratory:  Positive for cough and shortness of breath. Negative for chest tightness.   Cardiovascular:  Negative for chest pain, palpitations and leg swelling.  Gastrointestinal:  Negative for abdominal pain, nausea and vomiting.  Musculoskeletal:  Positive for arthralgias and back pain.  All other systems reviewed and are negative.  Physical Exam Updated Vital Signs BP 125/85 (BP Location: Left Arm)   Pulse 85   Temp 98 F (36.7 C) (Oral)   Resp (!) 24   Ht _0  (1.575 m)   Wt 79.4 kg   SpO2 92%   BMI 32.01 kg/m   Physical Exam Vitals and nursing note reviewed.  Constitutional:      General: She is not in acute distress.    Appearance: Normal appearance.  HENT:     Head: Normocephalic and atraumatic.  Eyes:     General: No scleral icterus.    Conjunctiva/sclera: Conjunctivae normal.  Pulmonary:     Effort: Pulmonary effort is normal. No respiratory distress.     Breath sounds: No stridor.  Abdominal:     Palpations: Abdomen is soft.     Tenderness: There is no guarding or rebound.  Musculoskeletal:        General: No deformity or signs of injury. Normal range of motion.     Cervical back: Normal range of motion.     Right lower leg: No edema.     Left lower leg: No edema.   Skin:    General: Skin is dry.     Coloration: Skin is not jaundiced or pale.  Neurological:     General: No focal deficit present.     Mental Status: She is alert and oriented to person, place, and time. Mental status is at baseline.  Psychiatric:        Mood and Affect: Mood normal.        Behavior: Behavior normal.     LABS (all labs ordered are listed, but only abnormal results are displayed)  Labs Reviewed  BASIC METABOLIC PANEL - Abnormal; Notable for the following components:      Result Value   Potassium 3.4 (*)    Glucose, Bld 106 (*)    All other components within normal limits  CBC - Abnormal; Notable for the following components:   WBC 15.0 (*)    RBC 5.36 (*)    Hemoglobin 17.8 (*)    HCT 50.5 (*)    All other components within normal limits  D-DIMER, QUANTITATIVE - Abnormal; Notable for the following components:   D-Dimer, Quant 1.44 (*)    All other components within normal limits  TROPONIN I (HIGH SENSITIVITY)  TROPONIN I (HIGH SENSITIVITY)   ____________________________________________  EKG  Sinus tachycardia, normal intervals, ST depression diffusely, mild elevation in aVR and V1, similar to prior EKG in 01/2021 ____________________________________________  RADIOLOGY I, Madelin Headings, personally viewed and evaluated these images (plain radiographs) as part of my medical decision making, as well as reviewing the written report by the radiologist.  ED MD interpretation: I reviewed the chest x-ray which is negative for acute cardiopulmonary process  I reviewed the CT angio of the chest which is negative for  pulmonary embolism  ____________________________________________   PROCEDURES  Procedure(s) performed (including Critical Care):  Procedures   ____________________________________________   INITIAL IMPRESSION / ASSESSMENT AND PLAN / ED COURSE     65 year old female with COPD on oxygen at baseline presents with dyspnea.  Her vital  signs are within normal limits.  She is satting well on her home 2.5 L.  On exam she appears comfortable is not in any respiratory distress.  Her lungs are clear.  She has no signs of volume overload or DVT on exam.  Her initial EKG does show some ST depressions in the inferior and lateral leads as well as  mild elevation in aVR and V1 however on review of prior EKGs the morphology in aVR and V1 is very similar to prior and these depressions existed previously as well although somewhat more pronounced today.  Patient has some right upper back pain but it.  does not sound like typical ischemic pain, it is sharp, nonexertional and pleuritic.  Her chest x-ray does not show any infiltrate.  Her serial her serial troponins were negative.  Her serial troponins were negative.  D-dimer was also obtained given her dyspnea without obvious wheezing on exam.  D-dimer was elevated at 1.4 so CT angio was obtained which shows findings consistent with COPD but no pulmonary embolism.  Patient did receive DuoNeb in the ED with minimal improvement in her symptoms.  On reevaluation she feels somewhat improved.  I explained to her the results of her studies today and she is comfortable with discharge.  We will give a 5-day course of steroids as her COPD is likely contributing to her symptoms.  We discussed return precautions for worsening dyspnea or new chest pain.      ____________________________________________   FINAL CLINICAL IMPRESSION(S) / ED DIAGNOSES  Final diagnoses:  Dyspnea, unspecified type     ED Discharge Orders          Ordered    predniSONE (DELTASONE) 50 MG tablet        06/23/21 2148             Note:  This document was prepared using Dragon voice recognition software and may include unintentional dictation errors.    Rada Hay, MD 06/24/21 [phone]

## 2021-06-24 NOTE — Telephone Encounter (Signed)
Spoke with patient.  She states she just got out of the hospital with pneumonia and they started her on 10 day course of antibiotics and steroids.  Procedure cancelled for 9-13.  States she will call back when finished with antibiotics and feeling better.

## 2021-06-25 ENCOUNTER — Other Ambulatory Visit: Payer: Self-pay | Admitting: Obstetrics and Gynecology

## 2021-06-25 ENCOUNTER — Other Ambulatory Visit: Payer: Self-pay | Admitting: Family Medicine

## 2021-06-25 DIAGNOSIS — N3941 Urge incontinence: Secondary | ICD-10-CM

## 2021-06-25 DIAGNOSIS — F411 Generalized anxiety disorder: Secondary | ICD-10-CM

## 2021-06-25 NOTE — Telephone Encounter (Signed)
Requested Prescriptions  Pending Prescriptions Disp Refills  . sertraline (ZOLOFT) 100 MG tablet [Pharmacy Med Name: SERTRALINE HCL 100 MG TAB] 60 tablet 0    Sig: TAKE 2 TABLETS BY MOUTH ONCE EVERY MORNING *NEED APPOINTMENT FOR FURTHER FILLS*     Psychiatry:  Antidepressants - SSRI Passed - 06/25/2021 11:43 AM      Passed - Completed PHQ-2 or PHQ-9 in the last 360 days      Passed - Valid encounter within last 6 months    Recent Outpatient Visits          2 weeks ago Essential (primary) hypertension   [place], PA-C   9 months ago COPD exacerbation [facility])   [place], PA-C   11 months ago Need for influenza vaccination   [place], PA-C   12 months ago Chronic obstructive pulmonary disease with acute exacerbation [name])   [place], Wendee Beavers, PA-C   1 year ago Essential (primary) hypertension   [employer], Vickki Muff, PA-C      Future Appointments            In 1 week Amalia Hailey, Nyoka Lint, MD [facility]   In 4 months Fisher, Kirstie Peri, MD [facility], [place]

## 2021-06-29 ENCOUNTER — Ambulatory Visit: Payer: Medicare HMO | Admitting: Pain Medicine

## 2021-06-30 ENCOUNTER — Other Ambulatory Visit: Payer: Self-pay | Admitting: Family Medicine

## 2021-06-30 DIAGNOSIS — F411 Generalized anxiety disorder: Secondary | ICD-10-CM

## 2021-06-30 NOTE — Telephone Encounter (Signed)
Requested medication (s) are due for refill today - no  Requested medication (s) are on the active medication list -yes  Future visit scheduled -yes  Last refill: 06/25/21  Notes to clinic: Attempted to call patient x2- left message-need preferred pharmacy information- Rx refill was sent to [place]- this request is from [facility]  Requested Prescriptions  Pending Prescriptions Disp Refills   sertraline (ZOLOFT) 100 MG tablet [Pharmacy Med Name: SERTRALINE 100MG TABLETS] 60 tablet     Sig: TAKE 2 TABLETS BY [place]     Psychiatry:  Antidepressants - SSRI Passed - 06/30/2021 10:00 AM      Passed - Completed PHQ-2 or PHQ-9 in the last 360 days      Passed - Valid encounter within last 6 months    Recent Outpatient Visits           2 weeks ago Essential (primary) hypertension   [place], PA-C   9 months ago COPD exacerbation [facility])   [place], PA-C   12 months ago Need for influenza vaccination   [place], PA-C   1 year ago Chronic obstructive pulmonary disease with acute exacerbation ([place])   [place], Wendee Beavers, PA-C   1 year ago Essential (primary) hypertension   [employer], Vickki Muff, PA-C       Future Appointments             In 1 week Amalia Hailey, Nyoka Lint, MD [facility]   In 4 months Fisher, Kirstie Peri, MD [facility], [place]               Requested Prescriptions  Pending Prescriptions Disp Refills   sertraline (ZOLOFT) 100 MG tablet [Pharmacy Med Name: SERTRALINE 100MG TABLETS] 60 tablet     Sig: TAKE 2 TABLETS BY [place]     Psychiatry:  Antidepressants - SSRI Passed - 06/30/2021 10:00 AM      Passed - Completed PHQ-2 or PHQ-9 in the last 360 days      Passed - Valid encounter within last 6 months    Recent Outpatient Visits           2 weeks ago  Essential (primary) hypertension   [place], PA-C   9 months ago COPD exacerbation [facility])   [place], PA-C   12 months ago Need for influenza vaccination   [employer], Vickki Muff, PA-C   1 year ago Chronic obstructive pulmonary disease with acute exacerbation [facility])   [place], Wendee Beavers, PA-C   1 year ago Essential (primary) hypertension   [employer], Vickki Muff, PA-C       Future Appointments             In 1 week Amalia Hailey, Nyoka Lint, MD [facility]   In 4 months Fisher, Kirstie Peri, MD [facility], [place]

## 2021-06-30 NOTE — Telephone Encounter (Signed)
Call to patient- original RF- sent to [facility]-06/25/21- this request is from [facility]. Left message for patient to call back with preferred drug store

## 2021-07-05 ENCOUNTER — Other Ambulatory Visit: Payer: Self-pay

## 2021-07-05 DIAGNOSIS — D72829 Elevated white blood cell count, unspecified: Secondary | ICD-10-CM | POA: Diagnosis not present

## 2021-07-05 MED ORDER — DIGOXIN 250 MCG PO TABS: 0.2500 mg | ORAL_TABLET | Freq: Every day | ORAL | 5 refills | Status: DC

## 2021-07-05 NOTE — Telephone Encounter (Signed)
Patient was suppose to have a RX called in to [facility] for Digoxin 0.25 mg. But it was not done at her last visit.    Please send this in ASAP.   [facility]

## 2021-07-05 NOTE — Telephone Encounter (Signed)
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 Ok to send into the pharmacy? Please advise. Thanks!

## 2021-07-06 LAB — CBC WITH DIFFERENTIAL/PLATELET
Basophils Absolute: 0 10*3/uL (ref 0.0–0.2)
Basos: 0 %
EOS (ABSOLUTE): 0.1 10*3/uL (ref 0.0–0.4)
Eos: 1 %
Hematocrit: 50.1 % — ABNORMAL HIGH (ref 34.0–46.6)
Hemoglobin: 16.5 g/dL — ABNORMAL HIGH (ref 11.1–15.9)
Immature Grans (Abs): 0.1 10*3/uL (ref 0.0–0.1)
Immature Granulocytes: 1 %
Lymphocytes Absolute: 1.6 10*3/uL (ref 0.7–3.1)
Lymphs: 13 %
MCH: 31.3 pg (ref 26.6–33.0)
MCHC: 32.9 g/dL (ref 31.5–35.7)
MCV: 95 fL (ref 79–97)
Monocytes Absolute: 0.8 10*3/uL (ref 0.1–0.9)
Monocytes: 6 %
Neutrophils Absolute: 10.3 10*3/uL — ABNORMAL HIGH (ref 1.4–7.0)
Neutrophils: 79 %
Platelets: 171 10*3/uL (ref 150–450)
RBC: 5.28 x10E6/uL (ref 3.77–5.28)
RDW: 14.5 % (ref 11.7–15.4)
WBC: 12.9 10*3/uL — ABNORMAL HIGH (ref 3.4–10.8)

## 2021-07-08 ENCOUNTER — Other Ambulatory Visit (HOSPITAL_COMMUNITY)
Admission: RE | Admit: 2021-07-08 | Discharge: 2021-07-08 | Disposition: A | Discharge status: Home / Self Care | Payer: Medicare HMO | Source: Ambulatory Visit | Attending: Obstetrics and Gynecology | Admitting: Obstetrics and Gynecology

## 2021-07-08 ENCOUNTER — Other Ambulatory Visit: Payer: Self-pay

## 2021-07-08 ENCOUNTER — Encounter: Payer: Self-pay | Admitting: Obstetrics and Gynecology

## 2021-07-08 ENCOUNTER — Other Ambulatory Visit: Payer: Self-pay | Admitting: Family Medicine

## 2021-07-08 ENCOUNTER — Ambulatory Visit (INDEPENDENT_AMBULATORY_CARE_PROVIDER_SITE_OTHER): Payer: Medicare HMO | Admitting: Obstetrics and Gynecology

## 2021-07-08 VITALS — BP 121/77 | HR 102 | Ht 62.0 in | Wt 175.9 lb

## 2021-07-08 DIAGNOSIS — Z8742 Personal history of other diseases of the female genital tract: Secondary | ICD-10-CM | POA: Diagnosis not present

## 2021-07-08 DIAGNOSIS — Z01419 Encounter for gynecological examination (general) (routine) without abnormal findings: Secondary | ICD-10-CM | POA: Diagnosis not present

## 2021-07-08 DIAGNOSIS — N3941 Urge incontinence: Secondary | ICD-10-CM | POA: Diagnosis not present

## 2021-07-08 DIAGNOSIS — Z1151 Encounter for screening for human papillomavirus (HPV): Secondary | ICD-10-CM | POA: Insufficient documentation

## 2021-07-08 MED ORDER — TOLTERODINE TARTRATE ER 4 MG PO CP24: 4.0000 mg | ORAL_CAPSULE | Freq: Every day | ORAL | 2 refills | Status: DC

## 2021-07-08 NOTE — Progress Notes (Signed)
HPI:      [name] is a 65 y.o. [phone] who LMP was No LMP recorded. Patient has had a hysterectomy.  Subjective:   She presents today for her annual examination.  She has no complaints today.  She does state that Detrol has helped with her urine loss but "not enough".  She would like to try higher dose if possible.  She reports that she is not leaking as much as before. She has now off of all of her blood pressure medications and is maintaining a good blood pressure now that she has undergone significant weight loss. Her PCP orders her mammograms and that is not due until January.    Hx: The following portions of the patient's history were reviewed and updated as appropriate:             She  has a past medical history of Acute postoperative pain (10/03/2017), Anxiety, BiPAP (biphasic positive airway pressure) dependence, Carpal tunnel syndrome, CHF (congestive heart failure) ([place]), CHF (congestive heart failure) ([place]), Chronic generalized abdominal pain, Chronic rhinitis, COPD (chronic obstructive pulmonary disease) ([place]), DDD (degenerative disc disease), cervical, DDD (degenerative disc disease), lumbosacral, Depression, Dyspnea, Edema, Flu, Gastritis, GERD (gastroesophageal reflux disease), Hematuria, Hernia of abdominal wall (07/17/2018), Hernia, abdominal, Hypertension, Kidney stones, Low back pain, Lumbar radiculitis, Malodorous urine, Muscle weakness, Obesity, Oxygen dependent, Renal cyst, Sensory urge incontinence, Thyroid activity decreased, Tobacco abuse, and Wheezing. She does not have any pertinent problems on file. She  has a past surgical history that includes Abdominal hysterectomy; Tonsillectomy; Carpal tunnel release (Left, 2012); Tubal ligation; epigastric hernia repair (N/A, 08/13/2018); Umbilical hernia repair (N/A, 08/13/2018); and RIGHT/LEFT HEART CATH AND CORONARY ANGIOGRAPHY (N/A, 07/11/2019). Her family history includes Alcohol abuse in her father; Breast cancer in  her sister; Cancer in her brother, brother, and sister; Coronary artery disease in her mother; Heart disease in her father and mother; Lung cancer in her sister; Pneumonia in her brother; Stroke in her mother. She  reports that she has been smoking cigarettes. She has a 22.00 pack-year smoking history. She has never used smokeless tobacco. She reports that she does not drink alcohol and does not use drugs. She has a current medication list which includes the following prescription(s): albuterol, aspirin ec, cholecalciferol, diazepam, digoxin, doxycycline, fluticasone-umeclidin-vilant, gabapentin, hydrocodone-acetaminophen, ipratropium-albuterol, levothyroxine, oxygen-helium, potassium chloride sa, prednisone, daliresp, sertraline, simvastatin, tolterodine, torsemide, hydrocodone-acetaminophen, and hydrocodone-acetaminophen. She is allergic to codeine and meloxicam.       Review of Systems:  Review of Systems  Constitutional: Denied constitutional symptoms, night sweats, recent illness, fatigue, fever, insomnia and weight loss.  Eyes: Denied eye symptoms, eye pain, photophobia, vision change and visual disturbance.  Ears/Nose/Throat/Neck: Denied ear, nose, throat or neck symptoms, hearing loss, nasal discharge, sinus congestion and sore throat.  Cardiovascular: Denied cardiovascular symptoms, arrhythmia, chest pain/pressure, edema, exercise intolerance, orthopnea and palpitations.  Respiratory: Denied pulmonary symptoms, asthma, pleuritic pain, productive sputum, cough, dyspnea and wheezing.  Gastrointestinal: Denied, gastro-esophageal reflux, melena, nausea and vomiting.  Genitourinary: Denied genitourinary symptoms including symptomatic vaginal discharge, pelvic relaxation issues, and urinary complaints.  Musculoskeletal: Denied musculoskeletal symptoms, stiffness, swelling, muscle weakness and myalgia.  Dermatologic: Denied dermatology symptoms, rash and scar.  Neurologic: Denied neurology  symptoms, dizziness, headache, neck pain and syncope.  Psychiatric: Denied psychiatric symptoms, anxiety and depression.  Endocrine: Denied endocrine symptoms including hot flashes and night sweats.   Meds:   Current Outpatient Medications on File Prior to Visit  Medication Sig Dispense Refill   albuterol (VENTOLIN  HFA) 108 (90 Base) MCG/ACT inhaler Inhale 2 puffs into the lungs every 6 (six) hours as needed for wheezing or shortness of breath. 8 g 1   aspirin EC 81 MG tablet Take 81 mg by mouth daily.     Cholecalciferol 50 MCG (2000 UT) CAPS Take 2,000 Units by mouth daily.      diazepam (VALIUM) 10 MG tablet Take 1 tablet (10 mg total) by mouth 60 (sixty) minutes before procedure for 1 dose. Take with a sip of water, on an empty stomach. Do not eat anything for 6 hours prior to procedure. 1 tablet 0   doxycycline (VIBRA-TABS) 100 MG tablet Take 1 tablet (100 mg total) by mouth 2 (two) times daily. 20 tablet 0   Fluticasone-Umeclidin-Vilant 100-62.5-25 MCG/INH AEPB Inhale 1 puff into the lungs daily.     gabapentin (NEURONTIN) 400 MG capsule Take 1 capsule (400 mg total) by mouth 3 (three) times daily. 90 capsule 5   HYDROcodone-acetaminophen (NORCO/VICODIN) 5-325 MG tablet Take 1 tablet by mouth every 8 (eight) hours as needed for severe pain. Must last 30 days 90 tablet 0   ipratropium-albuterol (DUONEB) 0.5-2.5 (3) MG/3ML SOLN USE 1 VIAL VIA NEBULIZER EVERY 6 HOURS AS NEEDED FOR SHORTNESS OF BREATH OR WHEEZING 1080 mL 0   levothyroxine (SYNTHROID) 25 MCG tablet TAKE 1 TABLET(25 MCG) BY MOUTH DAILY BEFORE BREAKFAST 90 tablet 1   OXYGEN Inhale 2.5 L/min into the lungs.     potassium chloride SA (KLOR-CON) 20 MEQ tablet TAKE 1 TABLET(20 MEQ) BY MOUTH TWICE DAILY 180 tablet 3   predniSONE (DELTASONE) 50 MG tablet Take 1 pill, 44m daily for 5 days 5 tablet 0   Roflumilast (DALIRESP) 250 MCG TABS Take 500 mcg by mouth daily as needed.     sertraline (ZOLOFT) 100 MG tablet TAKE 2 TABLETS BY  MOUTH EVERY MORNING 180 tablet 1   simvastatin (ZOCOR) 20 MG tablet Take 1 tablet (20 mg total) by mouth at bedtime. 90 tablet 3   torsemide (DEMADEX) 20 MG tablet Take 2 tablets (40 mg total) by mouth 2 (two) times daily. 360 tablet 3   HYDROcodone-acetaminophen (NORCO/VICODIN) 5-325 MG tablet Take 1 tablet by mouth every 8 (eight) hours as needed for severe pain. Must last 30 days 90 tablet 0   HYDROcodone-acetaminophen (NORCO/VICODIN) 5-325 MG tablet Take 1 tablet by mouth every 8 (eight) hours as needed for severe pain. Must last 30 days 90 tablet 0   No current facility-administered medications on file prior to visit.     Objective:     Vitals:   07/08/21 1146  BP: 121/77  Pulse: (!) 102    Filed Weights   07/08/21 1146  Weight: 175 lb 14.4 oz (79.8 kg)              Physical examination General NAD, Conversant  HEENT Atraumatic; Op clear with mmm.  Normo-cephalic. Pupils reactive. Anicteric sclerae  Thyroid/Neck Smooth without nodularity or enlargement. Normal ROM.  Neck Supple.  Skin No rashes, lesions or ulceration. Normal palpated skin turgor. No nodularity.  Breasts: No masses or discharge.  Symmetric.  No axillary adenopathy.  Lungs: Clear to auscultation.No rales or wheezes. Normal Respiratory effort, no retractions.  Heart: NSR.  No murmurs or rubs appreciated. No periferal edema  Abdomen: Soft.  Non-tender.  No masses.  No HSM. No hernia  Extremities: Moves all appropriately.  Normal ROM for age. No lymphadenopathy.  Neuro: Oriented to PPT.  Normal mood. Normal affect.  Pelvic:   Vulva: Normal appearance.  No lesions.  Vagina: No lesions or abnormalities noted.  Support: Normal pelvic support.  Urethra No masses tenderness or scarring.  Meatus Normal size without lesions or prolapse.  Cervix: Normal appearance.  No lesions.  Anus: Normal exam.  No lesions.  Perineum: Normal exam.  No lesions.        Bimanual   Uterus: Normal size.  Non-tender.  Mobile.   AV.  Adnexae: No masses.  Non-tender to palpation.  Cul-de-sac: Negative for abnormality.     Assessment:    G2P2002 Patient Active Problem List   Diagnosis Date Noted   Chronic shoulder pain (Bilateral) 05/05/2021   Chronic use of opiate for therapeutic purpose 01/26/2021   Left thigh pain 65/78/4696   Uncomplicated opioid dependence ([place]) 04/08/2020   Pharmacologic therapy 04/08/2020   Pneumonia due to COVID-19 virus 02/03/2020   Numbness and tingling in right hand 12/31/2019   Numbness in feet 12/31/2019   Altered consciousness 11/26/2019   Frequent falls 11/26/2019   Weakness of both lower extremities 11/26/2019   Lumbar disc extrusion (L2-3) (Left) 09/10/2019   Chronic knee pain (Left) 08/14/2019   Chronic left-sided lumbar radiculopathy 08/14/2019   Abnormal MRI, lumbar spine (07/23/2019) 08/14/2019   Acute on chronic respiratory failure with hypoxia (HCC) 07/06/2019   Cervicalgia (Bilateral) (R>L) 09/24/2018   Spondylosis without myelopathy or radiculopathy, lumbosacral region 09/24/2018   Epigastric hernia 05/04/2018   Chronic lower extremity pain (Bilateral) (L>R) 01/25/2018   Altered mental status, unspecified 12/09/2017   Chronic hip pain (3ry area of Pain) (Bilateral) (L>R) 11/27/2017   COPD (chronic obstructive pulmonary disease) ([place]) 08/11/2017   Other long term (current) drug therapy 08/08/2017   Disorder of bone, unspecified 08/08/2017   COPD with hypoxia ([place]) 06/27/2017   Arthralgia of acromioclavicular joint (Right) 06/15/2017   Acromioclavicular joint DJD (Right) 06/15/2017   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   Chronic shoulder pain (Left) 05/11/2017   Elevated brain natriuretic peptide (BNP) level 05/11/2017   Grief at loss of child 02/14/2017   Lumbar facet hypertrophy (multilevel) 11/17/2016   Lumbar spinal stenosis (L4-5) 11/17/2016   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) 11/17/2016   Lumbar disc herniation with foraminal protrusion  (L4-5) (Left) 11/17/2016   Lumbosacral radiculopathy at L4 11/17/2016   Supplemental oxygen dependent 08/01/2016   Sleep apnea 06/02/2016   Long term current use of opiate analgesic 01/12/2016   Long term prescription opiate use 01/12/2016   Opiate use (15 MME/Day) 01/12/2016   Lumbar facet syndrome (Bilateral) (L>R) 01/12/2016   Chronic sacroiliac joint pain (Bilateral) 01/12/2016   Vitamin D deficiency 12/30/2015   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) 12/28/2015   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[place]) 11/19/2015   Coagulation disorder ([place]) 10/22/2015   Chronic pain syndrome (significant psychosocial component) 09/21/2015   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 09/21/2015   Encounter for therapeutic drug level monitoring 09/16/2015   Encounter for chronic pain management 09/16/2015   Pain management 09/16/2015   Neurogenic pain 09/16/2015   Neuropathic pain 09/16/2015   Musculoskeletal pain 09/16/2015   Lumbar spondylosis (L4-5) 09/16/2015   Chronic low back pain (1ry area of Pain) (Bilateral) (L>R) w/o sciatica 09/16/2015   Chronic lower extremity pain (2ry area of Pain) (Left) 09/16/2015   Chronic lumbar radicular pain (L4 & S1 Dermatome) (Left) 09/16/2015   Chronic shoulder pain (Right side) 09/16/2015   Chronic upper extremity pain (Right-sided) 09/16/2015   Cervical radiculitis (Right side) 09/16/2015  Abnormal x-ray of lumbar spine 09/16/2015   Chronic diastolic heart failure ([place]) 07/15/2015   Chest pain 07/15/2015   Tobacco abuse 07/15/2015   Tendinitis 04/21/2015   Blood clotting disorder ([place]) 04/21/2015   Decreased motor strength 07/16/2014   Clinical depression 07/16/2014   Chronic rhinitis 07/16/2014   Carpal tunnel syndrome 07/16/2014   Anxiety state 07/16/2014   Nausea without vomiting 07/16/2014   Edema 07/16/2014   Generalized muscle weakness 07/16/2014   Neuritis or radiculitis due to rupture of lumbar intervertebral  disc 07/02/2014   DDD (degenerative disc disease), lumbar 07/02/2014   CAFL (chronic airflow limitation) ([place]) 02/27/2014   Herpes zoster 07/16/2009   Essential (primary) hypertension 03/27/2008     1. Well woman exam with routine gynecological exam   2. History of abnormal cervical Pap smear   3. Urge incontinence of urine     Detrol helping some with urge incontinence patient would like to try a higher dose.   Plan:            1.  Basic Screening Recommendations The basic screening recommendations for asymptomatic women were discussed with the patient during her visit.  The age-appropriate recommendations were discussed with her and the rational for the tests reviewed.  When I am informed by the patient that another primary care physician has previously obtained the age-appropriate tests and they are up-to-date, only outstanding tests are ordered and referrals given as necessary.  Abnormal results of tests will be discussed with her when all of her results are completed.  Routine preventative health maintenance measures emphasized: Exercise/Diet/Weight control, Tobacco Warnings, Alcohol/Substance use risks and Stress Management Pap Co-test performed -patient to get mammogram through PCP in January as previously scheduled 2.  Increase Detrol to 4 mg daily. Orders No orders of the defined types were placed in this encounter.    Meds ordered this encounter  Medications   tolterodine (DETROL LA) 4 MG 24 hr capsule    Sig: Take 1 capsule (4 mg total) by mouth daily.    Dispense:  30 capsule    Refill:  2          F/U  No follow-ups on file.  Finis Bud, M.D. 07/08/2021 12:05 PM

## 2021-07-09 DIAGNOSIS — J449 Chronic obstructive pulmonary disease, unspecified: Secondary | ICD-10-CM | POA: Diagnosis not present

## 2021-07-09 LAB — CYTOLOGY - PAP
Comment: NEGATIVE
Diagnosis: NEGATIVE
High risk HPV: NEGATIVE

## 2021-07-22 DIAGNOSIS — J9610 Chronic respiratory failure, unspecified whether with hypoxia or hypercapnia: Secondary | ICD-10-CM | POA: Diagnosis not present

## 2021-07-26 ENCOUNTER — Other Ambulatory Visit: Payer: Self-pay | Admitting: Family

## 2021-07-26 MED ORDER — TORSEMIDE 20 MG PO TABS: 40.0000 mg | ORAL_TABLET | Freq: Two times a day (BID) | ORAL | 3 refills | Status: DC

## 2021-07-26 MED ORDER — POTASSIUM CHLORIDE CRYS ER 20 MEQ PO TBCR: EXTENDED_RELEASE_TABLET | ORAL | 3 refills | Status: DC

## 2021-07-26 NOTE — Progress Notes (Signed)
PROVIDER NOTE: Information contained herein reflects review and annotations entered in association with encounter. Interpretation of such information and data should be left to medically-trained personnel. Information provided to patient can be located elsewhere in the medical record under "Patient Instructions". Document created using STT-dictation technology, any transcriptional errors that may result from process are unintentional.    Patient: Theresa [name]  Service Category: E/M  Provider: Gaspar Cola, MD  DOB: 16-Sep-1956  DOS: 07/28/2021  Specialty: Interventional Pain Management  MRN: [id number]  Setting: Ambulatory outpatient  PCP: Margo Common, PA-C (Inactive)  Type: Established Patient    Referring Provider: Margo Common, PA-C  Location: Office  Delivery: Face-to-face     HPI  [name], a 65 y.o. year old female, is here today because of her Chronic bilateral low back pain without sciatica [M54.50, G89.29]. [name] primary complain today is Back Pain (lower) Last encounter: My last encounter with her was on 06/24/2021. Pertinent problems: [name] has Tendinitis; Neuritis or radiculitis due to rupture of lumbar intervertebral disc; Decreased motor strength; DDD (degenerative disc disease), lumbar; Carpal tunnel syndrome; Neurogenic pain; Neuropathic pain; Musculoskeletal pain; Lumbar spondylosis (L4-5); Chronic low back pain (1ry area of Pain) (Bilateral) (L>R) w/o sciatica; Chronic lower extremity pain (2ry area of Pain) (Left); Chronic lumbar radicular pain (L4 & S1 Dermatome) (Left); Chronic shoulder pain (Right side); Chronic upper extremity pain (Right-sided); Cervical radiculitis (Right side); Abnormal x-ray of lumbar spine; Chronic pain syndrome (significant psychosocial component); 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; 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); Lumbar facet syndrome (Bilateral) (L>R); Chronic sacroiliac joint  pain (Bilateral); Lumbar facet hypertrophy (multilevel); Lumbar spinal stenosis (L4-5); 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); 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); Lumbosacral radiculopathy at L4; Chronic shoulder pain (Left); Arthralgia of acromioclavicular joint (Right); Acromioclavicular joint DJD (Right); Osteoarthritis of shoulder (Right); Chronic hip pain (3ry area of Pain) (Bilateral) (L>R); Chronic lower extremity pain (Bilateral) (L>R); Cervicalgia (Bilateral) (R>L); Spondylosis without myelopathy or radiculopathy, lumbosacral region; Chronic knee pain (Left); Chronic left-sided lumbar radiculopathy; Abnormal MRI, lumbar spine (07/23/2019); 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 Pain Assessment: Severity of Chronic pain is reported as a 5 /10. Location: Back Lower/Denies. Onset: More than a month ago. Quality: Stabbing, Aching. Timing: Constant. Modifying factor(s): Meds. Vitals:  height is _0  (1.575 m) and weight is 175 lb (79.4 kg). Her temperature is 97.2 F (36.2 C) (abnormal). Her blood pressure is 122/81 and her pulse is 89. Her respiration is 15 and oxygen saturation is 93%.   Reason for encounter: medication management.   The patient indicates doing well with the current medication regimen. No adverse reactions or side effects reported to the medications.   RTCB: 11/01/2021 Nonopioids transferred 08/03/2020: Gabapentin and baclofen  Pharmacotherapy Assessment  Analgesic: Hydrocodone/APAP 5/325 one tablet every 8 hours (15 mg/day of hydrocodone) MME/day: 15 mg/day.   Monitoring: [place] PMP: PDMP reviewed during this encounter.       Pharmacotherapy: No side-effects or adverse reactions reported. Compliance: No problems identified. Effectiveness:  Clinically acceptable.  Theresa Fischer, RN  07/28/2021  3:04 PM  Sign when Signing Visit Nursing Pain Medication Assessment:  Safety precautions to be maintained throughout the outpatient stay will include: orient to surroundings, keep bed in low position, maintain call bell within reach at  all times, provide assistance with transfer out of bed and ambulation.  Medication Inspection Compliance: Pill count conducted under aseptic conditions, in front of the patient. Neither the pills nor the bottle was removed from the patient's sight at any time. Once count was completed pills were immediately returned to the patient in their original bottle.  Medication: Hydrocodone/APAP Pill/Patch Count:  13 of 90 pills remain Pill/Patch Appearance: Markings consistent with prescribed medication Bottle Appearance: Standard pharmacy container. Clearly labeled. Filled Date: 63 / 18 / 2022 Last Medication intake:  Today Safety precautions to be maintained throughout the outpatient stay will include: orient to surroundings, keep bed in low position, maintain call bell within reach at all times, provide assistance with transfer out of bed and ambulation.   Pt was filling her med box on the bathroom counter, when some of her pain pills fell down the sink.       UDS:  Summary  Date Value Ref Range Status  05/05/2021 Note  Final    Comment:    ==================================================================== [employer] Select 13 (MW) ==================================================================== Specimen Alert Note: Urinary creatinine is low; ability to detect some drugs may be compromised. Interpret results with caution. (Creatinine) ==================================================================== Test                             Result       Flag       Units  Drug Present and Declared for Prescription Verification   Hydrocodone                    1236         EXPECTED   ng/mg creat    Norhydrocodone                 986          EXPECTED   ng/mg creat    Sources of hydrocodone include scheduled prescription medications.    Norhydrocodone is an expected metabolite of hydrocodone.  Drug Absent but Declared for Prescription Verification   Diazepam                       Not Detected UNEXPECTED ng/mg creat ==================================================================== Test                      Result    Flag   Units      Ref Range   Creatinine              14        LL     mg/dL      >=20 ==================================================================== Declared Medications:  The flagging and interpretation on this report are based on the  following declared medications.  Unexpected results may arise from  inaccuracies in the declared medications.   **Note: The testing scope of this panel includes these medications:   Diazepam (Valium)  Hydrocodone (Norco)   **Note: The testing scope of this panel does not include the  following reported medications:   Acetaminophen (Norco)  Albuterol (Ventolin HFA)  Aspirin  Baclofen (Lioresal)  Cholecalciferol  Digoxin (Lanoxin)  Fluticasone  Gabapentin (Neurontin)  Umeclidinium  Vilanterol ==================================================================== For clinical consultation, please call [phone]. ====================================================================      ROS  Constitutional: Denies any fever or chills Gastrointestinal: No reported hemesis, hematochezia, vomiting, or acute GI distress Musculoskeletal: Denies any acute onset joint swelling, redness, loss of ROM, or weakness Neurological:  No reported episodes of acute onset apraxia, aphasia, dysarthria, agnosia, amnesia, paralysis, loss of coordination, or loss of consciousness  Medication Review  Cholecalciferol, Fluticasone-Umeclidin-Vilant, HYDROcodone-acetaminophen, Oxygen-Helium, Roflumilast, albuterol, aspirin EC, baclofen,  diazepam, digoxin, doxycycline, gabapentin, ipratropium-albuterol, levothyroxine, potassium chloride SA, predniSONE, sertraline, simvastatin, tolterodine, and torsemide  History Review  Allergy: Ms. Picking is allergic to codeine and meloxicam. Drug: Ms. Droessler  reports no history of drug use. Alcohol:  reports no history of alcohol use. Tobacco:  reports that she has been smoking cigarettes. She has a 22.00 pack-year smoking history. She has never used smokeless tobacco. Social: Ms. Devincentis  reports that she has been smoking cigarettes. She has a 22.00 pack-year smoking history. She has never used smokeless tobacco. She reports that she does not drink alcohol and does not use drugs. Medical:  has a past medical history of Acute postoperative pain (10/03/2017), Anxiety, BiPAP (biphasic positive airway pressure) dependence, Carpal tunnel syndrome, CHF (congestive heart failure) ([place]), CHF (congestive heart failure) ([place]), Chronic generalized abdominal pain, Chronic rhinitis, COPD (chronic obstructive pulmonary disease) ([place]), DDD (degenerative disc disease), cervical, DDD (degenerative disc disease), lumbosacral, Depression, Dyspnea, Edema, Flu, Gastritis, GERD (gastroesophageal reflux disease), Hematuria, Hernia of abdominal wall (07/17/2018), Hernia, abdominal, Hypertension, Kidney stones, Low back pain, Lumbar radiculitis, Malodorous urine, Muscle weakness, Obesity, Oxygen dependent, Renal cyst, Sensory urge incontinence, Thyroid activity decreased, Tobacco abuse, and Wheezing. Surgical: Ms. Phung  has a past surgical history that includes Abdominal hysterectomy; Tonsillectomy; Carpal tunnel release (Left, 2012); Tubal ligation; epigastric hernia repair (N/A, 08/13/2018); Umbilical hernia repair (N/A, 08/13/2018); and RIGHT/LEFT HEART CATH AND CORONARY ANGIOGRAPHY (N/A, 07/11/2019). Family: family history includes Alcohol abuse in her father; Breast cancer in her sister; Cancer in her brother, brother,  and sister; Coronary artery disease in her mother; Heart disease in her father and mother; Lung cancer in her sister; Pneumonia in her brother; Stroke in her mother.  Laboratory Chemistry Profile   Renal Lab Results  Component Value Date   BUN 16 06/23/2021   CREATININE 0.64 06/23/2021   BCR 23 06/10/2021   GFRAA >60 06/26/2020   GFRNONAA >60 06/23/2021    Hepatic Lab Results  Component Value Date   AST 26 06/10/2021   ALT 26 06/10/2021   ALBUMIN 4.8 06/10/2021   ALKPHOS 115 06/10/2021   LIPASE 31 10/17/2017   AMMONIA 40 (H) 10/11/2019    Electrolytes Lab Results  Component Value Date   NA 137 06/23/2021   K 3.4 (L) 06/23/2021   CL 99 06/23/2021   CALCIUM 9.2 06/23/2021   MG 2.7 (H) 02/10/2020   PHOS 4.0 02/10/2020    Bone Lab Results  Component Value Date   25OHVITD1 13 (L) 09/17/2015   25OHVITD2 <1.0 09/17/2015   25OHVITD3 13 09/17/2015    Inflammation (CRP: Acute Phase) (ESR: Chronic Phase) Lab Results  Component Value Date   CRP 4.2 (H) 02/08/2020   ESRSEDRATE 16 08/08/2017   LATICACIDVEN 1.4 02/03/2020         Note: Above Lab results reviewed.  Recent Imaging Review  CT Angio Chest PE W and/or Wo Contrast CLINICAL DATA:  65 year old female with history of shortness of breath and right-sided back pain. Suspected pulmonary embolism. On oxygen at home.  EXAM: CT ANGIOGRAPHY CHEST WITH CONTRAST  TECHNIQUE: Multidetector CT imaging of the chest was performed using the standard protocol during bolus administration of intravenous contrast. Multiplanar CT image reconstructions and MIPs were obtained to evaluate the vascular anatomy.  CONTRAST:  19m OMNIPAQUE IOHEXOL 350 MG/ML SOLN  COMPARISON:  Chest CTA 02/04/2021.  FINDINGS: Cardiovascular: No filling defects within the pulmonary arterial tree to suggest pulmonary embolism. Heart size is normal. There is no significant pericardial fluid, thickening or pericardial calcification. There is aortic  atherosclerosis, as well as atherosclerosis of the great vessels of the mediastinum and the coronary arteries, including calcified atherosclerotic plaque in the left anterior descending, left circumflex and right coronary arteries. Dilatation of the pulmonic trunk (3.6 cm in diameter).  Mediastinum/Nodes: No pathologically enlarged mediastinal or hilar lymph nodes. Esophagus is unremarkable in appearance. No axillary lymphadenopathy.  Lungs/Pleura: No acute consolidative airspace disease. No pleural effusions. Diffuse bronchial wall thickening with moderate centrilobular and paraseptal emphysema. A few scattered tiny pulmonary nodules are noted, largest of which measures only 3 mm in the right lower lobe (axial image 72 of series 6), stable dating back to 04/02/2020, considered definitively benign. No larger more suspicious appearing pulmonary nodules or masses are noted.  Upper Abdomen: Multiple small low-attenuation lesions scattered throughout the visualized hepatic parenchyma, similar to prior examinations, incompletely characterized on today's examination, but statistically likely to represent small cysts. Aortic atherosclerosis.  Musculoskeletal: There are no aggressive appearing lytic or blastic lesions noted in the visualized portions of the skeleton.  Review of the MIP images confirms the above findings.  IMPRESSION: 1. No evidence of pulmonary embolism. 2. No acute findings in the thorax to account for the patient's symptoms. 3. Diffuse bronchial wall thickening with moderate centrilobular and paraseptal emphysema; imaging findings suggestive of underlying COPD. 4. Dilatation of the pulmonic trunk (3.6 cm in diameter), concerning for associated pulmonary arterial hypertension. 5. Aortic atherosclerosis, in addition to three vessel coronary artery disease. Please note that although the presence of coronary artery calcium documents the presence of coronary artery  disease, the severity of this disease and any potential stenosis cannot be assessed on this non-gated CT examination. Assessment for potential risk factor modification, dietary therapy or pharmacologic therapy may be warranted, if clinically indicated.  Aortic Atherosclerosis (ICD10-I70.0) and Emphysema (ICD10-J43.9).  Electronically Signed   By: Vinnie Langton M.D.   On: 06/23/2021 20:55 DG Chest 2 View CLINICAL DATA:  3 days she has had increased SHOB  EXAM: CHEST - 2 VIEW  COMPARISON:  February 04, 2021.  FINDINGS: The heart size and mediastinal contours are within normal limits. Central vascular prominence. Chronic bronchitic lung changes with pulmonary hyperinflation. Linear atelectasis in bilateral lung bases. No visible pleural effusion or pneumothorax. The visualized skeletal structures are unremarkable.  IMPRESSION: 1. Chronic bronchitic lung changes with pulmonary hyperinflation. 2. Linear atelectasis in bilateral lung bases.  Electronically Signed   By: Dahlia Bailiff M.D.   On: 06/23/2021 19:29 Note: Reviewed        Physical Exam  General appearance: Well nourished, well developed, and well hydrated. In no apparent acute distress Mental status: Alert, oriented x 3 (person, place, & time)       Respiratory: No evidence of acute respiratory distress Eyes: PERLA Vitals: BP 122/81   Pulse 89   Temp (!) 97.2 F (36.2 C)   Resp 15   Ht _0  (1.575 m)   Wt 175 lb (79.4 kg)   SpO2 93% Comment: 2.5 liters O2  BMI 32.01 kg/m  BMI: Estimated body mass index is 32.01 kg/m as calculated from the following:   Height as of this encounter: _1  (1.575 m).   Weight as of this encounter: 175 lb (79.4 kg). Ideal: Ideal body weight: 50.1 kg (110 lb 7.2 oz)  Adjusted ideal body weight: 61.8 kg (136 lb 4.3 oz)  Assessment   Status Diagnosis  Controlled Controlled Controlled 1. Chronic low back pain (1ry area of Pain) (Bilateral) (L>R) w/o sciatica   2. Lumbar  facet syndrome (Bilateral) (L>R)   3. DDD (degenerative disc disease), lumbar   4. Chronic lower extremity pain (2ry area of Pain) (Left)   5. Chronic left-sided lumbar radiculopathy   6. Chronic hip pain (3ry area of Pain) (Bilateral) (L>R)   7. Chronic knee pain (Left)   8. Pharmacologic therapy   9. Chronic use of opiate for therapeutic purpose   10. Encounter for chronic pain management   11. Encounter for medication management   12. Chronic pain syndrome (significant psychosocial component)      Updated Problems: No problems updated.  Plan of Care  Problem-specific:  No problem-specific Assessment & Plan notes found for this encounter.  Ms. Iylah Dworkin has a current medication list which includes the following long-term medication(s): albuterol, digoxin, gabapentin, ipratropium-albuterol, levothyroxine, potassium chloride sa, sertraline, simvastatin, torsemide, [START ON 08/03/2021] hydrocodone-acetaminophen, [START ON 09/02/2021] hydrocodone-acetaminophen, and [START ON 10/02/2021] hydrocodone-acetaminophen.  Pharmacotherapy (Medications Ordered): Meds ordered this encounter  Medications   HYDROcodone-acetaminophen (NORCO/VICODIN) 5-325 MG tablet    Sig: Take 1 tablet by mouth every 8 (eight) hours as needed for severe pain. Must last 30 days    Dispense:  90 tablet    Refill:  0    DO NOT: delete (not duplicate); no partial-fill (will deny script to complete), no refill request (F/U required). DISPENSE: 1 day early if closed on fill date. WARN: No CNS-depressants within 8 hrs of med.   HYDROcodone-acetaminophen (NORCO/VICODIN) 5-325 MG tablet    Sig: Take 1 tablet by mouth every 8 (eight) hours as needed for severe pain. Must last 30 days    Dispense:  90 tablet    Refill:  0    DO NOT: delete (not duplicate); no partial-fill (will deny script to complete), no refill request (F/U required). DISPENSE: 1 day early if closed on fill date. WARN: No CNS-depressants within 8 hrs  of med.   HYDROcodone-acetaminophen (NORCO/VICODIN) 5-325 MG tablet    Sig: Take 1 tablet by mouth every 8 (eight) hours as needed for severe pain. Must last 30 days    Dispense:  90 tablet    Refill:  0    DO NOT: delete (not duplicate); no partial-fill (will deny script to complete), no refill request (F/U required). DISPENSE: 1 day early if closed on fill date. WARN: No CNS-depressants within 8 hrs of med.    Orders:  Orders Placed This Encounter  Procedures   Lumbar Epidural Injection    Standing Status:   Future    Standing Expiration Date:   10/28/2021    Scheduling Instructions:     Procedure: Interlaminar Lumbar Epidural Steroid injection (LESI)  L2-3     Laterality: Left-sided     Sedation: None required.     Timeframe: ASAA    Order Specific Question:   Where will this procedure be performed?    Answer:   [facility]   Informed Consent Details: Physician/Practitioner Attestation; Transcribe to consent form and obtain patient signature    Note: Always confirm laterality of pain with Ms. Alwyn Ren, before procedure. Transcribe to consent form and obtain patient signature.    Order Specific Question:   Physician/Practitioner attestation of informed consent for procedure/surgical case    Answer:   I, the physician/practitioner, attest that I have  discussed with the patient the benefits, risks, side effects, alternatives, likelihood of achieving goals and potential problems during recovery for the procedure that I have provided informed consent.    Order Specific Question:   Procedure    Answer:   Lumbar epidural steroid injection under fluoroscopic guidance    Order Specific Question:   Physician/Practitioner performing the procedure    Answer:   Alexarae Oliva A. Dossie Arbour, MD    Order Specific Question:   Indication/Reason    Answer:   Low back and/or lower extremity pain secondary to lumbar radiculitis    Follow-up plan:   Return for (Clinic) procedure: (L) L2-3 LESI, (NS).      Interventional Therapies  Risk  Complexity Considerations:   Estimated body mass index is 32.01 kg/m as calculated from the following:   Height as of this encounter: _0  (1.575 m).   Weight as of this encounter: 175 lb (79.4 kg). NOTE: NO further Lumbar RFA until BMI <35.   Planned  Pending:   Pending further evaluation   Under consideration:   Palliative left L2-3 LESI #2    Completed:   Palliative bilateral lumbar facet block x4 (02/12/2019)  Therapeutic left L2-3 LESI x1 (09/10/2019)  Diagnostic/palliative left L4 TFESI x1  Diagnostic/palliative left L4-5 LESI x2  Diagnostic/palliative right L4 TFESI x1  Diagnostic/palliative right L4-5 LESI x1  Palliative left lumbar facet RFA x1 (06/27/2017)  Palliative right lumbar facet RFA x1 (10/03/2017)    Therapeutic  Palliative (PRN) options:   Palliative bilateral lumbar facet block #4  Diagnostic/palliative left L4 TFESI #2  Diagnostic/palliative left L4-5 LESI #3  Diagnostic/palliative right L4 TFESI #2  Diagnostic/palliative right L4-5 LESI #2  Palliative left lumbar facet RFA #2  Palliative right lumbar facet RFA #2     Recent Visits Date Type Provider Dept  05/27/21 Telemedicine Milinda Pointer, MD [facility]  05/13/21 Procedure visit Milinda Pointer, MD [facility]  05/05/21 Office Visit Milinda Pointer, MD [facility]  Showing recent visits within past 90 days and meeting all other requirements Today's Visits Date Type Provider Dept  07/28/21 Office Visit Milinda Pointer, MD [facility]  Showing today's visits and meeting all other requirements Future Appointments No visits were found meeting these conditions. Showing future appointments within next 90 days and meeting all other requirements I discussed the assessment and treatment plan with the patient. The patient was provided an opportunity to ask questions and all were answered. The patient agreed with  the plan and demonstrated an understanding of the instructions.  Patient advised to call back or seek an in-person evaluation if the symptoms or condition worsens.  Duration of encounter: 30 minutes.  Note by: Gaspar Cola, MD Date: 07/28/2021; Time: 3:36 PM

## 2021-07-28 ENCOUNTER — Other Ambulatory Visit: Payer: Self-pay

## 2021-07-28 ENCOUNTER — Encounter: Payer: Self-pay | Admitting: Pain Medicine

## 2021-07-28 ENCOUNTER — Ambulatory Visit: Payer: Medicare HMO | Attending: Pain Medicine | Admitting: Pain Medicine

## 2021-07-28 ENCOUNTER — Ambulatory Visit: Payer: Self-pay

## 2021-07-28 VITALS — BP 122/81 | HR 89 | Temp 97.2°F | Resp 15 | Ht 62.0 in | Wt 175.0 lb

## 2021-07-28 DIAGNOSIS — M5136 Other intervertebral disc degeneration, lumbar region: Secondary | ICD-10-CM | POA: Diagnosis not present

## 2021-07-28 DIAGNOSIS — M79605 Pain in left leg: Secondary | ICD-10-CM | POA: Diagnosis not present

## 2021-07-28 DIAGNOSIS — M25551 Pain in right hip: Secondary | ICD-10-CM | POA: Diagnosis not present

## 2021-07-28 DIAGNOSIS — M5416 Radiculopathy, lumbar region: Secondary | ICD-10-CM

## 2021-07-28 DIAGNOSIS — M47816 Spondylosis without myelopathy or radiculopathy, lumbar region: Secondary | ICD-10-CM | POA: Diagnosis not present

## 2021-07-28 DIAGNOSIS — M545 Low back pain, unspecified: Secondary | ICD-10-CM | POA: Diagnosis not present

## 2021-07-28 DIAGNOSIS — Z79899 Other long term (current) drug therapy: Secondary | ICD-10-CM | POA: Diagnosis not present

## 2021-07-28 DIAGNOSIS — G894 Chronic pain syndrome: Secondary | ICD-10-CM

## 2021-07-28 DIAGNOSIS — M25552 Pain in left hip: Secondary | ICD-10-CM

## 2021-07-28 DIAGNOSIS — G8929 Other chronic pain: Secondary | ICD-10-CM | POA: Diagnosis not present

## 2021-07-28 DIAGNOSIS — M25562 Pain in left knee: Secondary | ICD-10-CM

## 2021-07-28 DIAGNOSIS — Z79891 Long term (current) use of opiate analgesic: Secondary | ICD-10-CM | POA: Diagnosis not present

## 2021-07-28 MED ORDER — HYDROCODONE-ACETAMINOPHEN 5-325 MG PO TABS: 1.0000 | ORAL_TABLET | Freq: Three times a day (TID) | ORAL | 0 refills | Status: DC | PRN

## 2021-07-28 NOTE — Telephone Encounter (Signed)
She needs to go to urgent care unless her pulmonologist can see her this week.

## 2021-07-28 NOTE — Telephone Encounter (Signed)
Pt. Reports she started coughing Monday. Coughing up brown mucus. Has increased wheezing and shortness of breath. Has also been choking , even when she is not eating or drinking. No availability until November. Would like to be worked in. Will call her pulmonologist as well. Please advise pt.  Reason for Disposition  Coughing up rusty-colored (reddish-brown) sputum  Answer Assessment - Initial Assessment Questions 1. ONSET: "When did the cough begin?"      Monday 2. SEVERITY: "How bad is the cough today?"      Moderate 3. SPUTUM: "Describe the color of your sputum" (none, dry cough; clear, white, yellow, green)     Brown 4. HEMOPTYSIS: "Are you coughing up any blood?" If so ask: "How much?" (flecks, streaks, tablespoons, etc.)     No 5. DIFFICULTY BREATHING: "Are you having difficulty breathing?" If Yes, ask: "How bad is it?" (e.g., mild, moderate, severe)    - MILD: No SOB at rest, mild SOB with walking, speaks normally in sentences, can lie down, no retractions, pulse < 100.    - MODERATE: SOB at rest, SOB with minimal exertion and prefers to sit, cannot lie down flat, speaks in phrases, mild retractions, audible wheezing, pulse 100-120.    - SEVERE: Very SOB at rest, speaks in single words, struggling to breathe, sitting hunched forward, retractions, pulse > 120      Moderate 6. FEVER: "Do you have a fever?" If Yes, ask: "What is your temperature, how was it measured, and when did it start?"     No 7. CARDIAC HISTORY: "Do you have any history of heart disease?" (e.g., heart attack, congestive heart failure)      CHF 8. LUNG HISTORY: "Do you have any history of lung disease?"  (e.g., pulmonary embolus, asthma, emphysema)     COPD 9. PE RISK FACTORS: "Do you have a history of blood clots?" (or: recent major surgery, recent prolonged travel, bedridden)     No 10. OTHER SYMPTOMS: "Do you have any other symptoms?" (e.g., runny nose, wheezing, chest pain)       Wheezing, choking 11.  PREGNANCY: "Is there any chance you are pregnant?" "When was your last menstrual period?"       No 12. TRAVEL: "Have you traveled out of the country in the last month?" (e.g., travel history, exposures)       No  Protocols used: Cough - Acute Productive-A-AH

## 2021-07-28 NOTE — Progress Notes (Signed)
Nursing Pain Medication Assessment:  Safety precautions to be maintained throughout the outpatient stay will include: orient to surroundings, keep bed in low position, maintain call bell within reach at all times, provide assistance with transfer out of bed and ambulation.  Medication Inspection Compliance: Pill count conducted under aseptic conditions, in front of the patient. Neither the pills nor the bottle was removed from the patient's sight at any time. Once count was completed pills were immediately returned to the patient in their original bottle.  Medication: Hydrocodone/APAP Pill/Patch Count:  13 of 90 pills remain Pill/Patch Appearance: Markings consistent with prescribed medication Bottle Appearance: Standard pharmacy container. Clearly labeled. Filled Date: 35 / 18 / 2022 Last Medication intake:  Today Safety precautions to be maintained throughout the outpatient stay will include: orient to surroundings, keep bed in low position, maintain call bell within reach at all times, provide assistance with transfer out of bed and ambulation.   Pt was filling her med box on the bathroom counter, when some of her pain pills fell down the sink.

## 2021-07-28 NOTE — Telephone Encounter (Signed)
Patient advised and verbalized understanding 

## 2021-08-06 ENCOUNTER — Inpatient Hospital Stay
Admission: EM | Admit: 2021-08-06 | Discharge: 2021-08-12 | DRG: 190 | Disposition: A | Discharge status: Home Health | Payer: Medicare HMO | Attending: Student | Admitting: Student

## 2021-08-06 ENCOUNTER — Emergency Department: Payer: Medicare HMO

## 2021-08-06 ENCOUNTER — Other Ambulatory Visit: Payer: Self-pay

## 2021-08-06 DIAGNOSIS — R059 Cough, unspecified: Secondary | ICD-10-CM | POA: Diagnosis not present

## 2021-08-06 DIAGNOSIS — Z823 Family history of stroke: Secondary | ICD-10-CM | POA: Diagnosis not present

## 2021-08-06 DIAGNOSIS — Z79899 Other long term (current) drug therapy: Secondary | ICD-10-CM

## 2021-08-06 DIAGNOSIS — N3941 Urge incontinence: Secondary | ICD-10-CM

## 2021-08-06 DIAGNOSIS — I10 Essential (primary) hypertension: Secondary | ICD-10-CM | POA: Diagnosis not present

## 2021-08-06 DIAGNOSIS — I2781 Cor pulmonale (chronic): Secondary | ICD-10-CM | POA: Diagnosis present

## 2021-08-06 DIAGNOSIS — Z79890 Hormone replacement therapy (postmenopausal): Secondary | ICD-10-CM

## 2021-08-06 DIAGNOSIS — J449 Chronic obstructive pulmonary disease, unspecified: Secondary | ICD-10-CM | POA: Diagnosis present

## 2021-08-06 DIAGNOSIS — R1013 Epigastric pain: Secondary | ICD-10-CM | POA: Diagnosis not present

## 2021-08-06 DIAGNOSIS — F1120 Opioid dependence, uncomplicated: Secondary | ICD-10-CM | POA: Diagnosis present

## 2021-08-06 DIAGNOSIS — Z803 Family history of malignant neoplasm of breast: Secondary | ICD-10-CM | POA: Diagnosis not present

## 2021-08-06 DIAGNOSIS — E039 Hypothyroidism, unspecified: Secondary | ICD-10-CM | POA: Diagnosis not present

## 2021-08-06 DIAGNOSIS — R079 Chest pain, unspecified: Secondary | ICD-10-CM | POA: Diagnosis not present

## 2021-08-06 DIAGNOSIS — F17201 Nicotine dependence, unspecified, in remission: Secondary | ICD-10-CM | POA: Diagnosis present

## 2021-08-06 DIAGNOSIS — Z7982 Long term (current) use of aspirin: Secondary | ICD-10-CM | POA: Diagnosis not present

## 2021-08-06 DIAGNOSIS — F17200 Nicotine dependence, unspecified, uncomplicated: Secondary | ICD-10-CM | POA: Diagnosis present

## 2021-08-06 DIAGNOSIS — G894 Chronic pain syndrome: Secondary | ICD-10-CM | POA: Diagnosis present

## 2021-08-06 DIAGNOSIS — I5032 Chronic diastolic (congestive) heart failure: Secondary | ICD-10-CM | POA: Diagnosis not present

## 2021-08-06 DIAGNOSIS — Z9981 Dependence on supplemental oxygen: Secondary | ICD-10-CM

## 2021-08-06 DIAGNOSIS — I2721 Secondary pulmonary arterial hypertension: Secondary | ICD-10-CM | POA: Diagnosis not present

## 2021-08-06 DIAGNOSIS — F419 Anxiety disorder, unspecified: Secondary | ICD-10-CM | POA: Diagnosis not present

## 2021-08-06 DIAGNOSIS — E559 Vitamin D deficiency, unspecified: Secondary | ICD-10-CM | POA: Diagnosis present

## 2021-08-06 DIAGNOSIS — Z8616 Personal history of COVID-19: Secondary | ICD-10-CM | POA: Diagnosis not present

## 2021-08-06 DIAGNOSIS — T501X5A Adverse effect of loop [high-ceiling] diuretics, initial encounter: Secondary | ICD-10-CM | POA: Diagnosis not present

## 2021-08-06 DIAGNOSIS — Z720 Tobacco use: Secondary | ICD-10-CM | POA: Diagnosis present

## 2021-08-06 DIAGNOSIS — R0902 Hypoxemia: Secondary | ICD-10-CM | POA: Diagnosis not present

## 2021-08-06 DIAGNOSIS — Z8249 Family history of ischemic heart disease and other diseases of the circulatory system: Secondary | ICD-10-CM

## 2021-08-06 DIAGNOSIS — Z7951 Long term (current) use of inhaled steroids: Secondary | ICD-10-CM

## 2021-08-06 DIAGNOSIS — F17210 Nicotine dependence, cigarettes, uncomplicated: Secondary | ICD-10-CM | POA: Diagnosis present

## 2021-08-06 DIAGNOSIS — F411 Generalized anxiety disorder: Secondary | ICD-10-CM | POA: Diagnosis present

## 2021-08-06 DIAGNOSIS — Z801 Family history of malignant neoplasm of trachea, bronchus and lung: Secondary | ICD-10-CM

## 2021-08-06 DIAGNOSIS — D72824 Basophilia: Secondary | ICD-10-CM | POA: Diagnosis not present

## 2021-08-06 DIAGNOSIS — J9621 Acute and chronic respiratory failure with hypoxia: Secondary | ICD-10-CM | POA: Diagnosis present

## 2021-08-06 DIAGNOSIS — J9601 Acute respiratory failure with hypoxia: Secondary | ICD-10-CM | POA: Diagnosis present

## 2021-08-06 DIAGNOSIS — Z90710 Acquired absence of both cervix and uterus: Secondary | ICD-10-CM | POA: Diagnosis not present

## 2021-08-06 DIAGNOSIS — M51369 Other intervertebral disc degeneration, lumbar region without mention of lumbar back pain or lower extremity pain: Secondary | ICD-10-CM | POA: Diagnosis present

## 2021-08-06 DIAGNOSIS — G4733 Obstructive sleep apnea (adult) (pediatric): Secondary | ICD-10-CM | POA: Diagnosis present

## 2021-08-06 DIAGNOSIS — Z6832 Body mass index (BMI) 32.0-32.9, adult: Secondary | ICD-10-CM

## 2021-08-06 DIAGNOSIS — E669 Obesity, unspecified: Secondary | ICD-10-CM | POA: Diagnosis present

## 2021-08-06 DIAGNOSIS — I110 Hypertensive heart disease with heart failure: Secondary | ICD-10-CM | POA: Diagnosis not present

## 2021-08-06 DIAGNOSIS — F32A Depression, unspecified: Secondary | ICD-10-CM | POA: Diagnosis not present

## 2021-08-06 DIAGNOSIS — J9622 Acute and chronic respiratory failure with hypercapnia: Secondary | ICD-10-CM | POA: Diagnosis present

## 2021-08-06 DIAGNOSIS — G4730 Sleep apnea, unspecified: Secondary | ICD-10-CM | POA: Diagnosis present

## 2021-08-06 DIAGNOSIS — J189 Pneumonia, unspecified organism: Secondary | ICD-10-CM | POA: Diagnosis not present

## 2021-08-06 DIAGNOSIS — K219 Gastro-esophageal reflux disease without esophagitis: Secondary | ICD-10-CM | POA: Diagnosis present

## 2021-08-06 DIAGNOSIS — E785 Hyperlipidemia, unspecified: Secondary | ICD-10-CM | POA: Diagnosis present

## 2021-08-06 DIAGNOSIS — E876 Hypokalemia: Secondary | ICD-10-CM | POA: Diagnosis not present

## 2021-08-06 DIAGNOSIS — R0600 Dyspnea, unspecified: Secondary | ICD-10-CM | POA: Diagnosis not present

## 2021-08-06 DIAGNOSIS — Z716 Tobacco abuse counseling: Secondary | ICD-10-CM

## 2021-08-06 DIAGNOSIS — R9431 Abnormal electrocardiogram [ECG] [EKG]: Secondary | ICD-10-CM | POA: Diagnosis not present

## 2021-08-06 DIAGNOSIS — J439 Emphysema, unspecified: Secondary | ICD-10-CM | POA: Diagnosis not present

## 2021-08-06 DIAGNOSIS — R0789 Other chest pain: Secondary | ICD-10-CM | POA: Diagnosis not present

## 2021-08-06 DIAGNOSIS — D72829 Elevated white blood cell count, unspecified: Secondary | ICD-10-CM | POA: Diagnosis present

## 2021-08-06 DIAGNOSIS — I509 Heart failure, unspecified: Secondary | ICD-10-CM | POA: Diagnosis not present

## 2021-08-06 DIAGNOSIS — J441 Chronic obstructive pulmonary disease with (acute) exacerbation: Secondary | ICD-10-CM | POA: Diagnosis not present

## 2021-08-06 DIAGNOSIS — M5136 Other intervertebral disc degeneration, lumbar region: Secondary | ICD-10-CM | POA: Diagnosis present

## 2021-08-06 LAB — CBC WITH DIFFERENTIAL/PLATELET
Abs Immature Granulocytes: 0.06 10*3/uL (ref 0.00–0.07)
Basophils Absolute: 0.1 10*3/uL (ref 0.0–0.1)
Basophils Relative: 0 %
Eosinophils Absolute: 0 10*3/uL (ref 0.0–0.5)
Eosinophils Relative: 0 %
HCT: 47.1 % — ABNORMAL HIGH (ref 36.0–46.0)
Hemoglobin: 16.2 g/dL — ABNORMAL HIGH (ref 12.0–15.0)
Immature Granulocytes: 0 %
Lymphocytes Relative: 7 %
Lymphs Abs: 1.2 10*3/uL (ref 0.7–4.0)
MCH: 33.3 pg (ref 26.0–34.0)
MCHC: 34.4 g/dL (ref 30.0–36.0)
MCV: 96.9 fL (ref 80.0–100.0)
Monocytes Absolute: 0.8 10*3/uL (ref 0.1–1.0)
Monocytes Relative: 5 %
Neutro Abs: 14.3 10*3/uL — ABNORMAL HIGH (ref 1.7–7.7)
Neutrophils Relative %: 88 %
Platelets: 168 10*3/uL (ref 150–400)
RBC: 4.86 MIL/uL (ref 3.87–5.11)
RDW: 16.6 % — ABNORMAL HIGH (ref 11.5–15.5)
WBC: 16.4 10*3/uL — ABNORMAL HIGH (ref 4.0–10.5)
nRBC: 0 % (ref 0.0–0.2)

## 2021-08-06 LAB — BRAIN NATRIURETIC PEPTIDE: B Natriuretic Peptide: 36.1 pg/mL (ref 0.0–100.0)

## 2021-08-06 LAB — BASIC METABOLIC PANEL
Anion gap: 13 (ref 5–15)
BUN: 10 mg/dL (ref 8–23)
CO2: 28 mmol/L (ref 22–32)
Calcium: 8.9 mg/dL (ref 8.9–10.3)
Chloride: 97 mmol/L — ABNORMAL LOW (ref 98–111)
Creatinine, Ser: 0.72 mg/dL (ref 0.44–1.00)
GFR, Estimated: >60 mL/min (ref >60)
Glucose, Bld: 127 mg/dL — ABNORMAL HIGH (ref 70–99)
Potassium: 3.6 mmol/L (ref 3.5–5.1)
Sodium: 138 mmol/L (ref 135–145)

## 2021-08-06 LAB — RESP PANEL BY RT-PCR (FLU A&B, COVID) ARPGX2
Influenza A by PCR: NEGATIVE
Influenza B by PCR: NEGATIVE
SARS Coronavirus 2 by RT PCR: NEGATIVE

## 2021-08-06 LAB — PROCALCITONIN: Procalcitonin: <0.1 ng/mL

## 2021-08-06 LAB — TROPONIN I (HIGH SENSITIVITY)
Troponin I (High Sensitivity): 12 ng/L (ref <18)
Troponin I (High Sensitivity): 10 ng/L (ref <18)

## 2021-08-06 LAB — GROUP A STREP BY PCR: Group A Strep by PCR: NOT DETECTED

## 2021-08-06 MED ORDER — FLUTICASONE FUROATE-VILANTEROL 100-25 MCG/ACT IN AEPB
1.0000 | INHALATION_SPRAY | Freq: Every day | RESPIRATORY_TRACT | Status: DC
  Filled 2021-08-06: qty 28

## 2021-08-06 MED ORDER — ASPIRIN EC 81 MG PO TBEC
81.0000 mg | DELAYED_RELEASE_TABLET | Freq: Every day | ORAL | Status: DC
  Administered 2021-08-07 – 2021-08-12 (×6): 81 mg via ORAL
  Filled 2021-08-06 (×6): qty 1

## 2021-08-06 MED ORDER — ACETAMINOPHEN 325 MG PO TABS: 650.0000 mg | ORAL_TABLET | Freq: Four times a day (QID) | ORAL | Status: AC | PRN

## 2021-08-06 MED ORDER — MAGNESIUM SULFATE 2 GM/50ML IV SOLN
2.0000 g | Freq: Once | INTRAVENOUS | Status: AC
  Administered 2021-08-06: 2 g via INTRAVENOUS
  Filled 2021-08-06: qty 50

## 2021-08-06 MED ORDER — IPRATROPIUM-ALBUTEROL 0.5-2.5 (3) MG/3ML IN SOLN
3.0000 mL | Freq: Once | RESPIRATORY_TRACT | Status: AC
  Administered 2021-08-06: 3 mL via RESPIRATORY_TRACT
  Filled 2021-08-06: qty 3

## 2021-08-06 MED ORDER — SODIUM CHLORIDE 0.9 % IV SOLN
1.0000 g | Freq: Once | INTRAVENOUS | Status: AC
  Administered 2021-08-06: 1 g via INTRAVENOUS
  Filled 2021-08-06: qty 10

## 2021-08-06 MED ORDER — ONDANSETRON HCL 4 MG PO TABS
4.0000 mg | ORAL_TABLET | Freq: Four times a day (QID) | ORAL | Status: DC | PRN
Start: 2021-08-06 — End: 2021-08-12

## 2021-08-06 MED ORDER — METHYLPREDNISOLONE SODIUM SUCC 40 MG IJ SOLR: 40.0000 mg | Freq: Two times a day (BID) | INTRAMUSCULAR | Status: DC

## 2021-08-06 MED ORDER — DOCUSATE SODIUM 100 MG PO CAPS
100.0000 mg | ORAL_CAPSULE | Freq: Two times a day (BID) | ORAL | Status: DC
  Administered 2021-08-06 – 2021-08-12 (×12): 100 mg via ORAL
  Filled 2021-08-06 (×12): qty 1

## 2021-08-06 MED ORDER — IPRATROPIUM-ALBUTEROL 0.5-2.5 (3) MG/3ML IN SOLN
3.0000 mL | Freq: Four times a day (QID) | RESPIRATORY_TRACT | Status: AC
  Administered 2021-08-06 – 2021-08-08 (×8): 3 mL via RESPIRATORY_TRACT
  Filled 2021-08-06 (×9): qty 3

## 2021-08-06 MED ORDER — SIMVASTATIN 20 MG PO TABS
20.0000 mg | ORAL_TABLET | Freq: Every day | ORAL | Status: DC
  Administered 2021-08-06 – 2021-08-11 (×6): 20 mg via ORAL
  Filled 2021-08-06: qty 2
  Filled 2021-08-06 (×5): qty 1

## 2021-08-06 MED ORDER — LEVOTHYROXINE SODIUM 25 MCG PO TABS
25.0000 ug | ORAL_TABLET | Freq: Every day | ORAL | Status: DC
  Administered 2021-08-07 – 2021-08-12 (×6): 25 ug via ORAL
  Filled 2021-08-06 (×6): qty 1

## 2021-08-06 MED ORDER — HYDROCODONE-ACETAMINOPHEN 5-325 MG PO TABS
1.0000 | ORAL_TABLET | Freq: Three times a day (TID) | ORAL | Status: DC | PRN
  Administered 2021-08-06 – 2021-08-12 (×12): 1 via ORAL
  Filled 2021-08-06 (×12): qty 1

## 2021-08-06 MED ORDER — TORSEMIDE 20 MG PO TABS
40.0000 mg | ORAL_TABLET | Freq: Two times a day (BID) | ORAL | Status: DC
  Administered 2021-08-06 – 2021-08-12 (×12): 40 mg via ORAL
  Filled 2021-08-06 (×12): qty 2

## 2021-08-06 MED ORDER — ACETAMINOPHEN 650 MG RE SUPP: 650.0000 mg | Freq: Four times a day (QID) | RECTAL | Status: AC | PRN

## 2021-08-06 MED ORDER — DIGOXIN 250 MCG PO TABS
0.2500 mg | ORAL_TABLET | Freq: Every day | ORAL | Status: DC
  Administered 2021-08-07 – 2021-08-10 (×4): 0.25 mg via ORAL
  Filled 2021-08-06 (×6): qty 1

## 2021-08-06 MED ORDER — ROFLUMILAST 500 MCG PO TABS
500.0000 ug | ORAL_TABLET | Freq: Every day | ORAL | Status: DC
  Administered 2021-08-07 – 2021-08-12 (×6): 500 ug via ORAL
  Filled 2021-08-06 (×7): qty 1

## 2021-08-06 MED ORDER — SERTRALINE HCL 50 MG PO TABS
200.0000 mg | ORAL_TABLET | Freq: Every morning | ORAL | Status: DC
  Administered 2021-08-07 – 2021-08-12 (×6): 200 mg via ORAL
  Filled 2021-08-06 (×6): qty 4

## 2021-08-06 MED ORDER — UMECLIDINIUM BROMIDE 62.5 MCG/ACT IN AEPB
1.0000 | INHALATION_SPRAY | Freq: Every day | RESPIRATORY_TRACT | Status: DC
  Filled 2021-08-06: qty 7

## 2021-08-06 MED ORDER — SODIUM CHLORIDE 0.9 % IV SOLN
500.0000 mg | INTRAVENOUS | Status: AC
  Administered 2021-08-07 – 2021-08-08 (×2): 500 mg via INTRAVENOUS
  Filled 2021-08-06 (×2): qty 500

## 2021-08-06 MED ORDER — ONDANSETRON HCL 4 MG/2ML IJ SOLN: 4.0000 mg | Freq: Four times a day (QID) | INTRAMUSCULAR | Status: DC | PRN

## 2021-08-06 MED ORDER — SODIUM CHLORIDE 0.9 % IV SOLN
500.0000 mg | Freq: Once | INTRAVENOUS | Status: AC
  Administered 2021-08-06: 500 mg via INTRAVENOUS
  Filled 2021-08-06: qty 500

## 2021-08-06 MED ORDER — ENOXAPARIN SODIUM 40 MG/0.4ML IJ SOSY
40.0000 mg | PREFILLED_SYRINGE | Freq: Every day | INTRAMUSCULAR | Status: DC
  Administered 2021-08-06 – 2021-08-11 (×6): 40 mg via SUBCUTANEOUS
  Filled 2021-08-06 (×6): qty 0.4

## 2021-08-06 MED ORDER — NICOTINE 21 MG/24HR TD PT24: 21.0000 mg | MEDICATED_PATCH | Freq: Every day | TRANSDERMAL | Status: AC | PRN

## 2021-08-06 MED ORDER — CEFTRIAXONE SODIUM 2 G IJ SOLR
2.0000 g | INTRAMUSCULAR | Status: AC
  Administered 2021-08-07 – 2021-08-10 (×4): 2 g via INTRAVENOUS
  Filled 2021-08-06: qty 20
  Filled 2021-08-06 (×3): qty 2

## 2021-08-06 MED ORDER — FLUTICASONE-UMECLIDIN-VILANT 100-62.5-25 MCG/INH IN AEPB: 1.0000 | INHALATION_SPRAY | Freq: Every day | RESPIRATORY_TRACT | Status: DC

## 2021-08-06 MED ORDER — BUTALBITAL-APAP-CAFFEINE 50-325-40 MG PO TABS
1.0000 | ORAL_TABLET | Freq: Once | ORAL | Status: AC
  Administered 2021-08-06: 1 via ORAL
  Filled 2021-08-06: qty 1

## 2021-08-06 MED ORDER — METHYLPREDNISOLONE SODIUM SUCC 125 MG IJ SOLR
125.0000 mg | Freq: Once | INTRAMUSCULAR | Status: AC
  Administered 2021-08-06: 125 mg via INTRAVENOUS
  Filled 2021-08-06: qty 2

## 2021-08-06 NOTE — ED Provider Notes (Signed)
[facility] Emergency Department Provider Note  ____________________________________________   I have reviewed the triage vital signs and the nursing notes.   HISTORY  Chief Complaint Shortness of Breath   History limited by: Not Limited   HPI Theresa [name] is a 65 y.o. female who presents to the emergency department today because of concerns for shortness of breath.  Patient states it is gotten worse over the past 4 to 5 days.  She does have history of COPD and is typically on 2 L oxygen at baseline.  She has noticed over the past couple days she has had to increase her oxygen requirement.  She finds that her shortness of breath is worse with exertion.  She has tried her breathing treatments at home with only minimal relief.  Patient does feel like she has been running fevers.  Records reviewed. Per medical record review patient has a history of COPD, CHF.  Past Medical History:  Diagnosis Date   Acute postoperative pain 10/03/2017   Anxiety    BiPAP (biphasic positive airway pressure) dependence    at hs   Carpal tunnel syndrome    CHF (congestive heart failure) ([place])    1/18   CHF (congestive heart failure) (H[facility])    Chronic generalized abdominal pain    Chronic rhinitis    COPD (chronic obstructive pulmonary disease) (HCC)    DDD (degenerative disc disease), cervical    DDD (degenerative disc disease), lumbosacral    Depression    Dyspnea    Edema    Flu    1/18   Gastritis    GERD (gastroesophageal reflux disease)    Hematuria    Hernia of abdominal wall 07/17/2018   Hernia, abdominal    Hypertension    Kidney stones    Low back pain    Lumbar radiculitis    Malodorous urine    Muscle weakness    Obesity    Oxygen dependent    Renal cyst    Sensory urge incontinence    Thyroid activity decreased    9/19   Tobacco abuse    Wheezing     Patient Active Problem List   Diagnosis Date Noted   Chronic shoulder pain (Bilateral)  05/05/2021   Chronic use of opiate for therapeutic purpose 01/26/2021   Left thigh pain 75/17/0017   Uncomplicated opioid dependence ([place]) 04/08/2020   Pharmacologic therapy 04/08/2020   Pneumonia due to COVID-19 virus 02/03/2020   Numbness and tingling in right hand 12/31/2019   Numbness in feet 12/31/2019   Altered consciousness 11/26/2019   Frequent falls 11/26/2019   Weakness of both lower extremities 11/26/2019   Lumbar disc extrusion (L2-3) (Left) 09/10/2019   Chronic knee pain (Left) 08/14/2019   Chronic left-sided lumbar radiculopathy 08/14/2019   Abnormal MRI, lumbar spine (07/23/2019) 08/14/2019   Acute on chronic respiratory failure with hypoxia ([place]) 07/06/2019   Cervicalgia (Bilateral) (R>L) 09/24/2018   Spondylosis without myelopathy or radiculopathy, lumbosacral region 09/24/2018   Epigastric hernia 05/04/2018   Chronic lower extremity pain (Bilateral) (L>R) 01/25/2018   Altered mental status, unspecified 12/09/2017   Chronic hip pain (3ry area of Pain) (Bilateral) (L>R) 11/27/2017   COPD (chronic obstructive pulmonary disease) ([place]) 08/11/2017   Other long term (current) drug therapy 08/08/2017   Disorder of bone, unspecified 08/08/2017   COPD with hypoxia ([place]) 06/27/2017   Arthralgia of acromioclavicular joint (Right) 06/15/2017   Acromioclavicular joint DJD (Right) 06/15/2017   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  Chronic shoulder pain (Left) 05/11/2017   Elevated brain natriuretic peptide (BNP) level 05/11/2017   Grief at loss of child 02/14/2017   Lumbar facet hypertrophy (multilevel) 11/17/2016   Lumbar spinal stenosis (L4-5) 11/17/2016   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) 11/17/2016   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) 11/17/2016   Lumbosacral radiculopathy at L4 11/17/2016   Supplemental oxygen dependent 08/01/2016   Sleep apnea 06/02/2016   Long term current use of opiate analgesic 01/12/2016   Long term  prescription opiate use 01/12/2016   Opiate use (15 MME/Day) 01/12/2016   Lumbar facet syndrome (Bilateral) (L>R) 01/12/2016   Chronic sacroiliac joint pain (Bilateral) 01/12/2016   Vitamin D deficiency 12/30/2015   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) 12/28/2015   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   Coagulation disorder ([place]) 10/22/2015   Chronic pain syndrome (significant psychosocial component) 09/21/2015   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 09/21/2015   Encounter for therapeutic drug level monitoring 09/16/2015   Encounter for chronic pain management 09/16/2015   Pain management 09/16/2015   Neurogenic pain 09/16/2015   Neuropathic pain 09/16/2015   Musculoskeletal pain 09/16/2015   Lumbar spondylosis (L4-5) 09/16/2015   Chronic low back pain (1ry area of Pain) (Bilateral) (L>R) w/o sciatica 09/16/2015   Chronic lower extremity pain (2ry area of Pain) (Left) 09/16/2015   Chronic lumbar radicular pain (L4 & S1 Dermatome) (Left) 09/16/2015   Chronic shoulder pain (Right side) 09/16/2015   Chronic upper extremity pain (Right-sided) 09/16/2015   Cervical radiculitis (Right side) 09/16/2015   Abnormal x-ray of lumbar spine 09/16/2015   Chronic diastolic heart failure ([place]) 07/15/2015   Chest pain 07/15/2015   Tobacco abuse 07/15/2015   Tendinitis 04/21/2015   Blood clotting disorder ([place]) 04/21/2015   Decreased motor strength 07/16/2014   Clinical depression 07/16/2014   Chronic rhinitis 07/16/2014   Carpal tunnel syndrome 07/16/2014   Anxiety state 07/16/2014   Nausea without vomiting 07/16/2014   Edema 07/16/2014   Generalized muscle weakness 07/16/2014   Neuritis or radiculitis due to rupture of lumbar intervertebral disc 07/02/2014   DDD (degenerative disc disease), lumbar 07/02/2014   CAFL (chronic airflow limitation) ([place]) 02/27/2014   Herpes zoster 07/16/2009   Essential (primary) hypertension 03/27/2008    Past  Surgical History:  Procedure Laterality Date   ABDOMINAL HYSTERECTOMY     CARPAL TUNNEL RELEASE Left 2012   EPIGASTRIC HERNIA REPAIR N/A 08/13/2018   HERNIA REPAIR EPIGASTRIC ADULT; polypropylene mesh reinforcement.  Surgeon: Robert Bellow, MD;  Location: [facility];  Service: General;  Laterality: N/A;   RIGHT/LEFT HEART CATH AND CORONARY ANGIOGRAPHY N/A 07/11/2019   Procedure: RIGHT/LEFT HEART CATH AND CORONARY ANGIOGRAPHY;  Surgeon: Yolonda Kida, MD;  Location: [facility];  Service: Cardiovascular;  Laterality: N/A;   TONSILLECTOMY     TUBAL LIGATION     UMBILICAL HERNIA REPAIR N/A 08/13/2018   HERNIA REPAIR UMBILICAL ADULT; polypropylene mesh reinforcement Surgeon: Robert Bellow, MD;  Location: [facility];  Service: General;  Laterality: N/A;    Prior to Admission medications   Medication Sig Start Date End Date Taking? Authorizing Provider  albuterol (VENTOLIN HFA) 108 (90 Base) MCG/ACT inhaler Inhale 2 puffs into the lungs every 6 (six) hours as needed for wheezing or shortness of breath. 10/23/19   Rudene Re, MD  aspirin EC 81 MG tablet Take 81 mg by mouth daily.    [provider]  baclofen (LIORESAL) 10 MG tablet Take 10  mg by mouth 3 (three) times daily.    [provider]  Cholecalciferol 50 MCG (2000 UT) CAPS Take 2,000 Units by mouth daily.     [provider]  diazepam (VALIUM) 10 MG tablet Take 1 tablet (10 mg total) by mouth 60 (sixty) minutes before procedure for 1 dose. Take with a sip of water, on an empty stomach. Do not eat anything for 6 hours prior to procedure. Patient not taking: Reported on 07/28/2021 05/05/21   Milinda Pointer, MD  digoxin (LANOXIN) 0.25 MG tablet TAKE 1 TABLET(0.25 MG) BY MOUTH DAILY 07/08/21   Chrismon, Vickki Muff, PA-C  doxycycline (VIBRA-TABS) 100 MG tablet Take 1 tablet (100 mg total) by mouth 2 (two) times daily. Patient not taking: Reported on 07/28/2021 06/22/21   Chrismon, Vickki Muff, PA-C   Fluticasone-Umeclidin-Vilant 100-62.5-25 MCG/INH AEPB Inhale 1 puff into the lungs daily. 10/03/18   [provider]  gabapentin (NEURONTIN) 400 MG capsule Take 1 capsule (400 mg total) by mouth 3 (three) times daily. 07/11/20   Milinda Pointer, MD  HYDROcodone-acetaminophen (NORCO/VICODIN) 5-325 MG tablet Take 1 tablet by mouth every 8 (eight) hours as needed for severe pain. Must last 30 days 08/03/21 09/02/21  Milinda Pointer, MD  HYDROcodone-acetaminophen (NORCO/VICODIN) 5-325 MG tablet Take 1 tablet by mouth every 8 (eight) hours as needed for severe pain. Must last 30 days 09/02/21 10/02/21  Milinda Pointer, MD  HYDROcodone-acetaminophen (NORCO/VICODIN) 5-325 MG tablet Take 1 tablet by mouth every 8 (eight) hours as needed for severe pain. Must last 30 days 10/02/21 11/01/21  Milinda Pointer, MD  ipratropium-albuterol (DUONEB) 0.5-2.5 (3) MG/3ML SOLN USE 1 VIAL VIA NEBULIZER EVERY 6 HOURS AS NEEDED FOR SHORTNESS OF BREATH OR WHEEZING 11/30/20   Chrismon, Vickki Muff, PA-C  levothyroxine (SYNTHROID) 25 MCG tablet TAKE 1 TABLET(25 MCG) BY MOUTH DAILY BEFORE BREAKFAST 06/10/21   Chrismon, Vickki Muff, PA-C  OXYGEN Inhale 2.5 L/min into the lungs.    [provider]  potassium chloride SA (KLOR-CON) 20 MEQ tablet TAKE 1 TABLET(20 MEQ) BY MOUTH TWICE DAILY 07/26/21   Darylene Price A, FNP  predniSONE (DELTASONE) 50 MG tablet Take 1 pill, 75m daily for 5 days Patient not taking: Reported on 07/28/2021 06/23/21   MRada Hay MD  Roflumilast (DALIRESP) 250 MCG TABS Take 500 mcg by mouth daily as needed.    [provider]  sertraline (ZOLOFT) 100 MG tablet TAKE 2 TABLETS BY MOUTH EVERY MORNING 07/01/21   FBirdie Sons MD  simvastatin (ZOCOR) 20 MG tablet Take 1 tablet (20 mg total) by mouth at bedtime. 06/22/21   Chrismon, DVickki Muff PA-C  tolterodine (DETROL LA) 4 MG 24 hr capsule Take 1 capsule (4 mg total) by mouth daily. 07/08/21 08/07/21  EHarlin Heys MD   torsemide (DEMADEX) 20 MG tablet Take 2 tablets (40 mg total) by mouth 2 (two) times daily. 07/26/21   HAlisa Graff FNP    Allergies Codeine and Meloxicam  Family History  Problem Relation Age of Onset   Heart disease Mother    Stroke Mother    Coronary artery disease Mother    Lung cancer Sister    Cancer Sister    Breast cancer Sister    Alcohol abuse Father    Heart disease Father    Cancer Brother    Cancer Brother    Pneumonia Brother    Prostate cancer Neg Hx    Kidney cancer Neg Hx    Bladder Cancer Neg Hx  Social History Social History   Tobacco Use   Smoking status: Every Day    Packs/day: 0.50    Years: 44.00    Pack years: 22.00    Types: Cigarettes   Smokeless tobacco: Never   Tobacco comments:    over a half pack  Vaping Use   Vaping Use: Never used  Substance Use Topics   Alcohol use: No   Drug use: No    Review of Systems Constitutional: No fever/chills Eyes: No visual changes. ENT: No sore throat. Cardiovascular: Positive for chest tightness. Respiratory: Positive for shortness of breath. Gastrointestinal: No abdominal pain.  No nausea, no vomiting.  No diarrhea.   Genitourinary: Negative for dysuria. Musculoskeletal: Negative for back pain. Skin: Negative for rash. Neurological: Negative for headaches, focal weakness or numbness.  ____________________________________________   PHYSICAL EXAM:  VITAL SIGNS: ED Triage Vitals  Enc Vitals Group     BP 08/06/21 1536 115/70     Pulse Rate 08/06/21 1536 (!) 104     Resp 08/06/21 1536 (!) 25     Temp 08/06/21 1536 99.2 F (37.3 C)     Temp Source 08/06/21 1536 Oral     SpO2 08/06/21 1536 93 %     Weight 08/06/21 1539 175 lb (79.4 kg)     Height 08/06/21 1539 _0  (1.575 m)     Head Circumference --      Peak Flow --      Pain Score 08/06/21 1547 4   Constitutional: Alert and oriented.  Eyes: Conjunctivae are normal.  ENT      Head: Normocephalic and atraumatic.       Nose: No congestion/rhinnorhea.      Mouth/Throat: Mucous membranes are moist.      Neck: No stridor. Hematological/Lymphatic/Immunilogical: No cervical lymphadenopathy. Cardiovascular: Normal rate, regular rhythm.  No murmurs, rubs, or gallops.  Respiratory: Tachypnea, increased respiratory effort. Diffuse expiratory wheezing. Gastrointestinal: Soft and non tender. No rebound. No guarding.  Genitourinary: Deferred Musculoskeletal: Normal range of motion in all extremities. No lower extremity edema. Neurologic:  Normal speech and language. No gross focal neurologic deficits are appreciated.  Skin:  Skin is warm, dry and intact. No rash noted. Psychiatric: Mood and affect are normal. Speech and behavior are normal. Patient exhibits appropriate insight and judgment.  ____________________________________________    LABS (pertinent positives/negatives)  BNP 36.1 COVID/flu negative BMP wnl except cl 97, glu 127 CBC wbc 16.4, hgb 16.2, plt 168  ____________________________________________   EKG  I, Nance Pear, attending physician, personally viewed and interpreted this EKG  EKG Time: 1542 Rate: 103 Rhythm: sinus tachycardia Axis: normal Intervals: qtc 489 QRS: narrow, q waves v1, v2 ST changes: no st elevation Impression: abnormal ekg  ____________________________________________    RADIOLOGY  CXR Chronic bronchitic changes, cannot rule out superimposed acute infection  ____________________________________________   PROCEDURES  Procedures  ____________________________________________   INITIAL IMPRESSION / ASSESSMENT AND PLAN / ED COURSE  Pertinent labs & imaging results that were available during my care of the patient were reviewed by me and considered in my medical decision making (see chart for details).  Patient presented to the emergency department today because of concerns for shortness of breath over the past 4 to 5 days.  Patient is requiring  higher dose of oxygen than her baseline.  Does have diffuse wheezing.  Work-up here is concerning for possible pneumonia with leukocytosis.  Discussed findings with patient.  Will plan on admission.  Will start IV antibiotics and give  steroids and duo nebs here in the emergency department. ____________________________________________   FINAL CLINICAL IMPRESSION(S) / ED DIAGNOSES  Final diagnoses:  COPD exacerbation ([place])  Hypoxia  Community acquired pneumonia, unspecified laterality     Note: This dictation was prepared with Sales executive. Any transcriptional errors that result from this process are unintentional     Nance Pear, MD 08/06/21 1735

## 2021-08-06 NOTE — H&P (Addendum)
History and Physical   Theresa [name] NKN:[id number] DOB: November 03, 1955 DOA: 08/06/2021  PCP: Merryl Hacker, No  Outpatient Specialists: Dr. Raul Del, pulmonologist Patient coming from: [place] urgent [facility], via [place]  I have personally briefly reviewed patient's old medical records in [place].  Chief Concern: Shortness of breath  HPI: Theresa [name] is a 65 y.o. female with medical history significant for hypothyroid, COPD, tobacco use disorder, anxiety, depression, primary hypertension, pulmonary artery hypertension, sleep apnea, chronic diastolic heart failure,who presents emergency department for chief concerns of shortness of breath for 4 days.  Four days ago, she developed shortness of breath. She does not remember what she was doing. She was coughing about 5 days. She reports increased mucus/phlegm production that is light yellow. She was at [facility], she was loading the items in her car. She denies known sick contacts.   She reports that on 08/04/2021, patient was ambulating from bedroom to kitchen, she thought she couldn't make it.  She mostly sat in a chair all day yesterday.  This prompted her to present to the ED.  Of note, she took care of her 5 year grandchild on Sunday and her grandchild slept over on Sunday and Monday night.   Social history: She lives at home with her husband.  She endorses current tobacco use, approximately 1+ pack/day.  At her peak she was smoking about 2 packs/day.  She started smoking at age 60.  She denies EtOH and recreational drug use.  ROS: Constitutional: no weight change, no fever ENT/Mouth: no sore throat, no rhinorrhea Eyes: no eye pain, no vision changes Cardiovascular: no chest pain, + dyspnea,  no edema, no palpitations Respiratory: + cough, + sputum, + wheezing Gastrointestinal: no nausea, no vomiting, no diarrhea, no constipation Genitourinary: no urinary incontinence, no dysuria, no hematuria Musculoskeletal: no arthralgias, no  myalgias Skin: no skin lesions, no pruritus, Neuro: + weakness, no loss of consciousness, no syncope Psych: no anxiety, no depression, + decrease appetite Heme/Lymph: no bruising, no bleeding  ED Course: Discussed with emergency medicine provider, patient requiring hospitalization for chief concerns of hypoxia presumed secondary to COPD exacerbation.  Vitals in the emergency department was remarkable for temperature of 99.2, respiration rate of 25 and improved to 19, initial heart rate of 104 and improved to 88, blood pressure 115/70, SPO2 of 93% on 2 L nasal cannula.  Labs in the emergency department was remarkable for sodium 138, potassium 3.6, chloride 93, bicarb 28, BUN of 10, serum creatinine of 0.72, nonfasting blood glucose 127, WBC 16.4, hemoglobin 16.2, platelets 168.  COVID/influenza A/influenza B PCR were negative.  In the emergency department patient was ordered 2 doses of duo nebs, Fioricet, Solu-Medrol 125 mg IV, azithromycin and ceftriaxone IV.  Per ED nursing note in triage, patient is from [facility] urgent care center and reported shortness of breath for 4 days.  Patient stated her SPO2 was in the mid 80s at home on baseline 2 L nasal cannula.  Assessment/Plan  Principal Problem:   COPD exacerbation (HCC) Active Problems:   Essential (primary) hypertension   DDD (degenerative disc disease), lumbar   Anxiety state   Tobacco abuse   Chronic pain syndrome (significant psychosocial component)   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HCC)   Sleep apnea   COPD with hypoxia (HCC)   Acute on chronic respiratory failure with hypoxia (HCC)   Uncomplicated opioid dependence ([place])   Leukocytosis   Encounter for tobacco use cessation counseling   # Acute hypoxic respiratory failure COPD # COPD -  Etiology work-up in progress, presumed secondary to COPD exacerbation in setting of possible viral infection from granddaughter - DuoNebs 4 times daily scheduled, Solu-Medrol 40 mg  IV twice daily - Resumed home maintenance fluticasone-umeclidinium-vilant, 1 puff inhalation daily, Roflumilast 500 mcg daily as needed for shortness of breath - Incentive spirometry, flutter valve - Check procalcitonin, strep by PCR - Admit to step down, inpatient, telemetry for 24 hours  # Chest pain- this developed while she was in the emergency department, HS troponin ordered at this time, persistent - If elevated, we will start heparin GTT in a.m. team can consult cardiology if needed - Discussed with cross coverage provider  # Hypothyroid-resumed home levothyroxine 25 mcg daily  # Anxiety/depression-resumed sertraline 200 mg p.o. daily  # Hyperlipidemia-simvastatin 20 mg nightly resumed  # Heart failure preserved ejection fraction-resumed home digoxin 0.25 mg p.o. daily starting on 08/07/2021 - Patient is compensated - No clinical suspicion for heart failure exacerbation - Pharmacy is requesting to check digoxin level  # Sleep apnea  # PAH-BiPAP nightly ordered, she endorses compliance with BiPAP nightly  # Tobacco use disorder-nicotine patch ordered Patient endorses readiness to stop tobacco use. - Greater than 10 minutes spent on tobacco cessation counseling  # Tobacco cessation counseling:  Week one, smoke 19 cigarettes per day. Week two, smoke 18 cigarettes per day. Week three, smoke 17 cigarettes per day, continue until smoking half the amount of cigarettes per day Discuss with PCP for pharmacologic assistance with smoking cessation Clean all indoor clothing, sheets, blankets, and freshen textile furniture to rid the smell of cigarettes Only smoke outside and wear outer covering Leave cigarettes and lighters outside in separate places During in-between cigarettes, if you feel the urge to smoke, use the following: stress squeezing devices/phone a trusted friend to talk you through the urge/walk in a safe environment If missing the feeling of holding a cigarette, cut a  sipping straw to the length of a cigarette and hold it between your fingers Call [place] if in need of nicotine patches to help with cessation  Chart reviewed.   Complete echo on 07/06/2021: Left ventricular ejection fraction, estimated at 60 to 65%.  Normal left ventricular posterior wall thickness.  There is no left ventricular hypertrophy.  Global right ventricle has normal systolic function.  Moderately elevated pulmonary artery systolic pressure.  DVT prophylaxis: Enoxaparin 40 mg subcutaneous nightly Code Status: Full code Diet: Heart healthy Family Communication: Updated husband, Ovid Curd at bedside Disposition Plan: Pending clinical course Consults called: None at this time Admission status: stepdown, inpatient, telemetry  Past Medical History:  Diagnosis Date   Acute postoperative pain 10/03/2017   Anxiety    BiPAP (biphasic positive airway pressure) dependence    at hs   Carpal tunnel syndrome    CHF (congestive heart failure) ([place])    1/18   CHF (congestive heart failure) (HCC)    Chronic generalized abdominal pain    Chronic rhinitis    COPD (chronic obstructive pulmonary disease) (HCC)    DDD (degenerative disc disease), cervical    DDD (degenerative disc disease), lumbosacral    Depression    Dyspnea    Edema    Flu    1/18   Gastritis    GERD (gastroesophageal reflux disease)    Hematuria    Hernia of abdominal wall 07/17/2018   Hernia, abdominal    Hypertension    Kidney stones    Low back pain    Lumbar radiculitis  Malodorous urine    Muscle weakness    Obesity    Oxygen dependent    Renal cyst    Sensory urge incontinence    Thyroid activity decreased    9/19   Tobacco abuse    Wheezing    Past Surgical History:  Procedure Laterality Date   ABDOMINAL HYSTERECTOMY     CARPAL TUNNEL RELEASE Left 2012   EPIGASTRIC HERNIA REPAIR N/A 08/13/2018   HERNIA REPAIR EPIGASTRIC ADULT; polypropylene mesh reinforcement.  Surgeon: Robert Bellow, MD;  Location: [facility] ORS;  Service: General;  Laterality: N/A;   RIGHT/LEFT HEART CATH AND CORONARY ANGIOGRAPHY N/A 07/11/2019   Procedure: RIGHT/LEFT HEART CATH AND CORONARY ANGIOGRAPHY;  Surgeon: Yolonda Kida, MD;  Location: [place] [facility];  Service: Cardiovascular;  Laterality: N/A;   TONSILLECTOMY     TUBAL LIGATION     UMBILICAL HERNIA REPAIR N/A 08/13/2018   HERNIA REPAIR UMBILICAL ADULT; polypropylene mesh reinforcement Surgeon: Robert Bellow, MD;  Location: [facility] ORS;  Service: General;  Laterality: N/A;   Social History:  reports that she has been smoking cigarettes. She has a 22.00 pack-year smoking history. She has never used smokeless tobacco. She reports that she does not drink alcohol and does not use drugs.  Allergies  Allergen Reactions   Codeine Nausea And Vomiting   Meloxicam Other (See Comments)    Stomach pain   Family History  Problem Relation Age of Onset   Heart disease Mother    Stroke Mother    Coronary artery disease Mother    Lung cancer Sister    Cancer Sister    Breast cancer Sister    Alcohol abuse Father    Heart disease Father    Cancer Brother    Cancer Brother    Pneumonia Brother    Prostate cancer Neg Hx    Kidney cancer Neg Hx    Bladder Cancer Neg Hx    Family history: Family history reviewed and not pertinent  Prior to Admission medications   Medication Sig Start Date End Date Taking? Authorizing Provider  albuterol (VENTOLIN HFA) 108 (90 Base) MCG/ACT inhaler Inhale 2 puffs into the lungs every 6 (six) hours as needed for wheezing or shortness of breath. 10/23/19   Rudene Re, MD  aspirin EC 81 MG tablet Take 81 mg by mouth daily.    [provider]  baclofen (LIORESAL) 10 MG tablet Take 10 mg by mouth 3 (three) times daily.    [provider]  Cholecalciferol 50 MCG (2000 UT) CAPS Take 2,000 Units by mouth daily.     [provider]  diazepam (VALIUM) 10 MG tablet Take 1 tablet (10 mg  total) by mouth 60 (sixty) minutes before procedure for 1 dose. Take with a sip of water, on an empty stomach. Do not eat anything for 6 hours prior to procedure. Patient not taking: Reported on 07/28/2021 05/05/21   Milinda Pointer, MD  digoxin (LANOXIN) 0.25 MG tablet TAKE 1 TABLET(0.25 MG) BY MOUTH DAILY 07/08/21   Chrismon, Vickki Muff, PA-C  doxycycline (VIBRA-TABS) 100 MG tablet Take 1 tablet (100 mg total) by mouth 2 (two) times daily. Patient not taking: Reported on 07/28/2021 06/22/21   Chrismon, Vickki Muff, PA-C  Fluticasone-Umeclidin-Vilant 100-62.5-25 MCG/INH AEPB Inhale 1 puff into the lungs daily. 10/03/18   [provider]  gabapentin (NEURONTIN) 400 MG capsule Take 1 capsule (400 mg total) by mouth 3 (three) times daily. 07/11/20   Milinda Pointer, MD  HYDROcodone-acetaminophen (NORCO/VICODIN) 5-325 MG tablet Take 1 tablet by mouth every 8 (eight) hours as needed for severe pain. Must last 30 days 08/03/21 09/02/21  Milinda Pointer, MD  HYDROcodone-acetaminophen (NORCO/VICODIN) 5-325 MG tablet Take 1 tablet by mouth every 8 (eight) hours as needed for severe pain. Must last 30 days 09/02/21 10/02/21  Milinda Pointer, MD  HYDROcodone-acetaminophen (NORCO/VICODIN) 5-325 MG tablet Take 1 tablet by mouth every 8 (eight) hours as needed for severe pain. Must last 30 days 10/02/21 11/01/21  Milinda Pointer, MD  ipratropium-albuterol (DUONEB) 0.5-2.5 (3) MG/3ML SOLN USE 1 VIAL VIA NEBULIZER EVERY 6 HOURS AS NEEDED FOR SHORTNESS OF BREATH OR WHEEZING 11/30/20   Chrismon, Vickki Muff, PA-C  levothyroxine (SYNTHROID) 25 MCG tablet TAKE 1 TABLET(25 MCG) BY MOUTH DAILY BEFORE BREAKFAST 06/10/21   Chrismon, Vickki Muff, PA-C  OXYGEN Inhale 2.5 L/min into the lungs.    [provider]  potassium chloride SA (KLOR-CON) 20 MEQ tablet TAKE 1 TABLET(20 MEQ) BY MOUTH TWICE DAILY 07/26/21   Darylene Price A, FNP  predniSONE (DELTASONE) 50 MG tablet Take 1 pill, 2m daily for 5 days Patient  not taking: Reported on 07/28/2021 06/23/21   MRada Hay MD  Roflumilast (DALIRESP) 250 MCG TABS Take 500 mcg by mouth daily as needed.    [provider]  sertraline (ZOLOFT) 100 MG tablet TAKE 2 TABLETS BY MOUTH EVERY MORNING 07/01/21   FBirdie Sons MD  simvastatin (ZOCOR) 20 MG tablet Take 1 tablet (20 mg total) by mouth at bedtime. 06/22/21   Chrismon, DVickki Muff PA-C  tolterodine (DETROL LA) 4 MG 24 hr capsule Take 1 capsule (4 mg total) by mouth daily. 07/08/21 08/07/21  EHarlin Heys MD  torsemide (DEMADEX) 20 MG tablet Take 2 tablets (40 mg total) by mouth 2 (two) times daily. 07/26/21   HAlisa Graff FNP   Physical Exam: Vitals:   08/06/21 1536 08/06/21 1539 08/06/21 1700  BP: 115/70  108/69  Pulse: (!) 104  91  Resp: (!) 25  (!) 28  Temp: 99.2 F (37.3 C)    TempSrc: Oral    SpO2: 93%  92%  Weight:  79.4 kg   Height:  _0  (1.575 m)    Constitutional: appears older than chronological age, NAD, calm, mild discomfort Eyes: PERRL, lids and conjunctivae normal ENMT: Mucous membranes are moist. Posterior pharynx clear of any exudate or lesions. Age-appropriate dentition. Hearing appropriate Neck: normal, supple, no masses, no thyromegaly Respiratory: clear to auscultation bilaterally, + diffuse and expiratory wheezing, no crackles. Increased respiratory effort. Increased accessory muscle use.  Cardiovascular: Regular rate and rhythm, no murmurs / rubs / gallops. No extremity edema. 2+ pedal pulses. No carotid bruits.  Abdomen: Obese abdomen, no tenderness, no masses palpated, no hepatosplenomegaly. Bowel sounds positive.  Musculoskeletal: no clubbing / cyanosis. No joint deformity upper and lower extremities. Good ROM, no contractures, no atrophy. Normal muscle tone.  Skin: no rashes, lesions, ulcers. No induration Neurologic: Sensation intact. Strength 5/5 in all 4.  Psychiatric: Normal judgment and insight. Alert and oriented x 3. Normal mood.   EKG:  independently reviewed, showing sinus tachycardia with rate of 103, QTc 489  Chest x-ray on Admission: I personally reviewed and I agree with radiologist reading as below.  DG Chest 2 View  Result Date: 08/06/2021 CLINICAL DATA:  Dyspnea.  Productive cough.  COPD.  CHF. EXAM: CHEST - 2 VIEW COMPARISON:  Chest x-ray 06/23/2021, CT chest 06/23/2021. FINDINGS: The heart and mediastinal contours are  unchanged. Aortic calcification. Prominent pulmonary arteries. Hyperinflation of the lungs. No focal consolidation. Coarsened interstitial markings with no overt pulmonary edema. No pleural effusion. No pneumothorax. No acute osseous abnormality. IMPRESSION: 1. Chronic bronchitic lung changes with acute superimposed infection/inflammation not excluded. 2. Aortic Atherosclerosis (ICD10-I70.0) and Emphysema (ICD10-J43.9). 3. Persistent prominent pulmonary artery suggestive of pulmonary hypertension. Electronically Signed   By: Iven Finn M.D.   On: 08/06/2021 16:24    Labs on Admission: I have personally reviewed following labs  CBC: Recent Labs  Lab 08/06/21 1548  WBC 16.4*  NEUTROABS 14.3*  HGB 16.2*  HCT 47.1*  MCV 96.9  PLT 104   Basic Metabolic Panel: Recent Labs  Lab 08/06/21 1548  NA 138  K 3.6  CL 97*  CO2 28  GLUCOSE 127*  BUN 10  CREATININE 0.72  CALCIUM 8.9   GFR: Estimated Creatinine Clearance: 69.3 mL/min (by C-G formula based on SCr of 0.72 mg/dL).  Urine analysis:    Component Value Date/Time   COLORURINE YELLOW (A) 02/06/2020 2148   APPEARANCEUR CLEAR (A) 02/06/2020 2148   APPEARANCEUR Cloudy (A) 11/28/2016 1128   LABSPEC 1.009 02/06/2020 2148   LABSPEC 1.011 06/12/2014 2003   PHURINE 7.0 02/06/2020 2148   GLUCOSEU NEGATIVE 02/06/2020 2148   GLUCOSEU Negative 06/12/2014 2003   HGBUR NEGATIVE 02/06/2020 2148   BILIRUBINUR NEGATIVE 02/06/2020 2148   BILIRUBINUR Negative 10/30/2019 1516   BILIRUBINUR Negative 11/28/2016 1128   BILIRUBINUR Negative  06/12/2014 2003   KETONESUR NEGATIVE 02/06/2020 2148   PROTEINUR NEGATIVE 02/06/2020 2148   UROBILINOGEN 0.2 10/30/2019 1516   UROBILINOGEN 0.2 07/30/2013 1305   NITRITE NEGATIVE 02/06/2020 2148   LEUKOCYTESUR NEGATIVE 02/06/2020 2148   LEUKOCYTESUR Negative 06/12/2014 2003   CRITICAL CARE Performed by: Briant Cedar Anselm Aumiller  Total critical care time: 40 minutes  Critical care time was exclusive of separately billable procedures and treating other patients.  Critical care was necessary to treat or prevent imminent or life-threatening deterioration.  Critical care was time spent personally by me on the following activities: development of treatment plan with patient and/or surrogate as well as nursing, discussions with consultants, evaluation of patient's response to treatment, examination of patient, obtaining history from patient or surrogate, ordering and performing treatments and interventions, ordering and review of laboratory studies, ordering and review of radiographic studies, pulse oximetry and re-evaluation of patient's condition.  Dr. Tobie Poet [facility]  If 7PM-7AM, please contact overnight-coverage provider If 7AM-7PM, please contact day coverage provider [email or web address]  08/06/2021, 7:06 PM

## 2021-08-06 NOTE — ED Provider Notes (Signed)
Emergency Medicine Provider Triage Evaluation Note  Yanelis Osika , a 65 y.o. female  was evaluated in triage.  Pt complains of shortness of breath x 4 days with cough. On 2.5l of oxygen at home. Cough productive of clear sputum.  Review of Systems  Positive: Cough, shortness of breath Negative: Fever  Physical Exam  BP 115/70 (BP Location: Right Arm)   Pulse (!) 104   Temp 99.2 F (37.3 C) (Oral)   Resp (!) 25   Ht _0  (1.575 m)   Wt 79.4 kg   SpO2 93%   BMI 32.01 kg/m  Gen:   Awake, no distress   Resp:  Normal effort  MSK:   Moves extremities without difficulty  Other:    Medical Decision Making  Medically screening exam initiated at 3:45 PM.  Appropriate orders placed.  Nohemi Nicklaus was informed that the remainder of the evaluation will be completed by another provider, this initial triage assessment does not replace that evaluation, and the importance of remaining in the ED until their evaluation is complete.    Victorino Dike, FNP 08/06/21 1548    Lavonia Drafts, MD 08/09/21 [phone]

## 2021-08-06 NOTE — ED Triage Notes (Addendum)
Pt to ER via POV from [facility] with complaints of worsening shortness of breath x4 days. Reports she is usually on 2.5L via [place] chronic. At home today her O2 sats dropped to mid 80's on her baseline O2. Has also had a productive cough with clear sputum.   No known fevers at home.

## 2021-08-07 ENCOUNTER — Encounter: Payer: Self-pay | Admitting: Student

## 2021-08-07 DIAGNOSIS — Z716 Tobacco abuse counseling: Secondary | ICD-10-CM

## 2021-08-07 DIAGNOSIS — D72824 Basophilia: Secondary | ICD-10-CM

## 2021-08-07 DIAGNOSIS — F1120 Opioid dependence, uncomplicated: Secondary | ICD-10-CM

## 2021-08-07 DIAGNOSIS — J9601 Acute respiratory failure with hypoxia: Secondary | ICD-10-CM | POA: Diagnosis not present

## 2021-08-07 DIAGNOSIS — J449 Chronic obstructive pulmonary disease, unspecified: Secondary | ICD-10-CM | POA: Diagnosis not present

## 2021-08-07 DIAGNOSIS — F32A Depression, unspecified: Secondary | ICD-10-CM

## 2021-08-07 DIAGNOSIS — I2721 Secondary pulmonary arterial hypertension: Secondary | ICD-10-CM

## 2021-08-07 DIAGNOSIS — R0902 Hypoxemia: Secondary | ICD-10-CM

## 2021-08-07 DIAGNOSIS — F419 Anxiety disorder, unspecified: Secondary | ICD-10-CM

## 2021-08-07 DIAGNOSIS — J441 Chronic obstructive pulmonary disease with (acute) exacerbation: Secondary | ICD-10-CM | POA: Diagnosis not present

## 2021-08-07 DIAGNOSIS — G894 Chronic pain syndrome: Secondary | ICD-10-CM

## 2021-08-07 DIAGNOSIS — I10 Essential (primary) hypertension: Secondary | ICD-10-CM

## 2021-08-07 LAB — CBC
HCT: 47.3 % — ABNORMAL HIGH (ref 36.0–46.0)
Hemoglobin: 15.6 g/dL — ABNORMAL HIGH (ref 12.0–15.0)
MCH: 33 pg (ref 26.0–34.0)
MCHC: 33 g/dL (ref 30.0–36.0)
MCV: 100 fL (ref 80.0–100.0)
Platelets: 165 10*3/uL (ref 150–400)
RBC: 4.73 MIL/uL (ref 3.87–5.11)
RDW: 16.4 % — ABNORMAL HIGH (ref 11.5–15.5)
WBC: 9.6 10*3/uL (ref 4.0–10.5)
nRBC: 0 % (ref 0.0–0.2)

## 2021-08-07 LAB — BASIC METABOLIC PANEL
Anion gap: 10 (ref 5–15)
BUN: 15 mg/dL (ref 8–23)
CO2: 32 mmol/L (ref 22–32)
Calcium: 9 mg/dL (ref 8.9–10.3)
Chloride: 99 mmol/L (ref 98–111)
Creatinine, Ser: 0.61 mg/dL (ref 0.44–1.00)
GFR, Estimated: >60 mL/min (ref >60)
Glucose, Bld: 131 mg/dL — ABNORMAL HIGH (ref 70–99)
Potassium: 3.8 mmol/L (ref 3.5–5.1)
Sodium: 141 mmol/L (ref 135–145)

## 2021-08-07 LAB — HIV ANTIBODY (ROUTINE TESTING W REFLEX): HIV Screen 4th Generation wRfx: NONREACTIVE

## 2021-08-07 LAB — DIGOXIN LEVEL: Digoxin Level: 1.1 ng/mL (ref 0.8–2.0)

## 2021-08-07 MED ORDER — IPRATROPIUM-ALBUTEROL 0.5-2.5 (3) MG/3ML IN SOLN
3.0000 mL | RESPIRATORY_TRACT | Status: DC | PRN
  Administered 2021-08-07 – 2021-08-09 (×4): 3 mL via RESPIRATORY_TRACT
  Filled 2021-08-07 (×2): qty 3

## 2021-08-07 MED ORDER — BUDESONIDE 0.5 MG/2ML IN SUSP
0.5000 mg | Freq: Two times a day (BID) | RESPIRATORY_TRACT | Status: DC
  Administered 2021-08-07 – 2021-08-09 (×5): 0.5 mg via RESPIRATORY_TRACT
  Filled 2021-08-07 (×5): qty 2

## 2021-08-07 MED ORDER — FESOTERODINE FUMARATE ER 8 MG PO TB24
8.0000 mg | ORAL_TABLET | Freq: Every day | ORAL | Status: DC
  Administered 2021-08-07 – 2021-08-12 (×6): 8 mg via ORAL
  Filled 2021-08-07 (×6): qty 1

## 2021-08-07 MED ORDER — METHYLPREDNISOLONE SODIUM SUCC 125 MG IJ SOLR
80.0000 mg | Freq: Two times a day (BID) | INTRAMUSCULAR | Status: AC
  Administered 2021-08-07 – 2021-08-08 (×4): 80 mg via INTRAVENOUS
  Filled 2021-08-07 (×4): qty 2

## 2021-08-07 NOTE — Progress Notes (Signed)
PT Cancellation Note  Patient Details Name: Theresa [name] MRN: [id number] DOB: 1956/03/21   Cancelled Treatment:    Reason Eval/Treat Not Completed: Other (comment).  Pt has been desaturating, will try in the AM.   Ramond Dial 08/07/2021, 3:44 PM  Mee Hives, PT PhD Acute Rehab Dept. Number: [place] and [place]

## 2021-08-07 NOTE — ED Notes (Signed)
Report to [place], RN

## 2021-08-07 NOTE — ED Notes (Signed)
Pt up A&O x4. Pt removed from Bi-pap and placed on n/c @ 2.5l (pt baseline from home). NADN

## 2021-08-07 NOTE — Progress Notes (Signed)
PT Cancellation Note  Patient Details Name: Theresa [name] MRN: [id number] DOB: Feb 26, 1956   Cancelled Treatment:    Reason Eval/Treat Not Completed: Other (comment).  Off the floor to go to a room, retry at another time.   Ramond Dial 08/07/2021, 2:32 PM  Mee Hives, PT PhD Acute Rehab Dept. Number: [place] and [place]

## 2021-08-07 NOTE — ED Notes (Signed)
Breakfast meal tray given at this time.

## 2021-08-07 NOTE — Evaluation (Signed)
Occupational Therapy Evaluation Patient Details Name: Theresa [name] MRN: [id number] DOB: March 23, 1956 Today's Date: 08/07/2021   History of Present Illness Pt is a 65 y/o F with PMH: COPD (on chronic 2.5Lnc), h/o tobacco use, GERD, and CHF. She presented to ED d/t worsening SOB and w/u revealed elevated WBC. Pt adm with concerns for PNA With leukocytosis.   Clinical Impression   Pt seen for OT evaluation this date in setting of acute hospitalization with concerns for PNA. Pt reports being INDEP For ADLs and mobility at baseline, but endorses recently requiring increased help purely d/t decreased energy with increased SOB. Pt presents this date with decreased fxl activity tolerance limiting her ability to safety and efficiently perform ADLs/ADL mobility. Pt requires CGA/SBA for ADL transfers and mobility, somewhat unsteady, but appears r/t more d/t decreased tolerance versus true weakness. OT provides pt with the below-listed education and assists pt with dressing tasks. Pt requires SETUP for UB dressing and MIN A for LB dressing. OT demos figure-4 technique for energy conservation when performing LB Dressing. Pt left with all needs met and in reach. She required 4-5Lnc for exertional tasks in standing as her saturation was noted to drop to 84% if left on her baseline 2.5L. OT will continue to follow acutely. Do not anticipate need for OT services outside of acute setting.      Recommendations for follow up therapy are one component of a multi-disciplinary discharge planning process, led by the attending physician.  Recommendations may be updated based on patient status, additional functional criteria and insurance authorization.   Follow Up Recommendations  Other (comment) (could potentially benefit from cardiopulm OP rehab f/u)    Equipment Recommendations  3 in 1 bedside commode    Recommendations for Other Services       Precautions / Restrictions Precautions Precautions:  Fall Restrictions Weight Bearing Restrictions: No      Mobility Bed Mobility Overal bed mobility: Modified Independent                  Transfers Overall transfer level: Modified independent Equipment used: 1 person hand held assist Transfers: Sit to/from Stand Sit to Stand: Supervision;Min guard         General transfer comment: requires occasional hand held assistance when fatiguing. She is able to complete STS with MOD I initially, but then fatigued requiring CGA/SBA. Overall good eccentric/concentric control    Balance Overall balance assessment: Mild deficits observed, not formally tested                                         ADL either performed or assessed with clinical judgement   ADL                                         General ADL Comments: pt able to perform seated UB ADLs with INDEP. Requires SETUP/SUPV for seated LB ADLs to use modified technique for energy conservation.     Vision Patient Visual Report: No change from baseline       Perception     Praxis      Pertinent Vitals/Pain Pain Assessment: No/denies pain     Hand Dominance Right   Extremity/Trunk Assessment Upper Extremity Assessment Upper Extremity Assessment: Overall WFL for tasks assessed   Lower Extremity Assessment Lower  Extremity Assessment: Overall WFL for tasks assessed       Communication Communication Communication: No difficulties   Cognition Arousal/Alertness: Awake/alert Behavior During Therapy: WFL for tasks assessed/performed Overall Cognitive Status: Within Functional Limits for tasks assessed                                     General Comments       Exercises Other Exercises Other Exercises: OT engages pt in ed re: rationale for PLB, rationale for remembering to bring O2 back down to her baseline when she is resting after increasing for exertional tasks (decrease risk of CO2 retention), energy  conservation. Pt with good understanding and good attn with all education.   Shoulder Instructions      Home Living Family/patient expects to be discharged to:: Private residence Living Arrangements: Spouse/significant other;Children Available Help at Discharge: Family;Available 24 hours/day Type of Home: Mobile home Home Access: Stairs to enter Entrance Stairs-Number of Steps: 3 Entrance Stairs-Rails: Left Home Layout: One level     Bathroom Shower/Tub: Walk-in shower         Home Equipment: Environmental consultant - 2 wheels;Walker - 4 wheels;Shower seat          Prior Functioning/Environment Level of Independence: Independent with assistive device(s)        Comments: able to perform fxl mobility w/o AD, but does only tolerate short distances. She uses shower seat to bathe. on 2.5Lnc chronically.        OT Problem List: Decreased activity tolerance;Cardiopulmonary status limiting activity      OT Treatment/Interventions: Self-care/ADL training;Therapeutic exercise;Therapeutic activities;Energy conservation    OT Goals(Current goals can be found in the care plan section) Acute Rehab OT Goals Patient Stated Goal: to get breathing under better control and go home OT Goal Formulation: With patient Time For Goal Achievement: 08/21/21 Potential to Achieve Goals: Good  OT Frequency: Min 1X/week   Barriers to D/C:            Co-evaluation              AM-PAC OT "6 Clicks" Daily Activity     Outcome Measure Help from another person eating meals?: None Help from another person taking care of personal grooming?: None Help from another person toileting, which includes using toliet, bedpan, or urinal?: A Little Help from another person bathing (including washing, rinsing, drying)?: A Little Help from another person to put on and taking off regular upper body clothing?: None Help from another person to put on and taking off regular lower body clothing?: A Little 6 Click Score:  21   End of Session Equipment Utilized During Treatment: Oxygen Nurse Communication: Mobility status  Activity Tolerance: Patient tolerated treatment well Patient left: in bed;with call bell/phone within reach  OT Visit Diagnosis: Muscle weakness (generalized) (M62.81)                Time: 1771-1657 OT Time Calculation (min): 23 min Charges:  OT General Charges $OT Visit: 1 Visit OT Evaluation $OT Eval Moderate Complexity: 1 Mod OT Treatments $Self Care/Home Management : 8-22 mins  Gerrianne Scale, [place], OTR/L ascom [phone] 08/07/21, 3:15 PM

## 2021-08-07 NOTE — Progress Notes (Signed)
PROGRESS NOTE  Theresa [name] LPF:[id number] DOB: 1956-10-07   PCP: Pcp, No  Patient is from: Home  DOA: 08/06/2021 LOS: 1  Chief complaints:  Chief Complaint  Patient presents with   Shortness of Breath     Brief Narrative / Interim history: 's 65 year old F with PMH of COPD, tobacco use disorder, PAH, diastolic CHF, OSA on CPAP, HTN, hypothyroidism, anxiety and depression presenting with shortness of breath and cough and admitted for acute respiratory failure with hypoxia due to COPD exacerbation.  She was a started on IV antibiotics, systemic steroid and nebulizers.  WBC 16.4.  CXR with bronchitic changes and PAH.  BNP, serial troponin and Pro-Cal negative.  Subjective: Seen and examined earlier this morning.  No major events overnight of this morning.  Still with significant shortness of breath but better than when she came in.  Reports some abdominal pressure and heaviness.  She denies chest pain.   Objective: Vitals:   08/07/21 0900 08/07/21 1000 08/07/21 1224 08/07/21 1431  BP: 110/60 95/72 125/65 (!) 107/59  Pulse: 70 79 71 92  Resp: 20 (!) 23 (!) 22 20  Temp:    98.3 F (36.8 C)  TempSrc:    Oral  SpO2: 93% 99% 93% 93%  Weight:      Height:        Intake/Output Summary (Last 24 hours) at 08/07/2021 1556 Last data filed at 08/07/2021 1059 Gross per 24 hour  Intake 646.67 ml  Output --  Net 646.67 ml   Filed Weights   08/06/21 1539  Weight: 79.4 kg    Examination:  GENERAL: No apparent distress.  Nontoxic. HEENT: MMM.  Vision and hearing grossly intact.  NECK: Supple.  No apparent JVD.  RESP: 97% on 3 L but some work of breathing as she speaks.  Rhonchi and expiratory wheeze CVS:  RRR. Heart sounds normal.  ABD/GI/GU: BS+. Abd soft, NTND.  MSK/EXT:  Moves extremities. No apparent deformity. No edema.  SKIN: no apparent skin lesion or wound NEURO: Awake, alert and oriented appropriately.  No apparent focal neuro deficit. PSYCH: Calm. Normal affect.    Procedures:  None  Microbiology summarized: [id number] and influenza PCR nonreactive. Blood cultures pending.  Assessment & Plan: Acute respiratory failure with hypoxia due to COPD exacerbation-SOB and cough for 4 to 5 days.  Still with notable work of breathing, rhonchi and expiratory wheeze on exam.  CXR with bronchitic changes and signs of PAH.  BNP, serial troponin and Pro-Cal negative. -Continue antibiotics -Increase Solu-Medrol to 80 mg twice daily -Add Pulmicort.  Continue scheduled and as needed DuoNeb -Wean oxygen as able.  Minimum to keep saturation above 88% -Encouraged to quit smoking cigarettes -IS, OOB/PT/OT -Follow blood cultures -BiPAP at night  Chest pain: Had a chest pain while in ED but resolved.  BNP, serial troponin and EKG without acute finding.  Clinically low suspicion for PE. -Manage COPD exacerbation as above.  Pulmonary hypertension: R/LHC in 2020 with no significant obstructive CAD but moderate PAH with wedge pressure of 13 mmHg.  Likely cor pulmonale from underlying COPD.  Could be playing a role in patient's hypoxemia. -Consider TTE if no improvement -Continue home torsemide  Hypothyroidism -Continue home Synthroid  Anxiety/depression/chronic pain syndrome: Stable. -Continue home meds  OSA on CPAP -Continue nightly BiPAP.   Tobacco use disorder -Cessation counseling given. -Continue nicotine patch   Class I obesity Body mass index is 32.01 kg/m.         DVT prophylaxis:  enoxaparin (LOVENOX)  injection 40 mg Start: 08/06/21 2200 Place TED hose Start: 08/06/21 1759  Code Status: Full code Family Communication: Patient and/or RN. Available if any question.  Level of care: Med-Surg Status is: Inpatient  Remains inpatient appropriate because: Acute respiratory failure with hypoxia requiring supplemental oxygen, increased work of breathing and need for IV medication   Consultants:  None   Sch Meds:  Scheduled Meds:  aspirin EC   81 mg Oral Daily   budesonide (PULMICORT) nebulizer solution  0.5 mg Nebulization BID   digoxin  0.25 mg Oral QAC breakfast   docusate sodium  100 mg Oral BID   enoxaparin (LOVENOX) injection  40 mg Subcutaneous QHS   ipratropium-albuterol  3 mL Nebulization QID   levothyroxine  25 mcg Oral Q0600   methylPREDNISolone (SOLU-MEDROL) injection  80 mg Intravenous BID   roflumilast  500 mcg Oral Daily   sertraline  200 mg Oral q morning   simvastatin  20 mg Oral QHS   torsemide  40 mg Oral BID   Continuous Infusions:  azithromycin Stopped (08/07/21 1059)   cefTRIAXone (ROCEPHIN)  IV Stopped (08/07/21 1018)   PRN Meds:.acetaminophen **OR** acetaminophen, HYDROcodone-acetaminophen, ipratropium-albuterol, nicotine, ondansetron **OR** ondansetron (ZOFRAN) IV  Antimicrobials: Anti-infectives (From admission, onward)    Start     Dose/Rate Route Frequency Ordered Stop   08/07/21 1000  cefTRIAXone (ROCEPHIN) 2 g in sodium chloride 0.9 % 100 mL IVPB        2 g 200 mL/hr over 30 Minutes Intravenous Every 24 hours 08/06/21 1804 08/11/21 0959   08/07/21 1000  azithromycin (ZITHROMAX) 500 mg in sodium chloride 0.9 % 250 mL IVPB        500 mg 250 mL/hr over 60 Minutes Intravenous Every 24 hours 08/06/21 1804 08/09/21 0959   08/06/21 1730  azithromycin (ZITHROMAX) 500 mg in sodium chloride 0.9 % 250 mL IVPB        500 mg 250 mL/hr over 60 Minutes Intravenous  Once 08/06/21 1729 08/06/21 2015   08/06/21 1730  cefTRIAXone (ROCEPHIN) 1 g in sodium chloride 0.9 % 100 mL IVPB        1 g 200 mL/hr over 30 Minutes Intravenous  Once 08/06/21 1729 08/06/21 1827        I have personally reviewed the following labs and images: CBC: Recent Labs  Lab 08/06/21 1548 08/07/21 0646  WBC 16.4* 9.6  NEUTROABS 14.3*  --   HGB 16.2* 15.6*  HCT 47.1* 47.3*  MCV 96.9 100.0  PLT 168 165   BMP &GFR Recent Labs  Lab 08/06/21 1548 08/07/21 0646  NA 138 141  K 3.6 3.8  CL 97* 99  CO2 28 32  GLUCOSE  127* 131*  BUN 10 15  CREATININE 0.72 0.61  CALCIUM 8.9 9.0   Estimated Creatinine Clearance: 69.3 mL/min (by C-G formula based on SCr of 0.61 mg/dL). Liver & Pancreas: No results for input(s): AST, ALT, ALKPHOS, BILITOT, PROT, ALBUMIN in the last 168 hours. No results for input(s): LIPASE, AMYLASE in the last 168 hours. No results for input(s): AMMONIA in the last 168 hours. Diabetic: No results for input(s): HGBA1C in the last 72 hours. No results for input(s): GLUCAP in the last 168 hours. Cardiac Enzymes: No results for input(s): CKTOTAL, CKMB, CKMBINDEX, TROPONINI in the last 168 hours. No results for input(s): PROBNP in the last 8760 hours. Coagulation Profile: No results for input(s): INR, PROTIME in the last 168 hours. Thyroid Function Tests: No results for input(s): TSH, T4TOTAL, FREET4,  T3FREE, THYROIDAB in the last 72 hours. Lipid Profile: No results for input(s): CHOL, HDL, LDLCALC, TRIG, CHOLHDL, LDLDIRECT in the last 72 hours. Anemia Panel: No results for input(s): VITAMINB12, FOLATE, FERRITIN, TIBC, IRON, RETICCTPCT in the last 72 hours. Urine analysis:    Component Value Date/Time   COLORURINE YELLOW (A) 02/06/2020 2148   APPEARANCEUR CLEAR (A) 02/06/2020 2148   APPEARANCEUR Cloudy (A) 11/28/2016 1128   LABSPEC 1.009 02/06/2020 2148   LABSPEC 1.011 06/12/2014 2003   PHURINE 7.0 02/06/2020 2148   GLUCOSEU NEGATIVE 02/06/2020 2148   GLUCOSEU Negative 06/12/2014 2003   HGBUR NEGATIVE 02/06/2020 2148   BILIRUBINUR NEGATIVE 02/06/2020 2148   BILIRUBINUR Negative 10/30/2019 1516   BILIRUBINUR Negative 11/28/2016 1128   BILIRUBINUR Negative 06/12/2014 2003   KETONESUR NEGATIVE 02/06/2020 2148   PROTEINUR NEGATIVE 02/06/2020 2148   UROBILINOGEN 0.2 10/30/2019 1516   UROBILINOGEN 0.2 07/30/2013 1305   NITRITE NEGATIVE 02/06/2020 2148   LEUKOCYTESUR NEGATIVE 02/06/2020 2148   LEUKOCYTESUR Negative 06/12/2014 2003   Sepsis Labs: Invalid input(s): PROCALCITONIN,  [place]  Microbiology: Recent Results (from the past 240 hour(s))  Resp Panel by RT-PCR (Flu A&B, Covid) Nasopharyngeal Swab     Status: None   Collection Time: 08/06/21  3:51 PM   Specimen: Nasopharyngeal Swab; Nasopharyngeal(NP) swabs in vial transport medium  Result Value Ref Range Status   SARS Coronavirus 2 by RT PCR NEGATIVE NEGATIVE Final    Comment: (NOTE) SARS-CoV-2 target nucleic acids are NOT DETECTED.  The SARS-CoV-2 RNA is generally detectable in upper respiratory specimens during the acute phase of infection. The lowest concentration of SARS-CoV-2 viral copies this assay can detect is 138 copies/mL. A negative result does not preclude SARS-Cov-2 infection and should not be used as the sole basis for treatment or other patient management decisions. A negative result may occur with  improper specimen collection/handling, submission of specimen other than nasopharyngeal swab, presence of viral mutation(s) within the areas targeted by this assay, and inadequate number of viral copies(<138 copies/mL). A negative result must be combined with clinical observations, patient history, and epidemiological information. The expected result is Negative.  Fact Sheet for Patients:  EntrepreneurPulse.com.au  Fact Sheet for Healthcare Providers:  IncredibleEmployment.be  This test is no t yet approved or cleared by the [place] FDA and  has been authorized for detection and/or diagnosis of SARS-CoV-2 by FDA under an Emergency Use Authorization (EUA). This EUA will remain  in effect (meaning this test can be used) for the duration of the COVID-19 declaration under Section 564(b)(1) of the Act, 21 U.S.C.section 360bbb-3(b)(1), unless the authorization is terminated  or revoked sooner.       Influenza A by PCR NEGATIVE NEGATIVE Final   Influenza B by PCR NEGATIVE NEGATIVE Final    Comment: (NOTE) The Xpert Xpress SARS-CoV-2/FLU/RSV plus  assay is intended as an aid in the diagnosis of influenza from Nasopharyngeal swab specimens and should not be used as a sole basis for treatment. Nasal washings and aspirates are unacceptable for Xpert Xpress SARS-CoV-2/FLU/RSV testing.  Fact Sheet for Patients: EntrepreneurPulse.com.au  Fact Sheet for Healthcare Providers: IncredibleEmployment.be  This test is not yet approved or cleared by the [place] FDA and has been authorized for detection and/or diagnosis of SARS-CoV-2 by FDA under an Emergency Use Authorization (EUA). This EUA will remain in effect (meaning this test can be used) for the duration of the COVID-19 declaration under Section 564(b)(1) of the Act, 21 U.S.C. section 360bbb-3(b)(1), unless the authorization is terminated or  revoked.  Performed at [facility], [address] [place], [place] [address]   Group A Strep by PCR     Status: None   Collection Time: 08/06/21  8:02 PM   Specimen: Throat; Sterile Swab  Result Value Ref Range Status   Group A Strep by PCR NOT DETECTED NOT DETECTED Final    Comment: Performed at [facility], [address] [place], [place] [address]    Radiology Studies: DG Chest 2 View  Result Date: 08/06/2021 CLINICAL DATA:  Dyspnea.  Productive cough.  COPD.  CHF. EXAM: CHEST - 2 VIEW COMPARISON:  Chest x-ray 06/23/2021, CT chest 06/23/2021. FINDINGS: The heart and mediastinal contours are unchanged. Aortic calcification. Prominent pulmonary arteries. Hyperinflation of the lungs. No focal consolidation. Coarsened interstitial markings with no overt pulmonary edema. No pleural effusion. No pneumothorax. No acute osseous abnormality. IMPRESSION: 1. Chronic bronchitic lung changes with acute superimposed infection/inflammation not excluded. 2. Aortic Atherosclerosis (ICD10-I70.0) and Emphysema (ICD10-J43.9). 3. Persistent prominent pulmonary artery suggestive of pulmonary  hypertension. Electronically Signed   By: Iven Finn M.D.   On: 08/06/2021 16:24       Luwanna Brossman T. [place]  If 7PM-7AM, please contact night-coverage [email or web address] 08/07/2021, 3:56 PM

## 2021-08-08 ENCOUNTER — Inpatient Hospital Stay (HOSPITAL_COMMUNITY)
Admit: 2021-08-08 | Discharge: 2021-08-08 | Disposition: A | Discharge status: Home / Self Care | Payer: Medicare HMO | Attending: Student | Admitting: Student

## 2021-08-08 DIAGNOSIS — R1013 Epigastric pain: Secondary | ICD-10-CM

## 2021-08-08 DIAGNOSIS — R079 Chest pain, unspecified: Secondary | ICD-10-CM | POA: Diagnosis not present

## 2021-08-08 DIAGNOSIS — F419 Anxiety disorder, unspecified: Secondary | ICD-10-CM | POA: Diagnosis not present

## 2021-08-08 DIAGNOSIS — J9601 Acute respiratory failure with hypoxia: Secondary | ICD-10-CM | POA: Diagnosis not present

## 2021-08-08 DIAGNOSIS — J441 Chronic obstructive pulmonary disease with (acute) exacerbation: Secondary | ICD-10-CM | POA: Diagnosis not present

## 2021-08-08 DIAGNOSIS — J449 Chronic obstructive pulmonary disease, unspecified: Secondary | ICD-10-CM | POA: Diagnosis not present

## 2021-08-08 DIAGNOSIS — R0789 Other chest pain: Secondary | ICD-10-CM

## 2021-08-08 LAB — CBC
HCT: 45.9 % (ref 36.0–46.0)
Hemoglobin: 14.8 g/dL (ref 12.0–15.0)
MCH: 31.8 pg (ref 26.0–34.0)
MCHC: 32.2 g/dL (ref 30.0–36.0)
MCV: 98.5 fL (ref 80.0–100.0)
Platelets: 161 10*3/uL (ref 150–400)
RBC: 4.66 MIL/uL (ref 3.87–5.11)
RDW: 16.4 % — ABNORMAL HIGH (ref 11.5–15.5)
WBC: 13 10*3/uL — ABNORMAL HIGH (ref 4.0–10.5)
nRBC: 0 % (ref 0.0–0.2)

## 2021-08-08 LAB — RENAL FUNCTION PANEL
Albumin: 3.2 g/dL — ABNORMAL LOW (ref 3.5–5.0)
Anion gap: 9 (ref 5–15)
BUN: 18 mg/dL (ref 8–23)
CO2: 34 mmol/L — ABNORMAL HIGH (ref 22–32)
Calcium: 8.7 mg/dL — ABNORMAL LOW (ref 8.9–10.3)
Chloride: 98 mmol/L (ref 98–111)
Creatinine, Ser: 0.6 mg/dL (ref 0.44–1.00)
GFR, Estimated: >60 mL/min (ref >60)
Glucose, Bld: 160 mg/dL — ABNORMAL HIGH (ref 70–99)
Phosphorus: 3.3 mg/dL (ref 2.5–4.6)
Potassium: 3.9 mmol/L (ref 3.5–5.1)
Sodium: 141 mmol/L (ref 135–145)

## 2021-08-08 LAB — MAGNESIUM: Magnesium: 2.3 mg/dL (ref 1.7–2.4)

## 2021-08-08 MED ORDER — PANTOPRAZOLE SODIUM 40 MG PO TBEC
40.0000 mg | DELAYED_RELEASE_TABLET | Freq: Every day | ORAL | Status: DC
  Administered 2021-08-08 – 2021-08-12 (×5): 40 mg via ORAL
  Filled 2021-08-08 (×5): qty 1

## 2021-08-08 MED ORDER — SENNOSIDES-DOCUSATE SODIUM 8.6-50 MG PO TABS: 1.0000 | ORAL_TABLET | Freq: Two times a day (BID) | ORAL | Status: DC | PRN

## 2021-08-08 MED ORDER — POLYETHYLENE GLYCOL 3350 17 G PO PACK: 17.0000 g | PACK | Freq: Two times a day (BID) | ORAL | Status: DC | PRN

## 2021-08-08 NOTE — Evaluation (Signed)
Physical Therapy Evaluation Patient Details Name: Theresa [name] MRN: [id number] DOB: 02-25-1956 Today's Date: 08/08/2021  History of Present Illness  Pt is a 65 y/o F with PMH: COPD (on chronic 2.5Lnc), h/o tobacco use, GERD, and CHF. She presented to ED d/t worsening SOB and w/u revealed elevated WBC. Pt adm with concerns for PNA With leukocytosis.  Clinical Impression  Patient seated in recliner upon arrival to room; alert and oriented, motivated and eager to participate with mobility as able.  Endorses some improvement in respiratory status, but still has "coughing fits" that exacerbate SOB.  Bilat UE/LE strength and ROM grossly symmetrical and WFL; no focal weakness appreciated.  Able to complete sit/stand, basic transfers and gait (200') with RW, sup/mod indep.  Demonstrates reciprocal stepping pattern with good step height/length; steady cadence without buckling or LOB.  Did agree to/prefer use of RW for overall energy conservation with mobility tasks.  O2 increased to 4L with gait efforts; desat to 87% on 4L with distance, but recovers >90% within 30-45 seconds of seated rest and pursed lip breathing.  Returned to baseline 2.5L end of session, sats 90-91%. Would benefit from skilled PT to address above deficits and promote optimal return to PLOF.; will maintain on caseload throughout remaining hospital stay.  Do not anticipate formal PT needs at discharge; may benefit from referral to outpatient cardio/pulmonary rehab program.       Recommendations for follow up therapy are one component of a multi-disciplinary discharge planning process, led by the attending physician.  Recommendations may be updated based on patient status, additional functional criteria and insurance authorization.  Follow Up Recommendations No PT follow up    Equipment Recommendations       Recommendations for Other Services       Precautions / Restrictions Precautions Precautions: None Restrictions Weight  Bearing Restrictions: No      Mobility  Bed Mobility               General bed mobility comments: seated in recliner beginning/end of treatment session    Transfers Overall transfer level: Modified independent Equipment used: Rolling walker (2 wheeled) Transfers: Sit to/from Stand Sit to Stand: Supervision;Modified independent (Device/Increase time)         General transfer comment: good LE strength and control  Ambulation/Gait Ambulation/Gait assistance: Supervision;Modified independent (Device/Increase time) Gait Distance (Feet): 200 Feet Assistive device: Rolling walker (2 wheeled)       General Gait Details: reciprocal stepping pattern with good step height/length; steady cadence without buckling or LOB.  Did agree to/prefer use of RW for overall energy conservation with mobility tasks.  O2 increased to 4L with gait efforts; desat to 87% on 4L with distance, but recovers >90% within 30-45 seconds of seated rest and pursed lip breathing.  Stairs            Wheelchair Mobility    Modified Rankin (Stroke Patients Only)       Balance Overall balance assessment: Needs assistance Sitting-balance support: No upper extremity supported;Feet supported Sitting balance-Leahy Scale: Good     Standing balance support: No upper extremity supported;Bilateral upper extremity supported Standing balance-Leahy Scale: Good Standing balance comment: easily transitions to/from seating surfaces, changes directions and negotiates obstacles without difficulty                             Pertinent Vitals/Pain      Home Living Family/patient expects to be discharged to:: Private residence Living Arrangements:  Spouse/significant other;Children Available Help at Discharge: Family;Available 24 hours/day Type of Home: Mobile home Home Access: Stairs to enter Entrance Stairs-Rails: Left Entrance Stairs-Number of Steps: 3 Home Layout: One level Home Equipment:  Walker - 2 wheels;Walker - 4 wheels;Shower seat      Prior Function Level of Independence: Independent with assistive device(s)         Comments: able to perform fxl mobility w/o AD, but does only tolerate short distances. She uses shower seat to bathe. on 2.5Lnc chronically.  Denies fall history; intermittent caregiver for 66 year old granddaughter     Hand Dominance        Extremity/Trunk Assessment   Upper Extremity Assessment Upper Extremity Assessment: Overall WFL for tasks assessed    Lower Extremity Assessment Lower Extremity Assessment: Overall WFL for tasks assessed       Communication   Communication: No difficulties  Cognition Arousal/Alertness: Awake/alert Behavior During Therapy: WFL for tasks assessed/performed Overall Cognitive Status: Within Functional Limits for tasks assessed                                        General Comments      Exercises Other Exercises Other Exercises: Reviewed use of RW for energy conservation; discussed value of/strategies for energy conservation and activity pacing; reviewed pursed lip breathing and recognition of/response to signs/symptoms of fatigue.  Patient voiced understanding of all information provided.   Assessment/Plan    PT Assessment Patient needs continued PT services  PT Problem List Decreased activity tolerance;Decreased mobility;Cardiopulmonary status limiting activity       PT Treatment Interventions DME instruction;Gait training;Stair training;Functional mobility training;Therapeutic activities;Therapeutic exercise;Patient/family education    PT Goals (Current goals can be found in the Care Plan section)  Acute Rehab PT Goals Patient Stated Goal: to get breathing under better control and go home PT Goal Formulation: With patient Time For Goal Achievement: 08/22/21 Potential to Achieve Goals: Good    Frequency Min 2X/week   Barriers to discharge        Co-evaluation                AM-PAC PT "6 Clicks" Mobility  Outcome Measure Help needed turning from your back to your side while in a flat bed without using bedrails?: None Help needed moving from lying on your back to sitting on the side of a flat bed without using bedrails?: None Help needed moving to and from a bed to a chair (including a wheelchair)?: None Help needed standing up from a chair using your arms (e.g., wheelchair or bedside chair)?: None Help needed to walk in hospital room?: None Help needed climbing 3-5 steps with a railing? : A Little 6 Click Score: 23    End of Session Equipment Utilized During Treatment: Oxygen Activity Tolerance: Patient tolerated treatment well Patient left: in chair;with call bell/phone within reach;with family/visitor present Nurse Communication: Mobility status PT Visit Diagnosis: Muscle weakness (generalized) (M62.81);Difficulty in walking, not elsewhere classified (R26.2)    Time: 9093-1121 PT Time Calculation (min) (ACUTE ONLY): 27 min   Charges:   PT Evaluation $PT Eval Moderate Complexity: 1 Mod PT Treatments $Therapeutic Activity: 8-22 mins       Oluwanifemi Susman H. Owens Shark, PT, DPT, NCS 08/08/21, 11:09 AM [phone]

## 2021-08-08 NOTE — Progress Notes (Signed)
PROGRESS NOTE  Theresa [name] ZOX:[id number] DOB: 12/12/1955   PCP: Pcp, No  Patient is from: Home  DOA: 08/06/2021 LOS: 2  Chief complaints:  Chief Complaint  Patient presents with   Shortness of Breath     Brief Narrative / Interim history: 's 65 year old F with PMH of COPD, tobacco use disorder, PAH, diastolic CHF, OSA on CPAP, HTN, hypothyroidism, anxiety and depression presenting with shortness of breath and cough and admitted for acute respiratory failure with hypoxia due to COPD exacerbation.  WBC 16.4.  CXR with bronchitic changes and PAH.  BNP, serial troponin and Pro-Cal negative. She was a started on IV antibiotics, systemic steroid and nebulizers.    Subjective: Seen and examined earlier this morning.  No major events overnight of this morning.  Reports improvement in her breathing but not quite back to baseline.  Still with wheeze, cough and DOE.  Also reports some vague chest and epigastric discomfort but not able to describe.  She denies dizziness, nausea or vomiting. Objective: Vitals:   08/08/21 0604 08/08/21 0732 08/08/21 0908 08/08/21 1100  BP: (!) 108/55 122/66 110/71 107/64  Pulse: 74 81 79 99  Resp: 16 18    Temp: (!) 97.4 F (36.3 C) 97.6 F (36.4 C)  98.6 F (37 C)  TempSrc: Oral Oral  Oral  SpO2: 97% 91%  93%  Weight:      Height:        Intake/Output Summary (Last 24 hours) at 08/08/2021 1144 Last data filed at 08/08/2021 1023 Gross per 24 hour  Intake 480 ml  Output 700 ml  Net -220 ml   Filed Weights   08/06/21 1539  Weight: 79.4 kg    Examination:  GENERAL: No apparent distress.  Nontoxic. HEENT: MMM.  Vision and hearing grossly intact.  NECK: Supple.  No apparent JVD.  RESP: 93% on 2 L at rest.  No IWOB.  Rhonchi and end expiratory wheeze.  CVS:  RRR. Heart sounds normal.  ABD/GI/GU: BS+. Abd soft, NTND.  MSK/EXT:  Moves extremities. No apparent deformity. No edema.  SKIN: no apparent skin lesion or wound NEURO: Awake and alert.  Oriented appropriately.  No apparent focal neuro deficit. PSYCH: Calm. Normal affect.   Procedures:  None  Microbiology summarized: [id number] and influenza PCR nonreactive. Blood cultures pending.  Assessment & Plan: Acute on chronic respiratory failure with hypoxia due to COPD exacerbation-SOB and cough for 4 to 5 days.  Desaturated to 84% on home 2.5 L requiring 4 to 5 L for exertional tasks.  Still with DOE, rhonchi and expiratory wheeze on exam.  CXR with bronchitic changes and signs of PAH.  BNP, serial troponin and Pro-Cal negative.  -Continue CTX and azithromycin -Continue Solu-Medrol to 80 mg twice daily -Continue Pulmicort, scheduled DuoNeb with as needed DuoNeb.   -Wean oxygen as able.  Minimum to keep saturation above 88% -Encouraged to quit smoking cigarettes -IS, OOB/PT/OT -BiPAP at night -Ambulate daily for saturation assessment  Chest/epigastric discomfort: Not able to describe.  No tenderness to palpation. BNP, serial troponin and EKG without acute finding.  Clinically low suspicion for PE. -Manage COPD exacerbation as above. -Add p.o. Protonix for possible GERD/gastritis -Check TTE  Pulmonary hypertension: R/LHC in 2020 with no significant obstructive CAD but moderate PAH with wedge pressure of 13 mmHg.  Likely cor pulmonale from underlying COPD.  Could be playing a role in patient's hypoxemia. -Follow TTE -Continue home torsemide  Hypothyroidism -Continue home Synthroid  Anxiety/depression/chronic pain syndrome: Stable. -Continue home  meds  OSA on CPAP -Continue nightly BiPAP.   Tobacco use disorder -Cessation counseling given. -Continue nicotine patch   Class I obesity Body mass index is 32.01 kg/m.         DVT prophylaxis:  enoxaparin (LOVENOX) injection 40 mg Start: 08/06/21 2200 Place TED hose Start: 08/06/21 1759  Code Status: Full code Family Communication: Patient and/or RN. Available if any question.  Level of care: Med-Surg Status is:  Inpatient  Remains inpatient appropriate because: Ongoing respiratory distress and acute respiratory failure with hypoxia requiring higher level of oxygen than baseline.  IV medications and pending diagnostic evaluation   Consultants:  None   Sch Meds:  Scheduled Meds:  aspirin EC  81 mg Oral Daily   budesonide (PULMICORT) nebulizer solution  0.5 mg Nebulization BID   digoxin  0.25 mg Oral QAC breakfast   docusate sodium  100 mg Oral BID   enoxaparin (LOVENOX) injection  40 mg Subcutaneous QHS   fesoterodine  8 mg Oral Daily   ipratropium-albuterol  3 mL Nebulization QID   levothyroxine  25 mcg Oral Q0600   methylPREDNISolone (SOLU-MEDROL) injection  80 mg Intravenous BID   pantoprazole  40 mg Oral Daily   roflumilast  500 mcg Oral Daily   sertraline  200 mg Oral q morning   simvastatin  20 mg Oral QHS   torsemide  40 mg Oral BID   Continuous Infusions:  azithromycin 500 mg (08/08/21 1057)   cefTRIAXone (ROCEPHIN)  IV 2 g (08/08/21 0925)   PRN Meds:.acetaminophen **OR** acetaminophen, HYDROcodone-acetaminophen, ipratropium-albuterol, nicotine, ondansetron **OR** ondansetron (ZOFRAN) IV, polyethylene glycol, senna-docusate  Antimicrobials: Anti-infectives (From admission, onward)    Start     Dose/Rate Route Frequency Ordered Stop   08/07/21 1000  cefTRIAXone (ROCEPHIN) 2 g in sodium chloride 0.9 % 100 mL IVPB        2 g 200 mL/hr over 30 Minutes Intravenous Every 24 hours 08/06/21 1804 08/11/21 0959   08/07/21 1000  azithromycin (ZITHROMAX) 500 mg in sodium chloride 0.9 % 250 mL IVPB        500 mg 250 mL/hr over 60 Minutes Intravenous Every 24 hours 08/06/21 1804 08/09/21 0959   08/06/21 1730  azithromycin (ZITHROMAX) 500 mg in sodium chloride 0.9 % 250 mL IVPB        500 mg 250 mL/hr over 60 Minutes Intravenous  Once 08/06/21 1729 08/06/21 2015   08/06/21 1730  cefTRIAXone (ROCEPHIN) 1 g in sodium chloride 0.9 % 100 mL IVPB        1 g 200 mL/hr over 30 Minutes  Intravenous  Once 08/06/21 1729 08/06/21 1827        I have personally reviewed the following labs and images: CBC: Recent Labs  Lab 08/06/21 1548 08/07/21 0646 08/08/21 0344  WBC 16.4* 9.6 13.0*  NEUTROABS 14.3*  --   --   HGB 16.2* 15.6* 14.8  HCT 47.1* 47.3* 45.9  MCV 96.9 100.0 98.5  PLT 168 165 161   BMP &GFR Recent Labs  Lab 08/06/21 1548 08/07/21 0646 08/08/21 0344  NA 138 141 141  K 3.6 3.8 3.9  CL 97* 99 98  CO2 28 32 34*  GLUCOSE 127* 131* 160*  BUN _0 CREATININE 0.72 0.61 0.60  CALCIUM 8.9 9.0 8.7*  MG  --   --  2.3  PHOS  --   --  3.3   Estimated Creatinine Clearance: 69.3 mL/min (by C-G formula based on SCr of 0.6 mg/dL).  Liver & Pancreas: Recent Labs  Lab 08/08/21 0344  ALBUMIN 3.2*   No results for input(s): LIPASE, AMYLASE in the last 168 hours. No results for input(s): AMMONIA in the last 168 hours. Diabetic: No results for input(s): HGBA1C in the last 72 hours. No results for input(s): GLUCAP in the last 168 hours. Cardiac Enzymes: No results for input(s): CKTOTAL, CKMB, CKMBINDEX, TROPONINI in the last 168 hours. No results for input(s): PROBNP in the last 8760 hours. Coagulation Profile: No results for input(s): INR, PROTIME in the last 168 hours. Thyroid Function Tests: No results for input(s): TSH, T4TOTAL, FREET4, T3FREE, THYROIDAB in the last 72 hours. Lipid Profile: No results for input(s): CHOL, HDL, LDLCALC, TRIG, CHOLHDL, LDLDIRECT in the last 72 hours. Anemia Panel: No results for input(s): VITAMINB12, FOLATE, FERRITIN, TIBC, IRON, RETICCTPCT in the last 72 hours. Urine analysis:    Component Value Date/Time   COLORURINE YELLOW (A) 02/06/2020 2148   APPEARANCEUR CLEAR (A) 02/06/2020 2148   APPEARANCEUR Cloudy (A) 11/28/2016 1128   LABSPEC 1.009 02/06/2020 2148   LABSPEC 1.011 06/12/2014 2003   PHURINE 7.0 02/06/2020 2148   GLUCOSEU NEGATIVE 02/06/2020 2148   GLUCOSEU Negative 06/12/2014 2003   HGBUR NEGATIVE  02/06/2020 2148   BILIRUBINUR NEGATIVE 02/06/2020 2148   BILIRUBINUR Negative 10/30/2019 1516   BILIRUBINUR Negative 11/28/2016 1128   BILIRUBINUR Negative 06/12/2014 2003   KETONESUR NEGATIVE 02/06/2020 2148   PROTEINUR NEGATIVE 02/06/2020 2148   UROBILINOGEN 0.2 10/30/2019 1516   UROBILINOGEN 0.2 07/30/2013 1305   NITRITE NEGATIVE 02/06/2020 2148   LEUKOCYTESUR NEGATIVE 02/06/2020 2148   LEUKOCYTESUR Negative 06/12/2014 2003   Sepsis Labs: Invalid input(s): PROCALCITONIN, [place]  Microbiology: Recent Results (from the past 240 hour(s))  Resp Panel by RT-PCR (Flu A&B, Covid) Nasopharyngeal Swab     Status: None   Collection Time: 08/06/21  3:51 PM   Specimen: Nasopharyngeal Swab; Nasopharyngeal(NP) swabs in vial transport medium  Result Value Ref Range Status   SARS Coronavirus 2 by RT PCR NEGATIVE NEGATIVE Final    Comment: (NOTE) SARS-CoV-2 target nucleic acids are NOT DETECTED.  The SARS-CoV-2 RNA is generally detectable in upper respiratory specimens during the acute phase of infection. The lowest concentration of SARS-CoV-2 viral copies this assay can detect is 138 copies/mL. A negative result does not preclude SARS-Cov-2 infection and should not be used as the sole basis for treatment or other patient management decisions. A negative result may occur with  improper specimen collection/handling, submission of specimen other than nasopharyngeal swab, presence of viral mutation(s) within the areas targeted by this assay, and inadequate number of viral copies(<138 copies/mL). A negative result must be combined with clinical observations, patient history, and epidemiological information. The expected result is Negative.  Fact Sheet for Patients:  EntrepreneurPulse.com.au  Fact Sheet for Healthcare Providers:  IncredibleEmployment.be  This test is no t yet approved or cleared by the [place] FDA and  has been authorized for  detection and/or diagnosis of SARS-CoV-2 by FDA under an Emergency Use Authorization (EUA). This EUA will remain  in effect (meaning this test can be used) for the duration of the COVID-19 declaration under Section 564(b)(1) of the Act, 21 U.S.C.section 360bbb-3(b)(1), unless the authorization is terminated  or revoked sooner.       Influenza A by PCR NEGATIVE NEGATIVE Final   Influenza B by PCR NEGATIVE NEGATIVE Final    Comment: (NOTE) The Xpert Xpress SARS-CoV-2/FLU/RSV plus assay is intended as an aid in the diagnosis of influenza from Nasopharyngeal  swab specimens and should not be used as a sole basis for treatment. Nasal washings and aspirates are unacceptable for Xpert Xpress SARS-CoV-2/FLU/RSV testing.  Fact Sheet for Patients: EntrepreneurPulse.com.au  Fact Sheet for Healthcare Providers: IncredibleEmployment.be  This test is not yet approved or cleared by the [place] FDA and has been authorized for detection and/or diagnosis of SARS-CoV-2 by FDA under an Emergency Use Authorization (EUA). This EUA will remain in effect (meaning this test can be used) for the duration of the COVID-19 declaration under Section 564(b)(1) of the Act, 21 U.S.C. section 360bbb-3(b)(1), unless the authorization is terminated or revoked.  Performed at [facility], [address] [place], [place] [address]   Blood culture (routine x 2)     Status: None (Preliminary result)   Collection Time: 08/06/21  5:48 PM   Specimen: BLOOD  Result Value Ref Range Status   Specimen Description BLOOD BLOOD RIGHT FOREARM  Final   Special Requests   Final    BOTTLES DRAWN AEROBIC AND ANAEROBIC Blood Culture results may not be optimal due to an inadequate volume of blood received in culture bottles   Culture   Final    NO GROWTH 2 DAYS Performed at [facility], [address], [place], [place] [address]    Report Status PENDING  Incomplete   Group A Strep by PCR     Status: None   Collection Time: 08/06/21  8:02 PM   Specimen: Throat; Sterile Swab  Result Value Ref Range Status   Group A Strep by PCR NOT DETECTED NOT DETECTED Final    Comment: Performed at [facility], [address] [place], [place] [address]  Blood culture (routine x 2)     Status: None (Preliminary result)   Collection Time: 08/06/21  8:22 PM   Specimen: BLOOD  Result Value Ref Range Status   Specimen Description BLOOD LAAC  Final   Special Requests   Final    BOTTLES DRAWN AEROBIC AND ANAEROBIC Blood Culture results may not be optimal due to an inadequate volume of blood received in culture bottles   Culture   Final    NO GROWTH 2 DAYS Performed at [facility], [address] [place], [place] [address]    Report Status PENDING  Incomplete    Radiology Studies: No results found.     Jonea Bukowski T. [place]  If 7PM-7AM, please contact night-coverage [email or web address] 08/08/2021, 11:44 AM

## 2021-08-08 NOTE — Progress Notes (Signed)
*  PRELIMINARY RESULTS* Echocardiogram 2D Echocardiogram has been performed.  Theresa [name] 08/08/2021, 3:24 PM

## 2021-08-09 LAB — ECHOCARDIOGRAM COMPLETE
AR max vel: 2.46 cm2
AV Area VTI: 3.13 cm2
AV Area mean vel: 2.94 cm2
AV Mean grad: 4 mmHg
AV Peak grad: 10.1 mmHg
Ao pk vel: 1.59 m/s
Area-P 1/2: 4.08 cm2
Height: 62 in
MV VTI: 3.16 cm2
S' Lateral: 2.7 cm
Weight: 2800 oz

## 2021-08-09 MED ORDER — FLUTICASONE-UMECLIDIN-VILANT 100-62.5-25 MCG/INH IN AEPB: 1.0000 | INHALATION_SPRAY | Freq: Every day | RESPIRATORY_TRACT | Status: DC

## 2021-08-09 MED ORDER — DEXTROMETHORPHAN POLISTIREX ER 30 MG/5ML PO SUER
30.0000 mg | Freq: Two times a day (BID) | ORAL | Status: DC | PRN
  Administered 2021-08-09: 30 mg via ORAL
  Filled 2021-08-09 (×3): qty 5

## 2021-08-09 MED ORDER — UMECLIDINIUM BROMIDE 62.5 MCG/ACT IN AEPB
1.0000 | INHALATION_SPRAY | Freq: Every day | RESPIRATORY_TRACT | Status: DC
  Administered 2021-08-10 – 2021-08-12 (×3): 1 via RESPIRATORY_TRACT
  Filled 2021-08-09: qty 7

## 2021-08-09 MED ORDER — IPRATROPIUM-ALBUTEROL 0.5-2.5 (3) MG/3ML IN SOLN
3.0000 mL | Freq: Four times a day (QID) | RESPIRATORY_TRACT | Status: DC
  Filled 2021-08-09: qty 3

## 2021-08-09 MED ORDER — PREDNISONE 20 MG PO TABS
40.0000 mg | ORAL_TABLET | Freq: Every day | ORAL | Status: DC
  Administered 2021-08-09 – 2021-08-12 (×4): 40 mg via ORAL
  Filled 2021-08-09 (×3): qty 2

## 2021-08-09 MED ORDER — ALBUTEROL SULFATE (2.5 MG/3ML) 0.083% IN NEBU
2.5000 mg | INHALATION_SOLUTION | Freq: Four times a day (QID) | RESPIRATORY_TRACT | Status: DC
  Administered 2021-08-09 – 2021-08-11 (×6): 2.5 mg via RESPIRATORY_TRACT
  Filled 2021-08-09 (×6): qty 3

## 2021-08-09 MED ORDER — ALBUTEROL SULFATE (2.5 MG/3ML) 0.083% IN NEBU
2.5000 mg | INHALATION_SOLUTION | RESPIRATORY_TRACT | Status: DC | PRN
  Filled 2021-08-09: qty 3

## 2021-08-09 MED ORDER — FLUTICASONE FUROATE-VILANTEROL 100-25 MCG/ACT IN AEPB
1.0000 | INHALATION_SPRAY | Freq: Every day | RESPIRATORY_TRACT | Status: DC
  Administered 2021-08-10 – 2021-08-12 (×3): 1 via RESPIRATORY_TRACT
  Filled 2021-08-09: qty 28

## 2021-08-09 NOTE — Progress Notes (Signed)
Occupational Therapy Treatment Patient Details Name: Theresa [name] MRN: [id number] DOB: 08/08/1956 Today's Date: 08/09/2021   History of present illness Pt is a 65 y/o F with PMH: COPD (on chronic 2.5Lnc), h/o tobacco use, GERD, and CHF. She presented to ED d/t worsening SOB and w/u revealed elevated WBC. Pt adm with concerns for PNA With leukocytosis.   OT comments  Pt seen for OT treatment on this date. Upon arrival to room, pt awake and seated upright in bed with husband present. Pt agreeable to OT tx. Pt performed bed mobility and functional transfers MOD-I this date. Pt required SUPERVISION for standing grooming tasks and SUPERVISION for functional mobility of household distances (27f) without AD d/t decreased activity tolerance. Note: O2 titrated to 4L prior to mobility, with SpO2 90% after standing grooming tasks and 85% after functional mobility (SpO2 able to increase to 93% within 30 sec of activity cessation). Pt returned to 2.5L of supplemental O2 at end of session, and pt left in recliner with SpO2 >92%. Pt educated in energy conservation strategies including pursed lip breathing, activity pacing, home/routines modifications, and falls prevention; pt verbalized understanding of all education provided. Pt is making good progress toward goals and continues to benefit from skilled OT services to maximize return to PLOF and minimize risk of future falls, injury, caregiver burden, and readmission. Will continue to follow POC. Discharge recommendation remains appropriate.       Recommendations for follow up therapy are one component of a multi-disciplinary discharge planning process, led by the attending physician.  Recommendations may be updated based on patient status, additional functional criteria and insurance authorization.    Follow Up Recommendations  No OT follow up    Assistance Recommended at Discharge Intermittent Supervision/Assistance  Equipment Recommendations  BSC        Precautions / Restrictions Precautions Precautions: None Restrictions Weight Bearing Restrictions: No       Mobility Bed Mobility Overal bed mobility: Modified Independent                  Transfers Overall transfer level: Modified independent   Transfers: Sit to/from Stand Sit to Stand: Modified independent (Device/Increase time)                 Balance Overall balance assessment: Needs assistance Sitting-balance support: No upper extremity supported;Feet supported Sitting balance-Leahy Scale: Good Sitting balance - Comments: Good static sitting balance at EOB   Standing balance support: No upper extremity supported;During functional activity Standing balance-Leahy Scale: Good Standing balance comment: Good standing balance during standing grooming tasks                           ADL either performed or assessed with clinical judgement   ADL Overall ADL's : Needs assistance/impaired     Grooming: Wash/dry hands;Wash/dry face;Oral care;Brushing hair;Supervision/safety;Standing                               Functional mobility during ADLs: Supervision/safety (to walk 2026fwithout AD)        Cognition Arousal/Alertness: Awake/alert Behavior During Therapy: WFL for tasks assessed/performed Overall Cognitive Status: Within Functional Limits for tasks assessed  Exercises Other Exercises Other Exercises: Pt educated in energy conservation strategies including pursed lip breathing, activity pacing, home/routines modifications, and falls prevention. Pt verbalized understanding      General Comments O2 titrated to 4L prior to mobility; SpO2 90% after standing grooming tasks and 85% after functional mobility of household distances (223f) without AD. SpO2 able to increase to 93% within 30 sec of activity cessation. Pt returned to 2.5L of supplemental O2 at end of session, and  pt left in recliner with SpO2 >92%    Pertinent Vitals/ Pain       Pain Assessment: No/denies pain         Frequency  Min 1X/week        Progress Toward Goals  OT Goals(current goals can now be found in the care plan section)  Progress towards OT goals: Progressing toward goals  Acute Rehab OT Goals OT Goal Formulation: With patient Time For Goal Achievement: 08/21/21 Potential to Achieve Goals: Good  Plan Discharge plan remains appropriate;Frequency remains appropriate       AM-PAC OT "6 Clicks" Daily Activity     Outcome Measure   Help from another person eating meals?: None Help from another person taking care of personal grooming?: None Help from another person toileting, which includes using toliet, bedpan, or urinal?: A Little Help from another person bathing (including washing, rinsing, drying)?: A Little Help from another person to put on and taking off regular upper body clothing?: None Help from another person to put on and taking off regular lower body clothing?: A Little 6 Click Score: 21    End of Session Equipment Utilized During Treatment: Oxygen;Gait belt  OT Visit Diagnosis: Muscle weakness (generalized) (M62.81)   Activity Tolerance Patient tolerated treatment well   Patient Left in chair;with call bell/phone within reach;with family/visitor present   Nurse Communication Mobility status        Time: [address]25OT Time Calculation (min): 28 min  Charges: OT General Charges $OT Visit: 1 Visit OT Treatments $Self Care/Home Management : 8-22 mins $Therapeutic Activity: 8-22 mins  LFredirick Maudlin OTR/L AWilhoit

## 2021-08-09 NOTE — Care Management Important Message (Signed)
Important Message  Patient Details  Name: Theresa [name] MRN: [id number] Date of Birth: January 13, 1956   Medicare Important Message Given:  N/A - LOS <3 / Initial given by admissions     Theresa [name] 08/09/2021, 3:16 PM

## 2021-08-09 NOTE — TOC Initial Note (Signed)
Transition of Care [facility]) - Initial/Assessment Note    Patient Details  Name: Theresa [name] MRN: [id number] Date of Birth: January 12, 1956  [facility] CM/SW Contact:    Shelbie Hutching, RN Phone Number: 08/09/2021, 10:49 AM  Clinical Narrative:                 Patient admitted to the hospital with COPD exacerbation.  Patient is on chronic oxygen at home [place] 2L with [employer].  Patient also has a home [employer] that she has here with her at the hospital.  [facility] met with patient at the bedside.  Patient is from home with her husband.  She is independent and drives.  Patient is current with [facility] but may want to switch providers soon, as Dr. Joana Reamer retired recently.  Patient has had home health in the past with [facility], and [facility].  Patient would like to use [facility] for home health services RN.  [name] with [facility] accepted referral for RN.  No PT follow up recommended.    Expected Discharge Plan: [place] Barriers to Discharge: Continued Medical Work up   Patient Goals and CMS Choice Patient states their goals for this hospitalization and ongoing recovery are:: Patient wants to return home and agrees to home health nursing CMS Medicare.gov Compare Post Acute Care list provided to:: Patient Choice offered to / list presented to : Patient  Expected Discharge Plan and Services Expected Discharge Plan: [place]   Discharge Planning Services: CM Consult Post Acute Care Choice: [place] arrangements for the past 2 months: Mobile Home                 DME Arranged: N/A DME Agency: NA       HH Arranged: RN [place] Agency: [place] Date [place]: 08/09/21 Time [place]: 1 Representative spoke with at [place]: [name]  Prior Living Arrangements/Services Living arrangements for the past 2 months: [place] with:: Spouse Patient language and need for  interpreter reviewed:: Yes Do you feel safe going back to the place where you live?: Yes      Need for Family Participation in Patient Care: Yes (Comment) (COPD) Care giver support system in place?: Yes (comment) (husband) Current home services: DME (oxygen, nebulizer, trillogy) Criminal Activity/Legal Involvement Pertinent to Current Situation/Hospitalization: No - Comment as needed  Activities of Daily Living Home Assistive Devices/Equipment: None ADL Screening (condition at time of admission) Patient's cognitive ability adequate to safely complete daily activities?: Yes Is the patient deaf or have difficulty hearing?: No Does the patient have difficulty seeing, even when wearing glasses/contacts?: No Does the patient have difficulty concentrating, remembering, or making decisions?: No Patient able to express need for assistance with ADLs?: Yes Does the patient have difficulty dressing or bathing?: No Independently performs ADLs?: Yes (appropriate for developmental age) Does the patient have difficulty walking or climbing stairs?: No Weakness of Legs: None Weakness of Arms/Hands: None  Permission Sought/Granted Permission sought to share information with : Case Manager, Family Supports, Other (comment) Permission granted to share information with : Yes, Verbal Permission Granted  Share Information with NAME: [place] granted to share info w AGENCY: [facility]  Permission granted to share info w Relationship: husband  Permission granted to share info w Contact Information: [phone]  Emotional Assessment Appearance:: Appears stated age Attitude/Demeanor/Rapport: Engaged Affect (typically observed): Accepting Orientation: : Oriented to Self,  Oriented to Place, Oriented to  Time, Oriented to Situation Alcohol / Substance Use: Not Applicable Psych Involvement: No (comment)  Admission diagnosis:  Hypoxia [R09.02] COPD exacerbation ([place]) [J44.1] Acute  respiratory failure with hypoxia ([place]) [J96.01] Community acquired pneumonia, unspecified laterality [J18.9] Patient Active Problem List   Diagnosis Date Noted   Acute respiratory failure with hypoxia ([place]) 08/07/2021   COPD exacerbation ([place]) 08/06/2021   Leukocytosis 08/06/2021   Encounter for tobacco use cessation counseling 08/06/2021   Chronic shoulder pain (Bilateral) 05/05/2021   Chronic use of opiate for therapeutic purpose 01/26/2021   Left thigh pain 77/41/4239   Uncomplicated opioid dependence ([place]) 04/08/2020   Pharmacologic therapy 04/08/2020   Pneumonia due to COVID-19 virus 02/03/2020   Numbness and tingling in right hand 12/31/2019   Numbness in feet 12/31/2019   Altered consciousness 11/26/2019   Frequent falls 11/26/2019   Weakness of both lower extremities 11/26/2019   Lumbar disc extrusion (L2-3) (Left) 09/10/2019   Chronic knee pain (Left) 08/14/2019   Chronic left-sided lumbar radiculopathy 08/14/2019   Abnormal MRI, lumbar spine (07/23/2019) 08/14/2019   Acute on chronic respiratory failure with hypoxia (HCC) 07/06/2019   Cervicalgia (Bilateral) (R>L) 09/24/2018   Spondylosis without myelopathy or radiculopathy, lumbosacral region 09/24/2018   Epigastric hernia 05/04/2018   Chronic lower extremity pain (Bilateral) (L>R) 01/25/2018   Altered mental status, unspecified 12/09/2017   Chronic hip pain (3ry area of Pain) (Bilateral) (L>R) 11/27/2017   COPD (chronic obstructive pulmonary disease) ([place]) 08/11/2017   Other long term (current) drug therapy 08/08/2017   Disorder of bone, unspecified 08/08/2017   COPD with hypoxia ([place]) 06/27/2017   Arthralgia of acromioclavicular joint (Right) 06/15/2017   Acromioclavicular joint DJD (Right) 06/15/2017   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   Chronic shoulder pain (Left) 05/11/2017   Elevated brain natriuretic peptide (BNP) level 05/11/2017   Grief at loss of child 02/14/2017   Lumbar facet hypertrophy  (multilevel) 11/17/2016   Lumbar spinal stenosis (L4-5) 11/17/2016   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) 11/17/2016   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) 11/17/2016   Lumbosacral radiculopathy at L4 11/17/2016   Supplemental oxygen dependent 08/01/2016   Sleep apnea 06/02/2016   Long term current use of opiate analgesic 01/12/2016   Long term prescription opiate use 01/12/2016   Opiate use (15 MME/Day) 01/12/2016   Lumbar facet syndrome (Bilateral) (L>R) 01/12/2016   Chronic sacroiliac joint pain (Bilateral) 01/12/2016   Vitamin D deficiency 12/30/2015   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) 12/28/2015   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/2015   Coagulation disorder ([place]) 10/22/2015   Chronic pain syndrome (significant psychosocial component) 09/21/2015   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 09/21/2015   Encounter for therapeutic drug level monitoring 09/16/2015   Encounter for chronic pain management 09/16/2015   Pain management 09/16/2015   Neurogenic pain 09/16/2015   Neuropathic pain 09/16/2015   Musculoskeletal pain 09/16/2015   Lumbar spondylosis (L4-5) 09/16/2015   Chronic low back pain (1ry area of Pain) (Bilateral) (L>R) w/o sciatica 09/16/2015   Chronic lower extremity pain (2ry area of Pain) (Left) 09/16/2015   Chronic lumbar radicular pain (L4 & S1 Dermatome) (Left) 09/16/2015   Chronic shoulder pain (Right side) 09/16/2015   Chronic upper extremity pain (Right-sided) 09/16/2015   Cervical radiculitis (Right side) 09/16/2015   Abnormal x-ray of lumbar spine 09/16/2015   Chronic diastolic heart failure ([place]) 07/15/2015   Chest pain 07/15/2015   Tobacco abuse 07/15/2015   Tendinitis  04/21/2015   Blood clotting disorder ([place]) 04/21/2015   Decreased motor strength 07/16/2014   Clinical depression 07/16/2014   Chronic rhinitis 07/16/2014   Carpal tunnel syndrome 07/16/2014   Anxiety state  07/16/2014   Nausea without vomiting 07/16/2014   Edema 07/16/2014   Generalized muscle weakness 07/16/2014   Neuritis or radiculitis due to rupture of lumbar intervertebral disc 07/02/2014   DDD (degenerative disc disease), lumbar 07/02/2014   CAFL (chronic airflow limitation) ([place]) 02/27/2014   Herpes zoster 07/16/2009   Essential (primary) hypertension 03/27/2008   PCP:  Pcp, No Pharmacy:   [facility], [address] [place] [address] Phone: [phone] Fax: [facility] #[id number] [facility], [place] [address] Phone: [phone] Fax: [phone]     Social Determinants of Health (SDOH) Interventions    Readmission Risk Interventions Readmission Risk Prevention Plan 08/09/2021 02/05/2020 07/13/2019  Transportation Screening Complete Complete Complete  PCP or Specialist Appt within 5-7 Days - - Complete  PCP or Specialist Appt within 3-5 Days Complete Complete -  Home Care Screening - - Complete  Medication Review (RN CM) - - Complete  HRI or Home Care Consult Complete Complete -  Social Work Consult for [place] Planning/Counseling Complete - -  Palliative Care Screening Not Applicable - -  Medication Review (RN Care Manager) Complete Complete -  Some recent data might be hidden

## 2021-08-09 NOTE — Progress Notes (Addendum)
PROGRESS NOTE  Theresa [name] WFU:[id number] DOB: [birth year]   PCP: Pcp, No  Patient is from: Home  DOA: 08/06/2021 LOS: 3  Chief complaints:  Chief Complaint  Patient presents with   Shortness of Breath     Brief Narrative / Interim history: 's 65 year old F with PMH of COPD, tobacco use disorder, PAH, diastolic CHF, OSA on CPAP, HTN, hypothyroidism, anxiety and depression presenting with shortness of breath and cough and admitted for acute respiratory failure with hypoxia due to COPD exacerbation.  WBC 16.4.  CXR with bronchitic changes and PAH.  BNP, serial troponin and Pro-Cal negative. She was a started on IV antibiotics, systemic steroid and nebulizers.    Subjective: Seen and examined earlier this morning.  She reports dyspnea and cough improving from admission but not back to baseline. Remains winded with speaking.  Objective: Vitals:   08/09/21 0953 08/09/21 1035 08/09/21 1215 08/09/21 1334  BP: 126/76  112/73   Pulse: 76  95   Resp: 16  20   Temp: 97.6 F (36.4 C)  97.8 F (36.6 C)   TempSrc: Oral  Oral   SpO2: 94% 94% 96% 96%  Weight:      Height:        Intake/Output Summary (Last 24 hours) at 08/09/2021 1540 Last data filed at 08/09/2021 1411 Gross per 24 hour  Intake 480 ml  Output 1450 ml  Net -970 ml   Filed Weights   08/06/21 1539  Weight: 79.4 kg    Examination:  GENERAL: No apparent distress.  Nontoxic. HEENT: MMM.  Vision and hearing grossly intact.  NECK: Supple.  No apparent JVD.  RESP: 93% on 2 L at rest.  No IWOB.  Rhonchi and end expiratory wheeze.  CVS:  RRR. Heart sounds normal.  ABD/GI/GU: BS+. Abd soft, NTND.  MSK/EXT:  Moves extremities. No apparent deformity. No edema.  SKIN: no apparent skin lesion or wound NEURO: Awake and alert. Oriented appropriately.  No apparent focal neuro deficit. PSYCH: Calm. Normal affect.   Procedures:  None  Microbiology summarized: [id number] and influenza PCR nonreactive. Blood cultures  pending.  Assessment & Plan: Acute on chronic respiratory failure with hypoxia due to COPD exacerbation-SOB and cough for 4 to 5 days.  Desaturated to 84% on home 2.5 L requiring 4 to 5 L for exertional tasks.  Is on home 2.5 L at rest. Still with DOE, rhonchi and expiratory wheeze on exam.  CXR with bronchitic changes and signs of PAH.  BNP, serial troponin and Pro-Cal negative. Improving - s/p 3 days azithromycin 500. S/p 4 days ceftriaxone, will plan on 1 more day of that - will decrease steroids. Stop solumedrol 80 bid, start prednisone 40 qd - cont home trelegy. Albuterol standing q6 and prn -Wean oxygen as able.  Minimum to keep saturation above 88% -Encouraged to quit smoking cigarettes -IS, OOB/PT/OT -BiPAP at night -Ambulate daily for saturation assessment - sputum for culture - outpt pulm [facility] Del) f/u  Chest/epigastric discomfort: Not able to describe.  No tenderness to palpation. BNP, serial troponin and EKG without acute finding.  Clinically low suspicion for PE. TTE unremarkable. May be gerd -Manage COPD exacerbation as above.  GERD Pt reports hx of gerd and today says chest symptoms are similar to prior gerd. Was previously on omeprazole and requests to re-start ppi - plan to continue pantop at d/c  Pulmonary hypertension: R/LHC in 2020 with no significant obstructive CAD but moderate PAH with wedge pressure of 13 mmHg.  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 -Continue home meds  OSA on CPAP -Continue nightly BiPAP.   Tobacco use disorder -Cessation counseling given. -Continue nicotine patch   Class I obesity Body mass index is 32.01 kg/m.         DVT prophylaxis:  enoxaparin (LOVENOX) injection 40 mg Start: 08/06/21 2200 Place TED hose Start: 08/06/21 1759  Code Status: Full code Family Communication: Patient and/or RN.  Available if any question.  Level of care: Med-Surg Status is: Inpatient  Remains inpatient appropriate because: Ongoing respiratory distress and acute respiratory failure with hypoxia requiring higher level of oxygen than baseline.  IV medications and pending diagnostic evaluation   Consultants:  None   Sch Meds:  Scheduled Meds:  aspirin EC  81 mg Oral Daily   budesonide (PULMICORT) nebulizer solution  0.5 mg Nebulization BID   digoxin  0.25 mg Oral QAC breakfast   docusate sodium  100 mg Oral BID   enoxaparin (LOVENOX) injection  40 mg Subcutaneous QHS   fesoterodine  8 mg Oral Daily   ipratropium-albuterol  3 mL Nebulization Q6H   levothyroxine  25 mcg Oral Q0600   pantoprazole  40 mg Oral Daily   roflumilast  500 mcg Oral Daily   sertraline  200 mg Oral q morning   simvastatin  20 mg Oral QHS   torsemide  40 mg Oral BID   Continuous Infusions:  cefTRIAXone (ROCEPHIN)  IV 2 g (08/09/21 0906)   PRN Meds:.acetaminophen **OR** acetaminophen, dextromethorphan, HYDROcodone-acetaminophen, ipratropium-albuterol, nicotine, ondansetron **OR** ondansetron (ZOFRAN) IV, polyethylene glycol, senna-docusate  Antimicrobials: Anti-infectives (From admission, onward)    Start     Dose/Rate Route Frequency Ordered Stop   08/07/21 1000  cefTRIAXone (ROCEPHIN) 2 g in sodium chloride 0.9 % 100 mL IVPB        2 g 200 mL/hr over 30 Minutes Intravenous Every 24 hours 08/06/21 1804 08/11/21 0959   08/07/21 1000  azithromycin (ZITHROMAX) 500 mg in sodium chloride 0.9 % 250 mL IVPB        500 mg 250 mL/hr over 60 Minutes Intravenous Every 24 hours 08/06/21 1804 08/08/21 1257   08/06/21 1730  azithromycin (ZITHROMAX) 500 mg in sodium chloride 0.9 % 250 mL IVPB        500 mg 250 mL/hr over 60 Minutes Intravenous  Once 08/06/21 1729 08/06/21 2015   08/06/21 1730  cefTRIAXone (ROCEPHIN) 1 g in sodium chloride 0.9 % 100 mL IVPB        1 g 200 mL/hr over 30 Minutes Intravenous  Once 08/06/21 1729  08/06/21 1827        I have personally reviewed the following labs and images: CBC: Recent Labs  Lab 08/06/21 1548 08/07/21 0646 08/08/21 0344  WBC 16.4* 9.6 13.0*  NEUTROABS 14.3*  --   --   HGB 16.2* 15.6* 14.8  HCT 47.1* 47.3* 45.9  MCV 96.9 100.0 98.5  PLT 168 165 161   BMP &GFR Recent Labs  Lab 08/06/21 1548 08/07/21 0646 08/08/21 0344  NA 138 141 141  K 3.6 3.8 3.9  CL 97* 99 98  CO2 28 32 34*  GLUCOSE 127* 131* 160*  BUN _0 CREATININE 0.72 0.61 0.60  CALCIUM 8.9 9.0 8.7*  MG  --   --  2.3  PHOS  --   --  3.3   Estimated Creatinine Clearance: 69.3 mL/min (by C-G formula based  on SCr of 0.6 mg/dL). Liver & Pancreas: Recent Labs  Lab 08/08/21 0344  ALBUMIN 3.2*   No results for input(s): LIPASE, AMYLASE in the last 168 hours. No results for input(s): AMMONIA in the last 168 hours. Diabetic: No results for input(s): HGBA1C in the last 72 hours. No results for input(s): GLUCAP in the last 168 hours. Cardiac Enzymes: No results for input(s): CKTOTAL, CKMB, CKMBINDEX, TROPONINI in the last 168 hours. No results for input(s): PROBNP in the last 8760 hours. Coagulation Profile: No results for input(s): INR, PROTIME in the last 168 hours. Thyroid Function Tests: No results for input(s): TSH, T4TOTAL, FREET4, T3FREE, THYROIDAB in the last 72 hours. Lipid Profile: No results for input(s): CHOL, HDL, LDLCALC, TRIG, CHOLHDL, LDLDIRECT in the last 72 hours. Anemia Panel: No results for input(s): VITAMINB12, FOLATE, FERRITIN, TIBC, IRON, RETICCTPCT in the last 72 hours. Urine analysis:    Component Value Date/Time   COLORURINE YELLOW (A) 02/06/2020 2148   APPEARANCEUR CLEAR (A) 02/06/2020 2148   APPEARANCEUR Cloudy (A) 11/28/2016 1128   LABSPEC 1.009 02/06/2020 2148   LABSPEC 1.011 06/12/2014 2003   PHURINE 7.0 02/06/2020 2148   GLUCOSEU NEGATIVE 02/06/2020 2148   GLUCOSEU Negative 06/12/2014 2003   HGBUR NEGATIVE 02/06/2020 2148   BILIRUBINUR  NEGATIVE 02/06/2020 2148   BILIRUBINUR Negative 10/30/2019 1516   BILIRUBINUR Negative 11/28/2016 1128   BILIRUBINUR Negative 06/12/2014 2003   KETONESUR NEGATIVE 02/06/2020 2148   PROTEINUR NEGATIVE 02/06/2020 2148   UROBILINOGEN 0.2 10/30/2019 1516   UROBILINOGEN 0.2 07/30/2013 1305   NITRITE NEGATIVE 02/06/2020 2148   LEUKOCYTESUR NEGATIVE 02/06/2020 2148   LEUKOCYTESUR Negative 06/12/2014 2003   Sepsis Labs: Invalid input(s): PROCALCITONIN, [place]  Microbiology: Recent Results (from the past 240 hour(s))  Resp Panel by RT-PCR (Flu A&B, Covid) Nasopharyngeal Swab     Status: None   Collection Time: 08/06/21  3:51 PM   Specimen: Nasopharyngeal Swab; Nasopharyngeal(NP) swabs in vial transport medium  Result Value Ref Range Status   SARS Coronavirus 2 by RT PCR NEGATIVE NEGATIVE Final    Comment: (NOTE) SARS-CoV-2 target nucleic acids are NOT DETECTED.  The SARS-CoV-2 RNA is generally detectable in upper respiratory specimens during the acute phase of infection. The lowest concentration of SARS-CoV-2 viral copies this assay can detect is 138 copies/mL. A negative result does not preclude SARS-Cov-2 infection and should not be used as the sole basis for treatment or other patient management decisions. A negative result may occur with  improper specimen collection/handling, submission of specimen other than nasopharyngeal swab, presence of viral mutation(s) within the areas targeted by this assay, and inadequate number of viral copies(<138 copies/mL). A negative result must be combined with clinical observations, patient history, and epidemiological information. The expected result is Negative.  Fact Sheet for Patients:  EntrepreneurPulse.com.au  Fact Sheet for Healthcare Providers:  IncredibleEmployment.be  This test is no t yet approved or cleared by the [place] FDA and  has been authorized for detection and/or diagnosis of  SARS-CoV-2 by FDA under an Emergency Use Authorization (EUA). This EUA will remain  in effect (meaning this test can be used) for the duration of the COVID-19 declaration under Section 564(b)(1) of the Act, 21 U.S.C.section 360bbb-3(b)(1), unless the authorization is terminated  or revoked sooner.       Influenza A by PCR NEGATIVE NEGATIVE Final   Influenza B by PCR NEGATIVE NEGATIVE Final    Comment: (NOTE) The Xpert Xpress SARS-CoV-2/FLU/RSV plus assay is intended as an aid in the  diagnosis of influenza from Nasopharyngeal swab specimens and should not be used as a sole basis for treatment. Nasal washings and aspirates are unacceptable for Xpert Xpress SARS-CoV-2/FLU/RSV testing.  Fact Sheet for Patients: EntrepreneurPulse.com.au  Fact Sheet for Healthcare Providers: IncredibleEmployment.be  This test is not yet approved or cleared by the [place] FDA and has been authorized for detection and/or diagnosis of SARS-CoV-2 by FDA under an Emergency Use Authorization (EUA). This EUA will remain in effect (meaning this test can be used) for the duration of the COVID-19 declaration under Section 564(b)(1) of the Act, 21 U.S.C. section 360bbb-3(b)(1), unless the authorization is terminated or revoked.  Performed at [facility], [address] [place], [place] [address]   Blood culture (routine x 2)     Status: None (Preliminary result)   Collection Time: 08/06/21  5:48 PM   Specimen: BLOOD  Result Value Ref Range Status   Specimen Description BLOOD BLOOD RIGHT FOREARM  Final   Special Requests   Final    BOTTLES DRAWN AEROBIC AND ANAEROBIC Blood Culture results may not be optimal due to an inadequate volume of blood received in culture bottles   Culture   Final    NO GROWTH 3 DAYS Performed at [facility], [address] [place],  [place] [address]    Report Status PENDING  Incomplete  Group A Strep by PCR      Status: None   Collection Time: 08/06/21  8:02 PM   Specimen: Throat; Sterile Swab  Result Value Ref Range Status   Group A Strep by PCR NOT DETECTED NOT DETECTED Final    Comment: Performed at [facility], [address] [place], [place] [address]  Blood culture (routine x 2)     Status: None (Preliminary result)   Collection Time: 08/06/21  8:22 PM   Specimen: BLOOD  Result Value Ref Range Status   Specimen Description BLOOD LAAC  Final   Special Requests   Final    BOTTLES DRAWN AEROBIC AND ANAEROBIC Blood Culture results may not be optimal due to an inadequate volume of blood received in culture bottles   Culture   Final    NO GROWTH 3 DAYS Performed at [facility], [address], [place], [place] [address]    Report Status PENDING  Incomplete    Radiology Studies: No results found.     Laurey Arrow, MD [facility]  If 7PM-7AM, please contact night-coverage [email or web address] 08/09/2021, 3:40 PM

## 2021-08-10 DIAGNOSIS — J441 Chronic obstructive pulmonary disease with (acute) exacerbation: Secondary | ICD-10-CM | POA: Diagnosis not present

## 2021-08-10 LAB — EXPECTORATED SPUTUM ASSESSMENT W GRAM STAIN, RFLX TO RESP C

## 2021-08-10 LAB — CBC
HCT: 50.3 % — ABNORMAL HIGH (ref 36.0–46.0)
Hemoglobin: 16.2 g/dL — ABNORMAL HIGH (ref 12.0–15.0)
MCH: 31.5 pg (ref 26.0–34.0)
MCHC: 32.2 g/dL (ref 30.0–36.0)
MCV: 97.7 fL (ref 80.0–100.0)
Platelets: 214 10*3/uL (ref 150–400)
RBC: 5.15 MIL/uL — ABNORMAL HIGH (ref 3.87–5.11)
RDW: 16 % — ABNORMAL HIGH (ref 11.5–15.5)
WBC: 11 10*3/uL — ABNORMAL HIGH (ref 4.0–10.5)
nRBC: 0 % (ref 0.0–0.2)

## 2021-08-10 LAB — BASIC METABOLIC PANEL
Anion gap: 10 (ref 5–15)
BUN: 23 mg/dL (ref 8–23)
CO2: 36 mmol/L — ABNORMAL HIGH (ref 22–32)
Calcium: 9.2 mg/dL (ref 8.9–10.3)
Chloride: 95 mmol/L — ABNORMAL LOW (ref 98–111)
Creatinine, Ser: 0.67 mg/dL (ref 0.44–1.00)
GFR, Estimated: >60 mL/min (ref >60)
Glucose, Bld: 138 mg/dL — ABNORMAL HIGH (ref 70–99)
Potassium: 4.6 mmol/L (ref 3.5–5.1)
Sodium: 141 mmol/L (ref 135–145)

## 2021-08-10 MED ORDER — GUAIFENESIN-DM 100-10 MG/5ML PO SYRP
10.0000 mL | ORAL_SOLUTION | Freq: Four times a day (QID) | ORAL | Status: DC
Start: 2021-08-10 — End: 2021-08-12
  Administered 2021-08-10 – 2021-08-12 (×7): 10 mL via ORAL
  Filled 2021-08-10 (×8): qty 10

## 2021-08-10 MED ORDER — SODIUM CHLORIDE 0.9 % IV SOLN
2.0000 g | INTRAVENOUS | Status: AC
  Administered 2021-08-11: 10:00:00 2 g via INTRAVENOUS
  Filled 2021-08-10: qty 2

## 2021-08-10 NOTE — Care Management Important Message (Signed)
Important Message  Patient Details  Name: Theresa [name] MRN: [id number] Date of Birth: July 13, 1956   Medicare Important Message Given:  Yes     Juliann Pulse A Sayyid [place] 08/10/2021, 2:53 PM

## 2021-08-10 NOTE — Progress Notes (Signed)
[facility] Progress Note  Patient: Theresa [name]    KVQ:[id number]  DOA: 08/06/2021     Date of Service: the patient was seen and examined on 08/10/2021  Chief Complaint  Patient presents with   Shortness of Breath   Brief hospital course:  65 year old F with PMH of COPD, tobacco use disorder, PAH, diastolic CHF, OSA on CPAP, HTN, hypothyroidism, anxiety and depression presenting with shortness of breath and cough and admitted for acute respiratory failure with hypoxia due to COPD exacerbation.  WBC 16.4.  CXR with bronchitic changes and PAH.  BNP, serial troponin and Pro-Cal negative. She was a started on IV antibiotics, systemic steroid and nebulizers.     Assessment and Plan: Acute on chronic respiratory failure with hypoxia due to COPD exacerbation- SOB and cough for 4 to 5 days.  Desaturated to 84% on home 2.5 L requiring 4 to 5 L for exertional tasks.  Is on home 2.5 L at rest. Still with DOE, rhonchi and expiratory wheeze on exam.  CXR with bronchitic changes and signs of PAH.  BNP, serial troponin and Pro-Cal negative. Improving - s/p 3 days azithromycin 500. S/p 4 days ceftriaxone, will complete 5 days - will decrease steroids. Stop solumedrol 80 bid, start prednisone 40 qd - cont home trelegy. Albuterol standing q6 and prn -Wean oxygen as able.  Minimum to keep saturation above 88% -Encouraged to quit smoking cigarettes -IS, OOB/PT/OT -BiPAP at night -Ambulate daily for saturation assessment - sputum for culture - outpt pulm Raul Del) f/u   Chest/epigastric discomfort: Not able to describe.  No tenderness to palpation. BNP, serial troponin and EKG without acute finding.  Clinically low suspicion for PE. TTE unremarkable. May be gerd -Manage COPD exacerbation as above.   GERD Pt reports hx of gerd and today says chest symptoms are similar to prior gerd. Was previously on omeprazole and requests to re-start ppi - plan to continue pantop at d/c   Pulmonary  hypertension: R/LHC in 2020 with no significant obstructive CAD but moderate PAH with wedge pressure of 13 mmHg.  Likely cor pulmonale -Continue home torsemide - outpt cardiology ([facility]) f/u   Hypothyroidism -Continue home Synthroid   Anxiety/depression/chronic pain syndrome: Stable. -Continue home meds   OSA on CPAP -Continue nightly BiPAP.   Tobacco use disorder -Cessation counseling given. -Continue nicotine patch     Body mass index is 32.01 kg/m.  Interventions:     Diet: Heart healthy DVT Prophylaxis: Subcutaneous Lovenox   Advance goals of care discussion: Full code  Family Communication: family was NOTY present at bedside, at the time of interview.  The pt provided permission to discuss medical plan with the family. Opportunity was given to ask question and all questions were answered satisfactorily.   Disposition:  Pt is from Home, admitted with SOB, resp failure, still has SOB, which precludes a safe discharge. Discharge to Home, when clinically improves, may require 1 to 2 days more.  Subjective: No significant overnight events, patient still having cough and shortness of breath, overall feels improvement.  Patient denies any chest pain palpitation, no any other active issues.   Physical Exam: General:  alert oriented to time, place, and person.  Appear in mild distress, affect appropriate Eyes: PERRLA ENT: Oral Mucosa Clear, moist  Neck: no JVD,  Cardiovascular: S1 and S2 Present, no Murmur,  Respiratory: good respiratory effort, Bilateral Air entry equal and Decreased, no Crackles, mild wheezes Abdomen: Bowel Sound present, Soft and no tenderness,  Skin: no rasjes Extremities:  no Pedal edema, no calf tenderness Neurologic: without any new focal findings Gait not checked due to patient safety concerns  Vitals:   08/10/21 0555 08/10/21 0731 08/10/21 1131 08/10/21 1305  BP: 114/74 122/73 113/62   Pulse: 66 62 70   Resp: _0 Temp: 97.7 F  (36.5 C) 98.8 F (37.1 C) 98 F (36.7 C)   TempSrc:      SpO2: 99% 99% 91% 91%  Weight:      Height:        Intake/Output Summary (Last 24 hours) at 08/10/2021 1535 Last data filed at 08/10/2021 1018 Gross per 24 hour  Intake 480 ml  Output --  Net 480 ml   Filed Weights   08/06/21 1539  Weight: 79.4 kg    Data Reviewed: I have personally reviewed and interpreted daily labs, tele strips, imagings as discussed above. I reviewed all nursing notes, pharmacy notes, vitals, pertinent old records I have discussed plan of care as described above with RN and patient/family.  CBC: Recent Labs  Lab 08/06/21 1548 08/07/21 0646 08/08/21 0344 08/10/21 0539  WBC 16.4* 9.6 13.0* 11.0*  NEUTROABS 14.3*  --   --   --   HGB 16.2* 15.6* 14.8 16.2*  HCT 47.1* 47.3* 45.9 50.3*  MCV 96.9 100.0 98.5 97.7  PLT 168 165 161 464   Basic Metabolic Panel: Recent Labs  Lab 08/06/21 1548 08/07/21 0646 08/08/21 0344 08/10/21 0539  NA 138 141 141 141  K 3.6 3.8 3.9 4.6  CL 97* 99 98 95*  CO2 28 32 34* 36*  GLUCOSE 127* 131* 160* 138*  BUN _1 CREATININE 0.72 0.61 0.60 0.67  CALCIUM 8.9 9.0 8.7* 9.2  MG  --   --  2.3  --   PHOS  --   --  3.3  --     Studies: No results found.  Scheduled Meds:  albuterol  2.5 mg Nebulization Q6H   aspirin EC  81 mg Oral Daily   digoxin  0.25 mg Oral QAC breakfast   docusate sodium  100 mg Oral BID   enoxaparin (LOVENOX) injection  40 mg Subcutaneous QHS   fesoterodine  8 mg Oral Daily   fluticasone furoate-vilanterol  1 puff Inhalation Daily   And   umeclidinium bromide  1 puff Inhalation Daily   guaiFENesin-dextromethorphan  10 mL Oral Q6H   levothyroxine  25 mcg Oral Q0600   pantoprazole  40 mg Oral Daily   predniSONE  40 mg Oral Q breakfast   roflumilast  500 mcg Oral Daily   sertraline  200 mg Oral q morning   simvastatin  20 mg Oral QHS   torsemide  40 mg Oral BID   Continuous Infusions: PRN Meds: albuterol,  HYDROcodone-acetaminophen, ondansetron **OR** ondansetron (ZOFRAN) IV, polyethylene glycol, senna-docusate  Time spent: 35 minutes  Author: Val Riles. MD [facility] 08/10/2021 3:35 PM  To reach On-call, see care teams to locate the attending and reach out to them via [email or web address]. If 7PM-7AM, please contact night-coverage If you still have difficulty reaching the attending provider, please page the [facility] (Director on Call) for [facility] on amion for assistance.

## 2021-08-10 NOTE — Progress Notes (Addendum)
Occupational Therapy Treatment Patient Details Name: Theresa [name] MRN: [id number] DOB: 05/31/1956 Today's Date: 08/10/2021   History of present illness Pt is a 65 y/o F with PMH: COPD (on chronic 2.5Lnc), h/o tobacco use, GERD, and CHF. She presented to ED d/t worsening SOB and w/u revealed elevated WBC. Pt adm with concerns for PNA With leukocytosis.   OT comments  Pt. sitting upright unsupported in bed preparing to eat soup upon arrival. Pt. education was provided about energy conservation, and work simplification techniques for ADLs, IADLs, and home management. Pt. was provided with a visual handout for energy conservation, work simplification, fatigue management, and DME. Pt. education was provided about pursed lip breathing. Pt.'s SO2 91%,  with HR 74 bpms on 2.5L O2 seated. Pt. continues to benefit from OT services for ADL training, A/E training, and pt. education about energy conservation, work simplification, home modification, and DME. Pt. Plans to return home upon discharge, with family assistance as needed. No follow-up OT services are warranted at this time.    Recommendations for follow up therapy are one component of a multi-disciplinary discharge planning process, led by the attending physician.  Recommendations may be updated based on patient status, additional functional criteria and insurance authorization.    Follow Up Recommendations  No OT follow up    Assistance Recommended at Discharge Intermittent Supervision/Assistance  Equipment Recommendations  [employer]    Recommendations for Other Services      Precautions / Restrictions Precautions Precautions: None Restrictions Weight Bearing Restrictions: No       Mobility Bed Mobility Overal bed mobility: Modified Independent                  Transfers Overall transfer level: Modified independent                       Balance                                           ADL either  performed or assessed with clinical judgement   ADL   Eating/Feeding: Independent;Set up                                     General ADL Comments: Pt. education was provided about energy conservation, and work simplification techniuqes for ADLs/IADLs/home management     Vision       Perception     Praxis      Cognition Arousal/Alertness: Awake/alert Behavior During Therapy: WFL for tasks assessed/performed Overall Cognitive Status: Within Functional Limits for tasks assessed                                            Exercises     Shoulder Instructions       General Comments      Pertinent Vitals/ Pain       Pain Assessment: No/denies pain  Home Living                                          Prior Functioning/Environment  Frequency  Min 1X/week        Progress Toward Goals  OT Goals(current goals can now be found in the care plan section)  Progress towards OT goals: Progressing toward goals  Acute Rehab OT Goals OT Goal Formulation: With patient Time For Goal Achievement: 08/21/21 Potential to Achieve Goals: Good  Plan Discharge plan remains appropriate;Frequency remains appropriate    Co-evaluation                 AM-PAC OT "6 Clicks" Daily Activity     Outcome Measure   Help from another person eating meals?: None Help from another person taking care of personal grooming?: None Help from another person toileting, which includes using toliet, bedpan, or urinal?: A Little Help from another person bathing (including washing, rinsing, drying)?: A Little Help from another person to put on and taking off regular upper body clothing?: None Help from another person to put on and taking off regular lower body clothing?: A Little 6 Click Score: 21    End of Session Equipment Utilized During Treatment: Oxygen;Gait belt  OT Visit Diagnosis: Muscle weakness (generalized)  (M62.81)   Activity Tolerance Patient tolerated treatment well   Patient Left in chair;with call bell/phone within reach;with family/visitor present   Nurse Communication         Time: 9767-3419 OT Time Calculation (min): 16 min  Charges: OT General Charges $OT Visit: 1 Visit OT Treatments $Self Care/Home Management : 8-22 mins  Harrel Carina, MS, OTR/L   Harrel Carina 08/10/2021, 4:46 PM

## 2021-08-11 DIAGNOSIS — J441 Chronic obstructive pulmonary disease with (acute) exacerbation: Secondary | ICD-10-CM | POA: Diagnosis not present

## 2021-08-11 LAB — PHOSPHORUS: Phosphorus: 2.8 mg/dL (ref 2.5–4.6)

## 2021-08-11 LAB — CULTURE, BLOOD (ROUTINE X 2)
Culture: NO GROWTH
Culture: NO GROWTH

## 2021-08-11 LAB — BASIC METABOLIC PANEL
Anion gap: 7 (ref 5–15)
BUN: 22 mg/dL (ref 8–23)
CO2: 35 mmol/L — ABNORMAL HIGH (ref 22–32)
Calcium: 8.4 mg/dL — ABNORMAL LOW (ref 8.9–10.3)
Chloride: 97 mmol/L — ABNORMAL LOW (ref 98–111)
Creatinine, Ser: 0.63 mg/dL (ref 0.44–1.00)
GFR, Estimated: >60 mL/min (ref >60)
Glucose, Bld: 115 mg/dL — ABNORMAL HIGH (ref 70–99)
Potassium: 3 mmol/L — ABNORMAL LOW (ref 3.5–5.1)
Sodium: 139 mmol/L (ref 135–145)

## 2021-08-11 LAB — CBC
HCT: 46.4 % — ABNORMAL HIGH (ref 36.0–46.0)
Hemoglobin: 15.3 g/dL — ABNORMAL HIGH (ref 12.0–15.0)
MCH: 31.9 pg (ref 26.0–34.0)
MCHC: 33 g/dL (ref 30.0–36.0)
MCV: 96.9 fL (ref 80.0–100.0)
Platelets: 202 10*3/uL (ref 150–400)
RBC: 4.79 MIL/uL (ref 3.87–5.11)
RDW: 15.9 % — ABNORMAL HIGH (ref 11.5–15.5)
WBC: 11.5 10*3/uL — ABNORMAL HIGH (ref 4.0–10.5)
nRBC: 0 % (ref 0.0–0.2)

## 2021-08-11 LAB — MAGNESIUM: Magnesium: 2.5 mg/dL — ABNORMAL HIGH (ref 1.7–2.4)

## 2021-08-11 MED ORDER — POTASSIUM CHLORIDE CRYS ER 20 MEQ PO TBCR
40.0000 meq | EXTENDED_RELEASE_TABLET | Freq: Four times a day (QID) | ORAL | Status: DC
  Administered 2021-08-11 (×2): 40 meq via ORAL
  Filled 2021-08-11 (×2): qty 2

## 2021-08-11 MED ORDER — ALBUTEROL SULFATE (2.5 MG/3ML) 0.083% IN NEBU
2.5000 mg | INHALATION_SOLUTION | Freq: Three times a day (TID) | RESPIRATORY_TRACT | Status: DC
  Administered 2021-08-11 – 2021-08-12 (×2): 2.5 mg via RESPIRATORY_TRACT
  Filled 2021-08-11 (×2): qty 3

## 2021-08-11 MED ORDER — POTASSIUM CHLORIDE CRYS ER 20 MEQ PO TBCR
20.0000 meq | EXTENDED_RELEASE_TABLET | Freq: Two times a day (BID) | ORAL | Status: DC
  Administered 2021-08-12: 08:00:00 20 meq via ORAL
  Filled 2021-08-11: qty 1

## 2021-08-11 MED ORDER — POTASSIUM CHLORIDE CRYS ER 20 MEQ PO TBCR: 40.0000 meq | EXTENDED_RELEASE_TABLET | Freq: Every day | ORAL | Status: DC

## 2021-08-11 MED ORDER — POTASSIUM CHLORIDE CRYS ER 20 MEQ PO TBCR
40.0000 meq | EXTENDED_RELEASE_TABLET | Freq: Once | ORAL | Status: AC
  Administered 2021-08-11: 20:00:00 40 meq via ORAL
  Filled 2021-08-11: qty 2

## 2021-08-11 NOTE — Progress Notes (Signed)
[facility] Progress Note  Patient: Theresa [name]    GGY:[id number]  DOA: 08/06/2021     Date of Service: the patient was seen and examined on 08/11/2021  Chief Complaint  Patient presents with   Shortness of Breath   Brief hospital course:  65 year old F with PMH of COPD, tobacco use disorder, PAH, diastolic CHF, OSA on CPAP, HTN, hypothyroidism, anxiety and depression presenting with shortness of breath and cough and admitted for acute respiratory failure with hypoxia due to COPD exacerbation.  WBC 16.4.  CXR with bronchitic changes and PAH.  BNP, serial troponin and Pro-Cal negative. She was a started on IV antibiotics, systemic steroid and nebulizers.     Assessment and Plan: Acute on chronic respiratory failure with hypoxia due to COPD exacerbation- SOB and cough for 4 to 5 days.  Desaturated to 84% on home 2.5 L requiring 4 to 5 L for exertional tasks.  Is on home 2.5 L at rest. Still with DOE, rhonchi and expiratory wheeze on exam.  CXR with bronchitic changes and signs of PAH.  BNP, serial troponin and Pro-Cal negative. Improving - s/p 3 days azithromycin 500. S/p 4 days ceftriaxone, will complete 5 days - will decrease steroids. Stop solumedrol 80 bid, start prednisone 40 qd - cont home trelegy. Albuterol standing q6 and prn -Wean oxygen as able.  Minimum to keep saturation above 88% -Encouraged to quit smoking cigarettes -IS, OOB/PT/OT -BiPAP at night -Ambulate daily for saturation assessment - sputum for culture - outpt pulm Raul [facility]) f/u    Chest/epigastric discomfort: Not able to describe.  No tenderness to palpation. BNP, serial troponin and EKG without acute finding.  Clinically low suspicion for PE. TTE unremarkable. May be gerd -Manage COPD exacerbation as above.   GERD Pt reports hx of gerd and today says chest symptoms are similar to prior gerd. Was previously on omeprazole and requests to re-start ppi - plan to continue pantop at d/c   Pulmonary  hypertension: R/LHC in 2020 with no significant obstructive CAD but moderate PAH with wedge pressure of 13 mmHg.  Likely cor pulmonale -Continue home torsemide - outpt cardiology ([facility]) f/u   Hypokalemia secondary to torsemide, patient is on torsemide due to pulmonary hypertension Patient takes potassium chloride 20 mg p.o. twice daily at home which has been restarted Potassium repleted We will continue to monitor potassium and replete as needed.  Hypothyroidism -Continue home Synthroid   Anxiety/depression/chronic pain syndrome: Stable. -Continue home meds   OSA on CPAP -Continue nightly BiPAP.   Tobacco use disorder -Cessation counseling given. -Continue nicotine patch     Body mass index is 32.01 kg/m.  Interventions:     Diet: Heart healthy DVT Prophylaxis: Subcutaneous Lovenox   Advance goals of care discussion: Full code  Family Communication: family was present at bedside, at the time of interview.  The pt provided permission to discuss medical plan with the family. Opportunity was given to ask question and all questions were answered satisfactorily.   Disposition:  Pt is from Home, admitted with SOB, resp failure, still has SOB, which precludes a safe discharge. Discharge to Home, when clinically improves, may require 1 to 2 days more.  Subjective: No significant overnight events, patient feels improvement in the breathing and cough.  Patient denies any chest pain palpitation, no any other active issues. Patient wanted to go home, potassium is low so we will replete and plan for disposition tomorrow a.m.   Physical Exam: General:  alert oriented to time, place, and  person.  Appear in mild distress, affect appropriate Eyes: PERRLA ENT: Oral Mucosa Clear, moist  Neck: no JVD,  Cardiovascular: S1 and S2 Present, no Murmur,  Respiratory: good respiratory effort, Bilateral Air entry equal and Decreased, no Crackles, mild wheezes Abdomen: Bowel Sound  present, Soft and no tenderness,  Skin: no rasjes Extremities: no Pedal edema, no calf tenderness Neurologic: without any new focal findings Gait not checked due to patient safety concerns  Vitals:   08/11/21 0523 08/11/21 0726 08/11/21 1144 08/11/21 1313  BP: 130/80 107/64 111/75   Pulse: 66 (!) 56 71   Resp: _0 Temp:  97.7 F (36.5 C) 98.2 F (36.8 C)   TempSrc:  Oral Oral   SpO2: 96% 96% 94% 92%  Weight:      Height:        Intake/Output Summary (Last 24 hours) at 08/11/2021 1453 Last data filed at 08/11/2021 1413 Gross per 24 hour  Intake 240 ml  Output 600 ml  Net -360 ml   Filed Weights   08/06/21 1539  Weight: 79.4 kg    Data Reviewed: I have personally reviewed and interpreted daily labs, tele strips, imagings as discussed above. I reviewed all nursing notes, pharmacy notes, vitals, pertinent old records I have discussed plan of care as described above with RN and patient/family.  CBC: Recent Labs  Lab 08/06/21 1548 08/07/21 0646 08/08/21 0344 08/10/21 0539 08/11/21 0410  WBC 16.4* 9.6 13.0* 11.0* 11.5*  NEUTROABS 14.3*  --   --   --   --   HGB 16.2* 15.6* 14.8 16.2* 15.3*  HCT 47.1* 47.3* 45.9 50.3* 46.4*  MCV 96.9 100.0 98.5 97.7 96.9  PLT 168 165 161 214 878   Basic Metabolic Panel: Recent Labs  Lab 08/06/21 1548 08/07/21 0646 08/08/21 0344 08/10/21 0539 08/11/21 0410  NA 138 141 141 141 139  K 3.6 3.8 3.9 4.6 3.0*  CL 97* 99 98 95* 97*  CO2 28 32 34* 36* 35*  GLUCOSE 127* 131* 160* 138* 115*  BUN _1 CREATININE 0.72 0.61 0.60 0.67 0.63  CALCIUM 8.9 9.0 8.7* 9.2 8.4*  MG  --   --  2.3  --  2.5*  PHOS  --   --  3.3  --  2.8    Studies: No results found.  Scheduled Meds:  albuterol  2.5 mg Nebulization Q6H   aspirin EC  81 mg Oral Daily   docusate sodium  100 mg Oral BID   enoxaparin (LOVENOX) injection  40 mg Subcutaneous QHS   fesoterodine  8 mg Oral Daily   fluticasone furoate-vilanterol  1 puff Inhalation  Daily   And   umeclidinium bromide  1 puff Inhalation Daily   guaiFENesin-dextromethorphan  10 mL Oral Q6H   levothyroxine  25 mcg Oral Q0600   pantoprazole  40 mg Oral Daily   [START ON 08/12/2021] potassium chloride  20 mEq Oral BID   potassium chloride  40 mEq Oral Once   predniSONE  40 mg Oral Q breakfast   roflumilast  500 mcg Oral Daily   sertraline  200 mg Oral q morning   simvastatin  20 mg Oral QHS   torsemide  40 mg Oral BID   Continuous Infusions: PRN Meds: albuterol, HYDROcodone-acetaminophen, ondansetron **OR** ondansetron (ZOFRAN) IV, polyethylene glycol, senna-docusate  Time spent: 35 minutes  Author: Val Riles. MD [facility] 08/11/2021 2:53 PM  To reach On-call, see care teams to locate  the attending and reach out to them via [email or web address]. If 7PM-7AM, please contact night-coverage If you still have difficulty reaching the attending provider, please page the [facility] (Director on Call) for [facility] on amion for assistance.

## 2021-08-11 NOTE — Progress Notes (Signed)
PT Cancellation Note  Patient Details Name: Theresa [name] MRN: [id number] DOB: July 18, 1956   Cancelled Treatment:    Reason Eval/Treat Not Completed: Patient declined, no reason specified. Pt chart reviewed. Upon arrival, pt reports someone told her she "didn't have to do that no more" regarding mobility/therapy. She declines participation at this time. Spoke to RN; pt had plans to d/c today however K+ is low. Pt has tentative plans to d/c tomorrow pending labs. PT will return at later date if pt remains inpatient.    Theresa [name] PT, DPT 08/11/21 1:37 PM [phone]

## 2021-08-11 NOTE — Progress Notes (Addendum)
Occupational Therapy Treatment Patient Details Name: Theresa [name] MRN: [id number] DOB: [birth year] Today's Date: 08/11/2021   History of present illness Pt is a 65 y/o F with PMH: COPD (on chronic 2.5Lnc), h/o tobacco use, GERD, and CHF. She presented to ED d/t worsening SOB and w/u revealed elevated WBC. Pt adm with concerns for PNA With leukocytosis.   OT comments  Pt seen for OT tx this to f/u re: safety with fxl mobility including energy conservation, RPE, and self-initiating rest. Pt demos improved fxl activity tolerance as evidenced by not requiring AD for fxl mobility 2 laps around nursing station with only one standing rest break (initiated by OT). OT completes ed with pt re: taking rest when RR is >38 with amb, spO2 <85%, or pt is perceiving activity as "vigorous" on RPE scale. Pt with good understanding and demos carryover. She demos progress with ADLs including MOD I in sitting for LB dressing with sit/lateral lean technique. Pt returned to room, left sitting in recliner with all needs met and in reach. Pt has met all OT goals at this time, all education completed. Will complete OT order at this time. Continue to anticipate that pt might benefit from cardiopulm OP f/u should the healthcare team find pt an appropriate candidate. Note: Pt is on 2.5Lnc at baseline, 3Lnc used on O2 tank during fxl mobility. Pt sustains sats >88% for 47f. She briefly drops to 87% on first lap, but recovers with 3 rounds PLB while still ambulating. Drops to 85% on second lap, but recovers within 20 seconds standing rest break while PLB. Pt returned to 2.5Lnc end of session with sats 90% at rest.   Recommendations for follow up therapy are one component of a multi-disciplinary discharge planning process, led by the attending physician.  Recommendations may be updated based on patient status, additional functional criteria and insurance authorization.    Follow Up Recommendations  No OT follow up    Assistance  Recommended at Discharge Intermittent Supervision/Assistance  Equipment Recommendations  [facility]   Recommendations for Other Services      Precautions / Restrictions Precautions Precautions: None Restrictions Weight Bearing Restrictions: No       Mobility Bed Mobility Overal bed mobility: Modified Independent             General bed mobility comments: seated in recliner beginning/end of treatment session    Transfers Overall transfer level: Modified independent                       Balance Overall balance assessment: Modified Independent   Sitting balance-Leahy Scale: Normal       Standing balance-Leahy Scale: Good                             ADL either performed or assessed with clinical judgement   ADL Overall ADL's : Modified independent;At baseline                                     Functional mobility during ADLs: Supervision/safety;Modified independent (no AD, 2 laps)       Vision       Perception     Praxis      Cognition Arousal/Alertness: Awake/alert Behavior During Therapy: WFL for tasks assessed/performed Overall Cognitive Status: Within Functional Limits for tasks assessed  Exercises Other Exercises Other Exercises: OT ed with pt re: RPE And self-initiating rest breaks as needed with fucntional mobility when spO2 85 or below or RR >38 with walking (or if pt percieves mobility is reaching "vigorous activity")   Shoulder Instructions       General Comments      Pertinent Vitals/ Pain       Pain Assessment: No/denies pain  Home Living                                          Prior Functioning/Environment              Frequency           Progress Toward Goals  OT Goals(current goals can now be found in the care plan section)  Progress towards OT goals: Goals met/education completed, patient discharged  from OT  Acute Rehab OT Goals OT Goal Formulation: All assessment and education complete, DC therapy  Plan All goals met and education completed, patient discharged from OT services    Co-evaluation                 AM-PAC OT "6 Clicks" Daily Activity     Outcome Measure   Help from another person eating meals?: None Help from another person taking care of personal grooming?: None Help from another person toileting, which includes using toliet, bedpan, or urinal?: None Help from another person bathing (including washing, rinsing, drying)?: A Little Help from another person to put on and taking off regular upper body clothing?: None Help from another person to put on and taking off regular lower body clothing?: None 6 Click Score: 23    End of Session Equipment Utilized During Treatment: Oxygen  OT Visit Diagnosis: Muscle weakness (generalized) (M62.81)   Activity Tolerance Patient tolerated treatment well   Patient Left in chair;with call bell/phone within reach;with family/visitor present   Nurse Communication Mobility status        Time: 0940-1008 OT Time Calculation (min): 28 min  Charges: OT General Charges $OT Visit: 1 Visit OT Treatments $Self Care/Home Management : 8-22 mins $Therapeutic Activity: 8-22 mins  Gerrianne Scale, [place], OTR/L ascom [phone] 08/11/21, 10:23 AM

## 2021-08-12 ENCOUNTER — Ambulatory Visit: Payer: Medicare HMO | Admitting: Pain Medicine

## 2021-08-12 DIAGNOSIS — J441 Chronic obstructive pulmonary disease with (acute) exacerbation: Secondary | ICD-10-CM | POA: Diagnosis not present

## 2021-08-12 LAB — CBC
HCT: 48 % — ABNORMAL HIGH (ref 36.0–46.0)
Hemoglobin: 15.6 g/dL — ABNORMAL HIGH (ref 12.0–15.0)
MCH: 31.3 pg (ref 26.0–34.0)
MCHC: 32.5 g/dL (ref 30.0–36.0)
MCV: 96.4 fL (ref 80.0–100.0)
Platelets: 195 10*3/uL (ref 150–400)
RBC: 4.98 MIL/uL (ref 3.87–5.11)
RDW: 15.9 % — ABNORMAL HIGH (ref 11.5–15.5)
WBC: 11.4 10*3/uL — ABNORMAL HIGH (ref 4.0–10.5)
nRBC: 0 % (ref 0.0–0.2)

## 2021-08-12 LAB — BASIC METABOLIC PANEL
Anion gap: 9 (ref 5–15)
BUN: 19 mg/dL (ref 8–23)
CO2: 34 mmol/L — ABNORMAL HIGH (ref 22–32)
Calcium: 8.9 mg/dL (ref 8.9–10.3)
Chloride: 98 mmol/L (ref 98–111)
Creatinine, Ser: 0.64 mg/dL (ref 0.44–1.00)
GFR, Estimated: >60 mL/min (ref >60)
Glucose, Bld: 109 mg/dL — ABNORMAL HIGH (ref 70–99)
Potassium: 3.6 mmol/L (ref 3.5–5.1)
Sodium: 141 mmol/L (ref 135–145)

## 2021-08-12 LAB — MAGNESIUM: Magnesium: 2.6 mg/dL — ABNORMAL HIGH (ref 1.7–2.4)

## 2021-08-12 LAB — PHOSPHORUS: Phosphorus: 3.7 mg/dL (ref 2.5–4.6)

## 2021-08-12 MED ORDER — TOLTERODINE TARTRATE ER 4 MG PO CP24: 4.0000 mg | ORAL_CAPSULE | Freq: Every day | ORAL | 2 refills | Status: AC

## 2021-08-12 MED ORDER — POTASSIUM CHLORIDE 20 MEQ PO PACK: 20.0000 meq | PACK | Freq: Once | ORAL | Status: DC

## 2021-08-12 MED ORDER — PREDNISONE 10 MG (21) PO TBPK: ORAL_TABLET | ORAL | 0 refills | Status: DC

## 2021-08-12 MED ORDER — PANTOPRAZOLE SODIUM 40 MG PO TBEC: 40.0000 mg | DELAYED_RELEASE_TABLET | Freq: Every day | ORAL | 0 refills | Status: DC

## 2021-08-12 MED ORDER — POTASSIUM CHLORIDE CRYS ER 20 MEQ PO TBCR
20.0000 meq | EXTENDED_RELEASE_TABLET | Freq: Once | ORAL | Status: AC
  Administered 2021-08-12: 20 meq via ORAL
  Filled 2021-08-12: qty 1

## 2021-08-12 MED ORDER — GUAIFENESIN-DM 100-10 MG/5ML PO SYRP: 10.0000 mL | ORAL_SOLUTION | Freq: Four times a day (QID) | ORAL | 0 refills | Status: DC

## 2021-08-12 NOTE — TOC Transition Note (Signed)
Transition of Care [facility]) - CM/SW Discharge Note   Patient Details  Name: Theresa [name] MRN: [id number] Date of Birth: 01/29/1956  [facility] CM/SW Contact:  Shelbie Hutching, RN Phone Number: 08/12/2021, 12:24 PM   Clinical Narrative:    Patient is medically cleared for discharge home today.  Home Health RN has been arranged with [facility].  [name] with [facility] aware of discharge today.  Patient's husband is here to transport her home.     Final next level of care: [place] Barriers to Discharge: Barriers Resolved   Patient Goals and CMS Choice Patient states their goals for this hospitalization and ongoing recovery are:: Patient wants to return home and agrees to home health nursing CMS Medicare.gov Compare Post Acute Care list provided to:: Patient Choice offered to / list presented to : Patient  Discharge Placement                       Discharge Plan and Services   Discharge Planning Services: CM Consult Post Acute Care Choice: Home Health          DME Arranged: N/A DME Agency: NA       HH Arranged: RN, Nurse's Aide [place] Agency: [place] Date [place]: 08/12/21 Time [place]: [id number] Representative spoke with at [employer]: [name]  Social Determinants of Health ([place]) Interventions     Readmission Risk Interventions Readmission Risk Prevention Plan 08/09/2021 02/05/2020 07/13/2019  Transportation Screening Complete Complete Complete  PCP or Specialist Appt within 5-7 Days - - Complete  PCP or Specialist Appt within 3-5 Days Complete Complete -  Home Care Screening - - Complete  Medication Review (RN CM) - - Complete  HRI or Home Care Consult Complete Complete -  Social Work Consult for [place] Planning/Counseling Complete - -  Palliative Care Screening Not Applicable - -  Medication Review (RN Care Manager) Complete Complete -  Some recent data might be hidden

## 2021-08-12 NOTE — Discharge Summary (Signed)
[facility] Discharge Summary   Patient: Theresa [name] [id number]:[id number]  PCP: Pcp, No  Date of admission: 08/06/2021   Date of discharge:  08/12/2021     Discharge Diagnoses:  Principal Problem:   COPD exacerbation ([place]) Active Problems:   Essential (primary) hypertension   DDD (degenerative disc disease), lumbar   Anxiety state   Tobacco abuse   Chronic pain syndrome (significant psychosocial component)   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HCC)   Sleep apnea   COPD with hypoxia ([place])   Acute on chronic respiratory failure with hypoxia ([place])   Uncomplicated opioid dependence ([place])   Leukocytosis   Encounter for tobacco use cessation counseling   Acute respiratory failure with hypoxia ([place])   Admitted From: Home Disposition:  Home   Recommendations for Outpatient Follow-up:  PCP: in 1 wk Follow up LABS/TEST:  CXR in  4 wks    Diet recommendation: Cardiac diet  Activity: The patient is advised to gradually reintroduce usual activities, as tolerated  Discharge Condition: stable  Code Status: Full code   History of present illness: As per the H and P dictated on admission Hospital Course:  65 year old F with PMH of COPD, tobacco use disorder, PAH, diastolic CHF, OSA on CPAP, HTN, hypothyroidism, anxiety and depression presenting with shortness of breath and cough and admitted for acute respiratory failure with hypoxia due to COPD exacerbation.  WBC 16.4.  CXR with bronchitic changes and PAH.  BNP, serial troponin and Pro-Cal negative. She was a started on IV antibiotics, systemic steroid and nebulizers.   Assessment and Plan: Acute on chronic respiratory failure with hypoxia due to COPD exacerbation- SOB and cough for 4 to 5 days.  Desaturated to 84% on home 2.5 L requiring 4 to 5 L for exertional tasks.  Is on home 2.5 L at rest. Still with DOE, rhonchi and expiratory wheeze on exam.  CXR with bronchitic changes and signs of PAH.  BNP, serial troponin and Pro-Cal  negative. Improving s/p 3 days azithromycin 500. S/p 4 days ceftriaxone, completed 5 days, decrease steroids. Stop solumedrol 80 bid, start prednisone 40 qd. cont home trelegy. Albuterol standing q6 and prn.  Supplemental O2 inhalation was weaned off, currently saturating well on room air. Encouraged to quit smoking cigarettes.  sputum for culture.  Patient was recommended to follow with pulmonologist as an outpatient. # Chest/epigastric discomfort: Not able to describe.  No tenderness to palpation. BNP, serial troponin and EKG without acute finding.  Clinically low suspicion for PE. TTE unremarkable. May be gerd. Managed COPD exacerbation as above.  Patient remained asymptomatic, denies any chest pain during hospital stay. # GERD, Pt reports hx of gerd and says chest symptoms are similar to prior gerd. Was previously on omeprazole and requests to re-start ppi. continued pantop at d/c # Pulmonary hypertension: R/LHC in 2020 with no significant obstructive CAD but moderate PAH with wedge pressure of 13 mmHg.  Likely cor pulmonale. Continue home torsemide. outpt cardiology ([facility]) f/u # Hypokalemia secondary to torsemide, patient is on torsemide due to pulmonary hypertension Patient takes potassium chloride 20 mg p.o. twice daily at home which has been restarted Potassium repleted.  # Hypothyroidism, Continue home Synthroid # Anxiety/depression/chronic pain syndrome: Stable, Continue home meds # OSA on CPAP, Continue nightly BiPAP. # Tobacco use disorder, Cessation counseling given, s/p nicotine patch  Body mass index is 32.01 kg/m.  Nutrition Interventions:  Patient was ambulatory without any assistance. On the day of the discharge the patient's vitals were stable, and no  other acute medical condition were reported by patient. the patient was felt safe to be discharge at Home   Consultants: None Procedures: None  Discharge Exam: General: Appear in no distress, no Rash; Oral Mucosa Clear,  moist. Cardiovascular: S1 and S2 Present, no Murmur, Respiratory: normal respiratory effort, Bilateral Air entry present and no Crackles, mild wheezes Abdomen: Bowel Sound present, Soft and no tenderness, no hernia Extremities: no Pedal edema, no calf tenderness Neurology: alert and oriented to time, place, and person affect appropriate.  Filed Weights   08/06/21 1539  Weight: 79.4 kg   Vitals:   08/12/21 0818 08/12/21 1111  BP: 121/71 121/71  Pulse: 77 90  Resp: 16 20  Temp: 98 F (36.7 C) 99 F (37.2 C)  SpO2: 94% 91%    DISCHARGE MEDICATION: Allergies as of 08/12/2021       Reactions   Codeine Nausea And Vomiting   Meloxicam Other (See Comments)   Stomach pain        Medication List     STOP taking these medications    diazepam 10 MG tablet Commonly known as: VALIUM   digoxin 0.25 MG tablet Commonly known as: LANOXIN   doxycycline 100 MG tablet Commonly known as: VIBRA-TABS   predniSONE 50 MG tablet Commonly known as: DELTASONE Replaced by: predniSONE 10 MG (21) Tbpk tablet       TAKE these medications    albuterol 108 (90 Base) MCG/ACT inhaler Commonly known as: VENTOLIN HFA Inhale 2 puffs into the lungs every 6 (six) hours as needed for wheezing or shortness of breath.   aspirin EC 81 MG tablet Take 81 mg by mouth daily.   Cholecalciferol 50 MCG (2000 UT) Caps Take 2,000 Units by mouth daily.   Daliresp 250 MCG Tabs Generic drug: Roflumilast Take 500 mcg by mouth daily as needed.   Fluticasone-Umeclidin-Vilant 100-62.5-25 MCG/INH Aepb Inhale 1 puff into the lungs daily.   guaiFENesin-dextromethorphan 100-10 MG/5ML syrup Commonly known as: ROBITUSSIN DM Take 10 mLs by mouth every 6 (six) hours.   HYDROcodone-acetaminophen 5-325 MG tablet Commonly known as: NORCO/VICODIN Take 1 tablet by mouth every 8 (eight) hours as needed for severe pain. Must last 30 days What changed: Another medication with the same name was removed. Continue  taking this medication, and follow the directions you see here.   ipratropium-albuterol 0.5-2.5 (3) MG/3ML Soln Commonly known as: DUONEB USE 1 VIAL VIA NEBULIZER EVERY 6 HOURS AS NEEDED FOR SHORTNESS OF BREATH OR WHEEZING   levothyroxine 25 MCG tablet Commonly known as: SYNTHROID TAKE 1 TABLET(25 MCG) BY MOUTH DAILY BEFORE BREAKFAST   OXYGEN Inhale 2.5 L/min into the lungs.   pantoprazole 40 MG tablet Commonly known as: PROTONIX Take 1 tablet (40 mg total) by mouth daily. Start taking on: August 13, 2021   potassium chloride SA 20 MEQ tablet Commonly known as: KLOR-CON TAKE 1 TABLET(20 MEQ) BY MOUTH TWICE DAILY   predniSONE 10 MG (21) Tbpk tablet Commonly known as: STERAPRED UNI-PAK 21 TAB 40 mg po daily x 2 days, 30 mg po daily x 2 days, 20 mg po daily x 2 days, 10 mg po daily x 3 days Replaces: predniSONE 50 MG tablet   sertraline 100 MG tablet Commonly known as: ZOLOFT TAKE 2 TABLETS BY MOUTH EVERY MORNING   simvastatin 20 MG tablet Commonly known as: ZOCOR Take 1 tablet (20 mg total) by mouth at bedtime.   tolterodine 4 MG 24 hr capsule Commonly known as: DETROL LA Take 1 capsule (  4 mg total) by mouth daily.   torsemide 20 MG tablet Commonly known as: DEMADEX Take 2 tablets (40 mg total) by mouth 2 (two) times daily.       Allergies  Allergen Reactions   Codeine Nausea And Vomiting   Meloxicam Other (See Comments)    Stomach pain   Discharge Instructions     Call MD for:  difficulty breathing, headache or visual disturbances   Complete by: As directed    Call MD for:  extreme fatigue   Complete by: As directed    Call MD for:  persistant dizziness or light-headedness   Complete by: As directed    Call MD for:  severe uncontrolled pain   Complete by: As directed    Call MD for:  temperature >100.4   Complete by: As directed    Diet - low sodium heart healthy   Complete by: As directed    Discharge instructions   Complete by: As directed     Follow with PCP in 1 week Follow with pulmonologist in 1 week   Increase activity slowly   Complete by: As directed        The results of significant diagnostics from this hospitalization (including imaging, microbiology, ancillary and laboratory) are listed below for reference.    Significant Diagnostic Studies: DG Chest 2 View  Result Date: 08/06/2021 CLINICAL DATA:  Dyspnea.  Productive cough.  COPD.  CHF. EXAM: CHEST - 2 VIEW COMPARISON:  Chest x-ray 06/23/2021, CT chest 06/23/2021. FINDINGS: The heart and mediastinal contours are unchanged. Aortic calcification. Prominent pulmonary arteries. Hyperinflation of the lungs. No focal consolidation. Coarsened interstitial markings with no overt pulmonary edema. No pleural effusion. No pneumothorax. No acute osseous abnormality. IMPRESSION: 1. Chronic bronchitic lung changes with acute superimposed infection/inflammation not excluded. 2. Aortic Atherosclerosis (ICD10-I70.0) and Emphysema (ICD10-J43.9). 3. Persistent prominent pulmonary artery suggestive of pulmonary hypertension. Electronically Signed   By: Iven Finn M.D.   On: 08/06/2021 16:24   ECHOCARDIOGRAM COMPLETE  Result Date: 08/09/2021    ECHOCARDIOGRAM REPORT   Patient Name:   [facility] Date of Exam: 08/08/2021 Medical Rec #:  [id number]      Height:       62.0 in Accession #:    [id number]     Weight:       175.0 lb Date of Birth:  Dec 29, 1955       BSA:          1.806 m Patient Age:    16 years       BP:           107/64 mmHg Patient Gender: F              HR:           99 bpm. Exam Location:  [facility] Procedure: 2D Echo, Cardiac Doppler and Color Doppler Indications:     Chest pain R07.9  History:         Patient has prior history of Echocardiogram examinations, most                  recent 07/07/2019. CHF; COPD.  Sonographer:     Sherrie Sport Referring Phys:  [id number] Charlesetta Ivory GONFA Diagnosing Phys: Kathlyn Sacramento MD  Sonographer Comments: Suboptimal parasternal window. IMPRESSIONS  1.  Left ventricular ejection fraction, by estimation, is 60 to 65%. The left ventricle has normal function. The left ventricle has no regional wall motion abnormalities. Left ventricular diastolic parameters were normal.  2. Right ventricular systolic function is normal. The right ventricular size is normal. Tricuspid regurgitation signal is inadequate for assessing PA pressure.  3. Left atrial size was mildly dilated.  4. Right atrial size was mildly dilated.  5. The mitral valve is normal in structure. No evidence of mitral valve regurgitation. No evidence of mitral stenosis.  6. The aortic valve is normal in structure. Aortic valve regurgitation is trivial. No aortic stenosis is present.  7. The inferior vena cava is dilated in size with >50% respiratory variability, suggesting right atrial pressure of 8 mmHg. FINDINGS  Left Ventricle: Left ventricular ejection fraction, by estimation, is 60 to 65%. The left ventricle has normal function. The left ventricle has no regional wall motion abnormalities. The left ventricular internal cavity size was normal in size. There is  no left ventricular hypertrophy. Left ventricular diastolic parameters were normal. Right Ventricle: The right ventricular size is normal. No increase in right ventricular wall thickness. Right ventricular systolic function is normal. Tricuspid regurgitation signal is inadequate for assessing PA pressure. The tricuspid regurgitant velocity is 2.20 m/s, and with an assumed right atrial pressure of 8 mmHg, the estimated right ventricular systolic pressure is 30.6 mmHg. Left Atrium: Left atrial size was mildly dilated. Right Atrium: Right atrial size was mildly dilated. Pericardium: There is no evidence of pericardial effusion. Mitral Valve: The mitral valve is normal in structure. No evidence of mitral valve regurgitation. No evidence of mitral valve stenosis. MV peak gradient, 3.7 mmHg. The mean mitral valve gradient is 2.0 mmHg. Tricuspid Valve: The  tricuspid valve is normal in structure. Tricuspid valve regurgitation is trivial. No evidence of tricuspid stenosis. Aortic Valve: The aortic valve is normal in structure. Aortic valve regurgitation is trivial. No aortic stenosis is present. Aortic valve mean gradient measures 4.0 mmHg. Aortic valve peak gradient measures 10.1 mmHg. Aortic valve area, by VTI measures 3.13 cm. Pulmonic Valve: The pulmonic valve was normal in structure. Pulmonic valve regurgitation is trivial. No evidence of pulmonic stenosis. Aorta: The aortic root is normal in size and structure. Venous: The inferior vena cava is dilated in size with greater than 50% respiratory variability, suggesting right atrial pressure of 8 mmHg. IAS/Shunts: No atrial level shunt detected by color flow Doppler.  LEFT VENTRICLE PLAX 2D LVIDd:         4.30 cm   Diastology LVIDs:         2.70 cm   LV e' medial:    7.62 cm/s LV PW:         1.10 cm   LV E/e' medial:  9.4 LV IVS:        0.80 cm   LV e' lateral:   9.14 cm/s LVOT diam:     2.10 cm   LV E/e' lateral: 7.9 LV SV:         78 LV SV Index:   43 LVOT Area:     3.46 cm  RIGHT VENTRICLE RV S prime:     13.60 cm/s TAPSE (M-mode): 4.6 cm LEFT ATRIUM             Index        RIGHT ATRIUM           Index LA diam:        4.00 cm 2.21 cm/m   RA Area:     27.30 cm LA Vol (A2C):   84.7 ml 46.89 ml/m  RA Volume:   105.00 ml 58.13 ml/m LA Vol (A4C):  35.5 ml 19.65 ml/m LA Biplane Vol: 54.6 ml 30.23 ml/m  AORTIC VALVE                    PULMONIC VALVE AV Area (Vmax):    2.46 cm     PV Vmax:        0.55 m/s AV Area (Vmean):   2.94 cm     PV Vmean:       35.400 cm/s AV Area (VTI):     3.13 cm     PV VTI:         0.117 m AV Vmax:           159.00 cm/s  PV Peak grad:   1.2 mmHg AV Vmean:          89.800 cm/s  PV Mean grad:   1.0 mmHg AV VTI:            0.248 m      RVOT Peak grad: 2 mmHg AV Peak Grad:      10.1 mmHg AV Mean Grad:      4.0 mmHg LVOT Vmax:         113.00 cm/s LVOT Vmean:        76.300 cm/s LVOT VTI:           0.224 m LVOT/AV VTI ratio: 0.90  AORTA Ao Root diam: 3.40 cm MITRAL VALVE               TRICUSPID VALVE MV Area (PHT): 4.08 cm    TR Peak grad:   19.4 mmHg MV Area VTI:   3.16 cm    TR Vmax:        220.00 cm/s MV Peak grad:  3.7 mmHg MV Mean grad:  2.0 mmHg    SHUNTS MV Vmax:       0.96 m/s    Systemic VTI:  0.22 m MV Vmean:      73.8 cm/s   Systemic Diam: 2.10 cm MV Decel Time: 186 msec    Pulmonic VTI:  0.121 m MV E velocity: 72.00 cm/s MV A velocity: 94.30 cm/s MV E/A ratio:  0.76 Kathlyn Sacramento MD Electronically signed by Kathlyn Sacramento MD Signature Date/Time: 08/09/2021/12:59:40 PM    Final     Microbiology: Recent Results (from the past 240 hour(s))  Resp Panel by RT-PCR (Flu A&B, Covid) Nasopharyngeal Swab     Status: None   Collection Time: 08/06/21  3:51 PM   Specimen: Nasopharyngeal Swab; Nasopharyngeal(NP) swabs in vial transport medium  Result Value Ref Range Status   SARS Coronavirus 2 by RT PCR NEGATIVE NEGATIVE Final    Comment: (NOTE) SARS-CoV-2 target nucleic acids are NOT DETECTED.  The SARS-CoV-2 RNA is generally detectable in upper respiratory specimens during the acute phase of infection. The lowest concentration of SARS-CoV-2 viral copies this assay can detect is 138 copies/mL. A negative result does not preclude SARS-Cov-2 infection and should not be used as the sole basis for treatment or other patient management decisions. A negative result may occur with  improper specimen collection/handling, submission of specimen other than nasopharyngeal swab, presence of viral mutation(s) within the areas targeted by this assay, and inadequate number of viral copies(<138 copies/mL). A negative result must be combined with clinical observations, patient history, and epidemiological information. The expected result is Negative.  Fact Sheet for Patients:  EntrepreneurPulse.com.au  Fact Sheet for Healthcare Providers:   IncredibleEmployment.be  This test is no t yet approved or cleared by  the [employer] and  has been authorized for detection and/or diagnosis of SARS-CoV-2 by FDA under an Emergency Use Authorization (EUA). This EUA will remain  in effect (meaning this test can be used) for the duration of the COVID-19 declaration under Section 564(b)(1) of the Act, 21 U.S.C.section 360bbb-3(b)(1), unless the authorization is terminated  or revoked sooner.       Influenza A by PCR NEGATIVE NEGATIVE Final   Influenza B by PCR NEGATIVE NEGATIVE Final    Comment: (NOTE) The Xpert Xpress SARS-CoV-2/FLU/RSV plus assay is intended as an aid in the diagnosis of influenza from Nasopharyngeal swab specimens and should not be used as a sole basis for treatment. Nasal washings and aspirates are unacceptable for Xpert Xpress SARS-CoV-2/FLU/RSV testing.  Fact Sheet for Patients: EntrepreneurPulse.com.au  Fact Sheet for Healthcare Providers: IncredibleEmployment.be  This test is not yet approved or cleared by the [place] FDA and has been authorized for detection and/or diagnosis of SARS-CoV-2 by FDA under an Emergency Use Authorization (EUA). This EUA will remain in effect (meaning this test can be used) for the duration of the COVID-19 declaration under Section 564(b)(1) of the Act, 21 U.S.C. section 360bbb-3(b)(1), unless the authorization is terminated or revoked.  Performed at [facility], [address] [place], [place] [address]   Blood culture (routine x 2)     Status: None   Collection Time: 08/06/21  5:48 PM   Specimen: BLOOD  Result Value Ref Range Status   Specimen Description BLOOD BLOOD RIGHT FOREARM  Final   Special Requests   Final    BOTTLES DRAWN AEROBIC AND ANAEROBIC Blood Culture results may not be optimal due to an inadequate volume of blood received in culture bottles   Culture   Final    NO GROWTH 5  DAYS Performed at [facility], [address], [place], [place] [address]    Report Status 08/11/2021 FINAL  Final  Group A Strep by PCR     Status: None   Collection Time: 08/06/21  8:02 PM   Specimen: Throat; Sterile Swab  Result Value Ref Range Status   Group A Strep by PCR NOT DETECTED NOT DETECTED Final    Comment: Performed at [facility], [address] [place], [place] [address]  Blood culture (routine x 2)     Status: None   Collection Time: 08/06/21  8:22 PM   Specimen: BLOOD  Result Value Ref Range Status   Specimen Description BLOOD LAAC  Final   Special Requests   Final    BOTTLES DRAWN AEROBIC AND ANAEROBIC Blood Culture results may not be optimal due to an inadequate volume of blood received in culture bottles   Culture   Final    NO GROWTH 5 DAYS Performed at [facility], [address], [place],  [address]    Report Status 08/11/2021 FINAL  Final  Expectorated Sputum Assessment w Gram Stain, Rflx to Resp Cult     Status: None   Collection Time: 08/10/21 12:49 PM   Specimen: Expectorated Sputum  Result Value Ref Range Status   Specimen Description EXPECTORATED SPUTUM  Final   Special Requests NONE  Final   Sputum evaluation   Final    THIS SPECIMEN IS ACCEPTABLE FOR SPUTUM CULTURE Performed at [facility], [address], [place],  [address]    Report Status 08/10/2021 FINAL  Final  Culture, Respiratory w Gram Stain     Status: None (Preliminary result)  Collection Time: 08/10/21 12:49 PM  Result Value Ref Range Status   Specimen Description   Final    EXPECTORATED SPUTUM Performed at [facility], [address] [place], [place] [address]    Special Requests   Final    NONE Reflexed from [id number] Performed at [facility], [place], [place] [address]    Gram Stain   Final    RARE SQUAMOUS EPITHELIAL CELLS PRESENT MODERATE WBC PRESENT,BOTH PMN AND  MONONUCLEAR RARE YEAST    Culture   Final    RARE Normal respiratory flora-no Staph aureus or Pseudomonas seen Performed at [facility], [address], [place], [place] [address]    Report Status PENDING  Incomplete     Labs: CBC: Recent Labs  Lab 08/06/21 1548 08/07/21 0646 08/08/21 0344 08/10/21 0539 08/11/21 0410 08/12/21 0542  WBC 16.4* 9.6 13.0* 11.0* 11.5* 11.4*  NEUTROABS 14.3*  --   --   --   --   --   HGB 16.2* 15.6* 14.8 16.2* 15.3* 15.6*  HCT 47.1* 47.3* 45.9 50.3* 46.4* 48.0*  MCV 96.9 100.0 98.5 97.7 96.9 96.4  PLT 168 165 161 214 202 568   Basic Metabolic Panel: Recent Labs  Lab 08/07/21 0646 08/08/21 0344 08/10/21 0539 08/11/21 0410 08/12/21 0542  NA 141 141 141 139 141  K 3.8 3.9 4.6 3.0* 3.6  CL 99 98 95* 97* 98  CO2 32 34* 36* 35* 34*  GLUCOSE 131* 160* 138* 115* 109*  BUN _0 CREATININE 0.61 0.60 0.67 0.63 0.64  CALCIUM 9.0 8.7* 9.2 8.4* 8.9  MG  --  2.3  --  2.5* 2.6*  PHOS  --  3.3  --  2.8 3.7   Liver Function Tests: Recent Labs  Lab 08/08/21 0344  ALBUMIN 3.2*   No results for input(s): LIPASE, AMYLASE in the last 168 hours. No results for input(s): AMMONIA in the last 168 hours. Cardiac Enzymes: No results for input(s): CKTOTAL, CKMB, CKMBINDEX, TROPONINI in the last 168 hours. BNP (last 3 results) Recent Labs    02/04/21 0045 08/06/21 1548  BNP 59.7 36.1   CBG: No results for input(s): GLUCAP in the last 168 hours.  Time spent: 35 minutes  Signed:  Val Riles  [facility]  08/12/2021 11:23 AM

## 2021-08-12 NOTE — Progress Notes (Signed)
Pt discharging home, discharge instructions reviewed with pt and family, states understanding, pt with no complaints at discharge, o2 in place

## 2021-08-13 ENCOUNTER — Telehealth: Payer: Self-pay | Admitting: Family Medicine

## 2021-08-13 ENCOUNTER — Other Ambulatory Visit: Payer: Self-pay

## 2021-08-13 DIAGNOSIS — J441 Chronic obstructive pulmonary disease with (acute) exacerbation: Secondary | ICD-10-CM

## 2021-08-13 DIAGNOSIS — J9621 Acute and chronic respiratory failure with hypoxia: Secondary | ICD-10-CM | POA: Diagnosis not present

## 2021-08-13 DIAGNOSIS — I5032 Chronic diastolic (congestive) heart failure: Secondary | ICD-10-CM | POA: Diagnosis not present

## 2021-08-13 DIAGNOSIS — I110 Hypertensive heart disease with heart failure: Secondary | ICD-10-CM | POA: Diagnosis not present

## 2021-08-13 DIAGNOSIS — G4730 Sleep apnea, unspecified: Secondary | ICD-10-CM | POA: Diagnosis not present

## 2021-08-13 DIAGNOSIS — F32A Depression, unspecified: Secondary | ICD-10-CM | POA: Diagnosis not present

## 2021-08-13 DIAGNOSIS — F411 Generalized anxiety disorder: Secondary | ICD-10-CM | POA: Diagnosis not present

## 2021-08-13 DIAGNOSIS — E039 Hypothyroidism, unspecified: Secondary | ICD-10-CM | POA: Diagnosis not present

## 2021-08-13 DIAGNOSIS — I272 Pulmonary hypertension, unspecified: Secondary | ICD-10-CM | POA: Diagnosis not present

## 2021-08-13 LAB — CULTURE, RESPIRATORY W GRAM STAIN: Culture: NORMAL

## 2021-08-13 NOTE — Telephone Encounter (Signed)
This patient was discharged 10/27 and needs a hospital follow up within 2 weeks. but PEC scheduled for 11/14. Please see if she can come in on 11/4 at 1pm for 40 minute hospital follow up visit instead

## 2021-08-16 DIAGNOSIS — E039 Hypothyroidism, unspecified: Secondary | ICD-10-CM | POA: Diagnosis not present

## 2021-08-16 DIAGNOSIS — J441 Chronic obstructive pulmonary disease with (acute) exacerbation: Secondary | ICD-10-CM | POA: Diagnosis not present

## 2021-08-16 DIAGNOSIS — I5032 Chronic diastolic (congestive) heart failure: Secondary | ICD-10-CM | POA: Diagnosis not present

## 2021-08-16 DIAGNOSIS — J9621 Acute and chronic respiratory failure with hypoxia: Secondary | ICD-10-CM | POA: Diagnosis not present

## 2021-08-16 DIAGNOSIS — F32A Depression, unspecified: Secondary | ICD-10-CM | POA: Diagnosis not present

## 2021-08-16 DIAGNOSIS — G4730 Sleep apnea, unspecified: Secondary | ICD-10-CM | POA: Diagnosis not present

## 2021-08-16 DIAGNOSIS — I110 Hypertensive heart disease with heart failure: Secondary | ICD-10-CM | POA: Diagnosis not present

## 2021-08-16 DIAGNOSIS — I272 Pulmonary hypertension, unspecified: Secondary | ICD-10-CM | POA: Diagnosis not present

## 2021-08-16 DIAGNOSIS — F411 Generalized anxiety disorder: Secondary | ICD-10-CM | POA: Diagnosis not present

## 2021-08-17 ENCOUNTER — Telehealth: Payer: Self-pay | Admitting: Family Medicine

## 2021-08-17 NOTE — Telephone Encounter (Signed)
That's fine

## 2021-08-17 NOTE — Telephone Encounter (Signed)
Left message advising [facility].   Thanks,   -Mickel Baas

## 2021-08-17 NOTE — Telephone Encounter (Signed)
Home Health Verbal Orders - Caller/Agency: [facility] / [facility]  Callback Number: [phone] Requesting OT Frequency: 2x's a week for 1 week 1x a week for 5 weeks

## 2021-08-18 ENCOUNTER — Telehealth: Payer: Self-pay

## 2021-08-18 DIAGNOSIS — E039 Hypothyroidism, unspecified: Secondary | ICD-10-CM

## 2021-08-18 MED ORDER — LEVOTHYROXINE SODIUM 25 MCG PO TABS: ORAL_TABLET | ORAL | 0 refills | Status: DC

## 2021-08-18 NOTE — Telephone Encounter (Signed)
[facility] faxed refill request for the following medications:  levothyroxine (SYNTHROID) 25 MCG tablet   Please advise.

## 2021-08-19 DIAGNOSIS — F411 Generalized anxiety disorder: Secondary | ICD-10-CM | POA: Diagnosis not present

## 2021-08-19 DIAGNOSIS — G4730 Sleep apnea, unspecified: Secondary | ICD-10-CM | POA: Diagnosis not present

## 2021-08-19 DIAGNOSIS — J441 Chronic obstructive pulmonary disease with (acute) exacerbation: Secondary | ICD-10-CM | POA: Diagnosis not present

## 2021-08-19 DIAGNOSIS — I272 Pulmonary hypertension, unspecified: Secondary | ICD-10-CM | POA: Diagnosis not present

## 2021-08-19 DIAGNOSIS — E039 Hypothyroidism, unspecified: Secondary | ICD-10-CM | POA: Diagnosis not present

## 2021-08-19 DIAGNOSIS — F32A Depression, unspecified: Secondary | ICD-10-CM | POA: Diagnosis not present

## 2021-08-19 DIAGNOSIS — I5032 Chronic diastolic (congestive) heart failure: Secondary | ICD-10-CM | POA: Diagnosis not present

## 2021-08-19 DIAGNOSIS — J9621 Acute and chronic respiratory failure with hypoxia: Secondary | ICD-10-CM | POA: Diagnosis not present

## 2021-08-19 DIAGNOSIS — I110 Hypertensive heart disease with heart failure: Secondary | ICD-10-CM | POA: Diagnosis not present

## 2021-08-19 IMAGING — DX DG CHEST 1V PORT
1 series · 1 of 1 positions shown · non-contrast
Comparison: 06/26/2020

CLINICAL DATA: Shortness of breath

EXAM:
PORTABLE CHEST 1 VIEW

[chest ap]
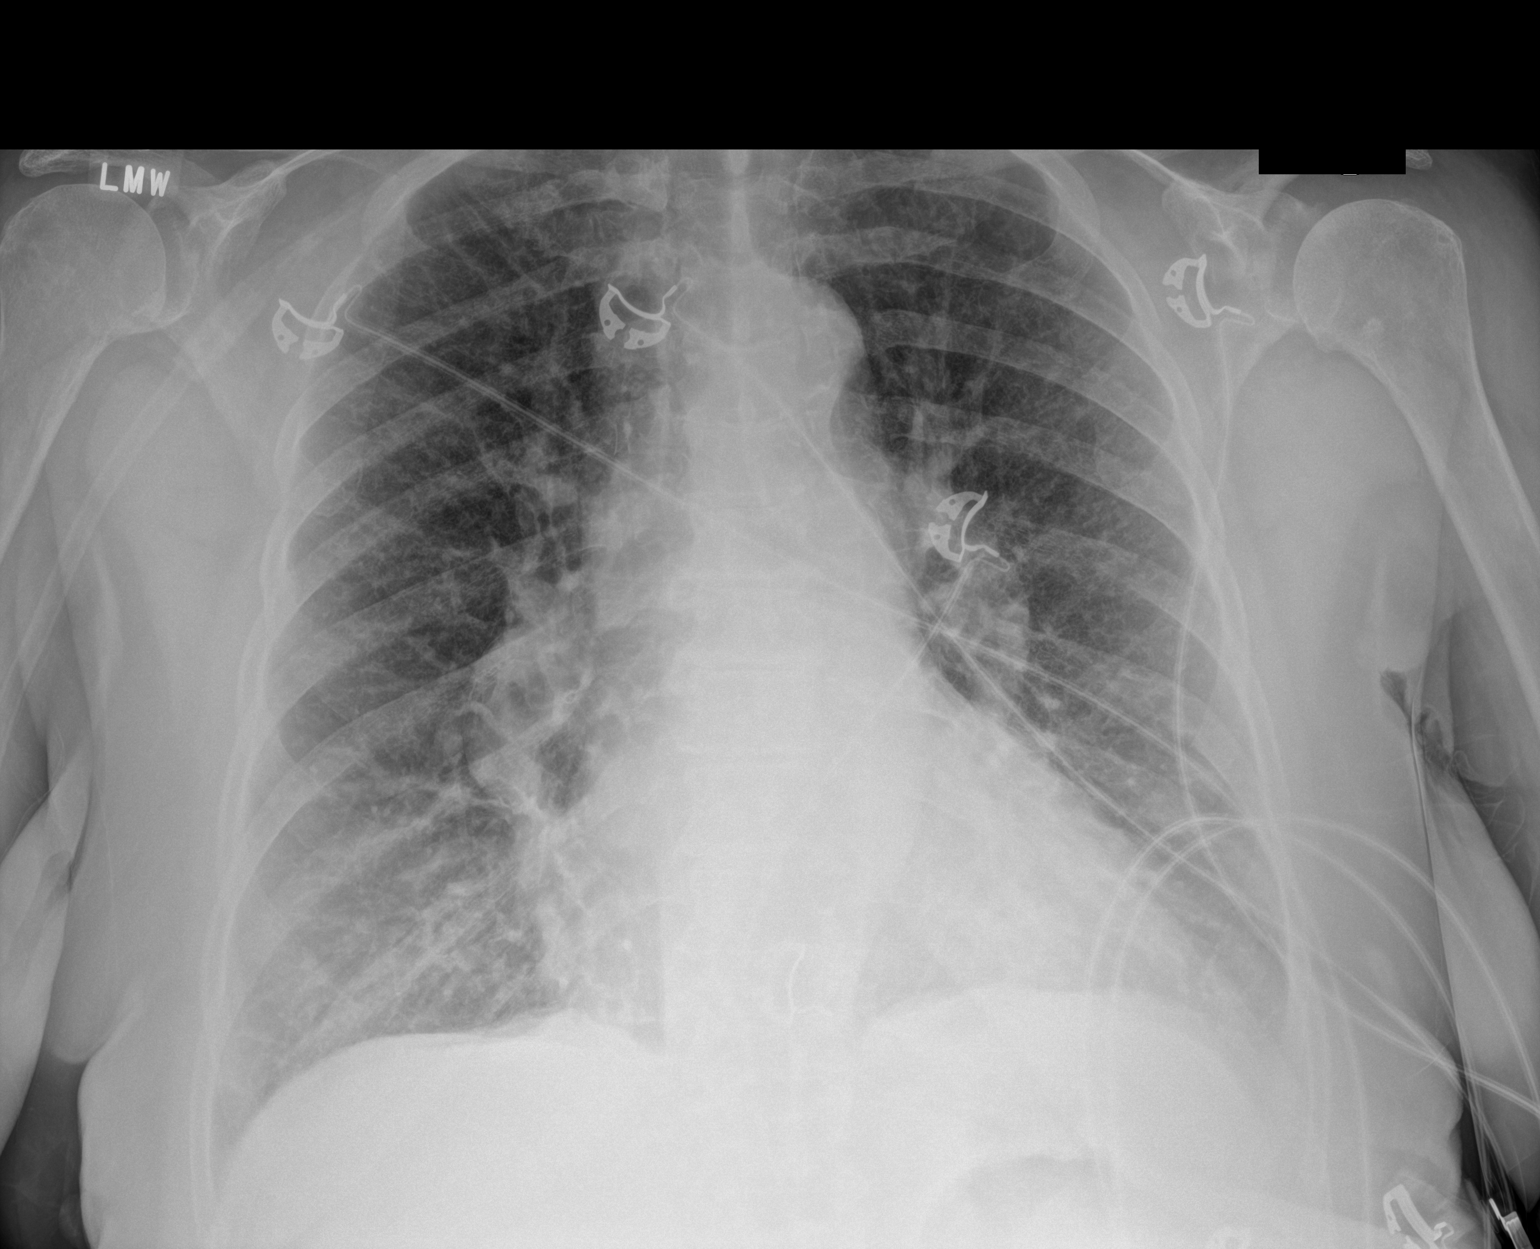

[1 of 1 positions shown; findings below may reference images not displayed]

FINDINGS: Cardiac shadow is within normal limits. Aortic calcifications are
noted. Mild vascular congestion is noted with very mild interstitial
edema. No sizable effusion is noted. No confluent infiltrate is
seen. No bony abnormality is noted.
IMPRESSION: Mild vascular congestion and interstitial edema.

## 2021-08-20 ENCOUNTER — Telehealth: Payer: Self-pay | Admitting: *Deleted

## 2021-08-20 ENCOUNTER — Ambulatory Visit (INDEPENDENT_AMBULATORY_CARE_PROVIDER_SITE_OTHER): Payer: Medicare HMO | Admitting: Family Medicine

## 2021-08-20 ENCOUNTER — Other Ambulatory Visit: Payer: Self-pay

## 2021-08-20 ENCOUNTER — Encounter: Payer: Self-pay | Admitting: Family Medicine

## 2021-08-20 VITALS — BP 117/65 | HR 102 | Temp 98.7°F | Wt 183.0 lb

## 2021-08-20 DIAGNOSIS — J441 Chronic obstructive pulmonary disease with (acute) exacerbation: Secondary | ICD-10-CM

## 2021-08-20 DIAGNOSIS — E876 Hypokalemia: Secondary | ICD-10-CM

## 2021-08-20 NOTE — Chronic Care Management (AMB) (Signed)
  Chronic Care Management   Note  08/20/2021 Name: Dacey Milberger MRN: [id number] DOB: 09/02/1956  Demetric Parslow is a 65 y.o. year old female who is a primary care patient of Pcp, No. I reached out to [name] by phone today in response to a referral sent by Ms. Melinda Crutch PCP.  Ms. Ruminski was given information about Chronic Care Management services today including:  CCM service includes personalized support from designated clinical staff supervised by her physician, including individualized plan of care and coordination with other care providers 24/7 contact phone numbers for assistance for urgent and routine care needs. Service will only be billed when office clinical staff spend 20 minutes or more in a month to coordinate care. Only one practitioner may furnish and bill the service in a calendar month. The patient may stop CCM services at any time (effective at the end of the month) by phone call to the office staff. The patient is responsible for co-pay (up to 20% after annual deductible is met) if co-pay is required by the individual health plan.   Patient agreed to services and verbal consent obtained.   Follow up plan: Telephone appointment with care management team member scheduled for: 09/06/2021  Julian Hy, [facility] [facility]  Direct Dial: [phone]

## 2021-08-20 NOTE — Progress Notes (Signed)
Established patient visit   Patient: Theresa [name]   DOB: Jul 06, 1956   65 y.o. Female  MRN: [id number] Visit Date: 08/20/2021  Today's healthcare provider: Lelon Huh, MD   Chief Complaint  Patient presents with   Hospitalization Follow-up   COPD   Subjective    HPI  Follow up Hospitalization  Patient was admitted to [facility] on 08/07/2021 and discharged on 08/12/2021. She was treated for COPD exacerbation. Treatment for this included see hospital note. Telephone follow up was done on N/A She reports excellent compliance with treatment. She reports this condition is improved. Pt states she is better but not back to base line.  Is back to her baseline resting oxygen of 2.5 lpm. Doing well on baseline inhalers. Still has a week of prednisone.  -----------------------------------------------------------------------------------------    Medications: Outpatient Medications Prior to Visit  Medication Sig   albuterol (VENTOLIN HFA) 108 (90 Base) MCG/ACT inhaler Inhale 2 puffs into the lungs every 6 (six) hours as needed for wheezing or shortness of breath.   aspirin EC 81 MG tablet Take 81 mg by mouth daily.   Cholecalciferol 50 MCG (2000 UT) CAPS Take 2,000 Units by mouth daily.    Fluticasone-Umeclidin-Vilant 100-62.5-25 MCG/INH AEPB Inhale 1 puff into the lungs daily.   guaiFENesin-dextromethorphan (ROBITUSSIN DM) 100-10 MG/5ML syrup Take 10 mLs by mouth every 6 (six) hours.   HYDROcodone-acetaminophen (NORCO/VICODIN) 5-325 MG tablet Take 1 tablet by mouth every 8 (eight) hours as needed for severe pain. Must last 30 days   ipratropium-albuterol (DUONEB) 0.5-2.5 (3) MG/3ML SOLN USE 1 VIAL VIA NEBULIZER EVERY 6 HOURS AS NEEDED FOR SHORTNESS OF BREATH OR WHEEZING   levothyroxine (SYNTHROID) 25 MCG tablet TAKE 1 TABLET(25 MCG) BY MOUTH DAILY BEFORE BREAKFAST   OXYGEN Inhale 2.5 L/min into the lungs.   pantoprazole (PROTONIX) 40 MG tablet Take 1 tablet (40 mg total) by mouth  daily.   potassium chloride SA (KLOR-CON) 20 MEQ tablet TAKE 1 TABLET(20 MEQ) BY MOUTH TWICE DAILY   predniSONE (STERAPRED UNI-PAK 21 TAB) 10 MG (21) TBPK tablet 40 mg po daily x 2 days, 30 mg po daily x 2 days, 20 mg po daily x 2 days, 10 mg po daily x 3 days   Roflumilast (DALIRESP) 250 MCG TABS Take 500 mcg by mouth daily as needed.   sertraline (ZOLOFT) 100 MG tablet TAKE 2 TABLETS BY MOUTH EVERY MORNING   simvastatin (ZOCOR) 20 MG tablet Take 1 tablet (20 mg total) by mouth at bedtime.   tolterodine (DETROL LA) 4 MG 24 hr capsule Take 1 capsule (4 mg total) by mouth daily.   torsemide (DEMADEX) 20 MG tablet Take 2 tablets (40 mg total) by mouth 2 (two) times daily.   No facility-administered medications prior to visit.    Review of Systems  Constitutional: Negative.   Respiratory:  Positive for cough, shortness of breath and wheezing. Negative for apnea, choking, chest tightness and stridor.   Cardiovascular: Negative.   Gastrointestinal: Negative.   Neurological:  Negative for dizziness, light-headedness and headaches.      Objective    BP 117/65 (BP Location: Right Arm, Patient Position: Sitting, Cuff Size: Large)   Pulse (!) 102   Temp 98.7 F (37.1 C) (Oral)   Wt 183 lb (83 kg)   SpO2 97% Comment: On 2.5 liters of O2  BMI 33.47 kg/m    Physical Exam   General: Appearance:    Mildly obese female in no acute distress  Eyes:  PERRL, conjunctiva/corneas clear, EOM's intact       Lungs:     Clear to auscultation bilaterally, respirations unlabored  Heart:    Tachycardic. Normal rhythm. No murmurs, rubs, or gallops.    MS:   All extremities are intact.    Neurologic:   Awake, alert, oriented x 3. No apparent focal neurological defect.         Assessment & Plan     1. COPD exacerbation ([place]) Greatly improved. On last week of prednisone taper. Is to return in 2 weeks for flu vaccine. Continue current inhalers. Follow up pulmonary in December as scheduled.   2.  Hypokalemia  - Renal function panel - Magnesium - CBC      The entirety of the information documented in the History of Present Illness, Review of Systems and Physical Exam were personally obtained by me. Portions of this information were initially documented by the CMA and reviewed by me for thoroughness and accuracy.     Lelon Huh, MD  [facility] [phone] (phone) [phone] (fax)  [place]

## 2021-08-21 LAB — RENAL FUNCTION PANEL
Albumin: 4.3 g/dL (ref 3.8–4.8)
BUN/Creatinine Ratio: 16 (ref 12–28)
BUN: 12 mg/dL (ref 8–27)
CO2: 26 mmol/L (ref 20–29)
Calcium: 9.5 mg/dL (ref 8.7–10.3)
Chloride: 101 mmol/L (ref 96–106)
Creatinine, Ser: 0.76 mg/dL (ref 0.57–1.00)
Glucose: 132 mg/dL — ABNORMAL HIGH (ref 70–99)
Phosphorus: 2.9 mg/dL — ABNORMAL LOW (ref 3.0–4.3)
Potassium: 4.2 mmol/L (ref 3.5–5.2)
Sodium: 146 mmol/L — ABNORMAL HIGH (ref 134–144)
eGFR: 87 mL/min/{1.73_m2} (ref >59)

## 2021-08-21 LAB — CBC
Hematocrit: 46.9 % — ABNORMAL HIGH (ref 34.0–46.6)
Hemoglobin: 15.9 g/dL (ref 11.1–15.9)
MCH: 31.8 pg (ref 26.6–33.0)
MCHC: 33.9 g/dL (ref 31.5–35.7)
MCV: 94 fL (ref 79–97)
Platelets: 216 10*3/uL (ref 150–450)
RBC: 5 x10E6/uL (ref 3.77–5.28)
RDW: 14.7 % (ref 11.7–15.4)
WBC: 13.3 10*3/uL — ABNORMAL HIGH (ref 3.4–10.8)

## 2021-08-21 LAB — MAGNESIUM: Magnesium: 2.4 mg/dL — ABNORMAL HIGH (ref 1.6–2.3)

## 2021-08-22 DIAGNOSIS — J9610 Chronic respiratory failure, unspecified whether with hypoxia or hypercapnia: Secondary | ICD-10-CM | POA: Diagnosis not present

## 2021-08-24 DIAGNOSIS — I272 Pulmonary hypertension, unspecified: Secondary | ICD-10-CM | POA: Diagnosis not present

## 2021-08-24 DIAGNOSIS — J441 Chronic obstructive pulmonary disease with (acute) exacerbation: Secondary | ICD-10-CM | POA: Diagnosis not present

## 2021-08-24 DIAGNOSIS — F32A Depression, unspecified: Secondary | ICD-10-CM | POA: Diagnosis not present

## 2021-08-24 DIAGNOSIS — F411 Generalized anxiety disorder: Secondary | ICD-10-CM | POA: Diagnosis not present

## 2021-08-24 DIAGNOSIS — I110 Hypertensive heart disease with heart failure: Secondary | ICD-10-CM | POA: Diagnosis not present

## 2021-08-24 DIAGNOSIS — I5032 Chronic diastolic (congestive) heart failure: Secondary | ICD-10-CM | POA: Diagnosis not present

## 2021-08-24 DIAGNOSIS — G4730 Sleep apnea, unspecified: Secondary | ICD-10-CM | POA: Diagnosis not present

## 2021-08-24 DIAGNOSIS — E039 Hypothyroidism, unspecified: Secondary | ICD-10-CM | POA: Diagnosis not present

## 2021-08-24 DIAGNOSIS — J9621 Acute and chronic respiratory failure with hypoxia: Secondary | ICD-10-CM | POA: Diagnosis not present

## 2021-08-30 ENCOUNTER — Inpatient Hospital Stay: Payer: Medicare HMO | Admitting: Family Medicine

## 2021-08-30 NOTE — Progress Notes (Signed)
PROVIDER NOTE: Information contained herein reflects review and annotations entered in association with encounter. Interpretation of such information and data should be left to medically-trained personnel. Information provided to patient can be located elsewhere in the medical record under "Patient Instructions". Document created using STT-dictation technology, any transcriptional errors that may result from process are unintentional.    Patient: Theresa [name]  Service Category: Procedure Provider: Gaspar Cola, MD DOB: [birth year] DOS: 08/31/2021 Location: [facility] MRN: [id number] Setting: Ambulatory - outpatient Referring Provider: No ref. provider found Type: Established Patient Specialty: Interventional Pain Management PCP: Pcp, No  Primary Reason for Visit: Interventional Pain Management Treatment. CC: Back Pain (low)   Procedure:          Anesthesia, Analgesia, Anxiolysis:  Type: Therapeutic Inter-Laminar Epidural Steroid Injection           Region: Lumbar Level: L2-3 Level. Laterality: Left Paramedial  Anesthesia: Local (1-2% Lidocaine)  Anxiolysis: None  Sedation: None  Guidance: Fluoroscopy           Position: Prone with head of the table was raised to facilitate breathing.   Indications: 1. Chronic left-sided lumbar radiculopathy   2. Chronic low back pain (1ry area of Pain) (Bilateral) (L>R) w/o sciatica   3. DDD (degenerative disc disease), lumbar   4. 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 Neuritis or radiculitis due to rupture of lumbar intervertebral disc    Pain Score: Pre-procedure: 6 /10 Post-procedure: 6 /10    Pre-op H&P Assessment:  Ms. Brosch is a 65 y.o. (year old), female patient, seen today for interventional treatment. She  has a past surgical history that includes Abdominal hysterectomy; Tonsillectomy; Carpal tunnel release (Left, 2012); Tubal ligation; epigastric hernia repair (N/A, 08/13/2018); Umbilical hernia repair (N/A,  08/13/2018); and RIGHT/LEFT HEART CATH AND CORONARY ANGIOGRAPHY (N/A, 07/11/2019). Ms. Raigoza has a current medication list which includes the following prescription(s): albuterol, aspirin ec, cholecalciferol, fluticasone-umeclidin-vilant, hydrocodone-acetaminophen, ipratropium-albuterol, levothyroxine, oxygen-helium, potassium chloride sa, daliresp, sertraline, simvastatin, tolterodine, torsemide, guaifenesin-dextromethorphan, pantoprazole, and prednisone, and the following Facility-Administered Medications: iohexol, lidocaine, pentafluoroprop-tetrafluoroeth, ropivacaine (pf) 2 mg/ml (0.2%), sodium chloride flush, and triamcinolone acetonide. Her primarily concern today is the Back Pain (low)  Initial Vital Signs:  Pulse/HCG Rate: 87  Temp: (!) 96.5 F (35.8 C) Resp: 16 BP: 115/74 SpO2: 94 %  BMI: Estimated body mass index is 32.37 kg/m as calculated from the following:   Height as of this encounter: _0  (1.575 m).   Weight as of this encounter: 177 lb (80.3 kg).  Risk Assessment: Allergies: Reviewed. She is allergic to codeine and meloxicam.  Allergy Precautions: None required Coagulopathies: Reviewed. None identified.  Blood-thinner therapy: None at this time Active Infection(s): Reviewed. None identified. Ms. Nebergall is afebrile  Site Confirmation: Ms. Long was asked to confirm the procedure and laterality before marking the site Procedure checklist: Completed Consent: Before the procedure and under the influence of no sedative(s), amnesic(s), or anxiolytics, the patient was informed of the treatment options, risks and possible complications. To fulfill our ethical and legal obligations, as recommended by the American Medical Association's Code of Ethics, I have informed the patient of my clinical impression; the nature and purpose of the treatment or procedure; the risks, benefits, and possible complications of the intervention; the alternatives, including doing nothing; the risk(s)  and benefit(s) of the alternative treatment(s) or procedure(s); and the risk(s) and benefit(s) of doing nothing. The patient was provided information about the general risks and possible complications associated with the  procedure. These may include, but are not limited to: failure to achieve desired goals, infection, bleeding, organ or nerve damage, allergic reactions, paralysis, and death. In addition, the patient was informed of those risks and complications associated to Spine-related procedures, such as failure to decrease pain; infection (i.e.: Meningitis, epidural or intraspinal abscess); bleeding (i.e.: epidural hematoma, subarachnoid hemorrhage, or any other type of intraspinal or peri-dural bleeding); organ or nerve damage (i.e.: Any type of peripheral nerve, nerve root, or spinal cord injury) with subsequent damage to sensory, motor, and/or autonomic systems, resulting in permanent pain, numbness, and/or weakness of one or several areas of the body; allergic reactions; (i.e.: anaphylactic reaction); and/or death. Furthermore, the patient was informed of those risks and complications associated with the medications. These include, but are not limited to: allergic reactions (i.e.: anaphylactic or anaphylactoid reaction(s)); adrenal axis suppression; blood sugar elevation that in diabetics may result in ketoacidosis or comma; water retention that in patients with history of congestive heart failure may result in shortness of breath, pulmonary edema, and decompensation with resultant heart failure; weight gain; swelling or edema; medication-induced neural toxicity; particulate matter embolism and blood vessel occlusion with resultant organ, and/or nervous system infarction; and/or aseptic necrosis of one or more joints. Finally, the patient was informed that Medicine is not an exact science; therefore, there is also the possibility of unforeseen or unpredictable risks and/or possible complications that  may result in a catastrophic outcome. The patient indicated having understood very clearly. We have given the patient no guarantees and we have made no promises. Enough time was given to the patient to ask questions, all of which were answered to the patient's satisfaction. Ms. Pressey has indicated that she wanted to continue with the procedure. Attestation: I, the ordering provider, attest that I have discussed with the patient the benefits, risks, side-effects, alternatives, likelihood of achieving goals, and potential problems during recovery for the procedure that I have provided informed consent. Date  Time: 08/31/2021 11:20 AM  Pre-Procedure Preparation:  Monitoring: As per clinic protocol. Respiration, ETCO2, SpO2, BP, heart rate and rhythm monitor placed and checked for adequate function Safety Precautions: Patient was assessed for positional comfort and pressure points before starting the procedure. Time-out: I initiated and conducted the "Time-out" before starting the procedure, as per protocol. The patient was asked to participate by confirming the accuracy of the "Time Out" information. Verification of the correct person, site, and procedure were performed and confirmed by me, the nursing staff, and the patient. "Time-out" conducted as per Joint Commission's Universal Protocol (UP.01.01.01). Time:    Description of Procedure:          Target Area: The interlaminar space, initially targeting the lower laminar border of the superior vertebral body. Approach: Paramedial approach. Area Prepped: Entire Posterior Lumbar Region DuraPrep (Iodine Povacrylex [0.7% available iodine] and Isopropyl Alcohol, 74% w/w) Safety Precautions: Aspiration looking for blood return was conducted prior to all injections. At no point did we inject any substances, as a needle was being advanced. No attempts were made at seeking any paresthesias. Safe injection practices and needle disposal techniques used.  Medications properly checked for expiration dates. SDV (single dose vial) medications used. Description of the Procedure: Protocol guidelines were followed. The procedure needle was introduced through the skin, ipsilateral to the reported pain, and advanced to the target area. Bone was contacted and the needle walked caudad, until the lamina was cleared. The epidural space was identified using "loss-of-resistance technique" with 2-3 ml of PF-NaCl (0.9% NSS),  in a 5cc LOR glass syringe.  Vitals:   08/31/21 1119  BP: 115/74  Pulse: 87  Resp: 16  Temp: (!) 96.5 F (35.8 C)  TempSrc: Temporal  SpO2: 94%  Weight: 177 lb (80.3 kg)  Height: _0  (1.575 m)    Start Time:   hrs. End Time:   hrs.  Materials:  Needle(s) Type: Epidural needle Gauge: 17G Length: 3.5-in Medication(s): Please see orders for medications and dosing details.  Imaging Guidance (Spinal):          Type of Imaging Technique: Fluoroscopy Guidance (Spinal) Indication(s): Assistance in needle guidance and placement for procedures requiring needle placement in or near specific anatomical locations not easily accessible without such assistance. Exposure Time: Please see nurses notes. Contrast: Before injecting any contrast, we confirmed that the patient did not have an allergy to iodine, shellfish, or radiological contrast. Once satisfactory needle placement was completed at the desired level, radiological contrast was injected. Contrast injected under live fluoroscopy. No contrast complications. See chart for type and volume of contrast used. Fluoroscopic Guidance: I was personally present during the use of fluoroscopy. "Tunnel Vision Technique" used to obtain the best possible view of the target area. Parallax error corrected before commencing the procedure. "Direction-depth-direction" technique used to introduce the needle under continuous pulsed fluoroscopy. Once target was reached, antero-posterior, oblique, and lateral  fluoroscopic projection used confirm needle placement in all planes. Images permanently stored in EMR. Interpretation: I personally interpreted the imaging intraoperatively. Adequate needle placement confirmed in multiple planes. Appropriate spread of contrast into desired area was observed. No evidence of afferent or efferent intravascular uptake. No intrathecal or subarachnoid spread observed. Permanent images saved into the patient's record.  Antibiotic Prophylaxis:   Anti-infectives (From admission, onward)    None      Indication(s): None identified  Post-operative Assessment:  Post-procedure Vital Signs:  Pulse/HCG Rate: 87  Temp: (!) 96.5 F (35.8 C) Resp: 16 BP: 115/74 SpO2: 94 %  EBL: None  Complications: No immediate post-treatment complications observed by team, or reported by patient.  Note: The patient tolerated the entire procedure well. A repeat set of vitals were taken after the procedure and the patient was kept under observation following institutional policy, for this type of procedure. Post-procedural neurological assessment was performed, showing return to baseline, prior to discharge. The patient was provided with post-procedure discharge instructions, including a section on how to identify potential problems. Should any problems arise concerning this procedure, the patient was given instructions to immediately contact us, at any time, without hesitation. In any case, we plan to contact the patient by telephone for a follow-up status report regarding this interventional procedure.  Comments:  No additional relevant information.  Plan of Care  Orders:  Orders Placed This Encounter  Procedures   Lumbar Epidural Injection    Scheduling Instructions:     Procedure: Interlaminar LESI L2-3     Laterality: Left     Sedation: Patient's choice     Timeframe: Today    Order Specific Question:   Where will this procedure be performed?    Answer:   [facility]   DG PAIN [facility] C-ARM 1-60 MIN NO REPORT    Intraoperative interpretation by procedural physician at [place].    Standing Status:   Standing    Number of Occurrences:   1    Order Specific Question:   Reason for exam:    Answer:   Assistance in needle guidance and placement  for procedures requiring needle placement in or near specific anatomical locations not easily accessible without such assistance.   Informed Consent Details: Physician/Practitioner Attestation; Transcribe to consent form and obtain patient signature    Note: Always confirm laterality of pain with Ms. Alwyn Ren, before procedure. Transcribe to consent form and obtain patient signature.    Order Specific Question:   Physician/Practitioner attestation of informed consent for procedure/surgical case    Answer:   I, the physician/practitioner, attest that I have discussed with the patient the benefits, risks, side effects, alternatives, likelihood of achieving goals and potential problems during recovery for the procedure that I have provided informed consent.    Order Specific Question:   Procedure    Answer:   Lumbar epidural steroid injection under fluoroscopic guidance    Order Specific Question:   Physician/Practitioner performing the procedure    Answer:   Josie Burleigh A. Dossie Arbour, MD    Order Specific Question:   Indication/Reason    Answer:   Low back and/or lower extremity pain secondary to lumbar radiculitis   Provide equipment / supplies at bedside    "Epidural Tray" (Disposable  single use) Catheter: NOT required    Standing Status:   Standing    Number of Occurrences:   1    Order Specific Question:   Specify    Answer:   Epidural Tray    Chronic Opioid Analgesic:  Hydrocodone/APAP 5/325 one tablet every 8 hours (15 mg/day of hydrocodone) MME/day: 15 mg/day.   Medications ordered for procedure: Meds ordered this encounter  Medications   iohexol (OMNIPAQUE) 180 MG/ML injection 10 mL     Must be Myelogram-compatible. If not available, you may substitute with a water-soluble, non-ionic, hypoallergenic, myelogram-compatible radiological contrast medium.   lidocaine (XYLOCAINE) 2 % (with pres) injection 400 mg   pentafluoroprop-tetrafluoroeth (GEBAUERS) aerosol   sodium chloride flush (NS) 0.9 % injection 2 mL   ropivacaine (PF) 2 mg/mL (0.2%) (NAROPIN) injection 2 mL   triamcinolone acetonide (KENALOG-40) injection 40 mg    Medications administered: Theresa [name] had no medications administered during this visit.  See the medical record for exact dosing, route, and time of administration.  Follow-up plan:   Return in about 2 weeks (around 09/14/2021) for Proc-day (T,Th), (VV), (PPE).     Interventional Therapies  Risk  Complexity Considerations:   Estimated body mass index is 32.01 kg/m as calculated from the following:   Height as of this encounter: _0  (1.575 m).   Weight as of this encounter: 175 lb (79.4 kg). NOTE: NO further Lumbar RFA until BMI <35.   Planned  Pending:   Pending further evaluation   Under consideration:   Palliative left L2-3 LESI #2    Completed:   Palliative bilateral lumbar facet block x4 (02/12/2019)  Therapeutic left L2-3 LESI x1 (09/10/2019)  Diagnostic/palliative left L4 TFESI x1  Diagnostic/palliative left L4-5 LESI x2  Diagnostic/palliative right L4 TFESI x1  Diagnostic/palliative right L4-5 LESI x1  Palliative left lumbar facet RFA x1 (06/27/2017)  Palliative right lumbar facet RFA x1 (10/03/2017)    Therapeutic  Palliative (PRN) options:   Palliative bilateral lumbar facet block #4  Diagnostic/palliative left L4 TFESI #2  Diagnostic/palliative left L4-5 LESI #3  Diagnostic/palliative right L4 TFESI #2  Diagnostic/palliative right L4-5 LESI #2  Palliative left lumbar facet RFA #2  Palliative right lumbar facet RFA #2      Recent Visits Date Type Provider Dept  07/28/21 Office Visit Milinda Pointer, MD [facility]  Showing recent visits within past 90 days and meeting all other requirements Today's Visits Date Type Provider Dept  08/31/21 Procedure visit Milinda Pointer, MD [facility]  Showing today's visits and meeting all other requirements Future Appointments Date Type Provider Dept  10/27/21 Appointment Milinda Pointer, MD [facility]  Showing future appointments within next 90 days and meeting all other requirements Disposition: Discharge home  Discharge (Date  Time): 08/31/2021;   hrs.   Primary Care Physician: Pcp, No Location: [facility] Note by: Gaspar Cola, MD Date: 08/31/2021; Time: 11:29 AM  Disclaimer:  Medicine is not an Chief Strategy Officer. The only guarantee in medicine is that nothing is guaranteed. It is important to note that the decision to proceed with this intervention was based on the information collected from the patient. The Data and conclusions were drawn from the patient's questionnaire, the interview, and the physical examination. Because the information was provided in large part by the patient, it cannot be guaranteed that it has not been purposely or unconsciously manipulated. Every effort has been made to obtain as much relevant data as possible for this evaluation. It is important to note that the conclusions that lead to this procedure are derived in large part from the available data. Always take into account that the treatment will also be dependent on availability of resources and existing treatment guidelines, considered by other Pain Management Practitioners as being common knowledge and practice, at the time of the intervention. For Medico-Legal purposes, it is also important to point out that variation in procedural techniques and pharmacological choices are the acceptable norm. The indications, contraindications, technique, and results of the above procedure should only be interpreted and  judged by a Board-Certified Interventional Pain Specialist with extensive familiarity and expertise in the same exact procedure and technique.

## 2021-08-31 ENCOUNTER — Ambulatory Visit (HOSPITAL_BASED_OUTPATIENT_CLINIC_OR_DEPARTMENT_OTHER): Payer: Medicare HMO | Admitting: Pain Medicine

## 2021-08-31 ENCOUNTER — Ambulatory Visit
Admission: RE | Admit: 2021-08-31 | Discharge: 2021-08-31 | Disposition: A | Discharge status: Home / Self Care | Payer: Medicare HMO | Source: Ambulatory Visit | Attending: Pain Medicine | Admitting: Pain Medicine

## 2021-08-31 ENCOUNTER — Encounter: Payer: Self-pay | Admitting: Pain Medicine

## 2021-08-31 ENCOUNTER — Other Ambulatory Visit: Payer: Self-pay

## 2021-08-31 VITALS — BP 115/66 | HR 85 | Temp 96.5°F | Resp 21 | Ht 62.0 in | Wt 177.0 lb

## 2021-08-31 DIAGNOSIS — M5416 Radiculopathy, lumbar region: Secondary | ICD-10-CM | POA: Diagnosis not present

## 2021-08-31 DIAGNOSIS — G8929 Other chronic pain: Secondary | ICD-10-CM | POA: Diagnosis not present

## 2021-08-31 DIAGNOSIS — M5126 Other intervertebral disc displacement, lumbar region: Secondary | ICD-10-CM | POA: Insufficient documentation

## 2021-08-31 DIAGNOSIS — M5116 Intervertebral disc disorders with radiculopathy, lumbar region: Secondary | ICD-10-CM

## 2021-08-31 DIAGNOSIS — M5136 Other intervertebral disc degeneration, lumbar region: Secondary | ICD-10-CM | POA: Diagnosis not present

## 2021-08-31 DIAGNOSIS — M545 Low back pain, unspecified: Secondary | ICD-10-CM | POA: Insufficient documentation

## 2021-08-31 MED ORDER — LIDOCAINE HCL 2 % IJ SOLN
20.0000 mL | Freq: Once | INTRAMUSCULAR | Status: AC
  Administered 2021-08-31: 400 mg

## 2021-08-31 MED ORDER — TRIAMCINOLONE ACETONIDE 40 MG/ML IJ SUSP
INTRAMUSCULAR | Status: AC
  Filled 2021-08-31: qty 1

## 2021-08-31 MED ORDER — PENTAFLUOROPROP-TETRAFLUOROETH EX AERO
INHALATION_SPRAY | Freq: Once | CUTANEOUS | Status: DC
  Filled 2021-08-31: qty 116

## 2021-08-31 MED ORDER — ROPIVACAINE HCL 2 MG/ML IJ SOLN
INTRAMUSCULAR | Status: AC
  Filled 2021-08-31: qty 20

## 2021-08-31 MED ORDER — SODIUM CHLORIDE (PF) 0.9 % IJ SOLN
INTRAMUSCULAR | Status: AC
  Filled 2021-08-31: qty 10

## 2021-08-31 MED ORDER — LIDOCAINE HCL 2 % IJ SOLN
INTRAMUSCULAR | Status: AC
  Filled 2021-08-31: qty 10

## 2021-08-31 MED ORDER — TRIAMCINOLONE ACETONIDE 40 MG/ML IJ SUSP
40.0000 mg | Freq: Once | INTRAMUSCULAR | Status: AC
Start: 2021-08-31 — End: 2021-08-31
  Administered 2021-08-31: 40 mg

## 2021-08-31 MED ORDER — IOHEXOL 180 MG/ML  SOLN
10.0000 mL | Freq: Once | INTRAMUSCULAR | Status: AC
  Administered 2021-08-31: 10 mL via EPIDURAL

## 2021-08-31 MED ORDER — ROPIVACAINE HCL 2 MG/ML IJ SOLN
2.0000 mL | Freq: Once | INTRAMUSCULAR | Status: AC
  Administered 2021-08-31: 2 mL via EPIDURAL

## 2021-08-31 MED ORDER — SODIUM CHLORIDE 0.9% FLUSH
2.0000 mL | Freq: Once | INTRAVENOUS | Status: AC
  Administered 2021-08-31: 2 mL

## 2021-08-31 NOTE — Patient Instructions (Addendum)
____________________________________________________________________________________________  Virtual Visits   What is a "Virtual Visit"? It is a Metallurgist (medical visit) that takes place on real time (NOT TEXT or E-MAIL) over the telephone or computer device (desktop, laptop, tablet, smart phone, etc.). It allows for more location flexibility between the patient and the healthcare provider.  Who decides when these types of visits will be used? The physician.  Who is eligible for these types of visits? Only those patients that can be reliably reached over the telephone.  What do you mean by reliably? We do not have time to call everyone multiple times, therefore those that tend to screen calls and then call back later are not suitable candidates for this system. We understand how people are reluctant to pickup on "unknown" calls, therefore, we suggest adding our telephone numbers to your list of "CONTACT(s)". This way, you should be able to readily identify our calls when you receive one. All of our numbers are available below.   Who is not eligible? This option is not available for medication management encounters, specially for controlled substances. Patients on pain medications that fall under the category of controlled substances have to come in for "Face-to-Face" encounters. This is required for mandatory monitoring of these substances. You may be asked to provide a sample for an unannounced urine drug screening test (UDS), and we will need to count your pain pills. Not bringing your pills to be counted may result in no refill. Obviously, neither one of these can be done over the phone.  When will this type of visits be used? You can request a virtual visit whenever you are physically unable to attend a regular appointment. The decision will be made by the physician (or healthcare provider) on a case by case basis.   At what time will I be called? This is an  excellent question. The providers will try to call you whenever they have time available. Do not expect to be called at any specific time. The secretaries will assign you a time for your virtual visit appointment, but this is done simply to keep a list of those patients that need to be called, but not for the purpose of keeping a time schedule. Be advised that the call may come in anytime during the day, between the hours of 8:00 AM and 8::00 PM, depending on provider availability. We do understand that the system is not perfect. If you are unable to be available that day on a moments notice, then request an "in-person" appointment rather than a "virtual visit".  Can I request my medication visits to be "Virtual"? Yes you may request it, but the decision is entirely up to the healthcare provider. Control substances require specific monitoring that requires Face-to-Face encounters. The number of encounters  and the extent of the monitoring is determined on a case by case basis.  Add a new contact to your smart phone and label it "PAIN CLINIC" Under this contact add the following numbers: Main: [phone] (Official Contact Number) Nurses: [phone] (These are outgoing only calling systems. Do not call this number.) Dr. Dossie Arbour: [phone] or [phone] (Outgoing calls only. Do not call this number.)  ____________________________________________________________________________________________   ____________________________________________________________________________________________  Post-Procedure Discharge Instructions  Instructions: Apply ice:  Purpose: This will minimize any swelling and discomfort after procedure.  When: Day of procedure, as soon as you get home. How: Fill a plastic sandwich bag with crushed ice. Cover it with a small towel and apply to  injection site. How long: (15 min on, 15 min off) Apply for 15 minutes then remove x 15 minutes.  Repeat sequence on day  of procedure, until you go to bed. Apply heat:  Purpose: To treat any soreness and discomfort from the procedure. When: Starting the next day after the procedure. How: Apply heat to procedure site starting the day following the procedure. How long: May continue to repeat daily, until discomfort goes away. Food intake: Start with clear liquids (like water) and advance to regular food, as tolerated.  Physical activities: Keep activities to a minimum for the first 8 hours after the procedure. After that, then as tolerated. Driving: If you have received any sedation, be responsible and do not drive. You are not allowed to drive for 24 hours after having sedation. Blood thinner: (Applies only to those taking blood thinners) You may restart your blood thinner 6 hours after your procedure. Insulin: (Applies only to Diabetic patients taking insulin) As soon as you can eat, you may resume your normal dosing schedule. Infection prevention: Keep procedure site clean and dry. Shower daily and clean area with soap and water. Post-procedure Pain Diary: Extremely important that this be done correctly and accurately. Recorded information will be used to determine the next step in treatment. For the purpose of accuracy, follow these rules: Evaluate only the area treated. Do not report or include pain from an untreated area. For the purpose of this evaluation, ignore all other areas of pain, except for the treated area. After your procedure, avoid taking a long nap and attempting to complete the pain diary after you wake up. Instead, set your alarm clock to go off every hour, on the hour, for the initial 8 hours after the procedure. Document the duration of the numbing medicine, and the relief you are getting from it. Do not go to sleep and attempt to complete it later. It will not be accurate. If you received sedation, it is likely that you were given a medication that may cause amnesia. Because of this, completing  the diary at a later time may cause the information to be inaccurate. This information is needed to plan your care. Follow-up appointment: Keep your post-procedure follow-up evaluation appointment after the procedure (usually 2 weeks for most procedures, 6 weeks for radiofrequencies). DO NOT FORGET to bring you pain diary with you.   Expect: (What should I expect to see with my procedure?) From numbing medicine (AKA: Local Anesthetics): Numbness or decrease in pain. You may also experience some weakness, which if present, could last for the duration of the local anesthetic. Onset: Full effect within 15 minutes of injected. Duration: It will depend on the type of local anesthetic used. On the average, 1 to 8 hours.  From steroids (Applies only if steroids were used): Decrease in swelling or inflammation. Once inflammation is improved, relief of the pain will follow. Onset of benefits: Depends on the amount of swelling present. The more swelling, the longer it will take for the benefits to be seen. In some cases, up to 10 days. Duration: Steroids will stay in the system x 2 weeks. Duration of benefits will depend on multiple posibilities including persistent irritating factors. Side-effects: If present, they may typically last 2 weeks (the duration of the steroids). Frequent: Cramps (if they occur, drink Gatorade and take over-the-counter Magnesium 450-500 mg once to twice a day); water retention with temporary weight gain; increases in blood sugar; decreased immune system response; increased appetite. Occasional: Facial flushing (red,  warm cheeks); mood swings; menstrual changes. Uncommon: Long-term decrease or suppression of natural hormones; bone thinning. (These are more common with higher doses or more frequent use. This is why we prefer that our patients avoid having any injection therapies in other practices.)  Very Rare: Severe mood changes; psychosis; aseptic necrosis. From procedure: Some  discomfort is to be expected once the numbing medicine wears off. This should be minimal if ice and heat are applied as instructed.  Call if: (When should I call?) You experience numbness and weakness that gets worse with time, as opposed to wearing off. New onset bowel or bladder incontinence. (Applies only to procedures done in the spine)  Emergency Numbers: Durning business hours (Monday - Thursday, 8:00 AM - 4:00 PM) (Friday, 9:00 AM - 12:00 Noon): [phone] After hours: [phone] NOTE: If you are having a problem and are unable connect with, or to talk to a provider, then go to your nearest urgent care or emergency department. If the problem is serious and urgent, please call 911. ____________________________________________________________________________________________

## 2021-09-01 ENCOUNTER — Telehealth: Payer: Self-pay | Admitting: *Deleted

## 2021-09-01 DIAGNOSIS — E039 Hypothyroidism, unspecified: Secondary | ICD-10-CM | POA: Diagnosis not present

## 2021-09-01 DIAGNOSIS — J9621 Acute and chronic respiratory failure with hypoxia: Secondary | ICD-10-CM | POA: Diagnosis not present

## 2021-09-01 DIAGNOSIS — I272 Pulmonary hypertension, unspecified: Secondary | ICD-10-CM | POA: Diagnosis not present

## 2021-09-01 DIAGNOSIS — I110 Hypertensive heart disease with heart failure: Secondary | ICD-10-CM | POA: Diagnosis not present

## 2021-09-01 DIAGNOSIS — F32A Depression, unspecified: Secondary | ICD-10-CM | POA: Diagnosis not present

## 2021-09-01 DIAGNOSIS — J441 Chronic obstructive pulmonary disease with (acute) exacerbation: Secondary | ICD-10-CM | POA: Diagnosis not present

## 2021-09-01 DIAGNOSIS — F411 Generalized anxiety disorder: Secondary | ICD-10-CM | POA: Diagnosis not present

## 2021-09-01 DIAGNOSIS — I5032 Chronic diastolic (congestive) heart failure: Secondary | ICD-10-CM | POA: Diagnosis not present

## 2021-09-01 DIAGNOSIS — G4730 Sleep apnea, unspecified: Secondary | ICD-10-CM | POA: Diagnosis not present

## 2021-09-01 NOTE — Telephone Encounter (Signed)
Post procedure call.  Dena talking to patient right now.

## 2021-09-01 NOTE — Telephone Encounter (Signed)
Attempted to call for post procedure follow-up. Message left. 

## 2021-09-03 ENCOUNTER — Other Ambulatory Visit: Payer: Self-pay

## 2021-09-03 ENCOUNTER — Ambulatory Visit (INDEPENDENT_AMBULATORY_CARE_PROVIDER_SITE_OTHER): Payer: Medicare HMO | Admitting: Family Medicine

## 2021-09-03 DIAGNOSIS — Z23 Encounter for immunization: Secondary | ICD-10-CM | POA: Diagnosis not present

## 2021-09-03 NOTE — Progress Notes (Signed)
Vaccine only, no MD visit.  

## 2021-09-06 ENCOUNTER — Ambulatory Visit: Payer: Medicare HMO

## 2021-09-06 DIAGNOSIS — I272 Pulmonary hypertension, unspecified: Secondary | ICD-10-CM | POA: Diagnosis not present

## 2021-09-06 DIAGNOSIS — E039 Hypothyroidism, unspecified: Secondary | ICD-10-CM | POA: Diagnosis not present

## 2021-09-06 DIAGNOSIS — G4730 Sleep apnea, unspecified: Secondary | ICD-10-CM | POA: Diagnosis not present

## 2021-09-06 DIAGNOSIS — F32A Depression, unspecified: Secondary | ICD-10-CM | POA: Diagnosis not present

## 2021-09-06 DIAGNOSIS — I5032 Chronic diastolic (congestive) heart failure: Secondary | ICD-10-CM | POA: Diagnosis not present

## 2021-09-06 DIAGNOSIS — J9621 Acute and chronic respiratory failure with hypoxia: Secondary | ICD-10-CM | POA: Diagnosis not present

## 2021-09-06 DIAGNOSIS — I110 Hypertensive heart disease with heart failure: Secondary | ICD-10-CM | POA: Diagnosis not present

## 2021-09-06 DIAGNOSIS — J441 Chronic obstructive pulmonary disease with (acute) exacerbation: Secondary | ICD-10-CM

## 2021-09-06 DIAGNOSIS — F411 Generalized anxiety disorder: Secondary | ICD-10-CM | POA: Diagnosis not present

## 2021-09-07 DIAGNOSIS — J441 Chronic obstructive pulmonary disease with (acute) exacerbation: Secondary | ICD-10-CM | POA: Diagnosis not present

## 2021-09-07 DIAGNOSIS — G4730 Sleep apnea, unspecified: Secondary | ICD-10-CM | POA: Diagnosis not present

## 2021-09-07 DIAGNOSIS — F32A Depression, unspecified: Secondary | ICD-10-CM | POA: Diagnosis not present

## 2021-09-07 DIAGNOSIS — E039 Hypothyroidism, unspecified: Secondary | ICD-10-CM | POA: Diagnosis not present

## 2021-09-07 DIAGNOSIS — F411 Generalized anxiety disorder: Secondary | ICD-10-CM | POA: Diagnosis not present

## 2021-09-07 DIAGNOSIS — I110 Hypertensive heart disease with heart failure: Secondary | ICD-10-CM | POA: Diagnosis not present

## 2021-09-07 DIAGNOSIS — I272 Pulmonary hypertension, unspecified: Secondary | ICD-10-CM | POA: Diagnosis not present

## 2021-09-07 DIAGNOSIS — I5032 Chronic diastolic (congestive) heart failure: Secondary | ICD-10-CM | POA: Diagnosis not present

## 2021-09-07 DIAGNOSIS — J9621 Acute and chronic respiratory failure with hypoxia: Secondary | ICD-10-CM | POA: Diagnosis not present

## 2021-09-08 DIAGNOSIS — J449 Chronic obstructive pulmonary disease, unspecified: Secondary | ICD-10-CM | POA: Diagnosis not present

## 2021-09-12 DIAGNOSIS — F32A Depression, unspecified: Secondary | ICD-10-CM | POA: Diagnosis not present

## 2021-09-12 DIAGNOSIS — J9621 Acute and chronic respiratory failure with hypoxia: Secondary | ICD-10-CM | POA: Diagnosis not present

## 2021-09-12 DIAGNOSIS — I5032 Chronic diastolic (congestive) heart failure: Secondary | ICD-10-CM | POA: Diagnosis not present

## 2021-09-12 DIAGNOSIS — J441 Chronic obstructive pulmonary disease with (acute) exacerbation: Secondary | ICD-10-CM | POA: Diagnosis not present

## 2021-09-12 DIAGNOSIS — I272 Pulmonary hypertension, unspecified: Secondary | ICD-10-CM | POA: Diagnosis not present

## 2021-09-12 DIAGNOSIS — I110 Hypertensive heart disease with heart failure: Secondary | ICD-10-CM | POA: Diagnosis not present

## 2021-09-12 DIAGNOSIS — E039 Hypothyroidism, unspecified: Secondary | ICD-10-CM | POA: Diagnosis not present

## 2021-09-12 DIAGNOSIS — G4730 Sleep apnea, unspecified: Secondary | ICD-10-CM | POA: Diagnosis not present

## 2021-09-12 DIAGNOSIS — F411 Generalized anxiety disorder: Secondary | ICD-10-CM | POA: Diagnosis not present

## 2021-09-13 ENCOUNTER — Encounter: Payer: Self-pay | Admitting: Pain Medicine

## 2021-09-13 NOTE — Chronic Care Management (AMB) (Signed)
  Chronic Care Management   CCM RN Visit Note   Name: Theresa [name] MRN: [id number] DOB: Jun 12, 1956  Subjective: Theresa [name] is a 65 y.o. year old female. The care management team was consulted for assistance with disease management and care coordination needs.    Engaged with patient by telephone for initial visit in response to provider referral for case management and care coordination services.   Theresa [name] was unable to complete the outreach as scheduled today due to issues with the telephone connection. Will follow up when she is available. Anticipate completing outreach within the next two weeks.   Theresa [name] [phone]

## 2021-09-14 ENCOUNTER — Other Ambulatory Visit: Payer: Self-pay

## 2021-09-14 ENCOUNTER — Ambulatory Visit: Payer: Medicare HMO | Attending: Pain Medicine | Admitting: Pain Medicine

## 2021-09-14 DIAGNOSIS — M79605 Pain in left leg: Secondary | ICD-10-CM | POA: Diagnosis not present

## 2021-09-14 DIAGNOSIS — M5416 Radiculopathy, lumbar region: Secondary | ICD-10-CM | POA: Diagnosis not present

## 2021-09-14 DIAGNOSIS — M47816 Spondylosis without myelopathy or radiculopathy, lumbar region: Secondary | ICD-10-CM | POA: Diagnosis not present

## 2021-09-14 DIAGNOSIS — M25551 Pain in right hip: Secondary | ICD-10-CM | POA: Diagnosis not present

## 2021-09-14 DIAGNOSIS — G8929 Other chronic pain: Secondary | ICD-10-CM

## 2021-09-14 DIAGNOSIS — M25562 Pain in left knee: Secondary | ICD-10-CM | POA: Diagnosis not present

## 2021-09-14 DIAGNOSIS — M5126 Other intervertebral disc displacement, lumbar region: Secondary | ICD-10-CM | POA: Diagnosis not present

## 2021-09-14 DIAGNOSIS — M51369 Other intervertebral disc degeneration, lumbar region without mention of lumbar back pain or lower extremity pain: Secondary | ICD-10-CM

## 2021-09-14 DIAGNOSIS — M25552 Pain in left hip: Secondary | ICD-10-CM

## 2021-09-14 DIAGNOSIS — M545 Low back pain, unspecified: Secondary | ICD-10-CM

## 2021-09-14 DIAGNOSIS — M5136 Other intervertebral disc degeneration, lumbar region: Secondary | ICD-10-CM | POA: Diagnosis not present

## 2021-09-14 DIAGNOSIS — M5116 Intervertebral disc disorders with radiculopathy, lumbar region: Secondary | ICD-10-CM | POA: Diagnosis not present

## 2021-09-14 NOTE — Progress Notes (Signed)
Patient: Theresa [name]  Service Category: E/M  Provider: Gaspar Cola, MD  DOB: 16-Feb-1956  DOS: 09/14/2021  Location: Office  MRN: [id number]  Setting: Ambulatory outpatient  Referring Provider: No ref. provider found  Type: Established Patient  Specialty: Interventional Pain Management  PCP: Pcp, No  Location: Remote location  Delivery: TeleHealth     Virtual Encounter - Pain Management PROVIDER NOTE: Information contained herein reflects review and annotations entered in association with encounter. Interpretation of such information and data should be left to medically-trained personnel. Information provided to patient can be located elsewhere in the medical record under "Patient Instructions". Document created using STT-dictation technology, any transcriptional errors that may result from process are unintentional.    Contact & Pharmacy Preferred: [phone] Home: [phone] (home) Mobile: [phone] (mobile) E-mail: [email or web address]  [place], [place] [place] [address] Phone: [phone] Fax: [phone] [address], [address] [address] [place] [address] Phone: [phone] Fax: [phone]   Pre-screening  Ms. Wilborn offered "in-person" vs "virtual" encounter. She indicated preferring virtual for this encounter.   Reason COVID-19*  Social distancing based on CDC and AMA recommendations.   I contacted [name] on 09/14/2021 via telephone.      I clearly identified myself as Gaspar Cola, MD. I verified that I was speaking with the correct person using two identifiers (Name: Clotilda Hafer, and date of birth: [birth year]).  Consent I sought verbal advanced consent from [name] for virtual visit interactions. I informed Ms. Feng of possible security and privacy concerns, risks, and limitations associated with  providing "not-in-person" medical evaluation and management services. I also informed Ms. Bartl of the availability of "in-person" appointments. Finally, I informed her that there would be a charge for the virtual visit and that she could be  personally, fully or partially, financially responsible for it. Ms. Paone expressed understanding and agreed to proceed.   Historic Elements   Ms. Linnell Swords is a 65 y.o. year old, female patient evaluated today after our last contact on 08/31/2021. Ms. Batterman  has a past medical history of Acute postoperative pain (10/03/2017), Anxiety, BiPAP (biphasic positive airway pressure) dependence, Carpal tunnel syndrome, CHF (congestive heart failure) ([place]), CHF (congestive heart failure) ([place]), Chronic generalized abdominal pain, Chronic rhinitis, COPD (chronic obstructive pulmonary disease) ([place]), DDD (degenerative disc disease), cervical, DDD (degenerative disc disease), lumbosacral, Depression, Dyspnea, Edema, Flu, Gastritis, GERD (gastroesophageal reflux disease), Hematuria, Hernia of abdominal wall (07/17/2018), Hernia, abdominal, Hypertension, Kidney stones, Low back pain, Lumbar radiculitis, Malodorous urine, Muscle weakness, Obesity, Oxygen dependent, Renal cyst, Sensory urge incontinence, Thyroid activity decreased, Tobacco abuse, and Wheezing. She also  has a past surgical history that includes Abdominal hysterectomy; Tonsillectomy; Carpal tunnel release (Left, 2012); Tubal ligation; epigastric hernia repair (N/A, 08/13/2018); Umbilical hernia repair (N/A, 08/13/2018); and RIGHT/LEFT HEART CATH AND CORONARY ANGIOGRAPHY (N/A, 07/11/2019). Ms. Yaw has a current medication list which includes the following prescription(s): albuterol, aspirin ec, cholecalciferol, fluticasone-umeclidin-vilant, ipratropium-albuterol, levothyroxine, oxygen-helium, potassium chloride sa, daliresp, sertraline, simvastatin, and torsemide. She  reports that she has been smoking  cigarettes. She has a 22.00 pack-year smoking history. She has never used smokeless tobacco. She reports that she does not drink alcohol and does not use drugs. Ms. Knouff is allergic to codeine and meloxicam.   HPI  Today, she is being contacted for a post-procedure assessment.  The patient indicates having an ongoing 50% relief of the pain from the L2-3 LESI.  This was the first epidural done since 09/10/2019.  Since the patient is still having some pain on prolonged standing, I have offered her a repeat injection and I also let her know that we could do up to 3 of these in a 61-monthperiod.  She is interested in repeating the injection.  Post-Procedure Evaluation  Procedure (08/31/2021):    Procedure:          Anesthesia, Analgesia, Anxiolysis:  Type: Therapeutic Inter-Laminar Epidural Steroid Injection           Region: Lumbar Level: L2-3 Level. Laterality: Left Paramedial  Anesthesia: Local (1-2% Lidocaine)  Anxiolysis: None  Sedation: None  Guidance: Fluoroscopy           Position: Prone with head of the table was raised to facilitate breathing.   Indications: 1. Chronic left-sided lumbar radiculopathy   2. Chronic low back pain (1ry area of Pain) (Bilateral) (L>R) w/o sciatica   3. DDD (degenerative disc disease), lumbar   4. 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 Neuritis or radiculitis due to rupture of lumbar intervertebral disc    Pain Score: Pre-procedure: 6 /10 Post-procedure: 6 /10      Anxiolysis: Please see nurses note.  Effectiveness during initial hour after procedure (Ultra-Short Term Relief): 100 %.  Local anesthetic used: Long-acting (4-6 hours) Effectiveness: Defined as any analgesic benefit obtained secondary to the administration of local anesthetics. This carries significant diagnostic value as to the etiological location, or anatomical origin, of the pain. Duration of benefit is expected to coincide with the duration of the local anesthetic used.   Effectiveness during initial 4-6 hours after procedure (Short-Term Relief): 100 %.  Long-term benefit: Defined as any relief past the pharmacologic duration of the local anesthetics.  Effectiveness past the initial 6 hours after procedure (Long-Term Relief): 50 % (current date).  Benefits, current: Defined as benefit present at the time of this evaluation.   Analgesia: The patient indicates currently having a 50% ongoing relief of the pain.  She refers no longer having any stabbing or twitching pain but she still having problems with prolonged standing. Function: Ms. SDalportoreports improvement in function ROM: Ms. SLongwellreports improvement in ROM  Pharmacotherapy Assessment   Analgesic: Hydrocodone/APAP 5/325 one tablet every 8 hours (15 mg/day of hydrocodone) MME/day: 15 mg/day.   Monitoring:  PMP: PDMP reviewed during this encounter.       Pharmacotherapy: No side-effects or adverse reactions reported. Compliance: No problems identified. Effectiveness: Clinically acceptable. Plan: Refer to "POC". UDS:  Summary  Date Value Ref Range Status  05/05/2021 Note  Final    Comment:    ==================================================================== ToxASSURE Select 13 (MW) ==================================================================== Specimen Alert Note: Urinary creatinine is low; ability to detect some drugs may be compromised. Interpret results with caution. (Creatinine) ==================================================================== Test                             Result       Flag       Units  Drug Present and Declared for Prescription Verification   Hydrocodone                    1236         EXPECTED   ng/mg creat   Norhydrocodone  986          EXPECTED   ng/mg creat    Sources of hydrocodone include scheduled prescription medications.    Norhydrocodone is an expected metabolite of hydrocodone.  Drug Absent but Declared for Prescription  Verification   Diazepam                       Not Detected UNEXPECTED ng/mg creat ==================================================================== Test                      Result    Flag   Units      Ref Range   Creatinine              14        LL     mg/dL      >=20 ==================================================================== Declared Medications:  The flagging and interpretation on this report are based on the  following declared medications.  Unexpected results may arise from  inaccuracies in the declared medications.   **Note: The testing scope of this panel includes these medications:   Diazepam (Valium)  Hydrocodone (Norco)   **Note: The testing scope of this panel does not include the  following reported medications:   Acetaminophen (Norco)  Albuterol (Ventolin HFA)  Aspirin  Baclofen (Lioresal)  Cholecalciferol  Digoxin (Lanoxin)  Fluticasone  Gabapentin (Neurontin)  Umeclidinium  Vilanterol ==================================================================== For clinical consultation, please call [phone]. ====================================================================      Laboratory Chemistry Profile   Renal Lab Results  Component Value Date   BUN 12 08/20/2021   CREATININE 0.76 08/20/2021   BCR 16 08/20/2021   GFRAA >60 06/26/2020   GFRNONAA >60 08/12/2021    Hepatic Lab Results  Component Value Date   AST 26 06/10/2021   ALT 26 06/10/2021   ALBUMIN 4.3 08/20/2021   ALKPHOS 115 06/10/2021   LIPASE 31 10/17/2017   AMMONIA 40 (H) 10/11/2019    Electrolytes Lab Results  Component Value Date   NA 146 (H) 08/20/2021   K 4.2 08/20/2021   CL 101 08/20/2021   CALCIUM 9.5 08/20/2021   MG 2.4 (H) 08/20/2021   PHOS 2.9 (L) 08/20/2021    Bone Lab Results  Component Value Date   25OHVITD1 13 (L) 09/17/2015   25OHVITD2 <1.0 09/17/2015   25OHVITD3 13 09/17/2015    Inflammation (CRP: Acute Phase) (ESR: Chronic Phase) Lab  Results  Component Value Date   CRP 4.2 (H) 02/08/2020   ESRSEDRATE 16 08/08/2017   LATICACIDVEN 1.4 02/03/2020         Note: Above Lab results reviewed.  Imaging  DG PAIN CLINIC C-ARM 1-60 MIN NO REPORT Fluoro was used, but no Radiologist interpretation will be provided.  Please refer to "NOTES" tab for provider progress note.  Assessment  The primary encounter diagnosis was Chronic left-sided lumbar radiculopathy. 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  Plan of Care  Problem-specific:  No problem-specific Assessment & Plan notes found for this encounter.  Ms. Dulcemaria Bula has a current medication list which includes the following long-term medication(s): albuterol, ipratropium-albuterol, levothyroxine, potassium chloride sa, sertraline, simvastatin,  and torsemide.  Pharmacotherapy (Medications Ordered): No orders of the defined types were placed in this encounter.  Orders:  Orders Placed This Encounter  Procedures   Lumbar Epidural Injection    Standing Status:   Future    Standing Expiration Date:   12/14/2021    Scheduling Instructions:     Procedure: Interlaminar Lumbar Epidural Steroid injection (LESI)  L2-3     Laterality: Left-sided     Sedation: Patient's choice.     Timeframe: ASAA    Order Specific Question:   Where will this procedure be performed?    Answer:   [facility]    Follow-up plan:   Return for (Clinic) procedure: (L) L2-3 LESI #2, (Sed-anx).     Interventional Therapies  Risk  Complexity Considerations:   Estimated body mass index is  32.01 kg/m as calculated from the following:   Height as of this encounter: _0  (1.575 m).   Weight as of this encounter: 175 lb (79.4 kg). NOTE: NO further Lumbar RFA until BMI <35.   Planned  Pending:   Pending further evaluation   Under consideration:   Palliative left L2-3 LESI #2    Completed:   Palliative bilateral lumbar facet block x4 (02/12/2019)  Therapeutic left L2-3 LESI x1 (09/10/2019)  Diagnostic/palliative left L4 TFESI x1  Diagnostic/palliative left L4-5 LESI x2  Diagnostic/palliative right L4 TFESI x1  Diagnostic/palliative right L4-5 LESI x1  Palliative left lumbar facet RFA x1 (06/27/2017)  Palliative right lumbar facet RFA x1 (10/03/2017)    Therapeutic  Palliative (PRN) options:   Palliative bilateral lumbar facet block #4  Diagnostic/palliative left L4 TFESI #2  Diagnostic/palliative left L4-5 LESI #3  Diagnostic/palliative right L4 TFESI #2  Diagnostic/palliative right L4-5 LESI #2  Palliative left lumbar facet RFA #2  Palliative right lumbar facet RFA #2      Recent Visits Date Type Provider Dept  08/31/21 Procedure visit Milinda Pointer, MD [facility]  07/28/21 Office Visit Milinda Pointer, MD [facility]  Showing recent visits within past 90 days and meeting all other requirements Today's Visits Date Type Provider Dept  09/14/21 Office Visit Milinda Pointer, MD [facility]  Showing today's visits and meeting all other requirements Future Appointments Date Type Provider Dept  10/27/21 Appointment Milinda Pointer, MD [facility]  Showing future appointments within next 90 days and meeting all other requirements I discussed the assessment and treatment plan with the patient. The patient was provided an opportunity to ask questions and all were answered. The patient agreed with the plan and demonstrated an understanding of the instructions.  Patient advised to call back or seek an in-person  evaluation if the symptoms or condition worsens.  Duration of encounter: 15 minutes.  Note by: Gaspar Cola, MD Date: 09/14/2021; Time: 4:34 PM

## 2021-09-15 DIAGNOSIS — I272 Pulmonary hypertension, unspecified: Secondary | ICD-10-CM | POA: Diagnosis not present

## 2021-09-15 DIAGNOSIS — E039 Hypothyroidism, unspecified: Secondary | ICD-10-CM | POA: Diagnosis not present

## 2021-09-15 DIAGNOSIS — J9621 Acute and chronic respiratory failure with hypoxia: Secondary | ICD-10-CM | POA: Diagnosis not present

## 2021-09-15 DIAGNOSIS — I110 Hypertensive heart disease with heart failure: Secondary | ICD-10-CM | POA: Diagnosis not present

## 2021-09-15 DIAGNOSIS — I5032 Chronic diastolic (congestive) heart failure: Secondary | ICD-10-CM | POA: Diagnosis not present

## 2021-09-15 DIAGNOSIS — F32A Depression, unspecified: Secondary | ICD-10-CM | POA: Diagnosis not present

## 2021-09-15 DIAGNOSIS — F411 Generalized anxiety disorder: Secondary | ICD-10-CM | POA: Diagnosis not present

## 2021-09-15 DIAGNOSIS — G4730 Sleep apnea, unspecified: Secondary | ICD-10-CM | POA: Diagnosis not present

## 2021-09-15 DIAGNOSIS — J441 Chronic obstructive pulmonary disease with (acute) exacerbation: Secondary | ICD-10-CM | POA: Diagnosis not present

## 2021-09-15 NOTE — Patient Instructions (Signed)
______________________________________________________________________  Preparing for Procedure with Sedation  NOTICE: Due to recent regulatory changes, starting on May 17, 2021, procedures requiring intravenous (IV) sedation will no longer be performed at the [facility].  These types of procedures are required to be performed at [facility] ambulatory surgery facility.  We are very sorry for the inconvenience.  Procedure appointments are limited to planned procedures: No Prescription Refills. No disability issues will be discussed. No medication changes will be discussed.  Instructions: Oral Intake: Do not eat or drink anything for at least 8 hours prior to your procedure. (Exception: Blood Pressure Medication. See below.) Transportation: A driver is required. You may not drive yourself after the procedure. Blood Pressure Medicine: Do not forget to take your blood pressure medicine with a sip of water the morning of the procedure. If your Diastolic (lower reading) is above 100 mmHg, elective cases will be cancelled/rescheduled. Blood thinners: These will need to be stopped for procedures. Notify our staff if you are taking any blood thinners. Depending on which one you take, there will be specific instructions on how and when to stop it. Diabetics on insulin: Notify the staff so that you can be scheduled 1st case in the morning. If your diabetes requires high dose insulin, take only  of your normal insulin dose the morning of the procedure and notify the staff that you have done so. Preventing infections: Shower with an antibacterial soap the morning of your procedure. Build-up your immune system: Take 1000 mg of Vitamin C with every meal (3 times a day) the day prior to your procedure. Antibiotics: Inform the staff if you have a condition or reason that requires you to take antibiotics before dental procedures. Pregnancy: If you are pregnant, call and cancel the procedure. Sickness: If  you have a cold, fever, or any active infections, call and cancel the procedure. Arrival: You must be in the facility at least 30 minutes prior to your scheduled procedure. Children: Do not bring children with you. Dress appropriately: Bring dark clothing that you would not mind if they get stained. Valuables: Do not bring any jewelry or valuables.  Reasons to call and reschedule or cancel your procedure: (Following these recommendations will minimize the risk of a serious complication.) Surgeries: Avoid having procedures within 2 weeks of any surgery. (Avoid for 2 weeks before or after any surgery). Flu Shots: Avoid having procedures within 2 weeks of a flu shots. (Avoid for 2 weeks before or after immunizations). Barium: Avoid having a procedure within 7-10 days after having had a radiological study involving the use of radiological contrast. (Myelograms, Barium swallow or enema study). Heart attacks: Avoid any elective procedures or surgeries for the initial 6 months after a "Myocardial Infarction" (Heart Attack). Blood thinners: It is imperative that you stop these medications before procedures. Let us know if you if you take any blood thinner.  Infection: Avoid procedures during or within two weeks of an infection (including chest colds or gastrointestinal problems). Symptoms associated with infections include: Localized redness, fever, chills, night sweats or profuse sweating, burning sensation when voiding, cough, congestion, stuffiness, runny nose, sore throat, diarrhea, nausea, vomiting, cold or Flu symptoms, recent or current infections. It is specially important if the infection is over the area that we intend to treat. Heart and lung problems: Symptoms that may suggest an active cardiopulmonary problem include: cough, chest pain, breathing difficulties or shortness of breath, dizziness, ankle swelling, uncontrolled high or unusually low blood pressure, and/or palpitations. If you are  experiencing any of these symptoms, cancel your procedure and contact your primary care physician for an evaluation.  Remember:  Regular Business hours are:  Monday to Thursday 8:00 AM to 4:00 PM  Provider's Schedule: Milinda Pointer, MD:  Procedure days: Tuesday and Thursday 7:30 AM to 4:00 PM  Gillis Santa, MD:  Procedure days: Monday and Wednesday 7:30 AM to 4:00 PM ______________________________________________________________________

## 2021-09-20 ENCOUNTER — Ambulatory Visit (INDEPENDENT_AMBULATORY_CARE_PROVIDER_SITE_OTHER): Payer: Medicare HMO

## 2021-09-20 DIAGNOSIS — Z0000 Encounter for general adult medical examination without abnormal findings: Secondary | ICD-10-CM

## 2021-09-20 NOTE — Patient Instructions (Signed)
Theresa [name] , Thank you for taking time to come for your Medicare Wellness Visit. I appreciate your ongoing commitment to your health goals. Please review the following plan we discussed and let me know if I can assist you in the future.   Screening recommendations/referrals: Colonoscopy: 07/05/17 Mammogram: 11/18/20 Bone Density: 05/23/17 Recommended yearly ophthalmology/optometry visit for glaucoma screening and checkup Recommended yearly dental visit for hygiene and checkup  Vaccinations: Influenza vaccine: 09/03/21 Pneumococcal vaccine: 11/03/16 Tdap vaccine: n/d Shingles vaccine: n/d    Covid-19: one [employer] vaccine  Advanced directives: no, requested info, will mail  Conditions/risks identified:   Next appointment: Follow up in one year for your annual wellness visit.   Preventive Care 40-64 Years, Female Preventive care refers to lifestyle choices and visits with your health care provider that can promote health and wellness. What does preventive care include? A yearly physical exam. This is also called an annual well check. Dental exams once or twice a year. Routine eye exams. Ask your health care provider how often you should have your eyes checked. Personal lifestyle choices, including: Daily care of your teeth and gums. Regular physical activity. Eating a healthy diet. Avoiding tobacco and drug use. Limiting alcohol use. Practicing safe sex. Taking low-dose aspirin daily starting at age 38. Taking vitamin and mineral supplements as recommended by your health care provider. What happens during an annual well check? The services and screenings done by your health care provider during your annual well check will depend on your age, overall health, lifestyle risk factors, and family history of disease. Counseling  Your health care provider may ask you questions about your: Alcohol use. Tobacco use. Drug use. Emotional well-being. Home and relationship  well-being. Sexual activity. Eating habits. Work and work Statistician. Method of birth control. Menstrual cycle. Pregnancy history. Screening  You may have the following tests or measurements: Height, weight, and BMI. Blood pressure. Lipid and cholesterol levels. These may be checked every 5 years, or more frequently if you are over 36 years old. Skin check. Lung cancer screening. You may have this screening every year starting at age 25 if you have a 30-pack-year history of smoking and currently smoke or have quit within the past 15 years. Fecal occult blood test (FOBT) of the stool. You may have this test every year starting at age 38. Flexible sigmoidoscopy or colonoscopy. You may have a sigmoidoscopy every 5 years or a colonoscopy every 10 years starting at age 104. Hepatitis C blood test. Hepatitis B blood test. Sexually transmitted disease (STD) testing. Diabetes screening. This is done by checking your blood sugar (glucose) after you have not eaten for a while (fasting). You may have this done every 1-3 years. Mammogram. This may be done every 1-2 years. Talk to your health care provider about when you should start having regular mammograms. This may depend on whether you have a family history of breast cancer. BRCA-related cancer screening. This may be done if you have a family history of breast, ovarian, tubal, or peritoneal cancers. Pelvic exam and Pap test. This may be done every 3 years starting at age 86. Starting at age 43, this may be done every 5 years if you have a Pap test in combination with an HPV test. Bone density scan. This is done to screen for osteoporosis. You may have this scan if you are at high risk for osteoporosis. Discuss your test results, treatment options, and if necessary, the need for more tests with your health  care provider. Vaccines  Your health care provider may recommend certain vaccines, such as: Influenza vaccine. This is recommended every  year. Tetanus, diphtheria, and acellular pertussis (Tdap, Td) vaccine. You may need a Td booster every 10 years. Zoster vaccine. You may need this after age 39. Pneumococcal 13-valent conjugate (PCV13) vaccine. You may need this if you have certain conditions and were not previously vaccinated. Pneumococcal polysaccharide (PPSV23) vaccine. You may need one or two doses if you smoke cigarettes or if you have certain conditions. Talk to your health care provider about which screenings and vaccines you need and how often you need them. This information is not intended to replace advice given to you by your health care provider. Make sure you discuss any questions you have with your health care provider. Document Released: 10/30/2015 Document Revised: 06/22/2016 Document Reviewed: 08/04/2015 Elsevier Interactive Patient Education  2017 [employer] Prevention in the Home Falls can cause injuries. They can happen to people of all ages. There are many things you can do to make your home safe and to help prevent falls. What can I do on the outside of my home? Regularly fix the edges of walkways and driveways and fix any cracks. Remove anything that might make you trip as you walk through a door, such as a raised step or threshold. Trim any bushes or trees on the path to your home. Use bright outdoor lighting. Clear any walking paths of anything that might make someone trip, such as rocks or tools. Regularly check to see if handrails are loose or broken. Make sure that both sides of any steps have handrails. Any raised decks and porches should have guardrails on the edges. Have any leaves, snow, or ice cleared regularly. Use sand or salt on walking paths during winter. Clean up any spills in your garage right away. This includes oil or grease spills. What can I do in the bathroom? Use night lights. Install grab bars by the toilet and in the tub and shower. Do not use towel bars as grab  bars. Use non-skid mats or decals in the tub or shower. If you need to sit down in the shower, use a plastic, non-slip stool. Keep the floor dry. Clean up any water that spills on the floor as soon as it happens. Remove soap buildup in the tub or shower regularly. Attach bath mats securely with double-sided non-slip rug tape. Do not have throw rugs and other things on the floor that can make you trip. What can I do in the bedroom? Use night lights. Make sure that you have a light by your bed that is easy to reach. Do not use any sheets or blankets that are too big for your bed. They should not hang down onto the floor. Have a firm chair that has side arms. You can use this for support while you get dressed. Do not have throw rugs and other things on the floor that can make you trip. What can I do in the kitchen? Clean up any spills right away. Avoid walking on wet floors. Keep items that you use a lot in easy-to-reach places. If you need to reach something above you, use a strong step stool that has a grab bar. Keep electrical cords out of the way. Do not use floor polish or wax that makes floors slippery. If you must use wax, use non-skid floor wax. Do not have throw rugs and other things on the floor that can  make you trip. What can I do with my stairs? Do not leave any items on the stairs. Make sure that there are handrails on both sides of the stairs and use them. Fix handrails that are broken or loose. Make sure that handrails are as long as the stairways. Check any carpeting to make sure that it is firmly attached to the stairs. Fix any carpet that is loose or worn. Avoid having throw rugs at the top or bottom of the stairs. If you do have throw rugs, attach them to the floor with carpet tape. Make sure that you have a light switch at the top of the stairs and the bottom of the stairs. If you do not have them, ask someone to add them for you. What else can I do to help prevent  falls? Wear shoes that: Do not have high heels. Have rubber bottoms. Are comfortable and fit you well. Are closed at the toe. Do not wear sandals. If you use a stepladder: Make sure that it is fully opened. Do not climb a closed stepladder. Make sure that both sides of the stepladder are locked into place. Ask someone to hold it for you, if possible. Clearly mark and make sure that you can see: Any grab bars or handrails. First and last steps. Where the edge of each step is. Use tools that help you move around (mobility aids) if they are needed. These include: Canes. Walkers. Scooters. Crutches. Turn on the lights when you go into a dark area. Replace any light bulbs as soon as they burn out. Set up your furniture so you have a clear path. Avoid moving your furniture around. If any of your floors are uneven, fix them. If there are any pets around you, be aware of where they are. Review your medicines with your doctor. Some medicines can make you feel dizzy. This can increase your chance of falling. Ask your doctor what other things that you can do to help prevent falls. This information is not intended to replace advice given to you by your health care provider. Make sure you discuss any questions you have with your health care provider. Document Released: 07/30/2009 Document Revised: 03/10/2016 Document Reviewed: 11/07/2014 Elsevier Interactive Patient Education  2017 [employer].

## 2021-09-20 NOTE — Progress Notes (Addendum)
Virtual Visit via Telephone Note  I connected with  Theresa [name] on 09/20/21 at  8:20 AM EST by telephone and verified that I am speaking with the correct person using two identifiers.  Location: Patient: home Provider: [facility] Persons participating in the virtual visit: Roy   I discussed the limitations, risks, security and privacy concerns of performing an evaluation and management service by telephone and the availability of in person appointments. The patient expressed understanding and agreed to proceed.  Interactive audio and video telecommunications were attempted between this nurse and patient, however failed, due to patient having technical difficulties OR patient did not have access to video capability.  We continued and completed visit with audio only.  Some vital signs may be absent or patient reported.   Theresa David, LPN    Subjective:   Theresa [name] is a 65 y.o. female who presents for Medicare Annual (Subsequent) preventive examination.  Review of Systems    Cardiac Risk Factors include: sedentary lifestyle     Objective:    Today's Vitals   09/20/21 0824  PainSc: 0-No pain   There is no height or weight on file to calculate BMI.  Advanced Directives 09/20/2021 08/07/2021 08/06/2021 06/23/2021 05/13/2021 02/25/2021 02/04/2021  Does Patient Have a Medical Advance Directive? _0  No No  Type of Advance Directive - - - - - - -  Does patient want to make changes to medical advance directive? - - - - - - -  Copy of [place] in Chart? - - - - - - -  Would patient like information on creating a medical advance directive? Yes (ED - Information included in AVS) No - Patient declined - - No - Patient declined - -  Some encounter information is confidential and restricted. Go to Review Flowsheets activity to see all data.    Current Medications (verified) Outpatient Encounter Medications as of 09/20/2021  Medication  Sig   albuterol (VENTOLIN HFA) 108 (90 Base) MCG/ACT inhaler Inhale 2 puffs into the lungs every 6 (six) hours as needed for wheezing or shortness of breath.   aspirin EC 81 MG tablet Take 81 mg by mouth daily.   Cholecalciferol 50 MCG (2000 UT) CAPS Take 2,000 Units by mouth daily.    Fluticasone-Umeclidin-Vilant 100-62.5-25 MCG/INH AEPB Inhale 1 puff into the lungs daily.   ipratropium-albuterol (DUONEB) 0.5-2.5 (3) MG/3ML SOLN USE 1 VIAL VIA NEBULIZER EVERY 6 HOURS AS NEEDED FOR SHORTNESS OF BREATH OR WHEEZING   levothyroxine (SYNTHROID) 25 MCG tablet TAKE 1 TABLET(25 MCG) BY MOUTH DAILY BEFORE BREAKFAST   OXYGEN Inhale 2.5 L/min into the lungs.   potassium chloride SA (KLOR-CON) 20 MEQ tablet TAKE 1 TABLET(20 MEQ) BY MOUTH TWICE DAILY   Roflumilast (DALIRESP) 250 MCG TABS Take 500 mcg by mouth daily as needed.   sertraline (ZOLOFT) 100 MG tablet TAKE 2 TABLETS BY MOUTH EVERY MORNING   simvastatin (ZOCOR) 20 MG tablet Take 1 tablet (20 mg total) by mouth at bedtime.   torsemide (DEMADEX) 20 MG tablet Take 2 tablets (40 mg total) by mouth 2 (two) times daily.   No facility-administered encounter medications on file as of 09/20/2021.    Allergies (verified) Codeine and Meloxicam   History: Past Medical History:  Diagnosis Date   Acute postoperative pain 10/03/2017   Anxiety    BiPAP (biphasic positive airway pressure) dependence    at hs   Carpal tunnel syndrome    CHF (congestive heart  failure) ([place])    1/18   CHF (congestive heart failure) (H[facility])    Chronic generalized abdominal pain    Chronic rhinitis    COPD (chronic obstructive pulmonary disease) (HCC)    DDD (degenerative disc disease), cervical    DDD (degenerative disc disease), lumbosacral    Depression    Dyspnea    Edema    Flu    1/18   Gastritis    GERD (gastroesophageal reflux disease)    Hematuria    Hernia of abdominal wall 07/17/2018   Hernia, abdominal    Hypertension    Kidney stones    Low back pain     Lumbar radiculitis    Malodorous urine    Muscle weakness    Obesity    Oxygen dependent    Renal cyst    Sensory urge incontinence    Thyroid activity decreased    9/19   Tobacco abuse    Wheezing    Past Surgical History:  Procedure Laterality Date   ABDOMINAL HYSTERECTOMY     CARPAL TUNNEL RELEASE Left 2012   EPIGASTRIC HERNIA REPAIR N/A 08/13/2018   HERNIA REPAIR EPIGASTRIC ADULT; polypropylene mesh reinforcement.  Surgeon: Robert Bellow, MD;  Location: [facility];  Service: General;  Laterality: N/A;   RIGHT/LEFT HEART CATH AND CORONARY ANGIOGRAPHY N/A 07/11/2019   Procedure: RIGHT/LEFT HEART CATH AND CORONARY ANGIOGRAPHY;  Surgeon: Yolonda Kida, MD;  Location: [facility];  Service: Cardiovascular;  Laterality: N/A;   TONSILLECTOMY     TUBAL LIGATION     UMBILICAL HERNIA REPAIR N/A 08/13/2018   HERNIA REPAIR UMBILICAL ADULT; polypropylene mesh reinforcement Surgeon: Robert Bellow, MD;  Location: [facility];  Service: General;  Laterality: N/A;   Family History  Problem Relation Age of Onset   Heart disease Mother    Stroke Mother    Coronary artery disease Mother    Lung cancer Sister    Cancer Sister    Breast cancer Sister    Alcohol abuse Father    Heart disease Father    Cancer Brother    Cancer Brother    Pneumonia Brother    Prostate cancer Neg Hx    Kidney cancer Neg Hx    Bladder Cancer Neg Hx    Social History   Socioeconomic History   Marital status: Married    Spouse name: Ovid Curd   Number of children: 2   Years of education: Not on file   Highest education level: 12th grade  Occupational History   Occupation: disability  Tobacco Use   Smoking status: Every Day    Packs/day: 0.50    Years: 44.00    Pack years: 22.00    Types: Cigarettes   Smokeless tobacco: Never   Tobacco comments:    over a half pack  Vaping Use   Vaping Use: Never used  Substance and Sexual Activity   Alcohol use: No   Drug use: No   Sexual  activity: Not Currently    Birth control/protection: Surgical  Other Topics Concern   Not on file  Social History Narrative   ** Merged History Encounter **       Lives with spouse    Social Determinants of Health   Financial Resource Strain: Low Risk    Difficulty of Paying Living Expenses: Not very hard  Food Insecurity: No Food Insecurity   Worried About Running Out of Food in the Last Year: Never true   Ran Out  of Food in the Last Year: Never true  Transportation Needs: No Transportation Needs   Lack of Transportation (Medical): No   Lack of Transportation (Non-Medical): No  Physical Activity: Inactive   Days of Exercise per Week: 0 days   Minutes of Exercise per Session: 0 min  Stress: No Stress Concern Present   Feeling of Stress : Not at all  Social Connections: Moderately Integrated   Frequency of Communication with Friends and Family: More than three times a week   Frequency of Social Gatherings with Friends and Family: Once a week   Attends Religious Services: More than 4 times per year   Active Member of [employer] or Organizations: No   Attends Music therapist: Never   Marital Status: Married    Tobacco Counseling Ready to quit: Not Answered Counseling given: Not Answered Tobacco comments: over a half pack   Clinical Intake:  Pre-visit preparation completed: Yes  Pain : No/denies pain Pain Score: 0-No pain     Diabetes: No  How often do you need to have someone help you when you read instructions, pamphlets, or other written materials from your doctor or pharmacy?: 1 - Never  Diabetic?no  Interpreter Needed?: No  Information entered by :: Kirke Shaggy, LPN   Activities of Daily Living In your present state of health, do you have any difficulty performing the following activities: 09/20/2021 08/07/2021  Hearing? N N  Vision? N N  Difficulty concentrating or making decisions? N N  Walking or climbing stairs? Y N  Dressing or bathing?  N N  Doing errands, shopping? N N  Preparing Food and eating ? N -  Using the Toilet? N -  In the past six months, have you accidently leaked urine? N -  Do you have problems with loss of bowel control? N -  Managing your Medications? N -  Managing your Finances? N -  Housekeeping or managing your Housekeeping? N -  Some recent data might be hidden    Patient Care Team: Pcp, No as PCP - General Jackelyn Hoehn, Aura Fey, FNP as Nurse Practitioner (Cardiology) Yolonda Kida, MD as Consulting Physician (Cardiology) Erby Pian, MD as Referring Physician (Pulmonary Disease) Harlin Heys, MD as Consulting Physician (Obstetrics and Gynecology) Milinda Pointer, MD as Referring Physician (Pain Medicine) Idelle Leech, OD as Consulting Physician (Optometry) Anabel Bene, MD as Referring Physician (Neurology) Meade Maw, MD as Consulting Physician (Neurosurgery) Neldon Labella, RN as Registered Nurse  Indicate any recent Medical Services you may have received from other than [facility] providers in the past year (date may be approximate).     Assessment:   This is a routine wellness examination for [facility].  Hearing/Vision screen Hearing Screening - Comments:: Hearing is ok Vision Screening - Comments:: Cataract surgery on left eye, right eye coming up Sees Dr.Knight - sees surgeon in [place] issues and exercise activities discussed: Current Exercise Habits: The patient does not participate in regular exercise at present, Exercise limited by: respiratory conditions(s)   Goals Addressed             This Visit's Progress    Weight (lb) < 200 lb (90.7 kg)       184lb, like to get down to 150#       Depression Screen PHQ 2/9 Scores 09/20/2021 08/31/2021 06/10/2021 05/13/2021 02/25/2021 07/03/2020 06/18/2020  PHQ - 2 Score 0 0 3 0 0 6 0  PHQ- 9 Score - - 12 - -  21 -  Exception Documentation - - - - - - -  Some encounter information is confidential and  restricted. Go to Review Flowsheets activity to see all data.    Fall Risk Fall Risk  09/20/2021 08/31/2021 07/28/2021 06/15/2021 06/10/2021  Falls in the past year? 1 0 0 0 1  Comment - - - - -  Number falls in past yr: 1 - - 0 1  Injury with Fall? 0 - - 0 0  Comment - - - - -  Risk Factor Category  - - - - -  Risk for fall due to : History of fall(s) - - - -  Risk for fall due to: Comment - - - - -  Follow up Falls prevention discussed - - Falls evaluation completed -  Some encounter information is confidential and restricted. Go to Review Flowsheets activity to see all data.    FALL RISK PREVENTION PERTAINING TO THE HOME:  Any stairs in or around the home? Yes  If so, are there any without handrails? No  Home free of loose throw rugs in walkways, pet beds, electrical cords, etc? Yes  Adequate lighting in your home to reduce risk of falls? Yes   ASSISTIVE DEVICES UTILIZED TO PREVENT FALLS:  Life alert? No  Use of a cane, walker or w/c? No  Grab bars in the bathroom? Yes  Shower chair or bench in shower? Yes  Elevated toilet seat or a handicapped toilet? No   Cognitive Function:Normal cognitive status assessed by direct observation by this Nurse Health Advisor. No abnormalities found.          Immunizations Immunization History  Administered Date(s) Administered   Influenza Split 09/02/2014   Influenza,inj,Quad PF,6+ Mos 07/01/2015, 08/24/2017, 07/13/2018, 07/07/2019, 07/03/2020, 09/03/2021   Influenza-Unspecified 08/29/2016   Pneumococcal Polysaccharide-23 07/01/2015, 11/03/2016   Pneumococcal-Unspecified 07/24/2011    TDAP status: Due, Education has been provided regarding the importance of this vaccine. Advised may receive this vaccine at local pharmacy or Health Dept. Aware to provide a copy of the vaccination record if obtained from local pharmacy or Health Dept. Verbalized acceptance and understanding.  Flu Vaccine status: Up to date  Pneumococcal vaccine status:  Up to date  Covid-19 vaccine status: Declined, Education has been provided regarding the importance of this vaccine but patient still declined. Advised may receive this vaccine at local pharmacy or Health Dept.or vaccine clinic. Aware to provide a copy of the vaccination record if obtained from local pharmacy or Health Dept. Verbalized acceptance and understanding.  Qualifies for Shingles Vaccine? Yes   Zostavax completed No   Shingrix Completed?: No.    Education has been provided regarding the importance of this vaccine. Patient has been advised to call insurance company to determine out of pocket expense if they have not yet received this vaccine. Advised may also receive vaccine at local pharmacy or Health Dept. Verbalized acceptance and understanding.  Screening Tests Health Maintenance  Topic Date Due   COVID-19 Vaccine (1) Never done   TETANUS/TDAP  Never done   Zoster Vaccines- Shingrix (1 of 2) Never done   Pneumococcal Vaccine 67-81 Years old (3 - PCV) 11/03/2017   MAMMOGRAM  11/18/2022   COLONOSCOPY (Pts 45-3yr Insurance coverage will need to be confirmed)  01/22/2024   PAP SMEAR-Modifier  07/08/2024   INFLUENZA VACCINE  Completed   Hepatitis C Screening  Completed   HIV Screening  Completed   HPV VACCINES  Aged Out    Health Maintenance  Health  Maintenance Due  Topic Date Due   COVID-19 Vaccine (1) Never done   TETANUS/TDAP  Never done   Zoster Vaccines- Shingrix (1 of 2) Never done   Pneumococcal Vaccine 55-40 Years old (3 - PCV) 11/03/2017    Colorectal cancer screening: Type of screening: Colonoscopy. Completed 07/05/17. Repeat every 10 years  Mammogram status: Completed 11/18/20. Repeat every year  Bone Density status: Completed 05/23/17. Results reflect: Bone density results: NORMAL. Repeat every 5 years.  Lung Cancer Screening: (Low Dose CT Chest recommended if Age 56-80 years, 30 pack-year currently smoking OR have quit w/in 15years.) does qualify.     Additional Screening:  Hepatitis C Screening: does qualify; Completed 05/31/11  Vision Screening: Recommended annual ophthalmology exams for early detection of glaucoma and other disorders of the eye. Is the patient up to date with their annual eye exam?  Yes  Who is the provider or what is the name of the office in which the patient attends annual eye exams? Dr.Knight   Dental Screening: Recommended annual dental exams for proper oral hygiene  Community Resource Referral / Chronic Care Management: CRR required this visit?  No   CCM required this visit?  No      Plan:     I have personally reviewed and noted the following in the patient's chart:   Medical and social history Use of alcohol, tobacco or illicit drugs  Current medications and supplements including opioid prescriptions.  Functional ability and status Nutritional status Physical activity Advanced directives List of other physicians Hospitalizations, surgeries, and ER visits in previous 12 months Vitals Screenings to include cognitive, depression, and falls Referrals and appointments  In addition, I have reviewed and discussed with patient certain preventive protocols, quality metrics, and best practice recommendations. A written personalized care plan for preventive services as well as general preventive health recommendations were provided to patient.     Theresa David, LPN   31/03/7424   Nurse Notes: note  I have reviewed the health advisor's note, was available for consultation, and agree with documentation and plan  Lelon Huh, MD

## 2021-09-21 ENCOUNTER — Telehealth: Payer: Self-pay

## 2021-09-21 DIAGNOSIS — I5032 Chronic diastolic (congestive) heart failure: Secondary | ICD-10-CM | POA: Diagnosis not present

## 2021-09-21 DIAGNOSIS — I272 Pulmonary hypertension, unspecified: Secondary | ICD-10-CM | POA: Diagnosis not present

## 2021-09-21 DIAGNOSIS — F32A Depression, unspecified: Secondary | ICD-10-CM | POA: Diagnosis not present

## 2021-09-21 DIAGNOSIS — J441 Chronic obstructive pulmonary disease with (acute) exacerbation: Secondary | ICD-10-CM | POA: Diagnosis not present

## 2021-09-21 DIAGNOSIS — E039 Hypothyroidism, unspecified: Secondary | ICD-10-CM | POA: Diagnosis not present

## 2021-09-21 DIAGNOSIS — F411 Generalized anxiety disorder: Secondary | ICD-10-CM | POA: Diagnosis not present

## 2021-09-21 DIAGNOSIS — G4730 Sleep apnea, unspecified: Secondary | ICD-10-CM | POA: Diagnosis not present

## 2021-09-21 DIAGNOSIS — J9621 Acute and chronic respiratory failure with hypoxia: Secondary | ICD-10-CM | POA: Diagnosis not present

## 2021-09-21 DIAGNOSIS — I110 Hypertensive heart disease with heart failure: Secondary | ICD-10-CM | POA: Diagnosis not present

## 2021-09-21 NOTE — Telephone Encounter (Signed)
  Care Management   Follow Up Note   09/21/2021 Name: Theresa [name] MRN: [id number] DOB: Oct 21, 1955   Reason for referral : Chronic Care Management   An unsuccessful telephone outreach was attempted today. The patient was referred to the case management team for assistance with care management and care coordination.   Follow Up Plan:  A member of the care management team will follow up to schedule a telephonic outreach.   Cristy [facility] Care Management [facility] [phone]

## 2021-09-22 ENCOUNTER — Telehealth: Payer: Self-pay | Admitting: *Deleted

## 2021-09-22 NOTE — Chronic Care Management (AMB) (Signed)
  Care Management   Note  09/22/2021 Name: Theresa [name] MRN: [id number] DOB: 01-18-1956  Theresa [name] is a 65 y.o. year old female who is a primary care patient of Pcp, No and is actively engaged with the care management team. I reached out to [name] by phone today to assist with re-scheduling an initial visit with the RN Case Manager  Follow up plan: Telephone appointment with care management team member scheduled for: 09/24/2021  Julian Hy, [place], [place] Management  Direct Dial: [phone]

## 2021-09-23 ENCOUNTER — Other Ambulatory Visit: Payer: Self-pay

## 2021-09-23 ENCOUNTER — Ambulatory Visit (HOSPITAL_BASED_OUTPATIENT_CLINIC_OR_DEPARTMENT_OTHER): Payer: Medicare HMO | Admitting: Pain Medicine

## 2021-09-23 ENCOUNTER — Encounter: Payer: Self-pay | Admitting: Pain Medicine

## 2021-09-23 ENCOUNTER — Ambulatory Visit
Admission: RE | Admit: 2021-09-23 | Discharge: 2021-09-23 | Disposition: A | Discharge status: Home / Self Care | Payer: Medicare HMO | Source: Ambulatory Visit | Attending: Pain Medicine | Admitting: Pain Medicine

## 2021-09-23 VITALS — BP 115/66 | HR 92 | Temp 97.1°F | Resp 16 | Ht 62.0 in | Wt 177.0 lb

## 2021-09-23 DIAGNOSIS — M79605 Pain in left leg: Secondary | ICD-10-CM | POA: Diagnosis not present

## 2021-09-23 DIAGNOSIS — I272 Pulmonary hypertension, unspecified: Secondary | ICD-10-CM | POA: Diagnosis not present

## 2021-09-23 DIAGNOSIS — M545 Low back pain, unspecified: Secondary | ICD-10-CM | POA: Diagnosis not present

## 2021-09-23 DIAGNOSIS — M5116 Intervertebral disc disorders with radiculopathy, lumbar region: Secondary | ICD-10-CM

## 2021-09-23 DIAGNOSIS — E039 Hypothyroidism, unspecified: Secondary | ICD-10-CM | POA: Diagnosis not present

## 2021-09-23 DIAGNOSIS — M25551 Pain in right hip: Secondary | ICD-10-CM | POA: Diagnosis not present

## 2021-09-23 DIAGNOSIS — M5416 Radiculopathy, lumbar region: Secondary | ICD-10-CM | POA: Insufficient documentation

## 2021-09-23 DIAGNOSIS — M25562 Pain in left knee: Secondary | ICD-10-CM | POA: Insufficient documentation

## 2021-09-23 DIAGNOSIS — G8929 Other chronic pain: Secondary | ICD-10-CM

## 2021-09-23 DIAGNOSIS — J9621 Acute and chronic respiratory failure with hypoxia: Secondary | ICD-10-CM | POA: Diagnosis not present

## 2021-09-23 DIAGNOSIS — F32A Depression, unspecified: Secondary | ICD-10-CM | POA: Diagnosis not present

## 2021-09-23 DIAGNOSIS — M47816 Spondylosis without myelopathy or radiculopathy, lumbar region: Secondary | ICD-10-CM

## 2021-09-23 DIAGNOSIS — M25552 Pain in left hip: Secondary | ICD-10-CM | POA: Diagnosis present

## 2021-09-23 DIAGNOSIS — M5136 Other intervertebral disc degeneration, lumbar region: Secondary | ICD-10-CM | POA: Insufficient documentation

## 2021-09-23 DIAGNOSIS — I110 Hypertensive heart disease with heart failure: Secondary | ICD-10-CM | POA: Diagnosis not present

## 2021-09-23 DIAGNOSIS — M51369 Other intervertebral disc degeneration, lumbar region without mention of lumbar back pain or lower extremity pain: Secondary | ICD-10-CM

## 2021-09-23 DIAGNOSIS — G4730 Sleep apnea, unspecified: Secondary | ICD-10-CM | POA: Diagnosis not present

## 2021-09-23 DIAGNOSIS — F411 Generalized anxiety disorder: Secondary | ICD-10-CM | POA: Diagnosis not present

## 2021-09-23 DIAGNOSIS — M5126 Other intervertebral disc displacement, lumbar region: Secondary | ICD-10-CM

## 2021-09-23 DIAGNOSIS — J441 Chronic obstructive pulmonary disease with (acute) exacerbation: Secondary | ICD-10-CM | POA: Diagnosis not present

## 2021-09-23 DIAGNOSIS — I5032 Chronic diastolic (congestive) heart failure: Secondary | ICD-10-CM | POA: Diagnosis not present

## 2021-09-23 MED ORDER — SODIUM CHLORIDE (PF) 0.9 % IJ SOLN
INTRAMUSCULAR | Status: AC
  Filled 2021-09-23: qty 10

## 2021-09-23 MED ORDER — MIDAZOLAM HCL 5 MG/5ML IJ SOLN: 0.5000 mg | Freq: Once | INTRAMUSCULAR | Status: DC

## 2021-09-23 MED ORDER — TRIAMCINOLONE ACETONIDE 40 MG/ML IJ SUSP
40.0000 mg | Freq: Once | INTRAMUSCULAR | Status: AC
  Administered 2021-09-23: 40 mg

## 2021-09-23 MED ORDER — ROPIVACAINE HCL 2 MG/ML IJ SOLN
2.0000 mL | Freq: Once | INTRAMUSCULAR | Status: AC
  Administered 2021-09-23: 2 mL via EPIDURAL

## 2021-09-23 MED ORDER — SODIUM CHLORIDE 0.9% FLUSH
2.0000 mL | Freq: Once | INTRAVENOUS | Status: AC
  Administered 2021-09-23: 2 mL

## 2021-09-23 MED ORDER — LIDOCAINE HCL 2 % IJ SOLN
INTRAMUSCULAR | Status: AC
  Filled 2021-09-23: qty 10

## 2021-09-23 MED ORDER — PENTAFLUOROPROP-TETRAFLUOROETH EX AERO
INHALATION_SPRAY | Freq: Once | CUTANEOUS | Status: AC
  Administered 2021-09-23: 30 via TOPICAL
  Filled 2021-09-23: qty 116

## 2021-09-23 MED ORDER — IOHEXOL 180 MG/ML  SOLN
10.0000 mL | Freq: Once | INTRAMUSCULAR | Status: AC
  Administered 2021-09-23: 5 mL via EPIDURAL

## 2021-09-23 MED ORDER — IOHEXOL 180 MG/ML  SOLN
INTRAMUSCULAR | Status: AC
  Filled 2021-09-23: qty 10

## 2021-09-23 MED ORDER — ROPIVACAINE HCL 2 MG/ML IJ SOLN
INTRAMUSCULAR | Status: AC
  Filled 2021-09-23: qty 20

## 2021-09-23 MED ORDER — LIDOCAINE HCL 2 % IJ SOLN
20.0000 mL | Freq: Once | INTRAMUSCULAR | Status: AC
  Administered 2021-09-23: 200 mg

## 2021-09-23 MED ORDER — LACTATED RINGERS IV SOLN: 1000.0000 mL | Freq: Once | INTRAVENOUS | Status: DC

## 2021-09-23 MED ORDER — TRIAMCINOLONE ACETONIDE 40 MG/ML IJ SUSP
INTRAMUSCULAR | Status: AC
  Filled 2021-09-23: qty 1

## 2021-09-23 NOTE — Addendum Note (Signed)
Addended by: Milinda Pointer A on: 09/23/2021 01:16 PM   Modules accepted: Orders

## 2021-09-23 NOTE — Patient Instructions (Addendum)
____________________________________________________________________________________________  Post-Procedure Discharge Instructions  Instructions: Apply ice:  Purpose: This will minimize any swelling and discomfort after procedure.  When: Day of procedure, as soon as you get home. How: Fill a plastic sandwich bag with crushed ice. Cover it with a small towel and apply to injection site. How long: (15 min on, 15 min off) Apply for 15 minutes then remove x 15 minutes.  Repeat sequence on day of procedure, until you go to bed. Apply heat:  Purpose: To treat any soreness and discomfort from the procedure. When: Starting the next day after the procedure. How: Apply heat to procedure site starting the day following the procedure. How long: May continue to repeat daily, until discomfort goes away. Food intake: Start with clear liquids (like water) and advance to regular food, as tolerated.  Physical activities: Keep activities to a minimum for the first 8 hours after the procedure. After that, then as tolerated. Driving: If you have received any sedation, be responsible and do not drive. You are not allowed to drive for 24 hours after having sedation. Blood thinner: (Applies only to those taking blood thinners) You may restart your blood thinner 6 hours after your procedure. Insulin: (Applies only to Diabetic patients taking insulin) As soon as you can eat, you may resume your normal dosing schedule. Infection prevention: Keep procedure site clean and dry. Shower daily and clean area with soap and water. Post-procedure Pain Diary: Extremely important that this be done correctly and accurately. Recorded information will be used to determine the next step in treatment. For the purpose of accuracy, follow these rules: Evaluate only the area treated. Do not report or include pain from an untreated area. For the purpose of this evaluation, ignore all other areas of pain, except for the treated area. After  your procedure, avoid taking a long nap and attempting to complete the pain diary after you wake up. Instead, set your alarm clock to go off every hour, on the hour, for the initial 8 hours after the procedure. Document the duration of the numbing medicine, and the relief you are getting from it. Do not go to sleep and attempt to complete it later. It will not be accurate. If you received sedation, it is likely that you were given a medication that may cause amnesia. Because of this, completing the diary at a later time may cause the information to be inaccurate. This information is needed to plan your care. Follow-up appointment: Keep your post-procedure follow-up evaluation appointment after the procedure (usually 2 weeks for most procedures, 6 weeks for radiofrequencies). DO NOT FORGET to bring you pain diary with you.   Expect: (What should I expect to see with my procedure?) From numbing medicine (AKA: Local Anesthetics): Numbness or decrease in pain. You may also experience some weakness, which if present, could last for the duration of the local anesthetic. Onset: Full effect within 15 minutes of injected. Duration: It will depend on the type of local anesthetic used. On the average, 1 to 8 hours.  From steroids (Applies only if steroids were used): Decrease in swelling or inflammation. Once inflammation is improved, relief of the pain will follow. Onset of benefits: Depends on the amount of swelling present. The more swelling, the longer it will take for the benefits to be seen. In some cases, up to 10 days. Duration: Steroids will stay in the system x 2 weeks. Duration of benefits will depend on multiple posibilities including persistent irritating factors. Side-effects: If present, they  may typically last 2 weeks (the duration of the steroids). Frequent: Cramps (if they occur, drink Gatorade and take over-the-counter Magnesium 450-500 mg once to twice a day); water retention with temporary  weight gain; increases in blood sugar; decreased immune system response; increased appetite. Occasional: Facial flushing (red, warm cheeks); mood swings; menstrual changes. Uncommon: Long-term decrease or suppression of natural hormones; bone thinning. (These are more common with higher doses or more frequent use. This is why we prefer that our patients avoid having any injection therapies in other practices.)  Very Rare: Severe mood changes; psychosis; aseptic necrosis. From procedure: Some discomfort is to be expected once the numbing medicine wears off. This should be minimal if ice and heat are applied as instructed.  Call if: (When should I call?) You experience numbness and weakness that gets worse with time, as opposed to wearing off. New onset bowel or bladder incontinence. (Applies only to procedures done in the spine)  Emergency Numbers: Durning business hours (Monday - Thursday, 8:00 AM - 4:00 PM) (Friday, 9:00 AM - 12:00 Noon): [phone] After hours: [phone] NOTE: If you are having a problem and are unable connect with, or to talk to a provider, then go to your nearest urgent care or emergency department. If the problem is serious and urgent, please call 911. ____________________________________________________________________________________________  Pain Management Discharge Instructions  General Discharge Instructions :  If you need to reach your doctor call: Monday-Friday 8:00 am - 4:00 pm at [phone] or toll free [phone].  After clinic hours [phone] to have operator reach doctor.  Bring all of your medication bottles to all your appointments in the pain clinic.  To cancel or reschedule your appointment with Pain Management please remember to call 24 hours in advance to avoid a fee.  Refer to the educational materials which you have been given on: General Risks, I had my Procedure. Discharge Instructions, Post Sedation.  Post Procedure  Instructions:  The drugs you were given will stay in your system until tomorrow, so for the next 24 hours you should not drive, make any legal decisions or drink any alcoholic beverages.  You may eat anything you prefer, but it is better to start with liquids then soups and crackers, and gradually work up to solid foods.  Please notify your doctor immediately if you have any unusual bleeding, trouble breathing or pain that is not related to your normal pain.  Depending on the type of procedure that was done, some parts of your body may feel week and/or numb.  This usually clears up by tonight or the next day.  Walk with the use of an assistive device or accompanied by an adult for the 24 hours.  You may use ice on the affected area for the first 24 hours.  Put ice in a Ziploc bag and cover with a towel and place against area 15 minutes on 15 minutes off.  You may switch to heat after 24 hours.Epidural Steroid Injection Patient Information  Description: The epidural space surrounds the nerves as they exit the spinal cord.  In some patients, the nerves can be compressed and inflamed by a bulging disc or a tight spinal canal (spinal stenosis).  By injecting steroids into the epidural space, we can bring irritated nerves into direct contact with a potentially helpful medication.  These steroids act directly on the irritated nerves and can reduce swelling and inflammation which often leads to decreased pain.  Epidural steroids may be injected anywhere along the  spine and from the neck to the low back depending upon the location of your pain.   After numbing the skin with local anesthetic (like Novocaine), a small needle is passed into the epidural space slowly.  You may experience a sensation of pressure while this is being done.  The entire block usually last less than 10 minutes.  Conditions which may be treated by epidural steroids:  Low back and leg pain Neck and arm pain Spinal  stenosis Post-laminectomy syndrome Herpes zoster (shingles) pain Pain from compression fractures  Preparation for the injection:  Do not eat any solid food or dairy products within 8 hours of your appointment.  You may drink clear liquids up to 3 hours before appointment.  Clear liquids include water, black coffee, juice or soda.  No milk or cream please. You may take your regular medication, including pain medications, with a sip of water before your appointment  Diabetics should hold regular insulin (if taken separately) and take 1/2 normal NPH dos the morning of the procedure.  Carry some sugar containing items with you to your appointment. A driver must accompany you and be prepared to drive you home after your procedure.  Bring all your current medications with your. An IV may be inserted and sedation may be given at the discretion of the physician.   A blood pressure cuff, EKG and other monitors will often be applied during the procedure.  Some patients may need to have extra oxygen administered for a short period. You will be asked to provide medical information, including your allergies, prior to the procedure.  We must know immediately if you are taking blood thinners (like Coumadin/Warfarin)  Or if you are allergic to IV iodine contrast (dye). We must know if you could possible be pregnant.  Possible side-effects: Bleeding from needle site Infection (rare, may require surgery) Nerve injury (rare) Numbness & tingling (temporary) Difficulty urinating (rare, temporary) Spinal headache ( a headache worse with upright posture) Light -headedness (temporary) Pain at injection site (several days) Decreased blood pressure (temporary) Weakness in arm/leg (temporary) Pressure sensation in back/neck (temporary)  Call if you experience: Fever/chills associated with headache or increased back/neck pain. Headache worsened by an upright position. New onset weakness or numbness of an  extremity below the injection site Hives or difficulty breathing (go to the emergency room) Inflammation or drainage at the infection site Severe back/neck pain Any new symptoms which are concerning to you  Please note:  Although the local anesthetic injected can often make your back or neck feel good for several hours after the injection, the pain will likely return.  It takes 3-7 days for steroids to work in the epidural space.  You may not notice any pain relief for at least that one week.  If effective, we will often do a series of three injections spaced 3-6 weeks apart to maximally decrease your pain.  After the initial series, we generally will wait several months before considering a repeat injection of the same type.  If you have any questions, please call [phone] [facility]   ______________________________________________________________________  Preparing for Procedure with Sedation  NOTICE: Due to recent regulatory changes, starting on May 17, 2021, procedures requiring intravenous (IV) sedation will no longer be performed at the [facility].  These types of procedures are required to be performed at [facility] ambulatory surgery facility.  We are very sorry for the inconvenience.  Procedure appointments are limited to planned procedures:  No Prescription Refills. No disability issues will be discussed. No medication changes will be discussed.  Instructions: Oral Intake: Do not eat or drink anything for at least 8 hours prior to your procedure. (Exception: Blood Pressure Medication. See below.) Transportation: A driver is required. You may not drive yourself after the procedure. Blood Pressure Medicine: Do not forget to take your blood pressure medicine with a sip of water the morning of the procedure. If your Diastolic (lower reading) is above 100 mmHg, elective cases will be cancelled/rescheduled. Blood thinners: These will  need to be stopped for procedures. Notify our staff if you are taking any blood thinners. Depending on which one you take, there will be specific instructions on how and when to stop it. Diabetics on insulin: Notify the staff so that you can be scheduled 1st case in the morning. If your diabetes requires high dose insulin, take only  of your normal insulin dose the morning of the procedure and notify the staff that you have done so. Preventing infections: Shower with an antibacterial soap the morning of your procedure. Build-up your immune system: Take 1000 mg of Vitamin C with every meal (3 times a day) the day prior to your procedure. Antibiotics: Inform the staff if you have a condition or reason that requires you to take antibiotics before dental procedures. Pregnancy: If you are pregnant, call and cancel the procedure. Sickness: If you have a cold, fever, or any active infections, call and cancel the procedure. Arrival: You must be in the facility at least 30 minutes prior to your scheduled procedure. Children: Do not bring children with you. Dress appropriately: Bring dark clothing that you would not mind if they get stained. Valuables: Do not bring any jewelry or valuables.  Reasons to call and reschedule or cancel your procedure: (Following these recommendations will minimize the risk of a serious complication.) Surgeries: Avoid having procedures within 2 weeks of any surgery. (Avoid for 2 weeks before or after any surgery). Flu Shots: Avoid having procedures within 2 weeks of a flu shots. (Avoid for 2 weeks before or after immunizations). Barium: Avoid having a procedure within 7-10 days after having had a radiological study involving the use of radiological contrast. (Myelograms, Barium swallow or enema study). Heart attacks: Avoid any elective procedures or surgeries for the initial 6 months after a "Myocardial Infarction" (Heart Attack). Blood thinners: It is imperative that you stop  these medications before procedures. Let us know if you if you take any blood thinner.  Infection: Avoid procedures during or within two weeks of an infection (including chest colds or gastrointestinal problems). Symptoms associated with infections include: Localized redness, fever, chills, night sweats or profuse sweating, burning sensation when voiding, cough, congestion, stuffiness, runny nose, sore throat, diarrhea, nausea, vomiting, cold or Flu symptoms, recent or current infections. It is specially important if the infection is over the area that we intend to treat. Heart and lung problems: Symptoms that may suggest an active cardiopulmonary problem include: cough, chest pain, breathing difficulties or shortness of breath, dizziness, ankle swelling, uncontrolled high or unusually low blood pressure, and/or palpitations. If you are experiencing any of these symptoms, cancel your procedure and contact your primary care physician for an evaluation.  Remember:  Regular Business hours are:  Monday to Thursday 8:00 AM to 4:00 PM  Provider's Schedule: Milinda Pointer, MD:  Procedure days: Tuesday and Thursday 7:30 AM to 4:00 PM  Gillis Santa, MD:  Procedure days: Monday and Wednesday 7:30 AM to 4:00  PM ______________________________________________________________________  ____________________________________________________________________________________________  General Risks and Possible Complications  Patient Responsibilities: It is important that you read this as it is part of your informed consent. It is our duty to inform you of the risks and possible complications associated with treatments offered to you. It is your responsibility as a patient to read this and to ask questions about anything that is not clear or that you believe was not covered in this document.  Patient's Rights: You have the right to refuse treatment. You also have the right to change your mind, even after initially  having agreed to have the treatment done. However, under this last option, if you wait until the last second to change your mind, you may be charged for the materials used up to that point.  Introduction: Medicine is not an Chief Strategy Officer. Everything in Medicine, including the lack of treatment(s), carries the potential for danger, harm, or loss (which is by definition: Risk). In Medicine, a complication is a secondary problem, condition, or disease that can aggravate an already existing one. All treatments carry the risk of possible complications. The fact that a side effects or complications occurs, does not imply that the treatment was conducted incorrectly. It must be clearly understood that these can happen even when everything is done following the highest safety standards.  No treatment: You can choose not to proceed with the proposed treatment alternative. The "PRO(s)" would include: avoiding the risk of complications associated with the therapy. The "CON(s)" would include: not getting any of the treatment benefits. These benefits fall under one of three categories: diagnostic; therapeutic; and/or palliative. Diagnostic benefits include: getting information which can ultimately lead to improvement of the disease or symptom(s). Therapeutic benefits are those associated with the successful treatment of the disease. Finally, palliative benefits are those related to the decrease of the primary symptoms, without necessarily curing the condition (example: decreasing the pain from a flare-up of a chronic condition, such as incurable terminal cancer).  General Risks and Complications: These are associated to most interventional treatments. They can occur alone, or in combination. They fall under one of the following six (6) categories: no benefit or worsening of symptoms; bleeding; infection; nerve damage; allergic reactions; and/or death. No benefits or worsening of symptoms: In Medicine there are no  guarantees, only probabilities. No healthcare provider can ever guarantee that a medical treatment will work, they can only state the probability that it may. Furthermore, there is always the possibility that the condition may worsen, either directly, or indirectly, as a consequence of the treatment. Bleeding: This is more common if the patient is taking a blood thinner, either prescription or over the counter (example: Goody Powders, Fish oil, Aspirin, Garlic, etc.), or if suffering a condition associated with impaired coagulation (example: [name], cirrhosis of the liver, low platelet counts, etc.). However, even if you do not have one on these, it can still happen. If you have any of these conditions, or take one of these drugs, make sure to notify your treating physician. Infection: This is more common in patients with a compromised immune system, either due to disease (example: diabetes, cancer, human immunodeficiency virus [HIV], etc.), or due to medications or treatments (example: therapies used to treat cancer and rheumatological diseases). However, even if you do not have one on these, it can still happen. If you have any of these conditions, or take one of these drugs, make sure to notify your treating physician. Nerve Damage: This is more common when the  treatment is an invasive one, but it can also happen with the use of medications, such as those used in the treatment of cancer. The damage can occur to small secondary nerves, or to large primary ones, such as those in the spinal cord and brain. This damage may be temporary or permanent and it may lead to impairments that can range from temporary numbness to permanent paralysis and/or brain death. Allergic Reactions: Any time a substance or material comes in contact with our body, there is the possibility of an allergic reaction. These can range from a mild skin rash (contact dermatitis) to a severe systemic reaction (anaphylactic reaction), which  can result in death. Death: In general, any medical intervention can result in death, most of the time due to an unforeseen complication. ____________________________________________________________________________________________

## 2021-09-23 NOTE — Progress Notes (Addendum)
PROVIDER NOTE: Information contained herein reflects review and annotations entered in association with encounter. Interpretation of such information and data should be left to medically-trained personnel. Information provided to patient can be located elsewhere in the medical record under "Patient Instructions". Document created using STT-dictation technology, any transcriptional errors that may result from process are unintentional.    Patient: Theresa [name]  Service Category: Procedure Provider: Gaspar Cola, MD DOB: January 17, 1956 DOS: 09/23/2021 Location: [facility] MRN: [id number] Setting: Ambulatory - outpatient Referring Provider: Milinda Pointer, MD Type: Established Patient Specialty: Interventional Pain Management PCP: Pcp, No  Primary Reason for Visit: Interventional Pain Management Treatment. CC: Back Pain (Left, lower)   Procedure:          Anesthesia, Analgesia, Anxiolysis:  Type: Therapeutic Inter-Laminar Epidural Steroid Injection  #2  Region: Lumbar Level: L2-3 Level. Laterality: Left Paramedial  Anesthesia: Local (1-2% Lidocaine)  Anxiolysis: None  Sedation: None  Guidance: Fluoroscopy           Position: Prone with head of the table was raised to facilitate breathing.   Indications: 1. DDD (degenerative disc disease), lumbar   2. Chronic left-sided lumbar radiculopathy   3. Lumbar disc extrusion (L2-3) (Left)   4. Chronic low back pain (1ry area of Pain) (Bilateral) (L>R) w/o sciatica    Pain Score: Pre-procedure: 5 /10 Post-procedure: 0-No pain/10    Pre-op H&P Assessment:  Ms. Polizzi is a 65 y.o. (year old), female patient, seen today for interventional treatment. She  has a past surgical history that includes Abdominal hysterectomy; Tonsillectomy; Carpal tunnel release (Left, 2012); Tubal ligation; epigastric hernia repair (N/A, 08/13/2018); Umbilical hernia repair (N/A, 08/13/2018); and RIGHT/LEFT HEART CATH AND CORONARY ANGIOGRAPHY  (N/A, 07/11/2019). Ms. Guiffre has a current medication list which includes the following prescription(s): albuterol, aspirin ec, cholecalciferol, fluticasone-umeclidin-vilant, ipratropium-albuterol, levothyroxine, oxygen-helium, potassium chloride sa, daliresp, sertraline, simvastatin, and torsemide. Her primarily concern today is the Back Pain (Left, lower)  Initial Vital Signs:  Pulse/HCG Rate: 92ECG Heart Rate: 82 Temp: (!) 97.1 F (36.2 C) Resp: 18 BP: 129/74 SpO2: (P) 93 % (O2 at 2.5 liters per BNC)  BMI: Estimated body mass index is 32.37 kg/m as calculated from the following:   Height as of this encounter: _0  (1.575 m).   Weight as of this encounter: 177 lb (80.3 kg).  Risk Assessment: Allergies: Reviewed. She is allergic to codeine and meloxicam.  Allergy Precautions: None required Coagulopathies: Reviewed. None identified.  Blood-thinner therapy: None at this time Active Infection(s): Reviewed. None identified. Ms. Lehrman is afebrile  Site Confirmation: Ms. Humphries was asked to confirm the procedure and laterality before marking the site Procedure checklist: Completed Consent: Before the procedure and under the influence of no sedative(s), amnesic(s), or anxiolytics, the patient was informed of the treatment options, risks and possible complications. To fulfill our ethical and legal obligations, as recommended by the American Medical Association's Code of Ethics, I have informed the patient of my clinical impression; the nature and purpose of the treatment or procedure; the risks, benefits, and possible complications of the intervention; the alternatives, including doing nothing; the risk(s) and benefit(s) of the alternative treatment(s) or procedure(s); and the risk(s) and benefit(s) of doing nothing. The patient was provided information about the general risks and possible complications associated with the procedure. These may include, but are not limited to: failure to  achieve desired goals, infection, bleeding, organ or nerve damage, allergic reactions, paralysis, and death. In addition, the patient was informed of those risks  and complications associated to Spine-related procedures, such as failure to decrease pain; infection (i.e.: Meningitis, epidural or intraspinal abscess); bleeding (i.e.: epidural hematoma, subarachnoid hemorrhage, or any other type of intraspinal or peri-dural bleeding); organ or nerve damage (i.e.: Any type of peripheral nerve, nerve root, or spinal cord injury) with subsequent damage to sensory, motor, and/or autonomic systems, resulting in permanent pain, numbness, and/or weakness of one or several areas of the body; allergic reactions; (i.e.: anaphylactic reaction); and/or death. Furthermore, the patient was informed of those risks and complications associated with the medications. These include, but are not limited to: allergic reactions (i.e.: anaphylactic or anaphylactoid reaction(s)); adrenal axis suppression; blood sugar elevation that in diabetics may result in ketoacidosis or comma; water retention that in patients with history of congestive heart failure may result in shortness of breath, pulmonary edema, and decompensation with resultant heart failure; weight gain; swelling or edema; medication-induced neural toxicity; particulate matter embolism and blood vessel occlusion with resultant organ, and/or nervous system infarction; and/or aseptic necrosis of one or more joints. Finally, the patient was informed that Medicine is not an exact science; therefore, there is also the possibility of unforeseen or unpredictable risks and/or possible complications that may result in a catastrophic outcome. The patient indicated having understood very clearly. We have given the patient no guarantees and we have made no promises. Enough time was given to the patient to ask questions, all of which were answered to the patient's satisfaction. Ms. Modesitt  has indicated that she wanted to continue with the procedure. Attestation: I, the ordering provider, attest that I have discussed with the patient the benefits, risks, side-effects, alternatives, likelihood of achieving goals, and potential problems during recovery for the procedure that I have provided informed consent. Date  Time: 09/23/2021 11:24 AM  Pre-Procedure Preparation:  Monitoring: As per clinic protocol. Respiration, ETCO2, SpO2, BP, heart rate and rhythm monitor placed and checked for adequate function Safety Precautions: Patient was assessed for positional comfort and pressure points before starting the procedure. Time-out: I initiated and conducted the "Time-out" before starting the procedure, as per protocol. The patient was asked to participate by confirming the accuracy of the "Time Out" information. Verification of the correct person, site, and procedure were performed and confirmed by me, the nursing staff, and the patient. "Time-out" conducted as per Joint Commission's Universal Protocol (UP.01.01.01). Time: 1149  Description of Procedure:          Target Area: The interlaminar space, initially targeting the lower laminar border of the superior vertebral body. Approach: Paramedial approach. Area Prepped: Entire Posterior Lumbar Region DuraPrep (Iodine Povacrylex [0.7% available iodine] and Isopropyl Alcohol, 74% w/w) Safety Precautions: Aspiration looking for blood return was conducted prior to all injections. At no point did we inject any substances, as a needle was being advanced. No attempts were made at seeking any paresthesias. Safe injection practices and needle disposal techniques used. Medications properly checked for expiration dates. SDV (single dose vial) medications used. Description of the Procedure: Protocol guidelines were followed. The procedure needle was introduced through the skin, ipsilateral to the reported pain, and advanced to the target area. Bone was  contacted and the needle walked caudad, until the lamina was cleared. The epidural space was identified using "loss-of-resistance technique" with 2-3 ml of PF-NaCl (0.9% NSS), in a 5cc LOR glass syringe.  Vitals:   09/23/21 1124 09/23/21 1144 09/23/21 1149 09/23/21 1153  BP: 129/74 107/72 116/66 115/66  Pulse: 92     Resp: 18 18 16  16  Temp: (!) 97.1 F (36.2 C)     TempSrc: Temporal     SpO2: (P) 93% 100% 100% 97%  Weight: 177 lb (80.3 kg)     Height: _0  (1.575 m)       Start Time: 1149 hrs. End Time: 1153 hrs.  Materials:  Needle(s) Type: Epidural needle Gauge: 17G Length: 3.5-in Medication(s): Please see orders for medications and dosing details.  Imaging Guidance (Spinal):          Type of Imaging Technique: Fluoroscopy Guidance (Spinal) Indication(s): Assistance in needle guidance and placement for procedures requiring needle placement in or near specific anatomical locations not easily accessible without such assistance. Exposure Time: Please see nurses notes. Contrast: Before injecting any contrast, we confirmed that the patient did not have an allergy to iodine, shellfish, or radiological contrast. Once satisfactory needle placement was completed at the desired level, radiological contrast was injected. Contrast injected under live fluoroscopy. No contrast complications. See chart for type and volume of contrast used. Fluoroscopic Guidance: I was personally present during the use of fluoroscopy. "Tunnel Vision Technique" used to obtain the best possible view of the target area. Parallax error corrected before commencing the procedure. "Direction-depth-direction" technique used to introduce the needle under continuous pulsed fluoroscopy. Once target was reached, antero-posterior, oblique, and lateral fluoroscopic projection used confirm needle placement in all planes. Images permanently stored in EMR. Interpretation: I personally interpreted the imaging intraoperatively.  Adequate needle placement confirmed in multiple planes. Appropriate spread of contrast into desired area was observed. No evidence of afferent or efferent intravascular uptake. No intrathecal or subarachnoid spread observed. Permanent images saved into the patient's record.  Antibiotic Prophylaxis:   Anti-infectives (From admission, onward)    None      Indication(s): None identified  Post-operative Assessment:  Post-procedure Vital Signs:  Pulse/HCG Rate: 9283 Temp: (!) 97.1 F (36.2 C) Resp: 16 BP: 115/66 SpO2: 97 %  EBL: None  Complications: No immediate post-treatment complications observed by team, or reported by patient.  Note: The patient tolerated the entire procedure well. A repeat set of vitals were taken after the procedure and the patient was kept under observation following institutional policy, for this type of procedure. Post-procedural neurological assessment was performed, showing return to baseline, prior to discharge. The patient was provided with post-procedure discharge instructions, including a section on how to identify potential problems. Should any problems arise concerning this procedure, the patient was given instructions to immediately contact us, at any time, without hesitation. In any case, we plan to contact the patient by telephone for a follow-up status report regarding this interventional procedure.  Comments:  No additional relevant information.  Plan of Care  Orders:  Orders Placed This Encounter  Procedures   Lumbar Epidural Injection    Scheduling Instructions:     Procedure: Interlaminar LESI L2-3     Laterality: Left     Sedation: Patient's choice     Timeframe: Today    Order Specific Question:   Where will this procedure be performed?    Answer:   [facility]   Lumbar Epidural Injection    Standing Status:   Future    Standing Expiration Date:   12/22/2021    Scheduling Instructions:     Procedure: Interlaminar Lumbar  Epidural Steroid injection (LESI)  L2-3     Laterality: Left-sided     Sedation: Patient's choice.     Timeframe: 2 weeks from now    Order Specific Question:  Where will this procedure be performed?    Answer:   [facility]   DG PAIN CLINIC C-ARM 1-60 MIN NO REPORT    Intraoperative interpretation by procedural physician at [place].    Standing Status:   Standing    Number of Occurrences:   1    Order Specific Question:   Reason for exam:    Answer:   Assistance in needle guidance and placement for procedures requiring needle placement in or near specific anatomical locations not easily accessible without such assistance.   Informed Consent Details: Physician/Practitioner Attestation; Transcribe to consent form and obtain patient signature    Note: Always confirm laterality of pain with Ms. Alwyn Ren, before procedure. Transcribe to consent form and obtain patient signature.    Order Specific Question:   Physician/Practitioner attestation of informed consent for procedure/surgical case    Answer:   I, the physician/practitioner, attest that I have discussed with the patient the benefits, risks, side effects, alternatives, likelihood of achieving goals and potential problems during recovery for the procedure that I have provided informed consent.    Order Specific Question:   Procedure    Answer:   Lumbar epidural steroid injection under fluoroscopic guidance    Order Specific Question:   Physician/Practitioner performing the procedure    Answer:   Jari Dipasquale A. Dossie Arbour, MD    Order Specific Question:   Indication/Reason    Answer:   Low back and/or lower extremity pain secondary to lumbar radiculitis   Provide equipment / supplies at bedside    "Epidural Tray" (Disposable  single use) Catheter: NOT required    Standing Status:   Standing    Number of Occurrences:   1    Order Specific Question:   Specify    Answer:   Epidural Tray    Chronic Opioid Analgesic:   Hydrocodone/APAP 5/325 one tablet every 8 hours (15 mg/day of hydrocodone) MME/day: 15 mg/day.   Medications ordered for procedure: Meds ordered this encounter  Medications   iohexol (OMNIPAQUE) 180 MG/ML injection 10 mL    Must be Myelogram-compatible. If not available, you may substitute with a water-soluble, non-ionic, hypoallergenic, myelogram-compatible radiological contrast medium.   lidocaine (XYLOCAINE) 2 % (with pres) injection 400 mg   pentafluoroprop-tetrafluoroeth (GEBAUERS) aerosol   DISCONTD: lactated ringers infusion 1,000 mL   DISCONTD: midazolam (VERSED) 5 MG/5ML injection 0.5-2 mg    Make sure Flumazenil is available in the pyxis when using this medication. If oversedation occurs, administer 0.2 mg IV over 15 sec. If after 45 sec no response, administer 0.2 mg again over 1 min; may repeat at 1 min intervals; not to exceed 4 doses (1 mg)   sodium chloride flush (NS) 0.9 % injection 2 mL   ropivacaine (PF) 2 mg/mL (0.2%) (NAROPIN) injection 2 mL   triamcinolone acetonide (KENALOG-40) injection 40 mg    Medications administered: We administered iohexol, lidocaine, pentafluoroprop-tetrafluoroeth, sodium chloride flush, ropivacaine (PF) 2 mg/mL (0.2%), and triamcinolone acetonide.  See the medical record for exact dosing, route, and time of administration.  Follow-up plan:   Return in about 2 weeks (around 10/07/2021) for [facility]) procedure: (L) L2-3 LESI #3.     Interventional Therapies  Risk  Complexity Considerations:   Estimated body mass index is 32.01 kg/m as calculated from the following:   Height as of this encounter: _0  (1.575 m).   Weight as of this encounter: 175 lb (79.4 kg). NOTE: NO further Lumbar RFA until BMI <35.  Planned  Pending:   Pending further evaluation   Under consideration:   Palliative left L2-3 LESI #2    Completed:   Palliative bilateral lumbar facet block x4 (02/12/2019)  Therapeutic left L2-3 LESI x1 (09/10/2019)   Diagnostic/palliative left L4 TFESI x1  Diagnostic/palliative left L4-5 LESI x2  Diagnostic/palliative right L4 TFESI x1  Diagnostic/palliative right L4-5 LESI x1  Palliative left lumbar facet RFA x1 (06/27/2017)  Palliative right lumbar facet RFA x1 (10/03/2017)    Therapeutic  Palliative (PRN) options:   Palliative bilateral lumbar facet block #4  Diagnostic/palliative left L4 TFESI #2  Diagnostic/palliative left L4-5 LESI #3  Diagnostic/palliative right L4 TFESI #2  Diagnostic/palliative right L4-5 LESI #2  Palliative left lumbar facet RFA #2  Palliative right lumbar facet RFA #2      Recent Visits Date Type Provider Dept  09/14/21 Office Visit Milinda Pointer, MD [facility]  08/31/21 Procedure visit Milinda Pointer, MD [facility]  07/28/21 Office Visit Milinda Pointer, MD [facility]  Showing recent visits within past 90 days and meeting all other requirements Today's Visits Date Type Provider Dept  09/23/21 Procedure visit Milinda Pointer, MD [facility]  Showing today's visits and meeting all other requirements Future Appointments Date Type Provider Dept  10/27/21 Appointment Milinda Pointer, MD [facility]  Showing future appointments within next 90 days and meeting all other requirements Disposition: Discharge home  Discharge (Date  Time): 09/23/2021; 1200 hrs.   Primary Care Physician: Pcp, No Location: [facility] Note by: Gaspar Cola, MD Date: 09/23/2021; Time: 1:16 PM  Disclaimer:  Medicine is not an exact science. The only guarantee in medicine is that nothing is guaranteed. It is important to note that the decision to proceed with this intervention was based on the information collected from the patient. The Data and conclusions were drawn from the patient's questionnaire, the interview, and the physical examination. Because the information was provided in  large part by the patient, it cannot be guaranteed that it has not been purposely or unconsciously manipulated. Every effort has been made to obtain as much relevant data as possible for this evaluation. It is important to note that the conclusions that lead to this procedure are derived in large part from the available data. Always take into account that the treatment will also be dependent on availability of resources and existing treatment guidelines, considered by other Pain Management Practitioners as being common knowledge and practice, at the time of the intervention. For Medico-Legal purposes, it is also important to point out that variation in procedural techniques and pharmacological choices are the acceptable norm. The indications, contraindications, technique, and results of the above procedure should only be interpreted and judged by a Board-Certified Interventional Pain Specialist with extensive familiarity and expertise in the same exact procedure and technique.

## 2021-09-24 ENCOUNTER — Ambulatory Visit: Payer: Self-pay

## 2021-09-24 ENCOUNTER — Telehealth: Payer: Self-pay

## 2021-09-24 ENCOUNTER — Telehealth: Payer: Medicare HMO

## 2021-09-24 DIAGNOSIS — I5032 Chronic diastolic (congestive) heart failure: Secondary | ICD-10-CM

## 2021-09-24 NOTE — Telephone Encounter (Signed)
  Care Management   Follow Up Note   09/24/2021 Name: Eartha Vonbehren MRN: [id number] DOB: Sep 24, 1956    Reason for referral : Chronic Care Management   An unsuccessful telephone outreach was attempted today. The patient was referred to the case management team for assistance with care management and care coordination.    Follow Up Plan:  A HIPAA compliant voice message was left today requesting a return call.   [facility] [phone]

## 2021-09-24 NOTE — Telephone Encounter (Signed)
Post procedure phone call.  LM

## 2021-09-24 NOTE — Chronic Care Management (AMB) (Signed)
  Chronic Care Management   CCM RN Visit Note  09/24/2021 Name: Theresa [name] MRN: [id number] DOB: 1956-05-07  Subjective: Theresa [name] is a 65 y.o. year old female. The care management team was consulted for assistance with disease management and care coordination needs.    Theresa [name] returned the call today. She was unable to complete the telephonic outreach earlier today as anticipated. Will follow up within the next week to confirm availability and reschedule the initial assessment.   [facility] [phone]

## 2021-09-25 ENCOUNTER — Other Ambulatory Visit: Payer: Self-pay | Admitting: Family Medicine

## 2021-09-25 ENCOUNTER — Other Ambulatory Visit: Payer: Self-pay | Admitting: Obstetrics and Gynecology

## 2021-09-25 NOTE — Telephone Encounter (Signed)
dc'd 08/12/21 "stop taking at discharge" Dr Val Riles Requested Prescriptions  Refused Prescriptions Disp Refills  . digoxin (LANOXIN) 0.25 MG tablet [Pharmacy Med Name: DIGOXIN 250 MCG TAB] 90 tablet     Sig: TAKE 1 TABLET BY MOUTH ONCE DAILY     Cardiovascular:  Antiarrhythmic Agents - digoxin Passed - 09/25/2021  6:00 PM      Passed - Cr in normal range and within 180 days    Creatinine  Date Value Ref Range Status  02/02/2015 0.70 mg/dL Final    Comment:    0.44-1.00 NOTE: New Reference Range  12/23/14    Creatinine, Ser  Date Value Ref Range Status  08/20/2021 0.76 0.57 - 1.00 mg/dL Final         Passed - Digoxin (serum) in normal range and within 180 days    Digoxin Level  Date Value Ref Range Status  08/07/2021 1.1 0.8 - 2.0 ng/mL Final    Comment:    Performed at [facility], [address] [place], [place] [address]         [place] in normal range and within 180 days    Calcium  Date Value Ref Range Status  08/20/2021 9.5 8.7 - 10.3 mg/dL Final   Calcium, Total  Date Value Ref Range Status  02/02/2015 8.7 (L) mg/dL Final    Comment:    8.9-10.3 NOTE: New Reference Range  12/23/14    Calcium, Ion  Date Value Ref Range Status  10/11/2019 1.16 1.15 - 1.40 mmol/L Final         Passed - K in normal range and within 180 days    Potassium  Date Value Ref Range Status  08/20/2021 4.2 3.5 - 5.2 mmol/L Final  02/02/2015 3.9 mmol/L Final    Comment:    3.5-5.1 NOTE: New Reference Range  12/23/14          Passed - Last Heart Rate in normal range    Pulse Readings from Last 1 Encounters:  09/23/21 92         Passed - Valid encounter within last 6 months    Recent Outpatient Visits          3 weeks ago Need for immunization against influenza   [facility] Birdie Sons, MD   1 month ago COPD exacerbation [facility])   [facility] Birdie Sons, MD   3 months ago Essential (primary) hypertension    [facility], Vickki Muff, PA-C   1 year ago COPD exacerbation [facility])   [place], Vickki Muff, PA-C   1 year ago Chronic obstructive pulmonary disease with acute exacerbation [facility])   [place], Vickki Muff, PA-C      Future Appointments            In 1 month Fisher, Kirstie Peri, MD [facility] [place]

## 2021-09-29 DIAGNOSIS — M349 Systemic sclerosis, unspecified: Secondary | ICD-10-CM | POA: Diagnosis not present

## 2021-09-29 DIAGNOSIS — J449 Chronic obstructive pulmonary disease, unspecified: Secondary | ICD-10-CM | POA: Diagnosis not present

## 2021-09-29 DIAGNOSIS — R0609 Other forms of dyspnea: Secondary | ICD-10-CM | POA: Diagnosis not present

## 2021-09-29 DIAGNOSIS — I2729 Other secondary pulmonary hypertension: Secondary | ICD-10-CM | POA: Diagnosis not present

## 2021-09-29 DIAGNOSIS — F17218 Nicotine dependence, cigarettes, with other nicotine-induced disorders: Secondary | ICD-10-CM | POA: Diagnosis not present

## 2021-10-03 ENCOUNTER — Other Ambulatory Visit: Payer: Self-pay | Admitting: Pain Medicine

## 2021-10-03 DIAGNOSIS — M25551 Pain in right hip: Secondary | ICD-10-CM

## 2021-10-03 DIAGNOSIS — G8929 Other chronic pain: Secondary | ICD-10-CM

## 2021-10-03 DIAGNOSIS — M47816 Spondylosis without myelopathy or radiculopathy, lumbar region: Secondary | ICD-10-CM

## 2021-10-03 DIAGNOSIS — Z79891 Long term (current) use of opiate analgesic: Secondary | ICD-10-CM

## 2021-10-03 DIAGNOSIS — G894 Chronic pain syndrome: Secondary | ICD-10-CM

## 2021-10-03 DIAGNOSIS — Z79899 Other long term (current) drug therapy: Secondary | ICD-10-CM

## 2021-10-03 DIAGNOSIS — M51369 Other intervertebral disc degeneration, lumbar region without mention of lumbar back pain or lower extremity pain: Secondary | ICD-10-CM

## 2021-10-03 DIAGNOSIS — M5136 Other intervertebral disc degeneration, lumbar region: Secondary | ICD-10-CM

## 2021-10-03 DIAGNOSIS — M5416 Radiculopathy, lumbar region: Secondary | ICD-10-CM

## 2021-10-05 NOTE — Progress Notes (Deleted)
PROVIDER NOTE: Information contained herein reflects review and annotations entered in association with encounter. Interpretation of such information and data should be left to medically-trained personnel. Information provided to patient can be located elsewhere in the medical record under "Patient Instructions". Document created using STT-dictation technology, any transcriptional errors that may result from process are unintentional.    Patient: Theresa [name]  Service Category: Procedure Provider: Gaspar Cola, MD DOB: May 14, 1956 DOS: 10/07/2021 Location: [facility] MRN: [id number] Setting: Ambulatory - outpatient Referring Provider: Milinda Pointer, MD Type: Established Patient Specialty: Interventional Pain Management PCP: Birdie Sons, MD  Primary Reason for Visit: Interventional Pain Management Treatment. CC: No chief complaint on file.   Procedure:          Anesthesia, Analgesia, Anxiolysis:  Type: Therapeutic Inter-Laminar Epidural Steroid Injection  #3  Region: Lumbar Level: L2-3 Level. Laterality: Left Paramedial  Anesthesia: Local (1-2% Lidocaine)  Anxiolysis: None  Sedation: None  Guidance: Fluoroscopy           Position: Prone with head of the table was raised to facilitate breathing.   Indications: No diagnosis found. Pain Score: Pre-procedure:  /10 Post-procedure:  /10    Pre-op H&P Assessment:  Theresa [name] is a 65 y.o. (year old), female patient, seen today for interventional treatment. She  has a past surgical history that includes Abdominal hysterectomy; Tonsillectomy; Carpal tunnel release (Left, 2012); Tubal ligation; epigastric hernia repair (N/A, 08/13/2018); Umbilical hernia repair (N/A, 08/13/2018); and RIGHT/LEFT HEART CATH AND CORONARY ANGIOGRAPHY (N/A, 07/11/2019). Theresa [name] has a current medication list which includes the following prescription(s): albuterol, aspirin ec, cholecalciferol, fluticasone-umeclidin-vilant,  ipratropium-albuterol, levothyroxine, oxygen-helium, potassium chloride sa, daliresp, sertraline, simvastatin, and torsemide. Her primarily concern today is the No chief complaint on file.  Initial Vital Signs:  Pulse/HCG Rate:    Temp:   Resp:   BP:   SpO2:    BMI: Estimated body mass index is 32.37 kg/m as calculated from the following:   Height as of 09/23/21: 5' 2" (1.575 m).   Weight as of 09/23/21: 177 lb (80.3 kg).  Risk Assessment: Allergies: Reviewed. She is allergic to codeine and meloxicam.  Allergy Precautions: None required Coagulopathies: Reviewed. None identified.  Blood-thinner therapy: None at this time Active Infection(s): Reviewed. None identified. Theresa [name] is afebrile  Site Confirmation: Theresa [name] was asked to confirm the procedure and laterality before marking the site Procedure checklist: Completed Consent: Before the procedure and under the influence of no sedative(s), amnesic(s), or anxiolytics, the patient was informed of the treatment options, risks and possible complications. To fulfill our ethical and legal obligations, as recommended by the American Medical Association's Code of Ethics, I have informed the patient of my clinical impression; the nature and purpose of the treatment or procedure; the risks, benefits, and possible complications of the intervention; the alternatives, including doing nothing; the risk(s) and benefit(s) of the alternative treatment(s) or procedure(s); and the risk(s) and benefit(s) of doing nothing. The patient was provided information about the general risks and possible complications associated with the procedure. These may include, but are not limited to: failure to achieve desired goals, infection, bleeding, organ or nerve damage, allergic reactions, paralysis, and death. In addition, the patient was informed of those risks and complications associated to Spine-related procedures, such as failure to decrease pain; infection  (i.e.: Meningitis, epidural or intraspinal abscess); bleeding (i.e.: epidural hematoma, subarachnoid hemorrhage, or any other type of intraspinal or peri-dural bleeding); organ or nerve damage (i.e.: Any type of peripheral  nerve, nerve root, or spinal cord injury) with subsequent damage to sensory, motor, and/or autonomic systems, resulting in permanent pain, numbness, and/or weakness of one or several areas of the body; allergic reactions; (i.e.: anaphylactic reaction); and/or death. Furthermore, the patient was informed of those risks and complications associated with the medications. These include, but are not limited to: allergic reactions (i.e.: anaphylactic or anaphylactoid reaction(s)); adrenal axis suppression; blood sugar elevation that in diabetics may result in ketoacidosis or comma; water retention that in patients with history of congestive heart failure may result in shortness of breath, pulmonary edema, and decompensation with resultant heart failure; weight gain; swelling or edema; medication-induced neural toxicity; particulate matter embolism and blood vessel occlusion with resultant organ, and/or nervous system infarction; and/or aseptic necrosis of one or more joints. Finally, the patient was informed that Medicine is not an exact science; therefore, there is also the possibility of unforeseen or unpredictable risks and/or possible complications that may result in a catastrophic outcome. The patient indicated having understood very clearly. We have given the patient no guarantees and we have made no promises. Enough time was given to the patient to ask questions, all of which were answered to the patient's satisfaction. Theresa [name] has indicated that she wanted to continue with the procedure. Attestation: I, the ordering provider, attest that I have discussed with the patient the benefits, risks, side-effects, alternatives, likelihood of achieving goals, and potential problems during recovery  for the procedure that I have provided informed consent. Date   Time: {CHL [facility]-PAIN TIME CHOICES:21018001}  Pre-Procedure Preparation:  Monitoring: As per clinic protocol. Respiration, ETCO2, SpO2, BP, heart rate and rhythm monitor placed and checked for adequate function Safety Precautions: Patient was assessed for positional comfort and pressure points before starting the procedure. Time-out: I initiated and conducted the "Time-out" before starting the procedure, as per protocol. The patient was asked to participate by confirming the accuracy of the "Time Out" information. Verification of the correct person, site, and procedure were performed and confirmed by me, the nursing staff, and the patient. "Time-out" conducted as per Joint Commission's Universal Protocol (UP.01.01.01). Time:    Description of Procedure:          Target Area: The interlaminar space, initially targeting the lower laminar border of the superior vertebral body. Approach: Paramedial approach. Area Prepped: Entire Posterior Lumbar Region DuraPrep (Iodine Povacrylex [0.7% available iodine] and Isopropyl Alcohol, 74% w/w) Safety Precautions: Aspiration looking for blood return was conducted prior to all injections. At no point did we inject any substances, as a needle was being advanced. No attempts were made at seeking any paresthesias. Safe injection practices and needle disposal techniques used. Medications properly checked for expiration dates. SDV (single dose vial) medications used. Description of the Procedure: Protocol guidelines were followed. The procedure needle was introduced through the skin, ipsilateral to the reported pain, and advanced to the target area. Bone was contacted and the needle walked caudad, until the lamina was cleared. The epidural space was identified using loss-of-resistance technique with 2-3 ml of PF-NaCl (0.9% NSS), in a 5cc LOR glass syringe.  There were no vitals filed for this  visit.  Start Time:   hrs. End Time:   hrs.  Materials:  Needle(s) Type: Epidural needle Gauge: 17G Length: 3.5-in Medication(s): Please see orders for medications and dosing details.  Imaging Guidance (Spinal):          Type of Imaging Technique: Fluoroscopy Guidance (Spinal) Indication(s): Assistance in needle guidance and placement for procedures  requiring needle placement in or near specific anatomical locations not easily accessible without such assistance. Exposure Time: Please see nurses notes. Contrast: Before injecting any contrast, we confirmed that the patient did not have an allergy to iodine, shellfish, or radiological contrast. Once satisfactory needle placement was completed at the desired level, radiological contrast was injected. Contrast injected under live fluoroscopy. No contrast complications. See chart for type and volume of contrast used. Fluoroscopic Guidance: I was personally present during the use of fluoroscopy. "Tunnel Vision Technique" used to obtain the best possible view of the target area. Parallax error corrected before commencing the procedure. "Direction-depth-direction" technique used to introduce the needle under continuous pulsed fluoroscopy. Once target was reached, antero-posterior, oblique, and lateral fluoroscopic projection used confirm needle placement in all planes. Images permanently stored in EMR. Interpretation: I personally interpreted the imaging intraoperatively. Adequate needle placement confirmed in multiple planes. Appropriate spread of contrast into desired area was observed. No evidence of afferent or efferent intravascular uptake. No intrathecal or subarachnoid spread observed. Permanent images saved into the patient's record.  Antibiotic Prophylaxis:   Anti-infectives (From admission, onward)    None      Indication(s): None identified  Post-operative Assessment:  Post-procedure Vital Signs:  Pulse/HCG Rate:    Temp:   Resp:    BP:   SpO2:    EBL: None  Complications: No immediate post-treatment complications observed by team, or reported by patient.  Note: The patient tolerated the entire procedure well. A repeat set of vitals were taken after the procedure and the patient was kept under observation following institutional policy, for this type of procedure. Post-procedural neurological assessment was performed, showing return to baseline, prior to discharge. The patient was provided with post-procedure discharge instructions, including a section on how to identify potential problems. Should any problems arise concerning this procedure, the patient was given instructions to immediately contact us, at any time, without hesitation. In any case, we plan to contact the patient by telephone for a follow-up status report regarding this interventional procedure.  Comments:  No additional relevant information.  Plan of Care  Orders:  No orders of the defined types were placed in this encounter.  Chronic Opioid Analgesic:  Hydrocodone/APAP 5/325 one tablet every 8 hours (15 mg/day of hydrocodone) MME/day: 15 mg/day.   Medications ordered for procedure: No orders of the defined types were placed in this encounter.  Medications administered: Theresa [name] had no medications administered during this visit.  See the medical record for exact dosing, route, and time of administration.  Follow-up plan:   No follow-ups on file.       Interventional Therapies  Risk   Complexity Considerations:   Estimated body mass index is 32.01 kg/m as calculated from the following:   Height as of this encounter: _0  (1.575 m).   Weight as of this encounter: 175 lb (79.4 kg). NOTE: NO further Lumbar RFA until BMI <35.   Planned   Pending:   Pending further evaluation   Under consideration:   Palliative left L2-3 LESI #2    Completed:   Palliative bilateral lumbar facet block x4 (02/12/2019)  Therapeutic left L2-3 LESI x1  (09/10/2019)  Diagnostic/palliative left L4 TFESI x1  Diagnostic/palliative left L4-5 LESI x2  Diagnostic/palliative right L4 TFESI x1  Diagnostic/palliative right L4-5 LESI x1  Palliative left lumbar facet RFA x1 (06/27/2017)  Palliative right lumbar facet RFA x1 (10/03/2017)    Therapeutic   Palliative (PRN) options:   Palliative bilateral lumbar facet  block #4  Diagnostic/palliative left L4 TFESI #2  Diagnostic/palliative left L4-5 LESI #3  Diagnostic/palliative right L4 TFESI #2  Diagnostic/palliative right L4-5 LESI #2  Palliative left lumbar facet RFA #2  Palliative right lumbar facet RFA #2       Recent Visits Date Type Provider Dept  09/23/21 Procedure visit Milinda Pointer, MD [facility]  09/14/21 Office Visit Milinda Pointer, MD [facility]  08/31/21 Procedure visit Milinda Pointer, MD [facility]  07/28/21 Office Visit Milinda Pointer, MD [facility]  Showing recent visits within past 90 days and meeting all other requirements Future Appointments Date Type Provider Dept  10/07/21 Appointment Milinda Pointer, [facility]  10/27/21 Appointment Milinda Pointer, MD [facility]  Showing future appointments within next 90 days and meeting all other requirements  Disposition: Discharge home  Discharge (Date   Time): 10/07/2021;   hrs.   Primary Care Physician: Birdie Sons, MD Location: [facility] Note by: Gaspar Cola, MD Date: 10/07/2021; Time: 12:34 PM  Disclaimer:  Medicine is not an Chief Strategy Officer. The only guarantee in medicine is that nothing is guaranteed. It is important to note that the decision to proceed with this intervention was based on the information collected from the patient. The Data and conclusions were drawn from the patient's questionnaire, the interview, and the physical examination. Because the information was provided in large  part by the patient, it cannot be guaranteed that it has not been purposely or unconsciously manipulated. Every effort has been made to obtain as much relevant data as possible for this evaluation. It is important to note that the conclusions that lead to this procedure are derived in large part from the available data. Always take into account that the treatment will also be dependent on availability of resources and existing treatment guidelines, considered by other Pain Management Practitioners as being common knowledge and practice, at the time of the intervention. For Medico-Legal purposes, it is also important to point out that variation in procedural techniques and pharmacological choices are the acceptable norm. The indications, contraindications, technique, and results of the above procedure should only be interpreted and judged by a Board-Certified Interventional Pain Specialist with extensive familiarity and expertise in the same exact procedure and technique.

## 2021-10-07 ENCOUNTER — Ambulatory Visit: Payer: Medicare HMO | Admitting: Pain Medicine

## 2021-10-07 DIAGNOSIS — F411 Generalized anxiety disorder: Secondary | ICD-10-CM | POA: Diagnosis not present

## 2021-10-07 DIAGNOSIS — I110 Hypertensive heart disease with heart failure: Secondary | ICD-10-CM | POA: Diagnosis not present

## 2021-10-07 DIAGNOSIS — J9621 Acute and chronic respiratory failure with hypoxia: Secondary | ICD-10-CM | POA: Diagnosis not present

## 2021-10-07 DIAGNOSIS — I5032 Chronic diastolic (congestive) heart failure: Secondary | ICD-10-CM | POA: Diagnosis not present

## 2021-10-07 DIAGNOSIS — J441 Chronic obstructive pulmonary disease with (acute) exacerbation: Secondary | ICD-10-CM | POA: Diagnosis not present

## 2021-10-07 DIAGNOSIS — F32A Depression, unspecified: Secondary | ICD-10-CM | POA: Diagnosis not present

## 2021-10-07 DIAGNOSIS — G4730 Sleep apnea, unspecified: Secondary | ICD-10-CM | POA: Diagnosis not present

## 2021-10-07 DIAGNOSIS — I272 Pulmonary hypertension, unspecified: Secondary | ICD-10-CM | POA: Diagnosis not present

## 2021-10-07 DIAGNOSIS — E039 Hypothyroidism, unspecified: Secondary | ICD-10-CM | POA: Diagnosis not present

## 2021-10-08 DIAGNOSIS — J449 Chronic obstructive pulmonary disease, unspecified: Secondary | ICD-10-CM | POA: Diagnosis not present

## 2021-10-11 NOTE — Progress Notes (Deleted)
No-show to reschedule appointment.

## 2021-10-12 ENCOUNTER — Ambulatory Visit: Payer: Medicare HMO | Attending: Pain Medicine | Admitting: Pain Medicine

## 2021-10-22 DIAGNOSIS — J9610 Chronic respiratory failure, unspecified whether with hypoxia or hypercapnia: Secondary | ICD-10-CM | POA: Diagnosis not present

## 2021-10-26 NOTE — Progress Notes (Signed)
PROVIDER NOTE: Information contained herein reflects review and annotations entered in association with encounter. Interpretation of such information and data should be left to medically-trained personnel. Information provided to patient can be located elsewhere in the medical record under "Patient Instructions". Document created using STT-dictation technology, any transcriptional errors that may result from process are unintentional.    Patient: Theresa [name]  Service Category: E/M  Provider: Gaspar Cola, MD  DOB: 29-Nov-1955  DOS: 10/27/2021  Specialty: Interventional Pain Management  MRN: [id number]  Setting: Ambulatory outpatient  PCP: Birdie Sons, MD  Type: Established Patient    Referring Provider: No ref. provider found  Location: Office  Delivery: Face-to-face     HPI  [name], a 66 y.o. year old female, is here today because of her Chronic bilateral low back pain without sciatica [M54.50, G89.29]. [name] primary complain today is Back Pain (Lumbar bilateral ) Last encounter: My last encounter with her was on 10/12/2021. Pertinent problems: [name] has Tendinitis; Neuritis or radiculitis due to rupture of lumbar intervertebral disc; Decreased motor strength; DDD (degenerative disc disease), lumbar; Carpal tunnel syndrome; Neurogenic pain; Neuropathic pain; Musculoskeletal pain; Lumbar spondylosis (L4-5); Chronic low back pain (1ry area of Pain) (Bilateral) (L>R) w/o sciatica; Chronic lower extremity pain (2ry area of Pain) (Left); Chronic lumbar radicular pain (L4 & S1 Dermatome) (Left); Chronic shoulder pain (Right side); Chronic upper extremity pain (Right-sided); Cervical radiculitis (Right side); Abnormal x-ray of lumbar spine; Chronic pain syndrome (significant psychosocial component); 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; 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); Lumbar facet syndrome (Bilateral) (L>R); Chronic sacroiliac joint pain  (Bilateral); Lumbar facet hypertrophy (multilevel); Lumbar spinal stenosis (L4-5); 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); 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); Lumbosacral radiculopathy at L4; Chronic shoulder pain (Left); Arthralgia of acromioclavicular joint (Right); Acromioclavicular joint DJD (Right); Osteoarthritis of shoulder (Right); Chronic hip pain (3ry area of Pain) (Bilateral) (L>R); Chronic lower extremity pain (Bilateral) (L>R); Cervicalgia (Bilateral) (R>L); Spondylosis without myelopathy or radiculopathy, lumbosacral region; Chronic knee pain (Left); Chronic left-sided lumbar radiculopathy; Abnormal MRI, lumbar spine (07/23/2019); 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 Pain Assessment: Severity of Chronic pain is reported as a 6 /10. Location: Back Lower, Right, Left/into right hip and down right leg. Onset: More than a month ago. Quality: Discomfort, Constant, Dull, Aching. Timing: Constant. Modifying factor(s): hot tub, salon pas, medications take the edge off. Vitals:  height is _0  (1.575 m) and weight is 176 lb (79.8 kg). Her temporal temperature is 97.6 F (36.4 C). Her blood pressure is 114/81 and her pulse is 114 (abnormal). Her respiration is 22 (abnormal) and oxygen saturation is 90%.   Reason for encounter: medication management. UDS ordered today.   The patient indicates doing well with the current medication regimen. No adverse reactions or side effects reported to the medications.   The patient indicates that she is having more pain in the lower back with the right side being worse than the left.  This pain seems to radiate to the buttocks area and through the back of the leg on the right side to the level of the knee.  Pain is nonradicular and seems  to be referred from the lumbar facets.  Hyperextension or rotation of the lumbar spine and the Kemp maneuver were positive today for  bilateral lumbar facet arthralgia.  The challenge with this case is that she has both, facet joint disease as well as a large left subarticular disc extrusion that extends inferiorly into the left L3-4 neuroforamen with mass-effect on the descending left sided nerve roots.  This has improved significantly since we did the L2-3 lumbar epidural steroid injections but at this point the pain that she is experiencing is more on the right side and not going into the groin area or following an L2 or L3 dermatomal distribution.  This tells me that were dealing with now is the lumbar facet arthropathy.  For this reason, I will be scheduling her to return for a bilateral lumbar facet block under fluoroscopic guidance.  Should she do well, we will plan on repeating her radiofrequency ablation which was last done in 2018.  RTCB: 01/31/2022 Nonopioids transferred 08/03/2020: Gabapentin and baclofen  Pharmacotherapy Assessment  Analgesic: Hydrocodone/APAP 5/325 one tablet every 8 hours (15 mg/day of hydrocodone) MME/day: 15 mg/day.   Monitoring: [place] PMP: PDMP reviewed during this encounter.       Pharmacotherapy: No side-effects or adverse reactions reported. Compliance: No problems identified. Effectiveness: Clinically acceptable.  Janett Billow, RN  10/27/2021  2:58 PM  Sign when Signing Visit Nursing Pain Medication Assessment:  Safety precautions to be maintained throughout the outpatient stay will include: orient to surroundings, keep bed in low position, maintain call bell within reach at all times, provide assistance with transfer out of bed and ambulation.  Medication Inspection Compliance: Pill count conducted under aseptic conditions, in front of the patient. Neither the pills nor the bottle was removed from the patient's sight at any time. Once count was completed  pills were immediately returned to the patient in their original bottle.  Medication: Hydrocodone/APAP Pill/Patch Count:  22 of 90 pills remain Pill/Patch Appearance: Markings consistent with prescribed medication Bottle Appearance: Standard pharmacy container. Clearly labeled. Filled Date: 31 / 18 / 2022 Last Medication intake:  Today    UDS:  Summary  Date Value Ref Range Status  05/05/2021 Note  Final    Comment:    ==================================================================== [employer]ox[employer] Select 13 (MW) ==================================================================== Specimen Alert Note: Urinary creatinine is low; ability to detect some drugs may be compromised. Interpret results with caution. (Creatinine) ==================================================================== Test                             Result       Flag       Units  Drug Present and Declared for Prescription Verification   Hydrocodone                    1236         EXPECTED   ng/mg creat   Norhydrocodone                 986          EXPECTED   ng/mg creat    Sources of hydrocodone include scheduled prescription medications.    Norhydrocodone is an expected metabolite of hydrocodone.  Drug Absent but Declared for Prescription Verification   Diazepam                       Not Detected UNEXPECTED ng/mg creat ==================================================================== Test                      Result    Flag  Units      Ref Range   Creatinine              14        LL     mg/dL      >=20 ==================================================================== Declared Medications:  The flagging and interpretation on this report are based on the  following declared medications.  Unexpected results may arise from  inaccuracies in the declared medications.   **Note: The testing scope of this panel includes these medications:   Diazepam (Valium)  Hydrocodone (Norco)   **Note: The testing  scope of this panel does not include the  following reported medications:   Acetaminophen (Norco)  Albuterol (Ventolin HFA)  Aspirin  Baclofen (Lioresal)  Cholecalciferol  Digoxin (Lanoxin)  Fluticasone  Gabapentin (Neurontin)  Umeclidinium  Vilanterol ==================================================================== For clinical consultation, please call [phone]. ====================================================================      ROS  Constitutional: Denies any fever or chills Gastrointestinal: No reported hemesis, hematochezia, vomiting, or acute GI distress Musculoskeletal: Denies any acute onset joint swelling, redness, loss of ROM, or weakness Neurological: No reported episodes of acute onset apraxia, aphasia, dysarthria, agnosia, amnesia, paralysis, loss of coordination, or loss of consciousness  Medication Review  Cholecalciferol, Fluticasone-Umeclidin-Vilant, HYDROcodone-acetaminophen, Oxygen-Helium, Roflumilast, albuterol, aspirin EC, ipratropium-albuterol, levothyroxine, potassium chloride SA, sertraline, simvastatin, and torsemide  History Review  Allergy: [name] is allergic to codeine and meloxicam. Drug: [name]  reports no history of drug use. Alcohol:  reports no history of alcohol use. Tobacco:  reports that she has been smoking cigarettes. She has a 22.00 pack-year smoking history. She has never used smokeless tobacco. Social: [name]  reports that she has been smoking cigarettes. She has a 22.00 pack-year smoking history. She has never used smokeless tobacco. She reports that she does not drink alcohol and does not use drugs. Medical:  has a past medical history of Acute postoperative pain (10/03/2017), Anxiety, BiPAP (biphasic positive airway pressure) dependence, Carpal tunnel syndrome, CHF (congestive heart failure) ([place]), CHF (congestive heart failure) ([place]), Chronic generalized abdominal pain, Chronic rhinitis, COPD (chronic  obstructive pulmonary disease) ([place]), DDD (degenerative disc disease), cervical, DDD (degenerative disc disease), lumbosacral, Depression, Dyspnea, Edema, Flu, Gastritis, GERD (gastroesophageal reflux disease), Hematuria, Hernia of abdominal wall (07/17/2018), Hernia, abdominal, Hypertension, Kidney stones, Low back pain, Lumbar radiculitis, Malodorous urine, Muscle weakness, Obesity, Oxygen dependent, Renal cyst, Sensory urge incontinence, Thyroid activity decreased, Tobacco abuse, and Wheezing. Surgical: [name]  has a past surgical history that includes Abdominal hysterectomy; Tonsillectomy; Carpal tunnel release (Left, 2012); Tubal ligation; epigastric hernia repair (N/A, 08/13/2018); Umbilical hernia repair (N/A, 08/13/2018); and RIGHT/LEFT HEART CATH AND CORONARY ANGIOGRAPHY (N/A, 07/11/2019). Family: family history includes Alcohol abuse in her father; Breast cancer in her sister; Cancer in her brother, brother, and sister; Coronary artery disease in her mother; Heart disease in her father and mother; Lung cancer in her sister; Pneumonia in her brother; Stroke in her mother.  Laboratory Chemistry Profile   Renal Lab Results  Component Value Date   BUN 12 08/20/2021   CREATININE 0.76 08/20/2021   BCR 16 08/20/2021   GFRAA >60 06/26/2020   GFRNONAA >60 08/12/2021    Hepatic Lab Results  Component Value Date   AST 26 06/10/2021   ALT 26 06/10/2021   ALBUMIN 4.3 08/20/2021   ALKPHOS 115 06/10/2021   LIPASE 31 10/17/2017   AMMONIA 40 (H) 10/11/2019    Electrolytes Lab Results  Component Value Date   NA 146 (H) 08/20/2021   K 4.2  08/20/2021   CL 101 08/20/2021   CALCIUM 9.5 08/20/2021   MG 2.4 (H) 08/20/2021   PHOS 2.9 (L) 08/20/2021    Bone Lab Results  Component Value Date   25OHVITD1 13 (L) 09/17/2015   25OHVITD2 <1.0 09/17/2015   25OHVITD3 13 09/17/2015    Inflammation (CRP: Acute Phase) (ESR: Chronic Phase) Lab Results  Component Value Date   CRP 4.2 (H) 02/08/2020    ESRSEDRATE 16 08/08/2017   LATICACIDVEN 1.4 02/03/2020         Note: Above Lab results reviewed.  Recent Imaging Review  DG PAIN CLINIC C-ARM 1-60 MIN NO REPORT Fluoro was used, but no Radiologist interpretation will be provided.  Please refer to "NOTES" tab for provider progress note. Note: Reviewed        Physical Exam  General appearance: Well nourished, well developed, and well hydrated. In no apparent acute distress Mental status: Alert, oriented x 3 (person, place, & time)       Respiratory: No evidence of acute respiratory distress Eyes: PERLA Vitals: BP 114/81 (BP Location: Right Arm, Patient Position: Sitting, Cuff Size: Normal)    Pulse (!) 114    Temp 97.6 F (36.4 C) (Temporal)    Resp (!) 22    Ht _0  (1.575 m)    Wt 176 lb (79.8 kg)    SpO2 90% Comment: 2.5 liters   BMI 32.19 kg/m  BMI: Estimated body mass index is 32.19 kg/m as calculated from the following:   Height as of this encounter: _1  (1.575 m).   Weight as of this encounter: 176 lb (79.8 kg). Ideal: Ideal body weight: 50.1 kg (110 lb 7.2 oz) Adjusted ideal body weight: 62 kg (136 lb 10.7 oz)  Assessment   Status Diagnosis  Controlled Controlled Controlled 1. Chronic low back pain (1ry area of Pain) (Bilateral) (L>R) w/o sciatica   2. Lumbar facet syndrome (Bilateral) (L>R)   3. Lumbar facet hypertrophy (multilevel)   4. DDD (degenerative disc disease), lumbar   5. Spondylosis without myelopathy or radiculopathy, lumbosacral region   6. Chronic lower extremity pain (Bilateral) (L>R)   7. Chronic hip pain (3ry area of Pain) (Bilateral) (L>R)   8. Chronic knee pain (Left)   9. Chronic pain syndrome (significant psychosocial component)   10. Pharmacologic therapy   11. Chronic use of opiate for therapeutic purpose   12. Encounter for medication management   13. Encounter for chronic pain management      Updated Problems: No problems updated.  Plan of Care  Problem-specific:  No  problem-specific Assessment & Plan notes found for this encounter.  [name] has a current medication list which includes the following long-term medication(s): albuterol, [START ON 11/02/2021] hydrocodone-acetaminophen, [START ON 12/02/2021] hydrocodone-acetaminophen, [START ON 01/01/2022] hydrocodone-acetaminophen, ipratropium-albuterol, levothyroxine, potassium chloride sa, sertraline, simvastatin, and torsemide.  Pharmacotherapy (Medications Ordered): Meds ordered this encounter  Medications   HYDROcodone-acetaminophen (NORCO/VICODIN) 5-325 MG tablet    Sig: Take 1 tablet by mouth every 8 (eight) hours as needed for severe pain. Must last 30 days    Dispense:  90 tablet    Refill:  0    DO NOT: delete (not duplicate); no partial-fill (will deny script to complete), no refill request (F/U required). DISPENSE: 1 day early if closed on fill date. WARN: No CNS-depressants within 8 hrs of med.   HYDROcodone-acetaminophen (NORCO/VICODIN) 5-325 MG tablet    Sig: Take 1 tablet by mouth every 8 (eight) hours as needed for severe pain.  Must last 30 days    Dispense:  90 tablet    Refill:  0    DO NOT: delete (not duplicate); no partial-fill (will deny script to complete), no refill request (F/U required). DISPENSE: 1 day early if closed on fill date. WARN: No CNS-depressants within 8 hrs of med.   HYDROcodone-acetaminophen (NORCO/VICODIN) 5-325 MG tablet    Sig: Take 1 tablet by mouth every 8 (eight) hours as needed for severe pain. Must last 30 days    Dispense:  90 tablet    Refill:  0    DO NOT: delete (not duplicate); no partial-fill (will deny script to complete), no refill request (F/U required). DISPENSE: 1 day early if closed on fill date. WARN: No CNS-depressants within 8 hrs of med.   Orders:  Orders Placed This Encounter  Procedures   LUMBAR FACET(MEDIAL BRANCH NERVE BLOCK) MBNB    Standing Status:   Future    Standing Expiration Date:   01/25/2022    Scheduling Instructions:      Procedure: Lumbar facet block (AKA.: Lumbosacral medial branch nerve block)     Side: Bilateral     Level: L3-4, L4-5, & L5-S1 Facets (L2, L3, L4, L5, & S1 Medial Branch Nerves)     Sedation: Patient's choice.     Timeframe: ASAA    Order Specific Question:   Where will this procedure be performed?    Answer:   [facility]   ToxASSURE Select 13 (MW), Urine    Volume: 30 ml(s). Minimum 3 ml of urine is needed. Document temperature of fresh sample. Indications: Long term (current) use of opiate analgesic (B14.782)    Order Specific Question:   Release to patient    Answer:   Immediate   Follow-up plan:   Return for (Clinic) procedure: (B) L-FCT BLK, (Sed-anx).     Interventional Therapies  Risk   Complexity Considerations:   Estimated body mass index is 32.01 kg/m as calculated from the following:   Height as of this encounter: _0  (1.575 m).   Weight as of this encounter: 175 lb (79.4 kg). NOTE: NO further Lumbar RFA until BMI <35.   Planned   Pending:   Pending further evaluation   Under consideration:   Palliative left L2-3 LESI #2    Completed:   Palliative bilateral lumbar facet block x4 (02/12/2019)  Therapeutic left L2-3 LESI x1 (09/10/2019)  Diagnostic/palliative left L4 TFESI x1  Diagnostic/palliative left L4-5 LESI x2  Diagnostic/palliative right L4 TFESI x1  Diagnostic/palliative right L4-5 LESI x1  Palliative left lumbar facet RFA x1 (06/27/2017)  Palliative right lumbar facet RFA x1 (10/03/2017)    Therapeutic   Palliative (PRN) options:   Palliative bilateral lumbar facet block #4  Diagnostic/palliative left L4 TFESI #2  Diagnostic/palliative left L4-5 LESI #3  Diagnostic/palliative right L4 TFESI #2  Diagnostic/palliative right L4-5 LESI #2  Palliative left lumbar facet RFA #2  Palliative right lumbar facet RFA #2     Recent Visits Date Type Provider Dept  09/23/21 Procedure visit Milinda Pointer, MD [facility]  09/14/21  Office Visit Milinda Pointer, MD [facility]  08/31/21 Procedure visit Milinda Pointer, MD [facility]  Showing recent visits within past 90 days and meeting all other requirements Today's Visits Date Type Provider Dept  10/27/21 Office Visit Milinda Pointer, MD [facility]  Showing today's visits and meeting all other requirements Future Appointments No visits were found meeting these conditions. Showing future appointments within  next 90 days and meeting all other requirements  I discussed the assessment and treatment plan with the patient. The patient was provided an opportunity to ask questions and all were answered. The patient agreed with the plan and demonstrated an understanding of the instructions.  Patient advised to call back or seek an in-person evaluation if the symptoms or condition worsens.  Duration of encounter: 30 minutes.  Note by: Gaspar Cola, MD Date: 10/27/2021; Time: 3:32 PM

## 2021-10-27 ENCOUNTER — Other Ambulatory Visit: Payer: Self-pay

## 2021-10-27 ENCOUNTER — Ambulatory Visit: Payer: Medicare HMO | Attending: Pain Medicine | Admitting: Pain Medicine

## 2021-10-27 ENCOUNTER — Encounter: Payer: Self-pay | Admitting: Pain Medicine

## 2021-10-27 VITALS — BP 114/81 | HR 114 | Temp 97.6°F | Resp 22 | Ht 62.0 in | Wt 176.0 lb

## 2021-10-27 DIAGNOSIS — M25562 Pain in left knee: Secondary | ICD-10-CM | POA: Diagnosis not present

## 2021-10-27 DIAGNOSIS — M79604 Pain in right leg: Secondary | ICD-10-CM

## 2021-10-27 DIAGNOSIS — M25552 Pain in left hip: Secondary | ICD-10-CM

## 2021-10-27 DIAGNOSIS — G894 Chronic pain syndrome: Secondary | ICD-10-CM | POA: Insufficient documentation

## 2021-10-27 DIAGNOSIS — M47816 Spondylosis without myelopathy or radiculopathy, lumbar region: Secondary | ICD-10-CM | POA: Insufficient documentation

## 2021-10-27 DIAGNOSIS — M25551 Pain in right hip: Secondary | ICD-10-CM | POA: Diagnosis not present

## 2021-10-27 DIAGNOSIS — M5416 Radiculopathy, lumbar region: Secondary | ICD-10-CM

## 2021-10-27 DIAGNOSIS — Z79899 Other long term (current) drug therapy: Secondary | ICD-10-CM

## 2021-10-27 DIAGNOSIS — M47817 Spondylosis without myelopathy or radiculopathy, lumbosacral region: Secondary | ICD-10-CM | POA: Insufficient documentation

## 2021-10-27 DIAGNOSIS — M545 Low back pain, unspecified: Secondary | ICD-10-CM | POA: Diagnosis not present

## 2021-10-27 DIAGNOSIS — M79605 Pain in left leg: Secondary | ICD-10-CM | POA: Insufficient documentation

## 2021-10-27 DIAGNOSIS — G8929 Other chronic pain: Secondary | ICD-10-CM | POA: Insufficient documentation

## 2021-10-27 DIAGNOSIS — M5136 Other intervertebral disc degeneration, lumbar region: Secondary | ICD-10-CM | POA: Insufficient documentation

## 2021-10-27 DIAGNOSIS — Z79891 Long term (current) use of opiate analgesic: Secondary | ICD-10-CM | POA: Diagnosis not present

## 2021-10-27 MED ORDER — HYDROCODONE-ACETAMINOPHEN 5-325 MG PO TABS: 1.0000 | ORAL_TABLET | Freq: Three times a day (TID) | ORAL | 0 refills | Status: DC | PRN

## 2021-10-27 NOTE — Progress Notes (Signed)
Nursing Pain Medication Assessment:  Safety precautions to be maintained throughout the outpatient stay will include: orient to surroundings, keep bed in low position, maintain call bell within reach at all times, provide assistance with transfer out of bed and ambulation.  Medication Inspection Compliance: Pill count conducted under aseptic conditions, in front of the patient. Neither the pills nor the bottle was removed from the patient's sight at any time. Once count was completed pills were immediately returned to the patient in their original bottle.  Medication: Hydrocodone/APAP Pill/Patch Count:  22 of 90 pills remain Pill/Patch Appearance: Markings consistent with prescribed medication Bottle Appearance: Standard pharmacy container. Clearly labeled. Filled Date: 51 / 18 / 2022 Last Medication intake:  Today

## 2021-10-28 ENCOUNTER — Telehealth: Payer: Self-pay | Admitting: Family Medicine

## 2021-10-28 DIAGNOSIS — F411 Generalized anxiety disorder: Secondary | ICD-10-CM

## 2021-10-28 MED ORDER — SERTRALINE HCL 100 MG PO TABS: 200.0000 mg | ORAL_TABLET | Freq: Every morning | ORAL | 1 refills | Status: DC

## 2021-10-28 NOTE — Telephone Encounter (Signed)
[facility] faxed refill request for the following medications:  sertraline (ZOLOFT) 100 MG tablet   Please advise.

## 2021-11-02 ENCOUNTER — Ambulatory Visit: Payer: Medicare HMO | Admitting: Family Medicine

## 2021-11-02 LAB — TOXASSURE SELECT 13 (MW), URINE

## 2021-11-05 ENCOUNTER — Ambulatory Visit: Payer: Medicare HMO | Admitting: Family Medicine

## 2021-11-08 DIAGNOSIS — J449 Chronic obstructive pulmonary disease, unspecified: Secondary | ICD-10-CM | POA: Diagnosis not present

## 2021-11-11 ENCOUNTER — Encounter: Payer: Self-pay | Admitting: Pain Medicine

## 2021-11-11 ENCOUNTER — Other Ambulatory Visit: Payer: Self-pay

## 2021-11-11 ENCOUNTER — Ambulatory Visit (HOSPITAL_BASED_OUTPATIENT_CLINIC_OR_DEPARTMENT_OTHER): Payer: Medicare HMO | Admitting: Pain Medicine

## 2021-11-11 ENCOUNTER — Ambulatory Visit
Admission: RE | Admit: 2021-11-11 | Discharge: 2021-11-11 | Disposition: A | Discharge status: Home / Self Care | Payer: Medicare HMO | Source: Ambulatory Visit | Attending: Pain Medicine | Admitting: Pain Medicine

## 2021-11-11 VITALS — BP 104/72 | HR 82 | Temp 99.0°F | Resp 21 | Ht 62.0 in | Wt 176.0 lb

## 2021-11-11 DIAGNOSIS — M47817 Spondylosis without myelopathy or radiculopathy, lumbosacral region: Secondary | ICD-10-CM | POA: Diagnosis present

## 2021-11-11 DIAGNOSIS — M545 Low back pain, unspecified: Secondary | ICD-10-CM

## 2021-11-11 DIAGNOSIS — G8929 Other chronic pain: Secondary | ICD-10-CM | POA: Insufficient documentation

## 2021-11-11 DIAGNOSIS — R937 Abnormal findings on diagnostic imaging of other parts of musculoskeletal system: Secondary | ICD-10-CM | POA: Insufficient documentation

## 2021-11-11 DIAGNOSIS — M5136 Other intervertebral disc degeneration, lumbar region: Secondary | ICD-10-CM | POA: Diagnosis present

## 2021-11-11 DIAGNOSIS — M47816 Spondylosis without myelopathy or radiculopathy, lumbar region: Secondary | ICD-10-CM | POA: Insufficient documentation

## 2021-11-11 DIAGNOSIS — M51369 Other intervertebral disc degeneration, lumbar region without mention of lumbar back pain or lower extremity pain: Secondary | ICD-10-CM

## 2021-11-11 MED ORDER — ROPIVACAINE HCL 2 MG/ML IJ SOLN
INTRAMUSCULAR | Status: AC
  Filled 2021-11-11: qty 20

## 2021-11-11 MED ORDER — TRIAMCINOLONE ACETONIDE 40 MG/ML IJ SUSP
80.0000 mg | Freq: Once | INTRAMUSCULAR | Status: AC
  Administered 2021-11-11: 80 mg
  Filled 2021-11-11: qty 2

## 2021-11-11 MED ORDER — LIDOCAINE HCL 2 % IJ SOLN
20.0000 mL | Freq: Once | INTRAMUSCULAR | Status: AC
  Administered 2021-11-11: 400 mg
  Filled 2021-11-11: qty 20

## 2021-11-11 MED ORDER — ROPIVACAINE HCL 2 MG/ML IJ SOLN
18.0000 mL | Freq: Once | INTRAMUSCULAR | Status: AC
  Administered 2021-11-11: 18 mL via PERINEURAL

## 2021-11-11 MED ORDER — PENTAFLUOROPROP-TETRAFLUOROETH EX AERO
INHALATION_SPRAY | Freq: Once | CUTANEOUS | Status: DC
  Filled 2021-11-11: qty 116

## 2021-11-11 MED ORDER — LACTATED RINGERS IV SOLN
1000.0000 mL | Freq: Once | INTRAVENOUS | Status: AC
  Administered 2021-11-11: 1000 mL via INTRAVENOUS

## 2021-11-11 MED ORDER — MIDAZOLAM HCL 5 MG/5ML IJ SOLN
0.5000 mg | Freq: Once | INTRAMUSCULAR | Status: AC
  Administered 2021-11-11: 2 mg via INTRAVENOUS
  Filled 2021-11-11: qty 5

## 2021-11-11 NOTE — Patient Instructions (Signed)
____________________________________________________________________________________________  Virtual Visits   What is a "Virtual Visit"? It is a Metallurgist (medical visit) that takes place on real time (NOT TEXT or E-MAIL) over the telephone or computer device (desktop, laptop, tablet, smart phone, etc.). It allows for more location flexibility between the patient and the healthcare provider.  Who decides when these types of visits will be used? The physician.  Who is eligible for these types of visits? Only those patients that can be reliably reached over the telephone.  What do you mean by reliably? We do not have time to call everyone multiple times, therefore those that tend to screen calls and then call back later are not suitable candidates for this system. We understand how people are reluctant to pickup on "unknown" calls, therefore, we suggest adding our telephone numbers to your list of "CONTACT(s)". This way, you should be able to readily identify our calls when you receive one. All of our numbers are available below.   Who is not eligible? This option is not available for medication management encounters, specially for controlled substances. Patients on pain medications that fall under the category of controlled substances have to come in for "Face-to-Face" encounters. This is required for mandatory monitoring of these substances. You may be asked to provide a sample for an unannounced urine drug screening test (UDS), and we will need to count your pain pills. Not bringing your pills to be counted may result in no refill. Obviously, neither one of these can be done over the phone.  When will this type of visits be used? You can request a virtual visit whenever you are physically unable to attend a regular appointment. The decision will be made by the physician (or healthcare provider) on a case by case basis.   At what time will I be called? This is an  excellent question. The providers will try to call you whenever they have time available. Do not expect to be called at any specific time. The secretaries will assign you a time for your virtual visit appointment, but this is done simply to keep a list of those patients that need to be called, but not for the purpose of keeping a time schedule. Be advised that the call may come in anytime during the day, between the hours of 8:00 AM and 8::00 PM, depending on provider availability. We do understand that the system is not perfect. If you are unable to be available that day on a moments notice, then request an "in-person" appointment rather than a "virtual visit".  Can I request my medication visits to be "Virtual"? Yes you may request it, but the decision is entirely up to the healthcare provider. Control substances require specific monitoring that requires Face-to-Face encounters. The number of encounters  and the extent of the monitoring is determined on a case by case basis.  Add a new contact to your smart phone and label it "PAIN CLINIC" Under this contact add the following numbers: Main: [phone] (Official Contact Number) Nurses: [phone] (These are outgoing only calling systems. Do not call this number.) Dr. Dossie Arbour: [phone] or [phone] (Outgoing calls only. Do not call this number.)  ____________________________________________________________________________________________  ____________________________________________________________________________________________  Post-Procedure Discharge Instructions  Instructions: Apply ice:  Purpose: This will minimize any swelling and discomfort after procedure.  When: Day of procedure, as soon as you get home. How: Fill a plastic sandwich bag with crushed ice. Cover it with a small towel and apply to injection  site. How long: (15 min on, 15 min off) Apply for 15 minutes then remove x 15 minutes.  Repeat sequence on day of  procedure, until you go to bed. Apply heat:  Purpose: To treat any soreness and discomfort from the procedure. When: Starting the next day after the procedure. How: Apply heat to procedure site starting the day following the procedure. How long: May continue to repeat daily, until discomfort goes away. Food intake: Start with clear liquids (like water) and advance to regular food, as tolerated.  Physical activities: Keep activities to a minimum for the first 8 hours after the procedure. After that, then as tolerated. Driving: If you have received any sedation, be responsible and do not drive. You are not allowed to drive for 24 hours after having sedation. Blood thinner: (Applies only to those taking blood thinners) You may restart your blood thinner 6 hours after your procedure. Insulin: (Applies only to Diabetic patients taking insulin) As soon as you can eat, you may resume your normal dosing schedule. Infection prevention: Keep procedure site clean and dry. Shower daily and clean area with soap and water. Post-procedure Pain Diary: Extremely important that this be done correctly and accurately. Recorded information will be used to determine the next step in treatment. For the purpose of accuracy, follow these rules: Evaluate only the area treated. Do not report or include pain from an untreated area. For the purpose of this evaluation, ignore all other areas of pain, except for the treated area. After your procedure, avoid taking a long nap and attempting to complete the pain diary after you wake up. Instead, set your alarm clock to go off every hour, on the hour, for the initial 8 hours after the procedure. Document the duration of the numbing medicine, and the relief you are getting from it. Do not go to sleep and attempt to complete it later. It will not be accurate. If you received sedation, it is likely that you were given a medication that may cause amnesia. Because of this, completing the  diary at a later time may cause the information to be inaccurate. This information is needed to plan your care. Follow-up appointment: Keep your post-procedure follow-up evaluation appointment after the procedure (usually 2 weeks for most procedures, 6 weeks for radiofrequencies). DO NOT FORGET to bring you pain diary with you.   Expect: (What should I expect to see with my procedure?) From numbing medicine (AKA: Local Anesthetics): Numbness or decrease in pain. You may also experience some weakness, which if present, could last for the duration of the local anesthetic. Onset: Full effect within 15 minutes of injected. Duration: It will depend on the type of local anesthetic used. On the average, 1 to 8 hours.  From steroids (Applies only if steroids were used): Decrease in swelling or inflammation. Once inflammation is improved, relief of the pain will follow. Onset of benefits: Depends on the amount of swelling present. The more swelling, the longer it will take for the benefits to be seen. In some cases, up to 10 days. Duration: Steroids will stay in the system x 2 weeks. Duration of benefits will depend on multiple posibilities including persistent irritating factors. Side-effects: If present, they may typically last 2 weeks (the duration of the steroids). Frequent: Cramps (if they occur, drink Gatorade and take over-the-counter Magnesium 450-500 mg once to twice a day); water retention with temporary weight gain; increases in blood sugar; decreased immune system response; increased appetite. Occasional: Facial flushing (red, warm  cheeks); mood swings; menstrual changes. Uncommon: Long-term decrease or suppression of natural hormones; bone thinning. (These are more common with higher doses or more frequent use. This is why we prefer that our patients avoid having any injection therapies in other practices.)  Very Rare: Severe mood changes; psychosis; aseptic necrosis. From procedure: Some  discomfort is to be expected once the numbing medicine wears off. This should be minimal if ice and heat are applied as instructed.  Call if: (When should I call?) You experience numbness and weakness that gets worse with time, as opposed to wearing off. New onset bowel or bladder incontinence. (Applies only to procedures done in the spine)  Emergency Numbers: Durning business hours (Monday - Thursday, 8:00 AM - 4:00 PM) (Friday, 9:00 AM - 12:00 Noon): [phone] After hours: [phone] NOTE: If you are having a problem and are unable connect with, or to talk to a provider, then go to your nearest urgent care or emergency department. If the problem is serious and urgent, please call 911. ____________________________________________________________________________________________

## 2021-11-11 NOTE — Progress Notes (Signed)
Safety precautions to be maintained throughout the outpatient stay will include: orient to surroundings, keep bed in low position, maintain call bell within reach at all times, provide assistance with transfer out of bed and ambulation.  

## 2021-11-11 NOTE — Progress Notes (Signed)
PROVIDER NOTE: Interpretation of information contained herein should be left to medically-trained personnel. Specific patient instructions are provided elsewhere under "Patient Instructions" section of medical record. This document was created in part using STT-dictation technology, any transcriptional errors that may result from this process are unintentional.  Patient: Theresa [name] Type: Established DOB: 1956-02-09 MRN: [id number] PCP: Birdie Sons, MD  Service: Procedure DOS: 11/11/2021 Setting: Ambulatory Location: Ambulatory outpatient facility Delivery: Face-to-face Provider: Gaspar Cola, MD Specialty: Interventional Pain Management Specialty designation: 09 Location: Outpatient facility Ref. Prov.: Milinda Pointer, MD    Primary Reason for Visit: Interventional Pain Management Treatment. CC: Back Pain (R>L)   Procedure:           Type: Lumbar Facet, Medial Branch Block(s) #5  Laterality: Bilateral  Level: L2, L3, L4, L5, & S1 Medial Branch Level(s). Injecting these levels blocks the L3-4, L4-5 and L5-S1 lumbar facet joints. Imaging: Fluoroscopic guidance Anesthesia: Local anesthesia (1-2% Lidocaine) Anxiolysis: None                 Sedation: None. DOS: 11/11/2021 Performed by: Gaspar Cola, MD  Primary Purpose: Diagnostic/Therapeutic Indications: Low back pain severe enough to impact quality of life or function. 1. Spondylosis without myelopathy or radiculopathy, lumbosacral region   2. Lumbar facet syndrome (Bilateral) (L>R)   3. Lumbar facet hypertrophy (multilevel)   4. DDD (degenerative disc disease), lumbar   5. Chronic low back pain (1ry area of Pain) (Bilateral) (L>R) w/o sciatica   6. Abnormal x-ray of lumbar spine   7. Abnormal MRI, lumbar spine (07/23/2019)    NAS-11 Pain score:   Pre-procedure: 7 /10   Post-procedure: 0-No pain/10     Position / Prep / Materials:  Position: Prone  Prep solution: DuraPrep (Iodine Povacrylex [0.7%  available iodine] and Isopropyl Alcohol, 74% w/w) Area Prepped: Posterolateral Lumbosacral Spine (Wide prep: From the lower border of the scapula down to the end of the tailbone and from flank to flank.)  Materials:  Tray: Block Needle(s):  Type: Spinal  Gauge (G): 22  Length: 5-in Qty: 4  Pre-op H&P Assessment:  Theresa [name] is a 66 y.o. (year old), female patient, seen today for interventional treatment. She  has a past surgical history that includes Abdominal hysterectomy; Tonsillectomy; Carpal tunnel release (Left, 2012); Tubal ligation; epigastric hernia repair (N/A, 08/13/2018); Umbilical hernia repair (N/A, 08/13/2018); and RIGHT/LEFT HEART CATH AND CORONARY ANGIOGRAPHY (N/A, 07/11/2019). Theresa [name] has a current medication list which includes the following prescription(s): albuterol, aspirin ec, cholecalciferol, fluticasone-umeclidin-vilant, hydrocodone-acetaminophen, ipratropium-albuterol, levothyroxine, oxygen-helium, potassium chloride sa, daliresp, sertraline, simvastatin, torsemide, [START ON 12/02/2021] hydrocodone-acetaminophen, and [START ON 01/01/2022] hydrocodone-acetaminophen, and the following Facility-Administered Medications: pentafluoroprop-tetrafluoroeth. Her primarily concern today is the Back Pain (R>L)  Initial Vital Signs:  Pulse/HCG Rate: 85ECG Heart Rate: 83 Temp: 99 F (37.2 C) Resp: 18 BP: 136/77 SpO2: 94 % (at 2.5L)  BMI: Estimated body mass index is 32.19 kg/m as calculated from the following:   Height as of this encounter: _0  (1.575 m).   Weight as of this encounter: 176 lb (79.8 kg).  Risk Assessment: Allergies: Reviewed. She is allergic to codeine and meloxicam.  Allergy Precautions: None required Coagulopathies: Reviewed. None identified.  Blood-thinner therapy: None at this time Active Infection(s): Reviewed. None identified. Theresa [name] is afebrile  Site Confirmation: Theresa [name] was asked to confirm the procedure and laterality before marking  the site Procedure checklist: Completed Consent: Before the procedure and under the influence of no sedative(s), amnesic(s), or anxiolytics,  the patient was informed of the treatment options, risks and possible complications. To fulfill our ethical and legal obligations, as recommended by the American Medical Association's Code of Ethics, I have informed the patient of my clinical impression; the nature and purpose of the treatment or procedure; the risks, benefits, and possible complications of the intervention; the alternatives, including doing nothing; the risk(s) and benefit(s) of the alternative treatment(s) or procedure(s); and the risk(s) and benefit(s) of doing nothing. The patient was provided information about the general risks and possible complications associated with the procedure. These may include, but are not limited to: failure to achieve desired goals, infection, bleeding, organ or nerve damage, allergic reactions, paralysis, and death. In addition, the patient was informed of those risks and complications associated to Spine-related procedures, such as failure to decrease pain; infection (i.e.: Meningitis, epidural or intraspinal abscess); bleeding (i.e.: epidural hematoma, subarachnoid hemorrhage, or any other type of intraspinal or peri-dural bleeding); organ or nerve damage (i.e.: Any type of peripheral nerve, nerve root, or spinal cord injury) with subsequent damage to sensory, motor, and/or autonomic systems, resulting in permanent pain, numbness, and/or weakness of one or several areas of the body; allergic reactions; (i.e.: anaphylactic reaction); and/or death. Furthermore, the patient was informed of those risks and complications associated with the medications. These include, but are not limited to: allergic reactions (i.e.: anaphylactic or anaphylactoid reaction(s)); adrenal axis suppression; blood sugar elevation that in diabetics may result in ketoacidosis or comma; water  retention that in patients with history of congestive heart failure may result in shortness of breath, pulmonary edema, and decompensation with resultant heart failure; weight gain; swelling or edema; medication-induced neural toxicity; particulate matter embolism and blood vessel occlusion with resultant organ, and/or nervous system infarction; and/or aseptic necrosis of one or more joints. Finally, the patient was informed that Medicine is not an exact science; therefore, there is also the possibility of unforeseen or unpredictable risks and/or possible complications that may result in a catastrophic outcome. The patient indicated having understood very clearly. We have given the patient no guarantees and we have made no promises. Enough time was given to the patient to ask questions, all of which were answered to the patient's satisfaction. Theresa [name] has indicated that she wanted to continue with the procedure. Attestation: I, the ordering provider, attest that I have discussed with the patient the benefits, risks, side-effects, alternatives, likelihood of achieving goals, and potential problems during recovery for the procedure that I have provided informed consent. Date   Time: 11/11/2021  8:06 AM  Pre-Procedure Preparation:  Monitoring: As per clinic protocol. Respiration, ETCO2, SpO2, BP, heart rate and rhythm monitor placed and checked for adequate function Safety Precautions: Patient was assessed for positional comfort and pressure points before starting the procedure. Time-out: I initiated and conducted the "Time-out" before starting the procedure, as per protocol. The patient was asked to participate by confirming the accuracy of the "Time Out" information. Verification of the correct person, site, and procedure were performed and confirmed by me, the nursing staff, and the patient. "Time-out" conducted as per Joint Commission's Universal Protocol (UP.01.01.01). Time: [phone]  Description of  Procedure:          Laterality: Bilateral. The procedure was performed in identical fashion on both sides. Levels:  L2, L3, L4, L5, & S1 Medial Branch Level(s)  Safety Precautions: Aspiration looking for blood return was conducted prior to all injections. At no point did we inject any substances, as a needle was being advanced.  Before injecting, the patient was told to immediately notify me if she was experiencing any new onset of "ringing in the ears, or metallic taste in the mouth". No attempts were made at seeking any paresthesias. Safe injection practices and needle disposal techniques used. Medications properly checked for expiration dates. SDV (single dose vial) medications used. After the completion of the procedure, all disposable equipment used was discarded in the proper designated medical waste containers. Local Anesthesia: Protocol guidelines were followed. The patient was positioned over the fluoroscopy table. The area was prepped in the usual manner. The time-out was completed. The target area was identified using fluoroscopy. A 12-in long, straight, sterile hemostat was used with fluoroscopic guidance to locate the targets for each level blocked. Once located, the skin was marked with an approved surgical skin marker. Once all sites were marked, the skin (epidermis, dermis, and hypodermis), as well as deeper tissues (fat, connective tissue and muscle) were infiltrated with a small amount of a short-acting local anesthetic, loaded on a 10cc syringe with a 25G, 1.5-in  Needle. An appropriate amount of time was allowed for local anesthetics to take effect before proceeding to the next step. Local Anesthetic: Lidocaine 2.0% The unused portion of the local anesthetic was discarded in the proper designated containers. Technical explanation of process:  L2 Medial Branch Nerve Block (MBB): The target area for the L2 medial branch is at the junction of the postero-lateral aspect of the superior  articular process and the superior, posterior, and medial edge of the transverse process of L3. Under fluoroscopic guidance, a Quincke needle was inserted until contact was made with os over the superior postero-lateral aspect of the pedicular shadow (target area). After negative aspiration for blood, 0.5 mL of the nerve block solution was injected without difficulty or complication. The needle was removed intact. L3 Medial Branch Nerve Block (MBB): The target area for the L3 medial branch is at the junction of the postero-lateral aspect of the superior articular process and the superior, posterior, and medial edge of the transverse process of L4. Under fluoroscopic guidance, a Quincke needle was inserted until contact was made with os over the superior postero-lateral aspect of the pedicular shadow (target area). After negative aspiration for blood, 0.5 mL of the nerve block solution was injected without difficulty or complication. The needle was removed intact. L4 Medial Branch Nerve Block (MBB): The target area for the L4 medial branch is at the junction of the postero-lateral aspect of the superior articular process and the superior, posterior, and medial edge of the transverse process of L5. Under fluoroscopic guidance, a Quincke needle was inserted until contact was made with os over the superior postero-lateral aspect of the pedicular shadow (target area). After negative aspiration for blood, 0.5 mL of the nerve block solution was injected without difficulty or complication. The needle was removed intact. L5 Medial Branch Nerve Block (MBB): The target area for the L5 medial branch is at the junction of the postero-lateral aspect of the superior articular process and the superior, posterior, and medial edge of the sacral ala. Under fluoroscopic guidance, a Quincke needle was inserted until contact was made with os over the superior postero-lateral aspect of the pedicular shadow (target area). After negative  aspiration for blood, 0.5 mL of the nerve block solution was injected without difficulty or complication. The needle was removed intact. S1 Medial Branch Nerve Block (MBB): The target area for the S1 medial branch is at the posterior and inferior 6 o'clock position of  the L5-S1 facet joint. Under fluoroscopic guidance, the Quincke needle inserted for the L5 MBB was redirected until contact was made with os over the inferior and postero aspect of the sacrum, at the 6 o' clock position under the L5-S1 facet joint (Target area). After negative aspiration for blood, 0.5 mL of the nerve block solution was injected without difficulty or complication. The needle was removed intact.  Once the entire procedure was completed, the treated area was cleaned, making sure to leave some of the prepping solution back to take advantage of its long term bactericidal properties.      Illustration of the posterior view of the lumbar spine and the posterior neural structures. Laminae of L2 through S1 are labeled. DPRL5, dorsal primary ramus of L5; DPRS1, dorsal primary ramus of S1; DPR3, dorsal primary ramus of L3; FJ, facet (zygapophyseal) joint L3-L4; I, inferior articular process of L4; LB1, lateral branch of dorsal primary ramus of L1; IAB, inferior articular branches from L3 medial branch (supplies L4-L5 facet joint); IBP, intermediate branch plexus; MB3, medial branch of dorsal primary ramus of L3; NR3, third lumbar nerve root; S, superior articular process of L5; SAB, superior articular branches from L4 (supplies L4-5 facet joint also); TP3, transverse process of L3.  Vitals:   11/11/21 0845 11/11/21 0850 11/11/21 0855 11/11/21 0901  BP: 115/76 124/73 122/70 104/72  Pulse: 88 84 82   Resp: 20 (!) 23 (!) 21   Temp:      TempSrc:      SpO2: 96% 98% 99% 95%  Weight:      Height:         Start Time: 0843 hrs. End Time: 0852 hrs.  Imaging Guidance (Spinal):          Type of Imaging Technique: Fluoroscopy  Guidance (Spinal) Indication(s): Assistance in needle guidance and placement for procedures requiring needle placement in or near specific anatomical locations not easily accessible without such assistance. Exposure Time: Please see nurses notes. Contrast: None used. Fluoroscopic Guidance: I was personally present during the use of fluoroscopy. "Tunnel Vision Technique" used to obtain the best possible view of the target area. Parallax error corrected before commencing the procedure. "Direction-depth-direction" technique used to introduce the needle under continuous pulsed fluoroscopy. Once target was reached, antero-posterior, oblique, and lateral fluoroscopic projection used confirm needle placement in all planes. Images permanently stored in EMR. Interpretation: No contrast injected. I personally interpreted the imaging intraoperatively. Adequate needle placement confirmed in multiple planes. Permanent images saved into the patient's record.  Antibiotic Prophylaxis:   Anti-infectives (From admission, onward)    None      Indication(s): None identified  Post-operative Assessment:  Post-procedure Vital Signs:  Pulse/HCG Rate: 82 (NSR)83 Temp: 99 F (37.2 C) Resp: (!) 21 BP: 104/72 SpO2: 95 %  EBL: None  Complications: No immediate post-treatment complications observed by team, or reported by patient.  Note: The patient tolerated the entire procedure well. A repeat set of vitals were taken after the procedure and the patient was kept under observation following institutional policy, for this type of procedure. Post-procedural neurological assessment was performed, showing return to baseline, prior to discharge. The patient was provided with post-procedure discharge instructions, including a section on how to identify potential problems. Should any problems arise concerning this procedure, the patient was given instructions to immediately contact us, at any time, without hesitation. In  any case, we plan to contact the patient by telephone for a follow-up status report regarding this interventional procedure.  Comments:  No additional relevant information.  Plan of Care  Orders:  Orders Placed This Encounter  Procedures   LUMBAR FACET(MEDIAL BRANCH NERVE BLOCK) MBNB    Scheduling Instructions:     Procedure: Lumbar facet block (AKA.: Lumbosacral medial branch nerve block)     Side: Bilateral     Level: L3-4 & L5-S1 Facets (L2, L3, L4, L5, & S1 Medial Branch Nerves)     Sedation: Patient's choice.     Timeframe: Today    Order Specific Question:   Where will this procedure be performed?    Answer:   [facility]   DG [facility] C-ARM 1-60 MIN NO REPORT    Intraoperative interpretation by procedural physician at [facility].    Standing Status:   Standing    Number of Occurrences:   1    Order Specific Question:   Reason for exam:    Answer:   Assistance in needle guidance and placement for procedures requiring needle placement in or near specific anatomical locations not easily accessible without such assistance.   Informed Consent Details: Physician/Practitioner Attestation; Transcribe to consent form and obtain patient signature    Nursing Order: Transcribe to consent form and obtain patient signature. Note: Always confirm laterality of pain with Theresa [name], before procedure.    Order Specific Question:   Physician/Practitioner attestation of informed consent for procedure/surgical case    Answer:   I, the physician/practitioner, attest that I have discussed with the patient the benefits, risks, side effects, alternatives, likelihood of achieving goals and potential problems during recovery for the procedure that I have provided informed consent.    Order Specific Question:   Procedure    Answer:   Lumbar Facet Block  under fluoroscopic guidance    Order Specific Question:   Physician/Practitioner performing the procedure    Answer:   My [place]  A. 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   Provide equipment / supplies at bedside    "Block Tray" (Disposable   single use) Needle type: SpinalSpinal Amount/quantity: 4 Size: Regular (3.5-inch) Gauge: 22G    Standing Status:   Standing    Number of Occurrences:   1    Order Specific Question:   Specify    Answer:   Block Tray   Chronic Opioid Analgesic:  Hydrocodone/APAP 5/325 one tablet every 8 hours (15 mg/day of hydrocodone) MME/day: 15 mg/day.   Medications ordered for procedure: Meds ordered this encounter  Medications   lidocaine (XYLOCAINE) 2 % (with pres) injection 400 mg   pentafluoroprop-tetrafluoroeth (GEBAUERS) aerosol   lactated ringers infusion 1,000 mL   midazolam (VERSED) 5 MG/5ML injection 0.5-2 mg    Make sure Flumazenil is available in the pyxis when using this medication. If oversedation occurs, administer 0.2 mg IV over 15 sec. If after 45 sec no response, administer 0.2 mg again over 1 min; may repeat at 1 min intervals; not to exceed 4 doses (1 mg)   ropivacaine (PF) 2 mg/mL (0.2%) (NAROPIN) injection 18 mL   triamcinolone acetonide (KENALOG-40) injection 80 mg   Medications administered: We administered lidocaine, lactated ringers, midazolam, ropivacaine (PF) 2 mg/mL (0.2%), and triamcinolone acetonide.  See the medical record for exact dosing, route, and time of administration.  Follow-up plan:   Return  in about 2 weeks (around 11/25/2021) for Proc-day (T,Th), (VV), (PPE).       Interventional Therapies  Risk   Complexity Considerations:   Estimated body mass index is 32.01 kg/m as calculated from the following:   Height as of this encounter: _0  (1.575 m).   Weight as of this encounter: 175 lb (79.4 kg). NOTE: NO further Lumbar RFA until BMI  <35.   Planned   Pending:   Pending further evaluation   Under consideration:   Palliative left L2-3 LESI #2    Completed:   Palliative bilateral lumbar facet block x4 (02/12/2019)  Therapeutic left L2-3 LESI x1 (09/10/2019)  Diagnostic/palliative left L4 TFESI x1  Diagnostic/palliative left L4-5 LESI x2  Diagnostic/palliative right L4 TFESI x1  Diagnostic/palliative right L4-5 LESI x1  Palliative left lumbar facet RFA x1 (06/27/2017)  Palliative right lumbar facet RFA x1 (10/03/2017)    Therapeutic   Palliative (PRN) options:   Palliative bilateral lumbar facet block #4  Diagnostic/palliative left L4 TFESI #2  Diagnostic/palliative left L4-5 LESI #3  Diagnostic/palliative right L4 TFESI #2  Diagnostic/palliative right L4-5 LESI #2  Palliative left lumbar facet RFA #2  Palliative right lumbar facet RFA #2      Recent Visits Date Type Provider Dept  10/27/21 Office Visit Milinda Pointer, MD [facility]  09/23/21 Procedure visit Milinda Pointer, MD [facility]  09/14/21 Office Visit Milinda Pointer, MD [facility]  08/31/21 Procedure visit Milinda Pointer, MD [facility]  Showing recent visits within past 90 days and meeting all other requirements Today's Visits Date Type Provider Dept  11/11/21 Procedure visit Milinda Pointer, MD [facility]  Showing today's visits and meeting all other requirements Future Appointments Date Type Provider Dept  11/25/21 Appointment Milinda Pointer, MD [facility]  01/26/22 Appointment Milinda Pointer, MD [facility]  Showing future appointments within next 90 days and meeting all other requirements  Disposition: Discharge home  Discharge (Date   Time): 11/11/2021; 0908 hrs.   Primary Care Physician: Birdie Sons, MD Location: [facility] Note by: Gaspar Cola, MD Date: 11/11/2021; Time: 11:14 AM  Disclaimer:   Medicine is not an Chief Strategy Officer. The only guarantee in medicine is that nothing is guaranteed. It is important to note that the decision to proceed with this intervention was based on the information collected from the patient. The Data and conclusions were drawn from the patient's questionnaire, the interview, and the physical examination. Because the information was provided in large part by the patient, it cannot be guaranteed that it has not been purposely or unconsciously manipulated. Every effort has been made to obtain as much relevant data as possible for this evaluation. It is important to note that the conclusions that lead to this procedure are derived in large part from the available data. Always take into account that the treatment will also be dependent on availability of resources and existing treatment guidelines, considered by other Pain Management Practitioners as being common knowledge and practice, at the time of the intervention. For Medico-Legal purposes, it is also important to point out that variation in procedural techniques and pharmacological choices are the acceptable norm. The indications, contraindications, technique, and results of the above procedure should only be interpreted and judged by a Board-Certified Interventional Pain Specialist with extensive familiarity and expertise in the same exact procedure and technique.

## 2021-11-12 ENCOUNTER — Telehealth: Payer: Self-pay

## 2021-11-12 NOTE — Telephone Encounter (Signed)
Post procedure phone call. Patient states she is doing good.  

## 2021-11-22 DIAGNOSIS — J9610 Chronic respiratory failure, unspecified whether with hypoxia or hypercapnia: Secondary | ICD-10-CM | POA: Diagnosis not present

## 2021-11-23 ENCOUNTER — Telehealth: Payer: Self-pay

## 2021-11-24 ENCOUNTER — Telehealth: Payer: Self-pay | Admitting: *Deleted

## 2021-11-24 NOTE — Telephone Encounter (Signed)
Attempted to call for pre appointment review of allergies/meds. Unable to leave a message, mailbox is full.

## 2021-11-24 NOTE — Progress Notes (Signed)
Patient: Theresa [name]  Service Category: E/M  Provider: Gaspar Cola, MD  DOB: 1956/07/22  DOS: 11/25/2021  Location: Office  MRN: [id number]  Setting: Ambulatory outpatient  Referring Provider: Birdie Sons, MD  Type: Established Patient  Specialty: Interventional Pain Management  PCP: Theresa Sons, MD  Location: Remote location  Delivery: TeleHealth     Virtual Encounter - Pain Management PROVIDER NOTE: Information contained herein reflects review and annotations entered in association with encounter. Interpretation of such information and data should be left to medically-trained personnel. Information provided to patient can be located elsewhere in the medical record under "Patient Instructions". Document created using STT-dictation technology, any transcriptional errors that may result from process are unintentional.    Contact & Pharmacy Preferred: [phone] Home: [phone] (home) Mobile: [phone] (mobile) E-mail: [email or web address]  Theresa [name], [address] [place] [address] Phone: [phone] Fax: [id number] [address], [place] [address] [place] [address] Phone: [phone] Fax: [phone]   Pre-screening  Theresa [name] offered "in-person" vs "virtual" encounter. She indicated preferring virtual for this encounter.   Reason COVID-19*   Social distancing based on CDC and AMA recommendations.   I contacted Theresa [name] on 11/25/2021 via telephone.      I clearly identified myself as Theresa Cola, MD. I verified that I was speaking with the correct person using two identifiers (Name: Theresa [name], and date of birth: 07-01-1956).  Consent I sought verbal advanced consent from Theresa [name] for virtual visit interactions. I informed Theresa [name] of possible security and privacy concerns, risks, and limitations associated with  providing "not-in-person" medical evaluation and management services. I also informed Theresa [name] of the availability of "in-person" appointments. Finally, I informed her that there would be a charge for the virtual visit and that she could be  personally, fully or partially, financially responsible for it. Theresa [name] expressed understanding and agreed to proceed.   Historic Elements   Theresa [name] is a 66 y.o. year old, female patient evaluated today after our last contact on 11/11/2021. Theresa [name]  has a past medical history of Acute postoperative pain (10/03/2017), Anxiety, BiPAP (biphasic positive airway pressure) dependence, Carpal tunnel syndrome, CHF (congestive heart failure) ([place]), CHF (congestive heart failure) ([place]), Chronic generalized abdominal pain, Chronic rhinitis, COPD (chronic obstructive pulmonary disease) ([place]), DDD (degenerative disc disease), cervical, DDD (degenerative disc disease), lumbosacral, Depression, Dyspnea, Edema, Flu, Gastritis, GERD (gastroesophageal reflux disease), Hematuria, Hernia of abdominal wall (07/17/2018), Hernia, abdominal, Hypertension, Kidney stones, Low back pain, Lumbar radiculitis, Malodorous urine, Muscle weakness, Obesity, Oxygen dependent, Renal cyst, Sensory urge incontinence, Thyroid activity decreased, Tobacco abuse, and Wheezing. She also  has a past surgical history that includes Abdominal hysterectomy; Tonsillectomy; Carpal tunnel release (Left, 2012); Tubal ligation; epigastric hernia repair (N/A, 08/13/2018); Umbilical hernia repair (N/A, 08/13/2018); and RIGHT/LEFT HEART CATH AND CORONARY ANGIOGRAPHY (N/A, 07/11/2019). Theresa [name] has a current medication list which includes the following prescription(s): albuterol, aspirin ec, cholecalciferol, fluticasone-umeclidin-vilant, hydrocodone-acetaminophen, [START ON 12/02/2021] hydrocodone-acetaminophen, [START ON 01/01/2022] hydrocodone-acetaminophen, ipratropium-albuterol, levothyroxine,  oxygen-helium, potassium chloride sa, daliresp, sertraline, simvastatin, and torsemide. She  reports that she has been smoking cigarettes. She has a 22.00 pack-year smoking history. She has never used smokeless tobacco. She reports that she does not drink alcohol and does not use drugs. Theresa [name] is allergic to codeine and meloxicam.  HPI  Today, she is being contacted for a post-procedure assessment.  The patient refers having attained a 100% relief of the pain for the duration of the local anesthetic however, once the local anesthetic wore off the pain on the left side returned.  Her last MRI was done on 07/23/2019 and at that time she had a large left subarticular disc extrusion at the L2-3 level extending inferiorly into the left L3-4 foraminal with mass effect on the descending left sided nerve roots.  At the L3-4 level there was also another disc extrusion extending inferiorly from the above levels cocked contacting the exiting left L3 nerve root.  Today we will be ordering a repeat MRI of the lumbar spine.  Depending on the result, we may need to recruit the help of a neurosurgeon for a left-sided L2-3 discectomy/decompression.  Post-procedure evaluation    Type: Lumbar Facet, Medial Branch Block(s) #5  Laterality: Bilateral  Level: L2, L3, L4, L5, & S1 Medial Branch Level(s). Injecting these levels blocks the L3-4, L4-5 and L5-S1 lumbar facet joints. Imaging: Fluoroscopic guidance Anesthesia: Local anesthesia (1-2% Lidocaine) Anxiolysis: None                 Sedation: None. DOS: 11/11/2021 Performed by: Theresa Cola, MD  Primary Purpose: Diagnostic/Therapeutic Indications: Low back pain severe enough to impact quality of life or function. 1. Spondylosis without myelopathy or radiculopathy, lumbosacral region   2. Lumbar facet syndrome (Bilateral) (L>R)   3. Lumbar facet hypertrophy (multilevel)   4. DDD (degenerative disc disease), lumbar   5. Chronic low back pain (1ry area  of Pain) (Bilateral) (L>R) w/o sciatica   6. Abnormal x-ray of lumbar spine   7. Abnormal MRI, lumbar spine (07/23/2019)    NAS-11 Pain score:   Pre-procedure: 7 /10   Post-procedure: 0-No pain/10      Effectiveness:  Initial hour after procedure: 100 %. Subsequent 4-6 hours post-procedure: 100 %. Analgesia past initial 6 hours:  (right side 65%,  left side 0%). Ongoing improvement:  Analgesic: The patient indicates having attained 100% relief of pain for the duration of the local anesthetic.  Unfortunately, once the local anesthetic wore off, some of the pain began to come back.  Indicates that the right side she continues to enjoy an ongoing 65% relief of the low back pain.  In the case of the left side, she is still having considerable low back pain and left lower extremity pain.  In reviewing her 2020 MRI, she has an L2-3, large left subarticular disc extrusion with inferior extension into the left L3-4 neuroforamen with mass effect on the descending left sided nerve roots. Function: Somewhat improved ROM: Somewhat improved  Pharmacotherapy Assessment   Opioid Analgesic: Hydrocodone/APAP 5/325 one tablet every 8 hours (15 mg/day of hydrocodone) MME/day: 15 mg/day.   Monitoring: [place] PMP: PDMP reviewed during this encounter.       Pharmacotherapy: No side-effects or adverse reactions reported. Compliance: No problems identified. Effectiveness: Clinically acceptable. Plan: Refer to "POC". UDS:  Summary  Date Value Ref Range Status  10/27/2021 Note  Final    Comment:    ==================================================================== [employer] Select 13 (MW) ==================================================================== Test                             Result       Flag       Units  Drug Present and Declared for Prescription Verification   Hydrocodone  653          EXPECTED   ng/mg creat   Hydromorphone                  76           EXPECTED   ng/mg  creat   Dihydrocodeine                 125          EXPECTED   ng/mg creat   Norhydrocodone                 625          EXPECTED   ng/mg creat    Sources of hydrocodone include scheduled prescription medications.    Hydromorphone, dihydrocodeine and norhydrocodone are expected    metabolites of hydrocodone. Hydromorphone and dihydrocodeine are    also available as scheduled prescription medications.  ==================================================================== Test                      Result    Flag   Units      Ref Range   Creatinine              72               mg/dL      >=20 ==================================================================== Declared Medications:  The flagging and interpretation on this report are based on the  following declared medications.  Unexpected results may arise from  inaccuracies in the declared medications.   **Note: The testing scope of this panel includes these medications:   Hydrocodone (Norco)   **Note: The testing scope of this panel does not include the  following reported medications:   Acetaminophen (Norco)  Albuterol (Ventolin HFA)  Albuterol (Duoneb)  Aspirin  Cholecalciferol  Fluticasone  Ipratropium (Duoneb)  Levothyroxine (Synthroid)  Potassium (Klor-Con)  Roflumilast (Daliresp)  Sertraline (Zoloft)  Simvastatin (Zocor)  Torsemide (Demadex)  Umeclidinium  Vilanterol ==================================================================== For clinical consultation, please call [phone]. ====================================================================      Laboratory Chemistry Profile   Renal Lab Results  Component Value Date   BUN 12 08/20/2021   CREATININE 0.76 08/20/2021   BCR 16 08/20/2021   GFRAA >60 06/26/2020   GFRNONAA >60 08/12/2021    Hepatic Lab Results  Component Value Date   AST 26 06/10/2021   ALT 26 06/10/2021   ALBUMIN 4.3 08/20/2021   ALKPHOS 115 06/10/2021   LIPASE 31  10/17/2017   AMMONIA 40 (H) 10/11/2019    Electrolytes Lab Results  Component Value Date   NA 146 (H) 08/20/2021   K 4.2 08/20/2021   CL 101 08/20/2021   CALCIUM 9.5 08/20/2021   MG 2.4 (H) 08/20/2021   PHOS 2.9 (L) 08/20/2021    Bone Lab Results  Component Value Date   25OHVITD1 13 (L) 09/17/2015   25OHVITD2 <1.0 09/17/2015   25OHVITD3 13 09/17/2015    Inflammation (CRP: Acute Phase) (ESR: Chronic Phase) Lab Results  Component Value Date   CRP 4.2 (H) 02/08/2020   ESRSEDRATE 16 08/08/2017   LATICACIDVEN 1.4 02/03/2020         Note: Above Lab results reviewed.  Imaging  DG PAIN CLINIC C-ARM 1-60 MIN NO REPORT Fluoro was used, but no Radiologist interpretation will be provided.  Please refer to "NOTES" tab for provider progress note.  Assessment  The primary encounter diagnosis was Chronic low back pain (1ry area of Pain) (Bilateral) (L>R) w/o sciatica. 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  Plan of Care  Problem-specific:  No problem-specific Assessment & Plan notes found for this encounter.  Ms. Cheron Coryell has a current medication list which includes the following long-term medication(s): albuterol, hydrocodone-acetaminophen, [START ON 12/02/2021] hydrocodone-acetaminophen, [START ON 01/01/2022] hydrocodone-acetaminophen, ipratropium-albuterol, levothyroxine, potassium chloride sa, sertraline, simvastatin, and torsemide.  Pharmacotherapy (Medications Ordered): No orders of the defined types were placed in this encounter.  Orders:  Orders Placed This Encounter  Procedures   MR LUMBAR SPINE WO  CONTRAST    Patient presents with axial pain with possible radicular component. Please assist [place] in identifying specific level(s) and laterality of any additional findings such as: 1. Facet (Zygapophyseal) joint DJD (Hypertrophy, space narrowing, subchondral sclerosis, and/or osteophyte formation) 2. DDD and/or IVDD (Loss of disc height, desiccation, gas patterns, osteophytes, endplate sclerosis, or "Black disc disease") 3. Pars defects 4. Spondylolisthesis, spondylosis, and/or spondyloarthropathies (include Degree/Grade of displacement in mm) (stability) 5. Vertebral body Fractures (acute/chronic) (state percentage of collapse) 6. Demineralization (osteopenia/osteoporotic) 7. Bone pathology 8. Foraminal narrowing  9. Surgical changes 10. Central, Lateral Recess, and/or Foraminal Stenosis (include AP diameter of stenosis in mm) 11. Surgical changes (hardware type, status, and presence of fibrosis) 12. Modic Type Changes (MRI only) 13. IVDD (Disc bulge, protrusion, herniation, extrusion) (Level, laterality, extent)    Standing Status:   Future    Standing Expiration Date:   12/23/2021    Scheduling Instructions:     Imaging must be done as soon as possible. Inform patient that order will expire within 30 days and I will not renew it.    Order Specific Question:   What is the patient's sedation requirement?    Answer:   No Sedation    Order Specific Question:   Does the patient have a pacemaker or implanted devices?    Answer:   No    Order Specific Question:   Preferred imaging location?    Answer:   [facility] (table limit-350lbs)    Order Specific Question:   Call Results- Best Contact Number?    Answer:   [phone] ([facility])    Order Specific Question:   Radiology Contrast Protocol - do NOT remove file path    Answer:   \[email or web address]   Follow-up plan:   Return for scheduled encounter.     Interventional Therapies  Risk    Complexity Considerations:   Estimated body mass index is 32.01 kg/m as calculated from the following:   Height as of this encounter: _0  (1.575 m).   Weight as of this encounter: 175 lb (79.4 kg). NOTE: NO further Lumbar RFA until BMI <35.   Planned   Pending:   Pending further evaluation   Under consideration:   Palliative left L2-3 LESI #2    Completed:   Palliative bilateral lumbar facet block x4 (02/12/2019)  Therapeutic left L2-3 LESI x1 (09/10/2019)  Diagnostic/palliative left L4 TFESI x1  Diagnostic/palliative left L4-5 LESI x2  Diagnostic/palliative right L4 TFESI x1  Diagnostic/palliative right L4-5 LESI x1  Palliative left lumbar facet RFA x1 (06/27/2017)  Palliative right lumbar facet RFA x1 (10/03/2017)    Therapeutic   Palliative (PRN) options:   Palliative bilateral lumbar facet  block #4  Diagnostic/palliative left L4 TFESI #2  Diagnostic/palliative left L4-5 LESI #3  Diagnostic/palliative right L4 TFESI #2  Diagnostic/palliative right L4-5 LESI #2  Palliative left lumbar facet RFA #2  Palliative right lumbar facet RFA #2       Recent Visits Date Type Provider Dept  11/11/21 Procedure visit Milinda Pointer, MD [facility]  10/27/21 Office Visit Milinda Pointer, MD [facility]  09/23/21 Procedure visit Milinda Pointer, MD [facility]  09/14/21 Office Visit Milinda Pointer, MD [facility]  08/31/21 Procedure visit Milinda Pointer, MD [facility]  Showing recent visits within past 90 days and meeting all other requirements Today's Visits Date Type Provider Dept  11/25/21 Office Visit Milinda Pointer, MD [facility]  Showing today's visits and meeting all other requirements Future Appointments Date Type Provider Dept  01/26/22 Appointment Milinda Pointer, MD [facility]  Showing future appointments within next 90 days and meeting all other requirements  I discussed  the assessment and treatment plan with the patient. The patient was provided an opportunity to ask questions and all were answered. The patient agreed with the plan and demonstrated an understanding of the instructions.  Patient advised to call back or seek an in-person evaluation if the symptoms or condition worsens.  Duration of encounter: 14 minutes.  Note by: Theresa Cola, MD Date: 11/25/2021; Time: 3:30 PM

## 2021-11-25 ENCOUNTER — Ambulatory Visit: Payer: Medicare HMO | Attending: Pain Medicine | Admitting: Pain Medicine

## 2021-11-25 ENCOUNTER — Other Ambulatory Visit: Payer: Self-pay

## 2021-11-25 DIAGNOSIS — G8929 Other chronic pain: Secondary | ICD-10-CM | POA: Diagnosis not present

## 2021-11-25 DIAGNOSIS — M25551 Pain in right hip: Secondary | ICD-10-CM | POA: Diagnosis not present

## 2021-11-25 DIAGNOSIS — M79605 Pain in left leg: Secondary | ICD-10-CM

## 2021-11-25 DIAGNOSIS — M545 Low back pain, unspecified: Secondary | ICD-10-CM

## 2021-11-25 DIAGNOSIS — M25552 Pain in left hip: Secondary | ICD-10-CM | POA: Diagnosis not present

## 2021-11-25 DIAGNOSIS — M48061 Spinal stenosis, lumbar region without neurogenic claudication: Secondary | ICD-10-CM

## 2021-11-25 DIAGNOSIS — R937 Abnormal findings on diagnostic imaging of other parts of musculoskeletal system: Secondary | ICD-10-CM

## 2021-11-25 DIAGNOSIS — M47816 Spondylosis without myelopathy or radiculopathy, lumbar region: Secondary | ICD-10-CM | POA: Diagnosis not present

## 2021-11-25 DIAGNOSIS — M79604 Pain in right leg: Secondary | ICD-10-CM | POA: Diagnosis not present

## 2021-11-25 DIAGNOSIS — M5126 Other intervertebral disc displacement, lumbar region: Secondary | ICD-10-CM | POA: Diagnosis not present

## 2021-11-25 DIAGNOSIS — M5417 Radiculopathy, lumbosacral region: Secondary | ICD-10-CM

## 2021-12-02 DIAGNOSIS — H2511 Age-related nuclear cataract, right eye: Secondary | ICD-10-CM | POA: Diagnosis not present

## 2021-12-02 DIAGNOSIS — Z961 Presence of intraocular lens: Secondary | ICD-10-CM | POA: Diagnosis not present

## 2021-12-02 DIAGNOSIS — Z135 Encounter for screening for eye and ear disorders: Secondary | ICD-10-CM | POA: Diagnosis not present

## 2021-12-03 ENCOUNTER — Ambulatory Visit: Payer: Self-pay

## 2021-12-03 DIAGNOSIS — I5032 Chronic diastolic (congestive) heart failure: Secondary | ICD-10-CM

## 2021-12-03 NOTE — Chronic Care Management (AMB) (Signed)
Chronic Care Management   CCM RN Visit Note  12/03/2021 Name: Brietta Manso MRN: [id number] DOB: 1956/02/20  Subjective: Tytiana Coles is a 66 y.o. year old female who is a primary care patient of Fisher, Kirstie Peri, MD. The care management team was consulted for assistance with disease management and care coordination needs.    Patient called to confirm availability for CCM outreach. Anticipates being available on 12/14/21. Denies urgent concerns. Agreed to contact the clinic if needed prior to her scheduled appointment.    PLAN: Will follow up on 12/14/21   [facility] [phone]

## 2021-12-09 ENCOUNTER — Ambulatory Visit
Admission: RE | Admit: 2021-12-09 | Discharge: 2021-12-09 | Disposition: A | Discharge status: Home / Self Care | Payer: Medicare HMO | Source: Ambulatory Visit | Attending: Pain Medicine | Admitting: Pain Medicine

## 2021-12-09 DIAGNOSIS — M79605 Pain in left leg: Secondary | ICD-10-CM | POA: Insufficient documentation

## 2021-12-09 DIAGNOSIS — R937 Abnormal findings on diagnostic imaging of other parts of musculoskeletal system: Secondary | ICD-10-CM | POA: Insufficient documentation

## 2021-12-09 DIAGNOSIS — M25551 Pain in right hip: Secondary | ICD-10-CM | POA: Insufficient documentation

## 2021-12-09 DIAGNOSIS — G8929 Other chronic pain: Secondary | ICD-10-CM | POA: Insufficient documentation

## 2021-12-09 DIAGNOSIS — M48061 Spinal stenosis, lumbar region without neurogenic claudication: Secondary | ICD-10-CM | POA: Diagnosis not present

## 2021-12-09 DIAGNOSIS — M47816 Spondylosis without myelopathy or radiculopathy, lumbar region: Secondary | ICD-10-CM | POA: Diagnosis not present

## 2021-12-09 DIAGNOSIS — M5126 Other intervertebral disc displacement, lumbar region: Secondary | ICD-10-CM | POA: Diagnosis not present

## 2021-12-09 DIAGNOSIS — M5127 Other intervertebral disc displacement, lumbosacral region: Secondary | ICD-10-CM | POA: Diagnosis not present

## 2021-12-09 DIAGNOSIS — M79604 Pain in right leg: Secondary | ICD-10-CM | POA: Insufficient documentation

## 2021-12-09 DIAGNOSIS — M5417 Radiculopathy, lumbosacral region: Secondary | ICD-10-CM | POA: Insufficient documentation

## 2021-12-09 DIAGNOSIS — M25552 Pain in left hip: Secondary | ICD-10-CM | POA: Insufficient documentation

## 2021-12-09 DIAGNOSIS — J449 Chronic obstructive pulmonary disease, unspecified: Secondary | ICD-10-CM | POA: Diagnosis not present

## 2021-12-09 DIAGNOSIS — M545 Low back pain, unspecified: Secondary | ICD-10-CM | POA: Insufficient documentation

## 2021-12-10 NOTE — Telephone Encounter (Signed)
Opened in error. KW

## 2021-12-13 ENCOUNTER — Telehealth: Payer: Self-pay | Admitting: Family

## 2021-12-13 ENCOUNTER — Ambulatory Visit: Payer: Medicare HMO | Admitting: Family

## 2021-12-13 NOTE — Telephone Encounter (Signed)
Patient did not show for her [facility] appointment on 12/13/21. Will attempt to reschedule.

## 2021-12-14 ENCOUNTER — Ambulatory Visit (INDEPENDENT_AMBULATORY_CARE_PROVIDER_SITE_OTHER): Payer: Medicare HMO

## 2021-12-14 DIAGNOSIS — R0902 Hypoxemia: Secondary | ICD-10-CM

## 2021-12-14 DIAGNOSIS — I5032 Chronic diastolic (congestive) heart failure: Secondary | ICD-10-CM | POA: Diagnosis not present

## 2021-12-14 DIAGNOSIS — I10 Essential (primary) hypertension: Secondary | ICD-10-CM | POA: Diagnosis not present

## 2021-12-14 DIAGNOSIS — J449 Chronic obstructive pulmonary disease, unspecified: Secondary | ICD-10-CM | POA: Diagnosis not present

## 2021-12-14 NOTE — Chronic Care Management (AMB) (Unsigned)
Chronic Care Management   CCM RN Visit Note  12/14/2021 Name: Theresa [name] MRN: [id number] DOB: [birth year]  Subjective: Theresa [name] is a 66 y.o. year old female who is a primary care patient of Fisher, Kirstie Peri, MD. The care management team was consulted for assistance with disease management and care coordination needs.    {CCMTELEPHONEFACETOFACE:21091510} for {CCMINITIALFOLLOWUPCHOICE:21091511} in response to provider referral for case management and/or care coordination services.   Consent to Services:  {CCMCONSENTOPTIONS:25074}  Patient agreed to services and verbal consent obtained.   Assessment: Review of patient past medical history, allergies, medications, health status, including review of consultants reports, laboratory and other test data, was performed as part of comprehensive evaluation and provision of chronic care management services.   SDOH (Social Determinants of Health) assessments and interventions performed:  SDOH Interventions    Flowsheet Row Most Recent Value  SDOH Interventions   Food Insecurity Interventions Intervention Not Indicated  Transportation Interventions Intervention Not Indicated        CCM Care Plan  Allergies  Allergen Reactions   Codeine Nausea And Vomiting   Meloxicam Other (See Comments)    Stomach pain    Outpatient Encounter Medications as of 12/14/2021  Medication Sig Note   albuterol (VENTOLIN HFA) 108 (90 Base) MCG/ACT inhaler Inhale 2 puffs into the lungs every 6 (six) hours as needed for wheezing or shortness of breath.    Fluticasone-Umeclidin-Vilant 100-62.5-25 MCG/INH AEPB Inhale 1 puff into the lungs daily.    HYDROcodone-acetaminophen (NORCO/VICODIN) 5-325 MG tablet Take 1 tablet by mouth every 8 (eight) hours as needed for severe pain. Must last 30 days    ipratropium-albuterol (DUONEB) 0.5-2.5 (3) MG/3ML SOLN USE 1 VIAL VIA NEBULIZER EVERY 6 HOURS AS NEEDED FOR SHORTNESS OF BREATH OR WHEEZING    levothyroxine  (SYNTHROID) 25 MCG tablet TAKE 1 TABLET(25 MCG) BY MOUTH DAILY BEFORE BREAKFAST    OXYGEN Inhale 2.5 L/min into the lungs.    potassium chloride SA (KLOR-CON) 20 MEQ tablet TAKE 1 TABLET(20 MEQ) BY MOUTH TWICE DAILY    Roflumilast (DALIRESP) 250 MCG TABS Take 500 mcg by mouth daily as needed.    sertraline (ZOLOFT) 100 MG tablet Take 2 tablets (200 mg total) by mouth every morning.    torsemide (DEMADEX) 20 MG tablet Take 2 tablets (40 mg total) by mouth 2 (two) times daily.    aspirin EC 81 MG tablet Take 81 mg by mouth daily.    Cholecalciferol 50 MCG (2000 UT) CAPS Take 2,000 Units by mouth daily.     HYDROcodone-acetaminophen (NORCO/VICODIN) 5-325 MG tablet Take 1 tablet by mouth every 8 (eight) hours as needed for severe pain. Must last 30 days    [START ON 01/01/2022] HYDROcodone-acetaminophen (NORCO/VICODIN) 5-325 MG tablet Take 1 tablet by mouth every 8 (eight) hours as needed for severe pain. Must last 30 days 10/27/2021: WARNING: Not a Duplicate. Future prescription. DO NOT DELETE during hospital medication reconciliation or at discharge. [facility] Chronic Pain Management Patient    simvastatin (ZOCOR) 20 MG tablet Take 1 tablet (20 mg total) by mouth at bedtime. (Patient not taking: Reported on 12/14/2021)    No facility-administered encounter medications on file as of 12/14/2021.    Patient Active Problem List   Diagnosis Date Noted   Acute respiratory failure with hypoxia ([place]) 08/07/2021   COPD exacerbation ([place]) 08/06/2021   Leukocytosis 08/06/2021   Encounter for tobacco use cessation counseling 08/06/2021   Chronic shoulder pain (Bilateral) 05/05/2021   Chronic use of opiate for  therapeutic purpose 01/26/2021   Left thigh pain 40/81/4481   Uncomplicated opioid dependence ([place]) 04/08/2020   Pharmacologic therapy 04/08/2020   Pneumonia due to COVID-19 virus 02/03/2020   Numbness and tingling in right hand 12/31/2019   Numbness in feet 12/31/2019   Altered consciousness 11/26/2019    Frequent falls 11/26/2019   Weakness of both lower extremities 11/26/2019   Lumbar disc extrusion (L2-3) (Left) 09/10/2019   Chronic knee pain (Left) 08/14/2019   Chronic left-sided lumbar radiculopathy 08/14/2019   Abnormal MRI, lumbar spine (07/23/2019) 08/14/2019   Acute on chronic respiratory failure with hypoxia (HCC) 07/06/2019   Cervicalgia (Bilateral) (R>L) 09/24/2018   Spondylosis without myelopathy or radiculopathy, lumbosacral region 09/24/2018   Epigastric hernia 05/04/2018   Chronic lower extremity pain (Bilateral) (L>R) 01/25/2018   Altered mental status, unspecified 12/09/2017   Chronic hip pain (3ry area of Pain) (Bilateral) (L>R) 11/27/2017   COPD (chronic obstructive pulmonary disease) ([place]) 08/11/2017   Other long term (current) drug therapy 08/08/2017   Disorder of bone, unspecified 08/08/2017   COPD with hypoxia ([place]) 06/27/2017   Arthralgia of acromioclavicular joint (Right) 06/15/2017   Acromioclavicular joint DJD (Right) 06/15/2017   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   Chronic shoulder pain (Left) 05/11/2017   Elevated brain natriuretic peptide (BNP) level 05/11/2017   Grief at loss of child 02/14/2017   Lumbar facet hypertrophy (multilevel) 11/17/2016   Lumbar spinal stenosis (L4-5) 11/17/2016   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) 11/17/2016   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) 11/17/2016   Lumbosacral radiculopathy at L4 11/17/2016   Supplemental oxygen dependent 08/01/2016   Sleep apnea 06/02/2016   Long term current use of opiate analgesic 01/12/2016   Long term prescription opiate use 01/12/2016   Opiate use (15 MME/Day) 01/12/2016   Lumbar facet syndrome (Bilateral) (L>R) 01/12/2016   Chronic sacroiliac joint pain (Bilateral) 01/12/2016   Vitamin D deficiency 12/30/2015   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) 12/28/2015   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[place]) 11/19/2015   Coagulation disorder  ([place]) 10/22/2015   Chronic pain syndrome (significant psychosocial component) 09/21/2015   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 09/21/2015   Encounter for therapeutic drug level monitoring 09/16/2015   Encounter for chronic pain management 09/16/2015   Pain management 09/16/2015   Neurogenic pain 09/16/2015   Neuropathic pain 09/16/2015   Musculoskeletal pain 09/16/2015   Lumbar spondylosis (L4-5) 09/16/2015   Chronic low back pain (1ry area of Pain) (Bilateral) (L>R) w/o sciatica 09/16/2015   Chronic lower extremity pain (2ry area of Pain) (Left) 09/16/2015   Chronic lumbar radicular pain (L4 & S1 Dermatome) (Left) 09/16/2015   Chronic shoulder pain (Right side) 09/16/2015   Chronic upper extremity pain (Right-sided) 09/16/2015   Cervical radiculitis (Right side) 09/16/2015   Abnormal x-ray of lumbar spine 09/16/2015   Chronic diastolic heart failure ([place]) 07/15/2015   Chest pain 07/15/2015   Tobacco abuse 07/15/2015   Tendinitis 04/21/2015   Blood clotting disorder ([place]) 04/21/2015   Decreased motor strength 07/16/2014   Clinical depression 07/16/2014   Chronic rhinitis 07/16/2014   Carpal tunnel syndrome 07/16/2014   Anxiety state 07/16/2014   Nausea without vomiting 07/16/2014   Edema 07/16/2014   Generalized muscle weakness 07/16/2014   Neuritis or radiculitis due to rupture of lumbar intervertebral disc 07/02/2014   DDD (degenerative disc disease), lumbar 07/02/2014   CAFL (chronic airflow limitation) ([place]) 02/27/2014   Herpes zoster 07/16/2009   Essential (primary) hypertension 03/27/2008  Conditions to be addressed/monitored:{CCM ASSESSMENT DZ OPTIONS:25047}  There are no care plans that you recently modified to display for this patient.   Plan:{CM FOLLOW UP PLAN:25073} SIG***

## 2021-12-14 NOTE — Patient Instructions (Signed)
Visit Information   Thank you for allowing the Chronic Care Management team to participate in your care. It was great speaking with you today!  Please don't hesitate to contact me if I can be of assistance to you before our next scheduled telephone appointment.    Our next appointment is January 14, 2022 at 3 pm.  Please call the care guide team at [phone] if you need to cancel or reschedule your appointment.     Following is a copy of your full care plan:  Consent to CCM Services: Ms. Marulanda was given information about Chronic Care Management services including:  CCM service includes personalized support from designated clinical staff supervised by her physician, including individualized plan of care and coordination with other care providers 24/7 contact phone numbers for assistance for urgent and routine care needs. Service will only be billed when office clinical staff spend 20 minutes or more in a month to coordinate care. Only one practitioner may furnish and bill the service in a calendar month. The patient may stop CCM services at any time (effective at the end of the month) by phone call to the office staff. The patient will be responsible for cost sharing (co-pay) of up to 20% of the service fee (after annual deductible is met).  Patient agreed to services and verbal consent obtained.   The patient verbalized understanding of instructions, educational materials, and care plan provided today and declined offer to receive copy of patient instructions, educational materials, and care plan.   A member of the care management team will follow up next month.   [facility] [phone]

## 2021-12-20 DIAGNOSIS — J9610 Chronic respiratory failure, unspecified whether with hypoxia or hypercapnia: Secondary | ICD-10-CM | POA: Diagnosis not present

## 2021-12-22 ENCOUNTER — Other Ambulatory Visit: Payer: Self-pay | Admitting: Family

## 2021-12-28 DIAGNOSIS — R059 Cough, unspecified: Secondary | ICD-10-CM | POA: Diagnosis not present

## 2021-12-28 DIAGNOSIS — J9601 Acute respiratory failure with hypoxia: Secondary | ICD-10-CM | POA: Diagnosis not present

## 2021-12-28 DIAGNOSIS — J449 Chronic obstructive pulmonary disease, unspecified: Secondary | ICD-10-CM | POA: Diagnosis not present

## 2021-12-28 DIAGNOSIS — I272 Pulmonary hypertension, unspecified: Secondary | ICD-10-CM | POA: Diagnosis not present

## 2021-12-28 DIAGNOSIS — Z9981 Dependence on supplemental oxygen: Secondary | ICD-10-CM | POA: Diagnosis not present

## 2021-12-28 DIAGNOSIS — J9801 Acute bronchospasm: Secondary | ICD-10-CM | POA: Diagnosis not present

## 2021-12-31 ENCOUNTER — Other Ambulatory Visit: Payer: Self-pay | Admitting: Family Medicine

## 2021-12-31 ENCOUNTER — Other Ambulatory Visit: Payer: Self-pay | Admitting: Obstetrics and Gynecology

## 2022-01-03 ENCOUNTER — Encounter: Payer: Self-pay | Admitting: Family Medicine

## 2022-01-03 ENCOUNTER — Telehealth: Payer: Self-pay | Admitting: Family Medicine

## 2022-01-03 ENCOUNTER — Other Ambulatory Visit: Payer: Self-pay

## 2022-01-03 DIAGNOSIS — E039 Hypothyroidism, unspecified: Secondary | ICD-10-CM

## 2022-01-03 MED ORDER — LEVOTHYROXINE SODIUM 25 MCG PO TABS: ORAL_TABLET | ORAL | 1 refills | Status: DC

## 2022-01-03 NOTE — Telephone Encounter (Signed)
[facility] faxed refill request for the following medications: ? ?levothyroxine (SYNTHROID) 25 MCG tablet  ? ?Please advise. ? ?

## 2022-01-03 NOTE — Telephone Encounter (Signed)
Patient advised and verbalized understanding.  ?

## 2022-01-03 NOTE — Telephone Encounter (Signed)
Medication refilled in other encounter.  ?

## 2022-01-03 NOTE — Telephone Encounter (Signed)
Requested medications are due for refill today.  No  ? ?Requested medications are on the active medications list.  no ? ?Last refill. 07/05/2021 ? ?Future visit scheduled.   no ? ?Notes to clinic.  Medication was discontinued 07/08/2021. ? ?Requested Prescriptions  ?Pending Prescriptions Disp Refills  ? digoxin (LANOXIN) 0.25 MG tablet [Pharmacy Med Name: DIGOXIN 250 MCG TAB] 90 tablet   ?  Sig: TAKE 1 TABLET BY MOUTH ONCE DAILY  ?  ? Cardiovascular:  Antiarrhythmic Agents - digoxin Failed - 12/31/2021  6:11 PM  ?  ?  Failed - Mg Level in normal range and within 360 days  ?  Magnesium  ?Date Value Ref Range Status  ?08/20/2021 2.4 (H) 1.6 - 2.3 mg/dL Final  ?  ?  ?  ?  Passed - Cr in normal range and within 180 days  ?  Creatinine  ?Date Value Ref Range Status  ?02/02/2015 0.70 mg/dL Final  ?  Comment:  ?  0.44-1.00 ?NOTE: New Reference Range ? 12/23/14 ?  ? ?Creatinine, Ser  ?Date Value Ref Range Status  ?08/20/2021 0.76 0.57 - 1.00 mg/dL Final  ?  ?  ?  ?  Passed - Digoxin (serum) in normal range and within 360 days  ?  Digoxin Level  ?Date Value Ref Range Status  ?08/07/2021 1.1 0.8 - 2.0 ng/mL Final  ?  Comment:  ?  Performed at [facility], [address] [place], [place] [address]  ?  ?  ?  ?  Passed - Ca in normal range and within 360 days  ?  Calcium  ?Date Value Ref Range Status  ?08/20/2021 9.5 8.7 - 10.3 mg/dL Final  ? ?Calcium, Total  ?Date Value Ref Range Status  ?02/02/2015 8.7 (L) mg/dL Final  ?  Comment:  ?  8.9-10.3 ?NOTE: New Reference Range ? 12/23/14 ?  ? ?Calcium, Ion  ?Date Value Ref Range Status  ?10/11/2019 1.16 1.15 - 1.40 mmol/L Final  ?  ?  ?  ?  Passed - K in normal range and within 180 days  ?  Potassium  ?Date Value Ref Range Status  ?08/20/2021 4.2 3.5 - 5.2 mmol/L Final  ?02/02/2015 3.9 mmol/L Final  ?  Comment:  ?  3.5-5.1 ?NOTE: New Reference Range ? 12/23/14 ?  ?  ?  ?  ?  Passed - Patient had ECG in the last 360 days  ?  ?  Passed - Last Heart Rate in normal range  ?   Pulse Readings from Last 1 Encounters:  ?11/11/21 82  ?  ?  ?  ?  Passed - Valid encounter within last 6 months  ?  Recent Outpatient Visits   ? ?      ? 4 months ago Need for immunization against influenza  ? [facility] Birdie Sons, MD  ? 4 months ago COPD exacerbation [name])  ? [facility] Birdie Sons, MD  ? 6 months ago Essential (primary) hypertension  ? [place], PA-C  ? 1 year ago COPD exacerbation ([place])  ? [place], PA-C  ? 1 year ago Chronic obstructive pulmonary disease with acute exacerbation ([place])  ? [place], PA-C  ? ?  ?  ? ?  ?  ?  ?  ?

## 2022-01-03 NOTE — Telephone Encounter (Signed)
Please advise patient that digoxin was supposed to be discontinued when she was discharged from hospital in October. This medication is usually prescribed for systolic heart failure, but she has diastolic heart failure. I suggest she discuss this at her upcoming appt at the heart failure clinic.  ?

## 2022-01-05 DIAGNOSIS — R0602 Shortness of breath: Secondary | ICD-10-CM | POA: Diagnosis not present

## 2022-01-05 IMAGING — CR DG CHEST 2V
1 series · 2 of 2 positions shown · non-contrast
Comparison: February 04, 2021.

CLINICAL DATA: 3 days she has had increased SHOB

EXAM:
CHEST - 2 VIEW

[Series 1: dg chest 2 view · 0.14mm/px · 2 of 2 slices shown]
[im 1/2]
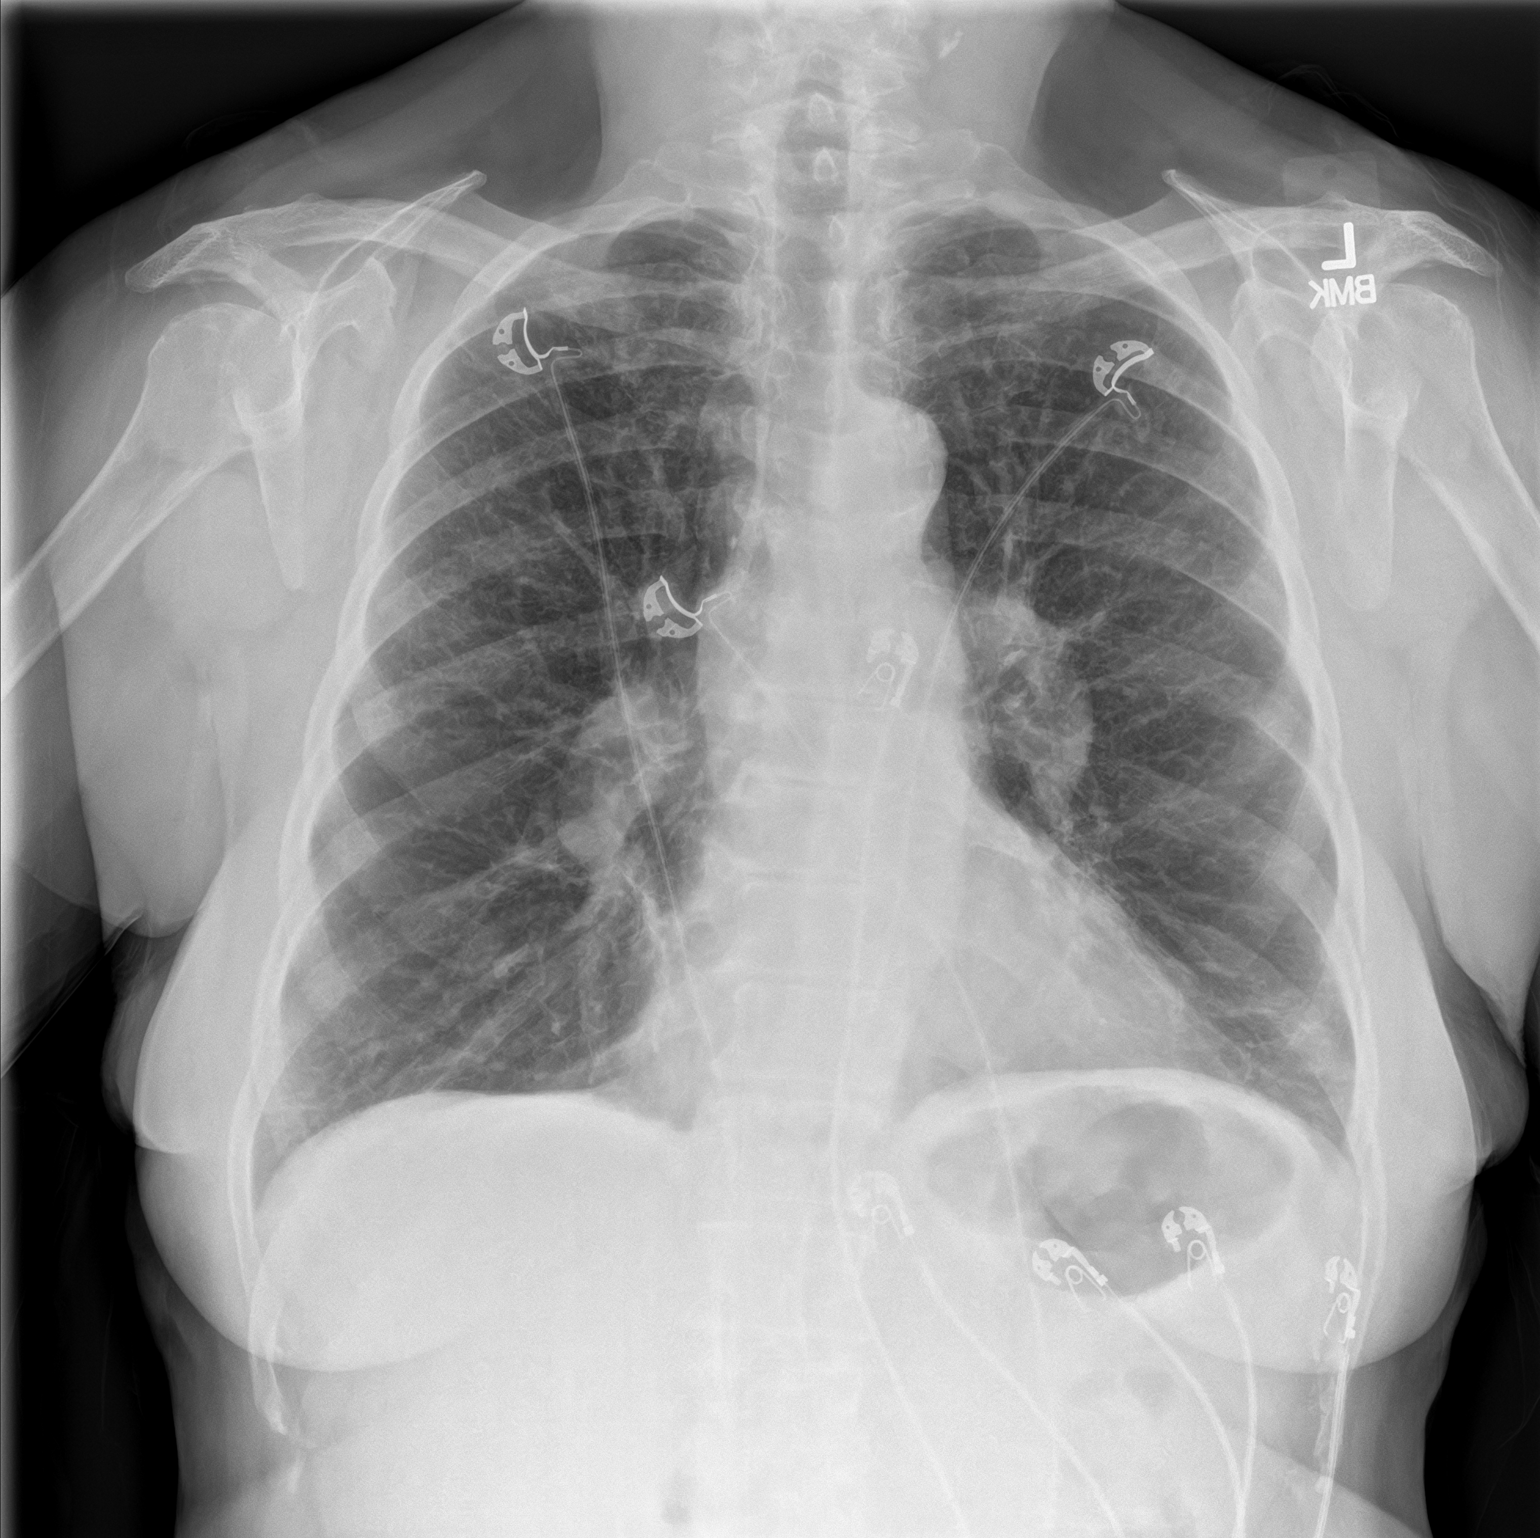
[im 2/2]
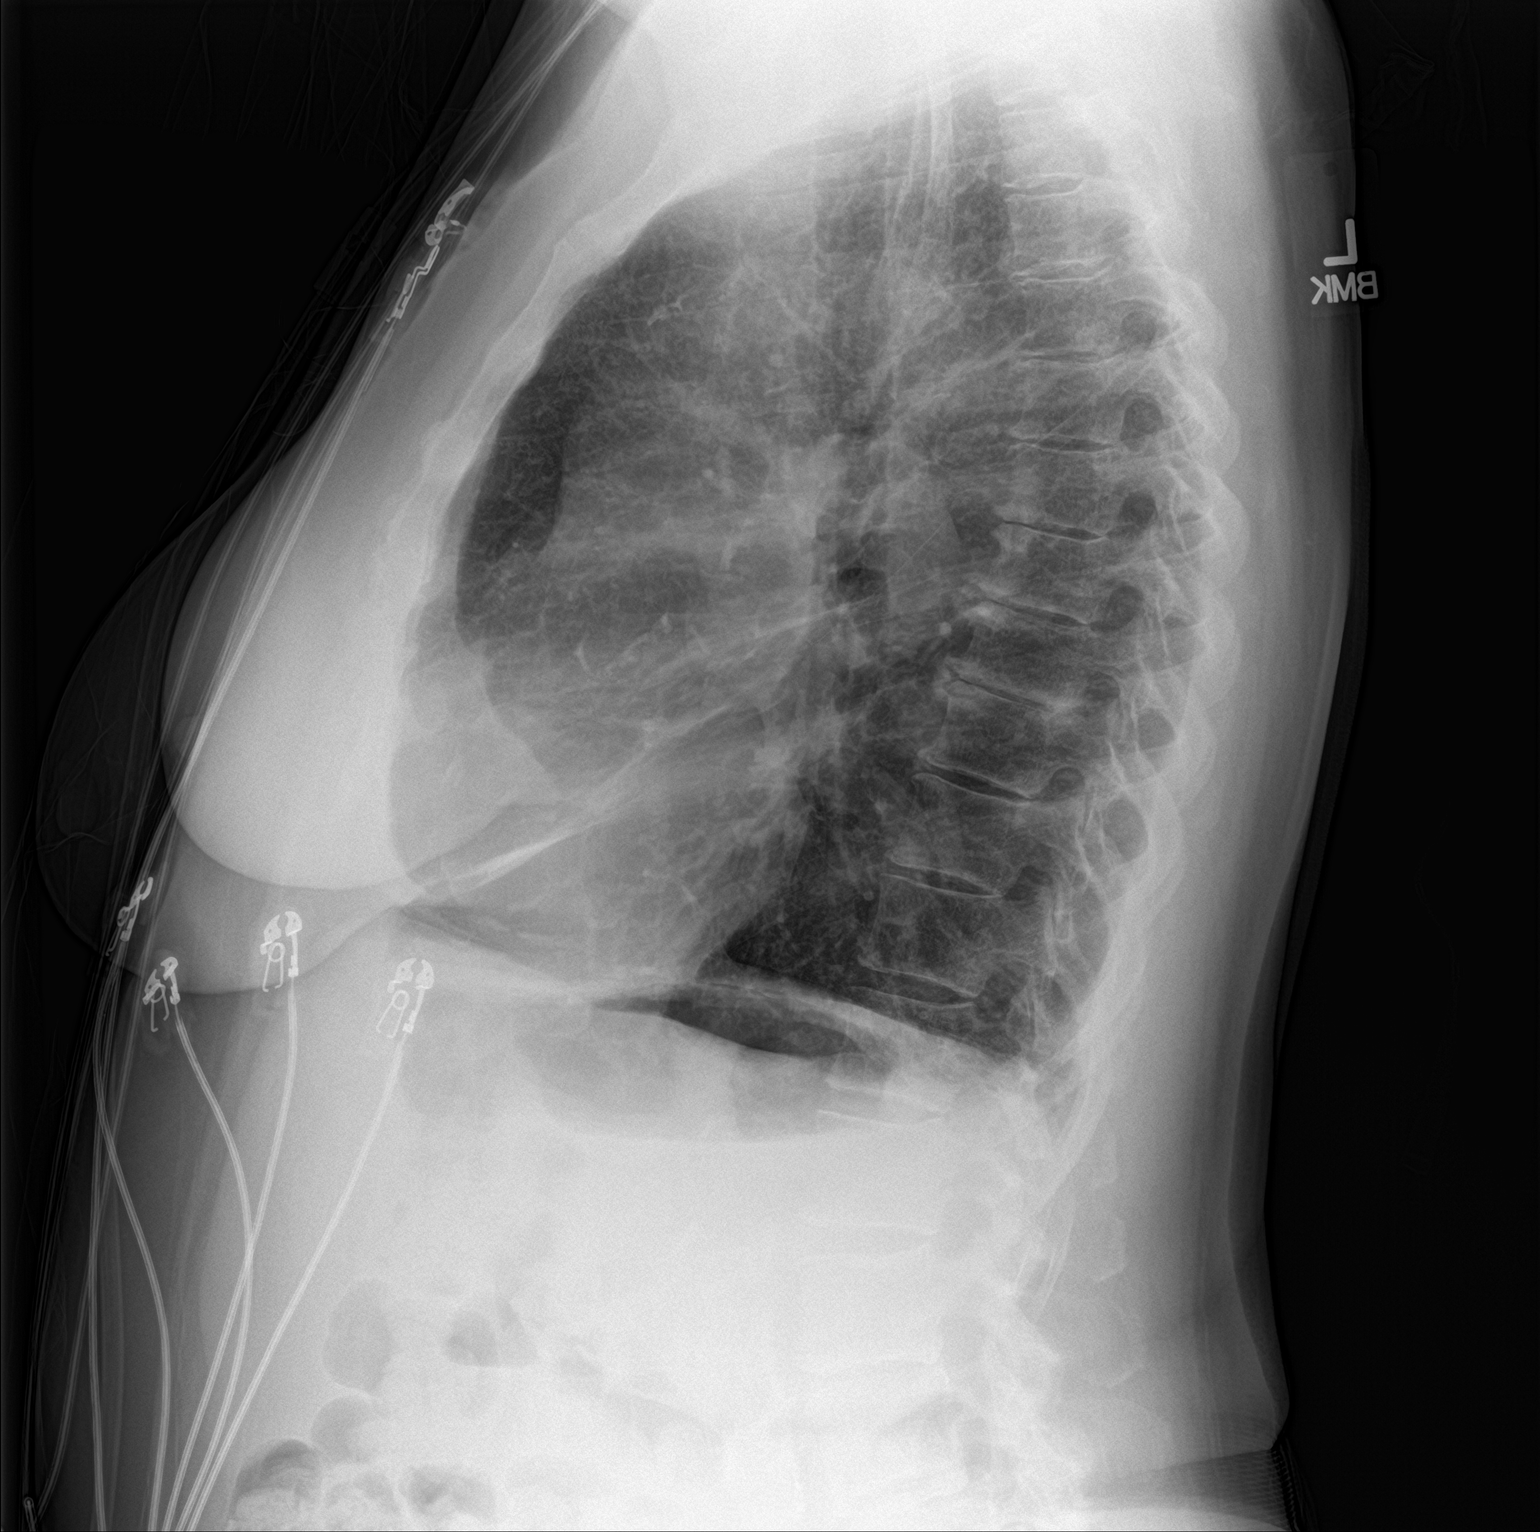

[2 of 2 positions shown; findings below may reference images not displayed]

FINDINGS: The heart size and mediastinal contours are within normal limits.
Central vascular prominence. Chronic bronchitic lung changes with
pulmonary hyperinflation. Linear atelectasis in bilateral lung
bases. No visible pleural effusion or pneumothorax. The visualized
skeletal structures are unremarkable.
IMPRESSION: 1. Chronic bronchitic lung changes with pulmonary hyperinflation.
2. Linear atelectasis in bilateral lung bases.

## 2022-01-05 IMAGING — CT CT ANGIO CHEST
2 of 6 series · 17 of 46 positions shown · IV contrast (APPLIED)
Comparison: Chest CTA 02/04/2021.

CLINICAL DATA: 65-year-old female with history of shortness of
breath and right-sided back pain. Suspected pulmonary embolism. On
oxygen at home.

EXAM:
CT ANGIOGRAPHY CHEST WITH CONTRAST
TECHNIQUE: Multidetector CT imaging of the chest was performed using the
standard protocol during bolus administration of intravenous
contrast. Multiplanar CT image reconstructions and MIPs were
obtained to evaluate the vascular anatomy.
CONTRAST:  75mL OMNIPAQUE IOHEXOL 350 MG/ML SOLN

[Series 5: thins · axial · 0.79mm/px · z∈[-650,-376]mm · 14 of 374 slices shown]
[im 16/374  lung]
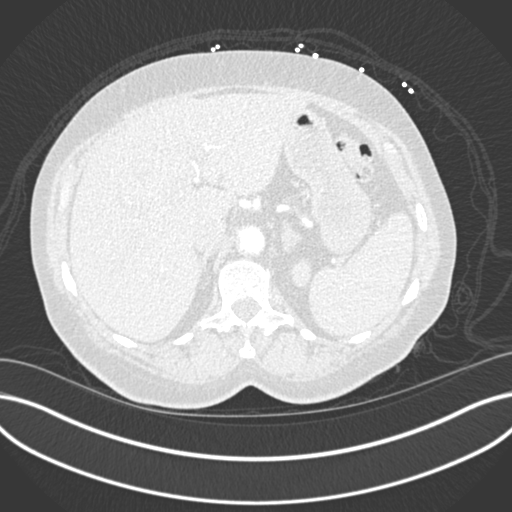
[im 47/374  soft-tissue]
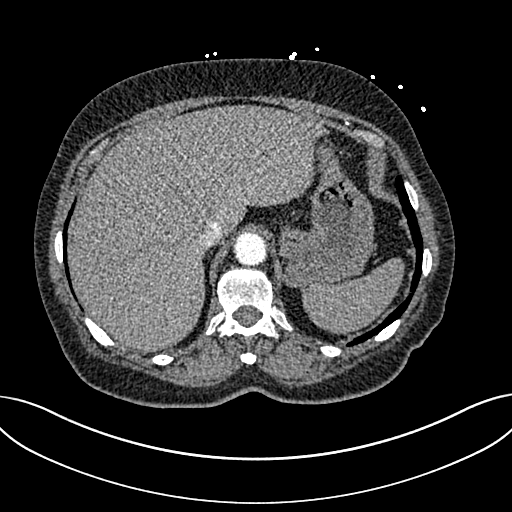
[im 78/374  lung]
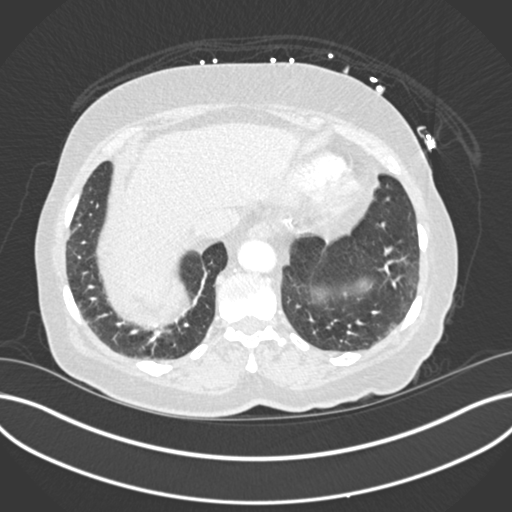
[im 94/374  soft-tissue]
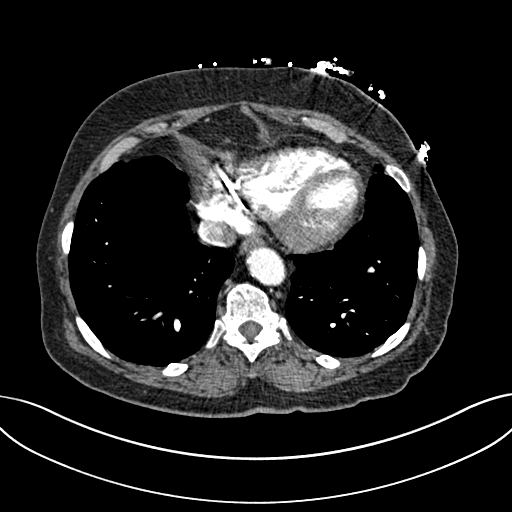
[im 125/374  lung]
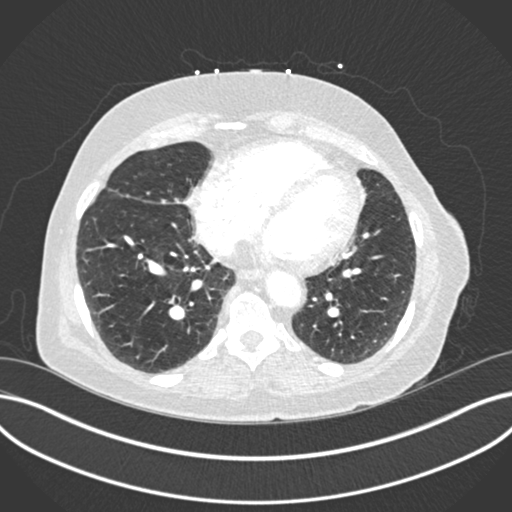
[im 156/374  soft-tissue]
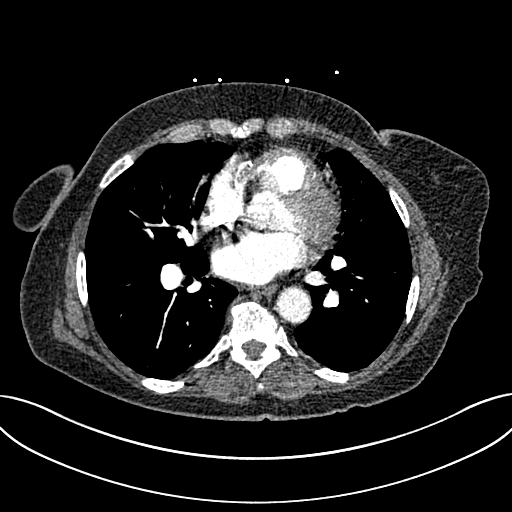
[im 171/374  lung]
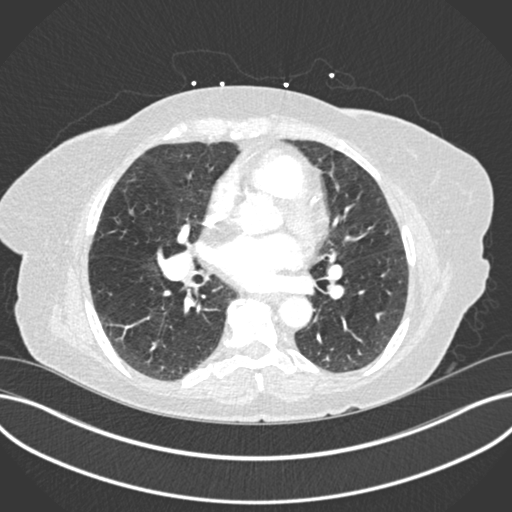
[im 203/374  soft-tissue]
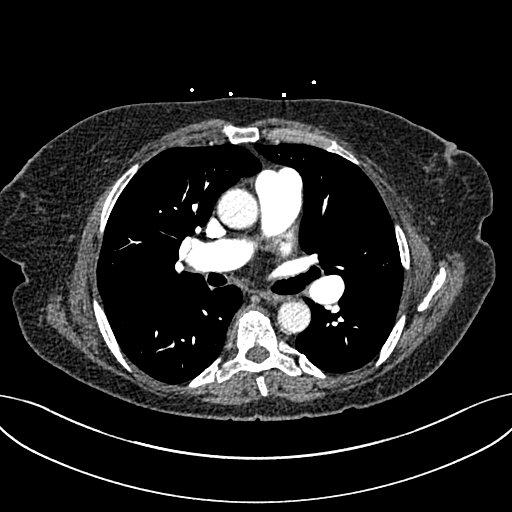
[im 218/374  lung]
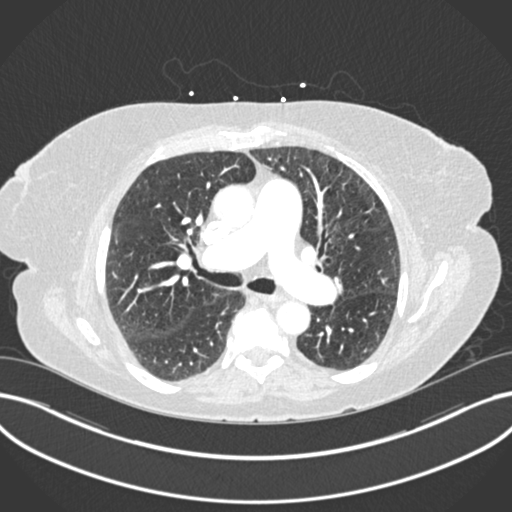
[im 249/374  soft-tissue]
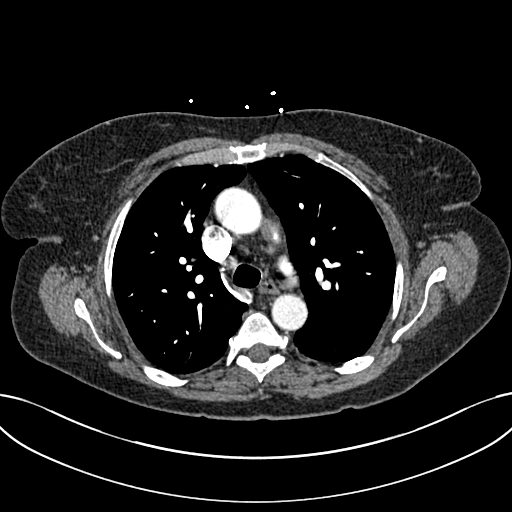
[im 280/374  lung]
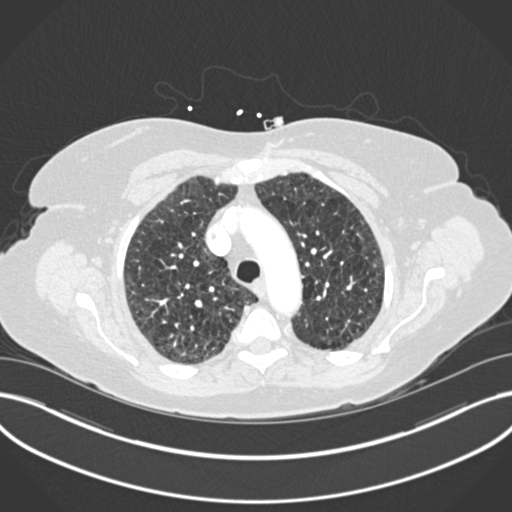
[im 296/374  soft-tissue]
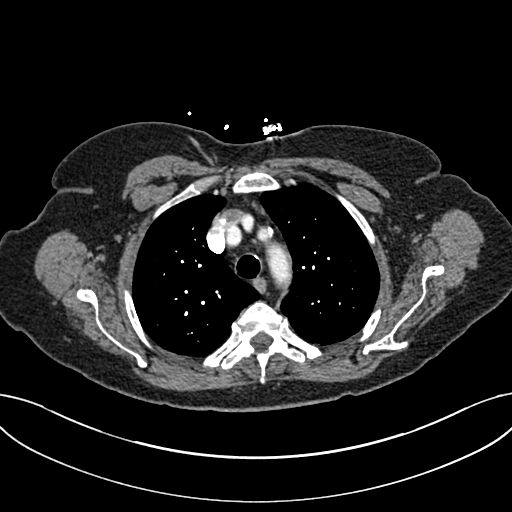
[im 327/374  lung]
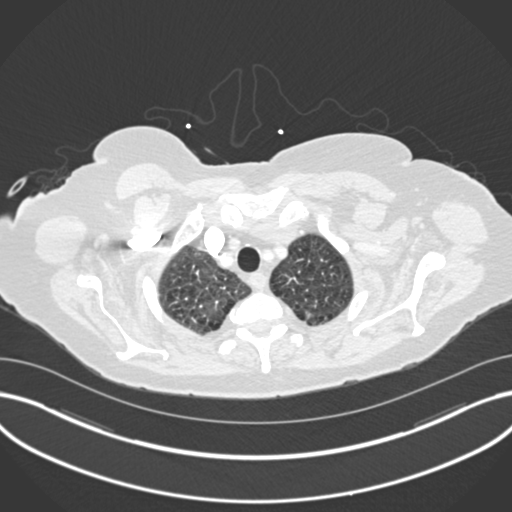
[im 358/374  soft-tissue]
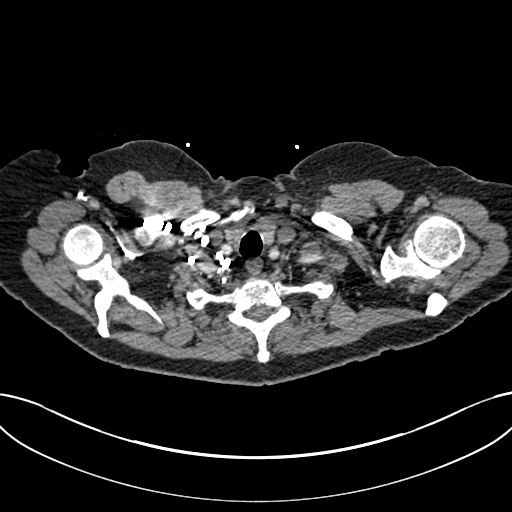

[Series 7: coronal mpr · coronal · 0.58mm/px · 3 of 92 slices shown]
[im 23/92  soft-tissue]
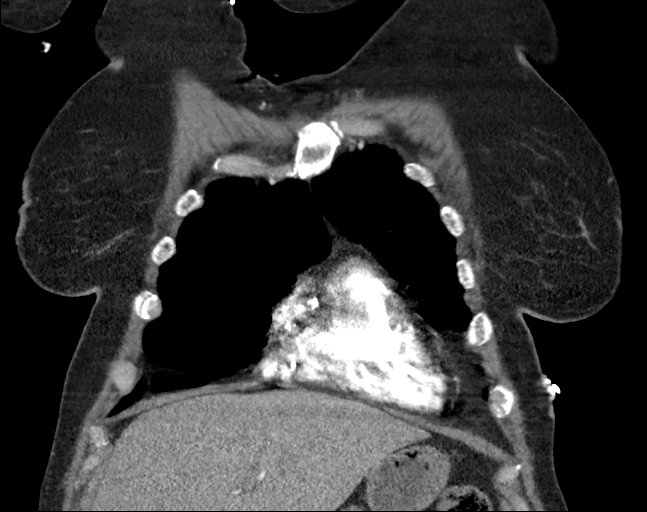
[im 46/92  soft-tissue]
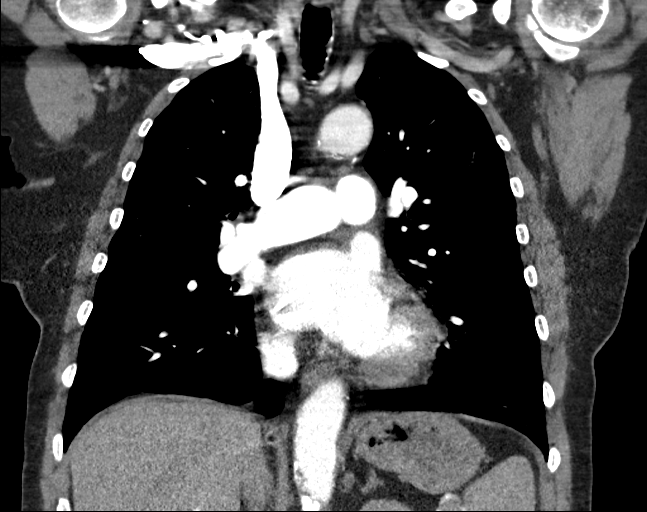
[im 69/92  soft-tissue]
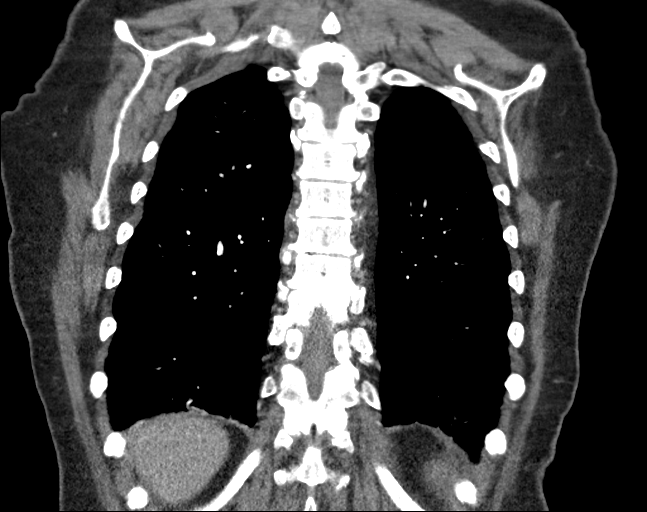

[17 of 46 positions shown; findings below may reference images not displayed]

FINDINGS: Cardiovascular: No filling defects within the pulmonary arterial
tree to suggest pulmonary embolism. Heart size is normal. There is
no significant pericardial fluid, thickening or pericardial
calcification. There is aortic atherosclerosis, as well as
atherosclerosis of the great vessels of the mediastinum and the
coronary arteries, including calcified atherosclerotic plaque in the
left anterior descending, left circumflex and right coronary
arteries. Dilatation of the pulmonic trunk (3.6 cm in diameter).

Mediastinum/Nodes: No pathologically enlarged mediastinal or hilar
lymph nodes. Esophagus is unremarkable in appearance. No axillary
lymphadenopathy.

Lungs/Pleura: No acute consolidative airspace disease. No pleural
effusions. Diffuse bronchial wall thickening with moderate
centrilobular and paraseptal emphysema. A few scattered tiny
pulmonary nodules are noted, largest of which measures only 3 mm in
the right lower lobe (axial image 72 of series 6), stable dating
back to 04/02/2020, considered definitively benign. No larger more
suspicious appearing pulmonary nodules or masses are noted.

Upper Abdomen: Multiple small low-attenuation lesions scattered
throughout the visualized hepatic parenchyma, similar to prior
examinations, incompletely characterized on today's examination, but
statistically likely to represent small cysts. Aortic
atherosclerosis.

Musculoskeletal: There are no aggressive appearing lytic or blastic
lesions noted in the visualized portions of the skeleton.

Review of the MIP images confirms the above findings.
IMPRESSION: 1. No evidence of pulmonary embolism.
2. No acute findings in the thorax to account for the patient's
symptoms.
3. Diffuse bronchial wall thickening with moderate centrilobular and
paraseptal emphysema; imaging findings suggestive of underlying
COPD.
4. Dilatation of the pulmonic trunk (3.6 cm in diameter), concerning
for associated pulmonary arterial hypertension.
5. Aortic atherosclerosis, in addition to three vessel coronary
artery disease. Please note that although the presence of coronary
artery calcium documents the presence of coronary artery disease,
the severity of this disease and any potential stenosis cannot be
assessed on this non-gated CT examination. Assessment for potential
risk factor modification, dietary therapy or pharmacologic therapy
may be warranted, if clinically indicated.

Aortic Atherosclerosis (XRT3T-V5L.L) and Emphysema (XRT3T-7Q0.O).

## 2022-01-06 ENCOUNTER — Other Ambulatory Visit: Payer: Self-pay | Admitting: Family

## 2022-01-06 ENCOUNTER — Other Ambulatory Visit
Admission: RE | Admit: 2022-01-06 | Discharge: 2022-01-06 | Disposition: A | Discharge status: Home / Self Care | Payer: Medicare HMO | Source: Ambulatory Visit | Attending: Family | Admitting: Family

## 2022-01-06 ENCOUNTER — Encounter: Payer: Self-pay | Admitting: Family

## 2022-01-06 ENCOUNTER — Telehealth: Payer: Self-pay | Admitting: Family

## 2022-01-06 ENCOUNTER — Ambulatory Visit (HOSPITAL_BASED_OUTPATIENT_CLINIC_OR_DEPARTMENT_OTHER): Payer: Medicare HMO | Admitting: Family

## 2022-01-06 ENCOUNTER — Other Ambulatory Visit: Payer: Self-pay

## 2022-01-06 VITALS — BP 121/69 | HR 91 | Resp 18 | Ht 62.0 in | Wt 186.5 lb

## 2022-01-06 DIAGNOSIS — Z09 Encounter for follow-up examination after completed treatment for conditions other than malignant neoplasm: Secondary | ICD-10-CM | POA: Insufficient documentation

## 2022-01-06 DIAGNOSIS — Z720 Tobacco use: Secondary | ICD-10-CM | POA: Diagnosis not present

## 2022-01-06 DIAGNOSIS — I110 Hypertensive heart disease with heart failure: Secondary | ICD-10-CM | POA: Insufficient documentation

## 2022-01-06 DIAGNOSIS — R0602 Shortness of breath: Secondary | ICD-10-CM | POA: Insufficient documentation

## 2022-01-06 DIAGNOSIS — M545 Low back pain, unspecified: Secondary | ICD-10-CM | POA: Insufficient documentation

## 2022-01-06 DIAGNOSIS — I5032 Chronic diastolic (congestive) heart failure: Secondary | ICD-10-CM

## 2022-01-06 DIAGNOSIS — Z8616 Personal history of COVID-19: Secondary | ICD-10-CM | POA: Insufficient documentation

## 2022-01-06 DIAGNOSIS — J441 Chronic obstructive pulmonary disease with (acute) exacerbation: Secondary | ICD-10-CM | POA: Diagnosis not present

## 2022-01-06 DIAGNOSIS — F32A Depression, unspecified: Secondary | ICD-10-CM | POA: Diagnosis not present

## 2022-01-06 DIAGNOSIS — G8929 Other chronic pain: Secondary | ICD-10-CM | POA: Insufficient documentation

## 2022-01-06 DIAGNOSIS — Z716 Tobacco abuse counseling: Secondary | ICD-10-CM | POA: Insufficient documentation

## 2022-01-06 DIAGNOSIS — F419 Anxiety disorder, unspecified: Secondary | ICD-10-CM | POA: Insufficient documentation

## 2022-01-06 DIAGNOSIS — J449 Chronic obstructive pulmonary disease, unspecified: Secondary | ICD-10-CM | POA: Diagnosis not present

## 2022-01-06 DIAGNOSIS — I10 Essential (primary) hypertension: Secondary | ICD-10-CM | POA: Diagnosis not present

## 2022-01-06 DIAGNOSIS — F17200 Nicotine dependence, unspecified, uncomplicated: Secondary | ICD-10-CM | POA: Insufficient documentation

## 2022-01-06 DIAGNOSIS — Z9981 Dependence on supplemental oxygen: Secondary | ICD-10-CM | POA: Insufficient documentation

## 2022-01-06 DIAGNOSIS — F17210 Nicotine dependence, cigarettes, uncomplicated: Secondary | ICD-10-CM | POA: Diagnosis not present

## 2022-01-06 DIAGNOSIS — R69 Illness, unspecified: Secondary | ICD-10-CM | POA: Diagnosis not present

## 2022-01-06 LAB — BASIC METABOLIC PANEL
Anion gap: 12 (ref 5–15)
BUN: 18 mg/dL (ref 8–23)
CO2: 34 mmol/L — ABNORMAL HIGH (ref 22–32)
Calcium: 9.8 mg/dL (ref 8.9–10.3)
Chloride: 97 mmol/L — ABNORMAL LOW (ref 98–111)
Creatinine, Ser: 0.82 mg/dL (ref 0.44–1.00)
GFR, Estimated: >60 mL/min (ref >60)
Glucose, Bld: 122 mg/dL — ABNORMAL HIGH (ref 70–99)
Potassium: 3 mmol/L — ABNORMAL LOW (ref 3.5–5.1)
Sodium: 143 mmol/L (ref 135–145)

## 2022-01-06 MED ORDER — POTASSIUM CHLORIDE CRYS ER 20 MEQ PO TBCR: 40.0000 meq | EXTENDED_RELEASE_TABLET | Freq: Two times a day (BID) | ORAL | 3 refills | Status: DC

## 2022-01-06 NOTE — Telephone Encounter (Signed)
Late entry: ? ?Spoke to patient regarding BMP results obtained earlier today. Diuretic had been increased but she said her potassium dose remained the same. Potassium level today is low at 3.0 although renal function looks great.  ? ?Currently taking potassium 3mq BID and torsemide 867mAM/ 4059mM.  ? ?Advised patient to take an additional 65m45motassium tonight. Starting tomorrow, she will take 40me19mD.  ? ?Will recheck labs on 01/19/22. She will call when her current potassium bottle gets low so a new one can get sent in. She verbalized understanding and repeated the instructions back to me.  ?

## 2022-01-06 NOTE — Patient Instructions (Addendum)
Continue weighing daily and call for an overnight weight gain of 3 pounds or more or a weekly weight gain of more than 5 pounds. ? ? ?If you have voicemail, please make sure your mailbox is cleaned out so that we may leave a message and please make sure to listen to any voicemails.  ? ? ? ?Per Dr Gust Brooms note, to start [place] 63m daily for your pulmonary hypertension ? ? ?

## 2022-01-06 NOTE — Progress Notes (Signed)
? Patient ID: Theresa [name], female    DOB: 1955-11-06, 66 y.o.   MRN: [id number] ? ?HPI ?Ms Theresa [name] is a 66 y/o female with a history of chronic low back pain, HTN, depression, COPD on long-term oxygen, anxiety, carpal tunnel syndrome, long-standing tobacco use and chronic heart failure. ? ?Echo report from 08/08/21 reviewed and showed an EF of 60-65% along with mild LAE. Echo report from 09/03/20 reviewed and showed an EF of >55% with mild LAE and severe TR. Echo report from 07/07/2019 reviewed and showed an EF of 60-65% along with mild TR and moderately elevated PA pressure. Echo done 11/03/15 but unable to access results. Echo done on 02/19/15 showed an EF of 50% without valvular stenosis.  ? ?Catheterization done 07/11/2019 showed:  ?Prox RCA to Dist RCA lesion is 50% stenosed. ?Mid LAD lesion is 50% stenosed. ?Ost LM to Dist LM lesion is 10% stenosed. ?Hemodynamic findings consistent with moderate pulmonary hypertension. ?Normal overall left ventricular function of at least 60% ?Moderate coronary disease no significant obstructive disease ?Moderate pulmonary hypertension ? ?Admitted 08/06/21 due to COPD exacerbation. Given IV antibiotics, systemic steroid and nebulizers. Hypokalemia corrected. PT/OT evaluations done. Discharged after 6 days.  ? ?She presents today for a follow-up visit with a chief complaint of moderate shortness of breath with little exertion. She describes this as chronic having been present for several years although feels like it's worsened over the last several days. She has associated fatigue, cough, wheezing, intermittent chest pain, anxiety and difficulty sleeping along with this. She denies any dizziness, pedal edema, palpitations, abdominal distention or weight gain.  ? ?Saw cardiology yesterday and had torsemide increased to 96m AM/ 438mQPM. Potassium dose remained the same.  ? ?Past Medical History:  ?Diagnosis Date  ? Acute postoperative pain 10/03/2017  ? Anxiety   ? BiPAP (biphasic  positive airway pressure) dependence   ? at hs  ? Carpal tunnel syndrome   ? CH[place]estive [place] failure) ([facility]  ? 1/18  ? [place]ive [place] failure) ([facility]  ? Chronic generalized abdominal pain   ? Chronic rhinitis   ? COPD [place] disease[place] [place]  ? DDD (degenerative disc disease), cervical   ? DDD (degenerative disc disease), lumbosacral   ? Depression   ? Dyspnea   ? Edema   ? Flu   ? 1/18  ? Gastritis   ? GERD (gastroesophageal reflux disease)   ? Hematuria   ? Hernia of abdominal wall 07/17/2018  ? Hernia, abdominal   ? Hypertension   ? Kidney stones   ? Low back pain   ? Lumbar radiculitis   ? Malodorous urine   ? Muscle weakness   ? Obesity   ? Oxygen dependent   ? Pneumonia due to COVID-19 virus 02/03/2020  ? Renal cyst   ? Sensory urge incontinence   ? Thyroid activity decreased   ? 9/19  ? Tobacco abuse   ? Wheezing   ? ?Past Surgical History:  ?Procedure Laterality Date  ? ABDOMINAL HYSTERECTOMY    ? CARPAL TUNNEL RELEASE Left 2012  ? EPIGASTRIC HERNIA REPAIR N/A 08/13/2018  ? HERNIA REPAIR EPIGASTRIC ADULT; polypropylene mesh reinforcement.  Surgeon: ByRobert BellowMD;  Location: [facility] ORS;  Service: General;  Laterality: N/A;  ? RIGHT/LEFT HEART CATH AND CORONARY ANGIOGRAPHY N/A 07/11/2019  ? Procedure: RIGHT/LEFT HEART CATH AND CORONARY ANGIOGRAPHY;  Surgeon: CaYolonda KidaMD;  [facility];  Service: Cardiovascular;  Laterality: N/A;  ?  TONSILLECTOMY    ? TUBAL LIGATION    ? UMBILICAL HERNIA REPAIR N/A 08/13/2018  ? HERNIA REPAIR UMBILICAL ADULT; polypropylene mesh reinforcement Surgeon: Robert Bellow, Theresa [name];  Location: [facility] ORS;  Service: General;  Laterality: N/A;  ? ?Family History  ?Problem Relation Age of Onset  ? Heart disease Mother   ? Stroke Mother   ? Coronary artery disease Mother   ? Lung cancer Sister   ? Cancer Sister   ? Breast cancer Sister   ? Alcohol abuse Father   ? Heart disease Father   ? Cancer Brother   ? Cancer Brother    ? Pneumonia Brother   ? Prostate cancer Neg Hx   ? Kidney cancer Neg Hx   ? Bladder Cancer Neg Hx   ? ?Social History  ? ?Tobacco Use  ? Smoking status: Every Day  ?  Packs/day: 0.50  ?  Years: 44.00  ?  Pack years: 22.00  ?  Types: Cigarettes  ? Smokeless tobacco: Never  ? Tobacco comments:  ?  over a half pack  ?Substance Use Topics  ? Alcohol use: No  ? ?Allergies  ?Allergen Reactions  ? Codeine Nausea And Vomiting  ? Meloxicam Other (See Comments)  ?  Stomach pain  ? ?Prior to Admission medications   ?Medication Sig Start Date End Date Taking? Authorizing Provider  ?albuterol (VENTOLIN HFA) 108 (90 Base) MCG/ACT inhaler Inhale 2 puffs into the lungs every 6 (six) hours as needed for wheezing or shortness of breath. 10/23/19  Yes Theresa [name], [place], Theresa [name]  ?aspirin EC 81 MG tablet Take 81 mg by mouth daily.   Yes Provider, Historical, Theresa [name]  ?Cholecalciferol 50 MCG (2000 UT) CAPS Take 2,000 Units by mouth daily.    Yes Provider, Historical, Theresa [name]  ?Fluticasone-Umeclidin-Vilant 100-62.5-25 MCG/INH AEPB Inhale 1 puff into the lungs daily. 10/03/18  Yes Provider, Historical, Theresa [name]  ?HYDROcodone-acetaminophen (NORCO/VICODIN) 5-325 MG tablet Take 1 tablet by mouth every 8 (eight) hours as needed for severe pain. Must last 30 days 01/01/22 01/31/22 Yes Theresa Pointer, Theresa [name]  ?ipratropium-albuterol (DUONEB) 0.5-2.5 (3) MG/3ML SOLN USE 1 VIAL VIA NEBULIZER EVERY 6 HOURS AS NEEDED FOR SHORTNESS OF BREATH OR WHEEZING 11/30/20  Yes Theresa [name], Theresa Muff, Theresa [name]  ?levothyroxine (SYNTHROID) 25 MCG tablet TAKE 1 TABLET(25 MCG) BY MOUTH DAILY BEFORE BREAKFAST 01/03/22  Yes Theresa Sons, Theresa [name]  ?OXYGEN Inhale 2.5 L/min into the lungs.   Yes Provider, Historical, Theresa [name]  ?Roflumilast (DALIRESP) 250 MCG TABS Take 500 mcg by mouth daily as needed.   Yes Provider, Historical, Theresa [name]  ?sertraline (ZOLOFT) 100 MG tablet Take 2 tablets (200 mg total) by mouth every morning. 10/28/21  Yes Theresa Sons, Theresa [name]  ?tolterodine (DETROL LA) 2 MG 24 hr capsule TAKE 1  CAPSULE BY MOUTH ONCE DAILY 01/02/22  Yes Theresa Heys, Theresa [name]  ?torsemide (DEMADEX) 20 MG tablet Take 2 tablets (40 mg total) by mouth 2 (two) times daily. ?Patient taking differently: Take 40 mg by mouth 2 (two) times daily. 49m AM/ 473mPM 07/26/21  Yes HaAlisa GraffFNP  ?HYDROcodone-acetaminophen (NORCO/VICODIN) 5-325 MG tablet Take 1 tablet by mouth every 8 (eight) hours as needed for severe pain. Must last 30 days 11/02/21 12/02/21  NaMilinda PointerMD  ?HYDROcodone-acetaminophen (NORCO/VICODIN) 5-325 MG tablet Take 1 tablet by mouth every 8 (eight) hours as needed for severe pain. Must last 30 days 12/02/21 01/01/22  NaMilinda PointerMD  ?potassium chloride SA (KLOR-CON M) 20 MEQ tablet Take 1 tablet (20  mEq total) by mouth 2 (two) times daily. 01/06/22   [name], FNP  ? ? ?Review of Systems  ?Constitutional:  Positive for fatigue. Negative for appetite change.  ?Eyes: Negative.   ?Respiratory:  Positive for cough, shortness of breath (worsening) and wheezing (when walking).   ?Cardiovascular:  Positive for chest pain (at times). Negative for palpitations and leg swelling.  ?Gastrointestinal:  Negative for abdominal distention, abdominal pain and nausea.  ?Endocrine: Negative.   ?Genitourinary: Negative.   ?Musculoskeletal:  Positive for back pain.  ?Skin: Negative.   ?Allergic/Immunologic: Negative.   ?Neurological:  Negative for dizziness and light-headedness.  ?Hematological:  Negative for adenopathy. Does not bruise/bleed easily.  ?Psychiatric/Behavioral:  Positive for sleep disturbance (wearing oxygen & bipap at night). Negative for agitation, dysphoric mood and suicidal ideas. The patient is nervous/anxious.   ? ?Vitals:  ? 01/06/22 1039  ?BP: 121/69  ?Pulse: 91  ?Resp: 18  ?SpO2: 97%  ?Weight: 186 lb 8 oz (84.6 kg)  ?Height: _0  (1.575 m)  ? ?Wt Readings from Last 3 Encounters:  ?01/06/22 186 lb 8 oz (84.6 kg)  ?11/11/21 176 lb (79.8 kg)  ?10/27/21 176 lb (79.8 kg)  ? ?Lab Results   ?Component Value Date  ? CREATININE 0.76 08/20/2021  ? CREATININE 0.64 08/12/2021  ? CREATININE 0.63 08/11/2021  ? ? ?Physical Exam ?Vitals and nursing note reviewed. Exam conducted with a chaperone present

## 2022-01-11 DIAGNOSIS — H18413 Arcus senilis, bilateral: Secondary | ICD-10-CM | POA: Diagnosis not present

## 2022-01-11 DIAGNOSIS — Z961 Presence of intraocular lens: Secondary | ICD-10-CM | POA: Diagnosis not present

## 2022-01-11 DIAGNOSIS — H2511 Age-related nuclear cataract, right eye: Secondary | ICD-10-CM | POA: Diagnosis not present

## 2022-01-11 DIAGNOSIS — H25041 Posterior subcapsular polar age-related cataract, right eye: Secondary | ICD-10-CM | POA: Diagnosis not present

## 2022-01-14 ENCOUNTER — Ambulatory Visit (INDEPENDENT_AMBULATORY_CARE_PROVIDER_SITE_OTHER): Payer: Medicare HMO

## 2022-01-14 DIAGNOSIS — I5032 Chronic diastolic (congestive) heart failure: Secondary | ICD-10-CM

## 2022-01-14 DIAGNOSIS — J449 Chronic obstructive pulmonary disease, unspecified: Secondary | ICD-10-CM

## 2022-01-14 DIAGNOSIS — R0902 Hypoxemia: Secondary | ICD-10-CM

## 2022-01-14 DIAGNOSIS — I10 Essential (primary) hypertension: Secondary | ICD-10-CM

## 2022-01-14 NOTE — Chronic Care Management (AMB) (Signed)
Chronic Care Management   CCM RN Visit Note  01/14/2022 Name: Theresa [name] MRN: [id number] DOB: Dec 19, 1955  Subjective: Theresa [name] is a 66 y.o. year old female who is a primary care patient of Fisher, Demetrios Isaacs, MD. The care management team was consulted for assistance with disease management and care coordination needs.    Engaged with patient by telephone for follow up visit in response to provider referral for case management and care coordination services.   Consent to Services:  The patient was given information about Chronic Care Management services, agreed to services, and gave verbal consent prior to initiation of services.  Please see initial visit note for detailed documentation.    Assessment: Review of patient past medical history, allergies, medications, health status, including review of consultants reports, laboratory and other test data, was performed as part of comprehensive evaluation and provision of chronic care management services.   SDOH (Social Determinants of Health) assessments and interventions performed: No  CCM Care Plan  Allergies  Allergen Reactions   Codeine Nausea And Vomiting   Meloxicam Other (See Comments)    Stomach pain    Outpatient Encounter Medications as of 01/14/2022  Medication Sig Note   torsemide (DEMADEX) 20 MG tablet Take 2 tablets (40 mg total) by mouth 2 (two) times daily. (Patient taking differently: Take 40 mg by mouth 2 (two) times daily. 80mg  AM/ 40mg  PM)    albuterol (VENTOLIN HFA) 108 (90 Base) MCG/ACT inhaler Inhale 2 puffs into the lungs every 6 (six) hours as needed for wheezing or shortness of breath.    aspirin EC 81 MG tablet Take 81 mg by mouth daily.    Cholecalciferol 50 MCG (2000 UT) CAPS Take 2,000 Units by mouth daily.     Fluticasone-Umeclidin-Vilant 100-62.5-25 MCG/INH AEPB Inhale 1 puff into the lungs daily.    HYDROcodone-acetaminophen (NORCO/VICODIN) 5-325 MG tablet Take 1 tablet by mouth every 8 (eight)  hours as needed for severe pain. Must last 30 days    HYDROcodone-acetaminophen (NORCO/VICODIN) 5-325 MG tablet Take 1 tablet by mouth every 8 (eight) hours as needed for severe pain. Must last 30 days    HYDROcodone-acetaminophen (NORCO/VICODIN) 5-325 MG tablet Take 1 tablet by mouth every 8 (eight) hours as needed for severe pain. Must last 30 days 10/27/2021: WARNING: Not a Duplicate. Future prescription. DO NOT DELETE during hospital medication reconciliation or at discharge. [facility] Chronic Pain Management Patient    ipratropium-albuterol (DUONEB) 0.5-2.5 (3) MG/3ML SOLN USE 1 VIAL VIA NEBULIZER EVERY 6 HOURS AS NEEDED FOR SHORTNESS OF BREATH OR WHEEZING    levothyroxine (SYNTHROID) 25 MCG tablet TAKE 1 TABLET(25 MCG) BY MOUTH DAILY BEFORE BREAKFAST    OXYGEN Inhale 2.5 L/min into the lungs.    potassium chloride SA (KLOR-CON M) 20 MEQ tablet Take 2 tablets (40 mEq total) by mouth 2 (two) times daily. 01/14/2022: Reports dose changed. Reports taking in the morning and 20 mEq in the evening.   Roflumilast (DALIRESP) 250 MCG TABS Take 500 mcg by mouth daily as needed.    sertraline (ZOLOFT) 100 MG tablet Take 2 tablets (200 mg total) by mouth every morning.    tolterodine (DETROL LA) 2 MG 24 hr capsule TAKE 1 CAPSULE BY MOUTH ONCE DAILY    No facility-administered encounter medications on file as of 01/14/2022.    Patient Active Problem List   Diagnosis Date Noted   COPD exacerbation (HCC) 08/06/2021   Leukocytosis 08/06/2021   Encounter for tobacco use cessation counseling 08/06/2021  Chronic shoulder pain (Bilateral) 05/05/2021   Chronic use of opiate for therapeutic purpose 01/26/2021   Left thigh pain 10/28/2020   Uncomplicated opioid dependence (HCC) 04/08/2020   Pharmacologic therapy 04/08/2020   Numbness and tingling in right hand 12/31/2019   Numbness in feet 12/31/2019   Altered consciousness 11/26/2019   Frequent falls 11/26/2019   Weakness of both lower extremities  11/26/2019   Lumbar disc extrusion (L2-3) (Left) 09/10/2019   Chronic knee pain (Left) 08/14/2019   Chronic left-sided lumbar radiculopathy 08/14/2019   Abnormal MRI, lumbar spine (07/23/2019) 08/14/2019   Acute on chronic respiratory failure with hypoxia (HCC) 07/06/2019   Cervicalgia (Bilateral) (R>L) 09/24/2018   Spondylosis without myelopathy or radiculopathy, lumbosacral region 09/24/2018   Epigastric hernia 05/04/2018   Chronic lower extremity pain (Bilateral) (L>R) 01/25/2018   Altered mental status, unspecified 12/09/2017   Chronic hip pain (3ry area of Pain) (Bilateral) (L>R) 11/27/2017   COPD (chronic obstructive pulmonary disease) (HCC) 08/11/2017   Other long term (current) drug therapy 08/08/2017   Disorder of bone, unspecified 08/08/2017   COPD with hypoxia (HCC) 06/27/2017   Arthralgia of acromioclavicular joint (Right) 06/15/2017   Acromioclavicular joint DJD (Right) 06/15/2017   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   Chronic shoulder pain (Left) 05/11/2017   Elevated brain natriuretic peptide (BNP) level 05/11/2017   Grief at loss of child 02/14/2017   Lumbar facet hypertrophy (multilevel) 11/17/2016   Lumbar spinal stenosis (L4-5) 11/17/2016   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) 11/17/2016   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) 11/17/2016   Lumbosacral radiculopathy at L4 11/17/2016   Supplemental oxygen dependent 08/01/2016   Sleep apnea 06/02/2016   Long term current use of opiate analgesic 01/12/2016   Long term prescription opiate use 01/12/2016   Opiate use (15 MME/Day) 01/12/2016   Lumbar facet syndrome (Bilateral) (L>R) 01/12/2016   Chronic sacroiliac joint pain (Bilateral) 01/12/2016   Vitamin D deficiency 12/30/2015   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) 12/28/2015   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HCC) 11/19/2015   Coagulation disorder (HCC) 10/22/2015   Chronic pain syndrome (significant psychosocial  component) 09/21/2015   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 09/21/2015   Encounter for therapeutic drug level monitoring 09/16/2015   Encounter for chronic pain management 09/16/2015   Pain management 09/16/2015   Neurogenic pain 09/16/2015   Neuropathic pain 09/16/2015   Musculoskeletal pain 09/16/2015   Lumbar spondylosis (L4-5) 09/16/2015   Chronic low back pain (1ry area of Pain) (Bilateral) (L>R) w/o sciatica 09/16/2015   Chronic lower extremity pain (2ry area of Pain) (Left) 09/16/2015   Chronic lumbar radicular pain (L4 & S1 Dermatome) (Left) 09/16/2015   Chronic shoulder pain (Right side) 09/16/2015   Chronic upper extremity pain (Right-sided) 09/16/2015   Cervical radiculitis (Right side) 09/16/2015   Abnormal x-ray of lumbar spine 09/16/2015   Chronic diastolic heart failure (HCC) 07/15/2015   Chest pain 07/15/2015   Tobacco abuse 07/15/2015   Tendinitis 04/21/2015   Blood clotting disorder (HCC) 04/21/2015   Decreased motor strength 07/16/2014   Clinical depression 07/16/2014   Chronic rhinitis 07/16/2014   Carpal tunnel syndrome 07/16/2014   Anxiety state 07/16/2014   Nausea without vomiting 07/16/2014   Edema 07/16/2014   Generalized muscle weakness 07/16/2014   Neuritis or radiculitis due to rupture of lumbar intervertebral disc 07/02/2014   DDD (degenerative disc disease), lumbar 07/02/2014   CAFL (chronic airflow limitation) (HCC) 02/27/2014   Herpes zoster 07/16/2009   Essential (primary) hypertension  03/27/2008    Care Plan : RN Case Management Plan of Care       Problem: CHF, COPD, HLD, HTN      Long-Range Goal: Disease Progression Prevented or Minimized   Start Date: 12/14/2021  Expected End Date: 03/14/2022  Priority: High  Note:   Current Barriers:  Chronic Disease Management support and education needs related to CHF, HTN, HLD, and COPD   RNCM Clinical Goal(s):  Patient will demonstrate Improved adherence to  prescribed treatment plan for CHF, HTN, HLD, and COPD through collaboration with  the provider, RN Care manager and the care team.  Interventions: 1:1 collaboration with primary care provider regarding development and update of comprehensive plan of care as evidenced by provider attestation and co-signature Inter-disciplinary care team collaboration (see longitudinal plan of care) Evaluation of current treatment plan related to  self management and patient's adherence to plan as established by provider   Heart Failure Interventions:  (Status:  New goal.) Long Term Goal Wt Readings from Last 3 Encounters:  11/11/21 176 lb (79.8 kg)  10/27/21 176 lb (79.8 kg)  09/23/21 177 lb (80.3 kg)  Discussed current plan for CHF management.  Provided information regarding recommended weight parameters. Encouraged to weigh daily and record readings. Encouraged to notify provider for weight gain greater than 3 lbs overnight or weight gain greater than 5 lbs within a week.  Discussed s/sx of fluid overload and indications for notifying a provider. Encouraged to assess symptoms daily and contact clinic with concerns as needed. Discussed nutritional intake. Advised to closely monitor sodium consumption and avoid highly processed foods when possible. Discussed options for outreach with the Heart Failure/Community Paramedic. Expressed interest in receiving visits. Will request referral. Reviewed worsening s/sx related to CHF exacerbation and indications for seeking immediate medical attention.  COPD Interventions:  (Status:  New goal.) Long Term Goal Reviewed medications and COPD management.  Discussed triggers and concerns related to weather changes. Reports symptoms have been controlled. Reports they are usually triggered by heat. She tries to take needed precautions during the warmer months.  Discussed activity tolerance. Reports completing ADL's independently. She experiences shortness of breath with exertion  and prolonged standing or ambulation. She has an Higher education careers adviser. Reports maintaining oxygen saturations around 94% with O@ at 2.5 to 3L/min. Reviewed current action plan and reinforced importance of daily self-assessment. Advised to make an appointment with provider if experiencing moderate symptoms for greater than 48 hours without improvement.  Provided information regarding infection prevention and increased risk r/t COPD. Advised to utilize prevention strategies to reduce risk of respiratory infection  Reviewed worsening symptoms that require immediate medical attention.    Hyperlipidemia Interventions:  (Status:  New goal.) Long Term Goal Lab Results  Component Value Date   CHOL 241 (H) 06/10/2021   HDL 36 (L) 06/10/2021   LDLCALC 171 (H) 06/10/2021   TRIG 184 (H) 06/10/2021  Reviewed plan for hyperlipidemia management. Reports taking simvastatin as prescribed and tolerating well. Discussed importance of completing routine labs as ordered by provider. Discussed cardiac risk associated with hyperlipidemia. Discussed importance of limiting intake of foods high in cholesterol. Advised to read nutrition labels and avoid highly processed foods when possible. Discussed activity goals. Ability to exercise is limited d/t COPD. Advised to engage in low impact exercises and activities as tolerated  Hypertension Interventions:  (Status:  New goal.) Long Term Goal Last practice recorded BP readings:  BP Readings from Last 3 Encounters:  11/11/21 104/72  10/27/21 114/81  09/23/21 115/66  Most recent eGFR/CrCl:  Lab Results  Component Value Date   EGFR 87 08/20/2021    No components found for: CRCL  Reviewed treatment plan. Provided information regarding established blood pressure parameters along with indications for notifying a provider. Reports recent home readings remain within the established range.  Discussed compliance with recommended cardiac prudent diet.  Encouraged to read nutrition labels, monitor sodium intake, and avoid highly processed foods when possible. Reviewed s/sx of heart attack, stroke and worsening symptoms that require immediate medical attention.  Patient Goals/Self-Care Activities: Take all medications as prescribed Attend all scheduled provider appointments Call pharmacy for medication refills 3-7 days in advance of running out of medications Perform all self care activities independently  Call provider office for new concerns or questions    Follow Up Plan:   Will follow up next month     PLAN A member of the care management team will follow up next month.  [facility] Care Management [facility] [phone]

## 2022-01-17 DIAGNOSIS — R0602 Shortness of breath: Secondary | ICD-10-CM | POA: Diagnosis not present

## 2022-01-19 ENCOUNTER — Other Ambulatory Visit
Admission: RE | Admit: 2022-01-19 | Discharge: 2022-01-19 | Disposition: A | Discharge status: Home / Self Care | Payer: Medicare HMO | Source: Ambulatory Visit | Attending: Family | Admitting: Family

## 2022-01-19 DIAGNOSIS — I509 Heart failure, unspecified: Secondary | ICD-10-CM | POA: Insufficient documentation

## 2022-01-19 DIAGNOSIS — M65331 Trigger finger, right middle finger: Secondary | ICD-10-CM | POA: Diagnosis not present

## 2022-01-19 LAB — BASIC METABOLIC PANEL
Anion gap: 12 (ref 5–15)
BUN: 15 mg/dL (ref 8–23)
CO2: 29 mmol/L (ref 22–32)
Calcium: 10.1 mg/dL (ref 8.9–10.3)
Chloride: 101 mmol/L (ref 98–111)
Creatinine, Ser: 0.72 mg/dL (ref 0.44–1.00)
GFR, Estimated: >60 mL/min (ref >60)
Glucose, Bld: 109 mg/dL — ABNORMAL HIGH (ref 70–99)
Potassium: 3.5 mmol/L (ref 3.5–5.1)
Sodium: 142 mmol/L (ref 135–145)

## 2022-01-20 DIAGNOSIS — J9610 Chronic respiratory failure, unspecified whether with hypoxia or hypercapnia: Secondary | ICD-10-CM | POA: Diagnosis not present

## 2022-01-25 NOTE — Progress Notes (Signed)
PROVIDER NOTE: Information contained herein reflects review and annotations entered in association with encounter. Interpretation of such information and data should be left to medically-trained personnel. Information provided to patient can be located elsewhere in the medical record under "Patient Instructions". Document created using STT-dictation technology, any transcriptional errors that may result from process are unintentional.  ?  ?Patient: Theresa [name]  Service Category: E/M  Provider: Gaspar Cola, MD  ?DOB: 14-Mar-1956  DOS: 01/26/2022  Specialty: Interventional Pain Management  ?MRN: [id number]  Setting: Ambulatory outpatient  PCP: Birdie Sons, MD  ?Type: Established Patient    Referring Provider: Birdie Sons, MD  ?Location: Office  Delivery: Face-to-face    ? ?HPI  ?Theresa [name], a 66 y.o. year old female, is here today because of her Chronic pain syndrome [G89.4]. Theresa [name] primary complain today is Back Pain (Bilateral lumbar usually right is worse.) ?Last encounter: My last encounter with her was on 11/25/2021. ?Pertinent problems: Theresa [name] has Tendinitis; Neuritis or radiculitis due to rupture of lumbar intervertebral disc; Decreased motor strength; DDD (degenerative disc disease), lumbar; Carpal tunnel syndrome; Neurogenic pain; Neuropathic pain; Musculoskeletal pain; Lumbar spondylosis (L4-5); Chronic low back pain (1ry area of Pain) (Bilateral) (L>R) w/o sciatica; Chronic lower extremity pain (2ry area of Pain) (Left); Chronic lumbar radicular pain (L4 & S1 Dermatome) (Left); Chronic shoulder pain (Right side); Chronic upper extremity pain (Right-sided); Cervical radiculitis (Right side); Abnormal x-ray of lumbar spine; Chronic pain syndrome (significant psychosocial component); 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; 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); Lumbar facet syndrome (Bilateral) (L>R); Chronic sacroiliac joint pain (Bilateral);  Lumbar facet hypertrophy (multilevel); Lumbar spinal stenosis (L4-5); 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); 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); Lumbosacral radiculopathy at L4; Chronic shoulder pain (Left); Arthralgia of acromioclavicular joint (Right); Acromioclavicular joint DJD (Right); Osteoarthritis of shoulder (Right); Chronic hip pain (3ry area of Pain) (Bilateral) (L>R); Chronic lower extremity pain (Bilateral) (L>R); Cervicalgia (Bilateral) (R>L); Spondylosis without myelopathy or radiculopathy, lumbosacral region; Chronic knee pain (Left); Chronic left-sided lumbar radiculopathy; Abnormal MRI, lumbar spine (07/23/2019); 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 ?Pain Assessment: Severity of Chronic pain is reported as a 4 /10. Location: Back Lower, Left, Right/into buttocks and into thigh on the right. Onset: More than a month ago. Quality: Discomfort, Constant, Shooting. Timing: Constant. Modifying factor(s): standing and changing weight baring to different leg. ?Vitals:  height is _0  (1.575 m) and weight is 177 lb (80.3 kg). Her temporal temperature is 97.2 ?F (36.2 ?C) (abnormal). Her blood pressure is 105/77 and her pulse is 104 (abnormal). Her respiration is 16 and oxygen saturation is 94%.  ? ?Reason for encounter: medication management.   The patient indicates doing well with the current medication regimen. No adverse reactions or side effects reported to the medications.  ? ?Today we went over the results of her recent lumbar MRI.  I have explained those to the patient in layman's terms.  Although it indicates that she could be having some nerve compression/impingement at several locations, she refers that currently she is only experiencing some discomfort in the  buttocks area and perhaps going down to mid hamstring over the back of the leg, but not reaching the knee.  I have instructed the patient to give [place] a call and let me know  if she starts experiencing pain all the way down into her foot in which case then we will bring her back in perhaps treated with some epidural steroid injections.  He understood and accepted. ? ?RTCB: 05/01/2022 ?Nonopioids transferred 08/03/2020: Gabapentin and baclofen ? ?Pharmacotherapy Assessment  ?Analgesic: Hydrocodone/APAP 5/325 one tablet every 8 hours (15 mg/day of hydrocodone) ?MME/day: 15 mg/day.  ? ?Monitoring: ?[place] PMP: PDMP reviewed during this encounter.       ?Pharmacotherapy: No side-effects or adverse reactions reported. ?Compliance: No problems identified. ?Effectiveness: Clinically acceptable. ? ?Janett Billow, RN  01/26/2022 11:25 AM  Sign when Signing Visit ?Nursing Pain Medication Assessment:  ?Safety precautions to be maintained throughout the outpatient stay will include: orient to surroundings, keep bed in low position, maintain call bell within reach at all times, provide assistance with transfer out of bed and ambulation.  ?Medication Inspection Compliance: Pill count conducted under aseptic conditions, in front of the patient. Neither the pills nor the bottle was removed from the patient's sight at any time. Once count was completed pills were immediately returned to the patient in their original bottle. ? ?Medication: Hydrocodone/APAP ?Pill/Patch Count:  23 of 90 pills remain ?Pill/Patch Appearance: Markings consistent with prescribed medication ?Bottle Appearance: Standard pharmacy container. Clearly labeled. ?Filled Date: 03 / 18 / 2023 ?Last Medication intake:  Today ?   UDS:  ?Summary  ?Date Value Ref Range Status  ?10/27/2021 Note  Final  ?  Comment:  ?  ==================================================================== ?[employer] Select 13  (MW) ?==================================================================== ?Test                             Result       Flag       Units ? ?Drug Present and Declared for Prescription Verification ?  Hydrocodone                    653          EXPECTED   ng/mg creat ?  Hydromorphone                  76           EXPECTED   ng/mg creat ?  Dihydrocodeine                 125          EXPECTED   ng/mg creat ?  Norhydrocodone                 625          EXPECTED   ng/mg creat ?   Sources of hydrocodone include scheduled prescription medications. ?   Hydromorphone, dihydrocodeine and norhydrocodone are expected ?   metabolites of hydrocodone. Hydromorphone and dihydrocodeine are ?   also available as scheduled prescription medications. ? ?==================================================================== ?Test                      Result    Flag   Units      Ref Range ?  Creatinine              72               mg/dL      >=20 ?==================================================================== ?Declared Medications: ? The flagging and interpretation on this report are based on the ? following declared medications.  Unexpected results may arise from ? inaccuracies in the declared medications. ? ? **  Note: The testing scope of this panel includes these medications: ? ? Hydrocodone (Norco) ? ? **Note: The testing scope of this panel does not include the ? following reported medications: ? ? Acetaminophen (Norco) ? Albuterol (Ventolin HFA) ? Albuterol (Duoneb) ? Aspirin ? Cholecalciferol ? Fluticasone ? Ipratropium (Duoneb) ? Levothyroxine (Synthroid) ? Potassium (Klor-Con) ? Roflumilast (Daliresp) ? Sertraline (Zoloft) ? Simvastatin (Zocor) ? Torsemide (Demadex) ? Umeclidinium ? Vilanterol ?==================================================================== ?For clinical consultation, please call [phone]. ?==================================================================== ?  ?  ? ?ROS  ?Constitutional: Denies  any fever or chills ?Gastrointestinal: No reported hemesis, hematochezia, vomiting, or acute GI distress ?Musculoskeletal: Denies any acute onset joint swelling, redness, loss of ROM, or weakness ?Neurological: No reported episodes of acute onset apraxia, aphasia, dysarthria

## 2022-01-26 ENCOUNTER — Encounter: Payer: Self-pay | Admitting: Pain Medicine

## 2022-01-26 ENCOUNTER — Ambulatory Visit: Payer: Medicare HMO | Attending: Pain Medicine | Admitting: Pain Medicine

## 2022-01-26 ENCOUNTER — Other Ambulatory Visit: Payer: Self-pay

## 2022-01-26 VITALS — BP 105/77 | HR 104 | Temp 97.2°F | Resp 16 | Ht 62.0 in | Wt 177.0 lb

## 2022-01-26 DIAGNOSIS — M79605 Pain in left leg: Secondary | ICD-10-CM | POA: Diagnosis not present

## 2022-01-26 DIAGNOSIS — M25551 Pain in right hip: Secondary | ICD-10-CM | POA: Insufficient documentation

## 2022-01-26 DIAGNOSIS — M47816 Spondylosis without myelopathy or radiculopathy, lumbar region: Secondary | ICD-10-CM | POA: Diagnosis not present

## 2022-01-26 DIAGNOSIS — M5136 Other intervertebral disc degeneration, lumbar region: Secondary | ICD-10-CM | POA: Diagnosis not present

## 2022-01-26 DIAGNOSIS — M79604 Pain in right leg: Secondary | ICD-10-CM | POA: Diagnosis not present

## 2022-01-26 DIAGNOSIS — Z79891 Long term (current) use of opiate analgesic: Secondary | ICD-10-CM | POA: Insufficient documentation

## 2022-01-26 DIAGNOSIS — G8929 Other chronic pain: Secondary | ICD-10-CM | POA: Insufficient documentation

## 2022-01-26 DIAGNOSIS — M25562 Pain in left knee: Secondary | ICD-10-CM | POA: Insufficient documentation

## 2022-01-26 DIAGNOSIS — G894 Chronic pain syndrome: Secondary | ICD-10-CM | POA: Diagnosis not present

## 2022-01-26 DIAGNOSIS — M25552 Pain in left hip: Secondary | ICD-10-CM | POA: Diagnosis not present

## 2022-01-26 DIAGNOSIS — M545 Low back pain, unspecified: Secondary | ICD-10-CM | POA: Insufficient documentation

## 2022-01-26 DIAGNOSIS — Z79899 Other long term (current) drug therapy: Secondary | ICD-10-CM | POA: Diagnosis not present

## 2022-01-26 MED ORDER — HYDROCODONE-ACETAMINOPHEN 5-325 MG PO TABS: 1.0000 | ORAL_TABLET | Freq: Three times a day (TID) | ORAL | 0 refills | Status: DC | PRN

## 2022-01-26 NOTE — Patient Instructions (Signed)
____________________________________________________________________________________________ ? ?Muscle Spasms & Cramps ? ?Cause:  ?The most common cause of muscle spasms and cramps is vitamin and/or electrolyte (calcium, potassium, sodium, etc.) deficiencies. ? ?Possible triggers: ?Sweating - causes loss of electrolytes thru the skin. ?Steroids - causes loss of electrolytes thru the urine. ? ?Treatment: ?Gatorade (or any other electrolyte-replenishing drink) - Take 1, 8 oz glass with each meal (3 times a day). ?OTC (over-the-counter) Magnesium 400 to 500 mg - Take 1 tablet twice a day (one with breakfast and one before bedtime). If you have kidney problems, talk to your primary care physician before taking any Magnesium. ?Tonic Water with quinine - Take 1, 8 oz glass before bedtime.  ? ?____________________________________________________________________________________________ ? ____________________________________________________________________________________________ ? ?Medication Rules ? ?Purpose: To inform patients, and their family members, of our rules and regulations. ? ?Applies to: All patients receiving prescriptions (written or electronic). ? ?Pharmacy of record: Pharmacy where electronic prescriptions will be sent. If written prescriptions are taken to a different pharmacy, please inform the nursing staff. The pharmacy listed in the electronic medical record should be the one where you would like electronic prescriptions to be sent. ? ?Electronic prescriptions: In compliance with the [place] (STOP) Act of 2017 (Session Lanny Cramp [phone]), effective October 17, 2018, all controlled substances must be electronically prescribed. Calling prescriptions to the pharmacy will cease to exist. ? ?Prescription refills: Only during scheduled appointments. Applies to all prescriptions. ? ?NOTE: The following applies primarily to controlled substances (Opioid* Pain  Medications).  ? ?Type of encounter (visit): For patients receiving controlled substances, face-to-face visits are required. (Not an option or up to the patient.) ? ?Patient's responsibilities: ?Pain Pills: Bring all pain pills to every appointment (except for procedure appointments). ?Pill Bottles: Bring pills in original pharmacy bottle. Always bring the newest bottle. Bring bottle, even if empty. ?Medication refills: You are responsible for knowing and keeping track of what medications you take and those you need refilled. ?The day before your appointment: write a list of all prescriptions that need to be refilled. ?The day of the appointment: give the list to the admitting nurse. Prescriptions will be written only during appointments. No prescriptions will be written on procedure days. ?If you forget a medication: it will not be "Called in", "Faxed", or "electronically sent". You will need to get another appointment to get these prescribed. ?No early refills. Do not call asking to have your prescription filled early. ?Prescription Accuracy: You are responsible for carefully inspecting your prescriptions before leaving our office. Have the discharge nurse carefully go over each prescription with you, before taking them home. Make sure that your name is accurately spelled, that your address is correct. Check the name and dose of your medication to make sure it is accurate. Check the number of pills, and the written instructions to make sure they are clear and accurate. Make sure that you are given enough medication to last until your next medication refill appointment. ?Taking Medication: Take medication as prescribed. When it comes to controlled substances, taking less pills or less frequently than prescribed is permitted and encouraged. ?Never take more pills than instructed. ?Never take medication more frequently than prescribed.  ?Inform other Doctors: Always inform, all of your healthcare providers, of all  the medications you take. ?Pain Medication from other Providers: You are not allowed to accept any additional pain medication from any other Doctor or Healthcare provider. There are two exceptions to this rule. (see below) In the event that you require additional  pain medication, you are responsible for notifying us, as stated below. ?Cough Medicine: Often these contain an opioid, such as codeine or hydrocodone. Never accept or take cough medicine containing these opioids if you are already taking an opioid* medication. The combination may cause respiratory failure and death. ?Medication Agreement: You are responsible for carefully reading and following our Medication Agreement. This must be signed before receiving any prescriptions from our practice. Safely store a copy of your signed Agreement. Violations to the Agreement will result in no further prescriptions. (Additional copies of our Medication Agreement are available upon request.) ?Laws, Rules, & Regulations: All patients are expected to follow all Federal and [employer], Rules, [employer]. Ignorance of the Laws does not constitute a valid excuse.  ?Illegal drugs and Controlled Substances: The use of illegal substances (including, but not limited to marijuana and its derivatives) and/or the illegal use of any controlled substances is strictly prohibited. Violation of this rule may result in the immediate and permanent discontinuation of any and all prescriptions being written by our practice. The use of any illegal substances is prohibited. ?Adopted CDC guidelines & recommendations: Target dosing levels will be at or below 60 MME/day. Use of benzodiazepines** is not recommended. ? ?Exceptions: There are only two exceptions to the rule of not receiving pain medications from other Healthcare Providers. ?Exception #1 (Emergencies): In the event of an emergency (i.e.: accident requiring emergency care), you are allowed to receive additional pain  medication. However, you are responsible for: As soon as you are able, call our office [phone], at any time of the day or night, and leave a message stating your name, the date and nature of the emergency, and the name and dose of the medication prescribed. In the event that your call is answered by a member of our staff, make sure to document and save the date, time, and the name of the person that took your information.  ?Exception #2 (Planned Surgery): In the event that you are scheduled by another doctor or dentist to have any type of surgery or procedure, you are allowed (for a period no longer than 30 days), to receive additional pain medication, for the acute post-op pain. However, in this case, you are responsible for picking up a copy of our "Post-op Pain Management for Surgeons" handout, and giving it to your surgeon or dentist. This document is available at our office, and does not require an appointment to obtain it. Simply go to our office during business hours (Monday-Thursday from 8:00 AM to 4:00 PM) (Friday 8:00 AM to 12:00 Noon) or if you have a scheduled appointment with [place], prior to your surgery, and ask for it by name. In addition, you are responsible for: calling our office [phone], at any time of the day or night, and leaving a message stating your name, name of your surgeon, type of surgery, and date of procedure or surgery. Failure to comply with your responsibilities may result in termination of therapy involving the controlled substances. ?Medication Agreement Violation. Following the above rules, including your responsibilities will help you in avoiding a Medication Agreement Violation (?Breaking your Pain Medication Contract?). ? ?*Opioid medications include: morphine, codeine, oxycodone, oxymorphone, hydrocodone, hydromorphone, meperidine, tramadol, tapentadol, buprenorphine, fentanyl, methadone. ?**Benzodiazepine medications include: diazepam (Valium), alprazolam (Xanax),  clonazepam (Klonopine), lorazepam (Ativan), clorazepate (Tranxene), chlordiazepoxide (Librium), estazolam (Prosom), oxazepam (Serax), temazepam (Restoril), triazolam (Halcion) ?(Last updated: 07/14/2021) ?___________

## 2022-01-26 NOTE — Progress Notes (Signed)
Nursing Pain Medication Assessment:  ?Safety precautions to be maintained throughout the outpatient stay will include: orient to surroundings, keep bed in low position, maintain call bell within reach at all times, provide assistance with transfer out of bed and ambulation.  ?Medication Inspection Compliance: Pill count conducted under aseptic conditions, in front of the patient. Neither the pills nor the bottle was removed from the patient's sight at any time. Once count was completed pills were immediately returned to the patient in their original bottle. ? ?Medication: Hydrocodone/APAP ?Pill/Patch Count:  23 of 90 pills remain ?Pill/Patch Appearance: Markings consistent with prescribed medication ?Bottle Appearance: Standard pharmacy container. Clearly labeled. ?Filled Date: 03 / 18 / 2023 ?Last Medication intake:  Today ?

## 2022-02-01 ENCOUNTER — Encounter: Payer: Self-pay | Admitting: Family

## 2022-02-01 ENCOUNTER — Ambulatory Visit (HOSPITAL_BASED_OUTPATIENT_CLINIC_OR_DEPARTMENT_OTHER): Payer: Medicare HMO | Admitting: Family

## 2022-02-01 ENCOUNTER — Other Ambulatory Visit
Admission: RE | Admit: 2022-02-01 | Discharge: 2022-02-01 | Disposition: A | Discharge status: Home / Self Care | Payer: Medicare HMO | Source: Ambulatory Visit | Attending: Family | Admitting: Family

## 2022-02-01 VITALS — BP 112/78 | HR 97 | Resp 16 | Ht 62.0 in | Wt 189.2 lb

## 2022-02-01 DIAGNOSIS — F32A Depression, unspecified: Secondary | ICD-10-CM | POA: Insufficient documentation

## 2022-02-01 DIAGNOSIS — R609 Edema, unspecified: Secondary | ICD-10-CM | POA: Diagnosis not present

## 2022-02-01 DIAGNOSIS — F419 Anxiety disorder, unspecified: Secondary | ICD-10-CM | POA: Insufficient documentation

## 2022-02-01 DIAGNOSIS — J449 Chronic obstructive pulmonary disease, unspecified: Secondary | ICD-10-CM

## 2022-02-01 DIAGNOSIS — Z720 Tobacco use: Secondary | ICD-10-CM | POA: Diagnosis not present

## 2022-02-01 DIAGNOSIS — J441 Chronic obstructive pulmonary disease with (acute) exacerbation: Secondary | ICD-10-CM | POA: Insufficient documentation

## 2022-02-01 DIAGNOSIS — I208 Other forms of angina pectoris: Secondary | ICD-10-CM | POA: Diagnosis not present

## 2022-02-01 DIAGNOSIS — I5032 Chronic diastolic (congestive) heart failure: Secondary | ICD-10-CM | POA: Insufficient documentation

## 2022-02-01 DIAGNOSIS — F17210 Nicotine dependence, cigarettes, uncomplicated: Secondary | ICD-10-CM | POA: Insufficient documentation

## 2022-02-01 DIAGNOSIS — J439 Emphysema, unspecified: Secondary | ICD-10-CM | POA: Diagnosis not present

## 2022-02-01 DIAGNOSIS — I272 Pulmonary hypertension, unspecified: Secondary | ICD-10-CM | POA: Diagnosis not present

## 2022-02-01 DIAGNOSIS — Z79899 Other long term (current) drug therapy: Secondary | ICD-10-CM | POA: Insufficient documentation

## 2022-02-01 DIAGNOSIS — R0602 Shortness of breath: Secondary | ICD-10-CM | POA: Diagnosis not present

## 2022-02-01 DIAGNOSIS — I110 Hypertensive heart disease with heart failure: Secondary | ICD-10-CM | POA: Insufficient documentation

## 2022-02-01 DIAGNOSIS — Z9981 Dependence on supplemental oxygen: Secondary | ICD-10-CM | POA: Insufficient documentation

## 2022-02-01 DIAGNOSIS — G4733 Obstructive sleep apnea (adult) (pediatric): Secondary | ICD-10-CM | POA: Diagnosis not present

## 2022-02-01 DIAGNOSIS — I10 Essential (primary) hypertension: Secondary | ICD-10-CM | POA: Diagnosis not present

## 2022-02-01 DIAGNOSIS — I2510 Atherosclerotic heart disease of native coronary artery without angina pectoris: Secondary | ICD-10-CM | POA: Diagnosis not present

## 2022-02-01 DIAGNOSIS — K219 Gastro-esophageal reflux disease without esophagitis: Secondary | ICD-10-CM | POA: Diagnosis not present

## 2022-02-01 LAB — BASIC METABOLIC PANEL
Anion gap: 12 (ref 5–15)
BUN: 18 mg/dL (ref 8–23)
CO2: 31 mmol/L (ref 22–32)
Calcium: 10.2 mg/dL (ref 8.9–10.3)
Chloride: 98 mmol/L (ref 98–111)
Creatinine, Ser: 0.77 mg/dL (ref 0.44–1.00)
GFR, Estimated: >60 mL/min (ref >60)
Glucose, Bld: 109 mg/dL — ABNORMAL HIGH (ref 70–99)
Potassium: 3.5 mmol/L (ref 3.5–5.1)
Sodium: 141 mmol/L (ref 135–145)

## 2022-02-01 NOTE — Patient Instructions (Signed)
Continue weighing daily and call for an overnight weight gain of 3 pounds or more or a weekly weight gain of more than 5 pounds.   If you have voicemail, please make sure your mailbox is cleaned out so that we may leave a message and please make sure to listen to any voicemails.     

## 2022-02-01 NOTE — Progress Notes (Addendum)
? Patient ID: Theresa [name], female    DOB: August 11, 1956, 66 y.o.   MRN: [id number] ? ?HPI ?Theresa [name] is a 66 y/o female with a history of chronic low back pain, HTN, depression, COPD on long-term oxygen, anxiety, carpal tunnel syndrome, long-standing tobacco use and chronic heart failure. ? ?Echo report from 08/08/21 reviewed and showed an EF of 60-65% along with mild LAE. Echo report from 09/03/20 reviewed and showed an EF of >55% with mild LAE and severe TR. Echo report from 07/07/2019 reviewed and showed an EF of 60-65% along with mild TR and moderately elevated PA pressure. Echo done 11/03/15 but unable to access results. Echo done on 02/19/15 showed an EF of 50% without valvular stenosis.  ? ?Catheterization done 07/11/2019 showed:  ?Prox RCA to Dist RCA lesion is 50% stenosed. ?Mid LAD lesion is 50% stenosed. ?Ost LM to Dist LM lesion is 10% stenosed. ?Hemodynamic findings consistent with moderate pulmonary hypertension. ?Normal overall left ventricular function of at least 60% ?Moderate coronary disease no significant obstructive disease ?Moderate pulmonary hypertension ? ?Admitted 08/06/21 due to COPD exacerbation. Given IV antibiotics, systemic steroid and nebulizers. Hypokalemia corrected. PT/OT evaluations done. Discharged after 6 days.  ? ?She presents today for a follow-up visit with a chief complaint of moderate shortness of breath with minimal exertion. She describes this as chronic in nature having been present for several years although she does feel like it has been worsening recently. She has associated fatigue, cough, wheezing, chronic back pain, difficulty sleeping and slight weight gain along with this. She denies any abdominal distention, palpitations, pedal edema, chest pain or dizziness.  ? ?Is waiting to start on ambrisentan through pulmonology. Signed her paperwork last week. Had been treated for ~ 1 month with steroids and says that she was eating "everything" and gained 12 pounds over that  month.  ? ?Will be having surgery on a trigger finger on her right hand on 02/15/22 ? ?Potassium level has normalized after increasing dose to 97mq AM/ 238m PM ? ?Past Medical History:  ?Diagnosis Date  ? Acute postoperative pain 10/03/2017  ? Anxiety   ? BiPAP (biphasic positive airway pressure) dependence   ? at hs  ? Carpal tunnel syndrome   ? CHF (congestive heart failure) ([facility]  ? 1/18  ? [place]congestive [place] failure) [place]  ? Chronic generalized abdominal pain   ? Chronic rhinitis   ? COPD (chronic obstructive pulmonary disease) ([place]  ? DDD (degenerative disc disease), cervical   ? DDD (degenerative disc disease), lumbosacral   ? Depression   ? Dyspnea   ? Edema   ? Flu   ? 1/18  ? Gastritis   ? GERD (gastroesophageal reflux disease)   ? Hematuria   ? Hernia of abdominal wall 07/17/2018  ? Hernia, abdominal   ? Hypertension   ? Kidney stones   ? Low back pain   ? Lumbar radiculitis   ? Malodorous urine   ? Muscle weakness   ? Obesity   ? Oxygen dependent   ? Pneumonia due to COVID-19 virus 02/03/2020  ? Renal cyst   ? Sensory urge incontinence   ? Thyroid activity decreased   ? 9/19  ? Tobacco abuse   ? Wheezing   ? ?Past Surgical History:  ?Procedure Laterality Date  ? ABDOMINAL HYSTERECTOMY    ? CARPAL TUNNEL RELEASE Left 2012  ? EPIGASTRIC HERNIA REPAIR N/A 08/13/2018  ? HERNIA REPAIR EPIGASTRIC ADULT; polypropylene mesh reinforcement.  Surgeon:  Robert Bellow, MD;  Location: [facility];  Service: General;  Laterality: N/A;  ? RIGHT/LEFT HEART CATH AND CORONARY ANGIOGRAPHY N/A 07/11/2019  ? Procedure: RIGHT/LEFT HEART CATH AND CORONARY ANGIOGRAPHY;  Surgeon: Yolonda Kida, MD;  Location: [place] [facility];  Service: Cardiovascular;  Laterality: N/A;  ? TONSILLECTOMY    ? TUBAL LIGATION    ? UMBILICAL HERNIA REPAIR N/A 08/13/2018  ? HERNIA REPAIR UMBILICAL ADULT; polypropylene mesh reinforcement Surgeon: Robert Bellow, MD;  Location: [facility];  Service: General;  Laterality: N/A;  ? ?Family  History  ?Problem Relation Age of Onset  ? Heart disease Mother   ? Stroke Mother   ? Coronary artery disease Mother   ? Lung cancer Sister   ? Cancer Sister   ? Breast cancer Sister   ? Alcohol abuse Father   ? Heart disease Father   ? Cancer Brother   ? Cancer Brother   ? Pneumonia Brother   ? Prostate cancer Neg Hx   ? Kidney cancer Neg Hx   ? Bladder Cancer Neg Hx   ? ?Social History  ? ?Tobacco Use  ? Smoking status: Every Day  ?  Packs/day: 0.50  ?  Years: 44.00  ?  Pack years: 22.00  ?  Types: Cigarettes  ? Smokeless tobacco: Never  ? Tobacco comments:  ?  over a half pack  ?Substance Use Topics  ? Alcohol use: No  ? ?Allergies  ?Allergen Reactions  ? Codeine Nausea And Vomiting  ? Meloxicam Other (See Comments)  ?  Stomach pain  ? ?Prior to Admission medications   ?Medication Sig Start Date End Date Taking? Authorizing Provider  ?albuterol (VENTOLIN HFA) 108 (90 Base) MCG/ACT inhaler Inhale 2 puffs into the lungs every 6 (six) hours as needed for wheezing or shortness of breath. 10/23/19  Yes Veronese, [place], MD  ?aspirin EC 81 MG tablet Take 81 mg by mouth daily.   Yes [provider]  ?Cholecalciferol 50 MCG (2000 UT) CAPS Take 2,000 Units by mouth daily.    Yes [provider]  ?Fluticasone-Umeclidin-Vilant 100-62.5-25 MCG/INH AEPB Inhale 1 puff into the lungs daily. 10/03/18  Yes [provider]  ?HYDROcodone-acetaminophen (NORCO/VICODIN) 5-325 MG tablet Take 1 tablet by mouth every 8 (eight) hours as needed for severe pain. Must last 30 days 01/31/22 03/02/22 Yes Milinda Pointer, MD  ?ipratropium-albuterol (DUONEB) 0.5-2.5 (3) MG/3ML SOLN USE 1 VIAL VIA NEBULIZER EVERY 6 HOURS AS NEEDED FOR SHORTNESS OF BREATH OR WHEEZING 11/30/20  Yes Chrismon, Vickki Muff, PA-C  ?levothyroxine (SYNTHROID) 25 MCG tablet TAKE 1 TABLET(25 MCG) BY MOUTH DAILY BEFORE BREAKFAST 01/03/22  Yes Birdie Sons, MD  ?OXYGEN Inhale 2.5 L/min into the lungs.   Yes [provider]  ?potassium  chloride SA (KLOR-CON M) 20 MEQ tablet Take 2 tablets (40 mEq total) by mouth 2 (two) times daily. 01/06/22  Yes [name], FNP  ?Roflumilast (DALIRESP) 250 MCG TABS Take 500 mcg by mouth daily as needed.   Yes [provider]  ?sertraline (ZOLOFT) 100 MG tablet Take 2 tablets (200 mg total) by mouth every morning. 10/28/21  Yes Birdie Sons, MD  ?torsemide (DEMADEX) 20 MG tablet Take 2 tablets (40 mg total) by mouth 2 (two) times daily. ?Patient taking differently: Take 40 mg by mouth 2 (two) times daily. 45m AM/ 476mPM 07/26/21  Yes Lianny Molter, TiOtila Kluver, FNP  ?ambrisentan (LETAIRIS) 5 MG tablet Take 5 mg by mouth daily. ?Patient not taking:  Reported on 02/01/2022 01/13/22   [provider]  ?HYDROcodone-acetaminophen (NORCO/VICODIN) 5-325 MG tablet Take 1 tablet by mouth every 8 (eight) hours as needed for severe pain. Must last 30 days ?Patient not taking: Reported on 02/01/2022 03/02/22 04/01/22  Milinda Pointer, MD  ?HYDROcodone-acetaminophen (NORCO/VICODIN) 5-325 MG tablet Take 1 tablet by mouth every 8 (eight) hours as needed for severe pain. Must last 30 days ?Patient not taking: Reported on 02/01/2022 04/01/22 05/01/22  Milinda Pointer, MD  ? ?Review of Systems  ?Constitutional:  Positive for fatigue. Negative for appetite change.  ?Eyes: Negative.   ?Respiratory:  Positive for cough, shortness of breath (worsening) and wheezing (when walking).   ?Cardiovascular:  Negative for chest pain, palpitations and leg swelling.  ?Gastrointestinal:  Negative for abdominal distention, abdominal pain and nausea.  ?Endocrine: Negative.   ?Genitourinary: Negative.   ?Musculoskeletal:  Positive for back pain.  ?Skin: Negative.   ?Allergic/Immunologic: Negative.   ?Neurological:  Negative for dizziness and light-headedness.  ?Hematological:  Negative for adenopathy. Does not bruise/bleed easily.  ?Psychiatric/Behavioral:  Positive for sleep disturbance (wearing oxygen & bipap at night). Negative for  agitation, dysphoric mood and suicidal ideas. The patient is not nervous/anxious.   ? ?Vitals:  ? 02/01/22 0927  ?BP: 112/78  ?Pulse: 97  ?Resp: 16  ?SpO2: 93%  ?Weight: 189 lb 4 oz (85.8 kg)  ?Height: _0

## 2022-02-06 DIAGNOSIS — J449 Chronic obstructive pulmonary disease, unspecified: Secondary | ICD-10-CM | POA: Diagnosis not present

## 2022-02-08 DIAGNOSIS — Z9981 Dependence on supplemental oxygen: Secondary | ICD-10-CM | POA: Diagnosis not present

## 2022-02-08 DIAGNOSIS — G4733 Obstructive sleep apnea (adult) (pediatric): Secondary | ICD-10-CM | POA: Diagnosis not present

## 2022-02-08 DIAGNOSIS — I272 Pulmonary hypertension, unspecified: Secondary | ICD-10-CM | POA: Diagnosis not present

## 2022-02-08 DIAGNOSIS — J449 Chronic obstructive pulmonary disease, unspecified: Secondary | ICD-10-CM | POA: Diagnosis not present

## 2022-02-08 DIAGNOSIS — R69 Illness, unspecified: Secondary | ICD-10-CM | POA: Diagnosis not present

## 2022-02-08 DIAGNOSIS — R0609 Other forms of dyspnea: Secondary | ICD-10-CM | POA: Diagnosis not present

## 2022-02-08 DIAGNOSIS — F17218 Nicotine dependence, cigarettes, with other nicotine-induced disorders: Secondary | ICD-10-CM | POA: Diagnosis not present

## 2022-02-11 ENCOUNTER — Telehealth: Payer: Medicare HMO

## 2022-02-14 DIAGNOSIS — M65331 Trigger finger, right middle finger: Secondary | ICD-10-CM | POA: Diagnosis not present

## 2022-02-18 IMAGING — CR DG CHEST 2V
2 series · 2 of 2 positions shown · non-contrast
Comparison: Chest x-ray 06/23/2021, CT chest 06/23/2021.

CLINICAL DATA: Dyspnea.  Productive cough.  COPD.  CHF.

EXAM:
CHEST - 2 VIEW

[chest pa]
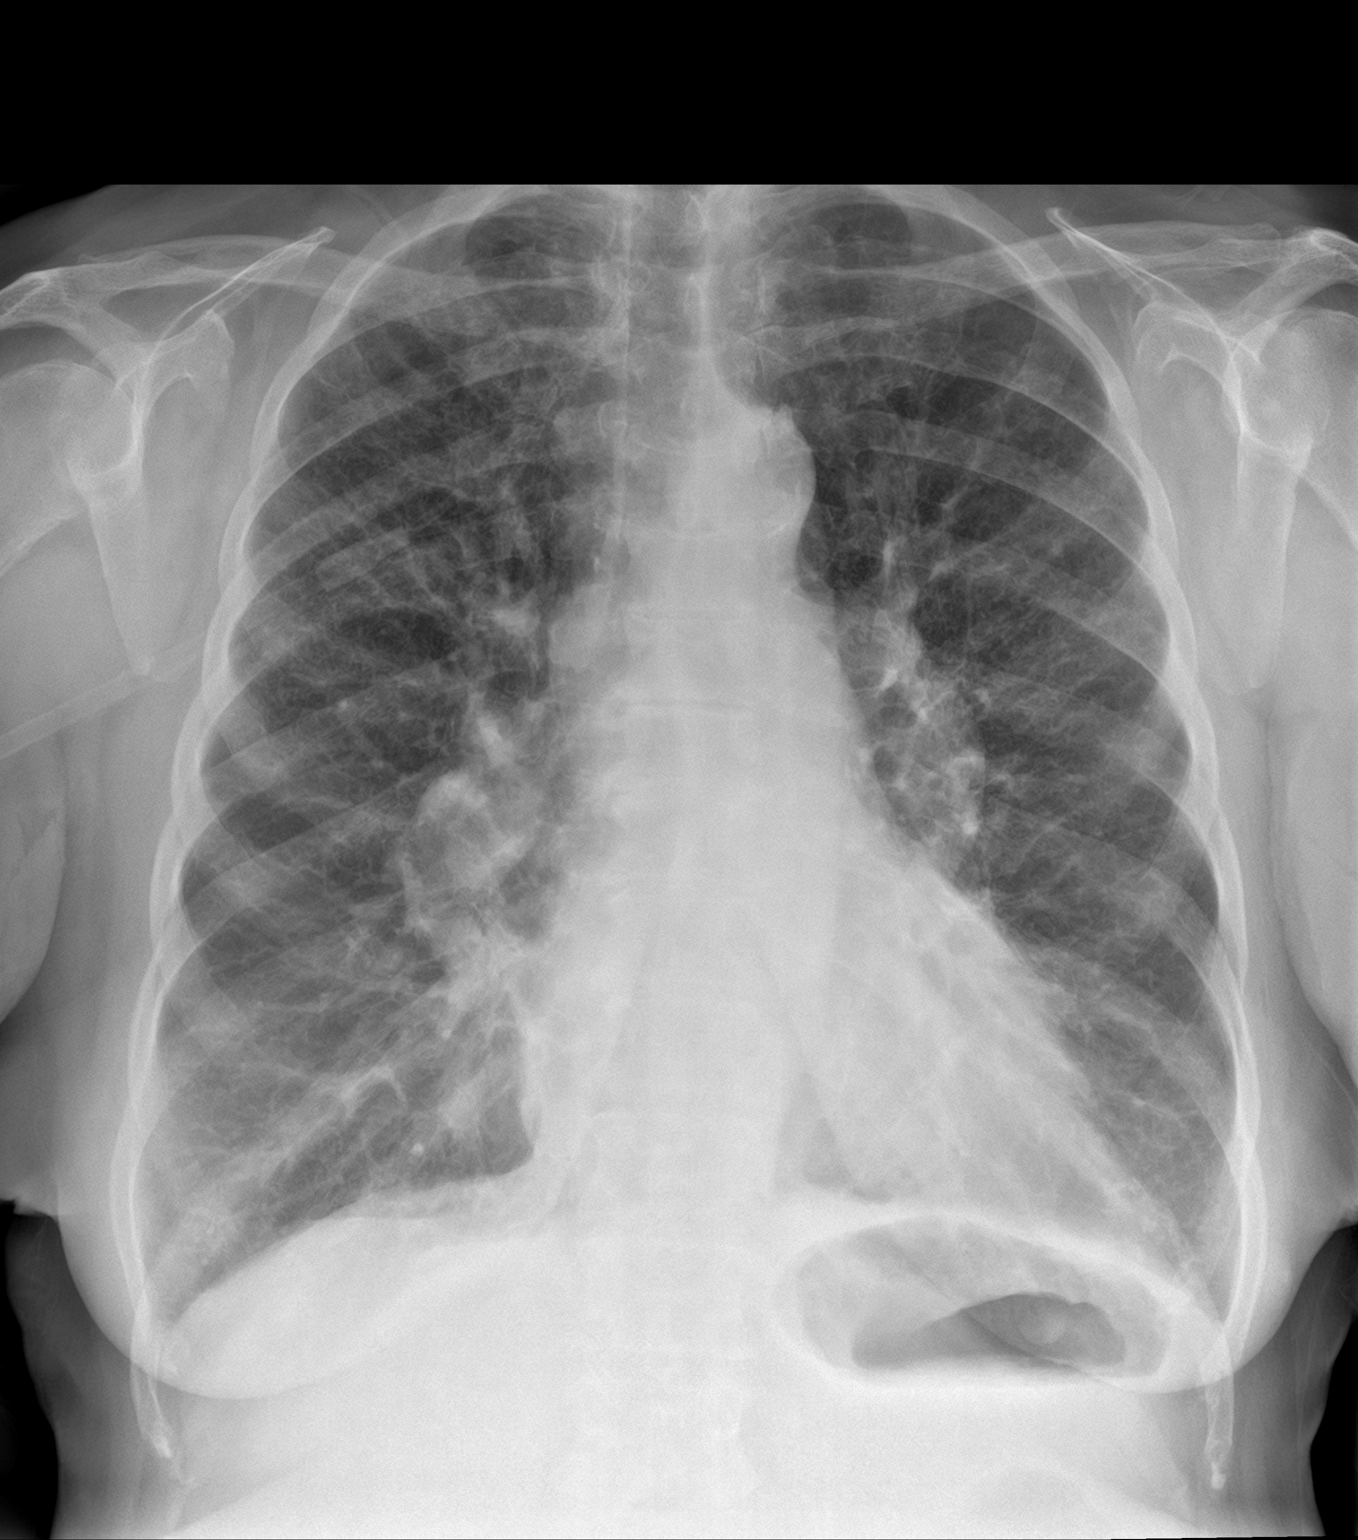

[chest lat]
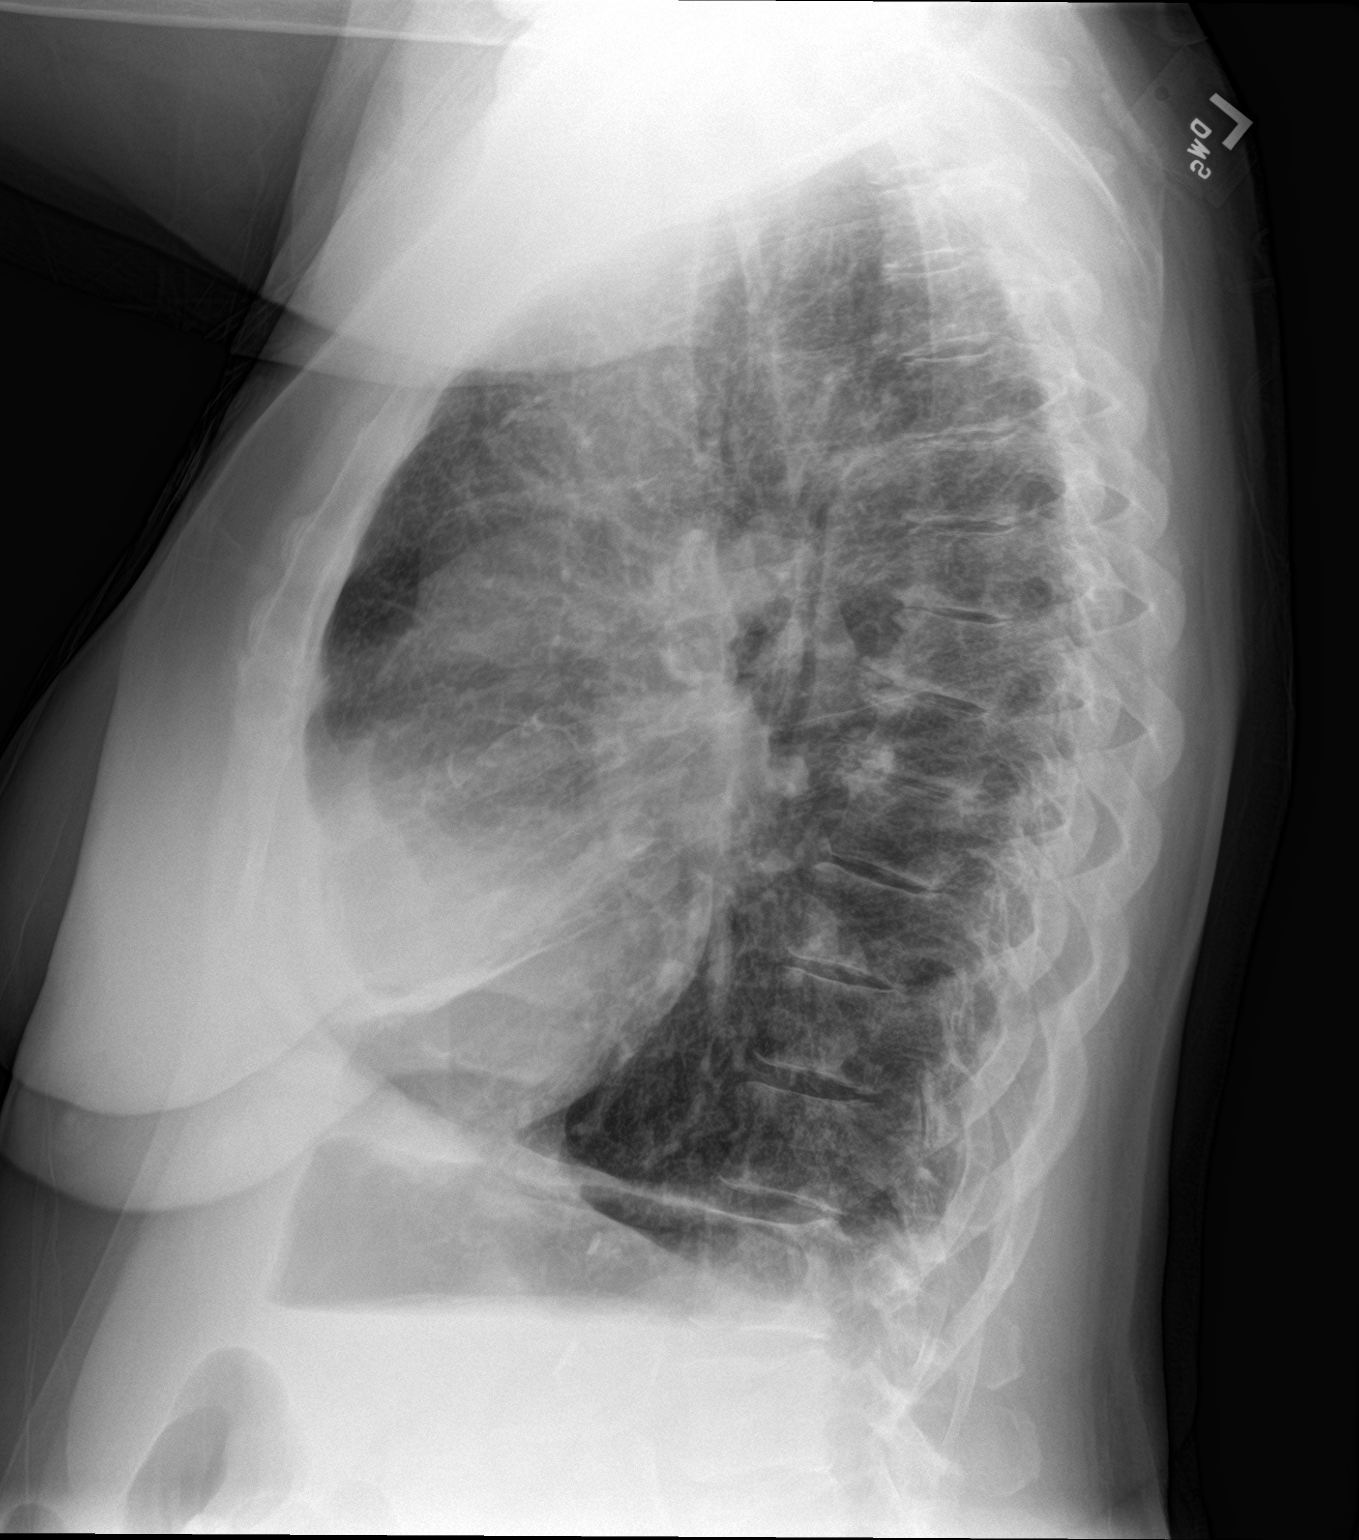

[2 of 2 positions shown; findings below may reference images not displayed]

FINDINGS: The heart and mediastinal contours are unchanged. Aortic
calcification. Prominent pulmonary arteries.

Hyperinflation of the lungs. No focal consolidation. Coarsened
interstitial markings with no overt pulmonary edema. No pleural
effusion. No pneumothorax.

No acute osseous abnormality.
IMPRESSION: 1. Chronic bronchitic lung changes with acute superimposed
infection/inflammation not excluded.
2. Aortic Atherosclerosis (J1DMZ-BQ1.1) and Emphysema (J1DMZ-0QL.J).
3. Persistent prominent pulmonary artery suggestive of pulmonary
hypertension.

## 2022-02-19 DIAGNOSIS — J9610 Chronic respiratory failure, unspecified whether with hypoxia or hypercapnia: Secondary | ICD-10-CM | POA: Diagnosis not present

## 2022-03-08 DIAGNOSIS — J449 Chronic obstructive pulmonary disease, unspecified: Secondary | ICD-10-CM | POA: Diagnosis not present

## 2022-03-22 DIAGNOSIS — J9610 Chronic respiratory failure, unspecified whether with hypoxia or hypercapnia: Secondary | ICD-10-CM | POA: Diagnosis not present

## 2022-03-25 ENCOUNTER — Other Ambulatory Visit: Payer: Self-pay | Admitting: Family

## 2022-03-25 MED ORDER — TORSEMIDE 20 MG PO TABS: ORAL_TABLET | ORAL | 5 refills | Status: DC

## 2022-03-25 NOTE — Progress Notes (Signed)
Sent new torsemide RX to pharmacy

## 2022-03-28 DIAGNOSIS — H25011 Cortical age-related cataract, right eye: Secondary | ICD-10-CM | POA: Diagnosis not present

## 2022-03-28 DIAGNOSIS — H2511 Age-related nuclear cataract, right eye: Secondary | ICD-10-CM | POA: Diagnosis not present

## 2022-04-01 ENCOUNTER — Encounter: Payer: Self-pay | Admitting: Intensive Care

## 2022-04-01 ENCOUNTER — Other Ambulatory Visit: Payer: Self-pay

## 2022-04-01 ENCOUNTER — Inpatient Hospital Stay
Admission: EM | Admit: 2022-04-01 | Discharge: 2022-04-09 | DRG: 291 | Disposition: A | Discharge status: Home / Self Care | Payer: Medicare HMO | Attending: Osteopathic Medicine | Admitting: Osteopathic Medicine

## 2022-04-01 ENCOUNTER — Emergency Department: Payer: Medicare HMO

## 2022-04-01 ENCOUNTER — Inpatient Hospital Stay: Payer: Medicare HMO

## 2022-04-01 DIAGNOSIS — R4701 Aphasia: Secondary | ICD-10-CM | POA: Diagnosis not present

## 2022-04-01 DIAGNOSIS — I10 Essential (primary) hypertension: Secondary | ICD-10-CM

## 2022-04-01 DIAGNOSIS — Z823 Family history of stroke: Secondary | ICD-10-CM

## 2022-04-01 DIAGNOSIS — Z803 Family history of malignant neoplasm of breast: Secondary | ICD-10-CM

## 2022-04-01 DIAGNOSIS — Z8669 Personal history of other diseases of the nervous system and sense organs: Secondary | ICD-10-CM | POA: Diagnosis not present

## 2022-04-01 DIAGNOSIS — I272 Pulmonary hypertension, unspecified: Secondary | ICD-10-CM | POA: Diagnosis not present

## 2022-04-01 DIAGNOSIS — F418 Other specified anxiety disorders: Secondary | ICD-10-CM | POA: Diagnosis not present

## 2022-04-01 DIAGNOSIS — F17201 Nicotine dependence, unspecified, in remission: Secondary | ICD-10-CM | POA: Diagnosis present

## 2022-04-01 DIAGNOSIS — M5030 Other cervical disc degeneration, unspecified cervical region: Secondary | ICD-10-CM | POA: Diagnosis not present

## 2022-04-01 DIAGNOSIS — G8929 Other chronic pain: Secondary | ICD-10-CM | POA: Diagnosis present

## 2022-04-01 DIAGNOSIS — Z885 Allergy status to narcotic agent status: Secondary | ICD-10-CM

## 2022-04-01 DIAGNOSIS — Z20822 Contact with and (suspected) exposure to covid-19: Secondary | ICD-10-CM | POA: Diagnosis present

## 2022-04-01 DIAGNOSIS — E669 Obesity, unspecified: Secondary | ICD-10-CM | POA: Diagnosis not present

## 2022-04-01 DIAGNOSIS — R519 Headache, unspecified: Secondary | ICD-10-CM | POA: Diagnosis not present

## 2022-04-01 DIAGNOSIS — I110 Hypertensive heart disease with heart failure: Principal | ICD-10-CM | POA: Diagnosis present

## 2022-04-01 DIAGNOSIS — Z886 Allergy status to analgesic agent status: Secondary | ICD-10-CM

## 2022-04-01 DIAGNOSIS — G4730 Sleep apnea, unspecified: Secondary | ICD-10-CM | POA: Diagnosis present

## 2022-04-01 DIAGNOSIS — E039 Hypothyroidism, unspecified: Secondary | ICD-10-CM | POA: Diagnosis not present

## 2022-04-01 DIAGNOSIS — G894 Chronic pain syndrome: Secondary | ICD-10-CM | POA: Diagnosis not present

## 2022-04-01 DIAGNOSIS — Z8249 Family history of ischemic heart disease and other diseases of the circulatory system: Secondary | ICD-10-CM

## 2022-04-01 DIAGNOSIS — M5481 Occipital neuralgia: Secondary | ICD-10-CM | POA: Diagnosis not present

## 2022-04-01 DIAGNOSIS — F419 Anxiety disorder, unspecified: Secondary | ICD-10-CM | POA: Diagnosis present

## 2022-04-01 DIAGNOSIS — Z8616 Personal history of COVID-19: Secondary | ICD-10-CM | POA: Diagnosis not present

## 2022-04-01 DIAGNOSIS — F17200 Nicotine dependence, unspecified, uncomplicated: Secondary | ICD-10-CM | POA: Diagnosis present

## 2022-04-01 DIAGNOSIS — K219 Gastro-esophageal reflux disease without esophagitis: Secondary | ICD-10-CM | POA: Diagnosis present

## 2022-04-01 DIAGNOSIS — R0902 Hypoxemia: Secondary | ICD-10-CM | POA: Diagnosis not present

## 2022-04-01 DIAGNOSIS — F17210 Nicotine dependence, cigarettes, uncomplicated: Secondary | ICD-10-CM | POA: Diagnosis present

## 2022-04-01 DIAGNOSIS — I6782 Cerebral ischemia: Secondary | ICD-10-CM | POA: Diagnosis not present

## 2022-04-01 DIAGNOSIS — J441 Chronic obstructive pulmonary disease with (acute) exacerbation: Secondary | ICD-10-CM

## 2022-04-01 DIAGNOSIS — J9622 Acute and chronic respiratory failure with hypercapnia: Secondary | ICD-10-CM | POA: Diagnosis not present

## 2022-04-01 DIAGNOSIS — Z9989 Dependence on other enabling machines and devices: Secondary | ICD-10-CM | POA: Diagnosis not present

## 2022-04-01 DIAGNOSIS — M47812 Spondylosis without myelopathy or radiculopathy, cervical region: Secondary | ICD-10-CM | POA: Diagnosis not present

## 2022-04-01 DIAGNOSIS — M5137 Other intervertebral disc degeneration, lumbosacral region: Secondary | ICD-10-CM | POA: Diagnosis present

## 2022-04-01 DIAGNOSIS — G4733 Obstructive sleep apnea (adult) (pediatric): Secondary | ICD-10-CM | POA: Diagnosis not present

## 2022-04-01 DIAGNOSIS — J9621 Acute and chronic respiratory failure with hypoxia: Secondary | ICD-10-CM | POA: Diagnosis not present

## 2022-04-01 DIAGNOSIS — Z9981 Dependence on supplemental oxygen: Secondary | ICD-10-CM

## 2022-04-01 DIAGNOSIS — Z6837 Body mass index (BMI) 37.0-37.9, adult: Secondary | ICD-10-CM | POA: Diagnosis not present

## 2022-04-01 DIAGNOSIS — M4802 Spinal stenosis, cervical region: Secondary | ICD-10-CM | POA: Diagnosis not present

## 2022-04-01 DIAGNOSIS — M5001 Cervical disc disorder with myelopathy,  high cervical region: Secondary | ICD-10-CM | POA: Diagnosis not present

## 2022-04-01 DIAGNOSIS — F32A Depression, unspecified: Secondary | ICD-10-CM | POA: Diagnosis not present

## 2022-04-01 DIAGNOSIS — I5033 Acute on chronic diastolic (congestive) heart failure: Secondary | ICD-10-CM

## 2022-04-01 DIAGNOSIS — I5031 Acute diastolic (congestive) heart failure: Secondary | ICD-10-CM | POA: Diagnosis not present

## 2022-04-01 DIAGNOSIS — Z79899 Other long term (current) drug therapy: Secondary | ICD-10-CM

## 2022-04-01 DIAGNOSIS — I2699 Other pulmonary embolism without acute cor pulmonale: Secondary | ICD-10-CM | POA: Diagnosis not present

## 2022-04-01 DIAGNOSIS — R69 Illness, unspecified: Secondary | ICD-10-CM | POA: Diagnosis not present

## 2022-04-01 DIAGNOSIS — I509 Heart failure, unspecified: Secondary | ICD-10-CM

## 2022-04-01 DIAGNOSIS — I6523 Occlusion and stenosis of bilateral carotid arteries: Secondary | ICD-10-CM | POA: Diagnosis not present

## 2022-04-01 DIAGNOSIS — Z720 Tobacco use: Secondary | ICD-10-CM

## 2022-04-01 DIAGNOSIS — J439 Emphysema, unspecified: Secondary | ICD-10-CM | POA: Diagnosis not present

## 2022-04-01 DIAGNOSIS — R0602 Shortness of breath: Secondary | ICD-10-CM | POA: Diagnosis not present

## 2022-04-01 DIAGNOSIS — Z801 Family history of malignant neoplasm of trachea, bronchus and lung: Secondary | ICD-10-CM

## 2022-04-01 DIAGNOSIS — M50022 Cervical disc disorder at C5-C6 level with myelopathy: Secondary | ICD-10-CM | POA: Diagnosis not present

## 2022-04-01 DIAGNOSIS — R29818 Other symptoms and signs involving the nervous system: Secondary | ICD-10-CM | POA: Diagnosis not present

## 2022-04-01 DIAGNOSIS — Z811 Family history of alcohol abuse and dependence: Secondary | ICD-10-CM

## 2022-04-01 DIAGNOSIS — G319 Degenerative disease of nervous system, unspecified: Secondary | ICD-10-CM | POA: Diagnosis not present

## 2022-04-01 DIAGNOSIS — Z888 Allergy status to other drugs, medicaments and biological substances status: Secondary | ICD-10-CM

## 2022-04-01 DIAGNOSIS — Q048 Other specified congenital malformations of brain: Secondary | ICD-10-CM | POA: Diagnosis not present

## 2022-04-01 DIAGNOSIS — Z7982 Long term (current) use of aspirin: Secondary | ICD-10-CM

## 2022-04-01 DIAGNOSIS — Z8701 Personal history of pneumonia (recurrent): Secondary | ICD-10-CM

## 2022-04-01 LAB — COMPREHENSIVE METABOLIC PANEL
ALT: 21 U/L (ref 0–44)
AST: 20 U/L (ref 15–41)
Albumin: 3.7 g/dL (ref 3.5–5.0)
Alkaline Phosphatase: 74 U/L (ref 38–126)
Anion gap: 6 (ref 5–15)
BUN: 16 mg/dL (ref 8–23)
CO2: 30 mmol/L (ref 22–32)
Calcium: 9.3 mg/dL (ref 8.9–10.3)
Chloride: 103 mmol/L (ref 98–111)
Creatinine, Ser: 0.54 mg/dL (ref 0.44–1.00)
GFR, Estimated: >60 mL/min (ref >60)
Glucose, Bld: 106 mg/dL — ABNORMAL HIGH (ref 70–99)
Potassium: 3.9 mmol/L (ref 3.5–5.1)
Sodium: 139 mmol/L (ref 135–145)
Total Bilirubin: 0.4 mg/dL (ref 0.3–1.2)
Total Protein: 7.2 g/dL (ref 6.5–8.1)

## 2022-04-01 LAB — URINALYSIS, COMPLETE (UACMP) WITH MICROSCOPIC
Bilirubin Urine: NEGATIVE
Glucose, UA: NEGATIVE mg/dL
Ketones, ur: NEGATIVE mg/dL
Leukocytes,Ua: NEGATIVE
Nitrite: NEGATIVE
Protein, ur: NEGATIVE mg/dL
Specific Gravity, Urine: 1.006 (ref 1.005–1.030)
pH: 6 (ref 5.0–8.0)

## 2022-04-01 LAB — RESPIRATORY PANEL BY PCR

## 2022-04-01 LAB — RESP PANEL BY RT-PCR (FLU A&B, COVID) ARPGX2
Influenza A by PCR: NEGATIVE
Influenza B by PCR: NEGATIVE
SARS Coronavirus 2 by RT PCR: NEGATIVE

## 2022-04-01 LAB — CBC WITH DIFFERENTIAL/PLATELET
Abs Immature Granulocytes: 0.03 10*3/uL (ref 0.00–0.07)
Basophils Absolute: 0 10*3/uL (ref 0.0–0.1)
Basophils Relative: 1 %
Eosinophils Absolute: 0.1 10*3/uL (ref 0.0–0.5)
Eosinophils Relative: 2 %
HCT: 46.1 % — ABNORMAL HIGH (ref 36.0–46.0)
Hemoglobin: 14.2 g/dL (ref 12.0–15.0)
Immature Granulocytes: 0 %
Lymphocytes Relative: 13 %
Lymphs Abs: 1 10*3/uL (ref 0.7–4.0)
MCH: 30.7 pg (ref 26.0–34.0)
MCHC: 30.8 g/dL (ref 30.0–36.0)
MCV: 99.6 fL (ref 80.0–100.0)
Monocytes Absolute: 0.4 10*3/uL (ref 0.1–1.0)
Monocytes Relative: 5 %
Neutro Abs: 6.1 10*3/uL (ref 1.7–7.7)
Neutrophils Relative %: 79 %
Platelets: 162 10*3/uL (ref 150–400)
RBC: 4.63 MIL/uL (ref 3.87–5.11)
RDW: 16 % — ABNORMAL HIGH (ref 11.5–15.5)
WBC: 7.7 10*3/uL (ref 4.0–10.5)
nRBC: 0 % (ref 0.0–0.2)

## 2022-04-01 LAB — BRAIN NATRIURETIC PEPTIDE: B Natriuretic Peptide: 26.9 pg/mL (ref 0.0–100.0)

## 2022-04-01 LAB — APTT: aPTT: 36 seconds (ref 24–36)

## 2022-04-01 LAB — PROCALCITONIN: Procalcitonin: <0.1 ng/mL

## 2022-04-01 LAB — PROTIME-INR
INR: 0.9 (ref 0.8–1.2)
Prothrombin Time: 12.4 seconds (ref 11.4–15.2)

## 2022-04-01 LAB — TROPONIN I (HIGH SENSITIVITY): Troponin I (High Sensitivity): 8 ng/L (ref <18)

## 2022-04-01 LAB — LACTIC ACID, PLASMA: Lactic Acid, Venous: 0.9 mmol/L (ref 0.5–1.9)

## 2022-04-01 MED ORDER — LEVOTHYROXINE SODIUM 25 MCG PO TABS
25.0000 ug | ORAL_TABLET | Freq: Every day | ORAL | Status: DC
  Administered 2022-04-02 – 2022-04-09 (×8): 25 ug via ORAL
  Filled 2022-04-01 (×8): qty 1

## 2022-04-01 MED ORDER — IPRATROPIUM-ALBUTEROL 0.5-2.5 (3) MG/3ML IN SOLN
3.0000 mL | RESPIRATORY_TRACT | Status: DC
  Administered 2022-04-01 – 2022-04-02 (×6): 3 mL via RESPIRATORY_TRACT
  Filled 2022-04-01 (×6): qty 3

## 2022-04-01 MED ORDER — HYDRALAZINE HCL 20 MG/ML IJ SOLN
5.0000 mg | INTRAMUSCULAR | Status: DC | PRN
Start: 2022-04-01 — End: 2022-04-09

## 2022-04-01 MED ORDER — DIFLUPREDNATE 0.05 % OP EMUL: 1.0000 [drp] | Freq: Four times a day (QID) | OPHTHALMIC | Status: DC

## 2022-04-01 MED ORDER — GABAPENTIN 100 MG PO CAPS
100.0000 mg | ORAL_CAPSULE | Freq: Two times a day (BID) | ORAL | Status: DC
  Filled 2022-04-01: qty 1

## 2022-04-01 MED ORDER — KETOROLAC TROMETHAMINE 15 MG/ML IJ SOLN
15.0000 mg | Freq: Once | INTRAMUSCULAR | Status: AC
Start: 2022-04-01 — End: 2022-04-01
  Administered 2022-04-01: 15 mg via INTRAVENOUS
  Filled 2022-04-01: qty 1

## 2022-04-01 MED ORDER — IPRATROPIUM-ALBUTEROL 0.5-2.5 (3) MG/3ML IN SOLN
3.0000 mL | Freq: Once | RESPIRATORY_TRACT | Status: AC
  Administered 2022-04-01: 3 mL via RESPIRATORY_TRACT
  Filled 2022-04-01: qty 3

## 2022-04-01 MED ORDER — ASPIRIN 81 MG PO TBEC
81.0000 mg | DELAYED_RELEASE_TABLET | Freq: Every day | ORAL | Status: DC
  Administered 2022-04-01 – 2022-04-09 (×9): 81 mg via ORAL
  Filled 2022-04-01 (×9): qty 1

## 2022-04-01 MED ORDER — AZITHROMYCIN 500 MG PO TABS: 500.0000 mg | ORAL_TABLET | Freq: Every day | ORAL | Status: DC

## 2022-04-01 MED ORDER — VITAMIN D 25 MCG (1000 UNIT) PO TABS
2000.0000 [IU] | ORAL_TABLET | Freq: Every day | ORAL | Status: DC
  Administered 2022-04-02 – 2022-04-09 (×8): 2000 [IU] via ORAL
  Filled 2022-04-01 (×8): qty 2

## 2022-04-01 MED ORDER — SODIUM CHLORIDE 0.9 % IV SOLN
2.0000 g | INTRAVENOUS | Status: DC
  Administered 2022-04-01: 2 g via INTRAVENOUS
  Filled 2022-04-01: qty 20

## 2022-04-01 MED ORDER — AMBRISENTAN 5 MG PO TABS
5.0000 mg | ORAL_TABLET | Freq: Every day | ORAL | Status: DC
  Administered 2022-04-02 – 2022-04-09 (×8): 5 mg via ORAL
  Filled 2022-04-01 (×8): qty 1

## 2022-04-01 MED ORDER — FUROSEMIDE 10 MG/ML IJ SOLN
80.0000 mg | Freq: Two times a day (BID) | INTRAMUSCULAR | Status: DC
  Administered 2022-04-01 – 2022-04-02 (×2): 80 mg via INTRAVENOUS
  Filled 2022-04-01 (×2): qty 8

## 2022-04-01 MED ORDER — DM-GUAIFENESIN ER 30-600 MG PO TB12
1.0000 | ORAL_TABLET | Freq: Two times a day (BID) | ORAL | Status: DC | PRN
  Administered 2022-04-05: 1 via ORAL
  Filled 2022-04-01: qty 1

## 2022-04-01 MED ORDER — NICOTINE 21 MG/24HR TD PT24
21.0000 mg | MEDICATED_PATCH | Freq: Every day | TRANSDERMAL | Status: DC
  Administered 2022-04-01 – 2022-04-09 (×9): 21 mg via TRANSDERMAL
  Filled 2022-04-01 (×9): qty 1

## 2022-04-01 MED ORDER — PREGABALIN 25 MG PO CAPS
25.0000 mg | ORAL_CAPSULE | Freq: Every day | ORAL | Status: DC
  Administered 2022-04-01 – 2022-04-02 (×2): 25 mg via ORAL
  Filled 2022-04-01 (×2): qty 1

## 2022-04-01 MED ORDER — ENOXAPARIN SODIUM 40 MG/0.4ML IJ SOSY
0.5000 mg/kg | PREFILLED_SYRINGE | INTRAMUSCULAR | Status: DC
  Administered 2022-04-01 – 2022-04-08 (×8): 40 mg via SUBCUTANEOUS
  Filled 2022-04-01 (×8): qty 0.4

## 2022-04-01 MED ORDER — SODIUM CHLORIDE 0.9 % IV SOLN
500.0000 mg | INTRAVENOUS | Status: DC
  Administered 2022-04-01: 500 mg via INTRAVENOUS
  Filled 2022-04-01: qty 5

## 2022-04-01 MED ORDER — ALBUTEROL SULFATE (2.5 MG/3ML) 0.083% IN NEBU
5.0000 mg | INHALATION_SOLUTION | Freq: Once | RESPIRATORY_TRACT | Status: AC
  Administered 2022-04-01: 5 mg via RESPIRATORY_TRACT
  Filled 2022-04-01: qty 6

## 2022-04-01 MED ORDER — AZITHROMYCIN 250 MG PO TABS
250.0000 mg | ORAL_TABLET | Freq: Every day | ORAL | Status: AC
  Administered 2022-04-02 – 2022-04-05 (×4): 250 mg via ORAL
  Filled 2022-04-01 (×4): qty 1

## 2022-04-01 MED ORDER — SERTRALINE HCL 50 MG PO TABS
200.0000 mg | ORAL_TABLET | Freq: Every morning | ORAL | Status: DC
  Administered 2022-04-02 – 2022-04-09 (×8): 200 mg via ORAL
  Filled 2022-04-01 (×8): qty 4

## 2022-04-01 MED ORDER — HYDROCODONE-ACETAMINOPHEN 5-325 MG PO TABS
1.0000 | ORAL_TABLET | Freq: Three times a day (TID) | ORAL | Status: DC | PRN
  Administered 2022-04-01 – 2022-04-08 (×15): 1 via ORAL
  Filled 2022-04-01 (×16): qty 1

## 2022-04-01 MED ORDER — KETOROLAC TROMETHAMINE 0.5 % OP SOLN
1.0000 [drp] | Freq: Every day | OPHTHALMIC | Status: DC
  Filled 2022-04-01: qty 3

## 2022-04-01 MED ORDER — ACETAMINOPHEN 325 MG PO TABS
650.0000 mg | ORAL_TABLET | Freq: Four times a day (QID) | ORAL | Status: DC | PRN
  Administered 2022-04-01 – 2022-04-04 (×5): 650 mg via ORAL
  Filled 2022-04-01 (×5): qty 2

## 2022-04-01 MED ORDER — AMBRISENTAN 5 MG PO TABS
5.0000 mg | ORAL_TABLET | Freq: Every day | ORAL | Status: DC
Start: 2022-04-02 — End: 2022-04-01

## 2022-04-01 MED ORDER — ALBUTEROL SULFATE (2.5 MG/3ML) 0.083% IN NEBU
2.5000 mg | INHALATION_SOLUTION | RESPIRATORY_TRACT | Status: DC | PRN
  Administered 2022-04-02: 2.5 mg via RESPIRATORY_TRACT
  Filled 2022-04-01: qty 3

## 2022-04-01 MED ORDER — GATIFLOXACIN 0.5 % OP SOLN
1.0000 [drp] | Freq: Three times a day (TID) | OPHTHALMIC | Status: DC
  Filled 2022-04-01: qty 2.5

## 2022-04-01 MED ORDER — METHYLPREDNISOLONE SODIUM SUCC 125 MG IJ SOLR
125.0000 mg | INTRAMUSCULAR | Status: AC
  Administered 2022-04-01: 125 mg via INTRAVENOUS
  Filled 2022-04-01: qty 2

## 2022-04-01 MED ORDER — ONDANSETRON HCL 4 MG/2ML IJ SOLN: 4.0000 mg | Freq: Three times a day (TID) | INTRAMUSCULAR | Status: DC | PRN

## 2022-04-01 MED ORDER — FUROSEMIDE 10 MG/ML IJ SOLN
120.0000 mg | Freq: Once | INTRAVENOUS | Status: AC
  Administered 2022-04-01: 120 mg via INTRAVENOUS
  Filled 2022-04-01: qty 12

## 2022-04-01 MED ORDER — METHYLPREDNISOLONE SODIUM SUCC 40 MG IJ SOLR
40.0000 mg | Freq: Two times a day (BID) | INTRAMUSCULAR | Status: DC
  Administered 2022-04-01 – 2022-04-02 (×2): 40 mg via INTRAVENOUS
  Filled 2022-04-01 (×2): qty 1

## 2022-04-01 MED ORDER — AZITHROMYCIN 250 MG PO TABS: 250.0000 mg | ORAL_TABLET | Freq: Every day | ORAL | Status: DC

## 2022-04-01 NOTE — Assessment & Plan Note (Signed)
Echo this admission with EF 55 to 63%, grade 1 diastolic dysfunction. Echo on 08/08/2021 showed EF 60-65%.  Presented with shortness of breath, pulmonary edema on chest x-ray, JVD, bibasilar crackles and lower extremity edema. Net IO Since Admission: -12,410 mL [04/06/22 1439]  6/18: 2900 cc output.  Appears euvolemic.  Transitioned back to torsemide  6/20: still needs 4-5 L O2 above baseline, wt is up.  BNP up and CXR with edema   Resume Lasix 40 mg twice daily  Hold home torsemide this evening (takes 80 mg in the morning, 40 in the evening)  Strict I/O's and daily weights  Low sodium diet  Monitor renal function and electrolytes  Follows with Dr. Clayborn Bigness, close follow-up recommended

## 2022-04-01 NOTE — Assessment & Plan Note (Signed)
   Nicotine patch

## 2022-04-01 NOTE — Assessment & Plan Note (Signed)
   Treated with IV Lasix  Home torsemide on hold  IV hydralazine as needed

## 2022-04-01 NOTE — Assessment & Plan Note (Signed)
Presented with tachycardia and tachypnea, expiratory wheezes and poor air movement on exam.  Unlikely infectious etiology given no fever or leukocytosis, lactic acid normal, no focal infiltrates on CXR.  Sepsis was ruled out.  Management as outlined for acute on chronic respiratory failure

## 2022-04-01 NOTE — Assessment & Plan Note (Addendum)
Acute on chronic respiratory failure with hypoxia due to combination of CHF and COPD exacerbation.   At baseline, patient uses 2.5-3 L/min supplemental O2 due to pulmonary hypertension and COPD.    6/17: Diffuse expiratory high-pitched wheezes, poor air movement on 4 L/min O2  6/19: Still on 4-5 L/min, wheezing improved, Pulmonology Dr. Meredeth Ide, consulted  Continued on IV Solu-Medrol 60 mg twice daily, scheduled DuoNebs, as needed albuterol nebs, P.o. Zithromax, home Trelegy, [place], [place]. BiPAP overnight / PRN, Supplemental O2 to maintain sats 88 to 93%  Per pulmonary note 06/22: ok to d/c home on 4L O2 and and transition to po prednisone 7 day taper, can d/c azithromycin, see outpatient in a week

## 2022-04-01 NOTE — Assessment & Plan Note (Signed)
   Continue home Zoloft

## 2022-04-01 NOTE — ED Notes (Signed)
Oxygen increased to 10lpm via bipap due to sats in 80s. Pt with rebound pox to 94%. Pt denies increased shob. Pt appears in no acute distress.

## 2022-04-01 NOTE — ED Notes (Signed)
Pt moved into hospital bed for comfort

## 2022-04-01 NOTE — Assessment & Plan Note (Signed)
   BiPAP overnight/as needed

## 2022-04-01 NOTE — Sepsis Progress Note (Signed)
Elink monitoring code sepsis

## 2022-04-01 NOTE — ED Notes (Signed)
RT in to place pt on nightly bipap

## 2022-04-01 NOTE — Assessment & Plan Note (Signed)
Continue Letairis.  Follows with Dr. Raul Del, consulted.

## 2022-04-01 NOTE — Assessment & Plan Note (Signed)
   BiPAP at night

## 2022-04-01 NOTE — ED Provider Notes (Signed)
[facility] Provider Note    Event Date/Time   First MD Initiated Contact with Patient 04/01/22 [phone]     (approximate)   History   Chief Complaint: Shortness of Breath and Headache   HPI  Theresa [name] is a 66 y.o. female with a history of COPD on 3 L nasal cannula oxygen and nighttime BiPAP use, CHF on torsemide, GERD, hypertension, chronic back pain who reports worsening posterior headache over the last 4 days.  Gradual onset, worsening, waxing waning, no aggravating or alleviating factors.  No focal paresthesia or weakness.  No vision changes.  Associated with shortness of breath that is also been constant and worsening during this time, worse with laying down, worse with ambulation.  Denies chest pain.  4 days ago prior to onset of the symptoms COVID she had right cataract removal.  She reports no eye pain and gradual improvement in her vision on that side.     Physical Exam   Triage Vital Signs: ED Triage Vitals  Enc Vitals Group     BP 04/01/22 0800 107/72     Pulse Rate 04/01/22 0800 94     Resp 04/01/22 0800 (!) 23     Temp 04/01/22 0800 99 F (37.2 C)     Temp Source 04/01/22 0800 Oral     SpO2 04/01/22 0800 (!) 80 %     Weight 04/01/22 0801 180 lb (81.6 kg)     Height 04/01/22 0801 _0  (1.575 m)     Head Circumference --      Peak Flow --      Pain Score 04/01/22 0801 9     Pain Loc --      Pain Edu? --      Excl. in [place]? --     Most recent vital signs: Vitals:   04/01/22 0803 04/01/22 0809  BP:    Pulse:  93  Resp:  20  Temp:    SpO2: 94% 95%    General: Awake, mild distress.  CV:  Good peripheral perfusion.  Regular rate and rhythm Resp:  Normal effort.  Diffuse expiratory wheezing and prolonged expiratory phase.  Bilateral basilar inspiratory crackles.  Symmetric air movement. Abd:  No distention.  Soft and nontender Other:  2+ pitting edema bilateral lower extremities   ED Results / Procedures / Treatments    Labs (all labs ordered are listed, but only abnormal results are displayed) Labs Reviewed  CBC WITH DIFFERENTIAL/PLATELET - Abnormal; Notable for the following components:      Result Value   HCT 46.1 (*)    RDW 16.0 (*)    All other components within normal limits  RESP PANEL BY RT-PCR (FLU A&B, COVID) ARPGX2  CULTURE, BLOOD (ROUTINE X 2)  CULTURE, BLOOD (ROUTINE X 2)  URINE CULTURE  PROTIME-INR  APTT  LACTIC ACID, PLASMA  LACTIC ACID, PLASMA  COMPREHENSIVE METABOLIC PANEL  URINALYSIS, COMPLETE (UACMP) WITH MICROSCOPIC  TROPONIN I (HIGH SENSITIVITY)     EKG Interpreted by me Sinus rhythm rate of 95.  Normal axis, normal intervals.  Poor R wave progression.  Normal ST segments and T waves.  No acute ischemic changes.   RADIOLOGY Chest x-ray interpreted by me, shows mild bibasilar edema.  Radiology report reviewed   PROCEDURES:  .Critical Care  Performed by: Carrie Mew, MD Authorized by: Carrie Mew, MD   Critical care provider statement:    Critical care time (minutes):  35   Critical care time was  exclusive of:  Separately billable procedures and treating other patients   Critical care was necessary to treat or prevent imminent or life-threatening deterioration of the following conditions:  Sepsis and respiratory failure   Critical care was time spent personally by me on the following activities:  Development of treatment plan with patient or surrogate, discussions with consultants, evaluation of patient's response to treatment, examination of patient, obtaining history from patient or surrogate, ordering and performing treatments and interventions, ordering and review of laboratory studies, ordering and review of radiographic studies, pulse oximetry, re-evaluation of patient's condition and review of old charts   Care discussed with: admitting provider   Comments:        .1-3 Lead EKG Interpretation  Performed by: Carrie Mew, MD Authorized  by: Carrie Mew, MD     Interpretation: normal     ECG rate:  90   ECG rate assessment: normal     Rhythm: sinus rhythm     Ectopy: none     Conduction: normal      MEDICATIONS ORDERED IN ED: Medications  cefTRIAXone (ROCEPHIN) 2 g in sodium chloride 0.9 % 100 mL IVPB (2 g Intravenous New Bag/Given 04/01/22 0822)  azithromycin (ZITHROMAX) 500 mg in sodium chloride 0.9 % 250 mL IVPB (500 mg Intravenous New Bag/Given 04/01/22 0822)  furosemide (LASIX) 120 mg in dextrose 5 % 50 mL IVPB (has no administration in time range)  ketorolac (TORADOL) 15 MG/ML injection 15 mg (15 mg Intravenous Given 04/01/22 0817)  ipratropium-albuterol (DUONEB) 0.5-2.5 (3) MG/3ML nebulizer solution 3 mL (3 mLs Nebulization Given 04/01/22 0819)  albuterol (PROVENTIL) (2.5 MG/3ML) 0.083% nebulizer solution 5 mg (5 mg Nebulization Given 04/01/22 0819)  methylPREDNISolone sodium succinate (SOLU-MEDROL) 125 mg/2 mL injection 125 mg (125 mg Intravenous Given 04/01/22 0818)     IMPRESSION / MDM / [place] / ED COURSE  I reviewed the triage vital signs and the nursing notes.                              Differential diagnosis includes, but is not limited to, pneumonia, pulmonary edema, pleural effusion, COPD exacerbation, ACS, viral illness  Patient's presentation is most consistent with severe exacerbation of chronic illness.  Patient presents with shortness of breath, orthopnea, dyspnea on exertion.  She is 80% oxygen saturation on her chronic 3 L nasal cannula, revealing acute on chronic respiratory failure with hypoxia.  Mental status is okay.  We will give IV Solu-Medrol, bronchodilators for respiratory support, begin IV diuresis with Lasix.  She is on high-dose torsemide at home at baseline.  She will need to be admitted for further management until symptoms and oxygenation are improving.  Doubt PE dissection pericardial effusion.       FINAL CLINICAL IMPRESSION(S) / ED DIAGNOSES   Final  diagnoses:  Acute on chronic respiratory failure with hypoxia (HCC)  COPD exacerbation (HCC)  Acute on chronic congestive heart failure, unspecified heart failure type ([place])     Rx / DC Orders   ED Discharge Orders     None        Note:  This document was prepared using Dragon voice recognition software and may include unintentional dictation errors.   Carrie Mew, MD 04/01/22 [phone]

## 2022-04-01 NOTE — ED Notes (Signed)
Waiting on toradol eye drops from the pharmacy

## 2022-04-01 NOTE — Assessment & Plan Note (Addendum)
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  No fevers.  CT head nonacute.  No temporal tenderness.  Has tenderness on palpation over occipital nerves bilaterally, R >L.  ESR minimally elevated 33.  CRP normal 0.9, less likely inflammatory etiology  Started on very low-dose Lyrica 25 mg on admission without any improvement (patient declined gabapentin due to profound side effects including frequent falls went on in the past, weaned herself off).  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

## 2022-04-01 NOTE — ED Notes (Signed)
Xray at bedside

## 2022-04-01 NOTE — Assessment & Plan Note (Signed)
   Continue home Norco as needed  Has acute pain related to severe headache

## 2022-04-01 NOTE — Assessment & Plan Note (Addendum)
Continue Synthroid °

## 2022-04-01 NOTE — H&P (Signed)
History and Physical    Theresa [name] UQJ:[id number] DOB: [birth year] DOA: 04/01/2022  Referring MD/NP/PA:   PCP: Birdie Sons, MD   Patient coming from:  The patient is coming from home.  At baseline, pt is independent for most of ADL.        Chief Complaint: SOB and headache.  HPI: Theresa [name] is a 66 y.o. female with medical history significant of dCHF, hypertension, COPD on 2.5 L oxygen, GERD, hypothyroidism, depression with anxiety, tobacco abuse, obesity with BMI 32.92, OSA with BiPAP dependency, chronic pain syndrome, pulmonary hypertension, who presents with shortness of breath and headache.  Patient states that he has shortness breath for more than 3 days, which has been progressively worsening.  Patient has cough with little mucus production, denies chest pain, fever or chills. No nausea vomiting, diarrhea or abdominal pain.  No symptoms of UTI.  Patient also reports headache, which is located in the occipital area, constant, sharp, severe, nonradiating, associated with scalp tenderness.  No head injury or fall.  Denies unilateral numbness or tinglings in extremities.  No facial droop or slurred speech.  Patient was found to have severe respiratory distress, hardly speaking in full sentence, using accessory muscle for breathing.  Oxygen saturation 80% on 3 L oxygen (patient is normally on 2.5 L oxygen), started 6 L oxygen with 96% saturation.     Data Reviewed and ED Course: pt was found to have WBC 7.7, lactic acid 0.9, INR 0.9, PTT 36, negative COVID PCR, GFR> 60, temperature 99, blood pressure 109/65, heart rate 96, RR 23, 17.  Chest x-ray showed COPD and pulmonary edema.  Patient is admitted to PCU as inpatient.   EKG: I have personally reviewed.  Sinus rhythm, QTc 464, LAD, low voltage, nonspecific T wave change   Review of Systems:   General: no fevers, chills, no body weight gain, has fatigue HEENT: no blurry vision, hearing changes or sore throat. Has  HA Respiratory: Has dyspnea, coughing, wheezing CV: no chest pain, no palpitations GI: no nausea, vomiting, abdominal pain, diarrhea, constipation GU: no dysuria, burning on urination, increased urinary frequency, hematuria  Ext: Has leg edema Neuro: no unilateral weakness, numbness, or tingling, no vision change or hearing loss Skin: no rash, no skin tear. MSK: No muscle spasm, no deformity, no limitation of range of movement in spin Heme: No easy bruising.  Travel history: No recent long distant travel.   Allergy:  Allergies  Allergen Reactions   Gabapentin Other (See Comments)    Higher doses of 800 mg, 600 mg, 300 mg causes excessive sedation and contributes to numerous mechanical falls. (Unsure if 100 mg dose can be tolerated)   Codeine Nausea And Vomiting   Meloxicam Other (See Comments)    Stomach pain    Past Medical History:  Diagnosis Date   Acute postoperative pain 10/03/2017   Anxiety    BiPAP (biphasic positive airway pressure) dependence    at hs   Carpal tunnel syndrome    CHF (congestive heart failure) ([place])    1/18   CHF (congestive heart failure) (HCC)    Chronic generalized abdominal pain    Chronic rhinitis    COPD (chronic obstructive pulmonary disease) (HCC)    DDD (degenerative disc disease), cervical    DDD (degenerative disc disease), lumbosacral    Depression    Dyspnea    Edema    Flu    1/18   Gastritis    GERD (gastroesophageal reflux disease)  Hematuria    Hernia of abdominal wall 07/17/2018   Hernia, abdominal    Hypertension    Kidney stones    Low back pain    Lumbar radiculitis    Malodorous urine    Muscle weakness    Obesity    Oxygen dependent    Pneumonia due to COVID-19 virus 02/03/2020   Renal cyst    Sensory urge incontinence    Thyroid activity decreased    9/19   Tobacco abuse    Wheezing     Past Surgical History:  Procedure Laterality Date   ABDOMINAL HYSTERECTOMY     CARPAL TUNNEL RELEASE Left 2012    EPIGASTRIC HERNIA REPAIR N/A 08/13/2018   HERNIA REPAIR EPIGASTRIC ADULT; polypropylene mesh reinforcement.  Surgeon: Robert Bellow, MD;  Location: [facility];  Service: General;  Laterality: N/A;   RIGHT/LEFT HEART CATH AND CORONARY ANGIOGRAPHY N/A 07/11/2019   Procedure: RIGHT/LEFT HEART CATH AND CORONARY ANGIOGRAPHY;  Surgeon: Yolonda Kida, MD;  Location: [facility];  Service: Cardiovascular;  Laterality: N/A;   TONSILLECTOMY     TUBAL LIGATION     UMBILICAL HERNIA REPAIR N/A 08/13/2018   HERNIA REPAIR UMBILICAL ADULT; polypropylene mesh reinforcement Surgeon: Robert Bellow, MD;  Location: [facility];  Service: General;  Laterality: N/A;    Social History:  reports that she has been smoking cigarettes. She has a 22.00 pack-year smoking history. She has never used smokeless tobacco. She reports current drug use. She reports that she does not drink alcohol.  Family History:  Family History  Problem Relation Age of Onset   Heart disease Mother    Stroke Mother    Coronary artery disease Mother    Lung cancer Sister    Cancer Sister    Breast cancer Sister    Alcohol abuse Father    Heart disease Father    Cancer Brother    Cancer Brother    Pneumonia Brother    Prostate cancer Neg Hx    Kidney cancer Neg Hx    Bladder Cancer Neg Hx      Prior to Admission medications   Medication Sig Start Date End Date Taking? Authorizing Provider  albuterol (VENTOLIN HFA) 108 (90 Base) MCG/ACT inhaler Inhale 2 puffs into the lungs every 6 (six) hours as needed for wheezing or shortness of breath. 10/23/19   Rudene Re, MD  ambrisentan (LETAIRIS) 5 MG tablet Take 5 mg by mouth daily. Patient not taking: Reported on 02/01/2022 01/13/22   [provider]  aspirin EC 81 MG tablet Take 81 mg by mouth daily.    [provider]  Cholecalciferol 50 MCG (2000 UT) CAPS Take 2,000 Units by mouth daily.     [provider]  Fluticasone-Umeclidin-Vilant  100-62.5-25 MCG/INH AEPB Inhale 1 puff into the lungs daily. 10/03/18   [provider]  HYDROcodone-acetaminophen (NORCO/VICODIN) 5-325 MG tablet Take 1 tablet by mouth every 8 (eight) hours as needed for severe pain. Must last 30 days 01/31/22 03/02/22  Milinda Pointer, MD  HYDROcodone-acetaminophen (NORCO/VICODIN) 5-325 MG tablet Take 1 tablet by mouth every 8 (eight) hours as needed for severe pain. Must last 30 days Patient not taking: Reported on 02/01/2022 03/02/22 04/01/22  Milinda Pointer, MD  HYDROcodone-acetaminophen (NORCO/VICODIN) 5-325 MG tablet Take 1 tablet by mouth every 8 (eight) hours as needed for severe pain. Must last 30 days Patient not taking: Reported on 02/01/2022 04/01/22 05/01/22  Milinda Pointer, MD  ipratropium-albuterol (DUONEB) 0.5-2.5 (3) MG/3ML  SOLN USE 1 VIAL VIA NEBULIZER EVERY 6 HOURS AS NEEDED FOR SHORTNESS OF BREATH OR WHEEZING 11/30/20   Chrismon, Vickki Muff, PA-C  levothyroxine (SYNTHROID) 25 MCG tablet TAKE 1 TABLET(25 MCG) BY MOUTH DAILY BEFORE BREAKFAST 01/03/22   Birdie Sons, MD  OXYGEN Inhale 2.5 L/min into the lungs.    [provider]  potassium chloride SA (KLOR-CON M) 20 MEQ tablet Take 2 tablets (40 mEq total) by mouth 2 (two) times daily. 01/06/22   [name], FNP  Roflumilast (DALIRESP) 250 MCG TABS Take 500 mcg by mouth daily as needed.    [provider]  sertraline (ZOLOFT) 100 MG tablet Take 2 tablets (200 mg total) by mouth every morning. 10/28/21   Birdie Sons, MD  torsemide (DEMADEX) 20 MG tablet Take 4 tablets QAM and 2 tablets QPM 03/25/22   [name], FNP    Physical Exam: Vitals:   04/01/22 1415 04/01/22 1430 04/01/22 1500 04/01/22 1515  BP: (!) 149/69 (!) 104/57 121/63   Pulse: (!) 105 (!) 103 (!) 101 (!) 101  Resp: 19 18    Temp:      TempSrc:      SpO2: 92% 92% 90% 91%  Weight:      Height:       General: Not in acute distress HEENT: Has occipital scalp tenderness       Eyes:  PERRL, EOMI, no scleral icterus.       ENT: No discharge from the ears and nose, no pharynx injection, no tonsillar enlargement.        Neck: Positive JVD, no bruit, no mass felt. Heme: No neck lymph node enlargement. Cardiac: S1/S2, RRR, No murmurs, No gallops or rubs. Respiratory: Has wheezing and crackles bilaterally GI: Soft, nondistended, nontender, no rebound pain, no organomegaly, BS present. GU: No hematuria Ext: 1+ pitting leg edema bilaterally. 1+DP/PT pulse bilaterally. Musculoskeletal: No joint deformities, No joint redness or warmth, no limitation of ROM in spin. Skin: No rashes.  Neuro: Alert, oriented X3, cranial nerves II-XII grossly intact, moves all extremities normally.  Psych: Patient is not psychotic, no suicidal or hemocidal ideation.  Labs on Admission: I have personally reviewed following labs and imaging studies  CBC: Recent Labs  Lab 04/01/22 0803  WBC 7.7  NEUTROABS 6.1  HGB 14.2  HCT 46.1*  MCV 99.6  PLT 920   Basic Metabolic Panel: Recent Labs  Lab 04/01/22 0803  NA 139  K 3.9  CL 103  CO2 30  GLUCOSE 106*  BUN 16  CREATININE 0.54  CALCIUM 9.3   GFR: Estimated Creatinine Clearance: 70.3 mL/min (by C-G formula based on SCr of 0.54 mg/dL). Liver Function Tests: Recent Labs  Lab 04/01/22 0803  AST 20  ALT 21  ALKPHOS 74  BILITOT 0.4  PROT 7.2  ALBUMIN 3.7   No results for input(s): "LIPASE", "AMYLASE" in the last 168 hours. No results for input(s): "AMMONIA" in the last 168 hours. Coagulation Profile: Recent Labs  Lab 04/01/22 0803  INR 0.9   Cardiac Enzymes: No results for input(s): "CKTOTAL", "CKMB", "CKMBINDEX", "TROPONINI" in the last 168 hours. BNP (last 3 results) No results for input(s): "PROBNP" in the last 8760 hours. HbA1C: No results for input(s): "HGBA1C" in the last 72 hours. CBG: No results for input(s): "GLUCAP" in the last 168 hours. Lipid Profile: No results for input(s): "CHOL", "HDL", "LDLCALC", "TRIG",  "CHOLHDL", "LDLDIRECT" in the last 72 hours. Thyroid Function Tests: No results for input(s): "TSH", "  T4TOTAL", "FREET4", "T3FREE", "THYROIDAB" in the last 72 hours. Anemia Panel: No results for input(s): "VITAMINB12", "FOLATE", "FERRITIN", "TIBC", "IRON", "RETICCTPCT" in the last 72 hours. Urine analysis:    Component Value Date/Time   COLORURINE STRAW (A) 04/01/2022 0803   APPEARANCEUR CLEAR (A) 04/01/2022 0803   APPEARANCEUR Cloudy (A) 11/28/2016 1128   LABSPEC 1.006 04/01/2022 0803   LABSPEC 1.011 06/12/2014 2003   PHURINE 6.0 04/01/2022 0803   GLUCOSEU NEGATIVE 04/01/2022 0803   GLUCOSEU Negative 06/12/2014 2003   HGBUR SMALL (A) 04/01/2022 0803   BILIRUBINUR NEGATIVE 04/01/2022 0803   BILIRUBINUR Negative 10/30/2019 1516   BILIRUBINUR Negative 11/28/2016 1128   BILIRUBINUR Negative 06/12/2014 2003   KETONESUR NEGATIVE 04/01/2022 0803   PROTEINUR NEGATIVE 04/01/2022 0803   UROBILINOGEN 0.2 10/30/2019 1516   UROBILINOGEN 0.2 07/30/2013 1305   NITRITE NEGATIVE 04/01/2022 0803   LEUKOCYTESUR NEGATIVE 04/01/2022 0803   LEUKOCYTESUR Negative 06/12/2014 2003   Sepsis Labs: @LABRCNTIP(procalcitonin:4,lacticidven:4) ) Recent Results (from the past 240 hour(s))  Resp Panel by RT-PCR (Flu A&B, Covid) Anterior Nasal Swab     Status: None   Collection Time: 04/01/22  8:03 AM   Specimen: Anterior Nasal Swab  Result Value Ref Range Status   SARS Coronavirus 2 by RT PCR NEGATIVE NEGATIVE Final    Comment: (NOTE) SARS-CoV-2 target nucleic acids are NOT DETECTED.  The SARS-CoV-2 RNA is generally detectable in upper respiratory specimens during the acute phase of infection. The lowest concentration of SARS-CoV-2 viral copies this assay can detect is 138 copies/mL. A negative result does not preclude SARS-Cov-2 infection and should not be used as the sole basis for treatment or other patient management decisions. A negative result may occur with  improper specimen  collection/handling, submission of specimen other than nasopharyngeal swab, presence of viral mutation(s) within the areas targeted by this assay, and inadequate number of viral copies(<138 copies/mL). A negative result must be combined with clinical observations, patient history, and epidemiological information. The expected result is Negative.  Fact Sheet for Patients:  https://www.fda.gov/media/152166/download  Fact Sheet for Healthcare Providers:  https://www.fda.gov/media/152162/download  This test is no t yet approved or cleared by the [place] FDA and  has been authorized for detection and/or diagnosis of SARS-CoV-2 by FDA under an Emergency Use Authorization (EUA). This EUA will remain  in effect (meaning this test can be used) for the duration of the COVID-19 declaration under Section 564(b)(1) of the Act, 21 U.S.C.section 360bbb-3(b)(1), unless the authorization is terminated  or revoked sooner.       Influenza A by PCR NEGATIVE NEGATIVE Final   Influenza B by PCR NEGATIVE NEGATIVE Final    Comment: (NOTE) The Xpert Xpress SARS-CoV-2/FLU/RSV plus assay is intended as an aid in the diagnosis of influenza from Nasopharyngeal swab specimens and should not be used as a sole basis for treatment. Nasal washings and aspirates are unacceptable for Xpert Xpress SARS-CoV-2/FLU/RSV testing.  Fact Sheet for Patients: https://www.fda.gov/media/152166/download  Fact Sheet for Healthcare Providers: https://www.fda.gov/media/152162/download  This test is not yet approved or cleared by the [place] FDA and has been authorized for detection and/or diagnosis of SARS-CoV-2 by FDA under an Emergency Use Authorization (EUA). This EUA will remain in effect (meaning this test can be used) for the duration of the COVID-19 declaration under Section 564(b)(1) of the Act, 21 U.S.C. section 360bbb-3(b)(1), unless the authorization is terminated or revoked.  Performed at [facility], [address] [place], [place] [address]      Radiological Exams on   Admission: CT HEAD WO CONTRAST (5MM)  Result Date: 04/01/2022 CLINICAL DATA:  65-year-old female presents with new or worsening headache. EXAM: CT HEAD WITHOUT CONTRAST TECHNIQUE: Contiguous axial images were obtained from the base of the skull through the vertex without intravenous contrast. RADIATION DOSE REDUCTION: This exam was performed according to the departmental dose-optimization program which includes automated exposure control, adjustment of the mA and/or kV according to patient size and/or use of iterative reconstruction technique. COMPARISON:  Comparison is made with October 11, 2019. FINDINGS: Brain: No evidence of acute infarction, hemorrhage, hydrocephalus, extra-axial collection or mass lesion/mass effect. Signs of mild atrophy and chronic microvascular ischemic change which is similar to previous imaging Vascular: No hyperdense vessel or unexpected calcification. Skull: Normal. Negative for fracture or focal lesion. Sinuses/Orbits: Visualized paranasal sinuses and orbits show no acute process to the extent evaluated. Other: None IMPRESSION: 1. No acute intracranial abnormality. 2. Signs of mild chronic microvascular ischemic change which is similar to previous imaging. Electronically Signed   By: Geoffrey  Wile M.D.   On: 04/01/2022 14:07   DG Chest Port 1 View  Result Date: 04/01/2022 CLINICAL DATA:  Questionable sepsis.  Shortness of breath. EXAM: PORTABLE CHEST 1 VIEW COMPARISON:  Chest radiographs 08/06/2021 FINDINGS: The cardiac silhouette is upper limits of normal in size. There is unchanged enlargement of the pulmonary arteries. Aortic atherosclerosis is noted. The lungs are hyperinflated with chronic peribronchial thickening and underlying emphysema. The interstitial markings are slightly more prominent diffusely compared to the prior study. No focal airspace consolidation, sizeable pleural  effusion, or pneumothorax is identified. No acute osseous abnormality is seen. IMPRESSION: COPD with superimposed mild edema or infection not excluded. Electronically Signed   By: Allen  Grady M.D.   On: 04/01/2022 08:31      Assessment/Plan Principal Problem:   Acute on chronic respiratory failure with hypoxia (HCC) Active Problems:   COPD exacerbation (HCC)   Acute on chronic diastolic CHF (congestive heart failure) (HCC)   Essential (primary) hypertension   Hypothyroidism   Depression with anxiety   Tobacco abuse   Sleep apnea   BiPAP (biphasic positive airway pressure) dependence   Chronic pain   Headache   Pulmonary hypertension (HCC)     Assessment and Plan: * Acute on chronic respiratory failure with hypoxia (HCC) Acute on chronic respiratory failure with hypoxia due to combination of CHF and COPD exacerbation.  Patient has 1+ leg edema, positive JVD, crackles on auscultation, pulm edema on chest x-ray, clinically consistent with CHF exacerbation.  Patient's BNP is 26.9 which is likely falsely low due to obesity. Patient has wheezing on auscultation, indicating COPD exacerbation.  -Admitted to PCU as inpatient -Bronchodilators -IV Lasix for CHF exacerbation -Nasal cannula oxygen to maintain oxygen saturation above 93%  COPD exacerbation (HCC) Patient has heart rate 96 and RR 23, partially due to CHF exacerbation, no fever or leukocytosis, lactic acid normal, sepsis is ruled out.  -Bronchodilators -Solu-Medrol 40 mg IV bid -Z pak  -Mucinex for cough  -Incentive spirometry -sputum culture -check RVP -Nasal cannula oxygen as needed to maintain O2 saturation 93% or greater  Acute on chronic diastolic CHF (congestive heart failure) (HCC) 2D echo on 08/08/2021 showed EF 60-65%. Now has CHF exacerbation. -Lasix 60 mg bid by IV -2d echo -Daily weights -strict I/O's -Low salt diet -Fluid restriction -Obtain REDs Vest reading  Essential (primary) hypertension - IV  hydralazine as needed -Patient is on IV Lasix   Hypothyroidism - Synthroid  Depression with anxiety - Continue   home medications  Tobacco abuse - Nicotine patch  Sleep apnea - BiPAP at night  BiPAP (biphasic positive airway pressure) dependence - BiPAP was ordered  Chronic pain - Continue home Norco as needed  Headache Etiology is not clear, likely due to occipital neuralgia, other differential diagnosis include subdural hematoma and giant cell arteritis. -Check ESR and CRP -Patient is on Solu-Medrol 40 mg twice daily which is for COPD -pt refused Neurontin - try lyrica 25 mg daily -Follow-up CT head  Pulmonary hypertension ([place]) -on Letairis             DVT ppx:   SQ Lovenox  Code Status: Full code  Family Communication:      Yes, patient's  husband  at bed side.    Disposition Plan:  Anticipate discharge back to previous environment  Consults called:  none  Admission status and Level of care: Progressive:     as inpt      Severity of Illness:  The appropriate patient status for this patient is INPATIENT. Inpatient status is judged to be reasonable and necessary in order to provide the required intensity of service to ensure the patient's safety. The patient's presenting symptoms, physical exam findings, and initial radiographic and laboratory data in the context of their chronic comorbidities is felt to place them at high risk for further clinical deterioration. Furthermore, it is not anticipated that the patient will be medically stable for discharge from the hospital within 2 midnights of admission.   * I certify that at the point of admission it is my clinical judgment that the patient will require inpatient hospital care spanning beyond 2 midnights from the point of admission due to high intensity of service, high risk for further deterioration and high frequency of surveillance required.*       Date of Service 04/01/2022    Ivor Costa [facility]   If 7PM-7AM, please contact night-coverage [email or web address] 04/01/2022, 4:46 PM

## 2022-04-01 NOTE — ED Notes (Signed)
Notified pharmacy tech to check pt's eye drops. Pt has also been prescribed toradol eyedrops.

## 2022-04-01 NOTE — ED Notes (Signed)
Pt took her own brofemnac eye drops, will hold toradol eye drops for 2200 dose. Informed pt that will need to dose from our formulary.

## 2022-04-01 NOTE — ED Triage Notes (Signed)
Patient c/o sob worse than normal for a few days and back head pain since Tuesday that has not gotten better. Patient wears 3L continuous Oxygen

## 2022-04-01 NOTE — Progress Notes (Addendum)
Pharmacy Consult for Pulmonary Hypertension Treatment   Indication - Continuation of prior to admission medication   Patient is 66 y.o. with history of PAH on chronic Ambrisentan PTA and will be continued while hospitalized. Her REMS ID [place] [id number] under an alternative last name ([place]).  Continuing this medication order as an inpatient requires that monitoring parameters per REMS requirements must be met.  Chronic therapy is under the supervision of Herbin Raul Del (ID [phone]) who is enrolled in the REMS program and is being notified of continuation of therapy.  Per patient report has previously been educated on Pregnancy Risk and Hepatotoxicity . On admission pregnancy risk has been assessed and no monitoring required. Hepatic function has been evaluated. AST/ALT appropriate to continue medication at this time.     Latest Ref Rng & Units 04/01/2022    8:03 AM 08/20/2021    1:41 PM 08/08/2021    3:44 AM  Hepatic Function  Total Protein 6.5 - 8.1 g/dL 7.2     Albumin 3.5 - 5.0 g/dL 3.7  4.3  3.2   AST 15 - 41 U/L 20     ALT 0 - 44 U/L 21     Alk Phosphatase 38 - 126 U/L 74     Total Bilirubin 0.3 - 1.2 mg/dL 0.4       If any question arise or pregnancy is identified during hospitalization, contact for bosentan and macitentan: [phone]; ambrisentan: [phone].  Thank for you allowing [place] to participate in the care of this patient.  Lorenso Courier, PharmD Clinical Pharmacist 04/01/2022 3:10 PM

## 2022-04-02 ENCOUNTER — Inpatient Hospital Stay
Admit: 2022-04-02 | Discharge: 2022-04-02 | Disposition: A | Discharge status: Home / Self Care | Payer: Medicare HMO | Attending: Internal Medicine | Admitting: Internal Medicine

## 2022-04-02 DIAGNOSIS — E669 Obesity, unspecified: Secondary | ICD-10-CM | POA: Diagnosis present

## 2022-04-02 DIAGNOSIS — J9621 Acute and chronic respiratory failure with hypoxia: Secondary | ICD-10-CM | POA: Diagnosis not present

## 2022-04-02 LAB — BASIC METABOLIC PANEL
Anion gap: 4 — ABNORMAL LOW (ref 5–15)
BUN: 25 mg/dL — ABNORMAL HIGH (ref 8–23)
CO2: 32 mmol/L (ref 22–32)
Calcium: 9.2 mg/dL (ref 8.9–10.3)
Chloride: 108 mmol/L (ref 98–111)
Creatinine, Ser: 0.61 mg/dL (ref 0.44–1.00)
GFR, Estimated: >60 mL/min (ref >60)
Glucose, Bld: 136 mg/dL — ABNORMAL HIGH (ref 70–99)
Potassium: 3.7 mmol/L (ref 3.5–5.1)
Sodium: 144 mmol/L (ref 135–145)

## 2022-04-02 LAB — ECHOCARDIOGRAM COMPLETE
AR max vel: 2.5 cm2
AV Area VTI: 2.58 cm2
AV Area mean vel: 2.45 cm2
AV Mean grad: 8 mmHg
AV Peak grad: 15.1 mmHg
Ao pk vel: 1.94 m/s
Area-P 1/2: 6.27 cm2
Height: 62 in
S' Lateral: 3.12 cm
Single Plane A4C EF: 62.9 %
Weight: 3241.6 oz

## 2022-04-02 LAB — URINE CULTURE: Culture: NO GROWTH

## 2022-04-02 LAB — MAGNESIUM: Magnesium: 2.6 mg/dL — ABNORMAL HIGH (ref 1.7–2.4)

## 2022-04-02 LAB — SEDIMENTATION RATE: Sed Rate: 33 mm/hr — ABNORMAL HIGH (ref 0–30)

## 2022-04-02 LAB — C-REACTIVE PROTEIN: CRP: 0.9 mg/dL (ref <1.0)

## 2022-04-02 MED ORDER — KETOROLAC TROMETHAMINE 15 MG/ML IJ SOLN
30.0000 mg | Freq: Four times a day (QID) | INTRAMUSCULAR | Status: DC | PRN
  Administered 2022-04-02 – 2022-04-04 (×4): 30 mg via INTRAVENOUS
  Filled 2022-04-02 (×5): qty 2

## 2022-04-02 MED ORDER — DIFLUPREDNATE 0.05 % OP EMUL
1.0000 [drp] | Freq: Four times a day (QID) | OPHTHALMIC | Status: AC
  Administered 2022-04-02 – 2022-04-04 (×12): 1 [drp] via OPHTHALMIC

## 2022-04-02 MED ORDER — BROMFENAC SODIUM 0.07 % OP SOLN
1.0000 [drp] | Freq: Every day | OPHTHALMIC | Status: DC
  Administered 2022-04-02 – 2022-04-08 (×7): 1 [drp] via OPHTHALMIC

## 2022-04-02 MED ORDER — ROFLUMILAST 500 MCG PO TABS
500.0000 ug | ORAL_TABLET | Freq: Every day | ORAL | Status: DC
  Administered 2022-04-02 – 2022-04-09 (×8): 500 ug via ORAL
  Filled 2022-04-02 (×8): qty 1

## 2022-04-02 MED ORDER — FLUTICASONE-UMECLIDIN-VILANT 100-62.5-25 MCG/INH IN AEPB: 1.0000 | INHALATION_SPRAY | Freq: Every day | RESPIRATORY_TRACT | Status: DC

## 2022-04-02 MED ORDER — METHYLPREDNISOLONE SODIUM SUCC 125 MG IJ SOLR
60.0000 mg | Freq: Two times a day (BID) | INTRAMUSCULAR | Status: DC
  Administered 2022-04-02 – 2022-04-06 (×8): 60 mg via INTRAVENOUS
  Filled 2022-04-02 (×9): qty 2

## 2022-04-02 MED ORDER — DIFLUPREDNATE 0.05 % OP EMUL: 1.0000 [drp] | Freq: Every day | OPHTHALMIC | Status: DC

## 2022-04-02 MED ORDER — DIFLUPREDNATE 0.05 % OP EMUL: 1.0000 [drp] | Freq: Two times a day (BID) | OPHTHALMIC | Status: DC

## 2022-04-02 MED ORDER — FUROSEMIDE 10 MG/ML IJ SOLN
40.0000 mg | Freq: Two times a day (BID) | INTRAMUSCULAR | Status: DC
  Administered 2022-04-02 – 2022-04-03 (×2): 40 mg via INTRAVENOUS
  Filled 2022-04-02 (×2): qty 4

## 2022-04-02 MED ORDER — METHYLPREDNISOLONE SODIUM SUCC 40 MG IJ SOLR
20.0000 mg | Freq: Once | INTRAMUSCULAR | Status: AC
Start: 2022-04-02 — End: 2022-04-02
  Administered 2022-04-02: 20 mg via INTRAVENOUS
  Filled 2022-04-02: qty 1

## 2022-04-02 MED ORDER — IPRATROPIUM-ALBUTEROL 0.5-2.5 (3) MG/3ML IN SOLN
3.0000 mL | Freq: Four times a day (QID) | RESPIRATORY_TRACT | Status: DC
  Administered 2022-04-02 – 2022-04-03 (×4): 3 mL via RESPIRATORY_TRACT
  Filled 2022-04-02 (×4): qty 3

## 2022-04-02 MED ORDER — UMECLIDINIUM BROMIDE 62.5 MCG/ACT IN AEPB
1.0000 | INHALATION_SPRAY | Freq: Every day | RESPIRATORY_TRACT | Status: DC
  Administered 2022-04-02 – 2022-04-08 (×7): 1 via RESPIRATORY_TRACT
  Filled 2022-04-02 (×3): qty 7

## 2022-04-02 MED ORDER — PREGABALIN 75 MG PO CAPS
75.0000 mg | ORAL_CAPSULE | Freq: Every day | ORAL | Status: DC
  Administered 2022-04-03: 75 mg via ORAL
  Filled 2022-04-02: qty 1

## 2022-04-02 MED ORDER — PREGABALIN 50 MG PO CAPS
50.0000 mg | ORAL_CAPSULE | Freq: Once | ORAL | Status: AC
Start: 2022-04-02 — End: 2022-04-02
  Administered 2022-04-02: 50 mg via ORAL
  Filled 2022-04-02: qty 1

## 2022-04-02 MED ORDER — FLUTICASONE FUROATE-VILANTEROL 100-25 MCG/ACT IN AEPB
1.0000 | INHALATION_SPRAY | Freq: Every day | RESPIRATORY_TRACT | Status: DC
  Administered 2022-04-02 – 2022-04-09 (×8): 1 via RESPIRATORY_TRACT
  Filled 2022-04-02: qty 28

## 2022-04-02 MED ORDER — KETOROLAC TROMETHAMINE 15 MG/ML IJ SOLN
15.0000 mg | Freq: Four times a day (QID) | INTRAMUSCULAR | Status: DC | PRN
  Administered 2022-04-02: 15 mg via INTRAVENOUS
  Filled 2022-04-02: qty 1

## 2022-04-02 MED ORDER — BESIFLOXACIN HCL 0.6 % OP SUSP
1.0000 [drp] | Freq: Three times a day (TID) | OPHTHALMIC | Status: DC
  Administered 2022-04-02 – 2022-04-09 (×22): 1 [drp] via OPHTHALMIC

## 2022-04-02 MED ORDER — DIFLUPREDNATE 0.05 % OP EMUL
1.0000 [drp] | Freq: Three times a day (TID) | OPHTHALMIC | Status: DC
  Administered 2022-04-05 – 2022-04-09 (×14): 1 [drp] via OPHTHALMIC

## 2022-04-02 NOTE — Plan of Care (Incomplete)
Temporary Nerve Blocks for Headaches / Facial pain Considered generally safe, relatively simple, and temporary procedures, nerve blocks are performed both to help diagnose and to treat various types of head and facial pain.  In terms of diagnosis: if numbing a nerve improves your symptoms, we know that the nerve is involved in generating your pain, keeping longer term nerve treatments on the table. If numbing a nerve does not improve your symptoms, we know the nerve is not involved and should be left alone.  In terms of treatment: Sometimes turning off a nerve for several hours can stop an individual headache in its tracks. Although numbness from nerve blocks only lasts hours, the benefits can occasionally last weeks or even months in terms of reducing ongoing pain or future headaches. They can sometimes put cluster headaches back into remission)  In order to feel pain, temperature, pressure, etc. in the head, information about these sensations must be transmitted from their source to the brain. Several different nerves accomplish this purpose in the face and in the scalp; in the face, various sensory branches of the trigeminal nerve, and in the scalp the greater and lesser occipital nerves. Each of these sensory nerve branches is paired, with one on the right and one on the left. Any can be "blocked" -- made numb with an injection of a local anesthetic near the origin of the nerve -- potentially relieving pain.  While some practitioners feel that the best candidates for nerve blocks are those patients with one-sided neuralgia-like pain (typically shooting, zapping, stinging, tingling or burning) that occurs exclusively in an area corresponding to a single nerve, we have had great success with patients with migraines, cluster headaches, post-neurosurgical pain, and a variety of other painful conditions, particularly when the nerves are found to be tender on examination.  Preparation  There are no  special steps you will need to take to prepare for these procedures, other than holding off on pain medication shortly before. In order for one of these somewhat unpleasant injections to be potentially worthwhile for you, as well as to help [place] assess any immediate benefits, it is helpful if you are in significant pain at the time of the injection, ideally rated at least 4/10 in severity. For patients suffering from intermittent but fairly frequent pain attacks but who are pain free at the time of the injection, we would hope to prevent any attacks expected within the next several hours.  Don't skip meals, and be sure to be well-hydrated.  Actual Procedure  You will be asked to lie down on an examination table. The skin or scalp nearest to the site of the injection will be cleaned with alcohol, and your provider will locate the origin of the nerve feel (it will often be quite tender). Using a thin needle, up to about a teaspoonful of a local anesthetic (with or without steroids, not used in face) is injected into the area in a fan-like spread through a single stick. Initial discomfort from the injection is comparable to a bee sting, but quickly diminishes. If the injection has been properly located, a significant area of skin or scalp on the injected side will go numb within minutes, extending far beyond the site of the actual injection, to the farthest reaches of the nerve. Pain may immediately improve. This numbness will then wear off over several hours, and, if there is a significant amount of inflammation present, the steroids may begin to work in the next few days to help  bring your pain under control for longer, sometimes weeks or months. You will be asked to note how the numbness affects your symptoms, and whether you feel that the injection resulted in more prolonged benefit.  If the nerve is "missed" by the anesthetic, the block will be unsuccessful, and no numbness or pain relief will occur. We  call this a "non-diagnostic procedure" if the nerve is not actually blocked, we cannot tell whether the nerve is involved in producing your pain. In general, if a block is nondiagnostic, we would immediately attempt a second injection at a nearby location, hopefully with better results. If you do go numb as expected, but your pain is not at all improved, then the nerve presumably has nothing to do with your pain syndrome.  For patients achieving good results with at least two temporary nerve blocks, there are a variety of more permanent pain-relieving procedures that can be performed by our Outpatient Pain Clinics, including pulsed radio-frequency ablation and placement of nerve stimulators. Another option is a nerve decompression surgery performed by a plastic surgeon. As a last resort, surgically cutting the nerve may considered. Note, however, that a good response to a nerve block does not necessarily mean that anything is actually "wrong" with your nerve, just that your pain condition involves the nerve in some way (example: migraines may irritate nerves, and irritated nerves can then contribute to further migraines). Decisions about more permanent procedures must be carefully considered, as all are considered aggressive and unproven, and because there may be "no going back" once they are performed, particularly if you suffer a complication. In general, we do not recommend any permanent procedure unless all reasonable medical options have been tried unsuccessfully.  Potential Complications of Temporary Nerve Blocks  Significant complications are very rare and almost never serious. Bleeding, infection, pain, and allergic or other adverse reaction to the anesthetic or steroid are possible. There is a slight chance for direct injury to the nerve at the site of the injection, but we are not aware of a single reported case in the medical literature. Some patients may experience bruising, swelling, soreness, or  even worsening of their pain for a few days after the numbness wears off. If steroids are injected into the scalp, there is an approximately 1% chance of a small area of the scalp at the site of the injection suffering from atrophy, a condition where a small sunken scar forms, with an accompanying area of hair loss. The needle stays outside the skull, so there is no chance of a spinal fluid leak, meningitis, or brain or spinal cord injury. The nerve branches we inject do not connect to any muscles, so weakness or change in facial expression will not occur.  Individual Nerve Blocks  [email or web address]   Greater Occipital Nerve  This important nerve begins in the scalp about 1/3 of the way between the bony bump at the back of your head (occipital tuberosity) and the bump behind your ear (mastoid), and supplies feeling (including pain) to a the majority of the scalp, sometimes nearly as far forward as the forehead. It generally does not involve the ear, and never involves the face directly, although sometimes this nerve can send pain (?oradiate?) to the area around the eye on the same side.  Lesser Occipital Nerve  This smaller nerve begins in the scalp about 2/3 of the way between the bony bump at the back of your head (occipital tuberosity) and the bump behind your ear (mastoid). Thus, the  resulting injection site is much closer to the ear than the location for a greater occipital nerve block. The lesser occipital nerve supplies feeling (including pain) to a small arc of skin and scalp just behind and above the ear, with branches sometimes reaching the back of the outer ear. Pain from this nerve can sometimes radiate toward the temple or eye.

## 2022-04-02 NOTE — Hospital Course (Signed)
66 y.o. female with medical history significant of dCHF, hypertension, COPD on 2.5 L oxygen, GERD, hypothyroidism, depression with anxiety, tobacco abuse, obesity with BMI 32.92, OSA with BiPAP dependency, chronic pain syndrome, pulmonary hypertension, who presented on 04/01/2022 for evaluation of severe persistent occipital headache with associated scalp tenderness since Tuesday (had cataract surgery Monday), also with with wheezing and shortness of breath worsening over a few days.  Admitted to the hospital and started on IV steroids for acute exacerbation COPD, IV diuresis given signs of CHF decompensation.  Started on trial of Lyrica for headache presumed due to occipital neuralgia.

## 2022-04-02 NOTE — Assessment & Plan Note (Signed)
Body mass index is 37.06 kg/m. Complicates overall care and prognosis.  Recommend lifestyle modifications including physical activity and diet for weight loss and overall long-term health.

## 2022-04-02 NOTE — Progress Notes (Signed)
Progress Note   Patient: Theresa [name] [id number]:[id number] DOB: 02-Jan-1956 DOA: 04/01/2022     1 DOS: the patient was seen and examined on 04/02/2022   Brief hospital course:  66 y.o. female with medical history significant of dCHF, hypertension, COPD on 2.5 L oxygen, GERD, hypothyroidism, depression with anxiety, tobacco abuse, obesity with BMI 32.92, OSA with BiPAP dependency, chronic pain syndrome, pulmonary hypertension, who presented on 04/01/2022 for evaluation of severe persistent occipital headache with associated scalp tenderness since Tuesday (had cataract surgery Monday), also with with wheezing and shortness of breath worsening over a few days.  Admitted to the hospital and started on IV steroids for acute exacerbation COPD, IV diuresis given signs of CHF decompensation.  Started on trial of Lyrica for headache presumed due to occipital neuralgia.  Assessment and Plan: * Acute on chronic respiratory failure with hypoxia (HCC) Acute on chronic respiratory failure with hypoxia due to combination of CHF and COPD exacerbation.  In addition to signs of CHF decompensation, patient also presented with significant wheezing and poor aeration on auscultation, indicating concurrent COPD with acute exacerbation.  At baseline, patient uses 2.5-3 L/min supplemental O2 due to pulmonary hypertension and COPD.  Patient reports recently completing a long course of steroids. On admission, was requiring up to 6 L/min with O2 sat 80% on 3 L/min O2 and progressive worsening shortness of breath.  6/17: Diffuse expiratory high-pitched wheezes, poor air movement on 4 L/min O2 --Follows with Dr. Raul Del, consulted -- Increase IV Solu-Medrol 40>> 60 mg twice daily -- Continue scheduled DuoNebs -- As needed albuterol nebs -- P.o. 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  No fevers.  CT head nonacute.  No temporal tenderness.  Has tenderness on palpation over occipital nerves bilaterally, R >L.  ESR minimally elevated 33.  CRP normal 0.9, less likely inflammatory etiology --Started on very low-dose Lyrica 25 mg on admission without any improvement (patient declined gabapentin) -- Will give Lyrica 50 mg today and increase to 75 mg daily tomorrow, if well-tolerated and helpful, may increase dose. -- Also on IV steroids for COPD exacerbation -- Discussed with neurology -- Consider occipital nerve block (patient given handout regarding this procedure) -- Trial ice packs  -- IV Toradol as needed  COPD exacerbation ([place]) Presented with tachycardia and tachypnea, expiratory wheezes and poor air movement on exam.  Unlikely infectious etiology given no fever or leukocytosis, lactic acid normal, no focal infiltrates on CXR.  Sepsis is ruled out. -- Management as outlined for acute on chronic respiratory failure  Acute on chronic diastolic CHF (congestive heart failure) ([place]) Echo on 08/08/2021 showed EF 60-65%.  Echo this admission with EF 55 to 30%, grade 1 diastolic dysfunction.  Presented with shortness of breath, pulmonary edema on chest x-ray, JVD, bibasilar crackles and lower extremity edema. --Reduce IV Lasix to 40 mg twice daily --Strict I/O's and daily weights --Low salt diet --Monitor renal function and electrolytes -- Follows with Dr. Clayborn Bigness, close follow-up recommended  Essential (primary) hypertension -- On IV Lasix as outlined ---IV hydralazine as needed -- On torsemide 80 mg morning, 40 mg evening  6/17: BPs on the soft side  Hypothyroidism Continue Synthroid  Depression with anxiety Continue home Zoloft  Tobacco abuse - Nicotine patch  Sleep apnea - BiPAP at night  BiPAP (biphasic positive airway  pressure) dependence -- BiPAP overnight/as needed  Chronic  pain -- Continue home Norco as needed -- Has acute pain related to severe headache   Obesity (BMI 30-39.9) Body mass index is 37.06 kg/m. Complicates overall care and prognosis.  Recommend lifestyle modifications including physical activity and diet for weight loss and overall long-term health.   Pulmonary hypertension ([place]) Continue Letairis. Follows with Dr. Raul Del, consulted.        Subjective: Patient awake resting in bed when seen on rounds.  She reports ongoing posterior headache with no improvement from Lyrica.  Says any vision changes are difficult due to cataract surgery last week.  Denies fevers chills.  Has severe tenderness on the back of her head but also more internal headache as well, pressure.  She had cataract surgery Monday, says the headache started Tuesday.  Regards to breathing, she states headache was her presenting complaint in the ER.  She felt her breathing was okay for her other than oxygen dropping a bit low.  She does endorse wheezing recently more.  Physical Exam: Vitals:   04/02/22 1127 04/02/22 1140 04/02/22 1143 04/02/22 1610  BP: 105/64   (!) 108/58  Pulse: 95   100  Resp: 17   17  Temp: 97.8 F (36.6 C)   97.7 F (36.5 C)  TempSrc: Oral     SpO2: 92% (!) 89% 93% 96%  Weight:      Height:       General exam: awake, alert, no acute distress HEENT: Wearing nasal cannula, moist mucus membranes, hearing grossly normal  Respiratory system: Diffuse high-pitched expiratory wheezes throughout, poor aeration, diminished bases, mildly increased respiratory effort.  On 4 L minute [place] O2 Cardiovascular system: normal S1/S2, RRR, no lower extremity edema.   Gastrointestinal system: soft, NT, ND, no HSM felt, +bowel sounds. Central nervous system: A&O x4. no gross focal neurologic deficits, normal speech Extremities: moves all, no edema, normal tone Skin: dry, intact, normal temperature Psychiatry: normal mood, congruent affect, judgement and insight  appear normal    Data Reviewed:  Notable labs: Glucose 136, BUN 25, magnesium 2.6, normal CRP 0.9, minimally elevated ESR 33, procalcitonin negative less than 0.10.  Respiratory PCR panel negative  Echocardiogram today --EF 55 to 67%, grade 1 diastolic dysfunction, no significant valvular disease  Family Communication: Patient's best friend was at bedside on rounds this morning  Disposition: Status is: Inpatient Remains inpatient appropriate because: Remains on IV steroids, IV diuretics, headache evaluation ongoing and pain uncontrolled    Planned Discharge Destination: Home      Time spent: 55 minutes including time spent at bedside and in coordination of care  Author: Ezekiel Slocumb, DO 04/02/2022 5:25 PM  For on call review [email or web address].

## 2022-04-02 NOTE — ED Notes (Signed)
Called floor to obtain handoff information for report.

## 2022-04-02 NOTE — Progress Notes (Signed)
*  PRELIMINARY RESULTS* Echocardiogram 2D Echocardiogram has been performed.  Theresa [name] 04/02/2022, 11:01 AM

## 2022-04-03 DIAGNOSIS — J9621 Acute and chronic respiratory failure with hypoxia: Secondary | ICD-10-CM | POA: Diagnosis not present

## 2022-04-03 MED ORDER — PREGABALIN 75 MG PO CAPS
150.0000 mg | ORAL_CAPSULE | Freq: Every day | ORAL | Status: DC
  Administered 2022-04-04 – 2022-04-05 (×2): 150 mg via ORAL
  Filled 2022-04-03 (×2): qty 2

## 2022-04-03 MED ORDER — PREGABALIN 75 MG PO CAPS
75.0000 mg | ORAL_CAPSULE | Freq: Once | ORAL | Status: AC
Start: 2022-04-03 — End: 2022-04-03
  Administered 2022-04-03: 75 mg via ORAL
  Filled 2022-04-03: qty 1

## 2022-04-03 MED ORDER — SALINE SPRAY 0.65 % NA SOLN: 1.0000 | NASAL | Status: DC | PRN

## 2022-04-03 MED ORDER — IPRATROPIUM-ALBUTEROL 0.5-2.5 (3) MG/3ML IN SOLN
3.0000 mL | Freq: Three times a day (TID) | RESPIRATORY_TRACT | Status: DC
  Administered 2022-04-03 – 2022-04-09 (×17): 3 mL via RESPIRATORY_TRACT
  Filled 2022-04-03 (×17): qty 3

## 2022-04-03 NOTE — Progress Notes (Signed)
Progress Note   Patient: Theresa [name] [id number]:[id number] DOB: 11-04-1955 DOA: 04/01/2022     2 DOS: the patient was seen and examined on 04/03/2022   Brief hospital course:  66 y.o. female with medical history significant of dCHF, hypertension, COPD on 2.5 L oxygen, GERD, hypothyroidism, depression with anxiety, tobacco abuse, obesity with BMI 32.92, OSA with BiPAP dependency, chronic pain syndrome, pulmonary hypertension, who presented on 04/01/2022 for evaluation of severe persistent occipital headache with associated scalp tenderness since Tuesday (had cataract surgery Monday), also with with wheezing and shortness of breath worsening over a few days.  Admitted to the hospital and started on IV steroids for acute exacerbation COPD, IV diuresis given signs of CHF decompensation.  Started on trial of Lyrica for headache presumed due to occipital neuralgia.  Assessment and Plan: * Acute on chronic respiratory failure with hypoxia (HCC) Acute on chronic respiratory failure with hypoxia due to combination of CHF and COPD exacerbation.  In addition to signs of CHF decompensation, patient also presented with significant wheezing and poor aeration on auscultation, indicating concurrent COPD with acute exacerbation.  At baseline, patient uses 2.5-3 L/min supplemental O2 due to pulmonary hypertension and COPD.  Patient reports recently completing a long course of steroids. On admission, was requiring up to 6 L/min with O2 sat 80% on 3 L/min O2 and progressive worsening shortness of breath.  6/17: Diffuse expiratory high-pitched wheezes, poor air movement on 4 L/min O2 --Follows with Dr. Raul Del, consulted -- Increase IV Solu-Medrol 40>> 60 mg twice daily -- Continue scheduled DuoNebs -- As needed albuterol nebs -- P.o. 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  No fevers.  CT head nonacute.  No temporal tenderness.  Has tenderness on palpation over occipital nerves bilaterally, R >L.  ESR minimally elevated 33.  CRP normal 0.9, less likely inflammatory etiology --Started on very low-dose Lyrica 25 mg on admission without any improvement (patient declined gabapentin due to profound side effects including frequent falls went on in the past, weaned herself off).  So far no side effects with Lyrica, patient agreeable to uptitrating since not yet effective   -- Increase Lyrica to 150 mg daily -- Also on IV steroids for COPD exacerbation -- Discussed with neurology -- Consider occipital nerve block (patient given handout regarding this procedure) at pain clinic -- Trial ice packs to posterior head -- IV Toradol as needed  COPD exacerbation ([place]) Presented with tachycardia and tachypnea, expiratory wheezes and poor air movement on exam.  Unlikely infectious etiology given no fever or leukocytosis, lactic acid normal, no focal infiltrates on CXR.  Sepsis is ruled out. -- Management as outlined for acute on chronic respiratory failure  Acute on chronic diastolic CHF (congestive heart failure) ([place]) Echo on 08/08/2021 showed EF 60-65%.  Echo this admission with EF 55 to 42%, grade 1 diastolic dysfunction.  Presented with shortness of breath, pulmonary edema on chest x-ray, JVD, bibasilar crackles and lower extremity edema.  Net IO Since Admission: -5,460 mL [04/03/22 1323] 6/18: 2900 cc output yesterday.  Appears euvolemic.  --Stop IV Lasix 40 mg twice daily --Resume home torsemide this evening (takes 80 mg in the morning, 40 in the evening) --Strict I/O's and daily weights --Low sodium diet --Monitor renal function and electrolytes -- Follows with Dr. Clayborn Bigness, close follow-up recommended  Essential (primary) hypertension -- Treated with IV Lasix, will resume home torsemide  this evening ---IV hydralazine as needed -- On torsemide 80 mg morning, 40 mg evening  6/18: BPs stable  Hypothyroidism Continue Synthroid  Depression with anxiety Continue home Zoloft  Tobacco abuse - Nicotine patch  Sleep apnea - BiPAP at night  BiPAP (biphasic positive airway pressure) dependence -- BiPAP overnight/as needed  Chronic pain -- Continue home Norco as needed -- Has acute pain related to severe headache   Obesity (BMI 30-39.9) Body mass index is 37.06 kg/m. Complicates overall care and prognosis.  Recommend lifestyle modifications including physical activity and diet for weight loss and overall long-term health.   Pulmonary hypertension ([place]) Continue Letairis. Follows with Dr. Raul Del, consulted.        Subjective: Patient awake resting in bed when seen on rounds.  She still has bad posterior headache and scalp tenderness, she states no better with Lyrica increased to 75 mg.  She says that she used to take gabapentin and had to wean herself off because of side effects which included frequent falling.  She is done better off of gabapentin.  She is open to trying increased dose of Lyrica since so far no side effects and hopefully this is not a long-term medication.  Hopes that nerve block could be done but does follow with the pain clinic and should be able to be seen there close follow-up if not able to do here.  Has not tried ice packs, not offered yet although ordered.  Physical Exam: Vitals:   04/03/22 0535 04/03/22 0732 04/03/22 0801 04/03/22 1207  BP: 121/73 113/64  120/71  Pulse: 75 73  94  Resp: _0 Temp: 98.1 F (36.7 C) 98 F (36.7 C)  98 F (36.7 C)  TempSrc:      SpO2: 100% 95% 93% 94%  Weight: 92.1 kg     Height:       General exam: awake, alert, no acute distress HEENT: Moist mucous membranes, hearing grossly normal, wearing nasal cannula Respiratory system: Improved aeration, expiratory wheezes anteriorly, diminished bases  bilaterally, normal respiratory effort at rest.  On 5 L minute [place] O2 Cardiovascular system: normal S1/S2, RRR, no lower extremity edema.   Central nervous system: A&O x4. no gross focal neurologic deficits, normal speech Extremities: moves all, no edema, normal tone Skin: dry, intact, normal temperature Psychiatry: normal mood, congruent affect, judgement and insight appear normal    Data Reviewed:  Notable labs: Glucose 136, anion gap 4, mag 2.7, CRP normal 0.9, ESR 33    Family Communication: none  Disposition: Status is: Inpatient Remains inpatient appropriate because: Remains on IV steroids, IV diuretics, headache evaluation ongoing and pain uncontrolled    Planned Discharge Destination: Home      Time spent: 35 minutes  Author: Ezekiel Slocumb, DO 04/03/2022 1:27 PM  For on call review [email or web address].

## 2022-04-03 NOTE — Progress Notes (Signed)
I was curb sided by Dr. Arbutus Ped about this patient, who I agree likely has occipital neuralgia based on Dr. Freddi Starr examination and history provided to me.  She may benefit from nerve block if ice packs to the head are not sufficient in relieving her pain, this may be completed on an outpatient basis.  She does not otherwise have alarm signs on history which would make me concerned that advanced imaging is needed, especially given the onset of this headache matches recent cataract surgery that may have led to nerve compression/irritation  Neurology is available if any additional questions or concerns arise or if a full consultation is felt to be needed  [place] [phone]

## 2022-04-03 NOTE — Plan of Care (Signed)

## 2022-04-04 ENCOUNTER — Inpatient Hospital Stay: Payer: Medicare HMO

## 2022-04-04 DIAGNOSIS — J9621 Acute and chronic respiratory failure with hypoxia: Secondary | ICD-10-CM | POA: Diagnosis not present

## 2022-04-04 LAB — CBC
HCT: 42.4 % (ref 36.0–46.0)
Hemoglobin: 13 g/dL (ref 12.0–15.0)
MCH: 31.5 pg (ref 26.0–34.0)
MCHC: 30.7 g/dL (ref 30.0–36.0)
MCV: 102.7 fL — ABNORMAL HIGH (ref 80.0–100.0)
Platelets: 164 10*3/uL (ref 150–400)
RBC: 4.13 MIL/uL (ref 3.87–5.11)
RDW: 15.9 % — ABNORMAL HIGH (ref 11.5–15.5)
WBC: 8.3 10*3/uL (ref 4.0–10.5)
nRBC: 0 % (ref 0.0–0.2)

## 2022-04-04 LAB — BASIC METABOLIC PANEL
Anion gap: 4 — ABNORMAL LOW (ref 5–15)
BUN: 31 mg/dL — ABNORMAL HIGH (ref 8–23)
CO2: 32 mmol/L (ref 22–32)
Calcium: 9.2 mg/dL (ref 8.9–10.3)
Chloride: 108 mmol/L (ref 98–111)
Creatinine, Ser: 0.64 mg/dL (ref 0.44–1.00)
GFR, Estimated: >60 mL/min (ref >60)
Glucose, Bld: 137 mg/dL — ABNORMAL HIGH (ref 70–99)
Potassium: 4.9 mmol/L (ref 3.5–5.1)
Sodium: 144 mmol/L (ref 135–145)

## 2022-04-04 MED ORDER — KETOROLAC TROMETHAMINE 30 MG/ML IJ SOLN
30.0000 mg | Freq: Four times a day (QID) | INTRAMUSCULAR | Status: DC | PRN
  Administered 2022-04-04 – 2022-04-05 (×2): 30 mg via INTRAVENOUS
  Filled 2022-04-04 (×2): qty 1

## 2022-04-04 NOTE — TOC Initial Note (Signed)
Transition of Care [facility]) - Initial/Assessment Note    Patient Details  Name: Theresa [name] MRN: [id number] Date of Birth: 1955-11-29  Transition of Care [facility]) CM/SW Contact:    Laurena Slimmer, RN Phone Number: 04/04/2022, 9:47 AM  Clinical Narrative:                  Transition of Care [facility]) Screening Note   Patient Details  Name: Theresa [name] Date of Birth: 11/24/1955   Transition of Care [facility]) CM/SW Contact:    Laurena Slimmer, RN Phone Number: 04/04/2022, 9:47 AM    Transition of Care Department [facility]) has reviewed patient and no TOC needs have been identified at this time. We will continue to monitor patient advancement through interdisciplinary progression rounds. If new patient transition needs arise, please place a TOC consult.          Patient Goals and CMS Choice        Expected Discharge Plan and Services                                                Prior Living Arrangements/Services                       Activities of Daily Living Home Assistive Devices/Equipment: Oxygen ADL Screening (condition at time of admission) Patient's cognitive ability adequate to safely complete daily activities?: Yes Is the patient deaf or have difficulty hearing?: No Does the patient have difficulty seeing, even when wearing glasses/contacts?: No Does the patient have difficulty concentrating, remembering, or making decisions?: No Patient able to express need for assistance with ADLs?: Yes Does the patient have difficulty dressing or bathing?: No Independently performs ADLs?: Yes (appropriate for developmental age) Communication: Independent Dressing (OT): Independent Grooming: Independent Feeding: Independent Bathing: Independent Toileting: Independent In/Out Bed: Independent Walks in Home: Independent Does the patient have difficulty walking or climbing stairs?: Yes Weakness of Legs: None Weakness of Arms/Hands: None  Permission  Sought/Granted                  Emotional Assessment              Admission diagnosis:  COPD exacerbation ([place]) [J44.1] Acute on chronic respiratory failure with hypoxia ([place]) [J96.21] Acute on chronic congestive heart failure, unspecified heart failure type ([place]) [I50.9] Patient Active Problem List   Diagnosis Date Noted   Obesity (BMI 30-39.9) 04/02/2022   Headache 04/01/2022   Pulmonary hypertension ([place]) 04/01/2022   BiPAP (biphasic positive airway pressure) dependence    Acute on chronic diastolic CHF (congestive heart failure) ([place])    Hypothyroidism    Depression with anxiety    Chronic pain    COPD exacerbation ([place]) 08/06/2021   Leukocytosis 08/06/2021   Encounter for tobacco use cessation counseling 08/06/2021   Chronic shoulder pain (Bilateral) 05/05/2021   Chronic use of opiate for therapeutic purpose 01/26/2021   Left thigh pain 82/95/6213   Uncomplicated opioid dependence ([place]) 04/08/2020   Pharmacologic therapy 04/08/2020   Numbness and tingling in right hand 12/31/2019   Numbness in feet 12/31/2019   Altered consciousness 11/26/2019   Frequent falls 11/26/2019   Weakness of both lower extremities 11/26/2019   Lumbar disc extrusion (L2-3) (Left) 09/10/2019   Chronic knee pain (Left) 08/14/2019   Chronic left-sided lumbar radiculopathy 08/14/2019   Abnormal MRI, lumbar  spine (07/23/2019) 08/14/2019   Acute on chronic respiratory failure with hypoxia (HCC) 07/06/2019   Cervicalgia (Bilateral) (R>L) 09/24/2018   Spondylosis without myelopathy or radiculopathy, lumbosacral region 09/24/2018   Epigastric hernia 05/04/2018   Chronic lower extremity pain (Bilateral) (L>R) 01/25/2018   Altered mental status, unspecified 12/09/2017   Chronic hip pain (3ry area of Pain) (Bilateral) (L>R) 11/27/2017   COPD (chronic obstructive pulmonary disease) ([place]) 08/11/2017   Other long term (current) drug therapy 08/08/2017   Disorder of bone, unspecified 08/08/2017    COPD with hypoxia ([place]) 06/27/2017   Arthralgia of acromioclavicular joint (Right) 06/15/2017   Acromioclavicular joint DJD (Right) 06/15/2017   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   Chronic shoulder pain (Left) 05/11/2017   Elevated brain natriuretic peptide (BNP) level 05/11/2017   Grief at loss of child 02/14/2017   Lumbar facet hypertrophy (multilevel) 11/17/2016   Lumbar spinal stenosis (L4-5) 11/17/2016   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) 11/17/2016   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) 11/17/2016   Lumbosacral radiculopathy at L4 11/17/2016   Supplemental oxygen dependent 08/01/2016   Sleep apnea 06/02/2016   Long term current use of opiate analgesic 01/12/2016   Long term prescription opiate use 01/12/2016   Opiate use (15 MME/Day) 01/12/2016   Lumbar facet syndrome (Bilateral) (L>R) 01/12/2016   Chronic sacroiliac joint pain (Bilateral) 01/12/2016   Vitamin D deficiency 12/30/2015   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) 12/28/2015   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   Coagulation disorder ([place]) 10/22/2015   Chronic pain syndrome (significant psychosocial component) 09/21/2015   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 09/21/2015   Encounter for therapeutic drug level monitoring 09/16/2015   Encounter for chronic pain management 09/16/2015   Pain management 09/16/2015   Neurogenic pain 09/16/2015   Neuropathic pain 09/16/2015   Musculoskeletal pain 09/16/2015   Lumbar spondylosis (L4-5) 09/16/2015   Chronic low back pain (1ry area of Pain) (Bilateral) (L>R) w/o sciatica 09/16/2015   Chronic lower extremity pain (2ry area of Pain) (Left) 09/16/2015   Chronic lumbar radicular pain (L4 & S1 Dermatome) (Left) 09/16/2015   Chronic shoulder pain (Right side) 09/16/2015   Chronic upper extremity pain (Right-sided) 09/16/2015   Cervical radiculitis (Right side) 09/16/2015   Abnormal x-ray  of lumbar spine 09/16/2015   Chronic diastolic heart failure ([place]) 07/15/2015   Chest pain 07/15/2015   Tobacco abuse 07/15/2015   Tendinitis 04/21/2015   Blood clotting disorder ([place]) 04/21/2015   Decreased motor strength 07/16/2014   Clinical depression 07/16/2014   Chronic rhinitis 07/16/2014   Carpal tunnel syndrome 07/16/2014   Anxiety state 07/16/2014   Nausea without vomiting 07/16/2014   Edema 07/16/2014   Generalized muscle weakness 07/16/2014   Neuritis or radiculitis due to rupture of lumbar intervertebral disc 07/02/2014   DDD (degenerative disc disease), lumbar 07/02/2014   CAFL (chronic airflow limitation) ([place]) 02/27/2014   Herpes zoster 07/16/2009   Essential (primary) hypertension 03/27/2008   PCP:  Birdie Sons, MD Pharmacy:   [facility], [address] [place] [address] Phone: [phone] Fax: [phone]     Social Determinants of Health (SDOH) Interventions    Readmission Risk Interventions    08/09/2021   10:43 AM 02/05/2020    9:53 AM 07/13/2019   10:57 AM  Readmission Risk Prevention Plan  Transportation Screening Complete Complete Complete  PCP or Specialist Appt within 5-7 Days   Complete  PCP or  Specialist Appt within 3-5 Days Complete Complete   Home Care Screening   Complete  Medication Review (RN CM)   Complete  HRI or Home Care Consult Complete Complete   Social Work Consult for [place] Planning/Counseling Complete    Palliative Care Screening Not Applicable    Medication Review Press photographer) Complete Complete

## 2022-04-04 NOTE — Progress Notes (Addendum)
Progress Note   Patient: Theresa [name] NIO:[id number] DOB: May 07, 1956 DOA: 04/01/2022     3 DOS: the patient was seen and examined on 04/04/2022   Brief hospital course:  66 y.o. female with medical history significant of dCHF, hypertension, COPD on 2.5 L oxygen, GERD, hypothyroidism, depression with anxiety, tobacco abuse, obesity with BMI 32.92, OSA with BiPAP dependency, chronic pain syndrome, pulmonary hypertension, who presented on 04/01/2022 for evaluation of severe persistent occipital headache with associated scalp tenderness since Tuesday (had cataract surgery Monday), also with with wheezing and shortness of breath worsening over a few days.  Admitted to the hospital and started on IV steroids for acute exacerbation COPD, IV diuresis given signs of CHF decompensation.  Started on trial of Lyrica for headache presumed due to occipital neuralgia.  Assessment and Plan: * Acute on chronic respiratory failure with hypoxia (HCC) Acute on chronic respiratory failure with hypoxia due to combination of CHF and COPD exacerbation.  In addition to signs of CHF decompensation, patient also presented with significant wheezing and poor aeration on auscultation, indicating concurrent COPD with acute exacerbation.  At baseline, patient uses 2.5-3 L/min supplemental O2 due to pulmonary hypertension and COPD.  Patient reports recently completing a long course of steroids. On admission, was requiring up to 6 L/min with O2 sat 80% on 3 L/min O2 and progressive worsening shortness of breath.  6/17: Diffuse expiratory high-pitched wheezes, poor air movement on 4 L/min O2 6/19: Still on 4 L/min, wheezing improved Pulmonology recommendations pending  --Follows with Dr. Raul Del, consulted -- Continue IV Solu-Medrol 60 mg twice daily -- Continue scheduled DuoNebs -- As needed albuterol nebs -- P.o. 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  No fevers.  CT head nonacute.  No temporal tenderness.  Has tenderness on palpation over occipital nerves bilaterally, R >L.  ESR minimally elevated 33.  CRP normal 0.9, less likely inflammatory etiology --Started on very low-dose Lyrica 25 mg on admission without any improvement (patient declined gabapentin due to profound side effects including frequent falls went on in the past, weaned herself off).  So far no side effects with Lyrica, patient agreeable to uptitrating since not yet effective  --Will get MRI brain given headache persistence and patient reporting throbbing pulsatile nature this morning. -- Increase Lyrica to 150 mg daily -- Also on IV steroids for COPD exacerbation -- Discussed with neurology -- Consider occipital nerve block (patient given handout regarding this procedure) at pain clinic -- Trial ice packs to posterior head -- IV Toradol as needed  COPD exacerbation ([place]) Presented with tachycardia and tachypnea, expiratory wheezes and poor air movement on exam.  Unlikely infectious etiology given no fever or leukocytosis, lactic acid normal, no focal infiltrates on CXR.  Sepsis is ruled out. -- Management as outlined for acute on chronic respiratory failure  Acute on chronic diastolic CHF (congestive heart failure) ([place]) Echo on 08/08/2021 showed EF 60-65%.  Echo this admission with EF 55 to 81%, grade 1 diastolic dysfunction.  Presented with shortness of breath, pulmonary edema on chest x-ray, JVD, bibasilar crackles and lower extremity edema.  Net IO Since Admission: -5,460 mL [04/03/22 1323] 6/18: 2900 cc output yesterday.  Appears euvolemic.  --Stop IV Lasix 40 mg twice daily --Resume home torsemide this evening (takes 80 mg in the morning, 40 in the  evening) --Strict I/O's and daily weights --Low sodium diet --Monitor  renal function and electrolytes -- Follows with Dr. Clayborn Bigness, close follow-up recommended    Essential (primary) hypertension -- Treated with IV Lasix, will resume home torsemide this evening ---IV hydralazine as needed -- On torsemide 80 mg morning, 40 mg evening  6/18: BPs stable  Hypothyroidism Continue Synthroid  Depression with anxiety Continue home Zoloft  Tobacco abuse - Nicotine patch  Sleep apnea - BiPAP at night  BiPAP (biphasic positive airway pressure) dependence -- BiPAP overnight/as needed  Chronic pain -- Continue home Norco as needed -- Has acute pain related to severe headache   Obesity (BMI 30-39.9) Body mass index is 37.06 kg/m. Complicates overall care and prognosis.  Recommend lifestyle modifications including physical activity and diet for weight loss and overall long-term health.   Pulmonary hypertension ([place]) Continue Letairis. Follows with Dr. Raul Del, consulted.        Subjective: Patient awake resting in bed when seen on rounds.  She continues to have posterior scalp and head tenderness along with headache she says is throbbing and pounding at times.  She is worried about what is causing this, has never had headaches like this before.  She said the ice pack does help somewhat but becomes too cold for her neck.  No other acute complaints at this time.    Physical Exam: Vitals:   04/04/22 0414 04/04/22 0758 04/04/22 0940 04/04/22 1124  BP: 120/75  (!) 145/91 (!) 112/56  Pulse: 79  87 83  Resp: 18   17  Temp: 97.9 F (36.6 C)  (!) 97.4 F (36.3 C) 98 F (36.7 C)  TempSrc:    Oral  SpO2: 98% 98% 92% 91%  Weight:      Height:       General exam: Awake and alert, no acute distress but occasionally wincing in pain HEENT: Tenderness on occipital palpation, moist mucous membranes, hearing grossly normal, nasal cannula in place Respiratory system: Currently without wheezing, remains diminished throughout but overall clear.  On 4 L/min  [place] O2 Cardiovascular system: regular rate and rhythm, no lower extremity edema.   Central nervous system: A&O x4. no gross focal neurologic deficits, normal speech Extremities: moves all, no edema, normal tone Skin: dry, intact, normal temperature, no rashes seen Psychiatry: normal mood, congruent affect, judgement and insight appear normal    Data Reviewed:  Notable labs:  Glucose 137, BUN 31, anion gap 4  MRI brain pending   Family Communication: none  Disposition: Status is: Inpatient Remains inpatient appropriate because: Remains on IV steroids, headache evaluation ongoing and pain uncontrolled    Planned Discharge Destination: Home      Time spent: 35 minutes  Author: Ezekiel Slocumb, DO 04/04/2022 2:37 PM  For on call review [email or web address].

## 2022-04-04 NOTE — Plan of Care (Signed)

## 2022-04-05 ENCOUNTER — Inpatient Hospital Stay: Payer: Medicare HMO

## 2022-04-05 DIAGNOSIS — J9621 Acute and chronic respiratory failure with hypoxia: Secondary | ICD-10-CM | POA: Diagnosis not present

## 2022-04-05 LAB — BRAIN NATRIURETIC PEPTIDE: B Natriuretic Peptide: 101.6 pg/mL — ABNORMAL HIGH (ref 0.0–100.0)

## 2022-04-05 MED ORDER — FUROSEMIDE 10 MG/ML IJ SOLN
40.0000 mg | Freq: Two times a day (BID) | INTRAMUSCULAR | Status: DC
  Administered 2022-04-05 (×2): 40 mg via INTRAVENOUS
  Filled 2022-04-05 (×2): qty 4

## 2022-04-05 MED ORDER — AZITHROMYCIN 250 MG PO TABS
250.0000 mg | ORAL_TABLET | Freq: Every day | ORAL | Status: DC
  Administered 2022-04-06 – 2022-04-08 (×3): 250 mg via ORAL
  Filled 2022-04-05 (×3): qty 1

## 2022-04-05 MED ORDER — PREGABALIN 75 MG PO CAPS
150.0000 mg | ORAL_CAPSULE | Freq: Two times a day (BID) | ORAL | Status: DC
  Administered 2022-04-05 – 2022-04-09 (×8): 150 mg via ORAL
  Filled 2022-04-05 (×8): qty 2

## 2022-04-05 MED ORDER — SPIRONOLACTONE 25 MG PO TABS
50.0000 mg | ORAL_TABLET | Freq: Every day | ORAL | Status: DC
  Administered 2022-04-05 – 2022-04-09 (×5): 50 mg via ORAL
  Filled 2022-04-05 (×5): qty 2

## 2022-04-05 MED ORDER — ORAL CARE MOUTH RINSE: 15.0000 mL | OROMUCOSAL | Status: DC | PRN

## 2022-04-05 MED ORDER — FUROSEMIDE 10 MG/ML IJ SOLN
60.0000 mg | Freq: Two times a day (BID) | INTRAMUSCULAR | Status: DC
  Administered 2022-04-06: 60 mg via INTRAVENOUS
  Filled 2022-04-05: qty 6

## 2022-04-05 MED ORDER — BUTALBITAL-APAP-CAFFEINE 50-325-40 MG PO TABS
1.0000 | ORAL_TABLET | Freq: Four times a day (QID) | ORAL | Status: DC | PRN
  Administered 2022-04-05 – 2022-04-09 (×10): 1 via ORAL
  Filled 2022-04-05 (×10): qty 1

## 2022-04-05 NOTE — Plan of Care (Signed)
  Problem: Education: Goal: Knowledge of General Education information will improve Description: Including pain rating scale, medication(s)/side effects and non-pharmacologic comfort measures Outcome: Progressing   Problem: Health Behavior/Discharge Planning: Goal: Ability to manage health-related needs will improve Outcome: Progressing   Problem: Clinical Measurements: Goal: Will remain free from infection Outcome: Progressing Goal: Diagnostic test results will improve Outcome: Progressing Goal: Respiratory complications will improve Outcome: Progressing

## 2022-04-05 NOTE — Progress Notes (Signed)
Progress Note   Patient: Theresa [name] [id number] DOB: 1955/11/23 DOA: 04/01/2022     4 DOS: the patient was seen and examined on 04/05/2022   Brief hospital course:  66 y.o. female with medical history significant of dCHF, hypertension, COPD on 2.5 L oxygen, GERD, hypothyroidism, depression with anxiety, tobacco abuse, obesity with BMI 32.92, OSA with BiPAP dependency, chronic pain syndrome, pulmonary hypertension, who presented on 04/01/2022 for evaluation of severe persistent occipital headache with associated scalp tenderness since Tuesday (had cataract surgery Monday), also with with wheezing and shortness of breath worsening over a few days.  Admitted to the hospital and started on IV steroids for acute exacerbation COPD, IV diuresis given signs of CHF decompensation.  Started on trial of Lyrica for headache presumed due to occipital neuralgia.  Assessment and Plan: * Acute on chronic respiratory failure with hypoxia (HCC) Acute on chronic respiratory failure with hypoxia due to combination of CHF and COPD exacerbation.  In addition to signs of CHF decompensation, patient also presented with significant wheezing and poor aeration on auscultation, indicating concurrent COPD with acute exacerbation.  At baseline, patient uses 2.5-3 L/min supplemental O2 due to pulmonary hypertension and COPD.  Patient reports recently completing a long course of steroids. On admission, was requiring up to 6 L/min with O2 sat 80% on 3 L/min O2 and progressive worsening shortness of breath.  6/17: Diffuse expiratory high-pitched wheezes, poor air movement on 4 L/min O2 6/19: Still on 4-5 L/min, wheezing improved Pulmonology recommendations pending  --Follows with Dr. Raul Del, consulted -- Continue IV Solu-Medrol 60 mg twice daily -- Continue scheduled DuoNebs -- As needed albuterol nebs -- P.o. 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  No fevers.  CT head nonacute.  No temporal tenderness.  Has tenderness on palpation over occipital nerves bilaterally, R >L.  ESR minimally elevated 33.  CRP normal 0.9, less likely inflammatory etiology --Started on very low-dose Lyrica 25 mg on admission without any improvement (patient declined gabapentin due to profound side effects including frequent falls went on in the past, weaned herself off).  So far no side effects with Lyrica, patient agreeable to uptitrating since not yet effective 6/20 - MRI brain negative -- Increase Lyrica to 150 mg daily >>BID -- Also on IV steroids for COPD exacerbation -- Discussed with neurology -- Occipital nerve block recommended at pain clinic in close follow up if persistent (unable to get this done inpatient) -- Ice packs to posterior head -- Fioricet PRN  COPD exacerbation ([place]) Presented with tachycardia and tachypnea, expiratory wheezes and poor air movement on exam.  Unlikely infectious etiology given no fever or leukocytosis, lactic acid normal, no focal infiltrates on CXR.  Sepsis is ruled out. -- Management as outlined for acute on chronic respiratory failure  Acute on chronic diastolic CHF (congestive heart failure) ([place]) Echo on 08/08/2021 showed EF 60-65%.  Echo this admission with EF 55 to 48%, grade 1 diastolic dysfunction.  Presented with shortness of breath, pulmonary edema on chest x-ray, JVD, bibasilar crackles and lower extremity edema.  Net IO Since Admission: -8,730 mL [04/05/22 1552]  6/18: 2900 cc output yesterday.  Appears euvolemic.  Transitioned back to torsemide 6/20: still needs 4-5 L O2 above baseline, wt is up.  BNP up and CXR with edema   --Resume Lasix 40  mg twice daily --Hold home torsemide this evening (takes 80 mg in the morning, 40 in the evening) --Strict I/O's and  daily weights --Low sodium diet --Monitor renal function and electrolytes -- Follows with Dr. Clayborn Bigness, close follow-up recommended    Essential (primary) hypertension -- Treated with IV Lasix -- Home torsemide on hold ---IV hydralazine as needed  BPs stable  Hypothyroidism Continue Synthroid  Depression with anxiety Continue home Zoloft  Tobacco abuse - Nicotine patch  Sleep apnea - BiPAP at night  BiPAP (biphasic positive airway pressure) dependence -- BiPAP overnight/as needed  Chronic pain -- Continue home Norco as needed -- Has acute pain related to severe headache   Obesity (BMI 30-39.9) Body mass index is 37.06 kg/m. Complicates overall care and prognosis.  Recommend lifestyle modifications including physical activity and diet for weight loss and overall long-term health.   Pulmonary hypertension ([place]) Continue Letairis. Follows with Dr. Raul Del, consulted.        Subjective: Patient seen with husband at bedside today.  She reports the scalp tenderness is improving but having persistent shooting pains at back of head.  Does not seem improved with Lyrica.  She denies any side effects similar to those she experienced from gabapentin since we've been increasing Lyrica, agreeable to try further dose increase.  Physical Exam: Vitals:   04/05/22 0749 04/05/22 0750 04/05/22 1228 04/05/22 1421  BP:  133/83 137/70   Pulse:  82 96   Resp:  20 18   Temp:  98.6 F (37 C) 98.4 F (36.9 C)   TempSrc:  Oral Oral   SpO2: 97% 97% 91% 93%  Weight:      Height:       General exam: Awake and alert, no acute distress  HEENT: Tenderness on occipital palpation, moist mucous membranes, hearing grossly normal, nasal cannula in place Respiratory system: coarse crackles, no expiratory wheezing, on 5 L/min O2 Cardiovascular system: regular rate and rhythm, no lower extremity edema.   Central nervous system: A&O x4. no gross focal neurologic deficits, normal  speech Extremities: moves all, no edema, normal tone Psychiatry: normal mood, congruent affect, judgement and insight appear normal    Data Reviewed:  Notable labs:  BNP 101.6   MRI brain negative for acute findings or cause for headache.   Family Communication: none  Disposition: Status is: Inpatient Remains inpatient appropriate because: Remains on IV steroids, IV diuresis    Planned Discharge Destination: Home      Time spent: 35 minutes  Author: Ezekiel Slocumb, DO 04/05/2022 3:59 PM  For on call review [email or web address].

## 2022-04-05 NOTE — Plan of Care (Signed)
  Problem: Education: Goal: Knowledge of General Education information will improve Description: Including pain rating scale, medication(s)/side effects and non-pharmacologic comfort measures Outcome: Progressing   Problem: Health Behavior/Discharge Planning: Goal: Ability to manage health-related needs will improve Outcome: Progressing   Problem: Clinical Measurements: Goal: Ability to maintain clinical measurements within normal limits will improve Outcome: Progressing Goal: Will remain free from infection Outcome: Progressing Goal: Diagnostic test results will improve Outcome: Progressing Goal: Respiratory complications will improve Outcome: Progressing Goal: Cardiovascular complication will be avoided Outcome: Progressing   Problem: Activity: Goal: Risk for activity intolerance will decrease Outcome: Progressing   Problem: Nutrition: Goal: Adequate nutrition will be maintained Outcome: Progressing   Problem: Coping: Goal: Level of anxiety will decrease Outcome: Progressing   Problem: Elimination: Goal: Will not experience complications related to bowel motility Outcome: Progressing Goal: Will not experience complications related to urinary retention Outcome: Progressing   Problem: Pain Managment: Goal: General experience of comfort will improve Outcome: Progressing   Problem: Safety: Goal: Ability to remain free from injury will improve Outcome: Progressing   Problem: Skin Integrity: Goal: Risk for impaired skin integrity will decrease Outcome: Progressing   Problem: Education: Goal: Ability to demonstrate management of disease process will improve Outcome: Progressing Goal: Ability to verbalize understanding of medication therapies will improve Outcome: Progressing Goal: Individualized Educational Video(s) Outcome: Progressing   Problem: Activity: Goal: Capacity to carry out activities will improve Outcome: Progressing   Problem: Cardiac: Goal:  Ability to achieve and maintain adequate cardiopulmonary perfusion will improve Outcome: Progressing   Problem: Education: Goal: Knowledge of disease or condition will improve Outcome: Progressing Goal: Knowledge of the prescribed therapeutic regimen will improve Outcome: Progressing Goal: Individualized Educational Video(s) Outcome: Progressing   Problem: Activity: Goal: Ability to tolerate increased activity will improve Outcome: Progressing Goal: Will verbalize the importance of balancing activity with adequate rest periods Outcome: Progressing   Problem: Respiratory: Goal: Ability to maintain a clear airway will improve Outcome: Progressing Goal: Levels of oxygenation will improve Outcome: Progressing Goal: Ability to maintain adequate ventilation will improve Outcome: Progressing

## 2022-04-05 NOTE — Consult Note (Signed)
PULMONOLOGY         Date: 04/05/2022,   MRN# [id number] Theresa [name] December 06, 1955     AdmissionWeight: 81.6 kg                 CurrentWeight: 95 kg  Referring provider: Dr. Arbutus Ped   CHIEF COMPLAINT:   Acute on chronic hypoxemic respiratory failure with hypercapnia   HISTORY OF PRESENT ILLNESS   This is a very pleasant 66 year old female who has a history of CHF with advanced COPD and chronic hypoxemic and hypercarbic respiratory failure with BiPAP use in outpatient.  She uses approximately 2 to 3 L of oxygen at home at baseline and chronic stable state has a lifelong smoking history as well as moderate obesity and obstructive sleep apnea overlap syndrome.  There is also a mention of pulmonary hypertension from previous transthoracic echo findings.  She came in to the emergency room with complaints of progressive shortness of breath over the last 3 to 4 days with cough which is dry without chest pain, she denied flulike illness denied nausea vomiting diarrhea but did report ongoing headache without acute focal neurologic deficits.  On initial emergency room evaluation she was noted to be in distress using accessory muscles of respiration and requiring extra oxygen up to 6 L to reach normoxia.  She had MRI brain performed with mild chronic small vessel ischemic disease chronically and no acute intracranial abnormalities noted.  She had chest x-ray performed with interstitial edema noted bilaterally.   PAST MEDICAL HISTORY   Past Medical History:  Diagnosis Date   Acute postoperative pain 10/03/2017   Anxiety    BiPAP (biphasic positive airway pressure) dependence    at hs   Carpal tunnel syndrome    CHF (congestive heart failure) ([place])    1/18   CHF (congestive heart failure) (H[facility])    Chronic generalized abdominal pain    Chronic rhinitis    COPD (chronic obstructive pulmonary disease) (HCC)    DDD (degenerative disc disease), cervical    DDD (degenerative disc  disease), lumbosacral    Depression    Dyspnea    Edema    Flu    1/18   Gastritis    GERD (gastroesophageal reflux disease)    Hematuria    Hernia of abdominal wall 07/17/2018   Hernia, abdominal    Hypertension    Kidney stones    Low back pain    Lumbar radiculitis    Malodorous urine    Muscle weakness    Obesity    Oxygen dependent    Pneumonia due to COVID-19 virus 02/03/2020   Renal cyst    Sensory urge incontinence    Thyroid activity decreased    9/19   Tobacco abuse    Wheezing      SURGICAL HISTORY   Past Surgical History:  Procedure Laterality Date   ABDOMINAL HYSTERECTOMY     CARPAL TUNNEL RELEASE Left 2012   EPIGASTRIC HERNIA REPAIR N/A 08/13/2018   HERNIA REPAIR EPIGASTRIC ADULT; polypropylene mesh reinforcement.  Surgeon: Robert Bellow, MD;  Location: [facility];  Service: General;  Laterality: N/A;   RIGHT/LEFT HEART CATH AND CORONARY ANGIOGRAPHY N/A 07/11/2019   Procedure: RIGHT/LEFT HEART CATH AND CORONARY ANGIOGRAPHY;  Surgeon: Yolonda Kida, MD;  Location: [place];  Service: Cardiovascular;  Laterality: N/A;   TONSILLECTOMY     TUBAL LIGATION     UMBILICAL HERNIA REPAIR N/A 08/13/2018   HERNIA REPAIR UMBILICAL ADULT;  polypropylene mesh reinforcement Surgeon: Robert Bellow, MD;  Location: [facility];  Service: General;  Laterality: N/A;     FAMILY HISTORY   Family History  Problem Relation Age of Onset   Heart disease Mother    Stroke Mother    Coronary artery disease Mother    Lung cancer Sister    Cancer Sister    Breast cancer Sister    Alcohol abuse Father    Heart disease Father    Cancer Brother    Cancer Brother    Pneumonia Brother    Prostate cancer Neg Hx    Kidney cancer Neg Hx    Bladder Cancer Neg Hx      SOCIAL HISTORY   Social History   Tobacco Use   Smoking status: Every Day    Packs/day: 0.50    Years: 44.00    Total pack years: 22.00    Types: Cigarettes   Smokeless tobacco: Never    Tobacco comments:    over a half pack  Vaping Use   Vaping Use: Never used  Substance Use Topics   Alcohol use: No   Drug use: Yes    Comment: prescribed hydrocodone     MEDICATIONS    Home Medication:    Current Medication:  Current Facility-Administered Medications:    acetaminophen (TYLENOL) tablet 650 mg, 650 mg, Oral, Q6H PRN, Ivor Costa, MD, 650 mg at 04/04/22 1935   albuterol (PROVENTIL) (2.5 MG/3ML) 0.083% nebulizer solution 2.5 mg, 2.5 mg, Nebulization, Q4H PRN, Ivor Costa, MD, 2.5 mg at 04/02/22 0054   ambrisentan (LETAIRIS) tablet 5 mg, 5 mg, Oral, Daily, Madueme, Elvira C, RPH, 5 mg at 04/05/22 0901   aspirin EC tablet 81 mg, 81 mg, Oral, Daily, Ivor Costa, MD, 81 mg at 04/05/22 0901   Besifloxacin HCl 0.6 % SUSP 1 drop, 1 drop, Right Eye, TID, Renda Rolls, RPH, 1 drop at 04/05/22 0903   Bromfenac Sodium 0.07 % SOLN 1 drop, 1 drop, Right Eye, QHS, Renda Rolls, RPH, 1 drop at 04/04/22 2138   cholecalciferol (VITAMIN D3) tablet 2,000 Units, 2,000 Units, Oral, Daily, Ivor Costa, MD, 2,000 Units at 04/05/22 0901   dextromethorphan-guaiFENesin ([place] DM) 30-600 MG per 12 hr tablet 1 tablet, 1 tablet, Oral, BID PRN, Ivor Costa, MD   [COMPLETED] Difluprednate 0.05 % EMUL 1 drop, 1 drop, Right Eye, QID, 1 drop at 04/04/22 2137 **FOLLOWED BY** Difluprednate 0.05 % EMUL 1 drop, 1 drop, Right Eye, TID, 1 drop at 04/05/22 0904 **FOLLOWED BY** [START ON 04/12/2022] Difluprednate 0.05 % EMUL 1 drop, 1 drop, Right Eye, BID **FOLLOWED BY** [START ON 04/19/2022] Difluprednate 0.05 % EMUL 1 drop, 1 drop, Right Eye, Daily, Belue, Nathan S, RPH   enoxaparin (LOVENOX) injection 40 mg, 0.5 mg/kg, Subcutaneous, Q24H, Ivor Costa, MD, 40 mg at 04/04/22 2139   fluticasone furoate-vilanterol (BREO ELLIPTA) 100-25 MCG/ACT 1 puff, 1 puff, Inhalation, Daily, 1 puff at 04/05/22 0905 **AND** umeclidinium bromide (INCRUSE ELLIPTA) 62.5 MCG/ACT 1 puff, 1 puff, Inhalation, Daily, Nicole Kindred A,  DO, 1 puff at 04/05/22 0905   furosemide (LASIX) injection 40 mg, 40 mg, Intravenous, BID, Nicole Kindred A, DO   hydrALAZINE (APRESOLINE) injection 5 mg, 5 mg, Intravenous, Q2H PRN, Ivor Costa, MD   HYDROcodone-acetaminophen (NORCO/VICODIN) 5-325 MG per tablet 1 tablet, 1 tablet, Oral, Q8H PRN, Ivor Costa, MD, 1 tablet at 04/05/22 0900   ipratropium-albuterol (DUONEB) 0.5-2.5 (3) MG/3ML nebulizer solution 3 mL, 3 mL, Nebulization, TID, Ezekiel Slocumb,  DO, 3 mL at 04/05/22 0748   ketorolac (TORADOL) 30 MG/ML injection 30 mg, 30 mg, Intravenous, Q6H PRN, Vira Blanco, RPH, 30 mg at 04/05/22 4008   levothyroxine (SYNTHROID) tablet 25 mcg, 25 mcg, Oral, Q0600, Ivor Costa, MD, 25 mcg at 04/05/22 0520   methylPREDNISolone sodium succinate (SOLU-MEDROL) 125 mg/2 mL injection 60 mg, 60 mg, Intravenous, Q12H, Nicole Kindred A, DO, 60 mg at 04/05/22 0901   nicotine (NICODERM CQ - dosed in mg/24 hours) patch 21 mg, 21 mg, Transdermal, Daily, Ivor Costa, MD, 21 mg at 04/05/22 0905   ondansetron (ZOFRAN) injection 4 mg, 4 mg, Intravenous, Q8H PRN, Ivor Costa, MD   pregabalin (LYRICA) capsule 150 mg, 150 mg, Oral, Daily, Nicole Kindred A, DO, 150 mg at 04/05/22 0900   roflumilast (DALIRESP) tablet 500 mcg, 500 mcg, Oral, Daily, Nicole Kindred A, DO, 500 mcg at 04/05/22 6761   sertraline (ZOLOFT) tablet 200 mg, 200 mg, Oral, q morning, Ivor Costa, MD, 200 mg at 04/05/22 0900   sodium chloride (OCEAN) 0.65 % nasal spray 1 spray, 1 spray, Each Nare, PRN, Nicole Kindred A, DO    ALLERGIES   Gabapentin, Codeine, and Meloxicam     REVIEW OF SYSTEMS    Review of Systems:  Gen:  Denies  fever, sweats, chills weigh loss  HEENT: Denies blurred vision, double vision, ear pain, eye pain, hearing loss, nose bleeds, sore throat Cardiac:  No dizziness, chest pain or heaviness, chest tightness,edema Resp:   reports dyspnea chronically  Gi: Denies swallowing difficulty, stomach pain, nausea or vomiting,  diarrhea, constipation, bowel incontinence Gu:  Denies bladder incontinence, burning urine Ext:   Denies Joint pain, stiffness or swelling Skin: Denies  skin rash, easy bruising or bleeding or hives Endoc:  Denies polyuria, polydipsia , polyphagia or weight change Psych:   Denies depression, insomnia or hallucinations   Other:  All other systems negative   VS: BP 133/83 (BP Location: Left Arm)   Pulse 82   Temp 98.6 F (37 C) (Oral)   Resp 20   Ht _0  (1.575 m)   Wt 95 kg   SpO2 97%   BMI 38.30 kg/m      PHYSICAL EXAM    GENERAL:NAD, no fevers, chills, no weakness no fatigue HEAD: Normocephalic, atraumatic.  EYES: Pupils equal, round, reactive to light. Extraocular muscles intact. No scleral icterus.  MOUTH: Moist mucosal membrane. Dentition intact. No abscess noted.  EAR, NOSE, THROAT: Clear without exudates. No external lesions.  NECK: Supple. No thyromegaly. No nodules. No JVD.  PULMONARY: decreased breath sounds with mild rhonchi worse at bases bilaterally.  CARDIOVASCULAR: S1 and S2. Regular rate and rhythm. No murmurs, rubs, or gallops. No edema. Pedal pulses 2+ bilaterally.  GASTROINTESTINAL: Soft, nontender, nondistended. No masses. Positive bowel sounds. No hepatosplenomegaly.  MUSCULOSKELETAL: No swelling, clubbing, or edema. Range of motion full in all extremities.  NEUROLOGIC: Cranial nerves II through XII are intact. No gross focal neurological deficits. Sensation intact. Reflexes intact.  SKIN: No ulceration, lesions, rashes, or cyanosis. Skin warm and dry. Turgor intact.  PSYCHIATRIC: Mood, affect within normal limits. The patient is awake, alert and oriented x 3. Insight, judgment intact.       IMAGING   CXR 04/05/22 with no pneumonia but + interstitial infiltrates bilaterally   ASSESSMENT/PLAN   -Acute on chronic hypoxemic hypercapnic respiratory failure        Due to acute exacerbation of COPD with CHF and pulmonary hypertension overlap     -  Reviewed chest imaging from this morning    -Patient is currently on Solu-Medrol 60 twice daily will reduce to 40 twice daily starting tomorrow   -I agree with DuoNebs every 8 hours as needed  -continue Daliresp 500 PO daily   -zithromax 250 po daily   -patient does not have IS but does have flutter valve  Acute interstitial pulmonary edema  Likely related to pulmonary HTN and CHF - have added spiranolactone 38m daily with one dose now - PT/OT   OSA with Pickwikian syndrome   Continue - BIPAP     Chronic pulmonary hypertension     C/w Ambrisentan 555mpo daily      Thank you for allowing me to participate in the care of this patient.   Patient/Family are satisfied with care plan and all questions have been answered.    Provider disclosure: Patient with at least one acute or chronic illness or injury that poses a threat to life or bodily function and is being managed actively during this encounter.  All of the below services have been performed independently by signing provider:  review of prior documentation from internal and or external health records.  Review of previous and current lab results.  Interview and comprehensive assessment during patient visit today. Review of current and previous chest radiographs/CT scans. Discussion of management and test interpretation with health care team and patient/family.   This document was prepared using Dragon voice recognition software and may include unintentional dictation errors.     FuOttie GlazierM.D.  Division of Pulmonary & Critical Care Medicine

## 2022-04-05 NOTE — Care Management Important Message (Signed)
Important Message  Patient Details  Name: Theresa [name] MRN: [id number] Date of Birth: 1956/02/28   Medicare Important Message Given:  Yes     Juliann Pulse A Reese Senk 04/05/2022, 12:22 PM

## 2022-04-06 ENCOUNTER — Inpatient Hospital Stay: Payer: Medicare HMO

## 2022-04-06 DIAGNOSIS — J9621 Acute and chronic respiratory failure with hypoxia: Secondary | ICD-10-CM | POA: Diagnosis not present

## 2022-04-06 LAB — CULTURE, BLOOD (ROUTINE X 2)
Culture: NO GROWTH
Culture: NO GROWTH

## 2022-04-06 LAB — SEDIMENTATION RATE: Sed Rate: 17 mm/hr (ref 0–30)

## 2022-04-06 LAB — BASIC METABOLIC PANEL
Anion gap: 5 (ref 5–15)
BUN: 27 mg/dL — ABNORMAL HIGH (ref 8–23)
CO2: 34 mmol/L — ABNORMAL HIGH (ref 22–32)
Calcium: 9 mg/dL (ref 8.9–10.3)
Chloride: 102 mmol/L (ref 98–111)
Creatinine, Ser: 0.59 mg/dL (ref 0.44–1.00)
GFR, Estimated: >60 mL/min (ref >60)
Glucose, Bld: 130 mg/dL — ABNORMAL HIGH (ref 70–99)
Potassium: 4.5 mmol/L (ref 3.5–5.1)
Sodium: 141 mmol/L (ref 135–145)

## 2022-04-06 MED ORDER — METHYLPREDNISOLONE SODIUM SUCC 40 MG IJ SOLR
40.0000 mg | Freq: Two times a day (BID) | INTRAMUSCULAR | Status: DC
  Administered 2022-04-06 – 2022-04-08 (×4): 40 mg via INTRAVENOUS
  Filled 2022-04-06 (×4): qty 1

## 2022-04-06 MED ORDER — TORSEMIDE 20 MG PO TABS
60.0000 mg | ORAL_TABLET | Freq: Two times a day (BID) | ORAL | Status: DC
  Administered 2022-04-06 – 2022-04-09 (×6): 60 mg via ORAL
  Filled 2022-04-06 (×6): qty 3

## 2022-04-06 NOTE — Progress Notes (Addendum)
PROGRESS NOTE    Theresa [name]  OJJ:[id number] DOB: 1955/11/21  DOA: 04/01/2022 Date of Service: 04/06/22 PCP: Birdie Sons, MD     Brief Narrative / Hospital Course:   66 y.o. female with medical history significant of dCHF, hypertension, COPD on 2.5 L oxygen, GERD, hypothyroidism, depression with anxiety, tobacco abuse, obesity with BMI 32.92, OSA with BiPAP dependency, chronic pain syndrome, pulmonary hypertension, who presented on 04/01/2022 for evaluation of severe persistent occipital headache with associated scalp tenderness x3 days (had cataract surgery .4 days prior to ED visit), also with with wheezing and shortness of breath worsening over a few days. Admitted to the hospital and started on IV steroids for acute exacerbation COPD, IV diuresis given signs of CHF decompensation.  Started on trial of Lyrica for headache presumed due to occipital neuralgia.  Pulmonary following, patient continuing to require O2 via [place] at 4 L, her baseline at home is 2 to 2.5 L.  Hospitalist had reached out to patient's pain management clinic but they are unable to perform nerve block here. Lyrica increased, Fioricet prn, obtain C-spine imaging as well   Consultants:  Pulmonology  Procedures: None    Subjective: Patient reports headache persists, heat to the neck seems to be helping. Reprots some mild tingling in her fingers, no weakness      ASSESSMENT & PLAN:   Principal Problem:   Acute on chronic respiratory failure with hypoxia (HCC) Active Problems:   COPD exacerbation (HCC)   Headache   Acute on chronic diastolic CHF (congestive heart failure) (HCC)   Essential (primary) hypertension   Hypothyroidism   Depression with anxiety   Tobacco abuse   Sleep apnea   BiPAP (biphasic positive airway pressure) dependence   Chronic pain   Pulmonary hypertension (HCC)   Obesity (BMI 30-39.9)   Acute on chronic respiratory failure with hypoxia (HCC) Acute on chronic respiratory  failure with hypoxia due to combination of CHF and COPD exacerbation.  In addition to signs of CHF decompensation, patient also presented with significant wheezing and poor aeration on auscultation, indicating concurrent COPD with acute exacerbation.  At baseline, patient uses 2.5-3 L/min supplemental O2 due to pulmonary hypertension and COPD.  Patient reports recently completing a long course of steroids. On admission, was requiring up to 6 L/min with O2 sat 80% on 3 L/min O2 and progressive worsening shortness of breath. 6/17: Diffuse expiratory high-pitched wheezes, poor air movement on 4 L/min O2 6/19: Still on 4-5 L/min, wheezing improved Pulmonology Dr. Raul Del, consulted Continue IV Solu-Medrol 60 mg twice daily Continue scheduled DuoNebs As needed albuterol nebs P.o. Zithromax Continue home Trelegy, Daliresp, Letairis BiPAP overnight / PRN Supplemental O2 to maintain sats 88 to 93%  COPD exacerbation (HCC) Presented with tachycardia and tachypnea, expiratory wheezes and poor air movement on exam.  Unlikely infectious etiology given no fever or leukocytosis, lactic acid normal, no focal infiltrates on CXR.  Sepsis was ruled out. Management as outlined for acute on chronic respiratory failure  Acute on chronic diastolic CHF (congestive heart failure) ([place]) Echo this admission with EF 55 to 93%, grade 1 diastolic dysfunction. Echo on 08/08/2021 showed EF 60-65%.  Presented with shortness of breath, pulmonary edema on chest x-ray, JVD, bibasilar crackles and lower extremity edema. Net IO Since Admission: -12,410 mL [04/06/22 1439] 6/18: 2900 cc output.  Appears euvolemic.  Transitioned back to torsemide 6/20: still needs 4-5 L O2 above baseline, wt is up.  BNP up and CXR with edema  Resume Lasix 40  mg twice daily Hold home torsemide this evening (takes 80 mg in the morning, 40 in the evening) Strict I/O's and daily weights Low sodium diet Monitor renal function and electrolytes Follows  with Dr. 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  No fevers.  CT head nonacute.  No temporal tenderness.  Has tenderness on palpation over occipital nerves bilaterally, R >L.  ESR minimally elevated 33.  CRP normal 0.9, less likely inflammatory etiology Started on very low-dose Lyrica 25 mg on admission without any improvement (patient declined gabapentin due to profound side effects including frequent falls went on in the past, weaned herself off).  So far no side effects with Lyrica, patient agreeable to uptitrating since not yet effective 6/20 - MRI brain negative, Increase Lyrica to 150 mg daily >>BID Also on IV steroids for COPD exacerbation Previous hospitalist had discussed with neurology:  Occipital nerve block recommended at pain clinic in close follow up if persistent (unable to get this done inpatient) Ice packs to posterior head Fioricet PRN C-spine imaging w/ Xray, may consider MRI  Pulmonary hypertension ([place]) Continue Letairis. Follows with Dr. Raul Del, consulted.  Obesity (BMI 30-39.9) Body mass index is 37.06 kg/m. Complicates overall care and prognosis.   Recommend lifestyle modifications including physical activity and diet for weight loss and overall long-term health.     DVT prophylaxis: lovenox Code Status: FULL Family Communication: none at this eime Disposition Plan / TOC needs: home when ready Barriers to  discharge / significant pending items: decreased O2 requirement, can hopefully go home tomorrow or following day              Objective: Vitals:   04/06/22 0356 04/06/22 0749 04/06/22 0808 04/06/22 1137  BP: 104/73  134/75 119/70  Pulse: 84  80 87  Resp: _0 Temp: 98.4 F (36.9 C)  97.7 F (36.5 C) 98.3 F (36.8 C)  TempSrc: Oral   Oral  SpO2: 93% 95% 94% 93%  Weight:      Height:        Intake/Output Summary (Last 24 hours) at 04/06/2022 1442 Last data filed at 04/06/2022 1300 Gross per 24 hour  Intake 720 ml  Output 4400 ml  Net -3680 ml   Filed Weights   04/03/22 0535 04/05/22 0410 04/06/22 0315  Weight: 92.1 kg 95 kg 95.4 kg    Examination:  Constitutional:  VS as above General Appearance: alert, well-developed, well-nourished, NAD Ears, Nose, Mouth, Throat: Normal appearance Neck: No masses, trachea midline Respiratory: Normal respiratory effort Breath sounds diffuse wheeze Cardiovascular: S1/S2 normal, RRR Trace lower extremity edema Gastrointestinal: Nontender, no masses Musculoskeletal:  No clubbing/cyanosis of digits Neurological: No cranial nerve deficit on limited exam Psychiatric: Normal judgment/insight Normal mood and affect       Scheduled Medications:   ambrisentan  5 mg Oral Daily   aspirin EC  81 mg Oral Daily   azithromycin  250 mg Oral Daily   Besifloxacin HCl  1 drop Right Eye TID   Bromfenac Sodium  1  drop Right Eye QHS   cholecalciferol  2,000 Units Oral Daily   Difluprednate  1 drop Right Eye TID   Followed by   Derrill Memo ON 04/12/2022] Difluprednate  1 drop Right Eye BID   Followed by   Derrill Memo ON 04/19/2022] Difluprednate  1 drop Right Eye Daily   enoxaparin (LOVENOX) injection  0.5 mg/kg Subcutaneous Q24H   fluticasone furoate-vilanterol  1 puff Inhalation Daily   And   umeclidinium bromide  1 puff Inhalation Daily   ipratropium-albuterol  3 mL Nebulization TID   levothyroxine  25 mcg Oral Q0600    methylPREDNISolone (SOLU-MEDROL) injection  60 mg Intravenous Q12H   nicotine  21 mg Transdermal Daily   pregabalin  150 mg Oral BID   roflumilast  500 mcg Oral Daily   sertraline  200 mg Oral q morning   spironolactone  50 mg Oral Daily   torsemide  60 mg Oral BID    Continuous Infusions:   PRN Medications:  acetaminophen, albuterol, butalbital-acetaminophen-caffeine, dextromethorphan-guaiFENesin, hydrALAZINE, HYDROcodone-acetaminophen, ondansetron (ZOFRAN) IV, mouth rinse, sodium chloride  Antimicrobials:  Anti-infectives (From admission, onward)    Start     Dose/Rate Route Frequency Ordered Stop   04/06/22 1000  azithromycin (ZITHROMAX) tablet 250 mg        250 mg Oral Daily 04/05/22 1052     04/02/22 1000  azithromycin (ZITHROMAX) tablet 250 mg  Status:  Discontinued       See Hyperspace for full Linked Orders Report.   250 mg Oral Daily 04/01/22 1039 04/01/22 1044   04/02/22 1000  azithromycin (ZITHROMAX) tablet 250 mg        250 mg Oral Daily 04/01/22 1044 04/05/22 0901   04/01/22 1045  azithromycin (ZITHROMAX) tablet 500 mg  Status:  Discontinued       See Hyperspace for full Linked Orders Report.   500 mg Oral Daily 04/01/22 1039 04/01/22 1044   04/01/22 0815  cefTRIAXone (ROCEPHIN) 2 g in sodium chloride 0.9 % 100 mL IVPB  Status:  Discontinued        2 g 200 mL/hr over 30 Minutes Intravenous Every 24 hours 04/01/22 0803 04/01/22 1039   04/01/22 0815  azithromycin (ZITHROMAX) 500 mg in sodium chloride 0.9 % 250 mL IVPB  Status:  Discontinued        500 mg 250 mL/hr over 60 Minutes Intravenous Every 24 hours 04/01/22 0803 04/01/22 1039       Data Reviewed: I have personally reviewed following labs and imaging studies  CBC: Recent Labs  Lab 04/01/22 0803 04/04/22 0518  WBC 7.7 8.3  NEUTROABS 6.1  --   HGB 14.2 13.0  HCT 46.1* 42.4  MCV 99.6 102.7*  PLT 162 921   Basic Metabolic Panel: Recent Labs  Lab 04/01/22 0803 04/02/22 0419 04/04/22 0518  04/06/22 0547  NA 139 144 144 141  K 3.9 3.7 4.9 4.5  CL 103 108 108 102  CO2 30 32 32 34*  GLUCOSE 106* 136* 137* 130*  BUN 16 25* 31* 27*  CREATININE 0.54 0.61 0.64 0.59  CALCIUM 9.3 9.2 9.2 9.0  MG  --  2.6*  --   --    GFR: Estimated Creatinine Clearance: 76.5 mL/min (by C-G formula based on SCr of 0.59 mg/dL). Liver Function Tests: Recent Labs  Lab 04/01/22 0803  AST 20  ALT 21  ALKPHOS 74  BILITOT 0.4  PROT 7.2  ALBUMIN 3.7   No results for input(s): "LIPASE", "AMYLASE" in the last 168  hours. No results for input(s): "AMMONIA" in the last 168 hours. Coagulation Profile: Recent Labs  Lab 04/01/22 0803  INR 0.9   Cardiac Enzymes: No results for input(s): "CKTOTAL", "CKMB", "CKMBINDEX", "TROPONINI" in the last 168 hours. BNP (last 3 results) No results for input(s): "PROBNP" in the last 8760 hours. HbA1C: No results for input(s): "HGBA1C" in the last 72 hours. CBG: No results for input(s): "GLUCAP" in the last 168 hours. Lipid Profile: No results for input(s): "CHOL", "HDL", "LDLCALC", "TRIG", "CHOLHDL", "LDLDIRECT" in the last 72 hours. Thyroid Function Tests: No results for input(s): "TSH", "T4TOTAL", "FREET4", "T3FREE", "THYROIDAB" in the last 72 hours. Anemia Panel: No results for input(s): "VITAMINB12", "FOLATE", "FERRITIN", "TIBC", "IRON", "RETICCTPCT" in the last 72 hours. Urine analysis:    Component Value Date/Time   COLORURINE STRAW (A) 04/01/2022 0803   APPEARANCEUR CLEAR (A) 04/01/2022 0803   APPEARANCEUR Cloudy (A) 11/28/2016 1128   LABSPEC 1.006 04/01/2022 0803   LABSPEC 1.011 06/12/2014 2003   PHURINE 6.0 04/01/2022 0803   GLUCOSEU NEGATIVE 04/01/2022 0803   GLUCOSEU Negative 06/12/2014 2003   HGBUR SMALL (A) 04/01/2022 0803   BILIRUBINUR NEGATIVE 04/01/2022 0803   BILIRUBINUR Negative 10/30/2019 1516   BILIRUBINUR Negative 11/28/2016 1128   BILIRUBINUR Negative 06/12/2014 2003   KETONESUR NEGATIVE 04/01/2022 0803   PROTEINUR NEGATIVE  04/01/2022 0803   UROBILINOGEN 0.2 10/30/2019 1516   UROBILINOGEN 0.2 07/30/2013 1305   NITRITE NEGATIVE 04/01/2022 0803   LEUKOCYTESUR NEGATIVE 04/01/2022 0803   LEUKOCYTESUR Negative 06/12/2014 2003   Sepsis Labs: _0 (procalcitonin:4,lacticidven:4)  Recent Results (from the past 240 hour(s))  Resp Panel by RT-PCR (Flu A&B, Covid) Anterior Nasal Swab     Status: None   Collection Time: 04/01/22  8:03 AM   Specimen: Anterior Nasal Swab  Result Value Ref Range Status   SARS Coronavirus 2 by RT PCR NEGATIVE NEGATIVE Final    Comment: (NOTE) SARS-CoV-2 target nucleic acids are NOT DETECTED.  The SARS-CoV-2 RNA is generally detectable in upper respiratory specimens during the acute phase of infection. The lowest concentration of SARS-CoV-2 viral copies this assay can detect is 138 copies/mL. A negative result does not preclude SARS-Cov-2 infection and should not be used as the sole basis for treatment or other patient management decisions. A negative result may occur with  improper specimen collection/handling, submission of specimen other than nasopharyngeal swab, presence of viral mutation(s) within the areas targeted by this assay, and inadequate number of viral copies(<138 copies/mL). A negative result must be combined with clinical observations, patient history, and epidemiological information. The expected result is Negative.  Fact Sheet for Patients:  EntrepreneurPulse.com.au  Fact Sheet for Healthcare Providers:  IncredibleEmployment.be  This test is no t yet approved or cleared by the [place] FDA and  has been authorized for detection and/or diagnosis of SARS-CoV-2 by FDA under an Emergency Use Authorization (EUA). This EUA will remain  in effect (meaning this test can be used) for the duration of the COVID-19 declaration under Section 564(b)(1) of the Act, 21 U.S.C.section 360bbb-3(b)(1), unless the authorization is  terminated  or revoked sooner.       Influenza A by PCR NEGATIVE NEGATIVE Final   Influenza B by PCR NEGATIVE NEGATIVE Final    Comment: (NOTE) The Xpert Xpress SARS-CoV-2/FLU/RSV plus assay is intended as an aid in the diagnosis of influenza from Nasopharyngeal swab specimens and should not be used as a sole basis for treatment. Nasal washings and aspirates are unacceptable for Xpert Xpress SARS-CoV-2/FLU/RSV testing.  Fact  Sheet for Patients: EntrepreneurPulse.com.au  Fact Sheet for Healthcare Providers: IncredibleEmployment.be  This test is not yet approved or cleared by the [place] FDA and has been authorized for detection and/or diagnosis of SARS-CoV-2 by FDA under an Emergency Use Authorization (EUA). This EUA will remain in effect (meaning this test can be used) for the duration of the COVID-19 declaration under Section 564(b)(1) of the Act, 21 U.S.C. section 360bbb-3(b)(1), unless the authorization is terminated or revoked.  Performed at [facility], [address] [place], [place] [address]   Blood Culture (routine x 2)     Status: None   Collection Time: 04/01/22  8:03 AM   Specimen: BLOOD  Result Value Ref Range Status   Specimen Description BLOOD RIGHT ANTECUBITAL  Final   Special Requests   Final    BOTTLES DRAWN AEROBIC AND ANAEROBIC Blood Culture results may not be optimal due to an inadequate volume of blood received in culture bottles   Culture   Final    NO GROWTH 5 DAYS Performed at [facility], [address] [place], [place] [address]    Report Status 04/06/2022 FINAL  Final  Urine Culture     Status: None   Collection Time: 04/01/22  8:03 AM   Specimen: Urine, Clean Catch  Result Value Ref Range Status   Specimen Description   Final    URINE, CLEAN CATCH Performed at [facility], [address], [place], [place] [address]    Special Requests   Final    NONE Performed  at [facility], [address] [place], [place] [address]    Culture   Final    NO GROWTH Performed at [facility], [place] [address], [place], [place] [address]    Report Status 04/02/2022 FINAL  Final  Blood Culture (routine x 2)     Status: None   Collection Time: 04/01/22  8:08 AM   Specimen: BLOOD  Result Value Ref Range Status   Specimen Description BLOOD LEFT ANTECUBITAL  Final   Special Requests   Final    BOTTLES DRAWN AEROBIC AND ANAEROBIC Blood Culture results may not be optimal due to an inadequate volume of blood received in culture bottles   Culture   Final    NO GROWTH 5 DAYS Performed at [facility], [address] [place], [place] [address]    Report Status 04/06/2022 FINAL  Final  Respiratory (~20 pathogens) panel by PCR     Status: None   Collection Time: 04/01/22 10:41 AM   Specimen: Nasopharyngeal Swab; Respiratory  Result Value Ref Range Status   Adenovirus NOT DETECTED NOT DETECTED Final   Coronavirus 229E NOT DETECTED NOT DETECTED Final    Comment: (NOTE) The Coronavirus on the Respiratory Panel, DOES NOT test for the novel  Coronavirus (2019 nCoV)    Coronavirus HKU1 NOT DETECTED NOT DETECTED Final   Coronavirus NL63 NOT DETECTED NOT DETECTED Final   Coronavirus OC43 NOT DETECTED NOT DETECTED Final   Metapneumovirus NOT DETECTED NOT DETECTED Final   Rhinovirus / Enterovirus NOT DETECTED NOT DETECTED Final   Influenza A NOT DETECTED NOT DETECTED Final   Influenza B NOT DETECTED NOT DETECTED Final   Parainfluenza Virus 1 NOT DETECTED NOT DETECTED Final   Parainfluenza Virus 2 NOT DETECTED NOT DETECTED Final   Parainfluenza Virus 3 NOT DETECTED NOT DETECTED Final   Parainfluenza Virus 4 NOT DETECTED NOT DETECTED Final   Respiratory Syncytial Virus NOT DETECTED NOT  DETECTED Final   Bordetella pertussis NOT DETECTED NOT DETECTED Final   Bordetella Parapertussis NOT DETECTED NOT DETECTED Final   Chlamydophila pneumoniae NOT  DETECTED NOT DETECTED Final   Mycoplasma pneumoniae NOT DETECTED NOT DETECTED Final    Comment: Performed at [facility], [place] [address] [place], [place] [address]         Radiology Studies last 96 hours: DG Chest Port 1 View  Result Date: 04/05/2022 CLINICAL DATA:  Shortness of breath, hypoxia EXAM: PORTABLE CHEST 1 VIEW COMPARISON:  04/01/2022 FINDINGS: No significant change in AP portable examination. Cardiomegaly with mild, diffuse interstitial opacity and pulmonary vascular prominence. The visualized skeletal structures are unremarkable. IMPRESSION: No significant change in AP portable examination. Cardiomegaly with mild, diffuse interstitial opacity and pulmonary vascular prominence, findings most likely reflect edema. No new or focal airspace opacity. Electronically Signed   By: Delanna Ahmadi M.D.   On: 04/05/2022 09:32   MR BRAIN WO CONTRAST  Result Date: 04/04/2022 CLINICAL DATA:  Occipital region headache and scalp tenderness. EXAM: MRI HEAD WITHOUT CONTRAST TECHNIQUE: Multiplanar, multiecho pulse sequences of the brain and surrounding structures were obtained without intravenous contrast. COMPARISON:  Head CT 04/01/2022. Head MRI 10/11/2019 and 12/10/2017. FINDINGS: Brain: There is no evidence of an acute infarct, intracranial hemorrhage, mass, midline shift, or extra-axial fluid collection. Patchy T2 hyperintensities in the cerebral white matter, basal ganglia, thalami, and brainstem are stable to mildly increased compared to the prior MRI and are nonspecific but compatible with moderate chronic small vessel ischemic disease. The ventricles and sulci are within normal limits for age. Vascular: Major intracranial vascular flow voids are preserved. Skull and upper cervical spine: Diffuse bone marrow heterogeneity including increased trace diffusion weighted signal intensity, chronic based on the 2019 MRI and nonspecific. No discrete focal lesion. Sinuses/Orbits: Bilateral cataract  extraction. Paranasal sinuses and mastoid air cells are clear. Other: None. IMPRESSION: 1. No acute intracranial abnormality. 2. Moderate chronic small vessel ischemic disease. Electronically Signed   By: Logan Bores M.D.   On: 04/04/2022 14:30            LOS: 5 days     Emeterio Reeve, DO [facility] 04/06/2022, 2:42 PM   Staff may message me via secure chat in [place]  but this may not receive immediate response,  please page for urgent matters!  If 7PM-7AM, please contact night-coverage [email or web address]  Dictation software was used to generate the above note. Typos may occur and escape review, as with typed/written notes. Please contact Dr Sheppard Coil directly for clarity if needed.

## 2022-04-06 NOTE — Progress Notes (Signed)
Uneventful night, rested well throughout the night. Bipap on until 5:30 am. Fioricet given earlier in shift. O2 continues at 4 liter. SRP, RN

## 2022-04-06 NOTE — Progress Notes (Signed)
Pulmonary Medicine          Date: 04/06/2022,   MRN# [id number] Theresa [name] [birth year]       HISTORY OF PRESENT ILLNESS   Pulmonary wise pretty much back to baseline. She is c/o persistent headaches which brought in here. No visual cuts, no tempral pain or tenderness, the occipital are is tender/ no jaw claudication. Radiographic studies non contributory, b/p is ok. Following pulm system.     PAST MEDICAL HISTORY   Past Medical History:  Diagnosis Date   Acute postoperative pain 10/03/2017   Anxiety    BiPAP (biphasic positive airway pressure) dependence    at hs   Carpal tunnel syndrome    CHF (congestive heart failure) ([place])    1/18   CHF (congestive heart failure) ([facility])    Chronic generalized abdominal pain    Chronic rhinitis    COPD (chronic obstructive pulmonary disease) ([facility])    DDD (degenerative disc disease), cervical    DDD (degenerative disc disease), lumbosacral    Depression    Dyspnea    Edema    Flu    1/18   Gastritis    GERD (gastroesophageal reflux disease)    Hematuria    Hernia of abdominal wall 07/17/2018   Hernia, abdominal    Hypertension    Kidney stones    Low back pain    Lumbar radiculitis    Malodorous urine    Muscle weakness    Obesity    Oxygen dependent    Pneumonia due to COVID-19 virus 02/03/2020   Renal cyst    Sensory urge incontinence    Thyroid activity decreased    9/19   Tobacco abuse    Wheezing      SURGICAL HISTORY   Past Surgical History:  Procedure Laterality Date   ABDOMINAL HYSTERECTOMY     CARPAL TUNNEL RELEASE Left 2012   EPIGASTRIC HERNIA REPAIR N/A 08/13/2018   HERNIA REPAIR EPIGASTRIC ADULT; polypropylene mesh reinforcement.  Surgeon: Robert Bellow, MD;  Location: [facility];  Service: General;  Laterality: N/A;   RIGHT/LEFT HEART CATH AND CORONARY ANGIOGRAPHY N/A 07/11/2019   Procedure: RIGHT/LEFT HEART CATH AND CORONARY ANGIOGRAPHY;  Surgeon: Yolonda Kida, MD;  Location: [facility];  Service: Cardiovascular;  Laterality: N/A;   TONSILLECTOMY     TUBAL LIGATION     UMBILICAL HERNIA REPAIR N/A 08/13/2018   HERNIA REPAIR UMBILICAL ADULT; polypropylene mesh reinforcement Surgeon: Robert Bellow, MD;  Location: [facility];  Service: General;  Laterality: N/A;     FAMILY HISTORY   Family History  Problem Relation Age of Onset   Heart disease Mother    Stroke Mother    Coronary artery disease Mother    Lung cancer Sister    Cancer Sister    Breast cancer Sister    Alcohol abuse Father    Heart disease Father    Cancer Brother    Cancer Brother    Pneumonia Brother    Prostate cancer Neg Hx    Kidney cancer Neg Hx    Bladder Cancer Neg Hx      SOCIAL HISTORY   Social History   Tobacco Use   Smoking status: Every Day    Packs/day: 0.50    Years: 44.00    Total pack years: 22.00    Types: Cigarettes   Smokeless tobacco: Never   Tobacco comments:    over a half pack  Vaping Use  Vaping Use: Never used  Substance Use Topics   Alcohol use: No   Drug use: Yes    Comment: prescribed hydrocodone     MEDICATIONS    Home Medication:    Current Medication:  Current Facility-Administered Medications:    acetaminophen (TYLENOL) tablet 650 mg, 650 mg, Oral, Q6H PRN, Ivor Costa, MD, 650 mg at 04/04/22 1935   albuterol (PROVENTIL) (2.5 MG/3ML) 0.083% nebulizer solution 2.5 mg, 2.5 mg, Nebulization, Q4H PRN, Ivor Costa, MD, 2.5 mg at 04/02/22 0054   ambrisentan (LETAIRIS) tablet 5 mg, 5 mg, Oral, Daily, Madueme, Elvira C, RPH, 5 mg at 04/06/22 7371   aspirin EC tablet 81 mg, 81 mg, Oral, Daily, Ivor Costa, MD, 81 mg at 04/06/22 0904   azithromycin (ZITHROMAX) tablet 250 mg, 250 mg, Oral, Daily, Ottie Glazier, MD, 250 mg at 04/06/22 0904   Besifloxacin HCl 0.6 % SUSP 1 drop, 1 drop, Right Eye, TID, Renda Rolls, RPH, 1 drop at 04/06/22 0946   Bromfenac Sodium 0.07 % SOLN 1 drop, 1 drop, Right Eye, QHS, Renda Rolls, RPH, 1 drop  at 04/05/22 2056   butalbital-acetaminophen-caffeine (FIORICET) 50-325-40 MG per tablet 1 tablet, 1 tablet, Oral, Q6H PRN, Ezekiel Slocumb, DO, 1 tablet at 04/06/22 0626   cholecalciferol (VITAMIN D3) tablet 2,000 Units, 2,000 Units, Oral, Daily, Ivor Costa, MD, 2,000 Units at 04/06/22 0904   dextromethorphan-guaiFENesin (Haysi DM) 30-600 MG per 12 hr tablet 1 tablet, 1 tablet, Oral, BID PRN, Ivor Costa, MD, 1 tablet at 04/05/22 2106   [COMPLETED] Difluprednate 0.05 % EMUL 1 drop, 1 drop, Right Eye, QID, 1 drop at 04/04/22 2137 **FOLLOWED BY** Difluprednate 0.05 % EMUL 1 drop, 1 drop, Right Eye, TID, 1 drop at 04/06/22 0946 **FOLLOWED BY** [START ON 04/12/2022] Difluprednate 0.05 % EMUL 1 drop, 1 drop, Right Eye, BID **FOLLOWED BY** [START ON 04/19/2022] Difluprednate 0.05 % EMUL 1 drop, 1 drop, Right Eye, Daily, Belue, Nathan S, RPH   enoxaparin (LOVENOX) injection 40 mg, 0.5 mg/kg, Subcutaneous, Q24H, Niu, Xilin, MD, 40 mg at 04/05/22 2110   fluticasone furoate-vilanterol (BREO ELLIPTA) 100-25 MCG/ACT 1 puff, 1 puff, Inhalation, Daily, 1 puff at 04/06/22 0905 **AND** umeclidinium bromide (INCRUSE ELLIPTA) 62.5 MCG/ACT 1 puff, 1 puff, Inhalation, Daily, Nicole Kindred A, DO, 1 puff at 04/06/22 0905   hydrALAZINE (APRESOLINE) injection 5 mg, 5 mg, Intravenous, Q2H PRN, Ivor Costa, MD   HYDROcodone-acetaminophen (NORCO/VICODIN) 5-325 MG per tablet 1 tablet, 1 tablet, Oral, Q8H PRN, Ivor Costa, MD, 1 tablet at 04/06/22 1542   ipratropium-albuterol (DUONEB) 0.5-2.5 (3) MG/3ML nebulizer solution 3 mL, 3 mL, Nebulization, TID, Nicole Kindred A, DO, 3 mL at 04/06/22 1414   levothyroxine (SYNTHROID) tablet 25 mcg, 25 mcg, Oral, Q0600, Ivor Costa, MD, 25 mcg at 04/06/22 0546   methylPREDNISolone sodium succinate (SOLU-MEDROL) 40 mg/mL injection 40 mg, 40 mg, Intravenous, Q12H, Alexander, Lanelle Bal, DO   nicotine (NICODERM CQ - dosed in mg/24 hours) patch 21 mg, 21 mg, Transdermal, Daily, Ivor Costa, MD, 21 mg  at 04/06/22 0903   ondansetron (ZOFRAN) injection 4 mg, 4 mg, Intravenous, Q8H PRN, Ivor Costa, MD   Oral care mouth rinse, 15 mL, Mouth Rinse, PRN, Nicole Kindred A, DO   pregabalin (LYRICA) capsule 150 mg, 150 mg, Oral, BID, Nicole Kindred A, DO, 150 mg at 04/06/22 9485   roflumilast (DALIRESP) tablet 500 mcg, 500 mcg, Oral, Daily, Nicole Kindred A, DO, 500 mcg at 04/06/22 4627   sertraline (ZOLOFT) tablet 200 mg, 200 mg,  Oral, q morning, Ivor Costa, MD, 200 mg at 04/06/22 5701   sodium chloride (OCEAN) 0.65 % nasal spray 1 spray, 1 spray, Each Nare, PRN, Nicole Kindred A, DO   spironolactone (ALDACTONE) tablet 50 mg, 50 mg, Oral, Daily, Ottie Glazier, MD, 50 mg at 04/06/22 7793   torsemide (DEMADEX) tablet 60 mg, 60 mg, Oral, BID, Emeterio Reeve, DO    ALLERGIES   Gabapentin, Codeine, and Meloxicam     REVIEW OF SYSTEMS    Review of Systems:  Gen:  Denies  fever, sweats, chills weigh loss  HEENT: Denies blurred vision, double vision, ear pain, eye pain, hearing loss, nose bleeds, sore throat Cardiac:  No dizziness, chest pain or heaviness, chest tightness,edema Resp:   Denies cough or sputum porduction, + chronic shortness of breath, no wheezing, hemoptysis,  Gi: Denies swallowing difficulty, stomach pain, nausea or vomiting, diarrhea, constipation, bowel incontinence Gu:  Denies bladder incontinence, burning urine Ext:   Denies Joint pain, stiffness or swelling Skin: Denies  skin rash, easy bruising or bleeding or hives Endoc:  Denies polyuria, polydipsia , polyphagia or weight change Psych:   Denies depression, insomnia or hallucinations   Other:  All other systems negative   VS: BP 118/66 (BP Location: Left Arm)   Pulse 100   Temp 98.2 F (36.8 C)   Resp 20   Ht _0  (1.575 m)   Wt 95.4 kg   SpO2 91%   BMI 38.46 kg/m      PHYSICAL EXAM    GENERAL:NAD, no fevers, chills, no weakness no fatigue, looks at her baseline pulmonary wise HEAD:  Normocephalic, atraumatic.  EYES: Pupils equal, round, reactive to light. Extraocular muscles intact. No scleral icterus.  MOUTH: Moist mucosal membrane. Dentition intact. No abscess noted.  EAR, NOSE, THROAT: Clear without exudates. No external lesions.  NECK: Supple. No thyromegaly. No nodules. No JVD.  PULMONARY: Diffuse coarse rhonchi right sided +wheezes CARDIOVASCULAR: S1 and S2. Regular rate and rhythm. No murmurs, rubs, or gallops. No edema. Pedal pulses 2+ bilaterally.  GASTROINTESTINAL: Soft, nontender, nondistended. No masses. Positive bowel sounds. No hepatosplenomegaly.  MUSCULOSKELETAL: No swelling, clubbing, or edema. Range of motion full in all extremities.  NEUROLOGIC: Cranial nerves II through XII are intact. No gross focal neurological deficits. Sensation intact. Reflexes intact.  SKIN: No ulceration, lesions, rashes, or cyanosis. Skin warm and dry. Turgor intact.  PSYCHIATRIC: Mood, affect within normal limits. The patient is awake, alert and oriented x 3. Insight, judgment intact.       IMAGING    DG Chest Port 1 View  Result Date: 04/05/2022 CLINICAL DATA:  Shortness of breath, hypoxia EXAM: PORTABLE CHEST 1 VIEW COMPARISON:  04/01/2022 FINDINGS: No significant change in AP portable examination. Cardiomegaly with mild, diffuse interstitial opacity and pulmonary vascular prominence. The visualized skeletal structures are unremarkable. IMPRESSION: No significant change in AP portable examination. Cardiomegaly with mild, diffuse interstitial opacity and pulmonary vascular prominence, findings most likely reflect edema. No new or focal airspace opacity. Electronically Signed   By: Delanna Ahmadi M.D.   On: 04/05/2022 09:32   MR BRAIN WO CONTRAST  Result Date: 04/04/2022 CLINICAL DATA:  Occipital region headache and scalp tenderness. EXAM: MRI HEAD WITHOUT CONTRAST TECHNIQUE: Multiplanar, multiecho pulse sequences of the brain and surrounding structures were obtained without  intravenous contrast. COMPARISON:  Head CT 04/01/2022. Head MRI 10/11/2019 and 12/10/2017. FINDINGS: Brain: There is no evidence of an acute infarct, intracranial hemorrhage, mass, midline shift, or extra-axial fluid collection. Patchy T2  hyperintensities in the cerebral white matter, basal ganglia, thalami, and brainstem are stable to mildly increased compared to the prior MRI and are nonspecific but compatible with moderate chronic small vessel ischemic disease. The ventricles and sulci are within normal limits for age. Vascular: Major intracranial vascular flow voids are preserved. Skull and upper cervical spine: Diffuse bone marrow heterogeneity including increased trace diffusion weighted signal intensity, chronic based on the 2019 MRI and nonspecific. No discrete focal lesion. Sinuses/Orbits: Bilateral cataract extraction. Paranasal sinuses and mastoid air cells are clear. Other: None. IMPRESSION: 1. No acute intracranial abnormality. 2. Moderate chronic small vessel ischemic disease. Electronically Signed   By: Logan Bores M.D.   On: 04/04/2022 14:30   ECHOCARDIOGRAM COMPLETE  Result Date: 04/02/2022    ECHOCARDIOGRAM REPORT   Patient Name:   [facility] Date of Exam: 04/02/2022 Medical Rec #:  [id number]      Height:       62.0 in Accession #:    [id number]     Weight:       202.6 lb Date of Birth:  July 21, 1956       BSA:          1.922 m Patient Age:    9 years       BP:           101/58 mmHg Patient Gender: F              HR:           93 bpm. Exam Location:  [facility] Procedure: 2D Echo Indications:     CHF I50.31  History:         Patient has prior history of Echocardiogram examinations, most                  recent 08/08/2021.  Sonographer:     Kathlen Brunswick RDCS Referring Phys:  [id number] Soledad Gerlach NIU Diagnosing Phys: Yolonda Kida MD IMPRESSIONS  1. Left ventricular ejection fraction, by estimation, is 55 to 60%. The left ventricle has normal function. The left ventricle has no regional wall motion  abnormalities. Left ventricular diastolic parameters are consistent with Grade I diastolic dysfunction (impaired relaxation).  2. Right ventricular systolic function is low normal. The right ventricular size is mildly enlarged.  3. Left atrial size was mildly dilated.  4. Right atrial size was mildly dilated.  5. The mitral valve is normal in structure. Trivial mitral valve regurgitation.  6. The aortic valve is grossly normal. Aortic valve regurgitation is not visualized. Conclusion(s)/Recommendation(s): Poor windows for evaluation of left ventricular function by transthoracic echocardiography. Would recommend an alternative means of evaluation. FINDINGS  Left Ventricle: Left ventricular ejection fraction, by estimation, is 55 to 60%. The left ventricle has normal function. The left ventricle has no regional wall motion abnormalities. The left ventricular internal cavity size was normal in size. There is  no left ventricular hypertrophy. Left ventricular diastolic parameters are consistent with Grade I diastolic dysfunction (impaired relaxation). Right Ventricle: The right ventricular size is mildly enlarged. No increase in right ventricular wall thickness. Right ventricular systolic function is low normal. Left Atrium: Left atrial size was mildly dilated. Right Atrium: Right atrial size was mildly dilated. Pericardium: There is no evidence of pericardial effusion. Mitral Valve: The mitral valve is normal in structure. Trivial mitral valve regurgitation. Tricuspid Valve: The tricuspid valve is normal in structure. Tricuspid valve regurgitation is trivial. Aortic Valve: The aortic valve is grossly normal. Aortic valve regurgitation is not visualized.  Aortic valve mean gradient measures 8.0 mmHg. Aortic valve peak gradient measures 15.1 mmHg. Aortic valve area, by VTI measures 2.58 cm. Pulmonic Valve: The pulmonic valve was normal in structure. Pulmonic valve regurgitation is not visualized. Aorta: The ascending  aorta was not well visualized. IAS/Shunts: No atrial level shunt detected by color flow Doppler.  LEFT VENTRICLE PLAX 2D LVIDd:         4.55 cm      Diastology LVIDs:         3.12 cm      LV e' medial:    9.90 cm/s LV PW:         1.10 cm      LV E/e' medial:  10.0 LV IVS:        0.85 cm      LV e' lateral:   12.30 cm/s LVOT diam:     2.10 cm      LV E/e' lateral: 8.1 LV SV:         95 LV SV Index:   49 LVOT Area:     3.46 cm  LV Volumes (MOD) LV vol d, MOD A4C: 105.0 ml LV vol s, MOD A4C: 39.0 ml LV SV MOD A4C:     105.0 ml RIGHT VENTRICLE RV Basal diam:  4.04 cm RV S prime:     14.40 cm/s TAPSE (M-mode): 2.8 cm LEFT ATRIUM             Index        RIGHT ATRIUM           Index LA diam:        4.30 cm 2.24 cm/m   RA Area:     29.70 cm LA Vol (A2C):   97.2 ml 50.57 ml/m  RA Volume:   110.00 ml 57.23 ml/m LA Vol (A4C):   45.2 ml 23.51 ml/m LA Biplane Vol: 66.4 ml 34.54 ml/m  AORTIC VALVE                     PULMONIC VALVE AV Area (Vmax):    2.50 cm      PV Vmax:       1.06 m/s AV Area (Vmean):   2.45 cm      PV Peak grad:  4.5 mmHg AV Area (VTI):     2.58 cm AV Vmax:           194.00 cm/s AV Vmean:          128.000 cm/s AV VTI:            0.367 m AV Peak Grad:      15.1 mmHg AV Mean Grad:      8.0 mmHg LVOT Vmax:         140.00 cm/s LVOT Vmean:        90.700 cm/s LVOT VTI:          0.273 m LVOT/AV VTI ratio: 0.74  AORTA Ao Root diam: 3.00 cm MITRAL VALVE                TRICUSPID VALVE MV Area (PHT): 6.27 cm     TV Peak grad:   43.3 mmHg MV Decel Time: 121 msec     TV Vmax:        3.29 m/s MV E velocity: 99.40 cm/s MV A velocity: 116.00 cm/s  SHUNTS MV E/A ratio:  0.86         Systemic VTI:  0.27 m  Systemic Diam: 2.10 cm Yolonda Kida MD Electronically signed by Yolonda Kida MD Signature Date/Time: 04/02/2022/3:26:17 PM    Final    CT HEAD WO CONTRAST (5MM)  Result Date: 04/01/2022 CLINICAL DATA:  66 year old female presents with new or worsening headache. EXAM: CT HEAD  WITHOUT CONTRAST TECHNIQUE: Contiguous axial images were obtained from the base of the skull through the vertex without intravenous contrast. RADIATION DOSE REDUCTION: This exam was performed according to the departmental dose-optimization program which includes automated exposure control, adjustment of the mA and/or kV according to patient size and/or use of iterative reconstruction technique. COMPARISON:  Comparison is made with October 11, 2019. FINDINGS: Brain: No evidence of acute infarction, hemorrhage, hydrocephalus, extra-axial collection or mass lesion/mass effect. Signs of mild atrophy and chronic microvascular ischemic change which is similar to previous imaging Vascular: No hyperdense vessel or unexpected calcification. Skull: Normal. Negative for fracture or focal lesion. Sinuses/Orbits: Visualized paranasal sinuses and orbits show no acute process to the extent evaluated. Other: None IMPRESSION: 1. No acute intracranial abnormality. 2. Signs of mild chronic microvascular ischemic change which is similar to previous imaging. Electronically Signed   By: Zetta Bills M.D.   On: 04/01/2022 14:07   DG Chest Port 1 View  Result Date: 04/01/2022 CLINICAL DATA:  Questionable sepsis.  Shortness of breath. EXAM: PORTABLE CHEST 1 VIEW COMPARISON:  Chest radiographs 08/06/2021 FINDINGS: The cardiac silhouette is upper limits of normal in size. There is unchanged enlargement of the pulmonary arteries. Aortic atherosclerosis is noted. The lungs are hyperinflated with chronic peribronchial thickening and underlying emphysema. The interstitial markings are slightly more prominent diffusely compared to the prior study. No focal airspace consolidation, sizeable pleural effusion, or pneumothorax is identified. No acute osseous abnormality is seen. IMPRESSION: COPD with superimposed mild edema or infection not excluded. Electronically Signed   By: Logan Bores M.D.   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 Etiology, temporal area not tender. No jaw claudication doubt TA, ? Neuromuscular.  - Sed rate  - w/u in progress       Thank you for allowing me to participate in the care of this patient.   Patient/Family are satisfied with care plan and all questions have been answered.  This document was prepared using Dragon voice recognition software and may include unintentional dictation errors.     HeWallene HuhM.D.  Division of [facility]

## 2022-04-07 DIAGNOSIS — J9621 Acute and chronic respiratory failure with hypoxia: Secondary | ICD-10-CM | POA: Diagnosis not present

## 2022-04-07 DIAGNOSIS — M47812 Spondylosis without myelopathy or radiculopathy, cervical region: Secondary | ICD-10-CM

## 2022-04-07 LAB — BASIC METABOLIC PANEL
Anion gap: 7 (ref 5–15)
BUN: 30 mg/dL — ABNORMAL HIGH (ref 8–23)
CO2: 33 mmol/L — ABNORMAL HIGH (ref 22–32)
Calcium: 8.9 mg/dL (ref 8.9–10.3)
Chloride: 101 mmol/L (ref 98–111)
Creatinine, Ser: 0.63 mg/dL (ref 0.44–1.00)
GFR, Estimated: >60 mL/min (ref >60)
Glucose, Bld: 123 mg/dL — ABNORMAL HIGH (ref 70–99)
Potassium: 4 mmol/L (ref 3.5–5.1)
Sodium: 141 mmol/L (ref 135–145)

## 2022-04-07 LAB — CBC
HCT: 40.3 % (ref 36.0–46.0)
Hemoglobin: 12.5 g/dL (ref 12.0–15.0)
MCH: 31.2 pg (ref 26.0–34.0)
MCHC: 31 g/dL (ref 30.0–36.0)
MCV: 100.5 fL — ABNORMAL HIGH (ref 80.0–100.0)
Platelets: 177 10*3/uL (ref 150–400)
RBC: 4.01 MIL/uL (ref 3.87–5.11)
RDW: 15.7 % — ABNORMAL HIGH (ref 11.5–15.5)
WBC: 9.6 10*3/uL (ref 4.0–10.5)
nRBC: 0 % (ref 0.0–0.2)

## 2022-04-07 MED ORDER — LIDOCAINE 5 % EX PTCH
1.0000 | MEDICATED_PATCH | CUTANEOUS | Status: DC
  Administered 2022-04-07: 1 via TRANSDERMAL
  Filled 2022-04-07 (×3): qty 2

## 2022-04-07 NOTE — Final Progress Note (Signed)
Pulmonary Medicine          Date: 04/07/2022,   MRN# [id number] Theresa [name] 1956/02/23      HISTORY OF PRESENT ILLNESS   She voices if she did not have the headaches she would be ok to go home. Her breathing is not an issue. Still on 4 liters ( base line 2.5 liters)   PAST MEDICAL HISTORY   Past Medical History:  Diagnosis Date   Acute postoperative pain 10/03/2017   Anxiety    BiPAP (biphasic positive airway pressure) dependence    at hs   Carpal tunnel syndrome    CHF (congestive heart failure) ([place])    1/18   CHF (congestive heart failure) ([facility])    Chronic generalized abdominal pain    Chronic rhinitis    COPD (chronic obstructive pulmonary disease) ([facility])    DDD (degenerative disc disease), cervical    DDD (degenerative disc disease), lumbosacral    Depression    Dyspnea    Edema    Flu    1/18   Gastritis    GERD (gastroesophageal reflux disease)    Hematuria    Hernia of abdominal wall 07/17/2018   Hernia, abdominal    Hypertension    Kidney stones    Low back pain    Lumbar radiculitis    Malodorous urine    Muscle weakness    Obesity    Oxygen dependent    Pneumonia due to COVID-19 virus 02/03/2020   Renal cyst    Sensory urge incontinence    Thyroid activity decreased    9/19   Tobacco abuse    Wheezing      SURGICAL HISTORY   Past Surgical History:  Procedure Laterality Date   ABDOMINAL HYSTERECTOMY     CARPAL TUNNEL RELEASE Left 2012   EPIGASTRIC HERNIA REPAIR N/A 08/13/2018   HERNIA REPAIR EPIGASTRIC ADULT; polypropylene mesh reinforcement.  Surgeon: Robert Bellow, MD;  Location: [facility];  Service: General;  Laterality: N/A;   RIGHT/LEFT HEART CATH AND CORONARY ANGIOGRAPHY N/A 07/11/2019   Procedure: RIGHT/LEFT HEART CATH AND CORONARY ANGIOGRAPHY;  Surgeon: Yolonda Kida, MD;  Location: [facility];  Service: Cardiovascular;  Laterality: N/A;   TONSILLECTOMY     TUBAL LIGATION     UMBILICAL HERNIA REPAIR  N/A 08/13/2018   HERNIA REPAIR UMBILICAL ADULT; polypropylene mesh reinforcement Surgeon: Robert Bellow, MD;  Location: [facility];  Service: General;  Laterality: N/A;     FAMILY HISTORY   Family History  Problem Relation Age of Onset   Heart disease Mother    Stroke Mother    Coronary artery disease Mother    Lung cancer Sister    Cancer Sister    Breast cancer Sister    Alcohol abuse Father    Heart disease Father    Cancer Brother    Cancer Brother    Pneumonia Brother    Prostate cancer Neg Hx    Kidney cancer Neg Hx    Bladder Cancer Neg Hx      SOCIAL HISTORY   Social History   Tobacco Use   Smoking status: Every Day    Packs/day: 0.50    Years: 44.00    Total pack years: 22.00    Types: Cigarettes   Smokeless tobacco: Never   Tobacco comments:    over a half pack  Vaping Use   Vaping Use: Never used  Substance Use Topics   Alcohol use: No  Drug use: Yes    Comment: prescribed hydrocodone     MEDICATIONS    Home Medication:    Current Medication:  Current Facility-Administered Medications:    acetaminophen (TYLENOL) tablet 650 mg, 650 mg, Oral, Q6H PRN, Ivor Costa, MD, 650 mg at 04/04/22 1935   albuterol (PROVENTIL) (2.5 MG/3ML) 0.083% nebulizer solution 2.5 mg, 2.5 mg, Nebulization, Q4H PRN, Ivor Costa, MD, 2.5 mg at 04/02/22 0054   ambrisentan (LETAIRIS) tablet 5 mg, 5 mg, Oral, Daily, Madueme, Elvira C, RPH, 5 mg at 04/07/22 2595   aspirin EC tablet 81 mg, 81 mg, Oral, Daily, Ivor Costa, MD, 81 mg at 04/07/22 0813   azithromycin (ZITHROMAX) tablet 250 mg, 250 mg, Oral, Daily, Ottie Glazier, MD, 250 mg at 04/07/22 6387   Besifloxacin HCl 0.6 % SUSP 1 drop, 1 drop, Right Eye, TID, Renda Rolls, RPH, 1 drop at 04/07/22 1606   Bromfenac Sodium 0.07 % SOLN 1 drop, 1 drop, Right Eye, QHS, Renda Rolls, RPH, 1 drop at 04/06/22 2134   butalbital-acetaminophen-caffeine (FIORICET) 50-325-40 MG per tablet 1 tablet, 1 tablet, Oral, Q6H PRN,  Ezekiel Slocumb, DO, 1 tablet at 04/07/22 0546   cholecalciferol (VITAMIN D3) tablet 2,000 Units, 2,000 Units, Oral, Daily, Ivor Costa, MD, 2,000 Units at 04/07/22 0814   dextromethorphan-guaiFENesin ([place] DM) 30-600 MG per 12 hr tablet 1 tablet, 1 tablet, Oral, BID PRN, Ivor Costa, MD, 1 tablet at 04/05/22 2106   [COMPLETED] Difluprednate 0.05 % EMUL 1 drop, 1 drop, Right Eye, QID, 1 drop at 04/04/22 2137 **FOLLOWED BY** Difluprednate 0.05 % EMUL 1 drop, 1 drop, Right Eye, TID, 1 drop at 04/07/22 1607 **FOLLOWED BY** [START ON 04/12/2022] Difluprednate 0.05 % EMUL 1 drop, 1 drop, Right Eye, BID **FOLLOWED BY** [START ON 04/19/2022] Difluprednate 0.05 % EMUL 1 drop, 1 drop, Right Eye, Daily, Belue, Nathan S, RPH   enoxaparin (LOVENOX) injection 40 mg, 0.5 mg/kg, Subcutaneous, Q24H, Niu, Xilin, MD, 40 mg at 04/06/22 2131   fluticasone furoate-vilanterol (BREO ELLIPTA) 100-25 MCG/ACT 1 puff, 1 puff, Inhalation, Daily, 1 puff at 04/07/22 0820 **AND** umeclidinium bromide (INCRUSE ELLIPTA) 62.5 MCG/ACT 1 puff, 1 puff, Inhalation, Daily, Nicole Kindred A, DO, 1 puff at 04/07/22 0818   hydrALAZINE (APRESOLINE) injection 5 mg, 5 mg, Intravenous, Q2H PRN, Ivor Costa, MD   HYDROcodone-acetaminophen (NORCO/VICODIN) 5-325 MG per tablet 1 tablet, 1 tablet, Oral, Q8H PRN, Ivor Costa, MD, 1 tablet at 04/07/22 1606   ipratropium-albuterol (DUONEB) 0.5-2.5 (3) MG/3ML nebulizer solution 3 mL, 3 mL, Nebulization, TID, Nicole Kindred A, DO, 3 mL at 04/07/22 1422   levothyroxine (SYNTHROID) tablet 25 mcg, 25 mcg, Oral, Q0600, Ivor Costa, MD, 25 mcg at 04/07/22 0546   lidocaine (LIDODERM) 5 % 1-2 patch, 1-2 patch, Transdermal, Q24H, Emeterio Reeve, DO, 1 patch at 04/07/22 1153   methylPREDNISolone sodium succinate (SOLU-MEDROL) 40 mg/mL injection 40 mg, 40 mg, Intravenous, Q12H, Emeterio Reeve, DO, 40 mg at 04/07/22 5643   nicotine (NICODERM CQ - dosed in mg/24 hours) patch 21 mg, 21 mg, Transdermal, Daily, Ivor Costa, MD, 21 mg at 04/07/22 0820   ondansetron (ZOFRAN) injection 4 mg, 4 mg, Intravenous, Q8H PRN, Ivor Costa, MD   Oral care mouth rinse, 15 mL, Mouth Rinse, PRN, Nicole Kindred A, DO   pregabalin (LYRICA) capsule 150 mg, 150 mg, Oral, BID, Nicole Kindred A, DO, 150 mg at 04/07/22 0813   roflumilast (DALIRESP) tablet 500 mcg, 500 mcg, Oral, Daily, Nicole Kindred A, DO, 500 mcg at 04/07/22  4098   sertraline (ZOLOFT) tablet 200 mg, 200 mg, Oral, q morning, Ivor Costa, MD, 200 mg at 04/07/22 0813   sodium chloride (OCEAN) 0.65 % nasal spray 1 spray, 1 spray, Each Nare, PRN, Nicole Kindred A, DO   spironolactone (ALDACTONE) tablet 50 mg, 50 mg, Oral, Daily, Ottie Glazier, MD, 50 mg at 04/07/22 0813   torsemide (DEMADEX) tablet 60 mg, 60 mg, Oral, BID, Emeterio Reeve, DO, 60 mg at 04/07/22 1606    ALLERGIES   Gabapentin, Codeine, and Meloxicam     REVIEW OF SYSTEMS    Review of Systems:  Gen:  Denies  fever, sweats, chills weigh loss  HEENT: Denies blurred vision, double vision, ear pain, eye pain, hearing loss, nose bleeds, sore throat Cardiac:  No dizziness, chest pain or heaviness, chest tightness,edema Resp:   Denies cough or sputum porduction, shortness of breath,wheezing, hemoptysis,  Gi: Denies swallowing difficulty, stomach pain, nausea or vomiting, diarrhea, constipation, bowel incontinence Gu:  Denies bladder incontinence, burning urine Ext:   Denies Joint pain, stiffness or swelling Skin: Denies  skin rash, easy bruising or bleeding or hives Endoc:  Denies polyuria, polydipsia , polyphagia or weight change Psych:   Denies depression, insomnia or hallucinations   Other:  All other systems negative   VS: BP 119/68 (BP Location: Right Arm)   Pulse (!) 102   Temp 98.3 F (36.8 C) (Oral)   Resp 19   Ht _0  (1.575 m)   Wt 92.6 kg   SpO2 90%   BMI 37.34 kg/m      PHYSICAL EXAM    GENERAL:NAD, no fevers, chills, no weakness no fatigue HEAD:  Normocephalic, atraumatic.  EYES: Pupils equal, round, reactive to light. Extraocular muscles intact. No scleral icterus.  MOUTH: Moist mucosal membrane. Dentition intact. No abscess noted.  EAR, NOSE, THROAT: Clear without exudates. No external lesions.  NECK: Supple. No thyromegaly. No nodules. No JVD.  PULMONARY: Diffuse coarse rhonchi right sided +wheezes CARDIOVASCULAR: S1 and S2. Regular rate and rhythm. No murmurs, rubs, or gallops. No edema. Pedal pulses 2+ bilaterally.  GASTROINTESTINAL: Soft, nontender, nondistended. No masses. Positive bowel sounds. No hepatosplenomegaly.  MUSCULOSKELETAL: No swelling, clubbing, or edema. Range of motion full in all extremities.  NEUROLOGIC: Cranial nerves II through XII are intact. No gross focal neurological deficits. Sensation intact. Reflexes intact.  SKIN: No ulceration, lesions, rashes, or cyanosis. Skin warm and dry. Turgor intact.  PSYCHIATRIC: Mood, affect within normal limits. The patient is awake, alert and oriented x 3. Insight, judgment intact.       IMAGING    MR CERVICAL SPINE WO CONTRAST  Result Date: 04/07/2022 CLINICAL DATA:  Cervical myelopathy EXAM: MRI CERVICAL SPINE WITHOUT CONTRAST TECHNIQUE: Multiplanar, multisequence MR imaging of the cervical spine was performed. No intravenous contrast was administered. COMPARISON:  12/25/2007 MRI cervical spine 10/11/2019 CT cervical spine FINDINGS: Evaluation is somewhat limited by motion artifact. Alignment: Straightening and mild reversal of the normal cervical lordosis. No listhesis. Vertebrae: No acute fracture or suspicious osseous lesion. T1 and T2 hyperintense focus in the C5 vertebral body, most likely a benign hemangioma. Vertebral body heights are preserved. Congenitally short pedicles, which narrow the AP diameter of the spinal canal. Cord: Normal signal and morphology. Previously noted prominence of the central canal at the level of C7 is no longer seen. Posterior Fossa,  vertebral arteries, paraspinal tissues: Negative. Disc levels: C2-C3: No significant disc bulge. No spinal canal stenosis or neural foraminal narrowing. C3-C4: Minimal disc bulge. Facet and  uncovertebral hypertrophy, left-greater-than-right. No spinal canal stenosis. Mild-to-moderate right and severe left neural foraminal narrowing, which have progressed from the prior MRI . C4-C5: No significant disc bulge. Facet and uncovertebral hypertrophy. Moderate left and mild right neural foraminal narrowing, which have progressed from the prior exam. C5-C6: Minimal disc bulge. Ligamentum flavum hypertrophy. Facet and uncovertebral hypertrophy. Mild spinal canal stenosis, with severe right and moderate to severe left neural foraminal narrowing, which have progressed from the prior exam. C6-C7: Disc height loss and minimal disc bulge. Facet and uncovertebral hypertrophy. Ligamentum flavum hypertrophy. Mild spinal canal stenosis overall unchanged. Mild-to-moderate right and severe left neural foraminal narrowing, which have progressed from prior exam. C7-T1: No significant disc bulge. No spinal canal stenosis or neural foraminal narrowing. IMPRESSION: 1. Evaluation is limited by motion artifact. Within this limitation, there progressive degenerative changes compared to 2009, with mild spinal canal stenosis C4-C5 and C5-C6. 2. Multilevel facet and uncovertebral hypertrophy, which causes up to severe neural foraminal narrowing, which is seen on the left at C3-C4 and C6-C7 and on the right at C5-C6. Additional less significant neural foraminal narrowing, as described above. Electronically Signed   By: Merilyn Baba M.D.   On: 04/07/2022 00:59   DG Chest Port 1 View  Result Date: 04/05/2022 CLINICAL DATA:  Shortness of breath, hypoxia EXAM: PORTABLE CHEST 1 VIEW COMPARISON:  04/01/2022 FINDINGS: No significant change in AP portable examination. Cardiomegaly with mild, diffuse interstitial opacity and pulmonary vascular  prominence. The visualized skeletal structures are unremarkable. IMPRESSION: No significant change in AP portable examination. Cardiomegaly with mild, diffuse interstitial opacity and pulmonary vascular prominence, findings most likely reflect edema. No new or focal airspace opacity. Electronically Signed   By: Delanna Ahmadi M.D.   On: 04/05/2022 09:32   MR BRAIN WO CONTRAST  Result Date: 04/04/2022 CLINICAL DATA:  Occipital region headache and scalp tenderness. EXAM: MRI HEAD WITHOUT CONTRAST TECHNIQUE: Multiplanar, multiecho pulse sequences of the brain and surrounding structures were obtained without intravenous contrast. COMPARISON:  Head CT 04/01/2022. Head MRI 10/11/2019 and 12/10/2017. FINDINGS: Brain: There is no evidence of an acute infarct, intracranial hemorrhage, mass, midline shift, or extra-axial fluid collection. Patchy T2 hyperintensities in the cerebral white matter, basal ganglia, thalami, and brainstem are stable to mildly increased compared to the prior MRI and are nonspecific but compatible with moderate chronic small vessel ischemic disease. The ventricles and sulci are within normal limits for age. Vascular: Major intracranial vascular flow voids are preserved. Skull and upper cervical spine: Diffuse bone marrow heterogeneity including increased trace diffusion weighted signal intensity, chronic based on the 2019 MRI and nonspecific. No discrete focal lesion. Sinuses/Orbits: Bilateral cataract extraction. Paranasal sinuses and mastoid air cells are clear. Other: None. IMPRESSION: 1. No acute intracranial abnormality. 2. Moderate chronic small vessel ischemic disease. Electronically Signed   By: Logan Bores M.D.   On: 04/04/2022 14:30   ECHOCARDIOGRAM COMPLETE  Result Date: 04/02/2022    ECHOCARDIOGRAM REPORT   Patient Name:   [facility] Date of Exam: 04/02/2022 Medical Rec #:  [id number]      Height:       62.0 in Accession #:    [id number]     Weight:       202.6 lb Date of Birth:   [birth year]       BSA:          1.922 m Patient Age:    46 years       BP:  101/58 mmHg Patient Gender: F              HR:           93 bpm. Exam Location:  [facility] Procedure: 2D Echo Indications:     CHF I50.31  History:         Patient has prior history of Echocardiogram examinations, most                  recent 08/08/2021.  Sonographer:     Kathlen Brunswick RDCS Referring Phys:  [id number] Soledad Gerlach NIU Diagnosing Phys: Yolonda Kida MD IMPRESSIONS  1. Left ventricular ejection fraction, by estimation, is 55 to 60%. The left ventricle has normal function. The left ventricle has no regional wall motion abnormalities. Left ventricular diastolic parameters are consistent with Grade I diastolic dysfunction (impaired relaxation).  2. Right ventricular systolic function is low normal. The right ventricular size is mildly enlarged.  3. Left atrial size was mildly dilated.  4. Right atrial size was mildly dilated.  5. The mitral valve is normal in structure. Trivial mitral valve regurgitation.  6. The aortic valve is grossly normal. Aortic valve regurgitation is not visualized. Conclusion(s)/Recommendation(s): Poor windows for evaluation of left ventricular function by transthoracic echocardiography. Would recommend an alternative means of evaluation. FINDINGS  Left Ventricle: Left ventricular ejection fraction, by estimation, is 55 to 60%. The left ventricle has normal function. The left ventricle has no regional wall motion abnormalities. The left ventricular internal cavity size was normal in size. There is  no left ventricular hypertrophy. Left ventricular diastolic parameters are consistent with Grade I diastolic dysfunction (impaired relaxation). Right Ventricle: The right ventricular size is mildly enlarged. No increase in right ventricular wall thickness. Right ventricular systolic function is low normal. Left Atrium: Left atrial size was mildly dilated. Right Atrium: Right atrial size was mildly dilated.  Pericardium: There is no evidence of pericardial effusion. Mitral Valve: The mitral valve is normal in structure. Trivial mitral valve regurgitation. Tricuspid Valve: The tricuspid valve is normal in structure. Tricuspid valve regurgitation is trivial. Aortic Valve: The aortic valve is grossly normal. Aortic valve regurgitation is not visualized. Aortic valve mean gradient measures 8.0 mmHg. Aortic valve peak gradient measures 15.1 mmHg. Aortic valve area, by VTI measures 2.58 cm. Pulmonic Valve: The pulmonic valve was normal in structure. Pulmonic valve regurgitation is not visualized. Aorta: The ascending aorta was not well visualized. IAS/Shunts: No atrial level shunt detected by color flow Doppler.  LEFT VENTRICLE PLAX 2D LVIDd:         4.55 cm      Diastology LVIDs:         3.12 cm      LV e' medial:    9.90 cm/s LV PW:         1.10 cm      LV E/e' medial:  10.0 LV IVS:        0.85 cm      LV e' lateral:   12.30 cm/s LVOT diam:     2.10 cm      LV E/e' lateral: 8.1 LV SV:         95 LV SV Index:   49 LVOT Area:     3.46 cm  LV Volumes (MOD) LV vol d, MOD A4C: 105.0 ml LV vol s, MOD A4C: 39.0 ml LV SV MOD A4C:     105.0 ml RIGHT VENTRICLE RV Basal diam:  4.04 cm RV S prime:  14.40 cm/s TAPSE (M-mode): 2.8 cm LEFT ATRIUM             Index        RIGHT ATRIUM           Index LA diam:        4.30 cm 2.24 cm/m   RA Area:     29.70 cm LA Vol (A2C):   97.2 ml 50.57 ml/m  RA Volume:   110.00 ml 57.23 ml/m LA Vol (A4C):   45.2 ml 23.51 ml/m LA Biplane Vol: 66.4 ml 34.54 ml/m  AORTIC VALVE                     PULMONIC VALVE AV Area (Vmax):    2.50 cm      PV Vmax:       1.06 m/s AV Area (Vmean):   2.45 cm      PV Peak grad:  4.5 mmHg AV Area (VTI):     2.58 cm AV Vmax:           194.00 cm/s AV Vmean:          128.000 cm/s AV VTI:            0.367 m AV Peak Grad:      15.1 mmHg AV Mean Grad:      8.0 mmHg LVOT Vmax:         140.00 cm/s LVOT Vmean:        90.700 cm/s LVOT VTI:          0.273 m LVOT/AV VTI  ratio: 0.74  AORTA Ao Root diam: 3.00 cm MITRAL VALVE                TRICUSPID VALVE MV Area (PHT): 6.27 cm     TV Peak grad:   43.3 mmHg MV Decel Time: 121 msec     TV Vmax:        3.29 m/s MV E velocity: 99.40 cm/s MV A velocity: 116.00 cm/s  SHUNTS MV E/A ratio:  0.86         Systemic VTI:  0.27 m                             Systemic Diam: 2.10 cm Dwayne Prince Rome MD Electronically signed by Yolonda Kida MD Signature Date/Time: 04/02/2022/3:26:17 PM    Final    CT HEAD WO CONTRAST (5MM)  Result Date: 04/01/2022 CLINICAL DATA:  67 year old female presents with new or worsening headache. EXAM: CT HEAD WITHOUT CONTRAST TECHNIQUE: Contiguous axial images were obtained from the base of the skull through the vertex without intravenous contrast. RADIATION DOSE REDUCTION: This exam was performed according to the departmental dose-optimization program which includes automated exposure control, adjustment of the mA and/or kV according to patient size and/or use of iterative reconstruction technique. COMPARISON:  Comparison is made with October 11, 2019. FINDINGS: Brain: No evidence of acute infarction, hemorrhage, hydrocephalus, extra-axial collection or mass lesion/mass effect. Signs of mild atrophy and chronic microvascular ischemic change which is similar to previous imaging Vascular: No hyperdense vessel or unexpected calcification. Skull: Normal. Negative for fracture or focal lesion. Sinuses/Orbits: Visualized paranasal sinuses and orbits show no acute process to the extent evaluated. Other: None IMPRESSION: 1. No acute intracranial abnormality. 2. Signs of mild chronic microvascular ischemic change which is similar to previous imaging. Electronically Signed   By: Jewel Baize.D.  On: 04/01/2022 14:07   DG Chest Port 1 View  Result Date: 04/01/2022 CLINICAL DATA:  Questionable sepsis.  Shortness of breath. EXAM: PORTABLE CHEST 1 VIEW COMPARISON:  Chest radiographs 08/06/2021 FINDINGS: The cardiac  silhouette is upper limits of normal in size. There is unchanged enlargement of the pulmonary arteries. Aortic atherosclerosis is noted. The lungs are hyperinflated with chronic peribronchial thickening and underlying emphysema. The interstitial markings are slightly more prominent diffusely compared to the prior study. No focal airspace consolidation, sizeable pleural effusion, or pneumothorax is identified. No acute osseous abnormality is seen. IMPRESSION: COPD with superimposed mild edema or infection not excluded. Electronically Signed   By: Logan Bores M.D.   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 Still on 4 liters. Ok to go home on 4 liters, will continue to lower as indicated on out patient basis.    -DuoNebs every 8 hours as needed, steroids  -continue Daliresp 500 PO daily   -zithromax 250 po daily ok to d/c in am    Ok to transition to po prednisone- 40 mg q and wean off in 7 days     -will like to see in out paieny in 7 days post d/c   -wean fio2 as tolerated to her baseline   Acute interstitial pulmonary edema  Likely related to pulmonary HTN and CHF - spiranolactone 65m daily with one dose now - PT/OT     OSA with Pickwikian syndrome   Continue - BIPAP      Chronic pulmonary hypertension no advancing right heart symptoms    C/w Ambrisentan 576mpo daily    Headaches  Neuromuscular.cervical djd ( Cervical mri)  - neurosurgery aware     Thank you for allowing me to participate in the care of this patient.   Patient/Family are satisfied with care plan and all questions have been answered.  This document was prepared using Dragon voice recognition software and may include unintentional dictation errors.     HeWallene HuhM.D.  Division of [facility]

## 2022-04-07 NOTE — Progress Notes (Signed)
PROGRESS NOTE    Theresa [name]  FOY:[id number] DOB: September 09, 1956  DOA: 04/01/2022 Date of Service: 04/07/22 PCP: Birdie Sons, MD     Brief Narrative / Hospital Course:   66 y.o. female with medical history significant of dCHF, hypertension, COPD on 2.5 L oxygen, GERD, hypothyroidism, depression with anxiety, tobacco abuse, obesity with BMI 32.92, OSA with BiPAP dependency, chronic pain syndrome, pulmonary hypertension, who presented on 04/01/2022 for evaluation of severe persistent occipital headache with associated scalp tenderness x3 days (had cataract surgery .4 days prior to ED visit), also with with wheezing and shortness of breath worsening over a few days. Admitted to the hospital and started on IV steroids for acute exacerbation COPD, IV diuresis given signs of CHF decompensation.  Started on trial of Lyrica for headache presumed due to occipital neuralgia.  Pulmonary following, patient continuing to require O2 via [place] at 4 L, her baseline at home is 2 to 2.5 L.  Hospitalist had reached out to patient's pain management clinic but they are unable to perform nerve block here. Lyrica increased, Fioricet prn, added Lidoderm, obtained MRI C-spine 06/21, see below for full report but did note mild spinal canal stenosis C4-C5 and C5-C6, up to severe foraminal narrowing left C3-C4 and C6-C7, right C5-C6.  Secure chat sent to neurosurgery, Dr. Cari Caraway 06/22, states no intervention needed inpatient but is happy to get her set up outpatient with his clinic.  To 06/22, she continued to have high oxygen demand up to 4 L/min nasal cannula.   Consultants:  Pulmonology  Procedures: None    Subjective: Patient reports headache persists, heat to the neck seems to be helping. Reprots some mild tingling in her fingers, no weakness      ASSESSMENT & PLAN:   Principal Problem:   Acute on chronic respiratory failure with hypoxia (HCC) Active Problems:   COPD exacerbation (HCC)   Headache    Acute on chronic diastolic CHF (congestive heart failure) (HCC)   Essential (primary) hypertension   Hypothyroidism   Depression with anxiety   Tobacco abuse   Sleep apnea   BiPAP (biphasic positive airway pressure) dependence   Chronic pain   Pulmonary hypertension (HCC)   Obesity (BMI 30-39.9)   Degenerative arthritis of cervical spine   Acute on chronic respiratory failure with hypoxia (HCC) Acute on chronic respiratory failure with hypoxia due to combination of CHF and COPD exacerbation.  In addition to signs of CHF decompensation, patient also presented with significant wheezing and poor aeration on auscultation, indicating concurrent COPD with acute exacerbation.  At baseline, patient uses 2.5-3 L/min supplemental O2 due to pulmonary hypertension and COPD.  Patient reports recently completing a long course of steroids. On admission, was requiring up to 6 L/min with O2 sat 80% on 3 L/min O2 and progressive worsening shortness of breath. 6/17: Diffuse expiratory high-pitched wheezes, poor air movement on 4 L/min O2 6/19: Still on 4-5 L/min, wheezing improved Pulmonology Dr. Raul Del, consulted Continue IV Solu-Medrol 60 mg twice daily Continue scheduled DuoNebs As needed albuterol nebs P.o. Zithromax Continue home Trelegy, Daliresp, Letairis BiPAP overnight / PRN Supplemental O2 to maintain sats 88 to 93%  COPD exacerbation (HCC) Presented with tachycardia and tachypnea, expiratory wheezes and poor air movement on exam.  Unlikely infectious etiology given no fever or leukocytosis, lactic acid normal, no focal infiltrates on CXR.  Sepsis was ruled out. Management as outlined for acute on chronic respiratory failure  Acute on chronic diastolic CHF (congestive heart failure) ([place]) Echo this  admission with EF 55 to 12%, grade 1 diastolic dysfunction. Echo on 08/08/2021 showed EF 60-65%.  Presented with shortness of breath, pulmonary edema on chest x-ray, JVD, bibasilar crackles and  lower extremity edema. Net IO Since Admission: -12,410 mL [04/06/22 1439] 6/18: 2900 cc output.  Appears euvolemic.  Transitioned back to torsemide 6/20: still needs 4-5 L O2 above baseline, wt is up.  BNP up and CXR with edema  Resume Lasix 40 mg twice daily Hold home torsemide this evening (takes 80 mg in the morning, 40 in the evening) Strict I/O's and daily weights Low sodium diet Monitor renal function and electrolytes Follows with Dr. 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  No fevers.  CT head nonacute.  No temporal tenderness.  Has tenderness on palpation over occipital nerves bilaterally, R >L.  ESR minimally elevated 33.  CRP normal 0.9, less likely inflammatory etiology Started on very low-dose Lyrica 25 mg on admission without any improvement (patient declined gabapentin due to profound side effects including frequent falls went on in the past, weaned herself off).  So far no side effects with Lyrica, patient agreeable to uptitrating since not yet effective 6/20 - MRI brain negative, Increase Lyrica to 150 mg daily >>BID Also on IV steroids for COPD exacerbation Previous hospitalist had discussed with neurology:  Occipital nerve block recommended at pain clinic in close follow up if persistent (unable to get this done inpatient) Ice packs to posterior head Fioricet PRN, Lidoderm patches MRI results as noted, strong suspicion that  C-spine degenerative disease is causing much of her headache which she describes as upper neck pain radiating up into the head Consider neurology evaluation but would like to see how the Fioricet and Lidoderm to, symptoms do not seem consistent with migraine or other organic headache cause at this time  Pulmonary hypertension ([place]) Continue Letairis. Follows with Dr. Raul Del, consulted.  Obesity (BMI 30-39.9) Body mass index is 37.06 kg/m. Complicates overall care and prognosis.   Recommend lifestyle modifications including physical activity and diet for weight loss and overall long-term health.   Degenerative arthritis of cervical spine MRI C-spine 06/21, see below for full report but did note mild spinal canal stenosis C4-C5 and C5-C6, up to severe foraminal narrowing left C3-C4 and C6-C7, right C5-C6.  Likely contributing to headaches, MRI brain was normal, do not seem consistent with migraine Continue pain management, added Lidoderm patches today Secure chat sent to neurosurgery, Dr. Cari Caraway 06/22, states no intervention needed inpatient but is happy to get her set up outpatient with his clinic.       DVT prophylaxis: lovenox Code Status: FULL Family Communication: none at this eime Disposition Plan / TOC needs: home when ready Barriers to discharge / significant pending items: decreased O2 requirement, can hopefully go home tomorrow or following day              Objective: Vitals:   04/07/22 0500 04/07/22 0758 04/07/22 0812 04/07/22 1129  BP:  130/81  119/68  Pulse:  82  (!) 102  Resp:  19  19  Temp:  98 F (36.7 C)  98.3 F (36.8 C)  TempSrc:    Oral  SpO2:  100% 100% 90%  Weight: 92.6 kg     Height:        Intake/Output Summary (Last 24 hours) at 04/07/2022 1429 Last data filed at 04/07/2022 1131 Gross per 24 hour  Intake 480 ml  Output 5000 ml  Net -4520 ml   Filed Weights   04/05/22 0410 04/06/22 0315 04/07/22 0500  Weight: 95 kg 95.4 kg 92.6  kg    Examination:  Constitutional:  VS as above General Appearance: alert, well-developed, well-nourished, NAD Ears, Nose, Mouth, Throat: Normal appearance Neck: No masses, trachea midline Respiratory: Normal respiratory effort Breath sounds diffuse wheeze Cardiovascular: S1/S2 normal, RRR Trace lower extremity edema Gastrointestinal: Nontender, no masses Musculoskeletal:  No clubbing/cyanosis of digits Neurological: No cranial nerve deficit on limited exam Psychiatric: Normal judgment/insight Normal mood and affect       Scheduled Medications:   ambrisentan  5 mg Oral Daily   aspirin EC  81 mg Oral Daily   azithromycin  250 mg Oral Daily   Besifloxacin HCl  1 drop Right Eye TID   Bromfenac Sodium  1 drop Right Eye QHS   cholecalciferol  2,000 Units Oral Daily   Difluprednate  1 drop Right Eye TID   Followed by   Derrill Memo ON 04/12/2022] Difluprednate  1 drop Right Eye BID   Followed by   Derrill Memo ON 04/19/2022] Difluprednate  1 drop Right Eye Daily   enoxaparin (LOVENOX) injection  0.5 mg/kg Subcutaneous Q24H   fluticasone furoate-vilanterol  1 puff Inhalation Daily   And   umeclidinium bromide  1 puff Inhalation Daily   ipratropium-albuterol  3 mL Nebulization TID   levothyroxine  25 mcg Oral Q0600   lidocaine  1-2 patch Transdermal Q24H   methylPREDNISolone (SOLU-MEDROL) injection  40 mg Intravenous Q12H   nicotine  21 mg Transdermal Daily   pregabalin  150 mg Oral BID   roflumilast  500 mcg Oral Daily   sertraline  200 mg Oral q morning   spironolactone  50 mg Oral Daily   torsemide  60 mg Oral BID    Continuous Infusions:   PRN Medications:  acetaminophen, albuterol, butalbital-acetaminophen-caffeine, dextromethorphan-guaiFENesin, hydrALAZINE, HYDROcodone-acetaminophen, ondansetron (ZOFRAN) IV, mouth rinse, sodium chloride  Antimicrobials:  Anti-infectives (From admission, onward)    Start     Dose/Rate Route Frequency Ordered Stop   04/06/22 1000   azithromycin (ZITHROMAX) tablet 250 mg        250 mg Oral Daily 04/05/22 1052     04/02/22 1000  azithromycin (ZITHROMAX) tablet 250 mg  Status:  Discontinued       See Hyperspace for full Linked Orders Report.   250 mg Oral Daily 04/01/22 1039 04/01/22 1044   04/02/22 1000  azithromycin (ZITHROMAX) tablet 250 mg        250 mg Oral Daily 04/01/22 1044 04/05/22 0901   04/01/22 1045  azithromycin (ZITHROMAX) tablet 500 mg  Status:  Discontinued       See Hyperspace for full Linked Orders Report.   500 mg Oral Daily 04/01/22 1039 04/01/22 1044   04/01/22 0815  cefTRIAXone (ROCEPHIN) 2 g in sodium chloride 0.9 % 100 mL IVPB  Status:  Discontinued        2 g 200 mL/hr over 30 Minutes Intravenous Every 24 hours 04/01/22 0803 04/01/22 1039   04/01/22 0815  azithromycin (ZITHROMAX) 500 mg  in sodium chloride 0.9 % 250 mL IVPB  Status:  Discontinued        500 mg 250 mL/hr over 60 Minutes Intravenous Every 24 hours 04/01/22 0803 04/01/22 1039       Data Reviewed: I have personally reviewed following labs and imaging studies  CBC: Recent Labs  Lab 04/01/22 0803 04/04/22 0518 04/07/22 0523  WBC 7.7 8.3 9.6  NEUTROABS 6.1  --   --   HGB 14.2 13.0 12.5  HCT 46.1* 42.4 40.3  MCV 99.6 102.7* 100.5*  PLT 162 164 846   Basic Metabolic Panel: Recent Labs  Lab 04/01/22 0803 04/02/22 0419 04/04/22 0518 04/06/22 0547 04/07/22 0523  NA 139 144 144 141 141  K 3.9 3.7 4.9 4.5 4.0  CL 103 108 108 102 101  CO2 30 32 32 34* 33*  GLUCOSE 106* 136* 137* 130* 123*  BUN 16 25* 31* 27* 30*  CREATININE 0.54 0.61 0.64 0.59 0.63  CALCIUM 9.3 9.2 9.2 9.0 8.9  MG  --  2.6*  --   --   --    GFR: Estimated Creatinine Clearance: 75.3 mL/min (by C-G formula based on SCr of 0.63 mg/dL). Liver Function Tests: Recent Labs  Lab 04/01/22 0803  AST 20  ALT 21  ALKPHOS 74  BILITOT 0.4  PROT 7.2  ALBUMIN 3.7   No results for input(s): "LIPASE", "AMYLASE" in the last 168 hours. No results for  input(s): "AMMONIA" in the last 168 hours. Coagulation Profile: Recent Labs  Lab 04/01/22 0803  INR 0.9   Cardiac Enzymes: No results for input(s): "CKTOTAL", "CKMB", "CKMBINDEX", "TROPONINI" in the last 168 hours. BNP (last 3 results) No results for input(s): "PROBNP" in the last 8760 hours. HbA1C: No results for input(s): "HGBA1C" in the last 72 hours. CBG: No results for input(s): "GLUCAP" in the last 168 hours. Lipid Profile: No results for input(s): "CHOL", "HDL", "LDLCALC", "TRIG", "CHOLHDL", "LDLDIRECT" in the last 72 hours. Thyroid Function Tests: No results for input(s): "TSH", "T4TOTAL", "FREET4", "T3FREE", "THYROIDAB" in the last 72 hours. Anemia Panel: No results for input(s): "VITAMINB12", "FOLATE", "FERRITIN", "TIBC", "IRON", "RETICCTPCT" in the last 72 hours. Urine analysis:    Component Value Date/Time   COLORURINE STRAW (A) 04/01/2022 0803   APPEARANCEUR CLEAR (A) 04/01/2022 0803   APPEARANCEUR Cloudy (A) 11/28/2016 1128   LABSPEC 1.006 04/01/2022 0803   LABSPEC 1.011 06/12/2014 2003   PHURINE 6.0 04/01/2022 0803   GLUCOSEU NEGATIVE 04/01/2022 0803   GLUCOSEU Negative 06/12/2014 2003   HGBUR SMALL (A) 04/01/2022 0803   BILIRUBINUR NEGATIVE 04/01/2022 0803   BILIRUBINUR Negative 10/30/2019 1516   BILIRUBINUR Negative 11/28/2016 1128   BILIRUBINUR Negative 06/12/2014 2003   KETONESUR NEGATIVE 04/01/2022 0803   PROTEINUR NEGATIVE 04/01/2022 0803   UROBILINOGEN 0.2 10/30/2019 1516   UROBILINOGEN 0.2 07/30/2013 1305   NITRITE NEGATIVE 04/01/2022 0803   LEUKOCYTESUR NEGATIVE 04/01/2022 0803   LEUKOCYTESUR Negative 06/12/2014 2003   Sepsis Labs: _0 (procalcitonin:4,lacticidven:4)  Recent Results (from the past 240 hour(s))  Resp Panel by RT-PCR (Flu A&B, Covid) Anterior Nasal Swab     Status: None   Collection Time: 04/01/22  8:03 AM   Specimen: Anterior Nasal Swab  Result Value Ref Range Status   SARS Coronavirus 2 by RT PCR NEGATIVE NEGATIVE  Final    Comment: (NOTE) SARS-CoV-2 target nucleic acids are NOT DETECTED.  The SARS-CoV-2 RNA is generally detectable in upper respiratory specimens during the acute phase of infection. The lowest concentration of SARS-CoV-2 viral copies  this assay can detect is 138 copies/mL. A negative result does not preclude SARS-Cov-2 infection and should not be used as the sole basis for treatment or other patient management decisions. A negative result may occur with  improper specimen collection/handling, submission of specimen other than nasopharyngeal swab, presence of viral mutation(s) within the areas targeted by this assay, and inadequate number of viral copies(<138 copies/mL). A negative result must be combined with clinical observations, patient history, and epidemiological information. The expected result is Negative.  Fact Sheet for Patients:  EntrepreneurPulse.com.au  Fact Sheet for Healthcare Providers:  IncredibleEmployment.be  This test is no t yet approved or cleared by the [place] FDA and  has been authorized for detection and/or diagnosis of SARS-CoV-2 by FDA under an Emergency Use Authorization (EUA). This EUA will remain  in effect (meaning this test can be used) for the duration of the COVID-19 declaration under Section 564(b)(1) of the Act, 21 U.S.C.section 360bbb-3(b)(1), unless the authorization is terminated  or revoked sooner.       Influenza A by PCR NEGATIVE NEGATIVE Final   Influenza B by PCR NEGATIVE NEGATIVE Final    Comment: (NOTE) The Xpert Xpress SARS-CoV-2/FLU/RSV plus assay is intended as an aid in the diagnosis of influenza from Nasopharyngeal swab specimens and should not be used as a sole basis for treatment. Nasal washings and aspirates are unacceptable for Xpert Xpress SARS-CoV-2/FLU/RSV testing.  Fact Sheet for Patients: EntrepreneurPulse.com.au  Fact Sheet for Healthcare  Providers: IncredibleEmployment.be  This test is not yet approved or cleared by the [place] FDA and has been authorized for detection and/or diagnosis of SARS-CoV-2 by FDA under an Emergency Use Authorization (EUA). This EUA will remain in effect (meaning this test can be used) for the duration of the COVID-19 declaration under Section 564(b)(1) of the Act, 21 U.S.C. section 360bbb-3(b)(1), unless the authorization is terminated or revoked.  Performed at [facility], [address] [place], [place] [address]   Blood Culture (routine x 2)     Status: None   Collection Time: 04/01/22  8:03 AM   Specimen: BLOOD  Result Value Ref Range Status   Specimen Description BLOOD RIGHT ANTECUBITAL  Final   Special Requests   Final    BOTTLES DRAWN AEROBIC AND ANAEROBIC Blood Culture results may not be optimal due to an inadequate volume of blood received in culture bottles   Culture   Final    NO GROWTH 5 DAYS Performed at [facility], [address], [place], [place] [address]    Report Status 04/06/2022 FINAL  Final  Urine Culture     Status: None   Collection Time: 04/01/22  8:03 AM   Specimen: Urine, Clean Catch  Result Value Ref Range Status   Specimen Description   Final    URINE, CLEAN CATCH Performed at [facility], [address] [place], [place] [address]    Special Requests   Final    NONE Performed at [facility], [address] [place], [place] [address]    Culture   Final    NO GROWTH Performed at [facility], [place] [address] [place], [place] [address]    Report Status 04/02/2022 FINAL  Final  Blood Culture (routine x 2)     Status: None   Collection Time: 04/01/22  8:08 AM   Specimen: BLOOD  Result Value Ref Range Status   Specimen Description BLOOD LEFT ANTECUBITAL  Final   Special Requests  Final    BOTTLES DRAWN AEROBIC AND ANAEROBIC Blood Culture results may not be optimal due to an  inadequate volume of blood received in culture bottles   Culture   Final    NO GROWTH 5 DAYS Performed at [facility], [address] [place], [place] [address]    Report Status 04/06/2022 FINAL  Final  Respiratory (~20 pathogens) panel by PCR     Status: None   Collection Time: 04/01/22 10:41 AM   Specimen: Nasopharyngeal Swab; Respiratory  Result Value Ref Range Status   Adenovirus NOT DETECTED NOT DETECTED Final   Coronavirus 229E NOT DETECTED NOT DETECTED Final    Comment: (NOTE) The Coronavirus on the Respiratory Panel, DOES NOT test for the novel  Coronavirus (2019 nCoV)    Coronavirus HKU1 NOT DETECTED NOT DETECTED Final   Coronavirus NL63 NOT DETECTED NOT DETECTED Final   Coronavirus OC43 NOT DETECTED NOT DETECTED Final   Metapneumovirus NOT DETECTED NOT DETECTED Final   Rhinovirus / Enterovirus NOT DETECTED NOT DETECTED Final   Influenza A NOT DETECTED NOT DETECTED Final   Influenza B NOT DETECTED NOT DETECTED Final   Parainfluenza Virus 1 NOT DETECTED NOT DETECTED Final   Parainfluenza Virus 2 NOT DETECTED NOT DETECTED Final   Parainfluenza Virus 3 NOT DETECTED NOT DETECTED Final   Parainfluenza Virus 4 NOT DETECTED NOT DETECTED Final   Respiratory Syncytial Virus NOT DETECTED NOT DETECTED Final   Bordetella pertussis NOT DETECTED NOT DETECTED Final   Bordetella Parapertussis NOT DETECTED NOT DETECTED Final   Chlamydophila pneumoniae NOT DETECTED NOT DETECTED Final   Mycoplasma pneumoniae NOT DETECTED NOT DETECTED Final    Comment: Performed at [facility], [place]. [address], [place], [place] [address]         Radiology Studies last 96 hours: MR CERVICAL SPINE WO CONTRAST  Result Date: 04/07/2022 CLINICAL DATA:  Cervical myelopathy EXAM: MRI CERVICAL SPINE WITHOUT CONTRAST TECHNIQUE: Multiplanar, multisequence MR imaging of the cervical spine was performed. No intravenous contrast was administered. COMPARISON:  12/25/2007 MRI cervical spine  10/11/2019 CT cervical spine FINDINGS: Evaluation is somewhat limited by motion artifact. Alignment: Straightening and mild reversal of the normal cervical lordosis. No listhesis. Vertebrae: No acute fracture or suspicious osseous lesion. T1 and T2 hyperintense focus in the C5 vertebral body, most likely a benign hemangioma. Vertebral body heights are preserved. Congenitally short pedicles, which narrow the AP diameter of the spinal canal. Cord: Normal signal and morphology. Previously noted prominence of the central canal at the level of C7 is no longer seen. Posterior Fossa, vertebral arteries, paraspinal tissues: Negative. Disc levels: C2-C3: No significant disc bulge. No spinal canal stenosis or neural foraminal narrowing. C3-C4: Minimal disc bulge. Facet and uncovertebral hypertrophy, left-greater-than-right. No spinal canal stenosis. Mild-to-moderate right and severe left neural foraminal narrowing, which have progressed from the prior MRI . C4-C5: No significant disc bulge. Facet and uncovertebral hypertrophy. Moderate left and mild right neural foraminal narrowing, which have progressed from the prior exam. C5-C6: Minimal disc bulge. Ligamentum flavum hypertrophy. Facet and uncovertebral hypertrophy. Mild spinal canal stenosis, with severe right and moderate to severe left neural foraminal narrowing, which have progressed from the prior exam. C6-C7: Disc height loss and minimal disc bulge. Facet and uncovertebral hypertrophy. Ligamentum flavum hypertrophy. Mild spinal canal stenosis overall unchanged. Mild-to-moderate right and severe left neural foraminal narrowing, which have progressed from prior exam. C7-T1: No significant disc bulge. No spinal canal stenosis or neural foraminal narrowing. IMPRESSION: 1. Evaluation is  limited by motion artifact. Within this limitation, there progressive degenerative changes compared to 2009, with mild spinal canal stenosis C4-C5 and C5-C6. 2. Multilevel facet and  uncovertebral hypertrophy, which causes up to severe neural foraminal narrowing, which is seen on the left at C3-C4 and C6-C7 and on the right at C5-C6. Additional less significant neural foraminal narrowing, as described above. Electronically Signed   By: Merilyn Baba M.D.   On: 04/07/2022 00:59   DG Chest Port 1 View  Result Date: 04/05/2022 CLINICAL DATA:  Shortness of breath, hypoxia EXAM: PORTABLE CHEST 1 VIEW COMPARISON:  04/01/2022 FINDINGS: No significant change in AP portable examination. Cardiomegaly with mild, diffuse interstitial opacity and pulmonary vascular prominence. The visualized skeletal structures are unremarkable. IMPRESSION: No significant change in AP portable examination. Cardiomegaly with mild, diffuse interstitial opacity and pulmonary vascular prominence, findings most likely reflect edema. No new or focal airspace opacity. Electronically Signed   By: Delanna Ahmadi M.D.   On: 04/05/2022 09:32   MR BRAIN WO CONTRAST  Result Date: 04/04/2022 CLINICAL DATA:  Occipital region headache and scalp tenderness. EXAM: MRI HEAD WITHOUT CONTRAST TECHNIQUE: Multiplanar, multiecho pulse sequences of the brain and surrounding structures were obtained without intravenous contrast. COMPARISON:  Head CT 04/01/2022. Head MRI 10/11/2019 and 12/10/2017. FINDINGS: Brain: There is no evidence of an acute infarct, intracranial hemorrhage, mass, midline shift, or extra-axial fluid collection. Patchy T2 hyperintensities in the cerebral white matter, basal ganglia, thalami, and brainstem are stable to mildly increased compared to the prior MRI and are nonspecific but compatible with moderate chronic small vessel ischemic disease. The ventricles and sulci are within normal limits for age. Vascular: Major intracranial vascular flow voids are preserved. Skull and upper cervical spine: Diffuse bone marrow heterogeneity including increased trace diffusion weighted signal intensity, chronic based on the 2019 MRI  and nonspecific. No discrete focal lesion. Sinuses/Orbits: Bilateral cataract extraction. Paranasal sinuses and mastoid air cells are clear. Other: None. IMPRESSION: 1. No acute intracranial abnormality. 2. Moderate chronic small vessel ischemic disease. Electronically Signed   By: Logan Bores M.D.   On: 04/04/2022 14:30            LOS: 6 days     Emeterio Reeve, DO [facility] 04/07/2022, 2:29 PM   Staff may message me via secure chat in [place]  but this may not receive immediate response,  please page for urgent matters!  If 7PM-7AM, please contact night-coverage [email or web address]  Dictation software was used to generate the above note. Typos may occur and escape review, as with typed/written notes. Please contact Dr Sheppard Coil directly for clarity if needed.

## 2022-04-07 NOTE — Progress Notes (Signed)
PT placed on NIV for HS with a 4L O2 bleed. Tolerating well at this time. SpO2 94%.

## 2022-04-08 ENCOUNTER — Encounter: Payer: Self-pay | Admitting: Radiology

## 2022-04-08 ENCOUNTER — Inpatient Hospital Stay: Payer: Medicare HMO

## 2022-04-08 DIAGNOSIS — R4701 Aphasia: Secondary | ICD-10-CM

## 2022-04-08 DIAGNOSIS — M5481 Occipital neuralgia: Secondary | ICD-10-CM | POA: Diagnosis not present

## 2022-04-08 DIAGNOSIS — J9621 Acute and chronic respiratory failure with hypoxia: Secondary | ICD-10-CM | POA: Diagnosis not present

## 2022-04-08 LAB — BLOOD GAS, ARTERIAL
Acid-Base Excess: 15.5 mmol/L — ABNORMAL HIGH (ref 0.0–2.0)
Bicarbonate: 43.1 mmol/L — ABNORMAL HIGH (ref 20.0–28.0)
O2 Saturation: 92.5 %
Patient temperature: 37
pCO2 arterial: 65 mmHg — ABNORMAL HIGH (ref 32–48)
pH, Arterial: 7.43 (ref 7.35–7.45)
pO2, Arterial: 66 mmHg — ABNORMAL LOW (ref 83–108)

## 2022-04-08 LAB — LIPID PANEL
Cholesterol: 175 mg/dL (ref 0–200)
HDL: 58 mg/dL (ref >40)
LDL Cholesterol: 86 mg/dL (ref 0–99)
Total CHOL/HDL Ratio: 3 RATIO
Triglycerides: 154 mg/dL — ABNORMAL HIGH (ref <150)
VLDL: 31 mg/dL (ref 0–40)

## 2022-04-08 LAB — HEMOGLOBIN A1C
Hgb A1c MFr Bld: 5.7 % — ABNORMAL HIGH (ref 4.8–5.6)
Mean Plasma Glucose: 116.89 mg/dL

## 2022-04-08 LAB — GLUCOSE, CAPILLARY: Glucose-Capillary: 82 mg/dL (ref 70–99)

## 2022-04-08 MED ORDER — PREDNISONE 20 MG PO TABS
40.0000 mg | ORAL_TABLET | Freq: Every day | ORAL | Status: DC
  Administered 2022-04-09: 40 mg via ORAL
  Filled 2022-04-08: qty 2

## 2022-04-08 MED ORDER — LIDOCAINE HCL (PF) 1 % IJ SOLN
5.0000 mL | Freq: Once | INTRAMUSCULAR | Status: AC
  Administered 2022-04-08: 5 mL
  Filled 2022-04-08: qty 5

## 2022-04-08 MED ORDER — BUPIVACAINE HCL (PF) 0.25 % IJ SOLN
10.0000 mL | Freq: Once | INTRAMUSCULAR | Status: AC
  Administered 2022-04-08: 10 mL
  Filled 2022-04-08: qty 10

## 2022-04-08 MED ORDER — IOHEXOL 350 MG/ML SOLN
75.0000 mL | Freq: Once | INTRAVENOUS | Status: AC | PRN
Start: 2022-04-08 — End: 2022-04-08
  Administered 2022-04-08: 75 mL via INTRAVENOUS

## 2022-04-08 NOTE — Consult Note (Signed)
   Heart Failure Nurse Navigator Note  HFpEF 60 to 65%.  Grade 1 diastolic dysfunction.  Right ventricular systolic function is low normal.  Right ventricle is mildly enlarged.  Mild biatrial enlargement.   She presented to the emergency room with complaints of worsening shortness of breath over 3 days and headache.  Was found to have an O2 saturation of 80% on 3 L of O2.  Chest x-ray revealed COPD and pulmonary edema.   Comorbidities:  Hypertension COPD on 2 L of O2 GERD Hypothyroidism Depression/anxiety Tobacco abuse Obesity Obstructive sleep apnea with BiPAP Pulmonary hypertension  Medications:  Aspirin 81 mg daily Levothyroxine 25 mcg daily NicoDerm patch daily Spironolactone 50 mg daily Torsemide 60 mg 2 times a day  Labs:  BNP 101, sodium 141, potassium 4, chloride 101, CO2 33, BUN 30, creatinine 0.63, GFR greater than 60, total cholesterol 175, triglycerides 154, HDL 58, LDL 86. Weight is 93.4 kg Blood pressure 98/49 Intake 240 mL Output 4650 mL   Initial meeting with patient and her husband who was at the bedside.  Discussed how she takes care of herself at home.  She does admit to weighing herself every day.  She states she has been known to dose her diuretic herself if she happens to notice a weight gain.  She does not follow of fluid restriction as she states that she has been told with her COPD that increasing her fluid intake is her friend.  Planes of that defeats the purpose of taking a water pill, explained that most of her heart failure patients are on a restriction of 64 ounces daily.  I asked that they discuss this with her pulmonologist.  They voiced understanding.  She does not use salt but did does use hot sauce for seasoning, asked that they check the sodium content in the amount that they are using on their foods.  Discussed weight gains to report to Ms. Hackney in the outpatient heart failure clinic for which she has an appointment on July 19 at  10 AM.  She has a 5% no-show ratio which is 14 out of 263 appointments.  Explained the ventricle health program to the patient and her husband she is wanting to apply, application sent to company.  They had no further questions at this time.  Tresa Endo RN CHFN

## 2022-04-08 NOTE — Care Management Important Message (Signed)
Important Message  Patient Details  Name: Cona Detoro MRN: [id number] Date of Birth: [birth year]   Medicare Important Message Given:  Yes     Olegario Messier A Jihan Mellette 04/08/2022, 2:15 PM

## 2022-04-08 NOTE — Assessment & Plan Note (Signed)
Episode the morning of 04/08/2022, code stroke called and neurology evaluated patient.  Drowsiness versus TIA,  Risk stratification with vascular imaging as above, no significant concerns on CTA  lipid Panel pending  Already had echocardiogram and patient has been in sinus rhythm  Checking ABG, some concern for her Procardia/respiratory failure as concern for drowsiness  MRI pending

## 2022-04-09 ENCOUNTER — Inpatient Hospital Stay: Payer: Medicare HMO

## 2022-04-09 DIAGNOSIS — J9621 Acute and chronic respiratory failure with hypoxia: Secondary | ICD-10-CM | POA: Diagnosis not present

## 2022-04-09 DIAGNOSIS — R4701 Aphasia: Secondary | ICD-10-CM | POA: Diagnosis not present

## 2022-04-09 LAB — BASIC METABOLIC PANEL
Anion gap: 6 (ref 5–15)
BUN: 30 mg/dL — ABNORMAL HIGH (ref 8–23)
CO2: 37 mmol/L — ABNORMAL HIGH (ref 22–32)
Calcium: 9 mg/dL (ref 8.9–10.3)
Chloride: 97 mmol/L — ABNORMAL LOW (ref 98–111)
Creatinine, Ser: 0.61 mg/dL (ref 0.44–1.00)
GFR, Estimated: >60 mL/min (ref >60)
Glucose, Bld: 83 mg/dL (ref 70–99)
Potassium: 3.7 mmol/L (ref 3.5–5.1)
Sodium: 140 mmol/L (ref 135–145)

## 2022-04-09 LAB — CBC
HCT: 40.8 % (ref 36.0–46.0)
Hemoglobin: 12.7 g/dL (ref 12.0–15.0)
MCH: 31.5 pg (ref 26.0–34.0)
MCHC: 31.1 g/dL (ref 30.0–36.0)
MCV: 101.2 fL — ABNORMAL HIGH (ref 80.0–100.0)
Platelets: 158 10*3/uL (ref 150–400)
RBC: 4.03 MIL/uL (ref 3.87–5.11)
RDW: 16 % — ABNORMAL HIGH (ref 11.5–15.5)
WBC: 8.8 10*3/uL (ref 4.0–10.5)
nRBC: 0 % (ref 0.0–0.2)

## 2022-04-09 MED ORDER — PREDNISONE 10 MG PO TABS: ORAL_TABLET | ORAL | 0 refills | Status: AC

## 2022-04-09 MED ORDER — BUTALBITAL-APAP-CAFFEINE 50-325-40 MG PO TABS: 1.0000 | ORAL_TABLET | Freq: Four times a day (QID) | ORAL | 0 refills | Status: DC | PRN

## 2022-04-09 MED ORDER — IOHEXOL 350 MG/ML SOLN
75.0000 mL | Freq: Once | INTRAVENOUS | Status: AC | PRN
Start: 2022-04-09 — End: 2022-04-09
  Administered 2022-04-09: 75 mL via INTRAVENOUS

## 2022-04-09 MED ORDER — TORSEMIDE 20 MG PO TABS: 40.0000 mg | ORAL_TABLET | Freq: Two times a day (BID) | ORAL | Status: DC

## 2022-04-09 MED ORDER — ACETAMINOPHEN 325 MG PO TABS: 325.0000 mg | ORAL_TABLET | Freq: Four times a day (QID) | ORAL | Status: DC | PRN

## 2022-04-09 MED ORDER — NICOTINE 21 MG/24HR TD PT24
21.0000 mg | MEDICATED_PATCH | Freq: Every day | TRANSDERMAL | 0 refills | Status: DC
Start: 2022-04-10 — End: 2022-08-03

## 2022-04-09 MED ORDER — SPIRONOLACTONE 50 MG PO TABS: 50.0000 mg | ORAL_TABLET | Freq: Every day | ORAL | 0 refills | Status: DC

## 2022-04-09 MED ORDER — DM-GUAIFENESIN ER 30-600 MG PO TB12: 1.0000 | ORAL_TABLET | Freq: Two times a day (BID) | ORAL | Status: DC | PRN

## 2022-04-09 NOTE — Progress Notes (Addendum)
Patient ambulated to bathroom from chair.   While ambulating, patient O2 saturations dropped to low-mid 70s on 4 L.   Patient was able to recover after 3-4 minutes back to 90% at rest.     Not long after, patient's heart rate jumped up into the upper 140s- lower 150s.  Paged MD Patient denies worsening SOB, dizziness/lightheadedness, and Chest pain.   Did endorse a feeling of "Foggy headed" feeling.    MD ordered stat EKG and CT of chest.   Before I could complete EKG (with patient lying in bed), heart rate improved with O2 sat in upper 80s to low 90s.     EKG obtained.   MD aware

## 2022-04-11 ENCOUNTER — Ambulatory Visit: Payer: Medicare HMO | Attending: Pain Medicine | Admitting: Pain Medicine

## 2022-04-11 ENCOUNTER — Other Ambulatory Visit: Payer: Self-pay | Admitting: Obstetrics and Gynecology

## 2022-04-11 ENCOUNTER — Other Ambulatory Visit: Payer: Self-pay

## 2022-04-11 ENCOUNTER — Encounter: Payer: Self-pay | Admitting: Pain Medicine

## 2022-04-11 ENCOUNTER — Other Ambulatory Visit: Payer: Self-pay | Admitting: Family Medicine

## 2022-04-11 VITALS — BP 103/63 | HR 102 | Temp 97.9°F | Resp 16 | Ht 62.0 in | Wt 213.0 lb

## 2022-04-11 DIAGNOSIS — G8929 Other chronic pain: Secondary | ICD-10-CM | POA: Diagnosis not present

## 2022-04-11 DIAGNOSIS — R937 Abnormal findings on diagnostic imaging of other parts of musculoskeletal system: Secondary | ICD-10-CM | POA: Diagnosis not present

## 2022-04-11 DIAGNOSIS — F411 Generalized anxiety disorder: Secondary | ICD-10-CM

## 2022-04-11 DIAGNOSIS — M5136 Other intervertebral disc degeneration, lumbar region: Secondary | ICD-10-CM | POA: Insufficient documentation

## 2022-04-11 DIAGNOSIS — M25551 Pain in right hip: Secondary | ICD-10-CM | POA: Diagnosis not present

## 2022-04-11 DIAGNOSIS — Z79891 Long term (current) use of opiate analgesic: Secondary | ICD-10-CM | POA: Diagnosis not present

## 2022-04-11 DIAGNOSIS — M79605 Pain in left leg: Secondary | ICD-10-CM | POA: Diagnosis not present

## 2022-04-11 DIAGNOSIS — M25562 Pain in left knee: Secondary | ICD-10-CM | POA: Insufficient documentation

## 2022-04-11 DIAGNOSIS — M25552 Pain in left hip: Secondary | ICD-10-CM | POA: Diagnosis not present

## 2022-04-11 DIAGNOSIS — Z79899 Other long term (current) drug therapy: Secondary | ICD-10-CM | POA: Insufficient documentation

## 2022-04-11 DIAGNOSIS — M47816 Spondylosis without myelopathy or radiculopathy, lumbar region: Secondary | ICD-10-CM | POA: Diagnosis not present

## 2022-04-11 DIAGNOSIS — M545 Low back pain, unspecified: Secondary | ICD-10-CM | POA: Diagnosis not present

## 2022-04-11 DIAGNOSIS — M79604 Pain in right leg: Secondary | ICD-10-CM | POA: Insufficient documentation

## 2022-04-11 DIAGNOSIS — G894 Chronic pain syndrome: Secondary | ICD-10-CM | POA: Diagnosis not present

## 2022-04-11 MED ORDER — HYDROCODONE-ACETAMINOPHEN 5-325 MG PO TABS: 1.0000 | ORAL_TABLET | Freq: Three times a day (TID) | ORAL | 0 refills | Status: DC | PRN

## 2022-04-12 ENCOUNTER — Telehealth: Payer: Self-pay

## 2022-04-14 ENCOUNTER — Ambulatory Visit (INDEPENDENT_AMBULATORY_CARE_PROVIDER_SITE_OTHER): Payer: Medicare HMO | Admitting: Physician Assistant

## 2022-04-14 ENCOUNTER — Encounter: Payer: Self-pay | Admitting: Physician Assistant

## 2022-04-14 VITALS — BP 136/71 | HR 92 | Temp 98.0°F | Resp 14 | Wt 199.8 lb

## 2022-04-14 DIAGNOSIS — E669 Obesity, unspecified: Secondary | ICD-10-CM | POA: Diagnosis not present

## 2022-04-14 DIAGNOSIS — Z9981 Dependence on supplemental oxygen: Secondary | ICD-10-CM | POA: Diagnosis not present

## 2022-04-14 DIAGNOSIS — R0609 Other forms of dyspnea: Secondary | ICD-10-CM | POA: Diagnosis not present

## 2022-04-14 DIAGNOSIS — G4486 Cervicogenic headache: Secondary | ICD-10-CM | POA: Diagnosis not present

## 2022-04-14 DIAGNOSIS — Z716 Tobacco abuse counseling: Secondary | ICD-10-CM

## 2022-04-14 DIAGNOSIS — Z9989 Dependence on other enabling machines and devices: Secondary | ICD-10-CM | POA: Diagnosis not present

## 2022-04-14 DIAGNOSIS — E876 Hypokalemia: Secondary | ICD-10-CM | POA: Diagnosis not present

## 2022-04-14 DIAGNOSIS — J449 Chronic obstructive pulmonary disease, unspecified: Secondary | ICD-10-CM | POA: Diagnosis not present

## 2022-04-14 DIAGNOSIS — R69 Illness, unspecified: Secondary | ICD-10-CM | POA: Diagnosis not present

## 2022-04-14 DIAGNOSIS — R4701 Aphasia: Secondary | ICD-10-CM | POA: Diagnosis not present

## 2022-04-14 DIAGNOSIS — E039 Hypothyroidism, unspecified: Secondary | ICD-10-CM

## 2022-04-14 DIAGNOSIS — J441 Chronic obstructive pulmonary disease with (acute) exacerbation: Secondary | ICD-10-CM

## 2022-04-14 DIAGNOSIS — I5032 Chronic diastolic (congestive) heart failure: Secondary | ICD-10-CM | POA: Diagnosis not present

## 2022-04-14 DIAGNOSIS — I10 Essential (primary) hypertension: Secondary | ICD-10-CM | POA: Diagnosis not present

## 2022-04-14 DIAGNOSIS — F411 Generalized anxiety disorder: Secondary | ICD-10-CM | POA: Diagnosis not present

## 2022-04-14 NOTE — Progress Notes (Signed)
I,Theresa [name],acting as a Education administrator for [name], PA-C.,have documented all relevant documentation on the behalf of [name], PA-C,as directed by  [name], PA-C while in the presence of [name], PA-C.  Established patient visit   Patient: Theresa [name]   DOB: 1956/05/12   66 y.o. Female  MRN: [id number] Visit Date: 04/14/2022  Today's healthcare provider: [name], PA-C   No chief complaint on file.  Subjective   Follow up Hospitalization  Patient was admitted to [facility] on 04/01/22 and discharged on 04/09/22.  She was treated for headache and treated for Acute on chronic respiratory failure with hypoxia. Telephone follow up was done on 04/12/22 She reports good compliance with treatment. She reports this condition is improved.  ----------------------------------------------------------------------------------------- -     Medications: Outpatient Medications Prior to Visit  Medication Sig   acetaminophen (TYLENOL) 325 MG tablet Take 1 tablet (325 mg total) by mouth every 6 (six) hours as needed for mild pain or fever.   albuterol (VENTOLIN HFA) 108 (90 Base) MCG/ACT inhaler Inhale 2 puffs into the lungs every 6 (six) hours as needed for wheezing or shortness of breath.   ambrisentan (LETAIRIS) 5 MG tablet Take 5 mg by mouth daily.   aspirin EC 81 MG tablet Take 81 mg by mouth daily.   Besifloxacin HCl (BESIVANCE) 0.6 % SUSP Place 1 drop into the right eye 3 (three) times daily. For 2 weeks starting 03/29/22   Bromfenac Sodium (PROLENSA) 0.07 % SOLN Place 1 drop into the right eye at bedtime. For 4 weeks starting 03/29/22   butalbital-acetaminophen-caffeine (FIORICET) 50-325-40 MG tablet Take 1 tablet by mouth every 6 (six) hours as needed for headache. LIMIT USE TO PREVENT MEDICATION OVERUSE HEADACHE   Cholecalciferol 50 MCG (2000 UT) CAPS Take 2,000 Units by mouth daily.    dextromethorphan-guaiFENesin (MUCINEX DM) 30-600 MG 12hr tablet Take 1 tablet by  mouth 2 (two) times daily as needed for cough.   Difluprednate 0.05 % EMUL Place 1 drop into the right eye in the morning, at noon, in the evening, and at bedtime. One drop 4 times daily for one week starting 03/29/22. One drop 3 times daily for one week. One drop 2 times daily for one week. One drop once daily for one week, then stop.   Fluticasone-Umeclidin-Vilant 100-62.5-25 MCG/INH AEPB Inhale 1 puff into the lungs daily.   [START ON 05/01/2022] HYDROcodone-acetaminophen (NORCO/VICODIN) 5-325 MG tablet Take 1 tablet by mouth every 8 (eight) hours as needed for severe pain. Must last 30 days   [START ON 05/31/2022] HYDROcodone-acetaminophen (NORCO/VICODIN) 5-325 MG tablet Take 1 tablet by mouth every 8 (eight) hours as needed for severe pain. Must last 30 days   [START ON 06/30/2022] HYDROcodone-acetaminophen (NORCO/VICODIN) 5-325 MG tablet Take 1 tablet by mouth every 8 (eight) hours as needed for severe pain. Must last 30 days   ipratropium-albuterol (DUONEB) 0.5-2.5 (3) MG/3ML SOLN USE 1 VIAL VIA NEBULIZER EVERY 6 HOURS AS NEEDED FOR SHORTNESS OF BREATH OR WHEEZING   levothyroxine (SYNTHROID) 25 MCG tablet TAKE 1 TABLET(25 MCG) BY MOUTH DAILY BEFORE BREAKFAST   nicotine (NICODERM CQ - DOSED IN MG/24 HOURS) 21 mg/24hr patch Place 1 patch (21 mg total) onto the skin daily.   OXYGEN Inhale 2.5 L/min into the lungs.   potassium chloride SA (KLOR-CON M) 20 MEQ tablet Take 20 mEq by mouth 2 (two) times daily.   predniSONE (DELTASONE) 10 MG tablet Take 4 tablets (40 mg total) by mouth daily  with breakfast for 2 days, THEN 3 tablets (30 mg total) daily with breakfast for 2 days, THEN 2 tablets (20 mg total) daily with breakfast for 2 days, THEN 1 tablet (10 mg total) daily with breakfast for 2 days, THEN 0.5 tablets (5 mg total) daily with breakfast for 2 days.   roflumilast (DALIRESP) 500 MCG TABS tablet Take 500 mcg by mouth daily.   sertraline (ZOLOFT) 100 MG tablet TAKE 2 TABLETS BY MOUTH ONCE EVERY  MORNING   simvastatin (ZOCOR) 20 MG tablet Take 20 mg by mouth at bedtime.   spironolactone (ALDACTONE) 50 MG tablet Take 1 tablet (50 mg total) by mouth daily.   torsemide (DEMADEX) 20 MG tablet Take 2 tablets (40 mg total) by mouth 2 (two) times daily. Take 4 tablets QAM and 2 tablets QPM   No facility-administered medications prior to visit.    Review of Systems  Constitutional:  Positive for activity change and fatigue. Negative for chills.  HENT:  Positive for congestion.   Respiratory:  Positive for cough and shortness of breath. Negative for chest tightness.   Cardiovascular: Negative.   Musculoskeletal: Negative.        Objective    BP 136/71 (BP Location: Left Arm, Patient Position: Sitting, Cuff Size: Large)   Pulse 92   Temp 98 F (36.7 C) (Oral)   Resp 14   Wt 199 lb 12.8 oz (90.6 kg)   SpO2 94%   BMI 36.54 kg/m    Physical Exam Vitals reviewed.  Constitutional:      Appearance: Normal appearance. She is obese.  HENT:     Head: Normocephalic and atraumatic.  Neurological:     General: No focal deficit present.     Mental Status: She is alert and oriented to person, place, and time. Mental status is at baseline.     Results for orders placed or performed in visit on 04/14/22  CBC with Differential/Platelet  Result Value Ref Range   WBC 15.3 (H) 3.4 - 10.8 x10E3/uL   RBC 4.57 3.77 - 5.28 x10E6/uL   Hemoglobin 14.3 11.1 - 15.9 g/dL   Hematocrit 44.6 34.0 - 46.6 %   MCV 98 (H) 79 - 97 fL   MCH 31.3 26.6 - 33.0 pg   MCHC 32.1 31.5 - 35.7 g/dL   RDW 15.0 11.7 - 15.4 %   Platelets 167 150 - 450 x10E3/uL   Neutrophils 91 Not Estab. %   Lymphs 5 Not Estab. %   Monocytes 2 Not Estab. %   Eos 0 Not Estab. %   Basos 0 Not Estab. %   Neutrophils Absolute 14.1 (H) 1.4 - 7.0 x10E3/uL   Lymphocytes Absolute 0.7 0.7 - 3.1 x10E3/uL   Monocytes Absolute 0.2 0.1 - 0.9 x10E3/uL   EOS (ABSOLUTE) 0.0 0.0 - 0.4 x10E3/uL   Basophils Absolute 0.0 0.0 - 0.2 x10E3/uL    Immature Granulocytes 2 Not Estab. %   Immature Grans (Abs) 0.2 (H) 0.0 - 0.1 [id number]/QA  Basic metabolic panel  Result Value Ref Range   Glucose 124 (H) 70 - 99 mg/dL   BUN 26 8 - 27 mg/dL   Creatinine, Ser 0.82 0.57 - 1.00 mg/dL   eGFR 80 >59 mL/min/1.73   BUN/Creatinine Ratio 32 (H) 12 - 28   Sodium 140 134 - 144 mmol/L   Potassium 4.1 3.5 - 5.2 mmol/L   Chloride 99 96 - 106 mmol/L   CO2 24 20 - 29 mmol/L   Calcium 9.6 8.7 -  10.3 mg/dL  TSH  Result Value Ref Range   TSH 1.080 0.450 - 4.500 uIU/mL    Assessment & Plan     Pt was [facility] hospital [facility] 04[facility]09/22  COPD exacerbation Pulmonary HTN Oxygen saturation was 94% but abnormal lung exam Per chart review, "You have opted for discharge from the hospital prior to the medical team getting additional input from pulmonology to finalize plan for COPD. You are accepting the risks of being discharged prior to further consultation with pulmonology (Dr. Raul Del).  Per patient, a FU appt was scheduled with Dr. Raul Del  in 4 weeks to check for "middle lobe//pneumonia" Pt was strongly encouraged to proceed with close follow-up  She was discharged on prednisone taper and completed with a course of zithromax. Recommended to continue prednisone Continue with oxygen treatment for pulmonary htn and copd, and inhalers  - CXR ordered  Pt was Rxed ambrisentan on 01/13/22 Occipital neuralgia/cervical spine degenerative arthritis MRI C-spine 06/21, see below for full report but did note mild spinal canal stenosis C4-C5 and C5-C6, up to severe foraminal narrowing left C3-C4 and C6-C7, right C5-C6. Pt declined consultation with Dr. Cari Caraway and with Dr. Leonel Ramsay Pt was advised to avoid using OTC pain medications, was given a Rx for Fioricet and advised not to [place] this more than 1-2 times per week 6/20 - CT head nonacute, MRI brain negative, continue Lyrica 150 mg daily >>BID Ice packs to posterior head  advised Neurology occipital nerve block 04/08/2022 , pt requested to repeat nerve block Referral placed to Neurosurgery for evaluation/management   Acute on chronic diastolic CHF (congestive heart failure) ([place]) Echo this admission with EF 55 to 53%, grade 1 diastolic dysfunction.  An appt with Cardiology was scheduled for October Recommended close follow-up   Continue on torsemide BID /was prescribed on discharge, statin 20 mg, spironolactone 50 mg Low sodium diet advised    Pt was educated on concerning symptoms, particularly severe shortness of breath, weakness, confusion, chest pain, difficulty ambulating, or other concerns please seek emergency medical attention ASAP.  Essential (primary) hypertension BP today 136/71, at goal Continue Diuretics  Aphasia Per chart review, Episode the morning of 04/08/2022, code stroke called and neurology evaluated patient.  Drowsiness most likely medication effect or hypercarbia versus less likely TIA, MRI was normal, CTA H/N were normal   Hypothyroidism Continue Synthroid   Depression with anxiety Continue home Zoloft   Tobacco abuse Continue Nicotine patch   Sleep apnea Continue BiPAP at night  Obesity (BMI 30-39.9) Body mass index is 37.06 kg/m. Recommended lifestyle modifications including physical activity and diet for weight loss and overall long-term health.   The patient was advised to call back or seek an in-person evaluation if the symptoms worsen or if the condition fails to improve as anticipated.  I discussed the assessment and treatment plan with the patient. The patient was provided an opportunity to ask questions and all were answered. The patient agreed with the plan and demonstrated an understanding of the instructions.  The entirety of the information documented in the History of Present Illness, Review of Systems and Physical Exam were personally obtained by me. Portions of this information were initially  documented by the CMA and reviewed by me for thoroughness and accuracy.  Portions of this note were created using dictation software and may contain typographical errors.      [name], PA-C  [facility] [phone] (phone) [phone] (fax)  [place]

## 2022-04-15 LAB — CBC WITH DIFFERENTIAL/PLATELET
Basophils Absolute: 0 10*3/uL (ref 0.0–0.2)
Basos: 0 %
EOS (ABSOLUTE): 0 10*3/uL (ref 0.0–0.4)
Eos: 0 %
Hematocrit: 44.6 % (ref 34.0–46.6)
Hemoglobin: 14.3 g/dL (ref 11.1–15.9)
Immature Grans (Abs): 0.2 10*3/uL — ABNORMAL HIGH (ref 0.0–0.1)
Immature Granulocytes: 2 %
Lymphocytes Absolute: 0.7 10*3/uL (ref 0.7–3.1)
Lymphs: 5 %
MCH: 31.3 pg (ref 26.6–33.0)
MCHC: 32.1 g/dL (ref 31.5–35.7)
MCV: 98 fL — ABNORMAL HIGH (ref 79–97)
Monocytes Absolute: 0.2 10*3/uL (ref 0.1–0.9)
Monocytes: 2 %
Neutrophils Absolute: 14.1 10*3/uL — ABNORMAL HIGH (ref 1.4–7.0)
Neutrophils: 91 %
Platelets: 167 10*3/uL (ref 150–450)
RBC: 4.57 x10E6/uL (ref 3.77–5.28)
RDW: 15 % (ref 11.7–15.4)
WBC: 15.3 10*3/uL — ABNORMAL HIGH (ref 3.4–10.8)

## 2022-04-15 LAB — BASIC METABOLIC PANEL
BUN/Creatinine Ratio: 32 — ABNORMAL HIGH (ref 12–28)
BUN: 26 mg/dL (ref 8–27)
CO2: 24 mmol/L (ref 20–29)
Calcium: 9.6 mg/dL (ref 8.7–10.3)
Chloride: 99 mmol/L (ref 96–106)
Creatinine, Ser: 0.82 mg/dL (ref 0.57–1.00)
Glucose: 124 mg/dL — ABNORMAL HIGH (ref 70–99)
Potassium: 4.1 mmol/L (ref 3.5–5.2)
Sodium: 140 mmol/L (ref 134–144)
eGFR: 80 mL/min/{1.73_m2} (ref >59)

## 2022-04-15 LAB — TSH: TSH: 1.08 u[IU]/mL (ref 0.450–4.500)

## 2022-04-15 NOTE — Progress Notes (Signed)
[place] ,   Your labwork results all are within normal limits, stable for you except  Your WBC is 15.3 and neutrophils 14.1 which might indicated infection/inflammation. Please, continue your current treatment for your lung conditions.  Any questions please reach out to the office or message me on MyChart!  Best, [name], PA-C

## 2022-04-18 DIAGNOSIS — J449 Chronic obstructive pulmonary disease, unspecified: Secondary | ICD-10-CM | POA: Diagnosis not present

## 2022-04-21 DIAGNOSIS — J9610 Chronic respiratory failure, unspecified whether with hypoxia or hypercapnia: Secondary | ICD-10-CM | POA: Diagnosis not present

## 2022-04-23 ENCOUNTER — Emergency Department: Payer: Medicare HMO

## 2022-04-23 ENCOUNTER — Other Ambulatory Visit: Payer: Self-pay

## 2022-04-23 ENCOUNTER — Inpatient Hospital Stay
Admission: EM | Admit: 2022-04-23 | Discharge: 2022-04-29 | DRG: 308 | Disposition: A | Discharge status: Home Health | Payer: Medicare HMO | Attending: Internal Medicine | Admitting: Internal Medicine

## 2022-04-23 DIAGNOSIS — I509 Heart failure, unspecified: Secondary | ICD-10-CM

## 2022-04-23 DIAGNOSIS — R062 Wheezing: Secondary | ICD-10-CM | POA: Diagnosis not present

## 2022-04-23 DIAGNOSIS — I2721 Secondary pulmonary arterial hypertension: Secondary | ICD-10-CM | POA: Diagnosis not present

## 2022-04-23 DIAGNOSIS — R079 Chest pain, unspecified: Secondary | ICD-10-CM | POA: Diagnosis not present

## 2022-04-23 DIAGNOSIS — Z7951 Long term (current) use of inhaled steroids: Secondary | ICD-10-CM

## 2022-04-23 DIAGNOSIS — M5416 Radiculopathy, lumbar region: Secondary | ICD-10-CM | POA: Diagnosis present

## 2022-04-23 DIAGNOSIS — J9601 Acute respiratory failure with hypoxia: Secondary | ICD-10-CM | POA: Diagnosis not present

## 2022-04-23 DIAGNOSIS — I959 Hypotension, unspecified: Secondary | ICD-10-CM | POA: Diagnosis present

## 2022-04-23 DIAGNOSIS — I5032 Chronic diastolic (congestive) heart failure: Secondary | ICD-10-CM | POA: Diagnosis not present

## 2022-04-23 DIAGNOSIS — Z9981 Dependence on supplemental oxygen: Secondary | ICD-10-CM | POA: Diagnosis not present

## 2022-04-23 DIAGNOSIS — J439 Emphysema, unspecified: Secondary | ICD-10-CM | POA: Diagnosis present

## 2022-04-23 DIAGNOSIS — J9621 Acute and chronic respiratory failure with hypoxia: Secondary | ICD-10-CM | POA: Diagnosis not present

## 2022-04-23 DIAGNOSIS — Z79899 Other long term (current) drug therapy: Secondary | ICD-10-CM | POA: Diagnosis not present

## 2022-04-23 DIAGNOSIS — I480 Paroxysmal atrial fibrillation: Secondary | ICD-10-CM | POA: Diagnosis present

## 2022-04-23 DIAGNOSIS — I4891 Unspecified atrial fibrillation: Secondary | ICD-10-CM | POA: Diagnosis not present

## 2022-04-23 DIAGNOSIS — Z79890 Hormone replacement therapy (postmenopausal): Secondary | ICD-10-CM

## 2022-04-23 DIAGNOSIS — G894 Chronic pain syndrome: Secondary | ICD-10-CM | POA: Diagnosis present

## 2022-04-23 DIAGNOSIS — F419 Anxiety disorder, unspecified: Secondary | ICD-10-CM | POA: Diagnosis not present

## 2022-04-23 DIAGNOSIS — I110 Hypertensive heart disease with heart failure: Secondary | ICD-10-CM | POA: Diagnosis not present

## 2022-04-23 DIAGNOSIS — Z823 Family history of stroke: Secondary | ICD-10-CM | POA: Diagnosis not present

## 2022-04-23 DIAGNOSIS — F32A Depression, unspecified: Secondary | ICD-10-CM | POA: Diagnosis not present

## 2022-04-23 DIAGNOSIS — E782 Mixed hyperlipidemia: Secondary | ICD-10-CM

## 2022-04-23 DIAGNOSIS — J9811 Atelectasis: Secondary | ICD-10-CM | POA: Diagnosis not present

## 2022-04-23 DIAGNOSIS — E785 Hyperlipidemia, unspecified: Secondary | ICD-10-CM | POA: Diagnosis present

## 2022-04-23 DIAGNOSIS — Z8249 Family history of ischemic heart disease and other diseases of the circulatory system: Secondary | ICD-10-CM | POA: Diagnosis not present

## 2022-04-23 DIAGNOSIS — Z803 Family history of malignant neoplasm of breast: Secondary | ICD-10-CM

## 2022-04-23 DIAGNOSIS — I272 Pulmonary hypertension, unspecified: Secondary | ICD-10-CM | POA: Diagnosis not present

## 2022-04-23 DIAGNOSIS — Z90710 Acquired absence of both cervix and uterus: Secondary | ICD-10-CM

## 2022-04-23 DIAGNOSIS — J9622 Acute and chronic respiratory failure with hypercapnia: Secondary | ICD-10-CM | POA: Diagnosis not present

## 2022-04-23 DIAGNOSIS — I10 Essential (primary) hypertension: Secondary | ICD-10-CM | POA: Diagnosis not present

## 2022-04-23 DIAGNOSIS — I2510 Atherosclerotic heart disease of native coronary artery without angina pectoris: Secondary | ICD-10-CM | POA: Diagnosis present

## 2022-04-23 DIAGNOSIS — K219 Gastro-esophageal reflux disease without esophagitis: Secondary | ICD-10-CM | POA: Diagnosis not present

## 2022-04-23 DIAGNOSIS — I4892 Unspecified atrial flutter: Secondary | ICD-10-CM | POA: Diagnosis not present

## 2022-04-23 DIAGNOSIS — J811 Chronic pulmonary edema: Secondary | ICD-10-CM | POA: Diagnosis not present

## 2022-04-23 DIAGNOSIS — Z6836 Body mass index (BMI) 36.0-36.9, adult: Secondary | ICD-10-CM

## 2022-04-23 DIAGNOSIS — Z7982 Long term (current) use of aspirin: Secondary | ICD-10-CM | POA: Diagnosis not present

## 2022-04-23 DIAGNOSIS — R0602 Shortness of breath: Secondary | ICD-10-CM | POA: Diagnosis not present

## 2022-04-23 DIAGNOSIS — Z801 Family history of malignant neoplasm of trachea, bronchus and lung: Secondary | ICD-10-CM

## 2022-04-23 DIAGNOSIS — F17210 Nicotine dependence, cigarettes, uncomplicated: Secondary | ICD-10-CM | POA: Diagnosis not present

## 2022-04-23 DIAGNOSIS — G4733 Obstructive sleep apnea (adult) (pediatric): Secondary | ICD-10-CM | POA: Diagnosis present

## 2022-04-23 DIAGNOSIS — J9611 Chronic respiratory failure with hypoxia: Secondary | ICD-10-CM

## 2022-04-23 DIAGNOSIS — E039 Hypothyroidism, unspecified: Secondary | ICD-10-CM | POA: Diagnosis not present

## 2022-04-23 DIAGNOSIS — J441 Chronic obstructive pulmonary disease with (acute) exacerbation: Secondary | ICD-10-CM

## 2022-04-23 DIAGNOSIS — R69 Illness, unspecified: Secondary | ICD-10-CM | POA: Diagnosis not present

## 2022-04-23 DIAGNOSIS — I270 Primary pulmonary hypertension: Secondary | ICD-10-CM | POA: Diagnosis not present

## 2022-04-23 DIAGNOSIS — Z8616 Personal history of COVID-19: Secondary | ICD-10-CM | POA: Diagnosis not present

## 2022-04-23 DIAGNOSIS — E669 Obesity, unspecified: Secondary | ICD-10-CM | POA: Diagnosis present

## 2022-04-23 DIAGNOSIS — I5030 Unspecified diastolic (congestive) heart failure: Secondary | ICD-10-CM | POA: Diagnosis not present

## 2022-04-23 DIAGNOSIS — M5137 Other intervertebral disc degeneration, lumbosacral region: Secondary | ICD-10-CM | POA: Diagnosis present

## 2022-04-23 LAB — HEPATIC FUNCTION PANEL
ALT: 23 U/L (ref 0–44)
AST: 21 U/L (ref 15–41)
Albumin: 3.7 g/dL (ref 3.5–5.0)
Alkaline Phosphatase: 62 U/L (ref 38–126)
Bilirubin, Direct: <0.1 mg/dL (ref 0.0–0.2)
Total Bilirubin: 0.6 mg/dL (ref 0.3–1.2)
Total Protein: 6.7 g/dL (ref 6.5–8.1)

## 2022-04-23 LAB — TROPONIN I (HIGH SENSITIVITY)
Troponin I (High Sensitivity): 12 ng/L (ref <18)
Troponin I (High Sensitivity): 15 ng/L (ref <18)

## 2022-04-23 LAB — BASIC METABOLIC PANEL
Anion gap: 8 (ref 5–15)
BUN: 18 mg/dL (ref 8–23)
CO2: 27 mmol/L (ref 22–32)
Calcium: 8.8 mg/dL — ABNORMAL LOW (ref 8.9–10.3)
Chloride: 105 mmol/L (ref 98–111)
Creatinine, Ser: 0.82 mg/dL (ref 0.44–1.00)
GFR, Estimated: >60 mL/min (ref >60)
Glucose, Bld: 142 mg/dL — ABNORMAL HIGH (ref 70–99)
Potassium: 3.9 mmol/L (ref 3.5–5.1)
Sodium: 140 mmol/L (ref 135–145)

## 2022-04-23 LAB — CBC
HCT: 45.2 % (ref 36.0–46.0)
Hemoglobin: 14.3 g/dL (ref 12.0–15.0)
MCH: 31.4 pg (ref 26.0–34.0)
MCHC: 31.6 g/dL (ref 30.0–36.0)
MCV: 99.1 fL (ref 80.0–100.0)
Platelets: 200 10*3/uL (ref 150–400)
RBC: 4.56 MIL/uL (ref 3.87–5.11)
RDW: 16.3 % — ABNORMAL HIGH (ref 11.5–15.5)
WBC: 11.2 10*3/uL — ABNORMAL HIGH (ref 4.0–10.5)
nRBC: 0 % (ref 0.0–0.2)

## 2022-04-23 LAB — T4, FREE: Free T4: 0.71 ng/dL (ref 0.61–1.12)

## 2022-04-23 LAB — TSH: TSH: 0.765 u[IU]/mL (ref 0.350–4.500)

## 2022-04-23 LAB — MAGNESIUM: Magnesium: 2.3 mg/dL (ref 1.7–2.4)

## 2022-04-23 LAB — LIPASE, BLOOD: Lipase: 48 U/L (ref 11–51)

## 2022-04-23 MED ORDER — MAGNESIUM SULFATE 2 GM/50ML IV SOLN
2.0000 g | INTRAVENOUS | Status: AC
  Administered 2022-04-23: 2 g via INTRAVENOUS
  Filled 2022-04-23: qty 50

## 2022-04-23 MED ORDER — SERTRALINE HCL 50 MG PO TABS
200.0000 mg | ORAL_TABLET | Freq: Every day | ORAL | Status: DC
  Administered 2022-04-24 – 2022-04-29 (×6): 200 mg via ORAL
  Filled 2022-04-23 (×6): qty 4

## 2022-04-23 MED ORDER — SIMVASTATIN 20 MG PO TABS
20.0000 mg | ORAL_TABLET | Freq: Every day | ORAL | Status: DC
  Administered 2022-04-24: 20 mg via ORAL
  Filled 2022-04-23: qty 1
  Filled 2022-04-23: qty 2

## 2022-04-23 MED ORDER — IPRATROPIUM-ALBUTEROL 0.5-2.5 (3) MG/3ML IN SOLN: 3.0000 mL | Freq: Four times a day (QID) | RESPIRATORY_TRACT | Status: DC | PRN

## 2022-04-23 MED ORDER — BUTALBITAL-APAP-CAFFEINE 50-325-40 MG PO TABS
1.0000 | ORAL_TABLET | Freq: Four times a day (QID) | ORAL | Status: DC | PRN
  Administered 2022-04-25 – 2022-04-29 (×7): 1 via ORAL
  Filled 2022-04-23 (×8): qty 1

## 2022-04-23 MED ORDER — IPRATROPIUM BROMIDE 0.02 % IN SOLN: 0.5000 mg | RESPIRATORY_TRACT | Status: DC | PRN

## 2022-04-23 MED ORDER — RIVAROXABAN 20 MG PO TABS
20.0000 mg | ORAL_TABLET | Freq: Every day | ORAL | Status: DC
  Administered 2022-04-23 – 2022-04-28 (×6): 20 mg via ORAL
  Filled 2022-04-23 (×8): qty 1

## 2022-04-23 MED ORDER — ASPIRIN 81 MG PO TBEC
81.0000 mg | DELAYED_RELEASE_TABLET | Freq: Every day | ORAL | Status: DC
  Administered 2022-04-24: 81 mg via ORAL
  Filled 2022-04-23: qty 1

## 2022-04-23 MED ORDER — ROFLUMILAST 500 MCG PO TABS
500.0000 ug | ORAL_TABLET | Freq: Every day | ORAL | Status: DC
  Administered 2022-04-24 – 2022-04-29 (×6): 500 ug via ORAL
  Filled 2022-04-23 (×6): qty 1

## 2022-04-23 MED ORDER — AMBRISENTAN 5 MG PO TABS: 5.0000 mg | ORAL_TABLET | Freq: Every day | ORAL | Status: DC

## 2022-04-23 MED ORDER — NICOTINE 21 MG/24HR TD PT24
21.0000 mg | MEDICATED_PATCH | Freq: Every day | TRANSDERMAL | Status: DC
  Administered 2022-04-24 – 2022-04-29 (×8): 21 mg via TRANSDERMAL
  Filled 2022-04-23 (×8): qty 1

## 2022-04-23 MED ORDER — AMBRISENTAN 5 MG PO TABS
5.0000 mg | ORAL_TABLET | Freq: Every day | ORAL | Status: DC
Start: 2022-04-24 — End: 2022-04-29
  Administered 2022-04-24 – 2022-04-29 (×6): 5 mg via ORAL
  Filled 2022-04-23 (×9): qty 1

## 2022-04-23 MED ORDER — BUTALBITAL-APAP-CAFFEINE 50-325-40 MG PO TABS
1.0000 | ORAL_TABLET | Freq: Once | ORAL | Status: AC
  Administered 2022-04-23: 1 via ORAL
  Filled 2022-04-23: qty 1

## 2022-04-23 MED ORDER — ACETAMINOPHEN 500 MG PO TABS
1000.0000 mg | ORAL_TABLET | Freq: Once | ORAL | Status: AC
  Administered 2022-04-23: 1000 mg via ORAL
  Filled 2022-04-23: qty 2

## 2022-04-23 MED ORDER — DM-GUAIFENESIN ER 30-600 MG PO TB12: 1.0000 | ORAL_TABLET | Freq: Two times a day (BID) | ORAL | Status: DC | PRN

## 2022-04-23 MED ORDER — METHOCARBAMOL 500 MG PO TABS: 500.0000 mg | ORAL_TABLET | Freq: Four times a day (QID) | ORAL | Status: DC | PRN

## 2022-04-23 MED ORDER — POTASSIUM CHLORIDE CRYS ER 20 MEQ PO TBCR
20.0000 meq | EXTENDED_RELEASE_TABLET | Freq: Two times a day (BID) | ORAL | Status: DC
  Administered 2022-04-24 (×2): 20 meq via ORAL
  Filled 2022-04-23 (×2): qty 1

## 2022-04-23 MED ORDER — DILTIAZEM HCL 25 MG/5ML IV SOLN
20.0000 mg | Freq: Once | INTRAVENOUS | Status: AC
  Administered 2022-04-23: 20 mg via INTRAVENOUS
  Filled 2022-04-23: qty 5

## 2022-04-23 MED ORDER — ALBUTEROL SULFATE HFA 108 (90 BASE) MCG/ACT IN AERS: 2.0000 | INHALATION_SPRAY | Freq: Four times a day (QID) | RESPIRATORY_TRACT | Status: DC | PRN

## 2022-04-23 MED ORDER — ONDANSETRON HCL 4 MG/2ML IJ SOLN: 4.0000 mg | Freq: Four times a day (QID) | INTRAMUSCULAR | Status: DC | PRN

## 2022-04-23 MED ORDER — LEVOTHYROXINE SODIUM 25 MCG PO TABS
25.0000 ug | ORAL_TABLET | Freq: Every day | ORAL | Status: DC
  Administered 2022-04-24 – 2022-04-28 (×5): 25 ug via ORAL
  Filled 2022-04-23 (×5): qty 1

## 2022-04-23 MED ORDER — FLUTICASONE-UMECLIDIN-VILANT 100-62.5-25 MCG/INH IN AEPB: 1.0000 | INHALATION_SPRAY | Freq: Every day | RESPIRATORY_TRACT | Status: DC

## 2022-04-23 MED ORDER — VITAMIN D 25 MCG (1000 UNIT) PO TABS
2000.0000 [IU] | ORAL_TABLET | Freq: Every day | ORAL | Status: DC
  Administered 2022-04-24 – 2022-04-29 (×6): 2000 [IU] via ORAL
  Filled 2022-04-23 (×6): qty 2

## 2022-04-23 MED ORDER — SPIRONOLACTONE 25 MG PO TABS
50.0000 mg | ORAL_TABLET | Freq: Every day | ORAL | Status: DC
  Administered 2022-04-24: 50 mg via ORAL
  Filled 2022-04-23: qty 2

## 2022-04-23 MED ORDER — TORSEMIDE 20 MG PO TABS
40.0000 mg | ORAL_TABLET | Freq: Two times a day (BID) | ORAL | Status: DC
  Administered 2022-04-24 (×3): 40 mg via ORAL
  Filled 2022-04-23 (×3): qty 2

## 2022-04-23 MED ORDER — DILTIAZEM HCL-DEXTROSE 125-5 MG/125ML-% IV SOLN (PREMIX)
5.0000 mg/h | INTRAVENOUS | Status: DC
  Administered 2022-04-23: 5 mg/h via INTRAVENOUS
  Filled 2022-04-23: qty 125

## 2022-04-23 MED ORDER — ACETAMINOPHEN 325 MG PO TABS: 650.0000 mg | ORAL_TABLET | ORAL | Status: DC | PRN

## 2022-04-23 NOTE — Progress Notes (Signed)
Pharmacy Consult for Pulmonary Hypertension Treatment   Indication - Continuation of prior to admission medication   Patient is 65 y.o.  with history of PAH on chronic Ambrisentan (Letairis) PTA and will be continued while hospitalized.   Continuing this medication order as an inpatient requires that monitoring parameters per REMS requirements must be met.  Chronic therapy is under the supervision of Shayan Anwar, DO (MD name) who is enrolled in the REMS program and is being notified of continuation of therapy. A staff message in EPIC has been sent notifying the certified prescriber.  Per patient report has previously been educated on Hepatotoxicity . On admission pregnancy risk has been assessed and no monitoring required. Hepatic function has been evaluated. AST/ALT appropriate to continue medication at this time.     Latest Ref Rng & Units 04/23/2022    5:21 PM 04/01/2022    8:03 AM 08/20/2021    1:41 PM  Hepatic Function  Total Protein 6.5 - 8.1 g/dL 6.7  7.2    Albumin 3.5 - 5.0 g/dL 3.7  3.7  4.3   AST 15 - 41 U/L 21  20    ALT 0 - 44 U/L 23  21    Alk Phosphatase 38 - 126 U/L 62  74    Total Bilirubin 0.3 - 1.2 mg/dL 0.6  0.4    Bilirubin, Direct 0.0 - 0.2 mg/dL <0.1       If any question arise or pregnancy is identified during hospitalization, contact for bosentan and macitentan: [phone]; ambrisentan: [phone].  Thank for you allowing us to participate in the care of this patient.  

## 2022-04-23 NOTE — ED Triage Notes (Signed)
Patient to ER via POV, reports that she has been monitoring her heart rate at home. This morning, she checked her HR via her cardiac monitor and she was contacted by the provider that she needed to be seen in the ER. Reports that her HR was reaching 160 while resting. Denies any CP, reports some discomfort.    Patient on 3L continous oxygen.

## 2022-04-23 NOTE — ED Provider Notes (Signed)
[facility] Provider Note    Event Date/Time   First MD Initiated Contact with Patient 04/23/22 1636     (approximate)   History   Chief Complaint: Tachycardia   HPI  Theresa [name] is a 66 y.o. female with a history of CHF, COPD, chronic respiratory failure on home oxygen, obesity who comes the ED complaining of tachycardia.  She reports some chest discomfort which is vague and nonlocalized.  No aggravating or alleviating factors.  Some increase in her chronic shortness of breath as well.  No cough or fever.  She was not aware specifically that she was having a fast heart rate, but she did a remote monitoring vital sign check this morning and was contacted to be seen right away.  She is not sure of onset.      Physical Exam   Triage Vital Signs: ED Triage Vitals  Enc Vitals Group     BP 04/23/22 1620 116/66     Pulse Rate 04/23/22 1621 (!) 146     Resp 04/23/22 1620 17     Temp 04/23/22 1620 98 F (36.7 C)     Temp src --      SpO2 04/23/22 1620 96 %     Weight 04/23/22 1612 199 lb 12.8 oz (90.6 kg)     Height 04/23/22 1612 _0  (1.575 m)     Head Circumference --      Peak Flow --      Pain Score 04/23/22 1612 0     Pain Loc --      Pain Edu? --      Excl. in [place]? --     Most recent vital signs: Vitals:   04/23/22 2030 04/23/22 2100  BP: 125/86 100/67  Pulse: (!) 151 (!) 150  Resp: (!) 24 (!) 24  Temp:    SpO2: 92% 94%    General: Awake, no distress.  CV:  Good peripheral perfusion.  Tachycardia heart rate 150.  Symmetric distal pulses.   Resp:  Normal effort.  Symmetric air movement.  No crackles.  Faint expiratory wheezing diffusely. Abd:  No distention.  Soft nontender Other:  No lower extremity edema.   ED Results / Procedures / Treatments   Labs (all labs ordered are listed, but only abnormal results are displayed) Labs Reviewed  BASIC METABOLIC PANEL - Abnormal; Notable for the following components:      Result Value    Glucose, Bld 142 (*)    Calcium 8.8 (*)    All other components within normal limits  CBC - Abnormal; Notable for the following components:   WBC 11.2 (*)    RDW 16.3 (*)    All other components within normal limits  T4, FREE  TSH  MAGNESIUM  HEPATIC FUNCTION PANEL  LIPASE, BLOOD  CBC  TROPONIN I (HIGH SENSITIVITY)  TROPONIN I (HIGH SENSITIVITY)     EKG Interpreted by me Atrial flutter, rate of 155.  Normal axis, normal intervals.  Poor R wave progression.  Normal ST segments and T waves, no ischemic changes.   RADIOLOGY Chest x-ray interpreted by me, negative for pulmonary edema pleural effusion or pneumonia.  Radiology report reviewed.   PROCEDURES:  .1-3 Lead EKG Interpretation  Performed by: Carrie Mew, MD Authorized by: Carrie Mew, MD     Interpretation: abnormal     ECG rate:  154   ECG rate assessment: tachycardic     Rhythm: atrial flutter     Ectopy:  none     Conduction: normal   Comments:        .Critical Care  Performed by: Carrie Mew, MD Authorized by: Carrie Mew, MD   Critical care provider statement:    Critical care time (minutes):  35   Critical care time was exclusive of:  Separately billable procedures and treating other patients   Critical care was necessary to treat or prevent imminent or life-threatening deterioration of the following conditions:  Cardiac failure   Critical care was time spent personally by me on the following activities:  Development of treatment plan with patient or surrogate, discussions with consultants, evaluation of patient's response to treatment, examination of patient, obtaining history from patient or surrogate, ordering and performing treatments and interventions, ordering and review of laboratory studies, ordering and review of radiographic studies, pulse oximetry, re-evaluation of patient's condition and review of old charts   Care discussed with: admitting provider       [place] [facility]: Medications  diltiazem (CARDIZEM) 125 mg in dextrose 5% 125 mL (1 mg/mL) infusion (5 mg/hr Intravenous New Bag/Given 04/23/22 2115)  acetaminophen (TYLENOL) tablet 650 mg (has no administration in time range)  ondansetron (ZOFRAN) injection 4 mg (has no administration in time range)  rivaroxaban (XARELTO) tablet 20 mg (has no administration in time range)  butalbital-acetaminophen-caffeine (FIORICET) 50-325-40 MG per tablet 1 tablet (has no administration in time range)  diltiazem (CARDIZEM) injection 20 mg (20 mg Intravenous Given 04/23/22 1718)  magnesium sulfate IVPB 2 g 50 mL (0 g Intravenous Stopped 04/23/22 1835)  diltiazem (CARDIZEM) injection 20 mg (20 mg Intravenous Given 04/23/22 1914)  acetaminophen (TYLENOL) tablet 1,000 mg (1,000 mg Oral Given 04/23/22 1918)     IMPRESSION / MDM / [place] / ED COURSE  I reviewed the triage vital signs and the nursing notes.                              Differential diagnosis includes, but is not limited to, electrolyte abnormality, hyperthyroidism, anemia, non-STEMI, AKI  Patient's presentation is most consistent with acute presentation with potential threat to life or bodily function.  Patient presents with new onset of atrial flutter.  She is not anticoagulated, onset is not certain.  We will give diltiazem bolus, IV magnesium bolus, check labs   Clinical Course as of 04/23/22 2141  Sat Apr 23, 2022  1920 Still in atrial flutter, hr 150 on monitor. Will repeat diltiazem bolus to attempt rate/rhythm control.  [PS]    Clinical Course User Index [PS] Carrie Mew, MD     ----------------------------------------- 8:38 PM on 04/23/2022 ----------------------------------------- Still no response to repeated dose of diltiazem.  We will need to hospitalize for further attempts at rate control and cardiology management.  ----------------------------------------- 9:41 PM on  04/23/2022 ----------------------------------------- Case discussed with hospitalist.  FINAL CLINICAL IMPRESSION(S) / ED DIAGNOSES   Final diagnoses:  New onset atrial flutter ([place])  Chronic respiratory failure with hypoxia ([place])  Chronic congestive heart failure, unspecified heart failure type [facility])     Rx / DC Orders   ED Discharge Orders          Ordered    Amb referral to [facility]        04/23/22 2124             Note:  This document was prepared using Dragon voice recognition software and may include unintentional dictation errors.   Joni Fears,  Doren Custard, MD 04/23/22 2141

## 2022-04-23 NOTE — H&P (Addendum)
H&P:    Theresa [name]   IXV:[id number] DOB: 01-15-1956 DOA: 04/23/2022  PCP: Birdie Sons, MD  Chief Complaint: chest pressure, SOB, fatigue   History of Present Illness:    HPI: Theresa [name] is a 66 y.o. female with a past medical history of dCHF, hypertension, COPD on 2.5 L, GERD, hypothyroidism, depression/anxiety, tobacco dependence currently smokes 1/2 PPD, pulmonary hypertension, OSA on BiPAP, chronic pain syndrome.  This patient presents to the emergency department with nausea, clamminess, shortness of breath, increased fatigue and chest pressure since yesterday.  Occurred while at [facility].  Went home and layed in bed all day.  Had trouble staying asleep.  Also noted increased anxiety.  Husband present at bedside.  ED Course: Labs checked which are unremarkable including normal TSH/FT4, magnesium, troponins. EKG showed aflutter with HR in the 150s. Given 2 boluses of cardizem and now on cardizem gtt. EDP discussed case with Dr. Alois Cliche and they will see the patient in consultation.   ROS:   13 point review of systems is negative except for what is mentioned above in the HPI.   Past Medical History:   Past Medical History:  Diagnosis Date   Acute postoperative pain 10/03/2017   Anxiety    BiPAP (biphasic positive airway pressure) dependence    at hs   Carpal tunnel syndrome    CHF (congestive heart failure) ([place])    1/18   CHF (congestive heart failure) (HCC)    Chronic generalized abdominal pain    Chronic rhinitis    COPD (chronic obstructive pulmonary disease) (HCC)    DDD (degenerative disc disease), cervical    DDD (degenerative disc disease), lumbosacral    Depression    Dyspnea    Edema    Flu    1/18   Gastritis    GERD (gastroesophageal reflux disease)    Hematuria    Hernia of abdominal wall 07/17/2018   Hernia, abdominal    Hypertension    Kidney stones    Low back pain    Lumbar radiculitis    Malodorous urine    Muscle weakness     Obesity    Oxygen dependent    Pneumonia due to COVID-19 virus 02/03/2020   Renal cyst    Sensory urge incontinence    Thyroid activity decreased    9/19   Tobacco abuse    Wheezing     Past Surgical History:   Past Surgical History:  Procedure Laterality Date   ABDOMINAL HYSTERECTOMY     CARPAL TUNNEL RELEASE Left 2012   EPIGASTRIC HERNIA REPAIR N/A 08/13/2018   HERNIA REPAIR EPIGASTRIC ADULT; polypropylene mesh reinforcement.  Surgeon: Robert Bellow, MD;  Location: [facility] ORS;  Service: General;  Laterality: N/A;   RIGHT/LEFT HEART CATH AND CORONARY ANGIOGRAPHY N/A 07/11/2019   Procedure: RIGHT/LEFT HEART CATH AND CORONARY ANGIOGRAPHY;  Surgeon: Yolonda Kida, MD;  Location: [facility];  Service: Cardiovascular;  Laterality: N/A;   TONSILLECTOMY     TUBAL LIGATION     UMBILICAL HERNIA REPAIR N/A 08/13/2018   HERNIA REPAIR UMBILICAL ADULT; polypropylene mesh reinforcement Surgeon: Robert Bellow, MD;  Location: [facility] ORS;  Service: General;  Laterality: N/A;    Social History:   Social History   Socioeconomic History   Marital status: Married    Spouse name: Ovid Curd   Number of children: 2   Years of education: Not on file   Highest education level: 12th grade  Occupational History  Occupation: disability  Tobacco Use   Smoking status: Every Day    Packs/day: 0.50    Years: 44.00    Total pack years: 22.00    Types: Cigarettes   Smokeless tobacco: Never   Tobacco comments:    over a half pack  Vaping Use   Vaping Use: Never used  Substance and Sexual Activity   Alcohol use: No   Drug use: Yes    Comment: prescribed hydrocodone   Sexual activity: Not Currently    Birth control/protection: Surgical  Other Topics Concern   Not on file  Social History Narrative   ** Merged History Encounter **       Lives with spouse    Social Determinants of Health   Financial Resource Strain: Low Risk  (09/20/2021)   Overall Financial Resource Strain  (CARDIA)    Difficulty of Paying Living Expenses: Not very hard  Food Insecurity: No Food Insecurity (12/14/2021)   Hunger Vital Sign    Worried About Running Out of Food in the Last Year: Never true    Ran Out of Food in the Last Year: Never true  Transportation Needs: No Transportation Needs (12/14/2021)   PRAPARE - Transportation    Lack of Transportation (Medical): No    Lack of Transportation (Non-Medical): No  Physical Activity: Inactive (09/20/2021)   Exercise Vital Sign    Days of Exercise per Week: 0 days    Minutes of Exercise per Session: 0 min  Stress: No Stress Concern Present (09/20/2021)   [place]    Feeling of Stress : Not at all  Social Connections: Moderately Integrated (09/20/2021)   Social Connection and Isolation Panel [NHANES]    Frequency of Communication with Friends and Family: More than three times a week    Frequency of Social Gatherings with Friends and Family: Once a week    Attends Religious Services: More than 4 times per year    Active Member of [employer] or Organizations: No    Attends Archivist Meetings: Never    Marital Status: Married  Human resources officer Violence: Not At Risk (01/22/2020)   Humiliation, Afraid, Rape, and Kick questionnaire    Fear of Current or Ex-Partner: No    Emotionally Abused: No    Physically Abused: No    Sexually Abused: No    Allergies:   Allergies  Allergen Reactions   Gabapentin Other (See Comments)    Higher doses of 800 mg, 600 mg, 300 mg causes excessive sedation and contributes to numerous mechanical falls. (Unsure if 100 mg dose can be tolerated)   Codeine Nausea And Vomiting   Meloxicam Other (See Comments)    Stomach pain    Family History:   Family History  Problem Relation Age of Onset   Heart disease Mother    Stroke Mother    Coronary artery disease Mother    Lung cancer Sister    Cancer Sister    Breast cancer Sister     Alcohol abuse Father    Heart disease Father    Cancer Brother    Cancer Brother    Pneumonia Brother    Prostate cancer Neg Hx    Kidney cancer Neg Hx    Bladder Cancer Neg Hx      Current Medications:   Prior to Admission medications   Medication Sig Start Date End Date Taking? Authorizing Provider  acetaminophen (TYLENOL) 325 MG tablet Take 1 tablet (325 mg  total) by mouth every 6 (six) hours as needed for mild pain or fever. 04/09/22   Emeterio Reeve, DO  albuterol (VENTOLIN HFA) 108 (90 Base) MCG/ACT inhaler Inhale 2 puffs into the lungs every 6 (six) hours as needed for wheezing or shortness of breath. 10/23/19   Rudene Re, MD  ambrisentan (LETAIRIS) 5 MG tablet Take 5 mg by mouth daily. 01/13/22   [provider]  aspirin EC 81 MG tablet Take 81 mg by mouth daily.    [provider]  Besifloxacin HCl (BESIVANCE) 0.6 % SUSP Place 1 drop into the right eye 3 (three) times daily. For 2 weeks starting 03/29/22    [provider]  Bromfenac Sodium (PROLENSA) 0.07 % SOLN Place 1 drop into the right eye at bedtime. For 4 weeks starting 03/29/22    [provider]  butalbital-acetaminophen-caffeine (FIORICET) 50-325-40 MG tablet Take 1 tablet by mouth every 6 (six) hours as needed for headache. LIMIT USE TO PREVENT MEDICATION OVERUSE HEADACHE 04/09/22   Emeterio Reeve, DO  Cholecalciferol 50 MCG (2000 UT) CAPS Take 2,000 Units by mouth daily.     [provider]  dextromethorphan-guaiFENesin (MUCINEX DM) 30-600 MG 12hr tablet Take 1 tablet by mouth 2 (two) times daily as needed for cough. 04/09/22   Emeterio Reeve, DO  Difluprednate 0.05 % EMUL Place 1 drop into the right eye in the morning, at noon, in the evening, and at bedtime. One drop 4 times daily for one week starting 03/29/22. One drop 3 times daily for one week. One drop 2 times daily for one week. One drop once daily for one week, then stop.    [provider]   Fluticasone-Umeclidin-Vilant 100-62.5-25 MCG/INH AEPB Inhale 1 puff into the lungs daily. 10/03/18   [provider]  HYDROcodone-acetaminophen (NORCO/VICODIN) 5-325 MG tablet Take 1 tablet by mouth every 8 (eight) hours as needed for severe pain. Must last 30 days 05/01/22 05/31/22  Milinda Pointer, MD  HYDROcodone-acetaminophen (NORCO/VICODIN) 5-325 MG tablet Take 1 tablet by mouth every 8 (eight) hours as needed for severe pain. Must last 30 days 05/31/22 06/30/22  Milinda Pointer, MD  HYDROcodone-acetaminophen (NORCO/VICODIN) 5-325 MG tablet Take 1 tablet by mouth every 8 (eight) hours as needed for severe pain. Must last 30 days 06/30/22 07/30/22  Milinda Pointer, MD  ipratropium-albuterol (DUONEB) 0.5-2.5 (3) MG/3ML SOLN USE 1 VIAL VIA NEBULIZER EVERY 6 HOURS AS NEEDED FOR SHORTNESS OF BREATH OR WHEEZING 11/30/20   Chrismon, Vickki Muff, PA-C  levothyroxine (SYNTHROID) 25 MCG tablet TAKE 1 TABLET(25 MCG) BY MOUTH DAILY BEFORE BREAKFAST 01/03/22   Birdie Sons, MD  nicotine (NICODERM CQ - DOSED IN MG/24 HOURS) 21 mg/24hr patch Place 1 patch (21 mg total) onto the skin daily. 04/10/22   Emeterio Reeve, DO  OXYGEN Inhale 2.5 L/min into the lungs.    [provider]  potassium chloride SA (KLOR-CON M) 20 MEQ tablet Take 20 mEq by mouth 2 (two) times daily. 04/11/22   [provider]  roflumilast (DALIRESP) 500 MCG TABS tablet Take 500 mcg by mouth daily.    [provider]  sertraline (ZOLOFT) 100 MG tablet TAKE 2 TABLETS BY MOUTH ONCE EVERY MORNING 04/12/22   Birdie Sons, MD  simvastatin (ZOCOR) 20 MG tablet Take 20 mg by mouth at bedtime. 04/11/22   [provider]  spironolactone (ALDACTONE) 50 MG tablet Take 1 tablet (50 mg total) by mouth daily. 04/10/22   Emeterio Reeve, DO  tolterodine (DETROL LA) 2  MG 24 hr capsule TAKE 1 CAPSULE BY MOUTH ONCE DAILY 04/17/22   Harlin Heys, MD  torsemide (DEMADEX) 20 MG tablet Take 2 tablets (40 mg  total) by mouth 2 (two) times daily. Take 4 tablets QAM and 2 tablets QPM 04/09/22   Emeterio Reeve, DO     Physical Exam:   Vitals:   04/23/22 1930 04/23/22 2000 04/23/22 2030 04/23/22 2100  BP: 117/85 (!) 129/104 125/86 100/67  Pulse: (!) 152 (!) 150 (!) 151 (!) 150  Resp: (!) 32 (!) 24 (!) 24 (!) 24  Temp:      SpO2: 93% (!) 89% 92% 94%  Weight:      Height:         General:  Appears calm and comfortable and is in NAD Cardiovascular: Irregular rhythm, irregular rate Respiratory:   Mild expiratory wheezing Abdomen:  soft, NT, ND, NABS Skin:  no rash or induration seen on limited exam Musculoskeletal:  grossly normal tone BUE/BLE, good ROM, no bony abnormality Lower extremity:  No LE edema.  Limited foot exam with no ulcerations.  2+ distal pulses. Psychiatric:  grossly normal mood and affect, speech fluent and appropriate, AOx3 Neurologic:  CN 2-12 grossly intact, moves all extremities in coordinated fashion, sensation intact    Data Review:    Radiological Exams on Admission: Independently reviewed - see discussion in A/P where applicable  DG Chest 2 View  Result Date: 04/23/2022 CLINICAL DATA:  Chest pain EXAM: CHEST - 2 VIEW COMPARISON:  04/05/2022 FINDINGS: Cardiomegaly. Both lungs are clear. Emphysema. Disc degenerative disease of the thoracic spine. IMPRESSION: 1.  Cardiomegaly without acute abnormality of the lungs. 2.  Emphysema. Electronically Signed   By: Delanna Ahmadi M.D.   On: 04/23/2022 16:43    EKG: Independently reviewed.  A flutter with heart rate 150   Labs on Admission: I have personally reviewed the available labs and imaging studies at the time of the admission.  Pertinent labs on Admission: None     Assessment/Plan:   A flutter: With a rapid ventricular response.  New onset.  Currently on diltiazem drip.  Cardiology consulted.  Discussed case with them if patient becomes hypotensive they recommend starting amiodarone drip.  If maxed out on  Cardizem drip and blood pressure stable they will plan to proceed with TEE/DCCV.  CHA2DS2-VASc score of 4.  Start Xarelto 20 mg daily.  TSH, mag, potassium, troponins unremarkable. Recent echo 03/2022 with no valvular abnormalities noted.  LVEF 55 to 60% with grade 1 diastolic dysfunction.  No regional wall motion abnormalities.  COPD: Continue home Trelegy Ellipta, Daliresp. Not in acute exacerbation. On 2.5L at baseline  OSA: Continue home BiPAP QHS  Depression/anxiety: continue home Zoloft  Hyperlipidemia: continue home Zocor  dCHF: not in acute decompensation. Continue home torsemide, aldactone  Hypothyroidism: continue home levothyroxine  Tobacco dependence: counseled on smoking cessation  pHTN: continue home ambrisentan    Other information:    Level of Care: Progressive care DVT prophylaxis: Xarelto Code Status: Full code Consults: Cardiology Admission status: Inpatient   Leslee Home DO [facility]   How to contact the [facility] Attending or Consulting provider [place] or covering provider during after hours [place], for this patient?  Check the care team in [facility] and look for a) attending/consulting TRH provider listed and b) the [facility] team listed Log into [email or web address] and use [place]'s universal password to access. If you do not have the password, please contact the hospital operator.  Locate the [facility] provider you are looking for under [facility] and page to a number that you can be directly reached. If you still have difficulty reaching the provider, please page the [facility] (Director on Call) for the Hospitalists listed on amion for assistance.   04/23/2022, 9:27 PM

## 2022-04-24 ENCOUNTER — Other Ambulatory Visit: Payer: Self-pay

## 2022-04-24 ENCOUNTER — Inpatient Hospital Stay: Payer: Medicare HMO

## 2022-04-24 ENCOUNTER — Other Ambulatory Visit: Payer: Self-pay | Admitting: Family Medicine

## 2022-04-24 DIAGNOSIS — J441 Chronic obstructive pulmonary disease with (acute) exacerbation: Secondary | ICD-10-CM | POA: Diagnosis not present

## 2022-04-24 DIAGNOSIS — I480 Paroxysmal atrial fibrillation: Secondary | ICD-10-CM

## 2022-04-24 DIAGNOSIS — E039 Hypothyroidism, unspecified: Secondary | ICD-10-CM

## 2022-04-24 DIAGNOSIS — I2721 Secondary pulmonary arterial hypertension: Secondary | ICD-10-CM | POA: Diagnosis not present

## 2022-04-24 DIAGNOSIS — I5032 Chronic diastolic (congestive) heart failure: Secondary | ICD-10-CM | POA: Diagnosis not present

## 2022-04-24 DIAGNOSIS — F32A Depression, unspecified: Secondary | ICD-10-CM

## 2022-04-24 LAB — HEPATIC FUNCTION PANEL
ALT: 24 U/L (ref 0–44)
AST: 23 U/L (ref 15–41)
Albumin: 3.8 g/dL (ref 3.5–5.0)
Alkaline Phosphatase: 66 U/L (ref 38–126)
Bilirubin, Direct: <0.1 mg/dL (ref 0.0–0.2)
Total Bilirubin: 0.5 mg/dL (ref 0.3–1.2)
Total Protein: 6.8 g/dL (ref 6.5–8.1)

## 2022-04-24 LAB — CBC
HCT: 40.8 % (ref 36.0–46.0)
Hemoglobin: 13 g/dL (ref 12.0–15.0)
MCH: 31.6 pg (ref 26.0–34.0)
MCHC: 31.9 g/dL (ref 30.0–36.0)
MCV: 99.3 fL (ref 80.0–100.0)
Platelets: 172 10*3/uL (ref 150–400)
RBC: 4.11 MIL/uL (ref 3.87–5.11)
RDW: 16.5 % — ABNORMAL HIGH (ref 11.5–15.5)
WBC: 6.8 10*3/uL (ref 4.0–10.5)
nRBC: 0 % (ref 0.0–0.2)

## 2022-04-24 LAB — BRAIN NATRIURETIC PEPTIDE: B Natriuretic Peptide: 94.6 pg/mL (ref 0.0–100.0)

## 2022-04-24 LAB — GLUCOSE, CAPILLARY: Glucose-Capillary: 215 mg/dL — ABNORMAL HIGH (ref 70–99)

## 2022-04-24 LAB — MRSA NEXT GEN BY PCR, NASAL: MRSA by PCR Next Gen: NOT DETECTED

## 2022-04-24 MED ORDER — ORAL CARE MOUTH RINSE: 15.0000 mL | OROMUCOSAL | Status: DC | PRN

## 2022-04-24 MED ORDER — AMIODARONE LOAD VIA INFUSION
150.0000 mg | Freq: Once | INTRAVENOUS | Status: AC
  Administered 2022-04-24: 150 mg via INTRAVENOUS
  Filled 2022-04-24: qty 83.34

## 2022-04-24 MED ORDER — SPIRONOLACTONE 25 MG PO TABS: 12.5000 mg | ORAL_TABLET | Freq: Every day | ORAL | Status: DC

## 2022-04-24 MED ORDER — ALBUTEROL SULFATE (2.5 MG/3ML) 0.083% IN NEBU
2.5000 mg | INHALATION_SOLUTION | Freq: Four times a day (QID) | RESPIRATORY_TRACT | Status: DC
  Administered 2022-04-24 (×2): 2.5 mg via RESPIRATORY_TRACT
  Filled 2022-04-24 (×2): qty 3

## 2022-04-24 MED ORDER — IPRATROPIUM-ALBUTEROL 0.5-2.5 (3) MG/3ML IN SOLN
3.0000 mL | Freq: Four times a day (QID) | RESPIRATORY_TRACT | Status: DC
  Administered 2022-04-24 – 2022-04-25 (×5): 3 mL via RESPIRATORY_TRACT
  Filled 2022-04-24 (×6): qty 3

## 2022-04-24 MED ORDER — BUDESONIDE 0.5 MG/2ML IN SUSP: 0.5000 mg | Freq: Two times a day (BID) | RESPIRATORY_TRACT | Status: DC

## 2022-04-24 MED ORDER — DILTIAZEM HCL ER COATED BEADS 120 MG PO CP24
120.0000 mg | ORAL_CAPSULE | Freq: Every day | ORAL | Status: DC
  Administered 2022-04-24: 120 mg via ORAL
  Filled 2022-04-24: qty 1

## 2022-04-24 MED ORDER — HYDROCODONE-ACETAMINOPHEN 5-325 MG PO TABS
1.0000 | ORAL_TABLET | Freq: Three times a day (TID) | ORAL | Status: DC | PRN
  Administered 2022-04-24 – 2022-04-28 (×6): 1 via ORAL
  Filled 2022-04-24 (×6): qty 1

## 2022-04-24 MED ORDER — BUDESONIDE 0.5 MG/2ML IN SUSP
0.5000 mg | Freq: Two times a day (BID) | RESPIRATORY_TRACT | Status: DC
  Administered 2022-04-24 – 2022-04-25 (×2): 0.5 mg via RESPIRATORY_TRACT
  Filled 2022-04-24 (×2): qty 2

## 2022-04-24 MED ORDER — FLUTICASONE FUROATE-VILANTEROL 100-25 MCG/ACT IN AEPB
1.0000 | INHALATION_SPRAY | Freq: Every day | RESPIRATORY_TRACT | Status: DC
  Administered 2022-04-24: 1 via RESPIRATORY_TRACT
  Filled 2022-04-24 (×2): qty 28

## 2022-04-24 MED ORDER — AMIODARONE HCL IN DEXTROSE 360-4.14 MG/200ML-% IV SOLN
60.0000 mg/h | INTRAVENOUS | Status: DC
  Administered 2022-04-24 (×2): 60 mg/h via INTRAVENOUS
  Filled 2022-04-24 (×2): qty 200

## 2022-04-24 MED ORDER — AMIODARONE HCL IN DEXTROSE 360-4.14 MG/200ML-% IV SOLN
30.0000 mg/h | INTRAVENOUS | Status: DC
  Administered 2022-04-25: 30 mg/h via INTRAVENOUS
  Filled 2022-04-24: qty 200

## 2022-04-24 MED ORDER — METHYLPREDNISOLONE SODIUM SUCC 125 MG IJ SOLR
80.0000 mg | Freq: Every day | INTRAMUSCULAR | Status: DC
  Administered 2022-04-24 – 2022-04-25 (×2): 80 mg via INTRAVENOUS
  Filled 2022-04-24 (×2): qty 2

## 2022-04-24 MED ORDER — DILTIAZEM HCL ER COATED BEADS 120 MG PO CP24
120.0000 mg | ORAL_CAPSULE | Freq: Every day | ORAL | Status: DC
  Filled 2022-04-24: qty 1

## 2022-04-24 MED ORDER — CHLORHEXIDINE GLUCONATE CLOTH 2 % EX PADS
6.0000 | MEDICATED_PAD | Freq: Every day | CUTANEOUS | Status: DC
  Administered 2022-04-24 – 2022-04-29 (×6): 6 via TOPICAL
  Filled 2022-04-24: qty 6

## 2022-04-24 MED ORDER — TIOTROPIUM BROMIDE MONOHYDRATE 18 MCG IN CAPS
18.0000 ug | ORAL_CAPSULE | Freq: Every day | RESPIRATORY_TRACT | Status: DC
  Administered 2022-04-24: 18 ug via RESPIRATORY_TRACT
  Filled 2022-04-24: qty 5

## 2022-04-24 MED ORDER — POTASSIUM CHLORIDE CRYS ER 20 MEQ PO TBCR
20.0000 meq | EXTENDED_RELEASE_TABLET | Freq: Two times a day (BID) | ORAL | Status: DC
  Administered 2022-04-24 – 2022-04-29 (×10): 20 meq via ORAL
  Filled 2022-04-24 (×10): qty 1

## 2022-04-24 NOTE — Progress Notes (Addendum)
Left forearm PIV infiltrated with Amiodarone infusing.  PIV removed.  IV team and pharmacy consulted, RN was advised to elevate and apply warm compress to site.   IV team also advised per pharmacy, can also do hyaluronidase intradermally if the infiltration is significant. IV team really recommends that Amiodarone go through a central line to prevent infiltrations and tissue sloughing.

## 2022-04-24 NOTE — ED Notes (Signed)
BiPAP removed at patient request

## 2022-04-24 NOTE — Assessment & Plan Note (Signed)
Continue Zoloft

## 2022-04-24 NOTE — Assessment & Plan Note (Signed)
BMI of 36.54

## 2022-04-24 NOTE — Assessment & Plan Note (Signed)
Continue Ambrisentan

## 2022-04-24 NOTE — ED Notes (Signed)
RN to assess pt. Pt reports feeling bad, O2 dropped to 84%, HR in 357S and BP 88 systolic. MD paged and responding to bedside.

## 2022-04-24 NOTE — Assessment & Plan Note (Signed)
This morning the patient was back in atrial flutter.  Amiodarone drip was held overnight secondary to hypotension.  Amiodarone restarted this morning.  Heart rate still elevated this afternoon.  Continue on Xarelto for anticoagulation.

## 2022-04-24 NOTE — Assessment & Plan Note (Signed)
Continue inhalers.  Start Solu-Medrol 80 mg IV daily.  Start albuterol nebulizer

## 2022-04-24 NOTE — Progress Notes (Signed)
  Progress Note   Patient: Theresa [name] [id number]:[id number] DOB: 1956/09/12 DOA: 04/23/2022     1 DOS: the patient was seen and examined on 04/24/2022    Assessment and Plan: * Paroxysmal atrial fibrillation with RVR ([place]) Patient was started on Xarelto for anticoagulation.  Benefits and risks of medication explained to patient.  Cardizem CD was started this morning.  Patient off Cardizem drip since last night with relative hypotension.  COPD with acute exacerbation (HCC) Continue inhalers.  Start Solu-Medrol 80 mg IV daily.  Start albuterol nebulizer  Chronic diastolic CHF (congestive heart failure) (HCC) Continue oral torsemide and lower the dose of Aldactone  Pulmonary arterial hypertension (HCC) Continue Ambrisentan  Hypothyroidism, unspecified Continue levothyroxine  Depression Continue Zoloft  Obesity (BMI 30-39.9) BMI of 36.54        Subjective: Patient had some wheezing and some shortness of breath..  Came in with rapid atrial flutter.  Converted over to atrial fibrillation.  Blood pressure little low overnight.  Physical Exam: Vitals:   04/24/22 1030 04/24/22 1100 04/24/22 1130 04/24/22 1213  BP: 100/77 100/71 94/75 95/65  Pulse: (!) 110 (!) 113 97 (!) 114  Resp: (!) 22 (!) 25 (!) 22 (!) 24  Temp:      SpO2: 97% 91% 97% 95%  Weight:      Height:       Physical Exam HENT:     Head: Normocephalic.     Mouth/Throat:     Pharynx: No oropharyngeal exudate.  Eyes:     General: Lids are normal.     Conjunctiva/sclera: Conjunctivae normal.  Cardiovascular:     Rate and Rhythm: Tachycardia present. Rhythm irregularly irregular.     Heart sounds: S1 normal and S2 normal.  Pulmonary:     Breath sounds: Examination of the right-middle field reveals decreased breath sounds and wheezing. Examination of the left-middle field reveals decreased breath sounds and wheezing. Examination of the right-lower field reveals decreased breath sounds and wheezing. Examination of the  left-lower field reveals decreased breath sounds and wheezing. Decreased breath sounds and wheezing present. No rhonchi or rales.  Abdominal:     Palpations: Abdomen is soft.     Tenderness: There is no abdominal tenderness.  Musculoskeletal:     Right ankle: Swelling present.     Left ankle: Swelling present.  Skin:    General: Skin is warm.     Findings: No rash.  Neurological:     Mental Status: She is alert and oriented to person, place, and time.     Data Reviewed: Creatinine 0.82, hemoglobin 13.0, platelet count 172  Family Communication: Spoke with husband on the phone  Disposition: Status is: Inpatient Remains inpatient appropriate because: Starting Solu-Medrol for COPD exacerbation.  Watching blood pressure closely with treatment for atrial fibrillation.  Planned Discharge Destination: Home    Time spent: 30 minutes Case discussed with cardiology  Author: Loletha Grayer, MD 04/24/2022 12:55 PM  For on call review [email or web address].

## 2022-04-24 NOTE — Assessment & Plan Note (Signed)
Holding parameters on torsemide.  Hold Aldactone with relative hypotension.

## 2022-04-24 NOTE — Assessment & Plan Note (Signed)
Continue levothyroxine 

## 2022-04-24 NOTE — Progress Notes (Signed)
Patient not feeling well.  Some tightness in her lower abdomen.  Short of breath.  Some more wheezing.  Heart rate up into the 120s.  Blood pressure on the lower side into the high 80s.  Lungs still very tight with bilateral wheezing.  We will step up care to stepdown unit.  We will get critical care consultation.  Case discussed with cardiology will start amiodarone drip.  We will repeat a chest x-ray.  With borderline saturations can use high flow nasal cannula or place on the BiPAP.  We will consult critical care specialist.  Dr. Loletha Grayer

## 2022-04-24 NOTE — Progress Notes (Unsigned)
{  Select_TRH_Note:[id number]}

## 2022-04-24 NOTE — Consult Note (Signed)
NAME:  Theresa [name], MRN:  [id number], DOB:  02-06-1956, LOS: 1 ADMISSION DATE:  04/23/2022, CONSULTATION DATE: 04/24/22 REFERRING MD: Dr. Leslye Peer, CHIEF COMPLAINT: Shortness of Breath   History of Present Illness:  This is a 66 yo female who presented to [name] ER on 07/8 with elevated heart rate.  According to the pt she checked her heart rate via a cardiac monitor at home that routes to her outpatient provider, and was instructed to proceed to urgent care or the ER for further evaluation due to heart rate reading between 150 to 160's.  She also endorsed some chest discomfort, but denied chest pain.  ED Course In the ER EKG revealed atrial flutter, heart rate 155.  CXR revealed emphysema but no acute abnormalities.  Pt received 1g of iv magnesium and 20 mg iv cardizem x1 dose with minimal improvement.  She subsequently received a 2nd dose of 20 mg iv cardizem, however heart rate remained elevated.  Cardizem gtt initiated per ED provider and cardiology consulted.  Pt admitted to the progressive care unit per hospitalist team for additional workup and treatment, however remained in the ER pending bed availability.  Hospital Course While in the ER on 07/9 pt developed soft bp's, therefore cardizem gtt discontinued and pt started on amiodarone gtt. Pt later developed acute on chronic hypoxic respiratory failure requiring HFNC suspected secondary to AECOPD and PCCM team consulted 07/9 to assist with management.   Pertinent  Medical History  Tobacco Abuse Obesity GERD DDD COPD (Bipap qhs) Chronic Diastolic CHF (Echo EF 55 to 51%; grade I diastolic dysfunction) Anxiety HTN COVID-19 Current Everyday Smoker  Lumbar Radiculitis   Significant Hospital Events: Including procedures, antibiotic start and stop dates in addition to other pertinent events   07/8: Pt admitted to the progressive care unit with atrial flutter/fibrillation requiring cardizem gtt but remained in the ER pending bed availability   07/9: Due to soft bp's cardizem gtt discontinued and amiodarone gtt initiated.  Pt developed acute on chronic hypoxic respiratory failure secondary to AECOPD requiring HFNC PCCM team consulted to assist with management.  Pt changed to stepdown status   Interim History / Subjective:  Pt c/o shortness of breath with some chest discomfort/tightness remains on 6L HFNC. Remains in atrial fibrillation heart rate mid to low 100's  Objective   Blood pressure 108/90, pulse (!) 108, temperature 98.4 F (36.9 C), temperature source Oral, resp. rate (!) 23, height _0  (1.575 m), weight 87.3 kg, SpO2 94 %.        Intake/Output Summary (Last 24 hours) at 04/24/2022 1548 Last data filed at 04/24/2022 1009 Gross per 24 hour  Intake --  Output 1500 ml  Net -1500 ml   Filed Weights   04/23/22 1612 04/24/22 1528  Weight: 90.6 kg 87.3 kg    Examination: General: Acutely ill appearing female, mild respiratory distress on HFNC  HENT: Supple, no JVD  Lungs: Diffuse wheezing throughout, mild tachypnea Cardiovascular: Irregular irregular, no r/g, 2+ radial/2+ distal pulses, no edema  Abdomen: +BS x4, obese, soft, non tender, non distended  Extremities: Normal bulk and tone, no edema  Neuro: Alert and oriented, follows commands  GU: Deferred   Resolved Hospital Problem list     Assessment & Plan:  Acute on chronic hypoxic respiratory failure secondary to AECOPD  Hx: Current everyday smoker and Bipap qhs  - Prn Bipap or supplemental O2 for dyspnea and/or hypoxia  - Bipap qhs  - Scheduled and prn bronchodilator therapy - IV and  nebulized steroids  - Smoking cessation counseling provided  - Continue nicotine patch   New onset atrial fibrillation/flutter  Moderate pulmonary HTN  Chronic diastolic CHF  - Continuous telemetry monitoring  - Continue amiodarone gtt for rate control - Cardiology consulted appreciate input  - Continue outpatient torsemide and simvastatin  - Continue xarelto  Best  Practice (right click and "Reselect all SmartList Selections" daily)   Diet/type: Regular consistency (see orders) DVT prophylaxis: Xarelto GI prophylaxis: N/A Lines: N/A Foley:  N/A Code Status:  full code Last date of multidisciplinary goals of care discussion [N/A]  Labs   CBC: Recent Labs  Lab 04/23/22 1619 04/24/22 0650  WBC 11.2* 6.8  HGB 14.3 13.0  HCT 45.2 40.8  MCV 99.1 99.3  PLT 200 160    Basic Metabolic Panel: Recent Labs  Lab 04/23/22 1619 04/23/22 1721  NA 140  --   K 3.9  --   CL 105  --   CO2 27  --   GLUCOSE 142*  --   BUN 18  --   CREATININE 0.82  --   CALCIUM 8.8*  --   MG  --  2.3   GFR: Estimated Creatinine Clearance: 71.1 mL/min (by C-G formula based on SCr of 0.82 mg/dL). Recent Labs  Lab 04/23/22 1619 04/24/22 0650  WBC 11.2* 6.8    Liver Function Tests: Recent Labs  Lab 04/23/22 1721 04/23/22 1814  AST 21 23  ALT 23 24  ALKPHOS 62 66  BILITOT 0.6 0.5  PROT 6.7 6.8  ALBUMIN 3.7 3.8   Recent Labs  Lab 04/23/22 1721  LIPASE 48   No results for input(s): "AMMONIA" in the last 168 hours.  ABG    Component Value Date/Time   PHART 7.43 04/08/2022 0912   PCO2ART 65 (H) 04/08/2022 0912   PO2ART 66 (L) 04/08/2022 0912   HCO3 43.1 (H) 04/08/2022 0912   TCO2 36 (H) 10/11/2019 2238   O2SAT 92.5 04/08/2022 0912     Coagulation Profile: No results for input(s): "INR", "PROTIME" in the last 168 hours.  Cardiac Enzymes: No results for input(s): "CKTOTAL", "CKMB", "CKMBINDEX", "TROPONINI" in the last 168 hours.  HbA1C: Hgb A1c MFr Bld  Date/Time Value Ref Range Status  04/08/2022 09:56 AM 5.7 (H) 4.8 - 5.6 % Final    Comment:    (NOTE) Pre diabetes:          5.7%-6.4%  Diabetes:              >6.4%  Glycemic control for   <7.0% adults with diabetes   02/04/2020 04:38 AM 6.4 (H) 4.8 - 5.6 % Final    Comment:    (NOTE) Pre diabetes:          5.7%-6.4% Diabetes:              >6.4% Glycemic control for    <7.0% adults with diabetes     CBG: No results for input(s): "GLUCAP" in the last 168 hours.  Review of Systems: Positives in BOLD   Gen: Denies fever, chills, weight change, fatigue, night sweats HEENT: Denies blurred vision, double vision, hearing loss, tinnitus, sinus congestion, rhinorrhea, sore throat, neck stiffness, dysphagia PULM: shortness of breath, cough, sputum production, hemoptysis, wheezing CV: chest discomfort, edema, orthopnea, paroxysmal nocturnal dyspnea, palpitations GI: Denies abdominal pain, nausea, vomiting, diarrhea, hematochezia, melena, constipation, change in bowel habits GU: Denies dysuria, hematuria, polyuria, oliguria, urethral discharge Endocrine: Denies hot or cold intolerance, polyuria, polyphagia or appetite change Derm:  Denies rash, dry skin, scaling or peeling skin change Heme: Denies easy bruising, bleeding, bleeding gums Neuro: Denies headache, numbness, weakness, slurred speech, loss of memory or consciousness  Past Medical History:  She,  has a past medical history of Acute postoperative pain (10/03/2017), Anxiety, BiPAP (biphasic positive airway pressure) dependence, Carpal tunnel syndrome, CHF (congestive heart failure) ([place]), CHF (congestive heart failure) ([place]), Chronic generalized abdominal pain, Chronic rhinitis, COPD (chronic obstructive pulmonary disease) ([place]), DDD (degenerative disc disease), cervical, DDD (degenerative disc disease), lumbosacral, Depression, Dyspnea, Edema, Flu, Gastritis, GERD (gastroesophageal reflux disease), Hematuria, Hernia of abdominal wall (07/17/2018), Hernia, abdominal, Hypertension, Kidney stones, Low back pain, Lumbar radiculitis, Malodorous urine, Muscle weakness, Obesity, Oxygen dependent, Pneumonia due to COVID-19 virus (02/03/2020), Renal cyst, Sensory urge incontinence, Thyroid activity decreased, Tobacco abuse, and Wheezing.   Surgical History:   Past Surgical History:  Procedure Laterality Date    ABDOMINAL HYSTERECTOMY     CARPAL TUNNEL RELEASE Left 2012   EPIGASTRIC HERNIA REPAIR N/A 08/13/2018   HERNIA REPAIR EPIGASTRIC ADULT; polypropylene mesh reinforcement.  Surgeon: Robert Bellow, MD;  Location: [facility];  Service: General;  Laterality: N/A;   RIGHT/LEFT HEART CATH AND CORONARY ANGIOGRAPHY N/A 07/11/2019   Procedure: RIGHT/LEFT HEART CATH AND CORONARY ANGIOGRAPHY;  Surgeon: Yolonda Kida, MD;  Location: [facility];  Service: Cardiovascular;  Laterality: N/A;   TONSILLECTOMY     TUBAL LIGATION     UMBILICAL HERNIA REPAIR N/A 08/13/2018   HERNIA REPAIR UMBILICAL ADULT; polypropylene mesh reinforcement Surgeon: Robert Bellow, MD;  Location: [facility];  Service: General;  Laterality: N/A;     Social History:   reports that she has been smoking cigarettes. She has a 22.00 pack-year smoking history. She has never used smokeless tobacco. She reports current drug use. She reports that she does not drink alcohol.   Family History:  Her family history includes Alcohol abuse in her father; Breast cancer in her sister; Cancer in her brother, brother, and sister; Coronary artery disease in her mother; Heart disease in her father and mother; Lung cancer in her sister; Pneumonia in her brother; Stroke in her mother. There is no history of Prostate cancer, Kidney cancer, or Bladder Cancer.   Allergies Allergies  Allergen Reactions   Gabapentin Other (See Comments)    Higher doses of 800 mg, 600 mg, 300 mg causes excessive sedation and contributes to numerous mechanical falls. (Unsure if 100 mg dose can be tolerated)   Codeine Nausea And Vomiting   Meloxicam Other (See Comments)    Stomach pain     Home Medications  Prior to Admission medications   Medication Sig Start Date End Date Taking? Authorizing Provider  acetaminophen (TYLENOL) 500 MG tablet Take 1,000 mg by mouth every 8 (eight) hours as needed.   Yes [provider]  albuterol (VENTOLIN HFA) 108  (90 Base) MCG/ACT inhaler Inhale 2 puffs into the lungs every 6 (six) hours as needed for wheezing or shortness of breath. 10/23/19  Yes Verones[place], [place], MD  ambrisentan (LETAIRIS) 5 MG tablet Take 5 mg by mouth daily. 01/13/22  Yes [provider]  aspirin EC 81 MG tablet Take 81 mg by mouth daily.   Yes [provider]  Besifloxacin HCl (BESIVANCE) 0.6 % SUSP Place 1 drop into the right eye 3 (three) times daily. For 2 weeks starting 03/29/22   Yes [provider]  Bromfenac Sodium (PROLENSA) 0.07 % SOLN Place 1 drop into the right eye at bedtime. For  4 weeks starting 03/29/22   Yes [provider]  butalbital-acetaminophen-caffeine (FIORICET) 50-325-40 MG tablet Take 1 tablet by mouth every 6 (six) hours as needed for headache. LIMIT USE TO PREVENT MEDICATION OVERUSE HEADACHE 04/09/22  Yes Emeterio Reeve, DO  Cholecalciferol 50 MCG (2000 UT) CAPS Take 2,000 Units by mouth daily.    Yes [provider]  dextromethorphan-guaiFENesin (MUCINEX DM) 30-600 MG 12hr tablet Take 1 tablet by mouth 2 (two) times daily as needed for cough. 04/09/22  Yes Emeterio Reeve, DO  Difluprednate 0.05 % EMUL Place 1 drop into the right eye in the morning, at noon, in the evening, and at bedtime. One drop 4 times daily for one week starting 03/29/22. One drop 3 times daily for one week. One drop 2 times daily for one week. One drop once daily for one week, then stop.   Yes [provider]  Fluticasone-Umeclidin-Vilant 100-62.5-25 MCG/INH AEPB Inhale 1 puff into the lungs daily. 10/03/18  Yes [provider]  HYDROcodone-acetaminophen (NORCO/VICODIN) 5-325 MG tablet Take 1 tablet by mouth every 8 (eight) hours as needed for severe pain. Must last 30 days 05/01/22 05/31/22 Yes Milinda Pointer, MD  ipratropium-albuterol (DUONEB) 0.5-2.5 (3) MG/3ML SOLN USE 1 VIAL VIA NEBULIZER EVERY 6 HOURS AS NEEDED FOR SHORTNESS OF BREATH OR WHEEZING 11/30/20  Yes Chrismon,  Vickki Muff, PA-C  levothyroxine (SYNTHROID) 25 MCG tablet TAKE 1 TABLET(25 MCG) BY MOUTH DAILY BEFORE BREAKFAST 01/03/22  Yes Birdie Sons, MD  nicotine (NICODERM CQ - DOSED IN MG/24 HOURS) 21 mg/24hr patch Place 1 patch (21 mg total) onto the skin daily. 04/10/22  Yes Emeterio Reeve, DO  OXYGEN Inhale 2.5 L/min into the lungs.   Yes [provider]  potassium chloride SA (KLOR-CON M) 20 MEQ tablet Take 20-40 mEq by mouth 2 (two) times daily. Take 40 mEq in the morning and 20 mEq in the evening. 04/11/22  Yes [provider]  roflumilast (DALIRESP) 500 MCG TABS tablet Take 500 mcg by mouth daily.   Yes [provider]  sertraline (ZOLOFT) 100 MG tablet TAKE 2 TABLETS BY MOUTH ONCE EVERY MORNING 04/12/22  Yes Birdie Sons, MD  simvastatin (ZOCOR) 20 MG tablet Take 20 mg by mouth at bedtime. 04/11/22  Yes [provider]  spironolactone (ALDACTONE) 50 MG tablet Take 1 tablet (50 mg total) by mouth daily. 04/10/22  Yes Emeterio Reeve, DO  torsemide (DEMADEX) 20 MG tablet Take 2 tablets (40 mg total) by mouth 2 (two) times daily. Take 4 tablets QAM and 2 tablets QPM Patient taking differently: Take 40-80 mg by mouth 2 (two) times daily. Take 4 tablets QAM and 2 tablets QPM 04/09/22  Yes Emeterio Reeve, DO  HYDROcodone-acetaminophen (NORCO/VICODIN) 5-325 MG tablet Take 1 tablet by mouth every 8 (eight) hours as needed for severe pain. Must last 30 days 05/31/22 06/30/22  Milinda Pointer, MD  HYDROcodone-acetaminophen (NORCO/VICODIN) 5-325 MG tablet Take 1 tablet by mouth every 8 (eight) hours as needed for severe pain. Must last 30 days 06/30/22 07/30/22  Milinda Pointer, MD     Critical care time: 37 minutes     Donell Beers, [place] Pager [phone] (please enter 7 digits) PCCM Consult Pager [phone] (please enter 7 digits)

## 2022-04-24 NOTE — Consult Note (Signed)
[facility] Cardiology  CARDIOLOGY CONSULT NOTE  Patient ID: Theresa [name] MRN: [id number] DOB/AGE: 1955/12/22 66 y.o.  Admit date: 04/23/2022 Referring Physician Cordelia Poche Primary Physician Caryn Section, Kirstie Peri, MD Primary Cardiologist [facility] Reason for Consultation New onset atrial flutter/fibrillation  HPI:  Theresa [name] is a 66 year old female with moderate pulmonary hypertension, COPD on 2.5 L, ongoing tobacco use, OSA on BiPAP for hypercapnia, chronic pain syndrome who presents with fatigue and shortness of breath.  She was discovered to be in atrial flutter with a heart rate of 150 prompting cardiology consultation.  The patient was recently admitted to the hospital from 04/01/2022 until 04/09/2022 with a COPD exacerbation.  She was treated with steroids and antibiotics and was eventually discharged on 4 L oxygen.  During this admission she also had severe headaches and some confusion which was thought to be related to trigeminal neuralgia.  She was doing relatively well until Friday when she started developing nausea, clamminess, shortness of breath, fatigue while she was in [place].  As a result she spent much of the day in bed.  Also described a feeling of heaviness in her chest without overt pain. No increase in SOB compared to baseline. She has home health nursing, and when she transmitted her vital signs and noted her HR to 150 she was instructed to present to the ED.   On arrival to the emergency department she was noted to have a heart rate of 150, with normal blood pressure.  She was on home 2.5 L of oxygen.  Blood work was relatively unremarkable.  She was given IV diltiazem without effect and eventually placed on a diltiazem drip.  Overnight she converted from atrial flutter to atrial fibrillation with a normal ventricular rate.  Review of systems complete and found to be negative unless listed above     Past Medical History:  Diagnosis Date   Acute postoperative pain  10/03/2017   Anxiety    BiPAP (biphasic positive airway pressure) dependence    at hs   Carpal tunnel syndrome    CHF (congestive heart failure) ([place])    1/18   CHF (congestive heart failure) (HCC)    Chronic generalized abdominal pain    Chronic rhinitis    COPD (chronic obstructive pulmonary disease) (HCC)    DDD (degenerative disc disease), cervical    DDD (degenerative disc disease), lumbosacral    Depression    Dyspnea    Edema    Flu    1/18   Gastritis    GERD (gastroesophageal reflux disease)    Hematuria    Hernia of abdominal wall 07/17/2018   Hernia, abdominal    Hypertension    Kidney stones    Low back pain    Lumbar radiculitis    Malodorous urine    Muscle weakness    Obesity    Oxygen dependent    Pneumonia due to COVID-19 virus 02/03/2020   Renal cyst    Sensory urge incontinence    Thyroid activity decreased    9/19   Tobacco abuse    Wheezing     Past Surgical History:  Procedure Laterality Date   ABDOMINAL HYSTERECTOMY     CARPAL TUNNEL RELEASE Left 2012   EPIGASTRIC HERNIA REPAIR N/A 08/13/2018   HERNIA REPAIR EPIGASTRIC ADULT; polypropylene mesh reinforcement.  Surgeon: Robert Bellow, MD;  Location: [facility] ORS;  Service: General;  Laterality: N/A;   RIGHT/LEFT HEART CATH AND CORONARY ANGIOGRAPHY N/A 07/11/2019   Procedure: RIGHT/LEFT HEART CATH  AND CORONARY ANGIOGRAPHY;  Surgeon: Yolonda Kida, MD;  Location: [facility];  Service: Cardiovascular;  Laterality: N/A;   TONSILLECTOMY     TUBAL LIGATION     UMBILICAL HERNIA REPAIR N/A 08/13/2018   HERNIA REPAIR UMBILICAL ADULT; polypropylene mesh reinforcement Surgeon: Robert Bellow, MD;  Location: [facility] ORS;  Service: General;  Laterality: N/A;    (Not in a hospital admission)  Social History   Socioeconomic History   Marital status: Married    Spouse name: Ovid Curd   Number of children: 2   Years of education: Not on file   Highest education level: 12th grade   Occupational History   Occupation: disability  Tobacco Use   Smoking status: Every Day    Packs/day: 0.50    Years: 44.00    Total pack years: 22.00    Types: Cigarettes   Smokeless tobacco: Never   Tobacco comments:    over a half pack  Vaping Use   Vaping Use: Never used  Substance and Sexual Activity   Alcohol use: No   Drug use: Yes    Comment: prescribed hydrocodone   Sexual activity: Not Currently    Birth control/protection: Surgical  Other Topics Concern   Not on file  Social History Narrative   ** Merged History Encounter **       Lives with spouse    Social Determinants of Health   Financial Resource Strain: Low Risk  (09/20/2021)   Overall Financial Resource Strain (CARDIA)    Difficulty of Paying Living Expenses: Not very hard  Food Insecurity: No Food Insecurity (12/14/2021)   Hunger Vital Sign    Worried About Running Out of Food in the Last Year: Never true    Ran Out of Food in the Last Year: Never true  Transportation Needs: No Transportation Needs (12/14/2021)   PRAPARE - Hydrologist (Medical): No    Lack of Transportation (Non-Medical): No  Physical Activity: Inactive (09/20/2021)   Exercise Vital Sign    Days of Exercise per Week: 0 days    Minutes of Exercise per Session: 0 min  Stress: No Stress Concern Present (09/20/2021)   [place]    Feeling of Stress : Not at all  Social Connections: Moderately Integrated (09/20/2021)   Social Connection and Isolation Panel [NHANES]    Frequency of Communication with Friends and Family: More than three times a week    Frequency of Social Gatherings with Friends and Family: Once a week    Attends Religious Services: More than 4 times per year    Active Member of [employer] or Organizations: No    Attends Archivist Meetings: Never    Marital Status: Married  Human resources officer Violence: Not At Risk (01/22/2020)    Humiliation, Afraid, Rape, and Kick questionnaire    Fear of Current or Ex-Partner: No    Emotionally Abused: No    Physically Abused: No    Sexually Abused: No    Family History  Problem Relation Age of Onset   Heart disease Mother    Stroke Mother    Coronary artery disease Mother    Lung cancer Sister    Cancer Sister    Breast cancer Sister    Alcohol abuse Father    Heart disease Father    Cancer Brother    Cancer Brother    Pneumonia Brother    Prostate cancer Neg Hx  Kidney cancer Neg Hx    Bladder Cancer Neg Hx       Review of systems complete and found to be negative unless listed above      PHYSICAL EXAM  General: Well developed, well nourished, in no acute distress HEENT:  Normocephalic and atramatic Neck:  No JVD.  Lungs: Wheezing present throughout. [place] oxygen in place.  Heart: Irregularly irregular . Normal S1 and S2 without gallops or murmurs.  Abdomen: Bowel sounds are positive, abdomen soft and non-tender  Msk:  Back normal, normal gait. Normal strength and tone for age. Extremities: No clubbing, cyanosis or edema.   Neuro: Alert and oriented X 3. Psych:  Good affect, responds appropriately  Labs:   Lab Results  Component Value Date   WBC 6.8 04/24/2022   HGB 13.0 04/24/2022   HCT 40.8 04/24/2022   MCV 99.3 04/24/2022   PLT 172 04/24/2022    Recent Labs  Lab 04/23/22 1619 04/23/22 1721 04/23/22 1814  NA 140  --   --   K 3.9  --   --   CL 105  --   --   CO2 27  --   --   BUN 18  --   --   CREATININE 0.82  --   --   CALCIUM 8.8*  --   --   PROT  --    < > 6.8  BILITOT  --    < > 0.5  ALKPHOS  --    < > 66  ALT  --    < > 24  AST  --    < > 23  GLUCOSE 142*  --   --    < > = values in this interval not displayed.   Lab Results  Component Value Date   CKTOTAL 85 10/11/2019   CKMB 1.0 12/27/2012   TROPONINI <0.03 02/07/2018    Lab Results  Component Value Date   CHOL 175 04/08/2022   CHOL 241 (H) 06/10/2021   Lab  Results  Component Value Date   HDL 58 04/08/2022   HDL 36 (L) 06/10/2021   Lab Results  Component Value Date   LDLCALC 86 04/08/2022   LDLCALC 171 (H) 06/10/2021   Lab Results  Component Value Date   TRIG 154 (H) 04/08/2022   TRIG 184 (H) 06/10/2021   Lab Results  Component Value Date   CHOLHDL 3.0 04/08/2022   No results found for: "LDLDIRECT"    Radiology: DG Chest 2 View  Result Date: 04/23/2022 CLINICAL DATA:  Chest pain EXAM: CHEST - 2 VIEW COMPARISON:  04/05/2022 FINDINGS: Cardiomegaly. Both lungs are clear. Emphysema. Disc degenerative disease of the thoracic spine. IMPRESSION: 1.  Cardiomegaly without acute abnormality of the lungs. 2.  Emphysema. Electronically Signed   By: Delanna Ahmadi M.D.   On: 04/23/2022 16:43   CT Angio Chest Pulmonary Embolism (PE) W or WO Contrast  Result Date: 04/09/2022 CLINICAL DATA:  PE suspected, tachycardia EXAM: CT ANGIOGRAPHY CHEST WITH CONTRAST TECHNIQUE: Multidetector CT imaging of the chest was performed using the standard protocol during bolus administration of intravenous contrast. Multiplanar CT image reconstructions and MIPs were obtained to evaluate the vascular anatomy. RADIATION DOSE REDUCTION: This exam was performed according to the departmental dose-optimization program which includes automated exposure control, adjustment of the mA and/or kV according to patient size and/or use of iterative reconstruction technique. CONTRAST:  75m OMNIPAQUE IOHEXOL 350 MG/ML SOLN COMPARISON:  None Available. FINDINGS: Cardiovascular: Examination for pulmonary  embolism is limited by breath motion artifact within this limitation, no evidence of pulmonary embolism through the proximal segmental pulmonary arterial level no evidence of pulmonary embolism. Mild cardiomegaly three-vessel coronary artery calcifications. No pericardial effusion. Aortic atherosclerosis. Mediastinum/Nodes: No enlarged mediastinal, hilar, or axillary lymph nodes. Thyroid gland,  trachea, and esophagus demonstrate no significant findings. Lungs/Pleura: Moderate emphysema. Near-total atelectasis or consolidation of the right middle lobe (series 6, image 57). Bandlike scarring or atelectasis of the bilateral lung bases. No pleural effusion or pneumothorax. Upper Abdomen: No acute abnormality. Multiple low-attenuation lesions throughout the liver, incompletely characterized although likely benign simple cysts. Musculoskeletal: No chest wall abnormality. No acute osseous findings. Review of the MIP images confirms the above findings. IMPRESSION: 1. Examination for pulmonary embolism is limited by breath motion artifact. Within this limitation, no evidence of pulmonary embolism through the proximal segmental pulmonary arterial level. 2. Near-total atelectasis or consolidation of the right middle lobe, of uncertain chronicity. Consider bronchoscopic evaluation. 3. Additional bandlike scarring or atelectasis of the bilateral lung bases. 4. Moderate emphysema. 5. Coronary artery disease. Aortic Atherosclerosis (ICD10-I70.0) and Emphysema (ICD10-J43.9). Electronically Signed   By: Delanna Ahmadi M.D.   On: 04/09/2022 15:13   MR BRAIN WO CONTRAST  Result Date: 04/08/2022 CLINICAL DATA:  Initial evaluation for neuro deficit, stroke suspected. EXAM: MRI HEAD WITHOUT CONTRAST TECHNIQUE: Multiplanar, multiecho pulse sequences of the brain and surrounding structures were obtained without intravenous contrast. COMPARISON:  Prior study from earlier the same day. FINDINGS: Brain: Cerebral volume within normal limits. Patchy and hazy T2/FLAIR hyperintensity involving the periventricular it deep white matter both cerebral hemispheres as well as the pons, most consistent with chronic small vessel ischemic disease, moderately advanced in nature. Prominent involvement of the bilateral basal ganglia and thalami noted as well. No evidence for acute or subacute infarct. Gray-white matter differentiation  maintained. No areas of chronic cortical infarction. No acute or chronic intracranial blood products. No mass lesion, midline shift or mass effect. No hydrocephalus or extra-axial fluid collection. Pituitary gland suprasellar region within normal limits. Vascular: Major intracranial vascular flow voids are maintained. Skull and upper cervical spine: Mild cerebellar tonsillar ectopia without frank Chiari malformation. Bone marrow signal intensity within normal limits. No scalp soft tissue abnormality. Sinuses/Orbits: Prior bilateral ocular lens replacement. Paranasal sinuses are largely clear. No significant mastoid effusion. Other: None. IMPRESSION: 1. No acute intracranial abnormality. 2. Moderately advanced chronic microvascular ischemic disease for age. Electronically Signed   By: Jeannine Boga M.D.   On: 04/08/2022 23:53   CT ANGIO HEAD NECK W WO CM  Result Date: 04/08/2022 CLINICAL DATA:  Acute neuro deficit. EXAM: CT ANGIOGRAPHY HEAD AND NECK TECHNIQUE: Multidetector CT imaging of the head and neck was performed using the standard protocol during bolus administration of intravenous contrast. Multiplanar CT image reconstructions and MIPs were obtained to evaluate the vascular anatomy. Carotid stenosis measurements (when applicable) are obtained utilizing NASCET criteria, using the distal internal carotid diameter as the denominator. RADIATION DOSE REDUCTION: This exam was performed according to the departmental dose-optimization program which includes automated exposure control, adjustment of the mA and/or kV according to patient size and/or use of iterative reconstruction technique. CONTRAST:  36m OMNIPAQUE IOHEXOL 350 MG/ML SOLN COMPARISON:  CT head 04/01/2022 FINDINGS: CT HEAD FINDINGS Brain: Ventricle size normal. Hypodensity in the frontal white matter bilaterally unchanged. Negative for acute infarct, acute hemorrhage, or mass. Vascular: Negative for hyperdense vessel Skull: Negative Sinuses:  Paranasal sinuses clear. Orbits: Bilateral cataract extraction. Review of the MIP images  confirms the above findings CTA NECK FINDINGS Aortic arch: Standard branching. Imaged portion shows no evidence of aneurysm or dissection. No significant stenosis of the major arch vessel origins. Mild atherosclerotic disease aortic arch and proximal great vessels Right carotid system: Mild atherosclerotic disease at the right carotid bifurcation without stenosis. Left carotid system: Mild atherosclerotic calcification left carotid bifurcation without stenosis Vertebral arteries: Both vertebral arteries are widely patent to the skull base without stenosis Skeleton: Mild cervical spondylosis. No acute skeletal abnormality. Periapical lucency around right upper molar. Other neck: Negative for mass or adenopathy Upper chest: Mild apical emphysema.  No acute abnormality Review of the MIP images confirms the above findings CTA HEAD FINDINGS Anterior circulation: Atherosclerotic calcification and mild stenosis in the cavernous carotid bilaterally. Anterior and middle cerebral arteries widely patent without stenosis or aneurysm. Posterior circulation: Both vertebral arteries patent to the basilar. PICA patent bilaterally. Basilar widely patent. AICA, superior cerebellar, posterior cerebral arteries patent. No stenosis or aneurysm. Fetal origin posterior cerebral artery bilaterally. Venous sinuses: Normal venous enhancement Anatomic variants: None Review of the MIP images confirms the above findings IMPRESSION: 1. CT head negative for acute abnormality. Chronic white matter changes bilaterally due to chronic ischemia. 2. Mild atherosclerotic disease carotid bifurcation bilaterally. No carotid or vertebral artery stenosis in the neck. Mild atherosclerotic stenosis in the cavernous carotid bilaterally. 3. No intracranial large vessel occlusion or flow limiting stenosis Electronically Signed   By: Franchot Gallo M.D.   On: 04/08/2022 11:28    MR CERVICAL SPINE WO CONTRAST  Result Date: 04/07/2022 CLINICAL DATA:  Cervical myelopathy EXAM: MRI CERVICAL SPINE WITHOUT CONTRAST TECHNIQUE: Multiplanar, multisequence MR imaging of the cervical spine was performed. No intravenous contrast was administered. COMPARISON:  12/25/2007 MRI cervical spine 10/11/2019 CT cervical spine FINDINGS: Evaluation is somewhat limited by motion artifact. Alignment: Straightening and mild reversal of the normal cervical lordosis. No listhesis. Vertebrae: No acute fracture or suspicious osseous lesion. T1 and T2 hyperintense focus in the C5 vertebral body, most likely a benign hemangioma. Vertebral body heights are preserved. Congenitally short pedicles, which narrow the AP diameter of the spinal canal. Cord: Normal signal and morphology. Previously noted prominence of the central canal at the level of C7 is no longer seen. Posterior Fossa, vertebral arteries, paraspinal tissues: Negative. Disc levels: C2-C3: No significant disc bulge. No spinal canal stenosis or neural foraminal narrowing. C3-C4: Minimal disc bulge. Facet and uncovertebral hypertrophy, left-greater-than-right. No spinal canal stenosis. Mild-to-moderate right and severe left neural foraminal narrowing, which have progressed from the prior MRI . C4-C5: No significant disc bulge. Facet and uncovertebral hypertrophy. Moderate left and mild right neural foraminal narrowing, which have progressed from the prior exam. C5-C6: Minimal disc bulge. Ligamentum flavum hypertrophy. Facet and uncovertebral hypertrophy. Mild spinal canal stenosis, with severe right and moderate to severe left neural foraminal narrowing, which have progressed from the prior exam. C6-C7: Disc height loss and minimal disc bulge. Facet and uncovertebral hypertrophy. Ligamentum flavum hypertrophy. Mild spinal canal stenosis overall unchanged. Mild-to-moderate right and severe left neural foraminal narrowing, which have progressed from prior  exam. C7-T1: No significant disc bulge. No spinal canal stenosis or neural foraminal narrowing. IMPRESSION: 1. Evaluation is limited by motion artifact. Within this limitation, there progressive degenerative changes compared to 2009, with mild spinal canal stenosis C4-C5 and C5-C6. 2. Multilevel facet and uncovertebral hypertrophy, which causes up to severe neural foraminal narrowing, which is seen on the left at C3-C4 and C6-C7 and on the right at C5-C6. Additional less  significant neural foraminal narrowing, as described above. Electronically Signed   By: Merilyn Baba M.D.   On: 04/07/2022 00:59   DG Chest Port 1 View  Result Date: 04/05/2022 CLINICAL DATA:  Shortness of breath, hypoxia EXAM: PORTABLE CHEST 1 VIEW COMPARISON:  04/01/2022 FINDINGS: No significant change in AP portable examination. Cardiomegaly with mild, diffuse interstitial opacity and pulmonary vascular prominence. The visualized skeletal structures are unremarkable. IMPRESSION: No significant change in AP portable examination. Cardiomegaly with mild, diffuse interstitial opacity and pulmonary vascular prominence, findings most likely reflect edema. No new or focal airspace opacity. Electronically Signed   By: Delanna Ahmadi M.D.   On: 04/05/2022 09:32   MR BRAIN WO CONTRAST  Result Date: 04/04/2022 CLINICAL DATA:  Occipital region headache and scalp tenderness. EXAM: MRI HEAD WITHOUT CONTRAST TECHNIQUE: Multiplanar, multiecho pulse sequences of the brain and surrounding structures were obtained without intravenous contrast. COMPARISON:  Head CT 04/01/2022. Head MRI 10/11/2019 and 12/10/2017. FINDINGS: Brain: There is no evidence of an acute infarct, intracranial hemorrhage, mass, midline shift, or extra-axial fluid collection. Patchy T2 hyperintensities in the cerebral white matter, basal ganglia, thalami, and brainstem are stable to mildly increased compared to the prior MRI and are nonspecific but compatible with moderate chronic small  vessel ischemic disease. The ventricles and sulci are within normal limits for age. Vascular: Major intracranial vascular flow voids are preserved. Skull and upper cervical spine: Diffuse bone marrow heterogeneity including increased trace diffusion weighted signal intensity, chronic based on the 2019 MRI and nonspecific. No discrete focal lesion. Sinuses/Orbits: Bilateral cataract extraction. Paranasal sinuses and mastoid air cells are clear. Other: None. IMPRESSION: 1. No acute intracranial abnormality. 2. Moderate chronic small vessel ischemic disease. Electronically Signed   By: Logan Bores M.D.   On: 04/04/2022 14:30   ECHOCARDIOGRAM COMPLETE  Result Date: 04/02/2022    ECHOCARDIOGRAM REPORT   Patient Name:   [facility] Date of Exam: 04/02/2022 Medical Rec #:  [id number]      Height:       62.0 in Accession #:    [id number]     Weight:       202.6 lb Date of Birth:  1956/06/26       BSA:          1.922 m Patient Age:    16 years       BP:           101/58 mmHg Patient Gender: F              HR:           93 bpm. Exam Location:  [facility] Procedure: 2D Echo Indications:     CHF I50.31  History:         Patient has prior history of Echocardiogram examinations, most                  recent 08/08/2021.  Sonographer:     Kathlen Brunswick RDCS Referring Phys:  [id number] Soledad Gerlach NIU Diagnosing Phys: Yolonda Kida MD IMPRESSIONS  1. Left ventricular ejection fraction, by estimation, is 55 to 60%. The left ventricle has normal function. The left ventricle has no regional wall motion abnormalities. Left ventricular diastolic parameters are consistent with Grade I diastolic dysfunction (impaired relaxation).  2. Right ventricular systolic function is low normal. The right ventricular size is mildly enlarged.  3. Left atrial size was mildly dilated.  4. Right atrial size was mildly dilated.  5. The mitral valve is normal in  structure. Trivial mitral valve regurgitation.  6. The aortic valve is grossly normal. Aortic valve  regurgitation is not visualized. Conclusion(s)/Recommendation(s): Poor windows for evaluation of left ventricular function by transthoracic echocardiography. Would recommend an alternative means of evaluation. FINDINGS  Left Ventricle: Left ventricular ejection fraction, by estimation, is 55 to 60%. The left ventricle has normal function. The left ventricle has no regional wall motion abnormalities. The left ventricular internal cavity size was normal in size. There is  no left ventricular hypertrophy. Left ventricular diastolic parameters are consistent with Grade I diastolic dysfunction (impaired relaxation). Right Ventricle: The right ventricular size is mildly enlarged. No increase in right ventricular wall thickness. Right ventricular systolic function is low normal. Left Atrium: Left atrial size was mildly dilated. Right Atrium: Right atrial size was mildly dilated. Pericardium: There is no evidence of pericardial effusion. Mitral Valve: The mitral valve is normal in structure. Trivial mitral valve regurgitation. Tricuspid Valve: The tricuspid valve is normal in structure. Tricuspid valve regurgitation is trivial. Aortic Valve: The aortic valve is grossly normal. Aortic valve regurgitation is not visualized. Aortic valve mean gradient measures 8.0 mmHg. Aortic valve peak gradient measures 15.1 mmHg. Aortic valve area, by VTI measures 2.58 cm. Pulmonic Valve: The pulmonic valve was normal in structure. Pulmonic valve regurgitation is not visualized. Aorta: The ascending aorta was not well visualized. IAS/Shunts: No atrial level shunt detected by color flow Doppler.  LEFT VENTRICLE PLAX 2D LVIDd:         4.55 cm      Diastology LVIDs:         3.12 cm      LV e' medial:    9.90 cm/s LV PW:         1.10 cm      LV E/e' medial:  10.0 LV IVS:        0.85 cm      LV e' lateral:   12.30 cm/s LVOT diam:     2.10 cm      LV E/e' lateral: 8.1 LV SV:         95 LV SV Index:   49 LVOT Area:     3.46 cm  LV Volumes (MOD)  LV vol d, MOD A4C: 105.0 ml LV vol s, MOD A4C: 39.0 ml LV SV MOD A4C:     105.0 ml RIGHT VENTRICLE RV Basal diam:  4.04 cm RV S prime:     14.40 cm/s TAPSE (M-mode): 2.8 cm LEFT ATRIUM             Index        RIGHT ATRIUM           Index LA diam:        4.30 cm 2.24 cm/m   RA Area:     29.70 cm LA Vol (A2C):   97.2 ml 50.57 ml/m  RA Volume:   110.00 ml 57.23 ml/m LA Vol (A4C):   45.2 ml 23.51 ml/m LA Biplane Vol: 66.4 ml 34.54 ml/m  AORTIC VALVE                     PULMONIC VALVE AV Area (Vmax):    2.50 cm      PV Vmax:       1.06 m/s AV Area (Vmean):   2.45 cm      PV Peak grad:  4.5 mmHg AV Area (VTI):     2.58 cm AV Vmax:  194.00 cm/s AV Vmean:          128.000 cm/s AV VTI:            0.367 m AV Peak Grad:      15.1 mmHg AV Mean Grad:      8.0 mmHg LVOT Vmax:         140.00 cm/s LVOT Vmean:        90.700 cm/s LVOT VTI:          0.273 m LVOT/AV VTI ratio: 0.74  AORTA Ao Root diam: 3.00 cm MITRAL VALVE                TRICUSPID VALVE MV Area (PHT): 6.27 cm     TV Peak grad:   43.3 mmHg MV Decel Time: 121 msec     TV Vmax:        3.29 m/s MV E velocity: 99.40 cm/s MV A velocity: 116.00 cm/s  SHUNTS MV E/A ratio:  0.86         Systemic VTI:  0.27 m                             Systemic Diam: 2.10 cm Dwayne Prince Rome MD Electronically signed by Yolonda Kida MD Signature Date/Time: 04/02/2022/3:26:17 PM    Final    CT HEAD WO CONTRAST (5MM)  Result Date: 04/01/2022 CLINICAL DATA:  66 year old female presents with new or worsening headache. EXAM: CT HEAD WITHOUT CONTRAST TECHNIQUE: Contiguous axial images were obtained from the base of the skull through the vertex without intravenous contrast. RADIATION DOSE REDUCTION: This exam was performed according to the departmental dose-optimization program which includes automated exposure control, adjustment of the mA and/or kV according to patient size and/or use of iterative reconstruction technique. COMPARISON:  Comparison is made with October 11, 2019. FINDINGS: Brain: No evidence of acute infarction, hemorrhage, hydrocephalus, extra-axial collection or mass lesion/mass effect. Signs of mild atrophy and chronic microvascular ischemic change which is similar to previous imaging Vascular: No hyperdense vessel or unexpected calcification. Skull: Normal. Negative for fracture or focal lesion. Sinuses/Orbits: Visualized paranasal sinuses and orbits show no acute process to the extent evaluated. Other: None IMPRESSION: 1. No acute intracranial abnormality. 2. Signs of mild chronic microvascular ischemic change which is similar to previous imaging. Electronically Signed   By: Zetta Bills M.D.   On: 04/01/2022 14:07   DG Chest Port 1 View  Result Date: 04/01/2022 CLINICAL DATA:  Questionable sepsis.  Shortness of breath. EXAM: PORTABLE CHEST 1 VIEW COMPARISON:  Chest radiographs 08/06/2021 FINDINGS: The cardiac silhouette is upper limits of normal in size. There is unchanged enlargement of the pulmonary arteries. Aortic atherosclerosis is noted. The lungs are hyperinflated with chronic peribronchial thickening and underlying emphysema. The interstitial markings are slightly more prominent diffusely compared to the prior study. No focal airspace consolidation, sizeable pleural effusion, or pneumothorax is identified. No acute osseous abnormality is seen. IMPRESSION: COPD with superimposed mild edema or infection not excluded. Electronically Signed   By: Logan Bores M.D.   On: 04/01/2022 08:31    EKG: Atrial flutter at a rate of 155 bpm.  Unclear if flutter waves are contributing to ST and T wave changes throughout.  Repeat EKG shows atrial fibrillation with a rate of 100.  Nonspecific T wave abnormalities.  Prolonged QTc.  ASSESSMENT AND PLAN:  Nuriya Stuck is a 66 year old female with moderate pulmonary hypertension, COPD on 2.5 L,  ongoing tobacco use, OSA on BiPAP for hypercapnia, chronic pain syndrome who presents with fatigue and shortness of  breath.  She was discovered to be in atrial flutter with a heart rate of 150 prompting cardiology consultation.  # New onset atrial fibrillation/flutter Presented with fatigue and shortness of breath discovered to be in atrial flutter with a rate of 150.  She converted to atrial fibrillation that has been rate controlled with diltiazem. -CHA2DS2-VASc score = 3 (CHF, HTN, VASC) - Agree with Xarelto for stroke risk redcution -She is now off IV diltiazem since 3 AM -Start diltiazem 120 mg daily for rate control and follow closely. May need to increase  #Nonobstructive CAD Underwent heart catheterization in 2020 for evaluation of HFpEF and shortness of breath.  During this time was discovered to have a 50% mid LAD lesion which was felt to be nonsignificant. -Okay to hold aspirin in the setting of Xarelto use -Continue simvastatin 20 mg daily - LDL goal < 70  #HFpEF #Moderate pulmonary hypertension She does have some issues with volume overload chronically and takes spironolactone and torsemide for this.  This is all likely related to COPD and tobacco use as well as pulmonary hypertension. -Continue home spironolactone milligrams -Continue home torsemide  Signed: Andrez Grime MD 04/24/2022, 8:25 AM

## 2022-04-25 ENCOUNTER — Encounter: Payer: Medicare HMO | Admitting: Pain Medicine

## 2022-04-25 ENCOUNTER — Telehealth (HOSPITAL_COMMUNITY): Payer: Self-pay | Admitting: Pharmacy Technician

## 2022-04-25 ENCOUNTER — Other Ambulatory Visit: Payer: Self-pay

## 2022-04-25 ENCOUNTER — Other Ambulatory Visit (HOSPITAL_COMMUNITY): Payer: Self-pay

## 2022-04-25 DIAGNOSIS — E669 Obesity, unspecified: Secondary | ICD-10-CM

## 2022-04-25 DIAGNOSIS — J9622 Acute and chronic respiratory failure with hypercapnia: Secondary | ICD-10-CM

## 2022-04-25 DIAGNOSIS — I4892 Unspecified atrial flutter: Secondary | ICD-10-CM | POA: Diagnosis not present

## 2022-04-25 DIAGNOSIS — E785 Hyperlipidemia, unspecified: Secondary | ICD-10-CM

## 2022-04-25 DIAGNOSIS — I5032 Chronic diastolic (congestive) heart failure: Secondary | ICD-10-CM | POA: Diagnosis not present

## 2022-04-25 DIAGNOSIS — J9621 Acute and chronic respiratory failure with hypoxia: Secondary | ICD-10-CM | POA: Diagnosis not present

## 2022-04-25 DIAGNOSIS — E782 Mixed hyperlipidemia: Secondary | ICD-10-CM

## 2022-04-25 DIAGNOSIS — I480 Paroxysmal atrial fibrillation: Secondary | ICD-10-CM | POA: Diagnosis not present

## 2022-04-25 DIAGNOSIS — J441 Chronic obstructive pulmonary disease with (acute) exacerbation: Secondary | ICD-10-CM | POA: Diagnosis not present

## 2022-04-25 LAB — BASIC METABOLIC PANEL
Anion gap: 7 (ref 5–15)
BUN: 23 mg/dL (ref 8–23)
CO2: 31 mmol/L (ref 22–32)
Calcium: 8.9 mg/dL (ref 8.9–10.3)
Chloride: 103 mmol/L (ref 98–111)
Creatinine, Ser: 0.87 mg/dL (ref 0.44–1.00)
GFR, Estimated: >60 mL/min (ref >60)
Glucose, Bld: 138 mg/dL — ABNORMAL HIGH (ref 70–99)
Potassium: 4 mmol/L (ref 3.5–5.1)
Sodium: 141 mmol/L (ref 135–145)

## 2022-04-25 MED ORDER — AMIODARONE HCL IN DEXTROSE 360-4.14 MG/200ML-% IV SOLN: 60.0000 mg/h | INTRAVENOUS | Status: DC

## 2022-04-25 MED ORDER — ATORVASTATIN CALCIUM 10 MG PO TABS
10.0000 mg | ORAL_TABLET | Freq: Every day | ORAL | Status: DC
  Administered 2022-04-26 – 2022-04-28 (×3): 10 mg via ORAL
  Filled 2022-04-25 (×3): qty 1

## 2022-04-25 MED ORDER — METHYLPREDNISOLONE SODIUM SUCC 40 MG IJ SOLR
40.0000 mg | Freq: Two times a day (BID) | INTRAMUSCULAR | Status: DC
  Administered 2022-04-25 – 2022-04-29 (×8): 40 mg via INTRAVENOUS
  Filled 2022-04-25 (×8): qty 1

## 2022-04-25 MED ORDER — LORAZEPAM 2 MG/ML IJ SOLN
0.5000 mg | Freq: Two times a day (BID) | INTRAMUSCULAR | Status: DC | PRN
Start: 2022-04-25 — End: 2022-04-29
  Administered 2022-04-25 – 2022-04-27 (×2): 0.5 mg via INTRAVENOUS
  Filled 2022-04-25 (×2): qty 1

## 2022-04-25 MED ORDER — DIGOXIN 0.25 MG/ML IJ SOLN
0.5000 mg | Freq: Once | INTRAMUSCULAR | Status: AC
Start: 2022-04-25 — End: 2022-04-25
  Administered 2022-04-25: 0.5 mg via INTRAVENOUS
  Filled 2022-04-25: qty 2

## 2022-04-25 MED ORDER — DILTIAZEM HCL 30 MG PO TABS
30.0000 mg | ORAL_TABLET | Freq: Four times a day (QID) | ORAL | Status: AC
  Administered 2022-04-25 – 2022-04-27 (×9): 30 mg via ORAL
  Filled 2022-04-25 (×9): qty 1

## 2022-04-25 MED ORDER — FLUTICASONE FUROATE-VILANTEROL 100-25 MCG/ACT IN AEPB
1.0000 | INHALATION_SPRAY | Freq: Every day | RESPIRATORY_TRACT | Status: DC
  Administered 2022-04-25 – 2022-04-29 (×5): 1 via RESPIRATORY_TRACT
  Filled 2022-04-25: qty 28

## 2022-04-25 MED ORDER — AMIODARONE LOAD VIA INFUSION
150.0000 mg | Freq: Once | INTRAVENOUS | Status: DC
  Filled 2022-04-25: qty 83.34

## 2022-04-25 MED ORDER — AMIODARONE HCL IN DEXTROSE 360-4.14 MG/200ML-% IV SOLN
30.0000 mg/h | INTRAVENOUS | Status: DC
  Administered 2022-04-25 – 2022-04-27 (×3): 30 mg/h via INTRAVENOUS
  Filled 2022-04-25 (×3): qty 200

## 2022-04-25 MED ORDER — TIOTROPIUM BROMIDE MONOHYDRATE 18 MCG IN CAPS
18.0000 ug | ORAL_CAPSULE | Freq: Every day | RESPIRATORY_TRACT | Status: DC
  Administered 2022-04-25: 18 ug via RESPIRATORY_TRACT

## 2022-04-25 MED ORDER — AMIODARONE HCL IN DEXTROSE 360-4.14 MG/200ML-% IV SOLN
60.0000 mg/h | INTRAVENOUS | Status: DC
  Administered 2022-04-25 (×2): 60 mg/h via INTRAVENOUS
  Filled 2022-04-25: qty 200

## 2022-04-25 MED ORDER — TORSEMIDE 20 MG PO TABS
40.0000 mg | ORAL_TABLET | Freq: Two times a day (BID) | ORAL | Status: DC
  Administered 2022-04-25 – 2022-04-29 (×9): 40 mg via ORAL
  Filled 2022-04-25 (×9): qty 2

## 2022-04-25 MED ORDER — IPRATROPIUM BROMIDE 0.02 % IN SOLN
0.5000 mg | Freq: Four times a day (QID) | RESPIRATORY_TRACT | Status: AC
  Administered 2022-04-25 – 2022-04-28 (×11): 0.5 mg via RESPIRATORY_TRACT
  Filled 2022-04-25 (×13): qty 2.5

## 2022-04-25 MED ORDER — MAGNESIUM HYDROXIDE 400 MG/5ML PO SUSP
30.0000 mL | Freq: Every evening | ORAL | Status: DC | PRN
  Administered 2022-04-25: 30 mL via ORAL
  Filled 2022-04-25: qty 30

## 2022-04-25 MED ORDER — LORAZEPAM 2 MG/ML IJ SOLN
1.0000 mg | Freq: Once | INTRAMUSCULAR | Status: AC
  Administered 2022-04-25: 1 mg via INTRAVENOUS
  Filled 2022-04-25: qty 1

## 2022-04-25 MED ORDER — SIMETHICONE 40 MG/0.6ML PO SUSP
40.0000 mg | Freq: Four times a day (QID) | ORAL | Status: DC | PRN
  Administered 2022-04-25: 40 mg via ORAL
  Filled 2022-04-25 (×4): qty 0.6

## 2022-04-25 MED ORDER — AMIODARONE HCL IN DEXTROSE 360-4.14 MG/200ML-% IV SOLN: 30.0000 mg/h | INTRAVENOUS | Status: DC

## 2022-04-25 MED ORDER — MELATONIN 5 MG PO TABS
5.0000 mg | ORAL_TABLET | Freq: Every evening | ORAL | Status: DC | PRN
  Administered 2022-04-25: 5 mg via ORAL
  Filled 2022-04-25: qty 1

## 2022-04-25 MED ORDER — LEVALBUTEROL HCL 1.25 MG/0.5ML IN NEBU
1.2500 mg | INHALATION_SOLUTION | Freq: Four times a day (QID) | RESPIRATORY_TRACT | Status: AC
  Administered 2022-04-25 – 2022-04-28 (×11): 1.25 mg via RESPIRATORY_TRACT
  Filled 2022-04-25 (×12): qty 0.5

## 2022-04-25 MED ORDER — AMIODARONE LOAD VIA INFUSION
150.0000 mg | Freq: Once | INTRAVENOUS | Status: AC
  Administered 2022-04-25: 150 mg via INTRAVENOUS
  Filled 2022-04-25: qty 83.34

## 2022-04-25 NOTE — Assessment & Plan Note (Signed)
-

## 2022-04-25 NOTE — Progress Notes (Signed)
  Progress Note   Patient: Theresa [name] QMG:[id number] DOB: 1955-12-22 DOA: 04/23/2022     2 DOS: the patient was seen and examined on 04/25/2022    Assessment and Plan: * Paroxysmal atrial fibrillation with RVR ([place]) This morning the patient was back in atrial flutter.  Amiodarone drip was held overnight secondary to hypotension.  Amiodarone restarted this morning.  Heart rate still elevated this afternoon.  Continue on Xarelto for anticoagulation.   Acute on chronic respiratory failure with hypoxia and hypercapnia (HCC) The patient had worsening respiratory distress yesterday afternoon and change to stepdown unit setting.  The patient normally wears 2.5 L of oxygen during the day and BiPAP at night.  Yesterday afternoon required high flow nasal cannula.  Did have a pulse ox of 82% on her baseline oxygen.  COPD with acute exacerbation (HCC) Continue Solu-Medrol inhalers and nebulizer treatments.  Still tight but moving slightly better air than yesterday.  Chronic diastolic CHF (congestive heart failure) (HCC) Holding parameters on torsemide.  Hold Aldactone with relative hypotension.  Pulmonary arterial hypertension (HCC) Continue Ambrisentan  Hypothyroidism, unspecified Continue levothyroxine  Depression Continue Zoloft  Obesity (BMI 30-39.9) BMI of 35.20  Hyperlipidemia, unspecified Continue Lipitor        Subjective: Patient still not doing that well.  Heart rate in the 130s this morning.  Still has difficulty with taking a deep breath.  Some cough.  Still with wheezing.  Physical Exam: Vitals:   04/25/22 0600 04/25/22 0700 04/25/22 0800 04/25/22 0900  BP: (!) 91/56 108/66 112/77 109/79  Pulse: 77 78 (!) 140 (!) 141  Resp: 15 14 (!) 22 (!) 21  Temp:   (!) 97.5 F (36.4 C)   TempSrc:   Oral   SpO2: 98% 99% 100% 100%  Weight:      Height:       Physical Exam HENT:     Head: Normocephalic.     Mouth/Throat:     Pharynx: No oropharyngeal exudate.  Eyes:      General: Lids are normal.     Conjunctiva/sclera: Conjunctivae normal.  Cardiovascular:     Rate and Rhythm: Tachycardia present. Rhythm irregularly irregular.     Heart sounds: S1 normal and S2 normal.  Pulmonary:     Breath sounds: Examination of the right-middle field reveals wheezing. Examination of the left-middle field reveals wheezing. Examination of the right-lower field reveals decreased breath sounds and wheezing. Examination of the left-lower field reveals decreased breath sounds and wheezing. Decreased breath sounds and wheezing present. No rhonchi or rales.  Abdominal:     Palpations: Abdomen is soft.     Tenderness: There is no abdominal tenderness.  Musculoskeletal:     Right ankle: Swelling present.     Left ankle: Swelling present.  Skin:    General: Skin is warm.     Findings: No rash.  Neurological:     Mental Status: She is alert and oriented to person, place, and time.     Data Reviewed: Creatinine 0.87  Family Communication: Left message for husband on the phone  Disposition: Status is: Inpatient Remains inpatient appropriate because: Still with heart rate in the 130s on amiodarone drip and still very tight requiring IV Solu-Medrol Planned Discharge Destination: Home    Time spent: 30 minutes Case discussed with cardiology  Author: Loletha Grayer, MD 04/25/2022 2:03 PM  For on call review [email or web address].

## 2022-04-25 NOTE — Telephone Encounter (Signed)
Pharmacy Patient Advocate Encounter  Insurance verification completed.    The patient is insured through [employer] Part D   The patient is currently admitted and ran test claims for the following: Xarelto 20 mg.  Copays and coinsurance results were relayed to Inpatient clinical team.

## 2022-04-25 NOTE — Assessment & Plan Note (Signed)
The patient had worsening respiratory distress yesterday afternoon and change to stepdown unit setting.  The patient normally wears 2.5 L of oxygen during the day and BiPAP at night.  Yesterday afternoon required high flow nasal cannula.  Did have a pulse ox of 82% on her baseline oxygen.

## 2022-04-25 NOTE — Progress Notes (Signed)
  Amiodarone Drug - Drug Interaction Consult Note  Recommendations:   1) monitor potassium closely on concomitant diuretics.   2) simvastatin was changed to atorvastatin given DDI  3) we will follow therapy for new Qt-prolonging agents and/or DDIs  Amiodarone is metabolized by the cytochrome P450 system and therefore has the potential to cause many drug interactions. Amiodarone has an average plasma half-life of 50 days (range 20 to 100 days).   There is potential for drug interactions to occur several weeks or months after stopping treatment and the onset of drug interactions may be slow after initiating amiodarone.   _0  Statins: Increased risk of myopathy. Simvastatin- restrict dose to 8m daily. Other statins: counsel patients to report any muscle pain or weakness immediately.  _1  Anticoagulants: Amiodarone can increase anticoagulant effect. Consider warfarin dose reduction. Patients should be monitored closely and the dose of anticoagulant altered accordingly, remembering that amiodarone levels take several weeks to stabilize.  _2  Antiepileptics: Amiodarone can increase plasma concentration of phenytoin, the dose should be reduced. Note that small changes in phenytoin dose can result in large changes in levels. Monitor patient and counsel on signs of toxicity.  _3  Beta blockers: increased risk of bradycardia, AV block and myocardial depression. Sotalol - avoid concomitant use.  _4   Calcium channel blockers (diltiazem and verapamil): increased risk of bradycardia, AV block and myocardial depression.  _5   Cyclosporine: Amiodarone increases levels of cyclosporine. Reduced dose of cyclosporine is recommended.  _6  Digoxin dose should be halved when amiodarone is started.  _7  Diuretics: increased risk of cardiotoxicity if hypokalemia occurs.  _8  Oral hypoglycemic agents (glyburide, glipizide, glimepiride): increased risk of hypoglycemia. Patient's glucose levels should be monitored  closely when initiating amiodarone therapy.   _9  Drugs that prolong the QT interval:  Torsades de pointes risk may be increased with concurrent use - avoid if possible.  Monitor QTc, also keep magnesium/potassium WNL if concurrent therapy can't be avoided.  Antibiotics: e.g. fluoroquinolones, erythromycin.  Antiarrhythmics: e.g. quinidine, procainamide, disopyramide, sotalol.  Antipsychotics: e.g. phenothiazines, haloperidol.   Lithium, tricyclic antidepressants, and methadone. Thank You,  RDallie Piles 04/25/2022 9:36 AM

## 2022-04-25 NOTE — Progress Notes (Addendum)
CROSS COVER NOTE  NAME: Mahsa Hanser MRN: [id number] DOB : 1955-12-09    Date of Service   04/25/2022  HPI/Events of Note   Secure chat received from nursing reporting downtrending BP. Current BP 77/63 while on amiodarone drip. Patient was previously on IV diltiazem and not able to tolerate that 2/2 hypotension. HR currently in 110s  Per nursing, the on call Cardiologist was contacted earlier tonight regarding HR remaining in the 130s and they recommended leaving Amiodarone at 60 mg/hr for 3 additional hours and reaching out to the hospitalist team for additional issues related to M(r)s Wenger's AFIB/A Flutter overnight.  Interventions   Plan: Stop IV Amiodarone       This document was prepared using Dragon voice recognition software and may include unintentional dictation errors.  Neomia Glass DNP, MHA, FNP-BC Nurse Practitioner [facility] Pager [phone]

## 2022-04-25 NOTE — Progress Notes (Signed)
NAME:  Theresa [name], MRN:  [id number], DOB:  1956/03/14, LOS: 2 ADMISSION DATE:  04/23/2022, CONSULTATION DATE: 04/24/22 REFERRING MD: Dr. Leslye Peer, CHIEF COMPLAINT: Shortness of Breath   History of Present Illness:  This is a 66 yo female who presented to [facility] ER on 07/8 with elevated heart rate.  According to the pt she checked her heart rate via a cardiac monitor at home that routes to her outpatient provider, and was instructed to proceed to urgent care or the ER for further evaluation due to heart rate reading between 150 to 160's.  She also endorsed some chest discomfort, but denied chest pain.  04/25/22- patient in [facility] today.  Patient was fidgety and aggitated reporting discomfort and anxiety. Denies alcohol/drug withdrawal. She received ativan for aggitation and 1 dose of digoxin 524mg.   Pertinent  Medical History  Tobacco Abuse Obesity GERD DDD COPD (Bipap qhs) Chronic Diastolic CHF (Echo EF 55 to 670% grade I diastolic dysfunction) Anxiety HTN COVID-19 Current Everyday Smoker  Lumbar Radiculitis   Significant Hospital Events: Including procedures, antibiotic start and stop dates in addition to other pertinent events   07/8: Pt admitted to the progressive care unit with atrial flutter/fibrillation requiring cardizem gtt but remained in the ER pending bed availability  07/9: Due to soft bp's cardizem gtt discontinued and amiodarone gtt initiated.  Pt developed acute on chronic hypoxic respiratory failure secondary to AECOPD requiring HFNC PCCM team consulted to assist with management.  Pt changed to stepdown status    Objective   Blood pressure 109/79, pulse (!) 141, temperature (!) 97.5 F (36.4 C), temperature source Oral, resp. rate (!) 21, height _0  (1.575 m), weight 87.3 kg, SpO2 100 %.        Intake/Output Summary (Last 24 hours) at 04/25/2022 1007 Last data filed at 04/25/2022 0900 Gross per 24 hour  Intake 1158.54 ml  Output 2350 ml  Net -1191.46 ml     Filed Weights   04/23/22 1612 04/24/22 1528  Weight: 90.6 kg 87.3 kg    Examination: General: Acutely ill appearing female, mild respiratory distress on HFNC  HENT: Supple, no JVD  Lungs: Diffuse wheezing throughout, mild tachypnea Cardiovascular: Irregular irregular, no r/g, 2+ radial/2+ distal pulses, no edema  Abdomen: +BS x4, obese, soft, non tender, non distended  Extremities: Normal bulk and tone, no edema  Neuro: Alert and oriented, follows commands  GU: Deferred   Resolved Hospital Problem list     Assessment & Plan:  Acute on chronic hypoxic respiratory failure secondary to AECOPD  Hx: Current everyday smoker and Bipap qhs  - Prn Bipap or supplemental O2 for dyspnea and/or hypoxia  - Bipap qhs  - Scheduled and prn bronchodilator therapy - IV and nebulized steroids  - Smoking cessation counseling provided  - Continue nicotine patch   New onset atrial fibrillation/flutter  Moderate pulmonary HTN  Chronic diastolic CHF  - Continuous telemetry monitoring  - Continue amiodarone gtt for rate control - Cardiology consulted appreciate input  - Continue outpatient torsemide and simvastatin  - Continue xarelto  Best Practice (right click and "Reselect all SmartList Selections" daily)   Diet/type: Regular consistency (see orders) DVT prophylaxis: Xarelto GI prophylaxis: N/A Lines: N/A Foley:  N/A Code Status:  full code Last date of multidisciplinary goals of care discussion [N/A]  Labs   CBC: Recent Labs  Lab 04/23/22 1619 04/24/22 0650  WBC 11.2* 6.8  HGB 14.3 13.0  HCT 45.2 40.8  MCV 99.1 99.3  PLT  200 172     Basic Metabolic Panel: Recent Labs  Lab 04/23/22 1619 04/23/22 1721 04/25/22 0505  NA 140  --  141  K 3.9  --  4.0  CL 105  --  103  CO2 27  --  31  GLUCOSE 142*  --  138*  BUN 18  --  23  CREATININE 0.82  --  0.87  CALCIUM 8.8*  --  8.9  MG  --  2.3  --     GFR: Estimated Creatinine Clearance: 67 mL/min (by C-G formula based  on SCr of 0.87 mg/dL). Recent Labs  Lab 04/23/22 1619 04/24/22 0650  WBC 11.2* 6.8     Liver Function Tests: Recent Labs  Lab 04/23/22 1721 04/23/22 1814  AST 21 23  ALT 23 24  ALKPHOS 62 66  BILITOT 0.6 0.5  PROT 6.7 6.8  ALBUMIN 3.7 3.8    Recent Labs  Lab 04/23/22 1721  LIPASE 48    No results for input(s): "AMMONIA" in the last 168 hours.  ABG    Component Value Date/Time   PHART 7.43 04/08/2022 0912   PCO2ART 65 (H) 04/08/2022 0912   PO2ART 66 (L) 04/08/2022 0912   HCO3 43.1 (H) 04/08/2022 0912   TCO2 36 (H) 10/11/2019 2238   O2SAT 92.5 04/08/2022 0912     Coagulation Profile: No results for input(s): "INR", "PROTIME" in the last 168 hours.  Cardiac Enzymes: No results for input(s): "CKTOTAL", "CKMB", "CKMBINDEX", "TROPONINI" in the last 168 hours.  HbA1C: Hgb A1c MFr Bld  Date/Time Value Ref Range Status  04/08/2022 09:56 AM 5.7 (H) 4.8 - 5.6 % Final    Comment:    (NOTE) Pre diabetes:          5.7%-6.4%  Diabetes:              >6.4%  Glycemic control for   <7.0% adults with diabetes   02/04/2020 04:38 AM 6.4 (H) 4.8 - 5.6 % Final    Comment:    (NOTE) Pre diabetes:          5.7%-6.4% Diabetes:              >6.4% Glycemic control for   <7.0% adults with diabetes     CBG: Recent Labs  Lab 04/24/22 1519  GLUCAP 215*    Review of Systems: Positives in BOLD   Gen: Denies fever, chills, weight change, fatigue, night sweats HEENT: Denies blurred vision, double vision, hearing loss, tinnitus, sinus congestion, rhinorrhea, sore throat, neck stiffness, dysphagia PULM: shortness of breath, cough, sputum production, hemoptysis, wheezing CV: chest discomfort, edema, orthopnea, paroxysmal nocturnal dyspnea, palpitations GI: Denies abdominal pain, nausea, vomiting, diarrhea, hematochezia, melena, constipation, change in bowel habits GU: Denies dysuria, hematuria, polyuria, oliguria, urethral discharge Endocrine: Denies hot or cold  intolerance, polyuria, polyphagia or appetite change Derm: Denies rash, dry skin, scaling or peeling skin change Heme: Denies easy bruising, bleeding, bleeding gums Neuro: Denies headache, numbness, weakness, slurred speech, loss of memory or consciousness  Past Medical History:  She,  has a past medical history of Acute postoperative pain (10/03/2017), Anxiety, BiPAP (biphasic positive airway pressure) dependence, Carpal tunnel syndrome, CHF (congestive heart failure) (HCC), CHF (congestive heart failure) ([place]), Chronic generalized abdominal pain, Chronic rhinitis, COPD (chronic obstructive pulmonary disease) ([place]), DDD (degenerative disc disease), cervical, DDD (degenerative disc disease), lumbosacral, Depression, Dyspnea, Edema, Flu, Gastritis, GERD (gastroesophageal reflux disease), Hematuria, Hernia of abdominal wall (07/17/2018), Hernia, abdominal, Hypertension, Kidney stones, Low back pain, Lumbar  radiculitis, Malodorous urine, Muscle weakness, Obesity, Oxygen dependent, Pneumonia due to COVID-19 virus (02/03/2020), Renal cyst, Sensory urge incontinence, Thyroid activity decreased, Tobacco abuse, and Wheezing.   Surgical History:   Past Surgical History:  Procedure Laterality Date   ABDOMINAL HYSTERECTOMY     CARPAL TUNNEL RELEASE Left 2012   EPIGASTRIC HERNIA REPAIR N/A 08/13/2018   HERNIA REPAIR EPIGASTRIC ADULT; polypropylene mesh reinforcement.  Surgeon: Robert Bellow, MD;  Location: [facility] ORS;  Service: General;  Laterality: N/A;   RIGHT/LEFT HEART CATH AND CORONARY ANGIOGRAPHY N/A 07/11/2019   Procedure: RIGHT/LEFT HEART CATH AND CORONARY ANGIOGRAPHY;  Surgeon: Yolonda Kida, MD;  Location: [facility];  Service: Cardiovascular;  Laterality: N/A;   TONSILLECTOMY     TUBAL LIGATION     UMBILICAL HERNIA REPAIR N/A 08/13/2018   HERNIA REPAIR UMBILICAL ADULT; polypropylene mesh reinforcement Surgeon: Robert Bellow, MD;  Location: [facility] ORS;  Service: General;   Laterality: N/A;     Social History:   reports that she has been smoking cigarettes. She has a 22.00 pack-year smoking history. She has never used smokeless tobacco. She reports current drug use. She reports that she does not drink alcohol.   Family History:  Her family history includes Alcohol abuse in her father; Breast cancer in her sister; Cancer in her brother, brother, and sister; Coronary artery disease in her mother; Heart disease in her father and mother; Lung cancer in her sister; Pneumonia in her brother; Stroke in her mother. There is no history of Prostate cancer, Kidney cancer, or Bladder Cancer.   Allergies Allergies  Allergen Reactions   Gabapentin Other (See Comments)    Higher doses of 800 mg, 600 mg, 300 mg causes excessive sedation and contributes to numerous mechanical falls. (Unsure if 100 mg dose can be tolerated)   Codeine Nausea And Vomiting   Meloxicam Other (See Comments)    Stomach pain     Home Medications  Prior to Admission medications   Medication Sig Start Date End Date Taking? Authorizing Provider  acetaminophen (TYLENOL) 500 MG tablet Take 1,000 mg by mouth every 8 (eight) hours as needed.   Yes [provider]  albuterol (VENTOLIN HFA) 108 (90 Base) MCG/ACT inhaler Inhale 2 puffs into the lungs every 6 (six) hours as needed for wheezing or shortness of breath. 10/23/19  Yes Veronese, [place], MD  ambrisentan (LETAIRIS) 5 MG tablet Take 5 mg by mouth daily. 01/13/22  Yes [provider]  aspirin EC 81 MG tablet Take 81 mg by mouth daily.   Yes [provider]  Besifloxacin HCl (BESIVANCE) 0.6 % SUSP Place 1 drop into the right eye 3 (three) times daily. For 2 weeks starting 03/29/22   Yes [provider]  Bromfenac Sodium (PROLENSA) 0.07 % SOLN Place 1 drop into the right eye at bedtime. For 4 weeks starting 03/29/22   Yes [provider]  butalbital-acetaminophen-caffeine (FIORICET) 50-325-40 MG tablet Take 1  tablet by mouth every 6 (six) hours as needed for headache. LIMIT USE TO PREVENT MEDICATION OVERUSE HEADACHE 04/09/22  Yes Emeterio Reeve, DO  Cholecalciferol 50 MCG (2000 UT) CAPS Take 2,000 Units by mouth daily.    Yes [provider]  dextromethorphan-guaiFENesin (MUCINEX DM) 30-600 MG 12hr tablet Take 1 tablet by mouth 2 (two) times daily as needed for cough. 04/09/22  Yes Emeterio Reeve, DO  Difluprednate 0.05 % EMUL Place 1 drop into the right eye in the morning, at noon, in the evening, and at  bedtime. One drop 4 times daily for one week starting 03/29/22. One drop 3 times daily for one week. One drop 2 times daily for one week. One drop once daily for one week, then stop.   Yes [provider]  Fluticasone-Umeclidin-Vilant 100-62.5-25 MCG/INH AEPB Inhale 1 puff into the lungs daily. 10/03/18  Yes [provider]  HYDROcodone-acetaminophen (NORCO/VICODIN) 5-325 MG tablet Take 1 tablet by mouth every 8 (eight) hours as needed for severe pain. Must last 30 days 05/01/22 05/31/22 Yes Milinda Pointer, MD  ipratropium-albuterol (DUONEB) 0.5-2.5 (3) MG/3ML SOLN USE 1 VIAL VIA NEBULIZER EVERY 6 HOURS AS NEEDED FOR SHORTNESS OF BREATH OR WHEEZING 11/30/20  Yes Chrismon, Vickki Muff, PA-C  levothyroxine (SYNTHROID) 25 MCG tablet TAKE 1 TABLET(25 MCG) BY MOUTH DAILY BEFORE BREAKFAST 01/03/22  Yes Birdie Sons, MD  nicotine (NICODERM CQ - DOSED IN MG/24 HOURS) 21 mg/24hr patch Place 1 patch (21 mg total) onto the skin daily. 04/10/22  Yes Emeterio Reeve, DO  OXYGEN Inhale 2.5 L/min into the lungs.   Yes [provider]  potassium chloride SA (KLOR-CON M) 20 MEQ tablet Take 20-40 mEq by mouth 2 (two) times daily. Take 40 mEq in the morning and 20 mEq in the evening. 04/11/22  Yes [provider]  roflumilast (DALIRESP) 500 MCG TABS tablet Take 500 mcg by mouth daily.   Yes [provider]  sertraline (ZOLOFT) 100 MG tablet TAKE 2 TABLETS BY MOUTH  ONCE EVERY MORNING 04/12/22  Yes Birdie Sons, MD  simvastatin (ZOCOR) 20 MG tablet Take 20 mg by mouth at bedtime. 04/11/22  Yes [provider]  spironolactone (ALDACTONE) 50 MG tablet Take 1 tablet (50 mg total) by mouth daily. 04/10/22  Yes Emeterio Reeve, DO  torsemide (DEMADEX) 20 MG tablet Take 2 tablets (40 mg total) by mouth 2 (two) times daily. Take 4 tablets QAM and 2 tablets QPM Patient taking differently: Take 40-80 mg by mouth 2 (two) times daily. Take 4 tablets QAM and 2 tablets QPM 04/09/22  Yes Emeterio Reeve, DO  HYDROcodone-acetaminophen (NORCO/VICODIN) 5-325 MG tablet Take 1 tablet by mouth every 8 (eight) hours as needed for severe pain. Must last 30 days 05/31/22 06/30/22  Milinda Pointer, MD  HYDROcodone-acetaminophen (NORCO/VICODIN) 5-325 MG tablet Take 1 tablet by mouth every 8 (eight) hours as needed for severe pain. Must last 30 days 06/30/22 07/30/22  Milinda Pointer, MD     Critical care provider statement:   Total critical care time: 33 minutes   Performed by: Lanney Gins MD   Critical care time was exclusive of separately billable procedures and treating other patients.   Critical care was necessary to treat or prevent imminent or life-threatening deterioration.   Critical care was time spent personally by me on the following activities: development of treatment plan with patient and/or surrogate as well as nursing, discussions with consultants, evaluation of patient's response to treatment, examination of patient, obtaining history from patient or surrogate, ordering and performing treatments and interventions, ordering and review of laboratory studies, ordering and review of radiographic studies, pulse oximetry and re-evaluation of patient's condition.    Ottie Glazier, M.D.  Pulmonary & Critical Care Medicine

## 2022-04-25 NOTE — TOC Benefit Eligibility Note (Signed)
Patient Teacher, English as a foreign language completed.    The patient is currently admitted and upon discharge could be taking Xarelto 20 mg.  The current 30 day co-pay is, $0.00.   The patient is insured through [employer], [place] Patient Advocate Specialist [place] Patient Advocate Team Direct Number: [phone]  Fax: [phone]

## 2022-04-25 NOTE — Progress Notes (Signed)
PHARMACIST - PHYSICIAN ORDER COMMUNICATION  CONCERNING: amiodarone and Simvastatin  and risk of rhabdomyolysis  DESCRIPTION:  Patients on amiodarone and simvastatin >10 mg/day have reported cases of rhabdomyolysis. Pharmacy is to assess simvastatin dose. If >10 mg, substitute atorvastatin (Lipitor) 18m for each 255msimvastatin.  This patient is ordered simvastatin 2095mnd amiodarone IV     ACTION TAKEN: Per protocol pharmacy has discontinued the patient's order for simvastatin and replaced it with Atorvastatin 8m85m RodnVallery SaarmD, BCPS

## 2022-04-25 NOTE — Progress Notes (Signed)
[facility] Cardiology  CARDIOLOGY CONSULT NOTE  Patient ID: Theresa [name] MRN: [id number] DOB/AGE: 1955-11-25 66 y.o.  Admit date: 04/23/2022 Referring Physician Cordelia Poche Primary Physician Caryn Section, Kirstie Peri, MD Primary Cardiologist [facility] Reason for Consultation New onset atrial flutter/fibrillation  HPI:  Theresa [name] is a 66 year old female with moderate pulmonary hypertension, COPD on 2.5 L, ongoing tobacco use, OSA on BiPAP for hypercapnia, chronic pain syndrome who presents with fatigue and shortness of breath.  She was discovered to be in atrial flutter with a heart rate of 150 prompting cardiology consultation.  Interval History: - amiodarone stopped overnight due to hypotension, went back into atrial flutter with rate 130-140 thereafter - feels like her breathing is a little better, moving a little more air but still wheezing - feels anxious and jittery but no chest pain   Review of systems complete and found to be negative unless listed above     Past Medical History:  Diagnosis Date   Acute postoperative pain 10/03/2017   Anxiety    BiPAP (biphasic positive airway pressure) dependence    at hs   Carpal tunnel syndrome    CHF (congestive heart failure) ([place])    1/18   CHF (congestive heart failure) ([facility])    Chronic generalized abdominal pain    Chronic rhinitis    COPD (chronic obstructive pulmonary disease) ([facility])    DDD (degenerative disc disease), cervical    DDD (degenerative disc disease), lumbosacral    Depression    Dyspnea    Edema    Flu    1/18   Gastritis    GERD (gastroesophageal reflux disease)    Hematuria    Hernia of abdominal wall 07/17/2018   Hernia, abdominal    Hypertension    Kidney stones    Low back pain    Lumbar radiculitis    Malodorous urine    Muscle weakness    Obesity    Oxygen dependent    Pneumonia due to COVID-19 virus 02/03/2020   Renal cyst    Sensory urge incontinence    Thyroid activity decreased    9/19    Tobacco abuse    Wheezing     Past Surgical History:  Procedure Laterality Date   ABDOMINAL HYSTERECTOMY     CARPAL TUNNEL RELEASE Left 2012   EPIGASTRIC HERNIA REPAIR N/A 08/13/2018   HERNIA REPAIR EPIGASTRIC ADULT; polypropylene mesh reinforcement.  Surgeon: Robert Bellow, MD;  Location: [facility] ORS;  Service: General;  Laterality: N/A;   RIGHT/LEFT HEART CATH AND CORONARY ANGIOGRAPHY N/A 07/11/2019   Procedure: RIGHT/LEFT HEART CATH AND CORONARY ANGIOGRAPHY;  Surgeon: Yolonda Kida, MD;  Location: [facility];  Service: Cardiovascular;  Laterality: N/A;   TONSILLECTOMY     TUBAL LIGATION     UMBILICAL HERNIA REPAIR N/A 08/13/2018   HERNIA REPAIR UMBILICAL ADULT; polypropylene mesh reinforcement Surgeon: Robert Bellow, MD;  Location: [facility] ORS;  Service: General;  Laterality: N/A;    Medications Prior to Admission  Medication Sig Dispense Refill Last Dose   acetaminophen (TYLENOL) 500 MG tablet Take 1,000 mg by mouth every 8 (eight) hours as needed.   prn at prn   albuterol (VENTOLIN HFA) 108 (90 Base) MCG/ACT inhaler Inhale 2 puffs into the lungs every 6 (six) hours as needed for wheezing or shortness of breath. 8 g 1 prn at prn   ambrisentan (LETAIRIS) 5 MG tablet Take 5 mg by mouth daily.   04/23/2022 at 1000   aspirin EC  81 MG tablet Take 81 mg by mouth daily.   04/23/2022 at 1000   Besifloxacin HCl (BESIVANCE) 0.6 % SUSP Place 1 drop into the right eye 3 (three) times daily. For 2 weeks starting 03/29/22   04/23/2022 at 1000   Bromfenac Sodium (PROLENSA) 0.07 % SOLN Place 1 drop into the right eye at bedtime. For 4 weeks starting 03/29/22   04/23/2022 at 1000   butalbital-acetaminophen-caffeine (FIORICET) 50-325-40 MG tablet Take 1 tablet by mouth every 6 (six) hours as needed for headache. LIMIT USE TO PREVENT MEDICATION OVERUSE HEADACHE 14 tablet 0 prn at prn   Cholecalciferol 50 MCG (2000 UT) CAPS Take 2,000 Units by mouth daily.    04/23/2022 at 1000    dextromethorphan-guaiFENesin (MUCINEX DM) 30-600 MG 12hr tablet Take 1 tablet by mouth 2 (two) times daily as needed for cough.   prn at prn   Difluprednate 0.05 % EMUL Place 1 drop into the right eye in the morning, at noon, in the evening, and at bedtime. One drop 4 times daily for one week starting 03/29/22. One drop 3 times daily for one week. One drop 2 times daily for one week. One drop once daily for one week, then stop.   04/23/2022 at 1000   Fluticasone-Umeclidin-Vilant 100-62.5-25 MCG/INH AEPB Inhale 1 puff into the lungs daily.   04/23/2022 at 1000   [START ON 05/01/2022] HYDROcodone-acetaminophen (NORCO/VICODIN) 5-325 MG tablet Take 1 tablet by mouth every 8 (eight) hours as needed for severe pain. Must last 30 days 90 tablet 0 prn at prn   ipratropium-albuterol (DUONEB) 0.5-2.5 (3) MG/3ML SOLN USE 1 VIAL VIA NEBULIZER EVERY 6 HOURS AS NEEDED FOR SHORTNESS OF BREATH OR WHEEZING 1080 mL 0 prn at prn   levothyroxine (SYNTHROID) 25 MCG tablet TAKE 1 TABLET(25 MCG) BY MOUTH DAILY BEFORE BREAKFAST 90 tablet 1 04/23/2022 at 1000   nicotine (NICODERM CQ - DOSED IN MG/24 HOURS) 21 mg/24hr patch Place 1 patch (21 mg total) onto the skin daily. 28 patch 0 Past Week at unknown   OXYGEN Inhale 2.5 L/min into the lungs.   04/23/2022 at unknown   potassium chloride SA (KLOR-CON M) 20 MEQ tablet Take 20-40 mEq by mouth 2 (two) times daily. Take 40 mEq in the morning and 20 mEq in the evening.   04/23/2022 at 1000   roflumilast (DALIRESP) 500 MCG TABS tablet Take 500 mcg by mouth daily.   04/23/2022 at 1000   sertraline (ZOLOFT) 100 MG tablet TAKE 2 TABLETS BY MOUTH ONCE EVERY MORNING 180 tablet 1 04/23/2022 at 1000   simvastatin (ZOCOR) 20 MG tablet Take 20 mg by mouth at bedtime.   04/22/2022 at 2200   spironolactone (ALDACTONE) 50 MG tablet Take 1 tablet (50 mg total) by mouth daily. 30 tablet 0 04/23/2022 at 1000   torsemide (DEMADEX) 20 MG tablet Take 2 tablets (40 mg total) by mouth 2 (two) times daily. Take 4 tablets  QAM and 2 tablets QPM (Patient taking differently: Take 40-80 mg by mouth 2 (two) times daily. Take 4 tablets QAM and 2 tablets QPM)   04/23/2022 at 1000   [START ON 05/31/2022] HYDROcodone-acetaminophen (NORCO/VICODIN) 5-325 MG tablet Take 1 tablet by mouth every 8 (eight) hours as needed for severe pain. Must last 30 days 90 tablet 0    [START ON 06/30/2022] HYDROcodone-acetaminophen (NORCO/VICODIN) 5-325 MG tablet Take 1 tablet by mouth every 8 (eight) hours as needed for severe pain. Must last 30 days 90 tablet 0  Social History   Socioeconomic History   Marital status: Married    Spouse name: Ovid Curd   Number of children: 2   Years of education: Not on file   Highest education level: 12th grade  Occupational History   Occupation: disability  Tobacco Use   Smoking status: Every Day    Packs/day: 0.50    Years: 44.00    Total pack years: 22.00    Types: Cigarettes   Smokeless tobacco: Never   Tobacco comments:    over a half pack  Vaping Use   Vaping Use: Never used  Substance and Sexual Activity   Alcohol use: No   Drug use: Yes    Comment: prescribed hydrocodone   Sexual activity: Not Currently    Birth control/protection: Surgical  Other Topics Concern   Not on file  Social History Narrative   ** Merged History Encounter **       Lives with spouse    Social Determinants of Health   Financial Resource Strain: Low Risk  (09/20/2021)   Overall Financial Resource Strain (CARDIA)    Difficulty of Paying Living Expenses: Not very hard  Food Insecurity: No Food Insecurity (12/14/2021)   Hunger Vital Sign    Worried About Running Out of Food in the Last Year: Never true    Ran Out of Food in the Last Year: Never true  Transportation Needs: No Transportation Needs (12/14/2021)   PRAPARE - Transportation    Lack of Transportation (Medical): No    Lack of Transportation (Non-Medical): No  Physical Activity: Inactive (09/20/2021)   Exercise Vital Sign    Days of Exercise per  Week: 0 days    Minutes of Exercise per Session: 0 min  Stress: No Stress Concern Present (09/20/2021)   [place]    Feeling of Stress : Not at all  Social Connections: Moderately Integrated (09/20/2021)   Social Connection and Isolation Panel [NHANES]    Frequency of Communication with Friends and Family: More than three times a week    Frequency of Social Gatherings with Friends and Family: Once a week    Attends Religious Services: More than 4 times per year    Active Member of [employer] or Organizations: No    Attends Archivist Meetings: Never    Marital Status: Married  Human resources officer Violence: Not At Risk (01/22/2020)   Humiliation, Afraid, Rape, and Kick questionnaire    Fear of Current or Ex-Partner: No    Emotionally Abused: No    Physically Abused: No    Sexually Abused: No    Family History  Problem Relation Age of Onset   Heart disease Mother    Stroke Mother    Coronary artery disease Mother    Lung cancer Sister    Cancer Sister    Breast cancer Sister    Alcohol abuse Father    Heart disease Father    Cancer Brother    Cancer Brother    Pneumonia Brother    Prostate cancer Neg Hx    Kidney cancer Neg Hx    Bladder Cancer Neg Hx      PHYSICAL EXAM  General: Pleasant elderly caucasian female, well nourished, in no acute distress HEENT:  Normocephalic and atraumatic Neck:  No JVD.  Lungs: Significant wheezing present throughout. [place] oxygen in place.  Heart: Irregularly irregular . Normal S1 and S2 without gallops or murmurs.  Abdomen: Obese appearing Msk:  Normal strength  and tone for age. Extremities: No clubbing, cyanosis or edema.   Neuro: Alert and oriented X 3. Psych:  Good affect, responds appropriately  Labs:   Lab Results  Component Value Date   WBC 6.8 04/24/2022   HGB 13.0 04/24/2022   HCT 40.8 04/24/2022   MCV 99.3 04/24/2022   PLT 172 04/24/2022    Recent Labs   Lab 04/23/22 1814 04/25/22 0505  NA  --  141  K  --  4.0  CL  --  103  CO2  --  31  BUN  --  23  CREATININE  --  0.87  CALCIUM  --  8.9  PROT 6.8  --   BILITOT 0.5  --   ALKPHOS 66  --   ALT 24  --   AST 23  --   GLUCOSE  --  138*    Lab Results  Component Value Date   CKTOTAL 85 10/11/2019   CKMB 1.0 12/27/2012   TROPONINI <0.03 02/07/2018    Lab Results  Component Value Date   CHOL 175 04/08/2022   CHOL 241 (H) 06/10/2021   Lab Results  Component Value Date   HDL 58 04/08/2022   HDL 36 (L) 06/10/2021   Lab Results  Component Value Date   LDLCALC 86 04/08/2022   LDLCALC 171 (H) 06/10/2021   Lab Results  Component Value Date   TRIG 154 (H) 04/08/2022   TRIG 184 (H) 06/10/2021   Lab Results  Component Value Date   CHOLHDL 3.0 04/08/2022   No results found for: "LDLDIRECT"    Radiology: [facility] Chest Port 1 View  Result Date: 04/24/2022 CLINICAL DATA:  Shortness of breath. EXAM: PORTABLE CHEST 1 VIEW COMPARISON:  Chest x-ray 04/23/2022. FINDINGS: Similar mild diffuse interstitial prominence. No consolidation. No visible pleural effusions or pneumothorax. Similar cardiomegaly and pulmonary vascular congestion. IMPRESSION: 1. Similar mild diffuse interstitial prominence, which could represent mild interstitial edema versus the sequela of recurrent bouts of CHF. 2. Similar cardiomegaly and pulmonary vascular congestion. Electronically Signed   By: Margaretha Sheffield M.D.   On: 04/24/2022 14:07   DG Chest 2 View  Result Date: 04/23/2022 CLINICAL DATA:  Chest pain EXAM: CHEST - 2 VIEW COMPARISON:  04/05/2022 FINDINGS: Cardiomegaly. Both lungs are clear. Emphysema. Disc degenerative disease of the thoracic spine. IMPRESSION: 1.  Cardiomegaly without acute abnormality of the lungs. 2.  Emphysema. Electronically Signed   By: Delanna Ahmadi M.D.   On: 04/23/2022 16:43   CT Angio Chest Pulmonary Embolism (PE) W or WO Contrast  Result Date: 04/09/2022 CLINICAL DATA:  PE  suspected, tachycardia EXAM: CT ANGIOGRAPHY CHEST WITH CONTRAST TECHNIQUE: Multidetector CT imaging of the chest was performed using the standard protocol during bolus administration of intravenous contrast. Multiplanar CT image reconstructions and MIPs were obtained to evaluate the vascular anatomy. RADIATION DOSE REDUCTION: This exam was performed according to the departmental dose-optimization program which includes automated exposure control, adjustment of the mA and/or kV according to patient size and/or use of iterative reconstruction technique. CONTRAST:  88m OMNIPAQUE IOHEXOL 350 MG/ML SOLN COMPARISON:  None Available. FINDINGS: Cardiovascular: Examination for pulmonary embolism is limited by breath motion artifact within this limitation, no evidence of pulmonary embolism through the proximal segmental pulmonary arterial level no evidence of pulmonary embolism. Mild cardiomegaly three-vessel coronary artery calcifications. No pericardial effusion. Aortic atherosclerosis. Mediastinum/Nodes: No enlarged mediastinal, hilar, or axillary lymph nodes. Thyroid gland, trachea, and esophagus demonstrate no significant findings. Lungs/Pleura: Moderate emphysema. Near-total atelectasis or  consolidation of the right middle lobe (series 6, image 57). Bandlike scarring or atelectasis of the bilateral lung bases. No pleural effusion or pneumothorax. Upper Abdomen: No acute abnormality. Multiple low-attenuation lesions throughout the liver, incompletely characterized although likely benign simple cysts. Musculoskeletal: No chest wall abnormality. No acute osseous findings. Review of the MIP images confirms the above findings. IMPRESSION: 1. Examination for pulmonary embolism is limited by breath motion artifact. Within this limitation, no evidence of pulmonary embolism through the proximal segmental pulmonary arterial level. 2. Near-total atelectasis or consolidation of the right middle lobe, of uncertain chronicity.  Consider bronchoscopic evaluation. 3. Additional bandlike scarring or atelectasis of the bilateral lung bases. 4. Moderate emphysema. 5. Coronary artery disease. Aortic Atherosclerosis (ICD10-I70.0) and Emphysema (ICD10-J43.9). Electronically Signed   By: Delanna Ahmadi M.D.   On: 04/09/2022 15:13   MR BRAIN WO CONTRAST  Result Date: 04/08/2022 CLINICAL DATA:  Initial evaluation for neuro deficit, stroke suspected. EXAM: MRI HEAD WITHOUT CONTRAST TECHNIQUE: Multiplanar, multiecho pulse sequences of the brain and surrounding structures were obtained without intravenous contrast. COMPARISON:  Prior study from earlier the same day. FINDINGS: Brain: Cerebral volume within normal limits. Patchy and hazy T2/FLAIR hyperintensity involving the periventricular it deep white matter both cerebral hemispheres as well as the pons, most consistent with chronic small vessel ischemic disease, moderately advanced in nature. Prominent involvement of the bilateral basal ganglia and thalami noted as well. No evidence for acute or subacute infarct. Gray-white matter differentiation maintained. No areas of chronic cortical infarction. No acute or chronic intracranial blood products. No mass lesion, midline shift or mass effect. No hydrocephalus or extra-axial fluid collection. Pituitary gland suprasellar region within normal limits. Vascular: Major intracranial vascular flow voids are maintained. Skull and upper cervical spine: Mild cerebellar tonsillar ectopia without frank Chiari malformation. Bone marrow signal intensity within normal limits. No scalp soft tissue abnormality. Sinuses/Orbits: Prior bilateral ocular lens replacement. Paranasal sinuses are largely clear. No significant mastoid effusion. Other: None. IMPRESSION: 1. No acute intracranial abnormality. 2. Moderately advanced chronic microvascular ischemic disease for age. Electronically Signed   By: Jeannine Boga M.D.   On: 04/08/2022 23:53   CT ANGIO HEAD NECK W  WO CM  Result Date: 04/08/2022 CLINICAL DATA:  Acute neuro deficit. EXAM: CT ANGIOGRAPHY HEAD AND NECK TECHNIQUE: Multidetector CT imaging of the head and neck was performed using the standard protocol during bolus administration of intravenous contrast. Multiplanar CT image reconstructions and MIPs were obtained to evaluate the vascular anatomy. Carotid stenosis measurements (when applicable) are obtained utilizing NASCET criteria, using the distal internal carotid diameter as the denominator. RADIATION DOSE REDUCTION: This exam was performed according to the departmental dose-optimization program which includes automated exposure control, adjustment of the mA and/or kV according to patient size and/or use of iterative reconstruction technique. CONTRAST:  70m OMNIPAQUE IOHEXOL 350 MG/ML SOLN COMPARISON:  CT head 04/01/2022 FINDINGS: CT HEAD FINDINGS Brain: Ventricle size normal. Hypodensity in the frontal white matter bilaterally unchanged. Negative for acute infarct, acute hemorrhage, or mass. Vascular: Negative for hyperdense vessel Skull: Negative Sinuses: Paranasal sinuses clear. Orbits: Bilateral cataract extraction. Review of the MIP images confirms the above findings CTA NECK FINDINGS Aortic arch: Standard branching. Imaged portion shows no evidence of aneurysm or dissection. No significant stenosis of the major arch vessel origins. Mild atherosclerotic disease aortic arch and proximal great vessels Right carotid system: Mild atherosclerotic disease at the right carotid bifurcation without stenosis. Left carotid system: Mild atherosclerotic calcification left carotid bifurcation without stenosis Vertebral  arteries: Both vertebral arteries are widely patent to the skull base without stenosis Skeleton: Mild cervical spondylosis. No acute skeletal abnormality. Periapical lucency around right upper molar. Other neck: Negative for mass or adenopathy Upper chest: Mild apical emphysema.  No acute abnormality  Review of the MIP images confirms the above findings CTA HEAD FINDINGS Anterior circulation: Atherosclerotic calcification and mild stenosis in the cavernous carotid bilaterally. Anterior and middle cerebral arteries widely patent without stenosis or aneurysm. Posterior circulation: Both vertebral arteries patent to the basilar. PICA patent bilaterally. Basilar widely patent. AICA, superior cerebellar, posterior cerebral arteries patent. No stenosis or aneurysm. Fetal origin posterior cerebral artery bilaterally. Venous sinuses: Normal venous enhancement Anatomic variants: None Review of the MIP images confirms the above findings IMPRESSION: 1. CT head negative for acute abnormality. Chronic white matter changes bilaterally due to chronic ischemia. 2. Mild atherosclerotic disease carotid bifurcation bilaterally. No carotid or vertebral artery stenosis in the neck. Mild atherosclerotic stenosis in the cavernous carotid bilaterally. 3. No intracranial large vessel occlusion or flow limiting stenosis Electronically Signed   By: Franchot Gallo M.D.   On: 04/08/2022 11:28   MR CERVICAL SPINE WO CONTRAST  Result Date: 04/07/2022 CLINICAL DATA:  Cervical myelopathy EXAM: MRI CERVICAL SPINE WITHOUT CONTRAST TECHNIQUE: Multiplanar, multisequence MR imaging of the cervical spine was performed. No intravenous contrast was administered. COMPARISON:  12/25/2007 MRI cervical spine 10/11/2019 CT cervical spine FINDINGS: Evaluation is somewhat limited by motion artifact. Alignment: Straightening and mild reversal of the normal cervical lordosis. No listhesis. Vertebrae: No acute fracture or suspicious osseous lesion. T1 and T2 hyperintense focus in the C5 vertebral body, most likely a benign hemangioma. Vertebral body heights are preserved. Congenitally short pedicles, which narrow the AP diameter of the spinal canal. Cord: Normal signal and morphology. Previously noted prominence of the central canal at the level of C7 is no  longer seen. Posterior Fossa, vertebral arteries, paraspinal tissues: Negative. Disc levels: C2-C3: No significant disc bulge. No spinal canal stenosis or neural foraminal narrowing. C3-C4: Minimal disc bulge. Facet and uncovertebral hypertrophy, left-greater-than-right. No spinal canal stenosis. Mild-to-moderate right and severe left neural foraminal narrowing, which have progressed from the prior MRI . C4-C5: No significant disc bulge. Facet and uncovertebral hypertrophy. Moderate left and mild right neural foraminal narrowing, which have progressed from the prior exam. C5-C6: Minimal disc bulge. Ligamentum flavum hypertrophy. Facet and uncovertebral hypertrophy. Mild spinal canal stenosis, with severe right and moderate to severe left neural foraminal narrowing, which have progressed from the prior exam. C6-C7: Disc height loss and minimal disc bulge. Facet and uncovertebral hypertrophy. Ligamentum flavum hypertrophy. Mild spinal canal stenosis overall unchanged. Mild-to-moderate right and severe left neural foraminal narrowing, which have progressed from prior exam. C7-T1: No significant disc bulge. No spinal canal stenosis or neural foraminal narrowing. IMPRESSION: 1. Evaluation is limited by motion artifact. Within this limitation, there progressive degenerative changes compared to 2009, with mild spinal canal stenosis C4-C5 and C5-C6. 2. Multilevel facet and uncovertebral hypertrophy, which causes up to severe neural foraminal narrowing, which is seen on the left at C3-C4 and C6-C7 and on the right at C5-C6. Additional less significant neural foraminal narrowing, as described above. Electronically Signed   By: Merilyn Baba M.D.   On: 04/07/2022 00:59   DG Chest Port 1 View  Result Date: 04/05/2022 CLINICAL DATA:  Shortness of breath, hypoxia EXAM: PORTABLE CHEST 1 VIEW COMPARISON:  04/01/2022 FINDINGS: No significant change in AP portable examination. Cardiomegaly with mild, diffuse interstitial opacity  and  pulmonary vascular prominence. The visualized skeletal structures are unremarkable. IMPRESSION: No significant change in AP portable examination. Cardiomegaly with mild, diffuse interstitial opacity and pulmonary vascular prominence, findings most likely reflect edema. No new or focal airspace opacity. Electronically Signed   By: Delanna Ahmadi M.D.   On: 04/05/2022 09:32   MR BRAIN WO CONTRAST  Result Date: 04/04/2022 CLINICAL DATA:  Occipital region headache and scalp tenderness. EXAM: MRI HEAD WITHOUT CONTRAST TECHNIQUE: Multiplanar, multiecho pulse sequences of the brain and surrounding structures were obtained without intravenous contrast. COMPARISON:  Head CT 04/01/2022. Head MRI 10/11/2019 and 12/10/2017. FINDINGS: Brain: There is no evidence of an acute infarct, intracranial hemorrhage, mass, midline shift, or extra-axial fluid collection. Patchy T2 hyperintensities in the cerebral white matter, basal ganglia, thalami, and brainstem are stable to mildly increased compared to the prior MRI and are nonspecific but compatible with moderate chronic small vessel ischemic disease. The ventricles and sulci are within normal limits for age. Vascular: Major intracranial vascular flow voids are preserved. Skull and upper cervical spine: Diffuse bone marrow heterogeneity including increased trace diffusion weighted signal intensity, chronic based on the 2019 MRI and nonspecific. No discrete focal lesion. Sinuses/Orbits: Bilateral cataract extraction. Paranasal sinuses and mastoid air cells are clear. Other: None. IMPRESSION: 1. No acute intracranial abnormality. 2. Moderate chronic small vessel ischemic disease. Electronically Signed   By: Logan Bores M.D.   On: 04/04/2022 14:30   ECHOCARDIOGRAM COMPLETE  Result Date: 04/02/2022    ECHOCARDIOGRAM REPORT   Patient Name:   [facility] Date of Exam: 04/02/2022 Medical Rec #:  [id number]      Height:       62.0 in Accession #:    [id number]     Weight:        202.6 lb Date of Birth:  27-Aug-1956       BSA:          1.922 m Patient Age:    46 years       BP:           101/58 mmHg Patient Gender: F              HR:           93 bpm. Exam Location:  [facility] Procedure: 2D Echo Indications:     CHF I50.31  History:         Patient has prior history of Echocardiogram examinations, most                  recent 08/08/2021.  Sonographer:     Kathlen Brunswick RDCS Referring Phys:  [id number] Soledad Gerlach NIU Diagnosing Phys: Yolonda Kida MD IMPRESSIONS  1. Left ventricular ejection fraction, by estimation, is 55 to 60%. The left ventricle has normal function. The left ventricle has no regional wall motion abnormalities. Left ventricular diastolic parameters are consistent with Grade I diastolic dysfunction (impaired relaxation).  2. Right ventricular systolic function is low normal. The right ventricular size is mildly enlarged.  3. Left atrial size was mildly dilated.  4. Right atrial size was mildly dilated.  5. The mitral valve is normal in structure. Trivial mitral valve regurgitation.  6. The aortic valve is grossly normal. Aortic valve regurgitation is not visualized. Conclusion(s)/Recommendation(s): Poor windows for evaluation of left ventricular function by transthoracic echocardiography. Would recommend an alternative means of evaluation. FINDINGS  Left Ventricle: Left ventricular ejection fraction, by estimation, is 55 to 60%. The left ventricle has normal function. The left ventricle has  no regional wall motion abnormalities. The left ventricular internal cavity size was normal in size. There is  no left ventricular hypertrophy. Left ventricular diastolic parameters are consistent with Grade I diastolic dysfunction (impaired relaxation). Right Ventricle: The right ventricular size is mildly enlarged. No increase in right ventricular wall thickness. Right ventricular systolic function is low normal. Left Atrium: Left atrial size was mildly dilated. Right Atrium: Right atrial size was  mildly dilated. Pericardium: There is no evidence of pericardial effusion. Mitral Valve: The mitral valve is normal in structure. Trivial mitral valve regurgitation. Tricuspid Valve: The tricuspid valve is normal in structure. Tricuspid valve regurgitation is trivial. Aortic Valve: The aortic valve is grossly normal. Aortic valve regurgitation is not visualized. Aortic valve mean gradient measures 8.0 mmHg. Aortic valve peak gradient measures 15.1 mmHg. Aortic valve area, by VTI measures 2.58 cm. Pulmonic Valve: The pulmonic valve was normal in structure. Pulmonic valve regurgitation is not visualized. Aorta: The ascending aorta was not well visualized. IAS/Shunts: No atrial level shunt detected by color flow Doppler.  LEFT VENTRICLE PLAX 2D LVIDd:         4.55 cm      Diastology LVIDs:         3.12 cm      LV e' medial:    9.90 cm/s LV PW:         1.10 cm      LV E/e' medial:  10.0 LV IVS:        0.85 cm      LV e' lateral:   12.30 cm/s LVOT diam:     2.10 cm      LV E/e' lateral: 8.1 LV SV:         95 LV SV Index:   49 LVOT Area:     3.46 cm  LV Volumes (MOD) LV vol d, MOD A4C: 105.0 ml LV vol s, MOD A4C: 39.0 ml LV SV MOD A4C:     105.0 ml RIGHT VENTRICLE RV Basal diam:  4.04 cm RV S prime:     14.40 cm/s TAPSE (M-mode): 2.8 cm LEFT ATRIUM             Index        RIGHT ATRIUM           Index LA diam:        4.30 cm 2.24 cm/m   RA Area:     29.70 cm LA Vol (A2C):   97.2 ml 50.57 ml/m  RA Volume:   110.00 ml 57.23 ml/m LA Vol (A4C):   45.2 ml 23.51 ml/m LA Biplane Vol: 66.4 ml 34.54 ml/m  AORTIC VALVE                     PULMONIC VALVE AV Area (Vmax):    2.50 cm      PV Vmax:       1.06 m/s AV Area (Vmean):   2.45 cm      PV Peak grad:  4.5 mmHg AV Area (VTI):     2.58 cm AV Vmax:           194.00 cm/s AV Vmean:          128.000 cm/s AV VTI:            0.367 m AV Peak Grad:      15.1 mmHg AV Mean Grad:      8.0 mmHg LVOT Vmax:  140.00 cm/s LVOT Vmean:        90.700 cm/s LVOT VTI:          0.273 m  LVOT/AV VTI ratio: 0.74  AORTA Ao Root diam: 3.00 cm MITRAL VALVE                TRICUSPID VALVE MV Area (PHT): 6.27 cm     TV Peak grad:   43.3 mmHg MV Decel Time: 121 msec     TV Vmax:        3.29 m/s MV E velocity: 99.40 cm/s MV A velocity: 116.00 cm/s  SHUNTS MV E/A ratio:  0.86         Systemic VTI:  0.27 m                             Systemic Diam: 2.10 cm Dwayne Prince Rome MD Electronically signed by Yolonda Kida MD Signature Date/Time: 04/02/2022/3:26:17 PM    Final    CT HEAD WO CONTRAST (5MM)  Result Date: 04/01/2022 CLINICAL DATA:  66 year old female presents with new or worsening headache. EXAM: CT HEAD WITHOUT CONTRAST TECHNIQUE: Contiguous axial images were obtained from the base of the skull through the vertex without intravenous contrast. RADIATION DOSE REDUCTION: This exam was performed according to the departmental dose-optimization program which includes automated exposure control, adjustment of the mA and/or kV according to patient size and/or use of iterative reconstruction technique. COMPARISON:  Comparison is made with October 11, 2019. FINDINGS: Brain: No evidence of acute infarction, hemorrhage, hydrocephalus, extra-axial collection or mass lesion/mass effect. Signs of mild atrophy and chronic microvascular ischemic change which is similar to previous imaging Vascular: No hyperdense vessel or unexpected calcification. Skull: Normal. Negative for fracture or focal lesion. Sinuses/Orbits: Visualized paranasal sinuses and orbits show no acute process to the extent evaluated. Other: None IMPRESSION: 1. No acute intracranial abnormality. 2. Signs of mild chronic microvascular ischemic change which is similar to previous imaging. Electronically Signed   By: Zetta Bills M.D.   On: 04/01/2022 14:07   DG Chest Port 1 View  Result Date: 04/01/2022 CLINICAL DATA:  Questionable sepsis.  Shortness of breath. EXAM: PORTABLE CHEST 1 VIEW COMPARISON:  Chest radiographs 08/06/2021 FINDINGS:  The cardiac silhouette is upper limits of normal in size. There is unchanged enlargement of the pulmonary arteries. Aortic atherosclerosis is noted. The lungs are hyperinflated with chronic peribronchial thickening and underlying emphysema. The interstitial markings are slightly more prominent diffusely compared to the prior study. No focal airspace consolidation, sizeable pleural effusion, or pneumothorax is identified. No acute osseous abnormality is seen. IMPRESSION: COPD with superimposed mild edema or infection not excluded. Electronically Signed   By: Logan Bores M.D.   On: 04/01/2022 08:31    EKG: Atrial flutter at a rate of 155 bpm.  Unclear if flutter waves are contributing to ST and T wave changes throughout.  Repeat EKG shows atrial fibrillation with a rate of 100.  Nonspecific T wave abnormalities.  Prolonged QTc.  ASSESSMENT AND PLAN:  Theresa [name] is a 66 year old female with moderate pulmonary hypertension, COPD on 2.5 L, ongoing tobacco use, OSA on BiPAP for hypercapnia, chronic pain syndrome who presents with fatigue and shortness of breath.  She was discovered to be in atrial flutter with a heart rate of 150 prompting cardiology consultation.  # New onset atrial fibrillation/flutter Presented with fatigue and shortness of breath discovered to be in atrial flutter with a rate  of 150.  She converted to atrial fibrillation that has been rate controlled with diltiazem.  -CHA2DS2-VASc score = 3 (CHF, HTN, VASC) - continue Xarelto for stroke risk redcution -s/p IV diltiazem  and s/p diltiazem 120 mg daily for rate control and follow closely, amiodarone gtt started yesterday afternoon, stopped at 3am due to hypotension and is not on any rate or rhythm control - went back into atrial flutter with rates 130-140 and feels anxious like when she came in  - check an EKG to assess QTC, then likely give another amiodarone bolus and start drip  - possibly add low dose metoprolol is BP  allows  #Nonobstructive CAD Underwent heart catheterization in 2020 for evaluation of HFpEF and shortness of breath.  During this time was discovered to have a 50% mid LAD lesion which was felt to be nonsignificant. -Okay to hold aspirin in the setting of Xarelto use -Continue simvastatin 20 mg daily - LDL goal < 70  #HFpEF #Moderate pulmonary hypertension She does have some issues with volume overload chronically and takes spironolactone and torsemide for this.  This is all likely related to COPD and tobacco use as well as pulmonary hypertension. -Continue home spironolactone milligrams -Continue home torsemide  This patient's plan of care was discussed and created with Dr. Donnelly Angelica and he is in agreement.    Signed: Tristan Schroeder , PA-C 04/25/2022, 8:45 AM

## 2022-04-26 ENCOUNTER — Inpatient Hospital Stay: Payer: Medicare HMO

## 2022-04-26 DIAGNOSIS — I5032 Chronic diastolic (congestive) heart failure: Secondary | ICD-10-CM | POA: Diagnosis not present

## 2022-04-26 DIAGNOSIS — J9621 Acute and chronic respiratory failure with hypoxia: Secondary | ICD-10-CM | POA: Diagnosis not present

## 2022-04-26 DIAGNOSIS — J441 Chronic obstructive pulmonary disease with (acute) exacerbation: Secondary | ICD-10-CM | POA: Diagnosis not present

## 2022-04-26 DIAGNOSIS — I4892 Unspecified atrial flutter: Secondary | ICD-10-CM | POA: Diagnosis not present

## 2022-04-26 LAB — BASIC METABOLIC PANEL
Anion gap: 7 (ref 5–15)
BUN: 28 mg/dL — ABNORMAL HIGH (ref 8–23)
CO2: 31 mmol/L (ref 22–32)
Calcium: 9.1 mg/dL (ref 8.9–10.3)
Chloride: 100 mmol/L (ref 98–111)
Creatinine, Ser: 0.82 mg/dL (ref 0.44–1.00)
GFR, Estimated: >60 mL/min (ref >60)
Glucose, Bld: 145 mg/dL — ABNORMAL HIGH (ref 70–99)
Potassium: 4.6 mmol/L (ref 3.5–5.1)
Sodium: 138 mmol/L (ref 135–145)

## 2022-04-26 LAB — BRAIN NATRIURETIC PEPTIDE: B Natriuretic Peptide: 187.7 pg/mL — ABNORMAL HIGH (ref 0.0–100.0)

## 2022-04-26 LAB — PHOSPHORUS: Phosphorus: 3 mg/dL (ref 2.5–4.6)

## 2022-04-26 LAB — DIGOXIN LEVEL: Digoxin Level: 3.4 ng/mL (ref 0.8–2.0)

## 2022-04-26 LAB — MAGNESIUM: Magnesium: 3.1 mg/dL — ABNORMAL HIGH (ref 1.7–2.4)

## 2022-04-26 MED ORDER — DIGOXIN 0.25 MG/ML IJ SOLN
0.5000 mg | Freq: Once | INTRAMUSCULAR | Status: AC
  Administered 2022-04-26: 0.5 mg via INTRAVENOUS
  Filled 2022-04-26: qty 2

## 2022-04-26 MED ORDER — DIGOXIN 0.25 MG/ML IJ SOLN
0.1250 mg | Freq: Every day | INTRAMUSCULAR | Status: DC
  Filled 2022-04-26: qty 2

## 2022-04-26 NOTE — Assessment & Plan Note (Signed)
Seen with cardiology this morning.  Patient currently on amiodarone drip.  Heart rate low better controlled today than yesterday.  Heart rate with sitting up went up to 121 but at rest was in the 90s.  Cardiology plans on doing a TEE cardioversion potentially tomorrow if respiratory status is better.  Continue Xarelto for anticoagulation.

## 2022-04-26 NOTE — Progress Notes (Addendum)
Progress Note   Patient: Theresa [name] WNU:[id number] DOB: 1956-05-07 DOA: 04/23/2022     3 DOS: the patient was seen and examined on 04/26/2022  Hospital course. 66 year old female past medical history of diastolic congestive heart failure, hypertension chronic respiratory failure on 2.5 L of oxygen, COPD, GERD, hypothyroidism, wears BiPAP at night.  She presented with shortness of breath and fatigue.  She was found to be in atrial flutter with rapid ventricular response.  Initially tried on Cardizem drip but blood pressure continued to go low and switched over to amiodarone drip.  She had worsening respiratory status on 04/24/2022 requiring high flow nasal cannula and transferred to the stepdown unit.  I started Solu-Medrol on 04/24/2022 for COPD exacerbation.  Patient's respiratory status gradually improving.  Cardiology planning on a TEE cardioversion once stable.  On Xarelto for anticoagulation.  Assessment and Plan: * Atrial flutter with rapid ventricular response (HCC) Seen with cardiology this morning.  Patient currently on amiodarone drip.  Heart rate low better controlled today than yesterday.  Heart rate with sitting up went up to 121 but at rest was in the 90s.  Cardiology plans on doing a TEE cardioversion potentially tomorrow if respiratory status is better.  Continue Xarelto for anticoagulation.  Looks like a dose of digoxin was given IV early morning and digoxin level is high this morning.  Hold further digoxin at this point.  Acute on chronic respiratory failure with hypoxia and hypercapnia (HCC) The patient had worsening respiratory distress 2 days ago in the afternoon.   Had a pulse ox of 82% on her oxygen the patient normally wears 2.5 L of oxygen during the day and BiPAP at night.  Patient still on high flow nasal cannula this morning was on 6 L and I dialed her down to 4 L.  Continue to try to titrate to her baseline 2.5 L.  COPD with acute exacerbation (HCC) Continue Solu-Medrol  inhalers and nebulizer treatments.  Little less wheezing than yesterday.  Chronic diastolic CHF (congestive heart failure) (HCC) Holding parameters on torsemide.  Hold Aldactone with relative hypotension.  Pulmonary arterial hypertension (HCC) Continue Ambrisentan  Hypothyroidism, unspecified Continue levothyroxine  Depression Continue Zoloft  Obesity (BMI 30-39.9) BMI of 35.20  Hyperlipidemia, unspecified Continue Lipitor        Subjective: Patient breathing a little bit better.  Still with some shortness of breath.  Heart rate a little better controlled today.  Still feels weak.  Physical Exam: Vitals:   04/26/22 1000 04/26/22 1030 04/26/22 1100 04/26/22 1130  BP:    113/74  Pulse: (!) 141 (!) 136 (!) 109 (!) 104  Resp: (!) 31 (!) 33 (!) 24 18  Temp:      TempSrc:      SpO2: 96% 91% 99% 93%  Weight:      Height:       Physical Exam HENT:     Head: Normocephalic.     Mouth/Throat:     Pharynx: No oropharyngeal exudate.  Eyes:     General: Lids are normal.     Conjunctiva/sclera: Conjunctivae normal.  Cardiovascular:     Rate and Rhythm: Tachycardia present. Rhythm irregularly irregular.     Heart sounds: S1 normal and S2 normal.  Pulmonary:     Breath sounds: Examination of the right-lower field reveals decreased breath sounds and wheezing. Examination of the left-lower field reveals decreased breath sounds and wheezing. Decreased breath sounds and wheezing present. No rhonchi or rales.  Abdominal:  Palpations: Abdomen is soft.     Tenderness: There is no abdominal tenderness.  Musculoskeletal:     Right ankle: Swelling present.     Left ankle: Swelling present.  Skin:    General: Skin is warm.     Findings: No rash.  Neurological:     Mental Status: She is alert and oriented to person, place, and time.     Data Reviewed: BUN slightly high at 28, creatinine 0.82, digoxin level 3.4  Family Communication: Left message for  husband  Disposition: Status is: Inpatient Remains inpatient appropriate because: On amiodarone drip for rate control for atrial flutter will need TEE cardioversion.  Being treated for COPD exacerbation with Solu-Medrol.  Can transfer to the progressive care unit if bed available.  Planned Discharge Destination: Home    Time spent: 28 minutes Case discussed with cardiology at the bedside  Author: Loletha Grayer, MD 04/26/2022 12:27 PM  For on call review [email or web address].

## 2022-04-26 NOTE — Progress Notes (Signed)
NAME:  Theresa [name], MRN:  [id number], DOB:  1956-07-21, LOS: 3 ADMISSION DATE:  04/23/2022, CONSULTATION DATE: 04/24/22 REFERRING MD: Dr. Leslye Peer, CHIEF COMPLAINT: Shortness of Breath   History of Present Illness:  This is a 66 yo female who presented to [facility] ER on 07/8 with elevated heart rate.  According to the pt she checked her heart rate via a cardiac monitor at home that routes to her outpatient provider, and was instructed to proceed to urgent care or the ER for further evaluation due to heart rate reading between 150 to 160's.  She also endorsed some chest discomfort, but denied chest pain.  04/25/22- patient in [place] today.  Patient was fidgety and aggitated reporting discomfort and anxiety. Denies alcohol/drug withdrawal. She received ativan for aggitation and 1 dose of digoxin 564mg.   04/26/22- patient is clinically improved she continues to have intermittent bouts of AFL with RVR but mostly staying <100HR.   Reviewed case with [facility].   Pertinent  Medical History  Tobacco Abuse Obesity GERD DDD COPD (Bipap qhs) Chronic Diastolic CHF (Echo EF 55 to 658% grade I diastolic dysfunction) Anxiety HTN COVID-19 Current Everyday Smoker  Lumbar Radiculitis   Significant Hospital Events: Including procedures, antibiotic start and stop dates in addition to other pertinent events   07/8: Pt admitted to the progressive care unit with atrial flutter/fibrillation requiring cardizem gtt but remained in the ER pending bed availability  07/9: Due to soft bp's cardizem gtt discontinued and amiodarone gtt initiated.  Pt developed acute on chronic hypoxic respiratory failure secondary to AECOPD requiring HFNC PCCM team consulted to assist with management.  Pt changed to stepdown status    Objective   Blood pressure 98/65, pulse 81, temperature 98.1 F (36.7 C), temperature source Oral, resp. rate (!) 24, height _0  (1.575 m), weight 87.3 kg, SpO2 96 %.         Intake/Output Summary (Last 24 hours) at 04/26/2022 1015 Last data filed at 04/26/2022 0900 Gross per 24 hour  Intake 1879.57 ml  Output 3075 ml  Net -1195.43 ml    Filed Weights   04/23/22 1612 04/24/22 1528  Weight: 90.6 kg 87.3 kg    Examination: General: Acutely ill appearing female, mild respiratory distress on HFNC  HENT: Supple, no JVD  Lungs: mild wheezing throughout, mild tachypnea Cardiovascular: Irregular irregular, no r/g, 2+ radial/2+ distal pulses, no edema  Abdomen: +BS x4, obese, soft, non tender, non distended  Extremities: Normal bulk and tone, no edema  Neuro: Alert and oriented, follows commands  GU: Deferred   Resolved Hospital Problem list     Assessment & Plan:   Acute on chronic hypoxic respiratory failure secondary to AECOPD  Hx: Current everyday smoker and Bipap qhs  - Prn Bipap or supplemental O2 for dyspnea and/or hypoxia  - Bipap qhs  - Scheduled and prn bronchodilator therapy - IV and nebulized steroids  - Smoking cessation counseling provided  - Continue nicotine patch   New onset atrial fibrillation/flutter  Moderate pulmonary HTN  Chronic diastolic CHF  - Continuous telemetry monitoring  - Continue amiodarone gtt for rate control - Cardiology consulted appreciate input  - Continue outpatient torsemide and simvastatin  - Continue xarelto  Best Practice (right click and "Reselect all SmartList Selections" daily)   Diet/type: Regular consistency (see orders) DVT prophylaxis: Xarelto GI prophylaxis: N/A Lines: N/A Foley:  N/A Code Status:  full code Last date of multidisciplinary goals of care discussion [N/A]  Labs  CBC: Recent Labs  Lab 04/23/22 1619 04/24/22 0650  WBC 11.2* 6.8  HGB 14.3 13.0  HCT 45.2 40.8  MCV 99.1 99.3  PLT 200 172     Basic Metabolic Panel: Recent Labs  Lab 04/23/22 1619 04/23/22 1721 04/25/22 0505 04/26/22 0408  NA 140  --  141 138  K 3.9  --  4.0 4.6  CL 105  --  103 100  CO2 27   --  31 31  GLUCOSE 142*  --  138* 145*  BUN 18  --  23 28*  CREATININE 0.82  --  0.87 0.82  CALCIUM 8.8*  --  8.9 9.1  MG  --  2.3  --  3.1*  PHOS  --   --   --  3.0    GFR: Estimated Creatinine Clearance: 71.1 mL/min (by C-G formula based on SCr of 0.82 mg/dL). Recent Labs  Lab 04/23/22 1619 04/24/22 0650  WBC 11.2* 6.8     Liver Function Tests: Recent Labs  Lab 04/23/22 1721 04/23/22 1814  AST 21 23  ALT 23 24  ALKPHOS 62 66  BILITOT 0.6 0.5  PROT 6.7 6.8  ALBUMIN 3.7 3.8    Recent Labs  Lab 04/23/22 1721  LIPASE 48    No results for input(s): "AMMONIA" in the last 168 hours.  ABG    Component Value Date/Time   PHART 7.43 04/08/2022 0912   PCO2ART 65 (H) 04/08/2022 0912   PO2ART 66 (L) 04/08/2022 0912   HCO3 43.1 (H) 04/08/2022 0912   TCO2 36 (H) 10/11/2019 2238   O2SAT 92.5 04/08/2022 0912     Coagulation Profile: No results for input(s): "INR", "PROTIME" in the last 168 hours.  Cardiac Enzymes: No results for input(s): "CKTOTAL", "CKMB", "CKMBINDEX", "TROPONINI" in the last 168 hours.  HbA1C: Hgb A1c MFr Bld  Date/Time Value Ref Range Status  04/08/2022 09:56 AM 5.7 (H) 4.8 - 5.6 % Final    Comment:    (NOTE) Pre diabetes:          5.7%-6.4%  Diabetes:              >6.4%  Glycemic control for   <7.0% adults with diabetes   02/04/2020 04:38 AM 6.4 (H) 4.8 - 5.6 % Final    Comment:    (NOTE) Pre diabetes:          5.7%-6.4% Diabetes:              >6.4% Glycemic control for   <7.0% adults with diabetes     CBG: Recent Labs  Lab 04/24/22 1519  GLUCAP 215*     Review of Systems: Positives in BOLD   Gen: Denies fever, chills, weight change, fatigue, night sweats HEENT: Denies blurred vision, double vision, hearing loss, tinnitus, sinus congestion, rhinorrhea, sore throat, neck stiffness, dysphagia PULM: shortness of breath, cough, sputum production, hemoptysis, wheezing CV: chest discomfort, edema, orthopnea, paroxysmal  nocturnal dyspnea, palpitations GI: Denies abdominal pain, nausea, vomiting, diarrhea, hematochezia, melena, constipation, change in bowel habits GU: Denies dysuria, hematuria, polyuria, oliguria, urethral discharge Endocrine: Denies hot or cold intolerance, polyuria, polyphagia or appetite change Derm: Denies rash, dry skin, scaling or peeling skin change Heme: Denies easy bruising, bleeding, bleeding gums Neuro: Denies headache, numbness, weakness, slurred speech, loss of memory or consciousness  Past Medical History:  She,  has a past medical history of Acute postoperative pain (10/03/2017), Anxiety, BiPAP (biphasic positive airway pressure) dependence, Carpal tunnel syndrome, CHF (congestive heart failure) ([place]),  CHF (congestive heart failure) ([facility]), Chronic generalized abdominal pain, Chronic rhinitis, COPD (chronic obstructive pulmonary disease) ([place]), DDD (degenerative disc disease), cervical, DDD (degenerative disc disease), lumbosacral, Depression, Dyspnea, Edema, Flu, Gastritis, GERD (gastroesophageal reflux disease), Hematuria, Hernia of abdominal wall (07/17/2018), Hernia, abdominal, Hypertension, Kidney stones, Low back pain, Lumbar radiculitis, Malodorous urine, Muscle weakness, Obesity, Oxygen dependent, Pneumonia due to COVID-19 virus (02/03/2020), Renal cyst, Sensory urge incontinence, Thyroid activity decreased, Tobacco abuse, and Wheezing.   Surgical History:   Past Surgical History:  Procedure Laterality Date   ABDOMINAL HYSTERECTOMY     CARPAL TUNNEL RELEASE Left 2012   EPIGASTRIC HERNIA REPAIR N/A 08/13/2018   HERNIA REPAIR EPIGASTRIC ADULT; polypropylene mesh reinforcement.  Surgeon: Robert Bellow, MD;  Location: [facility] ORS;  Service: General;  Laterality: N/A;   RIGHT/LEFT HEART CATH AND CORONARY ANGIOGRAPHY N/A 07/11/2019   Procedure: RIGHT/LEFT HEART CATH AND CORONARY ANGIOGRAPHY;  Surgeon: Yolonda Kida, MD;  Location: [place] [facility];  Service:  Cardiovascular;  Laterality: N/A;   TONSILLECTOMY     TUBAL LIGATION     UMBILICAL HERNIA REPAIR N/A 08/13/2018   HERNIA REPAIR UMBILICAL ADULT; polypropylene mesh reinforcement Surgeon: Robert Bellow, MD;  Location: [facility] ORS;  Service: General;  Laterality: N/A;     Social History:   reports that she has been smoking cigarettes. She has a 22.00 pack-year smoking history. She has never used smokeless tobacco. She reports current drug use. She reports that she does not drink alcohol.   Family History:  Her family history includes Alcohol abuse in her father; Breast cancer in her sister; Cancer in her brother, brother, and sister; Coronary artery disease in her mother; Heart disease in her father and mother; Lung cancer in her sister; Pneumonia in her brother; Stroke in her mother. There is no history of Prostate cancer, Kidney cancer, or Bladder Cancer.   Allergies Allergies  Allergen Reactions   Gabapentin Other (See Comments)    Higher doses of 800 mg, 600 mg, 300 mg causes excessive sedation and contributes to numerous mechanical falls. (Unsure if 100 mg dose can be tolerated)   Codeine Nausea And Vomiting   Meloxicam Other (See Comments)    Stomach pain     Home Medications  Prior to Admission medications   Medication Sig Start Date End Date Taking? Authorizing Provider  acetaminophen (TYLENOL) 500 MG tablet Take 1,000 mg by mouth every 8 (eight) hours as needed.   Yes [provider]  albuterol (VENTOLIN HFA) 108 (90 Base) MCG/ACT inhaler Inhale 2 puffs into the lungs every 6 (six) hours as needed for wheezing or shortness of breath. 10/23/19  Yes Veronese, [place], MD  ambrisentan (LETAIRIS) 5 MG tablet Take 5 mg by mouth daily. 01/13/22  Yes [provider]  aspirin EC 81 MG tablet Take 81 mg by mouth daily.   Yes [provider]  Besifloxacin HCl (BESIVANCE) 0.6 % SUSP Place 1 drop into the right eye 3 (three) times daily. For 2 weeks starting  03/29/22   Yes [provider]  Bromfenac Sodium (PROLENSA) 0.07 % SOLN Place 1 drop into the right eye at bedtime. For 4 weeks starting 03/29/22   Yes [provider]  butalbital-acetaminophen-caffeine (FIORICET) 50-325-40 MG tablet Take 1 tablet by mouth every 6 (six) hours as needed for headache. LIMIT USE TO PREVENT MEDICATION OVERUSE HEADACHE 04/09/22  Yes Emeterio Reeve, DO  Cholecalciferol 50 MCG (2000 UT) CAPS Take 2,000 Units by mouth daily.    Yes [provider]  dextromethorphan-guaiFENesin (MUCINEX DM) 30-600 MG 12hr tablet Take 1 tablet by mouth 2 (two) times daily as needed for cough. 04/09/22  Yes Emeterio Reeve, DO  Difluprednate 0.05 % EMUL Place 1 drop into the right eye in the morning, at noon, in the evening, and at bedtime. One drop 4 times daily for one week starting 03/29/22. One drop 3 times daily for one week. One drop 2 times daily for one week. One drop once daily for one week, then stop.   Yes [provider]  Fluticasone-Umeclidin-Vilant 100-62.5-25 MCG/INH AEPB Inhale 1 puff into the lungs daily. 10/03/18  Yes [provider]  HYDROcodone-acetaminophen (NORCO/VICODIN) 5-325 MG tablet Take 1 tablet by mouth every 8 (eight) hours as needed for severe pain. Must last 30 days 05/01/22 05/31/22 Yes Milinda Pointer, MD  ipratropium-albuterol (DUONEB) 0.5-2.5 (3) MG/3ML SOLN USE 1 VIAL VIA NEBULIZER EVERY 6 HOURS AS NEEDED FOR SHORTNESS OF BREATH OR WHEEZING 11/30/20  Yes Chrismon, Vickki Muff, PA-C  levothyroxine (SYNTHROID) 25 MCG tablet TAKE 1 TABLET(25 MCG) BY MOUTH DAILY BEFORE BREAKFAST 01/03/22  Yes Birdie Sons, MD  nicotine (NICODERM CQ - DOSED IN MG/24 HOURS) 21 mg/24hr patch Place 1 patch (21 mg total) onto the skin daily. 04/10/22  Yes Emeterio Reeve, DO  OXYGEN Inhale 2.5 L/min into the lungs.   Yes [provider]  potassium chloride SA (KLOR-CON M) 20 MEQ tablet Take 20-40 mEq by mouth 2 (two) times  daily. Take 40 mEq in the morning and 20 mEq in the evening. 04/11/22  Yes [provider]  roflumilast (DALIRESP) 500 MCG TABS tablet Take 500 mcg by mouth daily.   Yes [provider]  sertraline (ZOLOFT) 100 MG tablet TAKE 2 TABLETS BY MOUTH ONCE EVERY MORNING 04/12/22  Yes Birdie Sons, MD  simvastatin (ZOCOR) 20 MG tablet Take 20 mg by mouth at bedtime. 04/11/22  Yes [provider]  spironolactone (ALDACTONE) 50 MG tablet Take 1 tablet (50 mg total) by mouth daily. 04/10/22  Yes Emeterio Reeve, DO  torsemide (DEMADEX) 20 MG tablet Take 2 tablets (40 mg total) by mouth 2 (two) times daily. Take 4 tablets QAM and 2 tablets QPM Patient taking differently: Take 40-80 mg by mouth 2 (two) times daily. Take 4 tablets QAM and 2 tablets QPM 04/09/22  Yes Emeterio Reeve, DO  HYDROcodone-acetaminophen (NORCO/VICODIN) 5-325 MG tablet Take 1 tablet by mouth every 8 (eight) hours as needed for severe pain. Must last 30 days 05/31/22 06/30/22  Milinda Pointer, MD  HYDROcodone-acetaminophen (NORCO/VICODIN) 5-325 MG tablet Take 1 tablet by mouth every 8 (eight) hours as needed for severe pain. Must last 30 days 06/30/22 07/30/22  Milinda Pointer, MD     Critical care provider statement:   Total critical care time: 33 minutes   Performed by: Lanney Gins MD   Critical care time was exclusive of separately billable procedures and treating other patients.   Critical care was necessary to treat or prevent imminent or life-threatening deterioration.   Critical care was time spent personally by me on the following activities: development of treatment plan with patient and/or surrogate as well as nursing, discussions with consultants, evaluation of patient's response to treatment, examination of patient, obtaining history from patient or surrogate, ordering and performing treatments and interventions, ordering and review of laboratory studies, ordering and review of radiographic  studies, pulse oximetry and re-evaluation of patient's condition.    Ottie Glazier, M.D.  Pulmonary & Critical Care Medicine

## 2022-04-26 NOTE — Hospital Course (Addendum)
66 year old female past medical history of diastolic congestive heart failure, hypertension chronic respiratory failure on 2.5 L of oxygen, COPD, GERD, hypothyroidism, wears BiPAP at night.  She presented with shortness of breath and fatigue.  She was found to be in atrial flutter with rapid ventricular response.  Initially tried on Cardizem drip but blood pressure continued to go low and switched over to amiodarone drip.  She had worsening respiratory status on 04/24/2022 requiring high flow nasal cannula and transferred to the stepdown unit.  I started Solu-Medrol on 04/24/2022 for COPD exacerbation.  Patient's respiratory status gradually improving.  Cardiology planning on a TEE cardioversion once stable.  On Xarelto for anticoagulation.

## 2022-04-26 NOTE — Assessment & Plan Note (Deleted)
Seen with cardiology this morning.  Patient currently on amiodarone drip.  Heart rate a little better controlled today than yesterday.  Heart rate just with sitting up went up to 121.  Cardiology plans on doing TEE cardioversion potentially tomorrow if respiratory status is better.  Continue Xarelto for anticoagulation

## 2022-04-26 NOTE — Progress Notes (Addendum)
[facility] Cardiology  CARDIOLOGY CONSULT NOTE  Patient ID: Lana Flaim MRN: [id number] DOB/AGE: November 30, 1955 66 y.o.  Admit date: 04/23/2022 Referring Physician Cordelia Poche Primary Physician Caryn Section, Kirstie Peri, MD Primary Cardiologist [facility] Reason for Consultation New onset atrial flutter/fibrillation  HPI:  Chenelle Benning is a 66 year old female with moderate pulmonary hypertension, COPD on 2.5 L, ongoing tobacco use, OSA on BiPAP for hypercapnia, chronic pain syndrome who presents with fatigue and shortness of breath.  She was discovered to be in atrial flutter with a heart rate of 150 prompting cardiology consultation.  Interval History: - remains in atrial flutter with variable conduction, rate better overnight in the 90s when sleeping, back up to the 110s-120s when awake - able to tolerate short acting cardizem so far, also s/p IV digoxin x 2 yesterday - breathing somewhat improved with less wheezing  Review of systems complete and found to be negative unless listed above     Past Medical History:  Diagnosis Date   Acute postoperative pain 10/03/2017   Anxiety    BiPAP (biphasic positive airway pressure) dependence    at hs   Carpal tunnel syndrome    CHF (congestive heart failure) ([place])    1/18   CHF (congestive heart failure) (H[facility])    Chronic generalized abdominal pain    Chronic rhinitis    COPD (chronic obstructive pulmonary disease) (H[facility])    DDD (degenerative disc disease), cervical    DDD (degenerative disc disease), lumbosacral    Depression    Dyspnea    Edema    Flu    1/18   Gastritis    GERD (gastroesophageal reflux disease)    Hematuria    Hernia of abdominal wall 07/17/2018   Hernia, abdominal    Hypertension    Kidney stones    Low back pain    Lumbar radiculitis    Malodorous urine    Muscle weakness    Obesity    Oxygen dependent    Pneumonia due to COVID-19 virus 02/03/2020   Renal cyst    Sensory urge incontinence    Thyroid  activity decreased    9/19   Tobacco abuse    Wheezing     Past Surgical History:  Procedure Laterality Date   ABDOMINAL HYSTERECTOMY     CARPAL TUNNEL RELEASE Left 2012   EPIGASTRIC HERNIA REPAIR N/A 08/13/2018   HERNIA REPAIR EPIGASTRIC ADULT; polypropylene mesh reinforcement.  Surgeon: Robert Bellow, MD;  Location: [facility] ORS;  Service: General;  Laterality: N/A;   RIGHT/LEFT HEART CATH AND CORONARY ANGIOGRAPHY N/A 07/11/2019   Procedure: RIGHT/LEFT HEART CATH AND CORONARY ANGIOGRAPHY;  Surgeon: Yolonda Kida, MD;  Location: [facility];  Service: Cardiovascular;  Laterality: N/A;   TONSILLECTOMY     TUBAL LIGATION     UMBILICAL HERNIA REPAIR N/A 08/13/2018   HERNIA REPAIR UMBILICAL ADULT; polypropylene mesh reinforcement Surgeon: Robert Bellow, MD;  Location: [facility] ORS;  Service: General;  Laterality: N/A;    Medications Prior to Admission  Medication Sig Dispense Refill Last Dose   acetaminophen (TYLENOL) 500 MG tablet Take 1,000 mg by mouth every 8 (eight) hours as needed.   prn at prn   albuterol (VENTOLIN HFA) 108 (90 Base) MCG/ACT inhaler Inhale 2 puffs into the lungs every 6 (six) hours as needed for wheezing or shortness of breath. 8 g 1 prn at prn   ambrisentan (LETAIRIS) 5 MG tablet Take 5 mg by mouth daily.   04/23/2022 at 1000  aspirin EC 81 MG tablet Take 81 mg by mouth daily.   04/23/2022 at 1000   Besifloxacin HCl (BESIVANCE) 0.6 % SUSP Place 1 drop into the right eye 3 (three) times daily. For 2 weeks starting 03/29/22   04/23/2022 at 1000   Bromfenac Sodium (PROLENSA) 0.07 % SOLN Place 1 drop into the right eye at bedtime. For 4 weeks starting 03/29/22   04/23/2022 at 1000   butalbital-acetaminophen-caffeine (FIORICET) 50-325-40 MG tablet Take 1 tablet by mouth every 6 (six) hours as needed for headache. LIMIT USE TO PREVENT MEDICATION OVERUSE HEADACHE 14 tablet 0 prn at prn   Cholecalciferol 50 MCG (2000 UT) CAPS Take 2,000 Units by mouth daily.     04/23/2022 at 1000   dextromethorphan-guaiFENesin (MUCINEX DM) 30-600 MG 12hr tablet Take 1 tablet by mouth 2 (two) times daily as needed for cough.   prn at prn   Difluprednate 0.05 % EMUL Place 1 drop into the right eye in the morning, at noon, in the evening, and at bedtime. One drop 4 times daily for one week starting 03/29/22. One drop 3 times daily for one week. One drop 2 times daily for one week. One drop once daily for one week, then stop.   04/23/2022 at 1000   Fluticasone-Umeclidin-Vilant 100-62.5-25 MCG/INH AEPB Inhale 1 puff into the lungs daily.   04/23/2022 at 1000   [START ON 05/01/2022] HYDROcodone-acetaminophen (NORCO/VICODIN) 5-325 MG tablet Take 1 tablet by mouth every 8 (eight) hours as needed for severe pain. Must last 30 days 90 tablet 0 prn at prn   ipratropium-albuterol (DUONEB) 0.5-2.5 (3) MG/3ML SOLN USE 1 VIAL VIA NEBULIZER EVERY 6 HOURS AS NEEDED FOR SHORTNESS OF BREATH OR WHEEZING 1080 mL 0 prn at prn   levothyroxine (SYNTHROID) 25 MCG tablet TAKE 1 TABLET(25 MCG) BY MOUTH DAILY BEFORE BREAKFAST 90 tablet 1 04/23/2022 at 1000   nicotine (NICODERM CQ - DOSED IN MG/24 HOURS) 21 mg/24hr patch Place 1 patch (21 mg total) onto the skin daily. 28 patch 0 Past Week at unknown   OXYGEN Inhale 2.5 L/min into the lungs.   04/23/2022 at unknown   potassium chloride SA (KLOR-CON M) 20 MEQ tablet Take 20-40 mEq by mouth 2 (two) times daily. Take 40 mEq in the morning and 20 mEq in the evening.   04/23/2022 at 1000   roflumilast (DALIRESP) 500 MCG TABS tablet Take 500 mcg by mouth daily.   04/23/2022 at 1000   sertraline (ZOLOFT) 100 MG tablet TAKE 2 TABLETS BY MOUTH ONCE EVERY MORNING 180 tablet 1 04/23/2022 at 1000   simvastatin (ZOCOR) 20 MG tablet Take 20 mg by mouth at bedtime.   04/22/2022 at 2200   spironolactone (ALDACTONE) 50 MG tablet Take 1 tablet (50 mg total) by mouth daily. 30 tablet 0 04/23/2022 at 1000   torsemide (DEMADEX) 20 MG tablet Take 2 tablets (40 mg total) by mouth 2 (two) times  daily. Take 4 tablets QAM and 2 tablets QPM (Patient taking differently: Take 40-80 mg by mouth 2 (two) times daily. Take 4 tablets QAM and 2 tablets QPM)   04/23/2022 at 1000   [START ON 05/31/2022] HYDROcodone-acetaminophen (NORCO/VICODIN) 5-325 MG tablet Take 1 tablet by mouth every 8 (eight) hours as needed for severe pain. Must last 30 days 90 tablet 0    [START ON 06/30/2022] HYDROcodone-acetaminophen (NORCO/VICODIN) 5-325 MG tablet Take 1 tablet by mouth every 8 (eight) hours as needed for severe pain. Must last 30 days 90 tablet 0  Social History   Socioeconomic History   Marital status: Married    Spouse name: Ovid Curd   Number of children: 2   Years of education: Not on file   Highest education level: 12th grade  Occupational History   Occupation: disability  Tobacco Use   Smoking status: Every Day    Packs/day: 0.50    Years: 44.00    Total pack years: 22.00    Types: Cigarettes   Smokeless tobacco: Never   Tobacco comments:    over a half pack  Vaping Use   Vaping Use: Never used  Substance and Sexual Activity   Alcohol use: No   Drug use: Yes    Comment: prescribed hydrocodone   Sexual activity: Not Currently    Birth control/protection: Surgical  Other Topics Concern   Not on file  Social History Narrative   ** Merged History Encounter **       Lives with spouse    Social Determinants of Health   Financial Resource Strain: Low Risk  (09/20/2021)   Overall Financial Resource Strain (CARDIA)    Difficulty of Paying Living Expenses: Not very hard  Food Insecurity: No Food Insecurity (12/14/2021)   Hunger Vital Sign    Worried About Running Out of Food in the Last Year: Never true    Ran Out of Food in the Last Year: Never true  Transportation Needs: No Transportation Needs (12/14/2021)   PRAPARE - Transportation    Lack of Transportation (Medical): No    Lack of Transportation (Non-Medical): No  Physical Activity: Inactive (09/20/2021)   Exercise Vital Sign     Days of Exercise per Week: 0 days    Minutes of Exercise per Session: 0 min  Stress: No Stress Concern Present (09/20/2021)   [place]    Feeling of Stress : Not at all  Social Connections: Moderately Integrated (09/20/2021)   Social Connection and Isolation Panel [NHANES]    Frequency of Communication with Friends and Family: More than three times a week    Frequency of Social Gatherings with Friends and Family: Once a week    Attends Religious Services: More than 4 times per year    Active Member of [employer] or Organizations: No    Attends Archivist Meetings: Never    Marital Status: Married  Human resources officer Violence: Not At Risk (01/22/2020)   Humiliation, Afraid, Rape, and Kick questionnaire    Fear of Current or Ex-Partner: No    Emotionally Abused: No    Physically Abused: No    Sexually Abused: No    Family History  Problem Relation Age of Onset   Heart disease Mother    Stroke Mother    Coronary artery disease Mother    Lung cancer Sister    Cancer Sister    Breast cancer Sister    Alcohol abuse Father    Heart disease Father    Cancer Brother    Cancer Brother    Pneumonia Brother    Prostate cancer Neg Hx    Kidney cancer Neg Hx    Bladder Cancer Neg Hx      PHYSICAL EXAM  General: Pleasant elderly caucasian female, well nourished, in no acute distress HEENT:  Normocephalic and atraumatic Neck:  No JVD.  Lungs: Normal respiratory effort on  oxygen. Better aeration today with less wheezing Heart: Irregularly irregular . Normal S1 and S2 without gallops or murmurs.  Abdomen: Obese appearing Msk:  Normal strength and tone for age. Extremities: No clubbing, cyanosis or edema.   Neuro: Alert and oriented X 3. Psych:  Good affect, responds appropriately  Labs:   Lab Results  Component Value Date   WBC 6.8 04/24/2022   HGB 13.0 04/24/2022   HCT 40.8 04/24/2022   MCV 99.3 04/24/2022    PLT 172 04/24/2022    Recent Labs  Lab 04/23/22 1814 04/25/22 0505 04/26/22 0408  NA  --    < > 138  K  --    < > 4.6  CL  --    < > 100  CO2  --    < > 31  BUN  --    < > 28*  CREATININE  --    < > 0.82  CALCIUM  --    < > 9.1  PROT 6.8  --   --   BILITOT 0.5  --   --   ALKPHOS 66  --   --   ALT 24  --   --   AST 23  --   --   GLUCOSE  --    < > 145*   < > = values in this interval not displayed.    Lab Results  Component Value Date   CKTOTAL 85 10/11/2019   CKMB 1.0 12/27/2012   TROPONINI <0.03 02/07/2018    Lab Results  Component Value Date   CHOL 175 04/08/2022   CHOL 241 (H) 06/10/2021   Lab Results  Component Value Date   HDL 58 04/08/2022   HDL 36 (L) 06/10/2021   Lab Results  Component Value Date   LDLCALC 86 04/08/2022   LDLCALC 171 (H) 06/10/2021   Lab Results  Component Value Date   TRIG 154 (H) 04/08/2022   TRIG 184 (H) 06/10/2021   Lab Results  Component Value Date   CHOLHDL 3.0 04/08/2022   No results found for: "LDLDIRECT"    Radiology: [facility] Chest Port 1 View  Result Date: 04/26/2022 CLINICAL DATA:  Wheezing, on CPAP, history COPD, CHF, hypertension, smoker EXAM: PORTABLE CHEST 1 VIEW COMPARISON:  Portable exam 0558 hours compared to 04/24/2022 FINDINGS: Enlargement of cardiac silhouette with pulmonary vascular congestion. Atherosclerotic calcification aorta. Linear subsegmental atelectasis at lower lungs bilaterally. No acute infiltrate, pleural effusion, or pneumothorax. IMPRESSION: Enlargement of cardiac silhouette with pulmonary vascular congestion. Bibasilar atelectasis. Electronically Signed   By: Lavonia Dana M.D.   On: 04/26/2022 08:13   DG Chest Port 1 View  Result Date: 04/24/2022 CLINICAL DATA:  Shortness of breath. EXAM: PORTABLE CHEST 1 VIEW COMPARISON:  Chest x-ray 04/23/2022. FINDINGS: Similar mild diffuse interstitial prominence. No consolidation. No visible pleural effusions or pneumothorax. Similar cardiomegaly and pulmonary  vascular congestion. IMPRESSION: 1. Similar mild diffuse interstitial prominence, which could represent mild interstitial edema versus the sequela of recurrent bouts of CHF. 2. Similar cardiomegaly and pulmonary vascular congestion. Electronically Signed   By: Margaretha Sheffield M.D.   On: 04/24/2022 14:07   DG Chest 2 View  Result Date: 04/23/2022 CLINICAL DATA:  Chest pain EXAM: CHEST - 2 VIEW COMPARISON:  04/05/2022 FINDINGS: Cardiomegaly. Both lungs are clear. Emphysema. Disc degenerative disease of the thoracic spine. IMPRESSION: 1.  Cardiomegaly without acute abnormality of the lungs. 2.  Emphysema. Electronically Signed   By: Delanna Ahmadi M.D.   On: 04/23/2022 16:43   CT Angio Chest Pulmonary Embolism (PE) W or WO Contrast  Result Date: 04/09/2022 CLINICAL DATA:  PE suspected, tachycardia EXAM: CT  ANGIOGRAPHY CHEST WITH CONTRAST TECHNIQUE: Multidetector CT imaging of the chest was performed using the standard protocol during bolus administration of intravenous contrast. Multiplanar CT image reconstructions and MIPs were obtained to evaluate the vascular anatomy. RADIATION DOSE REDUCTION: This exam was performed according to the departmental dose-optimization program which includes automated exposure control, adjustment of the mA and/or kV according to patient size and/or use of iterative reconstruction technique. CONTRAST:  26m OMNIPAQUE IOHEXOL 350 MG/ML SOLN COMPARISON:  None Available. FINDINGS: Cardiovascular: Examination for pulmonary embolism is limited by breath motion artifact within this limitation, no evidence of pulmonary embolism through the proximal segmental pulmonary arterial level no evidence of pulmonary embolism. Mild cardiomegaly three-vessel coronary artery calcifications. No pericardial effusion. Aortic atherosclerosis. Mediastinum/Nodes: No enlarged mediastinal, hilar, or axillary lymph nodes. Thyroid gland, trachea, and esophagus demonstrate no significant findings.  Lungs/Pleura: Moderate emphysema. Near-total atelectasis or consolidation of the right middle lobe (series 6, image 57). Bandlike scarring or atelectasis of the bilateral lung bases. No pleural effusion or pneumothorax. Upper Abdomen: No acute abnormality. Multiple low-attenuation lesions throughout the liver, incompletely characterized although likely benign simple cysts. Musculoskeletal: No chest wall abnormality. No acute osseous findings. Review of the MIP images confirms the above findings. IMPRESSION: 1. Examination for pulmonary embolism is limited by breath motion artifact. Within this limitation, no evidence of pulmonary embolism through the proximal segmental pulmonary arterial level. 2. Near-total atelectasis or consolidation of the right middle lobe, of uncertain chronicity. Consider bronchoscopic evaluation. 3. Additional bandlike scarring or atelectasis of the bilateral lung bases. 4. Moderate emphysema. 5. Coronary artery disease. Aortic Atherosclerosis (ICD10-I70.0) and Emphysema (ICD10-J43.9). Electronically Signed   By: ADelanna AhmadiM.D.   On: 04/09/2022 15:13   MR BRAIN WO CONTRAST  Result Date: 04/08/2022 CLINICAL DATA:  Initial evaluation for neuro deficit, stroke suspected. EXAM: MRI HEAD WITHOUT CONTRAST TECHNIQUE: Multiplanar, multiecho pulse sequences of the brain and surrounding structures were obtained without intravenous contrast. COMPARISON:  Prior study from earlier the same day. FINDINGS: Brain: Cerebral volume within normal limits. Patchy and hazy T2/FLAIR hyperintensity involving the periventricular it deep white matter both cerebral hemispheres as well as the pons, most consistent with chronic small vessel ischemic disease, moderately advanced in nature. Prominent involvement of the bilateral basal ganglia and thalami noted as well. No evidence for acute or subacute infarct. Gray-white matter differentiation maintained. No areas of chronic cortical infarction. No acute or  chronic intracranial blood products. No mass lesion, midline shift or mass effect. No hydrocephalus or extra-axial fluid collection. Pituitary gland suprasellar region within normal limits. Vascular: Major intracranial vascular flow voids are maintained. Skull and upper cervical spine: Mild cerebellar tonsillar ectopia without frank Chiari malformation. Bone marrow signal intensity within normal limits. No scalp soft tissue abnormality. Sinuses/Orbits: Prior bilateral ocular lens replacement. Paranasal sinuses are largely clear. No significant mastoid effusion. Other: None. IMPRESSION: 1. No acute intracranial abnormality. 2. Moderately advanced chronic microvascular ischemic disease for age. Electronically Signed   By: BJeannine BogaM.D.   On: 04/08/2022 23:53   CT ANGIO HEAD NECK W WO CM  Result Date: 04/08/2022 CLINICAL DATA:  Acute neuro deficit. EXAM: CT ANGIOGRAPHY HEAD AND NECK TECHNIQUE: Multidetector CT imaging of the head and neck was performed using the standard protocol during bolus administration of intravenous contrast. Multiplanar CT image reconstructions and MIPs were obtained to evaluate the vascular anatomy. Carotid stenosis measurements (when applicable) are obtained utilizing NASCET criteria, using the distal internal carotid diameter as the denominator. RADIATION DOSE REDUCTION: This exam  was performed according to the departmental dose-optimization program which includes automated exposure control, adjustment of the mA and/or kV according to patient size and/or use of iterative reconstruction technique. CONTRAST:  65m OMNIPAQUE IOHEXOL 350 MG/ML SOLN COMPARISON:  CT head 04/01/2022 FINDINGS: CT HEAD FINDINGS Brain: Ventricle size normal. Hypodensity in the frontal white matter bilaterally unchanged. Negative for acute infarct, acute hemorrhage, or mass. Vascular: Negative for hyperdense vessel Skull: Negative Sinuses: Paranasal sinuses clear. Orbits: Bilateral cataract extraction.  Review of the MIP images confirms the above findings CTA NECK FINDINGS Aortic arch: Standard branching. Imaged portion shows no evidence of aneurysm or dissection. No significant stenosis of the major arch vessel origins. Mild atherosclerotic disease aortic arch and proximal great vessels Right carotid system: Mild atherosclerotic disease at the right carotid bifurcation without stenosis. Left carotid system: Mild atherosclerotic calcification left carotid bifurcation without stenosis Vertebral arteries: Both vertebral arteries are widely patent to the skull base without stenosis Skeleton: Mild cervical spondylosis. No acute skeletal abnormality. Periapical lucency around right upper molar. Other neck: Negative for mass or adenopathy Upper chest: Mild apical emphysema.  No acute abnormality Review of the MIP images confirms the above findings CTA HEAD FINDINGS Anterior circulation: Atherosclerotic calcification and mild stenosis in the cavernous carotid bilaterally. Anterior and middle cerebral arteries widely patent without stenosis or aneurysm. Posterior circulation: Both vertebral arteries patent to the basilar. PICA patent bilaterally. Basilar widely patent. AICA, superior cerebellar, posterior cerebral arteries patent. No stenosis or aneurysm. Fetal origin posterior cerebral artery bilaterally. Venous sinuses: Normal venous enhancement Anatomic variants: None Review of the MIP images confirms the above findings IMPRESSION: 1. CT head negative for acute abnormality. Chronic white matter changes bilaterally due to chronic ischemia. 2. Mild atherosclerotic disease carotid bifurcation bilaterally. No carotid or vertebral artery stenosis in the neck. Mild atherosclerotic stenosis in the cavernous carotid bilaterally. 3. No intracranial large vessel occlusion or flow limiting stenosis Electronically Signed   By: CFranchot GalloM.D.   On: 04/08/2022 11:28   MR CERVICAL SPINE WO CONTRAST  Result Date:  04/07/2022 CLINICAL DATA:  Cervical myelopathy EXAM: MRI CERVICAL SPINE WITHOUT CONTRAST TECHNIQUE: Multiplanar, multisequence MR imaging of the cervical spine was performed. No intravenous contrast was administered. COMPARISON:  12/25/2007 MRI cervical spine 10/11/2019 CT cervical spine FINDINGS: Evaluation is somewhat limited by motion artifact. Alignment: Straightening and mild reversal of the normal cervical lordosis. No listhesis. Vertebrae: No acute fracture or suspicious osseous lesion. T1 and T2 hyperintense focus in the C5 vertebral body, most likely a benign hemangioma. Vertebral body heights are preserved. Congenitally short pedicles, which narrow the AP diameter of the spinal canal. Cord: Normal signal and morphology. Previously noted prominence of the central canal at the level of C7 is no longer seen. Posterior Fossa, vertebral arteries, paraspinal tissues: Negative. Disc levels: C2-C3: No significant disc bulge. No spinal canal stenosis or neural foraminal narrowing. C3-C4: Minimal disc bulge. Facet and uncovertebral hypertrophy, left-greater-than-right. No spinal canal stenosis. Mild-to-moderate right and severe left neural foraminal narrowing, which have progressed from the prior MRI . C4-C5: No significant disc bulge. Facet and uncovertebral hypertrophy. Moderate left and mild right neural foraminal narrowing, which have progressed from the prior exam. C5-C6: Minimal disc bulge. Ligamentum flavum hypertrophy. Facet and uncovertebral hypertrophy. Mild spinal canal stenosis, with severe right and moderate to severe left neural foraminal narrowing, which have progressed from the prior exam. C6-C7: Disc height loss and minimal disc bulge. Facet and uncovertebral hypertrophy. Ligamentum flavum hypertrophy. Mild spinal canal stenosis overall  unchanged. Mild-to-moderate right and severe left neural foraminal narrowing, which have progressed from prior exam. C7-T1: No significant disc bulge. No spinal  canal stenosis or neural foraminal narrowing. IMPRESSION: 1. Evaluation is limited by motion artifact. Within this limitation, there progressive degenerative changes compared to 2009, with mild spinal canal stenosis C4-C5 and C5-C6. 2. Multilevel facet and uncovertebral hypertrophy, which causes up to severe neural foraminal narrowing, which is seen on the left at C3-C4 and C6-C7 and on the right at C5-C6. Additional less significant neural foraminal narrowing, as described above. Electronically Signed   By: Merilyn Baba M.D.   On: 04/07/2022 00:59   DG Chest Port 1 View  Result Date: 04/05/2022 CLINICAL DATA:  Shortness of breath, hypoxia EXAM: PORTABLE CHEST 1 VIEW COMPARISON:  04/01/2022 FINDINGS: No significant change in AP portable examination. Cardiomegaly with mild, diffuse interstitial opacity and pulmonary vascular prominence. The visualized skeletal structures are unremarkable. IMPRESSION: No significant change in AP portable examination. Cardiomegaly with mild, diffuse interstitial opacity and pulmonary vascular prominence, findings most likely reflect edema. No new or focal airspace opacity. Electronically Signed   By: Delanna Ahmadi M.D.   On: 04/05/2022 09:32   MR BRAIN WO CONTRAST  Result Date: 04/04/2022 CLINICAL DATA:  Occipital region headache and scalp tenderness. EXAM: MRI HEAD WITHOUT CONTRAST TECHNIQUE: Multiplanar, multiecho pulse sequences of the brain and surrounding structures were obtained without intravenous contrast. COMPARISON:  Head CT 04/01/2022. Head MRI 10/11/2019 and 12/10/2017. FINDINGS: Brain: There is no evidence of an acute infarct, intracranial hemorrhage, mass, midline shift, or extra-axial fluid collection. Patchy T2 hyperintensities in the cerebral white matter, basal ganglia, thalami, and brainstem are stable to mildly increased compared to the prior MRI and are nonspecific but compatible with moderate chronic small vessel ischemic disease. The ventricles and sulci  are within normal limits for age. Vascular: Major intracranial vascular flow voids are preserved. Skull and upper cervical spine: Diffuse bone marrow heterogeneity including increased trace diffusion weighted signal intensity, chronic based on the 2019 MRI and nonspecific. No discrete focal lesion. Sinuses/Orbits: Bilateral cataract extraction. Paranasal sinuses and mastoid air cells are clear. Other: None. IMPRESSION: 1. No acute intracranial abnormality. 2. Moderate chronic small vessel ischemic disease. Electronically Signed   By: Logan Bores M.D.   On: 04/04/2022 14:30   ECHOCARDIOGRAM COMPLETE  Result Date: 04/02/2022    ECHOCARDIOGRAM REPORT   Patient Name:   [facility] Date of Exam: 04/02/2022 Medical Rec #:  [id number]      Height:       62.0 in Accession #:    [id number]     Weight:       202.6 lb Date of Birth:  04/28/1956       BSA:          1.922 m Patient Age:    75 years       BP:           101/58 mmHg Patient Gender: F              HR:           93 bpm. Exam Location:  [facility] Procedure: 2D Echo Indications:     CHF I50.31  History:         Patient has prior history of Echocardiogram examinations, most                  recent 08/08/2021.  Sonographer:     Kathlen Brunswick RDCS Referring Phys:  [id number] Ivor Costa  Diagnosing Phys: Yolonda Kida MD IMPRESSIONS  1. Left ventricular ejection fraction, by estimation, is 55 to 60%. The left ventricle has normal function. The left ventricle has no regional wall motion abnormalities. Left ventricular diastolic parameters are consistent with Grade I diastolic dysfunction (impaired relaxation).  2. Right ventricular systolic function is low normal. The right ventricular size is mildly enlarged.  3. Left atrial size was mildly dilated.  4. Right atrial size was mildly dilated.  5. The mitral valve is normal in structure. Trivial mitral valve regurgitation.  6. The aortic valve is grossly normal. Aortic valve regurgitation is not visualized.  Conclusion(s)/Recommendation(s): Poor windows for evaluation of left ventricular function by transthoracic echocardiography. Would recommend an alternative means of evaluation. FINDINGS  Left Ventricle: Left ventricular ejection fraction, by estimation, is 55 to 60%. The left ventricle has normal function. The left ventricle has no regional wall motion abnormalities. The left ventricular internal cavity size was normal in size. There is  no left ventricular hypertrophy. Left ventricular diastolic parameters are consistent with Grade I diastolic dysfunction (impaired relaxation). Right Ventricle: The right ventricular size is mildly enlarged. No increase in right ventricular wall thickness. Right ventricular systolic function is low normal. Left Atrium: Left atrial size was mildly dilated. Right Atrium: Right atrial size was mildly dilated. Pericardium: There is no evidence of pericardial effusion. Mitral Valve: The mitral valve is normal in structure. Trivial mitral valve regurgitation. Tricuspid Valve: The tricuspid valve is normal in structure. Tricuspid valve regurgitation is trivial. Aortic Valve: The aortic valve is grossly normal. Aortic valve regurgitation is not visualized. Aortic valve mean gradient measures 8.0 mmHg. Aortic valve peak gradient measures 15.1 mmHg. Aortic valve area, by VTI measures 2.58 cm. Pulmonic Valve: The pulmonic valve was normal in structure. Pulmonic valve regurgitation is not visualized. Aorta: The ascending aorta was not well visualized. IAS/Shunts: No atrial level shunt detected by color flow Doppler.  LEFT VENTRICLE PLAX 2D LVIDd:         4.55 cm      Diastology LVIDs:         3.12 cm      LV e' medial:    9.90 cm/s LV PW:         1.10 cm      LV E/e' medial:  10.0 LV IVS:        0.85 cm      LV e' lateral:   12.30 cm/s LVOT diam:     2.10 cm      LV E/e' lateral: 8.1 LV SV:         95 LV SV Index:   49 LVOT Area:     3.46 cm  LV Volumes (MOD) LV vol d, MOD A4C: 105.0 ml LV  vol s, MOD A4C: 39.0 ml LV SV MOD A4C:     105.0 ml RIGHT VENTRICLE RV Basal diam:  4.04 cm RV S prime:     14.40 cm/s TAPSE (M-mode): 2.8 cm LEFT ATRIUM             Index        RIGHT ATRIUM           Index LA diam:        4.30 cm 2.24 cm/m   RA Area:     29.70 cm LA Vol (A2C):   97.2 ml 50.57 ml/m  RA Volume:   110.00 ml 57.23 ml/m LA Vol (A4C):   45.2 ml 23.51 ml/m LA Biplane Vol: 66.4  ml 34.54 ml/m  AORTIC VALVE                     PULMONIC VALVE AV Area (Vmax):    2.50 cm      PV Vmax:       1.06 m/s AV Area (Vmean):   2.45 cm      PV Peak grad:  4.5 mmHg AV Area (VTI):     2.58 cm AV Vmax:           194.00 cm/s AV Vmean:          128.000 cm/s AV VTI:            0.367 m AV Peak Grad:      15.1 mmHg AV Mean Grad:      8.0 mmHg LVOT Vmax:         140.00 cm/s LVOT Vmean:        90.700 cm/s LVOT VTI:          0.273 m LVOT/AV VTI ratio: 0.74  AORTA Ao Root diam: 3.00 cm MITRAL VALVE                TRICUSPID VALVE MV Area (PHT): 6.27 cm     TV Peak grad:   43.3 mmHg MV Decel Time: 121 msec     TV Vmax:        3.29 m/s MV E velocity: 99.40 cm/s MV A velocity: 116.00 cm/s  SHUNTS MV E/A ratio:  0.86         Systemic VTI:  0.27 m                             Systemic Diam: 2.10 cm Dwayne Prince Rome MD Electronically signed by Yolonda Kida MD Signature Date/Time: 04/02/2022/3:26:17 PM    Final    CT HEAD WO CONTRAST (5MM)  Result Date: 04/01/2022 CLINICAL DATA:  66 year old female presents with new or worsening headache. EXAM: CT HEAD WITHOUT CONTRAST TECHNIQUE: Contiguous axial images were obtained from the base of the skull through the vertex without intravenous contrast. RADIATION DOSE REDUCTION: This exam was performed according to the departmental dose-optimization program which includes automated exposure control, adjustment of the mA and/or kV according to patient size and/or use of iterative reconstruction technique. COMPARISON:  Comparison is made with October 11, 2019. FINDINGS: Brain: No  evidence of acute infarction, hemorrhage, hydrocephalus, extra-axial collection or mass lesion/mass effect. Signs of mild atrophy and chronic microvascular ischemic change which is similar to previous imaging Vascular: No hyperdense vessel or unexpected calcification. Skull: Normal. Negative for fracture or focal lesion. Sinuses/Orbits: Visualized paranasal sinuses and orbits show no acute process to the extent evaluated. Other: None IMPRESSION: 1. No acute intracranial abnormality. 2. Signs of mild chronic microvascular ischemic change which is similar to previous imaging. Electronically Signed   By: Zetta Bills M.D.   On: 04/01/2022 14:07   DG Chest Port 1 View  Result Date: 04/01/2022 CLINICAL DATA:  Questionable sepsis.  Shortness of breath. EXAM: PORTABLE CHEST 1 VIEW COMPARISON:  Chest radiographs 08/06/2021 FINDINGS: The cardiac silhouette is upper limits of normal in size. There is unchanged enlargement of the pulmonary arteries. Aortic atherosclerosis is noted. The lungs are hyperinflated with chronic peribronchial thickening and underlying emphysema. The interstitial markings are slightly more prominent diffusely compared to the prior study. No focal airspace consolidation, sizeable pleural effusion, or pneumothorax is identified. No acute osseous abnormality  is seen. IMPRESSION: COPD with superimposed mild edema or infection not excluded. Electronically Signed   By: Logan Bores M.D.   On: 04/01/2022 08:31    EKG: Atrial flutter at a rate of 155 bpm.  Unclear if flutter waves are contributing to ST and T wave changes throughout.  Repeat EKG shows atrial fibrillation with a rate of 100.  Nonspecific T wave abnormalities.  Prolonged QTc.  ASSESSMENT AND PLAN:  Nikelle Malatesta is a 66 year old female with moderate pulmonary hypertension, COPD on 2.5 L, ongoing tobacco use, OSA on BiPAP for hypercapnia, chronic pain syndrome who presents with fatigue and shortness of breath.  She was discovered  to be in atrial flutter with a heart rate of 150 prompting cardiology consultation.  # New onset atrial fibrillation/flutter Presented with fatigue and shortness of breath discovered to be in atrial flutter with a rate of 150.  She converted to atrial fibrillation that has been rate controlled with diltiazem.  -CHA2DS2-VASc score = 3 (CHF, HTN, VASC) - continue Xarelto for stroke risk redcution -s/p IV diltiazem  and s/p diltiazem 120 mg daily , amiodarone gtt started 7/9, stopped at 3am due to hypotension and is not on any rate or rhythm control - went back into atrial flutter with rates 130-140 and felt anxious - restarted amiodarone drip on 7/10,  - agree with cardizem 63m PO q6h and s/p digoxin 0.5 x 2 doses with some improvement - plan for possible TEE and DCCV either tomorrow or at some point this week if her respiratory status continues to improve.   #Nonobstructive CAD Underwent heart catheterization in 2020 for evaluation of HFpEF and shortness of breath.  During this time was discovered to have a 50% mid LAD lesion which was felt to be nonsignificant. -Okay to hold aspirin in the setting of Xarelto use -Continue simvastatin 20 mg daily - LDL goal < 70  #HFpEF #Moderate pulmonary hypertension She does have some issues with volume overload chronically and takes spironolactone and torsemide for this.  This is all likely related to COPD and tobacco use as well as pulmonary hypertension. -ok to hold home spironolactone -Continue home torsemide  This patient's plan of care was discussed and created with Dr. KNehemiah Massedand he is in agreement.    Signed: LTristan Schroeder, PA-C 04/26/2022, 9:59 AM  The patient has been interviewed and examined. I agree with assessment and plan above. BSerafina RoyalsMD FLouis Stokes [facility]

## 2022-04-26 NOTE — Plan of Care (Signed)

## 2022-04-26 NOTE — Evaluation (Signed)
Physical Therapy Evaluation Patient Details Name: Jalene Demo MRN: [id number] DOB: 10/11/1956 Today's Date: 04/26/2022  History of Present Illness  66 yo female who presented to [facility] ER on 07/8 with elevated heart rate.  According to the pt she checked her heart rate via a cardiac monitor at home that routes to her outpatient provider, and was instructed to proceed to urgent care or the ER for further evaluation due to heart rate reading between 150 to 160's.  Clinical Impression  From a PT perspective pt did quite well, able to ambulate with good safety and speed with light walker use.  However, even with minimal ambulation HR rose quickly tot he 130s - did seem to stay <140 t/o the more prolonged effort (~200 ft), pt also had drop in O2 (on 4L) into the mid 80s, quickly back to the mid 90s at rest (may have been poor pleth/reading).  Pt did endorse some fatigue and is not at all near her baseline but apart from tolerance and CV issues she did do well with ambulation/safety.      Recommendations for follow up therapy are one component of a multi-disciplinary discharge planning process, led by the attending physician.  Recommendations may be updated based on patient status, additional functional criteria and insurance authorization.  Follow Up Recommendations Home health PT (per progress may not need)      Assistance Recommended at Discharge None  Patient can return home with the following  Assistance with cooking/housework;Assist for transportation;Help with stairs or ramp for entrance    Equipment Recommendations None recommended by PT  Recommendations for Other Services       Functional Status Assessment Patient has had a recent decline in their functional status and demonstrates the ability to make significant improvements in function in a reasonable and predictable amount of time.     Precautions / Restrictions Precautions Precautions: Fall Restrictions Weight Bearing  Restrictions: No      Mobility  Bed Mobility               General bed mobility comments: in recliner on arrival, apparently did not need physical assist    Transfers Overall transfer level: Modified independent Equipment used: Rolling walker (2 wheels)               General transfer comment: Pt was able to rise to standing confidently and w/o hesitation or need for assist    Ambulation/Gait Ambulation/Gait assistance: Modified independent (Device/Increase time) Gait Distance (Feet): 175 Feet Assistive device: Rolling walker (2 wheels)         General Gait Details: Pt was able to confidently assume consistent cadence at community appropriate speed.  She did endorse some increasing fatigue and HR rose to 130s relatively quickly - staying below 140s, O2 in the low 90s/high 80s during ambulation on 4L. (RN present and monitoring t/o the ambulation effort.).  Will trial ambulation w/ LRAD next session, biggest issues remains  Financial trader Rankin (Stroke Patients Only)       Balance Overall balance assessment: Independent                                           Pertinent Vitals/Pain Pain Assessment Pain Assessment: No/denies pain    Home Living Family/patient expects to be discharged to:: Private  residence Living Arrangements: Spouse/significant other;Children Available Help at Discharge: Available PRN/intermittently (husband has retuned to work (gone 6a-6p), children only intermittently able to assist) Type of Home: Mobile home Home Access: Stairs to enter Entrance Stairs-Rails: Left Entrance Stairs-Number of Steps: 3   Home Layout: One level Home Equipment: Rollator (4 wheels)      Prior Function Prior Level of Function : Independent/Modified Independent             Mobility Comments: Pt reports she only needs AD when she is out of the home a long time       Hand Dominance    Dominant Hand: Right    Extremity/Trunk Assessment   Upper Extremity Assessment Upper Extremity Assessment: Overall WFL for tasks assessed    Lower Extremity Assessment Lower Extremity Assessment: Overall WFL for tasks assessed       Communication   Communication: No difficulties  Cognition Arousal/Alertness: Awake/alert Behavior During Therapy: WFL for tasks assessed/performed Overall Cognitive Status: Within Functional Limits for tasks assessed                                          General Comments General comments (skin integrity, edema, etc.): Pt was able to move well and safely with good confidence.  Biggest issue remains cardiovascular issue/rate control    Exercises     Assessment/Plan    PT Assessment Patient needs continued PT services  PT Problem List Decreased activity tolerance;Cardiopulmonary status limiting activity;Decreased safety awareness       PT Treatment Interventions DME instruction;Gait training;Stair training;Functional mobility training;Therapeutic activities;Therapeutic exercise;Balance training;Neuromuscular re-education;Patient/family education    PT Goals (Current goals can be found in the Care Plan section)  Acute Rehab PT Goals Patient Stated Goal: go home PT Goal Formulation: With patient Time For Goal Achievement: 05/09/22 Potential to Achieve Goals: Good    Frequency Min 2X/week     Co-evaluation               AM-PAC PT "6 Clicks" Mobility  Outcome Measure Help needed turning from your back to your side while in a flat bed without using bedrails?: None Help needed moving from lying on your back to sitting on the side of a flat bed without using bedrails?: None Help needed moving to and from a bed to a chair (including a wheelchair)?: None Help needed standing up from a chair using your arms (e.g., wheelchair or bedside chair)?: None Help needed to walk in hospital room?: None Help needed climbing 3-5  steps with a railing? : A Little 6 Click Score: 23    End of Session Equipment Utilized During Treatment: Gait belt Activity Tolerance: Patient tolerated treatment well;Patient limited by fatigue Patient left: in chair;with call bell/phone within reach;with nursing/sitter in room Nurse Communication: Mobility status PT Visit Diagnosis: Muscle weakness (generalized) (M62.81);Difficulty in walking, not elsewhere classified (R26.2)    Time: 6349-4944 PT Time Calculation (min) (ACUTE ONLY): 24 min   Charges:   PT Evaluation $PT Eval Low Complexity: 1 Low PT Treatments $Therapeutic Exercise: 8-22 mins        Kreg Shropshire, DPT 04/26/2022, 12:16 PM

## 2022-04-27 DIAGNOSIS — J441 Chronic obstructive pulmonary disease with (acute) exacerbation: Secondary | ICD-10-CM | POA: Diagnosis not present

## 2022-04-27 DIAGNOSIS — J9621 Acute and chronic respiratory failure with hypoxia: Secondary | ICD-10-CM | POA: Diagnosis not present

## 2022-04-27 DIAGNOSIS — I4892 Unspecified atrial flutter: Secondary | ICD-10-CM | POA: Diagnosis not present

## 2022-04-27 DIAGNOSIS — J9622 Acute and chronic respiratory failure with hypercapnia: Secondary | ICD-10-CM | POA: Diagnosis not present

## 2022-04-27 LAB — BASIC METABOLIC PANEL
Anion gap: 8 (ref 5–15)
BUN: 30 mg/dL — ABNORMAL HIGH (ref 8–23)
CO2: 32 mmol/L (ref 22–32)
Calcium: 9.1 mg/dL (ref 8.9–10.3)
Chloride: 98 mmol/L (ref 98–111)
Creatinine, Ser: 0.9 mg/dL (ref 0.44–1.00)
GFR, Estimated: >60 mL/min (ref >60)
Glucose, Bld: 155 mg/dL — ABNORMAL HIGH (ref 70–99)
Potassium: 4.4 mmol/L (ref 3.5–5.1)
Sodium: 138 mmol/L (ref 135–145)

## 2022-04-27 LAB — CBC
HCT: 43.7 % (ref 36.0–46.0)
Hemoglobin: 13.5 g/dL (ref 12.0–15.0)
MCH: 30.5 pg (ref 26.0–34.0)
MCHC: 30.9 g/dL (ref 30.0–36.0)
MCV: 98.9 fL (ref 80.0–100.0)
Platelets: 192 10*3/uL (ref 150–400)
RBC: 4.42 MIL/uL (ref 3.87–5.11)
RDW: 15.9 % — ABNORMAL HIGH (ref 11.5–15.5)
WBC: 9.3 10*3/uL (ref 4.0–10.5)
nRBC: 0 % (ref 0.0–0.2)

## 2022-04-27 LAB — SARS CORONAVIRUS 2 BY RT PCR: SARS Coronavirus 2 by RT PCR: NEGATIVE

## 2022-04-27 LAB — TROPONIN I (HIGH SENSITIVITY): Troponin I (High Sensitivity): 11 ng/L (ref <18)

## 2022-04-27 LAB — MAGNESIUM: Magnesium: 2.9 mg/dL — ABNORMAL HIGH (ref 1.7–2.4)

## 2022-04-27 MED ORDER — METOPROLOL TARTRATE 5 MG/5ML IV SOLN
5.0000 mg | Freq: Four times a day (QID) | INTRAVENOUS | Status: DC | PRN
Start: 2022-04-27 — End: 2022-04-29
  Administered 2022-04-27 – 2022-04-28 (×2): 5 mg via INTRAVENOUS
  Filled 2022-04-27 (×4): qty 5

## 2022-04-27 MED ORDER — GUAIFENESIN ER 600 MG PO TB12
600.0000 mg | ORAL_TABLET | Freq: Two times a day (BID) | ORAL | Status: DC
  Administered 2022-04-27 – 2022-04-29 (×5): 600 mg via ORAL
  Filled 2022-04-27 (×5): qty 1

## 2022-04-27 MED ORDER — DILTIAZEM HCL 30 MG PO TABS
60.0000 mg | ORAL_TABLET | Freq: Two times a day (BID) | ORAL | Status: DC
  Administered 2022-04-28 – 2022-04-29 (×3): 60 mg via ORAL
  Filled 2022-04-27 (×3): qty 2

## 2022-04-27 MED ORDER — STERILE WATER FOR INJECTION IJ SOLN
INTRAMUSCULAR | Status: AC
  Filled 2022-04-27: qty 10

## 2022-04-27 MED ORDER — AMIODARONE HCL 200 MG PO TABS
200.0000 mg | ORAL_TABLET | Freq: Every day | ORAL | Status: DC
  Administered 2022-04-28 – 2022-04-29 (×2): 200 mg via ORAL
  Filled 2022-04-27 (×2): qty 1

## 2022-04-27 MED ORDER — AMIODARONE HCL 200 MG PO TABS
200.0000 mg | ORAL_TABLET | Freq: Two times a day (BID) | ORAL | Status: AC
Start: 2022-04-27 — End: 2022-04-27
  Administered 2022-04-27 (×2): 200 mg via ORAL
  Filled 2022-04-27 (×2): qty 1

## 2022-04-27 NOTE — Progress Notes (Addendum)
[facility] Cardiology  CARDIOLOGY CONSULT NOTE  Patient ID: Theresa [name] MRN: [id number] DOB/AGE: [birth year] 66 y.o.  Admit date: 04/23/2022 Referring Physician Cordelia Poche Primary Physician Caryn Section, Kirstie Peri, MD Primary Cardiologist [facility] Reason for Consultation New onset atrial flutter/fibrillation  HPI:  Theresa [name] is a 66 year old female with moderate pulmonary hypertension, COPD on 2.5 L, ongoing tobacco use, OSA on BiPAP for hypercapnia, chronic pain syndrome who presents with fatigue and shortness of breath.  She was discovered to be in atrial flutter with a heart rate of 150 prompting cardiology consultation.  Interval History: - remains in atrial flutter with variable conduction, rate controlled in the 90s mostly (increases with ambulation)  - remains on 4L by [place] - new dry cough today, but overall feels better with less wheezing  Review of systems complete and found to be negative unless listed above     Past Medical History:  Diagnosis Date   Acute postoperative pain 10/03/2017   Anxiety    BiPAP (biphasic positive airway pressure) dependence    at hs   Carpal tunnel syndrome    CHF (congestive heart failure) ([place])    1/18   CHF (congestive heart failure) (HCC)    Chronic generalized abdominal pain    Chronic rhinitis    COPD (chronic obstructive pulmonary disease) (HCC)    DDD (degenerative disc disease), cervical    DDD (degenerative disc disease), lumbosacral    Depression    Dyspnea    Edema    Flu    1/18   Gastritis    GERD (gastroesophageal reflux disease)    Hematuria    Hernia of abdominal wall 07/17/2018   Hernia, abdominal    Hypertension    Kidney stones    Low back pain    Lumbar radiculitis    Malodorous urine    Muscle weakness    Obesity    Oxygen dependent    Pneumonia due to COVID-19 virus 02/03/2020   Renal cyst    Sensory urge incontinence    Thyroid activity decreased    9/19   Tobacco abuse    Wheezing     Past  Surgical History:  Procedure Laterality Date   ABDOMINAL HYSTERECTOMY     CARPAL TUNNEL RELEASE Left 2012   EPIGASTRIC HERNIA REPAIR N/A 08/13/2018   HERNIA REPAIR EPIGASTRIC ADULT; polypropylene mesh reinforcement.  Surgeon: Robert Bellow, MD;  Location: [facility] ORS;  Service: General;  Laterality: N/A;   RIGHT/LEFT HEART CATH AND CORONARY ANGIOGRAPHY N/A 07/11/2019   Procedure: RIGHT/LEFT HEART CATH AND CORONARY ANGIOGRAPHY;  Surgeon: Yolonda Kida, MD;  Location: [place] [facility];  Service: Cardiovascular;  Laterality: N/A;   TONSILLECTOMY     TUBAL LIGATION     UMBILICAL HERNIA REPAIR N/A 08/13/2018   HERNIA REPAIR UMBILICAL ADULT; polypropylene mesh reinforcement Surgeon: Robert Bellow, MD;  Location: [facility] ORS;  Service: General;  Laterality: N/A;    Medications Prior to Admission  Medication Sig Dispense Refill Last Dose   acetaminophen (TYLENOL) 500 MG tablet Take 1,000 mg by mouth every 8 (eight) hours as needed.   prn at prn   albuterol (VENTOLIN HFA) 108 (90 Base) MCG/ACT inhaler Inhale 2 puffs into the lungs every 6 (six) hours as needed for wheezing or shortness of breath. 8 g 1 prn at prn   ambrisentan (LETAIRIS) 5 MG tablet Take 5 mg by mouth daily.   04/23/2022 at 1000   aspirin EC 81 MG tablet Take 81 mg by  mouth daily.   04/23/2022 at 1000   Besifloxacin HCl (BESIVANCE) 0.6 % SUSP Place 1 drop into the right eye 3 (three) times daily. For 2 weeks starting 03/29/22   04/23/2022 at 1000   Bromfenac Sodium (PROLENSA) 0.07 % SOLN Place 1 drop into the right eye at bedtime. For 4 weeks starting 03/29/22   04/23/2022 at 1000   butalbital-acetaminophen-caffeine (FIORICET) 50-325-40 MG tablet Take 1 tablet by mouth every 6 (six) hours as needed for headache. LIMIT USE TO PREVENT MEDICATION OVERUSE HEADACHE 14 tablet 0 prn at prn   Cholecalciferol 50 MCG (2000 UT) CAPS Take 2,000 Units by mouth daily.    04/23/2022 at 1000   dextromethorphan-guaiFENesin (MUCINEX DM) 30-600 MG 12hr  tablet Take 1 tablet by mouth 2 (two) times daily as needed for cough.   prn at prn   Difluprednate 0.05 % EMUL Place 1 drop into the right eye in the morning, at noon, in the evening, and at bedtime. One drop 4 times daily for one week starting 03/29/22. One drop 3 times daily for one week. One drop 2 times daily for one week. One drop once daily for one week, then stop.   04/23/2022 at 1000   Fluticasone-Umeclidin-Vilant 100-62.5-25 MCG/INH AEPB Inhale 1 puff into the lungs daily.   04/23/2022 at 1000   [START ON 05/01/2022] HYDROcodone-acetaminophen (NORCO/VICODIN) 5-325 MG tablet Take 1 tablet by mouth every 8 (eight) hours as needed for severe pain. Must last 30 days 90 tablet 0 prn at prn   ipratropium-albuterol (DUONEB) 0.5-2.5 (3) MG/3ML SOLN USE 1 VIAL VIA NEBULIZER EVERY 6 HOURS AS NEEDED FOR SHORTNESS OF BREATH OR WHEEZING 1080 mL 0 prn at prn   levothyroxine (SYNTHROID) 25 MCG tablet TAKE 1 TABLET(25 MCG) BY MOUTH DAILY BEFORE BREAKFAST 90 tablet 1 04/23/2022 at 1000   nicotine (NICODERM CQ - DOSED IN MG/24 HOURS) 21 mg/24hr patch Place 1 patch (21 mg total) onto the skin daily. 28 patch 0 Past Week at unknown   OXYGEN Inhale 2.5 L/min into the lungs.   04/23/2022 at unknown   potassium chloride SA (KLOR-CON M) 20 MEQ tablet Take 20-40 mEq by mouth 2 (two) times daily. Take 40 mEq in the morning and 20 mEq in the evening.   04/23/2022 at 1000   roflumilast (DALIRESP) 500 MCG TABS tablet Take 500 mcg by mouth daily.   04/23/2022 at 1000   sertraline (ZOLOFT) 100 MG tablet TAKE 2 TABLETS BY MOUTH ONCE EVERY MORNING 180 tablet 1 04/23/2022 at 1000   simvastatin (ZOCOR) 20 MG tablet Take 20 mg by mouth at bedtime.   04/22/2022 at 2200   spironolactone (ALDACTONE) 50 MG tablet Take 1 tablet (50 mg total) by mouth daily. 30 tablet 0 04/23/2022 at 1000   torsemide (DEMADEX) 20 MG tablet Take 2 tablets (40 mg total) by mouth 2 (two) times daily. Take 4 tablets QAM and 2 tablets QPM (Patient taking differently: Take  40-80 mg by mouth 2 (two) times daily. Take 4 tablets QAM and 2 tablets QPM)   04/23/2022 at 1000   [START ON 06/30/2022] HYDROcodone-acetaminophen (NORCO/VICODIN) 5-325 MG tablet Take 1 tablet by mouth every 8 (eight) hours as needed for severe pain. Must last 30 days 90 tablet 0     Social History   Socioeconomic History   Marital status: Married    Spouse name: Ovid Curd   Number of children: 2   Years of education: Not on file   Highest education level: 12th grade  Occupational History   Occupation: disability  Tobacco Use   Smoking status: Every Day    Packs/day: 0.50    Years: 44.00    Total pack years: 22.00    Types: Cigarettes   Smokeless tobacco: Never   Tobacco comments:    over a half pack  Vaping Use   Vaping Use: Never used  Substance and Sexual Activity   Alcohol use: No   Drug use: Yes    Comment: prescribed hydrocodone   Sexual activity: Not Currently    Birth control/protection: Surgical  Other Topics Concern   Not on file  Social History Narrative   ** Merged History Encounter **       Lives with spouse    Social Determinants of Health   Financial Resource Strain: Low Risk  (09/20/2021)   Overall Financial Resource Strain (CARDIA)    Difficulty of Paying Living Expenses: Not very hard  Food Insecurity: No Food Insecurity (12/14/2021)   Hunger Vital Sign    Worried About Running Out of Food in the Last Year: Never true    Ran Out of Food in the Last Year: Never true  Transportation Needs: No Transportation Needs (12/14/2021)   PRAPARE - Transportation    Lack of Transportation (Medical): No    Lack of Transportation (Non-Medical): No  Physical Activity: Inactive (09/20/2021)   Exercise Vital Sign    Days of Exercise per Week: 0 days    Minutes of Exercise per Session: 0 min  Stress: No Stress Concern Present (09/20/2021)   [place]    Feeling of Stress : Not at all  Social Connections:  Moderately Integrated (09/20/2021)   Social Connection and Isolation Panel [NHANES]    Frequency of Communication with Friends and Family: More than three times a week    Frequency of Social Gatherings with Friends and Family: Once a week    Attends Religious Services: More than 4 times per year    Active Member of [employer] or Organizations: No    Attends Archivist Meetings: Never    Marital Status: Married  Human resources officer Violence: Not At Risk (01/22/2020)   Humiliation, Afraid, Rape, and Kick questionnaire    Fear of Current or Ex-Partner: No    Emotionally Abused: No    Physically Abused: No    Sexually Abused: No    Family History  Problem Relation Age of Onset   Heart disease Mother    Stroke Mother    Coronary artery disease Mother    Lung cancer Sister    Cancer Sister    Breast cancer Sister    Alcohol abuse Father    Heart disease Father    Cancer Brother    Cancer Brother    Pneumonia Brother    Prostate cancer Neg Hx    Kidney cancer Neg Hx    Bladder Cancer Neg Hx      PHYSICAL EXAM  General: Pleasant elderly caucasian female, well nourished, in no acute distress HEENT:  Normocephalic and atraumatic Neck:  No JVD.  Lungs: Normal respiratory effort on  oxygen. Better aeration today with trace expiratory wheeze Heart: Irregularly irregular . Normal S1 and S2 without gallops or murmurs.  Abdomen: Obese appearing Msk:  Normal strength and tone for age. Extremities: No clubbing, cyanosis or edema.   Neuro: Alert and oriented X 3. Psych:  Good affect, responds appropriately  Labs:   Lab Results  Component Value Date  WBC 9.3 04/27/2022   HGB 13.5 04/27/2022   HCT 43.7 04/27/2022   MCV 98.9 04/27/2022   PLT 192 04/27/2022    Recent Labs  Lab 04/23/22 1814 04/25/22 0505 04/27/22 0320  NA  --    < > 138  K  --    < > 4.4  CL  --    < > 98  CO2  --    < > 32  BUN  --    < > 30*  CREATININE  --    < > 0.90  CALCIUM  --    < > 9.1  PROT  6.8  --   --   BILITOT 0.5  --   --   ALKPHOS 66  --   --   ALT 24  --   --   AST 23  --   --   GLUCOSE  --    < > 155*   < > = values in this interval not displayed.    Lab Results  Component Value Date   CKTOTAL 85 10/11/2019   CKMB 1.0 12/27/2012   TROPONINI <0.03 02/07/2018    Lab Results  Component Value Date   CHOL 175 04/08/2022   CHOL 241 (H) 06/10/2021   Lab Results  Component Value Date   HDL 58 04/08/2022   HDL 36 (L) 06/10/2021   Lab Results  Component Value Date   LDLCALC 86 04/08/2022   LDLCALC 171 (H) 06/10/2021   Lab Results  Component Value Date   TRIG 154 (H) 04/08/2022   TRIG 184 (H) 06/10/2021   Lab Results  Component Value Date   CHOLHDL 3.0 04/08/2022   No results found for: "LDLDIRECT"    Radiology: [facility] Chest Port 1 View  Result Date: 04/26/2022 CLINICAL DATA:  Wheezing, on CPAP, history COPD, CHF, hypertension, smoker EXAM: PORTABLE CHEST 1 VIEW COMPARISON:  Portable exam 0558 hours compared to 04/24/2022 FINDINGS: Enlargement of cardiac silhouette with pulmonary vascular congestion. Atherosclerotic calcification aorta. Linear subsegmental atelectasis at lower lungs bilaterally. No acute infiltrate, pleural effusion, or pneumothorax. IMPRESSION: Enlargement of cardiac silhouette with pulmonary vascular congestion. Bibasilar atelectasis. Electronically Signed   By: Lavonia Dana M.D.   On: 04/26/2022 08:13   DG Chest Port 1 View  Result Date: 04/24/2022 CLINICAL DATA:  Shortness of breath. EXAM: PORTABLE CHEST 1 VIEW COMPARISON:  Chest x-ray 04/23/2022. FINDINGS: Similar mild diffuse interstitial prominence. No consolidation. No visible pleural effusions or pneumothorax. Similar cardiomegaly and pulmonary vascular congestion. IMPRESSION: 1. Similar mild diffuse interstitial prominence, which could represent mild interstitial edema versus the sequela of recurrent bouts of CHF. 2. Similar cardiomegaly and pulmonary vascular congestion. Electronically  Signed   By: Margaretha Sheffield M.D.   On: 04/24/2022 14:07   DG Chest 2 View  Result Date: 04/23/2022 CLINICAL DATA:  Chest pain EXAM: CHEST - 2 VIEW COMPARISON:  04/05/2022 FINDINGS: Cardiomegaly. Both lungs are clear. Emphysema. Disc degenerative disease of the thoracic spine. IMPRESSION: 1.  Cardiomegaly without acute abnormality of the lungs. 2.  Emphysema. Electronically Signed   By: Delanna Ahmadi M.D.   On: 04/23/2022 16:43   CT Angio Chest Pulmonary Embolism (PE) W or WO Contrast  Result Date: 04/09/2022 CLINICAL DATA:  PE suspected, tachycardia EXAM: CT ANGIOGRAPHY CHEST WITH CONTRAST TECHNIQUE: Multidetector CT imaging of the chest was performed using the standard protocol during bolus administration of intravenous contrast. Multiplanar CT image reconstructions and MIPs were obtained to evaluate the vascular anatomy. RADIATION DOSE  REDUCTION: This exam was performed according to the departmental dose-optimization program which includes automated exposure control, adjustment of the mA and/or kV according to patient size and/or use of iterative reconstruction technique. CONTRAST:  59m OMNIPAQUE IOHEXOL 350 MG/ML SOLN COMPARISON:  None Available. FINDINGS: Cardiovascular: Examination for pulmonary embolism is limited by breath motion artifact within this limitation, no evidence of pulmonary embolism through the proximal segmental pulmonary arterial level no evidence of pulmonary embolism. Mild cardiomegaly three-vessel coronary artery calcifications. No pericardial effusion. Aortic atherosclerosis. Mediastinum/Nodes: No enlarged mediastinal, hilar, or axillary lymph nodes. Thyroid gland, trachea, and esophagus demonstrate no significant findings. Lungs/Pleura: Moderate emphysema. Near-total atelectasis or consolidation of the right middle lobe (series 6, image 57). Bandlike scarring or atelectasis of the bilateral lung bases. No pleural effusion or pneumothorax. Upper Abdomen: No acute abnormality.  Multiple low-attenuation lesions throughout the liver, incompletely characterized although likely benign simple cysts. Musculoskeletal: No chest wall abnormality. No acute osseous findings. Review of the MIP images confirms the above findings. IMPRESSION: 1. Examination for pulmonary embolism is limited by breath motion artifact. Within this limitation, no evidence of pulmonary embolism through the proximal segmental pulmonary arterial level. 2. Near-total atelectasis or consolidation of the right middle lobe, of uncertain chronicity. Consider bronchoscopic evaluation. 3. Additional bandlike scarring or atelectasis of the bilateral lung bases. 4. Moderate emphysema. 5. Coronary artery disease. Aortic Atherosclerosis (ICD10-I70.0) and Emphysema (ICD10-J43.9). Electronically Signed   By: ADelanna AhmadiM.D.   On: 04/09/2022 15:13   MR BRAIN WO CONTRAST  Result Date: 04/08/2022 CLINICAL DATA:  Initial evaluation for neuro deficit, stroke suspected. EXAM: MRI HEAD WITHOUT CONTRAST TECHNIQUE: Multiplanar, multiecho pulse sequences of the brain and surrounding structures were obtained without intravenous contrast. COMPARISON:  Prior study from earlier the same day. FINDINGS: Brain: Cerebral volume within normal limits. Patchy and hazy T2/FLAIR hyperintensity involving the periventricular it deep white matter both cerebral hemispheres as well as the pons, most consistent with chronic small vessel ischemic disease, moderately advanced in nature. Prominent involvement of the bilateral basal ganglia and thalami noted as well. No evidence for acute or subacute infarct. Gray-white matter differentiation maintained. No areas of chronic cortical infarction. No acute or chronic intracranial blood products. No mass lesion, midline shift or mass effect. No hydrocephalus or extra-axial fluid collection. Pituitary gland suprasellar region within normal limits. Vascular: Major intracranial vascular flow voids are maintained. Skull  and upper cervical spine: Mild cerebellar tonsillar ectopia without frank Chiari malformation. Bone marrow signal intensity within normal limits. No scalp soft tissue abnormality. Sinuses/Orbits: Prior bilateral ocular lens replacement. Paranasal sinuses are largely clear. No significant mastoid effusion. Other: None. IMPRESSION: 1. No acute intracranial abnormality. 2. Moderately advanced chronic microvascular ischemic disease for age. Electronically Signed   By: BJeannine BogaM.D.   On: 04/08/2022 23:53   CT ANGIO HEAD NECK W WO CM  Result Date: 04/08/2022 CLINICAL DATA:  Acute neuro deficit. EXAM: CT ANGIOGRAPHY HEAD AND NECK TECHNIQUE: Multidetector CT imaging of the head and neck was performed using the standard protocol during bolus administration of intravenous contrast. Multiplanar CT image reconstructions and MIPs were obtained to evaluate the vascular anatomy. Carotid stenosis measurements (when applicable) are obtained utilizing NASCET criteria, using the distal internal carotid diameter as the denominator. RADIATION DOSE REDUCTION: This exam was performed according to the departmental dose-optimization program which includes automated exposure control, adjustment of the mA and/or kV according to patient size and/or use of iterative reconstruction technique. CONTRAST:  714mOMNIPAQUE IOHEXOL 350 MG/ML SOLN COMPARISON:  CT head 04/01/2022 FINDINGS: CT HEAD FINDINGS Brain: Ventricle size normal. Hypodensity in the frontal white matter bilaterally unchanged. Negative for acute infarct, acute hemorrhage, or mass. Vascular: Negative for hyperdense vessel Skull: Negative Sinuses: Paranasal sinuses clear. Orbits: Bilateral cataract extraction. Review of the MIP images confirms the above findings CTA NECK FINDINGS Aortic arch: Standard branching. Imaged portion shows no evidence of aneurysm or dissection. No significant stenosis of the major arch vessel origins. Mild atherosclerotic disease aortic arch  and proximal great vessels Right carotid system: Mild atherosclerotic disease at the right carotid bifurcation without stenosis. Left carotid system: Mild atherosclerotic calcification left carotid bifurcation without stenosis Vertebral arteries: Both vertebral arteries are widely patent to the skull base without stenosis Skeleton: Mild cervical spondylosis. No acute skeletal abnormality. Periapical lucency around right upper molar. Other neck: Negative for mass or adenopathy Upper chest: Mild apical emphysema.  No acute abnormality Review of the MIP images confirms the above findings CTA HEAD FINDINGS Anterior circulation: Atherosclerotic calcification and mild stenosis in the cavernous carotid bilaterally. Anterior and middle cerebral arteries widely patent without stenosis or aneurysm. Posterior circulation: Both vertebral arteries patent to the basilar. PICA patent bilaterally. Basilar widely patent. AICA, superior cerebellar, posterior cerebral arteries patent. No stenosis or aneurysm. Fetal origin posterior cerebral artery bilaterally. Venous sinuses: Normal venous enhancement Anatomic variants: None Review of the MIP images confirms the above findings IMPRESSION: 1. CT head negative for acute abnormality. Chronic white matter changes bilaterally due to chronic ischemia. 2. Mild atherosclerotic disease carotid bifurcation bilaterally. No carotid or vertebral artery stenosis in the neck. Mild atherosclerotic stenosis in the cavernous carotid bilaterally. 3. No intracranial large vessel occlusion or flow limiting stenosis Electronically Signed   By: Franchot Gallo M.D.   On: 04/08/2022 11:28   MR CERVICAL SPINE WO CONTRAST  Result Date: 04/07/2022 CLINICAL DATA:  Cervical myelopathy EXAM: MRI CERVICAL SPINE WITHOUT CONTRAST TECHNIQUE: Multiplanar, multisequence MR imaging of the cervical spine was performed. No intravenous contrast was administered. COMPARISON:  12/25/2007 MRI cervical spine 10/11/2019 CT  cervical spine FINDINGS: Evaluation is somewhat limited by motion artifact. Alignment: Straightening and mild reversal of the normal cervical lordosis. No listhesis. Vertebrae: No acute fracture or suspicious osseous lesion. T1 and T2 hyperintense focus in the C5 vertebral body, most likely a benign hemangioma. Vertebral body heights are preserved. Congenitally short pedicles, which narrow the AP diameter of the spinal canal. Cord: Normal signal and morphology. Previously noted prominence of the central canal at the level of C7 is no longer seen. Posterior Fossa, vertebral arteries, paraspinal tissues: Negative. Disc levels: C2-C3: No significant disc bulge. No spinal canal stenosis or neural foraminal narrowing. C3-C4: Minimal disc bulge. Facet and uncovertebral hypertrophy, left-greater-than-right. No spinal canal stenosis. Mild-to-moderate right and severe left neural foraminal narrowing, which have progressed from the prior MRI . C4-C5: No significant disc bulge. Facet and uncovertebral hypertrophy. Moderate left and mild right neural foraminal narrowing, which have progressed from the prior exam. C5-C6: Minimal disc bulge. Ligamentum flavum hypertrophy. Facet and uncovertebral hypertrophy. Mild spinal canal stenosis, with severe right and moderate to severe left neural foraminal narrowing, which have progressed from the prior exam. C6-C7: Disc height loss and minimal disc bulge. Facet and uncovertebral hypertrophy. Ligamentum flavum hypertrophy. Mild spinal canal stenosis overall unchanged. Mild-to-moderate right and severe left neural foraminal narrowing, which have progressed from prior exam. C7-T1: No significant disc bulge. No spinal canal stenosis or neural foraminal narrowing. IMPRESSION: 1. Evaluation is limited by motion artifact. Within this limitation,  there progressive degenerative changes compared to 2009, with mild spinal canal stenosis C4-C5 and C5-C6. 2. Multilevel facet and uncovertebral  hypertrophy, which causes up to severe neural foraminal narrowing, which is seen on the left at C3-C4 and C6-C7 and on the right at C5-C6. Additional less significant neural foraminal narrowing, as described above. Electronically Signed   By: Merilyn Baba M.D.   On: 04/07/2022 00:59   DG Chest Port 1 View  Result Date: 04/05/2022 CLINICAL DATA:  Shortness of breath, hypoxia EXAM: PORTABLE CHEST 1 VIEW COMPARISON:  04/01/2022 FINDINGS: No significant change in AP portable examination. Cardiomegaly with mild, diffuse interstitial opacity and pulmonary vascular prominence. The visualized skeletal structures are unremarkable. IMPRESSION: No significant change in AP portable examination. Cardiomegaly with mild, diffuse interstitial opacity and pulmonary vascular prominence, findings most likely reflect edema. No new or focal airspace opacity. Electronically Signed   By: Delanna Ahmadi M.D.   On: 04/05/2022 09:32   MR BRAIN WO CONTRAST  Result Date: 04/04/2022 CLINICAL DATA:  Occipital region headache and scalp tenderness. EXAM: MRI HEAD WITHOUT CONTRAST TECHNIQUE: Multiplanar, multiecho pulse sequences of the brain and surrounding structures were obtained without intravenous contrast. COMPARISON:  Head CT 04/01/2022. Head MRI 10/11/2019 and 12/10/2017. FINDINGS: Brain: There is no evidence of an acute infarct, intracranial hemorrhage, mass, midline shift, or extra-axial fluid collection. Patchy T2 hyperintensities in the cerebral white matter, basal ganglia, thalami, and brainstem are stable to mildly increased compared to the prior MRI and are nonspecific but compatible with moderate chronic small vessel ischemic disease. The ventricles and sulci are within normal limits for age. Vascular: Major intracranial vascular flow voids are preserved. Skull and upper cervical spine: Diffuse bone marrow heterogeneity including increased trace diffusion weighted signal intensity, chronic based on the 2019 MRI and  nonspecific. No discrete focal lesion. Sinuses/Orbits: Bilateral cataract extraction. Paranasal sinuses and mastoid air cells are clear. Other: None. IMPRESSION: 1. No acute intracranial abnormality. 2. Moderate chronic small vessel ischemic disease. Electronically Signed   By: Logan Bores M.D.   On: 04/04/2022 14:30   ECHOCARDIOGRAM COMPLETE  Result Date: 04/02/2022    ECHOCARDIOGRAM REPORT   Patient Name:   [facility] Date of Exam: 04/02/2022 Medical Rec #:  [id number]      Height:       62.0 in Accession #:    [id number]     Weight:       202.6 lb Date of Birth:  10-15-1956       BSA:          1.922 m Patient Age:    64 years       BP:           101/58 mmHg Patient Gender: F              HR:           93 bpm. Exam Location:  [facility] Procedure: 2D Echo Indications:     CHF I50.31  History:         Patient has prior history of Echocardiogram examinations, most                  recent 08/08/2021.  Sonographer:     Kathlen Brunswick RDCS Referring Phys:  [id number] Soledad Gerlach NIU Diagnosing Phys: Yolonda Kida MD IMPRESSIONS  1. Left ventricular ejection fraction, by estimation, is 55 to 60%. The left ventricle has normal function. The left ventricle has no regional wall motion abnormalities. Left ventricular diastolic parameters are  consistent with Grade I diastolic dysfunction (impaired relaxation).  2. Right ventricular systolic function is low normal. The right ventricular size is mildly enlarged.  3. Left atrial size was mildly dilated.  4. Right atrial size was mildly dilated.  5. The mitral valve is normal in structure. Trivial mitral valve regurgitation.  6. The aortic valve is grossly normal. Aortic valve regurgitation is not visualized. Conclusion(s)/Recommendation(s): Poor windows for evaluation of left ventricular function by transthoracic echocardiography. Would recommend an alternative means of evaluation. FINDINGS  Left Ventricle: Left ventricular ejection fraction, by estimation, is 55 to 60%. The left  ventricle has normal function. The left ventricle has no regional wall motion abnormalities. The left ventricular internal cavity size was normal in size. There is  no left ventricular hypertrophy. Left ventricular diastolic parameters are consistent with Grade I diastolic dysfunction (impaired relaxation). Right Ventricle: The right ventricular size is mildly enlarged. No increase in right ventricular wall thickness. Right ventricular systolic function is low normal. Left Atrium: Left atrial size was mildly dilated. Right Atrium: Right atrial size was mildly dilated. Pericardium: There is no evidence of pericardial effusion. Mitral Valve: The mitral valve is normal in structure. Trivial mitral valve regurgitation. Tricuspid Valve: The tricuspid valve is normal in structure. Tricuspid valve regurgitation is trivial. Aortic Valve: The aortic valve is grossly normal. Aortic valve regurgitation is not visualized. Aortic valve mean gradient measures 8.0 mmHg. Aortic valve peak gradient measures 15.1 mmHg. Aortic valve area, by VTI measures 2.58 cm. Pulmonic Valve: The pulmonic valve was normal in structure. Pulmonic valve regurgitation is not visualized. Aorta: The ascending aorta was not well visualized. IAS/Shunts: No atrial level shunt detected by color flow Doppler.  LEFT VENTRICLE PLAX 2D LVIDd:         4.55 cm      Diastology LVIDs:         3.12 cm      LV e' medial:    9.90 cm/s LV PW:         1.10 cm      LV E/e' medial:  10.0 LV IVS:        0.85 cm      LV e' lateral:   12.30 cm/s LVOT diam:     2.10 cm      LV E/e' lateral: 8.1 LV SV:         95 LV SV Index:   49 LVOT Area:     3.46 cm  LV Volumes (MOD) LV vol d, MOD A4C: 105.0 ml LV vol s, MOD A4C: 39.0 ml LV SV MOD A4C:     105.0 ml RIGHT VENTRICLE RV Basal diam:  4.04 cm RV S prime:     14.40 cm/s TAPSE (M-mode): 2.8 cm LEFT ATRIUM             Index        RIGHT ATRIUM           Index LA diam:        4.30 cm 2.24 cm/m   RA Area:     29.70 cm LA Vol  (A2C):   97.2 ml 50.57 ml/m  RA Volume:   110.00 ml 57.23 ml/m LA Vol (A4C):   45.2 ml 23.51 ml/m LA Biplane Vol: 66.4 ml 34.54 ml/m  AORTIC VALVE                     PULMONIC VALVE AV Area (Vmax):    2.50 cm  PV Vmax:       1.06 m/s AV Area (Vmean):   2.45 cm      PV Peak grad:  4.5 mmHg AV Area (VTI):     2.58 cm AV Vmax:           194.00 cm/s AV Vmean:          128.000 cm/s AV VTI:            0.367 m AV Peak Grad:      15.1 mmHg AV Mean Grad:      8.0 mmHg LVOT Vmax:         140.00 cm/s LVOT Vmean:        90.700 cm/s LVOT VTI:          0.273 m LVOT/AV VTI ratio: 0.74  AORTA Ao Root diam: 3.00 cm MITRAL VALVE                TRICUSPID VALVE MV Area (PHT): 6.27 cm     TV Peak grad:   43.3 mmHg MV Decel Time: 121 msec     TV Vmax:        3.29 m/s MV E velocity: 99.40 cm/s MV A velocity: 116.00 cm/s  SHUNTS MV E/A ratio:  0.86         Systemic VTI:  0.27 m                             Systemic Diam: 2.10 cm Dwayne Prince Rome MD Electronically signed by Yolonda Kida MD Signature Date/Time: 04/02/2022/3:26:17 PM    Final    CT HEAD WO CONTRAST (5MM)  Result Date: 04/01/2022 CLINICAL DATA:  66 year old female presents with new or worsening headache. EXAM: CT HEAD WITHOUT CONTRAST TECHNIQUE: Contiguous axial images were obtained from the base of the skull through the vertex without intravenous contrast. RADIATION DOSE REDUCTION: This exam was performed according to the departmental dose-optimization program which includes automated exposure control, adjustment of the mA and/or kV according to patient size and/or use of iterative reconstruction technique. COMPARISON:  Comparison is made with October 11, 2019. FINDINGS: Brain: No evidence of acute infarction, hemorrhage, hydrocephalus, extra-axial collection or mass lesion/mass effect. Signs of mild atrophy and chronic microvascular ischemic change which is similar to previous imaging Vascular: No hyperdense vessel or unexpected calcification. Skull:  Normal. Negative for fracture or focal lesion. Sinuses/Orbits: Visualized paranasal sinuses and orbits show no acute process to the extent evaluated. Other: None IMPRESSION: 1. No acute intracranial abnormality. 2. Signs of mild chronic microvascular ischemic change which is similar to previous imaging. Electronically Signed   By: Zetta Bills M.D.   On: 04/01/2022 14:07   DG Chest Port 1 View  Result Date: 04/01/2022 CLINICAL DATA:  Questionable sepsis.  Shortness of breath. EXAM: PORTABLE CHEST 1 VIEW COMPARISON:  Chest radiographs 08/06/2021 FINDINGS: The cardiac silhouette is upper limits of normal in size. There is unchanged enlargement of the pulmonary arteries. Aortic atherosclerosis is noted. The lungs are hyperinflated with chronic peribronchial thickening and underlying emphysema. The interstitial markings are slightly more prominent diffusely compared to the prior study. No focal airspace consolidation, sizeable pleural effusion, or pneumothorax is identified. No acute osseous abnormality is seen. IMPRESSION: COPD with superimposed mild edema or infection not excluded. Electronically Signed   By: Logan Bores M.D.   On: 04/01/2022 08:31    EKG: Atrial flutter at a rate of 155 bpm.  Unclear if  flutter waves are contributing to ST and T wave changes throughout.  Repeat EKG shows atrial fibrillation with a rate of 100.  Nonspecific T wave abnormalities.  Prolonged QTc.  ASSESSMENT AND PLAN:  Theresa [name] is a 66 year old female with moderate pulmonary hypertension, COPD on 2.5 L, ongoing tobacco use, OSA on BiPAP for hypercapnia, chronic pain syndrome who presents with fatigue and shortness of breath.  She was discovered to be in atrial flutter with a heart rate of 150 prompting cardiology consultation.  # New onset atrial fibrillation/flutter Presented with fatigue and shortness of breath discovered to be in atrial flutter with a rate of 150.  She converted to atrial fibrillation that has  been rate controlled with diltiazem.  -CHA2DS2-VASc score = 3 (CHF, HTN, VASC) - continue Xarelto for stroke risk redcution -s/p IV diltiazem  and s/p diltiazem 120 mg daily , amiodarone gtt started 7/9, stopped at 3am due to hypotension and is not on any rate or rhythm control - went back into atrial flutter with rates 130-140 and felt anxious - restarted amiodarone drip on 7/10 - can change to PO dosing today - continue cardizem 28m PO q6h, consolidate to 661mBID tomorrow - s/p digoxin 0.5 x 2 doses, holding further due to supratheraputic levels - plan for possible TEE and DCCV at some point this week if her respiratory status continues to improve or on an outpatient basis after 3 weeks of uninterrupted AC  #Nonobstructive CAD Underwent heart catheterization in 2020 for evaluation of HFpEF and shortness of breath.  During this time was discovered to have a 50% mid LAD lesion which was felt to be nonsignificant. -Okay to hold aspirin in the setting of Xarelto use -Continue simvastatin 20 mg daily - LDL goal < 70  #HFpEF #Moderate pulmonary hypertension She does have some issues with volume overload chronically and takes spironolactone and torsemide for this.  This is all likely related to COPD and tobacco use as well as pulmonary hypertension. -ok to hold home spironolactone -Continue home torsemide  This patient's plan of care was discussed and created with Dr. KoNehemiah Massednd he is in agreement.    Signed: LiTristan Schroeder PA-C 04/27/2022, 10:31 AM   The patient feels relatively well today with no evidence of significant palpitations or worsening atrial flutter and/or rapid rate.  She is very slowly improving with her respiratory status which is the main concern for her current evidence of atrial flutter.  Heart rate is very well controlled of atrial flutter and therefore is stable.  We have discussed the possibility of transesophageal echocardiogram with cardioversion although we  may want her to have better respiratory status before this occurs.  We are still waiting for her respiratory status to slightly improved.  When significantly improved we will consider the possibility of cardioversion  The patient has been interviewed and examined. I agree with assessment and plan above. BrSerafina RoyalsD FAAndroscoggin Valley Hospital

## 2022-04-27 NOTE — Progress Notes (Signed)
  Progress Note   Patient: Theresa [name] RCV:[id number] DOB: 1956/08/23 DOA: 04/23/2022     4 DOS: the patient was seen and examined on 04/27/2022   Brief hospital course: 66 year old female past medical history of diastolic congestive heart failure, hypertension chronic respiratory failure on 2.5 L of oxygen, COPD, GERD, hypothyroidism, wears BiPAP at night.  She presented with shortness of breath and fatigue.  She was found to be in atrial flutter with rapid ventricular response.  Initially tried on Cardizem drip but blood pressure continued to go low and switched over to amiodarone drip.  She had worsening respiratory status on 04/24/2022 requiring high flow nasal cannula and transferred to the stepdown unit.  I started Solu-Medrol on 04/24/2022 for COPD exacerbation.  Patient's respiratory status gradually improving.  Cardiology planning on a TEE cardioversion once stable.  On Xarelto for anticoagulation.  Assessment and Plan: New onset atrial flutter with rapid ventricular response. Chronic diastolic congestive heart failure. Pulmonary artery hypertension. Patient is followed by cardiology, heart rate is better controlled, but still in atrial flutter.  Cardiology is planning for cardioversion tomorrow.  Continue anticoagulation.  Acute on chronic respiratory failure with hypoxemia and hypercapnia. COPD exacerbation. Patient is still on steroids, oxygen nation is better, currently on 4 L of oxygen which is higher than baseline. Continue to follow. Patient also complaining some headache, sore throat and a dry cough.  We will send testing for COVID.  Depression and anxiety. On Zoloft, anxiety has been affecting patient breathing, continue  low dose benzodiazepine.      Subjective:  Patient complaining of sore throat, some headache and a dry cough. Short of breath seem to be improving, still on 4 L oxygen.  Physical Exam: Vitals:   04/27/22 0106 04/27/22 0452 04/27/22 0741 04/27/22 1006   BP:  124/76  107/76  Pulse:  71  90  Resp: (!) _0 Temp:  97.6 F (36.4 C)  98.4 F (36.9 C)  TempSrc:  Oral  Oral  SpO2:  95% 95% 95%  Weight:      Height:       General exam: Appears calm and comfortable  Respiratory system: Decreased breath sounds with a few crackles in the right base. Respiratory effort normal. Cardiovascular system: Irregular. No JVD, murmurs, rubs, gallops or clicks. No pedal edema. Gastrointestinal system: Abdomen is nondistended, soft and nontender. No organomegaly or masses felt. Normal bowel sounds heard. Central nervous system: Alert and oriented. No focal neurological deficits. Extremities: Symmetric 5 x 5 power. Skin: No rashes, lesions or ulcers Psychiatry: Judgement and insight appear normal. Mood & affect appropriate.   Data Reviewed:  Lab results reviewed.  Family Communication:   Disposition: Status is: Inpatient Remains inpatient appropriate because: Severity of disease, pending inpatient procedure.  Planned Discharge Destination: Home    Time spent: 35 minutes  Author: Sharen Hones, MD 04/27/2022 11:27 AM  For on call review [email or web address].

## 2022-04-27 NOTE — Progress Notes (Signed)
Nasal swab sent to lab, airborne/droplet precautions in place pending results

## 2022-04-27 NOTE — Consult Note (Signed)
   [facility] CM Inpatient Consult   04/27/2022  Theresa [name] 03-20-1956 [id number]  [facility] [ACO] Patient: Theresa [name] Medicare  *Remote coverage - referral for unplanned readmission review, patient at [facility]  Primary Care Provider:  Birdie Sons, MD, [facility], is an embedded provider with a Chronic Care Management team and program, and is listed for the transition of care follow up and appointments.  Patient is high risk for unplanned readmission. Patient has an Embedded RNCM noted on her care teams. Patient is currently being recommended for home with home health   Plan: Continue to follow and notification sent to the [facility] and will send updates for  [facility] needs for post hospital needs.  Please contact for further questions,  Natividad Brood, RN BSN [facility] Liaison  [phone] business mobile phone Toll free office [phone]  Fax number: [phone] [email or web address]

## 2022-04-27 NOTE — Progress Notes (Signed)
04/27/22 1547  Assess: MEWS Score  Temp 98.4 F (36.9 C)  BP 116/71  MAP (mmHg) 80  Pulse Rate (!) 126  ECG Heart Rate (!) 118  Resp 18  SpO2 93 %  O2 Device Nasal Cannula  O2 Flow Rate (L/min) 4 L/min  Assess: MEWS Score  MEWS Temp 0  MEWS Systolic 0  MEWS Pulse 2  MEWS RR 0  MEWS LOC 0  MEWS Score 2  MEWS Score Color Yellow  Assess: if the MEWS score is Yellow or Red  Were vital signs taken at a resting state? Yes  Focused Assessment Change from prior assessment (see assessment flowsheet)  Does the patient meet 2 or more of the SIRS criteria? No  MEWS guidelines implemented *See Row Information* Yes  Treat  MEWS Interventions Administered prn meds/treatments;Escalated (See documentation below)  Pain Scale 0-10  Pain Score 0  Complains of Anxiety  Neuro symptoms relieved by Anti-anxiety medication  Take Vital Signs  Increase Vital Sign Frequency  Yellow: Q 2hr X 2 then Q 4hr X 2, if remains yellow, continue Q 4hrs  Escalate  MEWS: Escalate Yellow: discuss with charge nurse/RN and consider discussing with provider and RRT  Notify: Charge Nurse/RN  Name of Charge Nurse/RN Notified [place], RN  Date Charge Nurse/RN Notified 04/27/22  Time Charge Nurse/RN Notified 9022  Notify: Provider  Provider Name/Title Roosevelt Locks, MD  Date Provider Notified 04/27/22  Time Provider Notified 1355  Method of Notification  (secure chat)  Notification Reason Other (Comment) (HR)  Provider response See new orders  Date of Provider Response 04/27/22  Time of Provider Response 1354  Document  Patient Outcome Stabilized after interventions (HR 100 after IV metoprolol)  Progress note created (see row info) Yes  Assess: SIRS CRITERIA  SIRS Temperature  0  SIRS Pulse 1  SIRS Respirations  0  SIRS WBC 0  SIRS Score Sum  1

## 2022-04-28 ENCOUNTER — Encounter
Admission: EM | Disposition: A | Discharge status: Home Health | Payer: Self-pay | Source: Home / Self Care | Attending: Internal Medicine

## 2022-04-28 DIAGNOSIS — J9621 Acute and chronic respiratory failure with hypoxia: Secondary | ICD-10-CM | POA: Diagnosis not present

## 2022-04-28 DIAGNOSIS — J9622 Acute and chronic respiratory failure with hypercapnia: Secondary | ICD-10-CM | POA: Diagnosis not present

## 2022-04-28 DIAGNOSIS — J441 Chronic obstructive pulmonary disease with (acute) exacerbation: Secondary | ICD-10-CM | POA: Diagnosis not present

## 2022-04-28 DIAGNOSIS — I4892 Unspecified atrial flutter: Secondary | ICD-10-CM | POA: Diagnosis not present

## 2022-04-28 SURGERY — CARDIOVERSION
Anesthesia: General

## 2022-04-28 NOTE — Progress Notes (Signed)
[facility] [place] up in room. Patient instructed to call RN and ask for respiratory when she is ready to go to sleep. RN notified that patient will be calling for that purpose.

## 2022-04-28 NOTE — Progress Notes (Signed)
  Progress Note   Patient: Theresa [name] UAU:[id number] DOB: [birth year] DOA: 04/23/2022     5 DOS: the patient was seen and examined on 04/28/2022   Brief hospital course: 66 year old female past medical history of diastolic congestive heart failure, hypertension chronic respiratory failure on 2.5 L of oxygen, COPD, GERD, hypothyroidism, wears BiPAP at night.  She presented with shortness of breath and fatigue.  She was found to be in atrial flutter with rapid ventricular response.  Initially tried on Cardizem drip but blood pressure continued to go low and switched over to amiodarone drip.  She had worsening respiratory status on 04/24/2022 requiring high flow nasal cannula and transferred to the stepdown unit.  I started Solu-Medrol on 04/24/2022 for COPD exacerbation.  Patient's respiratory status gradually improving.  Cardiology planning on a TEE cardioversion once stable.  On Xarelto for anticoagulation.  Assessment and Plan: New onset atrial flutter with rapid ventricular response. Chronic diastolic congestive heart failure. Pulmonary artery hypertension. Condition stable, cardiology has scheduled a cardioversion today.  Continue anticoagulation.   Acute on chronic respiratory failure with hypoxemia and hypercapnia. COPD exacerbation. COVID-negative, still on 4L oxygen, will wean oxygen as patient clinically stable. Continue steroids   Depression and anxiety. On Zoloft and benzodiazepine low-dose      Subjective:  Patient feels better, short of breath slightly improved.  Still on 4 L oxygen.  Cough, not productive  Physical Exam: Vitals:   04/28/22 0600 04/28/22 0752 04/28/22 0904 04/28/22 1206  BP:  101/65  (!) 108/57  Pulse:  82  61  Resp: 16     Temp:  97.9 F (36.6 C)  98.4 F (36.9 C)  TempSrc:  Oral  Oral  SpO2:  97% 97% 91%  Weight:      Height:       General exam: Appears calm and comfortable  Respiratory system: Decreased breath sounds. Respiratory effort  normal. Cardiovascular system: Appear regular. No JVD, murmurs, rubs, gallops or clicks. No pedal edema. Gastrointestinal system: Abdomen is nondistended, soft and nontender. No organomegaly or masses felt. Normal bowel sounds heard. Central nervous system: Alert and oriented. No focal neurological deficits. Extremities: Symmetric 5 x 5 power. Skin: No rashes, lesions or ulcers Psychiatry: Judgement and insight appear normal. Mood & affect appropriate.   Data Reviewed:  Results reviewed  Family Communication:   Disposition: Status is: Inpatient Remains inpatient appropriate because: Severity of disease, pending procedure  Planned Discharge Destination: Home with Home Health    Time spent: 35 minutes  Author: Sharen Hones, MD 04/28/2022 1:19 PM  For on call review [email or web address].

## 2022-04-28 NOTE — Progress Notes (Addendum)
Physical Therapy Treatment Patient Details Name: Theresa [name] MRN: [id number] DOB: 01-02-1956 Today's Date: 04/28/2022   History of Present Illness 66 yo female who presented to [facility] on 07/8 with elevated heart rate.  According to the pt she checked her heart rate via a cardiac monitor at home that routes to her outpatient provider, and was instructed to proceed to urgent care or the ER for further evaluation due to heart rate reading between 150 to 160's.    PT Comments    Patient easily wakes to voice, agreeable to PT session. Primary limitations remain HR and spO2. HR in 130s-140s with activity, with a seated rest break does return to 90s. Pt on 4L initially, but with ambulation dropped to 82%, unable to recover with standing rest and PLB, placed on 6L for remainder. She did ambulate ~55f with supervision, no AD, and fixed her bed blankets prior to sitting. Up in chair back on 4L with spO2 88-89% RN notified of pt status. The patient would benefit from further skilled PT intervention to continue to progress towards goals. Recommendation remains appropriate.       Recommendations for follow up therapy are one component of a multi-disciplinary discharge planning process, led by the attending physician.  Recommendations may be updated based on patient status, additional functional criteria and insurance authorization.  Follow Up Recommendations  Home health PT     Assistance Recommended at Discharge None  Patient can return home with the following Assistance with cooking/housework;Assist for transportation;Help with stairs or ramp for entrance   Equipment Recommendations  None recommended by PT    Recommendations for Other Services       Precautions / Restrictions Precautions Precautions: Fall Restrictions Weight Bearing Restrictions: No     Mobility  Bed Mobility Overal bed mobility: Independent                  Transfers Overall transfer level: Needs  assistance Equipment used: None Transfers: Sit to/from Stand Sit to Stand: Supervision                Ambulation/Gait Ambulation/Gait assistance: Supervision Gait Distance (Feet): 90 Feet Assistive device: Rolling walker (2 wheels)             Stairs             Wheelchair Mobility    Modified Rankin (Stroke Patients Only)       Balance Overall balance assessment: Needs assistance Sitting-balance support: Feet supported Sitting balance-Leahy Scale: Good       Standing balance-Leahy Scale: Good                              Cognition Arousal/Alertness: Awake/alert Behavior During Therapy: WFL for tasks assessed/performed Overall Cognitive Status: Within Functional Limits for tasks assessed                                          Exercises      General Comments        Pertinent Vitals/Pain Pain Assessment Pain Assessment: No/denies pain    Home Living                          Prior Function            PT Goals (current goals can now  be found in the care plan section) Progress towards PT goals: Progressing toward goals    Frequency    Min 2X/week      PT Plan Current plan remains appropriate    Co-evaluation              AM-PAC PT "6 Clicks" Mobility   Outcome Measure  Help needed turning from your back to your side while in a flat bed without using bedrails?: None Help needed moving from lying on your back to sitting on the side of a flat bed without using bedrails?: None Help needed moving to and from a bed to a chair (including a wheelchair)?: None Help needed standing up from a chair using your arms (e.g., wheelchair or bedside chair)?: None Help needed to walk in hospital room?: None Help needed climbing 3-5 steps with a railing? : A Little 6 Click Score: 23    End of Session Equipment Utilized During Treatment: Gait belt Activity Tolerance: Patient tolerated treatment  well;Other (comment) (limited by spO2, HR) Patient left: in chair;with call bell/phone within reach;with nursing/sitter in room Nurse Communication: Mobility status PT Visit Diagnosis: Muscle weakness (generalized) (M62.81);Difficulty in walking, not elsewhere classified (R26.2)     Time: 1287-8676 PT Time Calculation (min) (ACUTE ONLY): 26 min  Charges:  $Therapeutic Activity: 23-37 mins                     Lieutenant Diego PT, DPT 2:18 PM,04/28/22

## 2022-04-28 NOTE — Progress Notes (Signed)
[facility] Cardiology  CARDIOLOGY CONSULT NOTE  Patient ID: Theresa [name] MRN: [id number] DOB/AGE: 03-27-1956 66 y.o.  Admit date: 04/23/2022 Referring Physician Cordelia Poche Primary Physician Caryn Section, Kirstie Peri, MD Primary Cardiologist [facility] Reason for Consultation New onset atrial flutter/fibrillation  HPI:  Theresa [name] is a 66 year old female with moderate pulmonary hypertension, COPD on 2.5 L, ongoing tobacco use, OSA on BiPAP for hypercapnia, chronic pain syndrome who presents with fatigue and shortness of breath.  She was discovered to be in atrial flutter with a heart rate of 150 prompting cardiology consultation.  Interval History: - remains in atrial flutter with variable conduction, rate controlled in the 90s mostly (increases with ambulation)  - remains on 4L by [place] - new dry cough today, but overall feels better with less wheezing -Have discussed further consideration of electrical cardioversion of atrial flutter to normal sinus rhythm patient has all risk and benefits discussed and agrees Review of systems complete and found to be negative unless listed above     Past Medical History:  Diagnosis Date   Acute postoperative pain 10/03/2017   Anxiety    BiPAP (biphasic positive airway pressure) dependence    at hs   Carpal tunnel syndrome    CHF (congestive heart failure) ([place])    1/18   CHF (congestive heart failure) (H[facility])    Chronic generalized abdominal pain    Chronic rhinitis    COPD (chronic obstructive pulmonary disease) (H[facility])    DDD (degenerative disc disease), cervical    DDD (degenerative disc disease), lumbosacral    Depression    Dyspnea    Edema    Flu    1/18   Gastritis    GERD (gastroesophageal reflux disease)    Hematuria    Hernia of abdominal wall 07/17/2018   Hernia, abdominal    Hypertension    Kidney stones    Low back pain    Lumbar radiculitis    Malodorous urine    Muscle weakness    Obesity    Oxygen dependent     Pneumonia due to COVID-19 virus 02/03/2020   Renal cyst    Sensory urge incontinence    Thyroid activity decreased    9/19   Tobacco abuse    Wheezing     Past Surgical History:  Procedure Laterality Date   ABDOMINAL HYSTERECTOMY     CARPAL TUNNEL RELEASE Left 2012   EPIGASTRIC HERNIA REPAIR N/A 08/13/2018   HERNIA REPAIR EPIGASTRIC ADULT; polypropylene mesh reinforcement.  Surgeon: Robert Bellow, MD;  Location: [facility] ORS;  Service: General;  Laterality: N/A;   RIGHT/LEFT HEART CATH AND CORONARY ANGIOGRAPHY N/A 07/11/2019   Procedure: RIGHT/LEFT HEART CATH AND CORONARY ANGIOGRAPHY;  Surgeon: Yolonda Kida, MD;  Location: [facility];  Service: Cardiovascular;  Laterality: N/A;   TONSILLECTOMY     TUBAL LIGATION     UMBILICAL HERNIA REPAIR N/A 08/13/2018   HERNIA REPAIR UMBILICAL ADULT; polypropylene mesh reinforcement Surgeon: Robert Bellow, MD;  Location: [facility] ORS;  Service: General;  Laterality: N/A;    Medications Prior to Admission  Medication Sig Dispense Refill Last Dose   acetaminophen (TYLENOL) 500 MG tablet Take 1,000 mg by mouth every 8 (eight) hours as needed.   prn at prn   albuterol (VENTOLIN HFA) 108 (90 Base) MCG/ACT inhaler Inhale 2 puffs into the lungs every 6 (six) hours as needed for wheezing or shortness of breath. 8 g 1 prn at prn   ambrisentan (LETAIRIS) 5 MG tablet  Take 5 mg by mouth daily.   04/23/2022 at 1000   aspirin EC 81 MG tablet Take 81 mg by mouth daily.   04/23/2022 at 1000   Besifloxacin HCl (BESIVANCE) 0.6 % SUSP Place 1 drop into the right eye 3 (three) times daily. For 2 weeks starting 03/29/22   04/23/2022 at 1000   Bromfenac Sodium (PROLENSA) 0.07 % SOLN Place 1 drop into the right eye at bedtime. For 4 weeks starting 03/29/22   04/23/2022 at 1000   butalbital-acetaminophen-caffeine (FIORICET) 50-325-40 MG tablet Take 1 tablet by mouth every 6 (six) hours as needed for headache. LIMIT USE TO PREVENT MEDICATION OVERUSE HEADACHE 14 tablet  0 prn at prn   Cholecalciferol 50 MCG (2000 UT) CAPS Take 2,000 Units by mouth daily.    04/23/2022 at 1000   dextromethorphan-guaiFENesin (MUCINEX DM) 30-600 MG 12hr tablet Take 1 tablet by mouth 2 (two) times daily as needed for cough.   prn at prn   Difluprednate 0.05 % EMUL Place 1 drop into the right eye in the morning, at noon, in the evening, and at bedtime. One drop 4 times daily for one week starting 03/29/22. One drop 3 times daily for one week. One drop 2 times daily for one week. One drop once daily for one week, then stop.   04/23/2022 at 1000   Fluticasone-Umeclidin-Vilant 100-62.5-25 MCG/INH AEPB Inhale 1 puff into the lungs daily.   04/23/2022 at 1000   [START ON 05/01/2022] HYDROcodone-acetaminophen (NORCO/VICODIN) 5-325 MG tablet Take 1 tablet by mouth every 8 (eight) hours as needed for severe pain. Must last 30 days 90 tablet 0 prn at prn   ipratropium-albuterol (DUONEB) 0.5-2.5 (3) MG/3ML SOLN USE 1 VIAL VIA NEBULIZER EVERY 6 HOURS AS NEEDED FOR SHORTNESS OF BREATH OR WHEEZING 1080 mL 0 prn at prn   levothyroxine (SYNTHROID) 25 MCG tablet TAKE 1 TABLET(25 MCG) BY MOUTH DAILY BEFORE BREAKFAST 90 tablet 1 04/23/2022 at 1000   nicotine (NICODERM CQ - DOSED IN MG/24 HOURS) 21 mg/24hr patch Place 1 patch (21 mg total) onto the skin daily. 28 patch 0 Past Week at unknown   OXYGEN Inhale 2.5 L/min into the lungs.   04/23/2022 at unknown   potassium chloride SA (KLOR-CON M) 20 MEQ tablet Take 20-40 mEq by mouth 2 (two) times daily. Take 40 mEq in the morning and 20 mEq in the evening.   04/23/2022 at 1000   roflumilast (DALIRESP) 500 MCG TABS tablet Take 500 mcg by mouth daily.   04/23/2022 at 1000   sertraline (ZOLOFT) 100 MG tablet TAKE 2 TABLETS BY MOUTH ONCE EVERY MORNING 180 tablet 1 04/23/2022 at 1000   simvastatin (ZOCOR) 20 MG tablet Take 20 mg by mouth at bedtime.   04/22/2022 at 2200   spironolactone (ALDACTONE) 50 MG tablet Take 1 tablet (50 mg total) by mouth daily. 30 tablet 0 04/23/2022 at 1000    torsemide (DEMADEX) 20 MG tablet Take 2 tablets (40 mg total) by mouth 2 (two) times daily. Take 4 tablets QAM and 2 tablets QPM (Patient taking differently: Take 40-80 mg by mouth 2 (two) times daily. Take 4 tablets QAM and 2 tablets QPM)   04/23/2022 at 1000   [START ON 06/30/2022] HYDROcodone-acetaminophen (NORCO/VICODIN) 5-325 MG tablet Take 1 tablet by mouth every 8 (eight) hours as needed for severe pain. Must last 30 days 90 tablet 0     Social History   Socioeconomic History   Marital status: Married    Spouse name: Ovid Curd  Number of children: 2   Years of education: Not on file   Highest education level: 12th grade  Occupational History   Occupation: disability  Tobacco Use   Smoking status: Every Day    Packs/day: 0.50    Years: 44.00    Total pack years: 22.00    Types: Cigarettes   Smokeless tobacco: Never   Tobacco comments:    over a half pack  Vaping Use   Vaping Use: Never used  Substance and Sexual Activity   Alcohol use: No   Drug use: Yes    Comment: prescribed hydrocodone   Sexual activity: Not Currently    Birth control/protection: Surgical  Other Topics Concern   Not on file  Social History Narrative   ** Merged History Encounter **       Lives with spouse    Social Determinants of Health   Financial Resource Strain: Low Risk  (09/20/2021)   Overall Financial Resource Strain (CARDIA)    Difficulty of Paying Living Expenses: Not very hard  Food Insecurity: No Food Insecurity (12/14/2021)   Hunger Vital Sign    Worried About Running Out of Food in the Last Year: Never true    Ran Out of Food in the Last Year: Never true  Transportation Needs: No Transportation Needs (12/14/2021)   PRAPARE - Transportation    Lack of Transportation (Medical): No    Lack of Transportation (Non-Medical): No  Physical Activity: Inactive (09/20/2021)   Exercise Vital Sign    Days of Exercise per Week: 0 days    Minutes of Exercise per Session: 0 min  Stress: No  Stress Concern Present (09/20/2021)   [place]    Feeling of Stress : Not at all  Social Connections: Moderately Integrated (09/20/2021)   Social Connection and Isolation Panel [NHANES]    Frequency of Communication with Friends and Family: More than three times a week    Frequency of Social Gatherings with Friends and Family: Once a week    Attends Religious Services: More than 4 times per year    Active Member of [employer] or Organizations: No    Attends Archivist Meetings: Never    Marital Status: Married  Human resources officer Violence: Not At Risk (01/22/2020)   Humiliation, Afraid, Rape, and Kick questionnaire    Fear of Current or Ex-Partner: No    Emotionally Abused: No    Physically Abused: No    Sexually Abused: No    Family History  Problem Relation Age of Onset   Heart disease Mother    Stroke Mother    Coronary artery disease Mother    Lung cancer Sister    Cancer Sister    Breast cancer Sister    Alcohol abuse Father    Heart disease Father    Cancer Brother    Cancer Brother    Pneumonia Brother    Prostate cancer Neg Hx    Kidney cancer Neg Hx    Bladder Cancer Neg Hx      PHYSICAL EXAM  General: Pleasant elderly caucasian female, well nourished, in no acute distress HEENT:  Normocephalic and atraumatic Neck:  No JVD.  Lungs: Normal respiratory effort on [place] oxygen. Better aeration today with trace expiratory wheeze Heart: Irregularly irregular . Normal S1 and S2 without gallops or murmurs.  Abdomen: Obese appearing Msk:  Normal strength and tone for age. Extremities: No clubbing, cyanosis or edema.   Neuro: Alert and  oriented X 3. Psych:  Good affect, responds appropriately  Labs:   Lab Results  Component Value Date   WBC 9.3 04/27/2022   HGB 13.5 04/27/2022   HCT 43.7 04/27/2022   MCV 98.9 04/27/2022   PLT 192 04/27/2022    Recent Labs  Lab 04/23/22 1814 04/25/22 0505  04/27/22 0320  NA  --    < > 138  K  --    < > 4.4  CL  --    < > 98  CO2  --    < > 32  BUN  --    < > 30*  CREATININE  --    < > 0.90  CALCIUM  --    < > 9.1  PROT 6.8  --   --   BILITOT 0.5  --   --   ALKPHOS 66  --   --   ALT 24  --   --   AST 23  --   --   GLUCOSE  --    < > 155*   < > = values in this interval not displayed.    Lab Results  Component Value Date   CKTOTAL 85 10/11/2019   CKMB 1.0 12/27/2012   TROPONINI <0.03 02/07/2018    Lab Results  Component Value Date   CHOL 175 04/08/2022   CHOL 241 (H) 06/10/2021   Lab Results  Component Value Date   HDL 58 04/08/2022   HDL 36 (L) 06/10/2021   Lab Results  Component Value Date   LDLCALC 86 04/08/2022   LDLCALC 171 (H) 06/10/2021   Lab Results  Component Value Date   TRIG 154 (H) 04/08/2022   TRIG 184 (H) 06/10/2021   Lab Results  Component Value Date   CHOLHDL 3.0 04/08/2022   No results found for: "LDLDIRECT"    Radiology: [facility] Chest Port 1 View  Result Date: 04/26/2022 CLINICAL DATA:  Wheezing, on CPAP, history COPD, CHF, hypertension, smoker EXAM: PORTABLE CHEST 1 VIEW COMPARISON:  Portable exam 0558 hours compared to 04/24/2022 FINDINGS: Enlargement of cardiac silhouette with pulmonary vascular congestion. Atherosclerotic calcification aorta. Linear subsegmental atelectasis at lower lungs bilaterally. No acute infiltrate, pleural effusion, or pneumothorax. IMPRESSION: Enlargement of cardiac silhouette with pulmonary vascular congestion. Bibasilar atelectasis. Electronically Signed   By: Lavonia Dana M.D.   On: 04/26/2022 08:13   DG Chest Port 1 View  Result Date: 04/24/2022 CLINICAL DATA:  Shortness of breath. EXAM: PORTABLE CHEST 1 VIEW COMPARISON:  Chest x-ray 04/23/2022. FINDINGS: Similar mild diffuse interstitial prominence. No consolidation. No visible pleural effusions or pneumothorax. Similar cardiomegaly and pulmonary vascular congestion. IMPRESSION: 1. Similar mild diffuse interstitial  prominence, which could represent mild interstitial edema versus the sequela of recurrent bouts of CHF. 2. Similar cardiomegaly and pulmonary vascular congestion. Electronically Signed   By: Margaretha Sheffield M.D.   On: 04/24/2022 14:07   DG Chest 2 View  Result Date: 04/23/2022 CLINICAL DATA:  Chest pain EXAM: CHEST - 2 VIEW COMPARISON:  04/05/2022 FINDINGS: Cardiomegaly. Both lungs are clear. Emphysema. Disc degenerative disease of the thoracic spine. IMPRESSION: 1.  Cardiomegaly without acute abnormality of the lungs. 2.  Emphysema. Electronically Signed   By: Delanna Ahmadi M.D.   On: 04/23/2022 16:43   CT Angio Chest Pulmonary Embolism (PE) W or WO Contrast  Result Date: 04/09/2022 CLINICAL DATA:  PE suspected, tachycardia EXAM: CT ANGIOGRAPHY CHEST WITH CONTRAST TECHNIQUE: Multidetector CT imaging of the chest was performed using the standard protocol  during bolus administration of intravenous contrast. Multiplanar CT image reconstructions and MIPs were obtained to evaluate the vascular anatomy. RADIATION DOSE REDUCTION: This exam was performed according to the departmental dose-optimization program which includes automated exposure control, adjustment of the mA and/or kV according to patient size and/or use of iterative reconstruction technique. CONTRAST:  39m OMNIPAQUE IOHEXOL 350 MG/ML SOLN COMPARISON:  None Available. FINDINGS: Cardiovascular: Examination for pulmonary embolism is limited by breath motion artifact within this limitation, no evidence of pulmonary embolism through the proximal segmental pulmonary arterial level no evidence of pulmonary embolism. Mild cardiomegaly three-vessel coronary artery calcifications. No pericardial effusion. Aortic atherosclerosis. Mediastinum/Nodes: No enlarged mediastinal, hilar, or axillary lymph nodes. Thyroid gland, trachea, and esophagus demonstrate no significant findings. Lungs/Pleura: Moderate emphysema. Near-total atelectasis or consolidation of the  right middle lobe (series 6, image 57). Bandlike scarring or atelectasis of the bilateral lung bases. No pleural effusion or pneumothorax. Upper Abdomen: No acute abnormality. Multiple low-attenuation lesions throughout the liver, incompletely characterized although likely benign simple cysts. Musculoskeletal: No chest wall abnormality. No acute osseous findings. Review of the MIP images confirms the above findings. IMPRESSION: 1. Examination for pulmonary embolism is limited by breath motion artifact. Within this limitation, no evidence of pulmonary embolism through the proximal segmental pulmonary arterial level. 2. Near-total atelectasis or consolidation of the right middle lobe, of uncertain chronicity. Consider bronchoscopic evaluation. 3. Additional bandlike scarring or atelectasis of the bilateral lung bases. 4. Moderate emphysema. 5. Coronary artery disease. Aortic Atherosclerosis (ICD10-I70.0) and Emphysema (ICD10-J43.9). Electronically Signed   By: ADelanna AhmadiM.D.   On: 04/09/2022 15:13   MR BRAIN WO CONTRAST  Result Date: 04/08/2022 CLINICAL DATA:  Initial evaluation for neuro deficit, stroke suspected. EXAM: MRI HEAD WITHOUT CONTRAST TECHNIQUE: Multiplanar, multiecho pulse sequences of the brain and surrounding structures were obtained without intravenous contrast. COMPARISON:  Prior study from earlier the same day. FINDINGS: Brain: Cerebral volume within normal limits. Patchy and hazy T2/FLAIR hyperintensity involving the periventricular it deep white matter both cerebral hemispheres as well as the pons, most consistent with chronic small vessel ischemic disease, moderately advanced in nature. Prominent involvement of the bilateral basal ganglia and thalami noted as well. No evidence for acute or subacute infarct. Gray-white matter differentiation maintained. No areas of chronic cortical infarction. No acute or chronic intracranial blood products. No mass lesion, midline shift or mass effect. No  hydrocephalus or extra-axial fluid collection. Pituitary gland suprasellar region within normal limits. Vascular: Major intracranial vascular flow voids are maintained. Skull and upper cervical spine: Mild cerebellar tonsillar ectopia without frank Chiari malformation. Bone marrow signal intensity within normal limits. No scalp soft tissue abnormality. Sinuses/Orbits: Prior bilateral ocular lens replacement. Paranasal sinuses are largely clear. No significant mastoid effusion. Other: None. IMPRESSION: 1. No acute intracranial abnormality. 2. Moderately advanced chronic microvascular ischemic disease for age. Electronically Signed   By: BJeannine BogaM.D.   On: 04/08/2022 23:53   CT ANGIO HEAD NECK W WO CM  Result Date: 04/08/2022 CLINICAL DATA:  Acute neuro deficit. EXAM: CT ANGIOGRAPHY HEAD AND NECK TECHNIQUE: Multidetector CT imaging of the head and neck was performed using the standard protocol during bolus administration of intravenous contrast. Multiplanar CT image reconstructions and MIPs were obtained to evaluate the vascular anatomy. Carotid stenosis measurements (when applicable) are obtained utilizing NASCET criteria, using the distal internal carotid diameter as the denominator. RADIATION DOSE REDUCTION: This exam was performed according to the departmental dose-optimization program which includes automated exposure control, adjustment of the mA  and/or kV according to patient size and/or use of iterative reconstruction technique. CONTRAST:  36m OMNIPAQUE IOHEXOL 350 MG/ML SOLN COMPARISON:  CT head 04/01/2022 FINDINGS: CT HEAD FINDINGS Brain: Ventricle size normal. Hypodensity in the frontal white matter bilaterally unchanged. Negative for acute infarct, acute hemorrhage, or mass. Vascular: Negative for hyperdense vessel Skull: Negative Sinuses: Paranasal sinuses clear. Orbits: Bilateral cataract extraction. Review of the MIP images confirms the above findings CTA NECK FINDINGS Aortic arch:  Standard branching. Imaged portion shows no evidence of aneurysm or dissection. No significant stenosis of the major arch vessel origins. Mild atherosclerotic disease aortic arch and proximal great vessels Right carotid system: Mild atherosclerotic disease at the right carotid bifurcation without stenosis. Left carotid system: Mild atherosclerotic calcification left carotid bifurcation without stenosis Vertebral arteries: Both vertebral arteries are widely patent to the skull base without stenosis Skeleton: Mild cervical spondylosis. No acute skeletal abnormality. Periapical lucency around right upper molar. Other neck: Negative for mass or adenopathy Upper chest: Mild apical emphysema.  No acute abnormality Review of the MIP images confirms the above findings CTA HEAD FINDINGS Anterior circulation: Atherosclerotic calcification and mild stenosis in the cavernous carotid bilaterally. Anterior and middle cerebral arteries widely patent without stenosis or aneurysm. Posterior circulation: Both vertebral arteries patent to the basilar. PICA patent bilaterally. Basilar widely patent. AICA, superior cerebellar, posterior cerebral arteries patent. No stenosis or aneurysm. Fetal origin posterior cerebral artery bilaterally. Venous sinuses: Normal venous enhancement Anatomic variants: None Review of the MIP images confirms the above findings IMPRESSION: 1. CT head negative for acute abnormality. Chronic white matter changes bilaterally due to chronic ischemia. 2. Mild atherosclerotic disease carotid bifurcation bilaterally. No carotid or vertebral artery stenosis in the neck. Mild atherosclerotic stenosis in the cavernous carotid bilaterally. 3. No intracranial large vessel occlusion or flow limiting stenosis Electronically Signed   By: CFranchot GalloM.D.   On: 04/08/2022 11:28   MR CERVICAL SPINE WO CONTRAST  Result Date: 04/07/2022 CLINICAL DATA:  Cervical myelopathy EXAM: MRI CERVICAL SPINE WITHOUT CONTRAST  TECHNIQUE: Multiplanar, multisequence MR imaging of the cervical spine was performed. No intravenous contrast was administered. COMPARISON:  12/25/2007 MRI cervical spine 10/11/2019 CT cervical spine FINDINGS: Evaluation is somewhat limited by motion artifact. Alignment: Straightening and mild reversal of the normal cervical lordosis. No listhesis. Vertebrae: No acute fracture or suspicious osseous lesion. T1 and T2 hyperintense focus in the C5 vertebral body, most likely a benign hemangioma. Vertebral body heights are preserved. Congenitally short pedicles, which narrow the AP diameter of the spinal canal. Cord: Normal signal and morphology. Previously noted prominence of the central canal at the level of C7 is no longer seen. Posterior Fossa, vertebral arteries, paraspinal tissues: Negative. Disc levels: C2-C3: No significant disc bulge. No spinal canal stenosis or neural foraminal narrowing. C3-C4: Minimal disc bulge. Facet and uncovertebral hypertrophy, left-greater-than-right. No spinal canal stenosis. Mild-to-moderate right and severe left neural foraminal narrowing, which have progressed from the prior MRI . C4-C5: No significant disc bulge. Facet and uncovertebral hypertrophy. Moderate left and mild right neural foraminal narrowing, which have progressed from the prior exam. C5-C6: Minimal disc bulge. Ligamentum flavum hypertrophy. Facet and uncovertebral hypertrophy. Mild spinal canal stenosis, with severe right and moderate to severe left neural foraminal narrowing, which have progressed from the prior exam. C6-C7: Disc height loss and minimal disc bulge. Facet and uncovertebral hypertrophy. Ligamentum flavum hypertrophy. Mild spinal canal stenosis overall unchanged. Mild-to-moderate right and severe left neural foraminal narrowing, which have progressed from prior exam. C7-T1: No  significant disc bulge. No spinal canal stenosis or neural foraminal narrowing. IMPRESSION: 1. Evaluation is limited by motion  artifact. Within this limitation, there progressive degenerative changes compared to 2009, with mild spinal canal stenosis C4-C5 and C5-C6. 2. Multilevel facet and uncovertebral hypertrophy, which causes up to severe neural foraminal narrowing, which is seen on the left at C3-C4 and C6-C7 and on the right at C5-C6. Additional less significant neural foraminal narrowing, as described above. Electronically Signed   By: Merilyn Baba M.D.   On: 04/07/2022 00:59   DG Chest Port 1 View  Result Date: 04/05/2022 CLINICAL DATA:  Shortness of breath, hypoxia EXAM: PORTABLE CHEST 1 VIEW COMPARISON:  04/01/2022 FINDINGS: No significant change in AP portable examination. Cardiomegaly with mild, diffuse interstitial opacity and pulmonary vascular prominence. The visualized skeletal structures are unremarkable. IMPRESSION: No significant change in AP portable examination. Cardiomegaly with mild, diffuse interstitial opacity and pulmonary vascular prominence, findings most likely reflect edema. No new or focal airspace opacity. Electronically Signed   By: Delanna Ahmadi M.D.   On: 04/05/2022 09:32   MR BRAIN WO CONTRAST  Result Date: 04/04/2022 CLINICAL DATA:  Occipital region headache and scalp tenderness. EXAM: MRI HEAD WITHOUT CONTRAST TECHNIQUE: Multiplanar, multiecho pulse sequences of the brain and surrounding structures were obtained without intravenous contrast. COMPARISON:  Head CT 04/01/2022. Head MRI 10/11/2019 and 12/10/2017. FINDINGS: Brain: There is no evidence of an acute infarct, intracranial hemorrhage, mass, midline shift, or extra-axial fluid collection. Patchy T2 hyperintensities in the cerebral white matter, basal ganglia, thalami, and brainstem are stable to mildly increased compared to the prior MRI and are nonspecific but compatible with moderate chronic small vessel ischemic disease. The ventricles and sulci are within normal limits for age. Vascular: Major intracranial vascular flow voids are  preserved. Skull and upper cervical spine: Diffuse bone marrow heterogeneity including increased trace diffusion weighted signal intensity, chronic based on the 2019 MRI and nonspecific. No discrete focal lesion. Sinuses/Orbits: Bilateral cataract extraction. Paranasal sinuses and mastoid air cells are clear. Other: None. IMPRESSION: 1. No acute intracranial abnormality. 2. Moderate chronic small vessel ischemic disease. Electronically Signed   By: Logan Bores M.D.   On: 04/04/2022 14:30   ECHOCARDIOGRAM COMPLETE  Result Date: 04/02/2022    ECHOCARDIOGRAM REPORT   Patient Name:   Theresa [name] Date of Exam: 04/02/2022 Medical Rec #:  [id number]      Height:       62.0 in Accession #:    [id number]     Weight:       202.6 lb Date of Birth:  06/01/1956       BSA:          1.922 m Patient Age:    30 years       BP:           101/58 mmHg Patient Gender: F              HR:           93 bpm. Exam Location:  [facility] Procedure: 2D Echo Indications:     CHF I50.31  History:         Patient has prior history of Echocardiogram examinations, most                  recent 08/08/2021.  Sonographer:     Kathlen Brunswick RDCS Referring Phys:  [id number] Soledad Gerlach NIU Diagnosing Phys: Yolonda Kida MD IMPRESSIONS  1. Left ventricular ejection fraction, by estimation, is 55  to 60%. The left ventricle has normal function. The left ventricle has no regional wall motion abnormalities. Left ventricular diastolic parameters are consistent with Grade I diastolic dysfunction (impaired relaxation).  2. Right ventricular systolic function is low normal. The right ventricular size is mildly enlarged.  3. Left atrial size was mildly dilated.  4. Right atrial size was mildly dilated.  5. The mitral valve is normal in structure. Trivial mitral valve regurgitation.  6. The aortic valve is grossly normal. Aortic valve regurgitation is not visualized. Conclusion(s)/Recommendation(s): Poor windows for evaluation of left ventricular function by transthoracic  echocardiography. Would recommend an alternative means of evaluation. FINDINGS  Left Ventricle: Left ventricular ejection fraction, by estimation, is 55 to 60%. The left ventricle has normal function. The left ventricle has no regional wall motion abnormalities. The left ventricular internal cavity size was normal in size. There is  no left ventricular hypertrophy. Left ventricular diastolic parameters are consistent with Grade I diastolic dysfunction (impaired relaxation). Right Ventricle: The right ventricular size is mildly enlarged. No increase in right ventricular wall thickness. Right ventricular systolic function is low normal. Left Atrium: Left atrial size was mildly dilated. Right Atrium: Right atrial size was mildly dilated. Pericardium: There is no evidence of pericardial effusion. Mitral Valve: The mitral valve is normal in structure. Trivial mitral valve regurgitation. Tricuspid Valve: The tricuspid valve is normal in structure. Tricuspid valve regurgitation is trivial. Aortic Valve: The aortic valve is grossly normal. Aortic valve regurgitation is not visualized. Aortic valve mean gradient measures 8.0 mmHg. Aortic valve peak gradient measures 15.1 mmHg. Aortic valve area, by VTI measures 2.58 cm. Pulmonic Valve: The pulmonic valve was normal in structure. Pulmonic valve regurgitation is not visualized. Aorta: The ascending aorta was not well visualized. IAS/Shunts: No atrial level shunt detected by color flow Doppler.  LEFT VENTRICLE PLAX 2D LVIDd:         4.55 cm      Diastology LVIDs:         3.12 cm      LV e' medial:    9.90 cm/s LV PW:         1.10 cm      LV E/e' medial:  10.0 LV IVS:        0.85 cm      LV e' lateral:   12.30 cm/s LVOT diam:     2.10 cm      LV E/e' lateral: 8.1 LV SV:         95 LV SV Index:   49 LVOT Area:     3.46 cm  LV Volumes (MOD) LV vol d, MOD A4C: 105.0 ml LV vol s, MOD A4C: 39.0 ml LV SV MOD A4C:     105.0 ml RIGHT VENTRICLE RV Basal diam:  4.04 cm RV S prime:      14.40 cm/s TAPSE (M-mode): 2.8 cm LEFT ATRIUM             Index        RIGHT ATRIUM           Index LA diam:        4.30 cm 2.24 cm/m   RA Area:     29.70 cm LA Vol (A2C):   97.2 ml 50.57 ml/m  RA Volume:   110.00 ml 57.23 ml/m LA Vol (A4C):   45.2 ml 23.51 ml/m LA Biplane Vol: 66.4 ml 34.54 ml/m  AORTIC VALVE  PULMONIC VALVE AV Area (Vmax):    2.50 cm      PV Vmax:       1.06 m/s AV Area (Vmean):   2.45 cm      PV Peak grad:  4.5 mmHg AV Area (VTI):     2.58 cm AV Vmax:           194.00 cm/s AV Vmean:          128.000 cm/s AV VTI:            0.367 m AV Peak Grad:      15.1 mmHg AV Mean Grad:      8.0 mmHg LVOT Vmax:         140.00 cm/s LVOT Vmean:        90.700 cm/s LVOT VTI:          0.273 m LVOT/AV VTI ratio: 0.74  AORTA Ao Root diam: 3.00 cm MITRAL VALVE                TRICUSPID VALVE MV Area (PHT): 6.27 cm     TV Peak grad:   43.3 mmHg MV Decel Time: 121 msec     TV Vmax:        3.29 m/s MV E velocity: 99.40 cm/s MV A velocity: 116.00 cm/s  SHUNTS MV E/A ratio:  0.86         Systemic VTI:  0.27 m                             Systemic Diam: 2.10 cm Dwayne Prince Rome MD Electronically signed by Yolonda Kida MD Signature Date/Time: 04/02/2022/3:26:17 PM    Final    CT HEAD WO CONTRAST (5MM)  Result Date: 04/01/2022 CLINICAL DATA:  66 year old female presents with new or worsening headache. EXAM: CT HEAD WITHOUT CONTRAST TECHNIQUE: Contiguous axial images were obtained from the base of the skull through the vertex without intravenous contrast. RADIATION DOSE REDUCTION: This exam was performed according to the departmental dose-optimization program which includes automated exposure control, adjustment of the mA and/or kV according to patient size and/or use of iterative reconstruction technique. COMPARISON:  Comparison is made with October 11, 2019. FINDINGS: Brain: No evidence of acute infarction, hemorrhage, hydrocephalus, extra-axial collection or mass lesion/mass effect.  Signs of mild atrophy and chronic microvascular ischemic change which is similar to previous imaging Vascular: No hyperdense vessel or unexpected calcification. Skull: Normal. Negative for fracture or focal lesion. Sinuses/Orbits: Visualized paranasal sinuses and orbits show no acute process to the extent evaluated. Other: None IMPRESSION: 1. No acute intracranial abnormality. 2. Signs of mild chronic microvascular ischemic change which is similar to previous imaging. Electronically Signed   By: Zetta Bills M.D.   On: 04/01/2022 14:07   DG Chest Port 1 View  Result Date: 04/01/2022 CLINICAL DATA:  Questionable sepsis.  Shortness of breath. EXAM: PORTABLE CHEST 1 VIEW COMPARISON:  Chest radiographs 08/06/2021 FINDINGS: The cardiac silhouette is upper limits of normal in size. There is unchanged enlargement of the pulmonary arteries. Aortic atherosclerosis is noted. The lungs are hyperinflated with chronic peribronchial thickening and underlying emphysema. The interstitial markings are slightly more prominent diffusely compared to the prior study. No focal airspace consolidation, sizeable pleural effusion, or pneumothorax is identified. No acute osseous abnormality is seen. IMPRESSION: COPD with superimposed mild edema or infection not excluded. Electronically Signed   By: Logan Bores M.D.   On: 04/01/2022 08:31  EKG: Atrial flutter at a rate of 155 bpm.  Unclear if flutter waves are contributing to ST and T wave changes throughout.  Repeat EKG shows atrial fibrillation with a rate of 100.  Nonspecific T wave abnormalities.  Prolonged QTc.  ASSESSMENT AND PLAN:  Demaya Hardge is a 66 year old female with moderate pulmonary hypertension, COPD on 2.5 L, ongoing tobacco use, OSA on BiPAP for hypercapnia, chronic pain syndrome who presents with fatigue and shortness of breath.  She was discovered to be in atrial flutter with a heart rate of 150 prompting cardiology consultation.  # New onset atrial  fibrillation/flutter Presented with fatigue and shortness of breath discovered to be in atrial flutter with a rate of 150.  She converted to atrial fibrillation that has been rate controlled with diltiazem.  -CHA2DS2-VASc score = 3 (CHF, HTN, VASC) - continue Xarelto for stroke risk redcution -s/p IV diltiazem  and s/p diltiazem 120 mg daily , amiodarone gtt started 7/9, stopped at 3am due to hypotension and is not on any rate or rhythm control - went back into atrial flutter with rates 130-140 and felt anxious - restarted amiodarone drip on 7/10 - can change to PO dosing today - continue cardizem 87m PO q6h, consolidate to 639mBID tomorrow - s/p digoxin 0.5 x 2 doses, holding further due to supratheraputic levels - plan for possible TEE and DCCV at some point this week if her respiratory status continues to improve or on an outpatient basis after 3 weeks of uninterrupted AC  #Nonobstructive CAD Underwent heart catheterization in 2020 for evaluation of HFpEF and shortness of breath.  During this time was discovered to have a 50% mid LAD lesion which was felt to be nonsignificant. -Okay to hold aspirin in the setting of Xarelto use -Continue simvastatin 20 mg daily - LDL goal < 70  #HFpEF #Moderate pulmonary hypertension She does have some issues with volume overload chronically and takes spironolactone and torsemide for this.  This is all likely related to COPD and tobacco use as well as pulmonary hypertension. -ok to hold home spironolactone -Continue home torsemide    Signed:   BrSerafina RoyalsD FASt. Rose [name]

## 2022-04-29 ENCOUNTER — Encounter
Admission: EM | Disposition: A | Discharge status: Home Health | Payer: Self-pay | Source: Home / Self Care | Attending: Internal Medicine

## 2022-04-29 ENCOUNTER — Inpatient Hospital Stay: Payer: Medicare HMO | Admitting: Anesthesiology

## 2022-04-29 ENCOUNTER — Inpatient Hospital Stay
Admit: 2022-04-29 | Discharge: 2022-04-29 | Disposition: A | Discharge status: Home / Self Care | Payer: Medicare HMO | Attending: Internal Medicine | Admitting: Internal Medicine

## 2022-04-29 ENCOUNTER — Encounter: Payer: Self-pay | Admitting: Internal Medicine

## 2022-04-29 DIAGNOSIS — J441 Chronic obstructive pulmonary disease with (acute) exacerbation: Secondary | ICD-10-CM | POA: Diagnosis not present

## 2022-04-29 DIAGNOSIS — J9622 Acute and chronic respiratory failure with hypercapnia: Secondary | ICD-10-CM | POA: Diagnosis not present

## 2022-04-29 DIAGNOSIS — I4892 Unspecified atrial flutter: Secondary | ICD-10-CM | POA: Diagnosis not present

## 2022-04-29 DIAGNOSIS — J9621 Acute and chronic respiratory failure with hypoxia: Secondary | ICD-10-CM | POA: Diagnosis not present

## 2022-04-29 HISTORY — PX: TEE WITHOUT CARDIOVERSION: SHX5443

## 2022-04-29 HISTORY — PX: CARDIOVERSION: SHX1299

## 2022-04-29 LAB — PROTIME-INR
INR: 1.6 — ABNORMAL HIGH (ref 0.8–1.2)
Prothrombin Time: 18.4 seconds — ABNORMAL HIGH (ref 11.4–15.2)

## 2022-04-29 SURGERY — ECHOCARDIOGRAM, TRANSESOPHAGEAL
Anesthesia: General

## 2022-04-29 MED ORDER — PREDNISONE 10 MG PO TABS: ORAL_TABLET | ORAL | 0 refills | Status: AC

## 2022-04-29 MED ORDER — IPRATROPIUM-ALBUTEROL 0.5-2.5 (3) MG/3ML IN SOLN
3.0000 mL | Freq: Four times a day (QID) | RESPIRATORY_TRACT | Status: DC | PRN
Start: 2022-04-29 — End: 2022-04-29
  Administered 2022-04-29: 3 mL via RESPIRATORY_TRACT
  Filled 2022-04-29: qty 3

## 2022-04-29 MED ORDER — LIDOCAINE HCL (CARDIAC) PF 100 MG/5ML IV SOSY
PREFILLED_SYRINGE | INTRAVENOUS | Status: DC | PRN
  Administered 2022-04-29: 80 mg via INTRATRACHEAL

## 2022-04-29 MED ORDER — BUTAMBEN-TETRACAINE-BENZOCAINE 2-2-14 % EX AERO
INHALATION_SPRAY | CUTANEOUS | Status: AC
  Filled 2022-04-29: qty 5

## 2022-04-29 MED ORDER — AMIODARONE HCL 200 MG PO TABS: 200.0000 mg | ORAL_TABLET | Freq: Every day | ORAL | 0 refills | Status: AC

## 2022-04-29 MED ORDER — SODIUM CHLORIDE 0.9 % IV SOLN: INTRAVENOUS | Status: DC

## 2022-04-29 MED ORDER — SODIUM CHLORIDE FLUSH 0.9 % IV SOLN
INTRAVENOUS | Status: AC
  Administered 2022-04-29: 10 mL
  Filled 2022-04-29: qty 10

## 2022-04-29 MED ORDER — DILTIAZEM HCL 60 MG PO TABS: 60.0000 mg | ORAL_TABLET | Freq: Two times a day (BID) | ORAL | 0 refills | Status: DC

## 2022-04-29 MED ORDER — LIDOCAINE VISCOUS HCL 2 % MT SOLN
OROMUCOSAL | Status: AC
  Filled 2022-04-29: qty 15

## 2022-04-29 MED ORDER — RIVAROXABAN 20 MG PO TABS: 20.0000 mg | ORAL_TABLET | Freq: Every day | ORAL | 0 refills | Status: DC

## 2022-04-29 MED ORDER — FENTANYL CITRATE (PF) 100 MCG/2ML IJ SOLN
INTRAMUSCULAR | Status: AC
  Filled 2022-04-29: qty 2

## 2022-04-29 MED ORDER — PROPOFOL 10 MG/ML IV BOLUS
INTRAVENOUS | Status: DC | PRN
  Administered 2022-04-29: 50 mg via INTRAVENOUS
  Administered 2022-04-29: 20 mg via INTRAVENOUS
  Administered 2022-04-29: 30 mg via INTRAVENOUS
  Administered 2022-04-29: 20 mg via INTRAVENOUS
  Administered 2022-04-29: 10 mg via INTRAVENOUS
  Administered 2022-04-29: 20 mg via INTRAVENOUS

## 2022-04-29 NOTE — Transfer of Care (Signed)
Immediate Anesthesia Transfer of Care Note  Patient: Theresa [name]  Procedure(s) Performed: TRANSESOPHAGEAL ECHOCARDIOGRAM (TEE) CARDIOVERSION  Patient Location: Cath Lab  Anesthesia Type:General  Level of Consciousness: drowsy and patient cooperative  Airway & Oxygen Therapy: Patient Spontanous Breathing and Patient connected to face mask oxygen  Post-op Assessment: Report given to RN and Post -op Vital signs reviewed and stable  Post vital signs: Reviewed and stable  Last Vitals:  Vitals Value Taken Time  BP 124/77 04/29/22 1025  Temp    Pulse 71 04/29/22 1028  Resp 19 04/29/22 1028  SpO2 93 % 04/29/22 1028  Vitals shown include unvalidated device data.  Last Pain:  Vitals:   04/29/22 0953  TempSrc: Oral  PainSc: 6       Patients Stated Pain Goal: 0 (91/50/56 [id number])  Complications: No notable events documented.

## 2022-04-29 NOTE — Anesthesia Preprocedure Evaluation (Addendum)
Anesthesia Evaluation  Patient identified by MRN, date of birth, ID band Patient awake    Reviewed: Allergy & Precautions, NPO status , Patient's Chart, lab work & pertinent test results  Airway Mallampati: III  TM Distance: >3 FB Neck ROM: full    Dental  (+) Poor Dentition   Pulmonary shortness of breath, sleep apnea (BiPAP for hypercapnia) , COPD,  COPD inhaler and oxygen dependent, Current Smoker,  Recent COPD exacerbation    + wheezing      Cardiovascular hypertension, pulmonary hypertension+ CAD and +CHF (HFpEF)  + dysrhythmias Atrial Fibrillation  Rhythm:Irregular Rate:Normal  ECHO 6/23: 1. Left ventricular ejection fraction, by estimation, is 55 to 60%. The  left ventricle has normal function. The left ventricle has no regional  wall motion abnormalities. Left ventricular diastolic parameters are  consistent with Grade I diastolic  dysfunction (impaired relaxation).  2. Right ventricular systolic function is low normal. The right  ventricular size is mildly enlarged.  3. Left atrial size was mildly dilated.  4. Right atrial size was mildly dilated.  5. The mitral valve is normal in structure. Trivial mitral valve  regurgitation.  6. The aortic valve is grossly normal. Aortic valve regurgitation is not  visualized.   Conclusion(s)/Recommendation(s): Poor windows for evaluation of left  ventricular function by transthoracic echocardiography. Would recommend an  alternative means of evaluation.    EKG: Atrial flutter at a rate of 155 bpm.  Unclear if flutter waves are contributing to ST and T wave changes throughout.  Repeat EKG shows atrial fibrillation with a rate of 100.  Nonspecific T wave abnormalities.  Prolonged QTc.   Nonobstructive CAD Underwent heart catheterization in 2020 for evaluation of HFpEF and shortness of breath.  During this time was discovered to have a 50% mid LAD lesion which was felt to  be nonsignificant.   Neuro/Psych PSYCHIATRIC DISORDERS Anxiety Depression Chronic pain syndrome    GI/Hepatic Neg liver ROS, GERD  ,  Endo/Other  negative endocrine ROS  Renal/GU negative Renal ROS  negative genitourinary   Musculoskeletal  (+) Arthritis ,   Abdominal (+) + obese,   Peds  Hematology negative hematology ROS (+)   Anesthesia Other Findings Past Medical History: 10/03/2017: Acute postoperative pain No date: Anxiety No date: BiPAP (biphasic positive airway pressure) dependence     Comment:  at hs No date: Carpal tunnel syndrome No date: CHF (congestive heart failure) (HCC)     Comment:  1/18 No date: CHF (congestive heart failure) (HCC) No date: Chronic generalized abdominal pain No date: Chronic rhinitis No date: COPD (chronic obstructive pulmonary disease) (HCC) No date: DDD (degenerative disc disease), cervical No date: DDD (degenerative disc disease), lumbosacral No date: Depression No date: Dyspnea No date: Edema No date: Flu     Comment:  1/18 No date: Gastritis No date: GERD (gastroesophageal reflux disease) No date: Hematuria 07/17/2018: Hernia of abdominal wall No date: Hernia, abdominal No date: Hypertension No date: Kidney stones No date: Low back pain No date: Lumbar radiculitis No date: Malodorous urine No date: Muscle weakness No date: Obesity No date: Oxygen dependent 02/03/2020: Pneumonia due to COVID-19 virus No date: Renal cyst No date: Sensory urge incontinence No date: Thyroid activity decreased     Comment:  9/19 No date: Tobacco abuse No date: Wheezing  Past Surgical History: No date: ABDOMINAL HYSTERECTOMY 2012: CARPAL TUNNEL RELEASE; Left 08/13/2018: EPIGASTRIC HERNIA REPAIR; N/A     Comment:  HERNIA REPAIR EPIGASTRIC ADULT; polypropylene mesh  reinforcement.  Surgeon: Robert Bellow, MD;                Location: [facility];  Service: General;  Laterality: N/A; 07/11/2019: RIGHT/LEFT HEART CATH AND  CORONARY ANGIOGRAPHY; N/A     Comment:  Procedure: RIGHT/LEFT HEART CATH AND CORONARY               ANGIOGRAPHY;  Surgeon: Yolonda Kida, MD;  Location:              [place] [facility];  Service: Cardiovascular;                Laterality: N/A; No date: TONSILLECTOMY No date: TUBAL LIGATION 23/55/7322: UMBILICAL HERNIA REPAIR; N/A     Comment:  HERNIA REPAIR UMBILICAL ADULT; polypropylene mesh               reinforcement Surgeon: Robert Bellow, MD;  Location:              [facility];  Service: General;  Laterality: N/A;  BMI    Body Mass Index: 35.20 kg/m      Reproductive/Obstetrics negative OB ROS                            Anesthesia Physical Anesthesia Plan  ASA: 4  Anesthesia Plan: General   Post-op Pain Management:    Induction: Intravenous  PONV Risk Score and Plan: Propofol infusion and TIVA  Airway Management Planned: Natural Airway and Simple Face Mask  Additional Equipment:   Intra-op Plan:   Post-operative Plan:   Informed Consent: I have reviewed the patients History and Physical, chart, labs and discussed the procedure including the risks, benefits and alternatives for the proposed anesthesia with the patient or authorized representative who has indicated his/her understanding and acceptance.     Dental Advisory Given  Plan Discussed with: Anesthesiologist, CRNA and Surgeon  Anesthesia Plan Comments:       Anesthesia Quick Evaluation

## 2022-04-29 NOTE — Anesthesia Procedure Notes (Signed)
Procedure Name: General with mask airway Date/Time: 04/29/2022 10:17 AM  Performed by: Kelton Pillar, CRNAPre-anesthesia Checklist: Patient identified, Emergency Drugs available, Suction available and Patient being monitored Patient Re-evaluated:Patient Re-evaluated prior to induction Oxygen Delivery Method: Simple face mask Induction Type: IV induction Placement Confirmation: positive ETCO2, CO2 detector and breath sounds checked- equal and bilateral Dental Injury: Teeth and Oropharynx as per pre-operative assessment

## 2022-04-29 NOTE — Discharge Summary (Signed)
Physician Discharge Summary   Patient: Theresa [name] MRN: [id number] DOB: 06/13/1956  Admit date:     04/23/2022  Discharge date: 04/29/22  Discharge Physician: Sharen Hones   PCP: Birdie Sons, MD   Recommendations at discharge:   Follow-up with PCP in 1 week Follow-up with cardiology in 2 weeks  Discharge Diagnoses: Principal Problem:   Atrial flutter with rapid ventricular response (HCC) Active Problems:   Acute on chronic respiratory failure with hypoxia and hypercapnia (HCC)   COPD with acute exacerbation (HCC)   Chronic diastolic CHF (congestive heart failure) (HCC)   Pulmonary arterial hypertension (HCC)   Hypothyroidism, unspecified   Depression   Obesity (BMI 30-39.9)   COPD exacerbation (HCC)   Hyperlipidemia, unspecified  Resolved Problems:   * No resolved hospital problems. *  Hospital Course: 66 year old female past medical history of diastolic congestive heart failure, hypertension chronic respiratory failure on 2.5 L of oxygen, COPD, GERD, hypothyroidism, wears BiPAP at night.  She presented with shortness of breath and fatigue.  She was found to be in atrial flutter with rapid ventricular response.  Initially tried on Cardizem drip but blood pressure continued to go low and switched over to amiodarone drip.  She had worsening respiratory status on 04/24/2022 requiring high flow nasal cannula and transferred to the stepdown unit.  I started Solu-Medrol on 04/24/2022 for COPD exacerbation.  Patient's respiratory status gradually improving.  Cardiology planning on a TEE cardioversion once stable.  On Xarelto for anticoagulation.  Assessment and Plan: New onset atrial flutter with rapid ventricular response. Chronic diastolic congestive heart failure. Pulmonary artery hypertension. Patient was cardioverted today to sinus rhythm.  Condition had improved.  We will continue anticoagulation with Xarelto.  Also added twice a day of diltiazem.  Patient be followed by  cardiology in 2 weeks.   Acute on chronic respiratory failure with hypoxemia and hypercapnia. COPD exacerbation. Obstructive sleep apnea. Patient condition has improved, can wean down to 2.5 L oxygen at time of discharge.  Patient was wearing BiPAP at home, will continue that.  Continue steroid taper, follow-up with PCP in 1 week.   Depression and anxiety. Resume home treatment         Consultants: Cardiology Procedures performed: Cardioversion Disposition: Home health Diet recommendation:  Discharge Diet Orders (From admission, onward)     Start     Ordered   04/29/22 0000  Diet - low sodium heart healthy        04/29/22 1309           Cardiac diet DISCHARGE MEDICATION: Allergies as of 04/29/2022       Reactions   Gabapentin Other (See Comments)   Higher doses of 800 mg, 600 mg, 300 mg causes excessive sedation and contributes to numerous mechanical falls. (Unsure if 100 mg dose can be tolerated)   Codeine Nausea And Vomiting   Meloxicam Other (See Comments)   Stomach pain        Medication List     STOP taking these medications    spironolactone 50 MG tablet Commonly known as: ALDACTONE       TAKE these medications    acetaminophen 500 MG tablet Commonly known as: TYLENOL Take 1,000 mg by mouth every 8 (eight) hours as needed.   albuterol 108 (90 Base) MCG/ACT inhaler Commonly known as: VENTOLIN HFA Inhale 2 puffs into the lungs every 6 (six) hours as needed for wheezing or shortness of breath.   ambrisentan 5 MG tablet Commonly known as: LETAIRIS  Take 5 mg by mouth daily.   amiodarone 200 MG tablet Commonly known as: PACERONE Take 1 tablet (200 mg total) by mouth daily. Start taking on: April 30, 2022   aspirin EC 81 MG tablet Take 81 mg by mouth daily.   Besivance 0.6 % Susp Generic drug: Besifloxacin HCl Place 1 drop into the right eye 3 (three) times daily. For 2 weeks starting 03/29/22   butalbital-acetaminophen-caffeine 50-325-40  MG tablet Commonly known as: FIORICET Take 1 tablet by mouth every 6 (six) hours as needed for headache. LIMIT USE TO PREVENT MEDICATION OVERUSE HEADACHE   Cholecalciferol 50 MCG (2000 UT) Caps Take 2,000 Units by mouth daily.   dextromethorphan-guaiFENesin 30-600 MG 12hr tablet Commonly known as: MUCINEX DM Take 1 tablet by mouth 2 (two) times daily as needed for cough.   Difluprednate 0.05 % Emul Place 1 drop into the right eye in the morning, at noon, in the evening, and at bedtime. One drop 4 times daily for one week starting 03/29/22. One drop 3 times daily for one week. One drop 2 times daily for one week. One drop once daily for one week, then stop.   diltiazem 60 MG tablet Commonly known as: CARDIZEM Take 1 tablet (60 mg total) by mouth every 12 (twelve) hours.   Fluticasone-Umeclidin-Vilant 100-62.5-25 MCG/INH Aepb Inhale 1 puff into the lungs daily.   HYDROcodone-acetaminophen 5-325 MG tablet Commonly known as: NORCO/VICODIN Take 1 tablet by mouth every 8 (eight) hours as needed for severe pain. Must last 30 days Start taking on: June 30, 2022 What changed: Another medication with the same name was removed. Continue taking this medication, and follow the directions you see here.   ipratropium-albuterol 0.5-2.5 (3) MG/3ML Soln Commonly known as: DUONEB USE 1 VIAL VIA NEBULIZER EVERY 6 HOURS AS NEEDED FOR SHORTNESS OF BREATH OR WHEEZING   levothyroxine 25 MCG tablet Commonly known as: SYNTHROID TAKE 1 TABLET(25 MCG) BY MOUTH DAILY BEFORE BREAKFAST   nicotine 21 mg/24hr patch Commonly known as: NICODERM CQ - dosed in mg/24 hours Place 1 patch (21 mg total) onto the skin daily.   OXYGEN Inhale 2.5 L/min into the lungs.   potassium chloride SA 20 MEQ tablet Commonly known as: KLOR-CON M Take 20-40 mEq by mouth 2 (two) times daily. Take 40 mEq in the morning and 20 mEq in the evening.   predniSONE 10 MG tablet Commonly known as: DELTASONE Take 4 tablets (40 mg  total) by mouth daily for 3 days, THEN 2 tablets (20 mg total) daily for 3 days, THEN 1 tablet (10 mg total) daily for 3 days. Start taking on: April 29, 2022   Prolensa 0.07 % Soln Generic drug: Bromfenac Sodium Place 1 drop into the right eye at bedtime. For 4 weeks starting 03/29/22   rivaroxaban 20 MG Tabs tablet Commonly known as: XARELTO Take 1 tablet (20 mg total) by mouth daily with supper.   roflumilast 500 MCG Tabs tablet Commonly known as: DALIRESP Take 500 mcg by mouth daily.   sertraline 100 MG tablet Commonly known as: ZOLOFT TAKE 2 TABLETS BY MOUTH ONCE EVERY MORNING   simvastatin 20 MG tablet Commonly known as: ZOCOR Take 20 mg by mouth at bedtime.   torsemide 20 MG tablet Commonly known as: DEMADEX Take 2 tablets (40 mg total) by mouth 2 (two) times daily. Take 4 tablets QAM and 2 tablets QPM What changed: how much to take        Follow-up Information  Birdie Sons, MD Follow up in 1 week(s).   Specialty: Family Medicine Contact information: [address] [place] [address] [phone]         Corey Skains, MD Follow up in 2 week(s).   Specialty: Cardiology Contact information: [address] [facility] [place] [address] [phone]                Discharge Exam: Danley Danker Weights   04/23/22 1612 04/24/22 1528  Weight: 90.6 kg 87.3 kg   General exam: Appears calm and comfortable  Respiratory system: Clear to auscultation. Respiratory effort normal. Cardiovascular system: S1 & S2 heard, RRR. No JVD, murmurs, rubs, gallops or clicks. No pedal edema. Gastrointestinal system: Abdomen is nondistended, soft and nontender. No organomegaly or masses felt. Normal bowel sounds heard. Central nervous system: Alert and oriented. No focal neurological deficits. Extremities: Symmetric 5 x 5 power. Skin: No rashes, lesions or ulcers Psychiatry: Judgement and insight appear normal. Mood &  affect appropriate.    Condition at discharge: good  The results of significant diagnostics from this hospitalization (including imaging, microbiology, ancillary and laboratory) are listed below for reference.   Imaging Studies: ECHO TEE  Result Date: 04/29/2022    TRANSESOPHOGEAL ECHO REPORT   Patient Name:   [facility] Date of Exam: 04/29/2022 Medical Rec #:  [id number]      Height:       62.0 in Accession #:    [id number]     Weight:       192.5 lb Date of Birth:  02/18/1956       BSA:          1.881 m Patient Age:    78 years       BP:           115/69 mmHg Patient Gender: F              HR:           52 bpm. Exam Location:  [facility] Procedure: 2D Echo, Cardiac Doppler and Color Doppler Indications:     Not listed on TEE--check-in sheet  History:         Patient has prior history of Echocardiogram examinations, most                  recent 04/02/2022. CHF, Signs/Symptoms:Dyspnea; Risk                  Factors:Hypertension. Tobacco abuse.  Sonographer:     Sherrie Sport Referring Phys:  [place] Diagnosing Phys: [name] MD PROCEDURE: The transesophogeal probe was passed without difficulty through the esophogus of the patient. Sedation performed by performing physician. The patient developed no complications during the procedure. A successful direct current cardioversion was performed at 120 joules with 1 attempt. IMPRESSIONS  1. Left ventricular ejection fraction, by estimation, is 60 to 65%. The left ventricle has normal function.  2. Right ventricular systolic function is normal. The right ventricular size is normal.  3. No left atrial/left atrial appendage thrombus was detected.  4. The mitral valve is normal in structure. Mild mitral valve regurgitation.  5. The aortic valve is normal in structure. Aortic valve regurgitation is trivial.  6. There is mild (Grade II) plaque.  7. Agitated saline contrast bubble study was negative, with no evidence of any interatrial shunt. FINDINGS  Left  Ventricle: Left ventricular ejection fraction, by estimation, is 60 to 65%. The left ventricle has  normal function. The left ventricular internal cavity size was normal in size. Right Ventricle: The right ventricular size is normal. No increase in right ventricular wall thickness. Right ventricular systolic function is normal. Left Atrium: Left atrial size was normal in size. No left atrial/left atrial appendage thrombus was detected. Right Atrium: Right atrial size was normal in size. Pericardium: There is no evidence of pericardial effusion. Mitral Valve: The mitral valve is normal in structure. Mild mitral valve regurgitation. Tricuspid Valve: The tricuspid valve is normal in structure. Tricuspid valve regurgitation is mild. Aortic Valve: The aortic valve is normal in structure. Aortic valve regurgitation is trivial. Pulmonic Valve: The pulmonic valve was normal in structure. Pulmonic valve regurgitation is trivial. Aorta: The aortic root and ascending aorta are structurally normal, with no evidence of dilitation. There is mild (Grade II) plaque. IAS/Shunts: No atrial level shunt detected by color flow Doppler. Agitated saline contrast was given intravenously to evaluate for intracardiac shunting. Agitated saline contrast bubble study was negative, with no evidence of any interatrial shunt. There  is no evidence of a patent foramen ovale. There is no evidence of an atrial septal defect. [name] MD Electronically signed by [name] MD Signature Date/Time: 04/29/2022/12:55:30 PM    Final    DG Chest Port 1 View  Result Date: 04/26/2022 CLINICAL DATA:  Wheezing, on CPAP, history COPD, CHF, hypertension, smoker EXAM: PORTABLE CHEST 1 VIEW COMPARISON:  Portable exam 0558 hours compared to 04/24/2022 FINDINGS: Enlargement of cardiac silhouette with pulmonary vascular congestion. Atherosclerotic calcification aorta. Linear subsegmental atelectasis at lower lungs bilaterally. No acute infiltrate, pleural  effusion, or pneumothorax. IMPRESSION: Enlargement of cardiac silhouette with pulmonary vascular congestion. Bibasilar atelectasis. Electronically Signed   By: Lavonia Dana M.D.   On: 04/26/2022 08:13   DG Chest Port 1 View  Result Date: 04/24/2022 CLINICAL DATA:  Shortness of breath. EXAM: PORTABLE CHEST 1 VIEW COMPARISON:  Chest x-ray 04/23/2022. FINDINGS: Similar mild diffuse interstitial prominence. No consolidation. No visible pleural effusions or pneumothorax. Similar cardiomegaly and pulmonary vascular congestion. IMPRESSION: 1. Similar mild diffuse interstitial prominence, which could represent mild interstitial edema versus the sequela of recurrent bouts of CHF. 2. Similar cardiomegaly and pulmonary vascular congestion. Electronically Signed   By: Margaretha Sheffield M.D.   On: 04/24/2022 14:07   DG Chest 2 View  Result Date: 04/23/2022 CLINICAL DATA:  Chest pain EXAM: CHEST - 2 VIEW COMPARISON:  04/05/2022 FINDINGS: Cardiomegaly. Both lungs are clear. Emphysema. Disc degenerative disease of the thoracic spine. IMPRESSION: 1.  Cardiomegaly without acute abnormality of the lungs. 2.  Emphysema. Electronically Signed   By: Delanna Ahmadi M.D.   On: 04/23/2022 16:43   CT Angio Chest Pulmonary Embolism (PE) W or WO Contrast  Result Date: 04/09/2022 CLINICAL DATA:  PE suspected, tachycardia EXAM: CT ANGIOGRAPHY CHEST WITH CONTRAST TECHNIQUE: Multidetector CT imaging of the chest was performed using the standard protocol during bolus administration of intravenous contrast. Multiplanar CT image reconstructions and MIPs were obtained to evaluate the vascular anatomy. RADIATION DOSE REDUCTION: This exam was performed according to the departmental dose-optimization program which includes automated exposure control, adjustment of the mA and/or kV according to patient size and/or use of iterative reconstruction technique. CONTRAST:  77m OMNIPAQUE IOHEXOL 350 MG/ML SOLN COMPARISON:  None Available. FINDINGS:  Cardiovascular: Examination for pulmonary embolism is limited by breath motion artifact within this limitation, no evidence of pulmonary embolism through the proximal segmental pulmonary arterial level no evidence of pulmonary embolism. Mild cardiomegaly three-vessel coronary artery  calcifications. No pericardial effusion. Aortic atherosclerosis. Mediastinum/Nodes: No enlarged mediastinal, hilar, or axillary lymph nodes. Thyroid gland, trachea, and esophagus demonstrate no significant findings. Lungs/Pleura: Moderate emphysema. Near-total atelectasis or consolidation of the right middle lobe (series 6, image 57). Bandlike scarring or atelectasis of the bilateral lung bases. No pleural effusion or pneumothorax. Upper Abdomen: No acute abnormality. Multiple low-attenuation lesions throughout the liver, incompletely characterized although likely benign simple cysts. Musculoskeletal: No chest wall abnormality. No acute osseous findings. Review of the MIP images confirms the above findings. IMPRESSION: 1. Examination for pulmonary embolism is limited by breath motion artifact. Within this limitation, no evidence of pulmonary embolism through the proximal segmental pulmonary arterial level. 2. Near-total atelectasis or consolidation of the right middle lobe, of uncertain chronicity. Consider bronchoscopic evaluation. 3. Additional bandlike scarring or atelectasis of the bilateral lung bases. 4. Moderate emphysema. 5. Coronary artery disease. Aortic Atherosclerosis (ICD10-I70.0) and Emphysema (ICD10-J43.9). Electronically Signed   By: Delanna Ahmadi M.D.   On: 04/09/2022 15:13   MR BRAIN WO CONTRAST  Result Date: 04/08/2022 CLINICAL DATA:  Initial evaluation for neuro deficit, stroke suspected. EXAM: MRI HEAD WITHOUT CONTRAST TECHNIQUE: Multiplanar, multiecho pulse sequences of the brain and surrounding structures were obtained without intravenous contrast. COMPARISON:  Prior study from earlier the same day. FINDINGS:  Brain: Cerebral volume within normal limits. Patchy and hazy T2/FLAIR hyperintensity involving the periventricular it deep white matter both cerebral hemispheres as well as the pons, most consistent with chronic small vessel ischemic disease, moderately advanced in nature. Prominent involvement of the bilateral basal ganglia and thalami noted as well. No evidence for acute or subacute infarct. Gray-white matter differentiation maintained. No areas of chronic cortical infarction. No acute or chronic intracranial blood products. No mass lesion, midline shift or mass effect. No hydrocephalus or extra-axial fluid collection. Pituitary gland suprasellar region within normal limits. Vascular: Major intracranial vascular flow voids are maintained. Skull and upper cervical spine: Mild cerebellar tonsillar ectopia without frank Chiari malformation. Bone marrow signal intensity within normal limits. No scalp soft tissue abnormality. Sinuses/Orbits: Prior bilateral ocular lens replacement. Paranasal sinuses are largely clear. No significant mastoid effusion. Other: None. IMPRESSION: 1. No acute intracranial abnormality. 2. Moderately advanced chronic microvascular ischemic disease for age. Electronically Signed   By: Jeannine Boga M.D.   On: 04/08/2022 23:53   CT ANGIO HEAD NECK W WO CM  Result Date: 04/08/2022 CLINICAL DATA:  Acute neuro deficit. EXAM: CT ANGIOGRAPHY HEAD AND NECK TECHNIQUE: Multidetector CT imaging of the head and neck was performed using the standard protocol during bolus administration of intravenous contrast. Multiplanar CT image reconstructions and MIPs were obtained to evaluate the vascular anatomy. Carotid stenosis measurements (when applicable) are obtained utilizing NASCET criteria, using the distal internal carotid diameter as the denominator. RADIATION DOSE REDUCTION: This exam was performed according to the departmental dose-optimization program which includes automated exposure  control, adjustment of the mA and/or kV according to patient size and/or use of iterative reconstruction technique. CONTRAST:  53m OMNIPAQUE IOHEXOL 350 MG/ML SOLN COMPARISON:  CT head 04/01/2022 FINDINGS: CT HEAD FINDINGS Brain: Ventricle size normal. Hypodensity in the frontal white matter bilaterally unchanged. Negative for acute infarct, acute hemorrhage, or mass. Vascular: Negative for hyperdense vessel Skull: Negative Sinuses: Paranasal sinuses clear. Orbits: Bilateral cataract extraction. Review of the MIP images confirms the above findings CTA NECK FINDINGS Aortic arch: Standard branching. Imaged portion shows no evidence of aneurysm or dissection. No significant stenosis of the major arch vessel origins. Mild atherosclerotic disease aortic  arch and proximal great vessels Right carotid system: Mild atherosclerotic disease at the right carotid bifurcation without stenosis. Left carotid system: Mild atherosclerotic calcification left carotid bifurcation without stenosis Vertebral arteries: Both vertebral arteries are widely patent to the skull base without stenosis Skeleton: Mild cervical spondylosis. No acute skeletal abnormality. Periapical lucency around right upper molar. Other neck: Negative for mass or adenopathy Upper chest: Mild apical emphysema.  No acute abnormality Review of the MIP images confirms the above findings CTA HEAD FINDINGS Anterior circulation: Atherosclerotic calcification and mild stenosis in the cavernous carotid bilaterally. Anterior and middle cerebral arteries widely patent without stenosis or aneurysm. Posterior circulation: Both vertebral arteries patent to the basilar. PICA patent bilaterally. Basilar widely patent. AICA, superior cerebellar, posterior cerebral arteries patent. No stenosis or aneurysm. Fetal origin posterior cerebral artery bilaterally. Venous sinuses: Normal venous enhancement Anatomic variants: None Review of the MIP images confirms the above findings  IMPRESSION: 1. CT head negative for acute abnormality. Chronic white matter changes bilaterally due to chronic ischemia. 2. Mild atherosclerotic disease carotid bifurcation bilaterally. No carotid or vertebral artery stenosis in the neck. Mild atherosclerotic stenosis in the cavernous carotid bilaterally. 3. No intracranial large vessel occlusion or flow limiting stenosis Electronically Signed   By: Franchot Gallo M.D.   On: 04/08/2022 11:28   MR CERVICAL SPINE WO CONTRAST  Result Date: 04/07/2022 CLINICAL DATA:  Cervical myelopathy EXAM: MRI CERVICAL SPINE WITHOUT CONTRAST TECHNIQUE: Multiplanar, multisequence MR imaging of the cervical spine was performed. No intravenous contrast was administered. COMPARISON:  12/25/2007 MRI cervical spine 10/11/2019 CT cervical spine FINDINGS: Evaluation is somewhat limited by motion artifact. Alignment: Straightening and mild reversal of the normal cervical lordosis. No listhesis. Vertebrae: No acute fracture or suspicious osseous lesion. T1 and T2 hyperintense focus in the C5 vertebral body, most likely a benign hemangioma. Vertebral body heights are preserved. Congenitally short pedicles, which narrow the AP diameter of the spinal canal. Cord: Normal signal and morphology. Previously noted prominence of the central canal at the level of C7 is no longer seen. Posterior Fossa, vertebral arteries, paraspinal tissues: Negative. Disc levels: C2-C3: No significant disc bulge. No spinal canal stenosis or neural foraminal narrowing. C3-C4: Minimal disc bulge. Facet and uncovertebral hypertrophy, left-greater-than-right. No spinal canal stenosis. Mild-to-moderate right and severe left neural foraminal narrowing, which have progressed from the prior MRI . C4-C5: No significant disc bulge. Facet and uncovertebral hypertrophy. Moderate left and mild right neural foraminal narrowing, which have progressed from the prior exam. C5-C6: Minimal disc bulge. Ligamentum flavum hypertrophy.  Facet and uncovertebral hypertrophy. Mild spinal canal stenosis, with severe right and moderate to severe left neural foraminal narrowing, which have progressed from the prior exam. C6-C7: Disc height loss and minimal disc bulge. Facet and uncovertebral hypertrophy. Ligamentum flavum hypertrophy. Mild spinal canal stenosis overall unchanged. Mild-to-moderate right and severe left neural foraminal narrowing, which have progressed from prior exam. C7-T1: No significant disc bulge. No spinal canal stenosis or neural foraminal narrowing. IMPRESSION: 1. Evaluation is limited by motion artifact. Within this limitation, there progressive degenerative changes compared to 2009, with mild spinal canal stenosis C4-C5 and C5-C6. 2. Multilevel facet and uncovertebral hypertrophy, which causes up to severe neural foraminal narrowing, which is seen on the left at C3-C4 and C6-C7 and on the right at C5-C6. Additional less significant neural foraminal narrowing, as described above. Electronically Signed   By: Merilyn Baba M.D.   On: 04/07/2022 00:59   DG Chest Port 1 View  Result Date: 04/05/2022 CLINICAL  DATA:  Shortness of breath, hypoxia EXAM: PORTABLE CHEST 1 VIEW COMPARISON:  04/01/2022 FINDINGS: No significant change in AP portable examination. Cardiomegaly with mild, diffuse interstitial opacity and pulmonary vascular prominence. The visualized skeletal structures are unremarkable. IMPRESSION: No significant change in AP portable examination. Cardiomegaly with mild, diffuse interstitial opacity and pulmonary vascular prominence, findings most likely reflect edema. No new or focal airspace opacity. Electronically Signed   By: Delanna Ahmadi M.D.   On: 04/05/2022 09:32   MR BRAIN WO CONTRAST  Result Date: 04/04/2022 CLINICAL DATA:  Occipital region headache and scalp tenderness. EXAM: MRI HEAD WITHOUT CONTRAST TECHNIQUE: Multiplanar, multiecho pulse sequences of the brain and surrounding structures were obtained without  intravenous contrast. COMPARISON:  Head CT 04/01/2022. Head MRI 10/11/2019 and 12/10/2017. FINDINGS: Brain: There is no evidence of an acute infarct, intracranial hemorrhage, mass, midline shift, or extra-axial fluid collection. Patchy T2 hyperintensities in the cerebral white matter, basal ganglia, thalami, and brainstem are stable to mildly increased compared to the prior MRI and are nonspecific but compatible with moderate chronic small vessel ischemic disease. The ventricles and sulci are within normal limits for age. Vascular: Major intracranial vascular flow voids are preserved. Skull and upper cervical spine: Diffuse bone marrow heterogeneity including increased trace diffusion weighted signal intensity, chronic based on the 2019 MRI and nonspecific. No discrete focal lesion. Sinuses/Orbits: Bilateral cataract extraction. Paranasal sinuses and mastoid air cells are clear. Other: None. IMPRESSION: 1. No acute intracranial abnormality. 2. Moderate chronic small vessel ischemic disease. Electronically Signed   By: Logan Bores M.D.   On: 04/04/2022 14:30   ECHOCARDIOGRAM COMPLETE  Result Date: 04/02/2022    ECHOCARDIOGRAM REPORT   Patient Name:   [facility] Date of Exam: 04/02/2022 Medical Rec #:  [id number]      Height:       62.0 in Accession #:    [id number]     Weight:       202.6 lb Date of Birth:  05-18-1956       BSA:          1.922 m Patient Age:    55 years       BP:           101/58 mmHg Patient Gender: F              HR:           93 bpm. Exam Location:  [facility] Procedure: 2D Echo Indications:     CHF I50.31  History:         Patient has prior history of Echocardiogram examinations, most                  recent 08/08/2021.  Sonographer:     Kathlen Brunswick RDCS Referring Phys:  [id number] Soledad Gerlach NIU Diagnosing Phys: Yolonda Kida MD IMPRESSIONS  1. Left ventricular ejection fraction, by estimation, is 55 to 60%. The left ventricle has normal function. The left ventricle has no regional wall motion  abnormalities. Left ventricular diastolic parameters are consistent with Grade I diastolic dysfunction (impaired relaxation).  2. Right ventricular systolic function is low normal. The right ventricular size is mildly enlarged.  3. Left atrial size was mildly dilated.  4. Right atrial size was mildly dilated.  5. The mitral valve is normal in structure. Trivial mitral valve regurgitation.  6. The aortic valve is grossly normal. Aortic valve regurgitation is not visualized. Conclusion(s)/Recommendation(s): Poor windows for evaluation of left ventricular function by transthoracic echocardiography. Would  recommend an alternative means of evaluation. FINDINGS  Left Ventricle: Left ventricular ejection fraction, by estimation, is 55 to 60%. The left ventricle has normal function. The left ventricle has no regional wall motion abnormalities. The left ventricular internal cavity size was normal in size. There is  no left ventricular hypertrophy. Left ventricular diastolic parameters are consistent with Grade I diastolic dysfunction (impaired relaxation). Right Ventricle: The right ventricular size is mildly enlarged. No increase in right ventricular wall thickness. Right ventricular systolic function is low normal. Left Atrium: Left atrial size was mildly dilated. Right Atrium: Right atrial size was mildly dilated. Pericardium: There is no evidence of pericardial effusion. Mitral Valve: The mitral valve is normal in structure. Trivial mitral valve regurgitation. Tricuspid Valve: The tricuspid valve is normal in structure. Tricuspid valve regurgitation is trivial. Aortic Valve: The aortic valve is grossly normal. Aortic valve regurgitation is not visualized. Aortic valve mean gradient measures 8.0 mmHg. Aortic valve peak gradient measures 15.1 mmHg. Aortic valve area, by VTI measures 2.58 cm. Pulmonic Valve: The pulmonic valve was normal in structure. Pulmonic valve regurgitation is not visualized. Aorta: The ascending  aorta was not well visualized. IAS/Shunts: No atrial level shunt detected by color flow Doppler.  LEFT VENTRICLE PLAX 2D LVIDd:         4.55 cm      Diastology LVIDs:         3.12 cm      LV e' medial:    9.90 cm/s LV PW:         1.10 cm      LV E/e' medial:  10.0 LV IVS:        0.85 cm      LV e' lateral:   12.30 cm/s LVOT diam:     2.10 cm      LV E/e' lateral: 8.1 LV SV:         95 LV SV Index:   49 LVOT Area:     3.46 cm  LV Volumes (MOD) LV vol d, MOD A4C: 105.0 ml LV vol s, MOD A4C: 39.0 ml LV SV MOD A4C:     105.0 ml RIGHT VENTRICLE RV Basal diam:  4.04 cm RV S prime:     14.40 cm/s TAPSE (M-mode): 2.8 cm LEFT ATRIUM             Index        RIGHT ATRIUM           Index LA diam:        4.30 cm 2.24 cm/m   RA Area:     29.70 cm LA Vol (A2C):   97.2 ml 50.57 ml/m  RA Volume:   110.00 ml 57.23 ml/m LA Vol (A4C):   45.2 ml 23.51 ml/m LA Biplane Vol: 66.4 ml 34.54 ml/m  AORTIC VALVE                     PULMONIC VALVE AV Area (Vmax):    2.50 cm      PV Vmax:       1.06 m/s AV Area (Vmean):   2.45 cm      PV Peak grad:  4.5 mmHg AV Area (VTI):     2.58 cm AV Vmax:           194.00 cm/s AV Vmean:          128.000 cm/s AV VTI:            0.367 m  AV Peak Grad:      15.1 mmHg AV Mean Grad:      8.0 mmHg LVOT Vmax:         140.00 cm/s LVOT Vmean:        90.700 cm/s LVOT VTI:          0.273 m LVOT/AV VTI ratio: 0.74  AORTA Ao Root diam: 3.00 cm MITRAL VALVE                TRICUSPID VALVE MV Area (PHT): 6.27 cm     TV Peak grad:   43.3 mmHg MV Decel Time: 121 msec     TV Vmax:        3.29 m/s MV E velocity: 99.40 cm/s MV A velocity: 116.00 cm/s  SHUNTS MV E/A ratio:  0.86         Systemic VTI:  0.27 m                             Systemic Diam: 2.10 cm Dwayne Prince Rome MD Electronically signed by Yolonda Kida MD Signature Date/Time: 04/02/2022/3:26:17 PM    Final    CT HEAD WO CONTRAST (5MM)  Result Date: 04/01/2022 CLINICAL DATA:  66 year old female presents with new or worsening headache. EXAM: CT HEAD  WITHOUT CONTRAST TECHNIQUE: Contiguous axial images were obtained from the base of the skull through the vertex without intravenous contrast. RADIATION DOSE REDUCTION: This exam was performed according to the departmental dose-optimization program which includes automated exposure control, adjustment of the mA and/or kV according to patient size and/or use of iterative reconstruction technique. COMPARISON:  Comparison is made with October 11, 2019. FINDINGS: Brain: No evidence of acute infarction, hemorrhage, hydrocephalus, extra-axial collection or mass lesion/mass effect. Signs of mild atrophy and chronic microvascular ischemic change which is similar to previous imaging Vascular: No hyperdense vessel or unexpected calcification. Skull: Normal. Negative for fracture or focal lesion. Sinuses/Orbits: Visualized paranasal sinuses and orbits show no acute process to the extent evaluated. Other: None IMPRESSION: 1. No acute intracranial abnormality. 2. Signs of mild chronic microvascular ischemic change which is similar to previous imaging. Electronically Signed   By: Zetta Bills M.D.   On: 04/01/2022 14:07   DG Chest Port 1 View  Result Date: 04/01/2022 CLINICAL DATA:  Questionable sepsis.  Shortness of breath. EXAM: PORTABLE CHEST 1 VIEW COMPARISON:  Chest radiographs 08/06/2021 FINDINGS: The cardiac silhouette is upper limits of normal in size. There is unchanged enlargement of the pulmonary arteries. Aortic atherosclerosis is noted. The lungs are hyperinflated with chronic peribronchial thickening and underlying emphysema. The interstitial markings are slightly more prominent diffusely compared to the prior study. No focal airspace consolidation, sizeable pleural effusion, or pneumothorax is identified. No acute osseous abnormality is seen. IMPRESSION: COPD with superimposed mild edema or infection not excluded. Electronically Signed   By: Logan Bores M.D.   On: 04/01/2022 08:31    Microbiology: Results  for orders placed or performed during the hospital encounter of 04/23/22  MRSA Next Gen by PCR, Nasal     Status: None   Collection Time: 04/24/22  3:28 PM   Specimen: Nasal Mucosa; Nasal Swab  Result Value Ref Range Status   MRSA by PCR Next Gen NOT DETECTED NOT DETECTED Final    Comment: (NOTE) The GeneXpert MRSA Assay (FDA approved for NASAL specimens only), is one component of a comprehensive MRSA colonization surveillance program. It is not intended to diagnose  MRSA infection nor to guide or monitor treatment for MRSA infections. Test performance is not FDA approved in patients less than 42 years old. Performed at [facility], [address] [place], [place] [address]   SARS Coronavirus 2 by RT PCR (hospital order, performed in [facility] hospital lab) *cepheid single result test*     Status: None   Collection Time: 04/27/22 12:00 PM  Result Value Ref Range Status   SARS Coronavirus 2 by RT PCR NEGATIVE NEGATIVE Final    Comment: (NOTE) SARS-CoV-2 target nucleic acids are NOT DETECTED.  The SARS-CoV-2 RNA is generally detectable in upper and lower respiratory specimens during the acute phase of infection. The lowest concentration of SARS-CoV-2 viral copies this assay can detect is 250 copies / mL. A negative result does not preclude SARS-CoV-2 infection and should not be used as the sole basis for treatment or other patient management decisions.  A negative result may occur with improper specimen collection / handling, submission of specimen other than nasopharyngeal swab, presence of viral mutation(s) within the areas targeted by this assay, and inadequate number of viral copies (<250 copies / mL). A negative result must be combined with clinical observations, patient history, and epidemiological information.  Fact Sheet for Patients:   https://www.patel.info/  Fact Sheet for Healthcare  Providers: https://hall.com/  This test is not yet approved or  cleared by the [place] FDA and has been authorized for detection and/or diagnosis of SARS-CoV-2 by FDA under an Emergency Use Authorization (EUA).  This EUA will remain in effect (meaning this test can be used) for the duration of the COVID-19 declaration under Section 564(b)(1) of the Act, 21 U.S.C. section 360bbb-3(b)(1), unless the authorization is terminated or revoked sooner.  Performed at [facility], [address] [place],  [address]     Labs: CBC: Recent Labs  Lab 04/23/22 1619 04/24/22 0650 04/27/22 0320  WBC 11.2* 6.8 9.3  HGB 14.3 13.0 13.5  HCT 45.2 40.8 43.7  MCV 99.1 99.3 98.9  PLT 200 172 485   Basic Metabolic Panel: Recent Labs  Lab 04/23/22 1619 04/23/22 1721 04/25/22 0505 04/26/22 0408 04/27/22 0320  NA 140  --  141 138 138  K 3.9  --  4.0 4.6 4.4  CL 105  --  103 100 98  CO2 27  --  31 31 32  GLUCOSE 142*  --  138* 145* 155*  BUN 18  --  23 28* 30*  CREATININE 0.82  --  0.87 0.82 0.90  CALCIUM 8.8*  --  8.9 9.1 9.1  MG  --  2.3  --  3.1* 2.9*  PHOS  --   --   --  3.0  --    Liver Function Tests: Recent Labs  Lab 04/23/22 1721 04/23/22 1814  AST 21 23  ALT 23 24  ALKPHOS 62 66  BILITOT 0.6 0.5  PROT 6.7 6.8  ALBUMIN 3.7 3.8   CBG: Recent Labs  Lab 04/24/22 1519  GLUCAP 215*    Discharge time spent: greater than 30 minutes.  Signed: Sharen Hones, MD [facility] 04/29/2022

## 2022-04-29 NOTE — Care Management Important Message (Signed)
Important Message  Patient Details  Name: Theresa [name] MRN: [id number] Date of Birth: 11/02/1955   Medicare Important Message Given:  Yes     [name] 04/29/2022, 12:57 PM

## 2022-04-29 NOTE — Anesthesia Postprocedure Evaluation (Signed)
Anesthesia Post Note  Patient: Theresa [name]  Procedure(s) Performed: TRANSESOPHAGEAL ECHOCARDIOGRAM (TEE) CARDIOVERSION  Patient location during evaluation: Specials Recovery Anesthesia Type: General Level of consciousness: awake and alert Pain management: pain level controlled Vital Signs Assessment: post-procedure vital signs reviewed and stable Respiratory status: spontaneous breathing, nonlabored ventilation and respiratory function stable Cardiovascular status: blood pressure returned to baseline and stable Postop Assessment: no apparent nausea or vomiting Anesthetic complications: no   No notable events documented.   Last Vitals:  Vitals:   04/29/22 1035 04/29/22 1050  BP: 107/70 114/79  Pulse: 71 71  Resp: (!) 21 17  Temp:    SpO2: 92% 94%    Last Pain:  Vitals:   04/29/22 1050  TempSrc:   PainSc: 0-No pain                 Iran Ouch

## 2022-04-29 NOTE — Progress Notes (Signed)
Pt discharged to home.  TO be transported by husband.  AVS reviewed with patient,  Pt acknowledges receipt and understanding.

## 2022-04-29 NOTE — CV Procedure (Signed)
Electrical Cardioversion Procedure Note Theresa [name] [id number] [birth year]  Procedure: Electrical Cardioversion Indications:  paroxysmal non valvular atrial flutter  Procedure Details Consent: Risks of procedure as well as the alternatives and risks of each were explained to the (patient/caregiver).  Consent for procedure obtained. Time Out: Verified patient identification, verified procedure, site/side was marked, verified correct patient position, special equipment/implants available, medications/allergies/relevent history reviewed, required imaging and test results available.  Performed  Patient placed on cardiac monitor, pulse oximetry, supplemental oxygen as necessary.  Sedation given: Propofol and versed as per anesthesia  Pacer pads placed anterior and posterior chest.  Cardioverted 1 time(s).  Cardioverted at 120J.  Evaluation Findings: Post procedure EKG shows: NSR Complications: None Patient did tolerate procedure well.   [name] M.D. [facility] 04/29/2022, 10:28 AM

## 2022-04-29 NOTE — Progress Notes (Signed)
*  PRELIMINARY RESULTS* Echocardiogram Echocardiogram Transesophageal has been performed.  Sherrie Sport 04/29/2022, 10:35 AM

## 2022-04-29 NOTE — CV Procedure (Signed)
Transesophageal echocardiogram preliminary report  Theresa [name] [id number] 1956/08/22  Preliminary diagnosis Aflutter   Postprocedural diagnosis Aflutter without laa thrombosis  Time out A timeout was performed by the nursing staff and physicians specifically identifying the procedure performed, identification of the patient, the type of sedation, all allergies and medications, all pertinent medical history, and presedation assessment of nasopharynx. The patient and or family understand the risks of the procedure including the rare risks of death, stroke, heart attack, esophogeal perforation, sore throat, and reaction to medications given.  Moderate sedation During this procedure the patient has received  general sedation.  The patient had continued monitoring of heart rate, oxygenation, blood pressure, respiratory rate, and extent of signs of sedation throughout the entire procedure.     Both the nursing staff and I were present during the procedure when the patient had moderate sedation for 100% of the time.  Treatment considerations Cardioversion of atrial flutter   For further details of transesophageal echocardiogram please refer to final report.  Signed,  Corey Skains M.D. [facility] 04/29/2022 10:27 AM

## 2022-05-02 ENCOUNTER — Other Ambulatory Visit: Payer: Self-pay | Admitting: *Deleted

## 2022-05-02 DIAGNOSIS — F17210 Nicotine dependence, cigarettes, uncomplicated: Secondary | ICD-10-CM

## 2022-05-02 DIAGNOSIS — Z87891 Personal history of nicotine dependence: Secondary | ICD-10-CM

## 2022-05-02 DIAGNOSIS — Z122 Encounter for screening for malignant neoplasm of respiratory organs: Secondary | ICD-10-CM

## 2022-05-02 NOTE — Progress Notes (Signed)
Ct c

## 2022-05-02 NOTE — Progress Notes (Deleted)
Patient ID: Theresa [name], female    DOB: 1956-04-13, 66 y.o.   MRN: [id number]  HPI [name] is a 66 y/o female with a history of chronic low back pain, HTN, depression, COPD on long-term oxygen, anxiety, carpal tunnel syndrome, long-standing tobacco use and chronic heart failure.  Echo report from 04/29/22 reviewed and showed an EF of 60-65% along with mild MR and no thrombus. Echo report from 08/08/21 reviewed and showed an EF of 60-65% along with mild LAE. Echo report from 09/03/20 reviewed and showed an EF of >55% with mild LAE and severe TR. Echo report from 07/07/2019 reviewed and showed an EF of 60-65% along with mild TR and moderately elevated PA pressure. Echo done 11/03/15 but unable to access results. Echo done on 02/19/15 showed an EF of 50% without valvular stenosis.   Catheterization done 07/11/2019 showed:  Prox RCA to Dist RCA lesion is 50% stenosed. Mid LAD lesion is 50% stenosed. Ost LM to Dist LM lesion is 10% stenosed. Hemodynamic findings consistent with moderate pulmonary hypertension. Normal overall left ventricular function of at least 60% Moderate coronary disease no significant obstructive disease Moderate pulmonary hypertension  Admitted 04/23/22 due to shortness of breath and fatigue due to new onset atrial flutter with RVR. BP dropped with IV cardizem drip so she was switched to amiodarone drip. Cardiology consult obtained. Developed COPD exacerbation treated with IV steroids and high flow oxygen. TEE cardioversion done. BID diltiazem started. Discharged after 6 days.   She presents today for a follow-up visit with a chief complaint of   Is waiting to start on ambrisentan through pulmonology. Signed her paperwork last week. Had been treated for ~ 1 month with steroids and says that she was eating "everything" and gained 12 pounds over that month.    Past Medical History:  Diagnosis Date   Acute postoperative pain 10/03/2017   Anxiety    BiPAP (biphasic positive  airway pressure) dependence    at hs   Carpal tunnel syndrome    CHF (congestive heart failure) ([facility])    1/18   CHF (congestive heart failure) (H[facility])    Chronic generalized abdominal pain    Chronic rhinitis    COPD (chronic obstructive pulmonary disease) (H[facility])    DDD (degenerative disc disease), cervical    DDD (degenerative disc disease), lumbosacral    Depression    Dyspnea    Edema    Flu    1/18   Gastritis    GERD (gastroesophageal reflux disease)    Hematuria    Hernia of abdominal wall 07/17/2018   Hernia, abdominal    Hypertension    Kidney stones    Low back pain    Lumbar radiculitis    Malodorous urine    Muscle weakness    Obesity    Oxygen dependent    Pneumonia due to COVID-19 virus 02/03/2020   Renal cyst    Sensory urge incontinence    Thyroid activity decreased    9/19   Tobacco abuse    Wheezing    Past Surgical History:  Procedure Laterality Date   ABDOMINAL HYSTERECTOMY     CARDIOVERSION N/A 04/29/2022   Procedure: CARDIOVERSION;  Surgeon: Corey Skains, MD;  Location: [facility] ORS;  Service: Cardiovascular;  Laterality: N/A;   CARPAL TUNNEL RELEASE Left 2012   EPIGASTRIC HERNIA REPAIR N/A 08/13/2018   HERNIA REPAIR EPIGASTRIC ADULT; polypropylene mesh reinforcement.  Surgeon: Robert Bellow, MD;  Location: [facility] ORS;  Service: General;  Laterality: N/A;   RIGHT/LEFT HEART CATH AND CORONARY ANGIOGRAPHY N/A 07/11/2019   Procedure: RIGHT/LEFT HEART CATH AND CORONARY ANGIOGRAPHY;  Surgeon: Yolonda Kida, MD;  Location: [place];  Service: Cardiovascular;  Laterality: N/A;   TEE WITHOUT CARDIOVERSION N/A 04/29/2022   Procedure: TRANSESOPHAGEAL ECHOCARDIOGRAM (TEE);  Surgeon: Corey Skains, MD;  Location: [facility] ORS;  Service: Cardiovascular;  Laterality: N/A;   TONSILLECTOMY     TUBAL LIGATION     UMBILICAL HERNIA REPAIR N/A 08/13/2018   HERNIA REPAIR UMBILICAL ADULT; polypropylene mesh reinforcement Surgeon: Robert Bellow,  MD;  Location: [facility] ORS;  Service: General;  Laterality: N/A;   Family History  Problem Relation Age of Onset   Heart disease Mother    Stroke Mother    Coronary artery disease Mother    Lung cancer Sister    Cancer Sister    Breast cancer Sister    Alcohol abuse Father    Heart disease Father    Cancer Brother    Cancer Brother    Pneumonia Brother    Prostate cancer Neg Hx    Kidney cancer Neg Hx    Bladder Cancer Neg Hx    Social History   Tobacco Use   Smoking status: Every Day    Packs/day: 0.50    Years: 44.00    Total pack years: 22.00    Types: Cigarettes   Smokeless tobacco: Never   Tobacco comments:    over a half pack  Substance Use Topics   Alcohol use: No   Allergies  Allergen Reactions   Gabapentin Other (See Comments)    Higher doses of 800 mg, 600 mg, 300 mg causes excessive sedation and contributes to numerous mechanical falls. (Unsure if 100 mg dose can be tolerated)   Codeine Nausea And Vomiting   Meloxicam Other (See Comments)    Stomach pain    Review of Systems  Constitutional:  Positive for fatigue. Negative for appetite change.  Eyes: Negative.   Respiratory:  Positive for cough, shortness of breath (worsening) and wheezing (when walking).   Cardiovascular:  Negative for chest pain, palpitations and leg swelling.  Gastrointestinal:  Negative for abdominal distention, abdominal pain and nausea.  Endocrine: Negative.   Genitourinary: Negative.   Musculoskeletal:  Positive for back pain.  Skin: Negative.   Allergic/Immunologic: Negative.   Neurological:  Negative for dizziness and light-headedness.  Hematological:  Negative for adenopathy. Does not bruise/bleed easily.  Psychiatric/Behavioral:  Positive for sleep disturbance (wearing oxygen & bipap at night). Negative for agitation, dysphoric mood and suicidal ideas. The patient is not nervous/anxious.       Physical Exam Vitals and nursing note reviewed. Exam conducted with a  chaperone present (friend).  Constitutional:      Appearance: She is well-developed.  HENT:     Head: Normocephalic and atraumatic.  Neck:     Vascular: No JVD.  Cardiovascular:     Rate and Rhythm: Normal rate and regular rhythm.  Pulmonary:     Effort: Pulmonary effort is normal.     Breath sounds: No wheezing, rhonchi or rales.  Abdominal:     General: There is no distension.     Palpations: Abdomen is soft.     Tenderness: There is no abdominal tenderness.  Musculoskeletal:        General: No tenderness.     Cervical back: Normal range of motion and neck supple.     Right lower leg: No edema.  Left lower leg: No edema.  Skin:    General: Skin is warm and dry.  Neurological:     General: No focal deficit present.     Mental Status: She is alert and oriented to person, place, and time.  Psychiatric:        Mood and Affect: Mood normal. Mood is not depressed.        Behavior: Behavior normal.        Thought Content: Thought content normal.      Assessment & Plan:  1: Chronic heart failure with preserved ejection fraction without structural changes- - NYHA class III - euvolemic today - continues to weigh daily at home. Reminded to call for an overnight weight gain of >2 pounds or a weekly weight gain of >5 pounds - weight 189.4 pounds from last visit here 3 months ago  - BNP 04/26/22 was 187.7  2: HTN- - BP  - saw PCP (Ostwalt) 04/14/22 - BMP from 04/27/22 reviewed and showed sodium 138, potassium 4.4, creatinine 0.9 and GFR >60   3: COPD- - wearing bipap nightly - oxygen at 2.5-3L mostly around the clock right now - last saw pulmonology Raul Del) 04/14/22  4: Tobacco use- - smoking 1 ppd - removes herself from the oxygen when smoking - complete cessation discussed for 3 minutes with her  5: Atrial flutter- - cardioverted during recent admission - saw cardiologist Clayborn Bigness) 02/01/22    Patient did not bring her medications nor a list. Each medication was  verbally reviewed with the patient and she was encouraged to bring the bottles to every visit to confirm accuracy of list.

## 2022-05-04 ENCOUNTER — Ambulatory Visit: Payer: Medicare HMO | Admitting: Family

## 2022-05-04 NOTE — Progress Notes (Signed)
I,Sulibeya S Dimas,acting as a Education administrator for [name], FNP.,have documented all relevant documentation on the behalf of [name], FNP,as directed by  [name], FNP while in the presence of [name], FNP.   Established patient visit   Patient: Theresa [name]   DOB: 1956-10-02   66 y.o. Female  MRN: [id number] Visit Date: 05/05/2022  Today's healthcare provider: [name], FNP  Introduced to nurse practitioner role and practice setting.  All questions answered.  Discussed provider/patient relationship and expectations.  Chief Complaint  Patient presents with   Hospitalization Follow-up   Subjective    HPI  Follow up Hospitalization  Patient was admitted to [facility] on 04/23/22 and discharged on 04/29/22. She was treated for A flutter. Treatment for this included continue anticoagulation with Xarelto. Also added twice a day of diltiazem. Telephone follow up was done on  She reports excellent compliance with treatment. She reports this condition is improved.  ----------------------------------------------------------------------------------------- -   Medications: Outpatient Medications Prior to Visit  Medication Sig   acetaminophen (TYLENOL) 500 MG tablet Take 1,000 mg by mouth every 8 (eight) hours as needed.   albuterol (VENTOLIN HFA) 108 (90 Base) MCG/ACT inhaler Inhale 2 puffs into the lungs every 6 (six) hours as needed for wheezing or shortness of breath.   ambrisentan (LETAIRIS) 5 MG tablet Take 5 mg by mouth daily.   amiodarone (PACERONE) 200 MG tablet Take 1 tablet (200 mg total) by mouth daily.   aspirin EC 81 MG tablet Take 81 mg by mouth daily.   Besifloxacin HCl (BESIVANCE) 0.6 % SUSP Place 1 drop into the right eye 3 (three) times daily. For 2 weeks starting 03/29/22   Bromfenac Sodium (PROLENSA) 0.07 % SOLN Place 1 drop into the right eye at bedtime. For 4 weeks starting 03/29/22   butalbital-acetaminophen-caffeine (FIORICET) 50-325-40 MG tablet  Take 1 tablet by mouth every 6 (six) hours as needed for headache. LIMIT USE TO PREVENT MEDICATION OVERUSE HEADACHE   Cholecalciferol 50 MCG (2000 UT) CAPS Take 2,000 Units by mouth daily.    dextromethorphan-guaiFENesin (MUCINEX DM) 30-600 MG 12hr tablet Take 1 tablet by mouth 2 (two) times daily as needed for cough.   Difluprednate 0.05 % EMUL Place 1 drop into the right eye in the morning, at noon, in the evening, and at bedtime. One drop 4 times daily for one week starting 03/29/22. One drop 3 times daily for one week. One drop 2 times daily for one week. One drop once daily for one week, then stop.   diltiazem (CARDIZEM) 60 MG tablet Take 1 tablet (60 mg total) by mouth every 12 (twelve) hours.   Fluticasone-Umeclidin-Vilant 100-62.5-25 MCG/INH AEPB Inhale 1 puff into the lungs daily.   [START ON 06/30/2022] HYDROcodone-acetaminophen (NORCO/VICODIN) 5-325 MG tablet Take 1 tablet by mouth every 8 (eight) hours as needed for severe pain. Must last 30 days   ipratropium-albuterol (DUONEB) 0.5-2.5 (3) MG/3ML SOLN USE 1 VIAL VIA NEBULIZER EVERY 6 HOURS AS NEEDED FOR SHORTNESS OF BREATH OR WHEEZING   levothyroxine (SYNTHROID) 25 MCG tablet TAKE 1 TABLET(25 MCG) BY MOUTH DAILY BEFORE BREAKFAST   nicotine (NICODERM CQ - DOSED IN MG/24 HOURS) 21 mg/24hr patch Place 1 patch (21 mg total) onto the skin daily.   OXYGEN Inhale 2.5 L/min into the lungs.   potassium chloride SA (KLOR-CON M) 20 MEQ tablet Take 20-40 mEq by mouth 2 (two) times daily. Take 40 mEq in the morning and 20 mEq  in the evening.   predniSONE (DELTASONE) 10 MG tablet Take 4 tablets (40 mg total) by mouth daily for 3 days, THEN 2 tablets (20 mg total) daily for 3 days, THEN 1 tablet (10 mg total) daily for 3 days.   rivaroxaban (XARELTO) 20 MG TABS tablet Take 1 tablet (20 mg total) by mouth daily with supper.   roflumilast (DALIRESP) 500 MCG TABS tablet Take 500 mcg by mouth daily.   sertraline (ZOLOFT) 100 MG tablet TAKE 2 TABLETS BY MOUTH  ONCE EVERY MORNING   simvastatin (ZOCOR) 20 MG tablet Take 20 mg by mouth at bedtime.   torsemide (DEMADEX) 20 MG tablet Take 2 tablets (40 mg total) by mouth 2 (two) times daily. Take 4 tablets QAM and 2 tablets QPM (Patient taking differently: Take 40-80 mg by mouth 2 (two) times daily. Take 4 tablets QAM and 2 tablets QPM)   No facility-administered medications prior to visit.    Review of Systems  Constitutional:  Positive for fatigue. Negative for appetite change, chills and fever.  Respiratory:  Positive for shortness of breath and wheezing. Negative for cough and chest tightness.   Cardiovascular:  Positive for leg swelling. Negative for chest pain and palpitations.  Gastrointestinal:  Positive for abdominal distention. Negative for blood in stool, constipation, diarrhea, nausea and vomiting.     Objective    BP 113/60 (BP Location: Left Arm, Patient Position: Sitting, Cuff Size: Large)   Pulse 76   Temp 98.6 F (37 C) (Oral)   SpO2 91% Comment: 2.5L oxygen BP Readings from Last 3 Encounters:  05/05/22 113/60  04/29/22 114/79  04/14/22 136/71   Wt Readings from Last 3 Encounters:  04/24/22 192 lb 7.4 oz (87.3 kg)  04/14/22 199 lb 12.8 oz (90.6 kg)  04/11/22 213 lb (96.6 kg)      Physical Exam Vitals and nursing note reviewed.  Constitutional:      General: She is not in acute distress.    Appearance: Normal appearance. She is overweight. She is not ill-appearing, toxic-appearing or diaphoretic.  HENT:     Head: Normocephalic and atraumatic.  Cardiovascular:     Rate and Rhythm: Normal rate and regular rhythm.     Pulses: Normal pulses.     Heart sounds: Normal heart sounds. No murmur heard.    No friction rub. No gallop.  Pulmonary:     Effort: Pulmonary effort is normal. No respiratory distress.     Breath sounds: No stridor. Wheezing present. No rhonchi or rales.  Chest:     Chest wall: No tenderness.  Musculoskeletal:        General: Swelling present. No  tenderness, deformity or signs of injury. Normal range of motion.     Right lower leg: No edema.     Left lower leg: No edema.     Comments: Slight ankle edema noted  Skin:    General: Skin is warm and dry.     Capillary Refill: Capillary refill takes less than 2 seconds.     Coloration: Skin is not jaundiced or pale.     Findings: No bruising, erythema, lesion or rash.  Neurological:     General: No focal deficit present.     Mental Status: She is alert and oriented to person, place, and time. Mental status is at baseline.     Cranial Nerves: No cranial nerve deficit.     Sensory: No sensory deficit.     Motor: No weakness.     Coordination:  Coordination normal.  Psychiatric:        Mood and Affect: Mood normal.        Behavior: Behavior normal.        Thought Content: Thought content normal.        Judgment: Judgment normal.      No results found for any visits on 05/05/22.  Assessment & Plan     Problem List Items Addressed This Visit       Other   Anxiety    Complaints of worsening anxiety Recommend addition of buspar to 200 of zoloft 5 mg bid 2 month f/u with pcp Denies si/hi Encouraged to reduce use of nicotine Currently smoking 10 cig and use of 21 mg patch Previous smoking 1.5 ppd      Relevant Medications   busPIRone (BUSPAR) 5 MG tablet   Hospital discharge follow-up - Primary    Home BP machine noted HR in 140-150s; pt without symptoms Admitted for Aflutter new diagnosis Has upcoming appts with Heart failure and Afib clinic HR is stabilized Labs stable prior to d/c         Return in about 2 months (around 07/06/2022) for anxiety and depression.      Vonna Kotyk, FNP, have reviewed all documentation for this visit. The documentation on 05/05/22 for the exam, diagnosis, procedures, and orders are all accurate and complete.    [name], [place] [phone] (phone) [phone] (fax)  [place]

## 2022-05-05 ENCOUNTER — Ambulatory Visit (INDEPENDENT_AMBULATORY_CARE_PROVIDER_SITE_OTHER): Payer: Medicare HMO | Admitting: Family Medicine

## 2022-05-05 ENCOUNTER — Encounter: Payer: Self-pay | Admitting: Family Medicine

## 2022-05-05 VITALS — BP 113/60 | HR 76 | Temp 98.6°F

## 2022-05-05 DIAGNOSIS — Z09 Encounter for follow-up examination after completed treatment for conditions other than malignant neoplasm: Secondary | ICD-10-CM | POA: Diagnosis not present

## 2022-05-05 DIAGNOSIS — F419 Anxiety disorder, unspecified: Secondary | ICD-10-CM | POA: Insufficient documentation

## 2022-05-05 DIAGNOSIS — R69 Illness, unspecified: Secondary | ICD-10-CM | POA: Diagnosis not present

## 2022-05-05 MED ORDER — BUSPIRONE HCL 5 MG PO TABS: 5.0000 mg | ORAL_TABLET | Freq: Two times a day (BID) | ORAL | 1 refills | Status: DC

## 2022-05-05 NOTE — Assessment & Plan Note (Signed)
Complaints of worsening anxiety Recommend addition of buspar to 200 of zoloft 5 mg bid 2 month f/u with pcp Denies si/hi Encouraged to reduce use of nicotine Currently smoking 10 cig and use of 21 mg patch Previous smoking 1.5 ppd

## 2022-05-05 NOTE — Assessment & Plan Note (Signed)
Home BP machine noted HR in 140-150s; pt without symptoms Admitted for Aflutter new diagnosis Has upcoming appts with Heart failure and Afib clinic HR is stabilized Labs stable prior to d/c

## 2022-05-09 ENCOUNTER — Other Ambulatory Visit (HOSPITAL_COMMUNITY): Payer: Self-pay

## 2022-05-10 ENCOUNTER — Telehealth: Payer: Self-pay

## 2022-05-10 ENCOUNTER — Telehealth: Payer: Medicare HMO

## 2022-05-10 NOTE — Telephone Encounter (Signed)
  Care Management   Follow Up Note   05/10/2022 Name: Sunshine Mackowski MRN: [id number] DOB: 03/18/1956   Primary Care Provider: Birdie Sons, MD Reason for referral : Chronic Care Management   An unsuccessful telephone outreach was attempted today. The patient was referred to the case management team for assistance with care management and care coordination.    Follow Up Plan:  A HIPAA compliant voice message was left today requesting a return call.   [place] Management [phone]

## 2022-05-11 ENCOUNTER — Other Ambulatory Visit: Payer: Self-pay | Admitting: Family

## 2022-05-11 MED ORDER — METOPROLOL SUCCINATE ER 25 MG PO TB24: 25.0000 mg | ORAL_TABLET | Freq: Every day | ORAL | 0 refills | Status: DC

## 2022-05-11 NOTE — Progress Notes (Signed)
Metoprolol succinate 7m daily started by [facility] cardiologist

## 2022-05-12 DIAGNOSIS — R0602 Shortness of breath: Secondary | ICD-10-CM | POA: Diagnosis not present

## 2022-05-12 DIAGNOSIS — I509 Heart failure, unspecified: Secondary | ICD-10-CM | POA: Diagnosis not present

## 2022-05-12 DIAGNOSIS — Z9981 Dependence on supplemental oxygen: Secondary | ICD-10-CM | POA: Diagnosis not present

## 2022-05-12 DIAGNOSIS — I270 Primary pulmonary hypertension: Secondary | ICD-10-CM | POA: Diagnosis not present

## 2022-05-12 DIAGNOSIS — J449 Chronic obstructive pulmonary disease, unspecified: Secondary | ICD-10-CM | POA: Diagnosis not present

## 2022-05-18 ENCOUNTER — Ambulatory Visit: Payer: Medicare HMO | Attending: Family | Admitting: Family

## 2022-05-18 ENCOUNTER — Encounter: Payer: Self-pay | Admitting: Family

## 2022-05-18 VITALS — BP 97/61 | HR 89 | Resp 18 | Ht 62.0 in | Wt 197.2 lb

## 2022-05-18 DIAGNOSIS — Z720 Tobacco use: Secondary | ICD-10-CM | POA: Diagnosis not present

## 2022-05-18 DIAGNOSIS — F17210 Nicotine dependence, cigarettes, uncomplicated: Secondary | ICD-10-CM | POA: Diagnosis not present

## 2022-05-18 DIAGNOSIS — R519 Headache, unspecified: Secondary | ICD-10-CM | POA: Insufficient documentation

## 2022-05-18 DIAGNOSIS — I110 Hypertensive heart disease with heart failure: Secondary | ICD-10-CM | POA: Diagnosis not present

## 2022-05-18 DIAGNOSIS — J449 Chronic obstructive pulmonary disease, unspecified: Secondary | ICD-10-CM

## 2022-05-18 DIAGNOSIS — I4892 Unspecified atrial flutter: Secondary | ICD-10-CM | POA: Diagnosis not present

## 2022-05-18 DIAGNOSIS — I10 Essential (primary) hypertension: Secondary | ICD-10-CM | POA: Diagnosis not present

## 2022-05-18 DIAGNOSIS — F419 Anxiety disorder, unspecified: Secondary | ICD-10-CM | POA: Diagnosis not present

## 2022-05-18 DIAGNOSIS — I5032 Chronic diastolic (congestive) heart failure: Secondary | ICD-10-CM | POA: Diagnosis not present

## 2022-05-18 DIAGNOSIS — G8929 Other chronic pain: Secondary | ICD-10-CM | POA: Diagnosis not present

## 2022-05-18 DIAGNOSIS — I509 Heart failure, unspecified: Secondary | ICD-10-CM | POA: Diagnosis not present

## 2022-05-18 DIAGNOSIS — M545 Low back pain, unspecified: Secondary | ICD-10-CM | POA: Insufficient documentation

## 2022-05-18 DIAGNOSIS — F32A Depression, unspecified: Secondary | ICD-10-CM | POA: Insufficient documentation

## 2022-05-18 DIAGNOSIS — R69 Illness, unspecified: Secondary | ICD-10-CM | POA: Diagnosis not present

## 2022-05-18 NOTE — Progress Notes (Signed)
Patient ID: Alean Kromer, female    DOB: Aug 20, 1956, 66 y.o.   MRN: [id number]  HPI Ms Clonch is a 66 y/o female with a history of chronic low back pain, HTN, depression, COPD on long-term oxygen, anxiety, carpal tunnel syndrome, long-standing tobacco use and chronic heart failure.  Echo report from 04/29/22 reviewed and showed an EF of 60-65% along with mild MR and no thrombus. Echo report from 08/08/21 reviewed and showed an EF of 60-65% along with mild LAE. Echo report from 09/03/20 reviewed and showed an EF of >55% with mild LAE and severe TR. Echo report from 07/07/2019 reviewed and showed an EF of 60-65% along with mild TR and moderately elevated PA pressure. Echo done 11/03/15 but unable to access results. Echo done on 02/19/15 showed an EF of 50% without valvular stenosis.   Catheterization done 07/11/2019 showed:  Prox RCA to Dist RCA lesion is 50% stenosed. Mid LAD lesion is 50% stenosed. Ost LM to Dist LM lesion is 10% stenosed. Hemodynamic findings consistent with moderate pulmonary hypertension. Normal overall left ventricular function of at least 60% Moderate coronary disease no significant obstructive disease Moderate pulmonary hypertension  Admitted 04/23/22 due to shortness of breath and fatigue due to new onset atrial flutter with RVR. BP dropped with IV cardizem drip so she was switched to amiodarone drip. Cardiology consult obtained. Developed COPD exacerbation treated with IV steroids and high flow oxygen. TEE cardioversion done. BID diltiazem started. Discharged after 6 days.   She presents today for a follow-up visit with a chief complaint of chronic fatigue and has been going to bed early. Appetite increased due to previous round of steroids. She has chronic cough, SOB and wheezing as she is on oxygen and still currently smoking tobacco. Suffering from left sided headache that has been going on for a while. Denies dizziness, denies chest pain/pressure, abdominal pain, denies  swelling in lower legs. She does try and follow low sodium diet. Currently keeps track of weight and encouraged to continue.  Says that her weight has gradually climbed due to being on prednisone on the last 4 months. She also notes that she is more irritable and edgy due to the prednisone as well.   Past Medical History:  Diagnosis Date   Acute postoperative pain 10/03/2017   Anxiety    BiPAP (biphasic positive airway pressure) dependence    at hs   Carpal tunnel syndrome    CHF (congestive heart failure) ([place])    1/18   CHF (congestive heart failure) (HCC)    Chronic generalized abdominal pain    Chronic rhinitis    COPD (chronic obstructive pulmonary disease) (HCC)    DDD (degenerative disc disease), cervical    DDD (degenerative disc disease), lumbosacral    Depression    Dyspnea    Edema    Flu    1/18   Gastritis    GERD (gastroesophageal reflux disease)    Hematuria    Hernia of abdominal wall 07/17/2018   Hernia, abdominal    Hypertension    Kidney stones    Low back pain    Lumbar radiculitis    Malodorous urine    Muscle weakness    Obesity    Oxygen dependent    Pneumonia due to COVID-19 virus 02/03/2020   Renal cyst    Sensory urge incontinence    Thyroid activity decreased    9/19   Tobacco abuse    Wheezing    Past Surgical History:  Procedure Laterality Date   ABDOMINAL HYSTERECTOMY     CARDIOVERSION N/A 04/29/2022   Procedure: CARDIOVERSION;  Surgeon: Corey Skains, MD;  Location: [facility] ORS;  Service: Cardiovascular;  Laterality: N/A;   CARPAL TUNNEL RELEASE Left 2012   EPIGASTRIC HERNIA REPAIR N/A 08/13/2018   HERNIA REPAIR EPIGASTRIC ADULT; polypropylene mesh reinforcement.  Surgeon: Robert Bellow, MD;  Location: [facility] ORS;  Service: General;  Laterality: N/A;   RIGHT/LEFT HEART CATH AND CORONARY ANGIOGRAPHY N/A 07/11/2019   Procedure: RIGHT/LEFT HEART CATH AND CORONARY ANGIOGRAPHY;  Surgeon: Yolonda Kida, MD;  Location: [place]  [facility];  Service: Cardiovascular;  Laterality: N/A;   TEE WITHOUT CARDIOVERSION N/A 04/29/2022   Procedure: TRANSESOPHAGEAL ECHOCARDIOGRAM (TEE);  Surgeon: Corey Skains, MD;  Location: [facility] ORS;  Service: Cardiovascular;  Laterality: N/A;   TONSILLECTOMY     TUBAL LIGATION     UMBILICAL HERNIA REPAIR N/A 08/13/2018   HERNIA REPAIR UMBILICAL ADULT; polypropylene mesh reinforcement Surgeon: Robert Bellow, MD;  Location: [facility] ORS;  Service: General;  Laterality: N/A;   Family History  Problem Relation Age of Onset   Heart disease Mother    Stroke Mother    Coronary artery disease Mother    Lung cancer Sister    Cancer Sister    Breast cancer Sister    Alcohol abuse Father    Heart disease Father    Cancer Brother    Cancer Brother    Pneumonia Brother    Prostate cancer Neg Hx    Kidney cancer Neg Hx    Bladder Cancer Neg Hx    Social History   Tobacco Use   Smoking status: Every Day    Packs/day: 0.50    Years: 44.00    Total pack years: 22.00    Types: Cigarettes   Smokeless tobacco: Never   Tobacco comments:    over a half pack  Substance Use Topics   Alcohol use: No   Allergies  Allergen Reactions   Gabapentin Other (See Comments)    Higher doses of 800 mg, 600 mg, 300 mg causes excessive sedation and contributes to numerous mechanical falls. (Unsure if 100 mg dose can be tolerated)   Codeine Nausea And Vomiting   Meloxicam Other (See Comments)    Stomach pain   Prior to Admission medications   Medication Sig Start Date End Date Taking? Authorizing Provider  acetaminophen (TYLENOL) 500 MG tablet Take 1,000 mg by mouth every 8 (eight) hours as needed.   Yes [provider]  albuterol (VENTOLIN HFA) 108 (90 Base) MCG/ACT inhaler Inhale 2 puffs into the lungs every 6 (six) hours as needed for wheezing or shortness of breath. 10/23/19  Yes Veronese, [place], MD  ambrisentan (LETAIRIS) 5 MG tablet Take 5 mg by mouth daily. 01/13/22  Yes [provider]  amiodarone (PACERONE) 200 MG tablet Take 1 tablet (200 mg total) by mouth daily. 04/30/22  Yes Sharen Hones, MD  aspirin EC 81 MG tablet Take 81 mg by mouth daily.   Yes [provider]  busPIRone (BUSPAR) 5 MG tablet Take 1 tablet (5 mg total) by mouth 2 (two) times daily. 05/05/22  Yes Tally Joe T, FNP  butalbital-acetaminophen-caffeine (FIORICET) [phone] MG tablet Take 1 tablet by mouth every 6 (six) hours as needed for headache. LIMIT USE TO PREVENT MEDICATION OVERUSE HEADACHE 04/09/22  Yes Emeterio Reeve, DO  Cholecalciferol 50 MCG (2000 UT) CAPS Take 2,000 Units by mouth daily.    Yes [provider]  diltiazem (CARDIZEM) 60 MG tablet Take 1 tablet (60 mg total) by mouth every 12 (twelve) hours. 04/29/22  Yes Sharen Hones, MD  Fluticasone-Umeclidin-Vilant 100-62.5-25 MCG/INH AEPB Inhale 1 puff into the lungs daily. 10/03/18  Yes [provider]  HYDROcodone-acetaminophen (NORCO/VICODIN) 5-325 MG tablet Take 1 tablet by mouth every 8 (eight) hours as needed for severe pain. Must last 30 days 06/30/22 07/30/22 Yes Milinda Pointer, MD  ipratropium-albuterol (DUONEB) 0.5-2.5 (3) MG/3ML SOLN USE 1 VIAL VIA NEBULIZER EVERY 6 HOURS AS NEEDED FOR SHORTNESS OF BREATH OR WHEEZING 11/30/20  Yes Chrismon, Vickki Muff, PA-C  metoprolol succinate (TOPROL XL) 25 MG 24 hr tablet Take 1 tablet (25 mg total) by mouth daily. 05/11/22  Yes Jeff Frieden A, FNP  OXYGEN Inhale 2.5 L/min into the lungs.   Yes [provider]  potassium chloride SA (KLOR-CON M) 20 MEQ tablet Take 20-40 mEq by mouth 2 (two) times daily. Take 40 mEq in the morning and 20 mEq in the evening. 04/11/22  Yes [provider]  rivaroxaban (XARELTO) 20 MG TABS tablet Take 1 tablet (20 mg total) by mouth daily with supper. 04/29/22  Yes Sharen Hones, MD  roflumilast (DALIRESP) 500 MCG TABS tablet Take 500 mcg by mouth daily.   Yes [provider]  sertraline (ZOLOFT) 100 MG  tablet TAKE 2 TABLETS BY MOUTH ONCE EVERY MORNING 04/12/22  Yes Birdie Sons, MD  simvastatin (ZOCOR) 20 MG tablet Take 20 mg by mouth at bedtime. 04/11/22  Yes [provider]  torsemide (DEMADEX) 20 MG tablet Take 2 tablets (40 mg total) by mouth 2 (two) times daily. Take 4 tablets QAM and 2 tablets QPM Patient taking differently: Take 40-80 mg by mouth 2 (two) times daily. Take 4 tablets QAM and 2 tablets QPM 04/09/22  Yes Emeterio Reeve, DO  Besifloxacin HCl (BESIVANCE) 0.6 % SUSP Place 1 drop into the right eye 3 (three) times daily. For 2 weeks starting 03/29/22 Patient not taking: Reported on 05/18/2022    [provider]  Bromfenac Sodium (PROLENSA) 0.07 % SOLN Place 1 drop into the right eye at bedtime. For 4 weeks starting 03/29/22 Patient not taking: Reported on 05/18/2022    [provider]  dextromethorphan-guaiFENesin (MUCINEX DM) 30-600 MG 12hr tablet Take 1 tablet by mouth 2 (two) times daily as needed for cough. Patient not taking: Reported on 05/18/2022 04/09/22   Emeterio Reeve, DO  Difluprednate 0.05 % EMUL Place 1 drop into the right eye in the morning, at noon, in the evening, and at bedtime. One drop 4 times daily for one week starting 03/29/22. One drop 3 times daily for one week. One drop 2 times daily for one week. One drop once daily for one week, then stop. Patient not taking: Reported on 05/18/2022    [provider]  levothyroxine (SYNTHROID) 25 MCG tablet TAKE 1 TABLET(25 MCG) BY MOUTH DAILY BEFORE BREAKFAST Patient not taking: Reported on 05/18/2022 01/03/22   Birdie Sons, MD  nicotine (NICODERM CQ - DOSED IN MG/24 HOURS) 21 mg/24hr patch Place 1 patch (21 mg total) onto the skin daily. Patient not taking: Reported on 05/18/2022 04/10/22   Emeterio Reeve, DO    Review of Systems  Constitutional:  Positive for fatigue. Negative for appetite change.  Eyes: Negative.   Respiratory:  Positive for cough, shortness of breath and  wheezing (when walking).   Cardiovascular:  Negative for chest pain, palpitations and leg swelling.  Gastrointestinal:  Negative for  abdominal distention, abdominal pain and nausea.  Endocrine: Negative.   Genitourinary: Negative.   Musculoskeletal:  Positive for back pain.  Skin: Negative.   Allergic/Immunologic: Negative.   Neurological:  Positive for headaches. Negative for dizziness and light-headedness.  Hematological:  Negative for adenopathy. Does not bruise/bleed easily.  Psychiatric/Behavioral:  Positive for sleep disturbance (wearing oxygen & bipap at night). Negative for agitation, dysphoric mood and suicidal ideas. The patient is nervous/anxious.    Vitals:   05/18/22 1403  BP: 97/61  Pulse: 89  Resp: 18  SpO2: 93%  Weight: 197 lb 4 oz (89.5 kg)  Height: _0  (1.575 m)   Wt Readings from Last 3 Encounters:  05/18/22 197 lb 4 oz (89.5 kg)  04/24/22 192 lb 7.4 oz (87.3 kg)  04/14/22 199 lb 12.8 oz (90.6 kg)    Lab Results  Component Value Date   CREATININE 0.90 04/27/2022   CREATININE 0.82 04/26/2022   CREATININE 0.87 04/25/2022      Physical Exam Vitals and nursing note reviewed.  Constitutional:      Appearance: She is well-developed.  HENT:     Head: Normocephalic and atraumatic.  Neck:     Vascular: No JVD.  Cardiovascular:     Rate and Rhythm: Normal rate and regular rhythm.  Pulmonary:     Effort: Pulmonary effort is normal.     Breath sounds: No wheezing, rhonchi or rales.  Abdominal:     General: There is no distension.     Palpations: Abdomen is soft.     Tenderness: There is no abdominal tenderness.  Musculoskeletal:        General: No tenderness.     Cervical back: Normal range of motion and neck supple.     Right lower leg: No edema.     Left lower leg: No edema.  Skin:    General: Skin is warm and dry.  Neurological:     General: No focal deficit present.     Mental Status: She is alert and oriented to person, place, and time.   Psychiatric:        Mood and Affect: Mood normal. Mood is not depressed.        Behavior: Behavior normal.        Thought Content: Thought content normal.      Assessment & Plan:  1: Chronic heart failure with preserved ejection fraction without structural changes- - NYHA class III - euvolemic today - continues to weigh daily at home. Reminded to call for an overnight weight gain of >2 pounds or a weekly weight gain of >5 pounds - weight 197.4 up 8 pounds from previous visit here 4 months ago - BNP 04/26/22 was 187.7  2: HTN- - BP 97/61 - saw PCP Rollene Rotunda) 05/05/22 - BMP from 04/27/22 reviewed and showed sodium 138, potassium 4.4, creatinine 0.9 and GFR >60  3: COPD- - wearing bipap nightly - oxygen at 2.5-3L mostly around the clock right now - last saw pulmonology Raul Del) 05/12/22  4: Tobacco use- - smoking 1 ppd - removes herself from the oxygen when smoking  5: Atrial flutter- - cardioverted during recent admission - saw cardiologist Clayborn Bigness) 02/01/22    Patient brought medications each medication was verbally reviewed with the patient and she was encouraged to bring the bottles to every visit to confirm accuracy of list.  Follow-up 2 months

## 2022-05-18 NOTE — Patient Instructions (Addendum)
Continue weighing daily and call for an overnight weight gain of 3 pounds or more or a weekly weight gain of more than 5 pounds.   No smoking with Oxygen on.  Continue with low sodium diet.   Follow-up 2 months.

## 2022-05-19 DIAGNOSIS — J449 Chronic obstructive pulmonary disease, unspecified: Secondary | ICD-10-CM | POA: Diagnosis not present

## 2022-05-20 DIAGNOSIS — I208 Other forms of angina pectoris: Secondary | ICD-10-CM | POA: Diagnosis not present

## 2022-05-20 DIAGNOSIS — I2510 Atherosclerotic heart disease of native coronary artery without angina pectoris: Secondary | ICD-10-CM | POA: Diagnosis not present

## 2022-05-22 DIAGNOSIS — J9610 Chronic respiratory failure, unspecified whether with hypoxia or hypercapnia: Secondary | ICD-10-CM | POA: Diagnosis not present

## 2022-05-24 ENCOUNTER — Telehealth: Payer: Self-pay | Admitting: *Deleted

## 2022-05-24 ENCOUNTER — Telehealth: Payer: Self-pay

## 2022-05-24 ENCOUNTER — Ambulatory Visit: Payer: Self-pay

## 2022-05-24 NOTE — Chronic Care Management (AMB) (Signed)
Duplicate: Please Disregard

## 2022-05-24 NOTE — Telephone Encounter (Signed)
  Care Management   Outreach Note  05/24/2022 Name: Theresa [name] MRN: [id number] DOB: [birth year]  An unsuccessful telephone outreach was attempted today. The patient was referred to the case management team for assistance with care management and care coordination.   Follow Up Plan:  A HIPAA compliant phone message was left for the patient providing contact information and requesting a return call.   [place] [facility] [phone]

## 2022-05-24 NOTE — Chronic Care Management (AMB) (Signed)
  Care Coordination  Outreach Note  05/24/2022 Name: Treana Lacour MRN: [id number] DOB: 17-Aug-1956   Care Coordination Outreach Attempts  An unsuccessful telephone outreach was attempted today to offer the patient information about available care coordination services as a benefit of their health plan.   Follow Up Plan:  Additional outreach attempts will be made to offer the patient care coordination information and services.   Encounter Outcome:  No Answer  Julian Hy, [place] Direct Dial: [phone]

## 2022-05-25 DIAGNOSIS — I509 Heart failure, unspecified: Secondary | ICD-10-CM | POA: Diagnosis not present

## 2022-05-30 ENCOUNTER — Other Ambulatory Visit: Payer: Self-pay | Admitting: Family

## 2022-05-30 MED ORDER — METOLAZONE 2.5 MG PO TABS: 2.5000 mg | ORAL_TABLET | ORAL | 3 refills | Status: DC

## 2022-05-30 MED ORDER — EMPAGLIFLOZIN 10 MG PO TABS: 10.0000 mg | ORAL_TABLET | Freq: Every day | ORAL | 5 refills | Status: DC

## 2022-05-30 NOTE — Progress Notes (Signed)
Metolazone and jardiance added per [place] provider

## 2022-05-31 DIAGNOSIS — I509 Heart failure, unspecified: Secondary | ICD-10-CM | POA: Diagnosis not present

## 2022-06-01 DIAGNOSIS — J9611 Chronic respiratory failure with hypoxia: Secondary | ICD-10-CM | POA: Diagnosis not present

## 2022-06-01 DIAGNOSIS — F419 Anxiety disorder, unspecified: Secondary | ICD-10-CM | POA: Diagnosis not present

## 2022-06-01 DIAGNOSIS — R69 Illness, unspecified: Secondary | ICD-10-CM | POA: Diagnosis not present

## 2022-06-01 DIAGNOSIS — I509 Heart failure, unspecified: Secondary | ICD-10-CM | POA: Diagnosis not present

## 2022-06-01 DIAGNOSIS — F17200 Nicotine dependence, unspecified, uncomplicated: Secondary | ICD-10-CM | POA: Diagnosis not present

## 2022-06-01 DIAGNOSIS — E261 Secondary hyperaldosteronism: Secondary | ICD-10-CM | POA: Diagnosis not present

## 2022-06-01 DIAGNOSIS — D6869 Other thrombophilia: Secondary | ICD-10-CM | POA: Diagnosis not present

## 2022-06-01 DIAGNOSIS — E876 Hypokalemia: Secondary | ICD-10-CM | POA: Diagnosis not present

## 2022-06-01 DIAGNOSIS — J439 Emphysema, unspecified: Secondary | ICD-10-CM | POA: Diagnosis not present

## 2022-06-01 DIAGNOSIS — I272 Pulmonary hypertension, unspecified: Secondary | ICD-10-CM | POA: Diagnosis not present

## 2022-06-01 DIAGNOSIS — I4891 Unspecified atrial fibrillation: Secondary | ICD-10-CM | POA: Diagnosis not present

## 2022-06-03 DIAGNOSIS — I509 Heart failure, unspecified: Secondary | ICD-10-CM | POA: Diagnosis not present

## 2022-06-09 ENCOUNTER — Telehealth: Payer: Self-pay | Admitting: Family

## 2022-06-09 DIAGNOSIS — I509 Heart failure, unspecified: Secondary | ICD-10-CM | POA: Diagnosis not present

## 2022-06-09 NOTE — Telephone Encounter (Signed)
Lab results obtained from V[facility] HF program dated 06/04/22:  Sodium 135 Potassium 2.7 Creatinine 0.97 BUN 25 GFR 65

## 2022-06-13 ENCOUNTER — Other Ambulatory Visit: Payer: Self-pay | Admitting: Internal Medicine

## 2022-06-17 DIAGNOSIS — I509 Heart failure, unspecified: Secondary | ICD-10-CM | POA: Diagnosis not present

## 2022-06-19 ENCOUNTER — Emergency Department: Payer: Medicare HMO

## 2022-06-19 ENCOUNTER — Encounter: Payer: Self-pay | Admitting: Emergency Medicine

## 2022-06-19 ENCOUNTER — Other Ambulatory Visit: Payer: Self-pay

## 2022-06-19 ENCOUNTER — Inpatient Hospital Stay
Admission: EM | Admit: 2022-06-19 | Discharge: 2022-06-22 | DRG: 378 | Disposition: A | Discharge status: Home / Self Care | Payer: Medicare HMO | Attending: Internal Medicine | Admitting: Internal Medicine

## 2022-06-19 DIAGNOSIS — Z7901 Long term (current) use of anticoagulants: Secondary | ICD-10-CM

## 2022-06-19 DIAGNOSIS — J449 Chronic obstructive pulmonary disease, unspecified: Secondary | ICD-10-CM | POA: Diagnosis present

## 2022-06-19 DIAGNOSIS — E039 Hypothyroidism, unspecified: Secondary | ICD-10-CM | POA: Diagnosis present

## 2022-06-19 DIAGNOSIS — D62 Acute posthemorrhagic anemia: Secondary | ICD-10-CM

## 2022-06-19 DIAGNOSIS — K219 Gastro-esophageal reflux disease without esophagitis: Secondary | ICD-10-CM | POA: Diagnosis not present

## 2022-06-19 DIAGNOSIS — Z87442 Personal history of urinary calculi: Secondary | ICD-10-CM

## 2022-06-19 DIAGNOSIS — Z8616 Personal history of COVID-19: Secondary | ICD-10-CM | POA: Diagnosis not present

## 2022-06-19 DIAGNOSIS — R1013 Epigastric pain: Secondary | ICD-10-CM | POA: Diagnosis not present

## 2022-06-19 DIAGNOSIS — I5032 Chronic diastolic (congestive) heart failure: Secondary | ICD-10-CM | POA: Diagnosis present

## 2022-06-19 DIAGNOSIS — Z79891 Long term (current) use of opiate analgesic: Secondary | ICD-10-CM

## 2022-06-19 DIAGNOSIS — D649 Anemia, unspecified: Secondary | ICD-10-CM

## 2022-06-19 DIAGNOSIS — Z7982 Long term (current) use of aspirin: Secondary | ICD-10-CM

## 2022-06-19 DIAGNOSIS — M19011 Primary osteoarthritis, right shoulder: Secondary | ICD-10-CM | POA: Diagnosis not present

## 2022-06-19 DIAGNOSIS — K264 Chronic or unspecified duodenal ulcer with hemorrhage: Secondary | ICD-10-CM | POA: Diagnosis not present

## 2022-06-19 DIAGNOSIS — I480 Paroxysmal atrial fibrillation: Secondary | ICD-10-CM | POA: Diagnosis present

## 2022-06-19 DIAGNOSIS — N200 Calculus of kidney: Secondary | ICD-10-CM | POA: Diagnosis not present

## 2022-06-19 DIAGNOSIS — J9611 Chronic respiratory failure with hypoxia: Secondary | ICD-10-CM | POA: Diagnosis not present

## 2022-06-19 DIAGNOSIS — K2971 Gastritis, unspecified, with bleeding: Principal | ICD-10-CM | POA: Diagnosis present

## 2022-06-19 DIAGNOSIS — K3189 Other diseases of stomach and duodenum: Secondary | ICD-10-CM | POA: Diagnosis not present

## 2022-06-19 DIAGNOSIS — E669 Obesity, unspecified: Secondary | ICD-10-CM | POA: Diagnosis not present

## 2022-06-19 DIAGNOSIS — Z90710 Acquired absence of both cervix and uterus: Secondary | ICD-10-CM

## 2022-06-19 DIAGNOSIS — E876 Hypokalemia: Secondary | ICD-10-CM | POA: Diagnosis present

## 2022-06-19 DIAGNOSIS — G894 Chronic pain syndrome: Secondary | ICD-10-CM | POA: Diagnosis not present

## 2022-06-19 DIAGNOSIS — F17200 Nicotine dependence, unspecified, uncomplicated: Secondary | ICD-10-CM

## 2022-06-19 DIAGNOSIS — I509 Heart failure, unspecified: Secondary | ICD-10-CM

## 2022-06-19 DIAGNOSIS — K922 Gastrointestinal hemorrhage, unspecified: Secondary | ICD-10-CM | POA: Diagnosis not present

## 2022-06-19 DIAGNOSIS — Z888 Allergy status to other drugs, medicaments and biological substances status: Secondary | ICD-10-CM

## 2022-06-19 DIAGNOSIS — Z6835 Body mass index (BMI) 35.0-35.9, adult: Secondary | ICD-10-CM | POA: Diagnosis not present

## 2022-06-19 DIAGNOSIS — R0602 Shortness of breath: Secondary | ICD-10-CM | POA: Diagnosis not present

## 2022-06-19 DIAGNOSIS — I110 Hypertensive heart disease with heart failure: Secondary | ICD-10-CM | POA: Diagnosis not present

## 2022-06-19 DIAGNOSIS — I10 Essential (primary) hypertension: Secondary | ICD-10-CM | POA: Diagnosis present

## 2022-06-19 DIAGNOSIS — F17210 Nicotine dependence, cigarettes, uncomplicated: Secondary | ICD-10-CM | POA: Diagnosis present

## 2022-06-19 DIAGNOSIS — M549 Dorsalgia, unspecified: Secondary | ICD-10-CM | POA: Diagnosis present

## 2022-06-19 DIAGNOSIS — Z811 Family history of alcohol abuse and dependence: Secondary | ICD-10-CM

## 2022-06-19 DIAGNOSIS — Z823 Family history of stroke: Secondary | ICD-10-CM

## 2022-06-19 DIAGNOSIS — Z803 Family history of malignant neoplasm of breast: Secondary | ICD-10-CM

## 2022-06-19 DIAGNOSIS — Z8249 Family history of ischemic heart disease and other diseases of the circulatory system: Secondary | ICD-10-CM

## 2022-06-19 DIAGNOSIS — K269 Duodenal ulcer, unspecified as acute or chronic, without hemorrhage or perforation: Secondary | ICD-10-CM | POA: Diagnosis not present

## 2022-06-19 DIAGNOSIS — M1610 Unilateral primary osteoarthritis, unspecified hip: Secondary | ICD-10-CM | POA: Diagnosis not present

## 2022-06-19 DIAGNOSIS — Z79899 Other long term (current) drug therapy: Secondary | ICD-10-CM

## 2022-06-19 DIAGNOSIS — E785 Hyperlipidemia, unspecified: Secondary | ICD-10-CM | POA: Diagnosis present

## 2022-06-19 DIAGNOSIS — F411 Generalized anxiety disorder: Secondary | ICD-10-CM | POA: Diagnosis present

## 2022-06-19 DIAGNOSIS — I4891 Unspecified atrial fibrillation: Secondary | ICD-10-CM | POA: Diagnosis present

## 2022-06-19 DIAGNOSIS — G4733 Obstructive sleep apnea (adult) (pediatric): Secondary | ICD-10-CM | POA: Diagnosis not present

## 2022-06-19 DIAGNOSIS — Z801 Family history of malignant neoplasm of trachea, bronchus and lung: Secondary | ICD-10-CM

## 2022-06-19 DIAGNOSIS — F32A Depression, unspecified: Secondary | ICD-10-CM | POA: Diagnosis present

## 2022-06-19 DIAGNOSIS — I2721 Secondary pulmonary arterial hypertension: Secondary | ICD-10-CM | POA: Diagnosis present

## 2022-06-19 DIAGNOSIS — Z9981 Dependence on supplemental oxygen: Secondary | ICD-10-CM

## 2022-06-19 DIAGNOSIS — Z885 Allergy status to narcotic agent status: Secondary | ICD-10-CM

## 2022-06-19 DIAGNOSIS — Z79890 Hormone replacement therapy (postmenopausal): Secondary | ICD-10-CM

## 2022-06-19 DIAGNOSIS — K2960 Other gastritis without bleeding: Secondary | ICD-10-CM | POA: Diagnosis not present

## 2022-06-19 LAB — COMPREHENSIVE METABOLIC PANEL
ALT: 18 U/L (ref 0–44)
AST: 21 U/L (ref 15–41)
Albumin: 3.9 g/dL (ref 3.5–5.0)
Alkaline Phosphatase: 73 U/L (ref 38–126)
Anion gap: 10 (ref 5–15)
BUN: 22 mg/dL (ref 8–23)
CO2: 32 mmol/L (ref 22–32)
Calcium: 9.3 mg/dL (ref 8.9–10.3)
Chloride: 91 mmol/L — ABNORMAL LOW (ref 98–111)
Creatinine, Ser: 0.91 mg/dL (ref 0.44–1.00)
GFR, Estimated: >60 mL/min (ref >60)
Glucose, Bld: 99 mg/dL (ref 70–99)
Potassium: 2.9 mmol/L — ABNORMAL LOW (ref 3.5–5.1)
Sodium: 133 mmol/L — ABNORMAL LOW (ref 135–145)
Total Bilirubin: 0.6 mg/dL (ref 0.3–1.2)
Total Protein: 7.3 g/dL (ref 6.5–8.1)

## 2022-06-19 LAB — CBC
HCT: 26 % — ABNORMAL LOW (ref 36.0–46.0)
Hemoglobin: 7.4 g/dL — ABNORMAL LOW (ref 12.0–15.0)
MCH: 24.5 pg — ABNORMAL LOW (ref 26.0–34.0)
MCHC: 28.5 g/dL — ABNORMAL LOW (ref 30.0–36.0)
MCV: 86.1 fL (ref 80.0–100.0)
Platelets: 306 10*3/uL (ref 150–400)
RBC: 3.02 MIL/uL — ABNORMAL LOW (ref 3.87–5.11)
RDW: 18.2 % — ABNORMAL HIGH (ref 11.5–15.5)
WBC: 9.8 10*3/uL (ref 4.0–10.5)
nRBC: 0.2 % (ref 0.0–0.2)

## 2022-06-19 LAB — URINALYSIS, ROUTINE W REFLEX MICROSCOPIC
Bilirubin Urine: NEGATIVE
Glucose, UA: >=500 mg/dL — AB
Hgb urine dipstick: NEGATIVE
Ketones, ur: NEGATIVE mg/dL
Leukocytes,Ua: NEGATIVE
Nitrite: NEGATIVE
Protein, ur: NEGATIVE mg/dL
Specific Gravity, Urine: 1.003 — ABNORMAL LOW (ref 1.005–1.030)
pH: 7 (ref 5.0–8.0)

## 2022-06-19 LAB — IRON AND TIBC
Iron: 17 ug/dL — ABNORMAL LOW (ref 28–170)
Saturation Ratios: 3 % — ABNORMAL LOW (ref 10.4–31.8)
TIBC: 514 ug/dL — ABNORMAL HIGH (ref 250–450)
UIBC: 497 ug/dL

## 2022-06-19 LAB — MAGNESIUM: Magnesium: 2.7 mg/dL — ABNORMAL HIGH (ref 1.7–2.4)

## 2022-06-19 LAB — TROPONIN I (HIGH SENSITIVITY): Troponin I (High Sensitivity): 7 ng/L (ref <18)

## 2022-06-19 LAB — PREPARE RBC (CROSSMATCH)

## 2022-06-19 LAB — HEMOGLOBIN AND HEMATOCRIT, BLOOD
HCT: 26.2 % — ABNORMAL LOW (ref 36.0–46.0)
Hemoglobin: 7.6 g/dL — ABNORMAL LOW (ref 12.0–15.0)

## 2022-06-19 LAB — BRAIN NATRIURETIC PEPTIDE: B Natriuretic Peptide: 51.6 pg/mL (ref 0.0–100.0)

## 2022-06-19 LAB — LIPASE, BLOOD: Lipase: 57 U/L — ABNORMAL HIGH (ref 11–51)

## 2022-06-19 LAB — PROTIME-INR
INR: 1 (ref 0.8–1.2)
Prothrombin Time: 12.9 seconds (ref 11.4–15.2)

## 2022-06-19 MED ORDER — FLUTICASONE-UMECLIDIN-VILANT 100-62.5-25 MCG/INH IN AEPB
1.0000 | INHALATION_SPRAY | Freq: Every day | RESPIRATORY_TRACT | Status: DC
Start: 2022-06-19 — End: 2022-06-19

## 2022-06-19 MED ORDER — POTASSIUM CHLORIDE 10 MEQ/100ML IV SOLN
10.0000 meq | Freq: Once | INTRAVENOUS | Status: AC
  Administered 2022-06-19: 10 meq via INTRAVENOUS
  Filled 2022-06-19: qty 100

## 2022-06-19 MED ORDER — BUSPIRONE HCL 10 MG PO TABS
5.0000 mg | ORAL_TABLET | Freq: Two times a day (BID) | ORAL | Status: DC
  Administered 2022-06-19 – 2022-06-22 (×6): 5 mg via ORAL
  Filled 2022-06-19 (×6): qty 1

## 2022-06-19 MED ORDER — BUTALBITAL-APAP-CAFFEINE 50-325-40 MG PO TABS
1.0000 | ORAL_TABLET | Freq: Four times a day (QID) | ORAL | Status: DC | PRN
Start: 2022-06-19 — End: 2022-06-22

## 2022-06-19 MED ORDER — SERTRALINE HCL 50 MG PO TABS
100.0000 mg | ORAL_TABLET | Freq: Every day | ORAL | Status: DC
  Administered 2022-06-19 – 2022-06-21 (×3): 100 mg via ORAL
  Filled 2022-06-19 (×3): qty 2

## 2022-06-19 MED ORDER — ONDANSETRON HCL 4 MG/2ML IJ SOLN
4.0000 mg | Freq: Four times a day (QID) | INTRAMUSCULAR | Status: DC | PRN
  Administered 2022-06-20 (×2): 4 mg via INTRAVENOUS
  Filled 2022-06-19 (×2): qty 2

## 2022-06-19 MED ORDER — POTASSIUM CHLORIDE CRYS ER 20 MEQ PO TBCR
40.0000 meq | EXTENDED_RELEASE_TABLET | Freq: Two times a day (BID) | ORAL | Status: AC
  Administered 2022-06-19 (×2): 40 meq via ORAL
  Filled 2022-06-19 (×2): qty 2

## 2022-06-19 MED ORDER — FENTANYL CITRATE PF 50 MCG/ML IJ SOSY
50.0000 ug | PREFILLED_SYRINGE | Freq: Once | INTRAMUSCULAR | Status: AC
  Administered 2022-06-19: 50 ug via INTRAVENOUS
  Filled 2022-06-19: qty 1

## 2022-06-19 MED ORDER — LEVOTHYROXINE SODIUM 25 MCG PO TABS
25.0000 ug | ORAL_TABLET | Freq: Every day | ORAL | Status: DC
  Administered 2022-06-20 – 2022-06-22 (×2): 25 ug via ORAL
  Filled 2022-06-19 (×3): qty 1

## 2022-06-19 MED ORDER — OXYCODONE HCL 5 MG PO TABS
5.0000 mg | ORAL_TABLET | ORAL | Status: AC | PRN
  Administered 2022-06-19 – 2022-06-21 (×3): 5 mg via ORAL
  Filled 2022-06-19 (×4): qty 1

## 2022-06-19 MED ORDER — OXYCODONE HCL 5 MG PO TABS: 5.0000 mg | ORAL_TABLET | ORAL | Status: DC | PRN

## 2022-06-19 MED ORDER — FUROSEMIDE 10 MG/ML IJ SOLN
20.0000 mg | Freq: Once | INTRAMUSCULAR | Status: AC
Start: 2022-06-19 — End: 2022-06-19
  Administered 2022-06-19: 20 mg via INTRAVENOUS
  Filled 2022-06-19: qty 4

## 2022-06-19 MED ORDER — FLUTICASONE FUROATE-VILANTEROL 100-25 MCG/ACT IN AEPB: 1.0000 | INHALATION_SPRAY | Freq: Every day | RESPIRATORY_TRACT | Status: DC

## 2022-06-19 MED ORDER — UMECLIDINIUM BROMIDE 62.5 MCG/ACT IN AEPB: 1.0000 | INHALATION_SPRAY | Freq: Every day | RESPIRATORY_TRACT | Status: DC

## 2022-06-19 MED ORDER — AMIODARONE HCL 200 MG PO TABS
200.0000 mg | ORAL_TABLET | Freq: Every day | ORAL | Status: DC
  Administered 2022-06-20 – 2022-06-22 (×3): 200 mg via ORAL
  Filled 2022-06-19 (×3): qty 1

## 2022-06-19 MED ORDER — ROFLUMILAST 500 MCG PO TABS
500.0000 ug | ORAL_TABLET | Freq: Every day | ORAL | Status: DC
  Administered 2022-06-20 – 2022-06-22 (×3): 500 ug via ORAL
  Filled 2022-06-19 (×3): qty 1

## 2022-06-19 MED ORDER — POTASSIUM CHLORIDE CRYS ER 20 MEQ PO TBCR
20.0000 meq | EXTENDED_RELEASE_TABLET | Freq: Two times a day (BID) | ORAL | Status: DC
  Administered 2022-06-20 – 2022-06-22 (×5): 20 meq via ORAL
  Filled 2022-06-19 (×5): qty 1

## 2022-06-19 MED ORDER — IPRATROPIUM-ALBUTEROL 0.5-2.5 (3) MG/3ML IN SOLN
3.0000 mL | Freq: Three times a day (TID) | RESPIRATORY_TRACT | Status: DC
Start: 2022-06-19 — End: 2022-06-22
  Administered 2022-06-19 – 2022-06-22 (×8): 3 mL via RESPIRATORY_TRACT
  Filled 2022-06-19 (×8): qty 3

## 2022-06-19 MED ORDER — IPRATROPIUM-ALBUTEROL 0.5-2.5 (3) MG/3ML IN SOLN
3.0000 mL | Freq: Once | RESPIRATORY_TRACT | Status: AC
  Administered 2022-06-19: 3 mL via RESPIRATORY_TRACT
  Filled 2022-06-19: qty 3

## 2022-06-19 MED ORDER — PANTOPRAZOLE SODIUM 40 MG IV SOLR
40.0000 mg | Freq: Two times a day (BID) | INTRAVENOUS | Status: DC
  Administered 2022-06-19 – 2022-06-20 (×3): 40 mg via INTRAVENOUS
  Filled 2022-06-19 (×3): qty 10

## 2022-06-19 MED ORDER — SODIUM CHLORIDE 0.9% IV SOLUTION
Freq: Once | INTRAVENOUS | Status: AC
  Filled 2022-06-19: qty 250

## 2022-06-19 MED ORDER — ONDANSETRON HCL 4 MG PO TABS: 4.0000 mg | ORAL_TABLET | Freq: Four times a day (QID) | ORAL | Status: DC | PRN

## 2022-06-19 MED ORDER — HYDROMORPHONE HCL 1 MG/ML IJ SOLN
0.5000 mg | INTRAMUSCULAR | Status: DC | PRN
  Administered 2022-06-20 – 2022-06-22 (×4): 0.5 mg via INTRAVENOUS
  Filled 2022-06-19 (×3): qty 1
  Filled 2022-06-19: qty 0.5

## 2022-06-19 MED ORDER — PANTOPRAZOLE SODIUM 40 MG IV SOLR
40.0000 mg | Freq: Once | INTRAVENOUS | Status: AC
  Administered 2022-06-19: 40 mg via INTRAVENOUS
  Filled 2022-06-19: qty 10

## 2022-06-19 MED ORDER — SIMVASTATIN 20 MG PO TABS
20.0000 mg | ORAL_TABLET | Freq: Every day | ORAL | Status: DC
  Administered 2022-06-19 – 2022-06-21 (×3): 20 mg via ORAL
  Filled 2022-06-19: qty 1
  Filled 2022-06-19: qty 2
  Filled 2022-06-19: qty 1

## 2022-06-19 MED ORDER — ACETAMINOPHEN 500 MG PO TABS
1000.0000 mg | ORAL_TABLET | Freq: Three times a day (TID) | ORAL | Status: DC | PRN
  Administered 2022-06-20 – 2022-06-21 (×2): 1000 mg via ORAL
  Filled 2022-06-19 (×2): qty 2

## 2022-06-19 MED ORDER — ONDANSETRON HCL 4 MG/2ML IJ SOLN
4.0000 mg | Freq: Once | INTRAMUSCULAR | Status: AC
  Administered 2022-06-19: 4 mg via INTRAVENOUS
  Filled 2022-06-19: qty 2

## 2022-06-19 MED ORDER — IOHEXOL 300 MG/ML  SOLN
100.0000 mL | Freq: Once | INTRAMUSCULAR | Status: AC | PRN
Start: 2022-06-19 — End: 2022-06-19
  Administered 2022-06-19: 100 mL via INTRAVENOUS

## 2022-06-19 NOTE — Assessment & Plan Note (Signed)
Hold Letairis since patient is normotensive

## 2022-06-19 NOTE — Assessment & Plan Note (Signed)
Patient has a BMI of 45.8 Complicates overall prognosis and care Lifestyle modification and exercise has been discussed with patient in detail

## 2022-06-19 NOTE — Assessment & Plan Note (Signed)
Stable Continue Synthroid 

## 2022-06-19 NOTE — ED Notes (Signed)
Report given to [employer] and pt  moved to room 37 while waiting for admission.

## 2022-06-19 NOTE — Assessment & Plan Note (Signed)
Chronic diastolic dysfunction CHF Stable and not acutely exacerbated Last known LVEF of 60 to 65% from a 2D echocardiogram which was done 07/23 Hold metolazone and torsemide for now since patient is normotensive Hold metoprolol for now

## 2022-06-19 NOTE — Assessment & Plan Note (Signed)
Hold Cardizem and metoprolol for now since patient is normotensive

## 2022-06-19 NOTE — ED Triage Notes (Signed)
Pt reports is always on 2.5L of oxygen and over the last few days she has been more short of breath. Pt also reports some abd discomfort and states she feels like it is swollen. Pt reports abd pain started a couple of weeks ago. Pt states has tried OTC meds with no relief.

## 2022-06-19 NOTE — Assessment & Plan Note (Signed)
Smoking cessation has been discussed with patient in detail According to the patient she is down to 10 cigarettes daily and is gradually cutting back She declines a nicotine transdermal patch at this time

## 2022-06-19 NOTE — Assessment & Plan Note (Signed)
Patient has a history of COPD with chronic respiratory failure and is on 2.5 L of oxygen continuous. Maintain pulse oximetry greater than 92%

## 2022-06-19 NOTE — Assessment & Plan Note (Signed)
Continue amiodarone for rate control Hold metoprolol and Cardizem since patient is normotensive Hold apixaban and aspirin due to acute blood loss anemia

## 2022-06-19 NOTE — Progress Notes (Signed)
CROSS COVER NOTE  NAME: Theresa [name] MRN: [id number] DOB : Aug 21, 1956    Date of Service   06/19/2022   HPI/Events of Note   Medication request received for 7/10 abdominal pain.  M(r)s Theresa [name] is a 66 year old female with PMH of chronic diastolic dysfunction CHF, pulmonary hypertension, COPD on 2.5 L of oxygen chronically, GERD, hypothyroidism, depression, nicotine dependence, obstructive sleep apnea, chronic pain syndrome, obesity who presented to [facility] ED with reports of worsening shortness of breath with exertion and abdominal pain.  Patient reports that abdominal pain started about 2 weeks ago and reports it is 8 out of 10 in intensity at its worse associated with nausea and coffee-ground emesis prior to admission.  Tenderness epigastric, periumbilical  Interventions   Plan: Oxycodone PRN  Dilaudid PRN Home Bipap qHS Consider repeat imaging    This document was prepared using Dragon voice recognition software and may include unintentional dictation errors.  Neomia Glass DNP, MHA, FNP-BC Nurse Practitioner [facility] Pager [phone]

## 2022-06-19 NOTE — ED Notes (Signed)
Report received and care assumed.   Visited with pt who c/o increased abd pain, provider notified.

## 2022-06-19 NOTE — H&P (Signed)
History and Physical    Patient: Theresa [name] TKP:[id number] DOB: Nov 21, 1955 DOA: 06/19/2022 DOS: the patient was seen and examined on 06/19/2022 PCP: Birdie Sons, MD  Patient coming from: Home  Chief Complaint:  Chief Complaint  Patient presents with   Abdominal Pain   Shortness of Breath   HPI: Theresa [name] is a 66 y.o. female with medical history significant for chronic diastolic dysfunction CHF, pulmonary hypertension, COPD on 2.5 L of oxygen chronically, GERD, hypothyroidism, depression, nicotine dependence, obstructive sleep apnea, chronic pain syndrome, obesity who presents to the emergency room for evaluation of worsening shortness of breath mostly with exertion over the last several days as well as abdominal pain. Patient states that she developed abdominal pain about 2 weeks ago pain is mostly in the epigastrium and periumbilical area.  She rates it an 8 x 10 in intensity at its worst and it is associated with nausea and an episode of coffee ground emesis 2 days prior to her admission.  She feels like her abdomen is distended.  She denies having any hematochezia or passage of any melena stools.  She denies NSAID use. She notes shortness of breath mostly with exertion but denies having any chest pain, no cough, no fever, no chills, no leg swelling, no dizziness, no lightheadedness or any falls. Labs show a drop in her hemoglobin from 13.5 >> 7.4 over the last 1 month.  She also has hypokalemia. She will be admitted to the hospital for evaluation of acute blood loss anemia.   Review of Systems: As mentioned in the history of present illness. All other systems reviewed and are negative. Past Medical History:  Diagnosis Date   Acute postoperative pain 10/03/2017   Anxiety    BiPAP (biphasic positive airway pressure) dependence    at hs   Carpal tunnel syndrome    CHF (congestive heart failure) ([place])    1/18   CHF (congestive heart failure) (HCC)    Chronic generalized  abdominal pain    Chronic rhinitis    COPD (chronic obstructive pulmonary disease) (HCC)    DDD (degenerative disc disease), cervical    DDD (degenerative disc disease), lumbosacral    Depression    Dyspnea    Edema    Flu    1/18   Gastritis    GERD (gastroesophageal reflux disease)    Hematuria    Hernia of abdominal wall 07/17/2018   Hernia, abdominal    Hypertension    Kidney stones    Low back pain    Lumbar radiculitis    Malodorous urine    Muscle weakness    Obesity    Oxygen dependent    Pneumonia due to COVID-19 virus 02/03/2020   Renal cyst    Sensory urge incontinence    Thyroid activity decreased    9/19   Tobacco abuse    Wheezing    Past Surgical History:  Procedure Laterality Date   ABDOMINAL HYSTERECTOMY     CARDIOVERSION N/A 04/29/2022   Procedure: CARDIOVERSION;  Surgeon: Corey Skains, MD;  Location: [facility] ORS;  Service: Cardiovascular;  Laterality: N/A;   CARPAL TUNNEL RELEASE Left 2012   EPIGASTRIC HERNIA REPAIR N/A 08/13/2018   HERNIA REPAIR EPIGASTRIC ADULT; polypropylene mesh reinforcement.  Surgeon: Robert Bellow, MD;  Location: [facility] ORS;  Service: General;  Laterality: N/A;   RIGHT/LEFT HEART CATH AND CORONARY ANGIOGRAPHY N/A 07/11/2019   Procedure: RIGHT/LEFT HEART CATH AND CORONARY ANGIOGRAPHY;  Surgeon: Yolonda Kida, MD;  Location:  [facility];  Service: Cardiovascular;  Laterality: N/A;   TEE WITHOUT CARDIOVERSION N/A 04/29/2022   Procedure: TRANSESOPHAGEAL ECHOCARDIOGRAM (TEE);  Surgeon: Corey Skains, MD;  Location: [facility] ORS;  Service: Cardiovascular;  Laterality: N/A;   TONSILLECTOMY     TUBAL LIGATION     UMBILICAL HERNIA REPAIR N/A 08/13/2018   HERNIA REPAIR UMBILICAL ADULT; polypropylene mesh reinforcement Surgeon: Robert Bellow, MD;  Location: [facility] ORS;  Service: General;  Laterality: N/A;   Social History:  reports that she has been smoking cigarettes. She has a 22.00 pack-year smoking history. She has  never used smokeless tobacco. She reports current drug use. She reports that she does not drink alcohol.  Allergies  Allergen Reactions   Gabapentin Other (See Comments)    Higher doses of 800 mg, 600 mg, 300 mg causes excessive sedation and contributes to numerous mechanical falls. (Unsure if 100 mg dose can be tolerated)   Codeine Nausea And Vomiting   Meloxicam Other (See Comments)    Stomach pain    Family History  Problem Relation Age of Onset   Heart disease Mother    Stroke Mother    Coronary artery disease Mother    Lung cancer Sister    Cancer Sister    Breast cancer Sister    Alcohol abuse Father    Heart disease Father    Cancer Brother    Cancer Brother    Pneumonia Brother    Prostate cancer Neg Hx    Kidney cancer Neg Hx    Bladder Cancer Neg Hx     Prior to Admission medications   Medication Sig Start Date End Date Taking? Authorizing Provider  acetaminophen (TYLENOL) 500 MG tablet Take 1,000 mg by mouth every 8 (eight) hours as needed.    [provider]  albuterol (VENTOLIN HFA) 108 (90 Base) MCG/ACT inhaler Inhale 2 puffs into the lungs every 6 (six) hours as needed for wheezing or shortness of breath. 10/23/19   Rudene Re, MD  ambrisentan (LETAIRIS) 5 MG tablet Take 5 mg by mouth daily. 01/13/22   [provider]  amiodarone (PACERONE) 200 MG tablet Take 1 tablet (200 mg total) by mouth daily. 04/30/22   Sharen Hones, MD  aspirin EC 81 MG tablet Take 81 mg by mouth daily.    [provider]  Besifloxacin HCl (BESIVANCE) 0.6 % SUSP Place 1 drop into the right eye 3 (three) times daily. For 2 weeks starting 03/29/22 Patient not taking: Reported on 05/18/2022    [provider]  Bromfenac Sodium (PROLENSA) 0.07 % SOLN Place 1 drop into the right eye at bedtime. For 4 weeks starting 03/29/22 Patient not taking: Reported on 05/18/2022    [provider]  busPIRone (BUSPAR) 5 MG tablet Take 1 tablet (5 mg total) by  mouth 2 (two) times daily. 05/05/22   [name], FNP  butalbital-acetaminophen-caffeine (FIORICET) [phone] MG tablet Take 1 tablet by mouth every 6 (six) hours as needed for headache. LIMIT USE TO PREVENT MEDICATION OVERUSE HEADACHE 04/09/22   Emeterio Reeve, DO  Cholecalciferol 50 MCG (2000 UT) CAPS Take 2,000 Units by mouth daily.     [provider]  dextromethorphan-guaiFENesin (MUCINEX DM) 30-600 MG 12hr tablet Take 1 tablet by mouth 2 (two) times daily as needed for cough. Patient not taking: Reported on 05/18/2022 04/09/22   Emeterio Reeve, DO  Difluprednate 0.05 % EMUL Place 1 drop into the right eye in the morning, at noon, in the  evening, and at bedtime. One drop 4 times daily for one week starting 03/29/22. One drop 3 times daily for one week. One drop 2 times daily for one week. One drop once daily for one week, then stop. Patient not taking: Reported on 05/18/2022    [provider]  diltiazem (CARDIZEM) 60 MG tablet Take 1 tablet (60 mg total) by mouth every 12 (twelve) hours. 04/29/22   Sharen Hones, MD  empagliflozin (JARDIANCE) 10 MG TABS tablet Take 1 tablet (10 mg total) by mouth daily before breakfast. 05/30/22   [name], FNP  Fluticasone-Umeclidin-Vilant 100-62.5-25 MCG/INH AEPB Inhale 1 puff into the lungs daily. 10/03/18   [provider]  HYDROcodone-acetaminophen (NORCO/VICODIN) 5-325 MG tablet Take 1 tablet by mouth every 8 (eight) hours as needed for severe pain. Must last 30 days 06/30/22 07/30/22  Milinda Pointer, MD  ipratropium-albuterol (DUONEB) 0.5-2.5 (3) MG/3ML SOLN USE 1 VIAL VIA NEBULIZER EVERY 6 HOURS AS NEEDED FOR SHORTNESS OF BREATH OR WHEEZING 11/30/20   Chrismon, Vickki Muff, PA-C  levothyroxine (SYNTHROID) 25 MCG tablet TAKE 1 TABLET(25 MCG) BY MOUTH DAILY BEFORE BREAKFAST Patient not taking: Reported on 05/18/2022 01/03/22   Birdie Sons, MD  metolazone (ZAROXOLYN) 2.5 MG tablet Take 1 tablet (2.5 mg total) by mouth 3  (three) times a week. On M, W, F 05/30/22   Darylene Price A, FNP  metoprolol succinate (TOPROL XL) 25 MG 24 hr tablet Take 1 tablet (25 mg total) by mouth daily. 05/11/22   [name], FNP  nicotine (NICODERM CQ - DOSED IN MG/24 HOURS) 21 mg/24hr patch Place 1 patch (21 mg total) onto the skin daily. Patient not taking: Reported on 05/18/2022 04/10/22   Emeterio Reeve, DO  OXYGEN Inhale 2.5 L/min into the lungs.    [provider]  potassium chloride SA (KLOR-CON M) 20 MEQ tablet Take 20-40 mEq by mouth 2 (two) times daily. Take 40 mEq in the morning and 20 mEq in the evening. 04/11/22   [provider]  rivaroxaban (XARELTO) 20 MG TABS tablet Take 1 tablet (20 mg total) by mouth daily with supper. 04/29/22   Sharen Hones, MD  roflumilast (DALIRESP) 500 MCG TABS tablet Take 500 mcg by mouth daily.    [provider]  sertraline (ZOLOFT) 100 MG tablet TAKE 2 TABLETS BY MOUTH ONCE EVERY MORNING 04/12/22   Birdie Sons, MD  simvastatin (ZOCOR) 20 MG tablet Take 20 mg by mouth at bedtime. 04/11/22   [provider]  torsemide (DEMADEX) 20 MG tablet Take 2 tablets (40 mg total) by mouth 2 (two) times daily. Take 4 tablets QAM and 2 tablets QPM Patient taking differently: Take 40-80 mg by mouth 2 (two) times daily. Take 4 tablets QAM and 2 tablets QPM 04/09/22   Emeterio Reeve, DO    Physical Exam: Vitals:   06/19/22 1209 06/19/22 1303 06/19/22 1330 06/19/22 1400  BP: (!) 119/45 (!) 111/56 (!) 108/54 (!) 110/59  Pulse: 73 80 80 78  Resp:      Temp:      TempSrc:      SpO2: 100% 95%  98%  Weight:      Height:       Physical Exam Vitals and nursing note reviewed.  Constitutional:      Appearance: She is well-developed.  HENT:     Head: Normocephalic and atraumatic.     Mouth/Throat:     Mouth: Mucous membranes are moist.  Cardiovascular:  Rate and Rhythm: Normal rate and regular rhythm.  Pulmonary:     Effort: Pulmonary effort is normal.      Breath sounds: Normal breath sounds.  Abdominal:     General: Abdomen is flat. Bowel sounds are normal.     Palpations: Abdomen is soft.     Tenderness: There is abdominal tenderness in the epigastric area and periumbilical area.  Skin:    General: Skin is warm and dry.  Neurological:     General: No focal deficit present.     Mental Status: She is alert.  Psychiatric:        Mood and Affect: Mood normal.        Behavior: Behavior normal.     Data Reviewed: Relevant notes from primary care and specialist visits, past discharge summaries as available in EHR, including Care Everywhere. Prior diagnostic testing as pertinent to current admission diagnoses Updated medications and problem lists for reconciliation ED course, including vitals, labs, imaging, treatment and response to treatment Triage notes, nursing and pharmacy notes and ED provider's notes Notable results as noted in HPI Labs reviewed.  Sodium 133, potassium 2.9, chloride 91, bicarb 32, glucose 99, BUN 22, creatinine 0.91, calcium 9.3, total protein 7.3, albumin 3.9, AST 21, ALT 18, alkaline phosphatase 73, total bilirubin 0.6, white count 9.8, hemoglobin 7.4 compared to baseline of 13.5, platelet count 206 Chest x-ray reviewed by me shows Mild interstitial opacities which could reflect mild edema or changes related to emphysema. CT scan of abdomen and pelvis with contrast show there is no evidence of intestinal obstruction or pneumoperitoneum. Appendix is not dilated. There is no hydronephrosis. There are few small bilateral renal stones. There is 1.4 cm cyst in the upper pole of right kidney. There is 8 mm hyperdense lesion, possibly hemorrhagic cyst in the medial right kidney. There are multiple low-density lesions in liver, possibly cysts or hemangiomas. Lumbar spondylosis. Twelve-lead EKG reviewed by me shows normal sinus rhythm.  Nonspecific ST and T wave changes There are no new results to review at this  time.  Assessment and Plan: * ABLA (acute blood loss anemia) Patient presents to the ER for evaluation of abdominal pain mostly in the epigastrium and periumbilical area associated with an episode of coffee-ground emesis and is noted to have a drop in her H&H from 13.5 >> 7.4 over the last 1 month. She complains of exertional shortness of breath which may be related to symptomatic anemia Concern for possible peptic ulcer disease Hold Xarelto and aspirin Place patient on Protonix 40 mg IV every 12 Consult gastroenterology Obtain iron studies We will transfuse 1 unit of packed RBC Check serial H&H  Essential (primary) hypertension Hold Cardizem and metoprolol for now since patient is normotensive  Pulmonary arterial hypertension ([place]) Hold Letairis since patient is normotensive  Hypothyroidism, unspecified Stable Continue Synthroid  Obesity (BMI 30-39.9) Patient has a BMI of 50.5 Complicates overall prognosis and care Lifestyle modification and exercise has been discussed with patient in detail  Paroxysmal atrial fibrillation (HCC) Continue amiodarone for rate control Hold metoprolol and Cardizem since patient is normotensive Hold apixaban and aspirin due to acute blood loss anemia  CHF (congestive heart failure) (HCC) Chronic diastolic dysfunction CHF Stable and not acutely exacerbated Last known LVEF of 60 to 65% from a 2D echocardiogram which was done 07/23 Hold metolazone and torsemide for now since patient is normotensive Hold metoprolol for now  Chronic respiratory failure with hypoxia [facility]) Patient has a history of COPD with chronic  respiratory failure and is on 2.5 L of oxygen continuous. Maintain pulse oximetry greater than 92%  Nicotine dependence Smoking cessation has been discussed with patient in detail According to the patient she is down to 10 cigarettes daily and is gradually cutting back She declines a nicotine transdermal patch at this time       Advance Care Planning:   Code Status: Full Code   Consults: Gastroenterology  Family Communication: Greater than 50% of time was spent discussing patient's condition and plan of care with her at the bedside.  All questions and concerns have been addressed.  She verbalizes understanding and agrees with the plan.  Severity of Illness: The appropriate patient status for this patient is INPATIENT. Inpatient status is judged to be reasonable and necessary in order to provide the required intensity of service to ensure the patient's safety. The patient's presenting symptoms, physical exam findings, and initial radiographic and laboratory data in the context of their chronic comorbidities is felt to place them at high risk for further clinical deterioration. Furthermore, it is not anticipated that the patient will be medically stable for discharge from the hospital within 2 midnights of admission.   * I certify that at the point of admission it is my clinical judgment that the patient will require inpatient hospital care spanning beyond 2 midnights from the point of admission due to high intensity of service, high risk for further deterioration and high frequency of surveillance required.*  Author: Collier Bullock, MD 06/19/2022 3:00 PM  For on call review [email or web address].

## 2022-06-19 NOTE — ED Provider Notes (Signed)
[facility] Provider Note    Event Date/Time   First MD Initiated Contact with Patient 06/19/22 1130     (approximate)   History   Abdominal Pain and Shortness of Breath   HPI  Theresa [name] is a 66 y.o. female with history of COPD on 2 L home O2, CHF, hypertension, hyperlipidemia, and GERD who presents with epigastric abdominal pain for the last 2 weeks, gradual onset, worsening course, and associated with nausea and vomiting.  She denies associated diarrhea or fever.  She states in the last several days she has had some shortness of breath, cough, and wheezing as well.  She denies chest pain.      Physical Exam   Triage Vital Signs: ED Triage Vitals  Enc Vitals Group     BP 06/19/22 1108 126/62     Pulse Rate 06/19/22 1108 90     Resp 06/19/22 1108 (!) 22     Temp 06/19/22 1108 98.3 F (36.8 C)     Temp Source 06/19/22 1108 Oral     SpO2 06/19/22 1108 97 %     Weight 06/19/22 1111 195 lb (88.5 kg)     Height 06/19/22 1111 _0  (1.575 m)     Head Circumference --      Peak Flow --      Pain Score 06/19/22 1111 8     Pain Loc --      Pain Edu? --      Excl. in [place]? --     Most recent vital signs: Vitals:   06/19/22 1108  BP: 126/62  Pulse: 90  Resp: (!) 22  Temp: 98.3 F (36.8 C)  SpO2: 97%     General: Alert and oriented, relatively well-appearing. CV:  Good peripheral perfusion.  Resp:  Normal effort.  Bilateral wheezing. Abd:  Soft with mild epigastric tenderness.  No peritoneal signs.  No distention.  Other:  No peripheral edema.   ED Results / Procedures / Treatments   Labs (all labs ordered are listed, but only abnormal results are displayed) Labs Reviewed  LIPASE, BLOOD - Abnormal; Notable for the following components:      Result Value   Lipase 57 (*)    All other components within normal limits  COMPREHENSIVE METABOLIC PANEL - Abnormal; Notable for the following components:   Sodium 133 (*)    Potassium 2.9  (*)    Chloride 91 (*)    All other components within normal limits  CBC - Abnormal; Notable for the following components:   RBC 3.02 (*)    Hemoglobin 7.4 (*)    HCT 26.0 (*)    MCH 24.5 (*)    MCHC 28.5 (*)    RDW 18.2 (*)    All other components within normal limits  URINALYSIS, ROUTINE W REFLEX MICROSCOPIC  BRAIN NATRIURETIC PEPTIDE  PROTIME-INR  TYPE AND SCREEN  TROPONIN I (HIGH SENSITIVITY)     EKG  ED ECG REPORT I, Arta Silence, the attending physician, personally viewed and interpreted this ECG.  Date: 06/19/2022 EKG Time: 1108 Rate: 89 Rhythm: normal sinus rhythm QRS Axis: normal Intervals: normal ST/T Wave abnormalities: Nonspecific ST abnormalities Narrative Interpretation: Nonspecific abnormalities with no evidence of acute ischemia    RADIOLOGY  Chest x-ray: I independently viewed and interpreted the images; there is no focal consolidation but there are mild bilateral interstitial opacities  CT abdomen/pelvis: No dilated bowel loops, free fluid, or perforation.  Radiology report indicates renal cysts  and nonspecific hepatic lesions with no acute findings.  PROCEDURES:  Critical Care performed: Yes, see critical care procedure note(s)  .Critical Care  Performed by: Arta Silence, MD Authorized by: Arta Silence, MD   Critical care provider statement:    Critical care time (minutes):  30   Critical care was necessary to treat or prevent imminent or life-threatening deterioration of the following conditions:  Circulatory failure   Critical care was time spent personally by me on the following activities:  Development of treatment plan with patient or surrogate, discussions with consultants, evaluation of patient's response to treatment, examination of patient, ordering and review of laboratory studies, ordering and review of radiographic studies, ordering and performing treatments and interventions, pulse oximetry, re-evaluation of  patient's condition, review of old charts and obtaining history from patient or surrogate   Care discussed with: admitting provider      MEDICATIONS ORDERED IN ED: Medications  ipratropium-albuterol (DUONEB) 0.5-2.5 (3) MG/3ML nebulizer solution 3 mL (3 mLs Nebulization Given 06/19/22 1205)  fentaNYL (SUBLIMAZE) injection 50 mcg (50 mcg Intravenous Given 06/19/22 1206)  ondansetron (ZOFRAN) injection 4 mg (4 mg Intravenous Given 06/19/22 1206)     IMPRESSION / MDM / [place] / ED COURSE  I reviewed the triage vital signs and the nursing notes.  66 year old female with PMH as noted above presents with subacute worsening epigastric abdominal pain along with acutely worsened shortness of breath and wheezing over the last several days.  I reviewed the past medical records.  The patient was admitted in light.  Per the hospitalist discharge summary from 04/29/2022, she presented with shortness of breath and fatigue and was found to be in atrial flutter with RVR which was treated with amiodarone.  She required high flow nasal cannula for respiratory failure.  On exam the patient is overall well-appearing and her vital signs are normal.  She has no O2 saturation in the high 90s on her normal 2.5 L of O2.  She has some mild epigastric tenderness.  Differential diagnosis includes, but is not limited to, gastritis, gastroenteritis, PUD, pancreatitis, cholecystitis or other hepatobiliary cause, gastroenteritis, colitis, diverticulitis.  Differential for the shortness of breath includes COPD or CHF exacerbations, pulmonary edema, pneumonia, acute bronchitis.  Patient's presentation is most consistent with acute presentation with potential threat to life or bodily function.  We will obtain chest x-ray, CT abdomen/pelvis, lab work-up, give bronchodilators and analgesia and reassess.  The patient is on the cardiac monitor to evaluate for evidence of arrhythmia and/or significant heart rate  changes.  ----------------------------------------- 1:47 PM on 06/19/2022 -----------------------------------------  Lab work-up reveals significant drop in the hemoglobin to 7.4 from 13.5 a month ago.  Labs are otherwise unremarkable except for mild hypokalemia.  The combination of epigastric pain and the hemoglobin drop concerns me for PUD or gastritis with possible upper GI bleed.  I performed a DRE.  The stool is brown but guaiac positive.  CT abdomen/pelvis shows no significant acute findings.  I ordered IV potassium as well as Protonix.  The patient is hemodynamically stable and does not require blood transfusion at this time but will need GI evaluation and serial hemoglobins.  I consulted Dr. Francine Graven from the hospitalist service; based on our discussion she agrees to admit the patient.   FINAL CLINICAL IMPRESSION(S) / ED DIAGNOSES   Final diagnoses:  None     Rx / DC Orders   ED Discharge Orders     None  Note:  This document was prepared using Dragon voice recognition software and may include unintentional dictation errors.    Arta Silence, MD 06/19/22 1349

## 2022-06-19 NOTE — Assessment & Plan Note (Addendum)
Patient presents to the ER for evaluation of abdominal pain mostly in the epigastrium and periumbilical area associated with an episode of coffee-ground emesis and is noted to have a drop in her H&H from 13.5 >> 7.4 over the last 1 month. She complains of exertional shortness of breath which may be related to symptomatic anemia Concern for possible peptic ulcer disease Hold Xarelto and aspirin Place patient on Protonix 40 mg IV every 12 Consult gastroenterology Obtain iron studies We will transfuse 1 unit of packed RBC Check serial H&H

## 2022-06-20 DIAGNOSIS — D62 Acute posthemorrhagic anemia: Secondary | ICD-10-CM | POA: Diagnosis not present

## 2022-06-20 LAB — HEMOGLOBIN AND HEMATOCRIT, BLOOD
HCT: 27.3 % — ABNORMAL LOW (ref 36.0–46.0)
HCT: 26.3 % — ABNORMAL LOW (ref 36.0–46.0)
Hemoglobin: 7.9 g/dL — ABNORMAL LOW (ref 12.0–15.0)
Hemoglobin: 7.6 g/dL — ABNORMAL LOW (ref 12.0–15.0)

## 2022-06-20 LAB — BASIC METABOLIC PANEL
Anion gap: 5 (ref 5–15)
BUN: 18 mg/dL (ref 8–23)
CO2: 32 mmol/L (ref 22–32)
Calcium: 9.4 mg/dL (ref 8.9–10.3)
Chloride: 101 mmol/L (ref 98–111)
Creatinine, Ser: 0.92 mg/dL (ref 0.44–1.00)
GFR, Estimated: >60 mL/min (ref >60)
Glucose, Bld: 102 mg/dL — ABNORMAL HIGH (ref 70–99)
Potassium: 4.2 mmol/L (ref 3.5–5.1)
Sodium: 138 mmol/L (ref 135–145)

## 2022-06-20 MED ORDER — UMECLIDINIUM BROMIDE 62.5 MCG/ACT IN AEPB
1.0000 | INHALATION_SPRAY | Freq: Every day | RESPIRATORY_TRACT | Status: DC
Start: 2022-06-21 — End: 2022-06-22
  Administered 2022-06-21 – 2022-06-22 (×2): 1 via RESPIRATORY_TRACT
  Filled 2022-06-20: qty 7

## 2022-06-20 MED ORDER — FLUTICASONE FUROATE-VILANTEROL 100-25 MCG/ACT IN AEPB
1.0000 | INHALATION_SPRAY | Freq: Every day | RESPIRATORY_TRACT | Status: DC
Start: 2022-06-21 — End: 2022-06-22
  Administered 2022-06-21 – 2022-06-22 (×2): 1 via RESPIRATORY_TRACT
  Filled 2022-06-20: qty 28

## 2022-06-20 NOTE — Consult Note (Signed)
  [facility] GI Inpatient Consult Note   Chinelo Benn Keith Laquiesha Piacente, M.D.  Reason for Consult: Presumed acute blood loss anemia, epigastric pain   Attending Requesting Consult: Agbata, Tochukwu, MD   Outpatient Primary Physician: Elise Payne, FNP  History of Present Illness: Theresa [name] is a 66 y.o. female with a history of CHF, COPD, depression, edema, carpal tunnel syndrome who presented to emergency room with progressive increase epigastric pain yesterday.  Patient was noted also to have a very significant drop in her hemoglobin to 7.4 from a baseline of 13.5.  Despite the drop, patient denies any known history of melena or frank hematemesis, although she does describe "a blood type taste in my mouth" when awakening in the mornings for the last few days.  She denies any chest pain, palpitations or exacerbation and shortness of breath.  She does complain of weakness.  Past Medical History:  Past Medical History:  Diagnosis Date   Acute postoperative pain 10/03/2017   Anxiety    BiPAP (biphasic positive airway pressure) dependence    at hs   Carpal tunnel syndrome    CHF (congestive heart failure) (HCC)    1/18   CHF (congestive heart failure) (HCC)    Chronic generalized abdominal pain    Chronic rhinitis    COPD (chronic obstructive pulmonary disease) (HCC)    DDD (degenerative disc disease), cervical    DDD (degenerative disc disease), lumbosacral    Depression    Dyspnea    Edema    Flu    1/18   Gastritis    GERD (gastroesophageal reflux disease)    Hematuria    Hernia of abdominal wall 07/17/2018   Hernia, abdominal    Hypertension    Kidney stones    Low back pain    Lumbar radiculitis    Malodorous urine    Muscle weakness    Obesity    Oxygen dependent    Pneumonia due to COVID-19 virus 02/03/2020   Renal cyst    Sensory urge incontinence    Thyroid activity decreased    9/19   Tobacco abuse    Wheezing     Problem List: Patient Active Problem  List   Diagnosis Date Noted   ABLA (acute blood loss anemia) 06/19/2022   CHF (congestive heart failure) (HCC)    Paroxysmal atrial fibrillation (HCC)    Hospital discharge follow-up 05/05/2022   Anxiety 05/05/2022   Atrial flutter with rapid ventricular response (HCC) 04/26/2022   Hyperlipidemia, unspecified 04/25/2022   COPD exacerbation (HCC) 04/24/2022   Abnormal MRI, cervical spine 04/11/2022   Aphasia 04/08/2022   Degenerative arthritis of cervical spine 04/07/2022   Obesity (BMI 30-39.9) 04/02/2022   Headache 04/01/2022   Pulmonary hypertension (HCC) 04/01/2022   BiPAP (biphasic positive airway pressure) dependence    Acute on chronic diastolic CHF (congestive heart failure) (HCC)    Hypothyroidism, unspecified    Depression with anxiety    Chronic pain    COPD with acute exacerbation (HCC) 08/06/2021   Leukocytosis 08/06/2021   Encounter for tobacco use cessation counseling 08/06/2021   Chronic shoulder pain (Bilateral) 05/05/2021   Chronic use of opiate for therapeutic purpose 01/26/2021   Left thigh pain 10/28/2020   Uncomplicated opioid dependence (HCC) 04/08/2020   Pharmacologic therapy 04/08/2020   Numbness and tingling in right hand 12/31/2019   Numbness in feet 12/31/2019   Altered consciousness 11/26/2019   Frequent falls 11/26/2019   Weakness of both lower extremities 11/26/2019     Lumbar disc extrusion (L2-3) (Left) 09/10/2019   Chronic knee pain (Left) 08/14/2019   Chronic left-sided lumbar radiculopathy 08/14/2019   Abnormal MRI, lumbar spine (07/23/2019) 08/14/2019   Acute on chronic respiratory failure with hypoxia and hypercapnia (HCC) 07/06/2019   Cervicalgia (Bilateral) (R>L) 09/24/2018   Spondylosis without myelopathy or radiculopathy, lumbosacral region 09/24/2018   Epigastric hernia 05/04/2018   Chronic lower extremity pain (Bilateral) (L>R) 01/25/2018   Altered mental status, unspecified 12/09/2017   Chronic hip pain (3ry area of Pain)  (Bilateral) (L>R) 11/27/2017   COPD (chronic obstructive pulmonary disease) (HCC) 08/11/2017   Other long term (current) drug therapy 08/08/2017   Disorder of bone, unspecified 08/08/2017   COPD with hypoxia (HCC) 06/27/2017   Arthralgia of acromioclavicular joint (Right) 06/15/2017   Acromioclavicular joint DJD (Right) 06/15/2017   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   Chronic shoulder pain (Left) 05/11/2017   Elevated brain natriuretic peptide (BNP) level 05/11/2017   Grief at loss of child 02/14/2017   Lumbar facet hypertrophy (multilevel) 11/17/2016   Lumbar spinal stenosis (L4-5) 11/17/2016   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) 11/17/2016   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) 11/17/2016   Lumbosacral radiculopathy at L4 11/17/2016   Supplemental oxygen dependent 08/01/2016   Sleep apnea 06/02/2016   Long term current use of opiate analgesic 01/12/2016   Long term prescription opiate use 01/12/2016   Opiate use (15 MME/Day) 01/12/2016   Lumbar facet syndrome (Bilateral) (L>R) 01/12/2016   Chronic sacroiliac joint pain (Bilateral) 01/12/2016   Vitamin D deficiency 12/30/2015   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) 12/28/2015   Pulmonary arterial hypertension (HCC) 11/19/2015   Coagulation disorder (HCC) 10/22/2015   Chronic pain syndrome (significant psychosocial component) 09/21/2015   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 09/21/2015   Encounter for therapeutic drug level monitoring 09/16/2015   Encounter for chronic pain management 09/16/2015   Pain management 09/16/2015   Neurogenic pain 09/16/2015   Neuropathic pain 09/16/2015   Musculoskeletal pain 09/16/2015   Lumbar spondylosis (L4-5) 09/16/2015   Chronic low back pain (1ry area of Pain) (Bilateral) (L>R) w/o sciatica 09/16/2015   Chronic lower extremity pain (2ry area of Pain) (Left) 09/16/2015   Chronic lumbar radicular pain (L4 & S1 Dermatome)  (Left) 09/16/2015   Chronic shoulder pain (Right side) 09/16/2015   Chronic upper extremity pain (Right-sided) 09/16/2015   Cervical radiculitis (Right side) 09/16/2015   Abnormal x-ray of lumbar spine 09/16/2015   Chronic diastolic CHF (congestive heart failure) (HCC) 07/15/2015   Chest pain 07/15/2015   Tobacco abuse 07/15/2015   Chronic respiratory failure with hypoxia (HCC) 04/28/2015   Tendinitis 04/21/2015   Blood clotting disorder (HCC) 04/21/2015   Decreased motor strength 07/16/2014   Depression 07/16/2014   Chronic rhinitis 07/16/2014   Carpal tunnel syndrome 07/16/2014   Anxiety state 07/16/2014   Nausea without vomiting 07/16/2014   Edema 07/16/2014   Generalized muscle weakness 07/16/2014   Neuritis or radiculitis due to rupture of lumbar intervertebral disc 07/02/2014   DDD (degenerative disc disease), lumbar 07/02/2014   Nicotine dependence 02/27/2014   CAFL (chronic airflow limitation) (HCC) 02/27/2014   Herpes zoster 07/16/2009   Essential (primary) hypertension 03/27/2008    Past Surgical History: Past Surgical History:  Procedure Laterality Date   ABDOMINAL HYSTERECTOMY     CARDIOVERSION N/A 04/29/2022   Procedure: CARDIOVERSION;  Surgeon: Kowalski, Bruce J, MD;  Location: [facility] ORS;  Service: Cardiovascular;  Laterality: N/A;   CARPAL TUNNEL RELEASE Left 2012     EPIGASTRIC HERNIA REPAIR N/A 08/13/2018   HERNIA REPAIR EPIGASTRIC ADULT; polypropylene mesh reinforcement.  Surgeon: Byrnett, Jeffrey W, MD;  Location: [facility] ORS;  Service: General;  Laterality: N/A;   RIGHT/LEFT HEART CATH AND CORONARY ANGIOGRAPHY N/A 07/11/2019   Procedure: RIGHT/LEFT HEART CATH AND CORONARY ANGIOGRAPHY;  Surgeon: Callwood, Dwayne D, MD;  Location: [facility] INVASIVE CV [facility];  Service: Cardiovascular;  Laterality: N/A;   TEE WITHOUT CARDIOVERSION N/A 04/29/2022   Procedure: TRANSESOPHAGEAL ECHOCARDIOGRAM (TEE);  Surgeon: Kowalski, Bruce J, MD;  Location: [facility] ORS;  Service: Cardiovascular;   Laterality: N/A;   TONSILLECTOMY     TUBAL LIGATION     UMBILICAL HERNIA REPAIR N/A 08/13/2018   HERNIA REPAIR UMBILICAL ADULT; polypropylene mesh reinforcement Surgeon: Byrnett, Jeffrey W, MD;  Location: [facility] ORS;  Service: General;  Laterality: N/A;    Allergies: Allergies  Allergen Reactions   Gabapentin Other (See Comments)    Higher doses of 800 mg, 600 mg, 300 mg causes excessive sedation and contributes to numerous mechanical falls. (Unsure if 100 mg dose can be tolerated)   Codeine Nausea And Vomiting   Meloxicam Other (See Comments)    Stomach pain    Home Medications: (Not in a hospital admission)  Home medication reconciliation was completed with the patient.   Scheduled Inpatient Medications:    amiodarone  200 mg Oral Daily   busPIRone  5 mg Oral BID   [START ON 06/21/2022] fluticasone furoate-vilanterol  1 puff Inhalation Daily   And   [START ON 06/21/2022] umeclidinium bromide  1 puff Inhalation Daily   ipratropium-albuterol  3 mL Nebulization Q8H   levothyroxine  25 mcg Oral Q0600   pantoprazole (PROTONIX) IV  40 mg Intravenous Q12H   potassium chloride SA  20 mEq Oral BID   roflumilast  500 mcg Oral Daily   sertraline  100 mg Oral QHS   simvastatin  20 mg Oral QHS    Continuous Inpatient Infusions:    PRN Inpatient Medications:  acetaminophen, butalbital-acetaminophen-caffeine, HYDROmorphone (DILAUDID) injection, ondansetron **OR** ondansetron (ZOFRAN) IV, oxyCODONE  Family History: family history includes Alcohol abuse in her father; Breast cancer in her sister; Cancer in her brother, brother, and sister; Coronary artery disease in her mother; Heart disease in her father and mother; Lung cancer in her sister; Pneumonia in her brother; Stroke in her mother.   GI Family History: Negative  Social History:   reports that she has been smoking cigarettes. She has a 22.00 pack-year smoking history. She has never used smokeless tobacco. She reports current drug  use. She reports that she does not drink alcohol. The patient denies ETOH, tobacco, or drug use.    Review of Systems: Review of Systems - Negative except HPI  Physical Examination: BP (!) 101/59 (BP Location: Left Arm)   Pulse 78   Temp 98.4 F (36.9 C) (Oral)   Resp 15   Ht 5' 2" (1.575 m)   Wt 88.5 kg   SpO2 99%   BMI 35.67 kg/m  Physical Exam Constitutional:      General: She is not in acute distress.    Appearance: She is well-developed. She is obese. She is ill-appearing. She is not toxic-appearing.  HENT:     Head: Normocephalic and atraumatic.     Mouth/Throat:     Mouth: Mucous membranes are moist.  Eyes:     Extraocular Movements: Extraocular movements intact.     Pupils: Pupils are equal, round, and reactive to light.  Cardiovascular:       Rate and Rhythm: Normal rate.     Heart sounds: Normal heart sounds. No murmur heard.    No gallop.  Pulmonary:     Effort: Pulmonary effort is normal. No respiratory distress.     Breath sounds: No stridor. No wheezing or rhonchi.  Abdominal:     General: Bowel sounds are normal. There is distension. There is no abdominal bruit. There are no signs of injury.     Palpations: Abdomen is soft. There is no hepatomegaly or mass.     Tenderness: There is abdominal tenderness in the epigastric area. There is no guarding or rebound.     Hernia: No hernia is present.  Skin:    General: Skin is warm and dry.     Capillary Refill: Capillary refill takes less than 2 seconds.  Neurological:     General: No focal deficit present.     Mental Status: She is alert.  Psychiatric:        Mood and Affect: Mood is anxious. Mood is not depressed.        Behavior: Behavior normal.     Data: Lab Results  Component Value Date   WBC 9.8 06/19/2022   HGB 7.9 (L) 06/20/2022   HCT 27.3 (L) 06/20/2022   MCV 86.1 06/19/2022   PLT 306 06/19/2022   Recent Labs  Lab 06/19/22 1113 06/19/22 2031 06/20/22 0809  HGB 7.4* 7.6* 7.9*   Lab  Results  Component Value Date   NA 138 06/20/2022   K 4.2 06/20/2022   CL 101 06/20/2022   CO2 32 06/20/2022   BUN 18 06/20/2022   CREATININE 0.92 06/20/2022   Lab Results  Component Value Date   ALT 18 06/19/2022   AST 21 06/19/2022   ALKPHOS 73 06/19/2022   BILITOT 0.6 06/19/2022   Recent Labs  Lab 06/19/22 1318  INR 1.0      Latest Ref Rng & Units 06/20/2022    8:09 AM 06/19/2022    8:31 PM 06/19/2022   11:13 AM  CBC  WBC 4.0 - 10.5 K/uL   9.8   Hemoglobin 12.0 - 15.0 g/dL 7.9  7.6  7.4   Hematocrit 36.0 - 46.0 % 27.3  26.2  26.0   Platelets 150 - 400 K/uL   306     STUDIES: CT ABDOMEN PELVIS W CONTRAST  Result Date: 06/19/2022 CLINICAL DATA:  Epigastric pain EXAM: CT ABDOMEN AND PELVIS WITH CONTRAST TECHNIQUE: Multidetector CT imaging of the abdomen and pelvis was performed using the standard protocol following bolus administration of intravenous contrast. RADIATION DOSE REDUCTION: This exam was performed according to the departmental dose-optimization program which includes automated exposure control, adjustment of the mA and/or kV according to patient size and/or use of iterative reconstruction technique. CONTRAST:  100mL OMNIPAQUE IOHEXOL 300 MG/ML  SOLN COMPARISON:  10/31/2016 FINDINGS: Lower chest: Coronary artery calcifications are seen. Breathing motion limits evaluation of lower lung fields. Hepatobiliary: There are a few scattered small low-density lesions, possibly cysts or hemangiomas. There is interval increase in number of these low-density foci. There is no dilation of bile ducts. Gallbladder is not distended. Pancreas: No focal abnormalities are seen. Spleen: Spleen measures 12.2 cm in maximum diameter. Adrenals/Urinary Tract: There is a 9 mm nodular density in the left adrenal which has not changed significantly. Right adrenal is unremarkable. There is no hydronephrosis. There are a few small bilateral renal stones each measuring less than 4 mm. There is a 1.4 cm  smooth marginated fluid density   lesion in the upper pole of right kidney suggesting renal cyst. There is 8 mm lesion in the medial aspect of right kidney with density measurements ranging up to 92 Hounsfield units, possibly hyperdense hemorrhagic cyst. Ureters are not dilated. Urinary bladder is unremarkable. Stomach/Bowel: Stomach is not distended. Small bowel loops are unremarkable. Appendix is not dilated. There is no significant wall thickening in colon. There is no pericolic stranding. Vascular/Lymphatic: Fairly extensive arterial calcifications are seen. Reproductive: Unremarkable. Other: There is no ascites or pneumoperitoneum. Small bilateral inguinal hernias containing fat are noted. Musculoskeletal: Degenerative changes are noted in lumbar spine, most severe at L2-L3, L4-L5 and L5-S1 levels with encroachment of neural foramina. IMPRESSION: There is no evidence of intestinal obstruction or pneumoperitoneum. Appendix is not dilated. There is no hydronephrosis. There are few small bilateral renal stones. There is 1.4 cm cyst in the upper pole of right kidney. There is 8 mm hyperdense lesion, possibly hemorrhagic cyst in the medial right kidney. There are multiple low-density lesions in liver, possibly cysts or hemangiomas. Lumbar spondylosis. Other findings as described in the body of the report. Electronically Signed   By: Palani  Rathinasamy M.D.   On: 06/19/2022 13:11   DG Chest 2 View  Result Date: 06/19/2022 CLINICAL DATA:  Shortness of breath EXAM: CHEST - 2 VIEW COMPARISON:  Radiograph 04/26/2022, chest CT 04/09/2022 FINDINGS: Unchanged cardiomediastinal silhouette with enlarged pulmonary arteries. Mild interstitial opacities. No large pleural effusion. No pneumothorax. No acute osseous abnormality. Thoracic spondylosis. IMPRESSION: Mild interstitial opacities which could reflect mild edema or changes related to emphysema. Electronically Signed   By: Jacob  Kahn M.D.   On: 06/19/2022 11:37    @IMAGES@  Assessment:  Epigastric pain. Suspect PUD vs. Gastritis given frequent NSAID use. Anemia presumed secondary to GI blood loss. Chronic anticoagulation - Xarelto - held by Medicine Team yesterday. CHF - no chest pain    Recommendations:  Continue to hold Xarelto EGD tomorrow. The patient understands the nature of the planned procedure, indications, risks, alternatives and potential complications including but not limited to bleeding, infection, perforation, damage to internal organs and possible oversedation/side effects from anesthesia. The patient agrees and gives consent to proceed.  Please refer to procedure notes for findings, recommendations and patient disposition/instructions. Acid suppression.  Thank you for the consult. Please call with questions or concerns.  Tommie Bohlken, "Keith" MD [facility] [address] [place], [place] [address] [phone]  06/20/2022 9:47 AM      

## 2022-06-20 NOTE — Progress Notes (Signed)
PROGRESS NOTE    Theresa [name]  KDT:[id number] DOB: 12-28-1955 DOA: 06/19/2022  PCP: Birdie Sons, MD   Brief Narrative:  This 66 years old female with PMH significant for chronic diastolic CHF, pulmonary hypertension, COPD on 2.5 L of supplemental oxygen at baseline, GERD, hypothyroidism, depression, nicotine dependent, obstructive sleep apnea, chronic pain syndrome, obesity presented in the ED for the evaluation of worsening shortness of breath mostly on exertion over the last several days as well as abdominal pain.  Patient reports abdominal pain is progressive and getting worse and has developed coffee-ground emesis x1.  She denies any NSAIDs use.  Labs show significant drop in her hemoglobin from 13.5-7.4 over the last 1 month.  She is also found to have hypokalemia.  Patient is admitted for GI bleed and started on IV pantoprazole.  GI is consulted and Patient is scheduled to have EGD tomorrow.  Assessment & Plan:   Principal Problem:   ABLA (acute blood loss anemia) Active Problems:   Essential (primary) hypertension   Pulmonary arterial hypertension (HCC)   Hypothyroidism, unspecified   Obesity (BMI 30-39.9)   Nicotine dependence   Chronic respiratory failure with hypoxia (HCC)   CHF (congestive heart failure) (HCC)   Paroxysmal atrial fibrillation (HCC)  Acute on chronic blood loss anemia: Patient presented for the evaluation of abdominal pain mostly in the epigastric and periumbilical area associated with an episode of coffee-ground emesis. She is found to have significant hemoglobin drop from 13.5>7.4 over the period of 4 weeks. She also complains of having exertional shortness of breath that may be related to symptomatic anemia.  Likely cause could be peptic ulcer disease. Hold Xarelto and aspirin. Continue pantoprazole 40 mg IV every 12 hours. GI is consulted  and scheduled for EGD in the morning. H&H remains stable after 1 unit PRBC.  7.4> 7.9> 7.8 Transfuse if  hemoglobin drops below 7.  Essential hypertension: Hold Cardizem and metoprolol since blood pressure is on the soft side.  Pulmonary artery hypertension: Hold medications as blood pressure is soft.  Hypothyroidism: Continue Synthroid 25 mcg daily  Paroxysmal atrial fibrillation: Continue amiodarone for rate control. Hold metoprolol and Cardizem since patient has soft blood pressure Hold Xarelto and aspirin due to blood loss anemia  Chronic diastolic CHF: Not in any acute exacerbation. Last known LVEF 60 to 65%  was July 2023. Hold metolazone and torsemide as blood pressure is on the soft side.   Hold metoprolol for now  Chronic hypoxic respiratory failure secondary to COPD. Continue supplemental oxygen 2.5 L at baseline.  Nicotine dependence: Smoking cessation discussed in detail.   Patient refuses to use nicotine patch at this time.   Not interested in quit smoking.  Obesity: Diet and exercise discussed in detail. Estimated body mass index is 35.67 kg/m as calculated from the following:   Height as of this encounter: _0  (1.575 m).   Weight as of this encounter: 88.5 kg.    DVT prophylaxis: SCDs Code Status: Full code. Family Communication: Son at bed side. Disposition Plan:  Status is: Inpatient Remains inpatient appropriate because: Admitted for abdominal pain and coffee-ground emesis.  H&H has significantly dropped from 13.5-7.4 in a period of 4 weeks.  S/p 1 PRBC hemoglobin remained 7.9.  GI is consulted,  scheduled for EGD in the morning.    Consultants:  Gastroenterology  Procedures: Scheduled EGD 06/21/2022 Antimicrobials: None  Subjective: Patient was seen and examined at bedside.  Overnight events noted. Patient reports feeling better , she  denies any further bleeding in the vomitus.   She reported still having significant abdominal pain,  asking for pain medications.  Objective: Vitals:   06/20/22 0400 06/20/22 0500 06/20/22 0600 06/20/22 1100   BP: (!) 89/51 110/63 (!) 101/59 (!) 98/58  Pulse: 77 77 78 83  Resp: _0 (!) 21  Temp: 98.1 F (36.7 C)  98.4 F (36.9 C) 98.8 F (37.1 C)  TempSrc: Oral  Oral Oral  SpO2: 97% 99% 99% 95%  Weight:      Height:        Intake/Output Summary (Last 24 hours) at 06/20/2022 1343 Last data filed at 06/20/2022 0651 Gross per 24 hour  Intake 1132.75 ml  Output 600 ml  Net 532.75 ml   Filed Weights   06/19/22 1111  Weight: 88.5 kg    Examination:  General exam: Appears comfortable, not in any acute distress.  Remains in pain. Respiratory system: CTA bilaterally, respiratory effort normal, RR 15 Cardiovascular system: S1 & S2 heard, regular rate and rhythm, no murmur.  Gastrointestinal system: Abdomen is soft, mildly tender, nondistended, BS+. Central nervous system: Alert and oriented x 3. No focal neurological deficits. Extremities: No edema, no cyanosis, no clubbing. Skin: No rashes, lesions or ulcers Psychiatry: Judgement and insight appear normal. Mood & affect appropriate.     Data Reviewed: I have personally reviewed following labs and imaging studies  CBC: Recent Labs  Lab 06/19/22 1113 06/19/22 2031 06/20/22 0809  WBC 9.8  --   --   HGB 7.4* 7.6* 7.9*  HCT 26.0* 26.2* 27.3*  MCV 86.1  --   --   PLT 306  --   --    Basic Metabolic Panel: Recent Labs  Lab 06/19/22 1113 06/19/22 1401 06/20/22 0809  NA 133*  --  138  K 2.9*  --  4.2  CL 91*  --  101  CO2 32  --  32  GLUCOSE 99  --  102*  BUN 22  --  18  CREATININE 0.91  --  0.92  CALCIUM 9.3  --  9.4  MG  --  2.7*  --    GFR: Estimated Creatinine Clearance: 63 mL/min (by C-G formula based on SCr of 0.92 mg/dL). Liver Function Tests: Recent Labs  Lab 06/19/22 1113  AST 21  ALT 18  ALKPHOS 73  BILITOT 0.6  PROT 7.3  ALBUMIN 3.9   Recent Labs  Lab 06/19/22 1113  LIPASE 57*   No results for input(s): "AMMONIA" in the last 168 hours. Coagulation Profile: Recent Labs  Lab 06/19/22 1318   INR 1.0   Cardiac Enzymes: No results for input(s): "CKTOTAL", "CKMB", "CKMBINDEX", "TROPONINI" in the last 168 hours. BNP (last 3 results) No results for input(s): "PROBNP" in the last 8760 hours. HbA1C: No results for input(s): "HGBA1C" in the last 72 hours. CBG: No results for input(s): "GLUCAP" in the last 168 hours. Lipid Profile: No results for input(s): "CHOL", "HDL", "LDLCALC", "TRIG", "CHOLHDL", "LDLDIRECT" in the last 72 hours. Thyroid Function Tests: No results for input(s): "TSH", "T4TOTAL", "FREET4", "T3FREE", "THYROIDAB" in the last 72 hours. Anemia Panel: Recent Labs    06/19/22 1201  TIBC 514*  IRON 17*   Sepsis Labs: No results for input(s): "PROCALCITON", "LATICACIDVEN" in the last 168 hours.  No results found for this or any previous visit (from the past 240 hour(s)).   Radiology Studies: CT ABDOMEN PELVIS W CONTRAST  Result Date: 06/19/2022 CLINICAL DATA:  Epigastric pain EXAM: CT  ABDOMEN AND PELVIS WITH CONTRAST TECHNIQUE: Multidetector CT imaging of the abdomen and pelvis was performed using the standard protocol following bolus administration of intravenous contrast. RADIATION DOSE REDUCTION: This exam was performed according to the departmental dose-optimization program which includes automated exposure control, adjustment of the mA and/or kV according to patient size and/or use of iterative reconstruction technique. CONTRAST:  143m OMNIPAQUE IOHEXOL 300 MG/ML  SOLN COMPARISON:  10/31/2016 FINDINGS: Lower chest: Coronary artery calcifications are seen. Breathing motion limits evaluation of lower lung fields. Hepatobiliary: There are a few scattered small low-density lesions, possibly cysts or hemangiomas. There is interval increase in number of these low-density foci. There is no dilation of bile ducts. Gallbladder is not distended. Pancreas: No focal abnormalities are seen. Spleen: Spleen measures 12.2 cm in maximum diameter. Adrenals/Urinary Tract: There is a  9 mm nodular density in the left adrenal which has not changed significantly. Right adrenal is unremarkable. There is no hydronephrosis. There are a few small bilateral renal stones each measuring less than 4 mm. There is a 1.4 cm smooth marginated fluid density lesion in the upper pole of right kidney suggesting renal cyst. There is 8 mm lesion in the medial aspect of right kidney with density measurements ranging up to 92 Hounsfield units, possibly hyperdense hemorrhagic cyst. Ureters are not dilated. Urinary bladder is unremarkable. Stomach/Bowel: Stomach is not distended. Small bowel loops are unremarkable. Appendix is not dilated. There is no significant wall thickening in colon. There is no pericolic stranding. Vascular/Lymphatic: Fairly extensive arterial calcifications are seen. Reproductive: Unremarkable. Other: There is no ascites or pneumoperitoneum. Small bilateral inguinal hernias containing fat are noted. Musculoskeletal: Degenerative changes are noted in lumbar spine, most severe at L2-L3, L4-L5 and L5-S1 levels with encroachment of neural foramina. IMPRESSION: There is no evidence of intestinal obstruction or pneumoperitoneum. Appendix is not dilated. There is no hydronephrosis. There are few small bilateral renal stones. There is 1.4 cm cyst in the upper pole of right kidney. There is 8 mm hyperdense lesion, possibly hemorrhagic cyst in the medial right kidney. There are multiple low-density lesions in liver, possibly cysts or hemangiomas. Lumbar spondylosis. Other findings as described in the body of the report. Electronically Signed   By: PElmer PickerM.D.   On: 06/19/2022 13:11   DG Chest 2 View  Result Date: 06/19/2022 CLINICAL DATA:  Shortness of breath EXAM: CHEST - 2 VIEW COMPARISON:  Radiograph 04/26/2022, chest CT 04/09/2022 FINDINGS: Unchanged cardiomediastinal silhouette with enlarged pulmonary arteries. Mild interstitial opacities. No large pleural effusion. No pneumothorax. No  acute osseous abnormality. Thoracic spondylosis. IMPRESSION: Mild interstitial opacities which could reflect mild edema or changes related to emphysema. Electronically Signed   By: JMaurine SimmeringM.D.   On: 06/19/2022 11:37    Scheduled Meds:  amiodarone  200 mg Oral Daily   busPIRone  5 mg Oral BID   [START ON 06/21/2022] fluticasone furoate-vilanterol  1 puff Inhalation Daily   And   [START ON 06/21/2022] umeclidinium bromide  1 puff Inhalation Daily   ipratropium-albuterol  3 mL Nebulization Q8H   levothyroxine  25 mcg Oral Q0600   pantoprazole (PROTONIX) IV  40 mg Intravenous Q12H   potassium chloride SA  20 mEq Oral BID   roflumilast  500 mcg Oral Daily   sertraline  100 mg Oral QHS   simvastatin  20 mg Oral QHS   Continuous Infusions:   LOS: 1 day    Time spent: 50 mins    PShawna Clamp MD [facility]   If 7PM-7AM, please contact night-coverage

## 2022-06-20 NOTE — H&P (View-Only) (Signed)
[facility] GI Inpatient Consult Note   Kathline Magic, M.D.  Reason for Consult: Presumed acute blood loss anemia, epigastric pain   Attending Requesting Consult: Collier Bullock, MD   Outpatient Primary Physician: Tally Joe, FNP  History of Present Illness: Theresa [name] is a 66 y.o. female with a history of CHF, COPD, depression, edema, carpal tunnel syndrome who presented to emergency room with progressive increase epigastric pain yesterday.  Patient was noted also to have a very significant drop in her hemoglobin to 7.4 from a baseline of 13.5.  Despite the drop, patient denies any known history of melena or frank hematemesis, although she does describe "a blood type taste in my mouth" when awakening in the mornings for the last few days.  She denies any chest pain, palpitations or exacerbation and shortness of breath.  She does complain of weakness.  Past Medical History:  Past Medical History:  Diagnosis Date   Acute postoperative pain 10/03/2017   Anxiety    BiPAP (biphasic positive airway pressure) dependence    at hs   Carpal tunnel syndrome    CHF (congestive heart failure) ([place])    1/18   CHF (congestive heart failure) (HCC)    Chronic generalized abdominal pain    Chronic rhinitis    COPD (chronic obstructive pulmonary disease) (HCC)    DDD (degenerative disc disease), cervical    DDD (degenerative disc disease), lumbosacral    Depression    Dyspnea    Edema    Flu    1/18   Gastritis    GERD (gastroesophageal reflux disease)    Hematuria    Hernia of abdominal wall 07/17/2018   Hernia, abdominal    Hypertension    Kidney stones    Low back pain    Lumbar radiculitis    Malodorous urine    Muscle weakness    Obesity    Oxygen dependent    Pneumonia due to COVID-19 virus 02/03/2020   Renal cyst    Sensory urge incontinence    Thyroid activity decreased    9/19   Tobacco abuse    Wheezing     Problem List: Patient Active Problem  List   Diagnosis Date Noted   ABLA (acute blood loss anemia) 06/19/2022   CHF (congestive heart failure) (HCC)    Paroxysmal atrial fibrillation [facility])    Hospital discharge follow-up 05/05/2022   Anxiety 05/05/2022   Atrial flutter with rapid ventricular response ([place]) 04/26/2022   Hyperlipidemia, unspecified 04/25/2022   COPD exacerbation ([place]) 04/24/2022   Abnormal MRI, cervical spine 04/11/2022   Aphasia 04/08/2022   Degenerative arthritis of cervical spine 04/07/2022   Obesity (BMI 30-39.9) 04/02/2022   Headache 04/01/2022   Pulmonary hypertension ([place]) 04/01/2022   BiPAP (biphasic positive airway pressure) dependence    Acute on chronic diastolic CHF (congestive heart failure) ([place])    Hypothyroidism, unspecified    Depression with anxiety    Chronic pain    COPD with acute exacerbation ([place]) 08/06/2021   Leukocytosis 08/06/2021   Encounter for tobacco use cessation counseling 08/06/2021   Chronic shoulder pain (Bilateral) 05/05/2021   Chronic use of opiate for therapeutic purpose 01/26/2021   Left thigh pain 29/56/2130   Uncomplicated opioid dependence ([place]) 04/08/2020   Pharmacologic therapy 04/08/2020   Numbness and tingling in right hand 12/31/2019   Numbness in feet 12/31/2019   Altered consciousness 11/26/2019   Frequent falls 11/26/2019   Weakness of both lower extremities 11/26/2019  Lumbar disc extrusion (L2-3) (Left) 09/10/2019   Chronic knee pain (Left) 08/14/2019   Chronic left-sided lumbar radiculopathy 08/14/2019   Abnormal MRI, lumbar spine (07/23/2019) 08/14/2019   Acute on chronic respiratory failure with hypoxia and hypercapnia (HCC) 07/06/2019   Cervicalgia (Bilateral) (R>L) 09/24/2018   Spondylosis without myelopathy or radiculopathy, lumbosacral region 09/24/2018   Epigastric hernia 05/04/2018   Chronic lower extremity pain (Bilateral) (L>R) 01/25/2018   Altered mental status, unspecified 12/09/2017   Chronic hip pain (3ry area of Pain)  (Bilateral) (L>R) 11/27/2017   COPD (chronic obstructive pulmonary disease) ([place]) 08/11/2017   Other long term (current) drug therapy 08/08/2017   Disorder of bone, unspecified 08/08/2017   COPD with hypoxia ([place]) 06/27/2017   Arthralgia of acromioclavicular joint (Right) 06/15/2017   Acromioclavicular joint DJD (Right) 06/15/2017   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   Chronic shoulder pain (Left) 05/11/2017   Elevated brain natriuretic peptide (BNP) level 05/11/2017   Grief at loss of child 02/14/2017   Lumbar facet hypertrophy (multilevel) 11/17/2016   Lumbar spinal stenosis (L4-5) 11/17/2016   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) 11/17/2016   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) 11/17/2016   Lumbosacral radiculopathy at L4 11/17/2016   Supplemental oxygen dependent 08/01/2016   Sleep apnea 06/02/2016   Long term current use of opiate analgesic 01/12/2016   Long term prescription opiate use 01/12/2016   Opiate use (15 MME/Day) 01/12/2016   Lumbar facet syndrome (Bilateral) (L>R) 01/12/2016   Chronic sacroiliac joint pain (Bilateral) 01/12/2016   Vitamin D deficiency 12/30/2015   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) 12/28/2015   Pulmonary arterial hypertension ([place]) 11/19/2015   Coagulation disorder ([place]) 10/22/2015   Chronic pain syndrome (significant psychosocial component) 09/21/2015   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 09/21/2015   Encounter for therapeutic drug level monitoring 09/16/2015   Encounter for chronic pain management 09/16/2015   Pain management 09/16/2015   Neurogenic pain 09/16/2015   Neuropathic pain 09/16/2015   Musculoskeletal pain 09/16/2015   Lumbar spondylosis (L4-5) 09/16/2015   Chronic low back pain (1ry area of Pain) (Bilateral) (L>R) w/o sciatica 09/16/2015   Chronic lower extremity pain (2ry area of Pain) (Left) 09/16/2015   Chronic lumbar radicular pain (L4 & S1 Dermatome)  (Left) 09/16/2015   Chronic shoulder pain (Right side) 09/16/2015   Chronic upper extremity pain (Right-sided) 09/16/2015   Cervical radiculitis (Right side) 09/16/2015   Abnormal x-ray of lumbar spine 09/16/2015   Chronic diastolic CHF (congestive heart failure) ([place]) 07/15/2015   Chest pain 07/15/2015   Tobacco abuse 07/15/2015   Chronic respiratory failure with hypoxia ([place]) 04/28/2015   Tendinitis 04/21/2015   Blood clotting disorder ([place]) 04/21/2015   Decreased motor strength 07/16/2014   Depression 07/16/2014   Chronic rhinitis 07/16/2014   Carpal tunnel syndrome 07/16/2014   Anxiety state 07/16/2014   Nausea without vomiting 07/16/2014   Edema 07/16/2014   Generalized muscle weakness 07/16/2014   Neuritis or radiculitis due to rupture of lumbar intervertebral disc 07/02/2014   DDD (degenerative disc disease), lumbar 07/02/2014   Nicotine dependence 02/27/2014   CAFL (chronic airflow limitation) ([place]) 02/27/2014   Herpes zoster 07/16/2009   Essential (primary) hypertension 03/27/2008    Past Surgical History: Past Surgical History:  Procedure Laterality Date   ABDOMINAL HYSTERECTOMY     CARDIOVERSION N/A 04/29/2022   Procedure: CARDIOVERSION;  Surgeon: Corey Skains, MD;  Location: [facility] ORS;  Service: Cardiovascular;  Laterality: N/A;   CARPAL TUNNEL RELEASE Left 2012  EPIGASTRIC HERNIA REPAIR N/A 08/13/2018   HERNIA REPAIR EPIGASTRIC ADULT; polypropylene mesh reinforcement.  Surgeon: Robert Bellow, MD;  Location: [facility] ORS;  Service: General;  Laterality: N/A;   RIGHT/LEFT HEART CATH AND CORONARY ANGIOGRAPHY N/A 07/11/2019   Procedure: RIGHT/LEFT HEART CATH AND CORONARY ANGIOGRAPHY;  Surgeon: Yolonda Kida, MD;  Location: [facility];  Service: Cardiovascular;  Laterality: N/A;   TEE WITHOUT CARDIOVERSION N/A 04/29/2022   Procedure: TRANSESOPHAGEAL ECHOCARDIOGRAM (TEE);  Surgeon: Corey Skains, MD;  Location: [facility] ORS;  Service: Cardiovascular;   Laterality: N/A;   TONSILLECTOMY     TUBAL LIGATION     UMBILICAL HERNIA REPAIR N/A 08/13/2018   HERNIA REPAIR UMBILICAL ADULT; polypropylene mesh reinforcement Surgeon: Robert Bellow, MD;  Location: [facility] ORS;  Service: General;  Laterality: N/A;    Allergies: Allergies  Allergen Reactions   Gabapentin Other (See Comments)    Higher doses of 800 mg, 600 mg, 300 mg causes excessive sedation and contributes to numerous mechanical falls. (Unsure if 100 mg dose can be tolerated)   Codeine Nausea And Vomiting   Meloxicam Other (See Comments)    Stomach pain    Home Medications: (Not in a hospital admission)  Home medication reconciliation was completed with the patient.   Scheduled Inpatient Medications:    amiodarone  200 mg Oral Daily   busPIRone  5 mg Oral BID   [START ON 06/21/2022] fluticasone furoate-vilanterol  1 puff Inhalation Daily   And   [START ON 06/21/2022] umeclidinium bromide  1 puff Inhalation Daily   ipratropium-albuterol  3 mL Nebulization Q8H   levothyroxine  25 mcg Oral Q0600   pantoprazole (PROTONIX) IV  40 mg Intravenous Q12H   potassium chloride SA  20 mEq Oral BID   roflumilast  500 mcg Oral Daily   sertraline  100 mg Oral QHS   simvastatin  20 mg Oral QHS    Continuous Inpatient Infusions:    PRN Inpatient Medications:  acetaminophen, butalbital-acetaminophen-caffeine, HYDROmorphone (DILAUDID) injection, ondansetron **OR** ondansetron (ZOFRAN) IV, oxyCODONE  Family History: family history includes Alcohol abuse in her father; Breast cancer in her sister; Cancer in her brother, brother, and sister; Coronary artery disease in her mother; Heart disease in her father and mother; Lung cancer in her sister; Pneumonia in her brother; Stroke in her mother.   GI Family History: Negative  Social History:   reports that she has been smoking cigarettes. She has a 22.00 pack-year smoking history. She has never used smokeless tobacco. She reports current drug  use. She reports that she does not drink alcohol. The patient denies ETOH, tobacco, or drug use.    Review of Systems: Review of Systems - Negative except HPI  Physical Examination: BP (!) 101/59 (BP Location: Left Arm)   Pulse 78   Temp 98.4 F (36.9 C) (Oral)   Resp 15   Ht _0  (1.575 m)   Wt 88.5 kg   SpO2 99%   BMI 35.67 kg/m  Physical Exam Constitutional:      General: She is not in acute distress.    Appearance: She is well-developed. She is obese. She is ill-appearing. She is not toxic-appearing.  HENT:     Head: Normocephalic and atraumatic.     Mouth/Throat:     Mouth: Mucous membranes are moist.  Eyes:     Extraocular Movements: Extraocular movements intact.     Pupils: Pupils are equal, round, and reactive to light.  Cardiovascular:  Rate and Rhythm: Normal rate.     Heart sounds: Normal heart sounds. No murmur heard.    No gallop.  Pulmonary:     Effort: Pulmonary effort is normal. No respiratory distress.     Breath sounds: No stridor. No wheezing or rhonchi.  Abdominal:     General: Bowel sounds are normal. There is distension. There is no abdominal bruit. There are no signs of injury.     Palpations: Abdomen is soft. There is no hepatomegaly or mass.     Tenderness: There is abdominal tenderness in the epigastric area. There is no guarding or rebound.     Hernia: No hernia is present.  Skin:    General: Skin is warm and dry.     Capillary Refill: Capillary refill takes less than 2 seconds.  Neurological:     General: No focal deficit present.     Mental Status: She is alert.  Psychiatric:        Mood and Affect: Mood is anxious. Mood is not depressed.        Behavior: Behavior normal.     Data: Lab Results  Component Value Date   WBC 9.8 06/19/2022   HGB 7.9 (L) 06/20/2022   HCT 27.3 (L) 06/20/2022   MCV 86.1 06/19/2022   PLT 306 06/19/2022   Recent Labs  Lab 06/19/22 1113 06/19/22 2031 06/20/22 0809  HGB 7.4* 7.6* 7.9*   Lab  Results  Component Value Date   NA 138 06/20/2022   K 4.2 06/20/2022   CL 101 06/20/2022   CO2 32 06/20/2022   BUN 18 06/20/2022   CREATININE 0.92 06/20/2022   Lab Results  Component Value Date   ALT 18 06/19/2022   AST 21 06/19/2022   ALKPHOS 73 06/19/2022   BILITOT 0.6 06/19/2022   Recent Labs  Lab 06/19/22 1318  INR 1.0      Latest Ref Rng & Units 06/20/2022    8:09 AM 06/19/2022    8:31 PM 06/19/2022   11:13 AM  CBC  WBC 4.0 - 10.5 K/uL   9.8   Hemoglobin 12.0 - 15.0 g/dL 7.9  7.6  7.4   Hematocrit 36.0 - 46.0 % 27.3  26.2  26.0   Platelets 150 - 400 K/uL   306     STUDIES: CT ABDOMEN PELVIS W CONTRAST  Result Date: 06/19/2022 CLINICAL DATA:  Epigastric pain EXAM: CT ABDOMEN AND PELVIS WITH CONTRAST TECHNIQUE: Multidetector CT imaging of the abdomen and pelvis was performed using the standard protocol following bolus administration of intravenous contrast. RADIATION DOSE REDUCTION: This exam was performed according to the departmental dose-optimization program which includes automated exposure control, adjustment of the mA and/or kV according to patient size and/or use of iterative reconstruction technique. CONTRAST:  176m OMNIPAQUE IOHEXOL 300 MG/ML  SOLN COMPARISON:  10/31/2016 FINDINGS: Lower chest: Coronary artery calcifications are seen. Breathing motion limits evaluation of lower lung fields. Hepatobiliary: There are a few scattered small low-density lesions, possibly cysts or hemangiomas. There is interval increase in number of these low-density foci. There is no dilation of bile ducts. Gallbladder is not distended. Pancreas: No focal abnormalities are seen. Spleen: Spleen measures 12.2 cm in maximum diameter. Adrenals/Urinary Tract: There is a 9 mm nodular density in the left adrenal which has not changed significantly. Right adrenal is unremarkable. There is no hydronephrosis. There are a few small bilateral renal stones each measuring less than 4 mm. There is a 1.4 cm  smooth marginated fluid density  lesion in the upper pole of right kidney suggesting renal cyst. There is 8 mm lesion in the medial aspect of right kidney with density measurements ranging up to 92 Hounsfield units, possibly hyperdense hemorrhagic cyst. Ureters are not dilated. Urinary bladder is unremarkable. Stomach/Bowel: Stomach is not distended. Small bowel loops are unremarkable. Appendix is not dilated. There is no significant wall thickening in colon. There is no pericolic stranding. Vascular/Lymphatic: Fairly extensive arterial calcifications are seen. Reproductive: Unremarkable. Other: There is no ascites or pneumoperitoneum. Small bilateral inguinal hernias containing fat are noted. Musculoskeletal: Degenerative changes are noted in lumbar spine, most severe at L2-L3, L4-L5 and L5-S1 levels with encroachment of neural foramina. IMPRESSION: There is no evidence of intestinal obstruction or pneumoperitoneum. Appendix is not dilated. There is no hydronephrosis. There are few small bilateral renal stones. There is 1.4 cm cyst in the upper pole of right kidney. There is 8 mm hyperdense lesion, possibly hemorrhagic cyst in the medial right kidney. There are multiple low-density lesions in liver, possibly cysts or hemangiomas. Lumbar spondylosis. Other findings as described in the body of the report. Electronically Signed   By: Elmer Picker M.D.   On: 06/19/2022 13:11   DG Chest 2 View  Result Date: 06/19/2022 CLINICAL DATA:  Shortness of breath EXAM: CHEST - 2 VIEW COMPARISON:  Radiograph 04/26/2022, chest CT 04/09/2022 FINDINGS: Unchanged cardiomediastinal silhouette with enlarged pulmonary arteries. Mild interstitial opacities. No large pleural effusion. No pneumothorax. No acute osseous abnormality. Thoracic spondylosis. IMPRESSION: Mild interstitial opacities which could reflect mild edema or changes related to emphysema. Electronically Signed   By: Maurine Simmering M.D.   On: 06/19/2022 11:37    _0 @  Assessment:  Epigastric pain. Suspect PUD vs. Gastritis given frequent NSAID use. Anemia presumed secondary to GI blood loss. Chronic anticoagulation - Xarelto - held by Medicine Team yesterday. CHF - no chest pain    Recommendations:  Continue to hold Xarelto EGD tomorrow. The patient understands the nature of the planned procedure, indications, risks, alternatives and potential complications including but not limited to bleeding, infection, perforation, damage to internal organs and possible oversedation/side effects from anesthesia. The patient agrees and gives consent to proceed.  Please refer to procedure notes for findings, recommendations and patient disposition/instructions. Acid suppression.  Thank you for the consult. Please call with questions or concerns.  Olean Ree, "Lanny Hurst MD [facility] [place], [place] [address] [phone]  06/20/2022 9:47 AM

## 2022-06-21 ENCOUNTER — Encounter: Payer: Self-pay | Admitting: Internal Medicine

## 2022-06-21 ENCOUNTER — Inpatient Hospital Stay: Payer: Medicare HMO | Admitting: Anesthesiology

## 2022-06-21 ENCOUNTER — Ambulatory Visit: Payer: Medicare HMO | Admitting: Internal Medicine

## 2022-06-21 ENCOUNTER — Encounter
Admission: EM | Disposition: A | Discharge status: Home / Self Care | Payer: Self-pay | Source: Home / Self Care | Attending: Family Medicine

## 2022-06-21 DIAGNOSIS — D62 Acute posthemorrhagic anemia: Secondary | ICD-10-CM | POA: Diagnosis not present

## 2022-06-21 HISTORY — PX: ESOPHAGOGASTRODUODENOSCOPY: SHX5428

## 2022-06-21 LAB — HEMOGLOBIN AND HEMATOCRIT, BLOOD
HCT: 26.2 % — ABNORMAL LOW (ref 36.0–46.0)
HCT: 26.6 % — ABNORMAL LOW (ref 36.0–46.0)
Hemoglobin: 7.6 g/dL — ABNORMAL LOW (ref 12.0–15.0)
Hemoglobin: 7.8 g/dL — ABNORMAL LOW (ref 12.0–15.0)

## 2022-06-21 LAB — CBC
HCT: 26 % — ABNORMAL LOW (ref 36.0–46.0)
Hemoglobin: 7.5 g/dL — ABNORMAL LOW (ref 12.0–15.0)
MCH: 25.1 pg — ABNORMAL LOW (ref 26.0–34.0)
MCHC: 28.8 g/dL — ABNORMAL LOW (ref 30.0–36.0)
MCV: 87 fL (ref 80.0–100.0)
Platelets: 224 10*3/uL (ref 150–400)
RBC: 2.99 MIL/uL — ABNORMAL LOW (ref 3.87–5.11)
RDW: 18 % — ABNORMAL HIGH (ref 11.5–15.5)
WBC: 6.5 10*3/uL (ref 4.0–10.5)
nRBC: 0 % (ref 0.0–0.2)

## 2022-06-21 LAB — PHOSPHORUS: Phosphorus: 3.2 mg/dL (ref 2.5–4.6)

## 2022-06-21 LAB — BASIC METABOLIC PANEL
Anion gap: 5 (ref 5–15)
BUN: 10 mg/dL (ref 8–23)
CO2: 29 mmol/L (ref 22–32)
Calcium: 9.1 mg/dL (ref 8.9–10.3)
Chloride: 103 mmol/L (ref 98–111)
Creatinine, Ser: 0.72 mg/dL (ref 0.44–1.00)
GFR, Estimated: >60 mL/min (ref >60)
Glucose, Bld: 94 mg/dL (ref 70–99)
Potassium: 4.3 mmol/L (ref 3.5–5.1)
Sodium: 137 mmol/L (ref 135–145)

## 2022-06-21 LAB — PREPARE RBC (CROSSMATCH)

## 2022-06-21 LAB — MAGNESIUM: Magnesium: 2.3 mg/dL (ref 1.7–2.4)

## 2022-06-21 SURGERY — EGD (ESOPHAGOGASTRODUODENOSCOPY)
Anesthesia: General

## 2022-06-21 MED ORDER — SODIUM CHLORIDE 0.9 % IV SOLN: INTRAVENOUS | Status: DC

## 2022-06-21 MED ORDER — LIDOCAINE HCL (CARDIAC) PF 100 MG/5ML IV SOSY
PREFILLED_SYRINGE | INTRAVENOUS | Status: DC | PRN
  Administered 2022-06-21: 100 mg via INTRAVENOUS

## 2022-06-21 MED ORDER — GLYCOPYRROLATE 0.2 MG/ML IJ SOLN
INTRAMUSCULAR | Status: DC | PRN
  Administered 2022-06-21: .2 mg via INTRAVENOUS

## 2022-06-21 MED ORDER — PANTOPRAZOLE SODIUM 40 MG PO TBEC
40.0000 mg | DELAYED_RELEASE_TABLET | Freq: Every day | ORAL | Status: DC
  Administered 2022-06-21 – 2022-06-22 (×2): 40 mg via ORAL
  Filled 2022-06-21 (×2): qty 1

## 2022-06-21 MED ORDER — PROPOFOL 500 MG/50ML IV EMUL
INTRAVENOUS | Status: DC | PRN
  Administered 2022-06-21: 145 ug/kg/min via INTRAVENOUS

## 2022-06-21 MED ORDER — SODIUM CHLORIDE 0.9% IV SOLUTION
Freq: Once | INTRAVENOUS | Status: AC
  Administered 2022-06-21: 10 mL via INTRAVENOUS

## 2022-06-21 MED ORDER — PROPOFOL 10 MG/ML IV BOLUS
INTRAVENOUS | Status: DC | PRN
  Administered 2022-06-21: 20 mg via INTRAVENOUS
  Administered 2022-06-21: 60 mg via INTRAVENOUS

## 2022-06-21 NOTE — Anesthesia Procedure Notes (Signed)
Procedure Name: General with mask airway Date/Time: 06/21/2022 11:31 AM  Performed by: Kelton Pillar, CRNAPre-anesthesia Checklist: Patient identified, Emergency Drugs available, Suction available and Patient being monitored Patient Re-evaluated:Patient Re-evaluated prior to induction Oxygen Delivery Method: Simple face mask Induction Type: IV induction Placement Confirmation: positive ETCO2, CO2 detector and breath sounds checked- equal and bilateral Dental Injury: Teeth and Oropharynx as per pre-operative assessment

## 2022-06-21 NOTE — Anesthesia Preprocedure Evaluation (Signed)
Anesthesia Evaluation  Patient identified by MRN, date of birth, ID band Patient awake    Reviewed: Allergy & Precautions, H&P , NPO status , Patient's Chart, lab work & pertinent test results, reviewed documented beta blocker date and time   Airway Mallampati: II   Neck ROM: full    Dental  (+) Poor Dentition   Pulmonary shortness of breath, sleep apnea and Continuous Positive Airway Pressure Ventilation , pneumonia, resolved, COPD,  COPD inhaler, Current Smoker,    Pulmonary exam normal        Cardiovascular Exercise Tolerance: Poor hypertension, On Medications +CHF  Normal cardiovascular exam Rhythm:regular Rate:Normal     Neuro/Psych PSYCHIATRIC DISORDERS Anxiety Depression  Neuromuscular disease    GI/Hepatic Neg liver ROS, GERD  Medicated,  Endo/Other  Hypothyroidism   Renal/GU Renal disease  negative genitourinary   Musculoskeletal   Abdominal   Peds  Hematology  (+) Blood dyscrasia, anemia ,   Anesthesia Other Findings Past Medical History: 10/03/2017: Acute postoperative pain No date: Anxiety No date: BiPAP (biphasic positive airway pressure) dependence     Comment:  at hs No date: Carpal tunnel syndrome No date: CHF (congestive heart failure) (HCC)     Comment:  1/18 No date: CHF (congestive heart failure) (HCC) No date: Chronic generalized abdominal pain No date: Chronic rhinitis No date: COPD (chronic obstructive pulmonary disease) (HCC) No date: DDD (degenerative disc disease), cervical No date: DDD (degenerative disc disease), lumbosacral No date: Depression No date: Dyspnea No date: Edema No date: Flu     Comment:  1/18 No date: Gastritis No date: GERD (gastroesophageal reflux disease) No date: Hematuria 07/17/2018: Hernia of abdominal wall No date: Hernia, abdominal No date: Hypertension No date: Kidney stones No date: Low back pain No date: Lumbar radiculitis No date: Malodorous  urine No date: Muscle weakness No date: Obesity No date: Oxygen dependent 02/03/2020: Pneumonia due to COVID-19 virus No date: Renal cyst No date: Sensory urge incontinence No date: Thyroid activity decreased     Comment:  9/19 No date: Tobacco abuse No date: Wheezing Past Surgical History: No date: ABDOMINAL HYSTERECTOMY 04/29/2022: CARDIOVERSION; N/A     Comment:  Procedure: CARDIOVERSION;  Surgeon: Corey Skains,               MD;  Location: [facility] ORS;  Service: Cardiovascular;                Laterality: N/A; 2012: CARPAL TUNNEL RELEASE; Left 08/13/2018: EPIGASTRIC HERNIA REPAIR; N/A     Comment:  HERNIA REPAIR EPIGASTRIC ADULT; polypropylene mesh               reinforcement.  Surgeon: Robert Bellow, MD;                Location: [facility] ORS;  Service: General;  Laterality: N/A; 07/11/2019: RIGHT/LEFT HEART CATH AND CORONARY ANGIOGRAPHY; N/A     Comment:  Procedure: RIGHT/LEFT HEART CATH AND CORONARY               ANGIOGRAPHY;  Surgeon: Yolonda Kida, MD;  Location:              [facility];  Service: Cardiovascular;                Laterality: N/A; 04/29/2022: TEE WITHOUT CARDIOVERSION; N/A     Comment:  Procedure: TRANSESOPHAGEAL ECHOCARDIOGRAM (TEE);                Surgeon: Corey Skains, MD;  Location: [facility] ORS;                Service: Cardiovascular;  Laterality: N/A; No date: TONSILLECTOMY No date: TUBAL LIGATION 28/20/6015: UMBILICAL HERNIA REPAIR; N/A     Comment:  HERNIA REPAIR UMBILICAL ADULT; polypropylene mesh               reinforcement Surgeon: Robert Bellow, MD;  Location:              [facility] ORS;  Service: General;  Laterality: N/A; BMI    Body Mass Index: 35.67 kg/m     Reproductive/Obstetrics negative OB ROS                             Anesthesia Physical Anesthesia Plan  ASA: 3  Anesthesia Plan: General   Post-op Pain Management:    Induction:   PONV Risk Score and Plan:   Airway Management  Planned:   Additional Equipment:   Intra-op Plan:   Post-operative Plan:   Informed Consent: I have reviewed the patients History and Physical, chart, labs and discussed the procedure including the risks, benefits and alternatives for the proposed anesthesia with the patient or authorized representative who has indicated his/her understanding and acceptance.     Dental Advisory Given  Plan Discussed with: CRNA  Anesthesia Plan Comments:         Anesthesia Quick Evaluation

## 2022-06-21 NOTE — Op Note (Signed)
[facility] Patient Name: Theresa [name] Procedure Date: 06/21/2022 10:56 AM MRN: [id number] Account #: [id number] Date of Birth: 1956-04-05 Admit Type: Inpatient Age: 66 Room: [facility] ENDO ROOM 3 Gender: Female Note Status: Finalized Instrument Name: Upper Endoscope [id number] Procedure:             Upper GI endoscopy Indications:           Epigastric abdominal pain, Unexplained iron deficiency                         anemia Providers:             Benay Pike. Alice Reichert MD, MD Referring MD:          Kirstie Peri. Caryn Section, MD (Referring MD) Medicines:             Propofol per Anesthesia Complications:         No immediate complications. Procedure:             Pre-Anesthesia Assessment:                        - The risks and benefits of the procedure and the                         sedation options and risks were discussed with the                         patient. All questions were answered and informed                         consent was obtained.                        - Patient identification and proposed procedure were                         verified prior to the procedure by the nurse. The                         procedure was verified in the procedure room.                        - ASA Grade Assessment: III - A patient with severe                         systemic disease.                        - After reviewing the risks and benefits, the patient                         was deemed in satisfactory condition to undergo the                         procedure.                        After obtaining informed consent, the endoscope was                         passed under  direct vision. Throughout the procedure,                         the patient's blood pressure, pulse, and oxygen                         saturations were monitored continuously. The Endoscope                         was introduced through the mouth, and advanced to the                          third part of duodenum. The upper GI endoscopy was                         accomplished without difficulty. The patient tolerated                         the procedure well. Findings:      The esophagus was normal.      Patchy minimal inflammation characterized by erythema was found in the       gastric antrum. Biopsies were taken with a cold forceps for Helicobacter       pylori testing.      Two non-bleeding superficial duodenal ulcers with no stigmata of       bleeding were found in the second portion of the duodenum. The largest       lesion was 5 mm in largest dimension.      The exam of the stomach was otherwise normal. Impression:            - Normal esophagus.                        - Gastritis. Biopsied.                        - Non-bleeding duodenal ulcers with no stigmata of                         bleeding. Recommendation:        - Advance diet as tolerated.                        - Await pathology results.                        - Use Protonix (pantoprazole) 40 mg PO daily.                        - [employer] will sign off for now. Call us back if any                         changes clinically or if we can help for any reason. Procedure Code(s):     --- Professional ---                        [phone], Esophagogastroduodenoscopy, flexible,                         transoral; with biopsy, single or multiple  Diagnosis Code(s):     --- Professional ---                        D50.9, Iron deficiency anemia, unspecified                        R10.13, Epigastric pain                        K26.9, Duodenal ulcer, unspecified as acute or                         chronic, without hemorrhage or perforation                        K29.70, Gastritis, unspecified, without bleeding CPT copyright 2019 American Medical Association. All rights reserved. The codes documented in this report are preliminary and upon coder review may  be revised to meet current compliance requirements. Efrain Sella  MD, MD 06/21/2022 11:23:27 AM This report has been signed electronically. Number of Addenda: 0 Note Initiated On: 06/21/2022 10:56 AM Estimated Blood Loss:  Estimated blood loss: none.      [facility]

## 2022-06-21 NOTE — Transfer of Care (Signed)
Immediate Anesthesia Transfer of Care Note  Patient: Theresa [name]  Procedure(s) Performed: ESOPHAGOGASTRODUODENOSCOPY (EGD)  Patient Location: Endoscopy Unit  Anesthesia Type:General  Level of Consciousness: drowsy and patient cooperative  Airway & Oxygen Therapy: Patient Spontanous Breathing and Patient connected to face mask oxygen  Post-op Assessment: Report given to RN and Post -op Vital signs reviewed and stable  Post vital signs: Reviewed and stable  Last Vitals:  Vitals Value Taken Time  BP 89/47 06/21/22 1124  Temp 36.2 C 06/21/22 1124  Pulse 90 06/21/22 1128  Resp 13 06/21/22 1128  SpO2 91 % 06/21/22 1128  Vitals shown include unvalidated device data.  Last Pain:  Vitals:   06/21/22 1124  TempSrc: Temporal  PainSc: 0-No pain         Complications: No notable events documented.

## 2022-06-21 NOTE — Progress Notes (Signed)
PROGRESS NOTE    Theresa [name]  LMB:[id number] DOB: 22-Oct-1955 DOA: 06/19/2022  PCP: Birdie Sons, MD   Brief Narrative:  This 66 years old female with PMH significant for chronic diastolic CHF, pulmonary hypertension, COPD on 2.5 L of supplemental oxygen at baseline, GERD, hypothyroidism, depression, nicotine dependent, obstructive sleep apnea, chronic pain syndrome, obesity presented in the ED for the evaluation of worsening shortness of breath mostly on exertion over the last several days as well as abdominal pain.  Patient reports abdominal pain is progressive and getting worse and has developed coffee-ground emesis x1.  She denies any NSAIDs use.  Labs show significant drop in her hemoglobin from 13.5-7.4 over the last 1 month.  She is also found to have hypokalemia.  Patient is admitted for GI bleed and started on IV pantoprazole.  GI is consulted and she underwent EGD, found to have gastritis, nonbleeding duodenal ulcers.  GI recommended pantoprazole 40 mg daily.  Assessment & Plan:   Principal Problem:   ABLA (acute blood loss anemia) Active Problems:   Essential (primary) hypertension   Pulmonary arterial hypertension (HCC)   Hypothyroidism, unspecified   Chronic respiratory failure with hypoxia (HCC)   CHF (congestive heart failure) (HCC)   Paroxysmal atrial fibrillation (HCC)  Acute on chronic blood loss anemia: Patient presented for the evaluation of abdominal pain mostly in the epigastric and periumbilical area associated with an episode of coffee-ground emesis. She is found to have significant hemoglobin drop from 13.5>7.4 over the period of 4 weeks. She also complains of having exertional shortness of breath that may be related to symptomatic anemia.   Likely cause could be peptic ulcer disease. Hold Xarelto and aspirin. Continue pantoprazole 40 mg IV every 12 hours. GI is consulted , underwent EGD found to have gastritis and nonbleeding duodenal ulcers. H&H remains  stable after 1 unit PRBC.  7.4> 7.9> 7.8 Transfuse if hemoglobin drops below 7. GI recommended pantoprazole 40 mg daily. Monitor overnight, anticipated discharge tomorrow.  Essential hypertension: Hold Cardizem and metoprolol since blood pressure is on the soft side.  Pulmonary artery hypertension: Hold medications as blood pressure is soft.  Hypothyroidism: Continue Synthroid 25 mcg daily  Paroxysmal atrial fibrillation: Continue amiodarone for rate control. Hold metoprolol and Cardizem since patient has soft blood pressure Resume Xarelto and aspirin tomorrow.  Chronic diastolic CHF: Not in any acute exacerbation. Last known LVEF 60 to 65%  was July 2023. Hold metolazone and torsemide as blood pressure is on the soft side.   Hold metoprolol for now  Chronic hypoxic respiratory failure secondary to COPD. Continue supplemental oxygen 2.5 L at baseline.  Nicotine dependence: Smoking cessation discussed in detail.   Patient refuses to use nicotine patch at this time.   Not interested in quit smoking.  Obesity: Diet and exercise discussed in detail. Estimated body mass index is 35.67 kg/m as calculated from the following:   Height as of this encounter: _0  (1.575 m).   Weight as of this encounter: 88.5 kg.    DVT prophylaxis: SCDs Code Status: Full code. Family Communication: Son at bed side. Disposition Plan:  Status is: Inpatient Remains inpatient appropriate because: Admitted for abdominal pain and coffee-ground emesis.  H&H has significantly dropped from 13.5-7.4 in a period of 4 weeks.  S/p 1 PRBC hemoglobin remained 7.9.  GI is consulted, underwent EGD found to have gastritis and nonbleeding duodenal ulcer.  GI recommended pantoprazole 40 mg daily.  Anticipated discharge home 06/22/2022.    Consultants:  Gastroenterology  Procedures: Scheduled EGD 06/21/2022 Antimicrobials: None  Subjective: Patient was seen and examined at bedside.  Overnight events  noted. Patient reports feeling better,  She underwent EGD found to have gastritis. Patient denies any further bleeding.   Objective: Vitals:   06/21/22 1124 06/21/22 1134 06/21/22 1144 06/21/22 1242  BP: (!) 89/47 105/62 111/65 (!) 112/56  Pulse: 93 94 92 96  Resp: 20 (!) 22 (!) 22 18  Temp: (!) 97.1 F (36.2 C)   98.6 F (37 C)  TempSrc: Temporal   Oral  SpO2: 96% (!) 88% 92% 100%  Weight:      Height:        Intake/Output Summary (Last 24 hours) at 06/21/2022 1339 Last data filed at 06/21/2022 1128 Gross per 24 hour  Intake 400 ml  Output 900 ml  Net -500 ml   Filed Weights   06/19/22 1111 06/21/22 1035  Weight: 88.5 kg 88.5 kg    Examination:  General exam: Appears comfortable, not in any acute distress. Respiratory system: CTA bilaterally, respiratory effort normal, RR 15 Cardiovascular system: S1 & S2 heard, regular rate and rhythm, no murmur. Gastrointestinal system: Abdomen is soft, mildly tender, non distended, BS+. Central nervous system: Alert and oriented x 3. No focal neurological deficits. Extremities: No edema, no cyanosis, no clubbing. Skin: No rashes, lesions or ulcers Psychiatry: Judgement and insight appear normal. Mood & affect appropriate.     Data Reviewed: I have personally reviewed following labs and imaging studies  CBC: Recent Labs  Lab 06/19/22 1113 06/19/22 2031 06/20/22 0809 06/20/22 2034 06/21/22 0417 06/21/22 0901  WBC 9.8  --   --   --  6.5  --   HGB 7.4* 7.6* 7.9* 7.6* 7.5* 7.8*  HCT 26.0* 26.2* 27.3* 26.3* 26.0* 26.6*  MCV 86.1  --   --   --  87.0  --   PLT 306  --   --   --  224  --    Basic Metabolic Panel: Recent Labs  Lab 06/19/22 1113 06/19/22 1401 06/20/22 0809 06/21/22 0417  NA 133*  --  138 137  K 2.9*  --  4.2 4.3  CL 91*  --  101 103  CO2 32  --  32 29  GLUCOSE 99  --  102* 94  BUN 22  --  18 10  CREATININE 0.91  --  0.92 0.72  CALCIUM 9.3  --  9.4 9.1  MG  --  2.7*  --  2.3  PHOS  --   --   --  3.2    GFR: Estimated Creatinine Clearance: 72.5 mL/min (by C-G formula based on SCr of 0.72 mg/dL). Liver Function Tests: Recent Labs  Lab 06/19/22 1113  AST 21  ALT 18  ALKPHOS 73  BILITOT 0.6  PROT 7.3  ALBUMIN 3.9   Recent Labs  Lab 06/19/22 1113  LIPASE 57*   No results for input(s): "AMMONIA" in the last 168 hours. Coagulation Profile: Recent Labs  Lab 06/19/22 1318  INR 1.0   Cardiac Enzymes: No results for input(s): "CKTOTAL", "CKMB", "CKMBINDEX", "TROPONINI" in the last 168 hours. BNP (last 3 results) No results for input(s): "PROBNP" in the last 8760 hours. HbA1C: No results for input(s): "HGBA1C" in the last 72 hours. CBG: No results for input(s): "GLUCAP" in the last 168 hours. Lipid Profile: No results for input(s): "CHOL", "HDL", "LDLCALC", "TRIG", "CHOLHDL", "LDLDIRECT" in the last 72 hours. Thyroid Function Tests: No results for input(s): "TSH", "T4TOTAL", "  FREET4", "T3FREE", "THYROIDAB" in the last 72 hours. Anemia Panel: Recent Labs    06/19/22 1201  TIBC 514*  IRON 17*   Sepsis Labs: No results for input(s): "PROCALCITON", "LATICACIDVEN" in the last 168 hours.  No results found for this or any previous visit (from the past 240 hour(s)).   Radiology Studies: No results found.  Scheduled Meds:  amiodarone  200 mg Oral Daily   busPIRone  5 mg Oral BID   fluticasone furoate-vilanterol  1 puff Inhalation Daily   And   umeclidinium bromide  1 puff Inhalation Daily   ipratropium-albuterol  3 mL Nebulization Q8H   levothyroxine  25 mcg Oral Q0600   pantoprazole  40 mg Oral Daily   potassium chloride SA  20 mEq Oral BID   roflumilast  500 mcg Oral Daily   sertraline  100 mg Oral QHS   simvastatin  20 mg Oral QHS   Continuous Infusions:   LOS: 2 days    Time spent: 35 mins    Orval Dortch, MD [facility]   If 7PM-7AM, please contact night-coverage

## 2022-06-21 NOTE — Interval H&P Note (Signed)
History and Physical Interval Note:  06/21/2022 11:04 AM  Theresa [name]  has presented today for surgery, with the diagnosis of Epigastric pain, anemia secondary to blood loss.  The various methods of treatment have been discussed with the patient and family. After consideration of risks, benefits and other options for treatment, the patient has consented to  Procedure(s): ESOPHAGOGASTRODUODENOSCOPY (EGD) (N/A) as a surgical intervention.  The patient's history has been reviewed, patient examined, no change in status, stable for surgery.  I have reviewed the patient's chart and labs.  Questions were answered to the patient's satisfaction.     [place], [place]

## 2022-06-21 NOTE — Progress Notes (Signed)
Mobility Specialist - Progress Note   06/21/22 1300  Mobility  Activity Refused mobility  $Mobility charge 1 Mobility   Patient declined mobility, no reason specified. Will attempt at another date and time.   [name]  Mobility Specialist  06/21/22 1:18 PM

## 2022-06-21 NOTE — TOC Initial Note (Signed)
Transition of Care [facility]) - Initial/Assessment Note    Patient Details  Name: Theresa [name] MRN: [id number] Date of Birth: 1956-03-16  Transition of Care [facility]) CM/SW Contact:    Candie Chroman, LCSW Phone Number: 06/21/2022, 12:55 PM  Clinical Narrative:   Readmission prevention screen complete. CSW met with patient. No supports at bedside. CSW introduced role and explained that discharge planning would be discussed. PCP is Lelon Huh, MD but she is moving to [facility]. She was supposed to have an appointment today. Husband transports her to appointments. Pharmacy is [facility]. No issues obtaining medications. No home health prior to admission. She has a wheelchair that she uses as needed and for long distances. She is on home oxygen through [place]. No further concerns. CSW encouraged patient to contact CSW as needed. CSW will continue to follow patient for support and facilitate return home once stable. Husband will transport at discharge.               Expected Discharge Plan: Home/Self Care Barriers to Discharge: Continued Medical Work up   Patient Goals and CMS Choice        Expected Discharge Plan and Services Expected Discharge Plan: Home/Self Care     Post Acute Care Choice: NA Living arrangements for the past 2 months: Single Family Home                                      Prior Living Arrangements/Services Living arrangements for the past 2 months: Single Family Home Lives with:: Spouse Patient language and need for interpreter reviewed:: Yes Do you feel safe going back to the place where you live?: Yes      Need for Family Participation in Patient Care: Yes (Comment) Care giver support system in place?: Yes (comment) Current home services: DME Criminal Activity/Legal Involvement Pertinent to Current Situation/Hospitalization: No - Comment as needed  Activities of Daily Living Home Assistive Devices/Equipment: Oxygen, BIPAP, Nebulizer,  Eyeglasses ADL Screening (condition at time of admission) Patient's cognitive ability adequate to safely complete daily activities?: Yes Is the patient deaf or have difficulty hearing?: No Does the patient have difficulty seeing, even when wearing glasses/contacts?: No Does the patient have difficulty concentrating, remembering, or making decisions?: No Patient able to express need for assistance with ADLs?: Yes Does the patient have difficulty dressing or bathing?: No Independently performs ADLs?: Yes (appropriate for developmental age) Does the patient have difficulty walking or climbing stairs?: No Weakness of Legs: None Weakness of Arms/Hands: None  Permission Sought/Granted                  Emotional Assessment Appearance:: Appears stated age Attitude/Demeanor/Rapport: Engaged, Gracious Affect (typically observed): Accepting, Appropriate, Calm, Pleasant Orientation: : Oriented to Self, Oriented to Place, Oriented to  Time, Oriented to Situation Alcohol / Substance Use: Not Applicable Psych Involvement: No (comment)  Admission diagnosis:  Abdominal pain, epigastric [R10.13] Hypokalemia [E87.6] Gastrointestinal hemorrhage, unspecified gastrointestinal hemorrhage type [K92.2] Anemia, unspecified type [D64.9] ABLA (acute blood loss anemia) [D62] Patient Active Problem List   Diagnosis Date Noted   ABLA (acute blood loss anemia) 06/19/2022   CHF (congestive heart failure) (HCC)    Paroxysmal atrial fibrillation (HCC)    Anxiety 05/05/2022   Atrial flutter with rapid ventricular response ([place]) 04/26/2022   Hyperlipidemia, unspecified 04/25/2022   COPD exacerbation ([place]) 04/24/2022   Abnormal MRI, cervical spine 04/11/2022  Aphasia 04/08/2022   Degenerative arthritis of cervical spine 04/07/2022   Pulmonary hypertension (HCC) 04/01/2022   BiPAP (biphasic positive airway pressure) dependence    Acute on chronic diastolic CHF (congestive heart failure) (HCC)     Hypothyroidism, unspecified    Depression with anxiety    Chronic pain    COPD with acute exacerbation ([place]) 08/06/2021   Chronic shoulder pain (Bilateral) 09/02/3566   Uncomplicated opioid dependence ([place]) 04/08/2020   Altered consciousness 11/26/2019   Lumbar disc extrusion (L2-3) (Left) 09/10/2019   Chronic knee pain (Left) 08/14/2019   Chronic left-sided lumbar radiculopathy 08/14/2019   Abnormal MRI, lumbar spine (07/23/2019) 08/14/2019   Acute on chronic respiratory failure with hypoxia and hypercapnia (HCC) 07/06/2019   Cervicalgia (Bilateral) (R>L) 09/24/2018   Spondylosis without myelopathy or radiculopathy, lumbosacral region 09/24/2018   Chronic lower extremity pain (Bilateral) (L>R) 01/25/2018   Altered mental status, unspecified 12/09/2017   Chronic hip pain (3ry area of Pain) (Bilateral) (L>R) 11/27/2017   COPD (chronic obstructive pulmonary disease) ([place]) 08/11/2017   Disorder of bone, unspecified 08/08/2017   COPD with hypoxia ([place]) 06/27/2017   Arthralgia of acromioclavicular joint (Right) 06/15/2017   Acromioclavicular joint DJD (Right) 06/15/2017   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   Chronic shoulder pain (Left) 05/11/2017   Lumbar facet hypertrophy (multilevel) 11/17/2016   Lumbar spinal stenosis (L4-5) 11/17/2016   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) 11/17/2016   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) 11/17/2016   Lumbosacral radiculopathy at L4 11/17/2016   Supplemental oxygen dependent 08/01/2016   Sleep apnea 06/02/2016   Lumbar facet syndrome (Bilateral) (L>R) 01/12/2016   Chronic sacroiliac joint pain (Bilateral) 01/12/2016   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) 12/28/2015   Pulmonary arterial hypertension ([place]) 11/19/2015   Coagulation disorder ([place]) 10/22/2015   Chronic pain syndrome (significant psychosocial component) 09/21/2015   Encounter for chronic pain management 09/16/2015   Lumbar spondylosis (L4-5)  09/16/2015   Chronic low back pain (1ry area of Pain) (Bilateral) (L>R) w/o sciatica 09/16/2015   Chronic lower extremity pain (2ry area of Pain) (Left) 09/16/2015   Chronic lumbar radicular pain (L4 & S1 Dermatome) (Left) 09/16/2015   Chronic shoulder pain (Right side) 09/16/2015   Chronic upper extremity pain (Right-sided) 09/16/2015   Cervical radiculitis (Right side) 09/16/2015   Abnormal x-ray of lumbar spine 09/16/2015   Chronic diastolic CHF (congestive heart failure) ([place]) 07/15/2015   Chest pain 07/15/2015   Chronic respiratory failure with hypoxia ([place]) 04/28/2015   Tendinitis 04/21/2015   Blood clotting disorder ([place]) 04/21/2015   Chronic rhinitis 07/16/2014   Carpal tunnel syndrome 07/16/2014   Anxiety state 07/16/2014   Neuritis or radiculitis due to rupture of lumbar intervertebral disc 07/02/2014   DDD (degenerative disc disease), lumbar 07/02/2014   CAFL (chronic airflow limitation) ([place]) 02/27/2014   Essential (primary) hypertension 03/27/2008   PCP:  Birdie Sons, MD Pharmacy:   [facility], [address] [place] [place] [address] Phone: [phone] Fax: [phone]     Social Determinants of Health (SDOH) Interventions    Readmission Risk Interventions    06/21/2022   12:54 PM 08/09/2021   10:43 AM 02/05/2020    9:53 AM  Readmission Risk Prevention Plan  Transportation Screening Complete Complete Complete  PCP or Specialist Appt within 3-5 Days  Complete Complete  HRI or Home Care Consult  Complete Complete  Social Work Consult for [place] Planning/Counseling  Complete   Palliative Care  Screening  Not Applicable   Medication Review (RN Care Manager) Complete Complete Complete  PCP or Specialist appointment within 3-5 days of discharge Complete    SW Recovery Care/Counseling Consult Complete    [place] Not Applicable

## 2022-06-21 NOTE — Plan of Care (Signed)
  Problem: Education: Goal: Ability to identify signs and symptoms of gastrointestinal bleeding will improve Outcome: Progressing   Problem: Bowel/Gastric: Goal: Will show no signs and symptoms of gastrointestinal bleeding Outcome: Progressing   Problem: Clinical Measurements: Goal: Complications related to the disease process, condition or treatment will be avoided or minimized Outcome: Progressing

## 2022-06-22 ENCOUNTER — Other Ambulatory Visit: Payer: Self-pay | Admitting: Family

## 2022-06-22 ENCOUNTER — Encounter: Payer: Self-pay | Admitting: Internal Medicine

## 2022-06-22 DIAGNOSIS — J9610 Chronic respiratory failure, unspecified whether with hypoxia or hypercapnia: Secondary | ICD-10-CM | POA: Diagnosis not present

## 2022-06-22 DIAGNOSIS — D62 Acute posthemorrhagic anemia: Secondary | ICD-10-CM | POA: Diagnosis not present

## 2022-06-22 LAB — BPAM RBC
Blood Product Expiration Date: 202310032359
Blood Product Expiration Date: 202310042359
ISSUE DATE / TIME: 202309031620
ISSUE DATE / TIME: 202309051839
Unit Type and Rh: 6200
Unit Type and Rh: 6200

## 2022-06-22 LAB — HEMOGLOBIN AND HEMATOCRIT, BLOOD
HCT: 27.4 % — ABNORMAL LOW (ref 36.0–46.0)
HCT: 29 % — ABNORMAL LOW (ref 36.0–46.0)
Hemoglobin: 8 g/dL — ABNORMAL LOW (ref 12.0–15.0)
Hemoglobin: 8.4 g/dL — ABNORMAL LOW (ref 12.0–15.0)

## 2022-06-22 LAB — TYPE AND SCREEN
ABO/RH(D): A POS
Antibody Screen: NEGATIVE
Unit division: 0
Unit division: 0

## 2022-06-22 LAB — SURGICAL PATHOLOGY

## 2022-06-22 MED ORDER — PANTOPRAZOLE SODIUM 40 MG PO TBEC: 40.0000 mg | DELAYED_RELEASE_TABLET | Freq: Every day | ORAL | 2 refills | Status: DC

## 2022-06-22 MED ORDER — HYDROCODONE-ACETAMINOPHEN 5-325 MG PO TABS: 1.0000 | ORAL_TABLET | Freq: Three times a day (TID) | ORAL | Status: DC | PRN

## 2022-06-22 NOTE — TOC Transition Note (Signed)
Transition of Care [facility]) - CM/SW Discharge Note   Patient Details  Name: Theresa [name] MRN: [id number] Date of Birth: June 12, 1956  [facility]) CM/SW Contact:  Candie Chroman, LCSW Phone Number: 06/22/2022, 12:19 PM   Clinical Narrative:  Patient has orders to discharge home today. No further concerns. CSW signing off.   Final next level of care: Home/Self Care Barriers to Discharge: Barriers Resolved   Patient Goals and CMS Choice        Discharge Placement                Patient to be transferred to facility by: Husband   Patient and family notified of of transfer: 06/22/22  Discharge Plan and Services     Post Acute Care Choice: NA                               Social Determinants of Health (SDOH) Interventions     Readmission Risk Interventions    06/21/2022   12:54 PM 08/09/2021   10:43 AM 02/05/2020    9:53 AM  Readmission Risk Prevention Plan  Transportation Screening Complete Complete Complete  PCP or Specialist Appt within 3-5 Days  Complete Complete  HRI or [place]  Complete Complete  Social Work Consult for [place] Planning/Counseling  Complete   Palliative Care Screening  Not Applicable   Medication Review Press photographer) Complete Complete Complete  PCP or Specialist appointment within 3-5 days of discharge Complete    SW Recovery Care/Counseling Consult Complete    [place] Not Applicable

## 2022-06-22 NOTE — Plan of Care (Signed)
  Problem: Education: Goal: Knowledge of General Education information will improve Description: Including pain rating scale, medication(s)/side effects and non-pharmacologic comfort measures Outcome: Progressing   Problem: Health Behavior/Discharge Planning: Goal: Ability to manage health-related needs will improve Outcome: Progressing   Problem: Clinical Measurements: Goal: Ability to maintain clinical measurements within normal limits will improve Outcome: Progressing Goal: Will remain free from infection Outcome: Progressing Goal: Diagnostic test results will improve Outcome: Progressing Goal: Respiratory complications will improve Outcome: Progressing Goal: Cardiovascular complication will be avoided Outcome: Progressing   Problem: Activity: Goal: Risk for activity intolerance will decrease Outcome: Progressing   Problem: Nutrition: Goal: Adequate nutrition will be maintained Outcome: Progressing   Problem: Coping: Goal: Level of anxiety will decrease Outcome: Progressing   Problem: Elimination: Goal: Will not experience complications related to bowel motility Outcome: Progressing Goal: Will not experience complications related to urinary retention Outcome: Progressing   Problem: Pain Managment: Goal: General experience of comfort will improve Outcome: Progressing   Problem: Safety: Goal: Ability to remain free from injury will improve Outcome: Progressing   Problem: Skin Integrity: Goal: Risk for impaired skin integrity will decrease Outcome: Progressing   Problem: Education: Goal: Ability to identify signs and symptoms of gastrointestinal bleeding will improve Outcome: Progressing   Problem: Bowel/Gastric: Goal: Will show no signs and symptoms of gastrointestinal bleeding Outcome: Progressing   Problem: Clinical Measurements: Goal: Complications related to the disease process, condition or treatment will be avoided or minimized Outcome:  Progressing

## 2022-06-22 NOTE — Progress Notes (Signed)
Mobility Specialist - Progress Note   06/22/22 1100  Mobility  Activity Refused mobility  $Mobility charge 1 Mobility   Patient declined mobility, patient did not sleep well and had gotten up prior to [name] arrival. Will attempt at another date and time.   [name]  Mobility Specialist  06/22/22 11:04 AM

## 2022-06-22 NOTE — Hospital Course (Signed)
Taken from prior notes.  This 66 years old female with PMH significant for chronic diastolic CHF, pulmonary hypertension, COPD on 2.5 L of supplemental oxygen at baseline, GERD, hypothyroidism, depression, nicotine dependent, obstructive sleep apnea, chronic pain syndrome, obesity presented in the ED for the evaluation of worsening shortness of breath mostly on exertion over the last several days as well as abdominal pain.  Patient reports abdominal pain is progressive and getting worse and has developed coffee-ground emesis x1.  She denies any NSAIDs use.  Labs show significant drop in her hemoglobin from 13.5-7.4 over the last 1 month.  She is also found to have hypokalemia.  Patient is admitted for GI bleed and started on IV pantoprazole.  GI is consulted and she underwent EGD, found to have gastritis, nonbleeding duodenal ulcers.  GI recommended pantoprazole 40 mg daily. Patient received 2 unit of PRBC.

## 2022-06-22 NOTE — Anesthesia Postprocedure Evaluation (Signed)
Anesthesia Post Note  Patient: Theresa [name]  Procedure(s) Performed: ESOPHAGOGASTRODUODENOSCOPY (EGD)  Patient location during evaluation: PACU Anesthesia Type: General Level of consciousness: awake and alert Pain management: pain level controlled Vital Signs Assessment: post-procedure vital signs reviewed and stable Respiratory status: spontaneous breathing, nonlabored ventilation, respiratory function stable and patient connected to nasal cannula oxygen Cardiovascular status: blood pressure returned to baseline and stable Postop Assessment: no apparent nausea or vomiting Anesthetic complications: no   No notable events documented.   Last Vitals:  Vitals:   06/22/22 0503 06/22/22 0926  BP: (!) 111/51 (!) 114/59  Pulse: 81 93  Resp: 17 16  Temp: 36.6 C 36.8 C  SpO2: 100% 99%    Last Pain:  Vitals:   06/22/22 0926  TempSrc: (P) Oral  PainSc:                  Molli Barrows

## 2022-06-22 NOTE — Care Management Important Message (Signed)
Important Message  Patient Details  Name: Theresa [name] MRN: [id number] Date of Birth: 07-18-1956   Medicare Important Message Given:  Yes     [name] 06/22/2022, 10:51 AM

## 2022-06-22 NOTE — Discharge Summary (Signed)
Physician Discharge Summary   Patient: Theresa [name] MRN: [id number] DOB: 06-Jan-1956  Admit date:     06/19/2022  Discharge date: 06/22/2022  Discharge Physician: Lorella Nimrod   PCP: Birdie Sons, MD   Recommendations at discharge:  Please obtain CBC and BMP in 1 week Follow up with primary care provider and gastroenterologist  Discharge Diagnoses: Principal Problem:   ABLA (acute blood loss anemia) Active Problems:   Essential (primary) hypertension   Pulmonary arterial hypertension (HCC)   Hypothyroidism, unspecified   Chronic respiratory failure with hypoxia (HCC)   CHF (congestive heart failure) (HCC)   Paroxysmal atrial fibrillation ([place])  Resolved Problems:   Obesity (BMI 30-39.9)   Nicotine dependence  Hospital Course: Taken from prior notes.  This 66 years old female with PMH significant for chronic diastolic CHF, pulmonary hypertension, COPD on 2.5 L of supplemental oxygen at baseline, GERD, hypothyroidism, depression, nicotine dependent, obstructive sleep apnea, chronic pain syndrome, obesity presented in the ED for the evaluation of worsening shortness of breath mostly on exertion over the last several days as well as abdominal pain.  Patient reports abdominal pain is progressive and getting worse and has developed coffee-ground emesis x1.  She denies any NSAIDs use.  Labs show significant drop in her hemoglobin from 13.5-7.4 over the last 1 month.  She is also found to have hypokalemia.  Patient is admitted for GI bleed and started on IV pantoprazole.  GI is consulted and she underwent EGD, found to have gastritis, nonbleeding duodenal ulcers.  GI recommended pantoprazole 40 mg daily. Patient received 2 unit of PRBC.  Hemoglobin stable around 8 on discharge.  After discussing with gastroenterologist she was advised to restart her Xarelto in 1 day.  Patient continued to have her chronic back pain for which she requires opioids-being managed by PCP.  Patient will  continue on current medications and need to have a close follow-up with her providers for further recommendations.  Assessment and Plan: * ABLA (acute blood loss anemia) Patient presents to the ER for evaluation of abdominal pain mostly in the epigastrium and periumbilical area associated with an episode of coffee-ground emesis and is noted to have a drop in her H&H from 13.5 >> 7.4 over the last 1 month. She complains of exertional shortness of breath which may be related to symptomatic anemia Concern for possible peptic ulcer disease Hold Xarelto and aspirin Place patient on Protonix 40 mg IV every 12 Consult gastroenterology Obtain iron studies We will transfuse 1 unit of packed RBC Check serial H&H  Essential (primary) hypertension Hold Cardizem and metoprolol for now since patient is normotensive  Pulmonary arterial hypertension ([place]) Hold Letairis since patient is normotensive  Hypothyroidism, unspecified Stable Continue Synthroid  Obesity (BMI 30-39.9)-resolved as of 06/21/2022 Patient has a BMI of 26.9 Complicates overall prognosis and care Lifestyle modification and exercise has been discussed with patient in detail  Paroxysmal atrial fibrillation (HCC) Continue amiodarone for rate control Hold metoprolol and Cardizem since patient is normotensive Hold apixaban and aspirin due to acute blood loss anemia  CHF (congestive heart failure) (HCC) Chronic diastolic dysfunction CHF Stable and not acutely exacerbated Last known LVEF of 60 to 65% from a 2D echocardiogram which was done 07/23 Hold metolazone and torsemide for now since patient is normotensive Hold metoprolol for now  Chronic respiratory failure with hypoxia ([place]) Patient has a history of COPD with chronic respiratory failure and is on 2.5 L of oxygen continuous. Maintain pulse oximetry greater than 92%  Nicotine  dependence-resolved as of 06/21/2022 Smoking cessation has been discussed with patient in  detail According to the patient she is down to 10 cigarettes daily and is gradually cutting back She declines a nicotine transdermal patch at this time   Pain control - [facility] Controlled Substance Reporting System database was reviewed. and patient was instructed, not to drive, operate heavy machinery, perform activities at heights, swimming or participation in water activities or provide baby-sitting services while on Pain, Sleep and Anxiety Medications; until their outpatient Physician has advised to do so again. Also recommended to not to take more than prescribed Pain, Sleep and Anxiety Medications.  Consultants: Gastroenterology Procedures performed: EGD Disposition: Home Diet recommendation:  Discharge Diet Orders (From admission, onward)     Start     Ordered   06/22/22 0000  Diet - low sodium heart healthy        06/22/22 1124           Cardiac diet DISCHARGE MEDICATION: Allergies as of 06/22/2022       Reactions   Gabapentin Other (See Comments)   Higher doses of 800 mg, 600 mg, 300 mg causes excessive sedation and contributes to numerous mechanical falls. (Unsure if 100 mg dose can be tolerated)   Codeine Nausea And Vomiting   Meloxicam Other (See Comments)   Stomach pain        Medication List     STOP taking these medications    aspirin EC 81 MG tablet   dextromethorphan-guaiFENesin 30-600 MG 12hr tablet Commonly known as: MUCINEX DM   diltiazem 60 MG tablet Commonly known as: CARDIZEM       TAKE these medications    acetaminophen 500 MG tablet Commonly known as: TYLENOL Take 1,000 mg by mouth every 8 (eight) hours as needed.   albuterol 108 (90 Base) MCG/ACT inhaler Commonly known as: VENTOLIN HFA Inhale 2 puffs into the lungs every 6 (six) hours as needed for wheezing or shortness of breath.   ambrisentan 5 MG tablet Commonly known as: LETAIRIS Take 5 mg by mouth daily.   amiodarone 200 MG tablet Commonly known as: PACERONE Take 1  tablet (200 mg total) by mouth daily.   Besivance 0.6 % Susp Generic drug: Besifloxacin HCl Place 1 drop into the right eye 3 (three) times daily. For 2 weeks starting 03/29/22   busPIRone 5 MG tablet Commonly known as: BUSPAR Take 1 tablet (5 mg total) by mouth 2 (two) times daily.   butalbital-acetaminophen-caffeine 50-325-40 MG tablet Commonly known as: FIORICET Take 1 tablet by mouth every 6 (six) hours as needed for headache. LIMIT USE TO PREVENT MEDICATION OVERUSE HEADACHE   Cholecalciferol 50 MCG (2000 UT) Caps Take 2,000 Units by mouth daily.   Difluprednate 0.05 % Emul Place 1 drop into the right eye in the morning, at noon, in the evening, and at bedtime. One drop 4 times daily for one week starting 03/29/22. One drop 3 times daily for one week. One drop 2 times daily for one week. One drop once daily for one week, then stop.   diltiazem 180 MG 24 hr capsule Commonly known as: CARDIZEM CD Take 180 mg by mouth daily.   empagliflozin 10 MG Tabs tablet Commonly known as: Jardiance Take 1 tablet (10 mg total) by mouth daily before breakfast.   Fluticasone-Umeclidin-Vilant 100-62.5-25 MCG/INH Aepb Inhale 1 puff into the lungs daily.   HYDROcodone-acetaminophen 5-325 MG tablet Commonly known as: NORCO/VICODIN Take 1 tablet by mouth every 8 (eight) hours as  needed for severe pain. Must last 30 days Start taking on: June 30, 2022   ipratropium-albuterol 0.5-2.5 (3) MG/3ML Soln Commonly known as: DUONEB USE 1 VIAL VIA NEBULIZER EVERY 6 HOURS AS NEEDED FOR SHORTNESS OF BREATH OR WHEEZING   levothyroxine 25 MCG tablet Commonly known as: SYNTHROID TAKE 1 TABLET(25 MCG) BY MOUTH DAILY BEFORE BREAKFAST   metolazone 2.5 MG tablet Commonly known as: ZAROXOLYN Take 1 tablet (2.5 mg total) by mouth 3 (three) times a week. On M, W, F   metoprolol succinate 25 MG 24 hr tablet Commonly known as: Toprol XL Take 1 tablet (25 mg total) by mouth daily.   nicotine 21 mg/24hr  patch Commonly known as: NICODERM CQ - dosed in mg/24 hours Place 1 patch (21 mg total) onto the skin daily.   OXYGEN Inhale 2.5 L/min into the lungs.   pantoprazole 40 MG tablet Commonly known as: PROTONIX Take 1 tablet (40 mg total) by mouth daily. Start taking on: June 23, 2022   potassium chloride SA 20 MEQ tablet Commonly known as: KLOR-CON M Take 20-40 mEq by mouth 2 (two) times daily. Take 40 mEq in the morning and 20 mEq in the evening.   Prolensa 0.07 % Soln Generic drug: Bromfenac Sodium Place 1 drop into the right eye at bedtime. For 4 weeks starting 03/29/22   rivaroxaban 20 MG Tabs tablet Commonly known as: XARELTO Take 1 tablet (20 mg total) by mouth daily with supper.   roflumilast 500 MCG Tabs tablet Commonly known as: DALIRESP Take 500 mcg by mouth daily.   sertraline 100 MG tablet Commonly known as: ZOLOFT TAKE 2 TABLETS BY MOUTH ONCE EVERY MORNING   simvastatin 20 MG tablet Commonly known as: ZOCOR Take 20 mg by mouth at bedtime.   torsemide 20 MG tablet Commonly known as: DEMADEX Take 2 tablets (40 mg total) by mouth 2 (two) times daily. Take 4 tablets QAM and 2 tablets QPM What changed: how much to take        Follow-up Information     Fisher, Kirstie Peri, MD. Schedule an appointment as soon as possible for a visit in 1 week(s).   Specialty: Family Medicine Contact information: [address] [place] [address] [phone]         Efrain Sella, MD. Schedule an appointment as soon as possible for a visit in 2 week(s).   Specialty: Gastroenterology Contact information: [place] [address] [phone]         Yolonda Kida, MD. Schedule an appointment as soon as possible for a visit in 1 week(s).   Specialties: Cardiology, Internal Medicine Contact information: [place] [address] [phone]                Discharge Exam: Danley Danker Weights    06/19/22 1111 06/21/22 1035 06/22/22 0503  Weight: 88.5 kg 88.5 kg 95.3 kg   General.  Obese lady, in no acute distress. Pulmonary.  Lungs clear bilaterally, normal respiratory effort. CV.  Regular rate and rhythm, no JVD, rub or murmur. Abdomen.  Soft, nontender, nondistended, BS positive. CNS.  Alert and oriented .  No focal neurologic deficit. Extremities.  No edema, no cyanosis, pulses intact and symmetrical. Psychiatry.  Judgment and insight appears normal.   Condition at discharge: stable  The results of significant diagnostics from this hospitalization (including imaging, microbiology, ancillary and laboratory) are listed below for reference.   Imaging Studies: CT ABDOMEN PELVIS W CONTRAST  Result Date: 06/19/2022 CLINICAL DATA:  Epigastric pain EXAM: CT ABDOMEN AND PELVIS WITH CONTRAST TECHNIQUE: Multidetector CT imaging of the abdomen and pelvis was performed using the standard protocol following bolus administration of intravenous contrast. RADIATION DOSE REDUCTION: This exam was performed according to the departmental dose-optimization program which includes automated exposure control, adjustment of the mA and/or kV according to patient size and/or use of iterative reconstruction technique. CONTRAST:  174m OMNIPAQUE IOHEXOL 300 MG/ML  SOLN COMPARISON:  10/31/2016 FINDINGS: Lower chest: Coronary artery calcifications are seen. Breathing motion limits evaluation of lower lung fields. Hepatobiliary: There are a few scattered small low-density lesions, possibly cysts or hemangiomas. There is interval increase in number of these low-density foci. There is no dilation of bile ducts. Gallbladder is not distended. Pancreas: No focal abnormalities are seen. Spleen: Spleen measures 12.2 cm in maximum diameter. Adrenals/Urinary Tract: There is a 9 mm nodular density in the left adrenal which has not changed significantly. Right adrenal is unremarkable. There is no hydronephrosis. There are a few  small bilateral renal stones each measuring less than 4 mm. There is a 1.4 cm smooth marginated fluid density lesion in the upper pole of right kidney suggesting renal cyst. There is 8 mm lesion in the medial aspect of right kidney with density measurements ranging up to 92 Hounsfield units, possibly hyperdense hemorrhagic cyst. Ureters are not dilated. Urinary bladder is unremarkable. Stomach/Bowel: Stomach is not distended. Small bowel loops are unremarkable. Appendix is not dilated. There is no significant wall thickening in colon. There is no pericolic stranding. Vascular/Lymphatic: Fairly extensive arterial calcifications are seen. Reproductive: Unremarkable. Other: There is no ascites or pneumoperitoneum. Small bilateral inguinal hernias containing fat are noted. Musculoskeletal: Degenerative changes are noted in lumbar spine, most severe at L2-L3, L4-L5 and L5-S1 levels with encroachment of neural foramina. IMPRESSION: There is no evidence of intestinal obstruction or pneumoperitoneum. Appendix is not dilated. There is no hydronephrosis. There are few small bilateral renal stones. There is 1.4 cm cyst in the upper pole of right kidney. There is 8 mm hyperdense lesion, possibly hemorrhagic cyst in the medial right kidney. There are multiple low-density lesions in liver, possibly cysts or hemangiomas. Lumbar spondylosis. Other findings as described in the body of the report. Electronically Signed   By: PElmer PickerM.D.   On: 06/19/2022 13:11   DG Chest 2 View  Result Date: 06/19/2022 CLINICAL DATA:  Shortness of breath EXAM: CHEST - 2 VIEW COMPARISON:  Radiograph 04/26/2022, chest CT 04/09/2022 FINDINGS: Unchanged cardiomediastinal silhouette with enlarged pulmonary arteries. Mild interstitial opacities. No large pleural effusion. No pneumothorax. No acute osseous abnormality. Thoracic spondylosis. IMPRESSION: Mild interstitial opacities which could reflect mild edema or changes related to emphysema.  Electronically Signed   By: JMaurine SimmeringM.D.   On: 06/19/2022 11:37    Microbiology: Results for orders placed or performed during the hospital encounter of 04/23/22  MRSA Next Gen by PCR, Nasal     Status: None   Collection Time: 04/24/22  3:28 PM   Specimen: Nasal Mucosa; Nasal Swab  Result Value Ref Range Status   MRSA by PCR Next Gen NOT DETECTED NOT DETECTED Final    Comment: (NOTE) The GeneXpert MRSA Assay (FDA approved for NASAL specimens only), is one component of a comprehensive MRSA colonization surveillance program. It is not intended to diagnose MRSA infection nor to guide or monitor treatment for MRSA infections. Test performance is not FDA approved in patients less than 241years old. [facility]formed at [facility], [address], [place], [place] [address]   SARS Coronavirus 2 by RT PCR (hospital order, performed in [facility] hospital lab) *cepheid single result test*     Status: None   Collection Time: 04/27/22 12:00 PM  Result Value Ref Range Status   SARS Coronavirus 2 by RT PCR NEGATIVE NEGATIVE Final    Comment: (NOTE) SARS-CoV-2 target nucleic acids are NOT DETECTED.  The SARS-CoV-2 RNA is generally detectable in upper and lower respiratory specimens during the acute phase of infection. The lowest concentration of SARS-CoV-2 viral copies this assay can detect is 250 copies / mL. A negative result does not preclude SARS-CoV-2 infection and should not be used as the sole basis for treatment or other patient management decisions.  A negative result may occur with improper specimen collection / handling, submission of specimen other than nasopharyngeal swab, presence of viral mutation(s) within the areas targeted by this assay, and inadequate number of viral copies (<250 copies / mL). A negative result must be combined with clinical observations, patient history, and epidemiological information.  Fact Sheet for Patients:    https://www.patel.info/  Fact Sheet for Healthcare Providers: https://hall.com/  This test is not yet approved or  cleared by the [place] FDA and has been authorized for detection and/or diagnosis of SARS-CoV-2 by FDA under an Emergency Use Authorization (EUA).  This EUA will remain in effect (meaning this test can be used) for the duration of the COVID-19 declaration under Section 564(b)(1) of the Act, 21 U.S.C. section 360bbb-3(b)(1), unless the authorization is terminated or revoked sooner.  Performed at [facility], [address] [place], [place] [address]     Labs: CBC: Recent Labs  Lab 06/19/22 1113 06/19/22 2031 06/21/22 0417 06/21/22 0901 06/21/22 2335 06/22/22 0636 06/22/22 0811  WBC 9.8  --  6.5  --   --   --   --   HGB 7.4*   < > 7.5* 7.8* 7.6* 8.0* 8.4*  HCT 26.0*   < > 26.0* 26.6* 26.2* 27.4* 29.0*  MCV 86.1  --  87.0  --   --   --   --   PLT 306  --  224  --   --   --   --    < > = values in this interval not displayed.   Basic Metabolic Panel: Recent Labs  Lab 06/19/22 1113 06/19/22 1401 06/20/22 0809 06/21/22 0417  NA 133*  --  138 137  K 2.9*  --  4.2 4.3  CL 91*  --  101 103  CO2 32  --  32 29  GLUCOSE 99  --  102* 94  BUN 22  --  18 10  CREATININE 0.91  --  0.92 0.72  CALCIUM 9.3  --  9.4 9.1  MG  --  2.7*  --  2.3  PHOS  --   --   --  3.2   Liver Function Tests: Recent Labs  Lab 06/19/22 1113  AST 21  ALT 18  ALKPHOS 73  BILITOT 0.6  PROT 7.3  ALBUMIN 3.9   CBG: No results for input(s): "GLUCAP" in the last 168 hours.  Discharge time spent: greater than 30 minutes.  This record has been created using Systems analyst. Errors have been sought and corrected,but may not always be located. Such creation errors do not reflect on the standard of care.   Signed: Lorella Nimrod, MD [facility] 06/22/2022

## 2022-06-23 ENCOUNTER — Telehealth: Payer: Self-pay

## 2022-06-23 IMAGING — MR MR LUMBAR SPINE W/O CM
5 series · 30 of 48 positions shown · non-contrast
Comparison: 07/23/2019

CLINICAL DATA: Low back pain, occasional bilateral leg pain,
right-greater-than-left

EXAM:
MRI LUMBAR SPINE WITHOUT CONTRAST
TECHNIQUE: Multiplanar, multisequence MR imaging of the lumbar spine was
performed. No intravenous contrast was administered.

[Series 5: T2 · sagittal · 4.0mm · 0.81mm/px · 6 of 17 slices shown (1 of 2)]
[im 1/17]
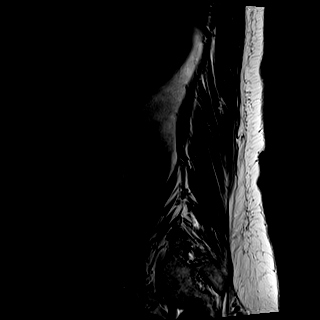
[im 4/17]
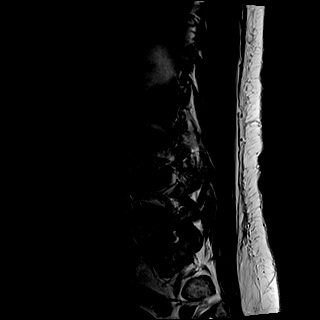
[im 7/17]
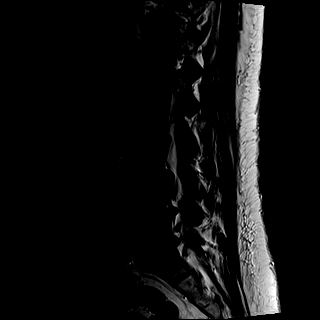
[im 10/17]
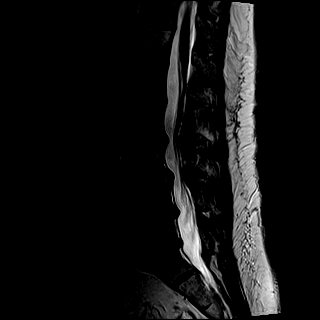
[im 13/17]
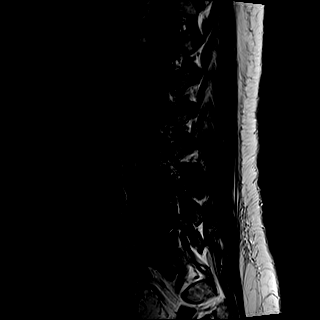
[im 17/17]
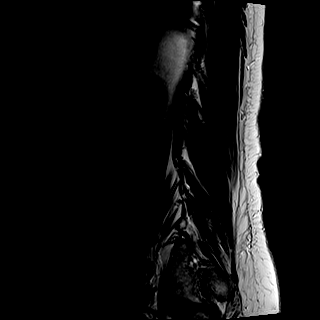

[Series 6: T1 · sagittal · 4.0mm · 0.81mm/px · 7 of 17 slices shown (1 of 2)]
[im 1/17]
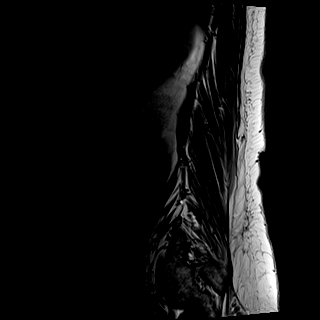
[im 3/17]
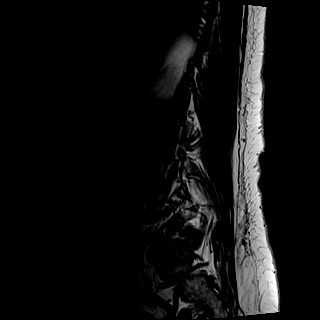
[im 6/17]
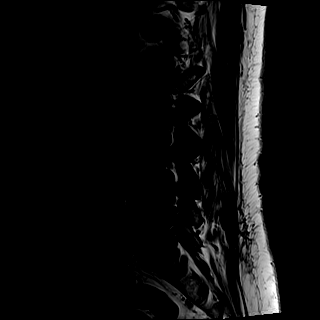
[im 9/17]
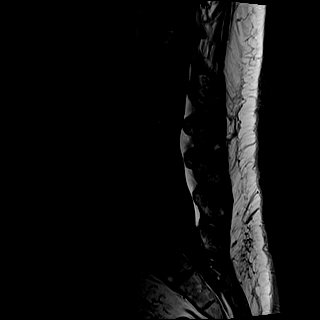
[im 11/17]
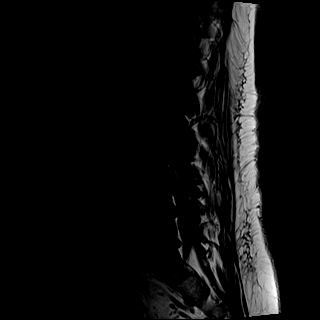
[im 14/17]
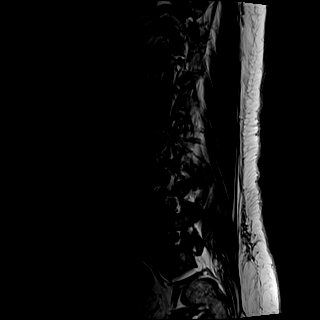
[im 17/17]
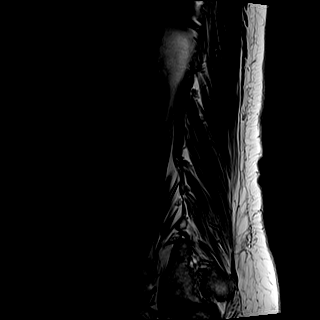

[Series 7: STIR · sagittal · 4.0mm · 0.41mm/px · 1 of 17 slices shown]
[im 1/17]
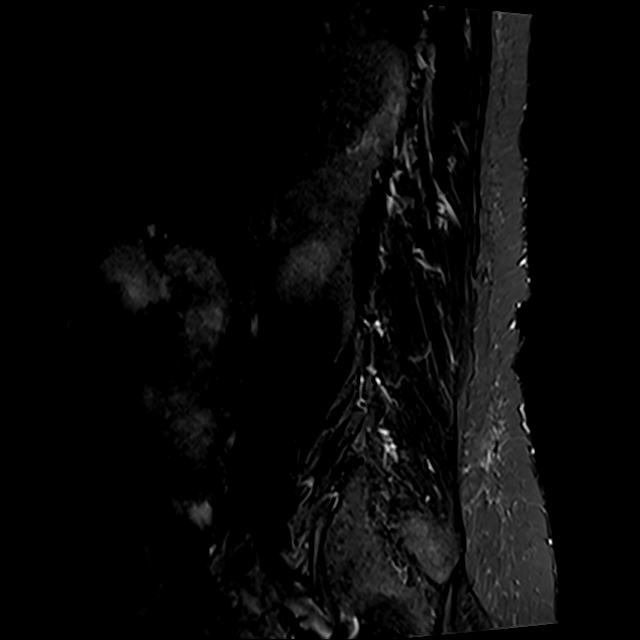

[Series 8: T2 · axial · 4.0mm · 0.78mm/px · z∈[-85,+125]mm · 8 of 37 slices shown (2 of 2)]
[im 1/37]
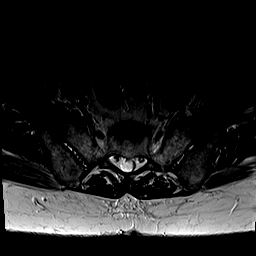
[im 6/37]
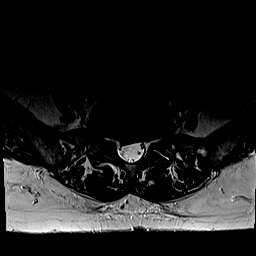
[im 12/37]
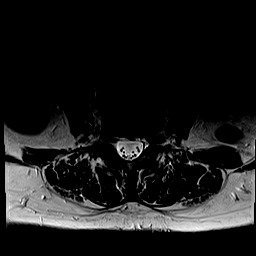
[im 17/37]
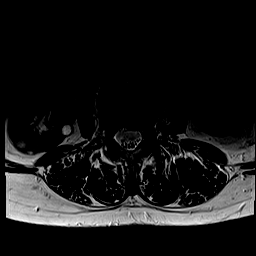
[im 20/37]
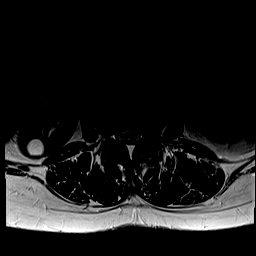
[im 25/37]
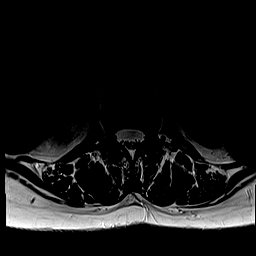
[im 31/37]
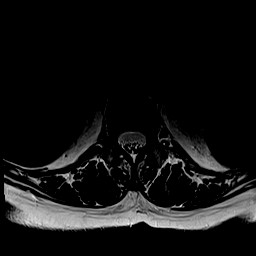
[im 37/37]
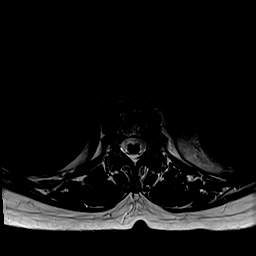

[Series 9: T1 · axial · 4.0mm · 0.39mm/px · z∈[-85,+125]mm · 8 of 37 slices shown (2 of 2)]
[im 1/37]
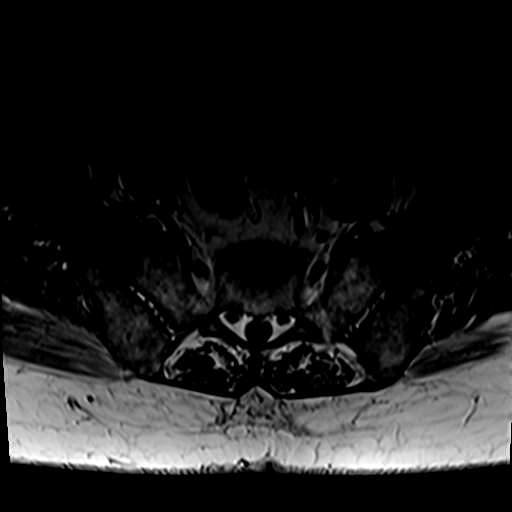
[im 6/37]
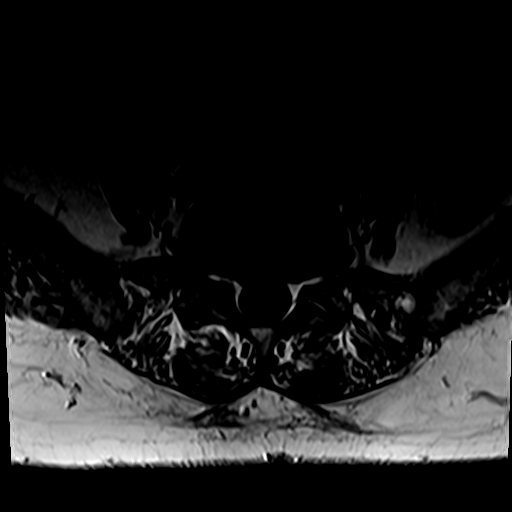
[im 12/37]
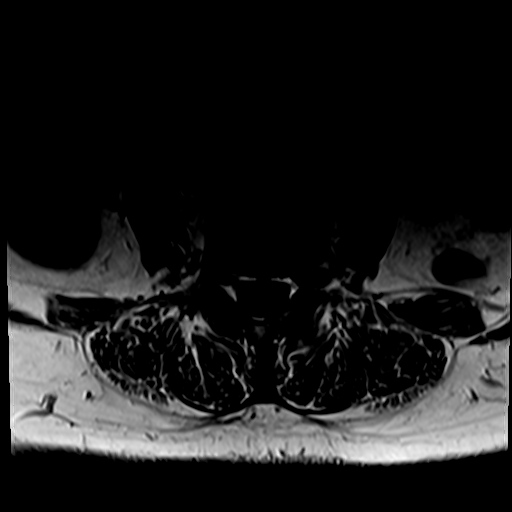
[im 17/37]
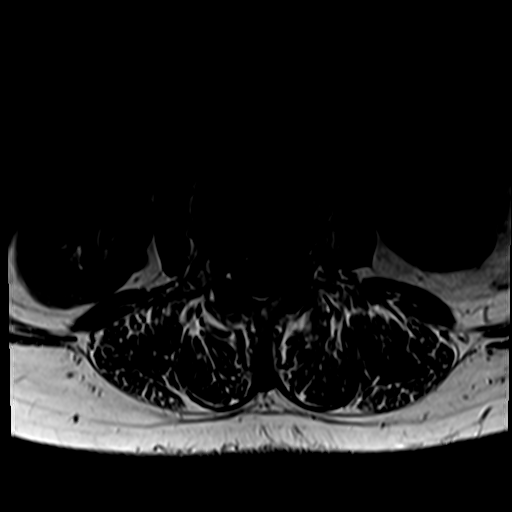
[im 20/37]
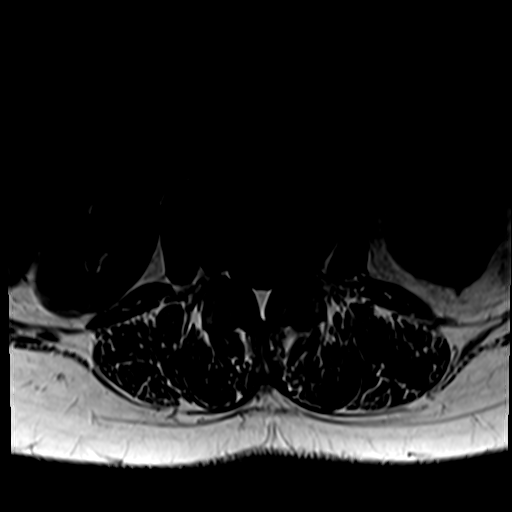
[im 25/37]
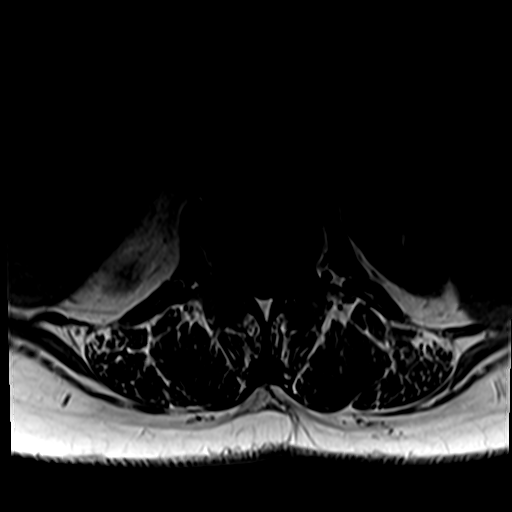
[im 31/37]
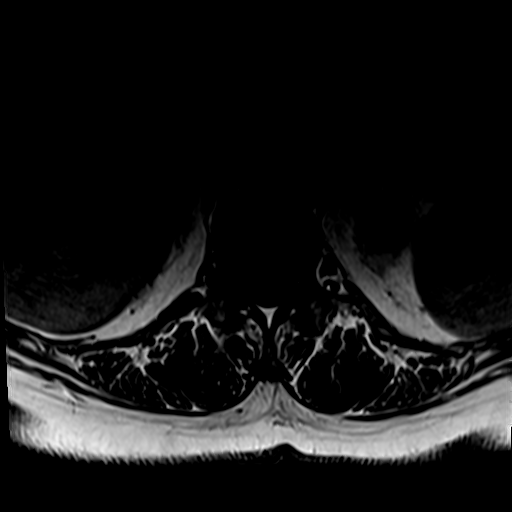
[im 37/37]
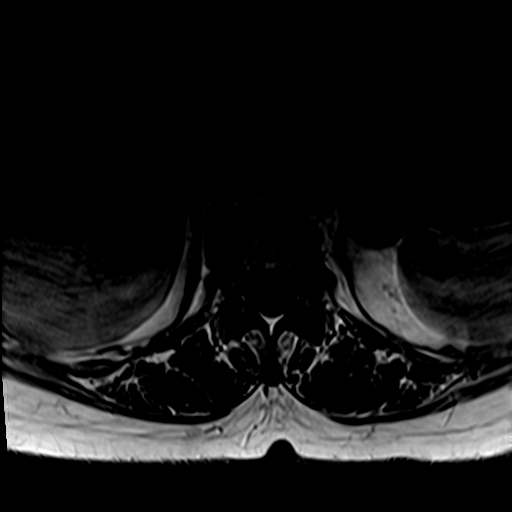

[30 of 48 positions shown; findings below may reference images not displayed]

FINDINGS: Segmentation:  5 lumbar-type vertebral bodies.

Alignment: Straightening of the normal cervical lordosis. 4 mm
anterolisthesis L3 on L4, previously 2 mm. Trace retrolisthesis L5
on S1, unchanged. Mild dextrocurvature.

Vertebrae: No acute fracture or suspicious osseous lesion.
Redemonstrated T12 hemangioma.

Conus medullaris and cauda equina: Conus extends to the L1 level.
Conus and cauda equina appear normal.

Paraspinal and other soft tissues: Small hepatic cysts.
Right-greater-than-left renal cysts.

Disc levels:

T12-L1: No significant disc bulge. No spinal canal stenosis or
neural foraminal narrowing.

L1-L2: No significant disc bulge. No spinal canal stenosis or neural
foraminal narrowing.

L2-L3: Possible interval left hemilaminectomy, with previously noted
left subarticular disc extrusion no longer seen. Mild disc bulge. No
spinal canal stenosis or neural foraminal narrowing.

L3-L4: Grade 1 anterolisthesis with disc unroofing. Mild facet
arthropathy. No spinal canal stenosis. Mild left neural foraminal
narrowing.

L4-L5: Disc height loss with broad-based disc osteophyte complex. No
spinal canal stenosis. Moderate left-greater-than-right neural
foraminal narrowing.

L5-S1: Disc bulge with superimposed right subarticular disc
protrusion, which may contact the descending right S1 nerve roots.
Mild-to-moderate facet arthropathy. No spinal canal stenosis.
Mild-to-moderate right greater than left neural foraminal narrowing.
IMPRESSION: 1. Previously noted large left subarticular disc extrusion at L2-L3
is no longer seen, possibly surgically treated.
2. L5-S1 subarticular disc protrusion, which may contact the
descending right S1 nerve roots. At this level there is
mild-to-moderate right-greater-than-left neural foraminal narrowing.
3. L4-L5 moderate left-greater-than-right neural foraminal
narrowing.

## 2022-06-23 NOTE — Telephone Encounter (Addendum)
Transition Care Management Unsuccessful Follow-up Telephone Call  Date of discharge and from where:  TCM DC [facility] 06-22-22 Dx: Acute blood loss anemia  Attempts:  1st Attempt  Reason for unsuccessful TCM follow-up call:  Left voice message  Transition Care Management Unsuccessful Follow-up Telephone Call  Date of discharge and from where:  TCM DC [facility] 06-22-22 Dx: Acute blood loss anemia  Attempts:  2nd Attempt  Reason for unsuccessful TCM follow-up call:  Left voice message  Transition Care Management Unsuccessful Follow-up Telephone Call  Date of discharge and from where:  TCM DC [facility] 06-22-22 Dx: Acute blood loss anemia  Attempts:  3rd attempt  Reason for unsuccessful TCM follow-up call:  Left voice message

## 2022-06-26 DIAGNOSIS — Z9989 Dependence on other enabling machines and devices: Secondary | ICD-10-CM | POA: Diagnosis not present

## 2022-06-26 DIAGNOSIS — I071 Rheumatic tricuspid insufficiency: Secondary | ICD-10-CM | POA: Diagnosis not present

## 2022-06-26 DIAGNOSIS — M25511 Pain in right shoulder: Secondary | ICD-10-CM | POA: Diagnosis not present

## 2022-06-26 DIAGNOSIS — J9621 Acute and chronic respiratory failure with hypoxia: Secondary | ICD-10-CM | POA: Diagnosis not present

## 2022-06-26 DIAGNOSIS — I2510 Atherosclerotic heart disease of native coronary artery without angina pectoris: Secondary | ICD-10-CM | POA: Insufficient documentation

## 2022-06-26 DIAGNOSIS — R1312 Dysphagia, oropharyngeal phase: Secondary | ICD-10-CM | POA: Diagnosis not present

## 2022-06-26 DIAGNOSIS — I4892 Unspecified atrial flutter: Secondary | ICD-10-CM | POA: Diagnosis not present

## 2022-06-26 DIAGNOSIS — J9622 Acute and chronic respiratory failure with hypercapnia: Secondary | ICD-10-CM | POA: Diagnosis not present

## 2022-06-26 DIAGNOSIS — F32A Depression, unspecified: Secondary | ICD-10-CM | POA: Diagnosis not present

## 2022-06-26 DIAGNOSIS — E785 Hyperlipidemia, unspecified: Secondary | ICD-10-CM | POA: Diagnosis not present

## 2022-06-26 DIAGNOSIS — R911 Solitary pulmonary nodule: Secondary | ICD-10-CM | POA: Diagnosis not present

## 2022-06-26 DIAGNOSIS — J439 Emphysema, unspecified: Secondary | ICD-10-CM | POA: Diagnosis not present

## 2022-06-26 DIAGNOSIS — J441 Chronic obstructive pulmonary disease with (acute) exacerbation: Secondary | ICD-10-CM | POA: Diagnosis not present

## 2022-06-26 DIAGNOSIS — I5032 Chronic diastolic (congestive) heart failure: Secondary | ICD-10-CM | POA: Diagnosis not present

## 2022-06-26 DIAGNOSIS — D509 Iron deficiency anemia, unspecified: Secondary | ICD-10-CM | POA: Diagnosis not present

## 2022-06-26 DIAGNOSIS — E669 Obesity, unspecified: Secondary | ICD-10-CM | POA: Diagnosis not present

## 2022-06-26 DIAGNOSIS — Z6836 Body mass index (BMI) 36.0-36.9, adult: Secondary | ICD-10-CM | POA: Diagnosis not present

## 2022-06-26 DIAGNOSIS — K219 Gastro-esophageal reflux disease without esophagitis: Secondary | ICD-10-CM | POA: Diagnosis not present

## 2022-06-26 DIAGNOSIS — R0789 Other chest pain: Secondary | ICD-10-CM | POA: Diagnosis not present

## 2022-06-26 DIAGNOSIS — M75121 Complete rotator cuff tear or rupture of right shoulder, not specified as traumatic: Secondary | ICD-10-CM | POA: Diagnosis not present

## 2022-06-26 DIAGNOSIS — I5033 Acute on chronic diastolic (congestive) heart failure: Secondary | ICD-10-CM | POA: Diagnosis not present

## 2022-06-26 DIAGNOSIS — M19011 Primary osteoarthritis, right shoulder: Secondary | ICD-10-CM | POA: Diagnosis not present

## 2022-06-26 DIAGNOSIS — R69 Illness, unspecified: Secondary | ICD-10-CM | POA: Diagnosis not present

## 2022-06-26 DIAGNOSIS — R0600 Dyspnea, unspecified: Secondary | ICD-10-CM | POA: Diagnosis not present

## 2022-06-26 DIAGNOSIS — R0602 Shortness of breath: Secondary | ICD-10-CM | POA: Diagnosis not present

## 2022-06-26 DIAGNOSIS — E876 Hypokalemia: Secondary | ICD-10-CM | POA: Diagnosis not present

## 2022-06-26 DIAGNOSIS — K269 Duodenal ulcer, unspecified as acute or chronic, without hemorrhage or perforation: Secondary | ICD-10-CM | POA: Diagnosis not present

## 2022-06-26 DIAGNOSIS — B9620 Unspecified Escherichia coli [E. coli] as the cause of diseases classified elsewhere: Secondary | ICD-10-CM | POA: Diagnosis not present

## 2022-06-26 DIAGNOSIS — Z7901 Long term (current) use of anticoagulants: Secondary | ICD-10-CM | POA: Diagnosis not present

## 2022-06-26 DIAGNOSIS — F17210 Nicotine dependence, cigarettes, uncomplicated: Secondary | ICD-10-CM | POA: Diagnosis not present

## 2022-06-26 DIAGNOSIS — J449 Chronic obstructive pulmonary disease, unspecified: Secondary | ICD-10-CM | POA: Diagnosis not present

## 2022-06-26 DIAGNOSIS — R918 Other nonspecific abnormal finding of lung field: Secondary | ICD-10-CM | POA: Diagnosis not present

## 2022-06-26 DIAGNOSIS — U071 COVID-19: Secondary | ICD-10-CM | POA: Diagnosis not present

## 2022-06-26 DIAGNOSIS — I110 Hypertensive heart disease with heart failure: Secondary | ICD-10-CM | POA: Diagnosis not present

## 2022-06-26 DIAGNOSIS — J1282 Pneumonia due to coronavirus disease 2019: Secondary | ICD-10-CM | POA: Diagnosis not present

## 2022-06-26 DIAGNOSIS — E8770 Fluid overload, unspecified: Secondary | ICD-10-CM | POA: Diagnosis not present

## 2022-06-26 DIAGNOSIS — I517 Cardiomegaly: Secondary | ICD-10-CM | POA: Diagnosis not present

## 2022-06-26 DIAGNOSIS — J9811 Atelectasis: Secondary | ICD-10-CM | POA: Diagnosis not present

## 2022-06-26 DIAGNOSIS — R519 Headache, unspecified: Secondary | ICD-10-CM | POA: Diagnosis not present

## 2022-06-26 DIAGNOSIS — I272 Pulmonary hypertension, unspecified: Secondary | ICD-10-CM | POA: Diagnosis not present

## 2022-06-26 DIAGNOSIS — J811 Chronic pulmonary edema: Secondary | ICD-10-CM | POA: Diagnosis not present

## 2022-06-26 DIAGNOSIS — F411 Generalized anxiety disorder: Secondary | ICD-10-CM | POA: Diagnosis not present

## 2022-06-26 DIAGNOSIS — M940 Chondrocostal junction syndrome [Tietze]: Secondary | ICD-10-CM | POA: Diagnosis not present

## 2022-06-26 DIAGNOSIS — I2721 Secondary pulmonary arterial hypertension: Secondary | ICD-10-CM | POA: Diagnosis not present

## 2022-06-26 DIAGNOSIS — N390 Urinary tract infection, site not specified: Secondary | ICD-10-CM | POA: Diagnosis not present

## 2022-06-26 DIAGNOSIS — I7789 Other specified disorders of arteries and arterioles: Secondary | ICD-10-CM | POA: Diagnosis not present

## 2022-06-26 DIAGNOSIS — F17201 Nicotine dependence, unspecified, in remission: Secondary | ICD-10-CM | POA: Diagnosis not present

## 2022-06-26 DIAGNOSIS — M899 Disorder of bone, unspecified: Secondary | ICD-10-CM | POA: Diagnosis not present

## 2022-06-27 DIAGNOSIS — I2721 Secondary pulmonary arterial hypertension: Secondary | ICD-10-CM | POA: Diagnosis not present

## 2022-06-27 DIAGNOSIS — I071 Rheumatic tricuspid insufficiency: Secondary | ICD-10-CM | POA: Diagnosis not present

## 2022-06-27 DIAGNOSIS — J441 Chronic obstructive pulmonary disease with (acute) exacerbation: Secondary | ICD-10-CM | POA: Diagnosis not present

## 2022-06-27 DIAGNOSIS — I4892 Unspecified atrial flutter: Secondary | ICD-10-CM | POA: Diagnosis not present

## 2022-06-27 DIAGNOSIS — J9621 Acute and chronic respiratory failure with hypoxia: Secondary | ICD-10-CM | POA: Diagnosis not present

## 2022-06-27 DIAGNOSIS — M25511 Pain in right shoulder: Secondary | ICD-10-CM | POA: Diagnosis not present

## 2022-06-27 DIAGNOSIS — Z6836 Body mass index (BMI) 36.0-36.9, adult: Secondary | ICD-10-CM | POA: Diagnosis not present

## 2022-06-27 DIAGNOSIS — U071 COVID-19: Secondary | ICD-10-CM | POA: Diagnosis not present

## 2022-06-27 DIAGNOSIS — J9622 Acute and chronic respiratory failure with hypercapnia: Secondary | ICD-10-CM | POA: Diagnosis not present

## 2022-06-27 DIAGNOSIS — K269 Duodenal ulcer, unspecified as acute or chronic, without hemorrhage or perforation: Secondary | ICD-10-CM | POA: Insufficient documentation

## 2022-06-27 DIAGNOSIS — I5032 Chronic diastolic (congestive) heart failure: Secondary | ICD-10-CM | POA: Diagnosis not present

## 2022-06-27 DIAGNOSIS — M899 Disorder of bone, unspecified: Secondary | ICD-10-CM | POA: Diagnosis not present

## 2022-06-27 DIAGNOSIS — Z7901 Long term (current) use of anticoagulants: Secondary | ICD-10-CM | POA: Diagnosis not present

## 2022-06-28 DIAGNOSIS — I517 Cardiomegaly: Secondary | ICD-10-CM | POA: Diagnosis not present

## 2022-06-28 DIAGNOSIS — J9622 Acute and chronic respiratory failure with hypercapnia: Secondary | ICD-10-CM | POA: Diagnosis not present

## 2022-06-28 DIAGNOSIS — R0600 Dyspnea, unspecified: Secondary | ICD-10-CM | POA: Diagnosis not present

## 2022-06-28 DIAGNOSIS — R918 Other nonspecific abnormal finding of lung field: Secondary | ICD-10-CM | POA: Diagnosis not present

## 2022-06-28 DIAGNOSIS — J9621 Acute and chronic respiratory failure with hypoxia: Secondary | ICD-10-CM | POA: Diagnosis not present

## 2022-06-28 DIAGNOSIS — I7789 Other specified disorders of arteries and arterioles: Secondary | ICD-10-CM | POA: Diagnosis not present

## 2022-06-29 DIAGNOSIS — I2721 Secondary pulmonary arterial hypertension: Secondary | ICD-10-CM | POA: Diagnosis not present

## 2022-06-29 DIAGNOSIS — J9622 Acute and chronic respiratory failure with hypercapnia: Secondary | ICD-10-CM | POA: Diagnosis not present

## 2022-06-29 DIAGNOSIS — J811 Chronic pulmonary edema: Secondary | ICD-10-CM | POA: Diagnosis not present

## 2022-06-29 DIAGNOSIS — J9621 Acute and chronic respiratory failure with hypoxia: Secondary | ICD-10-CM | POA: Diagnosis not present

## 2022-06-29 DIAGNOSIS — R0600 Dyspnea, unspecified: Secondary | ICD-10-CM | POA: Diagnosis not present

## 2022-06-30 DIAGNOSIS — J9621 Acute and chronic respiratory failure with hypoxia: Secondary | ICD-10-CM | POA: Diagnosis not present

## 2022-06-30 DIAGNOSIS — J9622 Acute and chronic respiratory failure with hypercapnia: Secondary | ICD-10-CM | POA: Diagnosis not present

## 2022-06-30 DIAGNOSIS — R0600 Dyspnea, unspecified: Secondary | ICD-10-CM | POA: Diagnosis not present

## 2022-06-30 DIAGNOSIS — R918 Other nonspecific abnormal finding of lung field: Secondary | ICD-10-CM | POA: Diagnosis not present

## 2022-07-01 DIAGNOSIS — R0600 Dyspnea, unspecified: Secondary | ICD-10-CM | POA: Diagnosis not present

## 2022-07-01 DIAGNOSIS — R69 Illness, unspecified: Secondary | ICD-10-CM | POA: Diagnosis not present

## 2022-07-01 NOTE — Chronic Care Management (AMB) (Signed)
  Care Coordination  Outreach Note  07/01/2022 Name: Theresa [name] MRN: [id number] DOB: [birth year]   Care Coordination Outreach Attempts: A second unsuccessful outreach was attempted today to offer the patient with information about available care coordination services as a benefit of their health plan.     Follow Up Plan:  Additional outreach attempts will be made to offer the patient care coordination information and services.   Encounter Outcome:  No Answer  Julian Hy, [place] Direct Dial: [phone]

## 2022-07-02 DIAGNOSIS — R911 Solitary pulmonary nodule: Secondary | ICD-10-CM | POA: Diagnosis not present

## 2022-07-02 DIAGNOSIS — J9622 Acute and chronic respiratory failure with hypercapnia: Secondary | ICD-10-CM | POA: Diagnosis not present

## 2022-07-02 DIAGNOSIS — J9811 Atelectasis: Secondary | ICD-10-CM | POA: Diagnosis not present

## 2022-07-02 DIAGNOSIS — U071 COVID-19: Secondary | ICD-10-CM | POA: Diagnosis not present

## 2022-07-02 DIAGNOSIS — I7789 Other specified disorders of arteries and arterioles: Secondary | ICD-10-CM | POA: Diagnosis not present

## 2022-07-02 DIAGNOSIS — J9621 Acute and chronic respiratory failure with hypoxia: Secondary | ICD-10-CM | POA: Diagnosis not present

## 2022-07-02 DIAGNOSIS — I272 Pulmonary hypertension, unspecified: Secondary | ICD-10-CM | POA: Diagnosis not present

## 2022-07-02 DIAGNOSIS — I071 Rheumatic tricuspid insufficiency: Secondary | ICD-10-CM | POA: Diagnosis not present

## 2022-07-02 DIAGNOSIS — J441 Chronic obstructive pulmonary disease with (acute) exacerbation: Secondary | ICD-10-CM | POA: Diagnosis not present

## 2022-07-02 DIAGNOSIS — I2721 Secondary pulmonary arterial hypertension: Secondary | ICD-10-CM | POA: Diagnosis not present

## 2022-07-03 DIAGNOSIS — J9621 Acute and chronic respiratory failure with hypoxia: Secondary | ICD-10-CM | POA: Diagnosis not present

## 2022-07-03 DIAGNOSIS — U071 COVID-19: Secondary | ICD-10-CM | POA: Diagnosis not present

## 2022-07-03 DIAGNOSIS — I2721 Secondary pulmonary arterial hypertension: Secondary | ICD-10-CM | POA: Diagnosis not present

## 2022-07-03 DIAGNOSIS — J9622 Acute and chronic respiratory failure with hypercapnia: Secondary | ICD-10-CM | POA: Diagnosis not present

## 2022-07-03 DIAGNOSIS — R519 Headache, unspecified: Secondary | ICD-10-CM | POA: Diagnosis not present

## 2022-07-03 DIAGNOSIS — J441 Chronic obstructive pulmonary disease with (acute) exacerbation: Secondary | ICD-10-CM | POA: Diagnosis not present

## 2022-07-03 DIAGNOSIS — I5032 Chronic diastolic (congestive) heart failure: Secondary | ICD-10-CM | POA: Diagnosis not present

## 2022-07-03 DIAGNOSIS — Z6836 Body mass index (BMI) 36.0-36.9, adult: Secondary | ICD-10-CM | POA: Diagnosis not present

## 2022-07-03 DIAGNOSIS — I071 Rheumatic tricuspid insufficiency: Secondary | ICD-10-CM | POA: Diagnosis not present

## 2022-07-04 DIAGNOSIS — J441 Chronic obstructive pulmonary disease with (acute) exacerbation: Secondary | ICD-10-CM | POA: Diagnosis not present

## 2022-07-04 DIAGNOSIS — I071 Rheumatic tricuspid insufficiency: Secondary | ICD-10-CM | POA: Diagnosis not present

## 2022-07-04 DIAGNOSIS — I5032 Chronic diastolic (congestive) heart failure: Secondary | ICD-10-CM | POA: Diagnosis not present

## 2022-07-04 DIAGNOSIS — I2721 Secondary pulmonary arterial hypertension: Secondary | ICD-10-CM | POA: Diagnosis not present

## 2022-07-04 DIAGNOSIS — J9621 Acute and chronic respiratory failure with hypoxia: Secondary | ICD-10-CM | POA: Diagnosis not present

## 2022-07-04 DIAGNOSIS — U071 COVID-19: Secondary | ICD-10-CM | POA: Diagnosis not present

## 2022-07-04 DIAGNOSIS — J9622 Acute and chronic respiratory failure with hypercapnia: Secondary | ICD-10-CM | POA: Diagnosis not present

## 2022-07-05 DIAGNOSIS — I071 Rheumatic tricuspid insufficiency: Secondary | ICD-10-CM | POA: Diagnosis not present

## 2022-07-05 DIAGNOSIS — U071 COVID-19: Secondary | ICD-10-CM | POA: Diagnosis not present

## 2022-07-05 DIAGNOSIS — J9622 Acute and chronic respiratory failure with hypercapnia: Secondary | ICD-10-CM | POA: Diagnosis not present

## 2022-07-05 DIAGNOSIS — I2721 Secondary pulmonary arterial hypertension: Secondary | ICD-10-CM | POA: Diagnosis not present

## 2022-07-05 DIAGNOSIS — J9621 Acute and chronic respiratory failure with hypoxia: Secondary | ICD-10-CM | POA: Diagnosis not present

## 2022-07-06 ENCOUNTER — Telehealth: Payer: Self-pay | Admitting: Pain Medicine

## 2022-07-06 ENCOUNTER — Inpatient Hospital Stay: Payer: Medicare HMO | Admitting: Family Medicine

## 2022-07-06 DIAGNOSIS — M19011 Primary osteoarthritis, right shoulder: Secondary | ICD-10-CM | POA: Diagnosis not present

## 2022-07-06 DIAGNOSIS — M75121 Complete rotator cuff tear or rupture of right shoulder, not specified as traumatic: Secondary | ICD-10-CM | POA: Diagnosis not present

## 2022-07-06 DIAGNOSIS — I071 Rheumatic tricuspid insufficiency: Secondary | ICD-10-CM | POA: Diagnosis not present

## 2022-07-06 DIAGNOSIS — J9621 Acute and chronic respiratory failure with hypoxia: Secondary | ICD-10-CM | POA: Diagnosis not present

## 2022-07-06 DIAGNOSIS — I2721 Secondary pulmonary arterial hypertension: Secondary | ICD-10-CM | POA: Diagnosis not present

## 2022-07-06 DIAGNOSIS — U071 COVID-19: Secondary | ICD-10-CM | POA: Diagnosis not present

## 2022-07-06 DIAGNOSIS — J9622 Acute and chronic respiratory failure with hypercapnia: Secondary | ICD-10-CM | POA: Diagnosis not present

## 2022-07-06 NOTE — Telephone Encounter (Signed)
Spoke with patient she is in hospital with Covid.

## 2022-07-07 ENCOUNTER — Telehealth: Payer: Self-pay

## 2022-07-07 DIAGNOSIS — R1312 Dysphagia, oropharyngeal phase: Secondary | ICD-10-CM | POA: Diagnosis not present

## 2022-07-07 DIAGNOSIS — J9621 Acute and chronic respiratory failure with hypoxia: Secondary | ICD-10-CM | POA: Diagnosis not present

## 2022-07-07 DIAGNOSIS — J9622 Acute and chronic respiratory failure with hypercapnia: Secondary | ICD-10-CM | POA: Diagnosis not present

## 2022-07-07 DIAGNOSIS — I2721 Secondary pulmonary arterial hypertension: Secondary | ICD-10-CM | POA: Diagnosis not present

## 2022-07-07 DIAGNOSIS — R918 Other nonspecific abnormal finding of lung field: Secondary | ICD-10-CM | POA: Diagnosis not present

## 2022-07-07 DIAGNOSIS — I071 Rheumatic tricuspid insufficiency: Secondary | ICD-10-CM | POA: Diagnosis not present

## 2022-07-07 DIAGNOSIS — U071 COVID-19: Secondary | ICD-10-CM | POA: Diagnosis not present

## 2022-07-07 NOTE — Telephone Encounter (Signed)
She can have a same day slot

## 2022-07-07 NOTE — Telephone Encounter (Signed)
Patient reports that she still admitted and has been told that she probably be home until the beginning of next week but is not sure. She will call back to schedule hospital appointment.

## 2022-07-07 NOTE — Telephone Encounter (Signed)
Copied from [place] [phone]. Topic: Appointment Scheduling - Scheduling Inquiry for Clinic >> Jul 07, 2022  9:33 AM Tiffany B wrote: As per caller patient needs to see her PCP within 7 days for a hospital follow up, patient is being discharged on 07/07/2022 from [place]. Patient was tested positive for COVID on 06/26/2022 ,  patient was seen for oropharyngeal dysphagia. Please call the patient directly. PCP has no available appointments within 7 days.

## 2022-07-08 DIAGNOSIS — J449 Chronic obstructive pulmonary disease, unspecified: Secondary | ICD-10-CM | POA: Diagnosis not present

## 2022-07-08 DIAGNOSIS — M940 Chondrocostal junction syndrome [Tietze]: Secondary | ICD-10-CM | POA: Diagnosis not present

## 2022-07-08 DIAGNOSIS — I2721 Secondary pulmonary arterial hypertension: Secondary | ICD-10-CM | POA: Diagnosis not present

## 2022-07-08 DIAGNOSIS — U071 COVID-19: Secondary | ICD-10-CM | POA: Diagnosis not present

## 2022-07-08 DIAGNOSIS — J1282 Pneumonia due to coronavirus disease 2019: Secondary | ICD-10-CM | POA: Diagnosis not present

## 2022-07-08 DIAGNOSIS — K219 Gastro-esophageal reflux disease without esophagitis: Secondary | ICD-10-CM | POA: Diagnosis not present

## 2022-07-08 DIAGNOSIS — J9622 Acute and chronic respiratory failure with hypercapnia: Secondary | ICD-10-CM | POA: Diagnosis not present

## 2022-07-08 DIAGNOSIS — Z9989 Dependence on other enabling machines and devices: Secondary | ICD-10-CM | POA: Diagnosis not present

## 2022-07-08 DIAGNOSIS — I071 Rheumatic tricuspid insufficiency: Secondary | ICD-10-CM | POA: Diagnosis not present

## 2022-07-08 DIAGNOSIS — J9621 Acute and chronic respiratory failure with hypoxia: Secondary | ICD-10-CM | POA: Diagnosis not present

## 2022-07-09 DIAGNOSIS — Z6836 Body mass index (BMI) 36.0-36.9, adult: Secondary | ICD-10-CM | POA: Diagnosis not present

## 2022-07-09 DIAGNOSIS — J441 Chronic obstructive pulmonary disease with (acute) exacerbation: Secondary | ICD-10-CM | POA: Diagnosis not present

## 2022-07-09 DIAGNOSIS — J9621 Acute and chronic respiratory failure with hypoxia: Secondary | ICD-10-CM | POA: Diagnosis not present

## 2022-07-09 DIAGNOSIS — I5032 Chronic diastolic (congestive) heart failure: Secondary | ICD-10-CM | POA: Diagnosis not present

## 2022-07-09 DIAGNOSIS — M25511 Pain in right shoulder: Secondary | ICD-10-CM | POA: Diagnosis not present

## 2022-07-09 DIAGNOSIS — R1312 Dysphagia, oropharyngeal phase: Secondary | ICD-10-CM | POA: Diagnosis not present

## 2022-07-09 DIAGNOSIS — U071 COVID-19: Secondary | ICD-10-CM | POA: Diagnosis not present

## 2022-07-09 DIAGNOSIS — R519 Headache, unspecified: Secondary | ICD-10-CM | POA: Diagnosis not present

## 2022-07-09 DIAGNOSIS — R0789 Other chest pain: Secondary | ICD-10-CM | POA: Diagnosis not present

## 2022-07-09 DIAGNOSIS — I272 Pulmonary hypertension, unspecified: Secondary | ICD-10-CM | POA: Diagnosis not present

## 2022-07-09 DIAGNOSIS — I071 Rheumatic tricuspid insufficiency: Secondary | ICD-10-CM | POA: Diagnosis not present

## 2022-07-09 DIAGNOSIS — I2721 Secondary pulmonary arterial hypertension: Secondary | ICD-10-CM | POA: Diagnosis not present

## 2022-07-10 DIAGNOSIS — R1312 Dysphagia, oropharyngeal phase: Secondary | ICD-10-CM | POA: Diagnosis not present

## 2022-07-10 DIAGNOSIS — M25511 Pain in right shoulder: Secondary | ICD-10-CM | POA: Diagnosis not present

## 2022-07-10 DIAGNOSIS — I5032 Chronic diastolic (congestive) heart failure: Secondary | ICD-10-CM | POA: Diagnosis not present

## 2022-07-10 DIAGNOSIS — J441 Chronic obstructive pulmonary disease with (acute) exacerbation: Secondary | ICD-10-CM | POA: Diagnosis not present

## 2022-07-10 DIAGNOSIS — U071 COVID-19: Secondary | ICD-10-CM | POA: Diagnosis not present

## 2022-07-10 DIAGNOSIS — J9621 Acute and chronic respiratory failure with hypoxia: Secondary | ICD-10-CM | POA: Diagnosis not present

## 2022-07-10 DIAGNOSIS — R0789 Other chest pain: Secondary | ICD-10-CM | POA: Diagnosis not present

## 2022-07-10 DIAGNOSIS — I071 Rheumatic tricuspid insufficiency: Secondary | ICD-10-CM | POA: Diagnosis not present

## 2022-07-10 DIAGNOSIS — I272 Pulmonary hypertension, unspecified: Secondary | ICD-10-CM | POA: Diagnosis not present

## 2022-07-10 DIAGNOSIS — Z6836 Body mass index (BMI) 36.0-36.9, adult: Secondary | ICD-10-CM | POA: Diagnosis not present

## 2022-07-10 DIAGNOSIS — R519 Headache, unspecified: Secondary | ICD-10-CM | POA: Diagnosis not present

## 2022-07-10 DIAGNOSIS — I2721 Secondary pulmonary arterial hypertension: Secondary | ICD-10-CM | POA: Diagnosis not present

## 2022-07-11 DIAGNOSIS — J9612 Chronic respiratory failure with hypercapnia: Secondary | ICD-10-CM | POA: Diagnosis not present

## 2022-07-11 DIAGNOSIS — K219 Gastro-esophageal reflux disease without esophagitis: Secondary | ICD-10-CM | POA: Diagnosis not present

## 2022-07-11 DIAGNOSIS — M19011 Primary osteoarthritis, right shoulder: Secondary | ICD-10-CM | POA: Diagnosis not present

## 2022-07-11 DIAGNOSIS — G8929 Other chronic pain: Secondary | ICD-10-CM | POA: Diagnosis not present

## 2022-07-11 DIAGNOSIS — J449 Chronic obstructive pulmonary disease, unspecified: Secondary | ICD-10-CM | POA: Diagnosis not present

## 2022-07-11 DIAGNOSIS — J9611 Chronic respiratory failure with hypoxia: Secondary | ICD-10-CM | POA: Diagnosis not present

## 2022-07-11 DIAGNOSIS — I4892 Unspecified atrial flutter: Secondary | ICD-10-CM | POA: Diagnosis not present

## 2022-07-11 DIAGNOSIS — F39 Unspecified mood [affective] disorder: Secondary | ICD-10-CM | POA: Diagnosis not present

## 2022-07-11 DIAGNOSIS — F17210 Nicotine dependence, cigarettes, uncomplicated: Secondary | ICD-10-CM | POA: Diagnosis not present

## 2022-07-11 DIAGNOSIS — E669 Obesity, unspecified: Secondary | ICD-10-CM | POA: Diagnosis not present

## 2022-07-11 DIAGNOSIS — J9621 Acute and chronic respiratory failure with hypoxia: Secondary | ICD-10-CM | POA: Diagnosis not present

## 2022-07-11 DIAGNOSIS — I2510 Atherosclerotic heart disease of native coronary artery without angina pectoris: Secondary | ICD-10-CM | POA: Diagnosis not present

## 2022-07-11 DIAGNOSIS — I071 Rheumatic tricuspid insufficiency: Secondary | ICD-10-CM | POA: Diagnosis not present

## 2022-07-11 DIAGNOSIS — I5032 Chronic diastolic (congestive) heart failure: Secondary | ICD-10-CM | POA: Diagnosis not present

## 2022-07-11 DIAGNOSIS — K269 Duodenal ulcer, unspecified as acute or chronic, without hemorrhage or perforation: Secondary | ICD-10-CM | POA: Diagnosis not present

## 2022-07-11 DIAGNOSIS — J310 Chronic rhinitis: Secondary | ICD-10-CM | POA: Diagnosis not present

## 2022-07-11 DIAGNOSIS — R69 Illness, unspecified: Secondary | ICD-10-CM | POA: Diagnosis not present

## 2022-07-11 DIAGNOSIS — N281 Cyst of kidney, acquired: Secondary | ICD-10-CM | POA: Diagnosis not present

## 2022-07-11 DIAGNOSIS — M5116 Intervertebral disc disorders with radiculopathy, lumbar region: Secondary | ICD-10-CM | POA: Diagnosis not present

## 2022-07-11 DIAGNOSIS — U071 COVID-19: Secondary | ICD-10-CM | POA: Diagnosis not present

## 2022-07-11 DIAGNOSIS — I272 Pulmonary hypertension, unspecified: Secondary | ICD-10-CM | POA: Diagnosis not present

## 2022-07-11 DIAGNOSIS — G43909 Migraine, unspecified, not intractable, without status migrainosus: Secondary | ICD-10-CM | POA: Diagnosis not present

## 2022-07-11 DIAGNOSIS — I110 Hypertensive heart disease with heart failure: Secondary | ICD-10-CM | POA: Diagnosis not present

## 2022-07-11 DIAGNOSIS — D500 Iron deficiency anemia secondary to blood loss (chronic): Secondary | ICD-10-CM | POA: Diagnosis not present

## 2022-07-11 NOTE — Chronic Care Management (AMB) (Signed)
  Care Coordination   Note   07/11/2022 Name: [name] MRN: [id number] DOB: Nov 21, 1955  Theresa [name] is a 66 y.o. year old female who sees Fisher, Kirstie Peri, MD for primary care. I reached out to [name] by phone today to offer care coordination services.  Rescheduled   [name] was given information about Care Coordination services today including:   The Care Coordination services include support from the care team which includes your Nurse Coordinator, Clinical Social Worker, or Pharmacist.  The Care Coordination team is here to help remove barriers to the health concerns and goals most important to you. Care Coordination services are voluntary, and the patient may decline or stop services at any time by request to their care team member.   Care Coordination Consent Status: Patient agreed to services and verbal consent obtained.   Follow up plan:  Telephone appointment with care coordination team member scheduled for:  07/15/2022  Encounter Outcome:  Pt. Scheduled  Julian Hy, [place] Direct Dial: [phone]

## 2022-07-12 ENCOUNTER — Telehealth: Payer: Self-pay | Admitting: Family Medicine

## 2022-07-12 NOTE — Telephone Encounter (Signed)
Spoke with Tabitha at [facility]. Verbal orders were taken.

## 2022-07-12 NOTE — Telephone Encounter (Unsigned)
Copied from [place] [phone]. Topic: Quick Communication - Home Health Verbal Orders >> Jul 12, 2022  9:29 AM Cyndi Bender wrote: Caller/Agency: Kerry Hough with [facility] Callback Number: [phone] Requesting OT/PT/Skilled Nursing/Social Work/Speech Therapy: skilled nursing  Frequency: 1 x 4 weeks, 1 x 7 weeks, and 1 prn visit

## 2022-07-12 NOTE — Telephone Encounter (Signed)
That's fine

## 2022-07-13 ENCOUNTER — Ambulatory Visit: Payer: Medicare HMO | Admitting: Internal Medicine

## 2022-07-13 NOTE — Progress Notes (Deleted)
HPI  Patient presents to clinic today to establish care and for management of the conditions listed below.  COPD with CHRF: Managed with Trelegy and Albuterol.  There are no PFTs on file.  She follows with pulmonology.  CHF: Managed with Metoprolol, Metolazone, Torsemide and Potassium.  Echo from 04/2022 reviewed.  She follows with cardiology.  Anxiety and Depression: Chronic, managed with Sertraline and Buspirone.  She is not currently seeing a therapist.  She denies SI/HI.  A-fib: She is taking Metoprolol, Amiodarone and Xarelto as prescribed.  ECG from 06/2022 reviewed.  She follows with cardiology.  Hypothyroidism: She denies any issues on her current dose of Levothyroxine.  She does not follow with endocrinology.  Chronic Pain: Multiple joints.  She is taking Hydrocodone as prescribed by pain management.  GERD: Triggered by.  She denies breakthrough on Pantoprazole.  Upper GI from 06/2022 reviewed.  HLD with Aortic Atherosclerosis: Her last LDL was 86, triglycerides 58, 03/2022.  She is not currently taking any statin medications.  She is on Xarelto.  She does not consume a low-fat diet.  Prediabetes: Her last A1c was 5.7%, 03/2022.  She is taking Jardiance as prescribed.  She does not check her sugars.  Frequent Headaches: Triggered by.  She takes Fioricet as needed with good relief of symptoms.  She does not follow with neurology.  Anemia: Her last H/H was 8.4/29, 06/2022.  She is not taking any oral iron at this time.  She does not follow with hematology.  Past Medical History:  Diagnosis Date   Acute postoperative pain 10/03/2017   Anxiety    BiPAP (biphasic positive airway pressure) dependence    at hs   Carpal tunnel syndrome    CHF (congestive heart failure) ([place])    1/18   CHF (congestive heart failure) (HCC)    Chronic generalized abdominal pain    Chronic rhinitis    COPD (chronic obstructive pulmonary disease) (HCC)    DDD (degenerative disc disease), cervical    DDD  (degenerative disc disease), lumbosacral    Depression    Dyspnea    Edema    Flu    1/18   Gastritis    GERD (gastroesophageal reflux disease)    Hematuria    Hernia of abdominal wall 07/17/2018   Hernia, abdominal    Hypertension    Kidney stones    Low back pain    Lumbar radiculitis    Malodorous urine    Muscle weakness    Obesity    Oxygen dependent    Pneumonia due to COVID-19 virus 02/03/2020   Renal cyst    Sensory urge incontinence    Thyroid activity decreased    9/19   Tobacco abuse    Wheezing     Current Outpatient Medications  Medication Sig Dispense Refill   acetaminophen (TYLENOL) 500 MG tablet Take 1,000 mg by mouth every 8 (eight) hours as needed.     albuterol (VENTOLIN HFA) 108 (90 Base) MCG/ACT inhaler Inhale 2 puffs into the lungs every 6 (six) hours as needed for wheezing or shortness of breath. 8 g 1   ambrisentan (LETAIRIS) 5 MG tablet Take 5 mg by mouth daily.     amiodarone (PACERONE) 200 MG tablet Take 1 tablet (200 mg total) by mouth daily. 30 tablet 0   Besifloxacin HCl (BESIVANCE) 0.6 % SUSP Place 1 drop into the right eye 3 (three) times daily. For 2 weeks starting 03/29/22 (Patient not taking: Reported on 05/18/2022)  Bromfenac Sodium (PROLENSA) 0.07 % SOLN Place 1 drop into the right eye at bedtime. For 4 weeks starting 03/29/22 (Patient not taking: Reported on 05/18/2022)     busPIRone (BUSPAR) 5 MG tablet Take 1 tablet (5 mg total) by mouth 2 (two) times daily. 180 tablet 1   butalbital-acetaminophen-caffeine (FIORICET) 50-325-40 MG tablet Take 1 tablet by mouth every 6 (six) hours as needed for headache. LIMIT USE TO PREVENT MEDICATION OVERUSE HEADACHE 14 tablet 0   Cholecalciferol 50 MCG (2000 UT) CAPS Take 2,000 Units by mouth daily.      Difluprednate 0.05 % EMUL Place 1 drop into the right eye in the morning, at noon, in the evening, and at bedtime. One drop 4 times daily for one week starting 03/29/22. One drop 3 times daily for one  week. One drop 2 times daily for one week. One drop once daily for one week, then stop. (Patient not taking: Reported on 05/18/2022)     diltiazem (CARDIZEM CD) 180 MG 24 hr capsule Take 180 mg by mouth daily.     empagliflozin (JARDIANCE) 10 MG TABS tablet Take 1 tablet (10 mg total) by mouth daily before breakfast. 30 tablet 5   Fluticasone-Umeclidin-Vilant 100-62.5-25 MCG/INH AEPB Inhale 1 puff into the lungs daily.     HYDROcodone-acetaminophen (NORCO/VICODIN) 5-325 MG tablet Take 1 tablet by mouth every 8 (eight) hours as needed for severe pain. Must last 30 days 90 tablet 0   ipratropium-albuterol (DUONEB) 0.5-2.5 (3) MG/3ML SOLN USE 1 VIAL VIA NEBULIZER EVERY 6 HOURS AS NEEDED FOR SHORTNESS OF BREATH OR WHEEZING 1080 mL 0   levothyroxine (SYNTHROID) 25 MCG tablet TAKE 1 TABLET(25 MCG) BY MOUTH DAILY BEFORE BREAKFAST (Patient not taking: Reported on 05/18/2022) 90 tablet 1   metolazone (ZAROXOLYN) 2.5 MG tablet Take 1 tablet (2.5 mg total) by mouth 3 (three) times a week. On M, W, F 90 tablet 3   metoprolol succinate (TOPROL XL) 25 MG 24 hr tablet Take 1 tablet (25 mg total) by mouth daily. 30 tablet 0   nicotine (NICODERM CQ - DOSED IN MG/24 HOURS) 21 mg/24hr patch Place 1 patch (21 mg total) onto the skin daily. (Patient not taking: Reported on 05/18/2022) 28 patch 0   OXYGEN Inhale 2.5 L/min into the lungs.     pantoprazole (PROTONIX) 40 MG tablet Take 1 tablet (40 mg total) by mouth daily. 30 tablet 2   potassium chloride SA (KLOR-CON M) 20 MEQ tablet Take 2 tablets every morning and 1 tablet every evening 270 tablet 3   rivaroxaban (XARELTO) 20 MG TABS tablet Take 1 tablet (20 mg total) by mouth daily with supper. 30 tablet 0   roflumilast (DALIRESP) 500 MCG TABS tablet Take 500 mcg by mouth daily.     sertraline (ZOLOFT) 100 MG tablet TAKE 2 TABLETS BY MOUTH ONCE EVERY MORNING 180 tablet 1   simvastatin (ZOCOR) 20 MG tablet Take 20 mg by mouth at bedtime. (Patient not taking: Reported on  06/19/2022)     torsemide (DEMADEX) 20 MG tablet Take 2 tablets (40 mg total) by mouth 2 (two) times daily. Take 4 tablets QAM and 2 tablets QPM (Patient taking differently: Take 40-80 mg by mouth 2 (two) times daily. Take 4 tablets QAM and 2 tablets QPM)     No current facility-administered medications for this visit.    Allergies  Allergen Reactions   Gabapentin Other (See Comments)    Higher doses of 800 mg, 600 mg, 300 mg causes excessive sedation  and contributes to numerous mechanical falls. (Unsure if 100 mg dose can be tolerated)   Codeine Nausea And Vomiting   Meloxicam Other (See Comments)    Stomach pain    Family History  Problem Relation Age of Onset   Heart disease Mother    Stroke Mother    Coronary artery disease Mother    Lung cancer Sister    Cancer Sister    Breast cancer Sister    Alcohol abuse Father    Heart disease Father    Cancer Brother    Cancer Brother    Pneumonia Brother    Prostate cancer Neg Hx    Kidney cancer Neg Hx    Bladder Cancer Neg Hx     Social History   Socioeconomic History   Marital status: Married    Spouse name: Ovid Curd   Number of children: 2   Years of education: Not on file   Highest education level: 12th grade  Occupational History   Occupation: disability  Tobacco Use   Smoking status: Every Day    Packs/day: 0.50    Years: 44.00    Total pack years: 22.00    Types: Cigarettes   Smokeless tobacco: Never   Tobacco comments:    over a half pack  Vaping Use   Vaping Use: Never used  Substance and Sexual Activity   Alcohol use: No   Drug use: Yes    Comment: prescribed hydrocodone   Sexual activity: Not Currently    Birth control/protection: Surgical  Other Topics Concern   Not on file  Social History Narrative   ** Merged History Encounter **       Lives with spouse    Social Determinants of Health   Financial Resource Strain: Low Risk  (09/20/2021)   Overall Financial Resource Strain (CARDIA)     Difficulty of Paying Living Expenses: Not very hard  Food Insecurity: No Food Insecurity (12/14/2021)   Hunger Vital Sign    Worried About Running Out of Food in the Last Year: Never true    Ran Out of Food in the Last Year: Never true  Transportation Needs: No Transportation Needs (12/14/2021)   PRAPARE - Transportation    Lack of Transportation (Medical): No    Lack of Transportation (Non-Medical): No  Physical Activity: Inactive (09/20/2021)   Exercise Vital Sign    Days of Exercise per Week: 0 days    Minutes of Exercise per Session: 0 min  Stress: No Stress Concern Present (09/20/2021)   [place]    Feeling of Stress : Not at all  Social Connections: Moderately Integrated (09/20/2021)   Social Connection and Isolation Panel [NHANES]    Frequency of Communication with Friends and Family: More than three times a week    Frequency of Social Gatherings with Friends and Family: Once a week    Attends Religious Services: More than 4 times per year    Active Member of [employer] or Organizations: No    Attends Archivist Meetings: Never    Marital Status: Married  Human resources officer Violence: Not At Risk (01/22/2020)   Humiliation, Afraid, Rape, and Kick questionnaire    Fear of Current or Ex-Partner: No    Emotionally Abused: No    Physically Abused: No    Sexually Abused: No    ROS:  Constitutional: Patient reports intermittent headaches.  Denies fever, malaise, fatigue, or abrupt weight changes.  HEENT: Denies eye pain,  eye redness, ear pain, ringing in the ears, wax buildup, runny nose, nasal congestion, bloody nose, or sore throat. Respiratory: Denies difficulty breathing, shortness of breath, cough or sputum production.   Cardiovascular: Denies chest pain, chest tightness, palpitations or swelling in the hands or feet.  Gastrointestinal: Denies abdominal pain, bloating, constipation, diarrhea or blood in the  stool.  GU: Denies frequency, urgency, pain with urination, blood in urine, odor or discharge. Musculoskeletal: Patient reports chronic joint pain.  Denies decrease in range of motion, difficulty with gait, muscle pain or joint swelling.  Skin: Denies redness, rashes, lesions or ulcercations.  Neurological: Denies dizziness, difficulty with memory, difficulty with speech or problems with balance and coordination.  Psych: Patient has a history of anxiety and depression.  Denies SI/HI.  No other specific complaints in a complete review of systems (except as listed in HPI above).  PE:  There were no vitals taken for this visit. Wt Readings from Last 3 Encounters:  06/22/22 210 lb 1.6 oz (95.3 kg)  05/18/22 197 lb 4 oz (89.5 kg)  04/24/22 192 lb 7.4 oz (87.3 kg)    General: Appears their stated age, well developed, well nourished in NAD. HEENT: Head: normal shape and size; Eyes: sclera white, no icterus, conjunctiva pink, PERRLA and EOMs intact; Ears: Tm's gray and intact, normal light reflex;Throat/Mouth: Teeth present, mucosa pink and moist, no lesions or ulcerations noted.  Neck: Neck supple, trachea midline. No masses, lumps or thyromegaly present.  Cardiovascular: Normal rate and rhythm. S1,S2 noted.  No murmur, rubs or gallops noted. No JVD or BLE edema. No carotid bruits noted. Pulmonary/Chest: Normal effort and positive vesicular breath sounds. No respiratory distress. No wheezes, rales or ronchi noted.  Abdomen: Soft and nontender. Normal bowel sounds, no bruits noted. No distention or masses noted. Liver, spleen and kidneys non palpable. Musculoskeletal: Normal range of motion. Strength 5/5 BUE/BLE. No signs of joint swelling. No difficulty with gait.  Neurological: Alert and oriented. Cranial nerves II-XII grossly intact. Coordination normal.  Psychiatric: Mood and affect normal. Behavior is normal. Judgment and thought content normal.    BMET    Component Value Date/Time   NA  137 06/21/2022 0417   NA 140 04/14/2022 1154   NA 141 02/02/2015 0415   K 4.3 06/21/2022 0417   K 3.9 02/02/2015 0415   CL 103 06/21/2022 0417   CL 105 02/02/2015 0415   CO2 29 06/21/2022 0417   CO2 30 02/02/2015 0415   GLUCOSE 94 06/21/2022 0417   GLUCOSE 108 (H) 02/02/2015 0415   BUN 10 06/21/2022 0417   BUN 26 04/14/2022 1154   BUN 19 02/02/2015 0415   CREATININE 0.72 06/21/2022 0417   CREATININE 0.70 02/02/2015 0415   CALCIUM 9.1 06/21/2022 0417   CALCIUM 8.7 (L) 02/02/2015 0415   GFRNONAA >60 06/21/2022 0417   GFRNONAA >60 02/02/2015 0415   GFRAA >60 06/26/2020 1108   GFRAA >60 02/02/2015 0415    Lipid Panel     Component Value Date/Time   CHOL 175 04/08/2022 0956   CHOL 241 (H) 06/10/2021 1111   TRIG 154 (H) 04/08/2022 0956   HDL 58 04/08/2022 0956   HDL 36 (L) 06/10/2021 1111   CHOLHDL 3.0 04/08/2022 0956   VLDL 31 04/08/2022 0956   LDLCALC 86 04/08/2022 0956   LDLCALC 171 (H) 06/10/2021 1111    CBC    Component Value Date/Time   WBC 6.5 06/21/2022 0417   RBC 2.99 (L) 06/21/2022 0417   HGB 8.4 (  L) 06/22/2022 0811   HGB 14.3 04/14/2022 1154   HCT 29.0 (L) 06/22/2022 0811   HCT 44.6 04/14/2022 1154   PLT 224 06/21/2022 0417   PLT 167 04/14/2022 1154   MCV 87.0 06/21/2022 0417   MCV 98 (H) 04/14/2022 1154   MCV 98 02/02/2015 0415   MCH 25.1 (L) 06/21/2022 0417   MCHC 28.8 (L) 06/21/2022 0417   RDW 18.0 (H) 06/21/2022 0417   RDW 15.0 04/14/2022 1154   RDW 14.8 (H) 02/02/2015 0415   LYMPHSABS 0.7 04/14/2022 1154   LYMPHSABS 1.9 02/02/2015 0415   MONOABS 0.4 04/01/2022 0803   MONOABS 0.7 02/02/2015 0415   EOSABS 0.0 04/14/2022 1154   EOSABS 0.2 02/02/2015 0415   BASOSABS 0.0 04/14/2022 1154   BASOSABS 0.0 02/02/2015 0415    Hgb A1C Lab Results  Component Value Date   HGBA1C 5.7 (H) 04/08/2022     Assessment and Plan:    Webb Silversmith, NP

## 2022-07-14 DIAGNOSIS — R0600 Dyspnea, unspecified: Secondary | ICD-10-CM | POA: Diagnosis not present

## 2022-07-14 DIAGNOSIS — R262 Difficulty in walking, not elsewhere classified: Secondary | ICD-10-CM | POA: Diagnosis not present

## 2022-07-14 DIAGNOSIS — J449 Chronic obstructive pulmonary disease, unspecified: Secondary | ICD-10-CM | POA: Diagnosis not present

## 2022-07-15 ENCOUNTER — Ambulatory Visit: Payer: Self-pay

## 2022-07-18 ENCOUNTER — Encounter: Payer: Medicare HMO | Admitting: Pain Medicine

## 2022-07-18 DIAGNOSIS — I4891 Unspecified atrial fibrillation: Secondary | ICD-10-CM | POA: Diagnosis not present

## 2022-07-18 DIAGNOSIS — I21A1 Myocardial infarction type 2: Secondary | ICD-10-CM | POA: Diagnosis not present

## 2022-07-18 DIAGNOSIS — R1110 Vomiting, unspecified: Secondary | ICD-10-CM | POA: Diagnosis not present

## 2022-07-18 DIAGNOSIS — R140 Abdominal distension (gaseous): Secondary | ICD-10-CM | POA: Diagnosis not present

## 2022-07-18 DIAGNOSIS — R69 Illness, unspecified: Secondary | ICD-10-CM | POA: Diagnosis not present

## 2022-07-18 DIAGNOSIS — R109 Unspecified abdominal pain: Secondary | ICD-10-CM | POA: Diagnosis not present

## 2022-07-18 DIAGNOSIS — Z7901 Long term (current) use of anticoagulants: Secondary | ICD-10-CM | POA: Diagnosis not present

## 2022-07-18 DIAGNOSIS — R0600 Dyspnea, unspecified: Secondary | ICD-10-CM | POA: Diagnosis not present

## 2022-07-19 ENCOUNTER — Ambulatory Visit: Payer: Medicare HMO | Admitting: Internal Medicine

## 2022-07-19 DIAGNOSIS — K264 Chronic or unspecified duodenal ulcer with hemorrhage: Secondary | ICD-10-CM | POA: Diagnosis not present

## 2022-07-19 DIAGNOSIS — E785 Hyperlipidemia, unspecified: Secondary | ICD-10-CM | POA: Diagnosis not present

## 2022-07-19 DIAGNOSIS — I110 Hypertensive heart disease with heart failure: Secondary | ICD-10-CM | POA: Diagnosis not present

## 2022-07-19 DIAGNOSIS — I5032 Chronic diastolic (congestive) heart failure: Secondary | ICD-10-CM | POA: Diagnosis not present

## 2022-07-19 DIAGNOSIS — M25511 Pain in right shoulder: Secondary | ICD-10-CM | POA: Diagnosis not present

## 2022-07-19 DIAGNOSIS — E876 Hypokalemia: Secondary | ICD-10-CM | POA: Diagnosis not present

## 2022-07-19 DIAGNOSIS — R109 Unspecified abdominal pain: Secondary | ICD-10-CM | POA: Diagnosis not present

## 2022-07-19 DIAGNOSIS — I21A1 Myocardial infarction type 2: Secondary | ICD-10-CM | POA: Diagnosis not present

## 2022-07-19 DIAGNOSIS — I4891 Unspecified atrial fibrillation: Secondary | ICD-10-CM | POA: Diagnosis not present

## 2022-07-19 DIAGNOSIS — D500 Iron deficiency anemia secondary to blood loss (chronic): Secondary | ICD-10-CM | POA: Diagnosis not present

## 2022-07-19 DIAGNOSIS — J449 Chronic obstructive pulmonary disease, unspecified: Secondary | ICD-10-CM | POA: Diagnosis not present

## 2022-07-19 DIAGNOSIS — Z7901 Long term (current) use of anticoagulants: Secondary | ICD-10-CM | POA: Diagnosis not present

## 2022-07-19 DIAGNOSIS — F32A Depression, unspecified: Secondary | ICD-10-CM

## 2022-07-19 DIAGNOSIS — F329 Major depressive disorder, single episode, unspecified: Secondary | ICD-10-CM | POA: Diagnosis not present

## 2022-07-19 DIAGNOSIS — I5A Non-ischemic myocardial injury (non-traumatic): Secondary | ICD-10-CM | POA: Diagnosis not present

## 2022-07-19 DIAGNOSIS — M5136 Other intervertebral disc degeneration, lumbar region: Secondary | ICD-10-CM | POA: Diagnosis not present

## 2022-07-19 DIAGNOSIS — U071 COVID-19: Secondary | ICD-10-CM | POA: Diagnosis not present

## 2022-07-19 DIAGNOSIS — I2721 Secondary pulmonary arterial hypertension: Secondary | ICD-10-CM | POA: Diagnosis not present

## 2022-07-19 DIAGNOSIS — J9612 Chronic respiratory failure with hypercapnia: Secondary | ICD-10-CM | POA: Diagnosis not present

## 2022-07-19 DIAGNOSIS — K2970 Gastritis, unspecified, without bleeding: Secondary | ICD-10-CM | POA: Diagnosis not present

## 2022-07-19 DIAGNOSIS — K269 Duodenal ulcer, unspecified as acute or chronic, without hemorrhage or perforation: Secondary | ICD-10-CM | POA: Diagnosis not present

## 2022-07-19 DIAGNOSIS — I2510 Atherosclerotic heart disease of native coronary artery without angina pectoris: Secondary | ICD-10-CM | POA: Diagnosis not present

## 2022-07-19 DIAGNOSIS — R140 Abdominal distension (gaseous): Secondary | ICD-10-CM | POA: Diagnosis not present

## 2022-07-19 DIAGNOSIS — R1110 Vomiting, unspecified: Secondary | ICD-10-CM | POA: Diagnosis not present

## 2022-07-19 DIAGNOSIS — E871 Hypo-osmolality and hyponatremia: Secondary | ICD-10-CM | POA: Diagnosis not present

## 2022-07-19 DIAGNOSIS — F411 Generalized anxiety disorder: Secondary | ICD-10-CM | POA: Diagnosis not present

## 2022-07-19 DIAGNOSIS — J441 Chronic obstructive pulmonary disease with (acute) exacerbation: Secondary | ICD-10-CM | POA: Diagnosis not present

## 2022-07-19 DIAGNOSIS — E039 Hypothyroidism, unspecified: Secondary | ICD-10-CM | POA: Diagnosis not present

## 2022-07-19 DIAGNOSIS — I959 Hypotension, unspecified: Secondary | ICD-10-CM | POA: Diagnosis not present

## 2022-07-19 DIAGNOSIS — Z8616 Personal history of COVID-19: Secondary | ICD-10-CM | POA: Diagnosis not present

## 2022-07-19 DIAGNOSIS — R0600 Dyspnea, unspecified: Secondary | ICD-10-CM | POA: Diagnosis not present

## 2022-07-19 DIAGNOSIS — J9611 Chronic respiratory failure with hypoxia: Secondary | ICD-10-CM | POA: Diagnosis not present

## 2022-07-19 DIAGNOSIS — K219 Gastro-esophageal reflux disease without esophagitis: Secondary | ICD-10-CM | POA: Diagnosis not present

## 2022-07-19 DIAGNOSIS — R112 Nausea with vomiting, unspecified: Secondary | ICD-10-CM | POA: Diagnosis not present

## 2022-07-19 DIAGNOSIS — Z1152 Encounter for screening for COVID-19: Secondary | ICD-10-CM | POA: Diagnosis not present

## 2022-07-19 DIAGNOSIS — N200 Calculus of kidney: Secondary | ICD-10-CM | POA: Diagnosis not present

## 2022-07-19 DIAGNOSIS — G4733 Obstructive sleep apnea (adult) (pediatric): Secondary | ICD-10-CM | POA: Diagnosis not present

## 2022-07-19 DIAGNOSIS — J439 Emphysema, unspecified: Secondary | ICD-10-CM | POA: Diagnosis not present

## 2022-07-19 DIAGNOSIS — R69 Illness, unspecified: Secondary | ICD-10-CM | POA: Diagnosis not present

## 2022-07-20 ENCOUNTER — Ambulatory Visit: Payer: Medicare HMO | Admitting: Family

## 2022-07-21 ENCOUNTER — Encounter: Payer: Medicare HMO | Admitting: Pain Medicine

## 2022-07-22 DIAGNOSIS — J9610 Chronic respiratory failure, unspecified whether with hypoxia or hypercapnia: Secondary | ICD-10-CM | POA: Diagnosis not present

## 2022-07-22 DIAGNOSIS — J449 Chronic obstructive pulmonary disease, unspecified: Secondary | ICD-10-CM | POA: Diagnosis not present

## 2022-07-26 DIAGNOSIS — F17201 Nicotine dependence, unspecified, in remission: Secondary | ICD-10-CM | POA: Diagnosis not present

## 2022-07-26 DIAGNOSIS — R69 Illness, unspecified: Secondary | ICD-10-CM | POA: Diagnosis not present

## 2022-07-27 DIAGNOSIS — I272 Pulmonary hypertension, unspecified: Secondary | ICD-10-CM | POA: Diagnosis not present

## 2022-07-27 DIAGNOSIS — J449 Chronic obstructive pulmonary disease, unspecified: Secondary | ICD-10-CM | POA: Diagnosis not present

## 2022-07-27 DIAGNOSIS — Z9981 Dependence on supplemental oxygen: Secondary | ICD-10-CM | POA: Diagnosis not present

## 2022-07-27 DIAGNOSIS — R0689 Other abnormalities of breathing: Secondary | ICD-10-CM | POA: Diagnosis not present

## 2022-07-28 DIAGNOSIS — J449 Chronic obstructive pulmonary disease, unspecified: Secondary | ICD-10-CM | POA: Diagnosis not present

## 2022-07-28 DIAGNOSIS — R262 Difficulty in walking, not elsewhere classified: Secondary | ICD-10-CM | POA: Diagnosis not present

## 2022-07-28 DIAGNOSIS — R0600 Dyspnea, unspecified: Secondary | ICD-10-CM | POA: Diagnosis not present

## 2022-07-30 NOTE — Progress Notes (Unsigned)
PROVIDER NOTE: Information contained herein reflects review and annotations entered in association with encounter. Interpretation of such information and data should be left to medically-trained personnel. Information provided to patient can be located elsewhere in the medical record under "Patient Instructions". Document created using STT-dictation technology, any transcriptional errors that may result from process are unintentional.    Patient: Theresa [name]  Service Category: E/M  Provider: Gaspar Cola, MD  DOB: March 24, 1956  DOS: 08/03/2022  Referring Provider: Birdie Sons, MD  MRN: [id number]  Specialty: Interventional Pain Management  PCP: Theresa Sons, MD  Type: Established Patient  Setting: Ambulatory outpatient    Location: Office  Delivery: Face-to-face     HPI  [name], a 66 y.o. year old female, is here today because of her No primary diagnosis found.. [name] primary complain today is No chief complaint on file. Last encounter: My last encounter with her was on 07/06/2022. Pertinent problems: [name] has Carpal tunnel syndrome; Cervical radiculitis (Right side); Chronic pain syndrome (significant psychosocial component); Chronic sacroiliac joint pain (Bilateral); Lumbosacral radiculopathy at L4; Chronic hip pain (3ry area of Pain) (Bilateral) (L>R); Chronic knee pain (Left); and Chronic shoulder pain (Bilateral) on their pertinent problem list. Pain Assessment: Severity of   is reported as a  /10. Location:    / . Onset:  . Quality:  . Timing:  . Modifying factor(s):  Marland Kitchen Vitals:  vitals were not taken for this visit.   Reason for encounter:  *** . ***  Pharmacotherapy Assessment  Analgesic: Hydrocodone/APAP 5/325 one tablet every 8 hours (15 mg/day of hydrocodone) MME/day: 15 mg/day.   Monitoring: [place] PMP: PDMP reviewed during this encounter.       Pharmacotherapy: No side-effects or adverse reactions reported. Compliance: No problems  identified. Effectiveness: Clinically acceptable.  No notes on file  No results found for: "CBDTHCR" No results found for: "D8THCCBX" No results found for: "D9THCCBX"  UDS:  Summary  Date Value Ref Range Status  10/27/2021 Note  Final    Comment:    ==================================================================== [facility] (MW) ==================================================================== Test                             Result       Flag       Units  Drug Present and Declared for Prescription Verification   Hydrocodone                    653          EXPECTED   ng/mg creat   Hydromorphone                  76           EXPECTED   ng/mg creat   Dihydrocodeine                 125          EXPECTED   ng/mg creat   Norhydrocodone                 625          EXPECTED   ng/mg creat    Sources of hydrocodone include scheduled prescription medications.    Hydromorphone, dihydrocodeine and norhydrocodone are expected    metabolites of hydrocodone. Hydromorphone and dihydrocodeine are    also available as scheduled prescription medications.  ==================================================================== Test  Result    Flag   Units      Ref Range   Creatinine              72               mg/dL      >=20 ==================================================================== Declared Medications:  The flagging and interpretation on this report are based on the  following declared medications.  Unexpected results may arise from  inaccuracies in the declared medications.   **Note: The testing scope of this panel includes these medications:   Hydrocodone (Norco)   **Note: The testing scope of this panel does not include the  following reported medications:   Acetaminophen (Norco)  Albuterol (Ventolin HFA)  Albuterol (Duoneb)  Aspirin  Cholecalciferol  Fluticasone  Ipratropium (Duoneb)  Levothyroxine (Synthroid)  Potassium (Klor-Con)   Roflumilast (Daliresp)  Sertraline (Zoloft)  Simvastatin (Zocor)  Torsemide (Demadex)  Umeclidinium  Vilanterol ==================================================================== For clinical consultation, please call [phone]. ====================================================================       ROS  Constitutional: Denies any fever or chills Gastrointestinal: No reported hemesis, hematochezia, vomiting, or acute GI distress Musculoskeletal: Denies any acute onset joint swelling, redness, loss of ROM, or weakness Neurological: No reported episodes of acute onset apraxia, aphasia, dysarthria, agnosia, amnesia, paralysis, loss of coordination, or loss of consciousness  Medication Review  Besifloxacin HCl, Bromfenac Sodium, Cholecalciferol, Difluprednate, Fluticasone-Umeclidin-Vilant, HYDROcodone-acetaminophen, Oxygen-Helium, acetaminophen, albuterol, ambrisentan, amiodarone, busPIRone, butalbital-acetaminophen-caffeine, diltiazem, empagliflozin, ipratropium-albuterol, levothyroxine, metolazone, metoprolol succinate, nicotine, pantoprazole, potassium chloride SA, rivaroxaban, roflumilast, sertraline, simvastatin, and torsemide  History Review  Allergy: [name] is allergic to gabapentin, codeine, and meloxicam. Drug: [name]  reports current drug use. Alcohol:  reports no history of alcohol use. Tobacco:  reports that she has been smoking cigarettes. She has a 22.00 pack-year smoking history. She has never used smokeless tobacco. Social: Theresa [name]  reports that she has been smoking cigarettes. She has a 22.00 pack-year smoking history. She has never used smokeless tobacco. She reports current drug use. She reports that she does not drink alcohol. Medical:  has a past medical history of Acute postoperative pain (10/03/2017), Anxiety, BiPAP (biphasic positive airway pressure) dependence, Carpal tunnel syndrome, CHF (congestive heart failure) ([place]), CHF (congestive heart  failure) ([place]), Chronic generalized abdominal pain, Chronic rhinitis, COPD (chronic obstructive pulmonary disease) ([place]), DDD (degenerative disc disease), cervical, DDD (degenerative disc disease), lumbosacral, Depression, Dyspnea, Edema, Flu, Gastritis, GERD (gastroesophageal reflux disease), Hematuria, Hernia of abdominal wall (07/17/2018), Hernia, abdominal, Hypertension, Kidney stones, Low back pain, Lumbar radiculitis, Malodorous urine, Muscle weakness, Obesity, Oxygen dependent, Pneumonia due to COVID-19 virus (02/03/2020), Renal cyst, Sensory urge incontinence, Thyroid activity decreased, Tobacco abuse, and Wheezing. Surgical: [name]  has a past surgical history that includes Abdominal hysterectomy; Tonsillectomy; Carpal tunnel release (Left, 2012); Tubal ligation; epigastric hernia repair (N/A, 08/13/2018); Umbilical hernia repair (N/A, 08/13/2018); RIGHT/LEFT HEART CATH AND CORONARY ANGIOGRAPHY (N/A, 07/11/2019); TEE without cardioversion (N/A, 04/29/2022); Cardioversion (N/A, 04/29/2022); and Esophagogastroduodenoscopy (N/A, 06/21/2022). Family: family history includes Alcohol abuse in her father; Breast cancer in her sister; Cancer in her brother, brother, and sister; Coronary artery disease in her mother; Heart disease in her father and mother; Lung cancer in her sister; Pneumonia in her brother; Stroke in her mother.  Laboratory Chemistry Profile   Renal Lab Results  Component Value Date   BUN 10 06/21/2022   CREATININE 0.72 06/21/2022   BCR 32 (H) 04/14/2022   GFRAA >60 06/26/2020   GFRNONAA >60 06/21/2022    Hepatic Lab Results  Component Value Date   AST 21 06/19/2022   ALT 18 06/19/2022   ALBUMIN 3.9 06/19/2022   ALKPHOS 73 06/19/2022   LIPASE 57 (H) 06/19/2022   AMMONIA 40 (H) 10/11/2019    Electrolytes Lab Results  Component Value Date   NA 137 06/21/2022   K 4.3 06/21/2022   CL 103 06/21/2022   CALCIUM 9.1 06/21/2022   MG 2.3 06/21/2022   PHOS 3.2 06/21/2022     Bone Lab Results  Component Value Date   25OHVITD1 13 (L) 09/17/2015   25OHVITD2 <1.0 09/17/2015   25OHVITD3 13 09/17/2015    Inflammation (CRP: Acute Phase) (ESR: Chronic Phase) Lab Results  Component Value Date   CRP 0.9 04/02/2022   ESRSEDRATE 17 04/06/2022   LATICACIDVEN 0.9 04/01/2022         Note: Above Lab results reviewed.  Recent Imaging Review  CT ABDOMEN PELVIS W CONTRAST CLINICAL DATA:  Epigastric pain  EXAM: CT ABDOMEN AND PELVIS WITH CONTRAST  TECHNIQUE: Multidetector CT imaging of the abdomen and pelvis was performed using the standard protocol following bolus administration of intravenous contrast.  RADIATION DOSE REDUCTION: This exam was performed according to the departmental dose-optimization program which includes automated exposure control, adjustment of the mA and/or kV according to patient size and/or use of iterative reconstruction technique.  CONTRAST:  175m OMNIPAQUE IOHEXOL 300 MG/ML  SOLN  COMPARISON:  10/31/2016  FINDINGS: Lower chest: Coronary artery calcifications are seen. Breathing motion limits evaluation of lower lung fields.  Hepatobiliary: There are a few scattered small low-density lesions, possibly cysts or hemangiomas. There is interval increase in number of these low-density foci. There is no dilation of bile ducts. Gallbladder is not distended.  Pancreas: No focal abnormalities are seen.  Spleen: Spleen measures 12.2 cm in maximum diameter.  Adrenals/Urinary Tract: There is a 9 mm nodular density in the left adrenal which has not changed significantly. Right adrenal is unremarkable. There is no hydronephrosis. There are a few small bilateral renal stones each measuring less than 4 mm. There is a 1.4 cm smooth marginated fluid density lesion in the upper pole of right kidney suggesting renal cyst. There is 8 mm lesion in the medial aspect of right kidney with density measurements ranging up to 92 Hounsfield units,  possibly hyperdense hemorrhagic cyst. Ureters are not dilated. Urinary bladder is unremarkable.  Stomach/Bowel: Stomach is not distended. Small bowel loops are unremarkable. Appendix is not dilated. There is no significant wall thickening in colon. There is no pericolic stranding.  Vascular/Lymphatic: Fairly extensive arterial calcifications are seen.  Reproductive: Unremarkable.  Other: There is no ascites or pneumoperitoneum. Small bilateral inguinal hernias containing fat are noted.  Musculoskeletal: Degenerative changes are noted in lumbar spine, most severe at L2-L3, L4-L5 and L5-S1 levels with encroachment of neural foramina.  IMPRESSION: There is no evidence of intestinal obstruction or pneumoperitoneum. Appendix is not dilated. There is no hydronephrosis.  There are few small bilateral renal stones. There is 1.4 cm cyst in the upper pole of right kidney. There is 8 mm hyperdense lesion, possibly hemorrhagic cyst in the medial right kidney. There are multiple low-density lesions in liver, possibly cysts or hemangiomas.  Lumbar spondylosis. Other findings as described in the body of the report.  Electronically Signed   By: PElmer PickerM.D.   On: 06/19/2022 13:11 DG Chest 2 View CLINICAL DATA:  Shortness of breath  EXAM: CHEST - 2 VIEW  COMPARISON:  Radiograph 04/26/2022, chest CT 04/09/2022  FINDINGS: Unchanged cardiomediastinal  silhouette with enlarged pulmonary arteries. Mild interstitial opacities. No large pleural effusion. No pneumothorax. No acute osseous abnormality. Thoracic spondylosis.  IMPRESSION: Mild interstitial opacities which could reflect mild edema or changes related to emphysema.  Electronically Signed   By: Maurine Simmering M.D.   On: 06/19/2022 11:37 Note: Reviewed        Physical Exam  General appearance: Well nourished, well developed, and well hydrated. In no apparent acute distress Mental status: Alert, oriented x 3 (person,  place, & time)       Respiratory: No evidence of acute respiratory distress Eyes: PERLA Vitals: There were no vitals taken for this visit. BMI: Estimated body mass index is 38.43 kg/m as calculated from the following:   Height as of 06/21/22: _0  (1.575 m).   Weight as of 06/22/22: 210 lb 1.6 oz (95.3 kg). Ideal: Patient weight not recorded  Assessment   Diagnosis Status  No diagnosis found. Controlled Controlled Controlled   Updated Problems: No problems updated.   Plan of Care  Problem-specific:  No problem-specific Assessment & Plan notes found for this encounter.  [name] has a current medication list which includes the following long-term medication(s): albuterol, amiodarone, hydrocodone-acetaminophen, ipratropium-albuterol, levothyroxine, metolazone, metoprolol succinate, pantoprazole, potassium chloride sa, rivaroxaban, sertraline, and torsemide.  Pharmacotherapy (Medications Ordered): No orders of the defined types were placed in this encounter.  Orders:  No orders of the defined types were placed in this encounter.  Follow-up plan:   No follow-ups on file.     Interventional Therapies  Risk  Complexity Considerations:   Estimated body mass index is 32.01 kg/m as calculated from the following:   Height as of this encounter: _1  (1.575 m).   Weight as of this encounter: 175 lb (79.4 kg). NOTE: NO further Lumbar RFA until BMI <35.   Planned  Pending:      Under consideration:   Diagnostic bilateral cervical facet MBB #1  Palliative left L2-3 LESI #4    Completed:   Palliative right lumbar facet MBB x5 (11/11/2021) (100/100/65/65)  Palliative left lumbar facet MBB x6 (11/11/2021) (100/100/0/0)  Therapeutic left L2-3 LESI x3 (09/23/2021) (80/80/80)  Diagnostic/palliative left L4 TFESI x1 (12/26/2017) (100/100/50/>50)  Diagnostic/palliative left L4-5 LESI x2 (07/30/2019) (100/100/0/0)  Diagnostic/palliative right L4 TFESI x1 (01/25/2018)  (100/100/50/50)  Diagnostic/palliative right L4-5 LESI x1 (01/25/2018) (100/100/50/50)  Palliative left lumbar facet RFA x1 (06/27/2017) (0/0/100/>50)  Palliative right lumbar facet RFA x1 (10/03/2017) (100/100/75)    Therapeutic  Palliative (PRN) options:   Therapeutic lumbar facet MBB   Therapeutic left L2 LESI  Therapeutic lumbar facet RFA       Recent Visits No visits were found meeting these conditions. Showing recent visits within past 90 days and meeting all other requirements Future Appointments Date Type Provider Dept  08/03/22 Appointment Milinda Pointer, MD [facility]  Showing future appointments within next 90 days and meeting all other requirements  I discussed the assessment and treatment plan with the patient. The patient was provided an opportunity to ask questions and all were answered. The patient agreed with the plan and demonstrated an understanding of the instructions.  Patient advised to call back or seek an in-person evaluation if the symptoms or condition worsens.  Duration of encounter: *** minutes.  Total time on encounter, as per AMA guidelines included both the face-to-face and non-face-to-face time personally spent by the physician and/or other qualified health care professional(s) on the day of the encounter (includes time in activities that require  the physician or other qualified health care professional and does not include time in activities normally performed by clinical staff). Physician's time may include the following activities when performed: preparing to see the patient (eg, review of tests, pre-charting review of records) obtaining and/or reviewing separately obtained history performing a medically appropriate examination and/or evaluation counseling and educating the patient/family/caregiver ordering medications, tests, or procedures referring and communicating with other health care professionals (when not separately  reported) documenting clinical information in the electronic or other health record independently interpreting results (not separately reported) and communicating results to the patient/ family/caregiver care coordination (not separately reported)  Note by: Theresa Cola, MD Date: 08/03/2022; Time: 9:59 AM

## 2022-08-01 DIAGNOSIS — K219 Gastro-esophageal reflux disease without esophagitis: Secondary | ICD-10-CM | POA: Diagnosis not present

## 2022-08-01 DIAGNOSIS — J439 Emphysema, unspecified: Secondary | ICD-10-CM | POA: Diagnosis not present

## 2022-08-01 DIAGNOSIS — I480 Paroxysmal atrial fibrillation: Secondary | ICD-10-CM | POA: Diagnosis not present

## 2022-08-01 DIAGNOSIS — I5032 Chronic diastolic (congestive) heart failure: Secondary | ICD-10-CM | POA: Diagnosis not present

## 2022-08-01 DIAGNOSIS — Z1231 Encounter for screening mammogram for malignant neoplasm of breast: Secondary | ICD-10-CM | POA: Diagnosis not present

## 2022-08-01 DIAGNOSIS — R7303 Prediabetes: Secondary | ICD-10-CM | POA: Diagnosis not present

## 2022-08-01 DIAGNOSIS — Z23 Encounter for immunization: Secondary | ICD-10-CM | POA: Diagnosis not present

## 2022-08-01 DIAGNOSIS — M5416 Radiculopathy, lumbar region: Secondary | ICD-10-CM | POA: Diagnosis not present

## 2022-08-01 DIAGNOSIS — K269 Duodenal ulcer, unspecified as acute or chronic, without hemorrhage or perforation: Secondary | ICD-10-CM | POA: Diagnosis not present

## 2022-08-01 DIAGNOSIS — I272 Pulmonary hypertension, unspecified: Secondary | ICD-10-CM | POA: Diagnosis not present

## 2022-08-01 DIAGNOSIS — Z1211 Encounter for screening for malignant neoplasm of colon: Secondary | ICD-10-CM | POA: Diagnosis not present

## 2022-08-01 DIAGNOSIS — R519 Headache, unspecified: Secondary | ICD-10-CM | POA: Diagnosis not present

## 2022-08-03 ENCOUNTER — Ambulatory Visit: Payer: Medicare HMO | Attending: Pain Medicine | Admitting: Pain Medicine

## 2022-08-03 ENCOUNTER — Encounter: Payer: Self-pay | Admitting: Pain Medicine

## 2022-08-03 VITALS — BP 115/57 | HR 79 | Temp 97.0°F | Resp 18 | Ht 62.0 in | Wt 200.0 lb

## 2022-08-03 DIAGNOSIS — M47816 Spondylosis without myelopathy or radiculopathy, lumbar region: Secondary | ICD-10-CM | POA: Diagnosis not present

## 2022-08-03 DIAGNOSIS — M79604 Pain in right leg: Secondary | ICD-10-CM | POA: Insufficient documentation

## 2022-08-03 DIAGNOSIS — Z7901 Long term (current) use of anticoagulants: Secondary | ICD-10-CM | POA: Diagnosis not present

## 2022-08-03 DIAGNOSIS — M25551 Pain in right hip: Secondary | ICD-10-CM | POA: Diagnosis not present

## 2022-08-03 DIAGNOSIS — M545 Low back pain, unspecified: Secondary | ICD-10-CM | POA: Insufficient documentation

## 2022-08-03 DIAGNOSIS — G8929 Other chronic pain: Secondary | ICD-10-CM | POA: Insufficient documentation

## 2022-08-03 DIAGNOSIS — G894 Chronic pain syndrome: Secondary | ICD-10-CM | POA: Insufficient documentation

## 2022-08-03 DIAGNOSIS — M5136 Other intervertebral disc degeneration, lumbar region: Secondary | ICD-10-CM | POA: Diagnosis not present

## 2022-08-03 DIAGNOSIS — M25552 Pain in left hip: Secondary | ICD-10-CM | POA: Diagnosis not present

## 2022-08-03 DIAGNOSIS — M79605 Pain in left leg: Secondary | ICD-10-CM | POA: Insufficient documentation

## 2022-08-03 DIAGNOSIS — M25562 Pain in left knee: Secondary | ICD-10-CM | POA: Diagnosis not present

## 2022-08-03 DIAGNOSIS — Z79899 Other long term (current) drug therapy: Secondary | ICD-10-CM | POA: Diagnosis not present

## 2022-08-03 DIAGNOSIS — Z79891 Long term (current) use of opiate analgesic: Secondary | ICD-10-CM | POA: Insufficient documentation

## 2022-08-03 MED ORDER — HYDROCODONE-ACETAMINOPHEN 5-325 MG PO TABS: 1.0000 | ORAL_TABLET | Freq: Three times a day (TID) | ORAL | 0 refills | Status: DC | PRN

## 2022-08-03 MED ORDER — NALOXONE HCL 4 MG/0.1ML NA LIQD: 1.0000 | NASAL | 0 refills | Status: DC | PRN

## 2022-08-03 NOTE — Patient Instructions (Addendum)
____________________________________________________________________________________________  Blood Thinners  IMPORTANT NOTICE:  If you take any of these, make sure to notify the nursing staff.  Failure to do so may result in injury.  Recommended time intervals to stop and restart blood-thinners, before & after invasive procedures  Generic Name Brand Name Pre-procedure. Stop this long before procedure. Post-procedure. Minimum waiting period before restarting.  Abciximab Reopro 15 days 2 hrs  Alteplase Activase 10 days 10 days  Anagrelide Agrylin    Apixaban Eliquis 3 days 6 hrs  Cilostazol Pletal 3 days 5 hrs  Clopidogrel Plavix 7-10 days 2 hrs  Dabigatran Pradaxa 5 days 6 hrs  Dalteparin Fragmin 24 hours 4 hrs  Dipyridamole Aggrenox 11days 2 hrs  Edoxaban Lixiana; Savaysa 3 days 2 hrs  Enoxaparin  Lovenox 24 hours 4 hrs  Eptifibatide Integrillin 8 hours 2 hrs  Fondaparinux  Arixtra 72 hours 12 hrs  Hydroxychloroquine Plaquenil 11 days   Prasugrel Effient 7-10 days 6 hrs  Reteplase Retavase 10 days 10 days  Rivaroxaban Xarelto 3 days 6 hrs  Ticagrelor Brilinta 5-7 days 6 hrs  Ticlopidine Ticlid 10-14 days 2 hrs  Tinzaparin Innohep 24 hours 4 hrs  Tirofiban Aggrastat 8 hours 2 hrs  Warfarin Coumadin 5 days 2 hrs   Other medications with blood-thinning effects  Product indications Generic (Brand) names Note  Cholesterol Lipitor Stop 4 days before procedure  Blood thinner (injectable) Heparin (LMW or LMWH Heparin) Stop 24 hours before procedure  Cancer Ibrutinib (Imbruvica) Stop 7 days before procedure  Malaria/Rheumatoid Hydroxychloroquine (Plaquenil) Stop 11 days before procedure  Thrombolytics  10 days before or after procedures   Over-the-counter (OTC) Products with blood-thinning effects  Product Common names Stop Time  Aspirin > 325 mg Goody Powders, Excedrin, etc. 11 days  Aspirin ? 81 mg  7 days  Fish oil  4 days  Garlic supplements  7 days  Ginkgo biloba  36  hours  Ginseng  24 hours  NSAIDs Ibuprofen, Naprosyn, etc. 3 days  Vitamin E  4 days   ____________________________________________________________________________________________  ______________________________________________________________________  Preparing for Procedure with Sedation  NOTICE: Due to recent regulatory changes, starting on May 17, 2021, procedures requiring intravenous (IV) sedation will no longer be performed at the [facility].  These types of procedures are required to be performed at [facility] ambulatory surgery facility.  We are very sorry for the inconvenience.  Procedure appointments are limited to planned procedures: No Prescription Refills. No disability issues will be discussed. No medication changes will be discussed.  Instructions: Oral Intake: Do not eat or drink anything for at least 8 hours prior to your procedure. (Exception: Blood Pressure Medication. See below.) Transportation: A driver is required. You may not drive yourself after the procedure. Blood Pressure Medicine: Do not forget to take your blood pressure medicine with a sip of water the morning of the procedure. If your Diastolic (lower reading) is above 100 mmHg, elective cases will be cancelled/rescheduled. Blood thinners: These will need to be stopped for procedures. Notify our staff if you are taking any blood thinners. Depending on which one you take, there will be specific instructions on how and when to stop it. Diabetics on insulin: Notify the staff so that you can be scheduled 1st case in the morning. If your diabetes requires high dose insulin, take only  of your normal insulin dose the morning of the procedure and notify the staff that you have done so. Preventing infections: Shower with an antibacterial soap the morning of your  procedure. Build-up your immune system: Take 1000 mg of Vitamin C with every meal (3 times a day) the day prior to your procedure. Antibiotics: Inform  the staff if you have a condition or reason that requires you to take antibiotics before dental procedures. Pregnancy: If you are pregnant, call and cancel the procedure. Sickness: If you have a cold, fever, or any active infections, call and cancel the procedure. Arrival: You must be in the facility at least 30 minutes prior to your scheduled procedure. Children: Do not bring children with you. Dress appropriately: There is always the possibility that your clothing may get soiled. Valuables: Do not bring any jewelry or valuables.  Reasons to call and reschedule or cancel your procedure: (Following these recommendations will minimize the risk of a serious complication.) Surgeries: Avoid having procedures within 2 weeks of any surgery. (Avoid for 2 weeks before or after any surgery). Flu Shots: Avoid having procedures within 2 weeks of a flu shots. (Avoid for 2 weeks before or after immunizations). Barium: Avoid having a procedure within 7-10 days after having had a radiological study involving the use of radiological contrast. (Myelograms, Barium swallow or enema study). Heart attacks: Avoid any elective procedures or surgeries for the initial 6 months after a "Myocardial Infarction" (Heart Attack). Blood thinners: It is imperative that you stop these medications before procedures. Let us know if you if you take any blood thinner.  Infection: Avoid procedures during or within two weeks of an infection (including chest colds or gastrointestinal problems). Symptoms associated with infections include: Localized redness, fever, chills, night sweats or profuse sweating, burning sensation when voiding, cough, congestion, stuffiness, runny nose, sore throat, diarrhea, nausea, vomiting, cold or Flu symptoms, recent or current infections. It is specially important if the infection is over the area that we intend to treat. Heart and lung problems: Symptoms that may suggest an active cardiopulmonary problem  include: cough, chest pain, breathing difficulties or shortness of breath, dizziness, ankle swelling, uncontrolled high or unusually low blood pressure, and/or palpitations. If you are experiencing any of these symptoms, cancel your procedure and contact your primary care physician for an evaluation.  Remember:  Regular Business hours are:  Monday to Thursday 8:00 AM to 4:00 PM  Provider's Schedule: Milinda Pointer, MD:  Procedure days: Tuesday and Thursday 7:30 AM to 4:00 PM  Gillis Santa, MD:  Procedure days: Monday and Wednesday 7:30 AM to 4:00 PM ______________________________________________________________________  Facet Joint Block The facet joints connect the bones of the spine (vertebrae). They make it possible for you to bend, twist, and make other movements with your spine. They also keep you from bending too far, twisting too far, and making other extreme movements. A facet joint block is a procedure in which a numbing medicine (anesthetic) is injected into a facet joint. In many cases, an anti-inflammatory medicine (steroid) is also injected. A facet joint block may be done: To diagnose neck or back pain. If the pain gets better after a facet joint block, it means the pain is probably coming from the facet joint. If the pain does not get better, it means the pain is probably not coming from the facet joint. To relieve neck or back pain that is caused by an inflamed facet joint. A facet joint block is only done to relieve pain if the pain does not improve with other methods, such as medicine, exercise programs, and physical therapy. Tell a health care provider about: Any allergies you have. All medicines you are  taking, including vitamins, herbs, eye drops, creams, and over-the-counter medicines. Any problems you or family members have had with anesthetic medicines. Any blood disorders you have. Any surgeries you have had. Any medical conditions you have or have had. Whether  you are pregnant or may be pregnant. What are the risks? Generally, this is a safe procedure. However, problems may occur, including: Bleeding. Injury to a nerve near the injection site. Pain at the injection site. Weakness or numbness in areas controlled by nerves near the injection site. Infection. Temporary fluid retention. Allergic reactions to medicines or dyes. Injury to other structures or organs near the injection site. What happens before the procedure? Medicines Ask your health care provider about: Changing or stopping your regular medicines. This is especially important if you are taking diabetes medicines or blood thinners. Taking medicines such as aspirin and ibuprofen. These medicines can thin your blood. Do not take these medicines unless your health care provider tells you to take them. Taking over-the-counter medicines, vitamins, herbs, and supplements. Eating and drinking Follow instructions from your health care provider about eating and drinking, which may include: 8 hours before the procedure - stop eating heavy meals or foods, such as meat, fried foods, or fatty foods. 6 hours before the procedure - stop eating light meals or foods, such as toast or cereal. 6 hours before the procedure - stop drinking milk or drinks that contain milk. 2 hours before the procedure - stop drinking clear liquids. Staying hydrated Follow instructions from your health care provider about hydration, which may include: Up to 2 hours before the procedure - you may continue to drink clear liquids, such as water, clear fruit juice, black coffee, and plain tea. General instructions Do not use any products that contain nicotine or tobacco for at least 4-6 weeks before the procedure. These products include cigarettes, e-cigarettes, and chewing tobacco. If you need help quitting, ask your health care provider. Plan to have someone take you home from the hospital or clinic. Ask your health care  provider: How your surgery site will be marked. What steps will be taken to help prevent infection. These may include: Removing hair at the surgery site. Washing skin with a germ-killing soap. Receiving antibiotic medicine. What happens during the procedure?  You will put on a hospital gown. You will lie on your stomach on an X-ray table. You may be asked to lie in a different position if an injection will be made in your neck. Machines will be used to monitor your oxygen levels, heart rate, and blood pressure. Your skin will be cleaned. If an injection will be made in your neck, an IV will be inserted into one of your veins. Fluids and medicine will flow directly into your body through the IV. A numbing medicine (local anesthetic) will be applied to your skin. Your skin may sting or burn for a moment. A video X-ray machine (fluoroscopy) will be used to find the joint. In some cases, a CT scan may be used. A contrast dye may be injected into the facet joint area to help find the joint. When the joint is located, an anesthetic will be injected into the joint through the needle. Your health care provider will ask you whether you feel pain relief. If you feel relief, a steroid may be injected to provide pain relief for a longer period of time. If you do not feel relief or feel only partial relief, additional injections of an anesthetic may be made in other  facet joints. The needle will be removed. Your skin will be cleaned. A bandage (dressing) will be applied over each injection site. The procedure may vary among health care providers and hospitals. What happens after the procedure? Your blood pressure, heart rate, breathing rate, and blood oxygen level will be monitored until you leave the hospital or clinic. You will lie down and rest for a period of time. Summary A facet joint block is a procedure in which a numbing medicine (anesthetic) is injected into a facet joint. An  anti-inflammatory medicine (steroid) may also be injected. Follow instructions from your health care provider about medicines and eating and drinking before the procedure. Do not use any products that contain nicotine or tobacco for at least 4-6 weeks before the procedure. You will lie on your stomach for the procedure, but you may be asked to lie in a different position if an injection will be made in your neck. When the joint is located, an anesthetic will be injected into the joint through the needle. This information is not intended to replace advice given to you by your health care provider. Make sure you discuss any questions you have with your health care provider. Document Revised: 02/02/2022 Document Reviewed: 08/02/2021 Elsevier Patient Education  [place].

## 2022-08-03 NOTE — Progress Notes (Signed)
Nursing Pain Medication Assessment:  Safety precautions to be maintained throughout the outpatient stay will include: orient to surroundings, keep bed in low position, maintain call bell within reach at all times, provide assistance with transfer out of bed and ambulation.  Medication Inspection Compliance: Pill count conducted under aseptic conditions, in front of the patient. Neither the pills nor the bottle was removed from the patient's sight at any time. Once count was completed pills were immediately returned to the patient in their original bottle.  Medication: Hydrocodone/APAP Pill/Patch Count:  43 of 90 pills remain Pill/Patch Appearance: Markings consistent with prescribed medication Bottle Appearance: Standard pharmacy container. Clearly labeled. Filled Date: 09 / 14 / 2023 Last Medication intake:  Today

## 2022-08-05 DIAGNOSIS — E876 Hypokalemia: Secondary | ICD-10-CM | POA: Diagnosis not present

## 2022-08-09 DIAGNOSIS — R0609 Other forms of dyspnea: Secondary | ICD-10-CM | POA: Diagnosis not present

## 2022-08-09 DIAGNOSIS — Z9981 Dependence on supplemental oxygen: Secondary | ICD-10-CM | POA: Diagnosis not present

## 2022-08-09 DIAGNOSIS — R0600 Dyspnea, unspecified: Secondary | ICD-10-CM | POA: Diagnosis not present

## 2022-08-09 DIAGNOSIS — R0602 Shortness of breath: Secondary | ICD-10-CM | POA: Insufficient documentation

## 2022-08-09 DIAGNOSIS — Z79899 Other long term (current) drug therapy: Secondary | ICD-10-CM | POA: Diagnosis not present

## 2022-08-09 DIAGNOSIS — R0789 Other chest pain: Secondary | ICD-10-CM | POA: Diagnosis not present

## 2022-08-09 DIAGNOSIS — R079 Chest pain, unspecified: Secondary | ICD-10-CM | POA: Diagnosis not present

## 2022-08-09 DIAGNOSIS — R531 Weakness: Secondary | ICD-10-CM | POA: Diagnosis not present

## 2022-08-09 DIAGNOSIS — J811 Chronic pulmonary edema: Secondary | ICD-10-CM | POA: Diagnosis not present

## 2022-08-09 DIAGNOSIS — Z79891 Long term (current) use of opiate analgesic: Secondary | ICD-10-CM | POA: Diagnosis not present

## 2022-08-09 DIAGNOSIS — Z5181 Encounter for therapeutic drug level monitoring: Secondary | ICD-10-CM | POA: Diagnosis not present

## 2022-08-09 DIAGNOSIS — J9601 Acute respiratory failure with hypoxia: Secondary | ICD-10-CM | POA: Diagnosis not present

## 2022-08-09 DIAGNOSIS — Z7901 Long term (current) use of anticoagulants: Secondary | ICD-10-CM | POA: Diagnosis not present

## 2022-08-09 DIAGNOSIS — Z87891 Personal history of nicotine dependence: Secondary | ICD-10-CM | POA: Diagnosis not present

## 2022-08-09 DIAGNOSIS — Z5321 Procedure and treatment not carried out due to patient leaving prior to being seen by health care provider: Secondary | ICD-10-CM | POA: Diagnosis not present

## 2022-08-09 DIAGNOSIS — J9811 Atelectasis: Secondary | ICD-10-CM | POA: Diagnosis not present

## 2022-08-09 DIAGNOSIS — J432 Centrilobular emphysema: Secondary | ICD-10-CM | POA: Diagnosis not present

## 2022-08-09 DIAGNOSIS — J441 Chronic obstructive pulmonary disease with (acute) exacerbation: Secondary | ICD-10-CM | POA: Diagnosis not present

## 2022-08-10 DIAGNOSIS — Z87891 Personal history of nicotine dependence: Secondary | ICD-10-CM | POA: Diagnosis not present

## 2022-08-10 DIAGNOSIS — J9610 Chronic respiratory failure, unspecified whether with hypoxia or hypercapnia: Secondary | ICD-10-CM | POA: Diagnosis not present

## 2022-08-10 DIAGNOSIS — R4182 Altered mental status, unspecified: Secondary | ICD-10-CM | POA: Diagnosis not present

## 2022-08-10 DIAGNOSIS — I4891 Unspecified atrial fibrillation: Secondary | ICD-10-CM | POA: Diagnosis not present

## 2022-08-10 DIAGNOSIS — G8929 Other chronic pain: Secondary | ICD-10-CM | POA: Diagnosis not present

## 2022-08-10 DIAGNOSIS — G43909 Migraine, unspecified, not intractable, without status migrainosus: Secondary | ICD-10-CM | POA: Diagnosis not present

## 2022-08-10 DIAGNOSIS — J439 Emphysema, unspecified: Secondary | ICD-10-CM | POA: Diagnosis not present

## 2022-08-10 DIAGNOSIS — M7989 Other specified soft tissue disorders: Secondary | ICD-10-CM | POA: Diagnosis not present

## 2022-08-10 DIAGNOSIS — R0602 Shortness of breath: Secondary | ICD-10-CM | POA: Diagnosis not present

## 2022-08-10 DIAGNOSIS — E669 Obesity, unspecified: Secondary | ICD-10-CM | POA: Diagnosis not present

## 2022-08-10 DIAGNOSIS — T8172XA Complication of vein following a procedure, not elsewhere classified, initial encounter: Secondary | ICD-10-CM | POA: Diagnosis not present

## 2022-08-10 DIAGNOSIS — J449 Chronic obstructive pulmonary disease, unspecified: Secondary | ICD-10-CM | POA: Diagnosis not present

## 2022-08-10 DIAGNOSIS — M79661 Pain in right lower leg: Secondary | ICD-10-CM | POA: Diagnosis not present

## 2022-08-10 DIAGNOSIS — G44201 Tension-type headache, unspecified, intractable: Secondary | ICD-10-CM | POA: Diagnosis not present

## 2022-08-10 DIAGNOSIS — I82611 Acute embolism and thrombosis of superficial veins of right upper extremity: Secondary | ICD-10-CM | POA: Diagnosis not present

## 2022-08-10 DIAGNOSIS — D649 Anemia, unspecified: Secondary | ICD-10-CM | POA: Diagnosis not present

## 2022-08-10 DIAGNOSIS — I5033 Acute on chronic diastolic (congestive) heart failure: Secondary | ICD-10-CM | POA: Diagnosis not present

## 2022-08-10 DIAGNOSIS — I2721 Secondary pulmonary arterial hypertension: Secondary | ICD-10-CM | POA: Diagnosis not present

## 2022-08-10 DIAGNOSIS — J432 Centrilobular emphysema: Secondary | ICD-10-CM | POA: Diagnosis not present

## 2022-08-10 DIAGNOSIS — K269 Duodenal ulcer, unspecified as acute or chronic, without hemorrhage or perforation: Secondary | ICD-10-CM | POA: Diagnosis not present

## 2022-08-10 DIAGNOSIS — J811 Chronic pulmonary edema: Secondary | ICD-10-CM | POA: Diagnosis not present

## 2022-08-10 DIAGNOSIS — Z6841 Body Mass Index (BMI) 40.0 and over, adult: Secondary | ICD-10-CM | POA: Diagnosis not present

## 2022-08-10 DIAGNOSIS — E039 Hypothyroidism, unspecified: Secondary | ICD-10-CM | POA: Diagnosis not present

## 2022-08-10 DIAGNOSIS — J441 Chronic obstructive pulmonary disease with (acute) exacerbation: Secondary | ICD-10-CM | POA: Diagnosis not present

## 2022-08-10 DIAGNOSIS — N200 Calculus of kidney: Secondary | ICD-10-CM | POA: Diagnosis not present

## 2022-08-10 DIAGNOSIS — R0789 Other chest pain: Secondary | ICD-10-CM | POA: Diagnosis not present

## 2022-08-10 DIAGNOSIS — F419 Anxiety disorder, unspecified: Secondary | ICD-10-CM | POA: Diagnosis not present

## 2022-08-10 DIAGNOSIS — K2970 Gastritis, unspecified, without bleeding: Secondary | ICD-10-CM | POA: Diagnosis not present

## 2022-08-10 DIAGNOSIS — J9811 Atelectasis: Secondary | ICD-10-CM | POA: Diagnosis not present

## 2022-08-10 DIAGNOSIS — K921 Melena: Secondary | ICD-10-CM | POA: Diagnosis not present

## 2022-08-10 DIAGNOSIS — I110 Hypertensive heart disease with heart failure: Secondary | ICD-10-CM | POA: Diagnosis not present

## 2022-08-10 DIAGNOSIS — I5032 Chronic diastolic (congestive) heart failure: Secondary | ICD-10-CM | POA: Diagnosis not present

## 2022-08-10 DIAGNOSIS — J9611 Chronic respiratory failure with hypoxia: Secondary | ICD-10-CM | POA: Diagnosis not present

## 2022-08-10 DIAGNOSIS — K219 Gastro-esophageal reflux disease without esophagitis: Secondary | ICD-10-CM | POA: Diagnosis not present

## 2022-08-10 DIAGNOSIS — I071 Rheumatic tricuspid insufficiency: Secondary | ICD-10-CM | POA: Diagnosis not present

## 2022-08-10 DIAGNOSIS — J9621 Acute and chronic respiratory failure with hypoxia: Secondary | ICD-10-CM | POA: Diagnosis not present

## 2022-08-10 DIAGNOSIS — E785 Hyperlipidemia, unspecified: Secondary | ICD-10-CM | POA: Diagnosis not present

## 2022-08-10 DIAGNOSIS — B974 Respiratory syncytial virus as the cause of diseases classified elsewhere: Secondary | ICD-10-CM | POA: Diagnosis not present

## 2022-08-10 DIAGNOSIS — I700 Atherosclerosis of aorta: Secondary | ICD-10-CM | POA: Diagnosis not present

## 2022-08-10 DIAGNOSIS — F32A Depression, unspecified: Secondary | ICD-10-CM | POA: Diagnosis not present

## 2022-08-10 DIAGNOSIS — I272 Pulmonary hypertension, unspecified: Secondary | ICD-10-CM | POA: Diagnosis not present

## 2022-08-10 DIAGNOSIS — R0609 Other forms of dyspnea: Secondary | ICD-10-CM | POA: Diagnosis not present

## 2022-08-10 DIAGNOSIS — Y848 Other medical procedures as the cause of abnormal reaction of the patient, or of later complication, without mention of misadventure at the time of the procedure: Secondary | ICD-10-CM | POA: Diagnosis not present

## 2022-08-11 ENCOUNTER — Ambulatory Visit: Payer: Medicare HMO | Admitting: Pain Medicine

## 2022-08-26 DIAGNOSIS — J441 Chronic obstructive pulmonary disease with (acute) exacerbation: Secondary | ICD-10-CM | POA: Diagnosis not present

## 2022-08-26 DIAGNOSIS — Z8719 Personal history of other diseases of the digestive system: Secondary | ICD-10-CM | POA: Insufficient documentation

## 2022-08-26 DIAGNOSIS — I5032 Chronic diastolic (congestive) heart failure: Secondary | ICD-10-CM | POA: Diagnosis not present

## 2022-08-26 DIAGNOSIS — R0602 Shortness of breath: Secondary | ICD-10-CM | POA: Diagnosis not present

## 2022-08-28 DIAGNOSIS — D649 Anemia, unspecified: Secondary | ICD-10-CM | POA: Insufficient documentation

## 2022-08-28 NOTE — Progress Notes (Unsigned)
Patient ID: Theresa [name], female    DOB: 1956/09/28, 66 y.o.   MRN: [id number]  HPI Theresa [name] is a 66 y/o female with a history of chronic low back pain, HTN, depression, COPD on long-term oxygen, anxiety, carpal tunnel syndrome, long-standing tobacco use and chronic heart failure.  Echo report from 04/29/22 reviewed and showed an EF of 60-65% along with mild MR and no thrombus. Echo report from 08/08/21 reviewed and showed an EF of 60-65% along with mild LAE. Echo report from 09/03/20 reviewed and showed an EF of >55% with mild LAE and severe TR. Echo report from 07/07/2019 reviewed and showed an EF of 60-65% along with mild TR and moderately elevated PA pressure. Echo done 11/03/15 but unable to access results. Echo done on 02/19/15 showed an EF of 50% without valvular stenosis.   Catheterization done 07/11/2019 showed:  Prox RCA to Dist RCA lesion is 50% stenosed. Mid LAD lesion is 50% stenosed. Ost LM to Dist LM lesion is 10% stenosed. Hemodynamic findings consistent with moderate pulmonary hypertension. Normal overall left ventricular function of at least 60% Moderate coronary disease no significant obstructive disease Moderate pulmonary hypertension  Admitted 08/09/22 due to cough and worsening SOB due to COPD exacerbation due to RSV. Hypotension due to overdiuresis. Blood transfusion given due to hemoglobin <7 due to slow GIB. Initially need increased oxygen at 8L but able to be weaned down to baseline 4L. [place] negative for DVT. Discharged after 17 days. Admitted 07/18/22 due to abdominal pain and nausea. Cardiology consult obtained. Troponin elevation thought to be due to demand ischemia.    Discharged after 3 days.   She presents today for an acute visit with a chief complaint of   Past Medical History:  Diagnosis Date   Acute postoperative pain 10/03/2017   Anxiety    BiPAP (biphasic positive airway pressure) dependence    at hs   Carpal tunnel syndrome    CHF (congestive heart  failure) ([facility])    1/18   CHF (congestive heart failure) ([facility])    Chronic generalized abdominal pain    Chronic rhinitis    COPD (chronic obstructive pulmonary disease) ([facility])    DDD (degenerative disc disease), cervical    DDD (degenerative disc disease), lumbosacral    Depression    Dyspnea    Edema    Flu    1/18   Gastritis    GERD (gastroesophageal reflux disease)    Hematuria    Hernia of abdominal wall 07/17/2018   Hernia, abdominal    Hypertension    Kidney stones    Low back pain    Lumbar radiculitis    Malodorous urine    Muscle weakness    Obesity    Oxygen dependent    Pneumonia due to COVID-19 virus 02/03/2020   Renal cyst    Sensory urge incontinence    Thyroid activity decreased    9/19   Tobacco abuse    Wheezing    Past Surgical History:  Procedure Laterality Date   ABDOMINAL HYSTERECTOMY     CARDIOVERSION N/A 04/29/2022   Procedure: CARDIOVERSION;  Surgeon: Corey Skains, MD;  Location: [facility] OR[facility];  Service: Cardiovascular;  Laterality: N/A;   CARPAL TUNNEL RELEASE Left 2012   EPIGASTRIC HERNIA REPAIR N/A 08/13/2018   HERNIA REPAIR EPIGASTRIC ADULT; polypropylene mesh reinforcement.  Surgeon: Robert Bellow, MD;  Location: [facility];  Service: General;  Laterality: N/A;   ESOPHAGOGASTRODUODENOSCOPY N/A 06/21/2022   Procedure: ESOPHAGOGASTRODUODENOSCOPY (EGD);  Surgeon: Toledo, Benay Pike, MD;  Location: [facility];  Service: Gastroenterology;  Laterality: N/A;   RIGHT/LEFT HEART CATH AND CORONARY ANGIOGRAPHY N/A 07/11/2019   Procedure: RIGHT/LEFT HEART CATH AND CORONARY ANGIOGRAPHY;  Surgeon: Yolonda Kida, MD;  Location: [facility];  Service: Cardiovascular;  Laterality: N/A;   TEE WITHOUT CARDIOVERSION N/A 04/29/2022   Procedure: TRANSESOPHAGEAL ECHOCARDIOGRAM (TEE);  Surgeon: Corey Skains, MD;  Location: [facility];  Service: Cardiovascular;  Laterality: N/A;   TONSILLECTOMY     TUBAL LIGATION     UMBILICAL HERNIA REPAIR N/A  08/13/2018   HERNIA REPAIR UMBILICAL ADULT; polypropylene mesh reinforcement Surgeon: Robert Bellow, MD;  Location: [facility] OR[facility];  Service: General;  Laterality: N/A;   Family History  Problem Relation Age of Onset   Heart disease Mother    Stroke Mother    Coronary artery disease Mother    Lung cancer Sister    Cancer Sister    Breast cancer Sister    Alcohol abuse Father    Heart disease Father    Cancer Brother    Cancer Brother    Pneumonia Brother    Prostate cancer Neg Hx    Kidney cancer Neg Hx    Bladder Cancer Neg Hx    Social History   Tobacco Use   Smoking status: Former    Years: 44.00    Types: Cigarettes   Smokeless tobacco: Never   Tobacco comments:    over a half pack  Substance Use Topics   Alcohol use: No   Allergies  Allergen Reactions   Gabapentin Other (See Comments)    Higher doses of 800 mg, 600 mg, 300 mg causes excessive sedation and contributes to numerous mechanical falls. (Unsure if 100 mg dose can be tolerated)   Codeine Nausea And Vomiting   Meloxicam Other (See Comments)    Stomach pain     Review of Systems  Constitutional:  Positive for fatigue. Negative for appetite change.  Eyes: Negative.   Respiratory:  Positive for cough, shortness of breath and wheezing (when walking).   Cardiovascular:  Negative for chest pain, palpitations and leg swelling.  Gastrointestinal:  Negative for abdominal distention, abdominal pain and nausea.  Endocrine: Negative.   Genitourinary: Negative.   Musculoskeletal:  Positive for back pain.  Skin: Negative.   Allergic/Immunologic: Negative.   Neurological:  Positive for headaches. Negative for dizziness and light-headedness.  Hematological:  Negative for adenopathy. Does not bruise/bleed easily.  Psychiatric/Behavioral:  Positive for sleep disturbance (wearing oxygen & bipap at night). Negative for agitation, dysphoric mood and suicidal ideas. The patient is nervous/anxious.         Physical  Exam Vitals and nursing note reviewed.  Constitutional:      Appearance: She is well-developed.  HENT:     Head: Normocephalic and atraumatic.  Neck:     Vascular: No JVD.  Cardiovascular:     Rate and Rhythm: Normal rate and regular rhythm.  Pulmonary:     Effort: Pulmonary effort is normal.     Breath sounds: No wheezing, rhonchi or rales.  Abdominal:     General: There is no distension.     Palpations: Abdomen is soft.     Tenderness: There is no abdominal tenderness.  Musculoskeletal:        General: No tenderness.     Cervical back: Normal range of motion and neck supple.     Right lower leg: No edema.     Left lower  leg: No edema.  Skin:    General: Skin is warm and dry.  Neurological:     General: No focal deficit present.     Mental Status: She is alert and oriented to person, place, and time.  Psychiatric:        Mood and Affect: Mood normal. Mood is not depressed.        Behavior: Behavior normal.        Thought Content: Thought content normal.      Assessment & Plan:  1: Chronic heart failure with preserved ejection fraction without structural changes- - NYHA class III - euvolemic today - continues to weigh daily at home. Reminded to call for an overnight weight gain of >2 pounds or a weekly weight gain of >5 pounds - weight 197.4 pounds from previous visit here 3 months ago - BNP 06/19/22 was 51.6  2: HTN- - BP  - saw PCP Rollene Rotunda) 05/05/22 - BMP from 08/26/22 reviewed and showed sodium 140, potassium 3.4, creatinine 0.8 and GFR 82  3: COPD- - wearing bipap nightly - oxygen at 2.5-3L mostly around the clock right now - last saw pulmonology Raul Del) 07/27/22  4: Tobacco use- - smoking 1 ppd - removes herself from the oxygen when smoking  5: Atrial flutter- - cardioverted during recent admission - saw cardiologist Clayborn Bigness) 05/20/22    Patient brought medications each medication was verbally reviewed with the patient and she was encouraged to bring  the bottles to every visit to confirm accuracy of list.

## 2022-08-29 ENCOUNTER — Encounter: Payer: Self-pay | Admitting: Family

## 2022-08-29 ENCOUNTER — Ambulatory Visit: Payer: Medicare HMO | Attending: Family | Admitting: Family

## 2022-08-29 ENCOUNTER — Inpatient Hospital Stay: Payer: Medicare HMO | Admitting: Family Medicine

## 2022-08-29 VITALS — BP 96/48 | HR 73 | Resp 18 | Wt 220.0 lb

## 2022-08-29 DIAGNOSIS — Z7902 Long term (current) use of antithrombotics/antiplatelets: Secondary | ICD-10-CM | POA: Diagnosis not present

## 2022-08-29 DIAGNOSIS — R69 Illness, unspecified: Secondary | ICD-10-CM | POA: Diagnosis not present

## 2022-08-29 DIAGNOSIS — J441 Chronic obstructive pulmonary disease with (acute) exacerbation: Secondary | ICD-10-CM | POA: Insufficient documentation

## 2022-08-29 DIAGNOSIS — F419 Anxiety disorder, unspecified: Secondary | ICD-10-CM | POA: Diagnosis not present

## 2022-08-29 DIAGNOSIS — F17210 Nicotine dependence, cigarettes, uncomplicated: Secondary | ICD-10-CM | POA: Diagnosis not present

## 2022-08-29 DIAGNOSIS — Z720 Tobacco use: Secondary | ICD-10-CM

## 2022-08-29 DIAGNOSIS — I10 Essential (primary) hypertension: Secondary | ICD-10-CM

## 2022-08-29 DIAGNOSIS — I110 Hypertensive heart disease with heart failure: Secondary | ICD-10-CM | POA: Diagnosis not present

## 2022-08-29 DIAGNOSIS — J449 Chronic obstructive pulmonary disease, unspecified: Secondary | ICD-10-CM

## 2022-08-29 DIAGNOSIS — G8929 Other chronic pain: Secondary | ICD-10-CM | POA: Diagnosis not present

## 2022-08-29 DIAGNOSIS — Z79899 Other long term (current) drug therapy: Secondary | ICD-10-CM | POA: Diagnosis not present

## 2022-08-29 DIAGNOSIS — I4892 Unspecified atrial flutter: Secondary | ICD-10-CM | POA: Insufficient documentation

## 2022-08-29 DIAGNOSIS — I5033 Acute on chronic diastolic (congestive) heart failure: Secondary | ICD-10-CM | POA: Insufficient documentation

## 2022-08-29 DIAGNOSIS — Z9981 Dependence on supplemental oxygen: Secondary | ICD-10-CM | POA: Insufficient documentation

## 2022-08-29 NOTE — Patient Instructions (Addendum)
Continue weighing daily and call for an overnight weight gain of 3 pounds or more or a weekly weight gain of more than 5 pounds.   If you have voicemail, please make sure your mailbox is cleaned out so that we may leave a message and please make sure to listen to any voicemails.     Today's Reds vest reading was 47% (normal is <35%)  Your provider has order Furoscix for you. This is an on-body infuser that gives you a dose of Furosemide.   It will be shipped to your home   [employer] will call you to discuss before shipping so, PLEASE answer unknown calls  For questions regarding the device call [employer] at [phone]  Ensure you write down the time you start your infusion so that if there is a problem you will know how long the infusion lasted  Use Furoscix only AS DIRECTED by our office  Dosing Directions:   Day 1= Use furoscix tomorrow and take an additional 2 potassium tablets. Do not take your torsemide tomorrow

## 2022-08-29 NOTE — Progress Notes (Signed)
ReDS Vest / Clip - 08/29/22 1200       ReDS Vest / Clip   Station Marker A    Ruler Value 31    ReDS Actual Value 47

## 2022-08-30 ENCOUNTER — Other Ambulatory Visit: Payer: Self-pay | Admitting: Family Medicine

## 2022-08-30 DIAGNOSIS — F419 Anxiety disorder, unspecified: Secondary | ICD-10-CM

## 2022-08-30 NOTE — Progress Notes (Unsigned)
Patient ID: Theresa [name], female    DOB: [birth year], 66 y.o.   MRN: [id number]  HPI  Theresa [name] is a 66 y/o female with a history of chronic low back pain, HTN, depression, COPD on long-term oxygen, anxiety, carpal tunnel syndrome, long-standing tobacco use and chronic heart failure.  Echo report from 04/29/22 reviewed and showed an EF of 60-65% along with mild MR and no thrombus. Echo report from 08/08/21 reviewed and showed an EF of 60-65% along with mild LAE. Echo report from 09/03/20 reviewed and showed an EF of >55% with mild LAE and severe TR. Echo report from 07/07/2019 reviewed and showed an EF of 60-65% along with mild TR and moderately elevated PA pressure. Echo done 11/03/15 but unable to access results. Echo done on 02/19/15 showed an EF of 50% without valvular stenosis.   Catheterization done 07/11/2019 showed:  Prox RCA to Dist RCA lesion is 50% stenosed. Mid LAD lesion is 50% stenosed. Ost LM to Dist LM lesion is 10% stenosed. Hemodynamic findings consistent with moderate pulmonary hypertension. Normal overall left ventricular function of at least 60% Moderate coronary disease no significant obstructive disease Moderate pulmonary hypertension  Admitted 08/09/22 due to cough and worsening SOB due to COPD exacerbation due to RSV. Hypotension due to overdiuresis. Blood transfusion given due to hemoglobin <7 due to slow GIB. Initially need increased oxygen at 8L but able to be weaned down to baseline 4L. [place] negative for DVT. Discharged after 17 days. Admitted 07/18/22 due to abdominal pain and nausea. Cardiology consult obtained. Troponin elevation thought to be due to demand ischemia. Discharged after 3 days.   Theresa [name] presents today for a follow-up visit with a chief complaint of moderate shortness of breath with minimal exertion. Describes this as chronic in nature. Has associated fatigue, cough, pedal edema, wheezing, abdominal distention, headaches, light-headedness, anxiety and difficulty  sleeping. Theresa [name] denies any palpitations, chest pain or weight gain.   Took 1 dose of furoscix yesterday with additional 61mq potassium. Weight is down 2 pounds and ReDs clip has also improved slightly.   Her chronic oxygen requirement continues to be 6L at rest, 8L with exertion and 10L when in the shower.   Theresa [name] does feel like Theresa [name] may have a stye coming on her right eye. Has just started using warm compress on her eye.   Past Medical History:  Diagnosis Date   Acute postoperative pain 10/03/2017   Anxiety    BiPAP (biphasic positive airway pressure) dependence    at hs   Carpal tunnel syndrome    CHF (congestive heart failure) ([facility]    1/18   CHF (congestive heart failure) (HCC)    Chronic generalized abdominal pain    Chronic rhinitis    COPD (chronic obstructive pulmonary disease) (HCC)    DDD (degenerative disc disease), cervical    DDD (degenerative disc disease), lumbosacral    Depression    Dyspnea    Edema    Flu    1/18   Gastritis    GERD (gastroesophageal reflux disease)    Hematuria    Hernia of abdominal wall 07/17/2018   Hernia, abdominal    Hypertension    Kidney stones    Low back pain    Lumbar radiculitis    Malodorous urine    Muscle weakness    Obesity    Oxygen dependent    Pneumonia due to COVID-19 virus 02/03/2020   Renal cyst    Sensory urge incontinence    Thyroid  activity decreased    9/19   Tobacco abuse    Wheezing    Past Surgical History:  Procedure Laterality Date   ABDOMINAL HYSTERECTOMY     CARDIOVERSION N/A 04/29/2022   Procedure: CARDIOVERSION;  Surgeon: Corey Skains, MD;  Location: [facility] OR[facility];  Service: Cardiovascular;  Laterality: N/A;   CARPAL TUNNEL RELEASE Left 2012   EPIGASTRIC HERNIA REPAIR N/A 08/13/2018   HERNIA REPAIR EPIGASTRIC ADULT; polypropylene mesh reinforcement.  Surgeon: Robert Bellow, MD;  Location: [facility];  Service: General;  Laterality: N/A;   ESOPHAGOGASTRODUODENOSCOPY N/A 06/21/2022   Procedure:  ESOPHAGOGASTRODUODENOSCOPY (EGD);  Surgeon: Toledo, Benay Pike, MD;  Location: [facility];  Service: Gastroenterology;  Laterality: N/A;   RIGHT/LEFT HEART CATH AND CORONARY ANGIOGRAPHY N/A 07/11/2019   Procedure: RIGHT/LEFT HEART CATH AND CORONARY ANGIOGRAPHY;  Surgeon: Yolonda Kida, MD;  Location: [place] [facility];  Service: Cardiovascular;  Laterality: N/A;   TEE WITHOUT CARDIOVERSION N/A 04/29/2022   Procedure: TRANSESOPHAGEAL ECHOCARDIOGRAM (TEE);  Surgeon: Corey Skains, MD;  Location: [facility];  Service: Cardiovascular;  Laterality: N/A;   TONSILLECTOMY     TUBAL LIGATION     UMBILICAL HERNIA REPAIR N/A 08/13/2018   HERNIA REPAIR UMBILICAL ADULT; polypropylene mesh reinforcement Surgeon: Robert Bellow, MD;  Location: [facility];  Service: General;  Laterality: N/A;   Family History  Problem Relation Age of Onset   Heart disease Mother    Stroke Mother    Coronary artery disease Mother    Lung cancer Sister    Cancer Sister    Breast cancer Sister    Alcohol abuse Father    Heart disease Father    Cancer Brother    Cancer Brother    Pneumonia Brother    Prostate cancer Neg Hx    Kidney cancer Neg Hx    Bladder Cancer Neg Hx    Social History   Tobacco Use   Smoking status: Former    Years: 44.00    Types: Cigarettes   Smokeless tobacco: Never   Tobacco comments:    over a half pack  Substance Use Topics   Alcohol use: No   Allergies  Allergen Reactions   Gabapentin Other (See Comments)    Higher doses of 800 mg, 600 mg, 300 mg causes excessive sedation and contributes to numerous mechanical falls. (Unsure if 100 mg dose can be tolerated)   Codeine Nausea And Vomiting   Meloxicam Other (See Comments)    Stomach pain   Prior to Admission medications   Medication Sig Start Date End Date Taking? Authorizing Provider  acetaminophen (TYLENOL) 500 MG tablet Take 1,000 mg by mouth every 8 (eight) hours as needed.   Yes [provider]   albuterol (VENTOLIN HFA) 108 (90 Base) MCG/ACT inhaler Inhale 2 puffs into the lungs every 6 (six) hours as needed for wheezing or shortness of breath. 10/23/19  Yes Veronese, [place], MD  ambrisentan (LETAIRIS) 5 MG tablet Take 5 mg by mouth daily. 08/24/22 08/24/23 Yes [provider]  amiodarone (PACERONE) 200 MG tablet Take 1 tablet (200 mg total) by mouth daily. 04/30/22  Yes Sharen Hones, MD  busPIRone (BUSPAR) 5 MG tablet Take 1 tablet (5 mg total) by mouth 2 (two) times daily. 05/05/22  Yes Tally Joe T, FNP  butalbital-acetaminophen-caffeine (FIORICET) [phone] MG tablet Take 1 tablet by mouth every 6 (six) hours as needed for headache. LIMIT USE TO PREVENT MEDICATION OVERUSE HEADACHE 04/09/22  Yes Emeterio Reeve, DO  Cholecalciferol  50 MCG (2000 UT) CAPS Take 2,000 Units by mouth daily.    Yes [provider]  diclofenac Sodium (VOLTAREN) 1 % GEL Apply topically. Apply to affected area up to 4 times per day (shoulder and lowe back) 07/10/22 07/10/23 Yes [provider]  diltiazem (CARDIZEM CD) 180 MG 24 hr capsule Take 180 mg by mouth daily. 05/20/22  Yes [provider]  Fluticasone-Umeclidin-Vilant 100-62.5-25 MCG/INH AEPB Inhale 1 puff into the lungs daily. 10/03/18  Yes [provider]  HYDROcodone-acetaminophen (NORCO/VICODIN) 5-325 MG tablet Take 1 tablet by mouth every 8 (eight) hours as needed for severe pain. Must last 30 days 08/03/22 09/02/22 Yes Milinda Pointer, MD  ipratropium-albuterol (DUONEB) 0.5-2.5 (3) MG/3ML SOLN USE 1 VIAL VIA NEBULIZER EVERY 6 HOURS AS NEEDED FOR SHORTNESS OF BREATH OR WHEEZING 11/30/20  Yes Chrismon, Vickki Muff, PA-C  OXYGEN Inhale 4 L/min into the lungs continuous. 4 L at rest 8 L with exertion  10 L shower   Yes [provider]  pantoprazole (PROTONIX) 40 MG tablet Take 1 tablet (40 mg total) by mouth daily. Patient taking differently: Take 40 mg by mouth 2 (two) times daily. 06/23/22  Yes Lorella Nimrod, MD  potassium chloride SA (KLOR-CON M) 20 MEQ tablet Take 2 tablets every morning and 1 tablet every evening 06/22/22  Yes Mischele Detter, Aura Fey, FNP  rivaroxaban (XARELTO) 20 MG TABS tablet Take 1 tablet (20 mg total) by mouth daily with supper. 04/29/22  Yes Sharen Hones, MD  roflumilast (DALIRESP) 500 MCG TABS tablet Take 500 mcg by mouth daily.   Yes [provider]  sertraline (ZOLOFT) 100 MG tablet TAKE 2 TABLETS BY MOUTH ONCE EVERY MORNING 04/12/22  Yes Birdie Sons, MD  simvastatin (ZOCOR) 20 MG tablet Take 20 mg by mouth at bedtime. 04/11/22  Yes [provider]  sodium chloride (OCEAN) 0.65 % nasal spray Place into the nose. 08/01/22  Yes [provider]  sucralfate (CARAFATE) 1 g tablet Take 1 g by mouth 4 (four) times daily. 07/21/22  Yes [provider]  torsemide (DEMADEX) 20 MG tablet Take 20 mg by mouth 2 (two) times daily. 80 mg (4 tablets) in the morning, and 40 mg (2 tablets) in the evening   Yes [provider]  HYDROcodone-acetaminophen (NORCO/VICODIN) 5-325 MG tablet Take 1 tablet by mouth every 8 (eight) hours as needed for severe pain. Must last 30 days Patient not taking: Reported on 08/29/2022 09/01/22 10/01/22  Milinda Pointer, MD  HYDROcodone-acetaminophen (NORCO/VICODIN) 5-325 MG tablet Take 1 tablet by mouth every 8 (eight) hours as needed for severe pain. Must last 30 days Patient not taking: Reported on 08/29/2022 10/01/22 10/31/22  Milinda Pointer, MD  naloxone [facility]) nasal spray 4 mg/0.1 mL Place 1 spray into the nose as needed for up to 365 doses (for opioid-induced respiratory depresssion). In case of emergency (overdose), spray once into each nostril. If no response within 3 minutes, repeat application and call [phone]. Patient not taking: Reported on 08/31/2022 08/03/22 08/03/23  Milinda Pointer, MD  tamsulosin (FLOMAX) 0.4 MG CAPS capsule Take by mouth. 07/21/22 07/21/23  [provider]  varenicline  (CHANTIX) 1 MG tablet Take by mouth. Patient not taking: Reported on 08/29/2022 07/11/22   [provider]    Review of Systems  Constitutional:  Positive for fatigue. Negative for appetite change.  Eyes: Negative.   Respiratory:  Positive for cough (up blood), shortness of breath and wheezing (when walking).   Cardiovascular:  Positive for  leg swelling. Negative for chest pain and palpitations.  Gastrointestinal:  Positive for abdominal distention. Negative for abdominal pain and nausea.  Endocrine: Negative.   Genitourinary: Negative.   Musculoskeletal:  Positive for arthralgias (right shoulder) and back pain.  Skin: Negative.   Allergic/Immunologic: Negative.   Neurological:  Positive for light-headedness and headaches. Negative for dizziness.  Hematological:  Negative for adenopathy. Bruises/bleeds easily.  Psychiatric/Behavioral:  Positive for sleep disturbance (wearing oxygen & bipap at night). Negative for agitation, dysphoric mood and suicidal ideas. The patient is nervous/anxious.    Vitals:   08/31/22 0941  BP: (!) 116/55  Pulse: 81  Resp: 16  SpO2: 93%  Weight: 218 lb 6 oz (99.1 kg)  Height: _0  (1.575 m)   Wt Readings from Last 3 Encounters:  08/31/22 218 lb 6 oz (99.1 kg)  08/29/22 220 lb (99.8 kg)  08/03/22 200 lb (90.7 kg)   Lab Results  Component Value Date   CREATININE 0.72 06/21/2022   CREATININE 0.92 06/20/2022   CREATININE 0.91 06/19/2022   Physical Exam Vitals and nursing note reviewed. Exam conducted with a chaperone present (friend).  Constitutional:      Appearance: Theresa [name] is well-developed.  HENT:     Head: Normocephalic and atraumatic.  Neck:     Vascular: No JVD.  Cardiovascular:     Rate and Rhythm: Normal rate and regular rhythm.  Pulmonary:     Effort: Pulmonary effort is normal.     Breath sounds: No wheezing, rhonchi or rales.  Abdominal:     General: There is distension.     Palpations: Abdomen is soft.     Tenderness:  There is no abdominal tenderness.  Musculoskeletal:        General: No tenderness.     Cervical back: Normal range of motion and neck supple.     Right lower leg: Edema (3+ pitting) present.     Left lower leg: Edema (2+ pitting) present.  Skin:    General: Skin is warm and dry.  Neurological:     General: No focal deficit present.     Mental Status: Theresa [name] is alert and oriented to person, place, and time.  Psychiatric:        Mood and Affect: Mood normal. Mood is not depressed.        Behavior: Behavior normal.        Thought Content: Thought content normal.   Assessment & Plan:  1: Acute on Chronic heart failure with preserved ejection fraction without structural changes- - NYHA class III - fluid overloaded today with weight gain, edema and symptoms although symptoms are slightly better - weighing daily; reminded to call for an overnight weight gain of >2 pounds or a weekly weight gain of >5 pounds - weight down 2 pounds from last visit here 2 days ago - ReDs clip reading in office 2 days was elevated at 47% - ReDs clip reading today was 45% - emphasized getting compression socks and put them on every morning with removal at bedtime - will check BMP today since Theresa [name] took 1 dose of furoscix with additional 72mq potassium - 2 more doses of furoscix provided; to use it tomorrow and then again in 3 days; to take 2 additional potassium tablets each of those days - begin farxiga 178mdaily; 30 day voucher provided - check BMP next visit as well - BNP 06/19/22 was 51.6 - PharmD reconciled medications with the patient  2: HTN- - BP looks good (116/55) -  saw PCP Rollene Rotunda) 05/05/22; getting established with Dr. Lovie Macadamia in the next few weeks - BMP from 08/26/22 reviewed and showed sodium 140, potassium 3.4, creatinine 0.8 and GFR 82; recheck this today  3: COPD- - wearing bipap nightly - oxygen at 6L at rest, 8L on exertion and 10L when showering - last saw pulmonology Raul Del)  07/27/22  4: Tobacco use- - smoking 1 ppd - removes herself from the oxygen when smoking  5: Atrial flutter- - previous cardioversion done - saw cardiologist Clayborn Bigness) 05/20/22    Medication list reviewed.   Return in 6 days, sooner if needed

## 2022-08-31 ENCOUNTER — Encounter: Payer: Self-pay | Admitting: Family

## 2022-08-31 ENCOUNTER — Other Ambulatory Visit
Admission: RE | Admit: 2022-08-31 | Discharge: 2022-08-31 | Disposition: A | Discharge status: Home / Self Care | Payer: Medicare HMO | Source: Ambulatory Visit | Attending: Family | Admitting: Family

## 2022-08-31 ENCOUNTER — Ambulatory Visit (HOSPITAL_BASED_OUTPATIENT_CLINIC_OR_DEPARTMENT_OTHER): Payer: Medicare HMO | Admitting: Family

## 2022-08-31 ENCOUNTER — Encounter: Payer: Self-pay | Admitting: Pharmacist

## 2022-08-31 VITALS — BP 116/55 | HR 81 | Resp 16 | Ht 62.0 in | Wt 218.4 lb

## 2022-08-31 DIAGNOSIS — G8929 Other chronic pain: Secondary | ICD-10-CM | POA: Insufficient documentation

## 2022-08-31 DIAGNOSIS — F419 Anxiety disorder, unspecified: Secondary | ICD-10-CM | POA: Insufficient documentation

## 2022-08-31 DIAGNOSIS — J449 Chronic obstructive pulmonary disease, unspecified: Secondary | ICD-10-CM

## 2022-08-31 DIAGNOSIS — I5033 Acute on chronic diastolic (congestive) heart failure: Secondary | ICD-10-CM | POA: Insufficient documentation

## 2022-08-31 DIAGNOSIS — I272 Pulmonary hypertension, unspecified: Secondary | ICD-10-CM | POA: Insufficient documentation

## 2022-08-31 DIAGNOSIS — I10 Essential (primary) hypertension: Secondary | ICD-10-CM | POA: Diagnosis not present

## 2022-08-31 DIAGNOSIS — J441 Chronic obstructive pulmonary disease with (acute) exacerbation: Secondary | ICD-10-CM | POA: Insufficient documentation

## 2022-08-31 DIAGNOSIS — F32A Depression, unspecified: Secondary | ICD-10-CM | POA: Insufficient documentation

## 2022-08-31 DIAGNOSIS — Z720 Tobacco use: Secondary | ICD-10-CM

## 2022-08-31 DIAGNOSIS — I4892 Unspecified atrial flutter: Secondary | ICD-10-CM

## 2022-08-31 DIAGNOSIS — Z9981 Dependence on supplemental oxygen: Secondary | ICD-10-CM | POA: Insufficient documentation

## 2022-08-31 DIAGNOSIS — Z87891 Personal history of nicotine dependence: Secondary | ICD-10-CM | POA: Insufficient documentation

## 2022-08-31 DIAGNOSIS — I110 Hypertensive heart disease with heart failure: Secondary | ICD-10-CM | POA: Insufficient documentation

## 2022-08-31 LAB — BASIC METABOLIC PANEL
Anion gap: 10 (ref 5–15)
BUN: 14 mg/dL (ref 8–23)
CO2: 30 mmol/L (ref 22–32)
Calcium: 9.5 mg/dL (ref 8.9–10.3)
Chloride: 105 mmol/L (ref 98–111)
Creatinine, Ser: 0.78 mg/dL (ref 0.44–1.00)
GFR, Estimated: >60 mL/min (ref >60)
Glucose, Bld: 99 mg/dL (ref 70–99)
Potassium: 3.4 mmol/L — ABNORMAL LOW (ref 3.5–5.1)
Sodium: 145 mmol/L (ref 135–145)

## 2022-08-31 LAB — BRAIN NATRIURETIC PEPTIDE: B Natriuretic Peptide: 36.4 pg/mL (ref 0.0–100.0)

## 2022-08-31 MED ORDER — DAPAGLIFLOZIN PROPANEDIOL 10 MG PO TABS: 10.0000 mg | ORAL_TABLET | Freq: Every day | ORAL | 5 refills | Status: DC

## 2022-08-31 NOTE — Patient Instructions (Addendum)
Continue weighing daily and call for an overnight weight gain of 3 pounds or more or a weekly weight gain of more than 5 pounds.   If you have voicemail, please make sure your mailbox is cleaned out so that we may leave a message and please make sure to listen to any voicemails.    Get compression socks and put them on every morning with removal at bedtime   Start taking farxiga as 1 tablet every day   Your provider has order Furoscix for you. This is an on-body infuser that gives you a dose of Furosemide.   It will be shipped to your home   [employer] will call you to discuss before shipping so, PLEASE answer unknown calls  For questions regarding the device call [employer] at [phone]  Ensure you write down the time you start your infusion so that if there is a problem you will know how long the infusion lasted  Use Furoscix only AS DIRECTED by our office  Dosing Directions:   Day 1= Thursday use furoscix and take 2 additional potassium tablets  Day 2= Saturday use furoscix and take 2 additional potassium tablets.

## 2022-08-31 NOTE — Progress Notes (Signed)
ReDS Vest / Clip - 08/31/22 0941       ReDS Vest / Clip   Station Marker A    Ruler Value 33    ReDS Actual Value 45

## 2022-09-01 ENCOUNTER — Ambulatory Visit: Payer: Medicare HMO | Admitting: Family

## 2022-09-01 NOTE — Progress Notes (Signed)
Patient ID: Theresa [name], female   DOB: [birth year], 66 y.o.   MRN: [id number] [place] - PHARMACIST COUNSELING NOTE  *HFpEF*  Guideline-Directed Medical Therapy/Evidence Based Medicine  ACE/ARB/ARNI: N/A soft BP and EF > 60% Beta Blocker:  none Aldosterone Antagonist:  none Diuretic: Torsemide 80 mg in AM and 65m qPM SGLT2i:  none  Adherence Assessment  Do you ever forget to take your medication? _0 Yes _1 No  Do you ever skip doses due to side effects? _2 Yes _3 No  Do you have trouble affording your medicines? _4 Yes _5 No  Are you ever unable to pick up your medication due to transportation difficulties? _6 Yes _7 No  Do you ever stop taking your medications because you don't believe they are helping? _8 Yes _9 No  Do you check your weight daily? _10 Yes _11 No   Adherence strategy: none  Barriers to obtaining medications: none  *ReDs clip 47% on 11/13 -> 45% today 11/15*  Vital signs: HR 81, BP 116/55, weight (pounds) 218 lbs ECHO: Date 04/29/22, EF 60-65%, mild MR and no thrombus       Latest Ref Rng & Units 08/31/2022   11:05 AM 06/21/2022    4:17 AM 06/20/2022    8:09 AM  BMP  Glucose 70 - 99 mg/dL 99  94  102   BUN 8 - 23 mg/dL _12 Creatinine 0.44 - 1.00 mg/dL 0.78  0.72  0.92   Sodium 135 - 145 mmol/L 145  137  138   Potassium 3.5 - 5.1 mmol/L 3.4  4.3  4.2   Chloride 98 - 111 mmol/L 105  103  101   CO2 22 - 32 mmol/L 30  29  32   Calcium 8.9 - 10.3 mg/dL 9.5  9.1  9.4     Past Medical History:  Diagnosis Date   Acute postoperative pain 10/03/2017   Anxiety    BiPAP (biphasic positive airway pressure) dependence    at hs   Carpal tunnel syndrome    [facility]estive [facility] [place]) [place]    1/18   CHF (congestive heart failure) (HCC)    Chronic generalized abdominal pain    Chronic rhinitis    COPD (chronic obstructive pulmonary disease) (HCC)    DDD (degenerative disc disease), cervical    DDD  (degenerative disc disease), lumbosacral    Depression    Dyspnea    Edema    Flu    1/18   Gastritis    GERD (gastroesophageal reflux disease)    Hematuria    Hernia of abdominal wall 07/17/2018   Hernia, abdominal    Hypertension    Kidney stones    Low back pain    Lumbar radiculitis    Malodorous urine    Muscle weakness    Obesity    Oxygen dependent    Pneumonia due to COVID-19 virus 02/03/2020   Renal cyst    Sensory urge incontinence    Thyroid activity decreased    9/19   Tobacco abuse    Wheezing     ASSESSMENT 66year old female who presents to the HF clinic for follow up. PMH includes chronic low back pain, HTN, depression, COPD on long-term oxygen, anxiety, carpal tunnel syndrome, long-standing tobacco use and chronic heart failure.   MedRec completed during visit. Patient presenting 48hrs after furoscix dose given 11/13. [facility] Noted HF exacerbation, COPD  exacerbation , cephalic vein occlusion(new), and symptomatic anemia.   PLAN  Repeat BMET today Add SGLT2i as GDMT and to assist with diuresis Continue loop diuretic adjustment and diuresis   Time spent: 20 minutes  Mattia Liford Rodriguez-Guzman PharmD, BCPS 09/01/2022 2:58 PM    Current Outpatient Medications:    acetaminophen (TYLENOL) 500 MG tablet, Take 1,000 mg by mouth every 8 (eight) hours as needed., Disp: , Rfl:    albuterol (VENTOLIN HFA) 108 (90 Base) MCG/ACT inhaler, Inhale 2 puffs into the lungs every 6 (six) hours as needed for wheezing or shortness of breath., Disp: 8 g, Rfl: 1   ambrisentan (LETAIRIS) 5 MG tablet, Take 5 mg by mouth daily., Disp: , Rfl:    amiodarone (PACERONE) 200 MG tablet, Take 1 tablet (200 mg total) by mouth daily., Disp: 30 tablet, Rfl: 0   busPIRone (BUSPAR) 5 MG tablet, Take 1 tablet (5 mg total) by mouth 2 (two) times daily., Disp: 180 tablet, Rfl: 1    butalbital-acetaminophen-caffeine (FIORICET) 50-325-40 MG tablet, Take 1 tablet by mouth every 6 (six) hours as needed for headache. LIMIT USE TO PREVENT MEDICATION OVERUSE HEADACHE, Disp: 14 tablet, Rfl: 0   Cholecalciferol 50 MCG (2000 UT) CAPS, Take 2,000 Units by mouth daily. , Disp: , Rfl:    dapagliflozin propanediol (FARXIGA) 10 MG TABS tablet, Take 1 tablet (10 mg total) by mouth daily before breakfast., Disp: 30 tablet, Rfl: 5   diclofenac Sodium (VOLTAREN) 1 % GEL, Apply topically. Apply to affected area up to 4 times per day (shoulder and lowe back), Disp: , Rfl:    diltiazem (CARDIZEM CD) 180 MG 24 hr capsule, Take 180 mg by mouth daily., Disp: , Rfl:    Fluticasone-Umeclidin-Vilant 100-62.5-25 MCG/INH AEPB, Inhale 1 puff into the lungs daily., Disp: , Rfl:    HYDROcodone-acetaminophen (NORCO/VICODIN) 5-325 MG tablet, Take 1 tablet by mouth every 8 (eight) hours as needed for severe pain. Must last 30 days, Disp: 90 tablet, Rfl: 0   HYDROcodone-acetaminophen (NORCO/VICODIN) 5-325 MG tablet, Take 1 tablet by mouth every 8 (eight) hours as needed for severe pain. Must last 30 days (Patient not taking: Reported on 08/29/2022), Disp: 90 tablet, Rfl: 0   [START ON 10/01/2022] HYDROcodone-acetaminophen (NORCO/VICODIN) 5-325 MG tablet, Take 1 tablet by mouth every 8 (eight) hours as needed for severe pain. Must last 30 days (Patient not taking: Reported on 08/29/2022), Disp: 90 tablet, Rfl: 0   ipratropium-albuterol (DUONEB) 0.5-2.5 (3) MG/3ML SOLN, USE 1 VIAL VIA NEBULIZER EVERY 6 HOURS AS NEEDED FOR SHORTNESS OF BREATH OR WHEEZING, Disp: 1080 mL, Rfl: 0   naloxone (NARCAN) nasal spray 4 mg/0.1 mL, Place 1 spray into the nose as needed for up to 365 doses (for opioid-induced respiratory depresssion). In case of emergency (overdose), spray once into each nostril. If no response within 3 minutes, repeat application and call [phone]. (Patient not taking: Reported on 08/31/2022), Disp: 1 each, Rfl: 0    OXYGEN, Inhale 4 L/min into the lungs continuous. 4 L at rest 8 L with exertion  10 L shower, Disp: , Rfl:    pantoprazole (PROTONIX) 40 MG tablet, Take 1 tablet (40 mg total) by mouth daily. (Patient taking differently: Take 40 mg by mouth 2 (two) times daily.), Disp: 30 tablet, Rfl: 2   potassium chloride SA (KLOR-CON M) 20 MEQ tablet, Take 2 tablets every morning and 1 tablet every evening, Disp: 270 tablet, Rfl: 3   rivaroxaban (XARELTO) 20 MG TABS tablet, Take 1 tablet (20 mg  total) by mouth daily with supper., Disp: 30 tablet, Rfl: 0   roflumilast (DALIRESP) 500 MCG TABS tablet, Take 500 mcg by mouth daily., Disp: , Rfl:    sertraline (ZOLOFT) 100 MG tablet, TAKE 2 TABLETS BY MOUTH ONCE EVERY MORNING, Disp: 180 tablet, Rfl: 1   simvastatin (ZOCOR) 20 MG tablet, Take 20 mg by mouth at bedtime., Disp: , Rfl:    sodium chloride (OCEAN) 0.65 % nasal spray, Place into the nose., Disp: , Rfl:    sucralfate (CARAFATE) 1 g tablet, Take 1 g by mouth 4 (four) times daily., Disp: , Rfl:    tamsulosin (FLOMAX) 0.4 MG CAPS capsule, Take by mouth., Disp: , Rfl:    torsemide (DEMADEX) 20 MG tablet, Take 20 mg by mouth 2 (two) times daily. 80 mg (4 tablets) in the morning, and 40 mg (2 tablets) in the evening, Disp: , Rfl:    varenicline (CHANTIX) 1 MG tablet, Take by mouth. (Patient not taking: Reported on 08/29/2022), Disp: , Rfl:    MEDICATION ADHERENCES TIPS AND STRATEGIES Taking medication as prescribed improves patient outcomes in heart failure (reduces hospitalizations, improves symptoms, increases survival) Side effects of medications can be managed by decreasing doses, switching agents, stopping drugs, or adding additional therapy. Please let someone in the [facility] know if you have having bothersome side effects so we can modify your regimen. Do not alter your medication regimen without talking to [place].  Medication reminders can help patients remember to take drugs on time. If you are  missing or forgetting doses you can try linking behaviors, using pill boxes, or an electronic reminder like an alarm on your phone or an app. Some people can also get automated phone calls as medication reminders.

## 2022-09-02 ENCOUNTER — Encounter: Payer: Self-pay | Admitting: Family Medicine

## 2022-09-02 ENCOUNTER — Ambulatory Visit (INDEPENDENT_AMBULATORY_CARE_PROVIDER_SITE_OTHER): Payer: Medicare HMO | Admitting: Family Medicine

## 2022-09-02 VITALS — BP 90/77 | HR 84 | Resp 18

## 2022-09-02 DIAGNOSIS — H10403 Unspecified chronic conjunctivitis, bilateral: Secondary | ICD-10-CM | POA: Diagnosis not present

## 2022-09-02 MED ORDER — ERYTHROMYCIN 5 MG/GM OP OINT: 1.0000 | TOPICAL_OINTMENT | Freq: Two times a day (BID) | OPHTHALMIC | 0 refills | Status: DC

## 2022-09-02 NOTE — Progress Notes (Signed)
I,[name],acting as a scribe for Theresa Sprout, FNP.,have documented all relevant documentation on the behalf of Theresa Sprout, FNP,as directed by  Theresa Sprout, FNP while in the presence of Theresa Sprout, FNP.  Established patient visit   Patient: Theresa [name]   DOB: [birth year]   66 y.o. Female  MRN: [id number] Visit Date: 09/02/2022  Today's healthcare provider: [name], FNP  Re Introduced to nurse practitioner role and practice setting.  All questions answered.  Discussed provider/patient relationship and expectations.  Subjective    HPI  Patient has had bilateral eye redness and drainage for 4 days. Also has itchy eyes. Patient has been using hot compresses with no relief.  Medications: Outpatient Medications Prior to Visit  Medication Sig   acetaminophen (TYLENOL) 500 MG tablet Take 1,000 mg by mouth every 8 (eight) hours as needed.   albuterol (VENTOLIN HFA) 108 (90 Base) MCG/ACT inhaler Inhale 2 puffs into the lungs every 6 (six) hours as needed for wheezing or shortness of breath.   ambrisentan (LETAIRIS) 5 MG tablet Take 5 mg by mouth daily.   amiodarone (PACERONE) 200 MG tablet Take 1 tablet (200 mg total) by mouth daily.   busPIRone (BUSPAR) 5 MG tablet Take 1 tablet (5 mg total) by mouth 2 (two) times daily.   butalbital-acetaminophen-caffeine (FIORICET) 50-325-40 MG tablet Take 1 tablet by mouth every 6 (six) hours as needed for headache. LIMIT USE TO PREVENT MEDICATION OVERUSE HEADACHE   Cholecalciferol 50 MCG (2000 UT) CAPS Take 2,000 Units by mouth daily.    dapagliflozin propanediol (FARXIGA) 10 MG TABS tablet Take 1 tablet (10 mg total) by mouth daily before breakfast.   diclofenac Sodium (VOLTAREN) 1 % GEL Apply topically. Apply to affected area up to 4 times per day (shoulder and lowe back)   diltiazem (CARDIZEM CD) 180 MG 24 hr capsule Take 180 mg by mouth daily.   Fluticasone-Umeclidin-Vilant 100-62.5-25 MCG/INH AEPB Inhale 1 puff into the lungs  daily.   HYDROcodone-acetaminophen (NORCO/VICODIN) 5-325 MG tablet Take 1 tablet by mouth every 8 (eight) hours as needed for severe pain. Must last 30 days   HYDROcodone-acetaminophen (NORCO/VICODIN) 5-325 MG tablet Take 1 tablet by mouth every 8 (eight) hours as needed for severe pain. Must last 30 days   [START ON 10/01/2022] HYDROcodone-acetaminophen (NORCO/VICODIN) 5-325 MG tablet Take 1 tablet by mouth every 8 (eight) hours as needed for severe pain. Must last 30 days   ipratropium-albuterol (DUONEB) 0.5-2.5 (3) MG/3ML SOLN USE 1 VIAL VIA NEBULIZER EVERY 6 HOURS AS NEEDED FOR SHORTNESS OF BREATH OR WHEEZING   naloxone (NARCAN) nasal spray 4 mg/0.1 mL Place 1 spray into the nose as needed for up to 365 doses (for opioid-induced respiratory depresssion). In case of emergency (overdose), spray once into each nostril. If no response within 3 minutes, repeat application and call [phone].   OXYGEN Inhale 4 L/min into the lungs continuous. 4 L at rest 8 L with exertion  10 L shower   pantoprazole (PROTONIX) 40 MG tablet Take 1 tablet (40 mg total) by mouth daily. (Patient taking differently: Take 40 mg by mouth 2 (two) times daily.)   potassium chloride SA (KLOR-CON M) 20 MEQ tablet Take 2 tablets every morning and 1 tablet every evening   rivaroxaban (XARELTO) 20 MG TABS tablet Take 1 tablet (20 mg total) by mouth daily with supper.   roflumilast (DALIRESP) 500 MCG TABS tablet Take 500 mcg by mouth daily.   sertraline (ZOLOFT) 100  MG tablet TAKE 2 TABLETS BY MOUTH ONCE EVERY MORNING   simvastatin (ZOCOR) 20 MG tablet Take 20 mg by mouth at bedtime.   sucralfate (CARAFATE) 1 g tablet Take 1 g by mouth 4 (four) times daily.   torsemide (DEMADEX) 20 MG tablet Take 20 mg by mouth 2 (two) times daily. 80 mg (4 tablets) in the morning, and 40 mg (2 tablets) in the evening   varenicline (CHANTIX) 1 MG tablet Take by mouth.   [DISCONTINUED] sodium chloride (OCEAN) 0.65 % nasal spray Place into the nose.    [DISCONTINUED] tamsulosin (FLOMAX) 0.4 MG CAPS capsule Take by mouth. (Patient not taking: Reported on 09/02/2022)   No facility-administered medications prior to visit.    Review of Systems  Constitutional:  Negative for appetite change, chills, fatigue and fever.  Eyes:  Positive for redness.  Respiratory:  Negative for chest tightness and shortness of breath.   Cardiovascular:  Negative for chest pain and palpitations.  Gastrointestinal:  Negative for abdominal pain, nausea and vomiting.  Neurological:  Negative for dizziness and weakness.     Objective    BP 90/77 (BP Location: Right Arm, Patient Position: Sitting, Cuff Size: Large)   Pulse 84   Resp 18   SpO2 99%   Physical Exam Vitals and nursing note reviewed.  Constitutional:      General: She is not in acute distress.    Appearance: Normal appearance. She is obese. She is not ill-appearing, toxic-appearing or diaphoretic.  HENT:     Head: Normocephalic and atraumatic.  Eyes:     General: Lids are normal. Vision grossly intact. Gaze aligned appropriately.     Conjunctiva/sclera:     Left eye: Exudate present.   Cardiovascular:     Rate and Rhythm: Normal rate and regular rhythm.     Pulses: Normal pulses.     Heart sounds: Normal heart sounds. No murmur heard.    No friction rub. No gallop.  Pulmonary:     Effort: Pulmonary effort is normal. No respiratory distress.     Breath sounds: No stridor. Wheezing present. No rhonchi or rales.     Comments: Continue SQ diuresis with HF clinic to reach goal dry weight of 197; also has compression hose in place Chest:     Chest wall: No tenderness.  Musculoskeletal:        General: No swelling, tenderness, deformity or signs of injury. Normal range of motion.     Right lower leg: No edema.     Left lower leg: No edema.  Skin:    General: Skin is warm and dry.     Capillary Refill: Capillary refill takes less than 2 seconds.     Coloration: Skin is not jaundiced or pale.      Findings: No bruising, erythema, lesion or rash.  Neurological:     General: No focal deficit present.     Mental Status: She is alert and oriented to person, place, and time. Mental status is at baseline.     Cranial Nerves: No cranial nerve deficit.     Sensory: No sensory deficit.     Motor: No weakness.     Coordination: Coordination normal.  Psychiatric:        Mood and Affect: Mood normal.        Behavior: Behavior normal.        Thought Content: Thought content normal.        Judgment: Judgment normal.     No results  found for any visits on 09/02/22.  Assessment & Plan     Problem List Items Addressed This Visit   None Visit Diagnoses     Chronic bacterial conjunctivitis of both eyes    -  Primary   Relevant Medications   erythromycin ophthalmic ointment      Return if symptoms worsen or fail to improve.      Vonna Kotyk, FNP, have reviewed all documentation for this visit. The documentation on 09/02/22 for the exam, diagnosis, procedures, and orders are all accurate and complete.  Theresa [name], [place] [phone] (phone) [phone] (fax)  [facility]

## 2022-09-02 NOTE — Assessment & Plan Note (Signed)
W/C use with home O2 present, both present at baseline d/t HF and COPD; presents with 4 day s/s of eye redness, and discharge; not improved with home compresses. Also, noted +PHQ screening; pt declines treatment at this time noting that the holidays are always 'a little hard since her daughter passed 5 years ago... she loved Christmas.' Accompanied by friend, Doroteo Bradford

## 2022-09-05 NOTE — Progress Notes (Unsigned)
Patient ID: Theresa [name], female    DOB: 1956-09-11, 66 y.o.   MRN: [id number]  HPI  Theresa [name] is a 66 y/o female with a history of chronic low back pain, HTN, depression, COPD on long-term oxygen, anxiety, carpal tunnel syndrome, long-standing tobacco use and chronic heart failure.  Echo report from 04/29/22 reviewed and showed an EF of 60-65% along with mild MR and no thrombus. Echo report from 08/08/21 reviewed and showed an EF of 60-65% along with mild LAE. Echo report from 09/03/20 reviewed and showed an EF of >55% with mild LAE and severe TR. Echo report from 07/07/2019 reviewed and showed an EF of 60-65% along with mild TR and moderately elevated PA pressure. Echo done 11/03/15 but unable to access results. Echo done on 02/19/15 showed an EF of 50% without valvular stenosis.   Catheterization done 07/11/2019 showed:  Prox RCA to Dist RCA lesion is 50% stenosed. Mid LAD lesion is 50% stenosed. Ost LM to Dist LM lesion is 10% stenosed. Hemodynamic findings consistent with moderate pulmonary hypertension. Normal overall left ventricular function of at least 60% Moderate coronary disease no significant obstructive disease Moderate pulmonary hypertension  Admitted 08/09/22 due to cough and worsening SOB due to COPD exacerbation due to RSV. Hypotension due to overdiuresis. Blood transfusion given due to hemoglobin <7 due to slow GIB. Initially need increased oxygen at 8L but able to be weaned down to baseline 4L. [place] negative for DVT. Discharged after 17 days. Admitted 07/18/22 due to abdominal pain and nausea. Cardiology consult obtained. Troponin elevation thought to be due to demand ischemia. Discharged after 3 days.   She presents today for a follow-up visit with a chief complaint of moderate shortness of breath with minimal exertion. Describes this as chronic in nature although she does feel like she has improved some since last visit. She has associated fatigue, cough, wheezing, pedal edema  (improving), abdominal distention (improving), headaches, chronic pain, anxiety and difficulty sleeping along with this. She denies any palpitations, nausea, chest pain, dizziness or weight gain.   Now wearing compression socks and feels like the pedal edema is much better.   Her chronic oxygen requirement continues to be 6L at rest, 8L with exertion and 10L when in the shower.   Past Medical History:  Diagnosis Date   Acute postoperative pain 10/03/2017   Anxiety    BiPAP (biphasic positive airway pressure) dependence    at hs   Carpal tunnel syndrome    CHF (congestive heart failure) ([place])    1/18   CHF (congestive heart failure) ([facility])    Chronic generalized abdominal pain    Chronic rhinitis    COPD (chronic obstructive pulmonary disease) ([facility])    DDD (degenerative disc disease), cervical    DDD (degenerative disc disease), lumbosacral    Depression    Dyspnea    Edema    Flu    1/18   Gastritis    GERD (gastroesophageal reflux disease)    Hematuria    Hernia of abdominal wall 07/17/2018   Hernia, abdominal    Hypertension    Kidney stones    Low back pain    Lumbar radiculitis    Malodorous urine    Muscle weakness    Obesity    Oxygen dependent    Pneumonia due to COVID-19 virus 02/03/2020   Renal cyst    Sensory urge incontinence    Thyroid activity decreased    9/19   Tobacco abuse  Wheezing    Past Surgical History:  Procedure Laterality Date   ABDOMINAL HYSTERECTOMY     CARDIOVERSION N/A 04/29/2022   Procedure: CARDIOVERSION;  Surgeon: Corey Skains, MD;  Location: [facility];  Service: Cardiovascular;  Laterality: N/A;   CARPAL TUNNEL RELEASE Left 2012   EPIGASTRIC HERNIA REPAIR N/A 08/13/2018   HERNIA REPAIR EPIGASTRIC ADULT; polypropylene mesh reinforcement.  Surgeon: Robert Bellow, MD;  Location: [facility];  Service: General;  Laterality: N/A;   ESOPHAGOGASTRODUODENOSCOPY N/A 06/21/2022   Procedure: ESOPHAGOGASTRODUODENOSCOPY (EGD);  Surgeon:  Toledo, Benay Pike, MD;  Location: [facility];  Service: Gastroenterology;  Laterality: N/A;   RIGHT/LEFT HEART CATH AND CORONARY ANGIOGRAPHY N/A 07/11/2019   Procedure: RIGHT/LEFT HEART CATH AND CORONARY ANGIOGRAPHY;  Surgeon: Yolonda Kida, MD;  Location: [place] [facility];  Service: Cardiovascular;  Laterality: N/A;   TEE WITHOUT CARDIOVERSION N/A 04/29/2022   Procedure: TRANSESOPHAGEAL ECHOCARDIOGRAM (TEE);  Surgeon: Corey Skains, MD;  Location: [facility];  Service: Cardiovascular;  Laterality: N/A;   TONSILLECTOMY     TUBAL LIGATION     UMBILICAL HERNIA REPAIR N/A 08/13/2018   HERNIA REPAIR UMBILICAL ADULT; polypropylene mesh reinforcement Surgeon: Robert Bellow, MD;  Location: [facility];  Service: General;  Laterality: N/A;   Family History  Problem Relation Age of Onset   Heart disease Mother    Stroke Mother    Coronary artery disease Mother    Lung cancer Sister    Cancer Sister    Breast cancer Sister    Alcohol abuse Father    Heart disease Father    Cancer Brother    Cancer Brother    Pneumonia Brother    Prostate cancer Neg Hx    Kidney cancer Neg Hx    Bladder Cancer Neg Hx    Social History   Tobacco Use   Smoking status: Former    Years: 44.00    Types: Cigarettes   Smokeless tobacco: Never   Tobacco comments:    over a half pack  Substance Use Topics   Alcohol use: No   Allergies  Allergen Reactions   Gabapentin Other (See Comments)    Higher doses of 800 mg, 600 mg, 300 mg causes excessive sedation and contributes to numerous mechanical falls. (Unsure if 100 mg dose can be tolerated)   Codeine Nausea And Vomiting   Meloxicam Other (See Comments)    Stomach pain   Prior to Admission medications   Medication Sig Start Date End Date Taking? Authorizing Provider  acetaminophen (TYLENOL) 500 MG tablet Take 1,000 mg by mouth every 8 (eight) hours as needed.   Yes [provider]  albuterol (VENTOLIN HFA) 108 (90 Base) MCG/ACT  inhaler Inhale 2 puffs into the lungs every 6 (six) hours as needed for wheezing or shortness of breath. 10/23/19  Yes Veronese, [place], MD  ambrisentan (LETAIRIS) 5 MG tablet Take 5 mg by mouth daily. 08/24/22 08/24/23 Yes [provider]  amiodarone (PACERONE) 200 MG tablet Take 1 tablet (200 mg total) by mouth daily. 04/30/22  Yes Sharen Hones, MD  busPIRone (BUSPAR) 5 MG tablet TAKE 1 TABLET BY MOUTH TWICE DAILY 09/03/22  Yes Birdie Sons, MD  butalbital-acetaminophen-caffeine (FIORICET) [phone] MG tablet Take 1 tablet by mouth every 6 (six) hours as needed for headache. LIMIT USE TO PREVENT MEDICATION OVERUSE HEADACHE 04/09/22  Yes Emeterio Reeve, DO  Cholecalciferol 50 MCG (2000 UT) CAPS Take 2,000 Units by mouth daily.    Yes [provider]  dapagliflozin propanediol (FARXIGA) 10 MG TABS tablet Take 1 tablet (10 mg total) by mouth daily before breakfast. 08/31/22  Yes Darylene Price A, FNP  diclofenac Sodium (VOLTAREN) 1 % GEL Apply topically. Apply to affected area up to 4 times per day (shoulder and lowe back) 07/10/22 07/10/23 Yes [provider]  diltiazem (CARDIZEM CD) 180 MG 24 hr capsule Take 180 mg by mouth daily. 05/20/22  Yes [provider]  erythromycin ophthalmic ointment Place 1 Application into both eyes in the morning and at bedtime. 09/02/22  Yes [name], FNP  Fluticasone-Umeclidin-Vilant 100-62.5-25 MCG/INH AEPB Inhale 1 puff into the lungs daily. 10/03/18  Yes [provider]  HYDROcodone-acetaminophen (NORCO/VICODIN) 5-325 MG tablet Take 1 tablet by mouth every 8 (eight) hours as needed for severe pain. Must last 30 days 09/01/22 10/01/22 Yes Milinda Pointer, MD  HYDROcodone-acetaminophen (NORCO/VICODIN) 5-325 MG tablet Take 1 tablet by mouth every 8 (eight) hours as needed for severe pain. Must last 30 days 10/01/22 10/31/22 Yes Milinda Pointer, MD  ipratropium-albuterol (DUONEB) 0.5-2.5 (3) MG/3ML SOLN USE 1 VIAL VIA  NEBULIZER EVERY 6 HOURS AS NEEDED FOR SHORTNESS OF BREATH OR WHEEZING 11/30/20  Yes Chrismon, Vickki Muff, PA-C  metolazone (ZAROXOLYN) 2.5 MG tablet Take 1 tablet (2.5 mg total) by mouth every other day. Take 1/2 hour before torsemide 09/06/22  Yes Mikal Blasdell, Aura Fey, FNP  naloxone [facility]) nasal spray 4 mg/0.1 mL Place 1 spray into the nose as needed for up to 365 doses (for opioid-induced respiratory depresssion). In case of emergency (overdose), spray once into each nostril. If no response within 3 minutes, repeat application and call [phone]. 08/03/22 08/03/23 Yes Milinda Pointer, MD  OXYGEN Inhale 4 L/min into the lungs continuous. 4 L at rest 8 L with exertion  10 L shower   Yes [provider]  pantoprazole (PROTONIX) 40 MG tablet Take 1 tablet (40 mg total) by mouth daily. Patient taking differently: Take 40 mg by mouth 2 (two) times daily. 06/23/22  Yes Lorella Nimrod, MD  potassium chloride SA (KLOR-CON M) 20 MEQ tablet Take 2 tablets every morning and 1 tablet every evening 06/22/22  Yes Kaleyah Labreck, Aura Fey, FNP  rivaroxaban (XARELTO) 20 MG TABS tablet Take 1 tablet (20 mg total) by mouth daily with supper. 04/29/22  Yes Sharen Hones, MD  roflumilast (DALIRESP) 500 MCG TABS tablet Take 500 mcg by mouth daily.   Yes [provider]  sertraline (ZOLOFT) 100 MG tablet TAKE 2 TABLETS BY MOUTH ONCE EVERY MORNING 04/12/22  Yes Birdie Sons, MD  simvastatin (ZOCOR) 20 MG tablet Take 20 mg by mouth at bedtime. 04/11/22  Yes [provider]  sucralfate (CARAFATE) 1 g tablet Take 1 g by mouth 4 (four) times daily. 07/21/22  Yes [provider]  torsemide (DEMADEX) 20 MG tablet Take 20 mg by mouth 2 (two) times daily. 80 mg (4 tablets) in the morning, and 40 mg (2 tablets) in the evening   Yes [provider]  varenicline (CHANTIX) 1 MG tablet Take by mouth. 07/11/22  Yes [provider]  HYDROcodone-acetaminophen (NORCO/VICODIN) 5-325 MG tablet Take 1 tablet by  mouth every 8 (eight) hours as needed for severe pain. Must last 30 days 08/03/22 09/02/22  Milinda Pointer, MD    Review of Systems  Constitutional:  Positive for fatigue. Negative for appetite change.  Eyes: Negative.   Respiratory:  Positive for cough (up blood), shortness of breath and wheezing (when walking).   Cardiovascular:  Positive  for leg swelling (better). Negative for chest pain and palpitations.  Gastrointestinal:  Positive for abdominal distention. Negative for abdominal pain and nausea.  Endocrine: Negative.   Genitourinary: Negative.   Musculoskeletal:  Positive for arthralgias (right shoulder) and back pain.  Skin: Negative.   Allergic/Immunologic: Negative.   Neurological:  Positive for headaches. Negative for dizziness and light-headedness.  Hematological:  Negative for adenopathy. Bruises/bleeds easily.  Psychiatric/Behavioral:  Positive for sleep disturbance (wearing oxygen & bipap at night). Negative for agitation, dysphoric mood and suicidal ideas. The patient is nervous/anxious.    Vitals:   09/06/22 1008  BP: 130/77  Pulse: 93  Resp: 18  SpO2: 93%  Weight: 218 lb (98.9 kg)   Wt Readings from Last 3 Encounters:  09/06/22 218 lb (98.9 kg)  08/31/22 218 lb 6 oz (99.1 kg)  08/29/22 220 lb (99.8 kg)   Lab Results  Component Value Date   CREATININE 0.85 09/06/2022   CREATININE 0.78 08/31/2022   CREATININE 0.72 06/21/2022   Physical Exam Vitals and nursing note reviewed. Exam conducted with a chaperone present (friend).  Constitutional:      Appearance: She is well-developed.  HENT:     Head: Normocephalic and atraumatic.  Neck:     Vascular: No JVD.  Cardiovascular:     Rate and Rhythm: Normal rate and regular rhythm.  Pulmonary:     Effort: Pulmonary effort is normal.     Breath sounds: No wheezing, rhonchi or rales.  Abdominal:     General: There is distension.     Palpations: Abdomen is soft.     Tenderness: There is no abdominal  tenderness.  Musculoskeletal:        General: No tenderness.     Cervical back: Normal range of motion and neck supple.     Right lower leg: Edema (2+ pitting) present.     Left lower leg: No edema.  Skin:    General: Skin is warm and dry.  Neurological:     General: No focal deficit present.     Mental Status: She is alert and oriented to person, place, and time.  Psychiatric:        Mood and Affect: Mood normal. Mood is not depressed.        Behavior: Behavior normal.        Thought Content: Thought content normal.   Assessment & Plan:  1: Acute on Chronic heart failure with preserved ejection fraction without structural changes- - NYHA class III - fluid overloaded today although symptomatically better - weighing daily; reminded to call for an overnight weight gain of >2 pounds or a weekly weight gain of >5 pounds - weight unchanged from last visit here 1 week ago - ReDs clip reading 6 days ago was 45%; initial reading was 47% - ReDs clip reading today was 50% - will add metolazone 2.56m QOD for 3 doses; on the days she takes the metolazone, she needs to take an additional 4110m potassium - check BMP next visit - check BMP today as farxiga started at last visit - wearing compression socks daily with improvement of her edema - BNP 08/31/22 was 36.4  2: HTN- - BP looks good (130/77) - saw PCP (PRollene Rotunda7/20/23; getting established with Dr. BrLovie Macadamian the next few weeks - BMP from 08/31/22 reviewed and showed sodium 145, potassium 3.4, creatinine 0.78 and GFR >60  3: COPD- - wearing bipap nightly - oxygen at 6L at rest, 8L on exertion and 10L when showering -  last saw pulmonology Raul Del) 07/27/22 - attempted pulmonary rehab but after 3 weeks, her chronic pain was so much worse that she had to stop   4: Tobacco use- - smoking 1 pack / week - removes herself from the oxygen when smoking  5: Atrial flutter- - previous cardioversion done - saw cardiologist Clayborn Bigness)  05/20/22    Medication list reviewed.   Return in 10 days, sooner if needed.

## 2022-09-06 ENCOUNTER — Ambulatory Visit (HOSPITAL_BASED_OUTPATIENT_CLINIC_OR_DEPARTMENT_OTHER): Payer: Medicare HMO | Admitting: Family

## 2022-09-06 ENCOUNTER — Telehealth: Payer: Self-pay | Admitting: Family

## 2022-09-06 ENCOUNTER — Encounter: Payer: Self-pay | Admitting: Family

## 2022-09-06 ENCOUNTER — Encounter: Payer: Self-pay | Admitting: Pharmacist

## 2022-09-06 ENCOUNTER — Other Ambulatory Visit
Admission: RE | Admit: 2022-09-06 | Discharge: 2022-09-06 | Disposition: A | Discharge status: Home / Self Care | Payer: Medicare HMO | Source: Ambulatory Visit | Attending: Family | Admitting: Family

## 2022-09-06 ENCOUNTER — Other Ambulatory Visit (HOSPITAL_COMMUNITY): Payer: Self-pay

## 2022-09-06 VITALS — BP 130/77 | HR 93 | Resp 18 | Wt 218.0 lb

## 2022-09-06 DIAGNOSIS — I5033 Acute on chronic diastolic (congestive) heart failure: Secondary | ICD-10-CM | POA: Diagnosis not present

## 2022-09-06 DIAGNOSIS — Z87891 Personal history of nicotine dependence: Secondary | ICD-10-CM | POA: Insufficient documentation

## 2022-09-06 DIAGNOSIS — F32A Depression, unspecified: Secondary | ICD-10-CM | POA: Insufficient documentation

## 2022-09-06 DIAGNOSIS — I4892 Unspecified atrial flutter: Secondary | ICD-10-CM

## 2022-09-06 DIAGNOSIS — J449 Chronic obstructive pulmonary disease, unspecified: Secondary | ICD-10-CM

## 2022-09-06 DIAGNOSIS — I10 Essential (primary) hypertension: Secondary | ICD-10-CM

## 2022-09-06 DIAGNOSIS — Z720 Tobacco use: Secondary | ICD-10-CM | POA: Diagnosis not present

## 2022-09-06 DIAGNOSIS — G8929 Other chronic pain: Secondary | ICD-10-CM | POA: Insufficient documentation

## 2022-09-06 DIAGNOSIS — F419 Anxiety disorder, unspecified: Secondary | ICD-10-CM | POA: Insufficient documentation

## 2022-09-06 DIAGNOSIS — J441 Chronic obstructive pulmonary disease with (acute) exacerbation: Secondary | ICD-10-CM | POA: Insufficient documentation

## 2022-09-06 DIAGNOSIS — I110 Hypertensive heart disease with heart failure: Secondary | ICD-10-CM | POA: Insufficient documentation

## 2022-09-06 LAB — BASIC METABOLIC PANEL
Anion gap: 7 (ref 5–15)
BUN: 19 mg/dL (ref 8–23)
CO2: 31 mmol/L (ref 22–32)
Calcium: 8.9 mg/dL (ref 8.9–10.3)
Chloride: 102 mmol/L (ref 98–111)
Creatinine, Ser: 0.85 mg/dL (ref 0.44–1.00)
GFR, Estimated: >60 mL/min (ref >60)
Glucose, Bld: 97 mg/dL (ref 70–99)
Potassium: 3.4 mmol/L — ABNORMAL LOW (ref 3.5–5.1)
Sodium: 140 mmol/L (ref 135–145)

## 2022-09-06 MED ORDER — METOLAZONE 2.5 MG PO TABS: 2.5000 mg | ORAL_TABLET | ORAL | 0 refills | Status: DC

## 2022-09-06 NOTE — Progress Notes (Signed)
ReDS Vest / Clip - 09/06/22 1008       ReDS Vest / Clip   Station Marker A    Ruler Value 31    ReDS Actual Value 50

## 2022-09-06 NOTE — Patient Instructions (Addendum)
Continue weighing daily and call for an overnight weight gain of 3 pounds or more or a weekly weight gain of more than 5 pounds.   If you have voicemail, please make sure your mailbox is cleaned out so that we may leave a message and please make sure to listen to any voicemails.    Take metolazone (booster fluid pill) every other day starting tomorrow. Best to take it 1/2 hour before your morning torsemide. On the days you are taking the metolazone, take an additional 2 potassium tablets.

## 2022-09-06 NOTE — Telephone Encounter (Signed)
Spoke with patient regarding BMP results obtained earlier today. Kidney function looks great although potassium level is slightly low.   Advised her that on the days she takes the metolazone, she needs to take an additional 81mq potassium each day she takes the metolazone. This extra potassium is in addition to her normal daily potassium doses.   She verbalized understanding.

## 2022-09-14 DIAGNOSIS — M25511 Pain in right shoulder: Secondary | ICD-10-CM | POA: Diagnosis not present

## 2022-09-14 DIAGNOSIS — I480 Paroxysmal atrial fibrillation: Secondary | ICD-10-CM | POA: Diagnosis not present

## 2022-09-14 DIAGNOSIS — I110 Hypertensive heart disease with heart failure: Secondary | ICD-10-CM | POA: Diagnosis not present

## 2022-09-14 DIAGNOSIS — I272 Pulmonary hypertension, unspecified: Secondary | ICD-10-CM | POA: Diagnosis not present

## 2022-09-14 DIAGNOSIS — E876 Hypokalemia: Secondary | ICD-10-CM | POA: Diagnosis not present

## 2022-09-14 DIAGNOSIS — R519 Headache, unspecified: Secondary | ICD-10-CM | POA: Diagnosis not present

## 2022-09-14 DIAGNOSIS — G8929 Other chronic pain: Secondary | ICD-10-CM | POA: Diagnosis not present

## 2022-09-14 DIAGNOSIS — M25512 Pain in left shoulder: Secondary | ICD-10-CM | POA: Diagnosis not present

## 2022-09-14 DIAGNOSIS — I509 Heart failure, unspecified: Secondary | ICD-10-CM | POA: Diagnosis not present

## 2022-09-14 DIAGNOSIS — K219 Gastro-esophageal reflux disease without esophagitis: Secondary | ICD-10-CM | POA: Diagnosis not present

## 2022-09-14 NOTE — Progress Notes (Signed)
Patient ID: Theresa [name], female   DOB: 11/18/1955, 66 y.o.   MRN: [id number] Patient ID: Theresa [name], female   DOB: [birth year], 66 y.o.   MRN: [id number] [place] - PHARMACIST COUNSELING NOTE  *HFpEF*  Guideline-Directed Medical Therapy/Evidence Based Medicine  ACE/ARB/ARNI: N/A soft BP and EF > 60% Beta Blocker:  none Aldosterone Antagonist:  none Diuretic: Torsemide 80 mg in AM and 73m qPM ,Furoscix x 2 doses SGLT2i: dapagliflozin 169mdaily  Adherence Assessment  Do you ever forget to take your medication? _0 Yes _1 No  Do you ever skip doses due to side effects? _2 Yes _3 No  Do you have trouble affording your medicines? _4 Yes _5 No  Are you ever unable to pick up your medication due to transportation difficulties? _6 Yes _7 No  Do you ever stop taking your medications because you don't believe they are helping? _8 Yes _9 No  Do you check your weight daily? _10 Yes _11 No   Adherence strategy: none  Barriers to obtaining medications: none  *ReDs clip 47% on 11/13 -> 45% on 11/15 -> 50% today 11/21*  Vital signs: HR 81, BP 116/55, weight (pounds) 218 lbs  ECHO: Date 04/29/22, EF 60-65%, mild MR and no thrombus      Latest Ref Rng & Units 09/06/2022   11:36 AM 08/31/2022   11:05 AM 06/21/2022    4:17 AM  BMP  Glucose 70 - 99 mg/dL 97  99  94   BUN 8 - 23 mg/dL _12 Creatinine 0.44 - 1.00 mg/dL 0.85  0.78  0.72   Sodium 135 - 145 mmol/L 140  145  137   Potassium 3.5 - 5.1 mmol/L 3.4  3.4  4.3   Chloride 98 - 111 mmol/L 102  105  103   CO2 22 - 32 mmol/L _13 Calcium 8.9 - 10.3 mg/dL 8.9  9.5  9.1     Past Medical History:  Diagnosis Date   Acute postoperative pain 10/03/2017   Anxiety    BiPAP (biphasic positive airway pressure) dependence    at hs   Carpal tunnel syndrome    [place]   1[place]18   CHF (congestive heart failure) (HCC)    Chronic generalized abdominal pain     Chronic rhinitis    COPD (chronic obstructive pulmonary disease) (HCC)    DDD (degenerative disc disease), cervical    DDD (degenerative disc disease), lumbosacral    Depression    Dyspnea    Edema    Flu    1/18   Gastritis    GERD (gastroesophageal reflux disease)    Hematuria    Hernia of abdominal wall 07/17/2018   Hernia, abdominal    Hypertension    Kidney stones    Low back pain    Lumbar radiculitis    Malodorous urine    Muscle weakness    Obesity    Oxygen dependent    Pneumonia due to COVID-19 virus 02/03/2020   Renal cyst    Sensory urge incontinence    Thyroid activity decreased    9/19   Tobacco abuse    Wheezing     ASSESSMENT [age]ear old female who presents to the HF clinic for follow up. PMH includes chronic low back pain, HTN, depression, COPD on long-term oxygen, anxiety, carpal tunnel syndrome, long-standing tobacco use and chronic heart failure.   MedRec completed during visit. Patient reports feeling better.  Was able to resume Chantix and denies problems with [place].   PLAN Continue all medication as previously prescribed Add metolazone to current therapy and repeat BMET   Time spent: 10 minutes  Rosslyn Pasion Rodriguez-Guzman PharmD, BCPS 09/14/2022 2:48 PM   Current Outpatient Medications:    acetaminophen (TYLENOL) 500 MG tablet, Take 1,000 mg by mouth every 8 (eight) hours as needed., Disp: , Rfl:    albuterol (VENTOLIN HFA) 108 (90 Base) MCG/ACT inhaler, Inhale 2 puffs into the lungs every 6 (six) hours as needed for wheezing or shortness of breath., Disp: 8 g, Rfl: 1   ambrisentan (LETAIRIS) 5 MG tablet, Take 5 mg by mouth daily., Disp: , Rfl:    amiodarone (PACERONE) 200 MG tablet, Take 1 tablet (200 mg total) by mouth daily., Disp: 30 tablet, Rfl: 0   busPIRone (BUSPAR) 5 MG tablet, TAKE 1 TABLET BY MOUTH TWICE DAILY, Disp: 180 tablet, Rfl: 1   butalbital-acetaminophen-caffeine (FIORICET) 50-325-40 MG tablet, Take 1 tablet by mouth every  6 (six) hours as needed for headache. LIMIT USE TO PREVENT MEDICATION OVERUSE HEADACHE, Disp: 14 tablet, Rfl: 0   Cholecalciferol 50 MCG (2000 UT) CAPS, Take 2,000 Units by mouth daily. , Disp: , Rfl:    dapagliflozin propanediol (FARXIGA) 10 MG TABS tablet, Take 1 tablet (10 mg total) by mouth daily before breakfast., Disp: 30 tablet, Rfl: 5   diclofenac Sodium (VOLTAREN) 1 % GEL, Apply topically. Apply to affected area up to 4 times per day (shoulder and lowe back), Disp: , Rfl:    diltiazem (CARDIZEM CD) 180 MG 24 hr capsule, Take 180 mg by mouth daily., Disp: , Rfl:    erythromycin ophthalmic ointment, Place 1 Application into both eyes in the morning and at bedtime., Disp: 14 g, Rfl: 0   Fluticasone-Umeclidin-Vilant 100-62.5-25 MCG/INH AEPB, Inhale 1 puff into the lungs daily., Disp: , Rfl:    HYDROcodone-acetaminophen (NORCO/VICODIN) 5-325 MG tablet, Take 1 tablet by mouth every 8 (eight) hours as needed for severe pain. Must last 30 days, Disp: 90 tablet, Rfl: 0   HYDROcodone-acetaminophen (NORCO/VICODIN) 5-325 MG tablet, Take 1 tablet by mouth every 8 (eight) hours as needed for severe pain. Must last 30 days, Disp: 90 tablet, Rfl: 0   [START ON 10/01/2022] HYDROcodone-acetaminophen (NORCO/VICODIN) 5-325 MG tablet, Take 1 tablet by mouth every 8 (eight) hours as needed for severe pain. Must last 30 days, Disp: 90 tablet, Rfl: 0   ipratropium-albuterol (DUONEB) 0.5-2.5 (3) MG/3ML SOLN, USE 1 VIAL VIA NEBULIZER EVERY 6 HOURS AS NEEDED FOR SHORTNESS OF BREATH OR WHEEZING, Disp: 1080 mL, Rfl: 0   metolazone (ZAROXOLYN) 2.5 MG tablet, Take 1 tablet (2.5 mg total) by mouth every other day. Take 1/2 hour before torsemide, Disp: 3 tablet, Rfl: 0   naloxone (NARCAN) nasal spray 4 mg/0.1 mL, Place 1 spray into the nose as needed for up to 365 doses (for opioid-induced respiratory depresssion). In case of emergency (overdose), spray once into each nostril. If no response within 3 minutes, repeat application  and call [phone]., Disp: 1 each, Rfl: 0   OXYGEN, Inhale 4 L/min into the lungs continuous. 4 L at rest 8 L with exertion  10 L shower, Disp: , Rfl:    pantoprazole (PROTONIX) 40 MG tablet, Take 1 tablet (40 mg total) by mouth daily. (Patient taking differently: Take 40 mg by mouth 2 (two) times daily.), Disp: 30 tablet, Rfl: 2   potassium chloride SA (KLOR-CON M) 20 MEQ tablet, Take 2 tablets every morning  and 1 tablet every evening, Disp: 270 tablet, Rfl: 3   rivaroxaban (XARELTO) 20 MG TABS tablet, Take 1 tablet (20 mg total) by mouth daily with supper., Disp: 30 tablet, Rfl: 0   roflumilast (DALIRESP) 500 MCG TABS tablet, Take 500 mcg by mouth daily., Disp: , Rfl:    sertraline (ZOLOFT) 100 MG tablet, TAKE 2 TABLETS BY MOUTH ONCE EVERY MORNING, Disp: 180 tablet, Rfl: 1   simvastatin (ZOCOR) 20 MG tablet, Take 20 mg by mouth at bedtime., Disp: , Rfl:    sucralfate (CARAFATE) 1 g tablet, Take 1 g by mouth 4 (four) times daily., Disp: , Rfl:    torsemide (DEMADEX) 20 MG tablet, Take 20 mg by mouth 2 (two) times daily. 80 mg (4 tablets) in the morning, and 40 mg (2 tablets) in the evening, Disp: , Rfl:    varenicline (CHANTIX) 1 MG tablet, Take by mouth., Disp: , Rfl:    MEDICATION ADHERENCES TIPS AND STRATEGIES Taking medication as prescribed improves patient outcomes in heart failure (reduces hospitalizations, improves symptoms, increases survival) Side effects of medications can be managed by decreasing doses, switching agents, stopping drugs, or adding additional therapy. Please let someone in the [facility] know if you have having bothersome side effects so we can modify your regimen. Do not alter your medication regimen without talking to [place].  Medication reminders can help patients remember to take drugs on time. If you are missing or forgetting doses you can try linking behaviors, using pill boxes, or an electronic reminder like an alarm on your phone or an app. Some people can also get  automated phone calls as medication reminders.

## 2022-09-15 DIAGNOSIS — J439 Emphysema, unspecified: Secondary | ICD-10-CM | POA: Diagnosis not present

## 2022-09-15 DIAGNOSIS — R69 Illness, unspecified: Secondary | ICD-10-CM | POA: Diagnosis not present

## 2022-09-15 DIAGNOSIS — F17210 Nicotine dependence, cigarettes, uncomplicated: Secondary | ICD-10-CM | POA: Diagnosis not present

## 2022-09-15 DIAGNOSIS — I10 Essential (primary) hypertension: Secondary | ICD-10-CM | POA: Diagnosis not present

## 2022-09-16 ENCOUNTER — Telehealth: Payer: Self-pay | Admitting: Family

## 2022-09-16 ENCOUNTER — Other Ambulatory Visit
Admission: RE | Admit: 2022-09-16 | Discharge: 2022-09-16 | Disposition: A | Discharge status: Home / Self Care | Payer: Medicare HMO | Source: Ambulatory Visit | Attending: Family | Admitting: Family

## 2022-09-16 ENCOUNTER — Encounter: Payer: Self-pay | Admitting: Family

## 2022-09-16 ENCOUNTER — Ambulatory Visit (HOSPITAL_BASED_OUTPATIENT_CLINIC_OR_DEPARTMENT_OTHER): Payer: Medicare HMO | Admitting: Family

## 2022-09-16 ENCOUNTER — Other Ambulatory Visit: Payer: Self-pay | Admitting: Family

## 2022-09-16 VITALS — BP 131/54 | HR 95 | Resp 20 | Wt 209.0 lb

## 2022-09-16 DIAGNOSIS — Z720 Tobacco use: Secondary | ICD-10-CM | POA: Diagnosis not present

## 2022-09-16 DIAGNOSIS — I5032 Chronic diastolic (congestive) heart failure: Secondary | ICD-10-CM

## 2022-09-16 DIAGNOSIS — Z87891 Personal history of nicotine dependence: Secondary | ICD-10-CM | POA: Insufficient documentation

## 2022-09-16 DIAGNOSIS — J449 Chronic obstructive pulmonary disease, unspecified: Secondary | ICD-10-CM

## 2022-09-16 DIAGNOSIS — I4892 Unspecified atrial flutter: Secondary | ICD-10-CM | POA: Insufficient documentation

## 2022-09-16 DIAGNOSIS — F32A Depression, unspecified: Secondary | ICD-10-CM | POA: Insufficient documentation

## 2022-09-16 DIAGNOSIS — G8929 Other chronic pain: Secondary | ICD-10-CM | POA: Insufficient documentation

## 2022-09-16 DIAGNOSIS — I110 Hypertensive heart disease with heart failure: Secondary | ICD-10-CM | POA: Insufficient documentation

## 2022-09-16 DIAGNOSIS — I10 Essential (primary) hypertension: Secondary | ICD-10-CM | POA: Diagnosis not present

## 2022-09-16 DIAGNOSIS — J441 Chronic obstructive pulmonary disease with (acute) exacerbation: Secondary | ICD-10-CM | POA: Insufficient documentation

## 2022-09-16 DIAGNOSIS — F419 Anxiety disorder, unspecified: Secondary | ICD-10-CM | POA: Insufficient documentation

## 2022-09-16 LAB — BASIC METABOLIC PANEL
Anion gap: 12 (ref 5–15)
BUN: 17 mg/dL (ref 8–23)
CO2: 33 mmol/L — ABNORMAL HIGH (ref 22–32)
Calcium: 9.1 mg/dL (ref 8.9–10.3)
Chloride: 96 mmol/L — ABNORMAL LOW (ref 98–111)
Creatinine, Ser: 0.71 mg/dL (ref 0.44–1.00)
GFR, Estimated: >60 mL/min (ref >60)
Glucose, Bld: 107 mg/dL — ABNORMAL HIGH (ref 70–99)
Potassium: 2.5 mmol/L — CL (ref 3.5–5.1)
Sodium: 141 mmol/L (ref 135–145)

## 2022-09-16 MED ORDER — SPIRONOLACTONE 25 MG PO TABS: 25.0000 mg | ORAL_TABLET | Freq: Every day | ORAL | 5 refills | Status: DC

## 2022-09-16 NOTE — Progress Notes (Unsigned)
ReDS Vest / Clip - 09/16/22 1042       ReDS Vest / Clip   Station Marker A    Ruler Value 33    ReDS Actual Value 37

## 2022-09-16 NOTE — Progress Notes (Signed)
Patient ID: Theresa [name], female    DOB: 1956-07-13, 66 y.o.   MRN: [id number]  HPI  Theresa [name] is a 65 y/o female with a history of chronic low back pain, HTN, depression, COPD on long-term oxygen, anxiety, carpal tunnel syndrome, long-standing tobacco use and chronic heart failure.  Echo report from 04/29/22 reviewed and showed an EF of 60-65% along with mild MR and no thrombus. Echo report from 08/08/21 reviewed and showed an EF of 60-65% along with mild LAE. Echo report from 09/03/20 reviewed and showed an EF of >55% with mild LAE and severe TR. Echo report from 07/07/2019 reviewed and showed an EF of 60-65% along with mild TR and moderately elevated PA pressure. Echo done 11/03/15 but unable to access results. Echo done on 02/19/15 showed an EF of 50% without valvular stenosis.   Catheterization done 07/11/2019 showed:  Prox RCA to Dist RCA lesion is 50% stenosed. Mid LAD lesion is 50% stenosed. Ost LM to Dist LM lesion is 10% stenosed. Hemodynamic findings consistent with moderate pulmonary hypertension. Normal overall left ventricular function of at least 60% Moderate coronary disease no significant obstructive disease Moderate pulmonary hypertension  Admitted 08/09/22 due to cough and worsening SOB due to COPD exacerbation due to RSV. Hypotension due to overdiuresis. Blood transfusion given due to hemoglobin <7 due to slow GIB. Initially need increased oxygen at 8L but able to be weaned down to baseline 4L. [place] negative for DVT. Discharged after 17 days. Admitted 07/18/22 due to abdominal pain and nausea. Cardiology consult obtained. Troponin elevation thought to be due to demand ischemia. Discharged after 3 days.   Theresa [name] presents today for a follow-up visit with a chief complaint of minimal shortness of breath with moderate exertion. Describes this as chronic in nature although much improved from last visit. Theresa [name] has associated fatigue, cough, headaches, chronic pain, anxiety and chronic  difficulty sleeping along with this. Theresa [name] denies any dizziness, wheezing, chest pain, pedal edema, palpitations or weight gain.   Overall Theresa [name] feels much better than last week.   Theresa [name] has taken the 3 doses of metolazone 2.3m along with extra 656m potassium for 3 days as well  Her chronic oxygen requirement continues to be 6L at rest, 8L with exertion and 10L when in the shower.   Past Medical History:  Diagnosis Date   Acute postoperative pain 10/03/2017   Anxiety    BiPAP (biphasic positive airway pressure) dependence    at hs   Carpal tunnel syndrome    [place]   1[place]   CHF (congestive heart failure) (HCC)    Chronic generalized abdominal pain    Chronic rhinitis    COPD (chronic obstructive pulmonary disease) (HCC)    DDD (degenerative disc disease), cervical    DDD (degenerative disc disease), lumbosacral    Depression    Dyspnea    Edema    Flu    1/18   Gastritis    GERD (gastroesophageal reflux disease)    Hematuria    Hernia of abdominal wall 07/17/2018   Hernia, abdominal    Hypertension    Kidney stones    Low back pain    Lumbar radiculitis    Malodorous urine    Muscle weakness    Obesity    Oxygen dependent    Pneumonia due to COVID-19 virus 02/03/2020   Renal cyst    Sensory urge incontinence    Thyroid activity decreased    9/19  Tobacco abuse    Wheezing    Past Surgical History:  Procedure Laterality Date   ABDOMINAL HYSTERECTOMY     CARDIOVERSION N/A 04/29/2022   Procedure: CARDIOVERSION;  Surgeon: Corey Skains, MD;  Location: [facility] ORS;  Service: Cardiovascular;  Laterality: N/A;   CARPAL TUNNEL RELEASE Left 2012   EPIGASTRIC HERNIA REPAIR N/A 08/13/2018   HERNIA REPAIR EPIGASTRIC ADULT; polypropylene mesh reinforcement.  Surgeon: Robert Bellow, MD;  Location: [facility] ORS;  Service: General;  Laterality: N/A;   ESOPHAGOGASTRODUODENOSCOPY N/A 06/21/2022   Procedure: ESOPHAGOGASTRODUODENOSCOPY (EGD);  Surgeon:  Toledo, Benay Pike, MD;  Location: [facility];  Service: Gastroenterology;  Laterality: N/A;   RIGHT/LEFT HEART CATH AND CORONARY ANGIOGRAPHY N/A 07/11/2019   Procedure: RIGHT/LEFT HEART CATH AND CORONARY ANGIOGRAPHY;  Surgeon: Yolonda Kida, MD;  Location: [facility];  Service: Cardiovascular;  Laterality: N/A;   TEE WITHOUT CARDIOVERSION N/A 04/29/2022   Procedure: TRANSESOPHAGEAL ECHOCARDIOGRAM (TEE);  Surgeon: Corey Skains, MD;  Location: [facility];  Service: Cardiovascular;  Laterality: N/A;   TONSILLECTOMY     TUBAL LIGATION     UMBILICAL HERNIA REPAIR N/A 08/13/2018   HERNIA REPAIR UMBILICAL ADULT; polypropylene mesh reinforcement Surgeon: Robert Bellow, MD;  Location: [facility] ORS;  Service: General;  Laterality: N/A;   Family History  Problem Relation Age of Onset   Heart disease Mother    Stroke Mother    Coronary artery disease Mother    Lung cancer Sister    Cancer Sister    Breast cancer Sister    Alcohol abuse Father    Heart disease Father    Cancer Brother    Cancer Brother    Pneumonia Brother    Prostate cancer Neg Hx    Kidney cancer Neg Hx    Bladder Cancer Neg Hx    Social History   Tobacco Use   Smoking status: Former    Years: 44.00    Types: Cigarettes   Smokeless tobacco: Never   Tobacco comments:    over a half pack  Substance Use Topics   Alcohol use: No   Allergies  Allergen Reactions   Gabapentin Other (See Comments)    Higher doses of 800 mg, 600 mg, 300 mg causes excessive sedation and contributes to numerous mechanical falls. (Unsure if 100 mg dose can be tolerated)   Codeine Nausea And Vomiting   Meloxicam Other (See Comments)    Stomach pain   Prior to Admission medications   Medication Sig Start Date End Date Taking? Authorizing Provider  acetaminophen (TYLENOL) 500 MG tablet Take 1,000 mg by mouth every 8 (eight) hours as needed.   Yes [provider]  albuterol (VENTOLIN HFA) 108 (90 Base) MCG/ACT  inhaler Inhale 2 puffs into the lungs every 6 (six) hours as needed for wheezing or shortness of breath. 10/23/19  Yes Verones[place], [place], MD  ambrisentan (LETAIRIS) 5 MG tablet Take 5 mg by mouth daily. 08/24/22 08/24/23 Yes [provider]  amiodarone (PACERONE) 200 MG tablet Take 1 tablet (200 mg total) by mouth daily. 04/30/22  Yes Sharen Hones, MD  busPIRone (BUSPAR) 5 MG tablet TAKE 1 TABLET BY MOUTH TWICE DAILY 09/03/22  Yes Birdie Sons, MD  butalbital-acetaminophen-caffeine (FIORICET) [phone] MG tablet Take 1 tablet by mouth every 6 (six) hours as needed for headache. LIMIT USE TO PREVENT MEDICATION OVERUSE HEADACHE 04/09/22  Yes Emeterio Reeve, DO  Cholecalciferol 50 MCG (2000 UT) CAPS Take 2,000 Units by mouth daily.  Yes [provider]  dapagliflozin propanediol (FARXIGA) 10 MG TABS tablet Take 1 tablet (10 mg total) by mouth daily before breakfast. 08/31/22  Yes Darylene Price A, FNP  diclofenac Sodium (VOLTAREN) 1 % GEL Apply topically. Apply to affected area up to 4 times per day (shoulder and lowe back) 07/10/22 07/10/23 Yes [provider]  diltiazem (CARDIZEM CD) 180 MG 24 hr capsule Take 180 mg by mouth daily. 05/20/22  Yes [provider]  erythromycin ophthalmic ointment Place 1 Application into both eyes in the morning and at bedtime. 09/02/22  Yes [name], FNP  Fluticasone-Umeclidin-Vilant 100-62.5-25 MCG/INH AEPB Inhale 1 puff into the lungs daily. 10/03/18  Yes [provider]  HYDROcodone-acetaminophen (NORCO/VICODIN) 5-325 MG tablet Take 1 tablet by mouth every 8 (eight) hours as needed for severe pain. Must last 30 days 09/01/22 10/01/22 Yes Milinda Pointer, MD  ipratropium-albuterol (DUONEB) 0.5-2.5 (3) MG/3ML SOLN USE 1 VIAL VIA NEBULIZER EVERY 6 HOURS AS NEEDED FOR SHORTNESS OF BREATH OR WHEEZING 11/30/20  Yes Chrismon, Vickki Muff, PA-C  metolazone (ZAROXOLYN) 2.5 MG tablet Take 1 tablet (2.5 mg total) by mouth every  other day. Take 1/2 hour before torsemide 09/06/22  Yes Sydny Schnitzler, [place] A, FNP  OXYGEN Inhale 4 L/min into the lungs continuous. 4 L at rest 8 L with exertion  10 L shower   Yes [provider]  pantoprazole (PROTONIX) 40 MG tablet Take 1 tablet (40 mg total) by mouth daily. Patient taking differently: Take 40 mg by mouth 2 (two) times daily. 06/23/22  Yes Lorella Nimrod, MD  potassium chloride SA (KLOR-CON M) 20 MEQ tablet Take 2 tablets every morning and 1 tablet every evening 06/22/22  Yes Tabia Landowski, Aura Fey, FNP  rivaroxaban (XARELTO) 20 MG TABS tablet Take 1 tablet (20 mg total) by mouth daily with supper. 04/29/22  Yes Sharen Hones, MD  roflumilast (DALIRESP) 500 MCG TABS tablet Take 500 mcg by mouth daily.   Yes [provider]  sertraline (ZOLOFT) 100 MG tablet TAKE 2 TABLETS BY MOUTH ONCE EVERY MORNING 04/12/22  Yes Birdie Sons, MD  simvastatin (ZOCOR) 20 MG tablet Take 20 mg by mouth at bedtime. 04/11/22  Yes [provider]  sucralfate (CARAFATE) 1 g tablet Take 1 g by mouth 4 (four) times daily. 07/21/22  Yes [provider]  torsemide (DEMADEX) 20 MG tablet Take 20 mg by mouth 2 (two) times daily. 80 mg (4 tablets) in the morning, and 40 mg (2 tablets) in the evening   Yes [provider]  varenicline (CHANTIX) 1 MG tablet Take by mouth. 07/11/22  Yes [provider]  HYDROcodone-acetaminophen (NORCO/VICODIN) 5-325 MG tablet Take 1 tablet by mouth every 8 (eight) hours as needed for severe pain. Must last 30 days 08/03/22 09/02/22  Milinda Pointer, MD  HYDROcodone-acetaminophen (NORCO/VICODIN) 5-325 MG tablet Take 1 tablet by mouth every 8 (eight) hours as needed for severe pain. Must last 30 days Patient not taking: Reported on 09/16/2022 10/01/22 10/31/22  Milinda Pointer, MD  naloxone [facility]) nasal spray 4 mg/0.1 mL Place 1 spray into the nose as needed for up to 365 doses (for opioid-induced respiratory depresssion). In case of  emergency (overdose), spray once into each nostril. If no response within 3 minutes, repeat application and call [phone]. Patient not taking: Reported on 09/16/2022 08/03/22 08/03/23  Milinda Pointer, MD    Review of Systems  Constitutional:  Positive for fatigue. Negative for appetite change.  Eyes: Negative.   Respiratory:  Positive for cough (up blood) and shortness of breath (improving). Negative for wheezing.   Cardiovascular:  Negative for chest pain, palpitations and leg swelling.  Gastrointestinal:  Negative for abdominal distention, abdominal pain and nausea.  Endocrine: Negative.   Genitourinary: Negative.   Musculoskeletal:  Positive for arthralgias (right shoulder) and back pain.  Skin: Negative.   Allergic/Immunologic: Negative.   Neurological:  Positive for headaches. Negative for dizziness and light-headedness.  Hematological:  Negative for adenopathy. Bruises/bleeds easily.  Psychiatric/Behavioral:  Positive for sleep disturbance (wearing oxygen & bipap at night). Negative for agitation, dysphoric mood and suicidal ideas. The patient is nervous/anxious.    Vitals:   09/16/22 1042  BP: (!) 131/54  Pulse: 95  Resp: 20  SpO2: 93%  Weight: 209 lb (94.8 kg)   Wt Readings from Last 3 Encounters:  09/16/22 209 lb (94.8 kg)  09/06/22 218 lb (98.9 kg)  08/31/22 218 lb 6 oz (99.1 kg)   Lab Results  Component Value Date   CREATININE 0.85 09/06/2022   CREATININE 0.78 08/31/2022   CREATININE 0.72 06/21/2022   Physical Exam Vitals and nursing note reviewed.  Constitutional:      Appearance: Theresa [name] is well-developed.  HENT:     Head: Normocephalic and atraumatic.  Neck:     Vascular: No JVD.  Cardiovascular:     Rate and Rhythm: Normal rate and regular rhythm.  Pulmonary:     Effort: Pulmonary effort is normal.     Breath sounds: No wheezing, rhonchi or rales.  Abdominal:     General: There is no distension.     Palpations: Abdomen is soft.     Tenderness: There is  no abdominal tenderness.  Musculoskeletal:        General: No tenderness.     Cervical back: Normal range of motion and neck supple.     Right lower leg: No edema.     Left lower leg: No edema.  Skin:    General: Skin is warm and dry.  Neurological:     General: No focal deficit present.     Mental Status: Theresa [name] is alert and oriented to person, place, and time.  Psychiatric:        Mood and Affect: Mood normal. Mood is not depressed.        Behavior: Behavior normal.        Thought Content: Thought content normal.   Assessment & Plan:  1: Chronic heart failure with preserved ejection fraction without structural changes- - NYHA class II - euvolemic today - weighing daily; reminded to call for an overnight weight gain of >2 pounds or a weekly weight gain of >5 pounds - weight down 9 pounds from last visit here 10 days ago - ReDs initial reading was 47% - ReDs clip reading today was 37% - check BMP today due to metolazone/ extra potassium doses; will call patient after results obtained; consider a dose of furoscix or possibly spironolactone - will refer to Dr. Haroldine Laws for further evaluation - wearing compression socks daily with improvement of her edema - BNP 08/31/22 was 36.4  2: HTN- - BP looks good (131/54) - saw PCP Rollene Rotunda) 05/05/22; getting established with Dr. Lovie Macadamia in the next few weeks - BMP from 09/06/22 reviewed and showed sodium 140, potassium 3.4, creatinine 0.85 and GFR >60  3: COPD- - wearing bipap nightly - oxygen at 6L at rest, 8L on exertion and 10L when showering - last saw pulmonology Raul Del) 07/27/22; returns 09/21/22 - attempted pulmonary  rehab but after 3 weeks, her chronic pain was so much worse that Theresa [name] had to stop   4: Tobacco use- - smoking 1 pack / week - removes herself from the oxygen when smoking  5: Atrial flutter- - previous cardioversion done - saw cardiologist Clayborn Bigness) 05/20/22    Medication list reviewed.   Return in 2 weeks to  see Dr. Haroldine Laws, sooner if needed.

## 2022-09-16 NOTE — Telephone Encounter (Signed)
Called patient with recent BMP results obtained earlier today. Renal function looks great after metolazone and extra potassium but her potassium is now 2.5. No complaints of leg cramping, weakness or blurry vision.   Advised patient to take an additional 63mq potassium tonight and 669m potassium tomorrow. This will be in addition to her regular potassium amount. Will also start spironolactone 2554mnce daily.   Advised her to not use any furoscix due to her potassium level and will recheck labs on 09/22/22. Patient verbalized understanding.

## 2022-09-16 NOTE — Patient Instructions (Signed)
Continue weighing daily and call for an overnight weight gain of 3 pounds or more or a weekly weight gain of more than 5 pounds.   If you have voicemail, please make sure your mailbox is cleaned out so that we may leave a message and please make sure to listen to any voicemails.     

## 2022-09-17 ENCOUNTER — Encounter: Payer: Self-pay | Admitting: Family

## 2022-09-19 DIAGNOSIS — R519 Headache, unspecified: Secondary | ICD-10-CM | POA: Diagnosis not present

## 2022-09-19 DIAGNOSIS — E876 Hypokalemia: Secondary | ICD-10-CM | POA: Diagnosis not present

## 2022-09-19 DIAGNOSIS — K219 Gastro-esophageal reflux disease without esophagitis: Secondary | ICD-10-CM | POA: Diagnosis not present

## 2022-09-19 NOTE — Progress Notes (Unsigned)
PROVIDER NOTE: Interpretation of information contained herein should be left to medically-trained personnel. Specific patient instructions are provided elsewhere under "Patient Instructions" section of medical record. This document was created in part using STT-dictation technology, any transcriptional errors that may result from this process are unintentional.  Patient: Theresa [name] Type: Established DOB: [birth year] MRN: [id number] PCP: Birdie Sons, MD  Service: Procedure DOS: 09/20/2022 Setting: Ambulatory Location: Ambulatory outpatient facility Delivery: Face-to-face Provider: Gaspar Cola, MD Specialty: Interventional Pain Management Specialty designation: 09 Location: Outpatient facility Ref. Prov.: Milinda Pointer, MD    Procedure:           Type: Lumbar Facet, Medial Branch Block(s)  #7   Laterality: Right  Level: L2, L3, L4, L5, & S1 Medial Branch Level(s). Injecting these levels blocks the L3-4, L4-5 and L5-S1 lumbar facet joints. Imaging: Fluoroscopic guidance         Anesthesia: Local anesthesia (1-2% Lidocaine) Anxiolysis: IV Versed         Sedation: No Sedation                       DOS: 09/20/2022 Performed by: Gaspar Cola, MD  Primary Purpose: Diagnostic/Therapeutic Indications: Low back pain severe enough to impact quality of life or function. 1. Lumbar facet syndrome (Bilateral) (R>L)   2. Spondylosis without myelopathy or radiculopathy, lumbosacral region   3. Lumbar facet hypertrophy (multilevel)   4. DDD (degenerative disc disease), lumbar   5. Chronic low back pain (1ry area of Pain) (Bilateral) (R>L) w/o sciatica   6. Chronic anticoagulation (XARELTO)    NAS-11 Pain score:   Pre-procedure: 9 /10   Post-procedure: 6 /10     Position / Prep / Materials:  Position: Prone  Prep solution: DuraPrep (Iodine Povacrylex [0.7% available iodine] and Isopropyl Alcohol, 74% w/w) Area Prepped: Posterolateral Lumbosacral Spine (Wide prep: From  the lower border of the scapula down to the end of the tailbone and from flank to flank.)  Materials:  Tray: Block Needle(s):  Type: Spinal  Gauge (G): 22  Length: 5-in Qty: 4     Pre-op H&P Assessment:  Theresa [name] is a 66 y.o. (year old), female patient, seen today for interventional treatment. She  has a past surgical history that includes Abdominal hysterectomy; Tonsillectomy; Carpal tunnel release (Left, 2012); Tubal ligation; epigastric hernia repair (N/A, 08/13/2018); Umbilical hernia repair (N/A, 08/13/2018); RIGHT/LEFT HEART CATH AND CORONARY ANGIOGRAPHY (N/A, 07/11/2019); TEE without cardioversion (N/A, 04/29/2022); Cardioversion (N/A, 04/29/2022); and Esophagogastroduodenoscopy (N/A, 06/21/2022). Theresa [name] has a current medication list which includes the following prescription(s): acetaminophen, albuterol, ambrisentan, amiodarone, bupropion, buspirone, butalbital-acetaminophen-caffeine, cholecalciferol, dapagliflozin propanediol, diclofenac sodium, diltiazem, erythromycin, fluticasone-umeclidin-vilant, hydrocodone-acetaminophen, [START ON 10/01/2022] hydrocodone-acetaminophen, ipratropium-albuterol, metolazone, naloxone, oxygen-helium, pantoprazole, potassium chloride sa, rivaroxaban, roflumilast, sertraline, simvastatin, sucralfate, torsemide, varenicline, hydrocodone-acetaminophen, and spironolactone. Her primarily concern today is the No chief complaint on file.  Initial Vital Signs:  Pulse/HCG Rate: 84  Temp: 98.4 F (36.9 C) Resp: 20 BP: (!) 118/53 SpO2: 94 % (4 LITERS)  BMI: Estimated body mass index is 38.23 kg/m as calculated from the following:   Height as of this encounter: _0  (1.575 m).   Weight as of this encounter: 209 lb (94.8 kg).  Risk Assessment: Allergies: Reviewed. She is allergic to gabapentin, codeine, and meloxicam.  Allergy Precautions: None required Coagulopathies: Reviewed. None identified.  Blood-thinner therapy: None at this time Active  Infection(s): Reviewed. None identified. Theresa [name] is afebrile  Site Confirmation: Theresa [name] was asked to confirm  the procedure and laterality before marking the site Procedure checklist: Completed Consent: Before the procedure and under the influence of no sedative(s), amnesic(s), or anxiolytics, the patient was informed of the treatment options, risks and possible complications. To fulfill our ethical and legal obligations, as recommended by the American Medical Association's Code of Ethics, I have informed the patient of my clinical impression; the nature and purpose of the treatment or procedure; the risks, benefits, and possible complications of the intervention; the alternatives, including doing nothing; the risk(s) and benefit(s) of the alternative treatment(s) or procedure(s); and the risk(s) and benefit(s) of doing nothing. The patient was provided information about the general risks and possible complications associated with the procedure. These may include, but are not limited to: failure to achieve desired goals, infection, bleeding, organ or nerve damage, allergic reactions, paralysis, and death. In addition, the patient was informed of those risks and complications associated to Spine-related procedures, such as failure to decrease pain; infection (i.e.: Meningitis, epidural or intraspinal abscess); bleeding (i.e.: epidural hematoma, subarachnoid hemorrhage, or any other type of intraspinal or peri-dural bleeding); organ or nerve damage (i.e.: Any type of peripheral nerve, nerve root, or spinal cord injury) with subsequent damage to sensory, motor, and/or autonomic systems, resulting in permanent pain, numbness, and/or weakness of one or several areas of the body; allergic reactions; (i.e.: anaphylactic reaction); and/or death. Furthermore, the patient was informed of those risks and complications associated with the medications. These include, but are not limited to: allergic reactions  (i.e.: anaphylactic or anaphylactoid reaction(s)); adrenal axis suppression; blood sugar elevation that in diabetics may result in ketoacidosis or comma; water retention that in patients with history of congestive heart failure may result in shortness of breath, pulmonary edema, and decompensation with resultant heart failure; weight gain; swelling or edema; medication-induced neural toxicity; particulate matter embolism and blood vessel occlusion with resultant organ, and/or nervous system infarction; and/or aseptic necrosis of one or more joints. Finally, the patient was informed that Medicine is not an exact science; therefore, there is also the possibility of unforeseen or unpredictable risks and/or possible complications that may result in a catastrophic outcome. The patient indicated having understood very clearly. We have given the patient no guarantees and we have made no promises. Enough time was given to the patient to ask questions, all of which were answered to the patient's satisfaction. Theresa [name] has indicated that she wanted to continue with the procedure. Attestation: I, the ordering provider, attest that I have discussed with the patient the benefits, risks, side-effects, alternatives, likelihood of achieving goals, and potential problems during recovery for the procedure that I have provided informed consent. Date  Time: 09/20/2022 10:25 AM  Pre-Procedure Preparation:  Monitoring: As per clinic protocol. Respiration, ETCO2, SpO2, BP, heart rate and rhythm monitor placed and checked for adequate function Safety Precautions: Patient was assessed for positional comfort and pressure points before starting the procedure. Time-out: I initiated and conducted the "Time-out" before starting the procedure, as per protocol. The patient was asked to participate by confirming the accuracy of the "Time Out" information. Verification of the correct person, site, and procedure were performed and  confirmed by me, the nursing staff, and the patient. "Time-out" conducted as per Joint Commission's Universal Protocol (UP.01.01.01). Time: 1130  Description of Procedure:          Laterality: Right Targeted Levels:  L2, L3, L4, L5, & S1 Medial Branch Level(s)  Safety Precautions: Aspiration looking for blood return was conducted prior to all injections.  At no point did we inject any substances, as a needle was being advanced. Before injecting, the patient was told to immediately notify me if she was experiencing any new onset of "ringing in the ears, or metallic taste in the mouth". No attempts were made at seeking any paresthesias. Safe injection practices and needle disposal techniques used. Medications properly checked for expiration dates. SDV (single dose vial) medications used. After the completion of the procedure, all disposable equipment used was discarded in the proper designated medical waste containers. Local Anesthesia: Protocol guidelines were followed. The patient was positioned over the fluoroscopy table. The area was prepped in the usual manner. The time-out was completed. The target area was identified using fluoroscopy. A 12-in long, straight, sterile hemostat was used with fluoroscopic guidance to locate the targets for each level blocked. Once located, the skin was marked with an approved surgical skin marker. Once all sites were marked, the skin (epidermis, dermis, and hypodermis), as well as deeper tissues (fat, connective tissue and muscle) were infiltrated with a small amount of a short-acting local anesthetic, loaded on a 10cc syringe with a 25G, 1.5-in  Needle. An appropriate amount of time was allowed for local anesthetics to take effect before proceeding to the next step. Local Anesthetic: Lidocaine 2.0% The unused portion of the local anesthetic was discarded in the proper designated containers. Technical description of process:  L2 Medial Branch Nerve Block (MBB): The  target area for the L2 medial branch is at the junction of the postero-lateral aspect of the superior articular process and the superior, posterior, and medial edge of the transverse process of L3. Under fluoroscopic guidance, a Quincke needle was inserted until contact was made with os over the superior postero-lateral aspect of the pedicular shadow (target area). After negative aspiration for blood, 0.5 mL of the nerve block solution was injected without difficulty or complication. The needle was removed intact. L3 Medial Branch Nerve Block (MBB): The target area for the L3 medial branch is at the junction of the postero-lateral aspect of the superior articular process and the superior, posterior, and medial edge of the transverse process of L4. Under fluoroscopic guidance, a Quincke needle was inserted until contact was made with os over the superior postero-lateral aspect of the pedicular shadow (target area). After negative aspiration for blood, 0.5 mL of the nerve block solution was injected without difficulty or complication. The needle was removed intact. L4 Medial Branch Nerve Block (MBB): The target area for the L4 medial branch is at the junction of the postero-lateral aspect of the superior articular process and the superior, posterior, and medial edge of the transverse process of L5. Under fluoroscopic guidance, a Quincke needle was inserted until contact was made with os over the superior postero-lateral aspect of the pedicular shadow (target area). After negative aspiration for blood, 0.5 mL of the nerve block solution was injected without difficulty or complication. The needle was removed intact. L5 Medial Branch Nerve Block (MBB): The target area for the L5 medial branch is at the junction of the postero-lateral aspect of the superior articular process and the superior, posterior, and medial edge of the sacral ala. Under fluoroscopic guidance, a Quincke needle was inserted until contact was made  with os over the superior postero-lateral aspect of the pedicular shadow (target area). After negative aspiration for blood, 0.5 mL of the nerve block solution was injected without difficulty or complication. The needle was removed intact. S1 Medial Branch Nerve Block (MBB): The target area for  the S1 medial branch is at the posterior and inferior 6 o'clock position of the L5-S1 facet joint. Under fluoroscopic guidance, the Quincke needle inserted for the L5 MBB was redirected until contact was made with os over the inferior and postero aspect of the sacrum, at the 6 o' clock position under the L5-S1 facet joint (Target area). After negative aspiration for blood, 0.5 mL of the nerve block solution was injected without difficulty or complication. The needle was removed intact.  Once the entire procedure was completed, the treated area was cleaned, making sure to leave some of the prepping solution back to take advantage of its long term bactericidal properties.         Illustration of the posterior view of the lumbar spine and the posterior neural structures. Laminae of L2 through S1 are labeled. DPRL5, dorsal primary ramus of L5; DPRS1, dorsal primary ramus of S1; DPR3, dorsal primary ramus of L3; FJ, facet (zygapophyseal) joint L3-L4; I, inferior articular process of L4; LB1, lateral branch of dorsal primary ramus of L1; IAB, inferior articular branches from L3 medial branch (supplies L4-L5 facet joint); IBP, intermediate branch plexus; MB3, medial branch of dorsal primary ramus of L3; NR3, third lumbar nerve root; S, superior articular process of L5; SAB, superior articular branches from L4 (supplies L4-5 facet joint also); TP3, transverse process of L3.  Vitals:   09/20/22 1024 09/20/22 1128 09/20/22 1133  BP: (!) 118/53 116/70 105/70  Pulse: 84 74 78  Resp: 20 (!) 22 20  Temp: 98.4 F (36.9 C)    TempSrc: Temporal    SpO2: 94% 100% 100%  Weight: 209 lb (94.8 kg)    Height: _0  (1.575 m)        Start Time: 1130 hrs. End Time: 1135 hrs.  Imaging Guidance (Spinal):          Type of Imaging Technique: Fluoroscopy Guidance (Spinal) Indication(s): Assistance in needle guidance and placement for procedures requiring needle placement in or near specific anatomical locations not easily accessible without such assistance. Exposure Time: Please see nurses notes. Contrast: None used. Fluoroscopic Guidance: I was personally present during the use of fluoroscopy. "Tunnel Vision Technique" used to obtain the best possible view of the target area. Parallax error corrected before commencing the procedure. "Direction-depth-direction" technique used to introduce the needle under continuous pulsed fluoroscopy. Once target was reached, antero-posterior, oblique, and lateral fluoroscopic projection used confirm needle placement in all planes. Images permanently stored in EMR. Interpretation: No contrast injected. I personally interpreted the imaging intraoperatively. Adequate needle placement confirmed in multiple planes. Permanent images saved into the patient's record.  Antibiotic Prophylaxis:   Anti-infectives (From admission, onward)    None      Indication(s): None identified  Post-operative Assessment:  Post-procedure Vital Signs:  Pulse/HCG Rate: 78  Temp: 98.4 F (36.9 C) Resp: 20 BP: 105/70 SpO2: 100 %  EBL: None  Complications: No immediate post-treatment complications observed by team, or reported by patient.  Note: The patient tolerated the entire procedure well. A repeat set of vitals were taken after the procedure and the patient was kept under observation following institutional policy, for this type of procedure. Post-procedural neurological assessment was performed, showing return to baseline, prior to discharge. The patient was provided with post-procedure discharge instructions, including a section on how to identify potential problems. Should any problems arise  concerning this procedure, the patient was given instructions to immediately contact us, at any time, without hesitation. In any case, we plan to  contact the patient by telephone for a follow-up status report regarding this interventional procedure.  Comments:  No additional relevant information.  Plan of Care  Orders:  Orders Placed This Encounter  Procedures   LUMBAR FACET(MEDIAL BRANCH NERVE BLOCK) MBNB    Scheduling Instructions:     Procedure: Lumbar facet block (AKA.: Lumbosacral medial branch nerve block)     Side: Right-sided     Level: L3-4 & L5-S1 Facets (L2, L3, L4, L5, & S1 Medial Branch Nerves)     Sedation: Patient's choice.     Timeframe: Today    Order Specific Question:   Where will this procedure be performed?    Answer:   [facility]   DG [facility] C-ARM 1-60 MIN NO REPORT    Intraoperative interpretation by procedural physician at [place].    Standing Status:   Standing    Number of Occurrences:   1    Order Specific Question:   Reason for exam:    Answer:   Assistance in needle guidance and placement for procedures requiring needle placement in or near specific anatomical locations not easily accessible without such assistance.   Informed Consent Details: Physician/Practitioner Attestation; Transcribe to consent form and obtain patient signature    Nursing Order: Transcribe to consent form and obtain patient signature. Note: Always confirm laterality of pain with Theresa [name], before procedure.    Order Specific Question:   Physician/Practitioner attestation of informed consent for procedure/surgical case    Answer:   I, the physician/practitioner, attest that I have discussed with the patient the benefits, risks, side effects, alternatives, likelihood of achieving goals and potential problems during recovery for the procedure that I have provided informed consent.    Order Specific Question:   Procedure    Answer:   Lumbar Facet Block  under  fluoroscopic guidance    Order Specific Question:   Physician/Practitioner performing the procedure    Answer:   Jaeden Westbay A. 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   Provide equipment / supplies at bedside    Procedure tray: "Block Tray" (Disposable  single use) Skin infiltration needle: Regular 1.5-in, 25-G, (x1) Block Needle type: Spinal Amount/quantity: 4 Size: Regular (3.5-inch) Gauge: 22G    Standing Status:   Standing    Number of Occurrences:   1    Order Specific Question:   Specify    Answer:   Block Tray   Care order/instruction: Please confirm that the patient has stopped the Xarelto (Rivaroxaban) x 3 days prior to procedure or surgery.    Please confirm that the patient has stopped the Xarelto (Rivaroxaban) x 3 days prior to procedure or surgery.    Standing Status:   Standing    Number of Occurrences:   1   Bleeding precautions    Standing Status:   Standing    Number of Occurrences:   1   Chronic Opioid Analgesic:  Hydrocodone/APAP 5/325 one tablet every 8 hours (15 mg/day of hydrocodone) MME/day: 15 mg/day.   Medications ordered for procedure: Meds ordered this encounter  Medications   lidocaine (XYLOCAINE) 2 % (with pres) injection 400 mg   pentafluoroprop-tetrafluoroeth (GEBAUERS) aerosol   ropivacaine (PF) 2 mg/mL (0.2%) (NAROPIN) injection 9 mL   triamcinolone acetonide (KENALOG-40) injection 40  mg   Medications administered: We administered lidocaine, pentafluoroprop-tetrafluoroeth, ropivacaine (PF) 2 mg/mL (0.2%), and triamcinolone acetonide.  See the medical record for exact dosing, route, and time of administration.  Follow-up plan:   Return in about 2 weeks (around 10/04/2022) for Proc-day (T,Th), (VV), (PPE).       Interventional Therapies   Risk  Complexity Considerations:   Estimated body mass index is 32.01 kg/m as calculated from the following:   Height as of this encounter: _0  (1.575 m).   Weight as of this encounter: 175 lb (79.4 kg). NOTE: NO further Lumbar RFA until BMI <35.   Planned  Pending:   Therapeutic right vs bilateral lumbar facet MBB #6   Under consideration:   Possible right lumbar facet RFA #2 (last done on 10/03/2017)  Diagnostic bilateral cervical facet MBB #1  Palliative left L2-3 LESI #4    Completed:   Palliative right lumbar facet MBB x5 (11/11/2021) (100/100/65/65)  Palliative left lumbar facet MBB x6 (11/11/2021) (100/100/0/0)  Therapeutic left L2-3 LESI x3 (09/23/2021) (80/80/80)  Diagnostic/palliative left L4 TFESI x1 (12/26/2017) (100/100/50/>50)  Diagnostic/palliative left L4-5 LESI x2 (07/30/2019) (100/100/0/0)  Diagnostic/palliative right L4 TFESI x1 (01/25/2018) (100/100/50/50)  Diagnostic/palliative right L4-5 LESI x1 (01/25/2018) (100/100/50/50)  Palliative left lumbar facet RFA x1 (06/27/2017) (0/0/100/>50)  Palliative right lumbar facet RFA x1 (10/03/2017) (100/100/75)    Therapeutic  Palliative (PRN) options:   Therapeutic lumbar facet MBB   Therapeutic left L2 LESI  Therapeutic lumbar facet RFA       Recent Visits Date Type Provider Dept  08/03/22 Office Visit Milinda Pointer, MD [facility]  Showing recent visits within past 90 days and meeting all other requirements Today's Visits Date Type Provider Dept  09/20/22 Procedure visit Milinda Pointer, MD [facility]  Showing today's visits and meeting all other requirements Future Appointments Date Type Provider Dept  10/04/22 Appointment Milinda Pointer, MD [facility]  10/26/22 Appointment Milinda Pointer, MD [facility]  Showing future appointments within next 90 days and meeting all other requirements  Disposition: Discharge home  Discharge (Date  Time):  09/20/2022; 1138 hrs.   Primary Care Physician: Birdie Sons, MD Location: [facility] Note by: Gaspar Cola, MD Date: 09/20/2022; Time: 12:15 PM  Disclaimer:  Medicine is not an Chief Strategy Officer. The only guarantee in medicine is that nothing is guaranteed. It is important to note that the decision to proceed with this intervention was based on the information collected from the patient. The Data and conclusions were drawn from the patient's questionnaire, the interview, and the physical examination. Because the information was provided in large part by the patient, it cannot be guaranteed that it has not been purposely or unconsciously manipulated. Every effort has been made to obtain as much relevant data as possible for this evaluation. It is important to note that the conclusions that lead to this procedure are derived in large part from the available data. Always take into account that the treatment will also be dependent on availability of resources and existing treatment guidelines, considered by other Pain Management Practitioners as being common knowledge and practice, at the time of the intervention. For Medico-Legal purposes, it is also important to point out that variation in procedural techniques and pharmacological choices are the acceptable norm. The indications, contraindications, technique, and results of the above procedure should only be interpreted and judged by a Board-Certified Interventional Pain Specialist with extensive familiarity and expertise in the same exact  procedure and technique.

## 2022-09-20 ENCOUNTER — Ambulatory Visit: Payer: Medicare HMO | Attending: Pain Medicine | Admitting: Pain Medicine

## 2022-09-20 ENCOUNTER — Ambulatory Visit
Admission: RE | Admit: 2022-09-20 | Discharge: 2022-09-20 | Disposition: A | Discharge status: Home / Self Care | Payer: Medicare HMO | Source: Ambulatory Visit | Attending: Pain Medicine | Admitting: Pain Medicine

## 2022-09-20 ENCOUNTER — Encounter: Payer: Self-pay | Admitting: Pain Medicine

## 2022-09-20 VITALS — BP 105/70 | HR 78 | Temp 98.4°F | Resp 20 | Ht 62.0 in | Wt 209.0 lb

## 2022-09-20 DIAGNOSIS — Z7901 Long term (current) use of anticoagulants: Secondary | ICD-10-CM | POA: Insufficient documentation

## 2022-09-20 DIAGNOSIS — M47817 Spondylosis without myelopathy or radiculopathy, lumbosacral region: Secondary | ICD-10-CM | POA: Diagnosis present

## 2022-09-20 DIAGNOSIS — M47816 Spondylosis without myelopathy or radiculopathy, lumbar region: Secondary | ICD-10-CM | POA: Diagnosis not present

## 2022-09-20 DIAGNOSIS — M79605 Pain in left leg: Secondary | ICD-10-CM | POA: Diagnosis present

## 2022-09-20 DIAGNOSIS — M545 Low back pain, unspecified: Secondary | ICD-10-CM | POA: Insufficient documentation

## 2022-09-20 DIAGNOSIS — G8929 Other chronic pain: Secondary | ICD-10-CM | POA: Insufficient documentation

## 2022-09-20 DIAGNOSIS — M5136 Other intervertebral disc degeneration, lumbar region: Secondary | ICD-10-CM | POA: Insufficient documentation

## 2022-09-20 DIAGNOSIS — M79604 Pain in right leg: Secondary | ICD-10-CM | POA: Diagnosis present

## 2022-09-20 MED ORDER — PENTAFLUOROPROP-TETRAFLUOROETH EX AERO
INHALATION_SPRAY | Freq: Once | CUTANEOUS | Status: AC
  Administered 2022-09-20: 30 via TOPICAL

## 2022-09-20 MED ORDER — LIDOCAINE HCL 2 % IJ SOLN
20.0000 mL | Freq: Once | INTRAMUSCULAR | Status: AC
  Administered 2022-09-20: 400 mg
  Filled 2022-09-20: qty 20

## 2022-09-20 MED ORDER — ROPIVACAINE HCL 2 MG/ML IJ SOLN
9.0000 mL | Freq: Once | INTRAMUSCULAR | Status: AC
  Administered 2022-09-20: 9 mL via PERINEURAL
  Filled 2022-09-20: qty 20

## 2022-09-20 MED ORDER — TRIAMCINOLONE ACETONIDE 40 MG/ML IJ SUSP
40.0000 mg | Freq: Once | INTRAMUSCULAR | Status: AC
  Administered 2022-09-20: 40 mg
  Filled 2022-09-20: qty 1

## 2022-09-20 NOTE — Patient Instructions (Signed)
____________________________________________________________________________________________  Post-Procedure Discharge Instructions  Instructions:  Apply ice:   Purpose: This will minimize any swelling and discomfort after procedure.   When: Day of procedure, as soon as you get home.  How: Fill a plastic sandwich bag with crushed ice. Cover it with a small towel and apply to injection site.  How long: (15 min on, 15 min off) Apply for 15 minutes then remove x 15 minutes.  Repeat sequence on day of procedure, until you go to bed.  Apply heat:   Purpose: To treat any soreness and discomfort from the procedure.  When: Starting the next day after the procedure.  How: Apply heat to procedure site starting the day following the procedure.  How long: May continue to repeat daily, until discomfort goes away.  Food intake: Start with clear liquids (like water) and advance to regular food, as tolerated.   Physical activities: Keep activities to a minimum for the first 8 hours after the procedure. After that, then as tolerated.  Driving: If you have received any sedation, be responsible and do not drive. You are not allowed to drive for 24 hours after having sedation.  Blood thinner: (Applies only to those taking blood thinners) You may restart your blood thinner 6 hours after your procedure.  Insulin: (Applies only to Diabetic patients taking insulin) As soon as you can eat, you may resume your normal dosing schedule.  Infection prevention: Keep procedure site clean and dry. Shower daily and clean area with soap and water.  Post-procedure Pain Diary: Extremely important that this be done correctly and accurately. Recorded information will be used to determine the next step in treatment. For the purpose of accuracy, follow these rules:  Evaluate only the area treated. Do not report or include pain from an untreated area. For the purpose of this evaluation, ignore all other areas of pain,  except for the treated area.  After your procedure, avoid taking a long nap and attempting to complete the pain diary after you wake up. Instead, set your alarm clock to go off every hour, on the hour, for the initial 8 hours after the procedure. Document the duration of the numbing medicine, and the relief you are getting from it.  Do not go to sleep and attempt to complete it later. It will not be accurate. If you received sedation, it is likely that you were given a medication that may cause amnesia. Because of this, completing the diary at a later time may cause the information to be inaccurate. This information is needed to plan your care.  Follow-up appointment: Keep your post-procedure follow-up evaluation appointment after the procedure (usually 2 weeks for most procedures, 6 weeks for radiofrequencies). DO NOT FORGET to bring you pain diary with you.   Expect: (What should I expect to see with my procedure?)  From numbing medicine (AKA: Local Anesthetics): Numbness or decrease in pain. You may also experience some weakness, which if present, could last for the duration of the local anesthetic.  Onset: Full effect within 15 minutes of injected.  Duration: It will depend on the type of local anesthetic used. On the average, 1 to 8 hours.   From steroids (Applies only if steroids were used): Decrease in swelling or inflammation. Once inflammation is improved, relief of the pain will follow.  Onset of benefits: Depends on the amount of swelling present. The more swelling, the longer it will take for the benefits to be seen. In some cases, up to 10 days.    may typically last 2 weeks (the duration of the steroids). Frequent: Cramps (if they occur, drink Gatorade and take over-the-counter Magnesium 450-500 mg once to twice a day); water retention with  temporary weight gain; increases in blood sugar; decreased immune system response; increased appetite. Occasional: Facial flushing (red, warm cheeks); mood swings; menstrual changes. Uncommon: Long-term decrease or suppression of natural hormones; bone thinning. (These are more common with higher doses or more frequent use. This is why we prefer that our patients avoid having any injection therapies in other practices.)  Very Rare: Severe mood changes; psychosis; aseptic necrosis. From procedure: Some discomfort is to be expected once the numbing medicine wears off. This should be minimal if ice and heat are applied as instructed.  Call if: (When should I call?) You experience numbness and weakness that gets worse with time, as opposed to wearing off. New onset bowel or bladder incontinence. (Applies only to procedures done in the spine)  Emergency Numbers: Durning business hours (Monday - Thursday, 8:00 AM - 4:00 PM) (Friday, 9:00 AM - 12:00 Noon): [phone] After hours: [phone] NOTE: If you are having a problem and are unable connect with, or to talk to a provider, then go to your nearest urgent care or emergency department. If the problem is serious and urgent, please call 911. ____________________________________________________________________________________________    ____________________________________________________________________________________________  Blood Thinners  IMPORTANT NOTICE:  If you take any of these, make sure to notify the nursing staff.  Failure to do so may result in injury.  Recommended time intervals to stop and restart blood-thinners, before & after invasive procedures  Generic Name Brand Name Pre-procedure. Stop this long before procedure. Post-procedure. Minimum waiting period before restarting.  Abciximab Reopro 15 days 2 hrs  Alteplase Activase 10 days 10 days  Anagrelide Agrylin    Apixaban Eliquis 3 days 6 hrs  Cilostazol Pletal 3  days 5 hrs  Clopidogrel Plavix 7-10 days 2 hrs  Dabigatran Pradaxa 5 days 6 hrs  Dalteparin Fragmin 24 hours 4 hrs  Dipyridamole Aggrenox 11days 2 hrs  Edoxaban Lixiana; Savaysa 3 days 2 hrs  Enoxaparin  Lovenox 24 hours 4 hrs  Eptifibatide Integrillin 8 hours 2 hrs  Fondaparinux  Arixtra 72 hours 12 hrs  Hydroxychloroquine Plaquenil 11 days   Prasugrel Effient 7-10 days 6 hrs  Reteplase Retavase 10 days 10 days  Rivaroxaban Xarelto 3 days 6 hrs  Ticagrelor Brilinta 5-7 days 6 hrs  Ticlopidine Ticlid 10-14 days 2 hrs  Tinzaparin Innohep 24 hours 4 hrs  Tirofiban Aggrastat 8 hours 2 hrs  Warfarin Coumadin 5 days 2 hrs   Other medications with blood-thinning effects  Product indications Generic (Brand) names Note  Cholesterol Lipitor Stop 4 days before procedure  Blood thinner (injectable) Heparin (LMW or LMWH Heparin) Stop 24 hours before procedure  Cancer Ibrutinib (Imbruvica) Stop 7 days before procedure  Malaria/Rheumatoid Hydroxychloroquine (Plaquenil) Stop 11 days before procedure  Thrombolytics  10 days before or after procedures   Over-the-counter (OTC) Products with blood-thinning effects  Product Common names Stop Time  Aspirin > 325 mg Goody Powders, Excedrin, etc. 11 days  Aspirin ? 81 mg  7 days  Fish oil  4 days  Garlic supplements  7 days  Ginkgo biloba  36 hours  Ginseng  24 hours  NSAIDs Ibuprofen, Naprosyn, etc. 3 days  Vitamin E  4 days   ____________________________________________________________________________________________

## 2022-09-21 ENCOUNTER — Encounter
Admission: RE | Admit: 2022-09-21 | Discharge: 2022-09-21 | Disposition: A | Discharge status: Home / Self Care | Payer: Medicare HMO | Source: Ambulatory Visit | Attending: Family | Admitting: Family

## 2022-09-21 ENCOUNTER — Telehealth: Payer: Self-pay | Admitting: *Deleted

## 2022-09-21 DIAGNOSIS — I5032 Chronic diastolic (congestive) heart failure: Secondary | ICD-10-CM | POA: Diagnosis not present

## 2022-09-21 DIAGNOSIS — M25511 Pain in right shoulder: Secondary | ICD-10-CM | POA: Diagnosis not present

## 2022-09-21 DIAGNOSIS — M12812 Other specific arthropathies, not elsewhere classified, left shoulder: Secondary | ICD-10-CM | POA: Diagnosis not present

## 2022-09-21 DIAGNOSIS — J449 Chronic obstructive pulmonary disease, unspecified: Secondary | ICD-10-CM | POA: Diagnosis not present

## 2022-09-21 DIAGNOSIS — Z01812 Encounter for preprocedural laboratory examination: Secondary | ICD-10-CM | POA: Diagnosis not present

## 2022-09-21 DIAGNOSIS — J9610 Chronic respiratory failure, unspecified whether with hypoxia or hypercapnia: Secondary | ICD-10-CM | POA: Diagnosis not present

## 2022-09-21 DIAGNOSIS — M25512 Pain in left shoulder: Secondary | ICD-10-CM | POA: Diagnosis not present

## 2022-09-21 DIAGNOSIS — M12811 Other specific arthropathies, not elsewhere classified, right shoulder: Secondary | ICD-10-CM | POA: Diagnosis not present

## 2022-09-21 DIAGNOSIS — G8929 Other chronic pain: Secondary | ICD-10-CM | POA: Diagnosis not present

## 2022-09-21 LAB — BASIC METABOLIC PANEL
Anion gap: 9 (ref 5–15)
BUN: 31 mg/dL — ABNORMAL HIGH (ref 8–23)
CO2: 27 mmol/L (ref 22–32)
Calcium: 9.3 mg/dL (ref 8.9–10.3)
Chloride: 103 mmol/L (ref 98–111)
Creatinine, Ser: 1.16 mg/dL — ABNORMAL HIGH (ref 0.44–1.00)
GFR, Estimated: 52 mL/min — ABNORMAL LOW (ref >60)
Glucose, Bld: 131 mg/dL — ABNORMAL HIGH (ref 70–99)
Potassium: 4.7 mmol/L (ref 3.5–5.1)
Sodium: 139 mmol/L (ref 135–145)

## 2022-09-21 NOTE — Telephone Encounter (Signed)
Called for post procedure check. Denies any issues. States will restart blood thinner today.

## 2022-09-22 ENCOUNTER — Other Ambulatory Visit (HOSPITAL_COMMUNITY): Payer: Self-pay

## 2022-09-22 ENCOUNTER — Telehealth: Payer: Self-pay

## 2022-09-22 NOTE — Telephone Encounter (Signed)
Patient called to report a 5 lb weight gain overnight and worsening shortness of breath. She states she currently takes torsemide 80 mg in the AM and 40 mg PM, but has not taken anything today. She has two furoscix injection kits available at home. Patient instructed, per Darylene Price: Do not take torsemide today. Administer one furoscix injection and take an extra two potassium tablets today. Tomorrow, return to normal medication schedule with your torsemide and potassium and call office with update on weight and symptoms. Patient verbalized understanding and had no questions.  Georg Ruddle, RN

## 2022-09-27 DIAGNOSIS — Z87898 Personal history of other specified conditions: Secondary | ICD-10-CM | POA: Diagnosis not present

## 2022-09-27 DIAGNOSIS — K269 Duodenal ulcer, unspecified as acute or chronic, without hemorrhage or perforation: Secondary | ICD-10-CM | POA: Diagnosis not present

## 2022-09-27 DIAGNOSIS — D500 Iron deficiency anemia secondary to blood loss (chronic): Secondary | ICD-10-CM | POA: Diagnosis not present

## 2022-09-27 DIAGNOSIS — J439 Emphysema, unspecified: Secondary | ICD-10-CM | POA: Diagnosis not present

## 2022-09-27 DIAGNOSIS — I2510 Atherosclerotic heart disease of native coronary artery without angina pectoris: Secondary | ICD-10-CM | POA: Diagnosis not present

## 2022-09-27 DIAGNOSIS — Z8619 Personal history of other infectious and parasitic diseases: Secondary | ICD-10-CM | POA: Diagnosis not present

## 2022-09-29 ENCOUNTER — Encounter: Payer: Self-pay | Admitting: Internal Medicine

## 2022-09-29 ENCOUNTER — Ambulatory Visit: Payer: Medicare HMO | Attending: Internal Medicine | Admitting: Internal Medicine

## 2022-09-29 VITALS — BP 110/64 | HR 87 | Resp 18 | Wt 212.0 lb

## 2022-09-29 DIAGNOSIS — I272 Pulmonary hypertension, unspecified: Secondary | ICD-10-CM | POA: Diagnosis not present

## 2022-09-29 DIAGNOSIS — J449 Chronic obstructive pulmonary disease, unspecified: Secondary | ICD-10-CM | POA: Diagnosis not present

## 2022-09-29 DIAGNOSIS — I5032 Chronic diastolic (congestive) heart failure: Secondary | ICD-10-CM | POA: Diagnosis not present

## 2022-09-29 NOTE — Progress Notes (Signed)
[facility] CONSULT NOTE  Referring Physician: Primary Care: Primary Cardiologist:  HPI:  Ms Theresa [name] is a 66 y/o female with a history of chronic low back pain, HTN, depression, COPD on long-term oxygen, anxiety, carpal tunnel syndrome, long-standing tobacco use and chronic heart failure.   Echo report from 04/29/22 reviewed and showed an EF of 60-65% along with mild MR and no thrombus. Echo report from 08/08/21 reviewed and showed an EF of 60-65% along with mild LAE. Echo report from 09/03/20 reviewed and showed an EF of >55% with mild LAE and severe TR. Echo report from 07/07/2019 reviewed and showed an EF of 60-65% along with mild TR and moderately elevated PA pressure. Echo done 11/03/15 but unable to access results. Echo done on 02/19/15 showed an EF of 50% without valvular stenosis.    Catheterization done 07/11/2019 showed:  Prox RCA to Dist RCA lesion is 50% stenosed. Mid LAD lesion is 50% stenosed. Ost LM to Dist LM lesion is 10% stenosed. Hemodynamic findings consistent with moderate pulmonary hypertension. Normal overall left ventricular function of at least 60% Moderate coronary disease no significant obstructive disease Mild to moderate pulmonary hypertension   RA 10 PA 53/28 (39) PCWP 13 Fick CO/CI 14/7.0 PA sat 85% on 5L  PVR 1.8   TEE 7/23 EF 60-65% RV normal. Bubble negative  CT chest 9/23 Moderate to severe COPD No PE    PFTs 3/23  from Dr. Justice Britain note FEV1 1.1L   Admitted 08/09/22 due to cough and worsening SOB due to COPD exacerbation due to RSV. Hypotension due to overdiuresis. Blood transfusion given due to hemoglobin <7 due to slow GIB.Cardiology consult obtained. Troponin elevation thought to be due to demand ischemia. Discharged after 3 days.    She presents today for a follow-up visit with a chief complaint of minimal shortness of breath with moderate exertion. Describes this as chronic in nature although much improved from last visit. She has  associated fatigue, cough, headaches, chronic pain, anxiety and chronic difficulty sleeping along with this. She denies any dizziness, wheezing, chest pain, pedal edema, palpitations or weight gain.    Her chronic oxygen requirement continues to be 6L at rest, 8L with exertion and 10L when in the shower. Follows with Dr. Raul Del in Pulmonary. Has been on Letairis x 1 year but hasn't noticed any benefit  Quit smoking in September after 50 years 1-1.5 PPD. Struggles with ADL due mainly to back pain but gets SOB easily. Uses electric scooter at [employer]. LE swelling comes and goes. No CP. On torsemide 80/40. Drinks a lot of water every day. Uses Bipap at nght   Review of Systems: [y] = yes, _0  = no   General: Weight gain _1 ; Weight loss _2 ; Anorexia _3 ; Fatigue Blue.Reese ]; Fever _4 ; Chills _5 ; Weakness _6   Cardiac: Chest pain/pressure _7 ; Resting SOB [ y]; Exertional SOB Blue.Reese ]; Orthopnea _8 ; Pedal Edema Blue.Reese ]; Palpitations _9 ; Syncope _10 ; Presyncope _11 ; Paroxysmal nocturnal dyspnea_12   Pulmonary: Cough Blue.Reese ]; [facility] ]; Hemoptysis_13 ; Sputum _14 ; Snoring _15   GI: Vomiting_16 ; Dysphagia_17 ; Melena_18 ; Hematochezia _19 ; Heartburn_20 ; Abdominal pain _21 ; Constipation _22 ; Diarrhea _23 ; BRBPR _24   GU: Hematuria_25 ; Dysuria _26 ; Nocturia_27   Vascular: Pain in legs with walking _28 ; Pain in feet with lying flat _29 ; Non-healing sores _30 ; Stroke _31 ; TIA _32 ;  Slurred speech _0 ;  Neuro: Headaches_1 ; Vertigo_2 ; Seizures_3 ; Paresthesias_4 ;Blurred vision _5 ; Diplopia _6 ; Vision changes _7   Ortho/Skin: Arthritis Blue.Reese ]; Joint pain Blue.Reese ]; Muscle pain _8 ; Joint swelling _9 ; Back Pain Blue.Reese ]; Rash _10   Psych: Depression_11 ; Anxiety_12   Heme: Bleeding problems _13 ; Clotting disorders _14 ; Anemia _15   Endocrine: Diabetes _16 ; Thyroid dysfunction_17    Past Medical History:  Diagnosis Date   Acute postoperative pain 10/03/2017   Anxiety    BiPAP (biphasic positive airway pressure) dependence    at hs    Carpal tunnel syndrome    CHF (congestive heart failure) ([place])    1/18   CHF (congestive heart failure) ([facility])    Chronic generalized abdominal pain    Chronic rhinitis    COPD (chronic obstructive pulmonary disease) (H[facility])    DDD (degenerative disc disease), cervical    DDD (degenerative disc disease), lumbosacral    Depression    Dyspnea    Edema    Flu    1/18   Gastritis    GERD (gastroesophageal reflux disease)    Hematuria    Hernia of abdominal wall 07/17/2018   Hernia, abdominal    Hypertension    Kidney stones    Low back pain    Lumbar radiculitis    Malodorous urine    Muscle weakness    Obesity    Oxygen dependent    Pneumonia due to COVID-19 virus 02/03/2020   Renal cyst    Sensory urge incontinence    Thyroid activity decreased    9/19   Tobacco abuse    Wheezing     Current Outpatient Medications  Medication Sig Dispense Refill   acetaminophen (TYLENOL) 500 MG tablet Take 1,000 mg by mouth every 8 (eight) hours as needed.     albuterol (VENTOLIN HFA) 108 (90 Base) MCG/ACT inhaler Inhale 2 puffs into the lungs every 6 (six) hours as needed for wheezing or shortness of breath. 8 g 1   ambrisentan (LETAIRIS) 5 MG tablet Take 5 mg by mouth daily.     amiodarone (PACERONE) 200 MG tablet Take 1 tablet (200 mg total) by mouth daily. 30 tablet 0   buPROPion (WELLBUTRIN SR) 150 MG 12 hr tablet Take 150 mg by mouth daily as needed (for smoking cessation).     busPIRone (BUSPAR) 5 MG tablet TAKE 1 TABLET BY MOUTH TWICE DAILY 180 tablet 1   butalbital-acetaminophen-caffeine (FIORICET) 50-325-40 MG tablet Take 1 tablet by mouth every 6 (six) hours as needed for headache. LIMIT USE TO PREVENT MEDICATION OVERUSE HEADACHE 14 tablet 0   Cholecalciferol 50 MCG (2000 UT) CAPS Take 2,000 Units by mouth daily.      dapagliflozin propanediol (FARXIGA) 10 MG TABS tablet Take 1 tablet (10 mg total) by mouth daily before breakfast. 30 tablet 5   diclofenac Sodium (VOLTAREN) 1 %  GEL Apply topically. Apply to affected area up to 4 times per day (shoulder and lowe back)     diltiazem (CARDIZEM CD) 180 MG 24 hr capsule Take 180 mg by mouth daily.     erythromycin ophthalmic ointment Place 1 Application into both eyes in the morning and at bedtime. 14 g 0   Fluticasone-Umeclidin-Vilant 100-62.5-25 MCG/INH AEPB Inhale 1 puff into the lungs daily.     HYDROcodone-acetaminophen (NORCO/VICODIN) 5-325 MG tablet Take 1 tablet by mouth every 8 (eight) hours as needed for severe pain. Must last  30 days 90 tablet 0   HYDROcodone-acetaminophen (NORCO/VICODIN) 5-325 MG tablet Take 1 tablet by mouth every 8 (eight) hours as needed for severe pain. Must last 30 days 90 tablet 0   [START ON 10/01/2022] HYDROcodone-acetaminophen (NORCO/VICODIN) 5-325 MG tablet Take 1 tablet by mouth every 8 (eight) hours as needed for severe pain. Must last 30 days 90 tablet 0   ipratropium-albuterol (DUONEB) 0.5-2.5 (3) MG/3ML SOLN USE 1 VIAL VIA NEBULIZER EVERY 6 HOURS AS NEEDED FOR SHORTNESS OF BREATH OR WHEEZING 1080 mL 0   metolazone (ZAROXOLYN) 2.5 MG tablet Take 1 tablet (2.5 mg total) by mouth every other day. Take 1/2 hour before torsemide 3 tablet 0   naloxone (NARCAN) nasal spray 4 mg/0.1 mL Place 1 spray into the nose as needed for up to 365 doses (for opioid-induced respiratory depresssion). In case of emergency (overdose), spray once into each nostril. If no response within 3 minutes, repeat application and call [phone]. 1 each 0   OXYGEN Inhale 4 L/min into the lungs continuous. 4 L at rest 8 L with exertion  10 L shower     pantoprazole (PROTONIX) 40 MG tablet Take 1 tablet (40 mg total) by mouth daily. (Patient taking differently: Take 40 mg by mouth 2 (two) times daily.) 30 tablet 2   potassium chloride SA (KLOR-CON M) 20 MEQ tablet Take 2 tablets every morning and 1 tablet every evening 270 tablet 3   rivaroxaban (XARELTO) 20 MG TABS tablet Take 1 tablet (20 mg total) by mouth daily with supper.  30 tablet 0   roflumilast (DALIRESP) 500 MCG TABS tablet Take 500 mcg by mouth daily.     sertraline (ZOLOFT) 100 MG tablet TAKE 2 TABLETS BY MOUTH ONCE EVERY MORNING 180 tablet 1   simvastatin (ZOCOR) 20 MG tablet Take 20 mg by mouth at bedtime.     spironolactone (ALDACTONE) 25 MG tablet Take 1 tablet (25 mg total) by mouth daily. (Patient not taking: Reported on 09/20/2022) 30 tablet 5   sucralfate (CARAFATE) 1 g tablet Take 1 g by mouth 4 (four) times daily.     torsemide (DEMADEX) 20 MG tablet Take 20 mg by mouth 2 (two) times daily. 80 mg (4 tablets) in the morning, and 40 mg (2 tablets) in the evening     varenicline (CHANTIX) 1 MG tablet Take by mouth.     No current facility-administered medications for this visit.    Allergies  Allergen Reactions   Gabapentin Other (See Comments)    Higher doses of 800 mg, 600 mg, 300 mg causes excessive sedation and contributes to numerous mechanical falls. (Unsure if 100 mg dose can be tolerated)   Codeine Nausea And Vomiting   Meloxicam Other (See Comments)    Stomach pain      Social History   Socioeconomic History   Marital status: Married    Spouse name: Ovid Curd   Number of children: 2   Years of education: Not on file   Highest education level: 12th grade  Occupational History   Occupation: disability  Tobacco Use   Smoking status: Former    Years: 44.00    Types: Cigarettes   Smokeless tobacco: Never   Tobacco comments:    over a half pack  Vaping Use   Vaping Use: Never used  Substance and Sexual Activity   Alcohol use: No   Drug use: Yes    Comment: prescribed hydrocodone   Sexual activity: Not Currently    Birth control/protection: Surgical  Other Topics Concern   Not on file  Social History Narrative   ** Merged History Encounter **       Lives with spouse    Social Determinants of Health   Financial Resource Strain: Low Risk  (09/20/2021)   Overall Financial Resource Strain (CARDIA)    Difficulty of Paying  Living Expenses: Not very hard  Food Insecurity: No Food Insecurity (12/14/2021)   Hunger Vital Sign    Worried About Running Out of Food in the Last Year: Never true    Ran Out of Food in the Last Year: Never true  Transportation Needs: No Transportation Needs (12/14/2021)   PRAPARE - Hydrologist (Medical): No    Lack of Transportation (Non-Medical): No  Physical Activity: Inactive (09/20/2021)   Exercise Vital Sign    Days of Exercise per Week: 0 days    Minutes of Exercise per Session: 0 min  Stress: No Stress Concern Present (09/20/2021)   [place]    Feeling of Stress : Not at all  Social Connections: Moderately Integrated (09/20/2021)   Social Connection and Isolation Panel [NHANES]    Frequency of Communication with Friends and Family: More than three times a week    Frequency of Social Gatherings with Friends and Family: Once a week    Attends Religious Services: More than 4 times per year    Active Member of [employer] or Organizations: No    Attends Archivist Meetings: Never    Marital Status: Married  Human resources officer Violence: Not At Risk (01/22/2020)   Humiliation, Afraid, Rape, and Kick questionnaire    Fear of Current or Ex-Partner: No    Emotionally Abused: No    Physically Abused: No    Sexually Abused: No      Family History  Problem Relation Age of Onset   Heart disease Mother    Stroke Mother    Coronary artery disease Mother    Lung cancer Sister    Cancer Sister    Breast cancer Sister    Alcohol abuse Father    Heart disease Father    Cancer Brother    Cancer Brother    Pneumonia Brother    Prostate cancer Neg Hx    Kidney cancer Neg Hx    Bladder Cancer Neg Hx     Vitals:   09/29/22 1035  BP: 110/64  Pulse: 87  Resp: 18  SpO2: 100%  Weight: 96.2 kg    PHYSICAL EXAM: General:  Obese on O2No respiratory difficulty HEENT: normal Neck:  supple. Hard to see jvp. Carotids 2+ bilat; no bruits. No lymphadenopathy or thryomegaly appreciated. Cor: PMI nondisplaced. Regular rate & rhythm. No rubs, gallops or murmurs. Lungs: decreased BS throughout + wheeze Abdomen: obese soft, nontender, nondistended. No hepatosplenomegaly. No bruits or masses. Good bowel sounds. Extremities: no cyanosis, clubbing, rash, tr edema Neuro: alert & oriented x 3, cranial nerves grossly intact. moves all 4 extremities w/o difficulty. Affect pleasant.    ASSESSMENT & PLAN:   1. Chronic diastolic HF - Despite elevated ReDS level volume status looks good today - Continue torsemide - Continue SGLT2i and spiro - Limit fluid intake. F/u with Darylene Price, NP  2. Pulmonary HTN - Mild to moderate on cath in 2020 - Cath with high output physiology. No shunts on TEE. No evidence of anomalous PV drainage or other shunts on CT - CT chest 2023 no PE. 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  - No role for selective vasodilators -> stop letairis - Continue O2 and Bipap support - She has been of cigarettes since 9/23.  - Conisder GLP1-RA for weight loss - We discussed repeat RHC but doubt this will change our management  3. COPD with chronic hypoxic respiratory failure - continue O2 and Bipap per Dr. Raul Del - Congratulated her on smoking cessation  4. Morbid obesity  - consider GLP-1RA  Can f/u as needed.   Glori Bickers, MD  11:10 AM

## 2022-09-29 NOTE — Patient Instructions (Signed)
Medication Changes:  STOP ambrisentan (letairis)  Lab Work:  None  Testing/Procedures:  None  Referrals:  None  Special Instructions // Education:  Do the following things EVERYDAY: Weigh yourself in the morning before breakfast. Write it down and keep it in a log. Take your medicines as prescribed Eat low salt foods--Limit salt (sodium) to 2000 mg per day.  Stay as active as you can everyday Limit all fluids for the day to less than 2 liters   Follow-Up in: 1 month with [facility], and as needed for fluid management     If you have any questions or concerns before your next appointment please send [place] a message through [place] or call our office at [phone] Monday-Friday 8 am-5 pm.   If you have an urgent need after hours on the weekend please call your Primary Cardiologist or the [facility] in [place] at [phone].

## 2022-09-29 NOTE — Progress Notes (Signed)
ReDS Vest / Clip - 09/29/22 1035       ReDS Vest / Clip   Station Marker A    Ruler Value 33    ReDS Actual Value 50

## 2022-10-01 ENCOUNTER — Other Ambulatory Visit: Payer: Self-pay | Admitting: Family Medicine

## 2022-10-01 DIAGNOSIS — F411 Generalized anxiety disorder: Secondary | ICD-10-CM

## 2022-10-02 NOTE — Progress Notes (Unsigned)
Patient: Theresa [name]  Service Category: E/M  Provider: Gaspar Cola, MD  DOB: 1956-08-23  DOS: 10/04/2022  Location: Office  MRN: [id number]  Setting: Ambulatory outpatient  Referring Provider: Birdie Sons, MD  Type: Established Patient  Specialty: Interventional Pain Management  PCP: Birdie Sons, MD  Location: Remote location  Delivery: TeleHealth     Virtual Encounter - Pain Management PROVIDER NOTE: Information contained herein reflects review and annotations entered in association with encounter. Interpretation of such information and data should be left to medically-trained personnel. Information provided to patient can be located elsewhere in the medical record under "Patient Instructions". Document created using STT-dictation technology, any transcriptional errors that may result from process are unintentional.    Contact & Pharmacy Preferred: [phone] Home: [phone] (home) Mobile: [phone] (mobile) E-mail: [email or web address]  [place], [place] [place] [address] Phone: [phone] Fax: [phone]   Pre-screening  Ms. Quevedo offered "in-person" vs "virtual" encounter. She indicated preferring virtual for this encounter.   Reason COVID-19*  Social distancing based on CDC and AMA recommendations.   I contacted [name] on 10/04/2022 via telephone.      I clearly identified myself as Gaspar Cola, MD. I verified that I was speaking with the correct person using two identifiers (Name: Cesia Orf, and date of birth: 02-Aug-1956).  Consent I sought verbal advanced consent from [name] for virtual visit interactions. I informed Ms. Fishel of possible security and privacy concerns, risks, and limitations associated with providing "not-in-person" medical evaluation and management services. I also informed Ms. Tunison of the availability of "in-person" appointments. Finally, I informed her  that there would be a charge for the virtual visit and that she could be  personally, fully or partially, financially responsible for it. Ms. Pfluger expressed understanding and agreed to proceed.   Historic Elements   Ms. Illeana Edick is a 66 y.o. year old, female patient evaluated today after our last contact on 09/20/2022. Ms. Jedlicka  has a past medical history of Acute postoperative pain (10/03/2017), Anxiety, BiPAP (biphasic positive airway pressure) dependence, Carpal tunnel syndrome, CHF (congestive heart failure) ([place]), CHF (congestive heart failure) ([place]), Chronic generalized abdominal pain, Chronic rhinitis, COPD (chronic obstructive pulmonary disease) ([place]), DDD (degenerative disc disease), cervical, DDD (degenerative disc disease), lumbosacral, Depression, Dyspnea, Edema, Flu, Gastritis, GERD (gastroesophageal reflux disease), Hematuria, Hernia of abdominal wall (07/17/2018), Hernia, abdominal, Hypertension, Kidney stones, Low back pain, Lumbar radiculitis, Malodorous urine, Muscle weakness, Obesity, Oxygen dependent, Pneumonia due to COVID-19 virus (02/03/2020), Renal cyst, Sensory urge incontinence, Thyroid activity decreased, Tobacco abuse, and Wheezing. She also  has a past surgical history that includes Abdominal hysterectomy; Tonsillectomy; Carpal tunnel release (Left, 2012); Tubal ligation; epigastric hernia repair (N/A, 08/13/2018); Umbilical hernia repair (N/A, 08/13/2018); RIGHT/LEFT HEART CATH AND CORONARY ANGIOGRAPHY (N/A, 07/11/2019); TEE without cardioversion (N/A, 04/29/2022); Cardioversion (N/A, 04/29/2022); and Esophagogastroduodenoscopy (N/A, 06/21/2022). Ms. Brame has a current medication list which includes the following prescription(s): acetaminophen, albuterol, ambrisentan, amiodarone, bupropion, buspirone, butalbital-acetaminophen-caffeine, cholecalciferol, dapagliflozin propanediol, diclofenac sodium, diltiazem, erythromycin, fluticasone-umeclidin-vilant,  hydrocodone-acetaminophen, ipratropium-albuterol, metolazone, naloxone, oxygen-helium, pantoprazole, potassium chloride sa, rivaroxaban, roflumilast, simvastatin, sucralfate, torsemide, varenicline, hydrocodone-acetaminophen, hydrocodone-acetaminophen, and sertraline. She  reports that she has quit smoking. Her smoking use included cigarettes. She has never used smokeless tobacco. She reports current drug use. She reports that she does not drink alcohol. Ms. Stang is allergic to gabapentin, codeine, and meloxicam.  Estimated body mass index is 38.78 kg/m as calculated from  the following:   Height as of 09/20/22: _0  (1.575 m).   Weight as of 09/29/22: 212 lb (96.2 kg).  HPI  Today, she is being contacted for a post-procedure assessment.  The patient refers having attained 100% relief of the pain that lasted for approximately 5 days.  Unfortunately, the pain started to slowly come back and its back to baseline.  We had previously talked about a radiofrequency ablation and I have informed the patient that we will go ahead and try to do this for her but she needs to work on bringing her BMI down.  She has lost a lot of weight, but unfortunately she started gaining a again that it is this gain that has again flared up the facet joint pain.  Today I spoke to the patient about this and the plan to repeat radiofrequency and she is in agreement.  The last time that we did this for her to provide her with excellent relief of the pain that lasted for quite some time unfortunately the facet radiofrequency used to wear off.  Last time that we treated her with the radiofrequency was in 2018.  At that time, we did the left side first and initially she reported some relief of the pain but the relief was not complete or significant until we went ahead and did the radiofrequency on the right side as well.  Today she refers her right side to be worse, but the left side closely follows.  Post-procedure evaluation   Type:  Lumbar Facet, Medial Branch Block(s)  #7   Laterality: Right  Level: L2, L3, L4, L5, & S1 Medial Branch Level(s). Injecting these levels blocks the L3-4, L4-5 and L5-S1 lumbar facet joints. Imaging: Fluoroscopic guidance         Anesthesia: Local anesthesia (1-2% Lidocaine) Anxiolysis: IV Versed         Sedation: No Sedation                       DOS: 09/20/2022 Performed by: Gaspar Cola, MD  Primary Purpose: Diagnostic/Therapeutic Indications: Low back pain severe enough to impact quality of life or function. 1. Lumbar facet syndrome (Bilateral) (R>L)   2. Spondylosis without myelopathy or radiculopathy, lumbosacral region   3. Lumbar facet hypertrophy (multilevel)   4. DDD (degenerative disc disease), lumbar   5. Chronic low back pain (1ry area of Pain) (Bilateral) (R>L) w/o sciatica   6. Chronic anticoagulation (XARELTO)    NAS-11 Pain score:   Pre-procedure: 9 /10   Post-procedure: 6 /10      Effectiveness:  Initial hour after procedure: 100 %. Subsequent 4-6 hours post-procedure: 100 %. Analgesia past initial 6 hours: 100 % (lasting 5 days). Ongoing improvement:  Analgesic: 100% relief of the pain x 5 days after which the pain went back to baseline. Function: Back to baseline.  Patient indicated significant improvement until the pain started to come back. ROM: Back to baseline.  Patient reported significant improvement until the pain started to come back.  Pharmacotherapy Assessment   Opioid Analgesic: Hydrocodone/APAP 5/325 one tablet every 8 hours (15 mg/day of hydrocodone) MME/day: 15 mg/day.   Monitoring: [facility] [place] PMP: PDMP reviewed during this encounter.       Pharmacotherapy: No side-effects or adverse reactions reported. Compliance: No problems identified. Effectiveness: Clinically acceptable. Plan: Refer to "POC". UDS:  Summary  Date Value Ref Range Status  10/27/2021 Note  Final    Comment:     ==================================================================== [employer]  Select 13 (MW) ==================================================================== Test                             Result       Flag       Units  Drug Present and Declared for Prescription Verification   Hydrocodone                    653          EXPECTED   ng/mg creat   Hydromorphone                  76           EXPECTED   ng/mg creat   Dihydrocodeine                 125          EXPECTED   ng/mg creat   Norhydrocodone                 625          EXPECTED   ng/mg creat    Sources of hydrocodone include scheduled prescription medications.    Hydromorphone, dihydrocodeine and norhydrocodone are expected    metabolites of hydrocodone. Hydromorphone and dihydrocodeine are    also available as scheduled prescription medications.  ==================================================================== Test                      Result    Flag   Units      Ref Range   Creatinine              72               mg/dL      >=20 ==================================================================== Declared Medications:  The flagging and interpretation on this report are based on the  following declared medications.  Unexpected results may arise from  inaccuracies in the declared medications.   **Note: The testing scope of this panel includes these medications:   Hydrocodone (Norco)   **Note: The testing scope of this panel does not include the  following reported medications:   Acetaminophen (Norco)  Albuterol (Ventolin HFA)  Albuterol (Duoneb)  Aspirin  Cholecalciferol  Fluticasone  Ipratropium (Duoneb)  Levothyroxine (Synthroid)  Potassium (Klor-Con)  Roflumilast (Daliresp)  Sertraline (Zoloft)  Simvastatin (Zocor)  Torsemide (Demadex)  Umeclidinium  Vilanterol ==================================================================== For clinical consultation, please call [phone]. ====================================================================    No results found for: "CBDTHCR", "D8THCCBX", "D9THCCBX"   Laboratory Chemistry Profile   Renal Lab Results  Component Value Date   BUN 31 (H) 09/21/2022   CREATININE 1.16 (H) 09/21/2022   BCR 32 (H) 04/14/2022   GFRAA >60 06/26/2020   GFRNONAA 52 (L) 09/21/2022    Hepatic Lab Results  Component Value Date   AST 21 06/19/2022   ALT 18 06/19/2022   ALBUMIN 3.9 06/19/2022   ALKPHOS 73 06/19/2022   LIPASE 57 (H) 06/19/2022   AMMONIA 40 (H) 10/11/2019    Electrolytes Lab Results  Component Value Date   NA 139 09/21/2022   K 4.7 09/21/2022   CL 103 09/21/2022   CALCIUM 9.3 09/21/2022   MG 2.3 06/21/2022   PHOS 3.2 06/21/2022    Bone Lab Results  Component Value Date   25OHVITD1 13 (L) 09/17/2015   25OHVITD2 <1.0 09/17/2015   25OHVITD3 13 09/17/2015    Inflammation (CRP: Acute Phase) (ESR: Chronic Phase) Lab  Results  Component Value Date   CRP 0.9 04/02/2022   ESRSEDRATE 17 04/06/2022   LATICACIDVEN 0.9 04/01/2022         Note: Above Lab results reviewed.  Imaging  DG PAIN CLINIC C-ARM 1-60 MIN NO REPORT Fluoro was used, but no Radiologist interpretation will be provided.  Please refer to "NOTES" tab for provider progress note.  Assessment  The primary encounter diagnosis was Chronic low back pain (1ry area of Pain) (Bilateral) (R>L) w/o sciatica. Diagnoses of Lumbar facet syndrome (Bilateral) (R>L), Chronic hip pain (3ry area of Pain) (Bilateral) (L>R), Chronic lower extremity pain (Bilateral) (R>L), Chronic knee pain (Left), Spondylosis without myelopathy or radiculopathy, lumbosacral region, Lumbar facet hypertrophy (multilevel), DDD (degenerative disc disease), lumbar, Chronic pain syndrome (significant psychosocial component), and Chronic anticoagulation (XARELTO) were also pertinent to this visit.  Plan of Care  Problem-specific:  No problem-specific Assessment & Plan notes  found for this encounter.  Ms. Ahmoni Edge has a current medication list which includes the following long-term medication(s): albuterol, amiodarone, hydrocodone-acetaminophen, ipratropium-albuterol, metolazone, pantoprazole, potassium chloride sa, rivaroxaban, hydrocodone-acetaminophen, hydrocodone-acetaminophen, and sertraline.  Pharmacotherapy (Medications Ordered): No orders of the defined types were placed in this encounter.  Orders:  Orders Placed This Encounter  Procedures   Radiofrequency,Lumbar    Standing Status:   Future    Standing Expiration Date:   01/03/2023    Scheduling Instructions:     Side(s): Right-sided     Level: L4-5 and L5-S1 Facets (L3, L4, L5, and S1 Medial Branch)     Sedation: Patient's choice.     Scheduling Timeframe: As soon as pre-approved    Order Specific Question:   Where will this procedure be performed?    Answer:   [facility]   Blood Thinner Instructions to Nursing    Always make sure patient has clearance from prescribing physician to stop blood thinners for interventional therapies. If the patient requires a Lovenox-bridge therapy, make sure arrangements are made to institute it with the assistance of the PCP.    Scheduling Instructions:     Have Ms. Pennix stop the Xarelto (Rivaroxaban) x 3 days prior to procedure or surgery.   Follow-up plan:   Return for (27mn), (ECT): (R) L-FCT RFA #2, (Blood Thinner Protocol).     Interventional Therapies  Risk  Complexity Considerations:   NOTE: NO further Lumbar RFA until BMI <35.   Planned  Pending:   Therapeutic right lumbar facet RFA #2 (last done on 10/03/2017)    Under consideration:   Possible left lumbar facet RFA #2 (last done on 2018)  Diagnostic bilateral cervical facet MBB #1  Palliative left L2-3 LESI #4    Completed:   Palliative right lumbar facet MBB x7 (09/20/2022) (100/100/100x5days/0)  Palliative left lumbar facet MBB x6 (11/11/2021) (100/100/0/0)  Therapeutic  left L2-3 LESI x3 (09/23/2021) (80/80/80)  Diagnostic/palliative left L4 TFESI x1 (12/26/2017) (100/100/50/>50)  Diagnostic/palliative left L4-5 LESI x2 (07/30/2019) (100/100/0/0)  Diagnostic/palliative right L4 TFESI x1 (01/25/2018) (100/100/50/50)  Diagnostic/palliative right L4-5 LESI x1 (01/25/2018) (100/100/50/50)  Palliative left lumbar facet RFA x1 (06/27/2017) (0/0/100/>50)  Palliative right lumbar facet RFA x1 (10/03/2017) (100/100/75)    Therapeutic  Palliative (PRN) options:   Therapeutic lumbar facet MBB   Therapeutic left L2 LESI  Therapeutic lumbar facet RFA        Recent Visits Date Type Provider Dept  09/20/22 Procedure visit NMilinda Pointer MD [facility]  08/03/22 Office Visit NMilinda Pointer MD [facility]  Showing recent visits  within past 90 days and meeting all other requirements Today's Visits Date Type Provider Dept  10/04/22 Office Visit Milinda Pointer, MD [facility]  Showing today's visits and meeting all other requirements Future Appointments Date Type Provider Dept  10/26/22 Appointment Milinda Pointer, MD [facility]  Showing future appointments within next 90 days and meeting all other requirements  I discussed the assessment and treatment plan with the patient. The patient was provided an opportunity to ask questions and all were answered. The patient agreed with the plan and demonstrated an understanding of the instructions.  Patient advised to call back or seek an in-person evaluation if the symptoms or condition worsens.  Duration of encounter: 15 minutes.  Note by: Gaspar Cola, MD Date: 10/04/2022; Time: 11:18 AM

## 2022-10-03 DIAGNOSIS — Z9981 Dependence on supplemental oxygen: Secondary | ICD-10-CM | POA: Diagnosis not present

## 2022-10-03 DIAGNOSIS — R0602 Shortness of breath: Secondary | ICD-10-CM | POA: Diagnosis not present

## 2022-10-03 DIAGNOSIS — J449 Chronic obstructive pulmonary disease, unspecified: Secondary | ICD-10-CM | POA: Diagnosis not present

## 2022-10-03 DIAGNOSIS — I272 Pulmonary hypertension, unspecified: Secondary | ICD-10-CM | POA: Diagnosis not present

## 2022-10-03 NOTE — Telephone Encounter (Signed)
Patient called, left VM to return the call to the office to scheduled an appt for medication refill request.

## 2022-10-03 NOTE — Telephone Encounter (Signed)
Requested Prescriptions  Pending Prescriptions Disp Refills   sertraline (ZOLOFT) 100 MG tablet [Pharmacy Med Name: SERTRALINE HCL 100 MG TAB] 180 tablet 0    Sig: TAKE 2 TABLETS BY MOUTH ONCE EVERY MORNING     Psychiatry:  Antidepressants - SSRI - sertraline Passed - 10/01/2022  1:54 PM      Passed - AST in normal range and within 360 days    AST  Date Value Ref Range Status  06/19/2022 21 15 - 41 U/L Final   SGOT(AST)  Date Value Ref Range Status  06/12/2014 14 (L) 15 - 37 Unit/L Final         Passed - ALT in normal range and within 360 days    ALT  Date Value Ref Range Status  06/19/2022 18 0 - 44 U/L Final   SGPT (ALT)  Date Value Ref Range Status  06/12/2014 35 U/L Final    Comment:    14-63 NOTE: New Reference Range 05/06/14          Passed - Completed PHQ-2 or PHQ-9 in the last 360 days      Passed - Valid encounter within last 6 months    Recent Outpatient Visits           1 month ago Chronic bacterial conjunctivitis of both eyes   [facility] [name], FNP   5 months ago Hospital discharge follow-up   [facility] Tally Joe T, FNP   5 months ago COPD exacerbation [facility])   [facility] [place], [place], PA-C   1 year ago Need for immunization against influenza   [facility] Birdie Sons, MD   1 year ago COPD exacerbation [facility])   [facility] Birdie Sons, MD

## 2022-10-04 ENCOUNTER — Ambulatory Visit: Payer: Medicare HMO | Attending: Pain Medicine | Admitting: Pain Medicine

## 2022-10-04 DIAGNOSIS — M79604 Pain in right leg: Secondary | ICD-10-CM | POA: Diagnosis not present

## 2022-10-04 DIAGNOSIS — M5136 Other intervertebral disc degeneration, lumbar region: Secondary | ICD-10-CM

## 2022-10-04 DIAGNOSIS — M47817 Spondylosis without myelopathy or radiculopathy, lumbosacral region: Secondary | ICD-10-CM

## 2022-10-04 DIAGNOSIS — M25562 Pain in left knee: Secondary | ICD-10-CM

## 2022-10-04 DIAGNOSIS — M545 Low back pain, unspecified: Secondary | ICD-10-CM | POA: Diagnosis not present

## 2022-10-04 DIAGNOSIS — M47816 Spondylosis without myelopathy or radiculopathy, lumbar region: Secondary | ICD-10-CM

## 2022-10-04 DIAGNOSIS — M79605 Pain in left leg: Secondary | ICD-10-CM

## 2022-10-04 DIAGNOSIS — Z7901 Long term (current) use of anticoagulants: Secondary | ICD-10-CM | POA: Diagnosis not present

## 2022-10-04 DIAGNOSIS — G8929 Other chronic pain: Secondary | ICD-10-CM

## 2022-10-04 DIAGNOSIS — G894 Chronic pain syndrome: Secondary | ICD-10-CM | POA: Diagnosis not present

## 2022-10-04 NOTE — Patient Instructions (Addendum)
______________________________________________________________________  Preparing for your procedure  During your procedure appointment there will be: No Prescription Refills. No disability issues to discussed. No medication changes or discussions.  Instructions: Food intake: Avoid eating anything solid for at least 8 hours prior to your procedure. Clear liquid intake: You may take clear liquids such as water up to 2 hours prior to your procedure. (No carbonated drinks. No soda.) Transportation: Unless otherwise stated by your physician, bring a driver. Morning Medicines: Except for blood thinners, take all of your other morning medications with a sip of water. Make sure to take your heart and blood pressure medicines. If your blood pressure's lower number is above 100, the case will be rescheduled. Blood thinners: If you take a blood thinner, but were not instructed to stop it, call our office [phone] and ask to talk to a nurse. Not stopping a blood thinner prior to certain procedures could lead to serious complications. Diabetics on insulin: Notify the staff so that you can be scheduled 1st case in the morning. If your diabetes requires high dose insulin, take only  of your normal insulin dose the morning of the procedure and notify the staff that you have done so. Preventing infections: Shower with an antibacterial soap the morning of your procedure.  Build-up your immune system: Take 1000 mg of Vitamin C with every meal (3 times a day) the day prior to your procedure. Antibiotics: Inform the nursing staff if you are taking any antibiotics or if you have any conditions that may require antibiotics prior to procedures. (Example: recent joint implants)   Pregnancy: If you are pregnant make sure to notify the nursing staff. Not doing so may result in injury to the fetus, including death.  Sickness: If you have a cold, fever, or any active infections, call and cancel or reschedule your  procedure. Receiving steroids while having an infection may result in complications. Arrival: You must be in the facility at least 30 minutes prior to your scheduled procedure. Tardiness: Your scheduled time is also the cutoff time. If you do not arrive at least 15 minutes prior to your procedure, you will be rescheduled.  Children: Do not bring any children with you. Make arrangements to keep them home. Dress appropriately: There is always a possibility that your clothing may get soiled. Avoid long dresses. Valuables: Do not bring any jewelry or valuables.  Reasons to call and reschedule or cancel your procedure: (Following these recommendations will minimize the risk of a serious complication.) Surgeries: Avoid having procedures within 2 weeks of any surgery. (Avoid for 2 weeks before or after any surgery). Flu Shots: Avoid having procedures within 2 weeks of a flu shots or . (Avoid for 2 weeks before or after immunizations). Barium: Avoid having a procedure within 7-10 days after having had a radiological study involving the use of radiological contrast. (Myelograms, Barium swallow or enema study). Heart attacks: Avoid any elective procedures or surgeries for the initial 6 months after a "Myocardial Infarction" (Heart Attack). Blood thinners: It is imperative that you stop these medications before procedures. Let us know if you if you take any blood thinner.  Infection: Avoid procedures during or within two weeks of an infection (including chest colds or gastrointestinal problems). Symptoms associated with infections include: Localized redness, fever, chills, night sweats or profuse sweating, burning sensation when voiding, cough, congestion, stuffiness, runny nose, sore throat, diarrhea, nausea, vomiting, cold or Flu symptoms, recent or current infections. It is specially important if the infection is  over the area that we intend to treat. Heart and lung problems: Symptoms that may suggest an  active cardiopulmonary problem include: cough, chest pain, breathing difficulties or shortness of breath, dizziness, ankle swelling, uncontrolled high or unusually low blood pressure, and/or palpitations. If you are experiencing any of these symptoms, cancel your procedure and contact your primary care physician for an evaluation.  Remember:  Regular Business hours are:  Monday to Thursday 8:00 AM to 4:00 PM  Provider's Schedule: Milinda Pointer, MD:  Procedure days: Tuesday and Thursday 7:30 AM to 4:00 PM  Gillis Santa, MD:  Procedure days: Monday and Wednesday 7:30 AM to 4:00 PM  ______________________________________________________________________    ____________________________________________________________________________________________  General Risks and Possible Complications  Patient Responsibilities: It is important that you read this as it is part of your informed consent. It is our duty to inform you of the risks and possible complications associated with treatments offered to you. It is your responsibility as a patient to read this and to ask questions about anything that is not clear or that you believe was not covered in this document.  Patient's Rights: You have the right to refuse treatment. You also have the right to change your mind, even after initially having agreed to have the treatment done. However, under this last option, if you wait until the last second to change your mind, you may be charged for the materials used up to that point.  Introduction: Medicine is not an Chief Strategy Officer. Everything in Medicine, including the lack of treatment(s), carries the potential for danger, harm, or loss (which is by definition: Risk). In Medicine, a complication is a secondary problem, condition, or disease that can aggravate an already existing one. All treatments carry the risk of possible complications. The fact that a side effects or complications occurs, does not imply  that the treatment was conducted incorrectly. It must be clearly understood that these can happen even when everything is done following the highest safety standards.  No treatment: You can choose not to proceed with the proposed treatment alternative. The "PRO(s)" would include: avoiding the risk of complications associated with the therapy. The "CON(s)" would include: not getting any of the treatment benefits. These benefits fall under one of three categories: diagnostic; therapeutic; and/or palliative. Diagnostic benefits include: getting information which can ultimately lead to improvement of the disease or symptom(s). Therapeutic benefits are those associated with the successful treatment of the disease. Finally, palliative benefits are those related to the decrease of the primary symptoms, without necessarily curing the condition (example: decreasing the pain from a flare-up of a chronic condition, such as incurable terminal cancer).  General Risks and Complications: These are associated to most interventional treatments. They can occur alone, or in combination. They fall under one of the following six (6) categories: no benefit or worsening of symptoms; bleeding; infection; nerve damage; allergic reactions; and/or death. No benefits or worsening of symptoms: In Medicine there are no guarantees, only probabilities. No healthcare provider can ever guarantee that a medical treatment will work, they can only state the probability that it may. Furthermore, there is always the possibility that the condition may worsen, either directly, or indirectly, as a consequence of the treatment. Bleeding: This is more common if the patient is taking a blood thinner, either prescription or over the counter (example: Goody Powders, Fish oil, Aspirin, Garlic, etc.), or if suffering a condition associated with impaired coagulation (example: [name], cirrhosis of the liver, low platelet counts, etc.). However, even if  you  do not have one on these, it can still happen. If you have any of these conditions, or take one of these drugs, make sure to notify your treating physician. Infection: This is more common in patients with a compromised immune system, either due to disease (example: diabetes, cancer, human immunodeficiency virus [HIV], etc.), or due to medications or treatments (example: therapies used to treat cancer and rheumatological diseases). However, even if you do not have one on these, it can still happen. If you have any of these conditions, or take one of these drugs, make sure to notify your treating physician. Nerve Damage: This is more common when the treatment is an invasive one, but it can also happen with the use of medications, such as those used in the treatment of cancer. The damage can occur to small secondary nerves, or to large primary ones, such as those in the spinal cord and brain. This damage may be temporary or permanent and it may lead to impairments that can range from temporary numbness to permanent paralysis and/or brain death. Allergic Reactions: Any time a substance or material comes in contact with our body, there is the possibility of an allergic reaction. These can range from a mild skin rash (contact dermatitis) to a severe systemic reaction (anaphylactic reaction), which can result in death. Death: In general, any medical intervention can result in death, most of the time due to an unforeseen complication. ____________________________________________________________________________________________    ____________________________________________________________________________________________  Blood Thinners  IMPORTANT NOTICE:  If you take any of these, make sure to notify the nursing staff.  Failure to do so may result in injury.  Recommended time intervals to stop and restart blood-thinners, before & after invasive procedures  Generic Name Brand Name Pre-procedure. Stop this  long before procedure. Post-procedure. Minimum waiting period before restarting.  Abciximab Reopro 15 days 2 hrs  Alteplase Activase 10 days 10 days  Anagrelide Agrylin    Apixaban Eliquis 3 days 6 hrs  Cilostazol Pletal 3 days 5 hrs  Clopidogrel Plavix 7-10 days 2 hrs  Dabigatran Pradaxa 5 days 6 hrs  Dalteparin Fragmin 24 hours 4 hrs  Dipyridamole Aggrenox 11days 2 hrs  Edoxaban Lixiana; Savaysa 3 days 2 hrs  Enoxaparin  Lovenox 24 hours 4 hrs  Eptifibatide Integrillin 8 hours 2 hrs  Fondaparinux  Arixtra 72 hours 12 hrs  Hydroxychloroquine Plaquenil 11 days   Prasugrel Effient 7-10 days 6 hrs  Reteplase Retavase 10 days 10 days  Rivaroxaban Xarelto 3 days 6 hrs  Ticagrelor Brilinta 5-7 days 6 hrs  Ticlopidine Ticlid 10-14 days 2 hrs  Tinzaparin Innohep 24 hours 4 hrs  Tirofiban Aggrastat 8 hours 2 hrs  Warfarin Coumadin 5 days 2 hrs   Other medications with blood-thinning effects  Product indications Generic (Brand) names Note  Cholesterol Lipitor Stop 4 days before procedure  Blood thinner (injectable) Heparin (LMW or LMWH Heparin) Stop 24 hours before procedure  Cancer Ibrutinib (Imbruvica) Stop 7 days before procedure  Malaria/Rheumatoid Hydroxychloroquine (Plaquenil) Stop 11 days before procedure  Thrombolytics  10 days before or after procedures   Over-the-counter (OTC) Products with blood-thinning effects  Product Common names Stop Time  Aspirin > 325 mg Goody Powders, Excedrin, etc. 11 days  Aspirin ? 81 mg  7 days  Fish oil  4 days  Garlic supplements  7 days  Ginkgo biloba  36 hours  Ginseng  24 hours  NSAIDs Ibuprofen, Naprosyn, etc. 3 days  Vitamin E  4 days  ____________________________________________________________________________________________    ____________________________________________________________________________________________  Patient Information update  To: All of our patients.  Re: Name change.  It has been made official that  our current name, "[facility]"   will soon be changed to "[facility]".   The purpose of this change is to eliminate any confusion created by the concept of our practice being a "Medication Management Pain Clinic". In the past this has led to the misconception that we treat pain primarily by the use of prescription medications.  Nothing can be farther from the truth.   Understanding PAIN MANAGEMENT: To further understand what our practice does, you first have to understand that "Pain Management" is a subspecialty that requires additional training once a physician has completed their specialty training, which can be in either Anesthesia, Neurology, Psychiatry, or Physical Medicine and Rehabilitation (PMR). Each one of these contributes to the final approach taken by each physician to the management of their patient's pain. To be a "Pain Management Specialist" you must have first completed one of the specialty trainings below.  Anesthesiologists - trained in clinical pharmacology and interventional techniques such as nerve blockade and regional as well as central neuroanatomy. They are trained to block pain before, during, and after surgical interventions.  Neurologists - trained in the diagnosis and pharmacological treatment of complex neurological conditions, such as Multiple Sclerosis, Parkinson's, spinal cord injuries, and other systemic conditions that may be associated with symptoms that may include but are not limited to pain. They tend to rely primarily on the treatment of chronic pain using prescription medications.  Psychiatrist - trained in conditions affecting the psychosocial wellbeing of patients including but not limited to depression, anxiety, schizophrenia, personality disorders, addiction, and other substance use disorders that may be associated with chronic pain. They tend to rely  primarily on the treatment of chronic pain using prescription medications.   Physical Medicine and Rehabilitation (PMR) physicians, also known as physiatrists - trained to treat a wide variety of medical conditions affecting the brain, spinal cord, nerves, bones, joints, ligaments, muscles, and tendons. Their training is primarily aimed at treating patients that have suffered injuries that have caused severe physical impairment. Their training is primarily aimed at the physical therapy and rehabilitation of those patients. They may also work alongside orthopedic surgeons or neurosurgeons using their expertise in assisting surgical patients to recover after their surgeries.  INTERVENTIONAL PAIN MANAGEMENT is sub-subspecialty of Pain Management.  Our physicians are Board-certified in Anesthesia, Pain Management, and Interventional Pain Management.  This meaning that not only have they been trained and Board-certified in their specialty of Anesthesia, and subspecialty of Pain Management, but they have also received further training in the sub-subspecialty of Interventional Pain Management, in order to become Board-certified as INTERVENTIONAL PAIN MANAGEMENT SPECIALIST.    Mission: Our goal is to use our skills in  [facility] as alternatives to the chronic use of prescription opioid medications for the treatment of pain. To make this more clear, we have changed our name to reflect what we do and offer. We will continue to offer medication management assessment and recommendations, but we will not be taking over any patient's medication management.  ____________________________________________________________________________________________     ____________________________________________________________________________________________  National Pain Medication Shortage  The [place] is experiencing worsening drug shortages. These have had a negative widespread effect on patient care and  treatment. Not expected to improve any time soon. Predicted to last past 2029.  Drug shortage list (generic names) Oxycodone IR Oxycodone/APAP Oxymorphone IR Hydromorphone Hydrocodone/APAP Morphine  Where is the problem?  Manufacturing and supply level.  Will this shortage affect you?  Only if you take any of the above pain medications.  How? You may be unable to fill your prescription.  Your pharmacist may offer a "partial fill" of your prescription. (Warning: Do not accept partial fills.) Prescriptions partially filled cannot be transferred to another pharmacy. Read our Medication Rules and Regulation. Depending on how much medicine you are dependent on, you may experience withdrawals when unable to get the medication.  Recommendations: Consider ending your dependence on opioid pain medications. Ask your pain specialist to assist you with the process. Consider switching to a medication currently not in shortage, such as Buprenorphine. Talk to your pain specialist about this option. Consider decreasing your pain medication requirements by managing tolerance thru "Drug Holidays". This may help minimize withdrawals, should you run out of medicine. Control your pain thru the use of non-pharmacological interventional therapies.   Your prescriber: Prescribers cannot be blamed for shortages. Medication manufacturing and supply issues cannot be fixed by the prescriber.   NOTE: The prescriber is not responsible for supplying the medication, or solving supply issues. Work with your pharmacist to solve it. The patient is responsible for the decision to take or continue taking the medication and for identifying and securing a legal supply source. By law, supplying the medication is the job and responsibility of the pharmacy. The prescriber is responsible for the evaluation, monitoring, and prescribing of these medications.   Prescribers will NOT: Re-issue prescriptions that have been  partially filled. Re-issue prescriptions already sent to a pharmacy.  Re-send prescriptions to a different pharmacy because yours did not have your medication. Ask pharmacist to order more medicine or transfer the prescription to another pharmacy. (Read below.)  New 2023 regulation: "June 17, 2022 Revised Regulation Allows DEA-Registered Pharmacies to Transfer Electronic Prescriptions at a Patient's Request [place] Patients now have the ability to request their electronic prescription be transferred to another pharmacy without having to go back to their practitioner to initiate the request. This revised regulation went into effect on Monday, June 13, 2022.     At a patient's request, a DEA-registered retail pharmacy can now transfer an electronic prescription for a controlled substance (schedules II-V) to another DEA-registered retail pharmacy. Prior to this change, patients would have to go through their practitioner to cancel their prescription and have it re-issued to a different pharmacy. The process was taxing and time consuming for both patients and practitioners.    The Drug Enforcement Administration [facility]) published its intent to revise the process for transferring electronic prescriptions on September 04, 2020.  The final rule was published in the federal register on May 12, 2022 and went into effect 30 days later.  Under the final rule, a prescription can only be transferred once between pharmacies, and only if allowed under existing state or other applicable law. The prescription must remain in its electronic form; may not be altered in any way; and the transfer must be communicated directly between two licensed pharmacists. It's important to note, any authorized refills transfer with the original prescription, which means the entire prescription will be filled at the same pharmacy".   Reference: [employer] [facility] website announcement)  [email or web address] ([place])   [employer] / Vol. 88, No. 143 / Thursday, May 12, 2022 / Rules and Regulations DEPARTMENT OF  JUSTICE  Drug Enforcement Administration  21 CFR Part 0092  [Docket No. [id number]]  RIN [id number] Transfer of Electronic Prescriptions for Schedules II-V Controlled Substances Between Pharmacies for Initial Filling  ____________________________________________________________________________________________     ____________________________________________________________________________________________  Drug Holidays  What is a "Drug Holiday"? Drug Holiday: is the name given to the process of slowly tapering down and temporarily stopping the pain medication for the purpose of decreasing or eliminating tolerance to the drug.  Benefits Improved effectiveness Decreased required effective dose Improved pain control End dependence on high dose therapy Decrease cost of therapy Uncovering "opioid-induced hyperalgesia". (OIH)  What is "opioid hyperalgesia"? It is a paradoxical increase in pain caused by exposure to opioids. Stopping the opioid pain medication, contrary to the expected, it actually decreases or completely eliminates the pain. Ref.: "A comprehensive review of opioid-induced hyperalgesia". Brion Aliment, et.al. Pain Physician. 2011 Mar-Apr;14(2):145-61.  What is tolerance? Tolerance: the progressive loss of effectiveness of a pain medicine due to repetitive use. A common problem of opioid pain medications.  How long should a "Drug Holiday" last? Effectiveness depends on the patient staying off all opioid pain medicines for a minimum of 14 consecutive days. (2 weeks)  How about just taking less of the  medicine? Does not work. Will not accomplish goal of eliminating the excess receptors.  How about switching to a different pain medicine? (AKA. "Opioid rotation") Does not work. Creates the illusion of effectiveness by taking advantage of inaccurate equivalent dose calculations between different opioids. -This "technique" was promoted by studies funded by [employer], such as [employer], creators of "OxyContin".  Can I stop the medicine "cold [place]"? Depends. You should always coordinate with your Pain Specialist to make the transition as smoothly as possible. Avoid stopping the medicine abruptly without consulting. We recommend a "slow taper".  What is a slow taper? Taper: refers to the gradual decrease in dose.   How do I stop/taper the dose? Slowly. Decrease the daily amount of pills that you take by one (1) pill every seven (7) days. This is called a "slow downward taper". Example: if you normally take four (4) pills per day, drop it to three (3) pills per day for seven (7) days, then to two (2) pills per day for seven (7) days, then to one (1) per day for seven (7) days, and then stop the medicine. The 14 day "Drug Holiday" starts on the first day without medicine.   Will I experience withdrawals? Unlikely with a slow taper.  What triggers withdrawals? Withdrawals are triggered by the sudden/abrupt stop of high dose opioids. Withdrawals can be avoided by slowly decreasing the dose over a prolonged period of time.  What are withdrawals? Symptoms associated with sudden/abrupt reduction/stopping of high-dose, long-term use of pain medication. Withdrawal are seldom seen on low dose therapy, or patients rarely taking opioid medication.  Early Withdrawal Symptoms may include: Agitation Anxiety Muscle aches Increased tearing Insomnia Runny nose Sweating Yawning  Late symptoms may include: Abdominal cramping Diarrhea Dilated pupils Goose  bumps Nausea Vomiting  (Last update: 09/25/2022) ____________________________________________________________________________________________

## 2022-10-05 ENCOUNTER — Inpatient Hospital Stay: Payer: Medicare HMO | Attending: Oncology | Admitting: Oncology

## 2022-10-05 ENCOUNTER — Encounter: Payer: Self-pay | Admitting: Oncology

## 2022-10-05 ENCOUNTER — Telehealth: Payer: Self-pay

## 2022-10-05 ENCOUNTER — Inpatient Hospital Stay: Payer: Medicare HMO

## 2022-10-05 VITALS — BP 118/72 | HR 77 | Temp 98.7°F | Resp 18 | Wt 208.9 lb

## 2022-10-05 DIAGNOSIS — D509 Iron deficiency anemia, unspecified: Secondary | ICD-10-CM | POA: Insufficient documentation

## 2022-10-05 DIAGNOSIS — Z8249 Family history of ischemic heart disease and other diseases of the circulatory system: Secondary | ICD-10-CM | POA: Insufficient documentation

## 2022-10-05 DIAGNOSIS — Z823 Family history of stroke: Secondary | ICD-10-CM | POA: Diagnosis not present

## 2022-10-05 DIAGNOSIS — I272 Pulmonary hypertension, unspecified: Secondary | ICD-10-CM | POA: Diagnosis not present

## 2022-10-05 DIAGNOSIS — K219 Gastro-esophageal reflux disease without esophagitis: Secondary | ICD-10-CM | POA: Insufficient documentation

## 2022-10-05 DIAGNOSIS — D649 Anemia, unspecified: Secondary | ICD-10-CM

## 2022-10-05 DIAGNOSIS — J9610 Chronic respiratory failure, unspecified whether with hypoxia or hypercapnia: Secondary | ICD-10-CM | POA: Insufficient documentation

## 2022-10-05 DIAGNOSIS — Z809 Family history of malignant neoplasm, unspecified: Secondary | ICD-10-CM | POA: Insufficient documentation

## 2022-10-05 DIAGNOSIS — I10 Essential (primary) hypertension: Secondary | ICD-10-CM | POA: Insufficient documentation

## 2022-10-05 DIAGNOSIS — R5383 Other fatigue: Secondary | ICD-10-CM | POA: Diagnosis not present

## 2022-10-05 DIAGNOSIS — Z801 Family history of malignant neoplasm of trachea, bronchus and lung: Secondary | ICD-10-CM | POA: Diagnosis not present

## 2022-10-05 DIAGNOSIS — Z803 Family history of malignant neoplasm of breast: Secondary | ICD-10-CM | POA: Diagnosis not present

## 2022-10-05 DIAGNOSIS — Z87891 Personal history of nicotine dependence: Secondary | ICD-10-CM | POA: Diagnosis not present

## 2022-10-05 DIAGNOSIS — R69 Illness, unspecified: Secondary | ICD-10-CM | POA: Diagnosis not present

## 2022-10-05 DIAGNOSIS — D500 Iron deficiency anemia secondary to blood loss (chronic): Secondary | ICD-10-CM

## 2022-10-05 DIAGNOSIS — Z79899 Other long term (current) drug therapy: Secondary | ICD-10-CM | POA: Diagnosis not present

## 2022-10-05 DIAGNOSIS — Z8051 Family history of malignant neoplasm of kidney: Secondary | ICD-10-CM | POA: Insufficient documentation

## 2022-10-05 DIAGNOSIS — Z811 Family history of alcohol abuse and dependence: Secondary | ICD-10-CM | POA: Insufficient documentation

## 2022-10-05 LAB — CBC WITH DIFFERENTIAL/PLATELET
Abs Immature Granulocytes: 0.03 10*3/uL (ref 0.00–0.07)
Basophils Absolute: 0 10*3/uL (ref 0.0–0.1)
Basophils Relative: 0 %
Eosinophils Absolute: 0.1 10*3/uL (ref 0.0–0.5)
Eosinophils Relative: 1 %
HCT: 31.8 % — ABNORMAL LOW (ref 36.0–46.0)
Hemoglobin: 9 g/dL — ABNORMAL LOW (ref 12.0–15.0)
Immature Granulocytes: 0 %
Lymphocytes Relative: 10 %
Lymphs Abs: 0.7 10*3/uL (ref 0.7–4.0)
MCH: 24.3 pg — ABNORMAL LOW (ref 26.0–34.0)
MCHC: 28.3 g/dL — ABNORMAL LOW (ref 30.0–36.0)
MCV: 85.9 fL (ref 80.0–100.0)
Monocytes Absolute: 0.5 10*3/uL (ref 0.1–1.0)
Monocytes Relative: 7 %
Neutro Abs: 5.5 10*3/uL (ref 1.7–7.7)
Neutrophils Relative %: 82 %
Platelets: 274 10*3/uL (ref 150–400)
RBC: 3.7 MIL/uL — ABNORMAL LOW (ref 3.87–5.11)
RDW: 18.4 % — ABNORMAL HIGH (ref 11.5–15.5)
WBC: 6.7 10*3/uL (ref 4.0–10.5)
nRBC: 0 % (ref 0.0–0.2)

## 2022-10-05 LAB — FERRITIN: Ferritin: 18 ng/mL (ref 11–307)

## 2022-10-05 LAB — FOLATE: Folate: 16.8 ng/mL (ref >5.9)

## 2022-10-05 LAB — COMPREHENSIVE METABOLIC PANEL
ALT: 19 U/L (ref 0–44)
AST: 17 U/L (ref 15–41)
Albumin: 4.6 g/dL (ref 3.5–5.0)
Alkaline Phosphatase: 77 U/L (ref 38–126)
Anion gap: 10 (ref 5–15)
BUN: 16 mg/dL (ref 8–23)
CO2: 29 mmol/L (ref 22–32)
Calcium: 9.4 mg/dL (ref 8.9–10.3)
Chloride: 100 mmol/L (ref 98–111)
Creatinine, Ser: 1.04 mg/dL — ABNORMAL HIGH (ref 0.44–1.00)
GFR, Estimated: 60 mL/min — ABNORMAL LOW (ref >60)
Glucose, Bld: 102 mg/dL — ABNORMAL HIGH (ref 70–99)
Potassium: 4.2 mmol/L (ref 3.5–5.1)
Sodium: 139 mmol/L (ref 135–145)
Total Bilirubin: 0.4 mg/dL (ref 0.3–1.2)
Total Protein: 8 g/dL (ref 6.5–8.1)

## 2022-10-05 LAB — VITAMIN B12: Vitamin B-12: 357 pg/mL (ref 180–914)

## 2022-10-05 LAB — RETIC PANEL
Immature Retic Fract: 26.6 % — ABNORMAL HIGH (ref 2.3–15.9)
RBC.: 3.66 MIL/uL — ABNORMAL LOW (ref 3.87–5.11)
Retic Count, Absolute: 94.4 10*3/uL (ref 19.0–186.0)
Retic Ct Pct: 2.6 % (ref 0.4–3.1)
Reticulocyte Hemoglobin: 16.2 pg — ABNORMAL LOW (ref >27.9)

## 2022-10-05 LAB — IRON AND TIBC
Iron: 25 ug/dL — ABNORMAL LOW (ref 28–170)
Saturation Ratios: 5 % — ABNORMAL LOW (ref 10.4–31.8)
TIBC: 528 ug/dL — ABNORMAL HIGH (ref 250–450)
UIBC: 503 ug/dL

## 2022-10-05 LAB — LACTATE DEHYDROGENASE: LDH: 203 U/L — ABNORMAL HIGH (ref 98–192)

## 2022-10-05 LAB — SAMPLE TO BLOOD BANK

## 2022-10-05 NOTE — Telephone Encounter (Signed)
She needs to stop her Xarelto for 3 days and I have sent DR [facility] a message to get approval.  I will let you know when we hear back.  I notified the patient too that we were waiting to get clearance.

## 2022-10-05 NOTE — Telephone Encounter (Signed)
Dr Dossie Arbour would like permission to stop Xarelto for 3 days for a Lumbar Facet Radiofrequency ablation.  Thank you

## 2022-10-06 ENCOUNTER — Encounter: Payer: Self-pay | Admitting: Oncology

## 2022-10-06 DIAGNOSIS — D500 Iron deficiency anemia secondary to blood loss (chronic): Secondary | ICD-10-CM | POA: Insufficient documentation

## 2022-10-06 LAB — KAPPA/LAMBDA LIGHT CHAINS
Kappa free light chain: 16.2 mg/L (ref 3.3–19.4)
Kappa, lambda light chain ratio: 1.34 (ref 0.26–1.65)
Lambda free light chains: 12.1 mg/L (ref 5.7–26.3)

## 2022-10-06 NOTE — Assessment & Plan Note (Addendum)
Check cbc, cmp iron TIBC Ferritin, Folate, B12, myeloma panel, copper, LDH.  Iron panel is consistent with iron deficiency anemia.  I discussed about option proceed with IV Venofer treatments. I discussed about the potential risks including but not limited to allergic reactions/infusion reactions including anaphylactic reactions, phlebitis, high blood pressure, wheezing, SOB, skin rash, weight gain,dark urine, leg swelling, back pain, headache, nausea and fatigue, etc. Patient agrees with the plan.  Plan IV venofer weekly x 4

## 2022-10-06 NOTE — Progress Notes (Signed)
Hematology/Oncology Consult note [phone] [phone]      Patient Care Team: Birdie Sons, MD as PCP - General (Family Medicine) [name], FNP as Nurse Practitioner (Cardiology) Yolonda Kida, MD as Consulting Physician (Cardiology) Erby Pian, MD as Referring Physician (Pulmonary Disease) Harlin Heys, MD as Consulting Physician (Obstetrics and Gynecology) Milinda Pointer, MD as Referring Physician (Pain Medicine) Idelle Leech, OD as Consulting Physician (Optometry) Anabel Bene, MD as Referring Physician (Neurology) Meade Maw, MD as Consulting Physician (Neurosurgery) Neldon Labella, RN as Case Manager   REFERRING PROVIDER: Birdie Sons, MD  CHIEF COMPLAINTS/REASON FOR VISIT:  Anemia  ASSESSMENT & PLAN:   Normocytic anemia Check cbc, cmp iron TIBC Ferritin, Folate, B12, myeloma panel, copper, LDH.  Iron panel is consistent with iron deficiency anemia.  I discussed about option proceed with IV Venofer treatments. I discussed about the potential risks including but not limited to allergic reactions/infusion reactions including anaphylactic reactions, phlebitis, high blood pressure, wheezing, SOB, skin rash, weight gain,dark urine, leg swelling, back pain, headache, nausea and fatigue, etc. Patient agrees with the plan.  Plan IV venofer weekly x 4  Orders Placed This Encounter  Procedures   Vitamin B12    Standing Status:   Future    Number of Occurrences:   1    Standing Expiration Date:   10/06/2023   Folate    Standing Status:   Future    Number of Occurrences:   1    Standing Expiration Date:   10/06/2023   Ferritin    Standing Status:   Future    Number of Occurrences:   1    Standing Expiration Date:   04/06/2023   Iron and TIBC    Standing Status:   Future    Number of Occurrences:   1    Standing Expiration Date:   10/06/2023   Retic Panel    Standing Status:   Future    Number of  Occurrences:   1    Standing Expiration Date:   10/06/2023   CBC with Differential/Platelet    Standing Status:   Future    Number of Occurrences:   1    Standing Expiration Date:   10/06/2023   Comprehensive metabolic panel    Standing Status:   Future    Number of Occurrences:   1    Standing Expiration Date:   10/06/2023   Multiple Myeloma Panel (SPEP&IFE w/QIG)    Standing Status:   Future    Number of Occurrences:   1    Standing Expiration Date:   10/06/2023   Kappa/lambda light chains    Standing Status:   Future    Number of Occurrences:   1    Standing Expiration Date:   10/06/2023   Lactate dehydrogenase    Standing Status:   Future    Number of Occurrences:   1    Standing Expiration Date:   10/06/2023   Copper, serum    Standing Status:   Future    Number of Occurrences:   1    Standing Expiration Date:   10/06/2023   Zinc    Standing Status:   Future    Number of Occurrences:   1    Standing Expiration Date:   10/06/2023   Hold Tube- Blood Bank    Standing Status:   Future    Number of Occurrences:   1    Standing Expiration  Date:   10/06/2023   Follow up in 8 weeks.  All questions were answered. The patient knows to call the clinic with any problems, questions or concerns.  Earlie Server, MD, PhD [facility] Hematology Oncology 10/05/2022     HISTORY OF PRESENTING ILLNESS:  Theresa [name] is a  66 y.o.  female with PMH listed below who was referred to me for anemia Reviewed patient's recent labs that was done.  She was found to have anemia since September 2023,  acute blood loss anemia Hb 7.4. She was on Xarelto for A Fib and Aspirin at that time, anticoagulation was held. She was transfused with PRBCs She underwent EGD, fount to have gastritis, non bleeding duodenal ulcers. Recently she follows up with Dr.Toledo and had repeat blood work on 09/19/22, which still showed persistent anemia with hemoglobin of 7.1, 09/27/2022 Ferritin 19, TIBC 578,  She has  chronic respiratory failure due to pulmonary hypertension, on nasal cannula oxygen, follows ups with pulmonology.  Currently she has resumed Xarelto   MEDICAL HISTORY:  Past Medical History:  Diagnosis Date   Acute postoperative pain 10/03/2017   Anxiety    BiPAP (biphasic positive airway pressure) dependence    at hs   Carpal tunnel syndrome    CHF (congestive heart failure) ([place])    1/18   CHF (congestive heart failure) ([facility])    Chronic generalized abdominal pain    Chronic rhinitis    COPD (chronic obstructive pulmonary disease) ([facility])    DDD (degenerative disc disease), cervical    DDD (degenerative disc disease), lumbosacral    Depression    Dyspnea    Edema    Flu    1/18   Gastritis    GERD (gastroesophageal reflux disease)    Hematuria    Hernia of abdominal wall 07/17/2018   Hernia, abdominal    Hypertension    Kidney stones    Low back pain    Lumbar radiculitis    Malodorous urine    Muscle weakness    Obesity    Oxygen dependent    Pneumonia due to COVID-19 virus 02/03/2020   Renal cyst    Sensory urge incontinence    Thyroid activity decreased    9/19   Tobacco abuse    Wheezing     SURGICAL HISTORY: Past Surgical History:  Procedure Laterality Date   ABDOMINAL HYSTERECTOMY     CARDIOVERSION N/A 04/29/2022   Procedure: CARDIOVERSION;  Surgeon: Corey Skains, MD;  Location: [facility] ORS;  Service: Cardiovascular;  Laterality: N/A;   CARPAL TUNNEL RELEASE Left 2012   cataract surgery Bilateral    EPIGASTRIC HERNIA REPAIR N/A 08/13/2018   HERNIA REPAIR EPIGASTRIC ADULT; polypropylene mesh reinforcement.  Surgeon: Robert Bellow, MD;  Location: [facility] OR[facility];  Service: General;  Laterality: N/A;   ESOPHAGOGASTRODUODENOSCOPY N/A 06/21/2022   Procedure: ESOPHAGOGASTRODUODENOSCOPY (EGD);  Surgeon: Toledo, Benay Pike, MD;  Location: [facility];  Service: Gastroenterology;  Laterality: N/A;   RIGHT/LEFT HEART CATH AND CORONARY ANGIOGRAPHY N/A 07/11/2019    Procedure: RIGHT/LEFT HEART CATH AND CORONARY ANGIOGRAPHY;  Surgeon: Yolonda Kida, MD;  Location: [facility];  Service: Cardiovascular;  Laterality: N/A;   TEE WITHOUT CARDIOVERSION N/A 04/29/2022   Procedure: TRANSESOPHAGEAL ECHOCARDIOGRAM (TEE);  Surgeon: Corey Skains, MD;  Location: [facility] OR[facility];  Service: Cardiovascular;  Laterality: N/A;   TONSILLECTOMY     TUBAL LIGATION     UMBILICAL HERNIA REPAIR N/A 08/13/2018   HERNIA REPAIR UMBILICAL ADULT; polypropylene mesh  reinforcement Surgeon: Robert Bellow, MD;  Location: [facility] OR[facility];  Service: General;  Laterality: N/A;    SOCIAL HISTORY: Social History   Socioeconomic History   Marital status: Married    Spouse name: Ovid Curd   Number of children: 2   Years of education: Not on file   Highest education level: 12th grade  Occupational History   Occupation: disability  Tobacco Use   Smoking status: Former    Packs/day: 1.50    Years: 44.00    Total pack years: 66.00    Types: Cigarettes    Quit date: 05/24/2022    Years since quitting: 0.3   Smokeless tobacco: Never   Tobacco comments:    over a half pack  Vaping Use   Vaping Use: Never used  Substance and Sexual Activity   Alcohol use: No   Drug use: Yes    Comment: prescribed hydrocodone   Sexual activity: Not Currently    Birth control/protection: Surgical  Other Topics Concern   Not on file  Social History Narrative   ** Merged History Encounter **       Lives with spouse    Social Determinants of Health   Financial Resource Strain: Low Risk  (09/20/2021)   Overall Financial Resource Strain (CARDIA)    Difficulty of Paying Living Expenses: Not very hard  Food Insecurity: No Food Insecurity (12/14/2021)   Hunger Vital Sign    Worried About Running Out of Food in the Last Year: Never true    Ran Out of Food in the Last Year: Never true  Transportation Needs: No Transportation Needs (12/14/2021)   PRAPARE - Transportation    Lack of  Transportation (Medical): No    Lack of Transportation (Non-Medical): No  Physical Activity: Inactive (09/20/2021)   Exercise Vital Sign    Days of Exercise per Week: 0 days    Minutes of Exercise per Session: 0 min  Stress: No Stress Concern Present (09/20/2021)   [place]    Feeling of Stress : Not at all  Social Connections: Moderately Integrated (09/20/2021)   Social Connection and Isolation Panel [NHANES]    Frequency of Communication with Friends and Family: More than three times a week    Frequency of Social Gatherings with Friends and Family: Once a week    Attends Religious Services: More than 4 times per year    Active Member of [employer] or Organizations: No    Attends Archivist Meetings: Never    Marital Status: Married  Human resources officer Violence: Not At Risk (01/22/2020)   Humiliation, Afraid, Rape, and Kick questionnaire    Fear of Current or Ex-Partner: No    Emotionally Abused: No    Physically Abused: No    Sexually Abused: No    FAMILY HISTORY: Family History  Problem Relation Age of Onset   Heart disease Mother    Stroke Mother    Coronary artery disease Mother    Alcohol abuse Father    Heart disease Father    Lung cancer Sister    Cancer Sister    Breast cancer Sister    Cancer Brother    Cancer Brother    Kidney cancer Brother    Pneumonia Brother    Prostate cancer Neg Hx    Bladder Cancer Neg Hx     ALLERGIES:  is allergic to gabapentin, codeine, and meloxicam.  MEDICATIONS:  Current Outpatient Medications  Medication Sig Dispense Refill  acetaminophen (TYLENOL) 500 MG tablet Take 1,000 mg by mouth every 8 (eight) hours as needed.     albuterol (VENTOLIN HFA) 108 (90 Base) MCG/ACT inhaler Inhale 2 puffs into the lungs every 6 (six) hours as needed for wheezing or shortness of breath. 8 g 1   amiodarone (PACERONE) 200 MG tablet Take 1 tablet (200 mg total) by mouth daily.  30 tablet 0   buPROPion (WELLBUTRIN SR) 150 MG 12 hr tablet Take 150 mg by mouth daily as needed (for smoking cessation).     busPIRone (BUSPAR) 5 MG tablet TAKE 1 TABLET BY MOUTH TWICE DAILY 180 tablet 1   butalbital-acetaminophen-caffeine (FIORICET) 50-325-40 MG tablet Take 1 tablet by mouth every 6 (six) hours as needed for headache. LIMIT USE TO PREVENT MEDICATION OVERUSE HEADACHE 14 tablet 0   Cholecalciferol 50 MCG (2000 UT) CAPS Take 2,000 Units by mouth daily.      dapagliflozin propanediol (FARXIGA) 10 MG TABS tablet Take 1 tablet (10 mg total) by mouth daily before breakfast. 30 tablet 5   diclofenac Sodium (VOLTAREN) 1 % GEL Apply topically. Apply to affected area up to 4 times per day (shoulder and lowe back)     diltiazem (CARDIZEM CD) 180 MG 24 hr capsule Take 180 mg by mouth daily.     Fluticasone-Umeclidin-Vilant 100-62.5-25 MCG/INH AEPB Inhale 1 puff into the lungs daily.     HYDROcodone-acetaminophen (NORCO/VICODIN) 5-325 MG tablet Take 1 tablet by mouth every 8 (eight) hours as needed for severe pain. Must last 30 days 90 tablet 0   ipratropium-albuterol (DUONEB) 0.5-2.5 (3) MG/3ML SOLN USE 1 VIAL VIA NEBULIZER EVERY 6 HOURS AS NEEDED FOR SHORTNESS OF BREATH OR WHEEZING 1080 mL 0   OXYGEN Inhale 4 L/min into the lungs continuous. 4 L at rest 8 L with exertion  10 L shower     pantoprazole (PROTONIX) 40 MG tablet Take 1 tablet (40 mg total) by mouth daily. (Patient taking differently: Take 40 mg by mouth 2 (two) times daily.) 30 tablet 2   potassium chloride SA (KLOR-CON M) 20 MEQ tablet Take 2 tablets every morning and 1 tablet every evening 270 tablet 3   rivaroxaban (XARELTO) 20 MG TABS tablet Take 1 tablet (20 mg total) by mouth daily with supper. 30 tablet 0   roflumilast (DALIRESP) 500 MCG TABS tablet Take 500 mcg by mouth daily.     sertraline (ZOLOFT) 100 MG tablet TAKE 2 TABLETS BY MOUTH ONCE EVERY MORNING 180 tablet 0   simvastatin (ZOCOR) 20 MG tablet Take 20 mg by  mouth at bedtime.     spironolactone (ALDACTONE) 25 MG tablet Take 1 tablet by mouth daily.     torsemide (DEMADEX) 20 MG tablet Take 20 mg by mouth 2 (two) times daily. 80 mg (4 tablets) in the morning, and 40 mg (2 tablets) in the evening     varenicline (CHANTIX) 1 MG tablet Take by mouth.     ambrisentan (LETAIRIS) 5 MG tablet Take 5 mg by mouth daily. (Patient not taking: Reported on 10/05/2022)     HYDROcodone-acetaminophen (NORCO/VICODIN) 5-325 MG tablet Take 1 tablet by mouth every 8 (eight) hours as needed for severe pain. Must last 30 days 90 tablet 0   HYDROcodone-acetaminophen (NORCO/VICODIN) 5-325 MG tablet Take 1 tablet by mouth every 8 (eight) hours as needed for severe pain. Must last 30 days 90 tablet 0   metolazone (ZAROXOLYN) 2.5 MG tablet Take 1 tablet (2.5 mg total) by mouth every other day. Take  1/2 hour before torsemide (Patient not taking: Reported on 10/05/2022) 3 tablet 0   naloxone (NARCAN) nasal spray 4 mg/0.1 mL Place 1 spray into the nose as needed for up to 365 doses (for opioid-induced respiratory depresssion). In case of emergency (overdose), spray once into each nostril. If no response within 3 minutes, repeat application and call [phone]. (Patient not taking: Reported on 10/05/2022) 1 each 0   sucralfate (CARAFATE) 1 g tablet Take 1 g by mouth 4 (four) times daily. (Patient not taking: Reported on 10/05/2022)     No current facility-administered medications for this visit.    Review of Systems  Constitutional:  Positive for fatigue. Negative for chills and fever.  HENT:   Negative for hearing loss and voice change.   Eyes:  Negative for eye problems.  Respiratory:  Positive for shortness of breath. Negative for chest tightness and cough.   Cardiovascular:  Negative for chest pain.  Gastrointestinal:  Negative for abdominal distention, abdominal pain and blood in stool.  Endocrine: Negative for hot flashes.  Genitourinary:  Negative for difficulty urinating and  frequency.   Musculoskeletal:  Negative for arthralgias.  Skin:  Negative for itching and rash.  Neurological:  Negative for extremity weakness.  Hematological:  Negative for adenopathy. Bruises/bleeds easily.  Psychiatric/Behavioral:  Negative for confusion.     PHYSICAL EXAMINATION: Vitals:   10/05/22 1125  BP: 118/72  Pulse: 77  Resp: 18  Temp: 98.7 F (37.1 C)  SpO2: 100%   Filed Weights   10/05/22 1125  Weight: 208 lb 14.4 oz (94.8 kg)    Physical Exam Constitutional:      General: She is not in acute distress.    Appearance: She is obese.     Comments: Patient sits in the wheelchair  HENT:     Head: Normocephalic and atraumatic.  Eyes:     General: No scleral icterus. Cardiovascular:     Rate and Rhythm: Normal rate.  Pulmonary:     Effort: Pulmonary effort is normal. No respiratory distress.     Breath sounds: No wheezing.     Comments: Patient breathes comfortably via nasal cannula oxygen.  Decreased breath sound bilaterally Abdominal:     General: Bowel sounds are normal. There is no distension.     Palpations: Abdomen is soft.  Musculoskeletal:        General: No deformity. Normal range of motion.     Cervical back: Normal range of motion and neck supple.  Skin:    General: Skin is warm and dry.     Findings: No erythema or rash.  Neurological:     Mental Status: She is alert and oriented to person, place, and time. Mental status is at baseline.     Cranial Nerves: No cranial nerve deficit.  Psychiatric:        Mood and Affect: Mood normal.      LABORATORY DATA:  I have reviewed the data as listed    Latest Ref Rng & Units 10/05/2022   12:03 PM 06/22/2022    8:11 AM 06/22/2022    6:36 AM  CBC  WBC 4.0 - 10.5 K/uL 6.7     Hemoglobin 12.0 - 15.0 g/dL 9.0  8.4  8.0   Hematocrit 36.0 - 46.0 % 31.8  29.0  27.4   Platelets 150 - 400 K/uL 274         Latest Ref Rng & Units 10/05/2022   12:03 PM 09/21/2022   12:26 PM 09/16/2022  10:56 AM  CMP   Glucose 70 - 99 mg/dL 102  131  107   BUN 8 - 23 mg/dL _0 Creatinine 0.44 - 1.00 mg/dL 1.04  1.16  0.71   Sodium 135 - 145 mmol/L 139  139  141   Potassium 3.5 - 5.1 mmol/L 4.2  4.7  2.5   Chloride 98 - 111 mmol/L 100  103  96   CO2 22 - 32 mmol/L 29  27  33   Calcium 8.9 - 10.3 mg/dL 9.4  9.3  9.1   Total Protein 6.5 - 8.1 g/dL 8.0     Total Bilirubin 0.3 - 1.2 mg/dL 0.4     Alkaline Phos 38 - 126 U/L 77     AST 15 - 41 U/L 17     ALT 0 - 44 U/L 19         Component Value Date/Time   IRON 25 (L) 10/05/2022 1203   TIBC 528 (H) 10/05/2022 1203   FERRITIN 18 10/05/2022 1203   IRONPCTSAT 5 (L) 10/05/2022 1203     RADIOGRAPHIC STUDIES: I have personally reviewed the radiological images as listed and agreed with the findings in the report. DG PAIN CLINIC C-ARM 1-60 MIN NO REPORT  Result Date: 09/20/2022 Fluoro was used, but no Radiologist interpretation will be provided. Please refer to "NOTES" tab for provider progress note.

## 2022-10-06 NOTE — Addendum Note (Signed)
Addended by: Earlie Server on: 10/06/2022 09:49 PM   Modules accepted: Orders

## 2022-10-07 ENCOUNTER — Telehealth: Payer: Self-pay | Admitting: Family Medicine

## 2022-10-07 ENCOUNTER — Telehealth: Payer: Self-pay

## 2022-10-07 LAB — MULTIPLE MYELOMA PANEL, SERUM
Albumin SerPl Elph-Mcnc: 4.1 g/dL (ref 2.9–4.4)
Albumin/Glob SerPl: 1.4 (ref 0.7–1.7)
Alpha 1: 0.3 g/dL (ref 0.0–0.4)
Alpha2 Glob SerPl Elph-Mcnc: 0.8 g/dL (ref 0.4–1.0)
B-Globulin SerPl Elph-Mcnc: 1.1 g/dL (ref 0.7–1.3)
Gamma Glob SerPl Elph-Mcnc: 0.9 g/dL (ref 0.4–1.8)
Globulin, Total: 3.1 g/dL (ref 2.2–3.9)
IgA: 89 mg/dL (ref 87–352)
IgG (Immunoglobin G), Serum: 751 mg/dL (ref 586–1602)
IgM (Immunoglobulin M), Srm: 154 mg/dL (ref 26–217)
Total Protein ELP: 7.2 g/dL (ref 6.0–8.5)

## 2022-10-07 NOTE — Telephone Encounter (Signed)
Left message for patient to call back and schedule Medicare Annual Wellness Visit (AWV) in office.   If not able to come in office, please offer to do virtually or by telephone.  Left office number and my jabber [phone].  Last AWV:09/20/2021   Please schedule at anytime with Nurse Health Advisor.

## 2022-10-07 NOTE — Telephone Encounter (Signed)
Pt has been informed.   Please schedule and inform pt of appts:   Venofer weekly x4 Labs in 2 months MD/ +/- venofer 1-2 days AFTER labs

## 2022-10-07 NOTE — Telephone Encounter (Signed)
-----  Message from Earlie Server, MD sent at 10/06/2022  9:48 PM EST ----- Please let her know that her blood level has improved to 9, iron level is low. I recommend IV Venofer weekly x 4 Please arrange follow up in 2 months, labs prior to MD +/- Venofer. Thanks.

## 2022-10-11 ENCOUNTER — Encounter: Payer: Self-pay | Admitting: Oncology

## 2022-10-13 LAB — ZINC: Zinc: 83 ug/dL (ref 44–115)

## 2022-10-13 LAB — COPPER, SERUM: Copper: 116 ug/dL (ref 80–158)

## 2022-10-14 IMAGING — DX DG CHEST 1V PORT
1 series · 1 of 1 positions shown · non-contrast
Comparison: Chest radiographs 08/06/2021

CLINICAL DATA: Questionable sepsis.  Shortness of breath.

EXAM:
PORTABLE CHEST 1 VIEW

[chest ap]
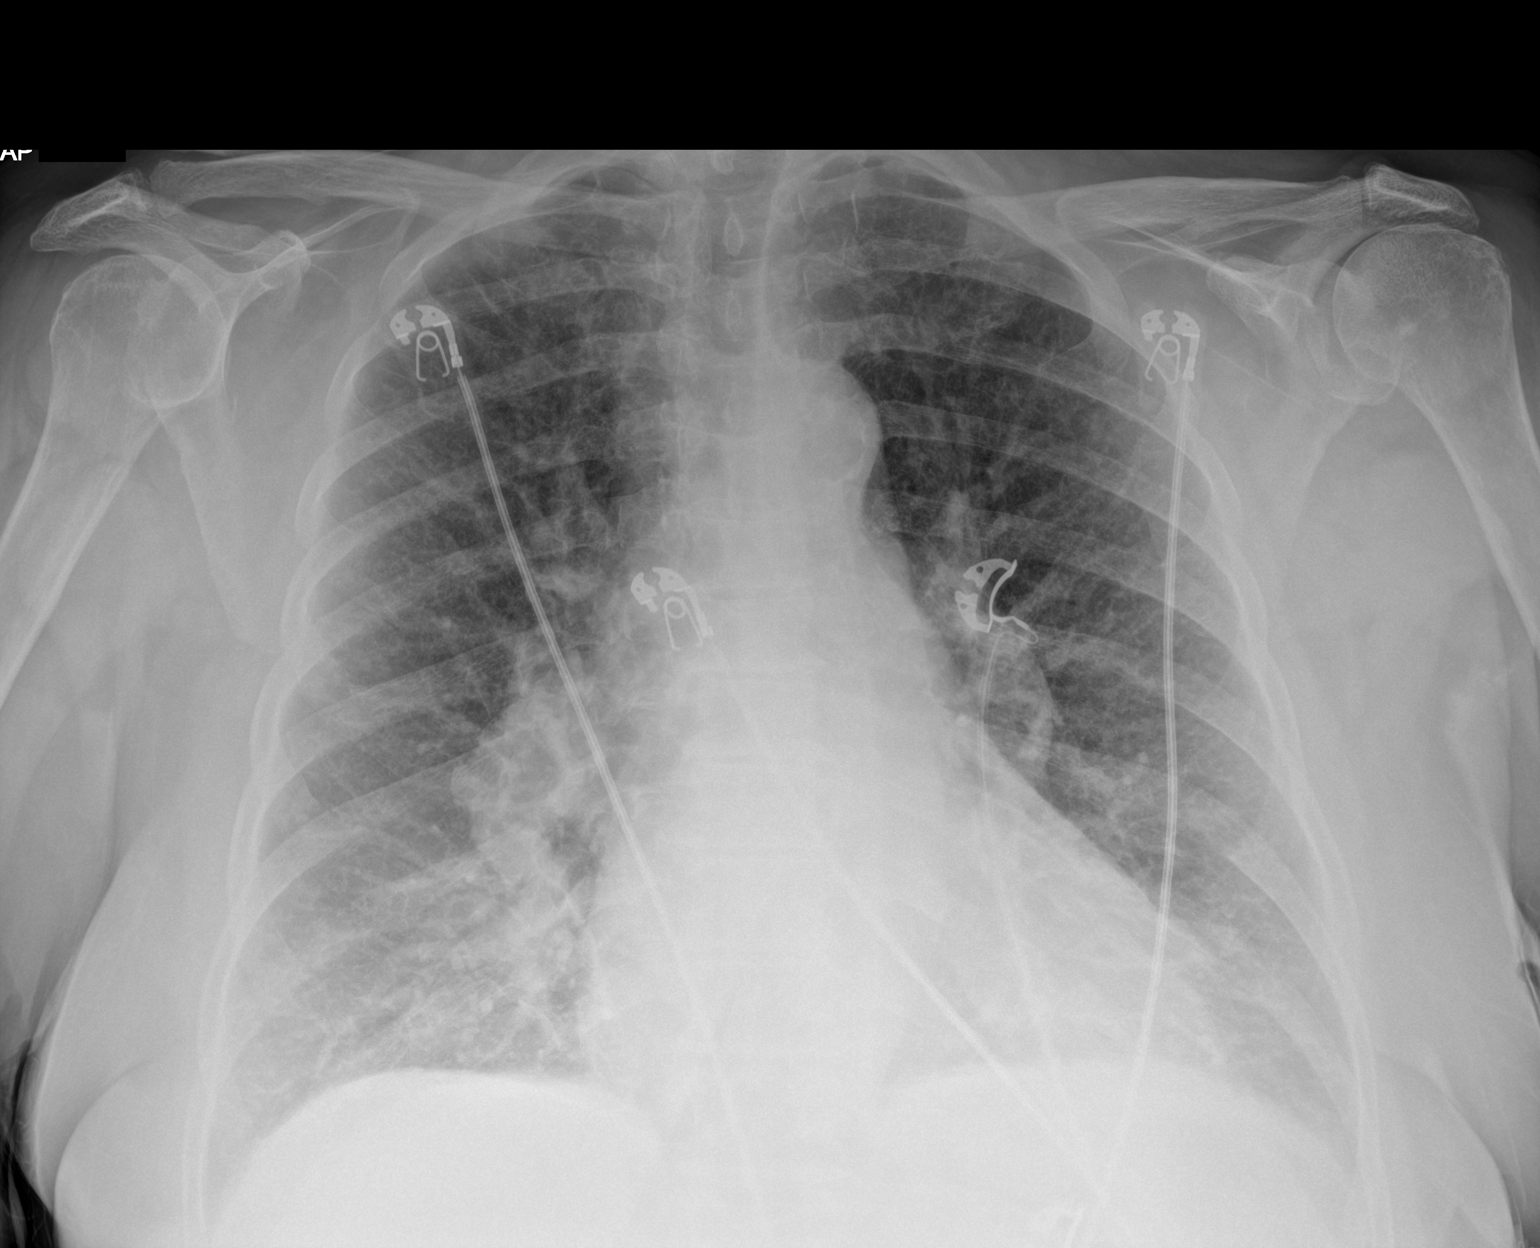

[1 of 1 positions shown; findings below may reference images not displayed]

FINDINGS: The cardiac silhouette is upper limits of normal in size. There is
unchanged enlargement of the pulmonary arteries. Aortic
atherosclerosis is noted. The lungs are hyperinflated with chronic
peribronchial thickening and underlying emphysema. The interstitial
markings are slightly more prominent diffusely compared to the prior
study. No focal airspace consolidation, sizeable pleural effusion,
or pneumothorax is identified. No acute osseous abnormality is seen.
IMPRESSION: COPD with superimposed mild edema or infection not excluded.

## 2022-10-14 IMAGING — CT CT HEAD W/O CM
4 series · 16 of 47 positions shown, 18 images · non-contrast
Comparison: Comparison is made with October 11, 2019.

CLINICAL DATA: 65-year-old female presents with new or worsening
headache.



[Series 2: head wo · axial · 0.40mm/px · z∈[-133,-23]mm · 7 of 30 slices shown, 9 images]
[im 4/30  brain]
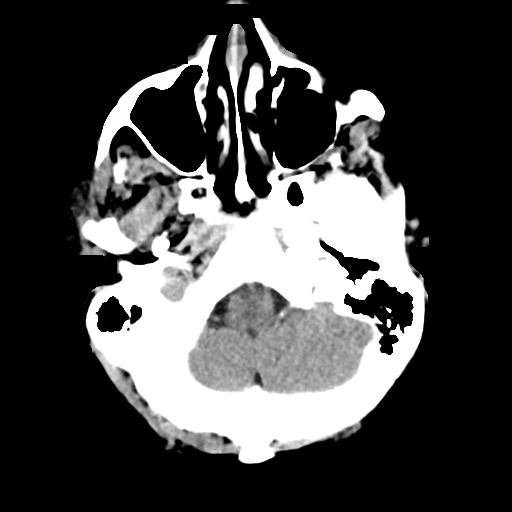
[im 4/30  bone]
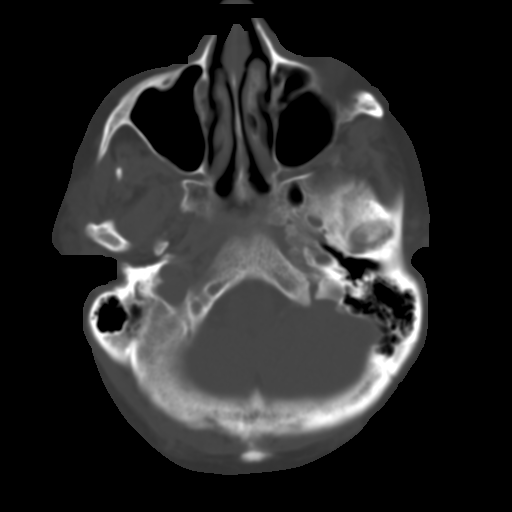
[im 8/30  brain]
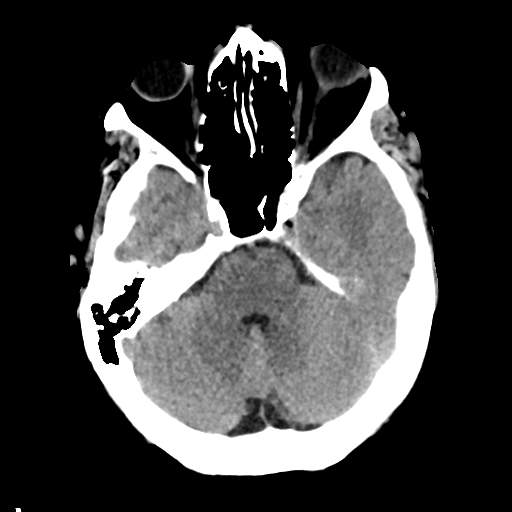
[im 11/30  brain]
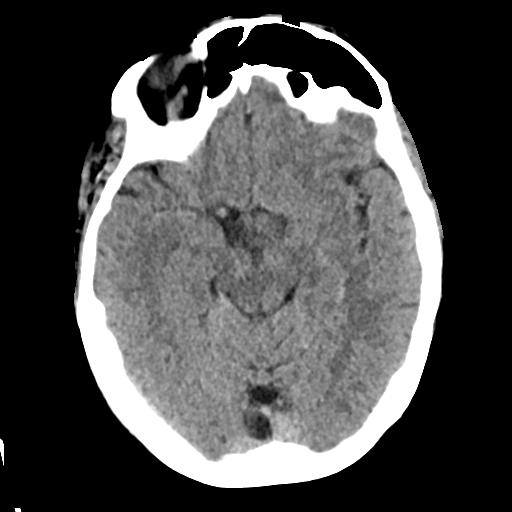
[im 15/30  brain]
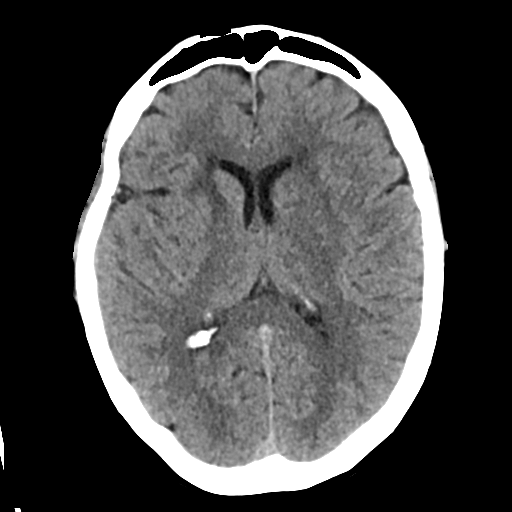
[im 19/30  brain]
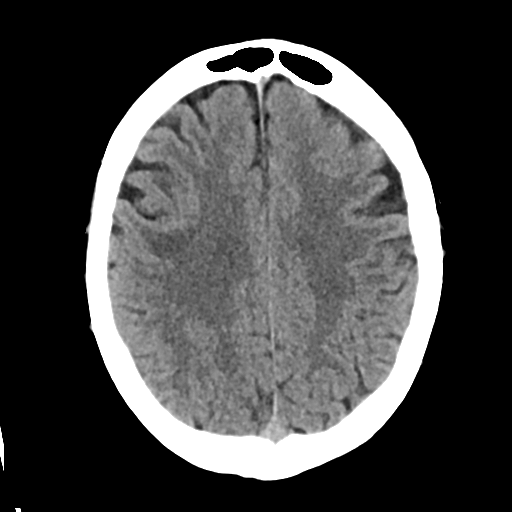
[im 19/30  bone]
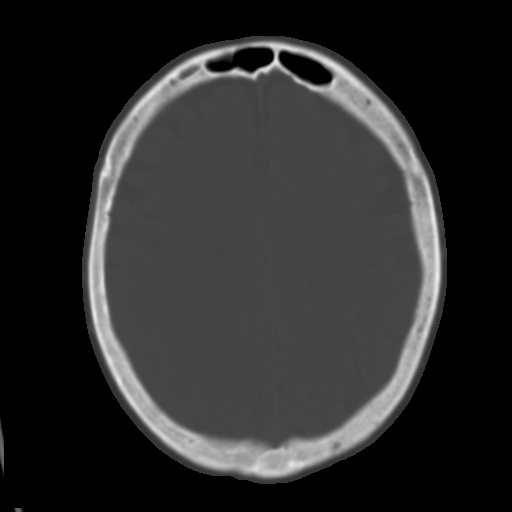
[im 22/30  brain]
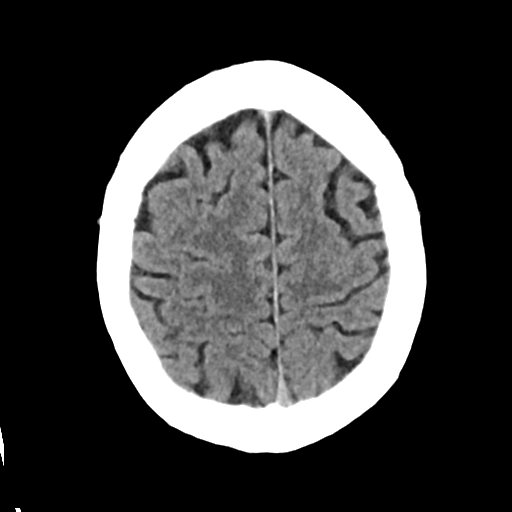
[im 26/30  brain]
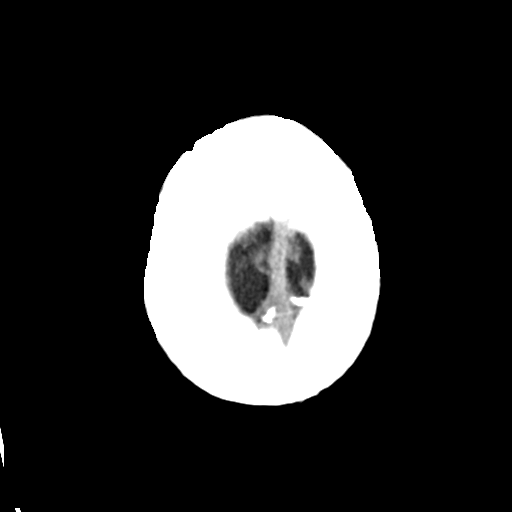

[Series 3: head bone · axial · 0.40mm/px · z∈[-134,-104]mm · 3 of 75 slices shown]
[im 8/75  bone]
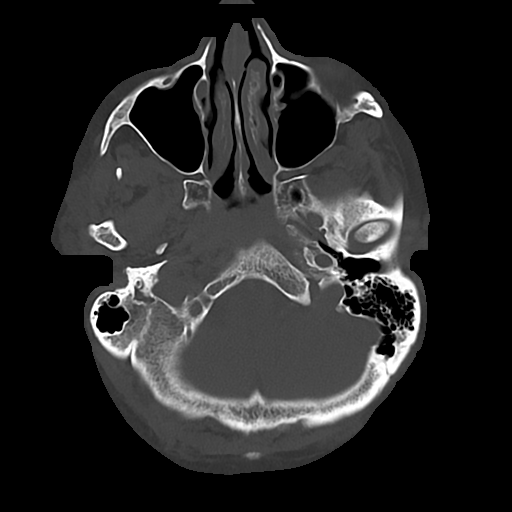
[im 15/75  bone]
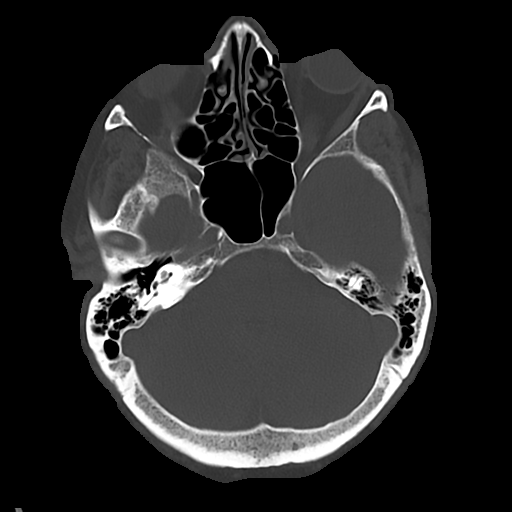
[im 23/75  bone]
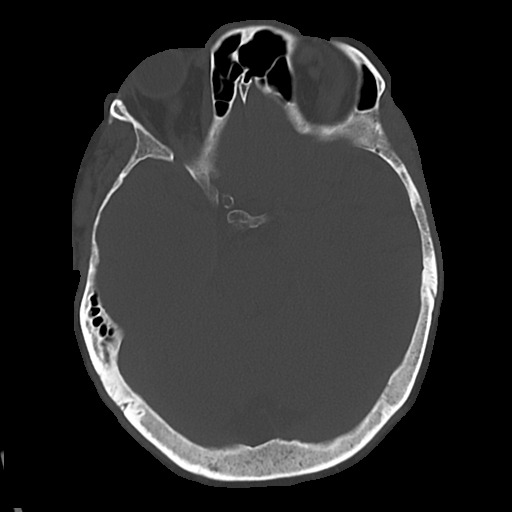

[Series 4: cor soft · coronal · 0.32mm/px · 3 of 70 slices shown]
[im 24/70  brain]
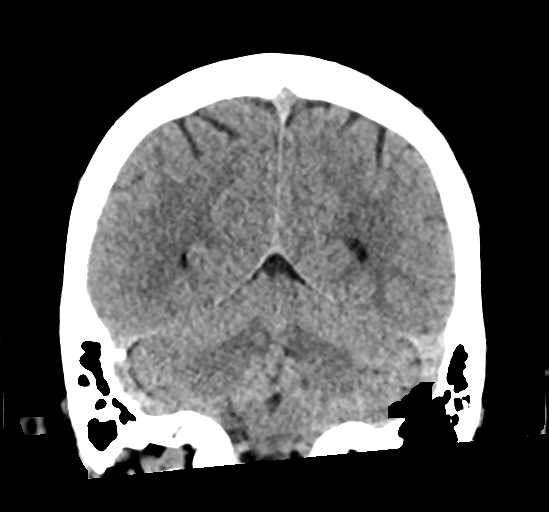
[im 31/70  brain]
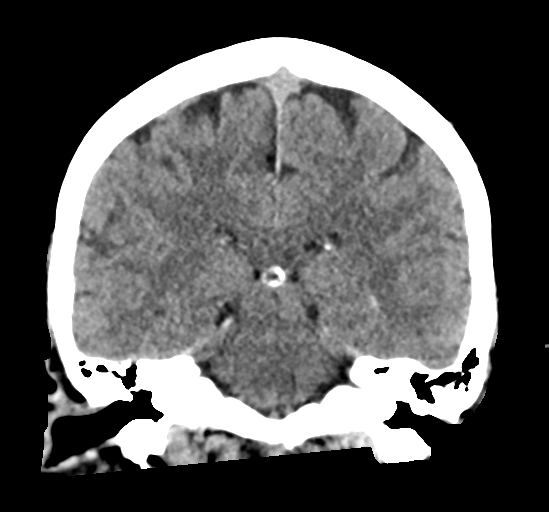
[im 39/70  brain]
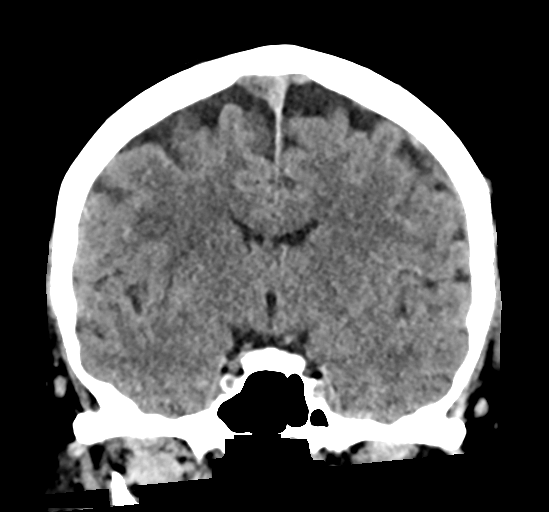

[Series 5: sag soft · sagittal · 0.33mm/px · 3 of 60 slices shown]
[im 20/60  brain]
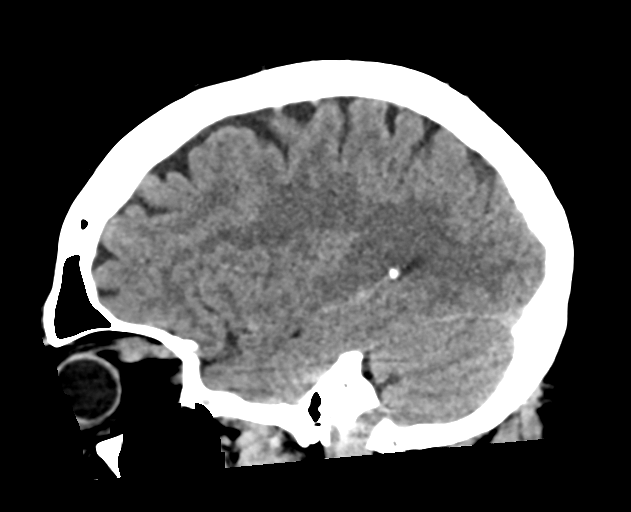
[im 30/60  brain]
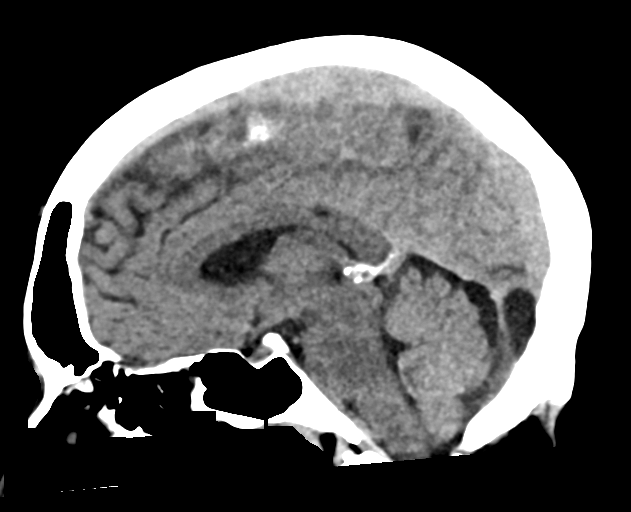
[im 40/60  brain]
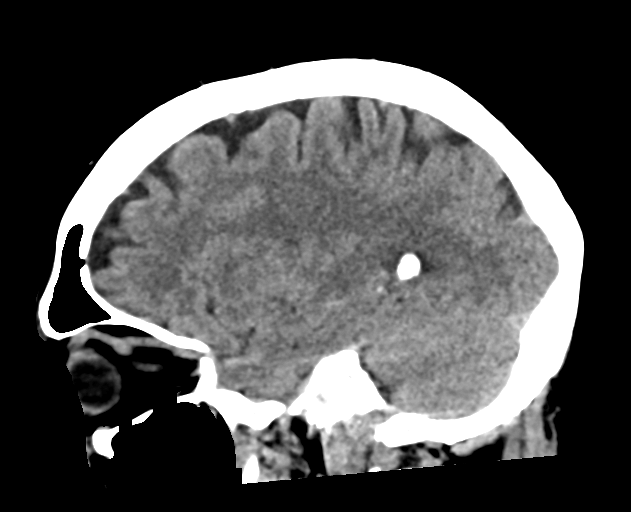

[16 of 47 positions shown; findings below may reference images not displayed]

FINDINGS: Brain: No evidence of acute infarction, hemorrhage, hydrocephalus,
extra-axial collection or mass lesion/mass effect. Signs of mild
atrophy and chronic microvascular ischemic change which is similar
to previous imaging

Vascular: No hyperdense vessel or unexpected calcification.

Skull: Normal. Negative for fracture or focal lesion.

Sinuses/Orbits: Visualized paranasal sinuses and orbits show no
acute process to the extent evaluated.

Other: None
IMPRESSION: 1. No acute intracranial abnormality.
2. Signs of mild chronic microvascular ischemic change which is
similar to previous imaging.

## 2022-10-17 IMAGING — MR MR HEAD W/O CM
12 series · 48 of 48 positions shown · non-contrast
Comparison: Head CT 04/01/2022. Head MRI 10/11/2019 and 12/10/2017.

CLINICAL DATA: Occipital region headache and scalp tenderness.

EXAM:
MRI HEAD WITHOUT CONTRAST
TECHNIQUE: Multiplanar, multiecho pulse sequences of the brain and surrounding
structures were obtained without intravenous contrast.

[Series 5: ax dwi_tracew · axial · 3.0mm · 0.65mm/px · z∈[-92,+62]mm · 4 of 48 slices shown]
[im 1/48]
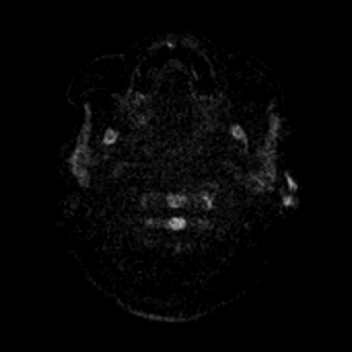
[im 16/48]
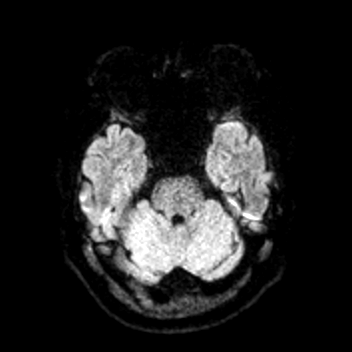
[im 32/48]
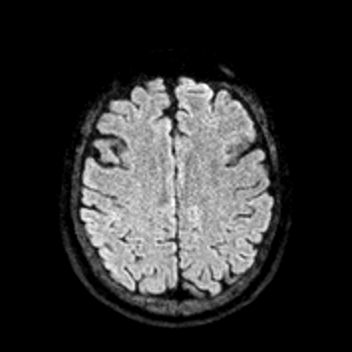
[im 48/48]
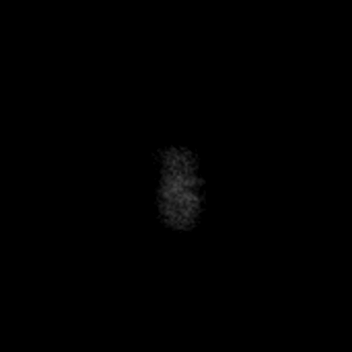

[Series 6: ax dwi_adc · axial · 3.0mm · 0.65mm/px · z∈[-92,+62]mm · 3 of 48 slices shown]
[im 1/48]
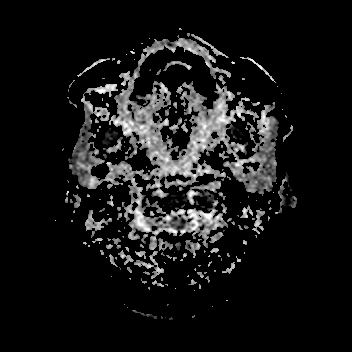
[im 24/48]
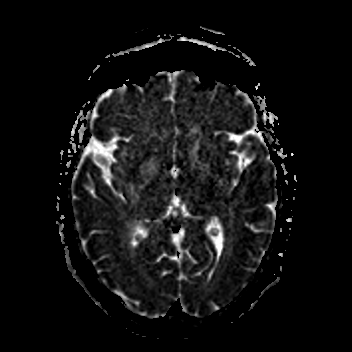
[im 48/48]
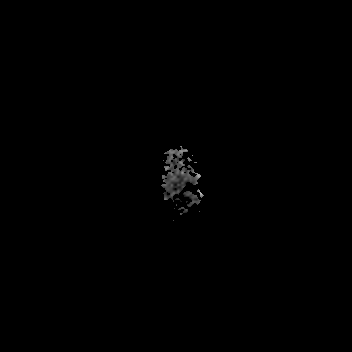

[Series 7: cor dwi_tracew · coronal · 5.0mm · 0.65mm/px · 3 of 40 slices shown]
[im 1/40]
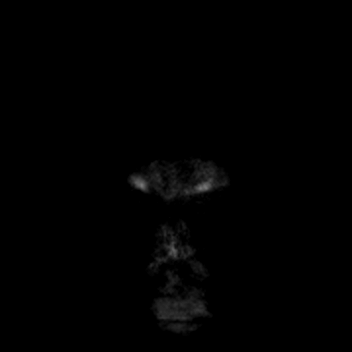
[im 20/40]
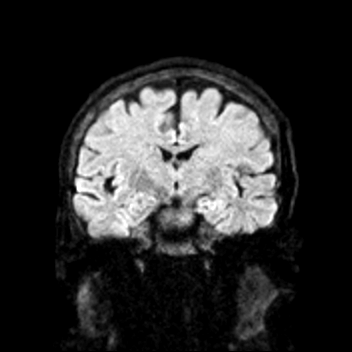
[im 40/40]
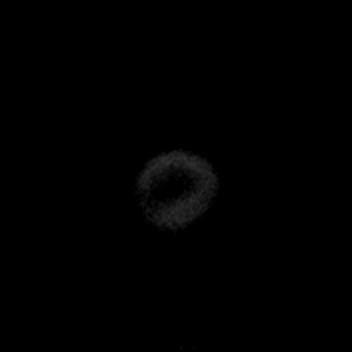

[Series 8: cor dwi_adc · coronal · 5.0mm · 0.65mm/px · 3 of 39 slices shown]
[im 1/39]
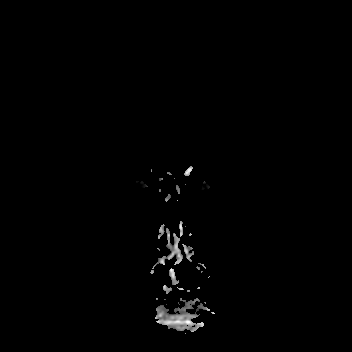
[im 20/39]
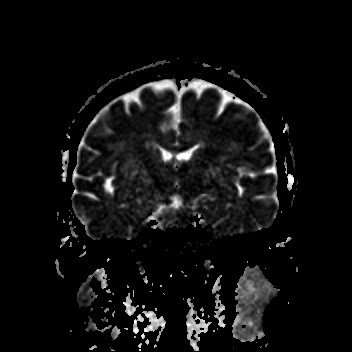
[im 39/39]
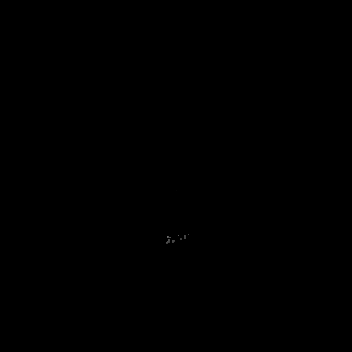

[Series 9: T1 · sagittal · 5.0mm · 0.62mm/px · 2 of 25 slices shown (1 of 2)]
[im 1/25]
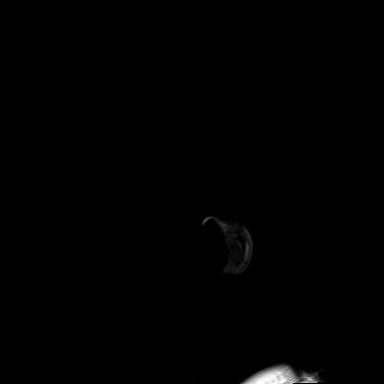
[im 25/25]
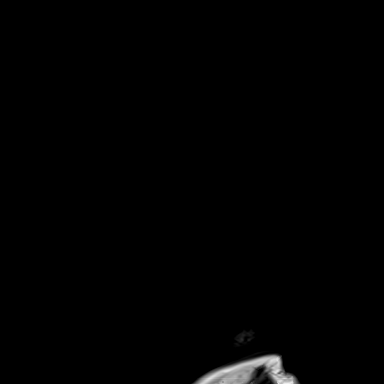

[Series 10: T2 · axial · 5.0mm · 0.53mm/px · z∈[-86,+57]mm · 2 of 25 slices shown (1 of 2)]
[im 1/25]
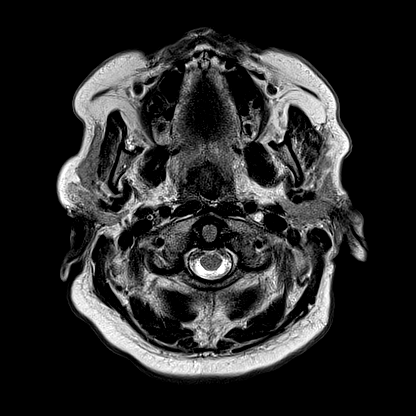
[im 25/25]
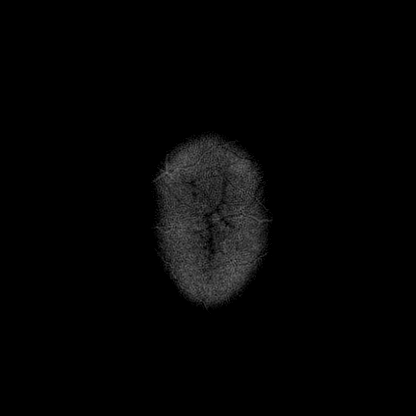

[Series 11: mag_images · axial · 3.0mm · 0.90mm/px · z∈[-103,+73]mm · 4 of 60 slices shown]
[im 1/60]
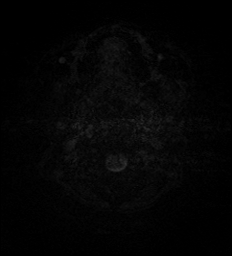
[im 20/60]
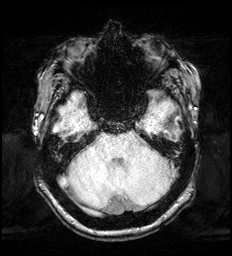
[im 40/60]
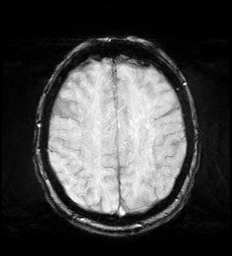
[im 60/60]
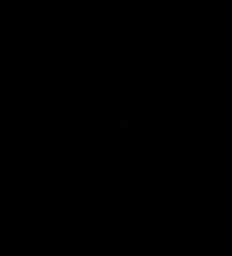

[Series 12: pha_images · axial · 3.0mm · 0.90mm/px · z∈[-103,+73]mm · 4 of 58 slices shown]
[im 1/58]
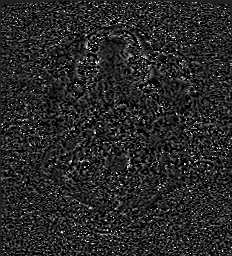
[im 20/58]
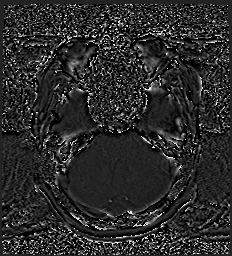
[im 39/58]
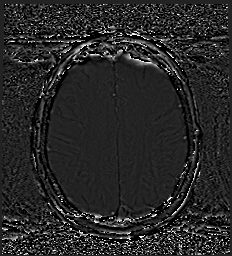
[im 58/58]
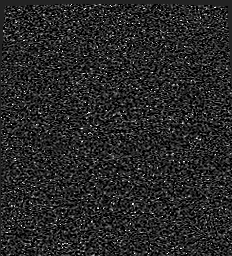

[Series 13: swi_images · axial · 3.0mm · 0.90mm/px · z∈[-103,+73]mm · 4 of 60 slices shown]
[im 1/60]
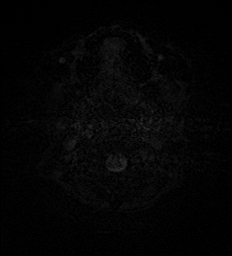
[im 20/60]
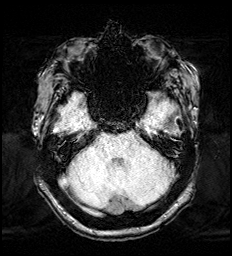
[im 40/60]
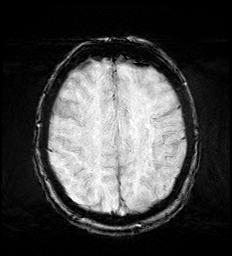
[im 60/60]
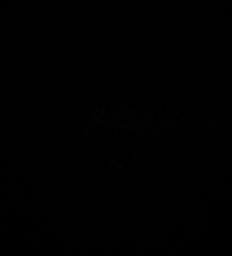

[Series 15: FLAIR · axial · 3.0mm · 0.53mm/px · z∈[-95,+66]mm · 4 of 55 slices shown]
[im 1/55]
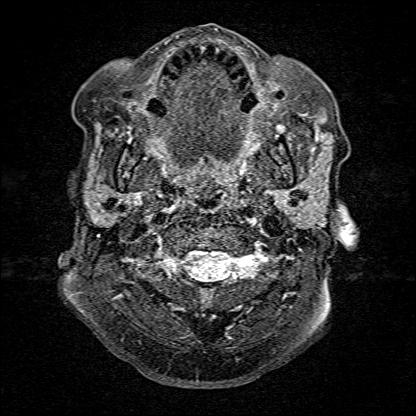
[im 19/55]
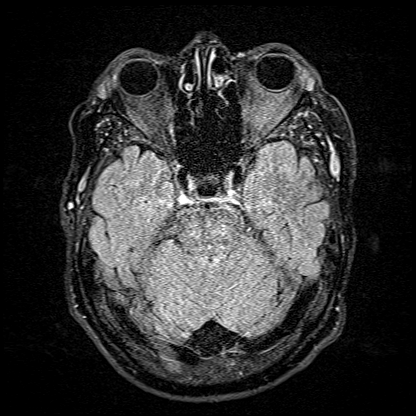
[im 37/55]
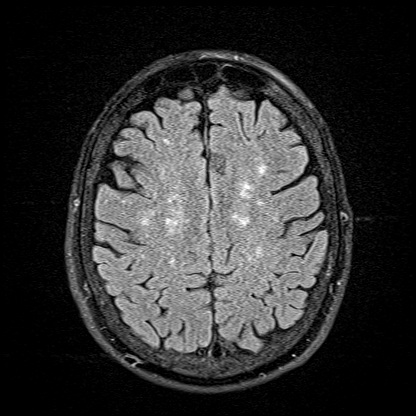
[im 55/55]
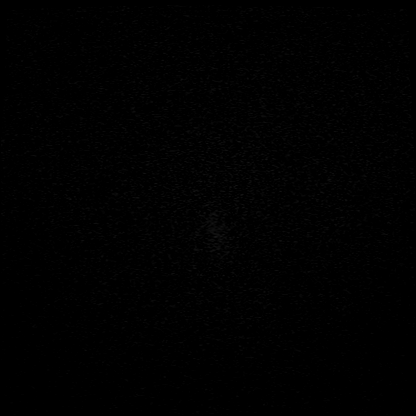

[Series 16: T1 · axial · 1.0mm · 0.98mm/px · z∈[-103,+70]mm · 13 of 176 slices shown (2 of 2)]
[im 1/176]
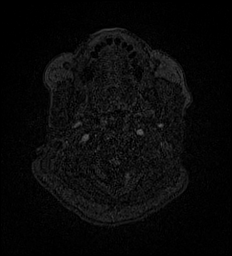
[im 15/176]
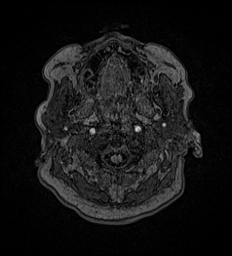
[im 30/176]
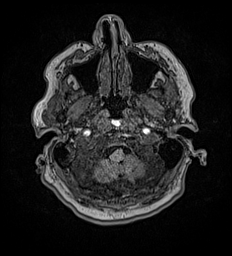
[im 44/176]
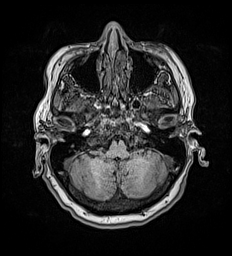
[im 59/176]
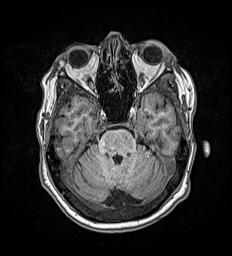
[im 73/176]
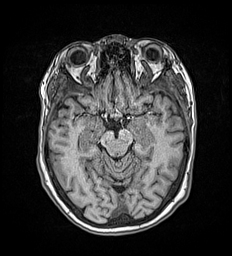
[im 88/176]
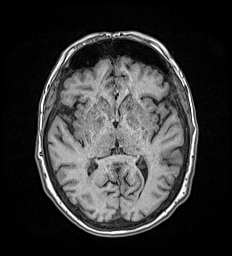
[im 103/176]
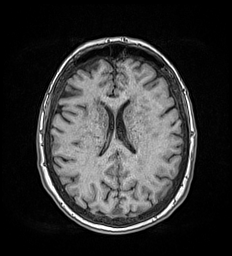
[im 117/176]
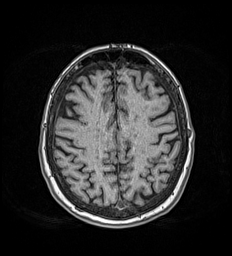
[im 132/176]
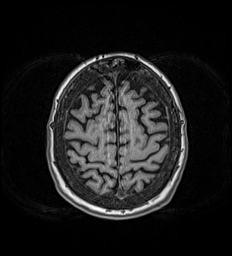
[im 146/176]
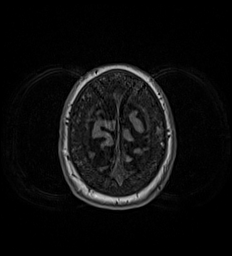
[im 161/176]
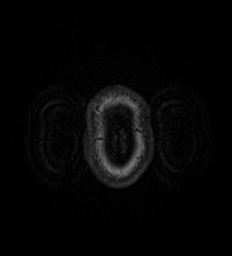
[im 176/176]
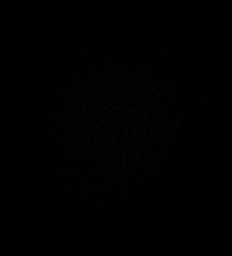

[Series 17: T2 · coronal · 5.0mm · 0.57mm/px · 2 of 30 slices shown (2 of 2)]
[im 1/30]
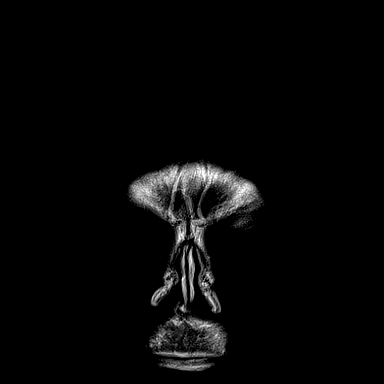
[im 30/30]
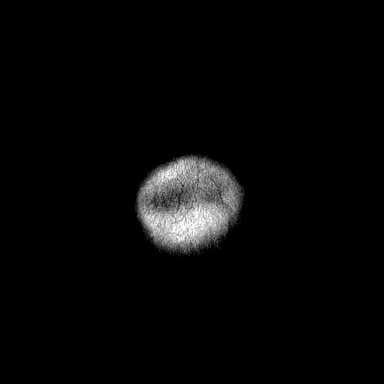

[48 of 48 positions shown; findings below may reference images not displayed]

FINDINGS: Brain: There is no evidence of an acute infarct, intracranial
hemorrhage, mass, midline shift, or extra-axial fluid collection.
Patchy T2 hyperintensities in the cerebral white matter, basal
ganglia, thalami, and brainstem are stable to mildly increased
compared to the prior MRI and are nonspecific but compatible with
moderate chronic small vessel ischemic disease. The ventricles and
sulci are within normal limits for age.

Vascular: Major intracranial vascular flow voids are preserved.

Skull and upper cervical spine: Diffuse bone marrow heterogeneity
including increased trace diffusion weighted signal intensity,
chronic based on the 1759 MRI and nonspecific. No discrete focal
lesion.

Sinuses/Orbits: Bilateral cataract extraction. Paranasal sinuses and
mastoid air cells are clear.

Other: None.
IMPRESSION: 1. No acute intracranial abnormality.
2. Moderate chronic small vessel ischemic disease.

## 2022-10-18 NOTE — Telephone Encounter (Signed)
Faxed approval to stop Xarelto for 3 days to Dr Clayborn Bigness since I didn't get a reply from the inbox.

## 2022-10-19 IMAGING — MR MR CERVICAL SPINE W/O CM
5 series · 37 of 48 positions shown · non-contrast
Comparison: 12/25/2007 MRI cervical spine 10/11/2019 CT cervical
spine

CLINICAL DATA: Cervical myelopathy

EXAM:
MRI CERVICAL SPINE WITHOUT CONTRAST
TECHNIQUE: Multiplanar, multisequence MR imaging of the cervical spine was
performed. No intravenous contrast was administered.

[Series 5: T2 · sagittal · 3.0mm · 0.62mm/px · 6 of 15 slices shown (1 of 2)]
[im 1/15]
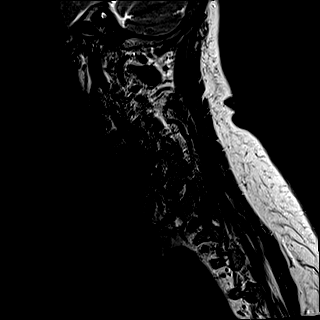
[im 3/15]
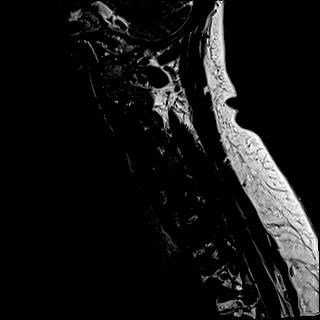
[im 6/15]
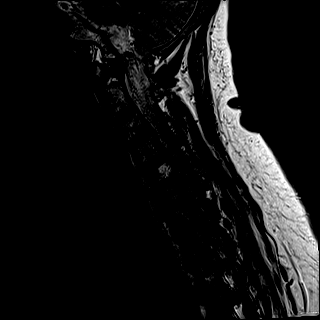
[im 9/15]
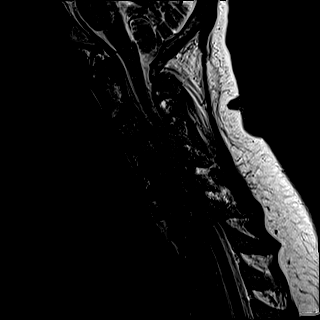
[im 12/15]
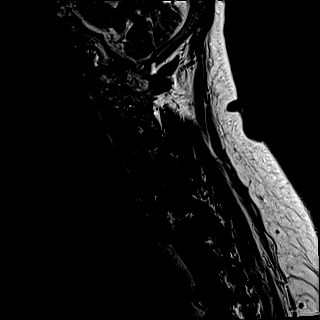
[im 15/15]
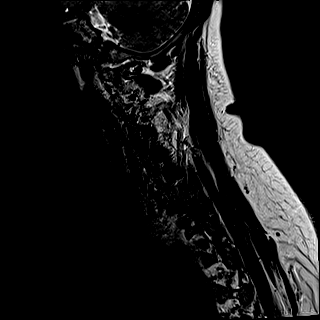

[Series 6: FLAIR · sagittal · 3.0mm · 0.78mm/px · 7 of 15 slices shown]
[im 1/15]
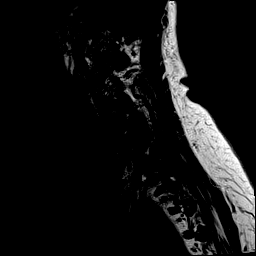
[im 3/15]
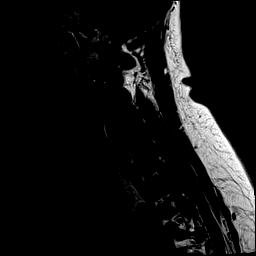
[im 5/15]
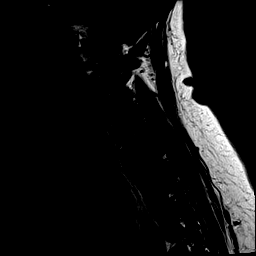
[im 8/15]
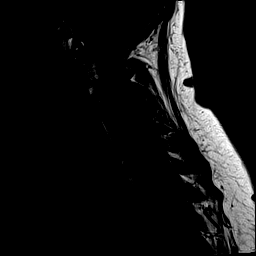
[im 10/15]
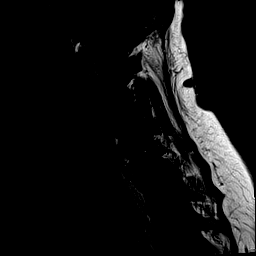
[im 12/15]
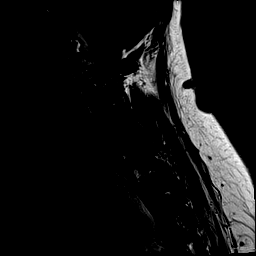
[im 15/15]
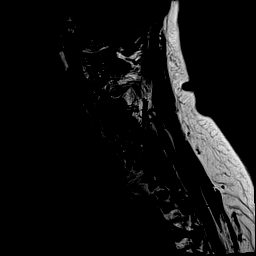

[Series 7: STIR · sagittal · 3.0mm · 0.62mm/px · 7 of 15 slices shown]
[im 1/15]
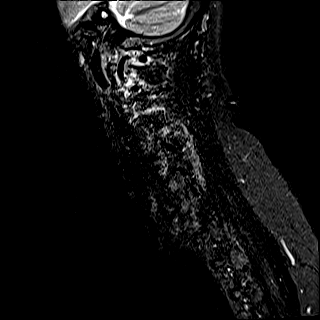
[im 3/15]
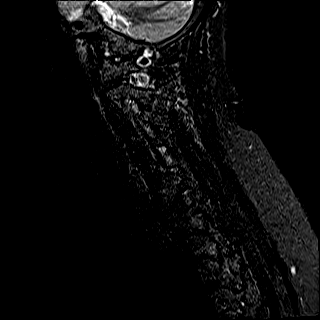
[im 5/15]
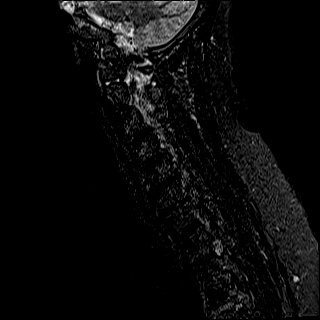
[im 8/15]
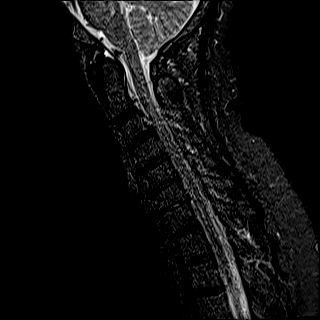
[im 10/15]
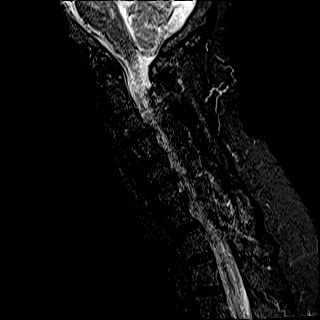
[im 12/15]
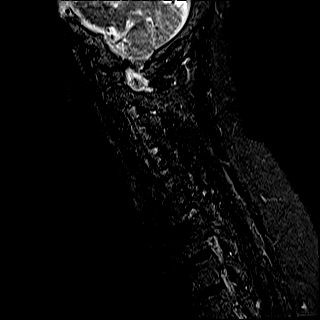
[im 15/15]
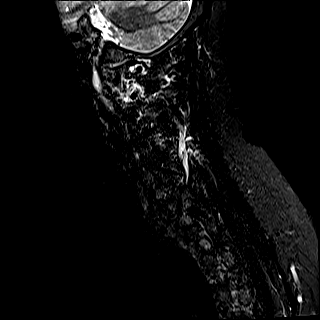

[Series 8: T2 · axial · 3.0mm · 0.70mm/px · z∈[-95,-1]mm · 9 of 29 slices shown (2 of 2)]
[im 1/29]
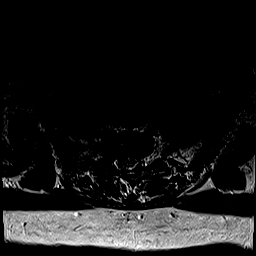
[im 3/29]
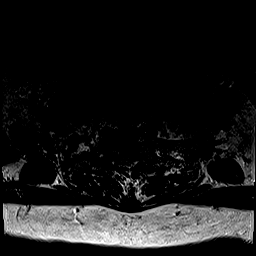
[im 5/29]
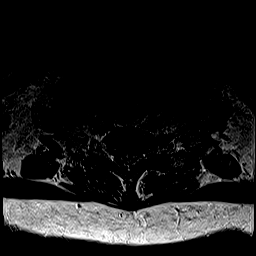
[im 9/29]
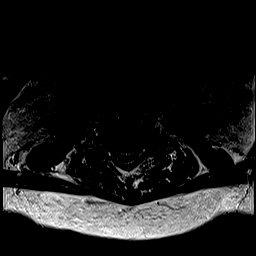
[im 13/29]
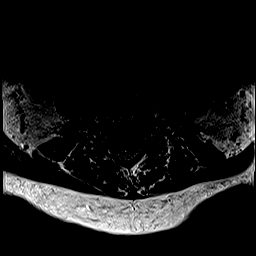
[im 16/29]
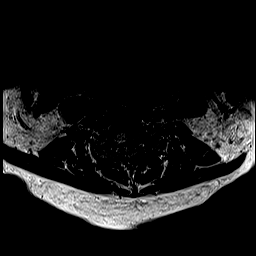
[im 20/29]
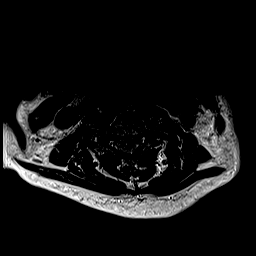
[im 24/29]
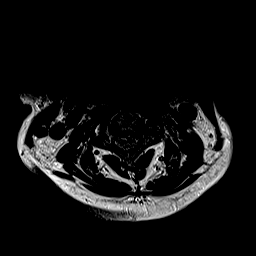
[im 29/29]
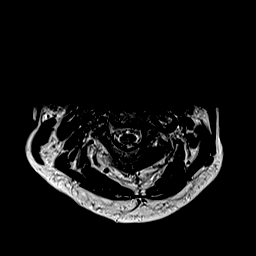

[Series 9: ax mpgr · axial · 3.0mm · 0.35mm/px · z∈[-95,-1]mm · 8 of 29 slices shown]
[im 1/29]
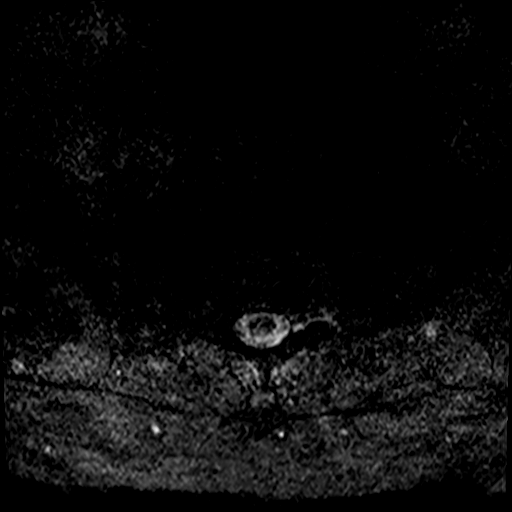
[im 5/29]
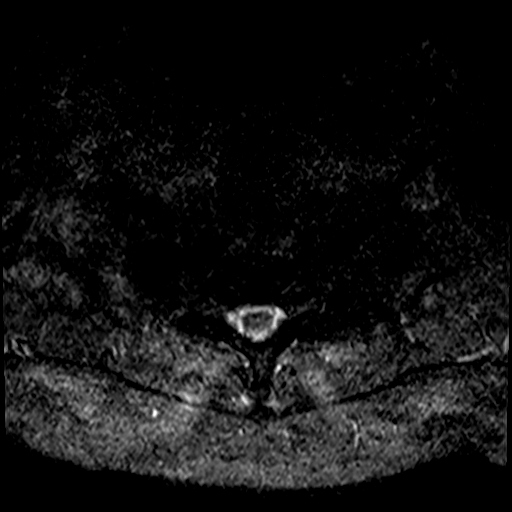
[im 9/29]
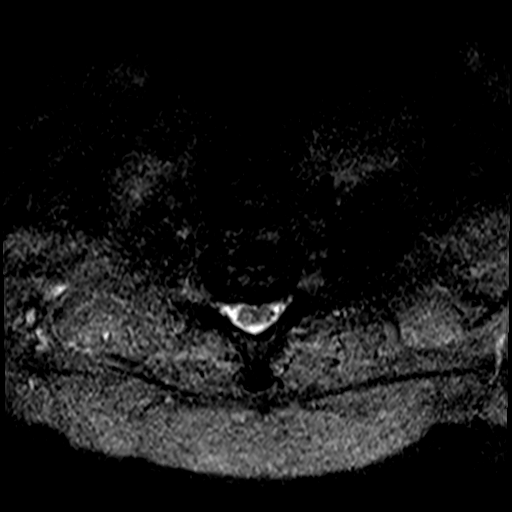
[im 13/29]
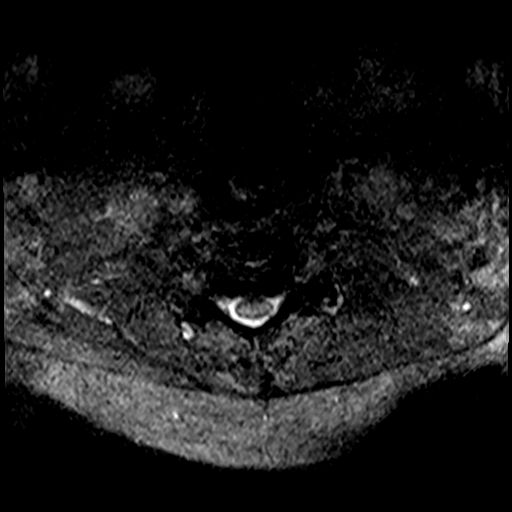
[im 16/29]
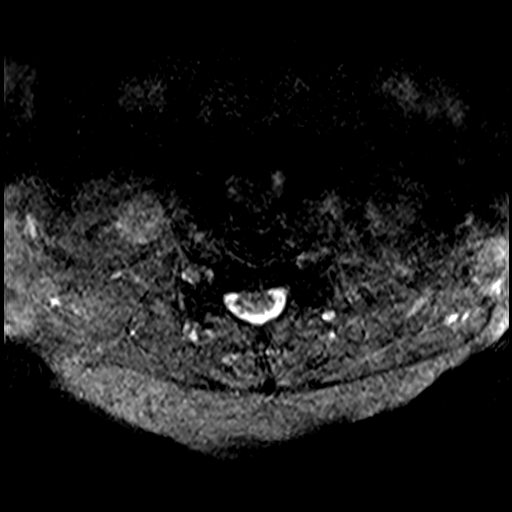
[im 20/29]
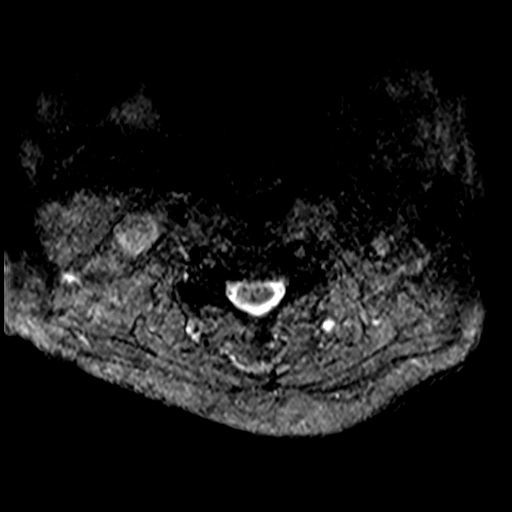
[im 24/29]
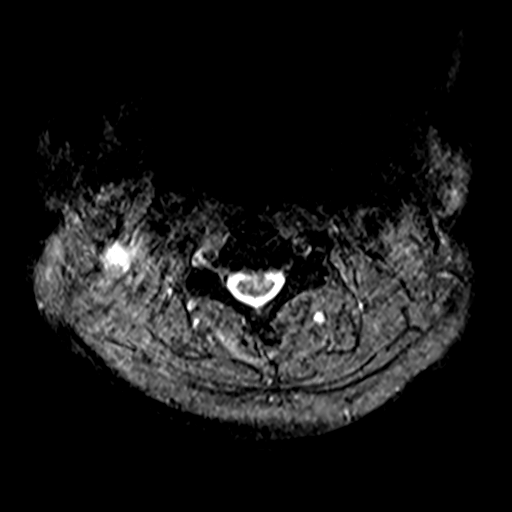
[im 29/29]
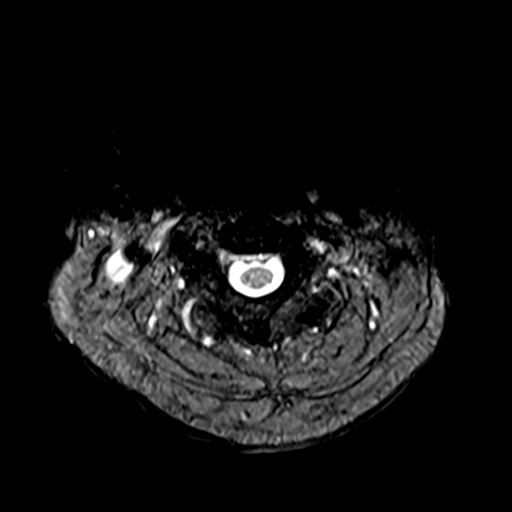

[37 of 48 positions shown; findings below may reference images not displayed]

FINDINGS: Evaluation is somewhat limited by motion artifact.

Alignment: Straightening and mild reversal of the normal cervical
lordosis. No listhesis.

Vertebrae: No acute fracture or suspicious osseous lesion. T1 and T2
hyperintense focus in the C5 vertebral body, most likely a benign
hemangioma. Vertebral body heights are preserved. Congenitally short
pedicles, which narrow the AP diameter of the spinal canal.

Cord: Normal signal and morphology. Previously noted prominence of
the central canal at the level of C7 is no longer seen.

Posterior Fossa, vertebral arteries, paraspinal tissues: Negative.

Disc levels:

C2-C3: No significant disc bulge. No spinal canal stenosis or neural
foraminal narrowing.

C3-C4: Minimal disc bulge. Facet and uncovertebral hypertrophy,
left-greater-than-right. No spinal canal stenosis. Mild-to-moderate
right and severe left neural foraminal narrowing, which have
progressed from the prior MRI .

C4-C5: No significant disc bulge. Facet and uncovertebral
hypertrophy. Moderate left and mild right neural foraminal
narrowing, which have progressed from the prior exam.

C5-C6: Minimal disc bulge. Ligamentum flavum hypertrophy. Facet and
uncovertebral hypertrophy. Mild spinal canal stenosis, with severe
right and moderate to severe left neural foraminal narrowing, which
have progressed from the prior exam.

C6-C7: Disc height loss and minimal disc bulge. Facet and
uncovertebral hypertrophy. Ligamentum flavum hypertrophy. Mild
spinal canal stenosis overall unchanged. Mild-to-moderate right and
severe left neural foraminal narrowing, which have progressed from
prior exam.

C7-T1: No significant disc bulge. No spinal canal stenosis or neural
foraminal narrowing.
IMPRESSION: 1. Evaluation is limited by motion artifact. Within this limitation,
there progressive degenerative changes compared to 4778, with mild
spinal canal stenosis C4-C5 and C5-C6.
2. Multilevel facet and uncovertebral hypertrophy, which causes up
to severe neural foraminal narrowing, which is seen on the left at
C3-C4 and C6-C7 and on the right at C5-C6. Additional less
significant neural foraminal narrowing, as described above.

## 2022-10-20 ENCOUNTER — Other Ambulatory Visit: Payer: Self-pay | Admitting: Family Medicine

## 2022-10-20 NOTE — Telephone Encounter (Signed)
Requested medication (s) are due for refill today - yes  Requested medication (s) are on the active medication list -yes  Future visit scheduled -no  Last refill: 04/09/22 #14  Notes to clinic: non delegated Rx- (original Rx marked as print)  Requested Prescriptions  Pending Prescriptions Disp Refills   butalbital-acetaminophen-caffeine (FIORICET) 50-325-40 MG tablet [Pharmacy Med Name: BUTALBITAL-APAP-CAFFEINE 50-325-40] 30 tablet     Sig: TAKE 1 TABLET BY MOUTH EVERY 6 HOURS AS NEEDED FOR PAIN     Not Delegated - Analgesics:  Non-Opioid Analgesic Combinations 2 Failed - 10/20/2022  1:57 PM      Failed - This refill cannot be delegated      Failed - Cr in normal range and within 360 days    Creatinine  Date Value Ref Range Status  02/02/2015 0.70 mg/dL Final    Comment:    0.44-1.00 NOTE: New Reference Range  12/23/14    Creatinine, Ser  Date Value Ref Range Status  10/05/2022 1.04 (H) 0.44 - 1.00 mg/dL Final         Passed - eGFR is 10 or above and within 360 days    EGFR (African American)  Date Value Ref Range Status  02/02/2015 >60  Final   GFR calc Af Amer  Date Value Ref Range Status  06/26/2020 >60 >60 mL/min Final   EGFR (Non-African Amer.)  Date Value Ref Range Status  02/02/2015 >60  Final    Comment:    eGFR values <46m/min/1.73 m2 may be an indication of chronic kidney disease (CKD). Calculated eGFR is useful in patients with stable renal function. The eGFR calculation will not be reliable in acutely ill patients when serum creatinine is changing rapidly. It is not useful in patients on dialysis. The eGFR calculation may not be applicable to patients at the low and high extremes of body sizes, pregnant women, and vegetarians.    GFR, Estimated  Date Value Ref Range Status  10/05/2022 60 (L) >60 mL/min Final    Comment:    (NOTE) Calculated using the CKD-EPI Creatinine Equation (2021)    eGFR  Date Value Ref Range Status  04/14/2022 80 >59  mL/min/1.73 Final         Passed - Patient is not pregnant      Passed - Valid encounter within last 12 months    Recent Outpatient Visits           1 month ago [facility] both eyes   [facility]   5 months ago [facility]   6 months [facility] exacerb[facility]   [facility]   1 year ago Need for immunization against influenza   [facility]   1 year ago [facility] exacerbation ([facility]   [facility]                 Requested Prescriptions  Pending Prescriptions Disp Refills   butalbital-acetaminophen-caffeine (FIORICET) 50-325-40 MG tablet [Pharmacy Med Name: BUTALBITAL-APAP-CAFFEINE 50-325-40] 30 tablet     Sig: TAKE 1 TABLET BY MOUTH EVERY 6 HOURS AS NEEDED FOR PAIN     Not Delegated - Analgesics:  Non-Opioid Analgesic Combinations 2 Failed - 10/20/2022  1:57 PM      Failed - This refill cannot be delegated      Failed - Cr in normal range  and within 360 days    Creatinine  Date Value Ref Range Status  02/02/2015 0.70 mg/dL Final    Comment:    0.44-1.00 NOTE: New Reference Range  12/23/14    Creatinine, Ser  Date Value Ref Range Status  10/05/2022 1.04 (H) 0.44 - 1.00 mg/dL Final         Passed - eGFR is 10 or above and within 360 days    EGFR (African American)  Date Value Ref Range Status  02/02/2015 >60  Final   GFR calc Af Amer  Date Value Ref Range Status  06/26/2020 >60 >60 mL/min Final   EGFR (Non-African Amer.)  Date Value Ref Range Status  02/02/2015 >60  Final    Comment:    eGFR values <43m/min/1.73 m2 may be an indication of chronic kidney disease (CKD). Calculated eGFR is useful in patients with stable renal function. The eGFR calculation will not be reliable in acutely ill patients when serum creatinine is  changing rapidly. It is not useful in patients on dialysis. The eGFR calculation may not be applicable to patients at the low and high extremes of body sizes, pregnant women, and vegetarians.    GFR, Estimated  Date Value Ref Range Status  10/05/2022 60 (L) >60 mL/min Final    Comment:    (NOTE) Calculated using the CKD-EPI Creatinine Equation (2021)    eGFR  Date Value Ref Range Status  04/14/2022 80 >59 mL/min/1.73 Final         Passed - Patient is not pregnant      Passed - Valid encounter within last 12 months    Recent Outpatient Visits           1 month ago Chronic bacterial conjunctivitis of both eyes   [facility]   5 months ago [facility] discharge follow-[facility]   [facility]   6 months ago COPD exacerbation ([facility]   [facility]   1 year ago Need for immunization against influenza   [facility]   1 year ago COPD exacerbation ([facility]   [facility]

## 2022-10-21 ENCOUNTER — Telehealth: Payer: Self-pay | Admitting: Family Medicine

## 2022-10-21 IMAGING — CT CT ANGIO HEAD-NECK (W OR W/O PERF)
2 of 11 series · 6 of 36 positions shown · IV contrast (APPLIED)
Comparison: CT head 04/01/2022

CLINICAL DATA: Acute neuro deficit.

EXAM:
CT ANGIOGRAPHY HEAD AND NECK
TECHNIQUE: Multidetector CT imaging of the head and neck was performed using
the standard protocol during bolus administration of intravenous
contrast. Multiplanar CT image reconstructions and MIPs were
obtained to evaluate the vascular anatomy. Carotid stenosis
measurements (when applicable) are obtained utilizing NASCET
criteria, using the distal internal carotid diameter as the
denominator.

[Series 10: ax thin · axial · 0.48mm/px · z∈[-232,+0]mm · 5 of 350 slices shown]
[im 59/350  soft-tissue]
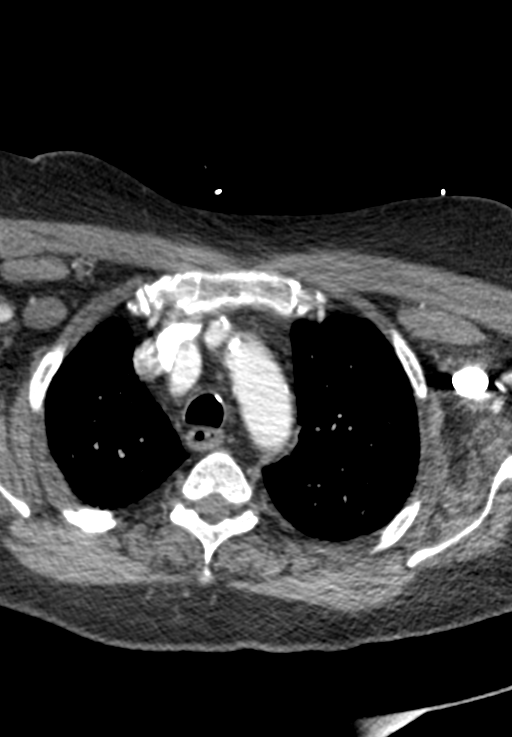
[im 117/350  bone]
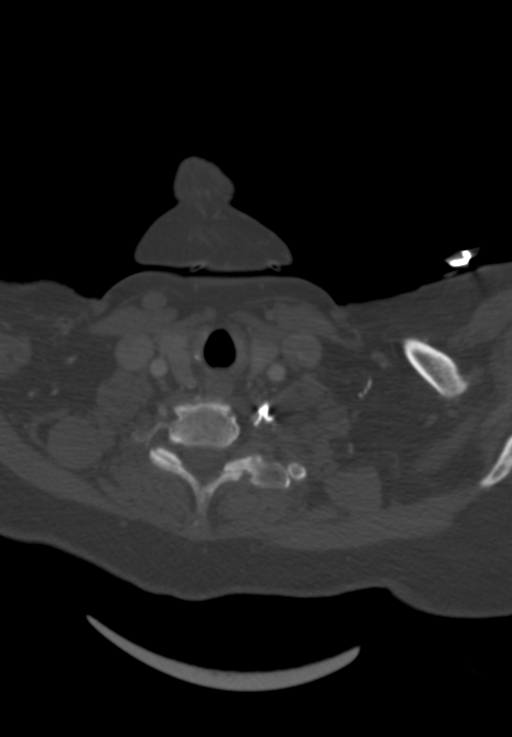
[im 175/350  soft-tissue]
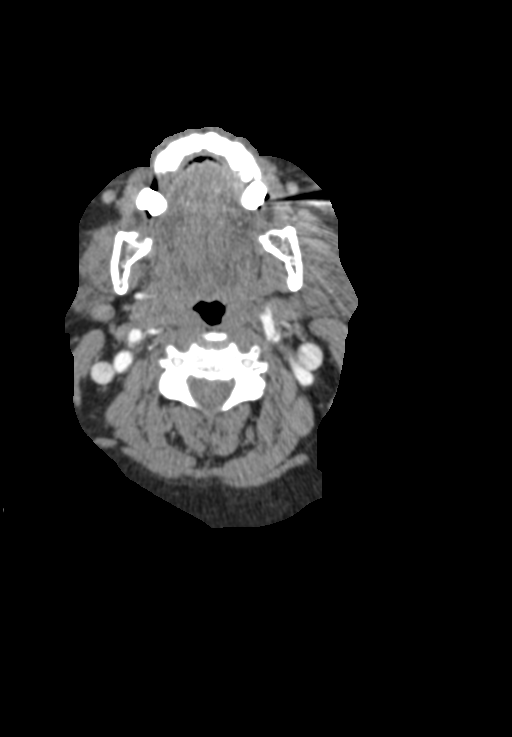
[im 233/350  bone]
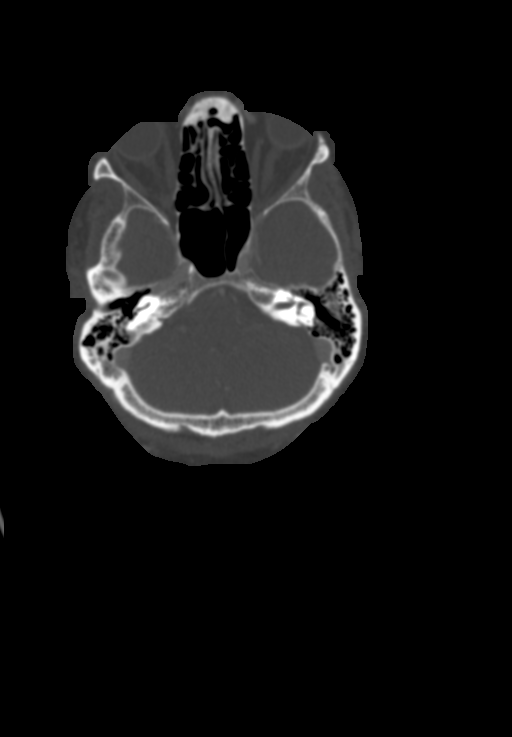
[im 291/350  soft-tissue]
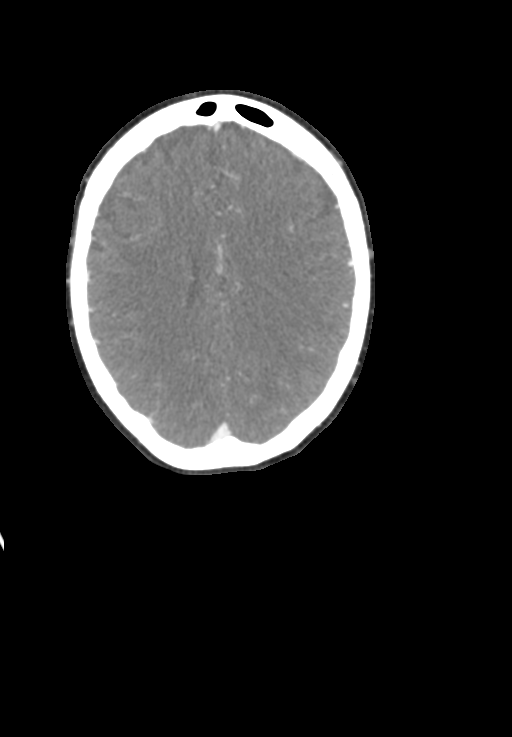

[Series 12: sagittal thin · sagittal · 0.55mm/px · 1 of 499 slices shown]
[im 183/499  soft-tissue]
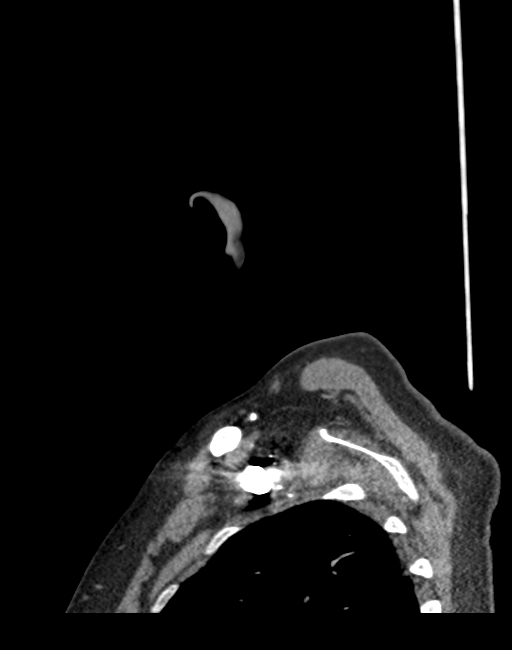

[6 of 36 positions shown; findings below may reference images not displayed]

RADIATION DOSE REDUCTION: This exam was performed according to the
departmental dose-optimization program which includes automated
exposure control, adjustment of the mA and/or kV according to
patient size and/or use of iterative reconstruction technique.

CONTRAST:  75mL OMNIPAQUE IOHEXOL 350 MG/ML SOLN
FINDINGS: CT HEAD FINDINGS

Brain: Ventricle size normal. Hypodensity in the frontal white
matter bilaterally unchanged.

Negative for acute infarct, acute hemorrhage, or mass.

Vascular: Negative for hyperdense vessel

Skull: Negative

Sinuses: Paranasal sinuses clear.

Orbits: Bilateral cataract extraction.

Review of the MIP images confirms the above findings

CTA NECK FINDINGS

Aortic arch: Standard branching. Imaged portion shows no evidence of
aneurysm or dissection. No significant stenosis of the major arch
vessel origins. Mild atherosclerotic disease aortic arch and
proximal great vessels

Right carotid system: Mild atherosclerotic disease at the right
carotid bifurcation without stenosis.

Left carotid system: Mild atherosclerotic calcification left carotid
bifurcation without stenosis

Vertebral arteries: Both vertebral arteries are widely patent to the
skull base without stenosis

Skeleton: Mild cervical spondylosis. No acute skeletal abnormality.
Periapical lucency around right upper molar.

Other neck: Negative for mass or adenopathy

Upper chest: Mild apical emphysema.  No acute abnormality

Review of the MIP images confirms the above findings

CTA HEAD FINDINGS

Anterior circulation: Atherosclerotic calcification and mild
stenosis in the cavernous carotid bilaterally. Anterior and middle
cerebral arteries widely patent without stenosis or aneurysm.

Posterior circulation: Both vertebral arteries patent to the
basilar. PICA patent bilaterally. Basilar widely patent. AICA,
superior cerebellar, posterior cerebral arteries patent. No stenosis
or aneurysm. Fetal origin posterior cerebral artery bilaterally.

Venous sinuses: Normal venous enhancement

Anatomic variants: None

Review of the MIP images confirms the above findings
IMPRESSION: 1. CT head negative for acute abnormality. Chronic white matter
changes bilaterally due to chronic ischemia.
2. Mild atherosclerotic disease carotid bifurcation bilaterally. No
carotid or vertebral artery stenosis in the neck. Mild
atherosclerotic stenosis in the cavernous carotid bilaterally.
3. No intracranial large vessel occlusion or flow limiting stenosis

## 2022-10-21 NOTE — Telephone Encounter (Signed)
Left message for patient to call back and schedule Medicare Annual Wellness Visit (AWV) in office.   If not able to come in office, please offer to do virtually or by telephone.  Left office number and my jabber #[phone].  Last AWV:09/20/2021   Please schedule at anytime with Nurse Health Advisor.  

## 2022-10-22 DIAGNOSIS — J9610 Chronic respiratory failure, unspecified whether with hypoxia or hypercapnia: Secondary | ICD-10-CM | POA: Diagnosis not present

## 2022-10-22 DIAGNOSIS — J449 Chronic obstructive pulmonary disease, unspecified: Secondary | ICD-10-CM | POA: Diagnosis not present

## 2022-10-23 NOTE — Progress Notes (Unsigned)
PROVIDER NOTE: Information contained herein reflects review and annotations entered in association with encounter. Interpretation of such information and data should be left to medically-trained personnel. Information provided to patient can be located elsewhere in the medical record under "Patient Instructions". Document created using STT-dictation technology, any transcriptional errors that may result from process are unintentional.    Patient: Theresa [name]  Service Category: E/M  Provider: Gaspar Cola, MD  DOB: 1956/03/09  DOS: 10/26/2022  Referring Provider: Birdie Sons, MD  MRN: [id number]  Specialty: Interventional Pain Management  PCP: Birdie Sons, MD  Type: Established Patient  Setting: Ambulatory outpatient    Location: Office  Delivery: Face-to-face     HPI  [name], a 67 y.o. year old female, is here today because of her No primary diagnosis found.. [name] primary complain today is No chief complaint on file. Last encounter: My last encounter with her was on 10/04/2022. Pertinent problems: [name] has Neuritis or radiculitis due to rupture of lumbar intervertebral disc; DDD (degenerative disc disease), lumbar; Carpal tunnel syndrome; Neurogenic pain; Neuropathic pain; Musculoskeletal pain; Lumbar spondylosis (L4-5); Chronic low back pain (1ry area of Pain) (Bilateral) (R>L) w/o sciatica; Chronic lower extremity pain (2ry area of Pain) (Left); Chronic lumbar radicular pain (L4 & S1 Dermatome) (Left); Chronic shoulder pain (Right side); Chronic upper extremity pain (Right-sided); Cervical radiculitis (Right side); Abnormal x-ray of lumbar spine; Chronic pain syndrome (significant psychosocial component); 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; 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); Lumbar facet syndrome (Bilateral) (R>L); Chronic sacroiliac joint pain (Bilateral); Lumbar facet hypertrophy (multilevel); Lumbar spinal stenosis  (L4-5); 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); 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); Lumbosacral radiculopathy at L4; Chronic shoulder pain (Left); Arthralgia of acromioclavicular joint (Right); Acromioclavicular joint DJD (Right); Osteoarthritis of shoulder (Right); Chronic hip pain (3ry area of Pain) (Bilateral) (L>R); Chronic lower extremity pain (Bilateral) (R>L); Cervicalgia (Bilateral) (R>L); Spondylosis without myelopathy or radiculopathy, lumbosacral region; Chronic knee pain (Left); Chronic left-sided lumbar radiculopathy; Abnormal MRI, lumbar spine (07/23/2019); Lumbar disc extrusion (L2-3) (Left); Chronic shoulder pain (Bilateral); and Abnormal MRI, cervical spine on their pertinent problem list. Pain Assessment: Severity of   is reported as a  /10. Location:    / . Onset:  . Quality:  . Timing:  . Modifying factor(s):  Marland Kitchen Vitals:  vitals were not taken for this visit.  BMI: Estimated body mass index is 38.21 kg/m as calculated from the following:   Height as of 09/20/22: _0  (1.575 m).   Weight as of 10/05/22: 208 lb 14.4 oz (94.8 kg).  Reason for encounter: medication management. ***  Pharmacotherapy Assessment  Analgesic: Hydrocodone/APAP 5/325 one tablet every 8 hours (15 mg/day of hydrocodone) MME/day: 15 mg/day.   Monitoring: [place] PMP: PDMP reviewed during this encounter.       Pharmacotherapy: No side-effects or adverse reactions reported. Compliance: No problems identified. Effectiveness: Clinically acceptable.  No notes on file  No results found for: "CBDTHCR" No results found for: "D8THCCBX" No results found for: "D9THCCBX"  UDS:  Summary  Date Value Ref Range Status  10/27/2021 Note  Final    Comment:    ==================================================================== [facility] (MW) ==================================================================== Test                             Result       Flag       Units  Drug  Present  and Declared for Prescription Verification   Hydrocodone                    653          EXPECTED   ng/mg creat   Hydromorphone                  76           EXPECTED   ng/mg creat   Dihydrocodeine                 125          EXPECTED   ng/mg creat   Norhydrocodone                 625          EXPECTED   ng/mg creat    Sources of hydrocodone include scheduled prescription medications.    Hydromorphone, dihydrocodeine and norhydrocodone are expected    metabolites of hydrocodone. Hydromorphone and dihydrocodeine are    also available as scheduled prescription medications.  ==================================================================== Test                      Result    Flag   Units      Ref Range   Creatinine              72               mg/dL      >=20 ==================================================================== Declared Medications:  The flagging and interpretation on this report are based on the  following declared medications.  Unexpected results may arise from  inaccuracies in the declared medications.   **Note: The testing scope of this panel includes these medications:   Hydrocodone (Norco)   **Note: The testing scope of this panel does not include the  following reported medications:   Acetaminophen (Norco)  Albuterol (Ventolin HFA)  Albuterol (Duoneb)  Aspirin  Cholecalciferol  Fluticasone  Ipratropium (Duoneb)  Levothyroxine (Synthroid)  Potassium (Klor-Con)  Roflumilast (Daliresp)  Sertraline (Zoloft)  Simvastatin (Zocor)  Torsemide (Demadex)  Umeclidinium  Vilanterol ==================================================================== For clinical consultation, please call [phone]. ====================================================================       ROS  Constitutional: Denies any fever or chills Gastrointestinal: No reported hemesis, hematochezia, vomiting, or acute GI distress Musculoskeletal: Denies any acute onset  joint swelling, redness, loss of ROM, or weakness Neurological: No reported episodes of acute onset apraxia, aphasia, dysarthria, agnosia, amnesia, paralysis, loss of coordination, or loss of consciousness  Medication Review  Cholecalciferol, Fluticasone-Umeclidin-Vilant, HYDROcodone-acetaminophen, Oxygen-Helium, acetaminophen, albuterol, ambrisentan, amiodarone, busPIRone, butalbital-acetaminophen-caffeine, dapagliflozin propanediol, diclofenac Sodium, diltiazem, ipratropium-albuterol, metolazone, naloxone, pantoprazole, potassium chloride SA, rivaroxaban, roflumilast, sertraline, simvastatin, spironolactone, sucralfate, torsemide, and varenicline  History Review  Allergy: Ms. Vankleeck is allergic to gabapentin, codeine, and meloxicam. Drug: Ms. Rayman  reports current drug use. Alcohol:  reports no history of alcohol use. Tobacco:  reports that she quit smoking about 5 months ago. Her smoking use included cigarettes. She has a 66.00 pack-year smoking history. She has never used smokeless tobacco. Social: Ms. Bloodgood  reports that she quit smoking about 5 months ago. Her smoking use included cigarettes. She has a 66.00 pack-year smoking history. She has never used smokeless tobacco. She reports current drug use. She reports that she does not drink alcohol. Medical:  has a past medical history of Acute postoperative pain (10/03/2017), Anxiety, BiPAP (biphasic positive airway pressure) dependence, Carpal tunnel syndrome, CHF (congestive heart failure) (Post), CHF (  congestive heart failure) (HCC), Chronic generalized abdominal pain, Chronic rhinitis, COPD (chronic obstructive pulmonary disease) ([place]), DDD (degenerative disc disease), cervical, DDD (degenerative disc disease), lumbosacral, Depression, Dyspnea, Edema, Flu, Gastritis, GERD (gastroesophageal reflux disease), Hematuria, Hernia of abdominal wall (07/17/2018), Hernia, abdominal, Hypertension, Kidney stones, Low back pain, Lumbar radiculitis,  Malodorous urine, Muscle weakness, Obesity, Oxygen dependent, Pneumonia due to COVID-19 virus (02/03/2020), Renal cyst, Sensory urge incontinence, Thyroid activity decreased, Tobacco abuse, and Wheezing. Surgical: Ms. Newnam  has a past surgical history that includes Abdominal hysterectomy; Tonsillectomy; Carpal tunnel release (Left, 2012); Tubal ligation; epigastric hernia repair (N/A, 08/13/2018); Umbilical hernia repair (N/A, 08/13/2018); RIGHT/LEFT HEART CATH AND CORONARY ANGIOGRAPHY (N/A, 07/11/2019); TEE without cardioversion (N/A, 04/29/2022); Cardioversion (N/A, 04/29/2022); Esophagogastroduodenoscopy (N/A, 06/21/2022); and cataract surgery (Bilateral). Family: family history includes Alcohol abuse in her father; Breast cancer in her sister; Cancer in her brother, brother, and sister; Coronary artery disease in her mother; Heart disease in her father and mother; Kidney cancer in her brother; Lung cancer in her sister; Pneumonia in her brother; Stroke in her mother.  Laboratory Chemistry Profile   Renal Lab Results  Component Value Date   BUN 16 10/05/2022   CREATININE 1.04 (H) 10/05/2022   BCR 32 (H) 04/14/2022   GFRAA >60 06/26/2020   GFRNONAA 60 (L) 10/05/2022    Hepatic Lab Results  Component Value Date   AST 17 10/05/2022   ALT 19 10/05/2022   ALBUMIN 4.6 10/05/2022   ALKPHOS 77 10/05/2022   LIPASE 57 (H) 06/19/2022   AMMONIA 40 (H) 10/11/2019    Electrolytes Lab Results  Component Value Date   NA 139 10/05/2022   K 4.2 10/05/2022   CL 100 10/05/2022   CALCIUM 9.4 10/05/2022   MG 2.3 06/21/2022   PHOS 3.2 06/21/2022    Bone Lab Results  Component Value Date   25OHVITD1 13 (L) 09/17/2015   25OHVITD2 <1.0 09/17/2015   25OHVITD3 13 09/17/2015    Inflammation (CRP: Acute Phase) (ESR: Chronic Phase) Lab Results  Component Value Date   CRP 0.9 04/02/2022   ESRSEDRATE 17 04/06/2022   LATICACIDVEN 0.9 04/01/2022         Note: Above Lab results reviewed.  Recent  Imaging Review  DG PAIN CLINIC C-ARM 1-60 MIN NO REPORT Fluoro was used, but no Radiologist interpretation will be provided.  Please refer to "NOTES" tab for provider progress note. Note: Reviewed        Physical Exam  General appearance: Well nourished, well developed, and well hydrated. In no apparent acute distress Mental status: Alert, oriented x 3 (person, place, & time)       Respiratory: No evidence of acute respiratory distress Eyes: PERLA Vitals: There were no vitals taken for this visit. BMI: Estimated body mass index is 38.21 kg/m as calculated from the following:   Height as of 09/20/22: _0  (1.575 m).   Weight as of 10/05/22: 208 lb 14.4 oz (94.8 kg). Ideal: Patient weight not recorded  Assessment   Diagnosis Status  No diagnosis found. Controlled Controlled Controlled   Updated Problems: No problems updated.  Plan of Care  Problem-specific:  No problem-specific Assessment & Plan notes found for this encounter.  Ms. Madelaine Whipple has a current medication list which includes the following long-term medication(s): albuterol, amiodarone, hydrocodone-acetaminophen, hydrocodone-acetaminophen, hydrocodone-acetaminophen, ipratropium-albuterol, metolazone, pantoprazole, potassium chloride sa, rivaroxaban, and sertraline.  Pharmacotherapy (Medications Ordered): No orders of the defined types were placed in this encounter.  Orders:  No orders of the defined types  were placed in this encounter.  Follow-up plan:   No follow-ups on file.     Interventional Therapies  Risk  Complexity Considerations:   NOTE: NO further Lumbar RFA until BMI <35.   Planned  Pending:   Therapeutic right lumbar facet RFA #2 (last done on 10/03/2017)    Under consideration:   Possible left lumbar facet RFA #2 (last done on 2018)  Diagnostic bilateral cervical facet MBB #1  Palliative left L2-3 LESI #4    Completed:   Palliative right lumbar facet MBB x7 (09/20/2022)  (100/100/100x5days/0)  Palliative left lumbar facet MBB x6 (11/11/2021) (100/100/0/0)  Therapeutic left L2-3 LESI x3 (09/23/2021) (80/80/80)  Diagnostic/palliative left L4 TFESI x1 (12/26/2017) (100/100/50/>50)  Diagnostic/palliative left L4-5 LESI x2 (07/30/2019) (100/100/0/0)  Diagnostic/palliative right L4 TFESI x1 (01/25/2018) (100/100/50/50)  Diagnostic/palliative right L4-5 LESI x1 (01/25/2018) (100/100/50/50)  Palliative left lumbar facet RFA x1 (06/27/2017) (0/0/100/>50)  Palliative right lumbar facet RFA x1 (10/03/2017) (100/100/75)    Therapeutic  Palliative (PRN) options:   Therapeutic lumbar facet MBB   Therapeutic left L2 LESI  Therapeutic lumbar facet RFA         Recent Visits Date Type Provider Dept  10/04/22 Office Visit Milinda Pointer, MD [facility]  09/20/22 Procedure visit Milinda Pointer, MD [facility]  08/03/22 Office Visit Milinda Pointer, MD [facility]  Showing recent visits within past 90 days and meeting all other requirements Future Appointments Date Type Provider Dept  10/26/22 Appointment Milinda Pointer, MD [facility]  Showing future appointments within next 90 days and meeting all other requirements  I discussed the assessment and treatment plan with the patient. The patient was provided an opportunity to ask questions and all were answered. The patient agreed with the plan and demonstrated an understanding of the instructions.  Patient advised to call back or seek an in-person evaluation if the symptoms or condition worsens.  Duration of encounter: *** minutes.  Total time on encounter, as per AMA guidelines included both the face-to-face and non-face-to-face time personally spent by the physician and/or other qualified health care professional(s) on the day of the encounter (includes time in activities that require the physician or other qualified health care professional and does not include  time in activities normally performed by clinical staff). Physician's time may include the following activities when performed: Preparing to see the patient (e.g., pre-charting review of records, searching for previously ordered imaging, lab work, and nerve conduction tests) Review of prior analgesic pharmacotherapies. Reviewing PMP Interpreting ordered tests (e.g., lab work, imaging, nerve conduction tests) Performing post-procedure evaluations, including interpretation of diagnostic procedures Obtaining and/or reviewing separately obtained history Performing a medically appropriate examination and/or evaluation Counseling and educating the patient/family/caregiver Ordering medications, tests, or procedures Referring and communicating with other health care professionals (when not separately reported) Documenting clinical information in the electronic or other health record Independently interpreting results (not separately reported) and communicating results to the patient/ family/caregiver Care coordination (not separately reported)  Note by: Gaspar Cola, MD Date: 10/26/2022; Time: 6:31 PM

## 2022-10-24 NOTE — Telephone Encounter (Signed)
Spoke with patient and she will call Dr Marianna Payment office to prompt them to answer whether or not she can stop her blood thinner.

## 2022-10-25 ENCOUNTER — Telehealth: Payer: Self-pay

## 2022-10-25 NOTE — Telephone Encounter (Signed)
Refaxed clearance to stop Xarelto to Dr Clayborn Bigness.  Notified patient that we were waiting onc learance.  Dr Marianna Payment office said they never got the form for clearance. Resent it.

## 2022-10-26 ENCOUNTER — Ambulatory Visit: Payer: Medicare HMO | Attending: Pain Medicine | Admitting: Pain Medicine

## 2022-10-26 ENCOUNTER — Encounter: Payer: Self-pay | Admitting: Pain Medicine

## 2022-10-26 VITALS — BP 117/62 | HR 87 | Temp 97.0°F | Ht 62.0 in | Wt 205.0 lb

## 2022-10-26 DIAGNOSIS — M79604 Pain in right leg: Secondary | ICD-10-CM | POA: Insufficient documentation

## 2022-10-26 DIAGNOSIS — M79605 Pain in left leg: Secondary | ICD-10-CM | POA: Diagnosis not present

## 2022-10-26 DIAGNOSIS — G8929 Other chronic pain: Secondary | ICD-10-CM | POA: Insufficient documentation

## 2022-10-26 DIAGNOSIS — M47816 Spondylosis without myelopathy or radiculopathy, lumbar region: Secondary | ICD-10-CM | POA: Diagnosis not present

## 2022-10-26 DIAGNOSIS — M5136 Other intervertebral disc degeneration, lumbar region: Secondary | ICD-10-CM

## 2022-10-26 DIAGNOSIS — Z79891 Long term (current) use of opiate analgesic: Secondary | ICD-10-CM | POA: Diagnosis not present

## 2022-10-26 DIAGNOSIS — G894 Chronic pain syndrome: Secondary | ICD-10-CM

## 2022-10-26 DIAGNOSIS — Z79899 Other long term (current) drug therapy: Secondary | ICD-10-CM

## 2022-10-26 DIAGNOSIS — M25551 Pain in right hip: Secondary | ICD-10-CM

## 2022-10-26 DIAGNOSIS — M25552 Pain in left hip: Secondary | ICD-10-CM

## 2022-10-26 DIAGNOSIS — M25562 Pain in left knee: Secondary | ICD-10-CM | POA: Insufficient documentation

## 2022-10-26 DIAGNOSIS — M545 Low back pain, unspecified: Secondary | ICD-10-CM | POA: Diagnosis not present

## 2022-10-26 DIAGNOSIS — Z7901 Long term (current) use of anticoagulants: Secondary | ICD-10-CM | POA: Insufficient documentation

## 2022-10-26 MED ORDER — HYDROCODONE-ACETAMINOPHEN 5-325 MG PO TABS: 1.0000 | ORAL_TABLET | Freq: Three times a day (TID) | ORAL | 0 refills | Status: DC | PRN

## 2022-10-26 NOTE — Telephone Encounter (Signed)
Do we have any updates on this clearance?

## 2022-10-26 NOTE — Telephone Encounter (Signed)
Multiple faxes have been sent to Dr. Etta Quill office, as well as a message in EPIC, with no response. I contacted patient, she will contact Dr. Etta Quill office in attempt to obtain clearance to stop Xarelto for procedure.

## 2022-10-26 NOTE — Patient Instructions (Signed)
____________________________________________________________________________________________  Patient Information update  To: All of our patients.  Re: Name change.  It has been made official that our current name, "[place]"   will soon be changed to "[facility]".   The purpose of this change is to eliminate any confusion created by the concept of our practice being a "Medication Management Pain Clinic". In the past this has led to the misconception that we treat pain primarily by the use of prescription medications.  Nothing can be farther from the truth.   Understanding PAIN MANAGEMENT: To further understand what our practice does, you first have to understand that "Pain Management" is a subspecialty that requires additional training once a physician has completed their specialty training, which can be in either Anesthesia, Neurology, Psychiatry, or Physical Medicine and Rehabilitation (PMR). Each one of these contributes to the final approach taken by each physician to the management of their patient's pain. To be a "Pain Management Specialist" you must have first completed one of the specialty trainings below.  Anesthesiologists - trained in clinical pharmacology and interventional techniques such as nerve blockade and regional as well as central neuroanatomy. They are trained to block pain before, during, and after surgical interventions.  Neurologists - trained in the diagnosis and pharmacological treatment of complex neurological conditions, such as Multiple Sclerosis, Parkinson's, spinal cord injuries, and other systemic conditions that may be associated with symptoms that may include but are not limited to pain. They tend to rely primarily on the treatment of chronic pain using prescription medications.  Psychiatrist - trained in conditions affecting the psychosocial  wellbeing of patients including but not limited to depression, anxiety, schizophrenia, personality disorders, addiction, and other substance use disorders that may be associated with chronic pain. They tend to rely primarily on the treatment of chronic pain using prescription medications.   Physical Medicine and Rehabilitation (PMR) physicians, also known as physiatrists - trained to treat a wide variety of medical conditions affecting the brain, spinal cord, nerves, bones, joints, ligaments, muscles, and tendons. Their training is primarily aimed at treating patients that have suffered injuries that have caused severe physical impairment. Their training is primarily aimed at the physical therapy and rehabilitation of those patients. They may also work alongside orthopedic surgeons or neurosurgeons using their expertise in assisting surgical patients to recover after their surgeries.  INTERVENTIONAL PAIN MANAGEMENT is sub-subspecialty of Pain Management.  Our physicians are Board-certified in Anesthesia, Pain Management, and Interventional Pain Management.  This meaning that not only have they been trained and Board-certified in their specialty of Anesthesia, and subspecialty of Pain Management, but they have also received further training in the sub-subspecialty of Interventional Pain Management, in order to become Board-certified as INTERVENTIONAL PAIN MANAGEMENT SPECIALIST.    Mission: Our goal is to use our skills in  [place] as alternatives to the chronic use of prescription opioid medications for the treatment of pain. To make this more clear, we have changed our name to reflect what we do and offer. We will continue to offer medication management assessment and recommendations, but we will not be taking over any patient's medication management.  ____________________________________________________________________________________________      ____________________________________________________________________________________________  National Pain Medication Shortage  The [place] is experiencing worsening drug shortages. These have had a negative widespread effect on patient care and treatment. Not expected to improve any time soon. Predicted to last past 2029.   Drug shortage list (generic  names) Oxycodone IR Oxycodone/APAP Oxymorphone IR Hydromorphone Hydrocodone/APAP Morphine  Where is the problem?  Manufacturing and supply level.  Will this shortage affect you?  Only if you take any of the above pain medications.  How? You may be unable to fill your prescription.  Your pharmacist may offer a "partial fill" of your prescription. (Warning: Do not accept partial fills.) Prescriptions partially filled cannot be transferred to another pharmacy. Read our Medication Rules and Regulation. Depending on how much medicine you are dependent on, you may experience withdrawals when unable to get the medication.  Recommendations: Consider ending your dependence on opioid pain medications. Ask your pain specialist to assist you with the process. Consider switching to a medication currently not in shortage, such as Buprenorphine. Talk to your pain specialist about this option. Consider decreasing your pain medication requirements by managing tolerance thru "Drug Holidays". This may help minimize withdrawals, should you run out of medicine. Control your pain thru the use of non-pharmacological interventional therapies.   Your prescriber: Prescribers cannot be blamed for shortages. Medication manufacturing and supply issues cannot be fixed by the prescriber.   NOTE: The prescriber is not responsible for supplying the medication, or solving supply issues. Work with your pharmacist to solve it. The patient is responsible for the decision to take or continue taking the medication and for identifying and securing a legal supply source. By  law, supplying the medication is the job and responsibility of the pharmacy. The prescriber is responsible for the evaluation, monitoring, and prescribing of these medications.   Prescribers will NOT: Re-issue prescriptions that have been partially filled. Re-issue prescriptions already sent to a pharmacy.  Re-send prescriptions to a different pharmacy because yours did not have your medication. Ask pharmacist to order more medicine or transfer the prescription to another pharmacy. (Read below.)  New 2023 regulation: "June 17, 2022 Revised Regulation Allows DEA-Registered Pharmacies to Transfer Electronic Prescriptions at a Patient's Request [place] Patients now have the ability to request their electronic prescription be transferred to another pharmacy without having to go back to their practitioner to initiate the request. This revised regulation went into effect on Monday, June 13, 2022.     At a patient's request, a DEA-registered retail pharmacy can now transfer an electronic prescription for a controlled substance (schedules II-V) to another DEA-registered retail pharmacy. Prior to this change, patients would have to go through their practitioner to cancel their prescription and have it re-issued to a different pharmacy. The process was taxing and time consuming for both patients and practitioners.    The Drug Enforcement Administration [facility]) published its intent to revise the process for transferring electronic prescriptions on September 04, 2020.  The final rule was published in the federal register on May 12, 2022 and went into effect 30 days later.  Under the final rule, a prescription can only be transferred once between pharmacies, and only if allowed under existing state or other applicable law. The prescription must remain in its electronic form; may not be altered in any way; and the transfer must be communicated directly between  two licensed pharmacists. It's important to note, any authorized refills transfer with the original prescription, which means the entire prescription will be filled at the same pharmacy".  Reference: [email or web address] [facility] website announcement)  [email or web address] ([place])   [employer] / Vol. 88, No. 143 / Thursday, May 12, 2022 / Rules and Regulations DEPARTMENT OF JUSTICE  Drug Enforcement  Administration  21 CFR Part 1306  [Docket No. [id number]]  RIN 1117-AB64 Transfer of Electronic Prescriptions for Schedules II-V Controlled Substances Between Pharmacies for Initial Filling  ____________________________________________________________________________________________     _______________________________________________________________________  Medication Rules  Purpose: To inform patients, and their family members, of our medication rules and regulations.  Applies to: All patients receiving prescriptions from our practice (written or electronic).  Pharmacy of record: This is the pharmacy where your electronic prescriptions will be sent. Make sure we have the correct one.  Electronic prescriptions: In compliance with the [place] (STOP) Act of 2017 (Session Lanny Cramp [phone]), effective October 17, 2018, all controlled substances must be electronically prescribed. Written prescriptions, faxing, or calling prescriptions to a pharmacy will no longer be done.  Prescription refills: These will be provided only during in-person appointments. No medications will be renewed without a "face-to-face" evaluation with your provider. Applies to all prescriptions.  NOTE: The following applies primarily to controlled substances (Opioid* Pain Medications).   Type of encounter  (visit): For patients receiving controlled substances, face-to-face visits are required. (Not an option and not up to the patient.)  Patient's responsibilities: Pain Pills: Bring all pain pills to every appointment (except for procedure appointments). Pill Bottles: Bring pills in original pharmacy bottle. Bring bottle, even if empty. Always bring the bottle of the most recent fill.  Medication refills: You are responsible for knowing and keeping track of what medications you are taking and when is it that you will need a refill. The day before your appointment: write a list of all prescriptions that need to be refilled. The day of the appointment: give the list to the admitting nurse. Prescriptions will be written only during appointments. No prescriptions will be written on procedure days. If you forget a medication: it will not be "Called in", "Faxed", or "electronically sent". You will need to get another appointment to get these prescribed. No early refills. Do not call asking to have your prescription filled early. Partial  or short prescriptions: Occasionally your pharmacy may not have enough pills to fill your prescription.  NEVER ACCEPT a partial fill or a prescription that is short of the total amount of pills that you were prescribed.  With controlled substances the law allows 72 hours for the pharmacy to complete the prescription.  If the prescription is not completed within 72 hours, the pharmacist will require a new prescription to be written. This means that you will be short on your medicine and we WILL NOT send another prescription to complete your original prescription.  Instead, request the pharmacy to send a carrier to a nearby branch to get enough medication to provide you with your full prescription. Prescription Accuracy: You are responsible for carefully inspecting your prescriptions before leaving our office. Have the discharge nurse carefully go over each prescription with you,  before taking them home. Make sure that your name is accurately spelled, that your address is correct. Check the name and dose of your medication to make sure it is accurate. Check the number of pills, and the written instructions to make sure they are clear and accurate. Make sure that you are given enough medication to last until your next medication refill appointment. Taking Medication: Take medication as prescribed. When it comes to controlled substances, taking less pills or less frequently than prescribed is permitted and encouraged. Never take more pills than instructed. Never take the medication more frequently than prescribed.  Inform other Doctors: Always inform, all of  your healthcare providers, of all the medications you take. Pain Medication from other Providers: You are not allowed to accept any additional pain medication from any other Doctor or Healthcare provider. There are two exceptions to this rule. (see below) In the event that you require additional pain medication, you are responsible for notifying us, as stated below. Cough Medicine: Often these contain an opioid, such as codeine or hydrocodone. Never accept or take cough medicine containing these opioids if you are already taking an opioid* medication. The combination may cause respiratory failure and death. Medication Agreement: You are responsible for carefully reading and following our Medication Agreement. This must be signed before receiving any prescriptions from our practice. Safely store a copy of your signed Agreement. Violations to the Agreement will result in no further prescriptions. (Additional copies of our Medication Agreement are available upon request.) Laws, Rules, & Regulations: All patients are expected to follow all Federal and [employer], [employer], Rules, [employer]. Ignorance of the Laws does not constitute a valid excuse.  Illegal drugs and Controlled Substances: The use of illegal substances (including,  but not limited to marijuana and its derivatives) and/or the illegal use of any controlled substances is strictly prohibited. Violation of this rule may result in the immediate and permanent discontinuation of any and all prescriptions being written by our practice. The use of any illegal substances is prohibited. Adopted CDC guidelines & recommendations: Target dosing levels will be at or below 60 MME/day. Use of benzodiazepines** is not recommended.  Exceptions: There are only two exceptions to the rule of not receiving pain medications from other Healthcare Providers. Exception #1 (Emergencies): In the event of an emergency (i.e.: accident requiring emergency care), you are allowed to receive additional pain medication. However, you are responsible for: As soon as you are able, call our office [phone], at any time of the day or night, and leave a message stating your name, the date and nature of the emergency, and the name and dose of the medication prescribed. In the event that your call is answered by a member of our staff, make sure to document and save the date, time, and the name of the person that took your information.  Exception #2 (Planned Surgery): In the event that you are scheduled by another doctor or dentist to have any type of surgery or procedure, you are allowed (for a period no longer than 30 days), to receive additional pain medication, for the acute post-op pain. However, in this case, you are responsible for picking up a copy of our "Post-op Pain Management for Surgeons" handout, and giving it to your surgeon or dentist. This document is available at our office, and does not require an appointment to obtain it. Simply go to our office during business hours (Monday-Thursday from 8:00 AM to 4:00 PM) (Friday 8:00 AM to 12:00 Noon) or if you have a scheduled appointment with [place], prior to your surgery, and ask for it by name. In addition, you are responsible for: calling our office  [phone], at any time of the day or night, and leaving a message stating your name, name of your surgeon, type of surgery, and date of procedure or surgery. Failure to comply with your responsibilities may result in termination of therapy involving the controlled substances. Medication Agreement Violation. Following the above rules, including your responsibilities will help you in avoiding a Medication Agreement Violation ("Breaking your Pain Medication Contract").  Consequences:  Not following the above rules may result  in permanent discontinuation of medication prescription therapy.  *Opioid medications include: morphine, codeine, oxycodone, oxymorphone, hydrocodone, hydromorphone, meperidine, tramadol, tapentadol, buprenorphine, fentanyl, methadone. **Benzodiazepine medications include: diazepam (Valium), alprazolam (Xanax), clonazepam (Klonopine), lorazepam (Ativan), clorazepate (Tranxene), chlordiazepoxide (Librium), estazolam (Prosom), oxazepam (Serax), temazepam (Restoril), triazolam (Halcion) (Last updated: 08/09/2022) ______________________________________________________________________    ______________________________________________________________________  Medication Recommendations and Reminders  Applies to: All patients receiving prescriptions (written and/or electronic).  Medication Rules & Regulations: You are responsible for reading, knowing, and following our "Medication Rules" document. These exist for your safety and that of others. They are not flexible and neither are we. Dismissing or ignoring them is an act of "non-compliance" that may result in complete and irreversible termination of such medication therapy. For safety reasons, "non-compliance" will not be tolerated. As with the U[place] fundamental legal principle of "ignorance of the law is no defense", we will accept no excuses for not having read and knowing the content of documents provided to you by our  practice.  Pharmacy of record:  Definition: This is the pharmacy where your electronic prescriptions will be sent.  We do not endorse any particular pharmacy. It is up to you and your insurance to decide what pharmacy to use.  We do not restrict you in your choice of pharmacy. However, once we write for your prescriptions, we will NOT be re-sending more prescriptions to fix restricted supply problems created by your pharmacy, or your insurance.  The pharmacy listed in the electronic medical record should be the one where you want electronic prescriptions to be sent. If you choose to change pharmacy, simply notify our nursing staff. Changes will be made only during your regular appointments and not over the phone.  Recommendations: Keep all of your pain medications in a safe place, under lock and key, even if you live alone. We will NOT replace lost, stolen, or damaged medication. We do not accept "Police Reports" as proof of medications having been stolen. After you fill your prescription, take 1 week's worth of pills and put them away in a safe place. You should keep a separate, properly labeled bottle for this purpose. The remainder should be kept in the original bottle. Use this as your primary supply, until it runs out. Once it's gone, then you know that you have 1 week's worth of medicine, and it is time to come in for a prescription refill. If you do this correctly, it is unlikely that you will ever run out of medicine. To make sure that the above recommendation works, it is very important that you make sure your medication refill appointments are scheduled at least 1 week before you run out of medicine. To do this in an effective manner, make sure that you do not leave the office without scheduling your next medication management appointment. Always ask the nursing staff to show you in your prescription , when your medication will be running out. Then arrange for the receptionist to get you a  return appointment, at least 7 days before you run out of medicine. Do not wait until you have 1 or 2 pills left, to come in. This is very poor planning and does not take into consideration that we may need to cancel appointments due to bad weather, sickness, or emergencies affecting our staff. DO NOT ACCEPT A "Partial Fill": If for any reason your pharmacy does not have enough pills/tablets to completely fill or refill your prescription, do not allow for a "partial fill". The law allows the pharmacy to complete  that prescription within 72 hours, without requiring a new prescription. If they do not fill the rest of your prescription within those 72 hours, you will need a separate prescription to fill the remaining amount, which we will NOT provide. If the reason for the partial fill is your insurance, you will need to talk to the pharmacist about payment alternatives for the remaining tablets, but again, DO NOT ACCEPT A PARTIAL FILL, unless you can trust your pharmacist to obtain the remainder of the pills within 72 hours.  Prescription refills and/or changes in medication(s):  Prescription refills, and/or changes in dose or medication, will be conducted only during scheduled medication management appointments. (Applies to both, written and electronic prescriptions.) No refills on procedure days. No medication will be changed or started on procedure days. No changes, adjustments, and/or refills will be conducted on a procedure day. Doing so will interfere with the diagnostic portion of the procedure. No phone refills. No medications will be "called into the pharmacy". No Fax refills. No weekend refills. No Holliday refills. No after hours refills.  Remember:  Business hours are:  Monday to Thursday 8:00 AM to 4:00 PM Provider's Schedule: Milinda Pointer, MD - Appointments are:  Medication management: Monday and Wednesday 8:00 AM to 4:00 PM Procedure day: Tuesday and Thursday 7:30 AM to 4:00  PM Gillis Santa, MD - Appointments are:  Medication management: Tuesday and Thursday 8:00 AM to 4:00 PM Procedure day: Monday and Wednesday 7:30 AM to 4:00 PM (Last update: 08/09/2022) ______________________________________________________________________    ____________________________________________________________________________________________  Drug Holidays  What is a "Drug Holiday"? Drug Holiday: is the name given to the process of slowly tapering down and temporarily stopping the pain medication for the purpose of decreasing or eliminating tolerance to the drug.  Benefits Improved effectiveness Decreased required effective dose Improved pain control End dependence on high dose therapy Decrease cost of therapy Uncovering "opioid-induced hyperalgesia". (OIH)  What is "opioid hyperalgesia"? It is a paradoxical increase in pain caused by exposure to opioids. Stopping the opioid pain medication, contrary to the expected, it actually decreases or completely eliminates the pain. Ref.: "A comprehensive review of opioid-induced hyperalgesia". Brion Aliment, et.al. Pain Physician. 2011 Mar-Apr;14(2):145-61.  What is tolerance? Tolerance: the progressive loss of effectiveness of a pain medicine due to repetitive use. A common problem of opioid pain medications.  How long should a "Drug Holiday" last? Effectiveness depends on the patient staying off all opioid pain medicines for a minimum of 14 consecutive days. (2 weeks)  How about just taking less of the medicine? Does not work. Will not accomplish goal of eliminating the excess receptors.  How about switching to a different pain medicine? (AKA. "Opioid rotation") Does not work. Creates the illusion of effectiveness by taking advantage of inaccurate equivalent dose calculations between different opioids. -This "technique" was promoted by studies funded by [employer], such as [employer], creators of  "OxyContin".  Can I stop the medicine "cold [place]"? Depends. You should always coordinate with your Pain Specialist to make the transition as smoothly as possible. Avoid stopping the medicine abruptly without consulting. We recommend a "slow taper".  What is a slow taper? Taper: refers to the gradual decrease in dose.   How do I stop/taper the dose? Slowly. Decrease the daily amount of pills that you take by one (1) pill every seven (7) days. This is called a "slow downward taper". Example: if you normally take four (4) pills per day, drop it to three (3) pills per day  for seven (7) days, then to two (2) pills per day for seven (7) days, then to one (1) per day for seven (7) days, and then stop the medicine. The 14 day "Drug Holiday" starts on the first day without medicine.   Will I experience withdrawals? Unlikely with a slow taper.  What triggers withdrawals? Withdrawals are triggered by the sudden/abrupt stop of high dose opioids. Withdrawals can be avoided by slowly decreasing the dose over a prolonged period of time.  What are withdrawals? Symptoms associated with sudden/abrupt reduction/stopping of high-dose, long-term use of pain medication. Withdrawal are seldom seen on low dose therapy, or patients rarely taking opioid medication.  Early Withdrawal Symptoms may include: Agitation Anxiety Muscle aches Increased tearing Insomnia Runny nose Sweating Yawning  Late symptoms may include: Abdominal cramping Diarrhea Dilated pupils Goose bumps Nausea Vomiting  (Last update: 09/25/2022) ____________________________________________________________________________________________    ____________________________________________________________________________________________  WARNING: CBD (cannabidiol) & Delta (Delta-8 tetrahydrocannabinol) products.   Applicable to:  All individuals currently taking or considering taking CBD (cannabidiol) and, more important, all  patients taking opioid analgesic controlled substances (pain medication). (Example: oxycodone; oxymorphone; hydrocodone; hydromorphone; morphine; methadone; tramadol; tapentadol; fentanyl; buprenorphine; butorphanol; dextromethorphan; meperidine; codeine; etc.)  Introduction:  Recently there has been a drive towards the use of "natural" products for the treatment of different conditions, including pain anxiety and sleep disorders. Marijuana and hemp are two varieties of the cannabis genus plants. Marijuana and its derivatives are illegal, while hemp and its derivatives are not. Cannabidiol (CBD) and tetrahydrocannabinol (THC), are two natural compounds found in plants of the Cannabis genus. They can both be extracted from hemp or marijuana. Both compounds interact with your body's endocannabinoid system in very different ways. CBD is associated with pain relief (analgesia) while THC is associated with the psychoactive effects ("the high") obtained from the use of marijuana products. There are two main types of THC: Delta-9, which comes from the marijuana plant and it is illegal, and Delta-8, which comes from the hemp plant, and it is legal. (Both, Delta-9-THC and Delta-8-THC are psychoactive and give you "the high".)   Legality:  Marijuana and its derivatives: illegal Hemp and its derivatives: Legal (State dependent) UPDATE: (12/03/2021) The Drug Enforcement Agency ([place]) issued a letter stating that "delta" cannabinoids, including Delta-8-THCO and Delta-9-THCO, synthetically derived from hemp do not qualify as hemp and will be viewed as Schedule I drugs. (Schedule I drugs, substances, or chemicals are defined as drugs with no currently accepted medical use and a high potential for abuse. Some examples of Schedule I drugs are: heroin, lysergic acid diethylamide (LSD), marijuana (cannabis), 3,4-methylenedioxymethamphetamine (ecstasy), methaqualone, and peyote.) (https://jennings.com/)  Legal status of CBD in  [place]:  "Conditionally Legal"  Reference: "FDA Regulation of Cannabis and Cannabis-Derived Products, Including Cannabidiol (CBD)" - SeekArtists.com.pt  Warning:  CBD is not FDA approved and has not undergo the same manufacturing controls as prescription drugs.  This means that the purity and safety of available CBD may be questionable. Most of the time, despite manufacturer's claims, it is contaminated with THC (delta-9-tetrahydrocannabinol - the chemical in marijuana responsible for the "HIGH").  When this is the case, the [facility] contaminant will trigger a positive urine drug screen (UDS) test for Marijuana (carboxy-THC).   The FDA recently put out a warning about 5 things that everyone should be aware of regarding Delta-8 THC: Delta-8 THC products have not been evaluated or approved by the FDA for safe use and may be marketed in ways that put the public health at  risk. The FDA has received adverse event reports involving delta-8 THC-containing products. Delta-8 THC has psychoactive and intoxicating effects. Delta-8 THC manufacturing often involve use of potentially harmful chemicals to create the concentrations of delta-8 THC claimed in the marketplace. The final delta-8 THC product may have potentially harmful by-products (contaminants) due to the chemicals used in the process. Manufacturing of delta-8 THC products may occur in uncontrolled or unsanitary settings, which may lead to the presence of unsafe contaminants or other potentially harmful substances. Delta-8 THC products should be kept out of the reach of children and pets.  NOTE: Because a positive UDS for any illicit substance is a violation of our medication agreement, your opioid analgesics (pain medicine) may be permanently discontinued.  MORE ABOUT CBD  General Information: CBD was discovered in [birth year] and it is a derivative of  the cannabis sativa genus plants (Marijuana and Hemp). It is one of the 113 identified substances found in Marijuana. It accounts for up to 40% of the plant's extract. As of 2018, preliminary clinical studies on CBD included research for the treatment of anxiety, movement disorders, and pain. CBD is available and consumed in multiple forms, including inhalation of smoke or vapor, as an aerosol spray, and by mouth. It may be supplied as an oil containing CBD, capsules, dried cannabis, or as a liquid solution. CBD is thought not to be as psychoactive as THC (delta-9-tetrahydrocannabinol - the chemical in marijuana responsible for the "HIGH"). Studies suggest that CBD may interact with different biological target receptors in the body, including cannabinoid and other neurotransmitter receptors. As of 2018 the mechanism of action for its biological effects has not been determined.  Side-effects  Adverse reactions: Dry mouth, diarrhea, decreased appetite, fatigue, drowsiness, malaise, weakness, sleep disturbances, and others.  Drug interactions:  CBD may interact with medications such as blood-thinners. CBD causes drowsiness on its own and it will increase drowsiness caused by other medications, including antihistamines (such as Benadryl), benzodiazepines (Xanax, Ativan, Valium), antipsychotics, antidepressants, opioids, alcohol and supplements such as kava, melatonin and St. John's Wort.  Other drug interactions: Brivaracetam (Briviact); Caffeine; Carbamazepine (Tegretol); Citalopram (Celexa); Clobazam (Onfi); Eslicarbazepine (Aptiom); Everolimus (Zostress); Lithium; Methadone (Dolophine); Rufinamide (Banzel); Sedative medications (CNS depressants); Sirolimus (Rapamune); Stiripentol (Diacomit); Tacrolimus (Prograf); Tamoxifen ; Soltamox); Topiramate (Topamax); Valproate; Warfarin (Coumadin); Zonisamide. (Last update:  09/26/2022) ____________________________________________________________________________________________   ____________________________________________________________________________________________  Naloxone Nasal Spray  Why am I receiving this medication? [place] STOP ACT requires that all patients taking high dose opioids or at risk of opioids respiratory depression, be prescribed an opioid reversal agent, such as Naloxone (AKA: Narcan).  What is this medication? NALOXONE (nal OX one) treats opioid overdose, which causes slow or shallow breathing, severe drowsiness, or trouble staying awake. Call emergency services after using this medication. You may need additional treatment. Naloxone works by reversing the effects of opioids. It belongs to a group of medications called opioid blockers.  COMMON BRAND NAME(S): Kloxxado, Narcan  What should I tell my care team before I take this medication? They need to know if you have any of these conditions: Heart disease Substance use disorder An unusual or allergic reaction to naloxone, other medications, foods, dyes, or preservatives Pregnant or trying to get pregnant Breast-feeding  When to use this medication? This medication is to be used for the treatment of respiratory depression (less than 8 breaths per minute) secondary to opioid overdose.   How to use this medication? This medication is for use in the nose. Lay the person on their  back. Support their neck with your hand and allow the head to tilt back before giving the medication. The nasal spray should be given into 1 nostril. After giving the medication, move the person onto their side. Do not remove or test the nasal spray until ready to use. Get emergency medical help right away after giving the first dose of this medication, even if the person wakes up. You should be familiar with how to recognize the signs and symptoms of a narcotic overdose. If more doses are needed, give  the additional dose in the other nostril. Talk to your care team about the use of this medication in children. While this medication may be prescribed for children as young as newborns for selected conditions, precautions do apply.  Naloxone Overdosage: If you think you have taken too much of this medicine contact a poison control center or emergency room at once.  NOTE: This medicine is only for you. Do not share this medicine with others.  What if I miss a dose? This does not apply.  What may interact with this medication? This is only used during an emergency. No interactions are expected during emergency use. This list may not describe all possible interactions. Give your health care provider a list of all the medicines, herbs, non-prescription drugs, or dietary supplements you use. Also tell them if you smoke, drink alcohol, or use illegal drugs. Some items may interact with your medicine.  What should I watch for while using this medication? Keep this medication ready for use in the case of an opioid overdose. Make sure that you have the phone number of your care team and local hospital ready. You may need to have additional doses of this medication. Each nasal spray contains a single dose. Some emergencies may require additional doses. After use, bring the treated person to the nearest hospital or call 911. Make sure the treating care team knows that the person has received a dose of this medication. You will receive additional instructions on what to do during and after use of this medication before an emergency occurs.  What side effects may I notice from receiving this medication? Side effects that you should report to your care team as soon as possible: Allergic reactions--skin rash, itching, hives, swelling of the face, lips, tongue, or throat Side effects that usually do not require medical attention (report these to your care team if they continue or are  bothersome): Constipation Dryness or irritation inside the nose Headache Increase in blood pressure Muscle spasms Stuffy nose Toothache This list may not describe all possible side effects. Call your doctor for medical advice about side effects. You may report side effects to FDA at 1-800-FDA-1088.  Where should I keep my medication? Because this is an emergency medication, you should keep it with you at all times.  Keep out of the reach of children and pets. Store between 20 and 25 degrees C (68 and 77 degrees F). Do not freeze. Throw away any unused medication after the expiration date. Keep in original box until ready to use.  NOTE: This sheet is a summary. It may not cover all possible information. If you have questions about this medicine, talk to your doctor, pharmacist, or health care provider.   2023 Elsevier/Gold Standard (2021-06-11 00:00:00)  ____________________________________________________________________________________________

## 2022-10-26 NOTE — Progress Notes (Signed)
Nursing Pain Medication Assessment:  Safety precautions to be maintained throughout the outpatient stay will include: orient to surroundings, keep bed in low position, maintain call bell within reach at all times, provide assistance with transfer out of bed and ambulation.  Medication Inspection Compliance: Pill count conducted under aseptic conditions, in front of the patient. Neither the pills nor the bottle was removed from the patient's sight at any time. Once count was completed pills were immediately returned to the patient in their original bottle.  Medication: Hydrocodone/APAP Pill/Patch Count:  37 of 90 pills remain Pill/Patch Appearance: Markings consistent with prescribed medication Bottle Appearance: Standard pharmacy container. Clearly labeled. Filled Date: 12 /16 / 2023 Last Medication intake:  TodaySafety precautions to be maintained throughout the outpatient stay will include: orient to surroundings, keep bed in low position, maintain call bell within reach at all times, provide assistance with transfer out of bed and ambulation.

## 2022-10-27 ENCOUNTER — Inpatient Hospital Stay: Payer: Medicare HMO | Attending: Oncology

## 2022-10-27 VITALS — BP 123/65 | HR 75 | Temp 99.0°F | Resp 18

## 2022-10-27 DIAGNOSIS — D509 Iron deficiency anemia, unspecified: Secondary | ICD-10-CM | POA: Insufficient documentation

## 2022-10-27 DIAGNOSIS — D500 Iron deficiency anemia secondary to blood loss (chronic): Secondary | ICD-10-CM

## 2022-10-27 MED ORDER — SODIUM CHLORIDE 0.9 % IV SOLN
200.0000 mg | Freq: Once | INTRAVENOUS | Status: AC
  Administered 2022-10-27: 200 mg via INTRAVENOUS
  Filled 2022-10-27: qty 200

## 2022-10-27 MED ORDER — SODIUM CHLORIDE 0.9 % IV SOLN
Freq: Once | INTRAVENOUS | Status: AC
  Filled 2022-10-27: qty 250

## 2022-10-27 NOTE — Telephone Encounter (Signed)
Refaxed request again to Dr Marianna Payment office.

## 2022-11-01 DIAGNOSIS — R69 Illness, unspecified: Secondary | ICD-10-CM | POA: Diagnosis not present

## 2022-11-01 DIAGNOSIS — Z7901 Long term (current) use of anticoagulants: Secondary | ICD-10-CM | POA: Diagnosis not present

## 2022-11-01 DIAGNOSIS — T50905A Adverse effect of unspecified drugs, medicaments and biological substances, initial encounter: Secondary | ICD-10-CM | POA: Diagnosis not present

## 2022-11-01 DIAGNOSIS — I4891 Unspecified atrial fibrillation: Secondary | ICD-10-CM | POA: Diagnosis not present

## 2022-11-01 DIAGNOSIS — R040 Epistaxis: Secondary | ICD-10-CM | POA: Diagnosis not present

## 2022-11-01 DIAGNOSIS — D500 Iron deficiency anemia secondary to blood loss (chronic): Secondary | ICD-10-CM | POA: Diagnosis not present

## 2022-11-01 DIAGNOSIS — I5032 Chronic diastolic (congestive) heart failure: Secondary | ICD-10-CM | POA: Diagnosis not present

## 2022-11-01 DIAGNOSIS — K269 Duodenal ulcer, unspecified as acute or chronic, without hemorrhage or perforation: Secondary | ICD-10-CM | POA: Diagnosis not present

## 2022-11-01 DIAGNOSIS — I2721 Secondary pulmonary arterial hypertension: Secondary | ICD-10-CM | POA: Diagnosis not present

## 2022-11-01 LAB — TOXASSURE SELECT 13 (MW), URINE

## 2022-11-02 ENCOUNTER — Encounter: Payer: Medicare HMO | Admitting: Family

## 2022-11-02 DIAGNOSIS — M12811 Other specific arthropathies, not elsewhere classified, right shoulder: Secondary | ICD-10-CM | POA: Diagnosis not present

## 2022-11-02 DIAGNOSIS — M19011 Primary osteoarthritis, right shoulder: Secondary | ICD-10-CM | POA: Diagnosis not present

## 2022-11-03 ENCOUNTER — Inpatient Hospital Stay: Payer: Medicare HMO

## 2022-11-03 VITALS — BP 135/59 | HR 99 | Temp 98.2°F | Resp 20

## 2022-11-03 DIAGNOSIS — D500 Iron deficiency anemia secondary to blood loss (chronic): Secondary | ICD-10-CM

## 2022-11-03 DIAGNOSIS — D509 Iron deficiency anemia, unspecified: Secondary | ICD-10-CM | POA: Diagnosis not present

## 2022-11-03 MED ORDER — SODIUM CHLORIDE 0.9 % IV SOLN
Freq: Once | INTRAVENOUS | Status: AC
  Filled 2022-11-03: qty 250

## 2022-11-03 MED ORDER — SODIUM CHLORIDE 0.9 % IV SOLN
200.0000 mg | Freq: Once | INTRAVENOUS | Status: AC
  Administered 2022-11-03: 200 mg via INTRAVENOUS
  Filled 2022-11-03: qty 200

## 2022-11-03 NOTE — Patient Instructions (Signed)
[facility]  Discharge Instructions: Thank you for choosing [facility] to provide your oncology and hematology care.  If you have a lab appointment with the [facility], please go directly to the [facility] and check in at the registration area.  Wear comfortable clothing and clothing appropriate for easy access to any Portacath or PICC line.   We strive to give you quality time with your provider. You may need to reschedule your appointment if you arrive late (15 or more minutes).  Arriving late affects you and other patients whose appointments are after yours.  Also, if you miss three or more appointments without notifying the office, you may be dismissed from the clinic at the provider's discretion.      For prescription refill requests, have your pharmacy contact our office and allow 72 hours for refills to be completed.    Today you received the following chemotherapy and/or immunotherapy agents Venofer      To help prevent nausea and vomiting after your treatment, we encourage you to take your nausea medication as directed.  BELOW ARE SYMPTOMS THAT SHOULD BE REPORTED IMMEDIATELY: *FEVER GREATER THAN 100.4 F (38 C) OR HIGHER *CHILLS OR SWEATING *NAUSEA AND VOMITING THAT IS NOT CONTROLLED WITH YOUR NAUSEA MEDICATION *UNUSUAL SHORTNESS OF BREATH *UNUSUAL BRUISING OR BLEEDING *URINARY PROBLEMS (pain or burning when urinating, or frequent urination) *BOWEL PROBLEMS (unusual diarrhea, constipation, pain near the anus) TENDERNESS IN MOUTH AND THROAT WITH OR WITHOUT PRESENCE OF ULCERS (sore throat, sores in mouth, or a toothache) UNUSUAL RASH, SWELLING OR PAIN  UNUSUAL VAGINAL DISCHARGE OR ITCHING   Items with * indicate a potential emergency and should be followed up as soon as possible or go to the Emergency Department if any problems should occur.  Please show the CHEMOTHERAPY ALERT CARD or IMMUNOTHERAPY ALERT CARD at check-in to the  Emergency Department and triage nurse.  Should you have questions after your visit or need to cancel or reschedule your appointment, please contact [facility] [place] [facility] [place]  [phone] and follow the prompts.  Office hours are 8:00 a.m. to 4:30 p.m. Monday - Friday. Please note that voicemails left after 4:00 p.m. may not be returned until the following business day.  We are closed weekends and major holidays. You have access to a nurse at all times for urgent questions. Please call the main number to the clinic [phone] and follow the prompts.  For any non-urgent questions, you may also contact your provider using MyChart. We now offer e-Visits for anyone 67 and older to request care online for non-urgent symptoms. For details visit [email or web address].   Also download the MyChart app! Go to the app store, search "MyChart", open the app, select [facility], and log in with your MyChart username and password.

## 2022-11-05 ENCOUNTER — Other Ambulatory Visit: Payer: Self-pay

## 2022-11-05 ENCOUNTER — Encounter: Payer: Self-pay | Admitting: Emergency Medicine

## 2022-11-05 ENCOUNTER — Emergency Department: Payer: Medicare HMO

## 2022-11-05 ENCOUNTER — Observation Stay
Admission: EM | Admit: 2022-11-05 | Discharge: 2022-11-06 | Disposition: A | Discharge status: Home / Self Care | Payer: Medicare HMO | Attending: Internal Medicine | Admitting: Internal Medicine

## 2022-11-05 DIAGNOSIS — D62 Acute posthemorrhagic anemia: Secondary | ICD-10-CM | POA: Diagnosis not present

## 2022-11-05 DIAGNOSIS — Z7901 Long term (current) use of anticoagulants: Secondary | ICD-10-CM | POA: Insufficient documentation

## 2022-11-05 DIAGNOSIS — G4733 Obstructive sleep apnea (adult) (pediatric): Secondary | ICD-10-CM | POA: Diagnosis present

## 2022-11-05 DIAGNOSIS — K219 Gastro-esophageal reflux disease without esophagitis: Secondary | ICD-10-CM | POA: Diagnosis present

## 2022-11-05 DIAGNOSIS — R040 Epistaxis: Secondary | ICD-10-CM | POA: Diagnosis not present

## 2022-11-05 DIAGNOSIS — D649 Anemia, unspecified: Secondary | ICD-10-CM | POA: Diagnosis present

## 2022-11-05 DIAGNOSIS — I480 Paroxysmal atrial fibrillation: Secondary | ICD-10-CM | POA: Insufficient documentation

## 2022-11-05 DIAGNOSIS — Z79899 Other long term (current) drug therapy: Secondary | ICD-10-CM | POA: Insufficient documentation

## 2022-11-05 DIAGNOSIS — I4892 Unspecified atrial flutter: Secondary | ICD-10-CM | POA: Diagnosis not present

## 2022-11-05 DIAGNOSIS — R9431 Abnormal electrocardiogram [ECG] [EKG]: Secondary | ICD-10-CM | POA: Diagnosis not present

## 2022-11-05 DIAGNOSIS — Z87891 Personal history of nicotine dependence: Secondary | ICD-10-CM | POA: Diagnosis not present

## 2022-11-05 DIAGNOSIS — E669 Obesity, unspecified: Secondary | ICD-10-CM | POA: Diagnosis present

## 2022-11-05 DIAGNOSIS — J441 Chronic obstructive pulmonary disease with (acute) exacerbation: Secondary | ICD-10-CM | POA: Diagnosis not present

## 2022-11-05 DIAGNOSIS — I5032 Chronic diastolic (congestive) heart failure: Secondary | ICD-10-CM | POA: Diagnosis not present

## 2022-11-05 DIAGNOSIS — J9811 Atelectasis: Secondary | ICD-10-CM | POA: Diagnosis not present

## 2022-11-05 DIAGNOSIS — G4730 Sleep apnea, unspecified: Secondary | ICD-10-CM | POA: Diagnosis present

## 2022-11-05 DIAGNOSIS — R059 Cough, unspecified: Secondary | ICD-10-CM | POA: Diagnosis not present

## 2022-11-05 DIAGNOSIS — F418 Other specified anxiety disorders: Secondary | ICD-10-CM | POA: Diagnosis present

## 2022-11-05 DIAGNOSIS — E039 Hypothyroidism, unspecified: Secondary | ICD-10-CM | POA: Diagnosis not present

## 2022-11-05 DIAGNOSIS — J449 Chronic obstructive pulmonary disease, unspecified: Secondary | ICD-10-CM | POA: Diagnosis not present

## 2022-11-05 LAB — CBC WITH DIFFERENTIAL/PLATELET
Abs Immature Granulocytes: 0.09 10*3/uL — ABNORMAL HIGH (ref 0.00–0.07)
Basophils Absolute: 0 10*3/uL (ref 0.0–0.1)
Basophils Relative: 0 %
Eosinophils Absolute: 0.1 10*3/uL (ref 0.0–0.5)
Eosinophils Relative: 1 %
HCT: 28.4 % — ABNORMAL LOW (ref 36.0–46.0)
Hemoglobin: 7.5 g/dL — ABNORMAL LOW (ref 12.0–15.0)
Immature Granulocytes: 1 %
Lymphocytes Relative: 7 %
Lymphs Abs: 0.7 10*3/uL (ref 0.7–4.0)
MCH: 25.8 pg — ABNORMAL LOW (ref 26.0–34.0)
MCHC: 26.4 g/dL — ABNORMAL LOW (ref 30.0–36.0)
MCV: 97.6 fL (ref 80.0–100.0)
Monocytes Absolute: 0.5 10*3/uL (ref 0.1–1.0)
Monocytes Relative: 5 %
Neutro Abs: 8.9 10*3/uL — ABNORMAL HIGH (ref 1.7–7.7)
Neutrophils Relative %: 86 %
Platelets: 239 10*3/uL (ref 150–400)
RBC: 2.91 MIL/uL — ABNORMAL LOW (ref 3.87–5.11)
RDW: 24.7 % — ABNORMAL HIGH (ref 11.5–15.5)
Smear Review: NORMAL
WBC: 10.4 10*3/uL (ref 4.0–10.5)
nRBC: 0.5 % — ABNORMAL HIGH (ref 0.0–0.2)

## 2022-11-05 LAB — COMPREHENSIVE METABOLIC PANEL
ALT: 18 U/L (ref 0–44)
AST: 23 U/L (ref 15–41)
Albumin: 3.5 g/dL (ref 3.5–5.0)
Alkaline Phosphatase: 82 U/L (ref 38–126)
Anion gap: 8 (ref 5–15)
BUN: 11 mg/dL (ref 8–23)
CO2: 27 mmol/L (ref 22–32)
Calcium: 8.8 mg/dL — ABNORMAL LOW (ref 8.9–10.3)
Chloride: 103 mmol/L (ref 98–111)
Creatinine, Ser: 0.66 mg/dL (ref 0.44–1.00)
GFR, Estimated: >60 mL/min (ref >60)
Glucose, Bld: 104 mg/dL — ABNORMAL HIGH (ref 70–99)
Potassium: 3.7 mmol/L (ref 3.5–5.1)
Sodium: 138 mmol/L (ref 135–145)
Total Bilirubin: 0.4 mg/dL (ref 0.3–1.2)
Total Protein: 6.7 g/dL (ref 6.5–8.1)

## 2022-11-05 LAB — TROPONIN I (HIGH SENSITIVITY): Troponin I (High Sensitivity): 6 ng/L (ref <18)

## 2022-11-05 LAB — PREPARE RBC (CROSSMATCH)

## 2022-11-05 MED ORDER — ROFLUMILAST 500 MCG PO TABS
500.0000 ug | ORAL_TABLET | Freq: Every day | ORAL | Status: DC
  Administered 2022-11-06: 500 ug via ORAL
  Filled 2022-11-05: qty 1

## 2022-11-05 MED ORDER — SILVER NITRATE-POT NITRATE 75-25 % EX MISC
1.0000 | Freq: Once | CUTANEOUS | Status: DC
  Filled 2022-11-05: qty 10

## 2022-11-05 MED ORDER — TORSEMIDE 20 MG PO TABS
80.0000 mg | ORAL_TABLET | Freq: Every day | ORAL | Status: DC
  Administered 2022-11-06: 80 mg via ORAL
  Filled 2022-11-05: qty 4

## 2022-11-05 MED ORDER — SERTRALINE HCL 50 MG PO TABS
100.0000 mg | ORAL_TABLET | Freq: Every day | ORAL | Status: DC
  Administered 2022-11-06: 100 mg via ORAL
  Filled 2022-11-05: qty 2

## 2022-11-05 MED ORDER — OXYMETAZOLINE HCL 0.05 % NA SOLN
1.0000 | Freq: Once | NASAL | Status: AC
  Administered 2022-11-05: 1 via NASAL
  Filled 2022-11-05: qty 30

## 2022-11-05 MED ORDER — SIMVASTATIN 20 MG PO TABS
20.0000 mg | ORAL_TABLET | Freq: Every day | ORAL | Status: DC
  Administered 2022-11-05: 20 mg via ORAL
  Filled 2022-11-05: qty 1

## 2022-11-05 MED ORDER — POTASSIUM CHLORIDE CRYS ER 20 MEQ PO TBCR
20.0000 meq | EXTENDED_RELEASE_TABLET | Freq: Two times a day (BID) | ORAL | Status: DC
  Administered 2022-11-05 – 2022-11-06 (×2): 20 meq via ORAL
  Filled 2022-11-05 (×2): qty 1

## 2022-11-05 MED ORDER — IPRATROPIUM-ALBUTEROL 0.5-2.5 (3) MG/3ML IN SOLN
3.0000 mL | Freq: Once | RESPIRATORY_TRACT | Status: AC
  Administered 2022-11-05: 3 mL via RESPIRATORY_TRACT
  Filled 2022-11-05: qty 3

## 2022-11-05 MED ORDER — ONDANSETRON HCL 4 MG/2ML IJ SOLN: 4.0000 mg | Freq: Four times a day (QID) | INTRAMUSCULAR | Status: DC | PRN

## 2022-11-05 MED ORDER — BUTALBITAL-APAP-CAFFEINE 50-325-40 MG PO TABS
1.0000 | ORAL_TABLET | Freq: Four times a day (QID) | ORAL | Status: DC | PRN
  Administered 2022-11-06 (×2): 1 via ORAL
  Filled 2022-11-05 (×2): qty 1

## 2022-11-05 MED ORDER — BUSPIRONE HCL 10 MG PO TABS
5.0000 mg | ORAL_TABLET | Freq: Two times a day (BID) | ORAL | Status: DC
  Administered 2022-11-05 – 2022-11-06 (×2): 5 mg via ORAL
  Filled 2022-11-05 (×2): qty 1

## 2022-11-05 MED ORDER — AMIODARONE HCL 200 MG PO TABS
200.0000 mg | ORAL_TABLET | Freq: Every day | ORAL | Status: DC
  Administered 2022-11-05 – 2022-11-06 (×2): 200 mg via ORAL
  Filled 2022-11-05 (×2): qty 1

## 2022-11-05 MED ORDER — HYDROCODONE-ACETAMINOPHEN 5-325 MG PO TABS
1.0000 | ORAL_TABLET | Freq: Three times a day (TID) | ORAL | Status: DC | PRN
  Administered 2022-11-05: 1 via ORAL
  Filled 2022-11-05: qty 1

## 2022-11-05 MED ORDER — TORSEMIDE 20 MG PO TABS
40.0000 mg | ORAL_TABLET | Freq: Every day | ORAL | Status: DC
  Administered 2022-11-05: 40 mg via ORAL
  Filled 2022-11-05: qty 2

## 2022-11-05 MED ORDER — IPRATROPIUM-ALBUTEROL 0.5-2.5 (3) MG/3ML IN SOLN
3.0000 mL | Freq: Four times a day (QID) | RESPIRATORY_TRACT | Status: DC | PRN
  Administered 2022-11-05 – 2022-11-06 (×2): 3 mL via RESPIRATORY_TRACT
  Filled 2022-11-05 (×2): qty 3

## 2022-11-05 MED ORDER — PREDNISONE 20 MG PO TABS
40.0000 mg | ORAL_TABLET | Freq: Every day | ORAL | Status: DC
  Administered 2022-11-06: 40 mg via ORAL
  Filled 2022-11-05: qty 2

## 2022-11-05 MED ORDER — AMOXICILLIN-POT CLAVULANATE 875-125 MG PO TABS
1.0000 | ORAL_TABLET | Freq: Once | ORAL | Status: AC
  Administered 2022-11-05: 1 via ORAL
  Filled 2022-11-05: qty 1

## 2022-11-05 MED ORDER — LIDOCAINE-EPINEPHRINE (PF) 2 %-1:200000 IJ SOLN
20.0000 mL | Freq: Once | INTRAMUSCULAR | Status: AC
  Administered 2022-11-05: 20 mL
  Filled 2022-11-05: qty 20

## 2022-11-05 MED ORDER — PANTOPRAZOLE SODIUM 40 MG PO TBEC
40.0000 mg | DELAYED_RELEASE_TABLET | Freq: Two times a day (BID) | ORAL | Status: DC
  Administered 2022-11-05 – 2022-11-06 (×2): 40 mg via ORAL
  Filled 2022-11-05 (×2): qty 1

## 2022-11-05 MED ORDER — METHYLPREDNISOLONE SODIUM SUCC 125 MG IJ SOLR
125.0000 mg | Freq: Once | INTRAMUSCULAR | Status: AC
  Administered 2022-11-05: 125 mg via INTRAVENOUS
  Filled 2022-11-05: qty 2

## 2022-11-05 MED ORDER — VITAMIN D3 25 MCG (1000 UNIT) PO TABS
2000.0000 [IU] | ORAL_TABLET | Freq: Every day | ORAL | Status: DC
  Administered 2022-11-06: 2000 [IU] via ORAL
  Filled 2022-11-05 (×2): qty 2

## 2022-11-05 MED ORDER — SODIUM CHLORIDE 0.9 % IV SOLN: 10.0000 mL/h | Freq: Once | INTRAVENOUS | Status: DC

## 2022-11-05 MED ORDER — METOLAZONE 2.5 MG PO TABS: 2.5000 mg | ORAL_TABLET | ORAL | Status: DC

## 2022-11-05 MED ORDER — FLUTICASONE FUROATE-VILANTEROL 100-25 MCG/ACT IN AEPB
1.0000 | INHALATION_SPRAY | Freq: Every day | RESPIRATORY_TRACT | Status: DC
  Filled 2022-11-05: qty 28

## 2022-11-05 MED ORDER — SPIRONOLACTONE 25 MG PO TABS
25.0000 mg | ORAL_TABLET | Freq: Every day | ORAL | Status: DC
  Administered 2022-11-05 – 2022-11-06 (×2): 25 mg via ORAL
  Filled 2022-11-05 (×2): qty 1

## 2022-11-05 MED ORDER — DILTIAZEM HCL ER COATED BEADS 180 MG PO CP24
180.0000 mg | ORAL_CAPSULE | Freq: Every day | ORAL | Status: DC
  Administered 2022-11-05 – 2022-11-06 (×2): 180 mg via ORAL
  Filled 2022-11-05 (×3): qty 1

## 2022-11-05 MED ORDER — ACETAMINOPHEN 325 MG PO TABS
650.0000 mg | ORAL_TABLET | Freq: Once | ORAL | Status: AC
  Administered 2022-11-05: 650 mg via ORAL
  Filled 2022-11-05: qty 2

## 2022-11-05 MED ORDER — VARENICLINE TARTRATE 0.5 MG PO TABS
1.0000 mg | ORAL_TABLET | Freq: Two times a day (BID) | ORAL | Status: DC
  Administered 2022-11-05 – 2022-11-06 (×2): 1 mg via ORAL
  Filled 2022-11-05 (×2): qty 2

## 2022-11-05 MED ORDER — ACETAMINOPHEN 500 MG PO TABS: 1000.0000 mg | ORAL_TABLET | Freq: Three times a day (TID) | ORAL | Status: DC | PRN

## 2022-11-05 MED ORDER — UMECLIDINIUM BROMIDE 62.5 MCG/ACT IN AEPB
1.0000 | INHALATION_SPRAY | Freq: Every day | RESPIRATORY_TRACT | Status: DC
  Filled 2022-11-05: qty 7

## 2022-11-05 MED ORDER — ONDANSETRON HCL 4 MG PO TABS: 4.0000 mg | ORAL_TABLET | Freq: Four times a day (QID) | ORAL | Status: DC | PRN

## 2022-11-05 MED ORDER — MELATONIN 5 MG PO TABS
5.0000 mg | ORAL_TABLET | Freq: Every day | ORAL | Status: DC
  Administered 2022-11-05: 5 mg via ORAL
  Filled 2022-11-05: qty 1

## 2022-11-05 MED ORDER — FLUTICASONE-UMECLIDIN-VILANT 100-62.5-25 MCG/INH IN AEPB: 1.0000 | INHALATION_SPRAY | Freq: Every day | RESPIRATORY_TRACT | Status: DC

## 2022-11-05 MED ORDER — FERROUS SULFATE 325 (65 FE) MG PO TABS
325.0000 mg | ORAL_TABLET | Freq: Every day | ORAL | Status: DC
  Administered 2022-11-06: 325 mg via ORAL
  Filled 2022-11-05: qty 1

## 2022-11-05 NOTE — ED Notes (Signed)
Patient coughed up a blood clot. Patient states blood is going down the back of her throat.

## 2022-11-05 NOTE — H&P (Signed)
History and Physical    Patient: Theresa [name] ZOX:[id number] DOB: 1955-11-21 DOA: 11/05/2022 DOS: the patient was seen and examined on 11/05/2022 PCP: Birdie Sons, MD  Patient coming from: Home  Chief Complaint:  Chief Complaint  Patient presents with   Epistaxis   HPI: Theresa [name] is a 67 y.o. female with medical history significant A-fib on chronic anticoagulation therapy with Xarelto, chronic diastolic dysfunction CHF, pulmonary hypertension, COPD with chronic respiratory failure on 4 L of oxygen, GERD, hypothyroidism, depression, nicotine dependence, obesity (BMI 37), obstructive sleep apnea, chronic pain syndrome who presents to the ER via private vehicle for refractory epistaxis. Patient states that she has had nosebleeds every day for the last 2 weeks, sometimes multiple episodes daily which have been self-limiting until this morning when she woke up and developed another episode of nosebleeds which was persistent despite several attempts to control the bleeding with pressure. She complains of feeling cold and weak but denies feeling dizzy or lightheaded.  She also complains of worsening shortness of breath from her baseline.  She denies having a cough but notes that with her nosebleeds she usually coughs large clots which she thinks are related to postnasal drainage.  She had an episode of fever during the week. She denies having any chest pain, no abdominal pain, no headache, no blurred vision, no hematemesis, no hematochezia, no leg swelling, no blurred vision no focal deficit. Abnormal labs hemoglobin 7.5 compared to baseline of 9.0 from a month ago ER physician was able to control bleeding by inserting Merocel sponge into the right nare Patient noted to have diffuse wheezing concerning for acute COPD exacerbation 1 unit of packed RBC has been ordered and she received a dose of Solu-Medrol 125 mg She will be referred to inpatient status for further evaluation   Review of  Systems: As mentioned in the history of present illness. All other systems reviewed and are negative. Past Medical History:  Diagnosis Date   Acute postoperative pain 10/03/2017   Anxiety    BiPAP (biphasic positive airway pressure) dependence    at hs   Carpal tunnel syndrome    CHF (congestive heart failure) ([place])    1/18   CHF (congestive heart failure) (HCC)    Chronic generalized abdominal pain    Chronic rhinitis    COPD (chronic obstructive pulmonary disease) (HCC)    DDD (degenerative disc disease), cervical    DDD (degenerative disc disease), lumbosacral    Depression    Dyspnea    Edema    Flu    1/18   Gastritis    GERD (gastroesophageal reflux disease)    Hematuria    Hernia of abdominal wall 07/17/2018   Hernia, abdominal    Hypertension    Kidney stones    Low back pain    Lumbar radiculitis    Malodorous urine    Muscle weakness    Obesity    Oxygen dependent    Pneumonia due to COVID-19 virus 02/03/2020   Renal cyst    Sensory urge incontinence    Thyroid activity decreased    9/19   Tobacco abuse    Wheezing    Past Surgical History:  Procedure Laterality Date   ABDOMINAL HYSTERECTOMY     CARDIOVERSION N/A 04/29/2022   Procedure: CARDIOVERSION;  Surgeon: Corey Skains, MD;  Location: [facility];  Service: Cardiovascular;  Laterality: N/A;   CARPAL TUNNEL RELEASE Left 2012   cataract surgery Bilateral    EPIGASTRIC HERNIA REPAIR  N/A 08/13/2018   HERNIA REPAIR EPIGASTRIC ADULT; polypropylene mesh reinforcement.  Surgeon: Robert Bellow, MD;  Location: [facility];  Service: General;  Laterality: N/A;   ESOPHAGOGASTRODUODENOSCOPY N/A 06/21/2022   Procedure: ESOPHAGOGASTRODUODENOSCOPY (EGD);  Surgeon: Toledo, Benay Pike, MD;  Location: [facility];  Service: Gastroenterology;  Laterality: N/A;   RIGHT/LEFT HEART CATH AND CORONARY ANGIOGRAPHY N/A 07/11/2019   Procedure: RIGHT/LEFT HEART CATH AND CORONARY ANGIOGRAPHY;  Surgeon: Yolonda Kida,  MD;  Location: [facility];  Service: Cardiovascular;  Laterality: N/A;   TEE WITHOUT CARDIOVERSION N/A 04/29/2022   Procedure: TRANSESOPHAGEAL ECHOCARDIOGRAM (TEE);  Surgeon: Corey Skains, MD;  Location: [facility];  Service: Cardiovascular;  Laterality: N/A;   TONSILLECTOMY     TUBAL LIGATION     UMBILICAL HERNIA REPAIR N/A 08/13/2018   HERNIA REPAIR UMBILICAL ADULT; polypropylene mesh reinforcement Surgeon: Robert Bellow, MD;  Location: [facility];  Service: General;  Laterality: N/A;   Social History:  reports that she quit smoking about 5 months ago. Her smoking use included cigarettes. She has a 66.00 pack-year smoking history. She has never used smokeless tobacco. She reports current drug use. She reports that she does not drink alcohol.  Allergies  Allergen Reactions   Gabapentin Other (See Comments)    Higher doses of 800 mg, 600 mg, 300 mg causes excessive sedation and contributes to numerous mechanical falls. (Unsure if 100 mg dose can be tolerated)   Codeine Nausea And Vomiting   Meloxicam Other (See Comments)    Stomach pain    Family History  Problem Relation Age of Onset   Heart disease Mother    Stroke Mother    Coronary artery disease Mother    Alcohol abuse Father    Heart disease Father    Lung cancer Sister    Cancer Sister    Breast cancer Sister    Cancer Brother    Cancer Brother    Kidney cancer Brother    Pneumonia Brother    Prostate cancer Neg Hx    Bladder Cancer Neg Hx     Prior to Admission medications   Medication Sig Start Date End Date Taking? Authorizing Provider  acetaminophen (TYLENOL) 500 MG tablet Take 1,000 mg by mouth every 8 (eight) hours as needed.    [provider]  albuterol (VENTOLIN HFA) 108 (90 Base) MCG/ACT inhaler Inhale 2 puffs into the lungs every 6 (six) hours as needed for wheezing or shortness of breath. 10/23/19   Rudene Re, MD  amiodarone (PACERONE) 200 MG tablet Take 1 tablet (200 mg  total) by mouth daily. 04/30/22   Sharen Hones, MD  busPIRone (BUSPAR) 5 MG tablet TAKE 1 TABLET BY MOUTH TWICE DAILY 09/03/22   Birdie Sons, MD  butalbital-acetaminophen-caffeine (FIORICET) [phone] MG tablet Take 1 tablet by mouth every 6 (six) hours as needed for headache. LIMIT USE TO PREVENT MEDICATION OVERUSE HEADACHE 04/09/22   Emeterio Reeve, DO  Cholecalciferol 50 MCG (2000 UT) CAPS Take 2,000 Units by mouth daily.     [provider]  dapagliflozin propanediol (FARXIGA) 10 MG TABS tablet Take 1 tablet (10 mg total) by mouth daily before breakfast. 08/31/22   Darylene Price A, FNP  diclofenac Sodium (VOLTAREN) 1 % GEL Apply topically. Apply to affected area up to 4 times per day (shoulder and lowe back) 07/10/22 07/10/23  [provider]  diltiazem (CARDIZEM CD) 180 MG 24 hr capsule Take 180 mg by mouth daily. 05/20/22   [provider]  ferrous sulfate 325 (65 FE) MG EC tablet Take 325 mg by mouth daily.    [provider]  Fluticasone-Umeclidin-Vilant 100-62.5-25 MCG/INH AEPB Inhale 1 puff into the lungs daily. 10/03/18   [provider]  HYDROcodone-acetaminophen (NORCO/VICODIN) 5-325 MG tablet Take 1 tablet by mouth every 8 (eight) hours as needed for severe pain. Must last 30 days 10/31/22 11/30/22  Milinda Pointer, MD  HYDROcodone-acetaminophen (NORCO/VICODIN) 5-325 MG tablet Take 1 tablet by mouth every 8 (eight) hours as needed for severe pain. Must last 30 days 11/30/22 12/30/22  Milinda Pointer, MD  HYDROcodone-acetaminophen (NORCO/VICODIN) 5-325 MG tablet Take 1 tablet by mouth every 8 (eight) hours as needed for severe pain. Must last 30 days 12/30/22 01/29/23  Milinda Pointer, MD  ipratropium-albuterol (DUONEB) 0.5-2.5 (3) MG/3ML SOLN USE 1 VIAL VIA NEBULIZER EVERY 6 HOURS AS NEEDED FOR SHORTNESS OF BREATH OR WHEEZING 11/30/20   Chrismon, Vickki Muff, PA-C  metolazone (ZAROXOLYN) 2.5 MG tablet Take 1 tablet (2.5 mg total) by mouth  every other day. Take 1/2 hour before torsemide 09/06/22   [name], FNP  naloxone [facility]) nasal spray 4 mg/0.1 mL Place 1 spray into the nose as needed for up to 365 doses (for opioid-induced respiratory depresssion). In case of emergency (overdose), spray once into each nostril. If no response within 3 minutes, repeat application and call [phone]. 08/03/22 08/03/23  Milinda Pointer, MD  OXYGEN Inhale 4 L/min into the lungs continuous. 4 L at rest 8 L with exertion  10 L shower    [provider]  pantoprazole (PROTONIX) 40 MG tablet Take 1 tablet (40 mg total) by mouth daily. Patient taking differently: Take 40 mg by mouth 2 (two) times daily. 06/23/22   Lorella Nimrod, MD  potassium chloride SA (KLOR-CON M) 20 MEQ tablet Take 2 tablets every morning and 1 tablet every evening 06/22/22   [name], FNP  rivaroxaban (XARELTO) 20 MG TABS tablet Take 1 tablet (20 mg total) by mouth daily with supper. 04/29/22   Sharen Hones, MD  roflumilast (DALIRESP) 500 MCG TABS tablet Take 500 mcg by mouth daily.    [provider]  sertraline (ZOLOFT) 100 MG tablet TAKE 2 TABLETS BY MOUTH ONCE EVERY MORNING 10/03/22   Birdie Sons, MD  simvastatin (ZOCOR) 20 MG tablet Take 20 mg by mouth at bedtime. 04/11/22   [provider]  spironolactone (ALDACTONE) 25 MG tablet Take 1 tablet by mouth daily. 09/16/22   [provider]  torsemide (DEMADEX) 20 MG tablet Take 20 mg by mouth 2 (two) times daily. 80 mg (4 tablets) in the morning, and 40 mg (2 tablets) in the evening    [provider]  varenicline (CHANTIX) 1 MG tablet Take by mouth. 07/11/22   [provider]    Physical Exam: Vitals:   11/05/22 1312 11/05/22 1314  BP: 133/62   Pulse: 86   Resp: 20   Temp: 98.2 F (36.8 C)   TempSrc: Oral   SpO2: 100%   Weight:  93 kg  Height:  _0  (1.575 m)   Physical Exam Vitals and nursing note reviewed.  Constitutional:      Appearance: Normal  appearance. She is obese.  HENT:     Nose:     Comments: Packing in right nare    Mouth/Throat:     Mouth: Mucous membranes are moist.  Eyes:     Comments: Pale conjunctiva  Cardiovascular:     Rate and  Rhythm: Normal rate and regular rhythm.  Pulmonary:     Effort: Pulmonary effort is normal.     Breath sounds: Normal breath sounds.  Abdominal:     General: Bowel sounds are normal.     Palpations: Abdomen is soft.     Comments: Central adiposity  Musculoskeletal:        General: Normal range of motion.     Cervical back: Normal range of motion and neck supple.  Skin:    General: Skin is warm and dry.  Neurological:     Mental Status: She is alert and oriented to person, place, and time.  Psychiatric:        Mood and Affect: Mood normal.        Behavior: Behavior normal.     Data Reviewed: Relevant notes from primary care and specialist visits, past discharge summaries as available in EHR, including Care Everywhere. Prior diagnostic testing as pertinent to current admission diagnoses Updated medications and problem lists for reconciliation ED course, including vitals, labs, imaging, treatment and response to treatment Triage notes, nursing and pharmacy notes and ED provider's notes Notable results as noted in HPI Labs reviewed.  White count 10.4, hemoglobin 7.5 compared to baseline of 9.0, hematocrit 28.4, blood count 239, sodium 138, potassium 3.7, chloride 103, bicarb 27, glucose 104, BUN 11, creatinine 0.66, calcium 8.8, total protein 6.7, albumin 3.5, AST 23, ALT 18, alkaline phosphatase 82, total bilirubin 0.4 Chest x-ray reviewed by me shows basilar atelectasis Twelve-lead EKG reviewed by me shows normal sinus rhythm, incomplete right bundle branch block, low voltage QRS, left axis deviation There are no new results to review at this time.  Assessment and Plan: ABLA (acute blood loss anemia) Secondary to recurrent epistaxis in a patient with a history of atrial  flutter on chronic anticoagulation therapy as primary prophylaxis for an acute stroke Baseline hemoglobin is 9 and on admission patient is noted to have a hemoglobin of 7.5 Hold Xarelto 1 unit of packed RBC was ordered in the ER and nasal packing in place to control bleeding Repeat CBC in a.m.  Epistaxis, recurrent Treatment as outlined in 1  Paroxysmal atrial flutter (HCC) Continue amiodarone and Cardizem for rate control Hold Xarelto due to severe epistaxis resulting in acute blood loss anemia requiring blood transfusion Consult cardiology for further recommendations regarding continued anticoagulation therapy  COPD with acute exacerbation ([place]) Patient noted to have diffuse wheezing on exam as well as a cough She has an underlying history of COPD with chronic respiratory failure and is on 4 L of oxygen continuous to maintain pulse oximetry greater than 92% Continue as needed and scheduled bronchodilator therapy Continue inhaled steroids Place patient on prednisone 64m daily  Chronic diastolic heart failure (HCC) Stable and not acutely exacerbated Last known LVEF of 60-65 from a 2D echocardiogram which was done 07/23 Continue torsemide, metolazone, spironolactone Maintain low-sodium diet  Depression with anxiety Continue sertraline and buspirone  Sleep apnea Continue CPAP at bedtime  Obesity (BMI 30-39.9) BMI 37 Complicates overall prognosis and care Lifestyle modification and exercise has been discussed with patient in detail  GERD (gastroesophageal reflux disease) Continue PPI      Advance Care Planning:   Code Status: Full Code   Consults: Cardiology  Family Communication: Greater than 50% of time was spent discussing patient's condition and plan of care with her at the bedside.  All questions and concerns have been addressed.  She verbalizes understanding and agrees to the plan.  Severity of  Illness: The appropriate patient status for this patient is  OBSERVATION. Observation status is judged to be reasonable and necessary in order to provide the required intensity of service to ensure the patient's safety. The patient's presenting symptoms, physical exam findings, and initial radiographic and laboratory data in the context of their medical condition is felt to place them at decreased risk for further clinical deterioration. Furthermore, it is anticipated that the patient will be medically stable for discharge from the hospital within 2 midnights of admission.    Author: Collier Bullock, MD 11/05/2022 4:06 PM  For on call review [email or web address].

## 2022-11-05 NOTE — Assessment & Plan Note (Signed)
Continue sertraline and buspirone

## 2022-11-05 NOTE — Assessment & Plan Note (Signed)
Stable and not acutely exacerbated Last known LVEF of 60-65 from a 2D echocardiogram which was done 07/23 Continue torsemide, metolazone, spironolactone Maintain low-sodium diet

## 2022-11-05 NOTE — ED Notes (Signed)
No bleeding noted from right nare where packing is in place. Patient is eating food that her significant other brought in. Patient requested to changed over to a nasal cannula while she eats. Purewick is in place.

## 2022-11-05 NOTE — ED Triage Notes (Signed)
Pt to ER with c/o nose bleed that started at 1030 this morning.  Pt states has been having frequent nose bleeds for last week or two, sometimes 2-3 a day.  Pt states she takes Xarelto.  Pt controlling bleeding with pressure and a towel at this time.

## 2022-11-05 NOTE — ED Notes (Signed)
Patient has a aerosol mask on placed by Dr. Quentin Cornwall for patient's comfort at 4-5L O2.

## 2022-11-05 NOTE — Assessment & Plan Note (Signed)
Continue amiodarone and Cardizem for rate control Hold Xarelto due to severe epistaxis resulting in acute blood loss anemia requiring blood transfusion Consult cardiology for further recommendations regarding continued anticoagulation therapy

## 2022-11-05 NOTE — Assessment & Plan Note (Signed)
Treatment as outlined in 1

## 2022-11-05 NOTE — Assessment & Plan Note (Signed)
BMI 37 Complicates overall prognosis and care Lifestyle modification and exercise has been discussed with patient in detail

## 2022-11-05 NOTE — Plan of Care (Signed)
  Problem: Health Behavior/Discharge Planning: Goal: Ability to manage health-related needs will improve Outcome: Progressing

## 2022-11-05 NOTE — Assessment & Plan Note (Signed)
Continue CPAP at bedtime

## 2022-11-05 NOTE — ED Notes (Signed)
AFrin and Xylocaine given to Dr. Quentin Cornwall who is going to get the ENT cart.

## 2022-11-05 NOTE — ED Provider Notes (Signed)
[facility] Provider Note    Event Date/Time   First MD Initiated Contact with Patient 11/05/22 1320     (approximate)   History   Epistaxis   HPI  Theresa [name] is a 67 y.o. female on Xarelto for history of A-fib as well as chronic oxygen for COPD presents to the ER for evaluation of epistaxis cough congestion.  States that she frequently will have on and off nosebleeds but has been having 1 that started out of the right nare around 1030 this morning has not been able to get it to stop.  No fevers.  Denies any trauma.     Physical Exam   Triage Vital Signs: ED Triage Vitals  Enc Vitals Group     BP 11/05/22 1312 133/62     Pulse Rate 11/05/22 1312 86     Resp 11/05/22 1312 20     Temp 11/05/22 1312 98.2 F (36.8 C)     Temp Source 11/05/22 1312 Oral     SpO2 11/05/22 1312 100 %     Weight 11/05/22 1314 205 lb (93 kg)     Height 11/05/22 1314 _0  (1.575 m)     Head Circumference --      Peak Flow --      Pain Score 11/05/22 1313 0     Pain Loc --      Pain Edu? --      Excl. in [place]? --     Most recent vital signs: Vitals:   11/05/22 1312  BP: 133/62  Pulse: 86  Resp: 20  Temp: 98.2 F (36.8 C)  SpO2: 100%     Constitutional: Alert  Eyes: Conjunctivae are normal.  Head: Atraumatic. Nose: Epistaxis, from the right nare.  No identifiable polyp or ulceration.  No sign of posterior hemorrhage. Mouth/Throat: Mucous membranes are moist.   Neck: Painless ROM.  Cardiovascular:   Good peripheral circulation. Respiratory: Normal respiratory effort.  No retractions.  Gastrointestinal: Soft and nontender.  Musculoskeletal:  no deformity Neurologic:  MAE spontaneously. No gross focal neurologic deficits are appreciated.  Skin:  Skin is warm, dry and intact. No rash noted. Psychiatric: Mood and affect are normal. Speech and behavior are normal.    ED Results / Procedures / Treatments   Labs (all labs ordered are listed, but only  abnormal results are displayed) Labs Reviewed  CBC WITH DIFFERENTIAL/PLATELET - Abnormal; Notable for the following components:      Result Value   RBC 2.91 (*)    Hemoglobin 7.5 (*)    HCT 28.4 (*)    MCH 25.8 (*)    MCHC 26.4 (*)    RDW 24.7 (*)    nRBC 0.5 (*)    Neutro Abs 8.9 (*)    Abs Immature Granulocytes 0.09 (*)    All other components within normal limits  COMPREHENSIVE METABOLIC PANEL - Abnormal; Notable for the following components:   Glucose, Bld 104 (*)    Calcium 8.8 (*)    All other components within normal limits  PREPARE RBC (CROSSMATCH)  TROPONIN I (HIGH SENSITIVITY)     EKG  ED ECG REPORT I, Merlyn Lot, the attending physician, personally viewed and interpreted this ECG.   Date: 11/05/2022  EKG Time: 15:17  Rate: 80  Rhythm: sinus  Axis: normal  Intervals: normal  ST&T Change: no stemi, no depressions    RADIOLOGY Please see ED Course for my review and interpretation.  I personally reviewed all  radiographic images ordered to evaluate for the above acute complaints and reviewed radiology reports and findings.  These findings were personally discussed with the patient.  Please see medical record for radiology report.    PROCEDURES:  Critical Care performed: yes  .Critical Care  Performed by: Merlyn Lot, MD Authorized by: Merlyn Lot, MD   Critical care provider statement:    Critical care time (minutes):  35   Critical care was necessary to treat or prevent imminent or life-threatening deterioration of the following conditions:  Respiratory failure   Critical care was time spent personally by me on the following activities:  Ordering and performing treatments and interventions, ordering and review of laboratory studies, ordering and review of radiographic studies, pulse oximetry, re-evaluation of patient's condition, review of old charts, obtaining history from patient or surrogate, examination of patient, evaluation of  patient's response to treatment, discussions with primary provider, discussions with consultants and development of treatment plan with patient or surrogate .Epistaxis Management  Date/Time: 11/05/2022 3:14 PM  Performed by: Merlyn Lot, MD Authorized by: Merlyn Lot, MD   Anesthesia:    Anesthesia method:  Topical application   Topical anesthetic:  Lidocaine gel Procedure details:    Treatment site:  R anterior   Treatment method:  Merocel sponge   Treatment episode: initial   Post-procedure details:    Assessment:  Bleeding stopped   Procedure completion:  Tolerated    MEDICATIONS ORDERED IN ED: Medications  silver nitrate applicators applicator 1 Stick (1 Stick Topical Not Given 11/05/22 1430)  0.9 %  sodium chloride infusion (has no administration in time range)  amoxicillin-clavulanate (AUGMENTIN) 875-125 MG per tablet 1 tablet (has no administration in time range)  acetaminophen (TYLENOL) tablet 650 mg (has no administration in time range)  lidocaine-EPINEPHrine (XYLOCAINE W/EPI) 2 %-1:200000 (PF) injection 20 mL (20 mLs Other Given 11/05/22 1405)  oxymetazoline (AFRIN) 0.05 % nasal spray 1 spray (1 spray Each Nare Given 11/05/22 1406)  ipratropium-albuterol (DUONEB) 0.5-2.5 (3) MG/3ML nebulizer solution 3 mL (3 mLs Nebulization Given 11/05/22 1405)  ipratropium-albuterol (DUONEB) 0.5-2.5 (3) MG/3ML nebulizer solution 3 mL (3 mLs Nebulization Given 11/05/22 1405)  methylPREDNISolone sodium succinate (SOLU-MEDROL) 125 mg/2 mL injection 125 mg (125 mg Intravenous Given 11/05/22 1453)     IMPRESSION / MDM / ASSESSMENT AND PLAN / ED COURSE  I reviewed the triage vital signs and the nursing notes.                              Differential diagnosis includes, but is not limited to, Asthma, copd, CHF, pna, ptx, malignancy, Pe, anemia  Patient presenting to the ER for evaluation of symptoms as described above.  Based on symptoms, risk factors and considered above  differential, this presenting complaint could reflect a potentially life-threatening illness therefore the patient will be placed on continuous pulse oximetry and telemetry for monitoring.  Laboratory evaluation will be sent to evaluate for the above complaints.  Quite a bit of wheezing on exam concern for COPD exacerbation likely secondary to possible aspirated blood products.  She is protecting her airway.  Given nebulizer.      Clinical Course as of 11/05/22 1522  Sat Nov 05, 2022  1407 Chest x-ray on my review and interpretation without evidence of consolidation. [PR]  1436 Merocel sponge inserted to the right naris unable to identify source of bleeding.  No sign of persistent bleeding at this time.  Will continue to  observe. [PR]  1833 Patient with evidence of anemia drop from her baseline.  Do suspect she is experiencing some symptomatic anemia given nosebleeding.  Is currently hemostatic with Merocel sponge.  Wheezing is responding to nebulizer treatment.  I have ordered IV Solu-Medrol.  I do believe she is going to require hospitalization for COPD exacerbation as well as symptomatic anemia requiring blood transfusion.  Will consult hospitalist. [PR]    Clinical Course User Index [PR] Merlyn Lot, MD    FINAL CLINICAL IMPRESSION(S) / ED DIAGNOSES   Final diagnoses:  Epistaxis  Symptomatic anemia  Chronic obstructive pulmonary disease with acute exacerbation ([place])     Rx / DC Orders   ED Discharge Orders     None        Note:  This document was prepared using Dragon voice recognition software and may include unintentional dictation errors.    Merlyn Lot, MD 11/05/22 [phone]

## 2022-11-05 NOTE — Assessment & Plan Note (Addendum)
Patient noted to have diffuse wheezing on exam as well as a cough She has an underlying history of COPD with chronic respiratory failure and is on 4 L of oxygen continuous to maintain pulse oximetry greater than 92% Continue as needed and scheduled bronchodilator therapy Continue inhaled steroids Place patient on prednisone 63m daily

## 2022-11-05 NOTE — Assessment & Plan Note (Signed)
Continue PPI

## 2022-11-05 NOTE — Assessment & Plan Note (Addendum)
Secondary to recurrent epistaxis in a patient with a history of atrial flutter on chronic anticoagulation therapy as primary prophylaxis for an acute stroke Baseline hemoglobin is 9 and on admission patient is noted to have a hemoglobin of 7.5 Hold Xarelto 1 unit of packed RBC was ordered in the ER and nasal packing in place to control bleeding Repeat CBC in a.m.

## 2022-11-06 DIAGNOSIS — J441 Chronic obstructive pulmonary disease with (acute) exacerbation: Secondary | ICD-10-CM | POA: Diagnosis not present

## 2022-11-06 DIAGNOSIS — R040 Epistaxis: Secondary | ICD-10-CM | POA: Diagnosis not present

## 2022-11-06 DIAGNOSIS — D649 Anemia, unspecified: Secondary | ICD-10-CM

## 2022-11-06 DIAGNOSIS — D62 Acute posthemorrhagic anemia: Secondary | ICD-10-CM | POA: Diagnosis not present

## 2022-11-06 LAB — CBC
HCT: 28.5 % — ABNORMAL LOW (ref 36.0–46.0)
Hemoglobin: 7.9 g/dL — ABNORMAL LOW (ref 12.0–15.0)
MCH: 26.7 pg (ref 26.0–34.0)
MCHC: 27.7 g/dL — ABNORMAL LOW (ref 30.0–36.0)
MCV: 96.3 fL (ref 80.0–100.0)
Platelets: 249 10*3/uL (ref 150–400)
RBC: 2.96 MIL/uL — ABNORMAL LOW (ref 3.87–5.11)
RDW: 23.3 % — ABNORMAL HIGH (ref 11.5–15.5)
WBC: 8.4 10*3/uL (ref 4.0–10.5)
nRBC: 0.6 % — ABNORMAL HIGH (ref 0.0–0.2)

## 2022-11-06 LAB — BASIC METABOLIC PANEL
Anion gap: 7 (ref 5–15)
BUN: 14 mg/dL (ref 8–23)
CO2: 27 mmol/L (ref 22–32)
Calcium: 8.1 mg/dL — ABNORMAL LOW (ref 8.9–10.3)
Chloride: 103 mmol/L (ref 98–111)
Creatinine, Ser: 0.71 mg/dL (ref 0.44–1.00)
GFR, Estimated: >60 mL/min (ref >60)
Glucose, Bld: 141 mg/dL — ABNORMAL HIGH (ref 70–99)
Potassium: 3.7 mmol/L (ref 3.5–5.1)
Sodium: 137 mmol/L (ref 135–145)

## 2022-11-06 LAB — HIV ANTIBODY (ROUTINE TESTING W REFLEX): HIV Screen 4th Generation wRfx: NONREACTIVE

## 2022-11-06 MED ORDER — CEPHALEXIN 500 MG PO CAPS: 500.0000 mg | ORAL_CAPSULE | Freq: Four times a day (QID) | ORAL | 0 refills | Status: DC

## 2022-11-06 NOTE — Plan of Care (Signed)
IV's removed, discharge instructions reviewed and patient discharged to home with husband. Patients husband brought 02 from home.

## 2022-11-06 NOTE — Progress Notes (Deleted)
PROVIDER NOTE: Interpretation of information contained herein should be left to medically-trained personnel. Specific patient instructions are provided elsewhere under "Patient Instructions" section of medical record. This document was created in part using STT-dictation technology, any transcriptional errors that may result from this process are unintentional.  Patient: Theresa [name] Type: Established DOB: 19-Jun-1956 MRN: [id number] PCP: Birdie Sons, MD  Service: Procedure DOS: 11/10/2022 Setting: Ambulatory Location: Ambulatory outpatient facility Delivery: Face-to-face Provider: Gaspar Cola, MD Specialty: Interventional Pain Management Specialty designation: 09 Location: Outpatient facility Ref. Prov.: Milinda Pointer, MD    Procedure:           Type: Lumbar Facet, Medial Branch Radiofrequency Ablation (RFA) #2  Laterality: Right (-RT)  Level: L2, L3, L4, L5, and S1 Medial Branch Level(s). These levels will denervate the L3-4, L4-5, and L5-S1 lumbar facet joints.  Imaging: Fluoroscopy-guided         Anesthesia: Local anesthesia (1-2% Lidocaine) Anxiolysis: IV Versed         Sedation:                         DOS: 11/10/2022  Performed by: Gaspar Cola, MD  Purpose: Therapeutic/Palliative Indications: Low back pain severe enough to impact quality of life or function. Indications: No diagnosis found. Theresa [name] has been dealing with the above chronic pain for longer than three months and has either failed to respond, was unable to tolerate, or simply did not get enough benefit from other more conservative therapies including, but not limited to: 1. Over-the-counter medications 2. Anti-inflammatory medications 3. Muscle relaxants 4. Membrane stabilizers 5. Opioids 6. Physical therapy and/or chiropractic manipulation 7. Modalities (Heat, ice, etc.) 8. Invasive techniques such as nerve blocks. Theresa [name] has attained more than 50% relief of the pain from a  series of diagnostic injections conducted in separate occasions.  Pain Score: Pre-procedure:  /10 Post-procedure:  /10     Position / Prep / Materials:  Position: Prone  Prep solution: DuraPrep (Iodine Povacrylex [0.7% available iodine] and Isopropyl Alcohol, 74% w/w) Prep Area: Entire Lumbosacral Region (Lower back from mid-thoracic region to end of tailbone and from flank to flank.) Materials:  Tray: RFA (Radiofrequency) tray Needle(s):  Type: RFA (Teflon-coated radiofrequency ablation needles) Gauge (G):    ***     Length:    ***    Qty: 5  Pre-op H&P Assessment:  Theresa [name] is a 67 y.o. (year old), female patient, seen today for interventional treatment. She  has a past surgical history that includes Abdominal hysterectomy; Tonsillectomy; Carpal tunnel release (Left, 2012); Tubal ligation; epigastric hernia repair (N/A, 08/13/2018); Umbilical hernia repair (N/A, 08/13/2018); RIGHT/LEFT HEART CATH AND CORONARY ANGIOGRAPHY (N/A, 07/11/2019); TEE without cardioversion (N/A, 04/29/2022); Cardioversion (N/A, 04/29/2022); Esophagogastroduodenoscopy (N/A, 06/21/2022); and cataract surgery (Bilateral). Theresa [name] has a current medication list which includes the following prescription(s): acetaminophen, albuterol, amiodarone, buspirone, butalbital-acetaminophen-caffeine, cholecalciferol, dapagliflozin propanediol, diclofenac sodium, diltiazem, ferrous sulfate, fluticasone-umeclidin-vilant, hydrocodone-acetaminophen, [START ON 11/30/2022] hydrocodone-acetaminophen, [START ON 12/30/2022] hydrocodone-acetaminophen, ipratropium-albuterol, melatonin, naloxone, polyethylene glycol, potassium chloride sa, rivaroxaban, roflumilast, sertraline, simvastatin, spironolactone, torsemide, and varenicline, and the following Facility-Administered Medications: sodium chloride, acetaminophen, amiodarone, buspirone, butalbital-acetaminophen-caffeine, cholecalciferol, diltiazem, ferrous sulfate, fluticasone  furoate-vilanterol **AND** umeclidinium bromide, hydrocodone-acetaminophen, ipratropium-albuterol, melatonin, ondansetron **OR** ondansetron, pantoprazole, potassium chloride sa, prednisone, roflumilast, sertraline, silver nitrate applicators, simvastatin, spironolactone, torsemide, torsemide, varenicline. Her primarily concern today is the No chief complaint on file.  Initial Vital Signs:  Pulse/HCG Rate:    Temp:   Resp:  BP:   SpO2:    BMI: Estimated body mass index is 37.49 kg/m as calculated from the following:   Height as of 11/05/22: _0  (1.575 m).   Weight as of 11/05/22: 205 lb (93 kg).  Risk Assessment: Allergies: Reviewed. She is allergic to gabapentin, codeine, and meloxicam.  Allergy Precautions: None required Coagulopathies: Reviewed. None identified.  Blood-thinner therapy: None at this time Active Infection(s): Reviewed. None identified. Theresa [name] is afebrile  Site Confirmation: Theresa [name] was asked to confirm the procedure and laterality before marking the site Procedure checklist: Completed Consent: Before the procedure and under the influence of no sedative(s), amnesic(s), or anxiolytics, the patient was informed of the treatment options, risks and possible complications. To fulfill our ethical and legal obligations, as recommended by the American Medical Association's Code of Ethics, I have informed the patient of my clinical impression; the nature and purpose of the treatment or procedure; the risks, benefits, and possible complications of the intervention; the alternatives, including doing nothing; the risk(s) and benefit(s) of the alternative treatment(s) or procedure(s); and the risk(s) and benefit(s) of doing nothing. The patient was provided information about the general risks and possible complications associated with the procedure. These may include, but are not limited to: failure to achieve desired goals, infection, bleeding, organ or nerve damage, allergic  reactions, paralysis, and death. In addition, the patient was informed of those risks and complications associated to Spine-related procedures, such as failure to decrease pain; infection (i.e.: Meningitis, epidural or intraspinal abscess); bleeding (i.e.: epidural hematoma, subarachnoid hemorrhage, or any other type of intraspinal or peri-dural bleeding); organ or nerve damage (i.e.: Any type of peripheral nerve, nerve root, or spinal cord injury) with subsequent damage to sensory, motor, and/or autonomic systems, resulting in permanent pain, numbness, and/or weakness of one or several areas of the body; allergic reactions; (i.e.: anaphylactic reaction); and/or death. Furthermore, the patient was informed of those risks and complications associated with the medications. These include, but are not limited to: allergic reactions (i.e.: anaphylactic or anaphylactoid reaction(s)); adrenal axis suppression; blood sugar elevation that in diabetics may result in ketoacidosis or comma; water retention that in patients with history of congestive heart failure may result in shortness of breath, pulmonary edema, and decompensation with resultant heart failure; weight gain; swelling or edema; medication-induced neural toxicity; particulate matter embolism and blood vessel occlusion with resultant organ, and/or nervous system infarction; and/or aseptic necrosis of one or more joints. Finally, the patient was informed that Medicine is not an exact science; therefore, there is also the possibility of unforeseen or unpredictable risks and/or possible complications that may result in a catastrophic outcome. The patient indicated having understood very clearly. We have given the patient no guarantees and we have made no promises. Enough time was given to the patient to ask questions, all of which were answered to the patient's satisfaction. Ms. Kerwin has indicated that she wanted to continue with the procedure. Attestation: I,  the ordering provider, attest that I have discussed with the patient the benefits, risks, side-effects, alternatives, likelihood of achieving goals, and potential problems during recovery for the procedure that I have provided informed consent. Date  Time: {CHL [facility]-PAIN TIME CHOICES:[id number]}  Pre-Procedure Preparation:  Monitoring: As per clinic protocol. Respiration, ETCO2, SpO2, BP, heart rate and rhythm monitor placed and checked for adequate function Safety Precautions: Patient was assessed for positional comfort and pressure points before starting the procedure. Time-out: I initiated and conducted the "Time-out" before starting the procedure, as per  protocol. The patient was asked to participate by confirming the accuracy of the "Time Out" information. Verification of the correct person, site, and procedure were performed and confirmed by me, the nursing staff, and the patient. "Time-out" conducted as per Joint Commission's Universal Protocol (UP.01.01.01). Time:    Description of Procedure:          Laterality: Right Levels:  L2, L3, L4, L5, and S1 Medial Branch Level(s). Safety Precautions: Aspiration looking for blood return was conducted prior to all injections. At no point did we inject any substances, as a needle was being advanced. Before injecting, the patient was told to immediately notify me if she was experiencing any new onset of "ringing in the ears, or metallic taste in the mouth". No attempts were made at seeking any paresthesias. Safe injection practices and needle disposal techniques used. Medications properly checked for expiration dates. SDV (single dose vial) medications used. After the completion of the procedure, all disposable equipment used was discarded in the proper designated medical waste containers. Local Anesthesia: Protocol guidelines were followed. The patient was positioned over the fluoroscopy table. The area was prepped in the usual manner. The time-out was  completed. The target area was identified using fluoroscopy. A 12-in long, straight, sterile hemostat was used with fluoroscopic guidance to locate the targets for each level blocked. Once located, the skin was marked with an approved surgical skin marker. Once all sites were marked, the skin (epidermis, dermis, and hypodermis), as well as deeper tissues (fat, connective tissue and muscle) were infiltrated with a small amount of a short-acting local anesthetic, loaded on a 10cc syringe with a 25G, 1.5-in  Needle. An appropriate amount of time was allowed for local anesthetics to take effect before proceeding to the next step. Technical description of process:  Radiofrequency Ablation (RFA) L2 Medial Branch Nerve RFA: The target area for the L2 medial branch is at the junction of the postero-lateral aspect of the superior articular process and the superior, posterior, and medial edge of the transverse process of L3. Under fluoroscopic guidance, a Radiofrequency needle was inserted until contact was made with os over the superior postero-lateral aspect of the pedicular shadow (target area). Sensory and motor testing was conducted to properly adjust the position of the needle. Once satisfactory placement of the needle was achieved, the numbing solution was slowly injected after negative aspiration for blood. 2.0 mL of the nerve block solution was injected without difficulty or complication. After waiting for at least 3 minutes, the ablation was performed. Once completed, the needle was removed intact. L3 Medial Branch Nerve RFA: The target area for the L3 medial branch is at the junction of the postero-lateral aspect of the superior articular process and the superior, posterior, and medial edge of the transverse process of L4. Under fluoroscopic guidance, a Radiofrequency needle was inserted until contact was made with os over the superior postero-lateral aspect of the pedicular shadow (target area). Sensory and  motor testing was conducted to properly adjust the position of the needle. Once satisfactory placement of the needle was achieved, the numbing solution was slowly injected after negative aspiration for blood. 2.0 mL of the nerve block solution was injected without difficulty or complication. After waiting for at least 3 minutes, the ablation was performed. Once completed, the needle was removed intact. L4 Medial Branch Nerve RFA: The target area for the L4 medial branch is at the junction of the postero-lateral aspect of the superior articular process and the superior, posterior, and medial  edge of the transverse process of L5. Under fluoroscopic guidance, a Radiofrequency needle was inserted until contact was made with os over the superior postero-lateral aspect of the pedicular shadow (target area). Sensory and motor testing was conducted to properly adjust the position of the needle. Once satisfactory placement of the needle was achieved, the numbing solution was slowly injected after negative aspiration for blood. 2.0 mL of the nerve block solution was injected without difficulty or complication. After waiting for at least 3 minutes, the ablation was performed. Once completed, the needle was removed intact. L5 Medial Branch Nerve RFA: The target area for the L5 medial branch is at the junction of the postero-lateral aspect of the superior articular process of S1 and the superior, posterior, and medial edge of the sacral ala. Under fluoroscopic guidance, a Radiofrequency needle was inserted until contact was made with os over the superior postero-lateral aspect of the pedicular shadow (target area). Sensory and motor testing was conducted to properly adjust the position of the needle. Once satisfactory placement of the needle was achieved, the numbing solution was slowly injected after negative aspiration for blood. 2.0 mL of the nerve block solution was injected without difficulty or complication. After  waiting for at least 3 minutes, the ablation was performed. Once completed, the needle was removed intact. S1 Medial Branch Nerve RFA: The target area for the S1 medial branch is located inferior to the junction of the S1 superior articular process and the L5 inferior articular process, posterior, inferior, and lateral to the 6 o'clock position of the L5-S1 facet joint, just superior to the S1 posterior foramen. Under fluoroscopic guidance, the Radiofrequency needle was advanced until contact was made with os over the Target area. Sensory and motor testing was conducted to properly adjust the position of the needle. Once satisfactory placement of the needle was achieved, the numbing solution was slowly injected after negative aspiration for blood. 2.0 mL of the nerve block solution was injected without difficulty or complication. After waiting for at least 3 minutes, the ablation was performed. Once completed, the needle was removed intact. Radiofrequency lesioning (ablation):  Radiofrequency Generator: Medtronic AccurianTM AG 1000 RF Generator Sensory Stimulation Parameters: 50 Hz was used to locate & identify the nerve, making sure that the needle was positioned such that there was no sensory stimulation below 0.3 V or above 0.7 V. Motor Stimulation Parameters: 2 Hz was used to evaluate the motor component. Care was taken not to lesion any nerves that demonstrated motor stimulation of the lower extremities at an output of less than 2.5 times that of the sensory threshold, or a maximum of 2.0 V. Lesioning Technique Parameters: Standard Radiofrequency settings. (Not bipolar or pulsed.) Temperature Settings: 80 degrees C Lesioning time: 60 seconds Intra-operative Compliance: Compliant  Once the entire procedure was completed, the treated area was cleaned, making sure to leave some of the prepping solution back to take advantage of its long term bactericidal properties.    Illustration of the posterior  view of the lumbar spine and the posterior neural structures. Laminae of L2 through S1 are labeled. DPRL5, dorsal primary ramus of L5; DPRS1, dorsal primary ramus of S1; DPR3, dorsal primary ramus of L3; FJ, facet (zygapophyseal) joint L3-L4; I, inferior articular process of L4; LB1, lateral branch of dorsal primary ramus of L1; IAB, inferior articular branches from L3 medial branch (supplies L4-L5 facet joint); IBP, intermediate branch plexus; MB3, medial branch of dorsal primary ramus of L3; NR3, third lumbar nerve root; S, superior  articular process of L5; SAB, superior articular branches from L4 (supplies L4-5 facet joint also); TP3, transverse process of L3.  There were no vitals filed for this visit.  Start Time:   hrs. End Time:   hrs.  Imaging Guidance (Spinal):          Type of Imaging Technique: Fluoroscopy Guidance (Spinal) Indication(s): Assistance in needle guidance and placement for procedures requiring needle placement in or near specific anatomical locations not easily accessible without such assistance. Exposure Time: Please see nurses notes. Contrast: None used. Fluoroscopic Guidance: I was personally present during the use of fluoroscopy. "Tunnel Vision Technique" used to obtain the best possible view of the target area. Parallax error corrected before commencing the procedure. "Direction-depth-direction" technique used to introduce the needle under continuous pulsed fluoroscopy. Once target was reached, antero-posterior, oblique, and lateral fluoroscopic projection used confirm needle placement in all planes. Images permanently stored in EMR. Interpretation: No contrast injected. I personally interpreted the imaging intraoperatively. Adequate needle placement confirmed in multiple planes. Permanent images saved into the patient's record.  Antibiotic Prophylaxis:   Anti-infectives (From admission, onward)    None      Indication(s): None identified  Post-operative  Assessment:  Post-procedure Vital Signs:  Pulse/HCG Rate:    Temp:   Resp:   BP:   SpO2:    EBL: None  Complications: No immediate post-treatment complications observed by team, or reported by patient.  Note: The patient tolerated the entire procedure well. A repeat set of vitals were taken after the procedure and the patient was kept under observation following institutional policy, for this type of procedure. Post-procedural neurological assessment was performed, showing return to baseline, prior to discharge. The patient was provided with post-procedure discharge instructions, including a section on how to identify potential problems. Should any problems arise concerning this procedure, the patient was given instructions to immediately contact us, at any time, without hesitation. In any case, we plan to contact the patient by telephone for a follow-up status report regarding this interventional procedure.  Comments:  No additional relevant information.  Plan of Care  Orders:  No orders of the defined types were placed in this encounter.  Chronic Opioid Analgesic:  Hydrocodone/APAP 5/325 one tablet every 8 hours (15 mg/day of hydrocodone) MME/day: 15 mg/day.   Medications ordered for procedure: No orders of the defined types were placed in this encounter.  Medications administered: [name] had no medications administered during this visit.  See the medical record for exact dosing, route, and time of administration.  Follow-up plan:   No follow-ups on file.       Interventional Therapies  Risk  Complexity Considerations:   NOTE: NO further Lumbar RFA until BMI <35.   Planned  Pending:   Therapeutic right (L4-5, L5-S1) lumbar facet RFA (L3, L4, L5, and S1 medial branch) #2 (last done on 10/03/2017) (pending clearance to stop Xarelto x 3 days)   Under consideration:   Possible left lumbar facet RFA #2 (last done on 2018)  Diagnostic bilateral cervical facet MBB #1   Palliative left L2-3 LESI #4    Completed:   Palliative right lumbar facet MBB x7 (09/20/2022) (100/100/100x5days/0)  Palliative left lumbar facet MBB x6 (11/11/2021) (100/100/0/0)  Therapeutic left L2-3 LESI x3 (09/23/2021) (80/80/80)  Diagnostic/palliative left L4 TFESI x1 (12/26/2017) (100/100/50/>50)  Diagnostic/palliative left L4-5 LESI x2 (07/30/2019) (100/100/0/0)  Diagnostic/palliative right L4 TFESI x1 (01/25/2018) (100/100/50/50)  Diagnostic/palliative right L4-5 LESI x1 (01/25/2018) (100/100/50/50)  Palliative left lumbar facet RFA  x1 (06/27/2017) (0/0/100/>50)  Palliative right lumbar facet RFA x1 (10/03/2017) (100/100/75)    Therapeutic  Palliative (PRN) options:   Therapeutic lumbar facet MBB   Therapeutic left L2 LESI  Therapeutic lumbar facet RFA     Pharmacotherapy  Nonopioids transferred 08/03/2020: Gabapentin and baclofen       Recent Visits Date Type Provider Dept  10/26/22 Office Visit Milinda Pointer, MD [facility]  10/04/22 Office Visit Milinda Pointer, MD [facility]  09/20/22 Procedure visit Milinda Pointer, MD [facility]  Showing recent visits within past 90 days and meeting all other requirements Future Appointments Date Type Provider Dept  11/10/22 Appointment Milinda Pointer, [facility]  01/25/23 Appointment Milinda Pointer, MD [facility]  Showing future appointments within next 90 days and meeting all other requirements  Disposition: Discharge home  Discharge (Date  Time): 11/10/2022;   hrs.   Primary Care Physician: Birdie Sons, MD Location: [facility] Note by: Gaspar Cola, MD Date: 11/10/2022; Time: 10:40 AM  Disclaimer:  Medicine is not an Chief Strategy Officer. The only guarantee in medicine is that nothing is guaranteed. It is important to note that the decision to proceed with this intervention was based on the information collected  from the patient. The Data and conclusions were drawn from the patient's questionnaire, the interview, and the physical examination. Because the information was provided in large part by the patient, it cannot be guaranteed that it has not been purposely or unconsciously manipulated. Every effort has been made to obtain as much relevant data as possible for this evaluation. It is important to note that the conclusions that lead to this procedure are derived in large part from the available data. Always take into account that the treatment will also be dependent on availability of resources and existing treatment guidelines, considered by other Pain Management Practitioners as being common knowledge and practice, at the time of the intervention. For Medico-Legal purposes, it is also important to point out that variation in procedural techniques and pharmacological choices are the acceptable norm. The indications, contraindications, technique, and results of the above procedure should only be interpreted and judged by a Board-Certified Interventional Pain Specialist with extensive familiarity and expertise in the same exact procedure and technique.

## 2022-11-06 NOTE — Discharge Summary (Signed)
Physician Discharge Summary   Patient: Theresa [name] MRN: [id number] DOB: 1956/02/03  Admit date:     11/05/2022  Discharge date: 11/06/22  Discharge Physician: Max Sane   PCP: Birdie Sons, MD   Recommendations at discharge:   Follow-up with outpatient providers as requested Follow-up with Dr. Melven Sartorius McQueen/ENT office on 1/29 for removal of nasal packing Follow-up with Dr. Tami Ribas on February 14 as scheduled Continue with her schedule iron [facility] with Dr. Tasia Catchings Follow-up with Dr. Callwood/cardiology in next 1 to 2 weeks to decide on long-term anticoagulation/Xarelto  Discharge Diagnoses: Active Problems:   ABLA (acute blood loss anemia)   Epistaxis   Paroxysmal atrial flutter (HCC)   Chronic diastolic heart failure (HCC)   COPD with acute exacerbation (HCC)   Depression with anxiety   Sleep apnea   Obesity (BMI 30-39.9)   GERD (gastroesophageal reflux disease)   Symptomatic anemia  Hospital Course: Assessment and Plan: ABLA (acute blood loss anemia) Epistaxis Secondary to recurrent epistaxis in a patient with a history of atrial flutter on chronic anticoagulation therapy as primary prophylaxis for an acute stroke Baseline hemoglobin is 9 and on admission patient is noted to have a hemoglobin of 7.5 status post 1 PRBC transfusion Hold Xarelto till outpatient follow-up with ENT and/or cardiology nasal packing in place while in the ER which has controlled the bleeding Repeat CBC showed hemoglobin of 7.9.  No further bleeding/epistaxis Discussed with ENT/Dr. Tami Ribas who recommends keeping nasal packing as is and have her follow-up with their office on 11/14/2022 for removal of packing -Prescribed Keflex for prophylaxis  Paroxysmal atrial flutter (HCC) Continue amiodarone and Cardizem for rate control Hold Xarelto due to severe epistaxis resulting in acute blood loss anemia requiring blood transfusion Outpatient follow-up with cardiology and ENT to decide  on long-term anticoagulation  COPD with acute exacerbation ([place]) No further wheezing.  Continue baseline 4 L oxygen.  Patient does not do well with steroids and gets very moody so would like to hold off at discharge.  Chronic diastolic heart failure (HCC) Stable and not acutely exacerbated Last known LVEF of 60-65 from a 2D echocardiogram which was done 07/23 Continue home medications  Depression with anxiety Continue sertraline and buspirone  Sleep apnea Continue CPAP at bedtime  Obesity (BMI 30-39.9) BMI 37 Complicates overall prognosis and care Lifestyle modification and exercise has been discussed with patient in detail  GERD (gastroesophageal reflux disease) Continue PPI         Disposition: Home Diet recommendation:  Discharge Diet Orders (From admission, onward)     Start     Ordered   11/06/22 0000  Diet - low sodium heart healthy        11/06/22 1055           Carb modified diet DISCHARGE MEDICATION: Allergies as of 11/06/2022       Reactions   Gabapentin Other (See Comments)   Higher doses of 800 mg, 600 mg, 300 mg causes excessive sedation and contributes to numerous mechanical falls. (Unsure if 100 mg dose can be tolerated)   Codeine Nausea And Vomiting   Meloxicam Other (See Comments)   Stomach pain        Medication List     STOP taking these medications    rivaroxaban 20 MG Tabs tablet Commonly known as: XARELTO       TAKE these medications    acetaminophen 500 MG tablet Commonly known as: TYLENOL Take 1,000 mg by mouth every 8 (  eight) hours as needed for mild pain or headache.   albuterol 108 (90 Base) MCG/ACT inhaler Commonly known as: VENTOLIN HFA Inhale 2 puffs into the lungs every 6 (six) hours as needed for wheezing or shortness of breath.   amiodarone 200 MG tablet Commonly known as: PACERONE Take 1 tablet (200 mg total) by mouth daily.   busPIRone 5 MG tablet Commonly known as: BUSPAR TAKE 1 TABLET BY MOUTH  TWICE DAILY   butalbital-acetaminophen-caffeine 50-325-40 MG tablet Commonly known as: FIORICET Take 1 tablet by mouth every 6 (six) hours as needed for headache. LIMIT USE TO PREVENT MEDICATION OVERUSE HEADACHE   cephALEXin 500 MG capsule Commonly known as: KEFLEX Take 1 capsule (500 mg total) by mouth 4 (four) times daily for 7 days.   Cholecalciferol 50 MCG (2000 UT) Caps Take 2,000 Units by mouth daily.   dapagliflozin propanediol 10 MG Tabs tablet Commonly known as: Farxiga Take 1 tablet (10 mg total) by mouth daily before breakfast.   diclofenac Sodium 1 % Gel Commonly known as: VOLTAREN Apply 2 g topically 4 (four) times daily as needed (lower back and/or shoulder pain).   diltiazem 180 MG 24 hr capsule Commonly known as: CARDIZEM CD Take 180 mg by mouth daily.   ferrous sulfate 325 (65 FE) MG EC tablet Take 325 mg by mouth daily.   Fluticasone-Umeclidin-Vilant 100-62.5-25 MCG/INH Aepb Inhale 1 puff into the lungs daily.   HYDROcodone-acetaminophen 5-325 MG tablet Commonly known as: NORCO/VICODIN Take 1 tablet by mouth every 8 (eight) hours as needed for severe pain. Must last 30 days   HYDROcodone-acetaminophen 5-325 MG tablet Commonly known as: NORCO/VICODIN Take 1 tablet by mouth every 8 (eight) hours as needed for severe pain. Must last 30 days Start taking on: November 30, 2022   HYDROcodone-acetaminophen 5-325 MG tablet Commonly known as: NORCO/VICODIN Take 1 tablet by mouth every 8 (eight) hours as needed for severe pain. Must last 30 days Start taking on: December 30, 2022   ipratropium-albuterol 0.5-2.5 (3) MG/3ML Soln Commonly known as: DUONEB USE 1 VIAL VIA NEBULIZER EVERY 6 HOURS AS NEEDED FOR SHORTNESS OF BREATH OR WHEEZING   melatonin 5 MG Tabs Take 5 mg by mouth at bedtime.   naloxone 4 MG/0.1ML Liqd nasal spray kit Commonly known as: NARCAN Place 1 spray into the nose as needed for up to 365 doses (for opioid-induced respiratory depresssion).  In case of emergency (overdose), spray once into each nostril. If no response within 3 minutes, repeat application and call [phone].   polyethylene glycol 17 g packet Commonly known as: MIRALAX / GLYCOLAX Take 17 g by mouth daily.   potassium chloride SA 20 MEQ tablet Commonly known as: KLOR-CON M Take 2 tablets every morning and 1 tablet every evening   roflumilast 500 MCG Tabs tablet Commonly known as: DALIRESP Take 500 mcg by mouth daily.   sertraline 100 MG tablet Commonly known as: ZOLOFT TAKE 2 TABLETS BY MOUTH ONCE EVERY MORNING   simvastatin 20 MG tablet Commonly known as: ZOCOR Take 20 mg by mouth at bedtime.   spironolactone 25 MG tablet Commonly known as: ALDACTONE Take 25 mg by mouth daily.   torsemide 20 MG tablet Commonly known as: DEMADEX Take 40-80 mg by mouth See admin instructions. Take 4 tablets (26m) by mouth every morning and take 2 tablets (462m by mouth every afternoon   varenicline 1 MG tablet Commonly known as: CHANTIX Take 1 mg by mouth 2 (two) times daily.  Follow-up Information     Fisher, Kirstie Peri, MD. Schedule an appointment as soon as possible for a visit in 3 day(s).   Specialty: Family Medicine Why: [facility] Discharge F/UP Contact information: [address] [address] [phone]         Yolonda Kida, MD. Schedule an appointment as soon as possible for a visit in 1 week(s).   Specialties: Cardiology, Internal Medicine Why: [facility] Discharge F/UP        Beverly Gust, MD. Schedule an appointment as soon as possible for a visit on 11/30/2022.   Specialty: Otolaryngology Why: as scheduled Contact information: [address] [place] [address] [phone]                Discharge Exam: Danley Danker Weights   11/05/22 1314  Weight: 44 kg   66 year old female lying in the bed comfortably without any acute distress Nose: Nasal cannula in place.   Packing in the right nasal cavity Lungs decreased breath sounds at the bases Cardiovascular irregularly irregular heart rate Abdomen soft, benign Neuro alert and awake, nonfocal  Condition at discharge: fair  The results of significant diagnostics from this hospitalization (including imaging, microbiology, ancillary and laboratory) are listed below for reference.   Imaging Studies: DG Chest Portable 1 View  Result Date: 11/05/2022 CLINICAL DATA:  Patient with cough.  COPD. EXAM: PORTABLE CHEST 1 VIEW COMPARISON:  Chest radiograph 06/19/2022 FINDINGS: Stable enlarged cardiac and mediastinal contours. Aortic tortuosity. Basilar heterogeneous opacities. No pleural effusion or pneumothorax. Thoracic spine degenerative changes. IMPRESSION: Basilar atelectasis. Electronically Signed   By: Lovey Newcomer M.D.   On: 11/05/2022 14:05    Microbiology: Results for orders placed or performed during the hospital encounter of 04/23/22  MRSA Next Gen by PCR, Nasal     Status: None   Collection Time: 04/24/22  3:28 PM   Specimen: Nasal Mucosa; Nasal Swab  Result Value Ref Range Status   MRSA by PCR Next Gen NOT DETECTED NOT DETECTED Final    Comment: (NOTE) The GeneXpert MRSA Assay (FDA approved for NASAL specimens only), is one component of a comprehensive MRSA colonization surveillance program. It is not intended to diagnose MRSA infection nor to guide or monitor treatment for MRSA infections. Test performance is not FDA approved in patients less than 45 years old. Performed at [facility], [address] [place], [place] [address]   SARS Coronavirus 2 by RT PCR (hospital order, performed in [facility] hospital lab) *cepheid single result test*     Status: None   Collection Time: 04/27/22 12:00 PM  Result Value Ref Range Status   SARS Coronavirus 2 by RT PCR NEGATIVE NEGATIVE Final    Comment: (NOTE) SARS-CoV-2 target nucleic acids are NOT DETECTED.  The SARS-CoV-2 RNA is generally  detectable in upper and lower respiratory specimens during the acute phase of infection. The lowest concentration of SARS-CoV-2 viral copies this assay can detect is 250 copies / mL. A negative result does not preclude SARS-CoV-2 infection and should not be used as the sole basis for treatment or other patient management decisions.  A negative result may occur with improper specimen collection / handling, submission of specimen other than nasopharyngeal swab, presence of viral mutation(s) within the areas targeted by this assay, and inadequate number of viral copies (<250 copies / mL). A negative result must be combined with clinical observations, patient history, and epidemiological information.  Fact Sheet for Patients:  https://www.patel.info/  Fact Sheet for Healthcare Providers: https://hall.com/  This test is not yet approved or  cleared by the [place] FDA and has been authorized for detection and/or diagnosis of SARS-CoV-2 by FDA under an Emergency Use Authorization (EUA).  This EUA will remain in effect (meaning this test can be used) for the duration of the COVID-19 declaration under Section 564(b)(1) of the Act, 21 U.S.C. section 360bbb-3(b)(1), unless the authorization is terminated or revoked sooner.  Performed at [facility], [address] [place], [place] [address]     Labs: CBC: Recent Labs  Lab 11/05/22 1359 11/06/22 0444  WBC 10.4 8.4  NEUTROABS 8.9*  --   HGB 7.5* 7.9*  HCT 28.4* 28.5*  MCV 97.6 96.3  PLT 239 552   Basic Metabolic Panel: Recent Labs  Lab 11/05/22 1359 11/06/22 0444  NA 138 137  K 3.7 3.7  CL 103 103  CO2 27 27  GLUCOSE 104* 141*  BUN 11 14  CREATININE 0.66 0.71  CALCIUM 8.8* 8.1*   Liver Function Tests: Recent Labs  Lab 11/05/22 1359  AST 23  ALT 18  ALKPHOS 82  BILITOT 0.4  PROT 6.7  ALBUMIN 3.5   CBG: No results for input(s): "GLUCAP" in the last 168  hours.  Discharge time spent: greater than 30 minutes.  Signed: Max Sane, MD [facility] 11/06/2022

## 2022-11-06 NOTE — Progress Notes (Addendum)
Patient admitted overnight for severe epistaxis resulting in acute blood loss anemia requiring transfusion of 1 unit of packed RBC.  She is on chronic anticoagulation therapy with Xarelto for a flutter.  Discussed with cardiologist on-call, Dr Saralyn Pilar who recommends to hold Xarelto and have patient follow-up with her primary cardiologist to decide if she continues to require long-term anticoagulation.  Patient already has a scheduled follow-up appointment with ENT for February 14 and will need to keep that appointment.

## 2022-11-07 ENCOUNTER — Emergency Department: Payer: Medicare HMO

## 2022-11-07 ENCOUNTER — Encounter: Payer: Self-pay | Admitting: Internal Medicine

## 2022-11-07 ENCOUNTER — Observation Stay
Admission: EM | Admit: 2022-11-07 | Discharge: 2022-11-09 | Disposition: A | Discharge status: Home / Self Care | Payer: Medicare HMO | Attending: Internal Medicine | Admitting: Internal Medicine

## 2022-11-07 DIAGNOSIS — Z79899 Other long term (current) drug therapy: Secondary | ICD-10-CM | POA: Insufficient documentation

## 2022-11-07 DIAGNOSIS — F17200 Nicotine dependence, unspecified, uncomplicated: Secondary | ICD-10-CM | POA: Diagnosis present

## 2022-11-07 DIAGNOSIS — D62 Acute posthemorrhagic anemia: Secondary | ICD-10-CM | POA: Diagnosis present

## 2022-11-07 DIAGNOSIS — E669 Obesity, unspecified: Secondary | ICD-10-CM | POA: Diagnosis present

## 2022-11-07 DIAGNOSIS — Z743 Need for continuous supervision: Secondary | ICD-10-CM | POA: Diagnosis not present

## 2022-11-07 DIAGNOSIS — I509 Heart failure, unspecified: Secondary | ICD-10-CM | POA: Diagnosis not present

## 2022-11-07 DIAGNOSIS — E782 Mixed hyperlipidemia: Secondary | ICD-10-CM | POA: Diagnosis present

## 2022-11-07 DIAGNOSIS — G4730 Sleep apnea, unspecified: Secondary | ICD-10-CM | POA: Insufficient documentation

## 2022-11-07 DIAGNOSIS — R58 Hemorrhage, not elsewhere classified: Secondary | ICD-10-CM | POA: Diagnosis not present

## 2022-11-07 DIAGNOSIS — F4542 Pain disorder with related psychological factors: Secondary | ICD-10-CM | POA: Diagnosis present

## 2022-11-07 DIAGNOSIS — K219 Gastro-esophageal reflux disease without esophagitis: Secondary | ICD-10-CM | POA: Diagnosis present

## 2022-11-07 DIAGNOSIS — I10 Essential (primary) hypertension: Secondary | ICD-10-CM | POA: Diagnosis present

## 2022-11-07 DIAGNOSIS — I480 Paroxysmal atrial fibrillation: Secondary | ICD-10-CM | POA: Diagnosis not present

## 2022-11-07 DIAGNOSIS — Z87891 Personal history of nicotine dependence: Secondary | ICD-10-CM | POA: Diagnosis not present

## 2022-11-07 DIAGNOSIS — R040 Epistaxis: Principal | ICD-10-CM | POA: Diagnosis present

## 2022-11-07 DIAGNOSIS — I110 Hypertensive heart disease with heart failure: Secondary | ICD-10-CM | POA: Insufficient documentation

## 2022-11-07 DIAGNOSIS — R053 Chronic cough: Secondary | ICD-10-CM | POA: Diagnosis not present

## 2022-11-07 DIAGNOSIS — D72829 Elevated white blood cell count, unspecified: Secondary | ICD-10-CM | POA: Diagnosis present

## 2022-11-07 DIAGNOSIS — I272 Pulmonary hypertension, unspecified: Secondary | ICD-10-CM | POA: Diagnosis not present

## 2022-11-07 DIAGNOSIS — R0602 Shortness of breath: Secondary | ICD-10-CM | POA: Diagnosis not present

## 2022-11-07 DIAGNOSIS — E785 Hyperlipidemia, unspecified: Secondary | ICD-10-CM | POA: Diagnosis present

## 2022-11-07 DIAGNOSIS — R000 Tachycardia, unspecified: Secondary | ICD-10-CM | POA: Diagnosis not present

## 2022-11-07 DIAGNOSIS — J441 Chronic obstructive pulmonary disease with (acute) exacerbation: Secondary | ICD-10-CM | POA: Diagnosis not present

## 2022-11-07 DIAGNOSIS — R062 Wheezing: Secondary | ICD-10-CM | POA: Diagnosis not present

## 2022-11-07 DIAGNOSIS — I959 Hypotension, unspecified: Secondary | ICD-10-CM | POA: Diagnosis not present

## 2022-11-07 DIAGNOSIS — Z20822 Contact with and (suspected) exposure to covid-19: Secondary | ICD-10-CM | POA: Diagnosis not present

## 2022-11-07 DIAGNOSIS — G4733 Obstructive sleep apnea (adult) (pediatric): Secondary | ICD-10-CM | POA: Diagnosis present

## 2022-11-07 DIAGNOSIS — R519 Headache, unspecified: Secondary | ICD-10-CM | POA: Diagnosis not present

## 2022-11-07 DIAGNOSIS — I4892 Unspecified atrial flutter: Secondary | ICD-10-CM | POA: Diagnosis present

## 2022-11-07 DIAGNOSIS — D649 Anemia, unspecified: Secondary | ICD-10-CM | POA: Diagnosis present

## 2022-11-07 LAB — TYPE AND SCREEN
ABO/RH(D): A POS
ABO/RH(D): A POS
Antibody Screen: NEGATIVE
Antibody Screen: NEGATIVE
Unit division: 0

## 2022-11-07 LAB — CBC WITH DIFFERENTIAL/PLATELET
Abs Immature Granulocytes: 0.13 K/uL — ABNORMAL HIGH (ref 0.00–0.07)
Basophils Absolute: 0 K/uL (ref 0.0–0.1)
Basophils Relative: 0 %
Eosinophils Absolute: 0 K/uL (ref 0.0–0.5)
Eosinophils Relative: 0 %
HCT: 32.1 % — ABNORMAL LOW (ref 36.0–46.0)
Hemoglobin: 8.7 g/dL — ABNORMAL LOW (ref 12.0–15.0)
Immature Granulocytes: 1 %
Lymphocytes Relative: 10 %
Lymphs Abs: 1.1 K/uL (ref 0.7–4.0)
MCH: 27.2 pg (ref 26.0–34.0)
MCHC: 27.1 g/dL — ABNORMAL LOW (ref 30.0–36.0)
MCV: 100.3 fL — ABNORMAL HIGH (ref 80.0–100.0)
Monocytes Absolute: 0.7 K/uL (ref 0.1–1.0)
Monocytes Relative: 7 %
Neutro Abs: 9.2 K/uL — ABNORMAL HIGH (ref 1.7–7.7)
Neutrophils Relative %: 82 %
Platelets: 244 K/uL (ref 150–400)
RBC: 3.2 MIL/uL — ABNORMAL LOW (ref 3.87–5.11)
RDW: 24 % — ABNORMAL HIGH (ref 11.5–15.5)
Smear Review: NORMAL
WBC: 11.3 K/uL — ABNORMAL HIGH (ref 4.0–10.5)
nRBC: 0.2 % (ref 0.0–0.2)

## 2022-11-07 LAB — BPAM RBC
Blood Product Expiration Date: 202402212359
ISSUE DATE / TIME: 202401201659
Unit Type and Rh: 6200

## 2022-11-07 LAB — URINALYSIS, COMPLETE (UACMP) WITH MICROSCOPIC
Bacteria, UA: NONE SEEN
Bilirubin Urine: NEGATIVE
Glucose, UA: >=500 mg/dL — AB
Hgb urine dipstick: NEGATIVE
Ketones, ur: NEGATIVE mg/dL
Leukocytes,Ua: NEGATIVE
Nitrite: NEGATIVE
Protein, ur: NEGATIVE mg/dL
Specific Gravity, Urine: 1.015 (ref 1.005–1.030)
pH: 6 (ref 5.0–8.0)

## 2022-11-07 LAB — BASIC METABOLIC PANEL
Anion gap: 10 (ref 5–15)
BUN: 13 mg/dL (ref 8–23)
CO2: 30 mmol/L (ref 22–32)
Calcium: 8.9 mg/dL (ref 8.9–10.3)
Chloride: 103 mmol/L (ref 98–111)
Creatinine, Ser: 0.84 mg/dL (ref 0.44–1.00)
GFR, Estimated: >60 mL/min (ref >60)
Glucose, Bld: 98 mg/dL (ref 70–99)
Potassium: 3.4 mmol/L — ABNORMAL LOW (ref 3.5–5.1)
Sodium: 143 mmol/L (ref 135–145)

## 2022-11-07 LAB — PROCALCITONIN: Procalcitonin: <0.1 ng/mL

## 2022-11-07 LAB — RESP PANEL BY RT-PCR (RSV, FLU A&B, COVID)  RVPGX2
Influenza A by PCR: NEGATIVE
Influenza B by PCR: NEGATIVE
Resp Syncytial Virus by PCR: NEGATIVE
SARS Coronavirus 2 by RT PCR: NEGATIVE

## 2022-11-07 LAB — TROPONIN I (HIGH SENSITIVITY)
Troponin I (High Sensitivity): 7 ng/L
Troponin I (High Sensitivity): 7 ng/L (ref <18)

## 2022-11-07 MED ORDER — METHYLPREDNISOLONE SODIUM SUCC 125 MG IJ SOLR
125.0000 mg | Freq: Once | INTRAMUSCULAR | Status: AC
  Administered 2022-11-07: 125 mg via INTRAVENOUS
  Filled 2022-11-07: qty 2

## 2022-11-07 MED ORDER — NICOTINE 14 MG/24HR TD PT24: 14.0000 mg | MEDICATED_PATCH | Freq: Every day | TRANSDERMAL | Status: DC | PRN

## 2022-11-07 MED ORDER — ACETAMINOPHEN 500 MG PO TABS
1000.0000 mg | ORAL_TABLET | Freq: Once | ORAL | Status: AC
  Administered 2022-11-07: 1000 mg via ORAL
  Filled 2022-11-07: qty 2

## 2022-11-07 MED ORDER — SPIRONOLACTONE 25 MG PO TABS
25.0000 mg | ORAL_TABLET | Freq: Every day | ORAL | Status: DC
  Administered 2022-11-07 – 2022-11-09 (×3): 25 mg via ORAL
  Filled 2022-11-07 (×3): qty 1

## 2022-11-07 MED ORDER — ACETAMINOPHEN 650 MG RE SUPP: 650.0000 mg | Freq: Four times a day (QID) | RECTAL | Status: DC | PRN

## 2022-11-07 MED ORDER — PANTOPRAZOLE SODIUM 40 MG PO TBEC
80.0000 mg | DELAYED_RELEASE_TABLET | Freq: Every day | ORAL | Status: DC
  Administered 2022-11-07 – 2022-11-09 (×3): 80 mg via ORAL
  Filled 2022-11-07 (×3): qty 2

## 2022-11-07 MED ORDER — DOXYCYCLINE HYCLATE 100 MG PO TABS: 100.0000 mg | ORAL_TABLET | Freq: Once | ORAL | Status: DC

## 2022-11-07 MED ORDER — MELATONIN 5 MG PO TABS
5.0000 mg | ORAL_TABLET | Freq: Every day | ORAL | Status: DC
  Administered 2022-11-07 – 2022-11-08 (×2): 5 mg via ORAL
  Filled 2022-11-07 (×2): qty 1

## 2022-11-07 MED ORDER — MORPHINE SULFATE (PF) 2 MG/ML IV SOLN: 2.0000 mg | INTRAVENOUS | Status: DC | PRN

## 2022-11-07 MED ORDER — VARENICLINE TARTRATE 0.5 MG PO TABS
1.0000 mg | ORAL_TABLET | Freq: Two times a day (BID) | ORAL | Status: DC
  Administered 2022-11-07 – 2022-11-09 (×4): 1 mg via ORAL
  Filled 2022-11-07 (×5): qty 2

## 2022-11-07 MED ORDER — NICOTINE 21 MG/24HR TD PT24: 21.0000 mg | MEDICATED_PATCH | Freq: Every day | TRANSDERMAL | Status: DC | PRN

## 2022-11-07 MED ORDER — BUTALBITAL-APAP-CAFFEINE 50-325-40 MG PO TABS
1.0000 | ORAL_TABLET | Freq: Four times a day (QID) | ORAL | Status: DC | PRN
  Administered 2022-11-07 – 2022-11-09 (×3): 1 via ORAL
  Filled 2022-11-07 (×3): qty 1

## 2022-11-07 MED ORDER — ATORVASTATIN CALCIUM 10 MG PO TABS
10.0000 mg | ORAL_TABLET | Freq: Every day | ORAL | Status: DC
  Administered 2022-11-08: 10 mg via ORAL
  Filled 2022-11-07: qty 1

## 2022-11-07 MED ORDER — SIMVASTATIN 10 MG PO TABS
20.0000 mg | ORAL_TABLET | Freq: Every day | ORAL | Status: DC
  Administered 2022-11-07: 20 mg via ORAL
  Filled 2022-11-07: qty 2

## 2022-11-07 MED ORDER — IPRATROPIUM-ALBUTEROL 0.5-2.5 (3) MG/3ML IN SOLN
9.0000 mL | Freq: Once | RESPIRATORY_TRACT | Status: AC
  Administered 2022-11-07: 9 mL via RESPIRATORY_TRACT
  Filled 2022-11-07: qty 9

## 2022-11-07 MED ORDER — AMOXICILLIN-POT CLAVULANATE 875-125 MG PO TABS
1.0000 | ORAL_TABLET | Freq: Once | ORAL | Status: AC
  Administered 2022-11-07: 1 via ORAL
  Filled 2022-11-07: qty 1

## 2022-11-07 MED ORDER — SENNOSIDES-DOCUSATE SODIUM 8.6-50 MG PO TABS
1.0000 | ORAL_TABLET | Freq: Every evening | ORAL | Status: DC | PRN
  Administered 2022-11-09: 1 via ORAL
  Filled 2022-11-07: qty 1

## 2022-11-07 MED ORDER — ONDANSETRON HCL 4 MG/2ML IJ SOLN: 4.0000 mg | Freq: Four times a day (QID) | INTRAMUSCULAR | Status: DC | PRN

## 2022-11-07 MED ORDER — ONDANSETRON HCL 4 MG PO TABS: 4.0000 mg | ORAL_TABLET | Freq: Four times a day (QID) | ORAL | Status: DC | PRN

## 2022-11-07 MED ORDER — MORPHINE SULFATE (PF) 4 MG/ML IV SOLN
4.0000 mg | Freq: Once | INTRAVENOUS | Status: AC
  Administered 2022-11-07: 4 mg via INTRAVENOUS
  Filled 2022-11-07: qty 1

## 2022-11-07 MED ORDER — ONDANSETRON HCL 4 MG/2ML IJ SOLN
4.0000 mg | Freq: Once | INTRAMUSCULAR | Status: AC
  Administered 2022-11-07: 4 mg via INTRAVENOUS
  Filled 2022-11-07: qty 2

## 2022-11-07 MED ORDER — FERROUS SULFATE 325 (65 FE) MG PO TABS
325.0000 mg | ORAL_TABLET | Freq: Every day | ORAL | Status: DC
  Filled 2022-11-07: qty 1

## 2022-11-07 MED ORDER — AMIODARONE HCL 200 MG PO TABS
200.0000 mg | ORAL_TABLET | Freq: Every day | ORAL | Status: DC
  Administered 2022-11-07 – 2022-11-09 (×3): 200 mg via ORAL
  Filled 2022-11-07 (×3): qty 1

## 2022-11-07 MED ORDER — HYDROCODONE-ACETAMINOPHEN 5-325 MG PO TABS: 1.0000 | ORAL_TABLET | Freq: Three times a day (TID) | ORAL | Status: DC | PRN

## 2022-11-07 MED ORDER — SERTRALINE HCL 50 MG PO TABS
100.0000 mg | ORAL_TABLET | Freq: Every day | ORAL | Status: DC
  Administered 2022-11-07 – 2022-11-08 (×2): 100 mg via ORAL
  Filled 2022-11-07 (×2): qty 2

## 2022-11-07 MED ORDER — BUSPIRONE HCL 10 MG PO TABS
5.0000 mg | ORAL_TABLET | Freq: Two times a day (BID) | ORAL | Status: DC
  Administered 2022-11-07 – 2022-11-09 (×4): 5 mg via ORAL
  Filled 2022-11-07 (×4): qty 1

## 2022-11-07 MED ORDER — DILTIAZEM HCL ER COATED BEADS 180 MG PO CP24
180.0000 mg | ORAL_CAPSULE | Freq: Every day | ORAL | Status: DC
  Administered 2022-11-07 – 2022-11-09 (×3): 180 mg via ORAL
  Filled 2022-11-07 (×3): qty 1

## 2022-11-07 MED ORDER — OXYMETAZOLINE HCL 0.05 % NA SOLN
1.0000 | Freq: Once | NASAL | Status: AC
  Administered 2022-11-07: 1 via NASAL
  Filled 2022-11-07: qty 30

## 2022-11-07 MED ORDER — METHYLPREDNISOLONE SODIUM SUCC 40 MG IJ SOLR
40.0000 mg | Freq: Two times a day (BID) | INTRAMUSCULAR | Status: DC
  Administered 2022-11-08: 40 mg via INTRAVENOUS
  Filled 2022-11-07: qty 1

## 2022-11-07 MED ORDER — ACETAMINOPHEN 325 MG PO TABS: 650.0000 mg | ORAL_TABLET | Freq: Four times a day (QID) | ORAL | Status: DC | PRN

## 2022-11-07 MED ORDER — HYDROCODONE-ACETAMINOPHEN 5-325 MG PO TABS
1.0000 | ORAL_TABLET | Freq: Three times a day (TID) | ORAL | Status: DC | PRN
  Administered 2022-11-08: 1 via ORAL
  Filled 2022-11-07: qty 1

## 2022-11-07 MED ORDER — IPRATROPIUM-ALBUTEROL 0.5-2.5 (3) MG/3ML IN SOLN
3.0000 mL | Freq: Three times a day (TID) | RESPIRATORY_TRACT | Status: DC
  Administered 2022-11-07 – 2022-11-09 (×6): 3 mL via RESPIRATORY_TRACT
  Filled 2022-11-07 (×6): qty 3

## 2022-11-07 NOTE — Assessment & Plan Note (Addendum)
With leukocytosis, procalcitonin negative.  Most likely reactive with significant epistaxis Patient also complaining of sinusitis. -Continue with Augmentin for concern of sinusitis and also keeping nasal packing until Monday

## 2022-11-07 NOTE — Assessment & Plan Note (Signed)
-  Resumed home amiodarone 200 mg daily, diltiazem 180 mg daily, spironolactone 25 mg daily

## 2022-11-07 NOTE — Assessment & Plan Note (Signed)
-  PPI

## 2022-11-07 NOTE — Assessment & Plan Note (Signed)
-  Simvastatin 20 mg nightly

## 2022-11-07 NOTE — Assessment & Plan Note (Addendum)
-  Nicotine patch ordered - Resumed home Chantix 1 mg p.o. twice daily - Patient is currently in the process of quitting, her last cigarette was on Friday, 11/04/2022

## 2022-11-07 NOTE — H&P (Addendum)
History and Physical   Theresa [name]:[id number] DOB: August 03, 1956 DOA: 11/07/2022  PCP: Juluis Pitch, MD  Patient coming from: home via EMS  I have personally briefly reviewed patient's old medical records in [place].  Chief Concern: Shortness of breath, nosebleed  HPI: [name] is a 67 year old female with history of GERD, obesity, sleep apnea, depression, anxiety, paroxysmal atrial flutter, on anticoagulation with Xarelto which is being held due to epistasis, recent hospitalization for acute blood loss anemia, who presents emergency department for chief concerns of repeat nosebleed.  Initial vitals in the emergency department showed temperature of 98.4, respiration rate of 18, heart rate 95, blood pressure 133/66, SpO2 of 90% on 2 L nasal cannula.  Serum sodium is 143, potassium 3.4, chloride 103, bicarb 30, BUN of 13, serum creatinine of 0.84, EGFR greater than 60, nonfasting blood glucose 98, WBC 11.3, hemoglobin 8.7, platelets of 244.  High sensitive troponin was 7 and on repeat is 7. COVID/influenza A/influenza B/RSV by PCR were negative.  ED treatment: Acetaminophen 1 g p.o. one-time dose, DuoNebs, Solu-Medrol 125 mg IV one-time dose, Afrin spray. --------------------------- At bedside, she is able to tell me her name, age, current location, current year.   She reports the chief concern is the left sided nose bleeding and she tried ice to her nose and was not able to stop the bleeding.  She endorses some difficulty swallowing and coughing up blood clots since the nose bleed started. She is still holding the xarelto.   She endorses dysuria. She denies chest pain, diarrhea, hematuria. Her ENT appointment is 11/14/22 and a second ENT appointment is 11/30/22.   Social history: She lives at home with her husband, for 12 years. She quit smoking since September and has been on/off cigarettes since. She denies etoh and recreational drug use. She is retired and  formerly worked in [employer].  ROS: Constitutional: no weight change, no fever ENT/Mouth: no sore throat, no rhinorrhea, + left nose bleeding Eyes: no eye pain, no vision changes Cardiovascular: no chest pain, + dyspnea,  no edema, no palpitations Respiratory: + cough, no sputum, + wheezing Gastrointestinal: no nausea, no vomiting, no diarrhea, no constipation Genitourinary: no urinary incontinence, + dysuria, no hematuria Musculoskeletal: no arthralgias, no myalgias Skin: no skin lesions, no pruritus Neuro: no weakness, no loss of consciousness, no syncope Psych: no anxiety, no depression, + decrease appetite Heme/Lymph: no bruising, no bleeding  ED Course: Discussed with emergency medicine provider, patient requiring hospitalization for chief concerns of COPD exacerbation.  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   Leukocytosis   Hyperlipidemia, unspecified   Coronary artery disease without angina pectoris   GERD (gastroesophageal reflux disease)   Symptomatic anemia   Assessment and Plan:  * COPD with acute exacerbation ([place]) With leukocytosis - Etiology workup in progress, check procalcitonin for possible aspiration - DuoNebs 3 times daily ordered - Solu-Medrol 40 mg IV twice daily, starting on 11/08/2022, 2 doses ordered - Admit to telemetry medical, observation  Epistaxis - Continue to monitor - Packing in place in the right nares and negative for bleeding in the left nares - Continue outpatient follow-up with  ENT - Repeat CBC in the a.m.  Hypertension - Resumed home amiodarone 200 mg daily, diltiazem 180 mg daily, spironolactone 25 mg  daily  Pulmonary hypertension (HCC) - BiPAP nightly ordered  Severe tobacco use disorder - Nicotine patch ordered - Resumed home Chantix 1 mg p.o. twice daily - Patient is currently in the process of quitting, her last cigarette was on Friday, 11/04/2022  Paroxysmal atrial flutter (HCC) - Continue to hold home Xarelto due to persistent epistasis until follow-up with ENT and/or cardiology outpatient  Sleep apnea With pulmonary hypertension - BiPAP nightly ordered  Obesity (BMI 30-39.9) - This meets criteria for morbid obesity based on the presence of 1 or more chronic comorbidities. Patient has hypertension. This complicates overall care and prognosis.   Symptomatic anemia - CBC in the a.m.  GERD (gastroesophageal reflux disease) - PPI  Hyperlipidemia, unspecified - Simvastatin 20 mg nightly  Leukocytosis - Etiology workup in progress at this time, differentials include aspiration pneumonia, UTI, reactive secondary to acute blood loss anemia - Check procalcitonin, UA - Repeat CBC in a.m.  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 twice daily - Norco 5-325 mg, 1 tablet every 8 hours as needed for severe pain resumed - Morphine 2 mg IV every 4 hours as needed for severe pain, 4 doses ordered  Chart reviewed.   DVT prophylaxis: TED hose Code Status: Full code Diet: Heart healthy Family Communication: spouse at bedside with patient's permission Disposition Plan: Pending clinical course Consults called: None at this time Admission status: Telemetry medical, observation  Past Medical History:  Diagnosis Date   Acute postoperative pain 10/03/2017   Anxiety    BiPAP (biphasic positive airway pressure) dependence    at hs   Carpal tunnel syndrome    CHF (congestive heart failure) ([place])    1/18   CHF (congestive heart failure) (HCC)    Chronic generalized abdominal pain    Chronic rhinitis    COPD (chronic  obstructive pulmonary disease) (HCC)    DDD (degenerative disc disease), cervical    DDD (degenerative disc disease), lumbosacral    Depression    Dyspnea    Edema    Flu    1/18   Gastritis    GERD (gastroesophageal reflux disease)    Hematuria    Hernia of abdominal wall 07/17/2018   Hernia, abdominal    Hypertension    Kidney stones    Low back pain    Lumbar radiculitis    Malodorous urine    Muscle weakness    Obesity    Oxygen dependent    Pneumonia due to COVID-19 virus 02/03/2020   Renal cyst    Sensory urge incontinence    Thyroid activity decreased    9/19   Tobacco abuse    Wheezing    Past Surgical History:  Procedure Laterality Date   ABDOMINAL HYSTERECTOMY     CARDIOVERSION N/A 04/29/2022   Procedure: CARDIOVERSION;  Surgeon: Corey Skains, MD;  Location: [facility];  Service: Cardiovascular;  Laterality: N/A;   CARPAL TUNNEL RELEASE Left 2012   cataract surgery Bilateral    EPIGASTRIC HERNIA REPAIR N/A 08/13/2018   HERNIA REPAIR EPIGASTRIC ADULT; polypropylene mesh reinforcement.  Surgeon: Robert Bellow, MD;  Location: [facility] OR[facility];  Service: General;  Laterality: N/A;   ESOPHAGOGASTRODUODENOSCOPY N/A 06/21/2022   Procedure: ESOPHAGOGASTRODUODENOSCOPY (EGD);  Surgeon: Toledo, Benay Pike, MD;  Location: [facility];  Service: Gastroenterology;  Laterality: N/A;  RIGHT/LEFT HEART CATH AND CORONARY ANGIOGRAPHY N/A 07/11/2019   Procedure: RIGHT/LEFT HEART CATH AND CORONARY ANGIOGRAPHY;  Surgeon: Yolonda Kida, MD;  Location: [facility];  Service: Cardiovascular;  Laterality: N/A;   TEE WITHOUT CARDIOVERSION N/A 04/29/2022   Procedure: TRANSESOPHAGEAL ECHOCARDIOGRAM (TEE);  Surgeon: Corey Skains, MD;  Location: [facility];  Service: Cardiovascular;  Laterality: N/A;   TONSILLECTOMY     TUBAL LIGATION     UMBILICAL HERNIA REPAIR N/A 08/13/2018   HERNIA REPAIR UMBILICAL ADULT; polypropylene mesh reinforcement Surgeon: Robert Bellow,  MD;  Location: [facility];  Service: General;  Laterality: N/A;   Social History:  reports that she quit smoking about 5 months ago. Her smoking use included cigarettes. She has a 66.00 pack-year smoking history. She has never used smokeless tobacco. She reports current drug use. She reports that she does not drink alcohol.  Allergies  Allergen Reactions   Gabapentin Other (See Comments)    Higher doses of 800 mg, 600 mg, 300 mg causes excessive sedation and contributes to numerous mechanical falls. (Unsure if 100 mg dose can be tolerated)   Codeine Nausea And Vomiting   Meloxicam Other (See Comments)    Stomach pain   Family History  Problem Relation Age of Onset   Heart disease Mother    Stroke Mother    Coronary artery disease Mother    Alcohol abuse Father    Heart disease Father    Lung cancer Sister    Cancer Sister    Breast cancer Sister    Cancer Brother    Cancer Brother    Kidney cancer Brother    Pneumonia Brother    Prostate cancer Neg Hx    Bladder Cancer Neg Hx    Family history: Family history reviewed and not pertinent.  Prior to Admission medications   Medication Sig Start Date End Date Taking? Authorizing Provider  acetaminophen (TYLENOL) 500 MG tablet Take 1,000 mg by mouth every 8 (eight) hours as needed for mild pain or headache.   Yes [provider]  albuterol (VENTOLIN HFA) 108 (90 Base) MCG/ACT inhaler Inhale 2 puffs into the lungs every 6 (six) hours as needed for wheezing or shortness of breath. 10/23/19  Yes Veronese, [place], MD  amiodarone (PACERONE) 200 MG tablet Take 1 tablet (200 mg total) by mouth daily. 04/30/22  Yes Sharen Hones, MD  busPIRone (BUSPAR) 5 MG tablet TAKE 1 TABLET BY MOUTH TWICE DAILY 09/03/22  Yes Birdie Sons, MD  butalbital-acetaminophen-caffeine (FIORICET) [phone] MG tablet Take 1 tablet by mouth every 6 (six) hours as needed for headache. LIMIT USE TO PREVENT MEDICATION OVERUSE HEADACHE 04/09/22  Yes Emeterio Reeve, DO  Cholecalciferol 50 MCG (2000 UT) CAPS Take 2,000 Units by mouth daily.    Yes [provider]  dapagliflozin propanediol (FARXIGA) 10 MG TABS tablet Take 1 tablet (10 mg total) by mouth daily before breakfast. 08/31/22  Yes Darylene Price A, FNP  diclofenac Sodium (VOLTAREN) 1 % GEL Apply 2 g topically 4 (four) times daily as needed (lower back and/or shoulder pain).   Yes [provider]  diltiazem (CARDIZEM CD) 180 MG 24 hr capsule Take 180 mg by mouth daily.   Yes [provider]  ferrous sulfate 325 (65 FE) MG EC tablet Take 325 mg by mouth daily.   Yes [provider]  Fluticasone-Umeclidin-Vilant 100-62.5-25 MCG/INH AEPB Inhale 1 puff into the lungs daily.   Yes [provider]  FUROSCIX 80 MG/10ML  CTKT Inject into the skin. 09/02/22  Yes [provider]  HYDROcodone-acetaminophen (NORCO/VICODIN) 5-325 MG tablet Take 1 tablet by mouth every 8 (eight) hours as needed for severe pain. Must last 30 days 10/31/22 11/30/22 Yes Milinda Pointer, MD  ipratropium-albuterol (DUONEB) 0.5-2.5 (3) MG/3ML SOLN USE 1 VIAL VIA NEBULIZER EVERY 6 HOURS AS NEEDED FOR SHORTNESS OF BREATH OR WHEEZING 11/30/20  Yes Chrismon, Vickki Muff, PA-C  melatonin 5 MG TABS Take 5 mg by mouth at bedtime.   Yes [provider]  naloxone (NARCAN) nasal spray 4 mg/0.1 mL Place 1 spray into the nose as needed for up to 365 doses (for opioid-induced respiratory depresssion). In case of emergency (overdose), spray once into each nostril. If no response within 3 minutes, repeat application and call [phone]. 08/03/22 08/03/23 Yes Milinda Pointer, MD  omeprazole (PRILOSEC) 40 MG capsule Take 40 mg by mouth daily. 11/01/22 11/01/23 Yes [provider]  polyethylene glycol (MIRALAX / GLYCOLAX) 17 g packet Take 17 g by mouth daily.   Yes [provider]  potassium chloride SA (KLOR-CON M) 20 MEQ tablet Take 2 tablets every morning and 1 tablet every  evening 06/22/22  Yes Hackney, Tina A, FNP  roflumilast (DALIRESP) 500 MCG TABS tablet Take 500 mcg by mouth daily.   Yes [provider]  sertraline (ZOLOFT) 100 MG tablet TAKE 2 TABLETS BY MOUTH ONCE EVERY MORNING 10/03/22  Yes Birdie Sons, MD  simvastatin (ZOCOR) 20 MG tablet Take 20 mg by mouth at bedtime.   Yes [provider]  spironolactone (ALDACTONE) 25 MG tablet Take 25 mg by mouth daily.   Yes [provider]  torsemide (DEMADEX) 20 MG tablet Take 40-80 mg by mouth See admin instructions. Take 4 tablets (73m) by mouth every morning and take 2 tablets (469m by mouth every afternoon   Yes [provider]  varenicline (CHANTIX) 1 MG tablet Take 1 mg by mouth 2 (two) times daily.   Yes [provider]  cephALEXin (KEFLEX) 500 MG capsule Take 1 capsule (500 mg total) by mouth 4 (four) times daily for 7 days. 11/06/22 11/13/22  ShMax SaneMD  HYDROcodone-acetaminophen (NORCO/VICODIN) 5-325 MG tablet Take 1 tablet by mouth every 8 (eight) hours as needed for severe pain. Must last 30 days 11/30/22 12/30/22  NaMilinda PointerMD  HYDROcodone-acetaminophen (NORCO/VICODIN) 5-325 MG tablet Take 1 tablet by mouth every 8 (eight) hours as needed for severe pain. Must last 30 days 12/30/22 01/29/23  NaMilinda PointerMD   Physical Exam: Vitals:   11/07/22 1030 11/07/22 1125 11/07/22 1130 11/07/22 1420  BP:  132/66 (!) 142/70 134/68  Pulse: 95  76 91  Resp:   18 18  Temp:    98.4 F (36.9 C)  SpO2: 90% 97% 94% 97%   Constitutional: appears age-appropriate, NAD, calm, comfortable Eyes: PERRL, lids and conjunctivae normal ENMT: Mucous membranes are moist. Posterior pharynx clear of any exudate or lesions. Age-appropriate dentition. Hearing appropriate.  Nasal packing in place in the right nares. No blood visible from the left nares. Neck: normal, supple, no masses, no thyromegaly Respiratory: Diffuse end expiratory wheezing on auscultation. Normal  respiratory effort. No accessory muscle use.  4 L nasal cannula in place Cardiovascular: Regular rate and rhythm, no murmurs / rubs / gallops. No extremity edema. 2+ pedal pulses. No carotid bruits.  Abdomen: obese abdomen, no tenderness, no masses palpated, no hepatosplenomegaly. Bowel sounds positive.  Musculoskeletal: no clubbing / cyanosis. No joint deformity upper and lower  extremities. Good ROM, no contractures, no atrophy. Normal muscle tone.  Skin: no rashes, lesions, ulcers. No induration Neurologic: Sensation intact. Strength 5/5 in all 4.  Psychiatric: Normal judgment and insight. Alert and oriented x 3. Normal mood.   EKG: Not indicated at this time Chest x-ray on Admission: I personally reviewed and I agree with radiologist reading as below.  CT Head Wo Contrast  Result Date: 11/07/2022 CLINICAL DATA:  Headache, increasing frequency or severity EXAM: CT HEAD WITHOUT CONTRAST TECHNIQUE: Contiguous axial images were obtained from the base of the skull through the vertex without intravenous contrast. RADIATION DOSE REDUCTION: This exam was performed according to the departmental dose-optimization program which includes automated exposure control, adjustment of the mA and/or kV according to patient size and/or use of iterative reconstruction technique. COMPARISON:  04/08/2022 FINDINGS: Brain: No evidence of acute infarction, hemorrhage, hydrocephalus, extra-axial collection or mass lesion/mass effect. Scattered low-density changes within the periventricular and subcortical white matter compatible with chronic microvascular ischemic change. Vascular: Atherosclerotic calcifications involving the large vessels of the skull base. No unexpected hyperdense vessel. Skull: Normal. Negative for fracture or focal lesion. Sinuses/Orbits: Findings of sinusitis involving the right maxillary sinus and to a lesser degree the right sphenoid, ethmoid, and frontal sinuses. Right-sided nasal mucosal thickening.  Mastoid air cells are clear. Other: None. IMPRESSION: 1. No acute intracranial findings. 2. Findings of sinusitis involving the right maxillary sinus and to a lesser degree the right sphenoid, ethmoid, and frontal sinuses. 3. Chronic microvascular ischemic change. Electronically Signed   By: Davina Poke D.O.   On: 11/07/2022 13:54   DG Chest 2 View  Result Date: 11/07/2022 CLINICAL DATA:  SOB chronic cough EXAM: CHEST - 2 VIEW COMPARISON:  11/05/2022 FINDINGS: The heart size and mediastinal contours are within normal limits. Lungs are hyperinflated suggesting COPD. There is no focal consolidation. No pneumothorax or pleural effusion. Aorta is calcified. Osseous structures are osteopenic. There are thoracic degenerative changes. IMPRESSION: Findings suggest COPD.  Otherwise no active cardiopulmonary disease. Electronically Signed   By: Sammie Bench M.D.   On: 11/07/2022 12:16    Labs on Admission: I have personally reviewed following labs  CBC: Recent Labs  Lab 11/05/22 1359 11/06/22 0444 11/07/22 1137  WBC 10.4 8.4 11.3*  NEUTROABS 8.9*  --  9.2*  HGB 7.5* 7.9* 8.7*  HCT 28.4* 28.5* 32.1*  MCV 97.6 96.3 100.3*  PLT 239 249 832   Basic Metabolic Panel: Recent Labs  Lab 11/05/22 1359 11/06/22 0444 11/07/22 1137  NA 138 137 143  K 3.7 3.7 3.4*  CL 103 103 103  CO2 _0 GLUCOSE 104* 141* 98  BUN _1 CREATININE 0.66 0.71 0.84  CALCIUM 8.8* 8.1* 8.9   GFR: Estimated Creatinine Clearance: 70.9 mL/min (by C-G formula based on SCr of 0.84 mg/dL).  Liver Function Tests: Recent Labs  Lab 11/05/22 1359  AST 23  ALT 18  ALKPHOS 82  BILITOT 0.4  PROT 6.7  ALBUMIN 3.5   Urine analysis:    Component Value Date/Time   COLORURINE STRAW (A) 06/19/2022 1248   APPEARANCEUR CLEAR (A) 06/19/2022 1248   APPEARANCEUR Cloudy (A) 11/28/2016 1128   LABSPEC 1.003 (L) 06/19/2022 1248   LABSPEC 1.011 06/12/2014 2003   PHURINE 7.0 06/19/2022 1248   GLUCOSEU >=500 (A)  06/19/2022 1248   GLUCOSEU Negative 06/12/2014 2003   HGBUR NEGATIVE 06/19/2022 1248   BILIRUBINUR NEGATIVE 06/19/2022 1248   BILIRUBINUR Negative 10/30/2019 1516   BILIRUBINUR Negative  11/28/2016 1128   BILIRUBINUR Negative 06/12/2014 2003   KETONESUR NEGATIVE 06/19/2022 1248   PROTEINUR NEGATIVE 06/19/2022 1248   UROBILINOGEN 0.2 10/30/2019 1516   UROBILINOGEN 0.2 07/30/2013 1305   NITRITE NEGATIVE 06/19/2022 [place] 06/19/2022 1248   LEUKOCYTESUR Negative 06/12/2014 2003   This document was prepared using Dragon Voice Recognition software and may include unintentional dictation errors.  Dr. Tobie Poet [facility]  If 7PM-7AM, please contact overnight-coverage provider If 7AM-7PM, please contact day coverage provider [email or web address]  11/07/2022, 5:55 PM

## 2022-11-07 NOTE — ED Notes (Signed)
Pt endorsing she didn't receive meal tray, dietary called. Meal tray to be delivered to pt. Pt denies further needs.

## 2022-11-07 NOTE — Assessment & Plan Note (Addendum)
Most likely reactive, also concern of sinusitis.  Procalcitonin and UA negative Improving. -Management as above

## 2022-11-07 NOTE — Assessment & Plan Note (Addendum)
-  Resumed home buspirone 500 mg p.o. twice daily - Norco 5-325 mg, 1 tablet every 8 hours as needed for severe pain resumed - Morphine 2 mg IV every 4 hours as needed for severe pain, 4 doses ordered

## 2022-11-07 NOTE — Hospital Course (Addendum)
Theresa [name] is a 67 year old female with history of GERD, obesity, sleep apnea, depression, anxiety, paroxysmal atrial flutter, on anticoagulation with Xarelto which is being held due to epistasis, recent hospitalization for acute blood loss anemia, who presents emergency department for chief concerns of repeat nosebleed.  Initial vitals in the emergency department showed temperature of 98.4, respiration rate of 18, heart rate 95, blood pressure 133/66, SpO2 of 90% on 2 L nasal cannula.  Serum sodium is 143, potassium 3.4, chloride 103, bicarb 30, BUN of 13, serum creatinine of 0.84, EGFR greater than 60, nonfasting blood glucose 98, WBC 11.3, hemoglobin 8.7, platelets of 244.  High sensitive troponin was 7 and on repeat is 7. COVID/influenza A/influenza B/RSV by PCR were negative.  ED treatment: Acetaminophen 1 g p.o. one-time dose, DuoNebs, Solu-Medrol 125 mg IV one-time dose, Afrin spray.  1/23: Hemodynamically stable.  No more active epistaxis, right nose with packing.  Patient got very upset when she was told that she can go home and follow-up with ENT as an outpatient, stating that she has sinus congestion and will come back to ED if discharge. Keeping her for another night for her satisfaction.  No medical reason to remain in the hospital. Will continue Augmentin for 5 days for sinusitis and packing will remain in until next Monday.  1/24: Patient remained hemodynamically stable.  Remained on baseline oxygen requirement.  Hemoglobin increased to 8.5.  Today she was concern of increasing white cell count and does not want to go.  She was also having mild pleuritic chest pain and a repeat chest x-ray was without any acute abnormality.  Tried explaining to her that she is on steroids which can make her white cell count to go up.  She is also on antibiotics for sinusitis.  Then she started talking about possible UTI,  UA was not consistent with UTI, no more bleeding.  Is stable for  discharge and need to have a follow-up with her providers and ENT as outpatient for removal of nasal packing. She was given 4 more days of antibiotic along with some prednisone for concern of COPD exacerbation on admission.  She will continue with rest of her home medications and need to have a close follow-up with her providers for further recommendations.

## 2022-11-07 NOTE — Assessment & Plan Note (Addendum)
-  Continue to monitor - Packing in place in the right nares and negative for bleeding in the left nares - Continue outpatient follow-up with ENT - Repeat CBC in the a.m.

## 2022-11-07 NOTE — ED Triage Notes (Addendum)
First nurse note: Pt to ED via [facility] from home. Pt reports nosebleed that started this morning around 9am from right nostril. Pt seen on 1/20 for nosebleed in left nostril that required 1 unit of blood due to low HGB and unable to control bleeding. Pt has been off xarelto since Friday. Pt wears 2L [place] chronically for COPD, [place] currently in mouth due to unable tolerate in nose. Pt given albuterol tx PTA due to wheezing.

## 2022-11-07 NOTE — Assessment & Plan Note (Addendum)
-  Continue amiodarone - Continue to hold home Xarelto due to persistent epistasis until follow-up with ENT and/or cardiology outpatient

## 2022-11-07 NOTE — Assessment & Plan Note (Signed)
-  CBC in the a.m.

## 2022-11-07 NOTE — Assessment & Plan Note (Addendum)
-  This meets criteria for morbid obesity based on the presence of 1 or more chronic comorbidities. Patient has hypertension. This complicates overall care and prognosis.

## 2022-11-07 NOTE — ED Provider Notes (Signed)
[facility] Provider Note    Event Date/Time   First MD Initiated Contact with Patient 11/07/22 1023     (approximate)   History   Epistaxis   HPI  Theresa [name] is a 67 y.o. female with past medical history significant for COPD on baseline 4 L of home oxygen, atrial fibrillation on Xarelto, who presents to the emergency department for epistaxis and shortness of breath.  Patient endorses epistaxis that started this morning after she was discharged from the hospital yesterday for an episode of epistaxis that required packing to the right nare.  Patient was on Xarelto but has been holding [place] since Friday.  Did receive 1 unit of PRBC during that hospitalization.  States that she was fine at time of discharge however this morning had significant bleeding from the left nare.  Felt like it was going down her throat.  Now with increased work of breathing and shortness of breath.  Denies any chest pain.  Also complaining of a chronic headache that has been ongoing and significant tearing of her eyes.  Denies any falls or trauma.     Physical Exam   Triage Vital Signs: ED Triage Vitals [11/07/22 1026]  Enc Vitals Group     BP 133/66     Pulse Rate 87     Resp 18     Temp      Temp src      SpO2 (!) 89 %     Weight      Height      Head Circumference      Peak Flow      Pain Score 0     Pain Loc      Pain Edu?      Excl. in [place]?     Most recent vital signs: Vitals:   11/07/22 1130 11/07/22 1420  BP: (!) 142/70 134/68  Pulse: 76 91  Resp: 18 18  Temp:  98.4 F (36.9 C)  SpO2: 94% 97%    Physical Exam Constitutional:      Appearance: She is well-developed.  HENT:     Head: Atraumatic.     Nose:     Comments: Rhino Rocket to the right nare that appears hemostatic, mild oozing of blood to the left nare after removing blood clots no signs of bleeding to the anterior nare on the left.  Packing left in place on the right nare Eyes:      Conjunctiva/sclera: Conjunctivae normal.     Comments: Watering bilaterally  Cardiovascular:     Rate and Rhythm: Regular rhythm.  Pulmonary:     Effort: Respiratory distress present.     Breath sounds: Wheezing present.     Comments: Speaking in 2 word sentences Abdominal:     General: There is no distension.  Musculoskeletal:        General: Normal range of motion.     Cervical back: Normal range of motion.  Skin:    General: Skin is warm.  Neurological:     Mental Status: She is alert. Mental status is at baseline.      IMPRESSION / MDM / ASSESSMENT AND PLAN / ED COURSE  I reviewed the triage vital signs and the nursing notes.  Differential diagnosis including posterior nosebleed, anterior nosebleed, COPD exacerbation, pneumonia  Patient was given aspirin and held pressure for 15 minutes.  Hemostatic.  No signs of a posterior nosebleed   RADIOLOGY I independently reviewed imaging, my interpretation of  imaging: Chest x-ray no focal findings consistent with pneumonia   Labs (all labs ordered are listed, but only abnormal results are displayed) Labs interpreted as -  COVID and influenza testing obtained given cough and shortness of breath symptoms given concern for viral illness, they were negative.  No significant hypercarbia on VBG, appears compensated.  Anemia but hemoglobin is stable.  Labs Reviewed  CBC WITH DIFFERENTIAL/PLATELET - Abnormal; Notable for the following components:      Result Value   WBC 11.3 (*)    RBC 3.20 (*)    Hemoglobin 8.7 (*)    HCT 32.1 (*)    MCV 100.3 (*)    MCHC 27.1 (*)    RDW 24.0 (*)    Neutro Abs 9.2 (*)    Abs Immature Granulocytes 0.13 (*)    All other components within normal limits  BASIC METABOLIC PANEL - Abnormal; Notable for the following components:   Potassium 3.4 (*)    All other components within normal limits  BLOOD GAS, VENOUS - Abnormal; Notable for the following components:   pCO2, Ven 66 (*)    pO2, Ven <31  (*)    Bicarbonate 38.2 (*)    Acid-Base Excess 10.2 (*)    All other components within normal limits  RESP PANEL BY RT-PCR (RSV, FLU A&B, COVID)  [id number]  PROCALCITONIN  URINALYSIS, COMPLETE (UACMP) WITH MICROSCOPIC  BASIC METABOLIC PANEL  CBC  MAGNESIUM  TYPE AND SCREEN  TROPONIN I (HIGH SENSITIVITY)  TROPONIN I (HIGH SENSITIVITY)    Discussed with ENT.  Dr. Kathyrn Sheriff, recommended close follow-up as an outpatient with ENT.  Given that the patient's bleeding has stopped at this time did not recommend any further intervention.  Made recommendations of resting, humidifier and following up with ENT with Dr. Tami Ribas as an outpatient or if her bleeding returns or to the emergency department if her bleeding returns.  On reevaluation patient continues to have increased work of breathing.  Patient received DuoNeb treatment and IV Solu-Medrol given concern for COPD exacerbation  On reevaluation continues to have increased work of breathing.  Patient was given antibiotics to cover for COPD exacerbation and for acute sinusitis given her chronic nature of her headache and appear symptomatic.  Consulted hospitalist for admission.     PROCEDURES:  Critical Care performed: No  Procedures  Patient's presentation is most consistent with acute presentation with potential threat to life or bodily function.   MEDICATIONS ORDERED IN ED: Medications  acetaminophen (TYLENOL) tablet 650 mg (has no administration in time range)    Or  acetaminophen (TYLENOL) suppository 650 mg (has no administration in time range)  ondansetron (ZOFRAN) tablet 4 mg (has no administration in time range)    Or  ondansetron (ZOFRAN) injection 4 mg (has no administration in time range)  senna-docusate (Senokot-S) tablet 1 tablet (has no administration in time range)  ipratropium-albuterol (DUONEB) 0.5-2.5 (3) MG/3ML nebulizer solution 3 mL (has no administration in time range)  methylPREDNISolone sodium succinate  (SOLU-MEDROL) 40 mg/mL injection 40 mg (has no administration in time range)  nicotine (NICODERM CQ - dosed in mg/24 hours) patch 21 mg (has no administration in time range)  butalbital-acetaminophen-caffeine (FIORICET) 50-325-40 MG per tablet 1 tablet (has no administration in time range)  HYDROcodone-acetaminophen (NORCO/VICODIN) 5-325 MG per tablet 1 tablet (has no administration in time range)  amiodarone (PACERONE) tablet 200 mg (has no administration in time range)  diltiazem (CARDIZEM CD) 24 hr capsule 180 mg (has no administration in time  range)  simvastatin (ZOCOR) tablet 20 mg (has no administration in time range)  spironolactone (ALDACTONE) tablet 25 mg (has no administration in time range)  sertraline (ZOLOFT) tablet 100 mg (has no administration in time range)  melatonin tablet 5 mg (has no administration in time range)  ferrous sulfate tablet 325 mg (has no administration in time range)  pantoprazole (PROTONIX) EC tablet 80 mg (has no administration in time range)  busPIRone (BUSPAR) tablet 5 mg (has no administration in time range)  varenicline (CHANTIX) tablet 1 mg (has no administration in time range)  ipratropium-albuterol (DUONEB) 0.5-2.5 (3) MG/3ML nebulizer solution 9 mL (9 mLs Nebulization Given 11/07/22 1540)  oxymetazoline (AFRIN) 0.05 % nasal spray 1 spray (1 spray Each Nare Given by Other 11/07/22 1045)  acetaminophen (TYLENOL) tablet 1,000 mg (1,000 mg Oral Given 11/07/22 1211)  methylPREDNISolone sodium succinate (SOLU-MEDROL) 125 mg/2 mL injection 125 mg (125 mg Intravenous Given 11/07/22 1212)  morphine (PF) 4 MG/ML injection 4 mg (4 mg Intravenous Given 11/07/22 1547)  ondansetron (ZOFRAN) injection 4 mg (4 mg Intravenous Given 11/07/22 1546)  amoxicillin-clavulanate (AUGMENTIN) 875-125 MG per tablet 1 tablet (1 tablet Oral Given 11/07/22 1546)    FINAL CLINICAL IMPRESSION(S) / ED DIAGNOSES   Final diagnoses:  Epistaxis  COPD with acute exacerbation ([place])     Rx  / DC Orders   ED Discharge Orders     None        Note:  This document was prepared using Dragon voice recognition software and may include unintentional dictation errors.   Nathaniel Man, MD 11/07/22 1714

## 2022-11-07 NOTE — Assessment & Plan Note (Signed)
With pulmonary hypertension - BiPAP nightly ordered

## 2022-11-07 NOTE — Progress Notes (Signed)
PHARMACIST - PHYSICIAN COMMUNICATION  CONCERNING:  Simvastatin dose with amlodipine   RECOMMENDATION: Patient was prescribed simvastatin 20 mg qHS (home regimen)  Dose of simvastatin should not exceed 10 mg daily if coadministering with diltiazem due to increased risk of rhabdomyolysis   DESCRIPTION: Pharmacy has substituted equivalent dose of atorvastatin    Patient is now receiving atorvastatin 10 mg qHS    Dorothe Pea, PharmD, BCPS Clinical Pharmacist  11/07/2022 10:07 PM

## 2022-11-07 NOTE — Assessment & Plan Note (Signed)
-  BiPAP nightly ordered

## 2022-11-08 ENCOUNTER — Telehealth: Payer: Self-pay

## 2022-11-08 ENCOUNTER — Other Ambulatory Visit: Payer: Self-pay

## 2022-11-08 DIAGNOSIS — J441 Chronic obstructive pulmonary disease with (acute) exacerbation: Secondary | ICD-10-CM | POA: Diagnosis not present

## 2022-11-08 DIAGNOSIS — I272 Pulmonary hypertension, unspecified: Secondary | ICD-10-CM | POA: Diagnosis not present

## 2022-11-08 DIAGNOSIS — I4892 Unspecified atrial flutter: Secondary | ICD-10-CM

## 2022-11-08 DIAGNOSIS — D62 Acute posthemorrhagic anemia: Secondary | ICD-10-CM | POA: Diagnosis not present

## 2022-11-08 DIAGNOSIS — F17200 Nicotine dependence, unspecified, uncomplicated: Secondary | ICD-10-CM

## 2022-11-08 DIAGNOSIS — R040 Epistaxis: Secondary | ICD-10-CM | POA: Diagnosis not present

## 2022-11-08 DIAGNOSIS — D72829 Elevated white blood cell count, unspecified: Secondary | ICD-10-CM | POA: Diagnosis not present

## 2022-11-08 DIAGNOSIS — R69 Illness, unspecified: Secondary | ICD-10-CM | POA: Diagnosis not present

## 2022-11-08 DIAGNOSIS — I10 Essential (primary) hypertension: Secondary | ICD-10-CM

## 2022-11-08 LAB — BASIC METABOLIC PANEL
Anion gap: 6 (ref 5–15)
BUN: 12 mg/dL (ref 8–23)
CO2: 31 mmol/L (ref 22–32)
Calcium: 8.6 mg/dL — ABNORMAL LOW (ref 8.9–10.3)
Chloride: 102 mmol/L (ref 98–111)
Creatinine, Ser: 0.71 mg/dL (ref 0.44–1.00)
GFR, Estimated: >60 mL/min (ref >60)
Glucose, Bld: 126 mg/dL — ABNORMAL HIGH (ref 70–99)
Potassium: 3.9 mmol/L (ref 3.5–5.1)
Sodium: 139 mmol/L (ref 135–145)

## 2022-11-08 LAB — BLOOD GAS, VENOUS
Acid-Base Excess: 10.2 mmol/L — ABNORMAL HIGH (ref 0.0–2.0)
Bicarbonate: 38.2 mmol/L — ABNORMAL HIGH (ref 20.0–28.0)
O2 Saturation: 33.9 %
Patient temperature: 37
pCO2, Ven: 66 mmHg — ABNORMAL HIGH (ref 44–60)
pH, Ven: 7.37 (ref 7.25–7.43)
pO2, Ven: <31 mmHg — CL (ref 32–45)

## 2022-11-08 LAB — MAGNESIUM: Magnesium: 2.8 mg/dL — ABNORMAL HIGH (ref 1.7–2.4)

## 2022-11-08 LAB — CBC
HCT: 29 % — ABNORMAL LOW (ref 36.0–46.0)
Hemoglobin: 7.8 g/dL — ABNORMAL LOW (ref 12.0–15.0)
MCH: 26.9 pg (ref 26.0–34.0)
MCHC: 26.9 g/dL — ABNORMAL LOW (ref 30.0–36.0)
MCV: 100 fL (ref 80.0–100.0)
Platelets: 258 10*3/uL (ref 150–400)
RBC: 2.9 MIL/uL — ABNORMAL LOW (ref 3.87–5.11)
RDW: 23.7 % — ABNORMAL HIGH (ref 11.5–15.5)
WBC: 11.1 10*3/uL — ABNORMAL HIGH (ref 4.0–10.5)
nRBC: 0 % (ref 0.0–0.2)

## 2022-11-08 MED ORDER — STERILE WATER FOR INJECTION IJ SOLN
INTRAMUSCULAR | Status: AC
  Administered 2022-11-08: 10 mL
  Filled 2022-11-08: qty 10

## 2022-11-08 MED ORDER — PREDNISONE 20 MG PO TABS
40.0000 mg | ORAL_TABLET | Freq: Every day | ORAL | Status: DC
  Administered 2022-11-09: 40 mg via ORAL
  Filled 2022-11-08: qty 2

## 2022-11-08 MED ORDER — AMOXICILLIN-POT CLAVULANATE 875-125 MG PO TABS
1.0000 | ORAL_TABLET | Freq: Two times a day (BID) | ORAL | Status: DC
  Administered 2022-11-08 – 2022-11-09 (×3): 1 via ORAL
  Filled 2022-11-08 (×3): qty 1

## 2022-11-08 MED ORDER — FERROUS SULFATE 325 (65 FE) MG PO TABS
325.0000 mg | ORAL_TABLET | Freq: Two times a day (BID) | ORAL | Status: DC
  Administered 2022-11-08 – 2022-11-09 (×3): 325 mg via ORAL
  Filled 2022-11-08 (×3): qty 1

## 2022-11-08 NOTE — Progress Notes (Signed)
Bipap held first admission night due to c/o nose bleed. Per documentation patient on 4 liters o2. Will continue to monitor.

## 2022-11-08 NOTE — Progress Notes (Signed)
Progress Note   Patient: Theresa [name] DXI:[id number] DOB: 10-20-1955 DOA: 11/07/2022     0 DOS: the patient was seen and examined on 11/08/2022   Brief hospital course: Theresa [name] is a 67 year old female with history of GERD, obesity, sleep apnea, depression, anxiety, paroxysmal atrial flutter, on anticoagulation with Xarelto which is being held due to epistasis, recent hospitalization for acute blood loss anemia, who presents emergency department for chief concerns of repeat nosebleed.  Initial vitals in the emergency department showed temperature of 98.4, respiration rate of 18, heart rate 95, blood pressure 133/66, SpO2 of 90% on 2 L nasal cannula.  Serum sodium is 143, potassium 3.4, chloride 103, bicarb 30, BUN of 13, serum creatinine of 0.84, EGFR greater than 60, nonfasting blood glucose 98, WBC 11.3, hemoglobin 8.7, platelets of 244.  High sensitive troponin was 7 and on repeat is 7. COVID/influenza A/influenza B/RSV by PCR were negative.  ED treatment: Acetaminophen 1 g p.o. one-time dose, DuoNebs, Solu-Medrol 125 mg IV one-time dose, Afrin spray.  1/23: Hemodynamically stable.  No more active epistaxis, right nose with packing.  Patient got very upset when she was told that she can go home and follow-up with ENT as an outpatient, stating that she has sinus congestion and will come back to ED if discharge. Keeping her for another night for her satisfaction.  No medical reason to remain in the hospital. Will continue Augmentin for 5 days for sinusitis and packing will remain in until next Monday.  Assessment and Plan: * Epistaxis - Continue to monitor - Packing in place in the right nares and negative for bleeding in the left nares - Continue outpatient follow-up with ENT - Repeat CBC in the a.m.  ABLA (acute blood loss anemia) Hemoglobin decreased to 7.8 this morning, most likely secondary to significant epistaxis.  History of iron deficiency anemia -Increase the home  dose of iron supplement to twice daily -Monitor hemoglobin -Transfuse if below 7  COPD with acute exacerbation (HCC) With leukocytosis, procalcitonin negative.  Most likely reactive with significant epistaxis Patient also complaining of sinusitis. -Continue with Augmentin for concern of sinusitis and also keeping nasal packing until Monday  Paroxysmal atrial flutter ([place]) -Continue amiodarone - Continue to hold home Xarelto due to persistent epistasis until follow-up with ENT and/or cardiology outpatient  Hypertension - Resumed home amiodarone 200 mg daily, diltiazem 180 mg daily, spironolactone 25 mg daily  Pulmonary hypertension (HCC) - BiPAP nightly ordered  Severe tobacco use disorder - Nicotine patch ordered - Resumed home Chantix 1 mg p.o. twice daily - Patient is currently in the process of quitting, her last cigarette was on Friday, 11/04/2022  Leukocytosis Most likely reactive, also concern of sinusitis.  Procalcitonin and UA negative Improving. 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 twice daily - Norco 5-325 mg, 1 tablet every 8 hours as needed for severe pain resumed - Morphine 2 mg IV every 4 hours as needed for severe pain, 4 doses ordered  Hyperlipidemia, unspecified - Simvastatin 20 mg nightly  GERD (gastroesophageal reflux disease) - PPI  Obesity (BMI 30-39.9) - This meets criteria for morbid obesity based on the presence of 1 or more chronic comorbidities. Patient has hypertension. This complicates overall care and prognosis.   Symptomatic anemia - CBC in the a.m.   Subjective: Patient was seen and examined today.  She was complaining of  sinus congestion.  No more nosebleed. She was getting very upset and started shouting that she will not leave the hospital now and if discharge will be right back to  ED.  Physical Exam: Vitals:   11/08/22 0100 11/08/22 0244 11/08/22 0301 11/08/22 0750  BP: (!) 113/90 118/84 (!) 122/58 121/68  Pulse: 77 82 78 67  Resp: _0 Temp: 98.4 F (36.9 C) 98.5 F (36.9 C) 98.2 F (36.8 C) 97.7 F (36.5 C)  TempSrc: Oral Oral Oral Oral  SpO2: 96% 95% 94% 96%  Weight:   99 kg   Height:   _1  (1.575 m)    General.  Morbidly obese lady, in no acute distress.  Nasal packing in right nares Pulmonary.  Lungs clear bilaterally, normal respiratory effort. CV.  Regular rate and rhythm, no JVD, rub or murmur. Abdomen.  Soft, nontender, nondistended, BS positive. CNS.  Alert and oriented .  No focal neurologic deficit. Extremities.  No edema, no cyanosis, pulses intact and symmetrical. Psychiatry.  Judgment and insight appears normal.   Data Reviewed: Prior data reviewed  Family Communication: Called husband with no response  Disposition: Status is: Observation The patient remains OBS appropriate and will d/c before 2 midnights.  Planned Discharge Destination: Home   Time spent: 50 minutes  This record has been created using Systems analyst. Errors have been sought and corrected,but may not always be located. Such creation errors do not reflect on the standard of care.   Author: Lorella Nimrod, MD 11/08/2022 2:53 PM  For on call review [email or web address].

## 2022-11-08 NOTE — Assessment & Plan Note (Signed)
Hemoglobin decreased to 7.8 this morning, most likely secondary to significant epistaxis.  History of iron deficiency anemia -Increase the home dose of iron supplement to twice daily -Monitor hemoglobin -Transfuse if below 7

## 2022-11-08 NOTE — TOC Initial Note (Signed)
Transition of Care [facility]) - Initial/Assessment Note    Patient Details  Name: Theresa [name] MRN: [id number] Date of Birth: 1955/12/04  [facility] CM/SW Contact:    Beverly Sessions, RN Phone Number: 11/08/2022, 2:25 PM  Clinical Narrative:                 Admitted for: Nose bleed Admitted from:home with husband [id number] Current home health/prior home health/DME: rollator, 4L O2 through [facility]  Per patient husband will transport at discharge and will bring portable O2         Patient Goals and CMS Choice            Expected Discharge Plan and Services                                              Prior Living Arrangements/Services                       Activities of Daily Living Home Assistive Devices/Equipment: Other (Comment) (rollator) ADL Screening (condition at time of admission) Patient's cognitive ability adequate to safely complete daily activities?: Yes Is the patient deaf or have difficulty hearing?: No Does the patient have difficulty seeing, even when wearing glasses/contacts?: No Does the patient have difficulty concentrating, remembering, or making decisions?: No Patient able to express need for assistance with ADLs?: Yes Does the patient have difficulty dressing or bathing?: Yes Independently performs ADLs?: Yes (appropriate for developmental age) Does the patient have difficulty walking or climbing stairs?: Yes Weakness of Legs: Both Weakness of Arms/Hands: None  Permission Sought/Granted                  Emotional Assessment              Admission diagnosis:  Epistaxis [R04.0] COPD exacerbation ([place]) [J44.1] COPD with acute exacerbation ([place]) [J44.1] Patient Active Problem List   Diagnosis Date Noted   COPD exacerbation ([place]) 11/07/2022   ABLA (acute blood loss anemia) 11/05/2022   Epistaxis 11/05/2022   Iron deficiency anemia due to chronic blood loss 10/06/2022   Chronic bacterial  conjunctivitis of both eyes 09/02/2022   Symptomatic anemia 08/28/2022   H/O: upper GI bleed 08/26/2022   Shortness of breath 08/09/2022   Atrial fibrillation with rapid ventricular response ([place]) 07/19/2022   Duodenal ulcer 06/27/2022   Coronary artery disease without angina pectoris 06/26/2022   GERD (gastroesophageal reflux disease) 06/26/2022   Severe tricuspid regurgitation 06/26/2022   Paroxysmal atrial flutter ([place]) 04/26/2022   Hyperlipidemia, unspecified 04/25/2022   Abnormal MRI, cervical spine 04/11/2022   Obesity (BMI 30-39.9) 04/02/2022   Pulmonary hypertension ([place]) 04/01/2022   Depression with anxiety    COPD with acute exacerbation ([place]) 08/06/2021   Leukocytosis 08/06/2021   Chronic shoulder pain (Bilateral) 05/05/2021   Chronic use of opiate for therapeutic purpose 01/26/2021   Pharmacologic therapy 04/08/2020   Lumbar disc extrusion (L2-3) (Left) 09/10/2019   Chronic knee pain (Left) 08/14/2019   Chronic left-sided lumbar radiculopathy 08/14/2019   Abnormal MRI, lumbar spine (07/23/2019) 08/14/2019   Cervicalgia (Bilateral) (R>L) 09/24/2018   Spondylosis without myelopathy or radiculopathy, lumbosacral region 09/24/2018   Chronic lower extremity pain (Bilateral) (R>L) 01/25/2018   Chronic hip pain (3ry area of Pain) (Bilateral) (L>R) 11/27/2017   Chronic anticoagulation (XARELTO) 08/08/2017   Arthralgia of acromioclavicular joint (Right) 06/15/2017  Acromioclavicular joint DJD (Right) 06/15/2017   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   Chronic shoulder pain (Left) 05/11/2017   Lumbar facet hypertrophy (multilevel) 11/17/2016   Lumbar spinal stenosis (L4-5) 11/17/2016   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) 11/17/2016   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) 11/17/2016   Lumbosacral radiculopathy at L4 11/17/2016   Sleep apnea 06/02/2016   Lumbar facet syndrome (Bilateral) (R>L) 01/12/2016   Chronic sacroiliac joint pain  (Bilateral) 01/12/2016   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) 12/28/2015   Acute on chronic respiratory failure with hypoxia and hypercapnia (HCC) 11/03/2015   Coagulation disorder ([place]) 10/22/2015   Chronic pain syndrome (significant psychosocial component) 09/21/2015   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 09/21/2015   Long term current use of opiate analgesic 09/16/2015   Long term prescription opiate use 09/16/2015   Encounter for therapeutic drug level monitoring 09/16/2015   Encounter for chronic pain management 09/16/2015   Neurogenic pain 09/16/2015   Neuropathic pain 09/16/2015   Musculoskeletal pain 09/16/2015   Lumbar spondylosis (L4-5) 09/16/2015   Chronic low back pain (1ry area of Pain) (Bilateral) (R>L) w/o sciatica 09/16/2015   Chronic lower extremity pain (2ry area of Pain) (Left) 09/16/2015   Chronic lumbar radicular pain (L4 & S1 Dermatome) (Left) 09/16/2015   Chronic shoulder pain (Right side) 09/16/2015   Chronic upper extremity pain (Right-sided) 09/16/2015   Cervical radiculitis (Right side) 09/16/2015   Abnormal x-ray of lumbar spine 09/16/2015   Chronic diastolic heart failure ([place]) 07/15/2015   Severe tobacco use disorder 07/15/2015   Chronic respiratory failure with hypoxia ([place]) 04/28/2015   Carpal tunnel syndrome 07/16/2014   Anxiety state 07/16/2014   Neuritis or radiculitis due to rupture of lumbar intervertebral disc 07/02/2014   DDD (degenerative disc disease), lumbar 07/02/2014   COPD (chronic obstructive pulmonary disease) ([place]) 02/27/2014   Hypertension 03/27/2008   PCP:  Juluis Pitch, MD Pharmacy:   [facility], [address] [place] [address] Phone: [phone] Fax: [phone]     Social Determinants of Health (SDOH) Social History: SDOH Screenings   Food Insecurity: No Food Insecurity (11/08/2022)  Housing: Low Risk  (11/08/2022)  Transportation Needs: No  Transportation Needs (11/08/2022)  Utilities: Not At Risk (11/08/2022)  Alcohol Screen: Low Risk  (09/02/2022)  Depression (PHQ2-9): High Risk (09/02/2022)  Financial Resource Strain: Low Risk  (09/20/2021)  Physical Activity: Inactive (09/20/2021)  Social Connections: Moderately Integrated (09/20/2021)  Stress: No Stress Concern Present (09/20/2021)  Tobacco Use: Medium Risk (11/07/2022)   SDOH Interventions:     Readmission Risk Interventions    06/21/2022   12:54 PM 08/09/2021   10:43 AM  Readmission Risk Prevention Plan  Transportation Screening Complete Complete  PCP or Specialist Appt within 3-5 Days  Complete  HRI or [place]  Complete  Social Work Consult for [place] Planning/Counseling  Complete  Palliative Care Screening  Not Applicable  Medication Review Press photographer) Complete Complete  PCP or Specialist appointment within 3-5 days of discharge Complete   SW Recovery Care/Counseling Consult Complete   [place] Not Applicable

## 2022-11-08 NOTE — Telephone Encounter (Signed)
She is in the hospital, having severe nose bleeds that wont stop.  Also COPD acting up, among other things. She cancelled her procedure Thursday. I just wanted to make you aware of what is going on with her.

## 2022-11-08 NOTE — Care Management Obs Status (Signed)
[place] NOTIFICATION   Patient Details  Name: Theresa [name] MRN: [id number] Date of Birth: 1956-09-29   Medicare Observation Status Notification Given:  Yes    Beverly Sessions, RN 11/08/2022, 2:25 PM

## 2022-11-08 NOTE — Progress Notes (Addendum)
Patient is alert and oriented x4. Reports weakness and dyspnea with exertion. Currently on 4L [place]. Refused bipap due to nose bleed. R-nare is packed and no blood is seen currently. Pain denied. Last bm she reported was on Sunday. Has some scratches/abrasions to her legs.Slight edema to bilateral legs.Uses rollator at home for ambulation. States that she is incontinent and requested purewick. TED hose applied per order. Plan of care explained. Additional needs denied.

## 2022-11-09 ENCOUNTER — Observation Stay: Payer: Medicare HMO

## 2022-11-09 DIAGNOSIS — I4892 Unspecified atrial flutter: Secondary | ICD-10-CM | POA: Diagnosis not present

## 2022-11-09 DIAGNOSIS — I10 Essential (primary) hypertension: Secondary | ICD-10-CM | POA: Diagnosis not present

## 2022-11-09 DIAGNOSIS — R040 Epistaxis: Secondary | ICD-10-CM | POA: Diagnosis not present

## 2022-11-09 DIAGNOSIS — R079 Chest pain, unspecified: Secondary | ICD-10-CM | POA: Diagnosis not present

## 2022-11-09 DIAGNOSIS — J441 Chronic obstructive pulmonary disease with (acute) exacerbation: Secondary | ICD-10-CM | POA: Diagnosis not present

## 2022-11-09 DIAGNOSIS — D62 Acute posthemorrhagic anemia: Secondary | ICD-10-CM | POA: Diagnosis not present

## 2022-11-09 LAB — CBC
HCT: 32.2 % — ABNORMAL LOW (ref 36.0–46.0)
Hemoglobin: 8.5 g/dL — ABNORMAL LOW (ref 12.0–15.0)
MCH: 26.8 pg (ref 26.0–34.0)
MCHC: 26.4 g/dL — ABNORMAL LOW (ref 30.0–36.0)
MCV: 101.6 fL — ABNORMAL HIGH (ref 80.0–100.0)
Platelets: 277 10*3/uL (ref 150–400)
RBC: 3.17 MIL/uL — ABNORMAL LOW (ref 3.87–5.11)
RDW: 22.8 % — ABNORMAL HIGH (ref 11.5–15.5)
WBC: 13.2 10*3/uL — ABNORMAL HIGH (ref 4.0–10.5)
nRBC: 0.4 % — ABNORMAL HIGH (ref 0.0–0.2)

## 2022-11-09 MED ORDER — OXYMETAZOLINE HCL 0.05 % NA SOLN: 1.0000 | Freq: Two times a day (BID) | NASAL | 0 refills | Status: DC

## 2022-11-09 MED ORDER — AZITHROMYCIN 250 MG PO TABS
500.0000 mg | ORAL_TABLET | Freq: Every day | ORAL | Status: AC
  Administered 2022-11-09: 500 mg via ORAL
  Filled 2022-11-09: qty 2

## 2022-11-09 MED ORDER — AZITHROMYCIN 250 MG PO TABS: ORAL_TABLET | ORAL | 0 refills | Status: DC

## 2022-11-09 MED ORDER — AZITHROMYCIN 250 MG PO TABS: 250.0000 mg | ORAL_TABLET | Freq: Every day | ORAL | Status: DC

## 2022-11-09 MED ORDER — FERROUS SULFATE 325 (65 FE) MG PO TBEC: 325.0000 mg | DELAYED_RELEASE_TABLET | Freq: Two times a day (BID) | ORAL | 3 refills | Status: DC

## 2022-11-09 MED ORDER — OXYMETAZOLINE HCL 0.05 % NA SOLN
1.0000 | Freq: Two times a day (BID) | NASAL | Status: DC
  Administered 2022-11-09: 1 via NASAL
  Filled 2022-11-09: qty 15

## 2022-11-09 MED ORDER — NICOTINE 14 MG/24HR TD PT24: 14.0000 mg | MEDICATED_PATCH | Freq: Every day | TRANSDERMAL | 0 refills | Status: DC | PRN

## 2022-11-09 MED ORDER — PREDNISONE 20 MG PO TABS: 40.0000 mg | ORAL_TABLET | Freq: Every day | ORAL | 0 refills | Status: AC

## 2022-11-09 MED ORDER — AMOXICILLIN-POT CLAVULANATE 875-125 MG PO TABS: 1.0000 | ORAL_TABLET | Freq: Two times a day (BID) | ORAL | 0 refills | Status: AC

## 2022-11-09 MED FILL — Iron Sucrose Inj 20 MG/ML (Fe Equiv): INTRAVENOUS | Qty: 10 | Status: AC

## 2022-11-09 NOTE — Progress Notes (Signed)
Discharge instructions were reviewed with pt. IV  was taken out. Questions were answered and encourage. Belonging were collected by patient.

## 2022-11-09 NOTE — Discharge Summary (Signed)
Physician Discharge Summary   Patient: Theresa [name] MRN: [id number] DOB: May 10, 1956  Admit date:     11/07/2022  Discharge date: 11/09/22  Discharge Physician: Lorella Nimrod   PCP: Juluis Pitch, MD   Recommendations at discharge:  Please obtain CBC and BMP in 1 week Follow-up with ENT Follow-up with primary care provider  Discharge Diagnoses: Principal Problem:   Epistaxis Active Problems:   ABLA (acute blood loss anemia)   COPD with acute exacerbation (HCC)   Paroxysmal atrial flutter (HCC)   Hypertension   Pulmonary hypertension (HCC)   Severe tobacco use disorder   Leukocytosis   Sleep apnea   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 [name] is a 67 year old female with history of GERD, obesity, sleep apnea, depression, anxiety, paroxysmal atrial flutter, on anticoagulation with Xarelto which is being held due to epistasis, recent hospitalization for acute blood loss anemia, who presents emergency department for chief concerns of repeat nosebleed.  Initial vitals in the emergency department showed temperature of 98.4, respiration rate of 18, heart rate 95, blood pressure 133/66, SpO2 of 90% on 2 L nasal cannula.  Serum sodium is 143, potassium 3.4, chloride 103, bicarb 30, BUN of 13, serum creatinine of 0.84, EGFR greater than 60, nonfasting blood glucose 98, WBC 11.3, hemoglobin 8.7, platelets of 244.  High sensitive troponin was 7 and on repeat is 7. COVID/influenza A/influenza B/RSV by PCR were negative.  ED treatment: Acetaminophen 1 g p.o. one-time dose, DuoNebs, Solu-Medrol 125 mg IV one-time dose, Afrin spray.  1/23: Hemodynamically stable.  No more active epistaxis, right nose with packing.  Patient got very upset when she was told that she can go home and follow-up with ENT as an outpatient,  stating that she has sinus congestion and will come back to ED if discharge. Keeping her for another night for her satisfaction.  No medical reason to remain in the hospital. Will continue Augmentin for 5 days for sinusitis and packing will remain in until next Monday.  1/24: Patient remained hemodynamically stable.  Remained on baseline oxygen requirement.  Hemoglobin increased to 8.5.  Today she was concern of increasing white cell count and does not want to go.  She was also having mild pleuritic chest pain and a repeat chest x-ray was without any acute abnormality.  Tried explaining to her that she is on steroids which can make her white cell count to go up.  She is also on antibiotics for sinusitis.  Then she started talking about possible UTI,  UA was not consistent with UTI, no more bleeding.  Is stable for discharge and need to have a follow-up with her providers and ENT as outpatient for removal of nasal packing. She was given 4 more days of antibiotic along with some prednisone for concern of COPD exacerbation on admission.  She will continue with rest of her home medications and need to have a close follow-up with her providers for further recommendations.  Assessment and Plan: * Epistaxis - Continue to monitor - Packing in place in the right nares and negative for bleeding in the left nares - Continue outpatient follow-up with ENT - Repeat CBC in the a.m.  ABLA (acute blood loss anemia) Hemoglobin decreased to 7.8 this morning, most likely secondary to significant epistaxis.  History of iron deficiency anemia -Increase the home dose of iron supplement  to twice daily -Monitor hemoglobin -Transfuse if below 7  COPD with acute exacerbation (HCC) With leukocytosis, procalcitonin negative.  Most likely reactive with significant epistaxis Patient also complaining of sinusitis. -Continue with Augmentin for concern of sinusitis and also keeping nasal packing until Monday  Paroxysmal  atrial flutter ([place]) -Continue amiodarone - Continue to hold home Xarelto due to persistent epistasis until follow-up with ENT and/or cardiology outpatient  Hypertension - Resumed home amiodarone 200 mg daily, diltiazem 180 mg daily, spironolactone 25 mg daily  Pulmonary hypertension (HCC) - BiPAP nightly ordered  Severe tobacco use disorder - Nicotine patch ordered - Resumed home Chantix 1 mg p.o. twice daily - Patient is currently in the process of quitting, her last cigarette was on Friday, 11/04/2022  Leukocytosis Most likely reactive, also concern of sinusitis.  Procalcitonin and UA negative Improving. 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 twice daily - Norco 5-325 mg, 1 tablet every 8 hours as needed for severe pain resumed - Morphine 2 mg IV every 4 hours as needed for severe pain, 4 doses ordered  Hyperlipidemia, unspecified - Simvastatin 20 mg nightly  GERD (gastroesophageal reflux disease) - PPI  Obesity (BMI 30-39.9) - This meets criteria for morbid obesity based on the presence of 1 or more chronic comorbidities. Patient has hypertension. This complicates overall care and prognosis.   Symptomatic anemia - CBC in the a.m.    Consultants: None Procedures performed: Nasal packing Disposition: Home Diet recommendation:  Discharge Diet Orders (From admission, onward)     Start     Ordered   11/09/22 0000  Diet - low sodium heart healthy        11/09/22 1343           Cardiac diet DISCHARGE MEDICATION: Allergies as of 11/09/2022       Reactions   Gabapentin Other (See Comments)   Higher doses of 800 mg, 600 mg, 300 mg causes excessive sedation and contributes to numerous mechanical falls. (Unsure if 100 mg dose can be tolerated)   Codeine Nausea And Vomiting   Meloxicam Other (See Comments)    Stomach pain        Medication List     STOP taking these medications    cephALEXin 500 MG capsule Commonly known as: KEFLEX       TAKE these medications    acetaminophen 500 MG tablet Commonly known as: TYLENOL Take 1,000 mg by mouth every 8 (eight) hours as needed for mild pain or headache.   albuterol 108 (90 Base) MCG/ACT inhaler Commonly known as: VENTOLIN HFA Inhale 2 puffs into the lungs every 6 (six) hours as needed for wheezing or shortness of breath.   amiodarone 200 MG tablet Commonly known as: PACERONE Take 1 tablet (200 mg total) by mouth daily.   amoxicillin-clavulanate 875-125 MG tablet Commonly known as: AUGMENTIN Take 1 tablet by mouth every 12 (twelve) hours for 5 days.   azithromycin 250 MG tablet Commonly known as: ZITHROMAX 1 tablet daily for next 4 days, starting from tomorrow Start taking on: November 10, 2022   busPIRone 5 MG tablet Commonly known as: BUSPAR TAKE 1 TABLET BY MOUTH TWICE DAILY   butalbital-acetaminophen-caffeine 50-325-40 MG tablet Commonly known as: FIORICET Take 1 tablet by mouth every 6 (six) hours as needed for headache. LIMIT USE TO PREVENT MEDICATION OVERUSE HEADACHE   Cholecalciferol 50 MCG (  2000 UT) Caps Take 2,000 Units by mouth daily.   dapagliflozin propanediol 10 MG Tabs tablet Commonly known as: Farxiga Take 1 tablet (10 mg total) by mouth daily before breakfast.   diclofenac Sodium 1 % Gel Commonly known as: VOLTAREN Apply 2 g topically 4 (four) times daily as needed (lower back and/or shoulder pain).   diltiazem 180 MG 24 hr capsule Commonly known as: CARDIZEM CD Take 180 mg by mouth daily.   ferrous sulfate 325 (65 FE) MG EC tablet Take 1 tablet (325 mg total) by mouth 2 (two) times daily with a meal. What changed: when to take this   Fluticasone-Umeclidin-Vilant 100-62.5-25 MCG/INH Aepb Inhale 1 puff into the lungs daily.   Furoscix 80 MG/10ML Ctkt Generic drug: Furosemide Inject into the  skin.   HYDROcodone-acetaminophen 5-325 MG tablet Commonly known as: NORCO/VICODIN Take 1 tablet by mouth every 8 (eight) hours as needed for severe pain. Must last 30 days   HYDROcodone-acetaminophen 5-325 MG tablet Commonly known as: NORCO/VICODIN Take 1 tablet by mouth every 8 (eight) hours as needed for severe pain. Must last 30 days Start taking on: November 30, 2022   HYDROcodone-acetaminophen 5-325 MG tablet Commonly known as: NORCO/VICODIN Take 1 tablet by mouth every 8 (eight) hours as needed for severe pain. Must last 30 days Start taking on: December 30, 2022   ipratropium-albuterol 0.5-2.5 (3) MG/3ML Soln Commonly known as: DUONEB USE 1 VIAL VIA NEBULIZER EVERY 6 HOURS AS NEEDED FOR SHORTNESS OF BREATH OR WHEEZING   melatonin 5 MG Tabs Take 5 mg by mouth at bedtime.   naloxone 4 MG/0.1ML Liqd nasal spray kit Commonly known as: NARCAN Place 1 spray into the nose as needed for up to 365 doses (for opioid-induced respiratory depresssion). In case of emergency (overdose), spray once into each nostril. If no response within 3 minutes, repeat application and call [phone].   nicotine 14 mg/24hr patch Commonly known as: NICODERM CQ - dosed in mg/24 hours Place 1 patch (14 mg total) onto the skin daily as needed (nicotine craving).   omeprazole 40 MG capsule Commonly known as: PRILOSEC Take 40 mg by mouth daily.   polyethylene glycol 17 g packet Commonly known as: MIRALAX / GLYCOLAX Take 17 g by mouth daily.   potassium chloride SA 20 MEQ tablet Commonly known as: KLOR-CON M Take 2 tablets every morning and 1 tablet every evening   predniSONE 20 MG tablet Commonly known as: DELTASONE Take 2 tablets (40 mg total) by mouth daily with breakfast for 4 days. Start taking on: November 10, 2022   roflumilast 500 MCG Tabs tablet Commonly known as: DALIRESP Take 500 mcg by mouth daily.   sertraline 100 MG tablet Commonly known as: ZOLOFT TAKE 2 TABLETS BY MOUTH ONCE EVERY  MORNING   simvastatin 20 MG tablet Commonly known as: ZOCOR Take 20 mg by mouth at bedtime.   spironolactone 25 MG tablet Commonly known as: ALDACTONE Take 25 mg by mouth daily.   torsemide 20 MG tablet Commonly known as: DEMADEX Take 40-80 mg by mouth See admin instructions. Take 4 tablets (5m) by mouth every morning and take 2 tablets (472m by mouth every afternoon   varenicline 1 MG tablet Commonly known as: CHANTIX Take 1 mg by mouth 2 (two) times daily.        Discharge Exam: Filed Weights   11/08/22 0301  Weight: 99 kg   General.  Obese lady, in no acute distress.  Right naris packing Pulmonary.  Lungs clear  bilaterally, normal respiratory effort. CV.  Regular rate and rhythm, no JVD, rub or murmur. Abdomen.  Soft, nontender, nondistended, BS positive. CNS.  Alert and oriented .  No focal neurologic deficit. Extremities.  No edema, no cyanosis, pulses intact and symmetrical. Psychiatry.  Judgment and insight appears normal.   Condition at discharge: stable  The results of significant diagnostics from this hospitalization (including imaging, microbiology, ancillary and laboratory) are listed below for reference.   Imaging Studies: DG Chest 2 View  Result Date: 11/09/2022 CLINICAL DATA:  Pleuritic chest pain in a 67 year old female. EXAM: CHEST - 2 VIEW COMPARISON:  November 07, 2022 FINDINGS: Lungs are mildly hyperexpanded with flattening of LEFT and RIGHT hemidiaphragm. Mild central airway thickening. No consolidation or sign of pleural effusion. Heart size mildly enlarged and accentuated by AP projection. On limited assessment no acute skeletal process. IMPRESSION: 1. Findings likely related to COPD without consolidation or effusion. Electronically Signed   By: Zetta Bills M.D.   On: 11/09/2022 13:04   CT Head Wo Contrast  Result Date: 11/07/2022 CLINICAL DATA:  Headache, increasing frequency or severity EXAM: CT HEAD WITHOUT CONTRAST TECHNIQUE: Contiguous  axial images were obtained from the base of the skull through the vertex without intravenous contrast. RADIATION DOSE REDUCTION: This exam was performed according to the departmental dose-optimization program which includes automated exposure control, adjustment of the mA and/or kV according to patient size and/or use of iterative reconstruction technique. COMPARISON:  04/08/2022 FINDINGS: Brain: No evidence of acute infarction, hemorrhage, hydrocephalus, extra-axial collection or mass lesion/mass effect. Scattered low-density changes within the periventricular and subcortical white matter compatible with chronic microvascular ischemic change. Vascular: Atherosclerotic calcifications involving the large vessels of the skull base. No unexpected hyperdense vessel. Skull: Normal. Negative for fracture or focal lesion. Sinuses/Orbits: Findings of sinusitis involving the right maxillary sinus and to a lesser degree the right sphenoid, ethmoid, and frontal sinuses. Right-sided nasal mucosal thickening. Mastoid air cells are clear. Other: None. IMPRESSION: 1. No acute intracranial findings. 2. Findings of sinusitis involving the right maxillary sinus and to a lesser degree the right sphenoid, ethmoid, and frontal sinuses. 3. Chronic microvascular ischemic change. Electronically Signed   By: Davina Poke D.O.   On: 11/07/2022 13:54   DG Chest 2 View  Result Date: 11/07/2022 CLINICAL DATA:  SOB chronic cough EXAM: CHEST - 2 VIEW COMPARISON:  11/05/2022 FINDINGS: The heart size and mediastinal contours are within normal limits. Lungs are hyperinflated suggesting COPD. There is no focal consolidation. No pneumothorax or pleural effusion. Aorta is calcified. Osseous structures are osteopenic. There are thoracic degenerative changes. IMPRESSION: Findings suggest COPD.  Otherwise no active cardiopulmonary disease. Electronically Signed   By: Sammie Bench M.D.   On: 11/07/2022 12:16   DG Chest Portable 1  View  Result Date: 11/05/2022 CLINICAL DATA:  Patient with cough.  COPD. EXAM: PORTABLE CHEST 1 VIEW COMPARISON:  Chest radiograph 06/19/2022 FINDINGS: Stable enlarged cardiac and mediastinal contours. Aortic tortuosity. Basilar heterogeneous opacities. No pleural effusion or pneumothorax. Thoracic spine degenerative changes. IMPRESSION: Basilar atelectasis. Electronically Signed   By: Lovey Newcomer M.D.   On: 11/05/2022 14:05    Microbiology: Results for orders placed or performed during the hospital encounter of 11/07/22  Resp panel by RT-PCR (RSV, Flu A&B, Covid) Anterior Nasal Swab     Status: None   Collection Time: 11/07/22 11:53 AM   Specimen: Anterior Nasal Swab  Result Value Ref Range Status   SARS Coronavirus 2 by RT PCR NEGATIVE NEGATIVE  Final    Comment: (NOTE) SARS-CoV-2 target nucleic acids are NOT DETECTED.  The SARS-CoV-2 RNA is generally detectable in upper respiratory specimens during the acute phase of infection. The lowest concentration of SARS-CoV-2 viral copies this assay can detect is 138 copies/mL. A negative result does not preclude SARS-Cov-2 infection and should not be used as the sole basis for treatment or other patient management decisions. A negative result may occur with  improper specimen collection/handling, submission of specimen other than nasopharyngeal swab, presence of viral mutation(s) within the areas targeted by this assay, and inadequate number of viral copies(<138 copies/mL). A negative result must be combined with clinical observations, patient history, and epidemiological information. The expected result is Negative.  Fact Sheet for Patients:  EntrepreneurPulse.com.au  Fact Sheet for Healthcare Providers:  IncredibleEmployment.be  This test is no t yet approved or cleared by the [place] FDA and  has been authorized for detection and/or diagnosis of SARS-CoV-2 by FDA under an Emergency Use  Authorization (EUA). This EUA will remain  in effect (meaning this test can be used) for the duration of the COVID-19 declaration under Section 564(b)(1) of the Act, 21 U.S.C.section 360bbb-3(b)(1), unless the authorization is terminated  or revoked sooner.       Influenza A by PCR NEGATIVE NEGATIVE Final   Influenza B by PCR NEGATIVE NEGATIVE Final    Comment: (NOTE) The Xpert Xpress SARS-CoV-2/FLU/RSV plus assay is intended as an aid in the diagnosis of influenza from Nasopharyngeal swab specimens and should not be used as a sole basis for treatment. Nasal washings and aspirates are unacceptable for Xpert Xpress SARS-CoV-2/FLU/RSV testing.  Fact Sheet for Patients: EntrepreneurPulse.com.au  Fact Sheet for Healthcare Providers: IncredibleEmployment.be  This test is not yet approved or cleared by the [place] FDA and has been authorized for detection and/or diagnosis of SARS-CoV-2 by FDA under an Emergency Use Authorization (EUA). This EUA will remain in effect (meaning this test can be used) for the duration of the COVID-19 declaration under Section 564(b)(1) of the Act, 21 U.S.C. section 360bbb-3(b)(1), unless the authorization is terminated or revoked.     Resp Syncytial Virus by PCR NEGATIVE NEGATIVE Final    Comment: (NOTE) Fact Sheet for Patients: EntrepreneurPulse.com.au  Fact Sheet for Healthcare Providers: IncredibleEmployment.be  This test is not yet approved or cleared by the [place] FDA and has been authorized for detection and/or diagnosis of SARS-CoV-2 by FDA under an Emergency Use Authorization (EUA). This EUA will remain in effect (meaning this test can be used) for the duration of the COVID-19 declaration under Section 564(b)(1) of the Act, 21 U.S.C. section 360bbb-3(b)(1), unless the authorization is terminated or revoked.  Performed at [facility], [address] [place], [place] [address]     Labs: CBC: Recent Labs  Lab 11/05/22 1359 11/06/22 0444 11/07/22 1137 11/08/22 0439 11/09/22 1033  WBC 10.4 8.4 11.3* 11.1* 13.2*  NEUTROABS 8.9*  --  9.2*  --   --   HGB 7.5* 7.9* 8.7* 7.8* 8.5*  HCT 28.4* 28.5* 32.1* 29.0* 32.2*  MCV 97.6 96.3 100.3* 100.0 101.6*  PLT 239 249 244 258 979   Basic Metabolic Panel: Recent Labs  Lab 11/05/22 1359 11/06/22 0444 11/07/22 1137 11/08/22 0439  NA 138 137 143 139  K 3.7 3.7 3.4* 3.9  CL 103 103 103 102  CO2 _0 GLUCOSE 104* 141* 98 126*  BUN _1 CREATININE 0.66 0.71 0.84 0.71  CALCIUM  8.8* 8.1* 8.9 8.6*  MG  --   --   --  2.8*   Liver Function Tests: Recent Labs  Lab 11/05/22 1359  AST 23  ALT 18  ALKPHOS 82  BILITOT 0.4  PROT 6.7  ALBUMIN 3.5   CBG: No results for input(s): "GLUCAP" in the last 168 hours.  Discharge time spent: greater than 30 minutes.  This record has been created using Systems analyst. Errors have been sought and corrected,but may not always be located. Such creation errors do not reflect on the standard of care.   Signed: Lorella Nimrod, MD [facility] 11/09/2022

## 2022-11-09 NOTE — Plan of Care (Signed)

## 2022-11-09 NOTE — Consult Note (Signed)
Theresa [name], Theresa [name] [id number] 10/01/1956 Theresa Nearing, MD  Reason for Consult: Epistaxis Requesting Physician: Lorella Nimrod, MD Consulting Physician: Theresa Nearing, MD  HPI: This 67 y.o. year old female was admitted on 11/07/2022 for Epistaxis [R04.0] COPD exacerbation ([place]) [J44.1] COPD with acute exacerbation ([place]) [J44.1]. Patient is in the hospital after having had a heavy nosebleed from the right side that was packed last weekend.  She was initially discharged with a plan to follow-up with Dr. Tami [name] for removal of the pack tomorrow, but returned with bleeding from the opposite side which ultimately resolved spontaneously.  Dr. Charolett Bumpers was contacted, but with the bleeding stopped he just recommended observation so she was kept in the hospital.  Plan was to discharge and have her follow-up tomorrow with Dr. Tami [name] for pack removal, but she got up this afternoon and had some new bleeding from the left side which resolved spontaneously.  She is on nasal cannula oxygen both at home and while in the hospital.  She was on Xarelto for atrial fibrillation but that was stopped last weekend.  Blood pressure has been well-controlled during this hospitalization. Today's episode involved bleeding from the front of the nose although some blood did eventually run down the throat.  Allergies:  Allergies  Allergen Reactions   Gabapentin Other (See Comments)    Higher doses of 800 mg, 600 mg, 300 mg causes excessive sedation and contributes to numerous mechanical falls. (Unsure if 100 mg dose can be tolerated)   Codeine Nausea And Vomiting   Meloxicam Other (See Comments)    Stomach pain    Medications:  Medications Prior to Admission  Medication Sig Dispense Refill   acetaminophen (TYLENOL) 500 MG tablet Take 1,000 mg by mouth every 8 (eight) hours as needed for mild pain or headache.     albuterol (VENTOLIN HFA) 108 (90 Base) MCG/ACT inhaler Inhale 2 puffs into the lungs every 6 (six) hours as needed  for wheezing or shortness of breath. 8 g 1   amiodarone (PACERONE) 200 MG tablet Take 1 tablet (200 mg total) by mouth daily. 30 tablet 0   busPIRone (BUSPAR) 5 MG tablet TAKE 1 TABLET BY MOUTH TWICE DAILY 180 tablet 1   butalbital-acetaminophen-caffeine (FIORICET) 50-325-40 MG tablet Take 1 tablet by mouth every 6 (six) hours as needed for headache. LIMIT USE TO PREVENT MEDICATION OVERUSE HEADACHE 14 tablet 0   Cholecalciferol 50 MCG (2000 UT) CAPS Take 2,000 Units by mouth daily.      dapagliflozin propanediol (FARXIGA) 10 MG TABS tablet Take 1 tablet (10 mg total) by mouth daily before breakfast. 30 tablet 5   diclofenac Sodium (VOLTAREN) 1 % GEL Apply 2 g topically 4 (four) times daily as needed (lower back and/or shoulder pain).     diltiazem (CARDIZEM CD) 180 MG 24 hr capsule Take 180 mg by mouth daily.     Fluticasone-Umeclidin-Vilant 100-62.5-25 MCG/INH AEPB Inhale 1 puff into the lungs daily.     FUROSCIX 80 MG/10ML CTKT Inject into the skin.     HYDROcodone-acetaminophen (NORCO/VICODIN) 5-325 MG tablet Take 1 tablet by mouth every 8 (eight) hours as needed for severe pain. Must last 30 days 90 tablet 0   ipratropium-albuterol (DUONEB) 0.5-2.5 (3) MG/3ML SOLN USE 1 VIAL VIA NEBULIZER EVERY 6 HOURS AS NEEDED FOR SHORTNESS OF BREATH OR WHEEZING 1080 mL 0   melatonin 5 MG TABS Take 5 mg by mouth at bedtime.     naloxone (NARCAN) nasal spray 4 mg/0.1 mL Place 1 spray  into the nose as needed for up to 365 doses (for opioid-induced respiratory depresssion). In case of emergency (overdose), spray once into each nostril. If no response within 3 minutes, repeat application and call 850. 1 each 0   omeprazole (PRILOSEC) 40 MG capsule Take 40 mg by mouth daily.     polyethylene glycol (MIRALAX / GLYCOLAX) 17 g packet Take 17 g by mouth daily.     potassium chloride SA (KLOR-CON M) 20 MEQ tablet Take 2 tablets every morning and 1 tablet every evening 270 tablet 3   roflumilast (DALIRESP) 500 MCG TABS  tablet Take 500 mcg by mouth daily.     sertraline (ZOLOFT) 100 MG tablet TAKE 2 TABLETS BY MOUTH ONCE EVERY MORNING 180 tablet 0   simvastatin (ZOCOR) 20 MG tablet Take 20 mg by mouth at bedtime.     spironolactone (ALDACTONE) 25 MG tablet Take 25 mg by mouth daily.     torsemide (DEMADEX) 20 MG tablet Take 40-80 mg by mouth See admin instructions. Take 4 tablets (42m) by mouth every morning and take 2 tablets (447m by mouth every afternoon     varenicline (CHANTIX) 1 MG tablet Take 1 mg by mouth 2 (two) times daily.     [DISCONTINUED] ferrous sulfate 325 (65 FE) MG EC tablet Take 325 mg by mouth daily.     cephALEXin (KEFLEX) 500 MG capsule Take 1 capsule (500 mg total) by mouth 4 (four) times daily for 7 days. 28 capsule 0   [START ON 11/30/2022] HYDROcodone-acetaminophen (NORCO/VICODIN) 5-325 MG tablet Take 1 tablet by mouth every 8 (eight) hours as needed for severe pain. Must last 30 days 90 tablet 0   [START ON 12/30/2022] HYDROcodone-acetaminophen (NORCO/VICODIN) 5-325 MG tablet Take 1 tablet by mouth every 8 (eight) hours as needed for severe pain. Must last 30 days 90 tablet 0  .  Current Facility-Administered Medications  Medication Dose Route Frequency Provider Last Rate Last Admin   acetaminophen (TYLENOL) tablet 650 mg  650 mg Oral Q6H PRN Cox, Amy N, DO       Or   acetaminophen (TYLENOL) suppository 650 mg  650 mg Rectal Q6H PRN Cox, Amy N, DO       amiodarone (PACERONE) tablet 200 mg  200 mg Oral Daily Cox, Amy N, DO   200 mg at 11/09/22 0852   amoxicillin-clavulanate (AUGMENTIN) 875-125 MG per tablet 1 tablet  1 tablet Oral Q12H AmLorella NimrodMD   1 tablet at 11/09/22 0859   atorvastatin (LIPITOR) tablet 10 mg  10 mg Oral QHS DoDorothe PeaRPH   10 mg at 11/08/22 2217   [START ON 11/10/2022] azithromycin (ZITHROMAX) tablet 250 mg  250 mg Oral Daily AmLorella NimrodMD       busPIRone (BUSPAR) tablet 5 mg  5 mg Oral BID Cox, Amy N, DO   5 mg at 11/09/22 0854    butalbital-acetaminophen-caffeine (FIORICET) 50-325-40 MG per tablet 1 tablet  1 tablet Oral Q6H PRN Cox, Amy N, DO   1 tablet at 11/09/22 1534   diltiazem (CARDIZEM CD) 24 hr capsule 180 mg  180 mg Oral Daily Cox, Amy N, DO   180 mg at 11/09/22 0900   ferrous sulfate tablet 325 mg  325 mg Oral BID WC AmLorella NimrodMD   325 mg at 11/09/22 1526   HYDROcodone-acetaminophen (NORCO/VICODIN) 5-325 MG per tablet 1 tablet  1 tablet Oral Q8H PRN Cox, Amy N, DO   1 tablet at 11/08/22 [phone]  ipratropium-albuterol (DUONEB) 0.5-2.5 (3) MG/3ML nebulizer solution 3 mL  3 mL Nebulization TID Cox, Amy N, DO   3 mL at 11/09/22 1314   melatonin tablet 5 mg  5 mg Oral QHS Cox, Amy N, DO   5 mg at 11/08/22 2217   nicotine (NICODERM CQ - dosed in mg/24 hours) patch 14 mg  14 mg Transdermal Daily PRN Cox, Amy N, DO       ondansetron (ZOFRAN) tablet 4 mg  4 mg Oral Q6H PRN Cox, Amy N, DO       Or   ondansetron (ZOFRAN) injection 4 mg  4 mg Intravenous Q6H PRN Cox, Amy N, DO       oxymetazoline (AFRIN) 0.05 % nasal spray 1 spray  1 spray Each Nare BID Lorella Nimrod, MD   1 spray at 11/09/22 1525   pantoprazole (PROTONIX) EC tablet 80 mg  80 mg Oral Daily Cox, Amy N, DO   80 mg at 11/09/22 9629   predniSONE (DELTASONE) tablet 40 mg  40 mg Oral Q breakfast Lorella Nimrod, MD   40 mg at 11/09/22 0849   senna-docusate (Senokot-S) tablet 1 tablet  1 tablet Oral QHS PRN Cox, Amy N, DO   1 tablet at 11/09/22 0901   sertraline (ZOLOFT) tablet 100 mg  100 mg Oral QHS Cox, Amy N, DO   100 mg at 11/08/22 2217   spironolactone (ALDACTONE) tablet 25 mg  25 mg Oral Daily Cox, Amy N, DO   25 mg at 11/09/22 0858   varenicline (CHANTIX) tablet 1 mg  1 mg Oral BID Cox, Amy N, DO   1 mg at 11/09/22 5284    PMH:  Past Medical History:  Diagnosis Date   Acute postoperative pain 10/03/2017   Anxiety    BiPAP (biphasic positive airway pressure) dependence    at hs   Carpal tunnel syndrome    CHF (congestive heart failure) ([place])     1/18   CHF (congestive heart failure) ([facility])    Chronic generalized abdominal pain    Chronic rhinitis    COPD (chronic obstructive pulmonary disease) ([facility])    DDD (degenerative disc disease), cervical    DDD (degenerative disc disease), lumbosacral    Depression    Dyspnea    Edema    Flu    1/18   Gastritis    GERD (gastroesophageal reflux disease)    Hematuria    Hernia of abdominal wall 07/17/2018   Hernia, abdominal    Hypertension    Kidney stones    Low back pain    Lumbar radiculitis    Malodorous urine    Muscle weakness    Obesity    Oxygen dependent    Pneumonia due to COVID-19 virus 02/03/2020   Renal cyst    Sensory urge incontinence    Thyroid activity decreased    9/19   Tobacco abuse    Wheezing     Fam Hx:  Family History  Problem Relation Age of Onset   Heart disease Mother    Stroke Mother    Coronary artery disease Mother    Alcohol abuse Father    Heart disease Father    Lung cancer Sister    Cancer Sister    Breast cancer Sister    Cancer Brother    Cancer Brother    Kidney cancer Brother    Pneumonia Brother    Prostate cancer Neg Hx    Bladder Cancer Neg Hx  Soc Hx:  Social History   Socioeconomic History   Marital status: Married    Spouse name: Ovid Curd   Number of children: 2   Years of education: Not on file   Highest education level: 12th grade  Occupational History   Occupation: disability  Tobacco Use   Smoking status: Former    Packs/day: 1.50    Years: 44.00    Total pack years: 66.00    Types: Cigarettes    Quit date: 05/24/2022    Years since quitting: 0.4   Smokeless tobacco: Never   Tobacco comments:    over a half pack  Vaping Use   Vaping Use: Never used  Substance and Sexual Activity   Alcohol use: No   Drug use: Yes    Comment: prescribed hydrocodone   Sexual activity: Not Currently    Birth control/protection: Surgical  Other Topics Concern   Not on file  Social History Narrative   ** Merged  History Encounter **       Lives with spouse    Social Determinants of Health   Financial Resource Strain: Low Risk  (09/20/2021)   Overall Financial Resource Strain (CARDIA)    Difficulty of Paying Living Expenses: Not very hard  Food Insecurity: No Food Insecurity (11/08/2022)   Hunger Vital Sign    Worried About Running Out of Food in the Last Year: Never true    [place] in the Last Year: Never true  Transportation Needs: No Transportation Needs (11/08/2022)   PRAPARE - Hydrologist (Medical): No    Lack of Transportation (Non-Medical): No  Physical Activity: Inactive (09/20/2021)   Exercise Vital Sign    Days of Exercise per Week: 0 days    Minutes of Exercise per Session: 0 min  Stress: No Stress Concern Present (09/20/2021)   [place]    Feeling of Stress : Not at all  Social Connections: Moderately Integrated (09/20/2021)   Social Connection and Isolation Panel [NHANES]    Frequency of Communication with Friends and Family: More than three times a week    Frequency of Social Gatherings with Friends and Family: Once a week    Attends Religious Services: More than 4 times per year    Active Member of [employer] or Organizations: No    Attends Archivist Meetings: Never    Marital Status: Married  Human resources officer Violence: Not At Risk (11/08/2022)   Humiliation, Afraid, Rape, and Kick questionnaire    Fear of Current or Ex-Partner: No    Emotionally Abused: No    Physically Abused: No    Sexually Abused: No    PSH:  Past Surgical History:  Procedure Laterality Date   ABDOMINAL HYSTERECTOMY     CARDIOVERSION N/A 04/29/2022   Procedure: CARDIOVERSION;  Surgeon: Corey Skains, MD;  Location: [facility] OR[facility];  Service: Cardiovascular;  Laterality: N/A;   CARPAL TUNNEL RELEASE Left 2012   cataract surgery Bilateral    EPIGASTRIC HERNIA REPAIR N/A 08/13/2018   HERNIA  REPAIR EPIGASTRIC ADULT; polypropylene mesh reinforcement.  Surgeon: Robert Bellow, MD;  Location: [facility] OR[facility];  Service: General;  Laterality: N/A;   ESOPHAGOGASTRODUODENOSCOPY N/A 06/21/2022   Procedure: ESOPHAGOGASTRODUODENOSCOPY (EGD);  Surgeon: Tol[place], Benay Pike, MD;  Location: [facility];  Service: Gastroenterology;  Laterality: N/A;   RIGHT/LEFT HEART CATH AND CORONARY ANGIOGRAPHY N/A 07/11/2019   Procedure: RIGHT/LEFT HEART CATH AND CORONARY ANGIOGRAPHY;  Surgeon: Clayborn Bigness,  Loran Senters, MD;  Location: [facility];  Service: Cardiovascular;  Laterality: N/A;   TEE WITHOUT CARDIOVERSION N/A 04/29/2022   Procedure: TRANSESOPHAGEAL ECHOCARDIOGRAM (TEE);  Surgeon: Corey Skains, MD;  Location: [facility];  Service: Cardiovascular;  Laterality: N/A;   TONSILLECTOMY     TUBAL LIGATION     UMBILICAL HERNIA REPAIR N/A 08/13/2018   HERNIA REPAIR UMBILICAL ADULT; polypropylene mesh reinforcement Surgeon: Robert Bellow, MD;  Location: [facility];  Service: General;  Laterality: N/A;  . Procedures since admission: No admission procedures for hospital encounter.  ROS: Review of systems normal other than 12 systems except per HPI.  PHYSICAL EXAM Vitals:  Vitals:   11/09/22 0810 11/09/22 1500  BP: 114/62 122/68  Pulse: 73 71  Resp: 16 18  Temp: 98.6 F (37 C) 98.3 F (36.8 C)  SpO2: 100% 100%  .  General: Well-developed, Well-nourished in no acute distress Mood: Mood and affect well adjusted, pleasant and cooperative. Orientation: Grossly alert and oriented. Vocal Quality: No hoarseness. Communicates verbally. head and Face: NCAT. No facial asymmetry. No visible skin lesions. No significant facial scars. No tenderness with sinus percussion. Facial strength normal and symmetric. Ears: External ears with normal landmarks, no lesions. External auditory canals free of infection, cerumen impaction or lesions. Tympanic membranes intact with good landmarks and normal mobility on  pneumatic otoscopy. No middle ear effusion. Hearing: Speech reception grossly normal. Nose: External nose normal with midline dorsum and no lesions or deformity. Nasal Cavity reveals a pack in the right nasal cavity with no active bleeding. On the left the septum appears mildly excoriated anteriorly from dryness but again, no active bleeding. Mild left septal deviation. Oral Cavity/ Oropharynx: Lips are normal with no lesions. Teeth no frank dental caries. Gingiva healthy with no lesions or gingivitis. No bleeding down the pharynx. Indirect Laryngoscopy/Nasopharyngoscopy: Visualization of the larynx, hypopharynx and nasopharynx is not possible in this setting with routine examination. Neck: Supple and symmetric with no palpable masses, tenderness or crepitance. The trachea is midline. Thyroid gland is soft, nontender and symmetric with no masses or enlargement. Parotid and submandibular glands are soft, nontender and symmetric, without masses. Lymphatic: no adenopathy. Respiratory: Normal respiratory effort without labored breathing. Cardiovascular: Carotid pulse shows regular rate and rhythm Neurologic: Cranial Nerves II through XII are grossly intact. Eyes: Gaze and Ocular Motility are grossly normal. PERRLA. No visible nystagmus.  MEDICAL DECISION MAKING: Data Review:  Results for orders placed or performed during the hospital encounter of 11/07/22 (from the past 48 hour(s))  Urinalysis, Complete w Microscopic     Status: Abnormal   Collection Time: 11/07/22  7:02 PM  Result Value Ref Range   Color, Urine YELLOW (A) YELLOW   APPearance CLEAR (A) CLEAR   Specific Gravity, Urine 1.015 1.005 - 1.030   pH 6.0 5.0 - 8.0   Glucose, UA >=500 (A) NEGATIVE mg/dL   Hgb urine dipstick NEGATIVE NEGATIVE   Bilirubin Urine NEGATIVE NEGATIVE   Ketones, ur NEGATIVE NEGATIVE mg/dL   Protein, ur NEGATIVE NEGATIVE mg/dL   Nitrite NEGATIVE NEGATIVE   Leukocytes,Ua NEGATIVE NEGATIVE   RBC / HPF 0-5 0 - 5  RBC/hpf   WBC, UA 0-5 0 - 5 WBC/hpf   Bacteria, UA NONE SEEN NONE SEEN   Squamous Epithelial / HPF 0-5 0 - 5 /HPF    Comment: Performed at [facility], [address], [place], [place] [address]  Basic metabolic panel     Status: Abnormal   Collection  Time: 11/08/22  4:39 AM  Result Value Ref Range   Sodium 139 135 - 145 mmol/L   Potassium 3.9 3.5 - 5.1 mmol/L   Chloride 102 98 - 111 mmol/L   CO2 31 22 - 32 mmol/L   Glucose, Bld 126 (H) 70 - 99 mg/dL    Comment: Glucose reference range applies only to samples taken after fasting for at least 8 hours.   BUN 12 8 - 23 mg/dL   Creatinine, Ser 0.71 0.44 - 1.00 mg/dL   Calcium 8.6 (L) 8.9 - 10.3 mg/dL   GFR, Estimated >60 >60 mL/min    Comment: (NOTE) Calculated using the CKD-EPI Creatinine Equation (2021)    Anion gap 6 5 - 15    Comment: Performed at [facility] [address] [place], [place] [address]  CBC     Status: Abnormal   Collection Time: 11/08/22  4:39 AM  Result Value Ref Range   WBC 11.1 (H) 4.0 - 10.5 K/uL   RBC 2.90 (L) 3.87 - 5.11 MIL/uL   Hemoglobin 7.8 (L) 12.0 - 15.0 g/dL   HCT 29.0 (L) 36.0 - 46.0 %   MCV 100.0 80.0 - 100.0 fL   MCH 26.9 26.0 - 34.0 pg   MCHC 26.9 (L) 30.0 - 36.0 g/dL   RDW 23.7 (H) 11.5 - 15.5 %   Platelets 258 150 - 400 K/uL   nRBC 0.0 0.0 - 0.2 %    Comment: Performed at [facility], [address] [place], [place] [address]  Magnesium     Status: Abnormal   Collection Time: 11/08/22  4:39 AM  Result Value Ref Range   Magnesium 2.8 (H) 1.7 - 2.4 mg/dL    Comment: Performed at [facility], [address] [place], [place] [address]  CBC     Status: Abnormal   Collection Time: 11/09/22 10:33 AM  Result Value Ref Range   WBC 13.2 (H) 4.0 - 10.5 K/uL   RBC 3.17 (L) 3.87 - 5.11 MIL/uL   Hemoglobin 8.5 (L) 12.0 - 15.0 g/dL   HCT 32.2 (L) 36.0 - 46.0 %   MCV 101.6 (H) 80.0 - 100.0 fL   MCH 26.8 26.0 - 34.0 pg   MCHC 26.4 (L) 30.0 - 36.0 g/dL    RDW 22.8 (H) 11.5 - 15.5 %   Platelets 277 150 - 400 K/uL   nRBC 0.4 (H) 0.0 - 0.2 %    Comment: Performed at [facility], [address] [place],  [address]  . DG Chest 2 View  Result Date: 11/09/2022 CLINICAL DATA:  Pleuritic chest pain in a 67 year old female. EXAM: CHEST - 2 VIEW COMPARISON:  November 07, 2022 FINDINGS: Lungs are mildly hyperexpanded with flattening of LEFT and RIGHT hemidiaphragm. Mild central airway thickening. No consolidation or sign of pleural effusion. Heart size mildly enlarged and accentuated by AP projection. On limited assessment no acute skeletal process. IMPRESSION: 1. Findings likely related to COPD without consolidation or effusion. Electronically Signed   By: Zetta Bills M.D.   On: 11/09/2022 13:04  .   PROCEDURE: Preoperative Diagnosis: Recurrent epistaxis Postoperative Diagnosis: Same Procedure: Packing of left nasal cavity for control of epistaxis Indications: Recurrent epistaxis  Findings:Anterior nasal excoriation, dryness, no active bleeding Description of Procedure: After discussing procedure and risks  (primarily bleeding) with the patient, a 4 cm Merocel pack was lubricated with Surgilube and placed without difficulty. Afrin was used to expand the pack. The patient tolerated the  procedure well.   ASSESSMENT: Recurrent epistaxis.  Nasal dryness is likely a major factor in this.  The bleeding on the left side was not as severe, and now she is off of Xarelto.  We discussed the option of holding off on packing and moisturizing the left nasal cavity with saline gel to prevent dryness from further breaking down the nasal mucosa and causing more bleeding, but she was very concerned about the bleeding recurring and requiring a trip back to the hospital.  She expressed a preference for having a pack placed which was performed without difficulty.    PLAN: I would leave both packs in place until this coming Monday and she can reschedule her  follow-up with Dr. Tami [name] and have both packs removed concurrently.  This will provide protection of the nasal lining on both sides to prevent further excoriation and allow complete healing.  Once the packs are removed nasal saline gel can be used at home to help moisturize the nose, as her nasal cannula oxygen is likely drying her out despite the fact that she has humidity on it.  If necessary, office cauterization could be performed if there is some bleeding with pack removal next week.  She will need to be on prophylactic antibiotics such as Keflex or doxycycline while the packs are in place.  Apparently she was going to be discharged on Augmentin, which should be adequate coverage as long as she continues that until the packs are removed.   Theresa Nearing, MD 11/09/2022 5:40 PM

## 2022-11-10 ENCOUNTER — Inpatient Hospital Stay: Payer: Medicare HMO

## 2022-11-10 ENCOUNTER — Ambulatory Visit: Payer: Medicare HMO | Admitting: Pain Medicine

## 2022-11-11 DIAGNOSIS — J439 Emphysema, unspecified: Secondary | ICD-10-CM | POA: Diagnosis not present

## 2022-11-11 DIAGNOSIS — M7989 Other specified soft tissue disorders: Secondary | ICD-10-CM | POA: Diagnosis not present

## 2022-11-11 DIAGNOSIS — I700 Atherosclerosis of aorta: Secondary | ICD-10-CM | POA: Diagnosis not present

## 2022-11-11 DIAGNOSIS — I272 Pulmonary hypertension, unspecified: Secondary | ICD-10-CM | POA: Diagnosis not present

## 2022-11-11 DIAGNOSIS — J9621 Acute and chronic respiratory failure with hypoxia: Secondary | ICD-10-CM | POA: Diagnosis not present

## 2022-11-11 DIAGNOSIS — J432 Centrilobular emphysema: Secondary | ICD-10-CM | POA: Diagnosis not present

## 2022-11-11 DIAGNOSIS — R0600 Dyspnea, unspecified: Secondary | ICD-10-CM | POA: Diagnosis not present

## 2022-11-11 DIAGNOSIS — J441 Chronic obstructive pulmonary disease with (acute) exacerbation: Secondary | ICD-10-CM | POA: Diagnosis not present

## 2022-11-11 DIAGNOSIS — I4891 Unspecified atrial fibrillation: Secondary | ICD-10-CM | POA: Diagnosis not present

## 2022-11-11 DIAGNOSIS — R040 Epistaxis: Secondary | ICD-10-CM | POA: Diagnosis not present

## 2022-11-11 DIAGNOSIS — R0602 Shortness of breath: Secondary | ICD-10-CM | POA: Diagnosis not present

## 2022-11-11 DIAGNOSIS — R609 Edema, unspecified: Secondary | ICD-10-CM | POA: Diagnosis not present

## 2022-11-14 ENCOUNTER — Encounter: Payer: Medicare HMO | Admitting: Family

## 2022-11-14 DIAGNOSIS — R040 Epistaxis: Secondary | ICD-10-CM | POA: Diagnosis not present

## 2022-11-17 ENCOUNTER — Inpatient Hospital Stay: Payer: Medicare HMO | Attending: Oncology

## 2022-11-17 VITALS — BP 126/70 | HR 85 | Temp 98.0°F | Resp 18

## 2022-11-17 DIAGNOSIS — D500 Iron deficiency anemia secondary to blood loss (chronic): Secondary | ICD-10-CM

## 2022-11-17 DIAGNOSIS — D509 Iron deficiency anemia, unspecified: Secondary | ICD-10-CM | POA: Diagnosis not present

## 2022-11-17 MED ORDER — SODIUM CHLORIDE 0.9 % IV SOLN
200.0000 mg | Freq: Once | INTRAVENOUS | Status: AC
  Administered 2022-11-17: 200 mg via INTRAVENOUS
  Filled 2022-11-17: qty 200

## 2022-11-17 MED ORDER — SODIUM CHLORIDE 0.9 % IV SOLN
Freq: Once | INTRAVENOUS | Status: AC
  Filled 2022-11-17: qty 250

## 2022-11-17 NOTE — Patient Instructions (Signed)
Iron Sucrose Injection What is this medication? IRON SUCROSE (EYE ern SOO krose) treats low levels of iron (iron deficiency anemia) in people with kidney disease. Iron is a mineral that plays an important role in making red blood cells, which carry oxygen from your lungs to the rest of your body. This medicine may be used for other purposes; ask your health care provider or pharmacist if you have questions. COMMON BRAND NAME(S): Venofer What should I tell my care team before I take this medication? They need to know if you have any of these conditions: Anemia not caused by low iron levels Heart disease High levels of iron in the blood Kidney disease Liver disease An unusual or allergic reaction to iron, other medications, foods, dyes, or preservatives Pregnant or trying to get pregnant Breastfeeding How should I use this medication? This medication is for infusion into a vein. It is given in a hospital or clinic setting. Talk to your care team about the use of this medication in children. While this medication may be prescribed for children as young as 2 years for selected conditions, precautions do apply. Overdosage: If you think you have taken too much of this medicine contact a poison control center or emergency room at once. NOTE: This medicine is only for you. Do not share this medicine with others. What if I miss a dose? Keep appointments for follow-up doses. It is important not to miss your dose. Call your care team if you are unable to keep an appointment. What may interact with this medication? Do not take this medication with any of the following: Deferoxamine Dimercaprol Other iron products This medication may also interact with the following: Chloramphenicol Deferasirox This list may not describe all possible interactions. Give your health care provider a list of all the medicines, herbs, non-prescription drugs, or dietary supplements you use. Also tell them if you smoke,  drink alcohol, or use illegal drugs. Some items may interact with your medicine. What should I watch for while using this medication? Visit your care team regularly. Tell your care team if your symptoms do not start to get better or if they get worse. You may need blood work done while you are taking this medication. You may need to follow a special diet. Talk to your care team. Foods that contain iron include: whole grains/cereals, dried fruits, beans, or peas, leafy green vegetables, and organ meats (liver, kidney). What side effects may I notice from receiving this medication? Side effects that you should report to your care team as soon as possible: Allergic reactions--skin rash, itching, hives, swelling of the face, lips, tongue, or throat Low blood pressure--dizziness, feeling faint or lightheaded, blurry vision Shortness of breath Side effects that usually do not require medical attention (report to your care team if they continue or are bothersome): Flushing Headache Joint pain Muscle pain Nausea Pain, redness, or irritation at injection site This list may not describe all possible side effects. Call your doctor for medical advice about side effects. You may report side effects to FDA at 1-800-FDA-1088. Where should I keep my medication? This medication is given in a hospital or clinic and will not be stored at home. NOTE: This sheet is a summary. It may not cover all possible information. If you have questions about this medicine, talk to your doctor, pharmacist, or health care provider.  2023 Elsevier/Gold Standard (2021-01-14 00:00:00)

## 2022-11-17 NOTE — Progress Notes (Signed)
Patient ID: Theresa [name], female    DOB: 31-Aug-1956, 67 y.o.   MRN: [id number]  HPI  Theresa [name] is a 67 y/o female with a history of chronic low back pain, HTN, depression, COPD on long-term oxygen, anxiety, carpal tunnel syndrome, long-standing tobacco use and chronic heart failure.  Echo report from 04/29/22 reviewed and showed an EF of 60-65% along with mild MR and no thrombus. Echo report from 08/08/21 reviewed and showed an EF of 60-65% along with mild LAE. Echo report from 09/03/20 reviewed and showed an EF of >55% with mild LAE and severe TR. Echo report from 07/07/2019 reviewed and showed an EF of 60-65% along with mild TR and moderately elevated PA pressure. Echo done 11/03/15 but unable to access results. Echo done on 02/19/15 showed an EF of 50% without valvular stenosis.   Catheterization done 07/11/2019 showed:  Prox RCA to Dist RCA lesion is 50% stenosed. Mid LAD lesion is 50% stenosed. Ost LM to Dist LM lesion is 10% stenosed. Hemodynamic findings consistent with moderate pulmonary hypertension. Normal overall left ventricular function of at least 60% Moderate coronary disease no significant obstructive disease Moderate pulmonary hypertension  Admitted 11/07/22 due to epistaxis and COPD exacerbation. Admitted 11/05/22 due to epistaxis. Admitted 08/09/22 due to cough and worsening SOB due to COPD exacerbation due to RSV. Hypotension due to overdiuresis. Blood transfusion given due to hemoglobin <7 due to slow GIB. Initially need increased oxygen at 8L but able to be weaned down to baseline 4L. [place] negative for DVT. Discharged after 17 days. Admitted 07/18/22 due to abdominal pain and nausea. Cardiology consult obtained. Troponin elevation thought to be due to demand ischemia. Discharged after 3 days.   She presents today for a follow-up visit with a chief complaint of minimal shortness of breath with moderate exertion. Describes this as chronic in nature. Has associated fatigue, difficulty  sleeping, anxiety, chronic pain, light-headedness, slight weight gain and recurring nosebleeds along with this. Denies any cough, wheezing, chest pain, pedal edema, palpitations or abdominal distention.   Has had 2 recent admissions for recurring nosebleeds. Saw ENT earlier this week and nasal packing removed. Had a nosebleed yesterday but none today.   Past Medical History:  Diagnosis Date   Acute postoperative pain 10/03/2017   Anxiety    BiPAP (biphasic positive airway pressure) dependence    at hs   Carpal tunnel syndrome    CHF (congestive heart failure) ([place])    1/18   CHF (congestive heart failure) ([facility])    Chronic generalized abdominal pain    Chronic rhinitis    COPD (chronic obstructive pulmonary disease) ([facility])    DDD (degenerative disc disease), cervical    DDD (degenerative disc disease), lumbosacral    Depression    Dyspnea    Edema    Flu    1/18   Gastritis    GERD (gastroesophageal reflux disease)    Hematuria    Hernia of abdominal wall 07/17/2018   Hernia, abdominal    Hypertension    Kidney stones    Low back pain    Lumbar radiculitis    Malodorous urine    Muscle weakness    Obesity    Oxygen dependent    Pneumonia due to COVID-19 virus 02/03/2020   Renal cyst    Sensory urge incontinence    Thyroid activity decreased    9/19   Tobacco abuse    Wheezing    Past Surgical History:  Procedure Laterality Date  ABDOMINAL HYSTERECTOMY     CARDIOVERSION N/A 04/29/2022   Procedure: CARDIOVERSION;  Surgeon: Corey Skains, MD;  Location: [facility];  Service: Cardiovascular;  Laterality: N/A;   CARPAL TUNNEL RELEASE Left 2012   cataract surgery Bilateral    EPIGASTRIC HERNIA REPAIR N/A 08/13/2018   HERNIA REPAIR EPIGASTRIC ADULT; polypropylene mesh reinforcement.  Surgeon: Robert Bellow, MD;  Location: [facility];  Service: General;  Laterality: N/A;   ESOPHAGOGASTRODUODENOSCOPY N/A 06/21/2022   Procedure: ESOPHAGOGASTRODUODENOSCOPY (EGD);   Surgeon: Toledo, Benay Pike, MD;  Location: [facility];  Service: Gastroenterology;  Laterality: N/A;   RIGHT/LEFT HEART CATH AND CORONARY ANGIOGRAPHY N/A 07/11/2019   Procedure: RIGHT/LEFT HEART CATH AND CORONARY ANGIOGRAPHY;  Surgeon: Yolonda Kida, MD;  Location: [place] [facility];  Service: Cardiovascular;  Laterality: N/A;   TEE WITHOUT CARDIOVERSION N/A 04/29/2022   Procedure: TRANSESOPHAGEAL ECHOCARDIOGRAM (TEE);  Surgeon: Corey Skains, MD;  Location: [facility];  Service: Cardiovascular;  Laterality: N/A;   TONSILLECTOMY     TUBAL LIGATION     UMBILICAL HERNIA REPAIR N/A 08/13/2018   HERNIA REPAIR UMBILICAL ADULT; polypropylene mesh reinforcement Surgeon: Robert Bellow, MD;  Location: [facility];  Service: General;  Laterality: N/A;   Family History  Problem Relation Age of Onset   Heart disease Mother    Stroke Mother    Coronary artery disease Mother    Alcohol abuse Father    Heart disease Father    Lung cancer Sister    Cancer Sister    Breast cancer Sister    Cancer Brother    Cancer Brother    Kidney cancer Brother    Pneumonia Brother    Prostate cancer Neg Hx    Bladder Cancer Neg Hx    Social History   Tobacco Use   Smoking status: Former    Packs/day: 1.50    Years: 44.00    Total pack years: 66.00    Types: Cigarettes    Quit date: 05/24/2022    Years since quitting: 0.4   Smokeless tobacco: Never   Tobacco comments:    over a half pack  Substance Use Topics   Alcohol use: No   Allergies  Allergen Reactions   Gabapentin Other (See Comments)    Higher doses of 800 mg, 600 mg, 300 mg causes excessive sedation and contributes to numerous mechanical falls. (Unsure if 100 mg dose can be tolerated)   Codeine Nausea And Vomiting   Meloxicam Other (See Comments)    Stomach pain   Prior to Admission medications   Medication Sig Start Date End Date Taking? Authorizing Provider  acetaminophen (TYLENOL) 500 MG tablet Take 1,000 mg by mouth  every 8 (eight) hours as needed for mild pain or headache.   Yes [provider]  albuterol (VENTOLIN HFA) 108 (90 Base) MCG/ACT inhaler Inhale 2 puffs into the lungs every 6 (six) hours as needed for wheezing or shortness of breath. 10/23/19  Yes Veronese, [place], MD  amiodarone (PACERONE) 200 MG tablet Take 1 tablet (200 mg total) by mouth daily. 04/30/22  Yes Sharen Hones, MD  butalbital-acetaminophen-caffeine (FIORICET) [phone] MG tablet Take 1 tablet by mouth every 6 (six) hours as needed for headache. LIMIT USE TO PREVENT MEDICATION OVERUSE HEADACHE 04/09/22  Yes Emeterio Reeve, DO  Cholecalciferol 50 MCG (2000 UT) CAPS Take 2,000 Units by mouth daily.    Yes [provider]  dapagliflozin propanediol (FARXIGA) 10 MG TABS tablet Take 1 tablet (10 mg total) by mouth  daily before breakfast. 08/31/22  Yes Darylene Price A, FNP  diclofenac Sodium (VOLTAREN) 1 % GEL Apply 2 g topically 4 (four) times daily as needed (lower back and/or shoulder pain).   Yes [provider]  diltiazem (CARDIZEM CD) 180 MG 24 hr capsule Take 180 mg by mouth daily.   Yes [provider]  ferrous sulfate 325 (65 FE) MG EC tablet Take 1 tablet (325 mg total) by mouth 2 (two) times daily with a meal. Patient taking differently: Take 325 mg by mouth daily with breakfast. 11/09/22  Yes Lorella Nimrod, MD  Fluticasone-Umeclidin-Vilant 100-62.5-25 MCG/INH AEPB Inhale 1 puff into the lungs daily.   Yes [provider]  FUROSCIX 80 MG/10ML CTKT Inject into the skin. 09/02/22  Yes [provider]  HYDROcodone-acetaminophen (NORCO/VICODIN) 5-325 MG tablet Take 1 tablet by mouth every 8 (eight) hours as needed for severe pain. Must last 30 days 10/31/22 11/30/22 Yes Milinda Pointer, MD  HYDROcodone-acetaminophen (NORCO/VICODIN) 5-325 MG tablet Take 1 tablet by mouth every 8 (eight) hours as needed for severe pain. Must last 30 days 11/30/22 12/30/22 Yes Milinda Pointer, MD   HYDROcodone-acetaminophen (NORCO/VICODIN) 5-325 MG tablet Take 1 tablet by mouth every 8 (eight) hours as needed for severe pain. Must last 30 days 12/30/22 01/29/23 Yes Milinda Pointer, MD  ipratropium-albuterol (DUONEB) 0.5-2.5 (3) MG/3ML SOLN USE 1 VIAL VIA NEBULIZER EVERY 6 HOURS AS NEEDED FOR SHORTNESS OF BREATH OR WHEEZING 11/30/20  Yes Chrismon, Vickki Muff, PA-C  IRON SUCROSE IV Inject 200 mg into the vein once a week.   Yes [provider]  melatonin 5 MG TABS Take 5 mg by mouth at bedtime.   Yes [provider]  naloxone (NARCAN) nasal spray 4 mg/0.1 mL Place 1 spray into the nose as needed for up to 365 doses (for opioid-induced respiratory depresssion). In case of emergency (overdose), spray once into each nostril. If no response within 3 minutes, repeat application and call [phone]. 08/03/22 08/03/23 Yes Milinda Pointer, MD  omeprazole (PRILOSEC) 40 MG capsule Take 40 mg by mouth daily. 11/01/22 11/01/23 Yes [provider]  polyethylene glycol (MIRALAX / GLYCOLAX) 17 g packet Take 17 g by mouth daily as needed for mild constipation or moderate constipation.   Yes [provider]  potassium chloride SA (KLOR-CON M) 20 MEQ tablet Take 2 tablets every morning and 1 tablet every evening 06/22/22  Yes Journei Thomassen A, FNP  roflumilast (DALIRESP) 500 MCG TABS tablet Take 500 mcg by mouth daily.   Yes [provider]  sertraline (ZOLOFT) 100 MG tablet TAKE 2 TABLETS BY MOUTH ONCE EVERY MORNING 10/03/22  Yes Birdie Sons, MD  simvastatin (ZOCOR) 20 MG tablet Take 20 mg by mouth at bedtime.   Yes [provider]  sodium chloride (OCEAN) 0.65 % SOLN nasal spray Place 1 spray into both nostrils as needed (bloody nose).   Yes [provider]  spironolactone (ALDACTONE) 25 MG tablet Take 25 mg by mouth daily.   Yes [provider]  torsemide (DEMADEX) 20 MG tablet Take 40-80 mg by mouth See admin instructions. Take 4 tablets ('80mg'$ )  by mouth every morning and take 2 tablets ('40mg'$ ) by mouth every afternoon   Yes [provider]  varenicline (CHANTIX) 1 MG tablet Take 1 mg by mouth 2 (two) times daily.   Yes [provider]  busPIRone (BUSPAR) 5 MG tablet TAKE 1 TABLET BY MOUTH TWICE DAILY Patient not taking: Reported on 11/18/2022 09/03/22   Caryn Section,  Kirstie Peri, MD  nicotine (NICODERM CQ - DOSED IN MG/24 HOURS) 14 mg/24hr patch Place 1 patch (14 mg total) onto the skin daily as needed (nicotine craving). Patient not taking: Reported on 11/18/2022 11/09/22   Lorella Nimrod, MD   Review of Systems  Constitutional:  Positive for fatigue. Negative for appetite change.  HENT:  Positive for nosebleeds (last nosebleed yesterday).   Eyes: Negative.   Respiratory:  Positive for shortness of breath (improving). Negative for cough and wheezing.   Cardiovascular:  Negative for chest pain, palpitations and leg swelling.  Gastrointestinal:  Negative for abdominal distention, abdominal pain and nausea.  Endocrine: Negative.   Genitourinary: Negative.   Musculoskeletal:  Positive for arthralgias (right shoulder) and back pain.  Skin: Negative.   Allergic/Immunologic: Negative.   Neurological:  Positive for light-headedness and headaches. Negative for dizziness.  Hematological:  Negative for adenopathy. Bruises/bleeds easily.  Psychiatric/Behavioral:  Positive for sleep disturbance (wearing oxygen & bipap at night). Negative for agitation, dysphoric mood and suicidal ideas. The patient is nervous/anxious.    Vitals:   11/18/22 1401  BP: 135/60  Pulse: 92  Resp: 20  SpO2: 92%  Weight: 213 lb (96.6 kg)   Wt Readings from Last 3 Encounters:  11/18/22 213 lb (96.6 kg)  11/08/22 218 lb 4.1 oz (99 kg)  11/05/22 205 lb (93 kg)   Lab Results  Component Value Date   CREATININE 0.71 11/08/2022   CREATININE 0.84 11/07/2022   CREATININE 0.71 11/06/2022   Physical Exam Vitals and nursing note reviewed.  Constitutional:       Appearance: She is well-developed.  HENT:     Head: Normocephalic and atraumatic.  Neck:     Vascular: No JVD.  Cardiovascular:     Rate and Rhythm: Normal rate and regular rhythm.  Pulmonary:     Effort: Pulmonary effort is normal.     Breath sounds: No wheezing, rhonchi or rales.  Abdominal:     General: There is no distension.     Palpations: Abdomen is soft.     Tenderness: There is no abdominal tenderness.  Musculoskeletal:        General: No tenderness.     Cervical back: Normal range of motion and neck supple.     Right lower leg: No edema.     Left lower leg: No edema.  Skin:    General: Skin is warm and dry.  Neurological:     General: No focal deficit present.     Mental Status: She is alert and oriented to person, place, and time.  Psychiatric:        Mood and Affect: Mood normal. Mood is not depressed.        Behavior: Behavior normal.        Thought Content: Thought content normal.   Assessment & Plan:  1: Chronic heart failure with preserved ejection fraction without structural changes- - NYHA class II - euvolemic today - weighing daily; reminded to call for an overnight weight gain of >2 pounds or a weekly weight gain of >5 pounds - weight up 4 pounds from last visit here 2 months ago - discussed entresto but she's hesitant due to history of hypotension; BP log provided today and she will check her BP daily, write it down and bring BP log to her next appt - pharm checked and entresto would be $0 copay - saw [facility] provider (Bensimhon) 09/29/22 - wearing compression socks daily  - BNP 08/31/22 was 36.4  2:  HTN- - BP 135/60 - saw PCP (Bronstein) 09/14/22 - BMP from 11/08/22 reviewed and showed sodium 139, potassium 3.9, creatinine 0.71 and GFR >60  3: COPD- - wearing bipap nightly - oxygen at 6L at rest, 8L on exertion and 10L when showering - saw pulmonology Raul Del) 10/03/22  4: Tobacco use- - smoking 1 pack / week - removes herself from the oxygen  when smoking  5: Atrial flutter- - previous cardioversion done - saw cardiologist Clayborn Bigness) 11/11/22   Medication list reviewed.   Return in 3 weeks, sooner if needed.

## 2022-11-18 ENCOUNTER — Encounter: Payer: Self-pay | Admitting: Family

## 2022-11-18 ENCOUNTER — Ambulatory Visit: Payer: Medicare HMO | Attending: Family | Admitting: Family

## 2022-11-18 ENCOUNTER — Other Ambulatory Visit (HOSPITAL_COMMUNITY): Payer: Self-pay

## 2022-11-18 ENCOUNTER — Encounter: Payer: Self-pay | Admitting: Pharmacy Technician

## 2022-11-18 VITALS — BP 135/60 | HR 92 | Resp 20 | Wt 213.0 lb

## 2022-11-18 DIAGNOSIS — R69 Illness, unspecified: Secondary | ICD-10-CM | POA: Diagnosis not present

## 2022-11-18 DIAGNOSIS — I10 Essential (primary) hypertension: Secondary | ICD-10-CM

## 2022-11-18 DIAGNOSIS — J449 Chronic obstructive pulmonary disease, unspecified: Secondary | ICD-10-CM

## 2022-11-18 DIAGNOSIS — F32A Depression, unspecified: Secondary | ICD-10-CM | POA: Diagnosis not present

## 2022-11-18 DIAGNOSIS — G8929 Other chronic pain: Secondary | ICD-10-CM | POA: Diagnosis not present

## 2022-11-18 DIAGNOSIS — I4892 Unspecified atrial flutter: Secondary | ICD-10-CM | POA: Diagnosis not present

## 2022-11-18 DIAGNOSIS — F17210 Nicotine dependence, cigarettes, uncomplicated: Secondary | ICD-10-CM | POA: Insufficient documentation

## 2022-11-18 DIAGNOSIS — J441 Chronic obstructive pulmonary disease with (acute) exacerbation: Secondary | ICD-10-CM | POA: Insufficient documentation

## 2022-11-18 DIAGNOSIS — I110 Hypertensive heart disease with heart failure: Secondary | ICD-10-CM | POA: Diagnosis not present

## 2022-11-18 DIAGNOSIS — F419 Anxiety disorder, unspecified: Secondary | ICD-10-CM | POA: Diagnosis not present

## 2022-11-18 DIAGNOSIS — I5032 Chronic diastolic (congestive) heart failure: Secondary | ICD-10-CM

## 2022-11-18 DIAGNOSIS — Z720 Tobacco use: Secondary | ICD-10-CM | POA: Diagnosis not present

## 2022-11-18 NOTE — Patient Instructions (Signed)
Continue weighing daily and call for an overnight weight gain of 3 pounds or more or a weekly weight gain of more than 5 pounds.   If you have voicemail, please make sure your mailbox is cleaned out so that we may leave a message and please make sure to listen to any voicemails.    If you receive a satisfaction survey regarding the [facility], please take the time to fill it out. This way we can continue to provide excellent care and make any changes that need to be made.   

## 2022-11-18 NOTE — Progress Notes (Signed)
[facility] - PHARMACIST COUNSELING NOTE  Guideline-Directed Medical Therapy/Evidence Based Medicine  ACE/ARB/ARNI:  None Beta Blocker:  None Aldosterone Antagonist: Spironolactone 25 mg daily Diuretic: Torsemide 80 mg every morning and 40 mg every afternoon SGLT2i: Dapagliflozin 10 mg daily  Adherence Assessment  Do you ever forget to take your medication? '[]'$ Yes '[x]'$ No  Do you ever skip doses due to side effects? '[]'$ Yes '[x]'$ No  Do you have trouble affording your medicines? '[]'$ Yes '[x]'$ No  Are you ever unable to pick up your medication due to transportation difficulties? '[]'$ Yes '[x]'$ No  Do you ever stop taking your medications because you don't believe they are helping? '[]'$ Yes '[x]'$ No  Do you check your weight daily? '[x]'$ Yes '[]'$ No   Adherence strategy: Pill box, and husband reminds her if she misses a dose  Barriers to obtaining medications: None  Vital signs: HR 92, BP 135/60, weight (pounds) 213 ECHO (TEE): Date 04/2022, EF 60-65%, notes Mild LAE and severe TR     Latest Ref Rng & Units 11/08/2022    4:39 AM 11/07/2022   11:37 AM 11/06/2022    4:44 AM  BMP  Glucose 70 - 99 mg/dL 126  98  141   BUN 8 - 23 mg/dL '12  13  14   '$ Creatinine 0.44 - 1.00 mg/dL 0.71  0.84  0.71   Sodium 135 - 145 mmol/L 139  143  137   Potassium 3.5 - 5.1 mmol/L 3.9  3.4  3.7   Chloride 98 - 111 mmol/L 102  103  103   CO2 22 - 32 mmol/L '31  30  27   '$ Calcium 8.9 - 10.3 mg/dL 8.6  8.9  8.1     Past Medical History:  Diagnosis Date   Acute postoperative pain 10/03/2017   Anxiety    BiPAP (biphasic positive airway pressure) dependence    at hs   Carpal tunnel syndrome    CHF (congestive heart failure) ([place])    1/18   CHF (congestive heart failure) ([facility])    Chronic generalized abdominal pain    Chronic rhinitis    COPD (chronic obstructive pulmonary disease) ([facility])    DDD (degenerative disc disease), cervical    DDD (degenerative disc disease), lumbosacral     Depression    Dyspnea    Edema    Flu    1/18   Gastritis    GERD (gastroesophageal reflux disease)    Hematuria    Hernia of abdominal wall 07/17/2018   Hernia, abdominal    Hypertension    Kidney stones    Low back pain    Lumbar radiculitis    Malodorous urine    Muscle weakness    Obesity    Oxygen dependent    Pneumonia due to COVID-19 virus 02/03/2020   Renal cyst    Sensory urge incontinence    Thyroid activity decreased    9/19   Tobacco abuse    Wheezing     ASSESSMENT 67 year old female with PMH HTN, pulmHTN, Aflutter/Afib w/RVR (not on anticoag), CAD, HLD, chronic pain who presents to the HF clinic for follow-up. Recent TEE in 04/2022 shows EF 60-65%. Regarding GDMT, patient takes Farxiga 10 mg daily and spironolactone 25 mg daily. She also takes torsemide 80 mg every morning and 40 mg every afternoon. Patient has been experiencing recent nosebleeds, and this has occurred after Xarelto held. Nose packing only temporarily effective. Currently managing with saline nasal gel PRN for epistaxis. Blood  pressure is above goal in office today but patient monitors daily and endorses that her home SBPs run in the 100s-110s. She reports that she used to have high blood pressure but that is no longer a problem for her and they discontinued blood pressure medications in the past, but she does not recall what they are. I provided patient with blood pressure and weight log and asked her to track her home BP and bring it with her to next appointment so we could corroborate home blood pressures. Given female and h/o Afib, would like to start Delene Loll because it is also a $0 copay for her but reported home BPs limit [place] at this time.  Recent ED Visit (past 6 months): Date - 11/07/2022, CC - Epistaxis Date - 11/05/2022, CC - Epistaxis Date - 08/10/2022, CC - COPD Date - 08/09/2022, CC - COPD Date - 07/19/2022, CC - GI bleed Date - 07/18/2022, CC - Dyspnea Date - 06/26/2022, CC - Dysphagia Date  - 06/19/2022, CC - GI bleed Date - 04/23/2022, CC - New onset atrial flutter  PLAN  CHF/HTN Recommend consideration of Entresto in the future if BP allows She will bring home BP log to next appointment so we can see how it runs at home (BP elevated today in clinic) Compelling indications for Entresto in HFpEF: female, h/o Afib Continue Farxiga, spironolactone, and torsemide  Afib CHADSVASc 5 Holding Xarelto due to GI bleed and epistaxis Continue diltiazem and amiodarone  CAD/HLD 03/2022 LDL 86 Continue simvastatin   Time spent: 15 minutes  Will M. Ouida Sills, PharmD PGY-1 Pharmacy Resident 11/18/2022 2:51 PM   Current Outpatient Medications:    acetaminophen (TYLENOL) 500 MG tablet, Take 1,000 mg by mouth every 8 (eight) hours as needed for mild pain or headache., Disp: , Rfl:    albuterol (VENTOLIN HFA) 108 (90 Base) MCG/ACT inhaler, Inhale 2 puffs into the lungs every 6 (six) hours as needed for wheezing or shortness of breath., Disp: 8 g, Rfl: 1   amiodarone (PACERONE) 200 MG tablet, Take 1 tablet (200 mg total) by mouth daily., Disp: 30 tablet, Rfl: 0   busPIRone (BUSPAR) 5 MG tablet, TAKE 1 TABLET BY MOUTH TWICE DAILY (Patient not taking: Reported on 11/18/2022), Disp: 180 tablet, Rfl: 1   butalbital-acetaminophen-caffeine (FIORICET) 50-325-40 MG tablet, Take 1 tablet by mouth every 6 (six) hours as needed for headache. LIMIT USE TO PREVENT MEDICATION OVERUSE HEADACHE, Disp: 14 tablet, Rfl: 0   Cholecalciferol 50 MCG (2000 UT) CAPS, Take 2,000 Units by mouth daily. , Disp: , Rfl:    dapagliflozin propanediol (FARXIGA) 10 MG TABS tablet, Take 1 tablet (10 mg total) by mouth daily before breakfast., Disp: 30 tablet, Rfl: 5   diclofenac Sodium (VOLTAREN) 1 % GEL, Apply 2 g topically 4 (four) times daily as needed (lower back and/or shoulder pain)., Disp: , Rfl:    diltiazem (CARDIZEM CD) 180 MG 24 hr capsule, Take 180 mg by mouth daily., Disp: , Rfl:    ferrous sulfate 325 (65 FE) MG EC  tablet, Take 1 tablet (325 mg total) by mouth 2 (two) times daily with a meal. (Patient taking differently: Take 325 mg by mouth daily with breakfast.), Disp: 60 tablet, Rfl: 3   Fluticasone-Umeclidin-Vilant 100-62.5-25 MCG/INH AEPB, Inhale 1 puff into the lungs daily., Disp: , Rfl:    FUROSCIX 80 MG/10ML CTKT, Inject into the skin., Disp: , Rfl:    HYDROcodone-acetaminophen (NORCO/VICODIN) 5-325 MG tablet, Take 1 tablet by mouth every 8 (eight) hours as  needed for severe pain. Must last 30 days, Disp: 90 tablet, Rfl: 0   [START ON 11/30/2022] HYDROcodone-acetaminophen (NORCO/VICODIN) 5-325 MG tablet, Take 1 tablet by mouth every 8 (eight) hours as needed for severe pain. Must last 30 days, Disp: 90 tablet, Rfl: 0   [START ON 12/30/2022] HYDROcodone-acetaminophen (NORCO/VICODIN) 5-325 MG tablet, Take 1 tablet by mouth every 8 (eight) hours as needed for severe pain. Must last 30 days, Disp: 90 tablet, Rfl: 0   ipratropium-albuterol (DUONEB) 0.5-2.5 (3) MG/3ML SOLN, USE 1 VIAL VIA NEBULIZER EVERY 6 HOURS AS NEEDED FOR SHORTNESS OF BREATH OR WHEEZING, Disp: 1080 mL, Rfl: 0   IRON SUCROSE IV, Inject 200 mg into the vein once a week., Disp: , Rfl:    melatonin 5 MG TABS, Take 5 mg by mouth at bedtime., Disp: , Rfl:    naloxone (NARCAN) nasal spray 4 mg/0.1 mL, Place 1 spray into the nose as needed for up to 365 doses (for opioid-induced respiratory depresssion). In case of emergency (overdose), spray once into each nostril. If no response within 3 minutes, repeat application and call [phone]., Disp: 1 each, Rfl: 0   nicotine (NICODERM CQ - DOSED IN MG/24 HOURS) 14 mg/24hr patch, Place 1 patch (14 mg total) onto the skin daily as needed (nicotine craving). (Patient not taking: Reported on 11/18/2022), Disp: 28 patch, Rfl: 0   omeprazole (PRILOSEC) 40 MG capsule, Take 40 mg by mouth daily., Disp: , Rfl:    polyethylene glycol (MIRALAX / GLYCOLAX) 17 g packet, Take 17 g by mouth daily as needed for mild constipation or  moderate constipation., Disp: , Rfl:    potassium chloride SA (KLOR-CON M) 20 MEQ tablet, Take 2 tablets every morning and 1 tablet every evening, Disp: 270 tablet, Rfl: 3   roflumilast (DALIRESP) 500 MCG TABS tablet, Take 500 mcg by mouth daily., Disp: , Rfl:    sertraline (ZOLOFT) 100 MG tablet, TAKE 2 TABLETS BY MOUTH ONCE EVERY MORNING, Disp: 180 tablet, Rfl: 0   simvastatin (ZOCOR) 20 MG tablet, Take 20 mg by mouth at bedtime., Disp: , Rfl:    sodium chloride (OCEAN) 0.65 % SOLN nasal spray, Place 1 spray into both nostrils as needed (bloody nose)., Disp: , Rfl:    spironolactone (ALDACTONE) 25 MG tablet, Take 25 mg by mouth daily., Disp: , Rfl:    torsemide (DEMADEX) 20 MG tablet, Take 40-80 mg by mouth See admin instructions. Take 4 tablets ('80mg'$ ) by mouth every morning and take 2 tablets ('40mg'$ ) by mouth every afternoon, Disp: , Rfl:    varenicline (CHANTIX) 1 MG tablet, Take 1 mg by mouth 2 (two) times daily., Disp: , Rfl:     DRUGS TO CAUTION IN HEART FAILURE  Drug or Class Mechanism  Analgesics NSAIDs COX-2 inhibitors Glucocorticoids  Sodium and water retention, increased systemic vascular resistance, decreased response to diuretics   Diabetes Medications Metformin Thiazolidinediones Rosiglitazone (Avandia) Pioglitazone (Actos) DPP4 Inhibitors Saxagliptin (Onglyza) Sitagliptin (Januvia)   Lactic acidosis Possible calcium channel blockade   Unknown  Antiarrhythmics Class I  Flecainide Disopyramide Class III Sotalol Other Dronedarone  Negative inotrope, proarrhythmic   Proarrhythmic, beta blockade  Negative inotrope  Antihypertensives Alpha Blockers Doxazosin Calcium Channel Blockers Diltiazem Verapamil Nifedipine Central Alpha Adrenergics Moxonidine Peripheral Vasodilators Minoxidil  Increases renin and aldosterone  Negative inotrope    Possible sympathetic withdrawal  Unknown  Anti-infective Itraconazole Amphotericin B  Negative  inotrope Unknown  Hematologic Anagrelide Cilostazol   Possible inhibition of PD IV Inhibition of PD III  causing arrhythmias  Neurologic/Psychiatric Stimulants Anti-Seizure Drugs Carbamazepine Pregabalin Antidepressants Tricyclics Citalopram Parkinsons Bromocriptine Pergolide Pramipexole Antipsychotics Clozapine Antimigraine Ergotamine Methysergide Appetite suppressants Bipolar Lithium  Peripheral alpha and beta agonist activity  Negative inotrope and chronotrope Calcium channel blockade  Negative inotrope, proarrhythmic Dose-dependent QT prolongation  Excessive serotonin activity/valvular damage Excessive serotonin activity/valvular damage Unknown  IgE mediated hypersensitivy, calcium channel blockade  Excessive serotonin activity/valvular damage Excessive serotonin activity/valvular damage Valvular damage  Direct myofibrillar degeneration, adrenergic stimulation  Antimalarials Chloroquine Hydroxychloroquine Intracellular inhibition of lysosomal enzymes  Urologic Agents Alpha Blockers Doxazosin Prazosin Tamsulosin Terazosin  Increased renin and aldosterone  Adapted from Page Carleene Overlie, et al. "Drugs That May Cause or Exacerbate Heart Failure: A Scientific Statement from the American Heart  Association." Circulation 2016; 134:e32-e69. DOI: 10.1161/CIR.0000000000000426   MEDICATION ADHERENCES TIPS AND STRATEGIES Taking medication as prescribed improves patient outcomes in heart failure (reduces hospitalizations, improves symptoms, increases survival) Side effects of medications can be managed by decreasing doses, switching agents, stopping drugs, or adding additional therapy. Please let someone in the [facility] know if you have having bothersome side effects so we can modify your regimen. Do not alter your medication regimen without talking to [place].  Medication reminders can help patients remember to take drugs on time. If you are missing or forgetting  doses you can try linking behaviors, using pill boxes, or an electronic reminder like an alarm on your phone or an app. Some people can also get automated phone calls as medication reminders.

## 2022-11-22 DIAGNOSIS — J9610 Chronic respiratory failure, unspecified whether with hypoxia or hypercapnia: Secondary | ICD-10-CM | POA: Diagnosis not present

## 2022-11-25 NOTE — Progress Notes (Unsigned)
PROVIDER NOTE: Interpretation of information contained herein should be left to medically-trained personnel. Specific patient instructions are provided elsewhere under "Patient Instructions" section of medical record. This document was created in part using STT-dictation technology, any transcriptional errors that may result from this process are unintentional.  Patient: Theresa [name] Type: Established DOB: [birth year] MRN: [id number] PCP: Juluis Pitch, MD  Service: Procedure DOS: 12/01/2022 Setting: Ambulatory Location: Ambulatory outpatient facility Delivery: Face-to-face Provider: Gaspar Cola, MD Specialty: Interventional Pain Management Specialty designation: 09 Location: Outpatient facility Ref. Prov.: Juluis Pitch, MD       Interventional Therapy   Procedure: Lumbar Facet, Medial Branch Radiofrequency Ablation (RFA) #2  Laterality: Right (-RT)  Level: L3, L4, L5, and S1 Medial Branch Level(s). These levels will denervate the L4-5 and L5-S1 lumbar facet joints.  Imaging: Fluoroscopy-guided         Anesthesia: Local anesthesia (1-2% Lidocaine) Anxiolysis: IV Versed         Sedation:                         DOS: 12/01/2022  Performed by: Gaspar Cola, MD  Purpose: Therapeutic/Palliative Indications: Low back pain severe enough to impact quality of life or function. Indications: No diagnosis found. [name] has been dealing with the above chronic pain for longer than three months and has either failed to respond, was unable to tolerate, or simply did not get enough benefit from other more conservative therapies including, but not limited to: 1. Over-the-counter medications 2. Anti-inflammatory medications 3. Muscle relaxants 4. Membrane stabilizers 5. Opioids 6. Physical therapy and/or chiropractic manipulation 7. Modalities (Heat, ice, etc.) 8. Invasive techniques such as nerve blocks. [name] has attained more than 50% relief of the pain from a  series of diagnostic injections conducted in separate occasions.  Pain Score: Pre-procedure:  /10 Post-procedure:  /10     Position / Prep / Materials:  Position: Prone  Prep solution: DuraPrep (Iodine Povacrylex [0.7% available iodine] and Isopropyl Alcohol, 74% w/w) Prep Area: Entire Lumbosacral Region (Lower back from mid-thoracic region to end of tailbone and from flank to flank.) Materials:  Tray: RFA (Radiofrequency) tray Needle(s):  Type: RFA (Teflon-coated radiofrequency ablation needles) Gauge (G):    ***     Length:    ***    Qty:    ***      Pre-op H&P Assessment:  [name] is a 67 y.o. (year old), female patient, seen today for interventional treatment. She  has a past surgical history that includes Abdominal hysterectomy; Tonsillectomy; Carpal tunnel release (Left, 2012); Tubal ligation; epigastric hernia repair (N/A, 08/13/2018); Umbilical hernia repair (N/A, 08/13/2018); RIGHT/LEFT HEART CATH AND CORONARY ANGIOGRAPHY (N/A, 07/11/2019); TEE without cardioversion (N/A, 04/29/2022); Cardioversion (N/A, 04/29/2022); Esophagogastroduodenoscopy (N/A, 06/21/2022); and cataract surgery (Bilateral). [name] has a current medication list which includes the following prescription(s): acetaminophen, albuterol, amiodarone, buspirone, butalbital-acetaminophen-caffeine, cholecalciferol, dapagliflozin propanediol, diclofenac sodium, diltiazem, ferrous sulfate, fluticasone-umeclidin-vilant, furoscix, hydrocodone-acetaminophen, [START ON 11/30/2022] hydrocodone-acetaminophen, [START ON 12/30/2022] hydrocodone-acetaminophen, ipratropium-albuterol, iron sucrose, melatonin, naloxone, nicotine, omeprazole, polyethylene glycol, potassium chloride sa, roflumilast, sertraline, simvastatin, sodium chloride, spironolactone, torsemide, and varenicline. Her primarily concern today is the No chief complaint on file.  Initial Vital Signs:  Pulse/HCG Rate:    Temp:   Resp:   BP:   SpO2:    BMI:  Estimated body mass index is 38.96 kg/m as calculated from the following:   Height as of 11/08/22: 5' 2"$  (1.575 m).   Weight as  of 11/18/22: 213 lb (96.6 kg).  Risk Assessment: Allergies: Reviewed. She is allergic to gabapentin, codeine, and meloxicam.  Allergy Precautions: None required Coagulopathies: Reviewed. None identified.  Blood-thinner therapy: None at this time Active Infection(s): Reviewed. None identified. Ms. Proa is afebrile  Site Confirmation: Ms. Hegland was asked to confirm the procedure and laterality before marking the site Procedure checklist: Completed Consent: Before the procedure and under the influence of no sedative(s), amnesic(s), or anxiolytics, the patient was informed of the treatment options, risks and possible complications. To fulfill our ethical and legal obligations, as recommended by the American Medical Association's Code of Ethics, I have informed the patient of my clinical impression; the nature and purpose of the treatment or procedure; the risks, benefits, and possible complications of the intervention; the alternatives, including doing nothing; the risk(s) and benefit(s) of the alternative treatment(s) or procedure(s); and the risk(s) and benefit(s) of doing nothing. The patient was provided information about the general risks and possible complications associated with the procedure. These may include, but are not limited to: failure to achieve desired goals, infection, bleeding, organ or nerve damage, allergic reactions, paralysis, and death. In addition, the patient was informed of those risks and complications associated to Spine-related procedures, such as failure to decrease pain; infection (i.e.: Meningitis, epidural or intraspinal abscess); bleeding (i.e.: epidural hematoma, subarachnoid hemorrhage, or any other type of intraspinal or peri-dural bleeding); organ or nerve damage (i.e.: Any type of peripheral nerve, nerve root, or spinal cord injury) with  subsequent damage to sensory, motor, and/or autonomic systems, resulting in permanent pain, numbness, and/or weakness of one or several areas of the body; allergic reactions; (i.e.: anaphylactic reaction); and/or death. Furthermore, the patient was informed of those risks and complications associated with the medications. These include, but are not limited to: allergic reactions (i.e.: anaphylactic or anaphylactoid reaction(s)); adrenal axis suppression; blood sugar elevation that in diabetics may result in ketoacidosis or comma; water retention that in patients with history of congestive heart failure may result in shortness of breath, pulmonary edema, and decompensation with resultant heart failure; weight gain; swelling or edema; medication-induced neural toxicity; particulate matter embolism and blood vessel occlusion with resultant organ, and/or nervous system infarction; and/or aseptic necrosis of one or more joints. Finally, the patient was informed that Medicine is not an exact science; therefore, there is also the possibility of unforeseen or unpredictable risks and/or possible complications that may result in a catastrophic outcome. The patient indicated having understood very clearly. We have given the patient no guarantees and we have made no promises. Enough time was given to the patient to ask questions, all of which were answered to the patient's satisfaction. Ms. Rigler has indicated that she wanted to continue with the procedure. Attestation: I, the ordering provider, attest that I have discussed with the patient the benefits, risks, side-effects, alternatives, likelihood of achieving goals, and potential problems during recovery for the procedure that I have provided informed consent. Date  Time: {CHL [facility]-PAIN TIME CHOICES:[id number]}   Pre-Procedure Preparation:  Monitoring: As per clinic protocol. Respiration, ETCO2, SpO2, BP, heart rate and rhythm monitor placed and checked for adequate  function Safety Precautions: Patient was assessed for positional comfort and pressure points before starting the procedure. Time-out: I initiated and conducted the "Time-out" before starting the procedure, as per protocol. The patient was asked to participate by confirming the accuracy of the "Time Out" information. Verification of the correct person, site, and procedure were performed and confirmed by me, the nursing staff,  and the patient. "Time-out" conducted as per Joint Commission's Universal Protocol (UP.01.01.01). Time:    Description of Procedure:          Laterality: See above. Levels:  See above. Safety Precautions: Aspiration looking for blood return was conducted prior to all injections. At no point did we inject any substances, as a needle was being advanced. Before injecting, the patient was told to immediately notify me if she was experiencing any new onset of "ringing in the ears, or metallic taste in the mouth". No attempts were made at seeking any paresthesias. Safe injection practices and needle disposal techniques used. Medications properly checked for expiration dates. SDV (single dose vial) medications used. After the completion of the procedure, all disposable equipment used was discarded in the proper designated medical waste containers. Local Anesthesia: Protocol guidelines were followed. The patient was positioned over the fluoroscopy table. The area was prepped in the usual manner. The time-out was completed. The target area was identified using fluoroscopy. A 12-in long, straight, sterile hemostat was used with fluoroscopic guidance to locate the targets for each level blocked. Once located, the skin was marked with an approved surgical skin marker. Once all sites were marked, the skin (epidermis, dermis, and hypodermis), as well as deeper tissues (fat, connective tissue and muscle) were infiltrated with a small amount of a short-acting local anesthetic, loaded on a 10cc syringe  with a 25G, 1.5-in  Needle. An appropriate amount of time was allowed for local anesthetics to take effect before proceeding to the next step. Technical description of process:  Radiofrequency Ablation (RFA) L2 Medial Branch Nerve RFA: The target area for the L2 medial branch is at the junction of the postero-lateral aspect of the superior articular process and the superior, posterior, and medial edge of the transverse process of L3. Under fluoroscopic guidance, a Radiofrequency needle was inserted until contact was made with os over the superior postero-lateral aspect of the pedicular shadow (target area). Sensory and motor testing was conducted to properly adjust the position of the needle. Once satisfactory placement of the needle was achieved, the numbing solution was slowly injected after negative aspiration for blood. 2.0 mL of the nerve block solution was injected without difficulty or complication. After waiting for at least 3 minutes, the ablation was performed. Once completed, the needle was removed intact. L3 Medial Branch Nerve RFA: The target area for the L3 medial branch is at the junction of the postero-lateral aspect of the superior articular process and the superior, posterior, and medial edge of the transverse process of L4. Under fluoroscopic guidance, a Radiofrequency needle was inserted until contact was made with os over the superior postero-lateral aspect of the pedicular shadow (target area). Sensory and motor testing was conducted to properly adjust the position of the needle. Once satisfactory placement of the needle was achieved, the numbing solution was slowly injected after negative aspiration for blood. 2.0 mL of the nerve block solution was injected without difficulty or complication. After waiting for at least 3 minutes, the ablation was performed. Once completed, the needle was removed intact. L4 Medial Branch Nerve RFA: The target area for the L4 medial branch is at the  junction of the postero-lateral aspect of the superior articular process and the superior, posterior, and medial edge of the transverse process of L5. Under fluoroscopic guidance, a Radiofrequency needle was inserted until contact was made with os over the superior postero-lateral aspect of the pedicular shadow (target area). Sensory and motor testing was conducted to  properly adjust the position of the needle. Once satisfactory placement of the needle was achieved, the numbing solution was slowly injected after negative aspiration for blood. 2.0 mL of the nerve block solution was injected without difficulty or complication. After waiting for at least 3 minutes, the ablation was performed. Once completed, the needle was removed intact. L5 Medial Branch Nerve RFA: The target area for the L5 medial branch is at the junction of the postero-lateral aspect of the superior articular process of S1 and the superior, posterior, and medial edge of the sacral ala. Under fluoroscopic guidance, a Radiofrequency needle was inserted until contact was made with os over the superior postero-lateral aspect of the pedicular shadow (target area). Sensory and motor testing was conducted to properly adjust the position of the needle. Once satisfactory placement of the needle was achieved, the numbing solution was slowly injected after negative aspiration for blood. 2.0 mL of the nerve block solution was injected without difficulty or complication. After waiting for at least 3 minutes, the ablation was performed. Once completed, the needle was removed intact. S1 Medial Branch Nerve RFA: The target area for the S1 medial branch is located inferior to the junction of the S1 superior articular process and the L5 inferior articular process, posterior, inferior, and lateral to the 6 o'clock position of the L5-S1 facet joint, just superior to the S1 posterior foramen. Under fluoroscopic guidance, the Radiofrequency needle was advanced until  contact was made with os over the Target area. Sensory and motor testing was conducted to properly adjust the position of the needle. Once satisfactory placement of the needle was achieved, the numbing solution was slowly injected after negative aspiration for blood. 2.0 mL of the nerve block solution was injected without difficulty or complication. After waiting for at least 3 minutes, the ablation was performed. Once completed, the needle was removed intact. Radiofrequency lesioning (ablation):  Radiofrequency Generator: Medtronic AccurianTM AG 1000 RF Generator Sensory Stimulation Parameters: 50 Hz was used to locate & identify the nerve, making sure that the needle was positioned such that there was no sensory stimulation below 0.3 V or above 0.7 V. Motor Stimulation Parameters: 2 Hz was used to evaluate the motor component. Care was taken not to lesion any nerves that demonstrated motor stimulation of the lower extremities at an output of less than 2.5 times that of the sensory threshold, or a maximum of 2.0 V. Lesioning Technique Parameters: Standard Radiofrequency settings. (Not bipolar or pulsed.) Temperature Settings: 80 degrees C Lesioning time: 60 seconds Intra-operative Compliance: Compliant  Once the entire procedure was completed, the treated area was cleaned, making sure to leave some of the prepping solution back to take advantage of its long term bactericidal properties.    Illustration of the posterior view of the lumbar spine and the posterior neural structures. Laminae of L2 through S1 are labeled. DPRL5, dorsal primary ramus of L5; DPRS1, dorsal primary ramus of S1; DPR3, dorsal primary ramus of L3; FJ, facet (zygapophyseal) joint L3-L4; I, inferior articular process of L4; LB1, lateral branch of dorsal primary ramus of L1; IAB, inferior articular branches from L3 medial branch (supplies L4-L5 facet joint); IBP, intermediate branch plexus; MB3, medial branch of dorsal primary ramus  of L3; NR3, third lumbar nerve root; S, superior articular process of L5; SAB, superior articular branches from L4 (supplies L4-5 facet joint also); TP3, transverse process of L3.  There were no vitals filed for this visit.  Start Time:   hrs. End Time:  hrs.  Imaging Guidance (Spinal):          Type of Imaging Technique: Fluoroscopy Guidance (Spinal) Indication(s): Assistance in needle guidance and placement for procedures requiring needle placement in or near specific anatomical locations not easily accessible without such assistance. Exposure Time: Please see nurses notes. Contrast: None used. Fluoroscopic Guidance: I was personally present during the use of fluoroscopy. "Tunnel Vision Technique" used to obtain the best possible view of the target area. Parallax error corrected before commencing the procedure. "Direction-depth-direction" technique used to introduce the needle under continuous pulsed fluoroscopy. Once target was reached, antero-posterior, oblique, and lateral fluoroscopic projection used confirm needle placement in all planes. Images permanently stored in EMR. Interpretation: No contrast injected. I personally interpreted the imaging intraoperatively. Adequate needle placement confirmed in multiple planes. Permanent images saved into the patient's record.  Antibiotic Prophylaxis:   Anti-infectives (From admission, onward)    None      Indication(s): None identified  Post-operative Assessment:  Post-procedure Vital Signs:  Pulse/HCG Rate:    Temp:   Resp:   BP:   SpO2:    EBL: None  Complications: No immediate post-treatment complications observed by team, or reported by patient.  Note: The patient tolerated the entire procedure well. A repeat set of vitals were taken after the procedure and the patient was kept under observation following institutional policy, for this type of procedure. Post-procedural neurological assessment was performed, showing return to  baseline, prior to discharge. The patient was provided with post-procedure discharge instructions, including a section on how to identify potential problems. Should any problems arise concerning this procedure, the patient was given instructions to immediately contact us, at any time, without hesitation. In any case, we plan to contact the patient by telephone for a follow-up status report regarding this interventional procedure.  Comments:  No additional relevant information.  Plan of Care (POC)  Orders:  No orders of the defined types were placed in this encounter.  Chronic Opioid Analgesic:  Hydrocodone/APAP 5/325 one tablet every 8 hours (15 mg/day of hydrocodone) MME/day: 15 mg/day.   Medications ordered for procedure: No orders of the defined types were placed in this encounter.  Medications administered: [name] had no medications administered during this visit.  See the medical record for exact dosing, route, and time of administration.  Follow-up plan:   No follow-ups on file.       Interventional Therapies  Risk  Complexity Considerations:   NOTE: NO further Lumbar RFA until BMI <35.   Planned  Pending:   Therapeutic right (L4-5, L5-S1) lumbar facet RFA (L3, L4, L5, and S1 medial branch) #2 (last done on 10/03/2017) (pending clearance to stop Xarelto x 3 days)   Under consideration:   Possible left lumbar facet RFA #2 (last done on 2018)  Diagnostic bilateral cervical facet MBB #1  Palliative left L2-3 LESI #4    Completed:   Palliative right lumbar facet MBB x7 (09/20/2022) (100/100/100x5days/0)  Palliative left lumbar facet MBB x6 (11/11/2021) (100/100/0/0)  Therapeutic left L2-3 LESI x3 (09/23/2021) (80/80/80)  Diagnostic/palliative left L4 TFESI x1 (12/26/2017) (100/100/50/>50)  Diagnostic/palliative left L4-5 LESI x2 (07/30/2019) (100/100/0/0)  Diagnostic/palliative right L4 TFESI x1 (01/25/2018) (100/100/50/50)  Diagnostic/palliative right L4-5 LESI x1  (01/25/2018) (100/100/50/50)  Palliative left lumbar facet RFA x1 (06/27/2017) (0/0/100/>50)  Palliative right lumbar facet RFA x1 (10/03/2017) (100/100/75)    Therapeutic  Palliative (PRN) options:   Therapeutic lumbar facet MBB   Therapeutic left L2 LESI  Therapeutic lumbar facet RFA  Pharmacotherapy  Nonopioids transferred 08/03/2020: Gabapentin and baclofen         Recent Visits Date Type Provider Dept  10/26/22 Office Visit Milinda Pointer, MD [facility]  10/04/22 Office Visit Milinda Pointer, MD [facility]  09/20/22 Procedure visit Milinda Pointer, MD [facility]  Showing recent visits within past 90 days and meeting all other requirements Future Appointments Date Type Provider Dept  12/01/22 Appointment Milinda Pointer, [facility]  01/25/23 Appointment Milinda Pointer, MD [facility]  Showing future appointments within next 90 days and meeting all other requirements  Disposition: Discharge home  Discharge (Date  Time): 12/01/2022;   hrs.   Primary Care Physician: Juluis Pitch, MD Location: [facility] Note by: Gaspar Cola, MD Date: 12/01/2022; Time: 11:52 AM  Disclaimer:  Medicine is not an Chief Strategy Officer. The only guarantee in medicine is that nothing is guaranteed. It is important to note that the decision to proceed with this intervention was based on the information collected from the patient. The Data and conclusions were drawn from the patient's questionnaire, the interview, and the physical examination. Because the information was provided in large part by the patient, it cannot be guaranteed that it has not been purposely or unconsciously manipulated. Every effort has been made to obtain as much relevant data as possible for this evaluation. It is important to note that the conclusions that lead to this procedure are derived in large part from the  available data. Always take into account that the treatment will also be dependent on availability of resources and existing treatment guidelines, considered by other Pain Management Practitioners as being common knowledge and practice, at the time of the intervention. For Medico-Legal purposes, it is also important to point out that variation in procedural techniques and pharmacological choices are the acceptable norm. The indications, contraindications, technique, and results of the above procedure should only be interpreted and judged by a Board-Certified Interventional Pain Specialist with extensive familiarity and expertise in the same exact procedure and technique.

## 2022-11-28 DIAGNOSIS — R69 Illness, unspecified: Secondary | ICD-10-CM | POA: Diagnosis not present

## 2022-11-29 ENCOUNTER — Inpatient Hospital Stay: Payer: Medicare HMO

## 2022-11-29 VITALS — BP 133/67 | HR 87 | Temp 98.9°F | Resp 18

## 2022-11-29 DIAGNOSIS — D509 Iron deficiency anemia, unspecified: Secondary | ICD-10-CM | POA: Diagnosis not present

## 2022-11-29 DIAGNOSIS — D500 Iron deficiency anemia secondary to blood loss (chronic): Secondary | ICD-10-CM

## 2022-11-29 MED ORDER — SODIUM CHLORIDE 0.9 % IV SOLN
200.0000 mg | Freq: Once | INTRAVENOUS | Status: AC
  Administered 2022-11-29: 200 mg via INTRAVENOUS
  Filled 2022-11-29: qty 200

## 2022-11-29 MED ORDER — SODIUM CHLORIDE 0.9 % IV SOLN
Freq: Once | INTRAVENOUS | Status: AC
  Filled 2022-11-29: qty 250

## 2022-11-29 MED ORDER — SODIUM CHLORIDE 0.9 % IV SOLN
Freq: Once | INTRAVENOUS | Status: DC | PRN
  Filled 2022-11-29: qty 250

## 2022-11-29 MED ORDER — DIPHENHYDRAMINE HCL 50 MG/ML IJ SOLN
50.0000 mg | Freq: Once | INTRAMUSCULAR | Status: AC | PRN
  Administered 2022-11-29: 25 mg via INTRAVENOUS

## 2022-11-29 NOTE — Progress Notes (Signed)
Hypersensitivity Reaction note  Date of event: 11/29/22 Time of event: 1510 Generic name of drug involved: Iron Sucrose (Venofer)  Name of provider notified of the hypersensitivity reaction: Beckey Rutter, NP  Was agent that likely caused hypersensitivity reaction added to Allergies List within EMR? No, per NP, not a true allergic reaction.  Chain of events including reaction signs/symptoms, treatment administered, and outcome (e.g., drug resumed; drug discontinued; 1503, drug started. At 1510, patient complained of feeling hot and was short of breath, drug stopped and NS started at 999 ml/hr. 1512 Benadryl 25 mg given IV. 1514 NP at chairside. VSS see flowsheets. At 1517, symptoms were improved. Per Beckey Rutter , NP restarted Venofer at 1549 at half rate, 241m/hr. Completed Venofer without further incident. Patient monitored for 30 minutes post infusion. VSS. Discharged via wheelchair in stable condition.  DDonnald Garre RN 11/29/2022 3:48 PM

## 2022-12-01 ENCOUNTER — Encounter: Payer: Self-pay | Admitting: Pain Medicine

## 2022-12-01 ENCOUNTER — Ambulatory Visit
Admission: RE | Admit: 2022-12-01 | Discharge: 2022-12-01 | Disposition: A | Discharge status: Home / Self Care | Payer: Medicare HMO | Source: Ambulatory Visit | Attending: Pain Medicine | Admitting: Pain Medicine

## 2022-12-01 ENCOUNTER — Ambulatory Visit: Payer: Medicare HMO | Attending: Pain Medicine | Admitting: Pain Medicine

## 2022-12-01 VITALS — BP 113/75 | HR 80 | Temp 97.4°F | Resp 26 | Ht 62.0 in | Wt 213.0 lb

## 2022-12-01 DIAGNOSIS — M5136 Other intervertebral disc degeneration, lumbar region: Secondary | ICD-10-CM | POA: Diagnosis not present

## 2022-12-01 DIAGNOSIS — Z7901 Long term (current) use of anticoagulants: Secondary | ICD-10-CM | POA: Insufficient documentation

## 2022-12-01 DIAGNOSIS — M545 Low back pain, unspecified: Secondary | ICD-10-CM | POA: Insufficient documentation

## 2022-12-01 DIAGNOSIS — G8929 Other chronic pain: Secondary | ICD-10-CM | POA: Insufficient documentation

## 2022-12-01 DIAGNOSIS — G8918 Other acute postprocedural pain: Secondary | ICD-10-CM | POA: Insufficient documentation

## 2022-12-01 DIAGNOSIS — M47816 Spondylosis without myelopathy or radiculopathy, lumbar region: Secondary | ICD-10-CM | POA: Diagnosis not present

## 2022-12-01 DIAGNOSIS — M47817 Spondylosis without myelopathy or radiculopathy, lumbosacral region: Secondary | ICD-10-CM | POA: Insufficient documentation

## 2022-12-01 DIAGNOSIS — R937 Abnormal findings on diagnostic imaging of other parts of musculoskeletal system: Secondary | ICD-10-CM | POA: Diagnosis not present

## 2022-12-01 MED ORDER — OXYCODONE-ACETAMINOPHEN 5-325 MG PO TABS: 1.0000 | ORAL_TABLET | Freq: Three times a day (TID) | ORAL | 0 refills | Status: AC | PRN

## 2022-12-01 MED ORDER — MIDAZOLAM HCL 5 MG/5ML IJ SOLN
0.5000 mg | Freq: Once | INTRAMUSCULAR | Status: AC
  Administered 2022-12-01: 2 mg via INTRAVENOUS

## 2022-12-01 MED ORDER — LACTATED RINGERS IV SOLN: Freq: Once | INTRAVENOUS | Status: AC

## 2022-12-01 MED ORDER — LIDOCAINE HCL 2 % IJ SOLN
20.0000 mL | Freq: Once | INTRAMUSCULAR | Status: AC
  Administered 2022-12-01: 400 mg

## 2022-12-01 MED ORDER — FENTANYL CITRATE (PF) 100 MCG/2ML IJ SOLN
25.0000 ug | INTRAMUSCULAR | Status: DC | PRN
  Administered 2022-12-01: 50 ug via INTRAVENOUS

## 2022-12-01 MED ORDER — ROPIVACAINE HCL 2 MG/ML IJ SOLN
INTRAMUSCULAR | Status: AC
  Filled 2022-12-01: qty 20

## 2022-12-01 MED ORDER — TRIAMCINOLONE ACETONIDE 40 MG/ML IJ SUSP
INTRAMUSCULAR | Status: AC
  Filled 2022-12-01: qty 1

## 2022-12-01 MED ORDER — LIDOCAINE HCL 2 % IJ SOLN
INTRAMUSCULAR | Status: AC
  Filled 2022-12-01: qty 20

## 2022-12-01 MED ORDER — OXYCODONE-ACETAMINOPHEN 5-325 MG PO TABS: 1.0000 | ORAL_TABLET | Freq: Three times a day (TID) | ORAL | 0 refills | Status: DC | PRN

## 2022-12-01 MED ORDER — TRIAMCINOLONE ACETONIDE 40 MG/ML IJ SUSP
40.0000 mg | Freq: Once | INTRAMUSCULAR | Status: AC
  Administered 2022-12-01: 40 mg

## 2022-12-01 MED ORDER — ROPIVACAINE HCL 2 MG/ML IJ SOLN
9.0000 mL | Freq: Once | INTRAMUSCULAR | Status: AC
  Administered 2022-12-01: 9 mL via PERINEURAL

## 2022-12-01 MED ORDER — PENTAFLUOROPROP-TETRAFLUOROETH EX AERO
INHALATION_SPRAY | Freq: Once | CUTANEOUS | Status: AC
  Administered 2022-12-01: 30 via TOPICAL

## 2022-12-01 MED ORDER — MIDAZOLAM HCL 5 MG/5ML IJ SOLN
INTRAMUSCULAR | Status: AC
  Filled 2022-12-01: qty 5

## 2022-12-01 MED ORDER — FENTANYL CITRATE (PF) 100 MCG/2ML IJ SOLN
INTRAMUSCULAR | Status: AC
  Filled 2022-12-01: qty 2

## 2022-12-01 NOTE — Patient Instructions (Addendum)
___________________________________________________________________________________________  Post-Radiofrequency (RF) Discharge Instructions  You have just completed a Radiofrequency Neurotomy.  The following instructions will provide you with information and guidelines for self-care upon discharge.  If at any time you have questions or concerns please call your physician. DO NOT DRIVE YOURSELF!!  Instructions:  Apply ice: Fill a plastic sandwich bag with crushed ice. Cover it with a small towel and apply to injection site. Apply for 15 minutes then remove x 15 minutes. Repeat sequence on day of procedure, until you go to bed. The purpose is to minimize swelling and discomfort after procedure.  Apply heat: Apply heat to procedure site starting the day following the procedure. The purpose is to treat any soreness and discomfort from the procedure.  Food intake: No eating limitations, unless stipulated above.  Nevertheless, if you have had sedation, you may experience some nausea.  In this case, it may be wise to wait at least two hours prior to resuming regular diet.  Physical activities: Keep activities to a minimum for the first 8 hours after the procedure. For the first 24 hours after the procedure, do not drive a motor vehicle,  Operate heavy machinery, power tools, or handle any weapons.  Consider walking with the use of an assistive device or accompanied by an adult for the first 24 hours.  Do not drink alcoholic beverages including beer.  Do not make any important decisions or sign any legal documents. Go home and rest today.  Resume activities tomorrow, as tolerated.  Use caution in moving about as you may experience mild leg weakness.  Use caution in cooking, use of household electrical appliances and climbing steps.  Driving: If you have received any sedation, you are not allowed to drive for 24 hours after your procedure.  Blood thinner: Restart your blood thinner 6 hours after your  procedure. (Only for those taking blood thinners)  Insulin: As soon as you can eat, you may resume your normal dosing schedule. (Only for those taking insulin)  Medications: May resume pre-procedure medications.  Do not take any drugs, other than what has been prescribed to you.  Infection prevention: Keep procedure site clean and dry.  Post-procedure Pain Diary: Extremely important that this be done correctly and accurately. Recorded information will be used to determine the next step in treatment.  Pain evaluated is that of treated area only. Do not include pain from an untreated area.  Complete every hour, on the hour, for the initial 8 hours. Set an alarm to help you do this part accurately.  Do not go to sleep and have it completed later. It will not be accurate.  Follow-up appointment: Keep your follow-up appointment after the procedure. Usually 2-6 weeks after radiofrequency. Bring you pain diary. The information collected will be essential for your long-term care.   Expect:  From numbing medicine (AKA: Local Anesthetics): Numbness or decrease in pain.  Onset: Full effect within 15 minutes of injected.  Duration: It will depend on the type of local anesthetic used. On the average, 1 to 8 hours.   From steroids (when added): Decrease in swelling or inflammation. Once inflammation is improved, relief of the pain will follow.  Onset of benefits: Depends on the amount of swelling present. The more swelling, the longer it will take for the benefits to be seen. In some cases, up to 10 days.  Duration: Steroids will stay in the system x 2 weeks. Duration of benefits will depend on multiple posibilities including persistent irritating factors.    this may last as long as 6 weeks. Additional post-procedure pain medication is provided for this. Discomfort is minimized if ice and heat are applied as  instructed.  Call if: You experience numbness and weakness that gets worse with time, as opposed to wearing off. He experience any unusual bleeding, difficulty breathing, or loss of the ability to control your bowel and bladder. (This applies to Spinal procedures only) You experience any redness, swelling, heat, red streaks, elevated temperature, fever, or any other signs of a possible infection.  Emergency Numbers: [facility] hours (Monday - Thursday, 8:00 AM - 4:00 PM) (Friday, 9:00 AM - 12:00 Noon): [phone] After hours: [phone] ____________________________________________________________________________________________    ______________________________________________________________________  Procedure instructions  Do not eat or drink fluids (other than water) for 6 hours before your procedure  No water for 2 hours before your procedure  Take your blood pressure medicine with a sip of water  Arrive 30 minutes before your appointment  Carefully read the "Preparing for your procedure" detailed instructions  If you have questions call us at [phone]  _____________________________________________________________________    ______________________________________________________________________  Preparing for your procedure  During your procedure appointment there will be: No Prescription Refills. No disability issues to discussed. No medication changes or discussions.  Instructions: Food intake: Avoid eating anything solid for at least 8 hours prior to your procedure. Clear liquid intake: You may take clear liquids such as water up to 2 hours prior to your procedure. (No carbonated drinks. No soda.) Transportation: Unless otherwise stated by your physician, bring a driver. Morning Medicines: Except for blood thinners, take all of your other morning medications with a sip of water. Make sure to take your heart and blood pressure medicines. If your  blood pressure's lower number is above 100, the case will be rescheduled. Blood thinners: Make sure to stop your blood thinners as instructed.  If you take a blood thinner, but were not instructed to stop it, call our office [phone] and ask to talk to a nurse. Not stopping a blood thinner prior to certain procedures could lead to serious complications. Diabetics on insulin: Notify the staff so that you can be scheduled 1st case in the morning. If your diabetes requires high dose insulin, take only  of your normal insulin dose the morning of the procedure and notify the staff that you have done so. Preventing infections: Shower with an antibacterial soap the morning of your procedure.  Build-up your immune system: Take 1000 mg of Vitamin C with every meal (3 times a day) the day prior to your procedure. Antibiotics: Inform the nursing staff if you are taking any antibiotics or if you have any conditions that may require antibiotics prior to procedures. (Example: recent joint implants)   Pregnancy: If you are pregnant make sure to notify the nursing staff. Not doing so may result in injury to the fetus, including death.  Sickness: If you have a cold, fever, or any active infections, call and cancel or reschedule your procedure. Receiving steroids while having an infection may result in complications. Arrival: You must be in the facility at least 30 minutes prior to your scheduled procedure. Tardiness: Your scheduled time is also the cutoff time. If you do not arrive at least 15 minutes prior to your procedure, you will be rescheduled.  Children: Do not bring any children with you. Make arrangements to keep them home. Dress appropriately: There is always a possibility that your clothing may get soiled. Avoid long  dresses. Valuables: Do not bring any jewelry or valuables.  Reasons to call and reschedule or cancel your procedure: (Following these recommendations will minimize the risk of a serious  complication.) Surgeries: Avoid having procedures within 2 weeks of any surgery. (Avoid for 2 weeks before or after any surgery). Flu Shots: Avoid having procedures within 2 weeks of a flu shots or . (Avoid for 2 weeks before or after immunizations). Barium: Avoid having a procedure within 7-10 days after having had a radiological study involving the use of radiological contrast. (Myelograms, Barium swallow or enema study). Heart attacks: Avoid any elective procedures or surgeries for the initial 6 months after a "Myocardial Infarction" (Heart Attack). Blood thinners: It is imperative that you stop these medications before procedures. Let us know if you if you take any blood thinner.  Infection: Avoid procedures during or within two weeks of an infection (including chest colds or gastrointestinal problems). Symptoms associated with infections include: Localized redness, fever, chills, night sweats or profuse sweating, burning sensation when voiding, cough, congestion, stuffiness, runny nose, sore throat, diarrhea, nausea, vomiting, cold or Flu symptoms, recent or current infections. It is specially important if the infection is over the area that we intend to treat. Heart and lung problems: Symptoms that may suggest an active cardiopulmonary problem include: cough, chest pain, breathing difficulties or shortness of breath, dizziness, ankle swelling, uncontrolled high or unusually low blood pressure, and/or palpitations. If you are experiencing any of these symptoms, cancel your procedure and contact your primary care physician for an evaluation.  Remember:  Regular Business hours are:  Monday to Thursday 8:00 AM to 4:00 PM  Provider's Schedule: Milinda Pointer, MD:  Procedure days: Tuesday and Thursday 7:30 AM to 4:00 PM  Gillis Santa, MD:  Procedure days: Monday and Wednesday 7:30 AM to 4:00 PM  ______________________________________________________________________     ____________________________________________________________________________________________  General Risks and Possible Complications  Patient Responsibilities: It is important that you read this as it is part of your informed consent. It is our duty to inform you of the risks and possible complications associated with treatments offered to you. It is your responsibility as a patient to read this and to ask questions about anything that is not clear or that you believe was not covered in this document.  Patient's Rights: You have the right to refuse treatment. You also have the right to change your mind, even after initially having agreed to have the treatment done. However, under this last option, if you wait until the last second to change your mind, you may be charged for the materials used up to that point.  Introduction: Medicine is not an Chief Strategy Officer. Everything in Medicine, including the lack of treatment(s), carries the potential for danger, harm, or loss (which is by definition: Risk). In Medicine, a complication is a secondary problem, condition, or disease that can aggravate an already existing one. All treatments carry the risk of possible complications. The fact that a side effects or complications occurs, does not imply that the treatment was conducted incorrectly. It must be clearly understood that these can happen even when everything is done following the highest safety standards.  No treatment: You can choose not to proceed with the proposed treatment alternative. The "PRO(s)" would include: avoiding the risk of complications associated with the therapy. The "CON(s)" would include: not getting any of the treatment benefits. These benefits fall under one of three categories: diagnostic; therapeutic; and/or palliative. Diagnostic benefits include: getting information which can ultimately lead  to improvement of the disease or symptom(s). Therapeutic benefits are those associated with  the successful treatment of the disease. Finally, palliative benefits are those related to the decrease of the primary symptoms, without necessarily curing the condition (example: decreasing the pain from a flare-up of a chronic condition, such as incurable terminal cancer).  General Risks and Complications: These are associated to most interventional treatments. They can occur alone, or in combination. They fall under one of the following six (6) categories: no benefit or worsening of symptoms; bleeding; infection; nerve damage; allergic reactions; and/or death. No benefits or worsening of symptoms: In Medicine there are no guarantees, only probabilities. No healthcare provider can ever guarantee that a medical treatment will work, they can only state the probability that it may. Furthermore, there is always the possibility that the condition may worsen, either directly, or indirectly, as a consequence of the treatment. Bleeding: This is more common if the patient is taking a blood thinner, either prescription or over the counter (example: Goody Powders, Fish oil, Aspirin, Garlic, etc.), or if suffering a condition associated with impaired coagulation (example: [name], cirrhosis of the liver, low platelet counts, etc.). However, even if you do not have one on these, it can still happen. If you have any of these conditions, or take one of these drugs, make sure to notify your treating physician. Infection: This is more common in patients with a compromised immune system, either due to disease (example: diabetes, cancer, human immunodeficiency virus [HIV], etc.), or due to medications or treatments (example: therapies used to treat cancer and rheumatological diseases). However, even if you do not have one on these, it can still happen. If you have any of these conditions, or take one of these drugs, make sure to notify your treating physician. Nerve Damage: This is more common when the treatment is an  invasive one, but it can also happen with the use of medications, such as those used in the treatment of cancer. The damage can occur to small secondary nerves, or to large primary ones, such as those in the spinal cord and brain. This damage may be temporary or permanent and it may lead to impairments that can range from temporary numbness to permanent paralysis and/or brain death. Allergic Reactions: Any time a substance or material comes in contact with our body, there is the possibility of an allergic reaction. These can range from a mild skin rash (contact dermatitis) to a severe systemic reaction (anaphylactic reaction), which can result in death. Death: In general, any medical intervention can result in death, most of the time due to an unforeseen complication. ____________________________________________________________________________________________   ____________________________________________________________________________________________  Blood Thinners  IMPORTANT NOTICE:  If you take any of these, make sure to notify the nursing staff.  Failure to do so may result in injury.  Recommended time intervals to stop and restart blood-thinners, before & after invasive procedures  Generic Name Brand Name Pre-procedure. Stop this long before procedure. Post-procedure. Minimum waiting period before restarting.  Abciximab Reopro 15 days 2 hrs  Alteplase Activase 10 days 10 days  Anagrelide Agrylin    Apixaban Eliquis 3 days 6 hrs  Cilostazol Pletal 3 days 5 hrs  Clopidogrel Plavix 7-10 days 2 hrs  Dabigatran Pradaxa 5 days 6 hrs  Dalteparin Fragmin 24 hours 4 hrs  Dipyridamole Aggrenox 11days 2 hrs  Edoxaban Lixiana; Savaysa 3 days 2 hrs  Enoxaparin  Lovenox 24 hours 4 hrs  Eptifibatide Integrillin 8 hours 2 hrs  Fondaparinux  Arixtra  72 hours 12 hrs  Hydroxychloroquine Plaquenil 11 days   Prasugrel Effient 7-10 days 6 hrs  Reteplase Retavase 10 days 10 days  Rivaroxaban  Xarelto 3 days 6 hrs  Ticagrelor Brilinta 5-7 days 6 hrs  Ticlopidine Ticlid 10-14 days 2 hrs  Tinzaparin Innohep 24 hours 4 hrs  Tirofiban Aggrastat 8 hours 2 hrs  Warfarin Coumadin 5 days 2 hrs   Other medications with blood-thinning effects  Product indications Generic (Brand) names Note  Cholesterol Lipitor Stop 4 days before procedure  Blood thinner (injectable) Heparin (LMW or LMWH Heparin) Stop 24 hours before procedure  Cancer Ibrutinib (Imbruvica) Stop 7 days before procedure  Malaria/Rheumatoid Hydroxychloroquine (Plaquenil) Stop 11 days before procedure  Thrombolytics  10 days before or after procedures   Over-the-counter (OTC) Products with blood-thinning effects  Product Common names Stop Time  Aspirin > 325 mg Goody Powders, Excedrin, etc. 11 days  Aspirin ? 81 mg  7 days  Fish oil  4 days  Garlic supplements  7 days  Ginkgo biloba  36 hours  Ginseng  24 hours  NSAIDs Ibuprofen, Naprosyn, etc. 3 days  Vitamin E  4 days   ____________________________________________________________________________________________

## 2022-12-01 NOTE — Progress Notes (Signed)
Safety precautions to be maintained throughout the outpatient stay will include: orient to surroundings, keep bed in low position, maintain call bell within reach at all times, provide assistance with transfer out of bed and ambulation.  

## 2022-12-02 ENCOUNTER — Telehealth: Payer: Self-pay | Admitting: *Deleted

## 2022-12-02 NOTE — Telephone Encounter (Signed)
Post procedure call.  Voicemail left.

## 2022-12-06 NOTE — Progress Notes (Unsigned)
Patient ID: Theresa [name], female    DOB: 04-22-1956, 67 y.o.   MRN: [id number]  HPI  Theresa [name] is a 66 y/o female with a history of chronic low back pain, HTN, depression, COPD on long-term oxygen, anxiety, carpal tunnel syndrome, long-standing tobacco use and chronic heart failure.  Echo report from 04/29/22 reviewed and showed an EF of 60-65% along with mild MR and no thrombus. Echo report from 08/08/21 reviewed and showed an EF of 60-65% along with mild LAE. Echo report from 09/03/20 reviewed and showed an EF of >55% with mild LAE and severe TR. Echo report from 07/07/2019 reviewed and showed an EF of 60-65% along with mild TR and moderately elevated PA pressure. Echo done 11/03/15 but unable to access results. Echo done on 02/19/15 showed an EF of 50% without valvular stenosis.   Catheterization done 07/11/2019 showed:  Prox RCA to Dist RCA lesion is 50% stenosed. Mid LAD lesion is 50% stenosed. Ost LM to Dist LM lesion is 10% stenosed. Hemodynamic findings consistent with moderate pulmonary hypertension. Normal overall left ventricular function of at least 60% Moderate coronary disease no significant obstructive disease Moderate pulmonary hypertension  Admitted 11/07/22 due to epistaxis and COPD exacerbation. Admitted 11/05/22 due to epistaxis. Admitted 08/09/22 due to cough and worsening SOB due to COPD exacerbation due to RSV. Hypotension due to overdiuresis. Blood transfusion given due to hemoglobin <7 due to slow GIB. Initially need increased oxygen at 8L but able to be weaned down to baseline 4L. [place] negative for DVT. Discharged after 17 days. Admitted 07/18/22 due to abdominal pain and nausea. Cardiology consult obtained. Troponin elevation thought to be due to demand ischemia. Discharged after 3 days.   She presents today for a HF follow-up visit with a chief complaint of minimal SOB with moderate exertion. Describes this as chronic in nature. Has associated fatigue, headaches,  light-headedness, anxiety, difficulty sleeping, nosebleeds and chronic pain along with this. Denies any abdominal distention, palpitations, pedal edema, chest pain, wheezing, cough or weight gain.   Last nosebleed was 5 days ago. Has not used furoscix since 09/2022 but does have 2 doses at home if needed.   Has noted that her BP at home has risen some over the last 3 days and she's unsure as to why.   Past Medical History:  Diagnosis Date   Acute postoperative pain 10/03/2017   Anxiety    BiPAP (biphasic positive airway pressure) dependence    at hs   Carpal tunnel syndrome    CHF (congestive heart failure) ([place])    1/18   CHF (congestive heart failure) ([facility])    Chronic generalized abdominal pain    Chronic rhinitis    COPD (chronic obstructive pulmonary disease) ([facility])    DDD (degenerative disc disease), cervical    DDD (degenerative disc disease), lumbosacral    Depression    Dyspnea    Edema    Flu    1/18   Gastritis    GERD (gastroesophageal reflux disease)    Hematuria    Hernia of abdominal wall 07/17/2018   Hernia, abdominal    Hypertension    Kidney stones    Low back pain    Lumbar radiculitis    Malodorous urine    Muscle weakness    Obesity    Oxygen dependent    Pneumonia due to COVID-19 virus 02/03/2020   Renal cyst    Sensory urge incontinence    Thyroid activity decreased    9/19  Tobacco abuse    Wheezing    Past Surgical History:  Procedure Laterality Date   ABDOMINAL HYSTERECTOMY     CARDIOVERSION N/A 04/29/2022   Procedure: CARDIOVERSION;  Surgeon: Corey Skains, MD;  Location: [facility];  Service: Cardiovascular;  Laterality: N/A;   CARPAL TUNNEL RELEASE Left 2012   cataract surgery Bilateral    EPIGASTRIC HERNIA REPAIR N/A 08/13/2018   HERNIA REPAIR EPIGASTRIC ADULT; polypropylene mesh reinforcement.  Surgeon: Robert Bellow, MD;  Location: [facility];  Service: General;  Laterality: N/A;   ESOPHAGOGASTRODUODENOSCOPY N/A 06/21/2022    Procedure: ESOPHAGOGASTRODUODENOSCOPY (EGD);  Surgeon: Toledo, Benay Pike, MD;  Location: [facility];  Service: Gastroenterology;  Laterality: N/A;   RIGHT/LEFT HEART CATH AND CORONARY ANGIOGRAPHY N/A 07/11/2019   Procedure: RIGHT/LEFT HEART CATH AND CORONARY ANGIOGRAPHY;  Surgeon: Yolonda Kida, MD;  Location: [facility];  Service: Cardiovascular;  Laterality: N/A;   TEE WITHOUT CARDIOVERSION N/A 04/29/2022   Procedure: TRANSESOPHAGEAL ECHOCARDIOGRAM (TEE);  Surgeon: Corey Skains, MD;  Location: [facility];  Service: Cardiovascular;  Laterality: N/A;   TONSILLECTOMY     TUBAL LIGATION     UMBILICAL HERNIA REPAIR N/A 08/13/2018   HERNIA REPAIR UMBILICAL ADULT; polypropylene mesh reinforcement Surgeon: Robert Bellow, MD;  Location: [facility];  Service: General;  Laterality: N/A;   Family History  Problem Relation Age of Onset   Heart disease Mother    Stroke Mother    Coronary artery disease Mother    Alcohol abuse Father    Heart disease Father    Lung cancer Sister    Cancer Sister    Breast cancer Sister    Cancer Brother    Cancer Brother    Kidney cancer Brother    Pneumonia Brother    Prostate cancer Neg Hx    Bladder Cancer Neg Hx    Social History   Tobacco Use   Smoking status: Some Days    Packs/day: 1.50    Years: 44.00    Total pack years: 66.00    Types: Cigarettes   Smokeless tobacco: Never   Tobacco comments:    over a half pack  Substance Use Topics   Alcohol use: No   Allergies  Allergen Reactions   Gabapentin Other (See Comments)    Higher doses of 800 mg, 600 mg, 300 mg causes excessive sedation and contributes to numerous mechanical falls. (Unsure if 100 mg dose can be tolerated)   Codeine Nausea And Vomiting   Meloxicam Other (See Comments)    Stomach pain   Prior to Admission medications   Medication Sig Start Date End Date Taking? Authorizing Provider  acetaminophen (TYLENOL) 500 MG tablet Take 1,000 mg by mouth every 8  (eight) hours as needed for mild pain or headache.   Yes [provider]  albuterol (VENTOLIN HFA) 108 (90 Base) MCG/ACT inhaler Inhale 2 puffs into the lungs every 6 (six) hours as needed for wheezing or shortness of breath. 10/23/19  Yes Veronese, [place], MD  amiodarone (PACERONE) 200 MG tablet Take 1 tablet (200 mg total) by mouth daily. 04/30/22  Yes Sharen Hones, MD  buPROPion [facility] SR) 150 MG 12 hr tablet Take 150 mg by mouth daily.   Yes [provider]  busPIRone (BUSPAR) 5 MG tablet TAKE 1 TABLET BY MOUTH TWICE DAILY 09/03/22  Yes Birdie Sons, MD  butalbital-acetaminophen-caffeine (FIORICET) 50-325-40 MG tablet Take 1 tablet by mouth every 6 (six) hours as needed for headache. LIMIT USE  TO PREVENT MEDICATION OVERUSE HEADACHE 04/09/22  Yes Emeterio Reeve, DO  Cholecalciferol 50 MCG (2000 UT) CAPS Take 2,000 Units by mouth daily.    Yes [provider]  dapagliflozin propanediol (FARXIGA) 10 MG TABS tablet Take 1 tablet (10 mg total) by mouth daily before breakfast. 08/31/22  Yes Darylene Price A, FNP  diclofenac Sodium (VOLTAREN) 1 % GEL Apply 2 g topically 4 (four) times daily as needed (lower back and/or shoulder pain).   Yes [provider]  diltiazem (CARDIZEM CD) 180 MG 24 hr capsule Take 180 mg by mouth daily.   Yes [provider]  ferrous sulfate 325 (65 FE) MG EC tablet Take 1 tablet (325 mg total) by mouth 2 (two) times daily with a meal. Patient taking differently: Take 325 mg by mouth daily with breakfast. 11/09/22  Yes Lorella Nimrod, MD  Fluticasone-Umeclidin-Vilant 100-62.5-25 MCG/INH AEPB Inhale 1 puff into the lungs daily.   Yes [provider]  HYDROcodone-acetaminophen (NORCO/VICODIN) 5-325 MG tablet Take 1 tablet by mouth every 8 (eight) hours as needed for severe pain. Must last 30 days 11/30/22 12/30/22 Yes Milinda Pointer, MD  HYDROcodone-acetaminophen (NORCO/VICODIN) 5-325 MG tablet Take 1 tablet by mouth  every 8 (eight) hours as needed for severe pain. Must last 30 days 12/30/22 01/29/23 Yes Milinda Pointer, MD  ipratropium-albuterol (DUONEB) 0.5-2.5 (3) MG/3ML SOLN USE 1 VIAL VIA NEBULIZER EVERY 6 HOURS AS NEEDED FOR SHORTNESS OF BREATH OR WHEEZING 11/30/20  Yes Chrismon, Vickki Muff, PA-C  IRON SUCROSE IV Inject 200 mg into the vein once a week.   Yes [provider]  melatonin 5 MG TABS Take 5 mg by mouth at bedtime.   Yes [provider]  naloxone (NARCAN) nasal spray 4 mg/0.1 mL Place 1 spray into the nose as needed for up to 365 doses (for opioid-induced respiratory depresssion). In case of emergency (overdose), spray once into each nostril. If no response within 3 minutes, repeat application and call [phone]. 08/03/22 08/03/23 Yes Milinda Pointer, MD  omeprazole (PRILOSEC) 40 MG capsule Take 40 mg by mouth daily. 11/01/22 11/01/23 Yes [provider]  oxyCODONE-acetaminophen (PERCOCET) 5-325 MG tablet Take 1 tablet by mouth every 8 (eight) hours as needed for up to 7 days for severe pain. Must last 7 days. 12/01/22 12/08/22 Yes Milinda Pointer, MD  oxyCODONE-acetaminophen (PERCOCET) 5-325 MG tablet Take 1 tablet by mouth every 8 (eight) hours as needed for up to 7 days for severe pain. Must last 7 days. 12/08/22 12/15/22 Yes Milinda Pointer, MD  polyethylene glycol (MIRALAX / GLYCOLAX) 17 g packet Take 17 g by mouth daily as needed for mild constipation or moderate constipation.   Yes [provider]  potassium chloride SA (KLOR-CON M) 20 MEQ tablet Take 2 tablets every morning and 1 tablet every evening 06/22/22  Yes Laren Whaling A, FNP  roflumilast (DALIRESP) 500 MCG TABS tablet Take 500 mcg by mouth daily.   Yes [provider]  sertraline (ZOLOFT) 100 MG tablet TAKE 2 TABLETS BY MOUTH ONCE EVERY MORNING 10/03/22  Yes Birdie Sons, MD  simvastatin (ZOCOR) 20 MG tablet Take 20 mg by mouth at bedtime.   Yes [provider]  spironolactone  (ALDACTONE) 25 MG tablet Take 25 mg by mouth daily.   Yes [provider]  torsemide (DEMADEX) 20 MG tablet Take 40-80 mg by mouth See admin instructions. Take 4 tablets (30m) by mouth every morning and take 2 tablets (437m by mouth every afternoon   Yes  [provider]  varenicline (CHANTIX) 1 MG tablet Take 1 mg by mouth 2 (two) times daily.   Yes [provider]  FUROSCIX 80 MG/10ML CTKT Inject into the skin. Patient not taking: Reported on 12/07/2022 09/02/22   [provider]  HYDROcodone-acetaminophen (NORCO/VICODIN) 5-325 MG tablet Take 1 tablet by mouth every 8 (eight) hours as needed for severe pain. Must last 30 days 10/31/22 11/30/22  Milinda Pointer, MD    Review of Systems  Constitutional:  Positive for fatigue. Negative for appetite change.  HENT:  Positive for nosebleeds (last nosebleed 5 days ago).   Eyes: Negative.   Respiratory:  Positive for shortness of breath (improving). Negative for cough, chest tightness and wheezing.   Cardiovascular:  Negative for chest pain, palpitations and leg swelling.  Gastrointestinal:  Negative for abdominal distention, abdominal pain and nausea.  Endocrine: Negative.   Genitourinary: Negative.   Musculoskeletal:  Positive for arthralgias (right shoulder) and back pain.  Skin: Negative.   Allergic/Immunologic: Negative.   Neurological:  Positive for light-headedness and headaches. Negative for dizziness.  Hematological:  Negative for adenopathy. Bruises/bleeds easily.  Psychiatric/Behavioral:  Positive for sleep disturbance (wearing oxygen & bipap at night). Negative for agitation, dysphoric mood and suicidal ideas. The patient is nervous/anxious.    Vitals:   12/07/22 1234  BP: (!) 144/67  Pulse: 83  Resp: 18  SpO2: 92%  Weight: 210 lb (95.3 kg)   Wt Readings from Last 3 Encounters:  12/07/22 210 lb (95.3 kg)  12/01/22 213 lb (96.6 kg)  11/18/22 213 lb (96.6 kg)   Lab Results  Component  Value Date   CREATININE 0.71 11/08/2022   CREATININE 0.84 11/07/2022   CREATININE 0.71 11/06/2022   Physical Exam Vitals and nursing note reviewed.  Constitutional:      Appearance: She is well-developed.  HENT:     Head: Normocephalic and atraumatic.  Neck:     Vascular: No JVD.  Cardiovascular:     Rate and Rhythm: Normal rate and regular rhythm.  Pulmonary:     Effort: Pulmonary effort is normal.     Breath sounds: No wheezing, rhonchi or rales.  Abdominal:     General: There is no distension.     Palpations: Abdomen is soft.     Tenderness: There is no abdominal tenderness.  Musculoskeletal:        General: No tenderness.     Cervical back: Normal range of motion and neck supple.     Right lower leg: No edema.     Left lower leg: No edema.  Skin:    General: Skin is warm and dry.  Neurological:     General: No focal deficit present.     Mental Status: She is alert and oriented to person, place, and time.  Psychiatric:        Mood and Affect: Mood normal. Mood is not depressed.        Behavior: Behavior normal.        Thought Content: Thought content normal.   Assessment & Plan:  1: Chronic heart failure with preserved ejection fraction without structural changes- - NYHA class II - euvolemic today - weighing daily & weight chart reviewed; reminded to call for an overnight weight gain of >2 pounds or a weekly weight gain of >5 pounds - weight down 3 pounds from last visit here 3 weeks ago - consider entresto if BP remains elevated - pharm checked and entresto would be $0 copay - saw [facility]  provider (Bensimhon) 09/29/22 - wearing compression socks daily  - BNP 08/31/22 was 36.4 - PharmD reconciled meds w/ patient  2: HTN- - BP log reviewed 144/67 - saw PCP (Bronstein) 09/14/22 - BMP from 11/08/22 reviewed and showed sodium 139, potassium 3.9, creatinine 0.71 and GFR >60  3: COPD- - wearing bipap nightly - oxygen at 6L at rest, 8L on exertion and 10L when  showering - saw pulmonology Raul Del) 11/28/22  4: Tobacco use- - smoking 1 ppd / week - using chantix - removes herself from the oxygen when smoking  5: Atrial flutter- - previous cardioversion done - saw cardiologist Clayborn Bigness) 11/11/22  6: Pulmonary HTN- - mild to moderate on cath in 2020 - cath with high output physiology. No shunts on TEE. No evidence of anomalous PV drainage or other shunts on CT - CT chest 2023 no PE. 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  - no role for selective vasodilators -> stop letairis - continue O2 and Bipap support   Medication list reviewed.   Return in 2 months, sooner if needed.

## 2022-12-07 ENCOUNTER — Encounter: Payer: Self-pay | Admitting: Family

## 2022-12-07 ENCOUNTER — Encounter: Payer: Self-pay | Admitting: Pharmacist

## 2022-12-07 ENCOUNTER — Ambulatory Visit: Payer: Medicare HMO | Attending: Family | Admitting: Family

## 2022-12-07 VITALS — BP 144/67 | HR 83 | Resp 18 | Wt 210.0 lb

## 2022-12-07 DIAGNOSIS — G5600 Carpal tunnel syndrome, unspecified upper limb: Secondary | ICD-10-CM | POA: Insufficient documentation

## 2022-12-07 DIAGNOSIS — I4892 Unspecified atrial flutter: Secondary | ICD-10-CM

## 2022-12-07 DIAGNOSIS — J449 Chronic obstructive pulmonary disease, unspecified: Secondary | ICD-10-CM | POA: Diagnosis not present

## 2022-12-07 DIAGNOSIS — F17210 Nicotine dependence, cigarettes, uncomplicated: Secondary | ICD-10-CM | POA: Diagnosis not present

## 2022-12-07 DIAGNOSIS — I110 Hypertensive heart disease with heart failure: Secondary | ICD-10-CM | POA: Diagnosis not present

## 2022-12-07 DIAGNOSIS — I2510 Atherosclerotic heart disease of native coronary artery without angina pectoris: Secondary | ICD-10-CM | POA: Diagnosis not present

## 2022-12-07 DIAGNOSIS — Z720 Tobacco use: Secondary | ICD-10-CM | POA: Diagnosis not present

## 2022-12-07 DIAGNOSIS — K219 Gastro-esophageal reflux disease without esophagitis: Secondary | ICD-10-CM | POA: Diagnosis not present

## 2022-12-07 DIAGNOSIS — R5383 Other fatigue: Secondary | ICD-10-CM | POA: Insufficient documentation

## 2022-12-07 DIAGNOSIS — E039 Hypothyroidism, unspecified: Secondary | ICD-10-CM | POA: Insufficient documentation

## 2022-12-07 DIAGNOSIS — R109 Unspecified abdominal pain: Secondary | ICD-10-CM | POA: Diagnosis not present

## 2022-12-07 DIAGNOSIS — R0602 Shortness of breath: Secondary | ICD-10-CM | POA: Diagnosis not present

## 2022-12-07 DIAGNOSIS — Z79899 Other long term (current) drug therapy: Secondary | ICD-10-CM | POA: Diagnosis not present

## 2022-12-07 DIAGNOSIS — I272 Pulmonary hypertension, unspecified: Secondary | ICD-10-CM | POA: Diagnosis not present

## 2022-12-07 DIAGNOSIS — R519 Headache, unspecified: Secondary | ICD-10-CM | POA: Insufficient documentation

## 2022-12-07 DIAGNOSIS — Z7984 Long term (current) use of oral hypoglycemic drugs: Secondary | ICD-10-CM | POA: Insufficient documentation

## 2022-12-07 DIAGNOSIS — I10 Essential (primary) hypertension: Secondary | ICD-10-CM | POA: Diagnosis not present

## 2022-12-07 DIAGNOSIS — Z8616 Personal history of COVID-19: Secondary | ICD-10-CM | POA: Diagnosis not present

## 2022-12-07 DIAGNOSIS — Z9981 Dependence on supplemental oxygen: Secondary | ICD-10-CM | POA: Insufficient documentation

## 2022-12-07 DIAGNOSIS — G8929 Other chronic pain: Secondary | ICD-10-CM | POA: Diagnosis not present

## 2022-12-07 DIAGNOSIS — R42 Dizziness and giddiness: Secondary | ICD-10-CM | POA: Insufficient documentation

## 2022-12-07 DIAGNOSIS — I5032 Chronic diastolic (congestive) heart failure: Secondary | ICD-10-CM

## 2022-12-07 DIAGNOSIS — F32A Depression, unspecified: Secondary | ICD-10-CM | POA: Insufficient documentation

## 2022-12-07 DIAGNOSIS — F419 Anxiety disorder, unspecified: Secondary | ICD-10-CM | POA: Insufficient documentation

## 2022-12-07 DIAGNOSIS — G479 Sleep disorder, unspecified: Secondary | ICD-10-CM | POA: Insufficient documentation

## 2022-12-07 DIAGNOSIS — R69 Illness, unspecified: Secondary | ICD-10-CM | POA: Diagnosis not present

## 2022-12-07 NOTE — Patient Instructions (Addendum)
Continue weighing daily and call for an overnight weight gain of 3 pounds or more or a weekly weight gain of more than 5 pounds.  Keep checking your blood pressure daily and let me know if it doesn't start to decline

## 2022-12-08 ENCOUNTER — Telehealth: Payer: Self-pay | Admitting: Pain Medicine

## 2022-12-08 ENCOUNTER — Encounter: Payer: Medicare HMO | Admitting: Family

## 2022-12-08 ENCOUNTER — Other Ambulatory Visit: Payer: Self-pay | Admitting: Pain Medicine

## 2022-12-08 DIAGNOSIS — J449 Chronic obstructive pulmonary disease, unspecified: Secondary | ICD-10-CM | POA: Diagnosis not present

## 2022-12-08 DIAGNOSIS — M792 Neuralgia and neuritis, unspecified: Secondary | ICD-10-CM

## 2022-12-08 DIAGNOSIS — J9610 Chronic respiratory failure, unspecified whether with hypoxia or hypercapnia: Secondary | ICD-10-CM | POA: Diagnosis not present

## 2022-12-08 MED ORDER — PREGABALIN 75 MG PO CAPS: 75.0000 mg | ORAL_CAPSULE | Freq: Three times a day (TID) | ORAL | 2 refills | Status: DC

## 2022-12-08 NOTE — Telephone Encounter (Signed)
Called patient. No answer. LVM and stressed the importance of returning the call to discuss her issues.

## 2022-12-08 NOTE — Progress Notes (Deleted)
Established patient visit   Patient: Theresa [name]   DOB: 12/05/1955   67 y.o. Female  MRN: [id number] Visit Date: 12/09/2022  Today's healthcare provider: [name], FNP   No chief complaint on file.  Subjective    HPI  Follow up for Medication Management   The patient was last seen for this 4 months ago. She reports {excellent/good/fair/poor:19665} compliance with treatment. She feels that condition is {improved/worse/unchanged:3041574}. She {is/is not:21021397} having side effects. ***  -----------------------------------------------------------------------------------------   Medications: Outpatient Medications Prior to Visit  Medication Sig   acetaminophen (TYLENOL) 500 MG tablet Take 1,000 mg by mouth every 8 (eight) hours as needed for mild pain or headache.   albuterol (VENTOLIN HFA) 108 (90 Base) MCG/ACT inhaler Inhale 2 puffs into the lungs every 6 (six) hours as needed for wheezing or shortness of breath.   amiodarone (PACERONE) 200 MG tablet Take 1 tablet (200 mg total) by mouth daily.   buPROPion (WELLBUTRIN SR) 150 MG 12 hr tablet Take 150 mg by mouth daily.   busPIRone (BUSPAR) 5 MG tablet TAKE 1 TABLET BY MOUTH TWICE DAILY   butalbital-acetaminophen-caffeine (FIORICET) 50-325-40 MG tablet Take 1 tablet by mouth every 6 (six) hours as needed for headache. LIMIT USE TO PREVENT MEDICATION OVERUSE HEADACHE   Cholecalciferol 50 MCG (2000 UT) CAPS Take 2,000 Units by mouth daily.    dapagliflozin propanediol (FARXIGA) 10 MG TABS tablet Take 1 tablet (10 mg total) by mouth daily before breakfast.   diclofenac Sodium (VOLTAREN) 1 % GEL Apply 2 g topically 4 (four) times daily as needed (lower back and/or shoulder pain).   diltiazem (CARDIZEM CD) 180 MG 24 hr capsule Take 180 mg by mouth daily.   ferrous sulfate 325 (65 FE) MG EC tablet Take 1 tablet (325 mg total) by mouth 2 (two) times daily with a meal. (Patient taking differently: Take 325 mg by mouth daily  with breakfast.)   Fluticasone-Umeclidin-Vilant 100-62.5-25 MCG/INH AEPB Inhale 1 puff into the lungs daily.   FUROSCIX 80 MG/10ML CTKT Inject into the skin. (Patient not taking: Reported on 12/07/2022)   HYDROcodone-acetaminophen (NORCO/VICODIN) 5-325 MG tablet Take 1 tablet by mouth every 8 (eight) hours as needed for severe pain. Must last 30 days   HYDROcodone-acetaminophen (NORCO/VICODIN) 5-325 MG tablet Take 1 tablet by mouth every 8 (eight) hours as needed for severe pain. Must last 30 days   [START ON 12/30/2022] HYDROcodone-acetaminophen (NORCO/VICODIN) 5-325 MG tablet Take 1 tablet by mouth every 8 (eight) hours as needed for severe pain. Must last 30 days   ipratropium-albuterol (DUONEB) 0.5-2.5 (3) MG/3ML SOLN USE 1 VIAL VIA NEBULIZER EVERY 6 HOURS AS NEEDED FOR SHORTNESS OF BREATH OR WHEEZING   IRON SUCROSE IV Inject 200 mg into the vein once a week.   melatonin 5 MG TABS Take 5 mg by mouth at bedtime.   naloxone (NARCAN) nasal spray 4 mg/0.1 mL Place 1 spray into the nose as needed for up to 365 doses (for opioid-induced respiratory depresssion). In case of emergency (overdose), spray once into each nostril. If no response within 3 minutes, repeat application and call [phone].   omeprazole (PRILOSEC) 40 MG capsule Take 40 mg by mouth daily.   oxyCODONE-acetaminophen (PERCOCET) 5-325 MG tablet Take 1 tablet by mouth every 8 (eight) hours as needed for up to 7 days for severe pain. Must last 7 days.   oxyCODONE-acetaminophen (PERCOCET) 5-325 MG tablet Take 1 tablet by mouth every 8 (eight) hours as needed for  up to 7 days for severe pain. Must last 7 days.   polyethylene glycol (MIRALAX / GLYCOLAX) 17 g packet Take 17 g by mouth daily as needed for mild constipation or moderate constipation.   potassium chloride SA (KLOR-CON M) 20 MEQ tablet Take 2 tablets every morning and 1 tablet every evening   pregabalin (LYRICA) 75 MG capsule Take 1 capsule (75 mg total) by mouth 3 (three) times daily.    roflumilast (DALIRESP) 500 MCG TABS tablet Take 500 mcg by mouth daily.   sertraline (ZOLOFT) 100 MG tablet TAKE 2 TABLETS BY MOUTH ONCE EVERY MORNING   simvastatin (ZOCOR) 20 MG tablet Take 20 mg by mouth at bedtime.   spironolactone (ALDACTONE) 25 MG tablet Take 25 mg by mouth daily.   torsemide (DEMADEX) 20 MG tablet Take 40-80 mg by mouth See admin instructions. Take 4 tablets (17m) by mouth every morning and take 2 tablets (464m by mouth every afternoon   varenicline (CHANTIX) 1 MG tablet Take 1 mg by mouth 2 (two) times daily.   No facility-administered medications prior to visit.    Review of Systems  {Labs  Heme  Chem  Endocrine  Serology  Results Review (optional):23779}   Objective    There were no vitals taken for this visit. {Show previous vital signs (optional):23777}  Physical Exam  ***  No results found for any visits on 12/09/22.  Assessment & Plan     ***  No follow-ups on file.      {provider attestation***:1}   [place]wyneth [phone]) [phone])  [facility]

## 2022-12-08 NOTE — Telephone Encounter (Signed)
Spoke with DR Dossie Arbour and explained symptoms.  Informed patient to keep on the lookout for skin lesions since she thinks it feels like shingles.  Informed patient that it may take up to 6 weeks for the healing to take place.  Informed Dr Dossie Arbour that she can't take Gabapentin but has never tried Lyrica. Will let patient know if he sends in a script for Lyrica. Informed patient to call us for any further questions or concerns.

## 2022-12-08 NOTE — Progress Notes (Signed)
Patient ID: Theresa [name], female   DOB: 1955/10/27, 67 y.o.   MRN: [id number]  [facility] - PHARMACIST COUNSELING NOTE   *HFpEF*  Guideline-Directed Medical Therapy/Evidence Based Medicine  ACE/ARB/ARNI:  none Beta Blocker:  none Aldosterone Antagonist: Spironolactone 25 mg daily Diuretic: Torsemide 74m in AM  and 40 mg in PM SGLT2i: Dapagliflozin 10 mg daily  Adherence Assessment  Do you ever forget to take your medication? []$ Yes [x]$ No  Do you ever skip doses due to side effects? []$ Yes [x]$ No  Do you have trouble affording your medicines? []$ Yes [x]$ No  Are you ever unable to pick up your medication due to transportation difficulties? []$ Yes [x]$ No  Do you ever stop taking your medications because you don't believe they are helping? []$ Yes [x]$ No  Do you check your weight daily? [x]$ Yes []$ No   Adherence strategy: pill box  Barriers to obtaining medications: none  Vital signs: HR 83, BP 144/67, weight (pounds) 210 lb  ECHO: Date 04/29/2022, EF of 60-65% along with mild MR   Cath: Date 07/11/2019, Normal overall left ventricular function of at least 60%      Latest Ref Rng & Units 11/08/2022    4:39 AM 11/07/2022   11:37 AM 11/06/2022    4:44 AM  BMP  Glucose 70 - 99 mg/dL 126  98  141   BUN 8 - 23 mg/dL 12  13  14   $ Creatinine 0.44 - 1.00 mg/dL 0.71  0.84  0.71   Sodium 135 - 145 mmol/L 139  143  137   Potassium 3.5 - 5.1 mmol/L 3.9  3.4  3.7   Chloride 98 - 111 mmol/L 102  103  103   CO2 22 - 32 mmol/L 31  30  27   $ Calcium 8.9 - 10.3 mg/dL 8.6  8.9  8.1     Past Medical History:  Diagnosis Date   Acute postoperative pain 10/03/2017   Anxiety    BiPAP (biphasic positive airway pressure) dependence    at hs   Carpal tunnel syndrome    CHF (congestive heart failure) ([facility]    1/18   CHF (congestive heart failure) ([facility])    Chronic generalized abdominal pain    Chronic rhinitis    COPD (chronic obstructive pulmonary  disease) ([facility])    DDD (degenerative disc disease), cervical    DDD (degenerative disc disease), lumbosacral    Depression    Dyspnea    Edema    Flu    1/18   Gastritis    GERD (gastroesophageal reflux disease)    Hematuria    Hernia of abdominal wall 07/17/2018   Hernia, abdominal    Hypertension    Kidney stones    Low back pain    Lumbar radiculitis    Malodorous urine    Muscle weakness    Obesity    Oxygen dependent    Pneumonia due to COVID-19 virus 02/03/2020   Renal cyst    Sensory urge incontinence    Thyroid activity decreased    9/19   Tobacco abuse    Wheezing     ASSESSMENT 67year old female who presents to the HF clinic for follow up. PMH includes chronic low back pain, HTN, depression, COPD on long-term oxygen, anxiety, carpal tunnel syndrome, long-standing tobacco use and chronic heart failure. Last acute care admission was Jan/22/2024 due to epistaxis and COPD exacerbation. Patient scheduled to see neurologist in March to treat chronic headaches.  Medication reconciliation competed during [place]. Denies any ADR to current therapy or issues with affordability. Last dose of Furoscix was Dec/2023 and still have supply available at home. Next iron infusion per hematologist order. Noted BP slightly above normal. Per patient's log BP ~140 for last 3 days.   PLAN No change in therapy today Will consider adding Valsartan during f/u visit if BP remains elevated. F/U as directed by NP.  Time spent: 20 minutes  Dong Nimmons Rodriguez-Guzman PharmD, BCPS 12/08/2022 8:38 AM   Current Outpatient Medications:    acetaminophen (TYLENOL) 500 MG tablet, Take 1,000 mg by mouth every 8 (eight) hours as needed for mild pain or headache., Disp: , Rfl:    albuterol (VENTOLIN HFA) 108 (90 Base) MCG/ACT inhaler, Inhale 2 puffs into the lungs every 6 (six) hours as needed for wheezing or shortness of breath., Disp: 8 g, Rfl: 1   amiodarone (PACERONE) 200 MG tablet, Take 1 tablet (200  mg total) by mouth daily., Disp: 30 tablet, Rfl: 0   buPROPion (WELLBUTRIN SR) 150 MG 12 hr tablet, Take 150 mg by mouth daily., Disp: , Rfl:    busPIRone (BUSPAR) 5 MG tablet, TAKE 1 TABLET BY MOUTH TWICE DAILY, Disp: 180 tablet, Rfl: 1   butalbital-acetaminophen-caffeine (FIORICET) 50-325-40 MG tablet, Take 1 tablet by mouth every 6 (six) hours as needed for headache. LIMIT USE TO PREVENT MEDICATION OVERUSE HEADACHE, Disp: 14 tablet, Rfl: 0   Cholecalciferol 50 MCG (2000 UT) CAPS, Take 2,000 Units by mouth daily. , Disp: , Rfl:    dapagliflozin propanediol (FARXIGA) 10 MG TABS tablet, Take 1 tablet (10 mg total) by mouth daily before breakfast., Disp: 30 tablet, Rfl: 5   diclofenac Sodium (VOLTAREN) 1 % GEL, Apply 2 g topically 4 (four) times daily as needed (lower back and/or shoulder pain)., Disp: , Rfl:    diltiazem (CARDIZEM CD) 180 MG 24 hr capsule, Take 180 mg by mouth daily., Disp: , Rfl:    ferrous sulfate 325 (65 FE) MG EC tablet, Take 1 tablet (325 mg total) by mouth 2 (two) times daily with a meal. (Patient taking differently: Take 325 mg by mouth daily with breakfast.), Disp: 60 tablet, Rfl: 3   Fluticasone-Umeclidin-Vilant 100-62.5-25 MCG/INH AEPB, Inhale 1 puff into the lungs daily., Disp: , Rfl:    FUROSCIX 80 MG/10ML CTKT, Inject into the skin. (Patient not taking: Reported on 12/07/2022), Disp: , Rfl:    HYDROcodone-acetaminophen (NORCO/VICODIN) 5-325 MG tablet, Take 1 tablet by mouth every 8 (eight) hours as needed for severe pain. Must last 30 days, Disp: 90 tablet, Rfl: 0   HYDROcodone-acetaminophen (NORCO/VICODIN) 5-325 MG tablet, Take 1 tablet by mouth every 8 (eight) hours as needed for severe pain. Must last 30 days, Disp: 90 tablet, Rfl: 0   [START ON 12/30/2022] HYDROcodone-acetaminophen (NORCO/VICODIN) 5-325 MG tablet, Take 1 tablet by mouth every 8 (eight) hours as needed for severe pain. Must last 30 days, Disp: 90 tablet, Rfl: 0   ipratropium-albuterol (DUONEB) 0.5-2.5 (3)  MG/3ML SOLN, USE 1 VIAL VIA NEBULIZER EVERY 6 HOURS AS NEEDED FOR SHORTNESS OF BREATH OR WHEEZING, Disp: 1080 mL, Rfl: 0   IRON SUCROSE IV, Inject 200 mg into the vein once a week., Disp: , Rfl:    melatonin 5 MG TABS, Take 5 mg by mouth at bedtime., Disp: , Rfl:    naloxone (NARCAN) nasal spray 4 mg/0.1 mL, Place 1 spray into the nose as needed for up to 365 doses (for opioid-induced respiratory depresssion). In case of  emergency (overdose), spray once into each nostril. If no response within 3 minutes, repeat application and call [phone]., Disp: 1 each, Rfl: 0   omeprazole (PRILOSEC) 40 MG capsule, Take 40 mg by mouth daily., Disp: , Rfl:    oxyCODONE-acetaminophen (PERCOCET) 5-325 MG tablet, Take 1 tablet by mouth every 8 (eight) hours as needed for up to 7 days for severe pain. Must last 7 days., Disp: 21 tablet, Rfl: 0   oxyCODONE-acetaminophen (PERCOCET) 5-325 MG tablet, Take 1 tablet by mouth every 8 (eight) hours as needed for up to 7 days for severe pain. Must last 7 days., Disp: 21 tablet, Rfl: 0   polyethylene glycol (MIRALAX / GLYCOLAX) 17 g packet, Take 17 g by mouth daily as needed for mild constipation or moderate constipation., Disp: , Rfl:    potassium chloride SA (KLOR-CON M) 20 MEQ tablet, Take 2 tablets every morning and 1 tablet every evening, Disp: 270 tablet, Rfl: 3   roflumilast (DALIRESP) 500 MCG TABS tablet, Take 500 mcg by mouth daily., Disp: , Rfl:    sertraline (ZOLOFT) 100 MG tablet, TAKE 2 TABLETS BY MOUTH ONCE EVERY MORNING, Disp: 180 tablet, Rfl: 0   simvastatin (ZOCOR) 20 MG tablet, Take 20 mg by mouth at bedtime., Disp: , Rfl:    spironolactone (ALDACTONE) 25 MG tablet, Take 25 mg by mouth daily., Disp: , Rfl:    torsemide (DEMADEX) 20 MG tablet, Take 40-80 mg by mouth See admin instructions. Take 4 tablets (56m) by mouth every morning and take 2 tablets (462m by mouth every afternoon, Disp: , Rfl:    varenicline (CHANTIX) 1 MG tablet, Take 1 mg by mouth 2 (two) times  daily., Disp: , Rfl:    MEDICATION ADHERENCES TIPS AND STRATEGIES Taking medication as prescribed improves patient outcomes in heart failure (reduces hospitalizations, improves symptoms, increases survival) Side effects of medications can be managed by decreasing doses, switching agents, stopping drugs, or adding additional therapy. Please [facility] someone [facility] if you have having bothersome side effects so we can modify your regimen. Do not alter your medication regimen without talking to [place] Medication reminders can help patients remember to take drugs on time. If you are missing or forgetting doses you can try linking behaviors, using pill boxes, or an electronic reminder like an alarm on your phone or an app. Some people can also get automated phone calls as medication reminders.

## 2022-12-08 NOTE — Telephone Encounter (Signed)
Spoke with patient.  She states she is having burning painin the right side of her leg.  States she can hardly have anything touching her skin.  Feels like when she had shingles.  States it is warm to touch.  Denies fever. Will discuss with Dr Dossie Arbour.

## 2022-12-08 NOTE — Telephone Encounter (Signed)
PT stated that since Monday she has been having some pain and burning sensational to the area where the procedure was done at. PT sated she also has pain going down her back. PT stated that it's hot to touch as well. PT wants to see what can be done. Please give patient a call. TY

## 2022-12-09 ENCOUNTER — Telehealth: Payer: Self-pay | Admitting: Pain Medicine

## 2022-12-09 ENCOUNTER — Ambulatory Visit: Payer: Medicare HMO | Admitting: Family Medicine

## 2022-12-09 NOTE — Telephone Encounter (Signed)
PT stated that the pharmacy hasn't got her prescription that was supposed to been sent in. Please give patient a call. TY

## 2022-12-12 ENCOUNTER — Inpatient Hospital Stay: Payer: Medicare HMO

## 2022-12-12 ENCOUNTER — Other Ambulatory Visit: Payer: Self-pay | Admitting: Pain Medicine

## 2022-12-12 ENCOUNTER — Telehealth: Payer: Self-pay | Admitting: Pain Medicine

## 2022-12-12 ENCOUNTER — Other Ambulatory Visit: Payer: Self-pay | Admitting: *Deleted

## 2022-12-12 DIAGNOSIS — D509 Iron deficiency anemia, unspecified: Secondary | ICD-10-CM | POA: Diagnosis not present

## 2022-12-12 DIAGNOSIS — M792 Neuralgia and neuritis, unspecified: Secondary | ICD-10-CM

## 2022-12-12 DIAGNOSIS — D500 Iron deficiency anemia secondary to blood loss (chronic): Secondary | ICD-10-CM

## 2022-12-12 LAB — RETIC PANEL
Immature Retic Fract: 19.5 % — ABNORMAL HIGH (ref 2.3–15.9)
RBC.: 4.98 MIL/uL (ref 3.87–5.11)
Retic Count, Absolute: 102.6 10*3/uL (ref 19.0–186.0)
Retic Ct Pct: 2.1 % (ref 0.4–3.1)
Reticulocyte Hemoglobin: 32.3 pg (ref >27.9)

## 2022-12-12 LAB — CBC WITH DIFFERENTIAL/PLATELET
Abs Immature Granulocytes: 0.03 10*3/uL (ref 0.00–0.07)
Basophils Absolute: 0 10*3/uL (ref 0.0–0.1)
Basophils Relative: 0 %
Eosinophils Absolute: 0.1 10*3/uL (ref 0.0–0.5)
Eosinophils Relative: 1 %
HCT: 47.2 % — ABNORMAL HIGH (ref 36.0–46.0)
Hemoglobin: 13.4 g/dL (ref 12.0–15.0)
Immature Granulocytes: 0 %
Lymphocytes Relative: 10 %
Lymphs Abs: 0.9 10*3/uL (ref 0.7–4.0)
MCH: 26.7 pg (ref 26.0–34.0)
MCHC: 28.4 g/dL — ABNORMAL LOW (ref 30.0–36.0)
MCV: 94.2 fL (ref 80.0–100.0)
Monocytes Absolute: 0.4 10*3/uL (ref 0.1–1.0)
Monocytes Relative: 4 %
Neutro Abs: 7.6 10*3/uL (ref 1.7–7.7)
Neutrophils Relative %: 85 %
Platelets: 251 10*3/uL (ref 150–400)
RBC: 5.01 MIL/uL (ref 3.87–5.11)
RDW: 19.9 % — ABNORMAL HIGH (ref 11.5–15.5)
WBC: 8.9 10*3/uL (ref 4.0–10.5)
nRBC: 0 % (ref 0.0–0.2)

## 2022-12-12 LAB — FERRITIN: Ferritin: 78 ng/mL (ref 11–307)

## 2022-12-12 LAB — IRON AND TIBC
Iron: 353 ug/dL — ABNORMAL HIGH (ref 28–170)
Saturation Ratios: 74 % — ABNORMAL HIGH (ref 10.4–31.8)
TIBC: 480 ug/dL — ABNORMAL HIGH (ref 250–450)
UIBC: 127 ug/dL

## 2022-12-12 MED ORDER — PREGABALIN 75 MG PO CAPS: 75.0000 mg | ORAL_CAPSULE | Freq: Three times a day (TID) | ORAL | 2 refills | Status: DC

## 2022-12-12 MED ORDER — PREGABALIN 50 MG PO CAPS: 50.0000 mg | ORAL_CAPSULE | Freq: Three times a day (TID) | ORAL | 0 refills | Status: DC

## 2022-12-12 NOTE — Telephone Encounter (Signed)
Dena took care of this.

## 2022-12-12 NOTE — Telephone Encounter (Signed)
Patient needs PA sent in for Lyrica

## 2022-12-12 NOTE — Telephone Encounter (Signed)
Called patient. Script did not transcribe to pharmacy. Reorder request sent to Dr. Dossie Arbour.

## 2022-12-12 NOTE — Telephone Encounter (Signed)
Patient called again to see if Dr. Dossie Arbour has called out her medicine. The pharmacy states they didn't get the script for Lyrica. They got the oxycodone. Please call in Lyrica.

## 2022-12-13 MED FILL — Iron Sucrose Inj 20 MG/ML (Fe Equiv): INTRAVENOUS | Qty: 10 | Status: AC

## 2022-12-14 ENCOUNTER — Inpatient Hospital Stay: Payer: Medicare HMO

## 2022-12-14 ENCOUNTER — Encounter: Payer: Self-pay | Admitting: Oncology

## 2022-12-14 ENCOUNTER — Inpatient Hospital Stay (HOSPITAL_BASED_OUTPATIENT_CLINIC_OR_DEPARTMENT_OTHER): Payer: Medicare HMO | Admitting: Oncology

## 2022-12-14 VITALS — BP 97/60 | HR 92 | Temp 97.9°F | Resp 18 | Wt 215.4 lb

## 2022-12-14 DIAGNOSIS — D500 Iron deficiency anemia secondary to blood loss (chronic): Secondary | ICD-10-CM | POA: Diagnosis not present

## 2022-12-14 DIAGNOSIS — D509 Iron deficiency anemia, unspecified: Secondary | ICD-10-CM | POA: Diagnosis not present

## 2022-12-14 NOTE — Progress Notes (Signed)
Hematology/Oncology Consult note [phone] [phone]) [phone]  CHIEF COMPLAINTS/REASON FOR VISIT:  Iron deficiency anemia  ASSESSMENT & PLAN:   Iron deficiency anemia due to chronic blood loss Labs reviewed and discussed with patient. Both hemoglobin and iron panel have improved. I will hold off additional IV Venofer treatment at this point.  Continue oral iron supplementation.  Orders Placed This Encounter  Procedures   CBC with Differential/Platelet    Standing Status:   Future    Standing Expiration Date:   12/14/2023   Iron and TIBC    Standing Status:   Future    Standing Expiration Date:   12/15/2023   Ferritin    Standing Status:   Future    Standing Expiration Date:   12/15/2023  6 months follow-up.  All questions were answered. The patient knows to call the clinic with any problems, questions or concerns.  Earlie Server, MD, PhD [facility] Hematology Oncology 12/14/2022     HISTORY OF PRESENTING ILLNESS:  Theresa [name] is a  67 y.o.  female with PMH listed below who was referred to me for anemia Reviewed patient's recent labs that was done.  She was found to have anemia since September 2023,  acute blood loss anemia Hb 7.4. She was on Xarelto for A Fib and Aspirin at that time, anticoagulation was held. She was transfused with PRBCs She underwent EGD, fount to have gastritis, non bleeding duodenal ulcers. Recently she follows up with Dr.Toledo and had repeat blood work on 09/19/22, which still showed persistent anemia with hemoglobin of 7.1, 09/27/2022 Ferritin 19, TIBC 578,  She has chronic respiratory failure due to pulmonary hypertension, on nasal cannula oxygen, follows ups with pulmonology.    INTERVAL HISTORY Theresa [name] is a 67 y.o. female who has above history reviewed by me today presents for follow up visit for iron deficiency anemia. During interval, she has had epistaxis, currently off Xarelto. She tolerated IV Venofer treatments.  Fatigue  level has not significantly improved.   MEDICAL HISTORY:  Past Medical History:  Diagnosis Date   Acute postoperative pain 10/03/2017   Anxiety    BiPAP (biphasic positive airway pressure) dependence    at hs   Carpal tunnel syndrome    CHF (congestive heart failure) ([place])    1/18   CHF (congestive heart failure) ([facility])    Chronic generalized abdominal pain    Chronic rhinitis    COPD (chronic obstructive pulmonary disease) ([facility])    DDD (degenerative disc disease), cervical    DDD (degenerative disc disease), lumbosacral    Depression    Dyspnea    Edema    Flu    1/18   Gastritis    GERD (gastroesophageal reflux disease)    Hematuria    Hernia of abdominal wall 07/17/2018   Hernia, abdominal    Hypertension    Kidney stones    Low back pain    Lumbar radiculitis    Malodorous urine    Muscle weakness    Obesity    Oxygen dependent    Pneumonia due to COVID-19 virus 02/03/2020   Renal cyst    Sensory urge incontinence    Thyroid activity decreased    9/19   Tobacco abuse    Wheezing     SURGICAL HISTORY: Past Surgical History:  Procedure Laterality Date   ABDOMINAL HYSTERECTOMY     CARDIOVERSION N/A 04/29/2022   Procedure: CARDIOVERSION;  Surgeon: Corey Skains, MD;  Location: [facility] ORS;  Service: Cardiovascular;  Laterality: N/A;   CARPAL TUNNEL RELEASE Left 2012   cataract surgery Bilateral    EPIGASTRIC HERNIA REPAIR N/A 08/13/2018   HERNIA REPAIR EPIGASTRIC ADULT; polypropylene mesh reinforcement.  Surgeon: Robert Bellow, MD;  Location: [facility] ORS;  Service: General;  Laterality: N/A;   ESOPHAGOGASTRODUODENOSCOPY N/A 06/21/2022   Procedure: ESOPHAGOGASTRODUODENOSCOPY (EGD);  Surgeon: Toledo, Benay Pike, MD;  Location: [facility] ENDOS[facility];  Service: Gastroenterology;  Laterality: N/A;   RIGHT/LEFT HEART CATH AND CORONARY ANGIOGRAPHY N/A 07/11/2019   Procedure: RIGHT/LEFT HEART CATH AND CORONARY ANGIOGRAPHY;  Surgeon: Yolonda Kida, MD;  Location: [facility];  Service: Cardiovascular;  Laterality: N/A;   TEE WITHOUT CARDIOVERSION N/A 04/29/2022   Procedure: TRANSESOPHAGEAL ECHOCARDIOGRAM (TEE);  Surgeon: Corey Skains, MD;  Location: [facility] OR[facility];  Service: Cardiovascular;  Laterality: N/A;   TONSILLECTOMY     TUBAL LIGATION     UMBILICAL HERNIA REPAIR N/A 08/13/2018   HERNIA REPAIR UMBILICAL ADULT; polypropylene mesh reinforcement Surgeon: Robert Bellow, MD;  Location: [facility] ORS;  Service: General;  Laterality: N/A;    SOCIAL HISTORY: Social History   Socioeconomic History   Marital status: Married    Spouse name: Ovid Curd   Number of children: 2   Years of education: Not on file   Highest education level: 12th grade  Occupational History   Occupation: disability  Tobacco Use   Smoking status: Some Days    Packs/day: 1.50    Years: 44.00    Total pack years: 66.00    Types: Cigarettes   Smokeless tobacco: Never   Tobacco comments:    over a half pack  Vaping Use   Vaping Use: Never used  Substance and Sexual Activity   Alcohol use: No   Drug use: Yes    Comment: prescribed hydrocodone   Sexual activity: Not Currently    Birth control/protection: Surgical  Other Topics Concern   Not on file  Social History Narrative   ** Merged History Encounter **       Lives with spouse    Social Determinants of Health   Financial Resource Strain: Low Risk  (09/20/2021)   Overall Financial Resource Strain (CARDIA)    Difficulty of Paying Living Expenses: Not very hard  Food Insecurity: No Food Insecurity (11/08/2022)   Hunger Vital Sign    Worried About Running Out of Food in the Last Year: Never true    Ran Out of Food in the Last Year: Never true  Transportation Needs: No Transportation Needs (11/08/2022)   PRAPARE - Hydrologist (Medical): No    Lack of Transportation (Non-Medical): No  Physical Activity: Inactive (09/20/2021)   Exercise Vital Sign    Days of Exercise per Week: 0  days    Minutes of Exercise per Session: 0 min  Stress: No Stress Concern Present (09/20/2021)   [place]    Feeling of Stress : Not at all  Social Connections: Moderately Integrated (09/20/2021)   Social Connection and Isolation Panel [NHANES]    Frequency of Communication with Friends and Family: More than three times a week    Frequency of Social Gatherings with Friends and Family: Once a week    Attends Religious Services: More than 4 times per year    Active Member of [employer] or Organizations: No    Attends Archivist Meetings: Never    Marital Status: Married  Human resources officer Violence: Not At Risk (11/08/2022)  Humiliation, Afraid, Rape, and Kick questionnaire    Fear of Current or Ex-Partner: No    Emotionally Abused: No    Physically Abused: No    Sexually Abused: No    FAMILY HISTORY: Family History  Problem Relation Age of Onset   Heart disease Mother    Stroke Mother    Coronary artery disease Mother    Alcohol abuse Father    Heart disease Father    Lung cancer Sister    Cancer Sister    Breast cancer Sister    Cancer Brother    Cancer Brother    Kidney cancer Brother    Pneumonia Brother    Prostate cancer Neg Hx    Bladder Cancer Neg Hx     ALLERGIES:  is allergic to gabapentin, codeine, and meloxicam.  MEDICATIONS:  Current Outpatient Medications  Medication Sig Dispense Refill   acetaminophen (TYLENOL) 500 MG tablet Take 1,000 mg by mouth every 8 (eight) hours as needed for mild pain or headache.     albuterol (VENTOLIN HFA) 108 (90 Base) MCG/ACT inhaler Inhale 2 puffs into the lungs every 6 (six) hours as needed for wheezing or shortness of breath. 8 g 1   amiodarone (PACERONE) 200 MG tablet Take 1 tablet (200 mg total) by mouth daily. 30 tablet 0   buPROPion (WELLBUTRIN SR) 150 MG 12 hr tablet Take 150 mg by mouth daily.     busPIRone (BUSPAR) 5 MG tablet TAKE 1 TABLET BY  MOUTH TWICE DAILY 180 tablet 1   butalbital-acetaminophen-caffeine (FIORICET) 50-325-40 MG tablet Take 1 tablet by mouth every 6 (six) hours as needed for headache. LIMIT USE TO PREVENT MEDICATION OVERUSE HEADACHE 14 tablet 0   Cholecalciferol 50 MCG (2000 UT) CAPS Take 2,000 Units by mouth daily.      dapagliflozin propanediol (FARXIGA) 10 MG TABS tablet Take 1 tablet (10 mg total) by mouth daily before breakfast. 30 tablet 5   diclofenac Sodium (VOLTAREN) 1 % GEL Apply 2 g topically 4 (four) times daily as needed (lower back and/or shoulder pain).     diltiazem (CARDIZEM CD) 180 MG 24 hr capsule Take 180 mg by mouth daily.     ferrous sulfate 325 (65 FE) MG EC tablet Take 1 tablet (325 mg total) by mouth 2 (two) times daily with a meal. (Patient taking differently: Take 325 mg by mouth daily with breakfast.) 60 tablet 3   FUROSCIX 80 MG/10ML CTKT Inject into the skin.     HYDROcodone-acetaminophen (NORCO/VICODIN) 5-325 MG tablet Take 1 tablet by mouth every 8 (eight) hours as needed for severe pain. Must last 30 days 90 tablet 0   [START ON 12/30/2022] HYDROcodone-acetaminophen (NORCO/VICODIN) 5-325 MG tablet Take 1 tablet by mouth every 8 (eight) hours as needed for severe pain. Must last 30 days 90 tablet 0   ipratropium-albuterol (DUONEB) 0.5-2.5 (3) MG/3ML SOLN USE 1 VIAL VIA NEBULIZER EVERY 6 HOURS AS NEEDED FOR SHORTNESS OF BREATH OR WHEEZING 1080 mL 0   melatonin 5 MG TABS Take 5 mg by mouth at bedtime.     omeprazole (PRILOSEC) 40 MG capsule Take 40 mg by mouth daily.     oxyCODONE-acetaminophen (PERCOCET) 5-325 MG tablet Take 1 tablet by mouth every 8 (eight) hours as needed for up to 7 days for severe pain. Must last 7 days. 21 tablet 0   polyethylene glycol (MIRALAX / GLYCOLAX) 17 g packet Take 17 g by mouth daily as needed for mild constipation or moderate constipation.  potassium chloride SA (KLOR-CON M) 20 MEQ tablet Take 2 tablets every morning and 1 tablet every evening 270 tablet  3   pregabalin (LYRICA) 75 MG capsule Take 1 capsule (75 mg total) by mouth 3 (three) times daily. 90 capsule 2   roflumilast (DALIRESP) 500 MCG TABS tablet Take 500 mcg by mouth daily.     sertraline (ZOLOFT) 100 MG tablet TAKE 2 TABLETS BY MOUTH ONCE EVERY MORNING 180 tablet 0   simvastatin (ZOCOR) 20 MG tablet Take 20 mg by mouth at bedtime.     spironolactone (ALDACTONE) 25 MG tablet Take 25 mg by mouth daily.     torsemide (DEMADEX) 20 MG tablet Take 40-80 mg by mouth See admin instructions. Take 4 tablets ('80mg'$ ) by mouth every morning and take 2 tablets ('40mg'$ ) by mouth every afternoon     varenicline (CHANTIX) 1 MG tablet Take 1 mg by mouth 2 (two) times daily.     Fluticasone-Umeclidin-Vilant 100-62.5-25 MCG/INH AEPB Inhale 1 puff into the lungs daily. (Patient not taking: Reported on 12/14/2022)     HYDROcodone-acetaminophen (NORCO/VICODIN) 5-325 MG tablet Take 1 tablet by mouth every 8 (eight) hours as needed for severe pain. Must last 30 days 90 tablet 0   IRON SUCROSE IV Inject 200 mg into the vein once a week. (Patient not taking: Reported on 12/14/2022)     naloxone [facility]) nasal spray 4 mg/0.1 mL Place 1 spray into the nose as needed for up to 365 doses (for opioid-induced respiratory depresssion). In case of emergency (overdose), spray once into each nostril. If no response within 3 minutes, repeat application and call [id number]. (Patient not taking: Reported on 12/14/2022) 1 each 0   No current facility-administered medications for this visit.    Review of Systems  Constitutional:  Positive for fatigue. Negative for chills and fever.  HENT:   Negative for hearing loss and voice change.   Eyes:  Negative for eye problems.  Respiratory:  Positive for shortness of breath. Negative for chest tightness and cough.   Cardiovascular:  Negative for chest pain.  Gastrointestinal:  Negative for abdominal distention, abdominal pain and blood in stool.  Endocrine: Negative for hot flashes.   Genitourinary:  Negative for difficulty urinating and frequency.   Musculoskeletal:  Negative for arthralgias.  Skin:  Negative for itching and rash.  Neurological:  Negative for extremity weakness.  Hematological:  Negative for adenopathy. Does not bruise/bleed easily.  Psychiatric/Behavioral:  Negative for confusion.     PHYSICAL EXAMINATION: Vitals:   12/14/22 1441  BP: 97/60  Pulse: 92  Resp: 18  Temp: 97.9 F (36.6 C)  SpO2: 94%   Filed Weights   12/14/22 1441  Weight: 215 lb 6.4 oz (97.7 kg)    Physical Exam Constitutional:      General: She is not in acute distress.    Appearance: She is obese.     Comments: Patient sits in the wheelchair  HENT:     Head: Normocephalic and atraumatic.  Eyes:     General: No scleral icterus. Cardiovascular:     Rate and Rhythm: Normal rate.  Pulmonary:     Effort: Pulmonary effort is normal. No respiratory distress.     Breath sounds: No wheezing.     Comments: Patient breathes comfortably via nasal cannula oxygen.  Decreased breath sound bilaterally Abdominal:     General: Bowel sounds are normal. There is no distension.     Palpations: Abdomen is soft.  Musculoskeletal:  General: No deformity. Normal range of motion.     Cervical back: Normal range of motion and neck supple.  Skin:    General: Skin is warm and dry.     Findings: No erythema or rash.  Neurological:     Mental Status: She is alert and oriented to person, place, and time. Mental status is at baseline.     Cranial Nerves: No cranial nerve deficit.  Psychiatric:        Mood and Affect: Mood normal.      LABORATORY DATA:  I have reviewed the data as listed    Latest Ref Rng & Units 12/12/2022   11:19 AM 11/09/2022   10:33 AM 11/08/2022    4:39 AM  CBC  WBC 4.0 - 10.5 K/uL 8.9  13.2  11.1   Hemoglobin 12.0 - 15.0 g/dL 13.4  8.5  7.8   Hematocrit 36.0 - 46.0 % 47.2  32.2  29.0   Platelets 150 - 400 K/uL 251  277  258       Latest Ref Rng &  Units 11/08/2022    4:39 AM 11/07/2022   11:37 AM 11/06/2022    4:44 AM  CMP  Glucose 70 - 99 mg/dL 126  98  141   BUN 8 - 23 mg/dL '12  13  14   '$ Creatinine 0.44 - 1.00 mg/dL 0.71  0.84  0.71   Sodium 135 - 145 mmol/L 139  143  137   Potassium 3.5 - 5.1 mmol/L 3.9  3.4  3.7   Chloride 98 - 111 mmol/L 102  103  103   CO2 22 - 32 mmol/L '31  30  27   '$ Calcium 8.9 - 10.3 mg/dL 8.6  8.9  8.1       Component Value Date/Time   IRON 353 (H) 12/12/2022 1119   TIBC 480 (H) 12/12/2022 1119   FERRITIN 78 12/12/2022 1119   IRONPCTSAT 74 (H) 12/12/2022 1119     RADIOGRAPHIC STUDIES: I have personally reviewed the radiological images as listed and agreed with the findings in the report. DG PAIN CLINIC C-ARM 1-60 MIN NO REPORT  Result Date: 12/01/2022 Fluoro was used, but no Radiologist interpretation will be provided. Please refer to "NOTES" tab for provider progress note.

## 2022-12-14 NOTE — Progress Notes (Signed)
Pt here for follow up. No new concerns voiced.   

## 2022-12-14 NOTE — Assessment & Plan Note (Signed)
Labs reviewed and discussed with patient. Both hemoglobin and iron panel have improved. I will hold off additional IV Venofer treatment at this point.  Continue oral iron supplementation.

## 2022-12-15 ENCOUNTER — Telehealth: Payer: Self-pay | Admitting: Pain Medicine

## 2022-12-15 ENCOUNTER — Other Ambulatory Visit: Payer: Self-pay

## 2022-12-15 DIAGNOSIS — G8918 Other acute postprocedural pain: Secondary | ICD-10-CM

## 2022-12-15 MED ORDER — OXYCODONE-ACETAMINOPHEN 5-325 MG PO TABS: 1.0000 | ORAL_TABLET | Freq: Three times a day (TID) | ORAL | 0 refills | Status: AC | PRN

## 2022-12-15 NOTE — Telephone Encounter (Signed)
Will need an appt

## 2022-12-15 NOTE — Telephone Encounter (Signed)
Having severe pain, Lyrica doesn't seem to be helping. Any suggestions how to help pain. Please call patient

## 2022-12-19 ENCOUNTER — Encounter: Payer: Self-pay | Admitting: Pain Medicine

## 2022-12-19 ENCOUNTER — Ambulatory Visit: Payer: Medicare HMO | Attending: Pain Medicine | Admitting: Pain Medicine

## 2022-12-19 VITALS — BP 120/61 | HR 82 | Temp 98.0°F | Resp 20 | Ht 62.0 in | Wt 212.0 lb

## 2022-12-19 DIAGNOSIS — G8918 Other acute postprocedural pain: Secondary | ICD-10-CM | POA: Diagnosis not present

## 2022-12-19 DIAGNOSIS — M549 Dorsalgia, unspecified: Secondary | ICD-10-CM | POA: Diagnosis present

## 2022-12-19 DIAGNOSIS — M545 Low back pain, unspecified: Secondary | ICD-10-CM | POA: Diagnosis not present

## 2022-12-19 DIAGNOSIS — M5459 Other low back pain: Secondary | ICD-10-CM | POA: Diagnosis present

## 2022-12-19 DIAGNOSIS — G8929 Other chronic pain: Secondary | ICD-10-CM | POA: Insufficient documentation

## 2022-12-19 MED ORDER — TRIAMCINOLONE ACETONIDE 40 MG/ML IJ SUSP
40.0000 mg | Freq: Once | INTRAMUSCULAR | Status: AC
  Administered 2022-12-19: 40 mg
  Filled 2022-12-19: qty 1

## 2022-12-19 MED ORDER — LIDOCAINE HCL 2 % IJ SOLN
20.0000 mL | Freq: Once | INTRAMUSCULAR | Status: AC
  Administered 2022-12-19: 400 mg
  Filled 2022-12-19: qty 20

## 2022-12-19 MED ORDER — PENTAFLUOROPROP-TETRAFLUOROETH EX AERO
INHALATION_SPRAY | Freq: Once | CUTANEOUS | Status: AC
  Administered 2022-12-19: 1 via TOPICAL
  Filled 2022-12-19: qty 116

## 2022-12-19 MED ORDER — ROPIVACAINE HCL 2 MG/ML IJ SOLN
9.0000 mL | Freq: Once | INTRAMUSCULAR | Status: AC
  Administered 2022-12-19: 9 mL
  Filled 2022-12-19: qty 20

## 2022-12-19 NOTE — Progress Notes (Signed)
PROVIDER NOTE: Interpretation of information contained herein should be left to medically-trained personnel. Specific patient instructions are provided elsewhere under "Patient Instructions" section of medical record. This document was created in part using STT-dictation technology, any transcriptional errors that may result from this process are unintentional.  Patient: Theresa [name] Search Type: Established DOB: 10/30/1955 MRN: [id number] PCP: Juluis Pitch, MD  Service: Procedure DOS: 12/19/2022 Setting: Ambulatory Location: Ambulatory outpatient facility Delivery: Face-to-face Provider: Gaspar Cola, MD Specialty: Interventional Pain Management Specialty designation: 09 Location: Outpatient facility Ref. Prov.: Juluis Pitch, MD       Interventional Therapy   Primary Reason for Visit: Interventional Pain Management Treatment. CC: Back Pain (More right side)    Procedure:          Anesthesia, Analgesia, Anxiolysis:  Type: Erector spinae muscle and quadratus lumborum muscle Trigger Point Injection (1-2 muscle groups) #1  CPT: 20552 Primary Purpose: Therapeutic Region: Lower Lumbosacral Level: Sacral Target Area: Erector Spinae muscle and quadratus lumbar ROM muscle Trigger Point Approach: Percutaneous, ipsilateral approach. Laterality: Right        Type: Local Anesthesia Local Anesthetic: Lidocaine 1-2% Sedation: None  Indication(s):  Analgesia Route: Infiltration ([place]/IM) IV Access: N/A   Position: Prone   1. Chronic low back pain (1ry area of Pain) (Bilateral) (R>L) w/o sciatica   2. Acute exacerbation of chronic low back pain   3. Acute postoperative pain   4. Trigger point with back pain   5. Lumbar trigger point syndrome    NAS-11 Pain score:   Pre-procedure: 10-Worst pain ever/10   Post-procedure: 6 /10     Pre-op H&P Assessment:  Theresa [name] is a 67 y.o. (year old), female patient, seen today for interventional treatment. She  has a past surgical history  that includes Abdominal hysterectomy; Tonsillectomy; Carpal tunnel release (Left, 2012); Tubal ligation; epigastric hernia repair (N/A, 08/13/2018); Umbilical hernia repair (N/A, 08/13/2018); RIGHT/LEFT HEART CATH AND CORONARY ANGIOGRAPHY (N/A, 07/11/2019); TEE without cardioversion (N/A, 04/29/2022); Cardioversion (N/A, 04/29/2022); Esophagogastroduodenoscopy (N/A, 06/21/2022); and cataract surgery (Bilateral). Theresa [name] has a current medication list which includes the following prescription(s): acetaminophen, albuterol, amiodarone, bupropion, buspirone, butalbital-acetaminophen-caffeine, cholecalciferol, dapagliflozin propanediol, diclofenac sodium, diltiazem, ferrous sulfate, fluticasone-umeclidin-vilant, furoscix, hydrocodone-acetaminophen, [START ON 12/30/2022] hydrocodone-acetaminophen, ipratropium-albuterol, iron sucrose, melatonin, naloxone, omeprazole, oxycodone-acetaminophen, polyethylene glycol, potassium chloride sa, pregabalin, roflumilast, sertraline, simvastatin, spironolactone, torsemide, and varenicline. Her primarily concern today is the Back Pain (More right side)  Initial Vital Signs:  Pulse/HCG Rate: 82  Temp: 98 F (36.7 C) Resp: 20 BP: 120/61 SpO2: 91 % (patient is on 3L of o2 at all times.)  BMI: Estimated body mass index is 38.78 kg/m as calculated from the following:   Height as of this encounter: '5\' 2"'$  (1.575 m).   Weight as of this encounter: 212 lb (96.2 kg).  Risk Assessment: Allergies: Reviewed. She is allergic to gabapentin, codeine, and meloxicam.  Allergy Precautions: None required Coagulopathies: Reviewed. None identified.  Blood-thinner therapy: None at this time Active Infection(s): Reviewed. None identified. Theresa [name] is afebrile  Site Confirmation: Theresa [name] was asked to confirm the procedure and laterality before marking the site Procedure checklist: Completed Consent: Before the procedure and under the influence of no sedative(s), amnesic(s), or  anxiolytics, the patient was informed of the treatment options, risks and possible complications. To fulfill our ethical and legal obligations, as recommended by the American Medical Association's Code of Ethics, I have informed the patient of my clinical impression; the nature and purpose of the treatment or procedure; the risks, benefits,  and possible complications of the intervention; the alternatives, including doing nothing; the risk(s) and benefit(s) of the alternative treatment(s) or procedure(s); and the risk(s) and benefit(s) of doing nothing. The patient was provided information about the general risks and possible complications associated with the procedure. These may include, but are not limited to: failure to achieve desired goals, infection, bleeding, organ or nerve damage, allergic reactions, paralysis, and death. In addition, the patient was informed of those risks and complications associated to the procedure, such as failure to decrease pain; infection; bleeding; organ or nerve damage with subsequent damage to sensory, motor, and/or autonomic systems, resulting in permanent pain, numbness, and/or weakness of one or several areas of the body; allergic reactions; (i.e.: anaphylactic reaction); and/or death. Furthermore, the patient was informed of those risks and complications associated with the medications. These include, but are not limited to: allergic reactions (i.e.: anaphylactic or anaphylactoid reaction(s)); adrenal axis suppression; blood sugar elevation that in diabetics may result in ketoacidosis or comma; water retention that in patients with history of congestive heart failure may result in shortness of breath, pulmonary edema, and decompensation with resultant heart failure; weight gain; swelling or edema; medication-induced neural toxicity; particulate matter embolism and blood vessel occlusion with resultant organ, and/or nervous system infarction; and/or aseptic necrosis of one or  more joints. Finally, the patient was informed that Medicine is not an exact science; therefore, there is also the possibility of unforeseen or unpredictable risks and/or possible complications that may result in a catastrophic outcome. The patient indicated having understood very clearly. We have given the patient no guarantees and we have made no promises. Enough time was given to the patient to ask questions, all of which were answered to the patient's satisfaction. Ms. Wiegel has indicated that she wanted to continue with the procedure. Attestation: I, the ordering provider, attest that I have discussed with the patient the benefits, risks, side-effects, alternatives, likelihood of achieving goals, and potential problems during recovery for the procedure that I have provided informed consent. Date  Time: 12/19/2022  2:22 PM  Pre-Procedure Preparation:  Monitoring: As per clinic protocol. Respiration, ETCO2, SpO2, BP, heart rate and rhythm monitor placed and checked for adequate function Safety Precautions: Patient was assessed for positional comfort and pressure points before starting the procedure. Time-out: I initiated and conducted the "Time-out" before starting the procedure, as per protocol. The patient was asked to participate by confirming the accuracy of the "Time Out" information. Verification of the correct person, site, and procedure were performed and confirmed by me, the nursing staff, and the patient. "Time-out" conducted as per Joint Commission's Universal Protocol (UP.01.01.01). Time: 0253  Description of Procedure:          Area Prepped: Entire Lower Lumbosacral Region DuraPrep (Iodine Povacrylex [0.7% available iodine] and Isopropyl Alcohol, 74% w/w) Safety Precautions: Aspiration looking for blood return was conducted prior to all injections. At no point did we inject any substances, as a needle was being advanced. No attempts were made at seeking any paresthesias. Safe injection  practices and needle disposal techniques used. Medications properly checked for expiration dates. SDV (single dose vial) medications used. Description of the Procedure: Protocol guidelines were followed. The patient was placed in position over the fluoroscopy table. The target area was identified and the area prepped in the usual manner. Skin & deeper tissues infiltrated with local anesthetic. Appropriate amount of time allowed to pass for local anesthetics to take effect. The procedure needles were then advanced to the target area.  Proper needle placement secured. Negative aspiration confirmed. Solution injected in intermittent fashion, asking for systemic symptoms every 0.5cc of injectate. The needles were then removed and the area cleansed, making sure to leave some of the prepping solution back to take advantage of its long term bactericidal properties.  Vitals:   12/19/22 1417  BP: 120/61  Pulse: 82  Resp: 20  Temp: 98 F (36.7 C)  TempSrc: Temporal  SpO2: 91%  Weight: 212 lb (96.2 kg)  Height: '5\' 2"'$  (1.575 m)    Start Time: 0253 hrs. End Time: 0254 hrs. Materials:  Needle(s) Type: Regular needle Gauge: 22G Length: 1.5-in Medication(s): Please see orders for medications and dosing details.  Imaging Guidance:          Type of Imaging Technique: None used Indication(s): N/A Exposure Time: No patient exposure Contrast: None used. Fluoroscopic Guidance: N/A Ultrasound Guidance: N/A Interpretation: N/A  Antibiotic Prophylaxis:   Anti-infectives (From admission, onward)    None      Indication(s): None identified  Post-operative Assessment:  Post-procedure Vital Signs:  Pulse/HCG Rate: 82  Temp: 98 F (36.7 C) Resp: 20 BP: 120/61 SpO2: 91 % (patient is on 3L of o2 at all times.)  EBL: None  Complications: No immediate post-treatment complications observed by team, or reported by patient.  Note: The patient tolerated the entire procedure well. A repeat set of  vitals were taken after the procedure and the patient was kept under observation following institutional policy, for this type of procedure. Post-procedural neurological assessment was performed, showing return to baseline, prior to discharge. The patient was provided with post-procedure discharge instructions, including a section on how to identify potential problems. Should any problems arise concerning this procedure, the patient was given instructions to immediately contact us, at any time, without hesitation. In any case, we plan to contact the patient by telephone for a follow-up status report regarding this interventional procedure.  Comments:  No additional relevant information.  Plan of Care (POC)  Orders:  Orders Placed This Encounter  Procedures   TRIGGER POINT INJECTION    Scheduling Instructions:     Area: Lower Back     Side: Right     Sedation: Patient's choice.     Timeframe: Today    Order Specific Question:   Where will this procedure be performed?    Answer:   [facility]   Informed Consent Details: Physician/Practitioner Attestation; Transcribe to consent form and obtain patient signature    Provider Attestation: I, [place] Dossie Arbour, MD, (Pain Management Specialist), the physician/practitioner, attest that I have discussed with the patient the benefits, risks, side effects, alternatives, likelihood of achieving goals and potential problems during recovery for the procedure that I have provided informed consent.    Scheduling Instructions:     Note: Always confirm laterality of pain with Ms. Alwyn Ren, before procedure.     Transcribe to consent form and obtain patient signature.    Order Specific Question:   Physician/Practitioner attestation of informed consent for procedure/surgical case    Answer:   I, the physician/practitioner, attest that I have discussed with the patient the benefits, risks, side effects, alternatives, likelihood of achieving goals and  potential problems during recovery for the procedure that I have provided informed consent.    Order Specific Question:   Procedure    Answer:   Myoneural Block (Trigger Point injection)    Order Specific Question:   Physician/Practitioner performing the procedure    Answer:   Connie Hilgert A.  Dossie Arbour MD    Order Specific Question:   Indication/Reason    Answer:   Musculoskeletal pain/myofascial pain secondary to trigger point   Provide equipment / supplies at bedside    Procedure tray: "Block Tray" (Disposable  single use) Skin infiltration needle: Regular 1.5-in, 25-G, (x1) Block Needle type: Regular Amount/quantity: 1 Size: Short(1.5-inch) Gauge: (25G x1) + (22G x1)    Standing Status:   Standing    Number of Occurrences:   1    Order Specific Question:   Specify    Answer:   Block Tray   Tens Unit    Duration: Lifetime of Treatment Treatment Area: Back DX Codes: M54.50, G89.29, M54.59    Scheduling Instructions:     Please complete and fax a "Prescription & Letter of Medical Necessity" for an "E-Stim" Zynex NexWave + Monthly Supplies. Arrange for patient to meet with company representative for instructions on use.   Chronic Opioid Analgesic:  Hydrocodone/APAP 5/325 one tablet every 8 hours (15 mg/day of hydrocodone) MME/day: 15 mg/day.   Medications ordered for procedure: Meds ordered this encounter  Medications   lidocaine (XYLOCAINE) 2 % (with pres) injection 400 mg   pentafluoroprop-tetrafluoroeth (GEBAUERS) aerosol   triamcinolone acetonide (KENALOG-40) injection 40 mg   ropivacaine (PF) 2 mg/mL (0.2%) (NAROPIN) injection 9 mL   Medications administered: We administered lidocaine, pentafluoroprop-tetrafluoroeth, triamcinolone acetonide, and ropivacaine (PF) 2 mg/mL (0.2%).  See the medical record for exact dosing, route, and time of administration.  Follow-up plan:   Return for scheduled encounter, (PPE).       Interventional Therapies  Risk  Complexity  Considerations:   NOTE: NO further Lumbar RFA until BMI <35.   Planned  Pending:   Therapeutic right (L4-5, L5-S1) lumbar facet RFA (L3, L4, L5, and S1 medial branch) #2 (last done on 10/03/2017) (pending clearance to stop Xarelto x 3 days)   Under consideration:   Possible left lumbar facet RFA #2 (last done on 2018)  Diagnostic bilateral cervical facet MBB #1  Palliative left L2-3 LESI #4    Completed:   Palliative right lumbar facet MBB x7 (09/20/2022) (100/100/100x5days/0)  Palliative left lumbar facet MBB x6 (11/11/2021) (100/100/0/0)  Therapeutic left L2-3 LESI x3 (09/23/2021) (80/80/80)  Diagnostic/palliative left L4 TFESI x1 (12/26/2017) (100/100/50/>50)  Diagnostic/palliative left L4-5 LESI x2 (07/30/2019) (100/100/0/0)  Diagnostic/palliative right L4 TFESI x1 (01/25/2018) (100/100/50/50)  Diagnostic/palliative right L4-5 LESI x1 (01/25/2018) (100/100/50/50)  Palliative left lumbar facet RFA x1 (06/27/2017) (0/0/100/>50)  Palliative right lumbar facet RFA x1 (10/03/2017) (100/100/75)    Therapeutic  Palliative (PRN) options:   Therapeutic lumbar facet MBB   Therapeutic left L2 LESI  Therapeutic lumbar facet RFA     Pharmacotherapy  Nonopioids transferred 08/03/2020: Gabapentin and baclofen       Recent Visits Date Type Provider Dept  12/01/22 Procedure visit Milinda Pointer, MD [facility]  10/26/22 Office Visit Milinda Pointer, MD [facility]  10/04/22 Office Visit Milinda Pointer, MD [facility]  09/20/22 Procedure visit Milinda Pointer, MD [facility]  Showing recent visits within past 90 days and meeting all other requirements Today's Visits Date Type Provider Dept  12/19/22 Office Visit Milinda Pointer, MD [facility]  Showing today's visits and meeting all other requirements Future Appointments Date Type Provider Dept  12/27/22 Appointment Milinda Pointer, MD [facility]   01/25/23 Appointment Milinda Pointer, MD [facility]  Showing future appointments within next 90 days and meeting all other requirements  Disposition: Discharge home  Discharge (Date  Time): 12/19/2022; 1501 hrs.   Primary Care Physician: Juluis Pitch, MD Location: [facility] Note by: Gaspar Cola, MD (TTS technology used. I apologize for any typographical errors that were not detected and corrected.) Date: 12/19/2022; Time: 3:46 PM  Disclaimer:  Medicine is not an Chief Strategy Officer. The only guarantee in medicine is that nothing is guaranteed. It is important to note that the decision to proceed with this intervention was based on the information collected from the patient. The Data and conclusions were drawn from the patient's questionnaire, the interview, and the physical examination. Because the information was provided in large part by the patient, it cannot be guaranteed that it has not been purposely or unconsciously manipulated. Every effort has been made to obtain as much relevant data as possible for this evaluation. It is important to note that the conclusions that lead to this procedure are derived in large part from the available data. Always take into account that the treatment will also be dependent on availability of resources and existing treatment guidelines, considered by other Pain Management Practitioners as being common knowledge and practice, at the time of the intervention. For Medico-Legal purposes, it is also important to point out that variation in procedural techniques and pharmacological choices are the acceptable norm. The indications, contraindications, technique, and results of the above procedure should only be interpreted and judged by a Board-Certified Interventional Pain Specialist with extensive familiarity and expertise in the same exact procedure and technique.

## 2022-12-19 NOTE — Progress Notes (Signed)
PROVIDER NOTE: Information contained herein reflects review and annotations entered in association with encounter. Interpretation of such information and data should be left to medically-trained personnel. Information provided to patient can be located elsewhere in the medical record under "Patient Instructions". Document created using STT-dictation technology, any transcriptional errors that may result from process are unintentional.    Patient: Theresa [name]  Service Category: E/M  Provider: Gaspar Cola, MD  DOB: 26-Nov-1955  DOS: 12/19/2022  Referring Provider: Juluis Pitch, MD  MRN: [id number]  Specialty: Interventional Pain Management  PCP: Juluis Pitch, MD  Type: Established Patient  Setting: Ambulatory outpatient    Location: Office  Delivery: Face-to-face     HPI  [name], a 67 y.o. year old female, is here today because of her Chronic bilateral low back pain without sciatica [M54.50, G89.29]. [name] primary complain today is Back Pain (More right side) Last encounter: My last encounter with her was on 12/15/2022. Pertinent problems: [name] has Neuritis or radiculitis due to rupture of lumbar intervertebral disc; DDD (degenerative disc disease), lumbar; Carpal tunnel syndrome; Neurogenic pain; Neuropathic pain; Musculoskeletal pain; Lumbar spondylosis (L4-5); Chronic low back pain (1ry area of Pain) (Bilateral) (R>L) w/o sciatica; Chronic lower extremity pain (2ry area of Pain) (Left); Chronic lumbar radicular pain (L4 & S1 Dermatome) (Left); Chronic shoulder pain (Right side); Chronic upper extremity pain (Right-sided); Cervical radiculitis (Right side); Abnormal x-ray of lumbar spine; Chronic pain syndrome (significant psychosocial component); 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; 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); Lumbar facet syndrome (Bilateral) (R>L); Chronic sacroiliac joint pain (Bilateral); Lumbar facet hypertrophy  (multilevel); Lumbar spinal stenosis (L4-5); 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); 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); Lumbosacral radiculopathy at L4; Chronic shoulder pain (Left); Arthralgia of acromioclavicular joint (Right); Acromioclavicular joint DJD (Right); Osteoarthritis of shoulder (Right); Chronic hip pain (3ry area of Pain) (Bilateral) (L>R); Chronic lower extremity pain (Bilateral) (R>L); Cervicalgia (Bilateral) (R>L); Spondylosis without myelopathy or radiculopathy, lumbosacral region; Chronic knee pain (Left); Chronic left-sided lumbar radiculopathy; Abnormal MRI, lumbar spine (12/09/2021, 07/23/2019); Lumbar disc extrusion (L2-3) (Left); Chronic shoulder pain (Bilateral); Abnormal MRI, cervical spine; Trigger point with back pain; and Lumbar trigger point syndrome on their pertinent problem list. Pain Assessment: Severity of Chronic pain is reported as a 10-Worst pain ever/10. Location: Back Left, Right/pain radiaties down her right buttock, back of her right thigh. Onset: More than a month ago. Quality: Aching, Burning, Constant. Timing: Constant. Modifying factor(s): nothing. Vitals:  height is '5\' 2"'$  (1.575 m) and weight is 212 lb (96.2 kg). Her temporal temperature is 98 F (36.7 C). Her blood pressure is 120/61 and her pulse is 82. Her respiration is 20 and oxygen saturation is 91%.  BMI: Estimated body mass index is 38.78 kg/m as calculated from the following:   Height as of this encounter: '5\' 2"'$  (1.575 m).   Weight as of this encounter: 212 lb (96.2 kg).  Reason for encounter: patient-requested evaluation.  The patient appears to have developed an acute trigger point s/p radiofrequency ablation.  This seems to be in the area of the paravertebral muscles close to the PSIS on the right side.  She has exquisite tenderness to palpation with reproduction of the referred pain towards the buttocks and the back of the leg.  Today I have offered the patient a  trigger point injection to see if we can deactivate this and hopefully have her enjoy the true benefits of the radiofrequency ablation.  The patient  was informed of my assessment and plan and she has agreed to proceed with a trigger point injection.  Pharmacotherapy Assessment  Analgesic: Hydrocodone/APAP 5/325 one tablet every 8 hours (15 mg/day of hydrocodone) MME/day: 15 mg/day.   Monitoring: [place] PMP: PDMP reviewed during this encounter.       Pharmacotherapy: No side-effects or adverse reactions reported. Compliance: No problems identified. Effectiveness: Clinically acceptable.  No notes on file  No results found for: "CBDTHCR" No results found for: "D8THCCBX" No results found for: "D9THCCBX"  UDS:  Summary  Date Value Ref Range Status  10/26/2022 Note  Final    Comment:    ==================================================================== [employer] Select 13 (MW) ==================================================================== Specimen Alert Note: Urinary creatinine is low; ability to detect some drugs may be compromised. Interpret results with caution. (Creatinine) ==================================================================== Test                             Result       Flag       Units  Drug Present and Declared for Prescription Verification   Hydrocodone                    3780         EXPECTED   ng/mg creat   Norhydrocodone                 1400         EXPECTED   ng/mg creat    Sources of hydrocodone include scheduled prescription medications.    Norhydrocodone is an expected metabolite of hydrocodone.  Drug Absent but Declared for Prescription Verification   Butalbital                     Not Detected UNEXPECTED ==================================================================== Test                      Result    Flag   Units      Ref Range   Creatinine              10        LL     mg/dL       >=20 ==================================================================== Declared Medications:  The flagging and interpretation on this report are based on the  following declared medications.  Unexpected results may arise from  inaccuracies in the declared medications.   **Note: The testing scope of this panel includes these medications:   Butalbital (Fioricet)  Hydrocodone (Norco)   **Note: The testing scope of this panel does not include the  following reported medications:   Acetaminophen (Tylenol)  Acetaminophen (Fioricet)  Acetaminophen (Norco)  Albuterol (Ventolin HFA)  Albuterol (Duoneb)  Amiodarone  Buspirone (Buspar)  Caffeine (Fioricet)  Cholecalciferol  Dapagliflozin (Farxiga)  Diclofenac (Voltaren)  Diltiazem (Cardizem)  Fluticasone (Trelegy)  Helium  Ipratropium (Duoneb)  Iron  Metolazone (Zaroxolyn)  Naloxone (Narcan)  Oxygen  Pantoprazole (Protonix)  Potassium (Klor-Con)  Rivaroxaban (Xarelto)  Roflumilast (Daliresp)  Sertraline (Zoloft)  Simvastatin (Zocor)  Spironolactone (Aldactone)  Torsemide (Demadex)  Umeclidinium (Trelegy)  Varenicline (Chantix)  Vilanterol (Trelegy) ==================================================================== For clinical consultation, please call [phone]. ====================================================================       ROS  Constitutional: Denies any fever or chills Gastrointestinal: No reported hemesis, hematochezia, vomiting, or acute GI distress Musculoskeletal: Denies any acute onset joint swelling, redness, loss of ROM, or weakness Neurological: No reported episodes of acute onset apraxia, aphasia, dysarthria, agnosia, amnesia, paralysis,  loss of coordination, or loss of consciousness  Medication Review  Cholecalciferol, Fluticasone-Umeclidin-Vilant, Furosemide, HYDROcodone-acetaminophen, Iron Sucrose, acetaminophen, albuterol, amiodarone, buPROPion, busPIRone,  butalbital-acetaminophen-caffeine, dapagliflozin propanediol, diclofenac Sodium, diltiazem, ferrous sulfate, ipratropium-albuterol, melatonin, naloxone, omeprazole, oxyCODONE-acetaminophen, polyethylene glycol, potassium chloride SA, pregabalin, roflumilast, sertraline, simvastatin, spironolactone, torsemide, and varenicline  History Review  Allergy: Ms. Kintner is allergic to gabapentin, codeine, and meloxicam. Drug: Ms. Dubel  reports current drug use. Alcohol:  reports no history of alcohol use. Tobacco:  reports that she has been smoking cigarettes. She has a 66.00 pack-year smoking history. She has never used smokeless tobacco. Social: Ms. Upton  reports that she has been smoking cigarettes. She has a 66.00 pack-year smoking history. She has never used smokeless tobacco. She reports current drug use. She reports that she does not drink alcohol. Medical:  has a past medical history of Acute postoperative pain (10/03/2017), Anxiety, BiPAP (biphasic positive airway pressure) dependence, Carpal tunnel syndrome, CHF (congestive heart failure) ([place]), CHF (congestive heart failure) ([place]), Chronic generalized abdominal pain, Chronic rhinitis, COPD (chronic obstructive pulmonary disease) ([place]), DDD (degenerative disc disease), cervical, DDD (degenerative disc disease), lumbosacral, Depression, Dyspnea, Edema, Flu, Gastritis, GERD (gastroesophageal reflux disease), Hematuria, Hernia of abdominal wall (07/17/2018), Hernia, abdominal, Hypertension, Kidney stones, Low back pain, Lumbar radiculitis, Malodorous urine, Muscle weakness, Obesity, Oxygen dependent, Pneumonia due to COVID-19 virus (02/03/2020), Renal cyst, Sensory urge incontinence, Thyroid activity decreased, Tobacco abuse, and Wheezing. Surgical: Ms. Hornbacher  has a past surgical history that includes Abdominal hysterectomy; Tonsillectomy; Carpal tunnel release (Left, 2012); Tubal ligation; epigastric hernia repair (N/A, 08/13/2018); Umbilical hernia  repair (N/A, 08/13/2018); RIGHT/LEFT HEART CATH AND CORONARY ANGIOGRAPHY (N/A, 07/11/2019); TEE without cardioversion (N/A, 04/29/2022); Cardioversion (N/A, 04/29/2022); Esophagogastroduodenoscopy (N/A, 06/21/2022); and cataract surgery (Bilateral). Family: family history includes Alcohol abuse in her father; Breast cancer in her sister; Cancer in her brother, brother, and sister; Coronary artery disease in her mother; Heart disease in her father and mother; Kidney cancer in her brother; Lung cancer in her sister; Pneumonia in her brother; Stroke in her mother.  Laboratory Chemistry Profile   Renal Lab Results  Component Value Date   BUN 12 11/08/2022   CREATININE 0.71 11/08/2022   BCR 32 (H) 04/14/2022   GFRAA >60 06/26/2020   GFRNONAA >60 11/08/2022    Hepatic Lab Results  Component Value Date   AST 23 11/05/2022   ALT 18 11/05/2022   ALBUMIN 3.5 11/05/2022   ALKPHOS 82 11/05/2022   LIPASE 57 (H) 06/19/2022   AMMONIA 40 (H) 10/11/2019    Electrolytes Lab Results  Component Value Date   NA 139 11/08/2022   K 3.9 11/08/2022   CL 102 11/08/2022   CALCIUM 8.6 (L) 11/08/2022   MG 2.8 (H) 11/08/2022   PHOS 3.2 06/21/2022    Bone Lab Results  Component Value Date   25OHVITD1 13 (L) 09/17/2015   25OHVITD2 <1.0 09/17/2015   25OHVITD3 13 09/17/2015    Inflammation (CRP: Acute Phase) (ESR: Chronic Phase) Lab Results  Component Value Date   CRP 0.9 04/02/2022   ESRSEDRATE 17 04/06/2022   LATICACIDVEN 0.9 04/01/2022         Note: Above Lab results reviewed.  Recent Imaging Review  DG PAIN CLINIC C-ARM 1-60 MIN NO REPORT Fluoro was used, but no Radiologist interpretation will be provided.  Please refer to "NOTES" tab for provider progress note. Note: Reviewed        Physical Exam  General appearance: Well nourished, well developed, and well hydrated. In no apparent acute  distress Mental status: Alert, oriented x 3 (person, place, & time)       Respiratory: No evidence  of acute respiratory distress Eyes: PERLA Vitals: BP 120/61 (BP Location: Left Arm, Patient Position: Sitting, Cuff Size: Large)   Pulse 82   Temp 98 F (36.7 C) (Temporal)   Resp 20   Ht '5\' 2"'$  (1.575 m)   Wt 212 lb (96.2 kg)   SpO2 91% Comment: patient is on 3L of o2 at all times.  BMI 38.78 kg/m  BMI: Estimated body mass index is 38.78 kg/m as calculated from the following:   Height as of this encounter: '5\' 2"'$  (1.575 m).   Weight as of this encounter: 212 lb (96.2 kg). Ideal: Ideal body weight: 50.1 kg (110 lb 7.2 oz) Adjusted ideal body weight: 68.5 kg (151 lb 1.1 oz)  Assessment   Diagnosis Status  1. Chronic low back pain (1ry area of Pain) (Bilateral) (R>L) w/o sciatica   2. Acute exacerbation of chronic low back pain   3. Acute postoperative pain   4. Trigger point with back pain   5. Lumbar trigger point syndrome    Having a Flare-up Having a Flare-up Having a Flare-up   Updated Problems: Problem  Trigger Point With Back Pain  Lumbar Trigger Point Syndrome  Anemia Due to Chronic Blood Loss  Duodenal Ulcer Disease  Medication Adverse Effect, Initial Encounter  Epistaxis, Recurrent  History of Hemoptysis  History of Rsv Infection    Plan of Care  Problem-specific:  No problem-specific Assessment & Plan notes found for this encounter.  Ms. Jhanae Rathbun has a current medication list which includes the following long-term medication(s): albuterol, amiodarone, ferrous sulfate, hydrocodone-acetaminophen, [START ON 12/30/2022] hydrocodone-acetaminophen, ipratropium-albuterol, iron sucrose, potassium chloride sa, pregabalin, and sertraline.  Pharmacotherapy (Medications Ordered): Meds ordered this encounter  Medications   lidocaine (XYLOCAINE) 2 % (with pres) injection 400 mg   pentafluoroprop-tetrafluoroeth (GEBAUERS) aerosol   triamcinolone acetonide (KENALOG-40) injection 40 mg   ropivacaine (PF) 2 mg/mL (0.2%) (NAROPIN) injection 9 mL   Orders:  Orders  Placed This Encounter  Procedures   TRIGGER POINT INJECTION    Scheduling Instructions:     Area: Lower Back     Side: Right     Sedation: Patient's choice.     Timeframe: Today    Order Specific Question:   Where will this procedure be performed?    Answer:   [facility]   Informed Consent Details: Physician/Practitioner Attestation; Transcribe to consent form and obtain patient signature    Provider Attestation: I, [place] Dossie Arbour, MD, (Pain Management Specialist), the physician/practitioner, attest that I have discussed with the patient the benefits, risks, side effects, alternatives, likelihood of achieving goals and potential problems during recovery for the procedure that I have provided informed consent.    Scheduling Instructions:     Note: Always confirm laterality of pain with Ms. Alwyn Ren, before procedure.     Transcribe to consent form and obtain patient signature.    Order Specific Question:   Physician/Practitioner attestation of informed consent for procedure/surgical case    Answer:   I, the physician/practitioner, attest that I have discussed with the patient the benefits, risks, side effects, alternatives, likelihood of achieving goals and potential problems during recovery for the procedure that I have provided informed consent.    Order Specific Question:   Procedure    Answer:   Myoneural Block (Trigger Point injection)    Order Specific Question:   Physician/Practitioner performing  the procedure    Answer:   Ari Engelbrecht A. Dossie Arbour MD    Order Specific Question:   Indication/Reason    Answer:   Musculoskeletal pain/myofascial pain secondary to trigger point   Provide equipment / supplies at bedside    Procedure tray: "Block Tray" (Disposable  single use) Skin infiltration needle: Regular 1.5-in, 25-G, (x1) Block Needle type: Regular Amount/quantity: 1 Size: Short(1.5-inch) Gauge: (25G x1) + (22G x1)    Standing Status:   Standing    Number of  Occurrences:   1    Order Specific Question:   Specify    Answer:   Block Tray   Tens Unit    Duration: Lifetime of Treatment Treatment Area: Back DX Codes: M54.50, G89.29, M54.59    Scheduling Instructions:     Please complete and fax a "Prescription & Letter of Medical Necessity" for an "E-Stim" Zynex NexWave + Monthly Supplies. Arrange for patient to meet with company representative for instructions on use.   Follow-up plan:   No follow-ups on file.      Interventional Therapies  Risk  Complexity Considerations:   NOTE: NO further Lumbar RFA until BMI <35.   Planned  Pending:   Therapeutic right (L4-5, L5-S1) lumbar facet RFA (L3, L4, L5, and S1 medial branch) #2 (last done on 10/03/2017) (pending clearance to stop Xarelto x 3 days)   Under consideration:   Possible left lumbar facet RFA #2 (last done on 2018)  Diagnostic bilateral cervical facet MBB #1  Palliative left L2-3 LESI #4    Completed:   Palliative right lumbar facet MBB x7 (09/20/2022) (100/100/100x5days/0)  Palliative left lumbar facet MBB x6 (11/11/2021) (100/100/0/0)  Therapeutic left L2-3 LESI x3 (09/23/2021) (80/80/80)  Diagnostic/palliative left L4 TFESI x1 (12/26/2017) (100/100/50/>50)  Diagnostic/palliative left L4-5 LESI x2 (07/30/2019) (100/100/0/0)  Diagnostic/palliative right L4 TFESI x1 (01/25/2018) (100/100/50/50)  Diagnostic/palliative right L4-5 LESI x1 (01/25/2018) (100/100/50/50)  Palliative left lumbar facet RFA x1 (06/27/2017) (0/0/100/>50)  Palliative right lumbar facet RFA x1 (10/03/2017) (100/100/75)    Therapeutic  Palliative (PRN) options:   Therapeutic lumbar facet MBB   Therapeutic left L2 LESI  Therapeutic lumbar facet RFA     Pharmacotherapy  Nonopioids transferred 08/03/2020: Gabapentin and baclofen       Recent Visits Date Type Provider Dept  12/01/22 Procedure visit Milinda Pointer, MD [facility]  10/26/22 Office Visit Milinda Pointer, MD [facility]  10/04/22 Office Visit Milinda Pointer, MD [facility]  09/20/22 Procedure visit Milinda Pointer, MD [facility]  Showing recent visits within past 90 days and meeting all other requirements Today's Visits Date Type Provider Dept  12/19/22 Office Visit Milinda Pointer, MD [facility]  Showing today's visits and meeting all other requirements Future Appointments Date Type Provider Dept  12/27/22 Appointment Milinda Pointer, MD [facility]  01/25/23 Appointment Milinda Pointer, MD [facility]  Showing future appointments within next 90 days and meeting all other requirements  I discussed the assessment and treatment plan with the patient. The patient was provided an opportunity to ask questions and all were answered. The patient agreed with the plan and demonstrated an understanding of the instructions.  Patient advised to call back or seek an in-person evaluation if the symptoms or condition worsens.  Duration of encounter: 30 minutes.  Total time on encounter, as per AMA guidelines included both the face-to-face and non-face-to-face time personally spent by the physician and/or other qualified health care professional(s) on the day of  the encounter (includes time in activities that require the physician or other qualified health care professional and does not include time in activities normally performed by clinical staff). Physician's time may include the following activities when performed: Preparing to see the patient (e.g., pre-charting review of records, searching for previously ordered imaging, lab work, and nerve conduction tests) Review of prior analgesic pharmacotherapies. Reviewing PMP Interpreting ordered tests (e.g., lab work, imaging, nerve conduction tests) Performing post-procedure evaluations, including interpretation of diagnostic procedures Obtaining and/or reviewing separately obtained  history Performing a medically appropriate examination and/or evaluation Counseling and educating the patient/family/caregiver Ordering medications, tests, or procedures Referring and communicating with other health care professionals (when not separately reported) Documenting clinical information in the electronic or other health record Independently interpreting results (not separately reported) and communicating results to the patient/ family/caregiver Care coordination (not separately reported)  Note by: Gaspar Cola, MD Date: 12/19/2022; Time: 2:48 PM

## 2022-12-21 DIAGNOSIS — J9610 Chronic respiratory failure, unspecified whether with hypoxia or hypercapnia: Secondary | ICD-10-CM | POA: Diagnosis not present

## 2022-12-26 ENCOUNTER — Telehealth: Payer: Self-pay | Admitting: Student in an Organized Health Care Education/Training Program

## 2022-12-26 NOTE — Telephone Encounter (Signed)
PT stated that she is still in pain ,will like for a nurse to give her a call. TY

## 2022-12-26 NOTE — Telephone Encounter (Signed)
Patient has procedure appt tomorrow.

## 2022-12-26 NOTE — Telephone Encounter (Signed)
Attempted to call patient.  LM for her to call office.

## 2022-12-27 ENCOUNTER — Ambulatory Visit
Admission: RE | Admit: 2022-12-27 | Discharge: 2022-12-27 | Disposition: A | Discharge status: Home / Self Care | Payer: Medicare HMO | Source: Ambulatory Visit | Attending: Pain Medicine | Admitting: Pain Medicine

## 2022-12-27 ENCOUNTER — Ambulatory Visit: Payer: Medicare HMO | Attending: Pain Medicine | Admitting: Pain Medicine

## 2022-12-27 ENCOUNTER — Encounter: Payer: Self-pay | Admitting: Pain Medicine

## 2022-12-27 VITALS — BP 146/79 | HR 78 | Temp 97.1°F | Resp 19 | Ht 62.0 in | Wt 207.0 lb

## 2022-12-27 DIAGNOSIS — M549 Dorsalgia, unspecified: Secondary | ICD-10-CM | POA: Diagnosis present

## 2022-12-27 DIAGNOSIS — M5136 Other intervertebral disc degeneration, lumbar region: Secondary | ICD-10-CM | POA: Diagnosis present

## 2022-12-27 DIAGNOSIS — M47817 Spondylosis without myelopathy or radiculopathy, lumbosacral region: Secondary | ICD-10-CM | POA: Diagnosis not present

## 2022-12-27 DIAGNOSIS — M47816 Spondylosis without myelopathy or radiculopathy, lumbar region: Secondary | ICD-10-CM | POA: Insufficient documentation

## 2022-12-27 DIAGNOSIS — G8918 Other acute postprocedural pain: Secondary | ICD-10-CM | POA: Diagnosis present

## 2022-12-27 DIAGNOSIS — G8929 Other chronic pain: Secondary | ICD-10-CM | POA: Insufficient documentation

## 2022-12-27 DIAGNOSIS — Z7901 Long term (current) use of anticoagulants: Secondary | ICD-10-CM | POA: Diagnosis present

## 2022-12-27 DIAGNOSIS — M545 Low back pain, unspecified: Secondary | ICD-10-CM | POA: Diagnosis present

## 2022-12-27 MED ORDER — OXYCODONE-ACETAMINOPHEN 5-325 MG PO TABS: 1.0000 | ORAL_TABLET | Freq: Three times a day (TID) | ORAL | 0 refills | Status: AC | PRN

## 2022-12-27 MED ORDER — FENTANYL CITRATE (PF) 100 MCG/2ML IJ SOLN
25.0000 ug | INTRAMUSCULAR | Status: DC | PRN
  Administered 2022-12-27: 50 ug via INTRAVENOUS

## 2022-12-27 MED ORDER — MIDAZOLAM HCL 5 MG/5ML IJ SOLN
INTRAMUSCULAR | Status: AC
  Filled 2022-12-27: qty 5

## 2022-12-27 MED ORDER — ROPIVACAINE HCL 2 MG/ML IJ SOLN
INTRAMUSCULAR | Status: AC
  Filled 2022-12-27: qty 20

## 2022-12-27 MED ORDER — PENTAFLUOROPROP-TETRAFLUOROETH EX AERO
INHALATION_SPRAY | Freq: Once | CUTANEOUS | Status: DC
  Filled 2022-12-27: qty 116

## 2022-12-27 MED ORDER — TRIAMCINOLONE ACETONIDE 40 MG/ML IJ SUSP
40.0000 mg | Freq: Once | INTRAMUSCULAR | Status: AC
  Administered 2022-12-27: 40 mg

## 2022-12-27 MED ORDER — LACTATED RINGERS IV SOLN: Freq: Once | INTRAVENOUS | Status: AC

## 2022-12-27 MED ORDER — TRIAMCINOLONE ACETONIDE 40 MG/ML IJ SUSP
INTRAMUSCULAR | Status: AC
  Filled 2022-12-27: qty 1

## 2022-12-27 MED ORDER — ROPIVACAINE HCL 2 MG/ML IJ SOLN
9.0000 mL | Freq: Once | INTRAMUSCULAR | Status: AC
  Administered 2022-12-27: 9 mL via PERINEURAL

## 2022-12-27 MED ORDER — LIDOCAINE HCL 2 % IJ SOLN
20.0000 mL | Freq: Once | INTRAMUSCULAR | Status: AC
  Administered 2022-12-27: 400 mg

## 2022-12-27 MED ORDER — FENTANYL CITRATE (PF) 100 MCG/2ML IJ SOLN
INTRAMUSCULAR | Status: AC
  Filled 2022-12-27: qty 2

## 2022-12-27 MED ORDER — MIDAZOLAM HCL 5 MG/5ML IJ SOLN
0.5000 mg | Freq: Once | INTRAMUSCULAR | Status: AC
  Administered 2022-12-27: 2 mg via INTRAVENOUS

## 2022-12-27 MED ORDER — LIDOCAINE HCL 2 % IJ SOLN
INTRAMUSCULAR | Status: AC
  Filled 2022-12-27: qty 20

## 2022-12-27 NOTE — Patient Instructions (Signed)
___________________________________________________________________________________________  Post-Radiofrequency (RF) Discharge Instructions  You have just completed a Radiofrequency Neurotomy.  The following instructions will provide you with information and guidelines for self-care upon discharge.  If at any time you have questions or concerns please call your physician. DO NOT DRIVE YOURSELF!!  Instructions:  Apply ice: Fill a plastic sandwich bag with crushed ice. Cover it with a small towel and apply to injection site. Apply for 15 minutes then remove x 15 minutes. Repeat sequence on day of procedure, until you go to bed. The purpose is to minimize swelling and discomfort after procedure.  Apply heat: Apply heat to procedure site starting the day following the procedure. The purpose is to treat any soreness and discomfort from the procedure.  Food intake: No eating limitations, unless stipulated above.  Nevertheless, if you have had sedation, you may experience some nausea.  In this case, it may be wise to wait at least two hours prior to resuming regular diet.  Physical activities: Keep activities to a minimum for the first 8 hours after the procedure. For the first 24 hours after the procedure, do not drive a motor vehicle,  Operate heavy machinery, power tools, or handle any weapons.  Consider walking with the use of an assistive device or accompanied by an adult for the first 24 hours.  Do not drink alcoholic beverages including beer.  Do not make any important decisions or sign any legal documents. Go home and rest today.  Resume activities tomorrow, as tolerated.  Use caution in moving about as you may experience mild leg weakness.  Use caution in cooking, use of household electrical appliances and climbing steps.  Driving: If you have received any sedation, you are not allowed to drive for 24 hours after your procedure.  Blood thinner: Restart your blood thinner 6 hours after your  procedure. (Only for those taking blood thinners)  Insulin: As soon as you can eat, you may resume your normal dosing schedule. (Only for those taking insulin)  Medications: May resume pre-procedure medications.  Do not take any drugs, other than what has been prescribed to you.  Infection prevention: Keep procedure site clean and dry.  Post-procedure Pain Diary: Extremely important that this be done correctly and accurately. Recorded information will be used to determine the next step in treatment.  Pain evaluated is that of treated area only. Do not include pain from an untreated area.  Complete every hour, on the hour, for the initial 8 hours. Set an alarm to help you do this part accurately.  Do not go to sleep and have it completed later. It will not be accurate.  Follow-up appointment: Keep your follow-up appointment after the procedure. Usually 2-6 weeks after radiofrequency. Bring you pain diary. The information collected will be essential for your long-term care.   Expect:  From numbing medicine (AKA: Local Anesthetics): Numbness or decrease in pain.  Onset: Full effect within 15 minutes of injected.  Duration: It will depend on the type of local anesthetic used. On the average, 1 to 8 hours.   From steroids (when added): Decrease in swelling or inflammation. Once inflammation is improved, relief of the pain will follow.  Onset of benefits: Depends on the amount of swelling present. The more swelling, the longer it will take for the benefits to be seen. In some cases, up to 10 days.  Duration: Steroids will stay in the system x 2 weeks. Duration of benefits will depend on multiple posibilities including persistent irritating factors.    From procedure: Some discomfort is to be expected once the numbing medicine wears off. In the case of radiofrequency procedures, this may last as long as 6 weeks. Additional post-procedure pain medication is provided for this. Discomfort is  minimized if ice and heat are applied as instructed.  Call if:  You experience numbness and weakness that gets worse with time, as opposed to wearing off.  He experience any unusual bleeding, difficulty breathing, or loss of the ability to control your bowel and bladder. (This applies to Spinal procedures only)  You experience any redness, swelling, heat, red streaks, elevated temperature, fever, or any other signs of a possible infection.  Emergency Numbers:  Durning business hours (Monday - Thursday, 8:00 AM - 4:00 PM) (Friday, 9:00 AM - 12:00 Noon): [phone]  After hours: [phone] ____________________________________________________________________________________________    

## 2022-12-27 NOTE — Progress Notes (Signed)
Safety precautions to be maintained throughout the outpatient stay will include: orient to surroundings, keep bed in low position, maintain call bell within reach at all times, provide assistance with transfer out of bed and ambulation.  

## 2022-12-27 NOTE — Progress Notes (Signed)
PROVIDER NOTE: Interpretation of information contained herein should be left to medically-trained personnel. Specific patient instructions are provided elsewhere under "Patient Instructions" section of medical record. This document was created in part using STT-dictation technology, any transcriptional errors that may result from this process are unintentional.  Patient: Theresa [name] Type: Established DOB: [birth year] MRN: [id number] PCP: Juluis Pitch, MD  Service: Procedure DOS: 12/27/2022 Setting: Ambulatory Location: Ambulatory outpatient facility Delivery: Face-to-face Provider: Gaspar Cola, MD Specialty: Interventional Pain Management Specialty designation: 09 Location: Outpatient facility Ref. Prov.: Juluis Pitch, MD       Interventional Therapy   Procedure: Lumbar Facet, Medial Branch Radiofrequency Ablation (RFA) #2  Laterality: Left (-LT)  Level: L2, L3, L4, L5, and S1 Medial Branch Level(s). These levels will denervate the L3-4, L4-5, and L5-S1 lumbar facet joints.  Imaging: Fluoroscopy-guided         Anesthesia: Local anesthesia (1-2% Lidocaine) Anxiolysis: IV Versed 2.0 mg Sedation: Moderate Sedation Fentanyl 1 mL (50 mcg) DOS: 12/27/2022  Performed by: Gaspar Cola, MD  Purpose: Therapeutic/Palliative Indications: Low back pain severe enough to impact quality of life or function. Indications: 1. Chronic low back pain (1ry area of Pain) (Bilateral) (R>L) w/o sciatica   2. Lumbar facet syndrome (Bilateral) (R>L)   3. Lumbar facet hypertrophy (multilevel)   4. Spondylosis without myelopathy or radiculopathy, lumbosacral region   5. DDD (degenerative disc disease), lumbar   6. Chronic anticoagulation Theresa [name])    Theresa [name] has been dealing with the above chronic pain for longer than three months and has either failed to respond, was unable to tolerate, or simply did not get enough benefit from other more conservative therapies including, but not  limited to: 1. Over-the-counter medications 2. Anti-inflammatory medications 3. Muscle relaxants 4. Membrane stabilizers 5. Opioids 6. Physical therapy and/or chiropractic manipulation 7. Modalities (Heat, ice, etc.) 8. Invasive techniques such as nerve blocks. Theresa [name] has attained more than 50% relief of the pain from a series of diagnostic injections conducted in separate occasions.  Pain Score: Pre-procedure: 7 /10 Post-procedure: 0-No pain/10     Position / Prep / Materials:  Position: Prone  Prep solution: DuraPrep (Iodine Povacrylex [0.7% available iodine] and Isopropyl Alcohol, 74% w/w) Prep Area: Entire Lumbosacral Region (Lower back from mid-thoracic region to end of tailbone and from flank to flank.) Materials:  Tray: RFA (Radiofrequency) tray Needle(s):  Type: RFA (Teflon-coated radiofrequency ablation needles) Gauge (G): 22  Length: Regular (10cm) Qty: 5      Post-procedure evaluation    Procedure:          Anesthesia, Analgesia, Anxiolysis:  Type: Erector spinae muscle and quadratus lumborum muscle Trigger Point Injection (1-2 muscle groups) #1  CPT: 20552 Primary Purpose: Therapeutic Region: Lower Lumbosacral Level: Sacral Target Area: Erector Spinae muscle and quadratus lumbar ROM muscle Trigger Point Approach: Percutaneous, ipsilateral approach. Laterality: Right        Type: Local Anesthesia Local Anesthetic: Lidocaine 1-2% Sedation: None  Indication(s):  Analgesia Route: Infiltration ([place]/IM) IV Access: N/A   Position: Prone   1. Chronic low back pain (1ry area of Pain) (Bilateral) (R>L) w/o sciatica   2. Acute exacerbation of chronic low back pain   3. Acute postoperative pain   4. Trigger point with back pain   5. Lumbar trigger point syndrome    NAS-11 Pain score:   Pre-procedure: 10-Worst pain ever/10   Post-procedure: 6 /10      Effectiveness:  Initial hour after procedure: 100 %. Subsequent 4-6 hours post-procedure:  100  %. Analgesia past initial 6 hours: 100 % (good pain relief only lasted for 1 day.  the pain returned at the same severity). Ongoing improvement:  Analgesic: No long-term benefit Function: Back to baseline ROM: Back to baseline  Pre-op H&P Assessment:  Theresa [name] is a 67 y.o. (year old), female patient, seen today for interventional treatment. She  has a past surgical history that includes Abdominal hysterectomy; Tonsillectomy; Carpal tunnel release (Left, 2012); Tubal ligation; epigastric hernia repair (N/A, 08/13/2018); Umbilical hernia repair (N/A, 08/13/2018); RIGHT/LEFT HEART CATH AND CORONARY ANGIOGRAPHY (N/A, 07/11/2019); TEE without cardioversion (N/A, 04/29/2022); Cardioversion (N/A, 04/29/2022); Esophagogastroduodenoscopy (N/A, 06/21/2022); and cataract surgery (Bilateral). Theresa [name] has a current medication list which includes the following prescription(s): acetaminophen, albuterol, amiodarone, bupropion, buspirone, butalbital-acetaminophen-caffeine, cholecalciferol, dapagliflozin propanediol, diclofenac sodium, diltiazem, ferrous sulfate, fluticasone-umeclidin-vilant, furoscix, hydrocodone-acetaminophen, [START ON 12/30/2022] hydrocodone-acetaminophen, ipratropium-albuterol, melatonin, naloxone, omeprazole, oxycodone-acetaminophen, [START ON 01/03/2023] oxycodone-acetaminophen, polyethylene glycol, potassium chloride sa, pregabalin, roflumilast, sertraline, simvastatin, spironolactone, torsemide, and varenicline, and the following Facility-Administered Medications: fentanyl and pentafluoroprop-tetrafluoroeth. Her primarily concern today is the Back Pain (Left lumbar ) and Other (Right buttocks and down the leg. )  Initial Vital Signs:  Pulse/HCG Rate: 78ECG Heart Rate: 72 (NSR) Temp: 97.8 F (36.6 C) Resp: 16 BP: (!) 118/94 SpO2: 94 % (3 liters)  BMI: Estimated body mass index is 37.86 kg/m as calculated from the following:   Height as of this encounter: '5\' 2"'$  (1.575 m).   Weight as  of this encounter: 207 lb (93.9 kg).  Risk Assessment: Allergies: Reviewed. She is allergic to gabapentin, codeine, and meloxicam.  Allergy Precautions: None required Coagulopathies: Reviewed. None identified.  Blood-thinner therapy: None at this time Active Infection(s): Reviewed. None identified. Ms. Brommer is afebrile  Site Confirmation: Ms. Hrncir was asked to confirm the procedure and laterality before marking the site Procedure checklist: Completed Consent: Before the procedure and under the influence of no sedative(s), amnesic(s), or anxiolytics, the patient was informed of the treatment options, risks and possible complications. To fulfill our ethical and legal obligations, as recommended by the American Medical Association's Code of Ethics, I have informed the patient of my clinical impression; the nature and purpose of the treatment or procedure; the risks, benefits, and possible complications of the intervention; the alternatives, including doing nothing; the risk(s) and benefit(s) of the alternative treatment(s) or procedure(s); and the risk(s) and benefit(s) of doing nothing. The patient was provided information about the general risks and possible complications associated with the procedure. These may include, but are not limited to: failure to achieve desired goals, infection, bleeding, organ or nerve damage, allergic reactions, paralysis, and death. In addition, the patient was informed of those risks and complications associated to Spine-related procedures, such as failure to decrease pain; infection (i.e.: Meningitis, epidural or intraspinal abscess); bleeding (i.e.: epidural hematoma, subarachnoid hemorrhage, or any other type of intraspinal or peri-dural bleeding); organ or nerve damage (i.e.: Any type of peripheral nerve, nerve root, or spinal cord injury) with subsequent damage to sensory, motor, and/or autonomic systems, resulting in permanent pain, numbness, and/or weakness of  one or several areas of the body; allergic reactions; (i.e.: anaphylactic reaction); and/or death. Furthermore, the patient was informed of those risks and complications associated with the medications. These include, but are not limited to: allergic reactions (i.e.: anaphylactic or anaphylactoid reaction(s)); adrenal axis suppression; blood sugar elevation that in diabetics may result in ketoacidosis or comma; water retention that in patients with history of congestive heart failure may result in shortness  of breath, pulmonary edema, and decompensation with resultant heart failure; weight gain; swelling or edema; medication-induced neural toxicity; particulate matter embolism and blood vessel occlusion with resultant organ, and/or nervous system infarction; and/or aseptic necrosis of one or more joints. Finally, the patient was informed that Medicine is not an exact science; therefore, there is also the possibility of unforeseen or unpredictable risks and/or possible complications that may result in a catastrophic outcome. The patient indicated having understood very clearly. We have given the patient no guarantees and we have made no promises. Enough time was given to the patient to ask questions, all of which were answered to the patient's satisfaction. Ms. Wehbe has indicated that she wanted to continue with the procedure. Attestation: I, the ordering provider, attest that I have discussed with the patient the benefits, risks, side-effects, alternatives, likelihood of achieving goals, and potential problems during recovery for the procedure that I have provided informed consent. Date  Time: 12/27/2022  8:21 AM   Pre-Procedure Preparation:  Monitoring: As per clinic protocol. Respiration, ETCO2, SpO2, BP, heart rate and rhythm monitor placed and checked for adequate function Safety Precautions: Patient was assessed for positional comfort and pressure points before starting the procedure. Time-out: I  initiated and conducted the "Time-out" before starting the procedure, as per protocol. The patient was asked to participate by confirming the accuracy of the "Time Out" information. Verification of the correct person, site, and procedure were performed and confirmed by me, the nursing staff, and the patient. "Time-out" conducted as per Joint Commission's Universal Protocol (UP.01.01.01). Time: 0910  Description of Procedure:          Laterality: See above. Levels:  See above. Safety Precautions: Aspiration looking for blood return was conducted prior to all injections. At no point did we inject any substances, as a needle was being advanced. Before injecting, the patient was told to immediately notify me if she was experiencing any new onset of "ringing in the ears, or metallic taste in the mouth". No attempts were made at seeking any paresthesias. Safe injection practices and needle disposal techniques used. Medications properly checked for expiration dates. SDV (single dose vial) medications used. After the completion of the procedure, all disposable equipment used was discarded in the proper designated medical waste containers. Local Anesthesia: Protocol guidelines were followed. The patient was positioned over the fluoroscopy table. The area was prepped in the usual manner. The time-out was completed. The target area was identified using fluoroscopy. A 12-in long, straight, sterile hemostat was used with fluoroscopic guidance to locate the targets for each level blocked. Once located, the skin was marked with an approved surgical skin marker. Once all sites were marked, the skin (epidermis, dermis, and hypodermis), as well as deeper tissues (fat, connective tissue and muscle) were infiltrated with a small amount of a short-acting local anesthetic, loaded on a 10cc syringe with a 25G, 1.5-in  Needle. An appropriate amount of time was allowed for local anesthetics to take effect before proceeding to the  next step. Technical description of process:  Radiofrequency Ablation (RFA) L2 Medial Branch Nerve RFA: The target area for the L2 medial branch is at the junction of the postero-lateral aspect of the superior articular process and the superior, posterior, and medial edge of the transverse process of L3. Under fluoroscopic guidance, a Radiofrequency needle was inserted until contact was made with os over the superior postero-lateral aspect of the pedicular shadow (target area). Sensory and motor testing was conducted to properly adjust the position  of the needle. Once satisfactory placement of the needle was achieved, the numbing solution was slowly injected after negative aspiration for blood. 2.0 mL of the nerve block solution was injected without difficulty or complication. After waiting for at least 3 minutes, the ablation was performed. Once completed, the needle was removed intact. L3 Medial Branch Nerve RFA: The target area for the L3 medial branch is at the junction of the postero-lateral aspect of the superior articular process and the superior, posterior, and medial edge of the transverse process of L4. Under fluoroscopic guidance, a Radiofrequency needle was inserted until contact was made with os over the superior postero-lateral aspect of the pedicular shadow (target area). Sensory and motor testing was conducted to properly adjust the position of the needle. Once satisfactory placement of the needle was achieved, the numbing solution was slowly injected after negative aspiration for blood. 2.0 mL of the nerve block solution was injected without difficulty or complication. After waiting for at least 3 minutes, the ablation was performed. Once completed, the needle was removed intact. L4 Medial Branch Nerve RFA: The target area for the L4 medial branch is at the junction of the postero-lateral aspect of the superior articular process and the superior, posterior, and medial edge of the transverse  process of L5. Under fluoroscopic guidance, a Radiofrequency needle was inserted until contact was made with os over the superior postero-lateral aspect of the pedicular shadow (target area). Sensory and motor testing was conducted to properly adjust the position of the needle. Once satisfactory placement of the needle was achieved, the numbing solution was slowly injected after negative aspiration for blood. 2.0 mL of the nerve block solution was injected without difficulty or complication. After waiting for at least 3 minutes, the ablation was performed. Once completed, the needle was removed intact. L5 Medial Branch Nerve RFA: The target area for the L5 medial branch is at the junction of the postero-lateral aspect of the superior articular process of S1 and the superior, posterior, and medial edge of the sacral ala. Under fluoroscopic guidance, a Radiofrequency needle was inserted until contact was made with os over the superior postero-lateral aspect of the pedicular shadow (target area). Sensory and motor testing was conducted to properly adjust the position of the needle. Once satisfactory placement of the needle was achieved, the numbing solution was slowly injected after negative aspiration for blood. 2.0 mL of the nerve block solution was injected without difficulty or complication. After waiting for at least 3 minutes, the ablation was performed. Once completed, the needle was removed intact. S1 Medial Branch Nerve RFA: The target area for the S1 medial branch is located inferior to the junction of the S1 superior articular process and the L5 inferior articular process, posterior, inferior, and lateral to the 6 o'clock position of the L5-S1 facet joint, just superior to the S1 posterior foramen. Under fluoroscopic guidance, the Radiofrequency needle was advanced until contact was made with os over the Target area. Sensory and motor testing was conducted to properly adjust the position of the needle.  Once satisfactory placement of the needle was achieved, the numbing solution was slowly injected after negative aspiration for blood. 2.0 mL of the nerve block solution was injected without difficulty or complication. After waiting for at least 3 minutes, the ablation was performed. Once completed, the needle was removed intact. Radiofrequency lesioning (ablation):  Radiofrequency Generator: Medtronic AccurianTM AG 1000 RF Generator Sensory Stimulation Parameters: 50 Hz was used to locate & identify the nerve, making sure  that the needle was positioned such that there was no sensory stimulation below 0.3 V or above 0.7 V. Motor Stimulation Parameters: 2 Hz was used to evaluate the motor component. Care was taken not to lesion any nerves that demonstrated motor stimulation of the lower extremities at an output of less than 2.5 times that of the sensory threshold, or a maximum of 2.0 V. Lesioning Technique Parameters: Standard Radiofrequency settings. (Not bipolar or pulsed.) Temperature Settings: 80 degrees C Lesioning time: 60 seconds Intra-operative Compliance: Compliant  Once the entire procedure was completed, the treated area was cleaned, making sure to leave some of the prepping solution back to take advantage of its long term bactericidal properties.    Illustration of the posterior view of the lumbar spine and the posterior neural structures. Laminae of L2 through S1 are labeled. DPRL5, dorsal primary ramus of L5; DPRS1, dorsal primary ramus of S1; DPR3, dorsal primary ramus of L3; FJ, facet (zygapophyseal) joint L3-L4; I, inferior articular process of L4; LB1, lateral branch of dorsal primary ramus of L1; IAB, inferior articular branches from L3 medial branch (supplies L4-L5 facet joint); IBP, intermediate branch plexus; MB3, medial branch of dorsal primary ramus of L3; NR3, third lumbar nerve root; S, superior articular process of L5; SAB, superior articular branches from L4 (supplies L4-5 facet  joint also); TP3, transverse process of L3.  Vitals:   12/27/22 0947 12/27/22 0949 12/27/22 0957 12/27/22 1007  BP: (!) 90/46  104/88 (!) 146/79  Pulse:      Resp: '11  16 19  '$ Temp: (!) 97.3 F (36.3 C)   (!) 97.1 F (36.2 C)  TempSrc: Temporal   Temporal  SpO2: (!) 83% 94% 100% 100%  Weight:      Height:        Start Time: 0910 hrs. End Time:   hrs.  Imaging Guidance (Spinal):          Type of Imaging Technique: Fluoroscopy Guidance (Spinal) Indication(s): Assistance in needle guidance and placement for procedures requiring needle placement in or near specific anatomical locations not easily accessible without such assistance. Exposure Time: Please see nurses notes. Contrast: None used. Fluoroscopic Guidance: I was personally present during the use of fluoroscopy. "Tunnel Vision Technique" used to obtain the best possible view of the target area. Parallax error corrected before commencing the procedure. "Direction-depth-direction" technique used to introduce the needle under continuous pulsed fluoroscopy. Once target was reached, antero-posterior, oblique, and lateral fluoroscopic projection used confirm needle placement in all planes. Images permanently stored in EMR. Interpretation: No contrast injected. I personally interpreted the imaging intraoperatively. Adequate needle placement confirmed in multiple planes. Permanent images saved into the patient's record.  Antibiotic Prophylaxis:   Anti-infectives (From admission, onward)    None      Indication(s): None identified  Post-operative Assessment:  Post-procedure Vital Signs:  Pulse/HCG Rate: 7867 Temp: (!) 97.1 F (36.2 C) Resp: 19 BP: (!) 146/79 SpO2: 100 %  EBL: None  Complications: No immediate post-treatment complications observed by team, or reported by patient.  Note: The patient tolerated the entire procedure well. A repeat set of vitals were taken after the procedure and the patient was kept under  observation following institutional policy, for this type of procedure. Post-procedural neurological assessment was performed, showing return to baseline, prior to discharge. The patient was provided with post-procedure discharge instructions, including a section on how to identify potential problems. Should any problems arise concerning this procedure, the patient was given instructions to immediately contact  us, at any time, without hesitation. In any case, we plan to contact the patient by telephone for a follow-up status report regarding this interventional procedure.  Comments:  No additional relevant information.  Plan of Care (POC)  Orders:  Orders Placed This Encounter  Procedures   Radiofrequency,Lumbar    Scheduling Instructions:     Side(s): Left-sided     Level: L3-4, L4-5, and L5-S1 Facets (L2, L3, L4, L5, and S1 Medial Branch)     Sedation: With Sedation.     Timeframe: Today    Order Specific Question:   Where will this procedure be performed?    Answer:   [facility]   DG [facility] C-ARM 1-60 MIN NO REPORT    Intraoperative interpretation by procedural physician at [place].    Standing Status:   Standing    Number of Occurrences:   1    Order Specific Question:   Reason for exam:    Answer:   Assistance in needle guidance and placement for procedures requiring needle placement in or near specific anatomical locations not easily accessible without such assistance.   Informed Consent Details: Physician/Practitioner Attestation; Transcribe to consent form and obtain patient signature    Nursing Order: Transcribe to consent form and obtain patient signature. Note: Always confirm laterality of pain with Ms. Alwyn Ren, before procedure.    Order Specific Question:   Physician/Practitioner attestation of informed consent for procedure/surgical case    Answer:   I, the physician/practitioner, attest that I have discussed with the patient the benefits, risks,  side effects, alternatives, likelihood of achieving goals and potential problems during recovery for the procedure that I have provided informed consent.    Order Specific Question:   Procedure    Answer:   Lumbar Facet Radiofrequency Ablation    Order Specific Question:   Physician/Practitioner performing the procedure    Answer:   Carollee Nussbaumer A. Dossie Arbour, MD    Order Specific Question:   Indication/Reason    Answer:   Low Back Pain, with our without leg pain, due to Facet Joint Arthralgia (Joint Pain) known as Lumbar Facet Syndrome, secondary to Lumbar, and/or Lumbosacral Spondylosis (Arthritis of the Spine), without myelopathy or radiculopathy (Nerve Damage).   Care order/instruction: Please confirm that the patient has stopped the Xarelto (Rivaroxaban) x 3 days prior to procedure or surgery.    Please confirm that the patient has stopped the Xarelto (Rivaroxaban) x 3 days prior to procedure or surgery.    Standing Status:   Standing    Number of Occurrences:   1   Provide equipment / supplies at bedside    Procedure tray: "Radiofrequency Tray" Additional material: Large hemostat (x1); Small hemostat (x1); Towels (x8); 4x4 sterile sponge pack (x1) Needle type: Teflon-coated Radiofrequency Needle (Disposable  single use) Size: Regular Quantity: 5    Standing Status:   Standing    Number of Occurrences:   1    Order Specific Question:   Specify    Answer:   Radiofrequency Tray   Nursing Instructions:    Please complete this patient's postprocedure evaluation.    Scheduling Instructions:     Please complete this patient's postprocedure evaluation.   Bleeding precautions    Standing Status:   Standing    Number of Occurrences:   1   Chronic Opioid Analgesic:  Hydrocodone/APAP 5/325 one tablet every 8 hours (15 mg/day of hydrocodone) MME/day: 15 mg/day.   Medications ordered for procedure: Meds ordered this encounter  Medications   lidocaine (XYLOCAINE) 2 % (with pres) injection 400 mg    pentafluoroprop-tetrafluoroeth (GEBAUERS) aerosol   lactated ringers infusion   midazolam (VERSED) 5 MG/5ML injection 0.5-2 mg    Make sure Flumazenil is available in the pyxis when using this medication. If oversedation occurs, administer 0.2 mg IV over 15 sec. If after 45 sec no response, administer 0.2 mg again over 1 min; may repeat at 1 min intervals; not to exceed 4 doses (1 mg)   fentaNYL (SUBLIMAZE) injection 25-50 mcg    Make sure Narcan is available in the pyxis when using this medication. In the event of respiratory depression (RR< 8/min): Titrate NARCAN (naloxone) in increments of 0.1 to 0.2 mg IV at 2-3 minute intervals, until desired degree of reversal.   ropivacaine (PF) 2 mg/mL (0.2%) (NAROPIN) injection 9 mL   triamcinolone acetonide (KENALOG-40) injection 40 mg   oxyCODONE-acetaminophen (PERCOCET) 5-325 MG tablet    Sig: Take 1 tablet by mouth every 8 (eight) hours as needed for up to 7 days for severe pain. Must last 7 days.    Dispense:  21 tablet    Refill:  0    For acute post-operative pain. Not to be refilled.  Must last 7 days.   oxyCODONE-acetaminophen (PERCOCET) 5-325 MG tablet    Sig: Take 1 tablet by mouth every 8 (eight) hours as needed for up to 7 days for severe pain. Must last 7 days.    Dispense:  21 tablet    Refill:  0    For acute post-operative pain. Not to be refilled.  Must last 7 days.   Medications administered: We administered lidocaine, lactated ringers, midazolam, fentaNYL, ropivacaine (PF) 2 mg/mL (0.2%), and triamcinolone acetonide.  See the medical record for exact dosing, route, and time of administration.  Follow-up plan:   Return in about 6 weeks (around 02/07/2023) for Proc-day (T,Th), (Face2F), (PPE).       Interventional Therapies  Risk  Complexity Considerations:   NOTE: NO further Lumbar RFA until BMI <35.   Planned  Pending:   Therapeutic right (L4-5, L5-S1) lumbar facet RFA (L3, L4, L5, and S1 medial branch) #2 (last done  on 10/03/2017) (pending clearance to stop Xarelto x 3 days)   Under consideration:   Possible left lumbar facet RFA #2 (last done on 2018)  Diagnostic bilateral cervical facet MBB #1  Palliative left L2-3 LESI #4    Completed:   Palliative right lumbar facet MBB x7 (09/20/2022) (100/100/100x5days/0)  Palliative left lumbar facet MBB x6 (11/11/2021) (100/100/0/0)  Therapeutic left L2-3 LESI x3 (09/23/2021) (80/80/80)  Diagnostic/palliative left L4 TFESI x1 (12/26/2017) (100/100/50/>50)  Diagnostic/palliative left L4-5 LESI x2 (07/30/2019) (100/100/0/0)  Diagnostic/palliative right L4 TFESI x1 (01/25/2018) (100/100/50/50)  Diagnostic/palliative right L4-5 LESI x1 (01/25/2018) (100/100/50/50)  Palliative left lumbar facet RFA x1 (06/27/2017) (0/0/100/>50)  Palliative right lumbar facet RFA x1 (10/03/2017) (100/100/75)    Therapeutic  Palliative (PRN) options:   Therapeutic lumbar facet MBB   Therapeutic left L2 LESI  Therapeutic lumbar facet RFA     Pharmacotherapy  Nonopioids transferred 08/03/2020: Gabapentin and baclofen        Recent Visits Date Type Provider Dept  12/19/22 Office Visit Milinda Pointer, MD [facility]  12/01/22 Procedure visit Milinda Pointer, MD [facility]  10/26/22 Office Visit Milinda Pointer, MD [facility]  10/04/22 Office Visit Milinda Pointer, MD [facility]  Showing recent visits within past 90 days and meeting all other requirements Today's Visits  Date Type Provider Dept  12/27/22 Procedure visit Milinda Pointer, MD [facility]  Showing today's visits and meeting all other requirements Future Appointments Date Type Provider Dept  01/25/23 Appointment Milinda Pointer, MD [facility]  02/07/23 Appointment Milinda Pointer, MD [facility]  Showing future appointments within next 90 days and meeting all other requirements  Disposition: Discharge home   Discharge (Date  Time): 12/27/2022; 1008 hrs.   Primary Care Physician: Juluis Pitch, MD Location: [facility] Note by: Gaspar Cola, MD (TTS technology used. I apologize for any typographical errors that were not detected and corrected.) Date: 12/27/2022; Time: 11:29 AM  Disclaimer:  Medicine is not an Chief Strategy Officer. The only guarantee in medicine is that nothing is guaranteed. It is important to note that the decision to proceed with this intervention was based on the information collected from the patient. The Data and conclusions were drawn from the patient's questionnaire, the interview, and the physical examination. Because the information was provided in large part by the patient, it cannot be guaranteed that it has not been purposely or unconsciously manipulated. Every effort has been made to obtain as much relevant data as possible for this evaluation. It is important to note that the conclusions that lead to this procedure are derived in large part from the available data. Always take into account that the treatment will also be dependent on availability of resources and existing treatment guidelines, considered by other Pain Management Practitioners as being common knowledge and practice, at the time of the intervention. For Medico-Legal purposes, it is also important to point out that variation in procedural techniques and pharmacological choices are the acceptable norm. The indications, contraindications, technique, and results of the above procedure should only be interpreted and judged by a Board-Certified Interventional Pain Specialist with extensive familiarity and expertise in the same exact procedure and technique.

## 2022-12-28 ENCOUNTER — Telehealth: Payer: Self-pay

## 2022-12-28 NOTE — Telephone Encounter (Signed)
Post procedure follow up.  LM 

## 2022-12-29 NOTE — Progress Notes (Deleted)
Established patient visit   Patient: Theresa [name]   DOB: 1956-09-13   67 y.o. Female  MRN: [id number] Visit Date: 12/30/2022  Today's healthcare provider: Lelon Huh, MD   No chief complaint on file.  Subjective    HPI  Anxietyand Depression, Follow-up  She was last seen for anxiety 6 months ago. Changes made at last visit include Recommend addition of buspar to 200 of zoloft 5 mg bid.   She reports {excellent/good/fair/poor:19665} compliance with treatment. She reports {good/fair/poor:18685} tolerance of treatment. She {is/is not:21021397} having side effects. {document side effects if present:1}  She feels her anxiety is {Desc; severity:60313} and {improved/worse/unchanged:3041574} since last visit.  Symptoms: {Yes/No:20286} chest pain {Yes/No:20286} difficulty concentrating  {Yes/No:20286} dizziness {Yes/No:20286} fatigue  {Yes/No:20286} feelings of losing control {Yes/No:20286} insomnia  {Yes/No:20286} irritable {Yes/No:20286} palpitations  {Yes/No:20286} panic attacks {Yes/No:20286} racing thoughts  {Yes/No:20286} shortness of breath {Yes/No:20286} sweating  {Yes/No:20286} tremors/shakes    GAD-7 Results    05/18/2022    2:16 PM 02/01/2022    9:34 AM 01/06/2022   10:39 AM  GAD-7 Generalized Anxiety Disorder Screening Tool  1. Feeling Nervous, Anxious, or on Edge 0 0 0  2. Not Being Able to Stop or Control Worrying 0 0 0  3. Worrying Too Much About Different Things 0 0 0  4. Trouble Relaxing 0 0 0  5. Being So Restless it's Hard To Sit Still 0 0 0  6. Becoming Easily Annoyed or Irritable 0 0 0  7. Feeling Afraid As If Something Awful Might Happen 0 0 0  Total GAD-7 Score 0 0 0  Difficulty At Work, Home, or Getting  Along With Others? Not difficult at all Not difficult at all Not difficult at all    PHQ-9 Scores    12/01/2022    8:55 AM 09/02/2022    1:56 PM 08/03/2022    1:57 PM  PHQ9 SCORE ONLY  PHQ-9 Total Score 1 22 0     ---------------------------------------------------------------------------------------------------   COPD exacerbation Pulmonary HTN: patient followed by pulmonary.    Medications: Outpatient Medications Prior to Visit  Medication Sig   acetaminophen (TYLENOL) 500 MG tablet Take 1,000 mg by mouth every 8 (eight) hours as needed for mild pain or headache.   albuterol (VENTOLIN HFA) 108 (90 Base) MCG/ACT inhaler Inhale 2 puffs into the lungs every 6 (six) hours as needed for wheezing or shortness of breath.   amiodarone (PACERONE) 200 MG tablet Take 1 tablet (200 mg total) by mouth daily.   buPROPion (WELLBUTRIN SR) 150 MG 12 hr tablet Take 150 mg by mouth daily.   busPIRone (BUSPAR) 5 MG tablet TAKE 1 TABLET BY MOUTH TWICE DAILY   butalbital-acetaminophen-caffeine (FIORICET) 50-325-40 MG tablet Take 1 tablet by mouth every 6 (six) hours as needed for headache. LIMIT USE TO PREVENT MEDICATION OVERUSE HEADACHE   Cholecalciferol 50 MCG (2000 UT) CAPS Take 2,000 Units by mouth daily.    dapagliflozin propanediol (FARXIGA) 10 MG TABS tablet Take 1 tablet (10 mg total) by mouth daily before breakfast.   diclofenac Sodium (VOLTAREN) 1 % GEL Apply 2 g topically 4 (four) times daily as needed (lower back and/or shoulder pain).   diltiazem (CARDIZEM CD) 180 MG 24 hr capsule Take 180 mg by mouth daily.   ferrous sulfate 325 (65 FE) MG EC tablet Take 1 tablet (325 mg total) by mouth 2 (two) times daily with a meal. (Patient taking differently: Take 325 mg by mouth daily with breakfast.)   Fluticasone-Umeclidin-Vilant  100-62.5-25 MCG/INH AEPB Inhale 1 puff into the lungs daily.   FUROSCIX 80 MG/10ML CTKT Inject into the skin.   HYDROcodone-acetaminophen (NORCO/VICODIN) 5-325 MG tablet Take 1 tablet by mouth every 8 (eight) hours as needed for severe pain. Must last 30 days   [START ON 12/30/2022] HYDROcodone-acetaminophen (NORCO/VICODIN) 5-325 MG tablet Take 1 tablet by mouth every 8 (eight) hours as  needed for severe pain. Must last 30 days   ipratropium-albuterol (DUONEB) 0.5-2.5 (3) MG/3ML SOLN USE 1 VIAL VIA NEBULIZER EVERY 6 HOURS AS NEEDED FOR SHORTNESS OF BREATH OR WHEEZING   melatonin 5 MG TABS Take 5 mg by mouth at bedtime.   naloxone (NARCAN) nasal spray 4 mg/0.1 mL Place 1 spray into the nose as needed for up to 365 doses (for opioid-induced respiratory depresssion). In case of emergency (overdose), spray once into each nostril. If no response within 3 minutes, repeat application and call [phone].   omeprazole (PRILOSEC) 40 MG capsule Take 40 mg by mouth daily.   oxyCODONE-acetaminophen (PERCOCET) 5-325 MG tablet Take 1 tablet by mouth every 8 (eight) hours as needed for up to 7 days for severe pain. Must last 7 days.   [START ON 01/03/2023] oxyCODONE-acetaminophen (PERCOCET) 5-325 MG tablet Take 1 tablet by mouth every 8 (eight) hours as needed for up to 7 days for severe pain. Must last 7 days.   polyethylene glycol (MIRALAX / GLYCOLAX) 17 g packet Take 17 g by mouth daily as needed for mild constipation or moderate constipation.   potassium chloride SA (KLOR-CON M) 20 MEQ tablet Take 2 tablets every morning and 1 tablet every evening   pregabalin (LYRICA) 75 MG capsule Take 1 capsule (75 mg total) by mouth 3 (three) times daily.   roflumilast (DALIRESP) 500 MCG TABS tablet Take 500 mcg by mouth daily.   sertraline (ZOLOFT) 100 MG tablet TAKE 2 TABLETS BY MOUTH ONCE EVERY MORNING   simvastatin (ZOCOR) 20 MG tablet Take 20 mg by mouth at bedtime.   spironolactone (ALDACTONE) 25 MG tablet Take 25 mg by mouth daily.   torsemide (DEMADEX) 20 MG tablet Take 40-80 mg by mouth See admin instructions. Take 4 tablets ('80mg'$ ) by mouth every morning and take 2 tablets ('40mg'$ ) by mouth every afternoon   varenicline (CHANTIX) 1 MG tablet Take 1 mg by mouth 2 (two) times daily.   No facility-administered medications prior to visit.    Review of Systems  {Labs  Heme  Chem  Endocrine  Serology   Results Review (optional):23779}   Objective    There were no vitals taken for this visit. {Show previous vital signs (optional):23777}  Physical Exam  ***  No results found for any visits on 12/30/22.  Assessment & Plan     ***  No follow-ups on file.      {provider attestation***:1}   Lelon Huh, MD  [place] [phone] (phone) [phone] (fax)  [place]

## 2022-12-30 ENCOUNTER — Ambulatory Visit: Payer: Medicare HMO | Admitting: Family Medicine

## 2022-12-30 ENCOUNTER — Ambulatory Visit: Payer: Self-pay | Admitting: *Deleted

## 2022-12-30 NOTE — Telephone Encounter (Signed)
2nd attempt, Patient called, left VM to return the call to the office to discuss symptoms with a nurse.  

## 2022-12-30 NOTE — Telephone Encounter (Signed)
3rd attempt to reach patient- no answer- left message to call office

## 2022-12-30 NOTE — Telephone Encounter (Signed)
Summary: right side/ back pain   Pt called to reschedule her appt today stated she didn't feel well/ when I asked how she was feeling she stated her right side was bothering her / she had just been seen at pain clinic and they did something with her left side and now her right was bothering her / please advise if needed for ongoing issue     Attempted to call patient-no answer- left message to call office.

## 2023-01-06 DIAGNOSIS — J449 Chronic obstructive pulmonary disease, unspecified: Secondary | ICD-10-CM | POA: Diagnosis not present

## 2023-01-06 DIAGNOSIS — J9610 Chronic respiratory failure, unspecified whether with hypoxia or hypercapnia: Secondary | ICD-10-CM | POA: Diagnosis not present

## 2023-01-07 ENCOUNTER — Other Ambulatory Visit: Payer: Self-pay | Admitting: Pain Medicine

## 2023-01-07 DIAGNOSIS — G8918 Other acute postprocedural pain: Secondary | ICD-10-CM

## 2023-01-10 ENCOUNTER — Telehealth: Payer: Self-pay

## 2023-01-10 ENCOUNTER — Other Ambulatory Visit: Payer: Self-pay | Admitting: Pain Medicine

## 2023-01-10 DIAGNOSIS — M5126 Other intervertebral disc displacement, lumbar region: Secondary | ICD-10-CM

## 2023-01-10 DIAGNOSIS — R937 Abnormal findings on diagnostic imaging of other parts of musculoskeletal system: Secondary | ICD-10-CM

## 2023-01-10 DIAGNOSIS — M5136 Other intervertebral disc degeneration, lumbar region: Secondary | ICD-10-CM

## 2023-01-10 DIAGNOSIS — G8929 Other chronic pain: Secondary | ICD-10-CM

## 2023-01-10 DIAGNOSIS — M51369 Other intervertebral disc degeneration, lumbar region without mention of lumbar back pain or lower extremity pain: Secondary | ICD-10-CM

## 2023-01-10 DIAGNOSIS — M5416 Radiculopathy, lumbar region: Secondary | ICD-10-CM

## 2023-01-10 NOTE — Telephone Encounter (Signed)
She is still in a lot of pain, pain is running down the backside of thigh, buttocks. She is miserable and wants to know what she can do. She can't stand it.

## 2023-01-10 NOTE — Telephone Encounter (Signed)
Patient called stating that she is having pain after her RFA on the left side just like she had after the right side.  Burning and pain going down legs.  States it is going down the side of the thigh on the left.  States she is still having the burning pain on the right side as well.  She was prescribed Lyrica after she started having the pain on the right side but says it has not helped.  Patient did get relief ont he first 4-6 hours after the RFA on the left side.  Message sent to DR Dossie Arbour.

## 2023-01-10 NOTE — Telephone Encounter (Signed)
Spoke with patient and have sent message to Dr Dossie Arbour

## 2023-01-10 NOTE — Telephone Encounter (Signed)
Dr Dossie Arbour ordered a LESI/TFESI.  I called and gave patient instructions with sedation as that was patient choice.  JM notified.

## 2023-01-16 ENCOUNTER — Ambulatory Visit: Payer: Medicare HMO | Admitting: Family Medicine

## 2023-01-16 NOTE — Progress Notes (Deleted)
[place] Ponder Tahirah Sara,acting as a scribe for Lelon Huh, MD.,have documented all relevant documentation on the behalf of Lelon Huh, MD,as directed by  Lelon Huh, MD while in the presence of Lelon Huh, MD.     Established patient visit   Patient: Theresa [name]   DOB: 10/30/1955   67 y.o. Female  MRN: [id number] Visit Date: 01/16/2023  Today's healthcare provider: Lelon Huh, MD   No chief complaint on file.  Subjective    HPI  Hypertension, follow-up  BP Readings from Last 3 Encounters:  12/27/22 (!) 146/79  12/19/22 120/61  12/14/22 97/60   Wt Readings from Last 3 Encounters:  12/27/22 207 lb (93.9 kg)  12/19/22 212 lb (96.2 kg)  12/14/22 215 lb 6.4 oz (97.7 kg)     She was last seen for hypertension 1.5 years ago.  She has had acute visits and hospital follow up visit since that time.  BP at that visit was 117/65. Management since that visit includes ***.  She reports {excellent/good/fair/poor:19665} compliance with treatment. She {is/is not:9024} having side effects. {document side effects if present:1} She is following a {diet:21022986} diet. She {is/is not:9024} exercising. She {does/does not:200015} smoke.  Use of agents associated with hypertension: {bp agents assoc with hypertension:511::"none"}.   Outside blood pressures are {***enter patient reported home BP readings, or 'not being checked':1}. Symptoms: {Yes/No:20286} chest pain {Yes/No:20286} chest pressure  {Yes/No:20286} palpitations {Yes/No:20286} syncope  {Yes/No:20286} dyspnea {Yes/No:20286} orthopnea  {Yes/No:20286} paroxysmal nocturnal dyspnea {Yes/No:20286} lower extremity edema   Pertinent labs Lab Results  Component Value Date   CHOL 175 04/08/2022   HDL 58 04/08/2022   LDLCALC 86 04/08/2022   TRIG 154 (H) 04/08/2022   CHOLHDL 3.0 04/08/2022   Lab Results  Component Value Date   NA 139 11/08/2022   K 3.9 11/08/2022   CREATININE 0.71 11/08/2022   GFRNONAA >60 11/08/2022    GLUCOSE 126 (H) 11/08/2022   TSH 0.765 04/23/2022     The ASCVD Risk score (Arnett DK, et al., 2019) failed to calculate for the following reasons:   The patient has a prior MI or stroke diagnosis  ---------------------------------------------------------------------------------------------------   Medications: Outpatient Medications Prior to Visit  Medication Sig   acetaminophen (TYLENOL) 500 MG tablet Take 1,000 mg by mouth every 8 (eight) hours as needed for mild pain or headache.   albuterol (VENTOLIN HFA) 108 (90 Base) MCG/ACT inhaler Inhale 2 puffs into the lungs every 6 (six) hours as needed for wheezing or shortness of breath.   amiodarone (PACERONE) 200 MG tablet Take 1 tablet (200 mg total) by mouth daily.   buPROPion (WELLBUTRIN SR) 150 MG 12 hr tablet Take 150 mg by mouth daily.   busPIRone (BUSPAR) 5 MG tablet TAKE 1 TABLET BY MOUTH TWICE DAILY   butalbital-acetaminophen-caffeine (FIORICET) 50-325-40 MG tablet Take 1 tablet by mouth every 6 (six) hours as needed for headache. LIMIT USE TO PREVENT MEDICATION OVERUSE HEADACHE   Cholecalciferol 50 MCG (2000 UT) CAPS Take 2,000 Units by mouth daily.    dapagliflozin propanediol (FARXIGA) 10 MG TABS tablet Take 1 tablet (10 mg total) by mouth daily before breakfast.   diclofenac Sodium (VOLTAREN) 1 % GEL Apply 2 g topically 4 (four) times daily as needed (lower back and/or shoulder pain).   diltiazem (CARDIZEM CD) 180 MG 24 hr capsule Take 180 mg by mouth daily.   ferrous sulfate 325 (65 FE) MG EC tablet Take 1 tablet (325 mg total) by mouth 2 (two) times daily with a meal. (  Patient taking differently: Take 325 mg by mouth daily with breakfast.)   Fluticasone-Umeclidin-Vilant 100-62.5-25 MCG/INH AEPB Inhale 1 puff into the lungs daily.   FUROSCIX 80 MG/10ML CTKT Inject into the skin.   HYDROcodone-acetaminophen (NORCO/VICODIN) 5-325 MG tablet Take 1 tablet by mouth every 8 (eight) hours as needed for severe pain. Must last 30  days   HYDROcodone-acetaminophen (NORCO/VICODIN) 5-325 MG tablet Take 1 tablet by mouth every 8 (eight) hours as needed for severe pain. Must last 30 days   ipratropium-albuterol (DUONEB) 0.5-2.5 (3) MG/3ML SOLN USE 1 VIAL VIA NEBULIZER EVERY 6 HOURS AS NEEDED FOR SHORTNESS OF BREATH OR WHEEZING   melatonin 5 MG TABS Take 5 mg by mouth at bedtime.   naloxone (NARCAN) nasal spray 4 mg/0.1 mL Place 1 spray into the nose as needed for up to 365 doses (for opioid-induced respiratory depresssion). In case of emergency (overdose), spray once into each nostril. If no response within 3 minutes, repeat application and call [phone].   omeprazole (PRILOSEC) 40 MG capsule Take 40 mg by mouth daily.   polyethylene glycol (MIRALAX / GLYCOLAX) 17 g packet Take 17 g by mouth daily as needed for mild constipation or moderate constipation.   potassium chloride SA (KLOR-CON M) 20 MEQ tablet Take 2 tablets every morning and 1 tablet every evening   pregabalin (LYRICA) 75 MG capsule Take 1 capsule (75 mg total) by mouth 3 (three) times daily.   roflumilast (DALIRESP) 500 MCG TABS tablet Take 500 mcg by mouth daily.   sertraline (ZOLOFT) 100 MG tablet TAKE 2 TABLETS BY MOUTH ONCE EVERY MORNING   simvastatin (ZOCOR) 20 MG tablet Take 20 mg by mouth at bedtime.   spironolactone (ALDACTONE) 25 MG tablet Take 25 mg by mouth daily.   torsemide (DEMADEX) 20 MG tablet Take 40-80 mg by mouth See admin instructions. Take 4 tablets (80mg ) by mouth every morning and take 2 tablets (40mg ) by mouth every afternoon   varenicline (CHANTIX) 1 MG tablet Take 1 mg by mouth 2 (two) times daily.   No facility-administered medications prior to visit.    Review of Systems  {Labs  Heme  Chem  Endocrine  Serology  Results Review (optional):23779}   Objective    There were no vitals taken for this visit. {Show previous vital signs (optional):23777}  Physical Exam  ***  No results found for any visits on 01/16/23.  Assessment &  Plan     ***  No follow-ups on file.      {provider attestation***:1}   Lelon Huh, MD  [place] [phone] (phone) [phone] (fax)  [place]

## 2023-01-21 DIAGNOSIS — J9610 Chronic respiratory failure, unspecified whether with hypoxia or hypercapnia: Secondary | ICD-10-CM | POA: Diagnosis not present

## 2023-01-23 DIAGNOSIS — J449 Chronic obstructive pulmonary disease, unspecified: Secondary | ICD-10-CM | POA: Diagnosis not present

## 2023-01-23 DIAGNOSIS — I272 Pulmonary hypertension, unspecified: Secondary | ICD-10-CM | POA: Diagnosis not present

## 2023-01-23 DIAGNOSIS — R0602 Shortness of breath: Secondary | ICD-10-CM | POA: Diagnosis not present

## 2023-01-23 DIAGNOSIS — Z9981 Dependence on supplemental oxygen: Secondary | ICD-10-CM | POA: Diagnosis not present

## 2023-01-24 ENCOUNTER — Ambulatory Visit
Admission: RE | Admit: 2023-01-24 | Discharge: 2023-01-24 | Disposition: A | Discharge status: Home / Self Care | Payer: Medicare HMO | Source: Ambulatory Visit | Attending: Pain Medicine | Admitting: Pain Medicine

## 2023-01-24 ENCOUNTER — Ambulatory Visit: Payer: Medicare HMO | Attending: Pain Medicine | Admitting: Pain Medicine

## 2023-01-24 ENCOUNTER — Encounter: Payer: Self-pay | Admitting: Pain Medicine

## 2023-01-24 VITALS — BP 128/81 | HR 79 | Temp 97.2°F | Resp 19 | Ht 62.0 in | Wt 207.0 lb

## 2023-01-24 DIAGNOSIS — M5416 Radiculopathy, lumbar region: Secondary | ICD-10-CM | POA: Diagnosis not present

## 2023-01-24 DIAGNOSIS — M5136 Other intervertebral disc degeneration, lumbar region: Secondary | ICD-10-CM

## 2023-01-24 DIAGNOSIS — G8929 Other chronic pain: Secondary | ICD-10-CM | POA: Diagnosis present

## 2023-01-24 DIAGNOSIS — M25562 Pain in left knee: Secondary | ICD-10-CM | POA: Insufficient documentation

## 2023-01-24 DIAGNOSIS — Z5189 Encounter for other specified aftercare: Secondary | ICD-10-CM

## 2023-01-24 DIAGNOSIS — R937 Abnormal findings on diagnostic imaging of other parts of musculoskeletal system: Secondary | ICD-10-CM

## 2023-01-24 DIAGNOSIS — M25552 Pain in left hip: Secondary | ICD-10-CM | POA: Insufficient documentation

## 2023-01-24 DIAGNOSIS — Z79899 Other long term (current) drug therapy: Secondary | ICD-10-CM | POA: Insufficient documentation

## 2023-01-24 DIAGNOSIS — Z79891 Long term (current) use of opiate analgesic: Secondary | ICD-10-CM | POA: Insufficient documentation

## 2023-01-24 DIAGNOSIS — M545 Low back pain, unspecified: Secondary | ICD-10-CM | POA: Diagnosis present

## 2023-01-24 DIAGNOSIS — M47816 Spondylosis without myelopathy or radiculopathy, lumbar region: Secondary | ICD-10-CM | POA: Insufficient documentation

## 2023-01-24 DIAGNOSIS — M5126 Other intervertebral disc displacement, lumbar region: Secondary | ICD-10-CM | POA: Insufficient documentation

## 2023-01-24 DIAGNOSIS — M79604 Pain in right leg: Secondary | ICD-10-CM | POA: Insufficient documentation

## 2023-01-24 DIAGNOSIS — M79605 Pain in left leg: Secondary | ICD-10-CM | POA: Insufficient documentation

## 2023-01-24 DIAGNOSIS — G894 Chronic pain syndrome: Secondary | ICD-10-CM | POA: Insufficient documentation

## 2023-01-24 DIAGNOSIS — M5417 Radiculopathy, lumbosacral region: Secondary | ICD-10-CM

## 2023-01-24 DIAGNOSIS — M48061 Spinal stenosis, lumbar region without neurogenic claudication: Secondary | ICD-10-CM

## 2023-01-24 DIAGNOSIS — M25551 Pain in right hip: Secondary | ICD-10-CM | POA: Diagnosis present

## 2023-01-24 DIAGNOSIS — Z09 Encounter for follow-up examination after completed treatment for conditions other than malignant neoplasm: Secondary | ICD-10-CM | POA: Diagnosis present

## 2023-01-24 MED ORDER — FENTANYL CITRATE (PF) 100 MCG/2ML IJ SOLN
INTRAMUSCULAR | Status: AC
  Filled 2023-01-24: qty 2

## 2023-01-24 MED ORDER — DEXAMETHASONE SODIUM PHOSPHATE 10 MG/ML IJ SOLN
INTRAMUSCULAR | Status: AC
  Filled 2023-01-24: qty 2

## 2023-01-24 MED ORDER — DEXAMETHASONE SODIUM PHOSPHATE 10 MG/ML IJ SOLN
20.0000 mg | Freq: Once | INTRAMUSCULAR | Status: AC
  Administered 2023-01-24: 20 mg

## 2023-01-24 MED ORDER — LIDOCAINE HCL 2 % IJ SOLN
INTRAMUSCULAR | Status: AC
  Filled 2023-01-24: qty 20

## 2023-01-24 MED ORDER — FENTANYL CITRATE (PF) 100 MCG/2ML IJ SOLN
25.0000 ug | INTRAMUSCULAR | Status: DC | PRN
  Administered 2023-01-24: 50 ug via INTRAVENOUS

## 2023-01-24 MED ORDER — ROPIVACAINE HCL 2 MG/ML IJ SOLN
INTRAMUSCULAR | Status: AC
  Filled 2023-01-24: qty 20

## 2023-01-24 MED ORDER — IOHEXOL 180 MG/ML  SOLN
INTRAMUSCULAR | Status: AC
  Filled 2023-01-24: qty 10

## 2023-01-24 MED ORDER — HYDROCODONE-ACETAMINOPHEN 5-325 MG PO TABS: 1.0000 | ORAL_TABLET | Freq: Three times a day (TID) | ORAL | 0 refills | Status: DC | PRN

## 2023-01-24 MED ORDER — PENTAFLUOROPROP-TETRAFLUOROETH EX AERO
INHALATION_SPRAY | Freq: Once | CUTANEOUS | Status: AC
  Administered 2023-01-24: 30 via TOPICAL
  Filled 2023-01-24: qty 116

## 2023-01-24 MED ORDER — MIDAZOLAM HCL 5 MG/5ML IJ SOLN
0.5000 mg | Freq: Once | INTRAMUSCULAR | Status: AC
  Administered 2023-01-24: 2 mg via INTRAVENOUS

## 2023-01-24 MED ORDER — LACTATED RINGERS IV SOLN: Freq: Once | INTRAVENOUS | Status: AC

## 2023-01-24 MED ORDER — SODIUM CHLORIDE (PF) 0.9 % IJ SOLN
INTRAMUSCULAR | Status: AC
  Filled 2023-01-24: qty 10

## 2023-01-24 MED ORDER — SODIUM CHLORIDE 0.9% FLUSH
2.0000 mL | Freq: Once | INTRAVENOUS | Status: AC
  Administered 2023-01-24: 2 mL

## 2023-01-24 MED ORDER — IOHEXOL 180 MG/ML  SOLN
10.0000 mL | Freq: Once | INTRAMUSCULAR | Status: AC
  Administered 2023-01-24: 10 mL via INTRATHECAL

## 2023-01-24 MED ORDER — LIDOCAINE HCL 2 % IJ SOLN
20.0000 mL | Freq: Once | INTRAMUSCULAR | Status: AC
  Administered 2023-01-24: 400 mg

## 2023-01-24 MED ORDER — ROPIVACAINE HCL 2 MG/ML IJ SOLN
2.0000 mL | Freq: Once | INTRAMUSCULAR | Status: AC
  Administered 2023-01-24: 2 mL via EPIDURAL

## 2023-01-24 MED ORDER — MIDAZOLAM HCL 5 MG/5ML IJ SOLN
INTRAMUSCULAR | Status: AC
  Filled 2023-01-24: qty 5

## 2023-01-24 NOTE — Progress Notes (Signed)
PROVIDER NOTE: Interpretation of information contained herein should be left to medically-trained personnel. Specific patient instructions are provided elsewhere under "Patient Instructions" section of medical record. This document was created in part using STT-dictation technology, any transcriptional errors that may result from this process are unintentional.  Patient: Theresa [name] Type: Established DOB: [birth year] MRN: [id number] PCP: Malva Limes, MD  Service: Procedure DOS: 01/24/2023 Setting: Ambulatory Location: Ambulatory outpatient facility Delivery: Face-to-face Provider: Oswaldo Done, MD Specialty: Interventional Pain Management Specialty designation: 09 Location: Outpatient facility Ref. Prov.: Delano Metz, MD       Interventional Therapy   Procedure: Lumbar trans-foraminal epidural steroid injection (L-TFESI) #2  Laterality: Bilateral (-50)  Level: L4 nerve root(s) Imaging: Fluoroscopy-guided         Anesthesia: Local anesthesia (1-2% Lidocaine) Anxiolysis: IV Versed 2.0 mg Sedation: Moderate Sedation Fentanyl 1 mL (50 mcg) DOS: 01/24/2023  Performed by: Oswaldo Done, MD  Purpose: Diagnostic/Therapeutic Indications: Lumbar radicular pain severe enough to impact quality of life or function. 1. Chronic lower extremity pain (2ry area of Pain) (Left)   2. Chronic lumbar radicular pain (L4 & S1 Dermatome) (Left)   3. DDD (degenerative disc disease), lumbar   4. 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 Lumbosacral radiculopathy at L4   7. Lumbar spondylosis (L4-5)   8. Spinal stenosis of lumbar region, unspecified whether neurogenic claudication present    NAS-11 Pain score:   Pre-procedure: 8 /10   Post-procedure: 5 /10      RTCB: 04/29/2023 (Scripts sent today.)  Position / Prep / Materials:  Position: Prone  Prep solution: DuraPrep (Iodine Povacrylex [0.7% available  iodine] and Isopropyl Alcohol, 74% w/w) Prep Area: Entire Posterior Lumbosacral Area.  From the lower tip of the scapula down to the tailbone and from flank to flank. Materials:  Tray: Block Needle(s):  Type: Spinal  Gauge (G): 22  Length: 5-in  Qty: 2      Post-procedure evaluation   Procedure: Lumbar Facet, Medial Branch Radiofrequency Ablation (RFA) #2  Laterality: Left (-LT)  Level: L2, L3, L4, L5, and S1 Medial Branch Level(s). These levels will denervate the L3-4, L4-5, and L5-S1 lumbar facet joints.  Imaging: Fluoroscopy-guided         Anesthesia: Local anesthesia (1-2% Lidocaine) Anxiolysis: IV Versed 2.0 mg Sedation: Moderate Sedation Fentanyl 1 mL (50 mcg) DOS: 12/27/2022  Performed by: Oswaldo Done, MD  Purpose: Therapeutic/Palliative Indications: Low back pain severe enough to impact quality of life or function. Indications: 1. Chronic low back pain (1ry area of Pain) (Bilateral) (R>L) w/o sciatica   2. Lumbar facet syndrome (Bilateral) (R>L)   3. Lumbar facet hypertrophy (multilevel)   4. Spondylosis without myelopathy or radiculopathy, lumbosacral region   5. DDD (degenerative disc disease), lumbar   6. Chronic anticoagulation Carlena Hurl)    Ms. Kerzner has been dealing with the above chronic pain for longer than three months and has either failed to respond, was unable to tolerate, or simply did not get enough benefit from other more conservative therapies including, but not limited to: 1. Over-the-counter medications 2. Anti-inflammatory medications 3. Muscle relaxants 4. Membrane stabilizers 5. Opioids 6. Physical therapy and/or chiropractic manipulation 7. Modalities (Heat, ice, etc.) 8. Invasive techniques such as nerve blocks. Ms. Meland has attained more than 50% relief of the pain from a series of diagnostic injections conducted in separate occasions.  Pain Score: Pre-procedure: 7 /10 Post-procedure: 0-No pain/10  Effectiveness:  Initial  hour after procedure: 100 %. Subsequent 4-6 hours post-procedure: 100 %. Analgesia past initial 6 hours: 0 % (100% relief for 2-3 days only). Ongoing improvement:  Analgesic:  It has been only a few weeks since procedure. Recovery is at least 6 weeks. She is having a burning dysesthesia over the lower back, and the back of both legs, down to the back of the knees. Function: Minimal improvement, so far.  ROM: Minimal improvement, so far.  Pre-op H&P Assessment:  Ms. Poorman is a 67 y.o. (year old), female patient, seen today for interventional treatment. She  has a past surgical history that includes Abdominal hysterectomy; Tonsillectomy; Carpal tunnel release (Left, 2012); Tubal ligation; epigastric hernia repair (N/A, 08/13/2018); Umbilical hernia repair (N/A, 08/13/2018); RIGHT/LEFT HEART CATH AND CORONARY ANGIOGRAPHY (N/A, 07/11/2019); TEE without cardioversion (N/A, 04/29/2022); Cardioversion (N/A, 04/29/2022); Esophagogastroduodenoscopy (N/A, 06/21/2022); and cataract surgery (Bilateral). Ms. Hogan has a current medication list which includes the following prescription(s): acetaminophen, albuterol, amiodarone, buspirone, butalbital-acetaminophen-caffeine, cholecalciferol, dapagliflozin propanediol, diclofenac sodium, diltiazem, ferrous sulfate, fluticasone-umeclidin-vilant, furoscix, [START ON 01/29/2023] hydrocodone-acetaminophen, [START ON 02/28/2023] hydrocodone-acetaminophen, [START ON 03/30/2023] hydrocodone-acetaminophen, ipratropium-albuterol, melatonin, naloxone, omeprazole, polyethylene glycol, potassium chloride sa, pregabalin, roflumilast, sertraline, simvastatin, spironolactone, torsemide, and varenicline, and the following Facility-Administered Medications: fentanyl. Her primarily concern today is the Back Pain  Initial Vital Signs:  Pulse/HCG Rate: 79ECG Heart Rate: 85 (NSR) Temp: (!) 97.2 F (36.2 C) Resp: 18 BP: 131/75 SpO2: 98 % (3L of O2)  BMI: Estimated body mass index is  37.86 kg/m as calculated from the following:   Height as of this encounter: 5\' 2"  (1.575 m).   Weight as of this encounter: 207 lb (93.9 kg).  Risk Assessment: Allergies: Reviewed. She is allergic to gabapentin, codeine, and meloxicam.  Allergy Precautions: None required Coagulopathies: Reviewed. None identified.  Blood-thinner therapy: None at this time Active Infection(s): Reviewed. None identified. Ms. Gritz is afebrile  Site Confirmation: Ms. Grindle was asked to confirm the procedure and laterality before marking the site Procedure checklist: Completed Consent: Before the procedure and under the influence of no sedative(s), amnesic(s), or anxiolytics, the patient was informed of the treatment options, risks and possible complications. To fulfill our ethical and legal obligations, as recommended by the American Medical Association's Code of Ethics, I have informed the patient of my clinical impression; the nature and purpose of the treatment or procedure; the risks, benefits, and possible complications of the intervention; the alternatives, including doing nothing; the risk(s) and benefit(s) of the alternative treatment(s) or procedure(s); and the risk(s) and benefit(s) of doing nothing. The patient was provided information about the general risks and possible complications associated with the procedure. These may include, but are not limited to: failure to achieve desired goals, infection, bleeding, organ or nerve damage, allergic reactions, paralysis, and death. In addition, the patient was informed of those risks and complications associated to Spine-related procedures, such as failure to decrease pain; infection (i.e.: Meningitis, epidural or intraspinal abscess); bleeding (i.e.: epidural hematoma, subarachnoid hemorrhage, or any other type of intraspinal or peri-dural bleeding); organ or nerve damage (i.e.: Any type of peripheral nerve, nerve root, or spinal cord injury) with subsequent  damage to sensory, motor, and/or autonomic systems, resulting in permanent pain, numbness, and/or weakness of one or several areas of the body; allergic reactions; (i.e.: anaphylactic reaction); and/or death. Furthermore, the patient was informed of those risks and complications associated with the medications. These include, but are not limited to: allergic reactions (i.e.: anaphylactic or anaphylactoid reaction(s)); adrenal  axis suppression; blood sugar elevation that in diabetics may result in ketoacidosis or comma; water retention that in patients with history of congestive heart failure may result in shortness of breath, pulmonary edema, and decompensation with resultant heart failure; weight gain; swelling or edema; medication-induced neural toxicity; particulate matter embolism and blood vessel occlusion with resultant organ, and/or nervous system infarction; and/or aseptic necrosis of one or more joints. Finally, the patient was informed that Medicine is not an exact science; therefore, there is also the possibility of unforeseen or unpredictable risks and/or possible complications that may result in a catastrophic outcome. The patient indicated having understood very clearly. We have given the patient no guarantees and we have made no promises. Enough time was given to the patient to ask questions, all of which were answered to the patient's satisfaction. Ms. Morency has indicated that she wanted to continue with the procedure. Attestation: I, the ordering provider, attest that I have discussed with the patient the benefits, risks, side-effects, alternatives, likelihood of achieving goals, and potential problems during recovery for the procedure that I have provided informed consent. Date  Time: 01/24/2023  8:37 AM   Pre-Procedure Preparation:  Monitoring: As per clinic protocol. Respiration, ETCO2, SpO2, BP, heart rate and rhythm monitor placed and checked for adequate function Safety Precautions:  Patient was assessed for positional comfort and pressure points before starting the procedure. Time-out: I initiated and conducted the "Time-out" before starting the procedure, as per protocol. The patient was asked to participate by confirming the accuracy of the "Time Out" information. Verification of the correct person, site, and procedure were performed and confirmed by me, the nursing staff, and the patient. "Time-out" conducted as per Joint Commission's Universal Protocol (UP.01.01.01). Time: 0905 Start Time: 0905 hrs.  Description/Narrative of Procedure:          Target: The 6 o'clock position under the pedicle, on the affected side. Region: Posterolateral Lumbosacral Approach: Posterior Percutaneous Paravertebral approach.  Rationale (medical necessity): procedure needed and proper for the diagnosis and/or treatment of the patient's medical symptoms and needs. Procedural Technique Safety Precautions: Aspiration looking for blood return was conducted prior to all injections. At no point did we inject any substances, as a needle was being advanced. No attempts were made at seeking any paresthesias. Safe injection practices and needle disposal techniques used. Medications properly checked for expiration dates. SDV (single dose vial) medications used. Description of the Procedure: Protocol guidelines were followed. The patient was placed in position over the procedure table. The target area was identified and the area prepped in the usual manner. Skin & deeper tissues infiltrated with local anesthetic. Appropriate amount of time allowed to pass for local anesthetics to take effect. The procedure needles were then advanced to the target area. Proper needle placement secured. Negative aspiration confirmed. Solution injected in intermittent fashion, asking for systemic symptoms every 0.5cc of injectate. The needles were then removed and the area cleansed, making sure to leave some of the prepping  solution back to take advantage of its long term bactericidal properties.  Vitals:   01/24/23 0915 01/24/23 0925 01/24/23 0935 01/24/23 0945  BP: (!) 137/100 112/69 107/80 128/81  Pulse:      Resp: 18 20 20 19   Temp:      TempSrc:      SpO2: 100% 91% 98% 97%  Weight:      Height:        Start Time: 0905 hrs. End Time: 0914 hrs.  Imaging Guidance (Spinal):  Type of Imaging Technique: Fluoroscopy Guidance (Spinal) Indication(s): Assistance in needle guidance and placement for procedures requiring needle placement in or near specific anatomical locations not easily accessible without such assistance. Exposure Time: Please see nurses notes. Contrast: Before injecting any contrast, we confirmed that the patient did not have an allergy to iodine, shellfish, or radiological contrast. Once satisfactory needle placement was completed at the desired level, radiological contrast was injected. Contrast injected under live fluoroscopy. No contrast complications. See chart for type and volume of contrast used. Fluoroscopic Guidance: I was personally present during the use of fluoroscopy. "Tunnel Vision Technique" used to obtain the best possible view of the target area. Parallax error corrected before commencing the procedure. "Direction-depth-direction" technique used to introduce the needle under continuous pulsed fluoroscopy. Once target was reached, antero-posterior, oblique, and lateral fluoroscopic projection used confirm needle placement in all planes. Images permanently stored in EMR. Interpretation: I personally interpreted the imaging intraoperatively. Adequate needle placement confirmed in multiple planes. Appropriate spread of contrast into desired area was observed. No evidence of afferent or efferent intravascular uptake. No intrathecal or subarachnoid spread observed. Permanent images saved into the patient's record.  Post-operative Assessment:  Post-procedure Vital Signs:   Pulse/HCG Rate: 7978 Temp: (!) 97.2 F (36.2 C) Resp: 19 BP: 128/81 SpO2: 97 %  EBL: None  Complications: No immediate post-treatment complications observed by team, or reported by patient.  Note: The patient tolerated the entire procedure well. A repeat set of vitals were taken after the procedure and the patient was kept under observation following institutional policy, for this type of procedure. Post-procedural neurological assessment was performed, showing return to baseline, prior to discharge. The patient was provided with post-procedure discharge instructions, including a section on how to identify potential problems. Should any problems arise concerning this procedure, the patient was given instructions to immediately contact us, at any time, without hesitation. In any case, we plan to contact the patient by telephone for a follow-up status report regarding this interventional procedure.  Comments:  No additional relevant information.  Plan of Care (POC)  Orders:  Orders Placed This Encounter  Procedures   Lumbar Transforaminal Epidural    Scheduling Instructions:     Laterality: Left-sided     Level(s): L4 & S1     Sedation: With Sedation.     Timeframe: Today    Order Specific Question:   Where will this procedure be performed?    Answer:   [facility]   DG [facility] C-ARM 1-60 MIN NO REPORT    Intraoperative interpretation by procedural physician at [facility].    Standing Status:   Standing    Number of Occurrences:   1    Order Specific Question:   Reason for exam:    Answer:   Assistance in needle guidance and placement for procedures requiring needle placement in or near specific anatomical locations not easily accessible without such assistance.   Informed Consent Details: Physician/Practitioner Attestation; Transcribe to consent form and obtain patient signature    Provider Attestation: I, Avalie Oconnor A. Laban Emperor, MD, (Pain Management  Specialist), the physician/practitioner, attest that I have discussed with the patient the benefits, risks, side effects, alternatives, likelihood of achieving goals and potential problems during recovery for the procedure that I have provided informed consent.    Scheduling Instructions:     Note: Always confirm laterality of pain with Ms. Chandra Batch, before procedure.     Transcribe to consent form and obtain patient signature.  Order Specific Question:   Physician/Practitioner attestation of informed consent for procedure/surgical case    Answer:   I, the physician/practitioner, attest that I have discussed with the patient the benefits, risks, side effects, alternatives, likelihood of achieving goals and potential problems during recovery for the procedure that I have provided informed consent.    Order Specific Question:   Procedure    Answer:   Diagnostic lumbar transforaminal epidural steroid injection under fluoroscopic guidance. (See notes for level and laterality.)    Order Specific Question:   Physician/Practitioner performing the procedure    Answer:   Adonijah Baena A. Laban Emperor, MD    Order Specific Question:   Indication/Reason    Answer:   Lumbar radiculopathy/radiculitis associated with lumbar stenosis   Provide equipment / supplies at bedside    Procedure tray: "Block Tray" (Disposable  single use) Skin infiltration needle: Regular 1.5-in, 25-G, (x1) Block Needle type: Spinal Amount/quantity: 2 Size: Medium (5-inch) Gauge: 22G    Standing Status:   Standing    Number of Occurrences:   1    Order Specific Question:   Specify    Answer:   Block Tray   Nursing Instructions:    Please complete this patient's postprocedure evaluation.    Scheduling Instructions:     Please complete this patient's postprocedure evaluation.   Chronic Opioid Analgesic:  Hydrocodone/APAP 5/325 one tablet every 8 hours (15 mg/day of hydrocodone) MME/day: 15 mg/day.   Medications ordered for  procedure: Meds ordered this encounter  Medications   iohexol (OMNIPAQUE) 180 MG/ML injection 10 mL    Must be Myelogram-compatible. If not available, you may substitute with a water-soluble, non-ionic, hypoallergenic, myelogram-compatible radiological contrast medium.   lidocaine (XYLOCAINE) 2 % (with pres) injection 400 mg   pentafluoroprop-tetrafluoroeth (GEBAUERS) aerosol   lactated ringers infusion   midazolam (VERSED) 5 MG/5ML injection 0.5-2 mg    Make sure Flumazenil is available in the pyxis when using this medication. If oversedation occurs, administer 0.2 mg IV over 15 sec. If after 45 sec no response, administer 0.2 mg again over 1 min; may repeat at 1 min intervals; not to exceed 4 doses (1 mg)   fentaNYL (SUBLIMAZE) injection 25-50 mcg    Make sure Narcan is available in the pyxis when using this medication. In the event of respiratory depression (RR< 8/min): Titrate NARCAN (naloxone) in increments of 0.1 to 0.2 mg IV at 2-3 minute intervals, until desired degree of reversal.   sodium chloride flush (NS) 0.9 % injection 2 mL    This is for a two (2) level block. Use two (2) syringes and divide content in half.   ropivacaine (PF) 2 mg/mL (0.2%) (NAROPIN) injection 2 mL    This is for a two (2) level block. Use two (2) syringes and divide content in half.   dexamethasone (DECADRON) injection 20 mg    This is for a two (2) level block. Use two (2) syringes and divide content in half.   HYDROcodone-acetaminophen (NORCO/VICODIN) 5-325 MG tablet    Sig: Take 1 tablet by mouth every 8 (eight) hours as needed for severe pain. Must last 30 days    Dispense:  90 tablet    Refill:  0    DO NOT: delete (not duplicate); no partial-fill (will deny script to complete), no refill request (F/U required). DISPENSE: 1 day early if closed on fill date. WARN: No CNS-depressants within 8 hrs of med.   HYDROcodone-acetaminophen (NORCO/VICODIN) 5-325 MG tablet    Sig: Take  1 tablet by mouth every 8  (eight) hours as needed for severe pain. Must last 30 days    Dispense:  90 tablet    Refill:  0    DO NOT: delete (not duplicate); no partial-fill (will deny script to complete), no refill request (F/U required). DISPENSE: 1 day early if closed on fill date. WARN: No CNS-depressants within 8 hrs of med.   HYDROcodone-acetaminophen (NORCO/VICODIN) 5-325 MG tablet    Sig: Take 1 tablet by mouth every 8 (eight) hours as needed for severe pain. Must last 30 days    Dispense:  90 tablet    Refill:  0    DO NOT: delete (not duplicate); no partial-fill (will deny script to complete), no refill request (F/U required). DISPENSE: 1 day early if closed on fill date. WARN: No CNS-depressants within 8 hrs of med.   Medications administered: We administered iohexol, lidocaine, pentafluoroprop-tetrafluoroeth, lactated ringers, midazolam, fentaNYL, sodium chloride flush, ropivacaine (PF) 2 mg/mL (0.2%), and dexamethasone.  See the medical record for exact dosing, route, and time of administration.  Follow-up plan:   Return in about 2 weeks (around 02/07/2023) for Proc-day (T,Th), (Face2F), (PPE).       Interventional Therapies  Risk  Complexity Considerations:   NOTE: NO further Lumbar RFA until BMI <35.   Planned  Pending:   Therapeutic right (L4-5, L5-S1) lumbar facet RFA (L3, L4, L5, and S1 medial branch) #2 (last done on 10/03/2017) (pending clearance to stop Xarelto x 3 days)   Under consideration:   Possible left lumbar facet RFA #2 (last done on 2018)  Diagnostic bilateral cervical facet MBB #1  Palliative left L2-3 LESI #4    Completed:   Palliative right lumbar facet MBB x7 (09/20/2022) (100/100/100x5days/0)  Palliative left lumbar facet MBB x6 (11/11/2021) (100/100/0/0)  Therapeutic left L2-3 LESI x3 (09/23/2021) (80/80/80)  Diagnostic/palliative left L4 TFESI x1 (12/26/2017) (100/100/50/>50)  Diagnostic/palliative left L4-5 LESI x2 (07/30/2019) (100/100/0/0)  Diagnostic/palliative  right L4 TFESI x1 (01/25/2018) (100/100/50/50)  Diagnostic/palliative right L4-5 LESI x1 (01/25/2018) (100/100/50/50)  Palliative left lumbar facet RFA x1 (06/27/2017) (0/0/100/>50)  Palliative right lumbar facet RFA x1 (10/03/2017) (100/100/75)    Therapeutic  Palliative (PRN) options:   Therapeutic lumbar facet MBB   Therapeutic left L2 LESI  Therapeutic lumbar facet RFA     Pharmacotherapy  Nonopioids transferred 08/03/2020: Gabapentin and baclofen         Recent Visits Date Type Provider Dept  12/27/22 Procedure visit Delano MetzNaveira, Kambrea Carrasco, MD [facility]  12/19/22 Office Visit Delano MetzNaveira, Kaelee Pfeffer, MD [facility]  12/01/22 Procedure visit Delano MetzNaveira, Deijah Spikes, MD [facility]  10/26/22 Office Visit Delano MetzNaveira, Lyvia Mondesir, MD [facility]  Showing recent visits within past 90 days and meeting all other requirements Today's Visits Date Type Provider Dept  01/24/23 Procedure visit Delano MetzNaveira, Chauncey Bruno, MD [facility]  Showing today's visits and meeting all other requirements Future Appointments Date Type Provider Dept  02/07/23 Appointment Delano MetzNaveira, Anice Wilshire, MD [facility]  04/19/23 Appointment Delano MetzNaveira, Claudina Oliphant, MD [facility]  Showing future appointments within next 90 days and meeting all other requirements  Disposition: Discharge home  Discharge (Date  Time): 01/24/2023; 0950 hrs.   Primary Care Physician: Malva LimesFisher, Donald E, MD [facility] Note by: Oswaldo DoneFrancisco A Tiarra Anastacio, MD (TTS technology used. I apologize for any typographical errors that were not detected and corrected.) Date: 01/24/2023; Time: 10:12 AM  Disclaimer:  Medicine is not an Visual merchandiserexact science. The only guarantee in  medicine is that nothing is guaranteed. It is important to note that the decision to proceed with this intervention was based on the information collected from the patient. The Data and conclusions were  drawn from the patient's questionnaire, the interview, and the physical examination. Because the information was provided in large part by the patient, it cannot be guaranteed that it has not been purposely or unconsciously manipulated. Every effort has been made to obtain as much relevant data as possible for this evaluation. It is important to note that the conclusions that lead to this procedure are derived in large part from the available data. Always take into account that the treatment will also be dependent on availability of resources and existing treatment guidelines, considered by other Pain Management Practitioners as being common knowledge and practice, at the time of the intervention. For Medico-Legal purposes, it is also important to point out that variation in procedural techniques and pharmacological choices are the acceptable norm. The indications, contraindications, technique, and results of the above procedure should only be interpreted and judged by a Board-Certified Interventional Pain Specialist with extensive familiarity and expertise in the same exact procedure and technique.

## 2023-01-24 NOTE — Progress Notes (Signed)
Safety precautions to be maintained throughout the outpatient stay will include: orient to surroundings, keep bed in low position, maintain call bell within reach at all times, provide assistance with transfer out of bed and ambulation.  

## 2023-01-24 NOTE — Patient Instructions (Signed)
____________________________________________________________________________________________  Post-Procedure Discharge Instructions  Instructions: Apply ice:  Purpose: This will minimize any swelling and discomfort after procedure.  When: Day of procedure, as soon as you get home. How: Fill a plastic sandwich bag with crushed ice. Cover it with a small towel and apply to injection site. How long: (15 min on, 15 min off) Apply for 15 minutes then remove x 15 minutes.  Repeat sequence on day of procedure, until you go to bed. Apply heat:  Purpose: To treat any soreness and discomfort from the procedure. When: Starting the next day after the procedure. How: Apply heat to procedure site starting the day following the procedure. How long: May continue to repeat daily, until discomfort goes away. Food intake: Start with clear liquids (like water) and advance to regular food, as tolerated.  Physical activities: Keep activities to a minimum for the first 8 hours after the procedure. After that, then as tolerated. Driving: If you have received any sedation, be responsible and do not drive. You are not allowed to drive for 24 hours after having sedation. Blood thinner: (Applies only to those taking blood thinners) You may restart your blood thinner 6 hours after your procedure. Insulin: (Applies only to Diabetic patients taking insulin) As soon as you can eat, you may resume your normal dosing schedule. Infection prevention: Keep procedure site clean and dry. Shower daily and clean area with soap and water. Post-procedure Pain Diary: Extremely important that this be done correctly and accurately. Recorded information will be used to determine the next step in treatment. For the purpose of accuracy, follow these rules: Evaluate only the area treated. Do not report or include pain from an untreated area. For the purpose of this evaluation, ignore all other areas of pain, except for the treated  area. After your procedure, avoid taking a long nap and attempting to complete the pain diary after you wake up. Instead, set your alarm clock to go off every hour, on the hour, for the initial 8 hours after the procedure. Document the duration of the numbing medicine, and the relief you are getting from it. Do not go to sleep and attempt to complete it later. It will not be accurate. If you received sedation, it is likely that you were given a medication that may cause amnesia. Because of this, completing the diary at a later time may cause the information to be inaccurate. This information is needed to plan your care. Follow-up appointment: Keep your post-procedure follow-up evaluation appointment after the procedure (usually 2 weeks for most procedures, 6 weeks for radiofrequencies). DO NOT FORGET to bring you pain diary with you.   Expect: (What should I expect to see with my procedure?) From numbing medicine (AKA: Local Anesthetics): Numbness or decrease in pain. You may also experience some weakness, which if present, could last for the duration of the local anesthetic. Onset: Full effect within 15 minutes of injected. Duration: It will depend on the type of local anesthetic used. On the average, 1 to 8 hours.  From steroids (Applies only if steroids were used): Decrease in swelling or inflammation. Once inflammation is improved, relief of the pain will follow. Onset of benefits: Depends on the amount of swelling present. The more swelling, the longer it will take for the benefits to be seen. In some cases, up to 10 days. Duration: Steroids will stay in the system x 2 weeks. Duration of benefits will depend on multiple posibilities including persistent irritating factors. Side-effects: If present, they   may typically last 2 weeks (the duration of the steroids). Frequent: Cramps (if they occur, drink Gatorade and take over-the-counter Magnesium 450-500 mg once to twice a day); water retention with  temporary weight gain; increases in blood sugar; decreased immune system response; increased appetite. Occasional: Facial flushing (red, warm cheeks); mood swings; menstrual changes. Uncommon: Long-term decrease or suppression of natural hormones; bone thinning. (These are more common with higher doses or more frequent use. This is why we prefer that our patients avoid having any injection therapies in other practices.)  Very Rare: Severe mood changes; psychosis; aseptic necrosis. From procedure: Some discomfort is to be expected once the numbing medicine wears off. This should be minimal if ice and heat are applied as instructed.  Call if: (When should I call?) You experience numbness and weakness that gets worse with time, as opposed to wearing off. New onset bowel or bladder incontinence. (Applies only to procedures done in the spine)  Emergency Numbers: Durning business hours (Monday - Thursday, 8:00 AM - 4:00 PM) (Friday, 9:00 AM - 12:00 Noon): [phone] After hours: [phone] NOTE: If you are having a problem and are unable connect with, or to talk to a provider, then go to your nearest urgent care or emergency department. If the problem is serious and urgent, please call 911. ____________________________________________________________________________________________   ____________________________________________________________________________________________  Opioid Pain Medication Update  To: All patients taking opioid pain medications. (I.e.: hydrocodone, hydromorphone, oxycodone, oxymorphone, morphine, codeine, methadone, tapentadol, tramadol, buprenorphine, fentanyl, etc.)  Re: Updated review of side effects and adverse reactions of opioid analgesics, as well as new information about long term effects of this class of medications.  Direct risks of long-term opioid therapy are not limited to opioid addiction and overdose. Potential medical risks include serious  fractures, breathing problems during sleep, hyperalgesia, immunosuppression, chronic constipation, bowel obstruction, myocardial infarction, and tooth decay secondary to xerostomia.  Unpredictable adverse effects that can occur even if you take your medication correctly: Cognitive impairment, respiratory depression, and death. Most people think that if they take their medication "correctly", and "as instructed", that they will be safe. Nothing could be farther from the truth. In reality, a significant amount of recorded deaths associated with the use of opioids has occurred in individuals that had taken the medication for a long time, and were taking their medication correctly. The following are examples of how this can happen: Patient taking his/her medication for a long time, as instructed, without any side effects, is given a certain antibiotic or another unrelated medication, which in turn triggers a "Drug-to-drug interaction" leading to disorientation, cognitive impairment, impaired reflexes, respiratory depression or an untoward event leading to serious bodily harm or injury, including death.  Patient taking his/her medication for a long time, as instructed, without any side effects, develops an acute impairment of liver and/or kidney function. This will lead to a rapid inability of the body to breakdown and eliminate their pain medication, which will result in effects similar to an "overdose", but with the same medicine and dose that they had always taken. This again may lead to disorientation, cognitive impairment, impaired reflexes, respiratory depression or an untoward event leading to serious bodily harm or injury, including death.  A similar problem will occur with patients as they grow older and their liver and kidney function begins to decrease as part of the aging process.  Background information: Historically, the original case for using long-term opioid therapy to treat chronic noncancer  pain was based on safety assumptions  that subsequent experience has called into question. In 1996, the American Pain Society and the American Academy of Pain Medicine issued a consensus statement supporting long-term opioid therapy. This statement acknowledged the dangers of opioid prescribing but concluded that the risk for addiction was low; respiratory depression induced by opioids was short-lived, occurred mainly in opioid-naive patients, and was antagonized by pain; tolerance was not a common problem; and efforts to control diversion should not constrain opioid prescribing. This has now proven to be wrong. Experience regarding the risks for opioid addiction, misuse, and overdose in community practice has failed to support these assumptions.  According to the Centers for Disease Control and Prevention, fatal overdoses involving opioid analgesics have increased sharply over the past decade. Currently, more than 96,700 people die from drug overdoses every year. Opioids are a factor in 7 out of every 10 overdose deaths. Deaths from drug overdose have surpassed motor vehicle accidents as the leading cause of death for individuals between the ages of 29 and 22.  Clinical data suggest that neuroendocrine dysfunction may be very common in both men and women, potentially causing hypogonadism, erectile dysfunction, infertility, decreased libido, osteoporosis, and depression. Recent studies linked higher opioid dose to increased opioid-related mortality. Controlled observational studies reported that long-term opioid therapy may be associated with increased risk for cardiovascular events. Subsequent meta-analysis concluded that the safety of long-term opioid therapy in elderly patients has not been proven.   Side Effects and adverse reactions: Common side effects: Drowsiness (sedation). Dizziness. Nausea and vomiting. Constipation. Physical dependence -- Dependence often manifests with withdrawal symptoms when  opioids are discontinued or decreased. Tolerance -- As you take repeated doses of opioids, you require increased medication to experience the same effect of pain relief. Respiratory depression -- This can occur in healthy people, especially with higher doses. However, people with COPD, asthma or other lung conditions may be even more susceptible to fatal respiratory impairment.  Uncommon side effects: An increased sensitivity to feeling pain and extreme response to pain (hyperalgesia). Chronic use of opioids can lead to this. Delayed gastric emptying (the process by which the contents of your stomach are moved into your small intestine). Muscle rigidity. Immune system and hormonal dysfunction. Quick, involuntary muscle jerks (myoclonus). Arrhythmia. Itchy skin (pruritus). Dry mouth (xerostomia).  Long-term side effects: Chronic constipation. Sleep-disordered breathing (SDB). Increased risk of bone fractures. Hypothalamic-pituitary-adrenal dysregulation. Increased risk of overdose.  RISKS: Fractures and Falls:  Opioids increase the risk and incidence of falls. This is of particular importance in elderly patients.  Endocrine System:  Long-term administration is associated with endocrine abnormalities (endocrinopathies). (Also known as Opioid-induced Endocrinopathy) Influences on both the hypothalamic-pituitary-adrenal axis?and the hypothalamic-pituitary-gonadal axis have been demonstrated with consequent hypogonadism and adrenal insufficiency in both sexes. Hypogonadism and decreased levels of dehydroepiandrosterone sulfate have been reported in men and women. Endocrine effects include: Amenorrhoea in women (abnormal absence of menstruation) Reduced libido in both sexes Decreased sexual function Erectile dysfunction in men Hypogonadisms (decreased testicular function with shrinkage of testicles) Infertility Depression and fatigue Loss of muscle mass Anxiety Depression Immune  suppression Hyperalgesia Weight gain Anemia Osteoporosis Patients (particularly women of childbearing age) should avoid opioids. There is insufficient evidence to recommend routine monitoring of asymptomatic patients taking opioids in the long-term for hormonal deficiencies.  Immune System: Human studies have demonstrated that opioids have an immunomodulating effect. These effects are mediated via opioid receptors both on immune effector cells and in the central nervous system. Opioids have been demonstrated to have adverse effects on  antimicrobial response and anti-tumour surveillance. Buprenorphine has been demonstrated to have no impact on immune function.  Opioid Induced Hyperalgesia: Human studies have demonstrated that prolonged use of opioids can lead to a state of abnormal pain sensitivity, sometimes called opioid induced hyperalgesia (OIH). Opioid induced hyperalgesia is not usually seen in the absence of tolerance to opioid analgesia. Clinically, hyperalgesia may be diagnosed if the patient on long-term opioid therapy presents with increased pain. This might be qualitatively and anatomically distinct from pain related to disease progression or to breakthrough pain resulting from development of opioid tolerance. Pain associated with hyperalgesia tends to be more diffuse than the pre-existing pain and less defined in quality. Management of opioid induced hyperalgesia requires opioid dose reduction.  Cancer: Chronic opioid therapy has been associated with an increased risk of cancer among noncancer patients with chronic pain. This association was more evident in chronic strong opioid users. Chronic opioid consumption causes significant pathological changes in the small intestine and colon. Epidemiological studies have found that there is a link between opium dependence and initiation of gastrointestinal cancers. Cancer is the second leading cause of death after cardiovascular disease.  Chronic use of opioids can cause multiple conditions such as GERD, immunosuppression and renal damage as well as carcinogenic effects, which are associated with the incidence of cancers.   Mortality: Long-term opioid use has been associated with increased mortality among patients with chronic non-cancer pain (CNCP).  Prescription of long-acting opioids for chronic noncancer pain was associated with a significantly increased risk of all-cause mortality, including deaths from causes other than overdose.  Reference: Von Korff M, Kolodny A, Deyo RA, Chou R. Long-term opioid therapy reconsidered. Ann Intern Med. 2011 Sep 6;155(5):325-8. doi: 10.7326/[id number]. PMID: 57846962; PMCID: [id number]. Randon Goldsmith, Hayward RA, Dunn KM, [place] KP. Risk of adverse events in patients prescribed long-term opioids: A cohort study in the [place] [facility] [employer]. Eur J Pain. 2019 May;23(5):908-922. doi: 10.1002/ejp.1357. Epub 2019 Jan 31. PMID: 01027253. Colameco S, Coren JS, Ci[place]o CA. Continuous opioid treatment for chronic noncancer pain: a time for moderation in prescribing. Postgrad Med. 2009 Jul;121(4):61-6. doi: 10.3810/pgm.2009.07.2032. PMID: 66440347. William Hamburger RN, [place], Blazina I, Cristopher Peru, Bougatsos C, Deyo RA. The effectiveness and risks of long-term opioid therapy for chronic pain: a systematic review for a [employer] to [employer]. Ann Intern Med. 2015 Feb 17;162(4):276-86. doi: 10.7326/M14-2559. PMID: 42595638. Caryl Bis [facility], [place] DM. NCHS Data Brief No. 22. [place]: Centers for Disease Control and Prevention; 2009. Sep, Increase in Fatal Poisonings Involving Opioid Analgesics in the [place], 1999-2006. Song IA, Choi HR, Oh TK. Long-term opioid use and mortality in patients with chronic non-cancer pain: Ten-year follow-up study in [place] from 2010 through 2019.  EClinicalMedicine. 2022 Jul 18;51:101558. doi: 10.1016/j.eclinm.2022.756433. PMID: 29518841; PMCID: [id number]. Huser, W., Schubert, T., Vogelmann, T. et al. All-cause mortality in patients with long-term opioid therapy compared with non-opioid analgesics for chronic non-cancer pain: a database study. BMC Med 18, 162 (2020). http://lester.info/ Rashidian H, Karie Kirks, Malekzadeh R, Haghdoost AA. An Ecological Study of the Association between Opiate Use and Incidence of Cancers. Addict Health. 2016 Fall;8(4):252-260. PMID: 09323557; PMCID: [id number].  Our Goal: Our goal is to control your pain with means other than the use of opioid pain medications.  Our Recommendation: Talk to your physician about coming off of these medications. We can assist you with the tapering down and stopping these medicines. Based  on the new information, even if you cannot completely stop the medication, a decrease in the dose may be associated with a lesser risk. Ask for other means of controlling the pain. Decrease or eliminate those factors that significantly contribute to your pain such as smoking, obesity, and a diet heavily tilted towards "inflammatory" nutrients.  Last Updated: 12/14/2022   ____________________________________________________________________________________________     ____________________________________________________________________________________________  Patient Information update  To: All of our patients.  Re: Name change.  It has been made official that our current name, "[facility]"   will soon be changed to "[facility]".   The purpose of this change is to eliminate any confusion created by the concept of our practice being a "Medication Management Pain Clinic". In the past this has led to the misconception that we treat pain  primarily by the use of prescription medications.  Nothing can be farther from the truth.   Understanding PAIN MANAGEMENT: To further understand what our practice does, you first have to understand that "Pain Management" is a subspecialty that requires additional training once a physician has completed their specialty training, which can be in either Anesthesia, Neurology, Psychiatry, or Physical Medicine and Rehabilitation (PMR). Each one of these contributes to the final approach taken by each physician to the management of their patient's pain. To be a "Pain Management Specialist" you must have first completed one of the specialty trainings below.  Anesthesiologists - trained in clinical pharmacology and interventional techniques such as nerve blockade and regional as well as central neuroanatomy. They are trained to block pain before, during, and after surgical interventions.  Neurologists - trained in the diagnosis and pharmacological treatment of complex neurological conditions, such as Multiple Sclerosis, Parkinson's, spinal cord injuries, and other systemic conditions that may be associated with symptoms that may include but are not limited to pain. They tend to rely primarily on the treatment of chronic pain using prescription medications.  Psychiatrist - trained in conditions affecting the psychosocial wellbeing of patients including but not limited to depression, anxiety, schizophrenia, personality disorders, addiction, and other substance use disorders that may be associated with chronic pain. They tend to rely primarily on the treatment of chronic pain using prescription medications.   Physical Medicine and Rehabilitation (PMR) physicians, also known as physiatrists - trained to treat a wide variety of medical conditions affecting the brain, spinal cord, nerves, bones, joints, ligaments, muscles, and tendons. Their training is primarily aimed at treating patients that have suffered injuries  that have caused severe physical impairment. Their training is primarily aimed at the physical therapy and rehabilitation of those patients. They may also work alongside orthopedic surgeons or neurosurgeons using their expertise in assisting surgical patients to recover after their surgeries.  INTERVENTIONAL PAIN MANAGEMENT is sub-subspecialty of Pain Management.  Our physicians are Board-certified in Anesthesia, Pain Management, and Interventional Pain Management.  This meaning that not only have they been trained and Board-certified in their specialty of Anesthesia, and subspecialty of Pain Management, but they have also received further training in the sub-subspecialty of Interventional Pain Management, in order to become Board-certified as INTERVENTIONAL PAIN MANAGEMENT SPECIALIST.    Mission: Our goal is to use our skills in  INTERVENTIONAL PAIN MANAGEMENT as alternatives to the chronic use of prescription opioid medications for the treatment of pain. To make this more clear, we have changed our name to reflect what we do and offer. We will continue to offer  medication management assessment and recommendations, but we will not be taking over any patient's medication management.  ____________________________________________________________________________________________     ____________________________________________________________________________________________  National Pain Medication Shortage  The [place] is experiencing worsening drug shortages. These have had a negative widespread effect on patient care and treatment. Not expected to improve any time soon. Predicted to last past 2029.   Drug shortage list (generic names) Oxycodone IR Oxycodone/APAP Oxymorphone IR Hydromorphone Hydrocodone/APAP Morphine  Where is the problem?  Manufacturing and supply level.  Will this shortage affect you?  Only if you take any of the above pain medications.  How? You may be unable to fill  your prescription.  Your pharmacist may offer a "partial fill" of your prescription. (Warning: Do not accept partial fills.) Prescriptions partially filled cannot be transferred to another pharmacy. Read our Medication Rules and Regulation. Depending on how much medicine you are dependent on, you may experience withdrawals when unable to get the medication.  Recommendations: Consider ending your dependence on opioid pain medications. Ask your pain specialist to assist you with the process. Consider switching to a medication currently not in shortage, such as Buprenorphine. Talk to your pain specialist about this option. Consider decreasing your pain medication requirements by managing tolerance thru "Drug Holidays". This may help minimize withdrawals, should you run out of medicine. Control your pain thru the use of non-pharmacological interventional therapies.   Your prescriber: Prescribers cannot be blamed for shortages. Medication manufacturing and supply issues cannot be fixed by the prescriber.   NOTE: The prescriber is not responsible for supplying the medication, or solving supply issues. Work with your pharmacist to solve it. The patient is responsible for the decision to take or continue taking the medication and for identifying and securing a legal supply source. By law, supplying the medication is the job and responsibility of the pharmacy. The prescriber is responsible for the evaluation, monitoring, and prescribing of these medications.   Prescribers will NOT: Re-issue prescriptions that have been partially filled. Re-issue prescriptions already sent to a pharmacy.  Re-send prescriptions to a different pharmacy because yours did not have your medication. Ask pharmacist to order more medicine or transfer the prescription to another pharmacy. (Read below.)  New 2023 regulation: "June 17, 2022 Revised Regulation Allows DEA-Registered Pharmacies to Transfer Electronic  Prescriptions at a Patient's Request DEA Headquarters Division - Public Information Office Patients now have the ability to request their electronic prescription be transferred to another pharmacy without having to go back to their practitioner to initiate the request. This revised regulation went into effect on Monday, June 13, 2022.     At a patient's request, a DEA-registered retail pharmacy can now transfer an electronic prescription for a controlled substance (schedules II-V) to another DEA-registered retail pharmacy. Prior to this change, patients would have to go through their practitioner to cancel their prescription and have it re-issued to a different pharmacy. The process was taxing and time consuming for both patients and practitioners.    The Drug Enforcement Administration [facility]) published its intent to revise the process for transferring electronic prescriptions on September 04, 2020.  The final rule was published in the federal register on May 12, 2022 and went into effect 30 days later.  Under the final rule, a prescription can only be transferred once between pharmacies, and only if allowed under existing state or other applicable law. The prescription must remain in its electronic form; may not be altered in any way; and the transfer must be communicated directly  between two licensed pharmacists. It's important to note, any authorized refills transfer with the original prescription, which means the entire prescription will be filled at the same pharmacy".  Reference: [email or web address] [facility] website announcement)  [email or web address] [employer])   [employer] / Vol. 88, No. 143 / Thursday, May 12, 2022 / Rules and Regulations DEPARTMENT OF JUSTICE  Drug Enforcement Administration  21 CFR Part 1306   [Docket No. [id number]]  RIN [id number] Transfer of Electronic Prescriptions for Schedules II-V Controlled Substances Between Pharmacies for Initial Filling  ____________________________________________________________________________________________     ____________________________________________________________________________________________  Transfer of Pain Medication between Pharmacies  Re: 2023 DEA Clarification on existing regulation  Published on DEA Website: June 17, 2022  Title: Revised Regulation Allows DEA-Registered Pharmacies to Electrical engineer Prescriptions at a Patient's Request DEA Headquarters Division - [employer]  "Patients now have the ability to request their electronic prescription be transferred to another pharmacy without having to go back to their practitioner to initiate the request. This revised regulation went into effect on Monday, June 13, 2022.     At a patient's request, a DEA-registered retail pharmacy can now transfer an electronic prescription for a controlled substance (schedules II-V) to another DEA-registered retail pharmacy. Prior to this change, patients would have to go through their practitioner to cancel their prescription and have it re-issued to a different pharmacy. The process was taxing and time consuming for both patients and practitioners.    The Drug Enforcement Administration [facility]) published its intent to revise the process for transferring electronic prescriptions on September 04, 2020.  The final rule was published in the federal register on May 12, 2022 and went into effect 30 days later.  Under the final rule, a prescription can only be transferred once between pharmacies, and only if allowed under existing state or other applicable law. The prescription must remain in its electronic form; may not be altered in any way; and the transfer must be communicated directly between two licensed pharmacists. It's  important to note, any authorized refills transfer with the original prescription, which means the entire prescription will be filled at the same pharmacy."    REFERENCES: 1. DEA website announcement [email or web address]  2. Department of Justice website  [email or web address]  3. DEPARTMENT OF JUSTICE Drug Enforcement Administration 21 CFR Part 1306 [Docket No. [id number]] RIN [id number] "Transfer of Electronic Prescriptions for Schedules II-V Controlled Substances Between Pharmacies for Initial Filling"  ____________________________________________________________________________________________     _______________________________________________________________________  Medication Rules  Purpose: To inform patients, and their family members, of our medication rules and regulations.  Applies to: All patients receiving prescriptions from our practice (written or electronic).  Pharmacy of record: This is the pharmacy where your electronic prescriptions will be sent. Make sure we have the correct one.  Electronic prescriptions: In compliance with the [facility] Strengthen Opioid Misuse Prevention (STOP) Act of 2017 (Session Conni Elliot [phone]), effective October 17, 2018, all controlled substances must be electronically prescribed. Written prescriptions, faxing, or calling prescriptions to a pharmacy will no longer be done.  Prescription refills: These will be provided only during in-person appointments. No medications will be renewed without a "face-to-face" evaluation with your provider. Applies to all prescriptions.  NOTE: The following applies primarily to controlled substances (Opioid* Pain Medications).   Type of encounter (visit): For patients receiving controlled substances, face-to-face visits are required. (Not an option and not up to the  patient.)  Patient's responsibilities: Pain Pills: Bring all pain pills to every appointment (except  for procedure appointments). Pill Bottles: Bring pills in original pharmacy bottle. Bring bottle, even if empty. Always bring the bottle of the most recent fill.  Medication refills: You are responsible for knowing and keeping track of what medications you are taking and when is it that you will need a refill. The day before your appointment: write a list of all prescriptions that need to be refilled. The day of the appointment: give the list to the admitting nurse. Prescriptions will be written only during appointments. No prescriptions will be written on procedure days. If you forget a medication: it will not be "Called in", "Faxed", or "electronically sent". You will need to get another appointment to get these prescribed. No early refills. Do not call asking to have your prescription filled early. Partial  or short prescriptions: Occasionally your pharmacy may not have enough pills to fill your prescription.  NEVER ACCEPT a partial fill or a prescription that is short of the total amount of pills that you were prescribed.  With controlled substances the law allows 72 hours for the pharmacy to complete the prescription.  If the prescription is not completed within 72 hours, the pharmacist will require a new prescription to be written. This means that you will be short on your medicine and we WILL NOT send another prescription to complete your original prescription.  Instead, request the pharmacy to send a carrier to a nearby branch to get enough medication to provide you with your full prescription. Prescription Accuracy: You are responsible for carefully inspecting your prescriptions before leaving our office. Have the discharge nurse carefully go over each prescription with you, before taking them home. Make sure that your name is accurately spelled, that your address is correct. Check the name and  dose of your medication to make sure it is accurate. Check the number of pills, and the written instructions to make sure they are clear and accurate. Make sure that you are given enough medication to last until your next medication refill appointment. Taking Medication: Take medication as prescribed. When it comes to controlled substances, taking less pills or less frequently than prescribed is permitted and encouraged. Never take more pills than instructed. Never take the medication more frequently than prescribed.  Inform other Doctors: Always inform, all of your healthcare providers, of all the medications you take. Pain Medication from other Providers: You are not allowed to accept any additional pain medication from any other Doctor or Healthcare provider. There are two exceptions to this rule. (see below) In the event that you require additional pain medication, you are responsible for notifying us, as stated below. Cough Medicine: Often these contain an opioid, such as codeine or hydrocodone. Never accept or take cough medicine containing these opioids if you are already taking an opioid* medication. The combination may cause respiratory failure and death. Medication Agreement: You are responsible for carefully reading and following our Medication Agreement. This must be signed before receiving any prescriptions from our practice. Safely store a copy of your signed Agreement. Violations to the Agreement will result in no further prescriptions. (Additional copies of our Medication Agreement are available upon request.) Laws, Rules, & Regulations: All patients are expected to follow all [address] and [employer], Rules, [employer]. Ignorance of the Laws does not constitute a valid excuse.  Illegal drugs and Controlled Substances: The use of illegal substances (including, but not limited to marijuana and its derivatives) and/or the illegal use of any controlled substances is strictly  prohibited. Violation  of this rule may result in the immediate and permanent discontinuation of any and all prescriptions being written by our practice. The use of any illegal substances is prohibited. Adopted CDC guidelines & recommendations: Target dosing levels will be at or below 60 MME/day. Use of benzodiazepines** is not recommended.  Exceptions: There are only two exceptions to the rule of not receiving pain medications from other Healthcare Providers. Exception #1 (Emergencies): In the event of an emergency (i.e.: accident requiring emergency care), you are allowed to receive additional pain medication. However, you are responsible for: As soon as you are able, call our office [phone], at any time of the day or night, and leave a message stating your name, the date and nature of the emergency, and the name and dose of the medication prescribed. In the event that your call is answered by a member of our staff, make sure to document and save the date, time, and the name of the person that took your information.  Exception #2 (Planned Surgery): In the event that you are scheduled by another doctor or dentist to have any type of surgery or procedure, you are allowed (for a period no longer than 30 days), to receive additional pain medication, for the acute post-op pain. However, in this case, you are responsible for picking up a copy of our "Post-op Pain Management for Surgeons" handout, and giving it to your surgeon or dentist. This document is available at our office, and does not require an appointment to obtain it. Simply go to our office during business hours (Monday-Thursday from 8:00 AM to 4:00 PM) (Friday 8:00 AM to 12:00 Noon) or if you have a scheduled appointment with us, prior to your surgery, and ask for it by name. In addition, you are responsible for: calling our office [phone], at any time of the day or night, and leaving a message stating your name, name of your surgeon, type  of surgery, and date of procedure or surgery. Failure to comply with your responsibilities may result in termination of therapy involving the controlled substances. Medication Agreement Violation. Following the above rules, including your responsibilities will help you in avoiding a Medication Agreement Violation ("Breaking your Pain Medication Contract").  Consequences:  Not following the above rules may result in permanent discontinuation of medication prescription therapy.  *Opioid medications include: morphine, codeine, oxycodone, oxymorphone, hydrocodone, hydromorphone, meperidine, tramadol, tapentadol, buprenorphine, fentanyl, methadone. **Benzodiazepine medications include: diazepam (Valium), alprazolam (Xanax), clonazepam (Klonopine), lorazepam (Ativan), clorazepate (Tranxene), chlordiazepoxide (Librium), estazolam (Prosom), oxazepam (Serax), temazepam (Restoril), triazolam (Halcion) (Last updated: 08/09/2022) ______________________________________________________________________    ______________________________________________________________________  Medication Recommendations and Reminders  Applies to: All patients receiving prescriptions (written and/or electronic).  Medication Rules & Regulations: You are responsible for reading, knowing, and following our "Medication Rules" document. These exist for your safety and that of others. They are not flexible and neither are we. Dismissing or ignoring them is an act of "non-compliance" that may result in complete and irreversible termination of such medication therapy. For safety reasons, "non-compliance" will not be tolerated. As with the [place] fundamental legal principle of "ignorance of the law is no defense", we will accept no excuses for not having read and knowing the content of documents provided to you by our practice.  Pharmacy of record:  Definition: This is the pharmacy where your electronic prescriptions will be sent.  We do  not endorse any particular pharmacy. It is up to you and your insurance to decide what pharmacy to use.  We do not restrict you in your choice of pharmacy. However, once we write for your prescriptions, we will NOT be re-sending more prescriptions to fix restricted supply problems created by your pharmacy, or your insurance.  The pharmacy listed in the electronic medical record should be the one where you want electronic prescriptions to be sent. If you choose to change pharmacy, simply notify our nursing staff. Changes will be made only during your regular appointments and not over the phone.  Recommendations: Keep all of your pain medications in a safe place, under lock and key, even if you live alone. We will NOT replace lost, stolen, or damaged medication. We do not accept "Police Reports" as proof of medications having been stolen. After you fill your prescription, take 1 week's worth of pills and put them away in a safe place. You should keep a separate, properly labeled bottle for this purpose. The remainder should be kept in the original bottle. Use this as your primary supply, until it runs out. Once it's gone, then you know that you have 1 week's worth of medicine, and it is time to come in for a prescription refill. If you do this correctly, it is unlikely that you will ever run out of medicine. To make sure that the above recommendation works, it is very important that you make sure your medication refill appointments are scheduled at least 1 week before you run out of medicine. To do this in an effective manner, make sure that you do not leave the office without scheduling your next medication management appointment. Always ask the nursing staff to show you in your prescription , when your medication will be running out. Then arrange for the receptionist to get you a return appointment, at least 7 days before you run out of medicine. Do not wait until you have 1 or 2 pills left, to come in. This  is very poor planning and does not take into consideration that we may need to cancel appointments due to bad weather, sickness, or emergencies affecting our staff. DO NOT ACCEPT A "Partial Fill": If for any reason your pharmacy does not have enough pills/tablets to completely fill or refill your prescription, do not allow for a "partial fill". The law allows the pharmacy to complete that prescription within 72 hours, without requiring a new prescription. If they do not fill the rest of your prescription within those 72 hours, you will need a separate prescription to fill the remaining amount, which we will NOT provide. If the reason for the partial fill is your insurance, you will need to talk to the pharmacist about payment alternatives for the remaining tablets, but again, DO NOT ACCEPT A PARTIAL FILL, unless you can trust your pharmacist to obtain the remainder of the pills within 72 hours.  Prescription refills and/or changes in medication(s):  Prescription refills, and/or changes in dose or medication, will be conducted only during scheduled medication management appointments. (Applies to both, written and electronic prescriptions.) No refills on procedure days. No medication will be changed or started on procedure days. No changes, adjustments, and/or refills will be conducted on a procedure day. Doing so will interfere with the diagnostic portion of the procedure. No phone refills. No medications will be "called into the pharmacy". No Fax refills. No weekend refills. No Holliday refills. No after hours refills.  Remember:  Business hours are:  Monday to Thursday 8:00 AM to 4:00 PM Provider's Schedule: Delano Metz, MD - Appointments are:  Medication management:  Monday and Wednesday 8:00 AM to 4:00 PM Procedure day: Tuesday and Thursday 7:30 AM to 4:00 PM Edward Jolly, MD - Appointments are:  Medication management: Tuesday and Thursday 8:00 AM to 4:00 PM Procedure day: Monday and  Wednesday 7:30 AM to 4:00 PM (Last update: 08/09/2022) ______________________________________________________________________    ____________________________________________________________________________________________  Drug Holidays  What is a "Drug Holiday"? Drug Holiday: is the name given to the process of slowly tapering down and temporarily stopping the pain medication for the purpose of decreasing or eliminating tolerance to the drug.  Benefits Improved effectiveness Decreased required effective dose Improved pain control End dependence on high dose therapy Decrease cost of therapy Uncovering "opioid-induced hyperalgesia". (OIH)  What is "opioid hyperalgesia"? It is a paradoxical increase in pain caused by exposure to opioids. Stopping the opioid pain medication, contrary to the expected, it actually decreases or completely eliminates the pain. Ref.: "A comprehensive review of opioid-induced hyperalgesia". Donney Rankins, et.al. Pain Physician. 2011 Mar-Apr;14(2):145-61.  What is tolerance? Tolerance: the progressive loss of effectiveness of a pain medicine due to repetitive use. A common problem of opioid pain medications.  How long should a "Drug Holiday" last? Effectiveness depends on the patient staying off all opioid pain medicines for a minimum of 14 consecutive days. (2 weeks)  How about just taking less of the medicine? Does not work. Will not accomplish goal of eliminating the excess receptors.  How about switching to a different pain medicine? (AKA. "Opioid rotation") Does not work. Creates the illusion of effectiveness by taking advantage of inaccurate equivalent dose calculations between different opioids. -This "technique" was promoted by studies funded by [employer], such as [employer], creators of "OxyContin".  Can I stop the medicine "cold [place]"? We do not recommend it. You should always coordinate with your prescribing physician to make  the transition as smoothly as possible. Avoid stopping the medicine abruptly without consulting. We recommend a "slow taper".  What is a slow taper? Taper: refers to the gradual decrease in dose.   How do I stop/taper the dose? Slowly. Decrease the daily amount of pills that you take by one (1) pill every seven (7) days. This is called a "slow downward taper". Example: if you normally take four (4) pills per day, drop it to three (3) pills per day for seven (7) days, then to two (2) pills per day for seven (7) days, then to one (1) per day for seven (7) days, and then stop the medicine. The 14 day "Drug Holiday" starts on the first day without medicine.   Will I experience withdrawals? Unlikely with a slow taper.  What triggers withdrawals? Withdrawals are triggered by the sudden/abrupt stop of high dose opioids. Withdrawals can be avoided by slowly decreasing the dose over a prolonged period of time.  What are withdrawals? Symptoms associated with sudden/abrupt reduction/stopping of high-dose, long-term use of pain medication. Withdrawal are seldom seen on low dose therapy, or patients rarely taking opioid medication.  Early Withdrawal Symptoms may include: Agitation Anxiety Muscle aches Increased tearing Insomnia Runny nose Sweating Yawning  Late symptoms may include: Abdominal cramping Diarrhea Dilated pupils Goose bumps Nausea Vomiting  When could I see withdrawals? Onset: 8-24 hours after last use for most opioids. 12-48 hours for long-acting opioids (i.e.: methadone)  How long could they last? Duration: 4-10 days for most opioids. 14-21 days for long-acting opioids (i.e.: methadone)  What will happen after I complete my "Drug Holiday"? The need and indications for the opioid analgesic will be  reviewed before restarting the medication. Dose requirements will likely decrease and the dose will need to be adjusted accordingly.   (Last update:  01/04/2023) ____________________________________________________________________________________________    ____________________________________________________________________________________________  WARNING: CBD (cannabidiol) & Delta (Delta-8 tetrahydrocannabinol) products.   Applicable to:  All individuals currently taking or considering taking CBD (cannabidiol) and, more important, all patients taking opioid analgesic controlled substances (pain medication). (Example: oxycodone; oxymorphone; hydrocodone; hydromorphone; morphine; methadone; tramadol; tapentadol; fentanyl; buprenorphine; butorphanol; dextromethorphan; meperidine; codeine; etc.)  Introduction:  Recently there has been a drive towards the use of "natural" products for the treatment of different conditions, including pain anxiety and sleep disorders. Marijuana and hemp are two varieties of the cannabis genus plants. Marijuana and its derivatives are illegal, while hemp and its derivatives are not. Cannabidiol (CBD) and tetrahydrocannabinol (THC), are two natural compounds found in plants of the Cannabis genus. They can both be extracted from hemp or marijuana. Both compounds interact with your body's endocannabinoid system in very different ways. CBD is associated with pain relief (analgesia) while THC is associated with the psychoactive effects ("the high") obtained from the use of marijuana products. There are two main types of THC: Delta-9, which comes from the marijuana plant and it is illegal, and Delta-8, which comes from the hemp plant, and it is legal. (Both, Delta-9-THC and Delta-8-THC are psychoactive and give you "the high".)   Legality:  Marijuana and its derivatives: illegal Hemp and its derivatives: Legal (State dependent) UPDATE: (12/03/2021) The Drug Enforcement Agency (DEA) issued a letter stating that "delta" cannabinoids, including Delta-8-THCO and Delta-9-THCO, synthetically derived from hemp do not qualify as  hemp and will be viewed as Schedule I drugs. (Schedule I drugs, substances, or chemicals are defined as drugs with no currently accepted medical use and a high potential for abuse. Some examples of Schedule I drugs are: heroin, lysergic acid diethylamide (LSD), marijuana (cannabis), 3,4-methylenedioxymethamphetamine (ecstasy), methaqualone, and peyote.) (CueTune.com.ee)  Legal status of CBD in [place]:  "Conditionally Legal"  Reference: "FDA Regulation of Cannabis and Cannabis-Derived Products, Including Cannabidiol (CBD)" - OEMDeals.dk  Warning:  CBD is not FDA approved and has not undergo the same manufacturing controls as prescription drugs.  This means that the purity and safety of available CBD may be questionable. Most of the time, despite manufacturer's claims, it is contaminated with THC (delta-9-tetrahydrocannabinol - the chemical in marijuana responsible for the "HIGH").  When this is the case, the [facility] contaminant will trigger a positive urine drug screen (UDS) test for Marijuana (carboxy-THC).   The FDA recently put out a warning about 5 things that everyone should be aware of regarding Delta-8 THC: Delta-8 THC products have not been evaluated or approved by the FDA for safe use and may be marketed in ways that put the public health at risk. The FDA has received adverse event reports involving delta-8 THC-containing products. Delta-8 THC has psychoactive and intoxicating effects. Delta-8 THC manufacturing often involve use of potentially harmful chemicals to create the concentrations of delta-8 THC claimed in the marketplace. The final delta-8 THC product may have potentially harmful by-products (contaminants) due to the chemicals used in the process. Manufacturing of delta-8 THC products may occur in uncontrolled or unsanitary settings, which may lead to the presence of  unsafe contaminants or other potentially harmful substances. Delta-8 THC products should be kept out of the reach of children and pets.  NOTE: Because a positive UDS for any illicit substance is a violation of our medication agreement, your opioid analgesics (pain medicine) may be  permanently discontinued.  MORE ABOUT CBD  General Information: CBD was discovered in 76 and it is a derivative of the cannabis sativa genus plants (Marijuana and Hemp). It is one of the 113 identified substances found in Marijuana. It accounts for up to 40% of the plant's extract. As of 2018, preliminary clinical studies on CBD included research for the treatment of anxiety, movement disorders, and pain. CBD is available and consumed in multiple forms, including inhalation of smoke or vapor, as an aerosol spray, and by mouth. It may be supplied as an oil containing CBD, capsules, dried cannabis, or as a liquid solution. CBD is thought not to be as psychoactive as THC (delta-9-tetrahydrocannabinol - the chemical in marijuana responsible for the "HIGH"). Studies suggest that CBD may interact with different biological target receptors in the body, including cannabinoid and other neurotransmitter receptors. As of 2018 the mechanism of action for its biological effects has not been determined.  Side-effects  Adverse reactions: Dry mouth, diarrhea, decreased appetite, fatigue, drowsiness, malaise, weakness, sleep disturbances, and others.  Drug interactions:  CBD may interact with medications such as blood-thinners. CBD causes drowsiness on its own and it will increase drowsiness caused by other medications, including antihistamines (such as Benadryl), benzodiazepines (Xanax, Ativan, Valium), antipsychotics, antidepressants, opioids, alcohol and supplements such as kava, melatonin and St. John's Wort.  Other drug interactions: Brivaracetam (Briviact); Caffeine; Carbamazepine (Tegretol); Citalopram (Celexa); Clobazam (Onfi);  Eslicarbazepine (Aptiom); Everolimus (Zostress); Lithium; Methadone (Dolophine); Rufinamide (Banzel); Sedative medications (CNS depressants); Sirolimus (Rapamune); Stiripentol (Diacomit); Tacrolimus (Prograf); Tamoxifen ; Soltamox); Topiramate (Topamax); Valproate; Warfarin (Coumadin); Zonisamide. (Last update: 09/26/2022) ____________________________________________________________________________________________   ____________________________________________________________________________________________  Naloxone Nasal Spray  Why am I receiving this medication? [place] STOP ACT requires that all patients taking high dose opioids or at risk of opioids respiratory depression, be prescribed an opioid reversal agent, such as Naloxone (AKA: Narcan).  What is this medication? NALOXONE (nal OX one) treats opioid overdose, which causes slow or shallow breathing, severe drowsiness, or trouble staying awake. Call emergency services after using this medication. You may need additional treatment. Naloxone works by reversing the effects of opioids. It belongs to a group of medications called opioid blockers.  COMMON BRAND NAME(S): Kloxxado, Narcan  What should I tell my care team before I take this medication? They need to know if you have any of these conditions: Heart disease Substance use disorder An unusual or allergic reaction to naloxone, other medications, foods, dyes, or preservatives Pregnant or trying to get pregnant Breast-feeding  When to use this medication? This medication is to be used for the treatment of respiratory depression (less than 8 breaths per minute) secondary to opioid overdose.   How to use this medication? This medication is for use in the nose. Lay the person on their back. Support their neck with your hand and allow the head to tilt back before giving the medication. The nasal spray should be given into 1 nostril. After giving the medication, move the person  onto their side. Do not remove or test the nasal spray until ready to use. Get emergency medical help right away after giving the first dose of this medication, even if the person wakes up. You should be familiar with how to recognize the signs and symptoms of a narcotic overdose. If more doses are needed, give the additional dose in the other nostril. Talk to your care team about the use of this medication in children. While this medication may be prescribed for children as young as newborns  for selected conditions, precautions do apply.  Naloxone Overdosage: If you think you have taken too much of this medicine contact a poison control center or emergency room at once.  NOTE: This medicine is only for you. Do not share this medicine with others.  What if I miss a dose? This does not apply.  What may interact with this medication? This is only used during an emergency. No interactions are expected during emergency use. This list may not describe all possible interactions. Give your health care provider a list of all the medicines, herbs, non-prescription drugs, or dietary supplements you use. Also tell them if you smoke, drink alcohol, or use illegal drugs. Some items may interact with your medicine.  What should I watch for while using this medication? Keep this medication ready for use in the case of an opioid overdose. Make sure that you have the phone number of your care team and local hospital ready. You may need to have additional doses of this medication. Each nasal spray contains a single dose. Some emergencies may require additional doses. After use, bring the treated person to the nearest hospital or call 911. Make sure the treating care team knows that the person has received a dose of this medication. You will receive additional instructions on what to do during and after use of this medication before an emergency occurs.  What side effects may I notice from receiving this  medication? Side effects that you should report to your care team as soon as possible: Allergic reactions--skin rash, itching, hives, swelling of the face, lips, tongue, or throat Side effects that usually do not require medical attention (report these to your care team if they continue or are bothersome): Constipation Dryness or irritation inside the nose Headache Increase in blood pressure Muscle spasms Stuffy nose Toothache This list may not describe all possible side effects. Call your doctor for medical advice about side effects. You may report side effects to FDA at 1-800-FDA-1088.  Where should I keep my medication? Because this is an emergency medication, you should keep it with you at all times.  Keep out of the reach of children and pets. Store between 20 and 25 degrees C (68 and 77 degrees F). Do not freeze. Throw away any unused medication after the expiration date. Keep in original box until ready to use.  NOTE: This sheet is a summary. It may not cover all possible information. If you have questions about this medicine, talk to your doctor, pharmacist, or health care provider.   2023 Elsevier/Gold Standard (2021-06-11 00:00:00)  ____________________________________________________________________________________________

## 2023-01-25 ENCOUNTER — Encounter: Payer: Medicare HMO | Admitting: Pain Medicine

## 2023-01-25 ENCOUNTER — Telehealth: Payer: Self-pay

## 2023-01-25 NOTE — Telephone Encounter (Signed)
Post procedure follow up.  No answer.  

## 2023-01-31 DIAGNOSIS — J439 Emphysema, unspecified: Secondary | ICD-10-CM | POA: Diagnosis not present

## 2023-01-31 DIAGNOSIS — Z720 Tobacco use: Secondary | ICD-10-CM | POA: Diagnosis not present

## 2023-01-31 DIAGNOSIS — M5481 Occipital neuralgia: Secondary | ICD-10-CM | POA: Diagnosis not present

## 2023-01-31 DIAGNOSIS — G4733 Obstructive sleep apnea (adult) (pediatric): Secondary | ICD-10-CM | POA: Diagnosis not present

## 2023-01-31 DIAGNOSIS — M545 Low back pain, unspecified: Secondary | ICD-10-CM | POA: Diagnosis not present

## 2023-01-31 DIAGNOSIS — G8929 Other chronic pain: Secondary | ICD-10-CM | POA: Diagnosis not present

## 2023-02-02 ENCOUNTER — Encounter: Payer: Medicare HMO | Admitting: Family

## 2023-02-03 ENCOUNTER — Other Ambulatory Visit: Payer: Self-pay | Admitting: Neurology

## 2023-02-03 DIAGNOSIS — M5481 Occipital neuralgia: Secondary | ICD-10-CM

## 2023-02-05 NOTE — Progress Notes (Unsigned)
Patient ID: Theresa [name], female    DOB: September 01, 1956, 67 y.o.   MRN: [id number]  Primary cardiologist: Dorothyann Peng, MD (last seen 01/24) PCP: Dorothey Baseman, MD (last seen 11/23) HF cardiologist: Arvilla Meres, MD (last seen 12/23)  HPI  Theresa [name] is a 67 y/o female with a history of chronic low back pain, HTN, depression, COPD on long-term oxygen, anxiety, carpal tunnel syndrome, long-standing tobacco use and chronic heart failure.  Echo 04/29/22: EF of 60-65% along with mild MR and no thrombus. Echo 08/08/21: EF of 60-65% along with mild LAE. Echo 09/03/20:EF of >55% with mild LAE and severe TR. Echo 07/07/2019: EF of 60-65% along with mild TR and moderately elevated PA pressure. Echo 11/03/15: but unable to access results. Echo 02/19/15: EF of 50% without valvular stenosis.   Catheterization done 07/11/2019 showed:  Prox RCA to Dist RCA lesion is 50% stenosed. Mid LAD lesion is 50% stenosed. Ost LM to Dist LM lesion is 10% stenosed. Hemodynamic findings consistent with moderate pulmonary hypertension. Normal overall left ventricular function of at least 60% Moderate coronary disease no significant obstructive disease Moderate pulmonary hypertension  Admitted 11/07/22 due to epistaxis and COPD exacerbation. Admitted 11/05/22 due to epistaxis. Admitted 08/09/22 due to cough and worsening SOB due to COPD exacerbation due to RSV. Hypotension due to overdiuresis. Blood transfusion given due to hemoglobin <7 due to slow GIB. Initially need increased oxygen at 8L but able to be weaned down to baseline 4L. [place] negative for DVT. Discharged after 17 days.   She presents today for a HF follow-up visit with a chief complaint of minimal fatigue with moderate exertion. Chronic in nature. Has associated SOB, chronic pain, difficulty sleeping, anxiety, headaches, light-headedness and slight weight gain along with this. Denies abdominal distention, palpitations, pedal edema, chest pain, wheezing or  cough.   Says that she ran out of chantix and has started smoking more. No issues/ side effects with taking chantix but just ran out of refills.   Last used furoscix ~ 1 month ago and asks for a new RX for this to have it on hand.   Has been seeing neurology regarding her headaches. Has upcoming MRI to be done.   Past Medical History:  Diagnosis Date   Acute postoperative pain 10/03/2017   Anxiety    BiPAP (biphasic positive airway pressure) dependence    at hs   Carpal tunnel syndrome    CHF (congestive heart failure)    1/18   CHF (congestive heart failure)    Chronic generalized abdominal pain    Chronic rhinitis    COPD (chronic obstructive pulmonary disease)    DDD (degenerative disc disease), cervical    DDD (degenerative disc disease), lumbosacral    Depression    Dyspnea    Edema    Flu    1/18   Gastritis    GERD (gastroesophageal reflux disease)    Hematuria    Hernia of abdominal wall 07/17/2018   Hernia, abdominal    Hypertension    Kidney stones    Low back pain    Lumbar radiculitis    Malodorous urine    Muscle weakness    Obesity    Oxygen dependent    Pneumonia due to COVID-19 virus 02/03/2020   Renal cyst    Sensory urge incontinence    Thyroid activity decreased    9/19   Tobacco abuse    Wheezing    Past Surgical History:  Procedure Laterality Date  ABDOMINAL HYSTERECTOMY     CARDIOVERSION N/A 04/29/2022   Procedure: CARDIOVERSION;  Surgeon: Lamar Blinks, MD;  Location: [facility] OR[facility];  Service: Cardiovascular;  Laterality: N/A;   CARPAL TUNNEL RELEASE Left 2012   cataract surgery Bilateral    EPIGASTRIC HERNIA REPAIR N/A 08/13/2018   HERNIA REPAIR EPIGASTRIC ADULT; polypropylene mesh reinforcement.  Surgeon: Earline Mayotte, MD;  Location: [facility];  Service: General;  Laterality: N/A;   ESOPHAGOGASTRODUODENOSCOPY N/A 06/21/2022   Procedure: ESOPHAGOGASTRODUODENOSCOPY (EGD);  Surgeon: Toledo, Boykin Nearing, MD;  Location: [facility];   Service: Gastroenterology;  Laterality: N/A;   RIGHT/LEFT HEART CATH AND CORONARY ANGIOGRAPHY N/A 07/11/2019   Procedure: RIGHT/LEFT HEART CATH AND CORONARY ANGIOGRAPHY;  Surgeon: Alwyn Pea, MD;  Location: [facility];  Service: Cardiovascular;  Laterality: N/A;   TEE WITHOUT CARDIOVERSION N/A 04/29/2022   Procedure: TRANSESOPHAGEAL ECHOCARDIOGRAM (TEE);  Surgeon: Lamar Blinks, MD;  Location: [facility];  Service: Cardiovascular;  Laterality: N/A;   TONSILLECTOMY     TUBAL LIGATION     UMBILICAL HERNIA REPAIR N/A 08/13/2018   HERNIA REPAIR UMBILICAL ADULT; polypropylene mesh reinforcement Surgeon: Earline Mayotte, MD;  Location: [facility];  Service: General;  Laterality: N/A;   Family History  Problem Relation Age of Onset   Heart disease Mother    Stroke Mother    Coronary artery disease Mother    Alcohol abuse Father    Heart disease Father    Lung cancer Sister    Cancer Sister    Breast cancer Sister    Cancer Brother    Cancer Brother    Kidney cancer Brother    Pneumonia Brother    Prostate cancer Neg Hx    Bladder Cancer Neg Hx    Social History   Tobacco Use   Smoking status: Some Days    Packs/day: 1.50    Years: 44.00    Additional pack years: 0.00    Total pack years: 66.00    Types: Cigarettes   Smokeless tobacco: Never   Tobacco comments:    over a half pack  Substance Use Topics   Alcohol use: No   Allergies  Allergen Reactions   Gabapentin Other (See Comments)    Higher doses of 800 mg, 600 mg, 300 mg causes excessive sedation and contributes to numerous mechanical falls. (Unsure if 100 mg dose can be tolerated)   Codeine Nausea And Vomiting   Meloxicam Other (See Comments)    Stomach pain   Prior to Admission medications   Medication Sig Start Date End Date Taking? Authorizing Provider  acetaminophen (TYLENOL) 500 MG tablet Take 1,000 mg by mouth every 8 (eight) hours as needed for mild pain or headache.   Yes [provider]  albuterol (VENTOLIN HFA) 108 (90 Base) MCG/ACT inhaler Inhale 2 puffs into the lungs every 6 (six) hours as needed for wheezing or shortness of breath. 10/23/19  Yes Veronese, [place], MD  amiodarone (PACERONE) 200 MG tablet Take 1 tablet (200 mg total) by mouth daily. 04/30/22  Yes Marrion Coy, MD  busPIRone (BUSPAR) 5 MG tablet TAKE 1 TABLET BY MOUTH TWICE DAILY 09/03/22  Yes Malva Limes, MD  butalbital-acetaminophen-caffeine (FIORICET) [phone] MG tablet Take 1 tablet by mouth every 6 (six) hours as needed for headache. LIMIT USE TO PREVENT MEDICATION OVERUSE HEADACHE 04/09/22  Yes Sunnie Nielsen, DO  Cholecalciferol 50 MCG (2000 UT) CAPS Take 2,000 Units by mouth daily.    Yes [provider]  dapagliflozin propanediol (FARXIGA) 10 MG TABS tablet Take 1 tablet (10 mg total) by mouth daily before breakfast. 08/31/22  Yes Clarisa Kindred A, FNP  diclofenac Sodium (VOLTAREN) 1 % GEL Apply 2 g topically 4 (four) times daily as needed (lower back and/or shoulder pain).   Yes [provider]  diltiazem (CARDIZEM CD) 180 MG 24 hr capsule Take 180 mg by mouth daily.   Yes [provider]  ferrous sulfate 325 (65 FE) MG EC tablet Take 1 tablet (325 mg total) by mouth 2 (two) times daily with a meal. Patient taking differently: Take 325 mg by mouth daily with breakfast. 11/09/22  Yes Arnetha Courser, MD  Fluticasone-Umeclidin-Vilant 100-62.5-25 MCG/INH AEPB Inhale 1 puff into the lungs daily.   Yes [provider]  FUROSCIX 80 MG/10ML CTKT Inject into the skin. 09/02/22  Yes [provider]  HYDROcodone-acetaminophen (NORCO/VICODIN) 5-325 MG tablet Take 1 tablet by mouth every 8 (eight) hours as needed for severe pain. Must last 30 days 01/29/23 02/28/23 Yes Delano Metz, MD  HYDROcodone-acetaminophen (NORCO/VICODIN) 5-325 MG tablet Take 1 tablet by mouth every 8 (eight) hours as needed for severe pain. Must last 30 days 02/28/23 03/30/23 Yes  Delano Metz, MD  HYDROcodone-acetaminophen (NORCO/VICODIN) 5-325 MG tablet Take 1 tablet by mouth every 8 (eight) hours as needed for severe pain. Must last 30 days 03/30/23 04/29/23 Yes Delano Metz, MD  ipratropium-albuterol (DUONEB) 0.5-2.5 (3) MG/3ML SOLN USE 1 VIAL VIA NEBULIZER EVERY 6 HOURS AS NEEDED FOR SHORTNESS OF BREATH OR WHEEZING 11/30/20  Yes Chrismon, Jodell Cipro, PA-C  melatonin 5 MG TABS Take 5 mg by mouth at bedtime.   Yes [provider]  naloxone (NARCAN) nasal spray 4 mg/0.1 mL Place 1 spray into the nose as needed for up to 365 doses (for opioid-induced respiratory depresssion). In case of emergency (overdose), spray once into each nostril. If no response within 3 minutes, repeat application and call 911. 08/03/22 08/03/23 Yes Delano Metz, MD  omeprazole (PRILOSEC) 40 MG capsule Take 40 mg by mouth daily. 11/01/22 11/01/23 Yes [provider]  polyethylene glycol (MIRALAX / GLYCOLAX) 17 g packet Take 17 g by mouth daily as needed for mild constipation or moderate constipation.   Yes [provider]  potassium chloride SA (KLOR-CON M) 20 MEQ tablet Take 2 tablets every morning and 1 tablet every evening 06/22/22  Yes Ozil Stettler A, FNP  pregabalin (LYRICA) 75 MG capsule Take 1 capsule (75 mg total) by mouth 3 (three) times daily. 12/12/22 03/12/23 Yes Delano Metz, MD  roflumilast (DALIRESP) 500 MCG TABS tablet Take 500 mcg by mouth daily.   Yes [provider]  sertraline (ZOLOFT) 100 MG tablet TAKE 2 TABLETS BY MOUTH ONCE EVERY MORNING 10/03/22  Yes Malva Limes, MD  simvastatin (ZOCOR) 20 MG tablet Take 20 mg by mouth at bedtime.   Yes [provider]  spironolactone (ALDACTONE) 25 MG tablet Take 25 mg by mouth daily.   Yes [provider]  torsemide (DEMADEX) 20 MG tablet Take 40-80 mg by mouth See admin instructions. Take 4 tablets ( ) by mouth every morning and take 2 tablets ( ) by mouth every  afternoon   Yes [provider]  varenicline (CHANTIX CONTINUING MONTH PAK) 1 MG tablet Take 1 tablet (1 mg total) by mouth 2 (two) times daily. 02/06/23  Yes Delma Freeze, FNP   Review of Systems  Constitutional:  Positive for fatigue. Negative for appetite change.  Eyes: Negative.   Respiratory:  Positive for shortness of breath. Negative for cough, chest tightness and wheezing.   Cardiovascular:  Negative for chest pain, palpitations and leg swelling.  Gastrointestinal:  Negative for abdominal distention, abdominal pain and nausea.  Endocrine: Negative.   Genitourinary: Negative.   Musculoskeletal:  Positive for arthralgias (right shoulder) and back pain.  Skin: Negative.   Allergic/Immunologic: Negative.   Neurological:  Positive for light-headedness and headaches. Negative for dizziness.  Hematological:  Negative for adenopathy. Bruises/bleeds easily.  Psychiatric/Behavioral:  Positive for sleep disturbance (wearing oxygen & bipap at night). Negative for agitation, dysphoric mood and suicidal ideas. The patient is nervous/anxious.    Vitals:   02/06/23 1256  BP: 117/61  Pulse: 87  SpO2: 92%  Weight: 217 lb 2 oz (98.5 kg)   Wt Readings from Last 3 Encounters:  02/06/23 217 lb 2 oz (98.5 kg)  01/24/23 207 lb (93.9 kg)  12/27/22 207 lb (93.9 kg)   Lab Results  Component Value Date   CREATININE 0.71 11/08/2022   CREATININE 0.84 11/07/2022   CREATININE 0.71 11/06/2022   Physical Exam Vitals and nursing note reviewed.  Constitutional:      Appearance: She is well-developed.  HENT:     Head: Normocephalic and atraumatic.  Neck:     Vascular: No JVD.  Cardiovascular:     Rate and Rhythm: Normal rate and regular rhythm.  Pulmonary:     Effort: Pulmonary effort is normal.     Breath sounds: No wheezing, rhonchi or rales.  Abdominal:     General: There is no distension.     Palpations: Abdomen is soft.     Tenderness: There is no abdominal tenderness.   Musculoskeletal:        General: No tenderness.     Cervical back: Normal range of motion and neck supple.     Right lower leg: No edema.     Left lower leg: No edema.  Skin:    General: Skin is warm and dry.  Neurological:     General: No focal deficit present.     Mental Status: She is alert and oriented to person, place, and time.  Psychiatric:        Mood and Affect: Mood normal. Mood is not depressed.        Behavior: Behavior normal.        Thought Content: Thought content normal.   Assessment & Plan:  1: NICM with preserved ejection fraction- - NYHA class II - euvolemic today - weighing daily & weight chart reviewed & shows fluctuation from 207-215 pounds; reminded to call for an overnight weight gain of >2 pounds or a weekly weight gain of >5 pounds - weight up 7 pounds from last visit here 2 months ago - continue farxiga  daily - continue torsemide  AM/  PM; continue potassium AM/ PM - continue spironolactone  daily - furoscix used ~ 1 month ago; new RX sent in to company - saw [facility] provider (Bensimhon) 12/23 - wearing compression socks daily  - BNP 08/31/22 was 36.4  2: HTN- - BP 117/61 - saw PCP (Bronstein) 11/23 - BMP from 11/08/22 reviewed and showed sodium 139, potassium 3.9, creatinine 0.71 and GFR >60  3: COPD- - wearing bipap nightly - oxygen at 3-4L at rest and 5L when showering - saw pulmonology Meredeth Ide) 04/24  4: Tobacco use- - smoking 1 ppd; was smoking less until she ran out of chantix - refilled chantix today - removes herself from the oxygen  when smoking  5: Atrial flutter- - previous cardioversion done - saw cardiologist Juliann Pares) 01/24; returns 07/24 - continue diltazem CD 180mg  daily - continue amiodarone 200mg  daily  6: Pulmonary HTN- - mild to moderate on cath in 2020 - cath with high output physiology. No shunts on TEE. No evidence of anomalous PV drainage or other shunts on CT - CT chest 2023 no PE.  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  - no role for selective vasodilators  - continue O2 and Bipap support  Return in 6 months, sooner if needed.

## 2023-02-06 ENCOUNTER — Encounter: Payer: Self-pay | Admitting: Family

## 2023-02-06 ENCOUNTER — Ambulatory Visit: Payer: Medicare HMO | Attending: Family | Admitting: Family

## 2023-02-06 VITALS — BP 117/61 | HR 87 | Wt 217.1 lb

## 2023-02-06 DIAGNOSIS — F32A Depression, unspecified: Secondary | ICD-10-CM | POA: Diagnosis not present

## 2023-02-06 DIAGNOSIS — F17210 Nicotine dependence, cigarettes, uncomplicated: Secondary | ICD-10-CM | POA: Diagnosis not present

## 2023-02-06 DIAGNOSIS — I110 Hypertensive heart disease with heart failure: Secondary | ICD-10-CM | POA: Insufficient documentation

## 2023-02-06 DIAGNOSIS — G8929 Other chronic pain: Secondary | ICD-10-CM | POA: Insufficient documentation

## 2023-02-06 DIAGNOSIS — Z79899 Other long term (current) drug therapy: Secondary | ICD-10-CM | POA: Insufficient documentation

## 2023-02-06 DIAGNOSIS — J9610 Chronic respiratory failure, unspecified whether with hypoxia or hypercapnia: Secondary | ICD-10-CM | POA: Diagnosis not present

## 2023-02-06 DIAGNOSIS — J449 Chronic obstructive pulmonary disease, unspecified: Secondary | ICD-10-CM

## 2023-02-06 DIAGNOSIS — F419 Anxiety disorder, unspecified: Secondary | ICD-10-CM | POA: Diagnosis not present

## 2023-02-06 DIAGNOSIS — I509 Heart failure, unspecified: Secondary | ICD-10-CM | POA: Diagnosis not present

## 2023-02-06 DIAGNOSIS — Z720 Tobacco use: Secondary | ICD-10-CM | POA: Diagnosis not present

## 2023-02-06 DIAGNOSIS — I10 Essential (primary) hypertension: Secondary | ICD-10-CM

## 2023-02-06 DIAGNOSIS — I428 Other cardiomyopathies: Secondary | ICD-10-CM | POA: Insufficient documentation

## 2023-02-06 DIAGNOSIS — I272 Pulmonary hypertension, unspecified: Secondary | ICD-10-CM

## 2023-02-06 DIAGNOSIS — I4892 Unspecified atrial flutter: Secondary | ICD-10-CM | POA: Diagnosis not present

## 2023-02-06 DIAGNOSIS — I5032 Chronic diastolic (congestive) heart failure: Secondary | ICD-10-CM

## 2023-02-06 MED ORDER — VARENICLINE TARTRATE 1 MG PO TABS: 1.0000 mg | ORAL_TABLET | Freq: Two times a day (BID) | ORAL | 5 refills | Status: DC

## 2023-02-07 ENCOUNTER — Ambulatory Visit: Payer: Medicare HMO | Admitting: Pain Medicine

## 2023-02-07 ENCOUNTER — Ambulatory Visit: Payer: Medicare HMO | Attending: Pain Medicine | Admitting: Pain Medicine

## 2023-02-07 ENCOUNTER — Encounter: Payer: Self-pay | Admitting: Oncology

## 2023-02-07 ENCOUNTER — Encounter: Payer: Self-pay | Admitting: Pain Medicine

## 2023-02-07 VITALS — BP 118/65 | HR 86 | Temp 97.0°F | Resp 18 | Ht 62.0 in | Wt 217.0 lb

## 2023-02-07 DIAGNOSIS — G8929 Other chronic pain: Secondary | ICD-10-CM | POA: Diagnosis not present

## 2023-02-07 DIAGNOSIS — M79605 Pain in left leg: Secondary | ICD-10-CM

## 2023-02-07 DIAGNOSIS — M5416 Radiculopathy, lumbar region: Secondary | ICD-10-CM | POA: Insufficient documentation

## 2023-02-07 DIAGNOSIS — M545 Low back pain, unspecified: Secondary | ICD-10-CM | POA: Diagnosis not present

## 2023-02-07 DIAGNOSIS — M5417 Radiculopathy, lumbosacral region: Secondary | ICD-10-CM | POA: Insufficient documentation

## 2023-02-07 DIAGNOSIS — M47817 Spondylosis without myelopathy or radiculopathy, lumbosacral region: Secondary | ICD-10-CM | POA: Diagnosis not present

## 2023-02-07 DIAGNOSIS — M4727 Other spondylosis with radiculopathy, lumbosacral region: Secondary | ICD-10-CM | POA: Diagnosis not present

## 2023-02-07 DIAGNOSIS — G894 Chronic pain syndrome: Secondary | ICD-10-CM

## 2023-02-07 DIAGNOSIS — Z09 Encounter for follow-up examination after completed treatment for conditions other than malignant neoplasm: Secondary | ICD-10-CM | POA: Insufficient documentation

## 2023-02-07 DIAGNOSIS — R937 Abnormal findings on diagnostic imaging of other parts of musculoskeletal system: Secondary | ICD-10-CM | POA: Diagnosis not present

## 2023-02-07 DIAGNOSIS — M4726 Other spondylosis with radiculopathy, lumbar region: Secondary | ICD-10-CM

## 2023-02-07 DIAGNOSIS — M47816 Spondylosis without myelopathy or radiculopathy, lumbar region: Secondary | ICD-10-CM | POA: Diagnosis not present

## 2023-02-07 NOTE — Progress Notes (Signed)
PROVIDER NOTE: Information contained herein reflects review and annotations entered in association with encounter. Interpretation of such information and data should be left to medically-trained personnel. Information provided to patient can be located elsewhere in the medical record under "Patient Instructions". Document created using STT-dictation technology, any transcriptional errors that may result from process are unintentional.    Patient: Theresa [name]  Service Category: E/M  Provider: Oswaldo Done, MD  DOB: November 27, 1955  DOS: 02/07/2023  Referring Provider: Dorothey Baseman, MD  MRN: [id number]  Specialty: Interventional Pain Management  PCP: Patient, No Pcp Per  Type: Established Patient  Setting: Ambulatory outpatient    Location: Office  Delivery: Face-to-face     HPI  Ms. PATIENCE NUZZO, a 67 y.o. year old female, is here today because of her Chronic pain of left lower extremity [M79.605, G89.29]. Ms. Creswell primary complain today is Back Pain  Pertinent problems: Ms. Buerger has Neuritis or radiculitis due to rupture of lumbar intervertebral disc; DDD (degenerative disc disease), lumbar; Carpal tunnel syndrome; Neurogenic pain; Neuropathic pain; Musculoskeletal pain; Lumbar spondylosis (L4-5); Chronic low back pain (1ry area of Pain) (Bilateral) (R>L) w/o sciatica; Chronic lower extremity pain (2ry area of Pain) (Left); Chronic lumbar radicular pain (L4 & S1 Dermatome) (Left); Chronic shoulder pain (Right side); Chronic upper extremity pain (Right-sided); Cervical radiculitis (Right side); Abnormal x-ray of lumbar spine; Chronic pain syndrome (significant psychosocial component); 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; 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); Lumbar facet syndrome (Bilateral) (R>L); Chronic sacroiliac joint pain (Bilateral); Lumbar facet hypertrophy (multilevel); Lumbar spinal stenosis (L4-5); 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); Lumbar  disc herniation with 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); Lumbosacral radiculopathy at L4; Chronic shoulder pain (Left); Arthralgia of acromioclavicular joint (Right); Acromioclavicular joint DJD (Right); Osteoarthritis of shoulder (Right); Chronic hip pain (3ry area of Pain) (Bilateral) (L>R); Chronic lower extremity pain (Bilateral) (R>L); Cervicalgia (Bilateral) (R>L); Spondylosis without myelopathy or radiculopathy, lumbosacral region; Chronic knee pain (Left); Chronic left-sided lumbar radiculopathy; Abnormal MRI, lumbar spine (12/09/2021, 07/23/2019); Lumbar disc extrusion (L2-3) (Left); Chronic shoulder pain (Bilateral); Abnormal MRI, cervical spine; Trigger point with back pain; and Lumbar trigger point syndrome on their pertinent problem list. Pain Assessment: Severity of Chronic pain is reported as a 7 /10. Location: Back Lower, Right, Left/Radaites from lower back into hips bilateral. Onset: More than a month ago. Quality: Constant, Aching, Burning. Timing: Constant. Modifying factor(s): Denies. Vitals:  height is 5\' 2"  (1.575 m) and weight is 217 lb (98.4 kg). Her temporal temperature is 97 F (36.1 C) (abnormal). Her blood pressure is 118/65 and her pulse is 86. Her respiration is 18 and oxygen saturation is 91%.  BMI: Estimated body mass index is 39.69 kg/m as calculated from the following:   Height as of this encounter: 5\' 2"  (1.575 m).   Weight as of this encounter: 217 lb (98.4 kg). Last encounter: 12/19/2022. Last procedure: 01/24/2023.  Reason for encounter: post-procedure evaluation and assessment.  She has a longstanding history of problems associated with her lumbar spine.  Her pain tends to shift from the back to the legs.  At one point she had lost a significant amount of weight and this had helped her back pain, the leg pain, and even her breathing.  Unfortunately, she has regained a significant amount of this weight back and again we are seeing how this is adversely affecting her  breathing as well as her lower back.  She has multiple medical problems that made her treatment quite a challenge,  including atrial fibrillation that had to be cardioverted since she was having a lot of issues with taking blood thinners and having recurrent episodes of epistaxis.  Around December 2023 she started having recurrence of her low back pain which we confirmed through diagnostic lumbar facet blocks.  On 12/01/2022 she had a right-sided lumbar facet block followed on 12/27/2022 by the left side.  Unfortunately, right around that time she again started having issues with an L4/S1 radiculopathy which we subsequently treated on 01/24/2023.  The radiculopathy seems to have improved, however her lower lumbar pain has returned.  Prior x-rays of the lumbar spine had shown facet disease with an anterolisthesis of L5 over S1.  An MRI of the lumbar spine done on 12/09/2021 demonstrated the patient to have also developed an anterolisthesis at L3-4 that seems to be somewhat unstable, having changed from being a 2 mm anterolisthesis to now being a 4 mm.  The patient previously had a large left subarticular disc extrusion at L2-3 for which she underwent a left hemilaminectomy and since the extrusion is no longer seen.  These changes seem to have occurred over a relatively short period time suggesting that her condition has quickly changing.  Today we talked about her weight and how she needs to bring it down again.  Meanwhile, I will be bringing her for a diagnostic bilateral lumbar facet block to determine if the pain from the facet joints has returned suggesting that the radiofrequency ablation might have not appropriately lesioned the desired nerves.  The plan was shared with the patient who understood and accepted.  RTCB: 04/29/2023  Post-procedure evaluation   Procedure: Lumbar trans-foraminal epidural steroid injection (L-TFESI) #2  Laterality: Bilateral (-50)  Level: L4 nerve root(s) Imaging:  Fluoroscopy-guided         Anesthesia: Local anesthesia (1-2% Lidocaine) Anxiolysis: IV Versed 2.0 mg Sedation: Moderate Sedation Fentanyl 1 mL (50 mcg) DOS: 01/24/2023  Performed by: Oswaldo Done, MD  Purpose: Diagnostic/Therapeutic Indications: Lumbar radicular pain severe enough to impact quality of life or function. 1. Chronic lower extremity pain (2ry area of Pain) (Left)   2. Chronic lumbar radicular pain (L4 & S1 Dermatome) (Left)   3. DDD (degenerative disc disease), lumbar   4. 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 Lumbosacral radiculopathy at L4   7. Lumbar spondylosis (L4-5)   8. Spinal stenosis of lumbar region, unspecified whether neurogenic claudication present    NAS-11 Pain score:   Pre-procedure: 8 /10   Post-procedure: 5 /10      Effectiveness:  Initial hour after procedure: 50 %. Subsequent 4-6 hours post-procedure: 80 %. Analgesia past initial 6 hours: 0 % (Pt states she never reached 100% relief. Pain fully returned next day). Ongoing improvement:  Analgesic: Further questioning today reveals that the left lower extremity pain that she was experiencing going all the way down into her foot is now gone and the primary area of pain has come back again to being that of the lower back (Bilateral).  She refers that she still has a little bit of referred pain through the left buttocks and the lateral aspect of the left leg, but it does not reach the knee. Function: Minimal improvement for the lower back, but significant improvement in the leg pain. ROM: Minimal improvement or the lower back, but significant improvement in the leg pain.  Pharmacotherapy Assessment  Analgesic: Hydrocodone/APAP 5/325 one tablet every 8 hours (15  mg/day of hydrocodone) MME/day: 15 mg/day.   Monitoring: [place] PMP: PDMP reviewed during this encounter.       Pharmacotherapy: No side-effects or adverse reactions  reported. Compliance: No problems identified. Effectiveness: Clinically acceptable.  Earlyne Iba, RN  02/07/2023  1:40 PM  Sign when Signing Visit Safety precautions to be maintained throughout the outpatient stay will include: orient to surroundings, keep bed in low position, maintain call bell within reach at all times, provide assistance with transfer out of bed and ambulation.     No results found for: "CBDTHCR" No results found for: "D8THCCBX" No results found for: "D9THCCBX"  UDS:  Summary  Date Value Ref Range Status  10/26/2022 Note  Final    Comment:    ==================================================================== [employer] Select 13 (MW) ==================================================================== Specimen Alert Note: Urinary creatinine is low; ability to detect some drugs may be compromised. Interpret results with caution. (Creatinine) ==================================================================== Test                             Result       Flag       Units  Drug Present and Declared for Prescription Verification   Hydrocodone                    3780         EXPECTED   ng/mg creat   Norhydrocodone                 1400         EXPECTED   ng/mg creat    Sources of hydrocodone include scheduled prescription medications.    Norhydrocodone is an expected metabolite of hydrocodone.  Drug Absent but Declared for Prescription Verification   Butalbital                     Not Detected UNEXPECTED ==================================================================== Test                      Result    Flag   Units      Ref Range   Creatinine              10        LL     mg/dL      >=40 ==================================================================== Declared Medications:  The flagging and interpretation on this report are based on the  following declared medications.  Unexpected results may arise from  inaccuracies in the declared medications.    **Note: The testing scope of this panel includes these medications:   Butalbital (Fioricet)  Hydrocodone (Norco)   **Note: The testing scope of this panel does not include the  following reported medications:   Acetaminophen (Tylenol)  Acetaminophen (Fioricet)  Acetaminophen (Norco)  Albuterol (Ventolin HFA)  Albuterol (Duoneb)  Amiodarone  Buspirone (Buspar)  Caffeine (Fioricet)  Cholecalciferol  Dapagliflozin (Farxiga)  Diclofenac (Voltaren)  Diltiazem (Cardizem)  Fluticasone (Trelegy)  Helium  Ipratropium (Duoneb)  Iron  Metolazone (Zaroxolyn)  Naloxone (Narcan)  Oxygen  Pantoprazole (Protonix)  Potassium (Klor-Con)  Rivaroxaban (Xarelto)  Roflumilast (Daliresp)  Sertraline (Zoloft)  Simvastatin (Zocor)  Spironolactone (Aldactone)  Torsemide (Demadex)  Umeclidinium (Trelegy)  Varenicline (Chantix)  Vilanterol (Trelegy) ==================================================================== For clinical consultation, please call [phone]. ====================================================================       ROS  Constitutional: Denies any fever or chills Gastrointestinal: No reported hemesis, hematochezia, vomiting, or acute GI distress Musculoskeletal: Denies any acute onset joint swelling, redness,  loss of ROM, or weakness Neurological: No reported episodes of acute onset apraxia, aphasia, dysarthria, agnosia, amnesia, paralysis, loss of coordination, or loss of consciousness  Medication Review  Cholecalciferol, Fluticasone-Umeclidin-Vilant, Furosemide, HYDROcodone-acetaminophen, acetaminophen, albuterol, amiodarone, busPIRone, butalbital-acetaminophen-caffeine, dapagliflozin propanediol, diclofenac Sodium, diltiazem, ferrous sulfate, ipratropium-albuterol, melatonin, naloxone, omeprazole, polyethylene glycol, potassium chloride SA, pregabalin, roflumilast, sertraline, simvastatin, spironolactone, torsemide, and varenicline  History Review  Allergy:  Ms. Probert is allergic to gabapentin, codeine, and meloxicam. Drug: Ms. Lynne  reports current drug use. Alcohol:  reports no history of alcohol use. Tobacco:  reports that she has been smoking cigarettes. She has a 66.00 pack-year smoking history. She has never used smokeless tobacco. Social: Ms. Gardenhire  reports that she has been smoking cigarettes. She has a 66.00 pack-year smoking history. She has never used smokeless tobacco. She reports current drug use. She reports that she does not drink alcohol. Medical:  has a past medical history of Acute postoperative pain (10/03/2017), Anxiety, BiPAP (biphasic positive airway pressure) dependence, Carpal tunnel syndrome, CHF (congestive heart failure), CHF (congestive heart failure), Chronic generalized abdominal pain, Chronic rhinitis, COPD (chronic obstructive pulmonary disease), DDD (degenerative disc disease), cervical, DDD (degenerative disc disease), lumbosacral, Depression, Dyspnea, Edema, Flu, Gastritis, GERD (gastroesophageal reflux disease), Hematuria, Hernia of abdominal wall (07/17/2018), Hernia, abdominal, Hypertension, Kidney stones, Low back pain, Lumbar radiculitis, Malodorous urine, Muscle weakness, Obesity, Oxygen dependent, Pneumonia due to COVID-19 virus (02/03/2020), Renal cyst, Sensory urge incontinence, Thyroid activity decreased, Tobacco abuse, and Wheezing. Surgical: Ms. Arambula  has a past surgical history that includes Abdominal hysterectomy; Tonsillectomy; Carpal tunnel release (Left, 2012); Tubal ligation; epigastric hernia repair (N/A, 08/13/2018); Umbilical hernia repair (N/A, 08/13/2018); RIGHT/LEFT HEART CATH AND CORONARY ANGIOGRAPHY (N/A, 07/11/2019); TEE without cardioversion (N/A, 04/29/2022); Cardioversion (N/A, 04/29/2022); Esophagogastroduodenoscopy (N/A, 06/21/2022); and cataract surgery (Bilateral). Family: family history includes Alcohol abuse in her father; Breast cancer in her sister; Cancer in her brother, brother,  and sister; Coronary artery disease in her mother; Heart disease in her father and mother; Kidney cancer in her brother; Lung cancer in her sister; Pneumonia in her brother; Stroke in her mother.  Laboratory Chemistry Profile   Renal Lab Results  Component Value Date   BUN 12 11/08/2022   CREATININE 0.71 11/08/2022   BCR 32 (H) 04/14/2022   GFRAA >60 06/26/2020   GFRNONAA >60 11/08/2022    Hepatic Lab Results  Component Value Date   AST 23 11/05/2022   ALT 18 11/05/2022   ALBUMIN 3.5 11/05/2022   ALKPHOS 82 11/05/2022   LIPASE 57 (H) 06/19/2022   AMMONIA 40 (H) 10/11/2019    Electrolytes Lab Results  Component Value Date   NA 139 11/08/2022   K 3.9 11/08/2022   CL 102 11/08/2022   CALCIUM 8.6 (L) 11/08/2022   MG 2.8 (H) 11/08/2022   PHOS 3.2 06/21/2022    Bone Lab Results  Component Value Date   25OHVITD1 13 (L) 09/17/2015   25OHVITD2 <1.0 09/17/2015   25OHVITD3 13 09/17/2015    Inflammation (CRP: Acute Phase) (ESR: Chronic Phase) Lab Results  Component Value Date   CRP 0.9 04/02/2022   ESRSEDRATE 17 04/06/2022   LATICACIDVEN 0.9 04/01/2022         Note: Above Lab results reviewed.  Recent Imaging Review  DG PAIN CLINIC C-ARM 1-60 MIN NO REPORT Fluoro was used, but no Radiologist interpretation will be provided.  Please refer to "NOTES" tab for provider progress note. Note: Reviewed        Physical Exam  General appearance:  Well nourished, well developed, and well hydrated. In no apparent acute distress Mental status: Alert, oriented x 3 (person, place, & time)       Respiratory: No evidence of acute respiratory distress Eyes: PERLA Vitals: BP 118/65   Pulse 86   Temp (!) 97 F (36.1 C) (Temporal)   Resp 18   Ht 5\' 2"  (1.575 m)   Wt 217 lb (98.4 kg)   SpO2 91% Comment: 4L of O2  BMI 39.69 kg/m  BMI: Estimated body mass index is 39.69 kg/m as calculated from the following:   Height as of this encounter: 5\' 2"  (1.575 m).   Weight as of this  encounter: 217 lb (98.4 kg). Ideal: Ideal body weight: 50.1 kg (110 lb 7.2 oz) Adjusted ideal body weight: 69.4 kg (153 lb 1.1 oz)  Assessment   Diagnosis Status  1. Chronic lower extremity pain (2ry area of Pain) (Left)   2. Chronic lumbar radicular pain (L4 & S1 Dermatome) (Left)   3. Lumbar facet syndrome (Bilateral) (R>L)   4. Lumbosacral radiculopathy at L4   5. Chronic pain syndrome (significant psychosocial component)   6. Postop check   7. Lumbar facet hypertrophy (multilevel)   8. Spondylosis without myelopathy or radiculopathy, lumbosacral region   9. Chronic low back pain (1ry area of Pain) (Bilateral) (R>L) w/o sciatica   10. Abnormal x-ray of lumbar spine   11. Abnormal MRI, lumbar spine (12/09/2021, 07/23/2019)    Improved Resolved Worsened   Updated Problems: No problems updated.  Plan of Care  Problem-specific:  No problem-specific Assessment & Plan notes found for this encounter.  Ms. LITSY EPTING has a current medication list which includes the following long-term medication(s): albuterol, amiodarone, ferrous sulfate, hydrocodone-acetaminophen, [START ON 02/28/2023] hydrocodone-acetaminophen, [START ON 03/30/2023] hydrocodone-acetaminophen, ipratropium-albuterol, potassium chloride sa, pregabalin, and sertraline.  Pharmacotherapy (Medications Ordered): No orders of the defined types were placed in this encounter.  Orders:  Orders Placed This Encounter  Procedures   LUMBAR FACET(MEDIAL BRANCH NERVE BLOCK) MBNB    Diagnosis: Lumbar Facet Syndrome (M47.816); Lumbosacral Facet Syndrome (M47.817); Lumbar Facet Joint Pain (M54.59) Medical Necessity Statement: 1.Severe chronic axial low back pain causing functional impairment documented by ongoing pain scale assessments. 2.Pain present for longer than 3 months (Chronic) documented to have failed noninvasive conservative therapies. 3.Absence of untreated radiculopathy. 4.There is no radiological evidence of  untreated fractures, tumor, infection, or deformity.  Physical Examination Findings: Positive Kemp Maneuver: (Y)  Positive Lumbar Hyperextension-Rotation provocative test: (Y)    Standing Status:   Future    Standing Expiration Date:   05/09/2023    Scheduling Instructions:     Procedure: Lumbar facet Block     Type: Medial Branch Block     Side: Bilateral     Purpose: Diagnostic Radiologic Mapping     Level(s): L3-4, L4-5, L5-S1, and TBD by Fluoroscopic Mapping Facets (L2, L3, L4, L5, S1, and TBD Medial Branch)     Sedation: With Sedation.     Timeframe: ASAP    Order Specific Question:   Where will this procedure be performed?    Answer:   [facility]   Nursing Instructions:    Please complete this patient's postprocedure evaluation.    Scheduling Instructions:     Please complete this patient's postprocedure evaluation.   Follow-up plan:   Return for (ECT): (B) L-FCT Blk.      Interventional Therapies  Risk  Complexity Considerations:   NOTE: NO further Lumbar RFA until BMI <35.  Planned  Pending:   Therapeutic right (L4-5, L5-S1) lumbar facet RFA (L3, L4, L5, and S1 medial branch) #2 (last done on 10/03/2017) (pending clearance to stop Xarelto x 3 days)   Under consideration:   Possible left lumbar facet RFA #2 (last done on 2018)  Diagnostic bilateral cervical facet MBB #1  Palliative left L2-3 LESI #4    Completed:   Palliative right lumbar facet MBB x7 (09/20/2022) (100/100/100x5days/0)  Palliative left lumbar facet MBB x6 (11/11/2021) (100/100/0/0)  Therapeutic left L2-3 LESI x3 (09/23/2021) (80/80/80)  Diagnostic/palliative left L4 TFESI x1 (12/26/2017) (100/100/50/>50)  Diagnostic/palliative left L4-5 LESI x2 (07/30/2019) (100/100/0/0)  Diagnostic/palliative right L4 TFESI x1 (01/25/2018) (100/100/50/50)  Diagnostic/palliative right L4-5 LESI x1 (01/25/2018) (100/100/50/50)  Palliative left lumbar facet RFA x1 (06/27/2017) (0/0/100/>50)  Palliative  right lumbar facet RFA x1 (10/03/2017) (100/100/75)    Therapeutic  Palliative (PRN) options:   Therapeutic lumbar facet MBB   Therapeutic left L2 LESI  Therapeutic lumbar facet RFA     Pharmacotherapy  Nonopioids transferred 08/03/2020: Gabapentin and baclofen          Recent Visits Date Type Provider Dept  01/24/23 Procedure visit Delano Metz, MD [facility]  12/27/22 Procedure visit Delano Metz, MD [facility]  12/19/22 Office Visit Delano Metz, MD [facility]  12/01/22 Procedure visit Delano Metz, MD [facility]  Showing recent visits within past 90 days and meeting all other requirements Today's Visits Date Type Provider Dept  02/07/23 Office Visit Delano Metz, MD [facility]  Showing today's visits and meeting all other requirements Future Appointments Date Type Provider Dept  05/01/23 Appointment Delano Metz, MD [facility]  Showing future appointments within next 90 days and meeting all other requirements  I discussed the assessment and treatment plan with the patient. The patient was provided an opportunity to ask questions and all were answered. The patient agreed with the plan and demonstrated an understanding of the instructions.  Patient advised to call back or seek an in-person evaluation if the symptoms or condition worsens.  Duration of encounter: 30 minutes.  Total time on encounter, as per AMA guidelines included both the face-to-face and non-face-to-face time personally spent by the physician and/or other qualified health care professional(s) on the day of the encounter (includes time in activities that require the physician or other qualified health care professional and does not include time in activities normally performed by clinical staff). Physician's time may include the following activities when performed: Preparing to see the patient (e.g.,  pre-charting review of records, searching for previously ordered imaging, lab work, and nerve conduction tests) Review of prior analgesic pharmacotherapies. Reviewing PMP Interpreting ordered tests (e.g., lab work, imaging, nerve conduction tests) Performing post-procedure evaluations, including interpretation of diagnostic procedures Obtaining and/or reviewing separately obtained history Performing a medically appropriate examination and/or evaluation Counseling and educating the patient/family/caregiver Ordering medications, tests, or procedures Referring and communicating with other health care professionals (when not separately reported) Documenting clinical information in the electronic or other health record Independently interpreting results (not separately reported) and communicating results to the patient/ family/caregiver Care coordination (not separately reported)  Note by: Oswaldo Done, MD Date: 02/07/2023; Time: 2:24 PM

## 2023-02-07 NOTE — Patient Instructions (Signed)
______________________________________________________________________  Procedure instructions  Do not eat or drink fluids (other than water) for 6 hours before your procedure  No water for 2 hours before your procedure  Take your blood pressure medicine with a sip of water  Arrive 30 minutes before your appointment  Carefully read the "Preparing for your procedure" detailed instructions  If you have questions call us at [phone]  _____________________________________________________________________    ______________________________________________________________________  Preparing for your procedure  Appointments: If you think you may not be able to keep your appointment, call 24-48 hours in advance to cancel. We need time to make it available to others.  During your procedure appointment there will be: No Prescription Refills. No disability issues to discussed. No medication changes or discussions.  Instructions: Food intake: Avoid eating anything solid for at least 8 hours prior to your procedure. Clear liquid intake: You may take clear liquids such as water up to 2 hours prior to your procedure. (No carbonated drinks. No soda.) Transportation: Unless otherwise stated by your physician, bring a driver. Morning Medicines: Except for blood thinners, take all of your other morning medications with a sip of water. Make sure to take your heart and blood pressure medicines. If your blood pressure's lower number is above 100, the case will be rescheduled. Blood thinners: Make sure to stop your blood thinners as instructed.  If you take a blood thinner, but were not instructed to stop it, call our office [phone] and ask to talk to a nurse. Not stopping a blood thinner prior to certain procedures could lead to serious complications. Diabetics on insulin: Notify the staff so that you can be scheduled 1st case in the morning. If your diabetes requires high dose insulin,  take only  of your normal insulin dose the morning of the procedure and notify the staff that you have done so. Preventing infections: Shower with an antibacterial soap the morning of your procedure.  Build-up your immune system: Take 1000 mg of Vitamin C with every meal (3 times a day) the day prior to your procedure. Antibiotics: Inform the nursing staff if you are taking any antibiotics or if you have any conditions that may require antibiotics prior to procedures. (Example: recent joint implants)   Pregnancy: If you are pregnant make sure to notify the nursing staff. Not doing so may result in injury to the fetus, including death.  Sickness: If you have a cold, fever, or any active infections, call and cancel or reschedule your procedure. Receiving steroids while having an infection may result in complications. Arrival: You must be in the facility at least 30 minutes prior to your scheduled procedure. Tardiness: Your scheduled time is also the cutoff time. If you do not arrive at least 15 minutes prior to your procedure, you will be rescheduled.  Children: Do not bring any children with you. Make arrangements to keep them home. Dress appropriately: There is always a possibility that your clothing may get soiled. Avoid long dresses. Valuables: Do not bring any jewelry or valuables.  Reasons to call and reschedule or cancel your procedure: (Following these recommendations will minimize the risk of a serious complication.) Surgeries: Avoid having procedures within 2 weeks of any surgery. (Avoid for 2 weeks before or after any surgery). Flu Shots: Avoid having procedures within 2 weeks of a flu shots or . (Avoid for 2 weeks before or after immunizations). Barium: Avoid having a procedure within 7-10 days after having had a radiological study involving the use of   radiological contrast. (Myelograms, Barium swallow or enema study). Heart attacks: Avoid any elective procedures or surgeries for the  initial 6 months after a "Myocardial Infarction" (Heart Attack). Blood thinners: It is imperative that you stop these medications before procedures. Let us know if you if you take any blood thinner.  Infection: Avoid procedures during or within two weeks of an infection (including chest colds or gastrointestinal problems). Symptoms associated with infections include: Localized redness, fever, chills, night sweats or profuse sweating, burning sensation when voiding, cough, congestion, stuffiness, runny nose, sore throat, diarrhea, nausea, vomiting, cold or Flu symptoms, recent or current infections. It is specially important if the infection is over the area that we intend to treat. Heart and lung problems: Symptoms that may suggest an active cardiopulmonary problem include: cough, chest pain, breathing difficulties or shortness of breath, dizziness, ankle swelling, uncontrolled high or unusually low blood pressure, and/or palpitations. If you are experiencing any of these symptoms, cancel your procedure and contact your primary care physician for an evaluation.  Remember:  Regular Business hours are:  Monday to Thursday 8:00 AM to 4:00 PM  Provider's Schedule: Milinda Pointer, MD:  Procedure days: Tuesday and Thursday 7:30 AM to 4:00 PM  Gillis Santa, MD:  Procedure days: Monday and Wednesday 7:30 AM to 4:00 PM  ______________________________________________________________________    ____________________________________________________________________________________________  General Risks and Possible Complications  Patient Responsibilities: It is important that you read this as it is part of your informed consent. It is our duty to inform you of the risks and possible complications associated with treatments offered to you. It is your responsibility as a patient to read this and to ask questions about anything that is not clear or that you believe was not covered in this  document.  Patient's Rights: You have the right to refuse treatment. You also have the right to change your mind, even after initially having agreed to have the treatment done. However, under this last option, if you wait until the last second to change your mind, you may be charged for the materials used up to that point.  Introduction: Medicine is not an Chief Strategy Officer. Everything in Medicine, including the lack of treatment(s), carries the potential for danger, harm, or loss (which is by definition: Risk). In Medicine, a complication is a secondary problem, condition, or disease that can aggravate an already existing one. All treatments carry the risk of possible complications. The fact that a side effects or complications occurs, does not imply that the treatment was conducted incorrectly. It must be clearly understood that these can happen even when everything is done following the highest safety standards.  No treatment: You can choose not to proceed with the proposed treatment alternative. The "PRO(s)" would include: avoiding the risk of complications associated with the therapy. The "CON(s)" would include: not getting any of the treatment benefits. These benefits fall under one of three categories: diagnostic; therapeutic; and/or palliative. Diagnostic benefits include: getting information which can ultimately lead to improvement of the disease or symptom(s). Therapeutic benefits are those associated with the successful treatment of the disease. Finally, palliative benefits are those related to the decrease of the primary symptoms, without necessarily curing the condition (example: decreasing the pain from a flare-up of a chronic condition, such as incurable terminal cancer).  General Risks and Complications: These are associated to most interventional treatments. They can occur alone, or in combination. They fall under one of the following six (6) categories: no benefit or worsening of symptoms;  bleeding; infection; nerve damage; allergic reactions; and/or death. No benefits or worsening of symptoms: In Medicine there are no guarantees, only probabilities. No healthcare provider can ever guarantee that a medical treatment will work, they can only state the probability that it may. Furthermore, there is always the possibility that the condition may worsen, either directly, or indirectly, as a consequence of the treatment. Bleeding: This is more common if the patient is taking a blood thinner, either prescription or over the counter (example: Goody Powders, Fish oil, Aspirin, Garlic, etc.), or if suffering a condition associated with impaired coagulation (example: [name], cirrhosis of the liver, low platelet counts, etc.). However, even if you do not have one on these, it can still happen. If you have any of these conditions, or take one of these drugs, make sure to notify your treating physician. Infection: This is more common in patients with a compromised immune system, either due to disease (example: diabetes, cancer, human immunodeficiency virus [HIV], etc.), or due to medications or treatments (example: therapies used to treat cancer and rheumatological diseases). However, even if you do not have one on these, it can still happen. If you have any of these conditions, or take one of these drugs, make sure to notify your treating physician. Nerve Damage: This is more common when the treatment is an invasive one, but it can also happen with the use of medications, such as those used in the treatment of cancer. The damage can occur to small secondary nerves, or to large primary ones, such as those in the spinal cord and brain. This damage may be temporary or permanent and it may lead to impairments that can range from temporary numbness to permanent paralysis and/or brain death. Allergic Reactions: Any time a substance or material comes in contact with our body, there is the possibility of an  allergic reaction. These can range from a mild skin rash (contact dermatitis) to a severe systemic reaction (anaphylactic reaction), which can result in death. Death: In general, any medical intervention can result in death, most of the time due to an unforeseen complication. ____________________________________________________________________________________________    _________________________________________________________________________________________  Body mass index (BMI) Weight Management Required  URGENT: Dear Ms. Neumann your weight has been found to be adversely affecting your health. Aggressive action is immediately required. Talk to your primary care physician.  Body mass index (BMI) is a common tool for deciding whether a person has an appropriate body weight.  It measures a persons weight in relation to their height.   According to the [facility] of health (NIH): A BMI of less than 18.5 means that a person is underweight. A BMI of between 18.5 and 24.9 is ideal. A BMI of between 25 and 29.9 is overweight. A BMI over 30 indicates obesity.  Body Mass Index (BMI) Classification BMI level (kg/m2) Category Associated incidence of chronic pain  <18  Underweight   18.5-24.9 Ideal body weight   25-29.9 Overweight  20%  30-34.9 Obese (Class I)  68%  35-39.9 Severe obesity (Class II)  136%  >40 Extreme obesity (Class III)  254%   Your current Estimated body mass index is 39.69 kg/m as calculated from the following:   Height as of this encounter: 5\' 2"  (1.575 m).   Weight as of this encounter: 217 lb (98.4 kg).  Morbidly Obese Classification: You will be considered to be "Morbidly Obese" if your BMI is above 30 and you have one or more of the following conditions caused or associated to  obesity: 1.    Type 2 Diabetes (Leading to cardiovascular diseases (CVD), stroke, peripheral vascular diseases (PVD), retinopathy, nephropathy, and neuropathy) 2.    Cardiovascular  Disease (High Blood Pressure; Congestive Heart Failure; High Cholesterol; Coronary Artery Disease; Angina; Arrhythmias, Dysrhythmias, or Heart Attacks) 3.    Breathing problems (Asthma; obesity-hypoventilation syndrome; obstructive sleep apnea; chronic inflammatory airway disease; reactive airway disease; or shortness of breath) 4.    Chronic kidney disease 5.    Liver disease (nonalcoholic fatty liver disease) 6.    High blood pressure 7.    Acid reflux (gastroesophageal reflux disease; heartburn) 8.    Osteoarthritis (OA) (affecting the hip(s), the knee(s) and/or the lower back) 9.    Low back pain (Lumbar Facet Syndrome; and/or Degenerative Disc Disease) 10.  Hip pain (Osteoarthritis of hip) (For every 1 lbs of added body weight, there is a 2 lbs increase in pressure inside of each hip articulation. 1:2 mechanical relationship) 11.  Knee pain (Osteoarthritis of knee) (For every 1 lbs of added body weight, there is a 4 lbs increase in pressure inside of each knee articulation. 1:4 mechanical relationship) (patients with a BMI>30 kg/m2 were 6.8 times more likely to develop knee OA than normal-weight individuals) 12.  Cancer: Epidemiological studies have shown that obesity is a risk factor for: post-menopausal breast cancer; cancers of the endometrium, colon and kidney cancer; malignant adenomas of the oesophagus. Obese subjects have an approximately 1.5-3.5-fold increased risk of developing these cancers compared with normal-weight subjects, and it has been estimated that between 15 and 45% of these cancers can be attributed to overweight. More recent studies suggest that obesity may also increase the risk of other types of cancer, including pancreatic, hepatic and gallbladder cancer. (Ref: Obesity and cancer. Pischon T, Nthlings U, [employer] H. Proc Nutr Soc. 2008 May;67(2):128-45. doi: 10.1017/S0029665108006976.) The International Agency for Research on Cancer (IARC) has identified 13 cancers associated  with overweight and obesity: meningioma, multiple myeloma, adenocarcinoma of the esophagus, and cancers of the thyroid, postmenopausal breast cancer, gallbladder, stomach, liver, pancreas, kidney, ovaries, uterus, colon and rectal (colorectal) cancers. 55 percent of all cancers diagnosed in women and 24 percent of those diagnosed in men are associated with overweight and obesity.  Recommendation: At this point it is urgent that you take a step back and concentrate in loosing weight. Dedicate 100% of your efforts on this task. Nothing else will improve your health more than bringing your weight down and your BMI to less than 30. If you are here, you probably have chronic pain. We know that most chronic pain patients have difficulty exercising secondary to their pain. For this reason, you must rely on proper nutrition and diet in order to lose the weight. If your BMI is above 40, you should seriously consider bariatric surgery. A realistic goal is to lose 10% of your body weight over a period of 12 months.  Be honest to yourself, if over time you have unsuccessfully tried to lose weight, then it is time for you to seek professional help and to enter a medically supervised weight management program, and/or undergo bariatric surgery. Stop procrastinating.   Pain management considerations:  1.    Pharmacological Problems: Be advised that the use of opioid analgesics (oxycodone; hydrocodone; morphine; methadone; codeine; and all of their derivatives) have been associated with decreased metabolism and weight gain.  For this reason, should we see that you are unable to lose weight while taking these medications, it may become necessary for [place] to  taper down and indefinitely discontinue them.  2.    Technical Problems: The incidence of successful interventional therapies decreases as the patient's BMI increases. It is much more difficult to accomplish a safe and effective interventional therapy on a patient with a BMI  above 35. 3.    Radiation Exposure Problems: The x-rays machine, used to accomplish injection therapies, will automatically increase their x-ray output in order to capture an appropriate bone image. This means that radiation exposure increases exponentially with the patient's BMI. (The higher the BMI, the higher the radiation exposure.) Although the level of radiation used at a given time is still safe to the patient, it is not for the physician and/or assisting staff. Unfortunately, radiation exposure is accumulative. Because physicians and the staff have to do procedures and be exposed on a daily basis, this can result in health problems such as cancer and radiation burns. Radiation exposure to the staff is monitored by the radiation batches that they wear. The exposure levels are reported back to the staff on a quarterly basis. Depending on levels of exposure, physicians and staff may be obligated by law to decrease this exposure. This means that they have the right and obligation to refuse providing therapies where they may be overexposed to radiation. For this reason, physicians may decline to offer therapies such as radiofrequency ablation or implants to patients with a BMI above 40. 4.    Current Trends: Be advised that the current trend is to no longer offer certain therapies to patients with a BMI equal to, or above 35, due to increase perioperative risks, increased technical procedural difficulties, and excessive radiation exposure to healthcare personnel.  _________________________________________________________________________________________

## 2023-02-07 NOTE — Progress Notes (Signed)
Safety precautions to be maintained throughout the outpatient stay will include: orient to surroundings, keep bed in low position, maintain call bell within reach at all times, provide assistance with transfer out of bed and ambulation.  

## 2023-02-09 ENCOUNTER — Encounter: Payer: Self-pay | Admitting: Oncology

## 2023-02-11 ENCOUNTER — Other Ambulatory Visit: Payer: Self-pay | Admitting: Family

## 2023-02-14 ENCOUNTER — Other Ambulatory Visit: Payer: Medicare HMO

## 2023-02-14 DIAGNOSIS — Z1211 Encounter for screening for malignant neoplasm of colon: Secondary | ICD-10-CM | POA: Insufficient documentation

## 2023-02-14 DIAGNOSIS — K219 Gastro-esophageal reflux disease without esophagitis: Secondary | ICD-10-CM | POA: Diagnosis not present

## 2023-02-14 DIAGNOSIS — I5032 Chronic diastolic (congestive) heart failure: Secondary | ICD-10-CM | POA: Diagnosis not present

## 2023-02-14 DIAGNOSIS — Z79891 Long term (current) use of opiate analgesic: Secondary | ICD-10-CM | POA: Diagnosis not present

## 2023-02-14 DIAGNOSIS — I4891 Unspecified atrial fibrillation: Secondary | ICD-10-CM | POA: Diagnosis not present

## 2023-02-14 DIAGNOSIS — J439 Emphysema, unspecified: Secondary | ICD-10-CM | POA: Diagnosis not present

## 2023-02-14 DIAGNOSIS — I272 Pulmonary hypertension, unspecified: Secondary | ICD-10-CM | POA: Diagnosis not present

## 2023-02-14 DIAGNOSIS — K269 Duodenal ulcer, unspecified as acute or chronic, without hemorrhage or perforation: Secondary | ICD-10-CM | POA: Diagnosis not present

## 2023-02-14 DIAGNOSIS — T50905A Adverse effect of unspecified drugs, medicaments and biological substances, initial encounter: Secondary | ICD-10-CM | POA: Diagnosis not present

## 2023-02-14 DIAGNOSIS — Z720 Tobacco use: Secondary | ICD-10-CM | POA: Diagnosis not present

## 2023-02-16 ENCOUNTER — Encounter: Payer: Self-pay | Admitting: Oncology

## 2023-02-16 ENCOUNTER — Ambulatory Visit
Admission: RE | Admit: 2023-02-16 | Discharge: 2023-02-16 | Disposition: A | Discharge status: Home / Self Care | Payer: Medicare HMO | Source: Ambulatory Visit | Attending: Neurology | Admitting: Neurology

## 2023-02-16 DIAGNOSIS — M5481 Occipital neuralgia: Secondary | ICD-10-CM | POA: Diagnosis not present

## 2023-02-17 ENCOUNTER — Other Ambulatory Visit: Payer: Self-pay

## 2023-02-17 ENCOUNTER — Encounter: Payer: Self-pay | Admitting: Oncology

## 2023-02-20 DIAGNOSIS — J9610 Chronic respiratory failure, unspecified whether with hypoxia or hypercapnia: Secondary | ICD-10-CM | POA: Diagnosis not present

## 2023-02-21 ENCOUNTER — Telehealth: Payer: Self-pay

## 2023-02-21 ENCOUNTER — Ambulatory Visit: Payer: Medicare HMO | Attending: Pain Medicine | Admitting: Pain Medicine

## 2023-02-21 ENCOUNTER — Ambulatory Visit
Admission: RE | Admit: 2023-02-21 | Discharge: 2023-02-21 | Disposition: A | Discharge status: Home / Self Care | Payer: Medicare HMO | Source: Ambulatory Visit | Attending: Pain Medicine | Admitting: Pain Medicine

## 2023-02-21 VITALS — BP 128/64 | HR 87 | Temp 98.0°F | Resp 16 | Ht 62.0 in | Wt 215.0 lb

## 2023-02-21 DIAGNOSIS — M5459 Other low back pain: Secondary | ICD-10-CM | POA: Diagnosis present

## 2023-02-21 DIAGNOSIS — M47817 Spondylosis without myelopathy or radiculopathy, lumbosacral region: Secondary | ICD-10-CM | POA: Diagnosis present

## 2023-02-21 DIAGNOSIS — M47816 Spondylosis without myelopathy or radiculopathy, lumbar region: Secondary | ICD-10-CM | POA: Diagnosis not present

## 2023-02-21 DIAGNOSIS — M4316 Spondylolisthesis, lumbar region: Secondary | ICD-10-CM | POA: Diagnosis present

## 2023-02-21 DIAGNOSIS — M545 Low back pain, unspecified: Secondary | ICD-10-CM | POA: Insufficient documentation

## 2023-02-21 DIAGNOSIS — G8929 Other chronic pain: Secondary | ICD-10-CM

## 2023-02-21 DIAGNOSIS — M4310 Spondylolisthesis, site unspecified: Secondary | ICD-10-CM

## 2023-02-21 DIAGNOSIS — R937 Abnormal findings on diagnostic imaging of other parts of musculoskeletal system: Secondary | ICD-10-CM

## 2023-02-21 MED ORDER — FENTANYL CITRATE (PF) 100 MCG/2ML IJ SOLN
25.0000 ug | INTRAMUSCULAR | Status: DC | PRN
  Administered 2023-02-21: 50 ug via INTRAVENOUS

## 2023-02-21 MED ORDER — PENTAFLUOROPROP-TETRAFLUOROETH EX AERO
INHALATION_SPRAY | Freq: Once | CUTANEOUS | Status: AC
  Administered 2023-02-21: 30 via TOPICAL
  Filled 2023-02-21: qty 116

## 2023-02-21 MED ORDER — FENTANYL CITRATE (PF) 100 MCG/2ML IJ SOLN
INTRAMUSCULAR | Status: AC
  Filled 2023-02-21: qty 2

## 2023-02-21 MED ORDER — ROPIVACAINE HCL 2 MG/ML IJ SOLN
18.0000 mL | Freq: Once | INTRAMUSCULAR | Status: AC
  Administered 2023-02-21: 18 mL via PERINEURAL

## 2023-02-21 MED ORDER — LIDOCAINE HCL 2 % IJ SOLN
INTRAMUSCULAR | Status: AC
  Filled 2023-02-21: qty 20

## 2023-02-21 MED ORDER — MIDAZOLAM HCL 5 MG/5ML IJ SOLN
0.5000 mg | Freq: Once | INTRAMUSCULAR | Status: AC
  Administered 2023-02-21: 2 mg via INTRAVENOUS

## 2023-02-21 MED ORDER — ROPIVACAINE HCL 2 MG/ML IJ SOLN
INTRAMUSCULAR | Status: AC
  Filled 2023-02-21: qty 20

## 2023-02-21 MED ORDER — TRIAMCINOLONE ACETONIDE 40 MG/ML IJ SUSP
INTRAMUSCULAR | Status: AC
  Filled 2023-02-21: qty 2

## 2023-02-21 MED ORDER — LIDOCAINE HCL 2 % IJ SOLN
20.0000 mL | Freq: Once | INTRAMUSCULAR | Status: AC
  Administered 2023-02-21: 400 mg

## 2023-02-21 MED ORDER — TRIAMCINOLONE ACETONIDE 40 MG/ML IJ SUSP
80.0000 mg | Freq: Once | INTRAMUSCULAR | Status: AC
  Administered 2023-02-21: 80 mg

## 2023-02-21 MED ORDER — LACTATED RINGERS IV SOLN: Freq: Once | INTRAVENOUS | Status: AC

## 2023-02-21 MED ORDER — MIDAZOLAM HCL 5 MG/5ML IJ SOLN
INTRAMUSCULAR | Status: AC
  Filled 2023-02-21: qty 5

## 2023-02-21 NOTE — Patient Instructions (Signed)

## 2023-02-21 NOTE — Telephone Encounter (Signed)
[facility] called stating they have been unable to reach patient by phone to set up medication delivery of furoscix. They state they do have the correct phone number, and the patient did answer once, but then disconnected the call. They state voicemail have been unanswered. This nurse called the patient and left voicemail pt requesting call back and left pharmacy's phone number for pt.

## 2023-02-21 NOTE — Progress Notes (Signed)
PROVIDER NOTE: Interpretation of information contained herein should be left to medically-trained personnel. Specific patient instructions are provided elsewhere under "Patient Instructions" section of medical record. This document was created in part using STT-dictation technology, any transcriptional errors that may result from this process are unintentional.  Patient: Theresa [name] Type: Established DOB: [birth year] MRN: [id number] PCP: Patient, No Pcp Per  Service: Procedure DOS: 02/21/2023 Setting: Ambulatory Location: Ambulatory outpatient facility Delivery: Face-to-face Provider: Oswaldo Done, MD Specialty: Interventional Pain Management Specialty designation: 09 Location: Outpatient facility Ref. Prov.: Delano Metz, MD       Interventional Therapy   Procedure: Lumbar Facet, Medial Branch Block(s)          Laterality: Bilateral  Level: L3, L4, L5, and S1 Medial Branch Level(s). Injecting these levels blocks the L4-5 and L5-S1 lumbar facet joints. Imaging: Fluoroscopic guidance         Anesthesia: Local anesthesia (1-2% Lidocaine) Anxiolysis: IV Versed 2.0 mg Sedation: Moderate Sedation Fentanyl 1 mL (50 mcg) DOS: 02/21/2023 Performed by: Oswaldo Done, MD  Primary Purpose: Diagnostic/Therapeutic Indications: Low back pain severe enough to impact quality of life or function. 1. Lumbar facet syndrome (Bilateral) (R>L)   2. Spondylosis without myelopathy or radiculopathy, lumbosacral region   3. Lumbar facet joint pain   4. Grade 1 Anterolisthesis of lumbar spine (4mm) (L3/L4)   5. Grade 1 Retrolisthesis (L5/S1)   6. Lumbar facet hypertrophy (L3-4, L5-S1) (Bilateral)   7. Chronic low back pain (1ry area of Pain) (Bilateral) (R>L) w/o sciatica   8. Abnormal MRI, lumbar spine (12/09/2021, 07/23/2019)    NAS-11 Pain score:   Pre-procedure: 8 /10   Post-procedure: 0-No pain/10     Position / Prep / Materials:  Position: Prone  Prep solution: DuraPrep  (Iodine Povacrylex [0.7% available iodine] and Isopropyl Alcohol, 74% w/w) Area Prepped: Posterolateral Lumbosacral Spine (Wide prep: From the lower border of the scapula down to the end of the tailbone and from flank to flank.)  Materials:  Tray: Block Needle(s):  Type: Spinal  Gauge (G): 22  Length: 3.5-in Qty: 4      Pre-op H&P Assessment:  Theresa [name] is a 67 y.o. (year old), female patient, seen today for interventional treatment. She  has a past surgical history that includes Abdominal hysterectomy; Tonsillectomy; Carpal tunnel release (Left, 2012); Tubal ligation; epigastric hernia repair (N/A, 08/13/2018); Umbilical hernia repair (N/A, 08/13/2018); RIGHT/LEFT HEART CATH AND CORONARY ANGIOGRAPHY (N/A, 07/11/2019); TEE without cardioversion (N/A, 04/29/2022); Cardioversion (N/A, 04/29/2022); Esophagogastroduodenoscopy (N/A, 06/21/2022); and cataract surgery (Bilateral). Theresa [name] has a current medication list which includes the following prescription(s): acetaminophen, albuterol, amiodarone, buspirone, butalbital-acetaminophen-caffeine, cholecalciferol, dapagliflozin propanediol, diclofenac sodium, diltiazem, ferrous sulfate, fluticasone-umeclidin-vilant, furoscix, hydrocodone-acetaminophen, [START ON 02/28/2023] hydrocodone-acetaminophen, [START ON 03/30/2023] hydrocodone-acetaminophen, ipratropium-albuterol, melatonin, naloxone, omeprazole, polyethylene glycol, potassium chloride sa, pregabalin, roflumilast, sertraline, simvastatin, spironolactone, torsemide, and varenicline, and the following Facility-Administered Medications: fentanyl and lactated ringers. Her primarily concern today is the Back Pain  Initial Vital Signs:  Pulse/HCG Rate: 87ECG Heart Rate: 82 (NSR) Temp: 98.4 F (36.9 C) Resp: 16 BP: 133/68 SpO2: 95 % (4L)  BMI: Estimated body mass index is 39.32 kg/m as calculated from the following:   Height as of this encounter: 5\' 2"  (1.575 m).   Weight as of this encounter:  215 lb (97.5 kg).  Risk Assessment: Allergies: Reviewed. She is allergic to gabapentin, codeine, and meloxicam.  Allergy Precautions: None required Coagulopathies: Reviewed. None identified.  Blood-thinner therapy: None at this time Active Infection(s): Reviewed. None identified.  Theresa [name] is afebrile  Site Confirmation: Theresa [name] was asked to confirm the procedure and laterality before marking the site Procedure checklist: Completed Consent: Before the procedure and under the influence of no sedative(s), amnesic(s), or anxiolytics, the patient was informed of the treatment options, risks and possible complications. To fulfill our ethical and legal obligations, as recommended by the American Medical Association's Code of Ethics, I have informed the patient of my clinical impression; the nature and purpose of the treatment or procedure; the risks, benefits, and possible complications of the intervention; the alternatives, including doing nothing; the risk(s) and benefit(s) of the alternative treatment(s) or procedure(s); and the risk(s) and benefit(s) of doing nothing. The patient was provided information about the general risks and possible complications associated with the procedure. These may include, but are not limited to: failure to achieve desired goals, infection, bleeding, organ or nerve damage, allergic reactions, paralysis, and death. In addition, the patient was informed of those risks and complications associated to Spine-related procedures, such as failure to decrease pain; infection (i.e.: Meningitis, epidural or intraspinal abscess); bleeding (i.e.: epidural hematoma, subarachnoid hemorrhage, or any other type of intraspinal or peri-dural bleeding); organ or nerve damage (i.e.: Any type of peripheral nerve, nerve root, or spinal cord injury) with subsequent damage to sensory, motor, and/or autonomic systems, resulting in permanent pain, numbness, and/or weakness of one or several  areas of the body; allergic reactions; (i.e.: anaphylactic reaction); and/or death. Furthermore, the patient was informed of those risks and complications associated with the medications. These include, but are not limited to: allergic reactions (i.e.: anaphylactic or anaphylactoid reaction(s)); adrenal axis suppression; blood sugar elevation that in diabetics may result in ketoacidosis or comma; water retention that in patients with history of congestive heart failure may result in shortness of breath, pulmonary edema, and decompensation with resultant heart failure; weight gain; swelling or edema; medication-induced neural toxicity; particulate matter embolism and blood vessel occlusion with resultant organ, and/or nervous system infarction; and/or aseptic necrosis of one or more joints. Finally, the patient was informed that Medicine is not an exact science; therefore, there is also the possibility of unforeseen or unpredictable risks and/or possible complications that may result in a catastrophic outcome. The patient indicated having understood very clearly. We have given the patient no guarantees and we have made no promises. Enough time was given to the patient to ask questions, all of which were answered to the patient's satisfaction. Ms. Goldstone has indicated that she wanted to continue with the procedure. Attestation: I, the ordering provider, attest that I have discussed with the patient the benefits, risks, side-effects, alternatives, likelihood of achieving goals, and potential problems during recovery for the procedure that I have provided informed consent. Date  Time: 02/21/2023  9:46 AM   Pre-Procedure Preparation:  Monitoring: As per clinic protocol. Respiration, ETCO2, SpO2, BP, heart rate and rhythm monitor placed and checked for adequate function Safety Precautions: Patient was assessed for positional comfort and pressure points before starting the procedure. Time-out: I initiated and  conducted the "Time-out" before starting the procedure, as per protocol. The patient was asked to participate by confirming the accuracy of the "Time Out" information. Verification of the correct person, site, and procedure were performed and confirmed by me, the nursing staff, and the patient. "Time-out" conducted as per Joint Commission's Universal Protocol (UP.01.01.01). Time: 1034 Start Time: 1034 hrs.  Description of Procedure:          Laterality: (see above) Targeted Levels: (see above)  Safety  Precautions: Aspiration looking for blood return was conducted prior to all injections. At no point did we inject any substances, as a needle was being advanced. Before injecting, the patient was told to immediately notify me if she was experiencing any new onset of "ringing in the ears, or metallic taste in the mouth". No attempts were made at seeking any paresthesias. Safe injection practices and needle disposal techniques used. Medications properly checked for expiration dates. SDV (single dose vial) medications used. After the completion of the procedure, all disposable equipment used was discarded in the proper designated medical waste containers. Local Anesthesia: Protocol guidelines were followed. The patient was positioned over the fluoroscopy table. The area was prepped in the usual manner. The time-out was completed. The target area was identified using fluoroscopy. A 12-in long, straight, sterile hemostat was used with fluoroscopic guidance to locate the targets for each level blocked. Once located, the skin was marked with an approved surgical skin marker. Once all sites were marked, the skin (epidermis, dermis, and hypodermis), as well as deeper tissues (fat, connective tissue and muscle) were infiltrated with a small amount of a short-acting local anesthetic, loaded on a 10cc syringe with a 25G, 1.5-in  Needle. An appropriate amount of time was allowed for local anesthetics to take effect before  proceeding to the next step. Local Anesthetic: Lidocaine 2.0% The unused portion of the local anesthetic was discarded in the proper designated containers. Technical description of process:  L2 Medial Branch Nerve Block (MBB): The target area for the L2 medial branch is at the junction of the postero-lateral aspect of the superior articular process and the superior, posterior, and medial edge of the transverse process of L3. Under fluoroscopic guidance, a Quincke needle was inserted until contact was made with os over the superior postero-lateral aspect of the pedicular shadow (target area). After negative aspiration for blood, 0.5 mL of the nerve block solution was injected without difficulty or complication. The needle was removed intact. L3 Medial Branch Nerve Block (MBB): The target area for the L3 medial branch is at the junction of the postero-lateral aspect of the superior articular process and the superior, posterior, and medial edge of the transverse process of L4. Under fluoroscopic guidance, a Quincke needle was inserted until contact was made with os over the superior postero-lateral aspect of the pedicular shadow (target area). After negative aspiration for blood, 0.5 mL of the nerve block solution was injected without difficulty or complication. The needle was removed intact. L4 Medial Branch Nerve Block (MBB): The target area for the L4 medial branch is at the junction of the postero-lateral aspect of the superior articular process and the superior, posterior, and medial edge of the transverse process of L5. Under fluoroscopic guidance, a Quincke needle was inserted until contact was made with os over the superior postero-lateral aspect of the pedicular shadow (target area). After negative aspiration for blood, 0.5 mL of the nerve block solution was injected without difficulty or complication. The needle was removed intact. L5 Medial Branch Nerve Block (MBB): The target area for the L5 medial  branch is at the junction of the postero-lateral aspect of the superior articular process and the superior, posterior, and medial edge of the sacral ala. Under fluoroscopic guidance, a Quincke needle was inserted until contact was made with os over the superior postero-lateral aspect of the pedicular shadow (target area). After negative aspiration for blood, 0.5 mL of the nerve block solution was injected without difficulty or complication. The needle was  removed intact. S1 Medial Branch Nerve Block (MBB): The target area for the S1 medial branch is at the posterior and inferior 6 o'clock position of the L5-S1 facet joint. Under fluoroscopic guidance, the Quincke needle inserted for the L5 MBB was redirected until contact was made with os over the inferior and postero aspect of the sacrum, at the 6 o' clock position under the L5-S1 facet joint (Target area). After negative aspiration for blood, 0.5 mL of the nerve block solution was injected without difficulty or complication. The needle was removed intact.  Once the entire procedure was completed, the treated area was cleaned, making sure to leave some of the prepping solution back to take advantage of its long term bactericidal properties.         Illustration of the posterior view of the lumbar spine and the posterior neural structures. Laminae of L2 through S1 are labeled. DPRL5, dorsal primary ramus of L5; DPRS1, dorsal primary ramus of S1; DPR3, dorsal primary ramus of L3; FJ, facet (zygapophyseal) joint L3-L4; I, inferior articular process of L4; LB1, lateral branch of dorsal primary ramus of L1; IAB, inferior articular branches from L3 medial branch (supplies L4-L5 facet joint); IBP, intermediate branch plexus; MB3, medial branch of dorsal primary ramus of L3; NR3, third lumbar nerve root; S, superior articular process of L5; SAB, superior articular branches from L4 (supplies L4-5 facet joint also); TP3, transverse process of L3.   Facet Joint  Innervation (* possible contribution)  L1-2 T12, L1 (L2*)  Medial Branch  L2-3 L1, L2 (L3*)         "          "  L3-4 L2, L3 (L4*)         "          "  L4-5 L3, L4 (L5*)         "          "  L5-S1 L4, L5, S1          "          "    Vitals:   02/21/23 1033 02/21/23 1038 02/21/23 1043 02/21/23 1052  BP: (!) 146/76 129/78 136/64   Pulse:      Resp: 17 15 15 16   Temp:    98.3 F (36.8 C)  TempSrc:      SpO2: 100% 98% 99% 93%  Weight:      Height:         End Time: 1043 hrs.  Imaging Guidance (Spinal):          Type of Imaging Technique: Fluoroscopy Guidance (Spinal) Indication(s): Assistance in needle guidance and placement for procedures requiring needle placement in or near specific anatomical locations not easily accessible without such assistance. Exposure Time: Please see nurses notes. Contrast: None used. Fluoroscopic Guidance: I was personally present during the use of fluoroscopy. "Tunnel Vision Technique" used to obtain the best possible view of the target area. Parallax error corrected before commencing the procedure. "Direction-depth-direction" technique used to introduce the needle under continuous pulsed fluoroscopy. Once target was reached, antero-posterior, oblique, and lateral fluoroscopic projection used confirm needle placement in all planes. Images permanently stored in EMR. Interpretation: No contrast injected. I personally interpreted the imaging intraoperatively. Adequate needle placement confirmed in multiple planes. Permanent images saved into the patient's record.  Post-operative Assessment:  Post-procedure Vital Signs:  Pulse/HCG Rate: 8779 Temp: 98.3 F (36.8 C) Resp: 16 BP: 136/64 SpO2: 93 %  EBL: None  Complications: No immediate post-treatment complications observed by team, or reported by patient.  Note: The patient tolerated the entire procedure well. A repeat set of vitals were taken after the procedure and the patient was kept under  observation following institutional policy, for this type of procedure. Post-procedural neurological assessment was performed, showing return to baseline, prior to discharge. The patient was provided with post-procedure discharge instructions, including a section on how to identify potential problems. Should any problems arise concerning this procedure, the patient was given instructions to immediately contact us, at any time, without hesitation. In any case, we plan to contact the patient by telephone for a follow-up status report regarding this interventional procedure.  Comments:  No additional relevant information.  Plan of Care (POC)  Orders:  Orders Placed This Encounter  Procedures   LUMBAR FACET(MEDIAL BRANCH NERVE BLOCK) MBNB    Scheduling Instructions:     Procedure: Lumbar facet block (AKA.: Lumbosacral medial branch nerve block)     Side: Bilateral     Level: L3-4 and L5-S1 Facets (L2, L3, L4, L5, and S1 Medial Branch Nerves)     Sedation: Patient's choice.     Timeframe: Today    Order Specific Question:   Where will this procedure be performed?    Answer:   [facility]   [facility] C-ARM 1-60 MIN NO REPORT    Intraoperative interpretation by procedural physician at [facility].    Standing Status:   Standing    Number of Occurrences:   1    Order Specific Question:   Reason for exam:    Answer:   Assistance in needle guidance and placement for procedures requiring needle placement in or near specific anatomical locations not easily accessible without such assistance.   Informed Consent Details: Physician/Practitioner Attestation; Transcribe to consent form and obtain patient signature    Nursing Order: Transcribe to consent form and obtain patient signature. Note: Always confirm laterality of pain with Ms. Chandra Batch, before procedure.    Order Specific Question:   Physician/Practitioner attestation of informed consent for procedure/surgical case     Answer:   I, the physician/practitioner, attest that I have discussed with the patient the benefits, risks, side effects, alternatives, likelihood of achieving goals and potential problems during recovery for the procedure that I have provided informed consent.    Order Specific Question:   Procedure    Answer:   Lumbar Facet Block  under fluoroscopic guidance    Order Specific Question:   Physician/Practitioner performing the procedure    Answer:   Ronya Gilcrest A. Laban Emperor MD    Order Specific Question:   Indication/Reason    Answer:   Low Back Pain, with our without leg pain, due to Facet Joint Arthralgia (Joint Pain) Spondylosis (Arthritis of the Spine), without myelopathy or radiculopathy (Nerve Damage).   Provide equipment / supplies at bedside    Procedure tray: "Block Tray" (Disposable  single use) Skin infiltration needle: Regular 1.5-in, 25-G, (x1) Block Needle type: Spinal Amount/quantity: 4 Size: Regular (3.5-inch) Gauge: 22G    Standing Status:   Standing    Number of Occurrences:   1    Order Specific Question:   Specify    Answer:   Block Tray   Chronic Opioid Analgesic:  Hydrocodone/APAP 5/325 one tablet every 8 hours (15 mg/day of hydrocodone) MME/day: 15 mg/day.   Medications ordered for procedure: Meds ordered this encounter  Medications   lidocaine (XYLOCAINE) 2 % (with pres) injection 400 mg  pentafluoroprop-tetrafluoroeth (GEBAUERS) aerosol   lactated ringers infusion   midazolam (VERSED) 5 MG/5ML injection 0.5-2 mg    Make sure Flumazenil is available in the pyxis when using this medication. If oversedation occurs, administer 0.2 mg IV over 15 sec. If after 45 sec no response, administer 0.2 mg again over 1 min; may repeat at 1 min intervals; not to exceed 4 doses (1 mg)   fentaNYL (SUBLIMAZE) injection 25-50 mcg    Make sure Narcan is available in the pyxis when using this medication. In the event of respiratory depression (RR< 8/min): Titrate NARCAN (naloxone) in  increments of 0.1 to 0.2 mg IV at 2-3 minute intervals, until desired degree of reversal.   ropivacaine (PF) 2 mg/mL (0.2%) (NAROPIN) injection 18 mL   triamcinolone acetonide (KENALOG-40) injection 80 mg   Medications administered: We administered lidocaine, pentafluoroprop-tetrafluoroeth, lactated ringers, midazolam, fentaNYL, ropivacaine (PF) 2 mg/mL (0.2%), and triamcinolone acetonide.  See the medical record for exact dosing, route, and time of administration.  Follow-up plan:   Return in about 2 weeks (around 03/07/2023) for Proc-day (T,Th), (Face2F), (PPE).       Interventional Therapies  Risk  Complexity Considerations:   NOTE: NO further Lumbar RFA until BMI <35.   Planned  Pending:   Therapeutic right (L4-5, L5-S1) lumbar facet RFA (L3, L4, L5, and S1 medial branch) #2 (last done on 10/03/2017) (pending clearance to stop Xarelto x 3 days)   Under consideration:   Possible left lumbar facet RFA #2 (last done on 2018)  Diagnostic bilateral cervical facet MBB #1  Palliative left L2-3 LESI #4    Completed:   Palliative right lumbar facet MBB x7 (09/20/2022) (100/100/100x5days/0)  Palliative left lumbar facet MBB x6 (11/11/2021) (100/100/0/0)  Therapeutic left L2-3 LESI x3 (09/23/2021) (80/80/80)  Diagnostic/palliative left L4 TFESI x1 (12/26/2017) (100/100/50/>50)  Diagnostic/palliative left L4-5 LESI x2 (07/30/2019) (100/100/0/0)  Diagnostic/palliative right L4 TFESI x1 (01/25/2018) (100/100/50/50)  Diagnostic/palliative right L4-5 LESI x1 (01/25/2018) (100/100/50/50)  Palliative left lumbar facet RFA x1 (06/27/2017) (0/0/100/>50)  Palliative right lumbar facet RFA x1 (10/03/2017) (100/100/75)    Therapeutic  Palliative (PRN) options:   Therapeutic lumbar facet MBB   Therapeutic left L2 LESI  Therapeutic lumbar facet RFA     Pharmacotherapy  Nonopioids transferred 08/03/2020: Gabapentin and baclofen           Recent Visits Date Type Provider Dept  02/07/23  Office Visit Delano Metz, MD [facility]  01/24/23 Procedure visit Delano Metz, MD [facility]  12/27/22 Procedure visit Delano Metz, MD [facility]  12/19/22 Office Visit Delano Metz, MD [facility]  12/01/22 Procedure visit Delano Metz, MD [facility]  Showing recent visits within past 90 days and meeting all other requirements Today's Visits Date Type Provider Dept  02/21/23 Procedure visit Delano Metz, MD [facility]  Showing today's visits and meeting all other requirements Future Appointments Date Type Provider Dept  05/01/23 Appointment Delano Metz, MD [facility]  Showing future appointments within next 90 days and meeting all other requirements  Disposition: Discharge home  Discharge (Date  Time): 02/21/2023;   hrs.   Primary Care Physician: Patient, No Pcp Per Location: [facility] Note by: Oswaldo Done, MD (TTS technology used. I apologize for any typographical errors that were not detected and corrected.) Date: 02/21/2023; Time: 10:53 AM  Disclaimer:  Medicine is not an Visual merchandiser. The only guarantee in medicine is that nothing is guaranteed. It is  important to note that the decision to proceed with this intervention was based on the information collected from the patient. The Data and conclusions were drawn from the patient's questionnaire, the interview, and the physical examination. Because the information was provided in large part by the patient, it cannot be guaranteed that it has not been purposely or unconsciously manipulated. Every effort has been made to obtain as much relevant data as possible for this evaluation. It is important to note that the conclusions that lead to this procedure are derived in large part from the available data. Always take into account that the treatment will also be dependent on availability  of resources and existing treatment guidelines, considered by other Pain Management Practitioners as being common knowledge and practice, at the time of the intervention. For Medico-Legal purposes, it is also important to point out that variation in procedural techniques and pharmacological choices are the acceptable norm. The indications, contraindications, technique, and results of the above procedure should only be interpreted and judged by a Board-Certified Interventional Pain Specialist with extensive familiarity and expertise in the same exact procedure and technique.

## 2023-02-22 ENCOUNTER — Telehealth: Payer: Self-pay | Admitting: *Deleted

## 2023-02-22 NOTE — Telephone Encounter (Signed)
Attempted to call for post procedure follow-up. Message left. 

## 2023-02-23 ENCOUNTER — Other Ambulatory Visit: Payer: Self-pay | Admitting: Family

## 2023-02-28 DIAGNOSIS — L03114 Cellulitis of left upper limb: Secondary | ICD-10-CM | POA: Diagnosis not present

## 2023-02-28 DIAGNOSIS — L03115 Cellulitis of right lower limb: Secondary | ICD-10-CM | POA: Diagnosis not present

## 2023-03-08 DIAGNOSIS — J9610 Chronic respiratory failure, unspecified whether with hypoxia or hypercapnia: Secondary | ICD-10-CM | POA: Diagnosis not present

## 2023-03-08 DIAGNOSIS — J449 Chronic obstructive pulmonary disease, unspecified: Secondary | ICD-10-CM | POA: Diagnosis not present

## 2023-03-23 DIAGNOSIS — J9610 Chronic respiratory failure, unspecified whether with hypoxia or hypercapnia: Secondary | ICD-10-CM | POA: Diagnosis not present

## 2023-03-28 ENCOUNTER — Other Ambulatory Visit: Payer: Self-pay | Admitting: Family Medicine

## 2023-03-28 ENCOUNTER — Encounter: Payer: Self-pay | Admitting: Pain Medicine

## 2023-03-28 ENCOUNTER — Ambulatory Visit: Payer: Medicare HMO | Attending: Pain Medicine | Admitting: Pain Medicine

## 2023-03-28 VITALS — BP 159/80 | HR 93 | Temp 99.2°F | Resp 18

## 2023-03-28 DIAGNOSIS — Z09 Encounter for follow-up examination after completed treatment for conditions other than malignant neoplasm: Secondary | ICD-10-CM | POA: Diagnosis not present

## 2023-03-28 DIAGNOSIS — M47817 Spondylosis without myelopathy or radiculopathy, lumbosacral region: Secondary | ICD-10-CM

## 2023-03-28 DIAGNOSIS — R937 Abnormal findings on diagnostic imaging of other parts of musculoskeletal system: Secondary | ICD-10-CM

## 2023-03-28 DIAGNOSIS — M545 Low back pain, unspecified: Secondary | ICD-10-CM | POA: Insufficient documentation

## 2023-03-28 DIAGNOSIS — G8929 Other chronic pain: Secondary | ICD-10-CM | POA: Diagnosis not present

## 2023-03-28 DIAGNOSIS — F411 Generalized anxiety disorder: Secondary | ICD-10-CM

## 2023-03-28 DIAGNOSIS — M5459 Other low back pain: Secondary | ICD-10-CM

## 2023-03-28 DIAGNOSIS — M47816 Spondylosis without myelopathy or radiculopathy, lumbar region: Secondary | ICD-10-CM | POA: Diagnosis not present

## 2023-03-28 NOTE — Progress Notes (Addendum)
PROVIDER NOTE: Information contained herein reflects review and annotations entered in association with encounter. Interpretation of such information and data should be left to medically-trained personnel. Information provided to patient can be located elsewhere in the medical record under "Patient Instructions". Document created using STT-dictation technology, any transcriptional errors that may result from process are unintentional.    Patient: Theresa [name]  Service Category: E/M  Provider: Oswaldo Done, MD  DOB: June 25, 1956  DOS: 03/28/2023  Referring Provider: No ref. provider found  MRN: [id number]  Specialty: Interventional Pain Management  PCP: Patient, No Pcp Per  Type: Established Patient  Setting: Ambulatory outpatient    Location: Office  Delivery: Face-to-face     HPI  Theresa [name], a 67 y.o. year old female, is here today because of her Facet syndrome, lumbar [M47.816]. Ms. Leotta primary complain today is Back Pain (lower)  Pertinent problems: Theresa [name] has Neuritis or radiculitis due to rupture of lumbar intervertebral disc; DDD (degenerative disc disease), lumbar; Carpal tunnel syndrome; Neurogenic pain; Neuropathic pain; Musculoskeletal pain; Lumbar spondylosis (L4-5); Chronic low back pain (1ry area of Pain) (Bilateral) (R>L) w/o sciatica; Chronic lower extremity pain (2ry area of Pain) (Left); Chronic lumbar radicular pain (L4 & S1 Dermatome) (Left); Chronic shoulder pain (Right side); Chronic upper extremity pain (Right-sided); Cervical radiculitis (Right side); Abnormal x-ray of lumbar spine; Chronic pain syndrome (significant psychosocial component); 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; 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); Lumbar facet syndrome (Bilateral) (R>L); Chronic sacroiliac joint pain (Bilateral); Lumbar facet hypertrophy (L3-4, L5-S1) (Bilateral); Lumbar spinal stenosis (L4-5); 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);  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); Lumbosacral radiculopathy at L4; Chronic shoulder pain (Left); Arthralgia of acromioclavicular joint (Right); Acromioclavicular joint DJD (Right); Osteoarthritis of shoulder (Right); Chronic hip pain (3ry area of Pain) (Bilateral) (L>R); Chronic lower extremity pain (Bilateral) (R>L); Cervicalgia (Bilateral) (R>L); Spondylosis without myelopathy or radiculopathy, lumbosacral region; Chronic knee pain (Left); Chronic left-sided lumbar radiculopathy; Abnormal MRI, lumbar spine (12/09/2021); 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 Pain Assessment: Severity of Chronic pain is reported as a 6 /10. Location: Back Lower/both buttocks into backs of both legs, worse on left side. Onset: More than a month ago. Quality: Dull, Stabbing. Timing: Constant. Modifying factor(s): Ibuprofen, Tylenol, "pain meds", hemp cream. Vitals:  temporal temperature is 99.2 F (37.3 C). Her blood pressure is 159/80 (abnormal) and her pulse is 93. Her respiration is 18 and oxygen saturation is 95%.  BMI: Estimated body mass index is 39.32 kg/m as calculated from the following:   Height as of 02/21/23: 5\' 2"  (1.575 m).   Weight as of 02/21/23: 215 lb (97.5 kg). Last encounter: 02/07/2023. Last procedure: 02/21/2023.  Reason for encounter: post-procedure evaluation and assessment.  The patient indicates that the lumbar facet blocks done this time did not really seem to have provided her with the long-term benefit that she normally gets from those.  This would suggest that the radiofrequency ablation that we did in 2018 is still providing her with some benefit despite the fact that he should have worn off quite  sometime ago.  Her pain is also very much dependent on her weight.  She has gone up and down her weight and during the time that she  lost weight her pain significantly improved to the point where we did not have to do any type of treatments for quite some time.  In reviewing her last MRI and comparing it to the one done in 2020 we see that she has had changes in her spine where she developed an L5-S1 anterolisthesis and the anterolisthesis that she had at the L3-4 level progressed from 2 mm to 4 mm.  On the other hand, she had a rather large left subarticular disc extrusion at the L2-3 level in 2020 which upon repeating her MRI in 2023 had completely gone away to the point where the radiologist indicated that the patient might have had a possible interval left hemilaminectomy where the extrusion was no longer seen.  Between 2020 and 2023 we had done a series of L2-3 lumbar epidural steroid injections, the last of which was done on 09/23/2021.  At the time the patient indicated that I have provided her with 80% relief of her pain.  Today the patient indicates that the primary area of pain is out of her lower back (Bilateral) (L>R).  She refers that very rarely she will experience any pain in the lower extremity.  In fact, she describes that the only time but she will intermittently experience that pain is on the left leg and only at night.  When it occurs it normally runs through the back of the leg down to the bend of the knee.  She denies any pain, numbness or weakness below the knee.  She indicates that her worst pain is when she stands up, her back pain becomes excruciating.  Interestingly, she describes all of this to be much worse on the left side.  In reviewing my prior notes, it indicates that the epidural steroid injections done at the L2-3 level were done on the left side precisely because this was the worst of the 2 sides.  Furthermore, the MRI that she had done on 07/23/2019 indicated this large left  subarticular disc extrusion to be on the left side.  For this reason, today we have decided to go ahead and repeat that left-sided L2-3 LESI in the hopes that this will improve her pain.  If it does, he could suggest that the disc herniation/extrusion at that level may have a dynamic component to it.  This information on the plan was shared with the patient and her son, both of which were present during the appointment.  The patient was informed of the plan, she understood and agree.  (04/11/2023) Addendum: The patient's insurance company has initially denied the request for the lumbar epidural steroid injection based on the fact that she has not had any recent physical therapy.  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  In addition, they are indicating that because the patient did not get more than 50% long-term relief of the pain from the lumbar facet injections, this somehow translates to her not being a candidate for the lumbar epidural steroid injections.  Once again, they failed to understand that chronic low back pain symptoms can come from the posterior elements of the lumbar spine, such as the facet joints, but there is also a possibility that they are radicular and coming from the open her lumbar region between the L1 and L3 levels, which has always been a clinical  challenge to treat on patient is that have both problems, such as this patient.  Post-procedure evaluation   Procedure: Lumbar Facet, Medial Branch Block(s)          Laterality: Bilateral  Level: L3, L4, L5, and S1 Medial Branch Level(s). Injecting these levels blocks the L4-5 and L5-S1 lumbar facet joints. Imaging: Fluoroscopic guidance         Anesthesia: Local anesthesia (1-2% Lidocaine) Anxiolysis: IV Versed 2.0 mg Sedation: Moderate Sedation  Fentanyl 1 mL (50 mcg) DOS: 02/21/2023 Performed by: Oswaldo Done, MD  Primary Purpose: Diagnostic/Therapeutic Indications: Low back pain severe enough to impact quality of life or function. 1. Lumbar facet syndrome (Bilateral) (R>L)   2. Spondylosis without myelopathy or radiculopathy, lumbosacral region   3. Lumbar facet joint pain   4. Grade 1 Anterolisthesis of lumbar spine (4mm) (L3/L4)   5. Grade 1 Retrolisthesis (L5/S1)   6. Lumbar facet hypertrophy (L3-4, L5-S1) (Bilateral)   7. Chronic low back pain (1ry area of Pain) (Bilateral) (R>L) w/o sciatica   8. Abnormal MRI, lumbar spine (12/09/2021, 07/23/2019)    NAS-11 Pain score:   Pre-procedure: 8 /10   Post-procedure: 0-No pain/10      Effectiveness:  Initial hour after procedure: 100 %. Subsequent 4-6 hours post-procedure: 80 %. Analgesia past initial 6 hours: 0 %. Ongoing improvement:  Analgesic: No long-term benefit Function: Back to baseline ROM: Back to baseline  Pharmacotherapy Assessment  Analgesic: Hydrocodone/APAP 5/325 one tablet every 8 hours (15 mg/day of hydrocodone) MME/day: 15 mg/day.   Monitoring: [place] PMP: PDMP reviewed during this encounter.       Pharmacotherapy: No side-effects or adverse reactions reported. Compliance: No problems identified. Effectiveness: Clinically acceptable.  No notes on file  No results found for: "CBDTHCR" No results found for: "D8THCCBX" No results found for: "D9THCCBX"  UDS:  Summary  Date Value Ref Range Status  10/26/2022 Note  Final    Comment:    ==================================================================== [employer] Select 13 (MW) ==================================================================== Specimen Alert Note: Urinary creatinine is low; ability to detect some drugs may be compromised. Interpret results with caution. (Creatinine) ==================================================================== Test                              Result       Flag       Units  Drug Present and Declared for Prescription Verification   Hydrocodone                    3780         EXPECTED   ng/mg creat   Norhydrocodone                 1400         EXPECTED   ng/mg creat    Sources of hydrocodone include scheduled prescription medications.    Norhydrocodone is an expected metabolite of hydrocodone.  Drug Absent but Declared for Prescription Verification   Butalbital                     Not Detected UNEXPECTED ==================================================================== Test                      Result    Flag   Units      Ref Range   Creatinine              10  LL     mg/dL      >=16 ==================================================================== Declared Medications:  The flagging and interpretation on this report are based on the  following declared medications.  Unexpected results may arise from  inaccuracies in the declared medications.   **Note: The testing scope of this panel includes these medications:   Butalbital (Fioricet)  Hydrocodone (Norco)   **Note: The testing scope of this panel does not include the  following reported medications:   Acetaminophen (Tylenol)  Acetaminophen (Fioricet)  Acetaminophen (Norco)  Albuterol (Ventolin HFA)  Albuterol (Duoneb)  Amiodarone  Buspirone (Buspar)  Caffeine (Fioricet)  Cholecalciferol  Dapagliflozin (Farxiga)  Diclofenac (Voltaren)  Diltiazem (Cardizem)  Fluticasone (Trelegy)  Helium  Ipratropium (Duoneb)  Iron  Metolazone (Zaroxolyn)  Naloxone (Narcan)  Oxygen  Pantoprazole (Protonix)  Potassium (Klor-Con)  Rivaroxaban (Xarelto)  Roflumilast (Daliresp)  Sertraline (Zoloft)  Simvastatin (Zocor)  Spironolactone (Aldactone)  Torsemide (Demadex)  Umeclidinium (Trelegy)  Varenicline (Chantix)  Vilanterol (Trelegy) ==================================================================== For clinical consultation, please call [phone]. ====================================================================       ROS  Constitutional: Denies any fever or chills Gastrointestinal: No reported hemesis, hematochezia, vomiting, or acute GI distress Musculoskeletal: Denies any acute onset joint swelling, redness, loss of ROM, or weakness Neurological: No reported episodes of acute onset apraxia, aphasia, dysarthria, agnosia, amnesia, paralysis, loss of coordination, or loss of consciousness  Medication Review  Cholecalciferol, Fluticasone-Umeclidin-Vilant, Furosemide, HYDROcodone-acetaminophen, acetaminophen, albuterol, amiodarone, busPIRone, butalbital-acetaminophen-caffeine, dapagliflozin propanediol, diclofenac Sodium, diltiazem, ferrous sulfate, ipratropium-albuterol, melatonin, naloxone, omeprazole, polyethylene glycol, potassium chloride SA, pregabalin, roflumilast, sertraline, simvastatin, spironolactone, torsemide, and varenicline  History Review  Allergy: Theresa [name] is allergic to gabapentin, codeine, and meloxicam. Drug: Theresa [name]  reports current drug use. Alcohol:  reports no history of alcohol use. Tobacco:  reports that she has been smoking cigarettes. She has a 66.00 pack-year smoking history. She has never used smokeless tobacco. Social: Ms. Dinicola  reports that she has been smoking cigarettes. She has a 66.00 pack-year smoking history. She has never used smokeless tobacco. She reports current drug use. She reports that she does not drink alcohol. Medical:  has a past medical history of Acute postoperative pain (10/03/2017), Anxiety, BiPAP (biphasic positive airway pressure) dependence, Carpal tunnel syndrome, CHF (congestive heart failure) (HCC), CHF (congestive heart failure) (HCC), Chronic generalized abdominal pain, Chronic rhinitis, COPD (chronic obstructive pulmonary disease) (HCC), DDD (degenerative disc disease), cervical, DDD (degenerative disc disease), lumbosacral, Depression, Dyspnea, Edema, Flu,  Gastritis, GERD (gastroesophageal reflux disease), Hematuria, Hernia of abdominal wall (07/17/2018), Hernia, abdominal, Hypertension, Kidney stones, Low back pain, Lumbar radiculitis, Malodorous urine, Muscle weakness, Obesity, Oxygen dependent, Pneumonia due to COVID-19 virus (02/03/2020), Renal cyst, Sensory urge incontinence, Thyroid activity decreased, Tobacco abuse, and Wheezing. Surgical: Ms. Door  has a past surgical history that includes Abdominal hysterectomy; Tonsillectomy; Carpal tunnel release (Left, 2012); Tubal ligation; epigastric hernia repair (N/A, 08/13/2018); Umbilical hernia repair (N/A, 08/13/2018); RIGHT/LEFT HEART CATH AND CORONARY ANGIOGRAPHY (N/A, 07/11/2019); TEE without cardioversion (N/A, 04/29/2022); Cardioversion (N/A, 04/29/2022); Esophagogastroduodenoscopy (N/A, 06/21/2022); and cataract surgery (Bilateral). Family: family history includes Alcohol abuse in her father; Breast cancer in her sister; Cancer in her brother, brother, and sister; Coronary artery disease in her mother; Heart disease in her father and mother; Kidney cancer in her brother; Lung cancer in her sister; Pneumonia in her brother; Stroke in her mother.  Laboratory Chemistry Profile   Renal Lab Results  Component Value Date   BUN 12 11/08/2022   CREATININE 0.71 11/08/2022   BCR 32 (H) 04/14/2022  GFRAA >60 06/26/2020   GFRNONAA >60 11/08/2022    Hepatic Lab Results  Component Value Date   AST 23 11/05/2022   ALT 18 11/05/2022   ALBUMIN 3.5 11/05/2022   ALKPHOS 82 11/05/2022   LIPASE 57 (H) 06/19/2022   AMMONIA 40 (H) 10/11/2019    Electrolytes Lab Results  Component Value Date   NA 139 11/08/2022   K 3.9 11/08/2022   CL 102 11/08/2022   CALCIUM 8.6 (L) 11/08/2022   MG 2.8 (H) 11/08/2022   PHOS 3.2 06/21/2022    Bone Lab Results  Component Value Date   25OHVITD1 13 (L) 09/17/2015   25OHVITD2 <1.0 09/17/2015   25OHVITD3 13 09/17/2015    Inflammation (CRP: Acute Phase) (ESR:  Chronic Phase) Lab Results  Component Value Date   CRP 0.9 04/02/2022   ESRSEDRATE 17 04/06/2022   LATICACIDVEN 0.9 04/01/2022         Note: Above Lab results reviewed.  Recent Imaging Review  DG PAIN CLINIC C-ARM 1-60 MIN NO REPORT Fluoro was used, but no Radiologist interpretation will be provided.  Please refer to "NOTES" tab for provider progress note. Note: Reviewed        Physical Exam  General appearance: Well nourished, well developed, and well hydrated. In no apparent acute distress Mental status: Alert, oriented x 3 (person, place, & time)       Respiratory: No evidence of acute respiratory distress Eyes: PERLA Vitals: BP (!) 159/80   Pulse 93   Temp 99.2 F (37.3 C) (Temporal)   Resp 18   SpO2 95% Comment: O2 at 4 liters BNC BMI: Estimated body mass index is 39.32 kg/m as calculated from the following:   Height as of 02/21/23: 5\' 2"  (1.575 m).   Weight as of 02/21/23: 215 lb (97.5 kg). Ideal: Patient weight not recorded  Assessment   Diagnosis Status  1. Lumbar facet syndrome (Bilateral) (R>L)   2. Lumbar facet joint pain   3. Chronic low back pain (1ry area of Pain) (Bilateral) (R>L) w/o sciatica   4. Spondylosis without myelopathy or radiculopathy, lumbosacral region   5. Postop check   6. Abnormal MRI, lumbar spine (12/09/2021, 07/23/2019)    Controlled Controlled Controlled   Updated Problems: No problems updated.   Plan of Care  Problem-specific:  No problem-specific Assessment & Plan notes found for this encounter.  Theresa [name] has a current medication list which includes the following long-term medication(s): albuterol, amiodarone, ferrous sulfate, hydrocodone-acetaminophen, hydrocodone-acetaminophen, ipratropium-albuterol, potassium chloride sa, sertraline, spironolactone, hydrocodone-acetaminophen, and pregabalin.  Pharmacotherapy (Medications Ordered): No orders of the defined types were placed in this encounter.  Orders:  Orders  Placed This Encounter  Procedures   Lumbar Epidural Injection    Standing Status:   Future    Standing Expiration Date:   06/28/2023    Scheduling Instructions:     Procedure: Interlaminar Lumbar Epidural Steroid injection (LESI)  L2-3     Laterality: Left-sided     Sedation: With Sedation.     Timeframe: ASAP    Order Specific Question:   Where will this procedure be performed?    Answer:   [facility]   Nursing Instructions:    Please complete this patient's postprocedure evaluation.    Scheduling Instructions:     Please complete this patient's postprocedure evaluation.   Follow-up plan:   Return for (ECT): (L) L2-3 LESI #1.      Interventional Therapies  Risk  Complexity Considerations:   NOTE: NO  further Lumbar RFA until BMI <35.   Planned  Pending:   Therapeutic right (L4-5, L5-S1) lumbar facet RFA (L3, L4, L5, and S1 medial branch) #2 (last done on 10/03/2017) (pending clearance to stop Xarelto x 3 days)   Under consideration:   Possible left lumbar facet RFA #2 (last done on 2018)  Diagnostic bilateral cervical facet MBB #1  Palliative left L2-3 LESI #4    Completed:   Palliative right lumbar facet MBB x7 (09/20/2022) (100/100/100x5days/0)  Palliative left lumbar facet MBB x6 (11/11/2021) (100/100/0/0)  Therapeutic left L2-3 LESI x3 (09/23/2021) (80/80/80)  Diagnostic/palliative left L4 TFESI x1 (12/26/2017) (100/100/50/>50)  Diagnostic/palliative left L4-5 LESI x2 (07/30/2019) (100/100/0/0)  Diagnostic/palliative right L4 TFESI x1 (01/25/2018) (100/100/50/50)  Diagnostic/palliative right L4-5 LESI x1 (01/25/2018) (100/100/50/50)  Palliative left lumbar facet RFA x1 (06/27/2017) (0/0/100/>50)  Palliative right lumbar facet RFA x1 (10/03/2017) (100/100/75)    Therapeutic  Palliative (PRN) options:   Therapeutic lumbar facet MBB   Therapeutic left L2 LESI  Therapeutic lumbar facet RFA     Pharmacotherapy  Nonopioids transferred 08/03/2020: Gabapentin  and baclofen            Recent Visits Date Type Provider Dept  03/28/23 Office Visit Delano Metz, MD [facility]  02/21/23 Procedure visit Delano Metz, MD [facility]  02/07/23 Office Visit Delano Metz, MD [facility]  01/24/23 Procedure visit Delano Metz, MD [facility]  Showing recent visits within past 90 days and meeting all other requirements Future Appointments Date Type Provider Dept  05/01/23 Appointment Delano Metz, MD [facility]  Showing future appointments within next 90 days and meeting all other requirements  I discussed the assessment and treatment plan with the patient. The patient was provided an opportunity to ask questions and all were answered. The patient agreed with the plan and demonstrated an understanding of the instructions.  Patient advised to call back or seek an in-person evaluation if the symptoms or condition worsens.  Duration of encounter: 30 minutes.  Total time on encounter, as per AMA guidelines included both the face-to-face and non-face-to-face time personally spent by the physician and/or other qualified health care professional(s) on the day of the encounter (includes time in activities that require the physician or other qualified health care professional and does not include time in activities normally performed by clinical staff). Physician's time may include the following activities when performed: Preparing to see the patient (e.g., pre-charting review of records, searching for previously ordered imaging, lab work, and nerve conduction tests) Review of prior analgesic pharmacotherapies. Reviewing PMP Interpreting ordered tests (e.g., lab work, imaging, nerve conduction tests) Performing post-procedure evaluations, including interpretation of diagnostic procedures Obtaining and/or reviewing separately obtained history Performing a medically appropriate  examination and/or evaluation Counseling and educating the patient/family/caregiver Ordering medications, tests, or procedures Referring and communicating with other health care professionals (when not separately reported) Documenting clinical information in the electronic or other health record Independently interpreting results (not separately reported) and communicating results to the patient/ family/caregiver Care coordination (not separately reported)  Note by: Oswaldo Done, MD Date: 03/28/2023; Time: 11:42 AM

## 2023-03-28 NOTE — Patient Instructions (Addendum)
Epidural Steroid Injection  An epidural steroid injection is a shot of steroid medicine, also called cortisone, and a numbing medicine that is given into the epidural space. This space is between the spinal cord and the bones of the back. This shot helps relieve pain caused by an irritated or swollen nerve root. The pain relief you get from the injection depends on the cause of your condition and how long your pain lasts. You may have a period of slightly more pain after your injection, before the steroid medicine takes effect. This medicine usually starts working within 1-3 days. In some cases, you might need 7-10 days to feel the full effect. Tell your health care provider about: Any allergies you have. All medicines you are taking, including vitamins, herbs, eye drops, creams, and over-the-counter medicines. Any problems you or family members have had with anesthesia. Any bleeding problems you have. Any surgeries you have had. Any medical conditions you have. Whether you are pregnant or may be pregnant. What are the risks? Your health care provider will talk with you about risks. These may include: Headache. Bleeding. Infection. Allergic reaction to medicines or dyes. Nerve damage. Not being able to move (paralysis). This is rare. What happens before the procedure? Medicines You may be given medicines to lower anxiety. Ask your provider about: Changing or stopping your regular medicines. These include any diabetes medicines or blood thinners you take. Taking medicines such as aspirin and ibuprofen. These medicines can thin your blood. Do not take them unless your provider tells you to. Taking over-the-counter medicines, vitamins, herbs, and supplements. General instructions Follow instructions from your provider about what you may eat and drink. Ask your provider what steps will be taken to help prevent infection. If you will be going home right after the procedure, plan to have a  responsible adult: Take you home from the hospital or clinic. You will not be allowed to drive. Care for you for the time you are told. What happens during the procedure?  An IV will be inserted into one of your veins. You may be given a sedative to help you relax. You will be asked to sit or lie on your side. The injection site will be cleaned. An X-ray machine will be used to guide the needle close to the nerve that is causing pain. A needle will be put through your skin into the epidural space. This may cause you some discomfort. Contrast dye may be injected at the site to make sure that the steroid medicine will be sent to the exact place it needs to go. The steroid medicine and a numbing medicine will be injected into the epidural space for pain relief. The needle will be removed. A bandage (dressing) will be put over the injection site. The procedure may vary among providers and hospitals. What happens after the procedure? Your blood pressure, heart rate, breathing rate, and blood oxygen level will be monitored until you leave the hospital or clinic. Your IV will be removed. Your arm or leg may feel weak or numb for a few hours. This information is not intended to replace advice given to you by your health care provider. Make sure you discuss any questions you have with your health care provider. Document Revised: 05/13/2022 Document Reviewed: 05/13/2022 Elsevier Patient Education  2024 [employer]  ______________________________________________________________________  Procedure instructions  Do not eat or drink fluids (other than water) for 6 hours before your procedure  No water for 2 hours before your procedure    Take your blood pressure medicine with a sip of water  Arrive 30 minutes before your appointment  Carefully read the "Preparing for your procedure" detailed instructions  If you have questions call us at [phone]  [phone]  _____________________________________________________________________    ______________________________________________________________________  Preparing for your procedure  Appointments: If you think you may not be able to keep your appointment, call 24-48 hours in advance to cancel. We need time to make it available to others.  During your procedure appointment there will be: No Prescription Refills. No disability issues to discussed. No medication changes or discussions.  Instructions: Food intake: Avoid eating anything solid for at least 8 hours prior to your procedure. Clear liquid intake: You may take clear liquids such as water up to 2 hours prior to your procedure. (No carbonated drinks. No soda.) Transportation: Unless otherwise stated by your physician, bring a driver. Morning Medicines: Except for blood thinners, take all of your other morning medications with a sip of water. Make sure to take your heart and blood pressure medicines. If your blood pressure's lower number is above 100, the case will be rescheduled. Blood thinners: Make sure to stop your blood thinners as instructed.  If you take a blood thinner, but were not instructed to stop it, call our office [phone] and ask to talk to a nurse. Not stopping a blood thinner prior to certain procedures could lead to serious complications. Diabetics on insulin: Notify the staff so that you can be scheduled 1st case in the morning. If your diabetes requires high dose insulin, take only  of your normal insulin dose the morning of the procedure and notify the staff that you have done so. Preventing infections: Shower with an antibacterial soap the morning of your procedure.  Build-up your immune system: Take 1000 mg of Vitamin C with every meal (3 times a day) the day prior to your procedure. Antibiotics: Inform the nursing staff if you are taking any antibiotics or if you have any conditions that may require  antibiotics prior to procedures. (Example: recent joint implants)   Pregnancy: If you are pregnant make sure to notify the nursing staff. Not doing so may result in injury to the fetus, including death.  Sickness: If you have a cold, fever, or any active infections, call and cancel or reschedule your procedure. Receiving steroids while having an infection may result in complications. Arrival: You must be in the facility at least 30 minutes prior to your scheduled procedure. Tardiness: Your scheduled time is also the cutoff time. If you do not arrive at least 15 minutes prior to your procedure, you will be rescheduled.  Children: Do not bring any children with you. Make arrangements to keep them home. Dress appropriately: There is always a possibility that your clothing may get soiled. Avoid long dresses. Valuables: Do not bring any jewelry or valuables.  Reasons to call and reschedule or cancel your procedure: (Following these recommendations will minimize the risk of a serious complication.) Surgeries: Avoid having procedures within 2 weeks of any surgery. (Avoid for 2 weeks before or after any surgery). Flu Shots: Avoid having procedures within 2 weeks of a flu shots or . (Avoid for 2 weeks before or after immunizations). Barium: Avoid having a procedure within 7-10 days after having had a radiological study involving the use of radiological contrast. (Myelograms, Barium swallow or enema study). Heart attacks: Avoid any elective procedures or surgeries for the initial 6 months after a "Myocardial Infarction" (Heart Attack). Blood thinners:   It is imperative that you stop these medications before procedures. Let us know if you if you take any blood thinner.  Infection: Avoid procedures during or within two weeks of an infection (including chest colds or gastrointestinal problems). Symptoms associated with infections include: Localized redness, fever, chills, night sweats or profuse sweating, burning  sensation when voiding, cough, congestion, stuffiness, runny nose, sore throat, diarrhea, nausea, vomiting, cold or Flu symptoms, recent or current infections. It is specially important if the infection is over the area that we intend to treat. Heart and lung problems: Symptoms that may suggest an active cardiopulmonary problem include: cough, chest pain, breathing difficulties or shortness of breath, dizziness, ankle swelling, uncontrolled high or unusually low blood pressure, and/or palpitations. If you are experiencing any of these symptoms, cancel your procedure and contact your primary care physician for an evaluation.  Remember:  Regular Business hours are:  Monday to Thursday 8:00 AM to 4:00 PM  Provider's Schedule: Francisco Naveira, MD:  Procedure days: Tuesday and Thursday 7:30 AM to 4:00 PM  Bilal Lateef, MD:  Procedure days: Monday and Wednesday 7:30 AM to 4:00 PM  ______________________________________________________________________    ____________________________________________________________________________________________  General Risks and Possible Complications  Patient Responsibilities: It is important that you read this as it is part of your informed consent. It is our duty to inform you of the risks and possible complications associated with treatments offered to you. It is your responsibility as a patient to read this and to ask questions about anything that is not clear or that you believe was not covered in this document.  Patient's Rights: You have the right to refuse treatment. You also have the right to change your mind, even after initially having agreed to have the treatment done. However, under this last option, if you wait until the last second to change your mind, you may be charged for the materials used up to that point.  Introduction: Medicine is not an exact science. Everything in Medicine, including the lack of treatment(s), carries the potential for  danger, harm, or loss (which is by definition: Risk). In Medicine, a complication is a secondary problem, condition, or disease that can aggravate an already existing one. All treatments carry the risk of possible complications. The fact that a side effects or complications occurs, does not imply that the treatment was conducted incorrectly. It must be clearly understood that these can happen even when everything is done following the highest safety standards.  No treatment: You can choose not to proceed with the proposed treatment alternative. The "PRO(s)" would include: avoiding the risk of complications associated with the therapy. The "CON(s)" would include: not getting any of the treatment benefits. These benefits fall under one of three categories: diagnostic; therapeutic; and/or palliative. Diagnostic benefits include: getting information which can ultimately lead to improvement of the disease or symptom(s). Therapeutic benefits are those associated with the successful treatment of the disease. Finally, palliative benefits are those related to the decrease of the primary symptoms, without necessarily curing the condition (example: decreasing the pain from a flare-up of a chronic condition, such as incurable terminal cancer).  General Risks and Complications: These are associated to most interventional treatments. They can occur alone, or in combination. They fall under one of the following six (6) categories: no benefit or worsening of symptoms; bleeding; infection; nerve damage; allergic reactions; and/or death. No benefits or worsening of symptoms: In Medicine there are no guarantees, only probabilities. No healthcare provider can ever guarantee that a   medical treatment will work, they can only state the probability that it may. Furthermore, there is always the possibility that the condition may worsen, either directly, or indirectly, as a consequence of the treatment. Bleeding: This is more common if  the patient is taking a blood thinner, either prescription or over the counter (example: Goody Powders, Fish oil, Aspirin, Garlic, etc.), or if suffering a condition associated with impaired coagulation (example: [name], cirrhosis of the liver, low platelet counts, etc.). However, even if you do not have one on these, it can still happen. If you have any of these conditions, or take one of these drugs, make sure to notify your treating physician. Infection: This is more common in patients with a compromised immune system, either due to disease (example: diabetes, cancer, human immunodeficiency virus [HIV], etc.), or due to medications or treatments (example: therapies used to treat cancer and rheumatological diseases). However, even if you do not have one on these, it can still happen. If you have any of these conditions, or take one of these drugs, make sure to notify your treating physician. Nerve Damage: This is more common when the treatment is an invasive one, but it can also happen with the use of medications, such as those used in the treatment of cancer. The damage can occur to small secondary nerves, or to large primary ones, such as those in the spinal cord and brain. This damage may be temporary or permanent and it may lead to impairments that can range from temporary numbness to permanent paralysis and/or brain death. Allergic Reactions: Any time a substance or material comes in contact with our body, there is the possibility of an allergic reaction. These can range from a mild skin rash (contact dermatitis) to a severe systemic reaction (anaphylactic reaction), which can result in death. Death: In general, any medical intervention can result in death, most of the time due to an unforeseen complication. ____________________________________________________________________________________________    

## 2023-04-03 DIAGNOSIS — Z9981 Dependence on supplemental oxygen: Secondary | ICD-10-CM | POA: Diagnosis not present

## 2023-04-03 DIAGNOSIS — J449 Chronic obstructive pulmonary disease, unspecified: Secondary | ICD-10-CM | POA: Diagnosis not present

## 2023-04-03 DIAGNOSIS — F17218 Nicotine dependence, cigarettes, with other nicotine-induced disorders: Secondary | ICD-10-CM | POA: Diagnosis not present

## 2023-04-07 ENCOUNTER — Telehealth: Payer: Self-pay | Admitting: *Deleted

## 2023-04-07 NOTE — Progress Notes (Signed)
  Care Coordination   Note   04/07/2023 Name: Theresa [name] MRN: [id number] DOB: 09-Aug-1956  Theresa [name] is a 67 y.o. year old female who sees Patient, No Pcp Per for primary care. I reached out to Margret Chance by phone today to offer care coordination services.  [name] was given information about Care Coordination services today including:   The Care Coordination services include support from the care team which includes your Nurse Coordinator, Clinical Social Worker, or Pharmacist.  The Care Coordination team is here to help remove barriers to the health concerns and goals most important to you. Care Coordination services are voluntary, and the patient may decline or stop services at any time by request to their care team member.   Care Coordination Consent Status: Patient agreed to services and verbal consent obtained.   Follow up plan:  Telephone appointment with care coordination team member scheduled for:  04/24/2023  Encounter Outcome:  Pt. Scheduled   Burman Nieves, CCMA Care Coordination Care Guide Direct Dial: [phone]

## 2023-04-08 DIAGNOSIS — J449 Chronic obstructive pulmonary disease, unspecified: Secondary | ICD-10-CM | POA: Diagnosis not present

## 2023-04-08 DIAGNOSIS — J9610 Chronic respiratory failure, unspecified whether with hypoxia or hypercapnia: Secondary | ICD-10-CM | POA: Diagnosis not present

## 2023-04-10 DIAGNOSIS — Z961 Presence of intraocular lens: Secondary | ICD-10-CM | POA: Diagnosis not present

## 2023-04-10 DIAGNOSIS — H5213 Myopia, bilateral: Secondary | ICD-10-CM | POA: Diagnosis not present

## 2023-04-10 DIAGNOSIS — H52223 Regular astigmatism, bilateral: Secondary | ICD-10-CM | POA: Diagnosis not present

## 2023-04-10 DIAGNOSIS — H524 Presbyopia: Secondary | ICD-10-CM | POA: Diagnosis not present

## 2023-04-11 ENCOUNTER — Telehealth: Payer: Self-pay

## 2023-04-11 ENCOUNTER — Ambulatory Visit: Payer: Medicare HMO | Attending: *Deleted

## 2023-04-11 NOTE — Telephone Encounter (Signed)
Insurance Treatment Denial Note  Date order was entered:  Order entered by: Delano Metz, MD Requested treatment: lesi Reason for denial: Documentation of Physical Therapy is missing. Recommended for approval:  see note below    Insurance denied auth for lesi because she hasn't had PT,  she hasn't gotten 50 % relief for 3 months from previous procedure and her PCP needs to be notified of prolonged steroid use. If you can write up a note addressing all of this I will submit an appeal.  She cannot do PT due to her lung function capacity. She tried in January and couldn't do it because she got out of breath to bad.

## 2023-04-12 ENCOUNTER — Telehealth: Payer: Self-pay

## 2023-04-12 NOTE — Telephone Encounter (Signed)
Returned patient's voicemail requesting refill of furoscix injections.  Pt states she received 3 doses by mail in early May, but one was defective and she has used two.   Per Clarisa Kindred, pt informed we will refill her furoscix but patient needs a sooner appointment to see if treatment needs to be adjusted.  Pt appt rescheduled from 10/22 to 7/16.   Pt verbalized understanding and had no further questions or concerns at this time.

## 2023-04-19 ENCOUNTER — Encounter: Payer: Medicare HMO | Admitting: Pain Medicine

## 2023-04-22 DIAGNOSIS — J9610 Chronic respiratory failure, unspecified whether with hypoxia or hypercapnia: Secondary | ICD-10-CM | POA: Diagnosis not present

## 2023-04-25 ENCOUNTER — Ambulatory Visit: Payer: Self-pay | Admitting: *Deleted

## 2023-04-25 ENCOUNTER — Encounter: Payer: Self-pay | Admitting: *Deleted

## 2023-04-25 NOTE — Patient Outreach (Signed)
  Care Coordination   Follow Up Visit Note   04/25/2023 Name: Theresa [name] MRN: [id number] DOB: 1956/05/16  Theresa [name] is a 67 y.o. year old female who sees Dorothey Baseman, MD for primary care. I spoke with  Theresa [name] by phone today.  What matters to the patients health and wellness today?  Continue adequate health management    Goals Addressed             This Visit's Progress    COMPLETED: Care Coordination activities - No follow up needed       Interventions Today    Flowsheet Row Most Recent Value  Chronic Disease   Chronic disease during today's visit Chronic Obstructive Pulmonary Disease (COPD), Atrial Fibrillation (AFib), Hypertension (HTN)  General Interventions   General Interventions Discussed/Reviewed General Interventions Reviewed, Doctor Visits, Durable Medical Equipment (DME), Health Screening  Doctor Visits Discussed/Reviewed Doctor Visits Reviewed, PCP, Specialist, Annual Wellness Visits  [AWV 7/11, Pain 7/15, HF 7/17, Cardiology 7/29, Neurology 8/19, cancer center 9/4, pulmonology 9/17]  Health Screening --  [Was scheduled for lung screening, but was told it would cost over $100.  She will check with insurance company again and reschedule]  Durable Medical Equipment (DME) BP Cuff, Other  [scale]  PCP/Specialist Visits Compliance with follow-up visit  Education Interventions   Education Provided Provided Education  Provided Verbal Education On Nutrition, Medication, When to see the doctor, Other  [blood pressure monitoring, state usually range 120s/60s]  Pharmacy Interventions   Pharmacy Dicussed/Reviewed Pharmacy Topics Reviewed, Affording Medications              SDOH assessments and interventions completed:  Yes  SDOH Interventions Today    Flowsheet Row Most Recent Value  SDOH Interventions   Food Insecurity Interventions Intervention Not Indicated  Housing Interventions Intervention Not Indicated  Transportation Interventions  Intervention Not Indicated        Care Coordination Interventions:  Yes, provided   Follow up plan: No further intervention required.   Encounter Outcome:  Pt. Visit Completed   Kemper Durie, RN, MSN, [facility] Care Management Care Management Coordinator [phone]

## 2023-04-27 DIAGNOSIS — Z6838 Body mass index (BMI) 38.0-38.9, adult: Secondary | ICD-10-CM | POA: Diagnosis not present

## 2023-04-27 DIAGNOSIS — E785 Hyperlipidemia, unspecified: Secondary | ICD-10-CM | POA: Diagnosis not present

## 2023-04-27 DIAGNOSIS — J449 Chronic obstructive pulmonary disease, unspecified: Secondary | ICD-10-CM | POA: Diagnosis not present

## 2023-04-27 DIAGNOSIS — I509 Heart failure, unspecified: Secondary | ICD-10-CM | POA: Diagnosis not present

## 2023-04-27 DIAGNOSIS — I480 Paroxysmal atrial fibrillation: Secondary | ICD-10-CM | POA: Diagnosis not present

## 2023-04-27 DIAGNOSIS — G8929 Other chronic pain: Secondary | ICD-10-CM | POA: Diagnosis not present

## 2023-04-27 DIAGNOSIS — Z1231 Encounter for screening mammogram for malignant neoplasm of breast: Secondary | ICD-10-CM | POA: Diagnosis not present

## 2023-04-27 DIAGNOSIS — Z0000 Encounter for general adult medical examination without abnormal findings: Secondary | ICD-10-CM | POA: Diagnosis not present

## 2023-04-27 DIAGNOSIS — K219 Gastro-esophageal reflux disease without esophagitis: Secondary | ICD-10-CM | POA: Diagnosis not present

## 2023-04-27 DIAGNOSIS — Z720 Tobacco use: Secondary | ICD-10-CM | POA: Diagnosis not present

## 2023-04-28 ENCOUNTER — Other Ambulatory Visit: Payer: Self-pay | Admitting: Family Medicine

## 2023-04-28 DIAGNOSIS — Z1231 Encounter for screening mammogram for malignant neoplasm of breast: Secondary | ICD-10-CM

## 2023-04-30 NOTE — Progress Notes (Signed)
PROVIDER NOTE: Information contained herein reflects review and annotations entered in association with encounter. Interpretation of such information and data should be left to medically-trained personnel. Information provided to patient can be located elsewhere in the medical record under "Patient Instructions". Document created using STT-dictation technology, any transcriptional errors that may result from process are unintentional.    Patient: Theresa [name]  Service Category: E/M  Provider: Oswaldo Done, MD  DOB: [birth year]  DOS: 05/01/2023  Referring Provider: Malva Limes, MD  MRN: [id number]  Specialty: Interventional Pain Management  PCP: Dorothey Baseman, MD  Type: Established Patient  Setting: Ambulatory outpatient    Location: Office  Delivery: Face-to-face     HPI  Theresa [name], a 67 y.o. year old female, is here today because of her Chronic pain syndrome [G89.4]. Theresa [name] primary complain today is Back Pain (lower)  Pertinent problems: Theresa [name] has Neuritis or radiculitis due to rupture of lumbar intervertebral disc; DDD (degenerative disc disease), lumbar; Carpal tunnel syndrome; Neurogenic pain; Neuropathic pain; Musculoskeletal pain; Lumbar spondylosis (L4-5); Chronic low back pain (1ry area of Pain) (Bilateral) (R>L) w/o sciatica; Chronic lower extremity pain (2ry area of Pain) (Left); Chronic lumbar radicular pain (L4 & S1 Dermatome) (Left); Chronic shoulder pain (Right side); Chronic upper extremity pain (Right-sided); Cervical radiculitis (Right side); Abnormal x-ray of lumbar spine; Chronic pain syndrome (significant psychosocial component); 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; 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); Lumbar facet syndrome (Bilateral) (R>L); Chronic sacroiliac joint pain (Bilateral); Lumbar facet hypertrophy (L3-4, L5-S1) (Bilateral); Lumbar spinal stenosis (L4-5); 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); Lumbar  disc herniation with 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); Lumbosacral radiculopathy at L4; Chronic shoulder pain (Left); Arthralgia of acromioclavicular joint (Right); Acromioclavicular joint DJD (Right); Osteoarthritis of shoulder (Right); Chronic hip pain (3ry area of Pain) (Bilateral) (L>R); Chronic lower extremity pain (Bilateral) (R>L); Cervicalgia (Bilateral) (R>L); Spondylosis without myelopathy or radiculopathy, lumbosacral region; Chronic knee pain (Left); Chronic left-sided lumbar radiculopathy; Abnormal MRI, lumbar spine (12/09/2021); 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 Pain Assessment: Severity of Chronic pain is reported as a 8 /10. Location: Back Lower/left buttock, back of left thigh. Onset: More than a month ago. Quality: Aching, Dull. Timing: Intermittent. Modifying factor(s): nothing. Vitals:  height is 5\' 2"  (1.575 m) and weight is 207 lb (93.9 kg). Her temporal temperature is 97.6 F (36.4 C). Her blood pressure is 124/67 and her pulse is 92. Her respiration is 18 and oxygen saturation is 93%.  BMI: Estimated body mass index is 37.86 kg/m as calculated from the following:   Height as of this encounter: 5\' 2"  (1.575 m).   Weight as of this encounter: 207 lb (93.9 kg). Last encounter: 03/28/2023. Last procedure: 02/21/2023.  Reason for encounter: medication management.  The patient indicates doing well with the current medication regimen. No adverse reactions or side effects reported to the medications.  She comes into the clinic today on a wheelchair accompanied by her son.  She is still having a lot of difficulty with breathing and she is on O2.  Unfortunately, she continues to smoke and did not regain weight both  of those are working against her with regards to her ability to breathe.  Today I have reminded them that the all p.o.  which will cause respiratory depression and she needs to quit smoking and lose the weight both of which she has done in the past and helped her significantly with her breathing and her pain.  She realizes this and she refers that she needs to work on that.  She is pending to see her pneumologist to have her oxygen levels adjusted.  RTCB: 07/30/2023   Pharmacotherapy Assessment  Analgesic: Hydrocodone/APAP 5/325 one tablet every 8 hours (15 mg/day of hydrocodone) MME/day: 15 mg/day.   Monitoring: [place] PMP: PDMP reviewed during this encounter.       Pharmacotherapy: No side-effects or adverse reactions reported. Compliance: No problems identified. Effectiveness: Clinically acceptable.  Concepcion Elk, RN  05/01/2023 10:51 AM  Sign when Signing Visit Nursing Pain Medication Assessment:  Safety precautions to be maintained throughout the outpatient stay will include: orient to surroundings, keep bed in low position, maintain call bell within reach at all times, provide assistance with transfer out of bed and ambulation.  Medication Inspection Compliance: Pill count conducted under aseptic conditions, in front of the patient. Neither the pills nor the bottle was removed from the patient's sight at any time. Once count was completed pills were immediately returned to the patient in their original bottle.  Medication: Hydrocodone/APAP Pill/Patch Count:  15 of 90 pills remain Pill/Patch Appearance: Markings consistent with prescribed medication Bottle Appearance: Standard pharmacy container. Clearly labeled. Filled Date: 06 / 19 / 2024 Last Medication intake:  Today    No results found for: "CBDTHCR" No results found for: "D8THCCBX" No results found for: "D9THCCBX"  UDS:  Summary  Date Value Ref Range Status  10/26/2022 Note  Final    Comment:     ==================================================================== [employer] Select 13 (MW) ==================================================================== Specimen Alert Note: Urinary creatinine is low; ability to detect some drugs may be compromised. Interpret results with caution. (Creatinine) ==================================================================== Test                             Result       Flag       Units  Drug Present and Declared for Prescription Verification   Hydrocodone                    3780         EXPECTED   ng/mg creat   Norhydrocodone                 1400         EXPECTED   ng/mg creat    Sources of hydrocodone include scheduled prescription medications.    Norhydrocodone is an expected metabolite of hydrocodone.  Drug Absent but Declared for Prescription Verification   Butalbital                     Not Detected UNEXPECTED ==================================================================== Test                      Result    Flag   Units      Ref Range   Creatinine              10        LL     mg/dL      >=95 ==================================================================== Declared Medications:  The flagging and interpretation on this report are based on the  following declared medications.  Unexpected results may arise from  inaccuracies in the declared medications.   **  Note: The testing scope of this panel includes these medications:   Butalbital (Fioricet)  Hydrocodone (Norco)   **Note: The testing scope of this panel does not include the  following reported medications:   Acetaminophen (Tylenol)  Acetaminophen (Fioricet)  Acetaminophen (Norco)  Albuterol (Ventolin HFA)  Albuterol (Duoneb)  Amiodarone  Buspirone (Buspar)  Caffeine (Fioricet)  Cholecalciferol  Dapagliflozin (Farxiga)  Diclofenac (Voltaren)  Diltiazem (Cardizem)  Fluticasone (Trelegy)  Helium  Ipratropium (Duoneb)  Iron  Metolazone (Zaroxolyn)  Naloxone  (Narcan)  Oxygen  Pantoprazole (Protonix)  Potassium (Klor-Con)  Rivaroxaban (Xarelto)  Roflumilast (Daliresp)  Sertraline (Zoloft)  Simvastatin (Zocor)  Spironolactone (Aldactone)  Torsemide (Demadex)  Umeclidinium (Trelegy)  Varenicline (Chantix)  Vilanterol (Trelegy) ==================================================================== For clinical consultation, please call [phone]. ====================================================================       ROS  Constitutional: Denies any fever or chills Gastrointestinal: No reported hemesis, hematochezia, vomiting, or acute GI distress Musculoskeletal: Denies any acute onset joint swelling, redness, loss of ROM, or weakness Neurological: No reported episodes of acute onset apraxia, aphasia, dysarthria, agnosia, amnesia, paralysis, loss of coordination, or loss of consciousness  Medication Review  Cholecalciferol, Fluticasone-Umeclidin-Vilant, Furosemide, HYDROcodone-acetaminophen, acetaminophen, albuterol, amiodarone, busPIRone, butalbital-acetaminophen-caffeine, dapagliflozin propanediol, diclofenac Sodium, diltiazem, ferrous sulfate, ipratropium-albuterol, melatonin, naloxone, omeprazole, polyethylene glycol, potassium chloride SA, pregabalin, roflumilast, sertraline, simvastatin, spironolactone, torsemide, and varenicline  History Review  Allergy: Theresa [name] is allergic to gabapentin, codeine, and meloxicam. Drug: Theresa [name]  reports current drug use. Alcohol:  reports no history of alcohol use. Tobacco:  reports that she has been smoking cigarettes. She has a 66 pack-year smoking history. She has never used smokeless tobacco. Social: Theresa [name]  reports that she has been smoking cigarettes. She has a 66 pack-year smoking history. She has never used smokeless tobacco. She reports current drug use. She reports that she does not drink alcohol. Medical:  has a past medical history of Acute postoperative pain  (10/03/2017), Anxiety, BiPAP (biphasic positive airway pressure) dependence, Carpal tunnel syndrome, CHF (congestive heart failure) (HCC), CHF (congestive heart failure) (HCC), Chronic generalized abdominal pain, Chronic rhinitis, COPD (chronic obstructive pulmonary disease) (HCC), DDD (degenerative disc disease), cervical, DDD (degenerative disc disease), lumbosacral, Depression, Dyspnea, Edema, Flu, Gastritis, GERD (gastroesophageal reflux disease), Hematuria, Hernia of abdominal wall (07/17/2018), Hernia, abdominal, Hypertension, Kidney stones, Low back pain, Lumbar radiculitis, Malodorous urine, Muscle weakness, Obesity, Oxygen dependent, Pneumonia due to COVID-19 virus (02/03/2020), Renal cyst, Sensory urge incontinence, Thyroid activity decreased, Tobacco abuse, and Wheezing. Surgical: Theresa [name]  has a past surgical history that includes Abdominal hysterectomy; Tonsillectomy; Carpal tunnel release (Left, 2012); Tubal ligation; epigastric hernia repair (N/A, 08/13/2018); Umbilical hernia repair (N/A, 08/13/2018); RIGHT/LEFT HEART CATH AND CORONARY ANGIOGRAPHY (N/A, 07/11/2019); TEE without cardioversion (N/A, 04/29/2022); Cardioversion (N/A, 04/29/2022); Esophagogastroduodenoscopy (N/A, 06/21/2022); and cataract surgery (Bilateral). Family: family history includes Alcohol abuse in her father; Breast cancer in her sister; Cancer in her brother, brother, and sister; Coronary artery disease in her mother; Heart disease in her father and mother; Kidney cancer in her brother; Lung cancer in her sister; Pneumonia in her brother; Stroke in her mother.  Laboratory Chemistry Profile   Renal Lab Results  Component Value Date   BUN 12 11/08/2022   CREATININE 0.71 11/08/2022   BCR 32 (H) 04/14/2022   GFRAA >60 06/26/2020   GFRNONAA >60 11/08/2022    Hepatic Lab Results  Component Value Date   AST 23 11/05/2022   ALT 18 11/05/2022   ALBUMIN 3.5 11/05/2022   ALKPHOS 82 11/05/2022   LIPASE 57 (H)  06/19/2022   AMMONIA 40 (H) 10/11/2019    Electrolytes Lab Results  Component Value Date   NA 139 11/08/2022   K 3.9 11/08/2022   CL 102 11/08/2022   CALCIUM 8.6 (L) 11/08/2022   MG 2.8 (H) 11/08/2022   PHOS 3.2 06/21/2022    Bone Lab Results  Component Value Date   25OHVITD1 13 (L) 09/17/2015   25OHVITD2 <1.0 09/17/2015   25OHVITD3 13 09/17/2015    Inflammation (CRP: Acute Phase) (ESR: Chronic Phase) Lab Results  Component Value Date   CRP 0.9 04/02/2022   ESRSEDRATE 17 04/06/2022   LATICACIDVEN 0.9 04/01/2022         Note: Above Lab results reviewed.  Recent Imaging Review  DG PAIN CLINIC C-ARM 1-60 MIN NO REPORT Fluoro was used, but no Radiologist interpretation will be provided.  Please refer to "NOTES" tab for provider progress note. Note: Reviewed        Physical Exam  General appearance: Well nourished, well developed, and well hydrated. In no apparent acute distress Mental status: Alert, oriented x 3 (person, place, & time)       Respiratory: No evidence of acute respiratory distress Eyes: PERLA Vitals: BP 124/67   Pulse 92   Temp 97.6 F (36.4 C) (Temporal)   Resp 18   Ht 5\' 2"  (1.575 m)   Wt 207 lb (93.9 kg)   SpO2 93% Comment: O2 at 4 linter per [facility]  BMI 37.86 kg/m  BMI: Estimated body mass index is 37.86 kg/m as calculated from the following:   Height as of this encounter: 5\' 2"  (1.575 m).   Weight as of this encounter: 207 lb (93.9 kg). Ideal: Ideal body weight: 50.1 kg (110 lb 7.2 oz) Adjusted ideal body weight: 67.6 kg (149 lb 1.1 oz)  Assessment   Diagnosis Status  1. Chronic pain syndrome (significant psychosocial component)   2. Chronic low back pain (1ry area of Pain) (Bilateral) (L>R) w/o sciatica   3. Chronic hip pain (3ry area of Pain) (Bilateral) (L>R)   4. DDD (degenerative disc disease), lumbar   5. Lumbar facet syndrome (Bilateral) (L>R)   6. Chronic knee pain (Left)   7. Chronic lower extremity pain (Bilateral) (L>R)   8.  Pharmacologic therapy   9. Chronic use of opiate for therapeutic purpose   10. Encounter for medication management   11. Encounter for chronic pain management    Controlled Controlled Controlled   Updated Problems: No problems updated.  Plan of Care  Problem-specific:  No problem-specific Assessment & Plan notes found for this encounter.  Theresa [name] has a current medication list which includes the following long-term medication(s): albuterol, amiodarone, hydrocodone-acetaminophen, [START ON 05/31/2023] hydrocodone-acetaminophen, [START ON 06/30/2023] hydrocodone-acetaminophen, ipratropium-albuterol, potassium chloride sa, sertraline, spironolactone, ferrous sulfate, and pregabalin.  Pharmacotherapy (Medications Ordered): Meds ordered this encounter  Medications   HYDROcodone-acetaminophen (NORCO/VICODIN) 5-325 MG tablet    Sig: Take 1 tablet by mouth every 8 (eight) hours as needed for severe pain. Must last 30 days    Dispense:  90 tablet    Refill:  0    DO NOT: delete (not duplicate); no partial-fill (will deny script to complete), no refill request (F/U required). DISPENSE: 1 day early if closed on fill date. WARN: No CNS-depressants within 8 hrs of med.   HYDROcodone-acetaminophen (NORCO/VICODIN) 5-325 MG tablet    Sig: Take 1 tablet by mouth every 8 (eight) hours as needed for severe pain. Must last 30 days    Dispense:  90 tablet    Refill:  0    DO NOT: delete (not duplicate); no partial-fill (will deny script to complete), no refill request (F/U required). DISPENSE: 1 day early if closed on fill date. WARN: No CNS-depressants within 8 hrs of med.   HYDROcodone-acetaminophen (NORCO/VICODIN) 5-325 MG tablet    Sig: Take 1 tablet by mouth every 8 (eight) hours as needed for severe pain. Must last 30 days    Dispense:  90 tablet    Refill:  0    DO NOT: delete (not duplicate); no partial-fill (will deny script to complete), no refill request (F/U required). DISPENSE: 1  day early if closed on fill date. WARN: No CNS-depressants within 8 hrs of med.   Orders:  No orders of the defined types were placed in this encounter.  Follow-up plan:   Return in about 3 months (around 07/30/2023) for Eval-day (M,W), (F2F), (MM).      Interventional Therapies  Risk  Complexity Considerations:   NOTE: NO further Lumbar RFA until BMI <35.   Planned  Pending:   Therapeutic right (L4-5, L5-S1) lumbar facet RFA (L3, L4, L5, and S1 medial branch) #2 (last done on 10/03/2017) (pending clearance to stop Xarelto x 3 days)   Under consideration:   Possible left lumbar facet RFA #2 (last done on 2018)  Diagnostic bilateral cervical facet MBB #1  Palliative left L2-3 LESI #4    Completed:   Palliative right lumbar facet MBB x7 (09/20/2022) (100/100/100x5days/0)  Palliative left lumbar facet MBB x6 (11/11/2021) (100/100/0/0)  Therapeutic left L2-3 LESI x3 (09/23/2021) (80/80/80)  Diagnostic/palliative left L4 TFESI x1 (12/26/2017) (100/100/50/>50)  Diagnostic/palliative left L4-5 LESI x2 (07/30/2019) (100/100/0/0)  Diagnostic/palliative right L4 TFESI x1 (01/25/2018) (100/100/50/50)  Diagnostic/palliative right L4-5 LESI x1 (01/25/2018) (100/100/50/50)  Palliative left lumbar facet RFA x1 (06/27/2017) (0/0/100/>50)  Palliative right lumbar facet RFA x1 (10/03/2017) (100/100/75)    Therapeutic  Palliative (PRN) options:   Therapeutic lumbar facet MBB   Therapeutic left L2 LESI  Therapeutic lumbar facet RFA     Pharmacotherapy  Nonopioids transferred 08/03/2020: Gabapentin and baclofen             Recent Visits Date Type Provider Dept  03/28/23 Office Visit Delano Metz, MD [facility]  02/21/23 Procedure visit Delano Metz, MD [facility]  02/07/23 Office Visit Delano Metz, MD [facility]  Showing recent visits within past 90 days and meeting all other requirements Today's Visits Date Type Provider Dept   05/01/23 Office Visit Delano Metz, MD [facility]  Showing today's visits and meeting all other requirements Future Appointments Date Type Provider Dept  05/09/23 Appointment Delano Metz, MD [facility]  07/26/23 Appointment Delano Metz, MD [facility]  Showing future appointments within next 90 days and meeting all other requirements  I discussed the assessment and treatment plan with the patient. The patient was provided an opportunity to ask questions and all were answered. The patient agreed with the plan and demonstrated an understanding of the instructions.  Patient advised to call back or seek an in-person evaluation if the symptoms or condition worsens.  Duration of encounter: 30 minutes.  Total time on encounter, as per AMA guidelines included both the face-to-face and non-face-to-face time personally spent by the physician and/or other qualified health care professional(s) on the day of the encounter (includes time in activities that require the physician or other qualified health care professional and does not include time in activities normally performed  by clinical staff). Physician's time may include the following activities when performed: Preparing to see the patient (e.g., pre-charting review of records, searching for previously ordered imaging, lab work, and nerve conduction tests) Review of prior analgesic pharmacotherapies. Reviewing PMP Interpreting ordered tests (e.g., lab work, imaging, nerve conduction tests) Performing post-procedure evaluations, including interpretation of diagnostic procedures Obtaining and/or reviewing separately obtained history Performing a medically appropriate examination and/or evaluation Counseling and educating the patient/family/caregiver Ordering medications, tests, or procedures Referring and communicating with other health care professionals (when not separately reported) Documenting  clinical information in the electronic or other health record Independently interpreting results (not separately reported) and communicating results to the patient/ family/caregiver Care coordination (not separately reported)  Note by: Oswaldo Done, MD Date: 05/01/2023; Time: 11:09 AM

## 2023-04-30 NOTE — Patient Instructions (Signed)
____________________________________________________________________________________________  Opioid Pain Medication Update  To: All patients taking opioid pain medications. (I.e.: hydrocodone, hydromorphone, oxycodone, oxymorphone, morphine, codeine, methadone, tapentadol, tramadol, buprenorphine, fentanyl, etc.)  Re: Updated review of side effects and adverse reactions of opioid analgesics, as well as new information about long term effects of this class of medications.  Direct risks of long-term opioid therapy are not limited to opioid addiction and overdose. Potential medical risks include serious fractures, breathing problems during sleep, hyperalgesia, immunosuppression, chronic constipation, bowel obstruction, myocardial infarction, and tooth decay secondary to xerostomia.  Unpredictable adverse effects that can occur even if you take your medication correctly: Cognitive impairment, respiratory depression, and death. Most people think that if they take their medication "correctly", and "as instructed", that they will be safe. Nothing could be farther from the truth. In reality, a significant amount of recorded deaths associated with the use of opioids has occurred in individuals that had taken the medication for a long time, and were taking their medication correctly. The following are examples of how this can happen: Patient taking his/her medication for a long time, as instructed, without any side effects, is given a certain antibiotic or another unrelated medication, which in turn triggers a "Drug-to-drug interaction" leading to disorientation, cognitive impairment, impaired reflexes, respiratory depression or an untoward event leading to serious bodily harm or injury, including death.  Patient taking his/her medication for a long time, as instructed, without any side effects, develops an acute impairment of liver and/or kidney function. This will lead to a rapid inability of the body to  breakdown and eliminate their pain medication, which will result in effects similar to an "overdose", but with the same medicine and dose that they had always taken. This again may lead to disorientation, cognitive impairment, impaired reflexes, respiratory depression or an untoward event leading to serious bodily harm or injury, including death.  A similar problem will occur with patients as they grow older and their liver and kidney function begins to decrease as part of the aging process.  Background information: Historically, the original case for using long-term opioid therapy to treat chronic noncancer pain was based on safety assumptions that subsequent experience has called into question. In 1996, the American Pain Society and the American Academy of Pain Medicine issued a consensus statement supporting long-term opioid therapy. This statement acknowledged the dangers of opioid prescribing but concluded that the risk for addiction was low; respiratory depression induced by opioids was short-lived, occurred mainly in opioid-naive patients, and was antagonized by pain; tolerance was not a common problem; and efforts to control diversion should not constrain opioid prescribing. This has now proven to be wrong. Experience regarding the risks for opioid addiction, misuse, and overdose in community practice has failed to support these assumptions.  According to the Centers for Disease Control and Prevention, fatal overdoses involving opioid analgesics have increased sharply over the past decade. Currently, more than 96,700 people die from drug overdoses every year. Opioids are a factor in 7 out of every 10 overdose deaths. Deaths from drug overdose have surpassed motor vehicle accidents as the leading cause of death for individuals between the ages of 35 and 54.  Clinical data suggest that neuroendocrine dysfunction may be very common in both men and women, potentially causing hypogonadism, erectile  dysfunction, infertility, decreased libido, osteoporosis, and depression. Recent studies linked higher opioid dose to increased opioid-related mortality. Controlled observational studies reported that long-term opioid therapy may be associated with increased risk for cardiovascular events. Subsequent meta-analysis concluded   that the safety of long-term opioid therapy in elderly patients has not been proven.   Side Effects and adverse reactions: Common side effects: Drowsiness (sedation). Dizziness. Nausea and vomiting. Constipation. Physical dependence -- Dependence often manifests with withdrawal symptoms when opioids are discontinued or decreased. Tolerance -- As you take repeated doses of opioids, you require increased medication to experience the same effect of pain relief. Respiratory depression -- This can occur in healthy people, especially with higher doses. However, people with COPD, asthma or other lung conditions may be even more susceptible to fatal respiratory impairment.  Uncommon side effects: An increased sensitivity to feeling pain and extreme response to pain (hyperalgesia). Chronic use of opioids can lead to this. Delayed gastric emptying (the process by which the contents of your stomach are moved into your small intestine). Muscle rigidity. Immune system and hormonal dysfunction. Quick, involuntary muscle jerks (myoclonus). Arrhythmia. Itchy skin (pruritus). Dry mouth (xerostomia).  Long-term side effects: Chronic constipation. Sleep-disordered breathing (SDB). Increased risk of bone fractures. Hypothalamic-pituitary-adrenal dysregulation. Increased risk of overdose.  RISKS: Respiratory depression and death: Opioids increase the risk of respiratory depression and death.  Drug-to-drug interactions: Opioids are relatively contraindicated in combination with benzodiazepines, sleep inducers, and other central nervous system depressants. Other classes of medications  (i.e.: certain antibiotics and even over-the-counter medications) may also trigger or induce respiratory depression in some patients.  Medical conditions: Patients with pre-existing respiratory problems are at higher risk of respiratory failure and/or depression when in combination with opioid analgesics. Opioids are relatively contraindicated in some medical conditions such as central sleep apnea.   Fractures and Falls:  Opioids increase the risk and incidence of falls. This is of particular importance in elderly patients.  Endocrine System:  Long-term administration is associated with endocrine abnormalities (endocrinopathies). (Also known as Opioid-induced Endocrinopathy) Influences on both the hypothalamic-pituitary-adrenal axis?and the hypothalamic-pituitary-gonadal axis have been demonstrated with consequent hypogonadism and adrenal insufficiency in both sexes. Hypogonadism and decreased levels of dehydroepiandrosterone sulfate have been reported in men and women. Endocrine effects include: Amenorrhoea in women (abnormal absence of menstruation) Reduced libido in both sexes Decreased sexual function Erectile dysfunction in men Hypogonadisms (decreased testicular function with shrinkage of testicles) Infertility Depression and fatigue Loss of muscle mass Anxiety Depression Immune suppression Hyperalgesia Weight gain Anemia Osteoporosis Patients (particularly women of childbearing age) should avoid opioids. There is insufficient evidence to recommend routine monitoring of asymptomatic patients taking opioids in the long-term for hormonal deficiencies.  Immune System: Human studies have demonstrated that opioids have an immunomodulating effect. These effects are mediated via opioid receptors both on immune effector cells and in the central nervous system. Opioids have been demonstrated to have adverse effects on antimicrobial response and anti-tumour surveillance. Buprenorphine has  been demonstrated to have no impact on immune function.  Opioid Induced Hyperalgesia: Human studies have demonstrated that prolonged use of opioids can lead to a state of abnormal pain sensitivity, sometimes called opioid induced hyperalgesia (OIH). Opioid induced hyperalgesia is not usually seen in the absence of tolerance to opioid analgesia. Clinically, hyperalgesia may be diagnosed if the patient on long-term opioid therapy presents with increased pain. This might be qualitatively and anatomically distinct from pain related to disease progression or to breakthrough pain resulting from development of opioid tolerance. Pain associated with hyperalgesia tends to be more diffuse than the pre-existing pain and less defined in quality. Management of opioid induced hyperalgesia requires opioid dose reduction.  Cancer: Chronic opioid therapy has been associated with an increased risk of cancer   among noncancer patients with chronic pain. This association was more evident in chronic strong opioid users. Chronic opioid consumption causes significant pathological changes in the small intestine and colon. Epidemiological studies have found that there is a link between opium dependence and initiation of gastrointestinal cancers. Cancer is the second leading cause of death after cardiovascular disease. Chronic use of opioids can cause multiple conditions such as GERD, immunosuppression and renal damage as well as carcinogenic effects, which are associated with the incidence of cancers.   Mortality: Long-term opioid use has been associated with increased mortality among patients with chronic non-cancer pain (CNCP).  Prescription of long-acting opioids for chronic noncancer pain was associated with a significantly increased risk of all-cause mortality, including deaths from causes other than overdose.  Reference: Von Korff M, Kolodny A, Deyo RA, Chou R. Long-term opioid therapy reconsidered. Ann Intern Med. 2011  Sep 6;155(5):325-8. doi: 10.7326/[id number]. PMID: 21893626; PMCID: PMC3280085. Bedson J, Chen Y, Ashworth J, Hayward RA, Dunn KM, Jordan KP. Risk of adverse events in patients prescribed long-term opioids: A cohort study in the [place] Clinical Practice Research Datalink. Eur J Pain. 2019 May;23(5):908-922. doi: 10.1002/ejp.1357. Epub 2019 Jan 31. PMID: 30620116. Colameco S, Coren JS, Ciervo CA. Continuous opioid treatment for chronic noncancer pain: a time for moderation in prescribing. Postgrad Med. 2009 Jul;121(4):61-6. doi: 10.3810/pgm.2009.07.2032. PMID: 19641271. Chou R, Turner JA, Devine EB, Hansen RN, Sullivan SD, Blazina I, Dana T, Bougatsos C, Deyo RA. The effectiveness and risks of long-term opioid therapy for chronic pain: a systematic review for a National Institutes of Health Pathways to Prevention Workshop. Ann Intern Med. 2015 Feb 17;162(4):276-86. doi: 10.7326/M14-2559. PMID: 25581257. Warner M, Chen LH, Makuc DM. NCHS Data Brief No. 22. [place]: Centers for Disease Control and Prevention; 2009. Sep, Increase in Fatal Poisonings Involving Opioid Analgesics in the [place], 1999-2006. Song IA, Choi HR, Oh TK. Long-term opioid use and mortality in patients with chronic non-cancer pain: Ten-year follow-up study in [place] from 2010 through 2019. EClinicalMedicine. 2022 Jul 18;51:101558. doi: 10.1016/j.eclinm.2022.101558. PMID: 35875817; PMCID: PMC9304910. Huser, W., Schubert, T., Vogelmann, T. et al. All-cause mortality in patients with long-term opioid therapy compared with non-opioid analgesics for chronic non-cancer pain: a database study. BMC Med 18, 162 (2020). https://doi.org/10.1186/[email or web address] Rashidian H, Zendehdel K, Kamangar F, Malekzadeh R, Haghdoost AA. An Ecological Study of the Association between Opiate Use and Incidence of Cancers. Addict Health. 2016 Fall;8(4):252-260. PMID: 28819556; PMCID: PMC5554805.  Our Goal: Our goal is to control your  pain with means other than the use of opioid pain medications.  Our Recommendation: Talk to your physician about coming off of these medications. We can assist you with the tapering down and stopping these medicines. Based on the new information, even if you cannot completely stop the medication, a decrease in the dose may be associated with a lesser risk. Ask for other means of controlling the pain. Decrease or eliminate those factors that significantly contribute to your pain such as smoking, obesity, and a diet heavily tilted towards "inflammatory" nutrients.  Last Updated: 04/24/2023   ____________________________________________________________________________________________     ____________________________________________________________________________________________  Transfer of Pain Medication between Pharmacies  Re: 2023 DEA Clarification on existing regulation  Published on DEA Website: June 17, 2022  Title: Revised Regulation Allows DEA-Registered Pharmacies to Transfer Electronic Prescriptions at a Patient's Request DEA Headquarters Division - Public Information Office  "Patients now have the ability to request their electronic prescription be transferred to another pharmacy without having to go back to their practitioner to initiate the   request. This revised regulation went into effect on Monday, June 13, 2022.     At a patient's request, a DEA-registered retail pharmacy can now transfer an electronic prescription for a controlled substance (schedules II-V) to another DEA-registered retail pharmacy. Prior to this change, patients would have to go through their practitioner to cancel their prescription and have it re-issued to a different pharmacy. The process was taxing and time consuming for both patients and practitioners.    The Drug Enforcement Administration (DEA) published its intent to revise the process for transferring electronic prescriptions on September 04, 2020.  The final rule was published in the federal register on May 12, 2022 and went into effect 30 days later.  Under the final rule, a prescription can only be transferred once between pharmacies, and only if allowed under existing state or other applicable law. The prescription must remain in its electronic form; may not be altered in any way; and the transfer must be communicated directly between two licensed pharmacists. It's important to note, any authorized refills transfer with the original prescription, which means the entire prescription will be filled at the same pharmacy."    REFERENCES: 1. DEA website announcement https://www.dea.gov/stories/2023/2023-06/2022-09-01/revised-regulation-allows-dea-registered-pharmacies-transfer  2. Department of Justice website  https://www.govinfo.gov/content/pkg/FR-2022-05-12/pdf/2023-15847.pdf  3. DEPARTMENT OF JUSTICE Drug Enforcement Administration 21 CFR Part 1306 [Docket No. DEA-637] RIN [id number] "Transfer of Electronic Prescriptions for Schedules II-V Controlled Substances Between Pharmacies for Initial Filling"  ____________________________________________________________________________________________     _______________________________________________________________________  Medication Rules  Purpose: To inform patients, and their family members, of our medication rules and regulations.  Applies to: All patients receiving prescriptions from our practice (written or electronic).  Pharmacy of record: This is the pharmacy where your electronic prescriptions will be sent. Make sure we have the correct one.  Electronic prescriptions: In compliance with the [place] Strengthen Opioid Misuse Prevention (STOP) Act of 2017 (Session Law 2017-74/H243), effective October 17, 2018, all controlled substances must be electronically prescribed. Written prescriptions, faxing, or calling prescriptions to a pharmacy will no longer be  done.  Prescription refills: These will be provided only during in-person appointments. No medications will be renewed without a "face-to-face" evaluation with your provider. Applies to all prescriptions.  NOTE: The following applies primarily to controlled substances (Opioid* Pain Medications).   Type of encounter (visit): For patients receiving controlled substances, face-to-face visits are required. (Not an option and not up to the patient.)  Patient's responsibilities: Pain Pills: Bring all pain pills to every appointment (except for procedure appointments). Pill Bottles: Bring pills in original pharmacy bottle. Bring bottle, even if empty. Always bring the bottle of the most recent fill.  Medication refills: You are responsible for knowing and keeping track of what medications you are taking and when is it that you will need a refill. The day before your appointment: write a list of all prescriptions that need to be refilled. The day of the appointment: give the list to the admitting nurse. Prescriptions will be written only during appointments. No prescriptions will be written on procedure days. If you forget a medication: it will not be "Called in", "Faxed", or "electronically sent". You will need to get another appointment to get these prescribed. No early refills. Do not call asking to have your prescription filled early. Partial  or short prescriptions: Occasionally your pharmacy may not have enough pills to fill your prescription.  NEVER ACCEPT a partial fill or a prescription that is short of the total amount of pills that you were prescribed.    With controlled substances the law allows 72 hours for the pharmacy to complete the prescription.  If the prescription is not completed within 72 hours, the pharmacist will require a new prescription to be written. This means that you will be short on your medicine and we WILL NOT send another prescription to complete your original prescription.   Instead, request the pharmacy to send a carrier to a nearby branch to get enough medication to provide you with your full prescription. Prescription Accuracy: You are responsible for carefully inspecting your prescriptions before leaving our office. Have the discharge nurse carefully go over each prescription with you, before taking them home. Make sure that your name is accurately spelled, that your address is correct. Check the name and dose of your medication to make sure it is accurate. Check the number of pills, and the written instructions to make sure they are clear and accurate. Make sure that you are given enough medication to last until your next medication refill appointment. Taking Medication: Take medication as prescribed. When it comes to controlled substances, taking less pills or less frequently than prescribed is permitted and encouraged. Never take more pills than instructed. Never take the medication more frequently than prescribed.  Inform other Doctors: Always inform, all of your healthcare providers, of all the medications you take. Pain Medication from other Providers: You are not allowed to accept any additional pain medication from any other Doctor or Healthcare provider. There are two exceptions to this rule. (see below) In the event that you require additional pain medication, you are responsible for notifying us, as stated below. Cough Medicine: Often these contain an opioid, such as codeine or hydrocodone. Never accept or take cough medicine containing these opioids if you are already taking an opioid* medication. The combination may cause respiratory failure and death. Medication Agreement: You are responsible for carefully reading and following our Medication Agreement. This must be signed before receiving any prescriptions from our practice. Safely store a copy of your signed Agreement. Violations to the Agreement will result in no further prescriptions. (Additional copies of  our Medication Agreement are available upon request.) Laws, Rules, & Regulations: All patients are expected to follow all Federal and State Laws, Statutes, Rules, & Regulations. Ignorance of the Laws does not constitute a valid excuse.  Illegal drugs and Controlled Substances: The use of illegal substances (including, but not limited to marijuana and its derivatives) and/or the illegal use of any controlled substances is strictly prohibited. Violation of this rule may result in the immediate and permanent discontinuation of any and all prescriptions being written by our practice. The use of any illegal substances is prohibited. Adopted CDC guidelines & recommendations: Target dosing levels will be at or below 60 MME/day. Use of benzodiazepines** is not recommended.  Exceptions: There are only two exceptions to the rule of not receiving pain medications from other Healthcare Providers. Exception #1 (Emergencies): In the event of an emergency (i.e.: accident requiring emergency care), you are allowed to receive additional pain medication. However, you are responsible for: As soon as you are able, call our office [phone], at any time of the day or night, and leave a message stating your name, the date and nature of the emergency, and the name and dose of the medication prescribed. In the event that your call is answered by a member of our staff, make sure to document and save the date, time, and the name of the person that took your information.  Exception #2 (  Planned Surgery): In the event that you are scheduled by another doctor or dentist to have any type of surgery or procedure, you are allowed (for a period no longer than 30 days), to receive additional pain medication, for the acute post-op pain. However, in this case, you are responsible for picking up a copy of our "Post-op Pain Management for Surgeons" handout, and giving it to your surgeon or dentist. This document is available at our office,  and does not require an appointment to obtain it. Simply go to our office during business hours (Monday-Thursday from 8:00 AM to 4:00 PM) (Friday 8:00 AM to 12:00 Noon) or if you have a scheduled appointment with us, prior to your surgery, and ask for it by name. In addition, you are responsible for: calling our office [phone], at any time of the day or night, and leaving a message stating your name, name of your surgeon, type of surgery, and date of procedure or surgery. Failure to comply with your responsibilities may result in termination of therapy involving the controlled substances. Medication Agreement Violation. Following the above rules, including your responsibilities will help you in avoiding a Medication Agreement Violation ("Breaking your Pain Medication Contract").  Consequences:  Not following the above rules may result in permanent discontinuation of medication prescription therapy.  *Opioid medications include: morphine, codeine, oxycodone, oxymorphone, hydrocodone, hydromorphone, meperidine, tramadol, tapentadol, buprenorphine, fentanyl, methadone. **Benzodiazepine medications include: diazepam (Valium), alprazolam (Xanax), clonazepam (Klonopine), lorazepam (Ativan), clorazepate (Tranxene), chlordiazepoxide (Librium), estazolam (Prosom), oxazepam (Serax), temazepam (Restoril), triazolam (Halcion) (Last updated: 08/09/2022) ______________________________________________________________________    ______________________________________________________________________  Medication Recommendations and Reminders  Applies to: All patients receiving prescriptions (written and/or electronic).  Medication Rules & Regulations: You are responsible for reading, knowing, and following our "Medication Rules" document. These exist for your safety and that of others. They are not flexible and neither are we. Dismissing or ignoring them is an act of "non-compliance" that may result in  complete and irreversible termination of such medication therapy. For safety reasons, "non-compliance" will not be tolerated. As with the [place] fundamental legal principle of "ignorance of the law is no defense", we will accept no excuses for not having read and knowing the content of documents provided to you by our practice.  Pharmacy of record:  Definition: This is the pharmacy where your electronic prescriptions will be sent.  We do not endorse any particular pharmacy. It is up to you and your insurance to decide what pharmacy to use.  We do not restrict you in your choice of pharmacy. However, once we write for your prescriptions, we will NOT be re-sending more prescriptions to fix restricted supply problems created by your pharmacy, or your insurance.  The pharmacy listed in the electronic medical record should be the one where you want electronic prescriptions to be sent. If you choose to change pharmacy, simply notify our nursing staff. Changes will be made only during your regular appointments and not over the phone.  Recommendations: Keep all of your pain medications in a safe place, under lock and key, even if you live alone. We will NOT replace lost, stolen, or damaged medication. We do not accept "Police Reports" as proof of medications having been stolen. After you fill your prescription, take 1 week's worth of pills and put them away in a safe place. You should keep a separate, properly labeled bottle for this purpose. The remainder should be kept in the original bottle. Use this as your primary supply, until it runs out.   Once it's gone, then you know that you have 1 week's worth of medicine, and it is time to come in for a prescription refill. If you do this correctly, it is unlikely that you will ever run out of medicine. To make sure that the above recommendation works, it is very important that you make sure your medication refill appointments are scheduled at least 1 week before you  run out of medicine. To do this in an effective manner, make sure that you do not leave the office without scheduling your next medication management appointment. Always ask the nursing staff to show you in your prescription , when your medication will be running out. Then arrange for the receptionist to get you a return appointment, at least 7 days before you run out of medicine. Do not wait until you have 1 or 2 pills left, to come in. This is very poor planning and does not take into consideration that we may need to cancel appointments due to bad weather, sickness, or emergencies affecting our staff. DO NOT ACCEPT A "Partial Fill": If for any reason your pharmacy does not have enough pills/tablets to completely fill or refill your prescription, do not allow for a "partial fill". The law allows the pharmacy to complete that prescription within 72 hours, without requiring a new prescription. If they do not fill the rest of your prescription within those 72 hours, you will need a separate prescription to fill the remaining amount, which we will NOT provide. If the reason for the partial fill is your insurance, you will need to talk to the pharmacist about payment alternatives for the remaining tablets, but again, DO NOT ACCEPT A PARTIAL FILL, unless you can trust your pharmacist to obtain the remainder of the pills within 72 hours.  Prescription refills and/or changes in medication(s):  Prescription refills, and/or changes in dose or medication, will be conducted only during scheduled medication management appointments. (Applies to both, written and electronic prescriptions.) No refills on procedure days. No medication will be changed or started on procedure days. No changes, adjustments, and/or refills will be conducted on a procedure day. Doing so will interfere with the diagnostic portion of the procedure. No phone refills. No medications will be "called into the pharmacy". No Fax refills. No weekend  refills. No Holliday refills. No after hours refills.  Remember:  Business hours are:  Monday to Thursday 8:00 AM to 4:00 PM Provider's Schedule: Daquan Crapps, MD - Appointments are:  Medication management: Monday and Wednesday 8:00 AM to 4:00 PM Procedure day: Tuesday and Thursday 7:30 AM to 4:00 PM Bilal Lateef, MD - Appointments are:  Medication management: Tuesday and Thursday 8:00 AM to 4:00 PM Procedure day: Monday and Wednesday 7:30 AM to 4:00 PM (Last update: 08/09/2022) ______________________________________________________________________   ____________________________________________________________________________________________  Naloxone Nasal Spray  Why am I receiving this medication? [place] STOP ACT requires that all patients taking high dose opioids or at risk of opioids respiratory depression, be prescribed an opioid reversal agent, such as Naloxone (AKA: Narcan).  What is this medication? NALOXONE (nal OX one) treats opioid overdose, which causes slow or shallow breathing, severe drowsiness, or trouble staying awake. Call emergency services after using this medication. You may need additional treatment. Naloxone works by reversing the effects of opioids. It belongs to a group of medications called opioid blockers.  COMMON BRAND NAME(S): Kloxxado, Narcan  What should I tell my care team before I take this medication? They need to know if you have   any of these conditions: Heart disease Substance use disorder An unusual or allergic reaction to naloxone, other medications, foods, dyes, or preservatives Pregnant or trying to get pregnant Breast-feeding  When to use this medication? This medication is to be used for the treatment of respiratory depression (less than 8 breaths per minute) secondary to opioid overdose.   How to use this medication? This medication is for use in the nose. Lay the person on their back. Support their neck with your hand  and allow the head to tilt back before giving the medication. The nasal spray should be given into 1 nostril. After giving the medication, move the person onto their side. Do not remove or test the nasal spray until ready to use. Get emergency medical help right away after giving the first dose of this medication, even if the person wakes up. You should be familiar with how to recognize the signs and symptoms of a narcotic overdose. If more doses are needed, give the additional dose in the other nostril. Talk to your care team about the use of this medication in children. While this medication may be prescribed for children as young as newborns for selected conditions, precautions do apply.  Naloxone Overdosage: If you think you have taken too much of this medicine contact a poison control center or emergency room at once.  NOTE: This medicine is only for you. Do not share this medicine with others.  What if I miss a dose? This does not apply.  What may interact with this medication? This is only used during an emergency. No interactions are expected during emergency use. This list may not describe all possible interactions. Give your health care provider a list of all the medicines, herbs, non-prescription drugs, or dietary supplements you use. Also tell them if you smoke, drink alcohol, or use illegal drugs. Some items may interact with your medicine.  What should I watch for while using this medication? Keep this medication ready for use in the case of an opioid overdose. Make sure that you have the phone number of your care team and local hospital ready. You may need to have additional doses of this medication. Each nasal spray contains a single dose. Some emergencies may require additional doses. After use, bring the treated person to the nearest hospital or call 911. Make sure the treating care team knows that the person has received a dose of this medication. You will receive additional  instructions on what to do during and after use of this medication before an emergency occurs.  What side effects may I notice from receiving this medication? Side effects that you should report to your care team as soon as possible: Allergic reactions--skin rash, itching, hives, swelling of the face, lips, tongue, or throat Side effects that usually do not require medical attention (report these to your care team if they continue or are bothersome): Constipation Dryness or irritation inside the nose Headache Increase in blood pressure Muscle spasms Stuffy nose Toothache This list may not describe all possible side effects. Call your doctor for medical advice about side effects. You may report side effects to FDA at 1-800-FDA-1088.  Where should I keep my medication? Because this is an emergency medication, you should keep it with you at all times.  Keep out of the reach of children and pets. Store between 20 and 25 degrees C (68 and 77 degrees F). Do not freeze. Throw away any unused medication after the expiration date. Keep in original box   until ready to use.  NOTE: This sheet is a summary. It may not cover all possible information. If you have questions about this medicine, talk to your doctor, pharmacist, or health care provider.   2023 Elsevier/Gold Standard (2021-06-11 00:00:00)  ____________________________________________________________________________________________   

## 2023-05-01 ENCOUNTER — Ambulatory Visit: Payer: Medicare HMO | Attending: Pain Medicine | Admitting: Pain Medicine

## 2023-05-01 ENCOUNTER — Encounter: Payer: Self-pay | Admitting: Pain Medicine

## 2023-05-01 VITALS — BP 124/67 | HR 92 | Temp 97.6°F | Resp 18 | Ht 62.0 in | Wt 207.0 lb

## 2023-05-01 DIAGNOSIS — I480 Paroxysmal atrial fibrillation: Secondary | ICD-10-CM | POA: Diagnosis not present

## 2023-05-01 DIAGNOSIS — M79604 Pain in right leg: Secondary | ICD-10-CM | POA: Insufficient documentation

## 2023-05-01 DIAGNOSIS — Z79891 Long term (current) use of opiate analgesic: Secondary | ICD-10-CM | POA: Diagnosis not present

## 2023-05-01 DIAGNOSIS — K219 Gastro-esophageal reflux disease without esophagitis: Secondary | ICD-10-CM | POA: Diagnosis not present

## 2023-05-01 DIAGNOSIS — M25552 Pain in left hip: Secondary | ICD-10-CM | POA: Insufficient documentation

## 2023-05-01 DIAGNOSIS — M545 Low back pain, unspecified: Secondary | ICD-10-CM | POA: Insufficient documentation

## 2023-05-01 DIAGNOSIS — Z720 Tobacco use: Secondary | ICD-10-CM | POA: Diagnosis not present

## 2023-05-01 DIAGNOSIS — M47816 Spondylosis without myelopathy or radiculopathy, lumbar region: Secondary | ICD-10-CM | POA: Insufficient documentation

## 2023-05-01 DIAGNOSIS — E785 Hyperlipidemia, unspecified: Secondary | ICD-10-CM | POA: Diagnosis not present

## 2023-05-01 DIAGNOSIS — M25562 Pain in left knee: Secondary | ICD-10-CM | POA: Diagnosis not present

## 2023-05-01 DIAGNOSIS — M5136 Other intervertebral disc degeneration, lumbar region: Secondary | ICD-10-CM | POA: Diagnosis not present

## 2023-05-01 DIAGNOSIS — J449 Chronic obstructive pulmonary disease, unspecified: Secondary | ICD-10-CM | POA: Diagnosis not present

## 2023-05-01 DIAGNOSIS — M25551 Pain in right hip: Secondary | ICD-10-CM | POA: Insufficient documentation

## 2023-05-01 DIAGNOSIS — G8929 Other chronic pain: Secondary | ICD-10-CM | POA: Diagnosis not present

## 2023-05-01 DIAGNOSIS — I509 Heart failure, unspecified: Secondary | ICD-10-CM | POA: Diagnosis not present

## 2023-05-01 DIAGNOSIS — Z0000 Encounter for general adult medical examination without abnormal findings: Secondary | ICD-10-CM | POA: Diagnosis not present

## 2023-05-01 DIAGNOSIS — G894 Chronic pain syndrome: Secondary | ICD-10-CM | POA: Insufficient documentation

## 2023-05-01 DIAGNOSIS — Z6838 Body mass index (BMI) 38.0-38.9, adult: Secondary | ICD-10-CM | POA: Diagnosis not present

## 2023-05-01 DIAGNOSIS — M79605 Pain in left leg: Secondary | ICD-10-CM | POA: Insufficient documentation

## 2023-05-01 DIAGNOSIS — Z79899 Other long term (current) drug therapy: Secondary | ICD-10-CM | POA: Diagnosis not present

## 2023-05-01 DIAGNOSIS — R7309 Other abnormal glucose: Secondary | ICD-10-CM | POA: Diagnosis not present

## 2023-05-01 DIAGNOSIS — Z1231 Encounter for screening mammogram for malignant neoplasm of breast: Secondary | ICD-10-CM | POA: Diagnosis not present

## 2023-05-01 MED ORDER — HYDROCODONE-ACETAMINOPHEN 5-325 MG PO TABS: 1.0000 | ORAL_TABLET | Freq: Three times a day (TID) | ORAL | 0 refills | Status: DC | PRN

## 2023-05-01 NOTE — Progress Notes (Signed)
Nursing Pain Medication Assessment:  Safety precautions to be maintained throughout the outpatient stay will include: orient to surroundings, keep bed in low position, maintain call bell within reach at all times, provide assistance with transfer out of bed and ambulation.  Medication Inspection Compliance: Pill count conducted under aseptic conditions, in front of the patient. Neither the pills nor the bottle was removed from the patient's sight at any time. Once count was completed pills were immediately returned to the patient in their original bottle.  Medication: Hydrocodone/APAP Pill/Patch Count:  15 of 90 pills remain Pill/Patch Appearance: Markings consistent with prescribed medication Bottle Appearance: Standard pharmacy container. Clearly labeled. Filled Date: 06 / 19 / 2024 Last Medication intake:  Today

## 2023-05-03 ENCOUNTER — Ambulatory Visit
Admission: RE | Admit: 2023-05-03 | Discharge: 2023-05-03 | Disposition: A | Discharge status: Home / Self Care | Payer: Medicare HMO | Source: Ambulatory Visit | Attending: Family Medicine | Admitting: Family Medicine

## 2023-05-03 ENCOUNTER — Encounter: Payer: Medicare HMO | Admitting: Family

## 2023-05-03 DIAGNOSIS — Z1231 Encounter for screening mammogram for malignant neoplasm of breast: Secondary | ICD-10-CM | POA: Diagnosis not present

## 2023-05-05 ENCOUNTER — Encounter: Payer: Self-pay | Admitting: Family

## 2023-05-05 ENCOUNTER — Ambulatory Visit: Payer: Medicare HMO | Attending: Family | Admitting: Family

## 2023-05-05 VITALS — BP 99/55 | HR 79 | Wt 217.6 lb

## 2023-05-05 DIAGNOSIS — F419 Anxiety disorder, unspecified: Secondary | ICD-10-CM | POA: Diagnosis not present

## 2023-05-05 DIAGNOSIS — I2510 Atherosclerotic heart disease of native coronary artery without angina pectoris: Secondary | ICD-10-CM | POA: Insufficient documentation

## 2023-05-05 DIAGNOSIS — I5032 Chronic diastolic (congestive) heart failure: Secondary | ICD-10-CM | POA: Diagnosis not present

## 2023-05-05 DIAGNOSIS — I428 Other cardiomyopathies: Secondary | ICD-10-CM | POA: Insufficient documentation

## 2023-05-05 DIAGNOSIS — Z79899 Other long term (current) drug therapy: Secondary | ICD-10-CM | POA: Diagnosis not present

## 2023-05-05 DIAGNOSIS — I272 Pulmonary hypertension, unspecified: Secondary | ICD-10-CM | POA: Diagnosis not present

## 2023-05-05 DIAGNOSIS — Z720 Tobacco use: Secondary | ICD-10-CM | POA: Diagnosis not present

## 2023-05-05 DIAGNOSIS — I4892 Unspecified atrial flutter: Secondary | ICD-10-CM | POA: Insufficient documentation

## 2023-05-05 DIAGNOSIS — I509 Heart failure, unspecified: Secondary | ICD-10-CM | POA: Insufficient documentation

## 2023-05-05 DIAGNOSIS — G8929 Other chronic pain: Secondary | ICD-10-CM | POA: Diagnosis not present

## 2023-05-05 DIAGNOSIS — F32A Depression, unspecified: Secondary | ICD-10-CM | POA: Insufficient documentation

## 2023-05-05 DIAGNOSIS — I10 Essential (primary) hypertension: Secondary | ICD-10-CM | POA: Diagnosis not present

## 2023-05-05 DIAGNOSIS — I110 Hypertensive heart disease with heart failure: Secondary | ICD-10-CM | POA: Insufficient documentation

## 2023-05-05 DIAGNOSIS — F17210 Nicotine dependence, cigarettes, uncomplicated: Secondary | ICD-10-CM | POA: Insufficient documentation

## 2023-05-05 DIAGNOSIS — J449 Chronic obstructive pulmonary disease, unspecified: Secondary | ICD-10-CM | POA: Insufficient documentation

## 2023-05-05 MED ORDER — WEGOVY 0.25 MG/0.5ML ~~LOC~~ SOAJ: 0.2500 mg | SUBCUTANEOUS | 5 refills | Status: DC

## 2023-05-05 NOTE — Patient Instructions (Addendum)
Decrease your spironolactone to 1/2 tablet every day  Increase potassium to 2 tablets twice a day

## 2023-05-05 NOTE — Progress Notes (Signed)
PCP: Dorothey Baseman, MD (last seen 07/24) Primary Cardiologist: Dorothyann Peng, MD (last seen 01/24) HF cardiologist: Arvilla Meres, MD (last seen 12/23)  HPI:   Theresa [name] is a 67 y/o female with a history of chronic low back pain, HTN, depression, COPD on long-term oxygen, anxiety, carpal tunnel syndrome, long-standing tobacco use and chronic heart failure.  Echo 04/29/22: EF of 60-65% along with mild MR and no thrombus.  Echo 08/08/21: EF of 60-65% along with mild LAE.  Echo 09/03/20:EF of >55% with mild LAE and severe TR.  Echo 07/07/2019: EF of 60-65% along with mild TR and moderately elevated PA pressure.  Echo 11/03/15: but unable to access results.  Echo 02/19/15: EF of 50% without valvular stenosis.   Catheterization done 07/11/2019 showed:  Prox RCA to Dist RCA lesion is 50% stenosed. Mid LAD lesion is 50% stenosed. Ost LM to Dist LM lesion is 10% stenosed. Hemodynamic findings consistent with moderate pulmonary hypertension. Normal overall left ventricular function of at least 60% Moderate coronary disease no significant obstructive disease Moderate pulmonary hypertension  Admitted 11/07/22 due to epistaxis and COPD exacerbation. Admitted 11/05/22 due to epistaxis. Admitted 08/09/22 due to cough and worsening SOB due to COPD exacerbation due to RSV. Hypotension due to overdiuresis. Blood transfusion given due to hemoglobin <7 due to slow GIB. Initially need increased oxygen at 8L but able to be weaned down to baseline 4L. [place] negative for DVT. Discharged after 17 days.   She presents today for a HF follow-up visit with a chief complaint of moderate SOB with minimal exertion. Chronic in nature although worse with the hot/ humid weather. Has associated fatigue, occasional palpitations and light-headedness along with this. Denies chest pain, cough, abdominal distention, pedal edema or difficulty sleeping. Wearing her continuous oxygen now on 6L around the clock as her sats were  dropping in the upper 70's-low 80's on her pulse oxygen  Last used furoscix ~ 10 days ago  ROS: All systems negative except as listed in HPI, PMH and Problem List.  SH:  Social History   Socioeconomic History   Marital status: Married    Spouse name: Theresa [name]   Number of children: 2   Years of education: Not on file   Highest education level: 12th grade  Occupational History   Occupation: disability  Tobacco Use   Smoking status: Some Days    Current packs/day: 1.50    Average packs/day: 1.5 packs/day for 44.0 years (66.0 ttl pk-yrs)    Types: Cigarettes   Smokeless tobacco: Never   Tobacco comments:    over a half pack  Vaping Use   Vaping status: Never Used  Substance and Sexual Activity   Alcohol use: No   Drug use: Yes    Comment: prescribed hydrocodone   Sexual activity: Not Currently    Birth control/protection: Surgical  Other Topics Concern   Not on file  Social History Narrative   ** Merged History Encounter **       Lives with spouse    Social Determinants of Health   Financial Resource Strain: Low Risk  (04/27/2023)   Received from [facility]   Overall Financial Resource Strain (CARDIA)    Difficulty of Paying Living Expenses: Not very hard  Food Insecurity: No Food Insecurity (04/27/2023)   Received from [facility]   Hunger Vital Sign    Worried About Running Out of Food in the Last Year: Never true    Ran Out of Food in  the Last Year: Never true  Transportation Needs: No Transportation Needs (04/27/2023)   Received from [facility] - Transportation    In the past 12 months, has lack of transportation kept you from medical appointments or from getting medications?: No    Lack of Transportation (Non-Medical): No  Physical Activity: Inactive (08/24/2022)   Received from [facility], [facility]   Exercise Vital Sign    Days of Exercise per Week: 0 days     Minutes of Exercise per Session: 0 min  Stress: Stress Concern Present (08/24/2022)   Received from [facility], [facility]   [employer] of Occupational Health - Occupational Stress Questionnaire    Feeling of Stress : Very much  Social Connections: Socially Integrated (08/24/2022)   Received from [facility], [facility]   Social Connection and Isolation Panel [NHANES]    Frequency of Communication with Friends and Family: More than three times a week    Frequency of Social Gatherings with Friends and Family: Never    Attends Religious Services: More than 4 times per year    Active Member of Clubs or Organizations: Yes    Attends Engineer, structural: More than 4 times per year    Marital Status: Married  Catering manager Violence: Not At Risk (11/08/2022)   Humiliation, Afraid, Rape, and Kick questionnaire    Fear of Current or Ex-Partner: No    Emotionally Abused: No    Physically Abused: No    Sexually Abused: No    FH:  Family History  Problem Relation Age of Onset   Heart disease Mother    Stroke Mother    Coronary artery disease Mother    Alcohol abuse Father    Heart disease Father    Lung cancer Sister    Cancer Sister    Breast cancer Sister    Cancer Brother    Cancer Brother    Kidney cancer Brother    Pneumonia Brother    Prostate cancer Neg Hx    Bladder Cancer Neg Hx     Past Medical History:  Diagnosis Date   Acute postoperative pain 10/03/2017   Anxiety    BiPAP (biphasic positive airway pressure) dependence    at hs   Carpal tunnel syndrome    CHF (congestive heart failure) (HCC)    1/18   CHF (congestive heart failure) (HCC)    Chronic generalized abdominal pain    Chronic rhinitis    COPD (chronic obstructive pulmonary disease) (HCC)    DDD (degenerative disc disease), cervical    DDD (degenerative disc disease), lumbosacral    Depression    Dyspnea     Edema    Flu    1/18   Gastritis    GERD (gastroesophageal reflux disease)    Hematuria    Hernia of abdominal wall 07/17/2018   Hernia, abdominal    Hypertension    Kidney stones    Low back pain    Lumbar radiculitis    Malodorous urine    Muscle weakness    Obesity    Oxygen dependent    Pneumonia due to COVID-19 virus 02/03/2020   Renal cyst    Sensory urge incontinence    Thyroid activity decreased    9/19   Tobacco abuse    Wheezing     Current Outpatient Medications  Medication Sig Dispense Refill  acetaminophen (TYLENOL) 500 MG tablet Take 1,000 mg by mouth every 8 (eight) hours as needed for mild pain or headache.     albuterol (VENTOLIN HFA) 108 (90 Base) MCG/ACT inhaler Inhale 2 puffs into the lungs every 6 (six) hours as needed for wheezing or shortness of breath. 8 g 1   amiodarone (PACERONE) 200 MG tablet Take 1 tablet (200 mg total) by mouth daily. 30 tablet 0   busPIRone (BUSPAR) 5 MG tablet TAKE 1 TABLET BY MOUTH TWICE DAILY 180 tablet 1   butalbital-acetaminophen-caffeine (FIORICET) 50-325-40 MG tablet Take 1 tablet by mouth every 6 (six) hours as needed for headache. LIMIT USE TO PREVENT MEDICATION OVERUSE HEADACHE 14 tablet 0   Cholecalciferol 50 MCG (2000 UT) CAPS Take 2,000 Units by mouth daily.      diclofenac Sodium (VOLTAREN) 1 % GEL Apply 2 g topically 4 (four) times daily as needed (lower back and/or shoulder pain).     diltiazem (CARDIZEM CD) 180 MG 24 hr capsule Take 180 mg by mouth daily.     FARXIGA 10 MG TABS tablet TAKE 1 TABLET BY MOUTH ONCE DAILY BEFOREBREAKFAST 30 tablet 5   ferrous sulfate 325 (65 FE) MG EC tablet Take 1 tablet (325 mg total) by mouth 2 (two) times daily with a meal. (Patient taking differently: Take 325 mg by mouth daily with breakfast.) 60 tablet 3   Fluticasone-Umeclidin-Vilant 100-62.5-25 MCG/INH AEPB Inhale 1 puff into the lungs daily.     FUROSCIX 80 MG/10ML CTKT Inject into the skin.     HYDROcodone-acetaminophen  (NORCO/VICODIN) 5-325 MG tablet Take 1 tablet by mouth every 8 (eight) hours as needed for severe pain. Must last 30 days 90 tablet 0   [START ON 05/31/2023] HYDROcodone-acetaminophen (NORCO/VICODIN) 5-325 MG tablet Take 1 tablet by mouth every 8 (eight) hours as needed for severe pain. Must last 30 days 90 tablet 0   [START ON 06/30/2023] HYDROcodone-acetaminophen (NORCO/VICODIN) 5-325 MG tablet Take 1 tablet by mouth every 8 (eight) hours as needed for severe pain. Must last 30 days 90 tablet 0   ipratropium-albuterol (DUONEB) 0.5-2.5 (3) MG/3ML SOLN USE 1 VIAL VIA NEBULIZER EVERY 6 HOURS AS NEEDED FOR SHORTNESS OF BREATH OR WHEEZING 1080 mL 0   melatonin 5 MG TABS Take 5 mg by mouth at bedtime.     naloxone (NARCAN) nasal spray 4 mg/0.1 mL Place 1 spray into the nose as needed for up to 365 doses (for opioid-induced respiratory depresssion). In case of emergency (overdose), spray once into each nostril. If no response within 3 minutes, repeat application and call 911. 1 each 0   omeprazole (PRILOSEC) 40 MG capsule Take 40 mg by mouth daily.     polyethylene glycol (MIRALAX / GLYCOLAX) 17 g packet Take 17 g by mouth daily as needed for mild constipation or moderate constipation.     potassium chloride SA (KLOR-CON M) 20 MEQ tablet Take 2 tablets every morning and 1 tablet every evening 270 tablet 3   pregabalin (LYRICA) 75 MG capsule Take 1 capsule (75 mg total) by mouth 3 (three) times daily. 90 capsule 2   roflumilast (DALIRESP) 500 MCG TABS tablet Take 500 mcg by mouth daily.     sertraline (ZOLOFT) 100 MG tablet Take 2 tablets (200 mg total) by mouth daily. Please schedule office visit before any future refill. 60 tablet 0   simvastatin (ZOCOR) 20 MG tablet Take 20 mg by mouth at bedtime.     spironolactone (ALDACTONE) 25 MG tablet TAKE  1 TABLET BY MOUTH ONCE DAILY 30 tablet 5   torsemide (DEMADEX) 20 MG tablet Take 40-80 mg by mouth See admin instructions. Take 4 tablets (80mg ) by mouth every  morning and take 2 tablets (40mg ) by mouth every afternoon     varenicline (CHANTIX CONTINUING MONTH PAK) 1 MG tablet Take 1 tablet (1 mg total) by mouth 2 (two) times daily. 60 tablet 5   No current facility-administered medications for this visit.   Vitals:   05/05/23 1342  BP: (!) 99/55  Pulse: 79  SpO2: 98%  Weight: 217 lb 9.6 oz (98.7 kg)   Wt Readings from Last 3 Encounters:  05/05/23 217 lb 9.6 oz (98.7 kg)  05/01/23 207 lb (93.9 kg)  02/21/23 215 lb (97.5 kg)   Lab Results  Component Value Date   CREATININE 0.71 11/08/2022   CREATININE 0.84 11/07/2022   CREATININE 0.71 11/06/2022   PHYSICAL EXAM:  General:  Well appearing. No resp difficulty HEENT: normal Neck: supple. JVP flat. No lymphadenopathy or thryomegaly appreciated. Cor: PMI normal. Regular rate & rhythm. No rubs, gallops or murmurs. Lungs: expiratory  wheezing throughout lung fields Abdomen: soft, nontender, nondistended. No hepatosplenomegaly. No bruits or masses.  Extremities: no cyanosis, clubbing, rash, edema Neuro: alert & oriented x3, cranial nerves grossly intact. Moves all 4 extremities w/o difficulty. Affect pleasant.   ECG: not done   ASSESSMENT & PLAN:  1: NICM with preserved ejection fraction- - likely due to pulmonary HTN/ OSA - NYHA class II - euvolemic today - weighing daily & she forgot her weight chart but says that she's gradually picking up weight; reminded to call for an overnight weight gain of >2 pounds or a weekly weight gain of >5 pounds - weight unchange from last visit here 3 months ago - continue farxiga 10mg  daily - continue torsemide 80mg  AM/ 40mg  PM - increase potassium to BID due to recent K+ of 3.2 - decrease spironolactone 12.5mg  daily due to BP - furoscix PRN; last used it ~ 10 days ago - saw [facility] provider (Bensimhon) 12/23 - wearing compression socks daily although doesn't have them on today - BNP 08/31/22 was 36.4  2: HTN- - BP 99/55; decreasing spiro  to 12.5mg  daily - saw PCP (Bronstein) 07/24 - BMP 05/01/23 reviewed and showed sodium 141, potassium 3.2, creatinine 1.0 and GFR 63 (increasing potassium per above) - order placed for BMP to be drawn the week before her next OV; if she doesn't get it done, will do it at her next OV  3: COPD- - wearing bipap nightly - oxygen at 6L around the clock on continuous system - saw pulmonology Meredeth Ide) 06/24  4: Tobacco use- - smoking 1 ppd - removes herself from the oxygen when smoking - complete cessation discussed  5: Atrial flutter- - previous cardioversion done - saw cardiologist Juliann Pares) 01/24; returns 07/24 - continue diltazem CD 180mg  daily - continue amiodarone 200mg  daily  6: Pulmonary HTN- - mild to moderate on cath in 2020 - cath with high output physiology. No shunts on TEE. No evidence of anomalous PV drainage or other shunts on CT - CT chest 2023 no PE. 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  - no role for selective vasodilators  - continue O2 and Bipap support  Return in 3-4 weeks, sooner if needed

## 2023-05-08 ENCOUNTER — Encounter: Payer: Self-pay | Admitting: Oncology

## 2023-05-08 ENCOUNTER — Other Ambulatory Visit (HOSPITAL_COMMUNITY): Payer: Self-pay

## 2023-05-08 DIAGNOSIS — J449 Chronic obstructive pulmonary disease, unspecified: Secondary | ICD-10-CM | POA: Diagnosis not present

## 2023-05-09 ENCOUNTER — Encounter: Payer: Self-pay | Admitting: Pain Medicine

## 2023-05-09 ENCOUNTER — Ambulatory Visit: Payer: Medicare HMO | Attending: Pain Medicine | Admitting: Pain Medicine

## 2023-05-09 ENCOUNTER — Ambulatory Visit: Admission: RE | Admit: 2023-05-09 | Payer: Medicare HMO | Source: Ambulatory Visit

## 2023-05-09 VITALS — BP 130/76 | HR 99 | Temp 97.2°F | Resp 16 | Ht 62.0 in | Wt 215.0 lb

## 2023-05-09 DIAGNOSIS — M79605 Pain in left leg: Secondary | ICD-10-CM | POA: Diagnosis not present

## 2023-05-09 DIAGNOSIS — R937 Abnormal findings on diagnostic imaging of other parts of musculoskeletal system: Secondary | ICD-10-CM | POA: Diagnosis not present

## 2023-05-09 DIAGNOSIS — M5136 Other intervertebral disc degeneration, lumbar region: Secondary | ICD-10-CM | POA: Insufficient documentation

## 2023-05-09 DIAGNOSIS — M545 Low back pain, unspecified: Secondary | ICD-10-CM | POA: Diagnosis not present

## 2023-05-09 DIAGNOSIS — M5416 Radiculopathy, lumbar region: Secondary | ICD-10-CM | POA: Diagnosis not present

## 2023-05-09 DIAGNOSIS — M5126 Other intervertebral disc displacement, lumbar region: Secondary | ICD-10-CM | POA: Insufficient documentation

## 2023-05-09 DIAGNOSIS — G8929 Other chronic pain: Secondary | ICD-10-CM | POA: Diagnosis not present

## 2023-05-09 MED ORDER — PENTAFLUOROPROP-TETRAFLUOROETH EX AERO
INHALATION_SPRAY | Freq: Once | CUTANEOUS | Status: AC
  Administered 2023-05-09: 30 via TOPICAL
  Filled 2023-05-09: qty 116

## 2023-05-09 MED ORDER — IOHEXOL 180 MG/ML  SOLN
INTRAMUSCULAR | Status: AC
  Filled 2023-05-09: qty 10

## 2023-05-09 MED ORDER — ROPIVACAINE HCL 2 MG/ML IJ SOLN
2.0000 mL | Freq: Once | INTRAMUSCULAR | Status: AC
  Administered 2023-05-09: 2 mL via EPIDURAL

## 2023-05-09 MED ORDER — LACTATED RINGERS IV SOLN: Freq: Once | INTRAVENOUS | Status: AC

## 2023-05-09 MED ORDER — LIDOCAINE HCL 2 % IJ SOLN
20.0000 mL | Freq: Once | INTRAMUSCULAR | Status: AC
  Administered 2023-05-09: 100 mg

## 2023-05-09 MED ORDER — FENTANYL CITRATE (PF) 100 MCG/2ML IJ SOLN
INTRAMUSCULAR | Status: AC
  Filled 2023-05-09: qty 2

## 2023-05-09 MED ORDER — ROPIVACAINE HCL 2 MG/ML IJ SOLN
INTRAMUSCULAR | Status: AC
  Filled 2023-05-09: qty 20

## 2023-05-09 MED ORDER — TRIAMCINOLONE ACETONIDE 40 MG/ML IJ SUSP
INTRAMUSCULAR | Status: AC
  Filled 2023-05-09: qty 1

## 2023-05-09 MED ORDER — IOHEXOL 180 MG/ML  SOLN
10.0000 mL | Freq: Once | INTRAMUSCULAR | Status: AC
  Administered 2023-05-09: 10 mL via EPIDURAL

## 2023-05-09 MED ORDER — FENTANYL CITRATE (PF) 100 MCG/2ML IJ SOLN: 25.0000 ug | INTRAMUSCULAR | Status: DC | PRN

## 2023-05-09 MED ORDER — LIDOCAINE HCL (PF) 2 % IJ SOLN
INTRAMUSCULAR | Status: AC
  Filled 2023-05-09: qty 10

## 2023-05-09 MED ORDER — MIDAZOLAM HCL 5 MG/5ML IJ SOLN
INTRAMUSCULAR | Status: AC
  Filled 2023-05-09: qty 5

## 2023-05-09 MED ORDER — MIDAZOLAM HCL 5 MG/5ML IJ SOLN
0.5000 mg | Freq: Once | INTRAMUSCULAR | Status: AC
  Administered 2023-05-09: 2 mg via INTRAVENOUS

## 2023-05-09 MED ORDER — TRIAMCINOLONE ACETONIDE 40 MG/ML IJ SUSP
40.0000 mg | Freq: Once | INTRAMUSCULAR | Status: AC
  Administered 2023-05-09: 40 mg

## 2023-05-09 MED ORDER — SODIUM CHLORIDE (PF) 0.9 % IJ SOLN
INTRAMUSCULAR | Status: AC
  Filled 2023-05-09: qty 10

## 2023-05-09 MED ORDER — SODIUM CHLORIDE 0.9% FLUSH
2.0000 mL | Freq: Once | INTRAVENOUS | Status: AC
  Administered 2023-05-09: 2 mL

## 2023-05-09 NOTE — Progress Notes (Signed)
PROVIDER NOTE: Interpretation of information contained herein should be left to medically-trained personnel. Specific patient instructions are provided elsewhere under "Patient Instructions" section of medical record. This document was created in part using STT-dictation technology, any transcriptional errors that may result from this process are unintentional.  Patient: Theresa [name] Type: Established DOB: 13-Mar-1956 MRN: [id number] PCP: Dorothey Baseman, MD  Service: Procedure DOS: 05/09/2023 Setting: Ambulatory Location: Ambulatory outpatient facility Delivery: Face-to-face Provider: Oswaldo Done, MD Specialty: Interventional Pain Management Specialty designation: 09 Location: Outpatient facility Ref. Prov.: Delano Metz, MD       Interventional Therapy   Procedure: Lumbar epidural steroid injection (LESI) (interlaminar) #1    Laterality: Left   Level:  L2-3 Level.  Imaging: Fluoroscopic guidance         Anesthesia: Local anesthesia (1-2% Lidocaine) Anxiolysis: IV Versed 2.0 mg Sedation: Moderate Sedation None required. No Fentanyl administered.         DOS: 05/09/2023  Performed by: Oswaldo Done, MD  Purpose: Diagnostic/Therapeutic Indications: Lumbar radicular pain of intraspinal etiology of more than 4 weeks that has failed to respond to conservative therapy and is severe enough to impact quality of life or function. 1. Chronic left-sided lumbar radiculopathy   2. Chronic lower extremity pain (2ry area of Pain) (Left)   3. Chronic lumbar radicular pain (L4 & S1 Dermatome) (Left)   4. DDD (degenerative disc disease), lumbar   5. Lumbar disc extrusion (L2-3) (Left)    NAS-11 Pain score:   Pre-procedure: 10-Worst pain ever/10   Post-procedure: 0-No pain/10      Position / Prep / Materials:  Position: Prone w/ head of the table raised (slight reverse trendelenburg) to facilitate breathing.  Prep solution: DuraPrep (Iodine Povacrylex [0.7% available  iodine] and Isopropyl Alcohol, 74% w/w) Prep Area: Entire Posterior Lumbar Region from lower scapular tip down to mid buttocks area and from flank to flank. Materials:  Tray: Epidural tray Needle(s):  Type: Epidural needle (Tuohy) Gauge (G):  17 Length: Regular (3.5-in) Qty: 1   H&P (Pre-op Assessment):  [name] is a 67 y.o. (year old), female patient, seen today for interventional treatment. She  has a past surgical history that includes Abdominal hysterectomy; Tonsillectomy; Carpal tunnel release (Left, 2012); Tubal ligation; epigastric hernia repair (N/A, 08/13/2018); Umbilical hernia repair (N/A, 08/13/2018); RIGHT/LEFT HEART CATH AND CORONARY ANGIOGRAPHY (N/A, 07/11/2019); TEE without cardioversion (N/A, 04/29/2022); Cardioversion (N/A, 04/29/2022); Esophagogastroduodenoscopy (N/A, 06/21/2022); and cataract surgery (Bilateral). [name] has a current medication list which includes the following prescription(s): acetaminophen, albuterol, amiodarone, buspirone, butalbital-acetaminophen-caffeine, cholecalciferol, diclofenac sodium, diltiazem, farxiga, ferrous sulfate, fluticasone-umeclidin-vilant, furoscix, hydrocodone-acetaminophen, [START ON 05/31/2023] hydrocodone-acetaminophen, [START ON 06/30/2023] hydrocodone-acetaminophen, ipratropium-albuterol, melatonin, naloxone, omeprazole, polyethylene glycol, potassium chloride sa, roflumilast, wegovy, sertraline, simvastatin, spironolactone, torsemide, and pregabalin, and the following Facility-Administered Medications: fentanyl and lactated ringers. Her primarily concern today is the Back Pain (lower)  Initial Vital Signs:  Pulse/HCG Rate: 99ECG Heart Rate: 80 (NSR) Temp: (!) 97.3 F (36.3 C) Resp: 18 BP: 124/70 SpO2: 93 % (O2 at 6 liters BNC)  BMI: Estimated body mass index is 39.32 kg/m as calculated from the following:   Height as of this encounter: 5\' 2"  (1.575 m).   Weight as of this encounter: 215 lb (97.5 kg).  Risk  Assessment: Allergies: Reviewed. She is allergic to gabapentin, codeine, and meloxicam.  Allergy Precautions: None required Coagulopathies: Reviewed. None identified.  Blood-thinner therapy: None at this time Active Infection(s): Reviewed. None identified. [name] is afebrile  Site Confirmation: [name] was  asked to confirm the procedure and laterality before marking the site Procedure checklist: Completed Consent: Before the procedure and under the influence of no sedative(s), amnesic(s), or anxiolytics, the patient was informed of the treatment options, risks and possible complications. To fulfill our ethical and legal obligations, as recommended by the American Medical Association's Code of Ethics, I have informed the patient of my clinical impression; the nature and purpose of the treatment or procedure; the risks, benefits, and possible complications of the intervention; the alternatives, including doing nothing; the risk(s) and benefit(s) of the alternative treatment(s) or procedure(s); and the risk(s) and benefit(s) of doing nothing. The patient was provided information about the general risks and possible complications associated with the procedure. These may include, but are not limited to: failure to achieve desired goals, infection, bleeding, organ or nerve damage, allergic reactions, paralysis, and death. In addition, the patient was informed of those risks and complications associated to Spine-related procedures, such as failure to decrease pain; infection (i.e.: Meningitis, epidural or intraspinal abscess); bleeding (i.e.: epidural hematoma, subarachnoid hemorrhage, or any other type of intraspinal or peri-dural bleeding); organ or nerve damage (i.e.: Any type of peripheral nerve, nerve root, or spinal cord injury) with subsequent damage to sensory, motor, and/or autonomic systems, resulting in permanent pain, numbness, and/or weakness of one or several areas of the body; allergic  reactions; (i.e.: anaphylactic reaction); and/or death. Furthermore, the patient was informed of those risks and complications associated with the medications. These include, but are not limited to: allergic reactions (i.e.: anaphylactic or anaphylactoid reaction(s)); adrenal axis suppression; blood sugar elevation that in diabetics may result in ketoacidosis or comma; water retention that in patients with history of congestive heart failure may result in shortness of breath, pulmonary edema, and decompensation with resultant heart failure; weight gain; swelling or edema; medication-induced neural toxicity; particulate matter embolism and blood vessel occlusion with resultant organ, and/or nervous system infarction; and/or aseptic necrosis of one or more joints. Finally, the patient was informed that Medicine is not an exact science; therefore, there is also the possibility of unforeseen or unpredictable risks and/or possible complications that may result in a catastrophic outcome. The patient indicated having understood very clearly. We have given the patient no guarantees and we have made no promises. Enough time was given to the patient to ask questions, all of which were answered to the patient's satisfaction. Ms. Fifield has indicated that she wanted to continue with the procedure. Attestation: I, the ordering provider, attest that I have discussed with the patient the benefits, risks, side-effects, alternatives, likelihood of achieving goals, and potential problems during recovery for the procedure that I have provided informed consent. Date  Time: 05/09/2023  8:42 AM   Pre-Procedure Preparation:  Monitoring: As per clinic protocol. Respiration, ETCO2, SpO2, BP, heart rate and rhythm monitor placed and checked for adequate function Safety Precautions: Patient was assessed for positional comfort and pressure points before starting the procedure. Time-out: I initiated and conducted the "Time-out"  before starting the procedure, as per protocol. The patient was asked to participate by confirming the accuracy of the "Time Out" information. Verification of the correct person, site, and procedure were performed and confirmed by me, the nursing staff, and the patient. "Time-out" conducted as per Joint Commission's Universal Protocol (UP.01.01.01). Time: 0917 Start Time: 0917 hrs.  Description/Narrative of Procedure:          Target: Epidural space via interlaminar opening, initially targeting the lower laminar border of the superior vertebral body. Region: Lumbar  Approach: Percutaneous paravertebral  Rationale (medical necessity): procedure needed and proper for the diagnosis and/or treatment of the patient's medical symptoms and needs. Procedural Technique Safety Precautions: Aspiration looking for blood return was conducted prior to all injections. At no point did we inject any substances, as a needle was being advanced. No attempts were made at seeking any paresthesias. Safe injection practices and needle disposal techniques used. Medications properly checked for expiration dates. SDV (single dose vial) medications used. Description of the Procedure: Protocol guidelines were followed. The procedure needle was introduced through the skin, ipsilateral to the reported pain, and advanced to the target area. Bone was contacted and the needle walked caudad, until the lamina was cleared. The epidural space was identified using "loss-of-resistance technique" with 2-3 ml of PF-NaCl (0.9% NSS), in a 5cc LOR glass syringe.  Vitals:   05/09/23 0914 05/09/23 0920 05/09/23 0925 05/09/23 0934  BP: (!) 148/90 137/83 129/73 130/76  Pulse:      Resp: 18 17 15 16   Temp:    (!) 97.2 F (36.2 C)  TempSrc:      SpO2: 100% 100% 99% 96%  Weight:      Height:        Start Time: 0917 hrs. End Time: 0925 hrs.  Imaging Guidance (Spinal):          Type of Imaging Technique: Fluoroscopy Guidance  (Spinal) Indication(s): Assistance in needle guidance and placement for procedures requiring needle placement in or near specific anatomical locations not easily accessible without such assistance. Exposure Time: Please see nurses notes. Contrast: Before injecting any contrast, we confirmed that the patient did not have an allergy to iodine, shellfish, or radiological contrast. Once satisfactory needle placement was completed at the desired level, radiological contrast was injected. Contrast injected under live fluoroscopy. No contrast complications. See chart for type and volume of contrast used. Fluoroscopic Guidance: I was personally present during the use of fluoroscopy. "Tunnel Vision Technique" used to obtain the best possible view of the target area. Parallax error corrected before commencing the procedure. "Direction-depth-direction" technique used to introduce the needle under continuous pulsed fluoroscopy. Once target was reached, antero-posterior, oblique, and lateral fluoroscopic projection used confirm needle placement in all planes. Images permanently stored in EMR. Interpretation: I personally interpreted the imaging intraoperatively. Adequate needle placement confirmed in multiple planes. Appropriate spread of contrast into desired area was observed. No evidence of afferent or efferent intravascular uptake. No intrathecal or subarachnoid spread observed. Permanent images saved into the patient's record.  Antibiotic Prophylaxis:   Anti-infectives (From admission, onward)    None      Indication(s): None identified  Post-operative Assessment:  Post-procedure Vital Signs:  Pulse/HCG Rate: 9982 Temp: (!) 97.2 F (36.2 C) Resp: 16 BP: 130/76 SpO2: 96 %  EBL: None  Complications: No immediate post-treatment complications observed by team, or reported by patient.  Note: The patient tolerated the entire procedure well. A repeat set of vitals were taken after the procedure and  the patient was kept under observation following institutional policy, for this type of procedure. Post-procedural neurological assessment was performed, showing return to baseline, prior to discharge. The patient was provided with post-procedure discharge instructions, including a section on how to identify potential problems. Should any problems arise concerning this procedure, the patient was given instructions to immediately contact us, at any time, without hesitation. In any case, we plan to contact the patient by telephone for a follow-up status report regarding this interventional procedure.  Comments:  No additional  relevant information.  Plan of Care (POC)  Orders:  Orders Placed This Encounter  Procedures   Lumbar Epidural Injection    Scheduling Instructions:     Procedure: Interlaminar LESI L2-3     Laterality: Left     Sedation: Patient's choice     Timeframe: Today    Order Specific Question:   Where will this procedure be performed?    Answer:   [facility]   DG [facility] C-ARM 1-60 MIN NO REPORT    Intraoperative interpretation by procedural physician at [facility].    Standing Status:   Standing    Number of Occurrences:   1    Order Specific Question:   Reason for exam:    Answer:   Assistance in needle guidance and placement for procedures requiring needle placement in or near specific anatomical locations not easily accessible without such assistance.   Informed Consent Details: Physician/Practitioner Attestation; Transcribe to consent form and obtain patient signature    Note: Always confirm laterality of pain with Ms. Chandra Batch, before procedure. Transcribe to consent form and obtain patient signature.    Order Specific Question:   Physician/Practitioner attestation of informed consent for procedure/surgical case    Answer:   I, the physician/practitioner, attest that I have discussed with the patient the benefits, risks, side effects,  alternatives, likelihood of achieving goals and potential problems during recovery for the procedure that I have provided informed consent.    Order Specific Question:   Procedure    Answer:   Lumbar epidural steroid injection under fluoroscopic guidance    Order Specific Question:   Physician/Practitioner performing the procedure    Answer:   Anesia Blackwell A. Laban Emperor, MD    Order Specific Question:   Indication/Reason    Answer:   Low back and/or lower extremity pain secondary to lumbar radiculitis   Care order/instruction: Please confirm that the patient has stopped the Xarelto (Rivaroxaban) x 3 days prior to procedure or surgery.    Please confirm that the patient has stopped the Xarelto (Rivaroxaban) x 3 days prior to procedure or surgery.    Standing Status:   Standing    Number of Occurrences:   1   Provide equipment / supplies at bedside    Procedural tray: Epidural Tray (Disposable  single use) Skin infiltration needle: Regular 1.5-in, 25-G, (x1) Block needle size: Regular standard Catheter: No catheter required    Standing Status:   Standing    Number of Occurrences:   1    Order Specific Question:   Specify    Answer:   Epidural Tray   Chronic Opioid Analgesic:  Hydrocodone/APAP 5/325 one tablet every 8 hours (15 mg/day of hydrocodone) MME/day: 15 mg/day.   Medications ordered for procedure: Meds ordered this encounter  Medications   iohexol (OMNIPAQUE) 180 MG/ML injection 10 mL    Must be Myelogram-compatible. If not available, you may substitute with a water-soluble, non-ionic, hypoallergenic, myelogram-compatible radiological contrast medium.   lidocaine (XYLOCAINE) 2 % (with pres) injection 400 mg   pentafluoroprop-tetrafluoroeth (GEBAUERS) aerosol   lactated ringers infusion   midazolam (VERSED) 5 MG/5ML injection 0.5-2 mg    Make sure Flumazenil is available in the pyxis when using this medication. If oversedation occurs, administer 0.2 mg IV over 15 sec. If after 45 sec  no response, administer 0.2 mg again over 1 min; may repeat at 1 min intervals; not to exceed 4 doses (1 mg)   fentaNYL (SUBLIMAZE) injection 25-50 mcg  Make sure Narcan is available in the pyxis when using this medication. In the event of respiratory depression (RR< 8/min): Titrate NARCAN (naloxone) in increments of 0.1 to 0.2 mg IV at 2-3 minute intervals, until desired degree of reversal.   sodium chloride flush (NS) 0.9 % injection 2 mL   ropivacaine (PF) 2 mg/mL (0.2%) (NAROPIN) injection 2 mL   triamcinolone acetonide (KENALOG-40) injection 40 mg   Medications administered: We administered iohexol, lidocaine, pentafluoroprop-tetrafluoroeth, lactated ringers, midazolam, sodium chloride flush, ropivacaine (PF) 2 mg/mL (0.2%), and triamcinolone acetonide.  See the medical record for exact dosing, route, and time of administration.  Follow-up plan:   Return in about 2 weeks (around 05/23/2023) for Proc-day (T,Th), (Face2F), (PPE).       Interventional Therapies  Risk Factors  Considerations  Medical Comorbidities:  COPD  Emphysema  Chronic Smoker  O2 dependent  SOB  CHF  Hx. of A-Fib (Cardioverted)  Obesity  CAD   NOTE: NO further Lumbar RFA until BMI <35. Patient taken off of Xarelto after cardioversion.   Planned  Pending:   Therapeutic left L2-3 LESI #1 (05/09/2023)     Under consideration:   Diagnostic bilateral cervical facet MBB #1  Palliative left L2-3 LESI #4  NOTE: NO further Lumbar RFA until BMI <35.   Completed:   Palliative right lumbar facet MBB x7 (09/20/2022) (100/100/100x5days/0)  Palliative left lumbar facet MBB x6 (11/11/2021) (100/100/0/0)  Therapeutic left L2-3 LESI x3 (09/23/2021) (80/80/80)  Diagnostic/palliative left L4 TFESI x1 (12/26/2017) (100/100/50/>50)  Diagnostic/palliative left L4-5 LESI x2 (07/30/2019) (100/100/0/0)  Diagnostic/palliative right L4 TFESI x1 (01/25/2018) (100/100/50/50)  Diagnostic/palliative right L4-5 LESI x1  (01/25/2018) (100/100/50/50)  Palliative left lumbar facet RFA x1 (06/27/2017) (0/0/100/>50)  Palliative right lumbar facet RFA x1 (10/03/2017) (100/100/75)    Therapeutic  Palliative (PRN) options:   Evaluation needed before each determination.   Completed by other providers:      Pharmacotherapy  Nonopioids transferred 08/03/2020: Gabapentin and baclofen Note: Patient taken off of Xarelto after cardioversion.      Recent Visits Date Type Provider Dept  05/01/23 Office Visit Delano Metz, MD [facility]  03/28/23 Office Visit Delano Metz, MD [facility]  02/21/23 Procedure visit Delano Metz, MD [facility]  Showing recent visits within past 90 days and meeting all other requirements Today's Visits Date Type Provider Dept  05/09/23 Procedure visit Delano Metz, MD [facility]  Showing today's visits and meeting all other requirements Future Appointments Date Type Provider Dept  05/23/23 Appointment Delano Metz, MD [facility]  07/26/23 Appointment Delano Metz, MD [facility]  Showing future appointments within next 90 days and meeting all other requirements  Disposition: Discharge home  Discharge (Date  Time): 05/09/2023;   hrs.   Primary Care Physician: Dorothey Baseman, MD Location: [facility] Note by: Oswaldo Done, MD (TTS technology used. I apologize for any typographical errors that were not detected and corrected.) Date: 05/09/2023; Time: 9:37 AM  Disclaimer:  Medicine is not an Visual merchandiser. The only guarantee in medicine is that nothing is guaranteed. It is important to note that the decision to proceed with this intervention was based on the information collected from the patient. The Data and conclusions were drawn from the patient's questionnaire, the interview, and the physical examination. Because the information was provided in  large part by the patient, it cannot be guaranteed that it has not been purposely or unconsciously manipulated. Every effort has been made  to obtain as much relevant data as possible for this evaluation. It is important to note that the conclusions that lead to this procedure are derived in large part from the available data. Always take into account that the treatment will also be dependent on availability of resources and existing treatment guidelines, considered by other Pain Management Practitioners as being common knowledge and practice, at the time of the intervention. For Medico-Legal purposes, it is also important to point out that variation in procedural techniques and pharmacological choices are the acceptable norm. The indications, contraindications, technique, and results of the above procedure should only be interpreted and judged by a Board-Certified Interventional Pain Specialist with extensive familiarity and expertise in the same exact procedure and technique.

## 2023-05-09 NOTE — Patient Instructions (Signed)

## 2023-05-10 ENCOUNTER — Telehealth: Payer: Self-pay | Admitting: *Deleted

## 2023-05-10 NOTE — Telephone Encounter (Signed)
Attempted to call for post procedure follow-up. No answer, no voicemail. 

## 2023-05-15 DIAGNOSIS — J432 Centrilobular emphysema: Secondary | ICD-10-CM | POA: Diagnosis not present

## 2023-05-15 DIAGNOSIS — I5032 Chronic diastolic (congestive) heart failure: Secondary | ICD-10-CM | POA: Diagnosis not present

## 2023-05-15 DIAGNOSIS — I272 Pulmonary hypertension, unspecified: Secondary | ICD-10-CM | POA: Diagnosis not present

## 2023-05-15 DIAGNOSIS — I2510 Atherosclerotic heart disease of native coronary artery without angina pectoris: Secondary | ICD-10-CM | POA: Diagnosis not present

## 2023-05-15 DIAGNOSIS — J441 Chronic obstructive pulmonary disease with (acute) exacerbation: Secondary | ICD-10-CM | POA: Diagnosis not present

## 2023-05-15 DIAGNOSIS — I700 Atherosclerosis of aorta: Secondary | ICD-10-CM | POA: Diagnosis not present

## 2023-05-15 DIAGNOSIS — I4891 Unspecified atrial fibrillation: Secondary | ICD-10-CM | POA: Diagnosis not present

## 2023-05-15 DIAGNOSIS — J439 Emphysema, unspecified: Secondary | ICD-10-CM | POA: Diagnosis not present

## 2023-05-15 DIAGNOSIS — R600 Localized edema: Secondary | ICD-10-CM | POA: Diagnosis not present

## 2023-05-15 DIAGNOSIS — M7989 Other specified soft tissue disorders: Secondary | ICD-10-CM | POA: Diagnosis not present

## 2023-05-15 DIAGNOSIS — R0602 Shortness of breath: Secondary | ICD-10-CM | POA: Diagnosis not present

## 2023-05-22 DIAGNOSIS — I25119 Atherosclerotic heart disease of native coronary artery with unspecified angina pectoris: Secondary | ICD-10-CM | POA: Diagnosis not present

## 2023-05-22 DIAGNOSIS — M1990 Unspecified osteoarthritis, unspecified site: Secondary | ICD-10-CM | POA: Diagnosis not present

## 2023-05-22 DIAGNOSIS — K219 Gastro-esophageal reflux disease without esophagitis: Secondary | ICD-10-CM | POA: Diagnosis not present

## 2023-05-22 DIAGNOSIS — E785 Hyperlipidemia, unspecified: Secondary | ICD-10-CM | POA: Diagnosis not present

## 2023-05-22 DIAGNOSIS — F411 Generalized anxiety disorder: Secondary | ICD-10-CM | POA: Diagnosis not present

## 2023-05-22 DIAGNOSIS — G4733 Obstructive sleep apnea (adult) (pediatric): Secondary | ICD-10-CM | POA: Diagnosis not present

## 2023-05-22 DIAGNOSIS — I4891 Unspecified atrial fibrillation: Secondary | ICD-10-CM | POA: Diagnosis not present

## 2023-05-22 DIAGNOSIS — E876 Hypokalemia: Secondary | ICD-10-CM | POA: Diagnosis not present

## 2023-05-22 DIAGNOSIS — D6869 Other thrombophilia: Secondary | ICD-10-CM | POA: Diagnosis not present

## 2023-05-22 DIAGNOSIS — E039 Hypothyroidism, unspecified: Secondary | ICD-10-CM | POA: Diagnosis not present

## 2023-05-22 DIAGNOSIS — M545 Low back pain, unspecified: Secondary | ICD-10-CM | POA: Diagnosis not present

## 2023-05-22 DIAGNOSIS — M4800 Spinal stenosis, site unspecified: Secondary | ICD-10-CM | POA: Diagnosis not present

## 2023-05-22 NOTE — Progress Notes (Addendum)
PROVIDER NOTE: Information contained herein reflects review and annotations entered in association with encounter. Interpretation of such information and data should be left to medically-trained personnel. Information provided to patient can be located elsewhere in the medical record under "Patient Instructions". Document created using STT-dictation technology, any transcriptional errors that may result from process are unintentional.    Patient: Theresa [name]  Service Category: E/M  Provider: Oswaldo Done, MD  DOB: 1955/12/23  DOS: 05/23/2023  Referring Provider: Dorothey Baseman, MD  MRN: [id number]  Specialty: Interventional Pain Management  PCP: Dorothey Baseman, MD  Type: Established Patient  Setting: Ambulatory outpatient    Location: Office  Delivery: Face-to-face     HPI  [name], a 67 y.o. year old female, is here today because of her Chronic pain of left lower extremity [M79.605, G89.29]. [name] primary complain today is Back Pain (lower)  Pertinent problems: [name] has Neuritis or radiculitis due to rupture of lumbar intervertebral disc; DDD (degenerative disc disease), lumbar; Carpal tunnel syndrome; Neurogenic pain; Neuropathic pain; Musculoskeletal pain; Lumbar spondylosis (L4-5); Chronic low back pain (1ry area of Pain) (Bilateral) (R>L) w/o sciatica; Chronic lower extremity pain (2ry area of Pain) (Left); Chronic lumbar radicular pain (L4 & S1 Dermatome) (Left); Chronic shoulder pain (Right side); Chronic upper extremity pain (Right-sided); Cervical radiculitis (Right side); Abnormal x-ray of lumbar spine; Chronic pain syndrome (significant psychosocial component); 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; 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); Lumbar facet syndrome (Bilateral) (R>L); Chronic sacroiliac joint pain (Bilateral); Lumbar facet hypertrophy (L3-4, L5-S1) (Bilateral); Lumbar spinal stenosis (L4-5); Lumbar foraminal stenosis  (L4-5) (Left); 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); Lumbosacral radiculopathy at L4; Chronic shoulder pain (Left); Arthralgia of acromioclavicular joint (Right); Acromioclavicular joint DJD (Right); Osteoarthritis of shoulder (Right); Chronic hip pain (3ry area of Pain) (Bilateral) (L>R); Chronic lower extremity pain (Bilateral) (R>L); Cervicalgia (Bilateral) (R>L); Spondylosis without myelopathy or radiculopathy, lumbosacral region; Chronic knee pain (Left); Chronic left-sided lumbar radiculopathy; Abnormal MRI, lumbar spine (12/09/2021); 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 Pain Assessment: Severity of Chronic pain is reported as a 5 /10. Location: Back Lower/radiates down right leg to calf. Onset: More than a month ago. Quality: Dull, Stabbing. Timing: Constant. Modifying factor(s): denies. Vitals:  height is 5\' 2"  (1.575 m) and weight is 207 lb (93.9 kg). Her temporal temperature is 98.1 F (36.7 C). Her blood pressure is 123/68 and her pulse is 83. Her oxygen saturation is 92%.  BMI: Estimated body mass index is 37.86 kg/m as calculated from the following:   Height as of this encounter: 5\' 2"  (1.575 m).   Weight as of this encounter: 207 lb (93.9 kg). Last encounter: 05/01/2023. Last procedure: 05/09/2023.  Reason for encounter: post-procedure evaluation and assessment. The patient indicates having attained 100% relief of the pain for the duration of local anesthetic which then went down to an ongoing 25% improvement.  However, she has noticed a significant improvement in her function and range of motion and for this reason she wants to go ahead and repeat LESI.  The patient is unable to do physical  therapy secondary to severe oxygen dependent COPD.  Today I took the time to go over the images of her last 2 lumbar MRIs, specifically  looking at a documented left-sided disc extrusion at the L2-3 level.  Even though the second MRI describes this extrusion to be gone, personal review of the images would suggest that there is still an area likely to be affected secondary to residual effects from that extrusion.  For this reason, I have proposed to the patient to go back to the same area and repeat that lumbar epidural steroid injection since it seems that she has been getting some benefit from that.  She understood and accepted.  Post-procedure evaluation   Procedure: Lumbar epidural steroid injection (LESI) (interlaminar) #1    Laterality: Left   Level:  L2-3 Level.  Imaging: Fluoroscopic guidance         Anesthesia: Local anesthesia (1-2% Lidocaine) Anxiolysis: IV Versed 2.0 mg Sedation: Moderate Sedation None required. No Fentanyl administered.         DOS: 05/09/2023  Performed by: Oswaldo Done, MD  Purpose: Diagnostic/Therapeutic Indications: Lumbar radicular pain of intraspinal etiology of more than 4 weeks that has failed to respond to conservative therapy and is severe enough to impact quality of life or function. 1. Chronic left-sided lumbar radiculopathy   2. Chronic lower extremity pain (2ry area of Pain) (Left)   3. Chronic lumbar radicular pain (L4 & S1 Dermatome) (Left)   4. DDD (degenerative disc disease), lumbar   5. Lumbar disc extrusion (L2-3) (Left)    NAS-11 Pain score:   Pre-procedure: 10-Worst pain ever/10   Post-procedure: 0-No pain/10      Effectiveness:  Initial hour after procedure: 100 %. Subsequent 4-6 hours post-procedure: 100 %. Analgesia past initial 6 hours: 25 %. Ongoing improvement:  Analgesic: The patient indicates having attained 100% relief of the pain for the duration of local anesthetic which then went down to an ongoing 25% improvement.   However, she has noticed a significant improvement in her function and range of motion and for this reason she wants to go ahead and repeat LESI. Function: [name] reports improvement in function ROM: [name] reports improvement in ROM  Pharmacotherapy Assessment  Analgesic: Hydrocodone/APAP 5/325 one tablet every 8 hours (15 mg/day of hydrocodone) MME/day: 15 mg/day.   Monitoring: [place] PMP: PDMP reviewed during this encounter.       Pharmacotherapy: No side-effects or adverse reactions reported. Compliance: No problems identified. Effectiveness: Clinically acceptable.  Florina Ou, RN  05/23/2023  2:39 PM  Sign when Signing Visit Safety precautions to be maintained throughout the outpatient stay will include: orient to surroundings, keep bed in low position, maintain call bell within reach at all times, provide assistance with transfer out of bed and ambulation.     No results found for: "CBDTHCR" No results found for: "D8THCCBX" No results found for: "D9THCCBX"  UDS:  Summary  Date Value Ref Range Status  10/26/2022 Note  Final    Comment:    ==================================================================== [employer] Select 13 (MW) ==================================================================== Specimen Alert Note: Urinary creatinine is low; ability to detect some drugs may be compromised. Interpret results with caution. (Creatinine) ==================================================================== Test                             Result       Flag       Units  Drug Present and Declared for Prescription Verification   Hydrocodone                    [phone]  EXPECTED   ng/mg creat   Norhydrocodone                 1400         EXPECTED   ng/mg creat    Sources of hydrocodone include scheduled prescription medications.    Norhydrocodone is an expected metabolite of hydrocodone.  Drug Absent but Declared for Prescription Verification   Butalbital                      Not Detected UNEXPECTED ==================================================================== Test                      Result    Flag   Units      Ref Range   Creatinine              10        LL     mg/dL      >=16 ==================================================================== Declared Medications:  The flagging and interpretation on this report are based on the  following declared medications.  Unexpected results may arise from  inaccuracies in the declared medications.   **Note: The testing scope of this panel includes these medications:   Butalbital (Fioricet)  Hydrocodone (Norco)   **Note: The testing scope of this panel does not include the  following reported medications:   Acetaminophen (Tylenol)  Acetaminophen (Fioricet)  Acetaminophen (Norco)  Albuterol (Ventolin HFA)  Albuterol (Duoneb)  Amiodarone  Buspirone (Buspar)  Caffeine (Fioricet)  Cholecalciferol  Dapagliflozin (Farxiga)  Diclofenac (Voltaren)  Diltiazem (Cardizem)  Fluticasone (Trelegy)  Helium  Ipratropium (Duoneb)  Iron  Metolazone (Zaroxolyn)  Naloxone (Narcan)  Oxygen  Pantoprazole (Protonix)  Potassium (Klor-Con)  Rivaroxaban (Xarelto)  Roflumilast (Daliresp)  Sertraline (Zoloft)  Simvastatin (Zocor)  Spironolactone (Aldactone)  Torsemide (Demadex)  Umeclidinium (Trelegy)  Varenicline (Chantix)  Vilanterol (Trelegy) ==================================================================== For clinical consultation, please call [phone]. ====================================================================       ROS  Constitutional: Denies any fever or chills Gastrointestinal: No reported hemesis, hematochezia, vomiting, or acute GI distress Musculoskeletal: Denies any acute onset joint swelling, redness, loss of ROM, or weakness Neurological: No reported episodes of acute onset apraxia, aphasia, dysarthria, agnosia, amnesia, paralysis, loss of coordination, or  loss of consciousness  Medication Review  Cholecalciferol, Fluticasone-Umeclidin-Vilant, Furosemide, HYDROcodone-acetaminophen, Semaglutide-Weight Management, acetaminophen, albuterol, amiodarone, busPIRone, butalbital-acetaminophen-caffeine, dapagliflozin propanediol, diclofenac Sodium, diltiazem, ferrous sulfate, ipratropium-albuterol, melatonin, naloxone, omeprazole, polyethylene glycol, potassium chloride SA, pregabalin, roflumilast, sertraline, simvastatin, spironolactone, and torsemide  History Review  Allergy: Ms. Lusky is allergic to gabapentin, codeine, and meloxicam. Drug: Ms. Pinks  reports current drug use. Alcohol:  reports no history of alcohol use. Tobacco:  reports that she has been smoking cigarettes. She has a 66 pack-year smoking history. She has never used smokeless tobacco. Social: Ms. Kerestes  reports that she has been smoking cigarettes. She has a 66 pack-year smoking history. She has never used smokeless tobacco. She reports current drug use. She reports that she does not drink alcohol. Medical:  has a past medical history of Acute postoperative pain (10/03/2017), Anxiety, BiPAP (biphasic positive airway pressure) dependence, Carpal tunnel syndrome, CHF (congestive heart failure) (HCC), CHF (congestive heart failure) (HCC), Chronic generalized abdominal pain, Chronic rhinitis, COPD (chronic obstructive pulmonary disease) (HCC), DDD (degenerative disc disease), cervical, DDD (degenerative disc disease), lumbosacral, Depression, Dyspnea, Edema, Flu, Gastritis, GERD (gastroesophageal reflux disease), Hematuria, Hernia of abdominal wall (07/17/2018), Hernia, abdominal, Hypertension, Kidney stones, Low back pain, Lumbar radiculitis, Malodorous urine, Muscle weakness, Obesity, Oxygen  dependent, Pneumonia due to COVID-19 virus (02/03/2020), Renal cyst, Sensory urge incontinence, Thyroid activity decreased, Tobacco abuse, and Wheezing. Surgical: Ms. Barrois  has a past surgical history  that includes Abdominal hysterectomy; Tonsillectomy; Carpal tunnel release (Left, 2012); Tubal ligation; epigastric hernia repair (N/A, 08/13/2018); Umbilical hernia repair (N/A, 08/13/2018); RIGHT/LEFT HEART CATH AND CORONARY ANGIOGRAPHY (N/A, 07/11/2019); TEE without cardioversion (N/A, 04/29/2022); Cardioversion (N/A, 04/29/2022); Esophagogastroduodenoscopy (N/A, 06/21/2022); and cataract surgery (Bilateral). Family: family history includes Alcohol abuse in her father; Breast cancer in her sister; Cancer in her brother, brother, and sister; Coronary artery disease in her mother; Heart disease in her father and mother; Kidney cancer in her brother; Lung cancer in her sister; Pneumonia in her brother; Stroke in her mother.  Laboratory Chemistry Profile   Renal Lab Results  Component Value Date   BUN 12 11/08/2022   CREATININE 0.71 11/08/2022   BCR 32 (H) 04/14/2022   GFRAA >60 06/26/2020   GFRNONAA >60 11/08/2022    Hepatic Lab Results  Component Value Date   AST 23 11/05/2022   ALT 18 11/05/2022   ALBUMIN 3.5 11/05/2022   ALKPHOS 82 11/05/2022   LIPASE 57 (H) 06/19/2022   AMMONIA 40 (H) 10/11/2019    Electrolytes Lab Results  Component Value Date   NA 139 11/08/2022   K 3.9 11/08/2022   CL 102 11/08/2022   CALCIUM 8.6 (L) 11/08/2022   MG 2.8 (H) 11/08/2022   PHOS 3.2 06/21/2022    Bone Lab Results  Component Value Date   25OHVITD1 13 (L) 09/17/2015   25OHVITD2 <1.0 09/17/2015   25OHVITD3 13 09/17/2015    Inflammation (CRP: Acute Phase) (ESR: Chronic Phase) Lab Results  Component Value Date   CRP 0.9 04/02/2022   ESRSEDRATE 17 04/06/2022   LATICACIDVEN 0.9 04/01/2022         Note: Above Lab results reviewed.  Recent Imaging Review  DG PAIN CLINIC C-ARM 1-60 MIN NO REPORT Fluoro was used, but no Radiologist interpretation will be provided.  Please refer to "NOTES" tab for provider progress note. Note: Reviewed        Physical Exam  General appearance: Well  nourished, well developed, and well hydrated. In no apparent acute distress Mental status: Alert, oriented x 3 (person, place, & time)       Respiratory: No evidence of acute respiratory distress Eyes: PERLA Vitals: BP 123/68   Pulse 83   Temp 98.1 F (36.7 C) (Temporal)   Ht 5\' 2"  (1.575 m)   Wt 207 lb (93.9 kg)   SpO2 92% Comment: on 6l/[place]  BMI 37.86 kg/m  BMI: Estimated body mass index is 37.86 kg/m as calculated from the following:   Height as of this encounter: 5\' 2"  (1.575 m).   Weight as of this encounter: 207 lb (93.9 kg). Ideal: Ideal body weight: 50.1 kg (110 lb 7.2 oz) Adjusted ideal body weight: 67.6 kg (149 lb 1.1 oz)  Assessment   Diagnosis Status  1. Chronic lower extremity pain (2ry area of Pain) (Left)   2. Lumbar disc extrusion (L2-3) (Left)   3. Chronic left-sided lumbar radiculopathy   4. Chronic low back pain (1ry area of Pain) (Bilateral) (R>L) w/o sciatica   5. Chronic lumbar radicular pain (L4 & S1 Dermatome) (Left)   6. Chronic hip pain (3ry area of Pain) (Bilateral) (L>R)   7. Chronic knee pain (Left)   8. Postop check    Controlled Controlled Controlled   Updated Problems: No problems updated.  Plan of Care  Problem-specific:  No problem-specific Assessment & Plan notes found for this encounter.  Ms. MEKYLA TOMKIEWICZ has a current medication list which includes the following long-term medication(s): albuterol, amiodarone, ferrous sulfate, hydrocodone-acetaminophen, [START ON 05/31/2023] hydrocodone-acetaminophen, [START ON 06/30/2023] hydrocodone-acetaminophen, ipratropium-albuterol, potassium chloride sa, sertraline, spironolactone, and pregabalin.  Pharmacotherapy (Medications Ordered): No orders of the defined types were placed in this encounter.  Orders:  Orders Placed This Encounter  Procedures   Lumbar Epidural Injection    Standing Status:   Future    Standing Expiration Date:   08/23/2023    Scheduling Instructions:     Procedure:  Interlaminar Lumbar Epidural Steroid injection (LESI)  L2-3     Laterality: Left-sided     Sedation: Patient's choice.     Timeframe: ASAA    Order Specific Question:   Where will this procedure be performed?    Answer:   [facility]   Nursing Instructions:    Please complete this patient's postprocedure evaluation.    Scheduling Instructions:     Please complete this patient's postprocedure evaluation.   Follow-up plan:   Return for [facility]): (L) L2-3 LESI #2.      Interventional Therapies  Risk Factors  Considerations  Medical Comorbidities:  COPD  Emphysema  Chronic Smoker  O2 dependent  SOB  CHF  Hx. of A-Fib (Cardioverted)  Obesity  CAD   NOTE: NO further Lumbar RFA until BMI <35. Patient taken off of Xarelto after cardioversion.   Planned  Pending:   Therapeutic left L2-3 LESI #1 (05/09/2023)     Under consideration:   Diagnostic bilateral cervical facet MBB #1  Palliative left L2-3 LESI #4  NOTE: NO further Lumbar RFA until BMI <35.   Completed:   Palliative right lumbar facet MBB x7 (09/20/2022) (100/100/100x5days/0)  Palliative left lumbar facet MBB x6 (11/11/2021) (100/100/0/0)  Therapeutic left L2-3 LESI x3 (09/23/2021) (80/80/80)  Diagnostic/palliative left L4 TFESI x1 (12/26/2017) (100/100/50/>50)  Diagnostic/palliative left L4-5 LESI x2 (07/30/2019) (100/100/0/0)  Diagnostic/palliative right L4 TFESI x1 (01/25/2018) (100/100/50/50)  Diagnostic/palliative right L4-5 LESI x1 (01/25/2018) (100/100/50/50)  Palliative left lumbar facet RFA x1 (06/27/2017) (0/0/100/>50)  Palliative right lumbar facet RFA x1 (10/03/2017) (100/100/75)    Therapeutic  Palliative (PRN) options:   Evaluation needed before each determination.   Completed by other providers:      Pharmacotherapy  Nonopioids transferred 08/03/2020: Gabapentin and baclofen Note: Patient taken off of Xarelto after cardioversion.       Recent Visits Date Type Provider Dept   05/23/23 Office Visit Delano Metz, MD [facility]  05/09/23 Procedure visit Delano Metz, MD [facility]  05/01/23 Office Visit Delano Metz, MD [facility]  03/28/23 Office Visit Delano Metz, MD [facility]  Showing recent visits within past 90 days and meeting all other requirements Future Appointments Date Type Provider Dept  07/26/23 Appointment Delano Metz, MD [facility]  Showing future appointments within next 90 days and meeting all other requirements  I discussed the assessment and treatment plan with the patient. The patient was provided an opportunity to ask questions and all were answered. The patient agreed with the plan and demonstrated an understanding of the instructions.  Patient advised to call back or seek an in-person evaluation if the symptoms or condition worsens.  Duration of encounter: 30 minutes.  Total time on encounter, as per AMA guidelines included both the face-to-face and non-face-to-face time personally spent by the physician and/or other qualified health care professional(s) on the day  of the encounter (includes time in activities that require the physician or other qualified health care professional and does not include time in activities normally performed by clinical staff). Physician's time may include the following activities when performed: Preparing to see the patient (e.g., pre-charting review of records, searching for previously ordered imaging, lab work, and nerve conduction tests) Review of prior analgesic pharmacotherapies. Reviewing PMP Interpreting ordered tests (e.g., lab work, imaging, nerve conduction tests) Performing post-procedure evaluations, including interpretation of diagnostic procedures Obtaining and/or reviewing separately obtained history Performing a medically appropriate examination and/or evaluation Counseling and educating the  patient/family/caregiver Ordering medications, tests, or procedures Referring and communicating with other health care professionals (when not separately reported) Documenting clinical information in the electronic or other health record Independently interpreting results (not separately reported) and communicating results to the patient/ family/caregiver Care coordination (not separately reported)  Note by: Oswaldo Done, MD Date: 05/23/2023; Time: 1:43 PM

## 2023-05-23 ENCOUNTER — Ambulatory Visit: Payer: Medicare HMO | Attending: Pain Medicine | Admitting: Pain Medicine

## 2023-05-23 ENCOUNTER — Encounter: Payer: Self-pay | Admitting: Pain Medicine

## 2023-05-23 VITALS — BP 123/68 | HR 83 | Temp 98.1°F | Ht 62.0 in | Wt 207.0 lb

## 2023-05-23 DIAGNOSIS — M25562 Pain in left knee: Secondary | ICD-10-CM

## 2023-05-23 DIAGNOSIS — M25551 Pain in right hip: Secondary | ICD-10-CM | POA: Diagnosis not present

## 2023-05-23 DIAGNOSIS — J9610 Chronic respiratory failure, unspecified whether with hypoxia or hypercapnia: Secondary | ICD-10-CM | POA: Diagnosis not present

## 2023-05-23 DIAGNOSIS — G8929 Other chronic pain: Secondary | ICD-10-CM | POA: Insufficient documentation

## 2023-05-23 DIAGNOSIS — M545 Low back pain, unspecified: Secondary | ICD-10-CM | POA: Diagnosis not present

## 2023-05-23 DIAGNOSIS — Z09 Encounter for follow-up examination after completed treatment for conditions other than malignant neoplasm: Secondary | ICD-10-CM | POA: Insufficient documentation

## 2023-05-23 DIAGNOSIS — M5416 Radiculopathy, lumbar region: Secondary | ICD-10-CM | POA: Diagnosis not present

## 2023-05-23 DIAGNOSIS — M5126 Other intervertebral disc displacement, lumbar region: Secondary | ICD-10-CM

## 2023-05-23 DIAGNOSIS — M79605 Pain in left leg: Secondary | ICD-10-CM | POA: Diagnosis not present

## 2023-05-23 DIAGNOSIS — M25552 Pain in left hip: Secondary | ICD-10-CM | POA: Insufficient documentation

## 2023-05-23 NOTE — Progress Notes (Unsigned)
Safety precautions to be maintained throughout the outpatient stay will include: orient to surroundings, keep bed in low position, maintain call bell within reach at all times, provide assistance with transfer out of bed and ambulation.  

## 2023-05-23 NOTE — Patient Instructions (Signed)
______________________________________________________________________  Procedure instructions  Do not eat or drink fluids (other than water) for 6 hours before your procedure  No water for 2 hours before your procedure  Take your blood pressure medicine with a sip of water  Arrive 30 minutes before your appointment  Carefully read the "Preparing for your procedure" detailed instructions  If you have questions call us at [phone]  _____________________________________________________________________    ______________________________________________________________________  Preparing for your procedure  Appointments: If you think you may not be able to keep your appointment, call 24-48 hours in advance to cancel. We need time to make it available to others.  During your procedure appointment there will be: No Prescription Refills. No disability issues to discussed. No medication changes or discussions.  Instructions: Food intake: Avoid eating anything solid for at least 8 hours prior to your procedure. Clear liquid intake: You may take clear liquids such as water up to 2 hours prior to your procedure. (No carbonated drinks. No soda.) Transportation: Unless otherwise stated by your physician, bring a driver. Morning Medicines: Except for blood thinners, take all of your other morning medications with a sip of water. Make sure to take your heart and blood pressure medicines. If your blood pressure's lower number is above 100, the case will be rescheduled. Blood thinners: Make sure to stop your blood thinners as instructed.  If you take a blood thinner, but were not instructed to stop it, call our office [phone] and ask to talk to a nurse. Not stopping a blood thinner prior to certain procedures could lead to serious complications. Diabetics on insulin: Notify the staff so that you can be scheduled 1st case in the morning. If your diabetes requires high dose insulin,  take only  of your normal insulin dose the morning of the procedure and notify the staff that you have done so. Preventing infections: Shower with an antibacterial soap the morning of your procedure.  Build-up your immune system: Take 1000 mg of Vitamin C with every meal (3 times a day) the day prior to your procedure. Antibiotics: Inform the nursing staff if you are taking any antibiotics or if you have any conditions that may require antibiotics prior to procedures. (Example: recent joint implants)   Pregnancy: If you are pregnant make sure to notify the nursing staff. Not doing so may result in injury to the fetus, including death.  Sickness: If you have a cold, fever, or any active infections, call and cancel or reschedule your procedure. Receiving steroids while having an infection may result in complications. Arrival: You must be in the facility at least 30 minutes prior to your scheduled procedure. Tardiness: Your scheduled time is also the cutoff time. If you do not arrive at least 15 minutes prior to your procedure, you will be rescheduled.  Children: Do not bring any children with you. Make arrangements to keep them home. Dress appropriately: There is always a possibility that your clothing may get soiled. Avoid long dresses. Valuables: Do not bring any jewelry or valuables.  Reasons to call and reschedule or cancel your procedure: (Following these recommendations will minimize the risk of a serious complication.) Surgeries: Avoid having procedures within 2 weeks of any surgery. (Avoid for 2 weeks before or after any surgery). Flu Shots: Avoid having procedures within 2 weeks of a flu shots or . (Avoid for 2 weeks before or after immunizations). Barium: Avoid having a procedure within 7-10 days after having had a radiological study involving the use of   radiological contrast. (Myelograms, Barium swallow or enema study). Heart attacks: Avoid any elective procedures or surgeries for the  initial 6 months after a "Myocardial Infarction" (Heart Attack). Blood thinners: It is imperative that you stop these medications before procedures. Let us know if you if you take any blood thinner.  Infection: Avoid procedures during or within two weeks of an infection (including chest colds or gastrointestinal problems). Symptoms associated with infections include: Localized redness, fever, chills, night sweats or profuse sweating, burning sensation when voiding, cough, congestion, stuffiness, runny nose, sore throat, diarrhea, nausea, vomiting, cold or Flu symptoms, recent or current infections. It is specially important if the infection is over the area that we intend to treat. Heart and lung problems: Symptoms that may suggest an active cardiopulmonary problem include: cough, chest pain, breathing difficulties or shortness of breath, dizziness, ankle swelling, uncontrolled high or unusually low blood pressure, and/or palpitations. If you are experiencing any of these symptoms, cancel your procedure and contact your primary care physician for an evaluation.  Remember:  Regular Business hours are:  Monday to Thursday 8:00 AM to 4:00 PM  Provider's Schedule:  , MD:  Procedure days: Tuesday and Thursday 7:30 AM to 4:00 PM  Bilal Lateef, MD:  Procedure days: Monday and Wednesday 7:30 AM to 4:00 PM  ______________________________________________________________________    ____________________________________________________________________________________________  General Risks and Possible Complications  Patient Responsibilities: It is important that you read this as it is part of your informed consent. It is our duty to inform you of the risks and possible complications associated with treatments offered to you. It is your responsibility as a patient to read this and to ask questions about anything that is not clear or that you believe was not covered in this  document.  Patient's Rights: You have the right to refuse treatment. You also have the right to change your mind, even after initially having agreed to have the treatment done. However, under this last option, if you wait until the last second to change your mind, you may be charged for the materials used up to that point.  Introduction: Medicine is not an exact science. Everything in Medicine, including the lack of treatment(s), carries the potential for danger, harm, or loss (which is by definition: Risk). In Medicine, a complication is a secondary problem, condition, or disease that can aggravate an already existing one. All treatments carry the risk of possible complications. The fact that a side effects or complications occurs, does not imply that the treatment was conducted incorrectly. It must be clearly understood that these can happen even when everything is done following the highest safety standards.  No treatment: You can choose not to proceed with the proposed treatment alternative. The "PRO(s)" would include: avoiding the risk of complications associated with the therapy. The "CON(s)" would include: not getting any of the treatment benefits. These benefits fall under one of three categories: diagnostic; therapeutic; and/or palliative. Diagnostic benefits include: getting information which can ultimately lead to improvement of the disease or symptom(s). Therapeutic benefits are those associated with the successful treatment of the disease. Finally, palliative benefits are those related to the decrease of the primary symptoms, without necessarily curing the condition (example: decreasing the pain from a flare-up of a chronic condition, such as incurable terminal cancer).  General Risks and Complications: These are associated to most interventional treatments. They can occur alone, or in combination. They fall under one of the following six (6) categories: no benefit or worsening of symptoms;    bleeding; infection; nerve damage; allergic reactions; and/or death. No benefits or worsening of symptoms: In Medicine there are no guarantees, only probabilities. No healthcare provider can ever guarantee that a medical treatment will work, they can only state the probability that it may. Furthermore, there is always the possibility that the condition may worsen, either directly, or indirectly, as a consequence of the treatment. Bleeding: This is more common if the patient is taking a blood thinner, either prescription or over the counter (example: Goody Powders, Fish oil, Aspirin, Garlic, etc.), or if suffering a condition associated with impaired coagulation (example: [name], cirrhosis of the liver, low platelet counts, etc.). However, even if you do not have one on these, it can still happen. If you have any of these conditions, or take one of these drugs, make sure to notify your treating physician. Infection: This is more common in patients with a compromised immune system, either due to disease (example: diabetes, cancer, human immunodeficiency virus [HIV], etc.), or due to medications or treatments (example: therapies used to treat cancer and rheumatological diseases). However, even if you do not have one on these, it can still happen. If you have any of these conditions, or take one of these drugs, make sure to notify your treating physician. Nerve Damage: This is more common when the treatment is an invasive one, but it can also happen with the use of medications, such as those used in the treatment of cancer. The damage can occur to small secondary nerves, or to large primary ones, such as those in the spinal cord and brain. This damage may be temporary or permanent and it may lead to impairments that can range from temporary numbness to permanent paralysis and/or brain death. Allergic Reactions: Any time a substance or material comes in contact with our body, there is the possibility of an  allergic reaction. These can range from a mild skin rash (contact dermatitis) to a severe systemic reaction (anaphylactic reaction), which can result in death. Death: In general, any medical intervention can result in death, most of the time due to an unforeseen complication. ____________________________________________________________________________________________    

## 2023-05-29 ENCOUNTER — Telehealth: Payer: Self-pay

## 2023-05-29 NOTE — Telephone Encounter (Signed)
Her insurance cannot approve the [facility] without documentation regarding PT. You can explain why the patient is unable to do PT. I just need notes in the chart giving the reasons why she cannot do PT and they may approve it.

## 2023-06-05 ENCOUNTER — Ambulatory Visit: Payer: Medicare HMO | Attending: Family | Admitting: Family

## 2023-06-05 ENCOUNTER — Encounter: Payer: Self-pay | Admitting: Family

## 2023-06-05 VITALS — BP 129/72 | HR 88 | Ht 62.0 in | Wt 215.0 lb

## 2023-06-05 DIAGNOSIS — M48062 Spinal stenosis, lumbar region with neurogenic claudication: Secondary | ICD-10-CM | POA: Diagnosis not present

## 2023-06-05 DIAGNOSIS — Z8616 Personal history of COVID-19: Secondary | ICD-10-CM | POA: Insufficient documentation

## 2023-06-05 DIAGNOSIS — I428 Other cardiomyopathies: Secondary | ICD-10-CM | POA: Diagnosis not present

## 2023-06-05 DIAGNOSIS — R937 Abnormal findings on diagnostic imaging of other parts of musculoskeletal system: Secondary | ICD-10-CM | POA: Insufficient documentation

## 2023-06-05 DIAGNOSIS — M5416 Radiculopathy, lumbar region: Secondary | ICD-10-CM | POA: Insufficient documentation

## 2023-06-05 DIAGNOSIS — Z7984 Long term (current) use of oral hypoglycemic drugs: Secondary | ICD-10-CM | POA: Insufficient documentation

## 2023-06-05 DIAGNOSIS — M48061 Spinal stenosis, lumbar region without neurogenic claudication: Secondary | ICD-10-CM | POA: Insufficient documentation

## 2023-06-05 DIAGNOSIS — M4310 Spondylolisthesis, site unspecified: Secondary | ICD-10-CM | POA: Insufficient documentation

## 2023-06-05 DIAGNOSIS — Z79899 Other long term (current) drug therapy: Secondary | ICD-10-CM | POA: Insufficient documentation

## 2023-06-05 DIAGNOSIS — J449 Chronic obstructive pulmonary disease, unspecified: Secondary | ICD-10-CM | POA: Diagnosis not present

## 2023-06-05 DIAGNOSIS — M5126 Other intervertebral disc displacement, lumbar region: Secondary | ICD-10-CM | POA: Insufficient documentation

## 2023-06-05 DIAGNOSIS — I4892 Unspecified atrial flutter: Secondary | ICD-10-CM | POA: Diagnosis not present

## 2023-06-05 DIAGNOSIS — M4316 Spondylolisthesis, lumbar region: Secondary | ICD-10-CM | POA: Diagnosis not present

## 2023-06-05 DIAGNOSIS — Z716 Tobacco abuse counseling: Secondary | ICD-10-CM | POA: Insufficient documentation

## 2023-06-05 DIAGNOSIS — Z720 Tobacco use: Secondary | ICD-10-CM | POA: Diagnosis not present

## 2023-06-05 DIAGNOSIS — M79605 Pain in left leg: Secondary | ICD-10-CM | POA: Insufficient documentation

## 2023-06-05 DIAGNOSIS — I5032 Chronic diastolic (congestive) heart failure: Secondary | ICD-10-CM | POA: Insufficient documentation

## 2023-06-05 DIAGNOSIS — G8929 Other chronic pain: Secondary | ICD-10-CM | POA: Insufficient documentation

## 2023-06-05 DIAGNOSIS — F17210 Nicotine dependence, cigarettes, uncomplicated: Secondary | ICD-10-CM | POA: Insufficient documentation

## 2023-06-05 DIAGNOSIS — I110 Hypertensive heart disease with heart failure: Secondary | ICD-10-CM | POA: Insufficient documentation

## 2023-06-05 DIAGNOSIS — I10 Essential (primary) hypertension: Secondary | ICD-10-CM | POA: Diagnosis not present

## 2023-06-05 DIAGNOSIS — M5136 Other intervertebral disc degeneration, lumbar region: Secondary | ICD-10-CM | POA: Insufficient documentation

## 2023-06-05 DIAGNOSIS — F32A Depression, unspecified: Secondary | ICD-10-CM | POA: Diagnosis not present

## 2023-06-05 DIAGNOSIS — M47816 Spondylosis without myelopathy or radiculopathy, lumbar region: Secondary | ICD-10-CM | POA: Diagnosis not present

## 2023-06-05 DIAGNOSIS — I272 Pulmonary hypertension, unspecified: Secondary | ICD-10-CM | POA: Diagnosis not present

## 2023-06-05 DIAGNOSIS — R0602 Shortness of breath: Secondary | ICD-10-CM | POA: Diagnosis not present

## 2023-06-05 DIAGNOSIS — F419 Anxiety disorder, unspecified: Secondary | ICD-10-CM | POA: Diagnosis not present

## 2023-06-05 DIAGNOSIS — Z9981 Dependence on supplemental oxygen: Secondary | ICD-10-CM | POA: Insufficient documentation

## 2023-06-05 NOTE — Progress Notes (Unsigned)
PROVIDER NOTE: Interpretation of information contained herein should be left to medically-trained personnel. Specific patient instructions are provided elsewhere under "Patient Instructions" section of medical record. This document was created in part using STT-dictation technology, any transcriptional errors that may result from this process are unintentional.  Patient: Theresa [name] Type: Established DOB: 1956-06-12 MRN: [id number] PCP: Dorothey Baseman, MD  Service: Procedure DOS: 06/06/2023 Setting: Ambulatory Location: Ambulatory outpatient facility Delivery: Face-to-face Provider: Oswaldo Done, MD Specialty: Interventional Pain Management Specialty designation: 09 Location: Outpatient facility Ref. Prov.: Delano Metz, MD       Interventional Therapy   Procedure: Lumbar epidural steroid injection (LESI) (interlaminar) #2    Laterality: Left   Level:  L2-3 Level.  Imaging: Fluoroscopic guidance         Anesthesia: Local anesthesia (1-2% Lidocaine) Anxiolysis: IV Versed 2.0 mg Sedation: No Sedation                       DOS: 06/06/2023  Performed by: Oswaldo Done, MD  Purpose: Diagnostic/Therapeutic Indications: Lumbar radicular pain of intraspinal etiology of more than 4 weeks that has failed to respond to conservative therapy and is severe enough to impact quality of life or function. 1. Chronic left-sided lumbar radiculopathy   2. Chronic lower extremity pain (2ry area of Pain) (Left)   3. Chronic lumbar radicular pain (L4 & S1 Dermatome) (Left)   4. DDD (degenerative disc disease), lumbar   5. Grade 1 Anterolisthesis of lumbar spine (4mm) (L3/L4)   6. Grade 1 Retrolisthesis (2mm) (L5/S1)   7. Lumbar disc extrusion (L2-3) (Left)   8. 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)   9. 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)   10. Spinal stenosis of lumbar region with neurogenic claudication   11. Lumbar spondylosis (L4-5)   12. Neuritis  or radiculitis due to rupture of lumbar intervertebral disc   13. Abnormal MRI, lumbar spine (12/09/2021)    NAS-11 Pain score:   Pre-procedure: 8 /10   Post-procedure: 0-No pain/10      Position / Prep / Materials:  Position: Prone w/ head of the table raised (slight reverse trendelenburg) to facilitate breathing.  Prep solution: DuraPrep (Iodine Povacrylex [0.7% available iodine] and Isopropyl Alcohol, 74% w/w) Prep Area: Entire Posterior Lumbar Region from lower scapular tip down to mid buttocks area and from flank to flank. Materials:  Tray: Epidural tray Needle(s):  Type: Epidural needle (Tuohy) Gauge (G):  17 Length: Regular (3.5-in) Qty: 1   H&P (Pre-op Assessment):  [name] is a 67 y.o. (year old), female patient, seen today for interventional treatment. She  has a past surgical history that includes Abdominal hysterectomy; Tonsillectomy; Carpal tunnel release (Left, 2012); Tubal ligation; epigastric hernia repair (N/A, 08/13/2018); Umbilical hernia repair (N/A, 08/13/2018); RIGHT/LEFT HEART CATH AND CORONARY ANGIOGRAPHY (N/A, 07/11/2019); TEE without cardioversion (N/A, 04/29/2022); Cardioversion (N/A, 04/29/2022); Esophagogastroduodenoscopy (N/A, 06/21/2022); and cataract surgery (Bilateral). [name] has a current medication list which includes the following prescription(s): acetaminophen, albuterol, amiodarone, buspirone, butalbital-acetaminophen-caffeine, cholecalciferol, diclofenac sodium, diltiazem, farxiga, ferrous sulfate, fluticasone-umeclidin-vilant, furoscix, hydrocodone-acetaminophen, [START ON 06/30/2023] hydrocodone-acetaminophen, ipratropium-albuterol, melatonin, naloxone, omeprazole, polyethylene glycol, potassium chloride sa, roflumilast, wegovy, sertraline, simvastatin, spironolactone, torsemide, hydrocodone-acetaminophen, and pregabalin. Her primarily concern today is the Back Pain (lower)  Initial Vital Signs:  Pulse/HCG Rate: 76ECG Heart Rate: 81  (nsr) Temp: 98.2 F (36.8 C) Resp: 18 BP: (!) 119/97 SpO2: 96 % (O2 at 6 liters BNC)  BMI: Estimated body mass index is 39.14  kg/m as calculated from the following:   Height as of this encounter: 5\' 2"  (1.575 m).   Weight as of this encounter: 214 lb (97.1 kg).  Risk Assessment: Allergies: Reviewed. She is allergic to gabapentin, codeine, and meloxicam.  Allergy Precautions: None required Coagulopathies: Reviewed. None identified.  Blood-thinner therapy: None at this time Active Infection(s): Reviewed. None identified. [name] is afebrile  Site Confirmation: [name] was asked to confirm the procedure and laterality before marking the site Procedure checklist: Completed Consent: Before the procedure and under the influence of no sedative(s), amnesic(s), or anxiolytics, the patient was informed of the treatment options, risks and possible complications. To fulfill our ethical and legal obligations, as recommended by the American Medical Association's Code of Ethics, I have informed the patient of my clinical impression; the nature and purpose of the treatment or procedure; the risks, benefits, and possible complications of the intervention; the alternatives, including doing nothing; the risk(s) and benefit(s) of the alternative treatment(s) or procedure(s); and the risk(s) and benefit(s) of doing nothing. The patient was provided information about the general risks and possible complications associated with the procedure. These may include, but are not limited to: failure to achieve desired goals, infection, bleeding, organ or nerve damage, allergic reactions, paralysis, and death. In addition, the patient was informed of those risks and complications associated to Spine-related procedures, such as failure to decrease pain; infection (i.e.: Meningitis, epidural or intraspinal abscess); bleeding (i.e.: epidural hematoma, subarachnoid hemorrhage, or any other type of intraspinal or  peri-dural bleeding); organ or nerve damage (i.e.: Any type of peripheral nerve, nerve root, or spinal cord injury) with subsequent damage to sensory, motor, and/or autonomic systems, resulting in permanent pain, numbness, and/or weakness of one or several areas of the body; allergic reactions; (i.e.: anaphylactic reaction); and/or death. Furthermore, the patient was informed of those risks and complications associated with the medications. These include, but are not limited to: allergic reactions (i.e.: anaphylactic or anaphylactoid reaction(s)); adrenal axis suppression; blood sugar elevation that in diabetics may result in ketoacidosis or comma; water retention that in patients with history of congestive heart failure may result in shortness of breath, pulmonary edema, and decompensation with resultant heart failure; weight gain; swelling or edema; medication-induced neural toxicity; particulate matter embolism and blood vessel occlusion with resultant organ, and/or nervous system infarction; and/or aseptic necrosis of one or more joints. Finally, the patient was informed that Medicine is not an exact science; therefore, there is also the possibility of unforeseen or unpredictable risks and/or possible complications that may result in a catastrophic outcome. The patient indicated having understood very clearly. We have given the patient no guarantees and we have made no promises. Enough time was given to the patient to ask questions, all of which were answered to the patient's satisfaction. Ms. Cash has indicated that she wanted to continue with the procedure. Attestation: I, the ordering provider, attest that I have discussed with the patient the benefits, risks, side-effects, alternatives, likelihood of achieving goals, and potential problems during recovery for the procedure that I have provided informed consent. Date  Time: 06/06/2023 11:14 AM   Pre-Procedure Preparation:  Monitoring: As per clinic  protocol. Respiration, ETCO2, SpO2, BP, heart rate and rhythm monitor placed and checked for adequate function Safety Precautions: Patient was assessed for positional comfort and pressure points before starting the procedure. Time-out: I initiated and conducted the "Time-out" before starting the procedure, as per protocol. The patient was asked to participate by confirming the accuracy of  the "Time Out" information. Verification of the correct person, site, and procedure were performed and confirmed by me, the nursing staff, and the patient. "Time-out" conducted as per Joint Commission's Universal Protocol (UP.01.01.01). Time: 1200 Start Time: 1200 hrs.  Description/Narrative of Procedure:          Target: Epidural space via interlaminar opening, initially targeting the lower laminar border of the superior vertebral body. Region: Lumbar Approach: Percutaneous paravertebral  Rationale (medical necessity): procedure needed and proper for the diagnosis and/or treatment of the patient's medical symptoms and needs. Procedural Technique Safety Precautions: Aspiration looking for blood return was conducted prior to all injections. At no point did we inject any substances, as a needle was being advanced. No attempts were made at seeking any paresthesias. Safe injection practices and needle disposal techniques used. Medications properly checked for expiration dates. SDV (single dose vial) medications used. Description of the Procedure: Protocol guidelines were followed. The procedure needle was introduced through the skin, ipsilateral to the reported pain, and advanced to the target area. Bone was contacted and the needle walked caudad, until the lamina was cleared. The epidural space was identified using "loss-of-resistance technique" with 2-3 ml of PF-NaCl (0.9% NSS), in a 5cc LOR glass syringe.  Vitals:   06/06/23 1156 06/06/23 1200 06/06/23 1204 06/06/23 1211  BP: (!) 116/99 129/79 131/77 131/76  Pulse:     77  Resp: 17 17 16 16   Temp:      TempSrc:      SpO2: 100% 100% 100% 98%  Weight:      Height:        Start Time: 1200 hrs. End Time: 1204 hrs.  Imaging Guidance (Spinal):          Type of Imaging Technique: Fluoroscopy Guidance (Spinal) Indication(s): Assistance in needle guidance and placement for procedures requiring needle placement in or near specific anatomical locations not easily accessible without such assistance. Exposure Time: Please see nurses notes. Contrast: Before injecting any contrast, we confirmed that the patient did not have an allergy to iodine, shellfish, or radiological contrast. Once satisfactory needle placement was completed at the desired level, radiological contrast was injected. Contrast injected under live fluoroscopy. No contrast complications. See chart for type and volume of contrast used. Fluoroscopic Guidance: I was personally present during the use of fluoroscopy. "Tunnel Vision Technique" used to obtain the best possible view of the target area. Parallax error corrected before commencing the procedure. "Direction-depth-direction" technique used to introduce the needle under continuous pulsed fluoroscopy. Once target was reached, antero-posterior, oblique, and lateral fluoroscopic projection used confirm needle placement in all planes. Images permanently stored in EMR. Interpretation: I personally interpreted the imaging intraoperatively. Adequate needle placement confirmed in multiple planes. Appropriate spread of contrast into desired area was observed. No evidence of afferent or efferent intravascular uptake. No intrathecal or subarachnoid spread observed. Permanent images saved into the patient's record.  Antibiotic Prophylaxis:   Anti-infectives (From admission, onward)    None      Indication(s): None identified  Post-operative Assessment:  Post-procedure Vital Signs:  Pulse/HCG Rate: 7775 Temp: 98.2 F (36.8 C) Resp: 16 BP:  131/76 SpO2: 98 %  EBL: None  Complications: No immediate post-treatment complications observed by team, or reported by patient.  Note: The patient tolerated the entire procedure well. A repeat set of vitals were taken after the procedure and the patient was kept under observation following institutional policy, for this type of procedure. Post-procedural neurological assessment was performed, showing return to baseline,  prior to discharge. The patient was provided with post-procedure discharge instructions, including a section on how to identify potential problems. Should any problems arise concerning this procedure, the patient was given instructions to immediately contact us, at any time, without hesitation. In any case, we plan to contact the patient by telephone for a follow-up status report regarding this interventional procedure.  Comments:  No additional relevant information.  Plan of Care (POC)  Orders:  Orders Placed This Encounter  Procedures   Lumbar Epidural Injection    Scheduling Instructions:     Procedure: Interlaminar LESI L2-3     Laterality: Left     Sedation: Patient's choice     Timeframe: Today    Order Specific Question:   Where will this procedure be performed?    Answer:   [facility]   DG [facility] C-ARM 1-60 MIN NO REPORT    Intraoperative interpretation by procedural physician at [facility].    Standing Status:   Standing    Number of Occurrences:   1    Order Specific Question:   Reason for exam:    Answer:   Assistance in needle guidance and placement for procedures requiring needle placement in or near specific anatomical locations not easily accessible without such assistance.   Informed Consent Details: Physician/Practitioner Attestation; Transcribe to consent form and obtain patient signature    Note: Always confirm laterality of pain with Ms. Chandra Batch, before procedure. Transcribe to consent form and obtain patient signature.     Order Specific Question:   Physician/Practitioner attestation of informed consent for procedure/surgical case    Answer:   I, the physician/practitioner, attest that I have discussed with the patient the benefits, risks, side effects, alternatives, likelihood of achieving goals and potential problems during recovery for the procedure that I have provided informed consent.    Order Specific Question:   Procedure    Answer:   Lumbar epidural steroid injection under fluoroscopic guidance    Order Specific Question:   Physician/Practitioner performing the procedure    Answer:   Jenie Parish A. Laban Emperor, MD    Order Specific Question:   Indication/Reason    Answer:   Low back and/or lower extremity pain secondary to lumbar radiculitis   Provide equipment / supplies at bedside    Procedural tray: Epidural Tray (Disposable  single use) Skin infiltration needle: Regular 1.5-in, 25-G, (x1) Block needle size: Regular standard Catheter: No catheter required    Standing Status:   Standing    Number of Occurrences:   1    Order Specific Question:   Specify    Answer:   Epidural Tray   Chronic Opioid Analgesic:  Hydrocodone/APAP 5/325 one tablet every 8 hours (15 mg/day of hydrocodone) MME/day: 15 mg/day.   Medications ordered for procedure: Meds ordered this encounter  Medications   iohexol (OMNIPAQUE) 180 MG/ML injection 10 mL    Must be Myelogram-compatible. If not available, you may substitute with a water-soluble, non-ionic, hypoallergenic, myelogram-compatible radiological contrast medium.   lidocaine (XYLOCAINE) 2 % (with pres) injection 400 mg   pentafluoroprop-tetrafluoroeth (GEBAUERS) aerosol   lactated ringers infusion   midazolam (VERSED) injection 0.5-2 mg    Make sure Flumazenil is available in the pyxis when using this medication. If oversedation occurs, administer 0.2 mg IV over 15 sec. If after 45 sec no response, administer 0.2 mg again over 1 min; may repeat at 1 min intervals; not  to exceed 4 doses (1 mg)   sodium chloride  flush (NS) 0.9 % injection 2 mL   ropivacaine (PF) 2 mg/mL (0.2%) (NAROPIN) injection 2 mL   triamcinolone acetonide (KENALOG-40) injection 40 mg   Medications administered: We administered iohexol, lidocaine, pentafluoroprop-tetrafluoroeth, lactated ringers, midazolam, sodium chloride flush, ropivacaine (PF) 2 mg/mL (0.2%), and triamcinolone acetonide.  See the medical record for exact dosing, route, and time of administration.  Follow-up plan:   Return in about 2 weeks (around 06/20/2023) for (Face2F), (PPE).       Interventional Therapies  Risk Factors  Considerations  Medical Comorbidities:  COPD  Emphysema  Chronic Smoker  O2 dependent  SOB  CHF  Hx. of A-Fib (Cardioverted)  Obesity  CAD   NOTE: NO further Lumbar RFA until BMI <35. Patient taken off of Xarelto after cardioversion.   Planned  Pending:   Therapeutic left L2-3 LESI #1 (05/09/2023)     Under consideration:   Diagnostic bilateral cervical facet MBB #1  Palliative left L2-3 LESI #4  NOTE: NO further Lumbar RFA until BMI <35.   Completed:   Palliative right lumbar facet MBB x7 (09/20/2022) (100/100/100x5days/0)  Palliative left lumbar facet MBB x6 (11/11/2021) (100/100/0/0)  Therapeutic left L2-3 LESI x3 (09/23/2021) (80/80/80)  Diagnostic/palliative left L4 TFESI x1 (12/26/2017) (100/100/50/>50)  Diagnostic/palliative left L4-5 LESI x2 (07/30/2019) (100/100/0/0)  Diagnostic/palliative right L4 TFESI x1 (01/25/2018) (100/100/50/50)  Diagnostic/palliative right L4-5 LESI x1 (01/25/2018) (100/100/50/50)  Palliative left lumbar facet RFA x1 (06/27/2017) (0/0/100/>50)  Palliative right lumbar facet RFA x1 (10/03/2017) (100/100/75)    Therapeutic  Palliative (PRN) options:   Evaluation needed before each determination.   Completed by other providers:      Pharmacotherapy  Nonopioids transferred 08/03/2020: Gabapentin and baclofen Note: Patient taken off of  Xarelto after cardioversion.        Recent Visits Date Type Provider Dept  05/23/23 Office Visit Delano Metz, MD [facility]  05/09/23 Procedure visit Delano Metz, MD [facility]  05/01/23 Office Visit Delano Metz, MD [facility]  03/28/23 Office Visit Delano Metz, MD [facility]  Showing recent visits within past 90 days and meeting all other requirements Today's Visits Date Type Provider Dept  06/06/23 Procedure visit Delano Metz, MD [facility]  Showing today's visits and meeting all other requirements Future Appointments Date Type Provider Dept  06/22/23 Appointment Delano Metz, MD [facility]  07/26/23 Appointment Delano Metz, MD [facility]  Showing future appointments within next 90 days and meeting all other requirements  Disposition: Discharge home  Discharge (Date  Time): 06/06/2023; 1217 hrs.   Primary Care Physician: Dorothey Baseman, MD Location: [facility] Note by: Oswaldo Done, MD (TTS technology used. I apologize for any typographical errors that were not detected and corrected.) Date: 06/06/2023; Time: 1:00 PM  Disclaimer:  Medicine is not an Visual merchandiser. The only guarantee in medicine is that nothing is guaranteed. It is important to note that the decision to proceed with this intervention was based on the information collected from the patient. The Data and conclusions were drawn from the patient's questionnaire, the interview, and the physical examination. Because the information was provided in large part by the patient, it cannot be guaranteed that it has not been purposely or unconsciously manipulated. Every effort has been made to obtain as much relevant data as possible for this evaluation. It is important to note that the conclusions that lead to this procedure are derived in large part from the available  data. Always take  into account that the treatment will also be dependent on availability of resources and existing treatment guidelines, considered by other Pain Management Practitioners as being common knowledge and practice, at the time of the intervention. For Medico-Legal purposes, it is also important to point out that variation in procedural techniques and pharmacological choices are the acceptable norm. The indications, contraindications, technique, and results of the above procedure should only be interpreted and judged by a Board-Certified Interventional Pain Specialist with extensive familiarity and expertise in the same exact procedure and technique.

## 2023-06-05 NOTE — Progress Notes (Signed)
PCP: Dorothey Baseman, MD (last seen 07/24) Primary Cardiologist: Dorothyann Peng, MD (last seen 07/24) HF cardiologist: Arvilla Meres, MD (last seen 12/23)  HPI:   Ms Huth is a 67 y/o female with a history of chronic low back pain, HTN, atrial flutter, pulmonary HTN, depression, COPD on long-term oxygen, anxiety, carpal tunnel syndrome, long-standing tobacco use and chronic heart failure.  Echo 02/19/15: EF of 50% without valvular stenosis.  Echo 11/03/15: but unable to access results.  Echo 07/07/2019: EF of 60-65% along with mild TR and moderately elevated PA pressure. Echo 09/03/20:EF of >55% with mild LAE and severe TR.  Echo 08/08/21: EF of 60-65% along with mild LAE. Echo 04/29/22: EF of 60-65% along with mild MR and no thrombus.    Catheterization done 07/11/2019 showed:  Prox RCA to Dist RCA lesion is 50% stenosed. Mid LAD lesion is 50% stenosed. Ost LM to Dist LM lesion is 10% stenosed. Hemodynamic findings consistent with moderate pulmonary hypertension. Normal overall left ventricular function of at least 60% Moderate coronary disease no significant obstructive disease Moderate pulmonary hypertension  Admitted 11/07/22 due to epistaxis and COPD exacerbation. Admitted 11/05/22 due to epistaxis. Admitted 08/09/22 due to cough and worsening SOB due to COPD exacerbation due to RSV. Hypotension due to overdiuresis. Blood transfusion given due to hemoglobin <7 due to slow GIB. Initially need increased oxygen at 8L but able to be weaned down to baseline 4L. [place] negative for DVT. Discharged after 17 days.   She presents today for a HF follow-up visit with a chief complaint of moderate SOB with minimal exertion. Chronic in nature. Has associated wheezing & fatigue along with this. Denies chest pain, palpitations (except after using nebulizer), abdominal distention, pedal edema, dizziness or weight gain.   Has been using nebulizer treatment more often due to increased heat/ humidity.  Wearing her oxygen at 6L around the clock.   At last visit, spironolactone was decreased to 12.5mg  daily due to hypotension.Reginal Lutes got approved by her insurance but her pharmacy has been unable to get it in stock yet.   ROS: All systems negative except as listed in HPI, PMH and Problem List.  SH:  Social History   Socioeconomic History   Marital status: Married    Spouse name: Harrold Donath   Number of children: 2   Years of education: Not on file   Highest education level: 12th grade  Occupational History   Occupation: disability  Tobacco Use   Smoking status: Some Days    Current packs/day: 1.50    Average packs/day: 1.5 packs/day for 44.0 years (66.0 ttl pk-yrs)    Types: Cigarettes   Smokeless tobacco: Never   Tobacco comments:    over a half pack  Vaping Use   Vaping status: Never Used  Substance and Sexual Activity   Alcohol use: No   Drug use: Yes    Comment: prescribed hydrocodone   Sexual activity: Not Currently    Birth control/protection: Surgical  Other Topics Concern   Not on file  Social History Narrative   ** Merged History Encounter **       Lives with spouse    Social Determinants of Health   Financial Resource Strain: Low Risk  (04/27/2023)   Received from [facility]   Overall Financial Resource Strain (CARDIA)    Difficulty of Paying Living Expenses: Not very hard  Food Insecurity: No Food Insecurity (04/27/2023)   Received from [facility]   Hunger Vital Sign  Worried About Programme researcher, broadcasting/film/video in the Last Year: Never true    Ran Out of Food in the Last Year: Never true  Transportation Needs: No Transportation Needs (04/27/2023)   Received from [facility] - Transportation    In the past 12 months, has lack of transportation kept you from medical appointments or from getting medications?: No    Lack of Transportation (Non-Medical): No  Physical Activity: Inactive (08/24/2022)   Received  from [facility], [facility]   Exercise Vital Sign    Days of Exercise per Week: 0 days    Minutes of Exercise per Session: 0 min  Stress: Stress Concern Present (08/24/2022)   Received from [facility], [facility]   [employer] of Occupational Health - Occupational Stress Questionnaire    Feeling of Stress : Very much  Social Connections: Socially Integrated (08/24/2022)   Received from [facility], [facility]   Social Connection and Isolation Panel [NHANES]    Frequency of Communication with Friends and Family: More than three times a week    Frequency of Social Gatherings with Friends and Family: Never    Attends Religious Services: More than 4 times per year    Active Member of Clubs or Organizations: Yes    Attends Engineer, structural: More than 4 times per year    Marital Status: Married  Catering manager Violence: Not At Risk (11/08/2022)   Humiliation, Afraid, Rape, and Kick questionnaire    Fear of Current or Ex-Partner: No    Emotionally Abused: No    Physically Abused: No    Sexually Abused: No    FH:  Family History  Problem Relation Age of Onset   Heart disease Mother    Stroke Mother    Coronary artery disease Mother    Alcohol abuse Father    Heart disease Father    Lung cancer Sister    Cancer Sister    Breast cancer Sister    Cancer Brother    Cancer Brother    Kidney cancer Brother    Pneumonia Brother    Prostate cancer Neg Hx    Bladder Cancer Neg Hx     Past Medical History:  Diagnosis Date   Acute postoperative pain 10/03/2017   Anxiety    BiPAP (biphasic positive airway pressure) dependence    at hs   Carpal tunnel syndrome    CHF (congestive heart failure) (HCC)    1/18   CHF (congestive heart failure) (HCC)    Chronic generalized abdominal pain    Chronic rhinitis    COPD (chronic obstructive pulmonary disease)  (HCC)    DDD (degenerative disc disease), cervical    DDD (degenerative disc disease), lumbosacral    Depression    Dyspnea    Edema    Flu    1/18   Gastritis    GERD (gastroesophageal reflux disease)    Hematuria    Hernia of abdominal wall 07/17/2018   Hernia, abdominal    Hypertension    Kidney stones    Low back pain    Lumbar radiculitis    Malodorous urine    Muscle weakness    Obesity    Oxygen dependent    Pneumonia due to COVID-19 virus 02/03/2020   Renal cyst    Sensory urge incontinence    Thyroid activity decreased    9/19  Tobacco abuse    Wheezing     Current Outpatient Medications  Medication Sig Dispense Refill   acetaminophen (TYLENOL) 500 MG tablet Take 1,000 mg by mouth every 8 (eight) hours as needed for mild pain or headache.     albuterol (VENTOLIN HFA) 108 (90 Base) MCG/ACT inhaler Inhale 2 puffs into the lungs every 6 (six) hours as needed for wheezing or shortness of breath. 8 g 1   amiodarone (PACERONE) 200 MG tablet Take 1 tablet (200 mg total) by mouth daily. 30 tablet 0   busPIRone (BUSPAR) 5 MG tablet TAKE 1 TABLET BY MOUTH TWICE DAILY 180 tablet 1   butalbital-acetaminophen-caffeine (FIORICET) 50-325-40 MG tablet Take 1 tablet by mouth every 6 (six) hours as needed for headache. LIMIT USE TO PREVENT MEDICATION OVERUSE HEADACHE 14 tablet 0   Cholecalciferol 50 MCG (2000 UT) CAPS Take 2,000 Units by mouth daily.      diclofenac Sodium (VOLTAREN) 1 % GEL Apply 2 g topically 4 (four) times daily as needed (lower back and/or shoulder pain).     diltiazem (CARDIZEM CD) 180 MG 24 hr capsule Take 180 mg by mouth daily.     FARXIGA 10 MG TABS tablet TAKE 1 TABLET BY MOUTH ONCE DAILY BEFOREBREAKFAST 30 tablet 5   ferrous sulfate 325 (65 FE) MG EC tablet Take 1 tablet (325 mg total) by mouth 2 (two) times daily with a meal. 60 tablet 3   Fluticasone-Umeclidin-Vilant 100-62.5-25 MCG/INH AEPB Inhale 1 puff into the lungs daily.     FUROSCIX 80 MG/10ML  CTKT Inject into the skin.     HYDROcodone-acetaminophen (NORCO/VICODIN) 5-325 MG tablet Take 1 tablet by mouth every 8 (eight) hours as needed for severe pain. Must last 30 days 90 tablet 0   HYDROcodone-acetaminophen (NORCO/VICODIN) 5-325 MG tablet Take 1 tablet by mouth every 8 (eight) hours as needed for severe pain. Must last 30 days 90 tablet 0   [START ON 06/30/2023] HYDROcodone-acetaminophen (NORCO/VICODIN) 5-325 MG tablet Take 1 tablet by mouth every 8 (eight) hours as needed for severe pain. Must last 30 days 90 tablet 0   ipratropium-albuterol (DUONEB) 0.5-2.5 (3) MG/3ML SOLN USE 1 VIAL VIA NEBULIZER EVERY 6 HOURS AS NEEDED FOR SHORTNESS OF BREATH OR WHEEZING 1080 mL 0   melatonin 5 MG TABS Take 5 mg by mouth at bedtime.     naloxone (NARCAN) nasal spray 4 mg/0.1 mL Place 1 spray into the nose as needed for up to 365 doses (for opioid-induced respiratory depresssion). In case of emergency (overdose), spray once into each nostril. If no response within 3 minutes, repeat application and call 911. 1 each 0   omeprazole (PRILOSEC) 40 MG capsule Take 40 mg by mouth daily.     polyethylene glycol (MIRALAX / GLYCOLAX) 17 g packet Take 17 g by mouth daily as needed for mild constipation or moderate constipation.     potassium chloride SA (KLOR-CON M) 20 MEQ tablet Take 2 tablets every morning and 1 tablet every evening (Patient taking differently: 40 mEq 2 (two) times daily.) 270 tablet 3   pregabalin (LYRICA) 75 MG capsule Take 1 capsule (75 mg total) by mouth 3 (three) times daily. (Patient not taking: Reported on 05/23/2023) 90 capsule 2   roflumilast (DALIRESP) 500 MCG TABS tablet Take 500 mcg by mouth daily.     Semaglutide-Weight Management (WEGOVY) 0.25 MG/0.5ML SOAJ Inject 0.25 mg into the skin once a week. 2 mL 5   sertraline (ZOLOFT) 100 MG tablet Take 2 tablets (  200 mg total) by mouth daily. Please schedule office visit before any future refill. 60 tablet 0   simvastatin (ZOCOR) 20 MG tablet  Take 20 mg by mouth at bedtime.     spironolactone (ALDACTONE) 25 MG tablet TAKE 1 TABLET BY MOUTH ONCE DAILY (Patient taking differently: Take 12.5 mg by mouth daily.) 30 tablet 5   torsemide (DEMADEX) 20 MG tablet Take 40-80 mg by mouth See admin instructions. Take 4 tablets (80mg ) by mouth every morning and take 2 tablets (40mg ) by mouth every afternoon     No current facility-administered medications for this visit.   Vitals:   06/05/23 1346  BP: 129/72  Pulse: 88  SpO2: 94%  Weight: 215 lb (97.5 kg)  Height: 5\' 2"  (1.575 m)   Wt Readings from Last 3 Encounters:  06/05/23 215 lb (97.5 kg)  05/23/23 207 lb (93.9 kg)  05/09/23 215 lb (97.5 kg)   Lab Results  Component Value Date   CREATININE 0.71 11/08/2022   CREATININE 0.84 11/07/2022   CREATININE 0.71 11/06/2022   PHYSICAL EXAM:  General:  Well appearing. No resp difficulty HEENT: normal Neck: supple. JVP flat. No lymphadenopathy or thryomegaly appreciated. Cor: PMI normal. Regular rate & rhythm. No rubs, gallops or murmurs. Lungs: clear Abdomen: soft, nontender, nondistended. No hepatosplenomegaly. No bruits or masses.  Extremities: no cyanosis, clubbing, rash, edema Neuro: alert & oriented x3, cranial nerves grossly intact. Moves all 4 extremities w/o difficulty. Affect pleasant.   ECG: not done   ASSESSMENT & PLAN:  1: NICM with preserved ejection fraction- - likely due to pulmonary HTN/ OSA - NYHA class III - euvolemic today - weighing daily; reminded to call for an overnight weight gain of >2 pounds or a weekly weight gain of >5 pounds - weight down 2 pounds from last visit here 1 month ago - continue farxiga 10mg  daily - continue torsemide 80mg  AM/ 40mg  PM - continue potassium BID  - continue spironolactone 12.5mg  daily (this was decreased at last visit due to hypotension) - BMP today - furoscix PRN; she says that it's been "awhile" since she last used this  - saw [facility] provider (Bensimhon)  12/23 - wearing compression socks daily although doesn't have them on today - BNP 08/31/22 was 36.4  2: HTN- - BP 129/72 - saw PCP (Bronstein) 07/24 - BMP 05/01/23 reviewed and showed sodium 141, potassium 3.2, creatinine 1.0 and GFR 63  - BMP today  3: COPD- - wearing bipap nightly - oxygen at 6L around the clock on continuous system - saw pulmonology Meredeth Ide) 06/24  4: Tobacco use- - smoking 1 ppd - removes herself from the oxygen when smoking - complete cessation discussed  5: Atrial flutter- - previous cardioversion done - saw cardiologist Juliann Pares) 07/24 - continue diltazem CD 180mg  daily - continue amiodarone 200mg  daily  6: Pulmonary HTN- - mild to moderate on cath in 2020 - cath with high output physiology. No shunts on TEE. No evidence of anomalous PV drainage or other shunts on CT - CT chest 2023 no PE. 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  - no role for selective vasodilators  - continue O2 and Bipap support  Return in 2 months, sooner if needed.

## 2023-06-05 NOTE — Patient Instructions (Incomplete)
Epidural Steroid Injection Patient Information  Description: The epidural space surrounds the nerves as they exit the spinal cord.  In some patients, the nerves can be compressed and inflamed by a bulging disc or a tight spinal canal (spinal stenosis).  By injecting steroids into the epidural space, we can bring irritated nerves into direct contact with a potentially helpful medication.  These steroids act directly on the irritated nerves and can reduce swelling and inflammation which often leads to decreased pain.  Epidural steroids may be injected anywhere along the spine and from the neck to the low back depending upon the location of your pain.   After numbing the skin with local anesthetic (like Novocaine), a small needle is passed into the epidural space slowly.  You may experience a sensation of pressure while this is being done.  The entire block usually last less than 10 minutes.  Conditions which may be treated by epidural steroids:  Low back and leg pain Neck and arm pain Spinal stenosis Post-laminectomy syndrome Herpes zoster (shingles) pain Pain from compression fractures  Preparation for the injection:  Do not eat any solid food or dairy products within 8 hours of your appointment.  You may drink clear liquids up to 3 hours before appointment.  Clear liquids include water, black coffee, juice or soda.  No milk or cream please. You may take your regular medication, including pain medications, with a sip of water before your appointment  Diabetics should hold regular insulin (if taken separately) and take 1/2 normal NPH dos the morning of the procedure.  Carry some sugar containing items with you to your appointment. A driver must accompany you and be prepared to drive you home after your procedure.  Bring all your current medications with your. An IV may be inserted and sedation may be given at the discretion of the physician.   A blood pressure cuff, EKG and other monitors will  often be applied during the procedure.  Some patients may need to have extra oxygen administered for a short period. You will be asked to provide medical information, including your allergies, prior to the procedure.  We must know immediately if you are taking blood thinners (like Coumadin/Warfarin)  Or if you are allergic to IV iodine contrast (dye). We must know if you could possible be pregnant.  Possible side-effects: Bleeding from needle site Infection (rare, may require surgery) Nerve injury (rare) Numbness & tingling (temporary) Difficulty urinating (rare, temporary) Spinal headache ( a headache worse with upright posture) Light -headedness (temporary) Pain at injection site (several days) Decreased blood pressure (temporary) Weakness in arm/leg (temporary) Pressure sensation in back/neck (temporary)  Call if you experience: Fever/chills associated with headache or increased back/neck pain. Headache worsened by an upright position. New onset weakness or numbness of an extremity below the injection site Hives or difficulty breathing (go to the emergency room) Inflammation or drainage at the infection site Severe back/neck pain Any new symptoms which are concerning to you  Please note:  Although the local anesthetic injected can often make your back or neck feel good for several hours after the injection, the pain will likely return.  It takes 3-7 days for steroids to work in the epidural space.  You may not notice any pain relief for at least that one week.  If effective, we will often do a series of three injections spaced 3-6 weeks apart to maximally decrease your pain.  After the initial series, we generally will wait several months before   considering a repeat injection of the same type.  If you have any questions, please call [phone] [facility] ____________________________________________________________________________________________  Post-Procedure Discharge Instructions  Instructions: Apply ice:  Purpose: This will minimize any swelling and discomfort after procedure.  When: Day of procedure, as soon as you get home. How: Fill a plastic sandwich bag with crushed ice. Cover it with a small towel and apply to injection site. How long: (15 min on, 15 min off) Apply for 15 minutes then remove x 15 minutes.  Repeat sequence on day of procedure, until you go to bed. Apply heat:  Purpose: To treat any soreness and discomfort from the procedure. When: Starting the next day after the procedure. How: Apply heat to procedure site starting the day following the procedure. How long: May continue to repeat daily, until discomfort goes away. Food intake: Start with clear liquids (like water) and advance to regular food, as tolerated.  Physical activities: Keep activities to a minimum for the first 8 hours after the procedure. After that, then as tolerated. Driving: If you have received any sedation, be responsible and do not drive. You are not allowed to drive for 24 hours after having sedation. Blood thinner: (Applies only to those taking blood thinners) You may restart your blood thinner 6 hours after your procedure. Insulin: (Applies only to Diabetic patients taking insulin) As soon as you can eat, you may resume your normal dosing schedule. Infection prevention: Keep procedure site clean and dry. Shower daily and clean area with soap and water. Post-procedure Pain Diary: Extremely important that this be done correctly and accurately. Recorded information will be used to determine the next step in treatment. For the purpose of accuracy, follow these rules: Evaluate only the area treated. Do not report or include pain from an untreated area. For the purpose of this evaluation, ignore all other areas of pain, except for the treated  area. After your procedure, avoid taking a long nap and attempting to complete the pain diary after you wake up. Instead, set your alarm clock to go off every hour, on the hour, for the initial 8 hours after the procedure. Document the duration of the numbing medicine, and the relief you are getting from it. Do not go to sleep and attempt to complete it later. It will not be accurate. If you received sedation, it is likely that you were given a medication that may cause amnesia. Because of this, completing the diary at a later time may cause the information to be inaccurate. This information is needed to plan your care. Follow-up appointment: Keep your post-procedure follow-up evaluation appointment after the procedure (usually 2 weeks for most procedures, 6 weeks for radiofrequencies). DO NOT FORGET to bring you pain diary with you.   Expect: (What should I expect to see with my procedure?) From numbing medicine (AKA: Local Anesthetics): Numbness or decrease in pain. You may also experience some weakness, which if present, could last for the duration of the local anesthetic. Onset: Full effect within 15 minutes of injected. Duration: It will depend on the type of local anesthetic used. On the average, 1 to 8 hours.  From steroids (Applies only if steroids were used): Decrease in swelling or inflammation. Once inflammation is improved, relief of the pain will follow. Onset of benefits: Depends on the amount of swelling present. The more swelling, the longer it will take for the benefits to be seen. In some cases, up to 10 days. Duration:   Steroids will stay in the system x 2 weeks. Duration of benefits will depend on multiple posibilities including persistent irritating factors. Side-effects: If present, they may typically last 2 weeks (the duration of the steroids). Frequent: Cramps (if they occur, drink Gatorade and take over-the-counter Magnesium 450-500 mg once to twice a day); water retention with  temporary weight gain; increases in blood sugar; decreased immune system response; increased appetite. Occasional: Facial flushing (red, warm cheeks); mood swings; menstrual changes. Uncommon: Long-term decrease or suppression of natural hormones; bone thinning. (These are more common with higher doses or more frequent use. This is why we prefer that our patients avoid having any injection therapies in other practices.)  Very Rare: Severe mood changes; psychosis; aseptic necrosis. From procedure: Some discomfort is to be expected once the numbing medicine wears off. This should be minimal if ice and heat are applied as instructed.  Call if: (When should I call?) You experience numbness and weakness that gets worse with time, as opposed to wearing off. New onset bowel or bladder incontinence. (Applies only to procedures done in the spine)  Emergency Numbers: Durning business hours (Monday - Thursday, 8:00 AM - 4:00 PM) (Friday, 9:00 AM - 12:00 Noon): [phone] After hours: [phone] NOTE: If you are having a problem and are unable connect with, or to talk to a provider, then go to your nearest urgent care or emergency department. If the problem is serious and urgent, please call 911. ____________________________________________________________________________________________    

## 2023-06-06 ENCOUNTER — Encounter: Payer: Self-pay | Admitting: Family

## 2023-06-06 ENCOUNTER — Ambulatory Visit
Admission: RE | Admit: 2023-06-06 | Discharge: 2023-06-06 | Disposition: A | Discharge status: Home / Self Care | Payer: Medicare HMO | Source: Ambulatory Visit | Attending: Pain Medicine | Admitting: Pain Medicine

## 2023-06-06 ENCOUNTER — Encounter: Payer: Self-pay | Admitting: Pain Medicine

## 2023-06-06 ENCOUNTER — Ambulatory Visit (HOSPITAL_BASED_OUTPATIENT_CLINIC_OR_DEPARTMENT_OTHER): Payer: Medicare HMO | Admitting: Pain Medicine

## 2023-06-06 VITALS — BP 131/76 | HR 77 | Temp 98.2°F | Resp 16 | Ht 62.0 in | Wt 214.0 lb

## 2023-06-06 DIAGNOSIS — M5126 Other intervertebral disc displacement, lumbar region: Secondary | ICD-10-CM

## 2023-06-06 DIAGNOSIS — F419 Anxiety disorder, unspecified: Secondary | ICD-10-CM | POA: Diagnosis not present

## 2023-06-06 DIAGNOSIS — M5416 Radiculopathy, lumbar region: Secondary | ICD-10-CM | POA: Diagnosis not present

## 2023-06-06 DIAGNOSIS — F17210 Nicotine dependence, cigarettes, uncomplicated: Secondary | ICD-10-CM | POA: Diagnosis not present

## 2023-06-06 DIAGNOSIS — Z8616 Personal history of COVID-19: Secondary | ICD-10-CM | POA: Diagnosis not present

## 2023-06-06 DIAGNOSIS — M4310 Spondylolisthesis, site unspecified: Secondary | ICD-10-CM | POA: Diagnosis not present

## 2023-06-06 DIAGNOSIS — I5032 Chronic diastolic (congestive) heart failure: Secondary | ICD-10-CM | POA: Diagnosis not present

## 2023-06-06 DIAGNOSIS — R937 Abnormal findings on diagnostic imaging of other parts of musculoskeletal system: Secondary | ICD-10-CM | POA: Diagnosis not present

## 2023-06-06 DIAGNOSIS — M48062 Spinal stenosis, lumbar region with neurogenic claudication: Secondary | ICD-10-CM | POA: Diagnosis not present

## 2023-06-06 DIAGNOSIS — Z716 Tobacco abuse counseling: Secondary | ICD-10-CM | POA: Diagnosis not present

## 2023-06-06 DIAGNOSIS — M5116 Intervertebral disc disorders with radiculopathy, lumbar region: Secondary | ICD-10-CM

## 2023-06-06 DIAGNOSIS — M48061 Spinal stenosis, lumbar region without neurogenic claudication: Secondary | ICD-10-CM

## 2023-06-06 DIAGNOSIS — M4316 Spondylolisthesis, lumbar region: Secondary | ICD-10-CM | POA: Diagnosis not present

## 2023-06-06 DIAGNOSIS — J449 Chronic obstructive pulmonary disease, unspecified: Secondary | ICD-10-CM | POA: Diagnosis not present

## 2023-06-06 DIAGNOSIS — G8929 Other chronic pain: Secondary | ICD-10-CM | POA: Diagnosis not present

## 2023-06-06 DIAGNOSIS — M47816 Spondylosis without myelopathy or radiculopathy, lumbar region: Secondary | ICD-10-CM

## 2023-06-06 DIAGNOSIS — M5136 Other intervertebral disc degeneration, lumbar region: Secondary | ICD-10-CM

## 2023-06-06 DIAGNOSIS — Z79899 Other long term (current) drug therapy: Secondary | ICD-10-CM | POA: Diagnosis not present

## 2023-06-06 DIAGNOSIS — I4892 Unspecified atrial flutter: Secondary | ICD-10-CM | POA: Diagnosis not present

## 2023-06-06 DIAGNOSIS — F32A Depression, unspecified: Secondary | ICD-10-CM | POA: Diagnosis not present

## 2023-06-06 DIAGNOSIS — R0602 Shortness of breath: Secondary | ICD-10-CM | POA: Diagnosis not present

## 2023-06-06 DIAGNOSIS — M79605 Pain in left leg: Secondary | ICD-10-CM | POA: Diagnosis not present

## 2023-06-06 DIAGNOSIS — I272 Pulmonary hypertension, unspecified: Secondary | ICD-10-CM | POA: Diagnosis not present

## 2023-06-06 DIAGNOSIS — I110 Hypertensive heart disease with heart failure: Secondary | ICD-10-CM | POA: Diagnosis not present

## 2023-06-06 DIAGNOSIS — I428 Other cardiomyopathies: Secondary | ICD-10-CM | POA: Diagnosis not present

## 2023-06-06 LAB — BASIC METABOLIC PANEL
BUN/Creatinine Ratio: 20 (ref 12–28)
BUN: 19 mg/dL (ref 8–27)
CO2: 27 mmol/L (ref 20–29)
Calcium: 9.8 mg/dL (ref 8.7–10.3)
Chloride: 96 mmol/L (ref 96–106)
Creatinine, Ser: 0.97 mg/dL (ref 0.57–1.00)
Glucose: 98 mg/dL (ref 70–99)
Potassium: 3.8 mmol/L (ref 3.5–5.2)
Sodium: 140 mmol/L (ref 134–144)
eGFR: 64 mL/min/{1.73_m2} (ref >59)

## 2023-06-06 MED ORDER — ROPIVACAINE HCL 2 MG/ML IJ SOLN
2.0000 mL | Freq: Once | INTRAMUSCULAR | Status: AC
  Administered 2023-06-06: 2 mL via EPIDURAL
  Filled 2023-06-06: qty 20

## 2023-06-06 MED ORDER — PENTAFLUOROPROP-TETRAFLUOROETH EX AERO
INHALATION_SPRAY | Freq: Once | CUTANEOUS | Status: AC
  Administered 2023-06-06: 30 via TOPICAL
  Filled 2023-06-06: qty 116

## 2023-06-06 MED ORDER — LACTATED RINGERS IV SOLN: Freq: Once | INTRAVENOUS | Status: AC

## 2023-06-06 MED ORDER — LIDOCAINE HCL 2 % IJ SOLN
20.0000 mL | Freq: Once | INTRAMUSCULAR | Status: AC
  Administered 2023-06-06: 100 mg
  Filled 2023-06-06: qty 40

## 2023-06-06 MED ORDER — IOHEXOL 180 MG/ML  SOLN
10.0000 mL | Freq: Once | INTRAMUSCULAR | Status: AC
  Administered 2023-06-06: 10 mL via EPIDURAL
  Filled 2023-06-06: qty 20

## 2023-06-06 MED ORDER — TRIAMCINOLONE ACETONIDE 40 MG/ML IJ SUSP
40.0000 mg | Freq: Once | INTRAMUSCULAR | Status: AC
  Administered 2023-06-06: 40 mg
  Filled 2023-06-06: qty 1

## 2023-06-06 MED ORDER — MIDAZOLAM HCL 2 MG/2ML IJ SOLN
0.5000 mg | Freq: Once | INTRAMUSCULAR | Status: AC
  Administered 2023-06-06: 2 mg via INTRAVENOUS
  Filled 2023-06-06: qty 2

## 2023-06-06 MED ORDER — SODIUM CHLORIDE 0.9% FLUSH
2.0000 mL | Freq: Once | INTRAVENOUS | Status: AC
  Administered 2023-06-06: 2 mL

## 2023-06-07 ENCOUNTER — Telehealth: Payer: Self-pay | Admitting: *Deleted

## 2023-06-07 NOTE — Telephone Encounter (Signed)
Post procedure call:   no  questions or concerns.  

## 2023-06-08 DIAGNOSIS — J449 Chronic obstructive pulmonary disease, unspecified: Secondary | ICD-10-CM | POA: Diagnosis not present

## 2023-06-14 ENCOUNTER — Inpatient Hospital Stay: Payer: Medicare HMO | Attending: Internal Medicine

## 2023-06-14 DIAGNOSIS — D500 Iron deficiency anemia secondary to blood loss (chronic): Secondary | ICD-10-CM | POA: Insufficient documentation

## 2023-06-14 LAB — CBC WITH DIFFERENTIAL/PLATELET
Abs Immature Granulocytes: 0.16 10*3/uL — ABNORMAL HIGH (ref 0.00–0.07)
Basophils Absolute: 0.1 10*3/uL (ref 0.0–0.1)
Basophils Relative: 1 %
Eosinophils Absolute: 0 10*3/uL (ref 0.0–0.5)
Eosinophils Relative: 0 %
HCT: 45.4 % (ref 36.0–46.0)
Hemoglobin: 14.5 g/dL (ref 12.0–15.0)
Immature Granulocytes: 1 %
Lymphocytes Relative: 9 %
Lymphs Abs: 1.1 10*3/uL (ref 0.7–4.0)
MCH: 30.1 pg (ref 26.0–34.0)
MCHC: 31.9 g/dL (ref 30.0–36.0)
MCV: 94.2 fL (ref 80.0–100.0)
Monocytes Absolute: 0.6 10*3/uL (ref 0.1–1.0)
Monocytes Relative: 5 %
Neutro Abs: 10.7 10*3/uL — ABNORMAL HIGH (ref 1.7–7.7)
Neutrophils Relative %: 84 %
Platelets: 234 10*3/uL (ref 150–400)
RBC: 4.82 MIL/uL (ref 3.87–5.11)
RDW: 16.6 % — ABNORMAL HIGH (ref 11.5–15.5)
WBC: 12.7 10*3/uL — ABNORMAL HIGH (ref 4.0–10.5)
nRBC: 0 % (ref 0.0–0.2)

## 2023-06-14 LAB — IRON AND TIBC
Iron: 65 ug/dL (ref 28–170)
Saturation Ratios: 14 % (ref 10.4–31.8)
TIBC: 459 ug/dL — ABNORMAL HIGH (ref 250–450)
UIBC: 394 ug/dL

## 2023-06-14 LAB — FERRITIN: Ferritin: 48 ng/mL (ref 11–307)

## 2023-06-20 NOTE — Progress Notes (Unsigned)
PROVIDER NOTE: Information contained herein reflects review and annotations entered in association with encounter. Interpretation of such information and data should be left to medically-trained personnel. Information provided to patient can be located elsewhere in the medical record under "Patient Instructions". Document created using STT-dictation technology, any transcriptional errors that may result from process are unintentional.    Patient: Theresa [name]  Service Category: E/M  Provider: Oswaldo Done, MD  DOB: 17-Oct-1956  DOS: 06/22/2023  Referring Provider: Dorothey Baseman, MD  MRN: [id number]  Specialty: Interventional Pain Management  PCP: Dorothey Baseman, MD  Type: Established Patient  Setting: Ambulatory outpatient    Location: Office  Delivery: Face-to-face     HPI  Ms. AINSLEIGH DROHAN, a 67 y.o. year old female, is here today because of her Chronic left-sided lumbar radiculopathy [M54.16]. Ms. Bartek primary complain today is No chief complaint on file.  Pertinent problems: Ms. Tenaglia has Neuritis or radiculitis due to rupture of lumbar intervertebral disc; DDD (degenerative disc disease), lumbar; Carpal tunnel syndrome; Neurogenic pain; Neuropathic pain; Musculoskeletal pain; Lumbar spondylosis (L4-5); Chronic low back pain (1ry area of Pain) (Bilateral) (R>L) w/o sciatica; Chronic lower extremity pain (2ry area of Pain) (Left); Chronic lumbar radicular pain (L4 & S1 Dermatome) (Left); Chronic shoulder pain (Right side); Chronic upper extremity pain (Right-sided); Cervical radiculitis (Right side); Abnormal x-ray of lumbar spine; Chronic pain syndrome (significant psychosocial component); 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; 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); Lumbar facet syndrome (Bilateral) (R>L); Chronic sacroiliac joint pain (Bilateral); Lumbar facet hypertrophy (L3-4, L5-S1) (Bilateral); Lumbar spinal stenosis (L4-5); Lumbar foraminal  stenosis (L4-5) (Left); 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); Lumbosacral radiculopathy at L4; Chronic shoulder pain (Left); Arthralgia of acromioclavicular joint (Right); Acromioclavicular joint DJD (Right); Osteoarthritis of shoulder (Right); Chronic hip pain (3ry area of Pain) (Bilateral) (L>R); Chronic lower extremity pain (Bilateral) (R>L); Cervicalgia (Bilateral) (R>L); Spondylosis without myelopathy or radiculopathy, lumbosacral region; Chronic knee pain (Left); Chronic left-sided lumbar radiculopathy; Abnormal MRI, lumbar spine (12/09/2021); 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 Pain Assessment: Severity of   is reported as a  /10. Location:    / . Onset:  . Quality:  . Timing:  . Modifying factor(s):  Marland Kitchen Vitals:  vitals were not taken for this visit.  BMI: Estimated body mass index is 39.14 kg/m as calculated from the following:   Height as of 06/06/23: 5\' 2"  (1.575 m).   Weight as of 06/06/23: 214 lb (97.1 kg). Last encounter: 05/23/2023. Last procedure: 06/06/2023.  Reason for encounter: post-procedure evaluation and assessment. ***  Post-procedure evaluation   Procedure: Lumbar epidural steroid injection (LESI) (interlaminar) #2    Laterality: Left   Level:  L2-3 Level.  Imaging: Fluoroscopic guidance         Anesthesia: Local anesthesia (1-2% Lidocaine) Anxiolysis: IV Versed 2.0 mg Sedation: No Sedation                       DOS: 06/06/2023  Performed by: Oswaldo Done, MD  Purpose: Diagnostic/Therapeutic Indications: Lumbar radicular pain of intraspinal etiology of more than 4 weeks that has failed to respond to conservative therapy and is severe enough to impact quality of life  or function. 1. Chronic left-sided lumbar radiculopathy  2. Chronic lower extremity pain (2ry area of Pain) (Left)   3. Chronic lumbar radicular pain (L4 & S1 Dermatome) (Left)   4. DDD (degenerative disc disease), lumbar   5. Grade 1 Anterolisthesis of lumbar spine (4mm) (L3/L4)   6. Grade 1 Retrolisthesis (2mm) (L5/S1)   7. Lumbar disc extrusion (L2-3) (Left)   8. 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)   9. 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)   10. Spinal stenosis of lumbar region with neurogenic claudication   11. Lumbar spondylosis (L4-5)   12. Neuritis or radiculitis due to rupture of lumbar intervertebral disc   13. Abnormal MRI, lumbar spine (12/09/2021)    NAS-11 Pain score:   Pre-procedure: 8 /10   Post-procedure: 0-No pain/10      Effectiveness:  Initial hour after procedure:   ***. Subsequent 4-6 hours post-procedure:   ***. Analgesia past initial 6 hours:   ***. Ongoing improvement:  Analgesic:  *** Function:    ***    ROM:    ***     Pharmacotherapy Assessment  Analgesic: Hydrocodone/APAP 5/325 one tablet every 8 hours (15 mg/day of hydrocodone) MME/day: 15 mg/day.   Monitoring:  PMP: PDMP reviewed during this encounter.       Pharmacotherapy: No side-effects or adverse reactions reported. Compliance: No problems identified. Effectiveness: Clinically acceptable.  No notes on file  No results found for: "CBDTHCR" No results found for: "D8THCCBX" No results found for: "D9THCCBX"  UDS:  Summary  Date Value Ref Range Status  10/26/2022 Note  Final    Comment:    ==================================================================== [employer] Select 13 (MW) ==================================================================== Specimen Alert Note: Urinary creatinine is low; ability to detect some drugs may be compromised. Interpret results with caution.  (Creatinine) ==================================================================== Test                             Result       Flag       Units  Drug Present and Declared for Prescription Verification   Hydrocodone                    3780         EXPECTED   ng/mg creat   Norhydrocodone                 1400         EXPECTED   ng/mg creat    Sources of hydrocodone include scheduled prescription medications.    Norhydrocodone is an expected metabolite of hydrocodone.  Drug Absent but Declared for Prescription Verification   Butalbital                     Not Detected UNEXPECTED ==================================================================== Test                      Result    Flag   Units      Ref Range   Creatinine              10        LL     mg/dL      >=95 ==================================================================== Declared Medications:  The flagging and interpretation on this report are based on the  following declared medications.  Unexpected results may arise from  inaccuracies in the declared medications.   **Note: The testing scope of this panel includes these medications:   Butalbital (Fioricet)  Hydrocodone (Norco)   **  Note: The testing scope of this panel does not include the  following reported medications:   Acetaminophen (Tylenol)  Acetaminophen (Fioricet)  Acetaminophen (Norco)  Albuterol (Ventolin HFA)  Albuterol (Duoneb)  Amiodarone  Buspirone (Buspar)  Caffeine (Fioricet)  Cholecalciferol  Dapagliflozin (Farxiga)  Diclofenac (Voltaren)  Diltiazem (Cardizem)  Fluticasone (Trelegy)  Helium  Ipratropium (Duoneb)  Iron  Metolazone (Zaroxolyn)  Naloxone (Narcan)  Oxygen  Pantoprazole (Protonix)  Potassium (Klor-Con)  Rivaroxaban (Xarelto)  Roflumilast (Daliresp)  Sertraline (Zoloft)  Simvastatin (Zocor)  Spironolactone (Aldactone)  Torsemide (Demadex)  Umeclidinium (Trelegy)  Varenicline (Chantix)  Vilanterol  (Trelegy) ==================================================================== For clinical consultation, please call [phone]. ====================================================================       ROS  Constitutional: Denies any fever or chills Gastrointestinal: No reported hemesis, hematochezia, vomiting, or acute GI distress Musculoskeletal: Denies any acute onset joint swelling, redness, loss of ROM, or weakness Neurological: No reported episodes of acute onset apraxia, aphasia, dysarthria, agnosia, amnesia, paralysis, loss of coordination, or loss of consciousness  Medication Review  Cholecalciferol, Fluticasone-Umeclidin-Vilant, Furosemide, HYDROcodone-acetaminophen, Semaglutide-Weight Management, acetaminophen, albuterol, amiodarone, busPIRone, butalbital-acetaminophen-caffeine, dapagliflozin propanediol, diclofenac Sodium, diltiazem, ferrous sulfate, ipratropium-albuterol, melatonin, naloxone, omeprazole, polyethylene glycol, potassium chloride SA, pregabalin, roflumilast, sertraline, simvastatin, spironolactone, and torsemide  History Review  Allergy: Ms. Bafford is allergic to gabapentin, codeine, and meloxicam. Drug: Ms. Lungren  reports current drug use. Alcohol:  reports no history of alcohol use. Tobacco:  reports that she has been smoking cigarettes. She has a 66 pack-year smoking history. She has never used smokeless tobacco. Social: Ms. Mcinerny  reports that she has been smoking cigarettes. She has a 66 pack-year smoking history. She has never used smokeless tobacco. She reports current drug use. She reports that she does not drink alcohol. Medical:  has a past medical history of Acute postoperative pain (10/03/2017), Anxiety, BiPAP (biphasic positive airway pressure) dependence, Carpal tunnel syndrome, CHF (congestive heart failure) (HCC), CHF (congestive heart failure) (HCC), Chronic generalized abdominal pain, Chronic rhinitis, COPD (chronic obstructive pulmonary  disease) (HCC), DDD (degenerative disc disease), cervical, DDD (degenerative disc disease), lumbosacral, Depression, Dyspnea, Edema, Flu, Gastritis, GERD (gastroesophageal reflux disease), Hematuria, Hernia of abdominal wall (07/17/2018), Hernia, abdominal, Hypertension, Kidney stones, Low back pain, Lumbar radiculitis, Malodorous urine, Muscle weakness, Obesity, Oxygen dependent, Pneumonia due to COVID-19 virus (02/03/2020), Renal cyst, Sensory urge incontinence, Thyroid activity decreased, Tobacco abuse, and Wheezing. Surgical: Ms. Hailu  has a past surgical history that includes Abdominal hysterectomy; Tonsillectomy; Carpal tunnel release (Left, 2012); Tubal ligation; epigastric hernia repair (N/A, 08/13/2018); Umbilical hernia repair (N/A, 08/13/2018); RIGHT/LEFT HEART CATH AND CORONARY ANGIOGRAPHY (N/A, 07/11/2019); TEE without cardioversion (N/A, 04/29/2022); Cardioversion (N/A, 04/29/2022); Esophagogastroduodenoscopy (N/A, 06/21/2022); and cataract surgery (Bilateral). Family: family history includes Alcohol abuse in her father; Breast cancer in her sister; Cancer in her brother, brother, and sister; Coronary artery disease in her mother; Heart disease in her father and mother; Kidney cancer in her brother; Lung cancer in her sister; Pneumonia in her brother; Stroke in her mother.  Laboratory Chemistry Profile   Renal Lab Results  Component Value Date   BUN 19 06/05/2023   CREATININE 0.97 06/05/2023   BCR 20 06/05/2023   GFRAA >60 06/26/2020   GFRNONAA >60 11/08/2022    Hepatic Lab Results  Component Value Date   AST 23 11/05/2022   ALT 18 11/05/2022   ALBUMIN 3.5 11/05/2022   ALKPHOS 82 11/05/2022   LIPASE 57 (H) 06/19/2022   AMMONIA 40 (H) 10/11/2019    Electrolytes Lab Results  Component Value Date   NA  140 06/05/2023   K 3.8 06/05/2023   CL 96 06/05/2023   CALCIUM 9.8 06/05/2023   MG 2.8 (H) 11/08/2022   PHOS 3.2 06/21/2022    Bone Lab Results  Component Value Date    25OHVITD1 13 (L) 09/17/2015   25OHVITD2 <1.0 09/17/2015   25OHVITD3 13 09/17/2015    Inflammation (CRP: Acute Phase) (ESR: Chronic Phase) Lab Results  Component Value Date   CRP 0.9 04/02/2022   ESRSEDRATE 17 04/06/2022   LATICACIDVEN 0.9 04/01/2022         Note: Above Lab results reviewed.  Recent Imaging Review  DG PAIN CLINIC C-ARM 1-60 MIN NO REPORT Fluoro was used, but no Radiologist interpretation will be provided.  Please refer to "NOTES" tab for provider progress note. Note: Reviewed        Physical Exam  General appearance: Well nourished, well developed, and well hydrated. In no apparent acute distress Mental status: Alert, oriented x 3 (person, place, & time)       Respiratory: No evidence of acute respiratory distress Eyes: PERLA Vitals: There were no vitals taken for this visit. BMI: Estimated body mass index is 39.14 kg/m as calculated from the following:   Height as of 06/06/23: 5\' 2"  (1.575 m).   Weight as of 06/06/23: 214 lb (97.1 kg). Ideal: Patient weight not recorded  Assessment   Diagnosis Status  1. Chronic left-sided lumbar radiculopathy   2. Chronic lower extremity pain (2ry area of Pain) (Left)   3. Chronic lumbar radicular pain (L4 & S1 Dermatome) (Left)   4. Postop check    Controlled Controlled Controlled   Updated Problems: No problems updated.  Plan of Care  Problem-specific:  No problem-specific Assessment & Plan notes found for this encounter.  Ms. BRINLYN NICKOLAS has a current medication list which includes the following long-term medication(s): albuterol, amiodarone, ferrous sulfate, hydrocodone-acetaminophen, hydrocodone-acetaminophen, [START ON 06/30/2023] hydrocodone-acetaminophen, ipratropium-albuterol, potassium chloride sa, pregabalin, sertraline, and spironolactone.  Pharmacotherapy (Medications Ordered): No orders of the defined types were placed in this encounter.  Orders:  No orders of the defined types were placed in  this encounter.  Follow-up plan:   No follow-ups on file.      Interventional Therapies  Risk Factors  Considerations  Medical Comorbidities:  COPD  Emphysema  Chronic Smoker  O2 dependent  SOB  CHF  Hx. of A-Fib (Cardioverted)  Obesity  CAD   NOTE: NO further Lumbar RFA until BMI <35. Patient taken off of Xarelto after cardioversion.   Planned  Pending:   Therapeutic left L2-3 LESI #1 (05/09/2023)     Under consideration:   Diagnostic bilateral cervical facet MBB #1  Palliative left L2-3 LESI #4  NOTE: NO further Lumbar RFA until BMI <35.   Completed:   Palliative right lumbar facet MBB x7 (09/20/2022) (100/100/100x5days/0)  Palliative left lumbar facet MBB x6 (11/11/2021) (100/100/0/0)  Therapeutic left L2-3 LESI x3 (09/23/2021) (80/80/80)  Diagnostic/palliative left L4 TFESI x1 (12/26/2017) (100/100/50/>50)  Diagnostic/palliative left L4-5 LESI x2 (07/30/2019) (100/100/0/0)  Diagnostic/palliative right L4 TFESI x1 (01/25/2018) (100/100/50/50)  Diagnostic/palliative right L4-5 LESI x1 (01/25/2018) (100/100/50/50)  Palliative left lumbar facet RFA x1 (06/27/2017) (0/0/100/>50)  Palliative right lumbar facet RFA x1 (10/03/2017) (100/100/75)    Therapeutic  Palliative (PRN) options:   Evaluation needed before each determination.   Completed by other providers:      Pharmacotherapy  Nonopioids transferred 08/03/2020: Gabapentin and baclofen Note: Patient taken off of Xarelto after cardioversion.  Recent Visits Date Type Provider Dept  06/06/23 Procedure visit Delano Metz, MD [facility]  05/23/23 Office Visit Delano Metz, MD [facility]  05/09/23 Procedure visit Delano Metz, MD [facility]  05/01/23 Office Visit Delano Metz, MD [facility]  03/28/23 Office Visit Delano Metz, MD [facility]  Showing recent visits within past 90 days and meeting all other  requirements Future Appointments Date Type Provider Dept  06/22/23 Appointment Delano Metz, MD [facility]  07/26/23 Appointment Delano Metz, MD [facility]  Showing future appointments within next 90 days and meeting all other requirements  I discussed the assessment and treatment plan with the patient. The patient was provided an opportunity to ask questions and all were answered. The patient agreed with the plan and demonstrated an understanding of the instructions.  Patient advised to call back or seek an in-person evaluation if the symptoms or condition worsens.  Duration of encounter: *** minutes.  Total time on encounter, as per AMA guidelines included both the face-to-face and non-face-to-face time personally spent by the physician and/or other qualified health care professional(s) on the day of the encounter (includes time in activities that require the physician or other qualified health care professional and does not include time in activities normally performed by clinical staff). Physician's time may include the following activities when performed: Preparing to see the patient (e.g., pre-charting review of records, searching for previously ordered imaging, lab work, and nerve conduction tests) Review of prior analgesic pharmacotherapies. Reviewing PMP Interpreting ordered tests (e.g., lab work, imaging, nerve conduction tests) Performing post-procedure evaluations, including interpretation of diagnostic procedures Obtaining and/or reviewing separately obtained history Performing a medically appropriate examination and/or evaluation Counseling and educating the patient/family/caregiver Ordering medications, tests, or procedures Referring and communicating with other health care professionals (when not separately reported) Documenting clinical information in the electronic or other health record Independently interpreting results (not separately  reported) and communicating results to the patient/ family/caregiver Care coordination (not separately reported)  Note by: Oswaldo Done, MD Date: 06/22/2023; Time: 5:04 PM

## 2023-06-21 ENCOUNTER — Inpatient Hospital Stay: Payer: Medicare HMO

## 2023-06-21 ENCOUNTER — Inpatient Hospital Stay: Payer: Medicare HMO | Attending: Oncology | Admitting: Oncology

## 2023-06-21 ENCOUNTER — Encounter: Payer: Self-pay | Admitting: Oncology

## 2023-06-21 VITALS — BP 140/73 | HR 73 | Temp 97.1°F | Resp 18 | Wt 216.4 lb

## 2023-06-21 DIAGNOSIS — Z803 Family history of malignant neoplasm of breast: Secondary | ICD-10-CM | POA: Diagnosis not present

## 2023-06-21 DIAGNOSIS — F17210 Nicotine dependence, cigarettes, uncomplicated: Secondary | ICD-10-CM | POA: Diagnosis not present

## 2023-06-21 DIAGNOSIS — Z79899 Other long term (current) drug therapy: Secondary | ICD-10-CM | POA: Insufficient documentation

## 2023-06-21 DIAGNOSIS — D500 Iron deficiency anemia secondary to blood loss (chronic): Secondary | ICD-10-CM | POA: Insufficient documentation

## 2023-06-21 DIAGNOSIS — Z801 Family history of malignant neoplasm of trachea, bronchus and lung: Secondary | ICD-10-CM | POA: Diagnosis not present

## 2023-06-21 NOTE — Assessment & Plan Note (Addendum)
Labs reviewed and discussed with patient. Lab Results  Component Value Date   HGB 14.5 06/14/2023   TIBC 459 (H) 06/14/2023   IRONPCTSAT 14 06/14/2023   FERRITIN 48 06/14/2023  Hemoglobin and iron level are stable.  I will hold off IV Venofer treatments.

## 2023-06-21 NOTE — Progress Notes (Signed)
Hematology/Oncology Consult note [phone] [phone]  CHIEF COMPLAINTS/REASON FOR VISIT:  Iron deficiency anemia  ASSESSMENT & PLAN:   Anemia due to chronic blood loss Labs reviewed and discussed with patient. Lab Results  Component Value Date   HGB 14.5 06/14/2023   TIBC 459 (H) 06/14/2023   IRONPCTSAT 14 06/14/2023   FERRITIN 48 06/14/2023  Hemoglobin and iron level are stable.  I will hold off IV Venofer treatments.   Patient is discharged from my clinic. I recommend patient to continue follow up with primary care physician. Patient may re-establish care in the future if clinically indicated.  All questions were answered. The patient knows to call the clinic with any problems, questions or concerns.  Rickard Patience, MD, PhD [facility] Hematology Oncology 06/21/2023     HISTORY OF PRESENTING ILLNESS:  Theresa [name] is a  67 y.o.  female with PMH listed below who was referred to me for anemia Reviewed patient's recent labs that was done.  She was found to have anemia since September 2023,  acute blood loss anemia Hb 7.4. She was on Xarelto for A Fib and Aspirin at that time, anticoagulation was held. She was transfused with PRBCs She underwent EGD, fount to have gastritis, non bleeding duodenal ulcers. Recently she follows up with Dr.Toledo and had repeat blood work on 09/19/22, which still showed persistent anemia with hemoglobin of 7.1, 09/27/2022 Ferritin 19, TIBC 578,  She has chronic respiratory failure due to pulmonary hypertension, on nasal cannula oxygen, follows ups with pulmonology.    INTERVAL HISTORY Theresa [name] is a 67 y.o. female who has above history reviewed by me today presents for follow up visit for iron deficiency anemia. During interval, she has had epistaxis, currently off Xarelto. She denies any additional nose bleeding episodes.  She tolerated IV Venofer treatments.    MEDICAL HISTORY:  Past Medical History:  Diagnosis  Date   Acute postoperative pain 10/03/2017   Anxiety    BiPAP (biphasic positive airway pressure) dependence    at hs   Carpal tunnel syndrome    CHF (congestive heart failure) (HCC)    1/18   CHF (congestive heart failure) (HCC)    Chronic generalized abdominal pain    Chronic rhinitis    COPD (chronic obstructive pulmonary disease) (HCC)    DDD (degenerative disc disease), cervical    DDD (degenerative disc disease), lumbosacral    Depression    Dyspnea    Edema    Flu    1/18   Gastritis    GERD (gastroesophageal reflux disease)    Hematuria    Hernia of abdominal wall 07/17/2018   Hernia, abdominal    Hypertension    Kidney stones    Low back pain    Lumbar radiculitis    Malodorous urine    Muscle weakness    Obesity    Oxygen dependent    Pneumonia due to COVID-19 virus 02/03/2020   Renal cyst    Sensory urge incontinence    Thyroid activity decreased    9/19   Tobacco abuse    Wheezing     SURGICAL HISTORY: Past Surgical History:  Procedure Laterality Date   ABDOMINAL HYSTERECTOMY     CARDIOVERSION N/A 04/29/2022   Procedure: CARDIOVERSION;  Surgeon: Lamar Blinks, MD;  Location: [facility] ORS;  Service: Cardiovascular;  Laterality: N/A;   CARPAL TUNNEL RELEASE Left 2012   cataract surgery Bilateral    EPIGASTRIC HERNIA REPAIR N/A 08/13/2018  HERNIA REPAIR EPIGASTRIC ADULT; polypropylene mesh reinforcement.  Surgeon: Earline Mayotte, MD;  Location: [facility] ORS;  Service: General;  Laterality: N/A;   ESOPHAGOGASTRODUODENOSCOPY N/A 06/21/2022   Procedure: ESOPHAGOGASTRODUODENOSCOPY (EGD);  Surgeon: Toledo, Boykin Nearing, MD;  Location: [facility] ENDOSCOPY;  Service: Gastroenterology;  Laterality: N/A;   RIGHT/LEFT HEART CATH AND CORONARY ANGIOGRAPHY N/A 07/11/2019   Procedure: RIGHT/LEFT HEART CATH AND CORONARY ANGIOGRAPHY;  Surgeon: Alwyn Pea, MD;  Location: [facility] INVASIVE CV [facility];  Service: Cardiovascular;  Laterality: N/A;   TEE WITHOUT CARDIOVERSION N/A  04/29/2022   Procedure: TRANSESOPHAGEAL ECHOCARDIOGRAM (TEE);  Surgeon: Lamar Blinks, MD;  Location: [facility] ORS;  Service: Cardiovascular;  Laterality: N/A;   TONSILLECTOMY     TUBAL LIGATION     UMBILICAL HERNIA REPAIR N/A 08/13/2018   HERNIA REPAIR UMBILICAL ADULT; polypropylene mesh reinforcement Surgeon: Earline Mayotte, MD;  Location: [facility] ORS;  Service: General;  Laterality: N/A;    SOCIAL HISTORY: Social History   Socioeconomic History   Marital status: Married    Spouse name: Harrold Donath   Number of children: 2   Years of education: Not on file   Highest education level: 12th grade  Occupational History   Occupation: disability  Tobacco Use   Smoking status: Some Days    Current packs/day: 1.50    Average packs/day: 1.5 packs/day for 44.0 years (66.0 ttl pk-yrs)    Types: Cigarettes   Smokeless tobacco: Never   Tobacco comments:    over a half pack  Vaping Use   Vaping status: Never Used  Substance and Sexual Activity   Alcohol use: No   Drug use: Yes    Comment: prescribed hydrocodone   Sexual activity: Not Currently    Birth control/protection: Surgical  Other Topics Concern   Not on file  Social History Narrative   ** Merged History Encounter **       Lives with spouse    Social Determinants of Health   Financial Resource Strain: Low Risk  (04/27/2023)   Received from [facility]   Overall Financial Resource Strain (CARDIA)    Difficulty of Paying Living Expenses: Not very hard  Food Insecurity: No Food Insecurity (04/27/2023)   Received from [facility]   Hunger Vital Sign    Worried About Running Out of Food in the Last Year: Never true    Ran Out of Food in the Last Year: Never true  Transportation Needs: No Transportation Needs (04/27/2023)   Received from [facility]    In the past 12 months, has lack of transportation kept you from medical appointments or from  getting medications?: No    Lack of Transportation (Non-Medical): No  Physical Activity: Inactive (08/24/2022)   Received [facility], [facility]   Exercise Vital Sign    Days of Exercise per Week: 0 days    Minutes of Exercise per Session: 0 min  Stress: Stress Concern Present (08/24/2022)   Received from [facility], [facility]   [employer] of Occupational [facility] - Occupational Stress Questionnaire    Feeling of Stress : Very much  Social Connections: Socially Integrated (08/24/2022)   Received from [facility], [facility]   Social Connection and Isolation Panel [NHANES]    Frequency of Communication with Friends and Family: More than three times a week    Frequency of Social Gatherings with Friends  and Family: Never    Attends Religious Services: More than 4 times per year    Active Member of Clubs or Organizations: Yes    Attends Banker Meetings: More than 4 times per year    Marital Status: Married  Catering manager Violence: Not At Risk (11/08/2022)   Humiliation, Afraid, Rape, and Kick questionnaire    Fear of Current or Ex-Partner: No    Emotionally Abused: No    Physically Abused: No    Sexually Abused: No    FAMILY HISTORY: Family History  Problem Relation Age of Onset   Heart disease Mother    Stroke Mother    Coronary artery disease Mother    Alcohol abuse Father    Heart disease Father    Lung cancer Sister    Cancer Sister    Breast cancer Sister    Cancer Brother    Cancer Brother    Kidney cancer Brother    Pneumonia Brother    Prostate cancer Neg Hx    Bladder Cancer Neg Hx     ALLERGIES:  is allergic to gabapentin, codeine, and meloxicam.  MEDICATIONS:  Current Outpatient Medications  Medication Sig Dispense Refill   acetaminophen (TYLENOL) 500 MG tablet Take 1,000 mg by mouth every 8 (eight) hours as needed for mild  pain or headache.     albuterol (VENTOLIN HFA) 108 (90 Base) MCG/ACT inhaler Inhale 2 puffs into the lungs every 6 (six) hours as needed for wheezing or shortness of breath. 8 g 1   amiodarone (PACERONE) 200 MG tablet Take 1 tablet (200 mg total) by mouth daily. 30 tablet 0   busPIRone (BUSPAR) 5 MG tablet TAKE 1 TABLET BY MOUTH TWICE DAILY 180 tablet 1   butalbital-acetaminophen-caffeine (FIORICET) 50-325-40 MG tablet Take 1 tablet by mouth every 6 (six) hours as needed for headache. LIMIT USE TO PREVENT MEDICATION OVERUSE HEADACHE 14 tablet 0   Cholecalciferol 50 MCG (2000 UT) CAPS Take 2,000 Units by mouth daily.      diclofenac Sodium (VOLTAREN) 1 % GEL Apply 2 g topically 4 (four) times daily as needed (lower back and/or shoulder pain).     diltiazem (CARDIZEM CD) 180 MG 24 hr capsule Take 180 mg by mouth daily.     FARXIGA 10 MG TABS tablet TAKE 1 TABLET BY MOUTH ONCE DAILY BEFOREBREAKFAST 30 tablet 5   Fluticasone-Umeclidin-Vilant 100-62.5-25 MCG/INH AEPB Inhale 1 puff into the lungs daily.     FUROSCIX 80 MG/10ML CTKT Inject into the skin.     HYDROcodone-acetaminophen (NORCO/VICODIN) 5-325 MG tablet Take 1 tablet by mouth every 8 (eight) hours as needed for severe pain. Must last 30 days 90 tablet 0   HYDROcodone-acetaminophen (NORCO/VICODIN) 5-325 MG tablet Take 1 tablet by mouth every 8 (eight) hours as needed for severe pain. Must last 30 days 90 tablet 0   [START ON 06/30/2023] HYDROcodone-acetaminophen (NORCO/VICODIN) 5-325 MG tablet Take 1 tablet by mouth every 8 (eight) hours as needed for severe pain. Must last 30 days 90 tablet 0   ipratropium-albuterol (DUONEB) 0.5-2.5 (3) MG/3ML SOLN USE 1 VIAL VIA NEBULIZER EVERY 6 HOURS AS NEEDED FOR SHORTNESS OF BREATH OR WHEEZING 1080 mL 0   melatonin 5 MG TABS Take 5 mg by mouth at bedtime.     naloxone (NARCAN) nasal spray 4 mg/0.1 mL Place 1 spray into the nose as needed for up to 365 doses (for opioid-induced respiratory depresssion). In  case of emergency (overdose), spray once into each nostril.  If no response within 3 minutes, repeat application and call 911. 1 each 0   omeprazole (PRILOSEC) 40 MG capsule Take 40 mg by mouth daily.     polyethylene glycol (MIRALAX / GLYCOLAX) 17 g packet Take 17 g by mouth daily as needed for mild constipation or moderate constipation.     potassium chloride SA (KLOR-CON M) 20 MEQ tablet Take 2 tablets every morning and 1 tablet every evening (Patient taking differently: 40 mEq 2 (two) times daily.) 270 tablet 3   roflumilast (DALIRESP) 500 MCG TABS tablet Take 500 mcg by mouth daily.     Semaglutide-Weight Management (WEGOVY) 0.25 MG/0.5ML SOAJ Inject 0.25 mg into the skin once a week. 2 mL 5   sertraline (ZOLOFT) 100 MG tablet Take 2 tablets (200 mg total) by mouth daily. Please schedule office visit before any future refill. 60 tablet 0   simvastatin (ZOCOR) 20 MG tablet Take 20 mg by mouth at bedtime.     spironolactone (ALDACTONE) 25 MG tablet TAKE 1 TABLET BY MOUTH ONCE DAILY (Patient taking differently: Take 12.5 mg by mouth daily.) 30 tablet 5   torsemide (DEMADEX) 20 MG tablet Take 40-80 mg by mouth See admin instructions. Take 4 tablets (80mg ) by mouth every morning and take 2 tablets (40mg ) by mouth every afternoon     No current facility-administered medications for this visit.    Review of Systems  Constitutional:  Positive for fatigue. Negative for chills and fever.  HENT:   Negative for hearing loss and voice change.   Eyes:  Negative for eye problems.  Respiratory:  Positive for shortness of breath. Negative for chest tightness and cough.   Cardiovascular:  Negative for chest pain.  Gastrointestinal:  Negative for abdominal distention, abdominal pain and blood in stool.  Endocrine: Negative for hot flashes.  Genitourinary:  Negative for difficulty urinating and frequency.   Musculoskeletal:  Negative for arthralgias.  Skin:  Negative for itching and rash.  Neurological:   Negative for extremity weakness.  Hematological:  Negative for adenopathy. Does not bruise/bleed easily.  Psychiatric/Behavioral:  Negative for confusion.     PHYSICAL EXAMINATION: Vitals:   06/21/23 1321  BP: (!) 140/73  Pulse: 73  Resp: 18  Temp: (!) 97.1 F (36.2 C)  SpO2: 98%   Filed Weights   06/21/23 1321  Weight: 216 lb 6.4 oz (98.2 kg)    Physical Exam Constitutional:      General: She is not in acute distress.    Appearance: She is obese.     Comments: Patient sits in the wheelchair  HENT:     Head: Normocephalic and atraumatic.  Eyes:     General: No scleral icterus. Cardiovascular:     Rate and Rhythm: Normal rate.  Pulmonary:     Effort: Pulmonary effort is normal. No respiratory distress.     Breath sounds: No wheezing.     Comments: Patient breathes comfortably via nasal cannula oxygen.  Decreased breath sound bilaterally Abdominal:     General: Bowel sounds are normal. There is no distension.     Palpations: Abdomen is soft.  Musculoskeletal:        General: No deformity. Normal range of motion.     Cervical back: Normal range of motion and neck supple.  Skin:    General: Skin is warm and dry.     Findings: No erythema or rash.  Neurological:     Mental Status: She is alert and oriented to person, place, and time.  Mental status is at baseline.     Cranial Nerves: No cranial nerve deficit.  Psychiatric:        Mood and Affect: Mood normal.      LABORATORY DATA:  I have reviewed the data as listed    Latest Ref Rng & Units 06/14/2023   11:20 AM 12/12/2022   11:19 AM 11/09/2022   10:33 AM  CBC  WBC 4.0 - 10.5 K/uL 12.7  8.9  13.2   Hemoglobin 12.0 - 15.0 g/dL 13.0  86.5  8.5   Hematocrit 36.0 - 46.0 % 45.4  47.2  32.2   Platelets 150 - 400 K/uL 234  251  277       Latest Ref Rng & Units 06/05/2023    2:30 PM 11/08/2022    4:39 AM 11/07/2022   11:37 AM  CMP  Glucose 70 - 99 mg/dL 98  784  98   BUN 8 - 27 mg/dL 19  12  13    Creatinine  0.57 - 1.00 mg/dL 6.96  2.95  2.84   Sodium 134 - 144 mmol/L 140  139  143   Potassium 3.5 - 5.2 mmol/L 3.8  3.9  3.4   Chloride 96 - 106 mmol/L 96  102  103   CO2 20 - 29 mmol/L 27  31  30    Calcium 8.7 - 10.3 mg/dL 9.8  8.6  8.9       Component Value Date/Time   IRON 65 06/14/2023 1120   TIBC 459 (H) 06/14/2023 1120   FERRITIN 48 06/14/2023 1120   IRONPCTSAT 14 06/14/2023 1120     RADIOGRAPHIC STUDIES: I have personally reviewed the radiological images as listed and agreed with the findings in the report. DG PAIN CLINIC C-ARM 1-60 MIN NO REPORT  Result Date: 06/06/2023 Fluoro was used, but no Radiologist interpretation will be provided. Please refer to "NOTES" tab for provider progress note.

## 2023-06-22 ENCOUNTER — Encounter: Payer: Self-pay | Admitting: Pain Medicine

## 2023-06-22 ENCOUNTER — Ambulatory Visit
Admission: RE | Admit: 2023-06-22 | Discharge: 2023-06-22 | Disposition: A | Discharge status: Home / Self Care | Payer: Medicare HMO | Source: Ambulatory Visit | Attending: Pain Medicine | Admitting: Pain Medicine

## 2023-06-22 ENCOUNTER — Ambulatory Visit (HOSPITAL_BASED_OUTPATIENT_CLINIC_OR_DEPARTMENT_OTHER): Payer: Medicare HMO | Admitting: Pain Medicine

## 2023-06-22 VITALS — BP 114/70 | HR 76 | Temp 98.0°F | Resp 14 | Ht 62.0 in | Wt 212.0 lb

## 2023-06-22 DIAGNOSIS — M7061 Trochanteric bursitis, right hip: Secondary | ICD-10-CM | POA: Insufficient documentation

## 2023-06-22 DIAGNOSIS — G8929 Other chronic pain: Secondary | ICD-10-CM

## 2023-06-22 DIAGNOSIS — Z09 Encounter for follow-up examination after completed treatment for conditions other than malignant neoplasm: Secondary | ICD-10-CM | POA: Insufficient documentation

## 2023-06-22 DIAGNOSIS — M5416 Radiculopathy, lumbar region: Secondary | ICD-10-CM | POA: Insufficient documentation

## 2023-06-22 DIAGNOSIS — M7918 Myalgia, other site: Secondary | ICD-10-CM

## 2023-06-22 DIAGNOSIS — M79605 Pain in left leg: Secondary | ICD-10-CM | POA: Insufficient documentation

## 2023-06-22 DIAGNOSIS — M25551 Pain in right hip: Secondary | ICD-10-CM | POA: Insufficient documentation

## 2023-06-22 DIAGNOSIS — M160 Bilateral primary osteoarthritis of hip: Secondary | ICD-10-CM

## 2023-06-22 DIAGNOSIS — R1031 Right lower quadrant pain: Secondary | ICD-10-CM | POA: Insufficient documentation

## 2023-06-22 NOTE — Progress Notes (Signed)
Safety precautions to be maintained throughout the outpatient stay will include: orient to surroundings, keep bed in low position, maintain call bell within reach at all times, provide assistance with transfer out of bed and ambulation.  

## 2023-06-22 NOTE — Patient Instructions (Signed)
 ______________________________________________________________________    Procedure instructions  Stop blood-thinners  Do not eat or drink fluids (other than water) for 6 hours before your procedure  No water for 2 hours before your procedure  Take your blood pressure medicine with a sip of water  Arrive 30 minutes before your appointment  If sedation is planned, bring suitable driver. Pennie Banter, Benedetto Goad, & public transportation are NOT APPROVED)  Carefully read the "Preparing for your procedure" detailed instructions  If you have questions call us at [phone]  ______________________________________________________________________      ______________________________________________________________________    Preparing for your procedure  Appointments: If you think you may not be able to keep your appointment, call 24-48 hours in advance to cancel. We need time to make it available to others.  During your procedure appointment there will be: No Prescription Refills. No disability issues to discussed. No medication changes or discussions.  Instructions: Food intake: Avoid eating anything solid for at least 8 hours prior to your procedure. Clear liquid intake: You may take clear liquids such as water up to 2 hours prior to your procedure. (No carbonated drinks. No soda.) Transportation: Unless otherwise stated by your physician, bring a driver. (Driver cannot be a Market researcher, Pharmacist, community, or any other form of public transportation.) Morning Medicines: Except for blood thinners, take all of your other morning medications with a sip of water. Make sure to take your heart and blood pressure medicines. If your blood pressure's lower number is above 100, the case will be rescheduled. Blood thinners: Make sure to stop your blood thinners as instructed.  If you take a blood thinner, but were not instructed to stop it, call our office [phone] and ask to talk to a nurse. Not stopping a blood  thinner prior to certain procedures could lead to serious complications. Diabetics on insulin: Notify the staff so that you can be scheduled 1st case in the morning. If your diabetes requires high dose insulin, take only  of your normal insulin dose the morning of the procedure and notify the staff that you have done so. Preventing infections: Shower with an antibacterial soap the morning of your procedure.  Build-up your immune system: Take 1000 mg of Vitamin C with every meal (3 times a day) the day prior to your procedure. Antibiotics: Inform the nursing staff if you are taking any antibiotics or if you have any conditions that may require antibiotics prior to procedures. (Example: recent joint implants)   Pregnancy: If you are pregnant make sure to notify the nursing staff. Not doing so may result in injury to the fetus, including death.  Sickness: If you have a cold, fever, or any active infections, call and cancel or reschedule your procedure. Receiving steroids while having an infection may result in complications. Arrival: You must be in the facility at least 30 minutes prior to your scheduled procedure. Tardiness: Your scheduled time is also the cutoff time. If you do not arrive at least 15 minutes prior to your procedure, you will be rescheduled.  Children: Do not bring any children with you. Make arrangements to keep them home. Dress appropriately: There is always a possibility that your clothing may get soiled. Avoid long dresses. Valuables: Do not bring any jewelry or valuables.  Reasons to call and reschedule or cancel your procedure: (Following these recommendations will minimize the risk of a serious complication.) Surgeries: Avoid having procedures within 2 weeks of any surgery. (Avoid for 2 weeks before or after any surgery). Flu Shots:  Avoid having procedures within 2 weeks of a flu shots or . (Avoid for 2 weeks before or after immunizations). Barium: Avoid having a procedure  within 7-10 days after having had a radiological study involving the use of radiological contrast. (Myelograms, Barium swallow or enema study). Heart attacks: Avoid any elective procedures or surgeries for the initial 6 months after a "Myocardial Infarction" (Heart Attack). Blood thinners: It is imperative that you stop these medications before procedures. Let us know if you if you take any blood thinner.  Infection: Avoid procedures during or within two weeks of an infection (including chest colds or gastrointestinal problems). Symptoms associated with infections include: Localized redness, fever, chills, night sweats or profuse sweating, burning sensation when voiding, cough, congestion, stuffiness, runny nose, sore throat, diarrhea, nausea, vomiting, cold or Flu symptoms, recent or current infections. It is specially important if the infection is over the area that we intend to treat. Heart and lung problems: Symptoms that may suggest an active cardiopulmonary problem include: cough, chest pain, breathing difficulties or shortness of breath, dizziness, ankle swelling, uncontrolled high or unusually low blood pressure, and/or palpitations. If you are experiencing any of these symptoms, cancel your procedure and contact your primary care physician for an evaluation.  Remember:  Regular Business hours are:  Monday to Thursday 8:00 AM to 4:00 PM  Provider's Schedule: Delano Metz, MD:  Procedure days: Tuesday and Thursday 7:30 AM to 4:00 PM  Edward Jolly, MD:  Procedure days: Monday and Wednesday 7:30 AM to 4:00 PM Last  Updated: 06/06/2023 ______________________________________________________________________      ______________________________________________________________________    General Risks and Possible Complications  Patient Responsibilities: It is important that you read this as it is part of your informed consent. It is our duty to inform you of the risks and possible  complications associated with treatments offered to you. It is your responsibility as a patient to read this and to ask questions about anything that is not clear or that you believe was not covered in this document.  Patient's Rights: You have the right to refuse treatment. You also have the right to change your mind, even after initially having agreed to have the treatment done. However, under this last option, if you wait until the last second to change your mind, you may be charged for the materials used up to that point.  Introduction: Medicine is not an Visual merchandiser. Everything in Medicine, including the lack of treatment(s), carries the potential for danger, harm, or loss (which is by definition: Risk). In Medicine, a complication is a secondary problem, condition, or disease that can aggravate an already existing one. All treatments carry the risk of possible complications. The fact that a side effects or complications occurs, does not imply that the treatment was conducted incorrectly. It must be clearly understood that these can happen even when everything is done following the highest safety standards.  No treatment: You can choose not to proceed with the proposed treatment alternative. The "PRO(s)" would include: avoiding the risk of complications associated with the therapy. The "CON(s)" would include: not getting any of the treatment benefits. These benefits fall under one of three categories: diagnostic; therapeutic; and/or palliative. Diagnostic benefits include: getting information which can ultimately lead to improvement of the disease or symptom(s). Therapeutic benefits are those associated with the successful treatment of the disease. Finally, palliative benefits are those related to the decrease of the primary symptoms, without necessarily curing the condition (example: decreasing the pain from a flare-up  of a chronic condition, such as incurable terminal cancer).  General Risks and  Complications: These are associated to most interventional treatments. They can occur alone, or in combination. They fall under one of the following six (6) categories: no benefit or worsening of symptoms; bleeding; infection; nerve damage; allergic reactions; and/or death. No benefits or worsening of symptoms: In Medicine there are no guarantees, only probabilities. No healthcare provider can ever guarantee that a medical treatment will work, they can only state the probability that it may. Furthermore, there is always the possibility that the condition may worsen, either directly, or indirectly, as a consequence of the treatment. Bleeding: This is more common if the patient is taking a blood thinner, either prescription or over the counter (example: Goody Powders, Fish oil, Aspirin, Garlic, etc.), or if suffering a condition associated with impaired coagulation (example: [name], cirrhosis of the liver, low platelet counts, etc.). However, even if you do not have one on these, it can still happen. If you have any of these conditions, or take one of these drugs, make sure to notify your treating physician. Infection: This is more common in patients with a compromised immune system, either due to disease (example: diabetes, cancer, human immunodeficiency virus [HIV], etc.), or due to medications or treatments (example: therapies used to treat cancer and rheumatological diseases). However, even if you do not have one on these, it can still happen. If you have any of these conditions, or take one of these drugs, make sure to notify your treating physician. Nerve Damage: This is more common when the treatment is an invasive one, but it can also happen with the use of medications, such as those used in the treatment of cancer. The damage can occur to small secondary nerves, or to large primary ones, such as those in the spinal cord and brain. This damage may be temporary or permanent and it may lead to  impairments that can range from temporary numbness to permanent paralysis and/or brain death. Allergic Reactions: Any time a substance or material comes in contact with our body, there is the possibility of an allergic reaction. These can range from a mild skin rash (contact dermatitis) to a severe systemic reaction (anaphylactic reaction), which can result in death. Death: In general, any medical intervention can result in death, most of the time due to an unforeseen complication. ______________________________________________________________________

## 2023-06-23 DIAGNOSIS — J9610 Chronic respiratory failure, unspecified whether with hypoxia or hypercapnia: Secondary | ICD-10-CM | POA: Diagnosis not present

## 2023-06-26 NOTE — Group Note (Deleted)
[facility] LCSW Group Therapy Note    Group Date: 06/26/2023 Start Time:  2:15 PM End Time:  3:00 PM  Type of Therapy and Topic:  Group Therapy:  Overcoming Obstacles  Participation Level:  {[facility] PARTICIPATION LEVEL:22264:::1}  Mood:  Description of Group:   In this group patients will be encouraged to explore what they see as obstacles to their own wellness and recovery. They will be guided to discuss their thoughts, feelings, and behaviors related to these obstacles. The group will process together ways to cope with barriers, with attention given to specific choices patients can make. Each patient will be challenged to identify changes they are motivated to make in order to overcome their obstacles. This group will be process-oriented, with patients participating in exploration of their own experiences as well as giving and receiving support and challenge from other group members.  Therapeutic Goals: 1. Patient will identify personal and current obstacles as they relate to admission. 2. Patient will identify barriers that currently interfere with their wellness or overcoming obstacles.  3. Patient will identify feelings, thought process and behaviors related to these barriers. 4. Patient will identify two changes they are willing to make to overcome these obstacles:    Summary of Patient Progress   ***   Therapeutic Modalities:   Cognitive Behavioral Therapy Solution Focused Therapy Motivational Interviewing Relapse Prevention Therapy   Harden Mo, LCSW

## 2023-06-27 DIAGNOSIS — Z79899 Other long term (current) drug therapy: Secondary | ICD-10-CM | POA: Diagnosis not present

## 2023-07-09 DIAGNOSIS — J449 Chronic obstructive pulmonary disease, unspecified: Secondary | ICD-10-CM | POA: Diagnosis not present

## 2023-07-13 DIAGNOSIS — Z1211 Encounter for screening for malignant neoplasm of colon: Secondary | ICD-10-CM | POA: Diagnosis not present

## 2023-07-21 ENCOUNTER — Other Ambulatory Visit: Payer: Self-pay | Admitting: Family Medicine

## 2023-07-21 DIAGNOSIS — F411 Generalized anxiety disorder: Secondary | ICD-10-CM

## 2023-07-21 DIAGNOSIS — I071 Rheumatic tricuspid insufficiency: Secondary | ICD-10-CM | POA: Insufficient documentation

## 2023-07-21 DIAGNOSIS — F1110 Opioid abuse, uncomplicated: Secondary | ICD-10-CM | POA: Insufficient documentation

## 2023-07-23 DIAGNOSIS — J9610 Chronic respiratory failure, unspecified whether with hypoxia or hypercapnia: Secondary | ICD-10-CM | POA: Diagnosis not present

## 2023-07-24 NOTE — Progress Notes (Unsigned)
PROVIDER NOTE: Information contained herein reflects review and annotations entered in association with encounter. Interpretation of such information and data should be left to medically-trained personnel. Information provided to patient can be located elsewhere in the medical record under "Patient Instructions". Document created using STT-dictation technology, any transcriptional errors that may result from process are unintentional.    Patient: Theresa [name]  Service Category: E/M  Provider: Oswaldo Done, MD  DOB: August 30, 1956  DOS: 07/26/2023  Referring Provider: Dorothey Baseman, MD  MRN: [id number]  Specialty: Interventional Pain Management  PCP: Leanord Asal, Nelva Bush, MD  Type: Established Patient  Setting: Ambulatory outpatient    Location: Office  Delivery: Face-to-face     HPI  [name], a 67 y.o. year old female, is here today because of her No primary diagnosis found.. Theresa [name] primary complain today is No chief complaint on file.  Pertinent problems: Theresa [name] has Neuritis or radiculitis due to rupture of lumbar intervertebral disc; DDD (degenerative disc disease), lumbar; Carpal tunnel syndrome; Neurogenic pain; Neuropathic pain; Musculoskeletal pain; Lumbar spondylosis (L4-5); Chronic low back pain (1ry area of Pain) (Bilateral) (R>L) w/o sciatica; Chronic lower extremity pain (2ry area of Pain) (Left); Chronic lumbar radicular pain (L4 & S1 Dermatome) (Left); Chronic shoulder pain (Right side); Chronic upper extremity pain (Right-sided); Cervical radiculitis (Right side); Abnormal x-ray of lumbar spine; Chronic pain syndrome (significant psychosocial component); 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; 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); Lumbar facet syndrome (Bilateral) (R>L); Chronic sacroiliac joint pain (Bilateral); Lumbar facet hypertrophy (L3-4, L5-S1) (Bilateral); Lumbar spinal stenosis (L4-5); Lumbar foraminal stenosis (L4-5)  (Left); 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); Lumbosacral radiculopathy at L4; Chronic shoulder pain (Left); Arthralgia of acromioclavicular joint (Right); Acromioclavicular joint DJD (Right); Osteoarthritis of shoulder (Right); Chronic hip pain (3ry area of Pain) (Bilateral) (L>R); Chronic lower extremity pain (Bilateral) (R>L); Cervicalgia (Bilateral) (R>L); Spondylosis without myelopathy or radiculopathy, lumbosacral region; Chronic knee pain (Left); Chronic left-sided lumbar radiculopathy; Abnormal MRI, lumbar spine (12/09/2021); Lumbar disc extrusion (L2-3) (Left); Chronic shoulder pain (Bilateral); Abnormal MRI, cervical spine; Trigger point with back pain; Lumbar trigger point syndrome; Lumbar facet joint pain; Grade 1 Anterolisthesis of lumbar spine (4mm) (L3/L4); Grade 1 Retrolisthesis (2mm) (L5/S1); Chronic hip pain (Right); Chronic groin pain (Right); Buttock pain (Right); and Greater trochanteric bursitis (Right) on their pertinent problem list. Pain Assessment: Severity of   is reported as a  /10. Location:    / . Onset:  . Quality:  . Timing:  . Modifying factor(s):  Marland Kitchen Vitals:  vitals were not taken for this visit.  BMI: Estimated body mass index is 38.78 kg/m as calculated from the following:   Height as of 06/22/23: 5\' 2"  (1.575 m).   Weight as of 06/22/23: 212 lb (96.2 kg). Last encounter: 06/22/2023. Last procedure: 06/06/2023.  Reason for encounter: medication management. ***  Pharmacotherapy Assessment  Analgesic: Hydrocodone/APAP 5/325 one tablet every 8 hours (15 mg/day of hydrocodone) MME/day: 15 mg/day.   Monitoring: [place] PMP: PDMP reviewed during this encounter.       Pharmacotherapy: No side-effects or adverse reactions reported. Compliance: No problems identified. Effectiveness: Clinically acceptable.  No notes on file  No results found for: "CBDTHCR" No results found for: "D8THCCBX" No results found for: "D9THCCBX"  UDS:  Summary  Date Value  Ref Range Status  10/26/2022 Note  Final    Comment:    ==================================================================== [employer] Select 13 (MW) ==================================================================== Specimen Alert Note: Urinary creatinine is low;  ability to detect some drugs may be compromised. Interpret results with caution. (Creatinine) ==================================================================== Test                             Result       Flag       Units  Drug Present and Declared for Prescription Verification   Hydrocodone                    3780         EXPECTED   ng/mg creat   Norhydrocodone                 1400         EXPECTED   ng/mg creat    Sources of hydrocodone include scheduled prescription medications.    Norhydrocodone is an expected metabolite of hydrocodone.  Drug Absent but Declared for Prescription Verification   Butalbital                     Not Detected UNEXPECTED ==================================================================== Test                      Result    Flag   Units      Ref Range   Creatinine              10        LL     mg/dL      >=47 ==================================================================== Declared Medications:  The flagging and interpretation on this report are based on the  following declared medications.  Unexpected results may arise from  inaccuracies in the declared medications.   **Note: The testing scope of this panel includes these medications:   Butalbital (Fioricet)  Hydrocodone (Norco)   **Note: The testing scope of this panel does not include the  following reported medications:   Acetaminophen (Tylenol)  Acetaminophen (Fioricet)  Acetaminophen (Norco)  Albuterol (Ventolin HFA)  Albuterol (Duoneb)  Amiodarone  Buspirone (Buspar)  Caffeine (Fioricet)  Cholecalciferol  Dapagliflozin (Farxiga)  Diclofenac (Voltaren)  Diltiazem (Cardizem)  Fluticasone (Trelegy)  Helium   Ipratropium (Duoneb)  Iron  Metolazone (Zaroxolyn)  Naloxone (Narcan)  Oxygen  Pantoprazole (Protonix)  Potassium (Klor-Con)  Rivaroxaban (Xarelto)  Roflumilast (Daliresp)  Sertraline (Zoloft)  Simvastatin (Zocor)  Spironolactone (Aldactone)  Torsemide (Demadex)  Umeclidinium (Trelegy)  Varenicline (Chantix)  Vilanterol (Trelegy) ==================================================================== For clinical consultation, please call [phone]. ====================================================================       ROS  Constitutional: Denies any fever or chills Gastrointestinal: No reported hemesis, hematochezia, vomiting, or acute GI distress Musculoskeletal: Denies any acute onset joint swelling, redness, loss of ROM, or weakness Neurological: No reported episodes of acute onset apraxia, aphasia, dysarthria, agnosia, amnesia, paralysis, loss of coordination, or loss of consciousness  Medication Review  Cholecalciferol, Fluticasone-Umeclidin-Vilant, Furosemide, HYDROcodone-acetaminophen, Semaglutide-Weight Management, acetaminophen, albuterol, amiodarone, busPIRone, butalbital-acetaminophen-caffeine, dapagliflozin propanediol, diclofenac Sodium, diltiazem, ipratropium-albuterol, melatonin, naloxone, omeprazole, polyethylene glycol, potassium chloride SA, roflumilast, sertraline, spironolactone, and torsemide  History Review  Allergy: Theresa [name] is allergic to gabapentin, codeine, and meloxicam. Drug: Theresa [name]  reports current drug use. Alcohol:  reports no history of alcohol use. Tobacco:  reports that she has quit smoking. Her smoking use included cigarettes. She has a 66 pack-year smoking history. She has never used smokeless tobacco. Social: Theresa [name]  reports that she has quit smoking. Her smoking use included cigarettes. She has a 66 pack-year smoking history. She has never used  smokeless tobacco. She reports current drug use. She reports that she does not  drink alcohol. Medical:  has a past medical history of Acute postoperative pain (10/03/2017), Anxiety, BiPAP (biphasic positive airway pressure) dependence, Carpal tunnel syndrome, CHF (congestive heart failure) (HCC), CHF (congestive heart failure) (HCC), Chronic generalized abdominal pain, Chronic rhinitis, COPD (chronic obstructive pulmonary disease) (HCC), DDD (degenerative disc disease), cervical, DDD (degenerative disc disease), lumbosacral, Depression, Dyspnea, Edema, Flu, Gastritis, GERD (gastroesophageal reflux disease), Hematuria, Hernia of abdominal wall (07/17/2018), Hernia, abdominal, Hypertension, Kidney stones, Low back pain, Lumbar radiculitis, Malodorous urine, Muscle weakness, Obesity, Oxygen dependent, Pneumonia due to COVID-19 virus (02/03/2020), Renal cyst, Sensory urge incontinence, Thyroid activity decreased, Tobacco abuse, and Wheezing. Surgical: Theresa [name]  has a past surgical history that includes Abdominal hysterectomy; Tonsillectomy; Carpal tunnel release (Left, 2012); Tubal ligation; epigastric hernia repair (N/A, 08/13/2018); Umbilical hernia repair (N/A, 08/13/2018); RIGHT/LEFT HEART CATH AND CORONARY ANGIOGRAPHY (N/A, 07/11/2019); TEE without cardioversion (N/A, 04/29/2022); Cardioversion (N/A, 04/29/2022); Esophagogastroduodenoscopy (N/A, 06/21/2022); and cataract surgery (Bilateral). Family: family history includes Alcohol abuse in her father; Breast cancer in her sister; Cancer in her brother, brother, and sister; Coronary artery disease in her mother; Heart disease in her father and mother; Kidney cancer in her brother; Lung cancer in her sister; Pneumonia in her brother; Stroke in her mother.  Laboratory Chemistry Profile   Renal Lab Results  Component Value Date   BUN 19 06/05/2023   CREATININE 0.97 06/05/2023   BCR 20 06/05/2023   GFRAA >60 06/26/2020   GFRNONAA >60 11/08/2022    Hepatic Lab Results  Component Value Date   AST 23 11/05/2022   ALT 18  11/05/2022   ALBUMIN 3.5 11/05/2022   ALKPHOS 82 11/05/2022   LIPASE 57 (H) 06/19/2022   AMMONIA 40 (H) 10/11/2019    Electrolytes Lab Results  Component Value Date   NA 140 06/05/2023   K 3.8 06/05/2023   CL 96 06/05/2023   CALCIUM 9.8 06/05/2023   MG 2.8 (H) 11/08/2022   PHOS 3.2 06/21/2022    Bone Lab Results  Component Value Date   25OHVITD1 13 (L) 09/17/2015   25OHVITD2 <1.0 09/17/2015   25OHVITD3 13 09/17/2015    Inflammation (CRP: Acute Phase) (ESR: Chronic Phase) Lab Results  Component Value Date   CRP 0.9 04/02/2022   ESRSEDRATE 17 04/06/2022   LATICACIDVEN 0.9 04/01/2022         Note: Above Lab results reviewed.  Recent Imaging Review  DG HIP UNILAT W OR W/O PELVIS 2-3 VIEWS RIGHT CLINICAL DATA:  Pain.  EXAM: DG HIP (WITH OR WITHOUT PELVIS) 3V RIGHT  COMPARISON:  02/06/2020.  FINDINGS: There is no evidence of hip fracture or dislocation. Pelvic ring is intact. There is no evidence of arthropathy or other focal bone abnormality.  IMPRESSION: Negative.  Electronically Signed   By: Layla Maw M.D.   On: 07/04/2023 15:29 Note: Reviewed        Physical Exam  General appearance: Well nourished, well developed, and well hydrated. In no apparent acute distress Mental status: Alert, oriented x 3 (person, place, & time)       Respiratory: No evidence of acute respiratory distress Eyes: PERLA Vitals: There were no vitals taken for this visit. BMI: Estimated body mass index is 38.78 kg/m as calculated from the following:   Height as of 06/22/23: 5\' 2"  (1.575 m).   Weight as of 06/22/23: 212 lb (96.2 kg). Ideal: Patient weight not recorded  Assessment   Diagnosis Status  No diagnosis found. Controlled Controlled Controlled   Updated Problems: No problems updated.  Plan of Care  Problem-specific:  No problem-specific Assessment & Plan notes found for this encounter.  [name] has a current medication list which includes the  following long-term medication(s): albuterol, amiodarone, hydrocodone-acetaminophen, hydrocodone-acetaminophen, ipratropium-albuterol, potassium chloride sa, sertraline, and spironolactone.  Pharmacotherapy (Medications Ordered): No orders of the defined types were placed in this encounter.  Orders:  No orders of the defined types were placed in this encounter.  Follow-up plan:   No follow-ups on file.      Interventional Therapies  Risk Factors  Considerations  Medical Comorbidities:  COPD  Emphysema  Chronic Smoker  O2 dependent  SOB  CHF  Hx. of A-Fib (Cardioverted)  Obesity  CAD   NOTE: NO further Lumbar RFA until BMI <35. Patient taken off of Xarelto after cardioversion.   Planned  Pending:   Diagnostic/therapeutic right IA hip and trochanteric bursa inj. #1    Under consideration:   Diagnostic bilateral cervical facet MBB #1  Palliative left L2-3 LESI #4  NOTE: NO further Lumbar RFA until BMI <35.   Completed:   Palliative right lumbar facet MBB x7 (09/20/2022) (100/100/100x5days/0)  Palliative left lumbar facet MBB x6 (11/11/2021) (100/100/0/0)  Therapeutic left L2-3 LESI x4 (05/09/2023) (8-0/10) (100/100/50/>50)  Diagnostic/palliative left L4 TFESI x1 (12/26/2017) (100/100/50/>50)  Diagnostic/palliative left L4-5 LESI x2 (07/30/2019) (100/100/0/0)  Diagnostic/palliative right L4 TFESI x1 (01/25/2018) (100/100/50/50)  Diagnostic/palliative right L4-5 LESI x1 (01/25/2018) (100/100/50/50)  Palliative left lumbar facet RFA x1 (06/27/2017) (0/0/100/>50)  Palliative right lumbar facet RFA x1 (10/03/2017) (100/100/75)    Therapeutic  Palliative (PRN) options:   Evaluation needed before each determination.   Completed by other providers:      Pharmacotherapy  Nonopioids transferred 08/03/2020: Gabapentin and baclofen Note: Patient taken off of Xarelto after cardioversion.       Recent Visits Date Type Provider Dept  06/22/23 Office Visit Delano Metz, MD [facility]  06/06/23 Procedure visit Delano Metz, MD [facility]  05/23/23 Office Visit Delano Metz, MD [facility]  05/09/23 Procedure visit Delano Metz, MD [facility]  05/01/23 Office Visit Delano Metz, MD [facility]  Showing recent visits within past 90 days and meeting all other requirements Future Appointments Date Type Provider Dept  07/26/23 Appointment Delano Metz, MD [facility]  Showing future appointments within next 90 days and meeting all other requirements  I discussed the assessment and treatment plan with the patient. The patient was provided an opportunity to ask questions and all were answered. The patient agreed with the plan and demonstrated an understanding of the instructions.  Patient advised to call back or seek an in-person evaluation if the symptoms or condition worsens.  Duration of encounter: *** minutes.  Total time on encounter, as per AMA guidelines included both the face-to-face and non-face-to-face time personally spent by the physician and/or other qualified health care professional(s) on the day of the encounter (includes time in activities that require the physician or other qualified health care professional and does not include time in activities normally performed by clinical staff). Physician's time may include the following activities when performed: Preparing to see the patient (e.g., pre-charting review of records, searching for previously ordered imaging, lab work, and nerve conduction tests) Review of prior analgesic pharmacotherapies. Reviewing PMP Interpreting ordered tests (e.g., lab work, imaging, nerve conduction tests) Performing post-procedure evaluations, including interpretation of diagnostic procedures Obtaining and/or reviewing separately  obtained history Performing a medically appropriate examination and/or  evaluation Counseling and educating the patient/family/caregiver Ordering medications, tests, or procedures Referring and communicating with other health care professionals (when not separately reported) Documenting clinical information in the electronic or other health record Independently interpreting results (not separately reported) and communicating results to the patient/ family/caregiver Care coordination (not separately reported)  Note by: Oswaldo Done, MD Date: 07/26/2023; Time: 1:10 PM

## 2023-07-24 NOTE — Telephone Encounter (Signed)
Requested medication (s) are due for refill today - yes  Requested medication (s) are on the active medication list -yes  Future visit scheduled -no  Last refill: 03/28/23  Notes to clinic: Patient has another provider listed as PCP- sent for review of request- may not be patient ay [facility]  Requested Prescriptions  Pending Prescriptions Disp Refills   sertraline (ZOLOFT) 100 MG tablet [Pharmacy Med Name: SERTRALINE HCL 100 MG TAB] 60 tablet 0    Sig: TAKE 2 TABLETS BY MOUTH ONCE EVERY MORNING *NEED APPOINTMENT FOR FURTHER FILLS     Psychiatry:  Antidepressants - SSRI - sertraline Failed - 07/21/2023  4:50 PM      Failed - Valid encounter within last 6 months    Recent Outpatient Visits           10 months ago Chronic bacterial conjunctivitis of both eyes   [place] [facility] Jacky Kindle, FNP   1 year ago Hospital discharge follow-up   [facility] Merita Norton T, FNP   1 year ago COPD exacerbation [facility])   [place] [facility] [place], [place], PA-C   1 year ago Need for immunization against influenza   [facility] Malva Limes, MD   1 year ago COPD exacerbation [facility])   [place] [facility] Malva Limes, MD              Passed - AST in normal range and within 360 days    AST  Date Value Ref Range Status  11/05/2022 23 15 - 41 U/L Final   SGOT(AST)  Date Value Ref Range Status  06/12/2014 14 (L) 15 - 37 Unit/L Final         Passed - ALT in normal range and within 360 days    ALT  Date Value Ref Range Status  11/05/2022 18 0 - 44 U/L Final   SGPT (ALT)  Date Value Ref Range Status  06/12/2014 35 U/L Final    Comment:    14-63 NOTE: New Reference Range 05/06/14          Passed - Completed PHQ-2 or PHQ-9 in the last 360 days         Requested Prescriptions  Pending Prescriptions Disp Refills   sertraline (ZOLOFT) 100 MG tablet [Pharmacy Med  Name: SERTRALINE HCL 100 MG TAB] 60 tablet 0    Sig: TAKE 2 TABLETS BY MOUTH ONCE EVERY MORNING *NEED APPOINTMENT FOR FURTHER FILLS     Psychiatry:  Antidepressants - SSRI - sertraline Failed - 07/21/2023  4:50 PM      Failed - Valid encounter within last 6 months    Recent Outpatient Visits           10 months ago Chronic bacterial conjunctivitis of both eyes   [facility] Jacky Kindle, FNP   1 year ago Hospital discharge follow-up   [facility] Merita Norton T, FNP   1 year ago COPD exacerbation [facility])   [place] [facility] [place], [place], PA-C   1 year ago Need for immunization against influenza   [facility] Malva Limes, MD   1 year ago COPD exacerbation [facility])   [place] [facility] Malva Limes, MD              Passed - AST in normal range and within 360 days  AST  Date Value Ref Range Status  11/05/2022 23 15 - 41 U/L Final   SGOT(AST)  Date Value Ref Range Status  06/12/2014 14 (L) 15 - 37 Unit/L Final         Passed - ALT in normal range and within 360 days    ALT  Date Value Ref Range Status  11/05/2022 18 0 - 44 U/L Final   SGPT (ALT)  Date Value Ref Range Status  06/12/2014 35 U/L Final    Comment:    14-63 NOTE: New Reference Range 05/06/14          Passed - Completed PHQ-2 or PHQ-9 in the last 360 days

## 2023-07-25 NOTE — Patient Instructions (Incomplete)

## 2023-07-26 ENCOUNTER — Encounter: Payer: Self-pay | Admitting: Pain Medicine

## 2023-07-26 ENCOUNTER — Ambulatory Visit: Payer: Medicare HMO | Attending: Pain Medicine | Admitting: Pain Medicine

## 2023-07-26 VITALS — BP 122/75 | HR 80 | Temp 97.2°F | Resp 17 | Ht 62.0 in | Wt 205.0 lb

## 2023-07-26 DIAGNOSIS — M25562 Pain in left knee: Secondary | ICD-10-CM

## 2023-07-26 DIAGNOSIS — Z79899 Other long term (current) drug therapy: Secondary | ICD-10-CM | POA: Diagnosis not present

## 2023-07-26 DIAGNOSIS — M79604 Pain in right leg: Secondary | ICD-10-CM

## 2023-07-26 DIAGNOSIS — M47816 Spondylosis without myelopathy or radiculopathy, lumbar region: Secondary | ICD-10-CM | POA: Diagnosis not present

## 2023-07-26 DIAGNOSIS — M545 Low back pain, unspecified: Secondary | ICD-10-CM | POA: Diagnosis not present

## 2023-07-26 DIAGNOSIS — Z79891 Long term (current) use of opiate analgesic: Secondary | ICD-10-CM | POA: Diagnosis not present

## 2023-07-26 DIAGNOSIS — M25551 Pain in right hip: Secondary | ICD-10-CM | POA: Diagnosis not present

## 2023-07-26 DIAGNOSIS — G8929 Other chronic pain: Secondary | ICD-10-CM | POA: Insufficient documentation

## 2023-07-26 DIAGNOSIS — M25552 Pain in left hip: Secondary | ICD-10-CM

## 2023-07-26 DIAGNOSIS — G894 Chronic pain syndrome: Secondary | ICD-10-CM | POA: Diagnosis not present

## 2023-07-26 DIAGNOSIS — M79605 Pain in left leg: Secondary | ICD-10-CM | POA: Diagnosis not present

## 2023-07-26 MED ORDER — NALOXONE HCL 4 MG/0.1ML NA LIQD: 1.0000 | NASAL | 0 refills | Status: DC | PRN

## 2023-07-26 MED ORDER — HYDROCODONE-ACETAMINOPHEN 5-325 MG PO TABS: 1.0000 | ORAL_TABLET | Freq: Three times a day (TID) | ORAL | 0 refills | Status: DC | PRN

## 2023-07-26 NOTE — Progress Notes (Signed)
Nursing Pain Medication Assessment:  Safety precautions to be maintained throughout the outpatient stay will include: orient to surroundings, keep bed in low position, maintain call bell within reach at all times, provide assistance with transfer out of bed and ambulation.  Medication Inspection Compliance: Pill count conducted under aseptic conditions, in front of the patient. Neither the pills nor the bottle was removed from the patient's sight at any time. Once count was completed pills were immediately returned to the patient in their original bottle.  Medication: Hydrocodone/APAP Pill/Patch Count:  26 of 90 pills remain Pill/Patch Appearance: Markings consistent with prescribed medication Bottle Appearance: Standard pharmacy container. Clearly labeled. Filled Date: 68 / 13 / 2024 Last Medication intake:  Today

## 2023-07-31 ENCOUNTER — Other Ambulatory Visit: Payer: Self-pay

## 2023-07-31 MED ORDER — DAPAGLIFLOZIN PROPANEDIOL 10 MG PO TABS: 10.0000 mg | ORAL_TABLET | Freq: Every day | ORAL | 3 refills | Status: DC

## 2023-08-05 ENCOUNTER — Other Ambulatory Visit: Payer: Self-pay | Admitting: Family

## 2023-08-07 ENCOUNTER — Other Ambulatory Visit: Payer: Self-pay

## 2023-08-07 NOTE — Progress Notes (Unsigned)
PCP: Dorothey Baseman, MD (last seen 07/24) Primary Cardiologist: Dorothyann Peng, MD (last seen 07/24) HF cardiologist: Arvilla Meres, MD (last seen 12/23)  HPI:   Theresa [name] is a 67 y/o female with a history of chronic low back pain, HTN, atrial flutter, pulmonary HTN, depression, COPD on long-term oxygen, anxiety, carpal tunnel syndrome, long-standing tobacco use and chronic heart failure.  Admitted 08/09/22 due to cough and worsening SOB due to COPD exacerbation due to RSV. Hypotension due to overdiuresis. Blood transfusion given due to hemoglobin <7 due to slow GIB. Initially need increased oxygen at 8L but able to be weaned down to baseline 4L. [place] negative for DVT. Discharged after 17 days. Admitted 11/05/22 due to epistaxis. Admitted 11/07/22 due to epistaxis and COPD exacerbation.  Echo 02/19/15: EF of 50% without valvular stenosis.  Echo 11/03/15: but unable to access results.  Echo 07/07/2019: EF of 60-65% along with mild TR and moderately elevated PA pressure. Echo 09/03/20:EF of >55% with mild LAE and severe TR.  Echo 08/08/21: EF of 60-65% along with mild LAE. Echo 04/29/22: EF of 60-65% along with mild MR and no thrombus.    Catheterization done 07/11/2019 showed:  Prox RCA to Dist RCA lesion is 50% stenosed. Mid LAD lesion is 50% stenosed. Ost LM to Dist LM lesion is 10% stenosed. Hemodynamic findings consistent with moderate pulmonary hypertension. Normal overall left ventricular function of at least 60% Moderate coronary disease no significant obstructive disease Moderate pulmonary hypertension  She presents today for a HF follow-up visit with a chief complaint of minimal shortness of breath with moderate exertion. Has fatigue, chronic LBP, chronic difficulty sleeping.    Furoscix a couple of weeks. Eating mexican  O2 on 2L at rest, 6L exertion/ bedtime.   2 weeks off ozempic  No smoking x10 weeks    ROS: All systems negative except as listed in HPI, PMH and  Problem List.  SH:  Social History   Socioeconomic History   Marital status: Married    Spouse name: Harrold Donath   Number of children: 2   Years of education: Not on file   Highest education level: 12th grade  Occupational History   Occupation: disability  Tobacco Use   Smoking status: Former    Current packs/day: 1.50    Average packs/day: 1.5 packs/day for 44.0 years (66.0 ttl pk-yrs)    Types: Cigarettes   Smokeless tobacco: Never   Tobacco comments:    Stopped 4 weeks ago May 20, 2023  Vaping Use   Vaping status: Never Used  Substance and Sexual Activity   Alcohol use: No   Drug use: Yes    Comment: prescribed hydrocodone   Sexual activity: Not Currently    Birth control/protection: Surgical  Other Topics Concern   Not on file  Social History Narrative   ** Merged History Encounter **       Lives with spouse    Social Determinants of Health   Financial Resource Strain: Low Risk  (07/04/2023)   Received [facility]   Overall Financial Resource Strain (CARDIA)    Difficulty of Paying Living Expenses: Not hard at all  Food Insecurity: No Food Insecurity (07/04/2023)   Received from [facility]   Hunger Vital Sign    Worried About Running Out of Food in the Last Year: Never true    Ran Out of Food in the Last Year: Never true  Transportation Needs: No Transportation Needs (07/04/2023)   [facility]   PRAPARE - Transportation    In the past 12 months, has lack of transportation kept you from medical appointments or from getting medications?: No    Lack of Transportation (Non-Medical): No  Physical Activity: Inactive (08/24/2022)   Received from [facility], [facility]   Exercise Vital Sign    Days of Exercise per Week: 0 days    Minutes of Exercise per Session: 0 min  Stress: Stress Concern Present (08/24/2022)   Received from [facility],  [facility]   [employer] of Occupational Health - Occupational Stress Questionnaire    Feeling of Stress : Very much  Social Connections: Socially Integrated (08/24/2022)   Received from [facility], [facility]   Social Connection and Isolation Panel [NHANES]    Frequency of Communication with Friends and Family: More than three times a week    Frequency of Social Gatherings with Friends and Family: Never    Attends Religious Services: More than 4 times per year    Active Member of Clubs or Organizations: Yes    Attends Engineer, structural: More than 4 times per year    Marital Status: Married  Catering manager Violence: Not At Risk (11/08/2022)   Humiliation, Afraid, Rape, and Kick questionnaire    Fear of Current or Ex-Partner: No    Emotionally Abused: No    Physically Abused: No    Sexually Abused: No    FH:  Family History  Problem Relation Age of Onset   Heart disease Mother    Stroke Mother    Coronary artery disease Mother    Alcohol abuse Father    Heart disease Father    Lung cancer Sister    Cancer Sister    Breast cancer Sister    Cancer Brother    Cancer Brother    Kidney cancer Brother    Pneumonia Brother    Prostate cancer Neg Hx    Bladder Cancer Neg Hx     Past Medical History:  Diagnosis Date   Acute postoperative pain 10/03/2017   Anxiety    BiPAP (biphasic positive airway pressure) dependence    at hs   Carpal tunnel syndrome    CHF (congestive heart failure) (HCC)    1/18   CHF (congestive heart failure) (HCC)    Chronic generalized abdominal pain    Chronic rhinitis    COPD (chronic obstructive pulmonary disease) (HCC)    DDD (degenerative disc disease), cervical    DDD (degenerative disc disease), lumbosacral    Depression    Dyspnea    Edema    Flu    1/18   Gastritis    GERD (gastroesophageal reflux disease)    Hematuria    Hernia of abdominal wall 07/17/2018    Hernia, abdominal    Hypertension    Kidney stones    Low back pain    Lumbar radiculitis    Malodorous urine    Muscle weakness    Obesity    Oxygen dependent    Pneumonia due to COVID-19 virus 02/03/2020   Renal cyst    Sensory urge incontinence    Thyroid activity decreased    9/19   Tobacco abuse    Wheezing     Current Outpatient Medications  Medication Sig Dispense Refill   acetaminophen (TYLENOL) 500 MG tablet Take 1,000 mg by mouth every 8 (eight) hours as needed for  mild pain or headache.     albuterol (VENTOLIN HFA) 108 (90 Base) MCG/ACT inhaler Inhale 2 puffs into the lungs every 6 (six) hours as needed for wheezing or shortness of breath. 8 g 1   amiodarone (PACERONE) 200 MG tablet Take 1 tablet (200 mg total) by mouth daily. 30 tablet 0   busPIRone (BUSPAR) 5 MG tablet TAKE 1 TABLET BY MOUTH TWICE DAILY 180 tablet 1   Cholecalciferol 50 MCG (2000 UT) CAPS Take 2,000 Units by mouth daily.      dapagliflozin propanediol (FARXIGA) 10 MG TABS tablet Take 1 tablet (10 mg total) by mouth daily. 90 tablet 3   diclofenac Sodium (VOLTAREN) 1 % GEL Apply 2 g topically 4 (four) times daily as needed (lower back and/or shoulder pain).     diltiazem (CARDIZEM CD) 180 MG 24 hr capsule Take 180 mg by mouth daily.     Fluticasone-Umeclidin-Vilant 100-62.5-25 MCG/INH AEPB Inhale 1 puff into the lungs daily.     FUROSCIX 80 MG/10ML CTKT Inject into the skin.     HYDROcodone-acetaminophen (NORCO/VICODIN) 5-325 MG tablet Take 1 tablet by mouth every 8 (eight) hours as needed for severe pain. Must last 30 days 90 tablet 0   [START ON 08/29/2023] HYDROcodone-acetaminophen (NORCO/VICODIN) 5-325 MG tablet Take 1 tablet by mouth every 8 (eight) hours as needed for severe pain. Must last 30 days 90 tablet 0   [START ON 09/28/2023] HYDROcodone-acetaminophen (NORCO/VICODIN) 5-325 MG tablet Take 1 tablet by mouth every 8 (eight) hours as needed for severe pain. Must last 30 days 90 tablet 0    ipratropium-albuterol (DUONEB) 0.5-2.5 (3) MG/3ML SOLN USE 1 VIAL VIA NEBULIZER EVERY 6 HOURS AS NEEDED FOR SHORTNESS OF BREATH OR WHEEZING 1080 mL 0   melatonin 5 MG TABS Take 5 mg by mouth at bedtime.     naloxone (NARCAN) nasal spray 4 mg/0.1 mL Place 1 spray into the nose as needed for up to 365 doses (for opioid-induced respiratory depresssion). In case of emergency (overdose), spray once into each nostril. If no response within 3 minutes, repeat application and call 911. 1 each 0   omeprazole (PRILOSEC) 40 MG capsule Take 40 mg by mouth daily.     polyethylene glycol (MIRALAX / GLYCOLAX) 17 g packet Take 17 g by mouth daily as needed for mild constipation or moderate constipation.     potassium chloride SA (KLOR-CON M) 20 MEQ tablet Take 2 tablets every morning and 1 tablet every evening (Patient taking differently: 40 mEq 2 (two) times daily.) 270 tablet 3   roflumilast (DALIRESP) 500 MCG TABS tablet Take 500 mcg by mouth daily.     Semaglutide-Weight Management (WEGOVY) 0.25 MG/0.5ML SOAJ Inject 0.25 mg into the skin once a week. 2 mL 5   sertraline (ZOLOFT) 100 MG tablet Take 2 tablets (200 mg total) by mouth daily. Please schedule office visit before any future refill. 60 tablet 0   spironolactone (ALDACTONE) 25 MG tablet Take 0.5 tablets (12.5 mg total) by mouth daily. 45 tablet 3   torsemide (DEMADEX) 20 MG tablet Take 40-80 mg by mouth See admin instructions. Take 4 tablets (80mg ) by mouth every morning and take 2 tablets (40mg ) by mouth every afternoon     No current facility-administered medications for this visit.   Vitals:   08/08/23 1458  BP: 129/75  Pulse: 88  SpO2: 92%  Weight: 214 lb (97.1 kg)  Height: 5\' 2"  (1.575 m)   Wt Readings from Last 3 Encounters:  08/08/23  214 lb (97.1 kg)  07/26/23 205 lb (93 kg)  06/22/23 212 lb (96.2 kg)   Lab Results  Component Value Date   CREATININE 0.97 06/05/2023   CREATININE 0.71 11/08/2022   CREATININE 0.84 11/07/2022    PHYSICAL EXAM:  General:  Well appearing. No resp difficulty HEENT: normal Neck: supple. JVP flat. No lymphadenopathy or thryomegaly appreciated. Cor: PMI normal. Regular rate & rhythm. No rubs, gallops or murmurs. Lungs: clear Abdomen: soft, nontender, nondistended. No hepatosplenomegaly. No bruits or masses.  Extremities: no cyanosis, clubbing, rash, edema Neuro: alert & oriented x3, cranial nerves grossly intact. Moves all 4 extremities w/o difficulty. Affect pleasant.   ECG: not done   ASSESSMENT & PLAN:  1: NICM with preserved ejection fraction- - likely due to pulmonary HTN/ OSA - NYHA class III - euvolemic today - weighing daily; reminded to call for an overnight weight gain of >2 pounds or a weekly weight gain of >5 pounds - weight stable from last visit here 2 months ago - continue farxiga 10mg  daily - continue torsemide 80mg  AM/ 40mg  PM - continue potassium BID  - continue spironolactone 12.5mg  daily  - furoscix PRN; she says that it's been "awhile" since she last used this  - wearing compression socks daily although doesn't have them on today - BNP 08/31/22 was 36.4  2: HTN- - BP 129/75 - saw PCP (Bronstein) 07/24 - BMP 06/05/23 reviewed and showed sodium 140, potassium 3.8, creatinine 0.97 and GFR 64    3: COPD- - wearing bipap nightly - oxygen at 6L around the clock on continuous system - saw pulmonology Meredeth Ide) 09/24  4: Tobacco use- - smoking 1 ppd - removes herself from the oxygen when smoking - complete cessation discussed  5: Atrial flutter- - previous cardioversion done - saw cardiologist Juliann Pares) 07/24 - continue diltazem CD 180mg  daily - continue amiodarone 200mg  daily  6: Pulmonary HTN- - mild to moderate on cath in 2020 - cath with high output physiology. No shunts on TEE. No evidence of anomalous PV drainage or other shunts on CT - CT chest 2023 no PE. 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  - no role for selective vasodilators  - continue O2 and Bipap support

## 2023-08-08 ENCOUNTER — Encounter: Payer: Medicare HMO | Admitting: Family

## 2023-08-08 ENCOUNTER — Ambulatory Visit: Payer: Medicare HMO | Attending: Family | Admitting: Family

## 2023-08-08 VITALS — BP 129/75 | HR 88 | Ht 62.0 in | Wt 214.0 lb

## 2023-08-08 DIAGNOSIS — F32A Depression, unspecified: Secondary | ICD-10-CM | POA: Insufficient documentation

## 2023-08-08 DIAGNOSIS — I10 Essential (primary) hypertension: Secondary | ICD-10-CM

## 2023-08-08 DIAGNOSIS — Z87891 Personal history of nicotine dependence: Secondary | ICD-10-CM | POA: Insufficient documentation

## 2023-08-08 DIAGNOSIS — Z79899 Other long term (current) drug therapy: Secondary | ICD-10-CM | POA: Insufficient documentation

## 2023-08-08 DIAGNOSIS — Z720 Tobacco use: Secondary | ICD-10-CM

## 2023-08-08 DIAGNOSIS — M545 Low back pain, unspecified: Secondary | ICD-10-CM | POA: Insufficient documentation

## 2023-08-08 DIAGNOSIS — G8929 Other chronic pain: Secondary | ICD-10-CM | POA: Diagnosis not present

## 2023-08-08 DIAGNOSIS — Z7984 Long term (current) use of oral hypoglycemic drugs: Secondary | ICD-10-CM | POA: Insufficient documentation

## 2023-08-08 DIAGNOSIS — I110 Hypertensive heart disease with heart failure: Secondary | ICD-10-CM | POA: Diagnosis not present

## 2023-08-08 DIAGNOSIS — I5032 Chronic diastolic (congestive) heart failure: Secondary | ICD-10-CM | POA: Diagnosis not present

## 2023-08-08 DIAGNOSIS — I272 Pulmonary hypertension, unspecified: Secondary | ICD-10-CM | POA: Insufficient documentation

## 2023-08-08 DIAGNOSIS — Z716 Tobacco abuse counseling: Secondary | ICD-10-CM | POA: Diagnosis not present

## 2023-08-08 DIAGNOSIS — Z9981 Dependence on supplemental oxygen: Secondary | ICD-10-CM | POA: Insufficient documentation

## 2023-08-08 DIAGNOSIS — I428 Other cardiomyopathies: Secondary | ICD-10-CM | POA: Insufficient documentation

## 2023-08-08 DIAGNOSIS — J449 Chronic obstructive pulmonary disease, unspecified: Secondary | ICD-10-CM | POA: Insufficient documentation

## 2023-08-08 DIAGNOSIS — R0602 Shortness of breath: Secondary | ICD-10-CM | POA: Insufficient documentation

## 2023-08-08 DIAGNOSIS — F419 Anxiety disorder, unspecified: Secondary | ICD-10-CM | POA: Diagnosis not present

## 2023-08-08 DIAGNOSIS — I4892 Unspecified atrial flutter: Secondary | ICD-10-CM | POA: Diagnosis not present

## 2023-08-08 DIAGNOSIS — I509 Heart failure, unspecified: Secondary | ICD-10-CM | POA: Insufficient documentation

## 2023-08-08 NOTE — Patient Instructions (Signed)
Congratulations on stopping smoking! Keep up the great work!

## 2023-08-09 ENCOUNTER — Other Ambulatory Visit: Payer: Self-pay | Admitting: Family Medicine

## 2023-08-09 ENCOUNTER — Encounter: Payer: Self-pay | Admitting: Family Medicine

## 2023-08-09 ENCOUNTER — Encounter: Payer: Self-pay | Admitting: Family

## 2023-08-09 DIAGNOSIS — Z1231 Encounter for screening mammogram for malignant neoplasm of breast: Secondary | ICD-10-CM

## 2023-08-10 ENCOUNTER — Encounter: Payer: Self-pay | Admitting: Family

## 2023-08-11 ENCOUNTER — Other Ambulatory Visit: Payer: Self-pay

## 2023-08-11 ENCOUNTER — Other Ambulatory Visit: Payer: Self-pay | Admitting: Family

## 2023-08-11 MED ORDER — WEGOVY 0.5 MG/0.5ML ~~LOC~~ SOAJ: 0.5000 mg | SUBCUTANEOUS | 3 refills | Status: DC

## 2023-08-23 DIAGNOSIS — J9610 Chronic respiratory failure, unspecified whether with hypoxia or hypercapnia: Secondary | ICD-10-CM | POA: Diagnosis not present

## 2023-08-29 ENCOUNTER — Other Ambulatory Visit: Payer: Self-pay

## 2023-08-29 ENCOUNTER — Emergency Department: Payer: Medicare HMO

## 2023-08-29 ENCOUNTER — Emergency Department
Admission: EM | Admit: 2023-08-29 | Discharge: 2023-08-29 | Disposition: A | Discharge status: Home / Self Care | Payer: Medicare HMO | Attending: Emergency Medicine | Admitting: Emergency Medicine

## 2023-08-29 DIAGNOSIS — I110 Hypertensive heart disease with heart failure: Secondary | ICD-10-CM | POA: Insufficient documentation

## 2023-08-29 DIAGNOSIS — U071 COVID-19: Secondary | ICD-10-CM | POA: Insufficient documentation

## 2023-08-29 DIAGNOSIS — J449 Chronic obstructive pulmonary disease, unspecified: Secondary | ICD-10-CM | POA: Diagnosis not present

## 2023-08-29 DIAGNOSIS — I509 Heart failure, unspecified: Secondary | ICD-10-CM | POA: Diagnosis not present

## 2023-08-29 DIAGNOSIS — R0602 Shortness of breath: Secondary | ICD-10-CM | POA: Diagnosis not present

## 2023-08-29 DIAGNOSIS — R918 Other nonspecific abnormal finding of lung field: Secondary | ICD-10-CM | POA: Diagnosis not present

## 2023-08-29 DIAGNOSIS — R0609 Other forms of dyspnea: Secondary | ICD-10-CM

## 2023-08-29 LAB — CBC
HCT: 39.2 % (ref 36.0–46.0)
Hemoglobin: 11.9 g/dL — ABNORMAL LOW (ref 12.0–15.0)
MCH: 29.2 pg (ref 26.0–34.0)
MCHC: 30.4 g/dL (ref 30.0–36.0)
MCV: 96.3 fL (ref 80.0–100.0)
Platelets: 167 10*3/uL (ref 150–400)
RBC: 4.07 MIL/uL (ref 3.87–5.11)
RDW: 15.7 % — ABNORMAL HIGH (ref 11.5–15.5)
WBC: 8.6 10*3/uL (ref 4.0–10.5)
nRBC: 0 % (ref 0.0–0.2)

## 2023-08-29 LAB — RESP PANEL BY RT-PCR (RSV, FLU A&B, COVID)  RVPGX2
Influenza A by PCR: NEGATIVE
Influenza B by PCR: NEGATIVE
Resp Syncytial Virus by PCR: NEGATIVE
SARS Coronavirus 2 by RT PCR: POSITIVE — AB

## 2023-08-29 LAB — BASIC METABOLIC PANEL
Anion gap: 7 (ref 5–15)
BUN: 14 mg/dL (ref 8–23)
CO2: 25 mmol/L (ref 22–32)
Calcium: 8.7 mg/dL — ABNORMAL LOW (ref 8.9–10.3)
Chloride: 107 mmol/L (ref 98–111)
Creatinine, Ser: 0.87 mg/dL (ref 0.44–1.00)
GFR, Estimated: >60 mL/min (ref >60)
Glucose, Bld: 92 mg/dL (ref 70–99)
Potassium: 3.5 mmol/L (ref 3.5–5.1)
Sodium: 139 mmol/L (ref 135–145)

## 2023-08-29 LAB — TROPONIN I (HIGH SENSITIVITY)
Troponin I (High Sensitivity): 6 ng/L (ref <18)
Troponin I (High Sensitivity): 6 ng/L (ref <18)

## 2023-08-29 MED ORDER — PREDNISONE 10 MG PO TABS: 40.0000 mg | ORAL_TABLET | Freq: Every day | ORAL | 0 refills | Status: AC

## 2023-08-29 MED ORDER — DEXAMETHASONE SODIUM PHOSPHATE 10 MG/ML IJ SOLN
10.0000 mg | Freq: Once | INTRAMUSCULAR | Status: AC
  Administered 2023-08-29: 10 mg via INTRAVENOUS
  Filled 2023-08-29: qty 1

## 2023-08-29 MED ORDER — IPRATROPIUM-ALBUTEROL 0.5-2.5 (3) MG/3ML IN SOLN
3.0000 mL | Freq: Once | RESPIRATORY_TRACT | Status: AC
  Administered 2023-08-29: 3 mL via RESPIRATORY_TRACT
  Filled 2023-08-29: qty 3

## 2023-08-29 NOTE — Discharge Instructions (Signed)
Please pick up your medication from the pharmacy and take as prescribed.  Please continue taking your breathing treatments at home.  Please return emergently to the emergency department if you notice any severe worsening shortness of breath.

## 2023-08-29 NOTE — ED Notes (Signed)
Pt ambulated in room to RR and oxygen was between 80-82% when pt came back to bed. Pt oxygen between 90-88% during ambulation with HR 85-90s. Pt reports typically at baseline her oxygen will drop to 80% on her baseline 6L with ambulation to her kitchen.

## 2023-08-29 NOTE — ED Triage Notes (Signed)
Pt to ED via P[facility] from home. Pt reports feeling increased SOB and fatigue and took at home COVID test Monday that was positive. Pt reports symptoms have worsened with chest tightness. Pt with hx of COPD and on 6L [place]. Pt denies N/V/D or HA.

## 2023-08-29 NOTE — ED Provider Notes (Signed)
[facility] Provider Note    Event Date/Time   First MD Initiated Contact with Patient 08/29/23 1926     (approximate)   History   Shortness of Breath (+ at home COVID test)   HPI Theresa [name] is a 67 y.o. female with history of CHF, COPD, chronic hypoxic respiratory failure on 6 L [place], HTN presenting today for shortness of breath.  Patient reportedly had a positive home COVID test yesterday.  Has had some worsening shortness of breath and fatigue as well.  No change in her oxygen level.  Notes some chest tightness that does improve with her breathing treatments at home.  Otherwise no other chest pain, nausea, vomiting, abdominal pain, fever, diarrhea, leg pain, leg swelling.  Reviewed recent chart notes.     Physical Exam   Triage Vital Signs: ED Triage Vitals [08/29/23 1829]  Encounter Vitals Group     BP (!) 144/77     Systolic BP Percentile      Diastolic BP Percentile      Pulse Rate 70     Resp 20     Temp 99 F (37.2 C)     Temp Source Oral     SpO2 96 %     Weight      Height      Head Circumference      Peak Flow      Pain Score 6     Pain Loc      Pain Education      Exclude from Growth Chart     Most recent vital signs: Vitals:   08/29/23 1935 08/29/23 2030  BP: 122/71 123/60  Pulse: 78 66  Resp: 19 19  Temp:    SpO2: 100% 94%   Physical Exam: I have reviewed the vital signs and nursing notes. General: Awake, alert, no acute distress.  Nontoxic appearing. Head:  Atraumatic, normocephalic.   ENT:  EOM intact, PERRL. Oral mucosa is pink and moist with no lesions. Neck: Neck is supple with full range of motion, No meningeal signs. Cardiovascular:  RRR, No murmurs. Peripheral pulses palpable and equal bilaterally. Respiratory:  Symmetrical chest wall expansion.  End expiratory wheezing noted throughout all lung fields.  No tachypnea.  On baseline oxygen level.  Good air movement throughout.  No use of accessory muscles.    Musculoskeletal:  No cyanosis or edema. Moving extremities with full ROM Abdomen:  Soft, nontender, nondistended. Neuro:  GCS 15, moving all four extremities, interacting appropriately. Speech clear. Psych:  Calm, appropriate.   Skin:  Warm, dry, no rash.    ED Results / Procedures / Treatments   Labs (all labs ordered are listed, but only abnormal results are displayed) Labs Reviewed  BASIC METABOLIC PANEL - Abnormal; Notable for the following components:      Result Value   Calcium 8.7 (*)    All other components within normal limits  CBC - Abnormal; Notable for the following components:   Hemoglobin 11.9 (*)    RDW 15.7 (*)    All other components within normal limits  RESP PANEL BY RT-PCR (RSV, FLU A&B, COVID)  [id number]  TROPONIN I (HIGH SENSITIVITY)  TROPONIN I (HIGH SENSITIVITY)     EKG My EKG interpretation: Rate of 73, normal sinus rhythm.  Left axis deviation.  No acute ST elevations or depressions   RADIOLOGY Independently interpreted chest x-ray with no obvious consolidations and likely more consistent with known COVID   PROCEDURES:  Critical Care performed: No  Procedures   MEDICATIONS ORDERED IN ED: Medications  dexamethasone (DECADRON) injection 10 mg (has no administration in time range)  ipratropium-albuterol (DUONEB) 0.5-2.5 (3) MG/3ML nebulizer solution 3 mL (3 mLs Nebulization Given 08/29/23 1949)     IMPRESSION / MDM / ASSESSMENT AND PLAN / ED COURSE  I reviewed the triage vital signs and the nursing notes.                              Differential diagnosis includes, but is not limited to, COVID-19, superimposed pneumonia  Patient's presentation is most consistent with acute complicated illness / injury requiring diagnostic workup.  Patient is a 67 year old female presenting today for shortness of breath with positive home COVID test.  Has felt this way for several days with some mild fatigue.  Vital signs are stable on arrival and she has  no tachypnea.  Lung sounds largely reassuring with only some mild expiratory wheezing which she was given a DuoNeb with significant symptom improvement.  Patient normally on 6 L and is currently on that right now satting in the upper 90s.  EKG unremarkable and troponins negative x 2.  CBC and BMP otherwise reassuring.  Chest x-ray largely unremarkable with no focal consolidations more indicative of her known COVID diagnosis.  I had the nurse perform an oxygen ambulation trial.  During this, patient had 1 episode where she dipped in the 80s on her oxygen.  She reports this normally happens at home even when she is not sick and she intermittently has to increase to 8 to 10 L with ambulation and then at rest is on 6 L.  Patient appears to be exactly on her normal home regimen right now in terms of oxygen supply.  She otherwise feels exactly at her baseline now after getting the breathing treatment.  I offered admission for ongoing steroid treatment and breathing treatments.  Patient stated she preferred to go home since she felt well at this time.  Will give her a dose of Decadron and send her out on prednisone.  Patient was given strict return precautions and told to follow-up with her PCP.  The patient is on the cardiac monitor to evaluate for evidence of arrhythmia and/or significant heart rate changes. Clinical Course as of 08/29/23 2228  Tue Aug 29, 2023  2047 Troponin I (High Sensitivity): 6 Negative x 2 [DW]  2225 Resp panel by RT-PCR (RSV, Flu A&B, Covid) Anterior Nasal Swab Delay in lab with running the respiratory panel.  Patient already had home positive COVID test.  Do not think she needs to wait for this for full evaluation.  Patient was ambulated with no significant change in tachypnea, tachycardia.  Reported that she felt mostly similar to her baseline at this time.  Offered admission but patient stated she preferred to go home since she otherwise felt well with reassuring labs.  Will give dose  of Decadron and send her out on prednisone for treatment.  Given strict return precautions. [DW]    Clinical Course User Index [DW] Janith Lima, MD     FINAL CLINICAL IMPRESSION(S) / ED DIAGNOSES   Final diagnoses:  COVID-19  Dyspnea on exertion     Rx / DC Orders   ED Discharge Orders          Ordered    predniSONE (DELTASONE) 10 MG tablet  Daily        08/29/23 2227  Note:  This document was prepared using Dragon voice recognition software and may include unintentional dictation errors.   Janith Lima, MD 08/29/23 2233

## 2023-09-08 DIAGNOSIS — J449 Chronic obstructive pulmonary disease, unspecified: Secondary | ICD-10-CM | POA: Diagnosis not present

## 2023-09-22 DIAGNOSIS — J9610 Chronic respiratory failure, unspecified whether with hypoxia or hypercapnia: Secondary | ICD-10-CM | POA: Diagnosis not present

## 2023-09-28 ENCOUNTER — Other Ambulatory Visit: Payer: Self-pay | Admitting: Pain Medicine

## 2023-09-28 DIAGNOSIS — G894 Chronic pain syndrome: Secondary | ICD-10-CM

## 2023-09-28 DIAGNOSIS — Z79891 Long term (current) use of opiate analgesic: Secondary | ICD-10-CM

## 2023-09-28 DIAGNOSIS — G8929 Other chronic pain: Secondary | ICD-10-CM

## 2023-09-28 DIAGNOSIS — Z79899 Other long term (current) drug therapy: Secondary | ICD-10-CM

## 2023-09-28 DIAGNOSIS — M545 Low back pain, unspecified: Secondary | ICD-10-CM

## 2023-09-28 DIAGNOSIS — M47816 Spondylosis without myelopathy or radiculopathy, lumbar region: Secondary | ICD-10-CM

## 2023-10-03 DIAGNOSIS — J449 Chronic obstructive pulmonary disease, unspecified: Secondary | ICD-10-CM | POA: Diagnosis not present

## 2023-10-03 DIAGNOSIS — Z6838 Body mass index (BMI) 38.0-38.9, adult: Secondary | ICD-10-CM | POA: Diagnosis not present

## 2023-10-03 DIAGNOSIS — R0602 Shortness of breath: Secondary | ICD-10-CM | POA: Diagnosis not present

## 2023-10-03 DIAGNOSIS — F17210 Nicotine dependence, cigarettes, uncomplicated: Secondary | ICD-10-CM | POA: Diagnosis not present

## 2023-10-03 DIAGNOSIS — I272 Pulmonary hypertension, unspecified: Secondary | ICD-10-CM | POA: Diagnosis not present

## 2023-10-05 ENCOUNTER — Other Ambulatory Visit: Payer: Self-pay | Admitting: Specialist

## 2023-10-05 DIAGNOSIS — F17210 Nicotine dependence, cigarettes, uncomplicated: Secondary | ICD-10-CM

## 2023-10-05 DIAGNOSIS — R0602 Shortness of breath: Secondary | ICD-10-CM

## 2023-10-08 DIAGNOSIS — J449 Chronic obstructive pulmonary disease, unspecified: Secondary | ICD-10-CM | POA: Diagnosis not present

## 2023-10-17 ENCOUNTER — Ambulatory Visit
Admission: RE | Admit: 2023-10-17 | Discharge: 2023-10-17 | Disposition: A | Discharge status: Home / Self Care | Payer: Medicare HMO | Source: Ambulatory Visit | Attending: Specialist | Admitting: Specialist

## 2023-10-17 DIAGNOSIS — R0602 Shortness of breath: Secondary | ICD-10-CM | POA: Diagnosis not present

## 2023-10-17 DIAGNOSIS — F17210 Nicotine dependence, cigarettes, uncomplicated: Secondary | ICD-10-CM | POA: Insufficient documentation

## 2023-10-17 DIAGNOSIS — Z87891 Personal history of nicotine dependence: Secondary | ICD-10-CM | POA: Diagnosis not present

## 2023-10-23 DIAGNOSIS — J9610 Chronic respiratory failure, unspecified whether with hypoxia or hypercapnia: Secondary | ICD-10-CM | POA: Diagnosis not present

## 2023-10-24 NOTE — Patient Instructions (Signed)
 ______________________________________________________________________    Opioid Pain Medication Update  To: All patients taking opioid pain medications. (I.e.: hydrocodone, hydromorphone, oxycodone, oxymorphone, morphine, codeine, methadone, tapentadol, tramadol, buprenorphine, fentanyl, etc.)  Re: Updated review of side effects and adverse reactions of opioid analgesics, as well as new information about long term effects of this class of medications.  Direct risks of long-term opioid therapy are not limited to opioid addiction and overdose. Potential medical risks include serious fractures, breathing problems during sleep, hyperalgesia, immunosuppression, chronic constipation, bowel obstruction, myocardial infarction, and tooth decay secondary to xerostomia.  Unpredictable adverse effects that can occur even if you take your medication correctly: Cognitive impairment, respiratory depression, and death. Most people think that if they take their medication "correctly", and "as instructed", that they will be safe. Nothing could be farther from the truth. In reality, a significant amount of recorded deaths associated with the use of opioids has occurred in individuals that had taken the medication for a long time, and were taking their medication correctly. The following are examples of how this can happen: Patient taking his/her medication for a long time, as instructed, without any side effects, is given a certain antibiotic or another unrelated medication, which in turn triggers a "Drug-to-drug interaction" leading to disorientation, cognitive impairment, impaired reflexes, respiratory depression or an untoward event leading to serious bodily harm or injury, including death.  Patient taking his/her medication for a long time, as instructed, without any side effects, develops an acute impairment of liver and/or kidney function. This will lead to a rapid inability of the body to breakdown and eliminate  their pain medication, which will result in effects similar to an "overdose", but with the same medicine and dose that they had always taken. This again may lead to disorientation, cognitive impairment, impaired reflexes, respiratory depression or an untoward event leading to serious bodily harm or injury, including death.  A similar problem will occur with patients as they grow older and their liver and kidney function begins to decrease as part of the aging process.  Background information: Historically, the original case for using long-term opioid therapy to treat chronic noncancer pain was based on safety assumptions that subsequent experience has called into question. In 1996, the American Pain Society and the American Academy of Pain Medicine issued a consensus statement supporting long-term opioid therapy. This statement acknowledged the dangers of opioid prescribing but concluded that the risk for addiction was low; respiratory depression induced by opioids was short-lived, occurred mainly in opioid-naive patients, and was antagonized by pain; tolerance was not a common problem; and efforts to control diversion should not constrain opioid prescribing. This has now proven to be wrong. Experience regarding the risks for opioid addiction, misuse, and overdose in community practice has failed to support these assumptions.  According to the Centers for Disease Control and Prevention, fatal overdoses involving opioid analgesics have increased sharply over the past decade. Currently, more than 96,700 people die from drug overdoses every year. Opioids are a factor in 7 out of every 10 overdose deaths. Deaths from drug overdose have surpassed motor vehicle accidents as the leading cause of death for individuals between the ages of 18 and 39.  Clinical data suggest that neuroendocrine dysfunction may be very common in both men and women, potentially causing hypogonadism, erectile dysfunction, infertility,  decreased libido, osteoporosis, and depression. Recent studies linked higher opioid dose to increased opioid-related mortality. Controlled observational studies reported that long-term opioid therapy may be associated with increased risk for cardiovascular events. Subsequent  meta-analysis concluded that the safety of long-term opioid therapy in elderly patients has not been proven.   Side Effects and adverse reactions: Common side effects: Drowsiness (sedation). Dizziness. Nausea and vomiting. Constipation. Physical dependence -- Dependence often manifests with withdrawal symptoms when opioids are discontinued or decreased. Tolerance -- As you take repeated doses of opioids, you require increased medication to experience the same effect of pain relief. Respiratory depression -- This can occur in healthy people, especially with higher doses. However, people with COPD, asthma or other lung conditions may be even more susceptible to fatal respiratory impairment.  Uncommon side effects: An increased sensitivity to feeling pain and extreme response to pain (hyperalgesia). Chronic use of opioids can lead to this. Delayed gastric emptying (the process by which the contents of your stomach are moved into your small intestine). Muscle rigidity. Immune system and hormonal dysfunction. Quick, involuntary muscle jerks (myoclonus). Arrhythmia. Itchy skin (pruritus). Dry mouth (xerostomia).  Long-term side effects: Chronic constipation. Sleep-disordered breathing (SDB). Increased risk of bone fractures. Hypothalamic-pituitary-adrenal dysregulation. Increased risk of overdose.  RISKS: Respiratory depression and death: Opioids increase the risk of respiratory depression and death.  Drug-to-drug interactions: Opioids are relatively contraindicated in combination with benzodiazepines, sleep inducers, and other central nervous system depressants. Other classes of medications (i.e.: certain antibiotics  and even over-the-counter medications) may also trigger or induce respiratory depression in some patients.  Medical conditions: Patients with pre-existing respiratory problems are at higher risk of respiratory failure and/or depression when in combination with opioid analgesics. Opioids are relatively contraindicated in some medical conditions such as central sleep apnea.   Fractures and Falls:  Opioids increase the risk and incidence of falls. This is of particular importance in elderly patients.  Endocrine System:  Long-term administration is associated with endocrine abnormalities (endocrinopathies). (Also known as Opioid-induced Endocrinopathy) Influences on both the hypothalamic-pituitary-adrenal axis?and the hypothalamic-pituitary-gonadal axis have been demonstrated with consequent hypogonadism and adrenal insufficiency in both sexes. Hypogonadism and decreased levels of dehydroepiandrosterone sulfate have been reported in men and women. Endocrine effects include: Amenorrhoea in women (abnormal absence of menstruation) Reduced libido in both sexes Decreased sexual function Erectile dysfunction in men Hypogonadisms (decreased testicular function with shrinkage of testicles) Infertility Depression and fatigue Loss of muscle mass Anxiety Depression Immune suppression Hyperalgesia Weight gain Anemia Osteoporosis Patients (particularly women of childbearing age) should avoid opioids. There is insufficient evidence to recommend routine monitoring of asymptomatic patients taking opioids in the long-term for hormonal deficiencies.  Immune System: Human studies have demonstrated that opioids have an immunomodulating effect. These effects are mediated via opioid receptors both on immune effector cells and in the central nervous system. Opioids have been demonstrated to have adverse effects on antimicrobial response and anti-tumour surveillance. Buprenorphine has been demonstrated to have  no impact on immune function.  Opioid Induced Hyperalgesia: Human studies have demonstrated that prolonged use of opioids can lead to a state of abnormal pain sensitivity, sometimes called opioid induced hyperalgesia (OIH). Opioid induced hyperalgesia is not usually seen in the absence of tolerance to opioid analgesia. Clinically, hyperalgesia may be diagnosed if the patient on long-term opioid therapy presents with increased pain. This might be qualitatively and anatomically distinct from pain related to disease progression or to breakthrough pain resulting from development of opioid tolerance. Pain associated with hyperalgesia tends to be more diffuse than the pre-existing pain and less defined in quality. Management of opioid induced hyperalgesia requires opioid dose reduction.  Cancer: Chronic opioid therapy has been associated with an increased risk  of cancer among noncancer patients with chronic pain. This association was more evident in chronic strong opioid users. Chronic opioid consumption causes significant pathological changes in the small intestine and colon. Epidemiological studies have found that there is a link between opium dependence and initiation of gastrointestinal cancers. Cancer is the second leading cause of death after cardiovascular disease. Chronic use of opioids can cause multiple conditions such as GERD, immunosuppression and renal damage as well as carcinogenic effects, which are associated with the incidence of cancers.   Mortality: Long-term opioid use has been associated with increased mortality among patients with chronic non-cancer pain (CNCP).  Prescription of long-acting opioids for chronic noncancer pain was associated with a significantly increased risk of all-cause mortality, including deaths from causes other than overdose.  Reference: Von Korff M, Kolodny A, Deyo RA, Chou R. Long-term opioid therapy reconsidered. Ann Intern Med. 2011 Sep 6;155(5):325-8. doi:  10.7326/[id number]. PMID: 16109604; PMCID: [id number]. Randon Goldsmith, Hayward RA, Dunn KM, [place] KP. Risk of adverse events in patients prescribed long-term opioids: A cohort study in the [place] Clinical Practice Research Datalink. Eur J Pain. 2019 May;23(5):908-922. doi: 10.1002/ejp.1357. Epub 2019 Jan 31. PMID: 78295621. Colameco S, Coren JS, Ciervo CA. Continuous opioid treatment for chronic noncancer pain: a time for moderation in prescribing. Postgrad Med. 2009 Jul;121(4):61-6. doi: 10.3810/pgm.2009.07.2032. PMID: 30865784. William Hamburger RN, [place] SD, Blazina I, Cristopher Peru, Bougatsos C, Deyo RA. The effectiveness and risks of long-term opioid therapy for chronic pain: a systematic review for a [employer] of [employer] Pathways to [employer]. Ann Intern Med. 2015 Feb 17;162(4):276-86. doi: 10.7326/M14-2559. PMID: 69629528. Caryl Bis [facility], [place] DM. NCHS Data Brief No. 22. [place]: Centers for Disease Control and Prevention; 2009. Sep, Increase in Fatal Poisonings Involving Opioid Analgesics in the [place], 1999-2006. Song IA, Choi HR, Oh TK. Long-term opioid use and mortality in patients with chronic non-cancer pain: Ten-year follow-up study in [place] from 2010 through 2019. EClinicalMedicine. 2022 Jul 18;51:101558. doi: 10.1016/j.eclinm.2022.413244. PMID: 01027253; PMCID: [id number]. Huser, W., Schubert, T., Vogelmann, T. et al. All-cause mortality in patients with long-term opioid therapy compared with non-opioid analgesics for chronic non-cancer pain: a database study. BMC Med 18, 162 (2020). http://lester.info/ Rashidian H, Karie Kirks, Malekzadeh R, Haghdoost AA. An Ecological Study of the Association between Opiate Use and Incidence of Cancers. Addict Health. 2016 Fall;8(4):252-260. PMID: 59563875; PMCID: [id number].  Our Goal: Our goal is to control your pain with means other  than the use of opioid pain medications.  Our Recommendation: Talk to your physician about coming off of these medications. We can assist you with the tapering down and stopping these medicines. Based on the new information, even if you cannot completely stop the medication, a decrease in the dose may be associated with a lesser risk. Ask for other means of controlling the pain. Decrease or eliminate those factors that significantly contribute to your pain such as smoking, obesity, and a diet heavily tilted towards "inflammatory" nutrients.  Last Updated: 04/24/2023   ______________________________________________________________________       ______________________________________________________________________    National Pain Medication Shortage  The [place] is experiencing worsening drug shortages. These have had a negative widespread effect on patient care and treatment. Not expected to improve any time soon. Predicted to last past 2029.   Drug shortage list (generic names) Oxycodone IR Oxycodone/APAP Oxymorphone IR Hydromorphone Hydrocodone/APAP Morphine  Where is the problem?  Manufacturing and supply level.  Will this shortage  affect you?  Only if you take any of the above pain medications.  How? You may be unable to fill your prescription.  Your pharmacist may offer a "partial fill" of your prescription. (Warning: Do not accept partial fills.) Prescriptions partially filled cannot be transferred to another pharmacy. Read our Medication Rules and Regulation. Depending on how much medicine you are dependent on, you may experience withdrawals when unable to get the medication.  Recommendations: Consider ending your dependence on opioid pain medications. Ask your pain specialist to assist you with the process. Consider switching to a medication currently not in shortage, such as Buprenorphine. Talk to your pain specialist about this option. Consider decreasing your pain  medication requirements by managing tolerance thru "Drug Holidays". This may help minimize withdrawals, should you run out of medicine. Control your pain thru the use of non-pharmacological interventional therapies.   Your prescriber: Prescribers cannot be blamed for shortages. Medication manufacturing and supply issues cannot be fixed by the prescriber.   NOTE: The prescriber is not responsible for supplying the medication, or solving supply issues. Work with your pharmacist to solve it. The patient is responsible for the decision to take or continue taking the medication and for identifying and securing a legal supply source. By law, supplying the medication is the job and responsibility of the pharmacy. The prescriber is responsible for the evaluation, monitoring, and prescribing of these medications.   Prescribers will NOT: Re-issue prescriptions that have been partially filled. Re-issue prescriptions already sent to a pharmacy.  Re-send prescriptions to a different pharmacy because yours did not have your medication. Ask pharmacist to order more medicine or transfer the prescription to another pharmacy. (Read below.)  New 2023 regulation: "June 17, 2022 Revised Regulation Allows DEA-Registered Pharmacies to Transfer Electronic Prescriptions at a Patient's Request DEA Headquarters Division - Public Information Office Patients now have the ability to request their electronic prescription be transferred to another pharmacy without having to go back to their practitioner to initiate the request. This revised regulation went into effect on Monday, June 13, 2022.     At a patient's request, a DEA-registered retail pharmacy can now transfer an electronic prescription for a controlled substance (schedules II-V) to another DEA-registered retail pharmacy. Prior to this change, patients would have to go through their practitioner to cancel their prescription and have it re-issued to a different  pharmacy. The process was taxing and time consuming for both patients and practitioners.    The Drug Enforcement Administration [facility]) published its intent to revise the process for transferring electronic prescriptions on September 04, 2020.  The final rule was published in the federal register on May 12, 2022 and went into effect 30 days later.  Under the final rule, a prescription can only be transferred once between pharmacies, and only if allowed under existing state or other applicable law. The prescription must remain in its electronic form; may not be altered in any way; and the transfer must be communicated directly between two licensed pharmacists. It's important to note, any authorized refills transfer with the original prescription, which means the entire prescription will be filled at the same pharmacy".  Reference: [email or web address].is [facility] website announcement)  [email or web address] [employer] of Justice)   [employer] / Vol. 88, No. 143 / Thursday, May 12, 2022 / Rules and Regulations DEPARTMENT OF JUSTICE  Drug Enforcement Administration  21 CFR Part 1306  [Docket No. [id number]]  RIN [id number] Transfer of Electronic Prescriptions for Schedules II-V Controlled Substances Between  Pharmacies for Initial Filling  ______________________________________________________________________       ______________________________________________________________________    Transfer of Pain Medication between Pharmacies  Re: 2023 DEA Clarification on existing regulation  Published on DEA Website: June 17, 2022  Title: Revised Regulation Allows DEA-Registered Pharmacies to Electrical engineer Prescriptions at a Patient's Request DEA Headquarters Division - [employer]  "Patients now have the ability to  request their electronic prescription be transferred to another pharmacy without having to go back to their practitioner to initiate the request. This revised regulation went into effect on Monday, June 13, 2022.     At a patient's request, a DEA-registered retail pharmacy can now transfer an electronic prescription for a controlled substance (schedules II-V) to another DEA-registered retail pharmacy. Prior to this change, patients would have to go through their practitioner to cancel their prescription and have it re-issued to a different pharmacy. The process was taxing and time consuming for both patients and practitioners.    The Drug Enforcement Administration [facility]) published its intent to revise the process for transferring electronic prescriptions on September 04, 2020.  The final rule was published in the federal register on May 12, 2022 and went into effect 30 days later.  Under the final rule, a prescription can only be transferred once between pharmacies, and only if allowed under existing state or other applicable law. The prescription must remain in its electronic form; may not be altered in any way; and the transfer must be communicated directly between two licensed pharmacists. It's important to note, any authorized refills transfer with the original prescription, which means the entire prescription will be filled at the same pharmacy."    REFERENCES: 1. DEA website announcement [email or web address]  2. Department of Justice website  [email or web address]  3. DEPARTMENT OF JUSTICE Drug Enforcement Administration 21 CFR Part 1306 [Docket No. [id number]] RIN [id number] "Transfer of Electronic Prescriptions for Schedules II-V Controlled Substances Between Pharmacies for Initial  Filling"  ______________________________________________________________________       ______________________________________________________________________    Medication Rules  Purpose: To inform patients, and their family members, of our medication rules and regulations.  Applies to: All patients receiving prescriptions from our practice (written or electronic).  Pharmacy of record: This is the pharmacy where your electronic prescriptions will be sent. Make sure we have the correct one.  Electronic prescriptions: In compliance with the [facility] Strengthen Opioid Misuse Prevention (STOP) Act of 2017 (Session Conni Elliot [phone]), effective October 17, 2018, all controlled substances must be electronically prescribed. Written prescriptions, faxing, or calling prescriptions to a pharmacy will no longer be done.  Prescription refills: These will be provided only during in-person appointments. No medications will be renewed without a "face-to-face" evaluation with your provider. Applies to all prescriptions.  NOTE: The following applies primarily to controlled substances (Opioid* Pain Medications).   Type of encounter (visit): For patients receiving controlled substances, face-to-face visits are required. (Not an option and not up to the patient.)  Patient's Responsibilities: Pain Pills: Bring all pain pills to every appointment (except for procedure appointments). Pill counts are required.  Pill Bottles: Bring pills in original pharmacy bottle. Bring bottle, even if empty. Always bring the bottle of the most recent fill.  Medication refills: You are responsible for knowing and keeping track of what medications you are taking and when is it that you will need a refill. The day before your appointment: write a list of all prescriptions that need to be refilled. The day of the appointment: give the list to  the admitting nurse. Prescriptions will be written only during appointments. No  prescriptions will be written on procedure days. If you forget a medication: it will not be "Called in", "Faxed", or "electronically sent". You will need to get another appointment to get these prescribed. No early refills. Do not call asking to have your prescription filled early. Partial  or short prescriptions: Occasionally your pharmacy may not have enough pills to fill your prescription.  NEVER ACCEPT a partial fill or a prescription that is short of the total amount of pills that you were prescribed.  With controlled substances the law allows 72 hours for the pharmacy to complete the prescription.  If the prescription is not completed within 72 hours, the pharmacist will require a new prescription to be written. This means that you will be short on your medicine and we WILL NOT send another prescription to complete your original prescription.  Instead, request the pharmacy to send a carrier to a nearby branch to get enough medication to provide you with your full prescription. Prescription Accuracy: You are responsible for carefully inspecting your prescriptions before leaving our office. Have the discharge nurse carefully go over each prescription with you, before taking them home. Make sure that your name is accurately spelled, that your address is correct. Check the name and dose of your medication to make sure it is accurate. Check the number of pills, and the written instructions to make sure they are clear and accurate. Make sure that you are given enough medication to last until your next medication refill appointment. Taking Medication: Take medication as prescribed. When it comes to controlled substances, taking less pills or less frequently than prescribed is permitted and encouraged. Never take more pills than instructed. Never take the medication more frequently than prescribed.  Inform other Doctors: Always inform, all of your healthcare providers, of all the medications you take. Pain  Medication from other Providers: You are not allowed to accept any additional pain medication from any other Doctor or Healthcare provider. There are two exceptions to this rule. (see below) In the event that you require additional pain medication, you are responsible for notifying us, as stated below. Cough Medicine: Often these contain an opioid, such as codeine or hydrocodone. Never accept or take cough medicine containing these opioids if you are already taking an opioid* medication. The combination may cause respiratory failure and death. Medication Agreement: You are responsible for carefully reading and following our Medication Agreement. This must be signed before receiving any prescriptions from our practice. Safely store a copy of your signed Agreement. Violations to the Agreement will result in no further prescriptions. (Additional copies of our Medication Agreement are available upon request.) Laws, Rules, & Regulations: All patients are expected to follow all [address] and [employer], Rules, [place] [employer]. Ignorance of the Laws does not constitute a valid excuse.  Illegal drugs and Controlled Substances: The use of illegal substances (including, but not limited to marijuana and its derivatives) and/or the illegal use of any controlled substances is strictly prohibited. Violation of this rule may result in the immediate and permanent discontinuation of any and all prescriptions being written by our practice. The use of any illegal substances is prohibited. Adopted CDC guidelines & recommendations: Target dosing levels will be at or below 60 MME/day. Use of benzodiazepines** is not recommended. Urine Drug testing: Patients taking controlled substances will be required to provide a urine sample upon request. Do not void before coming to your medication management appointments.  Hold emptying your bladder until you are admitted. The admitting nurse will inform you if a sample is required. Our  practice reserves the right to call you at any time to provide a sample. Once receiving the call, you have 24 hours to comply with request. Not providing a sample upon request may result in termination of medication therapy.  Exceptions: There are only two exceptions to the rule of not receiving pain medications from other Healthcare Providers. Exception #1 (Emergencies): In the event of an emergency (i.e.: accident requiring emergency care), you are allowed to receive additional pain medication. However, you are responsible for: As soon as you are able, call our office [phone], at any time of the day or night, and leave a message stating your name, the date and nature of the emergency, and the name and dose of the medication prescribed. In the event that your call is answered by a member of our staff, make sure to document and save the date, time, and the name of the person that took your information.  Exception #2 (Planned Surgery): In the event that you are scheduled by another doctor or dentist to have any type of surgery or procedure, you are allowed (for a period no longer than 30 days), to receive additional pain medication, for the acute post-op pain. However, in this case, you are responsible for picking up a copy of our "Post-op Pain Management for Surgeons" handout, and giving it to your surgeon or dentist. This document is available at our office, and does not require an appointment to obtain it. Simply go to our office during business hours (Monday-Thursday from 8:00 AM to 4:00 PM) (Friday 8:00 AM to 12:00 Noon) or if you have a scheduled appointment with [place], prior to your surgery, and ask for it by name. In addition, you are responsible for: calling our office [phone], at any time of the day or night, and leaving a message stating your name, name of your surgeon, type of surgery, and date of procedure or surgery. Failure to comply with your responsibilities may result in termination  of therapy involving the controlled substances.  Consequences:  Non-compliance with the above rules may result in permanent discontinuation of medication prescription therapy. All patients receiving any type of controlled substance is expected to comply with the above patient responsibilities. Not doing so may result in permanent discontinuation of medication prescription therapy. Medication Agreement Violation. Following the above rules, including your responsibilities will help you in avoiding a Medication Agreement Violation ("Breaking your Pain Medication Contract").  *Opioid medications include: morphine, codeine, oxycodone, oxymorphone, hydrocodone, hydromorphone, meperidine, tramadol, tapentadol, buprenorphine, fentanyl, methadone. **Benzodiazepine medications include: diazepam (Valium), alprazolam (Xanax), clonazepam (Klonopine), lorazepam (Ativan), clorazepate (Tranxene), chlordiazepoxide (Librium), estazolam (Prosom), oxazepam (Serax), temazepam (Restoril), triazolam (Halcion) (Last updated: 08/09/2023) ______________________________________________________________________      ______________________________________________________________________    Medication Recommendations and Reminders  Applies to: All patients receiving prescriptions (written and/or electronic).  Medication Rules & Regulations: You are responsible for reading, knowing, and following our "Medication Rules" document. These exist for your safety and that of others. They are not flexible and neither are we. Dismissing or ignoring them is an act of "non-compliance" that may result in complete and irreversible termination of such medication therapy. For safety reasons, "non-compliance" will not be tolerated. As with the [place] fundamental legal principle of "ignorance of the law is no defense", we will accept no excuses for not having read and knowing the content of documents provided to you by  our practice.  Pharmacy of  record:  Definition: This is the pharmacy where your electronic prescriptions will be sent.  We do not endorse any particular pharmacy. It is up to you and your insurance to decide what pharmacy to use.  We do not restrict you in your choice of pharmacy. However, once we write for your prescriptions, we will NOT be re-sending more prescriptions to fix restricted supply problems created by your pharmacy, or your insurance.  The pharmacy listed in the electronic medical record should be the one where you want electronic prescriptions to be sent. If you choose to change pharmacy, simply notify our nursing staff. Changes will be made only during your regular appointments and not over the phone.  Recommendations: Keep all of your pain medications in a safe place, under lock and key, even if you live alone. We will NOT replace lost, stolen, or damaged medication. We do not accept "Police Reports" as proof of medications having been stolen. After you fill your prescription, take 1 week's worth of pills and put them away in a safe place. You should keep a separate, properly labeled bottle for this purpose. The remainder should be kept in the original bottle. Use this as your primary supply, until it runs out. Once it's gone, then you know that you have 1 week's worth of medicine, and it is time to come in for a prescription refill. If you do this correctly, it is unlikely that you will ever run out of medicine. To make sure that the above recommendation works, it is very important that you make sure your medication refill appointments are scheduled at least 1 week before you run out of medicine. To do this in an effective manner, make sure that you do not leave the office without scheduling your next medication management appointment. Always ask the nursing staff to show you in your prescription , when your medication will be running out. Then arrange for the receptionist to get you a return appointment, at least  7 days before you run out of medicine. Do not wait until you have 1 or 2 pills left, to come in. This is very poor planning and does not take into consideration that we may need to cancel appointments due to bad weather, sickness, or emergencies affecting our staff. DO NOT ACCEPT A "Partial Fill": If for any reason your pharmacy does not have enough pills/tablets to completely fill or refill your prescription, do not allow for a "partial fill". The law allows the pharmacy to complete that prescription within 72 hours, without requiring a new prescription. If they do not fill the rest of your prescription within those 72 hours, you will need a separate prescription to fill the remaining amount, which we will NOT provide. If the reason for the partial fill is your insurance, you will need to talk to the pharmacist about payment alternatives for the remaining tablets, but again, DO NOT ACCEPT A PARTIAL FILL, unless you can trust your pharmacist to obtain the remainder of the pills within 72 hours.  Prescription refills and/or changes in medication(s):  Prescription refills, and/or changes in dose or medication, will be conducted only during scheduled medication management appointments. (Applies to both, written and electronic prescriptions.) No refills on procedure days. No medication will be changed or started on procedure days. No changes, adjustments, and/or refills will be conducted on a procedure day. Doing so will interfere with the diagnostic portion of the procedure. No phone refills. No medications will be "  called into the pharmacy". No Fax refills. No weekend refills. No Holliday refills. No after hours refills.  Remember:  Business hours are:  Monday to Thursday 8:00 AM to 4:00 PM Provider's Schedule: Delano Metz, MD - Appointments are:  Medication management: Monday and Wednesday 8:00 AM to 4:00 PM Procedure day: Tuesday and Thursday 7:30 AM to 4:00 PM Edward Jolly, MD -  Appointments are:  Medication management: Tuesday and Thursday 8:00 AM to 4:00 PM Procedure day: Monday and Wednesday 7:30 AM to 4:00 PM (Last update: 08/09/2022) ______________________________________________________________________      ______________________________________________________________________     Naloxone Nasal Spray  Why am I receiving this medication? [place] STOP ACT requires that all patients taking high dose opioids or at risk of opioids respiratory depression, be prescribed an opioid reversal agent, such as Naloxone (AKA: Narcan).  What is this medication? NALOXONE (nal OX one) treats opioid overdose, which causes slow or shallow breathing, severe drowsiness, or trouble staying awake. Call emergency services after using this medication. You may need additional treatment. Naloxone works by reversing the effects of opioids. It belongs to a group of medications called opioid blockers.  COMMON BRAND NAME(S): Kloxxado, Narcan  What should I tell my care team before I take this medication? They need to know if you have any of these conditions: Heart disease Substance use disorder An unusual or allergic reaction to naloxone, other medications, foods, dyes, or preservatives Pregnant or trying to get pregnant Breast-feeding  When to use this medication? This medication is to be used for the treatment of respiratory depression (less than 8 breaths per minute) secondary to opioid overdose.   How to use this medication? This medication is for use in the nose. Lay the person on their back. Support their neck with your hand and allow the head to tilt back before giving the medication. The nasal spray should be given into 1 nostril. After giving the medication, move the person onto their side. Do not remove or test the nasal spray until ready to use. Get emergency medical help right away after giving the first dose of this medication, even if the person wakes up. You  should be familiar with how to recognize the signs and symptoms of a narcotic overdose. If more doses are needed, give the additional dose in the other nostril. Talk to your care team about the use of this medication in children. While this medication may be prescribed for children as young as newborns for selected conditions, precautions do apply.  Naloxone Overdosage: If you think you have taken too much of this medicine contact a poison control center or emergency room at once.  NOTE: This medicine is only for you. Do not share this medicine with others.  What if I miss a dose? This does not apply.  What may interact with this medication? This is only used during an emergency. No interactions are expected during emergency use. This list may not describe all possible interactions. Give your health care provider a list of all the medicines, herbs, non-prescription drugs, or dietary supplements you use. Also tell them if you smoke, drink alcohol, or use illegal drugs. Some items may interact with your medicine.  What should I watch for while using this medication? Keep this medication ready for use in the case of an opioid overdose. Make sure that you have the phone number of your care team and local hospital ready. You may need to have additional doses of this medication. Each nasal spray contains  a single dose. Some emergencies may require additional doses. After use, bring the treated person to the nearest hospital or call 911. Make sure the treating care team knows that the person has received a dose of this medication. You will receive additional instructions on what to do during and after use of this medication before an emergency occurs.  What side effects may I notice from receiving this medication? Side effects that you should report to your care team as soon as possible: Allergic reactions--skin rash, itching, hives, swelling of the face, lips, tongue, or throat Side effects that  usually do not require medical attention (report these to your care team if they continue or are bothersome): Constipation Dryness or irritation inside the nose Headache Increase in blood pressure Muscle spasms Stuffy nose Toothache This list may not describe all possible side effects. Call your doctor for medical advice about side effects. You may report side effects to FDA at 1-800-FDA-1088.  Where should I keep my medication? Because this is an emergency medication, you should keep it with you at all times.  Keep out of the reach of children and pets. Store between 20 and 25 degrees C (68 and 77 degrees F). Do not freeze. Throw away any unused medication after the expiration date. Keep in original box until ready to use.  NOTE: This sheet is a summary. It may not cover all possible information. If you have questions about this medicine, talk to your doctor, pharmacist, or health care provider.   2023 [employer]/Gold Standard (2021-06-11 00:00:00)  ______________________________________________________________________

## 2023-10-24 NOTE — Progress Notes (Signed)
 PROVIDER NOTE: Information contained herein reflects review and annotations entered in association with encounter. Interpretation of such information and data should be left to medically-trained personnel. Information provided to patient can be located elsewhere in the medical record under Patient Instructions. Document created using STT-dictation technology, any transcriptional errors that may result from process are unintentional.    Patient: Theresa [name]  Service Category: E/M  Provider: Eric DELENA Como, MD  DOB: Aug 09, 1956  DOS: 10/25/2023  Referring Provider: Odell Tor Pendleton *  MRN: [id number]  Specialty: Interventional Pain Management  PCP: Odell Tor, Pendleton GRADE, MD  Type: Established Patient  Setting: Ambulatory outpatient    Location: Office  Delivery: Face-to-face     HPI  [name], a 68 y.o. year old female, is here today because of her Chronic pain syndrome [G89.4]. [name] primary complain today is Back Pain (lower)  Pertinent problems: [name] has Neuritis or radiculitis due to rupture of lumbar intervertebral disc; DDD (degenerative disc disease), lumbar; Carpal tunnel syndrome; Neurogenic pain; Neuropathic pain; Musculoskeletal pain; Lumbar spondylosis (L4-5); Chronic low back pain (1ry area of Pain) (Bilateral) (R>L) w/o sciatica; Chronic lower extremity pain (2ry area of Pain) (Left); Chronic lumbar radicular pain (L4 & S1 Dermatome) (Left); Chronic shoulder pain (Right side); Chronic upper extremity pain (Right-sided); Cervical radiculitis (Right side); Abnormal x-ray of lumbar spine; Chronic pain syndrome (significant psychosocial component); 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; 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); Lumbar facet syndrome (Bilateral) (R>L); Chronic sacroiliac joint pain (Bilateral); Lumbar facet hypertrophy (L3-4, L5-S1) (Bilateral); Lumbar spinal stenosis (L4-5); Lumbar foraminal stenosis (L4-5)  (Left); 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); Lumbosacral radiculopathy at L4; Chronic shoulder pain (Left); Arthralgia of acromioclavicular joint (Right); Acromioclavicular joint DJD (Right); Osteoarthritis of shoulder (Right); Chronic hip pain (3ry area of Pain) (Bilateral) (L>R); Chronic lower extremity pain (Bilateral) (R>L); Cervicalgia (Bilateral) (R>L); Spondylosis without myelopathy or radiculopathy, lumbosacral region; Chronic knee pain (Left); Chronic left-sided lumbar radiculopathy; Abnormal MRI, lumbar spine (12/09/2021); Lumbar disc extrusion (L2-3) (Left); Chronic shoulder pain (Bilateral); Abnormal MRI, cervical spine; Trigger point with back pain; Lumbar trigger point syndrome; Lumbar facet joint pain; Grade 1 Anterolisthesis of lumbar spine (4mm) (L3/L4); Grade 1 Retrolisthesis (2mm) (L5/S1); Chronic hip pain (Right); Chronic groin pain (Right); Buttock pain (Right); and Greater trochanteric bursitis (Right) on their pertinent problem list. Pain Assessment: Severity of Chronic pain is reported as a 6 /10. Location: Back Lower/denies. Onset: More than a month ago. Quality: Aching, Dull. Timing: Intermittent. Modifying factor(s): sitting, heat. Vitals:  height is 5' 2 (1.575 m) and weight is 198 lb (89.8 kg). Her temporal temperature is 97.6 F (36.4 C). Her blood pressure is 136/86 and her pulse is 78. Her respiration is 18 and oxygen  saturation is 94%.  BMI: Estimated body mass index is 36.21 kg/m as calculated from the following:   Height as of this encounter: 5' 2 (1.575 m).   Weight as of this encounter: 198 lb (89.8 kg). Last encounter: 07/26/2023. Last procedure: 06/06/2023.  Reason for encounter: medication management. Routine UDS ordered today.   The patient indicates doing well with the current medication regimen. No adverse reactions or side effects reported to the medications.   Discussed the use of AI scribe software for clinical note transcription  with the patient, who gave verbal consent to proceed.  History of Present Illness   The patient, on chronic pain management, reports no adverse reactions or side effects to her current medication regimen.  She has recently undergone a urine test, as it had been over a year since the last one. The patient has made significant lifestyle changes, including weight loss and smoking cessation. She has lost weight, currently at 198 pounds, and has abstained from smoking for the past five months. These changes have resulted in some improvement in her breathing, although she notes that windy conditions can still pose a challenge. The patient acknowledges that previous weight loss had a positive impact on her respiratory function.     RTCB: 01/26/2024   Pharmacotherapy Assessment  Analgesic:  Hydrocodone /APAP 5/325 one tablet every 8 hours (15 mg/day of hydrocodone ) MME/day: 15 mg/day.   Monitoring: [place] PMP: PDMP reviewed during this encounter.       Pharmacotherapy: No side-effects or adverse reactions reported. Compliance: No problems identified. Effectiveness: Clinically acceptable.  Shela Reda CROME, RN  10/25/2023  1:28 PM  Sign when Signing Visit Nursing Pain Medication Assessment:  Safety precautions to be maintained throughout the outpatient stay will include: orient to surroundings, keep bed in low position, maintain call bell within reach at all times, provide assistance with transfer out of bed and ambulation.  Medication Inspection Compliance: Pill count conducted under aseptic conditions, in front of the patient. Neither the pills nor the bottle was removed from the patient's sight at any time. Once count was completed pills were immediately returned to the patient in their original bottle.  Medication: Hydrocodone /APAP Pill/Patch Count:  16 of 90 pills remain Pill/Patch Appearance: Markings consistent with prescribed medication Bottle Appearance: Standard pharmacy container. Clearly  labeled. Filled Date: 56 / 12 / 2024 Last Medication intake:  Today    No results found for: CBDTHCR No results found for: D8THCCBX No results found for: D9THCCBX  UDS:  Summary  Date Value Ref Range Status  10/26/2022 Note  Final    Comment:    ==================================================================== Tox[employer] Select 13 (MW) ==================================================================== Specimen Alert Note: Urinary creatinine is low; ability to detect some drugs may be compromised. Interpret results with caution. (Creatinine) ==================================================================== Test                             Result       Flag       Units  Drug Present and Declared for Prescription Verification   Hydrocodone                     3780         EXPECTED   ng/mg creat   Norhydrocodone                 1400         EXPECTED   ng/mg creat    Sources of hydrocodone  include scheduled prescription medications.    Norhydrocodone is an expected metabolite of hydrocodone .  Drug Absent but Declared for Prescription Verification   Butalbital                      Not Detected UNEXPECTED ==================================================================== Test                      Result    Flag   Units      Ref Range   Creatinine              10        LL     mg/dL      >=79 ==================================================================== Declared Medications:  The flagging and interpretation on this report are based on the  following declared medications.  Unexpected results may arise from  inaccuracies in the declared medications.   **Note: The testing scope of this panel includes these medications:   Butalbital  (Fioricet )  Hydrocodone  (Norco)   **Note: The testing scope of this panel does not include the  following reported medications:   Acetaminophen  (Tylenol )  Acetaminophen  (Fioricet )  Acetaminophen  (Norco)  Albuterol  (Ventolin  HFA)   Albuterol  (Duoneb)  Amiodarone   Buspirone  (Buspar )  Caffeine  (Fioricet )  Cholecalciferol   Dapagliflozin  (Farxiga )  Diclofenac  (Voltaren )  Diltiazem  (Cardizem )  Fluticasone  (Trelegy)  Helium  Ipratropium (Duoneb)  Iron   Metolazone  (Zaroxolyn )  Naloxone  (Narcan )  Oxygen   Pantoprazole  (Protonix )  Potassium (Klor-Con )  Rivaroxaban  (Xarelto )  Roflumilast  (Daliresp )  Sertraline  (Zoloft )  Simvastatin  (Zocor )  Spironolactone  (Aldactone )  Torsemide  (Demadex )  Umeclidinium (Trelegy)  Varenicline  (Chantix )  Vilanterol (Trelegy) ==================================================================== For clinical consultation, please call [phone]. ====================================================================       ROS  Constitutional: Denies any fever or chills Gastrointestinal: No reported hemesis, hematochezia, vomiting, or acute GI distress Musculoskeletal: Denies any acute onset joint swelling, redness, loss of ROM, or weakness Neurological: No reported episodes of acute onset apraxia, aphasia, dysarthria, agnosia, amnesia, paralysis, loss of coordination, or loss of consciousness  Medication Review  Cholecalciferol , Fluticasone -Umeclidin-Vilant, Furosemide , HYDROcodone -acetaminophen , Semaglutide -Weight Management, acetaminophen , albuterol , amiodarone , dapagliflozin  propanediol, diclofenac  Sodium, diltiazem , ipratropium-albuterol , melatonin, naloxone , omeprazole , polyethylene glycol, potassium chloride  SA, roflumilast , sertraline , spironolactone , and torsemide   History Review  Allergy: [name] is allergic to gabapentin , codeine, and meloxicam . Drug: [name]  reports current drug use. Alcohol:  reports no history of alcohol use. Tobacco:  reports that she has quit smoking. Her smoking use included cigarettes. She has a 66 pack-year smoking history. She has never used smokeless tobacco. Social: [name]  reports that she has quit smoking. Her smoking use  included cigarettes. She has a 66 pack-year smoking history. She has never used smokeless tobacco. She reports current drug use. She reports that she does not drink alcohol. Medical:  has a past medical history of Acute postoperative pain (10/03/2017), Anxiety, BiPAP (biphasic positive airway pressure) dependence, Carpal tunnel syndrome, CHF (congestive heart failure) (HCC), CHF (congestive heart failure) (HCC), Chronic generalized abdominal pain, Chronic rhinitis, COPD (chronic obstructive pulmonary disease) (HCC), DDD (degenerative disc disease), cervical, DDD (degenerative disc disease), lumbosacral, Depression, Dyspnea, Edema, Flu, Gastritis, GERD (gastroesophageal reflux disease), Hematuria, Hernia of abdominal wall (07/17/2018), Hernia, abdominal, Hypertension, Kidney stones, Low back pain, Lumbar radiculitis, Malodorous urine, Muscle weakness, Obesity, Oxygen  dependent, Pneumonia due to COVID-19 virus (02/03/2020), Renal cyst, Sensory urge incontinence, Thyroid  activity decreased, Tobacco abuse, and Wheezing. Surgical: Ms. Conlee  has a past surgical history that includes Abdominal hysterectomy; Tonsillectomy; Carpal tunnel release (Left, 2012); Tubal ligation; epigastric hernia repair (N/A, 08/13/2018); Umbilical hernia repair (N/A, 08/13/2018); RIGHT/LEFT HEART CATH AND CORONARY ANGIOGRAPHY (N/A, 07/11/2019); TEE without cardioversion (N/A, 04/29/2022); Cardioversion (N/A, 04/29/2022); Esophagogastroduodenoscopy (N/A, 06/21/2022); and cataract surgery (Bilateral). Family: family history includes Alcohol abuse in her father; Breast cancer in her sister; Cancer in her brother, brother, and sister; Coronary artery disease in her mother; Heart disease in her father and mother; Kidney cancer in her brother; Lung cancer in her sister; Pneumonia in her brother; Stroke in her mother.  Laboratory Chemistry Profile   Renal Lab Results  Component Value Date   BUN 14 08/29/2023   CREATININE 0.87 08/29/2023    BCR 20 06/05/2023   GFRAA >60 06/26/2020   GFRNONAA >60 08/29/2023    Hepatic  Lab Results  Component Value Date   AST 23 11/05/2022   ALT 18 11/05/2022   ALBUMIN 3.5 11/05/2022   ALKPHOS 82 11/05/2022   LIPASE 57 (H) 06/19/2022   AMMONIA 40 (H) 10/11/2019    Electrolytes Lab Results  Component Value Date   NA 139 08/29/2023   K 3.5 08/29/2023   CL 107 08/29/2023   CALCIUM  8.7 (L) 08/29/2023   MG 2.8 (H) 11/08/2022   PHOS 3.2 06/21/2022    Bone Lab Results  Component Value Date   25OHVITD1 13 (L) 09/17/2015   25OHVITD2 <1.0 09/17/2015   25OHVITD3 13 09/17/2015    Inflammation (CRP: Acute Phase) (ESR: Chronic Phase) Lab Results  Component Value Date   CRP 0.9 04/02/2022   ESRSEDRATE 17 04/06/2022   LATICACIDVEN 0.9 04/01/2022         Note: Above Lab results reviewed.  Recent Imaging Review  DG Chest 1 View CLINICAL DATA:  Positive COVID test with shortness of breath and fatigue.  EXAM: CHEST  1 VIEW  COMPARISON:  November 09, 2022  FINDINGS: The cardiac silhouette is mildly enlarged and unchanged in size. Mild, diffuse, chronic appearing increased interstitial lung markings are noted. Mild right infrahilar and left basilar atelectasis and/or infiltrate is seen. No pleural effusion or pneumothorax is identified. The visualized skeletal structures are unremarkable.  IMPRESSION: Mild right infrahilar and left basilar atelectasis and/or infiltrate.  Electronically Signed   By: Suzen Dials M.D.   On: 08/29/2023 21:46 Note: Reviewed        Physical Exam  General appearance: Well nourished, well developed, and well hydrated. In no apparent acute distress Mental status: Alert, oriented x 3 (person, place, & time)       Respiratory: No evidence of acute respiratory distress Eyes: PERLA Vitals: BP 136/86   Pulse 78   Temp 97.6 F (36.4 C) (Temporal)   Resp 18   Ht 5' 2 (1.575 m)   Wt 198 lb (89.8 kg)   SpO2 94% Comment: O2 at 4 liters [facility]   BMI 36.21 kg/m  BMI: Estimated body mass index is 36.21 kg/m as calculated from the following:   Height as of this encounter: 5' 2 (1.575 m).   Weight as of this encounter: 198 lb (89.8 kg). Ideal: Ideal body weight: 50.1 kg (110 lb 7.2 oz) Adjusted ideal body weight: 66 kg (145 lb 7.5 oz)  Assessment   Diagnosis Status  1. Chronic pain syndrome (significant psychosocial component)   2. Chronic low back pain (1ry area of Pain) (Bilateral) (L>R) w/o sciatica   3. Chronic lower extremity pain (2ry area of Pain) (Left)   4. Chronic hip pain (3ry area of Pain) (Bilateral) (L>R)   5. Lumbar facet syndrome (Bilateral) (L>R)   6. Chronic knee pain (Left)   7. Chronic lower extremity pain (Bilateral) (L>R)   8. Pharmacologic therapy   9. Chronic use of opiate for therapeutic purpose   10. Encounter for medication management   11. Encounter for chronic pain management    Controlled Controlled Controlled   Updated Problems: No problems updated.  Plan of Care  Problem-specific:  Assessment and Plan    Chronic Pain Management   She is currently on a pain management regimen with no reported side effects or adverse reactions. An updated urine drug screen was conducted today, marking over a year since the last one. We will send a prescription to the pharmacy for the next three months and schedule a follow-up appointment in three  months.  Weight Management   She has successfully lost weight, now weighing 198 pounds, with a noted improvement in breathing. We will encourage continued weight loss efforts.  Smoking Cessation   She has not smoked for five months, likely contributing to her improved breathing. We will encourage continued smoking cessation.       [name] has a current medication list which includes the following long-term medication(s): albuterol , amiodarone , [START ON 10/28/2023] hydrocodone -acetaminophen , [START ON 11/27/2023] hydrocodone -acetaminophen , [START ON  12/27/2023] hydrocodone -acetaminophen , ipratropium-albuterol , potassium chloride  sa, sertraline , and spironolactone .  Pharmacotherapy (Medications Ordered): Meds ordered this encounter  Medications   HYDROcodone -acetaminophen  (NORCO/VICODIN) 5-325 MG tablet    Sig: Take 1 tablet by mouth every 8 (eight) hours as needed for severe pain (pain score 7-10). Must last 30 days    Dispense:  90 tablet    Refill:  0    DO NOT: delete (not duplicate); no partial-fill (will deny script to complete), no refill request (F/U required). DISPENSE: 1 day early if closed on fill date. WARN: No CNS-depressants within 8 hrs of med.   HYDROcodone -acetaminophen  (NORCO/VICODIN) 5-325 MG tablet    Sig: Take 1 tablet by mouth every 8 (eight) hours as needed for severe pain (pain score 7-10). Must last 30 days    Dispense:  90 tablet    Refill:  0    DO NOT: delete (not duplicate); no partial-fill (will deny script to complete), no refill request (F/U required). DISPENSE: 1 day early if closed on fill date. WARN: No CNS-depressants within 8 hrs of med.   HYDROcodone -acetaminophen  (NORCO/VICODIN) 5-325 MG tablet    Sig: Take 1 tablet by mouth every 8 (eight) hours as needed for severe pain (pain score 7-10). Must last 30 days    Dispense:  90 tablet    Refill:  0    DO NOT: delete (not duplicate); no partial-fill (will deny script to complete), no refill request (F/U required). DISPENSE: 1 day early if closed on fill date. WARN: No CNS-depressants within 8 hrs of med.   Orders:  Orders Placed This Encounter  Procedures   ToxASSURE Select 13 (MW), Urine    Volume: 30 ml(s). Minimum 3 ml of urine is needed. Document temperature of fresh sample. Indications: Long term (current) use of opiate analgesic (S20.108)    Release to patient:   Immediate   Follow-up plan:   Return in about 3 months (around 01/26/2024) for Eval-day (M,W), (F2F), (MM).      Interventional Therapies  Risk Factors  Considerations  Medical  Comorbidities:  COPD  Emphysema  Chronic Smoker  O2 dependent  SOB  CHF  Hx. of A-Fib (Cardioverted)  Obesity  CAD   NOTE: NO further Lumbar RFA until BMI <35. Patient taken off of Xarelto  after cardioversion.   Planned  Pending:   Diagnostic/therapeutic right IA hip and trochanteric bursa inj. #1    Under consideration:   Diagnostic bilateral cervical facet MBB #1  Palliative left L2-3 LESI #4  NOTE: NO further Lumbar RFA until BMI <35.   Completed:   Palliative right lumbar facet MBB x7 (09/20/2022) (100/100/100x5days/0)  Palliative left lumbar facet MBB x6 (11/11/2021) (100/100/0/0)  Therapeutic left L2-3 LESI x4 (05/09/2023) (8-0/10) (100/100/50/>50)  Diagnostic/palliative left L4 TFESI x1 (12/26/2017) (100/100/50/>50)  Diagnostic/palliative left L4-5 LESI x2 (07/30/2019) (100/100/0/0)  Diagnostic/palliative right L4 TFESI x1 (01/25/2018) (100/100/50/50)  Diagnostic/palliative right L4-5 LESI x1 (01/25/2018) (100/100/50/50)  Palliative left lumbar facet RFA x1 (06/27/2017) (0/0/100/>50)  Palliative right lumbar facet RFA  x1 (10/03/2017) (100/100/75)    Therapeutic  Palliative (PRN) options:   Evaluation needed before each determination.   Completed by other providers:      Pharmacotherapy  Nonopioids transferred 08/03/2020: Gabapentin  and baclofen  Note: Patient taken off of Xarelto  after cardioversion.      Recent Visits No visits were found meeting these conditions. Showing recent visits within past 90 days and meeting all other requirements Today's Visits Date Type Provider Dept  10/25/23 Office Visit Tanya Glisson, MD [facility]  Showing today's visits and meeting all other requirements Future Appointments No visits were found meeting these conditions. Showing future appointments within next 90 days and meeting all other requirements  I discussed the assessment and treatment plan with the patient. The patient was provided an opportunity to  ask questions and all were answered. The patient agreed with the plan and demonstrated an understanding of the instructions.  Patient advised to call back or seek an in-person evaluation if the symptoms or condition worsens.  Duration of encounter: 30 minutes.  Total time on encounter, as per AMA guidelines included both the face-to-face and non-face-to-face time personally spent by the physician and/or other qualified health care professional(s) on the day of the encounter (includes time in activities that require the physician or other qualified health care professional and does not include time in activities normally performed by clinical staff). Physician's time may include the following activities when performed: Preparing to see the patient (e.g., pre-charting review of records, searching for previously ordered imaging, lab work, and nerve conduction tests) Review of prior analgesic pharmacotherapies. Reviewing PMP Interpreting ordered tests (e.g., lab work, imaging, nerve conduction tests) Performing post-procedure evaluations, including interpretation of diagnostic procedures Obtaining and/or reviewing separately obtained history Performing a medically appropriate examination and/or evaluation Counseling and educating the patient/family/caregiver Ordering medications, tests, or procedures Referring and communicating with other health care professionals (when not separately reported) Documenting clinical information in the electronic or other health record Independently interpreting results (not separately reported) and communicating results to the patient/ family/caregiver Care coordination (not separately reported)  Note by: Glisson DELENA Tanya, MD Date: 10/25/2023; Time: 3:49 PM

## 2023-10-25 ENCOUNTER — Ambulatory Visit: Payer: Medicare HMO | Attending: Pain Medicine | Admitting: Pain Medicine

## 2023-10-25 ENCOUNTER — Encounter: Payer: Self-pay | Admitting: Pain Medicine

## 2023-10-25 VITALS — BP 136/86 | HR 78 | Temp 97.6°F | Resp 18 | Ht 62.0 in | Wt 198.0 lb

## 2023-10-25 DIAGNOSIS — M47816 Spondylosis without myelopathy or radiculopathy, lumbar region: Secondary | ICD-10-CM | POA: Diagnosis not present

## 2023-10-25 DIAGNOSIS — M25562 Pain in left knee: Secondary | ICD-10-CM | POA: Diagnosis not present

## 2023-10-25 DIAGNOSIS — M25551 Pain in right hip: Secondary | ICD-10-CM | POA: Diagnosis not present

## 2023-10-25 DIAGNOSIS — G8929 Other chronic pain: Secondary | ICD-10-CM | POA: Diagnosis not present

## 2023-10-25 DIAGNOSIS — M545 Low back pain, unspecified: Secondary | ICD-10-CM | POA: Insufficient documentation

## 2023-10-25 DIAGNOSIS — G894 Chronic pain syndrome: Secondary | ICD-10-CM | POA: Insufficient documentation

## 2023-10-25 DIAGNOSIS — M79604 Pain in right leg: Secondary | ICD-10-CM | POA: Diagnosis not present

## 2023-10-25 DIAGNOSIS — M25552 Pain in left hip: Secondary | ICD-10-CM | POA: Insufficient documentation

## 2023-10-25 DIAGNOSIS — Z79891 Long term (current) use of opiate analgesic: Secondary | ICD-10-CM | POA: Insufficient documentation

## 2023-10-25 DIAGNOSIS — M79605 Pain in left leg: Secondary | ICD-10-CM | POA: Diagnosis not present

## 2023-10-25 DIAGNOSIS — Z79899 Other long term (current) drug therapy: Secondary | ICD-10-CM | POA: Insufficient documentation

## 2023-10-25 MED ORDER — HYDROCODONE-ACETAMINOPHEN 5-325 MG PO TABS: 1.0000 | ORAL_TABLET | Freq: Three times a day (TID) | ORAL | 0 refills | Status: DC | PRN

## 2023-10-25 NOTE — Progress Notes (Signed)
 Nursing Pain Medication Assessment:  Safety precautions to be maintained throughout the outpatient stay will include: orient to surroundings, keep bed in low position, maintain call bell within reach at all times, provide assistance with transfer out of bed and ambulation.  Medication Inspection Compliance: Pill count conducted under aseptic conditions, in front of the patient. Neither the pills nor the bottle was removed from the patient's sight at any time. Once count was completed pills were immediately returned to the patient in their original bottle.  Medication: Hydrocodone /APAP Pill/Patch Count:  16 of 90 pills remain Pill/Patch Appearance: Markings consistent with prescribed medication Bottle Appearance: Standard pharmacy container. Clearly labeled. Filled Date: 25 / 12 / 2024 Last Medication intake:  Today

## 2023-10-30 LAB — TOXASSURE SELECT 13 (MW), URINE

## 2023-11-08 DIAGNOSIS — J449 Chronic obstructive pulmonary disease, unspecified: Secondary | ICD-10-CM | POA: Diagnosis not present

## 2023-11-10 DIAGNOSIS — R8290 Unspecified abnormal findings in urine: Secondary | ICD-10-CM | POA: Diagnosis not present

## 2023-11-16 DIAGNOSIS — J9611 Chronic respiratory failure with hypoxia: Secondary | ICD-10-CM | POA: Diagnosis not present

## 2023-11-16 DIAGNOSIS — J432 Centrilobular emphysema: Secondary | ICD-10-CM | POA: Diagnosis not present

## 2023-11-16 DIAGNOSIS — J439 Emphysema, unspecified: Secondary | ICD-10-CM | POA: Diagnosis not present

## 2023-11-16 DIAGNOSIS — R0602 Shortness of breath: Secondary | ICD-10-CM | POA: Diagnosis not present

## 2023-11-16 DIAGNOSIS — J441 Chronic obstructive pulmonary disease with (acute) exacerbation: Secondary | ICD-10-CM | POA: Diagnosis not present

## 2023-11-16 DIAGNOSIS — R040 Epistaxis: Secondary | ICD-10-CM | POA: Diagnosis not present

## 2023-11-16 DIAGNOSIS — R600 Localized edema: Secondary | ICD-10-CM | POA: Diagnosis not present

## 2023-11-16 DIAGNOSIS — I4891 Unspecified atrial fibrillation: Secondary | ICD-10-CM | POA: Diagnosis not present

## 2023-11-16 DIAGNOSIS — I272 Pulmonary hypertension, unspecified: Secondary | ICD-10-CM | POA: Diagnosis not present

## 2023-11-16 DIAGNOSIS — M7989 Other specified soft tissue disorders: Secondary | ICD-10-CM | POA: Diagnosis not present

## 2023-11-18 ENCOUNTER — Encounter: Payer: Self-pay | Admitting: Oncology

## 2023-11-20 ENCOUNTER — Telehealth: Payer: Self-pay

## 2023-11-20 ENCOUNTER — Telehealth: Payer: Self-pay | Admitting: Pharmacist

## 2023-11-20 ENCOUNTER — Other Ambulatory Visit (HOSPITAL_COMMUNITY): Payer: Self-pay

## 2023-11-20 MED ORDER — MOUNJARO 5 MG/0.5ML ~~LOC~~ SOAJ: 5.0000 mg | SUBCUTANEOUS | 0 refills | Status: DC

## 2023-11-20 NOTE — Telephone Encounter (Signed)
Patient Advocate Encounter  Test claims for GLP-1 products returned the following results:  Mounjaro - $0 Ozempic - $0 Trulicity - $0  Saxenda - Not covered ZOXWRU - Not covered Zepbound - Not covered  Burnell Blanks, CPhT Rx Patient Advocate Phone: [phone]

## 2023-11-20 NOTE — Telephone Encounter (Signed)
Patient Advocate Encounter  Received notification that Dapagliflozin requires prior authorization. Test claims show that this plan prefers brand name Marcelline Deist, and returns a refill too soon date, indicating pharmacy has already corrected and filled for brand.  No prior auth submitted at this time.  Burnell Blanks, CPhT Rx Patient Advocate Phone: [phone]

## 2023-11-20 NOTE — Telephone Encounter (Signed)
Patient's insurance no longer covers [employer]. Spoke with patient and will transition to [facility] 5 mg weekly.

## 2023-11-21 ENCOUNTER — Encounter: Payer: Medicare HMO | Admitting: Family

## 2023-11-23 DIAGNOSIS — J9610 Chronic respiratory failure, unspecified whether with hypoxia or hypercapnia: Secondary | ICD-10-CM | POA: Diagnosis not present

## 2023-12-02 DIAGNOSIS — Z8249 Family history of ischemic heart disease and other diseases of the circulatory system: Secondary | ICD-10-CM | POA: Diagnosis not present

## 2023-12-02 DIAGNOSIS — I4891 Unspecified atrial fibrillation: Secondary | ICD-10-CM | POA: Diagnosis not present

## 2023-12-02 DIAGNOSIS — K219 Gastro-esophageal reflux disease without esophagitis: Secondary | ICD-10-CM | POA: Diagnosis not present

## 2023-12-02 DIAGNOSIS — I130 Hypertensive heart and chronic kidney disease with heart failure and stage 1 through stage 4 chronic kidney disease, or unspecified chronic kidney disease: Secondary | ICD-10-CM | POA: Diagnosis not present

## 2023-12-02 DIAGNOSIS — J439 Emphysema, unspecified: Secondary | ICD-10-CM | POA: Diagnosis not present

## 2023-12-02 DIAGNOSIS — D6869 Other thrombophilia: Secondary | ICD-10-CM | POA: Diagnosis not present

## 2023-12-02 DIAGNOSIS — M069 Rheumatoid arthritis, unspecified: Secondary | ICD-10-CM | POA: Diagnosis not present

## 2023-12-02 DIAGNOSIS — J9611 Chronic respiratory failure with hypoxia: Secondary | ICD-10-CM | POA: Diagnosis not present

## 2023-12-02 DIAGNOSIS — M1990 Unspecified osteoarthritis, unspecified site: Secondary | ICD-10-CM | POA: Diagnosis not present

## 2023-12-02 DIAGNOSIS — I272 Pulmonary hypertension, unspecified: Secondary | ICD-10-CM | POA: Diagnosis not present

## 2023-12-02 DIAGNOSIS — D6949 Other primary thrombocytopenia: Secondary | ICD-10-CM | POA: Diagnosis not present

## 2023-12-02 DIAGNOSIS — Z008 Encounter for other general examination: Secondary | ICD-10-CM | POA: Diagnosis not present

## 2023-12-06 ENCOUNTER — Encounter: Payer: Medicare HMO | Admitting: Family

## 2023-12-09 DIAGNOSIS — J449 Chronic obstructive pulmonary disease, unspecified: Secondary | ICD-10-CM | POA: Diagnosis not present

## 2023-12-14 ENCOUNTER — Other Ambulatory Visit (HOSPITAL_COMMUNITY): Payer: Self-pay | Admitting: Cardiology

## 2023-12-15 ENCOUNTER — Telehealth: Payer: Self-pay | Admitting: Pharmacist

## 2023-12-15 MED ORDER — MOUNJARO 7.5 MG/0.5ML ~~LOC~~ SOAJ: 7.5000 mg | SUBCUTANEOUS | 0 refills | Status: DC

## 2023-12-15 NOTE — Telephone Encounter (Signed)
 Patient requesting refills. Patient reports tolerating Mounjaro 5 mg weekly well, without issue. Will send in the 7.5 mg dose.

## 2023-12-21 DIAGNOSIS — J9610 Chronic respiratory failure, unspecified whether with hypoxia or hypercapnia: Secondary | ICD-10-CM | POA: Diagnosis not present

## 2023-12-22 DIAGNOSIS — J441 Chronic obstructive pulmonary disease with (acute) exacerbation: Secondary | ICD-10-CM | POA: Diagnosis not present

## 2023-12-22 DIAGNOSIS — R296 Repeated falls: Secondary | ICD-10-CM | POA: Diagnosis not present

## 2023-12-22 DIAGNOSIS — J9621 Acute and chronic respiratory failure with hypoxia: Secondary | ICD-10-CM | POA: Diagnosis not present

## 2023-12-22 DIAGNOSIS — R040 Epistaxis: Secondary | ICD-10-CM | POA: Diagnosis not present

## 2023-12-22 DIAGNOSIS — I5032 Chronic diastolic (congestive) heart failure: Secondary | ICD-10-CM | POA: Diagnosis not present

## 2023-12-22 DIAGNOSIS — J9611 Chronic respiratory failure with hypoxia: Secondary | ICD-10-CM | POA: Diagnosis not present

## 2023-12-22 DIAGNOSIS — J432 Centrilobular emphysema: Secondary | ICD-10-CM | POA: Diagnosis not present

## 2023-12-22 DIAGNOSIS — R0602 Shortness of breath: Secondary | ICD-10-CM | POA: Diagnosis not present

## 2023-12-22 DIAGNOSIS — I272 Pulmonary hypertension, unspecified: Secondary | ICD-10-CM | POA: Diagnosis not present

## 2023-12-22 DIAGNOSIS — I4891 Unspecified atrial fibrillation: Secondary | ICD-10-CM | POA: Diagnosis not present

## 2023-12-22 DIAGNOSIS — R600 Localized edema: Secondary | ICD-10-CM | POA: Diagnosis not present

## 2024-01-04 ENCOUNTER — Other Ambulatory Visit: Payer: Self-pay | Admitting: Family

## 2024-01-05 DIAGNOSIS — I4891 Unspecified atrial fibrillation: Secondary | ICD-10-CM | POA: Diagnosis not present

## 2024-01-06 DIAGNOSIS — J449 Chronic obstructive pulmonary disease, unspecified: Secondary | ICD-10-CM | POA: Diagnosis not present

## 2024-01-08 ENCOUNTER — Telehealth: Payer: Self-pay | Admitting: Pharmacist

## 2024-01-08 MED ORDER — MOUNJARO 10 MG/0.5ML ~~LOC~~ SOAJ: 10.0000 mg | SUBCUTANEOUS | 0 refills | Status: DC

## 2024-01-08 NOTE — Telephone Encounter (Signed)
 Patient tolerating GLP-1RA well without issue. Increase dose to 10 mg weekly.

## 2024-01-09 DIAGNOSIS — R0609 Other forms of dyspnea: Secondary | ICD-10-CM | POA: Diagnosis not present

## 2024-01-09 DIAGNOSIS — Z9981 Dependence on supplemental oxygen: Secondary | ICD-10-CM | POA: Diagnosis not present

## 2024-01-09 DIAGNOSIS — J449 Chronic obstructive pulmonary disease, unspecified: Secondary | ICD-10-CM | POA: Diagnosis not present

## 2024-01-09 DIAGNOSIS — R0689 Other abnormalities of breathing: Secondary | ICD-10-CM | POA: Diagnosis not present

## 2024-01-12 DIAGNOSIS — F17200 Nicotine dependence, unspecified, uncomplicated: Secondary | ICD-10-CM | POA: Diagnosis not present

## 2024-01-12 DIAGNOSIS — I272 Pulmonary hypertension, unspecified: Secondary | ICD-10-CM | POA: Diagnosis not present

## 2024-01-12 DIAGNOSIS — R040 Epistaxis: Secondary | ICD-10-CM | POA: Diagnosis not present

## 2024-01-12 DIAGNOSIS — R600 Localized edema: Secondary | ICD-10-CM | POA: Diagnosis not present

## 2024-01-12 DIAGNOSIS — J441 Chronic obstructive pulmonary disease with (acute) exacerbation: Secondary | ICD-10-CM | POA: Diagnosis not present

## 2024-01-12 DIAGNOSIS — I4891 Unspecified atrial fibrillation: Secondary | ICD-10-CM | POA: Diagnosis not present

## 2024-01-12 DIAGNOSIS — J9611 Chronic respiratory failure with hypoxia: Secondary | ICD-10-CM | POA: Diagnosis not present

## 2024-01-12 DIAGNOSIS — R296 Repeated falls: Secondary | ICD-10-CM | POA: Diagnosis not present

## 2024-01-12 DIAGNOSIS — J432 Centrilobular emphysema: Secondary | ICD-10-CM | POA: Diagnosis not present

## 2024-01-12 DIAGNOSIS — R0602 Shortness of breath: Secondary | ICD-10-CM | POA: Diagnosis not present

## 2024-01-12 DIAGNOSIS — I071 Rheumatic tricuspid insufficiency: Secondary | ICD-10-CM | POA: Diagnosis not present

## 2024-01-23 NOTE — Patient Instructions (Signed)
 ______________________________________________________________________    New Medication Management Provider - Randalyn Rhea, NP  Purpose: To inform patients of the addition of a new member to our group, Randalyn Rhea, NP.  Applies to: All patients receiving prescriptions from our practice (written or electronic).  Announcement: We are happy to announce the addition of Randalyn Rhea, NP to or practice ([facility]).  She will take up a support role assisting our interventional pain specialists in the management of our patients.  She will be primarily assigned to the medication management portion of our practice.  Physician assignment: Patient will continue to be assigned to their current Pain Specialist Physician however, Ms. Allena Katz, NP will take over the Medication Management visits along with the responsibilities associated with such visits.  Medication Management: Any questions or inquiries associated with the day-to-day management of your pain medications, refills, or anything else associated with those prescriptions should be directed to Ms. Randalyn Rhea, NP.  Interventional Therapies: All issues associated with these therapies will continue to be managed by your Primary Pain Specialist.   Requesting appointments: When requesting that appointment, please make sure to specify whether the appointment is for Medication Management (to be scheduled with Ms. Allena Katz, NP) or if an evaluation is required/desired with your Primary Pain Specialist.  (Last updated: 01/16/2024) ______________________________________________________________________     ______________________________________________________________________    Procedure instructions  Stop blood-thinners  Do not eat or drink fluids (other than water) for 6 hours before your procedure  No water for 2 hours before your procedure  Take your blood pressure medicine with a sip of  water  Arrive 30 minutes before your appointment  If sedation is planned, bring suitable driver. Pennie Banter, Benedetto Goad, & public transportation are NOT APPROVED)  Carefully read the "Preparing for your procedure" detailed instructions  If you have questions call us at [phone]  Procedure appointments are for procedures only. NO medication refills or new problem evaluations.   ______________________________________________________________________      ______________________________________________________________________    Preparing for your procedure  Appointments: If you think you may not be able to keep your appointment, call 24-48 hours in advance to cancel. We need time to make it available to others.  Procedure visits are for procedures only. During your procedure appointment there will be: NO Prescription Refills*. NO medication changes or discussions*. NO discussion of disability issues*. NO unrelated pain problem evaluations*. NO evaluations to order other pain procedures*. *These will be addressed at a separate and distinct evaluation encounter on the provider's evaluation schedule and not during procedure days.  Instructions: Food intake: Avoid eating anything solid for at least 8 hours prior to your procedure. Clear liquid intake: You may take clear liquids such as water up to 2 hours prior to your procedure. (No carbonated drinks. No soda.) Transportation: Unless otherwise stated by your physician, bring a driver. (Driver cannot be a Market researcher, Pharmacist, community, or any other form of public transportation.) Morning Medicines: Except for blood thinners, take all of your other morning medications with a sip of water. Make sure to take your heart and blood pressure medicines. If your blood pressure's lower number is above 100, the case will be rescheduled. Blood thinners: Make sure to stop your blood thinners as instructed.  If you take a blood thinner, but were not instructed to stop it, call  our office [phone] and ask to talk to a nurse. Not stopping a blood thinner prior to certain procedures could lead to serious complications. Diabetics on  insulin: Notify the staff so that you can be scheduled 1st case in the morning. If your diabetes requires high dose insulin, take only  of your normal insulin dose the morning of the procedure and notify the staff that you have done so. Preventing infections: Shower with an antibacterial soap the morning of your procedure.  Build-up your immune system: Take 1000 mg of Vitamin C with every meal (3 times a day) the day prior to your procedure. Antibiotics: Inform the nursing staff if you are taking any antibiotics or if you have any conditions that may require antibiotics prior to procedures. (Example: recent joint implants)   Pregnancy: If you are pregnant make sure to notify the nursing staff. Not doing so may result in injury to the fetus, including death.  Sickness: If you have a cold, fever, or any active infections, call and cancel or reschedule your procedure. Receiving steroids while having an infection may result in complications. Arrival: You must be in the facility at least 30 minutes prior to your scheduled procedure. Tardiness: Your scheduled time is also the cutoff time. If you do not arrive at least 15 minutes prior to your procedure, you will be rescheduled.  Children: Do not bring any children with you. Make arrangements to keep them home. Dress appropriately: There is always a possibility that your clothing may get soiled. Avoid long dresses. Valuables: Do not bring any jewelry or valuables.  Reasons to call and reschedule or cancel your procedure: (Following these recommendations will minimize the risk of a serious complication.) Surgeries: Avoid having procedures within 2 weeks of any surgery. (Avoid for 2 weeks before or after any surgery). Flu Shots: Avoid having procedures within 2 weeks of a flu shots or . (Avoid for 2  weeks before or after immunizations). Barium: Avoid having a procedure within 7-10 days after having had a radiological study involving the use of radiological contrast. (Myelograms, Barium swallow or enema study). Heart attacks: Avoid any elective procedures or surgeries for the initial 6 months after a "Myocardial Infarction" (Heart Attack). Blood thinners: It is imperative that you stop these medications before procedures. Let us know if you if you take any blood thinner.  Infection: Avoid procedures during or within two weeks of an infection (including chest colds or gastrointestinal problems). Symptoms associated with infections include: Localized redness, fever, chills, night sweats or profuse sweating, burning sensation when voiding, cough, congestion, stuffiness, runny nose, sore throat, diarrhea, nausea, vomiting, cold or Flu symptoms, recent or current infections. It is specially important if the infection is over the area that we intend to treat. Heart and lung problems: Symptoms that may suggest an active cardiopulmonary problem include: cough, chest pain, breathing difficulties or shortness of breath, dizziness, ankle swelling, uncontrolled high or unusually low blood pressure, and/or palpitations. If you are experiencing any of these symptoms, cancel your procedure and contact your primary care physician for an evaluation.  Remember:  Regular Business hours are:  Monday to Thursday 8:00 AM to 4:00 PM  Provider's Schedule: Delano Metz, MD:  Procedure days: Tuesday and Thursday 7:30 AM to 4:00 PM  Edward Jolly, MD:  Procedure days: Monday and Wednesday 7:30 AM to 4:00 PM Last  Updated: 09/26/2023 ______________________________________________________________________      ______________________________________________________________________    General Risks and Possible Complications  Patient Responsibilities: It is important that you read this as it is part of your  informed consent. It is our duty to inform you of the risks and possible complications associated with  treatments offered to you. It is your responsibility as a patient to read this and to ask questions about anything that is not clear or that you believe was not covered in this document.  Patient's Rights: You have the right to refuse treatment. You also have the right to change your mind, even after initially having agreed to have the treatment done. However, under this last option, if you wait until the last second to change your mind, you may be charged for the materials used up to that point.  Introduction: Medicine is not an Visual merchandiser. Everything in Medicine, including the lack of treatment(s), carries the potential for danger, harm, or loss (which is by definition: Risk). In Medicine, a complication is a secondary problem, condition, or disease that can aggravate an already existing one. All treatments carry the risk of possible complications. The fact that a side effects or complications occurs, does not imply that the treatment was conducted incorrectly. It must be clearly understood that these can happen even when everything is done following the highest safety standards.  No treatment: You can choose not to proceed with the proposed treatment alternative. The "PRO(s)" would include: avoiding the risk of complications associated with the therapy. The "CON(s)" would include: not getting any of the treatment benefits. These benefits fall under one of three categories: diagnostic; therapeutic; and/or palliative. Diagnostic benefits include: getting information which can ultimately lead to improvement of the disease or symptom(s). Therapeutic benefits are those associated with the successful treatment of the disease. Finally, palliative benefits are those related to the decrease of the primary symptoms, without necessarily curing the condition (example: decreasing the pain from a flare-up of a  chronic condition, such as incurable terminal cancer).  General Risks and Complications: These are associated to most interventional treatments. They can occur alone, or in combination. They fall under one of the following six (6) categories: no benefit or worsening of symptoms; bleeding; infection; nerve damage; allergic reactions; and/or death. No benefits or worsening of symptoms: In Medicine there are no guarantees, only probabilities. No healthcare provider can ever guarantee that a medical treatment will work, they can only state the probability that it may. Furthermore, there is always the possibility that the condition may worsen, either directly, or indirectly, as a consequence of the treatment. Bleeding: This is more common if the patient is taking a blood thinner, either prescription or over the counter (example: Goody Powders, Fish oil, Aspirin, Garlic, etc.), or if suffering a condition associated with impaired coagulation (example: [name], cirrhosis of the liver, low platelet counts, etc.). However, even if you do not have one on these, it can still happen. If you have any of these conditions, or take one of these drugs, make sure to notify your treating physician. Infection: This is more common in patients with a compromised immune system, either due to disease (example: diabetes, cancer, human immunodeficiency virus [HIV], etc.), or due to medications or treatments (example: therapies used to treat cancer and rheumatological diseases). However, even if you do not have one on these, it can still happen. If you have any of these conditions, or take one of these drugs, make sure to notify your treating physician. Nerve Damage: This is more common when the treatment is an invasive one, but it can also happen with the use of medications, such as those used in the treatment of cancer. The damage can occur to small secondary nerves, or to large primary ones, such as those in the spinal cord  and  brain. This damage may be temporary or permanent and it may lead to impairments that can range from temporary numbness to permanent paralysis and/or brain death. Allergic Reactions: Any time a substance or material comes in contact with our body, there is the possibility of an allergic reaction. These can range from a mild skin rash (contact dermatitis) to a severe systemic reaction (anaphylactic reaction), which can result in death. Death: In general, any medical intervention can result in death, most of the time due to an unforeseen complication. ______________________________________________________________________       ______________________________________________________________________    Opioid Pain Medication Update  To: All patients taking opioid pain medications. (I.e.: hydrocodone, hydromorphone, oxycodone, oxymorphone, morphine, codeine, methadone, tapentadol, tramadol, buprenorphine, fentanyl, etc.)  Re: Updated review of side effects and adverse reactions of opioid analgesics, as well as new information about long term effects of this class of medications.  Direct risks of long-term opioid therapy are not limited to opioid addiction and overdose. Potential medical risks include serious fractures, breathing problems during sleep, hyperalgesia, immunosuppression, chronic constipation, bowel obstruction, myocardial infarction, and tooth decay secondary to xerostomia.  Unpredictable adverse effects that can occur even if you take your medication correctly: Cognitive impairment, respiratory depression, and death. Most people think that if they take their medication "correctly", and "as instructed", that they will be safe. Nothing could be farther from the truth. In reality, a significant amount of recorded deaths associated with the use of opioids has occurred in individuals that had taken the medication for a long time, and were taking their medication correctly. The following are  examples of how this can happen: Patient taking his/her medication for a long time, as instructed, without any side effects, is given a certain antibiotic or another unrelated medication, which in turn triggers a "Drug-to-drug interaction" leading to disorientation, cognitive impairment, impaired reflexes, respiratory depression or an untoward event leading to serious bodily harm or injury, including death.  Patient taking his/her medication for a long time, as instructed, without any side effects, develops an acute impairment of liver and/or kidney function. This will lead to a rapid inability of the body to breakdown and eliminate their pain medication, which will result in effects similar to an "overdose", but with the same medicine and dose that they had always taken. This again may lead to disorientation, cognitive impairment, impaired reflexes, respiratory depression or an untoward event leading to serious bodily harm or injury, including death.  A similar problem will occur with patients as they grow older and their liver and kidney function begins to decrease as part of the aging process.  Background information: Historically, the original case for using long-term opioid therapy to treat chronic noncancer pain was based on safety assumptions that subsequent experience has called into question. In 1996, the American Pain Society and the American Academy of Pain Medicine issued a consensus statement supporting long-term opioid therapy. This statement acknowledged the dangers of opioid prescribing but concluded that the risk for addiction was low; respiratory depression induced by opioids was short-lived, occurred mainly in opioid-naive patients, and was antagonized by pain; tolerance was not a common problem; and efforts to control diversion should not constrain opioid prescribing. This has now proven to be wrong. Experience regarding the risks for opioid addiction, misuse, and overdose in community  practice has failed to support these assumptions.  According to the Centers for Disease Control and Prevention, fatal overdoses involving opioid analgesics have increased sharply over the past decade. Currently, more than 96,700  people die from drug overdoses every year. Opioids are a factor in 7 out of every 10 overdose deaths. Deaths from drug overdose have surpassed motor vehicle accidents as the leading cause of death for individuals between the ages of 74 and 78.  Clinical data suggest that neuroendocrine dysfunction may be very common in both men and women, potentially causing hypogonadism, erectile dysfunction, infertility, decreased libido, osteoporosis, and depression. Recent studies linked higher opioid dose to increased opioid-related mortality. Controlled observational studies reported that long-term opioid therapy may be associated with increased risk for cardiovascular events. Subsequent meta-analysis concluded that the safety of long-term opioid therapy in elderly patients has not been proven.   Side Effects and adverse reactions: Common side effects: Drowsiness (sedation). Dizziness. Nausea and vomiting. Constipation. Physical dependence -- Dependence often manifests with withdrawal symptoms when opioids are discontinued or decreased. Tolerance -- As you take repeated doses of opioids, you require increased medication to experience the same effect of pain relief. Respiratory depression -- This can occur in healthy people, especially with higher doses. However, people with COPD, asthma or other lung conditions may be even more susceptible to fatal respiratory impairment.  Uncommon side effects: An increased sensitivity to feeling pain and extreme response to pain (hyperalgesia). Chronic use of opioids can lead to this. Delayed gastric emptying (the process by which the contents of your stomach are moved into your small intestine). Muscle rigidity. Immune system and hormonal  dysfunction. Quick, involuntary muscle jerks (myoclonus). Arrhythmia. Itchy skin (pruritus). Dry mouth (xerostomia).  Long-term side effects: Chronic constipation. Sleep-disordered breathing (SDB). Increased risk of bone fractures. Hypothalamic-pituitary-adrenal dysregulation. Increased risk of overdose.  RISKS: Respiratory depression and death: Opioids increase the risk of respiratory depression and death.  Drug-to-drug interactions: Opioids are relatively contraindicated in combination with benzodiazepines, sleep inducers, and other central nervous system depressants. Other classes of medications (i.e.: certain antibiotics and even over-the-counter medications) may also trigger or induce respiratory depression in some patients.  Medical conditions: Patients with pre-existing respiratory problems are at higher risk of respiratory failure and/or depression when in combination with opioid analgesics. Opioids are relatively contraindicated in some medical conditions such as central sleep apnea.   Fractures and Falls:  Opioids increase the risk and incidence of falls. This is of particular importance in elderly patients.  Endocrine System:  Long-term administration is associated with endocrine abnormalities (endocrinopathies). (Also known as Opioid-induced Endocrinopathy) Influences on both the hypothalamic-pituitary-adrenal axis?and the hypothalamic-pituitary-gonadal axis have been demonstrated with consequent hypogonadism and adrenal insufficiency in both sexes. Hypogonadism and decreased levels of dehydroepiandrosterone sulfate have been reported in men and women. Endocrine effects include: Amenorrhoea in women (abnormal absence of menstruation) Reduced libido in both sexes Decreased sexual function Erectile dysfunction in men Hypogonadisms (decreased testicular function with shrinkage of testicles) Infertility Depression and fatigue Loss of muscle mass Anxiety Depression Immune  suppression Hyperalgesia Weight gain Anemia Osteoporosis Patients (particularly women of childbearing age) should avoid opioids. There is insufficient evidence to recommend routine monitoring of asymptomatic patients taking opioids in the long-term for hormonal deficiencies.  Immune System: Human studies have demonstrated that opioids have an immunomodulating effect. These effects are mediated via opioid receptors both on immune effector cells and in the central nervous system. Opioids have been demonstrated to have adverse effects on antimicrobial response and anti-tumour surveillance. Buprenorphine has been demonstrated to have no impact on immune function.  Opioid Induced Hyperalgesia: Human studies have demonstrated that prolonged use of opioids can lead to a state of abnormal pain sensitivity, sometimes  called opioid induced hyperalgesia (OIH). Opioid induced hyperalgesia is not usually seen in the absence of tolerance to opioid analgesia. Clinically, hyperalgesia may be diagnosed if the patient on long-term opioid therapy presents with increased pain. This might be qualitatively and anatomically distinct from pain related to disease progression or to breakthrough pain resulting from development of opioid tolerance. Pain associated with hyperalgesia tends to be more diffuse than the pre-existing pain and less defined in quality. Management of opioid induced hyperalgesia requires opioid dose reduction.  Cancer: Chronic opioid therapy has been associated with an increased risk of cancer among noncancer patients with chronic pain. This association was more evident in chronic strong opioid users. Chronic opioid consumption causes significant pathological changes in the small intestine and colon. Epidemiological studies have found that there is a link between opium dependence and initiation of gastrointestinal cancers. Cancer is the second leading cause of death after cardiovascular disease.  Chronic use of opioids can cause multiple conditions such as GERD, immunosuppression and renal damage as well as carcinogenic effects, which are associated with the incidence of cancers.   Mortality: Long-term opioid use has been associated with increased mortality among patients with chronic non-cancer pain (CNCP).  Prescription of long-acting opioids for chronic noncancer pain was associated with a significantly increased risk of all-cause mortality, including deaths from causes other than overdose.  Reference: Von Korff M, Kolodny A, Deyo RA, Chou R. Long-term opioid therapy reconsidered. Ann Intern Med. 2011 Sep 6;155(5):325-8. doi: 10.7326/[id number]. PMID: 16109604; PMCID: [id number]. Randon Goldsmith, Hayward RA, Dunn KM, [place] KP. Risk of adverse events in patients prescribed long-term opioids: A cohort study in the [place] [facility] [employer]. Eur J Pain. 2019 May;23(5):908-922. doi: 10.1002/ejp.1357. Epub 2019 Jan 31. PMID: 78295621. Colameco S, Coren JS, Ciervo CA. Continuous opioid treatment for chronic noncancer pain: a time for moderation in prescribing. Postgrad Med. 2009 Jul;121(4):61-6. doi: 10.3810/pgm.2009.07.2032. PMID: 30865784. William Hamburger RN, Colton SD, Blazina I, Cristopher Peru, Bougatsos C, Deyo RA. The effectiveness and risks of long-term opioid therapy for chronic pain: a systematic review for a [employer] to [employer]. Ann Intern Med. 2015 Feb 17;162(4):276-86. doi: 10.7326/M14-2559. PMID: 69629528. Caryl Bis [facility], Makuc DM. NCHS Data Brief No. 22. [place]: Centers for Disease Control and Prevention; 2009. Sep, Increase in Fatal Poisonings Involving Opioid Analgesics in the [place], 1999-2006. Song IA, Choi HR, Oh TK. Long-term opioid use and mortality in patients with chronic non-cancer pain: Ten-year follow-up study in [place] from 2010 through 2019.  EClinicalMedicine. 2022 Jul 18;51:101558. doi: 10.1016/j.eclinm.2022.413244. PMID: 01027253; PMCID: [id number]. Huser, W., Schubert, T., Vogelmann, T. et al. All-cause mortality in patients with long-term opioid therapy compared with non-opioid analgesics for chronic non-cancer pain: a database study. BMC Med 18, 162 (2020). http://lester.info/ Rashidian H, Karie Kirks, Malekzadeh R, Haghdoost AA. An Ecological Study of the Association between Opiate Use and Incidence of Cancers. Addict Health. 2016 Fall;8(4):252-260. PMID: 59563875; PMCID: [id number].  Our Goal: Our goal is to control your pain with means other than the use of opioid pain medications.  Our Recommendation: Talk to your physician about coming off of these medications. We can assist you with the tapering down and stopping these medicines. Based on the new information, even if you cannot completely stop the medication, a decrease in the dose may be associated with a lesser risk. Ask for other means of controlling the pain. Decrease or eliminate those factors that significantly  contribute to your pain such as smoking, obesity, and a diet heavily tilted towards "inflammatory" nutrients.  Last Updated: 04/24/2023   ______________________________________________________________________       ______________________________________________________________________    National Pain Medication Shortage  The [place] is experiencing worsening drug shortages. These have had a negative widespread effect on patient care and treatment. Not expected to improve any time soon. Predicted to last past 2029.   Drug shortage list (generic names) Oxycodone IR Oxycodone/APAP Oxymorphone IR Hydromorphone Hydrocodone/APAP Morphine  Where is the problem?  Manufacturing and supply level.  Will this shortage affect you?  Only if you take any of the above pain medications.  How? You may be unable to fill your  prescription.  Your pharmacist may offer a "partial fill" of your prescription. (Warning: Do not accept partial fills.) Prescriptions partially filled cannot be transferred to another pharmacy. Read our Medication Rules and Regulation. Depending on how much medicine you are dependent on, you may experience withdrawals when unable to get the medication.  Recommendations: Consider ending your dependence on opioid pain medications. Ask your pain specialist to assist you with the process. Consider switching to a medication currently not in shortage, such as Buprenorphine. Talk to your pain specialist about this option. Consider decreasing your pain medication requirements by managing tolerance thru "Drug Holidays". This may help minimize withdrawals, should you run out of medicine. Control your pain thru the use of non-pharmacological interventional therapies.   Your prescriber: Prescribers cannot be blamed for shortages. Medication manufacturing and supply issues cannot be fixed by the prescriber.   NOTE: The prescriber is not responsible for supplying the medication, or solving supply issues. Work with your pharmacist to solve it. The patient is responsible for the decision to take or continue taking the medication and for identifying and securing a legal supply source. By law, supplying the medication is the job and responsibility of the pharmacy. The prescriber is responsible for the evaluation, monitoring, and prescribing of these medications.   Prescribers will NOT: Re-issue prescriptions that have been partially filled. Re-issue prescriptions already sent to a pharmacy.  Re-send prescriptions to a different pharmacy because yours did not have your medication. Ask pharmacist to order more medicine or transfer the prescription to another pharmacy. (Read below.)  New 2023 regulation: "June 17, 2022 Revised Regulation Allows DEA-Registered Pharmacies to Transfer Electronic Prescriptions at  a Patient's Request DEA Headquarters Division - Public Information Office Patients now have the ability to request their electronic prescription be transferred to another pharmacy without having to go back to their practitioner to initiate the request. This revised regulation went into effect on Monday, June 13, 2022.     At a patient's request, a DEA-registered retail pharmacy can now transfer an electronic prescription for a controlled substance (schedules II-V) to another DEA-registered retail pharmacy. Prior to this change, patients would have to go through their practitioner to cancel their prescription and have it re-issued to a different pharmacy. The process was taxing and time consuming for both patients and practitioners.    The Drug Enforcement Administration [facility]) published its intent to revise the process for transferring electronic prescriptions on September 04, 2020.  The final rule was published in the federal register on May 12, 2022 and went into effect 30 days later.  Under the final rule, a prescription can only be transferred once between pharmacies, and only if allowed under existing state or other applicable law. The prescription must remain in its electronic form; may not be altered in  any way; and the transfer must be communicated directly between two licensed pharmacists. It's important to note, any authorized refills transfer with the original prescription, which means the entire prescription will be filled at the same pharmacy".  Reference: [email or web address] [facility] website announcement)  [email or web address] [employer] of [employer])   [employer] / Vol. 88, No. 143 / Thursday, May 12, 2022 / Rules and Regulations DEPARTMENT OF JUSTICE  Drug Enforcement Administration  21 CFR Part 1306  [Docket No. [id number]]   RIN [id number] Transfer of Electronic Prescriptions for Schedules II-V Controlled Substances Between Pharmacies for Initial Filling  ______________________________________________________________________       ______________________________________________________________________    Transfer of Pain Medication between Pharmacies  Re: 2023 DEA Clarification on existing regulation  Published on DEA Website: June 17, 2022  Title: Revised Regulation Allows DEA-Registered Pharmacies to Electrical engineer Prescriptions at a Patient's Request DEA Headquarters Division - [employer]  "Patients now have the ability to request their electronic prescription be transferred to another pharmacy without having to go back to their practitioner to initiate the request. This revised regulation went into effect on Monday, June 13, 2022.     At a patient's request, a DEA-registered retail pharmacy can now transfer an electronic prescription for a controlled substance (schedules II-V) to another DEA-registered retail pharmacy. Prior to this change, patients would have to go through their practitioner to cancel their prescription and have it re-issued to a different pharmacy. The process was taxing and time consuming for both patients and practitioners.    The Drug Enforcement Administration [facility]) published its intent to revise the process for transferring electronic prescriptions on September 04, 2020.  The final rule was published in the federal register on May 12, 2022 and went into effect 30 days later.  Under the final rule, a prescription can only be transferred once between pharmacies, and only if allowed under existing state or other applicable law. The prescription must remain in its electronic form; may not be altered in any way; and the transfer must be communicated directly between two licensed pharmacists. It's important to note, any authorized refills transfer with the original  prescription, which means the entire prescription will be filled at the same pharmacy."    REFERENCES: 1. DEA website announcement [email or web address]  2. Department of Justice website  [email or web address]  3. DEPARTMENT OF JUSTICE Drug Enforcement Administration 21 CFR Part 1306 [Docket No. [id number]] RIN [id number] "Transfer of Electronic Prescriptions for Schedules II-V Controlled Substances Between Pharmacies for Initial Filling"  ______________________________________________________________________       ______________________________________________________________________    Medication Rules  Purpose: To inform patients, and their family members, of our medication rules and regulations.  Applies to: All patients receiving prescriptions from our practice (written or electronic).  Pharmacy of record: This is the pharmacy where your electronic prescriptions will be sent. Make sure we have the correct one.  Electronic prescriptions: In compliance with the [facility] Strengthen Opioid Misuse Prevention (STOP) Act of 2017 (Session Conni Elliot [phone]), effective October 17, 2018, all controlled substances must be electronically prescribed. Written prescriptions, faxing, or calling prescriptions to a pharmacy will no longer be done.  Prescription refills: These will be provided only during in-person appointments. No medications will be renewed without a "face-to-face" evaluation with your provider. Applies to all prescriptions.  NOTE: The following applies primarily to controlled substances (Opioid* Pain Medications).   Type of encounter (visit): For patients receiving controlled substances, face-to-face visits are required. (Not an option and not up  to the patient.)  Patient's Responsibilities: Pain Pills: Bring all pain pills to every  appointment (except for procedure appointments). Pill counts are required.  Pill Bottles: Bring pills in original pharmacy bottle. Bring bottle, even if empty. Always bring the bottle of the most recent fill.  Medication refills: You are responsible for knowing and keeping track of what medications you are taking and when is it that you will need a refill. The day before your appointment: write a list of all prescriptions that need to be refilled. The day of the appointment: give the list to the admitting nurse. Prescriptions will be written only during appointments. No prescriptions will be written on procedure days. If you forget a medication: it will not be "Called in", "Faxed", or "electronically sent". You will need to get another appointment to get these prescribed. No early refills. Do not call asking to have your prescription filled early. Partial  or short prescriptions: Occasionally your pharmacy may not have enough pills to fill your prescription.  NEVER ACCEPT a partial fill or a prescription that is short of the total amount of pills that you were prescribed.  With controlled substances the law allows 72 hours for the pharmacy to complete the prescription.  If the prescription is not completed within 72 hours, the pharmacist will require a new prescription to be written. This means that you will be short on your medicine and we WILL NOT send another prescription to complete your original prescription.  Instead, request the pharmacy to send a carrier to a nearby branch to get enough medication to provide you with your full prescription. Prescription Accuracy: You are responsible for carefully inspecting your prescriptions before leaving our office. Have the discharge nurse carefully go over each prescription with you, before taking them home. Make sure that your name is accurately spelled, that your address is correct. Check the name and dose of your medication to make sure it is accurate. Check  the number of pills, and the written instructions to make sure they are clear and accurate. Make sure that you are given enough medication to last until your next medication refill appointment. Taking Medication: Take medication as prescribed. When it comes to controlled substances, taking less pills or less frequently than prescribed is permitted and encouraged. Never take more pills than instructed. Never take the medication more frequently than prescribed.  Inform other Doctors: Always inform, all of your healthcare providers, of all the medications you take. Pain Medication from other Providers: You are not allowed to accept any additional pain medication from any other Doctor or Healthcare provider. There are two exceptions to this rule. (see below) In the event that you require additional pain medication, you are responsible for notifying us, as stated below. Cough Medicine: Often these contain an opioid, such as codeine or hydrocodone. Never accept or take cough medicine containing these opioids if you are already taking an opioid* medication. The combination may cause respiratory failure and death. Medication Agreement: You are responsible for carefully reading and following our Medication Agreement. This must be signed before receiving any prescriptions from our practice. Safely store a copy of your signed Agreement. Violations to the Agreement will result in no further prescriptions. (Additional copies of our Medication Agreement are available upon request.) Laws, Rules, & Regulations: All patients are expected to follow all [address] and [employer], Rules, [employer]. Ignorance of the Laws does not constitute a valid excuse.  Illegal drugs and Controlled Substances: The use of illegal substances (  including, but not limited to marijuana and its derivatives) and/or the illegal use of any controlled substances is strictly prohibited. Violation of this rule may result in the immediate and  permanent discontinuation of any and all prescriptions being written by our practice. The use of any illegal substances is prohibited. Adopted CDC guidelines & recommendations: Target dosing levels will be at or below 60 MME/day. Use of benzodiazepines** is not recommended. Urine Drug testing: Patients taking controlled substances will be required to provide a urine sample upon request. Do not void before coming to your medication management appointments. Hold emptying your bladder until you are admitted. The admitting nurse will inform you if a sample is required. Our practice reserves the right to call you at any time to provide a sample. Once receiving the call, you have 24 hours to comply with request. Not providing a sample upon request may result in termination of medication therapy.  Exceptions: There are only two exceptions to the rule of not receiving pain medications from other Healthcare Providers. Exception #1 (Emergencies): In the event of an emergency (i.e.: accident requiring emergency care), you are allowed to receive additional pain medication. However, you are responsible for: As soon as you are able, call our office [phone], at any time of the day or night, and leave a message stating your name, the date and nature of the emergency, and the name and dose of the medication prescribed. In the event that your call is answered by a member of our staff, make sure to document and save the date, time, and the name of the person that took your information.  Exception #2 (Planned Surgery): In the event that you are scheduled by another doctor or dentist to have any type of surgery or procedure, you are allowed (for a period no longer than 30 days), to receive additional pain medication, for the acute post-op pain. However, in this case, you are responsible for picking up a copy of our "Post-op Pain Management for Surgeons" handout, and giving it to your surgeon or dentist. This document is  available at our office, and does not require an appointment to obtain it. Simply go to our office during business hours (Monday-Thursday from 8:00 AM to 4:00 PM) (Friday 8:00 AM to 12:00 Noon) or if you have a scheduled appointment with [place], prior to your surgery, and ask for it by name. In addition, you are responsible for: calling our office [phone], at any time of the day or night, and leaving a message stating your name, name of your surgeon, type of surgery, and date of procedure or surgery. Failure to comply with your responsibilities may result in termination of therapy involving the controlled substances.  Consequences:  Non-compliance with the above rules may result in permanent discontinuation of medication prescription therapy. All patients receiving any type of controlled substance is expected to comply with the above patient responsibilities. Not doing so may result in permanent discontinuation of medication prescription therapy. Medication Agreement Violation. Following the above rules, including your responsibilities will help you in avoiding a Medication Agreement Violation ("Breaking your Pain Medication Contract").  *Opioid medications include: morphine, codeine, oxycodone, oxymorphone, hydrocodone, hydromorphone, meperidine, tramadol, tapentadol, buprenorphine, fentanyl, methadone. **Benzodiazepine medications include: diazepam (Valium), alprazolam (Xanax), clonazepam (Klonopine), lorazepam (Ativan), clorazepate (Tranxene), chlordiazepoxide (Librium), estazolam (Prosom), oxazepam (Serax), temazepam (Restoril), triazolam (Halcion) (Last updated: 08/09/2023) ______________________________________________________________________      ______________________________________________________________________    Medication Recommendations and Reminders  Applies to: All patients receiving prescriptions (written and/or

## 2024-01-23 NOTE — Progress Notes (Unsigned)
 PROVIDER NOTE: Interpretation of information contained herein should be left to medically-trained personnel. Specific patient instructions are provided elsewhere under "Patient Instructions" section of medical record. This document was created in part using AI and STT-dictation technology, any transcriptional errors that may result from this process are unintentional.  Patient: Theresa [name]  Service: E/M   PCP: Leanord Asal, Nelva Bush, MD  DOB: [birth year]  DOS: 01/24/2024  Provider: Oswaldo Done, MD  MRN: [id number]  Delivery: Face-to-face  Specialty: Interventional Pain Management  Type: Established Patient  Setting: Ambulatory outpatient facility  Specialty designation: 09  Referring Prov.: Leanord Asal, Cy Blamer *  Location: Outpatient office facility       HPI  [name], a 68 y.o. year old female, is here today because of her Chronic pain syndrome [G89.4]. [name] primary complain today is No chief complaint on file.  Pertinent problems: [name] has Neuritis or radiculitis due to rupture of lumbar intervertebral disc; DDD (degenerative disc disease), lumbar; Carpal tunnel syndrome; Neurogenic pain; Neuropathic pain; Musculoskeletal pain; Lumbar spondylosis (L4-5); Chronic low back pain (1ry area of Pain) (Bilateral) (R>L) w/o sciatica; Chronic lower extremity pain (2ry area of Pain) (Left); Chronic lumbar radicular pain (L4 & S1 Dermatome) (Left); Chronic shoulder pain (Right side); Chronic upper extremity pain (Right-sided); Cervical radiculitis (Right side); Abnormal x-ray of lumbar spine; Chronic pain syndrome (significant psychosocial component); 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; 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); Lumbar facet syndrome (Bilateral) (R>L); Chronic sacroiliac joint pain (Bilateral); Lumbar facet hypertrophy (L3-4, L5-S1) (Bilateral); Lumbar spinal stenosis (L4-5); 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); Lumbar disc  herniation with 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); Lumbosacral radiculopathy at L4; Chronic shoulder pain (Left); Arthralgia of acromioclavicular joint (Right); Acromioclavicular joint DJD (Right); Osteoarthritis of shoulder (Right); Chronic hip pain (3ry area of Pain) (Bilateral) (L>R); Chronic lower extremity pain (Bilateral) (R>L); Cervicalgia (Bilateral) (R>L); Spondylosis without myelopathy or radiculopathy, lumbosacral region; Chronic knee pain (Left); Chronic left-sided lumbar radiculopathy; Abnormal MRI, lumbar spine (12/09/2021); Lumbar disc extrusion (L2-3) (Left); Chronic shoulder pain (Bilateral); Abnormal MRI, cervical spine; Trigger point with back pain; Lumbar trigger point syndrome; Lumbar facet joint pain; Grade 1 Anterolisthesis of lumbar spine (4mm) (L3/L4); Grade 1 Retrolisthesis (2mm) (L5/S1); Chronic hip pain (Right); Chronic groin pain (Right); Buttock pain (Right); and Greater trochanteric bursitis (Right) on their pertinent problem list. Pain Assessment: Severity of   is reported as a  /10. Location:    / . Onset:  . Quality:  . Timing:  . Modifying factor(s):  Marland Kitchen Vitals:  vitals were not taken for this visit.  BMI: Estimated body mass index is 36.21 kg/m as calculated from the following:   Height as of 10/25/23: 5\' 2"  (1.575 m).   Weight as of 10/25/23: 198 lb (89.8 kg). Last encounter: 10/25/2023. Last procedure: 06/06/2023.  Reason for encounter: medication management. ***  Discussed the use of AI scribe software for clinical note transcription with the patient, who gave verbal consent to proceed.  History of Present Illness         RTCB: 04/25/2024   Pharmacotherapy Assessment  Analgesic: Hydrocodone/APAP 5/325 one tablet every 8 hours (15 mg/day of hydrocodone) MME/day: 15 mg/day.   Monitoring:  PMP: PDMP reviewed during this encounter.       Pharmacotherapy: No side-effects or adverse reactions reported. Compliance: No problems identified. Effectiveness: Clinically  acceptable.  No notes on file  No results found for: "CBDTHCR" No results found for: "D8THCCBX" No results found for: "D9THCCBX"  UDS:  Summary  Date Value Ref Range Status  10/25/2023 FINAL  Final    Comment:    ==================================================================== [employer] [facility] 13 (MW) ==================================================================== Test                             Result       Flag       Units  Drug Present and Declared for Prescription Verification   Hydrocodone                    1227         EXPECTED   ng/mg creat   Hydromorphone                  103          EXPECTED   ng/mg creat   Dihydrocodeine                 146          EXPECTED   ng/mg creat   Norhydrocodone                 518          EXPECTED   ng/mg creat    Sources of hydrocodone include scheduled prescription medications.    Hydromorphone, dihydrocodeine and norhydrocodone are expected    metabolites of hydrocodone. Hydromorphone and dihydrocodeine are    also available as scheduled prescription medications.  ==================================================================== Test                      Result    Flag   Units      Ref Range   Creatinine              67               mg/dL      >=01 ==================================================================== Declared Medications:  The flagging and interpretation on this report are based on the  following declared medications.  Unexpected results may arise from  inaccuracies in the declared medications.   **Note: The testing scope of this panel includes these medications:   Hydrocodone (Norco)   **Note: The testing scope of this panel does not include the  following reported medications:   Acetaminophen (Tylenol)  Acetaminophen (Norco)  Albuterol (Ventolin HFA)  Albuterol (Duoneb)  Amiodarone (Pacerone)  Buspirone (Buspar)  Cholecalciferol  Dapagliflozin (Farxiga)  Diltiazem (Cardizem)  Fluticasone   Furosemide  Ipratropium (Duoneb)  Melatonin  Naloxone (Narcan)  Omeprazole (Prilosec)  Polyethylene Glycol (MiraLAX)  Potassium (Klor-Con)  Roflumilast (Daliresp)  Semaglutide [facility])  Sertraline (Zoloft)  Spironolactone (Aldactone)  Topical Diclofenac (Voltaren)  Torsemide (Demadex)  Umeclidinium  Vilanterol ==================================================================== For clinical consultation, please call [phone]. ====================================================================       ROS  Constitutional: Denies any fever or chills Gastrointestinal: No reported hemesis, hematochezia, vomiting, or acute GI distress Musculoskeletal: Denies any acute onset joint swelling, redness, loss of ROM, or weakness Neurological: No reported episodes of acute onset apraxia, aphasia, dysarthria, agnosia, amnesia, paralysis, loss of coordination, or loss of consciousness  Medication Review  Cholecalciferol, Fluticasone-Umeclidin-Vilant, Furosemide, HYDROcodone-acetaminophen, acetaminophen, albuterol, amiodarone, dapagliflozin propanediol, diclofenac Sodium, diltiazem, ipratropium-albuterol, melatonin, naloxone, polyethylene glycol, potassium chloride SA, roflumilast, sertraline, spironolactone, tirzepatide, and torsemide  History Review  Allergy: [name] is allergic to gabapentin, codeine, and meloxicam. Drug: [name]  reports current drug use. Alcohol:  reports no history of alcohol use. Tobacco:  reports  that she has quit smoking. Her smoking use included cigarettes. She has a 66 pack-year smoking history. She has never used smokeless tobacco. Social: [name]  reports that she has quit smoking. Her smoking use included cigarettes. She has a 66 pack-year smoking history. She has never used smokeless tobacco. She reports current drug use. She reports that she does not drink alcohol. Medical:  has a past medical history of Acute postoperative pain (10/03/2017),  Anxiety, BiPAP (biphasic positive airway pressure) dependence, Carpal tunnel syndrome, CHF (congestive heart failure) (HCC), CHF (congestive heart failure) (HCC), Chronic generalized abdominal pain, Chronic rhinitis, COPD (chronic obstructive pulmonary disease) (HCC), DDD (degenerative disc disease), cervical, DDD (degenerative disc disease), lumbosacral, Depression, Dyspnea, Edema, Flu, Gastritis, GERD (gastroesophageal reflux disease), Hematuria, Hernia of abdominal wall (07/17/2018), Hernia, abdominal, Hypertension, Kidney stones, Low back pain, Lumbar radiculitis, Malodorous urine, Muscle weakness, Obesity, Oxygen dependent, Pneumonia due to COVID-19 virus (02/03/2020), Renal cyst, Sensory urge incontinence, Thyroid activity decreased, Tobacco abuse, and Wheezing. Surgical: [name]  has a past surgical history that includes Abdominal hysterectomy; Tonsillectomy; Carpal tunnel release (Left, 2012); Tubal ligation; epigastric hernia repair (N/A, 08/13/2018); Umbilical hernia repair (N/A, 08/13/2018); RIGHT/LEFT HEART CATH AND CORONARY ANGIOGRAPHY (N/A, 07/11/2019); TEE without cardioversion (N/A, 04/29/2022); Cardioversion (N/A, 04/29/2022); Esophagogastroduodenoscopy (N/A, 06/21/2022); and cataract surgery (Bilateral). Family: family history includes Alcohol abuse in her father; Breast cancer in her sister; Cancer in her brother, brother, and sister; Coronary artery disease in her mother; Heart disease in her father and mother; Kidney cancer in her brother; Lung cancer in her sister; Pneumonia in her brother; Stroke in her mother.  Laboratory Chemistry Profile   Renal Lab Results  Component Value Date   BUN 14 08/29/2023   CREATININE 0.87 08/29/2023   BCR 20 06/05/2023   GFRAA >60 06/26/2020   GFRNONAA >60 08/29/2023    Hepatic Lab Results  Component Value Date   AST 23 11/05/2022   ALT 18 11/05/2022   ALBUMIN 3.5 11/05/2022   ALKPHOS 82 11/05/2022   LIPASE 57 (H) 06/19/2022   AMMONIA 40  (H) 10/11/2019    Electrolytes Lab Results  Component Value Date   NA 139 08/29/2023   K 3.5 08/29/2023   CL 107 08/29/2023   CALCIUM 8.7 (L) 08/29/2023   MG 2.8 (H) 11/08/2022   PHOS 3.2 06/21/2022    Bone Lab Results  Component Value Date   25OHVITD1 13 (L) 09/17/2015   25OHVITD2 <1.0 09/17/2015   25OHVITD3 13 09/17/2015    Inflammation (CRP: Acute Phase) (ESR: Chronic Phase) Lab Results  Component Value Date   CRP 0.9 04/02/2022   ESRSEDRATE 17 04/06/2022   LATICACIDVEN 0.9 04/01/2022         Note: Above Lab results reviewed.  Recent Imaging Review  CT CHEST LUNG CA SCREEN LOW DOSE W/O CM CLINICAL DATA:  Former smoker, quit 2024, 66 pack-year history.  EXAM: CT CHEST WITHOUT CONTRAST LOW-DOSE FOR LUNG CANCER SCREENING  TECHNIQUE: Multidetector CT imaging of the chest was performed following the standard protocol without IV contrast.  RADIATION DOSE REDUCTION: This exam was performed according to the departmental dose-optimization program which includes automated exposure control, adjustment of the mA and/or kV according to patient size and/or use of iterative reconstruction technique.  COMPARISON:  04/09/2022.  FINDINGS: Cardiovascular: Atherosclerotic calcification of the aorta with age advanced involvement of all 3 coronary arteries. Enlarged pulmonic trunk. Heart is at the upper limits of normal in size to mildly enlarged. No pericardial effusion.  Mediastinum/Nodes: No pathologically enlarged  mediastinal or axillary lymph nodes. Hilar regions are difficult to definitively evaluate without IV contrast. Esophagus is grossly unremarkable.  Lungs/Pleura: Centrilobular and paraseptal emphysema. 4.8 mm subpleural right upper lobe nodule, unchanged. Chronic right middle lobe collapse, as on 04/09/2022. No pleural fluid. Narrowed right middle lobe bronchus, as before. Airway is otherwise unremarkable.  Upper Abdomen: Probable small left hepatic lobe cysts.  No specific follow-up necessary. Visualized portions of the liver, adrenal glands, spleen and stomach are otherwise grossly unremarkable.  Musculoskeletal: Degenerative changes in the spine.  IMPRESSION: 1. Lung-RADS 2, benign appearance or behavior. Continue annual screening with low-dose chest CT without contrast in 12 months. 2. Chronic right middle lobe volume loss. 3. Age advanced three-vessel coronary artery calcification. 4.  Aortic atherosclerosis (ICD10-I70.0). 5. Enlarged pulmonic trunk, indicative of pulmonary arterial hypertension. 6.  Emphysema (ICD10-J43.9).  Electronically Signed   By: Leanna Battles M.D.   On: 10/30/2023 12:52 Note: Reviewed        Physical Exam  General appearance: Well nourished, well developed, and well hydrated. In no apparent acute distress Mental status: Alert, oriented x 3 (person, place, & time)       Respiratory: No evidence of acute respiratory distress Eyes: PERLA Vitals: There were no vitals taken for this visit. BMI: Estimated body mass index is 36.21 kg/m as calculated from the following:   Height as of 10/25/23: 5\' 2"  (1.575 m).   Weight as of 10/25/23: 198 lb (89.8 kg). Ideal: Patient weight not recorded  Assessment   Diagnosis Status  1. Chronic pain syndrome (significant psychosocial component)   2. Chronic low back pain (1ry area of Pain) (Bilateral) (L>R) w/o sciatica   3. Chronic lower extremity pain (2ry area of Pain) (Left)   4. Chronic hip pain (3ry area of Pain) (Bilateral) (L>R)   5. Lumbar facet syndrome (Bilateral) (L>R)   6. Chronic knee pain (Left)   7. Chronic lower extremity pain (Bilateral) (L>R)   8. Pharmacologic therapy   9. Chronic use of opiate for therapeutic purpose   10. Encounter for medication management   11. Encounter for chronic pain management    Controlled Controlled Controlled   Updated Problems: No problems updated.  Plan of Care  Problem-specific:  Assessment and Plan             [name] has a current medication list which includes the following long-term medication(s): albuterol, amiodarone, hydrocodone-acetaminophen, ipratropium-albuterol, potassium chloride sa, sertraline, and spironolactone.  Pharmacotherapy (Medications Ordered): No orders of the defined types were placed in this encounter.  Orders:  No orders of the defined types were placed in this encounter.  Follow-up plan:   No follow-ups on file.     Interventional Therapies  Risk Factors  Considerations  Medical Comorbidities:  COPD  Emphysema  Chronic Smoker  O2 dependent  SOB  CHF  Hx. of A-Fib (Cardioverted)  Obesity  CAD   NOTE: NO further Lumbar RFA until BMI <35. Patient taken off of Xarelto after cardioversion.   Planned  Pending:   Diagnostic/therapeutic right IA hip and trochanteric bursa inj. #1    Under consideration:   Diagnostic bilateral cervical facet MBB #1  Palliative left L2-3 LESI #4  NOTE: NO further Lumbar RFA until BMI <35.   Completed:   Palliative right lumbar facet MBB x7 (09/20/2022) (100/100/100x5days/0)  Palliative left lumbar facet MBB x6 (11/11/2021) (100/100/0/0)  Therapeutic left L2-3 LESI x4 (05/09/2023) (8-0/10) (100/100/50/>50)  Diagnostic/palliative left L4 TFESI x1 (12/26/2017) (100/100/50/>50)  Diagnostic/palliative  left L4-5 LESI x2 (07/30/2019) (100/100/0/0)  Diagnostic/palliative right L4 TFESI x1 (01/25/2018) (100/100/50/50)  Diagnostic/palliative right L4-5 LESI x1 (01/25/2018) (100/100/50/50)  Palliative left lumbar facet RFA x1 (06/27/2017) (0/0/100/>50)  Palliative right lumbar facet RFA x1 (10/03/2017) (100/100/75)    Therapeutic  Palliative (PRN) options:   Evaluation needed before each determination.   Completed by other providers:      Pharmacotherapy  Nonopioids transferred 08/03/2020: Gabapentin and baclofen Note: Patient taken off of Xarelto after cardioversion.     Recent Visits Date Type Provider  Dept  10/25/23 Office Visit Delano Metz, MD [facility]  Showing recent visits within past 90 days and meeting all other requirements Future Appointments Date Type Provider Dept  01/24/24 Appointment Delano Metz, MD [facility]  Showing future appointments within next 90 days and meeting all other requirements  I discussed the assessment and treatment plan with the patient. The patient was provided an opportunity to ask questions and all were answered. The patient agreed with the plan and demonstrated an understanding of the instructions.  Patient advised to call back or seek an in-person evaluation if the symptoms or condition worsens.  Duration of encounter: *** minutes.  Total time on encounter, as per AMA guidelines included both the face-to-face and non-face-to-face time personally spent by the physician and/or other qualified health care professional(s) on the day of the encounter (includes time in activities that require the physician or other qualified health care professional and does not include time in activities normally performed by clinical staff). Physician's time may include the following activities when performed: Preparing to see the patient (e.g., pre-charting review of records, searching for previously ordered imaging, lab work, and nerve conduction tests) Review of prior analgesic pharmacotherapies. Reviewing PMP Interpreting ordered tests (e.g., lab work, imaging, nerve conduction tests) Performing post-procedure evaluations, including interpretation of diagnostic procedures Obtaining and/or reviewing separately obtained history Performing a medically appropriate examination and/or evaluation Counseling and educating the patient/family/caregiver Ordering medications, tests, or procedures Referring and communicating with other health care professionals (when not separately reported) Documenting clinical information in the electronic or  other health record Independently interpreting results (not separately reported) and communicating results to the patient/ family/caregiver Care coordination (not separately reported)  Note by: Oswaldo Done, MD (TTS and AI technology used. I apologize for any typographical errors that were not detected and corrected.) Date: 01/24/2024; Time: 8:58 AM

## 2024-01-24 ENCOUNTER — Ambulatory Visit: Payer: Medicare HMO | Attending: Pain Medicine | Admitting: Pain Medicine

## 2024-01-24 ENCOUNTER — Encounter: Payer: Self-pay | Admitting: Pain Medicine

## 2024-01-24 VITALS — BP 111/70 | HR 109 | Temp 97.7°F | Resp 20 | Ht 62.0 in | Wt 195.0 lb

## 2024-01-24 DIAGNOSIS — G894 Chronic pain syndrome: Secondary | ICD-10-CM | POA: Diagnosis present

## 2024-01-24 DIAGNOSIS — M25551 Pain in right hip: Secondary | ICD-10-CM | POA: Insufficient documentation

## 2024-01-24 DIAGNOSIS — M79604 Pain in right leg: Secondary | ICD-10-CM | POA: Insufficient documentation

## 2024-01-24 DIAGNOSIS — M25562 Pain in left knee: Secondary | ICD-10-CM | POA: Insufficient documentation

## 2024-01-24 DIAGNOSIS — G8929 Other chronic pain: Secondary | ICD-10-CM | POA: Insufficient documentation

## 2024-01-24 DIAGNOSIS — Z79899 Other long term (current) drug therapy: Secondary | ICD-10-CM | POA: Diagnosis present

## 2024-01-24 DIAGNOSIS — M545 Low back pain, unspecified: Secondary | ICD-10-CM | POA: Diagnosis present

## 2024-01-24 DIAGNOSIS — Z79891 Long term (current) use of opiate analgesic: Secondary | ICD-10-CM | POA: Diagnosis present

## 2024-01-24 DIAGNOSIS — M79605 Pain in left leg: Secondary | ICD-10-CM | POA: Insufficient documentation

## 2024-01-24 DIAGNOSIS — M25552 Pain in left hip: Secondary | ICD-10-CM | POA: Diagnosis present

## 2024-01-24 DIAGNOSIS — M47816 Spondylosis without myelopathy or radiculopathy, lumbar region: Secondary | ICD-10-CM | POA: Diagnosis present

## 2024-01-24 MED ORDER — HYDROCODONE-ACETAMINOPHEN 5-325 MG PO TABS: 1.0000 | ORAL_TABLET | Freq: Three times a day (TID) | ORAL | 0 refills | Status: DC | PRN

## 2024-01-24 NOTE — Progress Notes (Unsigned)
 Nursing Pain Medication Assessment:  Safety precautions to be maintained throughout the outpatient stay will include: orient to surroundings, keep bed in low position, maintain call bell within reach at all times, provide assistance with transfer out of bed and ambulation.  Medication Inspection Compliance: Pill count conducted under aseptic conditions, in front of the patient. Neither the pills nor the bottle was removed from the patient's sight at any time. Once count was completed pills were immediately returned to the patient in their original bottle.  Medication: Hydrocodone/APAP Pill/Patch Count:  14 of 90 pills remain Pill/Patch Appearance: Markings consistent with prescribed medication Bottle Appearance: Standard pharmacy container. Clearly labeled. Filled Date: 03 / 12 / 2025 Last Medication intake:  Today

## 2024-01-30 ENCOUNTER — Ambulatory Visit: Admitting: Registered Nurse

## 2024-01-30 ENCOUNTER — Encounter
Admission: RE | Disposition: A | Discharge status: Home / Self Care | Payer: Self-pay | Source: Ambulatory Visit | Attending: Internal Medicine

## 2024-01-30 ENCOUNTER — Ambulatory Visit
Admission: RE | Admit: 2024-01-30 | Discharge: 2024-01-30 | Disposition: A | Discharge status: Home / Self Care | Source: Ambulatory Visit | Attending: Internal Medicine | Admitting: Internal Medicine

## 2024-01-30 DIAGNOSIS — G8929 Other chronic pain: Secondary | ICD-10-CM

## 2024-01-30 DIAGNOSIS — I4891 Unspecified atrial fibrillation: Secondary | ICD-10-CM | POA: Diagnosis not present

## 2024-01-30 DIAGNOSIS — I4892 Unspecified atrial flutter: Secondary | ICD-10-CM | POA: Insufficient documentation

## 2024-01-30 DIAGNOSIS — I5032 Chronic diastolic (congestive) heart failure: Secondary | ICD-10-CM | POA: Diagnosis not present

## 2024-01-30 HISTORY — PX: CARDIOVERSION: SHX1299

## 2024-01-30 SURGERY — CARDIOVERSION
Anesthesia: General

## 2024-01-30 MED ORDER — PROPOFOL 10 MG/ML IV BOLUS
INTRAVENOUS | Status: AC
  Filled 2024-01-30: qty 40

## 2024-01-30 MED ORDER — SODIUM CHLORIDE 0.9 % IV SOLN: INTRAVENOUS | Status: DC

## 2024-01-30 MED ORDER — LIDOCAINE HCL (PF) 2 % IJ SOLN
INTRAMUSCULAR | Status: AC
  Filled 2024-01-30: qty 5

## 2024-01-31 ENCOUNTER — Encounter: Payer: Self-pay | Admitting: Internal Medicine

## 2024-02-06 ENCOUNTER — Telehealth: Payer: Self-pay | Admitting: Family

## 2024-02-06 NOTE — Progress Notes (Unsigned)
 Advanced [facility] Failure Clinic Note    PCP: Rory Collard, MD (last seen 07/24) Primary Cardiologist: Burney Carter, MD (last seen 07/24) HF cardiologist: Jules Oar, MD (last seen 12/23)  HPI:   Ms Theresa [name] is a 68 y/o female with a history of chronic low back pain, HTN, atrial flutter, pulmonary HTN, depression, COPD on long-term oxygen , anxiety, carpal tunnel syndrome, long-standing tobacco use and chronic heart failure.  Admitted 08/09/22 due to cough and worsening SOB due to COPD exacerbation due to RSV. Hypotension due to overdiuresis. Blood transfusion given due to hemoglobin <7 due to slow GIB. Initially need increased oxygen  at 8L but able to be weaned down to baseline 4L. US  negative for DVT. Discharged after 17 days. Admitted 11/05/22 due to epistaxis. Admitted 11/07/22 due to epistaxis and COPD exacerbation.  Echo 02/19/15: EF of 50% without valvular stenosis.  Echo 11/03/15: but unable to access results.  Echo 07/07/2019: EF of 60-65% along with mild TR and moderately elevated PA pressure. Echo 09/03/20:EF of >55% with mild LAE and severe TR.  Echo 08/08/21: EF of 60-65% along with mild LAE. Echo 04/29/22: EF of 60-65% along with mild MR and no thrombus.    Catheterization done 07/11/2019 showed:  Prox RCA to Dist RCA lesion is 50% stenosed. Mid LAD lesion is 50% stenosed. Ost LM to Dist LM lesion is 10% stenosed. Hemodynamic findings consistent with moderate pulmonary hypertension. Normal overall left ventricular function of at least 60% Moderate coronary disease no significant obstructive disease Moderate pulmonary hypertension  She presents today for a HF follow-up visit with a chief complaint of minimal shortness of breath with moderate exertion. Has associated fatigue, chronic LBP and chronic difficulty sleeping along with this. She feels like her fatigue is more related to her chronic back pain. She denies chest pain, cough, palpitations, abdominal distention,  pedal edema or dizziness. Took furoscix  a couple of weeks ago after eating [place] for dinner.   Wearing O2 on 2L at rest, 6L exertion/ bedtime. She has not smoked in the last 10 weeks.  Was doing well on wegovy  but her pharmacy has had difficulty in getting it in so she has been 2 weeks off of it now.   ROS: All systems negative except as listed in HPI, PMH and Problem List.  SH:  Social History   Socioeconomic History   Marital status: Married    Spouse name: Elana Grayer   Number of children: 2   Years of education: Not on file   Highest education level: 12th grade  Occupational History   Occupation: disability  Tobacco Use   Smoking status: Former    Current packs/day: 1.50    Average packs/day: 1.5 packs/day for 44.0 years (66.0 ttl pk-yrs)    Types: Cigarettes   Smokeless tobacco: Never   Tobacco comments:    Stopped 4 weeks ago May 20, 2023  Vaping Use   Vaping status: Never Used  Substance and Sexual Activity   Alcohol use: No   Drug use: Yes    Comment: prescribed hydrocodone    Sexual activity: Not Currently    Birth control/protection: Surgical  Other Topics Concern   Not on file  Social History Narrative   ** Merged History Encounter **       Lives with spouse    Social Drivers of Health   Financial Resource Strain: Low Risk  (07/04/2023)   Received from [facility]   Overall Financial Resource Strain (CARDIA)    Difficulty of Paying Living  Expenses: Not hard at all  Food Insecurity: No Food Insecurity (07/04/2023)   Received from [facility]   Hunger Vital Sign    Worried About Running Out of Food in the Last Year: Never true    Ran Out of Food in the Last Year: Never true  Transportation Needs: No Transportation Needs (07/04/2023)   Received from [facility] - Transportation    In the past 12 months, has lack of transportation kept you from medical appointments or from getting medications?:  No    Lack of Transportation (Non-Medical): No  Physical Activity: Inactive (08/24/2022)   Received from [facility], [facility]   Exercise Vital Sign    Days of Exercise per Week: 0 days    Minutes of Exercise per Session: 0 min  Stress: Stress Concern Present (08/24/2022)   Received from [facility], [facility]   [employer] of Occupational Health - Occupational Stress Questionnaire    Feeling of Stress : Very much  Social Connections: Socially Integrated (08/24/2022)   Received from [facility], [facility]   Social Connection and Isolation Panel [NHANES]    Frequency of Communication with Friends and Family: More than three times a week    Frequency of Social Gatherings with Friends and Family: Never    Attends Religious Services: More than 4 times per year    Active Member of Clubs or Organizations: Yes    Attends Engineer, structural: More than 4 times per year    Marital Status: Married  Catering manager Violence: Not At Risk (11/08/2022)   Humiliation, Afraid, Rape, and Kick questionnaire    Fear of Current or Ex-Partner: No    Emotionally Abused: No    Physically Abused: No    Sexually Abused: No    FH:  Family History  Problem Relation Age of Onset   Heart disease Mother    Stroke Mother    Coronary artery disease Mother    Alcohol abuse Father    Heart disease Father    Lung cancer Sister    Cancer Sister    Breast cancer Sister    Cancer Brother    Cancer Brother    Kidney cancer Brother    Pneumonia Brother    Prostate cancer Neg Hx    Bladder Cancer Neg Hx     Past Medical History:  Diagnosis Date   Acute postoperative pain 10/03/2017   Anxiety    BiPAP (biphasic positive airway pressure) dependence    at hs   Carpal tunnel syndrome    CHF (congestive heart failure) (HCC)    1/18   CHF (congestive heart failure) (HCC)     Chronic generalized abdominal pain    Chronic rhinitis    COPD (chronic obstructive pulmonary disease) (HCC)    DDD (degenerative disc disease), cervical    DDD (degenerative disc disease), lumbosacral    Depression    Dyspnea    Edema    Flu    1/18   Gastritis    GERD (gastroesophageal reflux disease)    Hematuria    Hernia of abdominal wall 07/17/2018   Hernia, abdominal    Hypertension    Kidney stones    Low back pain    Lumbar radiculitis    Malodorous urine    Muscle weakness    Obesity    Oxygen  dependent  Pneumonia due to COVID-19 virus 02/03/2020   Renal cyst    Sensory urge incontinence    Thyroid  activity decreased    9/19   Tobacco abuse    Wheezing     Current Outpatient Medications  Medication Sig Dispense Refill   acetaminophen  (TYLENOL ) 500 MG tablet Take 1,000 mg by mouth every 8 (eight) hours as needed for mild pain or headache.     albuterol  (VENTOLIN  HFA) 108 (90 Base) MCG/ACT inhaler Inhale 2 puffs into the lungs every 6 (six) hours as needed for wheezing or shortness of breath. 8 g 1   amiodarone  (PACERONE ) 200 MG tablet Take 1 tablet (200 mg total) by mouth daily. (Patient taking differently: Take 400 mg by mouth daily.) 30 tablet 0   busPIRone  (BUSPAR ) 5 MG tablet Take 5 mg by mouth 2 (two) times daily.     Cholecalciferol  50 MCG (2000 UT) CAPS Take 2,000 Units by mouth daily.     dapagliflozin  propanediol (FARXIGA ) 10 MG TABS tablet Take 1 tablet (10 mg total) by mouth daily. 90 tablet 3   diltiazem  (CARDIZEM  CD) 180 MG 24 hr capsule Take 180 mg by mouth daily.     Fluticasone -Umeclidin-Vilant (TRELEGY ELLIPTA) 100-62.5-25 MCG/ACT AEPB Inhale 1 puff into the lungs daily.     FUROSCIX  80 MG/10ML CTKT Inject 80 mg into the skin daily as needed (If gain over 3x's weight in fluid).     HYDROcodone -acetaminophen  (NORCO/VICODIN) 5-325 MG tablet Take 1 tablet by mouth every 8 (eight) hours as needed for severe pain (pain score 7-10). Must last 30 days 90  tablet 0   [START ON 02/25/2024] HYDROcodone -acetaminophen  (NORCO/VICODIN) 5-325 MG tablet Take 1 tablet by mouth every 8 (eight) hours as needed for severe pain (pain score 7-10). Must last 30 days 90 tablet 0   [START ON 03/26/2024] HYDROcodone -acetaminophen  (NORCO/VICODIN) 5-325 MG tablet Take 1 tablet by mouth every 8 (eight) hours as needed for severe pain (pain score 7-10). Must last 30 days 90 tablet 0   ipratropium-albuterol  (DUONEB) 0.5-2.5 (3) MG/3ML SOLN USE 1 VIAL VIA NEBULIZER EVERY 6 HOURS AS NEEDED FOR SHORTNESS OF BREATH OR WHEEZING 1080 mL 0   naloxone  (NARCAN ) nasal spray 4 mg/0.1 mL Place 1 spray into the nose as needed for up to 365 doses (for opioid-induced respiratory depresssion). In case of emergency (overdose), spray once into each nostril. If no response within 3 minutes, repeat application and call 911. 1 each 0   omeprazole  (PRILOSEC) 40 MG capsule Take 40 mg by mouth daily.     OXYGEN  Inhale 5 L into the lungs continuous.     potassium chloride  SA (KLOR-CON  M) 20 MEQ tablet Take 2 tablets every morning and 1 tablet every evening (Patient taking differently: Take 40-60 mEq by mouth See admin instructions. 60 meq in the morning, 40 meq in the afternoon) 270 tablet 3   Rivaroxaban  (XARELTO ) 15 MG TABS tablet Take 15 mg by mouth 2 (two) times daily with a meal.     roflumilast  (DALIRESP ) 500 MCG TABS tablet Take 500 mcg by mouth daily.     sertraline  (ZOLOFT ) 100 MG tablet Take 2 tablets (200 mg total) by mouth daily. Please schedule office visit before any future refill. 60 tablet 0   spironolactone  (ALDACTONE ) 25 MG tablet Take 0.5 tablets (12.5 mg total) by mouth daily. 45 tablet 3   tirzepatide (MOUNJARO) 10 MG/0.5ML Pen Inject 10 mg into the skin once a week. 2 mL 0   torsemide  (DEMADEX ) 20  MG tablet Take 40-80 mg by mouth See admin instructions. Take 4 tablets (80mg ) by mouth every morning and take 2 tablets (40mg ) by mouth every afternoon     No current  facility-administered medications for this visit.   There were no vitals filed for this visit.  Wt Readings from Last 3 Encounters:  01/24/24 195 lb (88.5 kg)  10/25/23 198 lb (89.8 kg)  08/08/23 214 lb (97.1 kg)   Lab Results  Component Value Date   CREATININE 0.87 08/29/2023   CREATININE 0.97 06/05/2023   CREATININE 0.71 11/08/2022   PHYSICAL EXAM:  General:  Well appearing. No resp difficulty HEENT: normal Neck: supple. JVP flat. No lymphadenopathy or thryomegaly appreciated. Cor: PMI normal. Regular rate & rhythm. No rubs, gallops or murmurs. Lungs: clear Abdomen: soft, nontender, nondistended. No hepatosplenomegaly. No bruits or masses.  Extremities: no cyanosis, clubbing, rash, edema Neuro: alert & oriented x3, cranial nerves grossly intact. Moves all 4 extremities w/o difficulty. Affect pleasant.   ECG: not done   ASSESSMENT & PLAN:  1: NICM with preserved ejection fraction- - likely due to pulmonary HTN/ OSA - NYHA class II - euvolemic today - weighing daily; reminded to call for an overnight weight gain of >2 pounds or a weekly weight gain of >5 pounds - weight stable from last visit here 2 months ago - continue farxiga  10mg  daily - continue torsemide  80mg  AM/ 40mg  PM - continue potassium 40meq BID  - continue spironolactone  12.5mg  daily  - furoscix  PRN; she says that she used it ~ 2 weeks ago after eating [place] food - wearing compression socks daily although doesn't have them on today - BNP 08/31/22 was 36.4  2: HTN- - BP 129/75 - saw PCP (Bronstein) 07/24 - BMP 06/05/23 reviewed and showed sodium 140, potassium 3.8, creatinine 0.97 and GFR 64   3: COPD- - wearing bipap nightly - oxygen  at 2L @ rest and 6L exertion/ bedtime - saw pulmonology Jamal Mays) 09/24  4: Tobacco use- - has not smoked in the last 10 weeks - congratulated her on that and encouraged her to continue abstaining  5: Atrial flutter- - previous cardioversion done - saw  cardiologist Beau Bound) 07/24 - continue diltazem CD 180mg  daily - continue amiodarone  200mg  daily - TSH 05/01/23 was 1.069 - needs yearly eye exam  6: Pulmonary HTN- - mild to moderate on cath in 2020 - cath with high output physiology. No shunts on TEE. No evidence of anomalous PV drainage or other shunts on CT - CT chest 2023 no PE. 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  - no role for selective vasodilators  - continue O2 and Bipap support   Return in 6 months, sooner if needed.       Charlette Console, FNP 02/06/24

## 2024-02-06 NOTE — Telephone Encounter (Signed)
 Called to confirm/remind patient of their appointment at the [facility] on 02/07/24.   Appointment:   [x] Confirmed  [] Left mess   [] No answer/No voice mail  [] VM Full/unable to leave message  [] Phone not in service  Patient reminded to bring all medications and/or complete list.  Confirmed patient has transportation. Gave directions, instructed to utilize valet parking.

## 2024-02-07 ENCOUNTER — Encounter: Payer: Self-pay | Admitting: Family

## 2024-02-07 ENCOUNTER — Ambulatory Visit: Payer: Medicare HMO | Attending: Family | Admitting: Family

## 2024-02-07 VITALS — BP 117/68 | HR 104 | Wt 206.0 lb

## 2024-02-07 DIAGNOSIS — Z720 Tobacco use: Secondary | ICD-10-CM

## 2024-02-07 DIAGNOSIS — I509 Heart failure, unspecified: Secondary | ICD-10-CM | POA: Insufficient documentation

## 2024-02-07 DIAGNOSIS — I10 Essential (primary) hypertension: Secondary | ICD-10-CM

## 2024-02-07 DIAGNOSIS — Z8616 Personal history of COVID-19: Secondary | ICD-10-CM | POA: Diagnosis not present

## 2024-02-07 DIAGNOSIS — Z7901 Long term (current) use of anticoagulants: Secondary | ICD-10-CM | POA: Diagnosis not present

## 2024-02-07 DIAGNOSIS — Z9981 Dependence on supplemental oxygen: Secondary | ICD-10-CM | POA: Diagnosis not present

## 2024-02-07 DIAGNOSIS — I272 Pulmonary hypertension, unspecified: Secondary | ICD-10-CM | POA: Insufficient documentation

## 2024-02-07 DIAGNOSIS — Z87891 Personal history of nicotine dependence: Secondary | ICD-10-CM | POA: Diagnosis not present

## 2024-02-07 DIAGNOSIS — I5032 Chronic diastolic (congestive) heart failure: Secondary | ICD-10-CM

## 2024-02-07 DIAGNOSIS — I4892 Unspecified atrial flutter: Secondary | ICD-10-CM | POA: Insufficient documentation

## 2024-02-07 DIAGNOSIS — Z8249 Family history of ischemic heart disease and other diseases of the circulatory system: Secondary | ICD-10-CM | POA: Insufficient documentation

## 2024-02-07 DIAGNOSIS — I428 Other cardiomyopathies: Secondary | ICD-10-CM | POA: Insufficient documentation

## 2024-02-07 DIAGNOSIS — G8929 Other chronic pain: Secondary | ICD-10-CM | POA: Insufficient documentation

## 2024-02-07 DIAGNOSIS — I110 Hypertensive heart disease with heart failure: Secondary | ICD-10-CM | POA: Insufficient documentation

## 2024-02-07 DIAGNOSIS — Z79899 Other long term (current) drug therapy: Secondary | ICD-10-CM | POA: Diagnosis not present

## 2024-02-07 DIAGNOSIS — M545 Low back pain, unspecified: Secondary | ICD-10-CM | POA: Diagnosis not present

## 2024-02-07 DIAGNOSIS — J449 Chronic obstructive pulmonary disease, unspecified: Secondary | ICD-10-CM | POA: Diagnosis not present

## 2024-02-07 NOTE — Patient Instructions (Signed)
 Follow-Up in: 6 months with Clarisa Kindred, FNP  At the [facility], you and your health needs are our priority. We have a designated team specialized in the treatment of Heart Failure. This Care Team includes your primary Heart Failure Specialized Cardiologist (physician), Advanced Practice Providers (APPs- Physician Assistants and Nurse Practitioners), and Pharmacist who all work together to provide you with the care you need, when you need it.   You may see any of the following providers on your designated Care Team at your next follow up:  Dr. Arvilla Meres Dr. Marca Ancona Dr. Dorthula Nettles Dr. Theresia Bough Clarisa Kindred, FNP Enos Fling, RPH-CPP  Please be sure to bring in all your medications bottles to every appointment.   Need to Contact us:  If you have any questions or concerns before your next appointment please send [place] a message through [place] or call our office at [phone].    TO LEAVE A MESSAGE FOR THE NURSE SELECT OPTION 2, PLEASE LEAVE A MESSAGE INCLUDING: YOUR NAME DATE OF BIRTH CALL BACK NUMBER REASON FOR CALL**this is important as we prioritize the call backs  YOU WILL RECEIVE A CALL BACK THE SAME DAY AS LONG AS YOU CALL BEFORE 4:00 PM

## 2024-02-15 DIAGNOSIS — I4891 Unspecified atrial fibrillation: Secondary | ICD-10-CM

## 2024-02-19 ENCOUNTER — Encounter: Payer: Self-pay | Admitting: Internal Medicine

## 2024-02-19 ENCOUNTER — Ambulatory Visit
Admission: RE | Admit: 2024-02-19 | Discharge: 2024-02-19 | Disposition: A | Discharge status: Home / Self Care | Attending: Internal Medicine | Admitting: Internal Medicine

## 2024-02-19 ENCOUNTER — Ambulatory Visit: Admitting: Certified Registered Nurse Anesthetist

## 2024-02-19 ENCOUNTER — Encounter
Admission: RE | Disposition: A | Discharge status: Home / Self Care | Payer: Self-pay | Source: Home / Self Care | Attending: Internal Medicine

## 2024-02-19 ENCOUNTER — Other Ambulatory Visit: Payer: Self-pay

## 2024-02-19 DIAGNOSIS — Z79899 Other long term (current) drug therapy: Secondary | ICD-10-CM | POA: Insufficient documentation

## 2024-02-19 DIAGNOSIS — Z87891 Personal history of nicotine dependence: Secondary | ICD-10-CM | POA: Insufficient documentation

## 2024-02-19 DIAGNOSIS — K219 Gastro-esophageal reflux disease without esophagitis: Secondary | ICD-10-CM | POA: Diagnosis not present

## 2024-02-19 DIAGNOSIS — I4891 Unspecified atrial fibrillation: Secondary | ICD-10-CM | POA: Diagnosis present

## 2024-02-19 DIAGNOSIS — I509 Heart failure, unspecified: Secondary | ICD-10-CM | POA: Insufficient documentation

## 2024-02-19 DIAGNOSIS — J449 Chronic obstructive pulmonary disease, unspecified: Secondary | ICD-10-CM | POA: Diagnosis not present

## 2024-02-19 DIAGNOSIS — F32A Depression, unspecified: Secondary | ICD-10-CM | POA: Insufficient documentation

## 2024-02-19 DIAGNOSIS — I4892 Unspecified atrial flutter: Secondary | ICD-10-CM | POA: Diagnosis not present

## 2024-02-19 DIAGNOSIS — E039 Hypothyroidism, unspecified: Secondary | ICD-10-CM | POA: Insufficient documentation

## 2024-02-19 DIAGNOSIS — I110 Hypertensive heart disease with heart failure: Secondary | ICD-10-CM | POA: Insufficient documentation

## 2024-02-19 DIAGNOSIS — F419 Anxiety disorder, unspecified: Secondary | ICD-10-CM | POA: Insufficient documentation

## 2024-02-19 DIAGNOSIS — Z7951 Long term (current) use of inhaled steroids: Secondary | ICD-10-CM | POA: Insufficient documentation

## 2024-02-19 DIAGNOSIS — I2510 Atherosclerotic heart disease of native coronary artery without angina pectoris: Secondary | ICD-10-CM | POA: Diagnosis not present

## 2024-02-19 HISTORY — PX: CARDIOVERSION: SHX1299

## 2024-02-19 SURGERY — CARDIOVERSION
Anesthesia: General

## 2024-02-19 MED ORDER — PROPOFOL 10 MG/ML IV BOLUS
INTRAVENOUS | Status: DC | PRN
  Administered 2024-02-19: 50 mg via INTRAVENOUS

## 2024-02-19 MED ORDER — SODIUM CHLORIDE 0.9 % IV SOLN: INTRAVENOUS | Status: DC

## 2024-02-19 MED ORDER — PROPOFOL 10 MG/ML IV BOLUS
INTRAVENOUS | Status: AC
  Filled 2024-02-19: qty 40

## 2024-02-19 NOTE — Anesthesia Preprocedure Evaluation (Signed)
 Anesthesia Evaluation  Patient identified by MRN, date of birth, ID band Patient awake    Reviewed: Allergy & Precautions, H&P , NPO status , Patient's Chart, lab work & pertinent test results, reviewed documented beta blocker date and time   Airway Mallampati: II   Neck ROM: full    Dental  (+) Poor Dentition   Pulmonary shortness of breath and with exertion, sleep apnea and Continuous Positive Airway Pressure Ventilation , pneumonia, resolved, COPD,  COPD inhaler, former smoker   Pulmonary exam normal        Cardiovascular Exercise Tolerance: Poor hypertension, On Medications + CAD and +CHF  Atrial Fibrillation  Rhythm:regular Rate:Normal     Neuro/Psych  PSYCHIATRIC DISORDERS Anxiety Depression     Neuromuscular disease    GI/Hepatic Neg liver ROS, PUD,GERD  Medicated,,  Endo/Other  Hypothyroidism    Renal/GU Renal disease  negative genitourinary   Musculoskeletal   Abdominal   Peds  Hematology  (+) Blood dyscrasia, anemia   Anesthesia Other Findings Past Medical History: 10/03/2017: Acute postoperative pain No date: Anxiety No date: BiPAP (biphasic positive airway pressure) dependence     Comment:  at hs No date: Carpal tunnel syndrome No date: CHF (congestive heart failure) (HCC)     Comment:  1/18 No date: CHF (congestive heart failure) (HCC) No date: Chronic generalized abdominal pain No date: Chronic rhinitis No date: COPD (chronic obstructive pulmonary disease) (HCC) No date: DDD (degenerative disc disease), cervical No date: DDD (degenerative disc disease), lumbosacral No date: Depression No date: Dyspnea No date: Edema No date: Flu     Comment:  1/18 No date: Gastritis No date: GERD (gastroesophageal reflux disease) No date: Hematuria 07/17/2018: Hernia of abdominal wall No date: Hernia, abdominal No date: Hypertension No date: Kidney stones No date: Low back pain No date: Lumbar  radiculitis No date: Malodorous urine No date: Muscle weakness No date: Obesity No date: Oxygen  dependent 02/03/2020: Pneumonia due to COVID-19 virus No date: Renal cyst No date: Sensory urge incontinence No date: Thyroid  activity decreased     Comment:  9/19 No date: Tobacco abuse No date: Wheezing Past Surgical History: No date: ABDOMINAL HYSTERECTOMY 04/29/2022: CARDIOVERSION; N/A     Comment:  Procedure: CARDIOVERSION;  Surgeon: Michelle Aid,               MD;  Location: [facility] ORS;  Service: Cardiovascular;                Laterality: N/A; 01/30/2024: CARDIOVERSION; N/A     Comment:  Procedure: CARDIOVERSION;  Surgeon: Antonette Batters,               MD;  Location: [facility] ORS;  Service: Cardiovascular;                Laterality: N/A; 2012: CARPAL TUNNEL RELEASE; Left No date: cataract surgery; Bilateral 08/13/2018: EPIGASTRIC HERNIA REPAIR; N/A     Comment:  HERNIA REPAIR EPIGASTRIC ADULT; polypropylene mesh               reinforcement.  Surgeon: Marshall Skeeter, MD;                Location: [facility] ORS;  Service: General;  Laterality: N/A; 06/21/2022: ESOPHAGOGASTRODUODENOSCOPY; N/A     Comment:  Procedure: ESOPHAGOGASTRODUODENOSCOPY (EGD);  Surgeon:               Toledo, Alphonsus Jeans, MD;  Location: [facility] ENDOSCOPY;  Service: Gastroenterology;  Laterality: N/A; 07/11/2019: RIGHT/LEFT HEART CATH AND CORONARY ANGIOGRAPHY; N/A     Comment:  Procedure: RIGHT/LEFT HEART CATH AND CORONARY               ANGIOGRAPHY;  Surgeon: Antonette Batters, MD;  Location:              [facility] INVASIVE CV LAB;  Service: Cardiovascular;                Laterality: N/A; 04/29/2022: TEE WITHOUT CARDIOVERSION; N/A     Comment:  Procedure: TRANSESOPHAGEAL ECHOCARDIOGRAM (TEE);                Surgeon: Michelle Aid, MD;  Location: [facility] ORS;                Service: Cardiovascular;  Laterality: N/A; No date: TONSILLECTOMY No date: TUBAL LIGATION 08/13/2018: UMBILICAL HERNIA REPAIR;  N/A     Comment:  HERNIA REPAIR UMBILICAL ADULT; polypropylene mesh               reinforcement Surgeon: Marshall Skeeter, MD;  Location:              [facility] ORS;  Service: General;  Laterality: N/A; BMI    Body Mass Index: 36.95 kg/m     Reproductive/Obstetrics negative OB ROS                             Anesthesia Physical Anesthesia Plan  ASA: 4  Anesthesia Plan: General   Post-op Pain Management:    Induction:   PONV Risk Score and Plan:   Airway Management Planned:   Additional Equipment:   Intra-op Plan:   Post-operative Plan:   Informed Consent: I have reviewed the patients History and Physical, chart, labs and discussed the procedure including the risks, benefits and alternatives for the proposed anesthesia with the patient or authorized representative who has indicated his/her understanding and acceptance.     Dental Advisory Given  Plan Discussed with: CRNA  Anesthesia Plan Comments:        Anesthesia Quick Evaluation

## 2024-02-19 NOTE — H&P (Signed)
  [facility]    SUBJECTIVE: Patient presents for elective DCCV for AFIB. No new changes.   Vitals:   02/19/24 0651  BP: (!) 118/59  Pulse: (!) 111  Resp: (!) 25  Temp: 98.6 F (37 C)  TempSrc: Oral  Weight: 91.6 kg  Height: 5\' 2"  (1.575 m)    No intake or output data in the 24 hours ending 02/19/24 0736    PHYSICAL EXAM  General: Well developed, well nourished, in no acute distress HEENT:  Normocephalic and atramatic Neck:  No JVD.  Lungs: Clear bilaterally to auscultation and percussion. Heart: Tachycardia . Normal S1 and S2 without gallops or murmurs.  Abdomen: Bowel sounds are positive, abdomen soft and non-tender  Msk:  Back normal, normal gait. Normal strength and tone for age. Extremities: No clubbing, cyanosis or edema.   Neuro: Alert and oriented X 3. Psych:  Good affect, responds appropriately   LABS: Basic Metabolic Panel: No results for input(s): "NA", "K", "CL", "CO2", "GLUCOSE", "BUN", "CREATININE", "CALCIUM ", "MG", "PHOS" in the last 72 hours. Liver Function Tests: No results for input(s): "AST", "ALT", "ALKPHOS", "BILITOT", "PROT", "ALBUMIN" in the last 72 hours. No results for input(s): "LIPASE", "AMYLASE" in the last 72 hours. CBC: No results for input(s): "WBC", "NEUTROABS", "HGB", "HCT", "MCV", "PLT" in the last 72 hours. Cardiac Enzymes: No results for input(s): "CKTOTAL", "CKMB", "CKMBINDEX", "TROPONINI" in the last 72 hours. BNP: Invalid input(s): "POCBNP" D-Dimer: No results for input(s): "DDIMER" in the last 72 hours. Hemoglobin A1C: No results for input(s): "HGBA1C" in the last 72 hours. Fasting Lipid Panel: No results for input(s): "CHOL", "HDL", "LDLCALC", "TRIG", "CHOLHDL", "LDLDIRECT" in the last 72 hours. Thyroid  Function Tests: No results for input(s): "TSH", "T4TOTAL", "T3FREE", "THYROIDAB" in the last 72 hours.  Invalid input(s): "FREET3" Anemia Panel: No results for input(s): "VITAMINB12", "FOLATE", "FERRITIN", "TIBC",  "IRON ", "RETICCTPCT" in the last 72 hours.  No results found.   Echo Preserved 50%  TELEMETRY: Atrial Flutter 100 :  ASSESSMENT AND PLAN:  Active Problems:   * No active hospital problems. *    Imp Pre-op cardioversion HTN Obesity  . Plan Proceed with DCCV   Antonette Batters, MD, 02/19/2024 7:36 AM

## 2024-02-19 NOTE — Transfer of Care (Signed)
 Immediate Anesthesia Transfer of Care Note  Patient: Theresa [name]  Procedure(s) Performed: CARDIOVERSION  Patient Location:  Specials  Anesthesia Type:General  Level of Consciousness: awake, alert , and oriented  Airway & Oxygen  Therapy: Patient Spontanous Breathing and Patient connected to nasal cannula oxygen   Post-op Assessment: Report given to RN and Post -op Vital signs reviewed and stable  Post vital signs: Reviewed and stable  Last Vitals:  Vitals Value Taken Time  BP 97/61 02/19/24 0748  Temp    Pulse 76 02/19/24 0750  Resp 22 02/19/24 0750  SpO2 99 % 02/19/24 0750  Vitals shown include unfiled device data.  Last Pain:  Vitals:   02/19/24 0651  TempSrc: Oral  PainSc: 9       Patients Stated Pain Goal: 0 (02/19/24 0651)  Complications: No notable events documented.

## 2024-02-19 NOTE — CV Procedure (Signed)
 Electrical Cardioversion Procedure Note   Procedure: Electrical Cardioversion Indications:  Atrial Flutter  Procedure Details Consent: Risks of procedure as well as the alternatives and risks of each were explained to the (patient/caregiver).  Consent for procedure obtained. Time Out: Verified patient identification, verified procedure, site/side was marked, verified correct patient position, special equipment/implants available, medications/allergies/relevent history reviewed, required imaging and test results available.  Performed  Patient placed on cardiac monitor, pulse oximetry, supplemental oxygen  as necessary.  Sedation given:  Propofol  as per anesthesia Pacer pads placed anterior and posterior chest.  Cardioverted 1 time(s).  Cardioverted at 200J.  Evaluation Findings: Post procedure EKG shows: NSR Complications: None Patient did tolerate procedure well.   Burney Carter MD 02/19/24

## 2024-02-20 ENCOUNTER — Encounter: Payer: Self-pay | Admitting: Internal Medicine

## 2024-02-21 NOTE — Anesthesia Postprocedure Evaluation (Signed)
 Anesthesia Post Note  Patient: Theresa [name]  Procedure(s) Performed: CARDIOVERSION  Patient location during evaluation: PACU Anesthesia Type: General Level of consciousness: awake and alert Pain management: pain level controlled Vital Signs Assessment: post-procedure vital signs reviewed and stable Respiratory status: spontaneous breathing, nonlabored ventilation, respiratory function stable and patient connected to nasal cannula oxygen  Cardiovascular status: blood pressure returned to baseline and stable Postop Assessment: no apparent nausea or vomiting Anesthetic complications: no   No notable events documented.   Last Vitals:  Vitals:   02/19/24 0815 02/19/24 0830  BP: (!) 106/52 (!) 119/58  Pulse: 82   Resp: (!) 25 19  Temp:    SpO2: 96% 98%    Last Pain:  Vitals:   02/19/24 0830  TempSrc:   PainSc: 0-No pain                 Zula Hitch

## 2024-02-26 ENCOUNTER — Other Ambulatory Visit: Payer: Self-pay | Admitting: Family

## 2024-02-26 NOTE — Telephone Encounter (Signed)
 Refills requested on Mounjaro

## 2024-02-27 ENCOUNTER — Encounter: Payer: Self-pay | Admitting: Oncology

## 2024-02-28 ENCOUNTER — Other Ambulatory Visit: Payer: Self-pay | Admitting: Family Medicine

## 2024-02-28 DIAGNOSIS — Z0000 Encounter for general adult medical examination without abnormal findings: Secondary | ICD-10-CM

## 2024-03-02 ENCOUNTER — Other Ambulatory Visit: Payer: Self-pay

## 2024-03-02 ENCOUNTER — Emergency Department

## 2024-03-02 ENCOUNTER — Inpatient Hospital Stay
Admission: EM | Admit: 2024-03-02 | Discharge: 2024-03-07 | DRG: 920 | Disposition: A | Discharge status: Home / Self Care | Attending: Internal Medicine | Admitting: Internal Medicine

## 2024-03-02 DIAGNOSIS — Z885 Allergy status to narcotic agent status: Secondary | ICD-10-CM

## 2024-03-02 DIAGNOSIS — J449 Chronic obstructive pulmonary disease, unspecified: Secondary | ICD-10-CM | POA: Diagnosis present

## 2024-03-02 DIAGNOSIS — Z7985 Long-term (current) use of injectable non-insulin antidiabetic drugs: Secondary | ICD-10-CM | POA: Diagnosis not present

## 2024-03-02 DIAGNOSIS — I110 Hypertensive heart disease with heart failure: Secondary | ICD-10-CM | POA: Diagnosis present

## 2024-03-02 DIAGNOSIS — F17210 Nicotine dependence, cigarettes, uncomplicated: Secondary | ICD-10-CM | POA: Diagnosis present

## 2024-03-02 DIAGNOSIS — J310 Chronic rhinitis: Secondary | ICD-10-CM | POA: Diagnosis present

## 2024-03-02 DIAGNOSIS — J439 Emphysema, unspecified: Secondary | ICD-10-CM | POA: Diagnosis not present

## 2024-03-02 DIAGNOSIS — Z5986 Financial insecurity: Secondary | ICD-10-CM

## 2024-03-02 DIAGNOSIS — E66812 Obesity, class 2: Secondary | ICD-10-CM | POA: Diagnosis present

## 2024-03-02 DIAGNOSIS — Z5941 Food insecurity: Secondary | ICD-10-CM

## 2024-03-02 DIAGNOSIS — Z90710 Acquired absence of both cervix and uterus: Secondary | ICD-10-CM

## 2024-03-02 DIAGNOSIS — I272 Pulmonary hypertension, unspecified: Secondary | ICD-10-CM | POA: Diagnosis present

## 2024-03-02 DIAGNOSIS — D509 Iron deficiency anemia, unspecified: Secondary | ICD-10-CM

## 2024-03-02 DIAGNOSIS — I5032 Chronic diastolic (congestive) heart failure: Secondary | ICD-10-CM | POA: Diagnosis present

## 2024-03-02 DIAGNOSIS — Z888 Allergy status to other drugs, medicaments and biological substances status: Secondary | ICD-10-CM

## 2024-03-02 DIAGNOSIS — Z8616 Personal history of COVID-19: Secondary | ICD-10-CM

## 2024-03-02 DIAGNOSIS — D122 Benign neoplasm of ascending colon: Secondary | ICD-10-CM | POA: Diagnosis present

## 2024-03-02 DIAGNOSIS — I2510 Atherosclerotic heart disease of native coronary artery without angina pectoris: Secondary | ICD-10-CM | POA: Diagnosis present

## 2024-03-02 DIAGNOSIS — Z7951 Long term (current) use of inhaled steroids: Secondary | ICD-10-CM

## 2024-03-02 DIAGNOSIS — D123 Benign neoplasm of transverse colon: Secondary | ICD-10-CM | POA: Diagnosis present

## 2024-03-02 DIAGNOSIS — G894 Chronic pain syndrome: Secondary | ICD-10-CM | POA: Diagnosis present

## 2024-03-02 DIAGNOSIS — Y838 Other surgical procedures as the cause of abnormal reaction of the patient, or of later complication, without mention of misadventure at the time of the procedure: Secondary | ICD-10-CM | POA: Diagnosis present

## 2024-03-02 DIAGNOSIS — D500 Iron deficiency anemia secondary to blood loss (chronic): Secondary | ICD-10-CM | POA: Diagnosis present

## 2024-03-02 DIAGNOSIS — D649 Anemia, unspecified: Principal | ICD-10-CM

## 2024-03-02 DIAGNOSIS — K922 Gastrointestinal hemorrhage, unspecified: Secondary | ICD-10-CM | POA: Diagnosis present

## 2024-03-02 DIAGNOSIS — K219 Gastro-esophageal reflux disease without esophagitis: Secondary | ICD-10-CM | POA: Diagnosis present

## 2024-03-02 DIAGNOSIS — J9611 Chronic respiratory failure with hypoxia: Secondary | ICD-10-CM | POA: Diagnosis present

## 2024-03-02 DIAGNOSIS — Z5948 Other specified lack of adequate food: Secondary | ICD-10-CM

## 2024-03-02 DIAGNOSIS — K921 Melena: Secondary | ICD-10-CM | POA: Diagnosis present

## 2024-03-02 DIAGNOSIS — K91840 Postprocedural hemorrhage and hematoma of a digestive system organ or structure following a digestive system procedure: Secondary | ICD-10-CM | POA: Diagnosis present

## 2024-03-02 DIAGNOSIS — D62 Acute posthemorrhagic anemia: Secondary | ICD-10-CM | POA: Diagnosis present

## 2024-03-02 DIAGNOSIS — Z79899 Other long term (current) drug therapy: Secondary | ICD-10-CM | POA: Diagnosis not present

## 2024-03-02 DIAGNOSIS — Z9981 Dependence on supplemental oxygen: Secondary | ICD-10-CM | POA: Diagnosis not present

## 2024-03-02 DIAGNOSIS — K635 Polyp of colon: Secondary | ICD-10-CM

## 2024-03-02 DIAGNOSIS — I480 Paroxysmal atrial fibrillation: Secondary | ICD-10-CM | POA: Diagnosis present

## 2024-03-02 DIAGNOSIS — Z8249 Family history of ischemic heart disease and other diseases of the circulatory system: Secondary | ICD-10-CM

## 2024-03-02 DIAGNOSIS — K621 Rectal polyp: Secondary | ICD-10-CM | POA: Diagnosis not present

## 2024-03-02 DIAGNOSIS — Z7901 Long term (current) use of anticoagulants: Secondary | ICD-10-CM | POA: Diagnosis not present

## 2024-03-02 DIAGNOSIS — Z8711 Personal history of peptic ulcer disease: Secondary | ICD-10-CM

## 2024-03-02 DIAGNOSIS — Z6838 Body mass index (BMI) 38.0-38.9, adult: Secondary | ICD-10-CM

## 2024-03-02 DIAGNOSIS — D508 Other iron deficiency anemias: Secondary | ICD-10-CM | POA: Diagnosis not present

## 2024-03-02 LAB — COMPREHENSIVE METABOLIC PANEL WITH GFR
ALT: 34 U/L (ref 0–44)
AST: 30 U/L (ref 15–41)
Albumin: 3.7 g/dL (ref 3.5–5.0)
Alkaline Phosphatase: 91 U/L (ref 38–126)
Anion gap: 7 (ref 5–15)
BUN: 11 mg/dL (ref 8–23)
CO2: 31 mmol/L (ref 22–32)
Calcium: 9 mg/dL (ref 8.9–10.3)
Chloride: 101 mmol/L (ref 98–111)
Creatinine, Ser: 0.75 mg/dL (ref 0.44–1.00)
GFR, Estimated: >60 mL/min (ref >60)
Glucose, Bld: 104 mg/dL — ABNORMAL HIGH (ref 70–99)
Potassium: 3.1 mmol/L — ABNORMAL LOW (ref 3.5–5.1)
Sodium: 139 mmol/L (ref 135–145)
Total Bilirubin: 0.5 mg/dL (ref 0.0–1.2)
Total Protein: 7 g/dL (ref 6.5–8.1)

## 2024-03-02 LAB — MAGNESIUM: Magnesium: 2.8 mg/dL — ABNORMAL HIGH (ref 1.7–2.4)

## 2024-03-02 LAB — CBC WITH DIFFERENTIAL/PLATELET
Abs Immature Granulocytes: 0.05 10*3/uL (ref 0.00–0.07)
Basophils Absolute: 0 10*3/uL (ref 0.0–0.1)
Basophils Relative: 0 %
Eosinophils Absolute: 0.1 10*3/uL (ref 0.0–0.5)
Eosinophils Relative: 2 %
HCT: 25.4 % — ABNORMAL LOW (ref 36.0–46.0)
Hemoglobin: 6.6 g/dL — ABNORMAL LOW (ref 12.0–15.0)
Immature Granulocytes: 1 %
Lymphocytes Relative: 11 %
Lymphs Abs: 0.8 10*3/uL (ref 0.7–4.0)
MCH: 19.8 pg — ABNORMAL LOW (ref 26.0–34.0)
MCHC: 26 g/dL — ABNORMAL LOW (ref 30.0–36.0)
MCV: 76 fL — ABNORMAL LOW (ref 80.0–100.0)
Monocytes Absolute: 0.5 10*3/uL (ref 0.1–1.0)
Monocytes Relative: 7 %
Neutro Abs: 5.3 10*3/uL (ref 1.7–7.7)
Neutrophils Relative %: 79 %
Platelets: 291 10*3/uL (ref 150–400)
RBC: 3.34 MIL/uL — ABNORMAL LOW (ref 3.87–5.11)
RDW: 21.2 % — ABNORMAL HIGH (ref 11.5–15.5)
Smear Review: NORMAL
WBC: 6.7 10*3/uL (ref 4.0–10.5)
nRBC: 0 % (ref 0.0–0.2)

## 2024-03-02 LAB — HEMOGLOBIN AND HEMATOCRIT, BLOOD
HCT: 26.5 % — ABNORMAL LOW (ref 36.0–46.0)
HCT: 27.8 % — ABNORMAL LOW (ref 36.0–46.0)
Hemoglobin: 7.4 g/dL — ABNORMAL LOW (ref 12.0–15.0)
Hemoglobin: 7.6 g/dL — ABNORMAL LOW (ref 12.0–15.0)

## 2024-03-02 LAB — RETICULOCYTES
Immature Retic Fract: 4.4 % (ref 2.3–15.9)
RBC.: 3.31 MIL/uL — ABNORMAL LOW (ref 3.87–5.11)
Retic Count, Absolute: 54 10*3/uL (ref 19.0–186.0)
Retic Ct Pct: 1.8 % (ref 0.4–3.1)

## 2024-03-02 LAB — SAMPLE TO BLOOD BANK

## 2024-03-02 LAB — PREPARE RBC (CROSSMATCH)

## 2024-03-02 LAB — PROTIME-INR
INR: 1.3 — ABNORMAL HIGH (ref 0.8–1.2)
Prothrombin Time: 16.5 s — ABNORMAL HIGH (ref 11.4–15.2)

## 2024-03-02 LAB — IRON AND TIBC
Iron: 18 ug/dL — ABNORMAL LOW (ref 28–170)
Saturation Ratios: 4 % — ABNORMAL LOW (ref 10.4–31.8)
TIBC: 501 ug/dL — ABNORMAL HIGH (ref 250–450)
UIBC: 483 ug/dL

## 2024-03-02 LAB — APTT: aPTT: 36 s (ref 24–36)

## 2024-03-02 LAB — FERRITIN: Ferritin: 11 ng/mL (ref 11–307)

## 2024-03-02 MED ORDER — ONDANSETRON HCL 4 MG/2ML IJ SOLN
4.0000 mg | Freq: Once | INTRAMUSCULAR | Status: AC
  Administered 2024-03-02: 4 mg via INTRAVENOUS
  Filled 2024-03-02: qty 2

## 2024-03-02 MED ORDER — BUDESON-GLYCOPYRROL-FORMOTEROL 160-9-4.8 MCG/ACT IN AERO
2.0000 | INHALATION_SPRAY | Freq: Two times a day (BID) | RESPIRATORY_TRACT | Status: DC
  Administered 2024-03-02 – 2024-03-07 (×10): 2 via RESPIRATORY_TRACT
  Filled 2024-03-02: qty 5.9

## 2024-03-02 MED ORDER — ONDANSETRON HCL 4 MG PO TABS
4.0000 mg | ORAL_TABLET | Freq: Four times a day (QID) | ORAL | Status: DC | PRN
Start: 2024-03-02 — End: 2024-03-07
  Administered 2024-03-04: 4 mg via ORAL
  Filled 2024-03-02: qty 1

## 2024-03-02 MED ORDER — HYDRALAZINE HCL 20 MG/ML IJ SOLN: 5.0000 mg | Freq: Four times a day (QID) | INTRAMUSCULAR | Status: DC | PRN

## 2024-03-02 MED ORDER — IOHEXOL 300 MG/ML  SOLN
100.0000 mL | Freq: Once | INTRAMUSCULAR | Status: AC | PRN
  Administered 2024-03-02: 100 mL via INTRAVENOUS

## 2024-03-02 MED ORDER — ROFLUMILAST 500 MCG PO TABS
500.0000 ug | ORAL_TABLET | Freq: Every day | ORAL | Status: DC
  Administered 2024-03-03 – 2024-03-07 (×4): 500 ug via ORAL
  Filled 2024-03-02 (×7): qty 1

## 2024-03-02 MED ORDER — HYDROCODONE-ACETAMINOPHEN 5-325 MG PO TABS
1.0000 | ORAL_TABLET | Freq: Three times a day (TID) | ORAL | Status: DC | PRN
  Administered 2024-03-02 – 2024-03-07 (×10): 1 via ORAL
  Filled 2024-03-02 (×10): qty 1

## 2024-03-02 MED ORDER — POTASSIUM CHLORIDE CRYS ER 20 MEQ PO TBCR
40.0000 meq | EXTENDED_RELEASE_TABLET | Freq: Once | ORAL | Status: AC
  Administered 2024-03-02: 40 meq via ORAL
  Filled 2024-03-02: qty 2

## 2024-03-02 MED ORDER — PANTOPRAZOLE SODIUM 40 MG IV SOLR
40.0000 mg | Freq: Two times a day (BID) | INTRAVENOUS | Status: DC
  Administered 2024-03-02 – 2024-03-07 (×11): 40 mg via INTRAVENOUS
  Filled 2024-03-02 (×11): qty 10

## 2024-03-02 MED ORDER — IPRATROPIUM-ALBUTEROL 0.5-2.5 (3) MG/3ML IN SOLN: 3.0000 mL | Freq: Four times a day (QID) | RESPIRATORY_TRACT | Status: DC | PRN

## 2024-03-02 MED ORDER — ACETAMINOPHEN 500 MG PO TABS
1000.0000 mg | ORAL_TABLET | Freq: Three times a day (TID) | ORAL | Status: DC | PRN
  Administered 2024-03-03 – 2024-03-05 (×2): 1000 mg via ORAL
  Filled 2024-03-02 (×2): qty 2

## 2024-03-02 MED ORDER — SODIUM CHLORIDE 0.9 % IV SOLN
10.0000 mL/h | Freq: Once | INTRAVENOUS | Status: AC
  Administered 2024-03-02: 10 mL/h via INTRAVENOUS

## 2024-03-02 MED ORDER — NYSTATIN 100000 UNIT/ML MT SUSP
5.0000 mL | Freq: Four times a day (QID) | OROMUCOSAL | Status: DC
  Administered 2024-03-02 – 2024-03-07 (×18): 500000 [IU] via ORAL
  Filled 2024-03-02 (×21): qty 5

## 2024-03-02 MED ORDER — SERTRALINE HCL 50 MG PO TABS
200.0000 mg | ORAL_TABLET | Freq: Every day | ORAL | Status: DC
  Administered 2024-03-03 – 2024-03-07 (×5): 200 mg via ORAL
  Filled 2024-03-02 (×5): qty 4

## 2024-03-02 MED ORDER — ONDANSETRON HCL 4 MG/2ML IJ SOLN: 4.0000 mg | Freq: Four times a day (QID) | INTRAMUSCULAR | Status: DC | PRN

## 2024-03-02 MED ORDER — MORPHINE SULFATE (PF) 4 MG/ML IV SOLN
4.0000 mg | Freq: Once | INTRAVENOUS | Status: AC
  Administered 2024-03-02: 4 mg via INTRAVENOUS
  Filled 2024-03-02: qty 1

## 2024-03-02 MED ORDER — BUSPIRONE HCL 10 MG PO TABS
5.0000 mg | ORAL_TABLET | Freq: Two times a day (BID) | ORAL | Status: DC
  Administered 2024-03-02 – 2024-03-07 (×10): 5 mg via ORAL
  Filled 2024-03-02 (×10): qty 1

## 2024-03-02 MED ORDER — AMIODARONE HCL 200 MG PO TABS
200.0000 mg | ORAL_TABLET | Freq: Every day | ORAL | Status: DC
  Administered 2024-03-03 – 2024-03-07 (×5): 200 mg via ORAL
  Filled 2024-03-02 (×5): qty 1

## 2024-03-02 NOTE — H&P (Signed)
 History and Physical    Theresa [name]:[id number] DOB: 1956-10-01 DOA: 03/02/2024  PCP: Arlen Belton, MD (Confirm with patient/family/NH records and if not entered, this has to be entered at [facility] point of entry) Patient coming from: Home  I have personally briefly reviewed patient's old medical records in [facility]  Chief Complaint: Feeling tired, SOB.  HPI: Theresa [name] is a 68 y.o. female with medical history significant of PAF on Xarelto , HTN/HFpEF, COPD Gold stage III, chronic hypoxic respiratory failure on 4-5 L continuously, GERD, anxiety/depression, obesity, presented with worsening of general weakness, exertional dyspnea with intermittent abdominal pain.  Symptoms started about 1 month ago, patient started having intermittent epigastric pain, dull ache, as frequent as 1-2 times a day, appears to have no clear association with meals takings but does have some nocturnal pain sometimes, no nausea vomiting, denied seeing any black tarry stool, no diarrhea no fever chills no weight loss. She only takes oxycodone  for back pain, does not take NSAIDS or EtOH. No alcohol consumption.  ED Course: Afebrile, no tachycardia blood pressure 120/60 O2 saturation 1 4% on 6 L.  CT abdomen pelvis with contrast showed no acute findings.  Blood work showed hemoglobin 6.6 compared to baseline 11.9 about 6 months ago.  BUN 11 creatinine 0.7.  Vitafusion did acquire text which came back negative.  Patient was given Pepcid  x 1 in the ED.  Review of Systems: As per HPI otherwise 14 point review of systems negative.    Past Medical History:  Diagnosis Date   Acute postoperative pain 10/03/2017   Anxiety    BiPAP (biphasic positive airway pressure) dependence    at hs   Carpal tunnel syndrome    CHF (congestive heart failure) (HCC)    1/18   CHF (congestive heart failure) (HCC)    Chronic generalized abdominal pain    Chronic rhinitis    COPD (chronic obstructive pulmonary  disease) (HCC)    DDD (degenerative disc disease), cervical    DDD (degenerative disc disease), lumbosacral    Depression    Dyspnea    Edema    Flu    1/18   Gastritis    GERD (gastroesophageal reflux disease)    Hematuria    Hernia of abdominal wall 07/17/2018   Hernia, abdominal    Hypertension    Kidney stones    Low back pain    Lumbar radiculitis    Malodorous urine    Muscle weakness    Obesity    Oxygen  dependent    Pneumonia due to COVID-19 virus 02/03/2020   Renal cyst    Sensory urge incontinence    Thyroid  activity decreased    9/19   Tobacco abuse    Wheezing     Past Surgical History:  Procedure Laterality Date   ABDOMINAL HYSTERECTOMY     CARDIOVERSION N/A 04/29/2022   Procedure: CARDIOVERSION;  Surgeon: Michelle Aid, MD;  Location: [facility] ORS;  Service: Cardiovascular;  Laterality: N/A;   CARDIOVERSION N/A 01/30/2024   Procedure: CARDIOVERSION;  Surgeon: Antonette Batters, MD;  Location: [facility] ORS;  Service: Cardiovascular;  Laterality: N/A;   CARDIOVERSION N/A 02/19/2024   Procedure: CARDIOVERSION;  Surgeon: Antonette Batters, MD;  Location: [facility] ORS;  Service: Cardiovascular;  Laterality: N/A;   CARPAL TUNNEL RELEASE Left 2012   cataract surgery Bilateral    EPIGASTRIC HERNIA REPAIR N/A 08/13/2018   HERNIA REPAIR EPIGASTRIC ADULT; polypropylene mesh reinforcement.  Surgeon: Marshall Skeeter,  MD;  Location: [facility] ORS;  Service: General;  Laterality: N/A;   ESOPHAGOGASTRODUODENOSCOPY N/A 06/21/2022   Procedure: ESOPHAGOGASTRODUODENOSCOPY (EGD);  Surgeon: Toledo, Alphonsus Jeans, MD;  Location: [facility] ENDOSCOPY;  Service: Gastroenterology;  Laterality: N/A;   RIGHT/LEFT HEART CATH AND CORONARY ANGIOGRAPHY N/A 07/11/2019   Procedure: RIGHT/LEFT HEART CATH AND CORONARY ANGIOGRAPHY;  Surgeon: Antonette Batters, MD;  Location: [facility] INVASIVE CV [facility];  Service: Cardiovascular;  Laterality: N/A;   TEE WITHOUT CARDIOVERSION N/A 04/29/2022   Procedure: TRANSESOPHAGEAL  ECHOCARDIOGRAM (TEE);  Surgeon: Michelle Aid, MD;  Location: [facility] ORS;  Service: Cardiovascular;  Laterality: N/A;   TONSILLECTOMY     TUBAL LIGATION     UMBILICAL HERNIA REPAIR N/A 08/13/2018   HERNIA REPAIR UMBILICAL ADULT; polypropylene mesh reinforcement Surgeon: Marshall Skeeter, MD;  Location: [facility] ORS;  Service: General;  Laterality: N/A;     reports that she has quit smoking. Her smoking use included cigarettes. She has a 66 pack-year smoking history. She has never used smokeless tobacco. She reports current drug use. She reports that she does not drink alcohol.  Allergies  Allergen Reactions   Gabapentin  Other (See Comments)    Higher doses of 800 mg, 600 mg, 300 mg causes excessive sedation and contributes to numerous mechanical falls. (Unsure if 100 mg dose can be tolerated)   Codeine Nausea And Vomiting   Meloxicam  Other (See Comments)    Stomach pain    Family History  Problem Relation Age of Onset   Heart disease Mother    Stroke Mother    Coronary artery disease Mother    Alcohol abuse Father    Heart disease Father    Lung cancer Sister    Cancer Sister    Breast cancer Sister    Cancer Brother    Cancer Brother    Kidney cancer Brother    Pneumonia Brother    Prostate cancer Neg Hx    Bladder Cancer Neg Hx      Prior to Admission medications   Medication Sig Start Date End Date Taking? Authorizing Provider  amiodarone  (PACERONE ) 200 MG tablet Take 1 tablet (200 mg total) by mouth daily. 04/30/22  Yes Vernal Hritz, Dekui, MD  nystatin (MYCOSTATIN) 100000 UNIT/ML suspension Take 5 mLs by mouth 4 (four) times daily. 02/22/24  Yes [provider]  potassium chloride  SA (KLOR-CON  M) 20 MEQ tablet Take 2 tablets every morning and 1 tablet every evening 06/22/22  Yes Hackney, Tina A, FNP  acetaminophen  (TYLENOL ) 500 MG tablet Take 1,000 mg by mouth every 8 (eight) hours as needed for mild pain or headache.    [provider]  albuterol  (VENTOLIN  HFA)  108 (90 Base) MCG/ACT inhaler Inhale 2 puffs into the lungs every 6 (six) hours as needed for wheezing or shortness of breath. 10/23/19   Isa Manuel, MD  busPIRone  (BUSPAR ) 5 MG tablet Take 5 mg by mouth 2 (two) times daily.    [provider]  Cholecalciferol  50 MCG (2000 UT) CAPS Take 2,000 Units by mouth daily.    [provider]  dapagliflozin  propanediol (FARXIGA ) 10 MG TABS tablet Take 1 tablet (10 mg total) by mouth daily. 07/31/23   Charlette Console, FNP  diltiazem  (CARDIZEM  CD) 180 MG 24 hr capsule Take 180 mg by mouth daily.    [provider]  Fluticasone -Umeclidin-Vilant (TRELEGY ELLIPTA) 100-62.5-25 MCG/ACT AEPB Inhale 1 puff into the lungs daily.    [provider]  FUROSCIX  80 MG/10ML CTKT Inject 80 mg into  the skin daily as needed (If gain over 3x's weight in fluid). 09/02/22   [provider]  HYDROcodone -acetaminophen  (NORCO/VICODIN) 5-325 MG tablet Take 1 tablet by mouth every 8 (eight) hours as needed for severe pain (pain score 7-10). Must last 30 days 01/26/24 02/25/24  Renaldo Caroli, MD  HYDROcodone -acetaminophen  (NORCO/VICODIN) 5-325 MG tablet Take 1 tablet by mouth every 8 (eight) hours as needed for severe pain (pain score 7-10). Must last 30 days 02/25/24 03/26/24  Renaldo Caroli, MD  HYDROcodone -acetaminophen  (NORCO/VICODIN) 5-325 MG tablet Take 1 tablet by mouth every 8 (eight) hours as needed for severe pain (pain score 7-10). Must last 30 days 03/26/24 04/25/24  Renaldo Caroli, MD  ipratropium-albuterol  (DUONEB) 0.5-2.5 (3) MG/3ML SOLN USE 1 VIAL VIA NEBULIZER EVERY 6 HOURS AS NEEDED FOR SHORTNESS OF BREATH OR WHEEZING 11/30/20   Chrismon, Ortencia Blamer, PA-C  naloxone  (NARCAN ) nasal spray 4 mg/0.1 mL Place 1 spray into the nose as needed for up to 365 doses (for opioid-induced respiratory depresssion). In case of emergency (overdose), spray once into each nostril. If no response within 3 minutes, repeat application and call  911. 07/26/23 07/25/24  Renaldo Caroli, MD  omeprazole  (PRILOSEC) 40 MG capsule Take 40 mg by mouth daily.    [provider]  OXYGEN  Inhale 5 L into the lungs continuous.    [provider]  Rivaroxaban  (XARELTO ) 15 MG TABS tablet Take 15 mg by mouth daily with supper.    [provider]  roflumilast  (DALIRESP ) 500 MCG TABS tablet Take 500 mcg by mouth daily.    [provider]  sertraline  (ZOLOFT ) 100 MG tablet Take 2 tablets (200 mg total) by mouth daily. Please schedule office visit before any future refill. 03/28/23   Lamon Pillow, MD  spironolactone  (ALDACTONE ) 25 MG tablet Take 0.5 tablets (12.5 mg total) by mouth daily. 08/07/23   Charlette Console, FNP  tirzepatide [facility]) 12.5 MG/0.5ML Pen Inject 12.5 mg into the skin once a week. 02/26/24   Charlette Console, FNP  torsemide  (DEMADEX ) 20 MG tablet Take 40-80 mg by mouth See admin instructions. Take 4 tablets (80mg ) by mouth every morning and take 2 tablets (40mg ) by mouth every afternoon    [provider]    Physical Exam: Vitals:   03/02/24 1040 03/02/24 1225 03/02/24 1259  BP: 122/66 (!) 119/59 135/68  Pulse: 79 73 74  Resp: 20 (!) 21 (!) 22  Temp: 98.4 F (36.9 C) 98.1 F (36.7 C) 98.5 F (36.9 C)  TempSrc: Oral Oral   SpO2: 100% 100% 100%    Constitutional: NAD, calm, comfortable Vitals:   03/02/24 1040 03/02/24 1225 03/02/24 1259  BP: 122/66 (!) 119/59 135/68  Pulse: 79 73 74  Resp: 20 (!) 21 (!) 22  Temp: 98.4 F (36.9 C) 98.1 F (36.7 C) 98.5 F (36.9 C)  TempSrc: Oral Oral   SpO2: 100% 100% 100%   Eyes: PERRL, lids and conjunctivae normal ENMT: Mucous membranes are moist. Posterior pharynx clear of any exudate or lesions.Normal dentition.  Neck: normal, supple, no masses, no thyromegaly Respiratory: clear to auscultation bilaterally, no wheezing, no crackles. Normal respiratory effort. No accessory muscle use.  Cardiovascular: Regular rate and rhythm, no  murmurs / rubs / gallops. No extremity edema. 2+ pedal pulses. No carotid bruits.  Abdomen: no tenderness, no masses palpated. No hepatosplenomegaly. Bowel sounds positive.  Musculoskeletal: no clubbing / cyanosis. No joint deformity upper and lower extremities. Good ROM, no contractures. Normal muscle tone.  Skin:  no rashes, lesions, ulcers. No induration Neurologic: CN 2-12 grossly intact. Sensation intact, DTR normal. Strength 5/5 in all 4.  Psychiatric: Normal judgment and insight. Alert and oriented x 3. Normal mood.     Labs on Admission: I have personally reviewed following labs and imaging studies  CBC: Recent Labs  Lab 03/02/24 1100  WBC 6.7  NEUTROABS 5.3  HGB 6.6*  HCT 25.4*  MCV 76.0*  PLT 291   Basic Metabolic Panel: Recent Labs  Lab 03/02/24 1100  NA 139  K 3.1*  CL 101  CO2 31  GLUCOSE 104*  BUN 11  CREATININE 0.75  CALCIUM  9.0   GFR: Estimated Creatinine Clearance: 72.8 mL/min (by C-G formula based on SCr of 0.75 mg/dL). Liver Function Tests: Recent Labs  Lab 03/02/24 1100  AST 30  ALT 34  ALKPHOS 91  BILITOT 0.5  PROT 7.0  ALBUMIN 3.7   No results for input(s): "LIPASE", "AMYLASE" in the last 168 hours. No results for input(s): "AMMONIA" in the last 168 hours. Coagulation Profile: Recent Labs  Lab 03/02/24 1100  INR 1.3*   Cardiac Enzymes: No results for input(s): "CKTOTAL", "CKMB", "CKMBINDEX", "TROPONINI" in the last 168 hours. BNP (last 3 results) No results for input(s): "PROBNP" in the last 8760 hours. HbA1C: No results for input(s): "HGBA1C" in the last 72 hours. CBG: No results for input(s): "GLUCAP" in the last 168 hours. Lipid Profile: No results for input(s): "CHOL", "HDL", "LDLCALC", "TRIG", "CHOLHDL", "LDLDIRECT" in the last 72 hours. Thyroid  Function Tests: No results for input(s): "TSH", "T4TOTAL", "FREET4", "T3FREE", "THYROIDAB" in the last 72 hours. Anemia Panel: No results for input(s): "VITAMINB12", "FOLATE",  "FERRITIN", "TIBC", "IRON ", "RETICCTPCT" in the last 72 hours. Urine analysis:    Component Value Date/Time   COLORURINE YELLOW (A) 11/07/2022 1902   APPEARANCEUR CLEAR (A) 11/07/2022 1902   APPEARANCEUR Cloudy (A) 11/28/2016 1128   LABSPEC 1.015 11/07/2022 1902   LABSPEC 1.011 06/12/2014 2003   PHURINE 6.0 11/07/2022 1902   GLUCOSEU >=500 (A) 11/07/2022 1902   GLUCOSEU Negative 06/12/2014 2003   HGBUR NEGATIVE 11/07/2022 1902   BILIRUBINUR NEGATIVE 11/07/2022 1902   BILIRUBINUR Negative 10/30/2019 1516   BILIRUBINUR Negative 11/28/2016 1128   BILIRUBINUR Negative 06/12/2014 2003   KETONESUR NEGATIVE 11/07/2022 1902   PROTEINUR NEGATIVE 11/07/2022 1902   UROBILINOGEN 0.2 10/30/2019 1516   UROBILINOGEN 0.2 07/30/2013 1305   NITRITE NEGATIVE 11/07/2022 1902   LEUKOCYTESUR NEGATIVE 11/07/2022 1902   LEUKOCYTESUR Negative 06/12/2014 2003    Radiological Exams on Admission: CT ABDOMEN PELVIS W CONTRAST Result Date: 03/02/2024 CLINICAL DATA:  Blunt abdominal trauma EXAM: CT ABDOMEN AND PELVIS WITH CONTRAST TECHNIQUE: Multidetector CT imaging of the abdomen and pelvis was performed using the standard protocol following bolus administration of intravenous contrast. RADIATION DOSE REDUCTION: This exam was performed according to the departmental dose-optimization program which includes automated exposure control, adjustment of the mA and/or kV according to patient size and/or use of iterative reconstruction technique. CONTRAST:  OMNIPAQUE  IOHEXOL  300 MG/ML  SOLN COMPARISON:  06/19/2022 FINDINGS: Lower chest: No acute pleural or parenchymal lung disease. Hepatobiliary: Scattered hepatic cysts are again noted. No other focal liver abnormalities. Gallbladder is unremarkable. No biliary duct dilation. Pancreas: Unremarkable. No pancreatic ductal dilatation or surrounding inflammatory changes. Spleen: Normal in size without focal abnormality. Adrenals/Urinary Tract: Stable appearance of the  adrenals. Nonobstructing 6 mm calculus lower pole left kidney. Right kidney is unremarkable. Bladder is moderately distended without focal abnormality. Stomach/Bowel: No bowel obstruction or ileus. Normal  appendix right lower quadrant. No bowel wall thickening or inflammatory change. Vascular/Lymphatic: Aortic atherosclerosis. No enlarged abdominal or pelvic lymph nodes. Reproductive: Uterus and bilateral adnexa are unremarkable. Other: No free fluid or free intraperitoneal gas. No abdominal wall hernia. Musculoskeletal: No acute or destructive bony abnormalities. Reconstructed images demonstrate no additional findings. IMPRESSION: 1. No acute intra-abdominal or intrapelvic trauma. 2. Nonobstructing 6 mm left renal calculus. 3.  Aortic Atherosclerosis (ICD10-I70.0). Electronically Signed   By: Bobbye Burrow M.D.   On: 03/02/2024 12:17    EKG: None  Assessment/Plan Principal Problem:   GI bleed Active Problems:   Symptomatic anemia   Anemia due to chronic blood loss  (please populate well all problems here in Problem List. (For example, if patient is on BP meds at home and you resume or decide to hold them, it is a problem that needs to be her. Same for CAD, COPD, HLD and so on)  Symptomatic anemia - Clinically suspect new onset of upper GI bleed given the history of recent onset of abdominal pain.  CT with contrast today did not show significant signs of active bleeding. -PPI twice daily - Send iron  study and FOBT -NPO for today till H/H stabilized - Repeat H&H this afternoon and tonight, transfuse for hemodynamic instability. - Consider GI consult if confirmed iron  deficiency.  COPD with chronic hypoxic respiratory failure - No acute concern, continue ICS and LABA - As needed albuterol   HTN Chronic HFpEF - Euvolemic - Hold off home BP meds including spironolactone , Cardizem , and Lasix   PAF - In sinus rhythm - Continue amiodarone  - Hold off Xarelto   Obesity - Estimated BMI more  than 35, calorie control recommended.  DVT prophylaxis: SCD Code Status: Full code Family Communication: None at bedside Disposition Plan: Patient is sick with symptomatic anemia and new onset abdominal pain, suspicious for new onset of GI bleed, may require inpatient GI workup, also need inpatient PRBC transfusion, expect more than 2 midnight hospital stay. Consults called: None Admission status: Tele admit   [place] MD [facility] Pager [phone]  03/02/2024, 1:03 PM

## 2024-03-02 NOTE — ED Triage Notes (Signed)
 Pt here for abnormal labs. States her blood levels are low. Pt has been having some dizziness and fell. Pt normally wears 6L [place] all the time.

## 2024-03-02 NOTE — ED Provider Notes (Signed)
 [facility] Provider Note    Event Date/Time   First MD Initiated Contact with Patient 03/02/24 1049     (approximate)   History   Chief Complaint abnormal labs   HPI  Theresa [name] is a 68 y.o. female with past medical history of COPD, chronic hypoxic respiratory failure on 6 L, diastolic CHF, pulmonary hypertension, atrial fibrillation on Xarelto , CAD, and chronic pain syndrome who presents to the ED complaining of abnormal labs.  Patient reports that she was called by her PCP earlier today and told that her blood counts were low and that she needed to come to the hospital.  She has not noticed any recent bleeding, specifically denies any bloody stools, hematuria, or nosebleeds.  She reports feeling fatigued over the past 24 hours but denies any chest pain or shortness of breath.  She does state that she had a fall about a 1 week ago and has noticed a small amount of bruising over her left flank, has had some tenderness to her left flank and left lower quadrant of her abdomen.  She denies hitting her head or losing consciousness with this fall.     Physical Exam   Triage Vital Signs: ED Triage Vitals  Encounter Vitals Group     BP 03/02/24 1040 122/66     Systolic BP Percentile --      Diastolic BP Percentile --      Pulse Rate 03/02/24 1040 79     Resp 03/02/24 1040 20     Temp 03/02/24 1040 98.4 F (36.9 C)     Temp Source 03/02/24 1040 Oral     SpO2 03/02/24 1040 100 %     Weight --      Height --      Head Circumference --      Peak Flow --      Pain Score 03/02/24 1044 (S) 8     Pain Loc --      Pain Education --      Exclude from Growth Chart --     Most recent vital signs: Vitals:   03/02/24 1040 03/02/24 1225  BP: 122/66 (!) 119/59  Pulse: 79 73  Resp: 20 (!) 21  Temp: 98.4 F (36.9 C) 98.1 F (36.7 C)  SpO2: 100% 100%    Constitutional: Alert and oriented. Eyes: Conjunctivae are normal. Head: Atraumatic. Nose: No  congestion/rhinnorhea. Mouth/Throat: Mucous membranes are moist.  Cardiovascular: Normal rate, regular rhythm. Grossly normal heart sounds.  2+ radial pulses bilaterally. Respiratory: Normal respiratory effort.  No retractions. Lungs CTAB. Gastrointestinal: Soft and tender to palpation over the left lower quadrant as well as left flank.  Small area of bruising noted over left flank with no obvious hematoma. No distention. Musculoskeletal: No lower extremity tenderness nor edema.  Neurologic:  Normal speech and language. No gross focal neurologic deficits are appreciated.    ED Results / Procedures / Treatments   Labs (all labs ordered are listed, but only abnormal results are displayed) Labs Reviewed  CBC WITH DIFFERENTIAL/PLATELET - Abnormal; Notable for the following components:      Result Value   RBC 3.34 (*)    Hemoglobin 6.6 (*)    HCT 25.4 (*)    MCV 76.0 (*)    MCH 19.8 (*)    MCHC 26.0 (*)    RDW 21.2 (*)    All other components within normal limits  PROTIME-INR - Abnormal; Notable for the following components:  Prothrombin Time 16.5 (*)    INR 1.3 (*)    All other components within normal limits  COMPREHENSIVE METABOLIC PANEL WITH GFR - Abnormal; Notable for the following components:   Potassium 3.1 (*)    Glucose, Bld 104 (*)    All other components within normal limits  APTT  IRON  AND TIBC  FERRITIN  RETICULOCYTES  SAMPLE TO BLOOD BANK  PREPARE RBC (CROSSMATCH)  TYPE AND SCREEN     RADIOLOGY CT abdomen/pelvis reviewed and interpreted by me with no dilated bowel loops, fluid collections, or flank Tory changes.  PROCEDURES:  Critical Care performed: Yes, see critical care procedure note(s)  .Critical Care  Performed by: Twilla Galea, MD Authorized by: Twilla Galea, MD   Critical care provider statement:    Critical care time (minutes):  30   Critical care time was exclusive of:  Separately billable procedures and treating other patients and  teaching time   Critical care was necessary to treat or prevent imminent or life-threatening deterioration of the following conditions: Anemia.   Critical care was time spent personally by me on the following activities:  Development of treatment plan with patient or surrogate, discussions with consultants, evaluation of patient's response to treatment, examination of patient, ordering and review of laboratory studies, ordering and review of radiographic studies, ordering and performing treatments and interventions, pulse oximetry, re-evaluation of patient's condition and review of old charts   I assumed direction of critical care for this patient from another provider in my specialty: no     Care discussed with: admitting provider      MEDICATIONS ORDERED IN ED: Medications  0.9 %  sodium chloride  infusion (has no administration in time range)  potassium chloride  SA (KLOR-CON  M) CR tablet 40 mEq (40 mEq Oral Given 03/02/24 1209)  morphine  (PF) 4 MG/ML injection 4 mg (4 mg Intravenous Given 03/02/24 1208)  ondansetron  (ZOFRAN ) injection 4 mg (4 mg Intravenous Given 03/02/24 1209)  iohexol  (OMNIPAQUE ) 300 MG/ML solution 100 mL (100 mLs Intravenous Contrast Given 03/02/24 1208)     IMPRESSION / MDM / ASSESSMENT AND PLAN / ED COURSE  I reviewed the triage vital signs and the nursing notes.                              68 y.o. female with past medical history of COPD on 6 L, diastolic CHF, pulmonary hypertension, CAD, atrial fibrillation on Xarelto , and chronic pain syndrome who presents to the ED for abnormal labs with low hemoglobin from her PCP.  Patient's presentation is most consistent with acute presentation with potential threat to life or bodily function.  Differential diagnosis includes, but is not limited to, anemia, iron  deficiency, GI bleed, intra-abdominal trauma, electrolyte abnormality, AKI.  Patient is nontoxic-appearing and in no acute distress, vital signs are unremarkable.  I  am unable to locate lab results from recent PCP visit showing no concerning labs, repeat hemoglobin pending at this time.  No obvious bleeding noted, although patient did have a recent fall onto the left side of her abdomen, has some ecchymosis to her skin in this area.  We will further assess with CT imaging to ensure no internal bleeding, additional labs are pending at this time.  Patient did not hit her head, do not feel imaging of her head and neck is indicated.  CT imaging is unremarkable, no evidence of internal bleeding or other traumatic injury.  Labs confirm anemia with  hemoglobin of 6.6, will transfuse 1 unit PRBCs.  Additional labs with mild hypokalemia, no significant AKI or LFT abnormality.  Rectal exam with guaiac negative stool, no clear source of bleeding at this time.  Given new onset anemia, case discussed with hospitalist for admission.      FINAL CLINICAL IMPRESSION(S) / ED DIAGNOSES   Final diagnoses:  Anemia, unspecified type     Rx / DC Orders   ED Discharge Orders     None        Note:  This document was prepared using Dragon voice recognition software and may include unintentional dictation errors.   Twilla Galea, MD 03/02/24 1245

## 2024-03-02 NOTE — ED Notes (Signed)
 RN transferred patient to the bed side commode. Patient denied feeling dizzy. Patient did state that she felt "a little" weak from sitting in the bed so long then standing. Patient needed supervision.

## 2024-03-03 DIAGNOSIS — K922 Gastrointestinal hemorrhage, unspecified: Secondary | ICD-10-CM | POA: Diagnosis not present

## 2024-03-03 DIAGNOSIS — J449 Chronic obstructive pulmonary disease, unspecified: Secondary | ICD-10-CM | POA: Diagnosis not present

## 2024-03-03 DIAGNOSIS — D509 Iron deficiency anemia, unspecified: Secondary | ICD-10-CM | POA: Diagnosis not present

## 2024-03-03 LAB — URINALYSIS, COMPLETE (UACMP) WITH MICROSCOPIC
Bilirubin Urine: NEGATIVE
Glucose, UA: 150 mg/dL — AB
Hgb urine dipstick: NEGATIVE
Ketones, ur: NEGATIVE mg/dL
Nitrite: NEGATIVE
Protein, ur: NEGATIVE mg/dL
Specific Gravity, Urine: 1.006 (ref 1.005–1.030)
pH: 5 (ref 5.0–8.0)

## 2024-03-03 LAB — BASIC METABOLIC PANEL WITH GFR
Anion gap: 8 (ref 5–15)
BUN: 12 mg/dL (ref 8–23)
CO2: 29 mmol/L (ref 22–32)
Calcium: 8.5 mg/dL — ABNORMAL LOW (ref 8.9–10.3)
Chloride: 103 mmol/L (ref 98–111)
Creatinine, Ser: 0.83 mg/dL (ref 0.44–1.00)
GFR, Estimated: >60 mL/min (ref >60)
Glucose, Bld: 89 mg/dL (ref 70–99)
Potassium: 3.3 mmol/L — ABNORMAL LOW (ref 3.5–5.1)
Sodium: 140 mmol/L (ref 135–145)

## 2024-03-03 LAB — CBC
HCT: 25.9 % — ABNORMAL LOW (ref 36.0–46.0)
Hemoglobin: 7.1 g/dL — ABNORMAL LOW (ref 12.0–15.0)
MCH: 20.9 pg — ABNORMAL LOW (ref 26.0–34.0)
MCHC: 27.4 g/dL — ABNORMAL LOW (ref 30.0–36.0)
MCV: 76.2 fL — ABNORMAL LOW (ref 80.0–100.0)
Platelets: 255 10*3/uL (ref 150–400)
RBC: 3.4 MIL/uL — ABNORMAL LOW (ref 3.87–5.11)
RDW: 20.9 % — ABNORMAL HIGH (ref 11.5–15.5)
WBC: 6.6 10*3/uL (ref 4.0–10.5)
nRBC: 0.3 % — ABNORMAL HIGH (ref 0.0–0.2)

## 2024-03-03 LAB — VITAMIN B12: Vitamin B-12: 500 pg/mL (ref 180–914)

## 2024-03-03 LAB — HIV ANTIBODY (ROUTINE TESTING W REFLEX): HIV Screen 4th Generation wRfx: NONREACTIVE

## 2024-03-03 MED ORDER — FERROUS SULFATE 325 (65 FE) MG PO TABS
325.0000 mg | ORAL_TABLET | Freq: Two times a day (BID) | ORAL | Status: DC
  Administered 2024-03-03 – 2024-03-05 (×5): 325 mg via ORAL
  Filled 2024-03-03 (×5): qty 1

## 2024-03-03 MED ORDER — DIPHENHYDRAMINE HCL 25 MG PO CAPS
25.0000 mg | ORAL_CAPSULE | Freq: Two times a day (BID) | ORAL | Status: DC | PRN
  Administered 2024-03-03 – 2024-03-06 (×4): 25 mg via ORAL
  Filled 2024-03-03 (×4): qty 1

## 2024-03-03 MED ORDER — HYDROCORTISONE 1 % EX CREA
TOPICAL_CREAM | Freq: Two times a day (BID) | CUTANEOUS | Status: DC | PRN
  Filled 2024-03-03: qty 28

## 2024-03-03 NOTE — Progress Notes (Signed)
 [facility]  - [place] [facility]   PATIENT NAME: Theresa [name]    MR#:  [id number]  DATE OF BIRTH:  June 11, 1956  SUBJECTIVE:  No family at bedside Came in with weakness and sob. Had hgb 6.6/ no BRBPR.  VITALS:  Blood pressure (!) 115/56, pulse 71, temperature 98.1 F (36.7 C), resp. rate 16, height 5\' 2"  (1.575 m), weight 96.1 kg, SpO2 100%.  PHYSICAL EXAMINATION:   GENERAL:  68 y.o.-year-old patient with no acute distress.  LUNGS: Normal breath sounds bilaterally CARDIOVASCULAR: S1, S2 normal. ABDOMEN: Soft, nontender, nondistended.  EXTREMITIES: No  edema b/l.    NEUROLOGIC: nonfocal  patient is alert and awake SKIN: bruises both hands  LABORATORY PANEL:  CBC Recent Labs  Lab 03/03/24 0427  WBC 6.6  HGB 7.1*  HCT 25.9*  PLT 255    Chemistries  Recent Labs  Lab 03/02/24 1100 03/02/24 1300 03/03/24 0427  NA 139  --  140  K 3.1*  --  3.3*  CL 101  --  103  CO2 31  --  29  GLUCOSE 104*  --  89  BUN 11  --  12  CREATININE 0.75  --  0.83  CALCIUM  9.0  --  8.5*  MG  --  2.8*  --   AST 30  --   --   ALT 34  --   --   ALKPHOS 91  --   --   BILITOT 0.5  --   --    Cardiac Enzymes No results for input(s): "TROPONINI" in the last 168 hours. RADIOLOGY:  DG Chest 1 View Result Date: 03/02/2024 CLINICAL DATA:  Congestive heart failure.  Dizziness. EXAM: CHEST  1 VIEW COMPARISON:  Radiograph 08/29/2023. Lung bases from abdominopelvic CT earlier today FINDINGS: The heart is mildly enlarged, stable. Unchanged peribronchial thickening. Subsegmental atelectasis in the lung bases. No significant pleural effusion. No pneumothorax. IMPRESSION: 1. Unchanged peribronchial thickening, bronchitic versus congestive. Mild cardiomegaly is unchanged. 2. Subsegmental atelectasis in the lung bases. Electronically Signed   By: Chadwick Colonel M.D.   On: 03/02/2024 13:06   CT ABDOMEN PELVIS W CONTRAST Result Date: 03/02/2024 CLINICAL DATA:  Blunt abdominal trauma  EXAM: CT ABDOMEN AND PELVIS WITH CONTRAST TECHNIQUE: Multidetector CT imaging of the abdomen and pelvis was performed using the standard protocol following bolus administration of intravenous contrast. RADIATION DOSE REDUCTION: This exam was performed according to the departmental dose-optimization program which includes automated exposure control, adjustment of the mA and/or kV according to patient size and/or use of iterative reconstruction technique. CONTRAST:  OMNIPAQUE  IOHEXOL  300 MG/ML  SOLN COMPARISON:  06/19/2022 FINDINGS: Lower chest: No acute pleural or parenchymal lung disease. Hepatobiliary: Scattered hepatic cysts are again noted. No other focal liver abnormalities. Gallbladder is unremarkable. No biliary duct dilation. Pancreas: Unremarkable. No pancreatic ductal dilatation or surrounding inflammatory changes. Spleen: Normal in size without focal abnormality. Adrenals/Urinary Tract: Stable appearance of the adrenals. Nonobstructing 6 mm calculus lower pole left kidney. Right kidney is unremarkable. Bladder is moderately distended without focal abnormality. Stomach/Bowel: No bowel obstruction or ileus. Normal appendix right lower quadrant. No bowel wall thickening or inflammatory change. Vascular/Lymphatic: Aortic atherosclerosis. No enlarged abdominal or pelvic lymph nodes. Reproductive: Uterus and bilateral adnexa are unremarkable. Other: No free fluid or free intraperitoneal gas. No abdominal wall hernia. Musculoskeletal: No acute or destructive bony abnormalities. Reconstructed images demonstrate no additional findings. IMPRESSION: 1. No acute intra-abdominal or intrapelvic trauma. 2. Nonobstructing 6 mm left renal calculus.  3.  Aortic Atherosclerosis (ICD10-I70.0). Electronically Signed   By: Bobbye Burrow M.D.   On: 03/02/2024 12:17    Assessment and Plan Theresa [name] is a 68 y.o. female with medical history significant of PAF on Xarelto , HTN/HFpEF, COPD Gold stage III, chronic  hypoxic respiratory failure on 4-5 L continuously, GERD, anxiety/depression, obesity, presented with worsening of general weakness, exertional dyspnea with intermittent abdominal pain.  She only takes oxycodone  for back pain, does not take NSAIDS or EtOH. No alcohol consumption.  CT abdomen pelvis with contrast showed no acute findings.  Blood work showed hemoglobin 6.6 compared to baseline 11.9 about 6 months ago.   Symptomatic anemia/IDA/Chronic anticoagulation H/o PUD (EGD 2023--non bleeding Duodenal ulcer and gastritis) - Clinically suspect new onset of upper GI bleed given the history of recent onset of abdominal pain.   --CT with contrast today did not show significant signs of active bleeding. -PPI twice daily --6.6--1 unit BT--7.2--7.1 --holding Xarelto  for now ---GI consult --Dr Antony Baumgartner aware --start Iron  pills --refer to Hematology for IV iron  as out pt--Dr Randy Buttery made aware for out pt f/u   COPD with chronic hypoxic respiratory failure - No acute concern, continue ICS and LABA - As needed albuterol  --chronic use of oxygen    HTN Chronic HFpEF - Euvolemic - Hold off home BP meds including spironolactone , Cardizem , and Lasix    PAF - In sinus rhythm - Continue amiodarone  - Hold off Xarelto    Obesity - Estimated BMI more than 35, calorie control recommended.  Procedures: Family communication :none Consults :GI CODE STATUS: full DVT Prophylaxis :scd Level of care: Telemetry Medical Status is: Inpatient Remains inpatient appropriate because: IDA w/u    TOTAL TIME TAKING CARE OF THIS PATIENT: 40 minutes.  >50% time spent on counselling and coordination of care  Note: This dictation was prepared with Dragon dictation along with smaller phrase technology. Any transcriptional errors that result from this process are unintentional.  Melvinia Stager M.D    [facility]   CC: Primary care physician; Tawnya Fava, Waddell Guess, MD

## 2024-03-03 NOTE — Consult Note (Signed)
 Luke Salaam , MD [address], [address], [place], [place], [address] [address], [address], [place], [place], [address] Phone: [phone]  Fax: [phone]  Consultation  Referring Provider:     No ref. provider found Primary Care Physician:  Tawnya Fava, Waddell Guess, MD Primary Gastroenterologist:  Dr. Corky Diener          Reason for Consultation:     iron  deficiency anemia   Date of Admission:  03/02/2024 Date of Consultation:  03/03/2024         HPI:   Theresa [name] is a 68 y.o. female is a patient who follows with Dr. Corky Diener in outpatient gastroenterology last seen in the office in April 2020 for.  At that point of time was seen for iron  deficiency anemia.  It was noted that the patient had repeated episodes of nosebleeds it had been occurring almost every day has been receiving iron  infusions through oncology she also had some abdominal discomfort and was placed on pantoprazole  the epistaxis had resolved as per his note in February 2024.  Prior cardiac evaluation had suggested moderate pulmonary hypertension.  The plan at that point of time was to return in 6 months to discuss colonoscopy as the patient was not ready for the same at that point of time.   The patient presented to the hospital with feeling short of breath and tired. She denies any black or red stool , no epistaxsis, no hematemesis or blood in urine. Denies any nsaid use. No abdominal pain . No other complaints, still feels short of breath on exertion.  She is on Xarelto , chronic respiratory failure on 4 to 5 L of oxygen  continuously, CKD.  Xarelto  has been held. Last dose was on Saturday .   03/02/2024 CT scan of the abdomen pelvis with contrast showed no acute abnormalities.  Hemoglobin on admission was 7.4 with an MCV of 76 6 months back hemoglobin of 11.9.  Ferritin of 11 B12 in process receiving blood   Past Medical History:  Diagnosis Date   Acute postoperative pain 10/03/2017   Anxiety    BiPAP (biphasic  positive airway pressure) dependence    at hs   Carpal tunnel syndrome    CHF (congestive heart failure) (HCC)    1/18   CHF (congestive heart failure) (HCC)    Chronic generalized abdominal pain    Chronic rhinitis    COPD (chronic obstructive pulmonary disease) (HCC)    DDD (degenerative disc disease), cervical    DDD (degenerative disc disease), lumbosacral    Depression    Dyspnea    Edema    Flu    1/18   Gastritis    GERD (gastroesophageal reflux disease)    Hematuria    Hernia of abdominal wall 07/17/2018   Hernia, abdominal    Hypertension    Kidney stones    Low back pain    Lumbar radiculitis    Malodorous urine    Muscle weakness    Obesity    Oxygen  dependent    Pneumonia due to COVID-19 virus 02/03/2020   Renal cyst    Sensory urge incontinence    Thyroid  activity decreased    9/19   Tobacco abuse    Wheezing     Past Surgical History:  Procedure Laterality Date   ABDOMINAL HYSTERECTOMY     CARDIOVERSION N/A 04/29/2022   Procedure: CARDIOVERSION;  Surgeon: Michelle Aid, MD;  Location: [facility] ORS;  Service: Cardiovascular;  Laterality: N/A;  CARDIOVERSION N/A 01/30/2024   Procedure: CARDIOVERSION;  Surgeon: Antonette Batters, MD;  Location: [facility] ORS;  Service: Cardiovascular;  Laterality: N/A;   CARDIOVERSION N/A 02/19/2024   Procedure: CARDIOVERSION;  Surgeon: Antonette Batters, MD;  Location: [facility] ORS;  Service: Cardiovascular;  Laterality: N/A;   CARPAL TUNNEL RELEASE Left 2012   cataract surgery Bilateral    EPIGASTRIC HERNIA REPAIR N/A 08/13/2018   HERNIA REPAIR EPIGASTRIC ADULT; polypropylene mesh reinforcement.  Surgeon: Marshall Skeeter, MD;  Location: [facility] ORS;  Service: General;  Laterality: N/A;   ESOPHAGOGASTRODUODENOSCOPY N/A 06/21/2022   Procedure: ESOPHAGOGASTRODUODENOSCOPY (EGD);  Surgeon: Toledo, Alphonsus Jeans, MD;  Location: [facility] ENDOSCOPY;  Service: Gastroenterology;  Laterality: N/A;   RIGHT/LEFT HEART CATH AND CORONARY ANGIOGRAPHY  N/A 07/11/2019   Procedure: RIGHT/LEFT HEART CATH AND CORONARY ANGIOGRAPHY;  Surgeon: Antonette Batters, MD;  Location: [facility] INVASIVE CV [facility];  Service: Cardiovascular;  Laterality: N/A;   TEE WITHOUT CARDIOVERSION N/A 04/29/2022   Procedure: TRANSESOPHAGEAL ECHOCARDIOGRAM (TEE);  Surgeon: Michelle Aid, MD;  Location: [facility] OR[facility];  Service: Cardiovascular;  Laterality: N/A;   TONSILLECTOMY     TUBAL LIGATION     UMBILICAL HERNIA REPAIR N/A 08/13/2018   HERNIA REPAIR UMBILICAL ADULT; polypropylene mesh reinforcement Surgeon: Marshall Skeeter, MD;  Location: [facility] OR[facility];  Service: General;  Laterality: N/A;    Prior to Admission medications   Medication Sig Start Date End Date Taking? Authorizing Provider  acetaminophen  (TYLENOL ) 500 MG tablet Take 1,000 mg by mouth every 8 (eight) hours as needed for mild pain or headache.   Yes [provider]  albuterol  (VENTOLIN  HFA) 108 (90 Base) MCG/ACT inhaler Inhale 2 puffs into the lungs every 6 (six) hours as needed for wheezing or shortness of breath. 10/23/19  Yes Gearline Kell, [place], MD  amiodarone  (PACERONE ) 200 MG tablet Take 1 tablet (200 mg total) by mouth daily. 04/30/22  Yes Donaciano Frizzle, MD  busPIRone  (BUSPAR ) 5 MG tablet Take 5 mg by mouth 2 (two) times daily.   Yes [provider]  Cholecalciferol  50 MCG (2000 UT) CAPS Take 2,000 Units by mouth daily.   Yes [provider]  dapagliflozin  propanediol (FARXIGA ) 10 MG TABS tablet Take 1 tablet (10 mg total) by mouth daily. 07/31/23  Yes Hackney, Tina A, FNP  diltiazem  (CARDIZEM  CD) 180 MG 24 hr capsule Take 180 mg by mouth daily.   Yes [provider]  Fluticasone -Umeclidin-Vilant (TRELEGY ELLIPTA) 100-62.5-25 MCG/ACT AEPB Inhale 1 puff into the lungs daily.   Yes [provider]  HYDROcodone -acetaminophen  (NORCO/VICODIN) 5-325 MG tablet Take 1 tablet by mouth every 8 (eight) hours as needed for severe pain (pain score 7-10). Must last 30 days 02/25/24  03/26/24 Yes Renaldo Caroli, MD  ipratropium-albuterol  (DUONEB) 0.5-2.5 (3) MG/3ML SOLN USE 1 VIAL VIA NEBULIZER EVERY 6 HOURS AS NEEDED FOR SHORTNESS OF BREATH OR WHEEZING 11/30/20  Yes Chrismon, Ortencia Blamer, PA-C  naloxone  (NARCAN ) nasal spray 4 mg/0.1 mL Place 1 spray into the nose as needed for up to 365 doses (for opioid-induced respiratory depresssion). In case of emergency (overdose), spray once into each nostril. If no response within 3 minutes, repeat application and call 911. 07/26/23 07/25/24 Yes Renaldo Caroli, MD  nystatin (MYCOSTATIN) 100000 UNIT/ML suspension Take 5 mLs by mouth 4 (four) times daily. 02/22/24  Yes [provider]  omeprazole  (PRILOSEC) 40 MG capsule Take 40 mg by mouth daily.   Yes [provider]  potassium chloride  SA (KLOR-CON  M) 20 MEQ tablet Take 2 tablets  every morning and 1 tablet every evening 06/22/22  Yes Hackney, Tina A, FNP  Rivaroxaban  (XARELTO ) 15 MG TABS tablet Take 15 mg by mouth daily with supper.   Yes [provider]  roflumilast  (DALIRESP ) 500 MCG TABS tablet Take 500 mcg by mouth daily.   Yes [provider]  sertraline  (ZOLOFT ) 100 MG tablet Take 2 tablets (200 mg total) by mouth daily. Please schedule office visit before any future refill. 03/28/23  Yes Lamon Pillow, MD  spironolactone  (ALDACTONE ) 25 MG tablet Take 0.5 tablets (12.5 mg total) by mouth daily. 08/07/23  Yes Hackney, Tina A, FNP  tirzepatide [facility]) 12.5 MG/0.5ML Pen Inject 12.5 mg into the skin once a week. 02/26/24  Yes Hackney, Tina A, FNP  torsemide  (DEMADEX ) 20 MG tablet Take 40-80 mg by mouth See admin instructions. Take 4 tablets (80mg ) by mouth every morning and take 2 tablets (40mg ) by mouth every afternoon   Yes [provider]  FUROSCIX  80 MG/10ML CTKT Inject 80 mg into the skin daily as needed (If gain over 3x's weight in fluid). 09/02/22   [provider]  HYDROcodone -acetaminophen  (NORCO/VICODIN) 5-325 MG tablet Take  1 tablet by mouth every 8 (eight) hours as needed for severe pain (pain score 7-10). Must last 30 days 01/26/24 02/25/24  Renaldo Caroli, MD  HYDROcodone -acetaminophen  (NORCO/VICODIN) 5-325 MG tablet Take 1 tablet by mouth every 8 (eight) hours as needed for severe pain (pain score 7-10). Must last 30 days 03/26/24 04/25/24  Renaldo Caroli, MD  OXYGEN  Inhale 5 L into the lungs continuous.    [provider]    Family History  Problem Relation Age of Onset   Heart disease Mother    Stroke Mother    Coronary artery disease Mother    Alcohol abuse Father    Heart disease Father    Lung cancer Sister    Cancer Sister    Breast cancer Sister    Cancer Brother    Cancer Brother    Kidney cancer Brother    Pneumonia Brother    Prostate cancer Neg Hx    Bladder Cancer Neg Hx      Social History   Tobacco Use   Smoking status: Former    Current packs/day: 1.50    Average packs/day: 1.5 packs/day for 44.0 years (66.0 ttl pk-yrs)    Types: Cigarettes   Smokeless tobacco: Never   Tobacco comments:    Stopped 4 weeks ago May 20, 2023  Vaping Use   Vaping status: Never Used  Substance Use Topics   Alcohol use: No   Drug use: Yes    Comment: prescribed hydrocodone     Allergies as of 03/02/2024 - Review Complete 03/02/2024  Allergen Reaction Noted   Gabapentin  Other (See Comments) 04/01/2022   Codeine Nausea And Vomiting 07/30/2013   Meloxicam  Other (See Comments) 05/05/2016    Review of Systems:    All systems reviewed and negative except where noted in HPI.   Physical Exam:  Vital signs in last 24 hours: Temp:  [97.7 F (36.5 C)-98.5 F (36.9 C)] 98.1 F (36.7 C) (05/18 0817) Pulse Rate:  [66-80] 71 (05/18 0817) Resp:  [16-22] 16 (05/18 0817) BP: (111-135)/(50-68) 115/56 (05/18 0817) SpO2:  [98 %-100 %] 100 % (05/18 0817) Weight:  [96.1 kg] 96.1 kg (05/17 1644) Last BM Date : 03/01/24 General:   Pleasant, cooperative in NAD Head:  Normocephalic and  atraumatic. Eyes:   No icterus.   Conjunctiva pink. PERRLA. Ears:  Normal auditory acuity. Neck:  Supple; no masses or thyroidomegaly Lungs: Respirations even and unlabored. Lungs clear to auscultation bilaterally.   No wheezes, crackles, or rhonchi.  Heart:  Regular rate and rhythm;  Without murmur, clicks, rubs or gallops Abdomen:  Soft, nondistended, nontender. Normal bowel sounds. No appreciable masses or hepatomegaly.  No rebound or guarding.  Neurologic:  Alert and oriented x3;  grossly normal neurologically. Psych:  Alert and cooperative. Normal affect.  LAB RESULTS: Recent Labs    03/02/24 1100 03/02/24 1729 03/02/24 2146 03/03/24 0427  WBC 6.7  --   --  6.6  HGB 6.6* 7.4* 7.6* 7.1*  HCT 25.4* 26.5* 27.8* 25.9*  PLT 291  --   --  255   BMET Recent Labs    03/02/24 1100 03/03/24 0427  NA 139 140  K 3.1* 3.3*  CL 101 103  CO2 31 29  GLUCOSE 104* 89  BUN 11 12  CREATININE 0.75 0.83  CALCIUM  9.0 8.5*   LFT Recent Labs    03/02/24 1100  PROT 7.0  ALBUMIN 3.7  AST 30  ALT 34  ALKPHOS 91  BILITOT 0.5   PT/INR Recent Labs    03/02/24 1100  LABPROT 16.5*  INR 1.3*    STUDIES: DG Chest 1 View Result Date: 03/02/2024 CLINICAL DATA:  Congestive heart failure.  Dizziness. EXAM: CHEST  1 VIEW COMPARISON:  Radiograph 08/29/2023. Lung bases from abdominopelvic CT earlier today FINDINGS: The heart is mildly enlarged, stable. Unchanged peribronchial thickening. Subsegmental atelectasis in the lung bases. No significant pleural effusion. No pneumothorax. IMPRESSION: 1. Unchanged peribronchial thickening, bronchitic versus congestive. Mild cardiomegaly is unchanged. 2. Subsegmental atelectasis in the lung bases. Electronically Signed   By: Chadwick Colonel M.D.   On: 03/02/2024 13:06   CT ABDOMEN PELVIS W CONTRAST Result Date: 03/02/2024 CLINICAL DATA:  Blunt abdominal trauma EXAM: CT ABDOMEN AND PELVIS WITH CONTRAST TECHNIQUE: Multidetector CT imaging of the abdomen  and pelvis was performed using the standard protocol following bolus administration of intravenous contrast. RADIATION DOSE REDUCTION: This exam was performed according to the departmental dose-optimization program which includes automated exposure control, adjustment of the mA and/or kV according to patient size and/or use of iterative reconstruction technique. CONTRAST:  OMNIPAQUE  IOHEXOL  300 MG/ML  SOLN COMPARISON:  06/19/2022 FINDINGS: Lower chest: No acute pleural or parenchymal lung disease. Hepatobiliary: Scattered hepatic cysts are again noted. No other focal liver abnormalities. Gallbladder is unremarkable. No biliary duct dilation. Pancreas: Unremarkable. No pancreatic ductal dilatation or surrounding inflammatory changes. Spleen: Normal in size without focal abnormality. Adrenals/Urinary Tract: Stable appearance of the adrenals. Nonobstructing 6 mm calculus lower pole left kidney. Right kidney is unremarkable. Bladder is moderately distended without focal abnormality. Stomach/Bowel: No bowel obstruction or ileus. Normal appendix right lower quadrant. No bowel wall thickening or inflammatory change. Vascular/Lymphatic: Aortic atherosclerosis. No enlarged abdominal or pelvic lymph nodes. Reproductive: Uterus and bilateral adnexa are unremarkable. Other: No free fluid or free intraperitoneal gas. No abdominal wall hernia. Musculoskeletal: No acute or destructive bony abnormalities. Reconstructed images demonstrate no additional findings. IMPRESSION: 1. No acute intra-abdominal or intrapelvic trauma. 2. Nonobstructing 6 mm left renal calculus. 3.  Aortic Atherosclerosis (ICD10-I70.0). Electronically Signed   By: Bobbye Burrow M.D.   On: 03/02/2024 12:17      Impression / Plan:   Theresa [name] is a 68 y.o. y/o female withMultiple comorbidities including pulmonary hypertension COPD chronic respiratory failure on oxygen  4 to 5 L/day known to have iron  deficiency in  the past but not completely  evaluated as patient was not ready for the procedures back when seen by Dr. Corky Diener last year.  She did not follow-up subsequently presents to the emergency room with shortness of breath.  There is been a drop in hemoglobin from a baseline of around 11 g to around 7 g and the anemia is consistent with severe iron  deficiency.  Ferritin is 11.  B12 is in process.  The patient has been on Xarelto  has been held.Last dose was yesterday  Impression: Severe iron  deficiency anemia with symptoms.  History of melena in the past and epistaxis.   Plan 1.  Monitor CBC transfuse as needed 2.  Check celiac serology urine analysis for blood loss 3.  Xarelto  has been held once it has been held for 2 days we can proceed with endoscopy evaluation which would be Tuesday (last dose Saturday ).  Before we proceed with endoscopy evaluation I would like her to be evaluated by cardiology due to history of pulmonary hypertension to risk stratify her risk for these procedures.  If there is any other optimization that can be performed before the procedure to reduce her risk it would be helpful as pulmonary hypertension increases the risk of anesthesia and complications.  Based on cardiac evaluation we can decide on timing of endoscopy procedures which would ideally require an EGD and colonoscopy and if negative capsule study of the small bowel. 4.  Suggest IV iron  as inpatient and follow-up further doses as an outpatient with hematology.  Thank you for involving me in the care of this patient.      LOS: 1 day   Luke Salaam, MD  03/03/2024, 9:26 AM

## 2024-03-04 DIAGNOSIS — D62 Acute posthemorrhagic anemia: Secondary | ICD-10-CM

## 2024-03-04 DIAGNOSIS — J449 Chronic obstructive pulmonary disease, unspecified: Secondary | ICD-10-CM | POA: Diagnosis not present

## 2024-03-04 DIAGNOSIS — K922 Gastrointestinal hemorrhage, unspecified: Secondary | ICD-10-CM | POA: Diagnosis not present

## 2024-03-04 DIAGNOSIS — D649 Anemia, unspecified: Secondary | ICD-10-CM | POA: Diagnosis not present

## 2024-03-04 DIAGNOSIS — D509 Iron deficiency anemia, unspecified: Secondary | ICD-10-CM | POA: Diagnosis not present

## 2024-03-04 LAB — HEMOGLOBIN: Hemoglobin: 6.9 g/dL — ABNORMAL LOW (ref 12.0–15.0)

## 2024-03-04 LAB — PREPARE RBC (CROSSMATCH)

## 2024-03-04 LAB — OCCULT BLOOD X 1 CARD TO LAB, STOOL: Fecal Occult Bld: NEGATIVE

## 2024-03-04 MED ORDER — SPIRONOLACTONE 12.5 MG HALF TABLET
12.5000 mg | ORAL_TABLET | Freq: Every day | ORAL | Status: DC
  Administered 2024-03-04 – 2024-03-07 (×4): 12.5 mg via ORAL
  Filled 2024-03-04 (×4): qty 1

## 2024-03-04 MED ORDER — DICLOFENAC SODIUM 1 % EX GEL
2.0000 g | Freq: Three times a day (TID) | CUTANEOUS | Status: AC
  Administered 2024-03-04 – 2024-03-05 (×6): 2 g via TOPICAL
  Filled 2024-03-04: qty 100

## 2024-03-04 MED ORDER — PEG 3350-KCL-NA BICARB-NACL 420 G PO SOLR
4000.0000 mL | Freq: Once | ORAL | Status: AC
  Administered 2024-03-04: 4000 mL via ORAL
  Filled 2024-03-04 (×2): qty 4000

## 2024-03-04 MED ORDER — SODIUM CHLORIDE 0.9% IV SOLUTION: Freq: Once | INTRAVENOUS | Status: AC

## 2024-03-04 MED ORDER — DILTIAZEM HCL ER COATED BEADS 180 MG PO CP24
180.0000 mg | ORAL_CAPSULE | Freq: Every day | ORAL | Status: DC
  Administered 2024-03-04 – 2024-03-07 (×4): 180 mg via ORAL
  Filled 2024-03-04 (×4): qty 1

## 2024-03-04 MED ORDER — DAPAGLIFLOZIN PROPANEDIOL 10 MG PO TABS
10.0000 mg | ORAL_TABLET | Freq: Every day | ORAL | Status: DC
  Administered 2024-03-04 – 2024-03-07 (×4): 10 mg via ORAL
  Filled 2024-03-04 (×4): qty 1

## 2024-03-04 NOTE — Plan of Care (Signed)
 Care plan

## 2024-03-04 NOTE — Progress Notes (Signed)
 [facility]  - [place] [facility]   PATIENT NAME: Theresa [name]    MR#:  [id number]  DATE OF BIRTH:  1956/01/26  SUBJECTIVE:  Husband at bedside C/o right scapular are pain on palpation Hgb 6.9 today--getting 2nd unit of BT  VITALS:  Blood pressure 133/69, pulse 74, temperature 98.2 F (36.8 C), temperature source Oral, resp. rate 17, height 5\' 2"  (1.575 m), weight 96.1 kg, SpO2 100%.  PHYSICAL EXAMINATION:   GENERAL:  68 y.o.-year-old patient with no acute distress.  LUNGS: decreased breath sounds bilaterally CARDIOVASCULAR: S1, S2 normal. ABDOMEN: Soft, nontender, nondistended.  EXTREMITIES: +  edema b/l.    NEUROLOGIC: nonfocal  patient is alert and awake SKIN: bruises both hands  LABORATORY PANEL:  CBC Recent Labs  Lab 03/03/24 0427 03/04/24 0211  WBC 6.6  --   HGB 7.1* 6.9*  HCT 25.9*  --   PLT 255  --     Chemistries  Recent Labs  Lab 03/02/24 1100 03/02/24 1300 03/03/24 0427  NA 139  --  140  K 3.1*  --  3.3*  CL 101  --  103  CO2 31  --  29  GLUCOSE 104*  --  89  BUN 11  --  12  CREATININE 0.75  --  0.83  CALCIUM  9.0  --  8.5*  MG  --  2.8*  --   AST 30  --   --   ALT 34  --   --   ALKPHOS 91  --   --   BILITOT 0.5  --   --    Cardiac Enzymes No results for input(s): "TROPONINI" in the last 168 hours. RADIOLOGY:  DG Chest 1 View Result Date: 03/02/2024 CLINICAL DATA:  Congestive heart failure.  Dizziness. EXAM: CHEST  1 VIEW COMPARISON:  Radiograph 08/29/2023. Lung bases from abdominopelvic CT earlier today FINDINGS: The heart is mildly enlarged, stable. Unchanged peribronchial thickening. Subsegmental atelectasis in the lung bases. No significant pleural effusion. No pneumothorax. IMPRESSION: 1. Unchanged peribronchial thickening, bronchitic versus congestive. Mild cardiomegaly is unchanged. 2. Subsegmental atelectasis in the lung bases. Electronically Signed   By: Chadwick Colonel M.D.   On: 03/02/2024 13:06   CT ABDOMEN  PELVIS W CONTRAST Result Date: 03/02/2024 CLINICAL DATA:  Blunt abdominal trauma EXAM: CT ABDOMEN AND PELVIS WITH CONTRAST TECHNIQUE: Multidetector CT imaging of the abdomen and pelvis was performed using the standard protocol following bolus administration of intravenous contrast. RADIATION DOSE REDUCTION: This exam was performed according to the departmental dose-optimization program which includes automated exposure control, adjustment of the mA and/or kV according to patient size and/or use of iterative reconstruction technique. CONTRAST:  OMNIPAQUE  IOHEXOL  300 MG/ML  SOLN COMPARISON:  06/19/2022 FINDINGS: Lower chest: No acute pleural or parenchymal lung disease. Hepatobiliary: Scattered hepatic cysts are again noted. No other focal liver abnormalities. Gallbladder is unremarkable. No biliary duct dilation. Pancreas: Unremarkable. No pancreatic ductal dilatation or surrounding inflammatory changes. Spleen: Normal in size without focal abnormality. Adrenals/Urinary Tract: Stable appearance of the adrenals. Nonobstructing 6 mm calculus lower pole left kidney. Right kidney is unremarkable. Bladder is moderately distended without focal abnormality. Stomach/Bowel: No bowel obstruction or ileus. Normal appendix right lower quadrant. No bowel wall thickening or inflammatory change. Vascular/Lymphatic: Aortic atherosclerosis. No enlarged abdominal or pelvic lymph nodes. Reproductive: Uterus and bilateral adnexa are unremarkable. Other: No free fluid or free intraperitoneal gas. No abdominal wall hernia. Musculoskeletal: No acute or destructive bony abnormalities. Reconstructed images demonstrate no additional  findings. IMPRESSION: 1. No acute intra-abdominal or intrapelvic trauma. 2. Nonobstructing 6 mm left renal calculus. 3.  Aortic Atherosclerosis (ICD10-I70.0). Electronically Signed   By: Bobbye Burrow M.D.   On: 03/02/2024 12:17    Assessment and Plan Theresa [name] is a 68 y.o. female with medical  history significant of PAF on Xarelto , HTN/HFpEF, COPD Gold stage III, chronic hypoxic respiratory failure on 4-5 L continuously, GERD, anxiety/depression, obesity, presented with worsening of general weakness, exertional dyspnea with intermittent abdominal pain.  She only takes oxycodone  for back pain, does not take NSAIDS or EtOH. No alcohol consumption.  CT abdomen pelvis with contrast showed no acute findings.  Blood work showed hemoglobin 6.6 compared to baseline 11.9 about 6 months ago.   Symptomatic anemia/IDA/Chronic anticoagulation H/o PUD (EGD 2023--non bleeding Duodenal ulcer and gastritis) - Clinically suspect new onset of upper GI bleed given the history of recent onset of abdominal pain.   --CT with contrast today did not show significant signs of active bleeding. -PPI twice daily --6.6--1 unit BT--7.2--7.1--6.9--2nd uit --holding Xarelto  for now ---GI consult --Dr Antony Baumgartner aware--Dr Ole Berkeley to do EGD/Colonoscopy in am --start Iron  pills --refer to Hematology for IV iron  as out pt--Dr Randy Buttery made aware for out pt f/u --Pre-op Cardiology clearance obtained from Dr Beau Bound.  --Cardiology recs to HOLD xarelto  at discharge given high risk of bleeding for now   COPD with chronic hypoxic respiratory failure - No acute concern, continue ICS and LABA - As needed albuterol  --chronic use of oxygen    HTN Chronic HFpEF - Euvolemic - Hold off home BP meds including spironolactone , Cardizem , and Lasix    PAF - Continue amiodarone  - Hold off Xarelto --at discharge also --pt is s/p DCCV on 02/19/24--in SR at present   Obesity - Estimated BMI more than 35, calorie control recommended.  Procedures: Family communication husband Consults :GI, [facility] cardiology    CODE STATUS: full DVT Prophylaxis :scd Level of care: Telemetry Medical Status is: Inpatient Remains inpatient appropriate because: IDA w/u    TOTAL TIME TAKING CARE OF THIS PATIENT: 40 minutes.  >50% time spent on counselling and  coordination of care  Note: This dictation was prepared with Dragon dictation along with smaller phrase technology. Any transcriptional errors that result from this process are unintentional.  Theresa [name] M.D    [facility]   CC: Primary care physician; Tawnya Fava, Waddell Guess, MD

## 2024-03-04 NOTE — Progress Notes (Signed)
 Theresa Sink, MD [facility]   [address], [address] [place], [place] [address] Phone: [phone] Fax : [phone]  Subjective: This patient has a history of iron  deficiency anemia with a history of nosebleeds and has been receiving iron  infusions through oncology.  The patient also has a history of pulmonary hypertension. The patient was admitted with shortness of breath and feeling tired but denied any black stools or bloody stools or any other signs of bleeding.  The patient's labs have shown:  Component     Latest Ref Rng 08/29/2023 03/02/2024 03/03/2024 03/04/2024  Hemoglobin     12.0 - 15.0 g/dL 43.3 (L)  7.6 (L)  7.1 (L)  6.9 (L)   Hemoglobin       7.4 (L)     Hemoglobin       6.6 (L)       Objective: Vital signs in last 24 hours: Temp:  [97.6 F (36.4 C)-98.3 F (36.8 C)] 98.2 F (36.8 C) (05/19 1001) Pulse Rate:  [64-74] 74 (05/19 1001) Resp:  [16-20] 17 (05/19 1001) BP: (105-133)/(53-77) 133/69 (05/19 1001) SpO2:  [100 %] 100 % (05/19 1001) Last BM Date : 03/01/24 No LMP recorded. Patient has had a hysterectomy. Body mass index is 38.75 kg/m. General:   Alert,  Well-developed, well-nourished, pleasant and cooperative in NAD Head:  Normocephalic and atraumatic. Eyes:  Sclera clear, no icterus.   Conjunctiva pink. Mouth:  No deformity or lesions, oropharynx pink & moist. Neck:  Supple; no masses or thyromegaly. Heart:  Regular rate and rhythm; no murmurs, clicks, rubs,  or gallops. Abdomen:   Normal bowel sounds.  Soft, nontender and nondistended. No masses, hepatosplenomegaly or hernias noted.  No guarding or rebound tenderness.   Msk:  Symmetrical without gross deformities. Normal posture. Pulses:  Normal pulses noted. Extremities:  Without clubbing or edema. Neurologic:  Alert and  oriented x4;  grossly normal neurologically. Skin:  Intact without significant lesions or rashes. Cervical Nodes:  No significant cervical adenopathy. Psych:  Alert and cooperative.  Normal mood and affect.  Intake/Output from previous day: 05/18 0701 - 05/19 0700 In: 800 [P.O.:800] Out: -   Lab Results: Recent Labs    03/02/24 1100 03/02/24 1729 03/02/24 2146 03/03/24 0427 03/04/24 0211  WBC 6.7  --   --  6.6  --   HGB 6.6* 7.4* 7.6* 7.1* 6.9*  HCT 25.4* 26.5* 27.8* 25.9*  --   PLT 291  --   --  255  --    BMET Recent Labs    03/02/24 1100 03/03/24 0427  NA 139 140  K 3.1* 3.3*  CL 101 103  CO2 31 29  GLUCOSE 104* 89  BUN 11 12  CREATININE 0.75 0.83  CALCIUM  9.0 8.5*   LFT Recent Labs    03/02/24 1100  PROT 7.0  ALBUMIN 3.7  AST 30  ALT 34  ALKPHOS 91  BILITOT 0.5   PT/INR Recent Labs    03/02/24 1100  LABPROT 16.5*  INR 1.3*   Hepatitis Panel No results for input(s): "HEPBSAG", "HCVAB", "HEPAIGM", "HEPBIGM" in the last 72 hours.   Studies/Results: DG Chest 1 View Result Date: 03/02/2024 CLINICAL DATA:  Congestive heart failure.  Dizziness. EXAM: CHEST  1 VIEW COMPARISON:  Radiograph 08/29/2023. Lung bases from abdominopelvic CT earlier today FINDINGS: The heart is mildly enlarged, stable. Unchanged peribronchial thickening. Subsegmental atelectasis in the lung bases. No significant pleural effusion. No pneumothorax. IMPRESSION: 1. Unchanged peribronchial thickening, bronchitic versus congestive. Mild cardiomegaly is  unchanged. 2. Subsegmental atelectasis in the lung bases. Electronically Signed   By: Chadwick Colonel M.D.   On: 03/02/2024 13:06    Assessment: Principal Problem:   GI bleed Active Problems:   Symptomatic anemia   Anemia due to chronic blood loss   Iron  deficiency anemia   This patient came in on Xarelto  with a finding of anemia.  The patient denies seeing any bright red blood or any dark stools.    Plan: The patient will be set up for an EGD and colonoscopy to be done tomorrow.  The patient is on a clear liquid diet and will be kept n.p.o. after midnight for the procedures.  The patient will also be given  a prep for her colonoscopy tomorrow.  The patient has been explained the plan and agrees with it.    LOS: 2 days   Theresa [name]  03/04/2024, 12:23 PM   Note: This dictation was prepared with Dragon dictation along with smaller phrase technology. Any transcriptional errors that result from this process are unintentional.

## 2024-03-04 NOTE — Consult Note (Addendum)
 [facility] CLINIC CARDIOLOGY CONSULT NOTE       Patient ID: Theresa [name] MRN: [id number] DOB/AGE: [birth year] 67 y.o.  Admit date: 03/02/2024 Referring Physician Melvinia Stager Primary Physician Tawnya Fava, Waddell Guess, MD Primary Cardiologist Dr. Beau Bound Reason for Consultation POC  HPI: Theresa [name] is a 68 y.o. female  with a past medical history of chronic diastolic heart failure, severe tricuspid regurgitation, pulmonary hypertension, atrial fibrillation (on Xarelto ) s/p DCCV (02/19/24), COPD, chronic respiratory failure with hypoxia (6L at home), hx epistasis, hx frequent falls who presented to the ED on 03/02/2024 after being sent by PCP with abnormal labs. Patient scheduled for Endoscopy/colonoscopy tomorrow and required cardiac evaluation. Cardiology was consulted for further evaluation.   Patient presented to the ED on 05/17 due to low blood count. Patient denies any bloody stools, hematuria or nosebleeds. Patient states she's been feeling tired, but denies any chest pain or SOB. Work up in the ED notable for Na 139, K 3.1, Cr 0.75, Hgb 6.6, plts 291, iron  18, TIBC 501. Ct abdomen and pelvis with no acute intraabdominal abnormality, evidence of nonobstructive 6 mm left renal calculus, aortic atherosclerosis. CXR with mild cardiomegaly (unchanged), and atelectasis.Patient received 1 unit PRBCs, started on iron , and holding Xarelto  due to lowe blood count.  At the time of my evaluation this morning, patient was resting comfortably in hospital bed in no acute distress. Patient reports feeling well this AM. Patient states she felt her heart racing for 20 minutes while laying in bed that spontaneously resolved. Patient denies any associated chest pain or SOB. Patient states this is the first time she felt palpitations since her cardioversion (02/19/24).This AM patient denies any chest pain, SOB or palpitations.   Review of systems complete and found to be negative unless listed above     Past Medical History:  Diagnosis Date   Acute postoperative pain 10/03/2017   Anxiety    BiPAP (biphasic positive airway pressure) dependence    at hs   Carpal tunnel syndrome    CHF (congestive heart failure) (HCC)    1/18   CHF (congestive heart failure) (HCC)    Chronic generalized abdominal pain    Chronic rhinitis    COPD (chronic obstructive pulmonary disease) (HCC)    DDD (degenerative disc disease), cervical    DDD (degenerative disc disease), lumbosacral    Depression    Dyspnea    Edema    Flu    1/18   Gastritis    GERD (gastroesophageal reflux disease)    Hematuria    Hernia of abdominal wall 07/17/2018   Hernia, abdominal    Hypertension    Kidney stones    Low back pain    Lumbar radiculitis    Malodorous urine    Muscle weakness    Obesity    Oxygen  dependent    Pneumonia due to COVID-19 virus 02/03/2020   Renal cyst    Sensory urge incontinence    Thyroid  activity decreased    9/19   Tobacco abuse    Wheezing     Past Surgical History:  Procedure Laterality Date   ABDOMINAL HYSTERECTOMY     CARDIOVERSION N/A 04/29/2022   Procedure: CARDIOVERSION;  Surgeon: Michelle Aid, MD;  Location: [facility] ORS;  Service: Cardiovascular;  Laterality: N/A;   CARDIOVERSION N/A 01/30/2024   Procedure: CARDIOVERSION;  Surgeon: Antonette Batters, MD;  Location: [facility] ORS;  Service: Cardiovascular;  Laterality: N/A;   CARDIOVERSION N/A 02/19/2024   Procedure: CARDIOVERSION;  Surgeon: Antonette Batters, MD;  Location: [facility] ORS;  Service: Cardiovascular;  Laterality: N/A;   CARPAL TUNNEL RELEASE Left 2012   cataract surgery Bilateral    EPIGASTRIC HERNIA REPAIR N/A 08/13/2018   HERNIA REPAIR EPIGASTRIC ADULT; polypropylene mesh reinforcement.  Surgeon: Marshall Skeeter, MD;  Location: [facility] ORS;  Service: General;  Laterality: N/A;   ESOPHAGOGASTRODUODENOSCOPY N/A 06/21/2022   Procedure: ESOPHAGOGASTRODUODENOSCOPY (EGD);  Surgeon: Toledo, Alphonsus Jeans, MD;   Location: [facility] ENDOSCOPY;  Service: Gastroenterology;  Laterality: N/A;   RIGHT/LEFT HEART CATH AND CORONARY ANGIOGRAPHY N/A 07/11/2019   Procedure: RIGHT/LEFT HEART CATH AND CORONARY ANGIOGRAPHY;  Surgeon: Antonette Batters, MD;  Location: [facility] INVASIVE CV LAB;  Service: Cardiovascular;  Laterality: N/A;   TEE WITHOUT CARDIOVERSION N/A 04/29/2022   Procedure: TRANSESOPHAGEAL ECHOCARDIOGRAM (TEE);  Surgeon: Michelle Aid, MD;  Location: [facility] ORS;  Service: Cardiovascular;  Laterality: N/A;   TONSILLECTOMY     TUBAL LIGATION     UMBILICAL HERNIA REPAIR N/A 08/13/2018   HERNIA REPAIR UMBILICAL ADULT; polypropylene mesh reinforcement Surgeon: Marshall Skeeter, MD;  Location: [facility] ORS;  Service: General;  Laterality: N/A;    Medications Prior to Admission  Medication Sig Dispense Refill Last Dose/Taking   acetaminophen  (TYLENOL ) 500 MG tablet Take 1,000 mg by mouth every 8 (eight) hours as needed for mild pain or headache.   Past Month   albuterol  (VENTOLIN  HFA) 108 (90 Base) MCG/ACT inhaler Inhale 2 puffs into the lungs every 6 (six) hours as needed for wheezing or shortness of breath. 8 g 1 03/01/2024   amiodarone  (PACERONE ) 200 MG tablet Take 1 tablet (200 mg total) by mouth daily. 30 tablet 0 03/02/2024 Morning   busPIRone  (BUSPAR ) 5 MG tablet Take 5 mg by mouth 2 (two) times daily.   03/02/2024 Morning   Cholecalciferol  50 MCG (2000 UT) CAPS Take 2,000 Units by mouth daily.   Past Month   dapagliflozin  propanediol (FARXIGA ) 10 MG TABS tablet Take 1 tablet (10 mg total) by mouth daily. 90 tablet 3 03/02/2024 Morning   diltiazem  (CARDIZEM  CD) 180 MG 24 hr capsule Take 180 mg by mouth daily.   03/02/2024 Morning   Fluticasone -Umeclidin-Vilant (TRELEGY ELLIPTA) 100-62.5-25 MCG/ACT AEPB Inhale 1 puff into the lungs daily.   03/02/2024 Morning   HYDROcodone -acetaminophen  (NORCO/VICODIN) 5-325 MG tablet Take 1 tablet by mouth every 8 (eight) hours as needed for severe pain (pain score 7-10). Must last  30 days 90 tablet 0 03/02/2024 Morning   ipratropium-albuterol  (DUONEB) 0.5-2.5 (3) MG/3ML SOLN USE 1 VIAL VIA NEBULIZER EVERY 6 HOURS AS NEEDED FOR SHORTNESS OF BREATH OR WHEEZING 1080 mL 0 Past Month   naloxone  (NARCAN ) nasal spray 4 mg/0.1 mL Place 1 spray into the nose as needed for up to 365 doses (for opioid-induced respiratory depresssion). In case of emergency (overdose), spray once into each nostril. If no response within 3 minutes, repeat application and call 911. 1 each 0 Taking As Needed   nystatin  (MYCOSTATIN ) 100000 UNIT/ML suspension Take 5 mLs by mouth 4 (four) times daily.   Past Week   omeprazole  (PRILOSEC) 40 MG capsule Take 40 mg by mouth daily.   03/02/2024 Morning   potassium chloride  SA (KLOR-CON  M) 20 MEQ tablet Take 2 tablets every morning and 1 tablet every evening 270 tablet 3 03/02/2024 Morning   Rivaroxaban  (XARELTO ) 15 MG TABS tablet Take 15 mg by mouth daily with supper.   03/02/2024 Morning   roflumilast  (DALIRESP ) 500 MCG TABS tablet Take 500 mcg by mouth  daily.   03/02/2024 Morning   sertraline  (ZOLOFT ) 100 MG tablet Take 2 tablets (200 mg total) by mouth daily. Please schedule office visit before any future refill. 60 tablet 0 03/02/2024 Morning   spironolactone  (ALDACTONE ) 25 MG tablet Take 0.5 tablets (12.5 mg total) by mouth daily. 45 tablet 3 03/02/2024 Morning   tirzepatide (MOUNJARO) 12.5 MG/0.5ML Pen Inject 12.5 mg into the skin once a week. 2 mL 0 02/29/2024   torsemide  (DEMADEX ) 20 MG tablet Take 40-80 mg by mouth See admin instructions. Take 4 tablets (80mg ) by mouth every morning and take 2 tablets (40mg ) by mouth every afternoon   03/02/2024 Morning   FUROSCIX  80 MG/10ML CTKT Inject 80 mg into the skin daily as needed (If gain over 3x's weight in fluid).      HYDROcodone -acetaminophen  (NORCO/VICODIN) 5-325 MG tablet Take 1 tablet by mouth every 8 (eight) hours as needed for severe pain (pain score 7-10). Must last 30 days 90 tablet 0    [START ON 03/26/2024]  HYDROcodone -acetaminophen  (NORCO/VICODIN) 5-325 MG tablet Take 1 tablet by mouth every 8 (eight) hours as needed for severe pain (pain score 7-10). Must last 30 days 90 tablet 0    OXYGEN  Inhale 5 L into the lungs continuous.      Social History   Socioeconomic History   Marital status: Married    Spouse name: Elana Grayer   Number of children: 2   Years of education: Not on file   Highest education level: 12th grade  Occupational History   Occupation: disability  Tobacco Use   Smoking status: Former    Current packs/day: 1.50    Average packs/day: 1.5 packs/day for 44.0 years (66.0 ttl pk-yrs)    Types: Cigarettes   Smokeless tobacco: Never   Tobacco comments:    Stopped 4 weeks ago May 20, 2023  Vaping Use   Vaping status: Never Used  Substance and Sexual Activity   Alcohol use: No   Drug use: Yes    Comment: prescribed hydrocodone    Sexual activity: Not Currently    Birth control/protection: Surgical  Other Topics Concern   Not on file  Social History Narrative   ** Merged History Encounter **       Lives with spouse    Social Drivers of Health   Financial Resource Strain: Medium Risk (02/26/2024)   Received from [facility]   Overall Financial Resource Strain (CARDIA)    Difficulty of Paying Living Expenses: Somewhat hard  Food Insecurity: No Food Insecurity (03/02/2024)   Hunger Vital Sign    Worried About Running Out of Food in the Last Year: Never true    Ran Out of Food in the Last Year: Never true  Recent Concern: Food Insecurity - Food Insecurity Present (02/26/2024)   Received from [facility]   Hunger Vital Sign    Worried About Running Out of Food in the Last Year: Sometimes true    Ran Out of Food in the Last Year: Often true  Transportation Needs: No Transportation Needs (03/02/2024)   PRAPARE - Administrator, Civil Service (Medical): No    Lack of Transportation (Non-Medical): No  Physical Activity:  Inactive (08/24/2022)   Received from [facility], [facility]   Exercise Vital Sign    Days of Exercise per Week: 0 days    Minutes of Exercise per Session: 0 min  Stress: Stress Concern Present (08/24/2022)   Received from [facility], [facility]   [employer] of Occupational Health - Occupational Stress Questionnaire    Feeling of Stress : Very much  Social Connections: Moderately Integrated (03/02/2024)   Social Connection and Isolation Panel [NHANES]    Frequency of Communication with Friends and Family: More than three times a week    Frequency of Social Gatherings with Friends and Family: Once a week    Attends Religious Services: More than 4 times per year    Active Member of Clubs or Organizations: No    Attends Banker Meetings: Never    Marital Status: Married  Catering manager Violence: Not At Risk (03/02/2024)   Humiliation, Afraid, Rape, and Kick questionnaire    Fear of Current or Ex-Partner: No    Emotionally Abused: No    Physically Abused: No    Sexually Abused: No    Family History  Problem Relation Age of Onset   Heart disease Mother    Stroke Mother    Coronary artery disease Mother    Alcohol abuse Father    Heart disease Father    Lung cancer Sister    Cancer Sister    Breast cancer Sister    Cancer Brother    Cancer Brother    Kidney cancer Brother    Pneumonia Brother    Prostate cancer Neg Hx    Bladder Cancer Neg Hx      Vitals:   03/03/24 0817 03/03/24 1528 03/03/24 2027 03/04/24 0432  BP: (!) 115/56 (!) 125/53 (!) 114/55 105/61  Pulse: 71 69 66 64  Resp: 16 16 20 18   Temp: 98.1 F (36.7 C) 98.3 F (36.8 C) 97.8 F (36.6 C) 97.9 F (36.6 C)  TempSrc:  Oral Oral   SpO2: 100% 100% 100% 100%  Weight:      Height:        PHYSICAL EXAM General: chronically ill appearing elderly female, well nourished, in no acute distress. HEENT: Normocephalic and  atraumatic. Neck: No JVD.   Lungs: Normal respiratory effort on 6L. Clear bilaterally to auscultation. No wheezes, crackles, rhonchi.  Heart: HRRR. Normal S1 and S2 without gallops or murmurs.  Abdomen: Non-distended appearing.  Msk: Normal strength and tone for age. Extremities: Warm and well perfused. No clubbing, cyanosis. No edema.  Neuro: Alert and oriented X 3. Psych: Answers questions appropriately.   Labs: Basic Metabolic Panel: Recent Labs    03/02/24 1100 03/02/24 1300 03/03/24 0427  NA 139  --  140  K 3.1*  --  3.3*  CL 101  --  103  CO2 31  --  29  GLUCOSE 104*  --  89  BUN 11  --  12  CREATININE 0.75  --  0.83  CALCIUM  9.0  --  8.5*  MG  --  2.8*  --    Liver Function Tests: Recent Labs    03/02/24 1100  AST 30  ALT 34  ALKPHOS 91  BILITOT 0.5  PROT 7.0  ALBUMIN 3.7   No results for input(s): "LIPASE", "AMYLASE" in the last 72 hours. CBC: Recent Labs    03/02/24 1100 03/02/24 1729 03/02/24 2146 03/03/24 0427 03/04/24 0211  WBC 6.7  --   --  6.6  --   NEUTROABS 5.3  --   --   --   --   HGB 6.6*   < > 7.6* 7.1* 6.9*  HCT 25.4*   < > 27.8* 25.9*  --   MCV 76.0*  --   --  76.2*  --   PLT 291  --   --  255  --    < > = values in this interval not displayed.   Cardiac Enzymes: No results for input(s): "CKTOTAL", "CKMB", "CKMBINDEX", "TROPONINIHS" in the last 72 hours. BNP: No results for input(s): "BNP" in the last 72 hours. D-Dimer: No results for input(s): "DDIMER" in the last 72 hours. Hemoglobin A1C: No results for input(s): "HGBA1C" in the last 72 hours. Fasting Lipid Panel: No results for input(s): "CHOL", "HDL", "LDLCALC", "TRIG", "CHOLHDL", "LDLDIRECT" in the last 72 hours. Thyroid  Function Tests: No results for input(s): "TSH", "T4TOTAL", "T3FREE", "THYROIDAB" in the last 72 hours.  Invalid input(s): "FREET3" Anemia Panel: Recent Labs    03/02/24 1100 03/02/24 1102  VITAMINB12  --  500  FERRITIN 11  --   TIBC 501*  --   IRON   18*  --   RETICCTPCT 1.8  --      Radiology: DG Chest 1 View Result Date: 03/02/2024 CLINICAL DATA:  Congestive heart failure.  Dizziness. EXAM: CHEST  1 VIEW COMPARISON:  Radiograph 08/29/2023. Lung bases from abdominopelvic CT earlier today FINDINGS: The heart is mildly enlarged, stable. Unchanged peribronchial thickening. Subsegmental atelectasis in the lung bases. No significant pleural effusion. No pneumothorax. IMPRESSION: 1. Unchanged peribronchial thickening, bronchitic versus congestive. Mild cardiomegaly is unchanged. 2. Subsegmental atelectasis in the lung bases. Electronically Signed   By: Chadwick Colonel M.D.   On: 03/02/2024 13:06   CT ABDOMEN PELVIS W CONTRAST Result Date: 03/02/2024 CLINICAL DATA:  Blunt abdominal trauma EXAM: CT ABDOMEN AND PELVIS WITH CONTRAST TECHNIQUE: Multidetector CT imaging of the abdomen and pelvis was performed using the standard protocol following bolus administration of intravenous contrast. RADIATION DOSE REDUCTION: This exam was performed according to the departmental dose-optimization program which includes automated exposure control, adjustment of the mA and/or kV according to patient size and/or use of iterative reconstruction technique. CONTRAST:  OMNIPAQUE  IOHEXOL  300 MG/ML  SOLN COMPARISON:  06/19/2022 FINDINGS: Lower chest: No acute pleural or parenchymal lung disease. Hepatobiliary: Scattered hepatic cysts are again noted. No other focal liver abnormalities. Gallbladder is unremarkable. No biliary duct dilation. Pancreas: Unremarkable. No pancreatic ductal dilatation or surrounding inflammatory changes. Spleen: Normal in size without focal abnormality. Adrenals/Urinary Tract: Stable appearance of the adrenals. Nonobstructing 6 mm calculus lower pole left kidney. Right kidney is unremarkable. Bladder is moderately distended without focal abnormality. Stomach/Bowel: No bowel obstruction or ileus. Normal appendix right lower quadrant. No bowel wall  thickening or inflammatory change. Vascular/Lymphatic: Aortic atherosclerosis. No enlarged abdominal or pelvic lymph nodes. Reproductive: Uterus and bilateral adnexa are unremarkable. Other: No free fluid or free intraperitoneal gas. No abdominal wall hernia. Musculoskeletal: No acute or destructive bony abnormalities. Reconstructed images demonstrate no additional findings. IMPRESSION: 1. No acute intra-abdominal or intrapelvic trauma. 2. Nonobstructing 6 mm left renal calculus. 3.  Aortic Atherosclerosis (ICD10-I70.0). Electronically Signed   By: Bobbye Burrow M.D.   On: 03/02/2024 12:17    ECHO 02/19/24 NORMAL LEFT VENTRICULAR SYSTOLIC FUNCTION WITH NO LVH  ESTIMATED EF: >55%  NORMAL LA PRESSURES WITH NORMAL DIASTOLIC FUNCTION  NORMAL RIGHT VENTRICULAR SYSTOLIC FUNCTION  VALVULAR REGURGITATION: MILD AR, MILD MR, TRIVIAL PR, MODERATE TR  NO VALVULAR STENOSIS   TELEMETRY reviewed by me 03/04/2024: not on tele  EKG reviewed by me: sinus rhythm, rate 72 bpm  Data reviewed by me 03/04/2024: last 24h vitals tele labs imaging I/O ED provider note, admission H&P, GI note  Principal Problem:   GI bleed  Active Problems:   Symptomatic anemia   Anemia due to chronic blood loss   Iron  deficiency anemia    ASSESSMENT AND PLAN:  MONIKE BRAGDON is a 68 y.o. female  with a past medical history of chronic diastolic heart failure, severe tricuspid regurgitation, pulmonary hypertension, atrial fibrillation (on Xarelto ) s/p DCCV (02/19/24), COPD, chronic respiratory failure with hypoxia (6L at home), hx epistasis, hx frequent falls who presented to the ED on 03/02/2024 after being sent by PCP with abnormal labs. Patient scheduled for Endoscopy/colonoscopy tomorrow and required cardiac evaluation. Cardiology was consulted for further evaluation.    # Preoperative Cardiac Evaluation # HX epistasis # Anemia # Paroxsymal Atrial Fibrillation s/p DCCV (02/19/24) # Chronic HFpEF # COPD with chronic hypoxic  respiratory failure Patient sent by PCP due to low blood count at 6.6. Patient received 1 unit of PRBCs, Hgb improved from 6.6 to 7.4. This AM Hgb 6.9. Patient undegroing EGD/Colonoscopy on Tuesday and requiring preop cardiac evaluation. Euvolemic on exam. Echo from 05/05 with preserved EF. HR and BP stable this AM. EKG today with sinus rhythm, rate 72 bpm. -Home Xarelto  held due to low Hgb. Continue to hold Xarelto  as patient is high risk for bleeding. -Continue amiodarone  200 mg daily. -Recommend resuming home dapaglilozin 10 mg, spironolactone  25 mg, diltiazem  180 mg daily. -Discussions outpatient about possible Watchman with Dr. Andy Bannister.   Patient is at elevated but acceptable risk for proceeding with surgery given cardiac history understanding risk/benefits.  Can proceed with Endoscopy/Colonoscopy with Dr. Ole Berkeley without further cardiac diagnostics needed.    This patient's plan of care was discussed and created with Dr. Beau Bound and he is in agreement.  Signed: Creighton Doffing, PA-C  03/04/2024, 8:17 AM [facility] Cardiology

## 2024-03-05 ENCOUNTER — Encounter: Payer: Self-pay | Admitting: Certified Registered"

## 2024-03-05 ENCOUNTER — Encounter
Admission: EM | Disposition: A | Discharge status: Home / Self Care | Payer: Self-pay | Source: Home / Self Care | Attending: Internal Medicine

## 2024-03-05 ENCOUNTER — Encounter: Payer: Self-pay | Admitting: Internal Medicine

## 2024-03-05 ENCOUNTER — Inpatient Hospital Stay

## 2024-03-05 DIAGNOSIS — D122 Benign neoplasm of ascending colon: Secondary | ICD-10-CM

## 2024-03-05 DIAGNOSIS — K621 Rectal polyp: Secondary | ICD-10-CM | POA: Diagnosis not present

## 2024-03-05 DIAGNOSIS — J449 Chronic obstructive pulmonary disease, unspecified: Secondary | ICD-10-CM | POA: Diagnosis not present

## 2024-03-05 DIAGNOSIS — K635 Polyp of colon: Secondary | ICD-10-CM | POA: Diagnosis not present

## 2024-03-05 DIAGNOSIS — D123 Benign neoplasm of transverse colon: Secondary | ICD-10-CM

## 2024-03-05 DIAGNOSIS — D509 Iron deficiency anemia, unspecified: Secondary | ICD-10-CM | POA: Diagnosis not present

## 2024-03-05 DIAGNOSIS — K922 Gastrointestinal hemorrhage, unspecified: Secondary | ICD-10-CM | POA: Diagnosis not present

## 2024-03-05 HISTORY — PX: COLONOSCOPY: SHX5424

## 2024-03-05 HISTORY — PX: FLEXIBLE SIGMOIDOSCOPY: SHX5431

## 2024-03-05 HISTORY — PX: POLYPECTOMY: SHX149

## 2024-03-05 HISTORY — PX: ESOPHAGOGASTRODUODENOSCOPY: SHX5428

## 2024-03-05 LAB — TYPE AND SCREEN
ABO/RH(D): A POS
Antibody Screen: NEGATIVE
Unit division: 0
Unit division: 0

## 2024-03-05 LAB — BPAM RBC
Blood Product Expiration Date: 202506182359
Blood Product Expiration Date: 202506182359
ISSUE DATE / TIME: 202505171238
ISSUE DATE / TIME: 202505190935
Unit Type and Rh: 6200
Unit Type and Rh: 6200

## 2024-03-05 LAB — CBC
HCT: 30.6 % — ABNORMAL LOW (ref 36.0–46.0)
Hemoglobin: 8.4 g/dL — ABNORMAL LOW (ref 12.0–15.0)
MCH: 21.8 pg — ABNORMAL LOW (ref 26.0–34.0)
MCHC: 27.5 g/dL — ABNORMAL LOW (ref 30.0–36.0)
MCV: 79.5 fL — ABNORMAL LOW (ref 80.0–100.0)
Platelets: 286 10*3/uL (ref 150–400)
RBC: 3.85 MIL/uL — ABNORMAL LOW (ref 3.87–5.11)
RDW: 21.4 % — ABNORMAL HIGH (ref 11.5–15.5)
WBC: 8 10*3/uL (ref 4.0–10.5)
nRBC: 0 % (ref 0.0–0.2)

## 2024-03-05 LAB — BASIC METABOLIC PANEL WITH GFR
Anion gap: 6 (ref 5–15)
BUN: 8 mg/dL (ref 8–23)
CO2: 28 mmol/L (ref 22–32)
Calcium: 9 mg/dL (ref 8.9–10.3)
Chloride: 106 mmol/L (ref 98–111)
Creatinine, Ser: 0.68 mg/dL (ref 0.44–1.00)
GFR, Estimated: >60 mL/min (ref >60)
Glucose, Bld: 85 mg/dL (ref 70–99)
Potassium: 3.4 mmol/L — ABNORMAL LOW (ref 3.5–5.1)
Sodium: 140 mmol/L (ref 135–145)

## 2024-03-05 SURGERY — EGD (ESOPHAGOGASTRODUODENOSCOPY)
Anesthesia: General

## 2024-03-05 SURGERY — SIGMOIDOSCOPY, FLEXIBLE

## 2024-03-05 MED ORDER — POLYSACCHARIDE IRON COMPLEX 150 MG PO CAPS
150.0000 mg | ORAL_CAPSULE | Freq: Every day | ORAL | Status: DC
  Administered 2024-03-05 – 2024-03-07 (×3): 150 mg via ORAL
  Filled 2024-03-05 (×3): qty 1

## 2024-03-05 MED ORDER — SODIUM CHLORIDE 0.9 % IV SOLN: INTRAVENOUS | Status: DC

## 2024-03-05 MED ORDER — PROPOFOL 10 MG/ML IV BOLUS
INTRAVENOUS | Status: DC | PRN
Start: 2024-03-05 — End: 2024-03-05
  Administered 2024-03-05: 100 ug/kg/min via INTRAVENOUS
  Administered 2024-03-05: 50 mg via INTRAVENOUS
  Administered 2024-03-05: 150 ug/kg/min via INTRAVENOUS

## 2024-03-05 MED ORDER — PROPOFOL 500 MG/50ML IV EMUL: INTRAVENOUS | Status: DC | PRN

## 2024-03-05 MED ORDER — SODIUM CHLORIDE (PF) 0.9 % IJ SOLN
PREFILLED_SYRINGE | INTRAMUSCULAR | Status: DC | PRN
  Administered 2024-03-05: 2 mL
  Administered 2024-03-05: 1 mL

## 2024-03-05 MED ORDER — EPINEPHRINE 1 MG/10ML IJ SOSY
PREFILLED_SYRINGE | INTRAMUSCULAR | Status: AC
  Filled 2024-03-05: qty 10

## 2024-03-05 MED ORDER — SODIUM CHLORIDE 0.9% FLUSH
10.0000 mL | Freq: Two times a day (BID) | INTRAVENOUS | Status: DC
  Administered 2024-03-05 – 2024-03-07 (×4): 10 mL via INTRAVENOUS

## 2024-03-05 MED ORDER — LIDOCAINE HCL (CARDIAC) PF 100 MG/5ML IV SOSY
PREFILLED_SYRINGE | INTRAVENOUS | Status: DC | PRN
  Administered 2024-03-05: 100 mg via INTRAVENOUS

## 2024-03-05 MED ORDER — POLYSACCHARIDE IRON COMPLEX 150 MG PO CAPS: 150.0000 mg | ORAL_CAPSULE | Freq: Every day | ORAL | 3 refills | Status: DC

## 2024-03-05 NOTE — TOC Initial Note (Signed)
 Transition of Care [facility]) - Initial/Assessment Note    Patient Details  Name: Theresa [name] MRN: [id number] Date of Birth: [birth year]  Transition of Care [facility]) CM/SW Contact:    Alexandra Ice, RN Phone Number: 03/05/2024, 11:33 AM  Clinical Narrative:                 Patient lives with spouse, Theresa [name]. She has home oxygen . No TOC needs identified, will continue to monitor should patient have discharge needs.         Patient Goals and CMS Choice            Expected Discharge Plan and Services                                              Prior Living Arrangements/Services                       Activities of Daily Living   ADL Screening (condition at time of admission) Independently performs ADLs?: Yes (appropriate for developmental age) Is the patient deaf or have difficulty hearing?: No Does the patient have difficulty seeing, even when wearing glasses/contacts?: No Does the patient have difficulty concentrating, remembering, or making decisions?: Yes  Permission Sought/Granted                  Emotional Assessment              Admission diagnosis:  GI bleed [K92.2] Anemia, unspecified type [D64.9] Patient Active Problem List   Diagnosis Date Noted   Anemia 03/04/2024   Iron  deficiency anemia 03/03/2024   GI bleed 03/02/2024   Chronic hip pain (Right) 06/22/2023   Chronic groin pain (Right) 06/22/2023   Buttock pain (Right) 06/22/2023   Greater trochanteric bursitis (Right) 06/22/2023   Lumbar facet joint pain 02/21/2023   Grade 1 Anterolisthesis of lumbar spine (4mm) (L3/L4) 02/21/2023   Grade 1 Retrolisthesis (2mm) (L5/S1) 02/21/2023   Trigger point with back pain 12/19/2022   Lumbar trigger point syndrome 12/19/2022   COPD exacerbation (HCC) 11/07/2022   ABLA (acute blood loss anemia) 11/05/2022   Epistaxis 11/05/2022   Medication adverse effect, initial encounter 11/01/2022   Epistaxis, recurrent 11/01/2022    Anemia due to chronic blood loss 10/06/2022   History of hemoptysis 09/27/2022   History of RSV infection 09/27/2022   Chronic bacterial conjunctivitis of both eyes 09/02/2022   Symptomatic anemia 08/28/2022   H/O: upper GI bleed 08/26/2022   Shortness of breath 08/09/2022   Atrial fibrillation with rapid ventricular response (HCC) 07/19/2022   Duodenal ulcer disease 06/27/2022   Coronary artery disease without angina pectoris 06/26/2022   GERD (gastroesophageal reflux disease) 06/26/2022   Severe tricuspid regurgitation 06/26/2022   Paroxysmal atrial flutter (HCC) 04/26/2022   Hyperlipidemia, unspecified 04/25/2022   Abnormal MRI, cervical spine 04/11/2022   Obesity (BMI 30-39.9) 04/02/2022   Pulmonary hypertension (HCC) 04/01/2022   Depression with anxiety    COPD with acute exacerbation (HCC) 08/06/2021   Leukocytosis 08/06/2021   Chronic shoulder pain (Bilateral) 05/05/2021   Chronic use of opiate for therapeutic purpose 01/26/2021   Pharmacologic therapy 04/08/2020   Lumbar disc extrusion (L2-3) (Left) 09/10/2019   Chronic knee pain (Left) 08/14/2019   Chronic left-sided lumbar radiculopathy 08/14/2019   Abnormal MRI, lumbar spine (12/09/2021) 08/14/2019   Cervicalgia (Bilateral) (R>L) 09/24/2018   Spondylosis  without myelopathy or radiculopathy, lumbosacral region 09/24/2018   Chronic lower extremity pain (Bilateral) (R>L) 01/25/2018   Chronic hip pain (3ry area of Pain) (Bilateral) (L>R) 11/27/2017   Arthralgia of acromioclavicular joint (Right) 06/15/2017   Acromioclavicular joint DJD (Right) 06/15/2017   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   Chronic shoulder pain (Left) 05/11/2017   Lumbar facet hypertrophy (L3-4, L5-S1) (Bilateral) 11/17/2016   Lumbar spinal stenosis (L4-5) 11/17/2016   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) 11/17/2016   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) 11/17/2016   Lumbosacral radiculopathy at L4 11/17/2016    Sleep apnea 06/02/2016   Lumbar facet syndrome (Bilateral) (R>L) 01/12/2016   Chronic sacroiliac joint pain (Bilateral) 01/12/2016   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) 12/28/2015   Acute on chronic respiratory failure with hypoxia and hypercapnia (HCC) 11/03/2015   Coagulation disorder (HCC) 10/22/2015   Chronic pain syndrome (significant psychosocial component) 09/21/2015   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 09/21/2015   Long term current use of opiate analgesic 09/16/2015   Long term prescription opiate use 09/16/2015   Encounter for therapeutic drug level monitoring 09/16/2015   Encounter for chronic pain management 09/16/2015   Neurogenic pain 09/16/2015   Neuropathic pain 09/16/2015   Musculoskeletal pain 09/16/2015   Lumbar spondylosis (L4-5) 09/16/2015   Chronic low back pain (1ry area of Pain) (Bilateral) (R>L) w/o sciatica 09/16/2015   Chronic lower extremity pain (2ry area of Pain) (Left) 09/16/2015   Chronic lumbar radicular pain (L4 & S1 Dermatome) (Left) 09/16/2015   Chronic shoulder pain (Right side) 09/16/2015   Chronic upper extremity pain (Right-sided) 09/16/2015   Cervical radiculitis (Right side) 09/16/2015   Abnormal x-ray of lumbar spine 09/16/2015   Chronic diastolic heart failure (HCC) 07/15/2015   Severe tobacco use disorder 07/15/2015   Chronic respiratory failure with hypoxia (HCC) 04/28/2015   Carpal tunnel syndrome 07/16/2014   Anxiety state 07/16/2014   Neuritis or radiculitis due to rupture of lumbar intervertebral disc 07/02/2014   DDD (degenerative disc disease), lumbar 07/02/2014   COPD (chronic obstructive pulmonary disease) (HCC) 02/27/2014   Hypertension 03/27/2008   PCP:  Arlen Belton, MD Pharmacy:   [facility] - [place], [place] - [address] [place] [address] Phone: [phone] Fax: [phone]     Social Drivers of Health (SDOH) Social History: SDOH Screenings    Food Insecurity: No Food Insecurity (03/02/2024)  Recent Concern: Food Insecurity - Food Insecurity Present (02/26/2024)   Received from [facility]  Housing: Unknown (03/02/2024)  Transportation Needs: No Transportation Needs (03/02/2024)  Utilities: Not At Risk (03/02/2024)  Alcohol Screen: Low Risk  (09/02/2022)  Depression (PHQ2-9): Low Risk  (01/24/2024)  Financial Resource Strain: Medium Risk (02/26/2024)   Received from [facility]  Physical Activity: Inactive (08/24/2022)   Received from [facility], [facility]  Social Connections: Moderately Integrated (03/02/2024)  Stress: Stress Concern Present (08/24/2022)   Received from [facility]  Tobacco Use: Medium Risk (03/02/2024)   SDOH Interventions:     Readmission Risk Interventions    06/21/2022   12:54 PM 08/09/2021   10:43 AM  Readmission Risk Prevention Plan  Transportation Screening Complete Complete  PCP or Specialist Appt within 3-5 Days  Complete  HRI or Home Care Consult  Complete  Social Work Consult for Recovery Care Planning/Counseling  Complete  Palliative Care Screening  Not Applicable  Medication  Review Oceanographer) Complete Complete  PCP or Specialist appointment within 3-5 days of discharge Complete   SW Recovery Care/Counseling Consult Complete   Palliative Care Screening Not Applicable   Skilled Nursing Facility Not Applicable

## 2024-03-05 NOTE — Op Note (Signed)
 [facility] Patient Name: Theresa [name] Procedure Date: 03/05/2024 11:37 AM MRN: [id number] Account #: [id number] Date of Birth: [birth year] Admit Type: Inpatient Age: 68 Room: [facility] ENDO ROOM 4 Gender: Female Note Status: Finalized Instrument Name: Cristino Donna Endoscope [id number] Procedure:             Upper GI endoscopy Indications:           Iron  deficiency anemia Providers:             Marnee Sink MD, MD Medicines:             Propofol  per Anesthesia Complications:         No immediate complications. Procedure:             Pre-Anesthesia Assessment:                        - Prior to the procedure, a History and Physical was                         performed, and patient medications and allergies were                         reviewed. The patient's tolerance of previous                         anesthesia was also reviewed. The risks and benefits                         of the procedure and the sedation options and risks                         were discussed with the patient. All questions were                         answered, and informed consent was obtained. Prior                         Anticoagulants: The patient has taken no anticoagulant                         or antiplatelet agents. ASA Grade Assessment: II - A                         patient with mild systemic disease. After reviewing                         the risks and benefits, the patient was deemed in                         satisfactory condition to undergo the procedure.                        After obtaining informed consent, the endoscope was                         passed under direct vision. Throughout the procedure,                         the patient's  blood pressure, pulse, and oxygen                          saturations were monitored continuously. The Endoscope                         was introduced through the mouth, and advanced to the                         third part of  duodenum. The upper GI endoscopy was                         accomplished without difficulty. The patient tolerated                         the procedure well. Findings:      The examined esophagus was normal.      A small amount of food (residue) was found in the entire examined       stomach.      The examined duodenum was normal. Impression:            - Normal esophagus.                        - A small amount of food (residue) in the stomach.                        - Normal examined duodenum.                        - No specimens collected. Recommendation:        - Return patient to hospital ward for ongoing care.                        - Resume previous diet.                        - Continue present medications.                        - Perform a colonoscopy today. Procedure Code(s):     --- Professional ---                        [phone], Esophagogastroduodenoscopy, flexible,                         transoral; diagnostic, including collection of                         specimen(s) by brushing or washing, when performed                         (separate procedure) Diagnosis Code(s):     --- Professional ---                        D50.9, Iron  deficiency anemia, unspecified CPT copyright 2022 American Medical Association. All rights reserved. The codes documented in this report are preliminary and upon coder review may  be revised to meet current compliance requirements. Marnee Sink MD, MD 03/05/2024 11:53:44 AM  This report has been signed electronically. Number of Addenda: 0 Note Initiated On: 03/05/2024 11:37 AM Estimated Blood Loss:  Estimated blood loss: none.      [facility]

## 2024-03-05 NOTE — Progress Notes (Signed)
 [facility]  - [place] at [facility]   PATIENT NAME: Theresa [name]    MR#:  [id number]  DATE OF BIRTH:  02-01-1956  SUBJECTIVE:  No family at bedside Hgb stable at 8.4 today patient underwent EGD and colonoscopy. Had multiple polyps removed. Had lunch and thereafter patient started having some dark maroon blood clots per rectum. She was taken by G.I. again for flex sigmoidoscopy and had clips placed due to polypectomy site bleeding.  VITALS:  Blood pressure (!) 109/55, pulse 76, temperature (!) 96.4 F (35.8 C), temperature source Temporal, resp. rate 20, height 5\' 2"  (1.575 m), weight 96.1 kg, SpO2 99%.  PHYSICAL EXAMINATION:   GENERAL:  68 y.o.-year-old patient with no acute distress.  LUNGS: decreased breath sounds bilaterally CARDIOVASCULAR: S1, S2 normal. ABDOMEN: Soft, nontender, nondistended.  EXTREMITIES: +  edema b/l.    NEUROLOGIC: nonfocal  patient is alert and awake SKIN: bruises both hands  LABORATORY PANEL:  CBC Recent Labs  Lab 03/05/24 0438  WBC 8.0  HGB 8.4*  HCT 30.6*  PLT 286    Chemistries  Recent Labs  Lab 03/02/24 1100 03/02/24 1300 03/03/24 0427 03/05/24 0438  NA 139  --    < > 140  K 3.1*  --    < > 3.4*  CL 101  --    < > 106  CO2 31  --    < > 28  GLUCOSE 104*  --    < > 85  BUN 11  --    < > 8  CREATININE 0.75  --    < > 0.68  CALCIUM  9.0  --    < > 9.0  MG  --  2.8*  --   --   AST 30  --   --   --   ALT 34  --   --   --   ALKPHOS 91  --   --   --   BILITOT 0.5  --   --   --    < > = values in this interval not displayed.   Cardiac Enzymes No results for input(s): "TROPONINI" in the last 168 hours. RADIOLOGY:  No results found.   Assessment and Plan Theresa [name] is a 68 y.o. female with medical history significant of PAF on Xarelto , HTN/HFpEF, COPD Gold stage III, chronic hypoxic respiratory failure on 4-5 L continuously, GERD, anxiety/depression, obesity, presented with worsening of general weakness,  exertional dyspnea with intermittent abdominal pain.  She only takes oxycodone  for back pain, does not take NSAIDS or EtOH. No alcohol consumption.  CT abdomen pelvis with contrast showed no acute findings.  Blood work showed hemoglobin 6.6 compared to baseline 11.9 about 6 months ago.   Symptomatic anemia/IDA/Chronic anticoagulation H/o PUD (EGD 2023--non bleeding Duodenal ulcer and gastritis) - Clinically suspect new onset of upper GI bleed given the history of recent onset of abdominal pain.   --CT with contrast today did not show significant signs of active bleeding. -PPI twice daily --6.6--1 unit BT--7.2--7.1--6.9--2nd uit --holding Xarelto  for now ---GI consult --Dr Antony Baumgartner aware--Dr Ole Berkeley to do EGD/Colonoscopy in am --start Iron  pills --refer to Hematology for IV iron  as out pt--Dr Randy Buttery made aware for out pt f/u --Pre-op Cardiology clearance obtained from Dr Beau Bound.  --Cardiology recs to HOLD xarelto  at discharge given high risk of bleeding for now --5/20--EGD - Normal esophagus.                        -  A small amount of food (residue) in the stomach.                        - Normal examined duodenum.                        - No specimens collected.  Colonoscopy--- One 5 mm polyp in the rectum, removed with a cold                         snare. Resected and retrieved. Clip (MR conditional)                         was placed. Clip manufacturer: [employer].                        - One 4 mm polyp in the sigmoid colon, removed with a                         cold snare. Resected and retrieved.                        - Two 3 to 6 mm polyps in the transverse colon,                         removed with a cold snare. Resected and retrieved.                        - Two 4 to 5 mm polyps in the ascending colon, removed  -- patient started having dark maroon stools after procedure and underwent flex sigmoidoscopy flexible sigmoidoscopy- Bleeding in the transverse colon secondary to                          previous polypectomy. Injected. Clips (MR conditional)                         were placed. Clip manufacturer: [employer].                        - All the polypectomy sites were oozing blood. -- G.I. recommends clear liquid diet for now. Will check hemoglobin tomorrow. If three bleeds consider IR/vascular for embolization  COPD with chronic hypoxic respiratory failure - No acute concern, continue ICS and LABA - As needed albuterol  --chronic use of oxygen    HTN Chronic HFpEF - Euvolemic - Hold off home BP meds including spironolactone , Cardizem , and Lasix    PAF - Continue amiodarone  - Hold off Xarelto --at discharge also --pt is s/p DCCV on 02/19/24--in SR at present   Obesity - Estimated BMI more than 35, calorie control recommended.  Procedures: Family communication none today Consults :GI, [facility]    CODE STATUS: full DVT Prophylaxis :scd Level of care: Telemetry Medical Status is: Inpatient Remains inpatient appropriate because: IDA w/u and re-bleed after poplypectomy    TOTAL TIME TAKING CARE OF THIS PATIENT: 40 minutes.  >50% time spent on counselling and coordination of care  Note: This dictation was prepared with Dragon dictation along with smaller phrase technology. Any transcriptional errors that result from this process are unintentional.  Limmie Ren.D  [facility]   CC: Primary care physician; Tawnya Fava, Waddell Guess, MD

## 2024-03-05 NOTE — Anesthesia Preprocedure Evaluation (Signed)
 Anesthesia Evaluation  Patient identified by MRN, date of birth, ID band Patient awake    Reviewed: Allergy & Precautions, H&P , NPO status , Patient's Chart, lab work & pertinent test results, reviewed documented beta blocker date and time   Airway Mallampati: II   Neck ROM: full    Dental  (+) Poor Dentition   Pulmonary shortness of breath, with exertion and Long-Term Oxygen  Therapy, sleep apnea and Continuous Positive Airway Pressure Ventilation , pneumonia, resolved, COPD,  COPD inhaler, former smoker 6L of O2 at home   Pulmonary exam normal        Cardiovascular Exercise Tolerance: Poor hypertension, On Medications + CAD and +CHF  Atrial Fibrillation  Rhythm:regular Rate:Normal     Neuro/Psych  PSYCHIATRIC DISORDERS Anxiety Depression     Neuromuscular disease    GI/Hepatic Neg liver ROS, PUD,GERD  Medicated,,  Endo/Other  Hypothyroidism    Renal/GU      Musculoskeletal   Abdominal   Peds  Hematology  (+) Blood dyscrasia, anemia   Anesthesia Other Findings Past Medical History: 10/03/2017: Acute postoperative pain No date: Anxiety No date: BiPAP (biphasic positive airway pressure) dependence     Comment:  at hs No date: Carpal tunnel syndrome No date: CHF (congestive heart failure) (HCC)     Comment:  1/18 No date: CHF (congestive heart failure) (HCC) No date: Chronic generalized abdominal pain No date: Chronic rhinitis No date: COPD (chronic obstructive pulmonary disease) (HCC) No date: DDD (degenerative disc disease), cervical No date: DDD (degenerative disc disease), lumbosacral No date: Depression No date: Dyspnea No date: Edema No date: Flu     Comment:  1/18 No date: Gastritis No date: GERD (gastroesophageal reflux disease) No date: Hematuria 07/17/2018: Hernia of abdominal wall No date: Hernia, abdominal No date: Hypertension No date: Kidney stones No date: Low back pain No date: Lumbar  radiculitis No date: Malodorous urine No date: Muscle weakness No date: Obesity No date: Oxygen  dependent 02/03/2020: Pneumonia due to COVID-19 virus No date: Renal cyst No date: Sensory urge incontinence No date: Thyroid  activity decreased     Comment:  9/19 No date: Tobacco abuse No date: Wheezing Past Surgical History: No date: ABDOMINAL HYSTERECTOMY 04/29/2022: CARDIOVERSION; N/A     Comment:  Procedure: CARDIOVERSION;  Surgeon: Michelle Aid,               MD;  Location: [facility] ORS;  Service: Cardiovascular;                Laterality: N/A; 01/30/2024: CARDIOVERSION; N/A     Comment:  Procedure: CARDIOVERSION;  Surgeon: Antonette Batters,               MD;  Location: [facility] ORS;  Service: Cardiovascular;                Laterality: N/A; 2012: CARPAL TUNNEL RELEASE; Left No date: cataract surgery; Bilateral 08/13/2018: EPIGASTRIC HERNIA REPAIR; N/A     Comment:  HERNIA REPAIR EPIGASTRIC ADULT; polypropylene mesh               reinforcement.  Surgeon: Marshall Skeeter, MD;                Location: [facility] ORS;  Service: General;  Laterality: N/A; 06/21/2022: ESOPHAGOGASTRODUODENOSCOPY; N/A     Comment:  Procedure: ESOPHAGOGASTRODUODENOSCOPY (EGD);  Surgeon:               Toledo, Alphonsus Jeans, MD;  Location: [facility] ENDOSCOPY;  Service: Gastroenterology;  Laterality: N/A; 07/11/2019: RIGHT/LEFT HEART CATH AND CORONARY ANGIOGRAPHY; N/A     Comment:  Procedure: RIGHT/LEFT HEART CATH AND CORONARY               ANGIOGRAPHY;  Surgeon: Antonette Batters, MD;  Location:              [facility] INVASIVE CV LAB;  Service: Cardiovascular;                Laterality: N/A; 04/29/2022: TEE WITHOUT CARDIOVERSION; N/A     Comment:  Procedure: TRANSESOPHAGEAL ECHOCARDIOGRAM (TEE);                Surgeon: Michelle Aid, MD;  Location: [facility] ORS;                Service: Cardiovascular;  Laterality: N/A; No date: TONSILLECTOMY No date: TUBAL LIGATION 08/13/2018: UMBILICAL HERNIA REPAIR;  N/A     Comment:  HERNIA REPAIR UMBILICAL ADULT; polypropylene mesh               reinforcement Surgeon: Marshall Skeeter, MD;  Location:              [facility] ORS;  Service: General;  Laterality: N/A; BMI    Body Mass Index: 36.95 kg/m     Reproductive/Obstetrics negative OB ROS                             Anesthesia Physical Anesthesia Plan  ASA: 4  Anesthesia Plan: General   Post-op Pain Management: Minimal or no pain anticipated   Induction: Intravenous  PONV Risk Score and Plan: 3 and Propofol  infusion, TIVA and Ondansetron   Airway Management Planned: Nasal Cannula  Additional Equipment: None  Intra-op Plan:   Post-operative Plan:   Informed Consent: I have reviewed the patients History and Physical, chart, labs and discussed the procedure including the risks, benefits and alternatives for the proposed anesthesia with the patient or authorized representative who has indicated his/her understanding and acceptance.     Dental advisory given  Plan Discussed with: CRNA and Surgeon  Anesthesia Plan Comments: (Discussed risks of anesthesia with patient, including possibility of difficulty with spontaneous ventilation under anesthesia necessitating airway intervention, PONV, and rare risks such as cardiac or respiratory or neurological events, and allergic reactions. Discussed the role of CRNA in patient's perioperative care. Patient understands.)       Anesthesia Quick Evaluation

## 2024-03-05 NOTE — Op Note (Signed)
 [facility] Patient Name: Theresa [name] Procedure Date: 03/05/2024 2:51 PM MRN: [id number] Account #: [id number] Date of Birth: 1956-10-12 Admit Type: Inpatient Age: 68 Room: [facility] ENDO ROOM 4 Gender: Female Note Status: Finalized Instrument Name: Charlyn Cooley [id number] Procedure:             Flexible Sigmoidoscopy Indications:           Hematochezia Providers:             Marnee Sink MD, MD Medicines:             None Complications:         No immediate complications. Procedure:             Pre-Anesthesia Assessment:                        - Prior to the procedure, a History and Physical was                         performed, and patient medications and allergies were                         reviewed. The patient's tolerance of previous                         anesthesia was also reviewed. The risks and benefits                         of the procedure and the sedation options and risks                         were discussed with the patient. All questions were                         answered, and informed consent was obtained. Prior                         Anticoagulants: The patient has taken no anticoagulant                         or antiplatelet agents. ASA Grade Assessment: II - A                         patient with mild systemic disease. After reviewing                         the risks and benefits, the patient was deemed in                         satisfactory condition to undergo the procedure.                        - Prior to the procedure, a History and Physical was                         performed, and patient medications and allergies were                         reviewed. The patient's tolerance of  previous                         anesthesia was also reviewed. The risks and benefits                         of the procedure and the sedation options and risks                         were discussed with the patient. All questions were                          answered, and informed consent was obtained. Prior                         Anticoagulants: The patient has taken Xarelto                          (rivaroxaban ), last dose was 3 days prior to                         procedure. ASA Grade Assessment: II - A patient with                         mild systemic disease. After reviewing the risks and                         benefits, the patient was deemed in satisfactory                         condition to undergo the procedure.                        After obtaining informed consent, the scope was passed                         under direct vision. The Colonoscope was introduced                         through the anus and advanced to the the left                         transverse colon. The flexible sigmoidoscopy was                         accomplished without difficulty. The patient tolerated                         the procedure well. The quality of the bowel                         preparation was fair. Findings:      The perianal and digital rectal examinations were normal. Pertinent       negatives include normal sphincter tone.      Bleeding was seen in the transverse colon, secondary to previous       polypectomy procedure. Area was successfully injected with 3 mL of a 0.1       mg/mL solution of epinephrine   for hemostasis. To repair the defect, the       tissue edges were approximated and six hemostatic clips were       successfully placed (MR conditional). Closure of the defect was       successful. Clip manufacturer: [employer]. There was no bleeding       at the end of the procedure.      All the polypectomy sites were oozing blood. Impression:            - Preparation of the colon was fair.                        - Bleeding in the transverse colon secondary to                         previous polypectomy. Injected. Clips (MR conditional)                         were placed. Clip manufacturer:  [employer].                        - All the polypectomy sites were oozing blood.                        - No specimens collected. Recommendation:        - Return patient to hospital ward for ongoing care.                        - Clear liquid diet. Procedure Code(s):     --- Professional ---                        [phone], Sigmoidoscopy, flexible; with control of                         bleeding, any method CPT copyright 2022 American [employer]. All rights reserved. The codes documented in this report are preliminary and upon coder review may  be revised to meet current compliance requirements. Marnee Sink MD, MD 03/05/2024 4:10:48 PM This report has been signed electronically. Number of Addenda: 0 Note Initiated On: 03/05/2024 2:51 PM Total Procedure Duration: 0 hours 54 minutes 28 seconds  Estimated Blood Loss:  Estimated blood loss: none.      [facility]

## 2024-03-05 NOTE — Progress Notes (Signed)
 Patient back to unit from Upper  Endoscopy and Colonoscopy @ around 1315. Patient stated she had 3 times bleeding episode from rectum while urinating. On assessment blood was almost tennis size. Surgeon notified. Patient transferred back to OR for Sigmoidoscopy.

## 2024-03-05 NOTE — Anesthesia Postprocedure Evaluation (Signed)
 Anesthesia Post Note  Patient: Theresa [name]  Procedure(s) Performed: EGD (ESOPHAGOGASTRODUODENOSCOPY) COLONOSCOPY POLYPECTOMY, INTESTINE  Patient location during evaluation: Endoscopy Anesthesia Type: General Level of consciousness: awake and alert Pain management: pain level controlled Vital Signs Assessment: post-procedure vital signs reviewed and stable Respiratory status: spontaneous breathing, nonlabored ventilation, respiratory function stable and patient connected to nasal cannula oxygen  Cardiovascular status: blood pressure returned to baseline and stable Postop Assessment: no apparent nausea or vomiting Anesthetic complications: no  There were no known notable events for this encounter.   Last Vitals:  Vitals:   03/05/24 0823 03/05/24 1134  BP: 118/81 (!) 142/71  Pulse: 77 77  Resp: 19 (!) 21  Temp: 36.6 C 36.6 C  SpO2: 100% 100%    Last Pain:  Vitals:   03/05/24 1134  TempSrc: Temporal  PainSc:                  Enrique Harvest

## 2024-03-05 NOTE — Op Note (Signed)
 [facility]astroenterology Patient Name: Theresa [name] Procedure Date: 03/05/2024 11:35 AM MRN: [id number] Account #: [id number] Date of Birth: 1956/09/21 Admit Type: Inpatient Age: 68 Room: [facility] ENDO ROOM 4 Gender: Female Note Status: Finalized Instrument Name: Charlyn Cooley [id number] Procedure:             Colonoscopy Indications:           Iron  deficiency anemia Providers:             Marnee Sink MD, MD Referring MD:          Tawnya Fava Medicines:             Propofol  per Anesthesia Complications:         No immediate complications. Procedure:             Pre-Anesthesia Assessment:                        - Prior to the procedure, a History and Physical was                         performed, and patient medications and allergies were                         reviewed. The patient's tolerance of previous                         anesthesia was also reviewed. The risks and benefits                         of the procedure and the sedation options and risks                         were discussed with the patient. All questions were                         answered, and informed consent was obtained. Prior                         Anticoagulants: The patient has taken no anticoagulant                         or antiplatelet agents. ASA Grade Assessment: II - A                         patient with mild systemic disease. After reviewing                         the risks and benefits, the patient was deemed in                         satisfactory condition to undergo the procedure.                        After obtaining informed consent, the colonoscope was                         passed under direct vision. Throughout the procedure,  the patient's blood pressure, pulse, and oxygen                          saturations were monitored continuously. The                         Colonoscope was introduced through the anus and                          advanced to the the cecum, identified by appendiceal                         orifice and ileocecal valve. The colonoscopy was                         performed without difficulty. The patient tolerated                         the procedure well. The quality of the bowel                         preparation was fair. Findings:      The perianal and digital rectal examinations were normal.      A 5 mm polyp was found in the rectum. The polyp was sessile. The polyp       was removed with a cold snare. Resection and retrieval were complete. To       stop active bleeding, one hemostatic clip was successfully placed (MR       conditional). Clip manufacturer: [employer]. There was no       bleeding at the end of the procedure.      A 4 mm polyp was found in the sigmoid colon. The polyp was sessile. The       polyp was removed with a cold snare. Resection and retrieval were       complete.      Two polyps were found in the transverse colon. The polyps were 3 to 6 mm       in size. These polyps were removed with a cold snare. Resection and       retrieval were complete.      Two sessile polyps were found in the ascending colon. The polyps were 4       to 5 mm in size. These polyps were removed with a cold snare. Resection       and retrieval were complete. Impression:            - Preparation of the colon was fair.                        - One 5 mm polyp in the rectum, removed with a cold                         snare. Resected and retrieved. Clip (MR conditional)                         was placed. Clip manufacturer: [employer].                        - One 4 mm polyp in the sigmoid  colon, removed with a                         cold snare. Resected and retrieved.                        - Two 3 to 6 mm polyps in the transverse colon,                         removed with a cold snare. Resected and retrieved.                        - Two 4 to 5 mm polyps in the ascending colon, removed                          with a cold snare. Resected and retrieved. Recommendation:        - Discharge patient to home.                        - Resume previous diet.                        - Continue present medications.                        - Await pathology results.                        - The patient should follow up as an outpatient with                         her primary GI doc for the anemia and possible                         outpatient capsule endoscopy. Procedure Code(s):     --- Professional ---                        [phone], Colonoscopy, flexible; with removal of                         tumor(s), polyp(s), or other lesion(s) by snare                         technique Diagnosis Code(s):     --- Professional ---                        D50.9, Iron  deficiency anemia, unspecified                        D12.3, Benign neoplasm of transverse colon (hepatic                         flexure or splenic flexure)                        D12.2, Benign neoplasm of ascending colon CPT copyright 2022 American [employer] Association. All rights reserved. The codes documented in this report are preliminary and upon coder review may  be revised to meet current compliance  requirements. Marnee Sink MD, MD 03/05/2024 12:19:55 PM This report has been signed electronically. Number of Addenda: 0 Note Initiated On: 03/05/2024 11:35 AM Scope Withdrawal Time: 0 hours 12 minutes 10 seconds  Total Procedure Duration: 0 hours 19 minutes 41 seconds  Estimated Blood Loss:  Estimated blood loss: none.      [facility]

## 2024-03-05 NOTE — Progress Notes (Signed)
 [facility] CARDIOLOGY PROGRESS NOTE       Patient ID: Theresa [name] MRN: [id number] DOB/AGE: April 26, 1956 68 y.o.  Admit date: 03/02/2024 Referring Physician Melvinia Stager Primary Physician Tawnya Fava, Waddell Guess, MD Primary Cardiologist Dr. Beau Bound Reason for Consultation POC  HPI: Theresa [name] is a 68 y.o. female  with a past medical history of chronic diastolic heart failure, severe tricuspid regurgitation, pulmonary hypertension, atrial fibrillation (on Xarelto ) s/p DCCV (02/19/24), COPD, chronic respiratory failure with hypoxia (6L at home), hx epistasis, hx frequent falls who presented to the ED on 03/02/2024 after being sent by PCP with abnormal labs. Patient scheduled for Endoscopy/colonoscopy tomorrow and required cardiac evaluation. Cardiology was consulted for further evaluation.   Interval History: -Patient seen and examined this AM and sitting up in hospital bed in no acute distress. Patient states she feels ok and denies chest pain, palpitations or SOB.  -Patients BP and HR stable this AM. Not on tele. -Patient remains on 5L with stable SpO2.  -Patient scheduled for EGD/colonoscopy at 11:30 AM today   Review of systems complete and found to be negative unless listed above    Past Medical History:  Diagnosis Date   Acute postoperative pain 10/03/2017   Anxiety    BiPAP (biphasic positive airway pressure) dependence    at hs   Carpal tunnel syndrome    CHF (congestive heart failure) (HCC)    1/18   CHF (congestive heart failure) (HCC)    Chronic generalized abdominal pain    Chronic rhinitis    COPD (chronic obstructive pulmonary disease) (HCC)    DDD (degenerative disc disease), cervical    DDD (degenerative disc disease), lumbosacral    Depression    Dyspnea    Edema    Flu    1/18   Gastritis    GERD (gastroesophageal reflux disease)    Hematuria    Hernia of abdominal wall 07/17/2018   Hernia, abdominal    Hypertension    Kidney stones     Low back pain    Lumbar radiculitis    Malodorous urine    Muscle weakness    Obesity    Oxygen  dependent    Pneumonia due to COVID-19 virus 02/03/2020   Renal cyst    Sensory urge incontinence    Thyroid  activity decreased    9/19   Tobacco abuse    Wheezing     Past Surgical History:  Procedure Laterality Date   ABDOMINAL HYSTERECTOMY     CARDIOVERSION N/A 04/29/2022   Procedure: CARDIOVERSION;  Surgeon: Michelle Aid, MD;  Location: [facility] ORS;  Service: Cardiovascular;  Laterality: N/A;   CARDIOVERSION N/A 01/30/2024   Procedure: CARDIOVERSION;  Surgeon: Antonette Batters, MD;  Location: [facility] ORS;  Service: Cardiovascular;  Laterality: N/A;   CARDIOVERSION N/A 02/19/2024   Procedure: CARDIOVERSION;  Surgeon: Antonette Batters, MD;  Location: [facility] ORS;  Service: Cardiovascular;  Laterality: N/A;   CARPAL TUNNEL RELEASE Left 2012   cataract surgery Bilateral    EPIGASTRIC HERNIA REPAIR N/A 08/13/2018   HERNIA REPAIR EPIGASTRIC ADULT; polypropylene mesh reinforcement.  Surgeon: Marshall Skeeter, MD;  Location: [facility] ORS;  Service: General;  Laterality: N/A;   ESOPHAGOGASTRODUODENOSCOPY N/A 06/21/2022   Procedure: ESOPHAGOGASTRODUODENOSCOPY (EGD);  Surgeon: Toledo, Alphonsus Jeans, MD;  Location: [facility] ENDOSCOPY;  Service: Gastroenterology;  Laterality: N/A;   RIGHT/LEFT HEART CATH AND CORONARY ANGIOGRAPHY N/A 07/11/2019   Procedure: RIGHT/LEFT HEART CATH AND CORONARY ANGIOGRAPHY;  Surgeon: Antonette Batters, MD;  Location: [facility] INVASIVE CV LAB;  Service: Cardiovascular;  Laterality: N/A;   TEE WITHOUT CARDIOVERSION N/A 04/29/2022   Procedure: TRANSESOPHAGEAL ECHOCARDIOGRAM (TEE);  Surgeon: Michelle Aid, MD;  Location: [facility] ORS;  Service: Cardiovascular;  Laterality: N/A;   TONSILLECTOMY     TUBAL LIGATION     UMBILICAL HERNIA REPAIR N/A 08/13/2018   HERNIA REPAIR UMBILICAL ADULT; polypropylene mesh reinforcement Surgeon: Marshall Skeeter, MD;  Location: [facility] ORS;  Service:  General;  Laterality: N/A;    Medications Prior to Admission  Medication Sig Dispense Refill Last Dose/Taking   acetaminophen  (TYLENOL ) 500 MG tablet Take 1,000 mg by mouth every 8 (eight) hours as needed for mild pain or headache.   Past Month   albuterol  (VENTOLIN  HFA) 108 (90 Base) MCG/ACT inhaler Inhale 2 puffs into the lungs every 6 (six) hours as needed for wheezing or shortness of breath. 8 g 1 03/01/2024   amiodarone  (PACERONE ) 200 MG tablet Take 1 tablet (200 mg total) by mouth daily. 30 tablet 0 03/02/2024 Morning   busPIRone  (BUSPAR ) 5 MG tablet Take 5 mg by mouth 2 (two) times daily.   03/02/2024 Morning   Cholecalciferol  50 MCG (2000 UT) CAPS Take 2,000 Units by mouth daily.   Past Month   dapagliflozin  propanediol (FARXIGA ) 10 MG TABS tablet Take 1 tablet (10 mg total) by mouth daily. 90 tablet 3 03/02/2024 Morning   diltiazem  (CARDIZEM  CD) 180 MG 24 hr capsule Take 180 mg by mouth daily.   03/02/2024 Morning   Fluticasone -Umeclidin-Vilant (TRELEGY ELLIPTA) 100-62.5-25 MCG/ACT AEPB Inhale 1 puff into the lungs daily.   03/02/2024 Morning   HYDROcodone -acetaminophen  (NORCO/VICODIN) 5-325 MG tablet Take 1 tablet by mouth every 8 (eight) hours as needed for severe pain (pain score 7-10). Must last 30 days 90 tablet 0 03/02/2024 Morning   ipratropium-albuterol  (DUONEB) 0.5-2.5 (3) MG/3ML SOLN USE 1 VIAL VIA NEBULIZER EVERY 6 HOURS AS NEEDED FOR SHORTNESS OF BREATH OR WHEEZING 1080 mL 0 Past Month   naloxone  (NARCAN ) nasal spray 4 mg/0.1 mL Place 1 spray into the nose as needed for up to 365 doses (for opioid-induced respiratory depresssion). In case of emergency (overdose), spray once into each nostril. If no response within 3 minutes, repeat application and call 911. 1 each 0 Taking As Needed   nystatin  (MYCOSTATIN ) 100000 UNIT/ML suspension Take 5 mLs by mouth 4 (four) times daily.   Past Week   omeprazole  (PRILOSEC) 40 MG capsule Take 40 mg by mouth daily.   03/02/2024 Morning   potassium  chloride SA (KLOR-CON  M) 20 MEQ tablet Take 2 tablets every morning and 1 tablet every evening 270 tablet 3 03/02/2024 Morning   Rivaroxaban  (XARELTO ) 15 MG TABS tablet Take 15 mg by mouth daily with supper.   03/02/2024 Morning   roflumilast  (DALIRESP ) 500 MCG TABS tablet Take 500 mcg by mouth daily.   03/02/2024 Morning   sertraline  (ZOLOFT ) 100 MG tablet Take 2 tablets (200 mg total) by mouth daily. Please schedule office visit before any future refill. 60 tablet 0 03/02/2024 Morning   spironolactone  (ALDACTONE ) 25 MG tablet Take 0.5 tablets (12.5 mg total) by mouth daily. 45 tablet 3 03/02/2024 Morning   tirzepatide (MOUNJARO) 12.5 MG/0.5ML Pen Inject 12.5 mg into the skin once a week. 2 mL 0 02/29/2024   torsemide  (DEMADEX ) 20 MG tablet Take 40-80 mg by mouth See admin instructions. Take 4 tablets (80mg ) by mouth every morning and take 2 tablets (40mg ) by mouth every afternoon   03/02/2024 Morning  FUROSCIX  80 MG/10ML CTKT Inject 80 mg into the skin daily as needed (If gain over 3x's weight in fluid).      HYDROcodone -acetaminophen  (NORCO/VICODIN) 5-325 MG tablet Take 1 tablet by mouth every 8 (eight) hours as needed for severe pain (pain score 7-10). Must last 30 days 90 tablet 0    [START ON 03/26/2024] HYDROcodone -acetaminophen  (NORCO/VICODIN) 5-325 MG tablet Take 1 tablet by mouth every 8 (eight) hours as needed for severe pain (pain score 7-10). Must last 30 days 90 tablet 0    OXYGEN  Inhale 5 L into the lungs continuous.      Social History   Socioeconomic History   Marital status: Married    Spouse name: Elana Grayer   Number of children: 2   Years of education: Not on file   Highest education level: 12th grade  Occupational History   Occupation: disability  Tobacco Use   Smoking status: Former    Current packs/day: 1.50    Average packs/day: 1.5 packs/day for 44.0 years (66.0 ttl pk-yrs)    Types: Cigarettes   Smokeless tobacco: Never   Tobacco comments:    Stopped 4 weeks ago May 20, 2023  Vaping Use   Vaping status: Never Used  Substance and Sexual Activity   Alcohol use: No   Drug use: Yes    Comment: prescribed hydrocodone    Sexual activity: Not Currently    Birth control/protection: Surgical  Other Topics Concern   Not on file  Social History Narrative   ** Merged History Encounter **       Lives with spouse    Social Drivers of Health   Financial Resource Strain: Medium Risk (02/26/2024)   Received from [facility]   Overall Financial Resource Strain (CARDIA)    Difficulty of Paying Living Expenses: Somewhat hard  Food Insecurity: No Food Insecurity (03/02/2024)   Hunger Vital Sign    Worried About Running Out of Food in the Last Year: Never true    Ran Out of Food in the Last Year: Never true  Recent Concern: Food Insecurity - Food Insecurity Present (02/26/2024)   Received from [facility]   Hunger Vital Sign    Worried About Running Out of Food in the Last Year: Sometimes true    Ran Out of Food in the Last Year: Often true  Transportation Needs: No Transportation Needs (03/02/2024)   PRAPARE - Administrator, Civil Service (Medical): No    Lack of Transportation (Non-Medical): No  Physical Activity: Inactive (08/24/2022)   Received from [facility], [facility]   Exercise Vital Sign    Days of Exercise per Week: 0 days    Minutes of Exercise per Session: 0 min  Stress: Stress Concern Present (08/24/2022)   Received from [facility], [facility]   [employer] of Occupational Health - Occupational Stress Questionnaire    Feeling of Stress : Very much  Social Connections: Moderately Integrated (03/02/2024)   Social Connection and Isolation Panel [NHANES]    Frequency of Communication with Friends and Family: More than three times a week    Frequency of Social Gatherings with Friends and Family: Once a week    Attends Religious  Services: More than 4 times per year    Active Member of [employer] or Organizations: No    Attends Banker Meetings: Never    Marital Status: Married  Catering manager Violence: Not At  Risk (03/02/2024)   Humiliation, Afraid, Rape, and Kick questionnaire    Fear of Current or Ex-Partner: No    Emotionally Abused: No    Physically Abused: No    Sexually Abused: No    Family History  Problem Relation Age of Onset   Heart disease Mother    Stroke Mother    Coronary artery disease Mother    Alcohol abuse Father    Heart disease Father    Lung cancer Sister    Cancer Sister    Breast cancer Sister    Cancer Brother    Cancer Brother    Kidney cancer Brother    Pneumonia Brother    Prostate cancer Neg Hx    Bladder Cancer Neg Hx      Vitals:   03/04/24 1552 03/04/24 2049 03/05/24 0511 03/05/24 0823  BP: 124/75 137/70 (!) 122/106 118/81  Pulse: 76 74 75 77  Resp: 18 18 18 19   Temp: 98.3 F (36.8 C) 98.3 F (36.8 C) 98.6 F (37 C) 97.9 F (36.6 C)  TempSrc: Oral Oral Oral   SpO2: 100% 98% 100% 100%  Weight:      Height:        PHYSICAL EXAM General: chronically ill appearing elderly female, well nourished, in no acute distress. HEENT: Normocephalic and atraumatic. Neck: No JVD.   Lungs: Normal respiratory effort on 6L. Clear bilaterally to auscultation. No wheezes, crackles, rhonchi.  Heart: HRRR. Normal S1 and S2 without gallops or murmurs.  Abdomen: Non-distended appearing.  Msk: Normal strength and tone for age. Extremities: Warm and well perfused. No clubbing, cyanosis. No edema.  Neuro: Alert and oriented X 3. Psych: Answers questions appropriately.   Labs: Basic Metabolic Panel: Recent Labs    03/02/24 1300 03/03/24 0427 03/05/24 0438  NA  --  140 140  K  --  3.3* 3.4*  CL  --  103 106  CO2  --  29 28  GLUCOSE  --  89 85  BUN  --  12 8  CREATININE  --  0.83 0.68  CALCIUM   --  8.5* 9.0  MG 2.8*  --   --    Liver Function Tests: Recent  Labs    03/02/24 1100  AST 30  ALT 34  ALKPHOS 91  BILITOT 0.5  PROT 7.0  ALBUMIN 3.7   No results for input(s): "LIPASE", "AMYLASE" in the last 72 hours. CBC: Recent Labs    03/02/24 1100 03/02/24 1729 03/03/24 0427 03/04/24 0211 03/05/24 0438  WBC 6.7  --  6.6  --  8.0  NEUTROABS 5.3  --   --   --   --   HGB 6.6*   < > 7.1* 6.9* 8.4*  HCT 25.4*   < > 25.9*  --  30.6*  MCV 76.0*  --  76.2*  --  79.5*  PLT 291  --  255  --  286   < > = values in this interval not displayed.   Cardiac Enzymes: No results for input(s): "CKTOTAL", "CKMB", "CKMBINDEX", "TROPONINIHS" in the last 72 hours. BNP: No results for input(s): "BNP" in the last 72 hours. D-Dimer: No results for input(s): "DDIMER" in the last 72 hours. Hemoglobin A1C: No results for input(s): "HGBA1C" in the last 72 hours. Fasting Lipid Panel: No results for input(s): "CHOL", "HDL", "LDLCALC", "TRIG", "CHOLHDL", "LDLDIRECT" in the last 72 hours. Thyroid  Function Tests: No results for input(s): "TSH", "T4TOTAL", "T3FREE", "THYROIDAB" in the last 72 hours.  Invalid  input(s): "FREET3" Anemia Panel: Recent Labs    03/02/24 1100 03/02/24 1102  VITAMINB12  --  500  FERRITIN 11  --   TIBC 501*  --   IRON  18*  --   RETICCTPCT 1.8  --      Radiology: DG Chest 1 View Result Date: 03/02/2024 CLINICAL DATA:  Congestive heart failure.  Dizziness. EXAM: CHEST  1 VIEW COMPARISON:  Radiograph 08/29/2023. Lung bases from abdominopelvic CT earlier today FINDINGS: The heart is mildly enlarged, stable. Unchanged peribronchial thickening. Subsegmental atelectasis in the lung bases. No significant pleural effusion. No pneumothorax. IMPRESSION: 1. Unchanged peribronchial thickening, bronchitic versus congestive. Mild cardiomegaly is unchanged. 2. Subsegmental atelectasis in the lung bases. Electronically Signed   By: Chadwick Colonel M.D.   On: 03/02/2024 13:06   CT ABDOMEN PELVIS W CONTRAST Result Date: 03/02/2024 CLINICAL DATA:   Blunt abdominal trauma EXAM: CT ABDOMEN AND PELVIS WITH CONTRAST TECHNIQUE: Multidetector CT imaging of the abdomen and pelvis was performed using the standard protocol following bolus administration of intravenous contrast. RADIATION DOSE REDUCTION: This exam was performed according to the departmental dose-optimization program which includes automated exposure control, adjustment of the mA and/or kV according to patient size and/or use of iterative reconstruction technique. CONTRAST:  OMNIPAQUE  IOHEXOL  300 MG/ML  SOLN COMPARISON:  06/19/2022 FINDINGS: Lower chest: No acute pleural or parenchymal lung disease. Hepatobiliary: Scattered hepatic cysts are again noted. No other focal liver abnormalities. Gallbladder is unremarkable. No biliary duct dilation. Pancreas: Unremarkable. No pancreatic ductal dilatation or surrounding inflammatory changes. Spleen: Normal in size without focal abnormality. Adrenals/Urinary Tract: Stable appearance of the adrenals. Nonobstructing 6 mm calculus lower pole left kidney. Right kidney is unremarkable. Bladder is moderately distended without focal abnormality. Stomach/Bowel: No bowel obstruction or ileus. Normal appendix right lower quadrant. No bowel wall thickening or inflammatory change. Vascular/Lymphatic: Aortic atherosclerosis. No enlarged abdominal or pelvic lymph nodes. Reproductive: Uterus and bilateral adnexa are unremarkable. Other: No free fluid or free intraperitoneal gas. No abdominal wall hernia. Musculoskeletal: No acute or destructive bony abnormalities. Reconstructed images demonstrate no additional findings. IMPRESSION: 1. No acute intra-abdominal or intrapelvic trauma. 2. Nonobstructing 6 mm left renal calculus. 3.  Aortic Atherosclerosis (ICD10-I70.0). Electronically Signed   By: Bobbye Burrow M.D.   On: 03/02/2024 12:17    ECHO 02/19/24 NORMAL LEFT VENTRICULAR SYSTOLIC FUNCTION WITH NO LVH  ESTIMATED EF: >55%  NORMAL LA PRESSURES WITH NORMAL  DIASTOLIC FUNCTION  NORMAL RIGHT VENTRICULAR SYSTOLIC FUNCTION  VALVULAR REGURGITATION: MILD AR, MILD MR, TRIVIAL PR, MODERATE TR  NO VALVULAR STENOSIS   TELEMETRY reviewed by me 03/05/2024: not on tele  EKG reviewed by me: sinus rhythm, rate 72 bpm  Data reviewed by me 03/05/2024: last 24h vitals tele labs imaging I/O hospitalist progress notes, GI notes  Principal Problem:   GI bleed Active Problems:   Symptomatic anemia   Anemia due to chronic blood loss   Iron  deficiency anemia   Anemia    ASSESSMENT AND PLAN:  Theresa [name] is a 68 y.o. female  with a past medical history of chronic diastolic heart failure, severe tricuspid regurgitation, pulmonary hypertension, atrial fibrillation (on Xarelto ) s/p DCCV (02/19/24), COPD, chronic respiratory failure with hypoxia (6L at home), hx epistasis, hx frequent falls who presented to the ED on 03/02/2024 after being sent by PCP with abnormal labs. Patient scheduled for Endoscopy/colonoscopy tomorrow and required cardiac evaluation. Cardiology was consulted for further evaluation.    # Preoperative Cardiac Evaluation # HX epistasis # Anemia #  Paroxsymal Atrial Fibrillation s/p DCCV (02/19/24) # Chronic HFpEF # COPD with chronic hypoxic respiratory failure Patient sent by PCP due to low blood count at 6.6. Patient received 1 unit of PRBCs, Hgb improved from 6.6 to 7.4. This AM Hgb 6.9. Patient undegroing EGD/Colonoscopy on Tuesday and requiring preop cardiac evaluation. Euvolemic on exam. Echo from 05/05 with preserved EF. HR and BP stable this AM. EKG with sinus rhythm, rate 72 bpm. -Home Xarelto  held due to low Hgb. Continue to hold Xarelto  as patient is high risk for bleeding with history of recurrent epistasis and low Hgb during this admission. -Continue amiodarone  200 mg daily. -Continue home dapaglilozin 10 mg, spironolactone  25 mg, diltiazem  180 mg daily. -Discussions outpatient about possible Watchman with Dr. Andy Bannister.   -EGD/colonoscopy scheduled for today at 11:30 AM.  Patient is at elevated but acceptable risk for proceeding with surgery given cardiac history understanding risk/benefits.  Can proceed with Endoscopy/Colonoscopy with Dr. Ole Berkeley without further cardiac diagnostics needed.    This patient's plan of care was discussed and created with Dr. Beau Bound and he is in agreement.  Signed: Creighton Doffing, PA-C  03/05/2024, 10:27 AM [facility] Card[facility]

## 2024-03-05 NOTE — Transfer of Care (Signed)
 Immediate Anesthesia Transfer of Care Note  Patient: Theresa [name]  Procedure(s) Performed: EGD (ESOPHAGOGASTRODUODENOSCOPY) COLONOSCOPY POLYPECTOMY, INTESTINE  Patient Location: Endoscopy Unit  Anesthesia Type:General  Level of Consciousness: sedated  Airway & Oxygen  Therapy: Patient Spontanous Breathing and Patient connected to face mask oxygen   Post-op Assessment: Report given to RN and Post -op Vital signs reviewed and stable  Post vital signs: Reviewed and stable  Last Vitals:  Vitals Value Taken Time  BP 104/60 03/05/24 1219  Temp    Pulse 83 03/05/24 1220  Resp 14 03/05/24 1220  SpO2 97 % 03/05/24 1220  Vitals shown include unfiled device data.  Last Pain:  Vitals:   03/05/24 1134  TempSrc: Temporal  PainSc:       Patients Stated Pain Goal: 0 (03/02/24 2203)  Complications: There were no known notable events for this encounter.

## 2024-03-06 ENCOUNTER — Encounter: Payer: Self-pay | Admitting: Gastroenterology

## 2024-03-06 DIAGNOSIS — E66812 Obesity, class 2: Secondary | ICD-10-CM

## 2024-03-06 DIAGNOSIS — I480 Paroxysmal atrial fibrillation: Secondary | ICD-10-CM

## 2024-03-06 DIAGNOSIS — D62 Acute posthemorrhagic anemia: Secondary | ICD-10-CM | POA: Diagnosis not present

## 2024-03-06 DIAGNOSIS — J9611 Chronic respiratory failure with hypoxia: Secondary | ICD-10-CM

## 2024-03-06 DIAGNOSIS — K922 Gastrointestinal hemorrhage, unspecified: Secondary | ICD-10-CM | POA: Diagnosis not present

## 2024-03-06 DIAGNOSIS — I5032 Chronic diastolic (congestive) heart failure: Secondary | ICD-10-CM

## 2024-03-06 DIAGNOSIS — J439 Emphysema, unspecified: Secondary | ICD-10-CM

## 2024-03-06 LAB — PREPARE RBC (CROSSMATCH)

## 2024-03-06 LAB — SURGICAL PATHOLOGY

## 2024-03-06 LAB — HEMOGLOBIN: Hemoglobin: 7.1 g/dL — ABNORMAL LOW (ref 12.0–15.0)

## 2024-03-06 MED ORDER — SODIUM CHLORIDE 0.9% IV SOLUTION: Freq: Once | INTRAVENOUS | Status: AC

## 2024-03-06 MED ORDER — IRON SUCROSE 300 MG IVPB - SIMPLE MED
300.0000 mg | Freq: Once | Status: AC
  Administered 2024-03-06: 300 mg via INTRAVENOUS
  Filled 2024-03-06: qty 300

## 2024-03-06 MED ORDER — FUROSEMIDE 10 MG/ML IJ SOLN
20.0000 mg | Freq: Once | INTRAMUSCULAR | Status: AC
  Administered 2024-03-06: 20 mg via INTRAVENOUS
  Filled 2024-03-06: qty 4

## 2024-03-06 NOTE — Assessment & Plan Note (Addendum)
 At polyp removal sites

## 2024-03-06 NOTE — Progress Notes (Signed)
 Progress Note   Patient: Theresa [name] MWU:[id number] DOB: July 11, 1956 DOA: 03/02/2024     4 DOS: the patient was seen and examined on 03/06/2024   Brief hospital course: 68 y.o. female with medical history significant of PAF on Xarelto , HTN/HFpEF, COPD Gold stage III, chronic hypoxic respiratory failure on 4-5 L continuously, GERD, anxiety/depression, obesity, presented with worsening of general weakness, exertional dyspnea with intermittent abdominal pain.  She only takes oxycodone  for back pain, does not take NSAIDS or EtOH. No alcohol consumption.   CT abdomen pelvis with contrast showed no acute findings.   Blood work showed hemoglobin 6.6 compared to baseline 11.9 about 6 months ago.   Patient received 2 units of packed red blood cells  5/20 patient had a colonoscopy with polypectomy.  Had moderate bleeding afterwards and then had a flexible sigmoidoscopy with cautery and clip placement.  Continue to hold Xarelto . 5/21.  Will transfuse 1 unit of packed red blood cells and give IV iron  today for hemoglobin of 7.1.  Recheck hemoglobin tomorrow.  Assessment and Plan: * Acute on chronic blood loss anemia Patient had significant bleeding after polypectomy.  Had a repeat sigmoidoscopy withInjection and clipping of polypectomy sites.  With hemoglobin dropping down to 7.1 will give a unit of packed red blood cells today and IV iron  today.  Recheck hemoglobin tomorrow morning.  Diet advanced.  Hold Xarelto .  GI bleed At polyp removal sites  Chronic heart failure with preserved ejection fraction (HCC) No signs of heart failure currently  Chronic hypoxic respiratory failure (HCC) Patient wears 6 L chronically  COPD (chronic obstructive pulmonary disease) (HCC) Respiratory status stable  Paroxysmal atrial fibrillation (HCC) Continue to hold Xarelto  even at discharge.  Had cardioversion on 02/19/2024.  Continue amiodarone .  Class II obesity BMI 38.75        Subjective: No further  bleeding since second procedure yesterday.  Had colonoscopy with polypectomy.  Then had to go back for flexible sigmoidoscopy with injection and clipping of polyp sites.  Physical Exam: Vitals:   03/06/24 0806 03/06/24 1109 03/06/24 1112 03/06/24 1127  BP: 124/63 (!) 133/101 (!) 133/101 125/66  Pulse: 66 70 70 75  Resp: 18  18 17   Temp: 97.7 F (36.5 C) 98 F (36.7 C) 98 F (36.7 C) 98.3 F (36.8 C)  TempSrc:   Oral Oral  SpO2: 100% 100% 100% 100%  Weight:      Height:       Physical Exam HENT:     Head: Normocephalic.     Mouth/Throat:     Pharynx: No oropharyngeal exudate.  Eyes:     General: Lids are normal.     Conjunctiva/sclera: Conjunctivae normal.  Cardiovascular:     Rate and Rhythm: Normal rate and regular rhythm.     Heart sounds: Normal heart sounds, S1 normal and S2 normal.  Pulmonary:     Breath sounds: No decreased breath sounds, wheezing, rhonchi or rales.  Abdominal:     Palpations: Abdomen is soft.     Tenderness: There is no abdominal tenderness.  Musculoskeletal:     Right lower leg: No swelling.     Left lower leg: No swelling.  Skin:    General: Skin is warm.     Findings: No rash.  Neurological:     Mental Status: She is alert and oriented to person, place, and time.     Data Reviewed: Hemoglobin 7.1  Family Communication: Left message for husband  Disposition: Status is: Inpatient Remains  inpatient appropriate because: Transfuse 1 unit of packed red blood cells today and give IV iron .  Planned Discharge Destination: Home    Time spent: 28 minutes Case discussed with gastroenterology  Author: Verla Glaze, MD 03/06/2024 1:16 PM  For on call review [email or web address].

## 2024-03-06 NOTE — Assessment & Plan Note (Signed)
Respiratory status stable. 

## 2024-03-06 NOTE — Hospital Course (Addendum)
 68 y.o. female with medical history significant of PAF on Xarelto , HTN/HFpEF, COPD Gold stage III, chronic hypoxic respiratory failure on 4-5 L continuously, GERD, anxiety/depression, obesity, presented with worsening of general weakness, exertional dyspnea with intermittent abdominal pain.  She only takes oxycodone  for back pain, does not take NSAIDS or EtOH. No alcohol consumption.   CT abdomen pelvis with contrast showed no acute findings.   Blood work showed hemoglobin 6.6 compared to baseline 11.9 about 6 months ago.   Patient received 2 units of packed red blood cells  5/20 patient had a colonoscopy with polypectomy.  Had moderate bleeding afterwards and then had a flexible sigmoidoscopy with cautery and clip placement.  Continue to hold Xarelto . 5/21.  Will transfuse 1 unit of packed red blood cells and give IV iron  today for hemoglobin of 7.1.  Recheck hemoglobin tomorrow. 5/22.  Hemoglobin 8.3.  Stable for discharge home.

## 2024-03-06 NOTE — Progress Notes (Signed)
    Marnee Sink, MD [facility]   [address] [place], [place] [address] Phone: [phone] Fax : [phone]   Subjective: The patient is status post colonoscopy yesterday with the removal of 6 polyps.  The patient was brought back to the endoscopy unit a short time later with a lower GI bleed.  A repeat procedure showed that all of the polypectomy sites found were actively bleeding.  All the polyps were small in size.  These were treated with epinephrine  injection and clipping.  The patient has had no further sign of bleeding overnight   Objective: Vital signs in last 24 hours: Vitals:   03/05/24 1650 03/05/24 1933 03/06/24 0510 03/06/24 0806  BP: 127/66 128/77 (!) 128/99 124/63  Pulse: 79 77 67 66  Resp: 20 16 18 18   Temp: 98.5 F (36.9 C) 99.5 F (37.5 C) 98 F (36.7 C) 97.7 F (36.5 C)  TempSrc:   Oral   SpO2: 99% 100% 100% 100%  Weight:      Height:       Weight change:   Intake/Output Summary (Last 24 hours) at 03/06/2024 1019 Last data filed at 03/05/2024 1214 Gross per 24 hour  Intake 200 ml  Output --  Net 200 ml     Exam: General: Patient in no apparent distress sitting up and alert and oriented x 3   Lab Results: @LABTEST2 @ Micro Results: No results found for this or any previous visit (from the past 240 hours). Studies/Results: No results found. Medications: I have reviewed the patient's current medications. Scheduled Meds:  amiodarone   200 mg Oral Daily   budesonide -glycopyrrolate -formoterol   2 puff Inhalation BID   busPIRone   5 mg Oral BID   dapagliflozin  propanediol  10 mg Oral Daily   diltiazem   180 mg Oral Daily   iron  polysaccharides  150 mg Oral Daily   nystatin   5 mL Oral QID   pantoprazole  (PROTONIX ) IV  40 mg Intravenous Q12H   roflumilast   500 mcg Oral Daily   sertraline   200 mg Oral Daily   sodium chloride  flush  10 mL Intravenous Q12H   spironolactone   12.5 mg Oral Daily   Continuous Infusions: PRN Meds:.acetaminophen ,  diphenhydrAMINE , hydrALAZINE , HYDROcodone -acetaminophen , hydrocortisone  cream, ipratropium-albuterol , ondansetron  **OR** ondansetron  (ZOFRAN ) IV   Assessment: Principal Problem:   GI bleed Active Problems:   Symptomatic anemia   Anemia due to chronic blood loss   Iron  deficiency anemia   Anemia   Polyp of colon    Plan: The patient had bleeding after a colonoscopy yesterday with all the polypectomy sites bleeding and were treated with epinephrine  and clipping of the lesions.  She has had no further bleeding but the drop in hemoglobin is likely from the interval between the 1st and 2nd procedures.  The patient will receive a unit of blood today and I will advance her diet to a soft diet.  The patient should be able to be discharged after the patient's transfusion and rechecking of her hemoglobin tomorrow morning to make sure it is stable.  I will sign off.  Please call if any further GI concerns or questions.  We would like to thank you for the opportunity to participate in the care of Georgiann Kirsch.    LOS: 4 days   Marnee Sink, MD.FACG 03/06/2024, 10:19 AM Pager [phone] 7am-5pm  Check AMION for 5pm -7am coverage and on weekends

## 2024-03-06 NOTE — Plan of Care (Signed)

## 2024-03-06 NOTE — Assessment & Plan Note (Signed)
No signs of heart failure currently

## 2024-03-06 NOTE — Care Management Important Message (Signed)
 Important Message  Patient Details  Name: Theresa [name] MRN: [id number] Date of Birth: 1956-01-13   Important Message Given:  Yes - Medicare IM     Tishina Lown W, CMA 03/06/2024, 1:22 PM

## 2024-03-06 NOTE — Assessment & Plan Note (Signed)
 Patient wears 6 L chronically

## 2024-03-06 NOTE — Assessment & Plan Note (Addendum)
 Patient had significant bleeding after polypectomy.  Had a repeat sigmoidoscopy with Injection and clipping of polypectomy sites.  Patient received 3 units of packed red blood cells during the hospital course, we also gave IV iron .   Hold Xarelto  even upon discharge.

## 2024-03-06 NOTE — Assessment & Plan Note (Addendum)
 Continue to hold Xarelto  even at discharge, risk of stroke is higher not being on blood thinner.  Had cardioversion on 02/19/2024.  Continue amiodarone .  Patient to look into Watchman procedure.

## 2024-03-06 NOTE — Assessment & Plan Note (Signed)
 BMI 38.75

## 2024-03-06 NOTE — Progress Notes (Signed)
 [facility] CLINIC CARDIOLOGY PROGRESS NOTE       Patient ID: Theresa [name] MRN: [id number] DOB/AGE: May 18, 1956 68 y.o.  Admit date: 03/02/2024 Referring Physician Melvinia Stager Primary Physician Tawnya Fava, Waddell Guess, MD Primary Cardiologist Dr. Beau Bound Reason for Consultation POC  HPI: Theresa [name] is a 68 y.o. female  with a past medical history of chronic diastolic heart failure, severe tricuspid regurgitation, pulmonary hypertension, atrial fibrillation (on Xarelto ) s/p DCCV (02/19/24), COPD, chronic respiratory failure with hypoxia (6L at home), hx epistasis, hx frequent falls who presented to the ED on 03/02/2024 after being sent by PCP with abnormal labs. Patient scheduled for Endoscopy/colonoscopy tomorrow and required cardiac evaluation. Cardiology was consulted for further evaluation.   Interval History: -Patient seen and examined this AM and sitting up in hospital bed in no acute distress. Patient states she feels good this AM and denies chest pain, palpitations or SOB.  -Patients BP and HR stable this AM. Not on tele. -Patient remains on 5L with stable SpO2.  -s/p EGD/colonoscopy (05/20) with polypectomy. Hgb dropped to 7.1 Received 1 unit PRBCs today.   Review of systems complete and found to be negative unless listed above    Past Medical History:  Diagnosis Date   Acute postoperative pain 10/03/2017   Anxiety    BiPAP (biphasic positive airway pressure) dependence    at hs   Carpal tunnel syndrome    CHF (congestive heart failure) (HCC)    1/18   CHF (congestive heart failure) (HCC)    Chronic generalized abdominal pain    Chronic rhinitis    COPD (chronic obstructive pulmonary disease) (HCC)    DDD (degenerative disc disease), cervical    DDD (degenerative disc disease), lumbosacral    Depression    Dyspnea    Edema    Flu    1/18   Gastritis    GERD (gastroesophageal reflux disease)    Hematuria    Hernia of abdominal wall 07/17/2018   Hernia,  abdominal    Hypertension    Kidney stones    Low back pain    Lumbar radiculitis    Malodorous urine    Muscle weakness    Obesity    Oxygen  dependent    Pneumonia due to COVID-19 virus 02/03/2020   Renal cyst    Sensory urge incontinence    Thyroid  activity decreased    9/19   Tobacco abuse    Wheezing     Past Surgical History:  Procedure Laterality Date   ABDOMINAL HYSTERECTOMY     CARDIOVERSION N/A 04/29/2022   Procedure: CARDIOVERSION;  Surgeon: Michelle Aid, MD;  Location: [facility] ORS;  Service: Cardiovascular;  Laterality: N/A;   CARDIOVERSION N/A 01/30/2024   Procedure: CARDIOVERSION;  Surgeon: Antonette Batters, MD;  Location: [facility] ORS;  Service: Cardiovascular;  Laterality: N/A;   CARDIOVERSION N/A 02/19/2024   Procedure: CARDIOVERSION;  Surgeon: Antonette Batters, MD;  Location: [facility] ORS;  Service: Cardiovascular;  Laterality: N/A;   CARPAL TUNNEL RELEASE Left 2012   cataract surgery Bilateral    COLONOSCOPY N/A 03/05/2024   Procedure: COLONOSCOPY;  Surgeon: Marnee Sink, MD;  Location: [facility] ENDOSCOPY;  Service: Endoscopy;  Laterality: N/A;   EPIGASTRIC HERNIA REPAIR N/A 08/13/2018   HERNIA REPAIR EPIGASTRIC ADULT; polypropylene mesh reinforcement.  Surgeon: Marshall Skeeter, MD;  Location: [facility] ORS;  Service: General;  Laterality: N/A;   ESOPHAGOGASTRODUODENOSCOPY N/A 06/21/2022   Procedure: ESOPHAGOGASTRODUODENOSCOPY (EGD);  Surgeon: Toledo, Alphonsus Jeans, MD;  Location: [facility] ENDOSCOPY;  Service: Gastroenterology;  Laterality: N/A;   ESOPHAGOGASTRODUODENOSCOPY N/A 03/05/2024   Procedure: EGD (ESOPHAGOGASTRODUODENOSCOPY);  Surgeon: Marnee Sink, MD;  Location: [facility] ENDOSCOPY;  Service: Endoscopy;  Laterality: N/A;   FLEXIBLE SIGMOIDOSCOPY N/A 03/05/2024   Procedure: Marlynn Singer;  Surgeon: Marnee Sink, MD;  Location: [facility] ENDOSCOPY;  Service: Endoscopy;  Laterality: N/A;   POLYPECTOMY  03/05/2024   Procedure: POLYPECTOMY, INTESTINE;  Surgeon: Marnee Sink,  MD;  Location: [facility] ENDOSCOPY;  Service: Endoscopy;;   RIGHT/LEFT HEART CATH AND CORONARY ANGIOGRAPHY N/A 07/11/2019   Procedure: RIGHT/LEFT HEART CATH AND CORONARY ANGIOGRAPHY;  Surgeon: Antonette Batters, MD;  Location: [facility] INVASIVE CV LAB;  Service: Cardiovascular;  Laterality: N/A;   TEE WITHOUT CARDIOVERSION N/A 04/29/2022   Procedure: TRANSESOPHAGEAL ECHOCARDIOGRAM (TEE);  Surgeon: Michelle Aid, MD;  Location: [facility] ORS;  Service: Cardiovascular;  Laterality: N/A;   TONSILLECTOMY     TUBAL LIGATION     UMBILICAL HERNIA REPAIR N/A 08/13/2018   HERNIA REPAIR UMBILICAL ADULT; polypropylene mesh reinforcement Surgeon: Marshall Skeeter, MD;  Location: [facility] ORS;  Service: General;  Laterality: N/A;    Medications Prior to Admission  Medication Sig Dispense Refill Last Dose/Taking   acetaminophen  (TYLENOL ) 500 MG tablet Take 1,000 mg by mouth every 8 (eight) hours as needed for mild pain or headache.   Past Month   albuterol  (VENTOLIN  HFA) 108 (90 Base) MCG/ACT inhaler Inhale 2 puffs into the lungs every 6 (six) hours as needed for wheezing or shortness of breath. 8 g 1 03/01/2024   amiodarone  (PACERONE ) 200 MG tablet Take 1 tablet (200 mg total) by mouth daily. 30 tablet 0 03/02/2024 Morning   busPIRone  (BUSPAR ) 5 MG tablet Take 5 mg by mouth 2 (two) times daily.   03/02/2024 Morning   Cholecalciferol  50 MCG (2000 UT) CAPS Take 2,000 Units by mouth daily.   Past Month   dapagliflozin  propanediol (FARXIGA ) 10 MG TABS tablet Take 1 tablet (10 mg total) by mouth daily. 90 tablet 3 03/02/2024 Morning   diltiazem  (CARDIZEM  CD) 180 MG 24 hr capsule Take 180 mg by mouth daily.   03/02/2024 Morning   Fluticasone -Umeclidin-Vilant (TRELEGY ELLIPTA) 100-62.5-25 MCG/ACT AEPB Inhale 1 puff into the lungs daily.   03/02/2024 Morning   HYDROcodone -acetaminophen  (NORCO/VICODIN) 5-325 MG tablet Take 1 tablet by mouth every 8 (eight) hours as needed for severe pain (pain score 7-10). Must last 30 days 90 tablet  0 03/02/2024 Morning   ipratropium-albuterol  (DUONEB) 0.5-2.5 (3) MG/3ML SOLN USE 1 VIAL VIA NEBULIZER EVERY 6 HOURS AS NEEDED FOR SHORTNESS OF BREATH OR WHEEZING 1080 mL 0 Past Month   naloxone  (NARCAN ) nasal spray 4 mg/0.1 mL Place 1 spray into the nose as needed for up to 365 doses (for opioid-induced respiratory depresssion). In case of emergency (overdose), spray once into each nostril. If no response within 3 minutes, repeat application and call 911. 1 each 0 Taking As Needed   nystatin  (MYCOSTATIN ) 100000 UNIT/ML suspension Take 5 mLs by mouth 4 (four) times daily.   Past Week   omeprazole  (PRILOSEC) 40 MG capsule Take 40 mg by mouth daily.   03/02/2024 Morning   potassium chloride  SA (KLOR-CON  M) 20 MEQ tablet Take 2 tablets every morning and 1 tablet every evening 270 tablet 3 03/02/2024 Morning   Rivaroxaban  (XARELTO ) 15 MG TABS tablet Take 15 mg by mouth daily with supper.   03/02/2024 Morning   roflumilast  (DALIRESP ) 500 MCG TABS tablet Take 500 mcg by mouth daily.   03/02/2024 Morning   sertraline  (  ZOLOFT ) 100 MG tablet Take 2 tablets (200 mg total) by mouth daily. Please schedule office visit before any future refill. 60 tablet 0 03/02/2024 Morning   spironolactone  (ALDACTONE ) 25 MG tablet Take 0.5 tablets (12.5 mg total) by mouth daily. 45 tablet 3 03/02/2024 Morning   tirzepatide (MOUNJARO) 12.5 MG/0.5ML Pen Inject 12.5 mg into the skin once a week. 2 mL 0 02/29/2024   torsemide  (DEMADEX ) 20 MG tablet Take 40-80 mg by mouth See admin instructions. Take 4 tablets (80mg ) by mouth every morning and take 2 tablets (40mg ) by mouth every afternoon   03/02/2024 Morning   FUROSCIX  80 MG/10ML CTKT Inject 80 mg into the skin daily as needed (If gain over 3x's weight in fluid).      HYDROcodone -acetaminophen  (NORCO/VICODIN) 5-325 MG tablet Take 1 tablet by mouth every 8 (eight) hours as needed for severe pain (pain score 7-10). Must last 30 days 90 tablet 0    [START ON 03/26/2024] HYDROcodone -acetaminophen   (NORCO/VICODIN) 5-325 MG tablet Take 1 tablet by mouth every 8 (eight) hours as needed for severe pain (pain score 7-10). Must last 30 days 90 tablet 0    OXYGEN  Inhale 5 L into the lungs continuous.      Social History   Socioeconomic History   Marital status: Married    Spouse name: Elana Grayer   Number of children: 2   Years of education: Not on file   Highest education level: 12th grade  Occupational History   Occupation: disability  Tobacco Use   Smoking status: Former    Current packs/day: 1.50    Average packs/day: 1.5 packs/day for 44.0 years (66.0 ttl pk-yrs)    Types: Cigarettes   Smokeless tobacco: Never   Tobacco comments:    Stopped 4 weeks ago May 20, 2023  Vaping Use   Vaping status: Never Used  Substance and Sexual Activity   Alcohol use: No   Drug use: Yes    Comment: prescribed hydrocodone    Sexual activity: Not Currently    Birth control/protection: Surgical  Other Topics Concern   Not on file  Social History Narrative   ** Merged History Encounter **       Lives with spouse    Social Drivers of Health   Financial Resource Strain: Medium Risk (02/26/2024)   Received from [facility]   Overall Financial Resource Strain (CARDIA)    Difficulty of Paying Living Expenses: Somewhat hard  Food Insecurity: No Food Insecurity (03/02/2024)   Hunger Vital Sign    Worried About Running Out of Food in the Last Year: Never true    Ran Out of Food in the Last Year: Never true  Recent Concern: Food Insecurity - Food Insecurity Present (02/26/2024)   Received from [facility]   Hunger Vital Sign    Worried About Running Out of Food in the Last Year: Sometimes true    Ran Out of Food in the Last Year: Often true  Transportation Needs: No Transportation Needs (03/02/2024)   PRAPARE - Administrator, Civil Service (Medical): No    Lack of Transportation (Non-Medical): No  Physical Activity: Inactive (08/24/2022)   Received  from [facility], [facility]   Exercise Vital Sign    Days of Exercise per Week: 0 days    Minutes of Exercise per Session: 0 min  Stress: Stress Concern Present (08/24/2022)   Received from [facility], [facility]  [employer] of Occupational Health - Occupational Stress Questionnaire    Feeling of Stress : Very much  Social Connections: Moderately Integrated (03/02/2024)   Social Connection and Isolation Panel [NHANES]    Frequency of Communication with Friends and Family: More than three times a week    Frequency of Social Gatherings with Friends and Family: Once a week    Attends Religious Services: More than 4 times per year    Active Member of Clubs or Organizations: No    Attends Banker Meetings: Never    Marital Status: Married  Catering manager Violence: Not At Risk (03/02/2024)   Humiliation, Afraid, Rape, and Kick questionnaire    Fear of Current or Ex-Partner: No    Emotionally Abused: No    Physically Abused: No    Sexually Abused: No    Family History  Problem Relation Age of Onset   Heart disease Mother    Stroke Mother    Coronary artery disease Mother    Alcohol abuse Father    Heart disease Father    Lung cancer Sister    Cancer Sister    Breast cancer Sister    Cancer Brother    Cancer Brother    Kidney cancer Brother    Pneumonia Brother    Prostate cancer Neg Hx    Bladder Cancer Neg Hx      Vitals:   03/06/24 1109 03/06/24 1112 03/06/24 1127 03/06/24 1412  BP: (!) 133/101 (!) 133/101 125/66 113/62  Pulse: 70 70 75 78  Resp:  18 17 17   Temp: 98 F (36.7 C) 98 F (36.7 C) 98.3 F (36.8 C) 98.5 F (36.9 C)  TempSrc:  Oral Oral Oral  SpO2: 100% 100% 100% 100%  Weight:      Height:        PHYSICAL EXAM General: chronically ill appearing elderly female, well nourished, in no acute distress. HEENT: Normocephalic and atraumatic. Neck: No JVD.   Lungs:  Normal respiratory effort on 6L. Clear bilaterally to auscultation. No wheezes, crackles, rhonchi.  Heart: HRRR. Normal S1 and S2 without gallops or murmurs.  Abdomen: Non-distended appearing.  Msk: Normal strength and tone for age. Extremities: Warm and well perfused. No clubbing, cyanosis. No edema.  Neuro: Alert and oriented X 3. Psych: Answers questions appropriately.   Labs: Basic Metabolic Panel: Recent Labs    03/05/24 0438  NA 140  K 3.4*  CL 106  CO2 28  GLUCOSE 85  BUN 8  CREATININE 0.68  CALCIUM  9.0   Liver Function Tests: No results for input(s): "AST", "ALT", "ALKPHOS", "BILITOT", "PROT", "ALBUMIN" in the last 72 hours.  No results for input(s): "LIPASE", "AMYLASE" in the last 72 hours. CBC: Recent Labs    03/05/24 0438 03/06/24 0412  WBC 8.0  --   HGB 8.4* 7.1*  HCT 30.6*  --   MCV 79.5*  --   PLT 286  --    Cardiac Enzymes: No results for input(s): "CKTOTAL", "CKMB", "CKMBINDEX", "TROPONINIHS" in the last 72 hours. BNP: No results for input(s): "BNP" in the last 72 hours. D-Dimer: No results for input(s): "DDIMER" in the last 72 hours. Hemoglobin A1C: No results for input(s): "HGBA1C" in the last 72 hours. Fasting Lipid Panel: No results for input(s): "CHOL", "HDL", "LDLCALC", "TRIG", "CHOLHDL", "LDLDIRECT" in the last 72 hours. Thyroid  Function Tests: No results for input(s): "TSH", "T4TOTAL", "T3FREE", "THYROIDAB" in the last 72 hours.  Invalid input(s): "FREET3" Anemia Panel: No results for input(s): "VITAMINB12", "  FOLATE", "FERRITIN", "TIBC", "IRON ", "RETICCTPCT" in the last 72 hours.    Radiology: DG Chest 1 View Result Date: 03/02/2024 CLINICAL DATA:  Congestive heart failure.  Dizziness. EXAM: CHEST  1 VIEW COMPARISON:  Radiograph 08/29/2023. Lung bases from abdominopelvic CT earlier today FINDINGS: The heart is mildly enlarged, stable. Unchanged peribronchial thickening. Subsegmental atelectasis in the lung bases. No significant pleural  effusion. No pneumothorax. IMPRESSION: 1. Unchanged peribronchial thickening, bronchitic versus congestive. Mild cardiomegaly is unchanged. 2. Subsegmental atelectasis in the lung bases. Electronically Signed   By: Chadwick Colonel M.D.   On: 03/02/2024 13:06   CT ABDOMEN PELVIS W CONTRAST Result Date: 03/02/2024 CLINICAL DATA:  Blunt abdominal trauma EXAM: CT ABDOMEN AND PELVIS WITH CONTRAST TECHNIQUE: Multidetector CT imaging of the abdomen and pelvis was performed using the standard protocol following bolus administration of intravenous contrast. RADIATION DOSE REDUCTION: This exam was performed according to the departmental dose-optimization program which includes automated exposure control, adjustment of the mA and/or kV according to patient size and/or use of iterative reconstruction technique. CONTRAST:  OMNIPAQUE  IOHEXOL  300 MG/ML  SOLN COMPARISON:  06/19/2022 FINDINGS: Lower chest: No acute pleural or parenchymal lung disease. Hepatobiliary: Scattered hepatic cysts are again noted. No other focal liver abnormalities. Gallbladder is unremarkable. No biliary duct dilation. Pancreas: Unremarkable. No pancreatic ductal dilatation or surrounding inflammatory changes. Spleen: Normal in size without focal abnormality. Adrenals/Urinary Tract: Stable appearance of the adrenals. Nonobstructing 6 mm calculus lower pole left kidney. Right kidney is unremarkable. Bladder is moderately distended without focal abnormality. Stomach/Bowel: No bowel obstruction or ileus. Normal appendix right lower quadrant. No bowel wall thickening or inflammatory change. Vascular/Lymphatic: Aortic atherosclerosis. No enlarged abdominal or pelvic lymph nodes. Reproductive: Uterus and bilateral adnexa are unremarkable. Other: No free fluid or free intraperitoneal gas. No abdominal wall hernia. Musculoskeletal: No acute or destructive bony abnormalities. Reconstructed images demonstrate no additional findings. IMPRESSION: 1. No acute  intra-abdominal or intrapelvic trauma. 2. Nonobstructing 6 mm left renal calculus. 3.  Aortic Atherosclerosis (ICD10-I70.0). Electronically Signed   By: Bobbye Burrow M.D.   On: 03/02/2024 12:17    ECHO 02/19/24 NORMAL LEFT VENTRICULAR SYSTOLIC FUNCTION WITH NO LVH  ESTIMATED EF: >55%  NORMAL LA PRESSURES WITH NORMAL DIASTOLIC FUNCTION  NORMAL RIGHT VENTRICULAR SYSTOLIC FUNCTION  VALVULAR REGURGITATION: MILD AR, MILD MR, TRIVIAL PR, MODERATE TR  NO VALVULAR STENOSIS   TELEMETRY reviewed by me 03/06/2024: not on tele  EKG reviewed by me: sinus rhythm, rate 72 bpm  Data reviewed by me 03/06/2024: last 24h vitals tele labs imaging I/O hospitalist progress notes, GI notes  Principal Problem:   Acute on chronic blood loss anemia Active Problems:   Chronic hypoxic respiratory failure (HCC)   Chronic heart failure with preserved ejection fraction (HCC)   COPD (chronic obstructive pulmonary disease) (HCC)   Anemia due to chronic blood loss   GI bleed   Iron  deficiency anemia   Polyp of colon   Paroxysmal atrial fibrillation (HCC)   Class II obesity    ASSESSMENT AND PLAN:  Theresa [name] is a 68 y.o. female  with a past medical history of chronic diastolic heart failure, severe tricuspid regurgitation, pulmonary hypertension, atrial fibrillation (on Xarelto ) s/p DCCV (02/19/24), COPD, chronic respiratory failure with hypoxia (6L at home), hx epistasis, hx frequent falls who presented to the ED on 03/02/2024 after being sent by PCP with abnormal labs. Patient scheduled for Endoscopy/colonoscopy tomorrow and required cardiac evaluation. Cardiology was consulted for further evaluation.    # Preoperative  Cardiac Evaluation # HX epistasis # Anemia # Paroxsymal Atrial Fibrillation s/p DCCV (02/19/24) # Chronic HFpEF # COPD with chronic hypoxic respiratory failure Patient sent by PCP due to low blood count at 6.6. Patient received 1 unit of PRBCs, Hgb improved from 6.6 to 7.4. This AM Hgb  6.9. Patient undegroing EGD/Colonoscopy on Tuesday and requiring preop cardiac evaluation. Euvolemic on exam. Echo from 05/05 with preserved EF. HR and BP stable this AM. EKG with sinus rhythm, rate 72 bpm. s/p EGD/colonoscopy (05/20) with polypectomy. Hgb dropped to 7.1 Received 1 unit PRBCs today. -Home Xarelto  held due to low Hgb. Continue to hold Xarelto  as patient is high risk for bleeding with history of recurrent epistasis and low Hgb during this admission. -Continue amiodarone  200 mg daily. -Continue home dapaglilozin 10 mg, spironolactone  25 mg, diltiazem  180 mg daily. -Discussions outpatient about possible Watchman with Dr. Andy Bannister.  -EGD/colonoscopy scheduled for today at 11:30 AM. -Will continue to follow patient until discharge.  This patient's plan of care was discussed and created with Dr. Beau Bound and he is in agreement.  Signed: Creighton Doffing, PA-C  03/06/2024, 3:04 PM [facility] Cardiology

## 2024-03-07 DIAGNOSIS — D62 Acute posthemorrhagic anemia: Secondary | ICD-10-CM | POA: Diagnosis not present

## 2024-03-07 DIAGNOSIS — D508 Other iron deficiency anemias: Secondary | ICD-10-CM

## 2024-03-07 DIAGNOSIS — I5032 Chronic diastolic (congestive) heart failure: Secondary | ICD-10-CM | POA: Diagnosis not present

## 2024-03-07 DIAGNOSIS — K922 Gastrointestinal hemorrhage, unspecified: Secondary | ICD-10-CM | POA: Diagnosis not present

## 2024-03-07 DIAGNOSIS — J9611 Chronic respiratory failure with hypoxia: Secondary | ICD-10-CM | POA: Diagnosis not present

## 2024-03-07 LAB — BPAM RBC
Blood Product Expiration Date: 202506252359
ISSUE DATE / TIME: 202505211101
Unit Type and Rh: 6200

## 2024-03-07 LAB — HEMOGLOBIN: Hemoglobin: 8.3 g/dL — ABNORMAL LOW (ref 12.0–15.0)

## 2024-03-07 LAB — TYPE AND SCREEN
ABO/RH(D): A POS
Antibody Screen: NEGATIVE
Unit division: 0

## 2024-03-07 LAB — BASIC METABOLIC PANEL WITH GFR
Anion gap: 7 (ref 5–15)
BUN: 9 mg/dL (ref 8–23)
CO2: 26 mmol/L (ref 22–32)
Calcium: 8.8 mg/dL — ABNORMAL LOW (ref 8.9–10.3)
Chloride: 108 mmol/L (ref 98–111)
Creatinine, Ser: 0.74 mg/dL (ref 0.44–1.00)
GFR, Estimated: >60 mL/min (ref >60)
Glucose, Bld: 95 mg/dL (ref 70–99)
Potassium: 3.1 mmol/L — ABNORMAL LOW (ref 3.5–5.1)
Sodium: 141 mmol/L (ref 135–145)

## 2024-03-07 MED ORDER — POTASSIUM CHLORIDE CRYS ER 20 MEQ PO TBCR
40.0000 meq | EXTENDED_RELEASE_TABLET | Freq: Once | ORAL | Status: AC
  Administered 2024-03-07: 40 meq via ORAL
  Filled 2024-03-07: qty 2

## 2024-03-07 NOTE — TOC Transition Note (Signed)
 Transition of Care [facility]) - Discharge Note   Patient Details  Name: Theresa [name] MRN: [id number] Date of Birth: 04/04/1956  Transition of Care [facility]) CM/SW Contact:  Alexandra Ice, RN Phone Number: 03/07/2024, 8:19 AM   Clinical Narrative:     Patient had discharge order in place. Patient lives at home with spouse. She has PCP on file. No TOC need identified, will continue to monitor if need should arise.    Final next level of care: Home/Self Care Barriers to Discharge: Barriers Resolved   Patient Goals and CMS Choice Patient states their goals for this hospitalization and ongoing recovery are:: feel better          Discharge Placement                  Name of family member notified: Spouse - Sammie Crigler Patient and family notified of of transfer: 03/07/24  Discharge Plan and Services Additional resources added to the After Visit Summary for                    DME Agency: NA       HH Arranged: NA          Social Drivers of Health (SDOH) Interventions SDOH Screenings   Food Insecurity: No Food Insecurity (03/02/2024)  Recent Concern: Food Insecurity - Food Insecurity Present (02/26/2024)   Received from [facility]  Housing: Unknown (03/02/2024)  Transportation Needs: No Transportation Needs (03/02/2024)  Utilities: Not At Risk (03/02/2024)  Alcohol Screen: Low Risk  (09/02/2022)  Depression (PHQ2-9): Low Risk  (01/24/2024)  Financial Resource Strain: Medium Risk (02/26/2024)   Received from [facility]  Physical Activity: Inactive (08/24/2022)   Received from [facility], [facility]  Social Connections: Moderately Integrated (03/02/2024)  Stress: Stress Concern Present (08/24/2022)   Received from [facility], [facility]  Tobacco Use: Medium Risk (03/05/2024)     Readmission Risk Interventions    06/21/2022   12:54 PM 08/09/2021   10:43 AM   Readmission Risk Prevention Plan  Transportation Screening Complete Complete  PCP or Specialist Appt within 3-5 Days  Complete  HRI or Home Care Consult  Complete  Social Work Consult for Recovery Care Planning/Counseling  Complete  Palliative Care Screening  Not Applicable  Medication Review Ocean[facility]) Complete Complete  PCP or Specialist appointment within 3-5 days of discharge Complete   SW Recovery Care/Counseling Consult Complete   Palliative Care Screening Not Applicable   Skilled Nursing Facility Not Applicable

## 2024-03-07 NOTE — Discharge Summary (Signed)
 Physician Discharge Summary   Patient: Theresa [name] MRN: [id number] DOB: [birth year]  Admit date:     03/02/2024  Discharge date: 03/07/24  Discharge Physician: Verla Glaze   PCP: Tawnya Fava, Waddell Guess, MD   Recommendations at discharge:   Follow-up PCP 5 days Follow-up cardiology 2 weeks Refer to hematology as outpatient  Discharge Diagnoses: Principal Problem:   Acute on chronic blood loss anemia Active Problems:   GI bleed   Chronic hypoxic respiratory failure (HCC)   Chronic heart failure with preserved ejection fraction (HCC)   COPD (chronic obstructive pulmonary disease) (HCC)   Paroxysmal atrial fibrillation (HCC)   Class II obesity   Anemia due to chronic blood loss   Iron  deficiency anemia   Polyp of colon   Hospital Course: 68 y.o. female with medical history significant of PAF on Xarelto , HTN/HFpEF, COPD Gold stage III, chronic hypoxic respiratory failure on 4-5 L continuously, GERD, anxiety/depression, obesity, presented with worsening of general weakness, exertional dyspnea with intermittent abdominal pain.  She only takes oxycodone  for back pain, does not take NSAIDS or EtOH. No alcohol consumption.   CT abdomen pelvis with contrast showed no acute findings.   Blood work showed hemoglobin 6.6 compared to baseline 11.9 about 6 months ago.   Patient received 2 units of packed red blood cells  5/20 patient had a colonoscopy with polypectomy.  Had moderate bleeding afterwards and then had a flexible sigmoidoscopy with cautery and clip placement.  Continue to hold Xarelto . 5/21.  Will transfuse 1 unit of packed red blood cells and give IV iron  today for hemoglobin of 7.1.  Recheck hemoglobin tomorrow. 5/22.  Hemoglobin 8.3.  Stable for discharge home.  Assessment and Plan: * Acute on chronic blood loss anemia Patient had significant bleeding after polypectomy.  Had a repeat sigmoidoscopy with Injection and clipping of polypectomy sites.  Patient  received 3 units of packed red blood cells during the hospital course, we also gave IV iron .   Hold Xarelto  even upon discharge.  GI bleed At polyp removal sites  Chronic heart failure with preserved ejection fraction (HCC) No signs of heart failure currently  Chronic hypoxic respiratory failure (HCC) Patient wears 6 L chronically  COPD (chronic obstructive pulmonary disease) (HCC) Respiratory status stable  Paroxysmal atrial fibrillation (HCC) Continue to hold Xarelto  even at discharge, risk of stroke is higher not being on blood thinner.  Had cardioversion on 02/19/2024.  Continue amiodarone .  Patient to look into Watchman procedure.  Class II obesity BMI 38.75         Consultants: Gastroenterology Procedures performed: EGD, colonoscopy with polypectomy, flexible sigmoidoscopy with injection and clipping of polypectomy sites Disposition: Home Diet recommendation:  Cardiac diet DISCHARGE MEDICATION: Allergies as of 03/07/2024       Reactions   Gabapentin  Other (See Comments)   Higher doses of 800 mg, 600 mg, 300 mg causes excessive sedation and contributes to numerous mechanical falls. (Unsure if 100 mg dose can be tolerated)   Codeine Nausea And Vomiting   Meloxicam  Other (See Comments)   Stomach pain        Medication List     STOP taking these medications    Rivaroxaban  15 MG Tabs tablet Commonly known as: XARELTO        TAKE these medications    acetaminophen  500 MG tablet Commonly known as: TYLENOL  Take 1,000 mg by mouth every 8 (eight) hours as needed for mild pain or headache.   albuterol  108 (90 Base) MCG/ACT inhaler  Commonly known as: VENTOLIN  HFA Inhale 2 puffs into the lungs every 6 (six) hours as needed for wheezing or shortness of breath.   amiodarone  200 MG tablet Commonly known as: PACERONE  Take 1 tablet (200 mg total) by mouth daily.   busPIRone  5 MG tablet Commonly known as: BUSPAR  Take 5 mg by mouth 2 (two) times daily.    Cholecalciferol  50 MCG (2000 UT) Caps Take 2,000 Units by mouth daily.   dapagliflozin  propanediol 10 MG Tabs tablet Commonly known as: Farxiga  Take 1 tablet (10 mg total) by mouth daily.   diltiazem  180 MG 24 hr capsule Commonly known as: CARDIZEM  CD Take 180 mg by mouth daily.   Furoscix  80 MG/10ML Ctkt Generic drug: Furosemide  Inject 80 mg into the skin daily as needed (If gain over 3x's weight in fluid).   HYDROcodone -acetaminophen  5-325 MG tablet Commonly known as: NORCO/VICODIN Take 1 tablet by mouth every 8 (eight) hours as needed for severe pain (pain score 7-10). Must last 30 days What changed: Another medication with the same name was removed. Continue taking this medication, and follow the directions you see here.   ipratropium-albuterol  0.5-2.5 (3) MG/3ML Soln Commonly known as: DUONEB USE 1 VIAL VIA NEBULIZER EVERY 6 HOURS AS NEEDED FOR SHORTNESS OF BREATH OR WHEEZING   iron  polysaccharides 150 MG capsule Commonly known as: NIFEREX Take 1 capsule (150 mg total) by mouth daily.   Mounjaro 12.5 MG/0.5ML Pen Generic drug: tirzepatide Inject 12.5 mg into the skin once a week.   naloxone  4 MG/0.1ML Liqd nasal spray kit Commonly known as: NARCAN  Place 1 spray into the nose as needed for up to 365 doses (for opioid-induced respiratory depresssion). In case of emergency (overdose), spray once into each nostril. If no response within 3 minutes, repeat application and call 911.   nystatin  100000 UNIT/ML suspension Commonly known as: MYCOSTATIN  Take 5 mLs by mouth 4 (four) times daily.   omeprazole  40 MG capsule Commonly known as: PRILOSEC Take 40 mg by mouth daily.   OXYGEN  Inhale 5 L into the lungs continuous.   potassium chloride  SA 20 MEQ tablet Commonly known as: KLOR-CON  M Take 2 tablets every morning and 1 tablet every evening   roflumilast  500 MCG Tabs tablet Commonly known as: DALIRESP  Take 500 mcg by mouth daily.   sertraline  100 MG  tablet Commonly known as: ZOLOFT  Take 2 tablets (200 mg total) by mouth daily. Please schedule office visit before any future refill.   spironolactone  25 MG tablet Commonly known as: ALDACTONE  Take 0.5 tablets (12.5 mg total) by mouth daily.   torsemide  20 MG tablet Commonly known as: DEMADEX  Take 40-80 mg by mouth See admin instructions. Take 4 tablets (80mg ) by mouth every morning and take 2 tablets (40mg ) by mouth every afternoon   Trelegy Ellipta 100-62.5-25 MCG/ACT Aepb Generic drug: Fluticasone -Umeclidin-Vilant Inhale 1 puff into the lungs daily.        Follow-up Information     Antonette Batters, MD. Go in 1 week(s).   Specialties: Cardiology, Internal Medicine Contact information: [address] [place] [address] [phone]         Tawnya Fava, Waddell Guess, MD. Schedule an appointment as soon as possible for a visit in 1 week(s).   Specialty: Family Medicine Contact information: [address] [place] [address] [phone]         Timmy Forbes, MD Follow up in 1 week(s).   Specialty: Oncology Contact information: [address] [place] [address] [phone]  Discharge Exam: Filed Weights   03/02/24 1644  Weight: 96.1 kg   Physical Exam HENT:     Head: Normocephalic.  Eyes:     General: Lids are normal.     Conjunctiva/sclera: Conjunctivae normal.  Cardiovascular:     Rate and Rhythm: Normal rate and regular rhythm.     Heart sounds: Normal heart sounds, S1 normal and S2 normal.  Pulmonary:     Breath sounds: Examination of the right-lower field reveals decreased breath sounds. Examination of the left-lower field reveals decreased breath sounds. Decreased breath sounds present. No wheezing, rhonchi or rales.  Abdominal:     Palpations: Abdomen is soft.     Tenderness: There is no abdominal tenderness.  Musculoskeletal:     Right lower leg: No swelling.     Left lower leg: No swelling.  Skin:     General: Skin is warm.     Findings: No rash.  Neurological:     Mental Status: She is alert and oriented to person, place, and time.      Condition at discharge: stable  The results of significant diagnostics from this hospitalization (including imaging, microbiology, ancillary and laboratory) are listed below for reference.   Imaging Studies: DG Chest 1 View Result Date: 03/02/2024 CLINICAL DATA:  Congestive heart failure.  Dizziness. EXAM: CHEST  1 VIEW COMPARISON:  Radiograph 08/29/2023. Lung bases from abdominopelvic CT earlier today FINDINGS: The heart is mildly enlarged, stable. Unchanged peribronchial thickening. Subsegmental atelectasis in the lung bases. No significant pleural effusion. No pneumothorax. IMPRESSION: 1. Unchanged peribronchial thickening, bronchitic versus congestive. Mild cardiomegaly is unchanged. 2. Subsegmental atelectasis in the lung bases. Electronically Signed   By: Chadwick Colonel M.D.   On: 03/02/2024 13:06   CT ABDOMEN PELVIS W CONTRAST Result Date: 03/02/2024 CLINICAL DATA:  Blunt abdominal trauma EXAM: CT ABDOMEN AND PELVIS WITH CONTRAST TECHNIQUE: Multidetector CT imaging of the abdomen and pelvis was performed using the standard protocol following bolus administration of intravenous contrast. RADIATION DOSE REDUCTION: This exam was performed according to the departmental dose-optimization program which includes automated exposure control, adjustment of the mA and/or kV according to patient size and/or use of iterative reconstruction technique. CONTRAST:  OMNIPAQUE  IOHEXOL  300 MG/ML  SOLN COMPARISON:  06/19/2022 FINDINGS: Lower chest: No acute pleural or parenchymal lung disease. Hepatobiliary: Scattered hepatic cysts are again noted. No other focal liver abnormalities. Gallbladder is unremarkable. No biliary duct dilation. Pancreas: Unremarkable. No pancreatic ductal dilatation or surrounding inflammatory changes. Spleen: Normal in size without focal  abnormality. Adrenals/Urinary Tract: Stable appearance of the adrenals. Nonobstructing 6 mm calculus lower pole left kidney. Right kidney is unremarkable. Bladder is moderately distended without focal abnormality. Stomach/Bowel: No bowel obstruction or ileus. Normal appendix right lower quadrant. No bowel wall thickening or inflammatory change. Vascular/Lymphatic: Aortic atherosclerosis. No enlarged abdominal or pelvic lymph nodes. Reproductive: Uterus and bilateral adnexa are unremarkable. Other: No free fluid or free intraperitoneal gas. No abdominal wall hernia. Musculoskeletal: No acute or destructive bony abnormalities. Reconstructed images demonstrate no additional findings. IMPRESSION: 1. No acute intra-abdominal or intrapelvic trauma. 2. Nonobstructing 6 mm left renal calculus. 3.  Aortic Atherosclerosis (ICD10-I70.0). Electronically Signed   By: Bobbye Burrow M.D.   On: 03/02/2024 12:17    Microbiology: Results for orders placed or performed during the hospital encounter of 08/29/23  Resp panel by RT-PCR (RSV, Flu A&B, Covid) Anterior Nasal Swab     Status: Abnormal   Collection Time: 08/29/23  6:32 PM   Specimen: Anterior  Nasal Swab  Result Value Ref Range Status   SARS Coronavirus 2 by RT PCR POSITIVE (A) NEGATIVE Final    Comment: (NOTE) SARS-CoV-2 target nucleic acids are DETECTED.  The SARS-CoV-2 RNA is generally detectable in upper respiratory specimens during the acute phase of infection. Positive results are indicative of the presence of the identified virus, but do not rule out bacterial infection or co-infection with other pathogens not detected by the test. Clinical correlation with patient history and other diagnostic information is necessary to determine patient infection status. The expected result is Negative.  Fact Sheet for Patients: BloggerCourse.com  Fact Sheet for Healthcare Providers: SeriousBroker.it  This  test is not yet approved or cleared by the [place]  FDA and  has been authorized for detection and/or diagnosis of SARS-CoV-2 by FDA under an Emergency Use Authorization (EUA).  This EUA will remain in effect (meaning this test can be used) for the duration of  the COVID-19 declaration under Section 564(b)(1) of the A ct, 21 U.S.C. section 360bbb-3(b)(1), unless the authorization is terminated or revoked sooner.     Influenza A by PCR NEGATIVE NEGATIVE Final   Influenza B by PCR NEGATIVE NEGATIVE Final    Comment: (NOTE) The Xpert Xpress SARS-CoV-2/FLU/RSV plus assay is intended as an aid in the diagnosis of influenza from Nasopharyngeal swab specimens and should not be used as a sole basis for treatment. Nasal washings and aspirates are unacceptable for Xpert Xpress SARS-CoV-2/FLU/RSV testing.  Fact Sheet for Patients: BloggerCourse.com  Fact Sheet for Healthcare Providers: SeriousBroker.it  This test is not yet approved or cleared by the [place]  FDA and has been authorized for detection and/or diagnosis of SARS-CoV-2 by FDA under an Emergency Use Authorization (EUA). This EUA will remain in effect (meaning this test can be used) for the duration of the COVID-19 declaration under Section 564(b)(1) of the Act, 21 U.S.C. section 360bbb-3(b)(1), unless the authorization is terminated or revoked.     Resp Syncytial Virus by PCR NEGATIVE NEGATIVE Final    Comment: (NOTE) Fact Sheet for Patients: BloggerCourse.com  Fact Sheet for Healthcare Providers: SeriousBroker.it  This test is not yet approved or cleared by the [place]  FDA and has been authorized for detection and/or diagnosis of SARS-CoV-2 by FDA under an Emergency Use Authorization (EUA). This EUA will remain in effect (meaning this test can be used) for the duration of the COVID-19 declaration under  Section 564(b)(1) of the Act, 21 U.S.C. section 360bbb-3(b)(1), unless the authorization is terminated or revoked.  Performed at [facility], [address] [place], [place] [address]    *Note: Due to a large number of results and/or encounters for the requested time period, some results have not been displayed. A complete set of results can be found in Results Review.    Labs: CBC: Recent Labs  Lab 03/02/24 1100 03/02/24 1729 03/02/24 2146 03/03/24 0427 03/04/24 0211 03/05/24 0438 03/06/24 0412 03/07/24 0443  WBC 6.7  --   --  6.6  --  8.0  --   --   NEUTROABS 5.3  --   --   --   --   --   --   --   HGB 6.6* 7.4* 7.6* 7.1* 6.9* 8.4* 7.1* 8.3*  HCT 25.4* 26.5* 27.8* 25.9*  --  30.6*  --   --   MCV 76.0*  --   --  76.2*  --  79.5*  --   --   PLT 291  --   --  255  --  286  --   --    Basic Metabolic Panel: Recent Labs  Lab 03/02/24 1100 03/02/24 1300 03/03/24 0427 03/05/24 0438 03/07/24 0443  NA 139  --  140 140 141  K 3.1*  --  3.3* 3.4* 3.1*  CL 101  --  103 106 108  CO2 31  --  29 28 26   GLUCOSE 104*  --  89 85 95  BUN 11  --  12 8 9   CREATININE 0.75  --  0.83 0.68 0.74  CALCIUM  9.0  --  8.5* 9.0 8.8*  MG  --  2.8*  --   --   --    Liver Function Tests: Recent Labs  Lab 03/02/24 1100  AST 30  ALT 34  ALKPHOS 91  BILITOT 0.5  PROT 7.0  ALBUMIN 3.7   CBG: No results for input(s): "GLUCAP" in the last 168 hours.  Discharge time spent: greater than 30 minutes.  Signed: Verla Glaze, MD [facility] 03/07/2024

## 2024-03-07 NOTE — Progress Notes (Signed)
 Discharge orders met; PIVs removed intact, discharge instructions reviewed with pt.

## 2024-03-07 NOTE — Plan of Care (Signed)

## 2024-03-07 NOTE — Plan of Care (Signed)
  Problem: Education: Goal: Knowledge of General Education information will improve Description: Including pain rating scale, medication(s)/side effects and non-pharmacologic comfort measures Outcome: Progressing   Problem: Clinical Measurements: Goal: Ability to maintain clinical measurements within normal limits will improve Outcome: Progressing   Problem: Elimination: Goal: Will not experience complications related to bowel motility Outcome: Progressing   Problem: Pain Managment: Goal: General experience of comfort will improve and/or be controlled Outcome: Progressing   Problem: Safety: Goal: Ability to remain free from injury will improve Outcome: Progressing

## 2024-03-08 ENCOUNTER — Telehealth: Payer: Self-pay | Admitting: Family

## 2024-03-08 ENCOUNTER — Inpatient Hospital Stay

## 2024-03-08 ENCOUNTER — Ambulatory Visit: Payer: Self-pay | Admitting: Gastroenterology

## 2024-03-08 ENCOUNTER — Encounter: Payer: Self-pay | Admitting: Oncology

## 2024-03-08 ENCOUNTER — Inpatient Hospital Stay: Attending: Oncology | Admitting: Oncology

## 2024-03-08 VITALS — BP 144/61 | HR 76 | Resp 18

## 2024-03-08 VITALS — BP 143/78 | HR 76 | Temp 98.2°F | Resp 18 | Wt 214.0 lb

## 2024-03-08 DIAGNOSIS — D509 Iron deficiency anemia, unspecified: Secondary | ICD-10-CM | POA: Diagnosis present

## 2024-03-08 DIAGNOSIS — D500 Iron deficiency anemia secondary to blood loss (chronic): Secondary | ICD-10-CM

## 2024-03-08 MED ORDER — IRON SUCROSE 20 MG/ML IV SOLN
200.0000 mg | Freq: Once | INTRAVENOUS | Status: DC
  Filled 2024-03-08: qty 10

## 2024-03-08 MED ORDER — TORSEMIDE 40 MG PO TABS: ORAL_TABLET | ORAL | Status: DC

## 2024-03-08 MED ORDER — IRON SUCROSE 20 MG/ML IV SOLN
200.0000 mg | Freq: Once | INTRAVENOUS | Status: AC
  Administered 2024-03-08: 200 mg via INTRAVENOUS

## 2024-03-08 MED ORDER — SODIUM CHLORIDE 0.9% FLUSH
10.0000 mL | Freq: Once | INTRAVENOUS | Status: AC | PRN
  Administered 2024-03-08: 10 mL
  Filled 2024-03-08: qty 10

## 2024-03-08 NOTE — Addendum Note (Signed)
 Addended by: Keyra Virella A on: 03/08/2024 04:48 PM   Modules accepted: Orders

## 2024-03-08 NOTE — Progress Notes (Signed)
 Called and spoke with pt regarding her wt gain. Pt states she went from 204 to 214 lbs since getting out of there hospital. Pt stated she had chest heaviness with no pain, swollen ankles, and some facial swelling around her eyes. Pt states she just got home from the hospital yesterday, but wasn't sent home with any medications. Pt has been off of her torsemide  for 6 days. Pt states she does have medications at home and took her first dose of Torsemide  80mg  today like usual. She states she thinks it is working because she has been urinating a lot since taking it. (Pt supposed to take Torsemide  80 MG qAM and 40 MG qPM). Advised pt to go to ER due to sx and facial swelling. Pt stated she had no way of getting there due to her relatives being at work. Offered to call 911 for pt so she could be transported by ambulance. Pt declined. Notified Shawnee Dellen, FNP of pt's symptoms. Brian Campanile advises pt to start back taking Torsemide  and potassium like normal and if sx continue with no relief or worsen, pt advised to go to ER. Pt aware that this is a Holiday weekend and we will not be back in office until Tuesday, May 26th. Pt agreed to continue current tx plan over the weekend.

## 2024-03-09 ENCOUNTER — Encounter: Payer: Self-pay | Admitting: Oncology

## 2024-03-09 NOTE — Assessment & Plan Note (Addendum)
 Lab Results  Component Value Date   HGB 8.3 (L) 03/07/2024   TIBC 501 (H) 03/02/2024   IRONPCTSAT 4 (L) 03/02/2024   FERRITIN 11 03/02/2024    Recommend weekly Venofer  x 3

## 2024-03-09 NOTE — Progress Notes (Signed)
 Hematology/Oncology Consult note Telephone:([phone] Fax:([phone]) [phone]  CHIEF COMPLAINTS/REASON FOR VISIT:  Iron  deficiency anemia  ASSESSMENT & PLAN:   Iron  deficiency anemia due to chronic blood loss Lab Results  Component Value Date   HGB 8.3 (L) 03/07/2024   TIBC 501 (H) 03/02/2024   IRONPCTSAT 4 (L) 03/02/2024   FERRITIN 11 03/02/2024    Recommend weekly Venofer  x 3   Follow-up in 2 months. All questions were answered. The patient knows to call the clinic with any problems, questions or concerns.  Timmy Forbes, MD, PhD [facility] Hematology Oncology 03/08/2024     HISTORY OF PRESENTING ILLNESS:  Theresa [name] is a  68 y.o.  female with PMH listed below who was referred to me for anemia Reviewed patient's recent labs that was done.  She was found to have anemia since September 2023,  acute blood loss anemia Hb 7.4. She was on Xarelto  for A Fib and Aspirin  at that time, anticoagulation was held. She was transfused with PRBCs She underwent EGD, fount to have gastritis, non bleeding duodenal ulcers. Recently she follows up with Dr.Toledo and had repeat blood work on 09/19/22, which still showed persistent anemia with hemoglobin of 7.1, 09/27/2022 Ferritin 19, TIBC 578,  She has chronic respiratory failure due to pulmonary hypertension, on nasal cannula oxygen , follows ups with pulmonology.    INTERVAL HISTORY Theresa [name] is a 68 y.o. female who has above history reviewed by me today presents for follow up visit for iron  deficiency anemia. She was off Xarelto  for about a year and resumed 2-3 months ago. Patient was recently hospitalized due to acute anemia due to GI bleeding.  Xarelto  was held. 03/05/2024, status post colonoscopy with polypectomy.  She had moderate bleeding afterwards and had a flex sigmoidoscopy with cautery and clip placement. Patient was transfused with PRBC and Venofer  treatments.  Hemoglobin improved to 8.3 and was discharged home. Today  patient presented to reestablish care.  Denies any black tarry stools blood in the stool.  Chronic fatigue.   MEDICAL HISTORY:  Past Medical History:  Diagnosis Date   Acute postoperative pain 10/03/2017   Anxiety    BiPAP (biphasic positive airway pressure) dependence    at hs   Carpal tunnel syndrome    CHF (congestive heart failure) (HCC)    1/18   CHF (congestive heart failure) (HCC)    Chronic generalized abdominal pain    Chronic rhinitis    COPD (chronic obstructive pulmonary disease) (HCC)    DDD (degenerative disc disease), cervical    DDD (degenerative disc disease), lumbosacral    Depression    Dyspnea    Edema    Flu    1/18   Gastritis    GERD (gastroesophageal reflux disease)    Hematuria    Hernia of abdominal wall 07/17/2018   Hernia, abdominal    Hypertension    Kidney stones    Low back pain    Lumbar radiculitis    Malodorous urine    Muscle weakness    Obesity    Oxygen  dependent    Pneumonia due to COVID-19 virus 02/03/2020   Renal cyst    Sensory urge incontinence    Thyroid  activity decreased    9/19   Tobacco abuse    Wheezing     SURGICAL HISTORY: Past Surgical History:  Procedure Laterality Date   ABDOMINAL HYSTERECTOMY     CARDIOVERSION N/A 04/29/2022   Procedure: CARDIOVERSION;  Surgeon: Michelle Aid, MD;  Location: [facility] OR[facility];  Service: Cardiovascular;  Laterality: N/A;   CARDIOVERSION N/A 01/30/2024   Procedure: CARDIOVERSION;  Surgeon: Antonette Batters, MD;  Location: [facility];  Service: Cardiovascular;  Laterality: N/A;   CARDIOVERSION N/A 02/19/2024   Procedure: CARDIOVERSION;  Surgeon: Antonette Batters, MD;  Location: [facility];  Service: Cardiovascular;  Laterality: N/A;   CARPAL TUNNEL RELEASE Left 2012   cataract surgery Bilateral    COLONOSCOPY N/A 03/05/2024   Procedure: COLONOSCOPY;  Surgeon: Marnee Sink, MD;  Location: [facility] ENDOS[facility];  Service: Endoscopy;  Laterality: N/A;   EPIGASTRIC HERNIA REPAIR N/A 08/13/2018    HERNIA REPAIR EPIGASTRIC ADULT; polypropylene mesh reinforcement.  Surgeon: Marshall Skeeter, MD;  Location: [facility];  Service: General;  Laterality: N/A;   ESOPHAGOGASTRODUODENOSCOPY N/A 06/21/2022   Procedure: ESOPHAGOGASTRODUODENOSCOPY (EGD);  Surgeon: Toledo, Alphonsus Jeans, MD;  Location: [facility];  Service: Gastroenterology;  Laterality: N/A;   ESOPHAGOGASTRODUODENOSCOPY N/A 03/05/2024   Procedure: EGD (ESOPHAGOGASTRODUODENOSCOPY);  Surgeon: Marnee Sink, MD;  Location: [facility] ENDOSCOPY;  Service: Endoscopy;  Laterality: N/A;   FLEXIBLE SIGMOIDOSCOPY N/A 03/05/2024   Procedure: Marlynn Singer;  Surgeon: Marnee Sink, MD;  Location: [facility];  Service: Endoscopy;  Laterality: N/A;   POLYPECTOMY  03/05/2024   Procedure: POLYPECTOMY, INTESTINE;  Surgeon: Marnee Sink, MD;  Location: [facility] ENDOS[facility];  Service: Endoscopy;;   RIGHT/LEFT HEART CATH AND CORONARY ANGIOGRAPHY N/A 07/11/2019   Procedure: RIGHT/LEFT HEART CATH AND CORONARY ANGIOGRAPHY;  Surgeon: Antonette Batters, MD;  Location: [facility];  Service: Cardiovascular;  Laterality: N/A;   TEE WITHOUT CARDIOVERSION N/A 04/29/2022   Procedure: TRANSESOPHAGEAL ECHOCARDIOGRAM (TEE);  Surgeon: Michelle Aid, MD;  Location: [facility];  Service: Cardiovascular;  Laterality: N/A;   TONSILLECTOMY     TUBAL LIGATION     UMBILICAL HERNIA REPAIR N/A 08/13/2018   HERNIA REPAIR UMBILICAL ADULT; polypropylene mesh reinforcement Surgeon: Marshall Skeeter, MD;  Location: [facility];  Service: General;  Laterality: N/A;    SOCIAL HISTORY: Social History   Socioeconomic History   Marital status: Married    Spouse name: Elana Grayer   Number of children: 2   Years of education: Not on file   Highest education level: 12th grade  Occupational History   Occupation: disability  Tobacco Use   Smoking status: Former    Current packs/day: 1.50    Average packs/day: 1.5 packs/day for 44.0 years (66.0 ttl pk-yrs)    Types:  Cigarettes   Smokeless tobacco: Never   Tobacco comments:    Stopped 4 weeks ago May 20, 2023  Vaping Use   Vaping status: Never Used  Substance and Sexual Activity   Alcohol use: No   Drug use: Yes    Comment: prescribed hydrocodone    Sexual activity: Not Currently    Birth control/protection: Surgical  Other Topics Concern   Not on file  Social History Narrative   ** Merged History Encounter **       Lives with spouse    Social Drivers of Health   Financial Resource Strain: Medium Risk (02/26/2024)   Received from [facility]   Overall Financial Resource Strain (CARDIA)    Difficulty of Paying Living Expenses: Somewhat hard  Food Insecurity: No Food Insecurity (03/02/2024)   Hunger Vital Sign    Worried About Running Out of Food in the Last Year: Never true    Ran Out of Food in the Last Year: Never true  Recent Concern: Food Insecurity - Food Insecurity Present (02/26/2024)  Received from [facility]   Hunger Vital Sign    Worried About Running Out of Food in the Last Year: Sometimes true    Ran Out of Food in the Last Year: Often true  Transportation Needs: No Transportation Needs (03/02/2024)   PRAPARE - Administrator, Civil Service (Medical): No    Lack of Transportation (Non-Medical): No  Physical Activity: Inactive (08/24/2022)   Received from [facility], [facility]   Exercise Vital Sign    Days of Exercise per Week: 0 days    Minutes of Exercise per Session: 0 min  Stress: Stress Concern Present (08/24/2022)   Received from [facility], [facility]   [employer] of Occupational Health - Occupational Stress Questionnaire    Feeling of Stress : Very much  Social Connections: Moderately Integrated (03/02/2024)   Social Connection and Isolation Panel [NHANES]    Frequency of Communication with Friends and Family: More than three times a week     Frequency of Social Gatherings with Friends and Family: Once a week    Attends Religious Services: More than 4 times per year    Active Member of [employer] or Organizations: No    Attends Banker Meetings: Never    Marital Status: Married  Catering manager Violence: Not At Risk (03/02/2024)   Humiliation, Afraid, Rape, and Kick questionnaire    Fear of Current or Ex-Partner: No    Emotionally Abused: No    Physically Abused: No    Sexually Abused: No    FAMILY HISTORY: Family History  Problem Relation Age of Onset   Heart disease Mother    Stroke Mother    Coronary artery disease Mother    Alcohol abuse Father    Heart disease Father    Lung cancer Sister    Cancer Sister    Breast cancer Sister    Cancer Brother    Cancer Brother    Kidney cancer Brother    Pneumonia Brother    Prostate cancer Neg Hx    Bladder Cancer Neg Hx     ALLERGIES:  is allergic to gabapentin , codeine, and meloxicam .  MEDICATIONS:  Current Outpatient Medications  Medication Sig Dispense Refill   acetaminophen  (TYLENOL ) 500 MG tablet Take 1,000 mg by mouth every 8 (eight) hours as needed for mild pain or headache.     albuterol  (VENTOLIN  HFA) 108 (90 Base) MCG/ACT inhaler Inhale 2 puffs into the lungs every 6 (six) hours as needed for wheezing or shortness of breath. 8 g 1   amiodarone  (PACERONE ) 200 MG tablet Take 1 tablet (200 mg total) by mouth daily. 30 tablet 0   busPIRone  (BUSPAR ) 5 MG tablet Take 5 mg by mouth 2 (two) times daily.     Cholecalciferol  50 MCG (2000 UT) CAPS Take 2,000 Units by mouth daily.     dapagliflozin  propanediol (FARXIGA ) 10 MG TABS tablet Take 1 tablet (10 mg total) by mouth daily. 90 tablet 3   diltiazem  (CARDIZEM  CD) 180 MG 24 hr capsule Take 180 mg by mouth daily.     Fluticasone -Umeclidin-Vilant (TRELEGY ELLIPTA) 100-62.5-25 MCG/ACT AEPB Inhale 1 puff into the lungs daily.     FUROSCIX  80 MG/10ML CTKT Inject 80 mg into the skin daily as needed (If gain  over 3x's weight in fluid).     HYDROcodone -acetaminophen  (NORCO/VICODIN) 5-325 MG tablet Take 1 tablet by mouth every 8 (eight) hours as needed for severe  pain (pain score 7-10). Must last 30 days 90 tablet 0   ipratropium-albuterol  (DUONEB) 0.5-2.5 (3) MG/3ML SOLN USE 1 VIAL VIA NEBULIZER EVERY 6 HOURS AS NEEDED FOR SHORTNESS OF BREATH OR WHEEZING 1080 mL 0   iron  polysaccharides (NIFEREX) 150 MG capsule Take 1 capsule (150 mg total) by mouth daily. 90 capsule 3   naloxone  (NARCAN ) nasal spray 4 mg/0.1 mL Place 1 spray into the nose as needed for up to 365 doses (for opioid-induced respiratory depresssion). In case of emergency (overdose), spray once into each nostril. If no response within 3 minutes, repeat application and call 911. 1 each 0   nystatin  (MYCOSTATIN ) 100000 UNIT/ML suspension Take 5 mLs by mouth 4 (four) times daily.     omeprazole  (PRILOSEC) 40 MG capsule Take 40 mg by mouth daily.     roflumilast  (DALIRESP ) 500 MCG TABS tablet Take 500 mcg by mouth daily.     sertraline  (ZOLOFT ) 100 MG tablet Take 2 tablets (200 mg total) by mouth daily. Please schedule office visit before any future refill. 60 tablet 0   spironolactone  (ALDACTONE ) 25 MG tablet Take 0.5 tablets (12.5 mg total) by mouth daily. 45 tablet 3   tirzepatide (MOUNJARO) 12.5 MG/0.5ML Pen Inject 12.5 mg into the skin once a week. 2 mL 0   OXYGEN  Inhale 5 L into the lungs continuous.     potassium chloride  SA (KLOR-CON  M) 20 MEQ tablet Take 2 tablets every morning and 1 tablet every evening 270 tablet 3   Torsemide  40 MG TABS TAKE 80 MG QAM AND 40 MG QPM     No current facility-administered medications for this visit.    Review of Systems  Constitutional:  Positive for fatigue. Negative for chills and fever.  HENT:   Negative for hearing loss and voice change.   Eyes:  Negative for eye problems.  Respiratory:  Positive for shortness of breath. Negative for chest tightness and cough.   Cardiovascular:  Negative for  chest pain.  Gastrointestinal:  Negative for abdominal distention, abdominal pain and blood in stool.  Endocrine: Negative for hot flashes.  Genitourinary:  Negative for difficulty urinating and frequency.   Musculoskeletal:  Negative for arthralgias.  Skin:  Negative for itching and rash.  Neurological:  Negative for extremity weakness.  Hematological:  Negative for adenopathy. Does not bruise/bleed easily.  Psychiatric/Behavioral:  Negative for confusion.     PHYSICAL EXAMINATION: Vitals:   03/08/24 1237  BP: (!) 143/78  Pulse: 76  Resp: 18  Temp: 98.2 F (36.8 C)  SpO2: 96%   Filed Weights   03/08/24 1237  Weight: 214 lb (97.1 kg)    Physical Exam Constitutional:      General: She is not in acute distress.    Appearance: She is obese.     Comments: Patient sits in the wheelchair  HENT:     Head: Normocephalic and atraumatic.  Eyes:     General: No scleral icterus. Cardiovascular:     Rate and Rhythm: Normal rate.  Pulmonary:     Effort: Pulmonary effort is normal. No respiratory distress.     Breath sounds: No wheezing.     Comments: Patient breathes comfortably via nasal cannula oxygen .  Decreased breath sound bilaterally Abdominal:     General: Bowel sounds are normal. There is no distension.     Palpations: Abdomen is soft.  Musculoskeletal:        General: No deformity. Normal range of motion.     Cervical back: Normal  range of motion and neck supple.  Skin:    General: Skin is warm and dry.     Findings: No erythema or rash.  Neurological:     Mental Status: She is alert and oriented to person, place, and time. Mental status is at baseline.     Cranial Nerves: No cranial nerve deficit.  Psychiatric:        Mood and Affect: Mood normal.      LABORATORY DATA:  I have reviewed the data as listed    Latest Ref Rng & Units 03/07/2024    4:43 AM 03/06/2024    4:12 AM 03/05/2024    4:38 AM  CBC  WBC 4.0 - 10.5 K/uL   8.0   Hemoglobin 12.0 - 15.0 g/dL  8.3  7.1  8.4   Hematocrit 36.0 - 46.0 %   30.6   Platelets 150 - 400 K/uL   286       Latest Ref Rng & Units 03/07/2024    4:43 AM 03/05/2024    4:38 AM 03/03/2024    4:27 AM  CMP  Glucose 70 - 99 mg/dL 95  85  89   BUN 8 - 23 mg/dL 9  8  12    Creatinine 0.44 - 1.00 mg/dL 6.21  3.08  6.57   Sodium 135 - 145 mmol/L 141  140  140   Potassium 3.5 - 5.1 mmol/L 3.1  3.4  3.3   Chloride 98 - 111 mmol/L 108  106  103   CO2 22 - 32 mmol/L 26  28  29    Calcium  8.9 - 10.3 mg/dL 8.8  9.0  8.5       Component Value Date/Time   IRON  18 (L) 03/02/2024 1100   TIBC 501 (H) 03/02/2024 1100   FERRITIN 11 03/02/2024 1100   IRONPCTSAT 4 (L) 03/02/2024 1100     RADIOGRAPHIC STUDIES: I have personally reviewed the radiological images as listed and agreed with the findings in the report. DG Chest 1 View Result Date: 03/02/2024 CLINICAL DATA:  Congestive heart failure.  Dizziness. EXAM: CHEST  1 VIEW COMPARISON:  Radiograph 08/29/2023. Lung bases from abdominopelvic CT earlier today FINDINGS: The heart is mildly enlarged, stable. Unchanged peribronchial thickening. Subsegmental atelectasis in the lung bases. No significant pleural effusion. No pneumothorax. IMPRESSION: 1. Unchanged peribronchial thickening, bronchitic versus congestive. Mild cardiomegaly is unchanged. 2. Subsegmental atelectasis in the lung bases. Electronically Signed   By: Chadwick Colonel M.D.   On: 03/02/2024 13:06   CT ABDOMEN PELVIS W CONTRAST Result Date: 03/02/2024 CLINICAL DATA:  Blunt abdominal trauma EXAM: CT ABDOMEN AND PELVIS WITH CONTRAST TECHNIQUE: Multidetector CT imaging of the abdomen and pelvis was performed using the standard protocol following bolus administration of intravenous contrast. RADIATION DOSE REDUCTION: This exam was performed according to the departmental dose-optimization program which includes automated exposure control, adjustment of the mA and/or kV according to patient size and/or use of iterative  reconstruction technique. CONTRAST:  OMNIPAQUE  IOHEXOL  300 MG/ML  SOLN COMPARISON:  06/19/2022 FINDINGS: Lower chest: No acute pleural or parenchymal lung disease. Hepatobiliary: Scattered hepatic cysts are again noted. No other focal liver abnormalities. Gallbladder is unremarkable. No biliary duct dilation. Pancreas: Unremarkable. No pancreatic ductal dilatation or surrounding inflammatory changes. Spleen: Normal in size without focal abnormality. Adrenals/Urinary Tract: Stable appearance of the adrenals. Nonobstructing 6 mm calculus lower pole left kidney. Right kidney is unremarkable. Bladder is moderately distended without focal abnormality. Stomach/Bowel: No bowel obstruction or ileus. Normal appendix  right lower quadrant. No bowel wall thickening or inflammatory change. Vascular/Lymphatic: Aortic atherosclerosis. No enlarged abdominal or pelvic lymph nodes. Reproductive: Uterus and bilateral adnexa are unremarkable. Other: No free fluid or free intraperitoneal gas. No abdominal wall hernia. Musculoskeletal: No acute or destructive bony abnormalities. Reconstructed images demonstrate no additional findings. IMPRESSION: 1. No acute intra-abdominal or intrapelvic trauma. 2. Nonobstructing 6 mm left renal calculus. 3.  Aortic Atherosclerosis (ICD10-I70.0). Electronically Signed   By: Bobbye Burrow M.D.   On: 03/02/2024 12:17

## 2024-03-10 LAB — CELIAC DISEASE PANEL
Endomysial Ab, IgA: NEGATIVE
IgA: 81 mg/dL — ABNORMAL LOW (ref 87–352)
Tissue Transglutaminase Ab, IgA: <2 U/mL (ref 0–3)

## 2024-03-10 LAB — TISSUE TRANSGLUTAMINASE, IGG: Tissue Transglut Ab: <2 U/mL (ref 0–5)

## 2024-03-15 ENCOUNTER — Other Ambulatory Visit: Payer: Self-pay | Admitting: Oncology

## 2024-03-15 ENCOUNTER — Inpatient Hospital Stay

## 2024-03-15 VITALS — BP 122/67 | HR 73 | Temp 98.3°F | Resp 18

## 2024-03-15 DIAGNOSIS — D500 Iron deficiency anemia secondary to blood loss (chronic): Secondary | ICD-10-CM

## 2024-03-15 DIAGNOSIS — D509 Iron deficiency anemia, unspecified: Secondary | ICD-10-CM | POA: Diagnosis not present

## 2024-03-15 MED ORDER — SODIUM CHLORIDE 0.9% FLUSH
10.0000 mL | Freq: Once | INTRAVENOUS | Status: AC | PRN
  Administered 2024-03-15: 10 mL
  Filled 2024-03-15: qty 10

## 2024-03-15 MED ORDER — IRON SUCROSE 20 MG/ML IV SOLN
200.0000 mg | Freq: Once | INTRAVENOUS | Status: AC
  Administered 2024-03-15: 200 mg via INTRAVENOUS

## 2024-03-22 ENCOUNTER — Inpatient Hospital Stay: Attending: Oncology

## 2024-03-22 VITALS — BP 126/69 | HR 76 | Temp 96.6°F | Resp 18

## 2024-03-22 DIAGNOSIS — D500 Iron deficiency anemia secondary to blood loss (chronic): Secondary | ICD-10-CM | POA: Insufficient documentation

## 2024-03-22 MED ORDER — SODIUM CHLORIDE 0.9% FLUSH
10.0000 mL | Freq: Once | INTRAVENOUS | Status: AC | PRN
  Administered 2024-03-22: 10 mL
  Filled 2024-03-22: qty 10

## 2024-03-22 MED ORDER — IRON SUCROSE 20 MG/ML IV SOLN
200.0000 mg | Freq: Once | INTRAVENOUS | Status: AC
  Administered 2024-03-22: 200 mg via INTRAVENOUS

## 2024-03-26 ENCOUNTER — Other Ambulatory Visit: Payer: Self-pay | Admitting: Family

## 2024-03-27 ENCOUNTER — Other Ambulatory Visit: Payer: Self-pay

## 2024-03-27 MED ORDER — POTASSIUM CHLORIDE CRYS ER 20 MEQ PO TBCR: EXTENDED_RELEASE_TABLET | ORAL | 3 refills | Status: AC

## 2024-04-07 NOTE — Progress Notes (Unsigned)
 PROVIDER NOTE: Interpretation of information contained herein should be left to medically-trained personnel. Specific patient instructions are provided elsewhere under Patient Instructions section of medical record. This document was created in part using AI and STT-dictation technology, any transcriptional errors that may result from this process are unintentional.  Patient: Theresa [name]  Service: E/M   PCP: Odell Chard, Edra GRADE, MD  DOB: [birth year]  DOS: 04/10/2024  Provider: Eric DELENA Como, MD  MRN: [id number]  Delivery: Face-to-face  Specialty: Interventional Pain Management  Type: Established Patient  Setting: Ambulatory outpatient facility  Specialty designation: 09  Referring Prov.: Odell Chard, Consuelo *  Location: Outpatient office facility       History of present illness (HPI) [name], a 68 y.o. year old female, is here today because of her No primary diagnosis found.. Theresa [name] primary complain today is No chief complaint on file.  Pertinent problems: Theresa [name] has Neuritis or radiculitis due to rupture of lumbar intervertebral disc; DDD (degenerative disc disease), lumbar; Carpal tunnel syndrome; Neurogenic pain; Neuropathic pain; Musculoskeletal pain; Lumbar spondylosis (L4-5); Chronic low back pain (1ry area of Pain) (Bilateral) (R>L) w/o sciatica; Chronic lower extremity pain (2ry area of Pain) (Left); Chronic lumbar radicular pain (L4 & S1 Dermatome) (Left); Chronic shoulder pain (Right side); Chronic upper extremity pain (Right-sided); Cervical radiculitis (Right side); Abnormal x-ray of lumbar spine; Chronic pain syndrome (significant psychosocial component); 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; 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); Lumbar facet syndrome (Bilateral) (R>L); Chronic sacroiliac joint pain (Bilateral); Lumbar facet hypertrophy (L3-4, L5-S1) (Bilateral); Lumbar spinal stenosis (L4-5); Lumbar foraminal stenosis  (L4-5) (Left); 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); Lumbosacral radiculopathy at L4; Chronic shoulder pain (Left); Arthralgia of acromioclavicular joint (Right); Acromioclavicular joint DJD (Right); Osteoarthritis of shoulder (Right); Chronic hip pain (3ry area of Pain) (Bilateral) (L>R); Chronic lower extremity pain (Bilateral) (R>L); Cervicalgia (Bilateral) (R>L); Spondylosis without myelopathy or radiculopathy, lumbosacral region; Chronic knee pain (Left); Chronic left-sided lumbar radiculopathy; Abnormal MRI, lumbar spine (12/09/2021); Lumbar disc extrusion (L2-3) (Left); Chronic shoulder pain (Bilateral); Abnormal MRI, cervical spine; Trigger point with back pain; Lumbar trigger point syndrome; Lumbar facet joint pain; Grade 1 Anterolisthesis of lumbar spine (4mm) (L3/L4); Grade 1 Retrolisthesis (2mm) (L5/S1); Chronic hip pain (Right); Chronic groin pain (Right); Buttock pain (Right); and Greater trochanteric bursitis (Right) on their pertinent problem list.  Pain Assessment: Severity of   is reported as a  /10. Location:    / . Onset:  . Quality:  . Timing:  . Modifying factor(s):  SABRA Vitals:  vitals were not taken for this visit.  BMI: Estimated body mass index is 39.14 kg/m as calculated from the following:   Height as of 03/02/24: 5' 2 (1.575 m).   Weight as of 03/08/24: 214 lb (97.1 kg).  Last encounter: 01/24/2024. Last procedure: 06/06/2023.  Reason for encounter: evaluation of worsening, or previously known (established) problem.   Discussed the use of AI scribe software for clinical note transcription with the patient, who gave verbal consent to proceed.  History of Present Illness           Pharmacotherapy Assessment   Analgesic: Hydrocodone /APAP 5/325 one tablet every 8 hours (15 mg/day of hydrocodone ) MME/day: 15 mg/day.   Monitoring: [place] PMP: PDMP reviewed during this encounter.       Pharmacotherapy: No side-effects or adverse reactions  reported. Compliance: No problems identified. Effectiveness: Clinically acceptable.  No notes on file  UDS:  Summary  Date  Value Ref Range Status  10/25/2023 FINAL  Final    Comment:    ==================================================================== [employer] [facility] (MW) ==================================================================== Test                             Result       Flag       Units  Drug Present and Declared for Prescription Verification   Hydrocodone                     1227         EXPECTED   ng/mg creat   Hydromorphone                   103          EXPECTED   ng/mg creat   Dihydrocodeine                 146          EXPECTED   ng/mg creat   Norhydrocodone                 518          EXPECTED   ng/mg creat    Sources of hydrocodone  include scheduled prescription medications.    Hydromorphone , dihydrocodeine and norhydrocodone are expected    metabolites of hydrocodone . Hydromorphone  and dihydrocodeine are    also available as scheduled prescription medications.  ==================================================================== Test                      Result    Flag   Units      Ref Range   Creatinine              67               mg/dL      >=79 ==================================================================== Declared Medications:  The flagging and interpretation on this report are based on the  following declared medications.  Unexpected results may arise from  inaccuracies in the declared medications.   **Note: The testing scope of this panel includes these medications:   Hydrocodone  (Norco)   **Note: The testing scope of this panel does not include the  following reported medications:   Acetaminophen  (Tylenol )  Acetaminophen  (Norco)  Albuterol  (Ventolin  HFA)  Albuterol  (Duoneb)  Amiodarone  (Pacerone )  Buspirone  (Buspar )  Cholecalciferol   Dapagliflozin  (Farxiga )  Diltiazem  (Cardizem )  Fluticasone   Furosemide   Ipratropium  (Duoneb)  Melatonin  Naloxone  (Narcan )  Omeprazole  (Prilosec)  Polyethylene Glycol (MiraLAX )  Potassium (Klor-Con )  Roflumilast  (Daliresp )  Semaglutide  (Wegovy )  Sertraline  (Zoloft )  Spironolactone  (Aldactone )  Topical Diclofenac  (Voltaren )  Torsemide  (Demadex )  Umeclidinium  Vilanterol ==================================================================== For clinical consultation, please call [phone]. ====================================================================     No results found for: CBDTHCR No results found for: D8THCCBX No results found for: D9THCCBX  ROS  Constitutional: Denies any fever or chills Gastrointestinal: No reported hemesis, hematochezia, vomiting, or acute GI distress Musculoskeletal: Denies any acute onset joint swelling, redness, loss of ROM, or weakness Neurological: No reported episodes of acute onset apraxia, aphasia, dysarthria, agnosia, amnesia, paralysis, loss of coordination, or loss of consciousness  Medication Review  Cholecalciferol , Fluticasone -Umeclidin-Vilant, Furosemide , HYDROcodone -acetaminophen , Oxygen -Helium, Torsemide , acetaminophen , albuterol , amiodarone , busPIRone , dapagliflozin  propanediol, diltiazem , ipratropium-albuterol , iron  polysaccharides, naloxone , nystatin , omeprazole , potassium chloride  SA, roflumilast , sertraline , spironolactone , and tirzepatide   History Review  Allergy: Theresa [name] is allergic to gabapentin , codeine, and meloxicam . Drug: Theresa [name]  reports current drug use. Alcohol:  reports no history of alcohol use. Tobacco:  reports that she has quit smoking. Her smoking use included cigarettes. She has a 66 pack-year smoking history. She has never used smokeless tobacco. Social: Theresa [name]  reports that she has quit smoking. Her smoking use included cigarettes. She has a 66 pack-year smoking history. She has never used smokeless tobacco. She reports current drug use. She reports that she does not  drink alcohol. Medical:  has a past medical history of Acute postoperative pain (10/03/2017), Anxiety, BiPAP (biphasic positive airway pressure) dependence, Carpal tunnel syndrome, CHF (congestive heart failure) (HCC), CHF (congestive heart failure) (HCC), Chronic generalized abdominal pain, Chronic rhinitis, COPD (chronic obstructive pulmonary disease) (HCC), DDD (degenerative disc disease), cervical, DDD (degenerative disc disease), lumbosacral, Depression, Dyspnea, Edema, Flu, Gastritis, GERD (gastroesophageal reflux disease), Hematuria, Hernia of abdominal wall (07/17/2018), Hernia, abdominal, Hypertension, Kidney stones, Low back pain, Lumbar radiculitis, Malodorous urine, Muscle weakness, Obesity, Oxygen  dependent, Pneumonia due to COVID-19 virus (02/03/2020), Renal cyst, Sensory urge incontinence, Thyroid  activity decreased, Tobacco abuse, and Wheezing. Surgical: Theresa [name]  has a past surgical history that includes Abdominal hysterectomy; Tonsillectomy; Carpal tunnel release (Left, 2012); Tubal ligation; epigastric hernia repair (N/A, 08/13/2018); Umbilical hernia repair (N/A, 08/13/2018); RIGHT/LEFT HEART CATH AND CORONARY ANGIOGRAPHY (N/A, 07/11/2019); TEE without cardioversion (N/A, 04/29/2022); Cardioversion (N/A, 04/29/2022); Esophagogastroduodenoscopy (N/A, 06/21/2022); cataract surgery (Bilateral); Cardioversion (N/A, 01/30/2024); Cardioversion (N/A, 02/19/2024); Flexible sigmoidoscopy (N/A, 03/05/2024); Esophagogastroduodenoscopy (N/A, 03/05/2024); Colonoscopy (N/A, 03/05/2024); and Polypectomy (03/05/2024). Family: family history includes Alcohol abuse in her father; Breast cancer in her sister; Cancer in her brother, brother, and sister; Coronary artery disease in her mother; Heart disease in her father and mother; Kidney cancer in her brother; Lung cancer in her sister; Pneumonia in her brother; Stroke in her mother.  Laboratory Chemistry Profile   Renal Lab Results  Component Value Date   BUN  9 03/07/2024   CREATININE 0.74 03/07/2024   BCR 20 06/05/2023   GFRAA >60 06/26/2020   GFRNONAA >60 03/07/2024    Hepatic Lab Results  Component Value Date   AST 30 03/02/2024   ALT 34 03/02/2024   ALBUMIN 3.7 03/02/2024   ALKPHOS 91 03/02/2024   LIPASE 57 (H) 06/19/2022   AMMONIA 40 (H) 10/11/2019    Electrolytes Lab Results  Component Value Date   NA 141 03/07/2024   K 3.1 (L) 03/07/2024   CL 108 03/07/2024   CALCIUM  8.8 (L) 03/07/2024   MG 2.8 (H) 03/02/2024   PHOS 3.2 06/21/2022    Bone Lab Results  Component Value Date   25OHVITD1 13 (L) 09/17/2015   25OHVITD2 <1.0 09/17/2015   25OHVITD3 13 09/17/2015    Inflammation (CRP: Acute Phase) (ESR: Chronic Phase) Lab Results  Component Value Date   CRP 0.9 04/02/2022   ESRSEDRATE 17 04/06/2022   LATICACIDVEN 0.9 04/01/2022         Note: Above Lab results reviewed.  Recent Imaging Review  DG Chest 1 View CLINICAL DATA:  Congestive heart failure.  Dizziness.  EXAM: CHEST  1 VIEW  COMPARISON:  Radiograph 08/29/2023. Lung bases from abdominopelvic CT earlier today  FINDINGS: The heart is mildly enlarged, stable. Unchanged peribronchial thickening. Subsegmental atelectasis in the lung bases. No significant pleural effusion. No pneumothorax.  IMPRESSION: 1. Unchanged peribronchial thickening, bronchitic versus congestive. Mild cardiomegaly is unchanged. 2. Subsegmental atelectasis in the lung bases.  Electronically Signed   By: Andrea Gasman M.D.   On: 03/02/2024 13:06 CT ABDOMEN PELVIS W CONTRAST CLINICAL DATA:  Blunt abdominal trauma  EXAM: CT ABDOMEN AND PELVIS WITH CONTRAST  TECHNIQUE: Multidetector CT imaging of the abdomen and pelvis was performed using the standard protocol following bolus administration of intravenous contrast.  RADIATION DOSE REDUCTION: This exam was performed according to the departmental dose-optimization program which includes automated exposure control, adjustment  of the mA and/or kV according to patient size and/or use of iterative reconstruction technique.  CONTRAST:  OMNIPAQUE  IOHEXOL  300 MG/ML  SOLN  COMPARISON:  06/19/2022  FINDINGS: Lower chest: No acute pleural or parenchymal lung disease.  Hepatobiliary: Scattered hepatic cysts are again noted. No other focal liver abnormalities. Gallbladder is unremarkable. No biliary duct dilation.  Pancreas: Unremarkable. No pancreatic ductal dilatation or surrounding inflammatory changes.  Spleen: Normal in size without focal abnormality.  Adrenals/Urinary Tract: Stable appearance of the adrenals. Nonobstructing 6 mm calculus lower pole left kidney. Right kidney is unremarkable. Bladder is moderately distended without focal abnormality.  Stomach/Bowel: No bowel obstruction or ileus. Normal appendix right lower quadrant. No bowel wall thickening or inflammatory change.  Vascular/Lymphatic: Aortic atherosclerosis. No enlarged abdominal or pelvic lymph nodes.  Reproductive: Uterus and bilateral adnexa are unremarkable.  Other: No free fluid or free intraperitoneal gas. No abdominal wall hernia.  Musculoskeletal: No acute or destructive bony abnormalities. Reconstructed images demonstrate no additional findings.  IMPRESSION: 1. No acute intra-abdominal or intrapelvic trauma. 2. Nonobstructing 6 mm left renal calculus. 3.  Aortic Atherosclerosis (ICD10-I70.0).  Electronically Signed   By: Ozell Daring M.D.   On: 03/02/2024 12:17 Note: Reviewed        Physical Exam  General appearance: Well nourished, well developed, and well hydrated. In no apparent acute distress Mental status: Alert, oriented x 3 (person, place, & time)       Respiratory: No evidence of acute respiratory distress Eyes: PERLA Vitals: There were no vitals taken for this visit. BMI: Estimated body mass index is 39.14 kg/m as calculated from the following:   Height as of 03/02/24: 5' 2 (1.575 m).   Weight  as of 03/08/24: 214 lb (97.1 kg). Ideal: Patient weight not recorded  Assessment   Diagnosis Status  No diagnosis found. Controlled Controlled Controlled   Updated Problems: No problems updated.  Plan of Care  Problem-specific:  Assessment and Plan            Theresa [name] has a current medication list which includes the following long-term medication(s): albuterol , amiodarone , hydrocodone -acetaminophen , ipratropium-albuterol , iron  polysaccharides, potassium chloride  sa, sertraline , spironolactone , and torsemide .  Pharmacotherapy (Medications Ordered): No orders of the defined types were placed in this encounter.  Orders:  No orders of the defined types were placed in this encounter.    Interventional Therapies  Risk Factors  Considerations  Medical Comorbidities:  COPD  Emphysema  Chronic Smoker  O2 dependent  SOB  CHF  Hx. of A-Fib (Cardioverted)  Obesity  CAD   NOTE: NO further Lumbar RFA until BMI <35. Patient taken off of Xarelto  after cardioversion.   Planned  Pending:   Diagnostic/therapeutic right IA hip and trochanteric bursa inj. #1    Under consideration:   Diagnostic bilateral cervical facet MBB #1  Palliative left L2-3 LESI #4  NOTE: NO further Lumbar RFA until BMI <35.   Completed:   Palliative right lumbar facet MBB x7 (09/20/2022) (100/100/100x5days/0)  Palliative left lumbar facet MBB x6 (11/11/2021) (100/100/0/0)  Therapeutic left L2-3 LESI x4 (05/09/2023) (8-0/10) (100/100/50/>50)  Diagnostic/palliative left L4 TFESI x1 (12/26/2017) (100/100/50/>50)  Diagnostic/palliative left L4-5 LESI x2 (07/30/2019) (100/100/0/0)  Diagnostic/palliative  right L4 TFESI x1 (01/25/2018) (100/100/50/50)  Diagnostic/palliative right L4-5 LESI x1 (01/25/2018) (100/100/50/50)  Palliative left lumbar facet RFA x1 (06/27/2017) (0/0/100/>50)  Palliative right lumbar facet RFA x1 (10/03/2017) (100/100/75)    Therapeutic  Palliative (PRN) options:    Evaluation needed before each determination.   Completed by other providers:      Pharmacotherapy  Nonopioids transferred 08/03/2020: Gabapentin  and baclofen  Note: Patient taken off of Xarelto  after cardioversion.     No follow-ups on file.    Recent Visits Date Type Provider Dept  01/24/24 Office Visit Tanya Glisson, MD [facility]  Showing recent visits within past 90 days and meeting all other requirements Future Appointments Date Type Provider Dept  04/10/24 Appointment Tanya Glisson, MD [facility]  04/17/24 Appointment Patel, Seema K, NP [facility]  Showing future appointments within next 90 days and meeting all other requirements  I discussed the assessment and treatment plan with the patient. The patient was provided an opportunity to ask questions and all were answered. The patient agreed with the plan and demonstrated an understanding of the instructions.  Patient advised to call back or seek an in-person evaluation if the symptoms or condition worsens.  Duration of encounter: *** minutes.  Total time on encounter, as per AMA guidelines included both the face-to-face and non-face-to-face time personally spent by the physician and/or other qualified health care professional(s) on the day of the encounter (includes time in activities that require the physician or other qualified health care professional and does not include time in activities normally performed by clinical staff). Physician's time may include the following activities when performed: Preparing to see the patient (e.g., pre-charting review of records, searching for previously ordered imaging, lab work, and nerve conduction tests) Review of prior analgesic pharmacotherapies. Reviewing PMP Interpreting ordered tests (e.g., lab work, imaging, nerve conduction tests) Performing post-procedure evaluations, including interpretation of diagnostic procedures Obtaining and/or  reviewing separately obtained history Performing a medically appropriate examination and/or evaluation Counseling and educating the patient/family/caregiver Ordering medications, tests, or procedures Referring and communicating with other health care professionals (when not separately reported) Documenting clinical information in the electronic or other health record Independently interpreting results (not separately reported) and communicating results to the patient/ family/caregiver Care coordination (not separately reported)  Note by: Glisson DELENA Tanya, MD (TTS and AI technology used. I apologize for any typographical errors that were not detected and corrected.) Date: 04/10/2024; Time: 4:51 PM

## 2024-04-09 NOTE — Patient Instructions (Signed)
 ______________________________________________________________________    Procedure instructions  Stop blood-thinners  Do not eat or drink fluids (other than water ) for 6 hours before your procedure  No water  for 2 hours before your procedure  Take your blood pressure medicine with a sip of water   Arrive 30 minutes before your appointment  If sedation is planned, bring suitable driver. Teddie Favre, [place], & public transportation are NOT APPROVED)  Carefully read the "Preparing for your procedure" detailed instructions  If you have questions call us  at [phone]  Procedure appointments are for procedures only.   NO medication refills or new problem evaluations will be done on procedure days.   Only the scheduled, pre-approved procedure and side will be done.   ______________________________________________________________________      ______________________________________________________________________    Preparing for your procedure  Appointments: If you think you may not be able to keep your appointment, call 24-48 hours in advance to cancel. We need time to make it available to others.  Procedure visits are for procedures only. During your procedure appointment there will be: NO Prescription Refills*. NO medication changes or discussions*. NO discussion of disability issues*. NO unrelated pain problem evaluations*. NO evaluations to order other pain procedures*. *These will be addressed at a separate and distinct evaluation encounter on the provider's evaluation schedule and not during procedure days.  Instructions: Food intake: Avoid eating anything solid for at least 8 hours prior to your procedure. Clear liquid intake: You may take clear liquids such as water  up to 2 hours prior to your procedure. (No carbonated drinks. No soda.) Transportation: Unless otherwise stated by your physician, bring a driver. (Driver cannot be a Market researcher, Pharmacist, community, or any other form of public  transportation.) Morning Medicines: Except for blood thinners, take all of your other morning medications with a sip of water . Make sure to take your heart and blood pressure medicines. If your blood pressure's lower number is above 100, the case will be rescheduled. Blood thinners: Make sure to stop your blood thinners as instructed.  If you take a blood thinner, but were not instructed to stop it, call our office [phone] and ask to talk to a nurse. Not stopping a blood thinner prior to certain procedures could lead to serious complications. Diabetics on insulin : Notify the staff so that you can be scheduled 1st case in the morning. If your diabetes requires high dose insulin , take only  of your normal insulin  dose the morning of the procedure and notify the staff that you have done so. Preventing infections: Shower with an antibacterial soap the morning of your procedure.  Build-up your immune system: Take 1000 mg of Vitamin C with every meal (3 times a day) the day prior to your procedure. Antibiotics: Inform the nursing staff if you are taking any antibiotics or if you have any conditions that may require antibiotics prior to procedures. (Example: recent joint implants)   Pregnancy: If you are pregnant make sure to notify the nursing staff. Not doing so may result in injury to the fetus, including death.  Sickness: If you have a cold, fever, or any active infections, call and cancel or reschedule your procedure. Receiving steroids while having an infection may result in complications. Arrival: You must be in the facility at least 30 minutes prior to your scheduled procedure. Tardiness: Your scheduled time is also the cutoff time. If you do not arrive at least 15 minutes prior to your procedure, you will be rescheduled.  Children: Do not bring any children  with you. Make arrangements to keep them home. Dress appropriately: There is always a possibility that your clothing may get soiled. Avoid  long dresses. Valuables: Do not bring any jewelry or valuables.  Reasons to call and reschedule or cancel your procedure: (Following these recommendations will minimize the risk of a serious complication.) Surgeries: Avoid having procedures within 2 weeks of any surgery. (Avoid for 2 weeks before or after any surgery). Flu Shots: Avoid having procedures within 2 weeks of a flu shots or . (Avoid for 2 weeks before or after immunizations). Barium: Avoid having a procedure within 7-10 days after having had a radiological study involving the use of radiological contrast. (Myelograms, Barium swallow or enema study). Heart attacks: Avoid any elective procedures or surgeries for the initial 6 months after a "Myocardial Infarction" (Heart Attack). Blood thinners: It is imperative that you stop these medications before procedures. Let us  know if you if you take any blood thinner.  Infection: Avoid procedures during or within two weeks of an infection (including chest colds or gastrointestinal problems). Symptoms associated with infections include: Localized redness, fever, chills, night sweats or profuse sweating, burning sensation when voiding, cough, congestion, stuffiness, runny nose, sore throat, diarrhea, nausea, vomiting, cold or Flu symptoms, recent or current infections. It is specially important if the infection is over the area that we intend to treat. Heart and lung problems: Symptoms that may suggest an active cardiopulmonary problem include: cough, chest pain, breathing difficulties or shortness of breath, dizziness, ankle swelling, uncontrolled high or unusually low blood pressure, and/or palpitations. If you are experiencing any of these symptoms, cancel your procedure and contact your primary care physician for an evaluation.  Remember:  Regular Business hours are:  Monday to Thursday 8:00 AM to 4:00 PM  Provider's Schedule: Renaldo Caroli, MD:  Procedure days: Tuesday and Thursday 7:30  AM to 4:00 PM  Cephus Collin, MD:  Procedure days: Monday and Wednesday 7:30 AM to 4:00 PM Last  Updated: 09/26/2023 ______________________________________________________________________      ______________________________________________________________________    General Risks and Possible Complications  Patient Responsibilities: It is important that you read this as it is part of your informed consent. It is our duty to inform you of the risks and possible complications associated with treatments offered to you. It is your responsibility as a patient to read this and to ask questions about anything that is not clear or that you believe was not covered in this document.  Patient's Rights: You have the right to refuse treatment. You also have the right to change your mind, even after initially having agreed to have the treatment done. However, under this last option, if you wait until the last second to change your mind, you may be charged for the materials used up to that point.  Introduction: Medicine is not an Visual merchandiser. Everything in Medicine, including the lack of treatment(s), carries the potential for danger, harm, or loss (which is by definition: Risk). In Medicine, a complication is a secondary problem, condition, or disease that can aggravate an already existing one. All treatments carry the risk of possible complications. The fact that a side effects or complications occurs, does not imply that the treatment was conducted incorrectly. It must be clearly understood that these can happen even when everything is done following the highest safety standards.  No treatment: You can choose not to proceed with the proposed treatment alternative. The "PRO(s)" would include: avoiding the risk of complications associated with the therapy. The "CON(s)"  would include: not getting any of the treatment benefits. These benefits fall under one of three categories: diagnostic; therapeutic; and/or  palliative. Diagnostic benefits include: getting information which can ultimately lead to improvement of the disease or symptom(s). Therapeutic benefits are those associated with the successful treatment of the disease. Finally, palliative benefits are those related to the decrease of the primary symptoms, without necessarily curing the condition (example: decreasing the pain from a flare-up of a chronic condition, such as incurable terminal cancer).  General Risks and Complications: These are associated to most interventional treatments. They can occur alone, or in combination. They fall under one of the following six (6) categories: no benefit or worsening of symptoms; bleeding; infection; nerve damage; allergic reactions; and/or death. No benefits or worsening of symptoms: In Medicine there are no guarantees, only probabilities. No healthcare provider can ever guarantee that a medical treatment will work, they can only state the probability that it may. Furthermore, there is always the possibility that the condition may worsen, either directly, or indirectly, as a consequence of the treatment. Bleeding: This is more common if the patient is taking a blood thinner, either prescription or over the counter (example: Goody Powders, Fish oil, Aspirin , Garlic, etc.), or if suffering a condition associated with impaired coagulation (example: [name], cirrhosis of the liver, low platelet counts, etc.). However, even if you do not have one on these, it can still happen. If you have any of these conditions, or take one of these drugs, make sure to notify your treating physician. Infection: This is more common in patients with a compromised immune system, either due to disease (example: diabetes, cancer, human immunodeficiency virus [HIV], etc.), or due to medications or treatments (example: therapies used to treat cancer and rheumatological diseases). However, even if you do not have one on these, it can still  happen. If you have any of these conditions, or take one of these drugs, make sure to notify your treating physician. Nerve Damage: This is more common when the treatment is an invasive one, but it can also happen with the use of medications, such as those used in the treatment of cancer. The damage can occur to small secondary nerves, or to large primary ones, such as those in the spinal cord and brain. This damage may be temporary or permanent and it may lead to impairments that can range from temporary numbness to permanent paralysis and/or brain death. Allergic Reactions: Any time a substance or material comes in contact with our body, there is the possibility of an allergic reaction. These can range from a mild skin rash (contact dermatitis) to a severe systemic reaction (anaphylactic reaction), which can result in death. Death: In general, any medical intervention can result in death, most of the time due to an unforeseen complication. ______________________________________________________________________

## 2024-04-10 ENCOUNTER — Encounter: Payer: Self-pay | Admitting: Pain Medicine

## 2024-04-10 ENCOUNTER — Ambulatory Visit: Attending: Pain Medicine | Admitting: Pain Medicine

## 2024-04-10 VITALS — BP 130/65 | HR 87 | Temp 97.1°F | Resp 20 | Ht 62.0 in | Wt 195.0 lb

## 2024-04-10 DIAGNOSIS — M25651 Stiffness of right hip, not elsewhere classified: Secondary | ICD-10-CM | POA: Insufficient documentation

## 2024-04-10 DIAGNOSIS — M4316 Spondylolisthesis, lumbar region: Secondary | ICD-10-CM

## 2024-04-10 DIAGNOSIS — M160 Bilateral primary osteoarthritis of hip: Secondary | ICD-10-CM | POA: Insufficient documentation

## 2024-04-10 DIAGNOSIS — M79605 Pain in left leg: Secondary | ICD-10-CM | POA: Insufficient documentation

## 2024-04-10 DIAGNOSIS — M25611 Stiffness of right shoulder, not elsewhere classified: Secondary | ICD-10-CM | POA: Diagnosis present

## 2024-04-10 DIAGNOSIS — M19011 Primary osteoarthritis, right shoulder: Secondary | ICD-10-CM | POA: Diagnosis present

## 2024-04-10 DIAGNOSIS — M79604 Pain in right leg: Secondary | ICD-10-CM | POA: Diagnosis present

## 2024-04-10 DIAGNOSIS — M4310 Spondylolisthesis, site unspecified: Secondary | ICD-10-CM

## 2024-04-10 DIAGNOSIS — M25511 Pain in right shoulder: Secondary | ICD-10-CM | POA: Diagnosis not present

## 2024-04-10 DIAGNOSIS — G8929 Other chronic pain: Secondary | ICD-10-CM | POA: Diagnosis not present

## 2024-04-10 DIAGNOSIS — M25551 Pain in right hip: Secondary | ICD-10-CM | POA: Diagnosis present

## 2024-04-10 DIAGNOSIS — M545 Low back pain, unspecified: Secondary | ICD-10-CM | POA: Diagnosis present

## 2024-04-10 DIAGNOSIS — M51369 Other intervertebral disc degeneration, lumbar region without mention of lumbar back pain or lower extremity pain: Secondary | ICD-10-CM

## 2024-04-10 DIAGNOSIS — R937 Abnormal findings on diagnostic imaging of other parts of musculoskeletal system: Secondary | ICD-10-CM

## 2024-04-10 DIAGNOSIS — M47816 Spondylosis without myelopathy or radiculopathy, lumbar region: Secondary | ICD-10-CM

## 2024-04-10 NOTE — Progress Notes (Signed)
 Safety precautions to be maintained throughout the outpatient stay will include: orient to surroundings, keep bed in low position, maintain call bell within reach at all times, provide assistance with transfer out of bed and ambulation.

## 2024-04-16 ENCOUNTER — Ambulatory Visit (HOSPITAL_BASED_OUTPATIENT_CLINIC_OR_DEPARTMENT_OTHER): Admitting: Pain Medicine

## 2024-04-16 ENCOUNTER — Ambulatory Visit
Admission: RE | Admit: 2024-04-16 | Discharge: 2024-04-16 | Disposition: A | Discharge status: Home / Self Care | Source: Ambulatory Visit | Attending: Pain Medicine | Admitting: Pain Medicine

## 2024-04-16 ENCOUNTER — Encounter: Payer: Self-pay | Admitting: Pain Medicine

## 2024-04-16 VITALS — BP 103/51 | HR 88 | Temp 97.2°F | Resp 18 | Ht 62.0 in | Wt 195.0 lb

## 2024-04-16 DIAGNOSIS — M25651 Stiffness of right hip, not elsewhere classified: Secondary | ICD-10-CM

## 2024-04-16 DIAGNOSIS — M19011 Primary osteoarthritis, right shoulder: Secondary | ICD-10-CM | POA: Diagnosis present

## 2024-04-16 DIAGNOSIS — R1031 Right lower quadrant pain: Secondary | ICD-10-CM | POA: Diagnosis present

## 2024-04-16 DIAGNOSIS — M1611 Unilateral primary osteoarthritis, right hip: Secondary | ICD-10-CM | POA: Insufficient documentation

## 2024-04-16 DIAGNOSIS — Z9981 Dependence on supplemental oxygen: Secondary | ICD-10-CM | POA: Insufficient documentation

## 2024-04-16 DIAGNOSIS — M25551 Pain in right hip: Secondary | ICD-10-CM | POA: Insufficient documentation

## 2024-04-16 DIAGNOSIS — G8929 Other chronic pain: Secondary | ICD-10-CM | POA: Diagnosis present

## 2024-04-16 DIAGNOSIS — L03116 Cellulitis of left lower limb: Secondary | ICD-10-CM | POA: Insufficient documentation

## 2024-04-16 DIAGNOSIS — B353 Tinea pedis: Secondary | ICD-10-CM | POA: Insufficient documentation

## 2024-04-16 DIAGNOSIS — M79605 Pain in left leg: Secondary | ICD-10-CM | POA: Insufficient documentation

## 2024-04-16 DIAGNOSIS — F3341 Major depressive disorder, recurrent, in partial remission: Secondary | ICD-10-CM | POA: Insufficient documentation

## 2024-04-16 DIAGNOSIS — M25511 Pain in right shoulder: Secondary | ICD-10-CM

## 2024-04-16 DIAGNOSIS — M160 Bilateral primary osteoarthritis of hip: Secondary | ICD-10-CM | POA: Insufficient documentation

## 2024-04-16 DIAGNOSIS — M25611 Stiffness of right shoulder, not elsewhere classified: Secondary | ICD-10-CM

## 2024-04-16 DIAGNOSIS — I110 Hypertensive heart disease with heart failure: Secondary | ICD-10-CM | POA: Insufficient documentation

## 2024-04-16 DIAGNOSIS — F17200 Nicotine dependence, unspecified, uncomplicated: Secondary | ICD-10-CM | POA: Insufficient documentation

## 2024-04-16 DIAGNOSIS — M79604 Pain in right leg: Secondary | ICD-10-CM | POA: Diagnosis present

## 2024-04-16 DIAGNOSIS — M79601 Pain in right arm: Secondary | ICD-10-CM | POA: Insufficient documentation

## 2024-04-16 DIAGNOSIS — I700 Atherosclerosis of aorta: Secondary | ICD-10-CM | POA: Insufficient documentation

## 2024-04-16 DIAGNOSIS — J449 Chronic obstructive pulmonary disease, unspecified: Secondary | ICD-10-CM | POA: Diagnosis present

## 2024-04-16 DIAGNOSIS — B370 Candidal stomatitis: Secondary | ICD-10-CM | POA: Insufficient documentation

## 2024-04-16 DIAGNOSIS — J431 Panlobular emphysema: Secondary | ICD-10-CM | POA: Insufficient documentation

## 2024-04-16 MED ORDER — METHYLPREDNISOLONE ACETATE 80 MG/ML IJ SUSP
INTRAMUSCULAR | Status: AC
Start: 2024-04-16 — End: 2024-04-16
  Filled 2024-04-16: qty 2

## 2024-04-16 MED ORDER — ROPIVACAINE HCL 2 MG/ML IJ SOLN
9.0000 mL | Freq: Once | INTRAMUSCULAR | Status: AC
  Administered 2024-04-16: 9 mL via INTRA_ARTICULAR

## 2024-04-16 MED ORDER — IOHEXOL 180 MG/ML  SOLN
10.0000 mL | Freq: Once | INTRAMUSCULAR | Status: AC
  Administered 2024-04-16: 10 mL via INTRA_ARTICULAR

## 2024-04-16 MED ORDER — IOHEXOL 180 MG/ML  SOLN
INTRAMUSCULAR | Status: AC
  Filled 2024-04-16: qty 10

## 2024-04-16 MED ORDER — FENTANYL CITRATE (PF) 100 MCG/2ML IJ SOLN
INTRAMUSCULAR | Status: AC
Start: 2024-04-16 — End: 2024-04-16
  Filled 2024-04-16: qty 2

## 2024-04-16 MED ORDER — ROPIVACAINE HCL 2 MG/ML IJ SOLN
INTRAMUSCULAR | Status: AC
  Filled 2024-04-16: qty 20

## 2024-04-16 MED ORDER — CEFAZOLIN SODIUM-DEXTROSE 1-4 GM/50ML-% IV SOLN
1.0000 g | Freq: Once | INTRAVENOUS | Status: AC
  Administered 2024-04-16: 1 g via INTRAVENOUS

## 2024-04-16 MED ORDER — MIDAZOLAM HCL 5 MG/5ML IJ SOLN
INTRAMUSCULAR | Status: AC
Start: 2024-04-16 — End: 2024-04-16
  Filled 2024-04-16: qty 5

## 2024-04-16 MED ORDER — METHYLPREDNISOLONE ACETATE 80 MG/ML IJ SUSP
80.0000 mg | Freq: Once | INTRAMUSCULAR | Status: AC
  Administered 2024-04-16: 80 mg via INTRA_ARTICULAR

## 2024-04-16 MED ORDER — LIDOCAINE HCL 2 % IJ SOLN
20.0000 mL | Freq: Once | INTRAMUSCULAR | Status: AC
  Administered 2024-04-16: 400 mg

## 2024-04-16 MED ORDER — FENTANYL CITRATE (PF) 100 MCG/2ML IJ SOLN: 25.0000 ug | INTRAMUSCULAR | Status: DC | PRN

## 2024-04-16 MED ORDER — CEFAZOLIN SODIUM 1 G IJ SOLR
INTRAMUSCULAR | Status: AC
  Filled 2024-04-16: qty 10

## 2024-04-16 MED ORDER — PENTAFLUOROPROP-TETRAFLUOROETH EX AERO
INHALATION_SPRAY | Freq: Once | CUTANEOUS | Status: AC
  Administered 2024-04-16: 30 via TOPICAL

## 2024-04-16 MED ORDER — LIDOCAINE HCL 2 % IJ SOLN
INTRAMUSCULAR | Status: AC
  Filled 2024-04-16: qty 20

## 2024-04-16 MED ORDER — MIDAZOLAM HCL 5 MG/5ML IJ SOLN
0.5000 mg | Freq: Once | INTRAMUSCULAR | Status: AC
  Administered 2024-04-16: 2 mg via INTRAVENOUS

## 2024-04-16 NOTE — Patient Instructions (Signed)
 ____________________________________________________________________________________________  Post-Procedure Discharge Instructions  Instructions:  Apply ice:   Purpose: This will minimize any swelling and discomfort after procedure.   When: Day of procedure, as soon as you get home.  How: Fill a plastic sandwich bag with crushed ice. Cover it with a small towel and apply to injection site.  How long: (15 min on, 15 min off) Apply for 15 minutes then remove x 15 minutes.  Repeat sequence on day of procedure, until you go to bed.  Apply heat:   Purpose: To treat any soreness and discomfort from the procedure.  When: Starting the next day after the procedure.  How: Apply heat to procedure site starting the day following the procedure.  How long: May continue to repeat daily, until discomfort goes away.  Food intake: Start with clear liquids (like water) and advance to regular food, as tolerated.   Physical activities: Keep activities to a minimum for the first 8 hours after the procedure. After that, then as tolerated.  Driving: If you have received any sedation, be responsible and do not drive. You are not allowed to drive for 24 hours after having sedation.  Blood thinner: (Applies only to those taking blood thinners) You may restart your blood thinner 6 hours after your procedure.  Insulin: (Applies only to Diabetic patients taking insulin) As soon as you can eat, you may resume your normal dosing schedule.  Infection prevention: Keep procedure site clean and dry. Shower daily and clean area with soap and water.  Post-procedure Pain Diary: Extremely important that this be done correctly and accurately. Recorded information will be used to determine the next step in treatment. For the purpose of accuracy, follow these rules:  Evaluate only the area treated. Do not report or include pain from an untreated area. For the purpose of this evaluation, ignore all other areas of pain,  except for the treated area.  After your procedure, avoid taking a long nap and attempting to complete the pain diary after you wake up. Instead, set your alarm clock to go off every hour, on the hour, for the initial 8 hours after the procedure. Document the duration of the numbing medicine, and the relief you are getting from it.  Do not go to sleep and attempt to complete it later. It will not be accurate. If you received sedation, it is likely that you were given a medication that may cause amnesia. Because of this, completing the diary at a later time may cause the information to be inaccurate. This information is needed to plan your care.  Follow-up appointment: Keep your post-procedure follow-up evaluation appointment after the procedure (usually 2 weeks for most procedures, 6 weeks for radiofrequencies). DO NOT FORGET to bring you pain diary with you.   Expect: (What should I expect to see with my procedure?)  From numbing medicine (AKA: Local Anesthetics): Numbness or decrease in pain. You may also experience some weakness, which if present, could last for the duration of the local anesthetic.  Onset: Full effect within 15 minutes of injected.  Duration: It will depend on the type of local anesthetic used. On the average, 1 to 8 hours.   From steroids (Applies only if steroids were used): Decrease in swelling or inflammation. Once inflammation is improved, relief of the pain will follow.  Onset of benefits: Depends on the amount of swelling present. The more swelling, the longer it will take for the benefits to be seen. In some cases, up to 10 days.  Duration: Steroids will stay in the system x 2 weeks. Duration of benefits will depend on multiple posibilities including persistent irritating factors.  Side-effects: If present, they may typically last 2 weeks (the duration of the steroids).  Frequent: Cramps (if they occur, drink Gatorade and take over-the-counter Magnesium 450-500 mg  once to twice a day); water retention with temporary weight gain; increases in blood sugar; decreased immune system response; increased appetite.  Occasional: Facial flushing (red, warm cheeks); mood swings; menstrual changes.  Uncommon: Long-term decrease or suppression of natural hormones; bone thinning. (These are more common with higher doses or more frequent use. This is why we prefer that our patients avoid having any injection therapies in other practices.)   Very Rare: Severe mood changes; psychosis; aseptic necrosis.  From procedure: Some discomfort is to be expected once the numbing medicine wears off. This should be minimal if ice and heat are applied as instructed.  Call if: (When should I call?)  You experience numbness and weakness that gets worse with time, as opposed to wearing off.  New onset bowel or bladder incontinence. (Applies only to procedures done in the spine)  Emergency Numbers:  Durning business hours (Monday - Thursday, 8:00 AM - 4:00 PM) (Friday, 9:00 AM - 12:00 Noon): [phone]  After hours: [phone]  NOTE: If you are having a problem and are unable connect with, or to talk to a provider, then go to your nearest urgent care or emergency department. If the problem is serious and urgent, please call 911. ____________________________________________________________________________________________

## 2024-04-16 NOTE — Progress Notes (Signed)
 PROVIDER NOTE: Interpretation of information contained herein should be left to medically-trained personnel. Specific patient instructions are provided elsewhere under Patient Instructions section of medical record. This document was created in part using STT-dictation technology, any transcriptional errors that may result from this process are unintentional.  Patient: Theresa [name] Type: Established DOB: [birth year] MRN: [id number] PCP: Odell Tor Edra CINDERELLA, MD  Service: Procedure DOS: 04/16/2024 Setting: Ambulatory Location: Ambulatory outpatient facility Delivery: Face-to-face Provider: Eric DELENA Como, MD Specialty: Interventional Pain Management Specialty designation: 09 Location: Outpatient facility Ref. Prov.: Como Eric, MD       Interventional Therapy   Procedure No.1: Glenohumeral and acromioclavicular joint Injection #1  Laterality: Right (-RT)  Level: Shoulder  Target: Glenohumeral Joint (shoulder) Location: Intra-articular  Region: Entire Shoulder Area Approach: Anterior approach. Type of procedure: Percutaneous joint injection  Purpose: Diagnostic/Therapeutic Indications: Shoulder pain severe enough to impact quality of life or function. Rationale (medical necessity): procedure needed and proper for the diagnosis and/or treatment of [name]'s medical symptoms and needs. 1. Chronic shoulder pain (Right)   2. Acromioclavicular joint DJD (Right)   3. Arthralgia of acromioclavicular joint (Right)   4. Chronic upper extremity pain (Right-sided)   5. Impaired range of motion of right shoulder    Procedure No.2: Anterior IA Hip injection #1  Laterality: Right (-RT)  Approach: Percutaneous anterolateral approach. Level: Lower pelvic and hip joint level.  Imaging: Fluoroscopy-guided Non-spinal ([id number]) Target: Intra-articular aspect of the hip joint  Region: Hip joint proper. Femoral region Procedure Type: Percutaneous injection  Purpose:  Diagnostic/Therapeutic Indications: Hip pain severe enough to impact quality of life or function. Rationale (medical necessity): procedure needed and proper for the diagnosis and/or treatment of Ms. Garton's medical symptoms and needs. 1. Chronic groin pain (Right)   2. Chronic hip pain (Right)   3. Impaired range of motion of right hip   4. Osteoarthritis of hip (Right)    Anesthesia: Local anesthesia (1-2% Lidocaine ) Anxiolysis: IV Versed  2.0 mg Sedation: Moderate Sedation None required. No Fentanyl  administered.         DOS: 04/16/2024  Performed by: Eric DELENA Como, MD  NAS-11 Pain score:   Pre-procedure: 9 /10   Post-procedure: 2 /10       Position  Prep  Materials for procedure No.1:  Position: Supine Prep solution: ChloraPrep (2% chlorhexidine  gluconate and 70% isopropyl alcohol) Prep Area: Entire shoulder Area Materials:  Tray: Block Needle(s):  Type: Spinal  Gauge (G): 22  Length: 3.5-in  Qty: 1  Position / Prep / Materials for procedure No.2:  Position: Supine  Prep solution: ChloraPrep (2% chlorhexidine  gluconate and 70% isopropyl alcohol) Prep Area:  Entire Posterolateral hip area. Materials:  Tray: Block tray Needle(s):  Type: Spinal  Gauge (G): 22  Length: 7-in  Qty: 1  H&P (Pre-op Assessment):  Theresa [name] is a 68 y.o. (year old), female patient, seen today for interventional treatment. She  has a past surgical history that includes Abdominal hysterectomy; Tonsillectomy; Carpal tunnel release (Left, 2012); Tubal ligation; epigastric hernia repair (N/A, 08/13/2018); Umbilical hernia repair (N/A, 08/13/2018); RIGHT/LEFT HEART CATH AND CORONARY ANGIOGRAPHY (N/A, 07/11/2019); TEE without cardioversion (N/A, 04/29/2022); Cardioversion (N/A, 04/29/2022); Esophagogastroduodenoscopy (N/A, 06/21/2022); cataract surgery (Bilateral); Cardioversion (N/A, 01/30/2024); Cardioversion (N/A, 02/19/2024); Flexible sigmoidoscopy (N/A, 03/05/2024);  Esophagogastroduodenoscopy (N/A, 03/05/2024); Colonoscopy (N/A, 03/05/2024); and Polypectomy (03/05/2024). [name] has a current medication list which includes the following prescription(s): acetaminophen , albuterol , amiodarone , buspirone , cholecalciferol , dapagliflozin  propanediol, diltiazem , trelegy ellipta, furoscix , hydrocodone -acetaminophen , ipratropium-albuterol , naloxone , omeprazole , oxygen -helium, potassium chloride  sa,  roflumilast , sertraline , spironolactone , mounjaro , torsemide , and amoxicillin , and the following Facility-Administered Medications: fentanyl . Her primarily concern today is the Shoulder Pain (right) and Hip Pain (right)  Initial Vital Signs:  Pulse/HCG Rate: 88ECG Heart Rate: 79 Temp: (!) 97.2 F (36.2 C) Resp: 20 BP: 116/82 SpO2: 92 % (o2@5Lnc )  BMI: Estimated body mass index is 35.67 kg/m as calculated from the following:   Height as of this encounter: 5' 2 (1.575 m).   Weight as of this encounter: 195 lb (88.5 kg).  Risk Assessment: Allergies: Reviewed. She is allergic to gabapentin , codeine, and meloxicam .  Allergy Precautions: None required Coagulopathies: Reviewed. None identified.  Blood-thinner therapy: None at this time Active Infection(s): Reviewed. None identified. Theresa [name] is afebrile  Site Confirmation: Theresa [name] was asked to confirm the procedure and laterality before marking the site Procedure checklist: Completed Consent: Before the procedure and under the influence of no sedative(s), amnesic(s), or anxiolytics, the patient was informed of the treatment options, risks and possible complications. To fulfill our ethical and legal obligations, as recommended by the American Medical Association's Code of Ethics, I have informed the patient of my clinical impression; the nature and purpose of the treatment or procedure; the risks, benefits, and possible complications of the intervention; the alternatives, including doing nothing; the risk(s) and  benefit(s) of the alternative treatment(s) or procedure(s); and the risk(s) and benefit(s) of doing nothing. The patient was provided information about the general risks and possible complications associated with the procedure. These may include, but are not limited to: failure to achieve desired goals, infection, bleeding, organ or nerve damage, allergic reactions, paralysis, and death. In addition, the patient was informed of those risks and complications associated to the procedure, such as failure to decrease pain; infection; bleeding; organ or nerve damage with subsequent damage to sensory, motor, and/or autonomic systems, resulting in permanent pain, numbness, and/or weakness of one or several areas of the body; allergic reactions; (i.e.: anaphylactic reaction); and/or death. Furthermore, the patient was informed of those risks and complications associated with the medications. These include, but are not limited to: allergic reactions (i.e.: anaphylactic or anaphylactoid reaction(s)); adrenal axis suppression; blood sugar elevation that in diabetics may result in ketoacidosis or comma; water  retention that in patients with history of congestive heart failure may result in shortness of breath, pulmonary edema, and decompensation with resultant heart failure; weight gain; swelling or edema; medication-induced neural toxicity; particulate matter embolism and blood vessel occlusion with resultant organ, and/or nervous system infarction; and/or aseptic necrosis of one or more joints. Finally, the patient was informed that Medicine is not an exact science; therefore, there is also the possibility of unforeseen or unpredictable risks and/or possible complications that may result in a catastrophic outcome. The patient indicated having understood very clearly. We have given the patient no guarantees and we have made no promises. Enough time was given to the patient to ask questions, all of which were answered to  the patient's satisfaction. Ms. Baskins has indicated that she wanted to continue with the procedure. Attestation: I, the ordering provider, attest that I have discussed with the patient the benefits, risks, side-effects, alternatives, likelihood of achieving goals, and potential problems during recovery for the procedure that I have provided informed consent. Date  Time: 04/16/2024  9:05 AM  Pre-Procedure Preparation:  Monitoring: As per clinic protocol. Respiration, ETCO2, SpO2, BP, heart rate and rhythm monitor placed and checked for adequate function Safety Precautions: Patient was assessed for positional comfort and pressure points before starting  the procedure. Time-out: I initiated and conducted the Time-out before starting the procedure, as per protocol. The patient was asked to participate by confirming the accuracy of the Time Out information. Verification of the correct person, site, and procedure were performed and confirmed by me, the nursing staff, and the patient. Time-out conducted as per Joint Commission's Universal Protocol (UP.01.01.01). Time: 1002 Start Time: 1002 hrs.  Description  Narrative of Procedure No.1:          Rationale (medical necessity): procedure needed and proper for the diagnosis and/or treatment of the patient's medical symptoms and needs. Procedural Technique Safety Precautions: Aspiration looking for blood return was conducted prior to all injections. At no point did we inject any substances, as a needle was being advanced. No attempts were made at seeking any paresthesias. Safe injection practices and needle disposal techniques used. Medications properly checked for expiration dates. SDV (single dose vial) medications used. Description of the Procedure: Protocol guidelines were followed. The patient was placed in position over the procedure table. The target area was identified and the area prepped in the usual manner. Skin & deeper tissues infiltrated with  local anesthetic. Appropriate amount of time allowed to pass for local anesthetics to take effect. The procedure needles were then advanced to the target area. Proper needle placement secured. Negative aspiration confirmed. Solution injected in intermittent fashion, asking for systemic symptoms every 0.5cc of injectate. The needles were then removed and the area cleansed, making sure to leave some of the prepping solution back to take advantage of its long term bactericidal properties.              Narrative of Procedure No.2:  Rationale (medical necessity): procedure needed and proper for the diagnosis and/or treatment of the patient's medical symptoms and needs. Procedural Technique Safety Precautions: Aspiration looking for blood return was conducted prior to all injections. At no point did we inject any substances, as a needle was being advanced. No attempts were made at seeking any paresthesias. Safe injection practices and needle disposal techniques used. Medications properly checked for expiration dates. SDV (single dose vial) medications used. Description of the Procedure: Protocol guidelines were followed. The patient was assisted into a comfortable position. The target area was identified and the area prepped in the usual manner. Skin & deeper tissues infiltrated with local anesthetic. Appropriate amount of time allowed to pass for local anesthetics to take effect. The procedure needles were then advanced to the target area. Proper needle placement secured. Negative aspiration confirmed. Solution injected in intermittent fashion, asking for systemic symptoms every 0.5cc of injectate. The needles were then removed and the area cleansed, making sure to leave some of the prepping solution back to take advantage of its long term bactericidal properties.  Technical description of procedure:  Skin & deeper tissues infiltrated with local anesthetic. Appropriate amount of time allowed to pass for  local anesthetics to take effect. The procedure needles were then advanced to the target area. Proper needle placement secured. Negative aspiration confirmed. Solution injected in intermittent fashion, asking for systemic symptoms every 0.5cc of injectate. The needles were then removed and the area cleansed, making sure to leave some of the prepping solution back to take advantage of its long term bactericidal properties.                      Vitals:   04/16/24 1000 04/16/24 1005 04/16/24 1009 04/16/24 1015  BP: (!) 114/59 119/60 (!) 110/58 (!) 103/51  Pulse:  Resp: 18 18 19 18   Temp:      SpO2: 97% 96% 97% 95%  Weight:      Height:        End Time: 1008 hrs.  Imaging Guidance (Non-Spinal) for procedure #1:  Type of Imaging Technique: Fluoroscopy Guidance (Non-Spinal) Indication(s): Fluoroscopy guidance for needle placement to enhance accuracy in procedures requiring precise needle localization for targeted delivery of medication in or near specific anatomical locations not easily accessible without such real-time imaging assistance. Exposure Time: Please see nurses notes. Contrast: None used. Fluoroscopic Guidance: I was personally present during the use of fluoroscopy. Tunnel Vision Technique used to obtain the best possible view of the target area. Parallax error corrected before commencing the procedure. Direction-depth-direction technique used to introduce the needle under continuous pulsed fluoroscopy. Once target was reached, antero-posterior, oblique, and lateral fluoroscopic projection used confirm needle placement in all planes. Images permanently stored in EMR. Interpretation: No contrast injected. I personally interpreted the imaging intraoperatively. Adequate needle placement confirmed in multiple planes. Permanent images saved into the patient's record.  Imaging Guidance (Non-Spinal) for procedure #2:  Type of Imaging Technique: Fluoroscopy Guidance  (Non-Spinal) Indication(s): Fluoroscopy guidance for needle placement to enhance accuracy in procedures requiring precise needle localization for targeted delivery of medication in or near specific anatomical locations not easily accessible without such real-time imaging assistance. Exposure Time: Please see nurses notes. Contrast: Before injecting any contrast, we confirmed that the patient did not have an allergy to iodine, shellfish, or radiological contrast. Once satisfactory needle placement was completed at the desired level, radiological contrast was injected. Contrast injected under live fluoroscopy. No contrast complications. See chart for type and volume of contrast used. Fluoroscopic Guidance: I was personally present during the use of fluoroscopy. Tunnel Vision Technique used to obtain the best possible view of the target area. Parallax error corrected before commencing the procedure. Direction-depth-direction technique used to introduce the needle under continuous pulsed fluoroscopy. Once target was reached, antero-posterior, oblique, and lateral fluoroscopic projection used confirm needle placement in all planes. Images permanently stored in EMR. Interpretation: I personally interpreted the imaging intraoperatively. Adequate needle placement confirmed in multiple planes. Appropriate spread of contrast into desired area was observed. No evidence of afferent or efferent intravascular uptake. Permanent images saved into the patient's record.  Antibiotic Prophylaxis:   Anti-infectives (From admission, onward)    Start     Dose/Rate Route Frequency Ordered Stop   04/16/24 0945  ceFAZolin  (ANCEF ) IVPB 1 g/50 mL premix        1 g 100 mL/hr over 30 Minutes Intravenous  Once 04/16/24 0937        Indication(s): Procedural Prophylaxis.due to recent episode of lower extremity cellulitis.  Post-operative Assessment:  Post-procedure Vital Signs:  Pulse/HCG Rate: 8879 Temp: (!) 97.2 F  (36.2 C) Resp: 18 BP: (!) 103/51 SpO2: 95 %  EBL: None  Complications: No immediate post-treatment complications observed by team, or reported by patient.  Note: The patient tolerated the entire procedure well. A repeat set of vitals were taken after the procedure and the patient was kept under observation following institutional policy, for this type of procedure. Post-procedural neurological assessment was performed, showing return to baseline, prior to discharge. The patient was provided with post-procedure discharge instructions, including a section on how to identify potential problems. Should any problems arise concerning this procedure, the patient was given instructions to immediately contact us , at any time, without hesitation. In any case, we plan to contact the patient by  telephone for a follow-up status report regarding this interventional procedure.  Comments:  No additional relevant information.  Plan of Care (POC)  Orders:  Orders Placed This Encounter  Procedures   HIP INJECTION    Scheduling Instructions:     Side: Right-sided     Sedation: Patient's choice.     Date: 04/16/2024   SHOULDER INJECTION    Scheduling Instructions:     Procedure: Intra-articular shoulder (Glenohumeral) joint and (AC) Acromioclavicular joint injection     Side: Right-sided     Level: Glenohumeral joint and (AC) Acromioclavicular joint     Sedation: Patient's choice.     Date: 04/16/2024    Where will this procedure be performed?:   [facility]   [facility] C-ARM 1-60 MIN NO REPORT    Intraoperative interpretation by procedural physician at [facility].    Standing Status:   Standing    Number of Occurrences:   1    Reason for exam::   Assistance in needle guidance and placement for procedures requiring needle placement in or near specific anatomical locations not easily accessible without such assistance.   Informed Consent Details: Physician/Practitioner  Attestation; Transcribe to consent form and obtain patient signature    Nursing Order: Transcribe to consent form and obtain patient signature. Note: Always confirm laterality of pain with Ms. Pastor, before procedure.    Physician/Practitioner attestation of informed consent for procedure/surgical case:   I, the physician/practitioner, attest that I have discussed with the patient the benefits, risks, side effects, alternatives, likelihood of achieving goals and potential problems during recovery for the procedure that I have provided informed consent.    Procedure:   Hip injection    Physician/Practitioner performing the procedure:   Saturnino Liew A. Tanya, MD    Indication/Reason:   Hip Joint Pain (Arthralgia)   Provide equipment / supplies at bedside    Procedure tray: Block Tray (Disposable  single use) Skin infiltration needle: Regular 1.5-in, 25-G, (x1) Block Needle type: Spinal Amount/quantity: 1 Size: Long (7-inch) Gauge: 22G    Standing Status:   Standing    Number of Occurrences:   1    Specify:   Block Tray   Informed Consent Details: Physician/Practitioner Attestation; Transcribe to consent form and obtain patient signature    Note: Always confirm laterality of pain with Ms. Pastor, before procedure.    Physician/Practitioner attestation of informed consent for procedure/surgical case:   I, the physician/practitioner, attest that I have discussed with the patient the benefits, risks, side effects, alternatives, likelihood of achieving goals and potential problems during recovery for the procedure that I have provided informed consent.    Procedure:   Intra-articular shoulder joint injection under fluoroscopic guidance    Physician/Practitioner performing the procedure:   Areona Homer A. Tanya, MD    Indication/Reason:   Chronic shoulder pain secondary to shoulder arthropathy   Provide equipment / supplies at bedside    Procedure tray: Block Tray (Disposable  single  use) Skin infiltration needle: Regular 1.5-in, 25-G, (x1) Block Needle type: Spinal Amount/quantity: 1 Size: Regular (3.5-inch) Gauge: 22G    Standing Status:   Standing    Number of Occurrences:   1    Specify:   Block Tray   Monitor O2 SATs    Monitor O2 SATs during and after procedure. Administer supplemental O2 if saturation decreases with sedation.    Standing Status:   Standing    Number of Occurrences:   1   Keep Oxygen   Setup At Bedside    Discontinue once pulse oxymetry is back to normal levels for patient.    Standing Status:   Standing    Number of Occurrences:   1   Saline lock IV    Have LR [id number] mL available and administer at 125 mL/hr if patient becomes hypotensive.    Standing Status:   Standing    Number of Occurrences:   1    Opioid Analgesic(s):  Analgesic: Hydrocodone /APAP 5/325 one tablet every 8 hours (15 mg/day of hydrocodone ) MME/day: 15 mg/day.    Medications ordered for procedure: Meds ordered this encounter  Medications   iohexol  (OMNIPAQUE ) 180 MG/ML injection 10 mL    Must be Myelogram-compatible. If not available, you may substitute with a water -soluble, non-ionic, hypoallergenic, myelogram-compatible radiological contrast medium.   lidocaine  (XYLOCAINE ) 2 % (with pres) injection 400 mg   pentafluoroprop-tetrafluoroeth (GEBAUERS) aerosol   midazolam  (VERSED ) 5 MG/5ML injection 0.5-2 mg    Make sure Flumazenil is available in the pyxis when using this medication. If oversedation occurs, administer 0.2 mg IV over 15 sec. If after 45 sec no response, administer 0.2 mg again over 1 min; may repeat at 1 min intervals; not to exceed 4 doses (1 mg)   fentaNYL  (SUBLIMAZE ) injection 25-50 mcg    Make sure Narcan  is available in the pyxis when using this medication. In the event of respiratory depression (RR< 8/min): Titrate NARCAN  (naloxone ) in increments of 0.1 to 0.2 mg IV at 2-3 minute intervals, until desired degree of reversal.   methylPREDNISolone   acetate (DEPO-MEDROL ) injection 80 mg   ropivacaine  (PF) 2 mg/mL (0.2%) (NAROPIN ) injection 9 mL   methylPREDNISolone  acetate (DEPO-MEDROL ) injection 80 mg   ropivacaine  (PF) 2 mg/mL (0.2%) (NAROPIN ) injection 9 mL   ceFAZolin  (ANCEF ) IVPB 1 g/50 mL premix    Antibiotic Indication::   Surgical Prophylaxis    Other Indication::   Procedure Prophylaxis   Medications administered: We administered iohexol , lidocaine , pentafluoroprop-tetrafluoroeth, midazolam , methylPREDNISolone  acetate, ropivacaine  (PF) 2 mg/mL (0.2%), methylPREDNISolone  acetate, ropivacaine  (PF) 2 mg/mL (0.2%), and ceFAZolin .  See the medical record for exact dosing, route, and time of administration.    Interventional Therapies  Risk Factors  Considerations  Medical Comorbidities:  COPD  Emphysema  Chronic Smoker  O2 dependent  SOB  CHF  Hx. of A-Fib (Cardioverted)  Obesity  CAD   NOTE: NO further Lumbar RFA until BMI <35. Patient taken off of Xarelto  after cardioversion.   Planned  Pending:   Diagnostic/therapeutic right IA hip and trochanteric bursa inj. #1    Under consideration:   Diagnostic bilateral cervical facet MBB #1  Palliative left L2-3 LESI #4  NOTE: NO further Lumbar RFA until BMI <35.   Completed:   Palliative right lumbar facet MBB x7 (09/20/2022) (100/100/100x5days/0)  Palliative left lumbar facet MBB x6 (11/11/2021) (100/100/0/0)  Therapeutic left L2-3 LESI x4 (05/09/2023) (8-0/10) (100/100/50/>50)  Diagnostic/palliative left L4 TFESI x1 (12/26/2017) (100/100/50/>50)  Diagnostic/palliative left L4-5 LESI x2 (07/30/2019) (100/100/0/0)  Diagnostic/palliative right L4 TFESI x1 (01/25/2018) (100/100/50/50)  Diagnostic/palliative right L4-5 LESI x1 (01/25/2018) (100/100/50/50)  Palliative left lumbar facet RFA x1 (06/27/2017) (0/0/100/>50)  Palliative right lumbar facet RFA x1 (10/03/2017) (100/100/75)    Therapeutic  Palliative (PRN) options:   Evaluation needed before each determination.    Completed by other providers:      Pharmacotherapy  Nonopioids transferred 08/03/2020: Gabapentin  and baclofen  Note: Patient taken off of Xarelto  after cardioversion.      Follow-up plan:   Return in  about 2 weeks (around 04/30/2024) for (Face2F), (PPE).     Recent Visits Date Type Provider Dept  04/10/24 Office Visit Tanya Glisson, MD [facility]  01/24/24 Office Visit Tanya Glisson, MD [facility]  Showing recent visits within past 90 days and meeting all other requirements Today's Visits Date Type Provider Dept  04/16/24 Procedure visit Tanya Glisson, MD [facility]  Showing today's visits and meeting all other requirements Future Appointments Date Type Provider Dept  04/23/24 Appointment Patel, Seema K, NP [facility]  05/02/24 Appointment Tanya Glisson, MD [facility]  Showing future appointments within next 90 days and meeting all other requirements   Disposition: Discharge home  Discharge (Date  Time): 04/16/2024; 1020 hrs.   Primary Care Physician: Odell Chard, Edra GRADE, MD Location: [facility] Out[facility] Facility Note by: Glisson DELENA Tanya, MD (TTS technology used. I apologize for any typographical errors that were not detected and corrected.) Date: 04/16/2024; Time: 10:45 AM  Disclaimer:  Medicine is not an Visual merchandiser. The only guarantee in medicine is that nothing is guaranteed. It is important to note that the decision to proceed with this intervention was based on the information collected from the patient. The Data and conclusions were drawn from the patient's questionnaire, the interview, and the physical examination. Because the information was provided in large part by the patient, it cannot be guaranteed that it has not been purposely or unconsciously manipulated. Every effort has been made to obtain as much relevant data as possible for this evaluation. It is important to  note that the conclusions that lead to this procedure are derived in large part from the available data. Always take into account that the treatment will also be dependent on availability of resources and existing treatment guidelines, considered by other Pain Management Practitioners as being common knowledge and practice, at the time of the intervention. For Medico-Legal purposes, it is also important to point out that variation in procedural techniques and pharmacological choices are the acceptable norm. The indications, contraindications, technique, and results of the above procedure should only be interpreted and judged by a Board-Certified Interventional Pain Specialist with extensive familiarity and expertise in the same exact procedure and technique.

## 2024-04-17 ENCOUNTER — Encounter: Admitting: Nurse Practitioner

## 2024-04-17 ENCOUNTER — Telehealth: Payer: Self-pay

## 2024-04-17 NOTE — Telephone Encounter (Signed)
 No issues post-procedure.

## 2024-04-23 ENCOUNTER — Encounter: Payer: Self-pay | Admitting: Nurse Practitioner

## 2024-04-23 ENCOUNTER — Ambulatory Visit: Attending: Nurse Practitioner | Admitting: Nurse Practitioner

## 2024-04-23 DIAGNOSIS — G8929 Other chronic pain: Secondary | ICD-10-CM | POA: Diagnosis present

## 2024-04-23 DIAGNOSIS — M25552 Pain in left hip: Secondary | ICD-10-CM | POA: Diagnosis present

## 2024-04-23 DIAGNOSIS — Z79891 Long term (current) use of opiate analgesic: Secondary | ICD-10-CM | POA: Diagnosis present

## 2024-04-23 DIAGNOSIS — M79605 Pain in left leg: Secondary | ICD-10-CM | POA: Diagnosis present

## 2024-04-23 DIAGNOSIS — M79604 Pain in right leg: Secondary | ICD-10-CM | POA: Diagnosis present

## 2024-04-23 DIAGNOSIS — M545 Low back pain, unspecified: Secondary | ICD-10-CM | POA: Diagnosis present

## 2024-04-23 DIAGNOSIS — Z79899 Other long term (current) drug therapy: Secondary | ICD-10-CM | POA: Insufficient documentation

## 2024-04-23 DIAGNOSIS — M25551 Pain in right hip: Secondary | ICD-10-CM | POA: Insufficient documentation

## 2024-04-23 DIAGNOSIS — G894 Chronic pain syndrome: Secondary | ICD-10-CM | POA: Insufficient documentation

## 2024-04-23 DIAGNOSIS — M47816 Spondylosis without myelopathy or radiculopathy, lumbar region: Secondary | ICD-10-CM | POA: Insufficient documentation

## 2024-04-23 DIAGNOSIS — M25562 Pain in left knee: Secondary | ICD-10-CM | POA: Diagnosis present

## 2024-04-23 MED ORDER — HYDROCODONE-ACETAMINOPHEN 5-325 MG PO TABS: 1.0000 | ORAL_TABLET | Freq: Three times a day (TID) | ORAL | 0 refills | Status: DC | PRN

## 2024-04-23 NOTE — Progress Notes (Signed)
 Nursing Pain Medication Assessment:  Safety precautions to be maintained throughout the outpatient stay will include: orient to surroundings, keep bed in low position, maintain call bell within reach at all times, provide assistance with transfer out of bed and ambulation.  Medication Inspection Compliance: Pill count conducted under aseptic conditions, in front of the patient. Neither the pills nor the bottle was removed from the patient's sight at any time. Once count was completed pills were immediately returned to the patient in their original bottle.  Medication: Hydrocodone /APAP Pill/Patch Count: 14 of 90 pills/patches remain Pill/Patch Appearance: Markings consistent with prescribed medication Bottle Appearance: Standard pharmacy container. Clearly labeled. Filled Date: 06 / 10 / 2025 Last Medication intake:  Yesterday

## 2024-04-23 NOTE — Progress Notes (Signed)
 PROVIDER NOTE: Interpretation of information contained herein should be left to medically-trained personnel. Specific patient instructions are provided elsewhere under Patient Instructions section of medical record. This document was created in part using AI and STT-dictation technology, any transcriptional errors that may result from this process are unintentional.  Patient: Theresa [name]  Service: E/M   PCP: Odell Chard, Edra GRADE, MD  DOB: 1956/05/14  DOS: 04/23/2024  Provider: Emmy MARLA Blanch, NP  MRN: [id number]  Delivery: Face-to-face  Specialty: Interventional Pain Management  Type: Established Patient  Setting: Ambulatory outpatient facility  Specialty designation: 09  Referring Prov.: Odell Chard, Consuelo *  Location: Outpatient office facility       History of present illness (HPI) [name], a 68 y.o. year old female, is here today because of her No primary diagnosis found.. Theresa [name] primary complain today is Shoulder Pain (Right Shoulder , Lower back )  Pertinent problems: Theresa [name] has Neuritis or radiculitis due to rupture of lumbar intervertebral disc; DDD (degenerative disc disease), lumbar; Carpal tunnel syndrome; Neurogenic pain; Neuropathic pain; Musculoskeletal pain; Lumbar spondylosis (L4-L5); Chronic low back pain (1ry area of pain) (Bilateral) (R>L) w/o sciatica; Chronic lower extremity pain (2ry area of pain) (Left); and Chronic pain syndrome on their pertinent problem list.   Pain Assessment: Severity of Chronic pain is reported as a 7  (Back pain 8, shoulder pain 7)/10. Location: Shoulder Right/Shoulder pain radiates down right arm, back pain radiates down right leg. Onset: More than a month ago. Quality: Burning, Aching, Constant. Timing: Constant. Modifying factor(s): Nothing. Vitals:  height is 5' 2 (1.575 m) and weight is 206 lb (93.4 kg). Her temperature is 98.3 F (36.8 C). Her blood pressure is 123/62 and her pulse is 66. Her oxygen  saturation is  95%.  BMI: Estimated body mass index is 37.68 kg/m as calculated from the following:   Height as of this encounter: 5' 2 (1.575 m).   Weight as of this encounter: 206 lb (93.4 kg).  Last encounter: 04/10/2024 Last procedure: 04/16/2024  Reason for encounter: medication management.  The patient indicates doing well with current medication regimen.  No side effects or adverse reaction reported to medication.   The patient experiences right shoulder pain radiating to to the upper arm, extending just below the elbow, which limits her ability to lift her right arm.  Pharmacotherapy Assessment   Hydrocodone -acetaminophen  (Norco/Vicodin) 5-325 mg tablet every 8 hours as needed for pain. MME=15 Monitoring: [place] PMP: PDMP reviewed during this encounter.       Pharmacotherapy: No side-effects or adverse reactions reported. Compliance: No problems identified. Effectiveness: Clinically acceptable.  Erlene Doyal [name], [place]  04/23/2024 11:50 AM  Sign when Signing Visit Nursing Pain Medication Assessment:  Safety precautions to be maintained throughout the outpatient stay will include: orient to surroundings, keep bed in low position, maintain call bell within reach at all times, provide assistance with transfer out of bed and ambulation.  Medication Inspection Compliance: Pill count conducted under aseptic conditions, in front of the patient. Neither the pills nor the bottle was removed from the patient's sight at any time. Once count was completed pills were immediately returned to the patient in their original bottle.  Medication: Hydrocodone /APAP Pill/Patch Count: 14 of 90 pills/patches remain Pill/Patch Appearance: Markings consistent with prescribed medication Bottle Appearance: Standard pharmacy container. Clearly labeled. Filled Date: 06 / 10 / 2025 Last Medication intake:  Yesterday    UDS:  Summary  Date Value Ref Range Status  10/25/2023  FINAL  Final    Comment:     ==================================================================== [employer] Select 13 (MW) ==================================================================== Test                             Result       Flag       Units  Drug Present and Declared for Prescription Verification   Hydrocodone                     1227         EXPECTED   ng/mg creat   Hydromorphone                   103          EXPECTED   ng/mg creat   Dihydrocodeine                 146          EXPECTED   ng/mg creat   Norhydrocodone                 518          EXPECTED   ng/mg creat    Sources of hydrocodone  include scheduled prescription medications.    Hydromorphone , dihydrocodeine and norhydrocodone are expected    metabolites of hydrocodone . Hydromorphone  and dihydrocodeine are    also available as scheduled prescription medications.  ==================================================================== Test                      Result    Flag   Units      Ref Range   Creatinine              67               mg/dL      >=79 ==================================================================== Declared Medications:  The flagging and interpretation on this report are based on the  following declared medications.  Unexpected results may arise from  inaccuracies in the declared medications.   **Note: The testing scope of this panel includes these medications:   Hydrocodone  (Norco)   **Note: The testing scope of this panel does not include the  following reported medications:   Acetaminophen  (Tylenol )  Acetaminophen  (Norco)  Albuterol  (Ventolin  HFA)  Albuterol  (Duoneb)  Amiodarone  (Pacerone )  Buspirone  (Buspar )  Cholecalciferol   Dapagliflozin  (Farxiga )  Diltiazem  (Cardizem )  Fluticasone   Furosemide   Ipratropium (Duoneb)  Melatonin  Naloxone  (Narcan )  Omeprazole  (Prilosec)  Polyethylene Glycol (MiraLAX )  Potassium (Klor-Con )  Roflumilast  (Daliresp )  Semaglutide  (Wegovy )  Sertraline  (Zoloft )   Spironolactone  (Aldactone )  Topical Diclofenac  (Voltaren )  Torsemide  (Demadex )  Umeclidinium  Vilanterol ==================================================================== For clinical consultation, please call [phone]. ====================================================================     No results found for: CBDTHCR No results found for: D8THCCBX No results found for: D9THCCBX  ROS  Constitutional: Denies any fever or chills Gastrointestinal: No reported hemesis, hematochezia, vomiting, or acute GI distress Musculoskeletal: Right shoulder pain, lower back pain Neurological: No reported episodes of acute onset apraxia, aphasia, dysarthria, agnosia, amnesia, paralysis, loss of coordination, or loss of consciousness  Medication Review  Cholecalciferol , Fluticasone -Umeclidin-Vilant, Furosemide , HYDROcodone -acetaminophen , Oxygen -Helium, Torsemide , acetaminophen , albuterol , amiodarone , amoxicillin , busPIRone , dapagliflozin  propanediol, diltiazem , ipratropium-albuterol , naloxone , omeprazole , potassium chloride  SA, roflumilast , sertraline , spironolactone , and tirzepatide   History Review  Allergy: Ms. Vanderslice is allergic to gabapentin , codeine, and meloxicam . Drug: Ms. Dedmon  reports current drug use. Alcohol:  reports no history of alcohol use. Tobacco:  reports that she has  quit smoking. Her smoking use included cigarettes. She has a 66 pack-year smoking history. She has never used smokeless tobacco. Social: Ms. Plummer  reports that she has quit smoking. Her smoking use included cigarettes. She has a 66 pack-year smoking history. She has never used smokeless tobacco. She reports current drug use. She reports that she does not drink alcohol. Medical:  has a past medical history of Acute postoperative pain (10/03/2017), Anemia, Anxiety, BiPAP (biphasic positive airway pressure) dependence, Carpal tunnel syndrome, CHF (congestive heart failure) (HCC), CHF (congestive heart  failure) (HCC), Chronic generalized abdominal pain, Chronic rhinitis, COPD (chronic obstructive pulmonary disease) (HCC), DDD (degenerative disc disease), cervical, DDD (degenerative disc disease), lumbosacral, Depression, Dyspnea, Edema, Flu, Gastritis, GERD (gastroesophageal reflux disease), Hematuria, Hernia of abdominal wall (07/17/2018), Hernia, abdominal, Hypertension, Kidney stones, Low back pain, Lumbar radiculitis, Malodorous urine, Muscle weakness, Obesity, Oxygen  dependent, Pneumonia due to COVID-19 virus (02/03/2020), Renal cyst, Sensory urge incontinence, Thyroid  activity decreased, Tobacco abuse, and Wheezing. Surgical: Ms. Sandusky  has a past surgical history that includes Abdominal hysterectomy; Tonsillectomy; Carpal tunnel release (Left, 2012); Tubal ligation; epigastric hernia repair (N/A, 08/13/2018); Umbilical hernia repair (N/A, 08/13/2018); RIGHT/LEFT HEART CATH AND CORONARY ANGIOGRAPHY (N/A, 07/11/2019); TEE without cardioversion (N/A, 04/29/2022); Cardioversion (N/A, 04/29/2022); Esophagogastroduodenoscopy (N/A, 06/21/2022); cataract surgery (Bilateral); Cardioversion (N/A, 01/30/2024); Cardioversion (N/A, 02/19/2024); Flexible sigmoidoscopy (N/A, 03/05/2024); Esophagogastroduodenoscopy (N/A, 03/05/2024); Colonoscopy (N/A, 03/05/2024); and Polypectomy (03/05/2024). Family: family history includes Alcohol abuse in her father; Breast cancer in her sister; Cancer in her brother, brother, and sister; Coronary artery disease in her mother; Heart disease in her father and mother; Kidney cancer in her brother; Lung cancer in her sister; Pneumonia in her brother; Stroke in her mother.  Laboratory Chemistry Profile   Renal Lab Results  Component Value Date   BUN 9 03/07/2024   CREATININE 0.74 03/07/2024   BCR 20 06/05/2023   GFRAA >60 06/26/2020   GFRNONAA >60 03/07/2024    Hepatic Lab Results  Component Value Date   AST 30 03/02/2024   ALT 34 03/02/2024   ALBUMIN 3.7 03/02/2024   ALKPHOS  91 03/02/2024   LIPASE 57 (H) 06/19/2022   AMMONIA 40 (H) 10/11/2019    Electrolytes Lab Results  Component Value Date   NA 141 03/07/2024   K 3.1 (L) 03/07/2024   CL 108 03/07/2024   CALCIUM  8.8 (L) 03/07/2024   MG 2.8 (H) 03/02/2024   PHOS 3.2 06/21/2022    Bone Lab Results  Component Value Date   25OHVITD1 13 (L) 09/17/2015   25OHVITD2 <1.0 09/17/2015   25OHVITD3 13 09/17/2015    Inflammation (CRP: Acute Phase) (ESR: Chronic Phase) Lab Results  Component Value Date   CRP 0.9 04/02/2022   ESRSEDRATE 17 04/06/2022   LATICACIDVEN 0.9 04/01/2022         Note: Above Lab results reviewed.  Recent Imaging Review  DG PAIN CLINIC C-ARM 1-60 MIN NO REPORT Fluoro was used, but no Radiologist interpretation will be provided.  Please refer to NOTES tab for provider progress note. Note: Reviewed        Physical Exam  Vitals: BP 123/62   Pulse 66   Temp 98.3 F (36.8 C)   Ht 5' 2 (1.575 m)   Wt 206 lb (93.4 kg)   SpO2 95% Comment: on 5L of O2  BMI 37.68 kg/m  BMI: Estimated body mass index is 37.68 kg/m as calculated from the following:   Height as of this encounter: 5' 2 (1.575 m).   Weight as  of this encounter: 206 lb (93.4 kg). Ideal: Ideal body weight: 50.1 kg (110 lb 7.2 oz) Adjusted ideal body weight: 67.4 kg (148 lb 10.7 oz) General appearance: Well nourished, well developed, and well hydrated. In no apparent acute distress Mental status: Alert, oriented x 3 (person, place, & time)       Respiratory: No evidence of acute respiratory distress Eyes: PERLA   Assessment   Diagnosis Status  1. Chronic low back pain (1ry area of Pain) (Bilateral) (L>R) w/o sciatica   2. Chronic lower extremity pain (2ry area of Pain) (Left)   3. Chronic hip pain (3ry area of Pain) (Bilateral) (L>R)   4. Lumbar facet syndrome (Bilateral) (L>R)   5. Chronic knee pain (Left)   6. Chronic lower extremity pain (Bilateral) (L>R)   7. Chronic pain syndrome (significant  psychosocial component)   8. Pharmacologic therapy   9. Chronic use of opiate for therapeutic purpose   10. Encounter for medication management   11. Encounter for chronic pain management    Controlled Controlled Controlled   Updated Problems: No problems updated.  Plan of Care  Problem-specific:  Assessment and Plan We will continue on current medication regimen.  Prescribing drug monitoring (PDMP) reviewed; findings consistent with the use of prescribed medication and no evidence of narcotic misuse or abuse.  Urine drug screening (UDS) up-to-date.  No other new issues or problems reported to this visit.  Schedule follow-up in 90 days for medication management with Emmy Blanch, NP.   Ms. MAGDALINA WHITEHEAD has a current medication list which includes the following long-term medication(s): albuterol , amiodarone , ipratropium-albuterol , potassium chloride  sa, sertraline , spironolactone , torsemide , [START ON 04/25/2024] hydrocodone -acetaminophen , [START ON 05/25/2024] hydrocodone -acetaminophen , and [START ON 06/24/2024] hydrocodone -acetaminophen .  Pharmacotherapy (Medications Ordered): Meds ordered this encounter  Medications   HYDROcodone -acetaminophen  (NORCO/VICODIN) 5-325 MG tablet    Sig: Take 1 tablet by mouth every 8 (eight) hours as needed for severe pain (pain score 7-10). Must last 30 days    Dispense:  90 tablet    Refill:  0    DO NOT: delete (not duplicate); no partial-fill (will deny script to complete), no refill request (F/U required). DISPENSE: 1 day early if closed on fill date. WARN: No CNS-depressants within 8 hrs of med.   HYDROcodone -acetaminophen  (NORCO/VICODIN) 5-325 MG tablet    Sig: Take 1 tablet by mouth every 8 (eight) hours as needed for severe pain (pain score 7-10). Must last 30 days    Dispense:  90 tablet    Refill:  0    DO NOT: delete (not duplicate); no partial-fill (will deny script to complete), no refill request (F/U required). DISPENSE: 1 day early if  closed on fill date. WARN: No CNS-depressants within 8 hrs of med.   HYDROcodone -acetaminophen  (NORCO/VICODIN) 5-325 MG tablet    Sig: Take 1 tablet by mouth every 8 (eight) hours as needed for severe pain (pain score 7-10). Must last 30 days    Dispense:  90 tablet    Refill:  0    DO NOT: delete (not duplicate); no partial-fill (will deny script to complete), no refill request (F/U required). DISPENSE: 1 day early if closed on fill date. WARN: No CNS-depressants within 8 hrs of med.   Orders:  No orders of the defined types were placed in this encounter.       Return in about 3 months (around 07/24/2024) for (F2F), (MM), Emmy Blanch NP.    Recent Visits Date Type Provider Dept  04/16/24 Procedure visit  Tanya Glisson, MD [facility]  04/10/24 Office Visit Tanya Glisson, MD [facility]  01/24/24 Office Visit Tanya Glisson, MD [facility]  Showing recent visits within past 90 days and meeting all other requirements Today's Visits Date Type Provider Dept  04/23/24 Office Visit Celisse Ciulla K, NP [facility]  Showing today's visits and meeting all other requirements Future Appointments Date Type Provider Dept  05/02/24 Appointment Tanya Glisson, MD [facility]  07/22/24 Appointment Tramayne Sebesta K, NP [facility]  Showing future appointments within next 90 days and meeting all other requirements  I discussed the assessment and treatment plan with the patient. The patient was provided an opportunity to ask questions and all were answered. The patient agreed with the plan and demonstrated an understanding of the instructions.  Patient advised to call back or seek an in-person evaluation if the symptoms or condition worsens.  Duration of encounter: 30 minutes.  Total time on encounter, as per AMA guidelines included both the face-to-face and non-face-to-face time personally spent by the physician and/or other  qualified health care professional(s) on the day of the encounter (includes time in activities that require the physician or other qualified health care professional and does not include time in activities normally performed by clinical staff). Physician's time may include the following activities when performed: Preparing to see the patient (e.g., pre-charting review of records, searching for previously ordered imaging, lab work, and nerve conduction tests) Review of prior analgesic pharmacotherapies. Reviewing PMP Interpreting ordered tests (e.g., lab work, imaging, nerve conduction tests) Performing post-procedure evaluations, including interpretation of diagnostic procedures Obtaining and/or reviewing separately obtained history Performing a medically appropriate examination and/or evaluation Counseling and educating the patient/family/caregiver Ordering medications, tests, or procedures Referring and communicating with other health care professionals (when not separately reported) Documenting clinical information in the electronic or other health record Independently interpreting results (not separately reported) and communicating results to the patient/ family/caregiver Care coordination (not separately reported)  Note by: Rollins Wrightson K Jaymien Landin, NP (TTS and AI technology used. I apologize for any typographical errors that were not detected and corrected.) Date: 04/23/2024; Time: 12:21 PM

## 2024-04-25 ENCOUNTER — Other Ambulatory Visit: Payer: Self-pay | Admitting: Pain Medicine

## 2024-04-25 DIAGNOSIS — Z79891 Long term (current) use of opiate analgesic: Secondary | ICD-10-CM

## 2024-04-25 DIAGNOSIS — G8929 Other chronic pain: Secondary | ICD-10-CM

## 2024-04-25 DIAGNOSIS — G894 Chronic pain syndrome: Secondary | ICD-10-CM

## 2024-04-25 DIAGNOSIS — M47816 Spondylosis without myelopathy or radiculopathy, lumbar region: Secondary | ICD-10-CM

## 2024-04-25 DIAGNOSIS — M545 Low back pain, unspecified: Secondary | ICD-10-CM

## 2024-04-25 DIAGNOSIS — Z79899 Other long term (current) drug therapy: Secondary | ICD-10-CM

## 2024-05-01 ENCOUNTER — Other Ambulatory Visit: Payer: Self-pay

## 2024-05-01 ENCOUNTER — Ambulatory Visit
Admission: RE | Admit: 2024-05-01 | Discharge: 2024-05-01 | Disposition: A | Discharge status: Home / Self Care | Attending: Internal Medicine | Admitting: Internal Medicine

## 2024-05-01 ENCOUNTER — Encounter: Payer: Self-pay | Admitting: Internal Medicine

## 2024-05-01 ENCOUNTER — Encounter
Admission: RE | Disposition: A | Discharge status: Home / Self Care | Payer: Self-pay | Source: Home / Self Care | Attending: Internal Medicine

## 2024-05-01 DIAGNOSIS — I2510 Atherosclerotic heart disease of native coronary artery without angina pectoris: Secondary | ICD-10-CM | POA: Insufficient documentation

## 2024-05-01 DIAGNOSIS — I700 Atherosclerosis of aorta: Secondary | ICD-10-CM | POA: Diagnosis not present

## 2024-05-01 DIAGNOSIS — K219 Gastro-esophageal reflux disease without esophagitis: Secondary | ICD-10-CM | POA: Insufficient documentation

## 2024-05-01 DIAGNOSIS — Z9181 History of falling: Secondary | ICD-10-CM | POA: Diagnosis not present

## 2024-05-01 DIAGNOSIS — Z87891 Personal history of nicotine dependence: Secondary | ICD-10-CM | POA: Insufficient documentation

## 2024-05-01 DIAGNOSIS — I272 Pulmonary hypertension, unspecified: Secondary | ICD-10-CM | POA: Diagnosis present

## 2024-05-01 DIAGNOSIS — I4819 Other persistent atrial fibrillation: Secondary | ICD-10-CM | POA: Insufficient documentation

## 2024-05-01 DIAGNOSIS — J9611 Chronic respiratory failure with hypoxia: Secondary | ICD-10-CM | POA: Diagnosis not present

## 2024-05-01 HISTORY — PX: RIGHT HEART CATH: CATH118263

## 2024-05-01 LAB — POCT I-STAT EG7
Acid-Base Excess: 2 mmol/L (ref 0.0–2.0)
Bicarbonate: 28.8 mmol/L — ABNORMAL HIGH (ref 20.0–28.0)
Calcium, Ion: 1.27 mmol/L (ref 1.15–1.40)
HCT: 34 % — ABNORMAL LOW (ref 36.0–46.0)
Hemoglobin: 11.6 g/dL — ABNORMAL LOW (ref 12.0–15.0)
O2 Saturation: 67 %
Potassium: 3.6 mmol/L (ref 3.5–5.1)
Sodium: 139 mmol/L (ref 135–145)
TCO2: 30 mmol/L (ref 22–32)
pCO2, Ven: 54.3 mmHg (ref 44–60)
pH, Ven: 7.333 (ref 7.25–7.43)
pO2, Ven: 38 mmHg (ref 32–45)

## 2024-05-01 SURGERY — RIGHT HEART CATH
Anesthesia: Moderate Sedation | Laterality: Right

## 2024-05-01 MED ORDER — SODIUM CHLORIDE 0.9 % WEIGHT BASED INFUSION: 1.0000 mL/kg/h | INTRAVENOUS | Status: DC

## 2024-05-01 MED ORDER — SODIUM CHLORIDE 0.9 % WEIGHT BASED INFUSION
1.0000 mL/kg/h | INTRAVENOUS | Status: DC
  Administered 2024-05-01: 0.107 mL/kg/h via INTRAVENOUS

## 2024-05-01 MED ORDER — ACETAMINOPHEN 325 MG PO TABS: 650.0000 mg | ORAL_TABLET | ORAL | Status: DC | PRN

## 2024-05-01 MED ORDER — SODIUM CHLORIDE 0.9% FLUSH: 3.0000 mL | INTRAVENOUS | Status: DC | PRN

## 2024-05-01 MED ORDER — ONDANSETRON HCL 4 MG/2ML IJ SOLN: 4.0000 mg | Freq: Four times a day (QID) | INTRAMUSCULAR | Status: DC | PRN

## 2024-05-01 MED ORDER — HYDRALAZINE HCL 20 MG/ML IJ SOLN: 10.0000 mg | INTRAMUSCULAR | Status: DC | PRN

## 2024-05-01 MED ORDER — SODIUM CHLORIDE 0.9% FLUSH: 3.0000 mL | Freq: Two times a day (BID) | INTRAVENOUS | Status: DC

## 2024-05-01 MED ORDER — SODIUM CHLORIDE 0.9 % WEIGHT BASED INFUSION: 3.0000 mL/kg/h | INTRAVENOUS | Status: AC

## 2024-05-01 MED ORDER — SODIUM CHLORIDE 0.9 % IV SOLN: 250.0000 mL | INTRAVENOUS | Status: DC | PRN

## 2024-05-01 MED ORDER — LIDOCAINE HCL (PF) 1 % IJ SOLN
INTRAMUSCULAR | Status: AC
  Filled 2024-05-01: qty 30

## 2024-05-01 SURGICAL SUPPLY — 6 items
CATH BALLN WEDGE 5F 110CM (CATHETERS) IMPLANT
DRAPE BRACHIAL (DRAPES) IMPLANT
GUIDEWIRE .025 260CM (WIRE) IMPLANT
PACK CARDIAC CATH (CUSTOM PROCEDURE TRAY) ×1 IMPLANT
SET ATX-X65L (MISCELLANEOUS) IMPLANT
SHEATH GLIDE SLENDER 4/5FR (SHEATH) IMPLANT

## 2024-05-01 NOTE — Discharge Instructions (Signed)
Right Heart Cath, Care After This sheet gives you information about how to care for yourself after your procedure. Your health care provider may also give you more specific instructions. If you have problems or questions, contact your health care provider. What can I expect after the procedure? After the procedure, it is common to have: Bruising or mild discomfort in the area where the IV was inserted (insertion site). Follow these instructions at home: Eating and drinking  You may eat and drink after your procedure.  Drink a lot of fluids for the first several days after the procedure, as directed by your health care provider. This helps to wash (flush) the contrast out of your body. Examples of healthy fluids include water or low-calorie drinks. General instructions Check your IV insertion area and also your venous access site every day for signs of infection. Check for: Redness, swelling, or pain. Fluid or blood. Warmth. Pus or a bad smell. Take over-the-counter and prescription medicines only as told by your health care provider. Rest and return to your normal activities as told by your health care provider. Ask your health care provider what activities are safe for you. Do not drive for 24 hours if you were given a medicine to help you relax (sedative), or until your health care provider approves. Keep all follow-up visits as told by your health care provider. This is important. Contact a health care provider if: Your skin becomes itchy or you develop a rash or hives. You have a fever that does not get better with medicine. You feel nauseous. You vomit. You have redness, swelling, or pain around the insertion site. You have fluid or blood coming from the insertion site. Your insertion area feels warm to the touch. You have pus or a bad smell coming from the insertion site. Get help right away if: You have difficulty breathing or shortness of breath. You develop chest pain. You  faint. You feel very dizzy. These symptoms may represent a serious problem that is an emergency. Do not wait to see if the symptoms will go away. Get medical help right away. Call your local emergency services (911 in the [place] Do not drive yourself to the hospital. Summary After your procedure, it is common to have bruising or mild discomfort in the area where the IV was inserted. You should check your IV insertion area every day for signs of infection. Take over-the-counter and prescription medicines only as told by your health care provider. You should drink a lot of fluids for the first several days after the procedure to help flush the contrast from your body. This information is not intended to replace advice given to you by your health care provider. Make sure you discuss any questions you have with your health care provider. Document Released: 07/24/2013 Document Revised: 09/15/2017 Document Reviewed: 08/27/2016 Elsevier Patient Education  2020 [employer] 

## 2024-05-02 ENCOUNTER — Ambulatory Visit: Admitting: Pain Medicine

## 2024-05-03 ENCOUNTER — Encounter: Payer: Self-pay | Admitting: Internal Medicine

## 2024-05-07 ENCOUNTER — Inpatient Hospital Stay: Attending: Oncology

## 2024-05-07 DIAGNOSIS — D500 Iron deficiency anemia secondary to blood loss (chronic): Secondary | ICD-10-CM | POA: Diagnosis present

## 2024-05-07 LAB — CBC WITH DIFFERENTIAL (CANCER CENTER ONLY)
Abs Immature Granulocytes: 0.02 K/uL (ref 0.00–0.07)
Basophils Absolute: 0 K/uL (ref 0.0–0.1)
Basophils Relative: 1 %
Eosinophils Absolute: 0.1 K/uL (ref 0.0–0.5)
Eosinophils Relative: 1 %
HCT: 40 % (ref 36.0–46.0)
Hemoglobin: 12 g/dL (ref 12.0–15.0)
Immature Granulocytes: 0 %
Lymphocytes Relative: 9 %
Lymphs Abs: 0.7 K/uL (ref 0.7–4.0)
MCH: 27.7 pg (ref 26.0–34.0)
MCHC: 30 g/dL (ref 30.0–36.0)
MCV: 92.4 fL (ref 80.0–100.0)
Monocytes Absolute: 0.4 K/uL (ref 0.1–1.0)
Monocytes Relative: 6 %
Neutro Abs: 6.3 K/uL (ref 1.7–7.7)
Neutrophils Relative %: 83 %
Platelet Count: 219 K/uL (ref 150–400)
RBC: 4.33 MIL/uL (ref 3.87–5.11)
RDW: 19.4 % — ABNORMAL HIGH (ref 11.5–15.5)
WBC Count: 7.5 K/uL (ref 4.0–10.5)
nRBC: 0 % (ref 0.0–0.2)

## 2024-05-07 LAB — IRON AND TIBC
Iron: 53 ug/dL (ref 28–170)
Saturation Ratios: 12 % (ref 10.4–31.8)
TIBC: 437 ug/dL (ref 250–450)
UIBC: 384 ug/dL

## 2024-05-07 LAB — FERRITIN: Ferritin: 50 ng/mL (ref 11–307)

## 2024-05-07 LAB — RETIC PANEL
Immature Retic Fract: 24 % — ABNORMAL HIGH (ref 2.3–15.9)
RBC.: 4.3 MIL/uL (ref 3.87–5.11)
Retic Count, Absolute: 82.1 K/uL (ref 19.0–186.0)
Retic Ct Pct: 1.9 % (ref 0.4–3.1)
Reticulocyte Hemoglobin: 27.9 pg — ABNORMAL LOW (ref >27.9)

## 2024-05-08 ENCOUNTER — Ambulatory Visit: Admitting: Pain Medicine

## 2024-05-09 ENCOUNTER — Inpatient Hospital Stay

## 2024-05-09 ENCOUNTER — Inpatient Hospital Stay (HOSPITAL_BASED_OUTPATIENT_CLINIC_OR_DEPARTMENT_OTHER): Admitting: Oncology

## 2024-05-09 ENCOUNTER — Encounter: Payer: Self-pay | Admitting: Oncology

## 2024-05-09 VITALS — BP 118/72 | HR 69 | Resp 18

## 2024-05-09 VITALS — BP 124/66 | HR 72 | Temp 98.4°F | Resp 18 | Wt 208.1 lb

## 2024-05-09 DIAGNOSIS — D500 Iron deficiency anemia secondary to blood loss (chronic): Secondary | ICD-10-CM | POA: Diagnosis not present

## 2024-05-09 MED ORDER — IRON SUCROSE 20 MG/ML IV SOLN
200.0000 mg | Freq: Once | INTRAVENOUS | Status: AC
  Administered 2024-05-09: 200 mg via INTRAVENOUS
  Filled 2024-05-09: qty 10

## 2024-05-09 NOTE — Patient Instructions (Signed)

## 2024-05-09 NOTE — Progress Notes (Signed)
 Hematology/Oncology Progress note [phone] [phone]     CHIEF COMPLAINTS/REASON FOR VISIT:  Iron  deficiency anemia  ASSESSMENT & PLAN:   Iron  deficiency anemia due to chronic blood loss Lab Results  Component Value Date   HGB 12.0 05/07/2024   TIBC 437 05/07/2024   IRONPCTSAT 12 05/07/2024   FERRITIN 50 05/07/2024    Recommend weekly Venofer  x 1 for further crease iron  store.   Follow-up in 4 months. All questions were answered. The patient knows to call the clinic with any problems, questions or concerns.  Zelphia Cap, MD, PhD [facility] Hematology Oncology 05/09/2024     HISTORY OF PRESENTING ILLNESS:  Theresa [name] is a  68 y.o.  female with PMH listed below who was referred to me for anemia Reviewed patient's recent labs that was done.  She was found to have anemia since September 2023,  acute blood loss anemia Hb 7.4. She was on Xarelto  for A Fib and Aspirin  at that time, anticoagulation was held. She was transfused with PRBCs She underwent EGD, fount to have gastritis, non bleeding duodenal ulcers. Recently she follows up with Dr.Toledo and had repeat blood work on 09/19/22, which still showed persistent anemia with hemoglobin of 7.1, 09/27/2022 Ferritin 19, TIBC 578,  She has chronic respiratory failure due to pulmonary hypertension, on nasal cannula oxygen , follows ups with pulmonology.   She was off Xarelto  for about a year and resumed 2-3 months ago. Patient was recently hospitalized due to acute anemia due to GI bleeding.  Xarelto  was held. 03/05/2024, status post colonoscopy with polypectomy.  She had moderate bleeding afterwards and had a flex sigmoidoscopy with cautery and clip placement. Patient was transfused with PRBC and Venofer  treatments.  Hemoglobin improved to 8.3 and was discharged home.  INTERVAL HISTORY Theresa [name] is a 68 y.o. female who has above history reviewed by me today presents for follow up visit for iron   deficiency anemia.  Patient tolerated IV Venofer  treatments.  Fatigue is slightly better.  She is currently off Xarelto  due to bleeding tendency.  She will see expert for evaluation of Watchman   MEDICAL HISTORY:  Past Medical History:  Diagnosis Date   Acute postoperative pain 10/03/2017   Anemia    Anxiety    BiPAP (biphasic positive airway pressure) dependence    at hs   Carpal tunnel syndrome    CHF (congestive heart failure) (HCC)    1/18   CHF (congestive heart failure) (HCC)    Chronic generalized abdominal pain    Chronic rhinitis    COPD (chronic obstructive pulmonary disease) (HCC)    DDD (degenerative disc disease), cervical    DDD (degenerative disc disease), lumbosacral    Depression    Dyspnea    Edema    Flu    1/18   Gastritis    GERD (gastroesophageal reflux disease)    Hematuria    Hernia of abdominal wall 07/17/2018   Hernia, abdominal    Hypertension    Kidney stones    Low back pain    Lumbar radiculitis    Malodorous urine    Muscle weakness    Obesity    Oxygen  dependent    Pneumonia due to COVID-19 virus 02/03/2020   Renal cyst    Sensory urge incontinence    Thyroid  activity decreased    9/19   Tobacco abuse    Wheezing     SURGICAL HISTORY: Past Surgical History:  Procedure Laterality Date  ABDOMINAL HYSTERECTOMY     CARDIOVERSION N/A 04/29/2022   Procedure: CARDIOVERSION;  Surgeon: Hester Wolm PARAS, MD;  Location: [facility] OR[facility];  Service: Cardiovascular;  Laterality: N/A;   CARDIOVERSION N/A 01/30/2024   Procedure: CARDIOVERSION;  Surgeon: Florencio Cara BIRCH, MD;  Location: [facility];  Service: Cardiovascular;  Laterality: N/A;   CARDIOVERSION N/A 02/19/2024   Procedure: CARDIOVERSION;  Surgeon: Florencio Cara BIRCH, MD;  Location: [facility];  Service: Cardiovascular;  Laterality: N/A;   CARPAL TUNNEL RELEASE Left 2012   cataract surgery Bilateral    COLONOSCOPY N/A 03/05/2024   Procedure: COLONOSCOPY;  Surgeon: Jinny Carmine, MD;   Location: [facility] ENDOS[facility];  Service: Endoscopy;  Laterality: N/A;   EPIGASTRIC HERNIA REPAIR N/A 08/13/2018   HERNIA REPAIR EPIGASTRIC ADULT; polypropylene mesh reinforcement.  Surgeon: Dessa Reyes ORN, MD;  Location: [facility];  Service: General;  Laterality: N/A;   ESOPHAGOGASTRODUODENOSCOPY N/A 06/21/2022   Procedure: ESOPHAGOGASTRODUODENOSCOPY (EGD);  Surgeon: Toledo, Ladell POUR, MD;  Location: [facility] ENDOS[facility];  Service: Gastroenterology;  Laterality: N/A;   ESOPHAGOGASTRODUODENOSCOPY N/A 03/05/2024   Procedure: EGD (ESOPHAGOGASTRODUODENOSCOPY);  Surgeon: Jinny Carmine, MD;  Location: [facility] ENDOSCOPY;  Service: Endoscopy;  Laterality: N/A;   FLEXIBLE SIGMOIDOSCOPY N/A 03/05/2024   Procedure: KINGSTON SIDE;  Surgeon: Jinny Carmine, MD;  Location: [facility] ENDOSCOPY;  Service: Endoscopy;  Laterality: N/A;   POLYPECTOMY  03/05/2024   Procedure: POLYPECTOMY, INTESTINE;  Surgeon: Jinny Carmine, MD;  Location: [facility] ENDOSCOPY;  Service: Endoscopy;;   RIGHT HEART CATH Right 05/01/2024   Procedure: RIGHT HEART CATH;  Surgeon: Florencio Cara BIRCH, MD;  Location: [facility];  Service: Cardiovascular;  Laterality: Right;   RIGHT/LEFT HEART CATH AND CORONARY ANGIOGRAPHY N/A 07/11/2019   Procedure: RIGHT/LEFT HEART CATH AND CORONARY ANGIOGRAPHY;  Surgeon: Florencio Cara BIRCH, MD;  Location: [facility];  Service: Cardiovascular;  Laterality: N/A;   TEE WITHOUT CARDIOVERSION N/A 04/29/2022   Procedure: TRANSESOPHAGEAL ECHOCARDIOGRAM (TEE);  Surgeon: Hester Wolm PARAS, MD;  Location: [facility];  Service: Cardiovascular;  Laterality: N/A;   TONSILLECTOMY     TUBAL LIGATION     UMBILICAL HERNIA REPAIR N/A 08/13/2018   HERNIA REPAIR UMBILICAL ADULT; polypropylene mesh reinforcement Surgeon: Dessa Reyes ORN, MD;  Location: [facility];  Service: General;  Laterality: N/A;    SOCIAL HISTORY: Social History   Socioeconomic History   Marital status: Married    Spouse name: Rankin   Number of  children: 2   Years of education: Not on file   Highest education level: 12th grade  Occupational History   Occupation: disability  Tobacco Use   Smoking status: Former    Current packs/day: 1.50    Average packs/day: 1.5 packs/day for 44.0 years (66.0 ttl pk-yrs)    Types: Cigarettes   Smokeless tobacco: Never   Tobacco comments:    Stopped 4 weeks ago May 20, 2023  Vaping Use   Vaping status: Never Used  Substance and Sexual Activity   Alcohol use: No   Drug use: Never    Comment: prescribed hydrocodone    Sexual activity: Not Currently    Birth control/protection: Surgical  Other Topics Concern   Not on file  Social History Narrative   ** Merged History Encounter **       Lives with spouse    Social Drivers of Health   Financial Resource Strain: Medium Risk (02/26/2024)   Received from [facility]   Overall Financial Resource Strain (CARDIA)    Difficulty of Paying Living Expenses: Somewhat hard  Food Insecurity: No Food Insecurity (03/02/2024)   Hunger Vital Sign    Worried About Running Out of Food in the Last Year: Never true    Ran Out of Food in the Last Year: Never true  Recent Concern: Food Insecurity - Food Insecurity Present (02/26/2024)   Received from [facility]   Hunger Vital Sign    Within the past 12 months, you worried that your food would run out before you got the money to buy more.: Sometimes true    Within the past 12 months, the food you bought just didn't last and you didn't have money to get more.: Often true  Transportation Needs: No Transportation Needs (03/02/2024)   PRAPARE - Administrator, Civil Service (Medical): No    Lack of Transportation (Non-Medical): No  Physical Activity: Inactive (08/24/2022)   Received from [facility]   Exercise Vital Sign    On average, how many days per week do you engage in moderate to strenuous exercise (like a brisk walk)?: 0 days    On  average, how many minutes do you engage in exercise at this level?: 0 min  Stress: Stress Concern Present (08/24/2022)   Received from [facility] - Occupational Stress Questionnaire    Feeling of Stress : Very much  Social Connections: Moderately Integrated (03/02/2024)   Social Connection and Isolation Panel    Frequency of Communication with Friends and Family: More than three times a week    Frequency of Social Gatherings with Friends and Family: Once a week    Attends Religious Services: More than 4 times per year    Active Member of [employer] or Organizations: No    Attends Banker Meetings: Never    Marital Status: Married  Catering manager Violence: Not At Risk (03/02/2024)   Humiliation, Afraid, Rape, and Kick questionnaire    Fear of Current or Ex-Partner: No    Emotionally Abused: No    Physically Abused: No    Sexually Abused: No    FAMILY HISTORY: Family History  Problem Relation Age of Onset   Heart disease Mother    Stroke Mother    Coronary artery disease Mother    Alcohol abuse Father    Heart disease Father    Lung cancer Sister    Cancer Sister    Breast cancer Sister    Cancer Brother    Cancer Brother    Kidney cancer Brother    Pneumonia Brother    Prostate cancer Neg Hx    Bladder Cancer Neg Hx     ALLERGIES:  is allergic to gabapentin , codeine, and meloxicam .  MEDICATIONS:  Current Outpatient Medications  Medication Sig Dispense Refill   acetaminophen  (TYLENOL ) 500 MG tablet Take 1,000 mg by mouth every 8 (eight) hours as needed for mild pain or headache.     albuterol  (VENTOLIN  HFA) 108 (90 Base) MCG/ACT inhaler Inhale 2 puffs into the lungs every 6 (six) hours as needed for wheezing or shortness of breath. 8 g 1   amiodarone  (PACERONE ) 200 MG tablet Take 1 tablet (200 mg total) by mouth daily. 30 tablet 0   aspirin  EC 81 MG tablet Take 81 mg by mouth daily. Swallow whole.      Cholecalciferol  50 MCG (2000 UT) CAPS Take 2,000 Units by mouth daily.     dapagliflozin  propanediol (FARXIGA ) 10 MG TABS tablet Take 1 tablet (10 mg  total) by mouth daily. 90 tablet 3   diltiazem  (CARDIZEM  CD) 180 MG 24 hr capsule Take 180 mg by mouth daily.     Fluticasone -Umeclidin-Vilant (TRELEGY ELLIPTA) 100-62.5-25 MCG/ACT AEPB Inhale 1 puff into the lungs daily.     HYDROcodone -acetaminophen  (NORCO/VICODIN) 5-325 MG tablet Take 1 tablet by mouth every 8 (eight) hours as needed for severe pain (pain score 7-10). Must last 30 days 90 tablet 0   ipratropium-albuterol  (DUONEB) 0.5-2.5 (3) MG/3ML SOLN USE 1 VIAL VIA NEBULIZER EVERY 6 HOURS AS NEEDED FOR SHORTNESS OF BREATH OR WHEEZING 1080 mL 0   omeprazole  (PRILOSEC) 40 MG capsule Take 40 mg by mouth daily.     OXYGEN  Inhale 5 L into the lungs continuous.     potassium chloride  SA (KLOR-CON  M) 20 MEQ tablet Take 2 tablets every morning and 1 tablet every evening 270 tablet 3   roflumilast  (DALIRESP ) 500 MCG TABS tablet Take 500 mcg by mouth daily.     sertraline  (ZOLOFT ) 100 MG tablet Take 2 tablets (200 mg total) by mouth daily. Please schedule office visit before any future refill. 60 tablet 0   spironolactone  (ALDACTONE ) 25 MG tablet TAKE 1/2 TABLET BY MOUTH ONCE DAILY 45 tablet 3   torsemide  (DEMADEX ) 20 MG tablet Take 40-80 mg by mouth See admin instructions. 80 mg in the morning, 40 mg in the evening     FUROSCIX  80 MG/10ML CTKT Inject 80 mg into the skin daily as needed (If gain over 3x's weight in fluid). (Patient not taking: Reported on 05/09/2024)     [START ON 05/25/2024] HYDROcodone -acetaminophen  (NORCO/VICODIN) 5-325 MG tablet Take 1 tablet by mouth every 8 (eight) hours as needed for severe pain (pain score 7-10). Must last 30 days (Patient not taking: Reported on 05/09/2024) 90 tablet 0   [START ON 06/24/2024] HYDROcodone -acetaminophen  (NORCO/VICODIN) 5-325 MG tablet Take 1 tablet by mouth every 8 (eight) hours as needed for severe pain  (pain score 7-10). Must last 30 days (Patient not taking: Reported on 05/09/2024) 90 tablet 0   naloxone  (NARCAN ) nasal spray 4 mg/0.1 mL Place 1 spray into the nose as needed for up to 365 doses (for opioid-induced respiratory depresssion). In case of emergency (overdose), spray once into each nostril. If no response within 3 minutes, repeat application and call 911. (Patient not taking: Reported on 05/09/2024) 1 each 0   No current facility-administered medications for this visit.    Review of Systems  Constitutional:  Positive for fatigue. Negative for chills and fever.  HENT:   Negative for hearing loss and voice change.   Eyes:  Negative for eye problems.  Respiratory:  Positive for shortness of breath. Negative for chest tightness and cough.   Cardiovascular:  Negative for chest pain.  Gastrointestinal:  Negative for abdominal distention, abdominal pain and blood in stool.  Endocrine: Negative for hot flashes.  Genitourinary:  Negative for difficulty urinating and frequency.   Musculoskeletal:  Negative for arthralgias.  Skin:  Negative for itching and rash.  Neurological:  Negative for extremity weakness.  Hematological:  Negative for adenopathy. Does not bruise/bleed easily.  Psychiatric/Behavioral:  Negative for confusion.     PHYSICAL EXAMINATION: Vitals:   05/09/24 1422  BP: 124/66  Pulse: 72  Resp: 18  Temp: 98.4 F (36.9 C)  SpO2: 98%   Filed Weights   05/09/24 1422  Weight: 208 lb 1.6 oz (94.4 kg)    Physical Exam Constitutional:      General: She is not in acute distress.  Appearance: She is obese.     Comments: Patient sits in the wheelchair  HENT:     Head: Normocephalic and atraumatic.  Eyes:     General: No scleral icterus. Cardiovascular:     Rate and Rhythm: Normal rate.  Pulmonary:     Effort: Pulmonary effort is normal. No respiratory distress.     Breath sounds: No wheezing.     Comments: Patient breathes comfortably via nasal cannula oxygen .   Decreased breath sound bilaterally Abdominal:     General: Bowel sounds are normal. There is no distension.     Palpations: Abdomen is soft.  Musculoskeletal:        General: No deformity. Normal range of motion.     Cervical back: Normal range of motion and neck supple.  Skin:    General: Skin is warm and dry.     Findings: No erythema or rash.  Neurological:     Mental Status: She is alert and oriented to person, place, and time. Mental status is at baseline.     Cranial Nerves: No cranial nerve deficit.  Psychiatric:        Mood and Affect: Mood normal.      LABORATORY DATA:  I have reviewed the data as listed    Latest Ref Rng & Units 05/07/2024    1:28 PM 05/01/2024   11:38 AM 03/07/2024    4:43 AM  CBC  WBC 4.0 - 10.5 K/uL 7.5     Hemoglobin 12.0 - 15.0 g/dL 87.9  88.3  8.3   Hematocrit 36.0 - 46.0 % 40.0  34.0    Platelets 150 - 400 K/uL 219         Latest Ref Rng & Units 05/01/2024   11:38 AM 03/07/2024    4:43 AM 03/05/2024    4:38 AM  CMP  Glucose 70 - 99 mg/dL  95  85   BUN 8 - 23 mg/dL  9  8   Creatinine 9.55 - 1.00 mg/dL  9.25  9.31   Sodium 864 - 145 mmol/L 139  141  140   Potassium 3.5 - 5.1 mmol/L 3.6  3.1  3.4   Chloride 98 - 111 mmol/L  108  106   CO2 22 - 32 mmol/L  26  28   Calcium  8.9 - 10.3 mg/dL  8.8  9.0       Component Value Date/Time   IRON  53 05/07/2024 1328   TIBC 437 05/07/2024 1328   FERRITIN 50 05/07/2024 1328   IRONPCTSAT 12 05/07/2024 1328     RADIOGRAPHIC STUDIES: I have personally reviewed the radiological images as listed and agreed with the findings in the report. CARDIAC CATHETERIZATION Result Date: 05/02/2024   Hemodynamic findings consistent with moderate pulmonary hypertension.   on 05/01/2024.   No indication for antiplatelet therapy at this time . Conclusion Successful right heart cath right brachial vein approach Mean wedge 17 Mean PA 41 Cardiac output 7.1 RA mean 21 PA sat 67 AO sat 90 room air PVR 3.4 Woods unit Study  consistent with moderate pulmonary hypertension  DG PAIN CLINIC C-ARM 1-60 MIN NO REPORT Result Date: 04/16/2024 Fluoro was used, but no Radiologist interpretation will be provided. Please refer to NOTES tab for provider progress note.

## 2024-05-09 NOTE — Assessment & Plan Note (Addendum)
 Lab Results  Component Value Date   HGB 12.0 05/07/2024   TIBC 437 05/07/2024   IRONPCTSAT 12 05/07/2024   FERRITIN 50 05/07/2024    Recommend weekly Venofer  x 1 for further crease iron  store.

## 2024-05-12 NOTE — Progress Notes (Unsigned)
 PROVIDER NOTE: Interpretation of information contained herein should be left to medically-trained personnel. Specific patient instructions are provided elsewhere under Patient Instructions section of medical record. This document was created in part using AI and STT-dictation technology, any transcriptional errors that may result from this process are unintentional.  Patient: Theresa [name]  Service: E/M   PCP: Odell Chard, Edra GRADE, MD  DOB: 12/17/1955  DOS: 05/13/2024  Provider: Eric DELENA Como, MD  MRN: [id number]  Delivery: Face-to-face  Specialty: Interventional Pain Management  Type: Established Patient  Setting: Ambulatory outpatient facility  Specialty designation: 09  Referring Prov.: Odell Chard, Consuelo *  Location: Outpatient office facility       History of present illness (HPI) [name], a 68 y.o. year old female, is here today because of her Chronic pain of left lower extremity [M79.605, G89.29]. Ms. Stanfill primary complain today is Shoulder Pain (Right ) and Hip Pain (Pain )  Pertinent problems: [name] has Neuritis or radiculitis due to rupture of lumbar intervertebral disc; DDD (degenerative disc disease), lumbar; Carpal tunnel syndrome; Neurogenic pain; Neuropathic pain; Musculoskeletal pain; Lumbar spondylosis (L4-5); Chronic low back pain (1ry area of Pain) (Bilateral) (R>L) w/o sciatica; Chronic lower extremity pain (2ry area of Pain) (Left); Chronic lumbar radicular pain (L4 & S1 Dermatome) (Left); Chronic shoulder pain (Right); Chronic upper extremity pain (Right-sided); Cervical radiculitis (Right side); Abnormal x-ray of lumbar spine; Chronic pain syndrome (significant psychosocial component); 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; 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); Lumbar facet syndrome (Bilateral) (R>L); Chronic sacroiliac joint pain (Bilateral); Lumbar facet hypertrophy (L3-4, L5-S1) (Bilateral); Lumbar spinal  stenosis (L4-5); 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); 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); Lumbosacral radiculopathy at L4; Chronic shoulder pain (Left); Arthralgia of acromioclavicular joint (Right); Acromioclavicular joint DJD (Right); Osteoarthritis of shoulder (Right); Chronic hip pain (3ry area of Pain) (Bilateral) (L>R); Chronic lower extremity pain (Bilateral) (R>L); Cervicalgia (Bilateral) (R>L); Spondylosis without myelopathy or radiculopathy, lumbosacral region; Chronic knee pain (Left); Chronic left-sided lumbar radiculopathy; Abnormal MRI, lumbar spine (12/09/2021); Lumbar disc extrusion (L2-3) (Left); Chronic shoulder pain (Bilateral); Abnormal MRI, cervical spine; Trigger point with back pain; Lumbar trigger point syndrome; Lumbar facet joint pain; Grade 1 Anterolisthesis of lumbar spine (4mm) (L3/L4); Grade 1 Retrolisthesis (2mm) (L5/S1); Chronic hip pain (Right); Chronic groin pain (Right); Buttock pain (Right); Greater trochanteric bursitis (Right); Impaired range of motion of right shoulder; Impaired range of motion of right hip; Osteoarthritis of hip (Right); and Acute on chronic low back pain on their pertinent problem list.  Pain Assessment: Severity of Chronic pain is reported as a 8 /10. Location: Hip Right/Radaited from right hip down to right thigh. Onset: More than a month ago. Quality: Aching, Constant, Throbbing. Timing: Constant. Modifying factor(s): Denies. Vitals:  height is 5' 2 (1.575 m) and weight is 206 lb (93.4 kg). Her temporal temperature is 98.1 F (36.7 C). Her blood pressure is 133/74 and her pulse is 70. Her respiration is 16 and oxygen  saturation is 95%.  BMI: Estimated body mass index is 37.68 kg/m as calculated from the following:   Height as of this encounter: 5' 2 (1.575 m).   Weight as of this encounter: 206 lb (93.4 kg).  Last encounter: 04/10/2024. Last procedure: 04/16/2024.  Reason for encounter: post-procedure evaluation  and assessment. The patient indicates that for the duration of the local anesthetic she had 100% relief of the pain.  Unfortunately in the case of the right hip the pain  returned by the second day.  In the case of the right shoulder the relief continued at a 70% improvement for approximately 4 days after which he gradually started to return.  Alternatives that we have at this point include doing a diagnostic right suprascapular nerve block, which if effective, could be followed by radiofrequency ablation.  In the case of the right hip pain, a diagnostic obturator/femoral nerve block can be done, which again if effective may be followed by radiofrequency ablation.  Prior x-rays of the right hip (06/22/2023) had been read as negative.  And the last time that the patient had an x-ray of the right shoulder was around 2017.  However, she continues to have problems with these two.  Discussed the use of AI scribe software for clinical note transcription with the patient, who gave verbal consent to proceed.  History of Present Illness   Theresa [name] is a 68 year old female who presents with right hip and leg pain.  She experiences severe pain originating in the right hip and radiating down to the back of the knee. The pain is debilitating, affecting her ability to sleep, sit, and perform daily activities. She has tried various treatments including heat pads, ice packs, and topical analgesics like Salonpas, but these have not provided significant relief.  She also mentions intermittent shoulder pain that varies in severity. The shoulder pain is exacerbated by use, but at times it is manageable.      Post-Procedure Evaluation   Procedure No.1: Glenohumeral and acromioclavicular joint Injection #1  Laterality: Right (-RT)  Level: Shoulder  Target: Glenohumeral Joint (shoulder) Location: Intra-articular  Region: Entire Shoulder Area Approach: Anterior approach. Type of procedure: Percutaneous joint  injection  Purpose: Diagnostic/Therapeutic Indications: Shoulder pain severe enough to impact quality of life or function. Rationale (medical necessity): procedure needed and proper for the diagnosis and/or treatment of Ms. Mcclune's medical symptoms and needs. 1. Chronic shoulder pain (Right)   2. Acromioclavicular joint DJD (Right)   3. Arthralgia of acromioclavicular joint (Right)   4. Chronic upper extremity pain (Right-sided)   5. Impaired range of motion of right shoulder    Procedure No.2: Anterior IA Hip injection #1  Laterality: Right (-RT)  Approach: Percutaneous anterolateral approach. Level: Lower pelvic and hip joint level.  Imaging: Fluoroscopy-guided Non-spinal ([id number]) Target: Intra-articular aspect of the hip joint  Region: Hip joint proper. Femoral region Procedure Type: Percutaneous injection  Purpose: Diagnostic/Therapeutic Indications: Hip pain severe enough to impact quality of life or function. Rationale (medical necessity): procedure needed and proper for the diagnosis and/or treatment of Ms. Roycroft's medical symptoms and needs. 1. Chronic groin pain (Right)   2. Chronic hip pain (Right)   3. Impaired range of motion of right hip   4. Osteoarthritis of hip (Right)    Anesthesia: Local anesthesia (1-2% Lidocaine ) Anxiolysis: IV Versed  2.0 mg Sedation: Moderate Sedation None required. No Fentanyl  administered.         DOS: 04/16/2024  Performed by: Eric DELENA Como, MD  NAS-11 Pain score:   Pre-procedure: 9 /10   Post-procedure: 2 /10       Effectiveness:  Initial hour after procedure: 100 %. Subsequent 4-6 hours post-procedure: 100 %. Analgesia past initial 6 hours: 0 % (4 days had 70% on right shoulder and pain gradually returned. On the right hip pain returned the next day completly). Ongoing improvement:  Analgesic: The patient indicates that for the duration of the local anesthetic she had 100% relief of the pain.  Unfortunately in the case  of the right hip the pain returned by the second day.  In the case of the right shoulder the relief continued at a 70% improvement for approximately 4 days after which he gradually started to return. Function: Transient improvement ROM: Transient improvement  Pharmacotherapy Assessment   Opioid Analgesic(s):  Analgesic: Hydrocodone /APAP 5/325 one tablet every 8 hours (15 mg/day of hydrocodone ) MME/day: 15 mg/day.   Monitoring: [place] PMP: PDMP reviewed during this encounter.       Pharmacotherapy: No side-effects or adverse reactions reported. Compliance: No problems identified. Effectiveness: Clinically acceptable.  Bonner Norris, RN  05/13/2024 12:44 PM  Sign when Signing Visit Safety precautions to be maintained throughout the outpatient stay will include: orient to surroundings, keep bed in low position, maintain call bell within reach at all times, provide assistance with transfer out of bed and ambulation.     UDS:  Summary  Date Value Ref Range Status  10/25/2023 FINAL  Final    Comment:    ==================================================================== [employer] [facility] (MW) ==================================================================== Test                             Result       Flag       Units  Drug Present and Declared for Prescription Verification   Hydrocodone                     1227         EXPECTED   ng/mg creat   Hydromorphone                   103          EXPECTED   ng/mg creat   Dihydrocodeine                 146          EXPECTED   ng/mg creat   Norhydrocodone                 518          EXPECTED   ng/mg creat    Sources of hydrocodone  include scheduled prescription medications.    Hydromorphone , dihydrocodeine and norhydrocodone are expected    metabolites of hydrocodone . Hydromorphone  and dihydrocodeine are    also available as scheduled prescription medications.  ==================================================================== Test                       Result    Flag   Units      Ref Range   Creatinine              67               mg/dL      >=79 ==================================================================== Declared Medications:  The flagging and interpretation on this report are based on the  following declared medications.  Unexpected results may arise from  inaccuracies in the declared medications.   **Note: The testing scope of this panel includes these medications:   Hydrocodone  (Norco)   **Note: The testing scope of this panel does not include the  following reported medications:   Acetaminophen  (Tylenol )  Acetaminophen  (Norco)  Albuterol  (Ventolin  HFA)  Albuterol  (Duoneb)  Amiodarone  (Pacerone )  Buspirone  (Buspar )  Cholecalciferol   Dapagliflozin  (Farxiga )  Diltiazem  (Cardizem )  Fluticasone   Furosemide   Ipratropium (Duoneb)  Melatonin  Naloxone  (Narcan )  Omeprazole  (Prilosec)  Polyethylene Glycol (MiraLAX )  Potassium (Klor-Con )  Roflumilast  (Daliresp )  Semaglutide  (Wegovy )  Sertraline  (Zoloft )  Spironolactone  (Aldactone )  Topical Diclofenac  (Voltaren )  Torsemide  (Demadex )  Umeclidinium  Vilanterol ==================================================================== For clinical consultation, please call [phone]. ====================================================================     No results found for: CBDTHCR No results found for: D8THCCBX No results found for: D9THCCBX  ROS  Constitutional: Denies any fever or chills Gastrointestinal: No reported hemesis, hematochezia, vomiting, or acute GI distress Musculoskeletal: Denies any acute onset joint swelling, redness, loss of ROM, or weakness Neurological: No reported episodes of acute onset apraxia, aphasia, dysarthria, agnosia, amnesia, paralysis, loss of coordination, or loss of consciousness  Medication Review  Cholecalciferol , Fluticasone -Umeclidin-Vilant, Furosemide , HYDROcodone -acetaminophen , Oxygen -Helium,  acetaminophen , albuterol , amiodarone , aspirin  EC, dapagliflozin  propanediol, diltiazem , ipratropium-albuterol , naloxone , omeprazole , potassium chloride  SA, roflumilast , sertraline , spironolactone , and torsemide   History Review  Allergy: Ms. Curvin is allergic to gabapentin , codeine, and meloxicam . Drug: Ms. Hefner  reports no history of drug use. Alcohol:  reports no history of alcohol use. Tobacco:  reports that she has quit smoking. Her smoking use included cigarettes. She has a 66 pack-year smoking history. She has never used smokeless tobacco. Social: Ms. Dechaine  reports that she has quit smoking. Her smoking use included cigarettes. She has a 66 pack-year smoking history. She has never used smokeless tobacco. She reports that she does not drink alcohol and does not use drugs. Medical:  has a past medical history of Acute postoperative pain (10/03/2017), Anemia, Anxiety, BiPAP (biphasic positive airway pressure) dependence, Carpal tunnel syndrome, CHF (congestive heart failure) (HCC), CHF (congestive heart failure) (HCC), Chronic generalized abdominal pain, Chronic rhinitis, COPD (chronic obstructive pulmonary disease) (HCC), DDD (degenerative disc disease), cervical, DDD (degenerative disc disease), lumbosacral, Depression, Dyspnea, Edema, Flu, Gastritis, GERD (gastroesophageal reflux disease), Hematuria, Hernia of abdominal wall (07/17/2018), Hernia, abdominal, Hypertension, Kidney stones, Low back pain, Lumbar radiculitis, Malodorous urine, Muscle weakness, Obesity, Oxygen  dependent, Pneumonia due to COVID-19 virus (02/03/2020), Renal cyst, Sensory urge incontinence, Thyroid  activity decreased, Tobacco abuse, and Wheezing. Surgical: Ms. Newstrom  has a past surgical history that includes Abdominal hysterectomy; Tonsillectomy; Carpal tunnel release (Left, 2012); Tubal ligation; epigastric hernia repair (N/A, 08/13/2018); Umbilical hernia repair (N/A, 08/13/2018); RIGHT/LEFT HEART CATH AND CORONARY  ANGIOGRAPHY (N/A, 07/11/2019); TEE without cardioversion (N/A, 04/29/2022); Cardioversion (N/A, 04/29/2022); Esophagogastroduodenoscopy (N/A, 06/21/2022); cataract surgery (Bilateral); Cardioversion (N/A, 01/30/2024); Cardioversion (N/A, 02/19/2024); Flexible sigmoidoscopy (N/A, 03/05/2024); Esophagogastroduodenoscopy (N/A, 03/05/2024); Colonoscopy (N/A, 03/05/2024); Polypectomy (03/05/2024); and RIGHT HEART CATH (Right, 05/01/2024). Family: family history includes Alcohol abuse in her father; Breast cancer in her sister; Cancer in her brother, brother, and sister; Coronary artery disease in her mother; Heart disease in her father and mother; Kidney cancer in her brother; Lung cancer in her sister; Pneumonia in her brother; Stroke in her mother.  Laboratory Chemistry Profile   Renal Lab Results  Component Value Date   BUN 9 03/07/2024   CREATININE 0.74 03/07/2024   BCR 20 06/05/2023   GFRAA >60 06/26/2020   GFRNONAA >60 03/07/2024    Hepatic Lab Results  Component Value Date   AST 30 03/02/2024   ALT 34 03/02/2024   ALBUMIN 3.7 03/02/2024   ALKPHOS 91 03/02/2024   LIPASE 57 (H) 06/19/2022   AMMONIA 40 (H) 10/11/2019    Electrolytes Lab Results  Component Value Date   NA 139 05/01/2024   K 3.6 05/01/2024   CL 108 03/07/2024   CALCIUM  8.8 (L) 03/07/2024   MG 2.8 (H) 03/02/2024   PHOS 3.2 06/21/2022    Bone Lab Results  Component Value Date  25OHVITD1 13 (L) 09/17/2015   25OHVITD2 <1.0 09/17/2015   25OHVITD3 13 09/17/2015    Inflammation (CRP: Acute Phase) (ESR: Chronic Phase) Lab Results  Component Value Date   CRP 0.9 04/02/2022   ESRSEDRATE 17 04/06/2022   LATICACIDVEN 0.9 04/01/2022         Note: Above Lab results reviewed.  Recent Imaging Review  CARDIAC CATHETERIZATION   Hemodynamic findings consistent with moderate pulmonary hypertension.   on 05/01/2024.   No indication for antiplatelet therapy at this time .  Conclusion Successful right heart cath right brachial  vein approach Mean wedge 17 Mean PA 41 Cardiac output 7.1 RA mean 21 PA sat 67 AO sat 90 room air PVR 3.4 Woods unit  Study consistent with moderate pulmonary hypertension Note: Reviewed        Physical Exam  Vitals: BP 133/74 (Patient Position: Sitting, Cuff Size: Normal)   Pulse 70   Temp 98.1 F (36.7 C) (Temporal)   Resp 16   Ht 5' 2 (1.575 m)   Wt 206 lb (93.4 kg)   SpO2 95%   BMI 37.68 kg/m  BMI: Estimated body mass index is 37.68 kg/m as calculated from the following:   Height as of this encounter: 5' 2 (1.575 m).   Weight as of this encounter: 206 lb (93.4 kg). Ideal: Ideal body weight: 50.1 kg (110 lb 7.2 oz) Adjusted ideal body weight: 67.4 kg (148 lb 10.7 oz) General appearance: Well nourished, well developed, and well hydrated. In no apparent acute distress Mental status: Alert, oriented x 3 (person, place, & time)       Respiratory: No evidence of acute respiratory distress Eyes: PERLA   Assessment   Diagnosis Status  1. Chronic lower extremity pain (2ry area of Pain) (Left)   2. Chronic groin pain (Right)   3. Chronic hip pain (Right)   4. Osteoarthritis of hip (Right)   5. Impaired range of motion of right hip   6. Chronic upper extremity pain (Right-sided)   7. Chronic shoulder pain (Right)   8. Arthralgia of acromioclavicular joint (Right)   9. Acromioclavicular joint DJD (Right)   10. Impaired range of motion of right shoulder   11. Osteoarthritis of shoulder (Right)   12. Postop check   13. Chronic pain syndrome (significant psychosocial component)   14. Pharmacologic therapy   15. Chronic use of opiate for therapeutic purpose   16. Encounter for medication management   17. Acute on chronic low back pain   18. Grade 1 Anterolisthesis of lumbar spine (4mm) (L3/L4)   19. Grade 1 Retrolisthesis (2mm) (L5/S1)   20. Lumbar facet joint pain   21. Lumbar facet hypertrophy (L3-4, L5-S1) (Bilateral)   22. Lumbar facet syndrome (Bilateral) (R>L)    23. Spondylosis without myelopathy or radiculopathy, lumbosacral region    Controlled Controlled Controlled   Updated Problems: Problem  Acute On Chronic Low Back Pain    Plan of Care  Problem-specific:  Assessment and Plan    Facet joint pain   Chronic right-sided facet joint pain radiates from the back to the back of the knee, worsened by hyperextension and rotation towards the affected side. Conservative measures like heat, ice, and topical analgesics have been ineffective. Administer facet joint injections for pain management.  Shoulder pain   Intermittent shoulder pain varies in severity and increases with use.       Ms. CHASTY RANDAL has a current medication list which includes the following long-term medication(s): albuterol , amiodarone , hydrocodone -acetaminophen , [  START ON 05/25/2024] hydrocodone -acetaminophen , [START ON 06/24/2024] hydrocodone -acetaminophen , ipratropium-albuterol , potassium chloride  sa, sertraline , and spironolactone .  Pharmacotherapy (Medications Ordered): Meds ordered this encounter  Medications   ketorolac  (TORADOL ) injection 60 mg   methocarbamol  (ROBAXIN ) injection 200 mg   Orders:  Orders Placed This Encounter  Procedures   LUMBAR FACET(MEDIAL BRANCH NERVE BLOCK) MBNB    Diagnosis: Lumbar Facet Syndrome (M47.816); Lumbosacral Facet Syndrome (M47.817); Lumbar Facet Joint Pain (M54.59) Medical Necessity Statement: 1.Severe chronic axial low back pain causing functional impairment documented by ongoing pain scale assessments. 2.Pain present for longer than 3 months (Chronic) documented to have failed noninvasive conservative therapies. 3.Absence of untreated radiculopathy. 4.There is no radiological evidence of untreated fractures, tumor, infection, or deformity.  Physical Examination Findings: Positive Kemp Maneuver: (Y)  Positive Lumbar Hyperextension-Rotation provocative test: (Y)    Standing Status:   Future    Expiration Date:    08/13/2024    Scheduling Instructions:     Procedure: Lumbar facet Block     Type: Medial Branch Block     Side: Bilateral     Purpose: Palliative     Level(s): L3-4, L4-5, and L5-S1 Facets (L2, L3, L4, L5, and S1 Medial Branch)     Sedation: With Sedation.     Timeframe: As soon as schedule allows.    Where will this procedure be performed?:   [facility]   Nursing Instructions:    Please complete this patient's postprocedure evaluation.    Scheduling Instructions:     Please complete this patient's postprocedure evaluation.   Informed Consent Details: Physician/Practitioner Attestation; Transcribe to consent form and obtain patient signature    Do not administer NSAIDs (Toradol , etc.) if patient has an allergy or intolerance to NSAIDs or if patient has CKD (chronic kidney disease or failure). Avoid the use of Muscle Relaxants (orphenedrine/Norflex , etc.) if patient has allergy or intolerance to this medication.    Scheduling Instructions:     Nursing orders: Complete pain questionnaire before administration of therapy. Document location, laterality, onset, level, trigger, and current medications taken for the pain.    Physician/Practitioner attestation of informed consent for procedure/surgical case:   I, the physician/practitioner, attest that I have discussed with the patient the benefits, risks, side effects, alternatives, likelihood of achieving goals and potential problems during recovery for the procedure that I have provided informed consent.    Procedure:   IM injection of therapeutic substance    Physician/Practitioner performing the procedure:   Alisse Tuite A. Tanya, MD    Indication/Reason:   Acute on chronic pain     Interventional Therapies  Risk Factors  Considerations  Medical Comorbidities:  COPD  Emphysema  Chronic Smoker  O2 dependent  SOB  CHF  Hx. of A-Fib (Cardioverted)  Obesity  CAD   NOTE: NO further Lumbar RFA until BMI <35. Patient taken off  of Xarelto  after cardioversion.   Planned  Pending:   Therapeutic/palliative bilateral lumbar facet MBB R8L7    Under consideration:    NOTE: NO further Lumbar RFA until BMI <35.   Completed:   Diagnostic right IA shoulder inj. x1 (04/16/2024) (100/100/70/0)  Diagnostic right anterior IA hip inj. x1 (04/16/2024) (100/100/0/0)  Palliative right lumbar facet MBB x7 (09/20/2022) (100/100/100x5days/0)  Palliative left lumbar facet MBB x6 (11/11/2021) (100/100/0/0)  Therapeutic left L2-3 LESI x4 (05/09/2023) (8-0/10) (100/100/50/>50)  Diagnostic/palliative left L4 TFESI x1 (12/26/2017) (100/100/50/>50)  Diagnostic/palliative left L4-5 LESI x2 (07/30/2019) (100/100/0/0)  Diagnostic/palliative right L4 TFESI x1 (01/25/2018) (100/100/50/50)  Diagnostic/palliative right L4-5  LESI x1 (01/25/2018) (100/100/50/50)  Palliative left lumbar facet RFA x1 (06/27/2017) (0/0/100/>50)  Palliative right lumbar facet RFA x1 (10/03/2017) (100/100/75)    Therapeutic  Palliative (PRN) options:   Evaluation needed before each determination.   Completed by other providers:      Pharmacotherapy  Nonopioids transferred 08/03/2020: Gabapentin  and baclofen  Note: Patient taken off of Xarelto  after cardioversion.     Return for (ECT): (B) L-FCT Blk #R8L7 .    Recent Visits Date Type Provider Dept  04/23/24 Office Visit Patel, Seema K, NP [facility]  04/16/24 Procedure visit Tanya Glisson, MD [facility]  04/10/24 Office Visit Tanya Glisson, MD [facility]  Showing recent visits within past 90 days and meeting all other requirements Today's Visits Date Type Provider Dept  05/13/24 Office Visit Tanya Glisson, MD [facility]  Showing today's visits and meeting all other requirements Future Appointments Date Type Provider Dept  07/22/24 Appointment Patel, Seema K, NP [facility]  Showing future appointments within next 90 days and meeting  all other requirements  I discussed the assessment and treatment plan with the patient. The patient was provided an opportunity to ask questions and all were answered. The patient agreed with the plan and demonstrated an understanding of the instructions.  Patient advised to call back or seek an in-person evaluation if the symptoms or condition worsens.  Duration of encounter: 30 minutes.  Total time on encounter, as per AMA guidelines included both the face-to-face and non-face-to-face time personally spent by the physician and/or other qualified health care professional(s) on the day of the encounter (includes time in activities that require the physician or other qualified health care professional and does not include time in activities normally performed by clinical staff). Physician's time may include the following activities when performed: Preparing to see the patient (e.g., pre-charting review of records, searching for previously ordered imaging, lab work, and nerve conduction tests) Review of prior analgesic pharmacotherapies. Reviewing PMP Interpreting ordered tests (e.g., lab work, imaging, nerve conduction tests) Performing post-procedure evaluations, including interpretation of diagnostic procedures Obtaining and/or reviewing separately obtained history Performing a medically appropriate examination and/or evaluation Counseling and educating the patient/family/caregiver Ordering medications, tests, or procedures Referring and communicating with other health care professionals (when not separately reported) Documenting clinical information in the electronic or other health record Independently interpreting results (not separately reported) and communicating results to the patient/ family/caregiver Care coordination (not separately reported)  Note by: Glisson DELENA Tanya, MD (TTS and AI technology used. I apologize for any typographical errors that were not detected and  corrected.) Date: 05/13/2024; Time: 1:19 PM

## 2024-05-13 ENCOUNTER — Ambulatory Visit: Attending: Pain Medicine | Admitting: Pain Medicine

## 2024-05-13 ENCOUNTER — Encounter: Payer: Self-pay | Admitting: Pain Medicine

## 2024-05-13 VITALS — BP 133/74 | HR 70 | Temp 98.1°F | Resp 16 | Ht 62.0 in | Wt 206.0 lb

## 2024-05-13 DIAGNOSIS — G894 Chronic pain syndrome: Secondary | ICD-10-CM | POA: Diagnosis present

## 2024-05-13 DIAGNOSIS — M545 Low back pain, unspecified: Secondary | ICD-10-CM | POA: Insufficient documentation

## 2024-05-13 DIAGNOSIS — M25511 Pain in right shoulder: Secondary | ICD-10-CM | POA: Insufficient documentation

## 2024-05-13 DIAGNOSIS — M5459 Other low back pain: Secondary | ICD-10-CM | POA: Insufficient documentation

## 2024-05-13 DIAGNOSIS — M79605 Pain in left leg: Secondary | ICD-10-CM | POA: Insufficient documentation

## 2024-05-13 DIAGNOSIS — M19011 Primary osteoarthritis, right shoulder: Secondary | ICD-10-CM | POA: Diagnosis present

## 2024-05-13 DIAGNOSIS — Z79899 Other long term (current) drug therapy: Secondary | ICD-10-CM | POA: Diagnosis present

## 2024-05-13 DIAGNOSIS — M47816 Spondylosis without myelopathy or radiculopathy, lumbar region: Secondary | ICD-10-CM | POA: Insufficient documentation

## 2024-05-13 DIAGNOSIS — M4310 Spondylolisthesis, site unspecified: Secondary | ICD-10-CM | POA: Diagnosis present

## 2024-05-13 DIAGNOSIS — Z09 Encounter for follow-up examination after completed treatment for conditions other than malignant neoplasm: Secondary | ICD-10-CM | POA: Diagnosis present

## 2024-05-13 DIAGNOSIS — Z79891 Long term (current) use of opiate analgesic: Secondary | ICD-10-CM | POA: Diagnosis present

## 2024-05-13 DIAGNOSIS — M25551 Pain in right hip: Secondary | ICD-10-CM | POA: Diagnosis present

## 2024-05-13 DIAGNOSIS — M4316 Spondylolisthesis, lumbar region: Secondary | ICD-10-CM | POA: Insufficient documentation

## 2024-05-13 DIAGNOSIS — M25611 Stiffness of right shoulder, not elsewhere classified: Secondary | ICD-10-CM | POA: Diagnosis present

## 2024-05-13 DIAGNOSIS — G8929 Other chronic pain: Secondary | ICD-10-CM | POA: Diagnosis present

## 2024-05-13 DIAGNOSIS — R1031 Right lower quadrant pain: Secondary | ICD-10-CM | POA: Diagnosis not present

## 2024-05-13 DIAGNOSIS — M1611 Unilateral primary osteoarthritis, right hip: Secondary | ICD-10-CM | POA: Insufficient documentation

## 2024-05-13 DIAGNOSIS — M79601 Pain in right arm: Secondary | ICD-10-CM | POA: Diagnosis present

## 2024-05-13 DIAGNOSIS — M47817 Spondylosis without myelopathy or radiculopathy, lumbosacral region: Secondary | ICD-10-CM | POA: Diagnosis present

## 2024-05-13 DIAGNOSIS — M25651 Stiffness of right hip, not elsewhere classified: Secondary | ICD-10-CM | POA: Insufficient documentation

## 2024-05-13 MED ORDER — KETOROLAC TROMETHAMINE 60 MG/2ML IM SOLN
INTRAMUSCULAR | Status: AC
  Filled 2024-05-13: qty 2

## 2024-05-13 MED ORDER — KETOROLAC TROMETHAMINE 60 MG/2ML IM SOLN
60.0000 mg | Freq: Once | INTRAMUSCULAR | Status: AC
  Administered 2024-05-13: 60 mg via INTRAMUSCULAR

## 2024-05-13 MED ORDER — METHOCARBAMOL 1000 MG/10ML IJ SOLN
200.0000 mg | Freq: Once | INTRAMUSCULAR | Status: AC
  Administered 2024-05-13: 200 mg via INTRAMUSCULAR

## 2024-05-13 MED ORDER — METHOCARBAMOL 1000 MG/10ML IJ SOLN
INTRAMUSCULAR | Status: AC
  Filled 2024-05-13: qty 10

## 2024-05-13 NOTE — Progress Notes (Signed)
 Safety precautions to be maintained throughout the outpatient stay will include: orient to surroundings, keep bed in low position, maintain call bell within reach at all times, provide assistance with transfer out of bed and ambulation.

## 2024-05-13 NOTE — Patient Instructions (Signed)
 ______________________________________________________________________    Procedure instructions  Stop blood-thinners  Do not eat or drink fluids (other than water) for 6 hours before your procedure  No water for 2 hours before your procedure  Take your blood pressure medicine with a sip of water  Arrive 30 minutes before your appointment  If sedation is planned, bring suitable driver. [place], [place], & public transportation are NOT APPROVED)  Carefully read the Preparing for your procedure detailed instructions  If you have questions call us  at [phone]  Procedure appointments are for procedures only.   NO medication refills or new problem evaluations will be done on procedure days.   Only the scheduled, pre-approved procedure and side will be done.   ______________________________________________________________________      ______________________________________________________________________    Preparing for your procedure  Appointments: If you think you may not be able to keep your appointment, call 24-48 hours in advance to cancel. We need time to make it available to others.  Procedure visits are for procedures only. During your procedure appointment there will be: NO Prescription Refills*. NO medication changes or discussions*. NO discussion of disability issues*. NO unrelated pain problem evaluations*. NO evaluations to order other pain procedures*. *These will be addressed at a separate and distinct evaluation encounter on the provider's evaluation schedule and not during procedure days.  Instructions: Food intake: Avoid eating anything solid for at least 8 hours prior to your procedure. Clear liquid intake: You may take clear liquids such as water up to 2 hours prior to your procedure. (No carbonated drinks. No soda.) Transportation: Unless otherwise stated by your physician, bring a driver. (Driver cannot be a Market researcher, Pharmacist, community, or any other form of public  transportation.) Morning Medicines: Except for blood thinners, take all of your other morning medications with a sip of water. Make sure to take your heart and blood pressure medicines. If your blood pressure's lower number is above 100, the case will be rescheduled. Blood thinners: Make sure to stop your blood thinners as instructed.  If you take a blood thinner, but were not instructed to stop it, call our office [phone] and ask to talk to a nurse. Not stopping a blood thinner prior to certain procedures could lead to serious complications. Diabetics on insulin : Notify the staff so that you can be scheduled 1st case in the morning. If your diabetes requires high dose insulin , take only  of your normal insulin  dose the morning of the procedure and notify the staff that you have done so. Preventing infections: Shower with an antibacterial soap the morning of your procedure.  Build-up your immune system: Take 1000 mg of Vitamin C with every meal (3 times a day) the day prior to your procedure. Antibiotics: Inform the nursing staff if you are taking any antibiotics or if you have any conditions that may require antibiotics prior to procedures. (Example: recent joint implants)   Pregnancy: If you are pregnant make sure to notify the nursing staff. Not doing so may result in injury to the fetus, including death.  Sickness: If you have a cold, fever, or any active infections, call and cancel or reschedule your procedure. Receiving steroids while having an infection may result in complications. Arrival: You must be in the facility at least 30 minutes prior to your scheduled procedure. Tardiness: Your scheduled time is also the cutoff time. If you do not arrive at least 15 minutes prior to your procedure, you will be rescheduled.  Children: Do not bring any children  with you. Make arrangements to keep them home. Dress appropriately: There is always a possibility that your clothing may get soiled. Avoid  long dresses. Valuables: Do not bring any jewelry or valuables.  Reasons to call and reschedule or cancel your procedure: (Following these recommendations will minimize the risk of a serious complication.) Surgeries: Avoid having procedures within 2 weeks of any surgery. (Avoid for 2 weeks before or after any surgery). Flu Shots: Avoid having procedures within 2 weeks of a flu shots or . (Avoid for 2 weeks before or after immunizations). Barium: Avoid having a procedure within 7-10 days after having had a radiological study involving the use of radiological contrast. (Myelograms, Barium swallow or enema study). Heart attacks: Avoid any elective procedures or surgeries for the initial 6 months after a Myocardial Infarction (Heart Attack). Blood thinners: It is imperative that you stop these medications before procedures. Let us  know if you if you take any blood thinner.  Infection: Avoid procedures during or within two weeks of an infection (including chest colds or gastrointestinal problems). Symptoms associated with infections include: Localized redness, fever, chills, night sweats or profuse sweating, burning sensation when voiding, cough, congestion, stuffiness, runny nose, sore throat, diarrhea, nausea, vomiting, cold or Flu symptoms, recent or current infections. It is specially important if the infection is over the area that we intend to treat. Heart and lung problems: Symptoms that may suggest an active cardiopulmonary problem include: cough, chest pain, breathing difficulties or shortness of breath, dizziness, ankle swelling, uncontrolled high or unusually low blood pressure, and/or palpitations. If you are experiencing any of these symptoms, cancel your procedure and contact your primary care physician for an evaluation.  Remember:  Regular Business hours are:  Monday to Thursday 8:00 AM to 4:00 PM  Provider's Schedule: Eric Como, MD:  Procedure days: Tuesday and Thursday 7:30  AM to 4:00 PM  Wallie Sherry, MD:  Procedure days: Monday and Wednesday 7:30 AM to 4:00 PM Last  Updated: 09/26/2023 ______________________________________________________________________      ______________________________________________________________________    General Risks and Possible Complications  Patient Responsibilities: It is important that you read this as it is part of your informed consent. It is our duty to inform you of the risks and possible complications associated with treatments offered to you. It is your responsibility as a patient to read this and to ask questions about anything that is not clear or that you believe was not covered in this document.  Patient's Rights: You have the right to refuse treatment. You also have the right to change your mind, even after initially having agreed to have the treatment done. However, under this last option, if you wait until the last second to change your mind, you may be charged for the materials used up to that point.  Introduction: Medicine is not an Visual merchandiser. Everything in Medicine, including the lack of treatment(s), carries the potential for danger, harm, or loss (which is by definition: Risk). In Medicine, a complication is a secondary problem, condition, or disease that can aggravate an already existing one. All treatments carry the risk of possible complications. The fact that a side effects or complications occurs, does not imply that the treatment was conducted incorrectly. It must be clearly understood that these can happen even when everything is done following the highest safety standards.  No treatment: You can choose not to proceed with the proposed treatment alternative. The "PRO(s)" would include: avoiding the risk of complications associated with the therapy. The "CON(s)"  would include: not getting any of the treatment benefits. These benefits fall under one of three categories: diagnostic; therapeutic; and/or  palliative. Diagnostic benefits include: getting information which can ultimately lead to improvement of the disease or symptom(s). Therapeutic benefits are those associated with the successful treatment of the disease. Finally, palliative benefits are those related to the decrease of the primary symptoms, without necessarily curing the condition (example: decreasing the pain from a flare-up of a chronic condition, such as incurable terminal cancer).  General Risks and Complications: These are associated to most interventional treatments. They can occur alone, or in combination. They fall under one of the following six (6) categories: no benefit or worsening of symptoms; bleeding; infection; nerve damage; allergic reactions; and/or death. No benefits or worsening of symptoms: In Medicine there are no guarantees, only probabilities. No healthcare provider can ever guarantee that a medical treatment will work, they can only state the probability that it may. Furthermore, there is always the possibility that the condition may worsen, either directly, or indirectly, as a consequence of the treatment. Bleeding: This is more common if the patient is taking a blood thinner, either prescription or over the counter (example: Goody Powders, Fish oil, Aspirin , Garlic, etc.), or if suffering a condition associated with impaired coagulation (example: [name], cirrhosis of the liver, low platelet counts, etc.). However, even if you do not have one on these, it can still happen. If you have any of these conditions, or take one of these drugs, make sure to notify your treating physician. Infection: This is more common in patients with a compromised immune system, either due to disease (example: diabetes, cancer, human immunodeficiency virus [HIV], etc.), or due to medications or treatments (example: therapies used to treat cancer and rheumatological diseases). However, even if you do not have one on these, it can still  happen. If you have any of these conditions, or take one of these drugs, make sure to notify your treating physician. Nerve Damage: This is more common when the treatment is an invasive one, but it can also happen with the use of medications, such as those used in the treatment of cancer. The damage can occur to small secondary nerves, or to large primary ones, such as those in the spinal cord and brain. This damage may be temporary or permanent and it may lead to impairments that can range from temporary numbness to permanent paralysis and/or brain death. Allergic Reactions: Any time a substance or material comes in contact with our body, there is the possibility of an allergic reaction. These can range from a mild skin rash (contact dermatitis) to a severe systemic reaction (anaphylactic reaction), which can result in death. Death: In general, any medical intervention can result in death, most of the time due to an unforeseen complication. ______________________________________________________________________      ______________________________________________________________________    Blood Thinners  IMPORTANT NOTICE:  If you take any of these, make sure to notify the nursing staff.  Failure to do so may result in serious injury.  Recommended time intervals to stop and restart blood-thinners, before & after invasive procedures  Generic Name Brand Name Pre-procedure: Stop medication for this amount of time before your procedure: Post-procedure: Wait this amount of time after the procedure before restarting your medication:  Abciximab Reopro 15 days 2 hrs  Alteplase Activase 10 days 10 days  Anagrelide Agrylin    Apixaban Eliquis 3 days 6 hrs  Cilostazol Pletal 3 days 5 hrs  Clopidogrel  Plavix  7-10 days 2  hrs  Dabigatran Pradaxa 5 days 6 hrs  Dalteparin Fragmin 24 hours 4 hrs  Dipyridamole Aggrenox 11days 2 hrs  Edoxaban Lixiana; Savaysa 3 days 2 hrs  Enoxaparin   Lovenox  24 hours 4 hrs   Eptifibatide Integrillin 8 hours 2 hrs  Fondaparinux  Arixtra 72 hours 12 hrs  Hydroxychloroquine Plaquenil 11 days   Prasugrel Effient 7-10 days 6 hrs  Reteplase Retavase 10 days 10 days  Rivaroxaban Xarelto 3 days 6 hrs  Ticagrelor Brilinta 5-7 days 6 hrs  Ticlopidine Ticlid 10-14 days 2 hrs  Tinzaparin Innohep 24 hours 4 hrs  Tirofiban Aggrastat 8 hours 2 hrs  Warfarin Coumadin 5 days 2 hrs   Other medications with blood-thinning effects  NOTE: Consider stopping these if you have prolonged bleeding despite not taking any of the above blood thinners. Otherwise ask your provider and this will be decided on a case-by-case basis.  Product indications Generic (Brand) names Note  Cholesterol Lipitor Stop 4 days before procedure  Blood thinner (injectable) Heparin  (LMW or LMWH Heparin ) Stop 24 hours before procedure  Cancer Ibrutinib (Imbruvica) Stop 7 days before procedure  Malaria/Rheumatoid Hydroxychloroquine (Plaquenil) Stop 11 days before procedure  Thrombolytics  10 days before or after procedures   Over-the-counter (OTC) Products with blood-thinning effects  NOTE: Consider stopping these if you have prolonged bleeding despite not taking any of the above blood thinners. Otherwise ask your provider and this will be decided on a case-by-case basis.  Product Common names Stop Time  Aspirin  > 325 mg Goody Powders, Excedrin, etc. 11 days  Aspirin  <= 81 mg  7 days  Fish oil  4 days  Garlic supplements  7 days  Ginkgo biloba  36 hours  Ginseng  24 hours  NSAIDs Ibuprofen , Naprosyn , etc. 3 days  Vitamin E  4 days   ______________________________________________________________________

## 2024-05-21 ENCOUNTER — Telehealth: Payer: Self-pay

## 2024-05-21 NOTE — Telephone Encounter (Signed)
 [facility] needs to know that an RFA is planned in order to approve the lumbar facets. In the notes I see that an RFA will be performed after the patient has brought her BMI down below 35. Once that has happened he will be able to do the RFA. Please consider approving the facets due to the patients pain level being an 8 at this time.

## 2024-05-24 ENCOUNTER — Telehealth: Payer: Self-pay | Admitting: Family

## 2024-05-24 MED ORDER — TORSEMIDE 20 MG PO TABS: 40.0000 mg | ORAL_TABLET | ORAL | 5 refills | Status: DC

## 2024-05-24 NOTE — Telephone Encounter (Signed)
Medication refill sent to pharmacy  

## 2024-06-04 ENCOUNTER — Other Ambulatory Visit: Payer: Self-pay

## 2024-06-04 ENCOUNTER — Inpatient Hospital Stay
Admission: EM | Admit: 2024-06-04 | Discharge: 2024-06-09 | DRG: 872 | Disposition: A | Discharge status: Home / Self Care

## 2024-06-04 ENCOUNTER — Emergency Department

## 2024-06-04 DIAGNOSIS — Z9981 Dependence on supplemental oxygen: Secondary | ICD-10-CM | POA: Diagnosis not present

## 2024-06-04 DIAGNOSIS — G8929 Other chronic pain: Secondary | ICD-10-CM | POA: Diagnosis present

## 2024-06-04 DIAGNOSIS — Z6837 Body mass index (BMI) 37.0-37.9, adult: Secondary | ICD-10-CM | POA: Diagnosis not present

## 2024-06-04 DIAGNOSIS — Z823 Family history of stroke: Secondary | ICD-10-CM | POA: Diagnosis not present

## 2024-06-04 DIAGNOSIS — E871 Hypo-osmolality and hyponatremia: Secondary | ICD-10-CM | POA: Diagnosis present

## 2024-06-04 DIAGNOSIS — B370 Candidal stomatitis: Secondary | ICD-10-CM | POA: Diagnosis present

## 2024-06-04 DIAGNOSIS — Z888 Allergy status to other drugs, medicaments and biological substances status: Secondary | ICD-10-CM | POA: Diagnosis not present

## 2024-06-04 DIAGNOSIS — I110 Hypertensive heart disease with heart failure: Secondary | ICD-10-CM | POA: Diagnosis present

## 2024-06-04 DIAGNOSIS — Z811 Family history of alcohol abuse and dependence: Secondary | ICD-10-CM

## 2024-06-04 DIAGNOSIS — Z7982 Long term (current) use of aspirin: Secondary | ICD-10-CM | POA: Diagnosis not present

## 2024-06-04 DIAGNOSIS — Z801 Family history of malignant neoplasm of trachea, bronchus and lung: Secondary | ICD-10-CM | POA: Diagnosis not present

## 2024-06-04 DIAGNOSIS — N10 Acute tubulo-interstitial nephritis: Secondary | ICD-10-CM | POA: Diagnosis present

## 2024-06-04 DIAGNOSIS — Z8249 Family history of ischemic heart disease and other diseases of the circulatory system: Secondary | ICD-10-CM

## 2024-06-04 DIAGNOSIS — M51379 Other intervertebral disc degeneration, lumbosacral region without mention of lumbar back pain or lower extremity pain: Secondary | ICD-10-CM | POA: Diagnosis present

## 2024-06-04 DIAGNOSIS — M5030 Other cervical disc degeneration, unspecified cervical region: Secondary | ICD-10-CM | POA: Diagnosis present

## 2024-06-04 DIAGNOSIS — E876 Hypokalemia: Secondary | ICD-10-CM | POA: Diagnosis present

## 2024-06-04 DIAGNOSIS — A419 Sepsis, unspecified organism: Principal | ICD-10-CM

## 2024-06-04 DIAGNOSIS — Z8051 Family history of malignant neoplasm of kidney: Secondary | ICD-10-CM

## 2024-06-04 DIAGNOSIS — Z87891 Personal history of nicotine dependence: Secondary | ICD-10-CM

## 2024-06-04 DIAGNOSIS — R251 Tremor, unspecified: Secondary | ICD-10-CM | POA: Diagnosis present

## 2024-06-04 DIAGNOSIS — N179 Acute kidney failure, unspecified: Secondary | ICD-10-CM | POA: Diagnosis present

## 2024-06-04 DIAGNOSIS — E66812 Obesity, class 2: Secondary | ICD-10-CM | POA: Diagnosis present

## 2024-06-04 DIAGNOSIS — J441 Chronic obstructive pulmonary disease with (acute) exacerbation: Secondary | ICD-10-CM | POA: Diagnosis present

## 2024-06-04 DIAGNOSIS — Z885 Allergy status to narcotic agent status: Secondary | ICD-10-CM

## 2024-06-04 DIAGNOSIS — Z79899 Other long term (current) drug therapy: Secondary | ICD-10-CM

## 2024-06-04 DIAGNOSIS — Z803 Family history of malignant neoplasm of breast: Secondary | ICD-10-CM

## 2024-06-04 DIAGNOSIS — A4151 Sepsis due to Escherichia coli [E. coli]: Principal | ICD-10-CM | POA: Diagnosis present

## 2024-06-04 DIAGNOSIS — Z7951 Long term (current) use of inhaled steroids: Secondary | ICD-10-CM

## 2024-06-04 DIAGNOSIS — J9611 Chronic respiratory failure with hypoxia: Secondary | ICD-10-CM | POA: Diagnosis present

## 2024-06-04 HISTORY — DX: Unspecified atrial fibrillation: I48.91

## 2024-06-04 LAB — MAGNESIUM: Magnesium: 2.2 mg/dL (ref 1.7–2.4)

## 2024-06-04 LAB — URINALYSIS, ROUTINE W REFLEX MICROSCOPIC
Bilirubin Urine: NEGATIVE
Glucose, UA: >=500 mg/dL — AB
Ketones, ur: NEGATIVE mg/dL
Nitrite: NEGATIVE
Protein, ur: 100 mg/dL — AB
Specific Gravity, Urine: 1.013 (ref 1.005–1.030)
WBC, UA: >50 WBC/hpf (ref 0–5)
pH: 6 (ref 5.0–8.0)

## 2024-06-04 LAB — URINALYSIS, W/ REFLEX TO CULTURE (INFECTION SUSPECTED)
Bilirubin Urine: NEGATIVE
Glucose, UA: >=500 mg/dL — AB
Ketones, ur: NEGATIVE mg/dL
Nitrite: NEGATIVE
Protein, ur: 100 mg/dL — AB
Specific Gravity, Urine: 1.014 (ref 1.005–1.030)
WBC, UA: >50 WBC/hpf (ref 0–5)
pH: 6 (ref 5.0–8.0)

## 2024-06-04 LAB — COMPREHENSIVE METABOLIC PANEL WITH GFR
ALT: 38 U/L (ref 0–44)
AST: 29 U/L (ref 15–41)
Albumin: 3.4 g/dL — ABNORMAL LOW (ref 3.5–5.0)
Alkaline Phosphatase: 109 U/L (ref 38–126)
Anion gap: 13 (ref 5–15)
BUN: 18 mg/dL (ref 8–23)
CO2: 24 mmol/L (ref 22–32)
Calcium: 9.1 mg/dL (ref 8.9–10.3)
Chloride: 96 mmol/L — ABNORMAL LOW (ref 98–111)
Creatinine, Ser: 1.15 mg/dL — ABNORMAL HIGH (ref 0.44–1.00)
GFR, Estimated: 52 mL/min — ABNORMAL LOW (ref >60)
Glucose, Bld: 118 mg/dL — ABNORMAL HIGH (ref 70–99)
Potassium: 2.9 mmol/L — ABNORMAL LOW (ref 3.5–5.1)
Sodium: 133 mmol/L — ABNORMAL LOW (ref 135–145)
Total Bilirubin: 0.9 mg/dL (ref 0.0–1.2)
Total Protein: 7 g/dL (ref 6.5–8.1)

## 2024-06-04 LAB — BLOOD GAS, VENOUS
Acid-Base Excess: 2.8 mmol/L — ABNORMAL HIGH (ref 0.0–2.0)
Bicarbonate: 28.5 mmol/L — ABNORMAL HIGH (ref 20.0–28.0)
O2 Saturation: 97.3 %
Patient temperature: 37
pCO2, Ven: 47 mmHg (ref 44–60)
pH, Ven: 7.39 (ref 7.25–7.43)
pO2, Ven: 98 mmHg — ABNORMAL HIGH (ref 32–45)

## 2024-06-04 LAB — CBC
HCT: 38.2 % (ref 36.0–46.0)
Hemoglobin: 12.4 g/dL (ref 12.0–15.0)
MCH: 29.2 pg (ref 26.0–34.0)
MCHC: 32.5 g/dL (ref 30.0–36.0)
MCV: 89.9 fL (ref 80.0–100.0)
Platelets: 152 K/uL (ref 150–400)
RBC: 4.25 MIL/uL (ref 3.87–5.11)
RDW: 16.4 % — ABNORMAL HIGH (ref 11.5–15.5)
WBC: 30.6 K/uL — ABNORMAL HIGH (ref 4.0–10.5)
nRBC: 0 % (ref 0.0–0.2)

## 2024-06-04 LAB — LACTIC ACID, PLASMA: Lactic Acid, Venous: 1.1 mmol/L (ref 0.5–1.9)

## 2024-06-04 LAB — TROPONIN I (HIGH SENSITIVITY)
Troponin I (High Sensitivity): 21 ng/L — ABNORMAL HIGH (ref <18)
Troponin I (High Sensitivity): 47 ng/L — ABNORMAL HIGH (ref <18)

## 2024-06-04 MED ORDER — HYDROMORPHONE HCL 1 MG/ML IJ SOLN
1.0000 mg | Freq: Once | INTRAMUSCULAR | Status: AC
  Administered 2024-06-04: 1 mg via INTRAVENOUS
  Filled 2024-06-04: qty 1

## 2024-06-04 MED ORDER — VANCOMYCIN HCL 2000 MG/400ML IV SOLN
2000.0000 mg | Freq: Once | INTRAVENOUS | Status: DC
  Administered 2024-06-04: 2000 mg via INTRAVENOUS
  Filled 2024-06-04: qty 400

## 2024-06-04 MED ORDER — POTASSIUM CHLORIDE 10 MEQ/100ML IV SOLN
10.0000 meq | INTRAVENOUS | Status: AC
  Administered 2024-06-04 (×2): 10 meq via INTRAVENOUS
  Filled 2024-06-04 (×2): qty 100

## 2024-06-04 MED ORDER — ASPIRIN 81 MG PO TBEC
81.0000 mg | DELAYED_RELEASE_TABLET | Freq: Every day | ORAL | Status: DC
  Administered 2024-06-05 – 2024-06-09 (×5): 81 mg via ORAL
  Filled 2024-06-04 (×5): qty 1

## 2024-06-04 MED ORDER — HYDROCODONE-ACETAMINOPHEN 5-325 MG PO TABS: 1.0000 | ORAL_TABLET | Freq: Three times a day (TID) | ORAL | Status: DC | PRN

## 2024-06-04 MED ORDER — ONDANSETRON HCL 4 MG/2ML IJ SOLN
4.0000 mg | Freq: Four times a day (QID) | INTRAMUSCULAR | Status: DC | PRN
  Administered 2024-06-04 – 2024-06-05 (×2): 4 mg via INTRAVENOUS
  Filled 2024-06-04 (×2): qty 2

## 2024-06-04 MED ORDER — ONDANSETRON 4 MG PO TBDP: 4.0000 mg | ORAL_TABLET | Freq: Three times a day (TID) | ORAL | Status: DC | PRN

## 2024-06-04 MED ORDER — IPRATROPIUM-ALBUTEROL 0.5-2.5 (3) MG/3ML IN SOLN
3.0000 mL | Freq: Four times a day (QID) | RESPIRATORY_TRACT | Status: DC | PRN
  Administered 2024-06-04 – 2024-06-09 (×5): 3 mL via RESPIRATORY_TRACT
  Filled 2024-06-04 (×5): qty 3

## 2024-06-04 MED ORDER — HYDROMORPHONE HCL 1 MG/ML IJ SOLN
0.5000 mg | Freq: Once | INTRAMUSCULAR | Status: AC
  Administered 2024-06-04: 0.5 mg via INTRAVENOUS
  Filled 2024-06-04: qty 0.5

## 2024-06-04 MED ORDER — POTASSIUM CHLORIDE CRYS ER 20 MEQ PO TBCR
40.0000 meq | EXTENDED_RELEASE_TABLET | ORAL | Status: AC
  Administered 2024-06-04 (×2): 40 meq via ORAL
  Filled 2024-06-04 (×2): qty 2

## 2024-06-04 MED ORDER — METHYLPREDNISOLONE SODIUM SUCC 125 MG IJ SOLR
125.0000 mg | Freq: Once | INTRAMUSCULAR | Status: AC
  Administered 2024-06-04: 125 mg via INTRAVENOUS
  Filled 2024-06-04: qty 2

## 2024-06-04 MED ORDER — SODIUM CHLORIDE 0.9 % IV SOLN
2.0000 g | Freq: Once | INTRAVENOUS | Status: AC
  Administered 2024-06-04: 2 g via INTRAVENOUS
  Filled 2024-06-04: qty 12.5

## 2024-06-04 MED ORDER — HYDROCODONE-ACETAMINOPHEN 5-325 MG PO TABS
1.0000 | ORAL_TABLET | ORAL | Status: DC | PRN
  Administered 2024-06-05 – 2024-06-09 (×6): 1 via ORAL
  Filled 2024-06-04 (×6): qty 1

## 2024-06-04 MED ORDER — VANCOMYCIN HCL IN DEXTROSE 1-5 GM/200ML-% IV SOLN: 1000.0000 mg | Freq: Once | INTRAVENOUS | Status: DC

## 2024-06-04 MED ORDER — METRONIDAZOLE 500 MG/100ML IV SOLN
500.0000 mg | Freq: Once | INTRAVENOUS | Status: AC
  Administered 2024-06-04: 500 mg via INTRAVENOUS
  Filled 2024-06-04: qty 100

## 2024-06-04 MED ORDER — ENOXAPARIN SODIUM 60 MG/0.6ML IJ SOSY
45.0000 mg | PREFILLED_SYRINGE | INTRAMUSCULAR | Status: DC
  Administered 2024-06-04 – 2024-06-08 (×5): 45 mg via SUBCUTANEOUS
  Filled 2024-06-04 (×5): qty 0.6

## 2024-06-04 MED ORDER — IPRATROPIUM-ALBUTEROL 0.5-2.5 (3) MG/3ML IN SOLN
3.0000 mL | Freq: Once | RESPIRATORY_TRACT | Status: AC
  Administered 2024-06-04: 3 mL via RESPIRATORY_TRACT
  Filled 2024-06-04: qty 3

## 2024-06-04 MED ORDER — POLYETHYLENE GLYCOL 3350 17 G PO PACK: 17.0000 g | PACK | Freq: Two times a day (BID) | ORAL | Status: DC | PRN

## 2024-06-04 MED ORDER — SODIUM CHLORIDE 0.9 % IV BOLUS
1000.0000 mL | Freq: Once | INTRAVENOUS | Status: AC
  Administered 2024-06-04: 1000 mL via INTRAVENOUS

## 2024-06-04 MED ORDER — HYDROMORPHONE HCL 1 MG/ML IJ SOLN
1.0000 mg | INTRAMUSCULAR | Status: DC | PRN
  Administered 2024-06-04 – 2024-06-07 (×5): 1 mg via INTRAVENOUS
  Filled 2024-06-04 (×5): qty 1

## 2024-06-04 MED ORDER — SERTRALINE HCL 50 MG PO TABS
200.0000 mg | ORAL_TABLET | Freq: Every day | ORAL | Status: DC
  Administered 2024-06-05 – 2024-06-09 (×5): 200 mg via ORAL
  Filled 2024-06-04 (×5): qty 4

## 2024-06-04 MED ORDER — PANTOPRAZOLE SODIUM 40 MG PO TBEC
40.0000 mg | DELAYED_RELEASE_TABLET | Freq: Every day | ORAL | Status: DC
  Administered 2024-06-04 – 2024-06-09 (×6): 40 mg via ORAL
  Filled 2024-06-04 (×6): qty 1

## 2024-06-04 MED ORDER — BUDESON-GLYCOPYRROL-FORMOTEROL 160-9-4.8 MCG/ACT IN AERO
2.0000 | INHALATION_SPRAY | Freq: Two times a day (BID) | RESPIRATORY_TRACT | Status: DC
  Administered 2024-06-04 – 2024-06-09 (×10): 2 via RESPIRATORY_TRACT
  Filled 2024-06-04 (×2): qty 5.9

## 2024-06-04 MED ORDER — ACETAMINOPHEN 500 MG PO TABS
1000.0000 mg | ORAL_TABLET | Freq: Three times a day (TID) | ORAL | Status: DC | PRN
  Administered 2024-06-04 – 2024-06-08 (×5): 1000 mg via ORAL
  Filled 2024-06-04 (×5): qty 2

## 2024-06-04 MED ORDER — SODIUM CHLORIDE 0.9 % IV SOLN
2.0000 g | INTRAVENOUS | Status: DC
  Administered 2024-06-05 – 2024-06-09 (×5): 2 g via INTRAVENOUS
  Filled 2024-06-04 (×5): qty 20

## 2024-06-04 MED ORDER — IOHEXOL 350 MG/ML SOLN
100.0000 mL | Freq: Once | INTRAVENOUS | Status: AC | PRN
  Administered 2024-06-04: 100 mL via INTRAVENOUS

## 2024-06-04 MED ORDER — AMIODARONE HCL 200 MG PO TABS
200.0000 mg | ORAL_TABLET | Freq: Every day | ORAL | Status: DC
  Administered 2024-06-05 – 2024-06-09 (×5): 200 mg via ORAL
  Filled 2024-06-04 (×5): qty 1

## 2024-06-04 MED ORDER — DILTIAZEM HCL ER COATED BEADS 180 MG PO CP24
180.0000 mg | ORAL_CAPSULE | Freq: Every day | ORAL | Status: DC
  Administered 2024-06-04 – 2024-06-09 (×6): 180 mg via ORAL
  Filled 2024-06-04 (×7): qty 1

## 2024-06-04 MED ORDER — ROFLUMILAST 500 MCG PO TABS
500.0000 ug | ORAL_TABLET | Freq: Every day | ORAL | Status: DC
  Administered 2024-06-05 – 2024-06-09 (×5): 500 ug via ORAL
  Filled 2024-06-04 (×5): qty 1

## 2024-06-04 NOTE — ED Triage Notes (Addendum)
 Pt to ED for N/V and R side (flank?) pain since 2 days (vomiting started yesterday). Pt wears 4L at baseline, currently wearing 5L and has been using 5-6 L last couple days because also feels SOB. RR 24, unlabored.. Pain radiates up toward R arm and neck. LBM 2 days ago. States urine has strong odor.  Skin is dry. Expiratory wheezing heard without stethoscope. Hx CHF, cardioversion for a fib. No thinners. Blue top sent.

## 2024-06-04 NOTE — Sepsis Progress Note (Signed)
 Elink monitoring for the code sepsis protocol.

## 2024-06-04 NOTE — Consult Note (Signed)
 CODE SEPSIS - PHARMACY COMMUNICATION  **Broad Spectrum Antibiotics should be administered within 1 hour of Sepsis diagnosis**  Time Code Sepsis Called/Page Received: 1451  Antibiotics Ordered: cefepime , azithromycin , vancomycin   Time of 1st antibiotic administration: 1524  Additional action taken by pharmacy: n/a  If necessary, Name of Provider/Nurse Contacted: n/a    Berneita Sanagustin A Srinidhi Landers ,PharmD Clinical Pharmacist  06/04/2024  2:52 PM

## 2024-06-04 NOTE — ED Provider Notes (Addendum)
 [facility] Provider Note    Event Date/Time   First MD Initiated Contact with Patient 06/04/24 1413     (approximate)   History   Shortness of Breath, Flank Pain, and Emesis   HPI  Theresa [name] is a 68 y.o. female with a history of atrial fibrillation status post cardioversion, previously on Xarelto  but this was discontinued secondary to nosebleeding, on chronic oxygen  of 4 L who comes in with concerns for nausea vomiting and right sided back pain.  Patient reports over the past 2 days having to increase oxygen  levels.  Patient does report increasing urine smell.  Patient reports her pain is mostly on the right flank.  She reports that because of the pain she feels like she is splinting her breathing.  She does report pain with breathing.  She also reports a headache that is been gradually worsening over the past couple of days.  She denies any falls or hitting her head but she states that this is atypical for her.    Physical Exam   Triage Vital Signs: ED Triage Vitals  Encounter Vitals Group     BP 06/04/24 1352 118/61     Girls Systolic BP Percentile --      Girls Diastolic BP Percentile --      Boys Systolic BP Percentile --      Boys Diastolic BP Percentile --      Pulse Rate 06/04/24 1352 96     Resp 06/04/24 1352 (!) 24     Temp 06/04/24 1352 98.4 F (36.9 C)     Temp Source 06/04/24 1352 Oral     SpO2 06/04/24 1352 96 %     Weight 06/04/24 1356 204 lb (92.5 kg)     Height 06/04/24 1356 5' 2 (1.575 m)     Head Circumference --      Peak Flow --      Pain Score 06/04/24 1352 9     Pain Loc --      Pain Education --      Exclude from Growth Chart --     Most recent vital signs: Vitals:   06/04/24 1352  BP: 118/61  Pulse: 96  Resp: (!) 24  Temp: 98.4 F (36.9 C)  SpO2: 96%     General: Awake, no distress.  CV:  Good peripheral perfusion.  Resp:  Slight increased work of breathing slight wheeze noted. Abd:  No distention.   Tender in the right lower quadrant Other:     ED Results / Procedures / Treatments   Labs (all labs ordered are listed, but only abnormal results are displayed) Labs Reviewed  CBC - Abnormal; Notable for the following components:      Result Value   WBC 30.6 (*)    RDW 16.4 (*)    All other components within normal limits  COMPREHENSIVE METABOLIC PANEL WITH GFR - Abnormal; Notable for the following components:   Sodium 133 (*)    Potassium 2.9 (*)    Chloride 96 (*)    Glucose, Bld 118 (*)    Creatinine, Ser 1.15 (*)    Albumin 3.4 (*)    GFR, Estimated 52 (*)    All other components within normal limits  TROPONIN I (HIGH SENSITIVITY) - Abnormal; Notable for the following components:   Troponin I (High Sensitivity) 21 (*)    All other components within normal limits  URINALYSIS, ROUTINE W REFLEX MICROSCOPIC  MAGNESIUM   BLOOD GAS,  VENOUS     EKG  My interpretation of EKG:  Sinus rate of 96 without any ST elevation or T wave inversions.  Difficult to see T waves however to tell if there is prolonged QTc.  They read her QTc at 677 but I am not sure if it is actually that long or if it is just because of her EKG.  RADIOLOGY I have reviewed the xray personally and interpreted no evidence of any pneumonia   PROCEDURES:  Critical Care performed: Yes, see critical care procedure note(s)  .1-3 Lead EKG Interpretation  Performed by: Ernest Ronal BRAVO, MD Authorized by: Ernest Ronal BRAVO, MD     Interpretation: abnormal     ECG rate:  90   ECG rate assessment: normal     Rhythm: sinus tachycardia     Ectopy: none     Conduction: normal   .Critical Care  Performed by: Ernest Ronal BRAVO, MD Authorized by: Ernest Ronal BRAVO, MD   Critical care provider statement:    Critical care time (minutes):  30   Critical care was necessary to treat or prevent imminent or life-threatening deterioration of the following conditions:  Sepsis   Critical care was time spent personally by me on the  following activities:  Development of treatment plan with patient or surrogate, discussions with consultants, evaluation of patient's response to treatment, examination of patient, ordering and review of laboratory studies, ordering and review of radiographic studies, ordering and performing treatments and interventions, pulse oximetry, re-evaluation of patient's condition and review of old charts    MEDICATIONS ORDERED IN ED: Medications  potassium chloride  10 mEq in 100 mL IVPB (has no administration in time range)  ceFEPIme  (MAXIPIME ) 2 g in sodium chloride  0.9 % 100 mL IVPB (has no administration in time range)  metroNIDAZOLE  (FLAGYL ) IVPB 500 mg (has no administration in time range)  vancomycin  (VANCOCIN ) IVPB 1000 mg/200 mL premix (has no administration in time range)  sodium chloride  0.9 % bolus 1,000 mL (has no administration in time range)  ipratropium-albuterol  (DUONEB) 0.5-2.5 (3) MG/3ML nebulizer solution 3 mL (3 mLs Nebulization Given 06/04/24 1439)  methylPREDNISolone  sodium succinate (SOLU-MEDROL ) 125 mg/2 mL injection 125 mg (125 mg Intravenous Given 06/04/24 1439)  HYDROmorphone  (DILAUDID ) injection 0.5 mg (0.5 mg Intravenous Given 06/04/24 1439)     IMPRESSION / MDM / ASSESSMENT AND PLAN / ED COURSE  I reviewed the triage vital signs and the nursing notes.   Patient's presentation is most consistent with acute presentation with potential threat to life or bodily function.   Patient comes in with pain in her abdomen and right chest wall x-ray without any obvious pneumonia.  Could be PE versus acute abdominal pathology versus UTI.  She did have a little bit of wheeze increased work of breathing and not sure if this just related to her pain.  Patient was given some Dilaudid  DuoNeb and Solu-Medrol .  After this was given her CBC came back  Patient's white count came back at 30,000 and his elevated respiratory rate therefore sepsis alert was called patient was given 1 L of fluid and  broad-spectrum antibiotics given unclear source.  I am holding off on any Zofran  due to her QTc possibly been elongated.  Her potassium is low so given some IV repletion and patient will require a repeat EKG after this is complete to see if we can order any additional antinausea medications.  She is not currently vomiting.  I have ordered CT imaging to evaluate for  acute pathology such as intercranial hemorrhage, PE, acute abdominal issues such as kidney stone, abscess, obstruction.  Patient be handed off to oncoming team pending the CT imaging.  Given patient's elevated white count I do suspect patient will require admission for further workup of sepsis.   The patient is on the cardiac monitor to evaluate for evidence of arrhythmia and/or significant heart rate changes.      FINAL CLINICAL IMPRESSION(S) / ED DIAGNOSES   Final diagnoses:  Sepsis, due to unspecified organism, unspecified whether acute organ dysfunction present [facility])     Rx / DC Orders   ED Discharge Orders     None        Note:  This document was prepared using Dragon voice recognition software and may include unintentional dictation errors.   Ernest Ronal BRAVO, MD 06/04/24 1453    Ernest Ronal BRAVO, MD 06/13/24 [phone]

## 2024-06-04 NOTE — H&P (Signed)
 History and Physical    Theresa [name] FMW:[id number] DOB: 06/11/1956 DOA: 06/04/2024  PCP: Theresa Tor Edra CINDERELLA, MD  Patient coming from: home  I have personally briefly reviewed patient's old medical records in [facility]  Chief Complaint: N/V and abdominal pain  HPI: Theresa [name] is a 68 y.o. female with medical history significant of COPD on 4L O2, dCHF, Afib not on anticoagulation, who presented with N/V and abdominal pain.    3 days ago, pt developed pain in her abdomen that radiated up to her neck.  Started having N/V 2 days ago, and couldn't keep even liquid down.  No diarrhea.  Denied dysuria, but reported urine smelled bad.  Continued to have good urine output with home diuretics.  Reported dyspnea but stable on home 4L O2.  No prior hx of UTI.  ED Course: initial vitals: afebrile pulse 96, BP 118/61, RR 24, sating 96% on 5L.  Labs notable for potassium 2.9, WBC 30.6, UA large Hgb, large leuk.  CTA chest no PE and no acute finding.  CT a/p showed bilateral pyelonephritis.  Pt received 2L NS, Vanc/cefe/flagyl  in the ED before admission.  Assessment/Plan  # Acute bilateral pyelonephritis --started on Vanc/cefe/flagyl  --add-on urine cx --transition to ceftriaxone   # Sepsis --tachycardia, leukocytosis, source pyelo  # COPD with  # Chronic hypoxemic respiratory failure --stable.  Some increased dyspnea likely due to sepsis. --cont home 4L O2 --cont home bronchodilators  # Hx of Afib --not on anticoagulation due to hx of GI bleed --cont home amiodarone  and cardizem   # AKI --Cr 1.15 on presentation.  Received 2L NS in the ED. --hold further IVF due to hx of CHF --hold home torsemide  and aldactone   # Chronic dCHF --hold home torsemide  and aldactone  due to AKI  # Hypokalemia --monitor and supplement PRN  # Chronic pain on chronic opioids --increase home Norco frequency due to acute abdominal pain from Pyelo   DVT prophylaxis: Lovenox  SQ Code  Status: Full code  Family Communication: husband updated at bedside on admission  Disposition Plan: home  Consults called: none Level of care: Med-Surg   Review of Systems: As per HPI otherwise complete review of systems negative.   Past Medical History:  Diagnosis Date   Acute postoperative pain 10/03/2017   Anemia    Anxiety    Atrial fibrillation (HCC)    BiPAP (biphasic positive airway pressure) dependence    at hs   Carpal tunnel syndrome    CHF (congestive heart failure) (HCC)    1/18   CHF (congestive heart failure) (HCC)    Chronic generalized abdominal pain    Chronic rhinitis    COPD (chronic obstructive pulmonary disease) (HCC)    DDD (degenerative disc disease), cervical    DDD (degenerative disc disease), lumbosacral    Depression    Dyspnea    Edema    Flu    1/18   Gastritis    GERD (gastroesophageal reflux disease)    Hematuria    Hernia of abdominal wall 07/17/2018   Hernia, abdominal    Hypertension    Kidney stones    Low back pain    Lumbar radiculitis    Malodorous urine    Muscle weakness    Obesity    Oxygen  dependent    Pneumonia due to COVID-19 virus 02/03/2020   Renal cyst    Sensory urge incontinence    Thyroid  activity decreased    9/19   Tobacco abuse  Wheezing     Past Surgical History:  Procedure Laterality Date   ABDOMINAL HYSTERECTOMY     CARDIOVERSION N/A 04/29/2022   Procedure: CARDIOVERSION;  Surgeon: Hester Wolm PARAS, MD;  Location: [facility];  Service: Cardiovascular;  Laterality: N/A;   CARDIOVERSION N/A 01/30/2024   Procedure: CARDIOVERSION;  Surgeon: Florencio Cara BIRCH, MD;  Location: [facility];  Service: Cardiovascular;  Laterality: N/A;   CARDIOVERSION N/A 02/19/2024   Procedure: CARDIOVERSION;  Surgeon: Florencio Cara BIRCH, MD;  Location: [facility];  Service: Cardiovascular;  Laterality: N/A;   CARPAL TUNNEL RELEASE Left 2012   cataract surgery Bilateral    COLONOSCOPY N/A 03/05/2024   Procedure: COLONOSCOPY;   Surgeon: Jinny Carmine, MD;  Location: [facility];  Service: Endoscopy;  Laterality: N/A;   EPIGASTRIC HERNIA REPAIR N/A 08/13/2018   HERNIA REPAIR EPIGASTRIC ADULT; polypropylene mesh reinforcement.  Surgeon: Dessa Reyes ORN, MD;  Location: [facility];  Service: General;  Laterality: N/A;   ESOPHAGOGASTRODUODENOSCOPY N/A 06/21/2022   Procedure: ESOPHAGOGASTRODUODENOSCOPY (EGD);  Surgeon: Toledo, Ladell POUR, MD;  Location: [facility];  Service: Gastroenterology;  Laterality: N/A;   ESOPHAGOGASTRODUODENOSCOPY N/A 03/05/2024   Procedure: EGD (ESOPHAGOGASTRODUODENOSCOPY);  Surgeon: Jinny Carmine, MD;  Location: [facility];  Service: Endoscopy;  Laterality: N/A;   FLEXIBLE SIGMOIDOSCOPY N/A 03/05/2024   Procedure: KINGSTON SIDE;  Surgeon: Jinny Carmine, MD;  Location: [facility];  Service: Endoscopy;  Laterality: N/A;   POLYPECTOMY  03/05/2024   Procedure: POLYPECTOMY, INTESTINE;  Surgeon: Jinny Carmine, MD;  Location: [facility];  Service: Endoscopy;;   RIGHT HEART CATH Right 05/01/2024   Procedure: RIGHT HEART CATH;  Surgeon: Florencio Cara BIRCH, MD;  Location: [facility];  Service: Cardiovascular;  Laterality: Right;   RIGHT/LEFT HEART CATH AND CORONARY ANGIOGRAPHY N/A 07/11/2019   Procedure: RIGHT/LEFT HEART CATH AND CORONARY ANGIOGRAPHY;  Surgeon: Florencio Cara BIRCH, MD;  Location: [facility];  Service: Cardiovascular;  Laterality: N/A;   TEE WITHOUT CARDIOVERSION N/A 04/29/2022   Procedure: TRANSESOPHAGEAL ECHOCARDIOGRAM (TEE);  Surgeon: Hester Wolm PARAS, MD;  Location: [facility];  Service: Cardiovascular;  Laterality: N/A;   TONSILLECTOMY     TUBAL LIGATION     UMBILICAL HERNIA REPAIR N/A 08/13/2018   HERNIA REPAIR UMBILICAL ADULT; polypropylene mesh reinforcement Surgeon: Dessa Reyes ORN, MD;  Location: [facility];  Service: General;  Laterality: N/A;     reports that she has quit smoking. Her smoking use included cigarettes. She has a 66 pack-year  smoking history. She has never used smokeless tobacco. She reports that she does not drink alcohol and does not use drugs.  Allergies  Allergen Reactions   Gabapentin  Other (See Comments)    Higher doses of 800 mg, 600 mg, 300 mg causes excessive sedation and contributes to numerous mechanical falls. (Unsure if 100 mg dose can be tolerated)   Codeine Nausea And Vomiting   Meloxicam  Other (See Comments)    Stomach pain    Family History  Problem Relation Age of Onset   Heart disease Mother    Stroke Mother    Coronary artery disease Mother    Alcohol abuse Father    Heart disease Father    Lung cancer Sister    Cancer Sister    Breast cancer Sister    Cancer Brother    Cancer Brother    Kidney cancer Brother    Pneumonia Brother    Prostate cancer Neg Hx    Bladder Cancer Neg Hx     Prior to Admission medications  Medication Sig Start Date End Date Taking? Authorizing Provider  acetaminophen  (TYLENOL ) 500 MG tablet Take 1,000 mg by mouth every 8 (eight) hours as needed for mild pain or headache.   Yes [provider]  albuterol  (VENTOLIN  HFA) 108 (90 Base) MCG/ACT inhaler Inhale 2 puffs into the lungs every 6 (six) hours as needed for wheezing or shortness of breath. 10/23/19  Yes Edelmiro, [place], MD  amiodarone  (PACERONE ) 200 MG tablet Take 1 tablet (200 mg total) by mouth daily. 04/30/22  Yes Laurita Pillion, MD  aspirin  EC 81 MG tablet Take 81 mg by mouth daily. Swallow whole.   Yes [provider]  Cholecalciferol  50 MCG (2000 UT) CAPS Take 2,000 Units by mouth daily.   Yes [provider]  dapagliflozin  propanediol (FARXIGA ) 10 MG TABS tablet Take 1 tablet (10 mg total) by mouth daily. 07/31/23  Yes Hackney, Ellouise A, FNP  diltiazem  (CARDIZEM  CD) 180 MG 24 hr capsule Take 180 mg by mouth daily.   Yes [provider]  Fluticasone -Umeclidin-Vilant (TRELEGY ELLIPTA) 100-62.5-25 MCG/ACT AEPB Inhale 1 puff into the lungs daily.   Yes [provider]  HYDROcodone -acetaminophen  (NORCO/VICODIN) 5-325 MG tablet Take 1 tablet by mouth every 8 (eight) hours as needed for severe pain (pain score 7-10). Must last 30 days 05/25/24 06/24/24 Yes Patel, Seema K, NP  omeprazole  (PRILOSEC) 40 MG capsule Take 40 mg by mouth daily.   Yes [provider]  potassium chloride  SA (KLOR-CON  M) 20 MEQ tablet Take 2 tablets every morning and 1 tablet every evening 03/27/24  Yes Hackney, Neri Vieyra A, FNP  roflumilast  (DALIRESP ) 500 MCG TABS tablet Take 500 mcg by mouth daily.   Yes [provider]  sertraline  (ZOLOFT ) 100 MG tablet Take 2 tablets (200 mg total) by mouth daily. Please schedule office visit before any future refill. 03/28/23  Yes Gasper Nancyann BRAVO, MD  spironolactone  (ALDACTONE ) 25 MG tablet TAKE 1/2 TABLET BY MOUTH ONCE DAILY 03/26/24  Yes Donette Ellouise A, FNP  torsemide  (DEMADEX ) 20 MG tablet Take 2-4 tablets (40-80 mg total) by mouth See admin instructions. 80 mg in the morning, 40 mg in the evening 05/24/24  Yes Hackney, Ellouise LABOR, FNP  HYDROcodone -acetaminophen  (NORCO/VICODIN) 5-325 MG tablet Take 1 tablet by mouth every 8 (eight) hours as needed for severe pain (pain score 7-10). Must last 30 days 04/25/24 05/25/24  Patel, Seema K, NP  HYDROcodone -acetaminophen  (NORCO/VICODIN) 5-325 MG tablet Take 1 tablet by mouth every 8 (eight) hours as needed for severe pain (pain score 7-10). Must last 30 days 06/24/24 07/24/24  Patel, Seema K, NP  ipratropium-albuterol  (DUONEB) 0.5-2.5 (3) MG/3ML SOLN USE 1 VIAL VIA NEBULIZER EVERY 6 HOURS AS NEEDED FOR SHORTNESS OF BREATH OR WHEEZING 11/30/20   Chrismon, Marinda BRAVO, PA-C  naloxone  (NARCAN ) nasal spray 4 mg/0.1 mL Place 1 spray into the nose as needed for up to 365 doses (for opioid-induced respiratory depresssion). In case of emergency (overdose), spray once into each nostril. If no response within 3 minutes, repeat application and call 911. Patient not taking: No sig reported 07/26/23 07/25/24  Tanya Glisson, MD  OXYGEN  Inhale 5 L into the lungs continuous.    [provider]    Physical Exam: Vitals:   06/04/24 1533 06/04/24 1600 06/04/24 1630 06/04/24 1730  BP: 118/60 113/61 117/61 (!) 115/59  Pulse: 91 92 92 95  Resp: 19 (!) 27 (!) 23 (!) 23  Temp:      TempSrc:  SpO2: 94% 90% 97% 96%  Weight:      Height:        Constitutional: NAD, AAOx3 HEENT: conjunctivae and lids normal, EOMI CV: No cyanosis.   RESP: normal respiratory effort, on 3.5L Extremities: No effusions, edema in BLE SKIN: warm, dry Neuro: II - XII grossly intact.   Psych: Normal mood and affect.  Appropriate judgement and reason  Labs on Admission: I have personally reviewed labs and imaging studies  Time spent: 70 minutes  Ellouise Haber MD [facility]  If 7PM-7AM, please contact night-coverage 06/04/2024, 5:57 PM

## 2024-06-05 LAB — RESPIRATORY PANEL BY PCR

## 2024-06-05 LAB — BLOOD CULTURE ID PANEL (REFLEXED) - BCID2

## 2024-06-05 LAB — CBC
HCT: 36.9 % (ref 36.0–46.0)
Hemoglobin: 11.5 g/dL — ABNORMAL LOW (ref 12.0–15.0)
MCH: 28.8 pg (ref 26.0–34.0)
MCHC: 31.2 g/dL (ref 30.0–36.0)
MCV: 92.5 fL (ref 80.0–100.0)
Platelets: 136 K/uL — ABNORMAL LOW (ref 150–400)
RBC: 3.99 MIL/uL (ref 3.87–5.11)
RDW: 16.3 % — ABNORMAL HIGH (ref 11.5–15.5)
WBC: 42.7 K/uL — ABNORMAL HIGH (ref 4.0–10.5)
nRBC: 0 % (ref 0.0–0.2)

## 2024-06-05 LAB — BASIC METABOLIC PANEL WITH GFR
Anion gap: 10 (ref 5–15)
BUN: 21 mg/dL (ref 8–23)
CO2: 23 mmol/L (ref 22–32)
Calcium: 9 mg/dL (ref 8.9–10.3)
Chloride: 103 mmol/L (ref 98–111)
Creatinine, Ser: 1.13 mg/dL — ABNORMAL HIGH (ref 0.44–1.00)
GFR, Estimated: 53 mL/min — ABNORMAL LOW (ref >60)
Glucose, Bld: 180 mg/dL — ABNORMAL HIGH (ref 70–99)
Potassium: 2.9 mmol/L — ABNORMAL LOW (ref 3.5–5.1)
Sodium: 136 mmol/L (ref 135–145)

## 2024-06-05 LAB — TROPONIN I (HIGH SENSITIVITY): Troponin I (High Sensitivity): 31 ng/L — ABNORMAL HIGH (ref <18)

## 2024-06-05 LAB — POTASSIUM: Potassium: 4.5 mmol/L (ref 3.5–5.1)

## 2024-06-05 LAB — URINE CULTURE: Culture: <10000 — AB

## 2024-06-05 LAB — MAGNESIUM: Magnesium: 2.4 mg/dL (ref 1.7–2.4)

## 2024-06-05 MED ORDER — POTASSIUM CHLORIDE CRYS ER 20 MEQ PO TBCR
40.0000 meq | EXTENDED_RELEASE_TABLET | ORAL | Status: AC
  Administered 2024-06-05 (×2): 40 meq via ORAL
  Filled 2024-06-05 (×2): qty 2

## 2024-06-05 MED ORDER — POTASSIUM CHLORIDE CRYS ER 20 MEQ PO TBCR
40.0000 meq | EXTENDED_RELEASE_TABLET | Freq: Once | ORAL | Status: AC
  Administered 2024-06-05: 40 meq via ORAL
  Filled 2024-06-05: qty 2

## 2024-06-05 MED ORDER — PROCHLORPERAZINE EDISYLATE 10 MG/2ML IJ SOLN
10.0000 mg | Freq: Four times a day (QID) | INTRAMUSCULAR | Status: DC | PRN
  Administered 2024-06-06 – 2024-06-09 (×4): 10 mg via INTRAVENOUS
  Filled 2024-06-05 (×4): qty 2

## 2024-06-05 MED ORDER — PROCHLORPERAZINE MALEATE 5 MG PO TABS: 5.0000 mg | ORAL_TABLET | Freq: Four times a day (QID) | ORAL | Status: DC | PRN

## 2024-06-05 NOTE — Plan of Care (Signed)

## 2024-06-05 NOTE — Progress Notes (Signed)
 PROGRESS NOTE    Theresa [name]  FMW:[id number] DOB: Oct 17, 1956 DOA: 06/04/2024 PCP: Odell Tor Edra CINDERELLA, MD  Chief Complaint  Patient presents with   Shortness of Breath   Flank Pain   Emesis    Hospital Course:  Theresa [name] is a 68 y.o. female with medical history significant of COPD on 4L O2, dCHF, Afib not on anticoagulation, who presented with N/V and abdominal pain.     3 days ago, pt developed pain in her abdomen that radiated up to her neck.  Started having N/V 2 days ago, and couldn't keep even liquid down.  No diarrhea.  Denied dysuria, but reported urine smelled bad.  Continued to have good urine output with home diuretics.  Reported dyspnea but stable on home 4L O2.  No prior hx of UTI.   ED Course: initial vitals: afebrile pulse 96, BP 118/61, RR 24, sating 96% on 5L.  Labs notable for potassium 2.9, WBC 30.6, UA large Hgb, large leuk.  CTA chest no PE and no acute finding.  CT a/p showed bilateral pyelonephritis.  Pt received 2L NS, Vanc/cefe/flagyl  in the ED before admission  Subjective: Patient was examined at the bedside, new to me today.  States feels somewhat better than yesterday, still has right-sided flank pain and also headache. Spouse present at the bedside, discussed updates and answered all questions   Objective: Vitals:   06/04/24 2053 06/04/24 2304 06/05/24 0420 06/05/24 0817  BP: 116/69 104/61 (!) 107/56 (!) 102/58  Pulse: (!) 108 (!) 109 88 82  Resp: (!) 25 (!) 24 (!) 22 (!) 25  Temp: 97.8 F (36.6 C) 99.1 F (37.3 C) 98.2 F (36.8 C) 98 F (36.7 C)  TempSrc:      SpO2: 95% 95% 98% 94%  Weight:      Height:       No intake or output data in the 24 hours ending 06/05/24 0916 Filed Weights   06/04/24 1356  Weight: 92.5 kg    Examination: Constitutional: NAD, AAOx3 HEENT: conjunctivae and lids normal, EOMI CV: No cyanosis.   RESP: normal respiratory effort, on 4L Abd: Soft, distended due to body habitus, R CVA TTP > Left  side Extremities: No effusions, edema in BLE SKIN: warm, dry Neuro: II - XII grossly intact.   Psych: Normal mood and affect.  Appropriate judgement and reason  Assessment & Plan:  Principal Problem:   Acute pyelonephritis  E.coli bacteremia Acute bilateral pyelonephritis --started on Vanc/cefe/flagyl  - Bcx Enterobacterales, E.coli, pending sensitivitues - Ucx pending - CT abd bilateral pyelonephritis, right > left, Small amount of air in the bladder may be related to recent instrumentation/infection - On IV ceftriaxone    Sepsis --tachycardia, leukocytosis worsening (also received Solumedrol on adm), source pyelo - LA normal   COPD with  Chronic hypoxemic respiratory failure --stable.  Some increased dyspnea likely due to sepsis. --cont home 4L O2 --cont home bronchodilators --Resp viral panel pending   Hx of Afib --not on anticoagulation due to hx of GI bleed --cont home amiodarone  and cardizem    AKI --Cr 1.15 -> 1.13 on presentation.  Received 2L NS in the ED. --hold further IVF due to hx of CHF --hold home torsemide  and aldactone    Chronic dCHF --hold home torsemide  and aldactone  due to AKI   Hypokalemia --monitor and supplement PRN - Mag normal   Chronic pain on chronic opioids --increase home Norco frequency due to acute abdominal pain from Pyelo  Prolonged Qtc -- change Zofran   to Compazine  -- Monitor Qtc  Incidental findings: Nonobstructing left renal calculus. Stable 1 cm hypodensity in the uncinate process of the pancreas -recommend MRI follow-up in 1 year  DVT prophylaxis: Lovenox  SQ   Code Status: Full Code Disposition:  Home  Consultants:  None  Procedures:  None  Antimicrobials:  Anti-infectives (From admission, onward)    Start     Dose/Rate Route Frequency Ordered Stop   06/05/24 0400  cefTRIAXone  (ROCEPHIN ) 2 g in sodium chloride  0.9 % 100 mL IVPB        2 g 200 mL/hr over 30 Minutes Intravenous Every 24 hours 06/04/24 1842      06/04/24 1500  ceFEPIme  (MAXIPIME ) 2 g in sodium chloride  0.9 % 100 mL IVPB        2 g 200 mL/hr over 30 Minutes Intravenous  Once 06/04/24 1451 06/04/24 1630   06/04/24 1500  metroNIDAZOLE  (FLAGYL ) IVPB 500 mg        500 mg 100 mL/hr over 60 Minutes Intravenous  Once 06/04/24 1451 06/04/24 1808   06/04/24 1500  vancomycin  (VANCOCIN ) IVPB 1000 mg/200 mL premix  Status:  Discontinued        1,000 mg 200 mL/hr over 60 Minutes Intravenous  Once 06/04/24 1451 06/04/24 1451   06/04/24 1500  vancomycin  (VANCOREADY) IVPB 2000 mg/400 mL  Status:  Discontinued        2,000 mg 200 mL/hr over 120 Minutes Intravenous  Once 06/04/24 1452 06/04/24 1800       Data Reviewed: I have personally reviewed following labs and imaging studies CBC: Recent Labs  Lab 06/04/24 1358 06/05/24 0215  WBC 30.6* 42.7*  HGB 12.4 11.5*  HCT 38.2 36.9  MCV 89.9 92.5  PLT 152 136*   Basic Metabolic Panel: Recent Labs  Lab 06/04/24 1358 06/04/24 1532 06/05/24 0215  NA 133*  --  136  K 2.9*  --  2.9*  CL 96*  --  103  CO2 24  --  23  GLUCOSE 118*  --  180*  BUN 18  --  21  CREATININE 1.15*  --  1.13*  CALCIUM  9.1  --  9.0  MG  --  2.2 2.4   GFR: Estimated Creatinine Clearance: 51.2 mL/min (A) (by C-G formula based on SCr of 1.13 mg/dL (H)). Liver Function Tests: Recent Labs  Lab 06/04/24 1358  AST 29  ALT 38  ALKPHOS 109  BILITOT 0.9  PROT 7.0  ALBUMIN 3.4*   CBG: No results for input(s): GLUCAP in the last 168 hours.  Recent Results (from the past 240 hours)  Blood culture (routine x 2)     Status: None (Preliminary result)   Collection Time: 06/04/24  3:15 PM   Specimen: BLOOD  Result Value Ref Range Status   Specimen Description BLOOD LEFT ANTECUBITAL  Final   Special Requests   Final    BOTTLES DRAWN AEROBIC AND ANAEROBIC Blood Culture results may not be optimal due to an inadequate volume of blood received in culture bottles   Culture  Setup Time   Final    Organism ID to  follow GRAM NEGATIVE RODS AEROBIC BOTTLE ONLY CRITICAL RESULT CALLED TO, READ BACK BY AND VERIFIED WITHBETHA TERESIA DILLS PHARMD [id number] 06/05/24 HNM Performed at [facility], [address] [place], [place] [address]    Culture GRAM NEGATIVE RODS  Final   Report Status PENDING  Incomplete  Blood Culture ID Panel (Reflexed)     Status: Abnormal   Collection  Time: 06/04/24  3:15 PM  Result Value Ref Range Status   Enterococcus faecalis NOT DETECTED NOT DETECTED Final   Enterococcus Faecium NOT DETECTED NOT DETECTED Final   Listeria monocytogenes NOT DETECTED NOT DETECTED Final   Staphylococcus species NOT DETECTED NOT DETECTED Final   Staphylococcus aureus (BCID) NOT DETECTED NOT DETECTED Final   Staphylococcus epidermidis NOT DETECTED NOT DETECTED Final   Staphylococcus lugdunensis NOT DETECTED NOT DETECTED Final   Streptococcus species NOT DETECTED NOT DETECTED Final   Streptococcus agalactiae NOT DETECTED NOT DETECTED Final   Streptococcus pneumoniae NOT DETECTED NOT DETECTED Final   Streptococcus pyogenes NOT DETECTED NOT DETECTED Final   A.calcoaceticus-baumannii NOT DETECTED NOT DETECTED Final   Bacteroides fragilis NOT DETECTED NOT DETECTED Final   Enterobacterales DETECTED (A) NOT DETECTED Final    Comment: Enterobacterales represent a large order of gram negative bacteria, not a single organism. CRITICAL RESULT CALLED TO, READ BACK BY AND VERIFIED WITH: NATHAN BELUE PHARMD [id number] 06/05/24 HNM    Enterobacter cloacae complex NOT DETECTED NOT DETECTED Final   Escherichia coli DETECTED (A) NOT DETECTED Final    Comment: CRITICAL RESULT CALLED TO, READ BACK BY AND VERIFIED WITH: NATHAN BELUE PHARMD [id number] 06/05/24 HNM    Klebsiella aerogenes NOT DETECTED NOT DETECTED Final   Klebsiella oxytoca NOT DETECTED NOT DETECTED Final   Klebsiella pneumoniae NOT DETECTED NOT DETECTED Final   Proteus species NOT DETECTED NOT DETECTED Final   Salmonella species NOT DETECTED NOT DETECTED  Final   Serratia marcescens NOT DETECTED NOT DETECTED Final   Haemophilus influenzae NOT DETECTED NOT DETECTED Final   Neisseria meningitidis NOT DETECTED NOT DETECTED Final   Pseudomonas aeruginosa NOT DETECTED NOT DETECTED Final   Stenotrophomonas maltophilia NOT DETECTED NOT DETECTED Final   Candida albicans NOT DETECTED NOT DETECTED Final   Candida auris NOT DETECTED NOT DETECTED Final   Candida glabrata NOT DETECTED NOT DETECTED Final   Candida krusei NOT DETECTED NOT DETECTED Final   Candida parapsilosis NOT DETECTED NOT DETECTED Final   Candida tropicalis NOT DETECTED NOT DETECTED Final   Cryptococcus neoformans/gattii NOT DETECTED NOT DETECTED Final   CTX-M ESBL NOT DETECTED NOT DETECTED Final   Carbapenem resistance IMP NOT DETECTED NOT DETECTED Final   Carbapenem resistance KPC NOT DETECTED NOT DETECTED Final   Carbapenem resistance NDM NOT DETECTED NOT DETECTED Final   Carbapenem resist OXA 48 LIKE NOT DETECTED NOT DETECTED Final   Carbapenem resistance VIM NOT DETECTED NOT DETECTED Final    Comment: Performed at [facility], [address] [place], [place] [address]  Blood culture (routine x 2)     Status: None (Preliminary result)   Collection Time: 06/04/24  3:20 PM   Specimen: BLOOD  Result Value Ref Range Status   Specimen Description BLOOD RIGHT ARM  Final   Special Requests   Final    BOTTLES DRAWN AEROBIC AND ANAEROBIC Blood Culture results may not be optimal due to an inadequate volume of blood received in culture bottles   Culture  Setup Time   Final    GRAM NEGATIVE RODS AEROBIC BOTTLE ONLY CRITICAL VALUE NOTED.  VALUE IS CONSISTENT WITH PREVIOUSLY REPORTED AND CALLED VALUE. Performed at [facility], [address] [place], [place] [address]    Culture GRAM NEGATIVE RODS  Final   Report Status PENDING  Incomplete     Radiology Studies: CT Angio Chest PE W and/or Wo Contrast Result Date: 06/04/2024 CLINICAL DATA:  Nausea and  vomiting, right-sided flank  pain for 2 days, increased oxygen  requirement, short of breath EXAM: CT ANGIOGRAPHY CHEST WITH CONTRAST TECHNIQUE: Multidetector CT imaging of the chest was performed using the standard protocol during bolus administration of intravenous contrast. Multiplanar CT image reconstructions and MIPs were obtained to evaluate the vascular anatomy. RADIATION DOSE REDUCTION: This exam was performed according to the departmental dose-optimization program which includes automated exposure control, adjustment of the mA and/or kV according to patient size and/or use of iterative reconstruction technique. CONTRAST:  OMNIPAQUE  IOHEXOL  350 MG/ML SOLN COMPARISON:  10/17/2023, 06/04/2024 FINDINGS: Cardiovascular: This is a technically adequate evaluation of the pulmonary vasculature. No filling defects or pulmonary emboli. Borderline cardiomegaly with prominent right atrial dilatation. No pericardial effusion. No evidence of thoracic aortic aneurysm or dissection. Atherosclerosis of the aorta and coronary vasculature. Mediastinum/Nodes: No enlarged mediastinal, hilar, or axillary lymph nodes. Thyroid  gland, trachea, and esophagus demonstrate no significant findings. Lungs/Pleura: Upper lobe predominant emphysema. No airspace disease, effusion, or pneumothorax. Minimal subsegmental atelectasis or scarring at the lung bases. Central airways are patent. Upper Abdomen: No acute abnormality. Musculoskeletal: No acute or destructive bony abnormalities. Reconstructed images demonstrate no additional findings. Review of the MIP images confirms the above findings. IMPRESSION: 1. No evidence of pulmonary embolus. 2. Borderline cardiomegaly with prominent right atrial dilatation. 3. Aortic Atherosclerosis (ICD10-I70.0) and Emphysema (ICD10-J43.9). 4. Coronary artery atherosclerosis. Electronically Signed   By: Ozell Daring M.D.   On: 06/04/2024 16:25   CT ABDOMEN PELVIS W CONTRAST Result Date:  06/04/2024 CLINICAL DATA:  Acute abdominal pain EXAM: CT ABDOMEN AND PELVIS WITH CONTRAST TECHNIQUE: Multidetector CT imaging of the abdomen and pelvis was performed using the standard protocol following bolus administration of intravenous contrast. RADIATION DOSE REDUCTION: This exam was performed according to the departmental dose-optimization program which includes automated exposure control, adjustment of the mA and/or kV according to patient size and/or use of iterative reconstruction technique. CONTRAST:  OMNIPAQUE  IOHEXOL  350 MG/ML SOLN COMPARISON:  CT abdomen and pelvis 03/02/2024. FINDINGS: Lower chest: There are scattered rounded hypodensities throughout the liver which are too small to characterize and unchanged, likely cysts or hemangiomas. The gallbladder and bile ducts are within normal limits. Hepatobiliary: There is a 1 cm hypodensity in the uncinate process of the pancreas which is unchanged. The pancreas is otherwise within normal limits. Pancreas: The spleen is mildly enlarged, unchanged. Spleen: Normal in size without focal abnormality. Adrenals/Urinary Tract: There is a 4 mm calculus in the inferior pole of the left kidney. There is no hydronephrosis in either kidney. There is bilateral perinephric fat stranding, right greater than left. Diffuse patchy areas of hypoattenuation are seen throughout the right kidney. There are mild patchy areas of hypoattenuation in the inferior pole of the left kidney. Additionally there is wall enhancement of the right renal pelvis and right ureter with right periureteral stranding the level of the pelvic inlet. There is a small amount of air in the bladder. The bladder is otherwise within normal limits. Adrenal glands appear normal. Stomach/Bowel: Stomach is within normal limits. No evidence of bowel wall thickening, distention, or inflammatory changes. There are scattered colonic diverticula. The appendix is not visualized. Vascular/Lymphatic: Aortic  atherosclerosis. No enlarged abdominal or pelvic lymph nodes. Reproductive: Uterus and bilateral adnexa are unremarkable. Other: No abdominal wall hernia or abnormality. No abdominopelvic ascites. Musculoskeletal: Multilevel degenerative changes affect the spine. IMPRESSION: 1. Findings compatible with bilateral pyelonephritis, right greater than left. 2. Nonobstructing left renal calculus. 3. Small amount of air in the bladder may be  related to recent instrumentation or infection. 4. Stable 1 cm hypodensity in the uncinate process of the pancreas. Recommend follow-up MRI in 1 year. 5. Stable splenomegaly. 6. Aortic atherosclerosis. Aortic Atherosclerosis (ICD10-I70.0). Electronically Signed   By: Greig Pique M.D.   On: 06/04/2024 16:25   CT HEAD WO CONTRAST ( ) Result Date: 06/04/2024 CLINICAL DATA:  Nausea and vomiting for 2 days EXAM: CT HEAD WITHOUT CONTRAST TECHNIQUE: Contiguous axial images were obtained from the base of the skull through the vertex without intravenous contrast. RADIATION DOSE REDUCTION: This exam was performed according to the departmental dose-optimization program which includes automated exposure control, adjustment of the mA and/or kV according to patient size and/or use of iterative reconstruction technique. COMPARISON:  11/07/2022 FINDINGS: Brain: Stable chronic small-vessel ischemic changes within the bilateral basal ganglia and periventricular white matter. No evidence of acute infarct or hemorrhage. Lateral ventricles and remaining midline structures are unremarkable. No acute extra-axial fluid collections. No mass effect. Vascular: No hyperdense vessel or unexpected calcification. Skull: Normal. Negative for fracture or focal lesion. Sinuses/Orbits: No acute finding. Other: None. IMPRESSION: 1. Stable head CT, no acute intracranial process. Electronically Signed   By: Ozell Daring M.D.   On: 06/04/2024 16:19   DG Chest 2 View Result Date: 06/04/2024 CLINICAL DATA:   Shortness of breath. EXAM: CHEST - 2 VIEW COMPARISON:  Mar 02, 2024. FINDINGS: Stable cardiomegaly. Right perihilar subsegmental atelectasis or scarring is noted. Left lung is clear. Bony thorax is unremarkable. IMPRESSION: Mild right perihilar subsegmental atelectasis or scarring is noted. Electronically Signed   By: Lynwood Landy Raddle M.D.   On: 06/04/2024 14:35    Scheduled Meds:  amiodarone   200 mg Oral Daily   aspirin  EC  81 mg Oral Daily   budesonide -glycopyrrolate -formoterol   2 puff Inhalation BID   diltiazem   180 mg Oral Daily   enoxaparin  (LOVENOX ) injection  45 mg Subcutaneous Q24H   pantoprazole   40 mg Oral Daily   roflumilast   500 mcg Oral Daily   sertraline   200 mg Oral Daily   Continuous Infusions:  cefTRIAXone  (ROCEPHIN )  IV 2 g (06/05/24 0419)     LOS: 1 day  MDM: Patient is high risk for one or more organ failure.  They necessitate ongoing hospitalization for continued IV therapies and subsequent lab monitoring. Total time spent interpreting labs and vitals, reviewing the medical record, coordinating care amongst consultants and care team members, directly assessing and discussing care with the patient and/or family: 55 min  Laree Lock, MD [facility]  To contact the attending physician between 7A-7P please use Epic Chat. To contact the covering physician during after hours 7P-7A, please review Amion.  06/05/2024, 9:16 AM   *This document has been created with the assistance of dictation software. Please excuse typographical errors. *

## 2024-06-05 NOTE — Progress Notes (Signed)
 Mobility Specialist - Progress Note   06/05/24 1549  Mobility  Activity Pivoted/transferred to/from BSC;Stood at bedside  Level of Assistance Standby assist, set-up cues, supervision of patient - no hands on  Assistive Device None  Distance Ambulated (ft) 2 ft  Activity Response Tolerated well  Mobility visit 1 Mobility  Mobility Specialist Start Time (ACUTE ONLY) 1517  Mobility Specialist Stop Time (ACUTE ONLY) 1530  Mobility Specialist Time Calculation (min) (ACUTE ONLY) 13 min   Pt sitting on the BSC upon entry, utilizing [place]. Pt STS and stood at the bedside and dons undergarments and pants with MinA for peri care. Pt transferred to bed, left semi fowler with alarm set and needs within reach.  America Silvan Mobility Specialist 06/05/24 3:51 PM

## 2024-06-05 NOTE — Progress Notes (Signed)
 PHARMACY - PHYSICIAN COMMUNICATION CRITICAL VALUE ALERT - BLOOD CULTURE IDENTIFICATION (BCID)  Results for orders placed or performed during the hospital encounter of 06/04/24  Blood culture (routine x 2)     Status: None (Preliminary result)   Collection Time: 06/04/24  3:15 PM   Specimen: BLOOD  Result Value Ref Range Status   Specimen Description BLOOD LEFT ANTECUBITAL  Final   Special Requests   Final    BOTTLES DRAWN AEROBIC AND ANAEROBIC Blood Culture results may not be optimal due to an inadequate volume of blood received in culture bottles   Culture  Setup Time   Final    Organism ID to follow GRAM NEGATIVE RODS AEROBIC BOTTLE ONLY CRITICAL RESULT CALLED TO, READ BACK BY AND VERIFIED WITHBETHA TERESIA [name] PHARMD [id number] 06/05/24 HNM Performed at [facility], [address] [place], [place] [address]    Culture GRAM NEGATIVE RODS  Final   Report Status PENDING  Incomplete  Blood Culture ID Panel (Reflexed)     Status: Abnormal   Collection Time: 06/04/24  3:15 PM  Result Value Ref Range Status   Enterococcus faecalis NOT DETECTED NOT DETECTED Final   Enterococcus Faecium NOT DETECTED NOT DETECTED Final   Listeria monocytogenes NOT DETECTED NOT DETECTED Final   Staphylococcus species NOT DETECTED NOT DETECTED Final   Staphylococcus aureus (BCID) NOT DETECTED NOT DETECTED Final   Staphylococcus epidermidis NOT DETECTED NOT DETECTED Final   Staphylococcus lugdunensis NOT DETECTED NOT DETECTED Final   Streptococcus species NOT DETECTED NOT DETECTED Final   Streptococcus agalactiae NOT DETECTED NOT DETECTED Final   Streptococcus pneumoniae NOT DETECTED NOT DETECTED Final   Streptococcus pyogenes NOT DETECTED NOT DETECTED Final   A.calcoaceticus-baumannii NOT DETECTED NOT DETECTED Final   Bacteroides fragilis NOT DETECTED NOT DETECTED Final   Enterobacterales DETECTED (A) NOT DETECTED Final    Comment: Enterobacterales represent a large order of gram negative bacteria, not  a single organism. CRITICAL RESULT CALLED TO, READ BACK BY AND VERIFIED WITH: Deyana Wnuk PHARMD [id number] 06/05/24 HNM    Enterobacter cloacae complex NOT DETECTED NOT DETECTED Final   Escherichia coli DETECTED (A) NOT DETECTED Final    Comment: CRITICAL RESULT CALLED TO, READ BACK BY AND VERIFIED WITH: Emilyann Banka PHARMD [id number] 06/05/24 HNM    Klebsiella aerogenes NOT DETECTED NOT DETECTED Final   Klebsiella oxytoca NOT DETECTED NOT DETECTED Final   Klebsiella pneumoniae NOT DETECTED NOT DETECTED Final   Proteus species NOT DETECTED NOT DETECTED Final   Salmonella species NOT DETECTED NOT DETECTED Final   Serratia marcescens NOT DETECTED NOT DETECTED Final   Haemophilus influenzae NOT DETECTED NOT DETECTED Final   Neisseria meningitidis NOT DETECTED NOT DETECTED Final   Pseudomonas aeruginosa NOT DETECTED NOT DETECTED Final   Stenotrophomonas maltophilia NOT DETECTED NOT DETECTED Final   Candida albicans NOT DETECTED NOT DETECTED Final   Candida auris NOT DETECTED NOT DETECTED Final   Candida glabrata NOT DETECTED NOT DETECTED Final   Candida krusei NOT DETECTED NOT DETECTED Final   Candida parapsilosis NOT DETECTED NOT DETECTED Final   Candida tropicalis NOT DETECTED NOT DETECTED Final   Cryptococcus neoformans/gattii NOT DETECTED NOT DETECTED Final   CTX-M ESBL NOT DETECTED NOT DETECTED Final   Carbapenem resistance IMP NOT DETECTED NOT DETECTED Final   Carbapenem resistance KPC NOT DETECTED NOT DETECTED Final   Carbapenem resistance NDM NOT DETECTED NOT DETECTED Final   Carbapenem resist OXA 48 LIKE NOT DETECTED NOT DETECTED Final   Carbapenem resistance VIM  NOT DETECTED NOT DETECTED Final    Comment: Performed at [facility], [address] [place], [place] [address]  Blood culture (routine x 2)     Status: None (Preliminary result)   Collection Time: 06/04/24  3:20 PM   Specimen: BLOOD  Result Value Ref Range Status   Specimen Description BLOOD RIGHT ARM  Final    Special Requests   Final    BOTTLES DRAWN AEROBIC AND ANAEROBIC Blood Culture results may not be optimal due to an inadequate volume of blood received in culture bottles   Culture  Setup Time   Final    GRAM NEGATIVE RODS AEROBIC BOTTLE ONLY CRITICAL VALUE NOTED.  VALUE IS CONSISTENT WITH PREVIOUSLY REPORTED AND CALLED VALUE. Performed at [facility], [address] [place], [place] [address]    Culture GRAM NEGATIVE RODS  Final   Report Status PENDING  Incomplete   *Note: Due to a large number of results and/or encounters for the requested time period, some results have not been displayed. A complete set of results can be found in Results Review.    BCID Results: 2 (both aerobic) of 4 bottles with E. Coli, no resistance.  Pt currently on Ceftriaxone  2 gm q24hr.  Name of provider contacted: Theresa Peaches, MD   Changes to prescribed antibiotics required: No changes needed at this time.  Theresa [name], PharmD, MBA 06/05/2024 4:55 AM

## 2024-06-06 LAB — MAGNESIUM: Magnesium: 2.7 mg/dL — ABNORMAL HIGH (ref 1.7–2.4)

## 2024-06-06 LAB — BASIC METABOLIC PANEL WITH GFR
Anion gap: 9 (ref 5–15)
BUN: 25 mg/dL — ABNORMAL HIGH (ref 8–23)
CO2: 23 mmol/L (ref 22–32)
Calcium: 9.7 mg/dL (ref 8.9–10.3)
Chloride: 109 mmol/L (ref 98–111)
Creatinine, Ser: 1.05 mg/dL — ABNORMAL HIGH (ref 0.44–1.00)
GFR, Estimated: 58 mL/min — ABNORMAL LOW (ref >60)
Glucose, Bld: 116 mg/dL — ABNORMAL HIGH (ref 70–99)
Potassium: 4.4 mmol/L (ref 3.5–5.1)
Sodium: 141 mmol/L (ref 135–145)

## 2024-06-06 LAB — CBC
HCT: 39.1 % (ref 36.0–46.0)
Hemoglobin: 11.7 g/dL — ABNORMAL LOW (ref 12.0–15.0)
MCH: 28.4 pg (ref 26.0–34.0)
MCHC: 29.9 g/dL — ABNORMAL LOW (ref 30.0–36.0)
MCV: 94.9 fL (ref 80.0–100.0)
Platelets: 181 K/uL (ref 150–400)
RBC: 4.12 MIL/uL (ref 3.87–5.11)
RDW: 16.5 % — ABNORMAL HIGH (ref 11.5–15.5)
WBC: 25.1 K/uL — ABNORMAL HIGH (ref 4.0–10.5)
nRBC: 0 % (ref 0.0–0.2)

## 2024-06-06 MED ORDER — PREDNISONE 20 MG PO TABS
40.0000 mg | ORAL_TABLET | Freq: Every day | ORAL | Status: DC
  Administered 2024-06-06 – 2024-06-08 (×3): 40 mg via ORAL
  Filled 2024-06-06 (×3): qty 2

## 2024-06-06 MED ORDER — TORSEMIDE 20 MG PO TABS
80.0000 mg | ORAL_TABLET | Freq: Every morning | ORAL | Status: DC
  Administered 2024-06-07: 80 mg via ORAL
  Filled 2024-06-06: qty 4

## 2024-06-06 MED ORDER — TORSEMIDE 20 MG PO TABS: 40.0000 mg | ORAL_TABLET | ORAL | Status: DC

## 2024-06-06 MED ORDER — SPIRONOLACTONE 12.5 MG HALF TABLET
12.5000 mg | ORAL_TABLET | Freq: Every day | ORAL | Status: DC
  Administered 2024-06-06 – 2024-06-09 (×4): 12.5 mg via ORAL
  Filled 2024-06-06 (×4): qty 1

## 2024-06-06 MED ORDER — TORSEMIDE 20 MG PO TABS
40.0000 mg | ORAL_TABLET | Freq: Every evening | ORAL | Status: DC
  Administered 2024-06-06 – 2024-06-07 (×2): 40 mg via ORAL
  Filled 2024-06-06 (×2): qty 2

## 2024-06-06 NOTE — Care Management Important Message (Signed)
 Important Message  Patient Details  Name: Theresa [name] MRN: [id number] Date of Birth: October 06, 1956   Important Message Given:  Yes - Medicare IM     Rojelio [name] 06/06/2024, 12:48 PM

## 2024-06-06 NOTE — Progress Notes (Signed)
 PROGRESS NOTE    Theresa [name]  FMW:[id number] DOB: 1956/03/09 DOA: 06/04/2024 PCP: Odell Tor Edra CINDERELLA, MD  Chief Complaint  Patient presents with   Shortness of Breath   Flank Pain   Emesis    Hospital Course:  Theresa [name] is a 68 y.o. female with medical history significant of COPD on 4L O2, dCHF, Afib not on anticoagulation, who presented with N/V and abdominal pain.     3 days ago, pt developed pain in her abdomen that radiated up to her neck.  Started having N/V 2 days ago, and couldn't keep even liquid down.  No diarrhea.  Denied dysuria, but reported urine smelled bad.  Continued to have good urine output with home diuretics.  Reported dyspnea but stable on home 4L O2.  No prior hx of UTI.   ED Course: initial vitals: afebrile pulse 96, BP 118/61, RR 24, sating 96% on 5L.  Labs notable for potassium 2.9, WBC 30.6, UA large Hgb, large leuk.  CTA chest no PE and no acute finding.  CT a/p showed bilateral pyelonephritis.  Pt received 2L NS, Vanc/cefe/flagyl  in the ED before admission  Subjective: Patient was examined at the bedside, States feels somewhat better than yesterday, still has right-sided flank pain. Also has wheezing b/l   Objective: Vitals:   06/05/24 2218 06/06/24 0447 06/06/24 0924 06/06/24 1536  BP: 129/79 123/78 129/71 133/74  Pulse: 77 85 92 83  Resp: 20 (!) 22 (!) 22 18  Temp:  98.1 F (36.7 C) 98.9 F (37.2 C) 98.3 F (36.8 C)  TempSrc:  Oral Oral Oral  SpO2: 98% 95% (S) 98% 99%  Weight:      Height:        Intake/Output Summary (Last 24 hours) at 06/06/2024 1719 Last data filed at 06/06/2024 1427 Gross per 24 hour  Intake 620 ml  Output 200 ml  Net 420 ml   Filed Weights   06/04/24 1356  Weight: 92.5 kg    Examination: Constitutional: NAD, AAOx3 HEENT: conjunctivae and lids normal, EOMI CV: No cyanosis.   RESP: normal respiratory effort, b/l wheezing +, on 4L Abd: Soft, distended due to body habitus, R CVA TTP > Left  side Extremities: No effusions, edema in BLE SKIN: warm, dry Neuro: II - XII grossly intact.   Psych: Normal mood and affect.  Appropriate judgement and reason  Assessment & Plan:  Principal Problem:   Acute pyelonephritis  E.coli bacteremia Acute bilateral pyelonephritis --started on Vanc/cefe/flagyl  - Bcx Enterobacterales, E.coli, pending sensitivitues - Ucx insignificant growth - CT abd bilateral pyelonephritis, right > left, Small amount of air in the bladder may be related to recent instrumentation/infection - On IV ceftriaxone    Sepsis --tachycardia, leukocytosis improving (on steroids), source pyelo - LA normal   Acute COPD exacerbation Chronic hypoxemic respiratory failure --stable.  Some increased dyspnea likely due to sepsis, RVP negative --cont home 4L O2 --cont home bronchodilators, add prednisone  40mg  x 5 days   Hx of Afib --not on anticoagulation due to hx of GI bleed --cont home amiodarone  and cardizem    AKI - improving --Cr 1.15 -> 1.05.  Received 2L NS in the ED. --hold further IVF due to hx of CHF --Resume home torsemide  and aldactone    Chronic dCHF -- Resume home torsemide  and aldactone    Hypokalemia --monitor and supplement PRN - Mag normal   Chronic pain on chronic opioids --increase home Norco frequency due to acute abdominal pain from Pyelo  Prolonged Qtc -- change  Zofran  to Compazine  -- Monitor Qtc  Incidental findings: Nonobstructing left renal calculus. Stable 1 cm hypodensity in the uncinate process of the pancreas -recommend MRI follow-up in 1 year  DVT prophylaxis: Lovenox  SQ   Code Status: Full Code Disposition:  Home  Consultants:  None  Procedures:  None  Antimicrobials:  Anti-infectives (From admission, onward)    Start     Dose/Rate Route Frequency Ordered Stop   06/05/24 0400  cefTRIAXone  (ROCEPHIN ) 2 g in sodium chloride  0.9 % 100 mL IVPB        2 g 200 mL/hr over 30 Minutes Intravenous Every 24 hours 06/04/24  1842     06/04/24 1500  ceFEPIme  (MAXIPIME ) 2 g in sodium chloride  0.9 % 100 mL IVPB        2 g 200 mL/hr over 30 Minutes Intravenous  Once 06/04/24 1451 06/04/24 1630   06/04/24 1500  metroNIDAZOLE  (FLAGYL ) IVPB 500 mg        500 mg 100 mL/hr over 60 Minutes Intravenous  Once 06/04/24 1451 06/04/24 1808   06/04/24 1500  vancomycin  (VANCOCIN ) IVPB 1000 mg/200 mL premix  Status:  Discontinued        1,000 mg 200 mL/hr over 60 Minutes Intravenous  Once 06/04/24 1451 06/04/24 1451   06/04/24 1500  vancomycin  (VANCOREADY) IVPB 2000 mg/400 mL  Status:  Discontinued        2,000 mg 200 mL/hr over 120 Minutes Intravenous  Once 06/04/24 1452 06/04/24 1800       Data Reviewed: I have personally reviewed following labs and imaging studies CBC: Recent Labs  Lab 06/04/24 1358 06/05/24 0215 06/06/24 0523  WBC 30.6* 42.7* 25.1*  HGB 12.4 11.5* 11.7*  HCT 38.2 36.9 39.1  MCV 89.9 92.5 94.9  PLT 152 136* 181   Basic Metabolic Panel: Recent Labs  Lab 06/04/24 1358 06/04/24 1532 06/05/24 0215 06/05/24 1726 06/06/24 0523  NA 133*  --  136  --  141  K 2.9*  --  2.9* 4.5 4.4  CL 96*  --  103  --  109  CO2 24  --  23  --  23  GLUCOSE 118*  --  180*  --  116*  BUN 18  --  21  --  25*  CREATININE 1.15*  --  1.13*  --  1.05*  CALCIUM  9.1  --  9.0  --  9.7  MG  --  2.2 2.4  --  2.7*   GFR: Estimated Creatinine Clearance: 55.1 mL/min (A) (by C-G formula based on SCr of 1.05 mg/dL (H)). Liver Function Tests: Recent Labs  Lab 06/04/24 1358  AST 29  ALT 38  ALKPHOS 109  BILITOT 0.9  PROT 7.0  ALBUMIN 3.4*   CBG: No results for input(s): GLUCAP in the last 168 hours.  Recent Results (from the past 240 hours)  Urine Culture     Status: Abnormal   Collection Time: 06/04/24  2:45 PM   Specimen: Urine, Random  Result Value Ref Range Status   Specimen Description   Final    URINE, RANDOM Performed at [facility], [address] [place], [place] [address]    Special  Requests   Final    NONE Reflexed from [id number] Performed at [facility], [address] [place], [place] [address]    Culture (A)  Final    <10,000 COLONIES/mL INSIGNIFICANT GROWTH Performed at [facility], [address] [place], [place] [address]  Report Status 06/05/2024 FINAL  Final  Blood culture (routine x 2)     Status: Abnormal (Preliminary result)   Collection Time: 06/04/24  3:15 PM   Specimen: BLOOD  Result Value Ref Range Status   Specimen Description   Final    BLOOD LEFT ANTECUBITAL Performed at [facility], [address] [place], [place] [address]    Special Requests   Final    BOTTLES DRAWN AEROBIC AND ANAEROBIC Blood Culture results may not be optimal due to an inadequate volume of blood received in culture bottles Performed at [facility], [address] [place], [place] [address]    Culture  Setup Time   Final    GRAM NEGATIVE RODS AEROBIC BOTTLE ONLY CRITICAL RESULT CALLED TO, READ BACK BY AND VERIFIED WITH: HATHAN BELUE PHARMD [id number] 06/05/24 HNM    Culture (A)  Final    ESCHERICHIA COLI SUSCEPTIBILITIES TO FOLLOW Performed at [facility], [address] [place], [place] [address]    Report Status PENDING  Incomplete  Blood Culture ID Panel (Reflexed)     Status: Abnormal   Collection Time: 06/04/24  3:15 PM  Result Value Ref Range Status   Enterococcus faecalis NOT DETECTED NOT DETECTED Final   Enterococcus Faecium NOT DETECTED NOT DETECTED Final   Listeria monocytogenes NOT DETECTED NOT DETECTED Final   Staphylococcus species NOT DETECTED NOT DETECTED Final   Staphylococcus aureus (BCID) NOT DETECTED NOT DETECTED Final   Staphylococcus epidermidis NOT DETECTED NOT DETECTED Final   Staphylococcus lugdunensis NOT DETECTED NOT DETECTED Final   Streptococcus species NOT DETECTED NOT DETECTED Final   Streptococcus agalactiae NOT DETECTED NOT DETECTED Final   Streptococcus pneumoniae NOT DETECTED NOT  DETECTED Final   Streptococcus pyogenes NOT DETECTED NOT DETECTED Final   A.calcoaceticus-baumannii NOT DETECTED NOT DETECTED Final   Bacteroides fragilis NOT DETECTED NOT DETECTED Final   Enterobacterales DETECTED (A) NOT DETECTED Final    Comment: Enterobacterales represent a large order of gram negative bacteria, not a single organism. CRITICAL RESULT CALLED TO, READ BACK BY AND VERIFIED WITH: NATHAN BELUE PHARMD [id number] 06/05/24 HNM    Enterobacter cloacae complex NOT DETECTED NOT DETECTED Final   Escherichia coli DETECTED (A) NOT DETECTED Final    Comment: CRITICAL RESULT CALLED TO, READ BACK BY AND VERIFIED WITH: NATHAN BELUE PHARMD [id number] 06/05/24 HNM    Klebsiella aerogenes NOT DETECTED NOT DETECTED Final   Klebsiella oxytoca NOT DETECTED NOT DETECTED Final   Klebsiella pneumoniae NOT DETECTED NOT DETECTED Final   Proteus species NOT DETECTED NOT DETECTED Final   Salmonella species NOT DETECTED NOT DETECTED Final   Serratia marcescens NOT DETECTED NOT DETECTED Final   Haemophilus influenzae NOT DETECTED NOT DETECTED Final   Neisseria meningitidis NOT DETECTED NOT DETECTED Final   Pseudomonas aeruginosa NOT DETECTED NOT DETECTED Final   Stenotrophomonas maltophilia NOT DETECTED NOT DETECTED Final   Candida albicans NOT DETECTED NOT DETECTED Final   Candida auris NOT DETECTED NOT DETECTED Final   Candida glabrata NOT DETECTED NOT DETECTED Final   Candida krusei NOT DETECTED NOT DETECTED Final   Candida parapsilosis NOT DETECTED NOT DETECTED Final   Candida tropicalis NOT DETECTED NOT DETECTED Final   Cryptococcus neoformans/gattii NOT DETECTED NOT DETECTED Final   CTX-M ESBL NOT DETECTED NOT DETECTED Final   Carbapenem resistance IMP NOT DETECTED NOT DETECTED Final   Carbapenem resistance KPC NOT DETECTED NOT DETECTED Final   Carbapenem resistance NDM NOT DETECTED NOT DETECTED Final  Carbapenem resist OXA 48 LIKE NOT DETECTED NOT DETECTED Final   Carbapenem resistance VIM NOT  DETECTED NOT DETECTED Final    Comment: Performed at [facility], [address] [place], [place] [address]  Blood culture (routine x 2)     Status: None (Preliminary result)   Collection Time: 06/04/24  3:20 PM   Specimen: BLOOD  Result Value Ref Range Status   Specimen Description   Final    BLOOD RIGHT ARM Performed at [facility], [address], [place], [place] [address]    Special Requests   Final    BOTTLES DRAWN AEROBIC AND ANAEROBIC Blood Culture results may not be optimal due to an inadequate volume of blood received in culture bottles Performed at [facility], [address], [place], [place] [address]    Culture  Setup Time   Final    GRAM NEGATIVE RODS AEROBIC BOTTLE ONLY CRITICAL VALUE NOTED.  VALUE IS CONSISTENT WITH PREVIOUSLY REPORTED AND CALLED VALUE. Performed at [facility], [address] [place], [place] [address]    Culture   Final    VONNE NEGATIVE RODS IDENTIFICATION TO FOLLOW Performed at [facility], [address], [place], [place] [address]    Report Status PENDING  Incomplete  Respiratory (~20 pathogens) panel by PCR     Status: None   Collection Time: 06/05/24  4:51 PM   Specimen: Nasopharyngeal Swab; Respiratory  Result Value Ref Range Status   Adenovirus NOT DETECTED NOT DETECTED Final   Coronavirus 229E NOT DETECTED NOT DETECTED Final    Comment: (NOTE) The Coronavirus on the Respiratory Panel, DOES NOT test for the novel  Coronavirus (2019 nCoV)    Coronavirus HKU1 NOT DETECTED NOT DETECTED Final   Coronavirus NL63 NOT DETECTED NOT DETECTED Final   Coronavirus OC43 NOT DETECTED NOT DETECTED Final   Metapneumovirus NOT DETECTED NOT DETECTED Final   Rhinovirus / Enterovirus NOT DETECTED NOT DETECTED Final   Influenza A NOT DETECTED NOT DETECTED Final   Influenza B NOT DETECTED NOT DETECTED Final   Parainfluenza Virus 1 NOT DETECTED NOT DETECTED Final   Parainfluenza Virus 2 NOT DETECTED  NOT DETECTED Final   Parainfluenza Virus 3 NOT DETECTED NOT DETECTED Final   Parainfluenza Virus 4 NOT DETECTED NOT DETECTED Final   Respiratory Syncytial Virus NOT DETECTED NOT DETECTED Final   Bordetella pertussis NOT DETECTED NOT DETECTED Final   Bordetella Parapertussis NOT DETECTED NOT DETECTED Final   Chlamydophila pneumoniae NOT DETECTED NOT DETECTED Final   Mycoplasma pneumoniae NOT DETECTED NOT DETECTED Final    Comment: Performed at [facility], [address], [place], [place] [address]     Radiology Studies: No results found.   Scheduled Meds:  amiodarone   200 mg Oral Daily   aspirin  EC  81 mg Oral Daily   budesonide -glycopyrrolate -formoterol   2 puff Inhalation BID   diltiazem   180 mg Oral Daily   enoxaparin  (LOVENOX ) injection  45 mg Subcutaneous Q24H   pantoprazole   40 mg Oral Daily   predniSONE   40 mg Oral Q breakfast   roflumilast   500 mcg Oral Daily   sertraline   200 mg Oral Daily   Continuous Infusions:  cefTRIAXone  (ROCEPHIN )  IV Stopped (06/06/24 0514)     LOS: 2 days  MDM: Patient is high risk for one or more organ failure.  They necessitate ongoing hospitalization for continued IV therapies and subsequent lab monitoring. Total time spent interpreting labs and vitals, reviewing  the medical record, coordinating care amongst consultants and care team members, directly assessing and discussing care with the patient and/or family: 55 min  Laree Lock, MD [facility]  To contact the attending physician between 7A-7P please use Epic Chat. To contact the covering physician during after hours 7P-7A, please review Amion.  06/06/2024, 5:19 PM   *This document has been created with the assistance of dictation software. Please excuse typographical errors. *

## 2024-06-07 LAB — BASIC METABOLIC PANEL WITH GFR
Anion gap: 10 (ref 5–15)
BUN: 21 mg/dL (ref 8–23)
CO2: 25 mmol/L (ref 22–32)
Calcium: 9.7 mg/dL (ref 8.9–10.3)
Chloride: 105 mmol/L (ref 98–111)
Creatinine, Ser: 0.78 mg/dL (ref 0.44–1.00)
GFR, Estimated: >60 mL/min (ref >60)
Glucose, Bld: 135 mg/dL — ABNORMAL HIGH (ref 70–99)
Potassium: 4.5 mmol/L (ref 3.5–5.1)
Sodium: 140 mmol/L (ref 135–145)

## 2024-06-07 LAB — CULTURE, BLOOD (ROUTINE X 2)

## 2024-06-07 LAB — CBC
HCT: 37.6 % (ref 36.0–46.0)
Hemoglobin: 11.3 g/dL — ABNORMAL LOW (ref 12.0–15.0)
MCH: 28.6 pg (ref 26.0–34.0)
MCHC: 30.1 g/dL (ref 30.0–36.0)
MCV: 95.2 fL (ref 80.0–100.0)
Platelets: 232 K/uL (ref 150–400)
RBC: 3.95 MIL/uL (ref 3.87–5.11)
RDW: 16.8 % — ABNORMAL HIGH (ref 11.5–15.5)
WBC: 14.9 K/uL — ABNORMAL HIGH (ref 4.0–10.5)
nRBC: 0 % (ref 0.0–0.2)

## 2024-06-07 LAB — MAGNESIUM: Magnesium: 2.2 mg/dL (ref 1.7–2.4)

## 2024-06-07 MED ORDER — GUAIFENESIN 100 MG/5ML PO LIQD
5.0000 mL | ORAL | Status: DC | PRN
  Administered 2024-06-07 – 2024-06-09 (×3): 5 mL via ORAL
  Filled 2024-06-07 (×3): qty 10

## 2024-06-07 NOTE — Plan of Care (Signed)
  Problem: Education: Goal: Knowledge of General Education information will improve Description: Including pain rating scale, medication(s)/side effects and non-pharmacologic comfort measures Outcome: Progressing   Problem: Health Behavior/Discharge Planning: Goal: Ability to manage health-related needs will improve Outcome: Progressing   Problem: Clinical Measurements: Goal: Respiratory complications will improve Outcome: Not Progressing

## 2024-06-07 NOTE — Plan of Care (Signed)

## 2024-06-07 NOTE — TOC Initial Note (Signed)
 Transition of Care [facility]) - Initial/Assessment Note    Patient Details  Name: Theresa [name] MRN: [id number] Date of Birth: [birth year]  Transition of Care [facility]) CM/SW Contact:    Lauraine JAYSON Carpen, LCSW Phone Number: 06/07/2024, 12:31 PM  Clinical Narrative:  Readmission prevention screen complete. CSW met with patient. No family at bedside. CSW introduced role and explained that discharge planning would be discussed. PCP is Consuelo Odell Chard, MD. Husband drives her to appointments. Pharmacy is [facility]. No issues obtaining medications. Patient lives home with her husband. No home health prior to admission. She has a rollator, shower chair, and ramp at home. She uses 5 L chronic oxygen  through [employer]. No further concerns. CSW will continue to follow patient for support and facilitate return home once stable. Husband will transport her home at discharge.                Expected Discharge Plan: Home/Self Care Barriers to Discharge: Continued Medical Work up   Patient Goals and CMS Choice            Expected Discharge Plan and Services     Post Acute Care Choice: NA Living arrangements for the past 2 months: Single Family Home                                      Prior Living Arrangements/Services Living arrangements for the past 2 months: Single Family Home Lives with:: Spouse Patient language and need for interpreter reviewed:: Yes Do you feel safe going back to the place where you live?: Yes      Need for Family Participation in Patient Care: Yes (Comment) Care giver support system in place?: Yes (comment) Current home services: DME Criminal Activity/Legal Involvement Pertinent to Current Situation/Hospitalization: No - Comment as needed  Activities of Daily Living   ADL Screening (condition at time of admission) Independently performs ADLs?: No Does the patient have a NEW difficulty with bathing/dressing/toileting/self-feeding that is expected to last >3  days?: Yes (Initiates electronic notice to provider for possible OT consult) Does the patient have a NEW difficulty with getting in/out of bed, walking, or climbing stairs that is expected to last >3 days?: Yes (Initiates electronic notice to provider for possible PT consult) Does the patient have a NEW difficulty with communication that is expected to last >3 days?: No Is the patient deaf or have difficulty hearing?: No Does the patient have difficulty seeing, even when wearing glasses/contacts?: No Does the patient have difficulty concentrating, remembering, or making decisions?: No  Permission Sought/Granted                  Emotional Assessment Appearance:: Appears stated age Attitude/Demeanor/Rapport: Engaged, Gracious Affect (typically observed): Accepting, Appropriate, Calm, Pleasant Orientation: : Oriented to Self, Oriented to Place, Oriented to  Time, Oriented to Situation Alcohol / Substance Use: Not Applicable Psych Involvement: No (comment)  Admission diagnosis:  Acute pyelonephritis [N10] Sepsis, due to unspecified organism, unspecified whether acute organ dysfunction present [facility]) [A41.9] Patient Active Problem List   Diagnosis Date Noted   Acute pyelonephritis 06/04/2024   Acute on chronic low back pain 05/13/2024   Aortic atherosclerosis (HCC) 04/16/2024   Oral thrush 04/16/2024   Panlobular emphysema (HCC) 04/16/2024   Recurrent major depressive disorder, in partial remission (HCC) 04/16/2024   Tinea pedis of both feet 04/16/2024   Hypertensive heart disease with heart failure (HCC) 04/16/2024  Tobacco dependence 04/16/2024   Osteoarthritis of hip (Right) 04/16/2024   Cellulitis of leg, left 04/16/2024   Impaired range of motion of right shoulder 04/10/2024   Impaired range of motion of right hip 04/10/2024   Paroxysmal atrial fibrillation (HCC) 03/06/2024   Class II obesity 03/06/2024   Polyp of colon 03/05/2024   Acute on chronic blood loss anemia  03/04/2024   Iron  deficiency anemia 03/03/2024   GI bleed 03/02/2024   Nondependent opioid abuse (HCC) 07/21/2023   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   Chronic hip pain (Right) 06/22/2023   Chronic groin pain (Right) 06/22/2023   Buttock pain (Right) 06/22/2023   Greater trochanteric bursitis (Right) 06/22/2023   Lumbar facet joint pain 02/21/2023   Grade 1 Anterolisthesis of lumbar spine (4mm) (L3/L4) 02/21/2023   Grade 1 Retrolisthesis (2mm) (L5/S1) 02/21/2023   Colon cancer screening 02/14/2023   Trigger point with back pain 12/19/2022   Lumbar trigger point syndrome 12/19/2022   ABLA (acute blood loss anemia) 11/05/2022   Epistaxis 11/05/2022   Medication adverse effect, initial encounter 11/01/2022   Epistaxis, recurrent 11/01/2022   Adverse effects of medication, initial encounter 11/01/2022   Iron  deficiency anemia due to chronic blood loss 10/06/2022   History of hemoptysis 09/27/2022   History of RSV infection 09/27/2022   H/O: upper GI bleed 08/26/2022   Shortness of breath 08/09/2022   Atrial fibrillation with rapid ventricular response (HCC) 07/19/2022   Duodenal ulcer disease 06/27/2022   Coronary artery disease without angina pectoris 06/26/2022   Gastroesophageal reflux disease without esophagitis 06/26/2022   Severe tricuspid regurgitation 06/26/2022   Paroxysmal atrial flutter (HCC) 04/26/2022   Mixed hyperlipidemia 04/25/2022   Abnormal MRI, cervical spine 04/11/2022   Obesity (BMI 30-39.9) 04/02/2022   Pulmonary hypertension (HCC) 04/01/2022   Depression with anxiety    COPD with acute exacerbation (HCC) 08/06/2021   Leukocytosis 08/06/2021   Chronic shoulder pain (Bilateral) 05/05/2021   Chronic use of opiate for therapeutic purpose 01/26/2021   Pharmacologic therapy 04/08/2020   Lumbar disc extrusion (L2-3) (Left) 09/10/2019   Chronic knee pain (Left) 08/14/2019   Chronic left-sided lumbar radiculopathy 08/14/2019   Abnormal MRI, lumbar  spine (12/09/2021) 08/14/2019   Cervicalgia (Bilateral) (R>L) 09/24/2018   Spondylosis without myelopathy or radiculopathy, lumbosacral region 09/24/2018   Chronic lower extremity pain (Bilateral) (R>L) 01/25/2018   Chronic hip pain (3ry area of Pain) (Bilateral) (L>R) 11/27/2017   COPD with hypoxia (HCC) 06/27/2017   Arthralgia of acromioclavicular joint (Right) 06/15/2017   Acromioclavicular joint DJD (Right) 06/15/2017   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   Chronic shoulder pain (Left) 05/11/2017   Lumbar facet hypertrophy (L3-4, L5-S1) (Bilateral) 11/17/2016   Lumbar spinal stenosis (L4-5) 11/17/2016   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) 11/17/2016   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) 11/17/2016   Lumbosacral radiculopathy at L4 11/17/2016   Oxygen  dependent 08/01/2016   Obstructive sleep apnea syndrome 06/02/2016   Lumbar facet syndrome (Bilateral) (R>L) 01/12/2016   Chronic sacroiliac joint pain (Bilateral) 01/12/2016   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) 12/28/2015   Acute on chronic respiratory failure with hypoxia and hypercapnia (HCC) 11/03/2015   Coagulation disorder (HCC) 10/22/2015   Chronic pain syndrome (significant psychosocial component) 09/21/2015   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 09/21/2015   Long term (current) use of opiate analgesic 09/16/2015   Long term prescription opiate use 09/16/2015   Encounter for therapeutic drug level monitoring 09/16/2015   Encounter for chronic pain management  09/16/2015   Neurogenic pain 09/16/2015   Neuropathic pain 09/16/2015   Musculoskeletal pain 09/16/2015   Lumbar spondylosis (L4-5) 09/16/2015   Chronic low back pain (1ry area of Pain) (Bilateral) (R>L) w/o sciatica 09/16/2015   Chronic lower extremity pain (2ry area of Pain) (Left) 09/16/2015   Chronic lumbar radicular pain (L4 & S1 Dermatome) (Left) 09/16/2015   Chronic shoulder pain (Right)  09/16/2015   Chronic upper extremity pain (Right-sided) 09/16/2015   Cervical radiculitis (Right side) 09/16/2015   Abnormal x-ray of lumbar spine 09/16/2015   Chronic heart failure with preserved ejection fraction (HCC) 07/15/2015   Chronic hypoxic respiratory failure (HCC) 04/28/2015   Carpal tunnel syndrome 07/16/2014   Anxiety state 07/16/2014   Neuritis or radiculitis due to rupture of lumbar intervertebral disc 07/02/2014   DDD (degenerative disc disease), lumbar 07/02/2014   COPD (chronic obstructive pulmonary disease) (HCC) 02/27/2014   COPD, severe (HCC) 02/27/2014   Hypertension 03/27/2008   PCP:  Odell Tor Edra CINDERELLA, MD Pharmacy:   [facility] [place] [place] - [address] [place] [place] [address] Phone: [phone] Fax: [phone]     Social Drivers of Health (SDOH) Social History: SDOH Screenings   Food Insecurity: No Food Insecurity (06/05/2024)  Housing: Low Risk  (06/05/2024)  Transportation Needs: No Transportation Needs (06/05/2024)  Utilities: Not At Risk (06/05/2024)  Alcohol Screen: Low Risk  (09/02/2022)  Depression (PHQ2-9): Low Risk  (05/13/2024)  Recent Concern: Depression (PHQ2-9) - High Risk (04/10/2024)  Financial Resource Strain: Medium Risk (02/26/2024)   [facility]  Physical Activity: Inactive (08/24/2022)   Received from [facility]  Social Connections: Moderately Integrated (06/05/2024)  Stress: Stress Concern Present (08/24/2022)   Received from [facility]  Tobacco Use: Medium Risk (06/04/2024)   SDOH Interventions:     Readmission Risk Interventions    06/07/2024   12:30 PM 06/21/2022   12:54 PM  Readmission Risk Prevention Plan  Transportation Screening Complete Complete  PCP or Specialist Appt within 3-5 Days Complete   HRI or Home Care Consult Complete   Social Work Consult for Recovery Care Planning/Counseling Complete   Palliative Care  Screening Not Applicable   Medication Review Oceanographer) Complete Complete  PCP or Specialist appointment within 3-5 days of discharge  Complete  SW Recovery Care/Counseling Consult  Complete  Palliative Care Screening  Not Applicable  Skilled Nursing Facility  Not Applicable

## 2024-06-07 NOTE — Progress Notes (Signed)
 Patient was requested purewick for bedtime. Patient was given torsemide  at 2200. Notified NP Jesus for order.

## 2024-06-07 NOTE — Progress Notes (Signed)
 Patient is requesting a pure wick while she tries to sleep tonight. She is receiving demadex  and voids very frequently. She gets short of breath with exertion. She did sit up in the chair most of the day and used the [facility]. Order received from Dr Ponnala for the patient to use a pure wick tonight

## 2024-06-07 NOTE — Progress Notes (Addendum)
 PROGRESS NOTE    Theresa GOGUEN  FMW:[id number] DOB: June 21, 1956 DOA: 06/04/2024 PCP: Odell Tor Edra CINDERELLA, MD  Chief Complaint  Patient presents with   Shortness of Breath   Flank Pain   Emesis    Hospital Course:  Theresa [name] is a 68 y.o. female with medical history significant of COPD on 4L O2, dCHF, Afib not on anticoagulation, who presented with N/V and abdominal pain.     3 days ago, pt developed pain in her abdomen that radiated up to her neck.  Started having N/V 2 days ago, and couldn't keep even liquid down.  No diarrhea.  Denied dysuria, but reported urine smelled bad.  Continued to have good urine output with home diuretics.  Reported dyspnea but stable on home 4L O2.  No prior hx of UTI.   ED Course: initial vitals: afebrile pulse 96, BP 118/61, RR 24, sating 96% on 5L.  Labs notable for potassium 2.9, WBC 30.6, UA large Hgb, large leuk.  CTA chest no PE and no acute finding.  CT a/p showed bilateral pyelonephritis.  Pt received 2L NS, Vanc/cefe/flagyl  in the ED before admission  Subjective: Patient was examined at the bedside, Reports B/l flank pain with no improvement, L > R now Will continue IV antibiotics and monitor   Objective: Vitals:   06/06/24 2036 06/07/24 0325 06/07/24 0811 06/07/24 1429  BP: (!) 147/74 125/73 126/72 117/72  Pulse: 96 89 87 87  Resp: (!) 24 20 17 20   Temp: 98.5 F (36.9 C) 98.9 F (37.2 C) 98.8 F (37.1 C) 98.4 F (36.9 C)  TempSrc: Oral Oral Oral Oral  SpO2: 92% 97% 96% 94%  Weight:      Height:        Intake/Output Summary (Last 24 hours) at 06/07/2024 1808 Last data filed at 06/07/2024 1400 Gross per 24 hour  Intake 280 ml  Output 5225 ml  Net -4945 ml   Filed Weights   06/04/24 1356  Weight: 92.5 kg    Examination: Constitutional: NAD, AAOx3 HEENT: conjunctivae and lids normal, EOMI CV: No cyanosis.   RESP: normal respiratory effort, b/l wheezing +, on 4L Abd: Soft, distended due to body habitus, L CVA TTP >  R Extremities: No effusions, edema in BLE SKIN: warm, dry Neuro: II - XII grossly intact.   Psych: Normal mood and affect.  Appropriate judgement and reason  Assessment & Plan:  Principal Problem:   Acute pyelonephritis  E.coli bacteremia Acute bilateral pyelonephritis --started on Vanc/cefe/flagyl  - Bcx Enterobacterales, E.coli, pansensitive - Ucx insignificant growth - CT abd bilateral pyelonephritis, right > left, Small amount of air in the bladder may be related to recent instrumentation/infection - On IV ceftriaxone , will discharge on cephalexin  when clinically improving   Sepsis --tachycardia, leukocytosis improving 14.9 (on steroids), source pyelo - LA normal   Acute COPD exacerbation Chronic hypoxemic respiratory failure --stable.  Some increased dyspnea likely due to sepsis, RVP negative --cont home 4L O2 --cont home bronchodilators, add prednisone  40mg  x 5 days --Duonebs prn   Hx of Afib --not on anticoagulation due to hx of GI bleed --cont home amiodarone  and cardizem    AKI - resolved --Cr 1.15 -> 0.78.  Received 2L NS in the ED. --Resume home torsemide  and aldactone    Chronic dCHF -- Resume home torsemide  and aldactone    Hypokalemia - resolved - Mag normal  Mild hyponatremia - resolved - s/p Iv fluids on presentation   Chronic pain on chronic opioids --increase home Norco  frequency due to acute abdominal pain from Pyelo  Prolonged Qtc -- change Zofran  to Compazine  -- Monitor Qtc  Obesity Class II - BMI 37.3 - lifestyle modification and weight loss counselling  Incidental findings: Nonobstructing left renal calculus. Stable 1 cm hypodensity in the uncinate process of the pancreas -recommend MRI follow-up in 1 year  DVT prophylaxis: Lovenox  SQ   Code Status: Full Code Disposition:  Home  Consultants:  None  Procedures:  None  Antimicrobials:  Anti-infectives (From admission, onward)    Start     Dose/Rate Route Frequency Ordered Stop    06/05/24 0400  cefTRIAXone  (ROCEPHIN ) 2 g in sodium chloride  0.9 % 100 mL IVPB        2 g 200 mL/hr over 30 Minutes Intravenous Every 24 hours 06/04/24 1842     06/04/24 1500  ceFEPIme  (MAXIPIME ) 2 g in sodium chloride  0.9 % 100 mL IVPB        2 g 200 mL/hr over 30 Minutes Intravenous  Once 06/04/24 1451 06/04/24 1630   06/04/24 1500  metroNIDAZOLE  (FLAGYL ) IVPB 500 mg        500 mg 100 mL/hr over 60 Minutes Intravenous  Once 06/04/24 1451 06/04/24 1808   06/04/24 1500  vancomycin  (VANCOCIN ) IVPB 1000 mg/200 mL premix  Status:  Discontinued        1,000 mg 200 mL/hr over 60 Minutes Intravenous  Once 06/04/24 1451 06/04/24 1451   06/04/24 1500  vancomycin  (VANCOREADY) IVPB 2000 mg/400 mL  Status:  Discontinued        2,000 mg 200 mL/hr over 120 Minutes Intravenous  Once 06/04/24 1452 06/04/24 1800       Data Reviewed: I have personally reviewed following labs and imaging studies CBC: Recent Labs  Lab 06/04/24 1358 06/05/24 0215 06/06/24 0523 06/07/24 0331  WBC 30.6* 42.7* 25.1* 14.9*  HGB 12.4 11.5* 11.7* 11.3*  HCT 38.2 36.9 39.1 37.6  MCV 89.9 92.5 94.9 95.2  PLT 152 136* 181 232   Basic Metabolic Panel: Recent Labs  Lab 06/04/24 1358 06/04/24 1532 06/05/24 0215 06/05/24 1726 06/06/24 0523 06/07/24 0331  NA 133*  --  136  --  141 140  K 2.9*  --  2.9* 4.5 4.4 4.5  CL 96*  --  103  --  109 105  CO2 24  --  23  --  23 25  GLUCOSE 118*  --  180*  --  116* 135*  BUN 18  --  21  --  25* 21  CREATININE 1.15*  --  1.13*  --  1.05* 0.78  CALCIUM  9.1  --  9.0  --  9.7 9.7  MG  --  2.2 2.4  --  2.7* 2.2   GFR: Estimated Creatinine Clearance: 72.3 mL/min (by C-G formula based on SCr of 0.78 mg/dL). Liver Function Tests: Recent Labs  Lab 06/04/24 1358  AST 29  ALT 38  ALKPHOS 109  BILITOT 0.9  PROT 7.0  ALBUMIN 3.4*   CBG: No results for input(s): GLUCAP in the last 168 hours.  Recent Results (from the past 240 hours)  Urine Culture     Status: Abnormal    Collection Time: 06/04/24  2:45 PM   Specimen: Urine, Random  Result Value Ref Range Status   Specimen Description   Final    URINE, RANDOM Performed at [facility], [address], [place], [place] [address]    Special Requests   Final    NONE Reflexed from  [id number] Performed at [facility], [address], [place], [place] [address]    Culture (A)  Final    <10,000 COLONIES/mL INSIGNIFICANT GROWTH Performed at [facility], [address], [place], [place] [address]    Report Status 06/05/2024 FINAL  Final  Blood culture (routine x 2)     Status: Abnormal   Collection Time: 06/04/24  3:15 PM   Specimen: BLOOD  Result Value Ref Range Status   Specimen Description   Final    BLOOD LEFT ANTECUBITAL Performed at [facility], [address] [place], [place] [address]    Special Requests   Final    BOTTLES DRAWN AEROBIC AND ANAEROBIC Blood Culture results may not be optimal due to an inadequate volume of blood received in culture bottles Performed at [facility], [address] [place], [place] [address]    Culture  Setup Time   Final    GRAM NEGATIVE RODS AEROBIC BOTTLE ONLY CRITICAL RESULT CALLED TO, READ BACK BY AND VERIFIED WITHBETHA TERESIA DILLS PHARMD [id number] 06/05/24 HNM Performed at [facility], [address] [place], [place] [address]    Culture ESCHERICHIA COLI (A)  Final   Report Status 06/07/2024 FINAL  Final   Organism ID, Bacteria ESCHERICHIA COLI  Final      Susceptibility   Escherichia coli - MIC*    AMPICILLIN <=2 SENSITIVE Sensitive     CEFAZOLIN  (NON-URINE) <=1 SENSITIVE Sensitive     CEFEPIME  <=0.12 SENSITIVE Sensitive     ERTAPENEM <=0.12 SENSITIVE Sensitive     CEFTRIAXONE  <=0.25 SENSITIVE Sensitive     CIPROFLOXACIN  <=0.06 SENSITIVE Sensitive     GENTAMICIN <=1 SENSITIVE Sensitive     MEROPENEM <=0.25 SENSITIVE Sensitive     TRIMETH /SULFA  <=20 SENSITIVE Sensitive     AMPICILLIN/SULBACTAM <=2  SENSITIVE Sensitive     PIP/TAZO Value in next row Sensitive ug/mL     <=4 SENSITIVEThis is a modified FDA-approved test that has been validated and its performance characteristics determined by the reporting laboratory.  This laboratory is certified under the Clinical Laboratory Improvement Amendments CLIA as qualified to perform high complexity clinical laboratory testing.    * ESCHERICHIA COLI  Blood Culture ID Panel (Reflexed)     Status: Abnormal   Collection Time: 06/04/24  3:15 PM  Result Value Ref Range Status   Enterococcus faecalis NOT DETECTED NOT DETECTED Final   Enterococcus Faecium NOT DETECTED NOT DETECTED Final   Listeria monocytogenes NOT DETECTED NOT DETECTED Final   Staphylococcus species NOT DETECTED NOT DETECTED Final   Staphylococcus aureus (BCID) NOT DETECTED NOT DETECTED Final   Staphylococcus epidermidis NOT DETECTED NOT DETECTED Final   Staphylococcus lugdunensis NOT DETECTED NOT DETECTED Final   Streptococcus species NOT DETECTED NOT DETECTED Final   Streptococcus agalactiae NOT DETECTED NOT DETECTED Final   Streptococcus pneumoniae NOT DETECTED NOT DETECTED Final   Streptococcus pyogenes NOT DETECTED NOT DETECTED Final   A.calcoaceticus-baumannii NOT DETECTED NOT DETECTED Final   Bacteroides fragilis NOT DETECTED NOT DETECTED Final   Enterobacterales DETECTED (A) NOT DETECTED Final    Comment: Enterobacterales represent a large order of gram negative bacteria, not a single organism. CRITICAL RESULT CALLED TO, READ BACK BY AND VERIFIED WITH: NATHAN BELUE PHARMD [phone] 06/05/24 HNM    Enterobacter cloacae complex NOT DETECTED NOT DETECTED Final   Escherichia coli DETECTED (A) NOT DETECTED Final    Comment: CRITICAL RESULT CALLED TO, READ BACK BY AND VERIFIED WITH: NATHAN BELUE PHARMD  [id number] 06/05/24 HNM    Klebsiella aerogenes NOT DETECTED NOT DETECTED Final   Klebsiella oxytoca NOT DETECTED NOT DETECTED Final   Klebsiella pneumoniae NOT DETECTED NOT DETECTED Final    Proteus species NOT DETECTED NOT DETECTED Final   Salmonella species NOT DETECTED NOT DETECTED Final   Serratia marcescens NOT DETECTED NOT DETECTED Final   Haemophilus influenzae NOT DETECTED NOT DETECTED Final   Neisseria meningitidis NOT DETECTED NOT DETECTED Final   Pseudomonas aeruginosa NOT DETECTED NOT DETECTED Final   Stenotrophomonas maltophilia NOT DETECTED NOT DETECTED Final   Candida albicans NOT DETECTED NOT DETECTED Final   Candida auris NOT DETECTED NOT DETECTED Final   Candida glabrata NOT DETECTED NOT DETECTED Final   Candida krusei NOT DETECTED NOT DETECTED Final   Candida parapsilosis NOT DETECTED NOT DETECTED Final   Candida tropicalis NOT DETECTED NOT DETECTED Final   Cryptococcus neoformans/gattii NOT DETECTED NOT DETECTED Final   CTX-M ESBL NOT DETECTED NOT DETECTED Final   Carbapenem resistance IMP NOT DETECTED NOT DETECTED Final   Carbapenem resistance KPC NOT DETECTED NOT DETECTED Final   Carbapenem resistance NDM NOT DETECTED NOT DETECTED Final   Carbapenem resist OXA 48 LIKE NOT DETECTED NOT DETECTED Final   Carbapenem resistance VIM NOT DETECTED NOT DETECTED Final    Comment: Performed at [facility], [address] [place], [place] [address]  Blood culture (routine x 2)     Status: Abnormal   Collection Time: 06/04/24  3:20 PM   Specimen: BLOOD  Result Value Ref Range Status   Specimen Description   Final    BLOOD RIGHT ARM Performed at [facility], [address] [place], [place] [address]    Special Requests   Final    BOTTLES DRAWN AEROBIC AND ANAEROBIC Blood Culture results may not be optimal due to an inadequate volume of blood received in culture bottles Performed at [facility], [address] [place], [place] [address]    Culture  Setup Time   Final    GRAM NEGATIVE RODS IN BOTH AEROBIC AND ANAEROBIC BOTTLES CRITICAL VALUE NOTED.  VALUE IS CONSISTENT WITH PREVIOUSLY REPORTED AND CALLED VALUE. Performed  at [facility], [address] [place], [place] [address]    Culture (A)  Final    ESCHERICHIA COLI SUSCEPTIBILITIES PERFORMED ON PREVIOUS CULTURE WITHIN THE LAST 5 DAYS. Performed at [facility], [address] [place], [place] [address]    Report Status 06/07/2024 FINAL  Final  Respiratory (~20 pathogens) panel by PCR     Status: None   Collection Time: 06/05/24  4:51 PM   Specimen: Nasopharyngeal Swab; Respiratory  Result Value Ref Range Status   Adenovirus NOT DETECTED NOT DETECTED Final   Coronavirus 229E NOT DETECTED NOT DETECTED Final    Comment: (NOTE) The Coronavirus on the Respiratory Panel, DOES NOT test for the novel  Coronavirus (2019 nCoV)    Coronavirus HKU1 NOT DETECTED NOT DETECTED Final   Coronavirus NL63 NOT DETECTED NOT DETECTED Final   Coronavirus OC43 NOT DETECTED NOT DETECTED Final   Metapneumovirus NOT DETECTED NOT DETECTED Final   Rhinovirus / Enterovirus NOT DETECTED NOT DETECTED Final   Influenza A NOT DETECTED NOT DETECTED Final   Influenza B NOT DETECTED NOT DETECTED Final   Parainfluenza Virus 1 NOT DETECTED NOT DETECTED Final   Parainfluenza Virus 2 NOT DETECTED NOT DETECTED Final   Parainfluenza Virus 3 NOT DETECTED NOT DETECTED Final   Parainfluenza Virus 4 NOT DETECTED  NOT DETECTED Final   Respiratory Syncytial Virus NOT DETECTED NOT DETECTED Final   Bordetella pertussis NOT DETECTED NOT DETECTED Final   Bordetella Parapertussis NOT DETECTED NOT DETECTED Final   Chlamydophila pneumoniae NOT DETECTED NOT DETECTED Final   Mycoplasma pneumoniae NOT DETECTED NOT DETECTED Final    Comment: Performed at [facility], [address] [place], [place] [address]     Radiology Studies: No results found.   Scheduled Meds:  amiodarone   200 mg Oral Daily   aspirin  EC  81 mg Oral Daily   budesonide -glycopyrrolate -formoterol   2 puff Inhalation BID   diltiazem   180 mg Oral Daily   enoxaparin  (LOVENOX ) injection  45 mg  Subcutaneous Q24H   pantoprazole   40 mg Oral Daily   predniSONE   40 mg Oral Q breakfast   roflumilast   500 mcg Oral Daily   sertraline   200 mg Oral Daily   spironolactone   12.5 mg Oral Daily   torsemide   80 mg Oral q morning   And   torsemide   40 mg Oral QPM   Continuous Infusions:  cefTRIAXone  (ROCEPHIN )  IV 2 g (06/07/24 0421)     LOS: 3 days  MDM: Patient is high risk for one or more organ failure.  They necessitate ongoing hospitalization for continued IV therapies and subsequent lab monitoring. Total time spent interpreting labs and vitals, reviewing the medical record, coordinating care amongst consultants and care team members, directly assessing and discussing care with the patient and/or family: 55 min  Laree Lock, MD [facility]  To contact the attending physician between 7A-7P please use Epic Chat. To contact the covering physician during after hours 7P-7A, please review Amion.  06/07/2024, 6:08 PM   *This document has been created with the assistance of dictation software. Please excuse typographical errors. *

## 2024-06-08 ENCOUNTER — Inpatient Hospital Stay

## 2024-06-08 LAB — CBC
HCT: 41.6 % (ref 36.0–46.0)
Hemoglobin: 12.9 g/dL (ref 12.0–15.0)
MCH: 28.6 pg (ref 26.0–34.0)
MCHC: 31 g/dL (ref 30.0–36.0)
MCV: 92.2 fL (ref 80.0–100.0)
Platelets: 282 K/uL (ref 150–400)
RBC: 4.51 MIL/uL (ref 3.87–5.11)
RDW: 16.6 % — ABNORMAL HIGH (ref 11.5–15.5)
WBC: 14.9 K/uL — ABNORMAL HIGH (ref 4.0–10.5)
nRBC: 0 % (ref 0.0–0.2)

## 2024-06-08 LAB — GLUCOSE, CAPILLARY: Glucose-Capillary: 110 mg/dL — ABNORMAL HIGH (ref 70–99)

## 2024-06-08 LAB — BASIC METABOLIC PANEL WITH GFR
Anion gap: 13 (ref 5–15)
BUN: 32 mg/dL — ABNORMAL HIGH (ref 8–23)
CO2: 33 mmol/L — ABNORMAL HIGH (ref 22–32)
Calcium: 8.8 mg/dL — ABNORMAL LOW (ref 8.9–10.3)
Chloride: 94 mmol/L — ABNORMAL LOW (ref 98–111)
Creatinine, Ser: 1.02 mg/dL — ABNORMAL HIGH (ref 0.44–1.00)
GFR, Estimated: >60 mL/min (ref >60)
Glucose, Bld: 108 mg/dL — ABNORMAL HIGH (ref 70–99)
Potassium: 2.8 mmol/L — ABNORMAL LOW (ref 3.5–5.1)
Sodium: 140 mmol/L (ref 135–145)

## 2024-06-08 LAB — MAGNESIUM: Magnesium: 2.1 mg/dL (ref 1.7–2.4)

## 2024-06-08 LAB — POTASSIUM: Potassium: 4.4 mmol/L (ref 3.5–5.1)

## 2024-06-08 MED ORDER — METHYLPREDNISOLONE SODIUM SUCC 40 MG IJ SOLR
40.0000 mg | Freq: Two times a day (BID) | INTRAMUSCULAR | Status: DC
  Administered 2024-06-08 – 2024-06-09 (×2): 40 mg via INTRAVENOUS
  Filled 2024-06-08 (×2): qty 1

## 2024-06-08 MED ORDER — POTASSIUM CHLORIDE 20 MEQ PO PACK
40.0000 meq | PACK | ORAL | Status: AC
  Administered 2024-06-08 (×2): 40 meq via ORAL
  Filled 2024-06-08 (×2): qty 2

## 2024-06-08 NOTE — Progress Notes (Signed)
 Per report- okay to  use purewick HS per Dr. Jerelene due to dyspnea with exertion and frequency during sleep.

## 2024-06-08 NOTE — Plan of Care (Signed)
  Problem: Education: Goal: Knowledge of General Education information will improve Description: Including pain rating scale, medication(s)/side effects and non-pharmacologic comfort measures Outcome: Progressing   Problem: Health Behavior/Discharge Planning: Goal: Ability to manage health-related needs will improve Outcome: Progressing   Problem: Clinical Measurements: Goal: Ability to maintain clinical measurements within normal limits will improve Outcome: Progressing Goal: Will remain free from infection Outcome: Progressing Goal: Diagnostic test results will improve Outcome: Progressing Goal: Respiratory complications will improve Outcome: Progressing Goal: Cardiovascular complication will be avoided Outcome: Progressing   Problem: Activity: Goal: Risk for activity intolerance will decrease Outcome: Progressing   Problem: Activity: Goal: Risk for activity intolerance will decrease Outcome: Progressing   Problem: Nutrition: Goal: Adequate nutrition will be maintained Outcome: Progressing   Problem: Pain Managment: Goal: General experience of comfort will improve and/or be controlled Outcome: Progressing   Problem: Elimination: Goal: Will not experience complications related to bowel motility Outcome: Progressing Goal: Will not experience complications related to urinary retention Outcome: Progressing   Problem: Safety: Goal: Ability to remain free from injury will improve Outcome: Progressing   Problem: Skin Integrity: Goal: Risk for impaired skin integrity will decrease Outcome: Progressing

## 2024-06-08 NOTE — Progress Notes (Signed)
 PROGRESS NOTE    Theresa [name]  FMW:[id number] DOB: 21-Aug-1956 DOA: 06/04/2024 PCP: Odell Tor Edra CINDERELLA, MD  Chief Complaint  Patient presents with   Shortness of Breath   Flank Pain   Emesis    Hospital Course:  Theresa [name] is a 68 y.o. female with medical history significant of COPD on 4L O2, dCHF, Afib not on anticoagulation, who presented with N/V and abdominal pain.     3 days ago, pt developed pain in her abdomen that radiated up to her neck.  Started having N/V 2 days ago, and couldn't keep even liquid down.  No diarrhea.  Denied dysuria, but reported urine smelled bad.  Continued to have good urine output with home diuretics.  Reported dyspnea but stable on home 4L O2.  No prior hx of UTI.   ED Course: initial vitals: afebrile pulse 96, BP 118/61, RR 24, sating 96% on 5L.  Labs notable for potassium 2.9, WBC 30.6, UA large Hgb, large leuk.  CTA chest no PE and no acute finding.  CT a/p showed bilateral pyelonephritis.  Pt received 2L NS, Vanc/cefe/flagyl  in the ED before admission  Subjective: Patient was examined at the bedside, Reports B/l flank pain with no improvement, L > R now. Reports having cough and inc wheezing. Spouse at the bedside, discussed plan of care and answered all questions   Objective: Vitals:   06/07/24 1429 06/07/24 1935 06/08/24 0301 06/08/24 0802  BP: 117/72 137/75 120/79 121/81  Pulse: 87 90 84 95  Resp: 20 16 20 20   Temp: 98.4 F (36.9 C) 97.8 F (36.6 C) 98.5 F (36.9 C) 98.1 F (36.7 C)  TempSrc: Oral     SpO2: 94% 94% 96% 95%  Weight:      Height:        Intake/Output Summary (Last 24 hours) at 06/08/2024 1510 Last data filed at 06/08/2024 0900 Gross per 24 hour  Intake 480 ml  Output 2500 ml  Net -2020 ml   Filed Weights   06/04/24 1356  Weight: 92.5 kg    Examination: Constitutional: NAD, AAOx3 HEENT: conjunctivae and lids normal, EOMI CV: No cyanosis.   RESP: normal respiratory effort, b/l wheezing + , on  4L Abd: Soft, distended due to body habitus, L CVA TTP > R Extremities: No effusions, edema in BLE SKIN: warm, dry Neuro: II - XII grossly intact.   Psych: Normal mood and affect.  Appropriate judgement and reason  Assessment & Plan:  Principal Problem:   Acute pyelonephritis  E.coli bacteremia Acute bilateral pyelonephritis --started on Vanc/cefe/flagyl  - Bcx Enterobacterales, E.coli, pansensitive - Ucx insignificant growth - CT abd bilateral pyelonephritis, right > left, Small amount of air in the bladder may be related to recent instrumentation/infection - Repeats US  Kidney to r/o abscess (minimal improvement inspite of being on abx) - On IV ceftriaxone , will discharge on cephalexin  when clinically improving   Sepsis - resolved --tachycardia, leukocytosis improving 14.9 (on steroids), source pyelo - LA normal   Acute COPD exacerbation Chronic hypoxemic respiratory failure --stable.  Some increased dyspnea likely due to sepsis, RVP negative --cont home 4L O2 --cont home bronchodilators, change po prednisone  to IV Solumedrol 40mg  bid - Repeat CXR pending --Duonebs prn   Hx of Afib --not on anticoagulation due to hx of GI bleed --cont home amiodarone  and cardizem    AKI  --Cr 1.15 -> 0.78 -> 1.02.  Received 2L NS in the ED. --Torsemide  held   Chronic dCHF -- On aldactone  --  Torsemide  held due to AKI   Hypokalemia - Mag normal - Monitor and replete as needed  Mild hyponatremia - resolved - s/p Iv fluids on presentation   Chronic pain on chronic opioids --increase home Norco frequency due to acute abdominal pain from Pyelo  Prolonged Qtc -- change Zofran  to Compazine  -- Monitor Qtc  Obesity Class II - BMI 37.3 - lifestyle modification and weight loss counselling  Incidental findings: Nonobstructing left renal calculus. Stable 1 cm hypodensity in the uncinate process of the pancreas -recommend MRI follow-up in 1 year  DVT prophylaxis: Lovenox  SQ   Code  Status: Full Code Disposition:  Home  Consultants:  None  Procedures:  None  Antimicrobials:  Anti-infectives (From admission, onward)    Start     Dose/Rate Route Frequency Ordered Stop   06/05/24 0400  cefTRIAXone  (ROCEPHIN ) 2 g in sodium chloride  0.9 % 100 mL IVPB        2 g 200 mL/hr over 30 Minutes Intravenous Every 24 hours 06/04/24 1842     06/04/24 1500  ceFEPIme  (MAXIPIME ) 2 g in sodium chloride  0.9 % 100 mL IVPB        2 g 200 mL/hr over 30 Minutes Intravenous  Once 06/04/24 1451 06/04/24 1630   06/04/24 1500  metroNIDAZOLE  (FLAGYL ) IVPB 500 mg        500 mg 100 mL/hr over 60 Minutes Intravenous  Once 06/04/24 1451 06/04/24 1808   06/04/24 1500  vancomycin  (VANCOCIN ) IVPB 1000 mg/200 mL premix  Status:  Discontinued        1,000 mg 200 mL/hr over 60 Minutes Intravenous  Once 06/04/24 1451 06/04/24 1451   06/04/24 1500  vancomycin  (VANCOREADY) IVPB 2000 mg/400 mL  Status:  Discontinued        2,000 mg 200 mL/hr over 120 Minutes Intravenous  Once 06/04/24 1452 06/04/24 1800       Data Reviewed: I have personally reviewed following labs and imaging studies CBC: Recent Labs  Lab 06/04/24 1358 06/05/24 0215 06/06/24 0523 06/07/24 0331 06/08/24 0438  WBC 30.6* 42.7* 25.1* 14.9* 14.9*  HGB 12.4 11.5* 11.7* 11.3* 12.9  HCT 38.2 36.9 39.1 37.6 41.6  MCV 89.9 92.5 94.9 95.2 92.2  PLT 152 136* 181 232 282   Basic Metabolic Panel: Recent Labs  Lab 06/04/24 1358 06/04/24 1532 06/05/24 0215 06/05/24 1726 06/06/24 0523 06/07/24 0331 06/08/24 0438  NA 133*  --  136  --  141 140 140  K 2.9*  --  2.9* 4.5 4.4 4.5 2.8*  CL 96*  --  103  --  109 105 94*  CO2 24  --  23  --  23 25 33*  GLUCOSE 118*  --  180*  --  116* 135* 108*  BUN 18  --  21  --  25* 21 32*  CREATININE 1.15*  --  1.13*  --  1.05* 0.78 1.02*  CALCIUM  9.1  --  9.0  --  9.7 9.7 8.8*  MG  --  2.2 2.4  --  2.7* 2.2 2.1   GFR: Estimated Creatinine Clearance: 56.7 mL/min (A) (by C-G formula based  on SCr of 1.02 mg/dL (H)). Liver Function Tests: Recent Labs  Lab 06/04/24 1358  AST 29  ALT 38  ALKPHOS 109  BILITOT 0.9  PROT 7.0  ALBUMIN 3.4*   CBG: Recent Labs  Lab 06/08/24 0805  GLUCAP 110*    Recent Results (from the past 240 hours)  Urine Culture  Status: Abnormal   Collection Time: 06/04/24  2:45 PM   Specimen: Urine, Random  Result Value Ref Range Status   Specimen Description   Final    URINE, RANDOM Performed at [facility], [address], [place], [place] [address]    Special Requests   Final    NONE Reflexed from [id number] Performed at [facility], [address] [place], [place] [address]    Culture (A)  Final    <10,000 COLONIES/mL INSIGNIFICANT GROWTH Performed at [facility], [address], [place], [place] [address]    Report Status 06/05/2024 FINAL  Final  Blood culture (routine x 2)     Status: Abnormal   Collection Time: 06/04/24  3:15 PM   Specimen: BLOOD  Result Value Ref Range Status   Specimen Description   Final    BLOOD LEFT ANTECUBITAL Performed at [facility], [address] [place], [place] [address]    Special Requests   Final    BOTTLES DRAWN AEROBIC AND ANAEROBIC Blood Culture results may not be optimal due to an inadequate volume of blood received in culture bottles Performed at [facility], [address] [place], [place] [address]    Culture  Setup Time   Final    GRAM NEGATIVE RODS AEROBIC BOTTLE ONLY CRITICAL RESULT CALLED TO, READ BACK BY AND VERIFIED WITHBETHA TERESIA DILLS PHARMD [id number] 06/05/24 HNM Performed at [facility], [address], [place], [place] [address]    Culture ESCHERICHIA COLI (A)  Final   Report Status 06/07/2024 FINAL  Final   Organism ID, Bacteria ESCHERICHIA COLI  Final      Susceptibility   Escherichia coli - MIC*    AMPICILLIN <=2 SENSITIVE Sensitive     CEFAZOLIN  (NON-URINE) <=1 SENSITIVE Sensitive     CEFEPIME  <=0.12 SENSITIVE  Sensitive     ERTAPENEM <=0.12 SENSITIVE Sensitive     CEFTRIAXONE  <=0.25 SENSITIVE Sensitive     CIPROFLOXACIN  <=0.06 SENSITIVE Sensitive     GENTAMICIN <=1 SENSITIVE Sensitive     MEROPENEM <=0.25 SENSITIVE Sensitive     TRIMETH /SULFA  <=20 SENSITIVE Sensitive     AMPICILLIN/SULBACTAM <=2 SENSITIVE Sensitive     PIP/TAZO Value in next row Sensitive ug/mL     <=4 SENSITIVEThis is a modified FDA-approved test that has been validated and its performance characteristics determined by the reporting laboratory.  This laboratory is certified under the Clinical Laboratory Improvement Amendments CLIA as qualified to perform high complexity clinical laboratory testing.    * ESCHERICHIA COLI  Blood Culture ID Panel (Reflexed)     Status: Abnormal   Collection Time: 06/04/24  3:15 PM  Result Value Ref Range Status   Enterococcus faecalis NOT DETECTED NOT DETECTED Final   Enterococcus Faecium NOT DETECTED NOT DETECTED Final   Listeria monocytogenes NOT DETECTED NOT DETECTED Final   Staphylococcus species NOT DETECTED NOT DETECTED Final   Staphylococcus aureus (BCID) NOT DETECTED NOT DETECTED Final   Staphylococcus epidermidis NOT DETECTED NOT DETECTED Final   Staphylococcus lugdunensis NOT DETECTED NOT DETECTED Final   Streptococcus species NOT DETECTED NOT DETECTED Final   Streptococcus agalactiae NOT DETECTED NOT DETECTED Final   Streptococcus pneumoniae NOT DETECTED NOT DETECTED Final   Streptococcus pyogenes NOT DETECTED NOT DETECTED Final   A.calcoaceticus-baumannii NOT DETECTED NOT DETECTED Final   Bacteroides fragilis NOT DETECTED NOT DETECTED Final   Enterobacterales DETECTED (A) NOT DETECTED Final    Comment: Enterobacterales represent a large order of gram  negative bacteria, not a single organism. CRITICAL RESULT CALLED TO, READ BACK BY AND VERIFIED WITH: NATHAN BELUE PHARMD [id number] 06/05/24 HNM    Enterobacter cloacae complex NOT DETECTED NOT DETECTED Final   Escherichia coli DETECTED (A)  NOT DETECTED Final    Comment: CRITICAL RESULT CALLED TO, READ BACK BY AND VERIFIED WITH: RANKIN DILLS PHARMD [id number] 06/05/24 HNM    Klebsiella aerogenes NOT DETECTED NOT DETECTED Final   Klebsiella oxytoca NOT DETECTED NOT DETECTED Final   Klebsiella pneumoniae NOT DETECTED NOT DETECTED Final   Proteus species NOT DETECTED NOT DETECTED Final   Salmonella species NOT DETECTED NOT DETECTED Final   Serratia marcescens NOT DETECTED NOT DETECTED Final   Haemophilus influenzae NOT DETECTED NOT DETECTED Final   Neisseria meningitidis NOT DETECTED NOT DETECTED Final   Pseudomonas aeruginosa NOT DETECTED NOT DETECTED Final   Stenotrophomonas maltophilia NOT DETECTED NOT DETECTED Final   Candida albicans NOT DETECTED NOT DETECTED Final   Candida auris NOT DETECTED NOT DETECTED Final   Candida glabrata NOT DETECTED NOT DETECTED Final   Candida krusei NOT DETECTED NOT DETECTED Final   Candida parapsilosis NOT DETECTED NOT DETECTED Final   Candida tropicalis NOT DETECTED NOT DETECTED Final   Cryptococcus neoformans/gattii NOT DETECTED NOT DETECTED Final   CTX-M ESBL NOT DETECTED NOT DETECTED Final   Carbapenem resistance IMP NOT DETECTED NOT DETECTED Final   Carbapenem resistance KPC NOT DETECTED NOT DETECTED Final   Carbapenem resistance NDM NOT DETECTED NOT DETECTED Final   Carbapenem resist OXA 48 LIKE NOT DETECTED NOT DETECTED Final   Carbapenem resistance VIM NOT DETECTED NOT DETECTED Final    Comment: Performed at [facility], [address] [place], [place] [address]  Blood culture (routine x 2)     Status: Abnormal   Collection Time: 06/04/24  3:20 PM   Specimen: BLOOD  Result Value Ref Range Status   Specimen Description   Final    BLOOD RIGHT ARM Performed at [facility], [address] [place], [place] [address]    Special Requests   Final    BOTTLES DRAWN AEROBIC AND ANAEROBIC Blood Culture results may not be optimal due to an inadequate volume of blood  received in culture bottles Performed at [facility], [address] [place], [place] [address]    Culture  Setup Time   Final    GRAM NEGATIVE RODS IN BOTH AEROBIC AND ANAEROBIC BOTTLES CRITICAL VALUE NOTED.  VALUE IS CONSISTENT WITH PREVIOUSLY REPORTED AND CALLED VALUE. Performed at [facility], [address] [place], [place] [address]    Culture (A)  Final    ESCHERICHIA COLI SUSCEPTIBILITIES PERFORMED ON PREVIOUS CULTURE WITHIN THE LAST 5 DAYS. Performed at [facility], [address], [place], [place] [address]    Report Status 06/07/2024 FINAL  Final  Respiratory (~20 pathogens) panel by PCR     Status: None   Collection Time: 06/05/24  4:51 PM   Specimen: Nasopharyngeal Swab; Respiratory  Result Value Ref Range Status   Adenovirus NOT DETECTED NOT DETECTED Final   Coronavirus 229E NOT DETECTED NOT DETECTED Final    Comment: (NOTE) The Coronavirus on the Respiratory Panel, DOES NOT test for the novel  Coronavirus (2019 nCoV)    Coronavirus HKU1 NOT DETECTED NOT DETECTED Final   Coronavirus NL63 NOT DETECTED NOT DETECTED Final   Coronavirus OC43 NOT DETECTED NOT DETECTED Final   Metapneumovirus NOT DETECTED NOT DETECTED Final   Rhinovirus / Enterovirus NOT  DETECTED NOT DETECTED Final   Influenza A NOT DETECTED NOT DETECTED Final   Influenza B NOT DETECTED NOT DETECTED Final   Parainfluenza Virus 1 NOT DETECTED NOT DETECTED Final   Parainfluenza Virus 2 NOT DETECTED NOT DETECTED Final   Parainfluenza Virus 3 NOT DETECTED NOT DETECTED Final   Parainfluenza Virus 4 NOT DETECTED NOT DETECTED Final   Respiratory Syncytial Virus NOT DETECTED NOT DETECTED Final   Bordetella pertussis NOT DETECTED NOT DETECTED Final   Bordetella Parapertussis NOT DETECTED NOT DETECTED Final   Chlamydophila pneumoniae NOT DETECTED NOT DETECTED Final   Mycoplasma pneumoniae NOT DETECTED NOT DETECTED Final    Comment: Performed at [facility], [address], [place], [place] [address]  Culture, blood (single) w Reflex to ID Panel     Status: None (Preliminary result)   Collection Time: 06/07/24  1:26 PM   Specimen: BLOOD LEFT HAND  Result Value Ref Range Status   Specimen Description BLOOD LEFT HAND  Final   Special Requests   Final    BOTTLES DRAWN AEROBIC AND ANAEROBIC Blood Culture adequate volume   Culture   Final    NO GROWTH < 24 HOURS Performed at [facility], [address] [place], [place] [address]    Report Status PENDING  Incomplete     Radiology Studies: No results found.   Scheduled Meds:  amiodarone   200 mg Oral Daily   aspirin  EC  81 mg Oral Daily   budesonide -glycopyrrolate -formoterol   2 puff Inhalation BID   diltiazem   180 mg Oral Daily   enoxaparin  (LOVENOX ) injection  45 mg Subcutaneous Q24H   pantoprazole   40 mg Oral Daily   potassium chloride   40 mEq Oral Q4H   predniSONE   40 mg Oral Q breakfast   roflumilast   500 mcg Oral Daily   sertraline   200 mg Oral Daily   spironolactone   12.5 mg Oral Daily   Continuous Infusions:  cefTRIAXone  (ROCEPHIN )  IV 2 g (06/08/24 0455)     LOS: 4 days  MDM: Patient is high risk for one or more organ failure.  They necessitate ongoing hospitalization for continued IV therapies and subsequent lab monitoring. Total time spent interpreting labs and vitals, reviewing the medical record, coordinating care amongst consultants and care team members, directly assessing and discussing care with the patient and/or family: 55 min  Laree Lock, MD [facility]  To contact the attending physician between 7A-7P please use Epic Chat. To contact the covering physician during after hours 7P-7A, please review Amion.  06/08/2024, 3:10 PM   *This document has been created with the assistance of dictation software. Please excuse typographical errors. *

## 2024-06-09 LAB — CBC
HCT: 43.5 % (ref 36.0–46.0)
Hemoglobin: 13 g/dL (ref 12.0–15.0)
MCH: 27.8 pg (ref 26.0–34.0)
MCHC: 29.9 g/dL — ABNORMAL LOW (ref 30.0–36.0)
MCV: 93.1 fL (ref 80.0–100.0)
Platelets: 284 K/uL (ref 150–400)
RBC: 4.67 MIL/uL (ref 3.87–5.11)
RDW: 16.4 % — ABNORMAL HIGH (ref 11.5–15.5)
WBC: 12.2 K/uL — ABNORMAL HIGH (ref 4.0–10.5)
nRBC: 0 % (ref 0.0–0.2)

## 2024-06-09 LAB — TSH: TSH: 0.36 u[IU]/mL (ref 0.350–4.500)

## 2024-06-09 LAB — BASIC METABOLIC PANEL WITH GFR
Anion gap: 12 (ref 5–15)
BUN: 28 mg/dL — ABNORMAL HIGH (ref 8–23)
CO2: 32 mmol/L (ref 22–32)
Calcium: 9.1 mg/dL (ref 8.9–10.3)
Chloride: 97 mmol/L — ABNORMAL LOW (ref 98–111)
Creatinine, Ser: 0.92 mg/dL (ref 0.44–1.00)
GFR, Estimated: >60 mL/min (ref >60)
Glucose, Bld: 131 mg/dL — ABNORMAL HIGH (ref 70–99)
Potassium: 4.3 mmol/L (ref 3.5–5.1)
Sodium: 141 mmol/L (ref 135–145)

## 2024-06-09 LAB — MAGNESIUM: Magnesium: 2.6 mg/dL — ABNORMAL HIGH (ref 1.7–2.4)

## 2024-06-09 MED ORDER — NYSTATIN 100000 UNIT/ML MT SUSP
5.0000 mL | Freq: Four times a day (QID) | OROMUCOSAL | Status: DC
  Administered 2024-06-09: 500000 [IU] via ORAL
  Filled 2024-06-09: qty 5

## 2024-06-09 MED ORDER — TORSEMIDE 20 MG PO TABS: 40.0000 mg | ORAL_TABLET | ORAL | Status: AC

## 2024-06-09 MED ORDER — NYSTATIN 100000 UNIT/ML MT SUSP: 5.0000 mL | Freq: Four times a day (QID) | OROMUCOSAL | 0 refills | Status: AC

## 2024-06-09 MED ORDER — GUAIFENESIN 100 MG/5ML PO LIQD: 5.0000 mL | ORAL | 0 refills | Status: DC | PRN

## 2024-06-09 MED ORDER — CEPHALEXIN 500 MG PO CAPS: 500.0000 mg | ORAL_CAPSULE | Freq: Four times a day (QID) | ORAL | 0 refills | Status: AC

## 2024-06-09 MED ORDER — DOXYCYCLINE HYCLATE 100 MG PO TABS: 100.0000 mg | ORAL_TABLET | Freq: Two times a day (BID) | ORAL | 0 refills | Status: AC

## 2024-06-09 MED ORDER — PREDNISONE 20 MG PO TABS: ORAL_TABLET | ORAL | 0 refills | Status: AC

## 2024-06-09 NOTE — Plan of Care (Signed)

## 2024-06-09 NOTE — Progress Notes (Signed)
 AVS Discharge instructions provided to pt and pt's family, at bedside, with all questions answered at this time. Teach-back technique utilized. All PIVs removed, sites WDL. All pt belongings taken with pt, pt verified. Pt transported home on 5 L [place].

## 2024-06-09 NOTE — Discharge Summary (Signed)
 Physician Discharge Summary   Patient: Theresa [name] MRN: [id number] DOB: 1956-07-17  Admit date:     06/04/2024  Discharge date: 06/09/24  Discharge Physician: Laree Lock   PCP: Odell Chard, Edra GRADE, MD   Recommendations at discharge:   Follow up with PCP within 1 week - Repeat BMP, Mag, CBC Incidental findings - hypodensity in pancreas - Repeat MRI in 1 year  Discharge Diagnoses: Principal Problem:   Acute pyelonephritis  Resolved Problems:   * No resolved hospital problems. *  Hospital Course: Theresa [name] is a 68 y.o. female with medical history significant of COPD on 4L O2, dCHF, Afib not on anticoagulation, who presented with N/V and abdominal pain.   Admitted for Sepsis due to E.coli bacteremia and acute bilateral pyelonephritis, Acute COPD exacerbation, AKI. Hospital course as below  E.coli bacteremia Acute bilateral pyelonephritis - Bcx Enterobacterales, E.coli, pansensitive - Ucx insignificant growth - CT abd bilateral pyelonephritis, right > left, Small amount of air in the bladder may be related to recent instrumentation/infection - Repeats US  Kidney negative - Symptoms improving, pain better - was on IV ceftriaxone , discharge on cephalexin  to complete 10 days   Sepsis - resolved --tachycardia, leukocytosis improving(on steroids), source pyelo - LA normal   Acute COPD exacerbation Chronic hypoxemic respiratory failure --stable.  Some increased dyspnea likely due to sepsis, RVP negative --cont home 4L O2 --cont home bronchodilators, was on IV Solumedrol, discharge on po prednisone  taper, doxycycline  course  Hx of Afib --not on anticoagulation due to hx of GI bleed --home amiodarone  and cardizem    AKI  - resolved - s/p IV fluids in ED and diuretics held  Chronic dCHF -- On aldactone  and torsemide    Hypokalemia - resolved - Mag normal - on supplements at home   Mild hyponatremia - resolved - s/p Iv fluids on presentation   Chronic  pain on chronic opioids - Norco at home   Prolonged Qtc -- change Zofran  to Compazine  -- Qtc 677   Obesity Class II - BMI 37.3 - lifestyle modification and weight loss counselling  Oropharyngeal candidiasis - Nystatin  swish and swallow x 10 days  B/l hand tremors - check TSH - Follow up with Neurology outpatient   Incidental findings: Nonobstructing left renal calculus. Stable 1 cm hypodensity in the uncinate process of the pancreas -recommend MRI follow-up in 1 year   Consultants: None Procedures performed: None  Disposition: Home Diet recommendation:  Discharge Diet Orders (From admission, onward)     Start     Ordered   06/09/24 0000  Diet - low sodium heart healthy        06/09/24 1256            DISCHARGE MEDICATION: Allergies as of 06/09/2024       Reactions   Gabapentin  Other (See Comments)   Higher doses of 800 mg, 600 mg, 300 mg causes excessive sedation and contributes to numerous mechanical falls. (Unsure if 100 mg dose can be tolerated)   Codeine Nausea And Vomiting   Meloxicam  Other (See Comments)   Stomach pain        Medication List     TAKE these medications    acetaminophen  500 MG tablet Commonly known as: TYLENOL  Take 1,000 mg by mouth every 8 (eight) hours as needed for mild pain or headache.   albuterol  108 (90 Base) MCG/ACT inhaler Commonly known as: VENTOLIN  HFA Inhale 2 puffs into the lungs every 6 (six) hours as needed for wheezing or shortness  of breath.   amiodarone  200 MG tablet Commonly known as: PACERONE  Take 1 tablet (200 mg total) by mouth daily.   aspirin  EC 81 MG tablet Take 81 mg by mouth daily. Swallow whole.   cephALEXin  500 MG capsule Commonly known as: KEFLEX  Take 1 capsule (500 mg total) by mouth 4 (four) times daily for 5 days. Start taking on: June 10, 2024   Cholecalciferol  50 MCG (2000 UT) Caps Take 2,000 Units by mouth daily.   dapagliflozin  propanediol 10 MG Tabs tablet Commonly known as:  Farxiga  Take 1 tablet (10 mg total) by mouth daily.   diltiazem  180 MG 24 hr capsule Commonly known as: CARDIZEM  CD Take 180 mg by mouth daily.   doxycycline  100 MG tablet Commonly known as: VIBRA -TABS Take 1 tablet (100 mg total) by mouth 2 (two) times daily for 5 days.   guaiFENesin  100 MG/5ML liquid Commonly known as: ROBITUSSIN Take 5 mLs by mouth every 4 (four) hours as needed for cough or to loosen phlegm.   HYDROcodone -acetaminophen  5-325 MG tablet Commonly known as: NORCO/VICODIN Take 1 tablet by mouth every 8 (eight) hours as needed for severe pain (pain score 7-10). Must last 30 days   HYDROcodone -acetaminophen  5-325 MG tablet Commonly known as: NORCO/VICODIN Take 1 tablet by mouth every 8 (eight) hours as needed for severe pain (pain score 7-10). Must last 30 days   HYDROcodone -acetaminophen  5-325 MG tablet Commonly known as: NORCO/VICODIN Take 1 tablet by mouth every 8 (eight) hours as needed for severe pain (pain score 7-10). Must last 30 days Start taking on: June 24, 2024   ipratropium-albuterol  0.5-2.5 (3) MG/3ML Soln Commonly known as: DUONEB USE 1 VIAL VIA NEBULIZER EVERY 6 HOURS AS NEEDED FOR SHORTNESS OF BREATH OR WHEEZING   naloxone  4 MG/0.1ML Liqd nasal spray kit Commonly known as: NARCAN  Place 1 spray into the nose as needed for up to 365 doses (for opioid-induced respiratory depresssion). In case of emergency (overdose), spray once into each nostril. If no response within 3 minutes, repeat application and call 911.   nystatin  100000 UNIT/ML suspension Commonly known as: MYCOSTATIN  Take 5 mLs (500,000 Units total) by mouth 4 (four) times daily for 10 days.   omeprazole  40 MG capsule Commonly known as: PRILOSEC Take 40 mg by mouth daily.   OXYGEN  Inhale 5 L into the lungs continuous.   potassium chloride  SA 20 MEQ tablet Commonly known as: KLOR-CON  M Take 2 tablets every morning and 1 tablet every evening   predniSONE  20 MG tablet Commonly  known as: DELTASONE  Take 2 tablets (40 mg total) by mouth daily with breakfast for 2 days, THEN 1.5 tablets (30 mg total) daily with breakfast for 3 days, THEN 1 tablet (20 mg total) daily with breakfast for 3 days, THEN 0.5 tablets (10 mg total) daily with breakfast for 3 days. Start taking on: June 09, 2024   roflumilast  500 MCG Tabs tablet Commonly known as: DALIRESP  Take 500 mcg by mouth daily.   sertraline  100 MG tablet Commonly known as: ZOLOFT  Take 2 tablets (200 mg total) by mouth daily. Please schedule office visit before any future refill.   spironolactone  25 MG tablet Commonly known as: ALDACTONE  TAKE 1/2 TABLET BY MOUTH ONCE DAILY   torsemide  20 MG tablet Commonly known as: DEMADEX  Take 2-4 tablets (40-80 mg total) by mouth See admin instructions. 80 mg in the morning, 40 mg in the evening Start taking on: June 10, 2024   Trelegy Ellipta 100-62.5-25 MCG/ACT Aepb Generic drug: Fluticasone -Umeclidin-Vilant  Inhale 1 puff into the lungs daily.        Discharge Exam: Filed Weights   06/04/24 1356  Weight: 92.5 kg    Constitutional: NAD, AAOx3 CV: No cyanosis.   RESP: normal respiratory effort, CTAB , on 4L Abd: Soft, distended due to body habitus, CVA TTP b/l significantly better Extremities: No effusions, edema in BLE SKIN: warm, dry Neuro: II - XII grossly intact.   Psych: Normal mood and affect.  Appropriate judgement and reason  Condition at discharge: good  The results of significant diagnostics from this hospitalization (including imaging, microbiology, ancillary and laboratory) are listed below for reference.   Imaging Studies: US  RENAL Result Date: 06/08/2024 CLINICAL DATA:  Flank pain. EXAM: RENAL / URINARY TRACT ULTRASOUND COMPLETE COMPARISON:  Ultrasound dated 09/08/2014. FINDINGS: Right Kidney: Renal measurements: 12.4 x 4.6 x 4.8 cm = volume: 142 mL. Normal echogenicity. No hydronephrosis or shadowing stone. Small interpolar cyst. Left Kidney:  Renal measurements: 13.1 x 6.0 x 4.3 cm = volume: 175 mL. Normal echogenicity. No hydronephrosis or shadowing stone. Bladder: Appears normal for degree of bladder distention. Other: None. IMPRESSION: No hydronephrosis or shadowing stone. Electronically Signed   By: Vanetta Chou M.D.   On: 06/08/2024 17:59   DG Chest 1 View Result Date: 06/08/2024 CLINICAL DATA:  Shortness of breath.  COPD.  Atrial fibrillation. EXAM: DG CHEST 1V COMPARISON:  06/04/2024 FINDINGS: The heart size and mediastinal contours are within normal limits. Pulmonary hyperinflation again seen, consistent with COPD. No evidence of pulmonary infiltrate or pleural effusion. IMPRESSION: COPD. No active disease. Electronically Signed   By: Norleen DELENA Kil M.D.   On: 06/08/2024 16:31   CT Angio Chest PE W and/or Wo Contrast Result Date: 06/04/2024 CLINICAL DATA:  Nausea and vomiting, right-sided flank pain for 2 days, increased oxygen  requirement, short of breath EXAM: CT ANGIOGRAPHY CHEST WITH CONTRAST TECHNIQUE: Multidetector CT imaging of the chest was performed using the standard protocol during bolus administration of intravenous contrast. Multiplanar CT image reconstructions and MIPs were obtained to evaluate the vascular anatomy. RADIATION DOSE REDUCTION: This exam was performed according to the departmental dose-optimization program which includes automated exposure control, adjustment of the mA and/or kV according to patient size and/or use of iterative reconstruction technique. CONTRAST:  OMNIPAQUE  IOHEXOL  350 MG/ML SOLN COMPARISON:  10/17/2023, 06/04/2024 FINDINGS: Cardiovascular: This is a technically adequate evaluation of the pulmonary vasculature. No filling defects or pulmonary emboli. Borderline cardiomegaly with prominent right atrial dilatation. No pericardial effusion. No evidence of thoracic aortic aneurysm or dissection. Atherosclerosis of the aorta and coronary vasculature. Mediastinum/Nodes: No enlarged mediastinal,  hilar, or axillary lymph nodes. Thyroid  gland, trachea, and esophagus demonstrate no significant findings. Lungs/Pleura: Upper lobe predominant emphysema. No airspace disease, effusion, or pneumothorax. Minimal subsegmental atelectasis or scarring at the lung bases. Central airways are patent. Upper Abdomen: No acute abnormality. Musculoskeletal: No acute or destructive bony abnormalities. Reconstructed images demonstrate no additional findings. Review of the MIP images confirms the above findings. IMPRESSION: 1. No evidence of pulmonary embolus. 2. Borderline cardiomegaly with prominent right atrial dilatation. 3. Aortic Atherosclerosis (ICD10-I70.0) and Emphysema (ICD10-J43.9). 4. Coronary artery atherosclerosis. Electronically Signed   By: Ozell Daring M.D.   On: 06/04/2024 16:25   CT ABDOMEN PELVIS W CONTRAST Result Date: 06/04/2024 CLINICAL DATA:  Acute abdominal pain EXAM: CT ABDOMEN AND PELVIS WITH CONTRAST TECHNIQUE: Multidetector CT imaging of the abdomen and pelvis was performed using the standard protocol following bolus administration of intravenous contrast. RADIATION DOSE REDUCTION: This  exam was performed according to the departmental dose-optimization program which includes automated exposure control, adjustment of the mA and/or kV according to patient size and/or use of iterative reconstruction technique. CONTRAST:  OMNIPAQUE  IOHEXOL  350 MG/ML SOLN COMPARISON:  CT abdomen and pelvis 03/02/2024. FINDINGS: Lower chest: There are scattered rounded hypodensities throughout the liver which are too small to characterize and unchanged, likely cysts or hemangiomas. The gallbladder and bile ducts are within normal limits. Hepatobiliary: There is a 1 cm hypodensity in the uncinate process of the pancreas which is unchanged. The pancreas is otherwise within normal limits. Pancreas: The spleen is mildly enlarged, unchanged. Spleen: Normal in size without focal abnormality. Adrenals/Urinary Tract:  There is a 4 mm calculus in the inferior pole of the left kidney. There is no hydronephrosis in either kidney. There is bilateral perinephric fat stranding, right greater than left. Diffuse patchy areas of hypoattenuation are seen throughout the right kidney. There are mild patchy areas of hypoattenuation in the inferior pole of the left kidney. Additionally there is wall enhancement of the right renal pelvis and right ureter with right periureteral stranding the level of the pelvic inlet. There is a small amount of air in the bladder. The bladder is otherwise within normal limits. Adrenal glands appear normal. Stomach/Bowel: Stomach is within normal limits. No evidence of bowel wall thickening, distention, or inflammatory changes. There are scattered colonic diverticula. The appendix is not visualized. Vascular/Lymphatic: Aortic atherosclerosis. No enlarged abdominal or pelvic lymph nodes. Reproductive: Uterus and bilateral adnexa are unremarkable. Other: No abdominal wall hernia or abnormality. No abdominopelvic ascites. Musculoskeletal: Multilevel degenerative changes affect the spine. IMPRESSION: 1. Findings compatible with bilateral pyelonephritis, right greater than left. 2. Nonobstructing left renal calculus. 3. Small amount of air in the bladder may be related to recent instrumentation or infection. 4. Stable 1 cm hypodensity in the uncinate process of the pancreas. Recommend follow-up MRI in 1 year. 5. Stable splenomegaly. 6. Aortic atherosclerosis. Aortic Atherosclerosis (ICD10-I70.0). Electronically Signed   By: Greig Pique M.D.   On: 06/04/2024 16:25   CT HEAD WO CONTRAST ( ) Result Date: 06/04/2024 CLINICAL DATA:  Nausea and vomiting for 2 days EXAM: CT HEAD WITHOUT CONTRAST TECHNIQUE: Contiguous axial images were obtained from the base of the skull through the vertex without intravenous contrast. RADIATION DOSE REDUCTION: This exam was performed according to the departmental dose-optimization  program which includes automated exposure control, adjustment of the mA and/or kV according to patient size and/or use of iterative reconstruction technique. COMPARISON:  11/07/2022 FINDINGS: Brain: Stable chronic small-vessel ischemic changes within the bilateral basal ganglia and periventricular white matter. No evidence of acute infarct or hemorrhage. Lateral ventricles and remaining midline structures are unremarkable. No acute extra-axial fluid collections. No mass effect. Vascular: No hyperdense vessel or unexpected calcification. Skull: Normal. Negative for fracture or focal lesion. Sinuses/Orbits: No acute finding. Other: None. IMPRESSION: 1. Stable head CT, no acute intracranial process. Electronically Signed   By: Ozell Daring M.D.   On: 06/04/2024 16:19   DG Chest 2 View Result Date: 06/04/2024 CLINICAL DATA:  Shortness of breath. EXAM: CHEST - 2 VIEW COMPARISON:  Mar 02, 2024. FINDINGS: Stable cardiomegaly. Right perihilar subsegmental atelectasis or scarring is noted. Left lung is clear. Bony thorax is unremarkable. IMPRESSION: Mild right perihilar subsegmental atelectasis or scarring is noted. Electronically Signed   By: Lynwood Landy Raddle M.D.   On: 06/04/2024 14:35    Microbiology: Results for orders placed or performed during the hospital encounter of 06/04/24  Urine Culture     Status: Abnormal   Collection Time: 06/04/24  2:45 PM   Specimen: Urine, Random  Result Value Ref Range Status   Specimen Description   Final    URINE, RANDOM Performed at [facility], [address] [place], [place] [address]    Special Requests   Final    NONE Reflexed from [id number] Performed at [facility], [address] [place], [place] [address]    Culture (A)  Final    <10,000 COLONIES/mL INSIGNIFICANT GROWTH Performed at [facility], [address] [place], [place] [address]    Report Status 06/05/2024 FINAL  Final  Blood culture (routine x 2)     Status:  Abnormal   Collection Time: 06/04/24  3:15 PM   Specimen: BLOOD  Result Value Ref Range Status   Specimen Description   Final    BLOOD LEFT ANTECUBITAL Performed at [facility], [address] [place], [place] [address]    Special Requests   Final    BOTTLES DRAWN AEROBIC AND ANAEROBIC Blood Culture results may not be optimal due to an inadequate volume of blood received in culture bottles Performed at [facility], [address], [place], [place] [address]    Culture  Setup Time   Final    GRAM NEGATIVE RODS AEROBIC BOTTLE ONLY CRITICAL RESULT CALLED TO, READ BACK BY AND VERIFIED WITHBETHA TERESIA DILLS PHARMD [id number] 06/05/24 HNM Performed at [facility], [address] [place], [place] [address]    Culture ESCHERICHIA COLI (A)  Final   Report Status 06/07/2024 FINAL  Final   Organism ID, Bacteria ESCHERICHIA COLI  Final      Susceptibility   Escherichia coli - MIC*    AMPICILLIN <=2 SENSITIVE Sensitive     CEFAZOLIN  (NON-URINE) <=1 SENSITIVE Sensitive     CEFEPIME  <=0.12 SENSITIVE Sensitive     ERTAPENEM <=0.12 SENSITIVE Sensitive     CEFTRIAXONE  <=0.25 SENSITIVE Sensitive     CIPROFLOXACIN  <=0.06 SENSITIVE Sensitive     GENTAMICIN <=1 SENSITIVE Sensitive     MEROPENEM <=0.25 SENSITIVE Sensitive     TRIMETH /SULFA  <=20 SENSITIVE Sensitive     AMPICILLIN/SULBACTAM <=2 SENSITIVE Sensitive     PIP/TAZO Value in next row Sensitive ug/mL     <=4 SENSITIVEThis is a modified FDA-approved test that has been validated and its performance characteristics determined by the reporting laboratory.  This laboratory is certified under the Clinical Laboratory Improvement Amendments CLIA as qualified to perform high complexity clinical laboratory testing.    * ESCHERICHIA COLI  Blood Culture ID Panel (Reflexed)     Status: Abnormal   Collection Time: 06/04/24  3:15 PM  Result Value Ref Range Status   Enterococcus faecalis NOT DETECTED NOT DETECTED Final   Enterococcus  Faecium NOT DETECTED NOT DETECTED Final   Listeria monocytogenes NOT DETECTED NOT DETECTED Final   Staphylococcus species NOT DETECTED NOT DETECTED Final   Staphylococcus aureus (BCID) NOT DETECTED NOT DETECTED Final   Staphylococcus epidermidis NOT DETECTED NOT DETECTED Final   Staphylococcus lugdunensis NOT DETECTED NOT DETECTED Final   Streptococcus species NOT DETECTED NOT DETECTED Final   Streptococcus agalactiae NOT DETECTED NOT DETECTED Final   Streptococcus pneumoniae NOT DETECTED NOT DETECTED Final   Streptococcus pyogenes NOT DETECTED NOT DETECTED Final   A.calcoaceticus-baumannii NOT DETECTED NOT DETECTED Final   Bacteroides fragilis NOT DETECTED NOT DETECTED Final   Enterobacterales DETECTED (A) NOT DETECTED Final    Comment:  Enterobacterales represent a large order of gram negative bacteria, not a single organism. CRITICAL RESULT CALLED TO, READ BACK BY AND VERIFIED WITH: NATHAN BELUE PHARMD [id number] 06/05/24 HNM    Enterobacter cloacae complex NOT DETECTED NOT DETECTED Final   Escherichia coli DETECTED (A) NOT DETECTED Final    Comment: CRITICAL RESULT CALLED TO, READ BACK BY AND VERIFIED WITH: RANKIN DILLS PHARMD [id number] 06/05/24 HNM    Klebsiella aerogenes NOT DETECTED NOT DETECTED Final   Klebsiella oxytoca NOT DETECTED NOT DETECTED Final   Klebsiella pneumoniae NOT DETECTED NOT DETECTED Final   Proteus species NOT DETECTED NOT DETECTED Final   Salmonella species NOT DETECTED NOT DETECTED Final   Serratia marcescens NOT DETECTED NOT DETECTED Final   Haemophilus influenzae NOT DETECTED NOT DETECTED Final   Neisseria meningitidis NOT DETECTED NOT DETECTED Final   Pseudomonas aeruginosa NOT DETECTED NOT DETECTED Final   Stenotrophomonas maltophilia NOT DETECTED NOT DETECTED Final   Candida albicans NOT DETECTED NOT DETECTED Final   Candida auris NOT DETECTED NOT DETECTED Final   Candida glabrata NOT DETECTED NOT DETECTED Final   Candida krusei NOT DETECTED NOT DETECTED Final    Candida parapsilosis NOT DETECTED NOT DETECTED Final   Candida tropicalis NOT DETECTED NOT DETECTED Final   Cryptococcus neoformans/gattii NOT DETECTED NOT DETECTED Final   CTX-M ESBL NOT DETECTED NOT DETECTED Final   Carbapenem resistance IMP NOT DETECTED NOT DETECTED Final   Carbapenem resistance KPC NOT DETECTED NOT DETECTED Final   Carbapenem resistance NDM NOT DETECTED NOT DETECTED Final   Carbapenem resist OXA 48 LIKE NOT DETECTED NOT DETECTED Final   Carbapenem resistance VIM NOT DETECTED NOT DETECTED Final    Comment: Performed at [facility], [address] [place], [place] [address]  Blood culture (routine x 2)     Status: Abnormal   Collection Time: 06/04/24  3:20 PM   Specimen: BLOOD  Result Value Ref Range Status   Specimen Description   Final    BLOOD RIGHT ARM Performed at [facility], [address] [place], [place] [address]    Special Requests   Final    BOTTLES DRAWN AEROBIC AND ANAEROBIC Blood Culture results may not be optimal due to an inadequate volume of blood received in culture bottles Performed at [facility], [address] [place], [place] [address]    Culture  Setup Time   Final    GRAM NEGATIVE RODS IN BOTH AEROBIC AND ANAEROBIC BOTTLES CRITICAL VALUE NOTED.  VALUE IS CONSISTENT WITH PREVIOUSLY REPORTED AND CALLED VALUE. Performed at [facility], [address] [place], [place] [address]    Culture (A)  Final    ESCHERICHIA COLI SUSCEPTIBILITIES PERFORMED ON PREVIOUS CULTURE WITHIN THE LAST 5 DAYS. Performed at [facility], [address], [place], [place] [address]    Report Status 06/07/2024 FINAL  Final  Respiratory (~20 pathogens) panel by PCR     Status: None   Collection Time: 06/05/24  4:51 PM   Specimen: Nasopharyngeal Swab; Respiratory  Result Value Ref Range Status   Adenovirus NOT DETECTED NOT DETECTED Final   Coronavirus 229E NOT DETECTED NOT DETECTED Final    Comment:  (NOTE) The Coronavirus on the Respiratory Panel, DOES NOT test for the novel  Coronavirus (2019 nCoV)    Coronavirus HKU1 NOT DETECTED NOT DETECTED Final   Coronavirus NL63 NOT DETECTED NOT DETECTED Final   Coronavirus OC43 NOT DETECTED NOT DETECTED Final   Metapneumovirus NOT DETECTED NOT DETECTED  Final   Rhinovirus / Enterovirus NOT DETECTED NOT DETECTED Final   Influenza A NOT DETECTED NOT DETECTED Final   Influenza B NOT DETECTED NOT DETECTED Final   Parainfluenza Virus 1 NOT DETECTED NOT DETECTED Final   Parainfluenza Virus 2 NOT DETECTED NOT DETECTED Final   Parainfluenza Virus 3 NOT DETECTED NOT DETECTED Final   Parainfluenza Virus 4 NOT DETECTED NOT DETECTED Final   Respiratory Syncytial Virus NOT DETECTED NOT DETECTED Final   Bordetella pertussis NOT DETECTED NOT DETECTED Final   Bordetella Parapertussis NOT DETECTED NOT DETECTED Final   Chlamydophila pneumoniae NOT DETECTED NOT DETECTED Final   Mycoplasma pneumoniae NOT DETECTED NOT DETECTED Final    Comment: Performed at [facility], [address] [place], [place] [address]  Culture, blood (single) w Reflex to ID Panel     Status: None (Preliminary result)   Collection Time: 06/07/24  1:26 PM   Specimen: BLOOD LEFT HAND  Result Value Ref Range Status   Specimen Description BLOOD LEFT HAND  Final   Special Requests   Final    BOTTLES DRAWN AEROBIC AND ANAEROBIC Blood Culture adequate volume   Culture   Final    NO GROWTH 2 DAYS Performed at [facility], [address] [place], [place] [address]    Report Status PENDING  Incomplete   *Note: Due to a large number of results and/or encounters for the requested time period, some results have not been displayed. A complete set of results can be found in Results Review.    Labs: CBC: Recent Labs  Lab 06/05/24 0215 06/06/24 0523 06/07/24 0331 06/08/24 0438 06/09/24 0516  WBC 42.7* 25.1* 14.9* 14.9* 12.2*  HGB 11.5* 11.7* 11.3* 12.9 13.0  HCT  36.9 39.1 37.6 41.6 43.5  MCV 92.5 94.9 95.2 92.2 93.1  PLT 136* 181 232 282 284   Basic Metabolic Panel: Recent Labs  Lab 06/05/24 0215 06/05/24 1726 06/06/24 0523 06/07/24 0331 06/08/24 0438 06/08/24 1807 06/09/24 0516  NA 136  --  141 140 140  --  141  K 2.9*   < > 4.4 4.5 2.8* 4.4 4.3  CL 103  --  109 105 94*  --  97*  CO2 23  --  23 25 33*  --  32  GLUCOSE 180*  --  116* 135* 108*  --  131*  BUN 21  --  25* 21 32*  --  28*  CREATININE 1.13*  --  1.05* 0.78 1.02*  --  0.92  CALCIUM  9.0  --  9.7 9.7 8.8*  --  9.1  MG 2.4  --  2.7* 2.2 2.1  --  2.6*   < > = values in this interval not displayed.   Liver Function Tests: Recent Labs  Lab 06/04/24 1358  AST 29  ALT 39  ALKPHOS 109  BILITOT 0.9  PROT 7.0  ALBUMIN 3.4*   CBG: Recent Labs  Lab 06/08/24 0805  GLUCAP 110*    Discharge time spent: greater than 30 minutes.  Signed: Laree Lock, MD [facility] 06/09/2024

## 2024-06-11 ENCOUNTER — Ambulatory Visit: Admitting: Pain Medicine

## 2024-06-12 LAB — CULTURE, BLOOD (SINGLE)
Culture: NO GROWTH
Special Requests: ADEQUATE

## 2024-07-22 ENCOUNTER — Ambulatory Visit: Attending: Nurse Practitioner | Admitting: Nurse Practitioner

## 2024-07-22 ENCOUNTER — Encounter: Payer: Self-pay | Admitting: Nurse Practitioner

## 2024-07-22 DIAGNOSIS — M25552 Pain in left hip: Secondary | ICD-10-CM | POA: Insufficient documentation

## 2024-07-22 DIAGNOSIS — Z79891 Long term (current) use of opiate analgesic: Secondary | ICD-10-CM | POA: Insufficient documentation

## 2024-07-22 DIAGNOSIS — M545 Low back pain, unspecified: Secondary | ICD-10-CM | POA: Insufficient documentation

## 2024-07-22 DIAGNOSIS — M79605 Pain in left leg: Secondary | ICD-10-CM | POA: Insufficient documentation

## 2024-07-22 DIAGNOSIS — G894 Chronic pain syndrome: Secondary | ICD-10-CM | POA: Insufficient documentation

## 2024-07-22 DIAGNOSIS — M79604 Pain in right leg: Secondary | ICD-10-CM | POA: Diagnosis present

## 2024-07-22 DIAGNOSIS — M25551 Pain in right hip: Secondary | ICD-10-CM | POA: Insufficient documentation

## 2024-07-22 DIAGNOSIS — G8929 Other chronic pain: Secondary | ICD-10-CM | POA: Diagnosis present

## 2024-07-22 DIAGNOSIS — M47816 Spondylosis without myelopathy or radiculopathy, lumbar region: Secondary | ICD-10-CM | POA: Diagnosis not present

## 2024-07-22 DIAGNOSIS — M25562 Pain in left knee: Secondary | ICD-10-CM | POA: Diagnosis present

## 2024-07-22 MED ORDER — HYDROCODONE-ACETAMINOPHEN 5-325 MG PO TABS: 1.0000 | ORAL_TABLET | Freq: Three times a day (TID) | ORAL | 0 refills | Status: DC | PRN

## 2024-07-22 MED ORDER — NALOXONE HCL 4 MG/0.1ML NA LIQD: 1.0000 | NASAL | 0 refills | Status: AC | PRN

## 2024-07-22 NOTE — Progress Notes (Signed)
 Nursing Pain Medication Assessment:  Safety precautions to be maintained throughout the outpatient stay will include: orient to surroundings, keep bed in low position, maintain call bell within reach at all times, provide assistance with transfer out of bed and ambulation.  Medication Inspection Compliance: Pill count conducted under aseptic conditions, in front of the patient. Neither the pills nor the bottle was removed from the patient's sight at any time. Once count was completed pills were immediately returned to the patient in their original bottle.  Medication: Hydrocodone /APAP Pill/Patch Count: 11 of 90 pills/patches remain Pill/Patch Appearance: Markings consistent with prescribed medication Bottle Appearance: Standard pharmacy container. Clearly labeled. Filled Date: 09 / 08 / 2025 Last Medication intake:  TodaySafety precautions to be maintained throughout the outpatient stay will include: orient to surroundings, keep bed in low position, maintain call bell within reach at all times, provide assistance with transfer out of bed and ambulation.

## 2024-07-22 NOTE — Progress Notes (Signed)
 PROVIDER NOTE: Interpretation of information contained herein should be left to medically-trained personnel. Specific patient instructions are provided elsewhere under Patient Instructions section of medical record. This document was created in part using AI and STT-dictation technology, any transcriptional errors that may result from this process are unintentional.  Patient: Theresa [name]  Service: E/M   PCP: Odell Chard, Edra GRADE, MD  DOB: 05/05/1956  DOS: 07/22/2024  Provider: Emmy MARLA Blanch, NP  MRN: [id number]  Delivery: Face-to-face  Specialty: Interventional Pain Management  Type: Established Patient  Setting: Ambulatory outpatient facility  Specialty designation: 09  Referring Prov.: Odell Chard, Consuelo *  Location: Outpatient office facility       History of present illness (HPI) Theresa [name], a 68 y.o. year old female, is here today because of her Low back pain. Theresa [name] primary complain today is Back Pain (lower)  Pertinent problems: Theresa [name] has Neuritis or radiculitis due to rupture of lumbar intervertebral disc; DDD (degenerative disc disease), lumbar; Carpal tunnel syndrome; Neurogenic pain; Neuropathic pain; Musculoskeletal pain; Lumbar spondylosis (L4-L5); Chronic low back pain (1ry area of pain) (Bilateral) (R>L) w/o sciatica; Chronic lower extremity pain (2ry area of pain) (Left); and Chronic pain syndrome on their pertinent problem list.  Pain Assessment: Severity of   is reported as a 8 /10. Location: Back Lower/right leg to back of the knee. Onset: More than 6 month ago. Quality: Aching. Timing: Constant. Modifying factor(s): medications, Ibuprofen , Salon Pas , topicals. Vitals:  height is 5' 2 (1.575 m) and weight is 205 lb (93 kg). Her oral temperature is 97.4 F (36.3 C) (abnormal). Her blood pressure is 123/58 (abnormal) and her pulse is 87. Her respiration is 16 and oxygen  saturation is 93%.  BMI: Estimated body mass index is 37.49 kg/m as calculated  from the following:   Height as of this encounter: 5' 2 (1.575 m).   Weight as of this encounter: 205 lb (93 kg).  Last encounter: 04/23/2024. Last procedure: Visit date not found.  Reason for encounter: medication management. The patient indicates doing well with current medication regimen. No side effects or adverse reaction reported to medication.  The patient experiences right shoulder pain radiating to to the upper arm, extending just below the elbow, which limits her ability to lift her right arm.  She also experiencing low back pain, however, she reports a current pain medication regimen continues helping to manage her functional activities and provide pain relief.  Pharmacotherapy Assessment   Hydrocodone -acetaminophen  (Norco/Vicodin) 5-325 mg tablet every 8 hours as needed for pain. MME=15 Monitoring: [place] PMP: PDMP reviewed during this encounter.       Pharmacotherapy: No side-effects or adverse reactions reported. Compliance: No problems identified. Effectiveness: Clinically acceptable.  Theresa Reda CROME, RN  07/22/2024  9:52 AM  Sign when Signing Visit Nursing Pain Medication Assessment:  Safety precautions to be maintained throughout the outpatient stay will include: orient to surroundings, keep bed in low position, maintain call bell within reach at all times, provide assistance with transfer out of bed and ambulation.  Medication Inspection Compliance: Pill count conducted under aseptic conditions, in front of the patient. Neither the pills nor the bottle was removed from the patient's sight at any time. Once count was completed pills were immediately returned to the patient in their original bottle.  Medication: Hydrocodone /APAP Pill/Patch Count: 11 of 90 pills/patches remain Pill/Patch Appearance: Markings consistent with prescribed medication Bottle Appearance: Standard pharmacy container. Clearly labeled. Filled Date: 09 / 08 / 2025  Last Medication intake:  TodaySafety  precautions to be maintained throughout the outpatient stay will include: orient to surroundings, keep bed in low position, maintain call bell within reach at all times, provide assistance with transfer out of bed and ambulation.     UDS:  Summary  Date Value Ref Range Status  10/25/2023 FINAL  Final    Comment:    ==================================================================== [facility] 13 (MW) ==================================================================== Test                             Result       Flag       Units  Drug Present and Declared for Prescription Verification   Hydrocodone                     1227         EXPECTED   ng/mg creat   Hydromorphone                   103          EXPECTED   ng/mg creat   Dihydrocodeine                 146          EXPECTED   ng/mg creat   Norhydrocodone                 518          EXPECTED   ng/mg creat    Sources of hydrocodone  include scheduled prescription medications.    Hydromorphone , dihydrocodeine and norhydrocodone are expected    metabolites of hydrocodone . Hydromorphone  and dihydrocodeine are    also available as scheduled prescription medications.  ==================================================================== Test                      Result    Flag   Units      Ref Range   Creatinine              67               mg/dL      >=79 ==================================================================== Declared Medications:  The flagging and interpretation on this report are based on the  following declared medications.  Unexpected results may arise from  inaccuracies in the declared medications.   **Note: The testing scope of this panel includes these medications:   Hydrocodone  (Norco)   **Note: The testing scope of this panel does not include the  following reported medications:   Acetaminophen  (Tylenol )  Acetaminophen  (Norco)  Albuterol  (Ventolin  HFA)  Albuterol  (Duoneb)  Amiodarone  (Pacerone )   Buspirone  (Buspar )  Cholecalciferol   Dapagliflozin  (Farxiga )  Diltiazem  (Cardizem )  Fluticasone   Furosemide   Ipratropium (Duoneb)  Melatonin  Naloxone  (Narcan )  Omeprazole  (Prilosec)  Polyethylene Glycol (MiraLAX )  Potassium (Klor-Con )  Roflumilast  (Daliresp )  Semaglutide  (Wegovy )  Sertraline  (Zoloft )  Spironolactone  (Aldactone )  Topical Diclofenac  (Voltaren )  Torsemide  (Demadex )  Umeclidinium  Vilanterol ==================================================================== For clinical consultation, please call [phone]. ====================================================================     No results found for: CBDTHCR No results found for: D8THCCBX No results found for: D9THCCBX  ROS  Constitutional: Denies any fever or chills Gastrointestinal: No reported hemesis, hematochezia, vomiting, or acute GI distress Musculoskeletal: Low back pain  Neurological: No reported episodes of acute onset apraxia, aphasia, dysarthria, agnosia, amnesia, paralysis, loss of coordination, or loss of consciousness  Medication Review  Cholecalciferol , Fluticasone -Umeclidin-Vilant, HYDROcodone -acetaminophen , Oxygen -Helium, acetaminophen , albuterol , amiodarone , aspirin   EC, atorvastatin , dapagliflozin  propanediol, diltiazem , guaiFENesin , ipratropium-albuterol , naloxone , omeprazole , potassium chloride  SA, roflumilast , sertraline , spironolactone , tirzepatide , and torsemide   History Review  Allergy: Theresa [name] is allergic to gabapentin , codeine, and meloxicam . Drug: Theresa [name]  reports no history of drug use. Alcohol:  reports no history of alcohol use. Tobacco:  reports that she has quit smoking. Her smoking use included cigarettes. She has a 66 pack-year smoking history. She has never used smokeless tobacco. Social: Theresa [name]  reports that she has quit smoking. Her smoking use included cigarettes. She has a 66 pack-year smoking history. She has never used smokeless tobacco. She  reports that she does not drink alcohol and does not use drugs. Medical:  has a past medical history of Acute postoperative pain (10/03/2017), Anemia, Anxiety, Atrial fibrillation (HCC), BiPAP (biphasic positive airway pressure) dependence, Carpal tunnel syndrome, CHF (congestive heart failure) (HCC), CHF (congestive heart failure) (HCC), Chronic generalized abdominal pain, Chronic rhinitis, COPD (chronic obstructive pulmonary disease) (HCC), DDD (degenerative disc disease), cervical, DDD (degenerative disc disease), lumbosacral, Depression, Dyspnea, Edema, Flu, Gastritis, GERD (gastroesophageal reflux disease), Hematuria, Hernia of abdominal wall (07/17/2018), Hernia, abdominal, Hypertension, Kidney stones, Low back pain, Lumbar radiculitis, Malodorous urine, Muscle weakness, Obesity, Oxygen  dependent, Pneumonia due to COVID-19 virus (02/03/2020), Renal cyst, Sensory urge incontinence, Thyroid  activity decreased, Tobacco abuse, and Wheezing. Surgical: Ms. Stofko  has a past surgical history that includes Abdominal hysterectomy; Tonsillectomy; Carpal tunnel release (Left, 2012); Tubal ligation; epigastric hernia repair (N/A, 08/13/2018); Umbilical hernia repair (N/A, 08/13/2018); RIGHT/LEFT HEART CATH AND CORONARY ANGIOGRAPHY (N/A, 07/11/2019); TEE without cardioversion (N/A, 04/29/2022); Cardioversion (N/A, 04/29/2022); Esophagogastroduodenoscopy (N/A, 06/21/2022); cataract surgery (Bilateral); Cardioversion (N/A, 01/30/2024); Cardioversion (N/A, 02/19/2024); Flexible sigmoidoscopy (N/A, 03/05/2024); Esophagogastroduodenoscopy (N/A, 03/05/2024); Colonoscopy (N/A, 03/05/2024); Polypectomy (03/05/2024); and RIGHT HEART CATH (Right, 05/01/2024). Family: family history includes Alcohol abuse in her father; Breast cancer in her sister; Cancer in her brother, brother, and sister; Coronary artery disease in her mother; Heart disease in her father and mother; Kidney cancer in her brother; Lung cancer in her sister; Pneumonia  in her brother; Stroke in her mother.  Laboratory Chemistry Profile   Renal Lab Results  Component Value Date   BUN 28 (H) 06/09/2024   CREATININE 0.92 06/09/2024   BCR 20 06/05/2023   GFRAA >60 06/26/2020   GFRNONAA >60 06/09/2024    Hepatic Lab Results  Component Value Date   AST 29 06/04/2024   ALT 38 06/04/2024   ALBUMIN 3.4 (L) 06/04/2024   ALKPHOS 109 06/04/2024   LIPASE 57 (H) 06/19/2022   AMMONIA 40 (H) 10/11/2019    Electrolytes Lab Results  Component Value Date   NA 141 06/09/2024   K 4.3 06/09/2024   CL 97 (L) 06/09/2024   CALCIUM  9.1 06/09/2024   MG 2.6 (H) 06/09/2024   PHOS 3.2 06/21/2022    Bone Lab Results  Component Value Date   25OHVITD1 13 (L) 09/17/2015   25OHVITD2 <1.0 09/17/2015   25OHVITD3 13 09/17/2015    Inflammation (CRP: Acute Phase) (ESR: Chronic Phase) Lab Results  Component Value Date   CRP 0.9 04/02/2022   ESRSEDRATE 17 04/06/2022   LATICACIDVEN 1.1 06/04/2024         Note: Above Lab results reviewed.  Recent Imaging Review  US  RENAL CLINICAL DATA:  Flank pain.  EXAM: RENAL / URINARY TRACT ULTRASOUND COMPLETE  COMPARISON:  Ultrasound dated 09/08/2014.  FINDINGS: Right Kidney:  Renal measurements: 12.4 x 4.6 x 4.8 cm = volume: 142 mL. Normal echogenicity. No hydronephrosis or shadowing stone.  Small interpolar cyst.  Left Kidney:  Renal measurements: 13.1 x 6.0 x 4.3 cm = volume: 175 mL. Normal echogenicity. No hydronephrosis or shadowing stone.  Bladder:  Appears normal for degree of bladder distention.  Other:  None.  IMPRESSION: No hydronephrosis or shadowing stone.  Electronically Signed   By: Vanetta Chou M.D.   On: 06/08/2024 17:59 DG Chest 1 View CLINICAL DATA:  Shortness of breath.  COPD.  Atrial fibrillation.  EXAM: DG CHEST 1V  COMPARISON:  06/04/2024  FINDINGS: The heart size and mediastinal contours are within normal limits. Pulmonary hyperinflation again seen, consistent with  COPD. No evidence of pulmonary infiltrate or pleural effusion.  IMPRESSION: COPD. No active disease.  Electronically Signed   By: Norleen DELENA Kil M.D.   On: 06/08/2024 16:31 Note: Reviewed        Physical Exam  Vitals: BP (!) 123/58 (BP Location: Left Arm, Patient Position: Sitting, Cuff Size: Normal)   Pulse 87   Temp (!) 97.4 F (36.3 C) (Oral)   Resp 16   Ht 5' 2 (1.575 m)   Wt 205 lb (93 kg)   SpO2 93% Comment: O2 at 5 liters  BMI 37.49 kg/m  BMI: Estimated body mass index is 37.49 kg/m as calculated from the following:   Height as of this encounter: 5' 2 (1.575 m).   Weight as of this encounter: 205 lb (93 kg). Ideal: Ideal body weight: 50.1 kg (110 lb 7.2 oz) Adjusted ideal body weight: 67.3 kg (148 lb 4.3 oz) General appearance: Well nourished, well developed, and well hydrated. In no apparent acute distress Mental status: Alert, oriented x 3 (person, place, & time)       Respiratory: No evidence of acute respiratory distress Eyes: PERLA  Musculoskeletal: + Low back pain Assessment   Diagnosis Status  1. Chronic low back pain (1ry area of Pain) (Bilateral) (L>R) w/o sciatica   2. Chronic pain syndrome (significant psychosocial component)   3. Chronic use of opiate for therapeutic purpose   4. Chronic lower extremity pain (2ry area of Pain) (Left)   5. Chronic hip pain (3ry area of Pain) (Bilateral) (L>R)   6. Lumbar facet syndrome (Bilateral) (L>R)   7. Chronic knee pain (Left)   8. Chronic lower extremity pain (Bilateral) (L>R)    Controlled Controlled Controlled   Updated Problems: No problems updated.  Plan of Care  Problem-specific:  Assessment and Plan  Chronic pain syndrome: Patient's pain is controlled with hydrocodone -acetaminophen  (Norco) 5-325 mg, will continue on current medication regimen.  Prescribing drug monitoring (PDMP) reviewed, findings consistent with the use of prescribed medication and no evidence of narcotic misuse or abuse.  Urine  drug screening (UDS) up to date.  Schedule follow-up in 90 days for medication management.  Chronic low back pain: The patient continues to experience low back pain despite prior conservative and interventional treatments.  She declines repeating any interventional procedure at this time and is maintaining her current pain level with her prescribed medication regimen.  Theresa [name] has a current medication list which includes the following long-term medication(s): albuterol , amiodarone , ipratropium-albuterol , potassium chloride  sa, sertraline , spironolactone , torsemide , [START ON 07/24/2024] hydrocodone -acetaminophen , [START ON 08/23/2024] hydrocodone -acetaminophen , and [START ON 09/22/2024] hydrocodone -acetaminophen .  Pharmacotherapy (Medications Ordered): Meds ordered this encounter  Medications   HYDROcodone -acetaminophen  (NORCO/VICODIN) 5-325 MG tablet    Sig: Take 1 tablet by mouth every 8 (eight) hours as needed for severe pain (pain score 7-10). Must last 30 days    Dispense:  90 tablet    Refill:  0    DO NOT: delete (not duplicate); no partial-fill (will deny script to complete), no refill request (F/U required). DISPENSE: 1 day early if closed on fill date. WARN: No CNS-depressants within 8 hrs of med.   HYDROcodone -acetaminophen  (NORCO/VICODIN) 5-325 MG tablet    Sig: Take 1 tablet by mouth every 8 (eight) hours as needed for severe pain (pain score 7-10). Must last 30 days    Dispense:  90 tablet    Refill:  0    DO NOT: delete (not duplicate); no partial-fill (will deny script to complete), no refill request (F/U required). DISPENSE: 1 day early if closed on fill date. WARN: No CNS-depressants within 8 hrs of med.   HYDROcodone -acetaminophen  (NORCO/VICODIN) 5-325 MG tablet    Sig: Take 1 tablet by mouth every 8 (eight) hours as needed for severe pain (pain score 7-10). Must last 30 days    Dispense:  90 tablet    Refill:  0    DO NOT: delete (not duplicate); no partial-fill  (will deny script to complete), no refill request (F/U required). DISPENSE: 1 day early if closed on fill date. WARN: No CNS-depressants within 8 hrs of med.   naloxone  (NARCAN ) nasal spray 4 mg/0.1 mL    Sig: Place 1 spray into the nose as needed for up to 365 doses (for opioid-induced respiratory depresssion). In case of emergency (overdose), spray once into each nostril. If no response within 3 minutes, repeat application and call 911.    Dispense:  1 each    Refill:  0    Instruct patient in proper use of device.   Orders:  No orders of the defined types were placed in this encounter.       Return in about 3 months (around 10/22/2024) for (F2F), (MM), Emmy Blanch NP.    Recent Visits Date Type Provider Dept  05/13/24 Office Visit Tanya Glisson, MD [facility]  04/23/24 Office Visit Nickson Middlesworth K, NP [facility]  Showing recent visits within past 90 days and meeting all other requirements Today's Visits Date Type Provider Dept  07/22/24 Office Visit Lucan Riner K, NP [facility]  Showing today's visits and meeting all other requirements Future Appointments Date Type Provider Dept  08/08/24 Appointment Tanya Glisson, MD [facility]  10/14/24 Appointment Emalea Mix K, NP [facility]  Showing future appointments within next 90 days and meeting all other requirements  I discussed the assessment and treatment plan with the patient. The patient was provided an opportunity to ask questions and all were answered. The patient agreed with the plan and demonstrated an understanding of the instructions.  Patient advised to call back or seek an in-person evaluation if the symptoms or condition worsens.  I personally spent a total of 30 minutes in the care of the patient today including preparing to see the patient, getting/reviewing separately obtained history, performing a medically appropriate exam/evaluation, counseling and  educating, placing orders, referring and communicating with other health care professionals, documenting clinical information in the EHR, independently interpreting results, communicating results, and coordinating care.   Note by: Emmy MARLA Blanch, NP  Date: 07/22/2024; Time: 12:58 PM

## 2024-08-07 ENCOUNTER — Encounter: Payer: Self-pay | Admitting: Family Medicine

## 2024-08-07 ENCOUNTER — Encounter: Admitting: Family

## 2024-08-08 ENCOUNTER — Ambulatory Visit: Admitting: Pain Medicine

## 2024-08-09 ENCOUNTER — Other Ambulatory Visit: Payer: Self-pay | Admitting: Family Medicine

## 2024-08-09 DIAGNOSIS — Z1231 Encounter for screening mammogram for malignant neoplasm of breast: Secondary | ICD-10-CM

## 2024-08-23 ENCOUNTER — Other Ambulatory Visit: Payer: Self-pay | Admitting: Family

## 2024-08-29 ENCOUNTER — Encounter

## 2024-08-29 ENCOUNTER — Other Ambulatory Visit

## 2024-09-17 ENCOUNTER — Inpatient Hospital Stay: Attending: Oncology

## 2024-09-17 DIAGNOSIS — D509 Iron deficiency anemia, unspecified: Secondary | ICD-10-CM | POA: Insufficient documentation

## 2024-09-19 ENCOUNTER — Inpatient Hospital Stay: Admitting: Oncology

## 2024-09-19 ENCOUNTER — Encounter: Payer: Self-pay | Admitting: Oncology

## 2024-09-19 ENCOUNTER — Inpatient Hospital Stay

## 2024-09-19 ENCOUNTER — Ambulatory Visit: Payer: Self-pay | Admitting: Oncology

## 2024-09-19 VITALS — BP 131/55 | HR 77 | Temp 97.6°F | Resp 17 | Wt 212.5 lb

## 2024-09-19 DIAGNOSIS — D500 Iron deficiency anemia secondary to blood loss (chronic): Secondary | ICD-10-CM

## 2024-09-19 DIAGNOSIS — D509 Iron deficiency anemia, unspecified: Secondary | ICD-10-CM | POA: Diagnosis present

## 2024-09-19 LAB — RETIC PANEL
Immature Retic Fract: 23 % — ABNORMAL HIGH (ref 2.3–15.9)
RBC.: 4.21 MIL/uL (ref 3.87–5.11)
Retic Count, Absolute: 83.4 K/uL (ref 19.0–186.0)
Retic Ct Pct: 2 % (ref 0.4–3.1)
Reticulocyte Hemoglobin: 29.1 pg (ref >27.9)

## 2024-09-19 LAB — IRON AND TIBC
Iron: 27 ug/dL — ABNORMAL LOW (ref 28–170)
Saturation Ratios: 6 % — ABNORMAL LOW (ref 10.4–31.8)
TIBC: 438 ug/dL (ref 250–450)
UIBC: 411 ug/dL

## 2024-09-19 LAB — CBC WITH DIFFERENTIAL (CANCER CENTER ONLY)
Abs Immature Granulocytes: 0.06 K/uL (ref 0.00–0.07)
Basophils Absolute: 0 K/uL (ref 0.0–0.1)
Basophils Relative: 0 %
Eosinophils Absolute: 0.4 K/uL (ref 0.0–0.5)
Eosinophils Relative: 4 %
HCT: 38.5 % (ref 36.0–46.0)
Hemoglobin: 12 g/dL (ref 12.0–15.0)
Immature Granulocytes: 1 %
Lymphocytes Relative: 8 %
Lymphs Abs: 0.9 K/uL (ref 0.7–4.0)
MCH: 28.4 pg (ref 26.0–34.0)
MCHC: 31.2 g/dL (ref 30.0–36.0)
MCV: 91 fL (ref 80.0–100.0)
Monocytes Absolute: 0.6 K/uL (ref 0.1–1.0)
Monocytes Relative: 5 %
Neutro Abs: 8.9 K/uL — ABNORMAL HIGH (ref 1.7–7.7)
Neutrophils Relative %: 82 %
Platelet Count: 218 K/uL (ref 150–400)
RBC: 4.23 MIL/uL (ref 3.87–5.11)
RDW: 16.1 % — ABNORMAL HIGH (ref 11.5–15.5)
WBC Count: 10.8 K/uL — ABNORMAL HIGH (ref 4.0–10.5)
nRBC: 0 % (ref 0.0–0.2)

## 2024-09-19 LAB — FERRITIN: Ferritin: 63 ng/mL (ref 11–307)

## 2024-09-19 NOTE — Assessment & Plan Note (Addendum)
 Lab Results  Component Value Date   HGB 12.0 09/19/2024   TIBC 438 09/19/2024   IRONPCTSAT 6 (L) 09/19/2024   FERRITIN 63 09/19/2024   Decrease iron  saturation  Recommend weekly Venofer  x 2

## 2024-09-19 NOTE — Progress Notes (Signed)
 Hematology/Oncology Progress note [phone] [phone]     CHIEF COMPLAINTS/REASON FOR VISIT:  Iron  deficiency anemia  ASSESSMENT & PLAN:   Iron  deficiency anemia due to chronic blood loss Lab Results  Component Value Date   HGB 12.0 09/19/2024   TIBC 438 09/19/2024   IRONPCTSAT 6 (L) 09/19/2024   FERRITIN 63 09/19/2024   Decrease iron  saturation  Recommend weekly Venofer  x 2   Follow-up in 6 months. All questions were answered. The patient knows to call the clinic with any problems, questions or concerns.  Zelphia Cap, MD, PhD [facility] Hematology Oncology 09/19/2024     HISTORY OF PRESENTING ILLNESS:  Theresa [name] is a  68 y.o.  female with PMH listed below who was referred to me for anemia Reviewed patient's recent labs that was done.  She was found to have anemia since September 2023,  acute blood loss anemia Hb 7.4. She was on Xarelto  for A Fib and Aspirin  at that time, anticoagulation was held. She was transfused with PRBCs She underwent EGD, fount to have gastritis, non bleeding duodenal ulcers. Recently she follows up with Dr.Toledo and had repeat blood work on 09/19/22, which still showed persistent anemia with hemoglobin of 7.1, 09/27/2022 Ferritin 19, TIBC 578,  She has chronic respiratory failure due to pulmonary hypertension, on nasal cannula oxygen , follows ups with pulmonology.   She was off Xarelto  for about a year and resumed 2-3 months ago. Patient was recently hospitalized due to acute anemia due to GI bleeding.  Xarelto  was held. 03/05/2024, status post colonoscopy with polypectomy.  She had moderate bleeding afterwards and had a flex sigmoidoscopy with cautery and clip placement. Patient was transfused with PRBC and Venofer  treatments.  Hemoglobin improved to 8.3 and was discharged home.  INTERVAL HISTORY Theresa [name] is a 68 y.o. female who has above history reviewed by me today presents for follow up visit for iron   deficiency anemia.  Patient tolerated IV Venofer  treatments.  Fatigue is slightly better.  She is currently off Xarelto  due to bleeding tendency.  She will see expert for evaluation of Watchman She had a fall last week and has had bruising and abrasion on her right lower extremity and right upper arm.    MEDICAL HISTORY:  Past Medical History:  Diagnosis Date   Acute postoperative pain 10/03/2017   Anemia    Anxiety    Atrial fibrillation (HCC)    BiPAP (biphasic positive airway pressure) dependence    at hs   Carpal tunnel syndrome    CHF (congestive heart failure) (HCC)    1/18   CHF (congestive heart failure) (HCC)    Chronic generalized abdominal pain    Chronic rhinitis    COPD (chronic obstructive pulmonary disease) (HCC)    DDD (degenerative disc disease), cervical    DDD (degenerative disc disease), lumbosacral    Depression    Dyspnea    Edema    Flu    1/18   Gastritis    GERD (gastroesophageal reflux disease)    Hematuria    Hernia of abdominal wall 07/17/2018   Hernia, abdominal    Hypertension    Kidney stones    Low back pain    Lumbar radiculitis    Malodorous urine    Muscle weakness    Obesity    Oxygen  dependent    Pneumonia due to COVID-19 virus 02/03/2020   Renal cyst    Sensory urge incontinence    Thyroid  activity decreased  9/19   Tobacco abuse    Wheezing     SURGICAL HISTORY: Past Surgical History:  Procedure Laterality Date   ABDOMINAL HYSTERECTOMY     CARDIOVERSION N/A 04/29/2022   Procedure: CARDIOVERSION;  Surgeon: Hester Wolm PARAS, MD;  Location: [facility] OR[facility];  Service: Cardiovascular;  Laterality: N/A;   CARDIOVERSION N/A 01/30/2024   Procedure: CARDIOVERSION;  Surgeon: Florencio Cara BIRCH, MD;  Location: [facility];  Service: Cardiovascular;  Laterality: N/A;   CARDIOVERSION N/A 02/19/2024   Procedure: CARDIOVERSION;  Surgeon: Florencio Cara BIRCH, MD;  Location: [facility];  Service: Cardiovascular;  Laterality: N/A;   CARPAL TUNNEL  RELEASE Left 2012   cataract surgery Bilateral    COLONOSCOPY N/A 03/05/2024   Procedure: COLONOSCOPY;  Surgeon: Jinny Carmine, MD;  Location: [facility];  Service: Endoscopy;  Laterality: N/A;   EPIGASTRIC HERNIA REPAIR N/A 08/13/2018   HERNIA REPAIR EPIGASTRIC ADULT; polypropylene mesh reinforcement.  Surgeon: Dessa Reyes ORN, MD;  Location: [facility];  Service: General;  Laterality: N/A;   ESOPHAGOGASTRODUODENOSCOPY N/A 06/21/2022   Procedure: ESOPHAGOGASTRODUODENOSCOPY (EGD);  Surgeon: Toledo, Ladell POUR, MD;  Location: [facility] END[facility];  Service: Gastroenterology;  Laterality: N/A;   ESOPHAGOGASTRODUODENOSCOPY N/A 03/05/2024   Procedure: EGD (ESOPHAGOGASTRODUODENOSCOPY);  Surgeon: Jinny Carmine, MD;  Location: [facility] END[facility]COPY;  Service: Endoscopy;  Laterality: N/A;   FLEXIBLE SIGMOIDOSCOPY N/A 03/05/2024   Procedure: KINGSTON SIDE;  Surgeon: Jinny Carmine, MD;  Location: [facility] ENDOS[facility];  Service: Endoscopy;  Laterality: N/A;   POLYPECTOMY  03/05/2024   Procedure: POLYPECTOMY, INTESTINE;  Surgeon: Jinny Carmine, MD;  Location: [facility];  Service: Endoscopy;;   RIGHT HEART CATH Right 05/01/2024   Procedure: RIGHT HEART CATH;  Surgeon: Florencio Cara BIRCH, MD;  Location: [facility];  Service: Cardiovascular;  Laterality: Right;   RIGHT/LEFT HEART CATH AND CORONARY ANGIOGRAPHY N/A 07/11/2019   Procedure: RIGHT/LEFT HEART CATH AND CORONARY ANGIOGRAPHY;  Surgeon: Florencio Cara BIRCH, MD;  Location: [facility];  Service: Cardiovascular;  Laterality: N/A;   TEE WITHOUT CARDIOVERSION N/A 04/29/2022   Procedure: TRANSESOPHAGEAL ECHOCARDIOGRAM (TEE);  Surgeon: Hester Wolm PARAS, MD;  Location: [facility];  Service: Cardiovascular;  Laterality: N/A;   TONSILLECTOMY     TUBAL LIGATION     UMBILICAL HERNIA REPAIR N/A 08/13/2018   HERNIA REPAIR UMBILICAL ADULT; polypropylene mesh reinforcement Surgeon: Dessa Reyes ORN, MD;  Location: [facility];  Service: General;  Laterality:  N/A;    SOCIAL HISTORY: Social History   Socioeconomic History   Marital status: Married    Spouse name: Rankin   Number of children: 2   Years of education: Not on file   Highest education level: 12th grade  Occupational History   Occupation: disability  Tobacco Use   Smoking status: Former    Current packs/day: 1.50    Average packs/day: 1.5 packs/day for 44.0 years (66.0 ttl pk-yrs)    Types: Cigarettes   Smokeless tobacco: Never   Tobacco comments:    Stopped 4 weeks ago May 20, 2023  Vaping Use   Vaping status: Never Used  Substance and Sexual Activity   Alcohol use: No   Drug use: Never    Comment: prescribed hydrocodone    Sexual activity: Not Currently    Birth control/protection: Surgical  Other Topics Concern   Not on file  Social History Narrative   ** Merged History Encounter **       Lives with spouse    Social Drivers of Health   Financial Resource Strain: Medium Risk (02/26/2024)  Received from [facility]   Overall Financial Resource Strain (CARDIA)    Difficulty of Paying Living Expenses: Somewhat hard  Food Insecurity: No Food Insecurity (06/05/2024)   Hunger Vital Sign    Worried About Running Out of Food in the Last Year: Never true    Ran Out of Food in the Last Year: Never true  Transportation Needs: No Transportation Needs (06/05/2024)   PRAPARE - Administrator, Civil Service (Medical): No    Lack of Transportation (Non-Medical): No  Physical Activity: Inactive (08/24/2022)   Received from [facility]   Exercise Vital Sign    On average, how many days per week do you engage in moderate to strenuous exercise (like a brisk walk)?: 0 days    On average, how many minutes do you engage in exercise at this level?: 0 min  Stress: Stress Concern Present (08/24/2022)   Received from [facility] - Occupational Stress Questionnaire    Feeling  of Stress : Very much  Social Connections: Moderately Integrated (06/05/2024)   Social Connection and Isolation Panel    Frequency of Communication with Friends and Family: More than three times a week    Frequency of Social Gatherings with Friends and Family: Once a week    Attends Religious Services: More than 4 times per year    Active Member of [employer] or Organizations: No    Attends Banker Meetings: Never    Marital Status: Married  Catering Manager Violence: Not At Risk (06/05/2024)   Humiliation, Afraid, Rape, and Kick questionnaire    Fear of Current or Ex-Partner: No    Emotionally Abused: No    Physically Abused: No    Sexually Abused: No    FAMILY HISTORY: Family History  Problem Relation Age of Onset   Heart disease Mother    Stroke Mother    Coronary artery disease Mother    Alcohol abuse Father    Heart disease Father    Lung cancer Sister    Cancer Sister    Breast cancer Sister    Cancer Brother    Cancer Brother    Kidney cancer Brother    Pneumonia Brother    Prostate cancer Neg Hx    Bladder Cancer Neg Hx     ALLERGIES:  is allergic to gabapentin , codeine, and meloxicam .  MEDICATIONS:  Current Outpatient Medications  Medication Sig Dispense Refill   acetaminophen  (TYLENOL ) 500 MG tablet Take 1,000 mg by mouth every 8 (eight) hours as needed for mild pain or headache.     albuterol  (VENTOLIN  HFA) 108 (90 Base) MCG/ACT inhaler Inhale 2 puffs into the lungs every 6 (six) hours as needed for wheezing or shortness of breath. 8 g 1   amiodarone  (PACERONE ) 200 MG tablet Take 1 tablet (200 mg total) by mouth daily. 30 tablet 0   aspirin  EC 81 MG tablet Take 81 mg by mouth daily. Swallow whole.     atorvastatin  (LIPITOR) 20 MG tablet 1 tablet Orally Once a day for 90 days     Cholecalciferol  50 MCG (2000 UT) CAPS Take 2,000 Units by mouth daily.     dapagliflozin  propanediol (FARXIGA ) 10 MG TABS tablet TAKE 1 TABLET BY MOUTH ONCE DAILY 90 tablet 1    diltiazem  (CARDIZEM  CD) 180 MG 24 hr capsule Take 180 mg by mouth daily.     Fluticasone -Umeclidin-Vilant (TRELEGY ELLIPTA) 100-62.5-25 MCG/ACT AEPB Inhale  1 puff into the lungs daily.     HYDROcodone -acetaminophen  (NORCO/VICODIN) 5-325 MG tablet Take 1 tablet by mouth every 8 (eight) hours as needed for severe pain (pain score 7-10). Must last 30 days 90 tablet 0   HYDROcodone -acetaminophen  (NORCO/VICODIN) 5-325 MG tablet Take 1 tablet by mouth every 8 (eight) hours as needed for severe pain (pain score 7-10). Must last 30 days 90 tablet 0   [START ON 09/22/2024] HYDROcodone -acetaminophen  (NORCO/VICODIN) 5-325 MG tablet Take 1 tablet by mouth every 8 (eight) hours as needed for severe pain (pain score 7-10). Must last 30 days 90 tablet 0   ipratropium-albuterol  (DUONEB) 0.5-2.5 (3) MG/3ML SOLN USE 1 VIAL VIA NEBULIZER EVERY 6 HOURS AS NEEDED FOR SHORTNESS OF BREATH OR WHEEZING 1080 mL 0   MOUNJARO  2.5 MG/0.5ML Pen 2. 5 mg Subcutaneous weekly for 30 days     naloxone  (NARCAN ) nasal spray 4 mg/0.1 mL Place 1 spray into the nose as needed for up to 365 doses (for opioid-induced respiratory depresssion). In case of emergency (overdose), spray once into each nostril. If no response within 3 minutes, repeat application and call 911. 1 each 0   omeprazole  (PRILOSEC) 40 MG capsule Take 40 mg by mouth daily.     OXYGEN  Inhale 5 L into the lungs continuous.     potassium chloride  SA (KLOR-CON  M) 20 MEQ tablet Take 2 tablets every morning and 1 tablet every evening 270 tablet 3   roflumilast  (DALIRESP ) 500 MCG TABS tablet Take 500 mcg by mouth daily.     sertraline  (ZOLOFT ) 100 MG tablet Take 2 tablets (200 mg total) by mouth daily. Please schedule office visit before any future refill. 60 tablet 0   spironolactone  (ALDACTONE ) 25 MG tablet TAKE 1/2 TABLET BY MOUTH ONCE DAILY 45 tablet 3   sulfamethoxazole -trimethoprim  (BACTRIM  DS) 800-160 MG tablet Take 1 tablet by mouth 2 (two) times daily.     torsemide   (DEMADEX ) 20 MG tablet Take 2-4 tablets (40-80 mg total) by mouth See admin instructions. 80 mg in the morning, 40 mg in the evening     No current facility-administered medications for this visit.    Review of Systems  Constitutional:  Positive for fatigue. Negative for chills and fever.  HENT:   Negative for hearing loss and voice change.   Eyes:  Negative for eye problems.  Respiratory:  Negative for chest tightness, cough and shortness of breath.   Cardiovascular:  Negative for chest pain.  Gastrointestinal:  Negative for abdominal distention, abdominal pain and blood in stool.  Endocrine: Negative for hot flashes.  Genitourinary:  Negative for difficulty urinating and frequency.   Musculoskeletal:  Negative for arthralgias.  Skin:  Negative for itching and rash.  Neurological:  Negative for extremity weakness.  Hematological:  Negative for adenopathy. Does not bruise/bleed easily.  Psychiatric/Behavioral:  Negative for confusion.     PHYSICAL EXAMINATION: Vitals:   09/19/24 1449  BP: (!) 131/55  Pulse: 77  Resp: 17  Temp: 97.6 F (36.4 C)  SpO2: 97%   Filed Weights   09/19/24 1449  Weight: 212 lb 8 oz (96.4 kg)    Physical Exam Constitutional:      General: She is not in acute distress.    Appearance: She is obese.     Comments: Patient sits in the wheelchair  HENT:     Head: Normocephalic and atraumatic.  Eyes:     General: No scleral icterus. Cardiovascular:     Rate and Rhythm: Normal rate.  Pulmonary:     Effort: Pulmonary effort is normal. No respiratory distress.     Breath sounds: No wheezing.     Comments: Patient breathes comfortably via nasal cannula oxygen .  Decreased breath sound bilaterally Abdominal:     General: Bowel sounds are normal. There is no distension.     Palpations: Abdomen is soft.  Musculoskeletal:        General: No deformity. Normal range of motion.     Cervical back: Normal range of motion and neck supple.  Skin:     General: Skin is warm and dry.     Findings: Bruising present. No erythema or rash.  Neurological:     Mental Status: She is alert and oriented to person, place, and time. Mental status is at baseline.     Cranial Nerves: No cranial nerve deficit.  Psychiatric:        Mood and Affect: Mood normal.      LABORATORY DATA:  I have reviewed the data as listed    Latest Ref Rng & Units 09/19/2024    2:30 PM 06/09/2024    5:16 AM 06/08/2024    4:38 AM  CBC  WBC 4.0 - 10.5 K/uL 10.8  12.2  14.9   Hemoglobin 12.0 - 15.0 g/dL 87.9  86.9  87.0   Hematocrit 36.0 - 46.0 % 38.5  43.5  41.6   Platelets 150 - 400 K/uL 218  284  282       Latest Ref Rng & Units 06/09/2024    5:16 AM 06/08/2024    6:07 PM 06/08/2024    4:38 AM  CMP  Glucose 70 - 99 mg/dL 868   891   BUN 8 - 23 mg/dL 28   32   Creatinine 9.55 - 1.00 mg/dL 9.07   8.97   Sodium 864 - 145 mmol/L 141   140   Potassium 3.5 - 5.1 mmol/L 4.3  4.4  2.8   Chloride 98 - 111 mmol/L 97   94   CO2 22 - 32 mmol/L 32   33   Calcium  8.9 - 10.3 mg/dL 9.1   8.8       Component Value Date/Time   IRON  27 (L) 09/19/2024 1430   TIBC 438 09/19/2024 1430   FERRITIN 63 09/19/2024 1430   IRONPCTSAT 6 (L) 09/19/2024 1430     RADIOGRAPHIC STUDIES: I have personally reviewed the radiological images as listed and agreed with the findings in the report. No results found.

## 2024-09-20 NOTE — Telephone Encounter (Signed)
-----   Message from Zelphia Cap sent at 09/19/2024 10:09 PM EST ----- Please arrange her to get Venofer  weekly x 2 ----- Message ----- From: Rebecka, Lab In [place] Sent: 09/19/2024   2:53 PM EST To: Zelphia Cap, MD

## 2024-09-20 NOTE — Telephone Encounter (Signed)
 Spoke to pt and she is aware of plan for venofer  weekly x2.   Please call pt with appt details.

## 2024-09-26 ENCOUNTER — Inpatient Hospital Stay

## 2024-09-26 VITALS — BP 126/73 | HR 79 | Temp 97.9°F | Resp 18

## 2024-09-26 DIAGNOSIS — D500 Iron deficiency anemia secondary to blood loss (chronic): Secondary | ICD-10-CM

## 2024-09-26 DIAGNOSIS — D509 Iron deficiency anemia, unspecified: Secondary | ICD-10-CM | POA: Diagnosis not present

## 2024-09-26 MED ORDER — IRON SUCROSE 20 MG/ML IV SOLN
200.0000 mg | Freq: Once | INTRAVENOUS | Status: AC
  Administered 2024-09-26: 200 mg via INTRAVENOUS
  Filled 2024-09-26: qty 10

## 2024-09-26 NOTE — Patient Instructions (Signed)

## 2024-10-03 ENCOUNTER — Inpatient Hospital Stay

## 2024-10-03 VITALS — BP 103/60 | HR 70 | Temp 98.8°F | Resp 18

## 2024-10-03 DIAGNOSIS — D509 Iron deficiency anemia, unspecified: Secondary | ICD-10-CM | POA: Diagnosis not present

## 2024-10-03 DIAGNOSIS — D500 Iron deficiency anemia secondary to blood loss (chronic): Secondary | ICD-10-CM

## 2024-10-03 MED ADMIN — Iron Sucrose Inj 20 MG/ML (Fe Equiv): 200 mg | INTRAVENOUS | @ 14:00:00 | NDC 00517231001

## 2024-10-03 MED FILL — Iron Sucrose Inj 20 MG/ML (Fe Equiv): 200.0000 mg | INTRAVENOUS | Qty: 10 | Status: AC

## 2024-10-03 NOTE — Patient Instructions (Signed)

## 2024-10-10 NOTE — Progress Notes (Signed)
 PROVIDER NOTE: Interpretation of information contained herein should be left to medically-trained personnel. Specific patient instructions are provided elsewhere under Patient Instructions section of medical record. This document was created in part using AI and STT-dictation technology, any transcriptional errors that may result from this process are unintentional.  Patient: Theresa [name]  Service: E/M   PCP: Theresa [name], Edra GRADE, MD  DOB: 14-Oct-1956  DOS: 10/14/2024  Provider: Emmy MARLA Blanch, NP  MRN: [id number]  Delivery: Face-to-face  Specialty: Interventional Pain Management  Type: Established Patient  Setting: Ambulatory outpatient facility  Specialty designation: 09  Referring Prov.: Theresa [name], Edra GRADE, MD  Location: Outpatient office facility       History of present illness (HPI) [name], a 68 y.o. year old female, is here today because of her low back pain. [name] primary complain today is Back Pain  Pertinent problems: Ms. Zahm has Neuritis or radiculitis due to rupture of lumbar intervertebral disc; DDD (degenerative disc disease), lumbar; Carpal tunnel syndrome; Chronic hypoxic respiratory failure (HCC); Chronic heart failure with preserved ejection fraction (HCC); Long term (current) use of opiate analgesic; Long term prescription opiate use; Neurogenic pain; Neuropathic pain; Musculoskeletal pain; Lumbar spondylosis (L4-5); Chronic low back pain (1ry area of Pain) (Bilateral) (R>L) w/o sciatica; Chronic lower extremity pain (2ry area of Pain) (Left); Chronic lumbar radicular pain (L4 & S1 Dermatome) (Left); Chronic pain syndrome (significant psychosocial component); Lumbar facet syndrome (Bilateral) (R>L); Chronic hip pain (3ry area of Pain) (Bilateral) (L>R); and Chronic lower extremity pain (Bilateral) (R>L) on their pertinent problem list.  Pain Assessment: Severity of Chronic pain is reported as a 8 /10. Location: Back Lower/Buttocks. Onset: More than  a month ago. Quality: Constant. Timing: Constant. Modifying factor(s): Denies. Vitals:  height is 5' 2 (1.575 m) and weight is 212 lb (96.2 kg). Her temporal temperature is 97.2 F (36.2 C) (abnormal). Her blood pressure is 102/60 and her pulse is 85. Her respiration is 18 and oxygen  saturation is 98%.  BMI: Estimated body mass index is 38.78 kg/m as calculated from the following:   Height as of this encounter: 5' 2 (1.575 m).   Weight as of this encounter: 212 lb (96.2 kg).  Last encounter: 07/22/2024. Last procedure: Visit date not found.  Reason for encounter: medication management.  The patient indicates doing well with current medication regimen.  No side effects or adverse reaction reported to medication.  Discussed the use of AI scribe software for clinical note transcription with the patient, who gave verbal consent to proceed.  History of Present Illness   Theresa [name] is a 68 year old female who presents with low back pain.  She experiences low back pain described as 'straight across, all the way across' the lower back, radiating to her buttocks. The pain has been persistent, and she has not received any recent injections due to personal circumstances, including the passing of her brother in October, which led to the cancellation of a scheduled injection. She previously received injections for her hip and shoulder in July 2025.   She is currently taking hydrocodone -acetaminophen  5-325 mg three times a day without any reported side effects or adverse reactions.     Pharmacotherapy Assessment   Hydrocodone -acetaminophen  (Norco/Vicodin) 5-325 mg tablet every 8 hours as needed for pain. MME=15 Monitoring: [place] PMP: PDMP reviewed during this encounter.       Pharmacotherapy: No side-effects or adverse reactions reported. Compliance: No problems identified. Effectiveness: Clinically acceptable.  Theresa [name],  CMA  10/14/2024 11:07 AM  Sign when Signing Visit Nursing Pain  Medication Assessment:  Safety precautions to be maintained throughout the outpatient stay will include: orient to surroundings, keep bed in low position, maintain call bell within reach at all times, provide assistance with transfer out of bed and ambulation.  Medication Inspection Compliance: Pill count conducted under aseptic conditions, in front of the patient. Neither the pills nor the bottle was removed from the patient's sight at any time. Once count was completed pills were immediately returned to the patient in their original bottle.  Medication: Hydrocodone /APAP Pill/Patch Count: 31 of 90 pills/patches remain Pill/Patch Appearance: Markings consistent with prescribed medication Bottle Appearance: Standard pharmacy container. Clearly labeled. Filled Date: 96 / 08 / 2025 Last Medication intake:  Yesterday    UDS:  Summary  Date Value Ref Range Status  10/25/2023 FINAL  Final    Comment:    ==================================================================== [employer] Select 13 (MW) ==================================================================== Test                             Result       Flag       Units  Drug Present and Declared for Prescription Verification   Hydrocodone                     1227         EXPECTED   ng/mg creat   Hydromorphone                   103          EXPECTED   ng/mg creat   Dihydrocodeine                 146          EXPECTED   ng/mg creat   Norhydrocodone                 518          EXPECTED   ng/mg creat    Sources of hydrocodone  include scheduled prescription medications.    Hydromorphone , dihydrocodeine and norhydrocodone are expected    metabolites of hydrocodone . Hydromorphone  and dihydrocodeine are    also available as scheduled prescription medications.  ==================================================================== Test                      Result    Flag   Units      Ref Range   Creatinine              67               mg/dL       >=79 ==================================================================== Declared Medications:  The flagging and interpretation on this report are based on the  following declared medications.  Unexpected results may arise from  inaccuracies in the declared medications.   **Note: The testing scope of this panel includes these medications:   Hydrocodone  (Norco)   **Note: The testing scope of this panel does not include the  following reported medications:   Acetaminophen  (Tylenol )  Acetaminophen  (Norco)  Albuterol  (Ventolin  HFA)  Albuterol  (Duoneb)  Amiodarone  (Pacerone )  Buspirone  (Buspar )  Cholecalciferol   Dapagliflozin  (Farxiga )  Diltiazem  (Cardizem )  Fluticasone   Furosemide   Ipratropium (Duoneb)  Melatonin  Naloxone  (Narcan )  Omeprazole  (Prilosec)  Polyethylene Glycol (MiraLAX )  Potassium (Klor-Con )  Roflumilast  (Daliresp )  Semaglutide  (Wegovy )  Sertraline  (Zoloft )  Spironolactone  (Aldactone )  Topical Diclofenac  (Voltaren )  Torsemide  (Demadex )  Umeclidinium  Vilanterol ==================================================================== For clinical consultation, please call [phone]. ====================================================================     No results found for: CBDTHCR No results found for: D8THCCBX No results found for: D9THCCBX  ROS  Constitutional: Denies any fever or chills Gastrointestinal: No reported hemesis, hematochezia, vomiting, or acute GI distress Musculoskeletal: low back pain Neurological: No reported episodes of acute onset apraxia, aphasia, dysarthria, agnosia, amnesia, paralysis, loss of coordination, or loss of consciousness  Medication Review  Cholecalciferol , Ensifentrine , Fluticasone -Umeclidin-Vilant, HYDROcodone -acetaminophen , Oxygen -Helium, acetaminophen , albuterol , amiodarone , aspirin  EC, atorvastatin , dapagliflozin  propanediol, diltiazem , ipratropium-albuterol , naloxone , omeprazole , potassium chloride   SA, pregabalin , roflumilast , sertraline , spironolactone , sulfamethoxazole -trimethoprim , tirzepatide , and torsemide   History Review  Allergy: Theresa [name] is allergic to gabapentin , codeine, and meloxicam . Drug: Ms. Redditt  reports no history of drug use. Alcohol:  reports no history of alcohol use. Tobacco:  reports that she has quit smoking. Her smoking use included cigarettes. She has a 66 pack-year smoking history. She has never used smokeless tobacco. Social: Theresa [name]  reports that she has quit smoking. Her smoking use included cigarettes. She has a 66 pack-year smoking history. She has never used smokeless tobacco. She reports that she does not drink alcohol and does not use drugs. Medical:  has a past medical history of Acute postoperative pain (10/03/2017), Anemia, Anxiety, Atrial fibrillation (HCC), BiPAP (biphasic positive airway pressure) dependence, Carpal tunnel syndrome, CHF (congestive heart failure) (HCC), CHF (congestive heart failure) (HCC), Chronic generalized abdominal pain, Chronic rhinitis, COPD (chronic obstructive pulmonary disease) (HCC), DDD (degenerative disc disease), cervical, DDD (degenerative disc disease), lumbosacral, Depression, Dyspnea, Edema, Flu, Gastritis, GERD (gastroesophageal reflux disease), Hematuria, Hernia of abdominal wall (07/17/2018), Hernia, abdominal, Hypertension, Kidney stones, Low back pain, Lumbar radiculitis, Malodorous urine, Muscle weakness, Obesity, Oxygen  dependent, Pneumonia due to COVID-19 virus (02/03/2020), Renal cyst, Sensory urge incontinence, Thyroid  activity decreased, Tobacco abuse, and Wheezing. Surgical: Theresa [name]  has a past surgical history that includes Abdominal hysterectomy; Tonsillectomy; Carpal tunnel release (Left, 2012); Tubal ligation; epigastric hernia repair (N/A, 08/13/2018); Umbilical hernia repair (N/A, 08/13/2018); RIGHT/LEFT HEART CATH AND CORONARY ANGIOGRAPHY (N/A, 07/11/2019); TEE without cardioversion (N/A,  04/29/2022); Cardioversion (N/A, 04/29/2022); Esophagogastroduodenoscopy (N/A, 06/21/2022); cataract surgery (Bilateral); Cardioversion (N/A, 01/30/2024); Cardioversion (N/A, 02/19/2024); Flexible sigmoidoscopy (N/A, 03/05/2024); Esophagogastroduodenoscopy (N/A, 03/05/2024); Colonoscopy (N/A, 03/05/2024); Polypectomy (03/05/2024); and RIGHT HEART CATH (Right, 05/01/2024). Family: family history includes Alcohol abuse in her father; Breast cancer in her sister; Cancer in her brother, brother, and sister; Coronary artery disease in her mother; Heart disease in her father and mother; Kidney cancer in her brother; Lung cancer in her sister; Pneumonia in her brother; Stroke in her mother.  Laboratory Chemistry Profile   Renal Lab Results  Component Value Date   BUN 28 (H) 06/09/2024   CREATININE 0.92 06/09/2024   BCR 20 06/05/2023   GFRAA >60 06/26/2020   GFRNONAA >60 06/09/2024    Hepatic Lab Results  Component Value Date   AST 29 06/04/2024   ALT 38 06/04/2024   ALBUMIN 3.4 (L) 06/04/2024   ALKPHOS 109 06/04/2024   LIPASE 57 (H) 06/19/2022   AMMONIA 40 (H) 10/11/2019    Electrolytes Lab Results  Component Value Date   NA 141 06/09/2024   K 4.3 06/09/2024   CL 97 (L) 06/09/2024   CALCIUM  9.1 06/09/2024   MG 2.6 (H) 06/09/2024   PHOS 3.2 06/21/2022    Bone Lab Results  Component Value Date   25OHVITD1 13 (L) 09/17/2015   25OHVITD2 <1.0 09/17/2015   25OHVITD3 13 09/17/2015  Inflammation (CRP: Acute Phase) (ESR: Chronic Phase) Lab Results  Component Value Date   CRP 0.9 04/02/2022   ESRSEDRATE 17 04/06/2022   LATICACIDVEN 1.1 06/04/2024         Note: Above Lab results reviewed.  Recent Imaging Review  US  RENAL CLINICAL DATA:  Flank pain.  EXAM: RENAL / URINARY TRACT ULTRASOUND COMPLETE  COMPARISON:  Ultrasound dated 09/08/2014.  FINDINGS: Right Kidney:  Renal measurements: 12.4 x 4.6 x 4.8 cm = volume: 142 mL. Normal echogenicity. No hydronephrosis or shadowing  stone. Small interpolar cyst.  Left Kidney:  Renal measurements: 13.1 x 6.0 x 4.3 cm = volume: 175 mL. Normal echogenicity. No hydronephrosis or shadowing stone.  Bladder:  Appears normal for degree of bladder distention.  Other:  None.  IMPRESSION: No hydronephrosis or shadowing stone.  Electronically Signed   By: Vanetta Chou M.D.   On: 06/08/2024 17:59 DG Chest 1 View CLINICAL DATA:  Shortness of breath.  COPD.  Atrial fibrillation.  EXAM: DG CHEST 1V  COMPARISON:  06/04/2024  FINDINGS: The heart size and mediastinal contours are within normal limits. Pulmonary hyperinflation again seen, consistent with COPD. No evidence of pulmonary infiltrate or pleural effusion.  IMPRESSION: COPD. No active disease.  Electronically Signed   By: Norleen DELENA Kil M.D.   On: 06/08/2024 16:31 Note: Reviewed        Physical Exam  Vitals: BP 102/60 (BP Location: Right Arm, Patient Position: Sitting, Cuff Size: Normal)   Pulse 85   Temp (!) 97.2 F (36.2 C) (Temporal)   Resp 18   Ht 5' 2 (1.575 m)   Wt 212 lb (96.2 kg)   SpO2 98%   PF (!) 5 L/min   BMI 38.78 kg/m  BMI: Estimated body mass index is 38.78 kg/m as calculated from the following:   Height as of this encounter: 5' 2 (1.575 m).   Weight as of this encounter: 212 lb (96.2 kg). Ideal: Ideal body weight: 50.1 kg (110 lb 7.2 oz) Adjusted ideal body weight: 68.5 kg (151 lb 1.1 oz) General appearance: Well nourished, well developed, and well hydrated. In no apparent acute distress Mental status: Alert, oriented x 3 (person, place, & time)       Respiratory: No evidence of acute respiratory distress Eyes: PERLA  Musculoskeletal: +LBP Assessment   Diagnosis Status  1. Chronic low back pain (1ry area of Pain) (Bilateral) (L>R) w/o sciatica   2. Chronic use of opiate for therapeutic purpose   3. Chronic lower extremity pain (2ry area of Pain) (Left)   4. Chronic hip pain (3ry area of Pain) (Bilateral) (L>R)    5. Lumbar facet syndrome (Bilateral) (L>R)   6. Chronic knee pain (Left)   7. Chronic lower extremity pain (Bilateral) (L>R)   8. Chronic pain syndrome (significant psychosocial component)    Controlled Controlled Controlled   Updated Problems: No problems updated.  Plan of Care  Problem-specific:  Assessment and Plan    Chronic low back pain Radiation to the buttocks. Managed with hydrocodone -acetaminophen  5-325 mg, taken three times daily without side effects. - Continue hydrocodone -acetaminophen  5-325 mg three times daily. - Renewed pain contract. - Offered injection therapy with Dr. Tanya when ready.  Chronic pain of both hips Chronic hip pain previously managed with injections in July. - Offered injection therapy with Dr. Tanya when ready.  Chronic pain syndrome Managed with hydrocodone -acetaminophen . No adverse reactions reported. - Continue current pain management regimen.   Chronic use of opioid for therapeutic purpose: Patient's  pain is controlled with hydrocodone -acetaminophen  (Norco) 5-325 mg, will continue on current medication regimen.  Prescribing drug monitoring (PDMP) reviewed, findings consistent with the use of prescribed medication and no evidence of narcotic misuse or abuse.  Routine UDS ordered today.  No side effects or adverse reaction reported to medication.  Schedule follow-up in 90 days for medication management.      Ms. JIMESHA [name] has a current medication list which includes the following long-term medication(s): albuterol , amiodarone , ipratropium-albuterol , potassium chloride  sa, sertraline , spironolactone , torsemide , [START ON 10/23/2024] hydrocodone -acetaminophen , [START ON 11/22/2024] hydrocodone -acetaminophen , and [START ON 12/22/2024] hydrocodone -acetaminophen .  Pharmacotherapy (Medications Ordered): Meds ordered this encounter  Medications   HYDROcodone -acetaminophen  (NORCO/VICODIN) 5-325 MG tablet    Sig: Take 1 tablet by mouth every 8  (eight) hours as needed for severe pain (pain score 7-10). Must last 30 days    Dispense:  90 tablet    Refill:  0    DO NOT: delete (not duplicate); no partial-fill (will deny script to complete), no refill request (F/U required). DISPENSE: 1 day early if closed on fill date. WARN: No CNS-depressants within 8 hrs of med.   HYDROcodone -acetaminophen  (NORCO/VICODIN) 5-325 MG tablet    Sig: Take 1 tablet by mouth every 8 (eight) hours as needed for severe pain (pain score 7-10). Must last 30 days    Dispense:  90 tablet    Refill:  0    DO NOT: delete (not duplicate); no partial-fill (will deny script to complete), no refill request (F/U required). DISPENSE: 1 day early if closed on fill date. WARN: No CNS-depressants within 8 hrs of med.   HYDROcodone -acetaminophen  (NORCO/VICODIN) 5-325 MG tablet    Sig: Take 1 tablet by mouth every 8 (eight) hours as needed for severe pain (pain score 7-10). Must last 30 days    Dispense:  90 tablet    Refill:  0    DO NOT: delete (not duplicate); no partial-fill (will deny script to complete), no refill request (F/U required). DISPENSE: 1 day early if closed on fill date. WARN: No CNS-depressants within 8 hrs of med.   Orders:  Orders Placed This Encounter  Procedures   [employer] Select 13 (MW), Urine    Volume: 30 ml(s). Minimum 3 ml of urine is needed. Document temperature of fresh sample. Indications: Long term (current) use of opiate analgesic (S20.108)    Release to patient:   Immediate        Return in about 3 months (around 01/12/2025) for (F2F), (MM), Emmy Blanch NP.    Recent Visits Date Type Provider Dept  07/22/24 Office Visit Mahoganie Basher K, NP [facility]  Showing recent visits within past 90 days and meeting all other requirements Today's Visits Date Type Provider Dept  10/14/24 Office Visit Mindie Rawdon K, NP [facility]  Showing today's visits and meeting all other requirements Future Appointments Date Type  Provider Dept  01/06/25 Appointment Lynzee Lindquist K, NP [facility]  Showing future appointments within next 90 days and meeting all other requirements  I discussed the assessment and treatment plan with the patient. The patient was provided an opportunity to ask questions and all were answered. The patient agreed with the plan and demonstrated an understanding of the instructions.  Patient advised to call back or seek an in-person evaluation if the symptoms or condition worsens.  I personally spent a total of 30 minutes in the care of the patient today including preparing to see the patient, getting/reviewing separately obtained  history, performing a medically appropriate exam/evaluation, counseling and educating, placing orders, referring and communicating with other health care professionals, documenting clinical information in the EHR, independently interpreting results, communicating results, and coordinating care.   Note by: Drezden Seitzinger K Lorree Millar, NP (TTS and AI technology used. I apologize for any typographical errors that were not detected and corrected.) Date: 10/14/2024; Time: 11:58 AM

## 2024-10-14 ENCOUNTER — Ambulatory Visit: Attending: Nurse Practitioner | Admitting: Nurse Practitioner

## 2024-10-14 ENCOUNTER — Encounter: Payer: Self-pay | Admitting: Nurse Practitioner

## 2024-10-14 DIAGNOSIS — G8929 Other chronic pain: Secondary | ICD-10-CM | POA: Insufficient documentation

## 2024-10-14 DIAGNOSIS — M25562 Pain in left knee: Secondary | ICD-10-CM | POA: Insufficient documentation

## 2024-10-14 DIAGNOSIS — M79604 Pain in right leg: Secondary | ICD-10-CM | POA: Diagnosis not present

## 2024-10-14 DIAGNOSIS — Z79891 Long term (current) use of opiate analgesic: Secondary | ICD-10-CM | POA: Diagnosis not present

## 2024-10-14 DIAGNOSIS — M79605 Pain in left leg: Secondary | ICD-10-CM | POA: Insufficient documentation

## 2024-10-14 DIAGNOSIS — M25552 Pain in left hip: Secondary | ICD-10-CM | POA: Insufficient documentation

## 2024-10-14 DIAGNOSIS — M47816 Spondylosis without myelopathy or radiculopathy, lumbar region: Secondary | ICD-10-CM | POA: Insufficient documentation

## 2024-10-14 DIAGNOSIS — M545 Low back pain, unspecified: Secondary | ICD-10-CM | POA: Diagnosis not present

## 2024-10-14 DIAGNOSIS — G894 Chronic pain syndrome: Secondary | ICD-10-CM | POA: Insufficient documentation

## 2024-10-14 DIAGNOSIS — M25551 Pain in right hip: Secondary | ICD-10-CM | POA: Insufficient documentation

## 2024-10-14 MED ORDER — HYDROCODONE-ACETAMINOPHEN 5-325 MG PO TABS: 1.0000 | ORAL_TABLET | Freq: Three times a day (TID) | ORAL | 0 refills | Status: AC | PRN

## 2024-10-14 NOTE — Progress Notes (Signed)
 Nursing Pain Medication Assessment:  Safety precautions to be maintained throughout the outpatient stay will include: orient to surroundings, keep bed in low position, maintain call bell within reach at all times, provide assistance with transfer out of bed and ambulation.  Medication Inspection Compliance: Pill count conducted under aseptic conditions, in front of the patient. Neither the pills nor the bottle was removed from the patient's sight at any time. Once count was completed pills were immediately returned to the patient in their original bottle.  Medication: Hydrocodone /APAP Pill/Patch Count: 31 of 90 pills/patches remain Pill/Patch Appearance: Markings consistent with prescribed medication Bottle Appearance: Standard pharmacy container. Clearly labeled. Filled Date: 53 / 08 / 2025 Last Medication intake:  Yesterday

## 2024-10-14 NOTE — Patient Instructions (Signed)
 ______________________________________________________________________    Opioid Pain Medication Update  To: All patients taking opioid pain medications. (I.e.: hydrocodone , hydromorphone , oxycodone , oxymorphone, morphine , codeine, methadone, tapentadol, tramadol , buprenorphine, fentanyl , etc.)  Re: Updated review of side effects and adverse reactions of opioid analgesics, as well as new information about long term effects of this class of medications.  Direct risks of long-term opioid therapy are not limited to opioid addiction and overdose. Potential medical risks include serious fractures, breathing problems during sleep, hyperalgesia, immunosuppression, chronic constipation, bowel obstruction, myocardial infarction, and tooth decay secondary to xerostomia.  Unpredictable adverse effects that can occur even if you take your medication correctly: Cognitive impairment, respiratory depression, and death. Most people think that if they take their medication correctly, and as instructed, that they will be safe. Nothing could be farther from the truth. In reality, a significant amount of recorded deaths associated with the use of opioids has occurred in individuals that had taken the medication for a long time, and were taking their medication correctly. The following are examples of how this can happen: Patient taking his/her medication for a long time, as instructed, without any side effects, is given a certain antibiotic or another unrelated medication, which in turn triggers a Drug-to-drug interaction leading to disorientation, cognitive impairment, impaired reflexes, respiratory depression or an untoward event leading to serious bodily harm or injury, including death.  Patient taking his/her medication for a long time, as instructed, without any side effects, develops an acute impairment of liver and/or kidney function. This will lead to a rapid inability of the body to breakdown and eliminate  their pain medication, which will result in effects similar to an overdose, but with the same medicine and dose that they had always taken. This again may lead to disorientation, cognitive impairment, impaired reflexes, respiratory depression or an untoward event leading to serious bodily harm or injury, including death.  A similar problem will occur with patients as they grow older and their liver and kidney function begins to decrease as part of the aging process.  Background information: Historically, the original case for using long-term opioid therapy to treat chronic noncancer pain was based on safety assumptions that subsequent experience has called into question. In 1996, the American Pain Society and the American Academy of Pain Medicine issued a consensus statement supporting long-term opioid therapy. This statement acknowledged the dangers of opioid prescribing but concluded that the risk for addiction was low; respiratory depression induced by opioids was short-lived, occurred mainly in opioid-naive patients, and was antagonized by pain; tolerance was not a common problem; and efforts to control diversion should not constrain opioid prescribing. This has now proven to be wrong. Experience regarding the risks for opioid addiction, misuse, and overdose in community practice has failed to support these assumptions.  According to the Centers for Disease Control and Prevention, fatal overdoses involving opioid analgesics have increased sharply over the past decade. Currently, more than 96,700 people die from drug overdoses every year. Opioids are a factor in 7 out of every 10 overdose deaths. Deaths from drug overdose have surpassed motor vehicle accidents as the leading cause of death for individuals between the ages of 61 and 75.  Clinical data suggest that neuroendocrine dysfunction may be very common in both men and women, potentially causing hypogonadism, erectile dysfunction, infertility,  decreased libido, osteoporosis, and depression. Recent studies linked higher opioid dose to increased opioid-related mortality. Controlled observational studies reported that long-term opioid therapy may be associated with increased risk for cardiovascular events. Subsequent  meta-analysis concluded that the safety of long-term opioid therapy in elderly patients has not been proven.   Side Effects and adverse reactions: Common side effects: Drowsiness (sedation). Dizziness. Nausea and vomiting. Constipation. Physical dependence -- Dependence often manifests with withdrawal symptoms when opioids are discontinued or decreased. Tolerance -- As you take repeated doses of opioids, you require increased medication to experience the same effect of pain relief. Respiratory depression -- This can occur in healthy people, especially with higher doses. However, people with COPD, asthma or other lung conditions may be even more susceptible to fatal respiratory impairment.  Uncommon side effects: An increased sensitivity to feeling pain and extreme response to pain (hyperalgesia). Chronic use of opioids can lead to this. Delayed gastric emptying (the process by which the contents of your stomach are moved into your small intestine). Muscle rigidity. Immune system and hormonal dysfunction. Quick, involuntary muscle jerks (myoclonus). Arrhythmia. Itchy skin (pruritus). Dry mouth (xerostomia).  Long-term side effects: Chronic constipation. Sleep-disordered breathing (SDB). Increased risk of bone fractures. Hypothalamic-pituitary-adrenal dysregulation. Increased risk of overdose.  RISKS: Respiratory depression and death: Opioids increase the risk of respiratory depression and death.  Drug-to-drug interactions: Opioids are relatively contraindicated in combination with benzodiazepines, sleep inducers, and other central nervous system depressants. Other classes of medications (i.e.: certain antibiotics  and even over-the-counter medications) may also trigger or induce respiratory depression in some patients.  Medical conditions: Patients with pre-existing respiratory problems are at higher risk of respiratory failure and/or depression when in combination with opioid analgesics. Opioids are relatively contraindicated in some medical conditions such as central sleep apnea.   Fractures and Falls:  Opioids increase the risk and incidence of falls. This is of particular importance in elderly patients.  Endocrine System:  Long-term administration is associated with endocrine abnormalities (endocrinopathies). (Also known as Opioid-induced Endocrinopathy) Influences on both the hypothalamic-pituitary-adrenal axis?and the hypothalamic-pituitary-gonadal axis have been demonstrated with consequent hypogonadism and adrenal insufficiency in both sexes. Hypogonadism and decreased levels of dehydroepiandrosterone sulfate have been reported in men and women. Endocrine effects include: Amenorrhoea in women (abnormal absence of menstruation) Reduced libido in both sexes Decreased sexual function Erectile dysfunction in men Hypogonadisms (decreased testicular function with shrinkage of testicles) Infertility Depression and fatigue Loss of muscle mass Anxiety Depression Immune suppression Hyperalgesia Weight gain Anemia Osteoporosis Patients (particularly women of childbearing age) should avoid opioids. There is insufficient evidence to recommend routine monitoring of asymptomatic patients taking opioids in the long-term for hormonal deficiencies.  Immune System: Human studies have demonstrated that opioids have an immunomodulating effect. These effects are mediated via opioid receptors both on immune effector cells and in the central nervous system. Opioids have been demonstrated to have adverse effects on antimicrobial response and anti-tumour surveillance. Buprenorphine has been demonstrated to have  no impact on immune function.  Opioid Induced Hyperalgesia: Human studies have demonstrated that prolonged use of opioids can lead to a state of abnormal pain sensitivity, sometimes called opioid induced hyperalgesia (OIH). Opioid induced hyperalgesia is not usually seen in the absence of tolerance to opioid analgesia. Clinically, hyperalgesia may be diagnosed if the patient on long-term opioid therapy presents with increased pain. This might be qualitatively and anatomically distinct from pain related to disease progression or to breakthrough pain resulting from development of opioid tolerance. Pain associated with hyperalgesia tends to be more diffuse than the pre-existing pain and less defined in quality. Management of opioid induced hyperalgesia requires opioid dose reduction.  Cancer: Chronic opioid therapy has been associated with an increased risk  of cancer among noncancer patients with chronic pain. This association was more evident in chronic strong opioid users. Chronic opioid consumption causes significant pathological changes in the small intestine and colon. Epidemiological studies have found that there is a link between opium dependence and initiation of gastrointestinal cancers. Cancer is the second leading cause of death after cardiovascular disease. Chronic use of opioids can cause multiple conditions such as GERD, immunosuppression and renal damage as well as carcinogenic effects, which are associated with the incidence of cancers.   Mortality: Long-term opioid use has been associated with increased mortality among patients with chronic non-cancer pain (CNCP).  Prescription of long-acting opioids for chronic noncancer pain was associated with a significantly increased risk of all-cause mortality, including deaths from causes other than overdose.  Reference: Von Korff M, Kolodny A, Deyo RA, Chou R. Long-term opioid therapy reconsidered. Ann Intern Med. 2011 Sep 6;155(5):325-8. doi:  10.7326/[id number]. PMID: 78106373; PMCID: [id number]. Kit JINNY Laurence CINDERELLA Pearley JINNY, Hayward RA, Dunn KM, Jordan KP. Risk of adverse events in patients prescribed long-term opioids: A cohort study in the [place] Clinical Practice Research [employer]. Eur J Pain. 2019 May;23(5):908-922. doi: 10.1002/ejp.1357. Epub 2019 Jan 31. PMID: 69379883. Colameco S, Coren JS, Ciervo CA. Continuous opioid treatment for chronic noncancer pain: a time for moderation in prescribing. Postgrad Med. 2009 Jul;121(4):61-6. doi: 10.3810/pgm.2009.07.2032. PMID: 80358728. Gigi JONELLE Shlomo MILUS Levern IVER Conny RN, [place], Blazina I, Lonell DASEN, Bougatsos C, Deyo RA. The effectiveness and risks of long-term opioid therapy for chronic pain: a systematic review for a [employer] of [employer] Pathways to [employer]. Ann Intern Med. 2015 Feb 17;162(4):276-86. doi: 10.7326/M14-2559. PMID: 74418742. Rory CHRISTELLA Laurence [facility], [place] DM. NCHS Data Brief No. 22. [place]: Centers for Disease Control and Prevention; 2009. Sep, Increase in Fatal Poisonings Involving Opioid Analgesics in the [place] , 1999-2006. Song IA, Choi HR, Oh TK. Long-term opioid use and mortality in patients with chronic non-cancer pain: Ten-year follow-up study in [place] from 2010 through 2019. EClinicalMedicine. 2022 Jul 18;51:101558. doi: 10.1016/j.eclinm.[id number]. PMID: 64124182; PMCID: [id number]. Huser, W., Schubert, T., Vogelmann, T. et al. All-cause mortality in patients with long-term opioid therapy compared with non-opioid analgesics for chronic non-cancer pain: a database study. BMC Med 18, 162 (2020). [email or web address].info/ Rashidian H, Zendehdel K, Kamangar F, Malekzadeh R, Haghdoost AA. An Ecological Study of the Association between Opiate Use and Incidence of Cancers. Addict Health. 2016 Fall;8(4):252-260. PMID: 71180443; PMCID: [id number].  Our Goal: Our goal is to control your pain with means other  than the use of opioid pain medications.  Our Recommendation: Talk to your physician about coming off of these medications. We can assist you with the tapering down and stopping these medicines. Based on the new information, even if you cannot completely stop the medication, a decrease in the dose may be associated with a lesser risk. Ask for other means of controlling the pain. Decrease or eliminate those factors that significantly contribute to your pain such as smoking, obesity, and a diet heavily tilted towards inflammatory nutrients.  Last Updated: 04/24/2023   ______________________________________________________________________       ______________________________________________________________________    Update on Controlled Substance (Opioid) Regulations   To: All patients taking opioid pain medications. (I.e.: hydrocodone , hydromorphone, oxycodone , oxymorphone, morphine, codeine, methadone, tapentadol, tramadol, buprenorphine, fentanyl , etc.)  Re: Review on the state of controlled substance regulations.  Introduction: Rules and regulations associated with all aspects of controlled substances are constantly being modified. Unfortunately we have encountered patients questioning the veracity of the information  that we provide them about these changes. This is intended to provide them with appropriate references and a historical review of these changes.  A Brief History: As of August 01, 2016, the [place]  Government declared the opioid epidemic a public health emergency. Prescription drug monitoring programs (PDMPs) and the [facility] All Schedules Prescription Electronic Reporting Act (NASPER). Before 1800, clinicians regarded pain as an existential phenomenon, a consequence of aging. There was no regulation on the use of cocaine and opioids, resulting in widespread marketing and prescribing for many ailments ranging from diarrhea to toothache. The [employer] of  [phone], passed in response to the sudden emergence of street heroin abuse as well as iatrogenic morphine dependence, influenced both physician and patient alike to avoid opiates. Patients with unexplained pain in the 1920s were regarded as deluded, malingering, or abusers, and cancer patients through the 1950s were encouraged to wean themselves off opioids until their lives could be measured in weeks. Alongside this opioid evolution, the American Pain Society launched their influential pain as the fifth vital sign campaign in 1995. Concurrently, pharmaceutical companies introduced new formulations, such as extended release oxycodone  (OxyContin ). From 1997 to 2002, OxyContin  prescriptions increased from 670,000 to 6.2 million. However, concerns soon began to surface regarding overzealous opioid treatment. It must be noted that pharmaceutical companies contributed significantly to the rise of the opioid epidemic, receiving considerable reprimands as a consequence. In 2007, as the opioid epidemic began to inflict profound damage, [employer] pleaded guilty to federal charges related to the misbranding of OxyContin . [employer] agreed to pay a total of $634.5 million to resolve Justice Department investigations, as well as a $19.5 million settlement to [address] and the [address] of [place].  In response to the current epidemic, changes in focus to the development of new abuse deterrent opioid formulations at the [place]  Food and Drug Administration (FDA) as well as drafting of new public standards for pain treatment were created at [facility] in 2017. In response to the opioid epidemic, FDA public policy changes were announced in February 2016. Among these new positions were a re-examination of the risk-benefit paradigm for opioids with strict emphasis on the large public health ramifications. The various modified opioids released over the past 20 years, such as tamper-resistant preparation, have had differing levels of success,  and are collectively referred to as Risk Evaluation and Mitigation Strategies (REMS). There is also a growing focus on preventing opioid use disorder (OUD) and on offering affected individuals accessible and effective treatment. [place]  government policy reflects these changes and both the Affordable Care Act and the Mental Health Parity and Addiction Equity Act were major steps forward in treating opioid addiction. The Affordable Care Act, which was signed into law in 2010, with major provisions coming into effect by 2014.  In the 1990s, the intensified marketing of newly reformulated prescription opioid medications (e.g., OxyContin ) and an influential pain advocacy campaign that encouraged greater pain management led to a precipitous rise in opioid use in the [place] . Research from the Centers for Disease Control and Prevention (CDC) shows that prescription opioid sales in the [place]  quadrupled from 1999 to 2010. At the same time, opioid misuse and opioid-involved overdose deaths increased (Figure 1). Between 1999 and 2010, the rate of opioidinvolved overdose deaths in the [place]  doubled from 2.9 to 6.8 deaths per 100,000 people. This initial rise in opioid-related deaths is often referred to as the first wave of the recent opioid crisis.  Between 1999 and 2020, 565,000  Americans died of opioid-involved overdoses. In turn, federal, state, and local governments responded with various legal and policy efforts to curb opioid misuse and drug-related overdose Deaths.  Recent Congresses have enacted several laws addressing the opioid crisis, such as the Comprehensive Addiction and Recovery Act of 2016 (CARA, P.L. [phone]); the 21st Century Cures Act (P.L. 114-255); the Substance UseDisorder Prevention that Promotes Opioid Recovery and Treatment for Patients and Communities Act (SUPPORT Act, P.L. [phone]); the Fentanyl  Sanctions Act (Title LXXII of P.L. [id number]); and the Blocking  Deadly Fentanyl  Imports Act (P.L. 117-81, 6610). These laws addressed overprescribing and misuse of opioids, expanded substance use disorder prevention and treatment capacities, bolstered drug diversion capabilities, and enhanced international drug interdiction, counternarcotics cooperation, and sanctions efforts. Congress also directed additional funds to many of these initiatives through appropriations.  Congress provided funding in the [employer] Act of 2021 [phone]; P.L. 117-2) for syringe services programs (often known as needle exchange programs) and other harm reduction initiatives. Federal and state harm reduction strategies have frequently involved the distribution of naloxone  (e.g., Narcan )--a medication used to reverse an opioid overdose--and test strips used to detect fentanyl  in drug samples.  The Department of Justice (DOJ) and Department of Homeland Security [facility]) aim to reduce the diversion of prescription opioids and the use, manufacturing, and trafficking of illicit opioids. DOJ--via the Drug Enforcement Administration (DEA)--regulates opioid manufacturers, distributors, and dispensers; it also controls the opioid supply through enforcement of regulatory requirements.  A History of Opiate Laws in the [place]   Prior to 1890, laws concerning opiates were strictly imposed on a local city or state-by-state basis. One of the first was in [place] in 1875 where it became illegal to smoke opium  only in opium  dens. It did not ban the sale, import or use otherwise. In the next 25 years different states enacted opium  laws ranging from outlawing opium  dens altogether to making possession of opium , morphine  and heroin without a physicians prescription illegal.  The first Congressional Act took place in 1890 that levied taxes on morphine  and opium . From that time on the [employer] has had a series of laws and acts directly aimed at opiate use, abuse  and control. These are outlined below:  1906 - Pure Food and Drug Act Preventing the manufacture, sale, or transportation of adulterated or misbranded or poisonous or deleterious foods, drugs, medicines, and liquors, and for regulating traffic therein, and for other purposes. Punishment included fines and prison time.  1909   - Smoking Opium  Exclusion Act Banned the importation, possession and use of smoking opium . Did not regulate opium -based medications. First Freight forwarder banning the non-medical use of a substance.  1914  - The Margrette Act In summary, The Margrette Act of 1914 was written more to have all parties involved in importing, exporting, set designer and distributing opium  or cocaine to register with the [employer] and have taxes levied upon them. Exempt from the law were physicians operating in the course of his professional practice  1919 - Supreme Court ratified the Bj's in [place] et [place]., v. [place]  and [place]  v. [place], then again in [facility] v. [place] , in 1920, holding that doctors may not prescribe maintenance supplies of narcotics to people addicted to narcotics. However, it does not prohibit doctors from prescribing narcotics to wean a patient off of the drug. It was also the opinion of the court that prescribing narcotics to habitual users was not considered professional practice hence it  then was considered illegal for doctors to prescribe opioids for the purposes of maintaining an addiction. It can be argued that todays addiction medications are not intended to maintain an addiction but to facilitate addiction remission. In which case, this opinion of the court should not preclude practitioners from prescribing buprenorphine or methadone to patients suffering from an addictive disorder.  1924  - Heroin Act Architectural technologist, importation and possession of heroin illegal - even for medicinal use.  1922 -- Narcotic  Drug Import and Export Act Enacted to assure proper control of importation, sale, possession, production and consumption of narcotics.  1927  -- Special Educational Needs Teacher of [employer] of Prohibition was responsible for tracking bootleggers and organized conservation officer, historic buildings. They focused primarily on interstate and international cases and those cases where local law enforcement official would not or could not act.  1932 -- Uniform State Narcotic Act Encouraged states to pass uniform state laws matching the federal Narcotic Drug Import and Export Act. Suggested prohibiting cannabis use at the state level.  49 -- Food, Drug, and Cosmetic Act The new law brought cosmetics and medical devices under control, and it required that drugs be labeled with adequate directions for safe use. Moreover, it mandated pre-market approval of all new drugs, such that a manufacturer would have to prove to FDA that a drug were safe before it could be sold  1951 -- Boggs Act Imposed maximum criminal penalties for violations of the import/export and internal revenue laws related to drugs and also established mandatory minimum prison sentences.  1956 -- Narcotics Control Act Increased Boggs Act penalties and mandatory prison sentence minimums for violations of existing drug laws.  1965 -- Drug Abuse Control Amendment Enacted to deal with problems caused by abuse of depressants, stimulants and hallucinogens. Restricted research into psychoactive drugs such as LSD by requiring FDA approval.  1970 -- Controlled Substance Act  Controlled Substances Import and Export Act These laws are a consolidation of numerous laws regulating the manufacture and distribution of narcotics, stimulants, depressants, hallucinogens, anabolic steroids, and chemicals used in the illicit production of controlled substances. The CSA places all substances that are regulated under existing federal law into one of five schedules. This placement is based upon  the substance's medicinal value, harmfulness, and potential for abuse or addiction. Schedule I is reserved for the most dangerous drugs that have no recognized medical use, while Schedule V is the classification used for the least dangerous drugs. The act also provides a mechanism for substances to be controlled, added to a schedule, decontrolled, removed from control, rescheduled, or transferred from one schedule to another.  78 - Drug Enforcement Agency By Executive Order, the DEA was formed to take place of the [employer] of Narcotics and Dangerous Drugs.  42 -Narcotic Addict Treatment Act of  1974  - Public Law [id number] Amends the Controlled Substance Act of 1970 to provide for the registration of practitioners conducting narcotic treatment programs. [methadone clinics] It also provides legal definitions for the phrases maintenance treatment and detoxification treatment.  1986 -- Anti-Drug Abuse Act of 1986 Strengthened Federal efforts to encourage foreign cooperation in eradicating illicit drug crops and in halting international drug traffic, to improve enforcement of Federal drug laws and enhance interdiction of illicit drug shipments, to provide strong Market researcher in establishing effective drug abuse prevention and education programs, to [employer] support for drug abuse treatment and rehabilitation efforts, and for other purposes. It also re-imposed mandatory sentencing minimums depending on which drug and how  much was involved.  1988 -- Anti-Drug Abuse Act of 1988 Established the Office of Materials Engineer (ONDCP) in the [employer] of the Economist; authorized funds for [employer], state and local drug enforcement activities, school-based drug prevention efforts, and drug abuse treatment with special emphasis on injecting drug abusers at high risk for AIDS.  2000 -- Federal - The Drug Addiction Treatment Act of 2000 (DATA 2000) It enables qualified physicians to  prescribe and/or dispense narcotics for the purpose of treating opioid dependency. For the first time, physicians are able to treat this disease from their private offices or other clinical settings. This presents a very desirable treatment option for those who are unwilling or unable to seek help in drug treatment clinics. Patients can now be treated in the privacy of their doctors office, as are other people being treated for any other type of medical condition. One medicine doctors may now prescribe is Buprenorphine. The major downfall of this Act is the limitation of 30 patients per practice - which means that large facilities, no matter how many physicians are there, can only treat 30 patients at a time.  2002-- DEA reschedules buprenorphine from a schedule V drug to a schedule III drug, on July 23, 2001 - the day before the FDA approval of Suboxone and Subutex despite overwhelming objection by the medical community.  2004: June 2004 THE CONFIDENTIALITY OF ALCOHOL  AND DRUG ABUSE PATIENT RECORDS REGULATION AND THE HIPAA PRIVACY RULE:  Confidentiality of Alcohol  and Drug Dependence Patient Records (summary) Code of Federal Regulations Title 42 Part 2 (42 CFR Part 2)  The confidentiality of alcohol  and drug dependence patient records maintained by this practice/program is protected by federal law and regulations. Generally, the practice/program may not say to a person outside the practice/program that a patient attends the practice/program, or disclose any information identifying a patient as being alcohol  or drug dependent unless:  The patient consents in writing; The disclosure is allowed by a court order, or The disclosure is made to medical personnel in a medical emergency or to qualified personnel for research,  audit, or practice/program evaluation. Violation of the federal law and regulations by a practice/program is a crime. Suspected violations may be reported to appropriate authorities  in accordance with federal regulations. Freight forwarder and regulations do not protect any information about a crime committed by a patient either at the practice/program or against any person who works for the practice/program or about any threat to commit such a crime. Federal laws and regulations do not protect any information about suspected child abuse or neglect from being reported under state law to appropriate state or local authorities.  sample consent form (MS-WORD)  2005: 05-18-2004 Public law [id number], Amends the Controlled Substances Act to eliminate the 30-patient limit for medical group practices allowed to dispense narcotic drugs in schedules III, IV, or V for maintenance or detoxification treatment (retains the 30-patient limit for an individual physician). This amendment removes the 30-patient limit on group medical practices that treat opioid dependence with buprenorphine. The restriction was part of the original Drug Addiction Treatment Act of 2000 (DATA) that allowed treatment of opioid dependence in a doctor's office. With this change, every certified doctor may now prescribe buprenorphine up to his or her individual physician limit of 30 patients.  2006: On 10/14/2005 President Levy signed Bill H.R.6344 into law. This allows physicians who have been certified to prescribe certain drugs for the treatment of opioid dependence under DATA2000 to treat up to  100 patients (up from 30) by submitting an intent notification to the Dept of Health and [employer]. This is a major step forward in both fighting the stigma and allowing access to treatment previously not available to some. For more details see 30/100-PATIENT LIMIT  2016: HHS augments regulations concerning the 30/100 patient limit by raising the limit to 275 for qualifying physicians. Link to summary of regulation  2016: Comprehensive Addiction and Recovery Act of 2016 (sec.303) amends the Controlled Substance Act - to allow Nurse  Practitioners and Physician Assistants to become eligible to prescribe buprenorphine for the treatment of opioid use disorder. See the entire law for more details.  The roots of the concurrent regulation of certain drugs under two statutory schemes go back to the beginning of this century. In 1906, Congress enacted the Pure Food and Drug Act, establishing one regime of regulation to assure (among other things) that drugs were not adulterated or misbranded. These regulations were amended several times, recodified in 1938, and expanded on again from the 1940s through the 1990s. Their implementation and enforcement is today assigned to the Food and Drug Administration (FDA) in the Department of Health and Human Services [facility]).  In 1914, Congress adopted the [place] Narcotic Act to stop abuse of addictive drugs. The [place] Narcotic Act was amended in 1937 to include marijuana. In 1965, amphetamines, barbiturates, and hallucinogens came under regulation, but under the [employer], Drug, and Cosmetic Act. In 1970, these various statutes were consolidated and recodified as the Controlled Substances Act (CSA), which has been amended several times since then. Its implementation and enforcement is today assigned to the Drug Enforcement Administration (DEA) in the Department of Justice.  The first clash occurred after World War I, when so-called morphine  clinics existed and physicians prescribed or dispensed morphine  to addicts. Some addicts were veterans of the American Civil War, the Spanish-American War, and WWI, who had become addicted during treatment for war wounds, but most of them came from the growing population of nonmedical addicts ([place], 8017). The Narcotics Division of the Prohibition Unit of the Department of the Treasury, which was then responsible for enforcing the [facility] Narcotic Act, concluded that this activity was not the legitimate practice of medicine but simple drug trafficking. The  Treasury Department swiftly closed the clinics and made it personally and professionally risky for physicians to maintain a narcotic addict for any reason. In did so, however, only after the American Medical Association had adopted a resolution, in 1920, opposing ambulatory clinics''.  In 1972, the public health establishment, including the Secretary of Health, Education, and Welfare, the Education Officer, Environmental, the [employer] of [employer], and the Chemical Engineer for Drug Abuse Prevention, was unprepared to allow [employer] of Narcotics and Dangerous Drugs, DEA's predecessor agency, to unilaterally define the parameters of medical practice for the use of methadone in the treatment of heroin addiction. As a consequence, a new set of rules--the third, on top of the FDA and DEA schemes--was added, one that inserted FDA deeply into the practice of medicine, notwithstanding its protestations to the contrary. Congress ratified this joint responsibility of law enforcement and public health officials for methadone through this third set of rules in 1974 with the passage of the Narcotic Addict Treatment Act (NATA). To examine in detail the evolution of this third set of rules--commonly referred to as the FDA or DHHS methadone regulations--we turn, first, to the period of the mid-1960s.  Increased use of heroin in the  post World War II period first became apparent in the early to mid 1950s. During the [employer], a minimum mandatory narcotics law was enacted in 1956, effective July 1957. 1962 [facility] conference on drug abuse, the [employer] on Narcotic and Drug Abuse (the [employer]) of 1963, the Drug Abuse Control Amendments of 1965, the President's Commission on [employer] and Administration of Justice (the [employer]) of [phone], and the Narcotic Addiction Rehabilitation Act of 1966.  The 1965 Drug Abuse Control Amendments  brought under strict federal control all nonnarcotic drugs capable of producing serious psychotoxic effects when abused. This act also created the [employer] of Drug Abuse Control within the Department of Health, Education, and Welfare (DHEW) and shifted the basis for aon corporation of illegal drugs from tax principles (administered by the Department of Treasury) to the regulation of commerce (administered by the [employer]).  The 1966 Narcotic Addiction Rehabilitation Act TOUR MANAGER) authorized the civil commitment of narcotic addicts, and federal assistance to state and local governments to develop a local system of drug treatment programs. With respect to the latter, the [employer] of Mental Health [facility]) initially proposed the gradual implementation of the state assistance effort, mainly through a common mental health mechanism--inpatient treatment programs. However, because of a perceived pressing need, the courts began to commit addicts to these programs even before they were officially opened or staffed. The NARA legislation imposed the following contract requirements on treatment centers: (1) thrice-a-week counseling sessions; (2) weekly urine tests; (3) restorative dental services; (4) psychological consultations and vocational training; and (5) the treatment modalities of drug-free outpatient, therapeutic community, and methadone maintenance. Reorganization Plan No. 1 of 1968 transferred the primary functions of the [employer] of Narcotics (FBN) from the [employer] to the Department of Justice; it also transferred the [employer] of Drug Abuse Control functions to the Department of Justice. Within the [employer], the [employer] of Narcotics and Dangerous Drugs (BNDD) was created, which became the Drug Enforcement Administration in 1973.   Under the first [place] administration [phone]), federal drug abuse policy developed in a significant way. These developments included a 1969 war  on drugs presidential message, resulting legislation in 1970, and a Special Action Office created by executive order in 1971 and authorized in statute in 1972. Brynn, in 1969, to send a message to Congress on drug abuse. Although this was the first time that a U.S. president invoked the war on drugs image, it was in retrospect the most balanced approach to the problem of drug abuse that had been advanced. The 1969 message resulted in the submission of legislation to the Congress and the passage, the following year, of the Comprehensive Drug Abuse Prevention and Control Act of 1970 [employer] [phone], August 12, 1969). The act dealt with research, treatment, and prevention of drug abuse and drug dependence, and with drug abuse charity fundraiser. One major purpose of the 1970 legislation was to reverse some of the strictures of the [employer] of 1914. The 1970 act sought to clarify for the medical profession . . . the extent to which they may safely go in treating narcotic addicts as patients. Title I, in Section IV, charged the Surveyor, Minerals, Education, and Welfare, to determine the appropriate methods of professional practice in the medical treatment of the narcotic addiction of various classes of narcotic addicts. This provision constitutes the initial statutory basis for treatment standards. The law enforcement sections consolidated all prior federal statutes into the  Controlled Substances Act and the Controlled Substances Export and Import Act (Titles II and III, respectively, of the Comprehensive Drug Abuse Prevention and Control Act of 1970). Under this legislation, substances were classified under five schedules according to their abuse potential, and psychological and physical effects. Methadone was placed in Schedule II, along with such opiate drugs as morphine, codeine, and hydrocodone .  One of the most important steps taken by President Brynn was to establish in June 1971 the  Special Action Office for Drug Abuse Prevention (SAODAP) in the [employer] of the President ([place] [phone], April 02, 1970). In mid-1971, [facility] appointed Dr. Maple Dunnings as SAODAP director. Within a year, the Drug Abuse Prevention Office and Treatment Act of 1972 [employer] [phone], January 05, 1971) gave statutory authority to [facility], but limiting setting, on April 15, 1974, as the limit on its existence.  The purpose of the 1972 act was to bring the resources of the federal government to bear on drug abuse with the immediate objective of significantly reducing its incidence and developing a comprehensive, coordinated long-term federal strategy to combat drug abuse.  Narcotic Addict Treatment Act [employer]) of 1974 [employer] [phone]), which amended the Controlled Substances Act. This legislation was driven by concern for the diversion of methadone to illicit channels that was occurring in 1972 and 1973, as reflected in the title of the Senate bill adopted on March 24, 1972, the Methadone Diversion Control Act of 1973. (U.S. Senate, 1970a, 8029a).  The 1980 final rule (45 FR 37305, July 06, 1979) reduced the minimum standard for admission from two years of addiction to one year coupled with a clinical determination that the individual was currently physiologically.  The regulations were next revised in 1989, following two proposals to modify them, one in 1983 and one in 1987.  Under President Tanda Corrente, a government-wide effort was made to review all federal government regulations and to eliminate or reduce the burden of these regulations on the private sector, state and nash-finch company, and wps resources.   The 1983 recommendations, though not adopted, did initiate another revision of the methadone regulations, which first found expression in a 1987 proposed rule (52 FR 37047, July 18, 1986) and culminated in a final rule (54 FR 8954, December 17, 1987) at the end of the  decade. In the 1987 proposed rule, the FDA and NIDA, in an effort to put the best face on the unenthusiastic 1983 response by the provider community to converting the regulations to guidelines, indicated that they had retained the current requirements necessary to achieve the goals of the 1974 NATA, but were proposing to streamline the regulation and to promote more efficient operation of methadone programs. The 1987 proposed rule, issued by the FDA and NIDA, advanced the following changes in the methadone regulation: that detoxification treatment be divided into short-term (<21 days) and long-term (>21 and <180 days) treatment; that the minimum staffing ratio of one counselor to 50 patients be eliminated; that blood tests be allowed as ways to conduct initial drug screening or to meet the monthly testing requirements for six-day take-home patients; that the 72-hour notification of FDA and the pertinent state authority for methadone doses greater than 100 mg be eliminated; that special adverse reaction reporting requirements for methadone be eliminated and reliance placed upon general FDA reporting requirements; that a supervising counselor be allowed to conduct the annual review of the patient's treatment plan for certain qualified patients who had been in treatment for 3 years  or longer; and that the requirement of an annual report of methadone treatment programs to the FDA be dropped. The FDA and NIDA issued a final rule on December 17, 1987, based on comments on the 1987 proposed (54 FR 8954). Concurrently, FDA and NIDA issued a six-page guidance document, which noted that the regulations, over time, had recommended certain practices that were not actually required. Public Health Service, in Congress, and elsewhere, to reorganize the Alcohol, Drug Abuse, and Mental Health Administration (ADAMHA). These efforts culminated in the [employer] of 1992 [employer] [phone], April 26, 1991), the main  purpose of which was to transfer the research portions of the three ADAMHA institutes--NIDA, the [employer] Mills of Alcoholism and Alcohol Abuse, and the [employer] of Mental Health--to the [employer] and to create the Substance Abuse and Museum/gallery Exhibitions Officer [facility]) as the home for the service functions of these entitles.  Guidelines for Opioid Treatment The Federal Guidelines for Opioid Treatment Programs - 2015 serve as a guide to accrediting organizations for developing accreditation standards. The guidelines also provide OTPs with information on how programs can achieve and maintain compliance with federal regulations. The 2015 guidelines are an update to the 2007 Guidelines for the Accreditation of Opioid Treatment Programs (PDF  547 KB). The new document reflects the obligation of OTPs to deliver care consistent with the patient-centered, integrated, and recovery-oriented standards of substance use treatment.  DPT oversees the certification of OTPs and provides guidance to nonprofit organizations and state governmental entities that want to become a SAMHSA-approved accrediting body. Learn more about the accreditation and certification of OTPs and [facility] oversight of OTP accreditation bodies.  Model Guidelines for [employer] With input from [facility], the Federation of [employer] in 2013 adopted a revised version of the federations office-based opioid treatment policies. The Model Policy on DATA 2000 and Treatment of Opioid Addiction in the Medical Office - 2013 (PDF  279 KB) provides model guidelines for use by state medical boards in regulating office-based opioid treatment.  Holiday Guidance for Opioid Treatment Programs (PDF  203 KB) In response to requests for the upcoming federal holidays and ensuing weekends (December 24th, 25th, and 26th and December 31st, Jan 1st, and Jan 2nd), this letter is to provide guidance  regarding requests for unsupervised doses of medication for patients for these dates. View a sample [id number] (PDF  194 KB).  Federal regulation of drugs emerged as early as 63, under a law that addressed only imported drugs. In 1905 the [employer] launched a private, voluntary means of controlling a substantial part of the drug marketplace, a system that remained in place for over a half-century. Drug regulation in FDA has evolved considerably since President Ricardo Para signed the 1906 Pure Food and Drugs Act.  1820 Eleven doctors set up the U.S. Pharmacopeia and record the first list of standard drugs. 1848 Drug Importation Act passed by Congress requires U.S. Customs Service inspection to stop entry of tainted, low quality drugs from overseas. 8116 Dr. Mitchell MICAEL Burrs becomes the chief chemist at the [facility] of [employer] adulteration studies.  1905 The American Medical Association [facility]) begins a voluntary program of drug approval that would last until 1955. In order to advertise in the [facility] and related journals, drug companies must show proof that the drug will treat what they claim. 1906 The original Food and Drug Act is passed by Congress on June 30 and signed by [employer].  The Act outlaws states from buying and selling food, drinks, and drugs that have been mislabeled and tainted. 1911 In U.S. v. Vicci, the [employer] that the [employer] and Drugs Act does not outlaw false medical claims but only false and misleading statements about the ingredients or identity of a drug. 1912 Congress passes the Romney Amendment to overcome the ruling in U.S. v. Vicci. The Act outlaws labeling medicines with fake medical claims that is meant to trick the buyer. 1930 The name of the Food, Drug, and Insecticide Administration is shortened to Food and Drug Administration (FDA) under an therapist, music. 1933 FDA  recommends a total rewrite of the out-of-date 1906 Food and Drugs Act.   1937 Elixir Sulfanilamide, contain the poisonous liquid, diethylene glycol, kills 107 persons, many of whom are children, dramatizing the need to establish drug safety before marketing and to pass the pending food and drug law. 1938 Congress passes [employer], Drug, and Cosmetic (FDC) Act of 1938, which requires that new drugs show safety before selling. This starts a new system of drug regulation. The Act also requires that safe limits be set for unavoidable poisonous matter and allows for factory inspections. The Directv is given power to oversee advertising for all FDAregulated products except prescription drugs. FDA states that sulfanilamide and other dangerous drugs must be given under the direction of a medical expert. This begins the requirement for prescription only (nonnarcotic) drugs (see 1951 Mer[place]-Humphrey amendment). 1941 Nearly 300 deaths and injuries result from the use of sulfathiazole tablets, an antibiotic, tainted with the sedative, phenobarbital. In response, FDA drastically changes manufacturing and quality controls. These changes lead to the development of good manufacturing practices (GMPs). 1948 The [employer] in U.S. v. Floretta that FDA jurisdiction extends to retail stores, thereby allowing FDA to stop illegal sales of drugs by pharmacies including barbiturates and amphetamines. 1950 In Walgreen. v. U.S., a U.S. Court of Appeals rules that the directions for use on a drug label must include the drugs purpose. 1951 Congress passes the Hayes-Humphrey Amendment, which defines the kinds of drugs that cannot be used safely without medical supervision. The amendment limits sale of these drugs to prescription only by a medical professional. All other drugs are to be available without a prescription. 1952 A nationwide investigation by FDA  reveals that chloramphenicol, an antibiotic, caused nearly 180 cases of often deadly blood diseases. Two years later FDA engages the [employer] of Hospital Pharmacists, the American Association of Medical Record Librarians, and later the American Medical Association in a voluntary program of drug reaction reporting. 1953 The [employer] Amendment clarifies previous law and requires FDA to give manufacturers written reports of conditions seen during inspections and results of factory samples. 1962 Thalidomide, a new sleeping pill, causes severe birth defects of the arms and legs in thousands of babies born in [place]. The U.S. media reports on how Dr. Cathlean Mort, a FDA medical officer, helped prevent approval and marketing of Thalidomide in the [place] . These reports stirred up public support for stronger drug laws. 3 Congress passes the [employer]. For the first time, these laws require drug makers to prove their drug works before FDA can approve them for sale. The Advisory Committee on Investigational Drugs meets for the first time. This was the first meeting of a committee to advise FDA on product approval and policy on an ongoing basis. 1966 FDA contracts with the [facility]  of [employer] to measure the effectiveness of 4,000 marketed drugs approved on the basis of safety alone between [id number] and 10/17/1961. The Fair Packaging and Labeling Act requires all consumer products, in interstate commerce, to be honestly and informatively labeled. 10/18/67 FDA forms the Drug Efficacy Study Implementation (DESI) to carry out recommendations of the [employer] of the effectiveness of drugs first sold between [place] and 01-Jan-1963Jan 01, 1971 FDA requires the first patient package insert, medicines must come with information for the patient about risks and benefits. 1972 Over-the-Counter Drug Review begins  to enhance the safety, effectiveness and appropriate labeling of drugs sold without prescription. 1973 The U.S. Supreme Court upholds the [place] drug effectiveness law and approves FDAs action to control entire classes of products. 1982 FDA issues Tamper-resistant Packaging Regulations to prevent poisonings such as deaths from cyanide placed in Tylenol  capsules. Congress passes the [employer] in 1982/10/17, making it a crime to tamper with packaged consumer products. 1983/10/18 Drug Price Advertising Account Planner Act (Hatch-Waxman Act) increases the availability of less costly generic drugs by allowing FDA to approve applications for generic versions of brand-name drugs without repeating the research that proved the safety and effectiveness of the brand-name drugs. The Act also allowed brand-name companies to apply for up to five years additional patent protection for the new medicines they developed to make up for time lost while their products were going through FDA's approval process. 1989 The FDA issued guidelines asking drug makers to decide if a drug is likely to have usefulness in elderly people and to include elderly people in studies when applicable. 1991 In 10/17/80, the FDA and the Department of Health and Human Services published a policy on protecting people in research. In 10/17/90, this policy is adopted by more than a dozen federal agencies involved in human subject research and becomes known as the Common Rule. 4 1993 FDA launches MedWatch, a system designed to collect reports from health professionals on problems with drugs and other medical products. FDA issues guidelines for measuring gender differences in responses to medication. Drug companies are encouraged to include patients of both sexes in their research of drugs and to study any gender-specific effects. 1995 FDA declares cigarettes to be drug delivery devices. Limits are issued on marketing and  sales to reduce smoking by young people. 1998 FDA introduces the Adverse Event Reporting System (AERS), a computerized database designed to store and study safety reports on already marketed drugs.  The Demographic Rule requires that a marketing application review data on safety and effectiveness by age, gender, and race. The Pediatric Rule requires drug makers of selected new and existing drugs to conduct studies on drug safety and effectiveness in children. 1999 Creation of the Drug Facts Label for OTC drug products. The law requires all overthe-counter drug labels to have information in a standard format. These drug facts labels are designed to give the user easy-to-find information. 2000 The U. S. [employer], upholds an earlier decision from The [employer] and Drug Administration v. [employer]. et al. and rules 5-4 that FDA does not have authority to regulate tobacco as a drug. 10-17-01 The Best Pharmaceuticals for Children Act, in exchange for studying the drug in children, the drug maker gets six months of selling their product without competition. 10/17/2002 The Pediatric Research Equity Act gives FDA the right to ask drug companies to study the effectiveness of new drugs in children. 2003/10/18 FDA advises medical professionals to  limit the use of a pain reliever called Cox-2, a nonsteroidal anti-inflammatory drug (NSAIDs). Studies had shown that long-term use raised chances of heart attacks and strokes. The warning is also added to the over-thecounter NSAIDs Drug Facts label. Medicines used in hospitals must have a bar code to prevent patients from receiving the wrong medicine. 5 2005 The Drug Safety Board is formed, consisting of FDA staff and representatives from the [employer] of [address] and the [employer]. The Board advises the Director, Center for Drug Evaluation and Research, FDA, on drug safety issues and works with the agency in sharing safety  information to health professionals and patients.  The [place]  Food and Drug Administration (FDA) was first created to enforce the Pure Food and Drug Act of 1906. In this capacity, the FDA is charged with protecting the health of the [place]  public, to ensure the quality of its food, medicine, and cosmetics. Before this time, the [place]  government had no formal oversight of these products and left issues of quality and purity to the individual manufactures, or at times, individual states.    Review: [place] Stop ACT. (The Strengthen Opioid Misuse Prevention (STOP) Act of 2017). GENERAL ASSEMBLY OF [place]  SESSION 2017 SESSION LAW 2017-74 HOUSE BILL 243  PMP mandatory The dispenser shall report: (1) The dispenser's DEA number. (2) The name of the patient for whom the controlled substance is being dispensed, and the patient's: a. Full address, including city, state, and zip code, b. Telephone number, and c. Date of birth. (3) The date the prescription was written. (4) The date the prescription was filled. (5) The prescription number. (6) Whether the prescription is new or a refill. (7) Metric quantity of the dispensed drug. (8) Estimated days of supply of dispensed drug, if provided to the dispenser. (9) National Drug Code of dispensed drug. (10) Prescriber's DEA number. (11) Method of payment for the prescription.  No paper prescriptions  Duration of scripts Acute vs Chronic prescribing  2016 CDC Guidelines for prescribing Opioids for Chronic Pain. (Updated in 2022.) Medical Board  Laws:  Prescription Laws Drug laws, rules, and regulations are constantly changing. Any attempt to summarize them would quickly become outdated. Because of that, the Board encourages practitioners who seek guidance on prescribing procedures to refer to the sources listed below in addition to the Boards position statements, rules and Medical Practice Act.  [place]  Board of Pharmacy  (NCBOP) (which offers the states pharmacy laws and rules, and links to the Code of Federal Regulations) [employer] Site: www.ncbop.org  [place]  General Statutes General Web Site: politicalpool.cz See: [place]  Food, Drug, and Cosmetic Act: S293155 & 106-134 See: [place]  Pharmacy Practice Act, Article 4A: [phone] See: [place]  Controlled Substances Act, Article 5: 90-86 & 90-113.8 See: Use of controlled substances to render one mentally incapacitated or physically helpless: [facility]. Code, Title 21, Food & Drugs www.deadiversion.usdoj.gov Controlled Substances Schedules www.deadiversion.usdoj.gov Drug Warehouse Manager - www.deadiversion.usdoj.gov 42 CFR  8.12 - Federal opioid treatment standards.   Effective June 12, 2016, prior approval will be required for opioid analgesic doses for [facility]. Medicaid and [place] [facility]) beneficiaries which:  Exceed 120 mg of morphine  equivalents (MME) per day  Are greater than a 14-day supply of any opioid, or,  Are non-preferred opioid products on the [place] Medicaid Preferred Drug List (PDL)  FEDERAL 42 CFR  8.12 - Federal opioid treatment standards. Title II of the Comprehensive Drug Abuse  Prevention and Control Act of 1970, commonly known as the Controlled Substance Act (CSA) Title 21 [place]  Code ([place]) Controlled Substances Act.   Reference:   ______________________________________________________________________       ______________________________________________________________________    Medication Rules  Purpose: To inform patients, and their family members, of our medication rules and regulations.  Applies to: All patients receiving prescriptions from our practice (written or electronic).  Pharmacy of record: This is the pharmacy where your electronic prescriptions will be sent. Make sure we have the correct one.  Electronic  prescriptions: In compliance with the [place]  Strengthen Opioid Misuse Prevention (STOP) Act of 2017 (Session Law 2017-74/H243), effective October 17, 2018, all controlled substances must be electronically prescribed. Written prescriptions, faxing, or calling prescriptions to a pharmacy will no longer be done.  Prescription refills: These will be provided only during in-person appointments. No medications will be renewed without a face-to-face evaluation with your provider. Applies to all prescriptions.  NOTE: The following applies primarily to controlled substances (Opioid* Pain Medications).   Type of encounter (visit): For patients receiving controlled substances, face-to-face visits are required. (Not an option and not up to the patient.)  Patient's Responsibilities: Pain Pills: Bring all pain pills to every appointment (except for procedure appointments). Pill counts are required.  Pill Bottles: Bring pills in original pharmacy bottle. Bring bottle, even if empty. Always bring the bottle of the most recent fill.  Medication refills: You are responsible for knowing and keeping track of what medications you are taking and when is it that you will need a refill. The day before your appointment: write a list of all prescriptions that need to be refilled. The day of the appointment: give the list to the admitting nurse. Prescriptions will be written only during appointments. No prescriptions will be written on procedure days. If you forget a medication: it will not be Called in, Faxed, or electronically sent. You will need to get another appointment to get these prescribed. No early refills. Do not call asking to have your prescription filled early. Partial  or short prescriptions: Occasionally your pharmacy may not have enough pills to fill your prescription.  NEVER ACCEPT a partial fill or a prescription that is short of the total amount of pills that you were prescribed.  With  controlled substances the law allows 72 hours for the pharmacy to complete the prescription.  If the prescription is not completed within 72 hours, the pharmacist will require a new prescription to be written. This means that you will be short on your medicine and we WILL NOT send another prescription to complete your original prescription.  Instead, request the pharmacy to send a carrier to a nearby branch to get enough medication to provide you with your full prescription. Prescription Accuracy: You are responsible for carefully inspecting your prescriptions before leaving our office. Have the discharge nurse carefully go over each prescription with you, before taking them home. Make sure that your name is accurately spelled, that your address is correct. Check the name and dose of your medication to make sure it is accurate. Check the number of pills, and the written instructions to make sure they are clear and accurate. Make sure that you are given enough medication to last until your next medication refill appointment. Taking Medication: Take medication as prescribed. When it comes to controlled substances, taking less pills or less frequently than prescribed is permitted and encouraged. Never take more pills than instructed. Never take the medication more frequently than  prescribed.  Inform other Doctors: Always inform, all of your healthcare providers, of all the medications you take. Pain Medication from other Providers: You are not allowed to accept any additional pain medication from any other Doctor or Healthcare provider. There are two exceptions to this rule. (see below) In the event that you require additional pain medication, you are responsible for notifying us , as stated below. Cough Medicine: Often these contain an opioid, such as codeine or hydrocodone . Never accept or take cough medicine containing these opioids if you are already taking an opioid* medication. The combination may cause  respiratory failure and death. Medication Agreement: You are responsible for carefully reading and following our Medication Agreement. This must be signed before receiving any prescriptions from our practice. Safely store a copy of your signed Agreement. Violations to the Agreement will result in no further prescriptions. (Additional copies of our Medication Agreement are available upon request.) Laws, Rules, & Regulations: All patients are expected to follow all [address] and [employer], Rules, [place] [employer]. Ignorance of the Laws does not constitute a valid excuse.  Illegal drugs and Controlled Substances: The use of illegal substances (including, but not limited to marijuana and its derivatives) and/or the illegal use of any controlled substances is strictly prohibited. Violation of this rule may result in the immediate and permanent discontinuation of any and all prescriptions being written by our practice. The use of any illegal substances is prohibited. Adopted CDC guidelines & recommendations: Target dosing levels will be at or below 60 MME/day. Use of benzodiazepines** is not recommended. Urine Drug testing: Patients taking controlled substances will be required to provide a urine sample upon request. Do not void before coming to your medication management appointments. Hold emptying your bladder until you are admitted. The admitting nurse will inform you if a sample is required. Our practice reserves the right to call you at any time to provide a sample. Once receiving the call, you have 24 hours to comply with request. Not providing a sample upon request may result in termination of medication therapy.  Exceptions: There are only two exceptions to the rule of not receiving pain medications from other Healthcare Providers. Exception #1 (Emergencies): In the event of an emergency (i.e.: accident requiring emergency care), you are allowed to receive additional pain medication. However, you are  responsible for: As soon as you are able, call our office [phone], at any time of the day or night, and leave a message stating your name, the date and nature of the emergency, and the name and dose of the medication prescribed. In the event that your call is answered by a member of our staff, make sure to document and save the date, time, and the name of the person that took your information.  Exception #2 (Planned Surgery): In the event that you are scheduled by another doctor or dentist to have any type of surgery or procedure, you are allowed (for a period no longer than 30 days), to receive additional pain medication, for the acute post-op pain. However, in this case, you are responsible for picking up a copy of our Post-op Pain Management for Surgeons handout, and giving it to your surgeon or dentist. This document is available at our office, and does not require an appointment to obtain it. Simply go to our office during business hours (Monday-Thursday from 8:00 AM to 4:00 PM) (Friday 8:00 AM to 12:00 Noon) or if you have a scheduled appointment with us , prior to your surgery,  and ask for it by name. In addition, you are responsible for: calling our office [phone], at any time of the day or night, and leaving a message stating your name, name of your surgeon, type of surgery, and date of procedure or surgery. Failure to comply with your responsibilities may result in termination of therapy involving the controlled substances.  Consequences:  Non-compliance with the above rules may result in permanent discontinuation of medication prescription therapy. All patients receiving any type of controlled substance is expected to comply with the above patient responsibilities. Not doing so may result in permanent discontinuation of medication prescription therapy. Medication Agreement Violation. Following the above rules, including your responsibilities will help you in avoiding a Medication  Agreement Violation (Breaking your Pain Medication Contract).  *Opioid medications include: morphine , codeine, oxycodone , oxymorphone, hydrocodone , hydromorphone , meperidine , tramadol , tapentadol, buprenorphine, fentanyl , methadone. **Benzodiazepine medications include: diazepam  (Valium ), alprazolam  (Xanax ), clonazepam (Klonopine), lorazepam (Ativan), clorazepate (Tranxene), chlordiazepoxide (Librium), estazolam (Prosom), oxazepam (Serax), temazepam (Restoril), triazolam (Halcion) (Last updated: 08/09/2023) ______________________________________________________________________     ______________________________________________________________________    Medication Recommendations and Reminders  Applies to: All patients receiving prescriptions (written and/or electronic).  Medication Rules & Regulations: You are responsible for reading, knowing, and following our Medication Rules document. These exist for your safety and that of others. They are not flexible and neither are we. Dismissing or ignoring them is an act of non-compliance that may result in complete and irreversible termination of such medication therapy. For safety reasons, non-compliance will not be tolerated. As with the U.S. fundamental legal principle of ignorance of the law is no defense, we will accept no excuses for not having read and knowing the content of documents provided to you by our practice.  Pharmacy of record:  Definition: This is the pharmacy where your electronic prescriptions will be sent.  We do not endorse any particular pharmacy. It is up to you and your insurance to decide what pharmacy to use.  We do not restrict you in your choice of pharmacy. However, once we write for your prescriptions, we will NOT be re-sending more prescriptions to fix restricted supply problems created by your pharmacy, or your insurance.  The pharmacy listed in the electronic medical record should be the one where you want  electronic prescriptions to be sent. If you choose to change pharmacy, simply notify our nursing staff. Changes will be made only during your regular appointments and not over the phone.  Recommendations: Keep all of your pain medications in a safe place, under lock and key, even if you live alone. We will NOT replace lost, stolen, or damaged medication. We do not accept Police Reports as proof of medications having been stolen. After you fill your prescription, take 1 week's worth of pills and put them away in a safe place. You should keep a separate, properly labeled bottle for this purpose. The remainder should be kept in the original bottle. Use this as your primary supply, until it runs out. Once it's gone, then you know that you have 1 week's worth of medicine, and it is time to come in for a prescription refill. If you do this correctly, it is unlikely that you will ever run out of medicine. To make sure that the above recommendation works, it is very important that you make sure your medication refill appointments are scheduled at least 1 week before you run out of medicine. To do this in an effective manner, make sure that you do not leave the office  without scheduling your next medication management appointment. Always ask the nursing staff to show you in your prescription , when your medication will be running out. Then arrange for the receptionist to get you a return appointment, at least 7 days before you run out of medicine. Do not wait until you have 1 or 2 pills left, to come in. This is very poor planning and does not take into consideration that we may need to cancel appointments due to bad weather, sickness, or emergencies affecting our staff. DO NOT ACCEPT A Partial Fill: If for any reason your pharmacy does not have enough pills/tablets to completely fill or refill your prescription, do not allow for a partial fill. The law allows the pharmacy to complete that prescription within 72  hours, without requiring a new prescription. If they do not fill the rest of your prescription within those 72 hours, you will need a separate prescription to fill the remaining amount, which we will NOT provide. If the reason for the partial fill is your insurance, you will need to talk to the pharmacist about payment alternatives for the remaining tablets, but again, DO NOT ACCEPT A PARTIAL FILL, unless you can trust your pharmacist to obtain the remainder of the pills within 72 hours.  Prescription refills and/or changes in medication(s):  Prescription refills, and/or changes in dose or medication, will be conducted only during scheduled medication management appointments. (Applies to both, written and electronic prescriptions.) No refills on procedure days. No medication will be changed or started on procedure days. No changes, adjustments, and/or refills will be conducted on a procedure day. Doing so will interfere with the diagnostic portion of the procedure. No phone refills. No medications will be called into the pharmacy. No Fax refills. No weekend refills. No Holliday refills. No after hours refills.  Remember:  Business hours are:  Monday to Thursday 8:00 AM to 4:00 PM Provider's Schedule: Eric Como, MD - Appointments are:  Medication management: Monday and Wednesday 8:00 AM to 4:00 PM Procedure day: Tuesday and Thursday 7:30 AM to 4:00 PM Wallie Sherry, MD - Appointments are:  Medication management: Tuesday and Thursday 8:00 AM to 4:00 PM Procedure day: Monday and Wednesday 7:30 AM to 4:00 PM (Last update: 08/09/2022) ______________________________________________________________________     ______________________________________________________________________    National Pain Medication Shortage  The [place] is experiencing worsening drug shortages. These have had a negative widespread effect on patient care and treatment. Not expected to improve any time soon.  Predicted to last past 2029.   Drug shortage list (generic names) Oxycodone  IR Oxycodone /APAP Oxymorphone IR Hydromorphone  Hydrocodone /APAP Morphine   Where is the problem?  Manufacturing and supply level.  Will this shortage affect you?  Only if you take any of the above pain medications.  How? You may be unable to fill your prescription.  Your pharmacist may offer a partial fill of your prescription. (Warning: Do not accept partial fills.) Prescriptions partially filled cannot be transferred to another pharmacy. Read our Medication Rules and Regulation. Depending on how much medicine you are dependent on, you may experience withdrawals when unable to get the medication.  Recommendations: Consider ending your dependence on opioid pain medications. Ask your pain specialist to assist you with the process. Consider switching to a medication currently not in shortage, such as Buprenorphine. Talk to your pain specialist about this option. Consider decreasing your pain medication requirements by managing tolerance thru Drug Holidays. This may help minimize withdrawals, should you run out of medicine. Control your pain  thru the use of non-pharmacological interventional therapies.   Your prescriber: Prescribers cannot be blamed for shortages. Medication manufacturing and supply issues cannot be fixed by the prescriber.   NOTE: The prescriber is not responsible for supplying the medication, or solving supply issues. Work with your pharmacist to solve it. The patient is responsible for the decision to take or continue taking the medication and for identifying and securing a legal supply source. By law, supplying the medication is the job and responsibility of the pharmacy. The prescriber is responsible for the evaluation, monitoring, and prescribing of these medications.   Prescribers will NOT: Re-issue prescriptions that have been partially filled. Re-issue prescriptions already sent to  a pharmacy.  Re-send prescriptions to a different pharmacy because yours did not have your medication. Ask pharmacist to order more medicine or transfer the prescription to another pharmacy. (Read below.)  New 2023 regulation: June 17, 2022 Revised Regulation Allows DEA-Registered Pharmacies to Transfer Electronic Prescriptions at a Patients Request DEA Headquarters Division - Public Information Office Patients now have the ability to request their electronic prescription be transferred to another pharmacy without having to go back to their practitioner to initiate the request. This revised regulation went into effect on Monday, June 13, 2022.     At a patients request, a DEA-registered retail pharmacy can now transfer an electronic prescription for a controlled substance (schedules II-V) to another DEA-registered retail pharmacy. Prior to this change, patients would have to go through their practitioner to cancel their prescription and have it re-issued to a different pharmacy. The process was taxing and time consuming for both patients and practitioners.    The Drug Enforcement Administration [facility]) published its intent to revise the process for transferring electronic prescriptions on September 04, 2020.  The final rule was published in the federal register on May 12, 2022 and went into effect 30 days later.  Under the final rule, a prescription can only be transferred once between pharmacies, and only if allowed under existing state or other applicable law. The prescription must remain in its electronic form; may not be altered in any way; and the transfer must be communicated directly between two licensed pharmacists. Its important to note, any authorized refills transfer with the original prescription, which means the entire prescription will be filled at the same pharmacy.   Reference: hugehand.is [facility] website announcement)  Cheapwipes.at.pdf [employer])   [employer] / Vol. 88, No. 143 / Thursday, May 12, 2022 / Rules and Regulations DEPARTMENT OF JUSTICE  Drug Enforcement Administration  21 CFR Part 1306  [Docket No. [id number]]  RIN [id number] Transfer of Electronic Prescriptions for Schedules II-V Controlled Substances Between Pharmacies for Initial Filling  ______________________________________________________________________       ______________________________________________________________________    Transfer of Pain Medication between Pharmacies  Re: 2023 DEA Clarification on existing regulation  Published on DEA Website: June 17, 2022  Title: Revised Regulation Allows DEA-Registered Pharmacies to Electrical Engineer Prescriptions at a Patients Request DEA Headquarters Division - [employer]  Patients now have the ability to request their electronic prescription be transferred to another pharmacy without having to go back to their practitioner to initiate the request. This revised regulation went into effect on Monday, June 13, 2022.     At a patients request, a DEA-registered retail pharmacy can now transfer an electronic prescription for a controlled substance (schedules II-V) to another DEA-registered retail pharmacy. Prior to this change, patients would have to go through their practitioner  to cancel their prescription and have it re-issued to a different pharmacy. The process was taxing and time consuming for both patients and practitioners.    The Drug Enforcement Administration [facility]) published its intent to revise the process for transferring electronic prescriptions on September 04, 2020.  The final rule was published in the  federal register on May 12, 2022 and went into effect 30 days later.  Under the final rule, a prescription can only be transferred once between pharmacies, and only if allowed under existing state or other applicable law. The prescription must remain in its electronic form; may not be altered in any way; and the transfer must be communicated directly between two licensed pharmacists. Its important to note, any authorized refills transfer with the original prescription, which means the entire prescription will be filled at the same pharmacy.    REFERENCES: 1. DEA website announcement hugehand.is  2. Department of Justice website  Cheapwipes.at.pdf  3. DEPARTMENT OF JUSTICE Drug Enforcement Administration 21 CFR Part 1306 [Docket No. [id number]] RIN [id number] Transfer of Electronic Prescriptions for Schedules II-V Controlled Substances Between Pharmacies for Initial Filling  ______________________________________________________________________       ______________________________________________________________________    Medication Transfer   Notification You are currently compliant and stable on your pain medication regimen. This regimen will be transferred today to your Primary Care Provider (PCP). You will be provided with enough prescriptions to last for 90 days. After that, your prescriptions will need to be taken over by your PCP.  Recommendation Immediately contact your primary care provider to secure an appointment for evaluation before this period is over. Do not wait until the last month to contact them.   Clarification The transfer of your medication regimen does not mean that you are being discharged from our clinic. We will remain available to you for any consultation or interventional therapies you may need.    Alternative Should you decide not to continue taking these medication and would like assistance in permanently stopping them, please let us  know so that we can design a slow tapering down of your regimen.  Reason Our primary responsibility to provide specialized interventional pain management therapies otherwise not available to the community. We have in the past assisted primary care providers with reviewing and adjusting pain medication management therapies, however, we have been transparent to all patients and referring providers that it is not our intention to permanently take over this type of therapy. Transfer of this portion of your care will assist us  in freeing time to assist others in need of our specialty services.   ______________________________________________________________________      ______________________________________________________________________    WARNING: CBD (cannabidiol) & Delta (Delta-8 tetrahydrocannabinol) products.   Applicable to:  All individuals currently taking or considering taking CBD (cannabidiol) and, more important, all patients taking opioid analgesic controlled substances (pain medication). (Example: oxycodone ; oxymorphone; hydrocodone ; hydromorphone; morphine; methadone; tramadol; tapentadol; fentanyl ; buprenorphine; butorphanol; dextromethorphan; meperidine; codeine; etc.)  Introduction:  Recently there has been a drive towards the use of natural products for the treatment of different conditions, including pain anxiety and sleep disorders. Marijuana and hemp are two varieties of the cannabis genus plants. Marijuana and its derivatives are illegal, while hemp and its derivatives are not. Cannabidiol (CBD) and tetrahydrocannabinol (THC), are two natural compounds found in plants of the Cannabis genus. They can both be extracted from hemp or marijuana. Both compounds interact with your bodys endocannabinoid system in very different ways. CBD is  associated with pain relief (analgesia) while THC  is associated with the psychoactive effects (the high) obtained from the use of marijuana products. There are two main types of THC: Delta-9, which comes from the marijuana plant and it is illegal, and Delta-8, which comes from the hemp plant, and it is legal. (Both, Delta-9-THC and Delta-8-THC are psychoactive and give you the high.)   Legality:  Marijuana and its derivatives: illegal Hemp and its derivatives: Legal (State dependent) UPDATE: (12/03/2021) The Drug Enforcement Agency (DEA) issued a letter stating that delta cannabinoids, including Delta-8-THCO and Delta-9-THCO, synthetically derived from hemp do not qualify as hemp and will be viewed as Schedule I drugs. (Schedule I drugs, substances, or chemicals are defined as drugs with no currently accepted medical use and a high potential for abuse. Some examples of Schedule I drugs are: heroin, lysergic acid diethylamide (LSD), marijuana (cannabis), 3,4-methylenedioxymethamphetamine (ecstasy), methaqualone, and peyote.) (cuetune.com.ee)  Legal status of CBD in [place]:  Conditionally Legal  Reference: FDA Regulation of Cannabis and Cannabis-Derived Products, Including Cannabidiol (CBD) - oemdeals.dk  Warning:  CBD is not FDA approved and has not undergo the same manufacturing controls as prescription drugs.  This means that the purity and safety of available CBD may be questionable. Most of the time, despite manufacturer's claims, it is contaminated with THC (delta-9-tetrahydrocannabinol - the chemical in marijuana responsible for the HIGH).  When this is the case, the [facility] contaminant will trigger a positive urine drug screen (UDS) test for Marijuana (carboxy-THC).   The FDA recently put out a warning about 5 things that everyone should be aware of regarding Delta-8  THC: Delta-8 THC products have not been evaluated or approved by the FDA for safe use and may be marketed in ways that put the public health at risk. The FDA has received adverse event reports involving delta-8 THC-containing products. Delta-8 THC has psychoactive and intoxicating effects. Delta-8 THC manufacturing often involve use of potentially harmful chemicals to create the concentrations of delta-8 THC claimed in the marketplace. The final delta-8 THC product may have potentially harmful by-products (contaminants) due to the chemicals used in the process. Manufacturing of delta-8 THC products may occur in uncontrolled or unsanitary settings, which may lead to the presence of unsafe contaminants or other potentially harmful substances. Delta-8 THC products should be kept out of the reach of children and pets.  NOTE: Because a positive UDS for any illicit substance is a violation of our medication agreement, your opioid analgesics (pain medicine) may be permanently discontinued.  MORE ABOUT CBD  General Information: CBD was discovered in [birth year] and it is a derivative of the cannabis sativa genus plants (Marijuana and Hemp). It is one of the 113 identified substances found in Marijuana. It accounts for up to 40% of the plant's extract. As of 2018, preliminary clinical studies on CBD included research for the treatment of anxiety, movement disorders, and pain. CBD is available and consumed in multiple forms, including inhalation of smoke or vapor, as an aerosol spray, and by mouth. It may be supplied as an oil containing CBD, capsules, dried cannabis, or as a liquid solution. CBD is thought not to be as psychoactive as THC (delta-9-tetrahydrocannabinol - the chemical in marijuana responsible for the HIGH). Studies suggest that CBD may interact with different biological target receptors in the body, including cannabinoid and other neurotransmitter receptors. As of 2018 the mechanism of action for its  biological effects has not been determined.  Side-effects  Adverse reactions: Dry mouth, diarrhea, decreased appetite, fatigue, drowsiness, malaise, weakness, sleep disturbances, and  others.  Drug interactions:  CBD may interact with medications such as blood-thinners. CBD causes drowsiness on its own and it will increase drowsiness caused by other medications, including antihistamines (such as Benadryl), benzodiazepines (Xanax , Ativan, Valium ), antipsychotics, antidepressants, opioids, alcohol and supplements such as kava, melatonin and St. John's Wort.  Other drug interactions: Brivaracetam (Briviact); Caffeine; Carbamazepine (Tegretol); Citalopram (Celexa); Clobazam (Onfi); Eslicarbazepine (Aptiom); Everolimus (Zostress); Lithium; Methadone (Dolophine); Rufinamide (Banzel); Sedative medications (CNS depressants); Sirolimus (Rapamune); Stiripentol (Diacomit); Tacrolimus (Prograf); Tamoxifen ; Soltamox); Topiramate  (Topamax ); Valproate; Warfarin (Coumadin); Zonisamide. (Last update: 09/26/2022) ______________________________________________________________________     ______________________________________________________________________    Muscle Spasms & Cramps  Cause(s):  Most common - vitamin and/or electrolyte (calcium , potassium, sodium, etc.) deficiencies. Post procedure - steroids (injected, oral, or inhaled) can make your kidneys excrete (loose) electrolytes. Most of the time this will not cause any symptoms however, if you happen to be borderline low on your electrolytes, it may temporarily triggering cramps & spasms.  Possible triggers: Sweating - causes loss of electrolytes thru the skin. Steroids - causes loss of electrolytes thru the urine.  Treatment: (over-the-counter)  Gatorade (or any other electrolyte-replenishing drink) - Take 1, 8 oz glass with each meal (3 times a day). Mechanism of action: Replenishes lost electrolytes. Magnesium  400 to 500 mg - Take 1 tablet twice a  day (one with breakfast and one at bedtime). If you have kidney disease talk to your primary care physician before taking any Magnesium . Mechanism of action: Magnesium  is a natural muscle relaxant. Tonic Water  with quinine - Take 1, 8 oz glass before bedtime.  Mechanism of action: Quinine is used to treat spasms.  Last Update: 04/27/2023  ______________________________________________________________________     ______________________________________________________________________    Appointment Information  It is our goal and responsibility to provide the medical community with assistance in the evaluation and management of patients with chronic pain. Unfortunately our resources are limited. Because we do not have an unlimited amount of time, or available appointments, we are required to closely monitor for unkept or cancelled appointments.  Patient's responsibilities: 1. Punctuality: Patients are required to be physically present in our office at least 15 minutes before their scheduled appointment. 2. Tardiness: Patients not physically present in our office at their scheduled appointment time will be rescheduled. 3. Plan ahead: Assume that you will encounter traffic and plan to arrive 30 minutes before your appointment. 4. Other appointments and responsibilities: Do not schedule other appointments immediately before or after your scheduled appointment.  5. Be prepared: Make a list of everything that you need to discuss with your provider so that you use your time efficiently. Once the provider leaves your room, he/she will not return to your room to discuss anything that you neglected to bring up during your allowed time. 6. No children or pets: Do not bring children or pets to your appointment. 7. Cancelling or rescheduling your appointment: Advanced notification (more than 24 hours in advance) is required. 8. No Show: Not calling to cancel an appointment and simply not showing up is  unacceptable. This leads to loss of appointments that could have been used by a patient in need. (See below)  Corrective process for repeat offenders:  No Shows: Three (3) No Shows within a 12 month period will result in an automatic discharge from our practice. Rescheduling or cancelling with more than 24 hours notice will not be penalized and will not count against you. Tardiness: If you have to be rescheduled three (3) times due to late arrivals,  it will be counted as one (1) No Show. Cancellation or reschedule: Three (3) cancellations or rescheduling where notice was given with less than 24 hours in advance, will be recorded as one (1) No Show.  Types of appointments: New patient initial evaluation: These are evaluations only. Your initial patient questionnaire will be collected and entered into the system. A history of present illness will be taken. Prior lab work, imaging studies, and associated treatments will be reviewed. The provider may order appropriate diagnostic testing depending on their evaluation and review of available information. No treatments will be started on this visit. 2nd Follow-up visit: During this visit your provider will inform you of the results of the diagnostic tests ordered on the initial evaluation. Based on the providers assessment, treatment options will be offered, at which the patient will decide if he/she is interested in the alternatives. If interested, a treatment plan will be established and started. Procedure visits: Post-procedure evaluation visits: Evaluation visits MM New problems Flare-up evaluations Follow-up after diagnostic testing ______________________________________________________________________

## 2024-10-17 LAB — TOXASSURE SELECT 13 (MW), URINE

## 2024-11-21 ENCOUNTER — Ambulatory Visit
Admission: RE | Admit: 2024-11-21 | Discharge: 2024-11-21 | Disposition: A | Source: Ambulatory Visit | Attending: Family Medicine | Admitting: Family Medicine

## 2024-11-21 DIAGNOSIS — Z1231 Encounter for screening mammogram for malignant neoplasm of breast: Secondary | ICD-10-CM

## 2025-01-06 ENCOUNTER — Encounter: Admitting: Nurse Practitioner

## 2025-03-20 ENCOUNTER — Inpatient Hospital Stay

## 2025-03-27 ENCOUNTER — Inpatient Hospital Stay

## 2025-03-27 ENCOUNTER — Inpatient Hospital Stay: Admitting: Oncology
